# Patient Record
Sex: Male | Born: 1970 | Race: White | Hispanic: No | Smoking: Former smoker
Health system: Southern US, Community
[De-identification: ages and names within clinical notes are randomized; demographics above are authoritative.]

## PROBLEM LIST (undated history)

## (undated) DIAGNOSIS — I4729 Other ventricular tachycardia: Secondary | ICD-10-CM

## (undated) DIAGNOSIS — N289 Disorder of kidney and ureter, unspecified: Secondary | ICD-10-CM

## (undated) DIAGNOSIS — I25118 Atherosclerotic heart disease of native coronary artery with other forms of angina pectoris: Secondary | ICD-10-CM

## (undated) DIAGNOSIS — R55 Syncope and collapse: Secondary | ICD-10-CM

## (undated) DIAGNOSIS — I517 Cardiomegaly: Secondary | ICD-10-CM

## (undated) DIAGNOSIS — Z8614 Personal history of Methicillin resistant Staphylococcus aureus infection: Secondary | ICD-10-CM

## (undated) DIAGNOSIS — K7581 Nonalcoholic steatohepatitis (NASH): Secondary | ICD-10-CM

## (undated) DIAGNOSIS — K7460 Unspecified cirrhosis of liver: Secondary | ICD-10-CM

## (undated) DIAGNOSIS — I10 Essential (primary) hypertension: Secondary | ICD-10-CM

## (undated) DIAGNOSIS — R0600 Dyspnea, unspecified: Secondary | ICD-10-CM

## (undated) DIAGNOSIS — R0789 Other chest pain: Secondary | ICD-10-CM

## (undated) DIAGNOSIS — Z720 Tobacco use: Secondary | ICD-10-CM

## (undated) DIAGNOSIS — E66811 Obesity, class 1: Secondary | ICD-10-CM

## (undated) DIAGNOSIS — J449 Chronic obstructive pulmonary disease, unspecified: Secondary | ICD-10-CM

## (undated) DIAGNOSIS — I509 Heart failure, unspecified: Secondary | ICD-10-CM

## (undated) DIAGNOSIS — G4733 Obstructive sleep apnea (adult) (pediatric): Secondary | ICD-10-CM

## (undated) DIAGNOSIS — Z7901 Long term (current) use of anticoagulants: Secondary | ICD-10-CM

## (undated) DIAGNOSIS — R590 Localized enlarged lymph nodes: Secondary | ICD-10-CM

## (undated) DIAGNOSIS — I472 Ventricular tachycardia: Secondary | ICD-10-CM

## (undated) DIAGNOSIS — R519 Headache, unspecified: Secondary | ICD-10-CM

## (undated) DIAGNOSIS — I272 Pulmonary hypertension, unspecified: Secondary | ICD-10-CM

## (undated) DIAGNOSIS — I5020 Unspecified systolic (congestive) heart failure: Secondary | ICD-10-CM

## (undated) DIAGNOSIS — I209 Angina pectoris, unspecified: Secondary | ICD-10-CM

## (undated) DIAGNOSIS — I639 Cerebral infarction, unspecified: Secondary | ICD-10-CM

## (undated) DIAGNOSIS — J189 Pneumonia, unspecified organism: Secondary | ICD-10-CM

## (undated) DIAGNOSIS — K219 Gastro-esophageal reflux disease without esophagitis: Secondary | ICD-10-CM

## (undated) DIAGNOSIS — Z79899 Other long term (current) drug therapy: Secondary | ICD-10-CM

## (undated) DIAGNOSIS — Z7960 Long term (current) use of unspecified immunomodulators and immunosuppressants: Secondary | ICD-10-CM

## (undated) DIAGNOSIS — N183 Chronic kidney disease, stage 3 unspecified: Secondary | ICD-10-CM

## (undated) DIAGNOSIS — I2510 Atherosclerotic heart disease of native coronary artery without angina pectoris: Secondary | ICD-10-CM

## (undated) DIAGNOSIS — Z7982 Long term (current) use of aspirin: Secondary | ICD-10-CM

## (undated) DIAGNOSIS — I219 Acute myocardial infarction, unspecified: Secondary | ICD-10-CM

## (undated) DIAGNOSIS — R778 Other specified abnormalities of plasma proteins: Secondary | ICD-10-CM

## (undated) DIAGNOSIS — M75102 Unspecified rotator cuff tear or rupture of left shoulder, not specified as traumatic: Secondary | ICD-10-CM

## (undated) DIAGNOSIS — I4891 Unspecified atrial fibrillation: Secondary | ICD-10-CM

## (undated) DIAGNOSIS — Z87442 Personal history of urinary calculi: Secondary | ICD-10-CM

## (undated) DIAGNOSIS — Z95810 Presence of automatic (implantable) cardiac defibrillator: Secondary | ICD-10-CM

## (undated) DIAGNOSIS — R7989 Other specified abnormal findings of blood chemistry: Secondary | ICD-10-CM

## (undated) DIAGNOSIS — M1A00X0 Idiopathic chronic gout, unspecified site, without tophus (tophi): Secondary | ICD-10-CM

## (undated) DIAGNOSIS — E119 Type 2 diabetes mellitus without complications: Secondary | ICD-10-CM

## (undated) DIAGNOSIS — I428 Other cardiomyopathies: Secondary | ICD-10-CM

## (undated) DIAGNOSIS — L409 Psoriasis, unspecified: Secondary | ICD-10-CM

## (undated) DIAGNOSIS — K7469 Other cirrhosis of liver: Secondary | ICD-10-CM

## (undated) DIAGNOSIS — I5022 Chronic systolic (congestive) heart failure: Secondary | ICD-10-CM

## (undated) DIAGNOSIS — D696 Thrombocytopenia, unspecified: Secondary | ICD-10-CM

## (undated) DIAGNOSIS — E785 Hyperlipidemia, unspecified: Secondary | ICD-10-CM

## (undated) DIAGNOSIS — I700 Atherosclerosis of aorta: Secondary | ICD-10-CM

## (undated) DIAGNOSIS — E669 Obesity, unspecified: Secondary | ICD-10-CM

## (undated) DIAGNOSIS — I6521 Occlusion and stenosis of right carotid artery: Secondary | ICD-10-CM

## (undated) DIAGNOSIS — D751 Secondary polycythemia: Secondary | ICD-10-CM

## (undated) DIAGNOSIS — I340 Nonrheumatic mitral (valve) insufficiency: Secondary | ICD-10-CM

## (undated) DIAGNOSIS — L4050 Arthropathic psoriasis, unspecified: Secondary | ICD-10-CM

## (undated) DIAGNOSIS — G8929 Other chronic pain: Secondary | ICD-10-CM

## (undated) DIAGNOSIS — F32A Depression, unspecified: Secondary | ICD-10-CM

## (undated) DIAGNOSIS — K635 Polyp of colon: Secondary | ICD-10-CM

## (undated) HISTORY — DX: Unspecified systolic (congestive) heart failure: I50.20

## (undated) HISTORY — PX: CARDIAC CATHETERIZATION: SHX172

## (undated) HISTORY — DX: Acute myocardial infarction, unspecified: I21.9

## (undated) HISTORY — PX: FRACTURE SURGERY: SHX138

## (undated) HISTORY — PX: FINGER FRACTURE SURGERY: SHX638

## (undated) HISTORY — PX: AMPUTATION: SHX166

## (undated) HISTORY — PX: OTHER SURGICAL HISTORY: SHX169

## (undated) HISTORY — PX: FINGER AMPUTATION: SHX636

---

## 2003-09-20 ENCOUNTER — Other Ambulatory Visit: Payer: Self-pay

## 2004-03-14 ENCOUNTER — Other Ambulatory Visit: Payer: Self-pay

## 2005-11-18 ENCOUNTER — Other Ambulatory Visit: Payer: Self-pay

## 2005-11-18 ENCOUNTER — Emergency Department: Payer: Self-pay | Admitting: General Practice

## 2007-03-17 ENCOUNTER — Emergency Department: Payer: Self-pay | Admitting: Emergency Medicine

## 2007-05-12 IMAGING — CT CT HEAD WITHOUT CONTRAST
2 series · 16 of 30 positions shown, 20 images · non-contrast
Comparison: none

REASON FOR EXAM: Headache/[HOSPITAL].
COMMENTS:

[Series 2: without · axial · non-contrast · 0.42mm/px · z∈[+1155,+1290]mm · 13 of 33 slices shown, 17 images]
[im 3/33  brain]
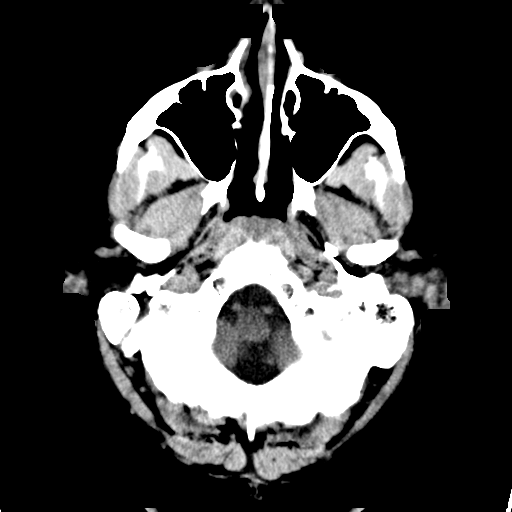
[im 3/33  bone]
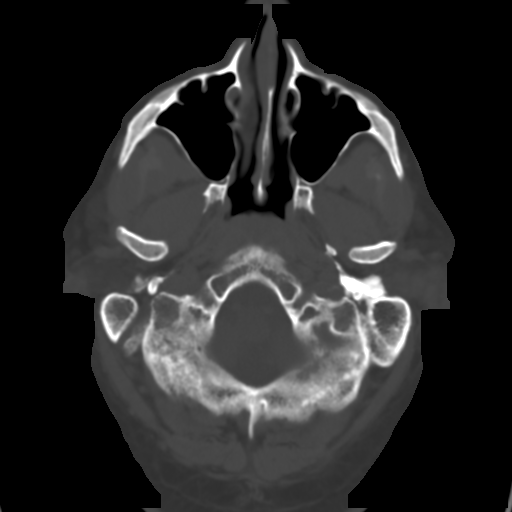
[im 5/33  brain]
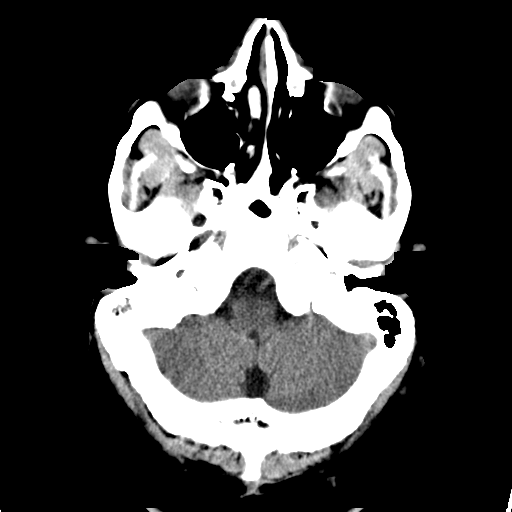
[im 7/33  brain]
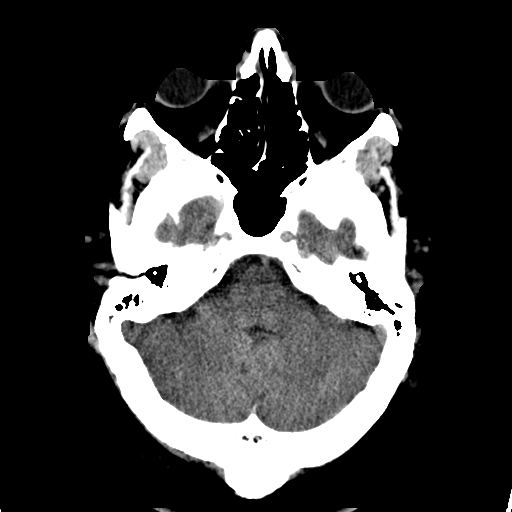
[im 10/33  brain]
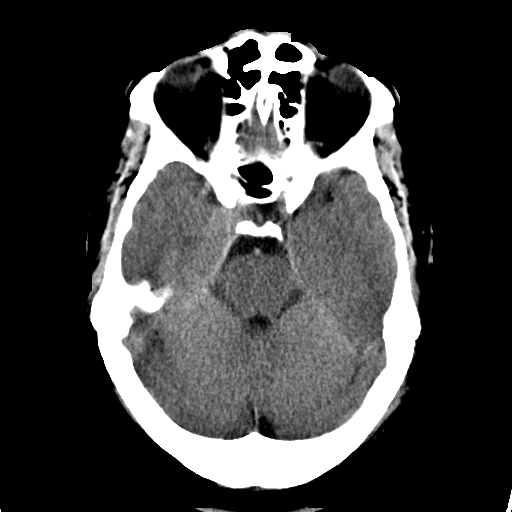
[im 12/33  brain]
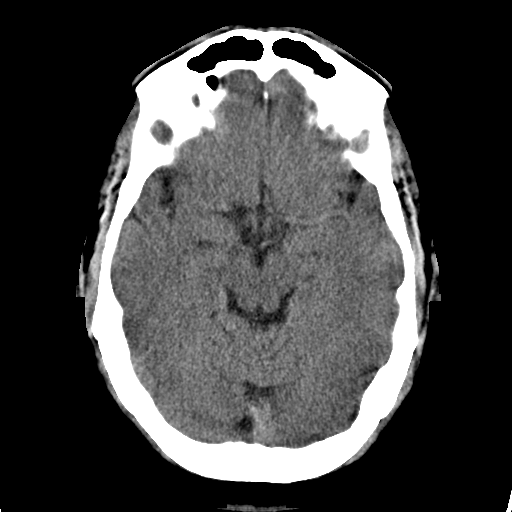
[im 12/33  bone]
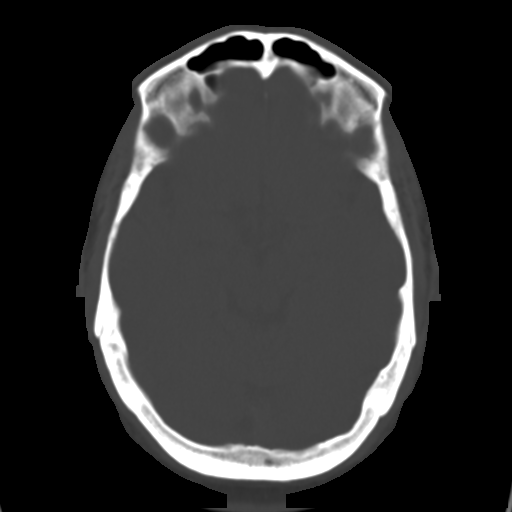
[im 14/33  brain]
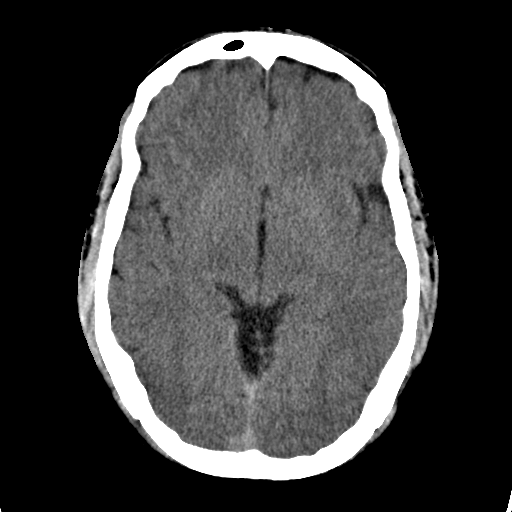
[im 17/33  brain]
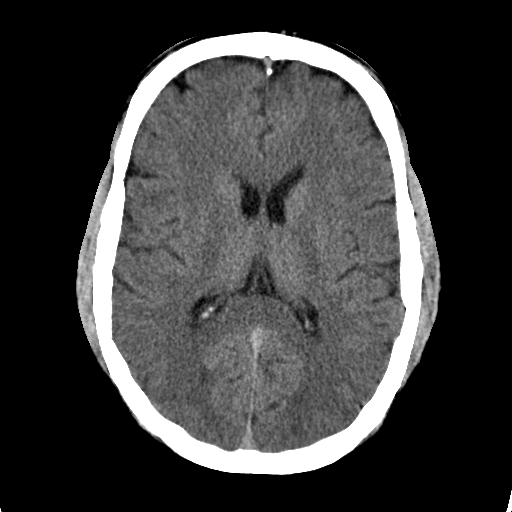
[im 19/33  brain]
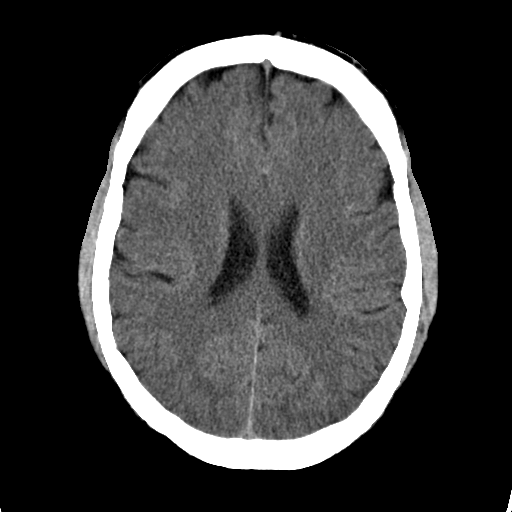
[im 21/33  brain]
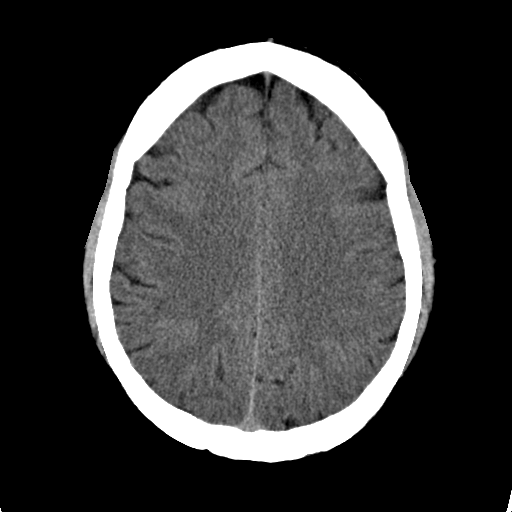
[im 21/33  bone]
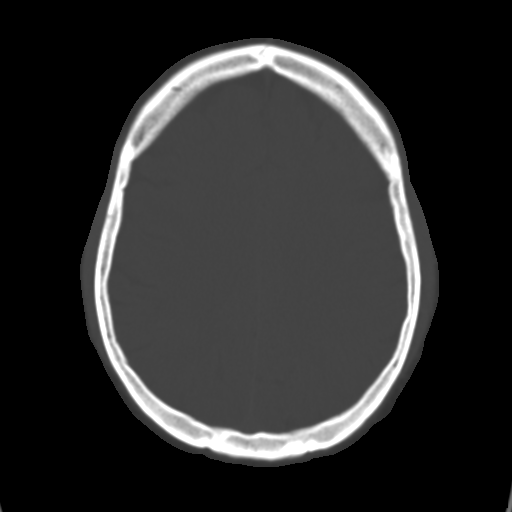
[im 23/33  brain]
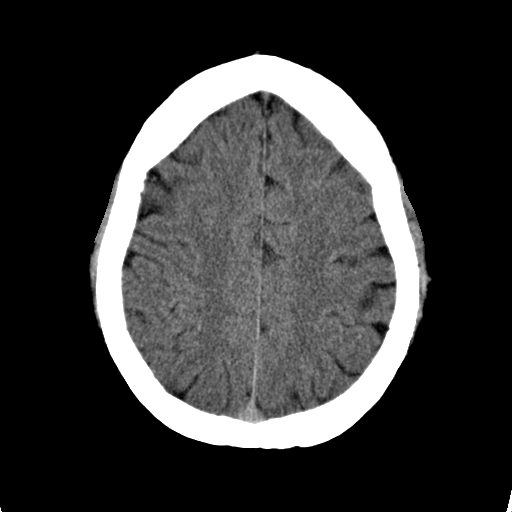
[im 26/33  brain]
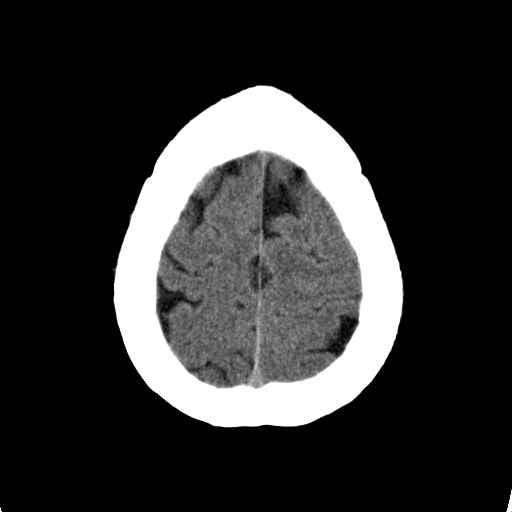
[im 28/33  brain]
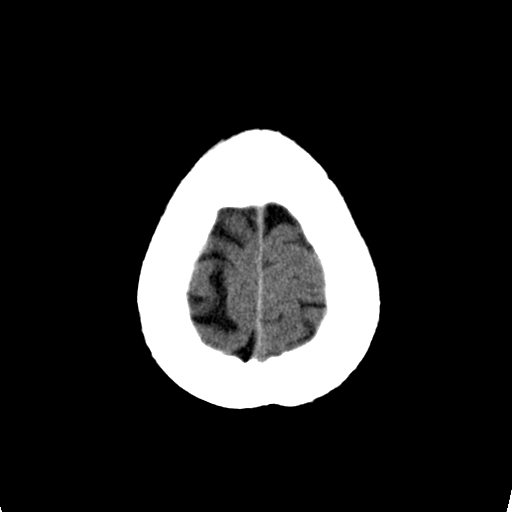
[im 30/33  brain]
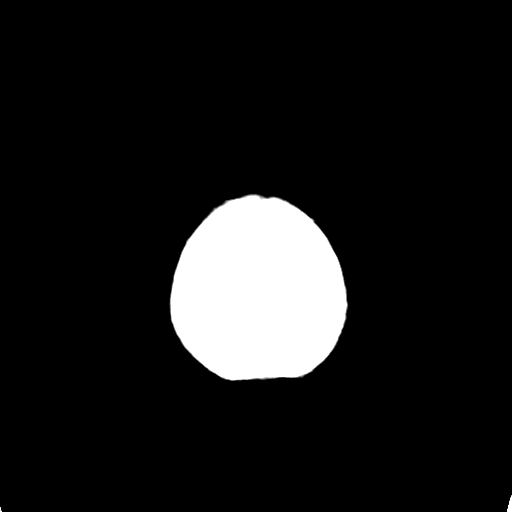
[im 30/33  bone]
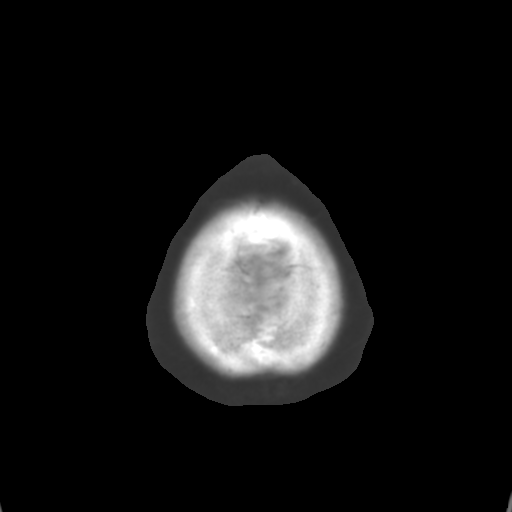

[Series 3: bone · axial · 0.42mm/px · z∈[+1155,+1200]mm · 3 of 33 slices shown]
[im 3/33  bone]
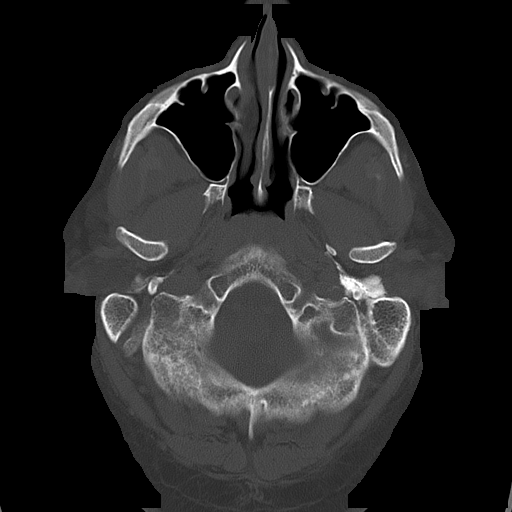
[im 7/33  bone]
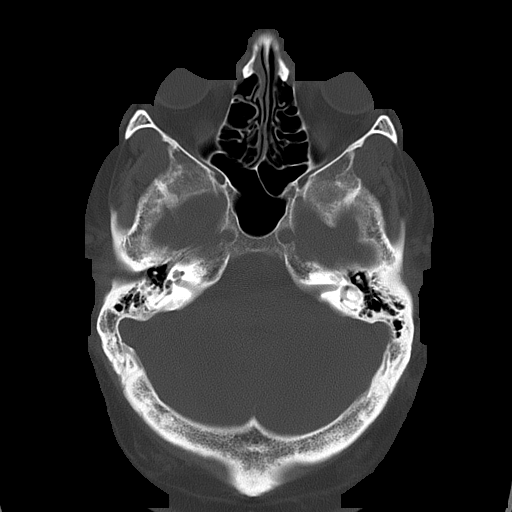
[im 12/33  bone]
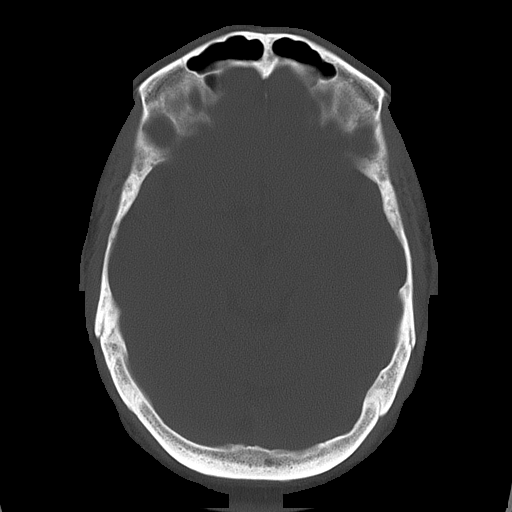

[16 of 30 positions shown; findings below may reference images not displayed]

PROCEDURE:     CT  - CT HEAD WITHOUT CONTRAST  - November 18, 2005 [DATE]

RESULT:        The patient is complaining of headache with nausea.

The ventricles are normal in size and position.   There is no evidence of a
mass nor mass effect.  There is no evidence of an intracranial hemorrhage.
There is soft tissue density in the RIGHT maxillary sinus most compatible
with a retention cyst.
IMPRESSION: I see no acute intracranial abnormality.

A preliminary report was sent to the Emergency Room at the conclusion of the
study.

## 2007-10-13 ENCOUNTER — Other Ambulatory Visit: Payer: Self-pay

## 2007-10-13 ENCOUNTER — Inpatient Hospital Stay: Payer: Self-pay | Admitting: Internal Medicine

## 2007-10-14 ENCOUNTER — Ambulatory Visit: Payer: Self-pay | Admitting: Cardiology

## 2007-10-15 ENCOUNTER — Other Ambulatory Visit: Payer: Self-pay

## 2008-04-09 ENCOUNTER — Emergency Department: Payer: Self-pay | Admitting: Internal Medicine

## 2008-08-03 ENCOUNTER — Emergency Department: Payer: Self-pay | Admitting: Emergency Medicine

## 2008-08-12 ENCOUNTER — Emergency Department: Payer: Self-pay

## 2008-09-08 IMAGING — CT CT STONE STUDY
1 of 2 series · 15 of 32 positions shown, 19 images · non-contrast
Comparison: none

REASON FOR EXAM: rm 16    abd pain/stone study
COMMENTS:

[Series 2: stone · axial · 0.93mm/px · z∈[-558,-78]mm · 15 of 174 slices shown, 19 images]
[im 7/174  soft-tissue]
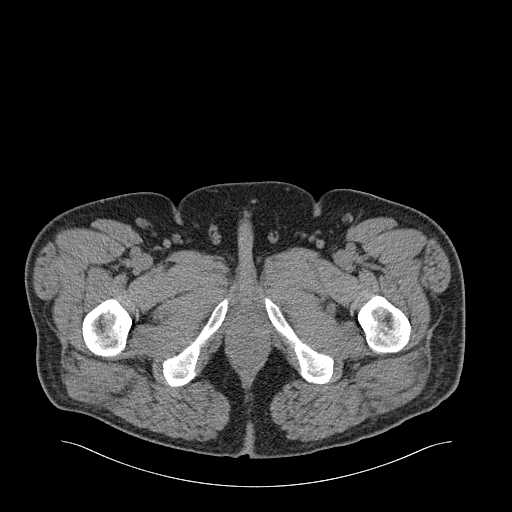
[im 7/174  bone]
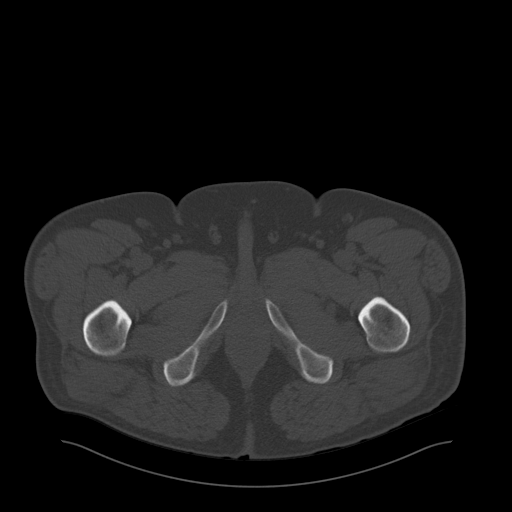
[im 21/174  soft-tissue]
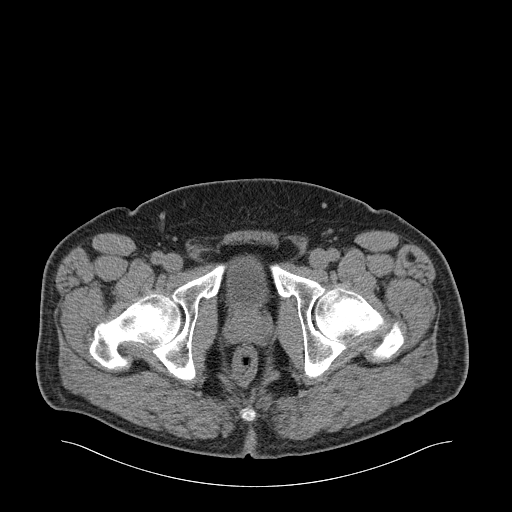
[im 35/174  soft-tissue]
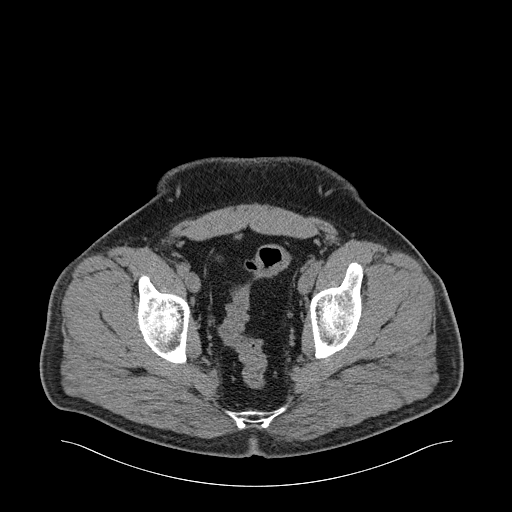
[im 49/174  soft-tissue]
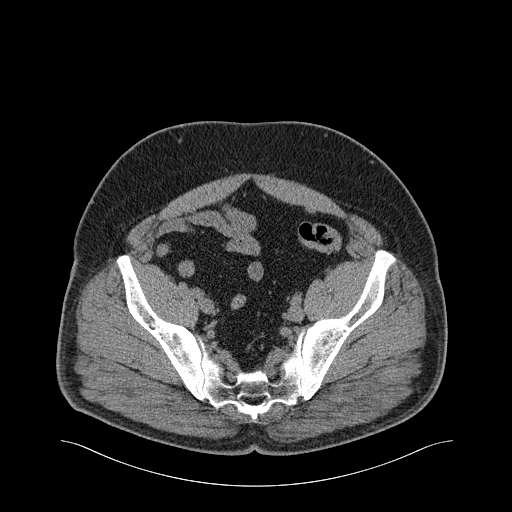
[im 63/174  soft-tissue]
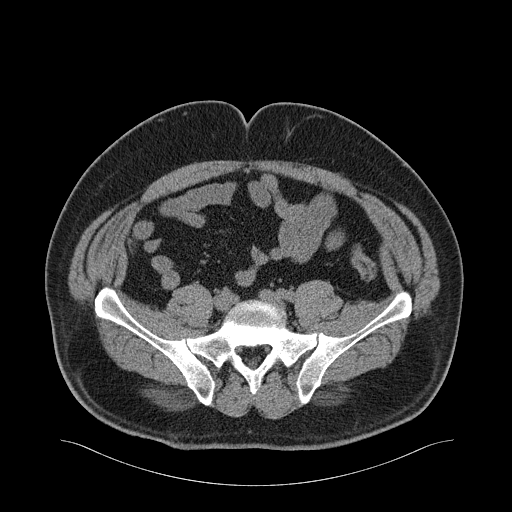
[im 77/174  soft-tissue]
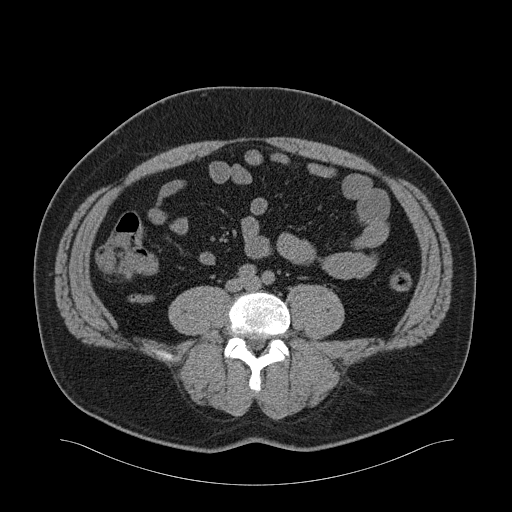
[im 90/174  soft-tissue]
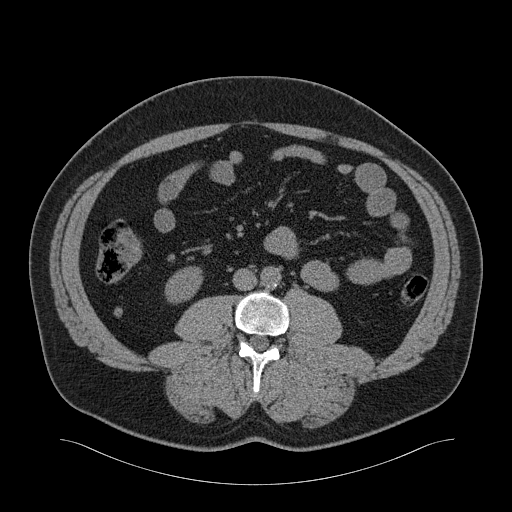
[im 97/174  soft-tissue]
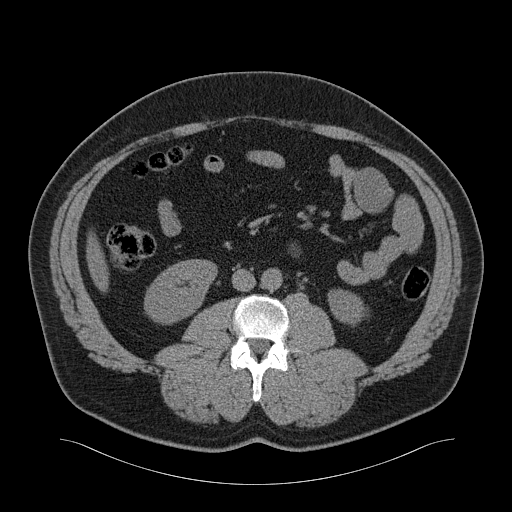
[im 111/174  soft-tissue]
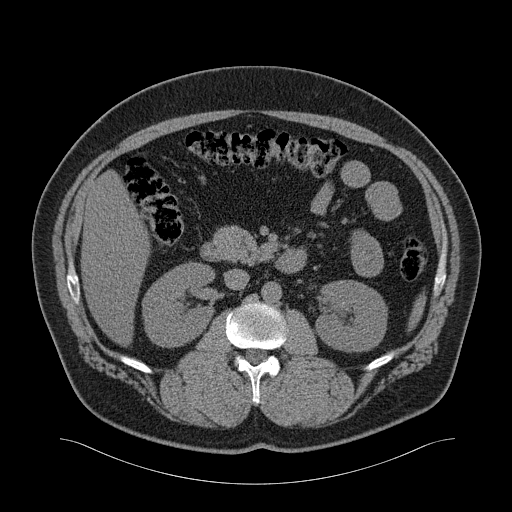
[im 111/174  bone]
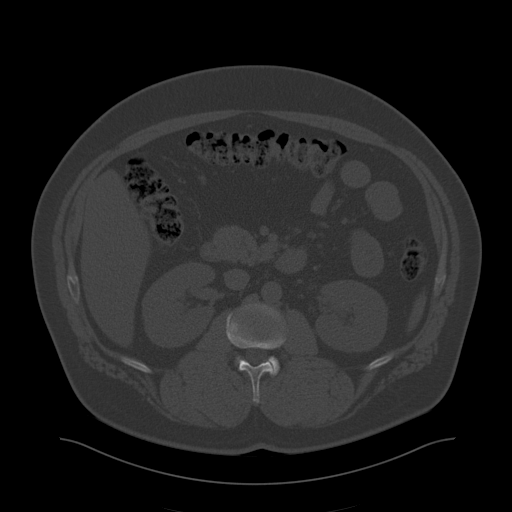
[im 125/174  soft-tissue]
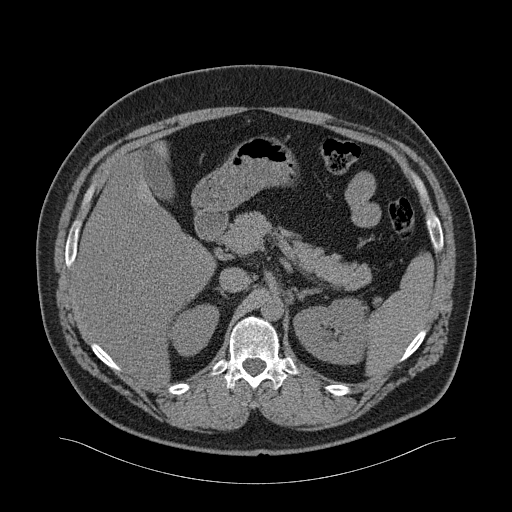
[im 139/174  soft-tissue]
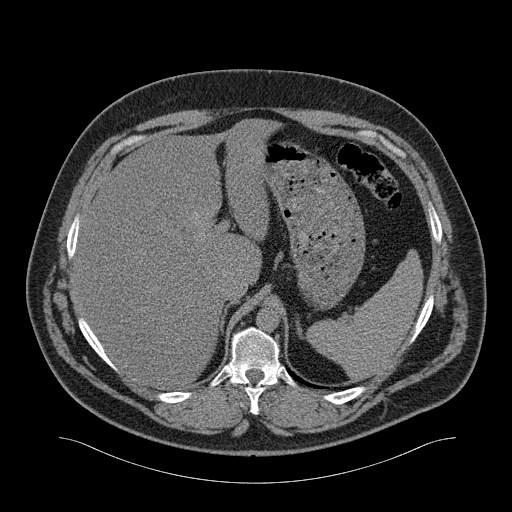
[im 146/174  lung]
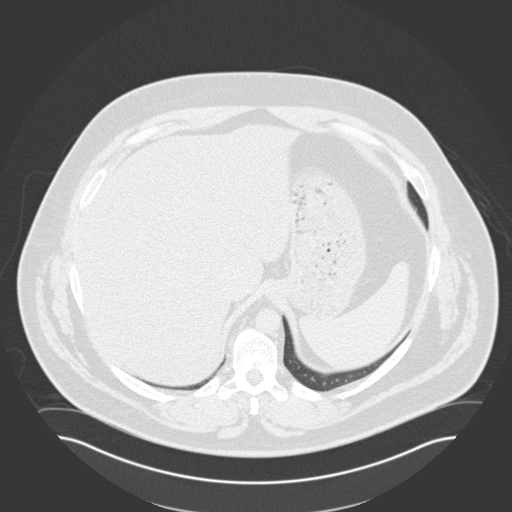
[im 153/174  soft-tissue]
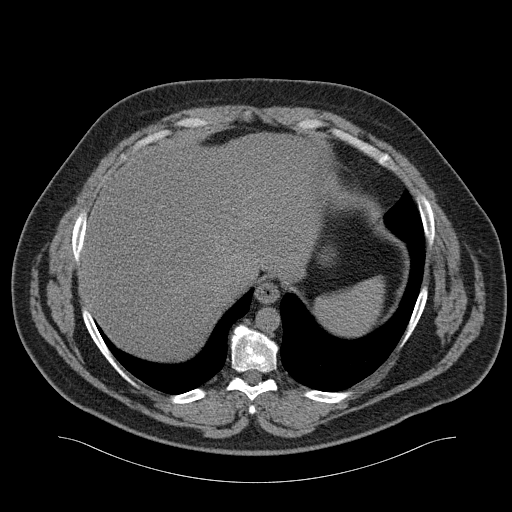
[im 153/174  lung]
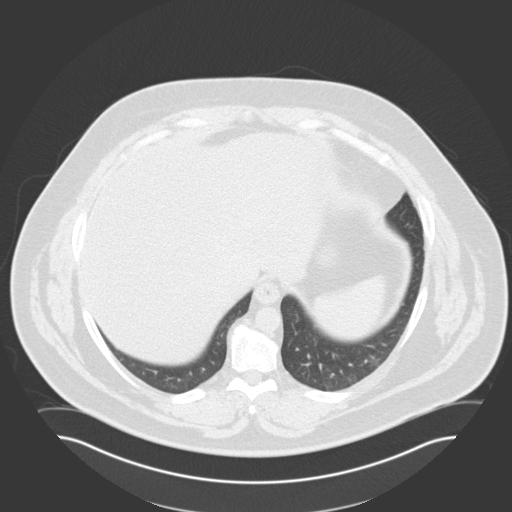
[im 160/174  lung]
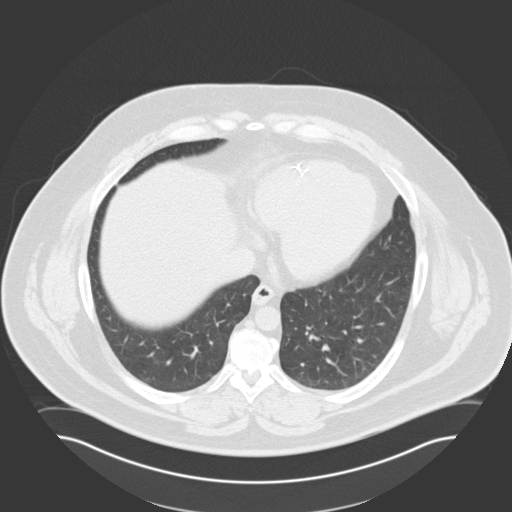
[im 167/174  soft-tissue]
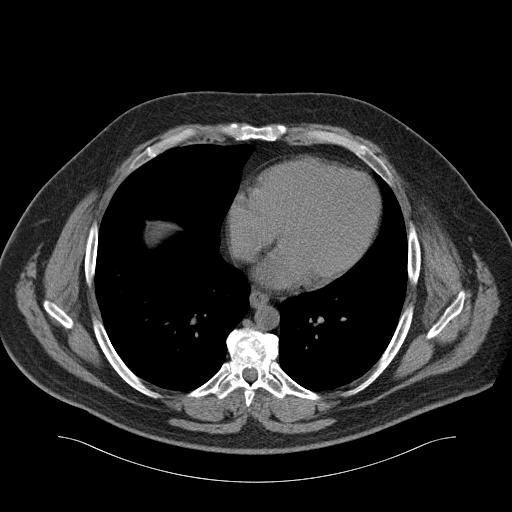
[im 167/174  lung]
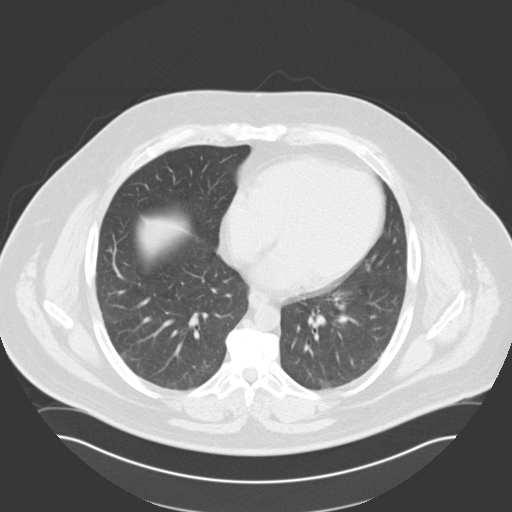

[15 of 32 positions shown; findings below may reference images not displayed]

PROCEDURE:     CT  - CT ABDOMEN /PELVIS WO (STONE)  - March 18, 2007  [DATE]

RESULT:     The study was tailored to evaluate the patient for possible
urinary tract obstruction. The patient received no IV contrast for this
study.

The liver, spleen, stomach, adrenal glands, gallbladder, pancreas, and
kidneys are normal in appearance. There is no evidence of obstruction of
either kidney or of renal masses. The paraaortic and pericaval regions are
also normal in appearance. The appendix is identified and is normal in
appearance. The small and large bowel loops exhibit no acute abnormality
otherwise either. There is no free fluid in the pelvis. The urinary bladder
is normal in appearance. The caliber of the abdominal aorta is normal. The
lung bases are clear.
IMPRESSION: 1. There is no evidence of urinary tract obstruction or calcified urinary
tract stones.
2. There is no evidence of acute hepatobiliary abnormality.
3. No acute bowel abnormality is identified.

If patient's symptoms persist and remain unexplained, a followup
contrast-enhanced CT scan would be a useful next step.

## 2008-11-22 ENCOUNTER — Emergency Department (HOSPITAL_COMMUNITY): Admission: EM | Admit: 2008-11-22 | Discharge: 2008-11-22 | Payer: Self-pay | Admitting: Emergency Medicine

## 2009-04-05 IMAGING — US ABDOMEN ULTRASOUND
1 series · 17 of 25 positions shown · non-contrast
Comparison: none

REASON FOR EXAM: Rule out gallstones
COMMENTS:

[Series 1: abdomen ultrasound · 17 of 51 slices shown]
[im 1/51]
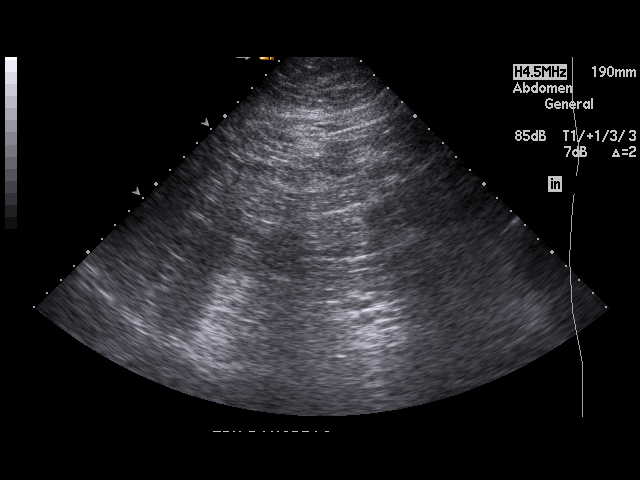
[im 5/51]
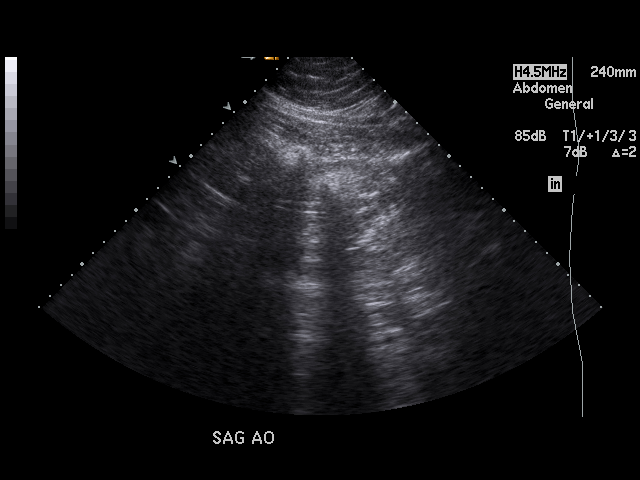
[im 7/51]
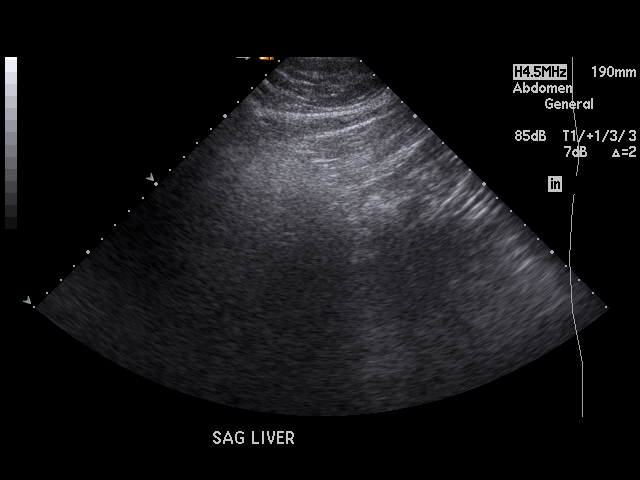
[im 11/51]
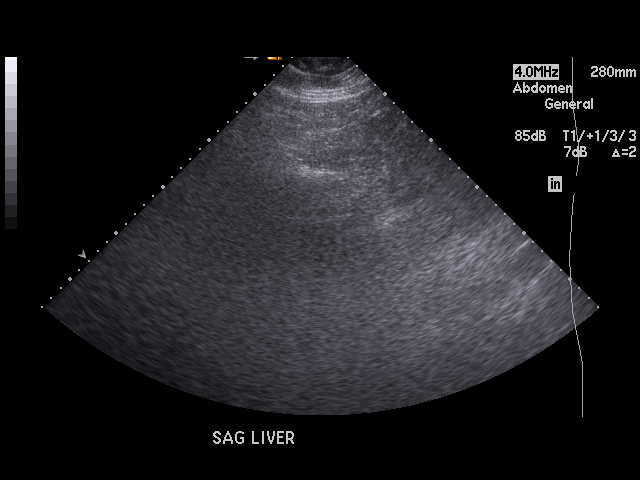
[im 13/51]
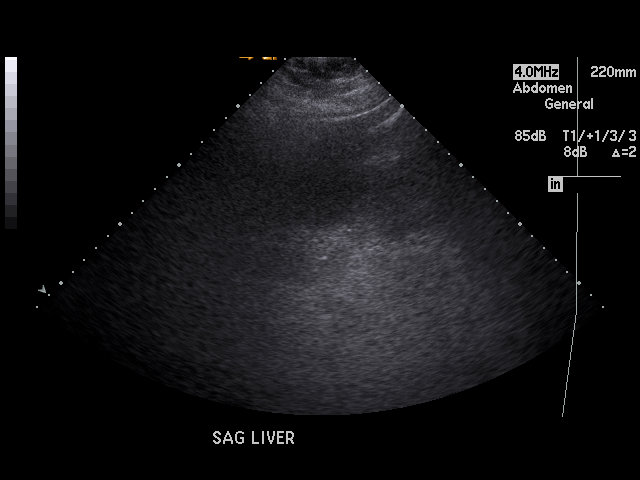
[im 17/51]
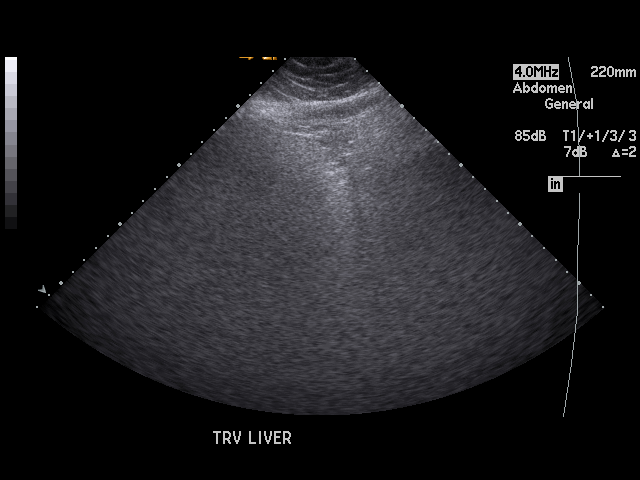
[im 19/51]
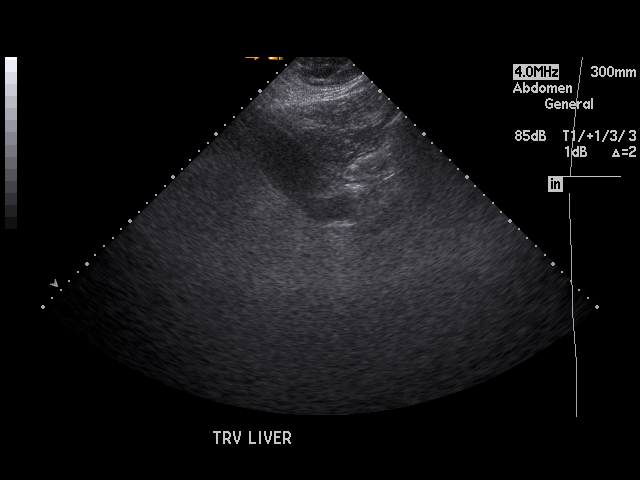
[im 23/51]
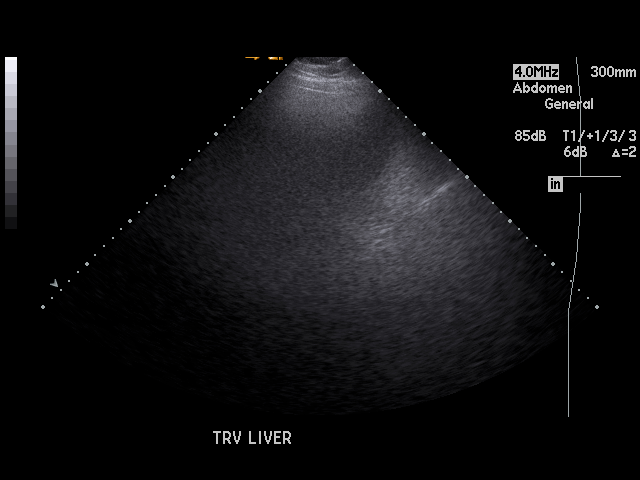
[im 26/51]
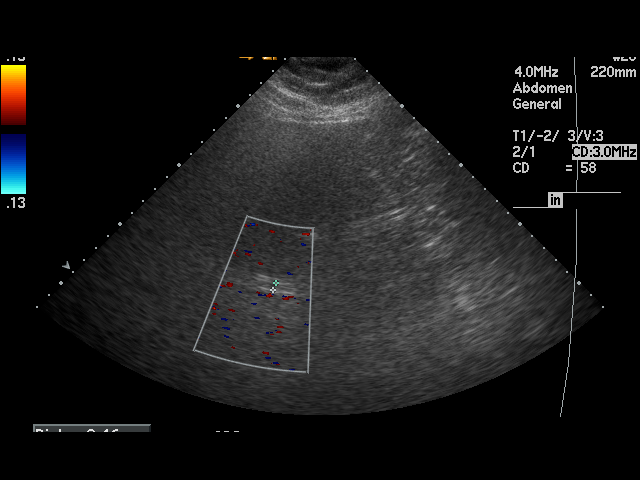
[im 28/51]
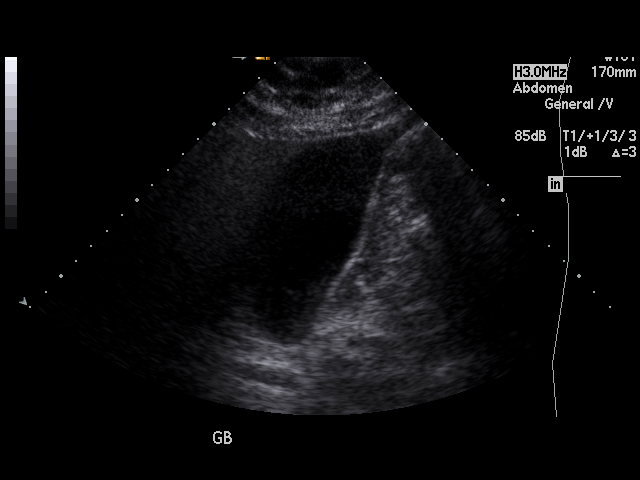
[im 32/51]
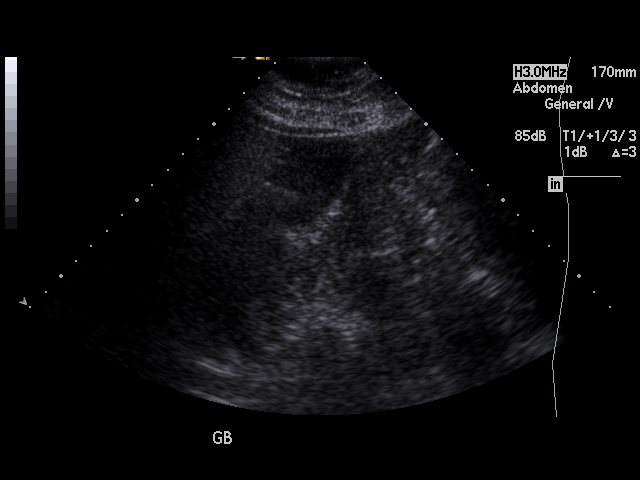
[im 34/51]
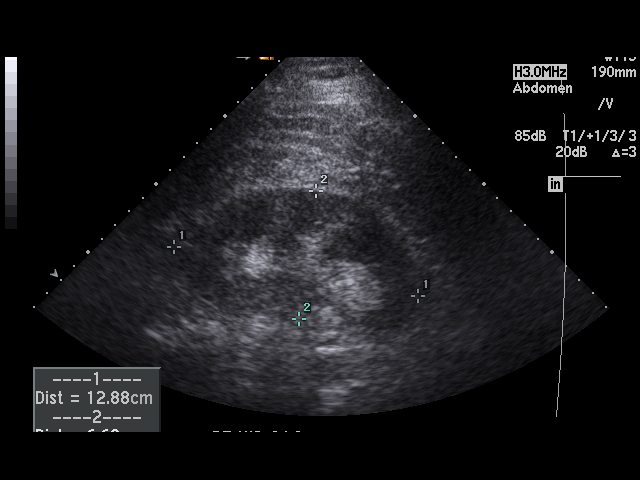
[im 38/51]
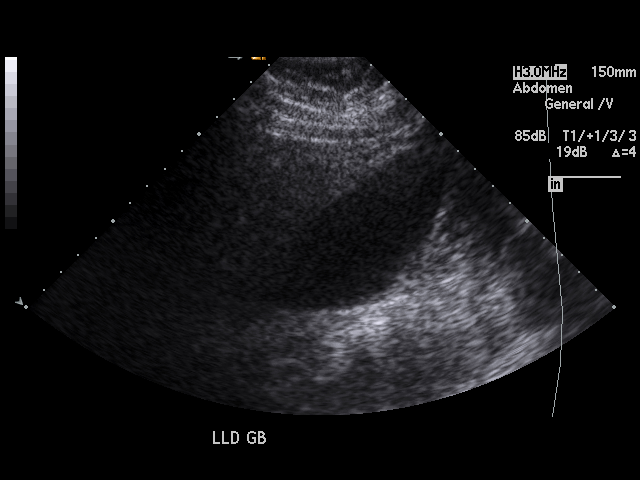
[im 40/51]
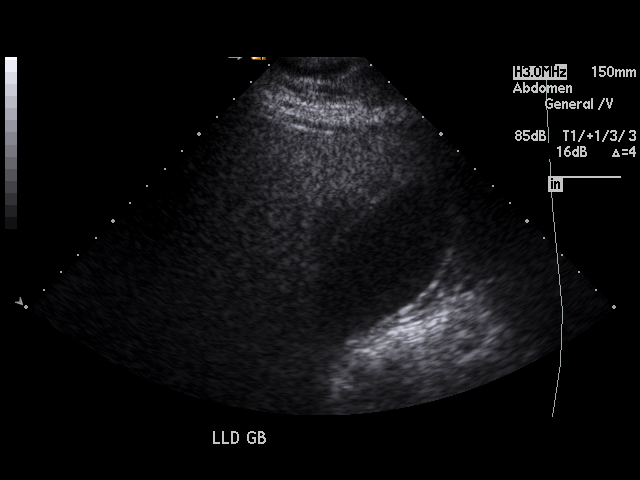
[im 44/51]
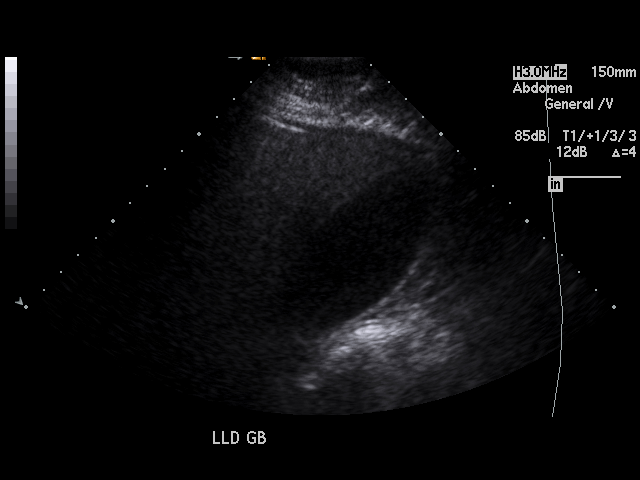
[im 46/51]
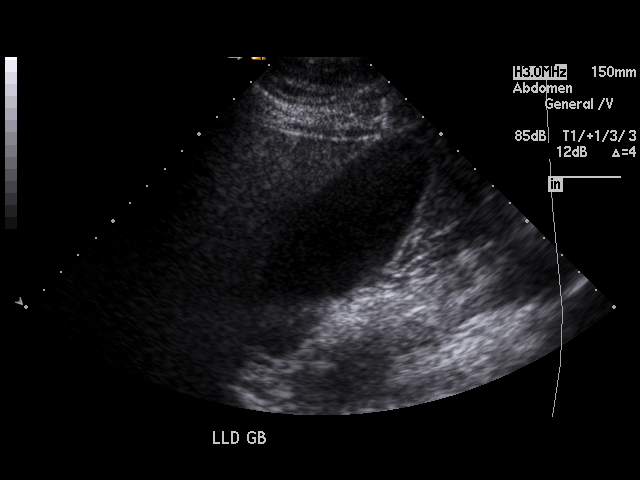
[im 51/51]
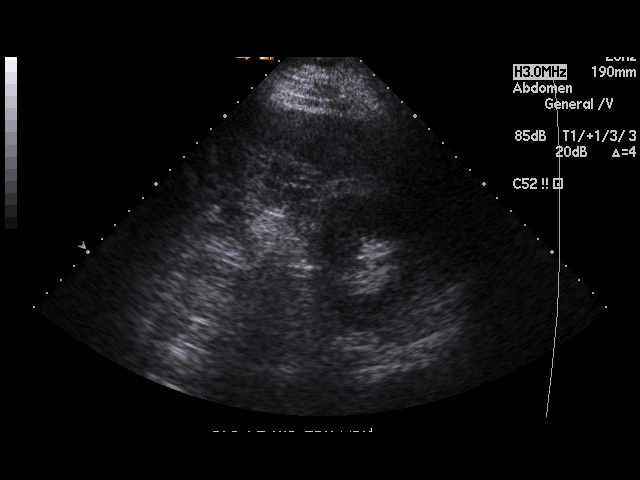

[17 of 25 positions shown; findings below may reference images not displayed]

PROCEDURE:     US  - US ABDOMEN GENERAL SURVEY  - October 13, 2007  [DATE]

RESULT:     The study is markedly limited secondary to the patient's body
habitus. The patient is morbidly obese.

The liver demonstrates no gross sonographic abnormalities though it is
poorly visualized. Hepatopetal flow is demonstrated within the portal vein.
The aorta and IVC are not visualized. The pancreas is not visualized.
Evaluation of the gallbladder demonstrates no evidence of pericholecystic
fluid, gallstones, sludging or sonographic Murphy's sign. Gallbladder wall
thickness is 1.9 mm. The common bile duct measures 4.6 mm in diameter. The
RIGHT and LEFT kidneys are unremarkable demonstrating appropriate
corticomedullary differentiation without evidence of hydronephrosis, masses
or calculi. The RIGHT kidney measures 12.88 x 6.68 x 6.47 cm. The LEFT
kidney measures 11.29 x 5.91 x 4.94 cm. Evaluation of the spleen
demonstrates longitudinal dimension of 13.37 cm which is within normal
limits considering the patient's body habitus.
IMPRESSION: Limited Abdominal Ultrasound which demonstrates no
sonographic evidence of acute cholecystitis.

## 2009-04-05 IMAGING — CR DG CHEST 1V PORT
1 series · 1 of 1 positions shown · non-contrast
Comparison: none

REASON FOR EXAM: Chest pain
COMMENTS:

PROCEDURE:     DXR - DXR PORTABLE CHEST SINGLE VIEW  - October 13, 2007  [DATE]
RESULT:     There is RIGHT basilar infiltrate or atelectasis. Cardiac
monitoring electrodes are present. The cardiac silhouette appears to be
borderline to mildly enlarged. There is no effusion or pneumothorax.

[view not recorded]
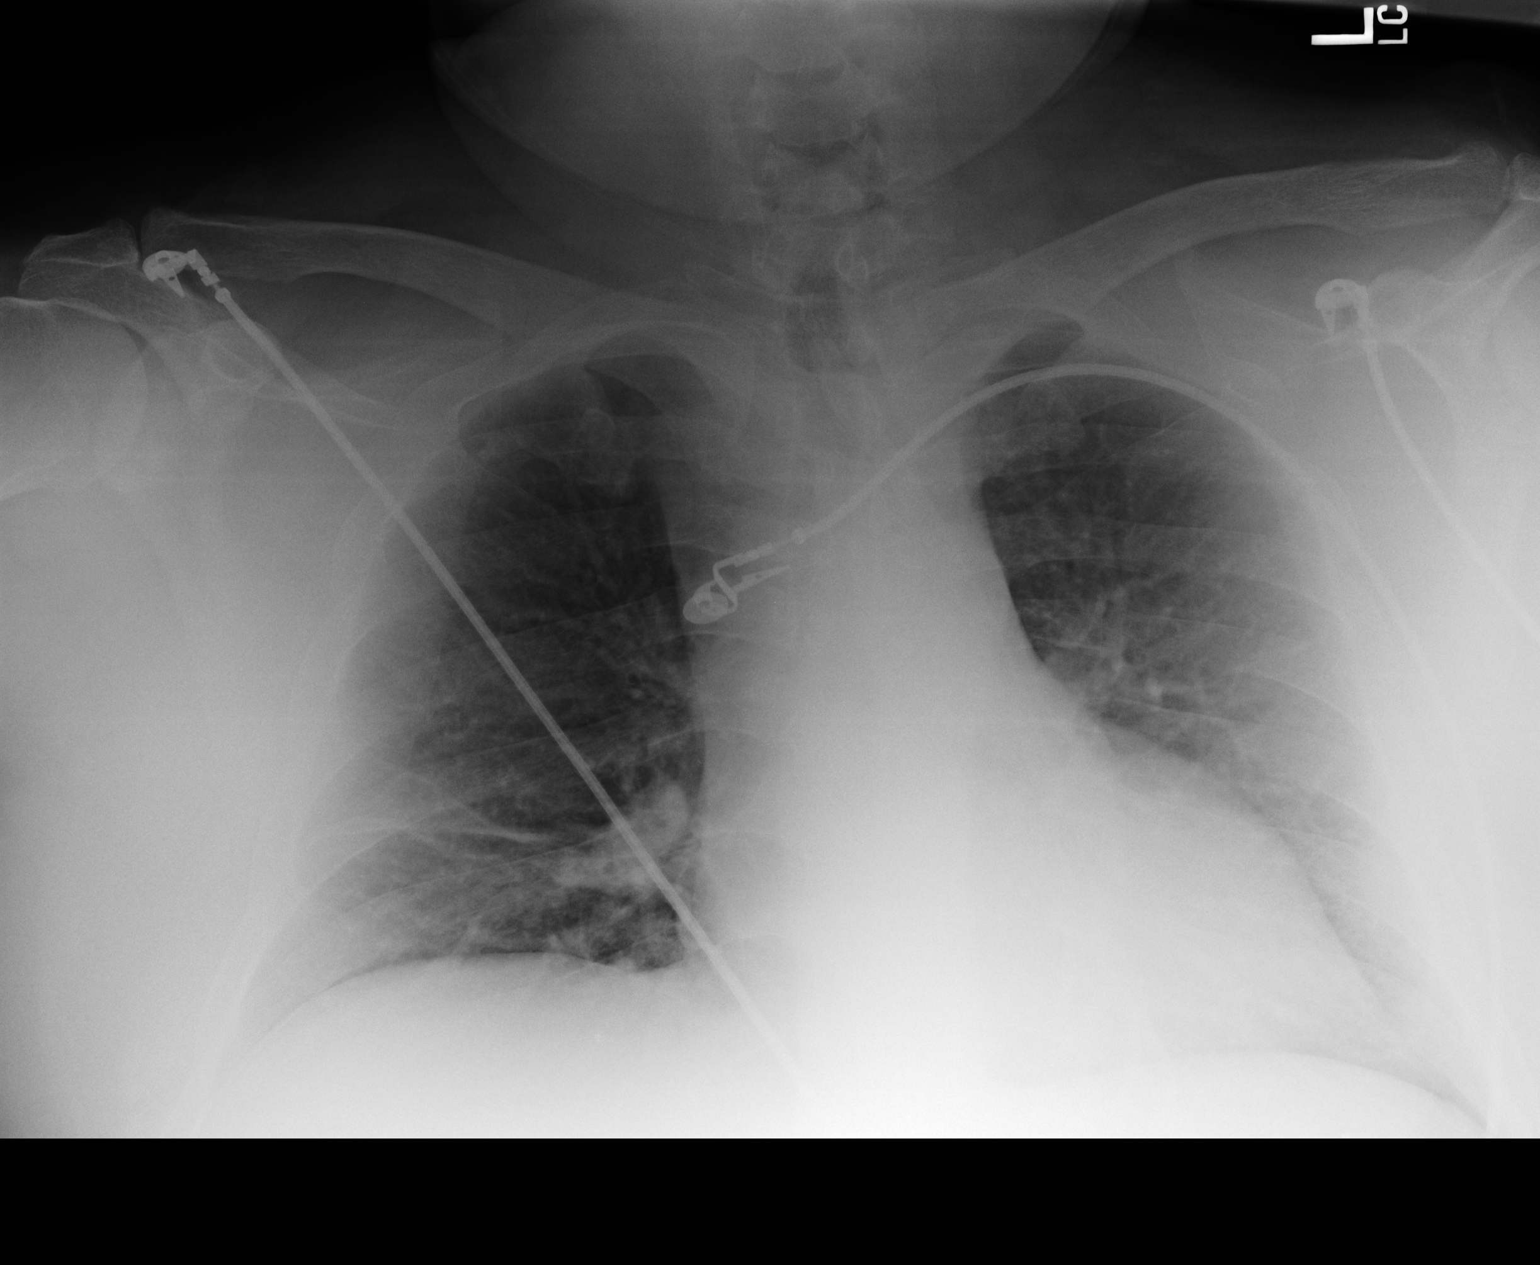

[1 of 1 positions shown; findings below may reference images not displayed]

IMPRESSION: 1.     RIGHT basilar atelectasis.
2.     Borderline cardiomegaly.

## 2009-04-06 IMAGING — CT CT ABD-PELV W/ CM
1 of 2 series · 15 of 32 positions shown, 19 images · non-contrast
Comparison: none

REASON FOR EXAM: (1) RUq pain, elevated lipase; (2) same
COMMENTS:

[Series 2: abdomen · axial · 0.95mm/px · z∈[-1110,-590]mm · 15 of 71 slices shown, 19 images]
[im 3/71  soft-tissue]
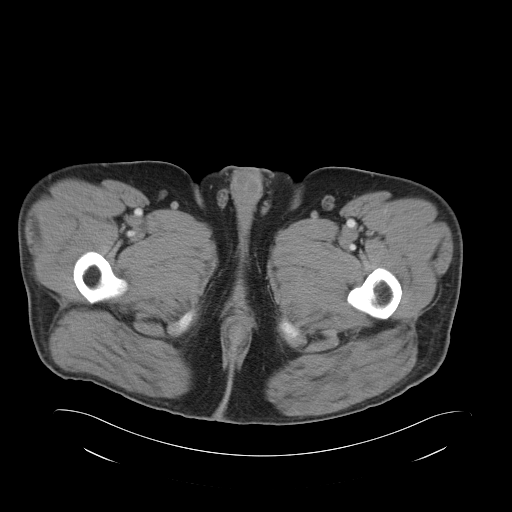
[im 3/71  bone]
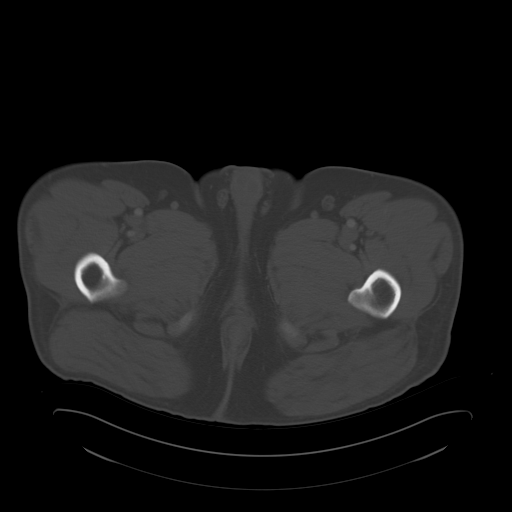
[im 9/71  soft-tissue]
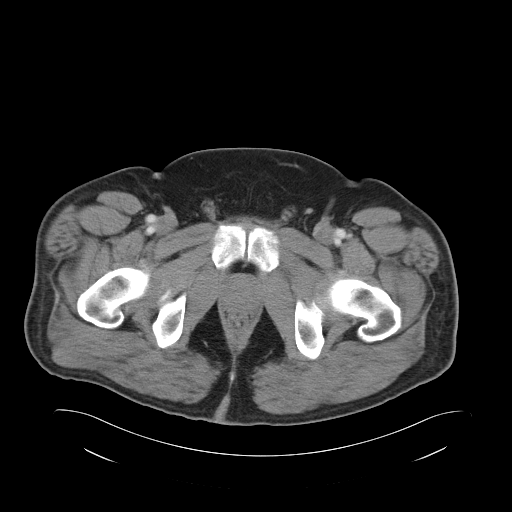
[im 15/71  soft-tissue]
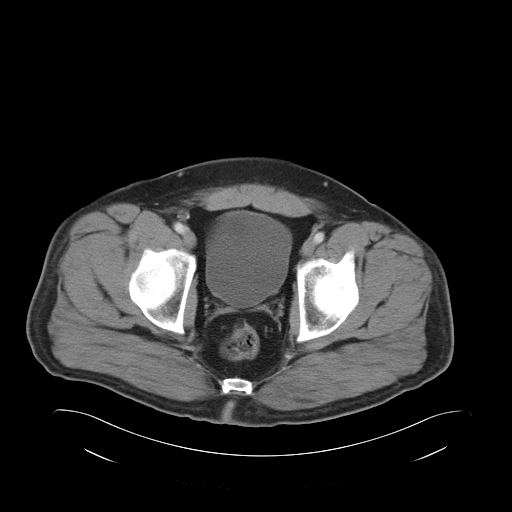
[im 21/71  soft-tissue]
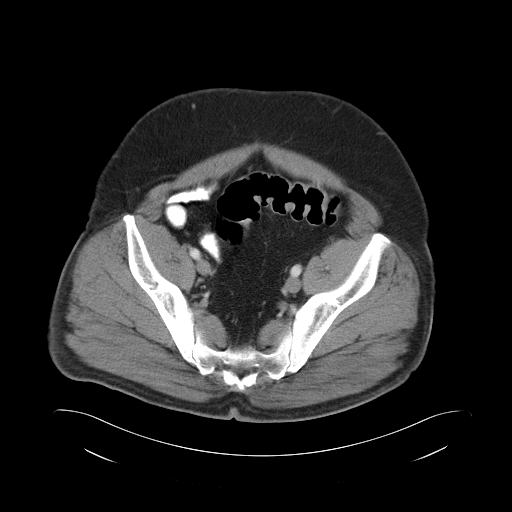
[im 24/71  soft-tissue]
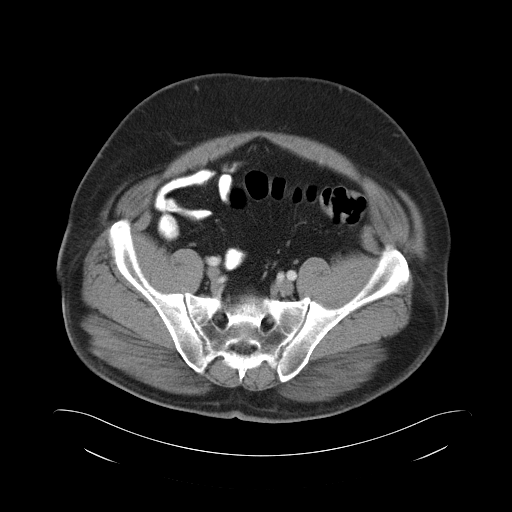
[im 30/71  soft-tissue]
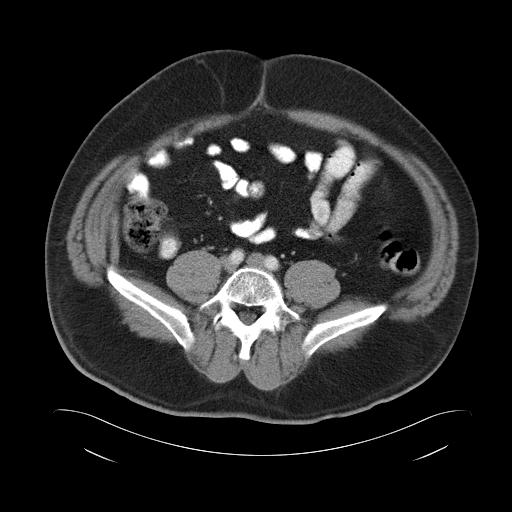
[im 36/71  soft-tissue]
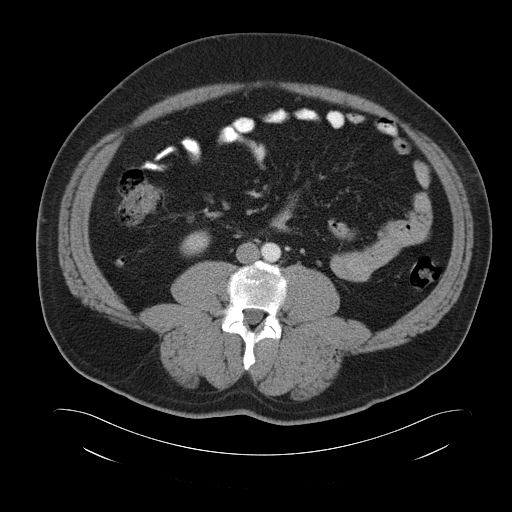
[im 41/71  soft-tissue]
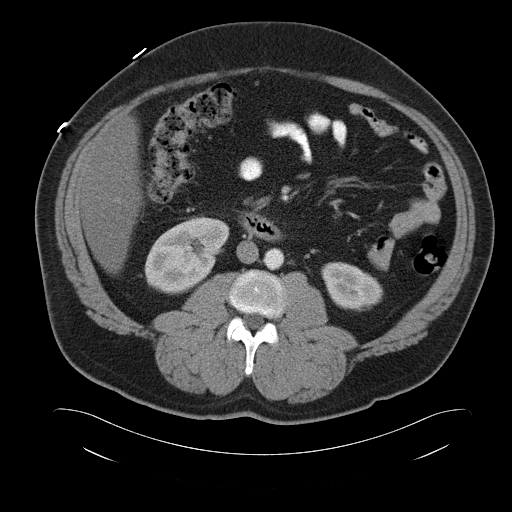
[im 47/71  soft-tissue]
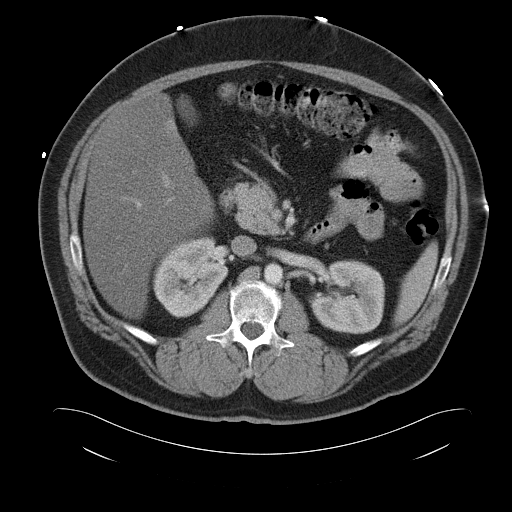
[im 47/71  bone]
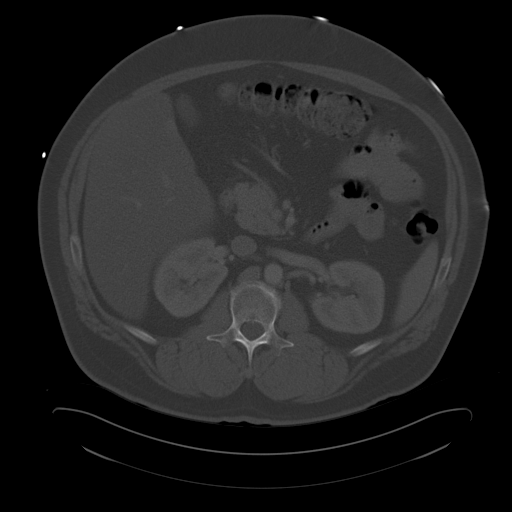
[im 50/71  soft-tissue]
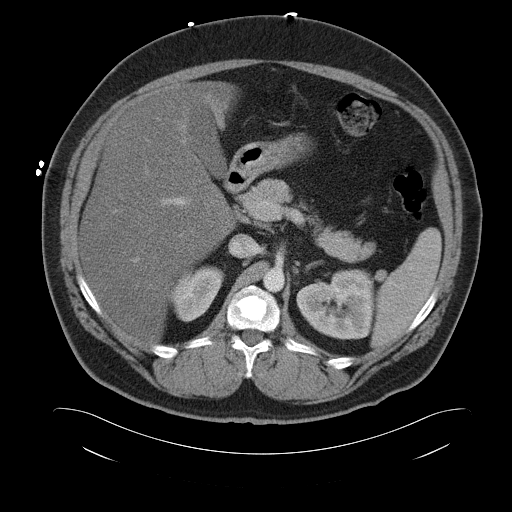
[im 56/71  soft-tissue]
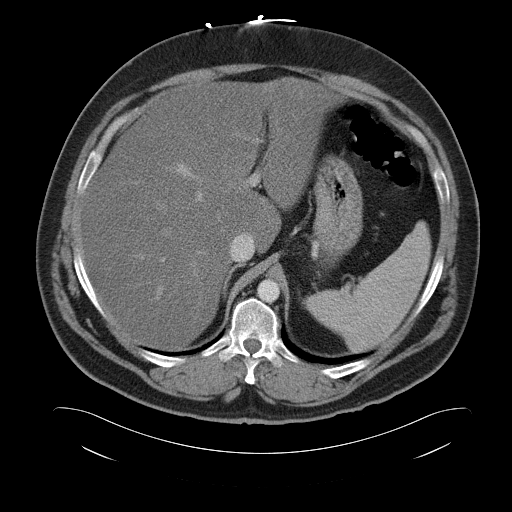
[im 59/71  lung]
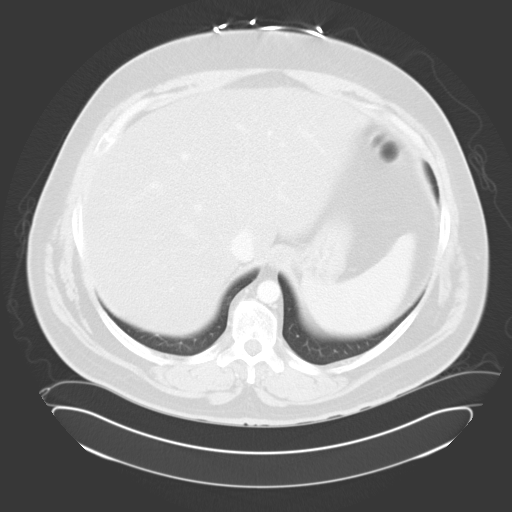
[im 62/71  soft-tissue]
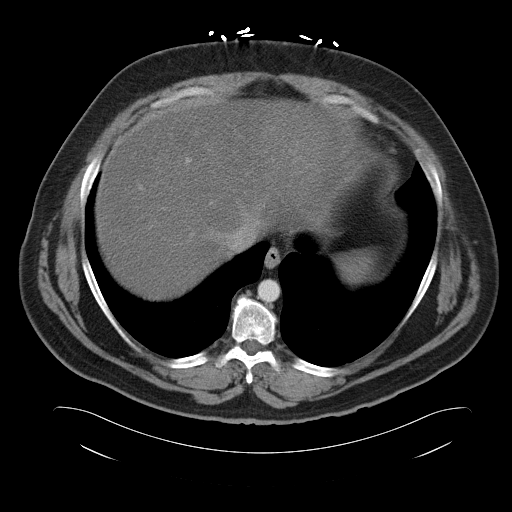
[im 62/71  lung]
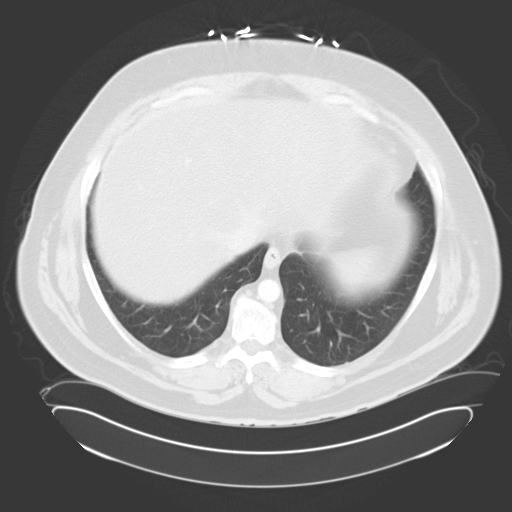
[im 65/71  lung]
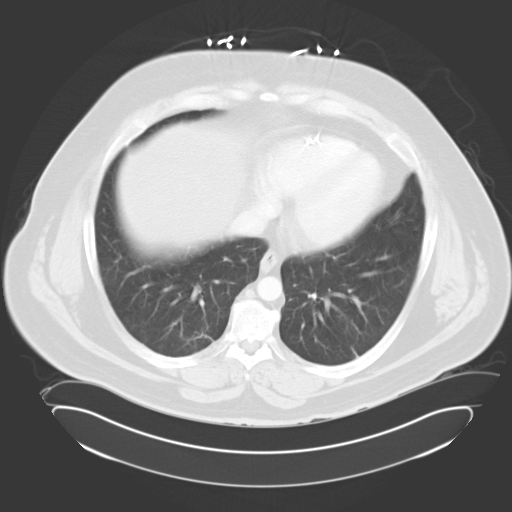
[im 68/71  soft-tissue]
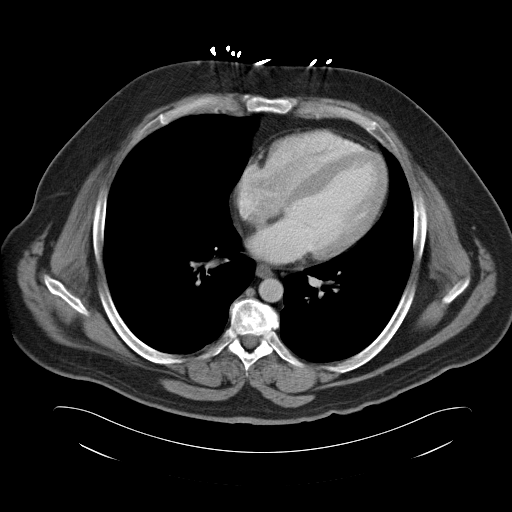
[im 68/71  lung]
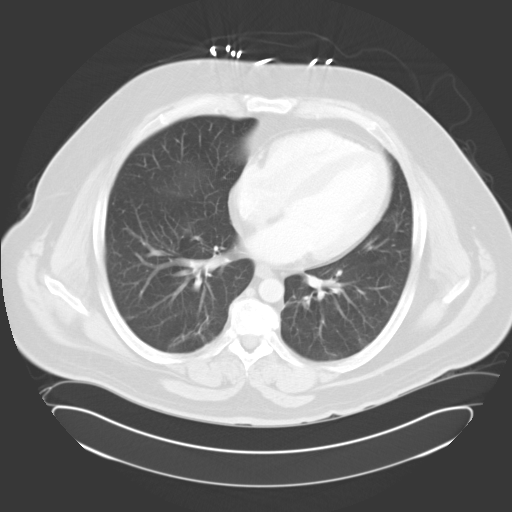

[15 of 32 positions shown; findings below may reference images not displayed]

PROCEDURE:     CT  - CT ABDOMEN / PELVIS  W  - October 14, 2007  [DATE]

RESULT:     The patient received 100 cc of [id number] intravenously for this
study as well as oral contrast material.

There is subtle increased density surrounding the pancreatic head in the
peripancreatic fat. This may reflect early inflammatory change. The
pancreatic body and tail are normal in appearance. There is no pancreatic
ductal dilation. The liver exhibits decreased density diffusely consistent
with fatty infiltration. The gallbladder is adequately distended. I do not
see evidence of calcified gallstones. The spleen, nondistended stomach,
adrenal glands, and kidneys are normal in appearance. The appendix is
demonstrated and is normal in appearance. The partially contrast-filled
loops of small bowel are normal in appearance. Contrast has not yet reached
the large bowel. The caliber of the abdominal aorta is normal. There is no
periaortic or pericaval lymphadenopathy. There is no evidence of ascites.
The partially distended urinary bladder is normal in appearance. There is no
evidence of an inguinal nor umbilical hernia. Minimal atelectasis
posteriorly at the lung bases is seen. The lumbar vertebral bodies are
preserved in height. There are degenerative changes at multiple lower
thoracic and lumbar disc levels.
IMPRESSION: 1. There is very minimally increased density in the region of the pancreatic
head which may reflect early pancreatic inflammation. I do not see evidence
of gallstones nor intrahepatic ductal dilation or common bile ductal
dilation.
2. There is no evidence of acute urinary tract abnormality nor acute bowel
abnormality.
3. There is fatty infiltration of the liver. There is no evidence of ascites.

## 2009-07-24 ENCOUNTER — Inpatient Hospital Stay: Payer: Self-pay | Admitting: Internal Medicine

## 2010-01-25 IMAGING — CT CT STONE STUDY
1 of 2 series · 15 of 32 positions shown, 19 images · non-contrast
Comparison: none

REASON FOR EXAM: flank pain, hematuria
COMMENTS:

[Series 2: soft tissue · axial · 0.86mm/px · z∈[-158,+334]mm · 15 of 185 slices shown, 19 images]
[im 14/185  soft-tissue]
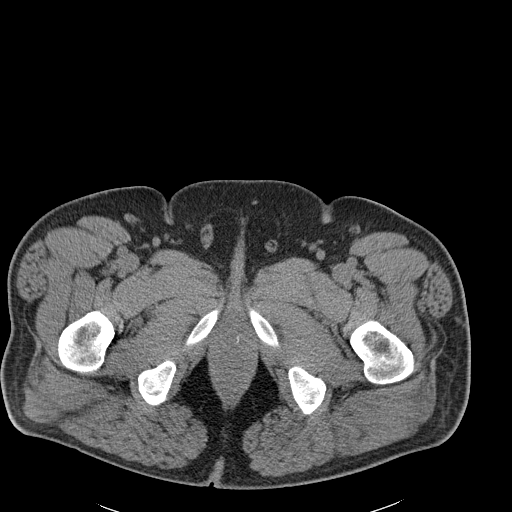
[im 14/185  bone]
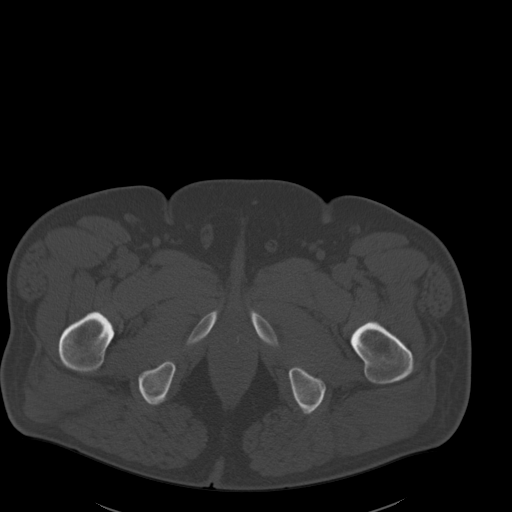
[im 28/185  soft-tissue]
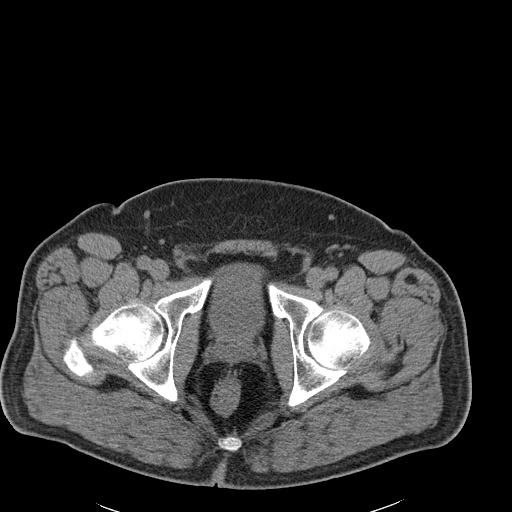
[im 41/185  soft-tissue]
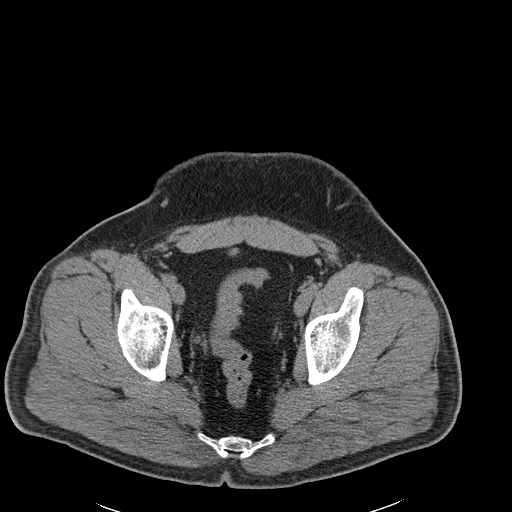
[im 55/185  soft-tissue]
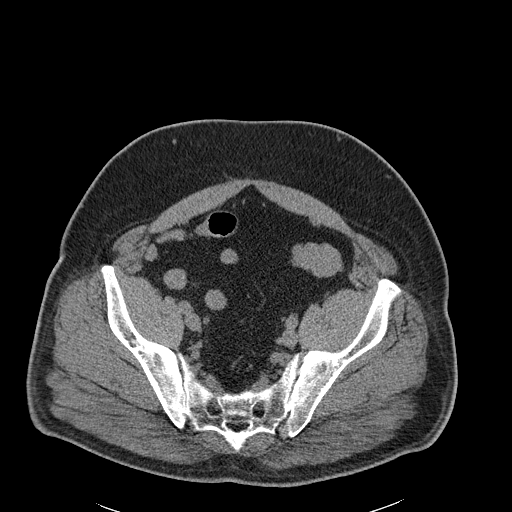
[im 69/185  soft-tissue]
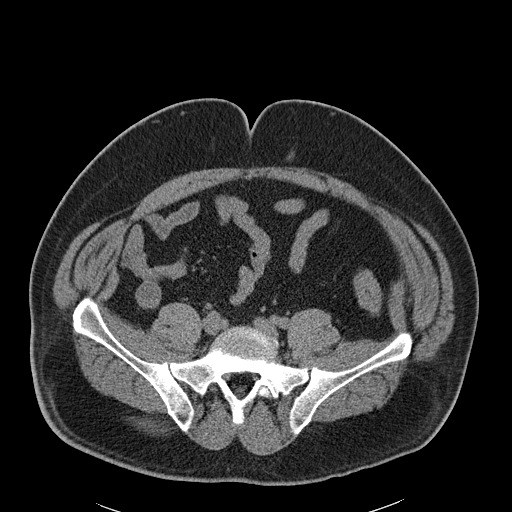
[im 82/185  soft-tissue]
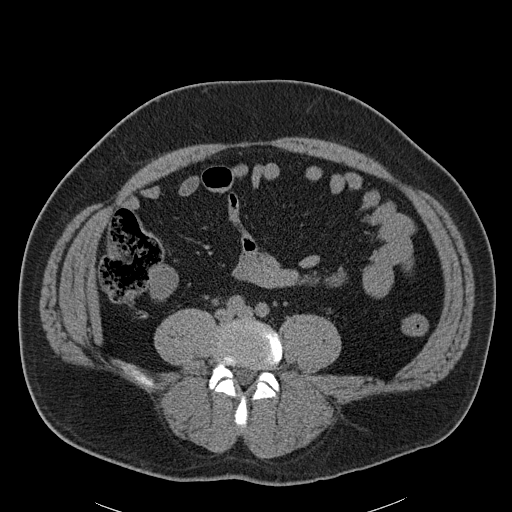
[im 96/185  soft-tissue]
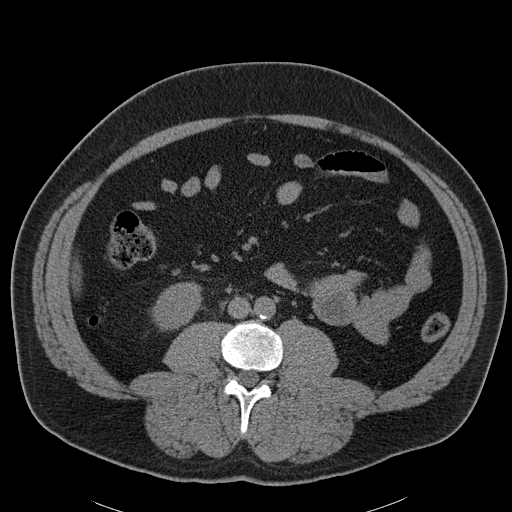
[im 110/185  soft-tissue]
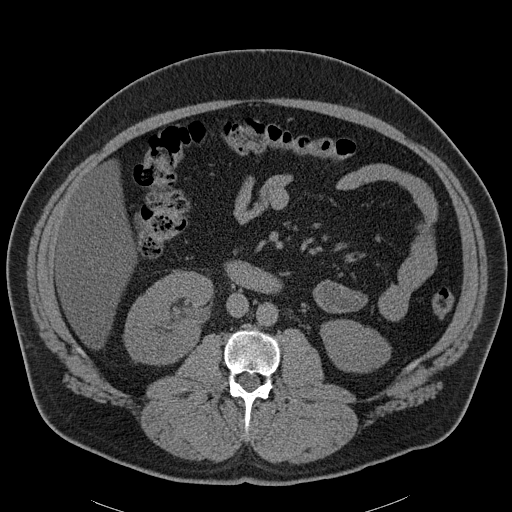
[im 123/185  soft-tissue]
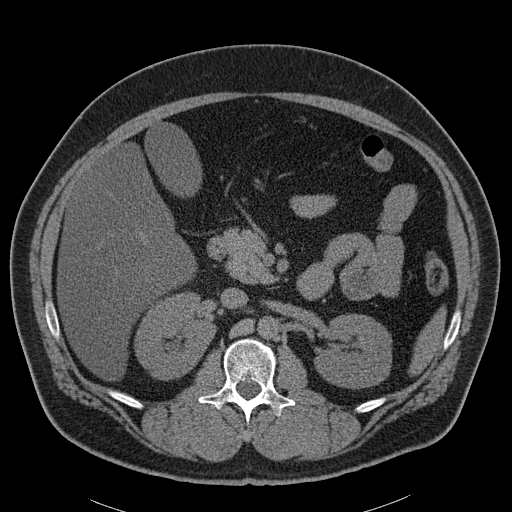
[im 123/185  bone]
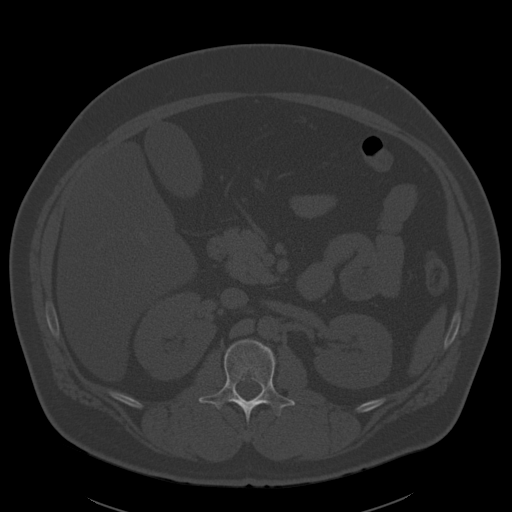
[im 137/185  soft-tissue]
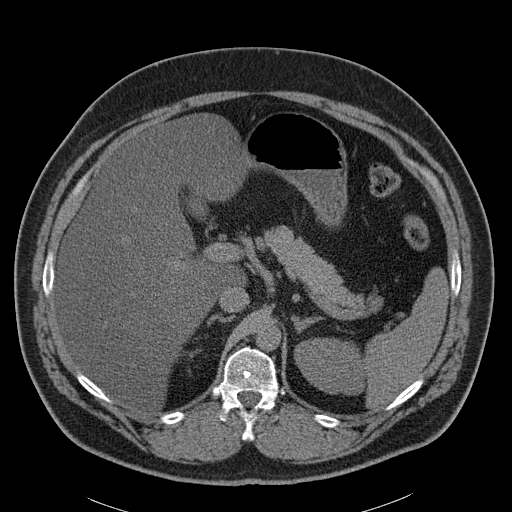
[im 150/185  soft-tissue]
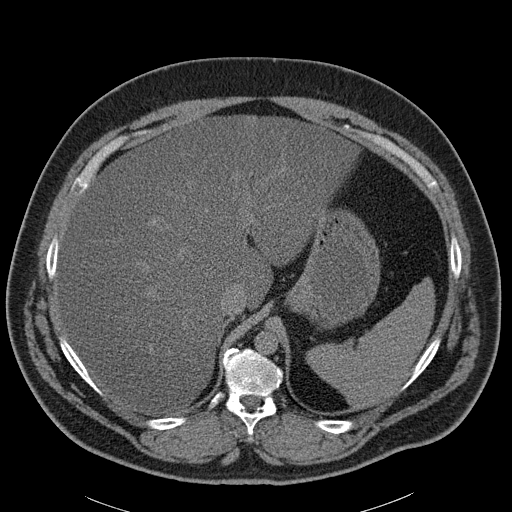
[im 157/185  lung]
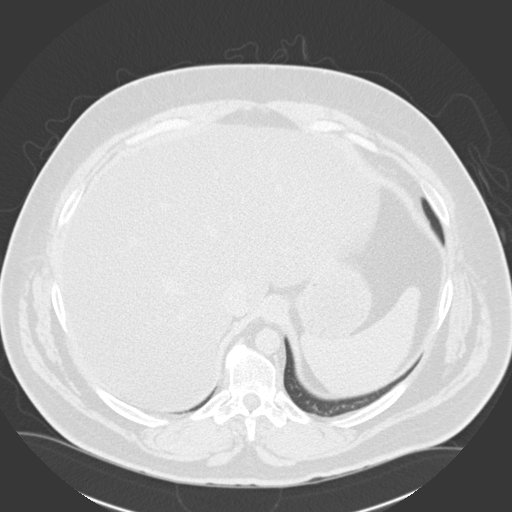
[im 164/185  soft-tissue]
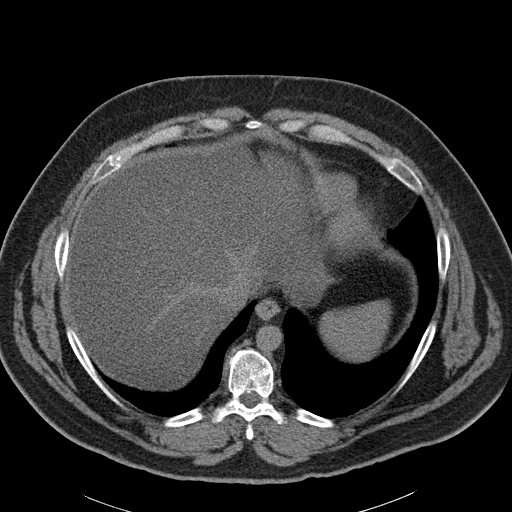
[im 164/185  lung]
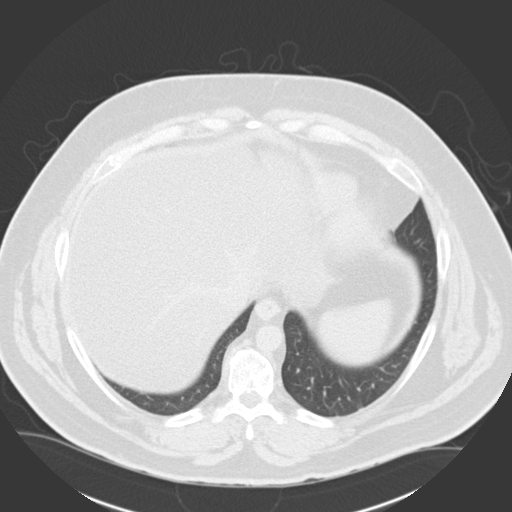
[im 171/185  lung]
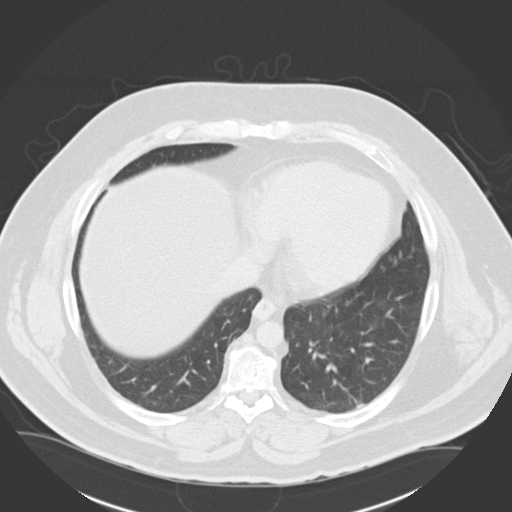
[im 178/185  soft-tissue]
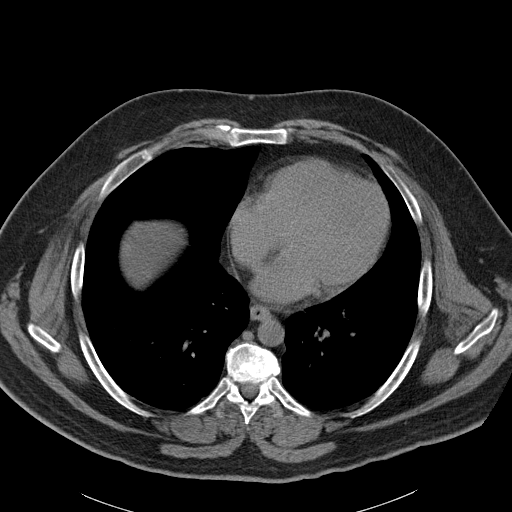
[im 178/185  lung]
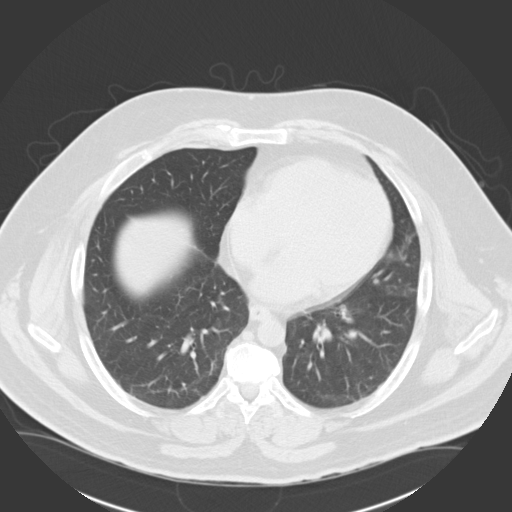

[15 of 32 positions shown; findings below may reference images not displayed]

PROCEDURE:     CT  - CT ABDOMEN /PELVIS WO (STONE)  - August 03, 2008  [DATE]

RESULT:     Comparison is made to prior study dated 04-09-08.

Helical non-contrast 3 mm sections were obtained from the lung bases through
the pubic symphysis.

Evaluation of the lung bases demonstrates no gross abnormalities.

The liver demonstrates a diffuse low attenuating architecture. The spleen,
adrenals and pancreas are unremarkable within the limitations of noncontrast
CT. Note; the liver is diffusely enlarged. Evaluation of the LEFT kidney
demonstrates no evidence of hydronephrosis, hydroureter, nephrolithiasis or
ureterolithiasis. The RIGHT kidney demonstrates mild pelvocaliectasis and
minimal hydroureter. Just distal to the ureterovesical junction on the RIGHT
a 2.5 mm calculus is identified. There is no CT of abdominal or pelvic free
fluid, drainable loculated fluid collections, masses or adenopathy nor
evidence of an abdominal aortic aneurysm. There is no CT evidence of bowel
obstruction, enteritis, colitis, diverticulitis nor appendicitis within the
limitations of a noncontrasted CT.
IMPRESSION: 1. Small distal RIGHT ureteral calculus which appears to be just distal to
the ureterovesical junction.
2. Hepatomegaly as well as findings consistent with hepatic steatosis.

Dr. Rodriges of the Emergency Department was informed of these findings at
the time of the initial interpretation.

## 2010-05-16 IMAGING — US US ABDOMEN COMPLETE
1 series · 7 of 7 positions shown · non-contrast
Comparison: None

CLINICAL DATA: Chest pain.  Elevated LFTs.  Evaluate gallbladder.

COMPLETE ABDOMINAL ULTRASOUND

[Series 1: us abdomen complete · 0.30mm/px · 7 of 7 slices shown]
[im 1/7]
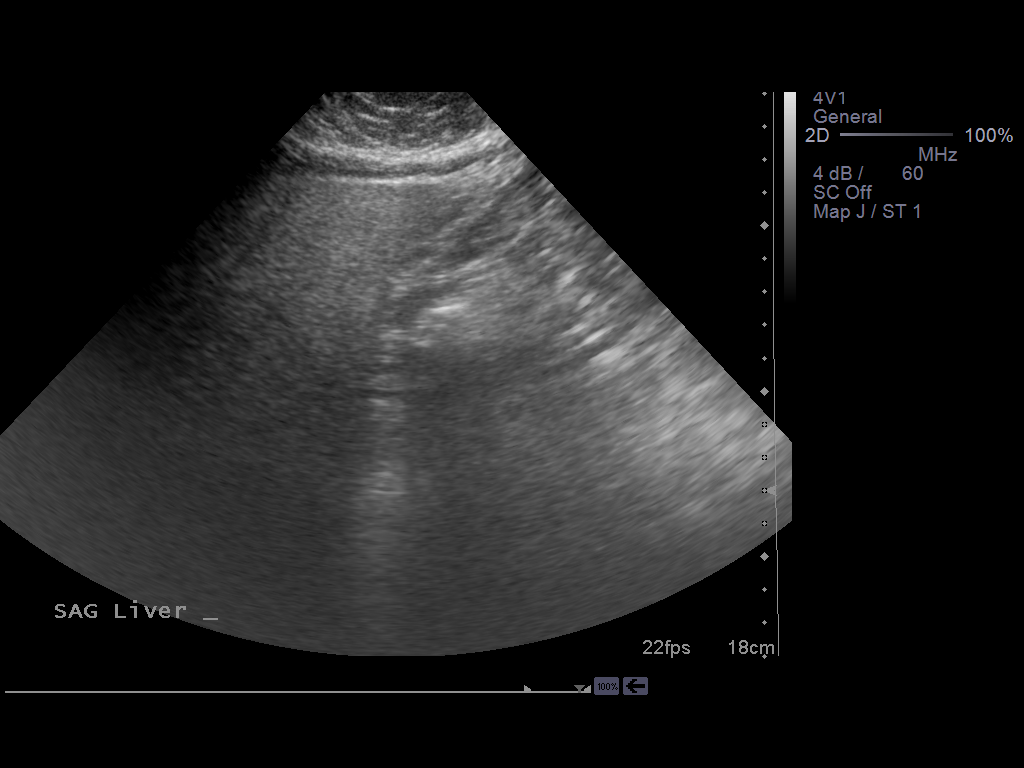
[im 2/7]
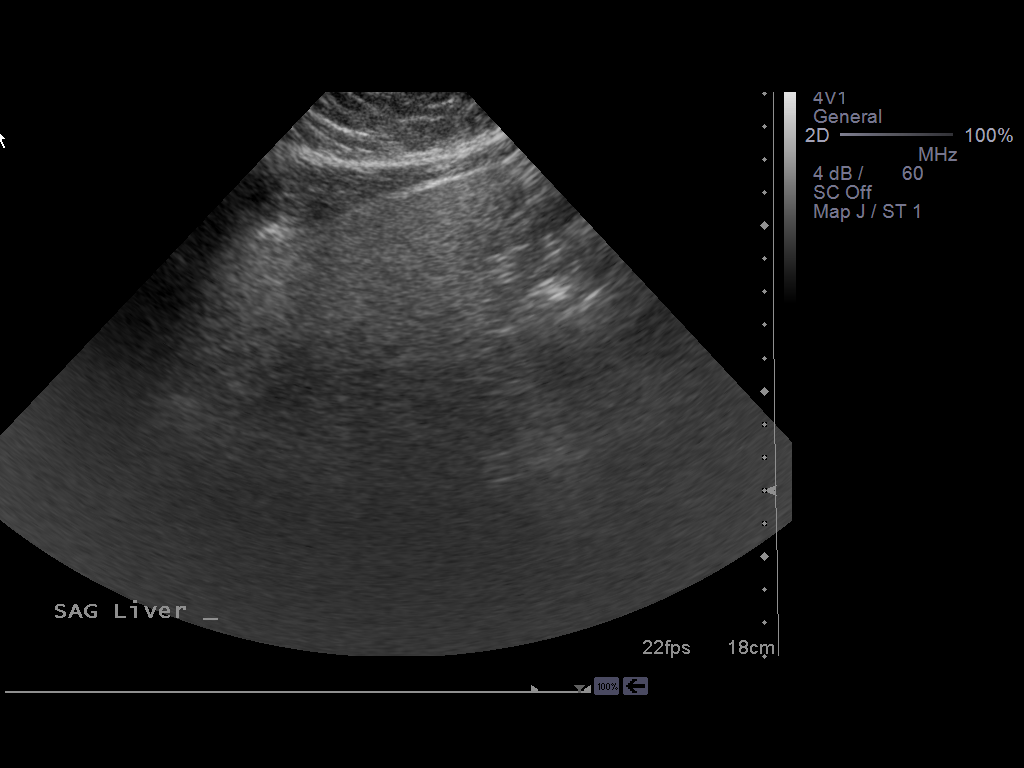
[im 3/7]
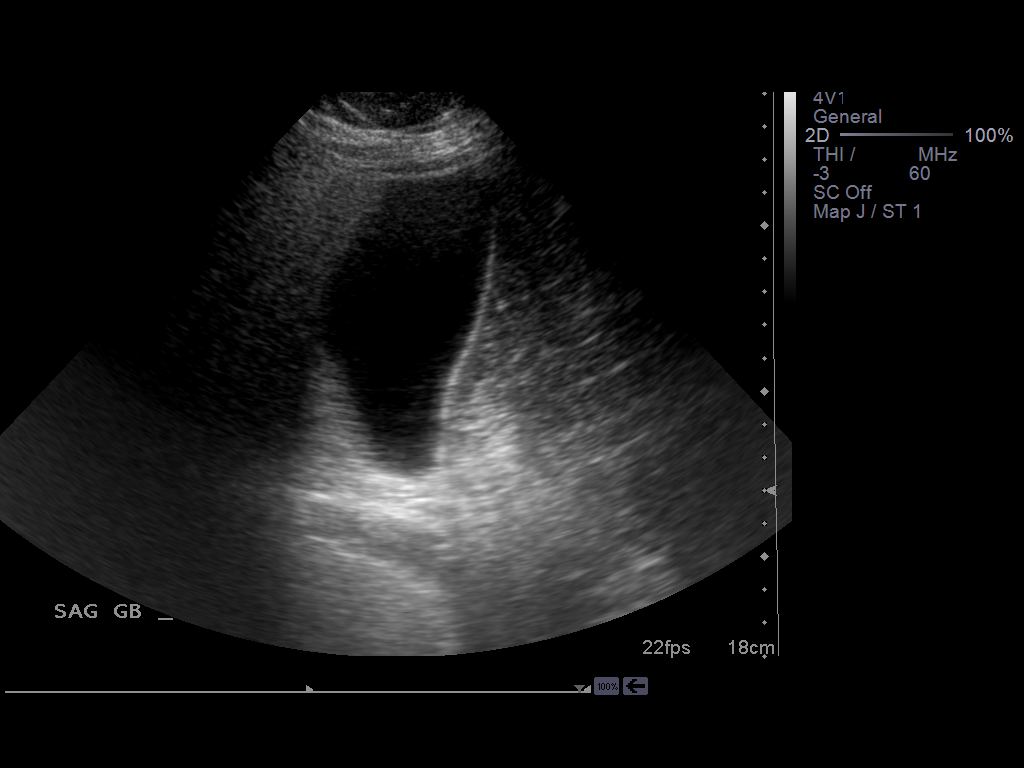
[im 4/7]
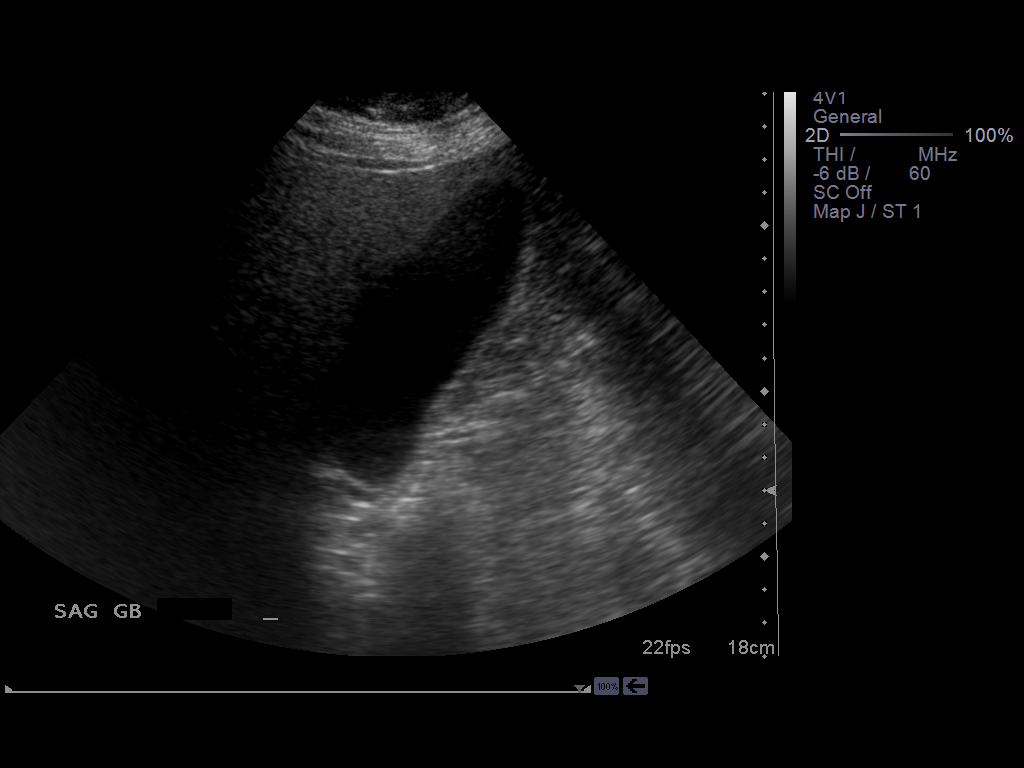
[im 5/7]
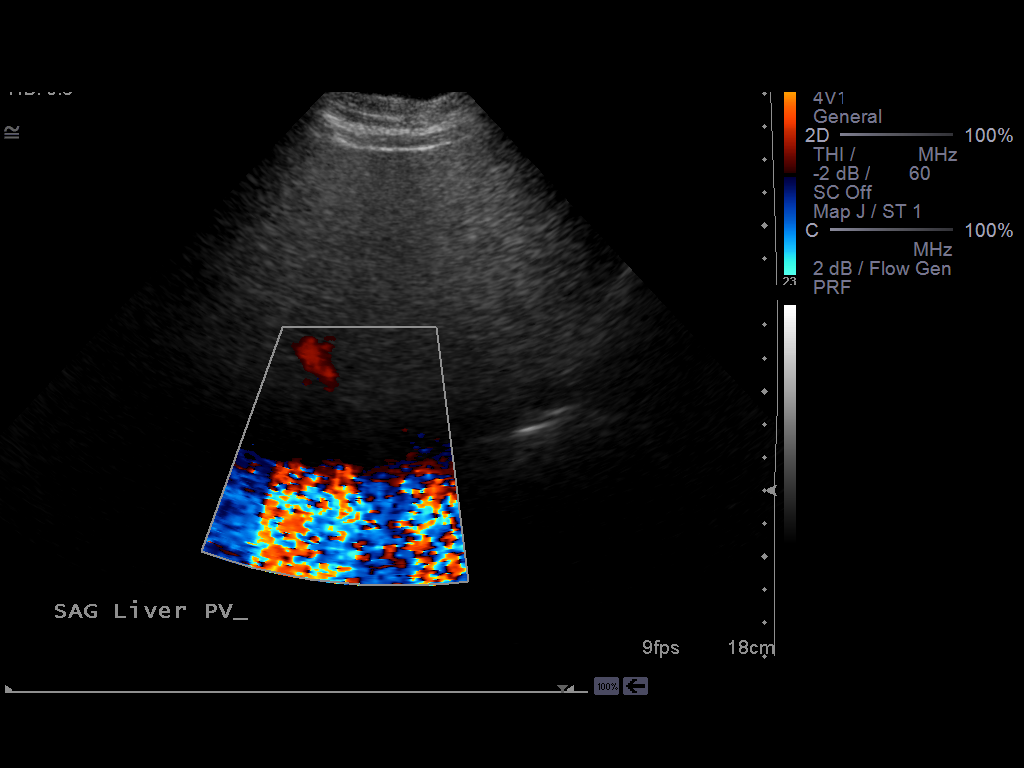
[im 6/7]
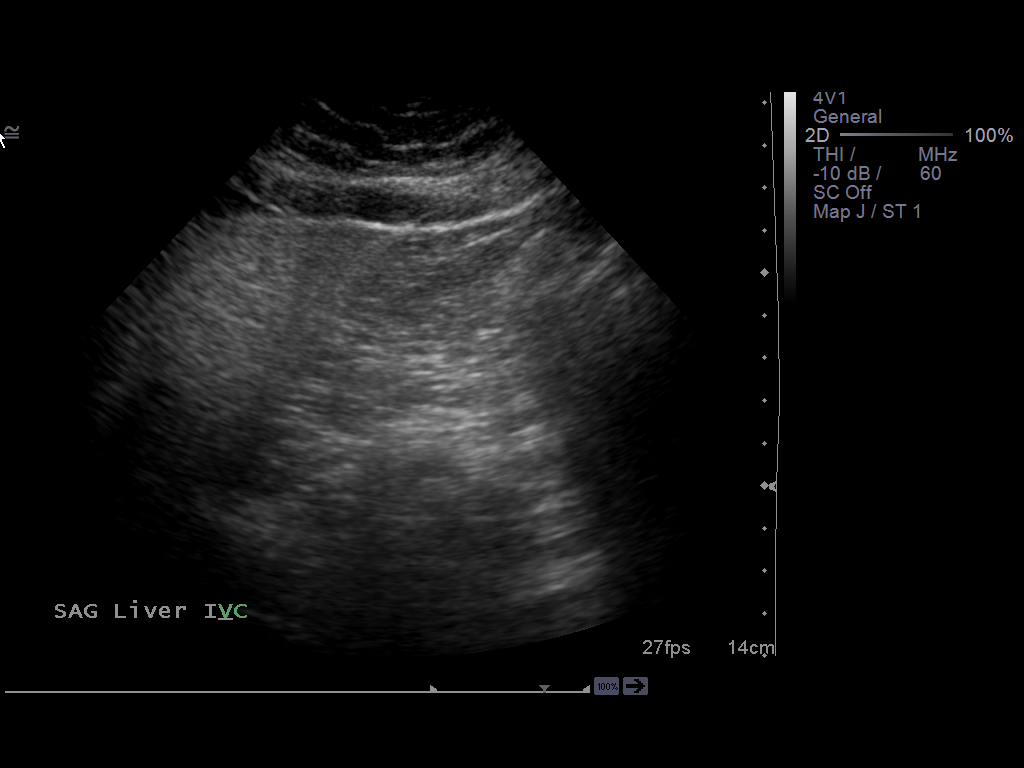
[im 7/7]
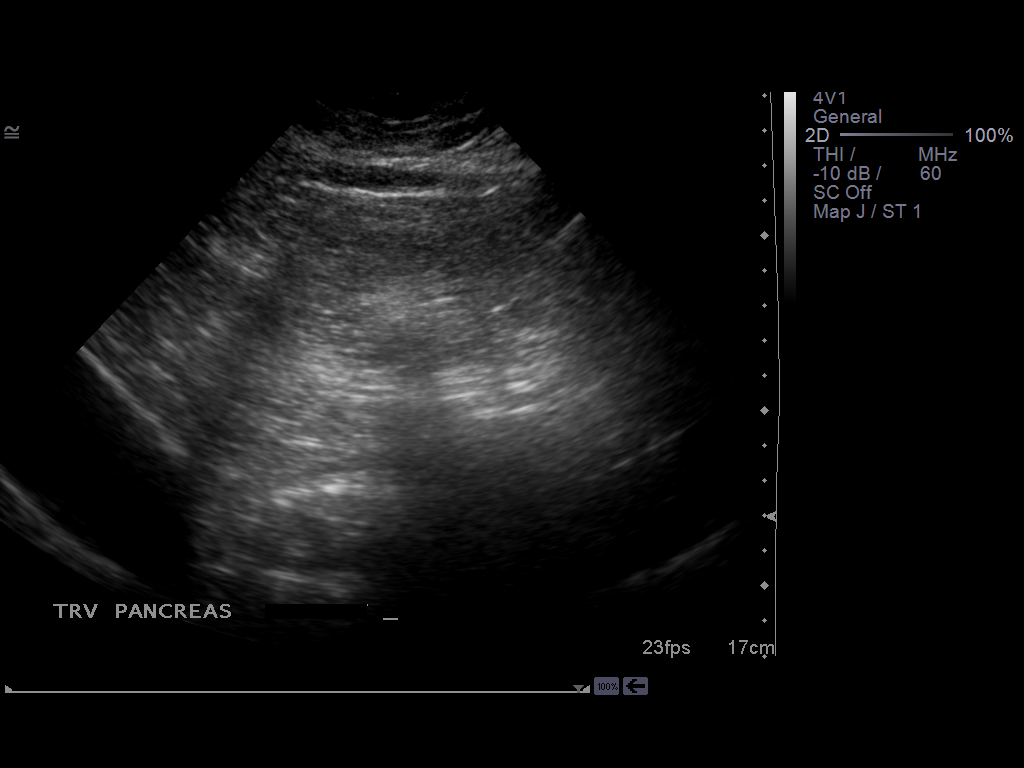

[7 of 7 positions shown; findings below may reference images not displayed]

FINDINGS: Gallbladder:  Normal, without wall thickening, stone, or
pericholecystic fluid.  Sonographic Murphy's sign was not elicited.

Common bile duct:  Normal, 4 mm.

Liver:  Markedly increased in echogenicity consistent with severe
fatty infiltration.

IVC:  Poorly visualized due to overlying bowel gas.

Pancreas:  Poorly visualized due to overlying bowel gas.

Spleen:  Normal in size and echogenicity.

Right Kidney:  13.8 cm.

Left Kidney:  13.4 cm.  Focus of calcification in the upper pole
left kidney suspicious for a nonobstructive stone.

Abdominal aorta:  Poorly visualized due to overlying bowel gas.  No
ascites.
IMPRESSION: 1.  Severe fatty infiltration of the liver.
2.  Otherwise no acute process.
3.  Possible punctate nonobstructive left renal calculus.

## 2010-05-16 IMAGING — CR DG CHEST 1V PORT
1 series · 1 of 1 positions shown · non-contrast
Comparison: None

CLINICAL DATA: History given of chest pain.

PORTABLE CHEST - 1 VIEW

[AP]
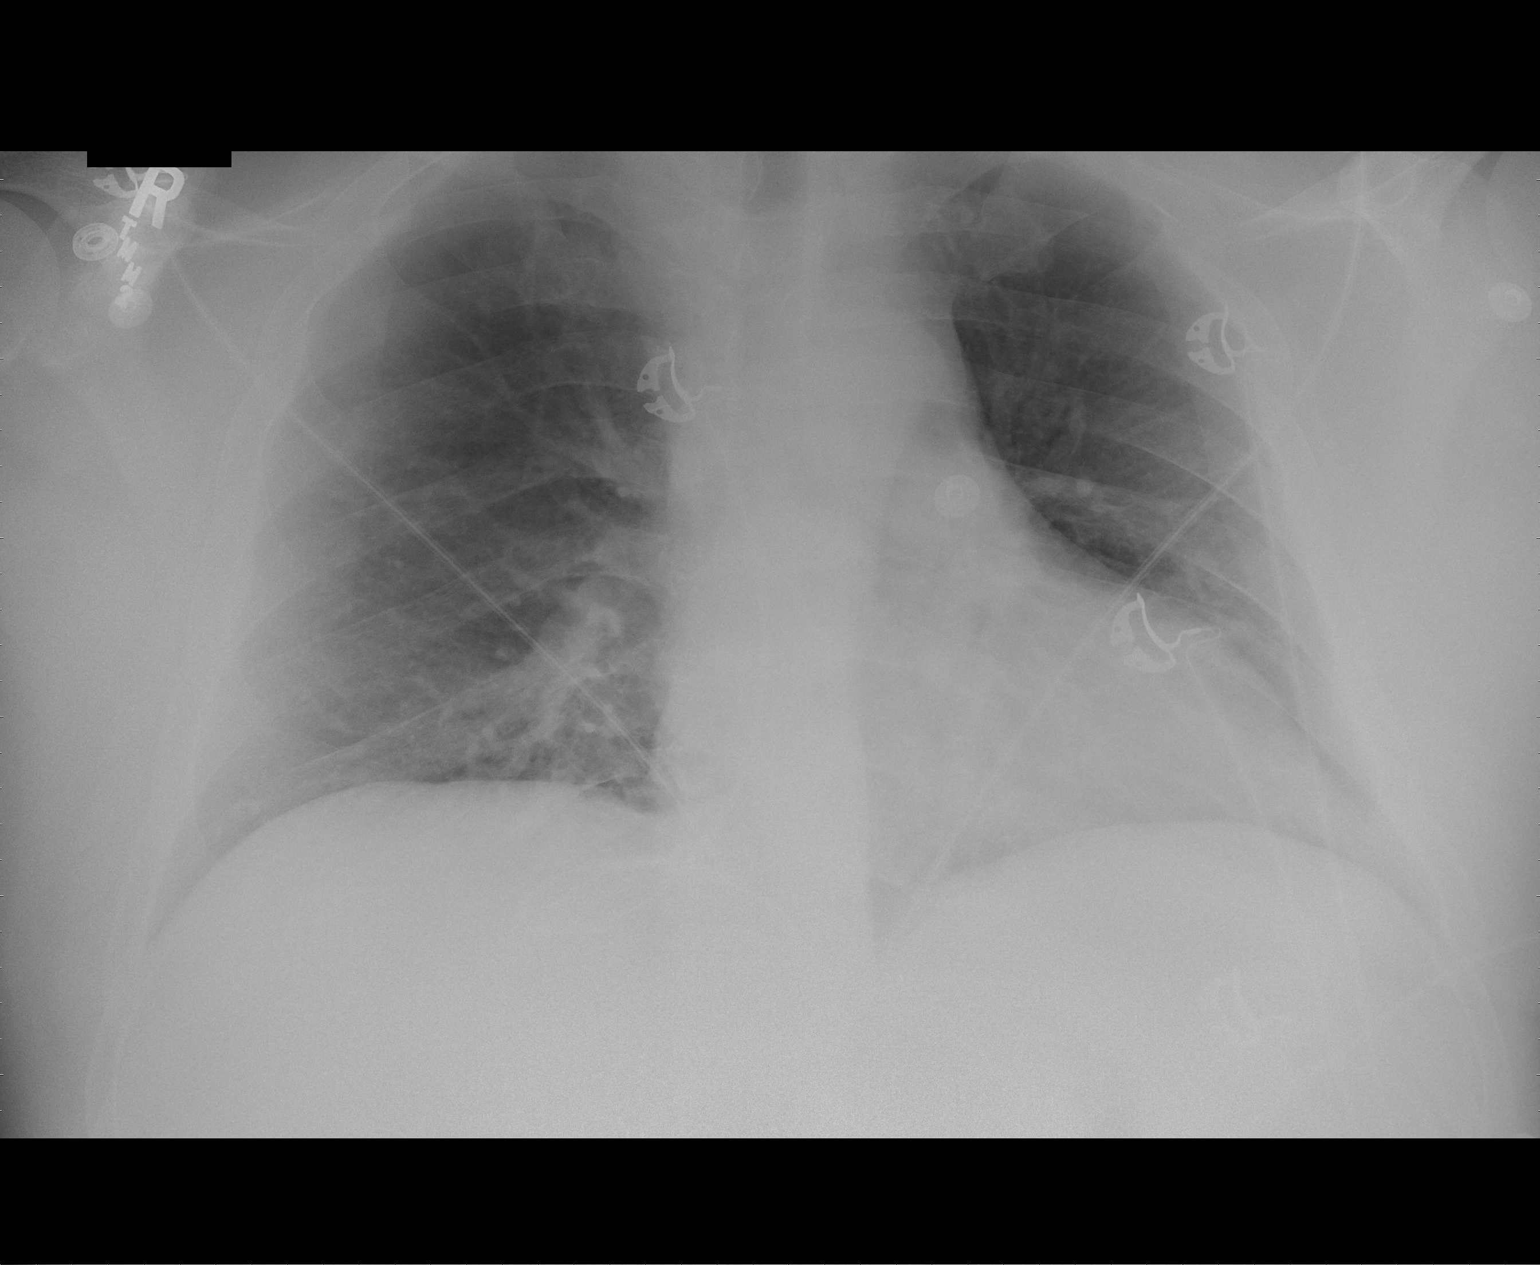

[1 of 1 positions shown; findings below may reference images not displayed]

FINDINGS: There is slight enlargement of the cardiac silhouette.
Lungs free of infiltrates.  No pulmonary edema, pneumonia, or
pleural effusion is seen.  Bones appear average for age.
IMPRESSION: There is slight enlargement of the cardiac silhouette with no
evidence of pulmonary edema or pleural effusion.

## 2010-05-16 IMAGING — US US ABDOMEN COMPLETE
1 series · 8 of 8 positions shown · non-contrast
Comparison: None

CLINICAL DATA: Chest pain.  Elevated LFTs.  Evaluate gallbladder.

COMPLETE ABDOMINAL ULTRASOUND

[Series 1: us abdomen complete · 0.45mm/px · 8 of 8 slices shown]
[im 1/8]
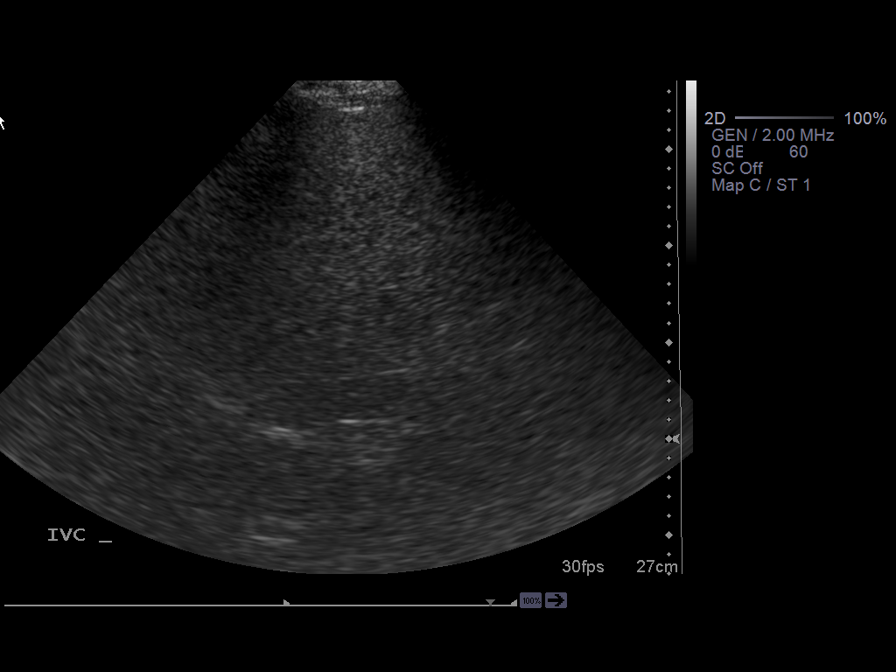
[im 2/8]
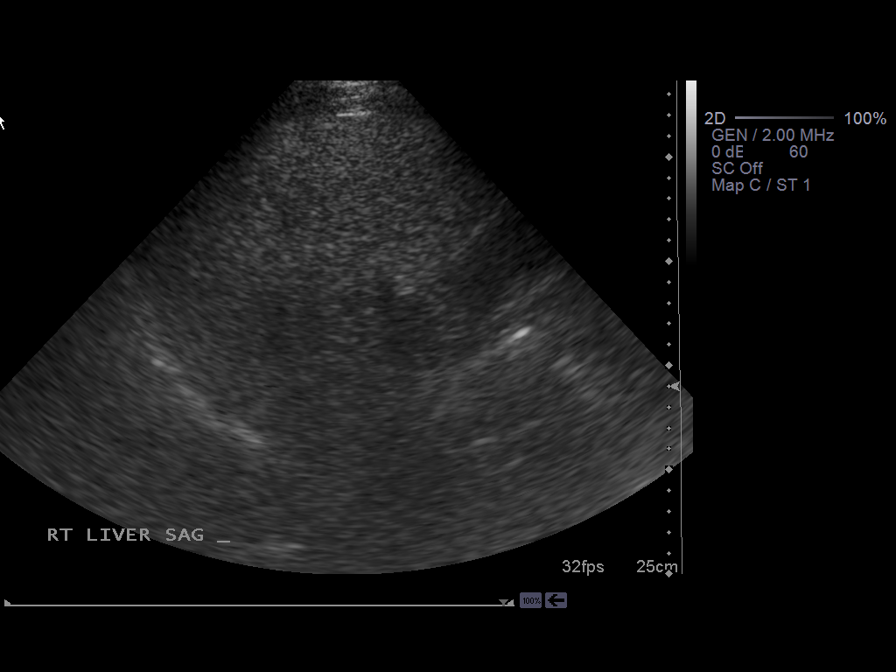
[im 3/8]
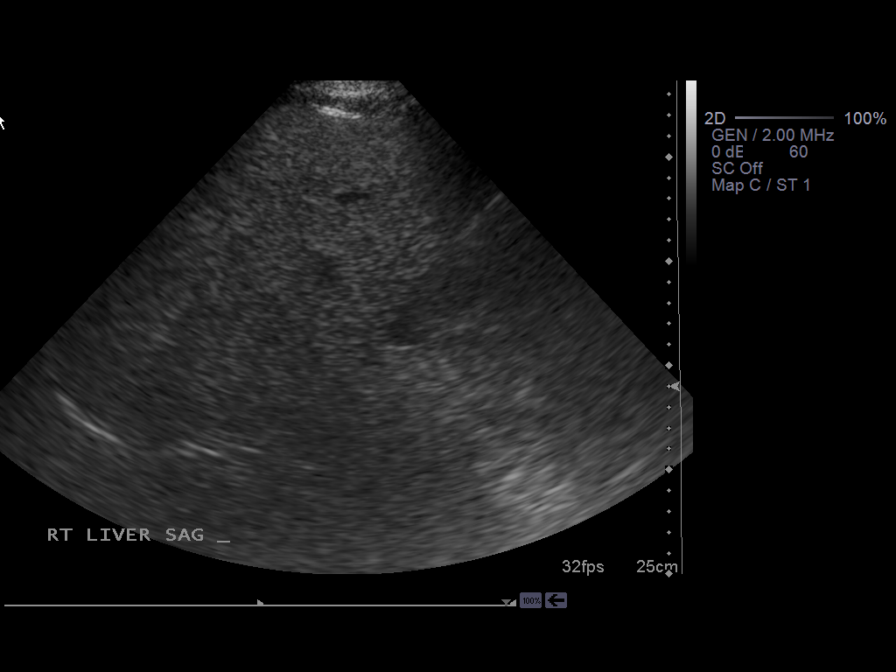
[im 4/8]
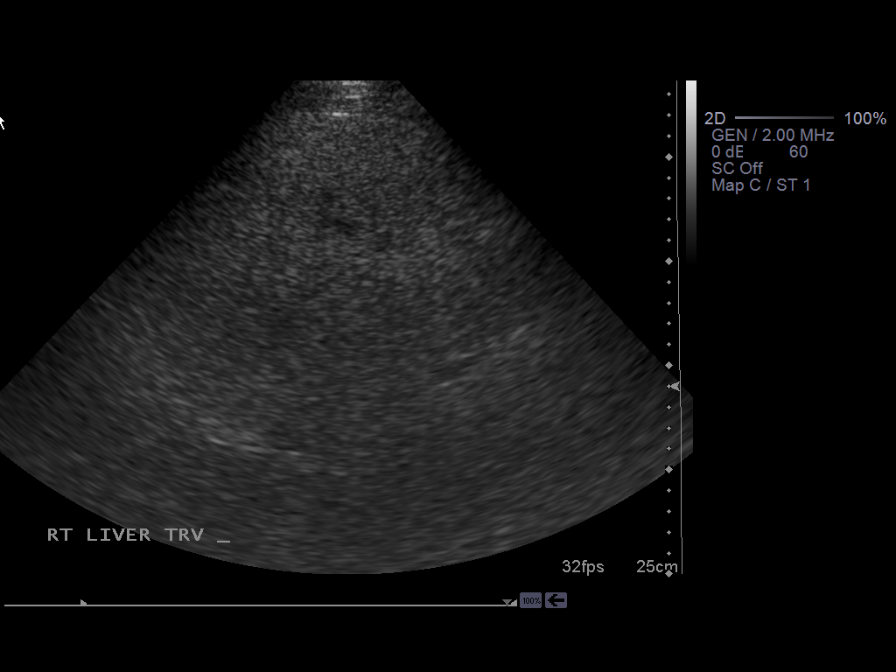
[im 5/8]
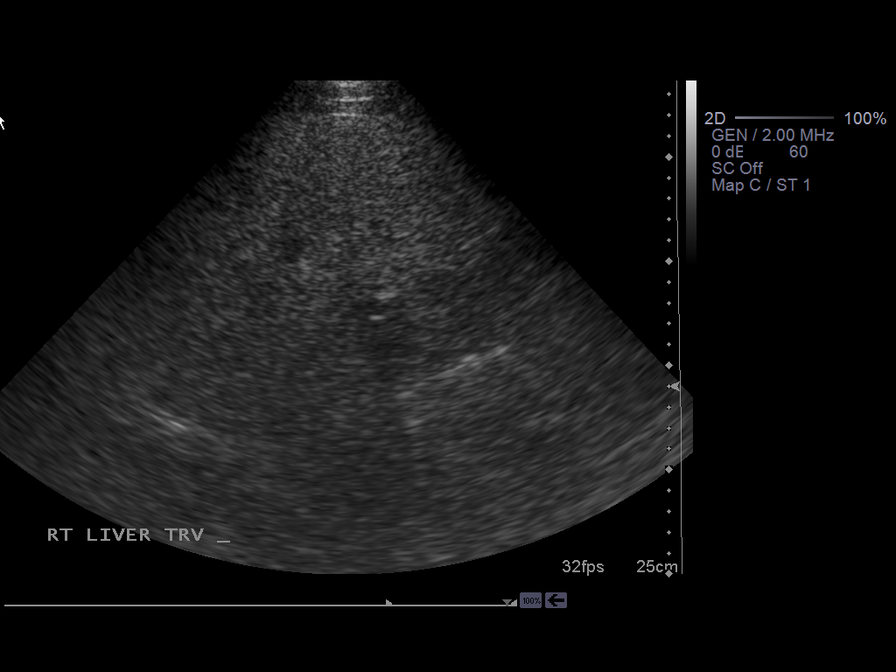
[im 6/8]
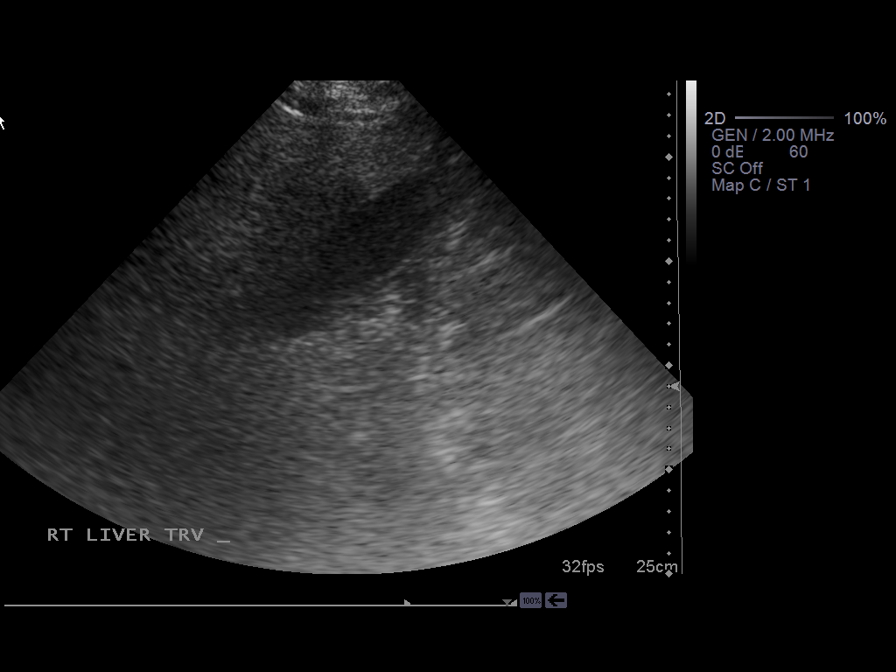
[im 7/8]
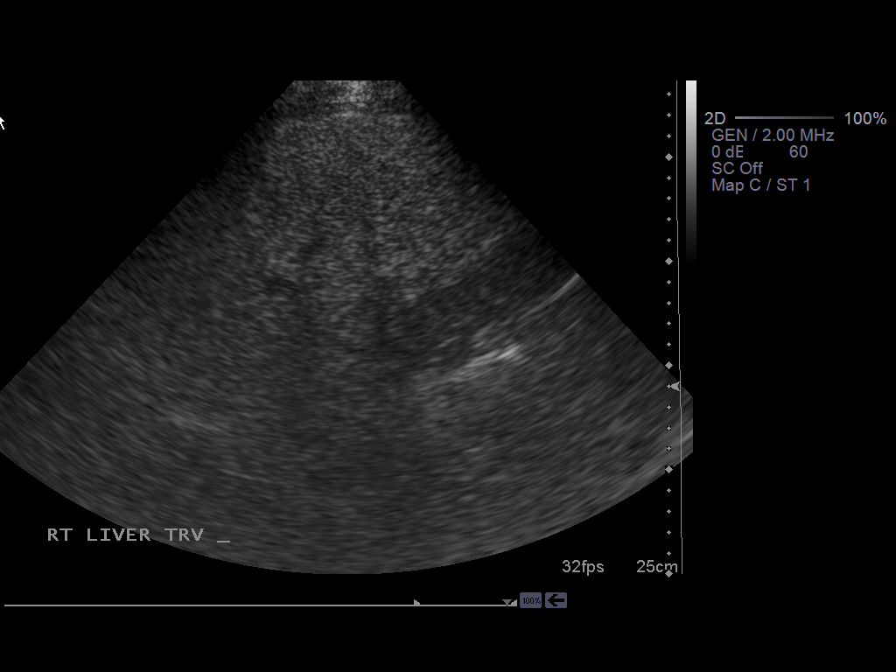
[im 8/8]
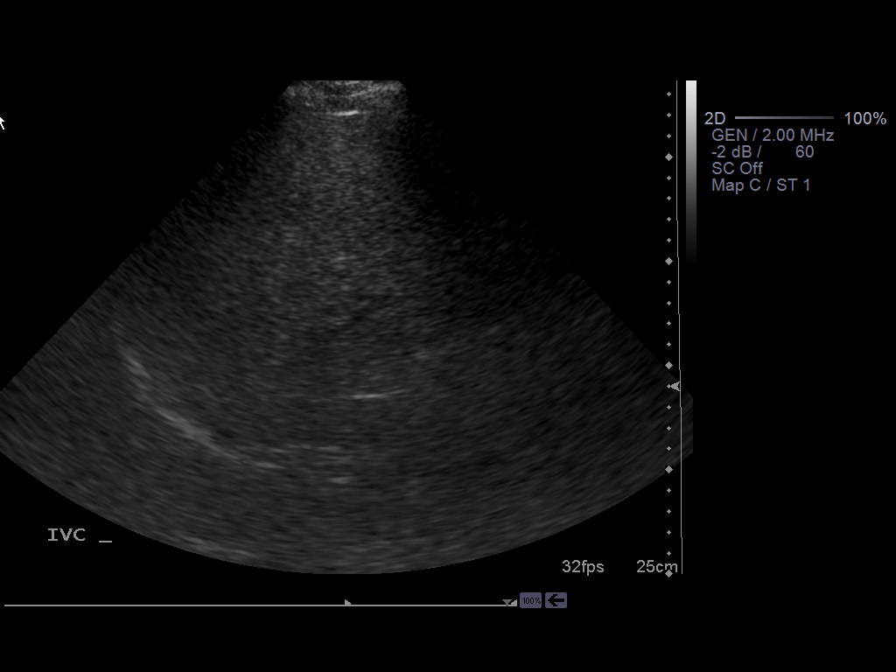

[8 of 8 positions shown; findings below may reference images not displayed]

FINDINGS: Gallbladder:  Normal, without wall thickening, stone, or
pericholecystic fluid.  Sonographic Murphy's sign was not elicited.

Common bile duct:  Normal, 4 mm.

Liver:  Markedly increased in echogenicity consistent with severe
fatty infiltration.

IVC:  Poorly visualized due to overlying bowel gas.

Pancreas:  Poorly visualized due to overlying bowel gas.

Spleen:  Normal in size and echogenicity.

Right Kidney:  13.8 cm.

Left Kidney:  13.4 cm.  Focus of calcification in the upper pole
left kidney suspicious for a nonobstructive stone.

Abdominal aorta:  Poorly visualized due to overlying bowel gas.  No
ascites.
IMPRESSION: 1.  Severe fatty infiltration of the liver.
2.  Otherwise no acute process.
3.  Possible punctate nonobstructive left renal calculus.

## 2010-10-30 ENCOUNTER — Inpatient Hospital Stay: Payer: Self-pay | Admitting: Internal Medicine

## 2010-11-28 LAB — CBC
HCT: 38.5 % — ABNORMAL LOW (ref 39.0–52.0)
Hemoglobin: 13.5 g/dL (ref 13.0–17.0)
MCHC: 35.2 g/dL (ref 30.0–36.0)
MCV: 84 fL (ref 78.0–100.0)
Platelets: 211 10*3/uL (ref 150–400)
RBC: 4.58 MIL/uL (ref 4.22–5.81)
RDW: 13.6 % (ref 11.5–15.5)
WBC: 6.6 10*3/uL (ref 4.0–10.5)

## 2010-11-28 LAB — POCT I-STAT, CHEM 8
BUN: 14 mg/dL (ref 6–23)
Calcium, Ion: 1.14 mmol/L (ref 1.12–1.32)
Chloride: 101 mEq/L (ref 96–112)
Creatinine, Ser: 0.8 mg/dL (ref 0.4–1.5)
Glucose, Bld: 124 mg/dL — ABNORMAL HIGH (ref 70–99)
HCT: 40 % (ref 39.0–52.0)
Hemoglobin: 13.6 g/dL (ref 13.0–17.0)
Potassium: 3.3 mEq/L — ABNORMAL LOW (ref 3.5–5.1)
Sodium: 140 mEq/L (ref 135–145)
TCO2: 27 mmol/L (ref 0–100)

## 2010-11-28 LAB — COMPREHENSIVE METABOLIC PANEL
ALT: 76 U/L — ABNORMAL HIGH (ref 0–53)
AST: 62 U/L — ABNORMAL HIGH (ref 0–37)
Albumin: 3.9 g/dL (ref 3.5–5.2)
Alkaline Phosphatase: 48 U/L (ref 39–117)
BUN: 12 mg/dL (ref 6–23)
CO2: 29 mEq/L (ref 19–32)
Calcium: 9.4 mg/dL (ref 8.4–10.5)
Chloride: 102 mEq/L (ref 96–112)
Creatinine, Ser: 0.69 mg/dL (ref 0.4–1.5)
GFR calc Af Amer: >60 mL/min (ref >60)
GFR calc non Af Amer: >60 mL/min (ref >60)
Glucose, Bld: 128 mg/dL — ABNORMAL HIGH (ref 70–99)
Potassium: 3.2 mEq/L — ABNORMAL LOW (ref 3.5–5.1)
Sodium: 141 mEq/L (ref 135–145)
Total Bilirubin: 1.7 mg/dL — ABNORMAL HIGH (ref 0.3–1.2)
Total Protein: 6.8 g/dL (ref 6.0–8.3)

## 2010-11-28 LAB — POCT CARDIAC MARKERS
CKMB, poc: 1 ng/mL (ref 1.0–8.0)
Myoglobin, poc: 79.9 ng/mL (ref 12–200)
Troponin i, poc: <0.05 ng/mL (ref 0.00–0.09)

## 2010-11-28 LAB — DIFFERENTIAL
Basophils Absolute: 0 10*3/uL (ref 0.0–0.1)
Basophils Relative: 1 % (ref 0–1)
Eosinophils Absolute: 0.1 10*3/uL (ref 0.0–0.7)
Eosinophils Relative: 1 % (ref 0–5)
Lymphocytes Relative: 33 % (ref 12–46)
Lymphs Abs: 2.2 10*3/uL (ref 0.7–4.0)
Monocytes Absolute: 0.4 10*3/uL (ref 0.1–1.0)
Monocytes Relative: 7 % (ref 3–12)
Neutro Abs: 3.8 10*3/uL (ref 1.7–7.7)
Neutrophils Relative %: 58 % (ref 43–77)

## 2010-11-28 LAB — LIPASE, BLOOD: Lipase: 28 U/L (ref 11–59)

## 2011-01-28 ENCOUNTER — Emergency Department: Payer: Self-pay | Admitting: Unknown Physician Specialty

## 2011-02-08 ENCOUNTER — Ambulatory Visit: Payer: Self-pay | Admitting: Rheumatology

## 2011-02-19 ENCOUNTER — Emergency Department: Payer: Self-pay | Admitting: Emergency Medicine

## 2011-05-16 ENCOUNTER — Ambulatory Visit: Payer: Self-pay | Admitting: Internal Medicine

## 2011-08-26 ENCOUNTER — Ambulatory Visit: Payer: Self-pay | Admitting: Internal Medicine

## 2011-08-28 ENCOUNTER — Ambulatory Visit: Payer: Self-pay | Admitting: Internal Medicine

## 2012-03-04 ENCOUNTER — Emergency Department: Payer: Self-pay | Admitting: *Deleted

## 2012-03-06 ENCOUNTER — Ambulatory Visit: Payer: Self-pay | Admitting: Internal Medicine

## 2012-03-11 ENCOUNTER — Ambulatory Visit: Payer: Self-pay

## 2012-04-12 ENCOUNTER — Emergency Department: Payer: Self-pay | Admitting: Emergency Medicine

## 2012-04-13 ENCOUNTER — Emergency Department: Payer: Self-pay | Admitting: Emergency Medicine

## 2012-04-14 LAB — CBC
HCT: 44.1 % (ref 40.0–52.0)
HGB: 14.6 g/dL (ref 13.0–18.0)
MCH: 27.8 pg (ref 26.0–34.0)
MCHC: 33 g/dL (ref 32.0–36.0)
MCV: 84 fL (ref 80–100)
Platelet: 215 10*3/uL (ref 150–440)
RBC: 5.24 10*6/uL (ref 4.40–5.90)
RDW: 13.7 % (ref 11.5–14.5)
WBC: 11.8 10*3/uL — ABNORMAL HIGH (ref 3.8–10.6)

## 2012-04-14 LAB — COMPREHENSIVE METABOLIC PANEL
Albumin: 3.6 g/dL (ref 3.4–5.0)
Alkaline Phosphatase: 48 U/L — ABNORMAL LOW (ref 50–136)
Anion Gap: 6 — ABNORMAL LOW (ref 7–16)
BUN: 17 mg/dL (ref 7–18)
Bilirubin,Total: 1.3 mg/dL — ABNORMAL HIGH (ref 0.2–1.0)
Calcium, Total: 8.2 mg/dL — ABNORMAL LOW (ref 8.5–10.1)
Chloride: 107 mmol/L (ref 98–107)
Co2: 28 mmol/L (ref 21–32)
Creatinine: 0.83 mg/dL (ref 0.60–1.30)
EGFR (African American): >60
EGFR (Non-African Amer.): >60
Glucose: 102 mg/dL — ABNORMAL HIGH (ref 65–99)
Osmolality: 283 (ref 275–301)
Potassium: 4.5 mmol/L (ref 3.5–5.1)
SGOT(AST): 35 U/L (ref 15–37)
SGPT (ALT): 23 U/L (ref 12–78)
Sodium: 141 mmol/L (ref 136–145)
Total Protein: 6.9 g/dL (ref 6.4–8.2)

## 2012-04-14 LAB — CSF CELL COUNT WITH DIFFERENTIAL
CSF Tube #: 1
CSF Tube #: 3
Eosinophil: 0 %
Eosinophil: 0 %
Lymphocytes: 40 %
Lymphocytes: 50 %
Monocytes/Macrophages: 20 %
Monocytes/Macrophages: 25 %
Neutrophils: 25 %
Neutrophils: 40 %
Other Cells: 0 %
Other Cells: 0 %
RBC (CSF): 2 /mm3
RBC (CSF): 2 /mm3
WBC (CSF): 5 /mm3
WBC (CSF): 7 /mm3

## 2012-04-14 LAB — URINALYSIS, COMPLETE
Bacteria: NONE SEEN
Bilirubin,UR: NEGATIVE
Glucose,UR: NEGATIVE mg/dL (ref 0–75)
Hyaline Cast: 3
Ketone: NEGATIVE
Leukocyte Esterase: NEGATIVE
Nitrite: NEGATIVE
Ph: 5 (ref 4.5–8.0)
Protein: 30
RBC,UR: 7 /HPF (ref 0–5)
Specific Gravity: 1.023 (ref 1.003–1.030)
Squamous Epithelial: 1
WBC UR: 2 /HPF (ref 0–5)

## 2012-04-14 LAB — GLUCOSE, CSF: Glucose, CSF: 64 mg/dL (ref 40–75)

## 2012-04-14 LAB — PROTEIN, CSF: Protein, CSF: 65 mg/dL — ABNORMAL HIGH (ref 15–45)

## 2012-04-17 LAB — CSF CULTURE

## 2012-04-22 IMAGING — CR DG CHEST 2V
1 series · 2 of 2 positions shown · non-contrast
Comparison: none

REASON FOR EXAM: pain
COMMENTS:

[Series 1: view not recorded · 0.17mm/px · 2 of 2 slices shown]
[im 1/2]
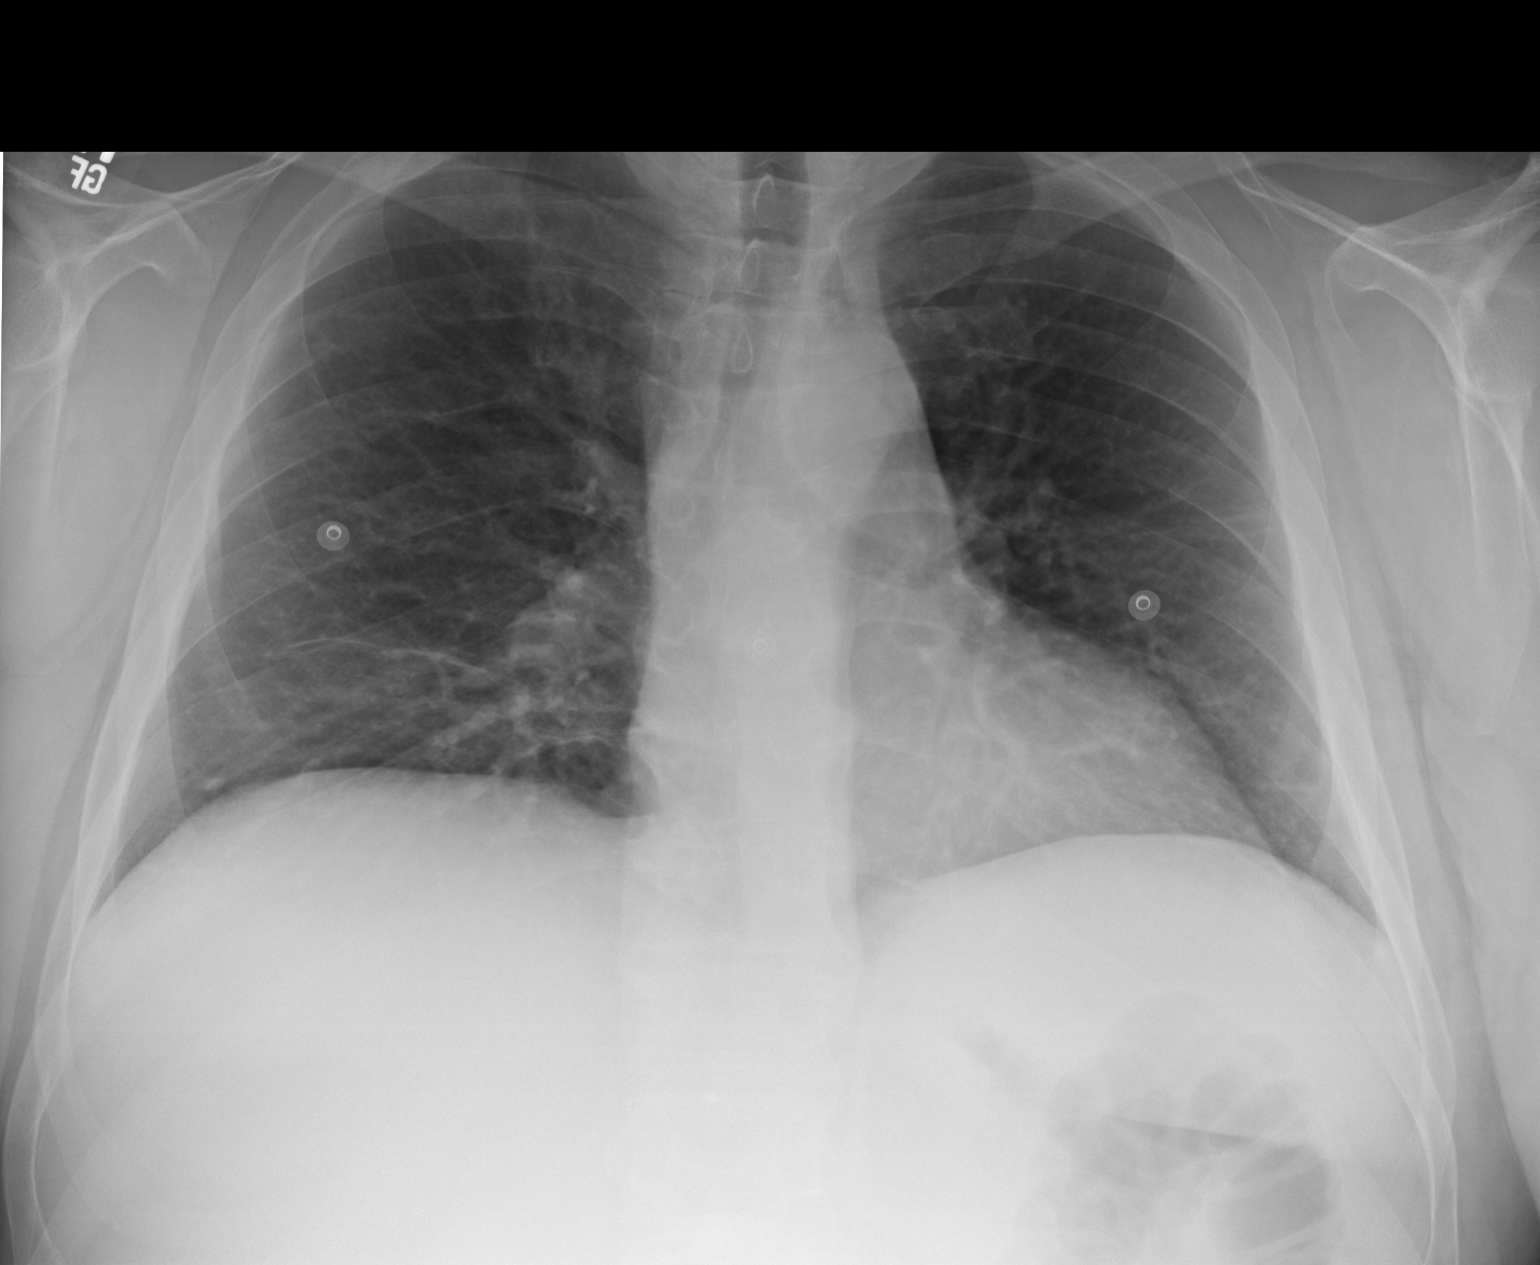
[im 2/2]
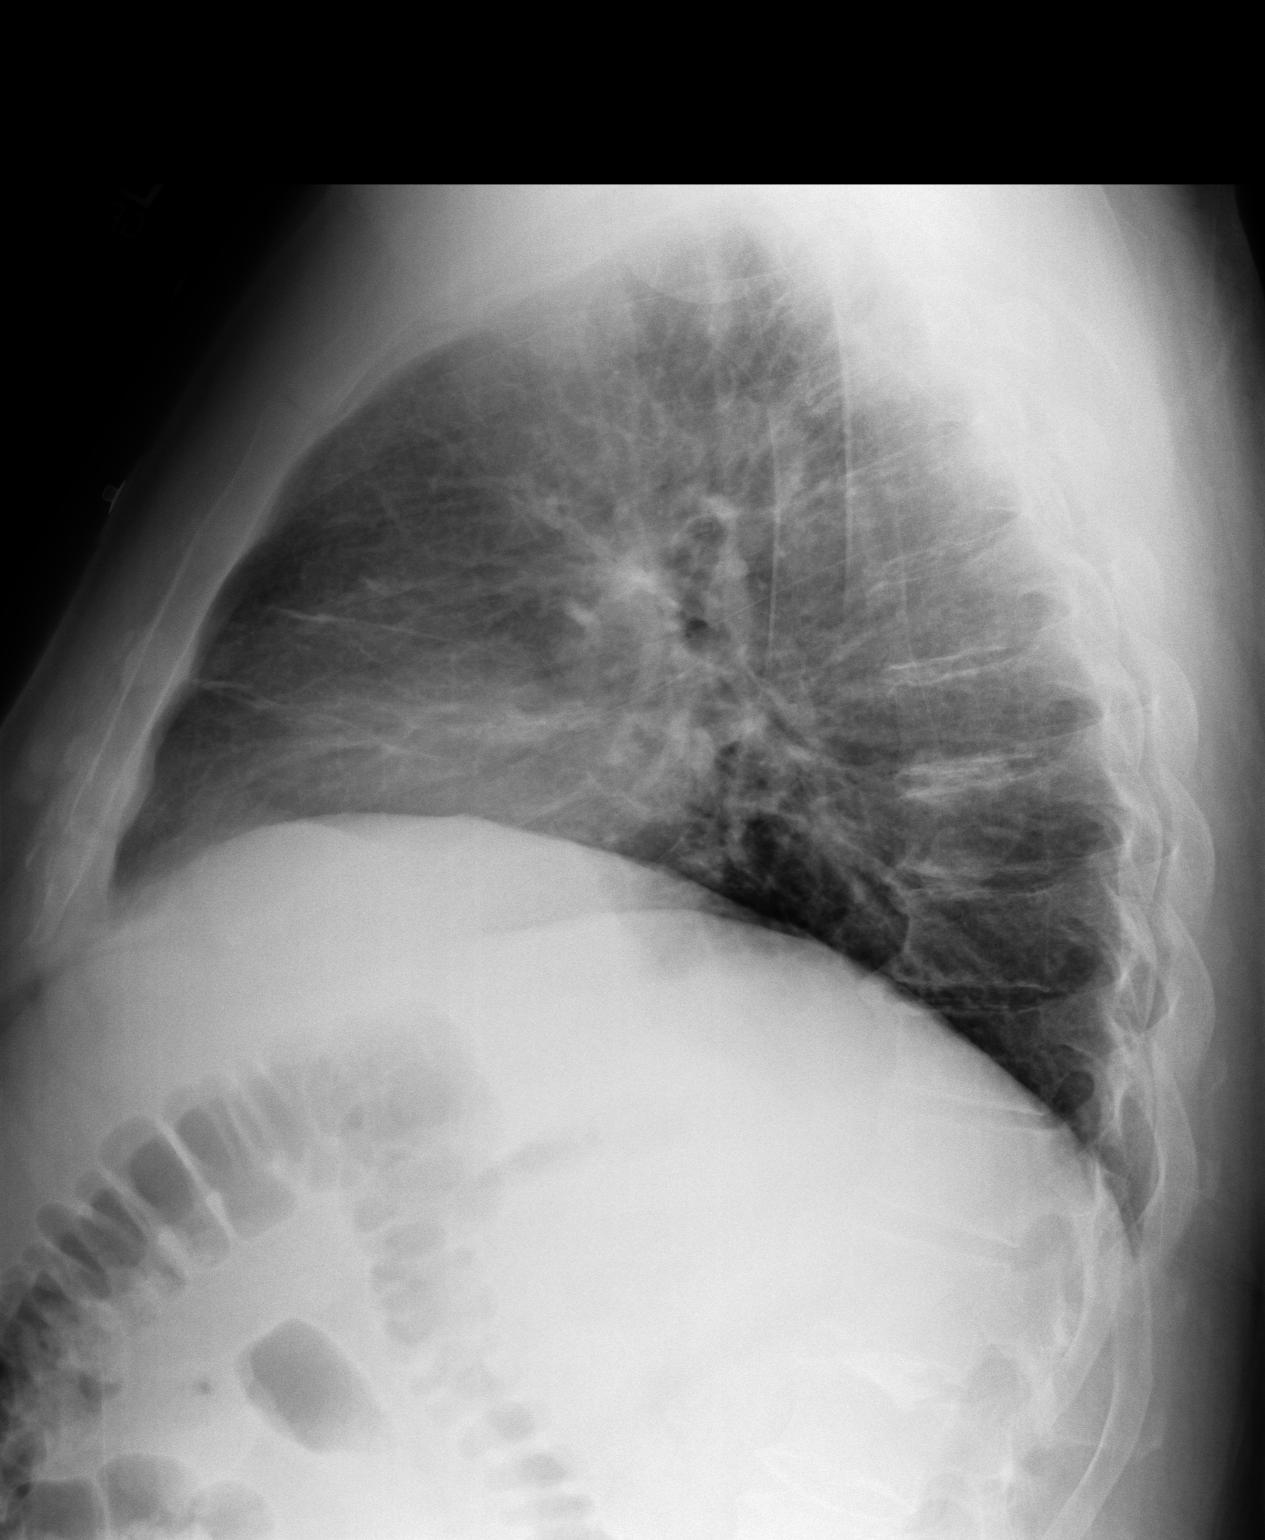

[2 of 2 positions shown; findings below may reference images not displayed]

PROCEDURE:     DXR - DXR CHEST PA (OR AP) AND LATERAL  - October 30, 2010  [DATE]

RESULT:     Comparison is made to the prior exam of 07/24/2009. There are
noted a few fine linear densities in the right middle lobe compatible with
slight discoid atelectasis or fibrotic strands. No pneumonia, pneumothorax
or pleural effusion is seen. No acute changes of the heart or pulmonary
vasculature are identified.
IMPRESSION: 1. There are a few linear densities in the right middle lobe consistent with
discoid atelectasis or fibrotic strands.
2. No pneumonia or other acute change is identified.

## 2012-04-22 IMAGING — CT CT ABD-PELV W/O CM
1 of 2 series · 15 of 32 positions shown, 19 images · non-contrast
Comparison: none

REASON FOR EXAM: (1) pain; (2) pain
COMMENTS:   May transport without cardiac monitor

PROCEDURE:     CT  - CT ABDOMEN AND PELVIS W[DATE]  [DATE]
RESULT:     Comparison: 08/03/2008
TECHNIQUE: Multiple axial images from the lung bases to the symphysis pubis
were obtained without oral and without intravenous contrast.

[Series 2: 3mm soft tissue · axial · 0.88mm/px · z∈[-1092,-606]mm · 15 of 178 slices shown, 19 images]
[im 8/178  soft-tissue]
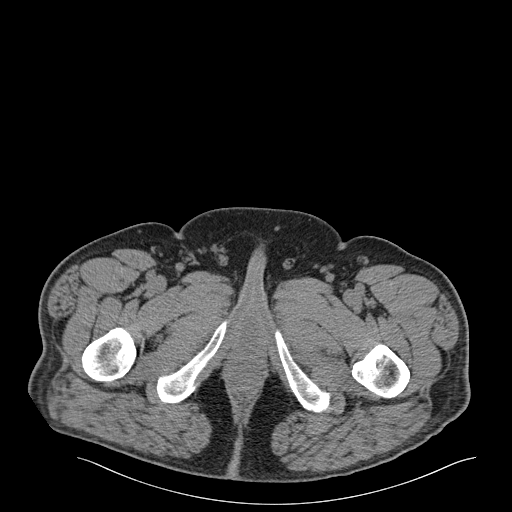
[im 8/178  bone]
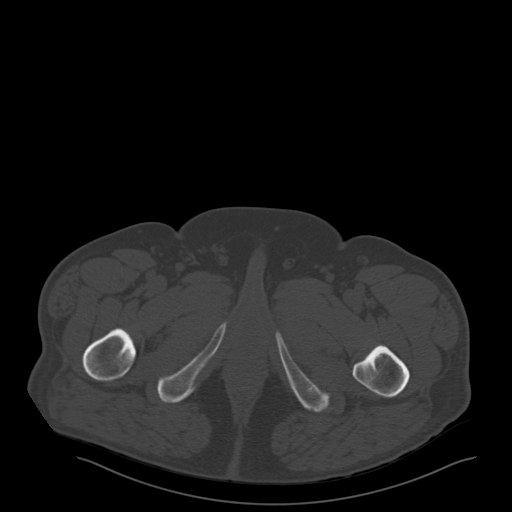
[im 22/178  soft-tissue]
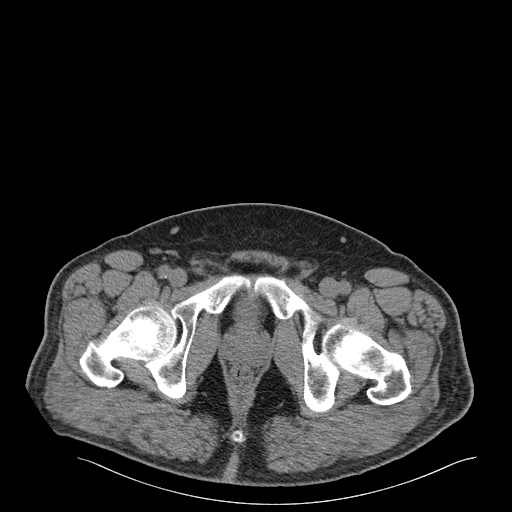
[im 36/178  soft-tissue]
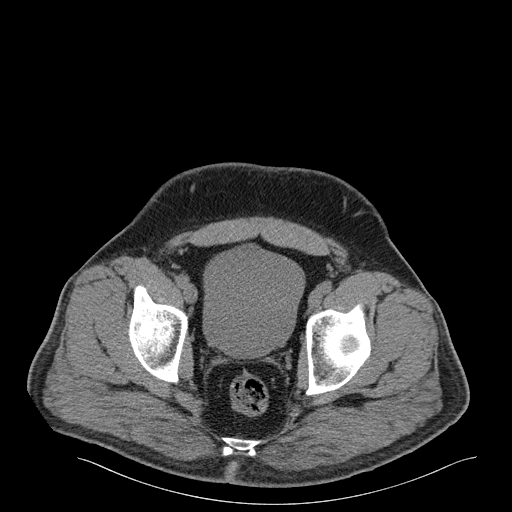
[im 50/178  soft-tissue]
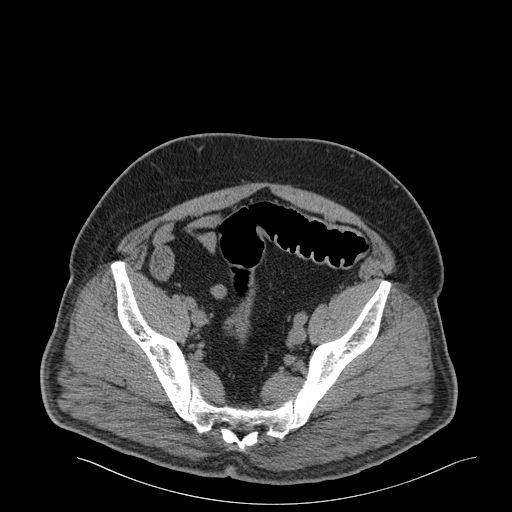
[im 64/178  soft-tissue]
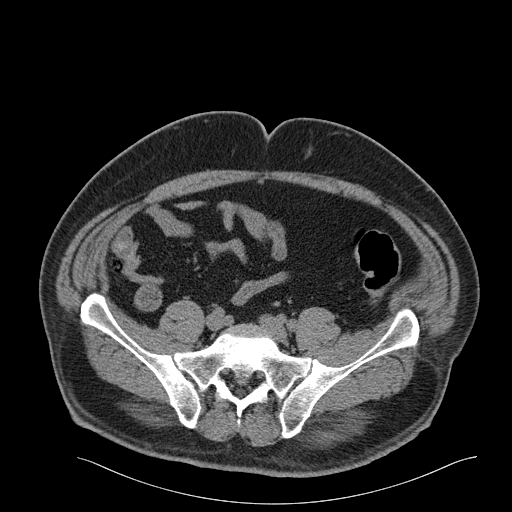
[im 78/178  soft-tissue]
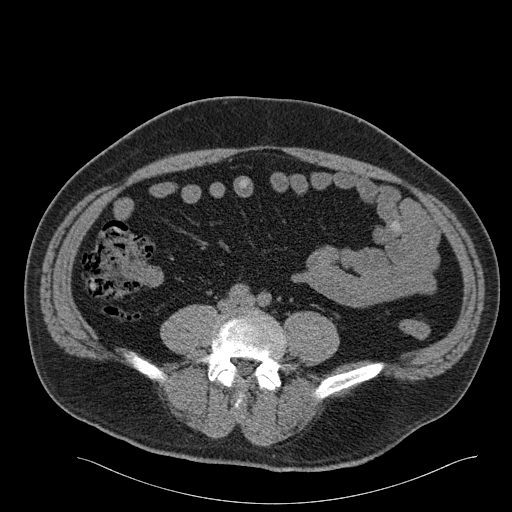
[im 93/178  soft-tissue]
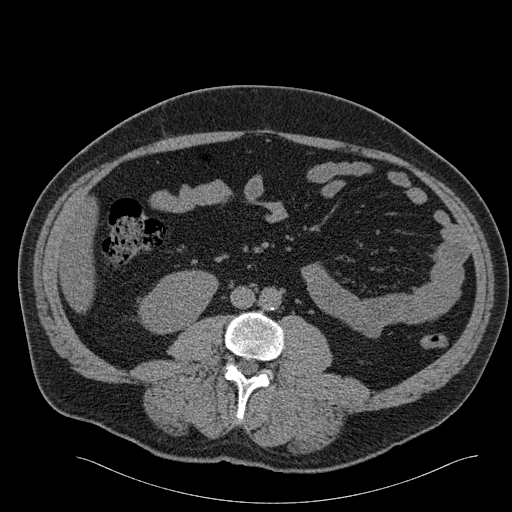
[im 100/178  soft-tissue]
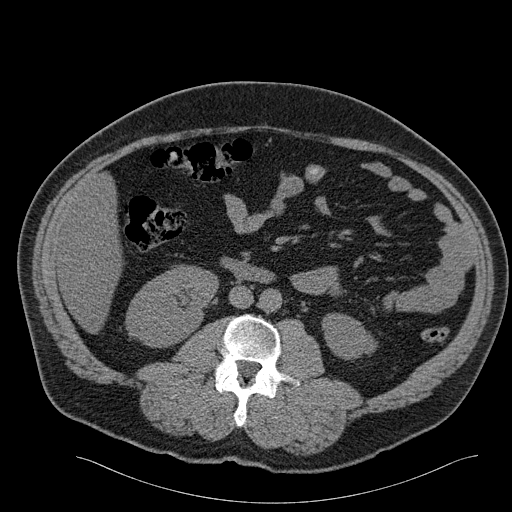
[im 114/178  soft-tissue]
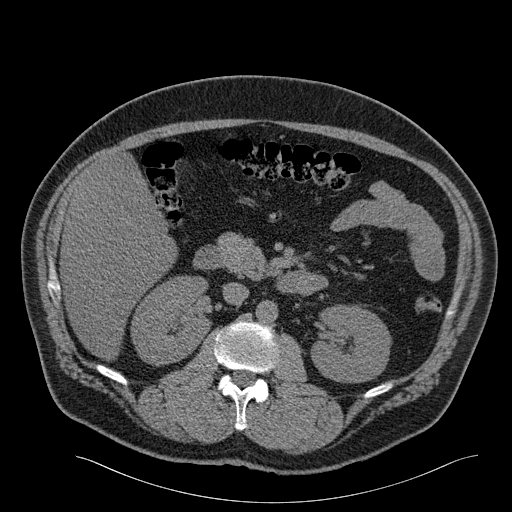
[im 114/178  bone]
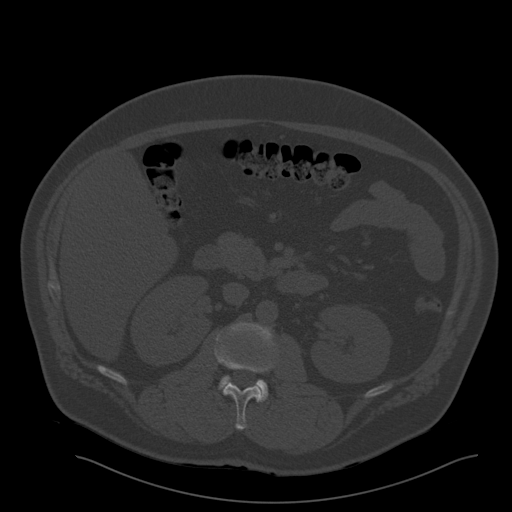
[im 128/178  soft-tissue]
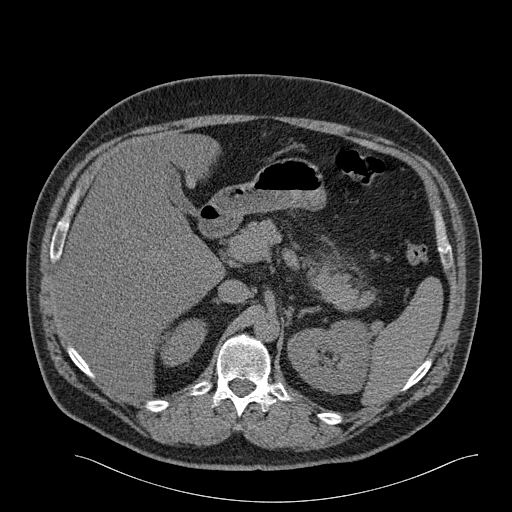
[im 142/178  soft-tissue]
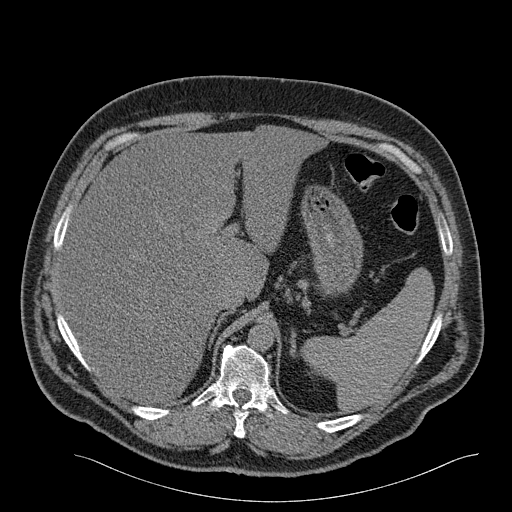
[im 149/178  lung]
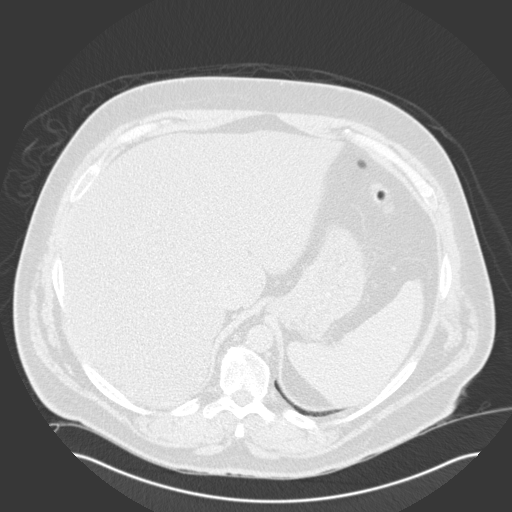
[im 156/178  soft-tissue]
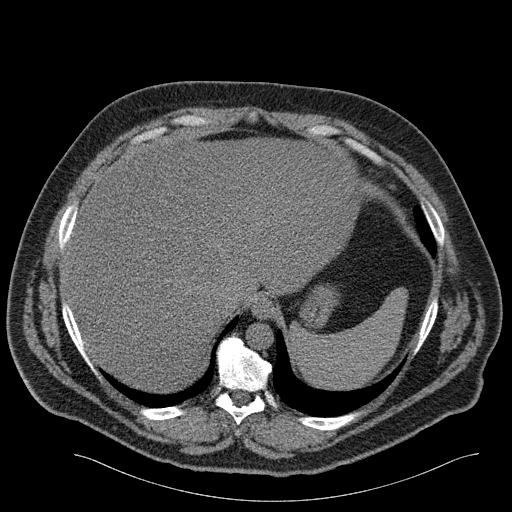
[im 156/178  lung]
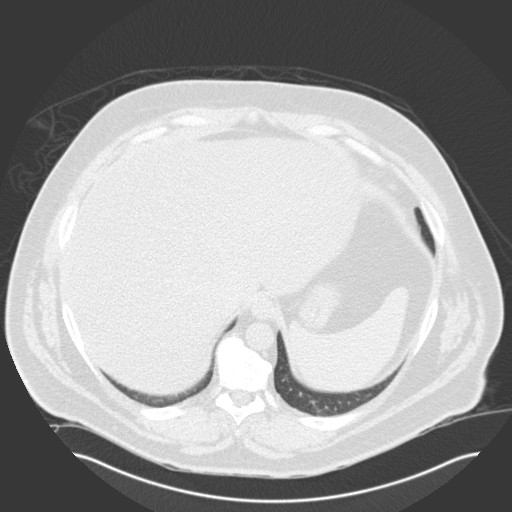
[im 163/178  lung]
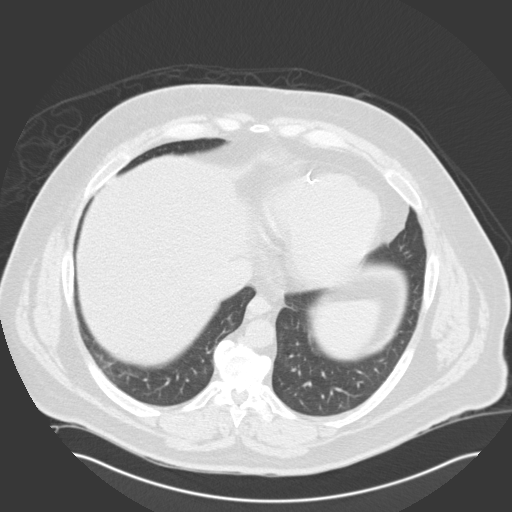
[im 170/178  soft-tissue]
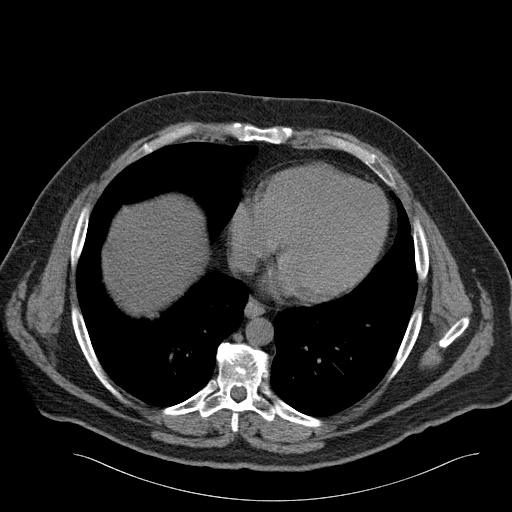
[im 170/178  lung]
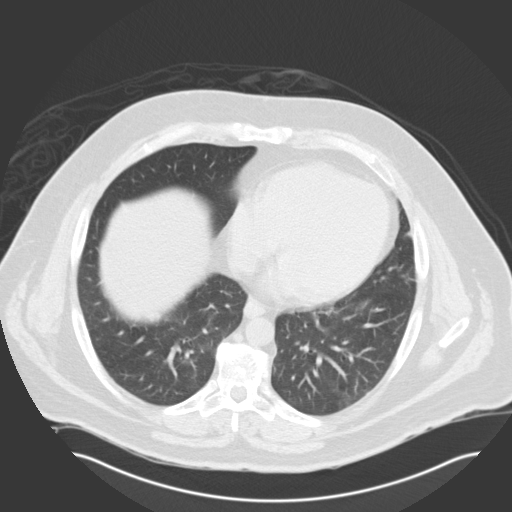

[15 of 32 positions shown; findings below may reference images not displayed]

FINDINGS: Lack of intravenous contrast limits evaluation of the solid abdominal
organs.  The liver is somewhat low in attenuation, suggesting hepatic
steatosis, as seen on the prior study. Mild increased density along the
gallbladder fossa likely represents focal fatty sparing. The gallbladder is
decompressed. The spleen, adrenals are unremarkable. There is mild fat
stranding surrounding the body and tail of the pancreas. No defined fluid
collection identified.

No renal calculi or hydronephrosis identified.

No mesenteric or retroperitoneal lymphadenopathy. The small and large bowel
are normal in caliber. The appendix is normal.

No aggressive lytic or sclerotic osseous lesions identified.
IMPRESSION: Findings consistent with acute pancreatitis. No peripancreatic fluid
collection at this time.

## 2012-08-01 IMAGING — CR DG KNEE COMPLETE 4+V*R*
1 series · 4 of 4 positions shown · non-contrast
Comparison: none

REASON FOR EXAM: pain arthritis
COMMENTS:

[Series 1: view not recorded · 0.17mm/px · 4 of 4 slices shown]
[im 1/4]
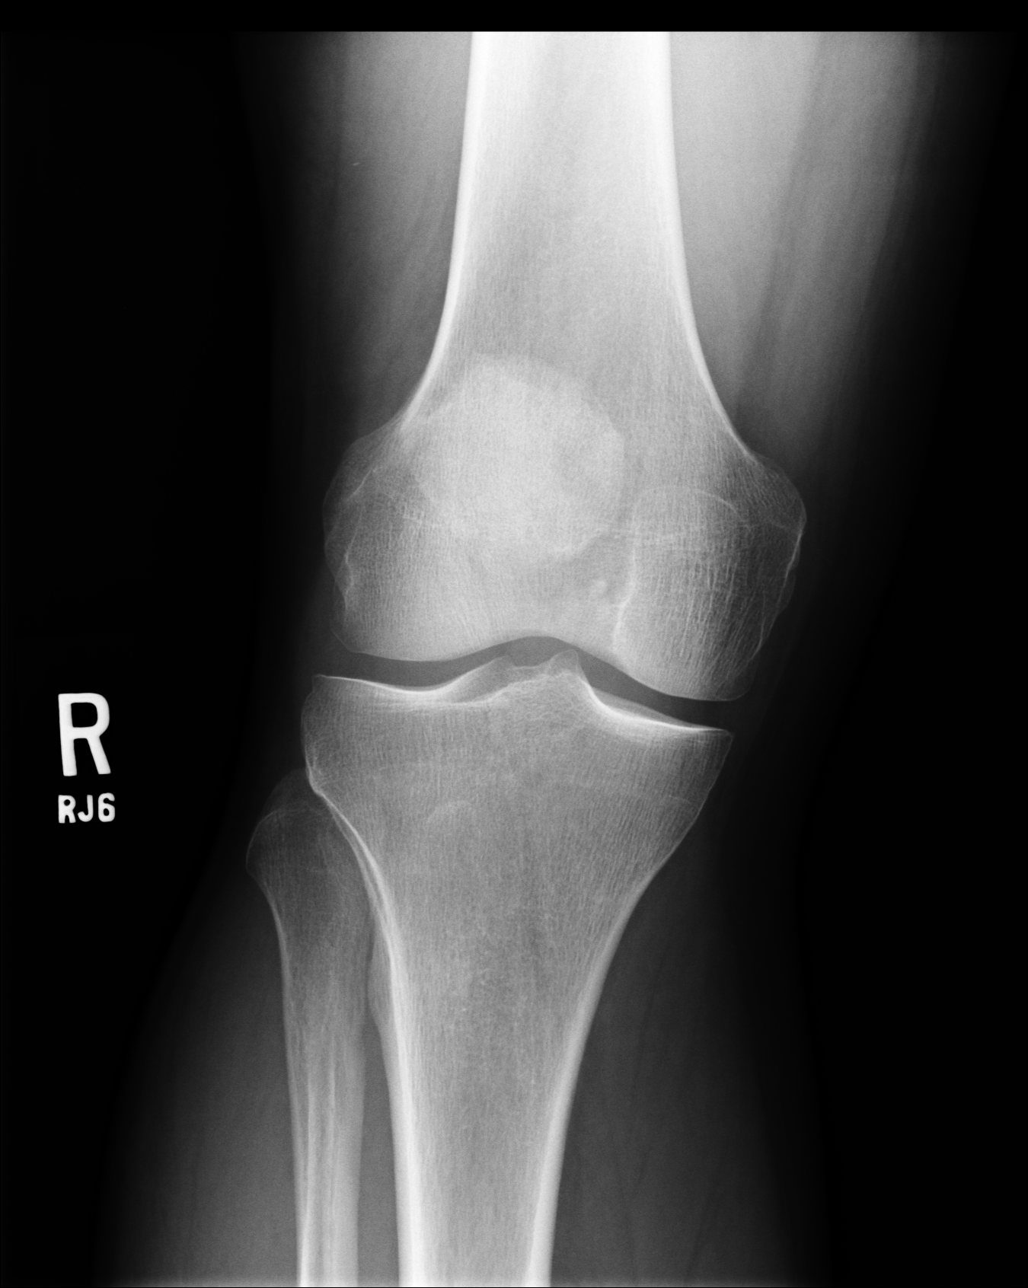
[im 2/4]
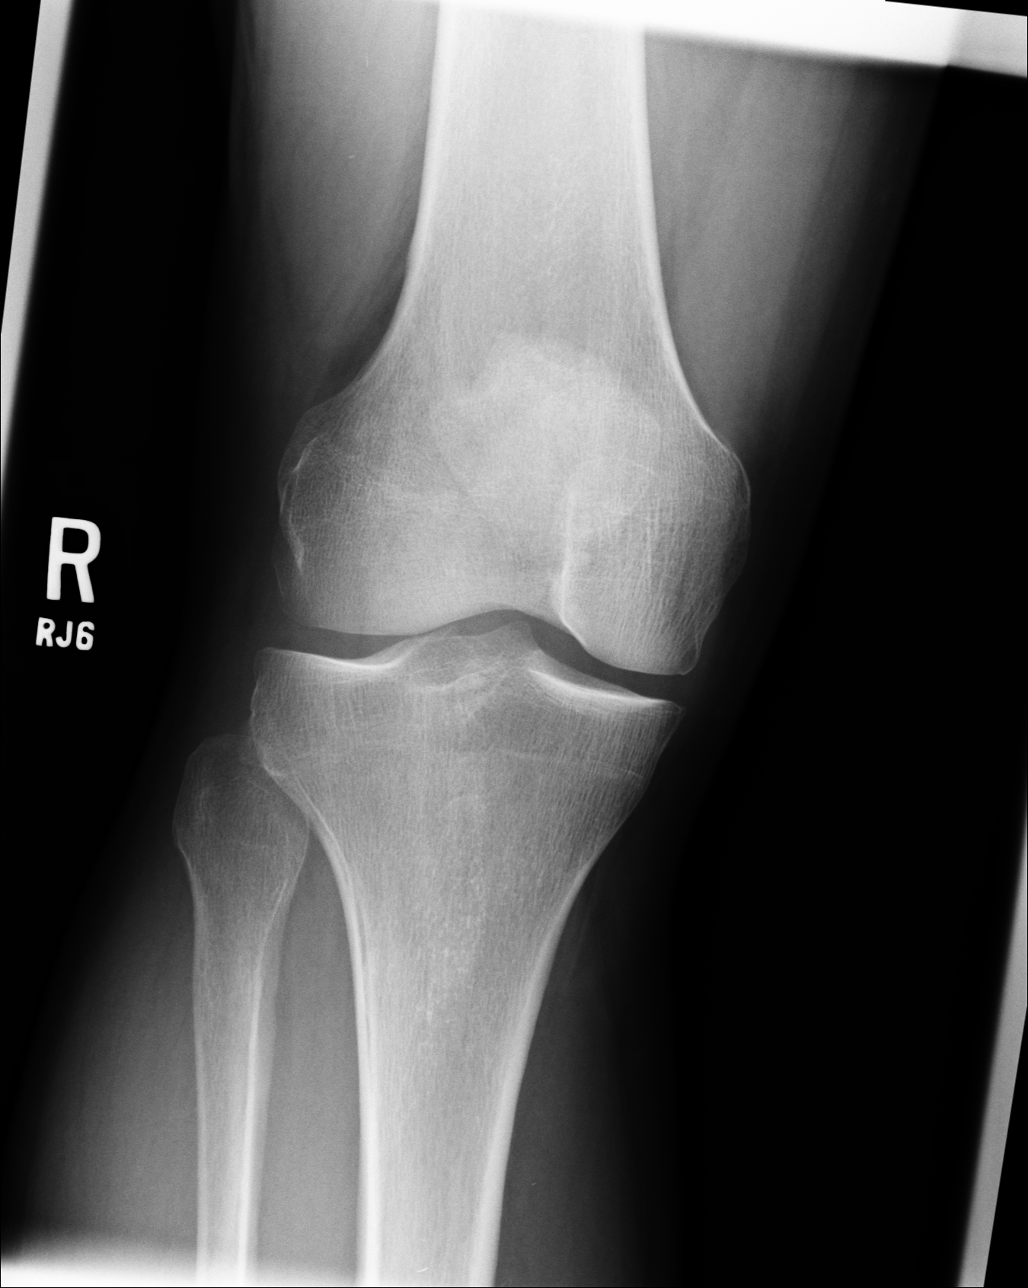
[im 3/4]
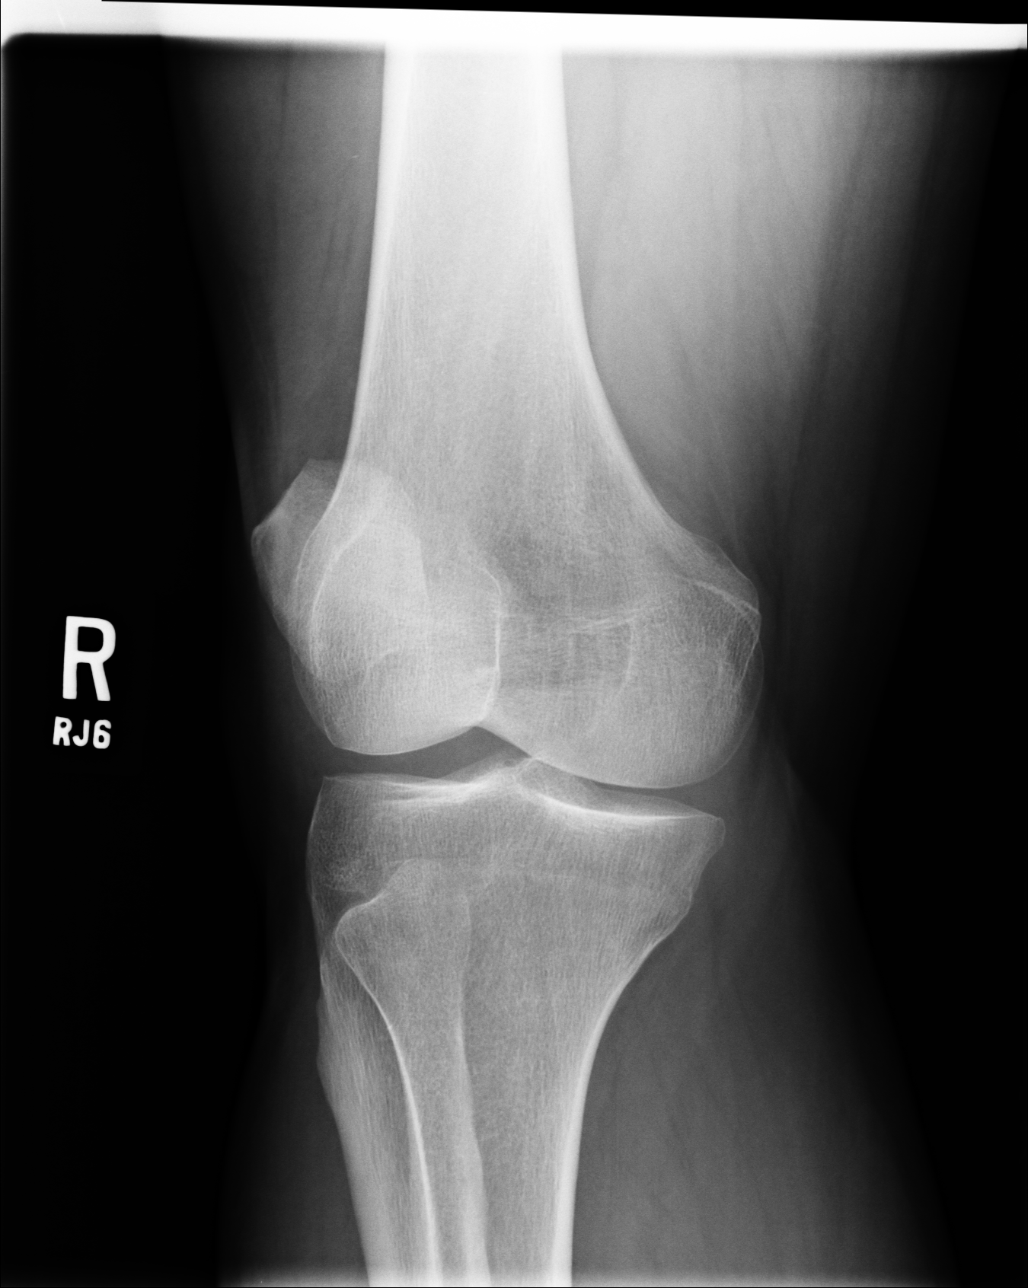
[im 4/4]
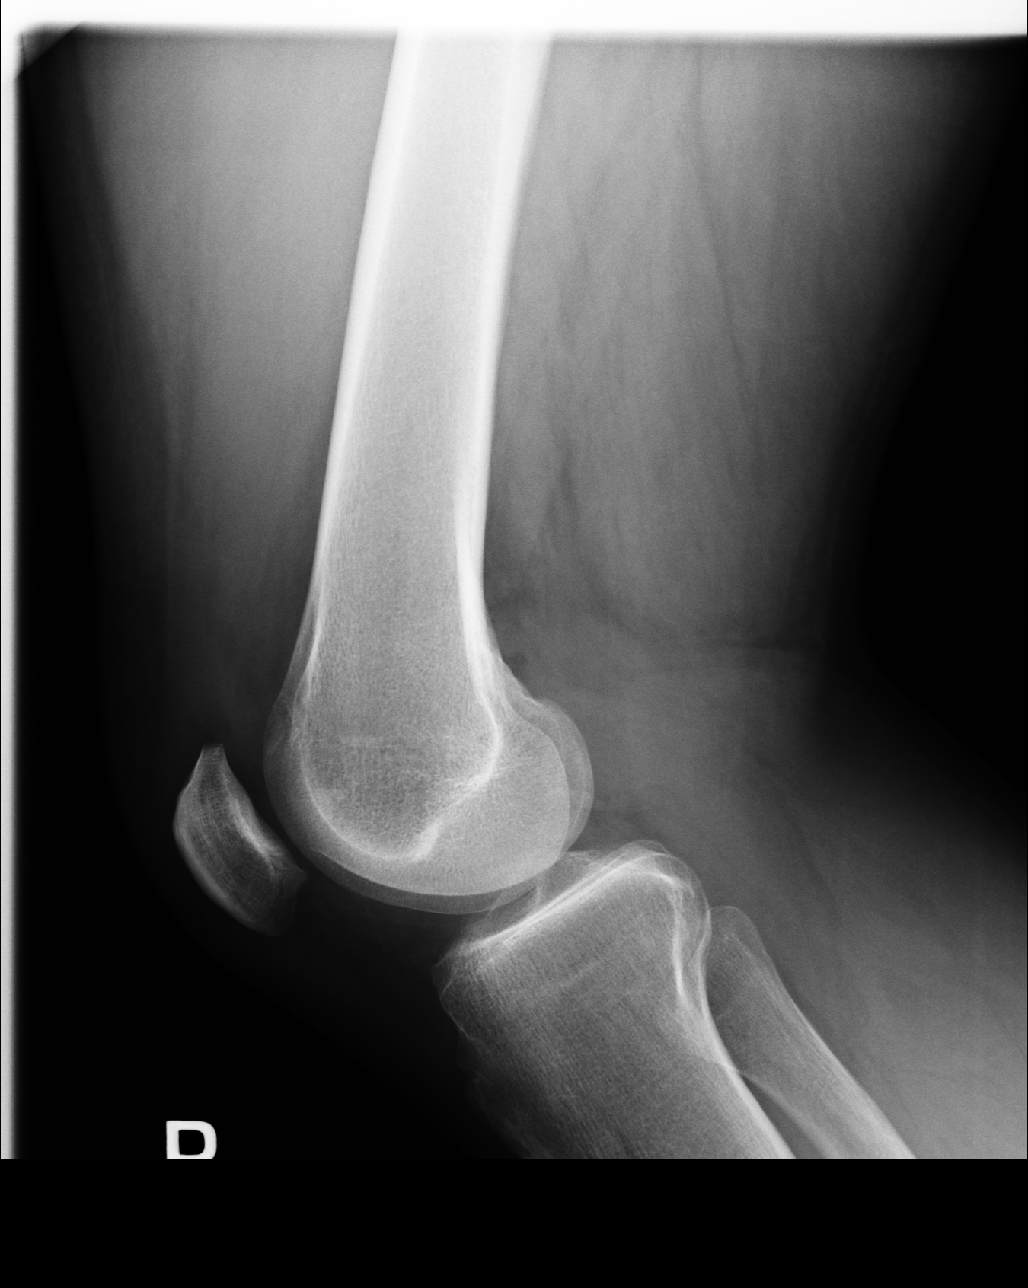

[4 of 4 positions shown; findings below may reference images not displayed]

PROCEDURE:     DXR - DXR KNEE RT COMP WITH OBLIQUES  - February 08, 2011  [DATE]

RESULT:     No fracture, dislocation or other acute bony abnormality is
identified. There is very slight spur formation of the proximal tibia
medially and laterally. The findings are barely perceptible and are similar
to the changes observed on the left which was also radiographed on this day.
There is no narrowing of the knee joint space. No sclerosis or cystic
changes about the knee joint are seen. The patella is intact.
IMPRESSION: There is very slight spur formation at the knee joint
medially and laterally. Otherwise, normal study.

## 2012-08-01 IMAGING — CR DG THORACIC SPINE 2-3V
1 series · 3 of 3 positions shown · non-contrast
Comparison: none

REASON FOR EXAM: pain arthritis
COMMENTS:

[Series 1: view not recorded · 0.17mm/px · 3 of 3 slices shown]
[im 1/3]
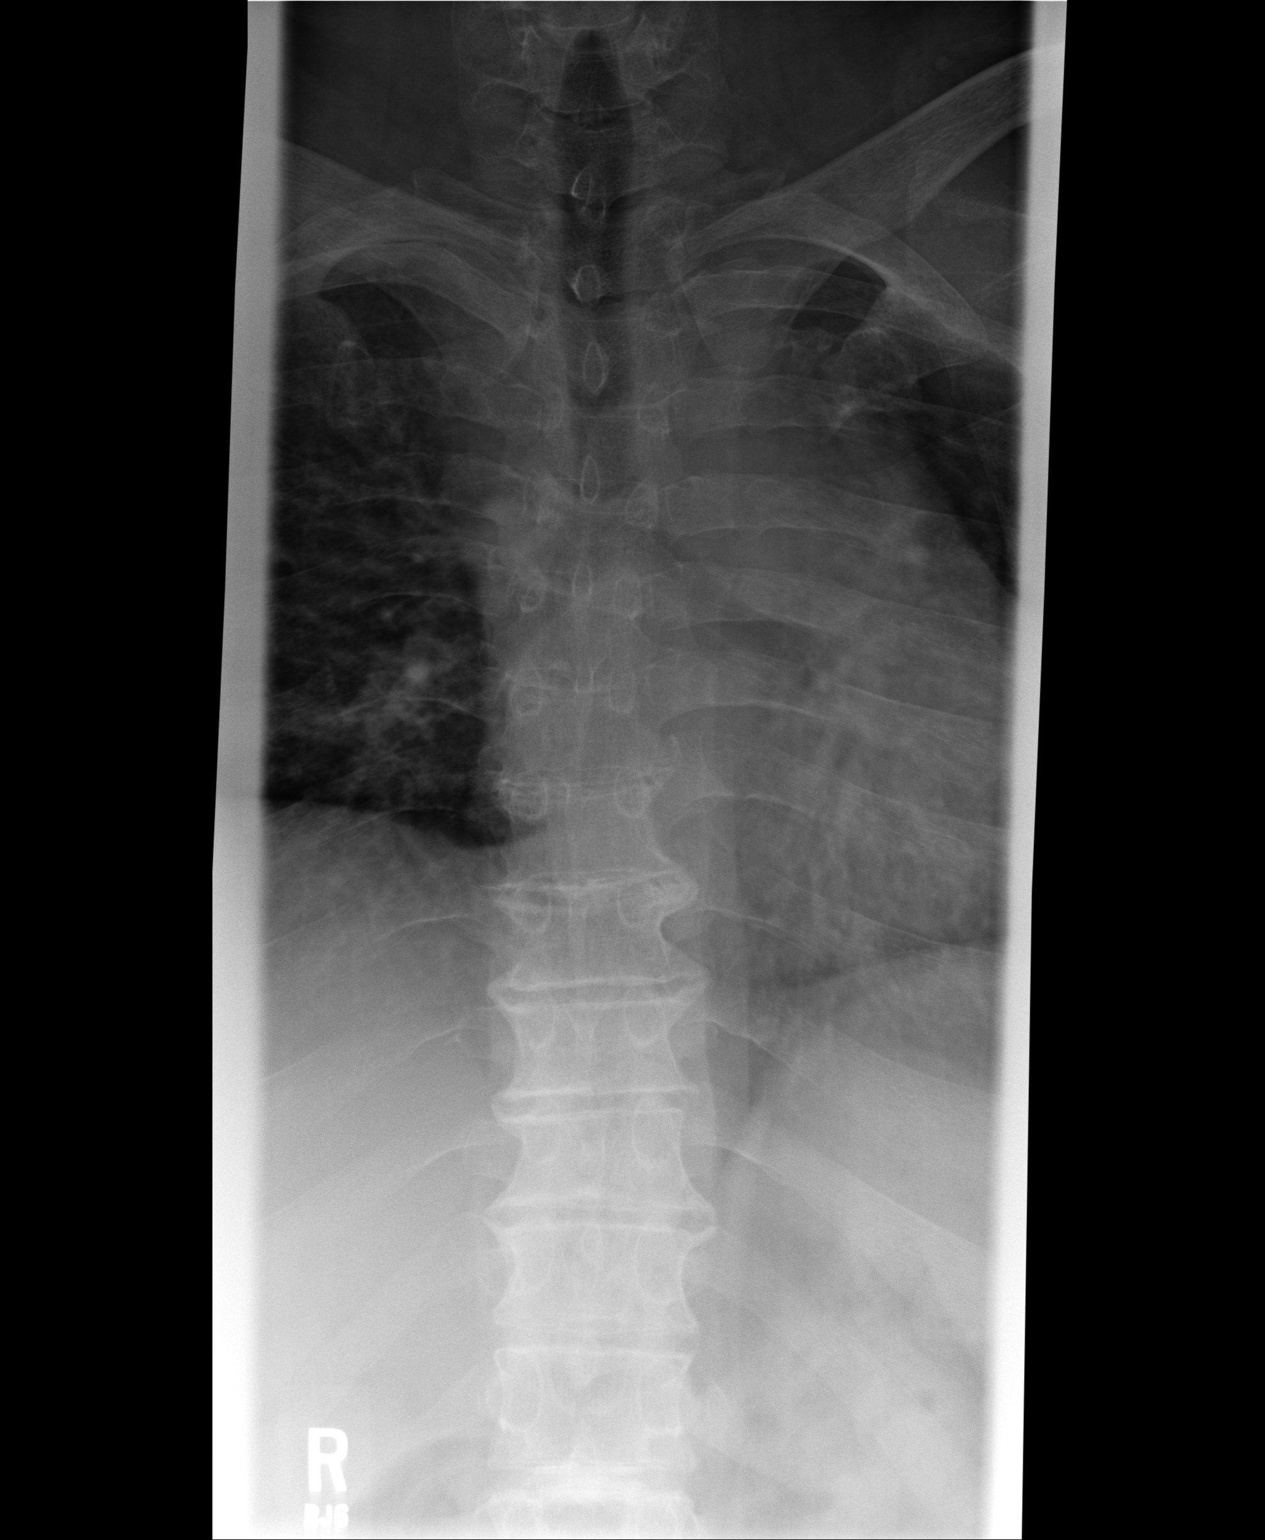
[im 2/3]
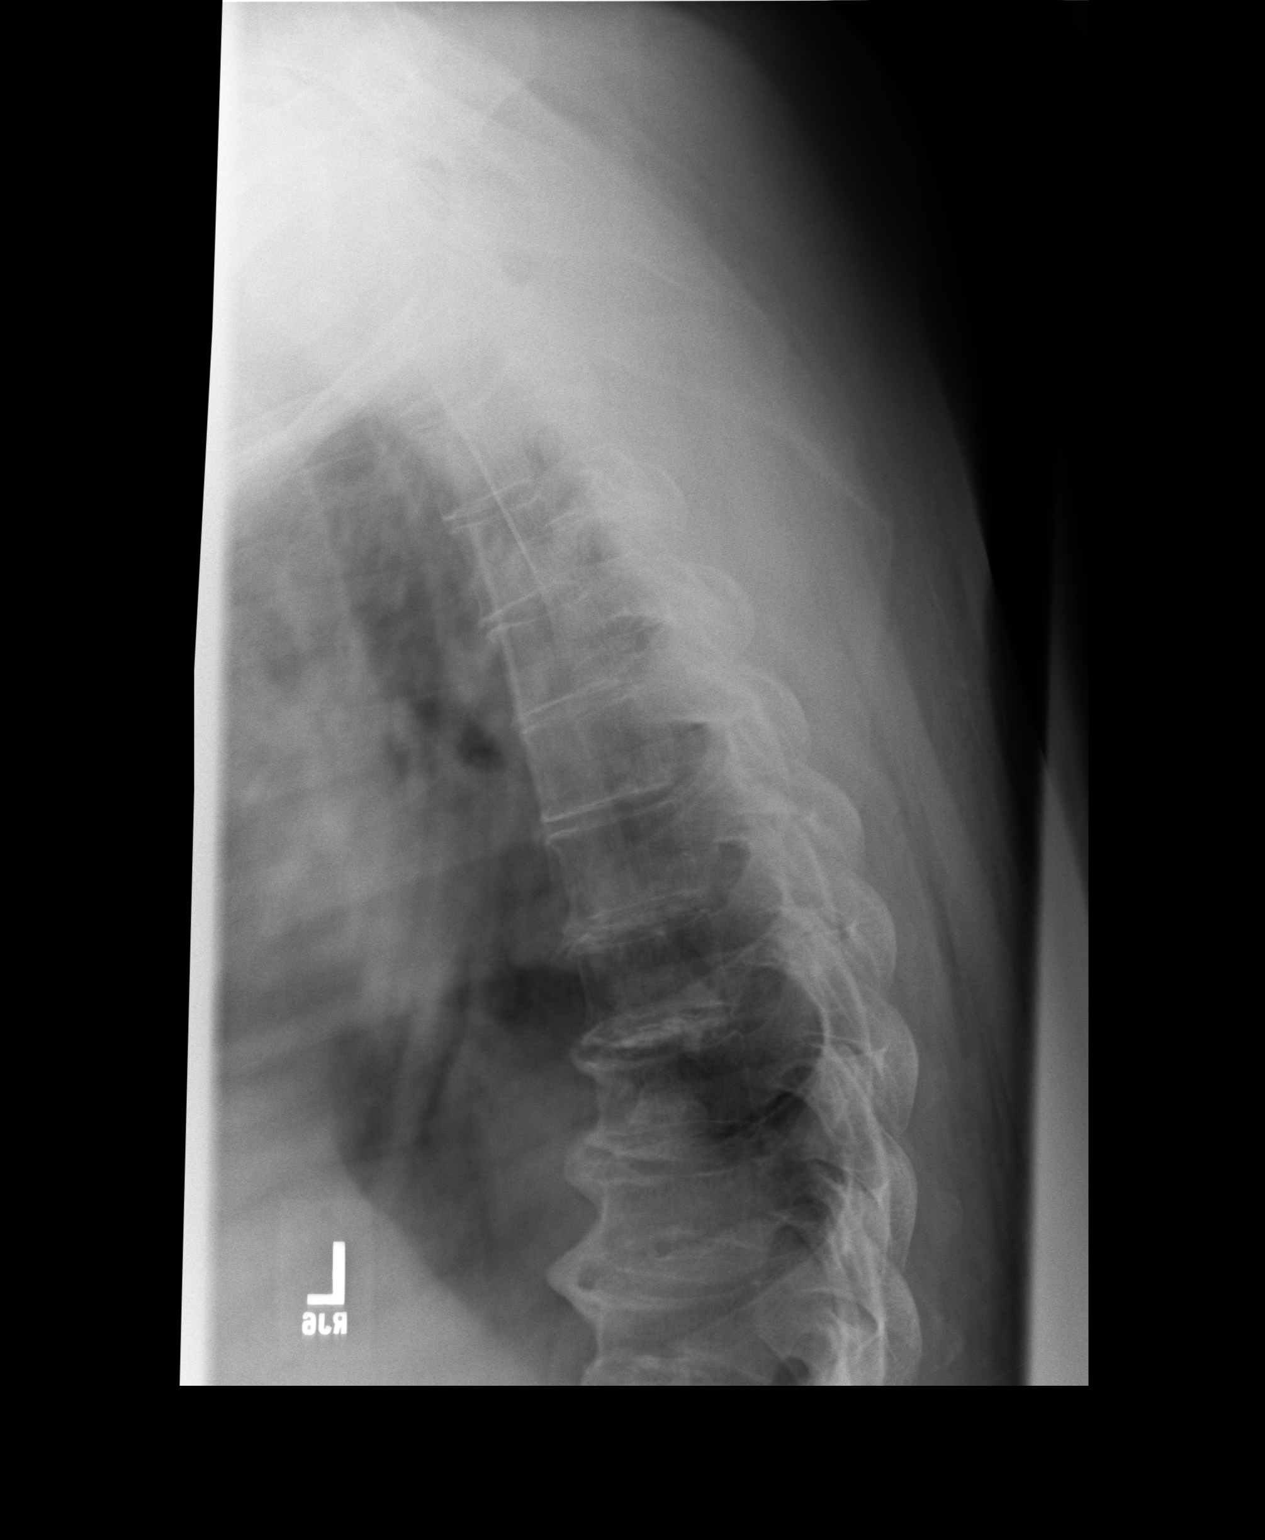
[im 3/3]
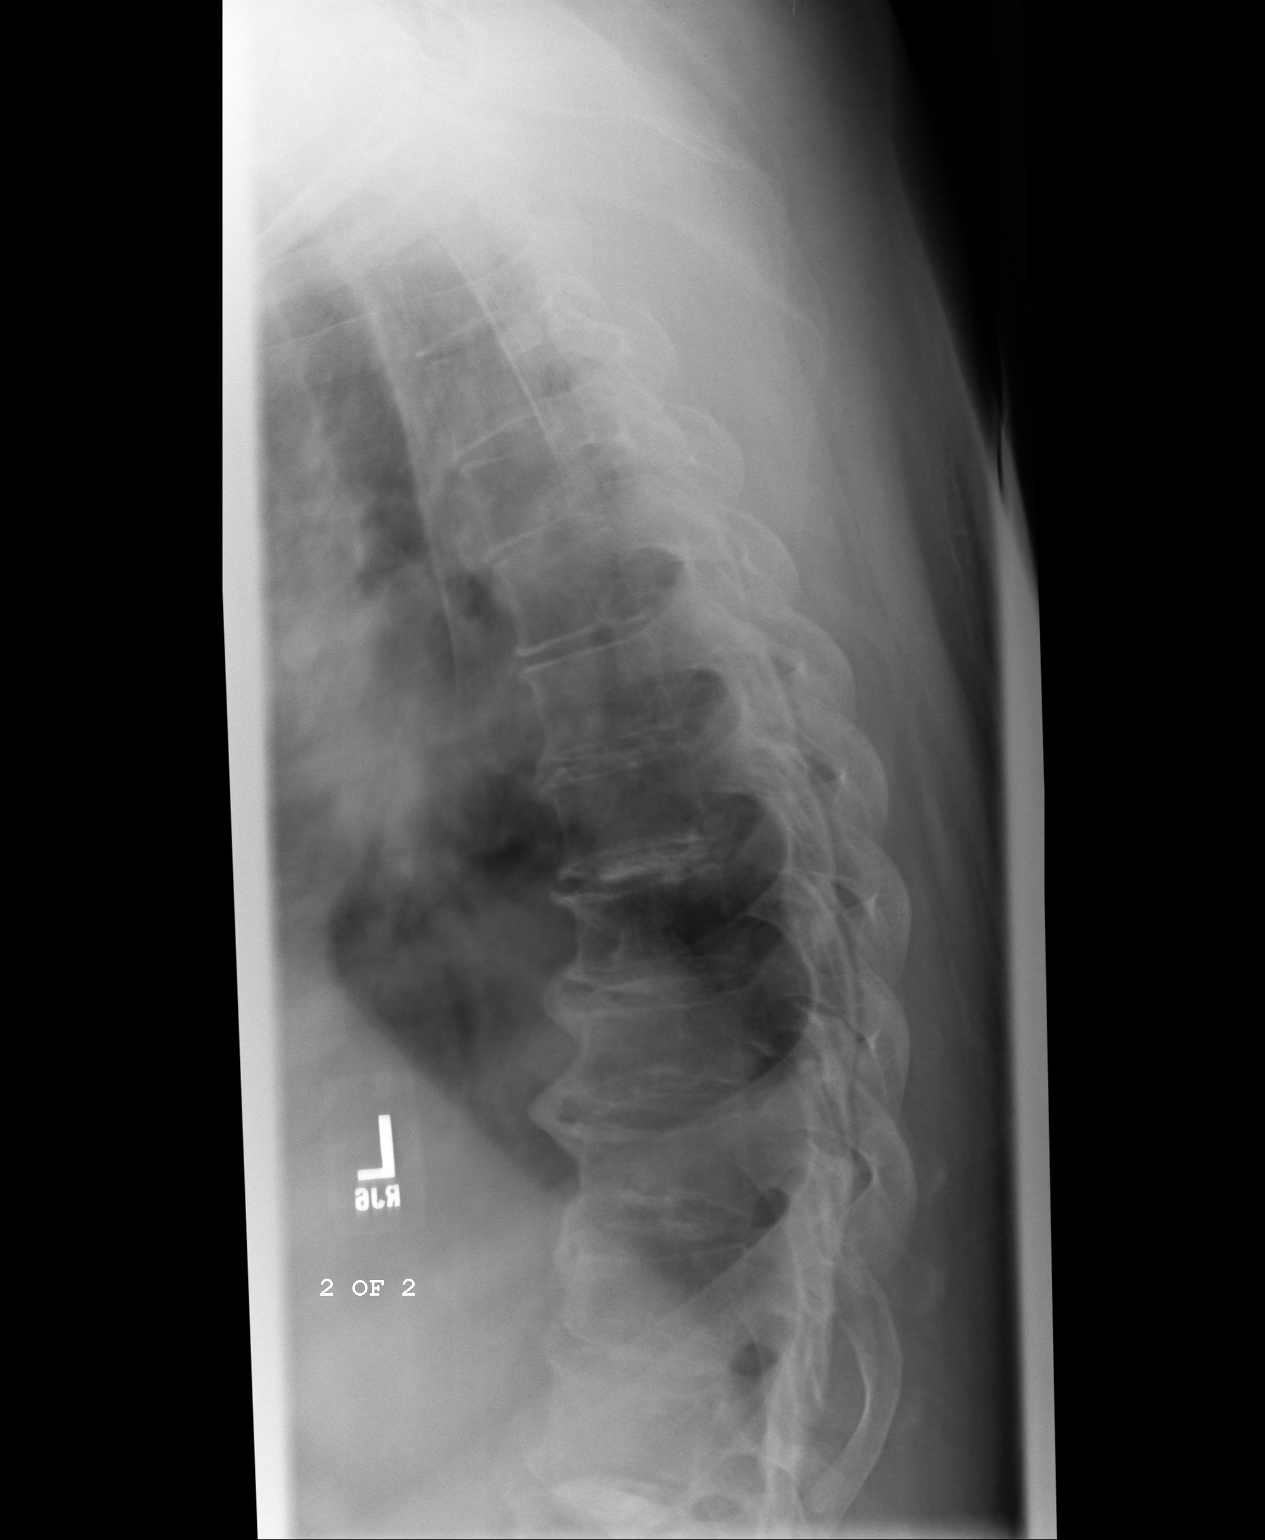

[3 of 3 positions shown; findings below may reference images not displayed]

PROCEDURE:     DXR - DXR THORACIC  AP AND LATERAL  - February 08, 2011  [DATE]

RESULT:     There are slight central compression deformities of T9, T10 and
T11. No acute vertebral body fracture is seen. Vertebral body alignment is
normal. The pedicles are bilaterally intact. Hypertrophic spurring is noted
anteriorly at multiple levels of the lower thoracic spine. No lytic or
blastic lesions are seen.
IMPRESSION: 1.  No acute changes are identified.
2.  There are slight old appearing central compression deformities at
approximately T9, T10 and T11.

## 2012-08-01 IMAGING — CR RIGHT HAND - COMPLETE 3+ VIEW
1 series · 3 of 3 positions shown · non-contrast
Comparison: none

REASON FOR EXAM: pain arthritis
COMMENTS:

PROCEDURE:     DXR - DXR HAND RT COMPLETE W/OBLIQUES  - February 08, 2011  [DATE]
RESULT:     No fracture, dislocation or other acute bony abnormality is
identified. The MP and IP joint spaces are well maintained. No arthritic
changes are identified. No radiodense soft tissue foreign body is seen.

[Series 1: view not recorded · 0.17mm/px · 3 of 3 slices shown]
[im 1/3]
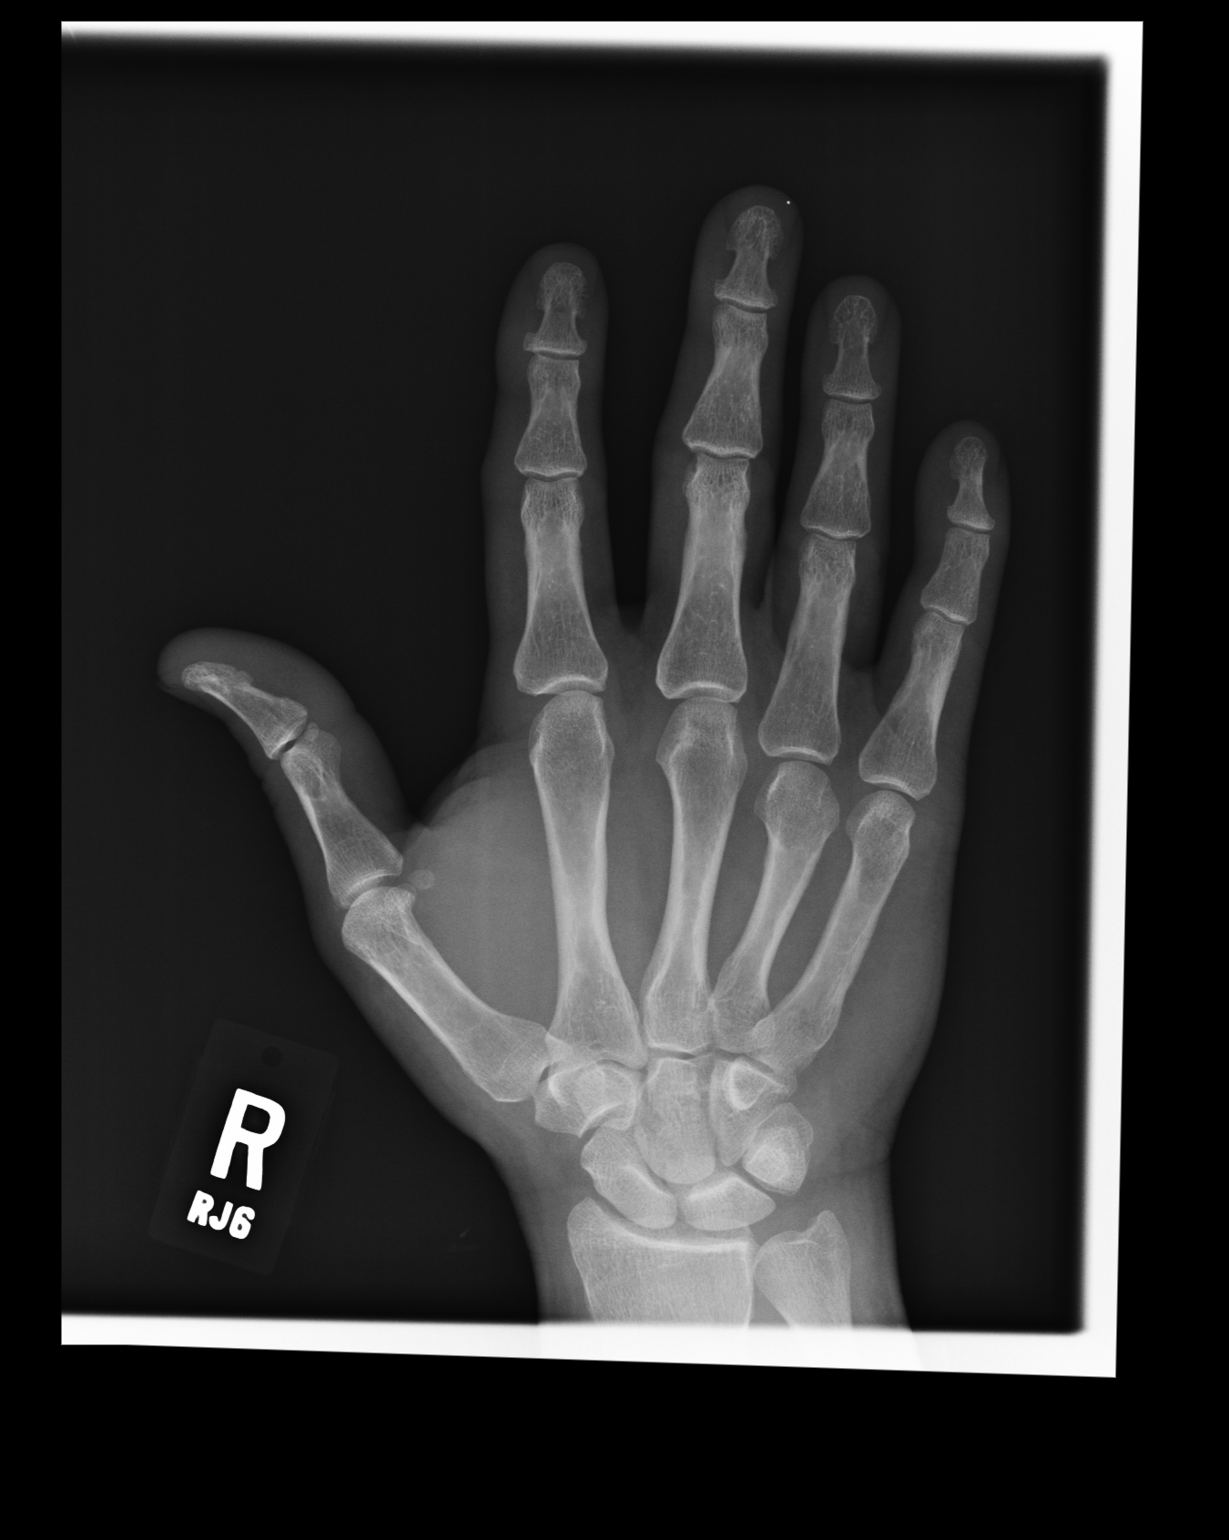
[im 2/3]
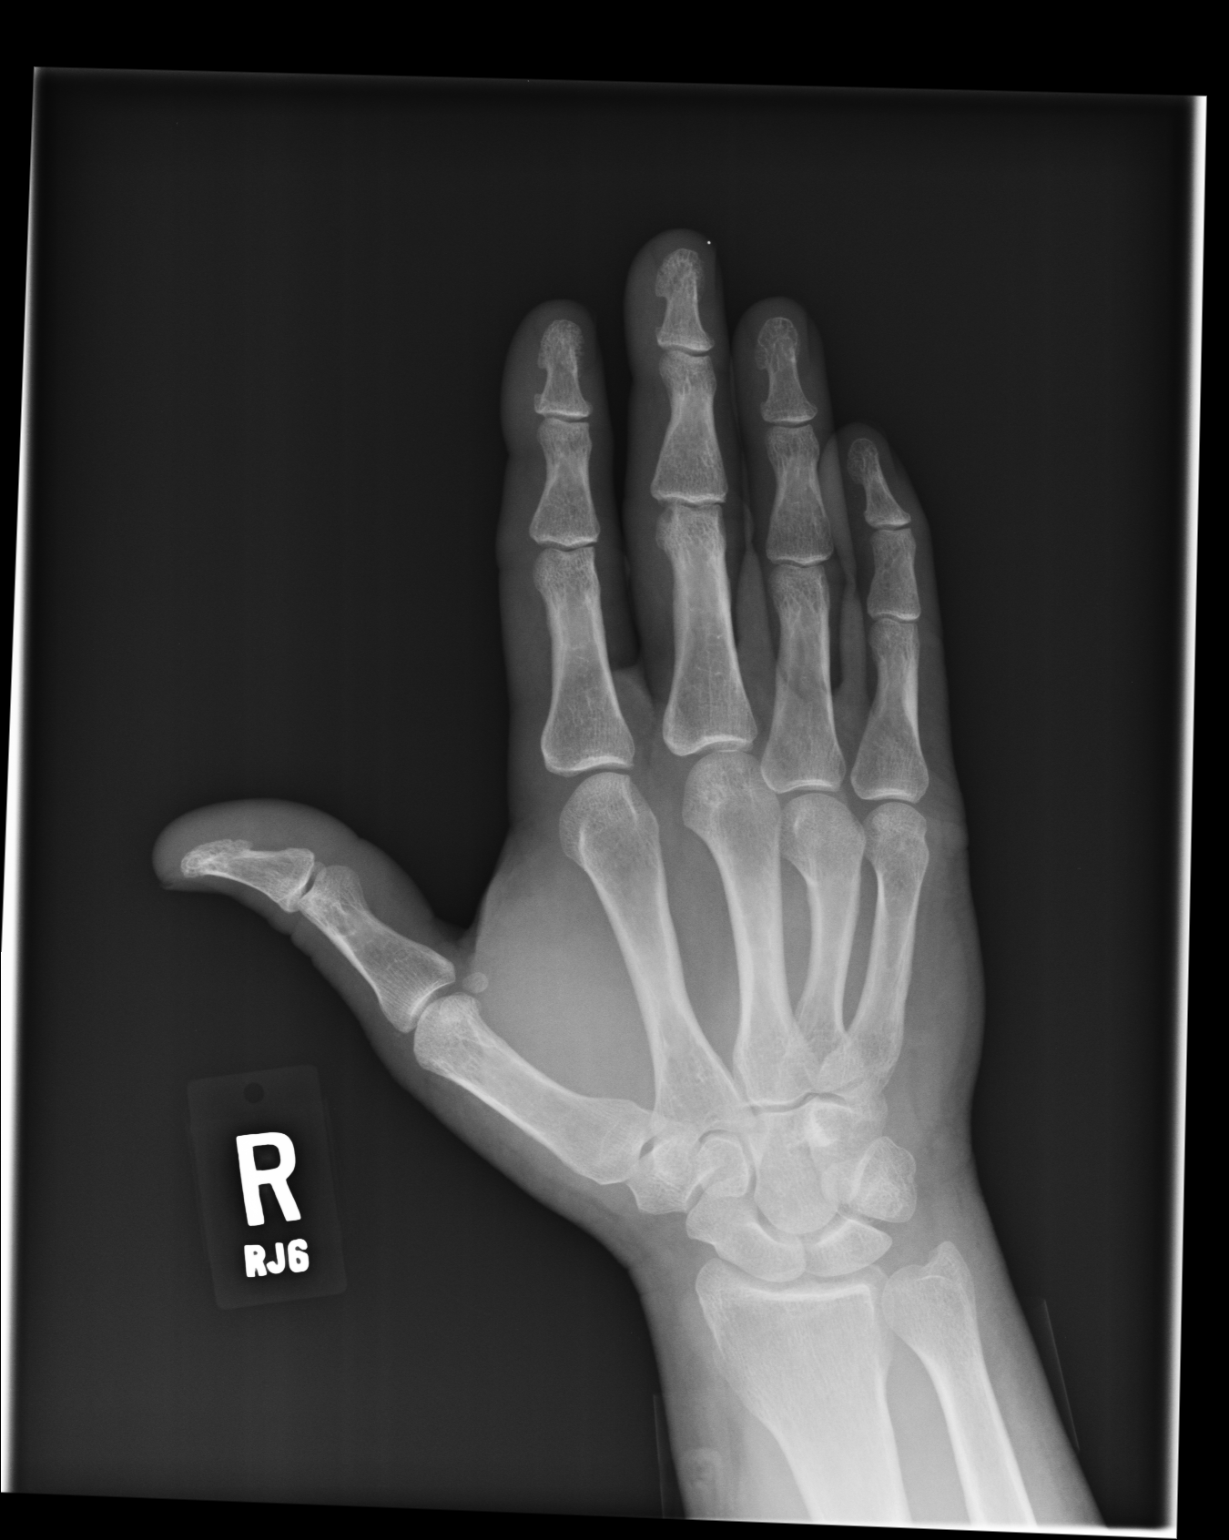
[im 3/3]
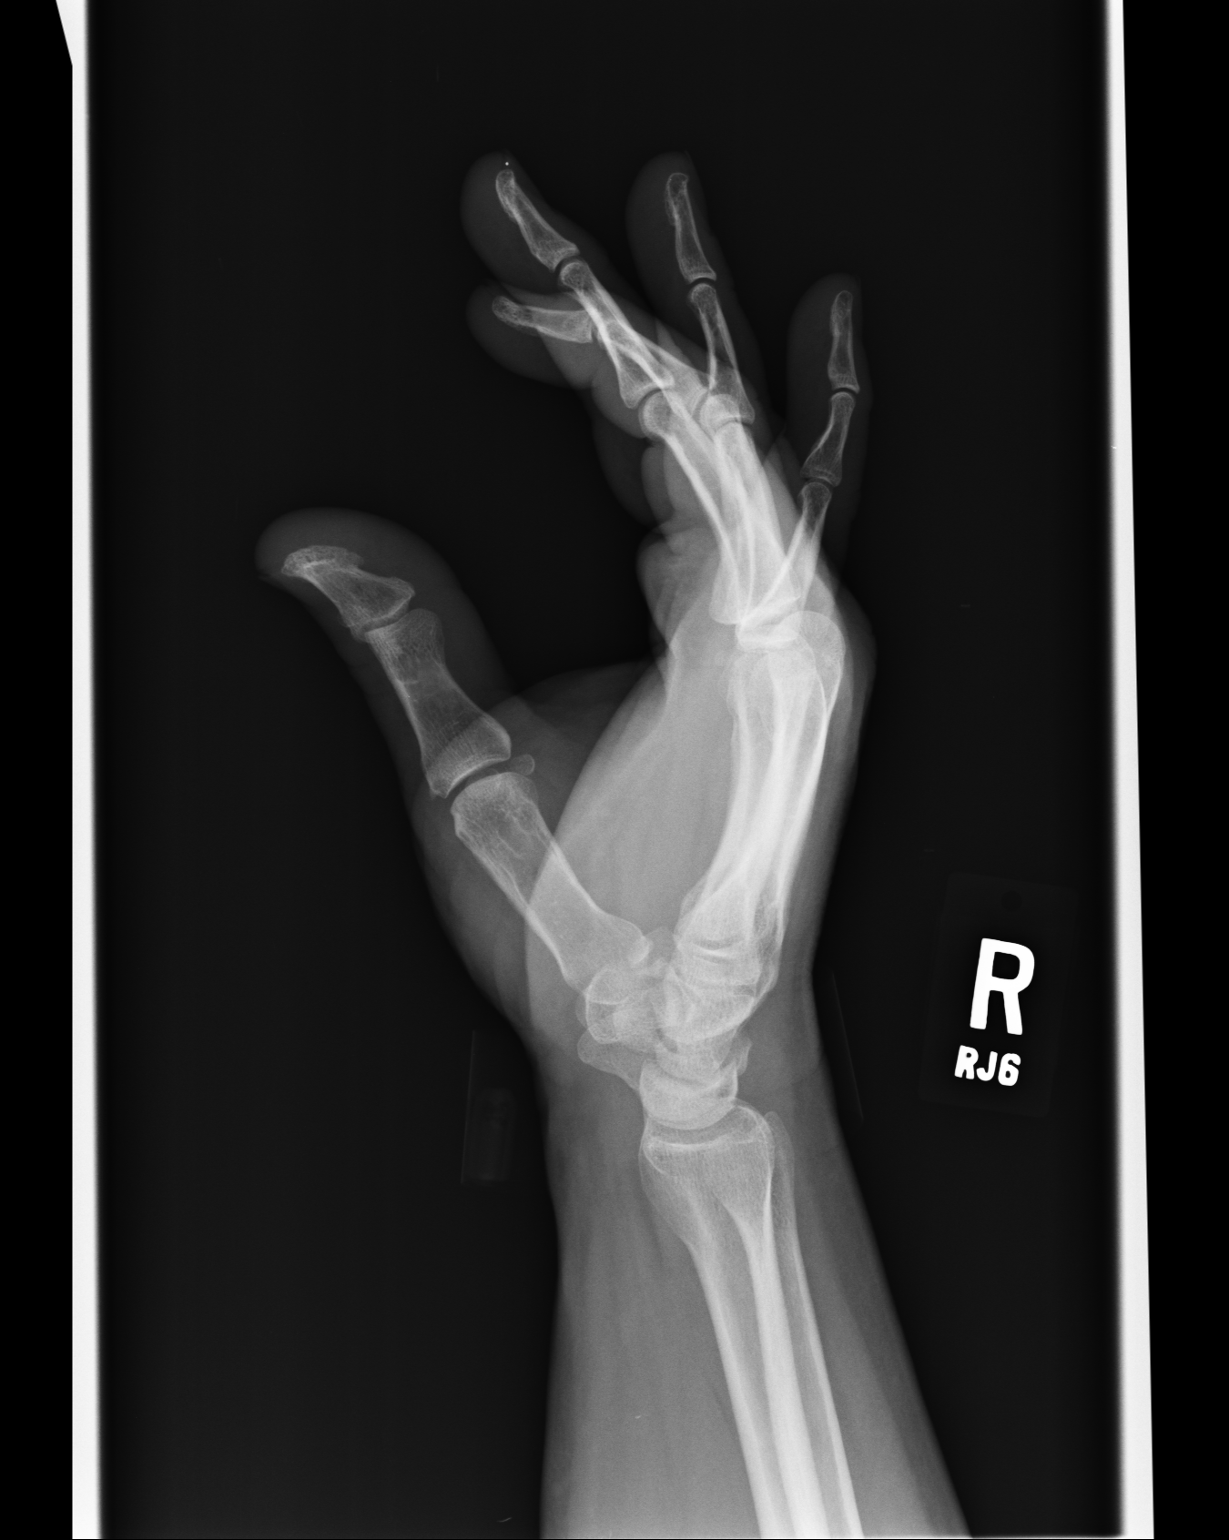

[3 of 3 positions shown; findings below may reference images not displayed]

IMPRESSION: No significant abnormalities are noted.

## 2012-08-01 IMAGING — CR DG HAND COMPLETE 3+V*L*
1 series · 3 of 3 positions shown · non-contrast
Comparison: none

REASON FOR EXAM: pain arthritis
COMMENTS:

[Series 1: view not recorded · 0.17mm/px · 3 of 3 slices shown]
[im 1/3]
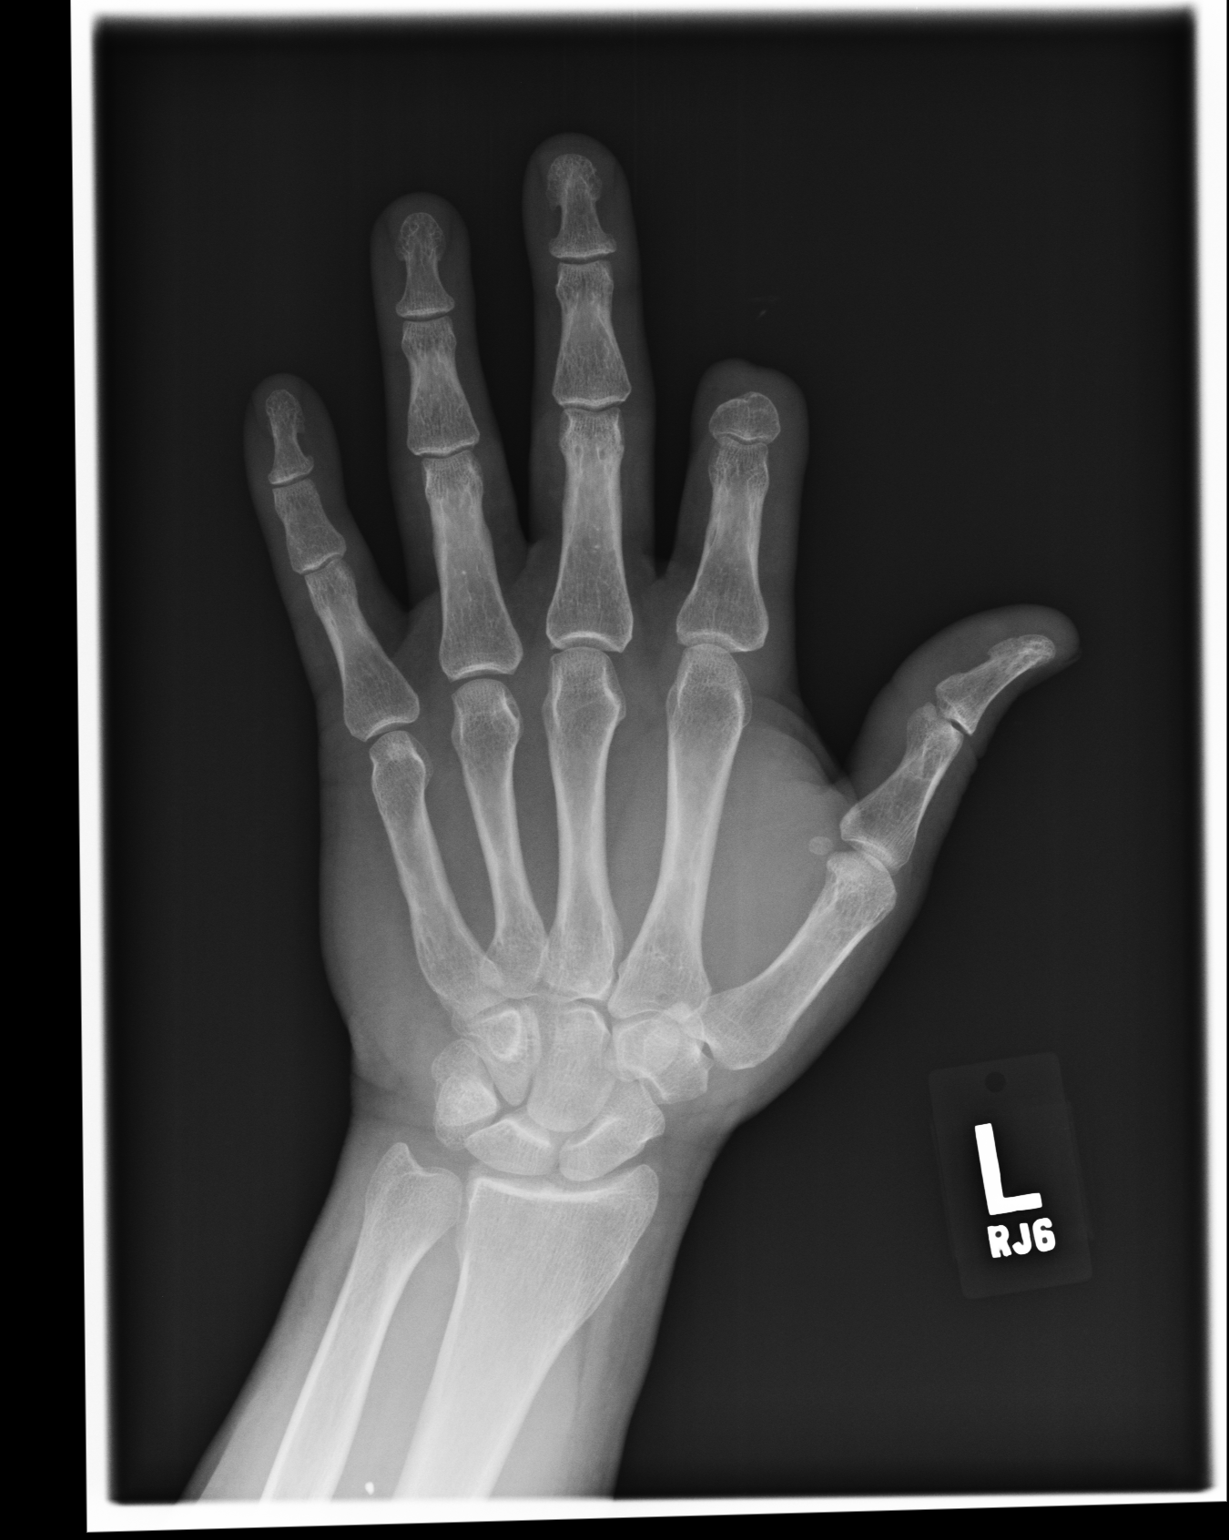
[im 2/3]
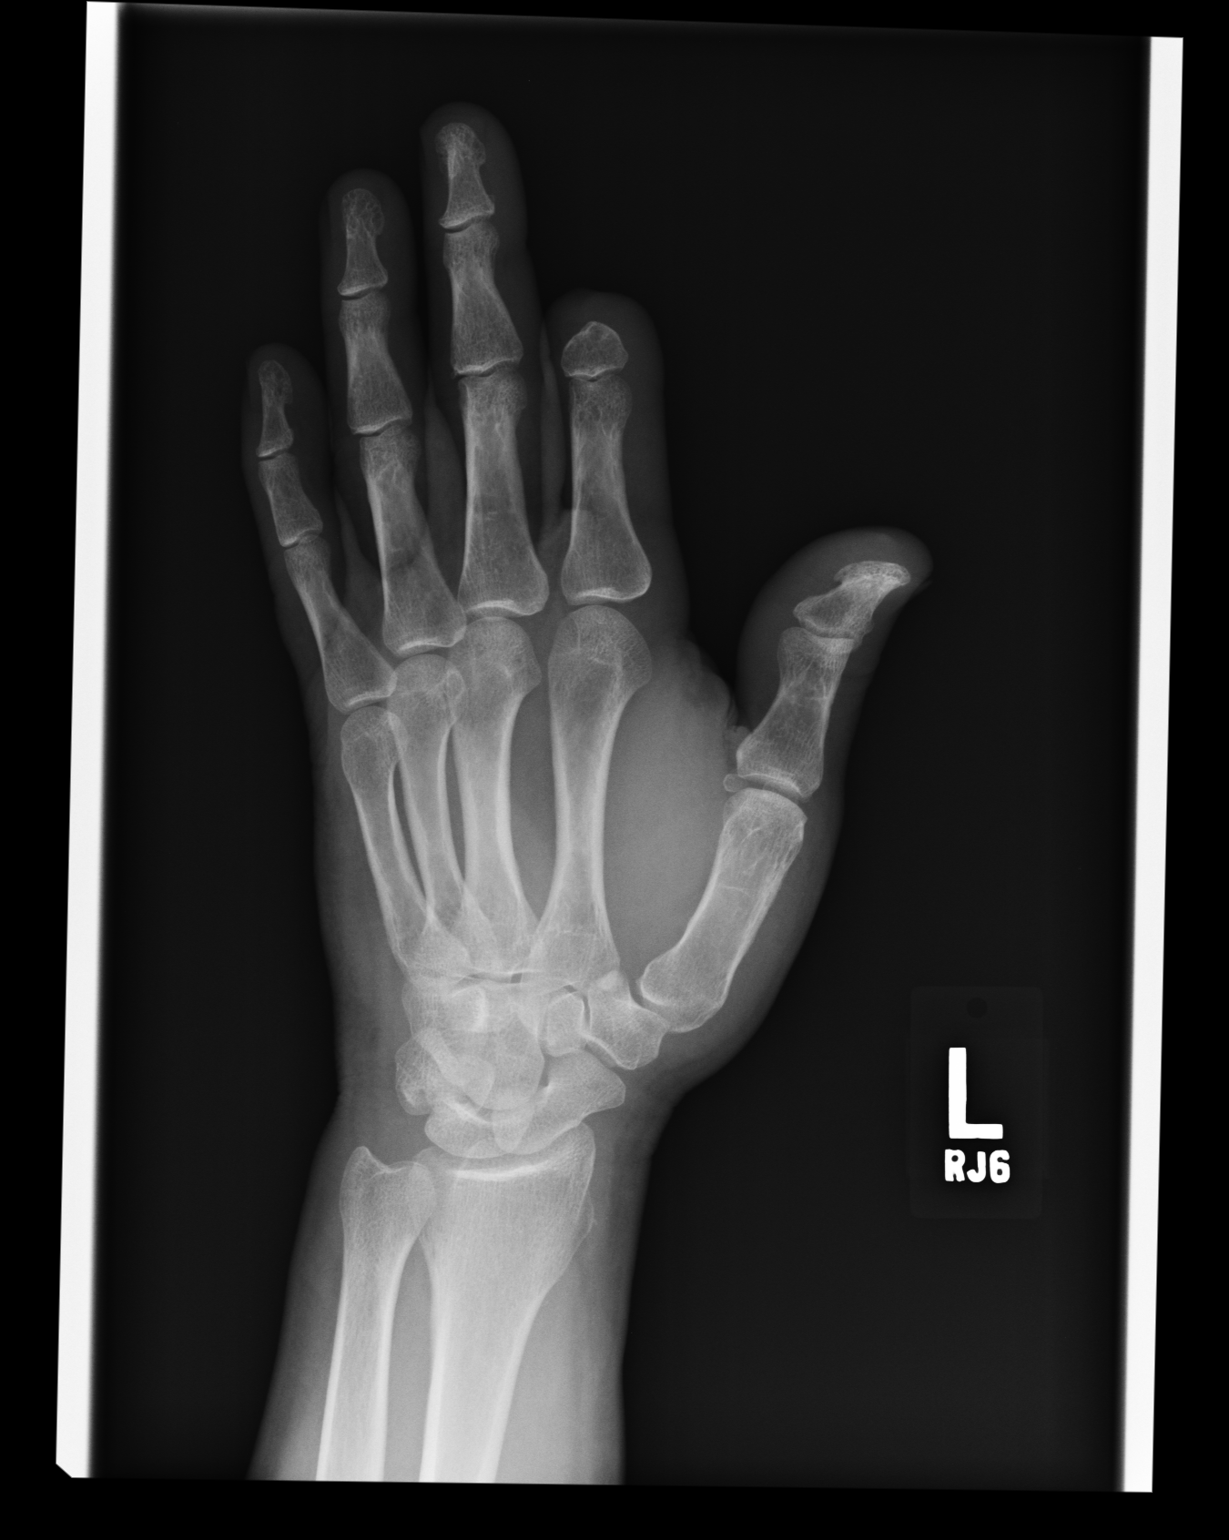
[im 3/3]
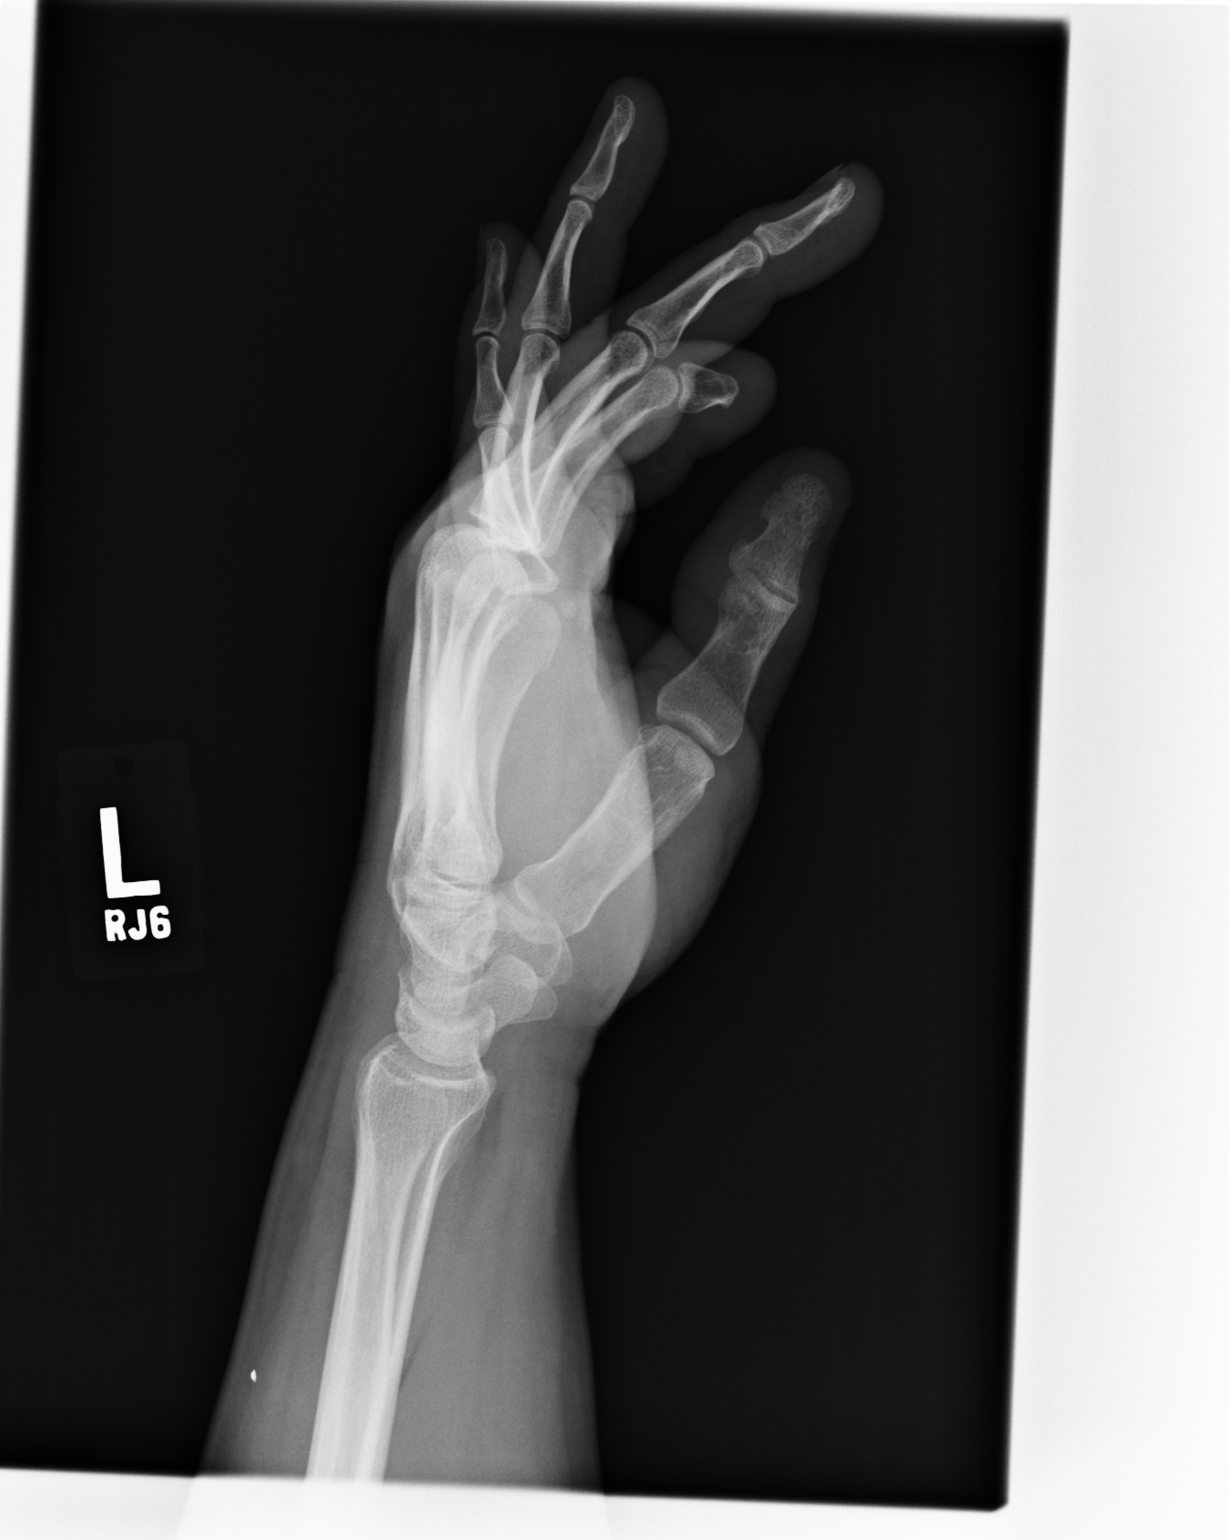

[3 of 3 positions shown; findings below may reference images not displayed]

PROCEDURE:     DXR - DXR HAND LT COMPLETE  W/OBLIQUES  - February 08, 2011  [DATE]

RESULT:     The patient has had prior amputation of the index finger distal
to the proximal portion of the middle phalanx. No acute bony abnormalities
of the left hand are identified. No arthritic changes are seen. No
radiodense soft tissue foreign body is observed.
IMPRESSION: The patient has had prior partial amputation of the left,
index finger. Otherwise, normal study.

## 2012-08-01 IMAGING — CR DG LUMBAR SPINE COMPLETE W/ BEND
1 series · 8 of 9 positions shown · non-contrast
Comparison: none

REASON FOR EXAM: pain arthritis
COMMENTS:

[Series 1: view not recorded · 0.17mm/px · 8 of 9 slices shown]
[im 1/9]
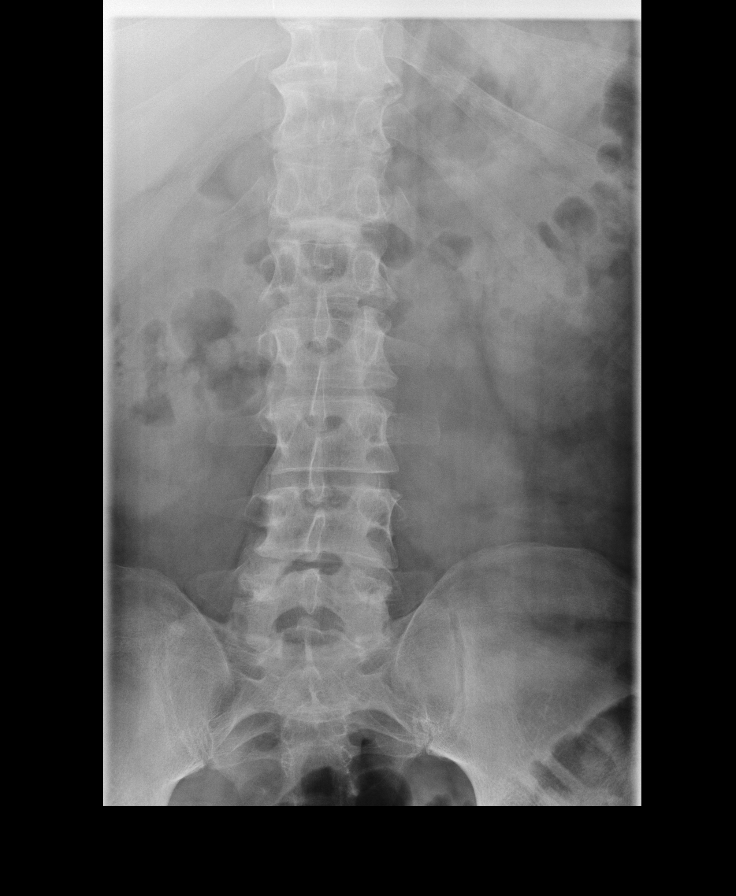
[im 2/9]
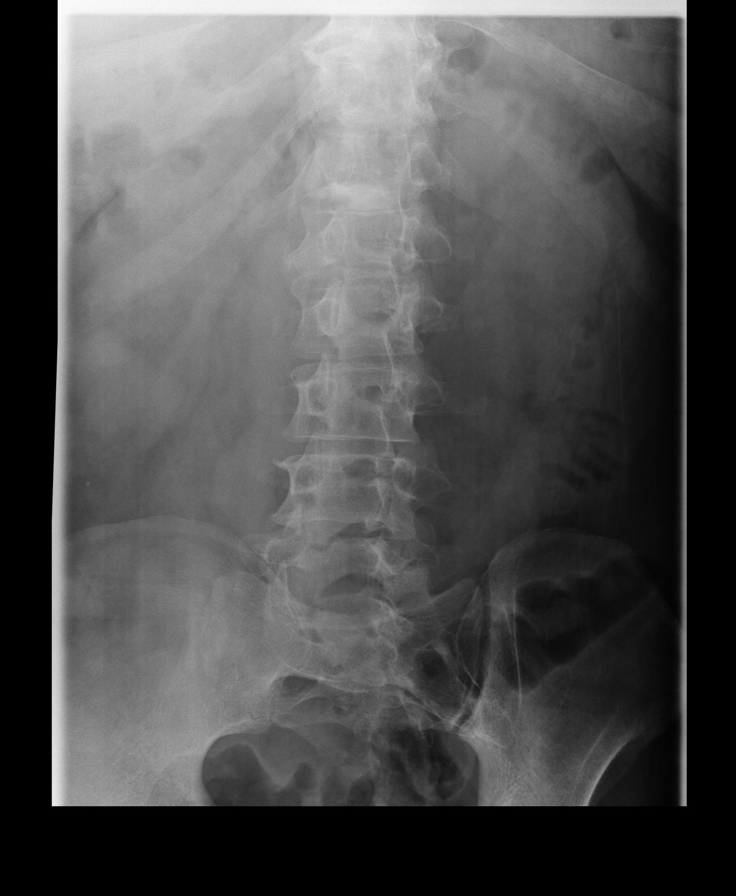
[im 3/9]
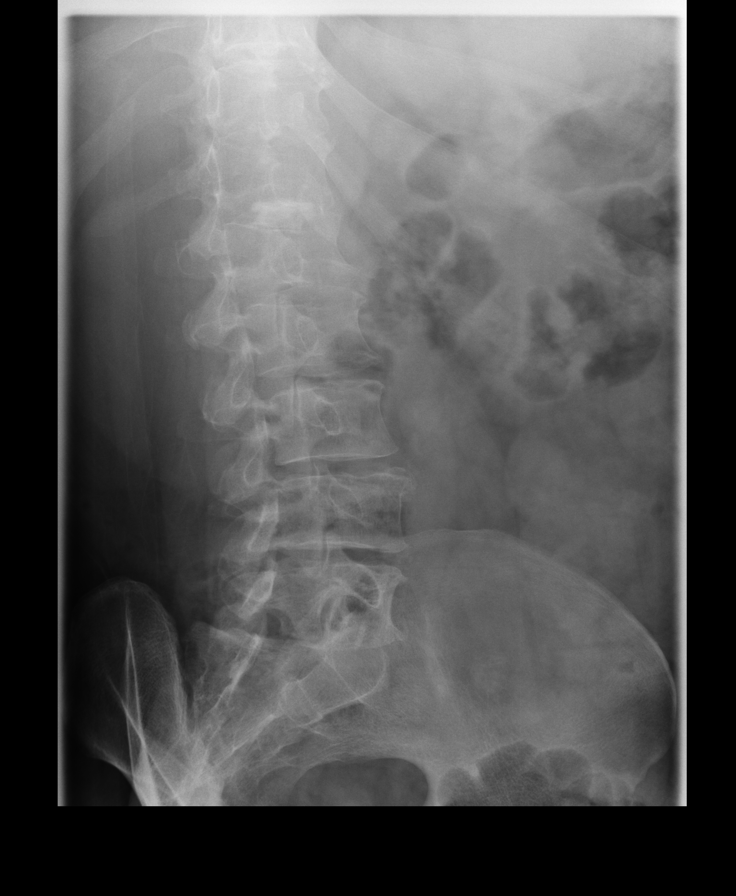
[im 4/9]
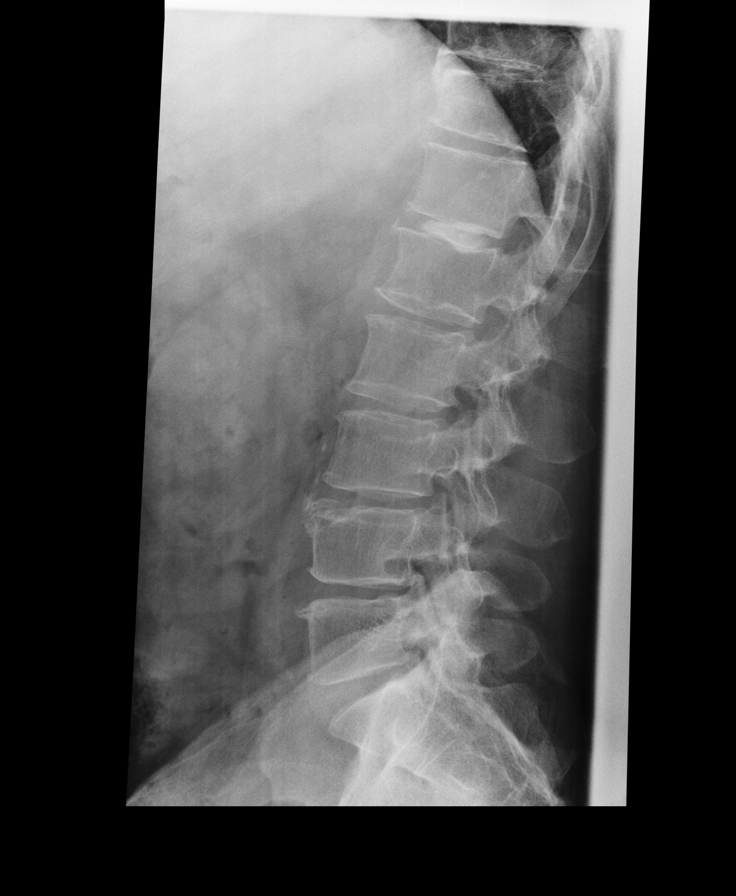
[im 5/9]
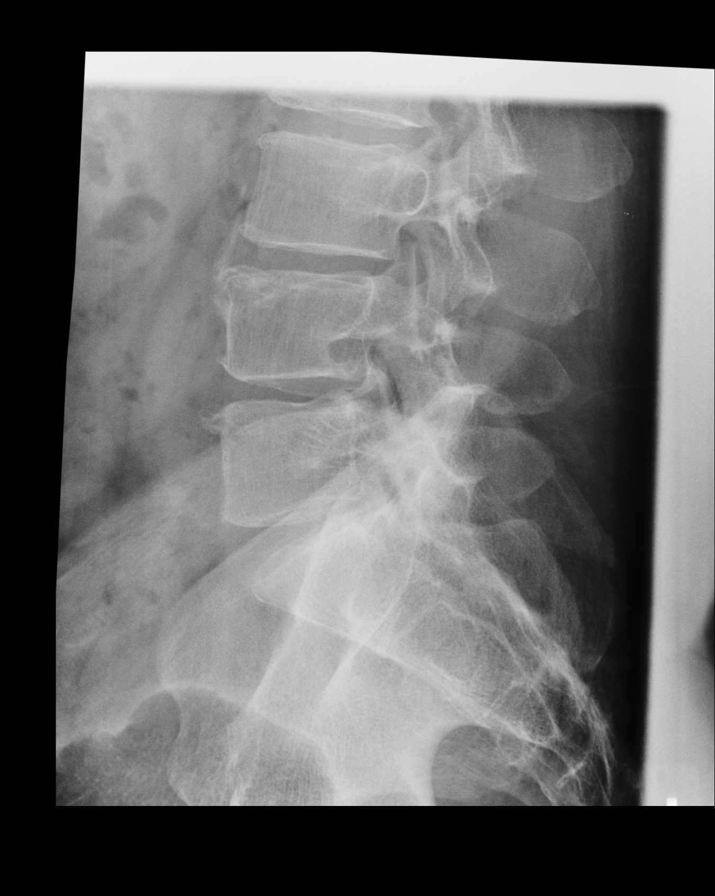
[im 6/9]
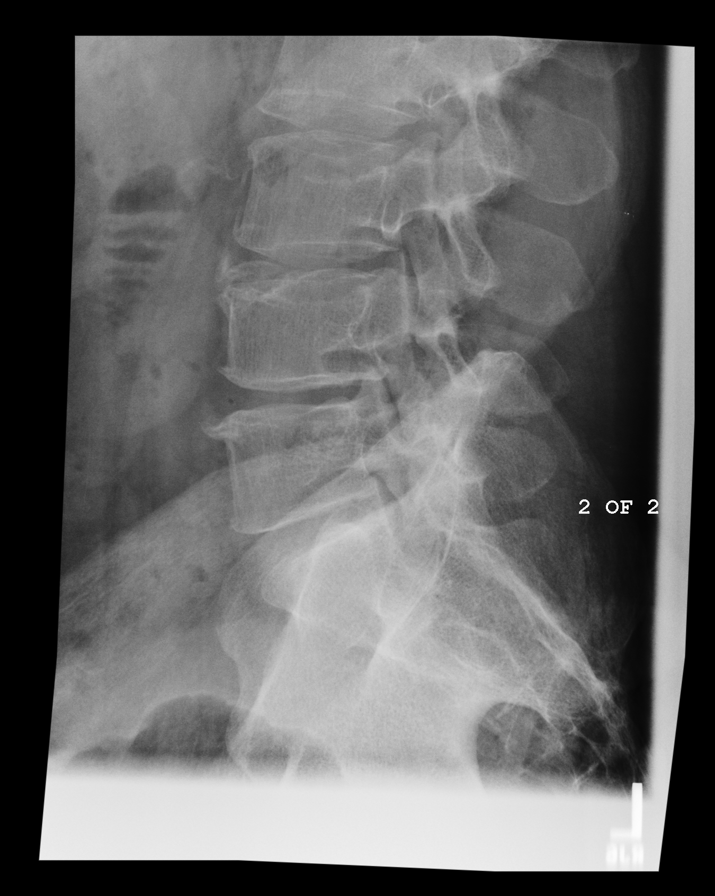
[im 7/9]
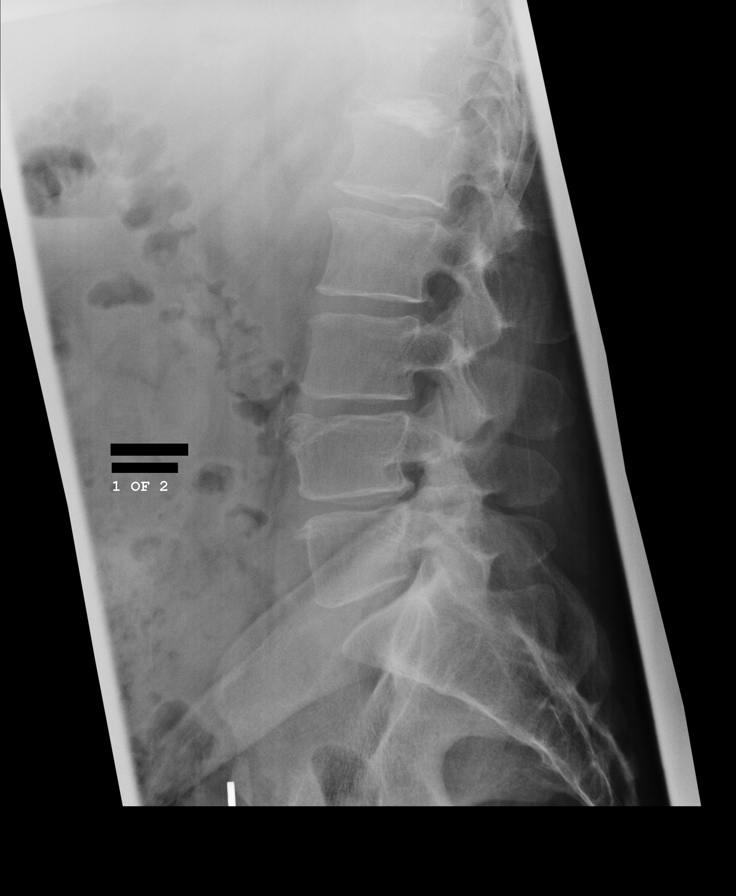
[im 8/9]
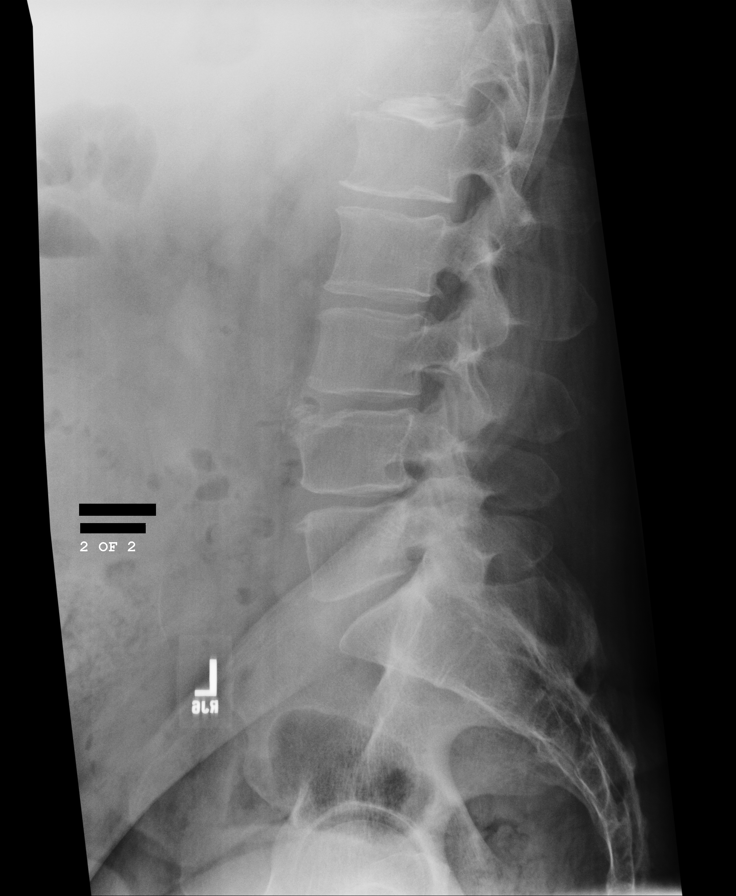

[8 of 9 positions shown; findings below may reference images not displayed]

PROCEDURE:     DXR - DXR LSP COMPLETE W/ FLEX/EXTEN  - February 08, 2011  [DATE]

RESULT:     The vertebral body heights are well maintained. There is
irregularity of the anterior-superior margin of the L4 lumbar vertebral body
compatible with an ununited epiphyseal ring. There is calcification at the
T12-L1 disc space consistent with degenerative change. No definite disc
space narrowing is seen. Oblique views show no significant abnormalities of
the articular facets. The pedicles are bilaterally intact. In the lateral
view, there is noted a nonspecific, 7 mm radiolucency at the posterior
inferior aspect of L4. The etiology for this is to me uncertain but the
finding can be seen on the topogram for prior CT's dating back to 6113 with
no appreciable interval change. The finding; therefore, is consistent with a
stable, benign process. Flexion and extension lateral views were performed
and show no abnormal vertebral body subluxation or abnormal vertebral body
opening or closing.
IMPRESSION: 1.  No fracture is seen.
2.  No definite disc space narrowing is identified.
3.  There is calcification in the T12-L1 intervertebral disc space
compatible with degenerative calcification.
4.  There is irregularity of the anterior margin of L4 compatible with an
ununited epiphyseal ring.
5.  There is a stable appearing lucent area in the posterior inferior aspect
of L4 that has not changed appreciably in appearance as compared to the
topogram for prior abdominal CT's dating back to 6113.
6.  Flexion and extension views show no abnormal vertebral body subluxation.

## 2012-08-01 IMAGING — CR DG KNEE COMPLETE 4+V*L*
1 series · 4 of 4 positions shown · non-contrast
Comparison: none

REASON FOR EXAM: pain arthritis
COMMENTS:

[Series 1: view not recorded · 0.17mm/px · 4 of 4 slices shown]
[im 1/4]
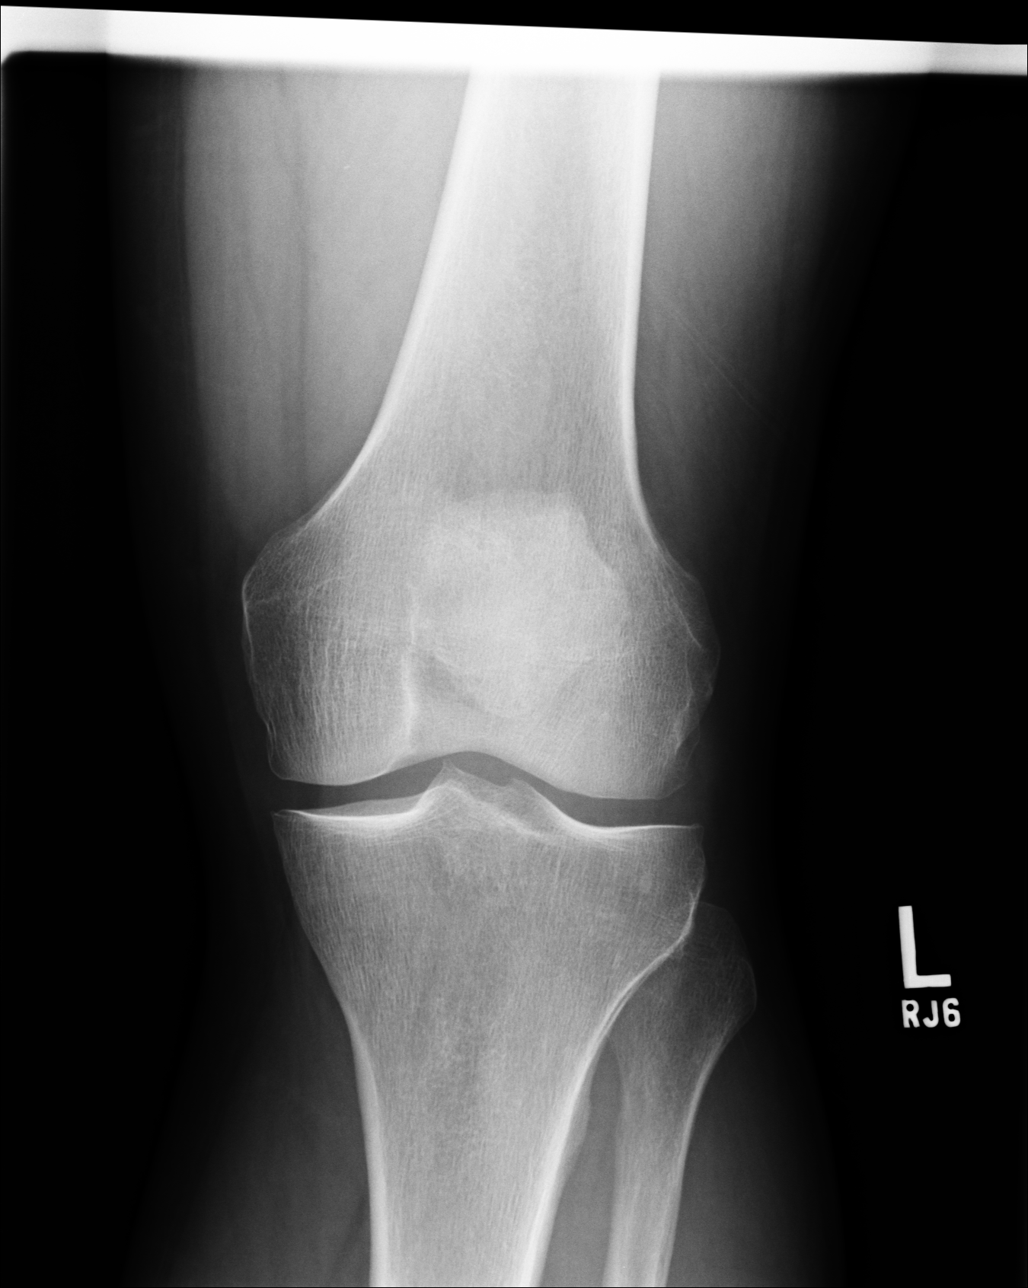
[im 2/4]
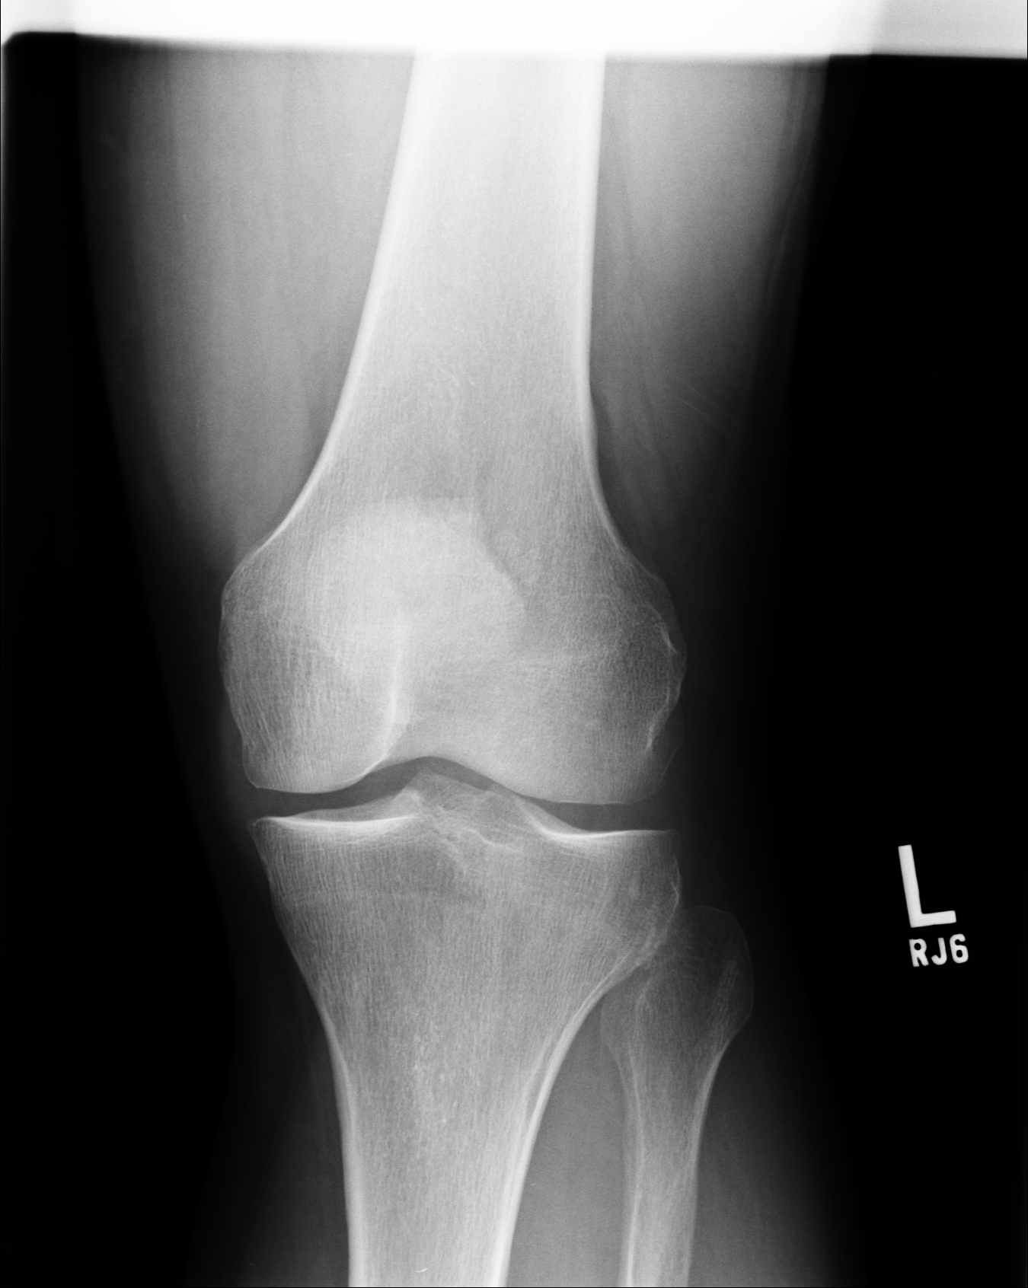
[im 3/4]
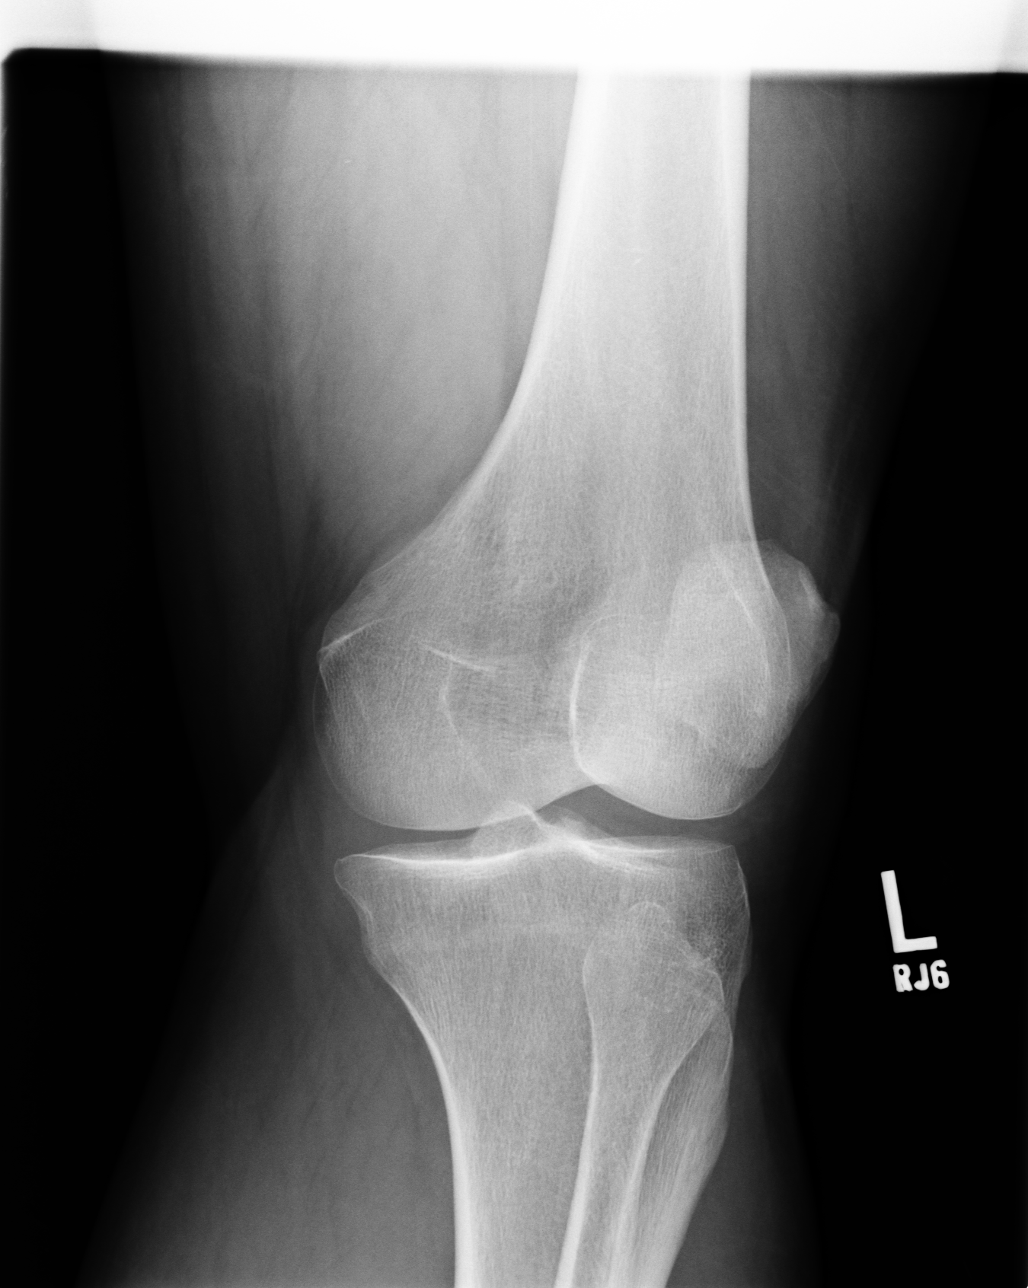
[im 4/4]
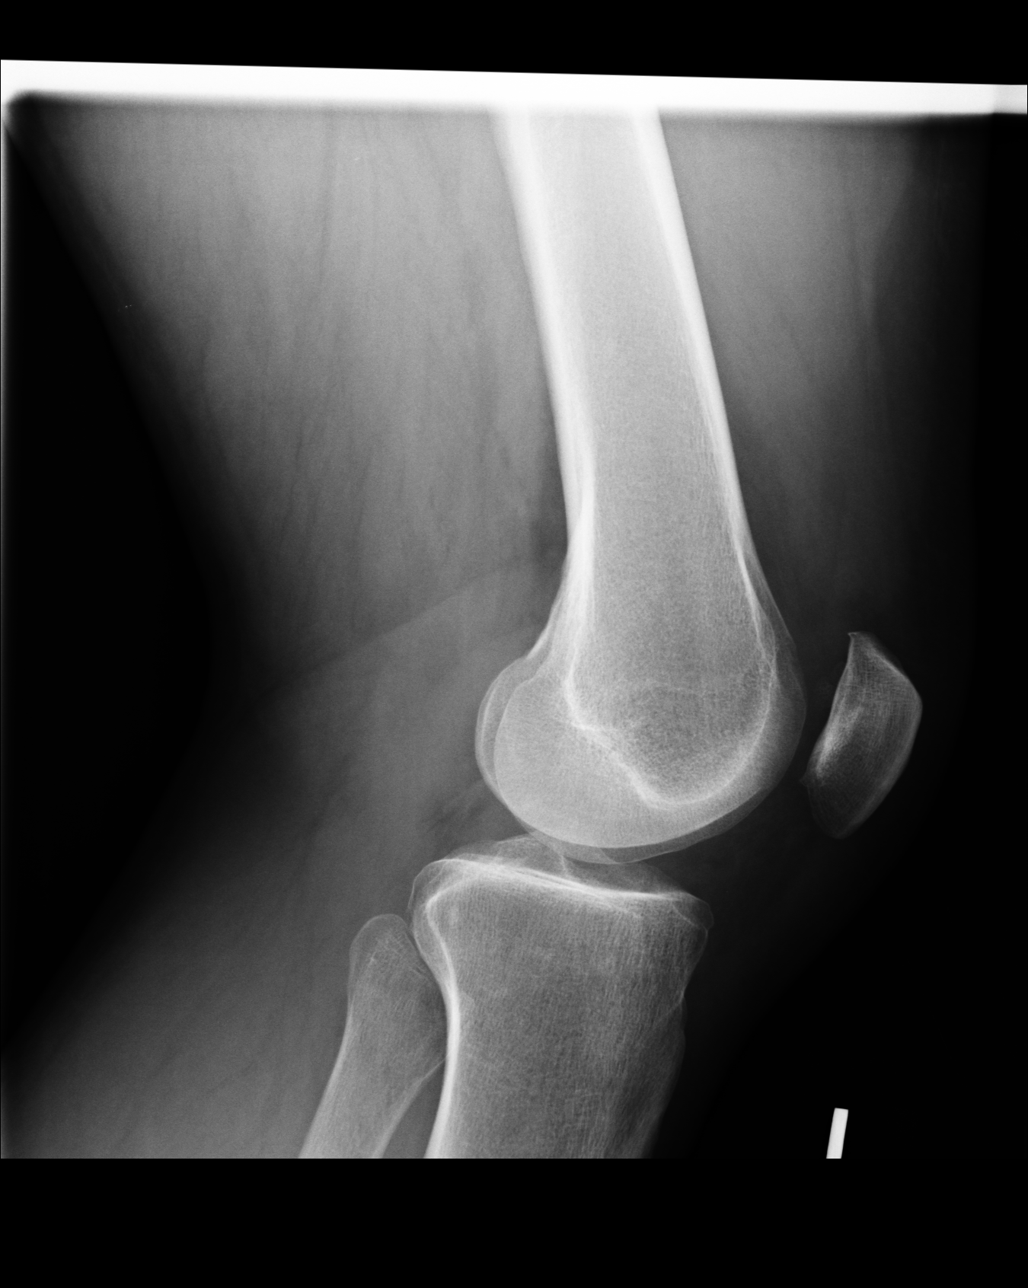

[4 of 4 positions shown; findings below may reference images not displayed]

PROCEDURE:     DXR - DXR KNEE LT COMP WITH OBLIQUES  - February 08, 2011  [DATE]

RESULT:     No fracture, dislocation or other acute bony abnormality is
identified. The knee joint space is well maintained. There is very slight,
barely perceptible spur formation at the knee medially and laterally. The
patella is intact. There is slight dorsal patellar spurring. No lytic or
blastic changes about the knee are seen.
IMPRESSION: 1.  No acute bony abnormalities are identified.
2.  There is very slight spur formation at the knee and at the
femoropatellar joint.
3.  No narrowing of the knee joint space is observed.

## 2012-08-12 IMAGING — CR RIGHT FOOT COMPLETE - 3+ VIEW
1 series · 3 of 3 positions shown · non-contrast
Comparison: none

REASON FOR EXAM: PAINFUL POST FALL AND ABRASION
COMMENTS:

PROCEDURE:     DXR - DXR FOOT RT COMPLETE W/OBLIQUES  - February 19, 2011  [DATE]
RESULT:     There is no evidence of fracture, dislocation, or malalignment.

[Series 1: view not recorded · 0.17mm/px · 3 of 3 slices shown]
[im 1/3]
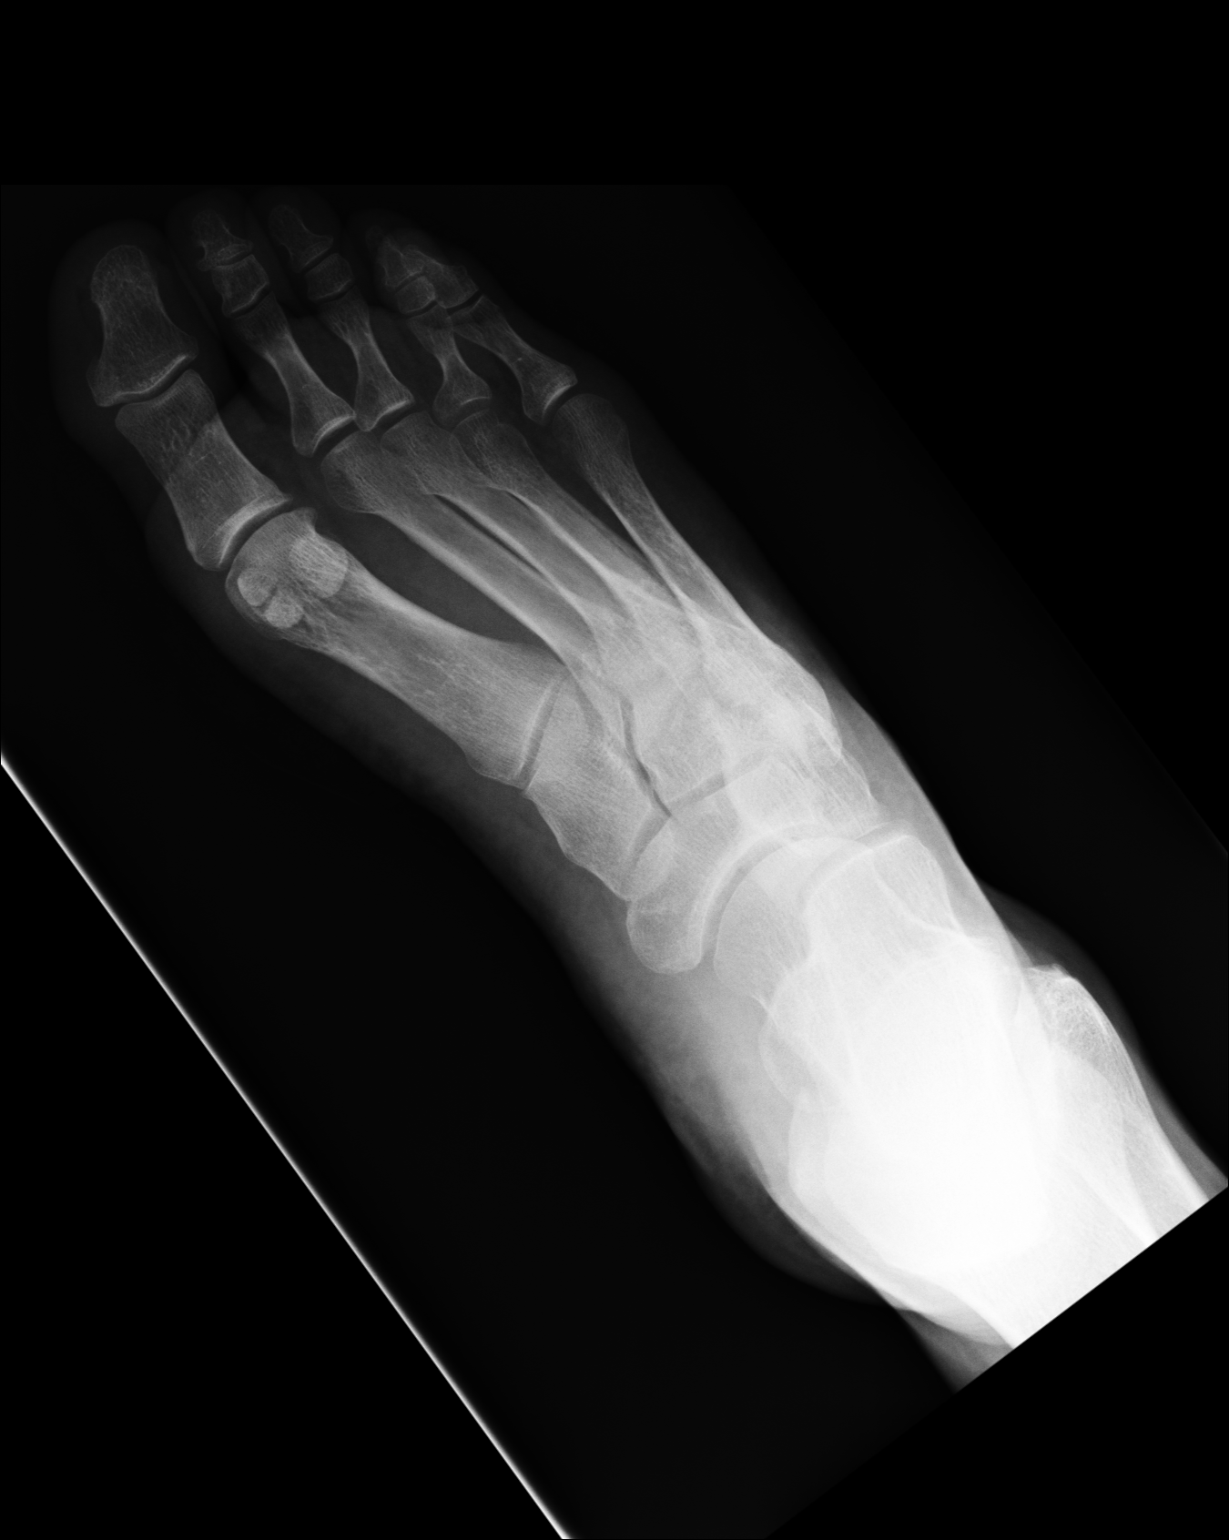
[im 2/3]
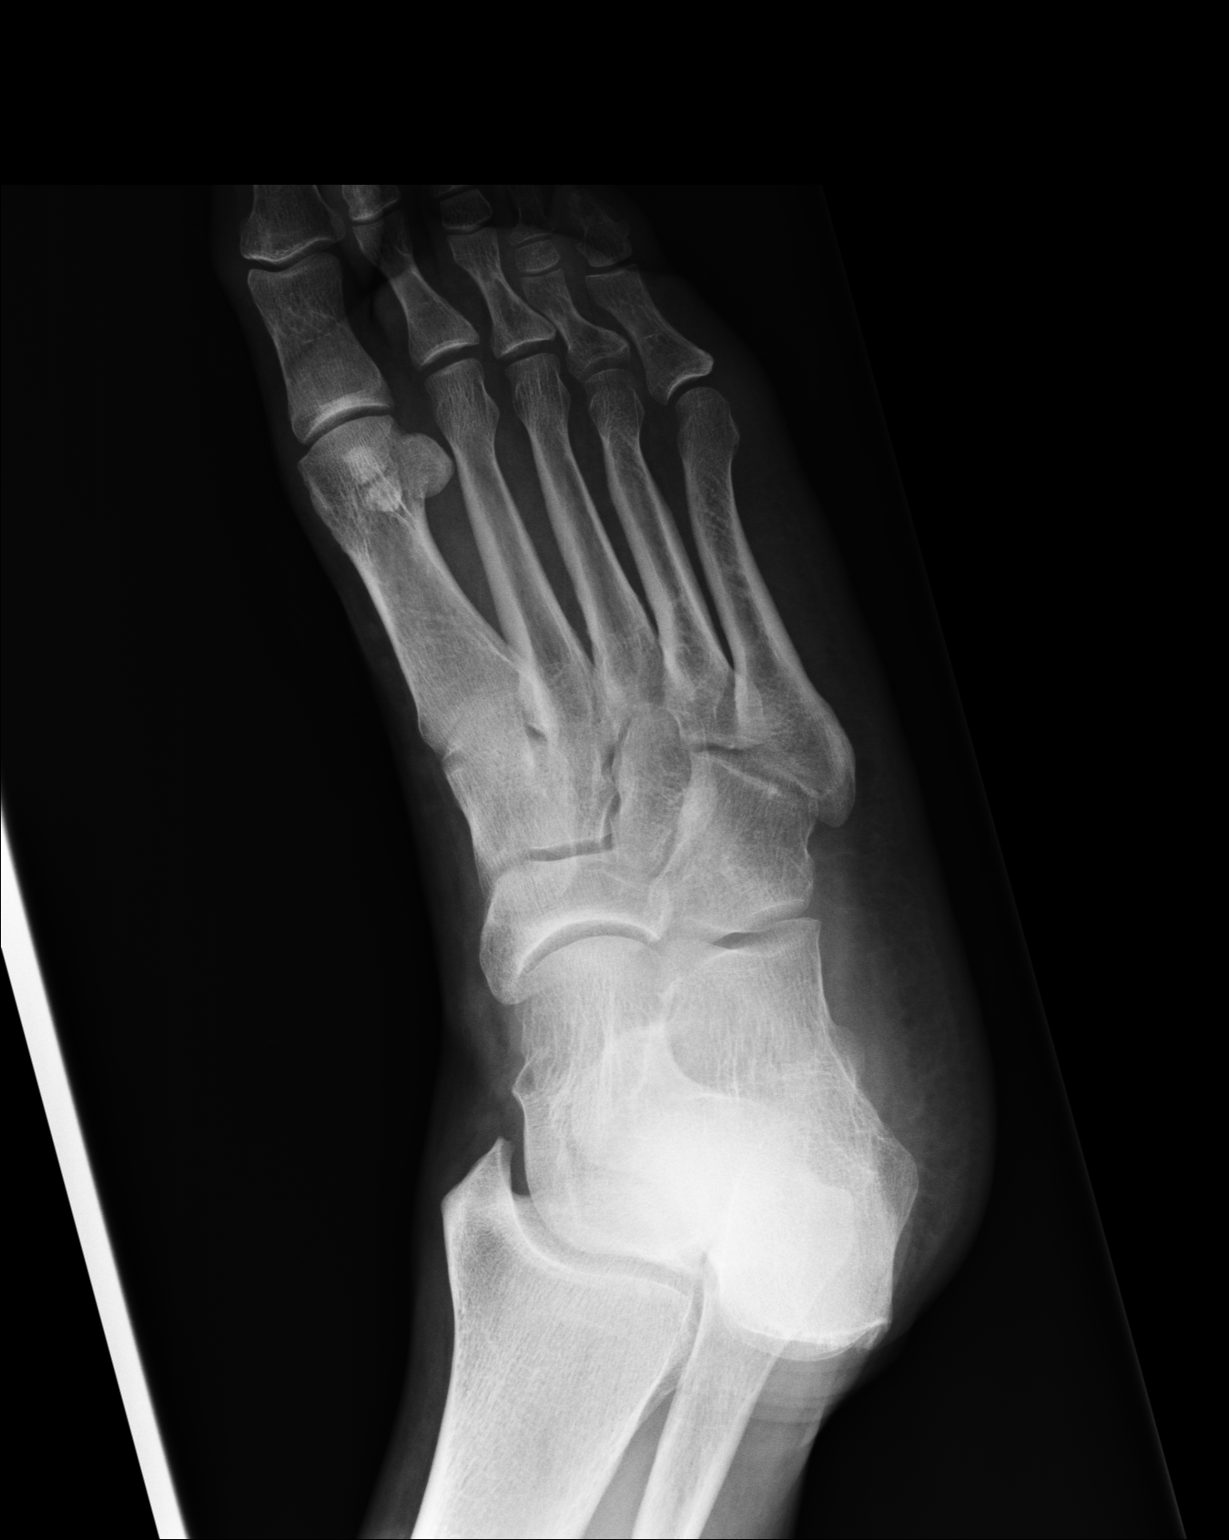
[im 3/3]
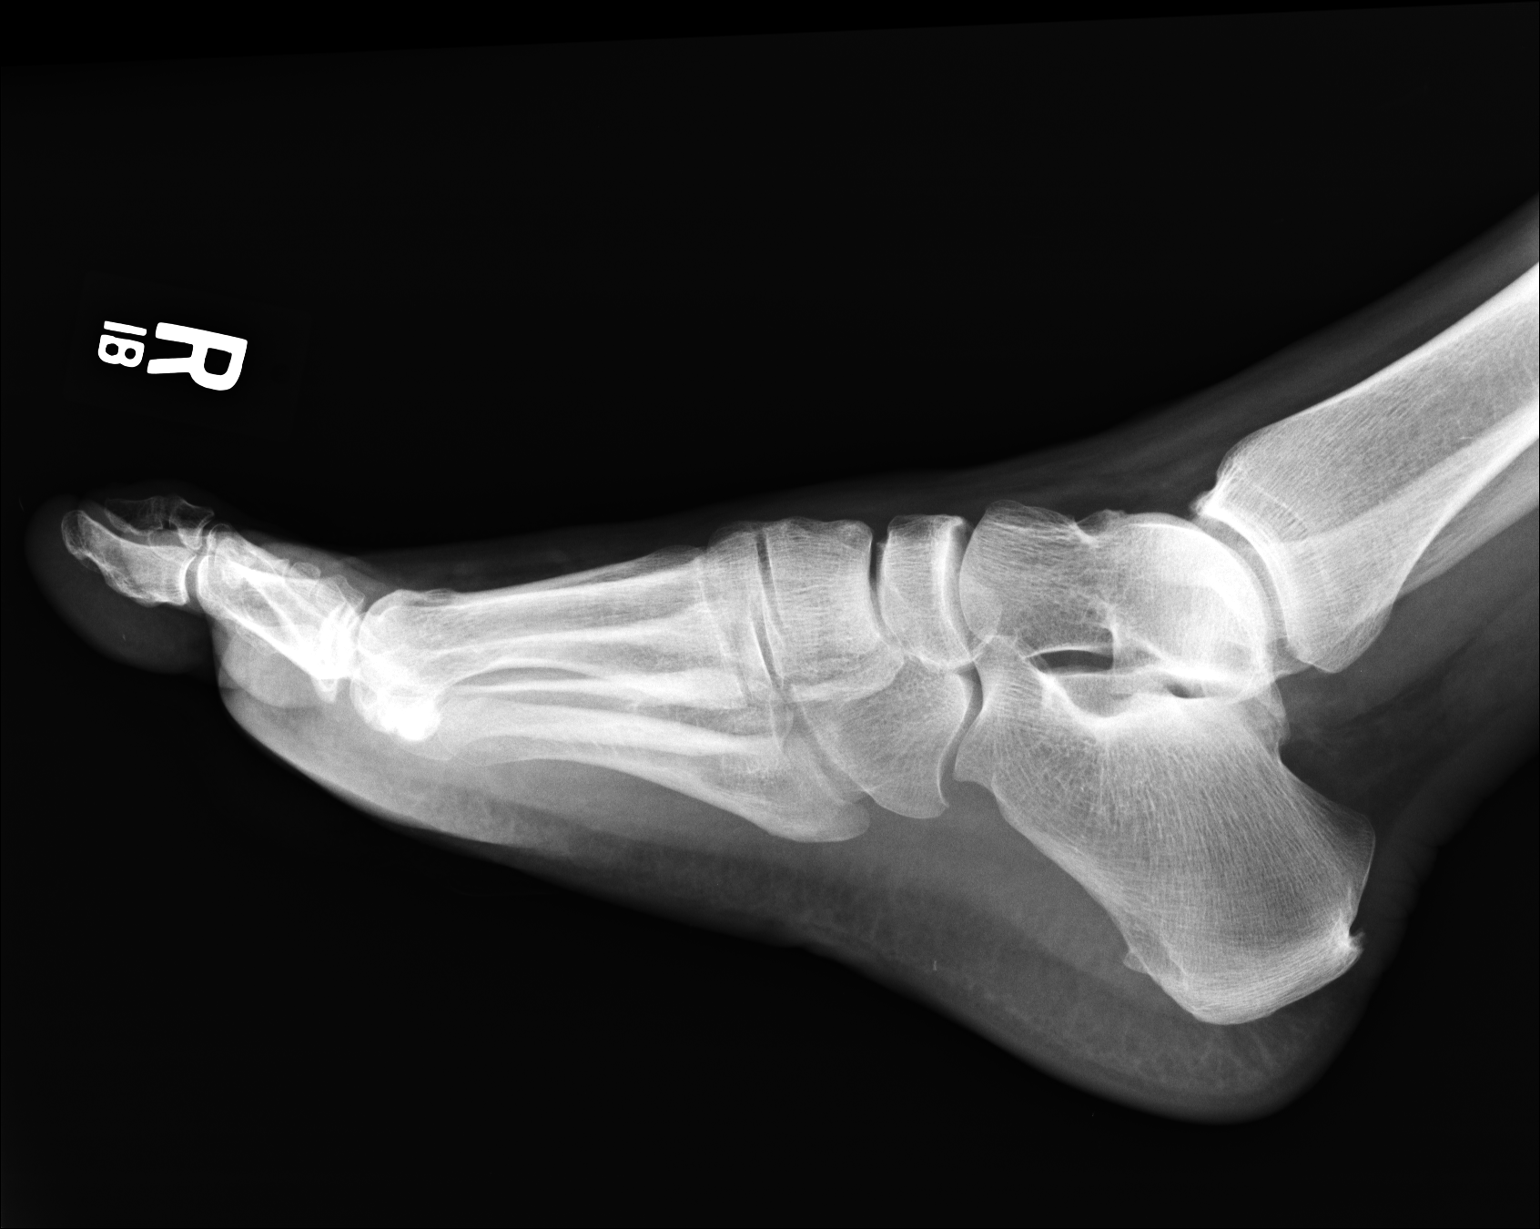

[3 of 3 positions shown; findings below may reference images not displayed]

IMPRESSION: 1. No evidence of acute abnormalities.
2. If there are persistent complaints of pain or persistent clinical
concern, a repeat evaluation in 7-10 days is recommended if clinically
warranted.

## 2012-08-12 IMAGING — CR RIGHT ANKLE - COMPLETE 3+ VIEW
1 series · 5 of 5 positions shown · non-contrast
Comparison: none

REASON FOR EXAM: slipped on stairs
COMMENTS:

PROCEDURE:     DXR - DXR ANKLE RIGHT COMPLETE  - February 19, 2011  [DATE]
RESULT:     There is no evidence of fracture, dislocation, or malalignment.

[Series 1: view not recorded · 0.17mm/px · 5 of 5 slices shown]
[im 1/5]
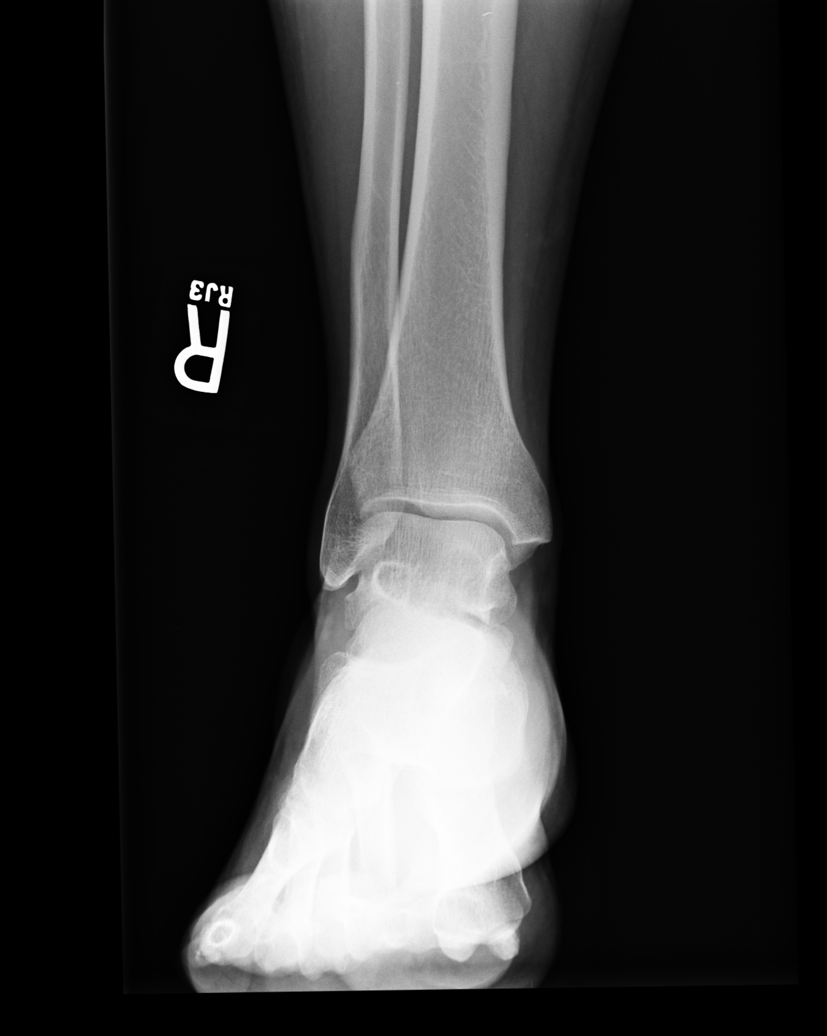
[im 2/5]
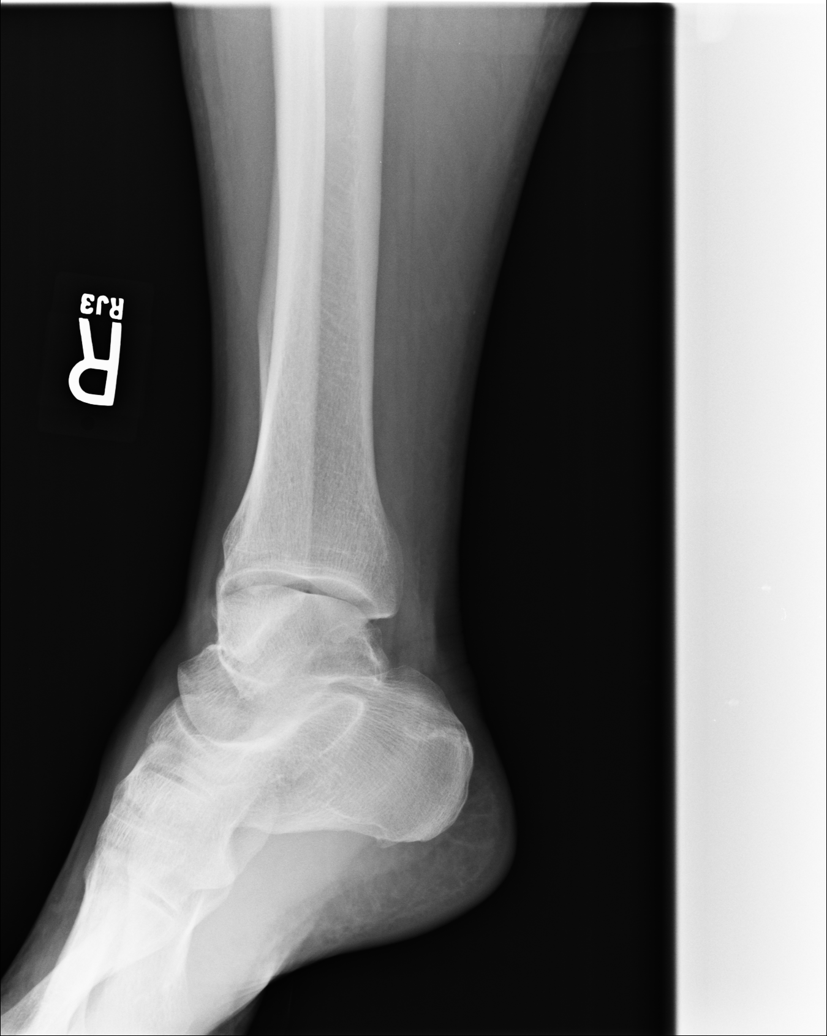
[im 3/5]
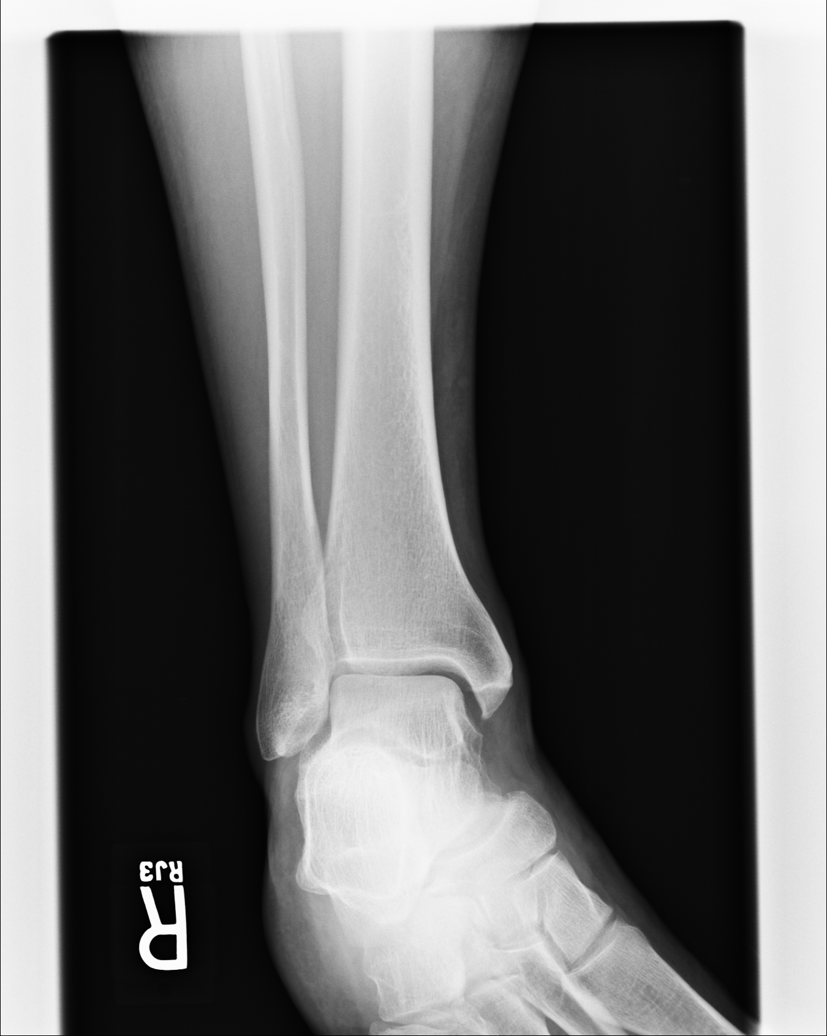
[im 4/5]
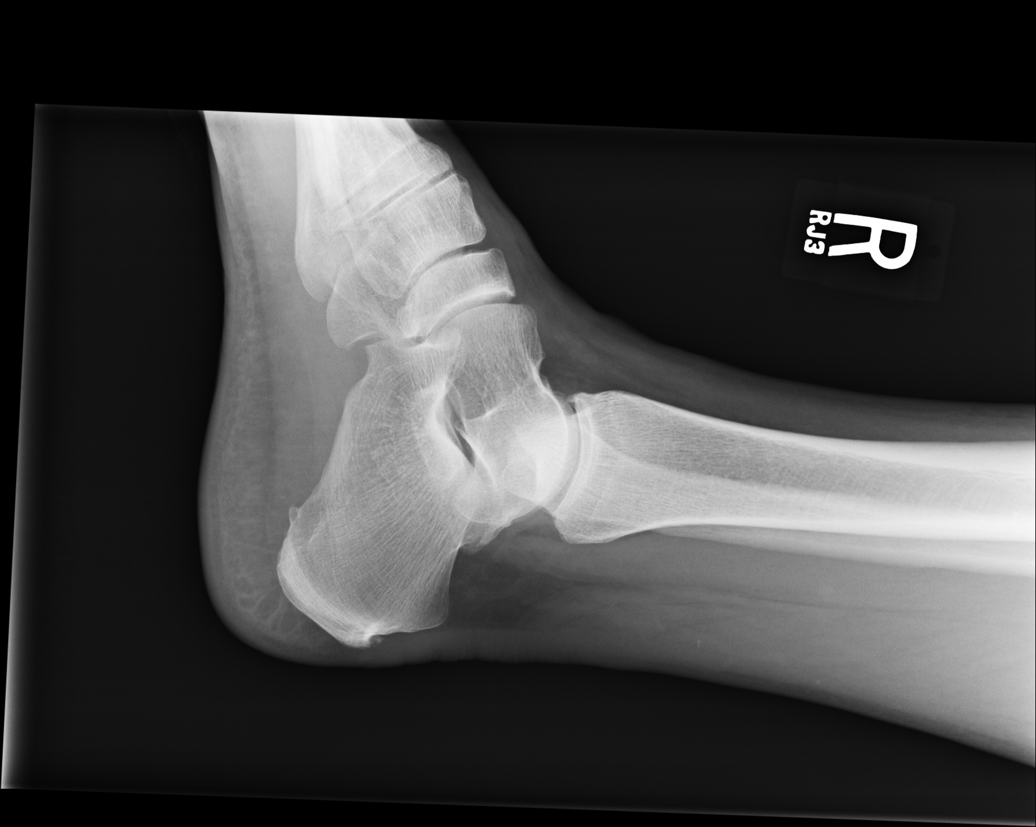
[im 5/5]
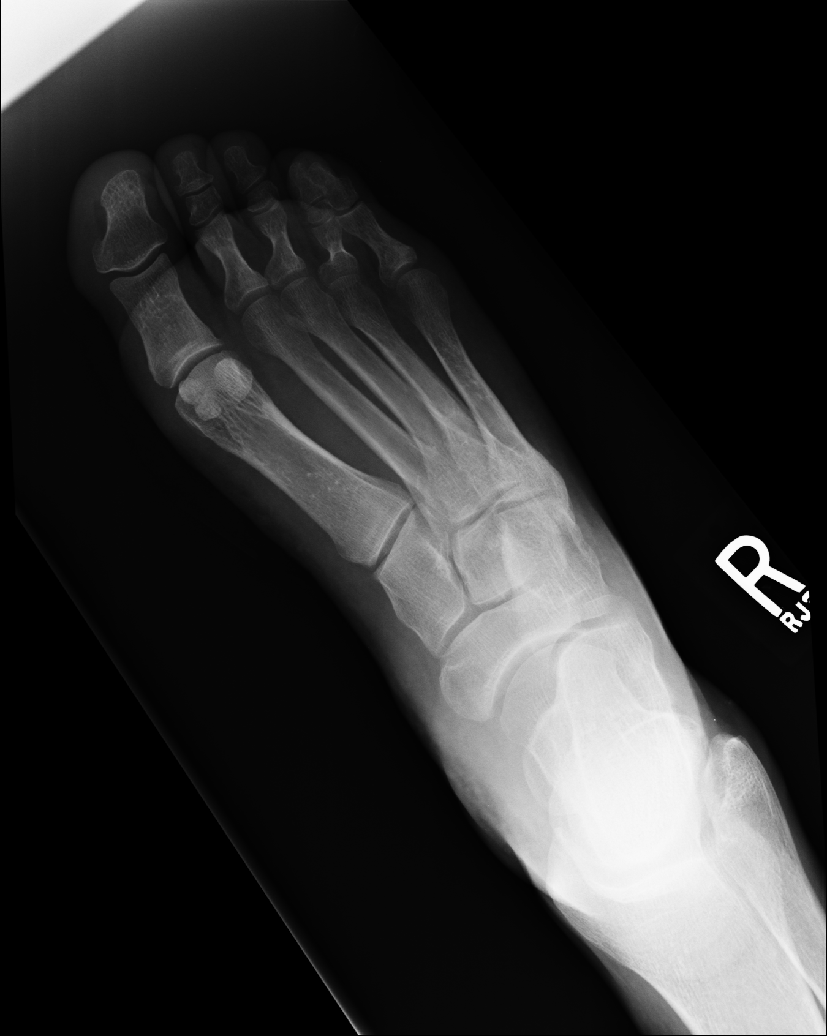

[5 of 5 positions shown; findings below may reference images not displayed]

IMPRESSION: 1. No evidence of acute abnormalities.
2. If there are persistent complaints of pain or persistent clinical
concern, a repeat evaluation in 7-10 days is recommended if clinically
warranted.

## 2012-08-26 ENCOUNTER — Emergency Department: Payer: Self-pay | Admitting: Emergency Medicine

## 2012-08-26 LAB — CBC WITH DIFFERENTIAL/PLATELET
Basophil #: 0.1 10*3/uL (ref 0.0–0.1)
Basophil %: 0.7 %
Eosinophil #: 0.1 10*3/uL (ref 0.0–0.7)
Eosinophil %: 1 %
HCT: 46.6 % (ref 40.0–52.0)
HGB: 15.9 g/dL (ref 13.0–18.0)
Lymphocyte #: 2.3 10*3/uL (ref 1.0–3.6)
Lymphocyte %: 29.3 %
MCH: 27.8 pg (ref 26.0–34.0)
MCHC: 34.1 g/dL (ref 32.0–36.0)
MCV: 82 fL (ref 80–100)
Monocyte #: 0.9 x10 3/mm (ref 0.2–1.0)
Monocyte %: 11.3 %
Neutrophil #: 4.5 10*3/uL (ref 1.4–6.5)
Neutrophil %: 57.7 %
Platelet: 190 10*3/uL (ref 150–440)
RBC: 5.71 10*6/uL (ref 4.40–5.90)
RDW: 13.6 % (ref 11.5–14.5)
WBC: 7.7 10*3/uL (ref 3.8–10.6)

## 2012-08-26 LAB — COMPREHENSIVE METABOLIC PANEL
Albumin: 3.5 g/dL (ref 3.4–5.0)
Alkaline Phosphatase: 81 U/L (ref 50–136)
Anion Gap: 8 (ref 7–16)
BUN: 10 mg/dL (ref 7–18)
Bilirubin,Total: 1.2 mg/dL — ABNORMAL HIGH (ref 0.2–1.0)
Calcium, Total: 8.1 mg/dL — ABNORMAL LOW (ref 8.5–10.1)
Chloride: 108 mmol/L — ABNORMAL HIGH (ref 98–107)
Co2: 24 mmol/L (ref 21–32)
Creatinine: 0.79 mg/dL (ref 0.60–1.30)
EGFR (African American): >60
EGFR (Non-African Amer.): >60
Glucose: 109 mg/dL — ABNORMAL HIGH (ref 65–99)
Osmolality: 279 (ref 275–301)
Potassium: 3.8 mmol/L (ref 3.5–5.1)
SGOT(AST): 30 U/L (ref 15–37)
SGPT (ALT): 49 U/L (ref 12–78)
Sodium: 140 mmol/L (ref 136–145)
Total Protein: 7 g/dL (ref 6.4–8.2)

## 2012-08-26 LAB — URINALYSIS, COMPLETE
Bacteria: NONE SEEN
Bilirubin,UR: NEGATIVE
Blood: NEGATIVE
Glucose,UR: NEGATIVE mg/dL (ref 0–75)
Ketone: NEGATIVE
Leukocyte Esterase: NEGATIVE
Nitrite: NEGATIVE
Ph: 5 (ref 4.5–8.0)
Protein: 100
RBC,UR: 1 /HPF (ref 0–5)
Specific Gravity: 1.023 (ref 1.003–1.030)
Squamous Epithelial: NONE SEEN
WBC UR: 1 /HPF (ref 0–5)

## 2012-08-26 LAB — RAPID INFLUENZA A&B ANTIGENS

## 2012-08-26 LAB — TROPONIN I: Troponin-I: <0.02 ng/mL

## 2012-08-26 LAB — CK TOTAL AND CKMB (NOT AT ARMC)
CK, Total: 53 U/L (ref 35–232)
CK-MB: <0.5 ng/mL — ABNORMAL LOW (ref 0.5–3.6)

## 2012-11-06 IMAGING — US ABDOMEN ULTRASOUND LIMITED
1 series · 17 of 25 positions shown · non-contrast
Comparison: none

REASON FOR EXAM: abd LFTs
COMMENTS:

[Series 1: abdomen ultrasound limited · 17 of 80 slices shown]
[im 1/80]
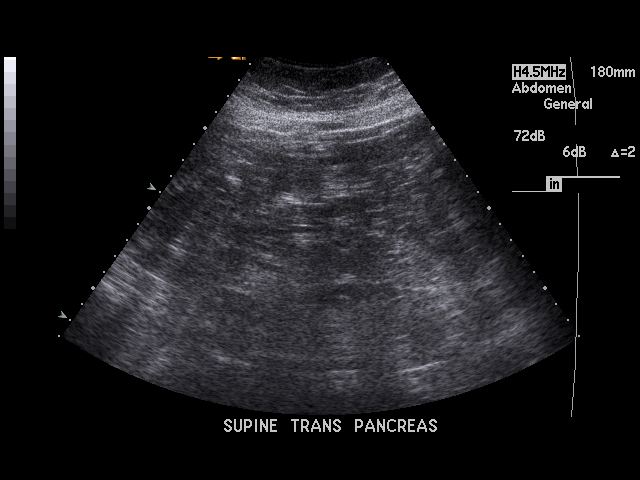
[im 7/80]
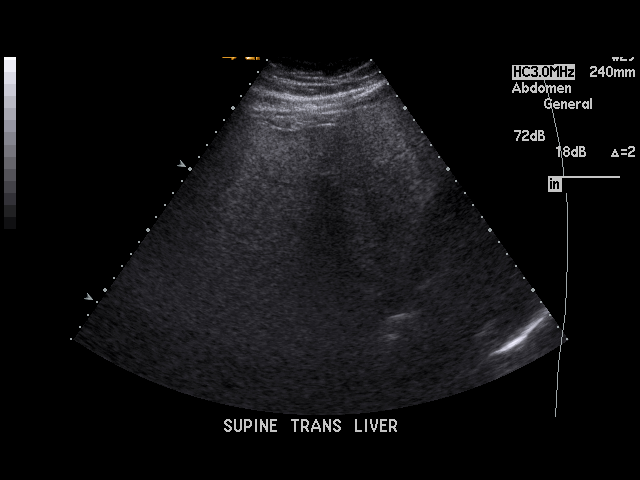
[im 10/80]
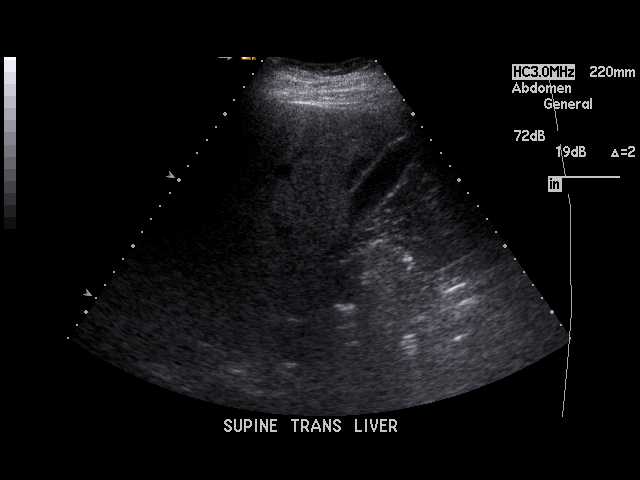
[im 17/80]
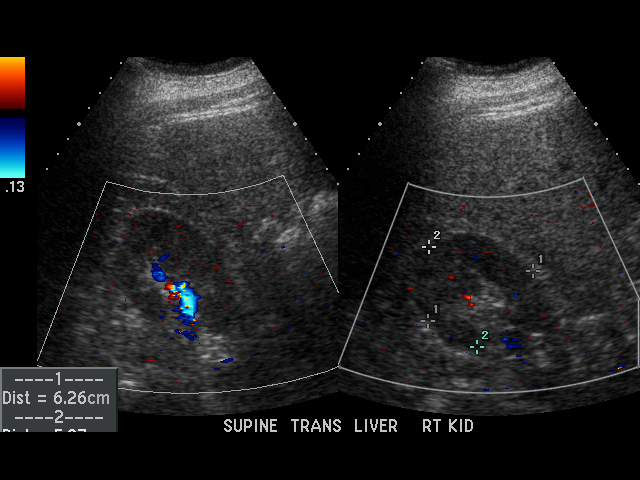
[im 20/80]
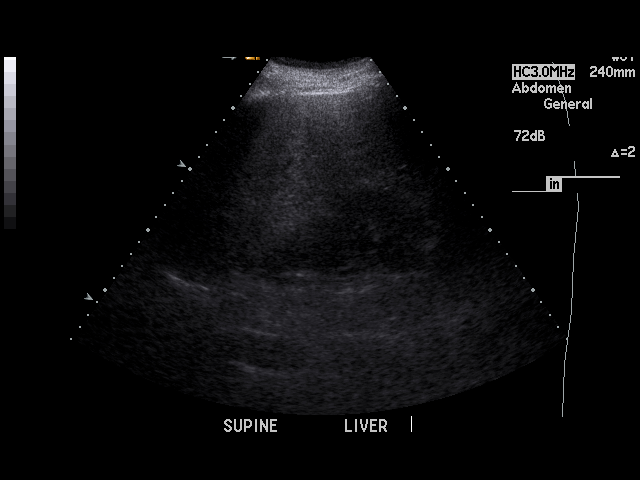
[im 27/80]
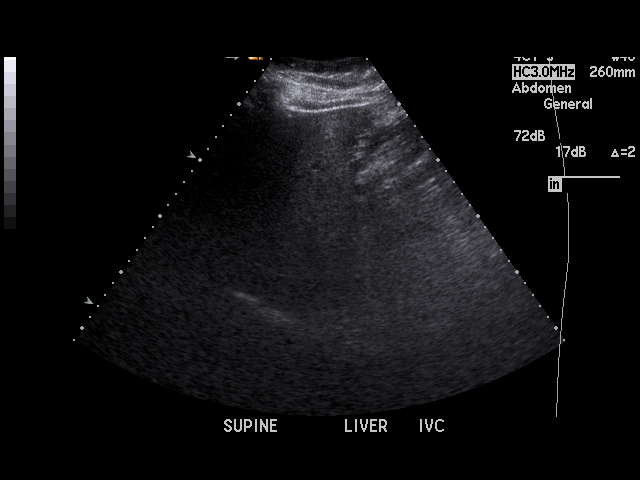
[im 30/80]
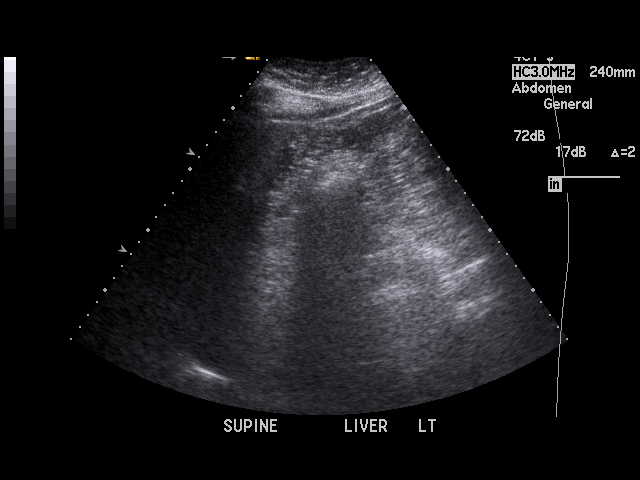
[im 37/80]
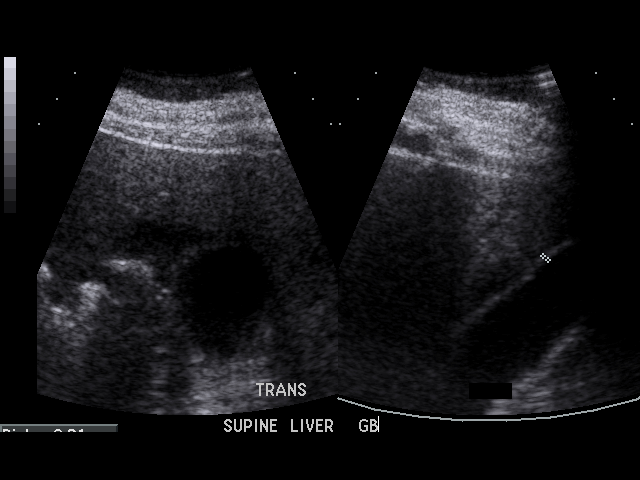
[im 40/80]
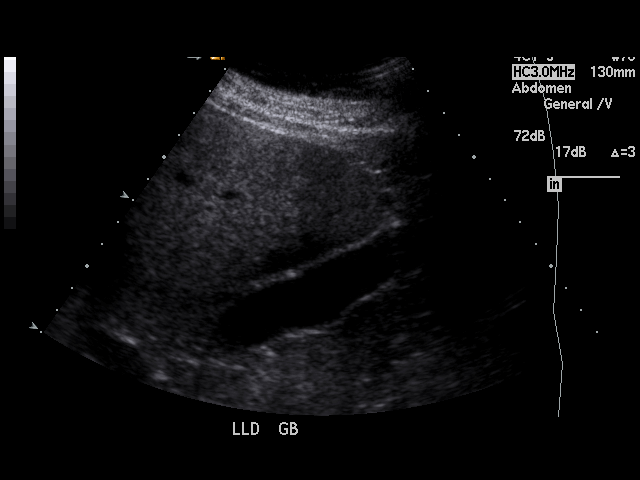
[im 43/80]
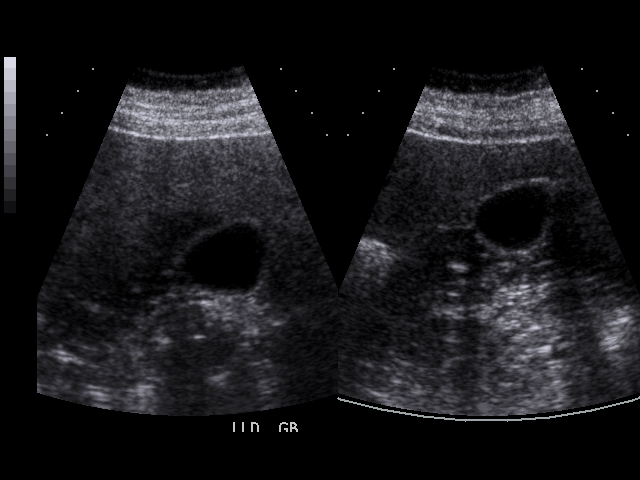
[im 50/80]
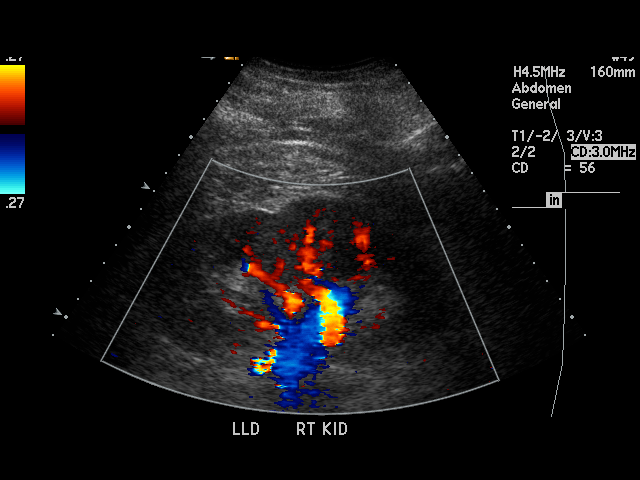
[im 53/80]
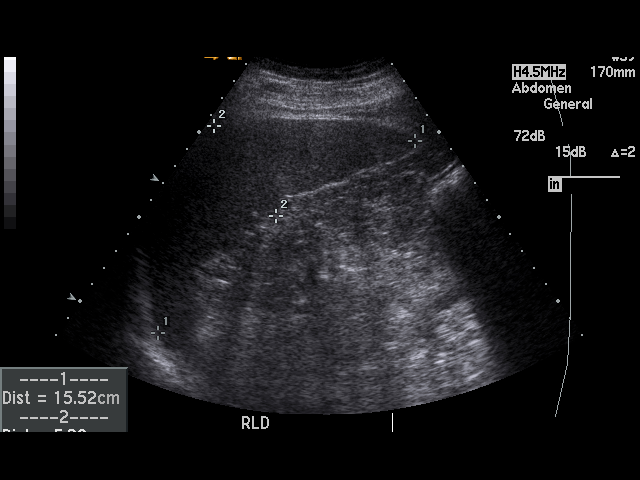
[im 60/80]
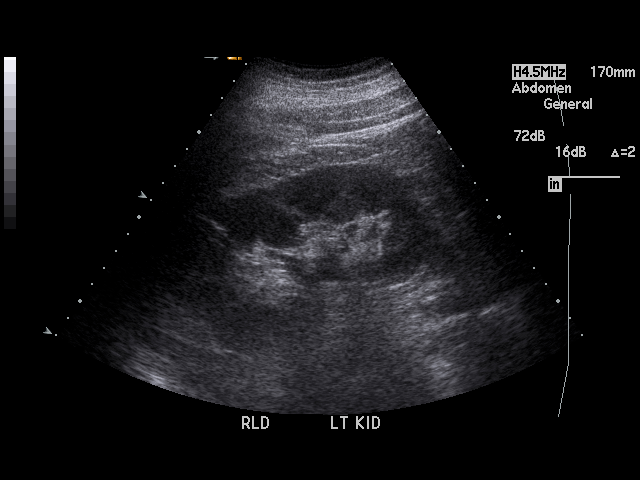
[im 63/80]
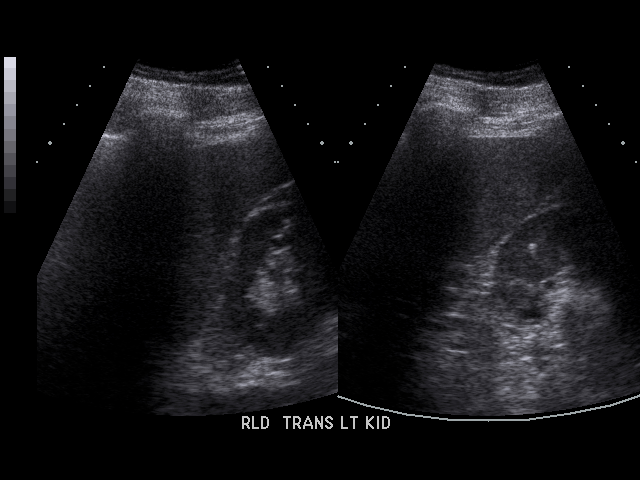
[im 70/80]
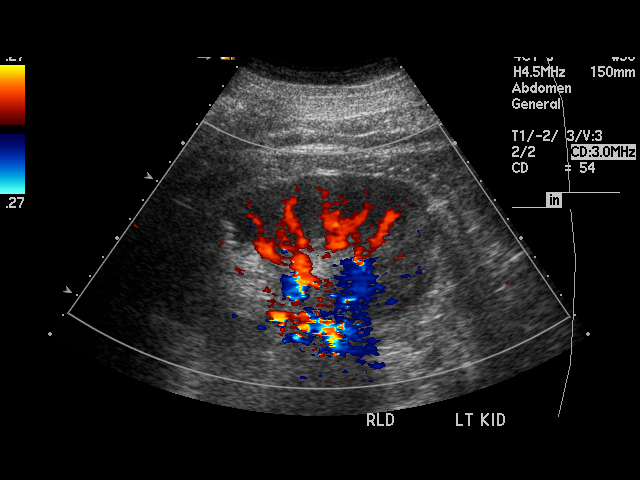
[im 73/80]
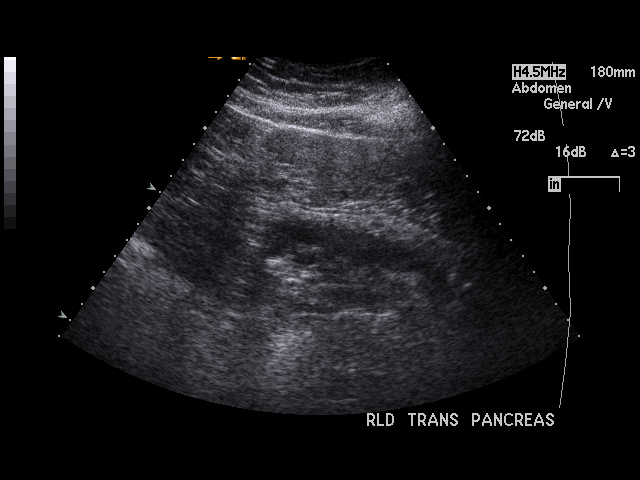
[im 80/80]
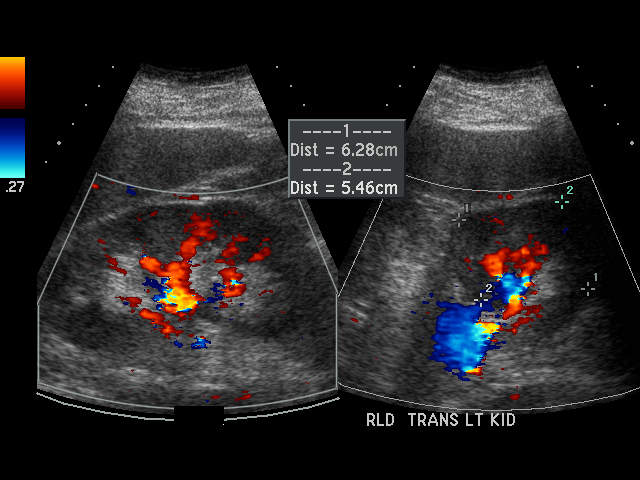

[17 of 25 positions shown; findings below may reference images not displayed]

PROCEDURE:     ENDRII - ENDRII ABDOMEN UPPER GENERAL  - May 16, 2011  [DATE]

RESULT:     The liver is hyperechogenic compatible with fatty infiltration.
Focal fatty sparing is noted medially in the right lobe. No focal hepatic
mass lesions are identified. The pancreas is obscured by bowel gas. The
spleen is mildly enlarged and is noted to measure 15.52 cm at maximum AP
diameter. The abdominal aorta and inferior vena cava show no significant
abnormalities. No gallstones are seen. There is no thickening of the
gallbladder wall. Common bile duct measures 5.1 mm in diameter which is
within normal limits. The kidneys show no hydronephrosis. Sagittally, the
right kidney measures 13.27 cm and the left measures 12.94 cm. No ascites is
seen.
IMPRESSION: 1. Probable fatty infiltration of the liver.
2. There is mild splenomegaly.
3. No gallstones are seen.
4. The pancreas is obscured by bowel gas.

## 2012-11-12 ENCOUNTER — Emergency Department: Payer: Self-pay | Admitting: Internal Medicine

## 2012-11-12 LAB — URINALYSIS, COMPLETE
Bacteria: NONE SEEN
Bilirubin,UR: NEGATIVE
Blood: NEGATIVE
Glucose,UR: NEGATIVE mg/dL (ref 0–75)
Ketone: NEGATIVE
Leukocyte Esterase: NEGATIVE
Nitrite: NEGATIVE
Ph: 5 (ref 4.5–8.0)
Protein: 100
RBC,UR: 1 /HPF (ref 0–5)
Specific Gravity: 1.019 (ref 1.003–1.030)
Squamous Epithelial: <1
WBC UR: 2 /HPF (ref 0–5)

## 2012-11-12 LAB — COMPREHENSIVE METABOLIC PANEL
Albumin: 4.2 g/dL (ref 3.4–5.0)
Alkaline Phosphatase: 83 U/L (ref 50–136)
Anion Gap: 10 (ref 7–16)
BUN: 12 mg/dL (ref 7–18)
Bilirubin,Total: 1.7 mg/dL — ABNORMAL HIGH (ref 0.2–1.0)
Calcium, Total: 9.4 mg/dL (ref 8.5–10.1)
Chloride: 104 mmol/L (ref 98–107)
Co2: 23 mmol/L (ref 21–32)
Creatinine: 0.69 mg/dL (ref 0.60–1.30)
EGFR (African American): >60
EGFR (Non-African Amer.): >60
Glucose: 111 mg/dL — ABNORMAL HIGH (ref 65–99)
Osmolality: 274 (ref 275–301)
Potassium: 3.6 mmol/L (ref 3.5–5.1)
SGOT(AST): 34 U/L (ref 15–37)
SGPT (ALT): 57 U/L (ref 12–78)
Sodium: 137 mmol/L (ref 136–145)
Total Protein: 8.3 g/dL — ABNORMAL HIGH (ref 6.4–8.2)

## 2012-11-12 LAB — CBC
HCT: 49.9 % (ref 40.0–52.0)
HGB: 17.5 g/dL (ref 13.0–18.0)
MCH: 29 pg (ref 26.0–34.0)
MCHC: 35.1 g/dL (ref 32.0–36.0)
MCV: 83 fL (ref 80–100)
Platelet: 247 10*3/uL (ref 150–440)
RBC: 6.04 10*6/uL — ABNORMAL HIGH (ref 4.40–5.90)
RDW: 13.9 % (ref 11.5–14.5)
WBC: 10.6 10*3/uL (ref 3.8–10.6)

## 2012-11-12 LAB — LIPASE, BLOOD: Lipase: 187 U/L (ref 73–393)

## 2012-11-16 ENCOUNTER — Ambulatory Visit: Payer: Self-pay | Admitting: Unknown Physician Specialty

## 2012-11-17 ENCOUNTER — Ambulatory Visit: Payer: Self-pay | Admitting: Surgery

## 2012-11-19 ENCOUNTER — Ambulatory Visit: Payer: Self-pay | Admitting: Surgery

## 2013-01-22 ENCOUNTER — Emergency Department: Payer: Self-pay | Admitting: Emergency Medicine

## 2013-02-16 IMAGING — CT CT PARANASAL SINUSES W/O CM
1 series · 16 of 30 positions shown, 20 images · non-contrast
Comparison: none

REASON FOR EXAM: severe headaches
COMMENTS:

[Series 2: axial facial 2.0 h70h · axial · 0.50mm/px · z∈[-236,-52]mm · 16 of 198 slices shown, 20 images]
[im 7/198  brain]
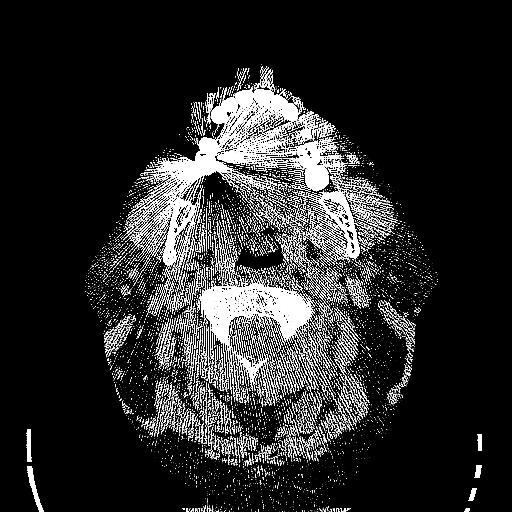
[im 7/198  bone]
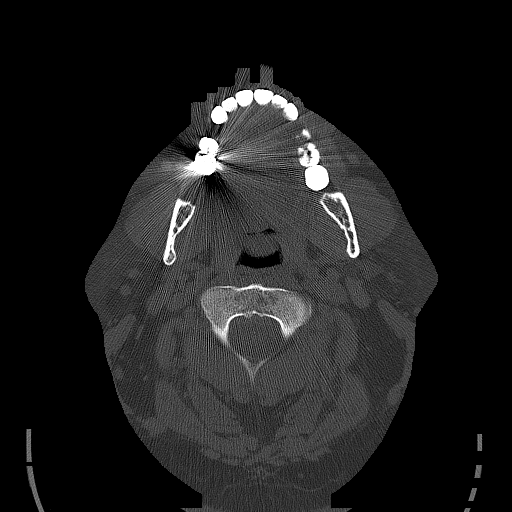
[im 21/198  bone]
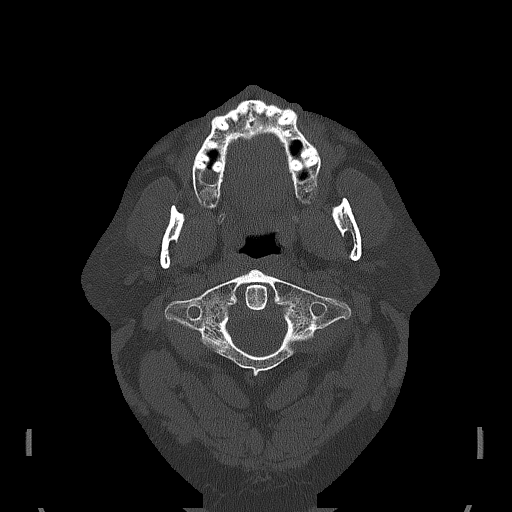
[im 34/198  bone]
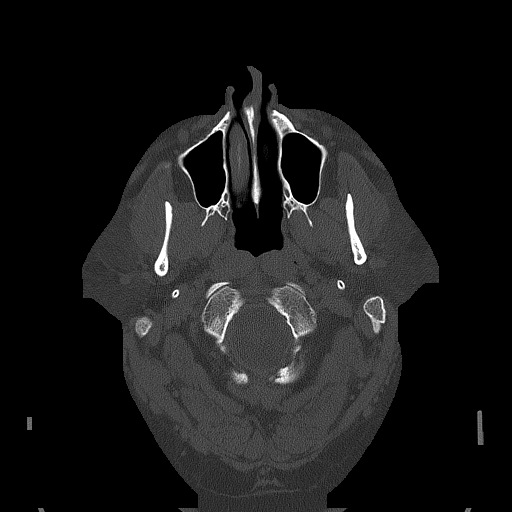
[im 48/198  bone]
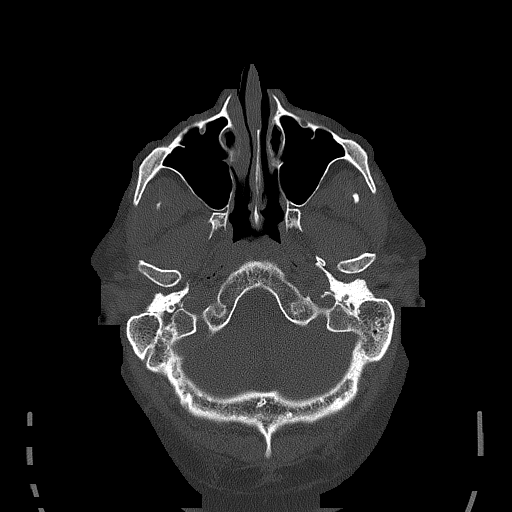
[im 55/198  brain]
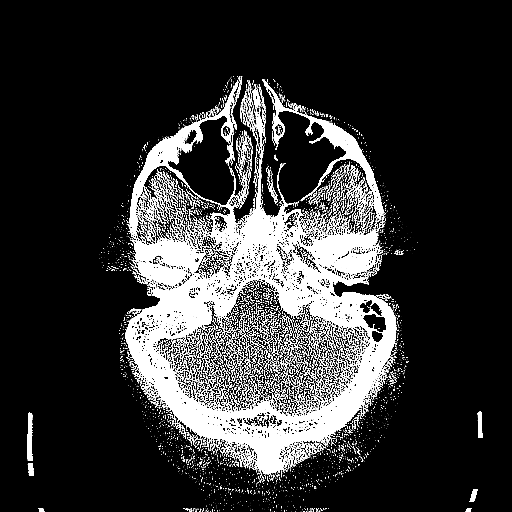
[im 55/198  bone]
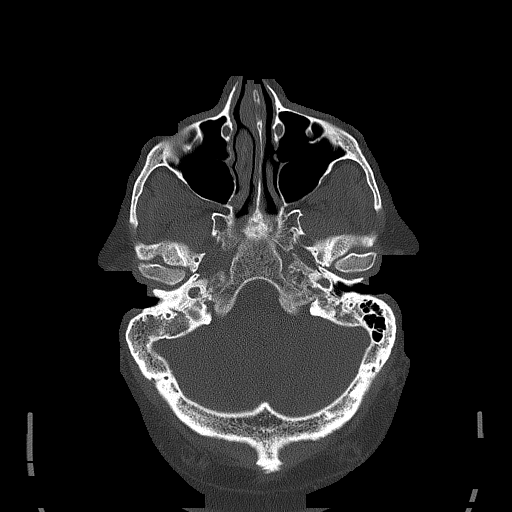
[im 68/198  bone]
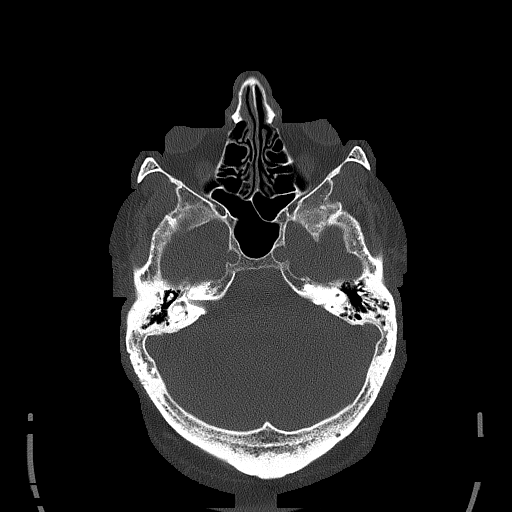
[im 82/198  bone]
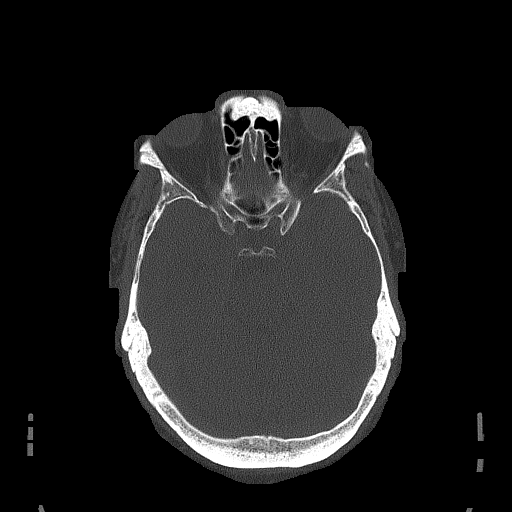
[im 96/198  bone]
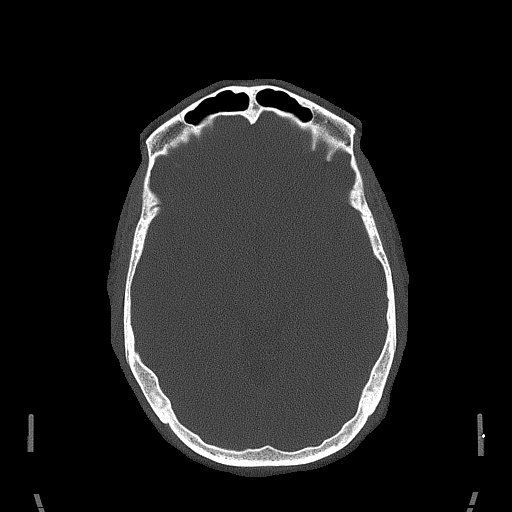
[im 102/198  brain]
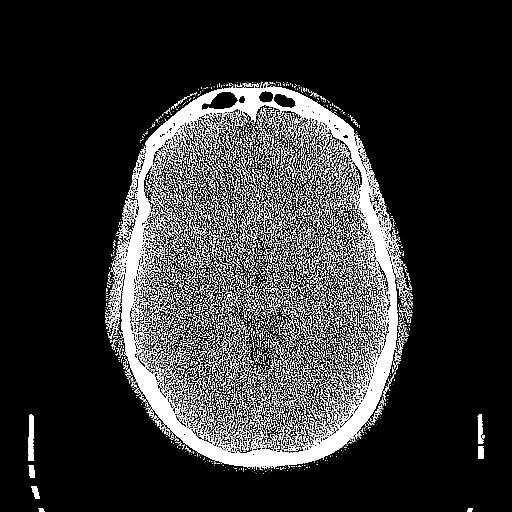
[im 102/198  bone]
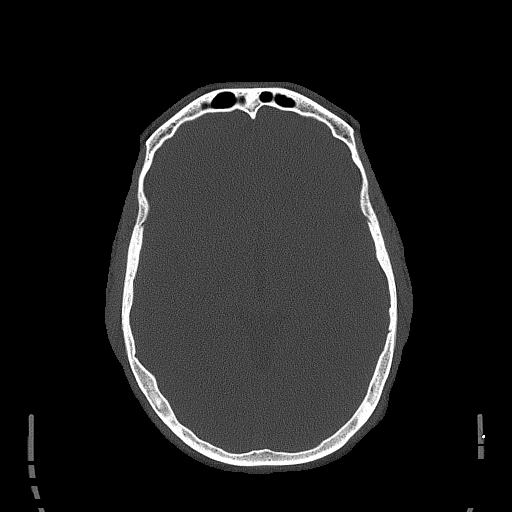
[im 116/198  bone]
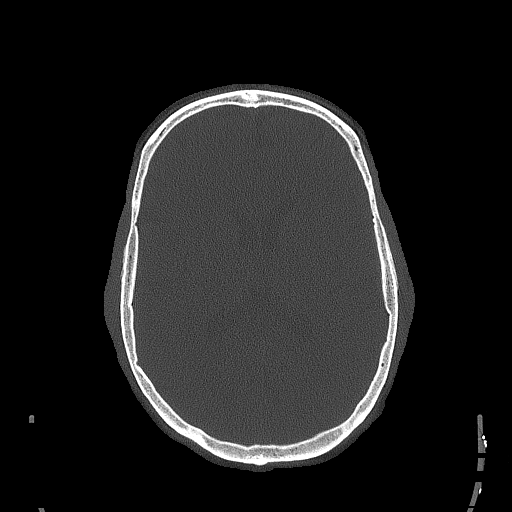
[im 130/198  bone]
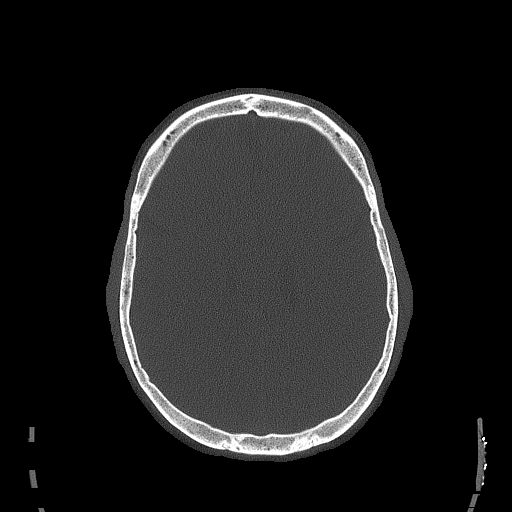
[im 143/198  bone]
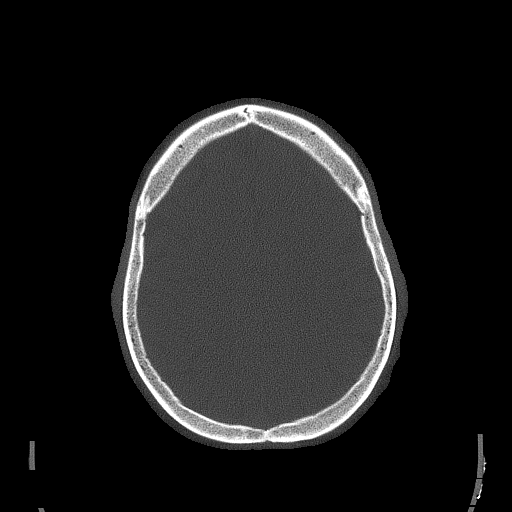
[im 150/198  brain]
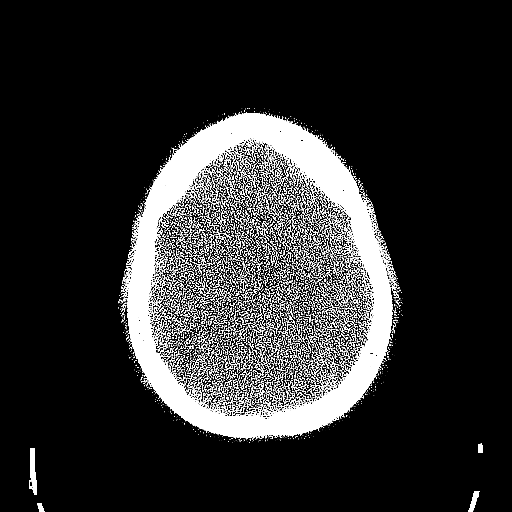
[im 150/198  bone]
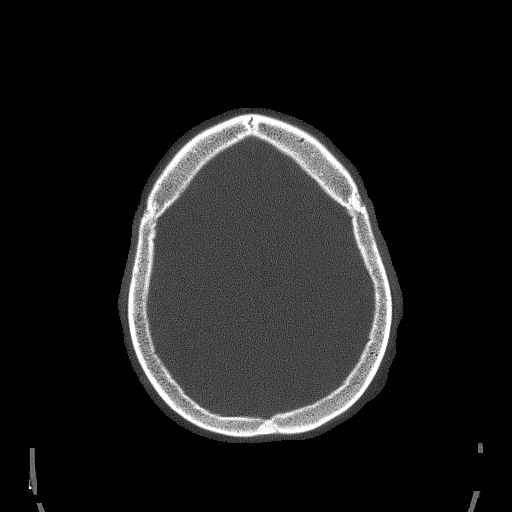
[im 164/198  bone]
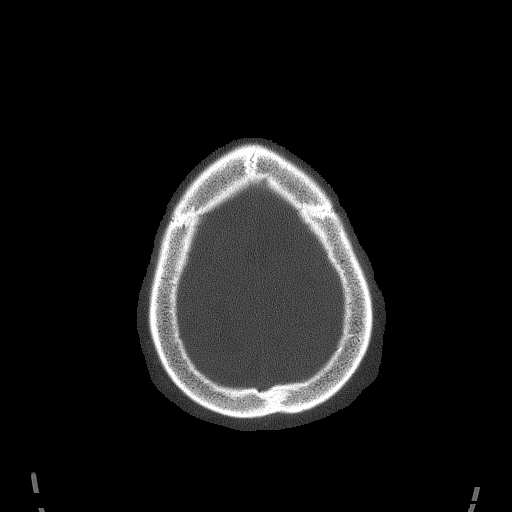
[im 177/198  bone]
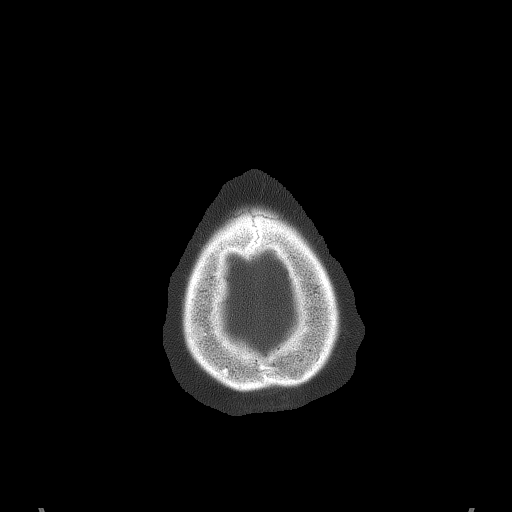
[im 191/198  bone]
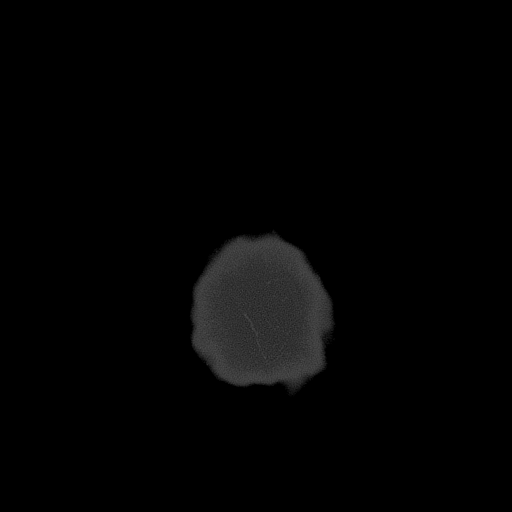

[16 of 30 positions shown; findings below may reference images not displayed]

PROCEDURE:     CT  - CT SINUSES WITHOUT CONTRAST  - August 26, 2011  [DATE]

RESULT:     Axial CT scanning was performed through the paranasal sinuses
using a bone algorithm with reconstructions at 1 mm intervals and slice
thicknesses. Subsequently coronal reconstructions were obtained.

The frontal, ethmoid, maxillary, and sphenoid sinuses are largely clear.
Inferiorly in the right maxillary sinus a small amount of mucoperiosteal
thickening is seen which may reflect small retention cyst or polyp. There
are no air-fluid levels. The ostiomeatal units of the maxillary sinuses are
patent. The nasal septum is deviated slightly toward the left. The mastoid
air cells are well pneumatized.
IMPRESSION: 1. I do not see evidence of acute paranasal sinus inflammation.
2. Minimal mucoperiosteal thickening inferiorly on the right in the
maxillary sinus may reflect a retention cyst or polyp.

## 2013-02-18 IMAGING — CT CT HEAD WITHOUT CONTRAST
1 series · 16 of 30 positions shown, 20 images · non-contrast
Comparison: none

REASON FOR EXAM: Severe Headaches
COMMENTS:

[Series 2: 1 soft tissue · axial · 0.42mm/px · z∈[+704,+849]mm · 16 of 33 slices shown, 20 images]
[im 2/33  brain]
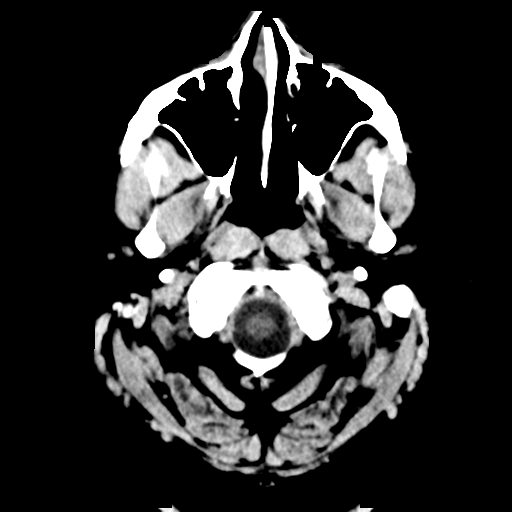
[im 2/33  bone]
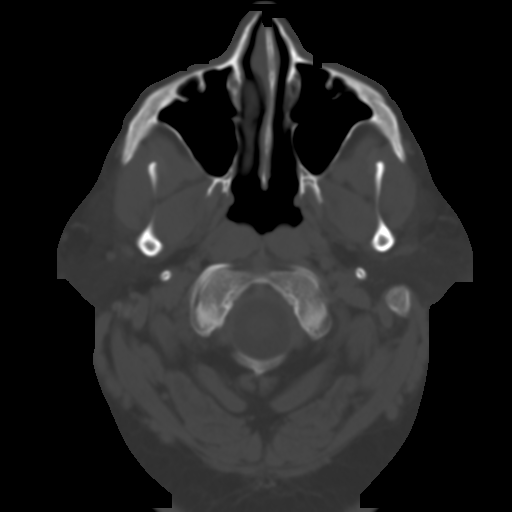
[im 4/33  brain]
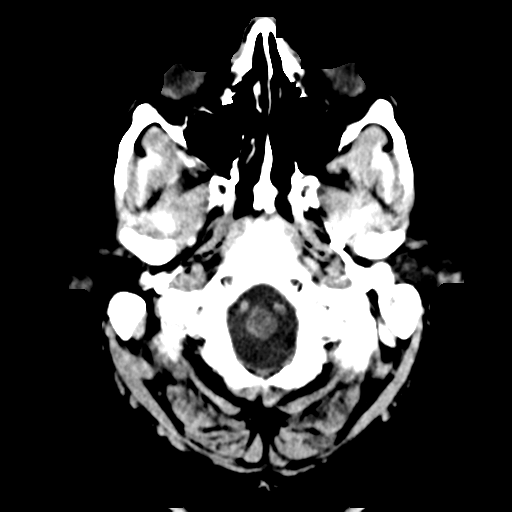
[im 6/33  brain]
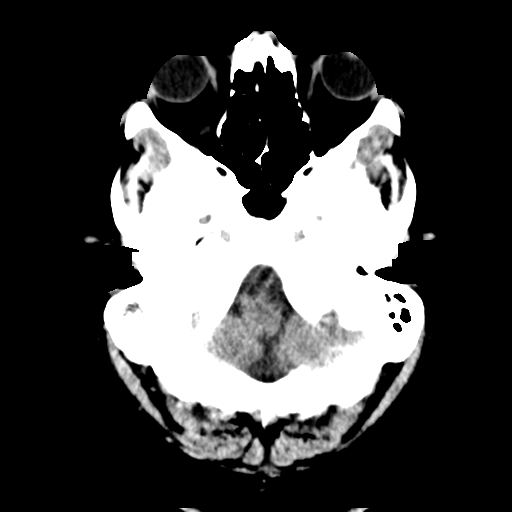
[im 8/33  brain]
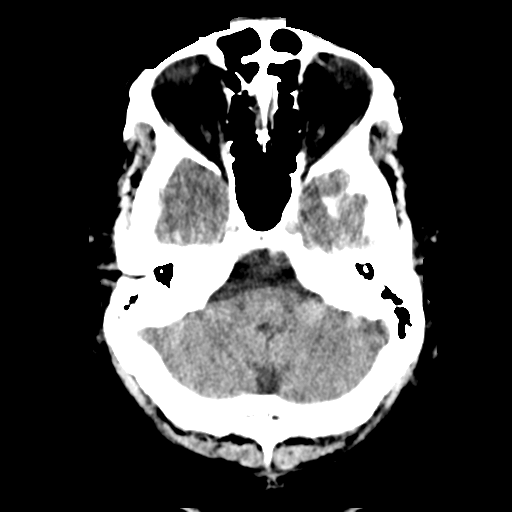
[im 9/33  brain]
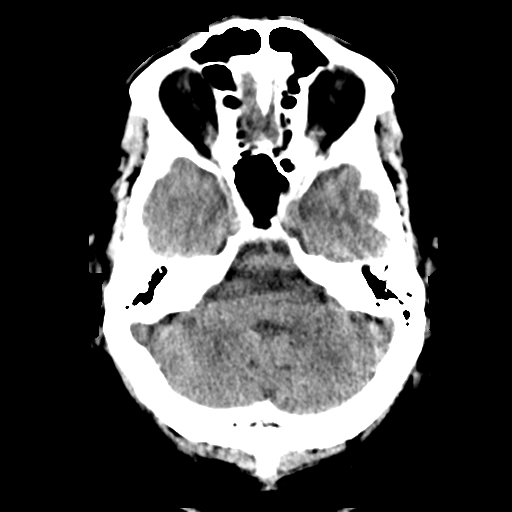
[im 9/33  bone]
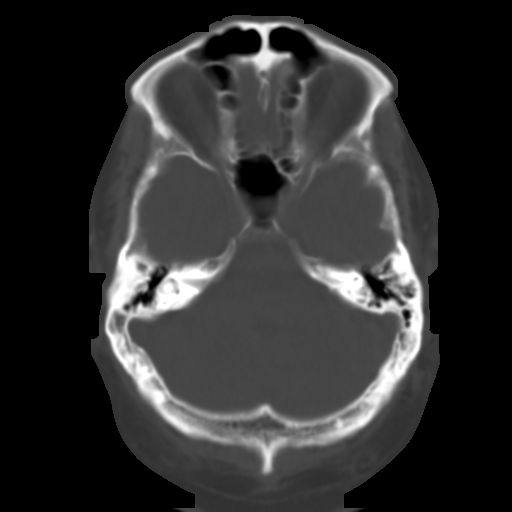
[im 12/33  brain]
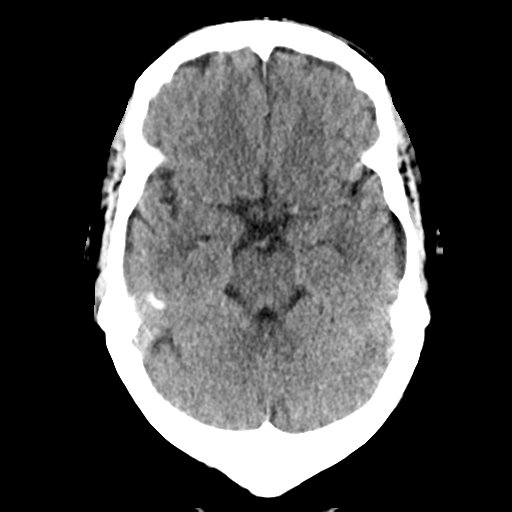
[im 14/33  brain]
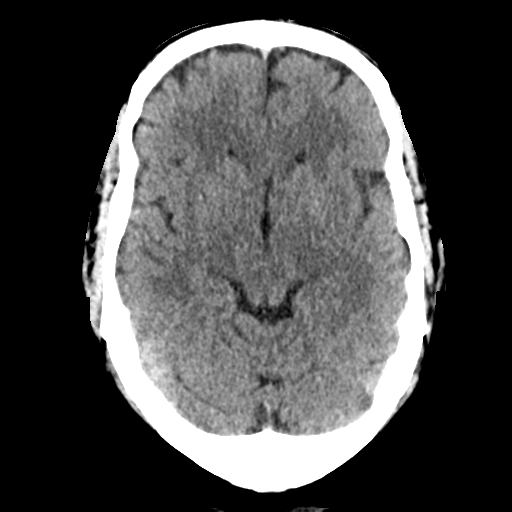
[im 16/33  brain]
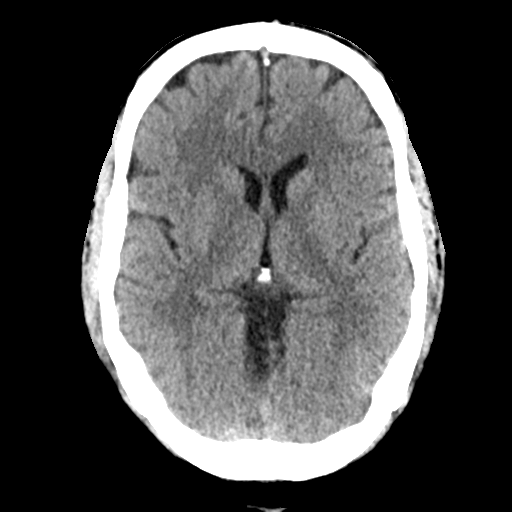
[im 17/33  brain]
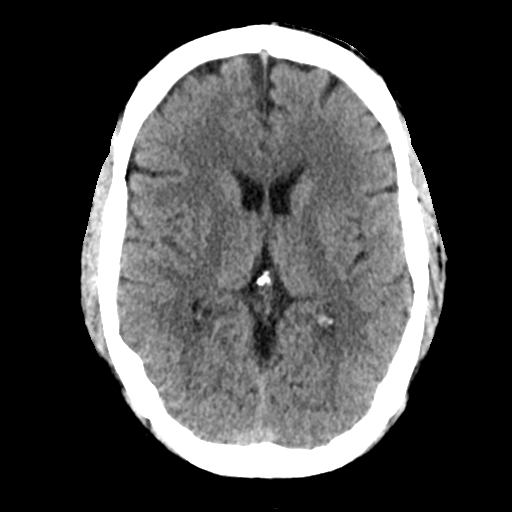
[im 17/33  bone]
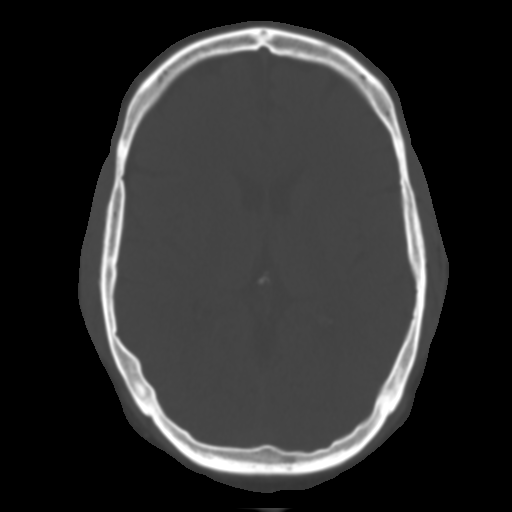
[im 19/33  brain]
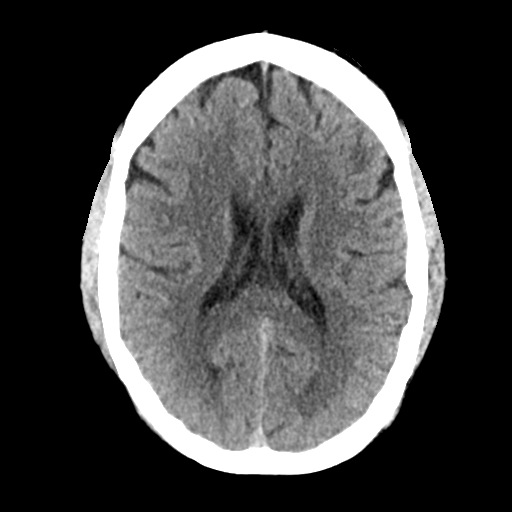
[im 21/33  brain]
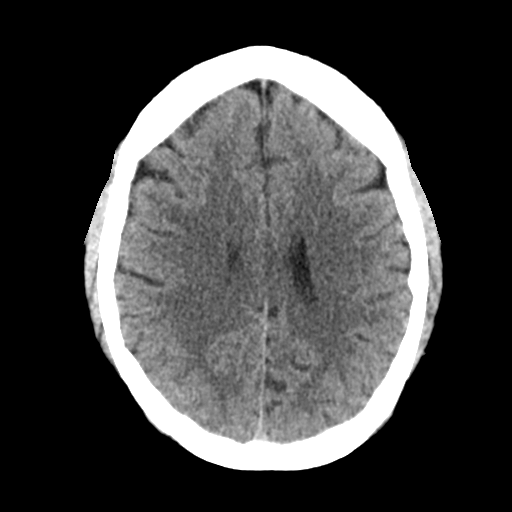
[im 24/33  brain]
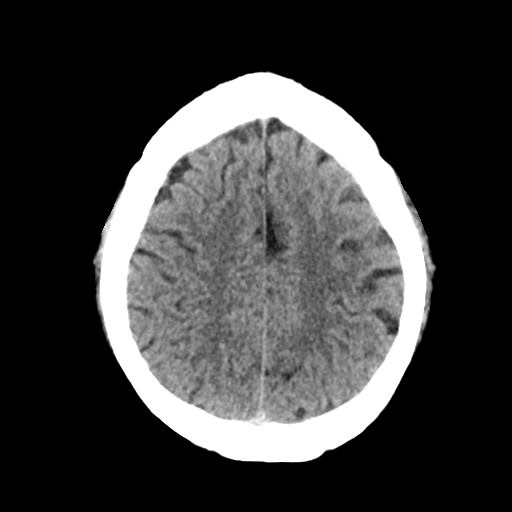
[im 25/33  brain]
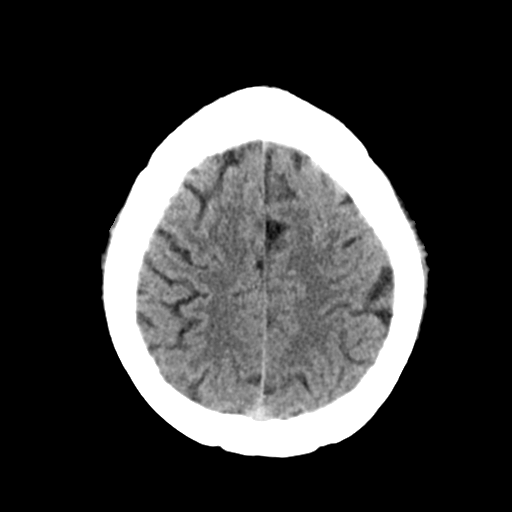
[im 25/33  bone]
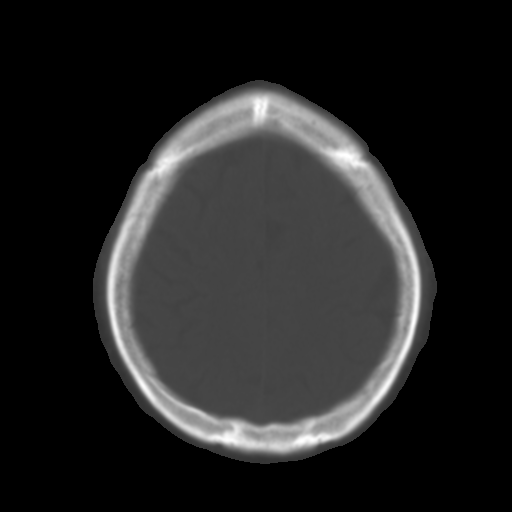
[im 27/33  brain]
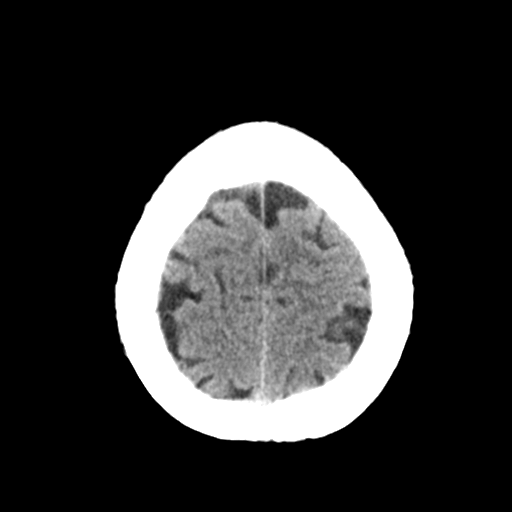
[im 29/33  brain]
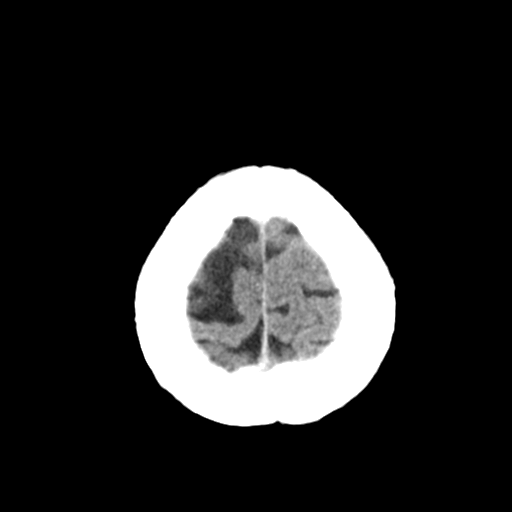
[im 31/33  brain]
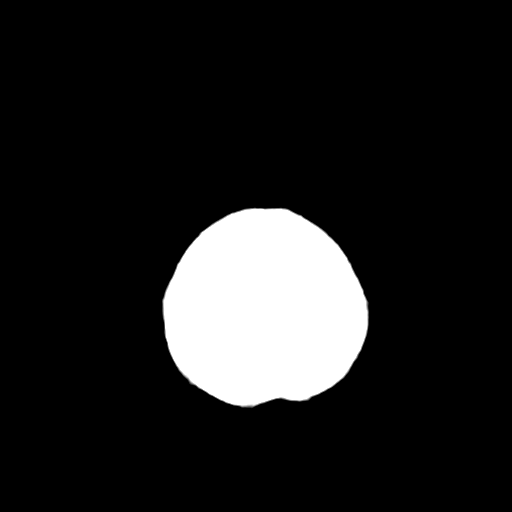

[16 of 30 positions shown; findings below may reference images not displayed]

PROCEDURE:     KCT - KCT HEAD WITHOUT CONTRAST  - August 28, 2011  [DATE]

RESULT:     Axial noncontrast CT scanning was performed through the brain
with reconstructions at 5 mm intervals and slice thicknesses.

The ventricles are normal in size and position. There is no intracranial
hemorrhage nor intracranial mass effect. I see no abnormal intracranial
calcifications. There is no evidence of an evolving ischemic infarction. At
bone window settings the observed portions of the paranasal sinuses and
mastoid air cells are clear.
IMPRESSION: I see no acute intracranial abnormality.

## 2013-08-26 IMAGING — CR DG SHOULDER 3+V*L*
1 series · 3 of 3 positions shown · non-contrast
Comparison: None

REASON FOR EXAM: pain s/p fall at work
COMMENTS:

PROCEDURE:     DXR - DXR SHOULDER LEFT COMPLETE  - March 04, 2012  [DATE]
RESULT:     History: Fall

[Series 1: w shoulder external left · 0.14mm/px · 3 of 3 slices shown]
[im 1/3]
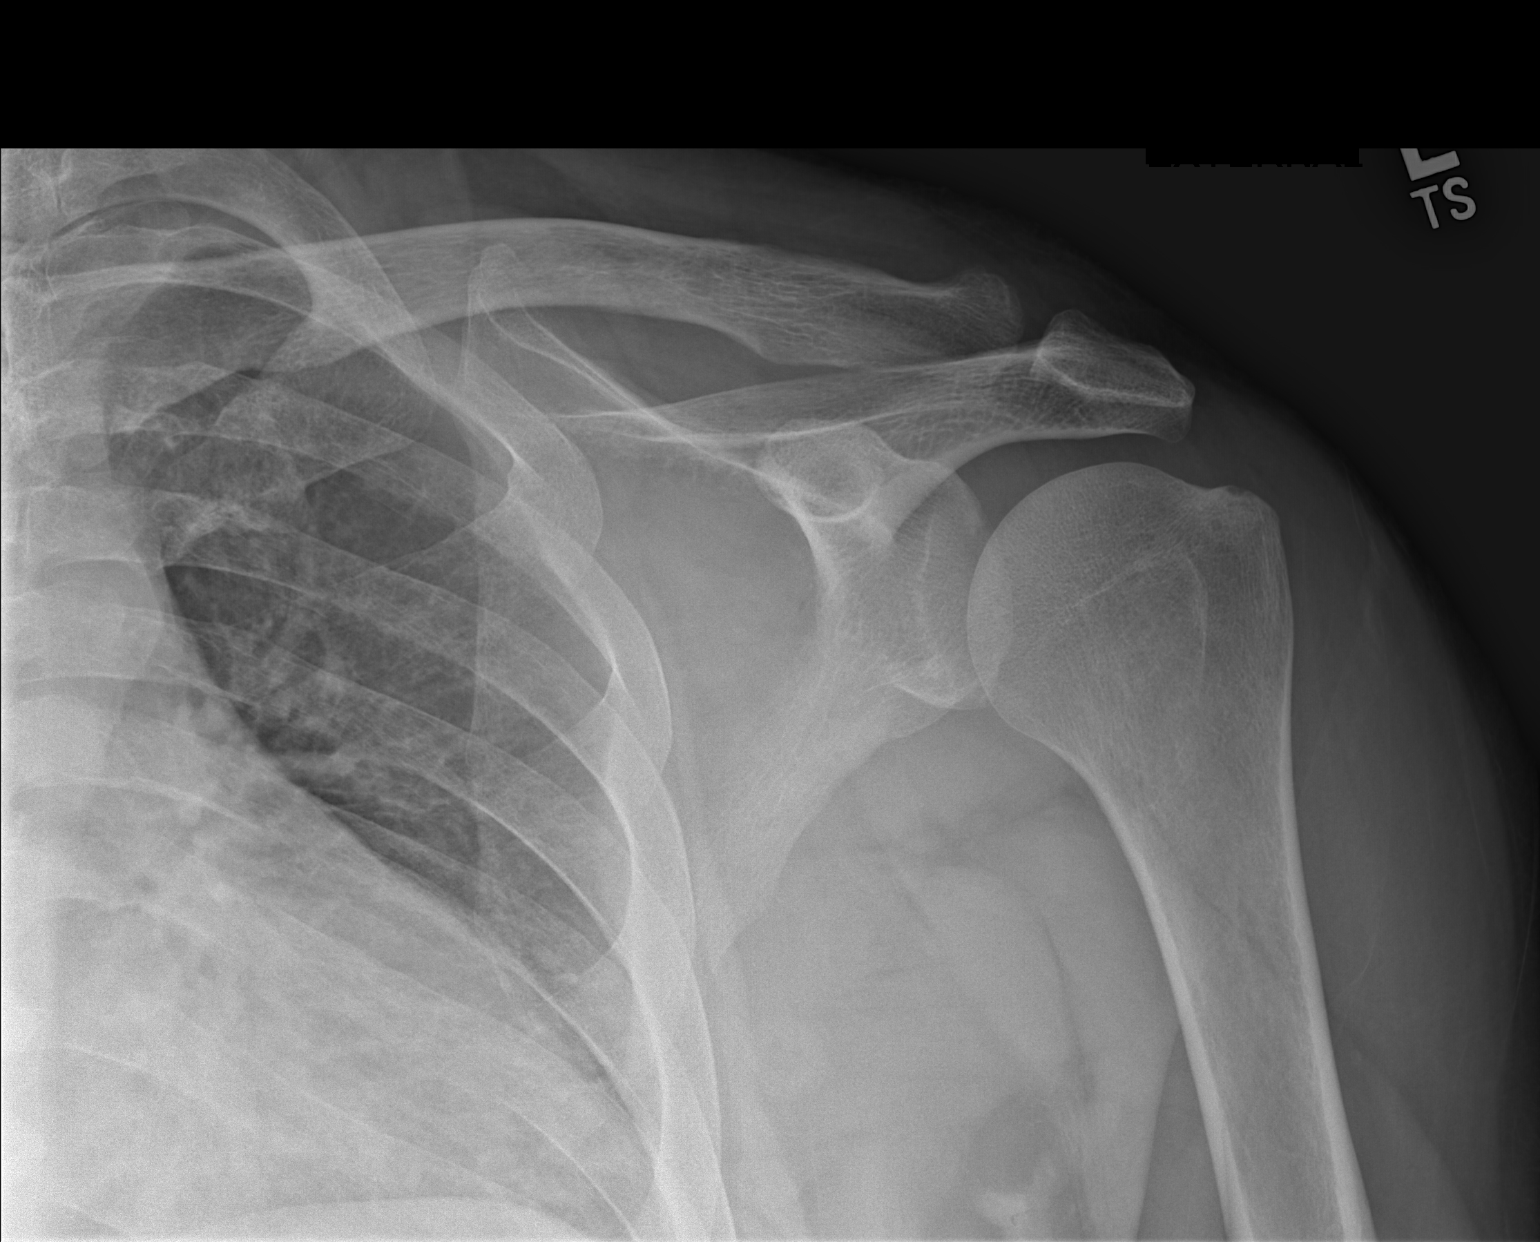
[im 2/3]
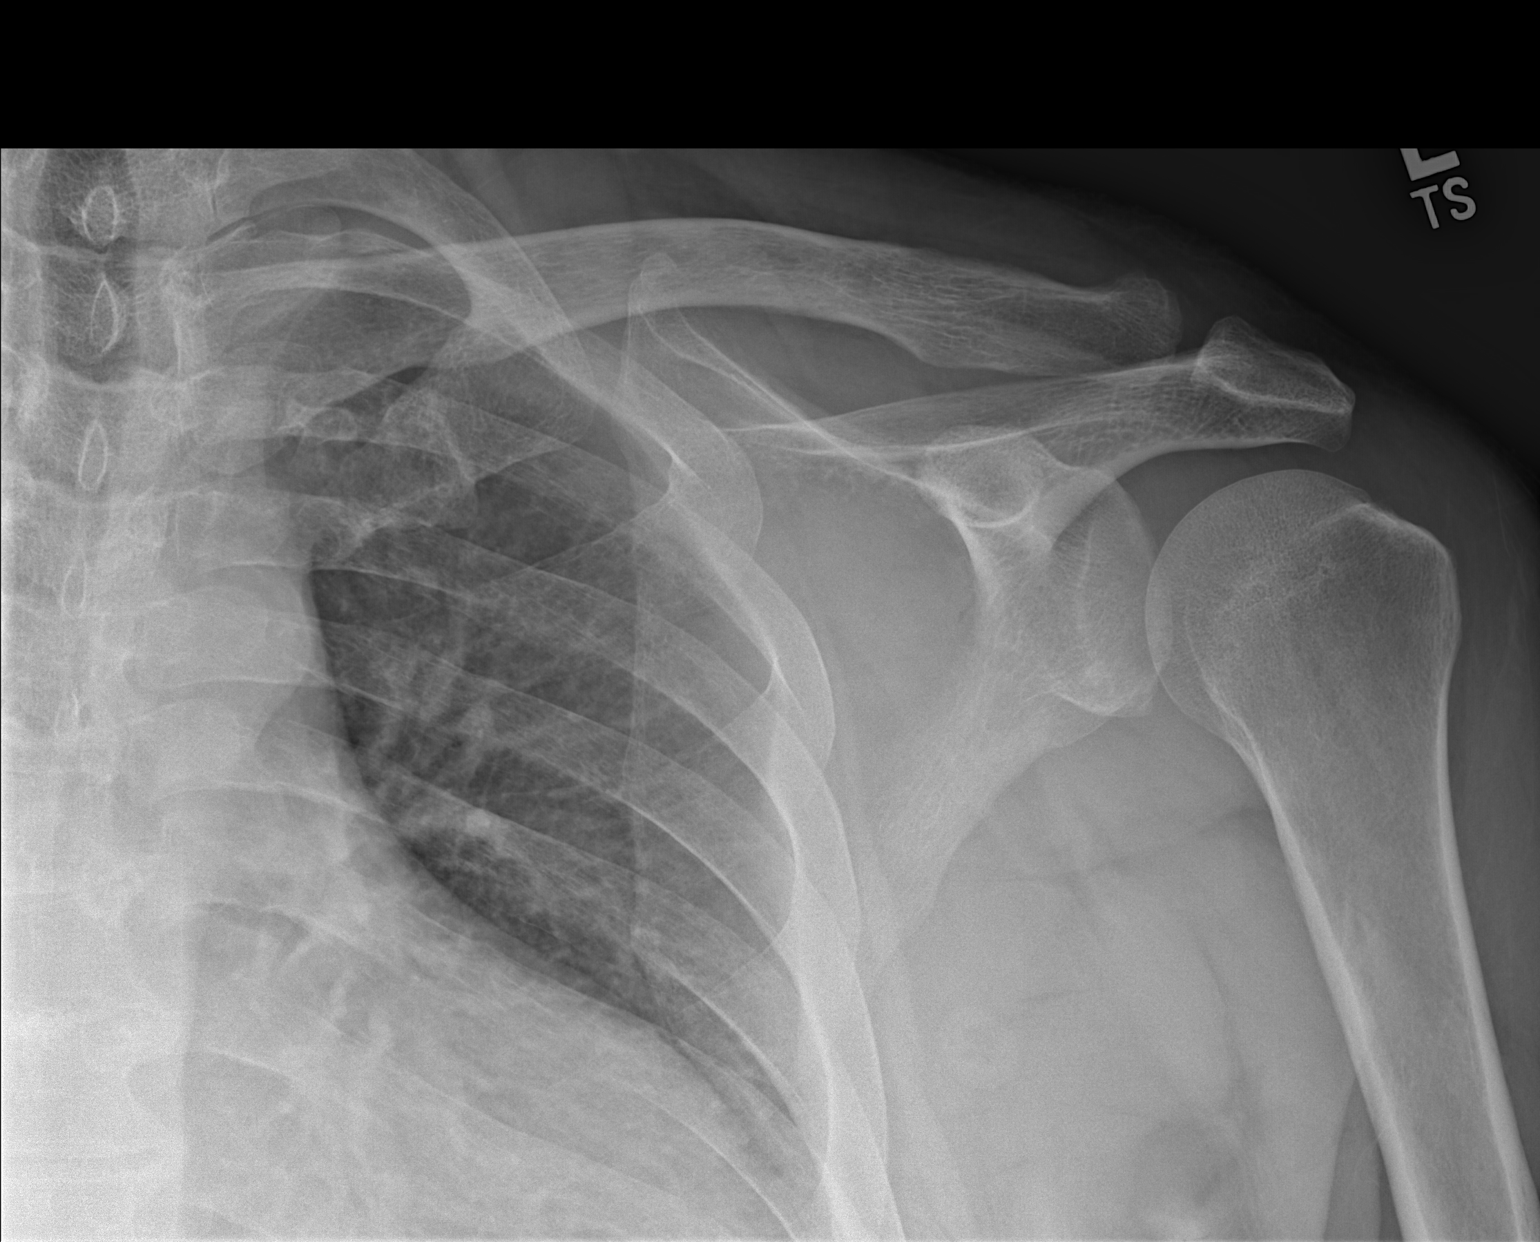
[im 3/3]
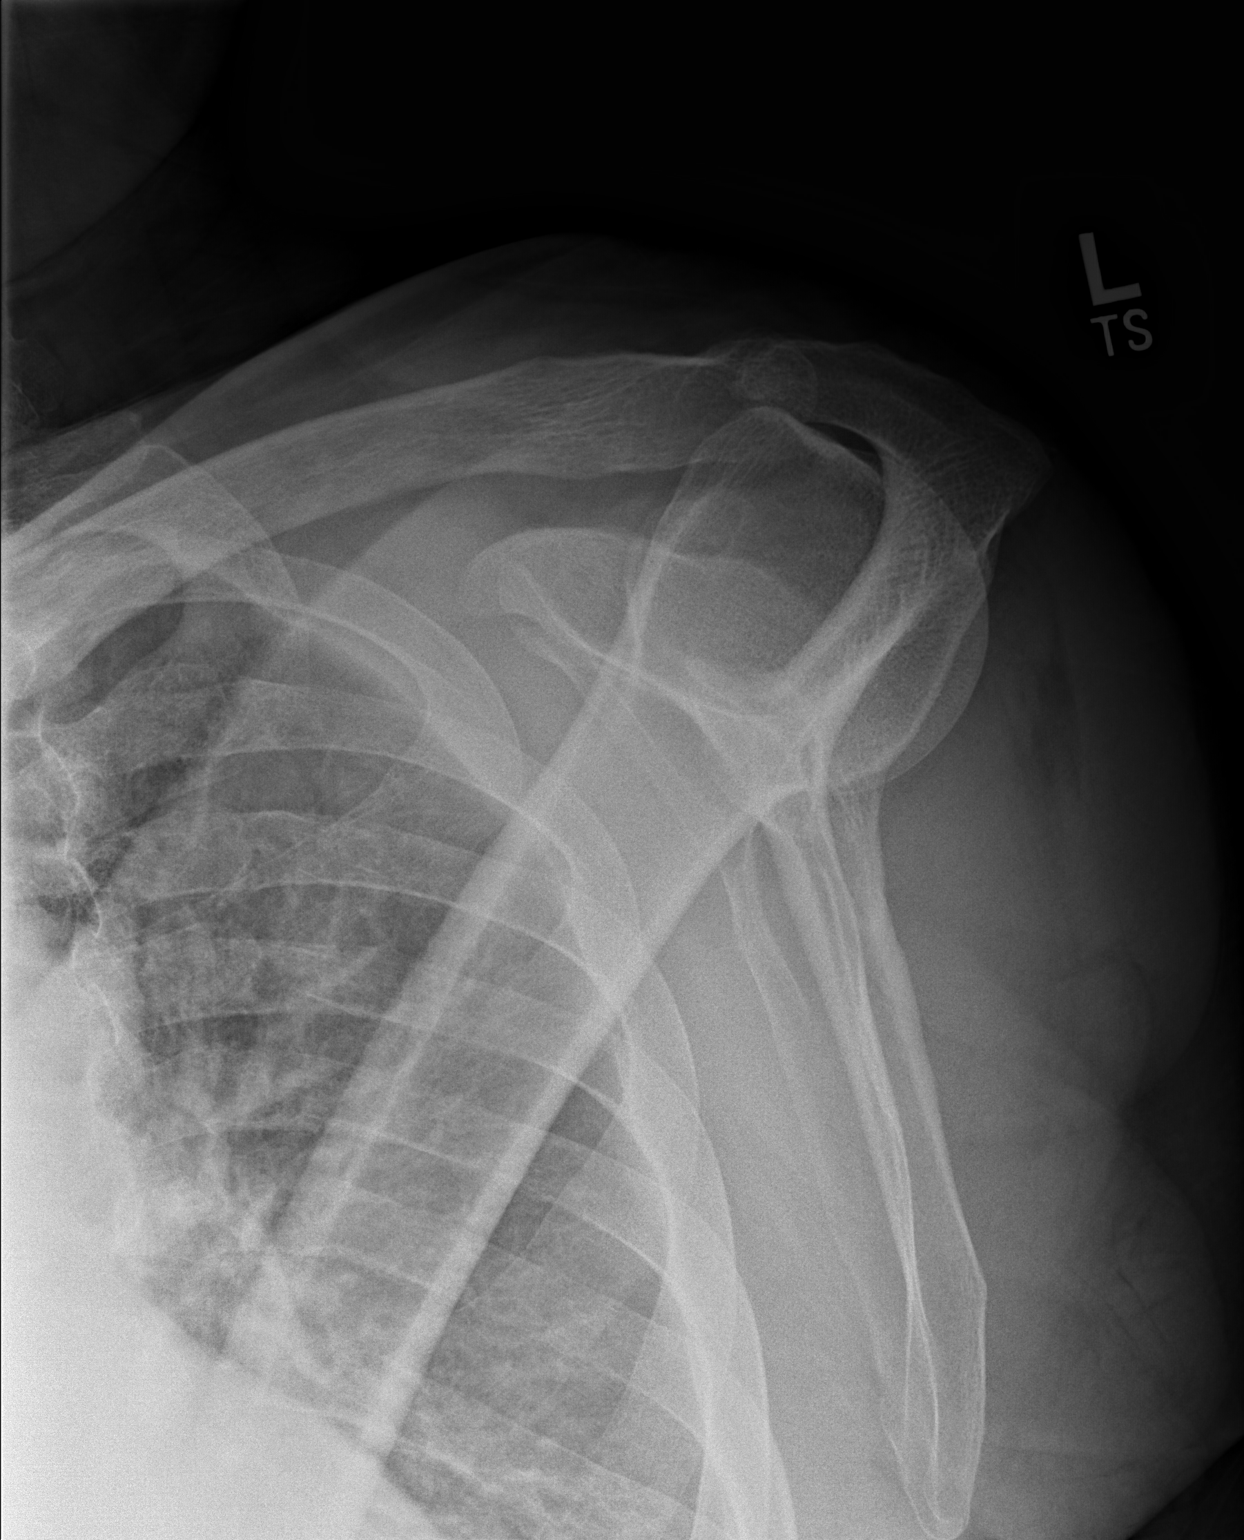

[3 of 3 positions shown; findings below may reference images not displayed]

FINDINGS: 3 views of the left shoulder demonstrates no fracture or dislocation. The
acromioclavicular joint is normal.
IMPRESSION: No acute osseous injury of the left shoulder.

[REDACTED]

## 2013-10-06 IMAGING — CT CT HEAD WITHOUT CONTRAST
2 series · 16 of 30 positions shown, 20 images · non-contrast
Comparison: none

REASON FOR EXAM: severe headache - second visit in two days for same -
refractory to treatment
COMMENTS:

[Series 2: without · axial · non-contrast · 0.46mm/px · z∈[+464,+604]mm · 13 of 34 slices shown, 17 images]
[im 3/34  brain]
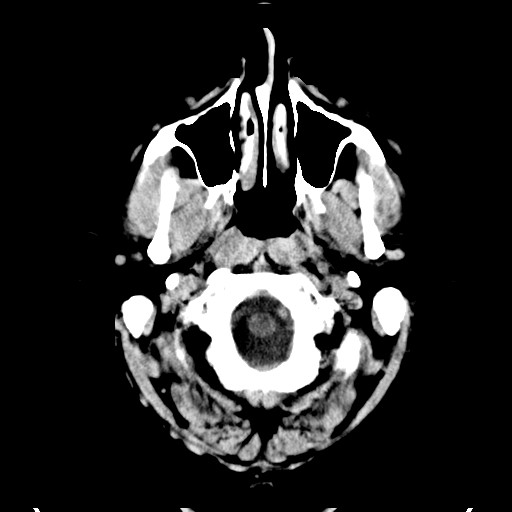
[im 3/34  bone]
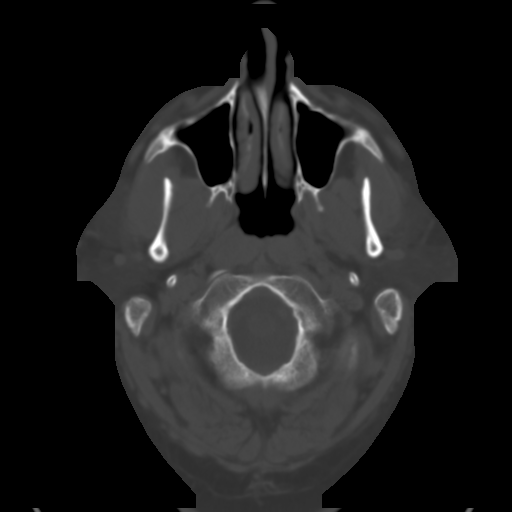
[im 5/34  brain]
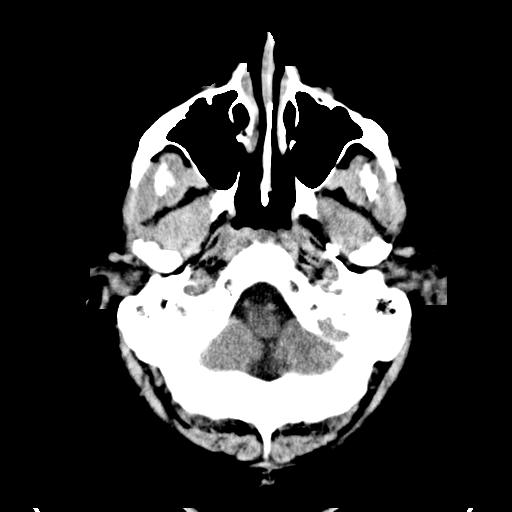
[im 8/34  brain]
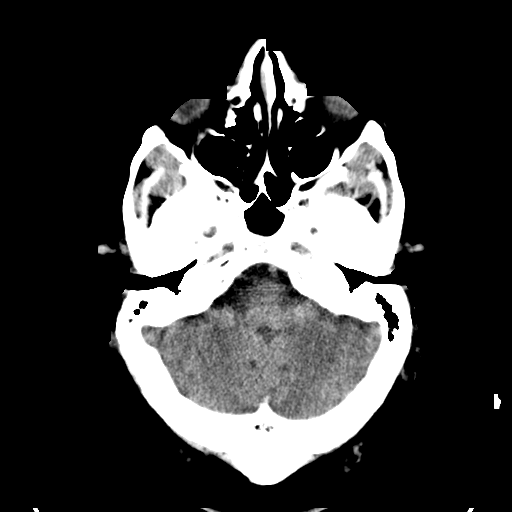
[im 10/34  brain]
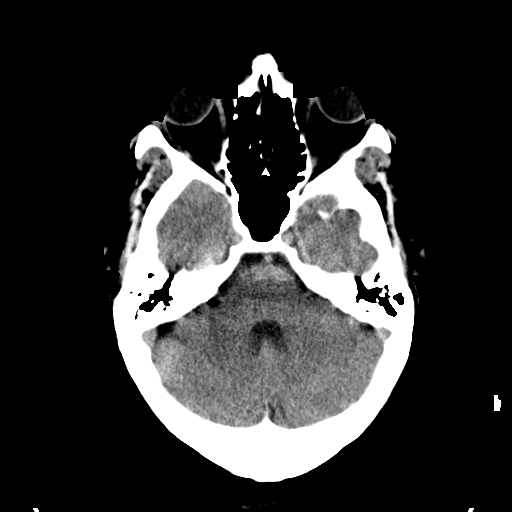
[im 12/34  brain]
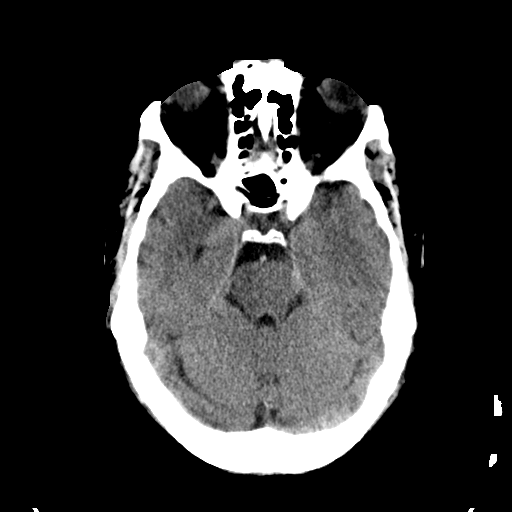
[im 12/34  bone]
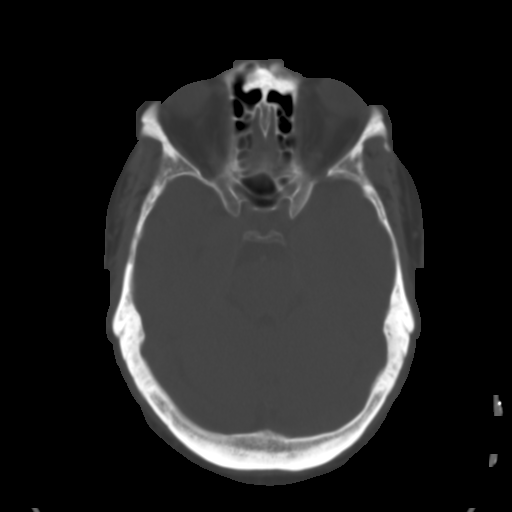
[im 15/34  brain]
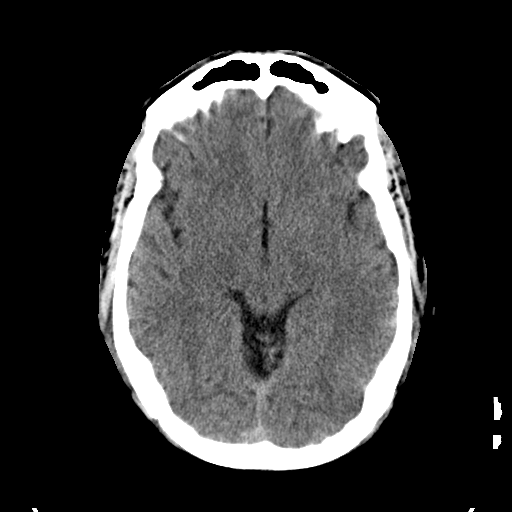
[im 17/34  brain]
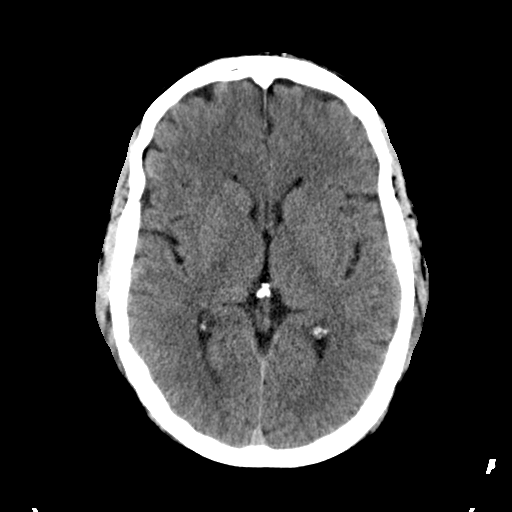
[im 19/34  brain]
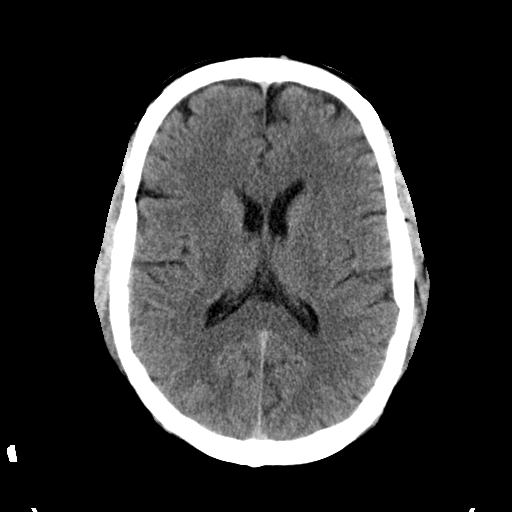
[im 22/34  brain]
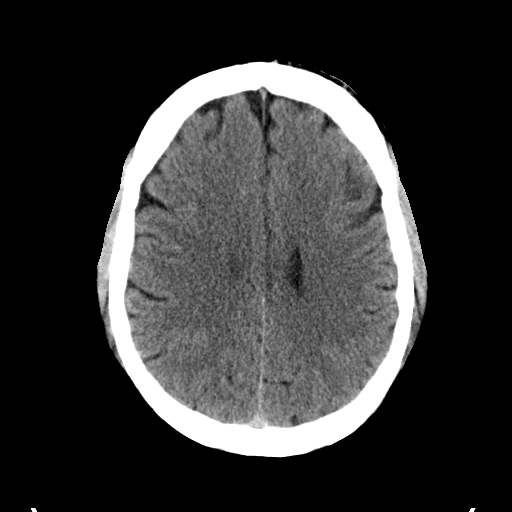
[im 22/34  bone]
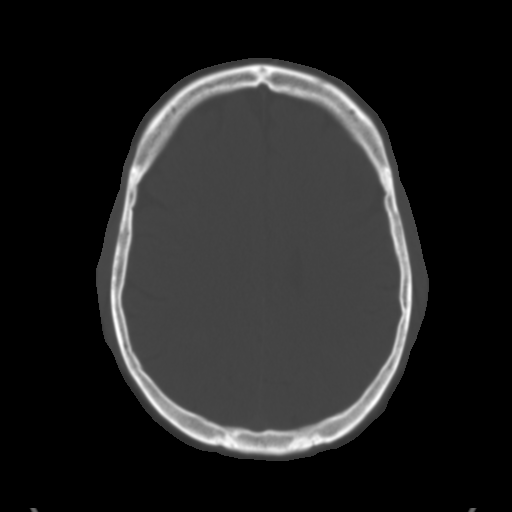
[im 24/34  brain]
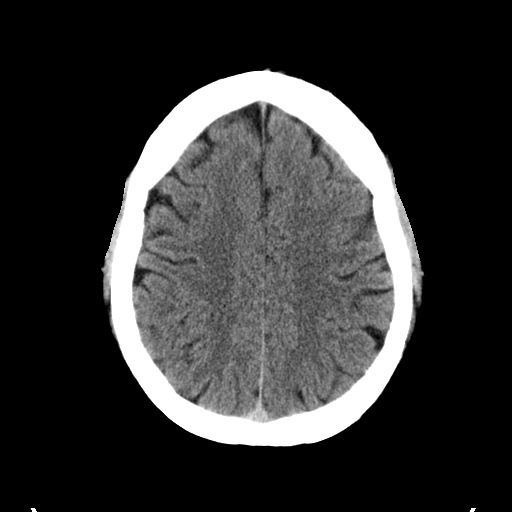
[im 26/34  brain]
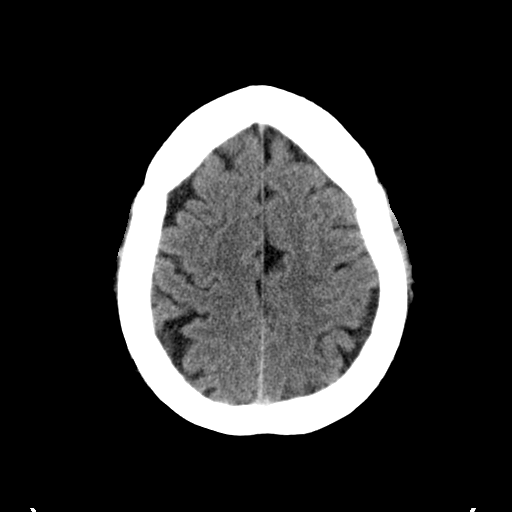
[im 29/34  brain]
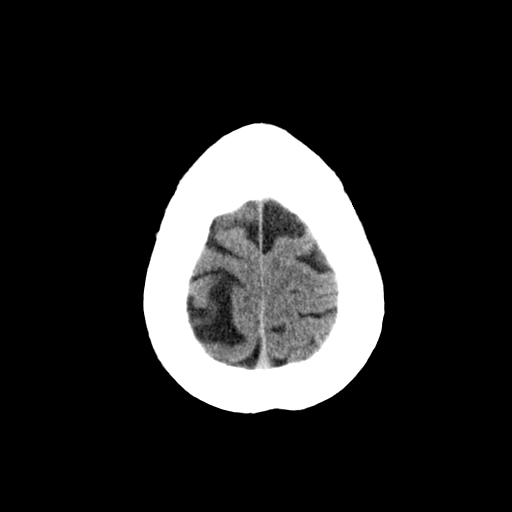
[im 31/34  brain]
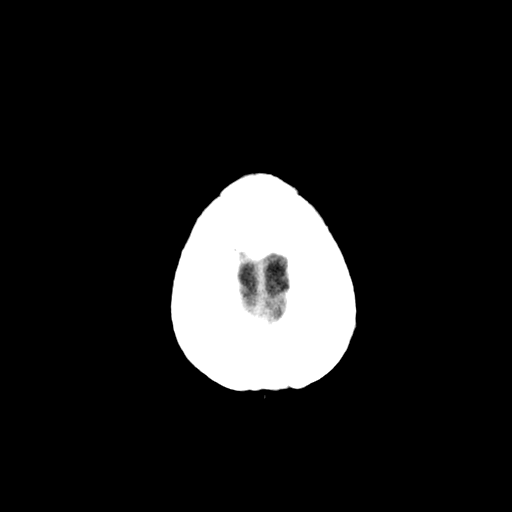
[im 31/34  bone]
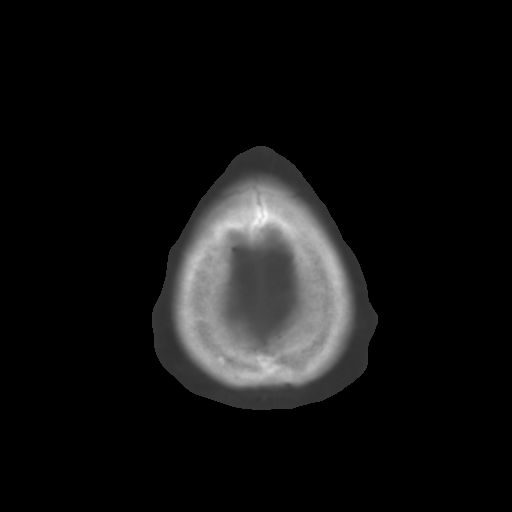

[Series 3: bone · axial · 0.46mm/px · z∈[+464,+509]mm · 3 of 34 slices shown]
[im 3/34  bone]
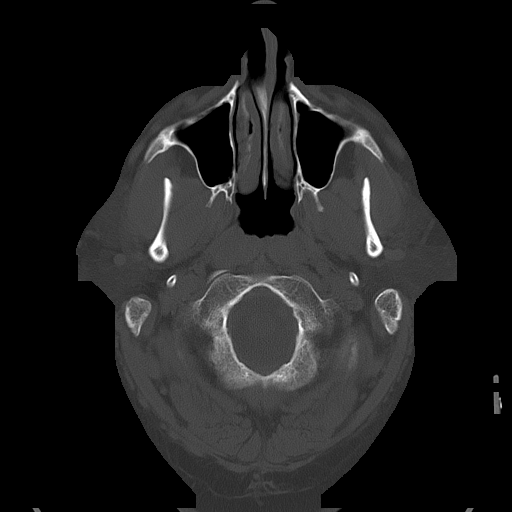
[im 8/34  bone]
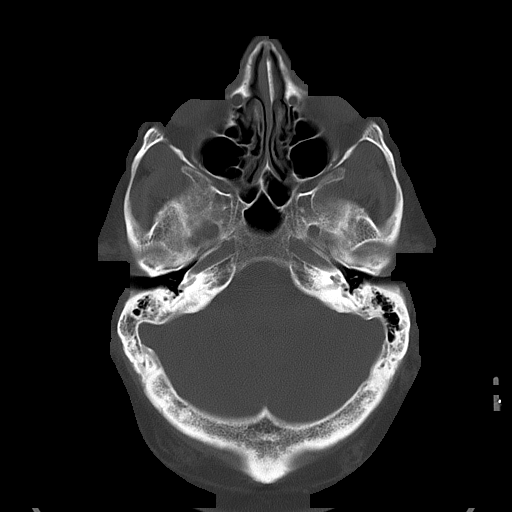
[im 12/34  bone]
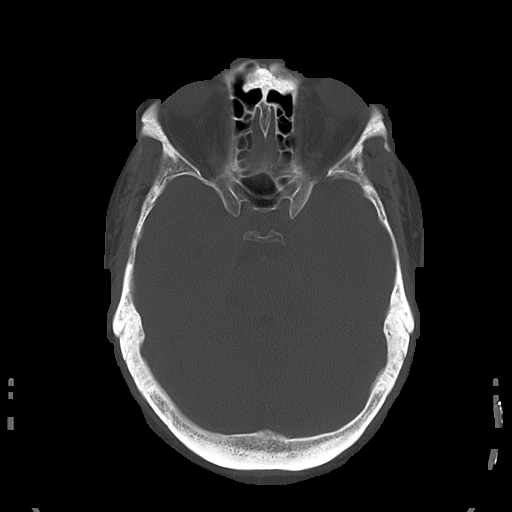

[16 of 30 positions shown; findings below may reference images not displayed]

PROCEDURE:     CT  - CT HEAD WITHOUT CONTRAST  - April 14, 2012  [DATE]

RESULT:     Emergent noncontrast CT of the brain is compared to the previous
examination dated 28 August, 2011.

The ventricles and sulci are normal. There is no hemorrhage. There is no
focal mass, mass-effect or midline shift. There is no evidence of edema or
territorial infarct. The bone windows demonstrate normal aeration of the
paranasal sinuses and mastoid air cells with the exception of chronic
sclerosis in the right mastoid tip. There is no skull fracture demonstrated.
IMPRESSION: 1. No acute intracranial abnormality.

[REDACTED]

## 2013-10-07 ENCOUNTER — Emergency Department: Payer: Self-pay | Admitting: Emergency Medicine

## 2013-10-07 LAB — BASIC METABOLIC PANEL
Anion Gap: 7 (ref 7–16)
BUN: 10 mg/dL (ref 7–18)
Calcium, Total: 8.9 mg/dL (ref 8.5–10.1)
Chloride: 107 mmol/L (ref 98–107)
Co2: 25 mmol/L (ref 21–32)
Creatinine: 0.77 mg/dL (ref 0.60–1.30)
EGFR (African American): >60
EGFR (Non-African Amer.): >60
Glucose: 85 mg/dL (ref 65–99)
Osmolality: 276 (ref 275–301)
Potassium: 3.8 mmol/L (ref 3.5–5.1)
Sodium: 139 mmol/L (ref 136–145)

## 2013-10-07 LAB — CBC
HCT: 46.1 % (ref 40.0–52.0)
HGB: 15.5 g/dL (ref 13.0–18.0)
MCH: 28.4 pg (ref 26.0–34.0)
MCHC: 33.6 g/dL (ref 32.0–36.0)
MCV: 85 fL (ref 80–100)
Platelet: 227 10*3/uL (ref 150–440)
RBC: 5.45 10*6/uL (ref 4.40–5.90)
RDW: 14.6 % — ABNORMAL HIGH (ref 11.5–14.5)
WBC: 15.7 10*3/uL — ABNORMAL HIGH (ref 3.8–10.6)

## 2013-10-07 LAB — TROPONIN I: Troponin-I: 0.03 ng/mL

## 2013-11-08 DIAGNOSIS — R918 Other nonspecific abnormal finding of lung field: Secondary | ICD-10-CM | POA: Insufficient documentation

## 2014-02-17 IMAGING — CR DG CHEST 2V
1 series · 2 of 2 positions shown · non-contrast
Comparison: none

REASON FOR EXAM: chest pain, cough
COMMENTS:

PROCEDURE:     DXR - DXR CHEST PA (OR AP) AND LATERAL  - August 26, 2012 [DATE]
RESULT:     Comparison: None

[Series 1: w chest pa · 0.14mm/px · 2 of 2 slices shown]
[im 1/2]
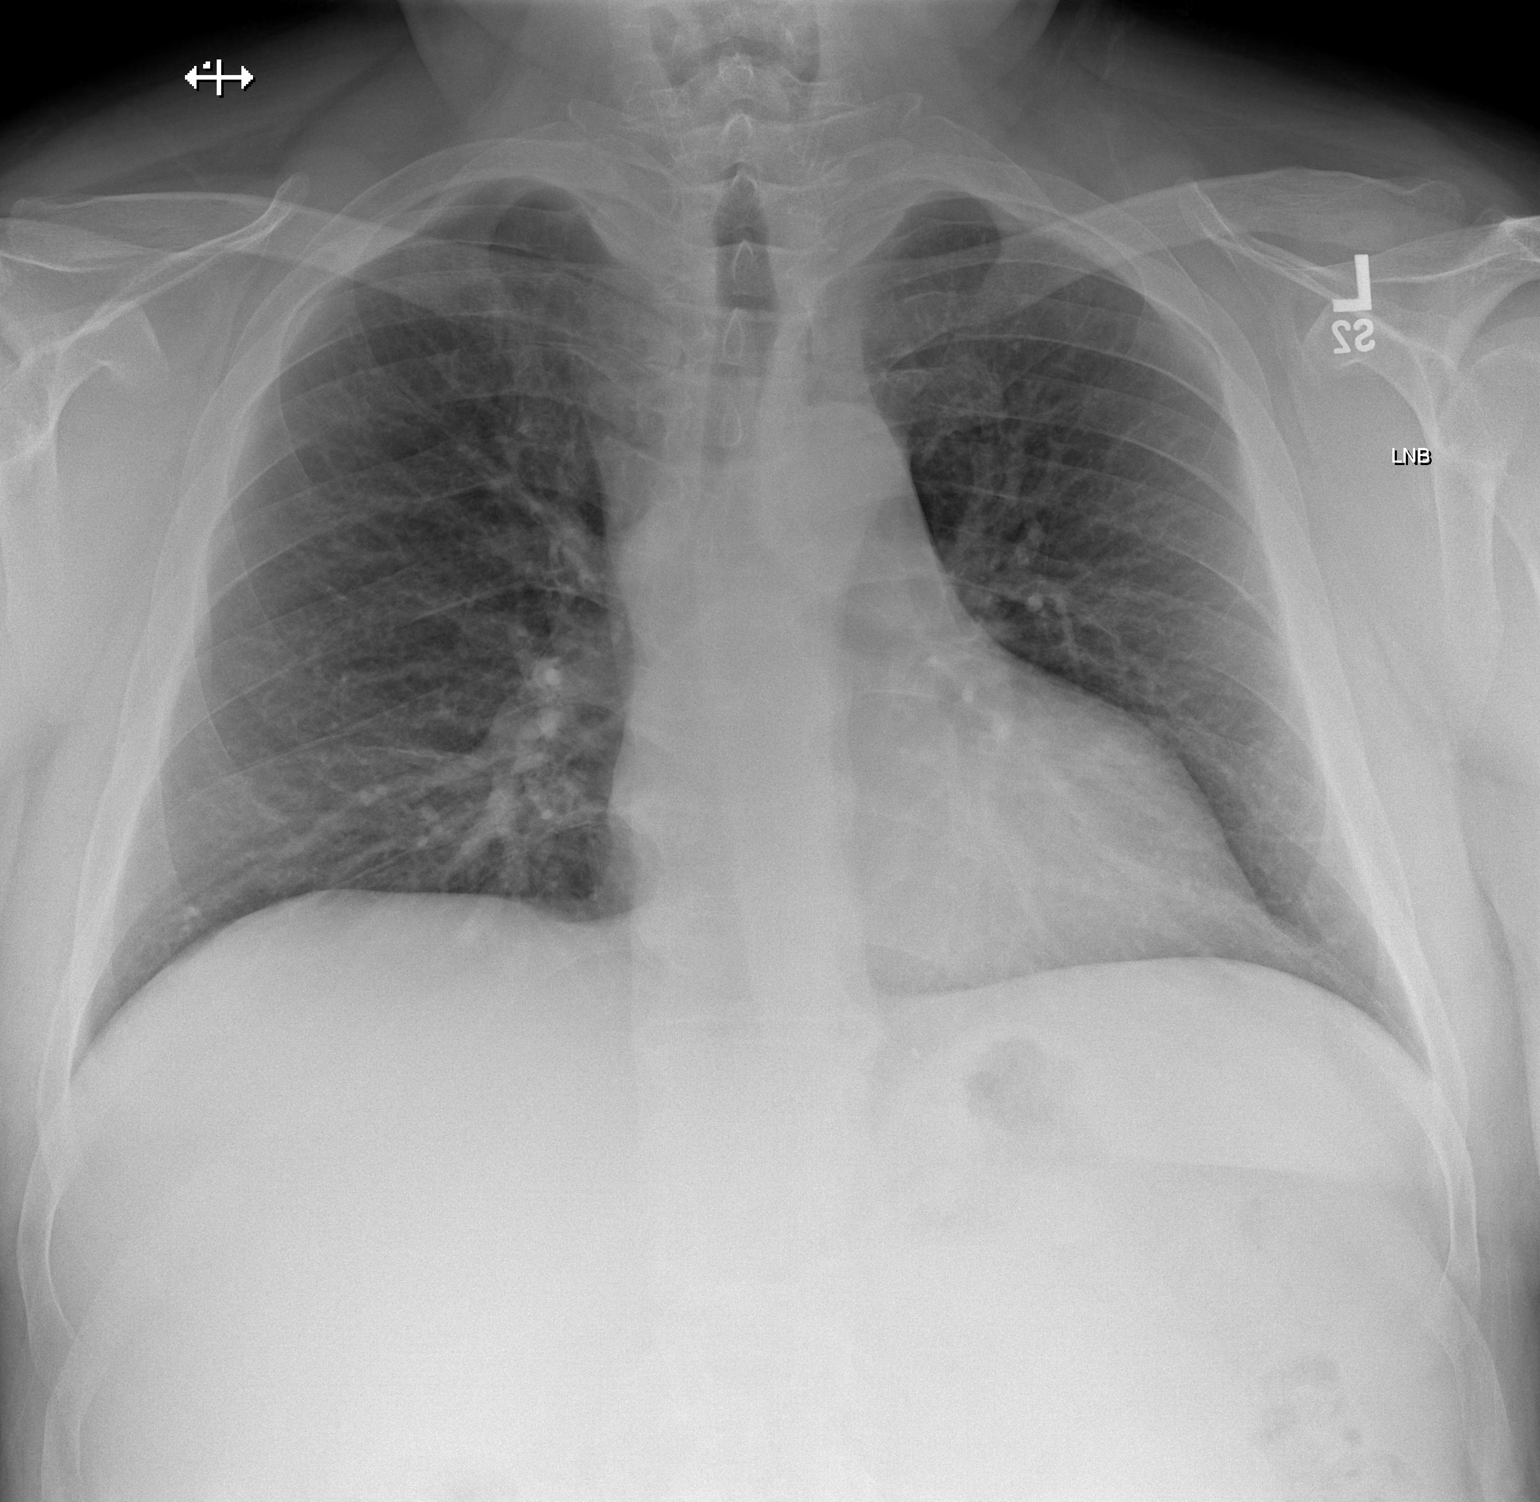
[im 2/2]
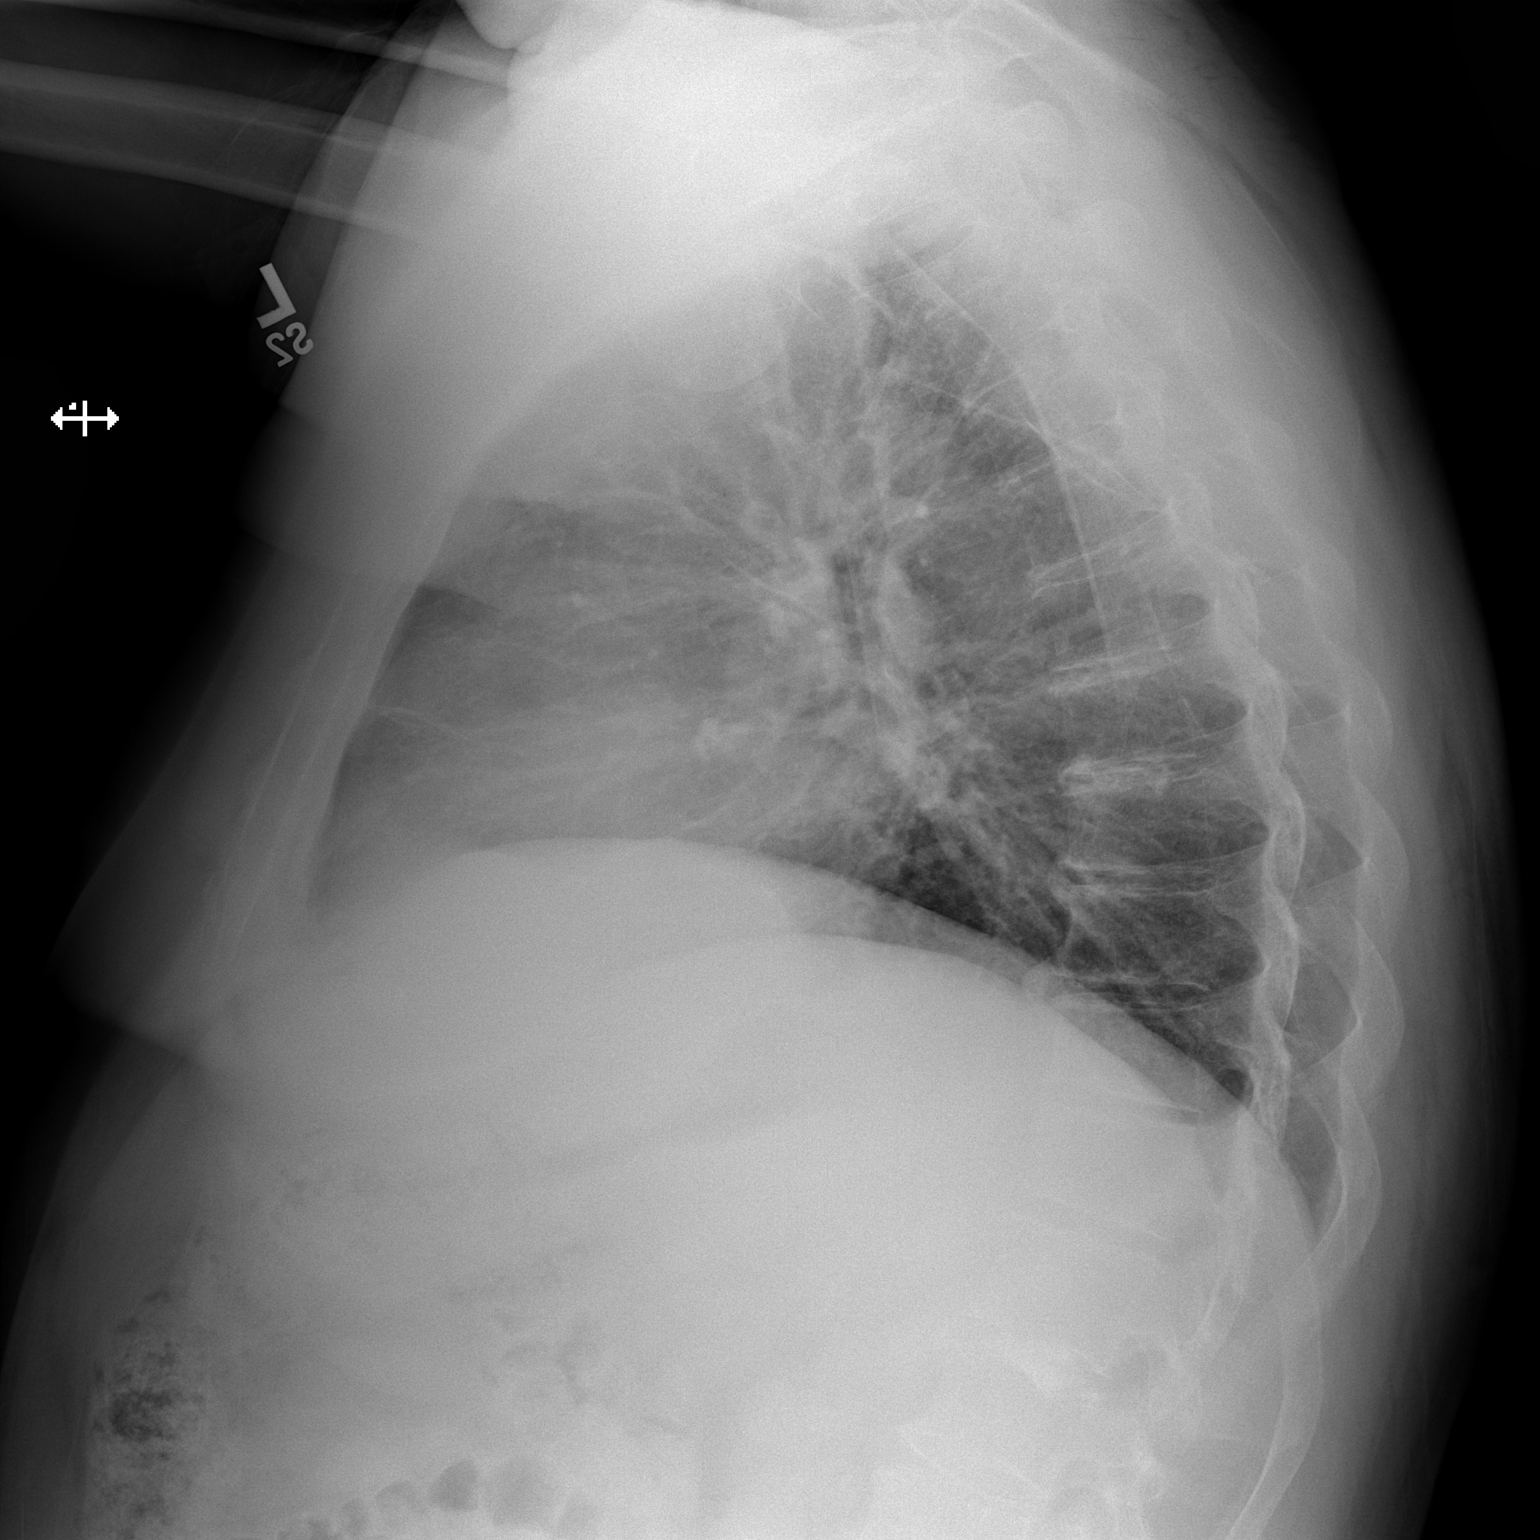

[2 of 2 positions shown; findings below may reference images not displayed]

FINDINGS: PA and lateral chest radiographs are provided.  There is no focal
parenchymal opacity, pleural effusion, or pneumothorax. The heart and
mediastinum are unremarkable.  The osseous structures are unremarkable.
IMPRESSION: No acute disease of the che[REDACTED]

## 2014-05-10 IMAGING — CR PARANASAL SINUSES - COMPLETE 3 + VIEW
1 series · 5 of 5 positions shown · non-contrast
Comparison: none

REASON FOR EXAM: acute maxillary sinusitis
COMMENTS:

PROCEDURE:     DXR - DXR SINUSES PARANASAL COMPLETE  - November 16, 2012  [DATE]
RESULT:     Paranasal sinus images demonstrate normal aeration without bony
destruction. No radiopaque foreign body is evident. The nasal septum
approximates midline.

[Series 1: w waters pa · 0.14mm/px · 5 of 5 slices shown]
[im 1/5]
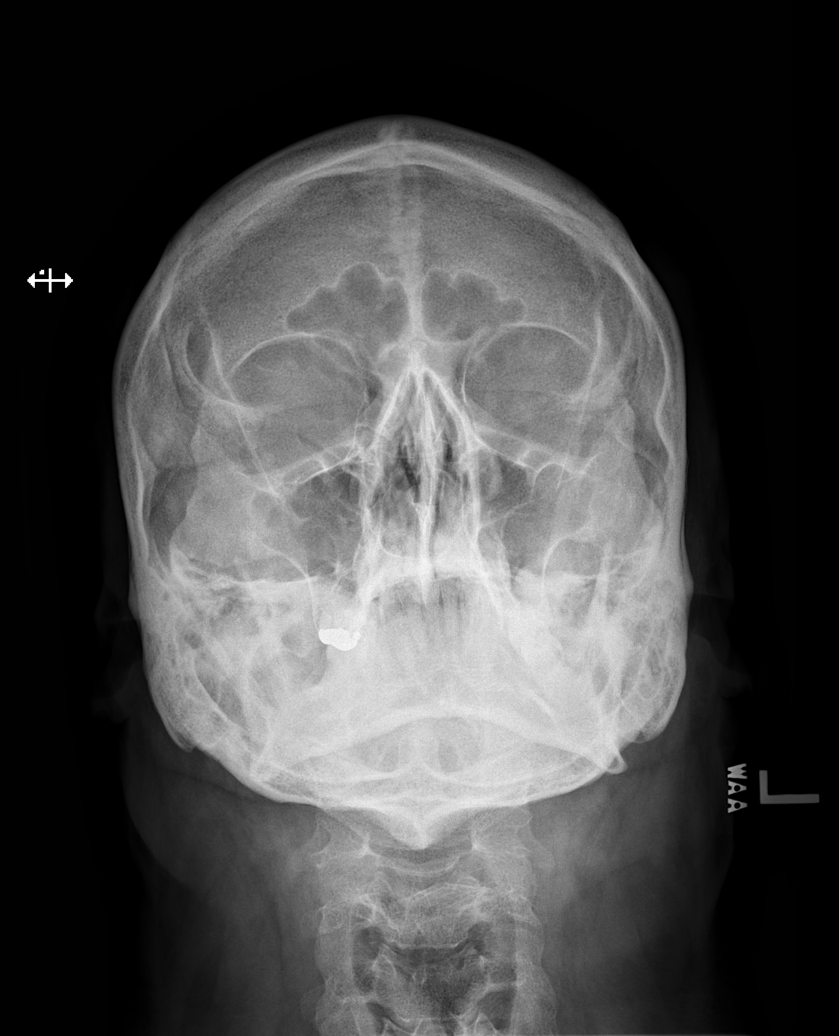
[im 2/5]
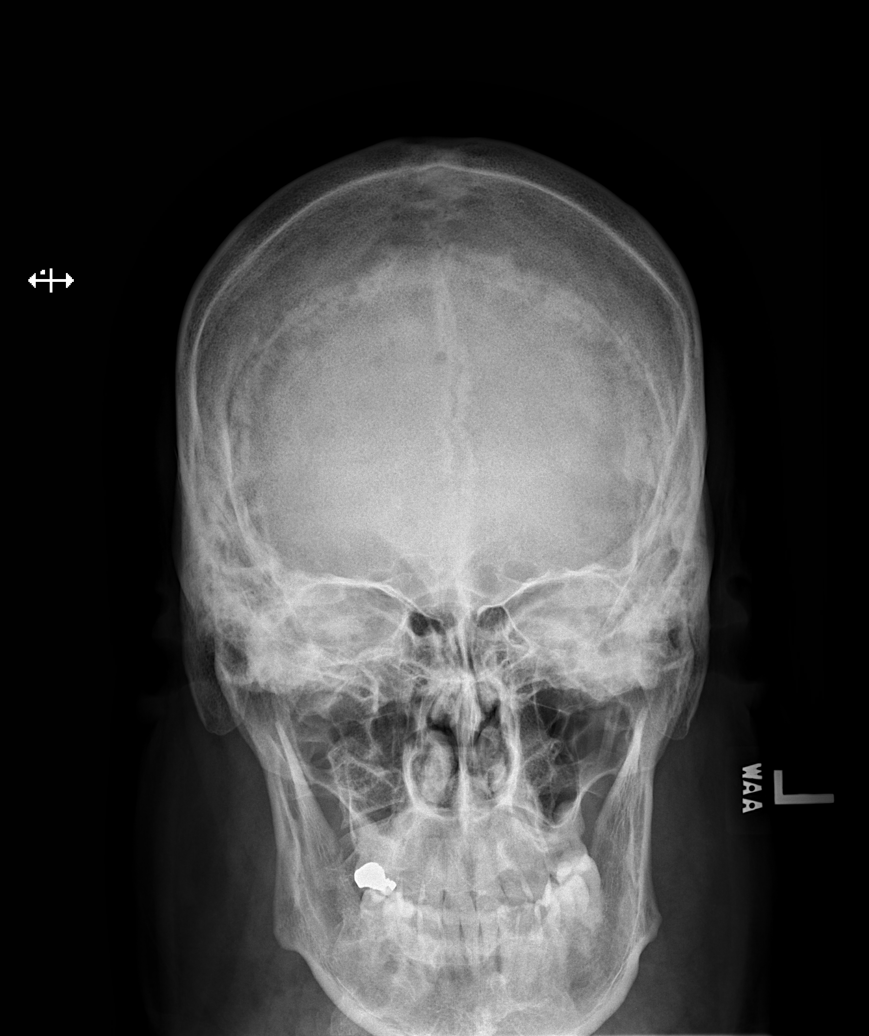
[im 3/5]
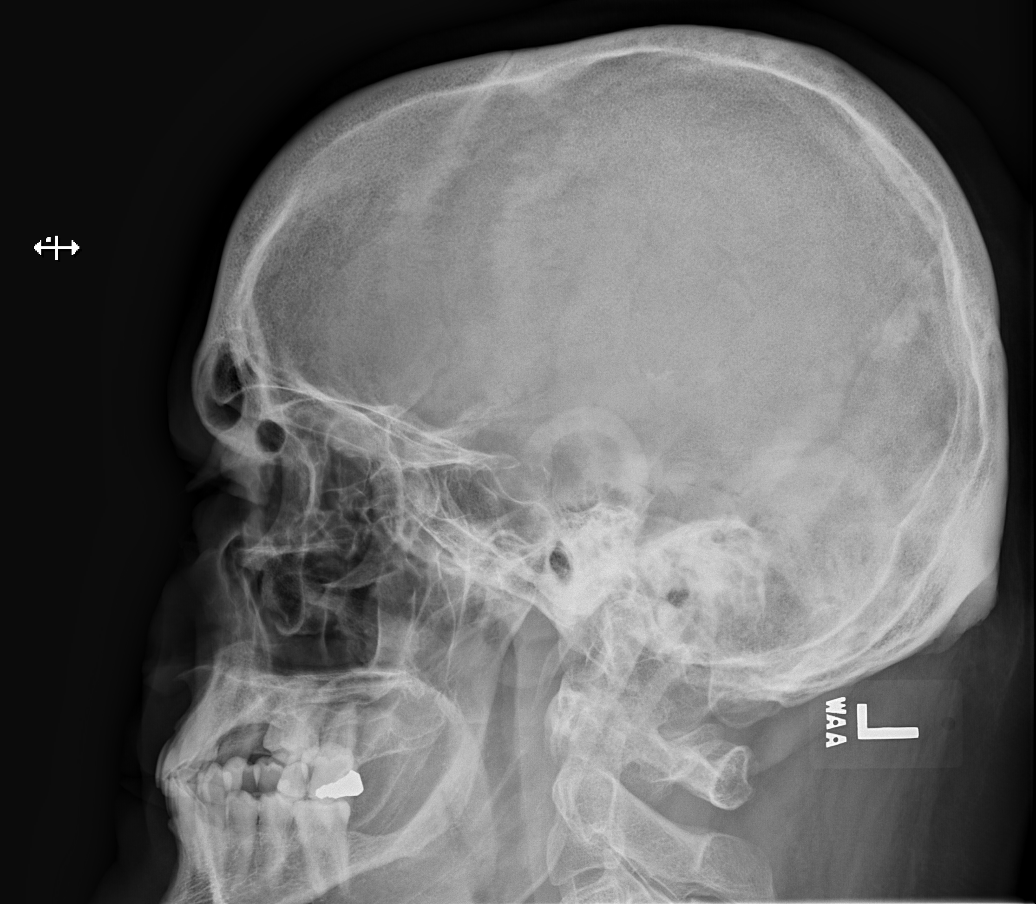
[im 4/5]
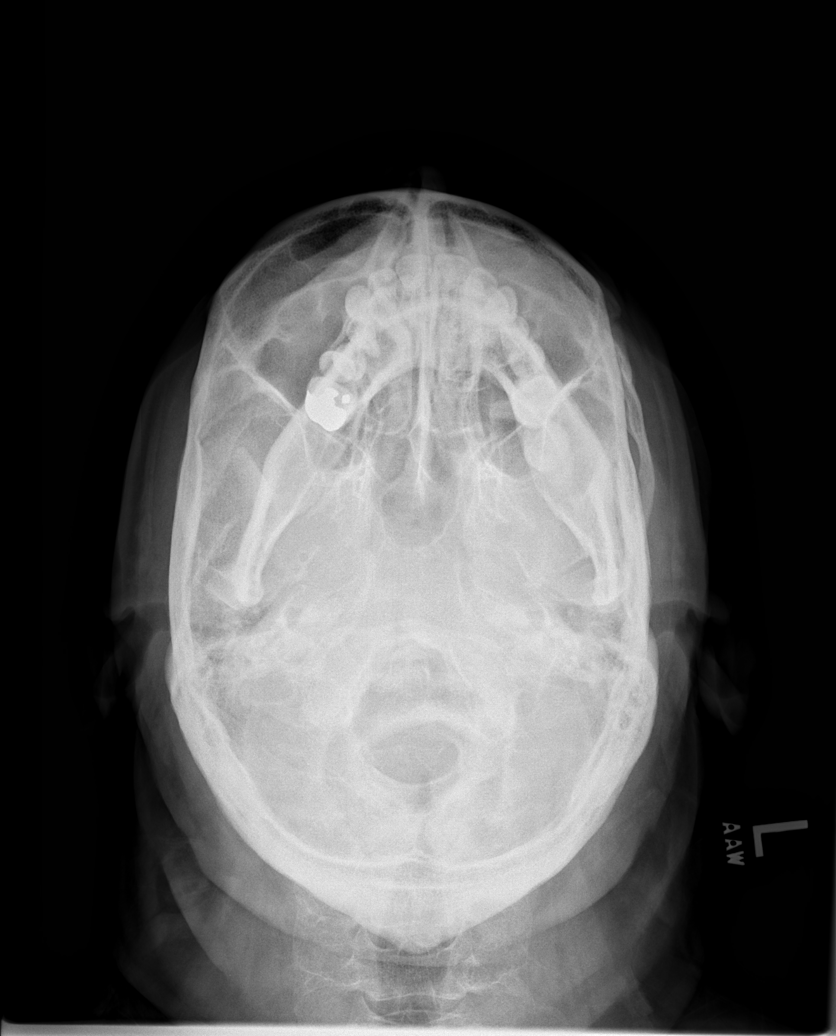
[im 5/5]
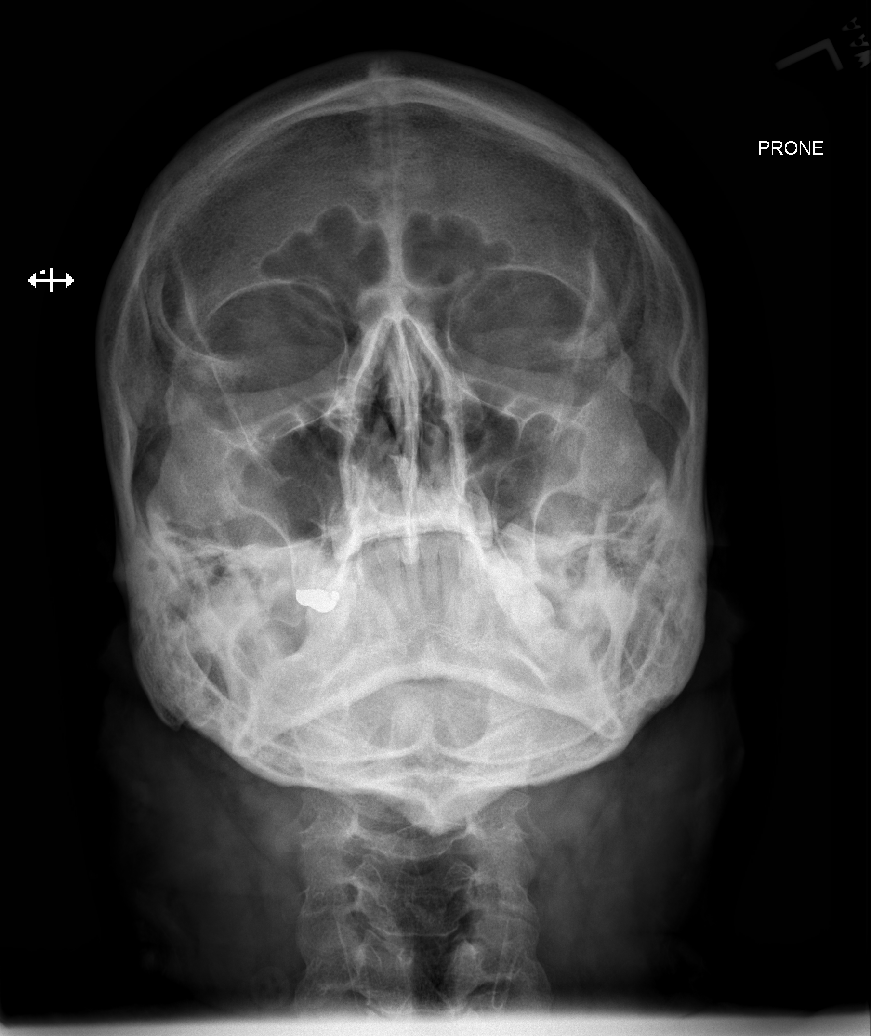

[5 of 5 positions shown; findings below may reference images not displayed]

IMPRESSION: 1. No evidence of sinusitis.

[REDACTED]

## 2014-05-11 IMAGING — CT CT ABD-PELV W/ CM
1 of 3 series · 14 of 32 positions shown, 18 images · non-contrast
Comparison: none

REASON FOR EXAM: abd pain generalized
COMMENTS:

[Series 2: abd 3mm w 3.0 i40f 3 · axial · 0.98mm/px · z∈[-1123,-652]mm · 14 of 173 slices shown, 18 images]
[im 8/173  soft-tissue]
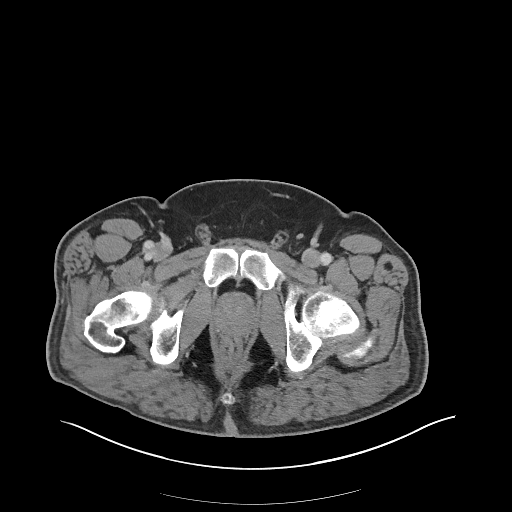
[im 8/173  bone]
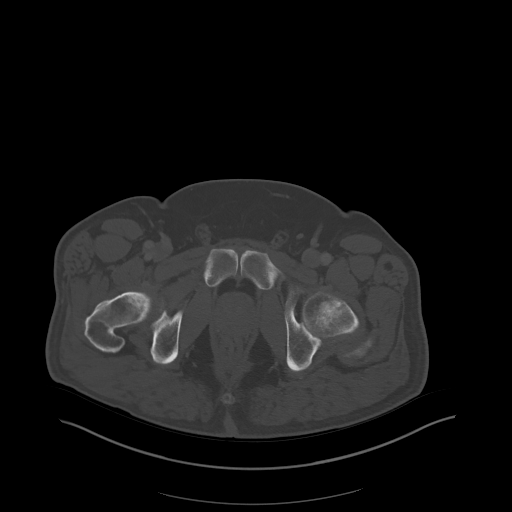
[im 23/173  soft-tissue]
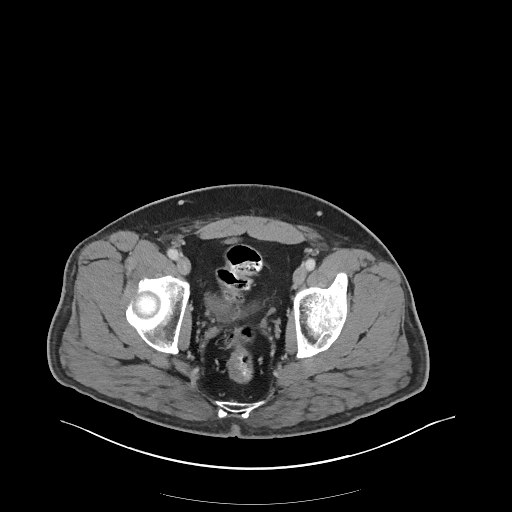
[im 38/173  soft-tissue]
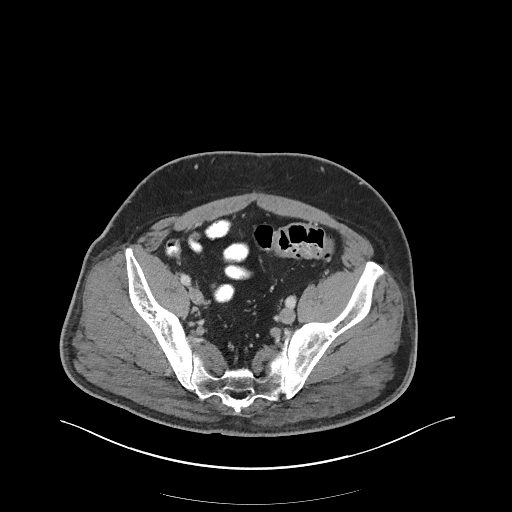
[im 53/173  soft-tissue]
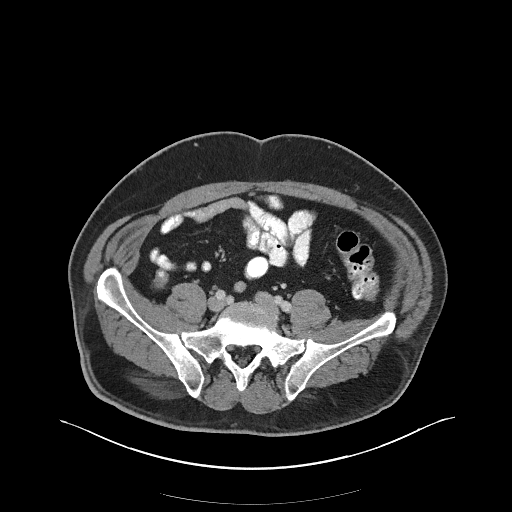
[im 68/173  soft-tissue]
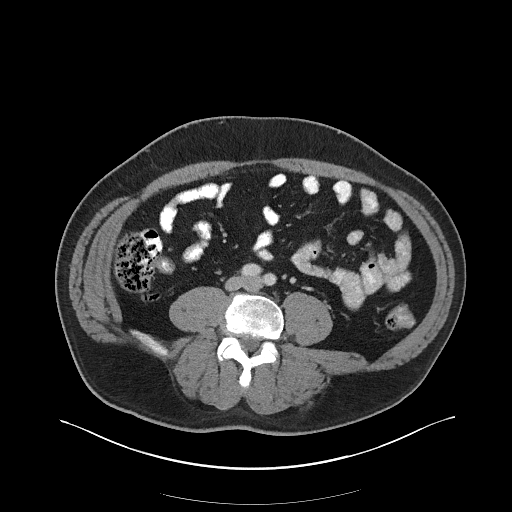
[im 83/173  soft-tissue]
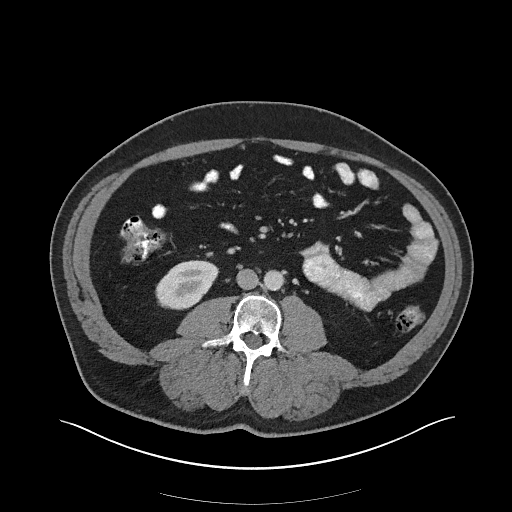
[im 90/173  soft-tissue]
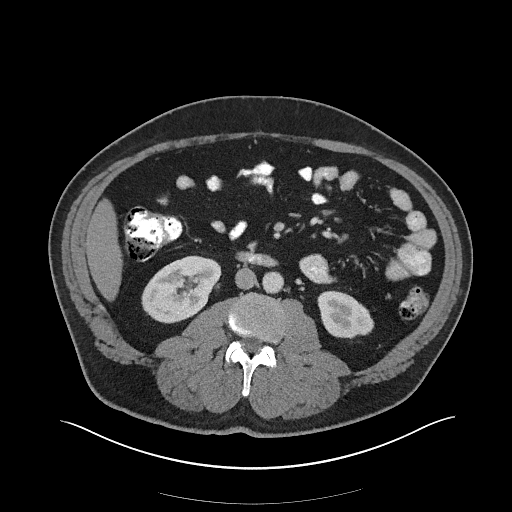
[im 105/173  soft-tissue]
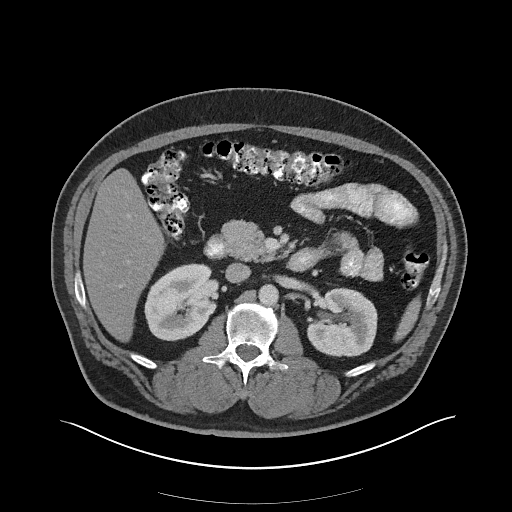
[im 120/173  soft-tissue]
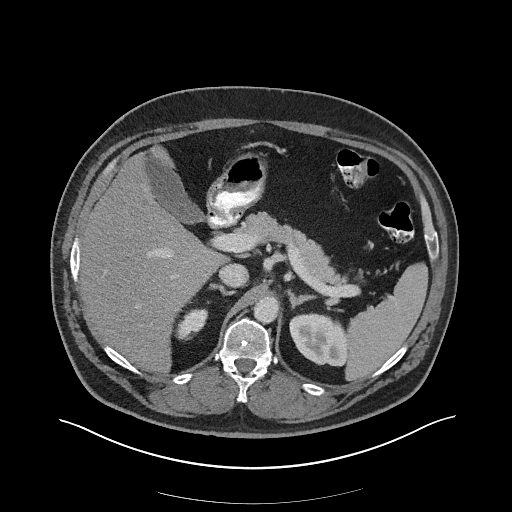
[im 120/173  bone]
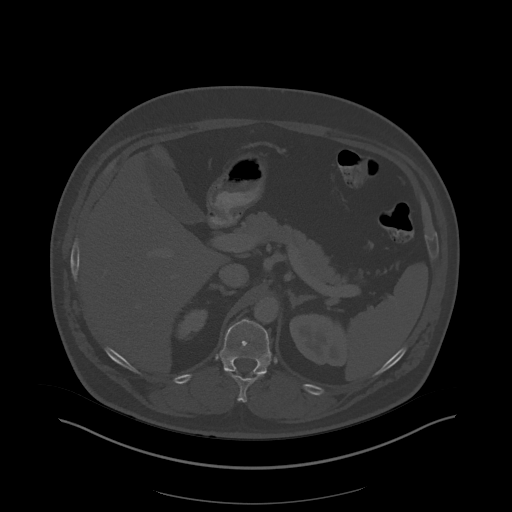
[im 135/173  soft-tissue]
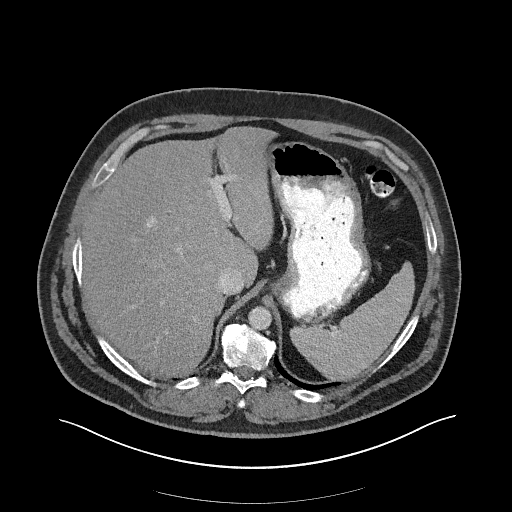
[im 143/173  lung]
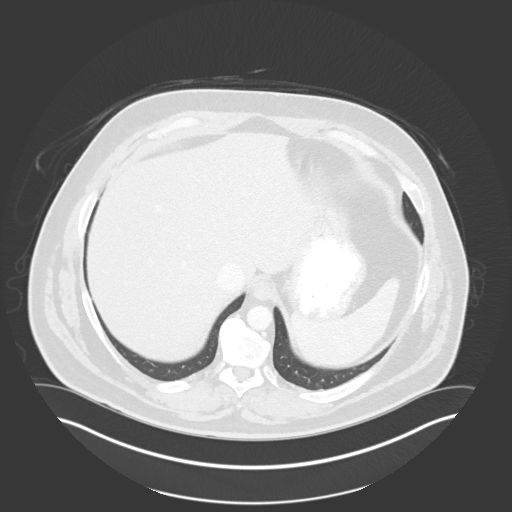
[im 150/173  soft-tissue]
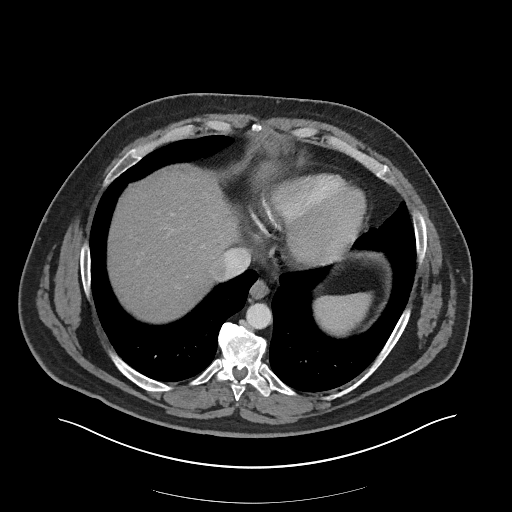
[im 150/173  lung]
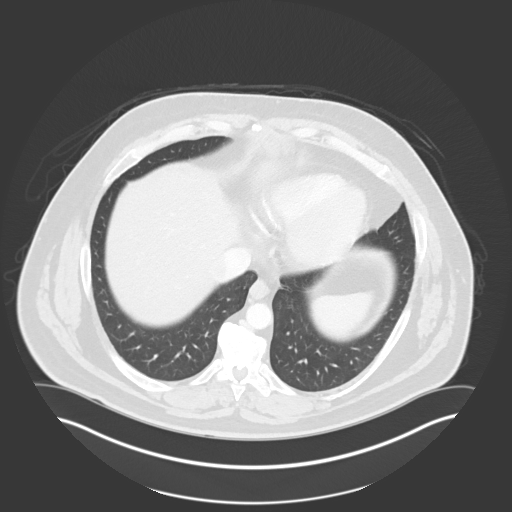
[im 158/173  lung]
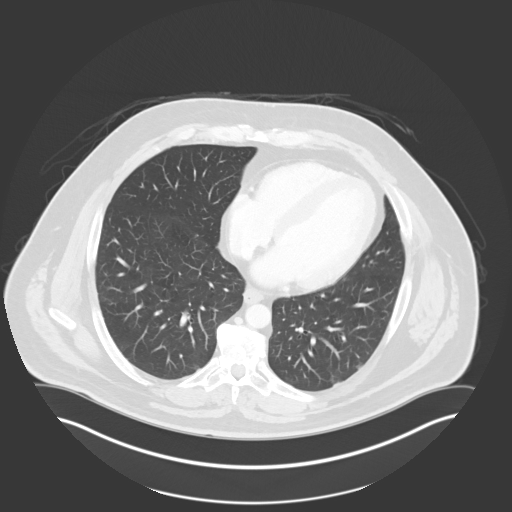
[im 165/173  soft-tissue]
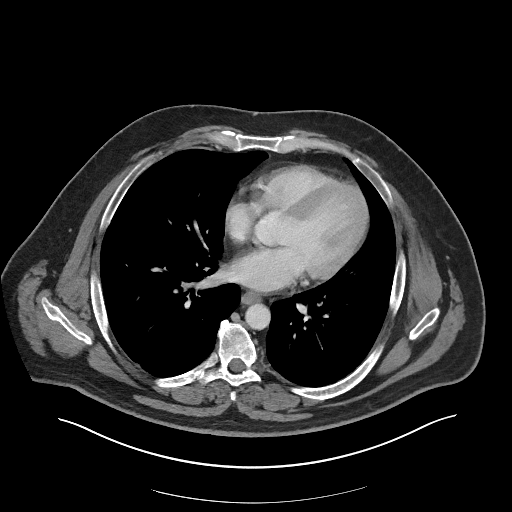
[im 165/173  lung]
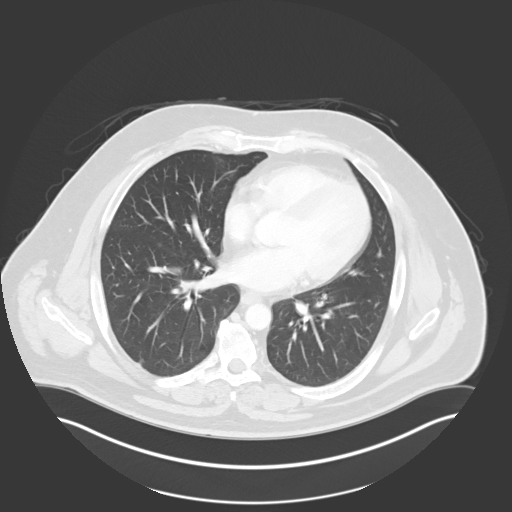

[14 of 32 positions shown; findings below may reference images not displayed]

PROCEDURE:     KCT - KCT ABDOMEN/PELVIS W  - November 17, 2012 [DATE]

RESULT:     Axial CT scanning was performed through the abdomen and pelvis
with reconstructions at 3 mm intervals and slice thicknesses. The patient
received 100 cc of Msovue-GID and also received oral contrast material.
Review of multiplanar reconstructed images was performed separately on the
VIA monitor. Comparison is made to a noncontrast study 30 October, 2010.

The liver exhibits mildly decreased density which may indicate fatty
infiltration. There is no focal mass or ductal dilation. The gallbladder,
pancreas, spleen, partially distended stomach, adrenal glands, and kidneys
exhibit no acute abnormalities. There is an approximately 1 cm diameter
hypodensity in the mid to lower pole of the right kidney posteriorly which
is stable. It exhibits Hounsfield measurement of 13 and is most compatible
with a cyst. The caliber of the abdominal aorta is normal. There is no
periaortic nor pericaval lymphadenopathy.

The partially contrast-filled loops of small and large bowel exhibit no
evidence of ileus nor of obstruction. A normal calibered gas-filled
uninflamed appearing appendix is demonstrated on images 93 through 112. The
partially distended urinary bladder is normal in appearance. The prostate
gland and seminal vesicles are normal in appearance. There is no inguinal
nor umbilical hernia. No inguinal or pelvic sidewall lymphadenopathy is
demonstrated. The psoas musculature is normal in appearance.

The lung bases are clear. The lumbar vertebral bodies are preserved in
height.
IMPRESSION: 1. There is no evidence of acute appendicitis nor other acute inflammatory
change of the small or large bowel. There is no significant diverticulosis.
2. There is no evidence of pyelonephritis nor other acute abnormality of the
urinary tracts.
3. Mildly decreased density within the liver may reflect fatty infiltration.
There is no evidence of acute hepatobiliary abnormality.
4. There is no evidence of ascites nor intra-abdominal mass or
lymphadenopathy or abscess.

[REDACTED]

## 2014-05-13 IMAGING — US US ABDOMEN LIMITED SLG ORGAN/ASCITES
1 series · 14 of 25 positions shown · non-contrast
Comparison: none

REASON FOR EXAM: gallbladder  RUQ abd pain
COMMENTS:

[Series 1: us abdomen limited slg organ/ascites · 0.35mm/px · 14 of 59 slices shown]
[im 1/59]
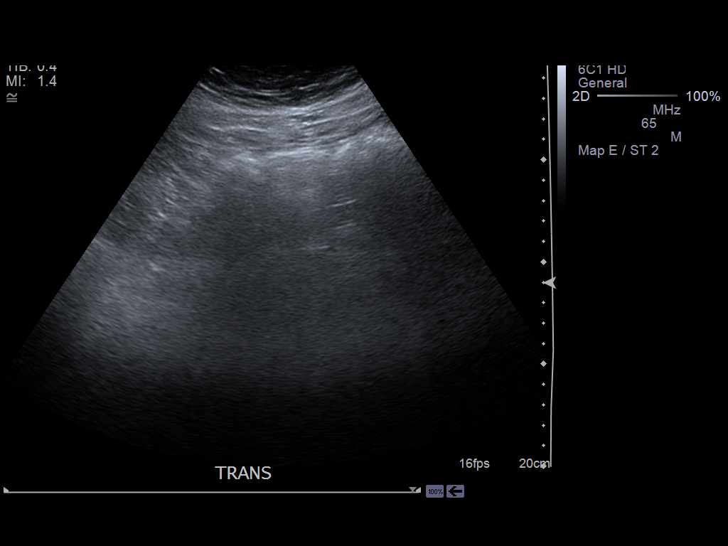
[im 5/59]
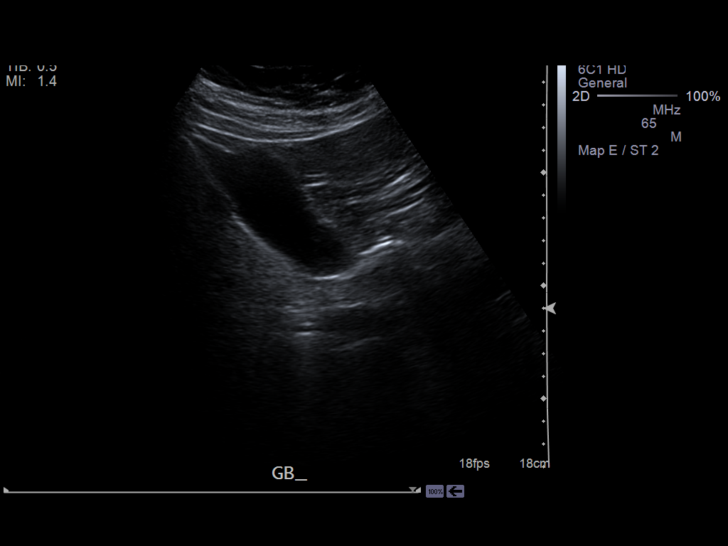
[im 10/59]
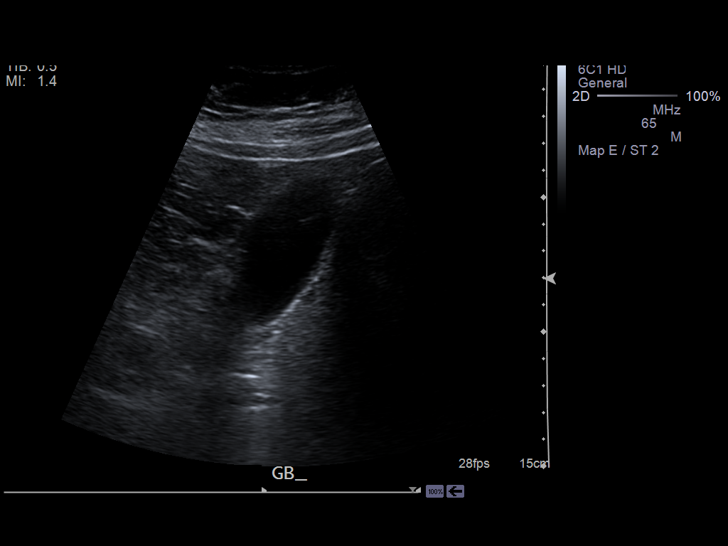
[im 15/59]
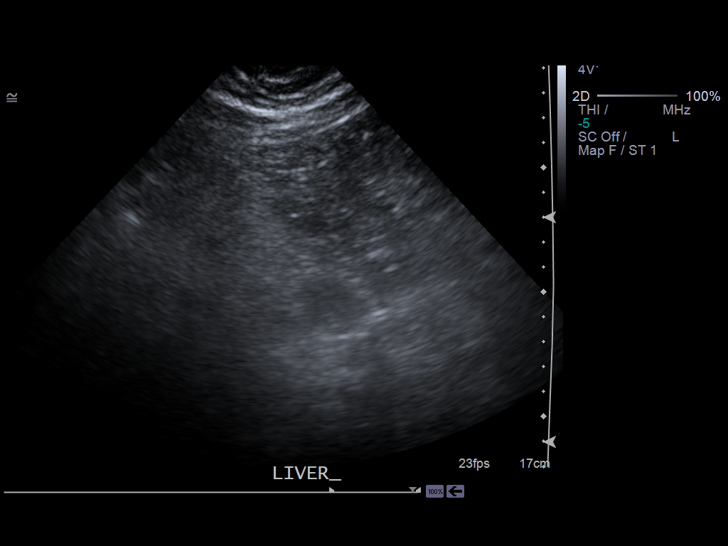
[im 20/59]
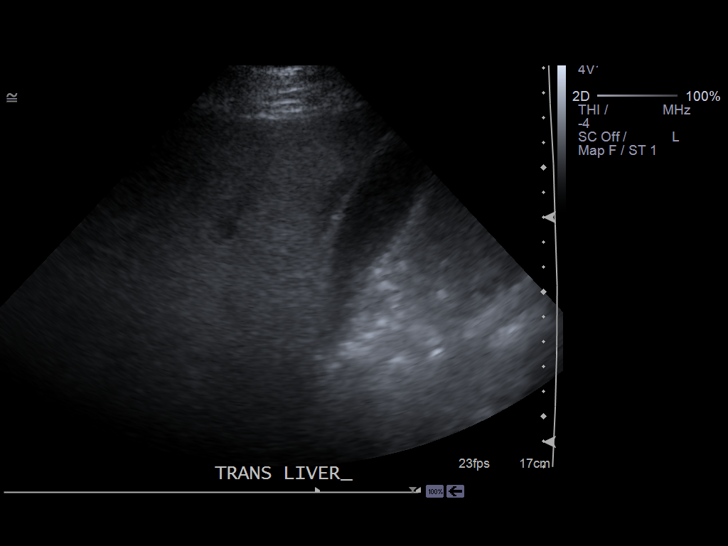
[im 22/59]
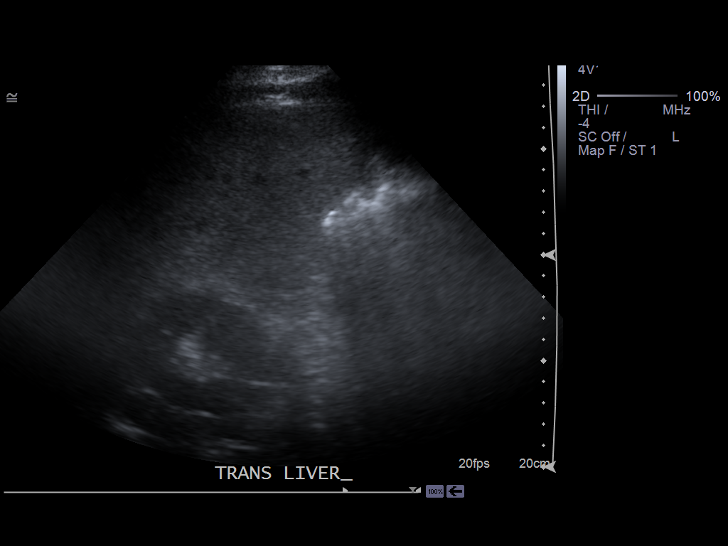
[im 27/59]
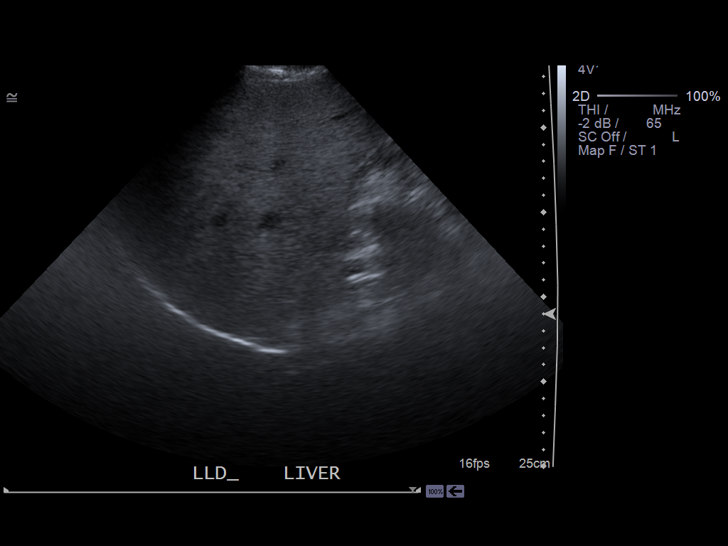
[im 32/59]
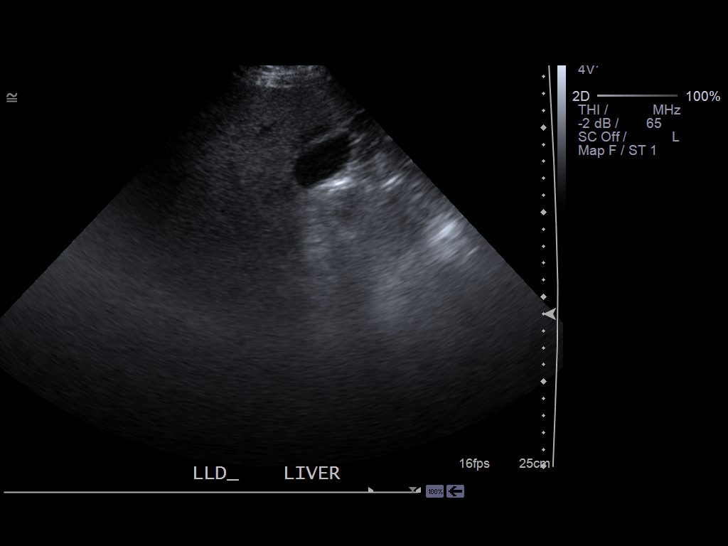
[im 37/59]
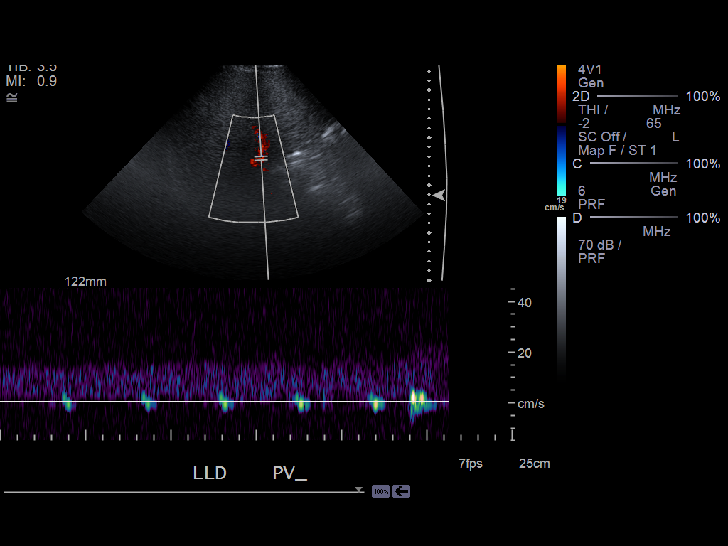
[im 39/59]
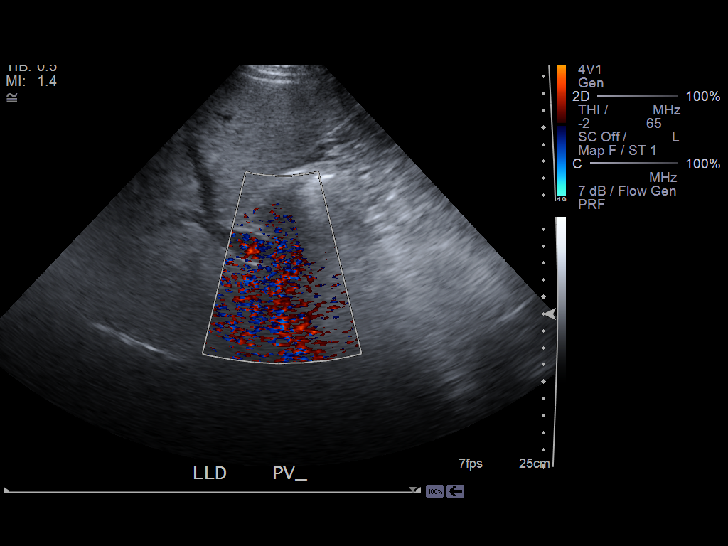
[im 44/59]
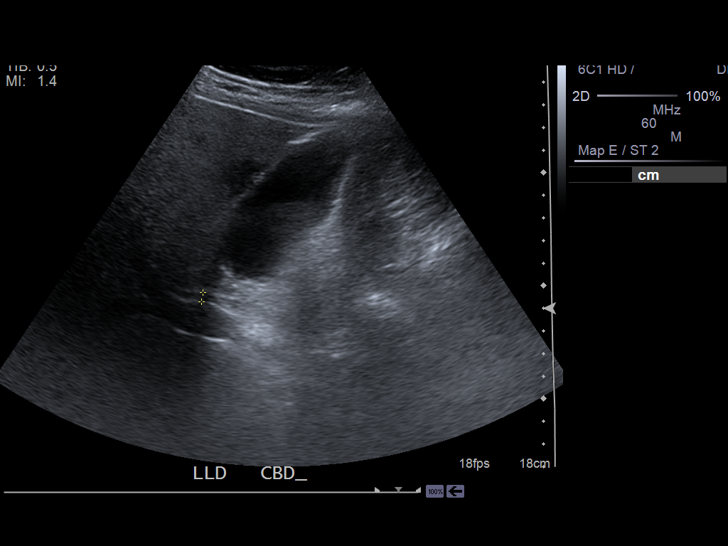
[im 49/59]
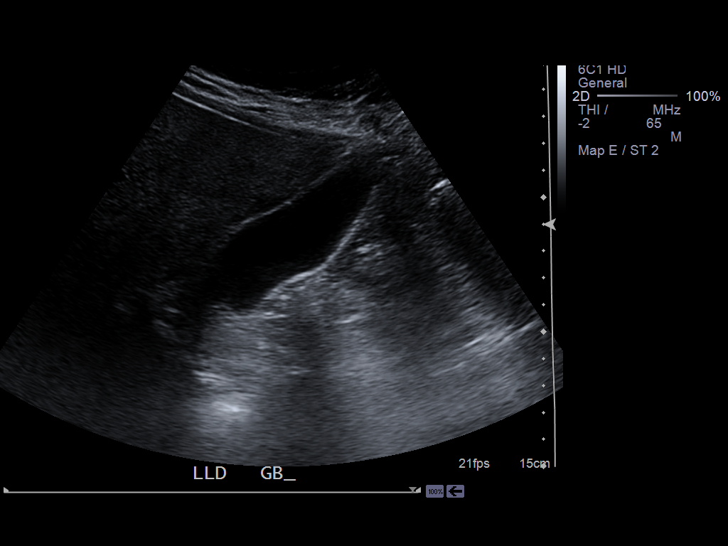
[im 54/59]
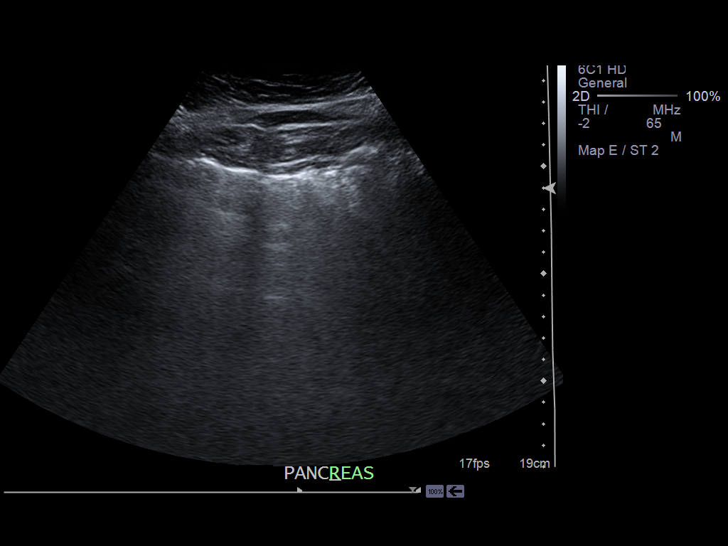
[im 59/59]
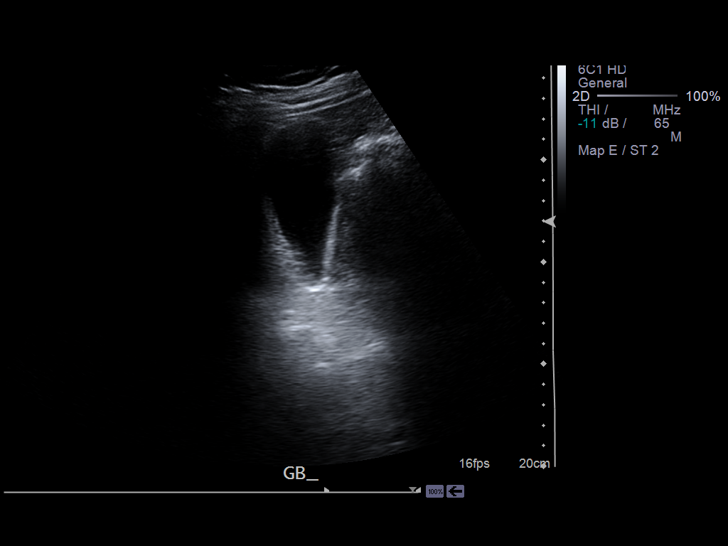

[14 of 25 positions shown; findings below may reference images not displayed]

PROCEDURE:     JIM - JIM ABDOMEN LTD 1 ORGAN OR QUAD  - November 19, 2012  [DATE]

RESULT:     A limited right upper quadrant ultrasound examination was
performed.

The liver exhibits increased echotexture diffusely consistent with fatty
infiltration. Portal venous flow is normal in direction toward the liver.
The pancreas is obscured by bowel gas. The gallbladder is adequately
distended with no evidence of stones, wall thickening, or pericholecystic
fluid. There is no positive sonographic Murphy's sign. The common bile duct
measures 5.1 mm in diameter.
IMPRESSION: 1. The echotexture of the liver is markedly increased suggesting fatty
infiltration. No definite hepatic mass is demonstrated.
2. The gallbladder exhibits no evidence of stones or acute inflammatory
change. The common bile duct is normal in caliber.
3. The pancreas was obscured by bowel gas.

[REDACTED]

## 2014-08-19 DIAGNOSIS — I219 Acute myocardial infarction, unspecified: Secondary | ICD-10-CM

## 2014-08-19 HISTORY — DX: Acute myocardial infarction, unspecified: I21.9

## 2014-12-09 NOTE — Consult Note (Signed)
PATIENT NAME:  Gabriel [name], Gabriel [name] MR#:  [id number] DATE OF BIRTH:  21-Dec-1970  DATE OF CONSULTATION:  11/12/2012  CONSULTING PHYSICIAN:  Rodena Goldmann III, MD  PRIMARY CARE PHYSICIAN: Valentino Saxon, MD  CHIEF COMPLAINT: Abdominal pain, nausea, vomiting and diarrhea.   BRIEF HISTORY: The patient is a 44 year old gentleman seen in the Emergency Room with a several day history of abdominal pain, nausea, intermittent vomiting and profound diarrhea. He was seen by his primary care doctor today for significant sinus symptoms and on clinical examination was noted to have some right lower quadrant rebound. The patient was then referred for surgical evaluation of possible acute appendicitis.   The patient has noted abdominal pain, some mild nausea, intermittent dry heaves, some vomiting, but significant diarrhea over the past week. He has noticed some blood in his stool on occasion. He has had a previous history of pancreatitis, admitted to the hospital 2012. Work-up at that time did not reveal any evidence for biliary tract disease and there was no obvious source for his pancreatitis. He has had no further work-up recently. He says this particular set of symptoms is different from his pancreatitis discomfort and he thought his symptoms currently were related to a possible gastrointestinal bug. He denies any fever or chills. 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 Looking back over 3 years, it has been as low as 1.3 as high as 1.7 today. His other liver function studies are not significantly abnormal. He did have an elevated lipase at the time of his last evaluation in 2012, but it is not currently elevated at the present time. He has no history of biliary tract disease that he is aware of, diverticulitis or peptic ulcer disease. He does have history of nephrolithiasis, hypertension, hyperlipidemia, with hypertriglyceridemia. He also has history  of type 2 diabetes, currently on therapy.   FAMILY HISTORY: Positive for that problem. He is a reformed cigarette smoker, does not drink alcohol regularly at the present time.   CURRENT MEDICATIONS: Include:  Amlodipine 10 mg p.o. daily, aspirin 81 mg p.o. daily, benazepril 40 mg p.o. once a day, citalopram 30 mg once a day, Enbrel 50 mg/mL 1 mL once a week, fluconazole 50 mcg inhaler 2 sprays once a day as needed, gemfibrozil  600 mg 2 times a day, hydrochlorothiazide 25 mg once a day, metformin 1000 mg tablets twice a day, metoprolol 100 mg twice a day and Victoza  18 mcg injection 1.2 total milligrams once a day.   ALLERGIES: HE IS ALLERGIC TO PREDNISONE AND MORPHINE.   PHYSICAL EXAMINATION: GENERAL: He is lying comfortably in bed.  VITAL SIGNS: Blood pressure 170/85, heart rate 68 and regular, oxygen saturation is normal and he is afebrile.  HEENT: No scleral icterus.  No facial deformity. No pupillary abnormalities.  NECK: Nontender. Supple with no adenopathy. Midline trachea.  CHEST: Clear bilaterally with no adventitious sounds. He has normal pulmonary excursion with no abdominal pain.  CARDIAC: No murmurs or gallops to my ear. He seems to be in normal sinus rhythm.  ABDOMEN: Soft, nondistended with no particular tenderness. He does have some mild generalized right lower quadrant tenderness. He has no guarding or rebound to my examination. He has no suprapubic tenderness. He does have some mild right flank tenderness. He has no masses or hernias noted.  EXTREMITIES: Lower extremity exam reveals full range of motion. No deformities.  PSYCHIATRIC:  He does not have any  obvious psychological problems. He has normal affect and normal orientation.   LABORATORY DATA: Laboratory values in Emergency Room reveal slightly elevated bilirubin at 01.7. The remainder of his liver function studies was unremarkable. Glucose is 111. Hemoglobin is 17.5, white blood cell count is 10,600. Lipase is 187.    IMPRESSION: This gentleman does not appear to present with an acute abdominal problem. The majority of symptoms related to his diarrhea. He certainly does not present as possible appendicitis. I told we would treat him symptomatically at the present time, set up an outpatient evaluation for his elevated bilirubin and possibly repeat his CT scan last performed 2 years ago and intervening a more urgently if his symptoms change or persist. He is in agreement with this plan. We discharged him home on oxycodone 50 mg every12 hours, Phenergan 25 mg orally every 6 hours as needed and Imodium 2 mg orally every 6 hours as needed.    FINAL DISCHARGE DIAGNOSIS: Abdominal pain, diarrhea.       ____________________________ Micheline Maze, MD rle:cc D: 11/12/2012 18:38:38 ET T: 11/12/2012 22:22:16 ET JOB#: [id number]  cc: Micheline Maze, MD, <Dictator> Maureen P. Heber [place], MD Rodena Goldmann MD ELECTRONICALLY SIGNED 11/13/2012 13:14

## 2015-02-13 ENCOUNTER — Emergency Department
Admission: EM | Admit: 2015-02-13 | Discharge: 2015-02-13 | Disposition: A | Discharge status: Home / Self Care | Payer: Self-pay | Attending: Student | Admitting: Student

## 2015-02-13 ENCOUNTER — Encounter: Payer: Self-pay | Admitting: *Deleted

## 2015-02-13 DIAGNOSIS — S76219A Strain of adductor muscle, fascia and tendon of unspecified thigh, initial encounter: Secondary | ICD-10-CM

## 2015-02-13 DIAGNOSIS — Y9289 Other specified places as the place of occurrence of the external cause: Secondary | ICD-10-CM | POA: Insufficient documentation

## 2015-02-13 DIAGNOSIS — Z720 Tobacco use: Secondary | ICD-10-CM | POA: Insufficient documentation

## 2015-02-13 DIAGNOSIS — Y9389 Activity, other specified: Secondary | ICD-10-CM | POA: Insufficient documentation

## 2015-02-13 DIAGNOSIS — X58XXXA Exposure to other specified factors, initial encounter: Secondary | ICD-10-CM | POA: Insufficient documentation

## 2015-02-13 DIAGNOSIS — S39011A Strain of muscle, fascia and tendon of abdomen, initial encounter: Secondary | ICD-10-CM

## 2015-02-13 DIAGNOSIS — Y998 Other external cause status: Secondary | ICD-10-CM | POA: Insufficient documentation

## 2015-02-13 DIAGNOSIS — S76911A Strain of unspecified muscles, fascia and tendons at thigh level, right thigh, initial encounter: Secondary | ICD-10-CM | POA: Insufficient documentation

## 2015-02-13 MED ORDER — NAPROXEN 500 MG PO TABS
500.0000 mg | ORAL_TABLET | Freq: Two times a day (BID) | ORAL | Status: DC
Start: 2015-02-13 — End: 2015-03-08

## 2015-02-13 MED ORDER — TRAMADOL HCL 50 MG PO TABS: 50.0000 mg | ORAL_TABLET | Freq: Four times a day (QID) | ORAL | Status: DC | PRN

## 2015-02-13 NOTE — ED Provider Notes (Signed)
[facility] Emergency Department Provider Note  ____________________________________________  Time seen: Approximately 10:10 AM  I have reviewed the triage vital signs and the nursing notes.   HISTORY  Chief Complaint Groin Pain    HPI Gabriel Yett. is a 44 y.o. male patient complaining of 1 week of bilateral groin pain.Today he stated that the complaint is only on the right side. She has a history of repetitive heavy lifting. Patient denies any bladder or bowel dysfunction. Patient is rating his pain as a 10 over 10. Patient described the pain as dull to sharp. Pain increases with any heavy lifting. Pain seems to decrease with rest. Patient taken over-the-counter anti-inflammatory disease which gives him some transient relief. Patient denies any palpable mass.  History reviewed. No pertinent past medical history.  There are no active problems to display for this patient.   Past Surgical History  Procedure Laterality Date  . Amputation      No current outpatient prescriptions on file.  Allergies Review of patient's allergies indicates no known allergies.  No family history on file.  Social History History  Substance Use Topics  . Smoking status: Current Every Day Smoker  . Smokeless tobacco: Not on file  . Alcohol Use: Yes    Review of Systems Constitutional: No fever/chills Eyes: No visual changes. ENT: No sore throat. Cardiovascular: Denies chest pain. Respiratory: Denies shortness of breath. Gastrointestinal: No abdominal pain.  No nausea, no vomiting.  No diarrhea.  No constipation. Genitourinary: Negative for dysuria. Musculoskeletal: Right groin pain  Skin: Negative for rash. Neurological: Negative for headaches, focal weakness or numbness. 10-point ROS otherwise negative.  ____________________________________________   PHYSICAL EXAM:  VITAL SIGNS: ED Triage Vitals  Enc Vitals Group     BP 02/13/15 0903 159/68 mmHg    Pulse Rate 02/13/15 0903 86     Resp 02/13/15 0903 18     Temp 02/13/15 0903 98 F (36.7 C)     Temp Source 02/13/15 0903 Oral     SpO2 02/13/15 0903 100 %     Weight 02/13/15 0903 225 lb (102.059 kg)     Height 02/13/15 0903 6' (1.829 m)     Head Cir --      Peak Flow --      Pain Score 02/13/15 0903 10     Pain Loc --      Pain Edu? --      Excl. in [place]? --    Constitutional: Alert and oriented. Well appearing and in no acute distress. Eyes: Conjunctivae are normal. PERRL. EOMI. Head: Atraumatic. Nose: No congestion/rhinnorhea. Mouth/Throat: Mucous membranes are moist.  Oropharynx non-erythematous. Neck: No stridor.  No cervical spine tenderness to palpation. Hematological/Lymphatic/Immunilogical: No cervical lymphadenopathy. Cardiovascular: Normal rate, regular rhythm. Grossly normal heart sounds.  Good peripheral circulation. Respiratory: Normal respiratory effort.  No retractions. Lungs CTAB. Gastrointestinal: Soft and nontender. No distention. No abdominal bruits. No CVA tenderness. Musculoskeletal: The palpation right angle area with no palpable masses.  Neurologic:  Normal speech and language. No gross focal neurologic deficits are appreciated. Speech is normal. No gait instability. Skin:  Skin is warm, dry and intact. No rash noted. Psychiatric: Mood and affect are normal. Speech and behavior are normal.  ____________________________________________   LABS (all labs ordered are listed, but only abnormal results are displayed)  Labs Reviewed - No data to display ____________________________________________  EKG   ____________________________________________  RADIOLOGY   ____________________________________________   PROCEDURES  Procedure(s) performed: None  Critical Care  performed: No  ____________________________________________   INITIAL IMPRESSION / ASSESSMENT AND PLAN / ED COURSE  Pertinent labs & imaging results that were available during my  care of the patient were reviewed by me and considered in my medical decision making (see chart for details).  Right inguinal strain. Patient advised on palliative supportive care. Patient will receive a prescription for naproxen. Patient was seen a work restriction note for 10 days. Patient advised follow-up with family doctor for reevaluation. Patient advised return to ER if he knows any masses or increased pain. ____________________________________________   FINAL CLINICAL IMPRESSION(S) / ED DIAGNOSES  Final diagnoses:  Inguinal strain, initial encounter      Sable Feil, PA-C 02/13/15 1022  Joanne Gavel, MD 02/13/15 [phone]

## 2015-02-13 NOTE — ED Notes (Signed)
Right groin pain past few days,worse with movement,

## 2015-02-13 NOTE — ED Notes (Signed)
Having pain to bilateral groin area since last week. 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 Pain eases with rest

## 2015-03-08 ENCOUNTER — Emergency Department
Admission: EM | Admit: 2015-03-08 | Discharge: 2015-03-08 | Disposition: A | Discharge status: Home / Self Care | Payer: Self-pay | Attending: Student | Admitting: Student

## 2015-03-08 ENCOUNTER — Other Ambulatory Visit: Payer: Self-pay

## 2015-03-08 ENCOUNTER — Emergency Department: Payer: Self-pay

## 2015-03-08 DIAGNOSIS — Y998 Other external cause status: Secondary | ICD-10-CM | POA: Insufficient documentation

## 2015-03-08 DIAGNOSIS — X58XXXA Exposure to other specified factors, initial encounter: Secondary | ICD-10-CM | POA: Insufficient documentation

## 2015-03-08 DIAGNOSIS — K649 Unspecified hemorrhoids: Secondary | ICD-10-CM | POA: Insufficient documentation

## 2015-03-08 DIAGNOSIS — Z720 Tobacco use: Secondary | ICD-10-CM | POA: Insufficient documentation

## 2015-03-08 DIAGNOSIS — S76211A Strain of adductor muscle, fascia and tendon of right thigh, initial encounter: Secondary | ICD-10-CM | POA: Insufficient documentation

## 2015-03-08 DIAGNOSIS — S39011A Strain of muscle, fascia and tendon of abdomen, initial encounter: Secondary | ICD-10-CM

## 2015-03-08 DIAGNOSIS — Y9389 Activity, other specified: Secondary | ICD-10-CM | POA: Insufficient documentation

## 2015-03-08 DIAGNOSIS — Y9289 Other specified places as the place of occurrence of the external cause: Secondary | ICD-10-CM | POA: Insufficient documentation

## 2015-03-08 DIAGNOSIS — S76219A Strain of adductor muscle, fascia and tendon of unspecified thigh, initial encounter: Secondary | ICD-10-CM

## 2015-03-08 LAB — URINALYSIS COMPLETE WITH MICROSCOPIC (ARMC ONLY)
Bacteria, UA: NONE SEEN
Bilirubin Urine: NEGATIVE
Glucose, UA: NEGATIVE mg/dL
Hgb urine dipstick: NEGATIVE
Ketones, ur: NEGATIVE mg/dL
Leukocytes, UA: NEGATIVE
Nitrite: NEGATIVE
Protein, ur: 100 mg/dL — AB
RBC / HPF: NONE SEEN RBC/hpf (ref 0–5)
Specific Gravity, Urine: 1.021 (ref 1.005–1.030)
Squamous Epithelial / LPF: NONE SEEN
pH: 5 (ref 5.0–8.0)

## 2015-03-08 LAB — COMPREHENSIVE METABOLIC PANEL
ALT: 39 U/L (ref 17–63)
AST: 29 U/L (ref 15–41)
Albumin: 3.6 g/dL (ref 3.5–5.0)
Alkaline Phosphatase: 68 U/L (ref 38–126)
Anion gap: 9 (ref 5–15)
BUN: 19 mg/dL (ref 6–20)
CO2: 27 mmol/L (ref 22–32)
Calcium: 8.9 mg/dL (ref 8.9–10.3)
Chloride: 105 mmol/L (ref 101–111)
Creatinine, Ser: 0.93 mg/dL (ref 0.61–1.24)
GFR calc Af Amer: >60 mL/min (ref >60)
GFR calc non Af Amer: >60 mL/min (ref >60)
Glucose, Bld: 138 mg/dL — ABNORMAL HIGH (ref 65–99)
Potassium: 3.8 mmol/L (ref 3.5–5.1)
Sodium: 141 mmol/L (ref 135–145)
Total Bilirubin: 1.7 mg/dL — ABNORMAL HIGH (ref 0.3–1.2)
Total Protein: 6.5 g/dL (ref 6.5–8.1)

## 2015-03-08 LAB — CBC
HCT: 48.9 % (ref 40.0–52.0)
Hemoglobin: 16 g/dL (ref 13.0–18.0)
MCH: 28.1 pg (ref 26.0–34.0)
MCHC: 32.8 g/dL (ref 32.0–36.0)
MCV: 85.7 fL (ref 80.0–100.0)
Platelets: 154 10*3/uL (ref 150–440)
RBC: 5.71 MIL/uL (ref 4.40–5.90)
RDW: 14.7 % — ABNORMAL HIGH (ref 11.5–14.5)
WBC: 11 10*3/uL — ABNORMAL HIGH (ref 3.8–10.6)

## 2015-03-08 LAB — LIPASE, BLOOD: Lipase: 45 U/L (ref 22–51)

## 2015-03-08 MED ORDER — TRAMADOL HCL 50 MG PO TABS: 50.0000 mg | ORAL_TABLET | Freq: Three times a day (TID) | ORAL | Status: DC | PRN

## 2015-03-08 MED ORDER — HYDROMORPHONE HCL 1 MG/ML IJ SOLN
1.0000 mg | Freq: Once | INTRAMUSCULAR | Status: AC
  Administered 2015-03-08: 1 mg via INTRAMUSCULAR
  Filled 2015-03-08: qty 1

## 2015-03-08 NOTE — ED Notes (Signed)
Pt unable to urinate at this time, given urine cup.

## 2015-03-08 NOTE — ED Notes (Signed)
Pt states that he has been experiencing right sided abdominal pain beginning yesterday. Reports bright red diarrhea last night that has "tapered off" since. Weakness, fatigue. Nauseated. Pt alert and oriented X4, active, cooperative, pt in NAD. RR even and unlabored, color WNL.

## 2015-03-08 NOTE — ED Provider Notes (Addendum)
[facility] Emergency Department Provider Note  ____________________________________________  Time seen: Approximately 7:52 AM  I have reviewed the triage vital signs and the nursing notes.   HISTORY  Chief Complaint Abdominal Pain and Rectal Bleeding    HPI Gabriel [name]. is a 44 y.o. male with history of hypertension resents for evaluation of one day of gradual onset right-sided abdominal pain constant since onset, moderate. Patient reports that he was using a lawnmower yesterday and he may have "pulled something". He was seen here on 02/13/2015 for the same symptoms and they were thought to be secondary to inguinal strain. He reports the pain originates in the right groin and goes all the way up into the right abdomen, as it did one month ago. Current severity is moderate. No modifying factors. Additionally he reports he had a bowel movement last night and noted some bright blood in it. This has since resolved. No hematemesis. No fever, no diarrhea.   History reviewed. No pertinent past medical history.  There are no active problems to display for this patient.   Past Surgical History  Procedure Laterality Date  . Amputation    . Finger amputation      No current outpatient prescriptions on file.  Allergies Review of patient's allergies indicates no known allergies.  No family history on file.  Social History History  Substance Use Topics  . Smoking status: Current Every Day Smoker  . Smokeless tobacco: Not on file  . Alcohol Use: Yes    Review of Systems Constitutional: No fever/chills Eyes: No visual changes. ENT: No sore throat. Cardiovascular: Denies chest pain. Respiratory: Denies shortness of breath. Gastrointestinal: + abdominal pain.  No nausea, no vomiting.  No diarrhea.  No constipation. Genitourinary: Negative for dysuria. Musculoskeletal: Negative for back pain. Skin: Negative for rash. Neurological: Negative for  headaches, focal weakness or numbness.  10-point ROS otherwise negative.  ____________________________________________   PHYSICAL EXAM:  VITAL SIGNS: ED Triage Vitals  Enc Vitals Group     BP 03/08/15 0713 161/113 mmHg     Pulse Rate 03/08/15 0713 91     Resp 03/08/15 0713 20     Temp 03/08/15 0713 98.3 F (36.8 C)     Temp Source 03/08/15 0713 Oral     SpO2 03/08/15 0713 95 %     Weight 03/08/15 0713 233 lb (105.688 kg)     Height 03/08/15 0713 6' (1.829 m)     Head Cir --      Peak Flow --      Pain Score 03/08/15 0714 10     Pain Loc --      Pain Edu? --      Excl. in [place]? --     Constitutional: Alert and oriented. Well appearing and in no acute distress. Eyes: Conjunctivae are normal. PERRL. EOMI. Head: Atraumatic. Nose: No congestion/rhinnorhea. Mouth/Throat: Mucous membranes are moist.  Oropharynx non-erythematous. Neck: No stridor.   Cardiovascular: Normal rate, regular rhythm. Grossly normal heart sounds.  Good peripheral circulation. Respiratory: Normal respiratory effort.  No retractions. Lungs CTAB. Gastrointestinal: Soft, tenderness resolves with distraction. No distention. No abdominal bruits. No CVA tenderness. Genitourinary: no inguinal hernia, nontender descended testicles bilaterally Rectal: Multiple nonthrombosed nontender external hemorrhoids, brown stool in the rectal vault is guaiac negative. Musculoskeletal: No lower extremity tenderness nor edema.  No joint effusions. Neurologic:  Normal speech and language. No gross focal neurologic deficits are appreciated. No gait instability. Skin:  Skin is warm, dry and  intact. No rash noted. Psychiatric: Mood and affect are normal. Speech and behavior are normal.  ____________________________________________   LABS (all labs ordered are listed, but only abnormal results are displayed)  Labs Reviewed  COMPREHENSIVE METABOLIC PANEL - Abnormal; Notable for the following:    Glucose, Bld 138 (*)    Total  Bilirubin 1.7 (*)    All other components within normal limits  CBC - Abnormal; Notable for the following:    WBC 11.0 (*)    RDW 14.7 (*)    All other components within normal limits  URINALYSIS COMPLETEWITH MICROSCOPIC ([facility] ONLY) - Abnormal; Notable for the following:    Color, Urine YELLOW (*)    APPearance CLEAR (*)    Protein, ur 100 (*)    All other components within normal limits  LIPASE, BLOOD   ____________________________________________  EKG  ED ECG REPORT I, Joanne Gavel, the attending physician, personally viewed and interpreted this ECG.   Date: 03/08/2015  EKG Time: 07:17  Rate: 85  Rhythm: normal sinus rhythm  Axis: normal  Intervals:normal  ST&T Change: No acute ST segment elevation, no T wave inversion  ____________________________________________  RADIOLOGY  KUB IMPRESSION: 1. Normal bowel gas pattern, no free air. 2. No urologic calculus identified.IMPRESSION:  ____________________________________________   PROCEDURES  Procedure(s) performed: None  Critical Care performed: No  ____________________________________________   INITIAL IMPRESSION / ASSESSMENT AND PLAN / ED COURSE  Pertinent labs & imaging results that were available during my care of the patient were reviewed by me and considered in my medical decision making (see chart for details).  Gabriel [name]. is a 45 y.o. male with history of hypertension resents for evaluation of one day of gradual onset right-sided abdominal pain constant since onset, moderate. On exam, he is jelly well-appearing and in no acute distress. Vital signs stable, he is afebrile. Multiple external hemorrhoids and I suspect this is the cause of his blood in stool. His hemoglobin is 16, stool is brown and guaiac-negative, no concern for continued/active bleeding or serious acute GI bleed. He has chronic mild elevation in the total bili on chart review, this is not increased from baseline. Her mild  leukocytosis at 11.0 which I suspect is catecholamine/stress induced. Suspect recurrent inguinal strain as the most likely cause of his right-sided abdominal pain given similar symptoms last month in the setting of strain. We'll obtain upright KUB, treat his pain, reassess for disposition.  ----------------------------------------- 9:15 AM on 03/08/2015 -----------------------------------------  Anticoagulation with significant improvement in his pain. Repeat abdominal exam shows no tenderness and no rebound, no guarding, normal bowel sounds. Doubt any acute gallbladder pathology, doubt appendicitis. Plain films negative for free air or obvious urologic calculus. Discussed meticulous return precautions, treatment with Ultram, need for close PCP follow-up given his asymptomatic hypertension. He is comfortable with the discharge plan.   ____________________________________________   FINAL CLINICAL IMPRESSION(S) / ED DIAGNOSES  Final diagnoses:  Inguinal strain, initial encounter  Hemorrhoids, unspecified hemorrhoid type      Joanne Gavel, MD 03/08/15 2376  Joanne Gavel, MD 03/08/15 [phone]

## 2015-03-20 ENCOUNTER — Encounter: Payer: Self-pay | Admitting: Emergency Medicine

## 2015-03-20 ENCOUNTER — Observation Stay
Admit: 2015-03-20 | Discharge: 2015-03-20 | Disposition: A | Discharge status: Home / Self Care | Payer: Self-pay | Attending: Physician Assistant | Admitting: Physician Assistant

## 2015-03-20 ENCOUNTER — Inpatient Hospital Stay
Admission: EM | Admit: 2015-03-20 | Discharge: 2015-03-21 | DRG: 292 | Disposition: A | Discharge status: Home / Self Care | Payer: Self-pay | Attending: Internal Medicine | Admitting: Internal Medicine

## 2015-03-20 ENCOUNTER — Emergency Department: Payer: Self-pay

## 2015-03-20 DIAGNOSIS — I248 Other forms of acute ischemic heart disease: Secondary | ICD-10-CM | POA: Diagnosis present

## 2015-03-20 DIAGNOSIS — E119 Type 2 diabetes mellitus without complications: Secondary | ICD-10-CM | POA: Diagnosis present

## 2015-03-20 DIAGNOSIS — R778 Other specified abnormalities of plasma proteins: Secondary | ICD-10-CM

## 2015-03-20 DIAGNOSIS — Z89029 Acquired absence of unspecified finger(s): Secondary | ICD-10-CM

## 2015-03-20 DIAGNOSIS — I509 Heart failure, unspecified: Secondary | ICD-10-CM

## 2015-03-20 DIAGNOSIS — F17210 Nicotine dependence, cigarettes, uncomplicated: Secondary | ICD-10-CM | POA: Diagnosis present

## 2015-03-20 DIAGNOSIS — I5033 Acute on chronic diastolic (congestive) heart failure: Secondary | ICD-10-CM

## 2015-03-20 DIAGNOSIS — Z79899 Other long term (current) drug therapy: Secondary | ICD-10-CM

## 2015-03-20 DIAGNOSIS — Z888 Allergy status to other drugs, medicaments and biological substances status: Secondary | ICD-10-CM

## 2015-03-20 DIAGNOSIS — I5031 Acute diastolic (congestive) heart failure: Principal | ICD-10-CM | POA: Diagnosis present

## 2015-03-20 DIAGNOSIS — Z6830 Body mass index (BMI) 30.0-30.9, adult: Secondary | ICD-10-CM

## 2015-03-20 DIAGNOSIS — R7989 Other specified abnormal findings of blood chemistry: Secondary | ICD-10-CM | POA: Diagnosis present

## 2015-03-20 DIAGNOSIS — R0602 Shortness of breath: Secondary | ICD-10-CM

## 2015-03-20 DIAGNOSIS — R079 Chest pain, unspecified: Secondary | ICD-10-CM | POA: Diagnosis present

## 2015-03-20 DIAGNOSIS — E669 Obesity, unspecified: Secondary | ICD-10-CM | POA: Diagnosis present

## 2015-03-20 HISTORY — DX: Essential (primary) hypertension: I10

## 2015-03-20 HISTORY — DX: Type 2 diabetes mellitus without complications: E11.9

## 2015-03-20 HISTORY — DX: Obesity, unspecified: E66.9

## 2015-03-20 HISTORY — DX: Psoriasis, unspecified: L40.9

## 2015-03-20 HISTORY — DX: Obesity, class 1: E66.811

## 2015-03-20 LAB — BASIC METABOLIC PANEL
Anion gap: 7 (ref 5–15)
BUN: 19 mg/dL (ref 6–20)
CO2: 25 mmol/L (ref 22–32)
Calcium: 8.6 mg/dL — ABNORMAL LOW (ref 8.9–10.3)
Chloride: 107 mmol/L (ref 101–111)
Creatinine, Ser: 0.93 mg/dL (ref 0.61–1.24)
GFR calc Af Amer: >60 mL/min (ref >60)
GFR calc non Af Amer: >60 mL/min (ref >60)
Glucose, Bld: 123 mg/dL — ABNORMAL HIGH (ref 65–99)
Potassium: 3.7 mmol/L (ref 3.5–5.1)
Sodium: 139 mmol/L (ref 135–145)

## 2015-03-20 LAB — TROPONIN I
Troponin I: 0.07 ng/mL — ABNORMAL HIGH (ref <0.031)
Troponin I: 0.07 ng/mL — ABNORMAL HIGH (ref <0.031)
Troponin I: 0.05 ng/mL — ABNORMAL HIGH (ref <0.031)

## 2015-03-20 LAB — CBC
HCT: 47.6 % (ref 40.0–52.0)
Hemoglobin: 15.8 g/dL (ref 13.0–18.0)
MCH: 27.8 pg (ref 26.0–34.0)
MCHC: 33.2 g/dL (ref 32.0–36.0)
MCV: 83.9 fL (ref 80.0–100.0)
Platelets: 154 10*3/uL (ref 150–440)
RBC: 5.67 MIL/uL (ref 4.40–5.90)
RDW: 14.2 % (ref 11.5–14.5)
WBC: 12 10*3/uL — ABNORMAL HIGH (ref 3.8–10.6)

## 2015-03-20 LAB — TSH: TSH: 2.367 u[IU]/mL (ref 0.350–4.500)

## 2015-03-20 LAB — BRAIN NATRIURETIC PEPTIDE: B Natriuretic Peptide: 800 pg/mL — ABNORMAL HIGH (ref 0.0–100.0)

## 2015-03-20 LAB — HEMOGLOBIN A1C: Hgb A1c MFr Bld: 5.4 % (ref 4.0–6.0)

## 2015-03-20 MED ORDER — INSULIN ASPART 100 UNIT/ML ~~LOC~~ SOLN: 0.0000 [IU] | Freq: Every day | SUBCUTANEOUS | Status: DC

## 2015-03-20 MED ORDER — ONDANSETRON HCL 4 MG PO TABS: 4.0000 mg | ORAL_TABLET | Freq: Four times a day (QID) | ORAL | Status: DC | PRN

## 2015-03-20 MED ORDER — FUROSEMIDE 10 MG/ML IJ SOLN
40.0000 mg | Freq: Two times a day (BID) | INTRAMUSCULAR | Status: DC
  Administered 2015-03-20 – 2015-03-21 (×3): 40 mg via INTRAVENOUS
  Filled 2015-03-20 (×3): qty 4

## 2015-03-20 MED ORDER — ASPIRIN 81 MG PO CHEW
324.0000 mg | CHEWABLE_TABLET | Freq: Once | ORAL | Status: AC
Start: 2015-03-20 — End: 2015-03-20
  Administered 2015-03-20: 324 mg via ORAL
  Filled 2015-03-20: qty 4

## 2015-03-20 MED ORDER — MORPHINE SULFATE 4 MG/ML IJ SOLN
4.0000 mg | Freq: Once | INTRAMUSCULAR | Status: AC
  Administered 2015-03-20: 4 mg via INTRAVENOUS
  Filled 2015-03-20: qty 1

## 2015-03-20 MED ORDER — NICOTINE 21 MG/24HR TD PT24
21.0000 mg | MEDICATED_PATCH | Freq: Every day | TRANSDERMAL | Status: DC
  Filled 2015-03-20 (×2): qty 1

## 2015-03-20 MED ORDER — ONDANSETRON HCL 4 MG/2ML IJ SOLN: 4.0000 mg | Freq: Four times a day (QID) | INTRAMUSCULAR | Status: DC | PRN

## 2015-03-20 MED ORDER — IOHEXOL 350 MG/ML SOLN
100.0000 mL | Freq: Once | INTRAVENOUS | Status: AC | PRN
  Administered 2015-03-20: 100 mL via INTRAVENOUS

## 2015-03-20 MED ORDER — HEPARIN SODIUM (PORCINE) 5000 UNIT/ML IJ SOLN
5000.0000 [IU] | Freq: Three times a day (TID) | INTRAMUSCULAR | Status: DC
  Administered 2015-03-20 – 2015-03-21 (×4): 5000 [IU] via SUBCUTANEOUS
  Filled 2015-03-20 (×4): qty 1

## 2015-03-20 MED ORDER — NITROGLYCERIN 2 % TD OINT
TOPICAL_OINTMENT | TRANSDERMAL | Status: AC
  Administered 2015-03-20: 1 [in_us]
  Filled 2015-03-20: qty 1

## 2015-03-20 MED ORDER — CARVEDILOL 25 MG PO TABS
ORAL_TABLET | ORAL | Status: AC
  Filled 2015-03-20: qty 1

## 2015-03-20 MED ORDER — CARVEDILOL 12.5 MG PO TABS
12.5000 mg | ORAL_TABLET | Freq: Two times a day (BID) | ORAL | Status: DC
  Administered 2015-03-20 – 2015-03-21 (×3): 12.5 mg via ORAL
  Filled 2015-03-20 (×2): qty 1

## 2015-03-20 MED ORDER — MORPHINE SULFATE 2 MG/ML IJ SOLN
2.0000 mg | INTRAMUSCULAR | Status: DC | PRN
  Administered 2015-03-20 – 2015-03-21 (×3): 2 mg via INTRAVENOUS
  Filled 2015-03-20 (×3): qty 1

## 2015-03-20 MED ORDER — SODIUM CHLORIDE 0.9 % IJ SOLN: 3.0000 mL | INTRAMUSCULAR | Status: DC | PRN

## 2015-03-20 MED ORDER — FUROSEMIDE 10 MG/ML IJ SOLN
40.0000 mg | Freq: Once | INTRAMUSCULAR | Status: AC
  Administered 2015-03-20: 40 mg via INTRAVENOUS
  Filled 2015-03-20: qty 4

## 2015-03-20 MED ORDER — LISINOPRIL 20 MG PO TABS
ORAL_TABLET | ORAL | Status: AC
  Filled 2015-03-20: qty 1

## 2015-03-20 MED ORDER — INSULIN ASPART 100 UNIT/ML ~~LOC~~ SOLN: 0.0000 [IU] | Freq: Three times a day (TID) | SUBCUTANEOUS | Status: DC

## 2015-03-20 MED ORDER — LISINOPRIL 10 MG PO TABS
10.0000 mg | ORAL_TABLET | Freq: Every day | ORAL | Status: DC
  Administered 2015-03-20 – 2015-03-21 (×2): 10 mg via ORAL
  Filled 2015-03-20 (×2): qty 1

## 2015-03-20 MED ORDER — SODIUM CHLORIDE 0.9 % IJ SOLN
3.0000 mL | Freq: Two times a day (BID) | INTRAMUSCULAR | Status: DC
  Administered 2015-03-20: 3 mL via INTRAVENOUS

## 2015-03-20 MED ORDER — LABETALOL HCL 5 MG/ML IV SOLN
5.0000 mg | Freq: Once | INTRAVENOUS | Status: AC
  Administered 2015-03-20: 5 mg via INTRAVENOUS
  Filled 2015-03-20: qty 4

## 2015-03-20 MED ORDER — ACETAMINOPHEN 325 MG PO TABS
650.0000 mg | ORAL_TABLET | Freq: Four times a day (QID) | ORAL | Status: DC | PRN
  Administered 2015-03-20: 650 mg via ORAL
  Filled 2015-03-20: qty 2

## 2015-03-20 MED ORDER — SIMVASTATIN 40 MG PO TABS
40.0000 mg | ORAL_TABLET | Freq: Every day | ORAL | Status: DC
  Administered 2015-03-20: 40 mg via ORAL
  Filled 2015-03-20: qty 1

## 2015-03-20 MED ORDER — ACETAMINOPHEN 650 MG RE SUPP: 650.0000 mg | Freq: Four times a day (QID) | RECTAL | Status: DC | PRN

## 2015-03-20 NOTE — Progress Notes (Addendum)
[place] at [place] NAME: Gabriel [name]    MR#:  [id number]  DATE OF BIRTH:  [birth year]  SUBJECTIVE:  CHIEF COMPLAINT:   Chief Complaint  Patient presents with  . Shortness of Breath   Still shortness of breath and leg edema REVIEW OF SYSTEMS:  CONSTITUTIONAL: No fever, fatigue or weakness.  EYES: No blurred or double vision.  EARS, NOSE, AND THROAT: No tinnitus or ear pain.  RESPIRATORY: Has cough, shortness of breath, no wheezing or hemoptysis.  CARDIOVASCULAR: No chest pain, orthopnea, has leg edema.  GASTROINTESTINAL: No nausea, vomiting, diarrhea or abdominal pain.  GENITOURINARY: No dysuria, hematuria.  ENDOCRINE: No polyuria, nocturia,  HEMATOLOGY: No anemia, easy bruising or bleeding SKIN: No rash or lesion. MUSCULOSKELETAL: No joint pain or arthritis.   NEUROLOGIC: No tingling, numbness, weakness.  PSYCHIATRY: No anxiety or depression.   DRUG ALLERGIES:   Allergies  Allergen Reactions  . Prednisone Other (See Comments)    Reaction: Hallucinations     VITALS:  Blood pressure 129/91, pulse 84, temperature 98 F (36.7 C), temperature source Oral, resp. rate 18, height 6' (1.829 m), weight 109.362 kg (241 lb 1.6 oz), SpO2 98 %.  PHYSICAL EXAMINATION:  GENERAL:  44 y.o.-year-old patient lying in the bed with no acute distress.  EYES: Pupils equal, round, reactive to light and accommodation. No scleral icterus. Extraocular muscles intact.  HEENT: Head atraumatic, normocephalic. Oropharynx and nasopharynx clear.  NECK:  Supple, no jugular venous distention. No thyroid enlargement, no tenderness.  LUNGS: Normal breath sounds bilaterally, no wheezing, mild  rales. No use of accessory muscles of respiration.  CARDIOVASCULAR: S1, S2 normal. No murmurs, rubs, or gallops.  ABDOMEN: Soft, nontender, nondistended. Bowel sounds present. No organomegaly or mass.  EXTREMITIES: Bilateral lower extremity edema 1+, no cyanosis,  or clubbing.  NEUROLOGIC: Cranial nerves II through XII are intact. Muscle strength 5/5 in all extremities. Sensation intact. Gait not checked.  PSYCHIATRIC: The patient is alert and oriented x 3.  SKIN: No obvious rash, lesion, or ulcer.    LABORATORY PANEL:   CBC  Recent Labs Lab 03/20/15 0142  WBC 12.0*  HGB 15.8  HCT 47.6  PLT 154   ------------------------------------------------------------------------------------------------------------------  Chemistries   Recent Labs Lab 03/20/15 0142  NA 139  K 3.7  CL 107  CO2 25  GLUCOSE 123*  BUN 19  CREATININE 0.93  CALCIUM 8.6*   ------------------------------------------------------------------------------------------------------------------  Cardiac Enzymes  Recent Labs Lab 03/20/15 1351  TROPONINI 0.05*   ------------------------------------------------------------------------------------------------------------------  RADIOLOGY:  Dg Chest 2 View  03/20/2015   CLINICAL DATA:  Shortness of breath and chest pain for the past week, worsened tonight.  EXAM: CHEST  2 VIEW  COMPARISON:  10/07/2013  FINDINGS: There is mild unchanged cardiomegaly. There is mild interstitial coarsening. There is no alveolar consolidation. There is no effusion.  IMPRESSION: Unchanged cardiomegaly. Interstitial coarsening, indeterminate chronicity. No airspace consolidation. No effusion.   Electronically Signed   By: Andreas Newport M.D.   On: 03/20/2015 02:08   Ct Angio Chest Pe W/cm &/or Wo Cm  03/20/2015   CLINICAL DATA:  Nonproductive cough. Left midchest pain. Lower extremity swelling.  EXAM: CT ANGIOGRAPHY CHEST WITH CONTRAST  TECHNIQUE: Multidetector CT imaging of the chest was performed using the standard protocol during bolus administration of intravenous contrast. Multiplanar CT image reconstructions and MIPs were obtained to evaluate the vascular anatomy.  CONTRAST:  120mL OMNIPAQUE IOHEXOL 350 MG/ML SOLN  COMPARISON:  None.  FINDINGS: Cardiovascular: There is good opacification of the pulmonary arteries. There is no pulmonary embolism. The thoracic aorta is normal in caliber and intact.  Lungs: There is interlobular septal thickening suggesting interstitial fluid. There are scattered ground-glass opacities which could represent alveolar edema.  Central airways: Patent  Effusions: Small pleural effusion on the left. Moderate pleural effusion on the right.  Lymphadenopathy: There is adenopathy with numerous nodes measuring up to 1.5 cm short axis in the prevascular space and right paratracheal space. There also is a subcarinal node measuring 2 cm in short axis.  Esophagus: Unremarkable  Upper abdomen: No significant abnormality  Musculoskeletal: No significant abnormality  Review of the MIP images confirms the above findings.  IMPRESSION: 1. Negative for acute pulmonary embolism 2. Interstitial fluid or thickening. Ground-glass opacities which may represent alveolar edema. This could represent congestive heart failure. 3. Mediastinal adenopathy.   Electronically Signed   By: Andreas Newport M.D.   On: 03/20/2015 04:19    EKG:   Orders placed or performed during the hospital encounter of 03/20/15  . ED EKG within 10 minutes  . ED EKG within 10 minutes  . EKG 12-Lead  . EKG 12-Lead    ASSESSMENT AND PLAN:   1. Acute diastolic CHF.   Continue Lasix 40 mg IV twice a day with CHF protocol. Follow-up Dr. Humphrey Rolls for stress test or cardiac catheterization. Echocardiogram shows ejection fraction 52% per Dr.Khan.  * elevated troponin, possible due to demand ischemia secondary to CHF. Continue aspirin and add Zocor.  2. Hypertension: controlled; continue carvedilol, lisinopril and lasix.  3. DM. Start a sliding scale.  Hemoglobin A1c 5.3.  4. Tobacco abuse: Smoking cessation was counseled for 3 minute, NicoDerm patch.     All the records are reviewed and case discussed with Care Management/Social Workerr. Management  plans discussed with the patient, family and they are in agreement.  CODE STATUS: Full code  TOTAL TIME TAKING CARE OF THIS PATIENT: 38 minutes.   POSSIBLE D/C IN 3 DAYS, DEPENDING ON CLINICAL CONDITION.   Demetrios Loll M.D on 03/20/2015 at 3:22 PM  Between 7am to 6pm - Pager - [phone]  After 6pm go to [email or web address] - password [email or web address]  [facility]  Office  [phone]  CC: Primary care physician; No PCP Per Patient

## 2015-03-20 NOTE — ED Notes (Signed)
Pt taken to xray 

## 2015-03-20 NOTE — Progress Notes (Signed)
*  PRELIMINARY RESULTS* Echocardiogram 2D Echocardiogram has been performed.  Gabriel [name] 03/20/2015, 3:05 PM

## 2015-03-20 NOTE — Progress Notes (Signed)
[name] updated on pt's status, orders received to discontinue continuous pulse oximetry. Gabriel [name]

## 2015-03-20 NOTE — ED Notes (Signed)
Pt uprite on stretcher in exam room with family at bedside; pt reports 9/10 generalized HA after NTP applied; Dr Archie Balboa notified and NTP removed as ordered

## 2015-03-20 NOTE — ED Notes (Signed)
Pt returned to room from CT; st pain unchanged; med admin as ordered

## 2015-03-20 NOTE — ED Notes (Signed)
Dr Archie Balboa notified of pt's labored breathing

## 2015-03-20 NOTE — ED Notes (Signed)
Pt to CT via stretcher

## 2015-03-20 NOTE — ED Notes (Signed)
Pt states that he has been feeling like this for the past week, states that he has gotten worse tonight, pt states that he has felt this way once before and states that it was pneumonia, pt states that he has been coughing at times and has been unable to get anything up, pt states that he feels like his abd is bloated and states that it is causing him pain in his left mid chest with the increased work of breathing, pt states that he also notice swelling in his bilat lower extremities this past week as well, pt states that he was here recently treated for a pulled muscle in his groin, pt denies taking meds for anything

## 2015-03-20 NOTE — ED Notes (Signed)
Pt uprite on stretcher in exam room with no distress noted; pt reports HA pain much decreased after medication; pt voices good understanding of admission plan; urinal placed at bedside

## 2015-03-20 NOTE — H&P (Addendum)
Gabriel [name]. is an 44 y.o. male.   Chief Complaint: Chest pain HPI: Patient presents emergency department via EMS after suffering chest pain and shortness of breath. Both began when he lay down for bed. His pain is worse with deep inspiration. He is also notes that for the last few days his stomach has been swelling in his feet have been gradually swelling as well. He has a nonproductive cough but has also noticed nosebleeds and orthopnea. In the emergency department he was found to have an elevated BNP as well as mildly elevated troponin which prompted the emergency department staff to call for admission.  Past Medical History  Diagnosis Date  . Hypertension     Resolved since weight loss  . Obesity (BMI 30.0-34.9)   . Psoriasis     Past Surgical History  Procedure Laterality Date  . Amputation    . Finger amputation      Traumatic    Family History  Problem Relation Age of Onset  . Diabetes Mellitus II Mother    Social History:  reports that he has been smoking.  He does not have any smokeless tobacco history on file. He reports that he drinks alcohol. His drug history is not on file.  Allergies:  Allergies  Allergen Reactions  . Prednisone Other (See Comments)    Reaction: Hallucinations      (Not in a hospital admission)  Results for orders placed or performed during the hospital encounter of 03/20/15 (from the past 48 hour(s))  Basic metabolic panel     Status: Abnormal   Collection Time: 03/20/15  1:42 AM  Result Value Ref Range   Sodium 139 135 - 145 mmol/L   Potassium 3.7 3.5 - 5.1 mmol/L   Chloride 107 101 - 111 mmol/L   CO2 25 22 - 32 mmol/L   Glucose, Bld 123 (H) 65 - 99 mg/dL   BUN 19 6 - 20 mg/dL   Creatinine, Ser 0.93 0.61 - 1.24 mg/dL   Calcium 8.6 (L) 8.9 - 10.3 mg/dL   GFR calc non Af Amer >60 >60 mL/min   GFR calc Af Amer >60 >60 mL/min    Comment: (NOTE) The eGFR has been calculated using the CKD EPI equation. This calculation has not been  validated in all clinical situations. eGFR's persistently <60 mL/min signify possible Chronic Kidney Disease.    Anion gap 7 5 - 15  CBC     Status: Abnormal   Collection Time: 03/20/15  1:42 AM  Result Value Ref Range   WBC 12.0 (H) 3.8 - 10.6 K/uL   RBC 5.67 4.40 - 5.90 MIL/uL   Hemoglobin 15.8 13.0 - 18.0 g/dL   HCT 47.6 40.0 - 52.0 %   MCV 83.9 80.0 - 100.0 fL   MCH 27.8 26.0 - 34.0 pg   MCHC 33.2 32.0 - 36.0 g/dL   RDW 14.2 11.5 - 14.5 %   Platelets 154 150 - 440 K/uL  Troponin I     Status: Abnormal   Collection Time: 03/20/15  1:42 AM  Result Value Ref Range   Troponin I 0.07 (H) <0.031 ng/mL    Comment: READ BACK AND VERIFIED WITH BRANDY DAVIS AT 2440 03/20/15.PMH        PERSISTENTLY INCREASED TROPONIN VALUES IN THE RANGE OF 0.04-0.49 ng/mL CAN BE SEEN IN:       -UNSTABLE ANGINA       -CONGESTIVE HEART FAILURE       -MYOCARDITIS       -  CHEST TRAUMA       -ARRYHTHMIAS       -LATE PRESENTING MYOCARDIAL INFARCTION       -COPD   CLINICAL FOLLOW-UP RECOMMENDED.   Brain natriuretic peptide     Status: Abnormal   Collection Time: 03/20/15  1:42 AM  Result Value Ref Range   B Natriuretic Peptide 800.0 (H) 0.0 - 100.0 pg/mL   Dg Chest 2 View  03/20/2015   CLINICAL DATA:  Shortness of breath and chest pain for the past week, worsened tonight.  EXAM: CHEST  2 VIEW  COMPARISON:  10/07/2013  FINDINGS: There is mild unchanged cardiomegaly. There is mild interstitial coarsening. There is no alveolar consolidation. There is no effusion.  IMPRESSION: Unchanged cardiomegaly. Interstitial coarsening, indeterminate chronicity. No airspace consolidation. No effusion.   Electronically Signed   By: Andreas Newport M.D.   On: 03/20/2015 02:08   Ct Angio Chest Pe W/cm &/or Wo Cm  03/20/2015   CLINICAL DATA:  Nonproductive cough. Left midchest pain. Lower extremity swelling.  EXAM: CT ANGIOGRAPHY CHEST WITH CONTRAST  TECHNIQUE: Multidetector CT imaging of the chest was performed using the  standard protocol during bolus administration of intravenous contrast. Multiplanar CT image reconstructions and MIPs were obtained to evaluate the vascular anatomy.  CONTRAST:  169m OMNIPAQUE IOHEXOL 350 MG/ML SOLN  COMPARISON:  None.  FINDINGS: Cardiovascular: There is good opacification of the pulmonary arteries. There is no pulmonary embolism. The thoracic aorta is normal in caliber and intact.  Lungs: There is interlobular septal thickening suggesting interstitial fluid. There are scattered ground-glass opacities which could represent alveolar edema.  Central airways: Patent  Effusions: Small pleural effusion on the left. Moderate pleural effusion on the right.  Lymphadenopathy: There is adenopathy with numerous nodes measuring up to 1.5 cm short axis in the prevascular space and right paratracheal space. There also is a subcarinal node measuring 2 cm in short axis.  Esophagus: Unremarkable  Upper abdomen: No significant abnormality  Musculoskeletal: No significant abnormality  Review of the MIP images confirms the above findings.  IMPRESSION: 1. Negative for acute pulmonary embolism 2. Interstitial fluid or thickening. Ground-glass opacities which may represent alveolar edema. This could represent congestive heart failure. 3. Mediastinal adenopathy.   Electronically Signed   By: DAndreas NewportM.D.   On: 03/20/2015 04:19    Review of Systems  Constitutional: Negative for fever and chills.  HENT: Negative for sore throat and tinnitus.   Eyes: Negative for blurred vision and redness.  Respiratory: Positive for shortness of breath. Negative for cough.   Cardiovascular: Positive for chest pain and orthopnea. Negative for palpitations and PND.  Gastrointestinal: Negative for nausea, vomiting, abdominal pain and diarrhea.  Genitourinary: Negative for dysuria, urgency and frequency.  Musculoskeletal: Negative for myalgias and joint pain.  Skin: Negative for rash.       No lesions  Neurological:  Negative for speech change, focal weakness and weakness.  Endo/Heme/Allergies: Does not bruise/bleed easily.       No temperature intolerance  Psychiatric/Behavioral: Negative for depression and suicidal ideas.    Blood pressure 158/115, pulse 88, temperature 98.1 F (36.7 C), temperature source Oral, resp. rate 23, height 6' (1.829 m), weight 105.688 kg (233 lb), SpO2 97 %. Physical Exam  Nursing note and vitals reviewed. Constitutional: He is oriented to person, place, and time. He appears well-developed and well-nourished. No distress.  HENT:  Head: Normocephalic and atraumatic.  Mouth/Throat: Oropharynx is clear and moist.  Eyes: Conjunctivae and EOM are normal.  Pupils are equal, round, and reactive to light. No scleral icterus.  Neck: Normal range of motion. Neck supple. No JVD present. No tracheal deviation present. No thyromegaly present.  Cardiovascular: Normal rate, regular rhythm and intact distal pulses.  Exam reveals no gallop and no friction rub.   No murmur heard. Respiratory: Effort normal and breath sounds normal. No respiratory distress.  GI: Soft. Bowel sounds are normal. He exhibits no distension. There is no tenderness.  Genitourinary:  Deferred  Musculoskeletal: Normal range of motion. He exhibits edema.  Lymphadenopathy:    He has no cervical adenopathy.  Neurological: He is alert and oriented to person, place, and time. No cranial nerve deficit.  Skin: Skin is warm and dry. No rash noted. No erythema.  Psychiatric: He has a normal mood and affect. His behavior is normal. Judgment and thought content normal.     Assessment/Plan This is a 44 year old Caucasian male admitted for chest pain and pulmonary congestion. 1. Chest pain: Likely due to pulmonary congestion as no pulmonary embolism seen on CTA of the chest. We will follow cardiac enzymes and consult cardiology. No EKG changes suggestive of ischemia. 2. Pulmonary congestion: Shortness of breath and elevated  BNP. The patient does not have a history of congestive heart failure. Needs echocardiogram 3. Hypertension: Uncontrolled; continue carvedilol 4. Obesity: BMI is 31.9; the patient has lost approximately 70 pounds intentionally. Velma congestive heart failure may be secondary to obesity-induced 5. Tobacco abuse: NicoDerm patch 6. DVT prophylaxis: Heparin 7. GI prophylaxis: None The patient is a full code. Time spent on admission orders and patient care approximately 35 minutes  Harrie Foreman 03/20/2015, 8:15 AM

## 2015-03-20 NOTE — ED Notes (Signed)
Pt back from x-ray, family at bedside, cont to be labored to breathe

## 2015-03-20 NOTE — Progress Notes (Signed)
44 yo wm admitted to room 234 via stretcher from ED with SOB.  A&O x3, gait steady.  Pt complaining of pain rt rib cage area with some dyspnea.  2LO2 per [place] applied.  Cardiac monitor applied to pt, pt denies chest pain at this time.  Lungs with scattered crackles bil.  Abdomen benign.  SL lt ac flushes well.  POC reviewed with pt and son.  Oriented to room and surroundings.  Denies need at this time.  CB in reach, SR up x 2.

## 2015-03-20 NOTE — Progress Notes (Signed)
Gabriel [name]. is a 44 y.o. 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 Patient was also having severe shortness of breath he's he states that he couldn't breathe. He was also having a nonproductive cough and noticed some orthopnea and nosebleed. His troponins were elevated thus I was asked to evaluate the patient. Patient's continues to have intermittent pressure type chest pain but the shortness of breath has gotten better.   Review of Systems: Review of Systems  Respiratory: Positive for cough and shortness of breath.   Cardiovascular: Positive for chest pain.  Neurological: Positive for headaches.      Past Medical History  Diagnosis Date  . Hypertension     Resolved since weight loss  . Obesity (BMI 30.0-34.9)   . Psoriasis   . Diabetes mellitus without complication     Pre diabetic    Medications Prior to Admission  Medication Sig Dispense Refill  . traMADol (ULTRAM) 50 MG tablet Take 1 tablet (50 mg total) by mouth every 8 (eight) hours as needed for moderate pain. DO NOT drive while taking this medication. 15 tablet 0     . carvedilol      . carvedilol  12.5 mg Oral BID WC  . furosemide  40 mg Intravenous Q12H  . heparin  5,000 Units Subcutaneous 3 times per day  . lisinopril  10 mg Oral Daily  . nicotine  21 mg Transdermal Daily  . sodium chloride  3 mL Intravenous Q12H    Infusions:    Allergies  Allergen Reactions  . Prednisone Other (See Comments)    Reaction: Hallucinations     History   Social History  . Marital Status: Divorced    Spouse Name: N/A  . Number of Children: N/A  . Years of Education: N/A   Occupational  History  . Not on file.   Social History Main Topics  . Smoking status: Current Every Day Smoker -- 1.50 packs/day for 33 years    Types: Cigarettes  . Smokeless tobacco: Not on file  . Alcohol Use: 0.0 oz/week    0 Standard drinks or equivalent per week  . Drug Use: No  . Sexual Activity: Not on file   Other Topics Concern  . Not on file   Social History Narrative    Family History  Problem Relation Age of Onset  . Diabetes Mellitus II Mother     PHYSICAL EXAM: Filed Vitals:   03/20/15 1158  BP: 129/91  Pulse: 84  Temp: 98 F (36.7 C)  Resp: 18     Intake/Output Summary (Last 24 hours) at 03/20/15 1238 Last data filed at 03/20/15 1200  Gross per 24 hour  Intake      0 ml  Output   3250 ml  Net  -3250 ml    General:  Well appearing. No respiratory difficulty HEENT: normal Neck: supple. no JVD. Carotids 2+ bilat; no bruits. No lymphadenopathy or thryomegaly appreciated. Cor: PMI nondisplaced. Regular rate & rhythm. No rubs, gallops or murmurs. Lungs: clear Abdomen: soft, nontender, nondistended. No hepatosplenomegaly. No bruits or masses. Good bowel sounds. Extremities: no cyanosis, clubbing, rash, edema Neuro: alert & oriented x 3, cranial  nerves grossly intact. moves all 4 extremities w/o difficulty. Affect pleasant.  ECG: Sinus tachycardia with nonspecific ST-T changes  Results for orders placed or performed during the hospital encounter of 03/20/15 (from the past 24 hour(s))  Basic metabolic panel     Status: Abnormal   Collection Time: 03/20/15  1:42 AM  Result Value Ref Range   Sodium 139 135 - 145 mmol/L   Potassium 3.7 3.5 - 5.1 mmol/L   Chloride 107 101 - 111 mmol/L   CO2 25 22 - 32 mmol/L   Glucose, Bld 123 (H) 65 - 99 mg/dL   BUN 19 6 - 20 mg/dL   Creatinine, Ser 0.93 0.61 - 1.24 mg/dL   Calcium 8.6 (L) 8.9 - 10.3 mg/dL   GFR calc non Af Amer >60 >60 mL/min   GFR calc Af Amer >60 >60 mL/min   Anion gap 7 5 - 15  CBC     Status: Abnormal    Collection Time: 03/20/15  1:42 AM  Result Value Ref Range   WBC 12.0 (H) 3.8 - 10.6 K/uL   RBC 5.67 4.40 - 5.90 MIL/uL   Hemoglobin 15.8 13.0 - 18.0 g/dL   HCT 47.6 40.0 - 52.0 %   MCV 83.9 80.0 - 100.0 fL   MCH 27.8 26.0 - 34.0 pg   MCHC 33.2 32.0 - 36.0 g/dL   RDW 14.2 11.5 - 14.5 %   Platelets 154 150 - 440 K/uL  Troponin I     Status: Abnormal   Collection Time: 03/20/15  1:42 AM  Result Value Ref Range   Troponin I 0.07 (H) <0.031 ng/mL  Brain natriuretic peptide     Status: Abnormal   Collection Time: 03/20/15  1:42 AM  Result Value Ref Range   B Natriuretic Peptide 800.0 (H) 0.0 - 100.0 pg/mL  TSH     Status: None   Collection Time: 03/20/15  1:42 AM  Result Value Ref Range   TSH 2.367 0.350 - 4.500 uIU/mL  Troponin I     Status: Abnormal   Collection Time: 03/20/15  7:55 AM  Result Value Ref Range   Troponin I 0.07 (H) <0.031 ng/mL   Dg Chest 2 View  03/20/2015   CLINICAL DATA:  Shortness of breath and chest pain for the past week, worsened tonight.  EXAM: CHEST  2 VIEW  COMPARISON:  10/07/2013  FINDINGS: There is mild unchanged cardiomegaly. There is mild interstitial coarsening. There is no alveolar consolidation. There is no effusion.  IMPRESSION: Unchanged cardiomegaly. Interstitial coarsening, indeterminate chronicity. No airspace consolidation. No effusion.   Electronically Signed   By: Andreas Newport M.D.   On: 03/20/2015 02:08   Ct Angio Chest Pe W/cm &/or Wo Cm  03/20/2015   CLINICAL DATA:  Nonproductive cough. Left midchest pain. Lower extremity swelling.  EXAM: CT ANGIOGRAPHY CHEST WITH CONTRAST  TECHNIQUE: Multidetector CT imaging of the chest was performed using the standard protocol during bolus administration of intravenous contrast. Multiplanar CT image reconstructions and MIPs were obtained to evaluate the vascular anatomy.  CONTRAST:  166mL OMNIPAQUE IOHEXOL 350 MG/ML SOLN  COMPARISON:  None.  FINDINGS: Cardiovascular: There is good opacification of the  pulmonary arteries. There is no pulmonary embolism. The thoracic aorta is normal in caliber and intact.  Lungs: There is interlobular septal thickening suggesting interstitial fluid. There are scattered ground-glass opacities which could represent alveolar edema.  Central airways: Patent  Effusions: Small pleural effusion on the left. Moderate pleural effusion on the right.  Lymphadenopathy: There is adenopathy with numerous nodes measuring up to 1.5 cm short axis in the prevascular space and right paratracheal space. There also is a subcarinal node measuring 2 cm in short axis.  Esophagus: Unremarkable  Upper abdomen: No significant abnormality  Musculoskeletal: No significant abnormality  Review of the MIP images confirms the above findings.  IMPRESSION: 1. Negative for acute pulmonary embolism 2. Interstitial fluid or thickening. Ground-glass opacities which may represent alveolar edema. This could represent congestive heart failure. 3. Mediastinal adenopathy.   Electronically Signed   By: Andreas Newport M.D.   On: 03/20/2015 04:19     ASSESSMENT AND PLAN: Patient has diastolic heart failure with borderline left ventricular systolic function with the left radical ejection fraction 73% and mild diastolic dysfunction on echocardiogram. Patient continues to have intermittent pressure type chest pain and probably has coronary artery disease. Agree with current treatment as shortness of breath is getting better with improvement of CHF. He will need outpatient stress test,, but has no insurance and if he continues to have chest pain may consider cardiac catheterization. He may not show up as an outpatient for stress test and may have to be scheduled in the hospital.  [facility] A

## 2015-03-20 NOTE — ED Provider Notes (Signed)
[facility] Emergency Department Provider Note    ____________________________________________  Time seen: 0230  I have reviewed the triage vital signs and the nursing notes.   HISTORY  Chief Complaint Shortness of Breath   History limited by: Not Limited   HPI Gabriel N Zakar Brosch. is a 44 y.o. male who presents to the emergency department today with concerns of shortness of breath. He states this is been going on for roughly 1 week. It has gone worse. He states it is accompanied with chest pain. He states the chest pain is worse when he takes deep breaths. It is a feeling of tightness across the lower chest. Patient additionally states that he has had some cough. Denies any blood in the cough. Denies any recent travel.Denies any fever during the past week.     History reviewed. No pertinent past medical history.  There are no active problems to display for this patient.   Past Surgical History  Procedure Laterality Date  . Amputation    . Finger amputation      Current Outpatient Rx  Name  Route  Sig  Dispense  Refill  . traMADol (ULTRAM) 50 MG tablet   Oral   Take 1 tablet (50 mg total) by mouth every 8 (eight) hours as needed for moderate pain. DO NOT drive while taking this medication.   15 tablet   0     Allergies Review of patient's allergies indicates no known allergies.  History reviewed. No pertinent family history.  Social History History  Substance Use Topics  . Smoking status: Current Every Day Smoker  . Smokeless tobacco: Not on file  . Alcohol Use: Yes    Review of Systems  Constitutional: Negative for fever. Cardiovascular: Positive for chest pain. Respiratory: Positive for shortness of breath. Gastrointestinal: Negative for abdominal pain, vomiting and diarrhea. Genitourinary: Negative for dysuria. Musculoskeletal: Negative for back pain. Skin: Negative for rash. Neurological: Negative for headaches, focal  weakness or numbness.   10-point ROS otherwise negative.  ____________________________________________   PHYSICAL EXAM:  VITAL SIGNS: ED Triage Vitals  Enc Vitals Group     BP 03/20/15 0139 187/126 mmHg     Pulse Rate 03/20/15 0139 101     Resp 03/20/15 0139 32     Temp 03/20/15 0139 98.1 F (36.7 C)     Temp Source 03/20/15 0139 Oral     SpO2 03/20/15 0139 95 %     Weight 03/20/15 0139 233 lb (105.688 kg)     Height 03/20/15 0139 6' (1.829 m)     Head Cir --      Peak Flow --      Pain Score 03/20/15 0140 8   Constitutional: Alert and oriented. Appears in moderate distress Eyes: Conjunctivae are normal. PERRL. Normal extraocular movements. ENT   Head: Normocephalic and atraumatic.   Nose: No congestion/rhinnorhea.   Mouth/Throat: Mucous membranes are moist.   Neck: No stridor. Hematological/Lymphatic/Immunilogical: No cervical lymphadenopathy. Cardiovascular: Tachycardic, regular rhythm.  No murmurs, rubs, or gallops. Respiratory: Normal respiratory effort without tachypnea nor retractions. Breath sounds are clear and equal bilaterally. No wheezes/rales/rhonchi. Gastrointestinal: Soft and nontender. No distention.  Genitourinary: Deferred Musculoskeletal: Normal range of motion in all extremities. No joint effusions.  No lower extremity tenderness nor edema. Neurologic:  Normal speech and language. No gross focal neurologic deficits are appreciated. Speech is normal.  Skin:  Skin is warm, dry and intact. No rash noted. Psychiatric: Mood and affect are normal. Speech and behavior  are normal. Patient exhibits appropriate insight and judgment.  ____________________________________________    LABS (pertinent positives/negatives)  Labs Reviewed  BASIC METABOLIC PANEL - Abnormal; Notable for the following:    Glucose, Bld 123 (*)    Calcium 8.6 (*)    All other components within normal limits  CBC - Abnormal; Notable for the following:    WBC 12.0 (*)     All other components within normal limits  TROPONIN I - Abnormal; Notable for the following:    Troponin I 0.07 (*)    All other components within normal limits  BRAIN NATRIURETIC PEPTIDE - Abnormal; Notable for the following:    B Natriuretic Peptide 800.0 (*)    All other components within normal limits     ____________________________________________   EKG  I, Nance Pear, attending physician, personally viewed and interpreted this EKG  EKG Time: 0138 Rate: 101 Rhythm: sinus tachycardia Axis: normal Intervals: qtc 500 QRS: narrow ST changes: no st elevation ____________________________________________    RADIOLOGY  CXR IMPRESSION: Unchanged cardiomegaly. Interstitial coarsening, indeterminate chronicity. No airspace consolidation. No effusion.  CTA PE IMPRESSION: 1. Negative for acute pulmonary embolism 2. Interstitial fluid or thickening. Ground-glass opacities which may represent alveolar edema. This could represent congestive heart failure. 3. Mediastinal adenopathy.  ____________________________________________   PROCEDURES  Procedure(s) performed: None  Critical Care performed: Yes, see critical care note(s)  CRITICAL CARE Performed by: Nance Pear   Total critical care time: 30  Critical care time was exclusive of separately billable procedures and treating other patients.  Critical care was necessary to treat or prevent imminent or life-threatening deterioration.  Critical care was time spent personally by me on the following activities: development of treatment plan with patient and/or surrogate as well as nursing, discussions with consultants, evaluation of patient's response to treatment, examination of patient, obtaining history from patient or surrogate, ordering and performing treatments and interventions, ordering and review of laboratory studies, ordering and review of radiographic studies, pulse oximetry and re-evaluation of  patient's condition.  ____________________________________________   INITIAL IMPRESSION / ASSESSMENT AND PLAN / ED COURSE  Pertinent labs & imaging results that were available during my care of the patient were reviewed by me and considered in my medical decision making (see chart for details).  Patient presented to the emergency department today with 1 week of increasing chest pain and shortness of breath. On exam patient did appear to be in moderate distress. Patient was hypertensive. Mildly tachycardic. Lab work concerning for an elevated troponin as well as elevated BNP. Given history of chest pain did get a CT PE to evaluate for pulmonary embolism. This was negative. This point it does appear patient be in heart failure. This is been new diagnosis for the patient. Additionally the elevated troponin of some concern that he had an acute coronary episode that might of started the heart failure. Will plan admission for the patient for further workup and evaluation.  ____________________________________________   FINAL CLINICAL IMPRESSION(S) / ED DIAGNOSES  Final diagnoses:  Elevated troponin  Shortness of breath  Chest pain, unspecified chest pain type     Nance Pear, MD 03/20/15 [phone]

## 2015-03-20 NOTE — ED Notes (Signed)
Pt states that he has been having shortness of breath for the past week, states that it has gotten worse tonight and causing him to hurt in his chest when he attempts to breathe, pt is breathing at a rapid pace at this time, room air sat noted to be 95%

## 2015-03-21 LAB — LIPID PANEL
Cholesterol: 149 mg/dL (ref 0–200)
HDL: 22 mg/dL — ABNORMAL LOW (ref >40)
LDL Cholesterol: 93 mg/dL (ref 0–99)
Total CHOL/HDL Ratio: 6.8 RATIO
Triglycerides: 170 mg/dL — ABNORMAL HIGH (ref <150)
VLDL: 34 mg/dL (ref 0–40)

## 2015-03-21 LAB — BASIC METABOLIC PANEL
Anion gap: 8 (ref 5–15)
BUN: 21 mg/dL — ABNORMAL HIGH (ref 6–20)
CO2: 30 mmol/L (ref 22–32)
Calcium: 8.6 mg/dL — ABNORMAL LOW (ref 8.9–10.3)
Chloride: 103 mmol/L (ref 101–111)
Creatinine, Ser: 0.98 mg/dL (ref 0.61–1.24)
GFR calc Af Amer: >60 mL/min (ref >60)
GFR calc non Af Amer: >60 mL/min (ref >60)
Glucose, Bld: 114 mg/dL — ABNORMAL HIGH (ref 65–99)
Potassium: 3.6 mmol/L (ref 3.5–5.1)
Sodium: 141 mmol/L (ref 135–145)

## 2015-03-21 LAB — MAGNESIUM: Magnesium: 1.8 mg/dL (ref 1.7–2.4)

## 2015-03-21 MED ORDER — FUROSEMIDE 40 MG PO TABS: 40.0000 mg | ORAL_TABLET | Freq: Two times a day (BID) | ORAL | Status: DC

## 2015-03-21 MED ORDER — CARVEDILOL 12.5 MG PO TABS: 12.5000 mg | ORAL_TABLET | Freq: Two times a day (BID) | ORAL | Status: DC

## 2015-03-21 MED ORDER — SIMVASTATIN 40 MG PO TABS: 40.0000 mg | ORAL_TABLET | Freq: Every day | ORAL | Status: DC

## 2015-03-21 MED ORDER — LISINOPRIL 10 MG PO TABS: 10.0000 mg | ORAL_TABLET | Freq: Every day | ORAL | Status: DC

## 2015-03-21 MED ORDER — ASPIRIN EC 81 MG PO TBEC: 81.0000 mg | DELAYED_RELEASE_TABLET | Freq: Every day | ORAL | Status: DC

## 2015-03-21 NOTE — Discharge Instructions (Signed)
Heart Failure Clinic appointment on March 31, 2015 at 11:00am with [name], [place]. Please call [phone] to reschedule.   Low sodium, low fat and ADA diet. Activity as tolerated. Smoking cessation.

## 2015-03-21 NOTE — Progress Notes (Signed)
Pt A&O, VSS, morphine given x1 for a complaint of chest pain after pt became upset with MD. Order to discharge pt to home. Pt cleared with case mgnt. Discharge instructions, prescriptions and education given to pt and father with verbal acknowledgment of understanding. Pt self ambulated off unit

## 2015-03-21 NOTE — Discharge Summary (Addendum)
[facility] [place] NAME: Gabriel [name]    MR#:  [id number]  DATE OF BIRTH:  [birth year]  DATE OF ADMISSION:  03/20/2015 ADMITTING PHYSICIAN: Harrie Foreman, MD  DATE OF DISCHARGE: 03/21/2015 12:25 PM  PRIMARY CARE PHYSICIAN: No PCP Per Patient    ADMISSION DIAGNOSIS:  Shortness of breath [R06.02] Elevated troponin [R79.89] Chest pain, unspecified chest pain type [R07.9]   DISCHARGE DIAGNOSIS:   Acute diastolic CHF Elevated troponin due to demanding ischemia SECONDARY DIAGNOSIS:   Past Medical History  Diagnosis Date  . Hypertension     Resolved since weight loss  . Obesity (BMI 30.0-34.9)   . Psoriasis   . Diabetes mellitus without complication     Pre diabetic    HOSPITAL COURSE:   1. Acute diastolic CHF.  The patient has been treated with Lasix 40 mg IV twice a day with CHF protocol. Echocardiogram shows ejection fraction 52%. His symptoms has much better. Per Dr.Khan, patient may be discharged today in the follow-up as outpatient.  * elevated troponin, possible due to demand ischemia secondary to CHF.  2. Hypertension: controlled; continue carvedilol, lisinopril and lasix.  3. DM.  Hemoglobin A1c 5.3. Diet control.  4. Tobacco abuse: Smoking cessation was counseled for 3 minute, NicoDerm patch.  DISCHARGE CONDITIONS:   Stable, he was discharged to home today. I called the patient just now, remind him complying to home medication including aspirin and advised him for smoking cessation again.  CONSULTS OBTAINED:  Treatment Team:  Dionisio David, MD  DRUG ALLERGIES:   Allergies  Allergen Reactions  . Prednisone Other (See Comments)    Reaction: Hallucinations     DISCHARGE MEDICATIONS:   Discharge Medication List as of 03/21/2015 11:44 AM    START taking these medications   Details  carvedilol (COREG) 12.5 MG tablet Take 1 tablet (12.5 mg total) by mouth 2 (two) times daily with a meal., Starting  03/21/2015, Until Discontinued, Print    furosemide (LASIX) 40 MG tablet Take 1 tablet (40 mg total) by mouth 2 (two) times daily., Starting 03/21/2015, Until Discontinued, Print    lisinopril (PRINIVIL,ZESTRIL) 10 MG tablet Take 1 tablet (10 mg total) by mouth daily., Starting 03/21/2015, Until Discontinued, Print    simvastatin (ZOCOR) 40 MG tablet Take 1 tablet (40 mg total) by mouth at bedtime., Starting 03/21/2015, Until Discontinued, Print      STOP taking these medications     traMADol (ULTRAM) 50 MG tablet          DISCHARGE INSTRUCTIONS:    If you experience worsening of your admission symptoms, develop shortness of breath, life threatening emergency, suicidal or homicidal thoughts you must seek medical attention immediately by calling 911 or calling your MD immediately  if symptoms less severe.  You Must read complete instructions/literature along with all the possible adverse reactions/side effects for all the Medicines you take and that have been prescribed to you. Take any new Medicines after you have completely understood and accept all the possible adverse reactions/side effects.   Please note  You were cared for by a hospitalist during your hospital stay. If you have any questions about your discharge medications or the care you received while you were in the hospital after you are discharged, you can call the unit and asked to speak with the hospitalist on call if the hospitalist that took care of you is not available. Once you are discharged, your primary care physician will  handle any further medical issues. Please note that NO REFILLS for any discharge medications will be authorized once you are discharged, as it is imperative that you return to your primary care physician (or establish a relationship with a primary care physician if you do not have one) for your aftercare needs so that they can reassess your need for medications and monitor your lab values.    Today    SUBJECTIVE      VITAL SIGNS:  Blood pressure 139/94, pulse 75, temperature 98.3 F (36.8 C), temperature source Oral, resp. rate 18, height 6' (1.829 m), weight 107.049 kg (236 lb), SpO2 98 %.  I/O:   Intake/Output Summary (Last 24 hours) at 03/21/15 1527 Last data filed at 03/21/15 0948  Gross per 24 hour  Intake    480 ml  Output   3100 ml  Net  -2620 ml    PHYSICAL EXAMINATION:  GENERAL:  44 y.o.-year-old patient lying in the bed with no acute distress.  EYES: Pupils equal, round, reactive to light and accommodation. No scleral icterus. Extraocular muscles intact.  HEENT: Head atraumatic, normocephalic. Oropharynx and nasopharynx clear.  NECK:  Supple, no jugular venous distention. No thyroid enlargement, no tenderness.  LUNGS: Normal breath sounds bilaterally, no wheezing, rales,rhonchi or crepitation. No use of accessory muscles of respiration.  CARDIOVASCULAR: S1, S2 normal. No murmurs, rubs, or gallops.  ABDOMEN: Soft, non-tender, non-distended. Bowel sounds present. No organomegaly or mass.  EXTREMITIES: No pedal edema, cyanosis, or clubbing.  NEUROLOGIC: Cranial nerves II through XII are intact. Muscle strength 5/5 in all extremities. Sensation intact. Gait not checked.  PSYCHIATRIC: The patient is alert and oriented x 3.  SKIN: No obvious rash, lesion, or ulcer.   DATA REVIEW:   CBC  Recent Labs Lab 03/20/15 0142  WBC 12.0*  HGB 15.8  HCT 47.6  PLT 154    Chemistries   Recent Labs Lab 03/21/15 0336  NA 141  K 3.6  CL 103  CO2 30  GLUCOSE 114*  BUN 21*  CREATININE 0.98  CALCIUM 8.6*  MG 1.8    Cardiac Enzymes  Recent Labs Lab 03/20/15 1351  TROPONINI 0.05*    Microbiology Results  Results for orders placed or performed in visit on 08/26/12  Influenza A&B Antigens [facility])     Status: None   Collection Time: 08/26/12 11:07 AM  Result Value Ref Range Status   Micro Text Report   Final       COMMENT                   NEGATIVE FOR  INFLUENZA A (ANTIGEN ABSENT)   COMMENT                   NEGATIVE FOR INFLUENZA B (ANTIGEN ABSENT)   ANTIBIOTIC                                                        RADIOLOGY:  Dg Chest 2 View  03/20/2015   CLINICAL DATA:  Shortness of breath and chest pain for the past week, worsened tonight.  EXAM: CHEST  2 VIEW  COMPARISON:  10/07/2013  FINDINGS: There is mild unchanged cardiomegaly. There is mild interstitial coarsening. There is no alveolar consolidation. There is no effusion.  IMPRESSION: Unchanged cardiomegaly. Interstitial coarsening, indeterminate chronicity.  No airspace consolidation. No effusion.   Electronically Signed   By: Andreas Newport M.D.   On: 03/20/2015 02:08   Ct Angio Chest Pe W/cm &/or Wo Cm  03/20/2015   CLINICAL DATA:  Nonproductive cough. Left midchest pain. Lower extremity swelling.  EXAM: CT ANGIOGRAPHY CHEST WITH CONTRAST  TECHNIQUE: Multidetector CT imaging of the chest was performed using the standard protocol during bolus administration of intravenous contrast. Multiplanar CT image reconstructions and MIPs were obtained to evaluate the vascular anatomy.  CONTRAST:  173mL OMNIPAQUE IOHEXOL 350 MG/ML SOLN  COMPARISON:  None.  FINDINGS: Cardiovascular: There is good opacification of the pulmonary arteries. There is no pulmonary embolism. The thoracic aorta is normal in caliber and intact.  Lungs: There is interlobular septal thickening suggesting interstitial fluid. There are scattered ground-glass opacities which could represent alveolar edema.  Central airways: Patent  Effusions: Small pleural effusion on the left. Moderate pleural effusion on the right.  Lymphadenopathy: There is adenopathy with numerous nodes measuring up to 1.5 cm short axis in the prevascular space and right paratracheal space. There also is a subcarinal node measuring 2 cm in short axis.  Esophagus: Unremarkable  Upper abdomen: No significant abnormality  Musculoskeletal: No significant abnormality   Review of the MIP images confirms the above findings.  IMPRESSION: 1. Negative for acute pulmonary embolism 2. Interstitial fluid or thickening. Ground-glass opacities which may represent alveolar edema. This could represent congestive heart failure. 3. Mediastinal adenopathy.   Electronically Signed   By: Andreas Newport M.D.   On: 03/20/2015 04:19        Management plans discussed with the patient, family and they are in agreement.  CODE STATUS: Full code  TOTAL TIME TAKING CARE OF THIS PATIENT: 42 minutes.    Demetrios Loll M.D on 03/21/2015 at 3:27 PM  Between 7am to 6pm - Pager - [phone]  After 6pm go to [email or web address] - password [email or web address] [facility]  [facility]  Office  [phone]  CC: Primary care physician; No PCP Per Patient

## 2015-03-21 NOTE — Progress Notes (Signed)
SUBJECTIVE: Patient is feeling much better and denies any further chest pain.   Filed Vitals:   03/20/15 1158 03/20/15 1613 03/20/15 2029 03/21/15 0455  BP: 129/91 113/75 129/80 117/82  Pulse: 84 69 76 73  Temp: 98 F (36.7 C) 97.5 F (36.4 C) 98.1 F (36.7 C) 98.1 F (36.7 C)  TempSrc: Oral Oral Oral Oral  Resp: 18 18 18 18   Height:      Weight:    107.049 kg (236 lb)  SpO2: 98% 97% 97% 98%    Intake/Output Summary (Last 24 hours) at 03/21/15 0841 Last data filed at 03/21/15 0755  Gross per 24 hour  Intake    480 ml  Output   4550 ml  Net  -4070 ml    LABS: Basic Metabolic Panel:  Recent Labs  03/20/15 0142 03/21/15 0336  NA 139 141  K 3.7 3.6  CL 107 103  CO2 25 30  GLUCOSE 123* 114*  BUN 19 21*  CREATININE 0.93 0.98  CALCIUM 8.6* 8.6*  MG  --  1.8   Liver Function Tests: No results for input(s): AST, ALT, ALKPHOS, BILITOT, PROT, ALBUMIN in the last 72 hours. No results for input(s): LIPASE, AMYLASE in the last 72 hours. CBC:  Recent Labs  03/20/15 0142  WBC 12.0*  HGB 15.8  HCT 47.6  MCV 83.9  PLT 154   Cardiac Enzymes:  Recent Labs  03/20/15 0142 03/20/15 0755 03/20/15 1351  TROPONINI 0.07* 0.07* 0.05*   BNP: Invalid input(s): POCBNP D-Dimer: No results for input(s): DDIMER in the last 72 hours. Hemoglobin A1C:  Recent Labs  03/20/15 1351  HGBA1C 5.4   Fasting Lipid Panel:  Recent Labs  03/21/15 0336  CHOL 149  HDL 22*  LDLCALC 93  TRIG 170*  CHOLHDL 6.8   Thyroid Function Tests:  Recent Labs  03/20/15 0142  TSH 2.367   Anemia Panel: No results for input(s): VITAMINB12, FOLATE, FERRITIN, TIBC, IRON, RETICCTPCT in the last 72 hours.   PHYSICAL EXAM General: Well developed, well nourished, in no acute distress HEENT:  Normocephalic and atramatic Neck:  No JVD.  Lungs: Clear bilaterally to auscultation and percussion. Heart: HRRR . Normal S1 and S2 without gallops or murmurs.  Abdomen: Bowel sounds are  positive, abdomen soft and non-tender  Msk:  Back normal, normal gait. Normal strength and tone for age. Extremities: No clubbing, cyanosis or edema.   Neuro: Alert and oriented X 3. Psych:  Good affect, responds appropriately  TELEMETRY: Baseline is monitor shows sinus rhythm.  ASSESSMENT AND PLAN: CHF due to diastolic heart failure , patient is feeling much better with current treatment. He is no longer having chest pain. He may go home with follow-up in the office.  Principal Problem:   Acute CHF Active Problems:   Chest pain   Elevated troponin   CHF (congestive heart failure)    Dionisio David, MD, [facility] 03/21/2015 8:41 AM

## 2015-03-21 NOTE — Care Management (Addendum)
Patient's discharge meds are on the [facility] four dollar list and provided a coupon for Simvastatin for 7.81 for one month supply.  Patient says he will be able to pay for his meds.  Patient has appointment at [facility] schedule and informed him that an appointment has been made at [facility] for first available appointment per his request.  Says he will not see Dr Humphrey Rolls.  Patient verbalizes understanding of need to keep these appointments and that he is able to assume financial responsibility

## 2015-03-21 NOTE — Care Management (Signed)
Patient presented to ED with chest pain. Diagnosed with new onset heart failure.  Patient is self employed and "just has not gotten around to signing up for an insurance plan."  He has been assessed by cardiology but has stated that he does not want to be seen again by Dr Humphrey Rolls.  Primary nurse is aware.  Informed charge nurse that patient not happy with the cardiology eval.  Patient required iv morphine this morning for chest pain.  He does not have a PCP.  Made referral to [facility].  Will watch discharge meds to assure they will be affordable for this patient without insurance.  Discussed need to sign up for insurance plan

## 2015-03-31 ENCOUNTER — Encounter: Payer: Self-pay | Admitting: Family

## 2015-03-31 ENCOUNTER — Ambulatory Visit: Payer: Self-pay | Attending: Family | Admitting: Family

## 2015-03-31 VITALS — BP 130/90 | HR 79 | Resp 20 | Ht 72.0 in | Wt 231.0 lb

## 2015-03-31 DIAGNOSIS — F17210 Nicotine dependence, cigarettes, uncomplicated: Secondary | ICD-10-CM | POA: Insufficient documentation

## 2015-03-31 DIAGNOSIS — E669 Obesity, unspecified: Secondary | ICD-10-CM | POA: Insufficient documentation

## 2015-03-31 DIAGNOSIS — I5032 Chronic diastolic (congestive) heart failure: Secondary | ICD-10-CM | POA: Insufficient documentation

## 2015-03-31 DIAGNOSIS — I10 Essential (primary) hypertension: Secondary | ICD-10-CM | POA: Insufficient documentation

## 2015-03-31 DIAGNOSIS — Z6830 Body mass index (BMI) 30.0-30.9, adult: Secondary | ICD-10-CM | POA: Insufficient documentation

## 2015-03-31 DIAGNOSIS — Z79899 Other long term (current) drug therapy: Secondary | ICD-10-CM | POA: Insufficient documentation

## 2015-03-31 DIAGNOSIS — R7309 Other abnormal glucose: Secondary | ICD-10-CM | POA: Insufficient documentation

## 2015-03-31 DIAGNOSIS — Z7982 Long term (current) use of aspirin: Secondary | ICD-10-CM | POA: Insufficient documentation

## 2015-03-31 DIAGNOSIS — F17200 Nicotine dependence, unspecified, uncomplicated: Secondary | ICD-10-CM

## 2015-03-31 DIAGNOSIS — L409 Psoriasis, unspecified: Secondary | ICD-10-CM | POA: Insufficient documentation

## 2015-03-31 DIAGNOSIS — R0602 Shortness of breath: Secondary | ICD-10-CM | POA: Insufficient documentation

## 2015-03-31 IMAGING — CR DG CHEST 2V
1 series · 1 of 1 positions shown · non-contrast
Comparison: Chest x-ray 08/26/2012.

CLINICAL DATA: Coughing, congestion. History of smoking. Chest
pain.

EXAM:
CHEST  2 VIEW

[lat]
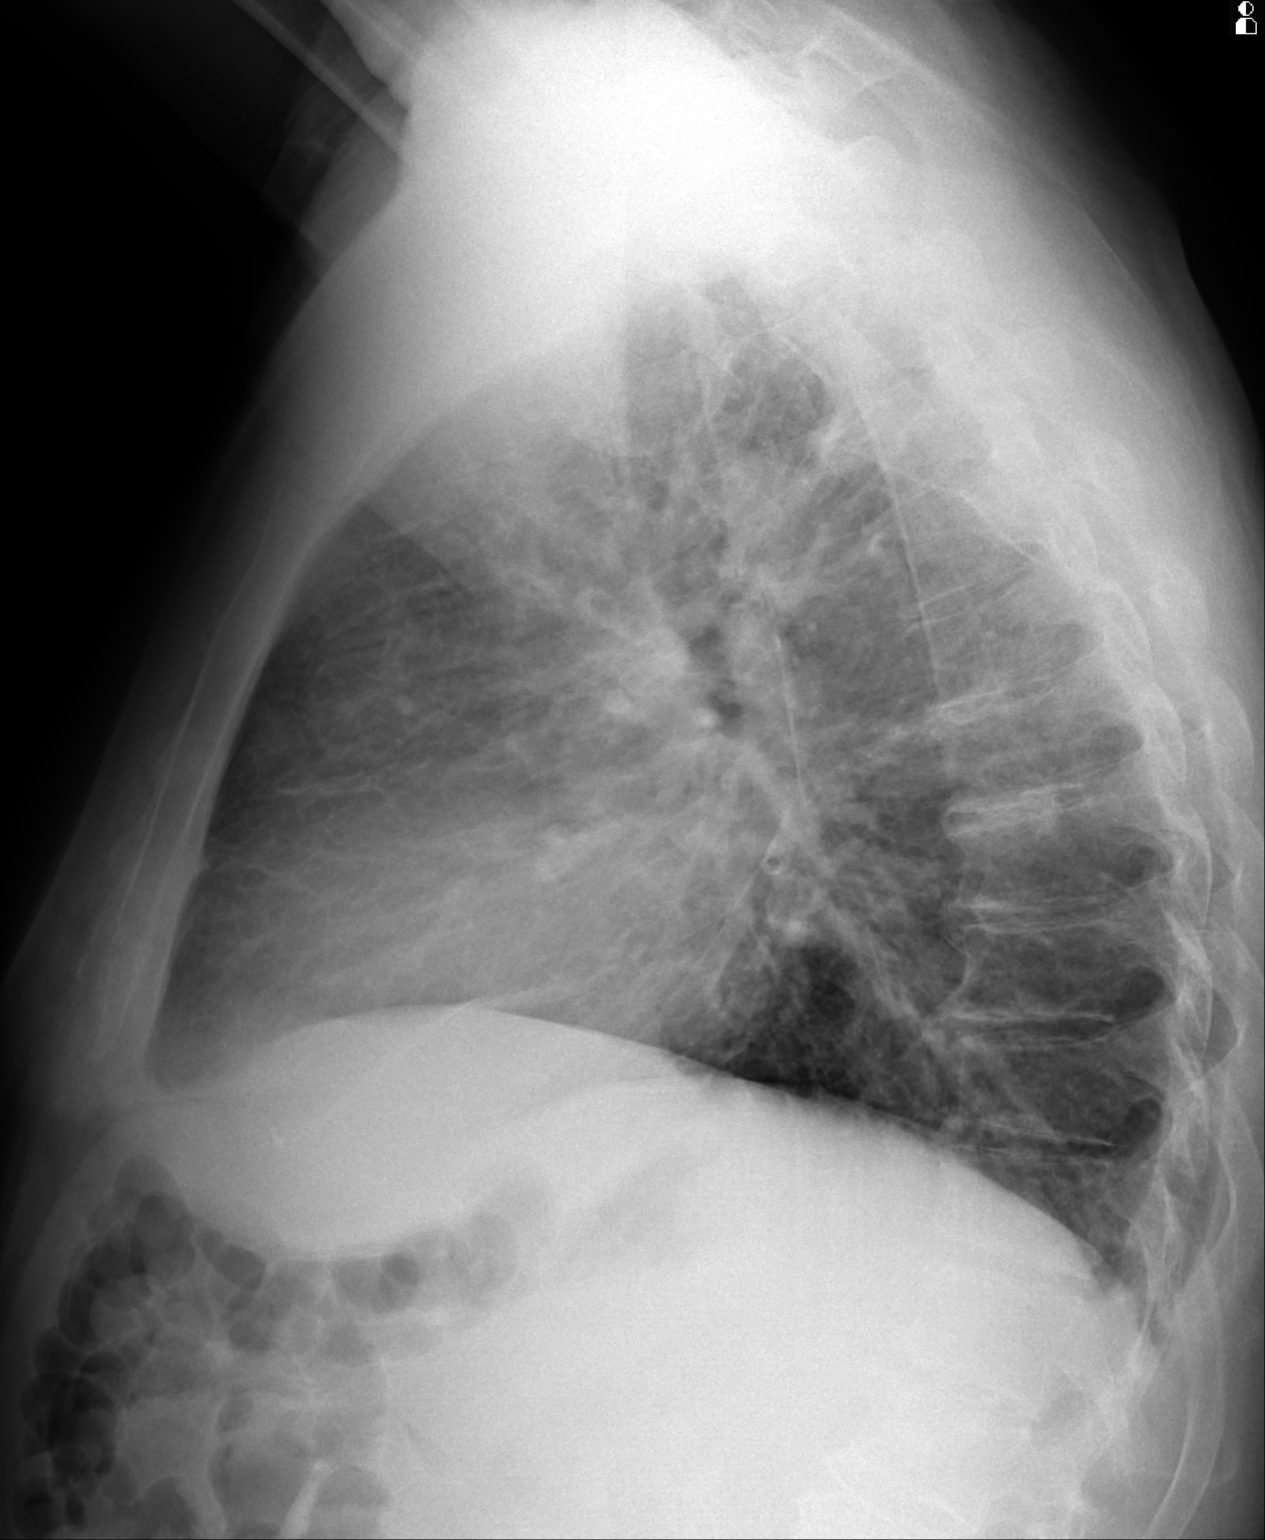

[1 of 1 positions shown; findings below may reference images not displayed]

FINDINGS: Diffuse peribronchial coughing. Ill-defined right infrahilar
opacity. 1.2 cm nodular opacity in the periphery of the right lung
base identified only on the frontal projection. No pleural
effusions. Pulmonary vasculature is normal. Heart size is within
normal limits. Upper mediastinal contours are within normal limits.
IMPRESSION: 1. Mild diffuse peribronchial cuffing concerning for an acute
bronchitis. Hazy ill-defined right infrahilar opacity is concerning
for developing bronchopneumonia. Repeat radiographs in 2-3 weeks
following appropriate trial of antimicrobial therapy is recommended
to ensure resolution of these findings.
2. 1.2 cm dense nodular opacity projecting over the right lung base.
While this could represent a nodule in the right middle lobe or
right lower lobe, this may alternatively represent a bone island in
the anterior aspect of the right sixth rib, or a prominent
costochondral cartilage calcification. Attention on the short-term
follow-up standing PA and lateral chest radiograph is recommended.

## 2015-03-31 NOTE — Patient Instructions (Addendum)
Continue weighing daily and call for an overnight weight gain of > 2 pounds or a weekly weight gain of >5 pounds.  1.800.QUIT.NOW for smoking assistance.  Call [facility] and [facility] for assistance.     Low-Sodium Eating Plan Sodium raises blood pressure and causes water to be held in the body. Getting less sodium from food will help lower your blood pressure, reduce any swelling, and protect your heart, liver, and kidneys. We get sodium by adding salt (sodium chloride) to food. Most of our sodium comes from canned, boxed, and frozen foods. Restaurant foods, fast foods, and pizza are also very high in sodium. Even if you take medicine to lower your blood pressure or to reduce fluid in your body, getting less sodium from your food is important. WHAT IS MY PLAN? Most people should limit their sodium intake to 2,300 mg a day. Your health care provider recommends that you limit your sodium intake to _2000mg  a day.  WHAT DO I NEED TO KNOW ABOUT THIS EATING PLAN? For the low-sodium eating plan, you will follow these general guidelines:  Choose foods with a % Daily Value for sodium of less than 5% (as listed on the food label).   Use salt-free seasonings or herbs instead of table salt or sea salt.   Check with your health care provider or pharmacist before using salt substitutes.   Eat fresh foods.  Eat more vegetables and fruits.  Limit canned vegetables. If you do use them, rinse them well to decrease the sodium.   Limit cheese to 1 oz (28 g) per day.   Eat lower-sodium products, often labeled as "lower sodium" or "no salt added."  Avoid foods that contain monosodium glutamate (MSG). MSG is sometimes added to [place] food and some canned foods.  Check food labels (Nutrition Facts labels) on foods to learn how much sodium is in one serving.  Eat more home-cooked food and less restaurant, buffet, and fast food.  When eating at a restaurant, ask that  your food be prepared with less salt or none, if possible.  HOW DO I READ FOOD LABELS FOR SODIUM INFORMATION? The Nutrition Facts label lists the amount of sodium in one serving of the food. If you eat more than one serving, you must multiply the listed amount of sodium by the number of servings. Food labels may also identify foods as:  Sodium free--Less than 5 mg in a serving.  Very low sodium--35 mg or less in a serving.  Low sodium--140 mg or less in a serving.  Light in sodium--50% less sodium in a serving. For example, if a food that usually has 300 mg of sodium is changed to become light in sodium, it will have 150 mg of sodium.  Reduced sodium--25% less sodium in a serving. For example, if a food that usually has 400 mg of sodium is changed to reduced sodium, it will have 300 mg of sodium. WHAT FOODS CAN I EAT? Grains Low-sodium cereals, including oats, puffed wheat and rice, and shredded wheat cereals. Low-sodium crackers. Unsalted rice and pasta. Lower-sodium bread.  Vegetables Frozen or fresh vegetables. Low-sodium or reduced-sodium canned vegetables. Low-sodium or reduced-sodium tomato sauce and paste. Low-sodium or reduced-sodium tomato and vegetable juices.  Fruits Fresh, frozen, and canned fruit. Fruit juice.  Meat and Other Protein Products Low-sodium canned tuna and salmon. Fresh or frozen meat, poultry, seafood, and fish. Lamb. Unsalted nuts. Dried beans, peas, and lentils without added salt. Unsalted canned beans. Homemade  soups without salt. Eggs.  Dairy Milk. Soy milk. Ricotta cheese. Low-sodium or reduced-sodium cheeses. Yogurt.  Condiments Fresh and dried herbs and spices. Salt-free seasonings. Onion and garlic powders. Low-sodium varieties of mustard and ketchup. Lemon juice.  Fats and Oils Reduced-sodium salad dressings. Unsalted butter.  Other Unsalted popcorn and pretzels.  The items listed above may not be a complete list of recommended foods or  beverages. Contact your dietitian for more options. WHAT FOODS ARE NOT RECOMMENDED? Grains Instant hot cereals. Bread stuffing, pancake, and biscuit mixes. Croutons. Seasoned rice or pasta mixes. Noodle soup cups. Boxed or frozen macaroni and cheese. Self-rising flour. Regular salted crackers. Vegetables Regular canned vegetables. Regular canned tomato sauce and paste. Regular tomato and vegetable juices. Frozen vegetables in sauces. Salted french fries. Olives. Angie Fava. Relishes. Sauerkraut. Salsa. Meat and Other Protein Products Salted, canned, smoked, spiced, or pickled meats, seafood, or fish. Bacon, ham, sausage, hot dogs, corned beef, chipped beef, and packaged luncheon meats. Salt pork. Jerky. Pickled herring. Anchovies, regular canned tuna, and sardines. Salted nuts. Dairy Processed cheese and cheese spreads. Cheese curds. Blue cheese and cottage cheese. Buttermilk.  Condiments Onion and garlic salt, seasoned salt, table salt, and sea salt. Canned and packaged gravies. Worcestershire sauce. Tartar sauce. Barbecue sauce. Teriyaki sauce. Soy sauce, including reduced sodium. Steak sauce. Fish sauce. Oyster sauce. Cocktail sauce. Horseradish. Regular ketchup and mustard. Meat flavorings and tenderizers. Bouillon cubes. Hot sauce. Tabasco sauce. Marinades. Taco seasonings. Relishes. Fats and Oils Regular salad dressings. Salted butter. Margarine. Ghee. Bacon fat.  Other Potato and tortilla chips. Corn chips and puffs. Salted popcorn and pretzels. Canned or dried soups. Pizza. Frozen entrees and pot pies.  The items listed above may not be a complete list of foods and beverages to avoid. Contact your dietitian for more information. Document Released: 01/25/2002 Document Revised: 08/10/2013 Document Reviewed: 06/09/2013 [facility] Patient Information 2015 [place], [place]. This information is not intended to replace advice given to you by your health care provider. Make sure you discuss any  questions you have with your health care provider.

## 2015-03-31 NOTE — Progress Notes (Signed)
Subjective:    Patient ID: Gabriel [name]., male    DOB: Oct 26, 1970, 44 y.o.   MRN: [id number]  Congestive Heart Failure Presents for initial visit. The disease course has been improving. Associated symptoms include abdominal pain (intermittent), fatigue (improving), palpitations and shortness of breath (mild and improving). Pertinent negatives include no chest pain, edema or orthopnea. The symptoms have been improving. Past treatments include beta blockers, ACE inhibitors and salt and fluid restriction. The treatment provided moderate relief. Compliance with prior treatments has been good. His past medical history is significant for HTN. There is no history of CAD. Compliance with total regimen is 76-100%.  Hypertension This is a recurrent problem. The current episode started more than 1 year ago. The problem has been waxing and waning since onset. The problem is uncontrolled. Associated symptoms include headaches (mild), malaise/fatigue, palpitations and shortness of breath (mild and improving). Pertinent negatives include no anxiety, blurred vision, chest pain, neck pain or peripheral edema. There are no associated agents to hypertension. Risk factors for coronary artery disease include male gender and smoking/tobacco exposure. Past treatments include beta blockers, ACE inhibitors and diuretics. The current treatment provides moderate improvement. Compliance problems include medication cost.  Hypertensive end-organ damage includes heart failure. There is no history of CVA.    Past Medical History  Diagnosis Date  . Hypertension     Resolved since weight loss  . Obesity (BMI 30.0-34.9)   . Psoriasis   . Diabetes mellitus without complication     Pre diabetic    Past Surgical History  Procedure Laterality Date  . Amputation    . Finger amputation      Traumatic    Family History  Problem Relation Age of Onset  . Diabetes Mellitus II Mother   . Hypertension Father   . Cancer  Paternal Aunt   . Cancer Maternal Grandfather     Social History  Substance Use Topics  . Smoking status: Current Every Day Smoker -- 0.25 packs/day for 33 years    Types: Cigarettes  . Smokeless tobacco: Not on file  . Alcohol Use: 1.8 oz/week    0 Standard drinks or equivalent, 3 Cans of beer per week    Allergies  Allergen Reactions  . Prednisone Other (See Comments)    Reaction: Hallucinations    Prior to Admission medications   Medication Sig Start Date End Date Taking? Authorizing Provider  aspirin EC 81 MG tablet Take 1 tablet (81 mg total) by mouth daily. 03/21/15  Yes Demetrios Loll, MD  carvedilol (COREG) 12.5 MG tablet Take 1 tablet (12.5 mg total) by mouth 2 (two) times daily with a meal. 03/21/15  Yes Demetrios Loll, MD  furosemide (LASIX) 40 MG tablet Take 1 tablet (40 mg total) by mouth 2 (two) times daily. 03/21/15  Yes Demetrios Loll, MD  lisinopril (PRINIVIL,ZESTRIL) 10 MG tablet Take 1 tablet (10 mg total) by mouth daily. 03/21/15  Yes Demetrios Loll, MD  simvastatin (ZOCOR) 40 MG tablet Take 1 tablet (40 mg total) by mouth at bedtime. 03/21/15  Yes Demetrios Loll, MD     Review of Systems  Constitutional: Positive for malaise/fatigue, appetite change (decreased) and fatigue (improving).  HENT: Negative for congestion, postnasal drip and sore throat.   Eyes: Negative.  Negative for blurred vision.  Respiratory: Positive for cough (improving) and shortness of breath (mild and improving). Negative for chest tightness and wheezing.   Cardiovascular: Positive for palpitations. Negative for chest pain and leg swelling.  Gastrointestinal:  Positive for abdominal pain (intermittent). Negative for abdominal distention.  Endocrine: Negative.   Genitourinary: Negative.   Musculoskeletal: Negative for back pain and neck pain.  Skin: Negative.   Allergic/Immunologic: Negative.   Neurological: Positive for headaches (mild). Negative for dizziness and light-headedness.  Hematological: Negative for  adenopathy. Bruises/bleeds easily.  Psychiatric/Behavioral: Negative for sleep disturbance (sleeping on 2 pillows) and dysphoric mood. The patient is not nervous/anxious.        Objective:   Physical Exam  Constitutional: He is oriented to person, place, and time. He appears well-developed and well-nourished.  HENT:  Head: Normocephalic and atraumatic.  Eyes: Conjunctivae are normal. Pupils are equal, round, and reactive to light.  Neck: Normal range of motion. Neck supple.  Cardiovascular: Normal rate and regular rhythm.   Pulmonary/Chest: Effort normal. He has no wheezes. He has no rales.  Abdominal: Soft. He exhibits distension. There is no tenderness.  Musculoskeletal: He exhibits no edema or tenderness.  Neurological: He is alert and oriented to person, place, and time.  Skin: Skin is warm and dry.  Psychiatric: He has a normal mood and affect. His behavior is normal. Thought content normal.  Nursing note and vitals reviewed.   BP 130/90 mmHg  Pulse 79  Resp 20  Ht 6' (1.829 m)  Wt 231 lb (104.781 kg)  BMI 31.32 kg/m2  SpO2 100%       Assessment & Plan:  1: Chronic heart failure with preserved ejection fraction- Patient presents with improving fatigue as well as improving shortness of breath. He says that his swelling has completely resolved and his weight has been good. He says that he's not weighing himself daily and he was instructed to begin weighing daily first thing in the morning after using the bathroom. He's to call for an overnight weight gain of >2 pounds or a weekly weight gain of >5 pounds. He is not adding any salt to his food. Reviewed how to read food labels and the importance of following a 2000mg  sodium diet. Written information was given to patient. He says that he's now only taking his furosemide daily instead of twice daily because his edema resolved completely. Continue taking it daily for now and should his weight go up per parameters above, he can take a  2nd dose of furosemide.  2: HTN- Blood pressure slightly improved upon recheck. Continue to monitor and adjust medications accordingly. 3: Tobacco use- Discussed smoking cessation for 4 minutes with patient. He says that he's quit in the past and is decreasing his usage. Smoking cessation number was provided to him.  4: Health maintenance- Patient is currently without insurance and therefore without a PCP. Phone numbers to [facility] as well as [facility] were given to him and he was encouraged to call them to get established with one of them. Also gave him information on the [facility] for medication assistance. Currently his medications are on the $4.00 list at [facility]. Does have an appointment with Dr. Ellyn Hack on 04/26/15 which he was unaware that he had.   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 Note given to him to return. Did discuss that he might have to rest often during the day and patient says that he is aware of that but financially he can't afford to stay out of work.   Return in 1 month or sooner for any questions/problems before then.

## 2015-04-12 ENCOUNTER — Encounter: Payer: Self-pay | Admitting: Family

## 2015-04-12 ENCOUNTER — Ambulatory Visit: Payer: MEDICAID | Attending: Family | Admitting: Family

## 2015-04-12 VITALS — BP 176/121 | HR 82 | Resp 20 | Ht 72.0 in | Wt 244.0 lb

## 2015-04-12 DIAGNOSIS — Z6830 Body mass index (BMI) 30.0-30.9, adult: Secondary | ICD-10-CM | POA: Insufficient documentation

## 2015-04-12 DIAGNOSIS — I5032 Chronic diastolic (congestive) heart failure: Secondary | ICD-10-CM | POA: Insufficient documentation

## 2015-04-12 DIAGNOSIS — Z79899 Other long term (current) drug therapy: Secondary | ICD-10-CM | POA: Insufficient documentation

## 2015-04-12 DIAGNOSIS — R7309 Other abnormal glucose: Secondary | ICD-10-CM | POA: Insufficient documentation

## 2015-04-12 DIAGNOSIS — E669 Obesity, unspecified: Secondary | ICD-10-CM | POA: Insufficient documentation

## 2015-04-12 DIAGNOSIS — Z7982 Long term (current) use of aspirin: Secondary | ICD-10-CM | POA: Insufficient documentation

## 2015-04-12 DIAGNOSIS — L409 Psoriasis, unspecified: Secondary | ICD-10-CM | POA: Insufficient documentation

## 2015-04-12 DIAGNOSIS — F17210 Nicotine dependence, cigarettes, uncomplicated: Secondary | ICD-10-CM | POA: Insufficient documentation

## 2015-04-12 DIAGNOSIS — I10 Essential (primary) hypertension: Secondary | ICD-10-CM

## 2015-04-12 DIAGNOSIS — R079 Chest pain, unspecified: Secondary | ICD-10-CM | POA: Insufficient documentation

## 2015-04-12 DIAGNOSIS — R0789 Other chest pain: Secondary | ICD-10-CM

## 2015-04-12 DIAGNOSIS — F17200 Nicotine dependence, unspecified, uncomplicated: Secondary | ICD-10-CM

## 2015-04-12 NOTE — Progress Notes (Signed)
Subjective:    Patient ID: Gabriel [name]., male    DOB: 11-14-1970, 44 y.o.   MRN: [id number]  Congestive Heart Failure Presents for follow-up visit. The disease course has been stable. Associated symptoms include chest pressure (yesterday), edema and fatigue. Pertinent negatives include no abdominal pain, chest pain, orthopnea, palpitations, paroxysmal nocturnal dyspnea or shortness of breath. The symptoms have been stable. Past treatments include ACE inhibitors, beta blockers and salt and fluid restriction. The treatment provided moderate relief. Compliance with prior treatments has been good. His past medical history is significant for HTN. Compliance with total regimen is 76-100%.  Chest Pain  This is a recurrent problem. The current episode started yesterday. The onset quality is sudden. The problem occurs rarely. The problem has been resolved. The pain is present in the epigastric region. The pain is at a severity of 5/10. The pain is moderate. The quality of the pain is described as pressure. The pain does not radiate. Associated symptoms include a cough (when laying on his back in the bed), headaches (relieved with tylenol), lower extremity edema and malaise/fatigue. Pertinent negatives include no abdominal pain, back pain, dizziness, irregular heartbeat, nausea, palpitations, shortness of breath, syncope, vomiting or weakness. Cough associated with: when lay down at night. The cough is non-productive. There is no color change associated with the cough. Cough relieved by: laying on side instead of back. The cough is worsened by a supine position. Risk factors include male gender and smoking/tobacco exposure.  His past medical history is significant for CHF, HTN and hypertension.  Pertinent negatives for past medical history include no diabetes and no MI.  His family medical history is significant for diabetes and hypertension.   Past Medical History  Diagnosis Date  . Hypertension    Resolved since weight loss  . Obesity (BMI 30.0-34.9)   . Psoriasis   . Diabetes mellitus without complication     Pre diabetic    Past Surgical History  Procedure Laterality Date  . Amputation    . Finger amputation      Traumatic    Family History  Problem Relation Age of Onset  . Diabetes Mellitus II Mother   . Hypertension Father   . Cancer Paternal Aunt   . Cancer Maternal Grandfather    Social History  Substance Use Topics  . Smoking status: Current Every Day Smoker -- 0.25 packs/day for 33 years    Types: Cigarettes  . Smokeless tobacco: Not on file  . Alcohol Use: 1.8 oz/week    0 Standard drinks or equivalent, 3 Cans of beer per week    Allergies  Allergen Reactions  . Prednisone Other (See Comments)    Reaction: Hallucinations     Prior to Admission medications   Medication Sig Start Date End Date Taking? Authorizing Provider  aspirin EC 81 MG tablet Take 1 tablet (81 mg total) by mouth daily. 03/21/15  Yes Demetrios Loll, MD  carvedilol (COREG) 12.5 MG tablet Take 1 tablet (12.5 mg total) by mouth 2 (two) times daily with a meal. 03/21/15  Yes Demetrios Loll, MD  furosemide (LASIX) 40 MG tablet Take 1 tablet (40 mg total) by mouth 2 (two) times daily. 03/21/15  Yes Demetrios Loll, MD  lisinopril (PRINIVIL,ZESTRIL) 10 MG tablet Take 1 tablet (10 mg total) by mouth daily. 03/21/15  Yes Demetrios Loll, MD  simvastatin (ZOCOR) 40 MG tablet Take 1 tablet (40 mg total) by mouth at bedtime. 03/21/15  Yes Demetrios Loll, MD  Review of Systems  Constitutional: Positive for malaise/fatigue and fatigue. Negative for appetite change.  HENT: Positive for postnasal drip. Negative for congestion and sore throat.   Eyes: Negative.   Respiratory: Positive for cough (when laying on his back in the bed). Negative for chest tightness, shortness of breath and wheezing.   Cardiovascular: Positive for leg swelling (little in left ankle). Negative for chest pain, palpitations and syncope.  Gastrointestinal:  Positive for abdominal distention. Negative for nausea, vomiting and abdominal pain.  Endocrine: Negative.   Genitourinary: Negative.   Musculoskeletal: Negative for back pain and neck pain.  Skin: Negative.   Allergic/Immunologic: Negative.   Neurological: Positive for headaches (relieved with tylenol). Negative for dizziness, weakness and light-headedness.  Hematological: Negative for adenopathy. Does not bruise/bleed easily.  Psychiatric/Behavioral: Negative for sleep disturbance (sleeping on 2 pillows) and dysphoric mood. The patient is not nervous/anxious.        Objective:   Physical Exam  Constitutional: He is oriented to person, place, and time. He appears well-developed and well-nourished.  HENT:  Head: Normocephalic and atraumatic.  Eyes: Conjunctivae are normal. Pupils are equal, round, and reactive to light.  Neck: Normal range of motion. Neck supple.  Cardiovascular: Normal rate and regular rhythm.   Pulmonary/Chest: Effort normal. No respiratory distress. He has no wheezes. He has no rales.  Abdominal: Soft. He exhibits distension. There is no tenderness.  Musculoskeletal: He exhibits edema (trace in bilateral ankles). He exhibits no tenderness.  Neurological: He is alert and oriented to person, place, and time.  Skin: Skin is warm and dry.  Psychiatric: He has a normal mood and affect. His behavior is normal. Thought content normal.  Nursing note and vitals reviewed.   BP 176/121 mmHg  Pulse 82  Resp 20  Ht 6' (1.829 m)  Wt 244 lb (110.678 kg)  BMI 33.09 kg/m2  SpO2 99%       Assessment & Plan:  1: Chest pain- Patient presents today as a walk-in due to an episode of chest pain that he had at work yesterday. He is working now at [employer] and has been since last week without any health issues. Yesterday, he was pulling on a cord to start a lawnmower when he develop some chest pressure directly over his mid-epigastric area. No radiation and no nausea, sweating,  flushing or numbness anywhere. Did have some shortness of breath with this. Lasted for a "few minutes" and resolved on its own. Blood pressure was high as he hadn't taken his medication yet before this happened. Was sent home and he's had no further episodes of chest pain or shortness of breath since. Had been doing this job since last week and this was the only time it had happened. Wanted to do an EKG but patient said his ride had an appointment to go to as well and he really didn't have time. Understands the importance of doing one if this happens again and that I really wanted to do one today but he declines. 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 Discussed how he may need to take his medication prior to going to work instead of waiting until his break around 8:30am.  2: HTN- Blood pressure elevated here and it was again on recheck. However, he says that he hasn't taken his medication yet but will do so as soon as he gets home. He says that he checks it at home and after he takes his medication, it will be mid 140's/mid80's. Instructed  him to write down his blood pressure readings and bring them next time he comes. BP at work yesterday was 146/110 (per patient). 3: Chronic heart failure with preserved ejection fraction- No more shortness of breath since yesterday. Little bit of edema in his ankles so he had resumed taking his furosemide twice daily. Weight is up 13 pounds since he was here last but he says that he's eating more as his appetite has returned. Not adding salt and trying to follow a low sodium diet. Reminded to call for an overnight weight gain of >2 pounds or a weekly weight gain of >5 pounds.  4: Tobacco use- Patient continues to smoke. Cessation discussed for 2 minutes and he says that he's going to try and quit on his own.   Return in a couple of weeks or sooner for any questions/problems before then. Will need to followup to see if he's contacted [facility] and medication  management clinic.

## 2015-04-12 NOTE — Patient Instructions (Signed)
Continue weighing daily and call for an overnight weight gain of > 2 pounds or a weekly weight gain of >5 pounds.    Smoking Cessation Quitting smoking is important to your health and has many advantages. However, it is not always easy to quit since nicotine is a very addictive drug. Oftentimes, people try 3 times or more before being able to quit. This document explains the best ways for you to prepare to quit smoking. Quitting takes hard work and a lot of effort, but you can do it. ADVANTAGES OF QUITTING SMOKING  You will live longer, feel better, and live better.  Your body will feel the impact of quitting smoking almost immediately.  Within 20 minutes, blood pressure decreases. Your pulse returns to its normal level.  After 8 hours, carbon monoxide levels in the blood return to normal. Your oxygen level increases.  After 24 hours, the chance of having a heart attack starts to decrease. Your breath, hair, and body stop smelling like smoke.  After 48 hours, damaged nerve endings begin to recover. Your sense of taste and smell improve.  After 72 hours, the body is virtually free of nicotine. Your bronchial tubes relax and breathing becomes easier.  After 2 to 12 weeks, lungs can hold more air. Exercise becomes easier and circulation improves.  The risk of having a heart attack, stroke, cancer, or lung disease is greatly reduced.  After 1 year, the risk of coronary heart disease is cut in half.  After 5 years, the risk of stroke falls to the same as a nonsmoker.  After 10 years, the risk of lung cancer is cut in half and the risk of other cancers decreases significantly.  After 15 years, the risk of coronary heart disease drops, usually to the level of a nonsmoker.  If you are pregnant, quitting smoking will improve your chances of having a healthy baby.  The people you live with, especially any children, will be healthier.  You will have extra money to spend on things other  than cigarettes. QUESTIONS TO THINK ABOUT BEFORE ATTEMPTING TO QUIT You may want to talk about your answers with your health care provider.  Why do you want to quit?  If you tried to quit in the past, what helped and what did not?  What will be the most difficult situations for you after you quit? How will you plan to handle them?  Who can help you through the tough times? Your family? Friends? A health care provider?  What pleasures do you get from smoking? What ways can you still get pleasure if you quit? Here are some questions to ask your health care provider:  How can you help me to be successful at quitting?  What medicine do you think would be best for me and how should I take it?  What should I do if I need more help?  What is smoking withdrawal like? How can I get information on withdrawal? GET READY  Set a quit date.  Change your environment by getting rid of all cigarettes, ashtrays, matches, and lighters in your home, car, or work. Do not let people smoke in your home.  Review your past attempts to quit. Think about what worked and what did not. GET SUPPORT AND ENCOURAGEMENT You have a better chance of being successful if you have help. You can get support in many ways.  Tell your family, friends, and coworkers that you are going to quit and need their support. Ask   them not to smoke around you.  Get individual, group, or telephone counseling and support. Programs are available at local hospitals and health centers. Call your local health department for information about programs in your area.  Spiritual beliefs and practices may help some smokers quit.  Download a "quit meter" on your computer to keep track of quit statistics, such as how long you have gone without smoking, cigarettes not smoked, and money saved.  Get a self-help book about quitting smoking and staying off tobacco. LEARN NEW SKILLS AND BEHAVIORS  Distract yourself from urges to smoke. Talk to  someone, go for a walk, or occupy your time with a task.  Change your normal routine. Take a different route to work. Drink tea instead of coffee. Eat breakfast in a different place.  Reduce your stress. Take a hot bath, exercise, or read a book.  Plan something enjoyable to do every day. Reward yourself for not smoking.  Explore interactive web-based programs that specialize in helping you quit. GET MEDICINE AND USE IT CORRECTLY Medicines can help you stop smoking and decrease the urge to smoke. Combining medicine with the above behavioral methods and support can greatly increase your chances of successfully quitting smoking.  Nicotine replacement therapy helps deliver nicotine to your body without the negative effects and risks of smoking. Nicotine replacement therapy includes nicotine gum, lozenges, inhalers, nasal sprays, and skin patches. Some may be available over-the-counter and others require a prescription.  Antidepressant medicine helps people abstain from smoking, but how this works is unknown. This medicine is available by prescription.  Nicotinic receptor partial agonist medicine simulates the effect of nicotine in your brain. This medicine is available by prescription. Ask your health care provider for advice about which medicines to use and how to use them based on your health history. Your health care provider will tell you what side effects to look out for if you choose to be on a medicine or therapy. Carefully read the information on the package. Do not use any other product containing nicotine while using a nicotine replacement product.  RELAPSE OR DIFFICULT SITUATIONS Most relapses occur within the first 3 months after quitting. Do not be discouraged if you start smoking again. Remember, most people try several times before finally quitting. You may have symptoms of withdrawal because your body is used to nicotine. You may crave cigarettes, be irritable, feel very hungry, cough  often, get headaches, or have difficulty concentrating. The withdrawal symptoms are only temporary. They are strongest when you first quit, but they will go away within 10-14 days. To reduce the chances of relapse, try to:  Avoid drinking alcohol. Drinking lowers your chances of successfully quitting.  Reduce the amount of caffeine you consume. Once you quit smoking, the amount of caffeine in your body increases and can give you symptoms, such as a rapid heartbeat, sweating, and anxiety.  Avoid smokers because they can make you want to smoke.  Do not let weight gain distract you. Many smokers will gain weight when they quit, usually less than 10 pounds. Eat a healthy diet and stay active. You can always lose the weight gained after you quit.  Find ways to improve your mood other than smoking. FOR MORE INFORMATION  www.smokefree.gov  Document Released: 07/30/2001 Document Revised: 12/20/2013 Document Reviewed: 11/14/2011 [employer] Patient Information 2015 [employer]. This information is not intended to replace advice given to you by your health care provider. Make sure you discuss any questions you have with your   health care provider.  

## 2015-04-26 ENCOUNTER — Ambulatory Visit: Payer: Self-pay | Admitting: Cardiology

## 2015-04-26 ENCOUNTER — Encounter: Payer: Self-pay | Admitting: *Deleted

## 2015-04-27 ENCOUNTER — Encounter: Payer: Self-pay | Admitting: Emergency Medicine

## 2015-04-27 ENCOUNTER — Emergency Department: Payer: Self-pay

## 2015-04-27 ENCOUNTER — Emergency Department
Admission: EM | Admit: 2015-04-27 | Discharge: 2015-04-27 | Disposition: A | Discharge status: Home / Self Care | Payer: Self-pay | Attending: Emergency Medicine | Admitting: Emergency Medicine

## 2015-04-27 DIAGNOSIS — I10 Essential (primary) hypertension: Secondary | ICD-10-CM | POA: Insufficient documentation

## 2015-04-27 DIAGNOSIS — E119 Type 2 diabetes mellitus without complications: Secondary | ICD-10-CM | POA: Insufficient documentation

## 2015-04-27 DIAGNOSIS — J209 Acute bronchitis, unspecified: Secondary | ICD-10-CM | POA: Insufficient documentation

## 2015-04-27 DIAGNOSIS — Z7982 Long term (current) use of aspirin: Secondary | ICD-10-CM | POA: Insufficient documentation

## 2015-04-27 DIAGNOSIS — J40 Bronchitis, not specified as acute or chronic: Secondary | ICD-10-CM

## 2015-04-27 DIAGNOSIS — Z720 Tobacco use: Secondary | ICD-10-CM | POA: Insufficient documentation

## 2015-04-27 DIAGNOSIS — Z79899 Other long term (current) drug therapy: Secondary | ICD-10-CM | POA: Insufficient documentation

## 2015-04-27 LAB — CBC
HCT: 48.2 % (ref 40.0–52.0)
Hemoglobin: 16 g/dL (ref 13.0–18.0)
MCH: 27.9 pg (ref 26.0–34.0)
MCHC: 33.1 g/dL (ref 32.0–36.0)
MCV: 84.2 fL (ref 80.0–100.0)
Platelets: 148 10*3/uL — ABNORMAL LOW (ref 150–440)
RBC: 5.73 MIL/uL (ref 4.40–5.90)
RDW: 15.5 % — ABNORMAL HIGH (ref 11.5–14.5)
WBC: 13.9 10*3/uL — ABNORMAL HIGH (ref 3.8–10.6)

## 2015-04-27 LAB — COMPREHENSIVE METABOLIC PANEL
ALT: 23 U/L (ref 17–63)
AST: 30 U/L (ref 15–41)
Albumin: 3.6 g/dL (ref 3.5–5.0)
Alkaline Phosphatase: 74 U/L (ref 38–126)
Anion gap: 9 (ref 5–15)
BUN: 10 mg/dL (ref 6–20)
CO2: 24 mmol/L (ref 22–32)
Calcium: 8.8 mg/dL — ABNORMAL LOW (ref 8.9–10.3)
Chloride: 104 mmol/L (ref 101–111)
Creatinine, Ser: 0.85 mg/dL (ref 0.61–1.24)
GFR calc Af Amer: >60 mL/min (ref >60)
GFR calc non Af Amer: >60 mL/min (ref >60)
Glucose, Bld: 152 mg/dL — ABNORMAL HIGH (ref 65–99)
Potassium: 3.9 mmol/L (ref 3.5–5.1)
Sodium: 137 mmol/L (ref 135–145)
Total Bilirubin: 4.7 mg/dL — ABNORMAL HIGH (ref 0.3–1.2)
Total Protein: 6.7 g/dL (ref 6.5–8.1)

## 2015-04-27 MED ORDER — IPRATROPIUM-ALBUTEROL 0.5-2.5 (3) MG/3ML IN SOLN
3.0000 mL | Freq: Once | RESPIRATORY_TRACT | Status: AC
  Administered 2015-04-27: 3 mL via RESPIRATORY_TRACT
  Filled 2015-04-27: qty 3

## 2015-04-27 MED ORDER — BENZONATATE 100 MG PO CAPS: 100.0000 mg | ORAL_CAPSULE | Freq: Three times a day (TID) | ORAL | Status: DC | PRN

## 2015-04-27 MED ORDER — CEFDINIR 300 MG PO CAPS: 300.0000 mg | ORAL_CAPSULE | Freq: Two times a day (BID) | ORAL | Status: AC

## 2015-04-27 MED ORDER — KETOROLAC TROMETHAMINE 30 MG/ML IJ SOLN
30.0000 mg | Freq: Once | INTRAMUSCULAR | Status: AC
  Administered 2015-04-27: 30 mg via INTRAVENOUS

## 2015-04-27 MED ORDER — IPRATROPIUM-ALBUTEROL 0.5-2.5 (3) MG/3ML IN SOLN
3.0000 mL | Freq: Once | RESPIRATORY_TRACT | Status: AC
  Administered 2015-04-27: 3 mL via RESPIRATORY_TRACT

## 2015-04-27 MED ORDER — ALBUTEROL SULFATE HFA 108 (90 BASE) MCG/ACT IN AERS: 2.0000 | INHALATION_SPRAY | Freq: Four times a day (QID) | RESPIRATORY_TRACT | Status: DC | PRN

## 2015-04-27 MED ORDER — KETOROLAC TROMETHAMINE 30 MG/ML IJ SOLN
INTRAMUSCULAR | Status: AC
  Administered 2015-04-27: 30 mg via INTRAVENOUS
  Filled 2015-04-27: qty 1

## 2015-04-27 MED ORDER — SODIUM CHLORIDE 0.9 % IV BOLUS (SEPSIS)
1000.0000 mL | Freq: Once | INTRAVENOUS | Status: AC
  Administered 2015-04-27: 1000 mL via INTRAVENOUS

## 2015-04-27 MED ORDER — IPRATROPIUM-ALBUTEROL 0.5-2.5 (3) MG/3ML IN SOLN
RESPIRATORY_TRACT | Status: AC
  Filled 2015-04-27: qty 3

## 2015-04-27 MED ORDER — DEXTROSE 5 % IV SOLN
2.0000 g | Freq: Once | INTRAVENOUS | Status: AC
  Administered 2015-04-27: 2 g via INTRAVENOUS
  Filled 2015-04-27 (×2): qty 2

## 2015-04-27 NOTE — Discharge Instructions (Signed)
How to Use an Inhaler Using your inhaler correctly is very important. Good technique will make sure that the medicine reaches your lungs.  HOW TO USE AN INHALER:  Take the cap off the inhaler.  If this is the first time using your inhaler, you need to prime it. Shake the inhaler for 5 seconds. Release four puffs into the air, away from your face. Ask your doctor for help if you have questions.  Shake the inhaler for 5 seconds.  Turn the inhaler so the bottle is above the mouthpiece.  Put your pointer finger on top of the bottle. Your thumb holds the bottom of the inhaler.  Open your mouth.  Either hold the inhaler away from your mouth (the width of 2 fingers) or place your lips tightly around the mouthpiece. Ask your doctor which way to use your inhaler.  Breathe out as much air as possible.  Breathe in and push down on the bottle 1 time to release the medicine. You will feel the medicine go in your mouth and throat.  Continue to take a deep breath in very slowly. Try to fill your lungs.  After you have breathed in completely, hold your breath for 10 seconds. This will help the medicine to settle in your lungs. If you cannot hold your breath for 10 seconds, hold it for as long as you can before you breathe out.  Breathe out slowly, through pursed lips. Whistling is an example of pursed lips.  If your doctor has told you to take more than 1 puff, wait at least 15-30 seconds between puffs. This will help you get the best results from your medicine. Do not use the inhaler more than your doctor tells you to.  Put the cap back on the inhaler.  Follow the directions from your doctor or from the inhaler package about cleaning the inhaler. If you use more than one inhaler, ask your doctor which inhalers to use and what order to use them in. Ask your doctor to help you figure out when you will need to refill your inhaler.  If you use a steroid inhaler, always rinse your mouth with water  after your last puff, gargle and spit out the water. Do not swallow the water. GET HELP IF:  The inhaler medicine only partially helps to stop wheezing or shortness of breath.  You are having trouble using your inhaler.  You have some increase in thick spit (phlegm). GET HELP RIGHT AWAY IF:  The inhaler medicine does not help your wheezing or shortness of breath or you have tightness in your chest.  You have dizziness, headaches, or fast heart rate.  You have chills, fever, or night sweats.  You have a large increase of thick spit, or your thick spit is bloody. MAKE SURE YOU:   Understand these instructions.  Will watch your condition.  Will get help right away if you are not doing well or get worse. Document Released: 05/14/2008 Document Revised: 05/26/2013 Document Reviewed: 03/04/2013 [facility] Patient Information 2015 [place], [place]. This information is not intended to replace advice given to you by your health care provider. Make sure you discuss any questions you have with your health care provider.  Upper Respiratory Infection, Adult An upper respiratory infection (URI) is also known as the common cold. It is often caused by a type of germ (virus). Colds are easily spread (contagious). You can pass it to others by kissing, coughing, sneezing, or drinking out of the same glass. Usually,  you get better in 1 or 2 weeks.  HOME CARE   Only take medicine as told by your doctor.  Use a warm mist humidifier or breathe in steam from a hot shower.  Drink enough water and fluids to keep your pee (urine) clear or pale yellow.  Get plenty of rest.  Return to work when your temperature is back to normal or as told by your doctor. You may use a face mask and wash your hands to stop your cold from spreading. GET HELP RIGHT AWAY IF:   After the first few days, you feel you are getting worse.  You have questions about your medicine.  You have chills, shortness of breath, or brown  or red spit (mucus).  You have yellow or brown snot (nasal discharge) or pain in the face, especially when you bend forward.  You have a fever, puffy (swollen) neck, pain when you swallow, or white spots in the back of your throat.  You have a bad headache, ear pain, sinus pain, or chest pain.  You have a high-pitched whistling sound when you breathe in and out (wheezing).  You have a lasting cough or cough up blood.  You have sore muscles or a stiff neck. MAKE SURE YOU:   Understand these instructions.  Will watch your condition.  Will get help right away if you are not doing well or get worse. Document Released: 01/22/2008 Document Revised: 10/28/2011 Document Reviewed: 11/10/2013 [facility] Patient Information 2015 [place], [place]. This information is not intended to replace advice given to you by your health care provider. Make sure you discuss any questions you have with your health care provider.   Take the prescription meds as directed.  Follow-up with [facility] as needed. Increase fluid intake to promote productive cough. Consider stopping smoking to prevent further lung irritation.

## 2015-04-27 NOTE — ED Provider Notes (Signed)
[facility] Emergency Department Provider Note ____________________________________________  Time seen: 1115  I have reviewed the triage vital signs and the nursing notes.  HISTORY  Chief Complaint  Cough and Hemoptysis  HPI Scottie Metayer Mert Dietrick. is a 44 y.o. male reports to the ED with sudden onset of productive cough, including bloody sputum, and shortness of breath over the last 2-3 days. He describes the symptoms were initially dose of that simple cold. But have quickly changed today he reports feeling winded, clammy, and reports pain in the abdomen and chest from excessive coughing. He denies any known fevers, or chest pain at this time.   Past Medical History  Diagnosis Date  . Hypertension     Resolved since weight loss  . Obesity (BMI 30.0-34.9)   . Psoriasis   . Diabetes mellitus without complication     Pre diabetic    Patient Active Problem List   Diagnosis Date Noted  . HTN (hypertension) 03/31/2015  . Smoking 03/31/2015  . Chest pain 03/20/2015  . Acute CHF 03/20/2015  . Elevated troponin 03/20/2015  . CHF (congestive heart failure) 03/20/2015    Past Surgical History  Procedure Laterality Date  . Amputation    . Finger amputation      Traumatic    Current Outpatient Rx  Name  Route  Sig  Dispense  Refill  . albuterol (PROVENTIL HFA;VENTOLIN HFA) 108 (90 BASE) MCG/ACT inhaler   Inhalation   Inhale 2 puffs into the lungs every 6 (six) hours as needed for wheezing or shortness of breath.   1 Inhaler   0   . aspirin EC 81 MG tablet   Oral   Take 1 tablet (81 mg total) by mouth daily.   30 tablet   0   . benzonatate (TESSALON PERLES) 100 MG capsule   Oral   Take 1 capsule (100 mg total) by mouth 3 (three) times daily as needed for cough (Take 1-2 per dose).   30 capsule   0   . carvedilol (COREG) 12.5 MG tablet   Oral   Take 1 tablet (12.5 mg total) by mouth 2 (two) times daily with a meal.   60 tablet   0   . cefdinir  (OMNICEF) 300 MG capsule   Oral   Take 1 capsule (300 mg total) by mouth 2 (two) times daily.   20 capsule   0   . furosemide (LASIX) 40 MG tablet   Oral   Take 1 tablet (40 mg total) by mouth 2 (two) times daily.   60 tablet   0   . lisinopril (PRINIVIL,ZESTRIL) 10 MG tablet   Oral   Take 1 tablet (10 mg total) by mouth daily.   30 tablet   0   . simvastatin (ZOCOR) 40 MG tablet   Oral   Take 1 tablet (40 mg total) by mouth at bedtime.   30 tablet   0    Allergies Prednisone  Family History  Problem Relation Age of Onset  . Diabetes Mellitus II Mother   . Hypertension Father   . Cancer Paternal Aunt   . Cancer Maternal Grandfather     Social History Social History  Substance Use Topics  . Smoking status: Current Every Day Smoker -- 0.25 packs/day for 33 years    Types: Cigarettes  . Smokeless tobacco: Never Used  . Alcohol Use: 1.8 oz/week    0 Standard drinks or equivalent, 3 Cans of beer per week  Review of Systems  Constitutional: Negative for fever. Eyes: Negative for visual changes. ENT: Negative for sore throat. Cardiovascular: Negative for chest pain. Respiratory: Positive for shortness of breath & cough. Gastrointestinal: Negative for abdominal pain, vomiting and diarrhea. Genitourinary: Negative for dysuria. Musculoskeletal: Negative for back pain. Skin: Negative for rash. Neurological: Negative for headaches, focal weakness or numbness. ____________________________________________  PHYSICAL EXAM:  VITAL SIGNS: ED Triage Vitals  Enc Vitals Group     BP 04/27/15 0855 152/100 mmHg     Pulse Rate 04/27/15 0855 98     Resp 04/27/15 0855 20     Temp 04/27/15 0855 98.8 F (37.1 C)     Temp Source 04/27/15 0855 Oral     SpO2 04/27/15 0855 94 %     Weight 04/27/15 0855 240 lb (108.863 kg)     Height 04/27/15 0855 6' (1.829 m)     Head Cir --      Peak Flow --      Pain Score 04/27/15 0853 6     Pain Loc --      Pain Edu? --      Excl.  in [place]? --    Constitutional: Alert and oriented. Well appearing and in no distress. Eyes: Conjunctivae are normal. PERRL. Normal extraocular movements. ENT   Head: Normocephalic and atraumatic.   Nose: No congestion/rhinorrhea.   Mouth/Throat: Mucous membranes are moist.   Neck: Supple. No thyromegaly. Hematological/Lymphatic/Immunological: No cervical lymphadenopathy. Cardiovascular: Normal rate, regular rhythm.  Respiratory: Normal respiratory effort. No wheezes/rales. Bilateral rhonchi. Gastrointestinal: Soft and nontender. No distention. Musculoskeletal: Nontender with normal range of motion in all extremities.  Neurologic:  Normal gait without ataxia. Normal speech and language. No gross focal neurologic deficits are appreciated. Skin:  Skin is warm, dry and intact. No rash noted. Psychiatric: Mood and affect are normal. Patient exhibits appropriate insight and judgment. ____________________________________________   LABS (pertinent positives/negatives) Labs Reviewed  CBC - Abnormal; Notable for the following:    WBC 13.9 (*)    RDW 15.5 (*)    Platelets 148 (*)    All other components within normal limits  COMPREHENSIVE METABOLIC PANEL - Abnormal; Notable for the following:    Glucose, Bld 152 (*)    Calcium 8.8 (*)    Total Bilirubin 4.7 (*)    All other components within normal limits  ____________________________________________   RADIOLOGY CXR IMPRESSION: Stable mild central pulmonary vascular congestion. Stable left midlung scarring or subsegmental atelectasis. I, Joe Gee, Dannielle Karvonen, personally viewed and evaluated these images (plain radiographs) as part of my medical decision making.  ____________________________________________  PROCEDURES  DuoNeb x 2 Toradol 30 mg IVP 1000 ml NS Rocephin 2 g IVP ____________________________________________  INITIAL IMPRESSION / ASSESSMENT AND PLAN / ED COURSE  Patient reports improved symptoms  following fluid hydration, and DuoNeb treatments. He'll be sent [facility] with [facility] for treatment of a bronchitis. He is to follow-up with his primary care provider as needed. Prescription for albuterol, Tessalon Perles also provided. Return as needed for worsening symptoms. ____________________________________________  FINAL CLINICAL IMPRESSION(S) / ED DIAGNOSES  Final diagnoses:  Bronchitis     Melvenia Needles, PA-C 04/27/15 [place], MD 04/28/15 [id number]

## 2015-04-27 NOTE — ED Notes (Signed)
Patient to ED with report of cough productive of bloody sputum, now with shortness of breath. Reports initially having cold symptoms.

## 2015-05-02 ENCOUNTER — Ambulatory Visit: Payer: Self-pay | Admitting: Family

## 2015-05-03 ENCOUNTER — Telehealth: Payer: Self-pay | Admitting: Family

## 2015-05-03 NOTE — Telephone Encounter (Signed)
Patient did not show for his appointment at the [facility] on 05/02/15. Will attempt to reschedule.

## 2015-05-17 ENCOUNTER — Other Ambulatory Visit: Payer: Self-pay

## 2015-05-17 ENCOUNTER — Inpatient Hospital Stay
Admission: EM | Admit: 2015-05-17 | Discharge: 2015-05-19 | DRG: 292 | Disposition: A | Discharge status: Home / Self Care | Payer: Self-pay | Attending: Specialist | Admitting: Specialist

## 2015-05-17 ENCOUNTER — Emergency Department: Payer: Self-pay

## 2015-05-17 ENCOUNTER — Encounter: Payer: Self-pay | Admitting: Emergency Medicine

## 2015-05-17 DIAGNOSIS — J441 Chronic obstructive pulmonary disease with (acute) exacerbation: Secondary | ICD-10-CM | POA: Diagnosis present

## 2015-05-17 DIAGNOSIS — J449 Chronic obstructive pulmonary disease, unspecified: Secondary | ICD-10-CM | POA: Diagnosis present

## 2015-05-17 DIAGNOSIS — E782 Mixed hyperlipidemia: Secondary | ICD-10-CM | POA: Diagnosis present

## 2015-05-17 DIAGNOSIS — I200 Unstable angina: Secondary | ICD-10-CM | POA: Diagnosis present

## 2015-05-17 DIAGNOSIS — Z7982 Long term (current) use of aspirin: Secondary | ICD-10-CM

## 2015-05-17 DIAGNOSIS — Z888 Allergy status to other drugs, medicaments and biological substances status: Secondary | ICD-10-CM

## 2015-05-17 DIAGNOSIS — I5043 Acute on chronic combined systolic (congestive) and diastolic (congestive) heart failure: Principal | ICD-10-CM | POA: Diagnosis present

## 2015-05-17 DIAGNOSIS — E119 Type 2 diabetes mellitus without complications: Secondary | ICD-10-CM | POA: Diagnosis present

## 2015-05-17 DIAGNOSIS — E785 Hyperlipidemia, unspecified: Secondary | ICD-10-CM | POA: Diagnosis present

## 2015-05-17 DIAGNOSIS — R748 Abnormal levels of other serum enzymes: Secondary | ICD-10-CM | POA: Diagnosis present

## 2015-05-17 DIAGNOSIS — Z6830 Body mass index (BMI) 30.0-30.9, adult: Secondary | ICD-10-CM

## 2015-05-17 DIAGNOSIS — I509 Heart failure, unspecified: Secondary | ICD-10-CM

## 2015-05-17 DIAGNOSIS — E669 Obesity, unspecified: Secondary | ICD-10-CM | POA: Diagnosis present

## 2015-05-17 DIAGNOSIS — I119 Hypertensive heart disease without heart failure: Secondary | ICD-10-CM | POA: Diagnosis present

## 2015-05-17 DIAGNOSIS — F17210 Nicotine dependence, cigarettes, uncomplicated: Secondary | ICD-10-CM | POA: Diagnosis present

## 2015-05-17 DIAGNOSIS — I10 Essential (primary) hypertension: Secondary | ICD-10-CM | POA: Diagnosis present

## 2015-05-17 DIAGNOSIS — R079 Chest pain, unspecified: Secondary | ICD-10-CM

## 2015-05-17 DIAGNOSIS — L409 Psoriasis, unspecified: Secondary | ICD-10-CM | POA: Diagnosis present

## 2015-05-17 DIAGNOSIS — Z9114 Patient's other noncompliance with medication regimen: Secondary | ICD-10-CM | POA: Diagnosis present

## 2015-05-17 DIAGNOSIS — I160 Hypertensive urgency: Secondary | ICD-10-CM

## 2015-05-17 HISTORY — DX: Heart failure, unspecified: I50.9

## 2015-05-17 HISTORY — DX: Chronic obstructive pulmonary disease, unspecified: J44.9

## 2015-05-17 LAB — CBC
HCT: 54.2 % — ABNORMAL HIGH (ref 40.0–52.0)
Hemoglobin: 17.4 g/dL (ref 13.0–18.0)
MCH: 27 pg (ref 26.0–34.0)
MCHC: 32.1 g/dL (ref 32.0–36.0)
MCV: 84.3 fL (ref 80.0–100.0)
Platelets: 171 10*3/uL (ref 150–440)
RBC: 6.42 MIL/uL — ABNORMAL HIGH (ref 4.40–5.90)
RDW: 16.4 % — ABNORMAL HIGH (ref 11.5–14.5)
WBC: 13 10*3/uL — ABNORMAL HIGH (ref 3.8–10.6)

## 2015-05-17 LAB — BASIC METABOLIC PANEL
Anion gap: 10 (ref 5–15)
BUN: 18 mg/dL (ref 6–20)
CO2: 22 mmol/L (ref 22–32)
Calcium: 9.5 mg/dL (ref 8.9–10.3)
Chloride: 110 mmol/L (ref 101–111)
Creatinine, Ser: 0.88 mg/dL (ref 0.61–1.24)
GFR calc Af Amer: >60 mL/min (ref >60)
GFR calc non Af Amer: >60 mL/min (ref >60)
Glucose, Bld: 114 mg/dL — ABNORMAL HIGH (ref 65–99)
Potassium: 3.7 mmol/L (ref 3.5–5.1)
Sodium: 142 mmol/L (ref 135–145)

## 2015-05-17 LAB — BRAIN NATRIURETIC PEPTIDE: B Natriuretic Peptide: 1103 pg/mL — ABNORMAL HIGH (ref 0.0–100.0)

## 2015-05-17 LAB — TROPONIN I
Troponin I: 0.06 ng/mL — ABNORMAL HIGH (ref <0.031)
Troponin I: 0.05 ng/mL — ABNORMAL HIGH (ref <0.031)

## 2015-05-17 MED ORDER — CARVEDILOL 12.5 MG PO TABS
12.5000 mg | ORAL_TABLET | Freq: Two times a day (BID) | ORAL | Status: DC
  Administered 2015-05-18 – 2015-05-19 (×3): 12.5 mg via ORAL
  Filled 2015-05-17 (×3): qty 1

## 2015-05-17 MED ORDER — MORPHINE SULFATE (PF) 4 MG/ML IV SOLN
INTRAVENOUS | Status: AC
  Filled 2015-05-17: qty 1

## 2015-05-17 MED ORDER — LISINOPRIL 10 MG PO TABS
10.0000 mg | ORAL_TABLET | Freq: Every day | ORAL | Status: DC
Start: 2015-05-18 — End: 2015-05-19
  Administered 2015-05-18 – 2015-05-19 (×2): 10 mg via ORAL
  Filled 2015-05-17 (×2): qty 1

## 2015-05-17 MED ORDER — ASPIRIN EC 81 MG PO TBEC
81.0000 mg | DELAYED_RELEASE_TABLET | Freq: Every day | ORAL | Status: DC
  Administered 2015-05-18 – 2015-05-19 (×2): 81 mg via ORAL
  Filled 2015-05-17 (×2): qty 1

## 2015-05-17 MED ORDER — LISINOPRIL 20 MG PO TABS
10.0000 mg | ORAL_TABLET | ORAL | Status: AC
  Administered 2015-05-17: 10 mg via ORAL

## 2015-05-17 MED ORDER — MORPHINE SULFATE (PF) 2 MG/ML IV SOLN
INTRAVENOUS | Status: AC
  Filled 2015-05-17: qty 1

## 2015-05-17 MED ORDER — ASPIRIN 81 MG PO CHEW
CHEWABLE_TABLET | ORAL | Status: AC
  Filled 2015-05-17: qty 4

## 2015-05-17 MED ORDER — ACETAMINOPHEN 325 MG PO TABS
650.0000 mg | ORAL_TABLET | Freq: Four times a day (QID) | ORAL | Status: DC | PRN
  Administered 2015-05-18: 650 mg via ORAL
  Filled 2015-05-17: qty 2

## 2015-05-17 MED ORDER — FENTANYL CITRATE (PF) 100 MCG/2ML IJ SOLN
50.0000 ug | Freq: Once | INTRAMUSCULAR | Status: AC
Start: 2015-05-17 — End: 2015-05-17
  Administered 2015-05-17: 50 ug via INTRAVENOUS

## 2015-05-17 MED ORDER — ALBUTEROL SULFATE (2.5 MG/3ML) 0.083% IN NEBU: 3.0000 mL | INHALATION_SOLUTION | Freq: Four times a day (QID) | RESPIRATORY_TRACT | Status: DC | PRN

## 2015-05-17 MED ORDER — NITROGLYCERIN 2 % TD OINT
0.5000 [in_us] | TOPICAL_OINTMENT | Freq: Once | TRANSDERMAL | Status: AC
  Administered 2015-05-17: 0.5 [in_us] via TOPICAL

## 2015-05-17 MED ORDER — SIMVASTATIN 40 MG PO TABS
40.0000 mg | ORAL_TABLET | Freq: Every day | ORAL | Status: DC
  Administered 2015-05-18 (×2): 40 mg via ORAL
  Filled 2015-05-17 (×2): qty 1

## 2015-05-17 MED ORDER — FENTANYL CITRATE (PF) 100 MCG/2ML IJ SOLN
INTRAMUSCULAR | Status: AC
  Filled 2015-05-17: qty 2

## 2015-05-17 MED ORDER — LABETALOL HCL 5 MG/ML IV SOLN
10.0000 mg | INTRAVENOUS | Status: DC | PRN
  Administered 2015-05-17: 10 mg via INTRAVENOUS
  Filled 2015-05-17 (×2): qty 4

## 2015-05-17 MED ORDER — ONDANSETRON HCL 4 MG PO TABS: 4.0000 mg | ORAL_TABLET | Freq: Four times a day (QID) | ORAL | Status: DC | PRN

## 2015-05-17 MED ORDER — ENOXAPARIN SODIUM 40 MG/0.4ML ~~LOC~~ SOLN
40.0000 mg | SUBCUTANEOUS | Status: DC
  Administered 2015-05-18 (×2): 40 mg via SUBCUTANEOUS
  Filled 2015-05-17 (×2): qty 0.4

## 2015-05-17 MED ORDER — ASPIRIN 81 MG PO CHEW
324.0000 mg | CHEWABLE_TABLET | Freq: Once | ORAL | Status: AC
  Administered 2015-05-17: 324 mg via ORAL

## 2015-05-17 MED ORDER — MORPHINE SULFATE (PF) 4 MG/ML IV SOLN
6.0000 mg | Freq: Once | INTRAVENOUS | Status: AC
Start: 2015-05-17 — End: 2015-05-17
  Administered 2015-05-17: 6 mg via INTRAVENOUS

## 2015-05-17 MED ORDER — FUROSEMIDE 40 MG PO TABS
40.0000 mg | ORAL_TABLET | Freq: Two times a day (BID) | ORAL | Status: DC
  Administered 2015-05-18 – 2015-05-19 (×3): 40 mg via ORAL
  Filled 2015-05-17 (×3): qty 1

## 2015-05-17 MED ORDER — SODIUM CHLORIDE 0.9 % IJ SOLN
3.0000 mL | Freq: Two times a day (BID) | INTRAMUSCULAR | Status: DC
  Administered 2015-05-18 – 2015-05-19 (×4): 3 mL via INTRAVENOUS

## 2015-05-17 MED ORDER — FUROSEMIDE 10 MG/ML IJ SOLN
20.0000 mg | Freq: Once | INTRAMUSCULAR | Status: AC
  Administered 2015-05-17: 20 mg via INTRAVENOUS
  Filled 2015-05-17: qty 4

## 2015-05-17 MED ORDER — MORPHINE SULFATE (PF) 2 MG/ML IV SOLN
2.0000 mg | INTRAVENOUS | Status: DC | PRN
  Administered 2015-05-18 (×3): 2 mg via INTRAVENOUS
  Filled 2015-05-17 (×3): qty 1

## 2015-05-17 MED ORDER — ONDANSETRON HCL 4 MG/2ML IJ SOLN: 4.0000 mg | Freq: Four times a day (QID) | INTRAMUSCULAR | Status: DC | PRN

## 2015-05-17 MED ORDER — NITROGLYCERIN 2 % TD OINT
TOPICAL_OINTMENT | TRANSDERMAL | Status: AC
  Filled 2015-05-17: qty 1

## 2015-05-17 MED ORDER — ACETAMINOPHEN 650 MG RE SUPP: 650.0000 mg | Freq: Four times a day (QID) | RECTAL | Status: DC | PRN

## 2015-05-17 MED ORDER — LISINOPRIL 20 MG PO TABS
ORAL_TABLET | ORAL | Status: AC
  Filled 2015-05-17: qty 1

## 2015-05-17 NOTE — ED Provider Notes (Signed)
-----------------------------------------   9:45 PM on 05/17/2015 -----------------------------------------  Patient noted to the hospitalist service prior to the previous doctors shift closure. Os pulses in the patient right now. However, I was called to bedside because the patient was having chest pain again. I've given him nitroglycerin and fentanyl he feels much better. We are repeating EKG. The hospitalist is taking over his care at this time.  Schuyler Amor, MD 05/17/15 2146

## 2015-05-17 NOTE — H&P (Signed)
[facility] [place] NAME: Gabriel [name]    MR#:  [id number]  DATE OF BIRTH:  12/23/1970  DATE OF ADMISSION:  05/17/2015  PRIMARY CARE PHYSICIAN: No PCP Per Patient   REQUESTING/REFERRING PHYSICIAN: Jacqualine Code, MD  CHIEF COMPLAINT:   Chief Complaint  Patient presents with  . Chest Pain    HISTORY OF PRESENT ILLNESS:  Gabriel [name]  is a 44 y.o. male who presents with chest pain, and some shortness of breath. Patient has known chronic systolic and diastolic heart failure. He states that today he was not overly exerting himself, but was going about his daily routine when he started having chest pain around 5:00 in the afternoon. The pain did not ease up even after he sat down to rest and took an aspirin, so he came to the ED for evaluation. He was found here to have an elevated BNP, chest x-ray that looks like vascular congestion, and a mildly elevated troponin of 0.06, level which seems to be stable from his prior recorded values. His blood pressure was also significantly elevated on presentation the ED. Patient states he has not been able to afford his medications recently, but also states that even prior to this when he was on his meds his blood pressure would always run high. He was given some IV Lasix in the ED, and nitrates improved this chest pain. Hospitalists were called for admission for acute CHF and potential ACS.  PAST MEDICAL HISTORY:   Past Medical History  Diagnosis Date  . Hypertension     Resolved since weight loss  . Obesity (BMI 30.0-34.9)   . Psoriasis   . Diabetes mellitus without complication     Pre diabetic  . CHF (congestive heart failure)   . COPD (chronic obstructive pulmonary disease)     PAST SURGICAL HISTORY:   Past Surgical History  Procedure Laterality Date  . Amputation    . Finger amputation      Traumatic    SOCIAL HISTORY:   Social History  Substance Use Topics  . Smoking status: Current  Every Day Smoker -- 0.25 packs/day for 33 years    Types: Cigarettes  . Smokeless tobacco: Never Used  . Alcohol Use: 1.8 oz/week    3 Cans of beer, 0 Standard drinks or equivalent per week     Comment: occassionally    FAMILY HISTORY:   Family History  Problem Relation Age of Onset  . Diabetes Mellitus II Mother   . Hypertension Father   . Cancer Paternal Aunt   . Cancer Maternal Grandfather     DRUG ALLERGIES:   Allergies  Allergen Reactions  . Prednisone Other (See Comments)    Reaction: Hallucinations     MEDICATIONS AT HOME:   Prior to Admission medications   Medication Sig Start Date End Date Taking? Authorizing Provider  albuterol (PROVENTIL HFA;VENTOLIN HFA) 108 (90 BASE) MCG/ACT inhaler Inhale 2 puffs into the lungs every 6 (six) hours as needed for wheezing or shortness of breath. 04/27/15   Jenise V Bacon Menshew, PA-C  aspirin EC 81 MG tablet Take 1 tablet (81 mg total) by mouth daily. 03/21/15   Demetrios Loll, MD  benzonatate (TESSALON PERLES) 100 MG capsule Take 1 capsule (100 mg total) by mouth 3 (three) times daily as needed for cough (Take 1-2 per dose). Patient taking differently: Take 100 mg by mouth 3 (three) times daily as needed for cough.  04/27/15   Alita Chyle  Bacon Menshew, PA-C  carvedilol (COREG) 12.5 MG tablet Take 1 tablet (12.5 mg total) by mouth 2 (two) times daily with a meal. 03/21/15   Demetrios Loll, MD  furosemide (LASIX) 40 MG tablet Take 1 tablet (40 mg total) by mouth 2 (two) times daily. 03/21/15   Demetrios Loll, MD  lisinopril (PRINIVIL,ZESTRIL) 10 MG tablet Take 1 tablet (10 mg total) by mouth daily. 03/21/15   Demetrios Loll, MD  simvastatin (ZOCOR) 40 MG tablet Take 1 tablet (40 mg total) by mouth at bedtime. 03/21/15   Demetrios Loll, MD    REVIEW OF SYSTEMS:  Review of Systems  Constitutional: Negative for fever, chills, weight loss and malaise/fatigue.  HENT: Negative for ear pain, hearing loss and tinnitus.   Eyes: Negative for blurred vision, double vision, pain  and redness.  Respiratory: Positive for shortness of breath. Negative for cough and hemoptysis.   Cardiovascular: Positive for chest pain. Negative for palpitations, orthopnea and leg swelling.  Gastrointestinal: Negative for nausea, vomiting, abdominal pain, diarrhea and constipation.  Genitourinary: Negative for dysuria, frequency and hematuria.  Musculoskeletal: Negative for back pain, joint pain and neck pain.  Skin:       No acne, rash, or lesions  Neurological: Negative for dizziness, tremors, focal weakness and weakness.  Endo/Heme/Allergies: Negative for polydipsia. Does not bruise/bleed easily.  Psychiatric/Behavioral: Negative for depression. The patient is not nervous/anxious and does not have insomnia.      VITAL SIGNS:   Filed Vitals:   05/17/15 1952 05/17/15 2000 05/17/15 2030 05/17/15 2130  BP: 183/135 195/171 168/129 153/126  Pulse: 96 102 105 89  Temp:      TempSrc:      Resp:  22 32 20  Height:      Weight:      SpO2: 99% 98% 98% 97%   Wt Readings from Last 3 Encounters:  05/17/15 111.131 kg (245 lb)  04/27/15 108.863 kg (240 lb)  04/12/15 110.678 kg (244 lb)    PHYSICAL EXAMINATION:  Physical Exam  Vitals reviewed. Constitutional: He is oriented to person, place, and time. He appears well-developed and well-nourished. No distress.  HENT:  Head: Normocephalic and atraumatic.  Mouth/Throat: Oropharynx is clear and moist.  Eyes: Conjunctivae and EOM are normal. Pupils are equal, round, and reactive to light. No scleral icterus.  Neck: Normal range of motion. Neck supple. No JVD present. No thyromegaly present.  Cardiovascular: Normal rate, regular rhythm and intact distal pulses.  Exam reveals no gallop and no friction rub.   No murmur heard. Respiratory: Effort normal. No respiratory distress. He has no wheezes. He has no rales.  GI: Soft. Bowel sounds are normal. He exhibits no distension. There is no tenderness.  Musculoskeletal: Normal range of motion.  He exhibits no edema.  No arthritis, no gout  Lymphadenopathy:    He has no cervical adenopathy.  Neurological: He is alert and oriented to person, place, and time. No cranial nerve deficit.  No dysarthria, no aphasia  Skin: Skin is warm and dry. No rash noted. No erythema.  Psychiatric: He has a normal mood and affect. His behavior is normal. Judgment and thought content normal.    LABORATORY PANEL:   CBC  Recent Labs Lab 05/17/15 1853  WBC 13.0*  HGB 17.4  HCT 54.2*  PLT 171   ------------------------------------------------------------------------------------------------------------------  Chemistries   Recent Labs Lab 05/17/15 1853  NA 142  K 3.7  CL 110  CO2 22  GLUCOSE 114*  BUN 18  CREATININE 0.88  CALCIUM 9.5   ------------------------------------------------------------------------------------------------------------------  Cardiac Enzymes  Recent Labs Lab 05/17/15 1853  TROPONINI 0.06*   ------------------------------------------------------------------------------------------------------------------  RADIOLOGY:  Dg Chest 2 View  05/17/2015   CLINICAL DATA:  Centralized chest pain since 5 p.m.  EXAM: CHEST  2 VIEW  COMPARISON:  04/27/2015  FINDINGS: The heart is enlarged but stable. The mediastinal and hilar contours are stable. Mild tortuosity of the thoracic aorta. Central vascular congestion and chronic interstitial changes with left midlung scarring. No definite infiltrates, edema or effusions. The bony thorax is intact.  IMPRESSION: Cardiac enlargement and central vascular congestion without overt pulmonary edema.  Chronic bronchitic changes and interstitial scarring but no acute infiltrate.   Electronically Signed   By: Marijo Sanes M.D.   On: 05/17/2015 19:18    EKG:   Orders placed or performed during the hospital encounter of 05/17/15  . ED EKG within 10 minutes  . ED EKG within 10 minutes  . EKG 12-Lead  . EKG 12-Lead  . EKG 12-Lead   . EKG 12-Lead    IMPRESSION AND PLAN:  Principal Problem:   Acute on chronic combined systolic and diastolic CHF (congestive heart failure) - IV Lasix given in the ED, we'll continue home dose Lasix from here. Troponin mildly elevated, though stable from prior values. We'll continue to monitor, and consult cardiology for their opinion. Active Problems:   Unstable angina - likely due to strain on his heart from his elevated blood pressure. EKG did not show significant ischemic pattern. We'll trend his cardiac enzymes, get a cardiology consult to assist in evaluation in the morning.   Accelerated hypertension - Blood pressure came down some administration of nitrates, however we will administer IV when necessary antihypertensives to maintain a goal blood pressure less than 160/100. Continue his home medications as well.    Type II diabetes mellitus - Last hemoglobin A1c recently was 5.4. We will hold off on sliding scale insulin parameters at this time, give him a carb modified diet when he is eating again as he is nothing by mouth for now in case cardiology wants to do any kind of procedure in the morning, and monitor his glucose values. If they begin to rise then sliding scale insulin parameters can be ordered.    Hyperlipidemia - Continue home dose statin    COPD (chronic obstructive pulmonary disease) - Continue home when necessary rescue inhaler   All the records are reviewed and case discussed with ED provider. Management plans discussed with the patient and/or family.  DVT PROPHYLAXIS: SubQ lovenox  ADMISSION STATUS: Inpatient  CODE STATUS: Full  TOTAL TIME TAKING CARE OF THIS PATIENT: 40 minutes.    Derren Suydam Glenaire 05/17/2015, 10:01 PM  [facility]  Office  [phone]  CC: Primary care physician; No PCP Per Patient

## 2015-05-17 NOTE — ED Notes (Signed)
Iv meds given.  Family with pt.  

## 2015-05-17 NOTE — ED Notes (Signed)
Pt states pain increased again in chest.  md aware.

## 2015-05-17 NOTE — ED Notes (Signed)
Pt has left side chest pain since 1700 today.  Pt reports intermittent sob.  cig smoker.  No cough.  Pt hurts in chest with movement.  Pt states pain radiates into left arm.  Iv in place.  md at bedside.  Blood pressure elevated.  md aware .  Lab called with troponin of 0.06  Dr Jacqualine Code aware.  Family at bedside.

## 2015-05-17 NOTE — ED Notes (Signed)
Dr Jacqualine Code in with pt and family now.

## 2015-05-17 NOTE — ED Notes (Signed)
Pt reports centralized chest pain that radiates to left arm and shoulder that started about 5pm; pt reports shortness of breath. Pt with hx of CHF.

## 2015-05-17 NOTE — ED Provider Notes (Signed)
[facility] Emergency Department Provider Note REMINDER - THIS NOTE IS NOT A FINAL MEDICAL RECORD UNTIL IT IS SIGNED. UNTIL THEN, THE CONTENT BELOW MAY REFLECT INFORMATION FROM A DOCUMENTATION TEMPLATE, NOT THE ACTUAL PATIENT VISIT. ____________________________________________  Time seen: Approximately 7:51 PM  I have reviewed the triage vital signs and the nursing notes.   HISTORY  Chief Complaint Chest Pain    HPI Gabriel [name]. is a 44 y.o. male history of diastolic heart failure, diabetes.  Patient presents today with sudden onset of left-sided chest pressure while at rest. He reports a severe and heavy left-sided pain that radiates in the left arm. He denies nausea or vomiting, no shortness of breath. He does report the pain is slightly better he's double little bit to an 8 out of 10 now. He states that he is not compliant with any of his medications. He is not taking any of medicines.  The patient denies abdominal pain. He's previously been seen by Dr. Chancy Milroy of cardiology.  States this feels similar to when he last given in August.     Past Medical History  Diagnosis Date  . Hypertension     Resolved since weight loss  . Obesity (BMI 30.0-34.9)   . Psoriasis   . Diabetes mellitus without complication     Pre diabetic  . CHF (congestive heart failure)     Patient Active Problem List   Diagnosis Date Noted  . HTN (hypertension) 03/31/2015  . Smoking 03/31/2015  . Chest pain 03/20/2015  . Acute CHF 03/20/2015  . Elevated troponin 03/20/2015  . CHF (congestive heart failure) 03/20/2015    Past Surgical History  Procedure Laterality Date  . Amputation    . Finger amputation      Traumatic    Current Outpatient Rx  Name  Route  Sig  Dispense  Refill  . albuterol (PROVENTIL HFA;VENTOLIN HFA) 108 (90 BASE) MCG/ACT inhaler   Inhalation   Inhale 2 puffs into the lungs every 6 (six) hours as needed for wheezing or shortness of  breath.   1 Inhaler   0   . aspirin EC 81 MG tablet   Oral   Take 1 tablet (81 mg total) by mouth daily.   30 tablet   0   . benzonatate (TESSALON PERLES) 100 MG capsule   Oral   Take 1 capsule (100 mg total) by mouth 3 (three) times daily as needed for cough (Take 1-2 per dose).   30 capsule   0   . carvedilol (COREG) 12.5 MG tablet   Oral   Take 1 tablet (12.5 mg total) by mouth 2 (two) times daily with a meal.   60 tablet   0   . furosemide (LASIX) 40 MG tablet   Oral   Take 1 tablet (40 mg total) by mouth 2 (two) times daily.   60 tablet   0   . lisinopril (PRINIVIL,ZESTRIL) 10 MG tablet   Oral   Take 1 tablet (10 mg total) by mouth daily.   30 tablet   0   . simvastatin (ZOCOR) 40 MG tablet   Oral   Take 1 tablet (40 mg total) by mouth at bedtime.   30 tablet   0     Allergies Prednisone  Family History  Problem Relation Age of Onset  . Diabetes Mellitus II Mother   . Hypertension Father   . Cancer Paternal Aunt   . Cancer Maternal Grandfather  Social History Social History  Substance Use Topics  . Smoking status: Current Every Day Smoker -- 0.25 packs/day for 33 years    Types: Cigarettes  . Smokeless tobacco: Never Used  . Alcohol Use: 1.8 oz/week    3 Cans of beer, 0 Standard drinks or equivalent per week     Comment: occassionally    Review of Systems Constitutional: No fever/chills Eyes: No visual changes. ENT: No sore throat. Cardiovascular: See history of present illness Respiratory: Denies shortness of breath. Gastrointestinal: No abdominal pain.  No nausea, no vomiting.  No diarrhea.  No constipation. Genitourinary: Negative for dysuria. Musculoskeletal: Negative for back pain. Skin: Negative for rash. Neurological: Negative for headaches, focal weakness or numbness.  10-point ROS otherwise negative.  ____________________________________________   PHYSICAL EXAM:  VITAL SIGNS: ED Triage Vitals  Enc Vitals Group      BP 05/17/15 1843 176/125 mmHg     Pulse Rate 05/17/15 1843 89     Resp 05/17/15 1843 22     Temp 05/17/15 1843 97.9 F (36.6 C)     Temp Source 05/17/15 1843 Oral     SpO2 05/17/15 1843 97 %     Weight 05/17/15 1843 245 lb (111.131 kg)     Height 05/17/15 1843 6' (1.829 m)     Head Cir --      Peak Flow --      Pain Score 05/17/15 1841 10     Pain Loc --      Pain Edu? --      Excl. in [place]? --    Constitutional: Alert and oriented. Well appearing and in no acute distress. He is slightlyabrupt and seems irritated in demeanor Eyes: Conjunctivae are normal. PERRL. EOMI. Head: Atraumatic. Nose: No congestion/rhinnorhea. Patient has mild JVD. Mouth/Throat: Mucous membranes are moist.  Oropharynx non-erythematous. Neck: No stridor.   Cardiovascular: Normal rate, regular rhythm. Grossly normal heart sounds.  Good peripheral circulation. Respiratory: Normal respiratory effort.  No retractions. Lungs CTAB. Gastrointestinal: Soft and nontender. No distention. No abdominal bruits. No CVA tenderness. Musculoskeletal: No lower extremity tenderness nor edema.  No joint effusions. Neurologic:  Normal speech and language. No gross focal neurologic deficits are appreciated. Skin:  Skin is warm, dry and intact. No rash noted. Psychiatric: Mood and affect are normal. Speech and behavior are normal.  ____________________________________________   LABS (all labs ordered are listed, but only abnormal results are displayed)  Labs Reviewed  BASIC METABOLIC PANEL - Abnormal; Notable for the following:    Glucose, Bld 114 (*)    All other components within normal limits  CBC - Abnormal; Notable for the following:    WBC 13.0 (*)    RBC 6.42 (*)    HCT 54.2 (*)    RDW 16.4 (*)    All other components within normal limits  TROPONIN I - Abnormal; Notable for the following:    Troponin I 0.06 (*)    All other components within normal limits  BRAIN NATRIURETIC PEPTIDE - Abnormal; Notable for the  following:    B Natriuretic Peptide 1103.0 (*)    All other components within normal limits   ____________________________________________  EKG  With previous EKG no significant change EKG time 1845 Normal sinus rhythm ventricular rate 95, no acute ST elevation though there is significant negative voltage noted in anterior precordial leads. The patient has non-specific T wave abnormality noted in V6 ____________________________________________  RADIOLOGY   DG Chest 2 View (Final result) Result time: 05/17/15 19:18:24  Final result by Rad Results In Interface (05/17/15 19:18:24)   Narrative:   CLINICAL DATA: Centralized chest pain since 5 p.m.  EXAM: CHEST 2 VIEW  COMPARISON: 04/27/2015  FINDINGS: The heart is enlarged but stable. The mediastinal and hilar contours are stable. Mild tortuosity of the thoracic aorta. Central vascular congestion and chronic interstitial changes with left midlung scarring. No definite infiltrates, edema or effusions. The bony thorax is intact.  IMPRESSION: Cardiac enlargement and central vascular congestion without overt pulmonary edema.  Chronic bronchitic changes and interstitial scarring but no acute infiltrate.    ____________________________________________   PROCEDURES  Procedure(s) performed: None  Critical Care performed: No  ____________________________________________   INITIAL IMPRESSION / ASSESSMENT AND PLAN / ED COURSE  Pertinent labs & imaging results that were available during my care of the patient were reviewed by me and considered in my medical decision making (see chart for details).  Patient presents with concerning left-sided chest pain and pressure. His EKG does not demonstrate acute ischemia or ST elevation. Troponin is slightly elevated bili does have a history of similar elevations with myocardial strain and congestive heart failure. He is also very noncompliant with his medications as severe  as*hypertension. Likely the patient is suffering from his congestive heart failure exacerbation causing myocardial strain. I discussed with Dr. Chancy Milroy who advises aspirin, Lasix, lisinopril, and admission. The patient refused nitroglycerin.  ----------------------------------------- 8:38 PM on 05/17/2015 -----------------------------------------  Patient reports his pain is much better now, having occasional intermittent discomfort. His blood pressure slowly improving. We'll admit the patient for ongoing care for moderate risk chest pain, hypertensive urgency, and congestive heart failure exacerbation.  ____________________________________________   FINAL CLINICAL IMPRESSION(S) / ED DIAGNOSES  Final diagnoses:  Chest pain, moderate coronary artery risk  Hypertensive urgency  Acute exacerbation of CHF (congestive heart failure)      Delman Kitten, MD 05/17/15 2038

## 2015-05-18 DIAGNOSIS — I200 Unstable angina: Secondary | ICD-10-CM

## 2015-05-18 DIAGNOSIS — I5043 Acute on chronic combined systolic (congestive) and diastolic (congestive) heart failure: Principal | ICD-10-CM

## 2015-05-18 DIAGNOSIS — I10 Essential (primary) hypertension: Secondary | ICD-10-CM

## 2015-05-18 LAB — CBC
HCT: 48.7 % (ref 40.0–52.0)
Hemoglobin: 15.8 g/dL (ref 13.0–18.0)
MCH: 27 pg (ref 26.0–34.0)
MCHC: 32.3 g/dL (ref 32.0–36.0)
MCV: 83.5 fL (ref 80.0–100.0)
Platelets: 148 10*3/uL — ABNORMAL LOW (ref 150–440)
RBC: 5.83 MIL/uL (ref 4.40–5.90)
RDW: 16.3 % — ABNORMAL HIGH (ref 11.5–14.5)
WBC: 11.1 10*3/uL — ABNORMAL HIGH (ref 3.8–10.6)

## 2015-05-18 LAB — BASIC METABOLIC PANEL
Anion gap: 7 (ref 5–15)
BUN: 18 mg/dL (ref 6–20)
CO2: 24 mmol/L (ref 22–32)
Calcium: 8.9 mg/dL (ref 8.9–10.3)
Chloride: 111 mmol/L (ref 101–111)
Creatinine, Ser: 0.75 mg/dL (ref 0.61–1.24)
GFR calc Af Amer: >60 mL/min (ref >60)
GFR calc non Af Amer: >60 mL/min (ref >60)
Glucose, Bld: 111 mg/dL — ABNORMAL HIGH (ref 65–99)
Potassium: 3.6 mmol/L (ref 3.5–5.1)
Sodium: 142 mmol/L (ref 135–145)

## 2015-05-18 LAB — TROPONIN I
Troponin I: 0.04 ng/mL — ABNORMAL HIGH (ref <0.031)
Troponin I: 0.04 ng/mL — ABNORMAL HIGH (ref <0.031)

## 2015-05-18 MED ORDER — HYDRALAZINE HCL 20 MG/ML IJ SOLN
10.0000 mg | Freq: Once | INTRAMUSCULAR | Status: AC
  Administered 2015-05-18: 10 mg via INTRAVENOUS
  Filled 2015-05-18: qty 1

## 2015-05-18 MED ORDER — INFLUENZA VAC SPLIT QUAD 0.5 ML IM SUSY
0.5000 mL | PREFILLED_SYRINGE | INTRAMUSCULAR | Status: AC
  Administered 2015-05-19: 0.5 mL via INTRAMUSCULAR
  Filled 2015-05-18: qty 0.5

## 2015-05-18 NOTE — Progress Notes (Signed)
[place] [facility] [place] NAME: Gabriel [name]    MR#:  [id number]  DATE OF BIRTH:  26-Dec-1970  SUBJECTIVE:  CHIEF COMPLAINT:   Chief Complaint  Patient presents with  . Chest Pain   Patient here with chest pain and also in acute on chronic diastolic CHF. Clinicallyfeels much better. Shortness of breath improved. No chest pain  REVIEW OF SYSTEMS:    Review of Systems  Constitutional: Negative for fever and chills.  HENT: Negative for congestion and tinnitus.   Eyes: Negative for blurred vision and double vision.  Respiratory: Positive for shortness of breath (improved). Negative for cough and wheezing.   Cardiovascular: Negative for chest pain, orthopnea and PND.  Gastrointestinal: Negative for nausea, vomiting, abdominal pain and diarrhea.  Genitourinary: Negative for dysuria and hematuria.  Neurological: Negative for dizziness, sensory change and focal weakness.  All other systems reviewed and are negative.   Nutrition: Heart healthy Tolerating Diet: Yes Tolerating PT: Ambulatory   DRUG ALLERGIES:   Allergies  Allergen Reactions  . Prednisone Other (See Comments)    Reaction: Hallucinations     VITALS:  Blood pressure 147/119, pulse 86, temperature 97.8 F (36.6 C), temperature source Oral, resp. rate 20, height 6' (1.829 m), weight 101.47 kg (223 lb 11.2 oz), SpO2 99 %.  PHYSICAL EXAMINATION:   Physical Exam  GENERAL:  44 y.o.-year-old patient lying in the bed with no acute distress.  EYES: Pupils equal, round, reactive to light and accommodation. No scleral icterus. Extraocular muscles intact.  HEENT: Head atraumatic, normocephalic. Oropharynx and nasopharynx clear.  NECK:  Supple, no jugular venous distention. No thyroid enlargement, no tenderness.  LUNGS: Normal breath sounds bilaterally, no wheezing, rales, rhonchi. No use of accessory muscles of respiration.  CARDIOVASCULAR: S1, S2 normal. No murmurs, rubs, or  gallops.  ABDOMEN: Soft, nontender, nondistended. Bowel sounds present. No organomegaly or mass.  EXTREMITIES: No cyanosis, clubbing, trace pedal Edema bilaterally.    NEUROLOGIC: Cranial nerves II through XII are intact. No focal Motor or sensory deficits b/l.   PSYCHIATRIC: The patient is alert and oriented x 3. Good affect SKIN: No obvious rash, lesion, or ulcer.    LABORATORY PANEL:   CBC  Recent Labs Lab 05/18/15 0346  WBC 11.1*  HGB 15.8  HCT 48.7  PLT 148*   ------------------------------------------------------------------------------------------------------------------  Chemistries   Recent Labs Lab 05/18/15 0346  NA 142  K 3.6  CL 111  CO2 24  GLUCOSE 111*  BUN 18  CREATININE 0.75  CALCIUM 8.9   ------------------------------------------------------------------------------------------------------------------  Cardiac Enzymes  Recent Labs Lab 05/18/15 0949  TROPONINI 0.04*   ------------------------------------------------------------------------------------------------------------------  RADIOLOGY:  Dg Chest 2 View  05/17/2015   CLINICAL DATA:  Centralized chest pain since 5 p.m.  EXAM: CHEST  2 VIEW  COMPARISON:  04/27/2015  FINDINGS: The heart is enlarged but stable. The mediastinal and hilar contours are stable. Mild tortuosity of the thoracic aorta. Central vascular congestion and chronic interstitial changes with left midlung scarring. No definite infiltrates, edema or effusions. The bony thorax is intact.  IMPRESSION: Cardiac enlargement and central vascular congestion without overt pulmonary edema.  Chronic bronchitic changes and interstitial scarring but no acute infiltrate.   Electronically Signed   By: Marijo Sanes M.D.   On: 05/17/2015 19:18     ASSESSMENT AND PLAN:   44 year old male with past medical history of hypertension, morbid obesity, diabetes type 2 without consultation, CHF, COPD, who presented to the hospital with shortness  of  breath and chest pain and noted to be in congestive heart failure.  #1 CHF-acute on chronic systolic dysfunction. -This is likely secondary to medical noncompliance. As patient was not able to his medications. -Continue Lasix, beta blocker, lisinopril. Clinically improved. We'll assess for home oxygen prior to discharge.  #2 chest pain-atypical and mild troponin elevation. -Patient does have risk factors given tobacco abuse, diabetes and medical noncompliance. Seen by cardiology and plan to do a stress test in the morning. -Continue aspirin, beta blocker, statin.  #3 hypertension-continue Coreg, lisinopril. Hemodynamically stable.  #4 hyperlipidemia-continue simvastatin.   All the records are reviewed and case discussed with Care Management/Social Workerr. Management plans discussed with the patient, family and they are in agreement.  CODE STATUS: Full  DVT Prophylaxis: Lovenox  TOTAL TIME TAKING CARE OF THIS PATIENT: 25 minutes.   POSSIBLE D/C IN 1-2 DAYS, DEPENDING ON CLINICAL CONDITION.   Henreitta Leber M.D on 05/18/2015 at 4:07 PM  Between 7am to 6pm - Pager - [phone]  After 6pm go to [email or web address] - password [email or web address] [facility]  [facility]  Office  [phone]  CC: Primary care physician; No PCP Per Patient

## 2015-05-18 NOTE — Progress Notes (Signed)
Lexi was the 2nd nurse to verify skin on admission

## 2015-05-18 NOTE — Progress Notes (Signed)
Blood pressure has come down to 113/81. Patient states he feels really bad and he "knew this would happen when we drop his blood pressure like this". Denies wanting to take any pain medication for his chest hurting at this time. Vitals are stable at this time.

## 2015-05-18 NOTE — Care Management (Signed)
Patient known to this CM from previous admssion in August 2016.  At that time was discharged on lisinopril, lasix and coreg- all of which were on the [facility] four dollar list.  Provided patient with a coupon for the simvastatin and patient was able to purchase his meds.  He did follow up once with the heart faliure clinic but did not follow up with an appointment that was made to get established with a PCP at [facility].  Says he did not have transportation then but that problem is not present now.  Has recently started a new job and has to work for 90 days before receives insurance benefits

## 2015-05-18 NOTE — Plan of Care (Signed)
Problem: Consults Goal: Chest Pain Patient Education (See Patient Education module for education specifics.) Outcome: Completed/Met Date Met:  05/18/15 Patient is no longer having chest pain... Will continue to monitor

## 2015-05-18 NOTE — Consult Note (Signed)
CARDIOLOGY CONSULT NOTE  Patient ID: Gabriel [name]. MRN: [id number] DOB/AGE: Dec 29, 1970 44 y.o.  Admit date: 05/17/2015  Primary Physician : None Primary Cardiologist : Saw Dr. Humphrey Rolls before but requested a different cardiologist Reason for Consultation chest pain  HPI:  This is a 44 year old male who presented with chest pain. The patient was hospitalized in August with similar symptoms. He had an echocardiogram done which showed mildly reduced LV systolic function with mildly elevated troponin and BNP. The patient has been hypertensive but has not been able to afford his medications. He was discharged home on antihypertensives medications but has not been able to take them. Other medical problems include tobacco use and obesity. He reports sharp left-sided pain at rest. He denies substernal tightness with activities. He has chronic exertional dyspnea. His troponin was found to be borderline elevated but flat. BNP was also elevated. He was hypertensive on presentation.  Review of systems complete and found to be negative unless listed above   Past Medical History  Diagnosis Date  . Hypertension     Resolved since weight loss  . Obesity (BMI 30.0-34.9)   . Psoriasis   . Diabetes mellitus without complication     Pre diabetic  . CHF (congestive heart failure)   . COPD (chronic obstructive pulmonary disease)     Family History  Problem Relation Age of Onset  . Diabetes Mellitus II Mother   . Hypertension Father   . Cancer Paternal Aunt   . Cancer Maternal Grandfather     Social History   Social History  . Marital Status: Divorced    Spouse Name: N/A  . Number of Children: N/A  . Years of Education: N/A   Occupational History  . Not on file.   Social History Main Topics  . Smoking status: Current Every Day Smoker -- 0.25 packs/day for 33 years    Types: Cigarettes  . Smokeless tobacco: Never Used  . Alcohol Use: 1.8 oz/week    3 Cans of beer, 0 Standard drinks or  equivalent per week     Comment: occassionally  . Drug Use: No  . Sexual Activity: Not on file   Other Topics Concern  . Not on file   Social History Narrative    Past Surgical History  Procedure Laterality Date  . Amputation    . Finger amputation      Traumatic     Prescriptions prior to admission  Medication Sig Dispense Refill Last Dose  . albuterol (PROVENTIL HFA;VENTOLIN HFA) 108 (90 BASE) MCG/ACT inhaler Inhale 2 puffs into the lungs every 6 (six) hours as needed for wheezing or shortness of breath. 1 Inhaler 0   . aspirin EC 81 MG tablet Take 1 tablet (81 mg total) by mouth daily. 30 tablet 0 Taking  . benzonatate (TESSALON PERLES) 100 MG capsule Take 1 capsule (100 mg total) by mouth 3 (three) times daily as needed for cough (Take 1-2 per dose). (Patient taking differently: Take 100 mg by mouth 3 (three) times daily as needed for cough. ) 30 capsule 0   . carvedilol (COREG) 12.5 MG tablet Take 1 tablet (12.5 mg total) by mouth 2 (two) times daily with a meal. 60 tablet 0 Taking  . furosemide (LASIX) 40 MG tablet Take 1 tablet (40 mg total) by mouth 2 (two) times daily. 60 tablet 0 Taking  . lisinopril (PRINIVIL,ZESTRIL) 10 MG tablet Take 1 tablet (10 mg total) by mouth daily. 30 tablet 0 Taking  .  simvastatin (ZOCOR) 40 MG tablet Take 1 tablet (40 mg total) by mouth at bedtime. 30 tablet 0 Taking    Physical Exam: Blood pressure 140/108, pulse 69, temperature 96.4 F (35.8 C), temperature source Axillary, resp. rate 18, height 6' (1.829 m), weight 223 lb 11.2 oz (101.47 kg), SpO2 96 %.   Constitutional: He is oriented to person, place, and time. He appears well-developed and well-nourished. No distress.  HENT: No nasal discharge.  Head: Normocephalic and atraumatic.  Eyes: Pupils are equal and round.  No discharge. Neck: Normal range of motion. Neck supple. No JVD present. No thyromegaly present.  Cardiovascular: Normal rate, regular rhythm, normal heart sounds. Exam  reveals no gallop and no friction rub. No murmur heard.  Pulmonary/Chest: Effort normal and breath sounds normal. No stridor. No respiratory distress. He has no wheezes. He has no rales. He exhibits no tenderness.  Abdominal: Soft. Bowel sounds are normal. He exhibits no distension. There is no tenderness. There is no rebound and no guarding.  Musculoskeletal: Normal range of motion. He exhibits no edema and no tenderness.  Neurological: He is alert and oriented to person, place, and time. Coordination normal.  Skin: Skin is warm and dry. No rash noted. He is not diaphoretic. No erythema. No pallor.  Psychiatric: He has a normal mood and affect. His behavior is normal. Judgment and thought content normal.     Labs:   Lab Results  Component Value Date   WBC 11.1* 05/18/2015   HGB 15.8 05/18/2015   HCT 48.7 05/18/2015   MCV 83.5 05/18/2015   PLT 148* 05/18/2015    Recent Labs Lab 05/18/15 0346  NA 142  K 3.6  CL 111  CO2 24  BUN 18  CREATININE 0.75  CALCIUM 8.9  GLUCOSE 111*   Lab Results  Component Value Date   TROPONINI 0.04* 05/18/2015       EKG:  Sinus tachycardia with PVCs and poor R-wave progression in the anterior leads.    ASSESSMENT AND PLAN:   1. Atypical chest pain: The chest pain is overall atypical and does not seem to be anginal in nature. The patient nonetheless has multiple risk factors for coronary artery disease and has known cardiomyopathy with no recent ischemic cardiac evaluation. Thus, I recommend evaluation with a nuclear stress test tomorrow. I had a prolonged discussion with the patient about the importance of healthy lifestyle changes.  2. Chronic systolic heart failure: I reviewed the echocardiogram from August which showed an ejection fraction of 40-45% with moderate left ventricular hypertrophy and global hypokinesis and biatrial enlargement. These findings are suggestive of hypertensive heart disease. Unfortunately, the patient has not been  able to afford his medications. I recommend a case management consult and choosing generic medications only. I agree with carvedilol and lisinopril. He is probably going to need a small dose furosemide as well.   Signed: Kathlyn Sacramento MD, [facility] 05/18/2015, 9:37 AM

## 2015-05-18 NOTE — Plan of Care (Signed)
Problem: Phase I Progression Outcomes Goal: Anginal pain relieved Outcome: Completed/Met Date Met:  05/18/15 No chest pain noted during shift

## 2015-05-18 NOTE — Plan of Care (Signed)
Problem: Consults Goal: Tobacco Cessation referral if indicated Outcome: Progressing Patient stated that he is willing to take steps to quit smoking

## 2015-05-18 NOTE — Progress Notes (Signed)
Patient is having chest pain 7/10, dropped to 6/10 after giving 2mg  of morphine. Heart rate is in the 60's-70's. Both Dr. Fletcher Anon and Dr. Verdell Carmine notified. Cardiology is to see the patient.

## 2015-05-18 NOTE — Progress Notes (Signed)
Patient's diastolic blood pressure has been running high. Patient is asymptomatic and states "I keep trying to tell them that you're going to have to about knock me out to get that bottom number to come down". Current pressure is 144/117, labetolol orders state to keep pressure below 160/100. MD notified since systolic number is in the 140's. MD will put in one time order of hydralazine. Stress test consent has been signed. Currently chest pain has eased off some and patient does not request pain medication. Given morphine twice today. No other complaints. Will continue to monitor.

## 2015-05-19 ENCOUNTER — Encounter: Payer: Self-pay | Admitting: Radiology

## 2015-05-19 ENCOUNTER — Inpatient Hospital Stay (HOSPITAL_COMMUNITY): Payer: Self-pay

## 2015-05-19 DIAGNOSIS — R079 Chest pain, unspecified: Secondary | ICD-10-CM

## 2015-05-19 LAB — BASIC METABOLIC PANEL
Anion gap: 8 (ref 5–15)
BUN: 21 mg/dL — ABNORMAL HIGH (ref 6–20)
CO2: 29 mmol/L (ref 22–32)
Calcium: 9.3 mg/dL (ref 8.9–10.3)
Chloride: 106 mmol/L (ref 101–111)
Creatinine, Ser: 1.12 mg/dL (ref 0.61–1.24)
GFR calc Af Amer: >60 mL/min (ref >60)
GFR calc non Af Amer: >60 mL/min (ref >60)
Glucose, Bld: 88 mg/dL (ref 65–99)
Potassium: 4.2 mmol/L (ref 3.5–5.1)
Sodium: 143 mmol/L (ref 135–145)

## 2015-05-19 LAB — NM MYOCAR MULTI W/SPECT W/WALL MOTION / EF
Peak HR: 96 {beats}/min
Percent HR: 55 %
Rest HR: 73 {beats}/min

## 2015-05-19 MED ORDER — CARVEDILOL 12.5 MG PO TABS: 12.5000 mg | ORAL_TABLET | Freq: Two times a day (BID) | ORAL | Status: DC

## 2015-05-19 MED ORDER — TECHNETIUM TC 99M SESTAMIBI - CARDIOLITE
33.0000 | Freq: Once | INTRAVENOUS | Status: AC | PRN
  Administered 2015-05-19: 33 via INTRAVENOUS

## 2015-05-19 MED ORDER — TECHNETIUM TC 99M SESTAMIBI - CARDIOLITE
13.0000 | Freq: Once | INTRAVENOUS | Status: AC | PRN
  Administered 2015-05-19: 08:00:00 12.33 via INTRAVENOUS

## 2015-05-19 MED ORDER — FUROSEMIDE 40 MG PO TABS: 40.0000 mg | ORAL_TABLET | Freq: Every day | ORAL | Status: DC

## 2015-05-19 MED ORDER — REGADENOSON 0.4 MG/5ML IV SOLN
0.4000 mg | Freq: Once | INTRAVENOUS | Status: AC
  Administered 2015-05-19: 0.4 mg via INTRAVENOUS

## 2015-05-19 MED ORDER — POTASSIUM CHLORIDE ER 10 MEQ PO TBCR: 20.0000 meq | EXTENDED_RELEASE_TABLET | Freq: Every day | ORAL | Status: DC

## 2015-05-19 MED ORDER — LISINOPRIL 10 MG PO TABS: 40.0000 mg | ORAL_TABLET | Freq: Every day | ORAL | Status: DC

## 2015-05-19 NOTE — Progress Notes (Signed)
    He is for [employer] this morning. Chest pain is not bothering him "like it was." Results to follow. Blood pressure still mildly elevated at times, would recommend further titration of antihypertensives to goal of <120/80 if BP elevation persists.   Christell Faith, PA-C 05/19/2015 9:10 AM

## 2015-05-19 NOTE — Discharge Summary (Signed)
[facility] [place] NAME: Gabriel [name]    MR#:  [id number]  DATE OF BIRTH:  1971-01-24  DATE OF ADMISSION:  05/17/2015 ADMITTING PHYSICIAN: Lance Coon, MD  DATE OF DISCHARGE: 05/19/2015  2:00 PM  PRIMARY CARE PHYSICIAN: No PCP Per Patient    ADMISSION DIAGNOSIS:  Acute exacerbation of CHF (congestive heart failure) [I50.9] Hypertensive urgency [I10] Chest pain, moderate coronary artery risk [R07.9]  DISCHARGE DIAGNOSIS:  Principal Problem:   Acute on chronic combined systolic and diastolic CHF (congestive heart failure) Active Problems:   Accelerated hypertension   Type II diabetes mellitus   Hyperlipidemia   COPD (chronic obstructive pulmonary disease)   Unstable angina   SECONDARY DIAGNOSIS:   Past Medical History  Diagnosis Date  . Hypertension     Resolved since weight loss  . Obesity (BMI 30.0-34.9)   . Psoriasis   . Diabetes mellitus without complication     Pre diabetic  . CHF (congestive heart failure)   . COPD (chronic obstructive pulmonary disease)     HOSPITAL COURSE:   44 year old male with past medical history of hypertension, morbid obesity, diabetes type 2 without consultation, CHF, COPD, who presented to the hospital with shortness of breath and chest pain and noted to be in congestive heart failure.  #1 CHF-this was likely due to acute on chronic combined diastolic/systolic dysfunction. -This is likely secondary to medical noncompliance. As patient was not able to afford his medications. -While in the hospital patient was diuresed with IV Lasix and has clinically improved. His echocardiogram showed worsening ejection fraction of 45% but no acute all motion abnormalities. This is likely due to hypertensive cardiomyopathy -Since patient has improved he is currently being discharged on Coreg, lisinopril, Lasix, potassium supplements. He does have a follow-up coming up at the heart failure clinic here  in the hospital. -Patient was discharged on all medications that aregeneric which would be low cost him and at and on the  $4 [facility] list.   #2 chest pain-atypical and mild troponin elevation. -Patient did undergo nuclear medicine stress test which showed T-wave inversions in the lateral leads no ST segment deviation during stress part. He has small mild defect in the apex which was not significant for any acute perfusion abnormalities. He did have a mildly low ejection fraction of 30-44%. This was likely thought to be secondary to nonischemic and hypertensive cardiomyopathy. -Patient will continue his aspirin, Coreg, statin.  #3 hypertension-patient will continue Coreg, lisinopril.   #4 hyperlipidemia-patient will continue simvastatin   DISCHARGE CONDITIONS:   Stable  CONSULTS OBTAINED:  Treatment Team:  Wellington Hampshire, MD  DRUG ALLERGIES:   Allergies  Allergen Reactions  . Prednisone Other (See Comments)    Reaction: Hallucinations     DISCHARGE MEDICATIONS:   Discharge Medication List as of 05/19/2015  1:48 PM    START taking these medications   Details  potassium chloride (K-DUR) 10 MEQ tablet Take 2 tablets (20 mEq total) by mouth daily., Starting 05/19/2015, Until Discontinued, Print      CONTINUE these medications which have CHANGED   Details  !! carvedilol (COREG) 12.5 MG tablet Take 1 tablet (12.5 mg total) by mouth 2 (two) times daily with a meal., Starting 05/19/2015, Until Discontinued, Print    furosemide (LASIX) 40 MG tablet Take 1 tablet (40 mg total) by mouth daily., Starting 05/19/2015, Until Discontinued, Print    lisinopril (PRINIVIL,ZESTRIL) 10 MG tablet Take 4 tablets (40 mg total)  by mouth daily., Starting 05/19/2015, Until Discontinued, Print     !! - Potential duplicate medications found. Please discuss with provider.    CONTINUE these medications which have NOT CHANGED   Details  albuterol (PROVENTIL HFA;VENTOLIN HFA) 108 (90 BASE) MCG/ACT  inhaler Inhale 2 puffs into the lungs every 6 (six) hours as needed for wheezing or shortness of breath., Starting 04/27/2015, Until Discontinued, Print    aspirin EC 81 MG tablet Take 1 tablet (81 mg total) by mouth daily., Starting 03/21/2015, Until Discontinued, Normal    benzonatate (TESSALON PERLES) 100 MG capsule Take 1 capsule (100 mg total) by mouth 3 (three) times daily as needed for cough (Take 1-2 per dose)., Starting 04/27/2015, Until Discontinued, Print    !! carvedilol (COREG) 12.5 MG tablet Take 1 tablet (12.5 mg total) by mouth 2 (two) times daily with a meal., Starting 03/21/2015, Until Discontinued, Print    simvastatin (ZOCOR) 40 MG tablet Take 1 tablet (40 mg total) by mouth at bedtime., Starting 03/21/2015, Until Discontinued, Print     !! - Potential duplicate medications found. Please discuss with provider.       DISCHARGE INSTRUCTIONS:   DIET:  Cardiac diet  DISCHARGE CONDITION:  Stable  ACTIVITY:  Activity as tolerated  OXYGEN:  Home Oxygen: No.   Oxygen Delivery: room air  DISCHARGE LOCATION:  home   If you experience worsening of your admission symptoms, develop shortness of breath, life threatening emergency, suicidal or homicidal thoughts you must seek medical attention immediately by calling 911 or calling your MD immediately  if symptoms less severe.  You Must read complete instructions/literature along with all the possible adverse reactions/side effects for all the Medicines you take and that have been prescribed to you. Take any new Medicines after you have completely understood and accpet all the possible adverse reactions/side effects.   Please note  You were cared for by a hospitalist during your hospital stay. If you have any questions about your discharge medications or the care you received while you were in the hospital after you are discharged, you can call the unit and asked to speak with the hospitalist on call if the hospitalist that took  care of you is not available. Once you are discharged, your primary care physician will handle any further medical issues. Please note that NO REFILLS for any discharge medications will be authorized once you are discharged, as it is imperative that you return to your primary care physician (or establish a relationship with a primary care physician if you do not have one) for your aftercare needs so that they can reassess your need for medications and monitor your lab values.     Today   Patient still complaining of some mild shortness of breath but improved since admission. No chest pain, nausea, vomiting.  VITAL SIGNS:  Blood pressure 150/98, pulse 77, temperature 98.7 F (37.1 C), temperature source Oral, resp. rate 19, height 6' (1.829 m), weight 101.334 kg (223 lb 6.4 oz), SpO2 97 %.  I/O:   Intake/Output Summary (Last 24 hours) at 05/19/15 1628 Last data filed at 05/19/15 1043  Gross per 24 hour  Intake    480 ml  Output   1950 ml  Net  -1470 ml    PHYSICAL EXAMINATION:   GENERAL: 44 y.o.-year-old patient lying in the bed with no acute distress.  EYES: Pupils equal, round, reactive to light and accommodation. No scleral icterus. Extraocular muscles intact.  HEENT: Head atraumatic, normocephalic. Oropharynx and  nasopharynx clear.  NECK: Supple, no jugular venous distention. No thyroid enlargement, no tenderness.  LUNGS: Normal breath sounds bilaterally, no wheezing, rales, rhonchi. No use of accessory muscles of respiration.  CARDIOVASCULAR: S1, S2 normal. No murmurs, rubs, or gallops.  ABDOMEN: Soft, nontender, nondistended. Bowel sounds present. No organomegaly or mass.  EXTREMITIES: No cyanosis, clubbing, trace pedal Edema bilaterally.  NEUROLOGIC: Cranial nerves II through XII are intact. No focal Motor or sensory deficits b/l.  PSYCHIATRIC: The patient is alert and oriented x 3. Good affect SKIN: No obvious rash, lesion, or ulcer.     DATA REVIEW:    CBC  Recent Labs Lab 05/18/15 0346  WBC 11.1*  HGB 15.8  HCT 48.7  PLT 148*    Chemistries   Recent Labs Lab 05/19/15 0502  NA 143  K 4.2  CL 106  CO2 29  GLUCOSE 88  BUN 21*  CREATININE 1.12  CALCIUM 9.3    Cardiac Enzymes  Recent Labs Lab 05/18/15 0949  TROPONINI 0.04*     RADIOLOGY:  Dg Chest 2 View  05/17/2015   CLINICAL DATA:  Centralized chest pain since 5 p.m.  EXAM: CHEST  2 VIEW  COMPARISON:  04/27/2015  FINDINGS: The heart is enlarged but stable. The mediastinal and hilar contours are stable. Mild tortuosity of the thoracic aorta. Central vascular congestion and chronic interstitial changes with left midlung scarring. No definite infiltrates, edema or effusions. The bony thorax is intact.  IMPRESSION: Cardiac enlargement and central vascular congestion without overt pulmonary edema.  Chronic bronchitic changes and interstitial scarring but no acute infiltrate.   Electronically Signed   By: Marijo Sanes M.D.   On: 05/17/2015 19:18   Nm Myocar Multi W/spect W/wall Motion / Ef  05/19/2015    T wave inversion was noted during stress in the V5 and V6 leads.  There was no ST segment deviation noted during stress.  Defect 1: There is a small defect of mild severity present in the apex  location. this is likely due to apical thinning.  This is an intermediate risk study mainly due to cardiomyopathy  The left ventricular ejection fraction is moderately decreased (30-44%).  No evidence of significant perfusion abnormalities. Likely nonischemic  cardiomyopathy.       Management plans discussed with the patient, family and they are in agreement.  CODE STATUS:     Code Status Orders        Start     Ordered   05/17/15 2342  Full code   Continuous     05/17/15 2341      TOTAL TIME TAKING CARE OF THIS PATIENT: 40 minutes.    Henreitta Leber M.D on 05/19/2015 at 4:28 PM  Between 7am to 6pm - Pager - [phone]  After 6pm go to [email or web address] -  password [email or web address] [facility]  [facility]  Office  [phone]  CC: Primary care physician; No PCP Per Patient

## 2015-05-19 NOTE — Progress Notes (Signed)
Nuclear stress test showed no significant perfusion defects with moderately reduced LV systolic function likely due to hypertensive heart disease. No further ischemic workup is recommended. Recommend blood pressure control with carvedilol. Increased the dose of lisinopril to 40 mg once daily and continue with furosemide 40 mg once daily with potassium. A follow-up appointment was made with the heart failure clinic on September 13.

## 2015-05-19 NOTE — Progress Notes (Signed)
Followup appointment made at the [facility] on May 31, 2015 at 11:00am. Of note, he did not show up for his last appointment on 05/02/15. Thank you for the referral.

## 2015-05-19 NOTE — Clinical Social Work Note (Signed)
CSW acknowledges consult and attempted to see pt this morning but he was no in the room.  CSW will attempt to see pt again later today

## 2015-05-19 NOTE — Progress Notes (Signed)
Patient d/c'd home. Education provided, no questions at this time. Patient to be picked up by family friend. Telemetry removed. Decorey Wahlert R [place]  

## 2015-05-19 NOTE — Discharge Instructions (Signed)
Heart Failure Clinic appointment on May 31, 2015 at 11:00am with [name], [place]. Please call [phone] to reschedule.

## 2015-05-19 NOTE — Care Management (Signed)
[facility]  Gave patient the phone number to call just prior to discharge and instructed him to knock on the office door to be let in the pharmacy.  Scripts are all generic and were available off the [facility] four dollar list.  Due to the limited finances patient reports sent the scripts to the clinic and provided patient with the application as done on previous admission

## 2015-05-19 NOTE — Clinical Social Work Note (Signed)
CSW spoke to pt.  He was sitting up in bed A&O.  He stated that he did not need assistance with food, or shelter.  Pt denied any S/A abuse and stated that he didn't need anything from [place].  CSW signing off

## 2015-05-29 ENCOUNTER — Ambulatory Visit: Payer: MEDICAID | Attending: Family | Admitting: Family

## 2015-05-29 ENCOUNTER — Encounter: Payer: Self-pay | Admitting: Family

## 2015-05-29 VITALS — BP 124/91 | HR 79 | Resp 20 | Ht 72.0 in | Wt 221.0 lb

## 2015-05-29 DIAGNOSIS — Z6829 Body mass index (BMI) 29.0-29.9, adult: Secondary | ICD-10-CM | POA: Insufficient documentation

## 2015-05-29 DIAGNOSIS — R7303 Prediabetes: Secondary | ICD-10-CM | POA: Insufficient documentation

## 2015-05-29 DIAGNOSIS — E669 Obesity, unspecified: Secondary | ICD-10-CM | POA: Insufficient documentation

## 2015-05-29 DIAGNOSIS — F17200 Nicotine dependence, unspecified, uncomplicated: Secondary | ICD-10-CM

## 2015-05-29 DIAGNOSIS — I10 Essential (primary) hypertension: Secondary | ICD-10-CM | POA: Insufficient documentation

## 2015-05-29 DIAGNOSIS — E785 Hyperlipidemia, unspecified: Secondary | ICD-10-CM

## 2015-05-29 DIAGNOSIS — I5032 Chronic diastolic (congestive) heart failure: Secondary | ICD-10-CM

## 2015-05-29 DIAGNOSIS — L409 Psoriasis, unspecified: Secondary | ICD-10-CM | POA: Insufficient documentation

## 2015-05-29 DIAGNOSIS — J449 Chronic obstructive pulmonary disease, unspecified: Secondary | ICD-10-CM | POA: Insufficient documentation

## 2015-05-29 MED ORDER — SIMVASTATIN 40 MG PO TABS: 40.0000 mg | ORAL_TABLET | Freq: Every day | ORAL | Status: DC

## 2015-05-29 NOTE — Progress Notes (Signed)
Subjective:    Patient ID: Gabriel [name]., male    DOB: 21-Dec-1970, 44 y.o.   MRN: [id number]  Congestive Heart Failure Presents for follow-up visit. The disease course has been stable. Associated symptoms include chest pain (sometimes), fatigue, palpitations and shortness of breath. Pertinent negatives include no abdominal pain, chest pressure, edema, muscle weakness or orthopnea. The symptoms have been stable. Past treatments include ACE inhibitors, beta blockers and salt and fluid restriction. The treatment provided moderate relief. Compliance with prior treatments has been variable. Past compliance problems: doesn't take medication if his blood pressure gets low. His past medical history is significant for chronic lung disease and HTN. Compliance with total regimen is 76-100%. Compliance problems include medication side effects.   Chest Pain  This is a recurrent problem. The current episode started more than 1 month ago. The onset quality is sudden. The problem occurs intermittently. The problem has been unchanged. The pain is present in the lateral region. The pain is at a severity of 4/10. The pain is mild. The quality of the pain is described as sharp. The pain does not radiate. Associated symptoms include headaches, malaise/fatigue, palpitations and shortness of breath. Pertinent negatives include no abdominal pain, back pain, cough, diaphoresis, dizziness, exertional chest pressure, irregular heartbeat, leg pain, lower extremity edema, numbness or weakness. The pain is aggravated by nothing. He has tried rest and acetaminophen for the symptoms. The treatment provided moderate relief. Risk factors include male gender, smoking/tobacco exposure and stress.  His past medical history is significant for COPD, CHF, hyperlipidemia, HTN and hypertension.  Pertinent negatives for past medical history include no MI and no muscle weakness.  His family medical history is significant for diabetes and  hypertension. Prior diagnostic workup includes stress echo.  Hyperlipidemia This is a chronic problem. The current episode started more than 1 year ago. The problem is controlled. Recent lipid tests were reviewed and are normal. He has no history of chronic renal disease or liver disease. Associated symptoms include chest pain (sometimes) and shortness of breath. Pertinent negatives include no leg pain. He is currently on no antihyperlipidemic treatment. There are no compliance problems.  Risk factors for coronary artery disease include male sex.    Past Medical History  Diagnosis Date  . Hypertension     Resolved since weight loss  . Obesity (BMI 30.0-34.9)   . Psoriasis   . Diabetes mellitus without complication ([place])     Pre diabetic  . CHF (congestive heart failure) ([place])   . COPD (chronic obstructive pulmonary disease) [facility])     Past Surgical History  Procedure Laterality Date  . Amputation    . Finger amputation      Traumatic    Family History  Problem Relation Age of Onset  . Diabetes Mellitus II Mother   . Hypertension Father   . Cancer Paternal Aunt   . Cancer Maternal Grandfather     Social History  Substance Use Topics  . Smoking status: Current Every Day Smoker -- 0.25 packs/day for 33 years    Types: Cigarettes  . Smokeless tobacco: Never Used  . Alcohol Use: 1.8 oz/week    3 Cans of beer, 0 Standard drinks or equivalent per week     Comment: occassionally    Allergies  Allergen Reactions  . Prednisone Other (See Comments)    Reaction: Hallucinations     Prior to Admission medications   Medication Sig Start Date End Date Taking? Authorizing Provider  carvedilol (COREG)  12.5 MG tablet Take 1 tablet (12.5 mg total) by mouth 2 (two) times daily with a meal. 05/19/15  Yes Henreitta Leber, MD  furosemide (LASIX) 40 MG tablet Take 1 tablet (40 mg total) by mouth daily. 05/19/15  Yes Henreitta Leber, MD  lisinopril (PRINIVIL,ZESTRIL) 10 MG tablet Take 4  tablets (40 mg total) by mouth daily. 05/19/15  Yes Henreitta Leber, MD  potassium chloride (K-DUR) 10 MEQ tablet Take 2 tablets (20 mEq total) by mouth daily. 05/19/15  Yes Henreitta Leber, MD  albuterol (PROVENTIL HFA;VENTOLIN HFA) 108 (90 BASE) MCG/ACT inhaler Inhale 2 puffs into the lungs every 6 (six) hours as needed for wheezing or shortness of breath. Patient not taking: Reported on 05/29/2015 04/27/15   Alita Chyle Bacon Menshew, PA-C  simvastatin (ZOCOR) 40 MG tablet Take 1 tablet (40 mg total) by mouth at bedtime. 05/29/15   Alisa Graff, FNP     Review of Systems  Constitutional: Positive for malaise/fatigue and fatigue. Negative for diaphoresis and appetite change.  HENT: Positive for congestion. Negative for postnasal drip and sore throat.   Eyes: Positive for visual disturbance (need reading glasses). Negative for itching.  Respiratory: Positive for shortness of breath. Negative for cough and wheezing.   Cardiovascular: Positive for chest pain (sometimes) and palpitations. Negative for leg swelling.  Gastrointestinal: Negative for abdominal pain and abdominal distention.  Endocrine: Negative.   Genitourinary: Negative.   Musculoskeletal: Negative for back pain, muscle weakness and neck pain.  Skin: Negative.   Allergic/Immunologic: Negative.   Neurological: Positive for headaches. Negative for dizziness, weakness, light-headedness and numbness.  Hematological: Negative.   Psychiatric/Behavioral: Positive for dysphoric mood. Negative for sleep disturbance (no different than before). The patient is not nervous/anxious.        Objective:   Physical Exam  Constitutional: He is oriented to person, place, and time. He appears well-developed and well-nourished.  HENT:  Head: Normocephalic and atraumatic.  Eyes: Conjunctivae are normal. Pupils are equal, round, and reactive to light.  Neck: Normal range of motion. Neck supple.  Cardiovascular: Normal rate and regular rhythm.    Pulmonary/Chest: Effort normal and breath sounds normal. He has no wheezes. He has no rales.  Abdominal: Soft. He exhibits no distension. There is no tenderness.  Musculoskeletal: He exhibits no edema or tenderness.  Neurological: He is alert and oriented to person, place, and time.  Skin: Skin is warm and dry.  Psychiatric: He has a normal mood and affect. His behavior is normal. Thought content normal.  Nursing note and vitals reviewed.   BP 124/91 mmHg  Pulse 79  Resp 20  Ht 6' (1.829 m)  Wt 221 lb (100.245 kg)  BMI 29.97 kg/m2  SpO2 98%       Assessment & Plan:  1: Chronic heart failure with preserved ejection fraction- Patient presents after a recent hospitalization and feels like he is doing better. Does get tired and short of breath with some exertion although he denied any shortness of breath upon walking into the office today. He hasn't been weighing himself daily and he was instructed to resume weighing daily so that he can call for an overnight weight gain of >2 pounds or a weekly weight gain of >5 pounds. By our scale, he has lost 23 pounds since he was last here. He is not adding any salt to his food and is trying to follow a low sodium diet. Did have a stress test while in the hospital and those  results were reviewed with him.  2: HTN- Blood pressure is not bad today. Would like to see the diastolic number <15 at least. He says that when it gets <80, he become irritated and angry and it doesn't make him feel good so he will not take more of the medicine if it does that. Will leave everything the way it is today but discussed at least getting it below 90 and he seemed agreeable to that. 3: Hyperlipidemia- He hasn't been on simvastatin in years but says that it didn't bother him, he just stopped it when he stopped seeing any medical provider. Will resume at 40mg  daily and a prescription was given to him to [facility] as he was going there today. 4:  Tobacco- He says that he's really trying to quit and says that a pack of cigarettes will now last him an entire week. Complete cessation was discussed for 3 minutes with him.  [facility] Pharm[facility] spoke with patient and reviewed all his medications with him. He is to return here in 1 month or sooner for any questions/problems before then.

## 2015-05-29 NOTE — Patient Instructions (Addendum)
Continue weighing daily and call for an overnight weight gain of > 2 pounds or a weekly weight gain of >5 pounds.  Begin Zocor 40mg  daily.  Make an appointment at [facility] to get established.    Smoking Cessation Quitting smoking is important to your health and has many advantages. However, it is not always easy to quit since nicotine is a very addictive drug. Oftentimes, people try 3 times or more before being able to quit. This document explains the best ways for you to prepare to quit smoking. Quitting takes hard work and a lot of effort, but you can do it. ADVANTAGES OF QUITTING SMOKING  You will live longer, feel better, and live better.  Your body will feel the impact of quitting smoking almost immediately.  Within 20 minutes, blood pressure decreases. Your pulse returns to its normal level.  After 8 hours, carbon monoxide levels in the blood return to normal. Your oxygen level increases.  After 24 hours, the chance of having a heart attack starts to decrease. Your breath, hair, and body stop smelling like smoke.  After 48 hours, damaged nerve endings begin to recover. Your sense of taste and smell improve.  After 72 hours, the body is virtually free of nicotine. Your bronchial tubes relax and breathing becomes easier.  After 2 to 12 weeks, lungs can hold more air. Exercise becomes easier and circulation improves.  The risk of having a heart attack, stroke, cancer, or lung disease is greatly reduced.  After 1 year, the risk of coronary heart disease is cut in half.  After 5 years, the risk of stroke falls to the same as a nonsmoker.  After 10 years, the risk of lung cancer is cut in half and the risk of other cancers decreases significantly.  After 15 years, the risk of coronary heart disease drops, usually to the level of a nonsmoker.  If you are pregnant, quitting smoking will improve your chances of having a healthy baby.  The people you live with, especially any  children, will be healthier.  You will have extra money to spend on things other than cigarettes. QUESTIONS TO THINK ABOUT BEFORE ATTEMPTING TO QUIT You may want to talk about your answers with your health care provider.  Why do you want to quit?  If you tried to quit in the past, what helped and what did not?  What will be the most difficult situations for you after you quit? How will you plan to handle them?  Who can help you through the tough times? Your family? Friends? A health care provider?  What pleasures do you get from smoking? What ways can you still get pleasure if you quit? Here are some questions to ask your health care provider:  How can you help me to be successful at quitting?  What medicine do you think would be best for me and how should I take it?  What should I do if I need more help?  What is smoking withdrawal like? How can I get information on withdrawal? GET READY  Set a quit date.  Change your environment by getting rid of all cigarettes, ashtrays, matches, and lighters in your home, car, or work. Do not let people smoke in your home.  Review your past attempts to quit. Think about what worked and what did not. GET SUPPORT AND ENCOURAGEMENT You have a better chance of being successful if you have help. You can get support in many ways.  Tell your  family, friends, and coworkers that you are going to quit and need their support. Ask them not to smoke around you.  Get individual, group, or telephone counseling and support. Programs are available at [employer] and health centers. Call your local health department for information about programs in your area.  Spiritual beliefs and practices may help some smokers quit.  Download a "quit meter" on your computer to keep track of quit statistics, such as how long you have gone without smoking, cigarettes not smoked, and money saved.  Get a self-help book about quitting smoking and staying off  tobacco. [place] yourself from urges to smoke. Talk to someone, go for a walk, or occupy your time with a task.  Change your normal routine. Take a different route to work. Drink tea instead of coffee. Eat breakfast in a different place.  Reduce your stress. Take a hot bath, exercise, or read a book.  Plan something enjoyable to do every day. Reward yourself for not smoking.  Explore interactive web-based programs that specialize in helping you quit. GET MEDICINE AND USE IT CORRECTLY Medicines can help you stop smoking and decrease the urge to smoke. Combining medicine with the above behavioral methods and support can greatly increase your chances of successfully quitting smoking.  Nicotine replacement therapy helps deliver nicotine to your body without the negative effects and risks of smoking. Nicotine replacement therapy includes nicotine gum, lozenges, inhalers, nasal sprays, and skin patches. Some may be available over-the-counter and others require a prescription.  Antidepressant medicine helps people abstain from smoking, but how this works is unknown. This medicine is available by prescription.  Nicotinic receptor partial agonist medicine simulates the effect of nicotine in your brain. This medicine is available by prescription. Ask your health care provider for advice about which medicines to use and how to use them based on your health history. Your health care provider will tell you what side effects to look out for if you choose to be on a medicine or therapy. Carefully read the information on the package. Do not use any other product containing nicotine while using a nicotine replacement product.  RELAPSE OR DIFFICULT SITUATIONS Most relapses occur within the first 3 months after quitting. Do not be discouraged if you start smoking again. Remember, most people try several times before finally quitting. You may have symptoms of withdrawal because  your body is used to nicotine. You may crave cigarettes, be irritable, feel very hungry, cough often, get headaches, or have difficulty concentrating. The withdrawal symptoms are only temporary. They are strongest when you first quit, but they will go away within 10-14 days. To reduce the chances of relapse, try to:  Avoid drinking alcohol. Drinking lowers your chances of successfully quitting.  Reduce the amount of caffeine you consume. Once you quit smoking, the amount of caffeine in your body increases and can give you symptoms, such as a rapid heartbeat, sweating, and anxiety.  Avoid smokers because they can make you want to smoke.  Do not let weight gain distract you. Many smokers will gain weight when they quit, usually less than 10 pounds. Eat a healthy diet and stay active. You can always lose the weight gained after you quit.  Find ways to improve your mood other than smoking. FOR MORE INFORMATION  www.smokefree.gov  Document Released: 07/30/2001 Document Revised: 12/20/2013 Document Reviewed: 11/14/2011 [facility] Patient Information 2015 [place], [place]. This information is not intended to replace advice given to you  by your health care provider. Make sure you discuss any questions you have with your health care provider.

## 2015-05-31 ENCOUNTER — Ambulatory Visit: Payer: Self-pay | Admitting: Family

## 2015-07-03 ENCOUNTER — Ambulatory Visit: Payer: Self-pay | Admitting: Family

## 2015-07-03 ENCOUNTER — Telehealth: Payer: Self-pay | Admitting: Family

## 2015-07-03 NOTE — Telephone Encounter (Signed)
Patient did not show for his appointment at the [facility] on 07/03/15. Will attempt to reschedule.

## 2015-11-24 ENCOUNTER — Emergency Department
Admission: EM | Admit: 2015-11-24 | Discharge: 2015-11-24 | Disposition: A | Discharge status: Home / Self Care | Payer: Self-pay | Attending: Emergency Medicine | Admitting: Emergency Medicine

## 2015-11-24 ENCOUNTER — Encounter: Payer: Self-pay | Admitting: Emergency Medicine

## 2015-11-24 ENCOUNTER — Emergency Department: Payer: Self-pay

## 2015-11-24 DIAGNOSIS — Z7984 Long term (current) use of oral hypoglycemic drugs: Secondary | ICD-10-CM | POA: Insufficient documentation

## 2015-11-24 DIAGNOSIS — Z79899 Other long term (current) drug therapy: Secondary | ICD-10-CM | POA: Insufficient documentation

## 2015-11-24 DIAGNOSIS — I509 Heart failure, unspecified: Secondary | ICD-10-CM | POA: Insufficient documentation

## 2015-11-24 DIAGNOSIS — J449 Chronic obstructive pulmonary disease, unspecified: Secondary | ICD-10-CM | POA: Insufficient documentation

## 2015-11-24 DIAGNOSIS — F17210 Nicotine dependence, cigarettes, uncomplicated: Secondary | ICD-10-CM | POA: Insufficient documentation

## 2015-11-24 DIAGNOSIS — Z794 Long term (current) use of insulin: Secondary | ICD-10-CM | POA: Insufficient documentation

## 2015-11-24 DIAGNOSIS — E669 Obesity, unspecified: Secondary | ICD-10-CM | POA: Insufficient documentation

## 2015-11-24 DIAGNOSIS — E119 Type 2 diabetes mellitus without complications: Secondary | ICD-10-CM | POA: Insufficient documentation

## 2015-11-24 DIAGNOSIS — R079 Chest pain, unspecified: Secondary | ICD-10-CM | POA: Insufficient documentation

## 2015-11-24 DIAGNOSIS — R0600 Dyspnea, unspecified: Secondary | ICD-10-CM

## 2015-11-24 DIAGNOSIS — I110 Hypertensive heart disease with heart failure: Secondary | ICD-10-CM | POA: Insufficient documentation

## 2015-11-24 LAB — CBC
HCT: 47.9 % (ref 40.0–52.0)
Hemoglobin: 15.8 g/dL (ref 13.0–18.0)
MCH: 27.7 pg (ref 26.0–34.0)
MCHC: 33.1 g/dL (ref 32.0–36.0)
MCV: 83.8 fL (ref 80.0–100.0)
Platelets: 154 10*3/uL (ref 150–440)
RBC: 5.72 MIL/uL (ref 4.40–5.90)
RDW: 14.7 % — ABNORMAL HIGH (ref 11.5–14.5)
WBC: 9.9 10*3/uL (ref 3.8–10.6)

## 2015-11-24 LAB — BASIC METABOLIC PANEL
Anion gap: 5 (ref 5–15)
BUN: 16 mg/dL (ref 6–20)
CO2: 25 mmol/L (ref 22–32)
Calcium: 9.1 mg/dL (ref 8.9–10.3)
Chloride: 105 mmol/L (ref 101–111)
Creatinine, Ser: 0.86 mg/dL (ref 0.61–1.24)
GFR calc Af Amer: >60 mL/min (ref >60)
GFR calc non Af Amer: >60 mL/min (ref >60)
Glucose, Bld: 111 mg/dL — ABNORMAL HIGH (ref 65–99)
Potassium: 4.2 mmol/L (ref 3.5–5.1)
Sodium: 135 mmol/L (ref 135–145)

## 2015-11-24 LAB — TROPONIN I
Troponin I: 0.04 ng/mL — ABNORMAL HIGH (ref <0.031)
Troponin I: 0.05 ng/mL — ABNORMAL HIGH (ref <0.031)

## 2015-11-24 MED ORDER — POTASSIUM CHLORIDE ER 10 MEQ PO TBCR: 20.0000 meq | EXTENDED_RELEASE_TABLET | Freq: Every day | ORAL | Status: DC

## 2015-11-24 MED ORDER — FUROSEMIDE 40 MG PO TABS: 40.0000 mg | ORAL_TABLET | Freq: Every day | ORAL | Status: DC

## 2015-11-24 MED ORDER — ASPIRIN 81 MG PO CHEW
324.0000 mg | CHEWABLE_TABLET | Freq: Once | ORAL | Status: AC
  Administered 2015-11-24: 324 mg via ORAL
  Filled 2015-11-24: qty 4

## 2015-11-24 MED ORDER — LISINOPRIL 20 MG PO TABS: 40.0000 mg | ORAL_TABLET | Freq: Every day | ORAL | Status: DC

## 2015-11-24 MED ORDER — LISINOPRIL 10 MG PO TABS: 40.0000 mg | ORAL_TABLET | Freq: Once | ORAL | Status: DC

## 2015-11-24 MED ORDER — ASPIRIN EC 81 MG PO TBEC: 81.0000 mg | DELAYED_RELEASE_TABLET | Freq: Every day | ORAL | Status: DC

## 2015-11-24 MED ORDER — SIMVASTATIN 40 MG PO TABS: 40.0000 mg | ORAL_TABLET | Freq: Every evening | ORAL | Status: DC

## 2015-11-24 NOTE — ED Notes (Signed)
Dr. schaevitz at bedside 

## 2015-11-24 NOTE — ED Provider Notes (Addendum)
[facility] Emergency Department Provider Note  ____________________________________________  Time seen: Approximately 3 PM  I have reviewed the triage vital signs and the nursing notes.   HISTORY  Chief Complaint Chest Pain    HPI Gabriel [name]. is a 45 y.o. male with a history of hypertension, diabetes and CHF who is presenting to the emergency department with chest pain. He says his chest pain started at 7 AM this morning has been waxing and waning all day. He says it has been associated with shortness of breath. It lasts several minutes at a time and then is relieved. He denies any radiation. He says it is across his chest and feels like a pressure type pain. He is not experiencing any nausea, vomiting or diaphoresis with it. He says he has gone up flights of stairs today and it has not worsened or brought on the chest pain. He says he is currently pain-free at this time. He says he been off all medications over the past several months because of problems with insurance.   Past Medical History  Diagnosis Date  . Hypertension     Resolved since weight loss  . Obesity (BMI 30.0-34.9)   . Psoriasis   . Diabetes mellitus without complication ([place])     Pre diabetic  . CHF (congestive heart failure) ([place])   . COPD (chronic obstructive pulmonary disease) [facility])     Patient Active Problem List   Diagnosis Date Noted  . Chronic diastolic heart failure ([place]) 05/29/2015  . Acute on chronic combined systolic and diastolic CHF (congestive heart failure) ([place]) 05/17/2015  . Accelerated hypertension 05/17/2015  . Type II diabetes mellitus ([place]) 05/17/2015  . Hyperlipidemia 05/17/2015  . COPD (chronic obstructive pulmonary disease) ([place]) 05/17/2015  . Unstable angina ([place]) 05/17/2015  . HTN (hypertension) 03/31/2015  . Smoking 03/31/2015  . Chest pain 03/20/2015  . Acute CHF ([place]) 03/20/2015  . Elevated troponin 03/20/2015  . CHF (congestive heart failure)  ([place]) 03/20/2015    Past Surgical History  Procedure Laterality Date  . Amputation    . Finger amputation      Traumatic    Current Outpatient Rx  Name  Route  Sig  Dispense  Refill  . albuterol (PROVENTIL HFA;VENTOLIN HFA) 108 (90 BASE) MCG/ACT inhaler   Inhalation   Inhale 2 puffs into the lungs every 6 (six) hours as needed for wheezing or shortness of breath.   1 Inhaler   0   . carvedilol (COREG) 12.5 MG tablet   Oral   Take 1 tablet (12.5 mg total) by mouth 2 (two) times daily with a meal.   60 tablet   1   . furosemide (LASIX) 40 MG tablet   Oral   Take 1 tablet (40 mg total) by mouth daily.   60 tablet   1   . lisinopril (PRINIVIL,ZESTRIL) 10 MG tablet   Oral   Take 4 tablets (40 mg total) by mouth daily.   60 tablet   1   . potassium chloride (K-DUR) 10 MEQ tablet   Oral   Take 2 tablets (20 mEq total) by mouth daily.   30 tablet   1   . simvastatin (ZOCOR) 40 MG tablet   Oral   Take 1 tablet (40 mg total) by mouth at bedtime.   90 tablet   3     Allergies Prednisone  Family History  Problem Relation Age of Onset  . Diabetes Mellitus II Mother   .  Hypertension Father   . Cancer Paternal Aunt   . Cancer Maternal Grandfather     Social History Social History  Substance Use Topics  . Smoking status: Current Every Day Smoker -- 0.25 packs/day for 33 years    Types: Cigarettes  . Smokeless tobacco: Never Used  . Alcohol Use: 1.8 oz/week    3 Cans of beer, 0 Standard drinks or equivalent per week     Comment: occassionally    Review of Systems Constitutional: No fever/chills Eyes: No visual changes. ENT: No sore throat. Cardiovascular:As above Respiratory: As above Gastrointestinal: No abdominal pain.  No nausea, no vomiting.  No diarrhea.  No constipation. Genitourinary: Negative for dysuria. Musculoskeletal: Negative for back pain. Skin: Negative for rash. Neurological: Negative for headaches, focal weakness or  numbness.  10-point ROS otherwise negative.  ____________________________________________   PHYSICAL EXAM:  VITAL SIGNS: ED Triage Vitals  Enc Vitals Group     BP 11/24/15 1153 169/110 mmHg     Pulse Rate 11/24/15 1153 83     Resp 11/24/15 1153 18     Temp 11/24/15 1153 98.1 F (36.7 C)     Temp Source 11/24/15 1153 Oral     SpO2 11/24/15 1153 100 %     Weight 11/24/15 1153 225 lb (102.059 kg)     Height 11/24/15 1153 6\' 1"  (1.854 m)     Head Cir --      Peak Flow --      Pain Score 11/24/15 1153 5     Pain Loc --      Pain Edu? --      Excl. in [place]? --     Constitutional: Alert and oriented. Well appearing and in no acute distress. Eyes: Conjunctivae are normal. PERRL. EOMI. Head: Atraumatic. Nose: No congestion/rhinnorhea. Mouth/Throat: Mucous membranes are moist.   Neck: No stridor.   Cardiovascular: Normal rate, regular rhythm. Grossly normal heart sounds.  Good peripheral circulation. Respiratory: Normal respiratory effort.  No retractions. Lungs CTAB. Gastrointestinal: Soft and nontender. No distention. No abdominal bruits. No CVA tenderness. Musculoskeletal: No lower extremity tenderness nor edema.  No joint effusions. Neurologic:  Normal speech and language. No gross focal neurologic deficits are appreciated.  Skin:  Skin is warm, dry and intact. No rash noted. Psychiatric: Mood and affect are normal. Speech and behavior are normal.  ____________________________________________   LABS (all labs ordered are listed, but only abnormal results are displayed)  Labs Reviewed  BASIC METABOLIC PANEL - Abnormal; Notable for the following:    Glucose, Bld 111 (*)    All other components within normal limits  CBC - Abnormal; Notable for the following:    RDW 14.7 (*)    All other components within normal limits  TROPONIN I - Abnormal; Notable for the following:    Troponin I 0.04 (*)    All other components within normal limits  TROPONIN I    ____________________________________________  EKG  ED ECG REPORT I, Doran Stabler, the attending physician, personally viewed and interpreted this ECG.   Date: 11/24/2015  EKG Time: 1147  Rate: 81  Rhythm: normal sinus rhythm  Axis: Normal  Intervals:none  ST&T Change: No ST segment elevation or depression. No abnormal T-wave inversion.  ____________________________________________  RADIOLOGY   Imaging Results       DG Chest 2 View (Final result) Result time: 11/24/15 12:47:29   Final result by Rad Results In Interface (11/24/15 12:47:29)   Narrative:   CLINICAL DATA: Chest pain  EXAM: CHEST 2 VIEW  COMPARISON: May 17, 2015  FINDINGS: No pneumothorax. The heart size is borderline but stable. The hila and mediastinum are unchanged. No pulmonary nodules or masses. No overt edema.  IMPRESSION: No acute abnormalities.   Electronically Signed By: Dorise Bullion III M.D On: 11/24/2015 12:47     ____________________________________________   PROCEDURES   ____________________________________________   INITIAL IMPRESSION / ASSESSMENT AND PLAN / ED COURSE  Pertinent labs & imaging results that were available during my care of the patient were reviewed by me and considered in my medical decision making (see chart for details).  ----------------------------------------- 4:18 PM on 11/24/2015 -----------------------------------------  Patient is resting comfortably at this time and awaiting the results of the second troponin. I reviewed his records it appears that he was diagnosed with hypertensive cardiomyopathy this past September. He did have an abnormal stress test but did not proceed to catheter because of a thought that it was related to hypertensive cardiomyopathy.  He continues to be chest pain-free. Does not wish said the hospital as long as a second troponin is stable. He is aware that he does carry a high risk of cardiac  disease. He says that he does have a history of coronary artery disease and heart attacks in his family. He understands the risk of death and permanent disability of going home without a complete workup. He says he does not want to be discharged with a prescription for his Coreg because he says it made him feel lethargic. However, I was able to convince him to continue with Lasix, lisinopril potassium and Zocor. He will also be taking a daily aspirin. I'll giveprescriptions for discharge. He will also be following up with Dr. Sophronia Simas in the office.  Signed out to Dr. Kerman Passey. ____________________________________________   FINAL CLINICAL IMPRESSION(S) / ED DIAGNOSES  Chest pain. Shortness of breath.    Orbie Pyo, MD 11/24/15 1620  Patient continues to be chest pain-free and the second troponin came back at 0.05. Patient still wanted to go home. Essentially same troponin within the patient's normal range over time. Will be discharged home.  Patient knows that he may return at any point for any worsening or concerning symptoms.  Orbie Pyo, MD 11/24/15 1630

## 2015-11-24 NOTE — ED Notes (Signed)
C/o chest pain to center of chest. Onset of symptoms this morning at 0700.

## 2015-11-24 NOTE — ED Notes (Signed)
Pt standing in the doorway and reports being ready to leave. Pt refused medication and reports he just wants to leave. Pt verbalized understanding of medications and follow up. Pt ambulatory and in no acute distress.

## 2015-11-27 ENCOUNTER — Telehealth: Payer: Self-pay | Admitting: Cardiovascular Disease

## 2015-11-27 NOTE — Telephone Encounter (Signed)
Lmov for patient to call back and make ED fu from 11/24/15  Seen for CP

## 2015-12-14 ENCOUNTER — Emergency Department
Admission: EM | Admit: 2015-12-14 | Discharge: 2015-12-14 | Disposition: A | Discharge status: Home / Self Care | Payer: Self-pay | Attending: Emergency Medicine | Admitting: Emergency Medicine

## 2015-12-14 ENCOUNTER — Emergency Department: Payer: Self-pay

## 2015-12-14 ENCOUNTER — Encounter: Payer: Self-pay | Admitting: Emergency Medicine

## 2015-12-14 DIAGNOSIS — I5043 Acute on chronic combined systolic (congestive) and diastolic (congestive) heart failure: Secondary | ICD-10-CM | POA: Insufficient documentation

## 2015-12-14 DIAGNOSIS — E785 Hyperlipidemia, unspecified: Secondary | ICD-10-CM | POA: Insufficient documentation

## 2015-12-14 DIAGNOSIS — I110 Hypertensive heart disease with heart failure: Secondary | ICD-10-CM | POA: Insufficient documentation

## 2015-12-14 DIAGNOSIS — E669 Obesity, unspecified: Secondary | ICD-10-CM | POA: Insufficient documentation

## 2015-12-14 DIAGNOSIS — J449 Chronic obstructive pulmonary disease, unspecified: Secondary | ICD-10-CM | POA: Insufficient documentation

## 2015-12-14 DIAGNOSIS — E119 Type 2 diabetes mellitus without complications: Secondary | ICD-10-CM | POA: Insufficient documentation

## 2015-12-14 DIAGNOSIS — R0600 Dyspnea, unspecified: Secondary | ICD-10-CM

## 2015-12-14 DIAGNOSIS — F17210 Nicotine dependence, cigarettes, uncomplicated: Secondary | ICD-10-CM | POA: Insufficient documentation

## 2015-12-14 LAB — BASIC METABOLIC PANEL
Anion gap: 8 (ref 5–15)
BUN: 17 mg/dL (ref 6–20)
CO2: 21 mmol/L — ABNORMAL LOW (ref 22–32)
Calcium: 8.5 mg/dL — ABNORMAL LOW (ref 8.9–10.3)
Chloride: 109 mmol/L (ref 101–111)
Creatinine, Ser: 0.77 mg/dL (ref 0.61–1.24)
GFR calc Af Amer: >60 mL/min (ref >60)
GFR calc non Af Amer: >60 mL/min (ref >60)
Glucose, Bld: 101 mg/dL — ABNORMAL HIGH (ref 65–99)
Potassium: 4.3 mmol/L (ref 3.5–5.1)
Sodium: 138 mmol/L (ref 135–145)

## 2015-12-14 LAB — CBC WITH DIFFERENTIAL/PLATELET
Basophils Absolute: 0.2 10*3/uL — ABNORMAL HIGH (ref 0–0.1)
Basophils Relative: 2 %
Eosinophils Absolute: 0.1 10*3/uL (ref 0–0.7)
Eosinophils Relative: 1 %
HCT: 47 % (ref 40.0–52.0)
Hemoglobin: 15.7 g/dL (ref 13.0–18.0)
Lymphocytes Relative: 11 %
Lymphs Abs: 1 10*3/uL (ref 1.0–3.6)
MCH: 27.8 pg (ref 26.0–34.0)
MCHC: 33.4 g/dL (ref 32.0–36.0)
MCV: 83.2 fL (ref 80.0–100.0)
Monocytes Absolute: 0.7 10*3/uL (ref 0.2–1.0)
Monocytes Relative: 8 %
Neutro Abs: 7 10*3/uL — ABNORMAL HIGH (ref 1.4–6.5)
Neutrophils Relative %: 78 %
Platelets: 114 10*3/uL — ABNORMAL LOW (ref 150–440)
RBC: 5.65 MIL/uL (ref 4.40–5.90)
RDW: 15 % — ABNORMAL HIGH (ref 11.5–14.5)
WBC: 9 10*3/uL (ref 3.8–10.6)

## 2015-12-14 LAB — TROPONIN I: Troponin I: 0.06 ng/mL — ABNORMAL HIGH (ref <0.031)

## 2015-12-14 MED ORDER — FUROSEMIDE 40 MG PO TABS: 40.0000 mg | ORAL_TABLET | Freq: Every day | ORAL | Status: DC

## 2015-12-14 MED ORDER — PREDNISONE 20 MG PO TABS
40.0000 mg | ORAL_TABLET | ORAL | Status: DC
  Filled 2015-12-14: qty 2

## 2015-12-14 MED ORDER — CARVEDILOL 12.5 MG PO TABS: 12.5000 mg | ORAL_TABLET | Freq: Two times a day (BID) | ORAL | Status: DC

## 2015-12-14 MED ORDER — LISINOPRIL 20 MG PO TABS: 40.0000 mg | ORAL_TABLET | Freq: Every day | ORAL | Status: DC

## 2015-12-14 MED ORDER — NITROGLYCERIN 0.4 MG SL SUBL
0.4000 mg | SUBLINGUAL_TABLET | SUBLINGUAL | Status: DC | PRN
  Administered 2015-12-14: 0.4 mg via SUBLINGUAL
  Filled 2015-12-14: qty 1

## 2015-12-14 MED ORDER — AZITHROMYCIN 250 MG PO TABS: ORAL_TABLET | ORAL | Status: DC

## 2015-12-14 MED ORDER — IPRATROPIUM-ALBUTEROL 0.5-2.5 (3) MG/3ML IN SOLN
RESPIRATORY_TRACT | Status: AC
  Administered 2015-12-14: 9 mL via RESPIRATORY_TRACT
  Filled 2015-12-14: qty 6

## 2015-12-14 MED ORDER — FUROSEMIDE 10 MG/ML IJ SOLN
40.0000 mg | Freq: Once | INTRAMUSCULAR | Status: AC
  Administered 2015-12-14: 40 mg via INTRAVENOUS
  Filled 2015-12-14: qty 4

## 2015-12-14 MED ORDER — ALBUTEROL SULFATE HFA 108 (90 BASE) MCG/ACT IN AERS: 2.0000 | INHALATION_SPRAY | Freq: Four times a day (QID) | RESPIRATORY_TRACT | Status: DC | PRN

## 2015-12-14 MED ORDER — ASPIRIN EC 81 MG PO TBEC: 81.0000 mg | DELAYED_RELEASE_TABLET | Freq: Every day | ORAL | Status: DC

## 2015-12-14 MED ORDER — IPRATROPIUM-ALBUTEROL 0.5-2.5 (3) MG/3ML IN SOLN
9.0000 mL | Freq: Once | RESPIRATORY_TRACT | Status: AC
  Administered 2015-12-14: 9 mL via RESPIRATORY_TRACT
  Filled 2015-12-14: qty 9

## 2015-12-14 NOTE — Care Management Note (Signed)
Case Management Note  Patient Details  Name: Gabriel [name]. MRN: [id number] Date of Birth: 1970-10-01  Subjective/Objective:  Spoke to patient at bedside . He has not seen a PCP in several years. Just started work, and waiting for insurance to start so he can get a full workup with PCP.Left Medication Manage ment paperwork om chart for MD is scripts are written.                  Action/Plan:   Expected Discharge Date:                  Expected Discharge Plan:     In-House Referral:     Discharge planning Services     Post Acute Care Choice:    Choice offered to:     DME Arranged:    DME Agency:     HH Arranged:    [place] Agency:     Status of Service:     Medicare Important Message Given:    Date Medicare IM Given:    Medicare IM give by:    Date Additional Medicare IM Given:    Additional Medicare Important Message give by:     If discussed at [place] of Stay Meetings, dates discussed:    Additional Comments:  Beau Fanny, RN 12/14/2015, 9:36 AM

## 2015-12-14 NOTE — ED Notes (Signed)
Patient refusing EKG.  MD notified.

## 2015-12-14 NOTE — Discharge Instructions (Signed)
Heart Failure °Heart failure is a condition in which the heart has trouble pumping blood. This means your heart does not pump blood efficiently for your body to work well. In some cases of heart failure, fluid may back up into your lungs or you may have swelling (edema) in your lower legs. Heart failure is usually a long-term (chronic) condition. It is important for you to take good care of yourself and follow your health care provider's treatment plan. °CAUSES  °Some health conditions can cause heart failure. Those health conditions include: °· High blood pressure (hypertension). Hypertension causes the heart muscle to work harder than normal. When pressure in the blood vessels is high, the heart needs to pump (contract) with more force in order to circulate blood throughout the body. High blood pressure eventually causes the heart to become stiff and weak. °· Coronary artery disease (CAD). CAD is the buildup of cholesterol and fat (plaque) in the arteries of the heart. The blockage in the arteries deprives the heart muscle of oxygen and blood. This can cause chest pain and may lead to a heart attack. High blood pressure can also contribute to CAD. °· Heart attack (myocardial infarction). A heart attack occurs when one or more arteries in the heart become blocked. The loss of oxygen damages the muscle tissue of the heart. When this happens, part of the heart muscle dies. The injured tissue does not contract as well and weakens the heart's ability to pump blood. °· Abnormal heart valves. When the heart valves do not open and close properly, it can cause heart failure. This makes the heart muscle pump harder to keep the blood flowing. °· Heart muscle disease (cardiomyopathy or myocarditis). Heart muscle disease is damage to the heart muscle from a variety of causes. These can include drug or alcohol abuse, infections, or unknown reasons. These can increase the risk of heart failure. °· Lung disease. Lung disease  makes the heart work harder because the lungs do not work properly. This can cause a strain on the heart, leading it to fail. °· Diabetes. Diabetes increases the risk of heart failure. High blood sugar contributes to high fat (lipid) levels in the blood. Diabetes can also cause slow damage to tiny blood vessels that carry important nutrients to the heart muscle. When the heart does not get enough oxygen and food, it can cause the heart to become weak and stiff. This leads to a heart that does not contract efficiently. °· Other conditions can contribute to heart failure. These include abnormal heart rhythms, thyroid problems, and low blood counts (anemia). °Certain unhealthy behaviors can increase the risk of heart failure, including: °· Being overweight. °· Smoking or chewing tobacco. °· Eating foods high in fat and cholesterol. °· Abusing illicit drugs or alcohol. °· Lacking physical activity. °SYMPTOMS  °Heart failure symptoms may vary and can be hard to detect. Symptoms may include: °· Shortness of breath with activity, such as climbing stairs. °· Persistent cough. °· Swelling of the feet, ankles, legs, or abdomen. °· Unexplained weight gain. °· Difficulty breathing when lying flat (orthopnea). °· Waking from sleep because of the need to sit up and get more air. °· Rapid heartbeat. °· Fatigue and loss of energy. °· Feeling light-headed, dizzy, or close to fainting. °· Loss of appetite. °· Nausea. °· Increased urination during the night (nocturia). °DIAGNOSIS  °A diagnosis of heart failure is based on your history, symptoms, physical examination, and diagnostic tests. Diagnostic tests for heart failure may include: °·   Echocardiography.  Electrocardiography.  Chest X-ray.  Blood tests.  Exercise stress test.  Cardiac angiography.  Radionuclide scans. TREATMENT  Treatment is aimed at managing the symptoms of heart failure. Medicines, behavioral changes, or surgical intervention may be necessary to  treat heart failure.  Medicines to help treat heart failure may include:  Angiotensin-converting enzyme (ACE) inhibitors. This type of medicine blocks the effects of a blood protein called angiotensin-converting enzyme. ACE inhibitors relax (dilate) the blood vessels and help lower blood pressure.  Angiotensin receptor blockers (ARBs). This type of medicine blocks the actions of a blood protein called angiotensin. Angiotensin receptor blockers dilate the blood vessels and help lower blood pressure.  Water pills (diuretics). Diuretics cause the kidneys to remove salt and water from the blood. The extra fluid is removed through urination. This loss of extra fluid lowers the volume of blood the heart pumps.  Beta blockers. These prevent the heart from beating too fast and improve heart muscle strength.  Digitalis. This increases the force of the heartbeat.  Healthy behavior changes include:  Obtaining and maintaining a healthy weight.  Stopping smoking or chewing tobacco.  Eating heart-healthy foods.  Limiting or avoiding alcohol.  Stopping illicit drug use.  Physical activity as directed by your health care provider.  Surgical treatment for heart failure may include:  A procedure to open blocked arteries, repair damaged heart valves, or remove damaged heart muscle tissue.  A pacemaker to improve heart muscle function and control certain abnormal heart rhythms.  An internal cardioverter defibrillator to treat certain serious abnormal heart rhythms.  A left ventricular assist device (LVAD) to assist the pumping ability of the heart. HOME CARE INSTRUCTIONS   Take medicines only as directed by your health care provider. Medicines are important in reducing the workload of your heart, slowing the progression of heart failure, and improving your symptoms.  Do not stop taking your medicine unless directed by your health care provider.  Do not skip any dose of medicine.  Refill your  prescriptions before you run out of medicine. Your medicines are needed every day.  Engage in moderate physical activity if directed by your health care provider. Moderate physical activity can benefit some people. The elderly and people with severe heart failure should consult with a health care provider for physical activity recommendations.  Eat heart-healthy foods. Food choices should be free of trans fat and low in saturated fat, cholesterol, and salt (sodium). Healthy choices include fresh or frozen fruits and vegetables, fish, lean meats, legumes, fat-free or low-fat dairy products, and whole grain or high fiber foods. Talk to a dietitian to learn more about heart-healthy foods.  Limit sodium if directed by your health care provider. Sodium restriction may reduce symptoms of heart failure in some people. Talk to a dietitian to learn more about heart-healthy seasonings.  Use healthy cooking methods. Healthy cooking methods include roasting, grilling, broiling, baking, poaching, steaming, or stir-frying. Talk to a dietitian to learn more about healthy cooking methods.  Limit fluids if directed by your health care provider. Fluid restriction may reduce symptoms of heart failure in some people.  Weigh yourself every day. Daily weights are important in the early recognition of excess fluid. You should weigh yourself every morning after you urinate and before you eat breakfast. Wear the same amount of clothing each time you weigh yourself. Record your daily weight. Provide your health care provider with your weight record.  Monitor and record your blood pressure if directed by your health care  provider. °· Check your pulse if directed by your health care provider. °· Lose weight if directed by your health care provider. Weight loss may reduce symptoms of heart failure in some people. °· Stop smoking or chewing tobacco. Nicotine makes your heart work harder by causing your blood vessels to constrict.  Do not use nicotine gum or patches before talking to your health care provider. °· Keep all follow-up visits as directed by your health care provider. This is important. °· Limit alcohol intake to no more than 1 drink per day for nonpregnant women and 2 drinks per day for men. One drink equals 12 ounces of beer, 5 ounces of wine, or 1½ ounces of hard liquor. Drinking more than that is harmful to your heart. Tell your health care provider if you drink alcohol several times a week. Talk with your health care provider about whether alcohol is safe for you. If your heart has already been damaged by alcohol or you have severe heart failure, drinking alcohol should be stopped completely. °· Stop illicit drug use. °· Stay up-to-date with immunizations. It is especially important to prevent respiratory infections through current pneumococcal and influenza immunizations. °· Manage other health conditions such as hypertension, diabetes, thyroid disease, or abnormal heart rhythms as directed by your health care provider. °· Learn to manage stress. °· Plan rest periods when fatigued. °· Learn strategies to manage high temperatures. If the weather is extremely hot: °¨ Avoid vigorous physical activity. °¨ Use air conditioning or fans or seek a cooler location. °¨ Avoid caffeine and alcohol. °¨ Wear loose-fitting, lightweight, and light-colored clothing. °· Learn strategies to manage cold temperatures. If the weather is extremely cold: °¨ Avoid vigorous physical activity. °¨ Layer clothes. °¨ Wear mittens or gloves, a hat, and a scarf when going outside. °¨ Avoid alcohol. °· Obtain ongoing education and support as needed. °· Participate in or seek rehabilitation as needed to maintain or improve independence and quality of life. °SEEK MEDICAL CARE IF:  °· You have a rapid weight gain. °· You have increasing shortness of breath that is unusual for you. °· You are unable to participate in your usual physical activities. °· You tire  easily. °· You cough more than normal, especially with physical activity. °· You have any or more swelling in areas such as your hands, feet, ankles, or abdomen. °· You are unable to sleep because it is hard to breathe. °· You feel like your heart is beating fast (palpitations). °· You become dizzy or light-headed upon standing up. °SEEK IMMEDIATE MEDICAL CARE IF:  °· You have difficulty breathing. °· There is a change in mental status such as decreased alertness or difficulty with concentration. °· You have a pain or discomfort in your chest. °· You have an episode of fainting (syncope). °MAKE SURE YOU:  °· Understand these instructions. °· Will watch your condition. °· Will get help right away if you are not doing well or get worse. °  °This information is not intended to replace advice given to you by your health care provider. Make sure you discuss any questions you have with your health care provider. °  °Document Released: 08/05/2005 Document Revised: 12/20/2014 Document Reviewed: 09/04/2012 °Elsevier Interactive Patient Education ©2016 [employer] ° ° °Shortness of Breath °Shortness of breath means you have trouble breathing. It could also mean that you have a medical problem. You should get immediate medical care for shortness of breath. °CAUSES  °· Not enough oxygen in the air such   as with high altitudes or a smoke-filled room. °· Certain lung diseases, infections, or problems. °· Heart disease or conditions, such as angina or heart failure. °· Low red blood cells (anemia). °· Poor physical fitness, which can cause shortness of breath when you exercise. °· Chest or back injuries or stiffness. °· Being overweight. °· Smoking. °· Anxiety, which can make you feel like you are not getting enough air. °DIAGNOSIS  °Serious medical problems can often be found during your physical exam. Tests may also be done to determine why you are having shortness of breath. Tests may include: °· Chest X-rays. °· Lung function  tests. °· Blood tests. °· An electrocardiogram (ECG). °· An ambulatory electrocardiogram. An ambulatory ECG records your heartbeat patterns over a 24-hour period. °· Exercise testing. °· A transthoracic echocardiogram (TTE). During echocardiography, sound waves are used to evaluate how blood flows through your heart. °· A transesophageal echocardiogram (TEE). °· Imaging scans. °Your health care provider may not be able to find a cause for your shortness of breath after your exam. In this case, it is important to have a follow-up exam with your health care provider as directed.  °TREATMENT  °Treatment for shortness of breath depends on the cause of your symptoms and can vary greatly. °HOME CARE INSTRUCTIONS  °· Do not smoke. Smoking is a common cause of shortness of breath. If you smoke, ask for help to quit. °· Avoid being around chemicals or things that may bother your breathing, such as paint fumes and dust. °· Rest as needed. Slowly resume your usual activities. °· If medicines were prescribed, take them as directed for the full length of time directed. This includes oxygen and any inhaled medicines. °· Keep all follow-up appointments as directed by your health care provider. °SEEK MEDICAL CARE IF:  °· Your condition does not improve in the time expected. °· You have a hard time doing your normal activities even with rest. °· You have any new symptoms. °SEEK IMMEDIATE MEDICAL CARE IF:  °· Your shortness of breath gets worse. °· You feel light-headed, faint, or develop a cough not controlled with medicines. °· You start coughing up blood. °· You have pain with breathing. °· You have chest pain or pain in your arms, shoulders, or abdomen. °· You have a fever. °· You are unable to walk up stairs or exercise the way you normally do. °MAKE SURE YOU: °· Understand these instructions. °· Will watch your condition. °· Will get help right away if you are not doing well or get worse. °  °This information is not intended to  replace advice given to you by your health care provider. Make sure you discuss any questions you have with your health care provider. °  °Document Released: 04/30/2001 Document Revised: 08/10/2013 Document Reviewed: 10/21/2011 °Elsevier Interactive Patient Education ©2016 Elsevier Inc. ° °

## 2015-12-14 NOTE — ED Notes (Addendum)
Pt with productive cough since yesterday with brown sputum. Tolerating fluids. Hypertensive, did not take meds today.

## 2015-12-14 NOTE — ED Provider Notes (Signed)
[facility] Emergency Department Provider Note  ____________________________________________  Time seen: 9:40 AM  I have reviewed the triage vital signs and the nursing notes.   HISTORY  Chief Complaint Cough    HPI Taden Stile. is a 45 y.o. male who complains of gradual onset of shortness of breath with productive cough since yesterday. Like his lungs are wheezy. He's not had a doctor since 2014 and has been off medications for which he is previously been treated for hypertension and CHF and COPD.States he stopped taking Lasix because he didn't like the way it made him feel. Denies chest pain. No exertional symptoms. No fevers chills vomiting or syncope.     Past Medical History  Diagnosis Date  . Hypertension     Resolved since weight loss  . Obesity (BMI 30.0-34.9)   . Psoriasis   . Diabetes mellitus without complication ([place])     Pre diabetic  . CHF (congestive heart failure) ([place])   . COPD (chronic obstructive pulmonary disease) [facility])      Patient Active Problem List   Diagnosis Date Noted  . Chronic diastolic heart failure ([place]) 05/29/2015  . Acute on chronic combined systolic and diastolic CHF (congestive heart failure) ([place]) 05/17/2015  . Accelerated hypertension 05/17/2015  . Type II diabetes mellitus ([place]) 05/17/2015  . Hyperlipidemia 05/17/2015  . COPD (chronic obstructive pulmonary disease) ([place]) 05/17/2015  . Unstable angina ([place]) 05/17/2015  . HTN (hypertension) 03/31/2015  . Smoking 03/31/2015  . Chest pain 03/20/2015  . Acute CHF ([place]) 03/20/2015  . Elevated troponin 03/20/2015  . CHF (congestive heart failure) ([place]) 03/20/2015     Past Surgical History  Procedure Laterality Date  . Amputation    . Finger amputation      Traumatic     Current Outpatient Rx  Name  Route  Sig  Dispense  Refill  . albuterol (PROVENTIL HFA;VENTOLIN HFA) 108 (90 Base) MCG/ACT inhaler   Inhalation   Inhale 2 puffs into the lungs  every 6 (six) hours as needed for wheezing or shortness of breath.   1 Inhaler   0   . aspirin EC 81 MG tablet   Oral   Take 1 tablet (81 mg total) by mouth daily.   30 tablet   0   . azithromycin (ZITHROMAX Z-PAK) 250 MG tablet      Take 2 tablets (500 mg) on  Day 1,  followed by 1 tablet (250 mg) once daily on Days 2 through 5.   6 each   0   . carvedilol (COREG) 12.5 MG tablet   Oral   Take 1 tablet (12.5 mg total) by mouth 2 (two) times daily with a meal.   60 tablet   0   . furosemide (LASIX) 40 MG tablet   Oral   Take 1 tablet (40 mg total) by mouth daily.   30 tablet   0   . lisinopril (PRINIVIL,ZESTRIL) 20 MG tablet   Oral   Take 2 tablets (40 mg total) by mouth daily.   60 tablet   0   . potassium chloride (K-DUR) 10 MEQ tablet   Oral   Take 2 tablets (20 mEq total) by mouth daily.   60 tablet   0   . simvastatin (ZOCOR) 40 MG tablet   Oral   Take 1 tablet (40 mg total) by mouth every evening.   30 tablet   0      Allergies Prednisone   Family  History  Problem Relation Age of Onset  . Diabetes Mellitus II Mother   . Hypertension Father   . Cancer Paternal Aunt   . Cancer Maternal Grandfather     Social History Social History  Substance Use Topics  . Smoking status: Current Every Day Smoker -- 0.25 packs/day for 33 years    Types: Cigarettes  . Smokeless tobacco: Never Used  . Alcohol Use: 1.8 oz/week    3 Cans of beer, 0 Standard drinks or equivalent per week     Comment: occassionally    Review of Systems  Constitutional:   No fever or chills.  Eyes:   No vision changes.  ENT:   No sore throat. No rhinorrhea. Cardiovascular:   No chest pain. Respiratory:   Positive shortness of breath and productive cough. Gastrointestinal:   Negative for abdominal pain, vomiting and diarrhea.  Genitourinary:   Negative for dysuria or difficulty urinating. Musculoskeletal:   Negative for focal pain or swelling Neurological:   Negative for  headaches 10-point ROS otherwise negative.  ____________________________________________   PHYSICAL EXAM:  VITAL SIGNS: ED Triage Vitals  Enc Vitals Group     BP 12/14/15 0924 190/127 mmHg     Pulse Rate 12/14/15 0924 90     Resp 12/14/15 1130 23     Temp 12/14/15 0924 98.1 F (36.7 C)     Temp Source 12/14/15 0924 Oral     SpO2 12/14/15 0924 95 %     Weight 12/14/15 0924 221 lb (100.245 kg)     Height 12/14/15 0924 6\' 1"  (1.854 m)     Head Cir --      Peak Flow --      Pain Score --      Pain Loc --      Pain Edu? --      Excl. in [place]? --     Vital signs reviewed, nursing assessments reviewed.   Constitutional:   Alert and oriented. Well appearing and in no distress. Eyes:   No scleral icterus. No conjunctival pallor. PERRL. EOMI.  No nystagmus. ENT   Head:   Normocephalic and atraumatic.   Nose:   No congestion/rhinnorhea. No septal hematoma   Mouth/Throat:   MMM, no pharyngeal erythema. No peritonsillar mass.    Neck:   No stridor. No SubQ emphysema. No meningismus. No JVD Hematological/Lymphatic/Immunilogical:   No cervical lymphadenopathy. Cardiovascular:   RRR. Symmetric bilateral radial and DP pulses.  No murmurs.  Respiratory:   Normal respiratory effort without tachypnea nor retractions. Diffuse expiratory wheezing. No rales or rhonchi. Normal expiratory phase Gastrointestinal:   Soft and nontender. Non distended. There is no CVA tenderness.  No rebound, rigidity, or guarding. Genitourinary:   deferred Musculoskeletal:   Nontender with normal range of motion in all extremities. No joint effusions.  No lower extremity tenderness.  No edema. Neurologic:   Normal speech and language.  CN 2-10 normal. Motor grossly intact. No gross focal neurologic deficits are appreciated.  Skin:    Skin is warm, dry and intact. No rash noted.  No petechiae, purpura, or bullae.  ____________________________________________    LABS (pertinent  positives/negatives) (all labs ordered are listed, but only abnormal results are displayed) Labs Reviewed  BASIC METABOLIC PANEL - Abnormal; Notable for the following:    CO2 21 (*)    Glucose, Bld 101 (*)    Calcium 8.5 (*)    All other components within normal limits  CBC WITH DIFFERENTIAL/PLATELET - Abnormal; Notable for the  following:    RDW 15.0 (*)    Platelets 114 (*)    Neutro Abs 7.0 (*)    Basophils Absolute 0.2 (*)    All other components within normal limits  TROPONIN I - Abnormal; Notable for the following:    Troponin I 0.06 (*)    All other components within normal limits  CBC WITH DIFFERENTIAL/PLATELET   ____________________________________________   EKG  Patient refuses to allow the EKG to be performed   ____________________________________________    RADIOLOGY  Mild bibasilar interstitial infiltrates concerning for minimal to mild pulmonary edema. No overt cardiomegaly. No pleural effusion.  ____________________________________________   PROCEDURES   ____________________________________________   INITIAL IMPRESSION / ASSESSMENT AND PLAN / ED COURSE  Pertinent labs & imaging results that were available during my care of the patient were reviewed by me and considered in my medical decision making (see chart for details).  Patient is well-appearing no acute distress, concern for community acquired pneumonia versus COPD exacerbation versus CHF. Check labs and EKG which is more consistent with CHF exacerbation. Patient reports that he is to be on medication for this but did not likely Lasix made him feel so he stopped taking it. He was given sublingual nitroglycerin in the emergency department as well as IV Lasix. After urinating a large amount, he states that he feels much better and that his hands feel "less tight ". His oxygenation is stable and he is not in distress. I'll restart him on his home medications. He's been provided an application for the  medication management clinic to assist with his prescriptions. Given referral information for primary care. The patient reports that he just started a new job and will have health insurance very soon and will follow-up with a doctor.     ____________________________________________   FINAL CLINICAL IMPRESSION(S) / ED DIAGNOSES  Final diagnoses:  Acute on chronic combined systolic and diastolic CHF (congestive heart failure) ([place])  Dyspnea       Portions of this note were generated with dragon dictation software. Dictation errors may occur despite best attempts at proofreading.   Carrie Mew, MD 12/14/15 1452

## 2016-01-03 ENCOUNTER — Emergency Department: Payer: Commercial Managed Care - HMO

## 2016-01-03 ENCOUNTER — Inpatient Hospital Stay
Admission: EM | Admit: 2016-01-03 | Discharge: 2016-01-09 | DRG: 871 | Disposition: A | Discharge status: Home / Self Care | Payer: Commercial Managed Care - HMO | Attending: Internal Medicine | Admitting: Internal Medicine

## 2016-01-03 ENCOUNTER — Encounter: Payer: Self-pay | Admitting: Emergency Medicine

## 2016-01-03 DIAGNOSIS — R140 Abdominal distension (gaseous): Secondary | ICD-10-CM

## 2016-01-03 DIAGNOSIS — Z833 Family history of diabetes mellitus: Secondary | ICD-10-CM

## 2016-01-03 DIAGNOSIS — I4729 Other ventricular tachycardia: Secondary | ICD-10-CM | POA: Insufficient documentation

## 2016-01-03 DIAGNOSIS — I472 Ventricular tachycardia: Secondary | ICD-10-CM | POA: Diagnosis present

## 2016-01-03 DIAGNOSIS — A419 Sepsis, unspecified organism: Secondary | ICD-10-CM | POA: Diagnosis present

## 2016-01-03 DIAGNOSIS — E785 Hyperlipidemia, unspecified: Secondary | ICD-10-CM | POA: Diagnosis present

## 2016-01-03 DIAGNOSIS — R17 Unspecified jaundice: Secondary | ICD-10-CM

## 2016-01-03 DIAGNOSIS — Z8249 Family history of ischemic heart disease and other diseases of the circulatory system: Secondary | ICD-10-CM | POA: Diagnosis not present

## 2016-01-03 DIAGNOSIS — J440 Chronic obstructive pulmonary disease with acute lower respiratory infection: Secondary | ICD-10-CM | POA: Diagnosis present

## 2016-01-03 DIAGNOSIS — R0600 Dyspnea, unspecified: Secondary | ICD-10-CM | POA: Diagnosis not present

## 2016-01-03 DIAGNOSIS — I471 Supraventricular tachycardia: Secondary | ICD-10-CM | POA: Diagnosis present

## 2016-01-03 DIAGNOSIS — L409 Psoriasis, unspecified: Secondary | ICD-10-CM | POA: Diagnosis present

## 2016-01-03 DIAGNOSIS — R55 Syncope and collapse: Secondary | ICD-10-CM | POA: Diagnosis present

## 2016-01-03 DIAGNOSIS — I959 Hypotension, unspecified: Secondary | ICD-10-CM | POA: Diagnosis not present

## 2016-01-03 DIAGNOSIS — M62830 Muscle spasm of back: Secondary | ICD-10-CM | POA: Diagnosis present

## 2016-01-03 DIAGNOSIS — I248 Other forms of acute ischemic heart disease: Secondary | ICD-10-CM | POA: Diagnosis present

## 2016-01-03 DIAGNOSIS — Z452 Encounter for adjustment and management of vascular access device: Secondary | ICD-10-CM

## 2016-01-03 DIAGNOSIS — Z7982 Long term (current) use of aspirin: Secondary | ICD-10-CM

## 2016-01-03 DIAGNOSIS — Z79899 Other long term (current) drug therapy: Secondary | ICD-10-CM | POA: Diagnosis not present

## 2016-01-03 DIAGNOSIS — A394 Meningococcemia, unspecified: Principal | ICD-10-CM | POA: Diagnosis present

## 2016-01-03 DIAGNOSIS — K7200 Acute and subacute hepatic failure without coma: Secondary | ICD-10-CM | POA: Diagnosis present

## 2016-01-03 DIAGNOSIS — I5022 Chronic systolic (congestive) heart failure: Secondary | ICD-10-CM | POA: Diagnosis present

## 2016-01-03 DIAGNOSIS — A399 Meningococcal infection, unspecified: Secondary | ICD-10-CM | POA: Insufficient documentation

## 2016-01-03 DIAGNOSIS — J189 Pneumonia, unspecified organism: Secondary | ICD-10-CM | POA: Diagnosis not present

## 2016-01-03 DIAGNOSIS — E669 Obesity, unspecified: Secondary | ICD-10-CM | POA: Diagnosis present

## 2016-01-03 DIAGNOSIS — Z23 Encounter for immunization: Secondary | ICD-10-CM | POA: Diagnosis not present

## 2016-01-03 DIAGNOSIS — R778 Other specified abnormalities of plasma proteins: Secondary | ICD-10-CM | POA: Insufficient documentation

## 2016-01-03 DIAGNOSIS — Z6833 Body mass index (BMI) 33.0-33.9, adult: Secondary | ICD-10-CM

## 2016-01-03 DIAGNOSIS — I110 Hypertensive heart disease with heart failure: Secondary | ICD-10-CM | POA: Diagnosis present

## 2016-01-03 DIAGNOSIS — I429 Cardiomyopathy, unspecified: Secondary | ICD-10-CM | POA: Diagnosis present

## 2016-01-03 DIAGNOSIS — R6521 Severe sepsis with septic shock: Secondary | ICD-10-CM | POA: Diagnosis present

## 2016-01-03 DIAGNOSIS — K760 Fatty (change of) liver, not elsewhere classified: Secondary | ICD-10-CM | POA: Diagnosis present

## 2016-01-03 DIAGNOSIS — R7989 Other specified abnormal findings of blood chemistry: Secondary | ICD-10-CM | POA: Diagnosis not present

## 2016-01-03 DIAGNOSIS — E860 Dehydration: Secondary | ICD-10-CM | POA: Diagnosis present

## 2016-01-03 DIAGNOSIS — E119 Type 2 diabetes mellitus without complications: Secondary | ICD-10-CM | POA: Diagnosis present

## 2016-01-03 DIAGNOSIS — I4510 Unspecified right bundle-branch block: Secondary | ICD-10-CM | POA: Diagnosis present

## 2016-01-03 DIAGNOSIS — N170 Acute kidney failure with tubular necrosis: Secondary | ICD-10-CM | POA: Diagnosis present

## 2016-01-03 DIAGNOSIS — I5032 Chronic diastolic (congestive) heart failure: Secondary | ICD-10-CM | POA: Diagnosis not present

## 2016-01-03 DIAGNOSIS — I5042 Chronic combined systolic (congestive) and diastolic (congestive) heart failure: Secondary | ICD-10-CM | POA: Insufficient documentation

## 2016-01-03 DIAGNOSIS — F17210 Nicotine dependence, cigarettes, uncomplicated: Secondary | ICD-10-CM | POA: Diagnosis present

## 2016-01-03 DIAGNOSIS — I9589 Other hypotension: Secondary | ICD-10-CM | POA: Diagnosis present

## 2016-01-03 DIAGNOSIS — E871 Hypo-osmolality and hyponatremia: Secondary | ICD-10-CM | POA: Diagnosis present

## 2016-01-03 LAB — BASIC METABOLIC PANEL
Anion gap: 11 (ref 5–15)
BUN: 17 mg/dL (ref 6–20)
CO2: 21 mmol/L — ABNORMAL LOW (ref 22–32)
Calcium: 8 mg/dL — ABNORMAL LOW (ref 8.9–10.3)
Chloride: 100 mmol/L — ABNORMAL LOW (ref 101–111)
Creatinine, Ser: 0.94 mg/dL (ref 0.61–1.24)
GFR calc Af Amer: >60 mL/min (ref >60)
GFR calc non Af Amer: >60 mL/min (ref >60)
Glucose, Bld: 115 mg/dL — ABNORMAL HIGH (ref 65–99)
Potassium: 3.4 mmol/L — ABNORMAL LOW (ref 3.5–5.1)
Sodium: 132 mmol/L — ABNORMAL LOW (ref 135–145)

## 2016-01-03 LAB — CBC
HCT: 44.9 % (ref 40.0–52.0)
Hemoglobin: 15.1 g/dL (ref 13.0–18.0)
MCH: 27.2 pg (ref 26.0–34.0)
MCHC: 33.5 g/dL (ref 32.0–36.0)
MCV: 81.1 fL (ref 80.0–100.0)
Platelets: 163 10*3/uL (ref 150–440)
RBC: 5.54 MIL/uL (ref 4.40–5.90)
RDW: 14.9 % — ABNORMAL HIGH (ref 11.5–14.5)
WBC: 28.2 10*3/uL — ABNORMAL HIGH (ref 3.8–10.6)

## 2016-01-03 LAB — URINALYSIS COMPLETE WITH MICROSCOPIC (ARMC ONLY)
Bilirubin Urine: NEGATIVE
Glucose, UA: NEGATIVE mg/dL
Ketones, ur: NEGATIVE mg/dL
Leukocytes, UA: NEGATIVE
Nitrite: NEGATIVE
Protein, ur: 100 mg/dL — AB
Specific Gravity, Urine: 1.013 (ref 1.005–1.030)
pH: 5 (ref 5.0–8.0)

## 2016-01-03 LAB — TROPONIN I
Troponin I: 0.22 ng/mL — ABNORMAL HIGH (ref <0.031)
Troponin I: 0.19 ng/mL — ABNORMAL HIGH (ref <0.031)
Troponin I: 0.18 ng/mL — ABNORMAL HIGH (ref <0.031)

## 2016-01-03 LAB — LACTIC ACID, PLASMA
Lactic Acid, Venous: 2 mmol/L (ref 0.5–2.0)
Lactic Acid, Venous: 3.6 mmol/L (ref 0.5–2.0)

## 2016-01-03 MED ORDER — SODIUM CHLORIDE 0.9 % IV BOLUS (SEPSIS)
500.0000 mL | Freq: Once | INTRAVENOUS | Status: AC
  Administered 2016-01-03: 500 mL via INTRAVENOUS

## 2016-01-03 MED ORDER — OXYCODONE HCL 5 MG PO TABS
5.0000 mg | ORAL_TABLET | ORAL | Status: DC | PRN
  Administered 2016-01-03 – 2016-01-04 (×2): 5 mg via ORAL
  Filled 2016-01-03 (×2): qty 1

## 2016-01-03 MED ORDER — LISINOPRIL 20 MG PO TABS
40.0000 mg | ORAL_TABLET | Freq: Every day | ORAL | Status: DC
  Administered 2016-01-04: 40 mg via ORAL
  Filled 2016-01-03: qty 2

## 2016-01-03 MED ORDER — HYDRALAZINE HCL 20 MG/ML IJ SOLN
10.0000 mg | INTRAMUSCULAR | Status: DC | PRN
  Administered 2016-01-03: 10 mg via INTRAVENOUS
  Filled 2016-01-03: qty 1

## 2016-01-03 MED ORDER — DEXTROSE 5 % IV SOLN
1.0000 g | INTRAVENOUS | Status: DC
  Administered 2016-01-04: 1 g via INTRAVENOUS
  Filled 2016-01-03: qty 10

## 2016-01-03 MED ORDER — SIMVASTATIN 40 MG PO TABS
40.0000 mg | ORAL_TABLET | Freq: Every evening | ORAL | Status: DC
  Administered 2016-01-03 – 2016-01-08 (×5): 40 mg via ORAL
  Filled 2016-01-03 (×5): qty 1

## 2016-01-03 MED ORDER — ONDANSETRON HCL 4 MG PO TABS: 4.0000 mg | ORAL_TABLET | Freq: Four times a day (QID) | ORAL | Status: DC | PRN

## 2016-01-03 MED ORDER — ASPIRIN EC 81 MG PO TBEC
81.0000 mg | DELAYED_RELEASE_TABLET | Freq: Every day | ORAL | Status: DC
  Administered 2016-01-04 – 2016-01-09 (×6): 81 mg via ORAL
  Filled 2016-01-03 (×6): qty 1

## 2016-01-03 MED ORDER — MORPHINE SULFATE (PF) 2 MG/ML IV SOLN
2.0000 mg | INTRAVENOUS | Status: DC | PRN
  Administered 2016-01-03 – 2016-01-04 (×3): 2 mg via INTRAVENOUS
  Filled 2016-01-03 (×3): qty 1

## 2016-01-03 MED ORDER — ONDANSETRON HCL 4 MG/2ML IJ SOLN
INTRAMUSCULAR | Status: AC
Start: 2016-01-03 — End: 2016-01-04
  Filled 2016-01-03: qty 2

## 2016-01-03 MED ORDER — ACETAMINOPHEN 650 MG RE SUPP: 650.0000 mg | Freq: Four times a day (QID) | RECTAL | Status: DC | PRN

## 2016-01-03 MED ORDER — SODIUM CHLORIDE 0.9 % IV BOLUS (SEPSIS)
1000.0000 mL | Freq: Once | INTRAVENOUS | Status: AC
  Administered 2016-01-03: 1000 mL via INTRAVENOUS

## 2016-01-03 MED ORDER — ENOXAPARIN SODIUM 40 MG/0.4ML ~~LOC~~ SOLN
40.0000 mg | SUBCUTANEOUS | Status: DC
Start: 2016-01-03 — End: 2016-01-04
  Administered 2016-01-03: 40 mg via SUBCUTANEOUS
  Filled 2016-01-03: qty 0.4

## 2016-01-03 MED ORDER — ACETAMINOPHEN 325 MG PO TABS
650.0000 mg | ORAL_TABLET | Freq: Four times a day (QID) | ORAL | Status: DC | PRN
  Administered 2016-01-06 – 2016-01-08 (×2): 650 mg via ORAL
  Filled 2016-01-03 (×2): qty 2

## 2016-01-03 MED ORDER — DEXTROSE 5 % IV SOLN
500.0000 mg | Freq: Once | INTRAVENOUS | Status: AC
  Administered 2016-01-03: 500 mg via INTRAVENOUS
  Filled 2016-01-03: qty 500

## 2016-01-03 MED ORDER — DEXTROSE 5 % IV SOLN
1.0000 g | Freq: Once | INTRAVENOUS | Status: AC
  Administered 2016-01-03: 1 g via INTRAVENOUS
  Filled 2016-01-03: qty 10

## 2016-01-03 MED ORDER — PNEUMOCOCCAL VAC POLYVALENT 25 MCG/0.5ML IJ INJ
0.5000 mL | INJECTION | INTRAMUSCULAR | Status: AC
  Administered 2016-01-06: 0.5 mL via INTRAMUSCULAR
  Filled 2016-01-03: qty 0.5

## 2016-01-03 MED ORDER — SODIUM CHLORIDE 0.9 % IV SOLN
INTRAVENOUS | Status: DC
  Administered 2016-01-03: 16:00:00 via INTRAVENOUS

## 2016-01-03 MED ORDER — ACETAMINOPHEN 325 MG PO TABS
650.0000 mg | ORAL_TABLET | Freq: Once | ORAL | Status: AC
  Administered 2016-01-03: 650 mg via ORAL
  Filled 2016-01-03: qty 2

## 2016-01-03 MED ORDER — GUAIFENESIN-CODEINE 100-10 MG/5ML PO SOLN
10.0000 mL | ORAL | Status: DC | PRN
  Administered 2016-01-03 – 2016-01-09 (×7): 10 mL via ORAL
  Filled 2016-01-03 (×7): qty 10

## 2016-01-03 MED ORDER — DEXTROSE 5 % IV SOLN
500.0000 mg | INTRAVENOUS | Status: DC
  Administered 2016-01-04: 500 mg via INTRAVENOUS
  Filled 2016-01-03: qty 500

## 2016-01-03 MED ORDER — SODIUM CHLORIDE 0.9% FLUSH
3.0000 mL | Freq: Two times a day (BID) | INTRAVENOUS | Status: DC
  Administered 2016-01-04 – 2016-01-09 (×12): 3 mL via INTRAVENOUS

## 2016-01-03 MED ORDER — CARVEDILOL 12.5 MG PO TABS
12.5000 mg | ORAL_TABLET | Freq: Two times a day (BID) | ORAL | Status: DC
  Administered 2016-01-03 – 2016-01-04 (×2): 12.5 mg via ORAL
  Filled 2016-01-03 (×2): qty 1

## 2016-01-03 MED ORDER — MORPHINE SULFATE (PF) 2 MG/ML IV SOLN
INTRAVENOUS | Status: AC
  Filled 2016-01-03: qty 1

## 2016-01-03 MED ORDER — IPRATROPIUM-ALBUTEROL 0.5-2.5 (3) MG/3ML IN SOLN
3.0000 mL | RESPIRATORY_TRACT | Status: DC
  Administered 2016-01-03 – 2016-01-04 (×7): 3 mL via RESPIRATORY_TRACT
  Filled 2016-01-03 (×7): qty 3

## 2016-01-03 MED ORDER — ALBUTEROL SULFATE (2.5 MG/3ML) 0.083% IN NEBU: 2.5000 mg | INHALATION_SOLUTION | Freq: Four times a day (QID) | RESPIRATORY_TRACT | Status: DC | PRN

## 2016-01-03 MED ORDER — ONDANSETRON HCL 4 MG/2ML IJ SOLN
4.0000 mg | Freq: Four times a day (QID) | INTRAMUSCULAR | Status: DC | PRN
  Administered 2016-01-03 – 2016-01-04 (×3): 4 mg via INTRAVENOUS
  Filled 2016-01-03 (×2): qty 2

## 2016-01-03 NOTE — Consult Note (Signed)
Pharmacy Antibiotic Note  Tania Frieling. is a 45 y.o. male admitted on 01/03/2016 with pneumonia.  Pharmacy has been consulted for ceftriaxone/azithromycin dosing.  Plan: ceftriaxone 1g q 24 hours, azithromycin 500mg  q 24 hours  Height: 6\' 1"  (185.4 cm) Weight: 225 lb (102.059 kg) IBW/kg (Calculated) : 79.9  Temp (24hrs), Avg:101.2 F (38.4 C), Min:101.2 F (38.4 C), Max:101.2 F (38.4 C)   Recent Labs Lab 01/03/16 1218  WBC 28.2*  CREATININE 0.94    Estimated Creatinine Clearance: 123.3 mL/min (by C-G formula based on Cr of 0.94).    Allergies  Allergen Reactions  . Prednisone Other (See Comments)    Reaction: Hallucinations     Antimicrobials this admission: ceftriaxone 5/17 >>  azithromycin 5/17 >>   Dose adjustments this admission:   Microbiology results: 5/17 BCx: pending 5/17 UCx: pending  Chest x-ray shows right upper lobe PNA  Thank you for allowing pharmacy to be a part of this patient's care.  Ramond Dial, Pharm.D Clinical Pharmacist   01/03/2016 2:30 PM

## 2016-01-03 NOTE — ED Notes (Signed)
Called [facility] to report Code Sepsis  Spoke to [employer]  [phone]

## 2016-01-03 NOTE — ED Notes (Signed)
Patient transported to X-ray 

## 2016-01-03 NOTE — H&P (Addendum)
[facility] NAME: Gabriel [name]    MR#:  [id number]  DATE OF BIRTH:  Aug 23, 1970   DATE OF ADMISSION:  01/03/2016  PRIMARY CARE PHYSICIAN: No PCP Per Patient   REQUESTING/REFERRING PHYSICIAN: [place]  CHIEF COMPLAINT:   Chief Complaint  Patient presents with  . Chest Pain    HISTORY OF PRESENT ILLNESS:  Gabriel [name]  is a 45 y.o. male with a known history of copd non oxygen requiring, presenting with cough and chest pain. Cough productive 2 weeks, with occasional shortness of breath now right sided chest pain. Sharp in quality, non radiating, 9/10 pleuritic, no relieving factors, also worse with cough. In ED - febrile - code sepsis called  PAST MEDICAL HISTORY:   Past Medical History  Diagnosis Date  . Hypertension     Resolved since weight loss  . Obesity (BMI 30.0-34.9)   . Psoriasis   . Diabetes mellitus without complication ([place])     Pre diabetic  . CHF (congestive heart failure) ([place])   . COPD (chronic obstructive pulmonary disease) ([place])     PAST SURGICAL HISTORY:   Past Surgical History  Procedure Laterality Date  . Amputation    . Finger amputation      Traumatic    SOCIAL HISTORY:   Social History  Substance Use Topics  . Smoking status: Current Every Day Smoker -- 0.25 packs/day for 33 years    Types: Cigarettes  . Smokeless tobacco: Never Used  . Alcohol Use: 1.8 oz/week    3 Cans of beer, 0 Standard drinks or equivalent per week     Comment: occassionally    FAMILY HISTORY:   Family History  Problem Relation Age of Onset  . Diabetes Mellitus II Mother   . Hypertension Father   . Cancer Paternal Aunt   . Cancer Maternal Grandfather     DRUG ALLERGIES:   Allergies  Allergen Reactions  . Prednisone Other (See Comments)    Reaction: Hallucinations     REVIEW OF SYSTEMS:  REVIEW OF SYSTEMS:  CONSTITUTIONAL: positive fevers, chills, fatigue, weakness.  EYES: Denies blurred  vision, double vision, or eye pain.  EARS, NOSE, THROAT: Denies tinnitus, ear pain, hearing loss.  RESPIRATORY: positive cough, shortness of breath, denies wheezing  CARDIOVASCULAR: positive chest pain,denies  palpitations, edema.  GASTROINTESTINAL: Denies nausea, vomiting, diarrhea, abdominal pain.  GENITOURINARY: Denies dysuria, hematuria.  ENDOCRINE: Denies nocturia or thyroid problems. HEMATOLOGIC AND LYMPHATIC: Denies easy bruising or bleeding.  SKIN: Denies rash or lesions.  MUSCULOSKELETAL: Denies pain in neck, back, shoulder, knees, hips, or further arthritic symptoms.  NEUROLOGIC: Denies paralysis, paresthesias.  PSYCHIATRIC: Denies anxiety or depressive symptoms. Otherwise full review of systems performed by me is negative.   MEDICATIONS AT HOME:   Prior to Admission medications   Medication Sig Start Date End Date Taking? Authorizing Provider  albuterol (PROVENTIL HFA;VENTOLIN HFA) 108 (90 Base) MCG/ACT inhaler Inhale 2 puffs into the lungs every 6 (six) hours as needed for wheezing or shortness of breath. 12/14/15   Carrie Mew, MD  aspirin EC 81 MG tablet Take 1 tablet (81 mg total) by mouth daily. 12/14/15 12/13/16  Carrie Mew, MD  carvedilol (COREG) 12.5 MG tablet Take 1 tablet (12.5 mg total) by mouth 2 (two) times daily with a meal. 12/14/15   Carrie Mew, MD  furosemide (LASIX) 40 MG tablet Take 1 tablet (40 mg total) by mouth daily. 12/14/15 12/13/16  Carrie Mew, MD  lisinopril (  PRINIVIL,ZESTRIL) 20 MG tablet Take 2 tablets (40 mg total) by mouth daily. 12/14/15 12/13/16  Carrie Mew, MD  potassium chloride (K-DUR) 10 MEQ tablet Take 2 tablets (20 mEq total) by mouth daily. 11/24/15   Orbie Pyo, MD  simvastatin (ZOCOR) 40 MG tablet Take 1 tablet (40 mg total) by mouth every evening. 11/24/15 11/23/16  Orbie Pyo, MD      VITAL SIGNS:  Blood pressure 135/87, pulse 94, temperature 101.2 F (38.4 C), temperature source Oral, resp.  rate 24, height 6\' 1"  (1.854 m), weight 225 lb (102.059 kg), SpO2 95 %.  PHYSICAL EXAMINATION:  VITAL SIGNS: Filed Vitals:   01/03/16 1202 01/03/16 1425  BP: 162/100 135/87  Pulse: 106 94  Temp: 101.2 F (38.4 C)   Resp: 24    GENERAL:44 y.o.male currently ill appearing HEAD: Normocephalic, atraumatic.  EYES: Pupils equal, round, reactive to light. Extraocular muscles intact. No scleral icterus.  MOUTH: Moist mucosal membrane. Dentition intact. No abscess noted.  EAR, NOSE, THROAT: Clear without exudates. No external lesions.  NECK: Supple. No thyromegaly. No nodules. No JVD.  PULMONARY: scattered rhonchi right lung field without wheeze r No use of accessory muscles, Good respiratory effort. good air entry bilaterally CHEST: Nontender to palpation.  CARDIOVASCULAR: S1 and S2. tachycardiac No murmurs, rubs, or gallops. No edema. Pedal pulses 2+ bilaterally.  GASTROINTESTINAL: Soft, nontender, nondistended. No masses. Positive bowel sounds. No hepatosplenomegaly.  MUSCULOSKELETAL: No swelling, clubbing, or edema. Range of motion full in all extremities.  NEUROLOGIC: Cranial nerves II through XII are intact. No gross focal neurological deficits. Sensation intact. Reflexes intact.  SKIN: No ulceration, lesions, rashes, or cyanosis. Skin warm and dry. Turgor intact.  PSYCHIATRIC: Mood, affect within normal limits. The patient is awake, alert and oriented x 3. Insight, judgment intact.    LABORATORY PANEL:   CBC  Recent Labs Lab 01/03/16 1218  WBC 28.2*  HGB 15.1  HCT 44.9  PLT 163   ------------------------------------------------------------------------------------------------------------------  Chemistries   Recent Labs Lab 01/03/16 1218  NA 132*  K 3.4*  CL 100*  CO2 21*  GLUCOSE 115*  BUN 17  CREATININE 0.94  CALCIUM 8.0*   ------------------------------------------------------------------------------------------------------------------  Cardiac  Enzymes  Recent Labs Lab 01/03/16 1218  TROPONINI 0.22*   ------------------------------------------------------------------------------------------------------------------  RADIOLOGY:  Dg Chest 2 View  01/03/2016  CLINICAL DATA:  Patient states that he has had worsening cough over past 2 weeks which has worsened today. Patient now has right side chest pain, SOB, diarrhea, and nausea. EXAM: CHEST  2 VIEW COMPARISON:  None. FINDINGS: There is right upper lobe consolidation. There is bilateral chronic interstitial lung disease. There is no pleural effusion or pneumothorax. The heart and mediastinal contours are unremarkable. The osseous structures are unremarkable. IMPRESSION: Right upper lobe pneumonia. Followup PA and lateral chest X-ray is recommended in 3-4 weeks following trial of antibiotic therapy to ensure resolution and exclude underlying malignancy. Electronically Signed   By: Kathreen Devoid   On: 01/03/2016 12:52    EKG:   Orders placed or performed during the hospital encounter of 01/03/16  . EKG 12-Lead  . EKG 12-Lead  . ED EKG within 10 minutes  . ED EKG within 10 minutes    IMPRESSION AND PLAN:   45 year old male history of copd presenting with cough, chest pain  1. Sepsis, meeting septic criteria by HR, RR, temp, wbc present on arrival. Source Community acquired pneumonia Code sepsis called. Panculture. Broad-spectrum antibiotics including ceftriaxone/azithromycin and taper antibiotics when  culture data returns.  Has received a 30 mL/kg IV fluid bolus. Continue IV fluid hydration to keep mean arterial pressure greater than 65. may require pressor therapy if blood pressure worsens. We will repeat lactic acid if the initial is greater than 2.2.  Oxygen, duonebs no indication for steroids at this time given no wheezing  2. Elevated troponin: tele, asa, statin, trend cardiac enzymes - suspect demand however if increasing will place on therapeutic lovenox 3. Chronic diastolic  chf: coreg, lisinopril, monitor fluid status cautious of volume overload 4. Essential hypertension: continue meds as above, add hydralazine 5. Hyperlipidemia unspecified: statin 6. Venous thromboembolism prophylactics: lovenox  7. Tobacco abuse: 4 mins counseling    All the records are reviewed and case discussed with ED provider. Management plans discussed with the patient, family and they are in agreement.  CODE STATUS: full  TOTAL TIME TAKING CARE OF THIS PATIENT: 45 minutes.    Hower,  Karenann Cai.D on 01/03/2016 at 2:28 PM  Between 7am to 6pm - Pager - [phone]  After 6pm: House Pager: - [facility]  Office  [phone]  CC: Primary care physician; No PCP Per Patient

## 2016-01-03 NOTE — ED Provider Notes (Signed)
[facility] Emergency Department Provider Note   ____________________________________________  Time seen: Approximately 1:30 PM  I have reviewed the triage vital signs and the nursing notes.   HISTORY  Chief Complaint Chest Pain   HPI Gabriel [name]. is a 45 y.o. male with a history of hypertension, CHF and COPD who is presenting today with 2 weeks of cough and has now developed right-sided chest pain which is constant, sharp and a 10 out of 10. He says the cough is nonproductive. Has had a history of pneumonia. Has not been admitted to the hospital in the past 3 months.   Past Medical History  Diagnosis Date  . Hypertension     Resolved since weight loss  . Obesity (BMI 30.0-34.9)   . Psoriasis   . Diabetes mellitus without complication ([place])     Pre diabetic  . CHF (congestive heart failure) ([place])   . COPD (chronic obstructive pulmonary disease) [facility])     Patient Active Problem List   Diagnosis Date Noted  . Chronic diastolic heart failure ([place]) 05/29/2015  . Acute on chronic combined systolic and diastolic CHF (congestive heart failure) ([place]) 05/17/2015  . Accelerated hypertension 05/17/2015  . Type II diabetes mellitus ([place]) 05/17/2015  . Hyperlipidemia 05/17/2015  . COPD (chronic obstructive pulmonary disease) ([place]) 05/17/2015  . Unstable angina ([place]) 05/17/2015  . HTN (hypertension) 03/31/2015  . Smoking 03/31/2015  . Chest pain 03/20/2015  . Acute CHF ([place]) 03/20/2015  . Elevated troponin 03/20/2015  . CHF (congestive heart failure) ([place]) 03/20/2015    Past Surgical History  Procedure Laterality Date  . Amputation    . Finger amputation      Traumatic    Current Outpatient Rx  Name  Route  Sig  Dispense  Refill  . albuterol (PROVENTIL HFA;VENTOLIN HFA) 108 (90 Base) MCG/ACT inhaler   Inhalation   Inhale 2 puffs into the lungs every 6 (six) hours as needed for wheezing or shortness of breath.   1 Inhaler   0   .  aspirin EC 81 MG tablet   Oral   Take 1 tablet (81 mg total) by mouth daily.   30 tablet   0   . azithromycin (ZITHROMAX Z-PAK) 250 MG tablet      Take 2 tablets (500 mg) on  Day 1,  followed by 1 tablet (250 mg) once daily on Days 2 through 5.   6 each   0   . carvedilol (COREG) 12.5 MG tablet   Oral   Take 1 tablet (12.5 mg total) by mouth 2 (two) times daily with a meal.   60 tablet   0   . furosemide (LASIX) 40 MG tablet   Oral   Take 1 tablet (40 mg total) by mouth daily.   30 tablet   0   . lisinopril (PRINIVIL,ZESTRIL) 20 MG tablet   Oral   Take 2 tablets (40 mg total) by mouth daily.   60 tablet   0   . potassium chloride (K-DUR) 10 MEQ tablet   Oral   Take 2 tablets (20 mEq total) by mouth daily.   60 tablet   0   . simvastatin (ZOCOR) 40 MG tablet   Oral   Take 1 tablet (40 mg total) by mouth every evening.   30 tablet   0     Allergies Prednisone  Family History  Problem Relation Age of Onset  . Diabetes Mellitus II Mother   .  Hypertension Father   . Cancer Paternal Aunt   . Cancer Maternal Grandfather     Social History Social History  Substance Use Topics  . Smoking status: Current Every Day Smoker -- 0.25 packs/day for 33 years    Types: Cigarettes  . Smokeless tobacco: Never Used  . Alcohol Use: 1.8 oz/week    3 Cans of beer, 0 Standard drinks or equivalent per week     Comment: occassionally    Review of Systems Constitutional: No fever/chills Eyes: No visual changes. ENT: No sore throat. Cardiovascular: As above Respiratory: As above Gastrointestinal: No abdominal pain.  No nausea, no vomiting.  No diarrhea.  No constipation. Genitourinary: Negative for dysuria. Musculoskeletal: Negative for back pain. Skin: Negative for rash. Neurological: Negative for headaches, focal weakness or numbness.  10-point ROS otherwise negative.  ____________________________________________   PHYSICAL EXAM:  VITAL SIGNS: ED Triage  Vitals  Enc Vitals Group     BP 01/03/16 1202 162/100 mmHg     Pulse Rate 01/03/16 1202 106     Resp 01/03/16 1202 24     Temp 01/03/16 1202 101.2 F (38.4 C)     Temp Source 01/03/16 1202 Oral     SpO2 01/03/16 1202 95 %     Weight 01/03/16 1202 225 lb (102.059 kg)     Height 01/03/16 1202 6\' 1"  (1.854 m)     Head Cir --      Peak Flow --      Pain Score 01/03/16 1208 10     Pain Loc --      Pain Edu? --      Excl. in [place]? --     Constitutional: Alert and oriented.  Diaphoretic and ill-appearing. Eyes: Conjunctivae are normal. PERRL. EOMI. Head: Atraumatic. Nose: No congestion/rhinnorhea. Mouth/Throat: Mucous membranes are moist.  Oropharynx non-erythematous. Neck: No stridor.   Cardiovascular: Normal rate, regular rhythm. Grossly normal heart sounds.  Good peripheral circulation. Respiratory: Tachypneic with fascicular breath sounds to the right field. Tenderness to palpation to the right chest wall Gastrointestinal: Soft and nontender. No distention.  Musculoskeletal: No lower extremity tenderness nor edema.  No joint effusions. Neurologic:  Normal speech and language. No gross focal neurologic deficits are appreciated.  Skin:  Skin is warm, dry and intact. No rash noted. Psychiatric: Mood and affect are normal. Speech and behavior are normal.  ____________________________________________   LABS (all labs ordered are listed, but only abnormal results are displayed)  Labs Reviewed  BASIC METABOLIC PANEL - Abnormal; Notable for the following:    Sodium 132 (*)    Potassium 3.4 (*)    Chloride 100 (*)    CO2 21 (*)    Glucose, Bld 115 (*)    Calcium 8.0 (*)    All other components within normal limits  CBC - Abnormal; Notable for the following:    WBC 28.2 (*)    RDW 14.9 (*)    All other components within normal limits  TROPONIN I - Abnormal; Notable for the following:    Troponin I 0.22 (*)    All other components within normal limits  CULTURE, BLOOD (ROUTINE X  2)  CULTURE, BLOOD (ROUTINE X 2)  URINE CULTURE  LACTIC ACID, PLASMA  LACTIC ACID, PLASMA  URINALYSIS COMPLETEWITH MICROSCOPIC ([facility] ONLY)   ____________________________________________  EKG  ED ECG REPORT I, Doran Stabler, the attending physician, personally viewed and interpreted this ECG.   Date: 01/03/2016  EKG Time: 1212  Rate: 108  Rhythm:  sinus tachycardia  Axis: Normal axis  Intervals:none  ST&T Change: No ST segment elevation or depression. No abnormal T-wave inversion.  ____________________________________________  RADIOLOGY  DG Chest 2 View (Final result) Result time: 01/03/16 12:52:45   Final result by Rad Results In Interface (01/03/16 12:52:45)   Narrative:   CLINICAL DATA: Patient states that he has had worsening cough over past 2 weeks which has worsened today. Patient now has right side chest pain, SOB, diarrhea, and nausea.  EXAM: CHEST 2 VIEW  COMPARISON: None.  FINDINGS: There is right upper lobe consolidation. There is bilateral chronic interstitial lung disease. There is no pleural effusion or pneumothorax. The heart and mediastinal contours are unremarkable.  The osseous structures are unremarkable.  IMPRESSION: Right upper lobe pneumonia. Followup PA and lateral chest X-ray is recommended in 3-4 weeks following trial of antibiotic therapy to ensure resolution and exclude underlying malignancy.   Electronically Signed By: Kathreen Devoid On: 01/03/2016 12:52    ____________________________________________   PROCEDURES  ____________________________________________   INITIAL IMPRESSION / ASSESSMENT AND PLAN / ED COURSE  Pertinent labs & imaging results that were available during my care of the patient were reviewed by me and considered in my medical decision making (see chart for details).  ----------------------------------------- 2:30 PM on 01/03/2016 -----------------------------------------  Patient has  been signed out to Dr. Posey Pronto of the medicine service. Sepsis alert called. We'll admit to the hospital. ____________________________________________   FINAL CLINICAL IMPRESSION(S) / ED DIAGNOSES  CAP    NEW MEDICATIONS STARTED DURING THIS VISIT:  New Prescriptions   No medications on file     Note:  This document was prepared using Dragon voice recognition software and may include unintentional dictation errors.    Orbie Pyo, MD 01/03/16 [phone]

## 2016-01-03 NOTE — Progress Notes (Signed)
Pt requesting cough medicine.  Dr. Lavetta Nielsen notified, new orders received

## 2016-01-03 NOTE — ED Notes (Signed)
Pt with right rib pain , sharp and shooting on coughing x2 days , cough x2 weeks.  Productive cough (yellow)

## 2016-01-03 NOTE — Progress Notes (Signed)
Lactic Acid 3.6 Dr. Lavetta Nielsen notified no orders received

## 2016-01-04 ENCOUNTER — Inpatient Hospital Stay: Payer: Commercial Managed Care - HMO

## 2016-01-04 ENCOUNTER — Inpatient Hospital Stay (HOSPITAL_COMMUNITY)
Admit: 2016-01-04 | Discharge: 2016-01-04 | Disposition: A | Discharge status: Home / Self Care | Payer: Commercial Managed Care - HMO | Attending: Physician Assistant | Admitting: Physician Assistant

## 2016-01-04 DIAGNOSIS — R140 Abdominal distension (gaseous): Secondary | ICD-10-CM

## 2016-01-04 DIAGNOSIS — I5042 Chronic combined systolic (congestive) and diastolic (congestive) heart failure: Secondary | ICD-10-CM | POA: Insufficient documentation

## 2016-01-04 DIAGNOSIS — I959 Hypotension, unspecified: Secondary | ICD-10-CM | POA: Insufficient documentation

## 2016-01-04 DIAGNOSIS — R0600 Dyspnea, unspecified: Secondary | ICD-10-CM

## 2016-01-04 DIAGNOSIS — R7989 Other specified abnormal findings of blood chemistry: Secondary | ICD-10-CM | POA: Insufficient documentation

## 2016-01-04 DIAGNOSIS — J189 Pneumonia, unspecified organism: Secondary | ICD-10-CM | POA: Insufficient documentation

## 2016-01-04 DIAGNOSIS — I5032 Chronic diastolic (congestive) heart failure: Secondary | ICD-10-CM

## 2016-01-04 DIAGNOSIS — R778 Other specified abnormalities of plasma proteins: Secondary | ICD-10-CM | POA: Insufficient documentation

## 2016-01-04 DIAGNOSIS — I5022 Chronic systolic (congestive) heart failure: Secondary | ICD-10-CM

## 2016-01-04 DIAGNOSIS — R17 Unspecified jaundice: Secondary | ICD-10-CM

## 2016-01-04 DIAGNOSIS — K760 Fatty (change of) liver, not elsewhere classified: Secondary | ICD-10-CM

## 2016-01-04 LAB — TROPONIN I
Troponin I: 1.05 ng/mL — ABNORMAL HIGH (ref <0.031)
Troponin I: 0.13 ng/mL — ABNORMAL HIGH (ref <0.031)
Troponin I: 0.22 ng/mL — ABNORMAL HIGH (ref <0.031)
Troponin I: 0.53 ng/mL — ABNORMAL HIGH (ref <0.031)

## 2016-01-04 LAB — BLOOD CULTURE ID PANEL (REFLEXED)
Acinetobacter baumannii: NOT DETECTED
Candida albicans: NOT DETECTED
Candida glabrata: NOT DETECTED
Candida krusei: NOT DETECTED
Candida parapsilosis: NOT DETECTED
Candida tropicalis: NOT DETECTED
Carbapenem resistance: NOT DETECTED
Enterobacter cloacae complex: NOT DETECTED
Enterobacteriaceae species: NOT DETECTED
Enterococcus species: NOT DETECTED
Escherichia coli: NOT DETECTED
Haemophilus influenzae: NOT DETECTED
Klebsiella oxytoca: NOT DETECTED
Klebsiella pneumoniae: NOT DETECTED
Listeria monocytogenes: NOT DETECTED
Methicillin resistance: NOT DETECTED
Neisseria meningitidis: DETECTED — AB
Proteus species: NOT DETECTED
Pseudomonas aeruginosa: NOT DETECTED
Serratia marcescens: NOT DETECTED
Staphylococcus aureus (BCID): NOT DETECTED
Staphylococcus species: NOT DETECTED
Streptococcus agalactiae: NOT DETECTED
Streptococcus pneumoniae: NOT DETECTED
Streptococcus pyogenes: NOT DETECTED
Streptococcus species: NOT DETECTED
Vancomycin resistance: NOT DETECTED

## 2016-01-04 LAB — COMPREHENSIVE METABOLIC PANEL
ALT: 96 U/L — ABNORMAL HIGH (ref 17–63)
AST: 131 U/L — ABNORMAL HIGH (ref 15–41)
Albumin: 2.7 g/dL — ABNORMAL LOW (ref 3.5–5.0)
Alkaline Phosphatase: 56 U/L (ref 38–126)
Anion gap: 8 (ref 5–15)
BUN: 46 mg/dL — ABNORMAL HIGH (ref 6–20)
CO2: 23 mmol/L (ref 22–32)
Calcium: 7.4 mg/dL — ABNORMAL LOW (ref 8.9–10.3)
Chloride: 101 mmol/L (ref 101–111)
Creatinine, Ser: 2.41 mg/dL — ABNORMAL HIGH (ref 0.61–1.24)
GFR calc Af Amer: 35 mL/min — ABNORMAL LOW (ref >60)
GFR calc non Af Amer: 31 mL/min — ABNORMAL LOW (ref >60)
Glucose, Bld: 114 mg/dL — ABNORMAL HIGH (ref 65–99)
Potassium: 4.1 mmol/L (ref 3.5–5.1)
Sodium: 132 mmol/L — ABNORMAL LOW (ref 135–145)
Total Bilirubin: 4.2 mg/dL — ABNORMAL HIGH (ref 0.3–1.2)
Total Protein: 6 g/dL — ABNORMAL LOW (ref 6.5–8.1)

## 2016-01-04 LAB — LIPID PANEL
Cholesterol: 74 mg/dL (ref 0–200)
HDL: 14 mg/dL — ABNORMAL LOW (ref >40)
LDL Cholesterol: 46 mg/dL (ref 0–99)
Total CHOL/HDL Ratio: 5.3 RATIO
Triglycerides: 71 mg/dL (ref <150)
VLDL: 14 mg/dL (ref 0–40)

## 2016-01-04 LAB — BASIC METABOLIC PANEL
Anion gap: 9 (ref 5–15)
BUN: 28 mg/dL — ABNORMAL HIGH (ref 6–20)
CO2: 24 mmol/L (ref 22–32)
Calcium: 7.2 mg/dL — ABNORMAL LOW (ref 8.9–10.3)
Chloride: 101 mmol/L (ref 101–111)
Creatinine, Ser: 1.19 mg/dL (ref 0.61–1.24)
GFR calc Af Amer: >60 mL/min (ref >60)
GFR calc non Af Amer: >60 mL/min (ref >60)
Glucose, Bld: 127 mg/dL — ABNORMAL HIGH (ref 65–99)
Potassium: 3.7 mmol/L (ref 3.5–5.1)
Sodium: 134 mmol/L — ABNORMAL LOW (ref 135–145)

## 2016-01-04 LAB — CBC
HCT: 42 % (ref 40.0–52.0)
Hemoglobin: 13.7 g/dL (ref 13.0–18.0)
MCH: 27.2 pg (ref 26.0–34.0)
MCHC: 32.7 g/dL (ref 32.0–36.0)
MCV: 83.2 fL (ref 80.0–100.0)
Platelets: 100 10*3/uL — ABNORMAL LOW (ref 150–440)
RBC: 5.05 MIL/uL (ref 4.40–5.90)
RDW: 15.2 % — ABNORMAL HIGH (ref 11.5–14.5)
WBC: 18 10*3/uL — ABNORMAL HIGH (ref 3.8–10.6)

## 2016-01-04 LAB — URINE CULTURE: Culture: NO GROWTH

## 2016-01-04 LAB — GLUCOSE, CAPILLARY
Glucose-Capillary: 213 mg/dL — ABNORMAL HIGH (ref 65–99)
Glucose-Capillary: 140 mg/dL — ABNORMAL HIGH (ref 65–99)

## 2016-01-04 LAB — ECHOCARDIOGRAM COMPLETE
Height: 73 in
Weight: 3856 oz

## 2016-01-04 LAB — PROCALCITONIN: Procalcitonin: 92.35 ng/mL

## 2016-01-04 LAB — MRSA PCR SCREENING: MRSA by PCR: NEGATIVE

## 2016-01-04 LAB — BILIRUBIN, DIRECT: Bilirubin, Direct: 1.9 mg/dL — ABNORMAL HIGH (ref 0.1–0.5)

## 2016-01-04 LAB — HEMOGLOBIN A1C: Hgb A1c MFr Bld: 5.6 % (ref 4.0–6.0)

## 2016-01-04 MED ORDER — VANCOMYCIN HCL IN DEXTROSE 1-5 GM/200ML-% IV SOLN
1000.0000 mg | Freq: Once | INTRAVENOUS | Status: AC
  Administered 2016-01-04: 1000 mg via INTRAVENOUS
  Filled 2016-01-04: qty 200

## 2016-01-04 MED ORDER — SODIUM CHLORIDE 0.9 % IV BOLUS (SEPSIS)
500.0000 mL | Freq: Once | INTRAVENOUS | Status: AC
  Administered 2016-01-04: 500 mL via INTRAVENOUS

## 2016-01-04 MED ORDER — IPRATROPIUM-ALBUTEROL 0.5-2.5 (3) MG/3ML IN SOLN
3.0000 mL | Freq: Four times a day (QID) | RESPIRATORY_TRACT | Status: DC
  Administered 2016-01-04 – 2016-01-07 (×9): 3 mL via RESPIRATORY_TRACT
  Filled 2016-01-04 (×11): qty 3

## 2016-01-04 MED ORDER — NICOTINE 21 MG/24HR TD PT24
21.0000 mg | MEDICATED_PATCH | Freq: Every day | TRANSDERMAL | Status: DC
  Filled 2016-01-04 (×4): qty 1

## 2016-01-04 MED ORDER — ENOXAPARIN SODIUM 120 MG/0.8ML ~~LOC~~ SOLN
110.0000 mg | Freq: Two times a day (BID) | SUBCUTANEOUS | Status: DC
  Filled 2016-01-04 (×3): qty 0.8

## 2016-01-04 MED ORDER — DEXTROSE 5 % IV SOLN
2.0000 g | Freq: Two times a day (BID) | INTRAVENOUS | Status: DC
  Administered 2016-01-04 – 2016-01-05 (×2): 2 g via INTRAVENOUS
  Filled 2016-01-04 (×3): qty 2

## 2016-01-04 MED ORDER — HYDROMORPHONE HCL 1 MG/ML IJ SOLN
1.0000 mg | INTRAMUSCULAR | Status: DC | PRN
  Administered 2016-01-04 – 2016-01-08 (×4): 1 mg via INTRAVENOUS
  Filled 2016-01-04 (×4): qty 1

## 2016-01-04 MED ORDER — VANCOMYCIN HCL 10 G IV SOLR
1250.0000 mg | Freq: Three times a day (TID) | INTRAVENOUS | Status: DC
  Administered 2016-01-05 (×2): 1250 mg via INTRAVENOUS
  Filled 2016-01-04 (×5): qty 1250

## 2016-01-04 MED ORDER — SODIUM CHLORIDE 0.9 % IV SOLN
INTRAVENOUS | Status: DC
  Administered 2016-01-04: 13:00:00 via INTRAVENOUS

## 2016-01-04 MED ORDER — PIPERACILLIN-TAZOBACTAM 3.375 G IVPB: 3.3750 g | Freq: Four times a day (QID) | INTRAVENOUS | Status: DC

## 2016-01-04 MED ORDER — IPRATROPIUM-ALBUTEROL 0.5-2.5 (3) MG/3ML IN SOLN
3.0000 mL | RESPIRATORY_TRACT | Status: DC | PRN
  Administered 2016-01-08: 3 mL via RESPIRATORY_TRACT
  Filled 2016-01-04: qty 3

## 2016-01-04 MED ORDER — BUDESONIDE 0.25 MG/2ML IN SUSP
0.2500 mg | Freq: Two times a day (BID) | RESPIRATORY_TRACT | Status: DC
  Administered 2016-01-04 – 2016-01-08 (×9): 0.25 mg via RESPIRATORY_TRACT
  Filled 2016-01-04 (×10): qty 2

## 2016-01-04 MED ORDER — BUDESONIDE 0.25 MG/2ML IN SUSP: 0.2500 mg | Freq: Two times a day (BID) | RESPIRATORY_TRACT | Status: DC

## 2016-01-04 MED ORDER — PIPERACILLIN-TAZOBACTAM 3.375 G IVPB
3.3750 g | Freq: Three times a day (TID) | INTRAVENOUS | Status: DC
  Administered 2016-01-04 – 2016-01-05 (×2): 3.375 g via INTRAVENOUS
  Filled 2016-01-04 (×6): qty 50

## 2016-01-04 MED ORDER — MORPHINE SULFATE (PF) 2 MG/ML IV SOLN
1.0000 mg | Freq: Once | INTRAVENOUS | Status: AC
  Administered 2016-01-04: 1 mg via INTRAVENOUS

## 2016-01-04 MED ORDER — SODIUM CHLORIDE 0.9 % IV BOLUS (SEPSIS)
500.0000 mL | Freq: Once | INTRAVENOUS | Status: AC
  Administered 2016-01-05: 500 mL via INTRAVENOUS

## 2016-01-04 MED ORDER — CYCLOBENZAPRINE HCL 10 MG PO TABS
5.0000 mg | ORAL_TABLET | Freq: Three times a day (TID) | ORAL | Status: DC | PRN
  Administered 2016-01-05: 5 mg via ORAL
  Filled 2016-01-04: qty 1

## 2016-01-04 MED ORDER — ENOXAPARIN SODIUM 40 MG/0.4ML ~~LOC~~ SOLN: 40.0000 mg | SUBCUTANEOUS | Status: DC

## 2016-01-04 NOTE — Procedures (Signed)
  Procedure Note: [facility] Venous Catheter Placement  [name] , [id number] , [id number]-[id number]  Indications: Hemodynamic monitoring / Intravenous access  Benefits, risks (including bleeding, infection,  Injury, etc.), and alternatives explained to Patient who voiced understanding.  Questions were sought and answered.   Patient agreed to proceed with the procedure.  Consent is signed and on chart. A time-out was completed verifying correct patient, procedure and site.  A 3 lumen catheter available at the time of procedure.  The patient was placed in a dependent position appropriate for central line placement based on the vein to be cannulated.   The patient's RIGHT Internal Jugular Vein was prepped and draped in a sterile fashion.  1% Lidocaine WAS used to anesthetize the surrounding skin area.   A 3 lumen catheter was introduced into the RIGHT Internal Jugular Vein using Seldinger technique, visualized under ultrasound.  The catheter was threaded smoothly over the guide wire and appropriate blood return was obtained.  Each lumen of the catheter was evacuated of air and flushed with sterile saline.  The catheter was then sutured in place to the skin and a sterile dressing applied.  Perfusion to the extremity distal to the point of catheter insertion was checked and found to be adequate.    Chest X-ray was ordered for confirmation of placement.  The patient tolerated the procedure well and there were no complications. CVL Placement performed by NP Bincy Varughese under direct supervision of Dr. Charissa Bash, MD [facility] Pager [phone] (please enter 7-digits) On Call Pager - [phone] (please enter 7-digits)

## 2016-01-04 NOTE — Consult Note (Signed)
Pharmacy Antibiotic Note  Gabriel [name]. is a 45 y.o. male admitted on 01/03/2016 with sepsis.  Pharmacy has been consulted for Vancomycin dosing.  Of note, MRSA PCR pending. May need to adjust therapy based on results.  Plan: Vancomycin 1250mg  IV every 8 hours.  Goal trough 15-20 mcg/mL. Will use stacked dosing of 6 hours. Vancomycin trough scheduled for 1500 on 5/19, prior to 4th dose.    Ke= 0.088 hr-1 T1/2= 8.89 hr Vd= 76.5 L Extrapolated Cpk= 28.82, Ctr= 15.57 (went on lower end due to concerns for worsening renal function secondary to sepsis and risk of accumulation).  Height: 6\' 1"  (185.4 cm) Weight: 241 lb (109.317 kg) IBW/kg (Calculated) : 79.9  Temp (24hrs), Avg:97.8 F (36.6 C), Min:97.8 F (36.6 C), Max:97.8 F (36.6 C)   Recent Labs Lab 01/03/16 1218 01/03/16 1345 01/03/16 1621 01/04/16 0418  WBC 28.2*  --   --  18.0*  CREATININE 0.94  --   --  1.19  LATICACIDVEN  --  2.0 3.6*  --     Estimated Creatinine Clearance: 100.6 mL/min (by C-G formula based on Cr of 1.19).    Allergies  Allergen Reactions  . Prednisone Other (See Comments)    Reaction: Hallucinations     Antimicrobials this admission: Vancomycin 5/18 >> Zosyn 5/18 >> Azithromycin 5/17 >> 5/18 CTX 5/17 >> 5/18  Microbiology results: 5/17 BCx: NGTD 5/17 UCx: NGTD  5/18 MRSA PCR: pending  Thank you for allowing pharmacy to be a part of this patient's care.  Roe Coombs, PharmD Pharmacy Resident 01/04/2016

## 2016-01-04 NOTE — Progress Notes (Signed)
[facility] Progress Note Patient Name: Avner Marchi. DOB: 04-11-1971 MRN: [id number]   Date of Service  01/04/2016  HPI/Events of Note  Ongoing hypotension in the setting of sepsis and CHF.  CXR shows right lobe PNA but no overt CHF.  Sats of 97% in no resp distress.  Current systolic BP of 77.  eICU Interventions  Plan: 500 cc NS bolus for BP support Check CVP     Intervention Category Intermediate Interventions: Hypotension - evaluation and management  Zamaya Rapaport 01/04/2016, 11:32 PM

## 2016-01-04 NOTE — Consult Note (Signed)
PULMONARY / CRITICAL CARE MEDICINE   Name: Gabriel [name]. MRN: [id number] DOB: [birth year]    ADMISSION DATE:  01/03/2016 CONSULTATION DATE:  01/04/16  REFERRING MD :  Dr. Benjie Karvonen  CHIEF COMPLAINT:     SOB, cough,   HISTORY OF PRESENT ILLNESS    Patient is a 45 year old male past medical history of cardiomyopathy, chronic systolic CHF, COPD, ongoing tobacco abuse, 2 pack per day, presented with cough and shortness of breath for the past 2 weeks. Chest x-ray shows that he has a right upper lobe pneumonia. He was started on Rocephin and azithromycin, however subsequently developed hypotension and lethargy, had a possible presyncopal episode going to the bathroom this morning. Patient was complaining of chest pain with troponins trending up, was started on.enoxapain. On 5/18 patient was found to be hypotensive due to vasovagal maneuver. His systolic blood pressure was in 80s. Patient received 500 mL of normal saline bolus. His blood pressure improved. EKG was performed which showed no acute changes. Patient received another 500 mL of bolus. His troponins-0.13. Patient continues to be hypotensive and therefore require monitoring in ICU. 5/18 [facility] team was consulted for further management and care given his hypotension in the setting of sepsis secondary to right upper lobe pneumonia. he states that he's been smoking a pack a day for the past 2-3 months, previously smoked 2 packs per day for the past 33 years. He also stated that over the past couple of weeks his urine has change in color, normal brownish, he's noticed his eyes started to become slightly yellow also, is also been associated with mild abdominal swelling and distention.  SIGNIFICANT EVENTS      PAST MEDICAL HISTORY    :  Past Medical History  Diagnosis Date  . Hypertension     Resolved since weight loss  . Obesity (BMI 30.0-34.9)   . Psoriasis   . Diabetes mellitus without complication ([place])     Pre diabetic  . CHF  (congestive heart failure) ([place])   . COPD (chronic obstructive pulmonary disease) [facility])    Past Surgical History  Procedure Laterality Date  . Amputation    . Finger amputation      Traumatic   Prior to Admission medications   Medication Sig Start Date End Date Taking? Authorizing Provider  albuterol (PROVENTIL HFA;VENTOLIN HFA) 108 (90 Base) MCG/ACT inhaler Inhale 2 puffs into the lungs every 6 (six) hours as needed for wheezing or shortness of breath. 12/14/15   Carrie Mew, MD  aspirin EC 81 MG tablet Take 1 tablet (81 mg total) by mouth daily. 12/14/15 12/13/16  Carrie Mew, MD  carvedilol (COREG) 12.5 MG tablet Take 1 tablet (12.5 mg total) by mouth 2 (two) times daily with a meal. 12/14/15   Carrie Mew, MD  furosemide (LASIX) 40 MG tablet Take 1 tablet (40 mg total) by mouth daily. 12/14/15 12/13/16  Carrie Mew, MD  lisinopril (PRINIVIL,ZESTRIL) 20 MG tablet Take 2 tablets (40 mg total) by mouth daily. 12/14/15 12/13/16  Carrie Mew, MD  potassium chloride (K-DUR) 10 MEQ tablet Take 2 tablets (20 mEq total) by mouth daily. 11/24/15   Orbie Pyo, MD  simvastatin (ZOCOR) 40 MG tablet Take 1 tablet (40 mg total) by mouth every evening. 11/24/15 11/23/16  Orbie Pyo, MD   Allergies  Allergen Reactions  . Prednisone Other (See Comments)    Reaction: Hallucinations      FAMILY HISTORY   Family History  Problem Relation Age of Onset  .  Diabetes Mellitus II Mother   . Hypertension Father   . Cancer Paternal Aunt   . Cancer Maternal Grandfather       SOCIAL HISTORY    reports that he has been smoking Cigarettes.  He has a 8.25 pack-year smoking history. He has never used smokeless tobacco. He reports that he drinks about 1.8 oz of alcohol per week. He reports that he does not use illicit drugs.  Review of Systems  Constitutional: Positive for fever and chills.  Eyes: Negative for blurred vision.  Respiratory: Positive for cough, sputum  production, shortness of breath and wheezing.   Gastrointestinal: Negative for nausea, vomiting and abdominal pain.  Genitourinary: Negative for dysuria, hematuria and flank pain.  Musculoskeletal: Positive for myalgias.  Skin: Negative for itching and rash.  Neurological: Negative for headaches.  Endo/Heme/Allergies: Does not bruise/bleed easily.  Psychiatric/Behavioral: Negative for depression.      VITAL SIGNS    Temp:  [97.8 F (36.6 C)] 97.8 F (36.6 C) (05/18 0520) Pulse Rate:  [78-105] 78 (05/18 1026) Resp:  [22] 22 (05/18 0520) BP: (74-143)/(26-121) 83/50 mmHg (05/18 1500) SpO2:  [92 %-100 %] 100 % (05/18 1400) HEMODYNAMICS:   VENTILATOR SETTINGS:   INTAKE / OUTPUT:  Intake/Output Summary (Last 24 hours) at 01/04/16 1625 Last data filed at 01/04/16 0900  Gross per 24 hour  Intake   1360 ml  Output    300 ml  Net   1060 ml       PHYSICAL EXAM   Physical Exam  Constitutional: He appears well-developed and well-nourished.  HENT:  Head: Normocephalic and atraumatic.  Eyes: Conjunctivae and EOM are normal. Pupils are equal, round, and reactive to light. Right eye exhibits no discharge. Left eye exhibits no discharge. Scleral icterus is present.  Mild scleral icterus  Neck: Normal range of motion. Neck supple. No thyromegaly present.  Cardiovascular: Normal rate and intact distal pulses.   No murmur heard. Pulmonary/Chest: Effort normal and breath sounds normal. He has no wheezes. He has no rales.  Tachypnea, respiratory rate in the upper 20s  Abdominal: Soft. He exhibits distension. He exhibits no mass. There is no tenderness. There is no rebound and no guarding.  Musculoskeletal: Normal range of motion. He exhibits no edema.  Neurological: He is alert.  Skin: Skin is warm and dry.  Nursing note and vitals reviewed.      LABS   LABS:  CBC  Recent Labs Lab 01/03/16 1218 01/04/16 0418  WBC 28.2* 18.0*  HGB 15.1 13.7  HCT 44.9 42.0  PLT 163  100*   Coag's No results for input(s): APTT, INR in the last 168 hours. BMET  Recent Labs Lab 01/03/16 1218 01/04/16 0418  NA 132* 134*  K 3.4* 3.7  CL 100* 101  CO2 21* 24  BUN 17 28*  CREATININE 0.94 1.19  GLUCOSE 115* 127*   Electrolytes  Recent Labs Lab 01/03/16 1218 01/04/16 0418  CALCIUM 8.0* 7.2*   Sepsis Markers  Recent Labs Lab 01/03/16 1345 01/03/16 1621  LATICACIDVEN 2.0 3.6*   ABG No results for input(s): PHART, PCO2ART, PO2ART in the last 168 hours. Liver Enzymes No results for input(s): AST, ALT, ALKPHOS, BILITOT, ALBUMIN in the last 168 hours. Cardiac Enzymes  Recent Labs Lab 01/04/16 0418 01/04/16 1009 01/04/16 1401  TROPONINI 1.05* 0.13* 0.22*   Glucose  Recent Labs Lab 01/04/16 1028 01/04/16 1614  GLUCAP 213* 140*     Recent Results (from the past 240 hour(s))  Urine culture  Status: None   Collection Time: 01/03/16  1:32 PM  Result Value Ref Range Status   Specimen Description URINE, RANDOM  Final   Special Requests NONE  Final   Culture NO GROWTH Performed at [facility]   Final   Report Status 01/04/2016 FINAL  Final  Blood Culture (routine x 2)     Status: None (Preliminary result)   Collection Time: 01/03/16  1:45 PM  Result Value Ref Range Status   Specimen Description BLOOD RIGHT ARM  Final   Special Requests BOTTLES DRAWN AEROBIC AND ANAEROBIC 5CCAERO,4CCANA  Final   Culture NO GROWTH 1 DAY  Final   Report Status PENDING  Incomplete  Blood Culture (routine x 2)     Status: None (Preliminary result)   Collection Time: 01/03/16  1:45 PM  Result Value Ref Range Status   Specimen Description BLOOD LEFT ASSIST CONTROL  Final   Special Requests BOTTLES DRAWN AEROBIC AND ANAEROBIC [place]  Final   Culture NO GROWTH < 24 HOURS  Final   Report Status PENDING  Incomplete     Current facility-administered medications:  .  0.9 %  sodium chloride infusion, , Intravenous, Continuous, Lytle Butte, MD,  Stopped at 01/04/16 1050 .  0.9 %  sodium chloride infusion, , Intravenous, Continuous, Sital Mody, MD, Last Rate: 75 mL/hr at 01/04/16 1407 .  acetaminophen (TYLENOL) tablet 650 mg, 650 mg, Oral, Q6H PRN **OR** acetaminophen (TYLENOL) suppository 650 mg, 650 mg, Rectal, Q6H PRN, Lytle Butte, MD .  albuterol (PROVENTIL) (2.5 MG/3ML) 0.083% nebulizer solution 2.5 mg, 2.5 mg, Inhalation, Q6H PRN, Lytle Butte, MD .  aspirin EC tablet 81 mg, 81 mg, Oral, Daily, Lytle Butte, MD, 81 mg at 01/04/16 0858 .  azithromycin (ZITHROMAX) 500 mg in dextrose 5 % 250 mL IVPB, 500 mg, Intravenous, Q24H, Lytle Butte, MD, 500 mg at 01/04/16 0901 .  cefTRIAXone (ROCEPHIN) 1 g in dextrose 5 % 50 mL IVPB, 1 g, Intravenous, Q24H, Lytle Butte, MD, 1 g at 01/04/16 0900 .  cyclobenzaprine (FLEXERIL) tablet 5 mg, 5 mg, Oral, TID PRN, Bettey Costa, MD .  enoxaparin (LOVENOX) injection 40 mg, 40 mg, Subcutaneous, Q24H, Sital Mody, MD .  guaiFENesin-codeine 100-10 MG/5ML solution 10 mL, 10 mL, Oral, Q4H PRN, Lytle Butte, MD, 10 mL at 01/03/16 1654 .  ipratropium-albuterol (DUONEB) 0.5-2.5 (3) MG/3ML nebulizer solution 3 mL, 3 mL, Nebulization, Q4H, Lytle Butte, MD, 3 mL at 01/04/16 1522 .  morphine 2 MG/ML injection 1 mg, 1 mg, Intravenous, Once, Fredrico Beedle, MD .  morphine 2 MG/ML injection 2 mg, 2 mg, Intravenous, Q4H PRN, Lytle Butte, MD, 2 mg at 01/04/16 0857 .  nicotine (NICODERM CQ - dosed in mg/24 hours) patch 21 mg, 21 mg, Transdermal, Daily, Bettey Costa, MD, 21 mg at 01/04/16 1230 .  ondansetron (ZOFRAN) tablet 4 mg, 4 mg, Oral, Q6H PRN **OR** ondansetron (ZOFRAN) injection 4 mg, 4 mg, Intravenous, Q6H PRN, Lytle Butte, MD, 4 mg at 01/04/16 1046 .  oxyCODONE (Oxy IR/ROXICODONE) immediate release tablet 5 mg, 5 mg, Oral, Q4H PRN, Lytle Butte, MD, 5 mg at 01/04/16 0740 .  pneumococcal 23 valent vaccine (PNU-IMMUNE) injection 0.5 mL, 0.5 mL, Intramuscular, Tomorrow-1000, Lytle Butte, MD .  simvastatin  (ZOCOR) tablet 40 mg, 40 mg, Oral, QPM, Lytle Butte, MD, 40 mg at 01/03/16 1800 .  sodium chloride flush (NS) 0.9 % injection 3 mL, 3 mL, Intravenous, Q12H, Shanon Brow  K Hower, MD, 3 mL at 01/04/16 0907  IMAGING    No results found.    Indwelling Urinary Catheter continued, requirement due to   Reason to continue Indwelling Urinary Catheter for strict Intake/Output monitoring for hemodynamic instability   Central Line continued, requirement due to   Reason to continue [employer] Monitoring of central venous pressure or other hemodynamic parameters   Ventilator continued, requirement due to, resp failure    Ventilator Sedation RASS 0 to -2   Cultures: BCx2 5/17> UC 5/17> Sputum  Antibiotics: Rochephin 5/17>5/18 Azithro 5/17>5/18 Vanc 5/18>> Zosyn 5/18>>  Lines: RIJ CVL 5/18>>  ASSESSMENT/PLAN  45 year old male past medical history of cardiomyopathy with preserved ejection fraction of 45%, tobacco abuse, elevated troponin, now with sepsis and right upper lobe pneumonia  PULMONARY Sepsis Right upper lobe pneumonia Hypertension Tachypnea COPD Tobacco abuse  P:   -Broad antibiotics, changed to vancomycin and Zosyn -Trend pro-calcitonin -Monitor blood pressure -Follow-up lactic acid -Chest x-ray in the morning -Labs in the morning -Mentating oxygen saturation greater than 80% -MAP>25mm, vasopressors if needed -Schedule nebulizers -pulmicort BID x 3 days  CARDIOVASCULAR CVL RIJ  Nonischemic cardiopathy Congestive heart failure, preserved ejection fraction 45% Elevated troponin Acute on Chronic hypotension  P:  -Blood pressure normally in 90s to low 100s -Now with sepsis -Vasopressors if needed -Hold current blood pressure medications -Hemodynamic monitoring -Troponin trending down now, was likely demand ischemia, continue to monitor -Gentle IV fluids  RENAL Acute kidney injury Dehydration Dark Urin  P:   - avoid nephro toxic drugs - gentle  IVFs - monitor CMP, check bilirubin levels   GASTROINTESTINAL Scleral Icterus Abdominal distention Hx of Fatty Liver P:   - check LFTs and bilirubin - check RUQ U\S  HEMATOLOGIC leukocytosis P:  -F\U CBC  INFECTIOUS RUL PNA (CAP) Sepsis P:   - cont with abx as stated above - trend PCT  ENDOCRINE - ICU hypo/hyperglycemic protocol   I have personally obtained a history, examined the patient, evaluated laboratory and imaging results, formulated the assessment and plan and placed orders.  Critical Care Time devoted to patient care services described in this note is 45 minutes.   Vilinda Boehringer, MD [facility] Pager [phone] (please enter 7-digits) On Call Pager [phone] (please enter 7-digits)     01/04/2016, 4:25 PM  Note: This note was prepared with Dragon dictation along with smaller phrase technology. Any transcriptional errors that result from this process are unintentional.

## 2016-01-04 NOTE — Progress Notes (Signed)
PHARMACY - PHYSICIAN COMMUNICATION CRITICAL VALUE ALERT - BLOOD CULTURE IDENTIFICATION (BCID)  Results for orders placed or performed during the hospital encounter of 01/03/16  Blood Culture ID Panel (Reflexed) (Collected: 01/03/2016  1:45 PM)  Result Value Ref Range   Enterococcus species NOT DETECTED NOT DETECTED   Vancomycin resistance NOT DETECTED NOT DETECTED   Listeria monocytogenes NOT DETECTED NOT DETECTED   Staphylococcus species NOT DETECTED NOT DETECTED   Staphylococcus aureus NOT DETECTED NOT DETECTED   Methicillin resistance NOT DETECTED NOT DETECTED   Streptococcus species NOT DETECTED NOT DETECTED   Streptococcus agalactiae NOT DETECTED NOT DETECTED   Streptococcus pneumoniae NOT DETECTED NOT DETECTED   Streptococcus pyogenes NOT DETECTED NOT DETECTED   Acinetobacter baumannii NOT DETECTED NOT DETECTED   Enterobacteriaceae species NOT DETECTED NOT DETECTED   Enterobacter cloacae complex NOT DETECTED NOT DETECTED   Escherichia coli NOT DETECTED NOT DETECTED   Klebsiella oxytoca NOT DETECTED NOT DETECTED   Klebsiella pneumoniae NOT DETECTED NOT DETECTED   Proteus species NOT DETECTED NOT DETECTED   Serratia marcescens NOT DETECTED NOT DETECTED   Carbapenem resistance NOT DETECTED NOT DETECTED   Haemophilus influenzae NOT DETECTED NOT DETECTED   Neisseria meningitidis DETECTED (A) NOT DETECTED   Pseudomonas aeruginosa NOT DETECTED NOT DETECTED   Candida albicans NOT DETECTED NOT DETECTED   Candida glabrata NOT DETECTED NOT DETECTED   Candida krusei NOT DETECTED NOT DETECTED   Candida parapsilosis NOT DETECTED NOT DETECTED   Candida tropicalis NOT DETECTED NOT DETECTED    Name of physician (or Provider) Contacted: Dr. Melvyn Novas  Changes to prescribed antibiotics required: added ceftriaxone 2g q 12 hours  Ramond Dial 01/04/2016  7:50 PM

## 2016-01-04 NOTE — Progress Notes (Signed)
*  PRELIMINARY RESULTS* Echocardiogram 2D Echocardiogram has been performed.  Gabriel [name] 01/04/2016, 1:51 PM

## 2016-01-04 NOTE — Progress Notes (Signed)
Patient is transferring to ICU 17. Updated girlfriend, Franchot Erichsen with new room # and patient condition. Report called to [facility]. Pt belongings packed and transported with him to [place].

## 2016-01-04 NOTE — Progress Notes (Signed)
BP was trending upwards to 90s/60s, however last three rechecks have been in the 70s/50s. Dr. Benjie Karvonen paged and aware, placed orders for a 2nd 500cc bolus and continuous fluids after that. Per Dr. Benjie Karvonen will reassess mid afternoon if patient needs to transfer to ICU. Patient is resting quietly in bed, continues to report dizziness and lightheadedness, but states he feels better.

## 2016-01-04 NOTE — Progress Notes (Signed)
Patients BP remains soft, 80s/60s, occasionally 90s/60s; pt still experiencing some dizziness. Patients work of breathing has increased and crackles auscultated bilaterally; SpO2 100 on 2L [place]. Dr. Benjie Karvonen paged and updated, she asked that patient be transferred to ICU/stepdown, order is already in; adjusted IVF order to 6ml/hr per MD.

## 2016-01-04 NOTE — Progress Notes (Signed)
Chaplain responded to rapid response.  Minerva Fester [phone]

## 2016-01-04 NOTE — Progress Notes (Signed)
Pt BP currently stable at 103/77. Pt pain was controlled with 1mg  Dilaudid. Pt had one episode of  Nausea controlled with zofran. Pt work of breathing has improved. Pt does not feel SOB. Pt remains on 3 L of O2 for comfort. Pt is resting comftorably. Pt is alert and oriented x 4.  Pt in stable condition at this time.  Report given to oncoming shift nurse will continue to assess.

## 2016-01-04 NOTE — Care Management Important Message (Signed)
Important Message  Patient Details  Name: Gabriel [name]. MRN: [id number] Date of Birth: 30-Nov-1970   Medicare Important Message Given:  Yes    Jakyrie Totherow A, RN 01/04/2016, 9:26 AM

## 2016-01-04 NOTE — Progress Notes (Addendum)
Voicemail left for Mariea Clonts, patients father with call-back number.   4:21 PM- Mariea Clonts called back to unit and given update and ICU room # per patient request.

## 2016-01-04 NOTE — Progress Notes (Addendum)
Rapid response called on Mr. Bennett Scrape. Patient went to the bathroom and apparently had a syncopal episode. Prior to that I had just seen him and he was complaining of back pain. Vasovagal syncope is likely diagnosis causing rapid response. She was placed in Trendelenburg position. Systolic blood pressure was in the 80s. After 500 normal saline bolus blood pressure has improved. Patient is conversive. Patient no longer is reporting dizziness or lightheadedness. EKG was performed which showed no acute changes. She is still complaining of right-sided chest pain which she came in with due to pneumonia.  CRITICAL CARE TIME #31minutes

## 2016-01-04 NOTE — Progress Notes (Signed)
CRITICAL VALUE ALERT  Critical value received:  Troponin 0.22   Date of notification:  01/04/2016  Time of notification:  3:38 PM  Critical value read back:Yes.    Nurse who received alert:  Legrand Pitts, RN  MD notified (1st page):  Christell Faith, PA  Time of first page:  N/a on the floor & updated verbally  MD notified (2nd page):  Time of second page:  Responding MD:    Time MD responded:  3:38 PM

## 2016-01-04 NOTE — Significant Event (Signed)
Rapid Response Event Note  Overview:  Rapid Response called for patient who was dizzy and hypotensive.    Initial Focused Assessment: Pt alert and oriented. Pt in trendelenburg when this RN arrived. Dr. Benjie Karvonen at bedside and had ordered fluid bolus.   Interventions: RN assessed and due to Dr. Benjie Karvonen being present nothing was needed of this RN at present.   Event Summary: Rapid Response called at 1030am        Gabriel [name] E 01/04/2016 11:00 AM

## 2016-01-04 NOTE — Progress Notes (Addendum)
Rapid Response Event Note  Overview:  Patient called RN into the room saying "I don't feel right," "I'm dizzy and feel like I'm about to throw up." RN immediately arrived in room. VS obtained and initial BP 74/26. Patient reported he had come back from bathroom to have a BM and did report straining "a little." Patient received PRN IV morphine at 9am for R sided stabbing chest/rib cage pain. 1020 troponin trending downwards to 0.13.   Rapid response team called immediately at 1023 and arrived at approximately 1028 along with Dr. Benjie Karvonen and nursing supervisor.    Initial Focused Assessment: Pt pale and diaphoretic upon RN arrival. Minimal verbal response, appears lethargic. BP 70s/40s, SpO2 WNL. With fluid resuscitation, patients color returning, BP increasing slowly.  Interventions: Patient immediately trendelenberged, 2L O2 via [place]  Dr. Benjie Karvonen ordered 500cc bolus 12-lead EKG, showed no acute changes PRN zofran given CBG checked: 213 BP's cycles <Q23min  Event Summary:  Rapid response called at 1023.      MD & RRT at bedside at  ~1028   BP trending upwards, now currently 98/69 (MAP 75). Per Dr. Benjie Karvonen, finish 500cc bolus and continue to monitor. Patient is a little more alert now, conversing with cardiologist who is currently at bedside. Patient continues to lay supine in bed, with eyes closed and still reports feeling dizzy. No complaints of chest pain/pressure/discomfort during this episode.    Alonna Buckler

## 2016-01-04 NOTE — Consult Note (Signed)
Cardiology Consultation Note  Patient ID: Gabriel Watkin., MRN: [id number], DOB/AGE: 11-03-1970 45 y.o. Admit date: 01/03/2016   Date of Consult: 01/04/2016 Primary Physician: No PCP Per Patient Primary Cardiologist: Dr. Fletcher Anon, MD (never seen in outpatient setting) Requesting Physician: Gardiner Coins, MD  Chief Complaint: Cough and SOB Reason for Consult: Elevated troponin in the setting of septic shock   HPI: 45 y.o. male with h/o presumed nonischemic cardiomyopathy by nuclear stress test in [id number], chronic systolic CHF, COPD, ongoing tobacco abuse at 2 ppd since age 59, DM2, and obesity who presented to [facility] on 01/03/16 with cough and SOB. He was found to have septic shock in the setting of right upper lobe pneumonia.   Patient was previously admitted in August 2016 and September 2016 with atypical chest pain. Echo during August admission showed mildly reduced LV systolic function with mildly elevated troponin and BNP. He had been hypertensive and unable to afford his medications at that time. He was discharged back in August 2016 on antihypertensives, but did nto take them. During his September 2016 admission for atypical chest pain and HTN he underwent nuclear stress testing that showed a small defect of mild severity in the apex location felt to be 2/2 apical thinning. EF was estimated at 30-44%. Overall, this was an intermediate risk scan. There was no evidence of significant perfusion abnormalities and he was felt to have NICM. He never followed up with cardiology besides the CHF clinic during which no changes were made to his CHF regimen. He does not know what his A1C or LDL are.   He presented to [facility] on 5/17 after developing cough and increased SOB on 5/16 with associated fevers and chills. He also noted some right-sided chest pain that was associated with deep inspiration and coughing. He otherwise has not had any chest pain. He denies any nausea, vomiting, diaphoresis, or palpitations. Because  of his cough he presented to [facility].   Upon the patient's arrival to [facility] they were found to have a WBC of 28,000, lactic acid of 2.0-->3.6, troponin as below. ECG showed NSR, 81 bpm, incomplete RBBB, poor R wave progression, no acute st/t changes, CXR showed right upper lobe consolidation c/w pneumonia. He was started on IV Rocephin and azithromycin. Rapid response was called earlier this morning for dizziness and presyncope in the setting of the patient straining to have a BM. He was given IV fluids for hydration. He currently only complains of right-sided chest pain associated with deep inspiration and cough. Otherwise, there has been no chest pain. He has been found to have septic shock in the setting of right upper lobe pneumonia. Troponin trend of 0.22-->0.19-->0.18-->1.05-->0.13 just now. WBC count of 28,000-->18,000. Febrile to a T max of 101.2 this admission. Hypotensive with BP down to the [id number] systolic this morning, currently in the mid [id number] systolic with MAP of mid 0000000 to low 70's. He is currently sleepy, laying in his bed.    Past Medical History  Diagnosis Date  . Hypertension     Resolved since weight loss  . Obesity (BMI 30.0-34.9)   . Psoriasis   . Diabetes mellitus without complication ([place])     Pre diabetic  . CHF (congestive heart failure) ([place])   . COPD (chronic obstructive pulmonary disease) ([place])       Most Recent Cardiac Studies: As above   Surgical History:  Past Surgical History  Procedure Laterality Date  . Amputation    . Finger amputation  Traumatic     Home Meds: Prior to Admission medications   Medication Sig Start Date End Date Taking? Authorizing Provider  albuterol (PROVENTIL HFA;VENTOLIN HFA) 108 (90 Base) MCG/ACT inhaler Inhale 2 puffs into the lungs every 6 (six) hours as needed for wheezing or shortness of breath. 12/14/15   Carrie Mew, MD  aspirin EC 81 MG tablet Take 1 tablet (81 mg total) by mouth daily. 12/14/15 12/13/16  Carrie Mew, MD  carvedilol (COREG) 12.5 MG tablet Take 1 tablet (12.5 mg total) by mouth 2 (two) times daily with a meal. 12/14/15   Carrie Mew, MD  furosemide (LASIX) 40 MG tablet Take 1 tablet (40 mg total) by mouth daily. 12/14/15 12/13/16  Carrie Mew, MD  lisinopril (PRINIVIL,ZESTRIL) 20 MG tablet Take 2 tablets (40 mg total) by mouth daily. 12/14/15 12/13/16  Carrie Mew, MD  potassium chloride (K-DUR) 10 MEQ tablet Take 2 tablets (20 mEq total) by mouth daily. 11/24/15   Orbie Pyo, MD  simvastatin (ZOCOR) 40 MG tablet Take 1 tablet (40 mg total) by mouth every evening. 11/24/15 11/23/16  Orbie Pyo, MD    Inpatient Medications:  . aspirin EC  81 mg Oral Daily  . azithromycin  500 mg Intravenous Q24H  . carvedilol  12.5 mg Oral BID WC  . cefTRIAXone (ROCEPHIN)  IV  1 g Intravenous Q24H  . enoxaparin (LOVENOX) injection  110 mg Subcutaneous Q12H  . ipratropium-albuterol  3 mL Nebulization Q4H  . lisinopril  40 mg Oral Daily  . pneumococcal 23 valent vaccine  0.5 mL Intramuscular Tomorrow-1000  . simvastatin  40 mg Oral QPM  . sodium chloride flush  3 mL Intravenous Q12H   . sodium chloride 100 mL/hr at 01/03/16 1617    Allergies:  Allergies  Allergen Reactions  . Prednisone Other (See Comments)    Reaction: Hallucinations     Social History   Social History  . Marital Status: Divorced    Spouse Name: N/A  . Number of Children: N/A  . Years of Education: N/A   Occupational History  . Not on file.   Social History Main Topics  . Smoking status: Current Every Day Smoker -- 0.25 packs/day for 33 years    Types: Cigarettes  . Smokeless tobacco: Never Used  . Alcohol Use: 1.8 oz/week    3 Cans of beer, 0 Standard drinks or equivalent per week     Comment: occassionally  . Drug Use: No  . Sexual Activity: Not on file   Other Topics Concern  . Not on file   Social History Narrative     Family History  Problem Relation Age of Onset    . Diabetes Mellitus II Mother   . Hypertension Father   . Cancer Paternal Aunt   . Cancer Maternal Grandfather      Review of Systems: Review of Systems  Constitutional: Positive for fever, chills, weight loss and malaise/fatigue. Negative for diaphoresis.  HENT: Positive for congestion.   Eyes: Negative for discharge and redness.  Respiratory: Positive for cough, hemoptysis, sputum production, shortness of breath and wheezing.   Cardiovascular: Positive for chest pain. Negative for palpitations, orthopnea, claudication, leg swelling and PND.       Right-sided chest pain associated with deep inspiration and cough  Gastrointestinal: Negative for heartburn, nausea, vomiting and abdominal pain.  Musculoskeletal: Negative for falls.  Skin: Negative for rash.  Neurological: Positive for weakness. Negative for dizziness, sensory change, speech change, focal weakness and loss  of consciousness.  Endo/Heme/Allergies: Does not bruise/bleed easily.  Psychiatric/Behavioral: Positive for substance abuse. The patient is not nervous/anxious.        Ongoing tobacco and etoh abuse. Prior history of marijuana abuse, denies any other illegal drug  All other systems reviewed and are negative.   Labs:  Recent Labs  01/03/16 1621 01/03/16 2132 01/04/16 0418 01/04/16 1009  TROPONINI 0.19* 0.18* 1.05* 0.13*   Lab Results  Component Value Date   WBC 18.0* 01/04/2016   HGB 13.7 01/04/2016   HCT 42.0 01/04/2016   MCV 83.2 01/04/2016   PLT 100* 01/04/2016    Recent Labs Lab 01/04/16 0418  NA 134*  K 3.7  CL 101  CO2 24  BUN 28*  CREATININE 1.19  CALCIUM 7.2*  GLUCOSE 127*   Lab Results  Component Value Date   CHOL 149 03/21/2015   HDL 22* 03/21/2015   LDLCALC 93 03/21/2015   TRIG 170* 03/21/2015   No results found for: DDIMER  Radiology/Studies:  Dg Chest 2 View  01/03/2016  CLINICAL DATA:  Patient states that he has had worsening cough over past 2 weeks which has worsened  today. Patient now has right side chest pain, SOB, diarrhea, and nausea. EXAM: CHEST  2 VIEW COMPARISON:  None. FINDINGS: There is right upper lobe consolidation. There is bilateral chronic interstitial lung disease. There is no pleural effusion or pneumothorax. The heart and mediastinal contours are unremarkable. The osseous structures are unremarkable. IMPRESSION: Right upper lobe pneumonia. Followup PA and lateral chest X-ray is recommended in 3-4 weeks following trial of antibiotic therapy to ensure resolution and exclude underlying malignancy. Electronically Signed   By: Kathreen Devoid   On: 01/03/2016 12:52   Dg Chest 2 View  12/14/2015  CLINICAL DATA:  Cough, shortness of breath. EXAM: CHEST  2 VIEW COMPARISON:  November 24, 2015. FINDINGS: Stable cardiomediastinal silhouette. No pneumothorax or pleural effusion is noted. Mildly increased interstitial densities are noted in both lung bases with possible Kerley B-lines seen on the right concerning for minimal to mild pulmonary edema. Bony thorax is unremarkable. IMPRESSION: Mildly increased bibasilar interstitial densities are noted concerning for possible pulmonary edema. Electronically Signed   By: Marijo Conception, M.D.   On: 12/14/2015 11:02    EKG: NSR, 81 bpm, incomplete RBBB, poor R wave progression, no acute st/t changes  Weights: Filed Weights   01/03/16 1202 01/03/16 1559  Weight: 225 lb (102.059 kg) 241 lb (109.317 kg)     Physical Exam: Blood pressure 93/53, pulse 78, temperature 97.8 F (36.6 C), temperature source Oral, resp. rate 22, height 6\' 1"  (1.854 m), weight 241 lb (109.317 kg), SpO2 97 %. Body mass index is 31.8 kg/(m^2). General: Well developed, well nourished, in no acute distress. Head: Normocephalic, atraumatic, sclera non-icteric, no xanthomas, nares are without discharge.  Neck: Negative for carotid bruits. JVD not elevated. Lungs: Decreased breath sounds bilaterally. Rhonchi right upper lobe. Breathing is  unlabored. Heart: RRR with S1 S2. No murmurs, rubs, or gallops appreciated. Abdomen: Soft, non-tender, non-distended with normoactive bowel sounds. No hepatomegaly. No rebound/guarding. No obvious abdominal masses. Msk:  Strength and tone appear normal for age. Extremities: No clubbing or cyanosis. No edema.  Distal pedal pulses are 2+ and equal bilaterally. Neuro: Sleepy, though oriented X 3. No facial asymmetry. No focal deficit. Moves all extremities spontaneously. Psych:  Responds to questions appropriately with a normal affect.    Assessment and Plan:   1. Elevated troponin: -Initial troponin trend was  mild and flat. At 4:18 AM patient's troponin trended up from 0.18-->1.05. He was asymptomatic besides pleuritic chest pain. His troponin has since down trended back to 0.13 at 10:09 AM.  -Would have increased concern for ACS in the setting of his septic shock if troponin trend was continuing upward; however, this is not the case -He is still at significant risk for ACS given his longstanding tobacco abuse, history of diabetes, HTN, HLD, and obesity -Will check a stat echo today to evaluate the LV systolic function and wall motion  -Given the downward trend of his troponin, and asymptomatic presentation from a cardiac standpoint, will not start heparin gtt at this time -He has also had some blood tinged sputum, though this appears to be stable currently -Hold patient's Coreg, lisinopril, and hydralazine at this time given his hypotension with current blood systolic blood pressure in the 90's, though earlier today in the [id number] systolic -Ultimately, he may require cardiac catheterization once he is stable given the elevated troponin   2. Septic shock in the setting of right upper lobe pneumonia: -Blood pressure has improved some s/p IV fluids -Rapid response was called earlier this AM s/p Coreg and lisinopril when the patient got dizzy while trying to have a BM. This was likely neurocardiogenic  in etiology -Low threshold to transfer to ICU for pressors if BP drops again -Currently with stable MAP in the mid to upper 60's and low 70's -On IV Rocephin and azithromycin per IM -On nebs per IM -Consider steroids -Recommend incentive spirometry   3. Chronic systolic CHF: -He does not appear to be volume overloaded at this time -Coreg and lisinopril are on hold as above given his septic shock -Echo pending as above -CHF education needed prior to discharge   4. COPD/ongoing tobacco abuse: -As above -Per IM  5. HTN: -Hypotensive currently per #2 -Restart meds as able  6. HLD: -Simvastatin  -Check fasting lipid panel  7. DM2: -Check A1c for risk stratification  -Per IM   Signed, Christell Faith, PA-C Pager: [phone] 01/04/2016, 11:14 AM

## 2016-01-04 NOTE — Progress Notes (Signed)
A&O.. Independent. IV fluids infusing. Medications given for pain. IV antibiotics given.

## 2016-01-04 NOTE — Progress Notes (Addendum)
[place] [facility] [place] NAME: Gabriel [name]    MR#:  [id number]  DATE OF BIRTH:  Jun 06, 1971  SUBJECTIVE:  C/o back spasm  REVIEW OF SYSTEMS:    Review of Systems  Constitutional: Negative for fever, chills and malaise/fatigue.  HENT: Negative for ear discharge, ear pain, hearing loss, nosebleeds and sore throat.   Eyes: Negative for blurred vision and pain.  Respiratory: Negative for cough, hemoptysis and wheezing. Shortness of breath: improved.   Cardiovascular: Negative for palpitations and leg swelling. Chest pain: right chest pain.  Gastrointestinal: Negative for nausea, vomiting, abdominal pain, diarrhea and blood in stool.  Genitourinary: Negative for dysuria.  Musculoskeletal: Positive for back pain.  Neurological: Negative for dizziness, tremors, speech change, focal weakness, seizures and headaches.  Endo/Heme/Allergies: Does not bruise/bleed easily.  Psychiatric/Behavioral: Negative for depression, suicidal ideas and hallucinations.    Tolerating Diet:yes      DRUG ALLERGIES:   Allergies  Allergen Reactions  . Prednisone Other (See Comments)    Reaction: Hallucinations     VITALS:  Blood pressure 92/67, pulse 78, temperature 97.8 F (36.6 C), temperature source Oral, resp. rate 22, height 6' 1"  (1.854 m), weight 109.317 kg (241 lb), SpO2 97 %.  PHYSICAL EXAMINATION:   Physical Exam  Constitutional: He is oriented to person, place, and time. He appears distressed.  HENT:  Head: Normocephalic.  Eyes: No scleral icterus.  Neck: Normal range of motion. Neck supple. No JVD present. No tracheal deviation present.  Cardiovascular: Normal rate, regular rhythm and normal heart sounds.  Exam reveals no gallop and no friction rub.   No murmur heard. Pulmonary/Chest: Effort normal. No respiratory distress. He has no wheezes. He has rales. He exhibits no tenderness.  Abdominal: Soft. Bowel sounds are normal. He exhibits no  distension and no mass. There is no tenderness. There is no rebound and no guarding.  Musculoskeletal: Normal range of motion. He exhibits no edema.  Back spasm  Neurological: He is alert and oriented to person, place, and time.  Skin: Skin is warm. No rash noted. No erythema.  Psychiatric: Affect and judgment normal.      LABORATORY PANEL:   CBC  Recent Labs Lab 01/04/16 0418  WBC 18.0*  HGB 13.7  HCT 42.0  PLT 100*   ------------------------------------------------------------------------------------------------------------------  Chemistries   Recent Labs Lab 01/04/16 0418  NA 134*  K 3.7  CL 101  CO2 24  GLUCOSE 127*  BUN 28*  CREATININE 1.19  CALCIUM 7.2*   ------------------------------------------------------------------------------------------------------------------  Cardiac Enzymes  Recent Labs Lab 01/03/16 2132 01/04/16 0418 01/04/16 1009  TROPONINI 0.18* 1.05* 0.13*   ------------------------------------------------------------------------------------------------------------------  RADIOLOGY:  Dg Chest 2 View  01/03/2016  CLINICAL DATA:  Patient states that he has had worsening cough over past 2 weeks which has worsened today. Patient now has right side chest pain, SOB, diarrhea, and nausea. EXAM: CHEST  2 VIEW COMPARISON:  None. FINDINGS: There is right upper lobe consolidation. There is bilateral chronic interstitial lung disease. There is no pleural effusion or pneumothorax. The heart and mediastinal contours are unremarkable. The osseous structures are unremarkable. IMPRESSION: Right upper lobe pneumonia. Followup PA and lateral chest X-ray is recommended in 3-4 weeks following trial of antibiotic therapy to ensure resolution and exclude underlying malignancy. Electronically Signed   By: Kathreen Devoid   On: 01/03/2016 12:52     ASSESSMENT AND PLAN:   45 year old male with nonischemic cardiomyopathy EF of 45-50% in 2016, COPD with ongoing  tobacco abuse and obesity who presented with sepsis in the setting of right upper lobe pneumonia.   1. Septic shock: Patient met sepsis criteria by tachycardia, fever, tachypnea and elevated white blood cell count on arrival. Sepsis is due to community-acquired pneumonia. Continue azithromycin and subtraction. Follow-up on final blood cultures.  2. Back pain: Patient is having active back spasm.  Start Flexeril when necessary.  3. Elevated troponin: Troponin peaked at 1.05 and now is trending down. This is likely due to demand ischemia from problem #1. Appreciate cardiology consult. Follow up on echocardiogram. At some point patient may need cardiac catheterization as per CARDIOLOGY consult. Continue ASA, statin  4. History of essential hypertension: All blood pressure medications on hold due to hypotension/shock.  5. Back spasm: Flexeril PRN 6.tobacco dependence: Counseled by admitting physician Start nicotine patch    Management plans discussed with the patient and she is in agreement.  CODE STATUS: FULL  CRITICAL CARE TOTAL TIME TAKING CARE OF THIS PATIENT: 30 minutes.     POSSIBLE D/C 2-3 days, DEPENDING ON CLINICAL CONDITION.   Mort Smelser M.D on 01/04/2016 at 12:03 PM  Between 7am to 6pm - Pager - [phone] After 6pm go to [email or web address] - password [email or web address] [facility]  Office  [phone]  CC: Primary care physician; No PCP Per Patient  Note: This dictation was prepared with Dragon dictation along with smaller phrase technology. Any transcriptional errors that result from this process are unintentional.

## 2016-01-04 NOTE — Progress Notes (Signed)
ANTICOAGULATION CONSULT NOTE - Initial Consult  Pharmacy Consult for Enoxaparin Indication: chest pain/ACS  Allergies  Allergen Reactions  . Prednisone Other (See Comments)    Reaction: Hallucinations     Patient Measurements: Height: 6\' 1"  (185.4 cm) Weight: 241 lb (109.317 kg) IBW/kg (Calculated) : 79.9  Vital Signs: Temp: 97.8 F (36.6 C) (05/18 0520) Temp Source: Oral (05/18 0520) BP: 88/66 mmHg (05/18 1041) Pulse Rate: 78 (05/18 1026)  Labs:  Recent Labs  01/03/16 1218 01/03/16 1621 01/03/16 2132 01/04/16 0418  HGB 15.1  --   --  13.7  HCT 44.9  --   --  42.0  PLT 163  --   --  100*  CREATININE 0.94  --   --  1.19  TROPONINI 0.22* 0.19* 0.18* 1.05*    Estimated Creatinine Clearance: 100.6 mL/min (by C-G formula based on Cr of 1.19).   Medical History: Past Medical History  Diagnosis Date  . Hypertension     Resolved since weight loss  . Obesity (BMI 30.0-34.9)   . Psoriasis   . Diabetes mellitus without complication ([place])     Pre diabetic  . CHF (congestive heart failure) ([place])   . COPD (chronic obstructive pulmonary disease) ([facility])      Assessment: 45 yo male here with chest pain with troponin that are trending up, consulted to start enoxaparin. Last dose of enoxaparin 40 mg SQ was given 5/17 at 1631.    Goal of Therapy:  Anti-Xa level 0.6-1 units/ml 4hrs after LMWH dose given Monitor platelets by anticoagulation protocol: Yes   Plan:  Will order enoxaparin 1 mg/kg (=110 mg) SQ q12h based on current renal function to start now at time of order (>12h since last ppx dose). CBC and SCr every three days  Rayna Sexton L 01/04/2016,10:43 AM

## 2016-01-04 NOTE — Progress Notes (Signed)
Patients girlfriend, Franchot Erichsen, called and updated per pt request. She is abreast of the situation and rapid response; also aware that patient could have a cardiac cath tomorrow and will have ECHO today.

## 2016-01-05 DIAGNOSIS — I959 Hypotension, unspecified: Secondary | ICD-10-CM

## 2016-01-05 DIAGNOSIS — A419 Sepsis, unspecified organism: Secondary | ICD-10-CM

## 2016-01-05 DIAGNOSIS — A399 Meningococcal infection, unspecified: Secondary | ICD-10-CM | POA: Insufficient documentation

## 2016-01-05 LAB — RAPID HIV SCREEN (HIV 1/2 AB+AG)
HIV 1/2 Antibodies: NONREACTIVE
HIV-1 P24 Antigen - HIV24: NONREACTIVE

## 2016-01-05 LAB — BLOOD GAS, ARTERIAL
Acid-base deficit: 6.1 mmol/L — ABNORMAL HIGH (ref 0.0–2.0)
Bicarbonate: 18.9 mEq/L — ABNORMAL LOW (ref 21.0–28.0)
FIO2: 0.21
O2 Saturation: 89.8 %
Patient temperature: 37
pCO2 arterial: 35 mmHg (ref 32.0–48.0)
pH, Arterial: 7.34 — ABNORMAL LOW (ref 7.350–7.450)
pO2, Arterial: 62 mmHg — ABNORMAL LOW (ref 83.0–108.0)

## 2016-01-05 LAB — EXPECTORATED SPUTUM ASSESSMENT W GRAM STAIN, RFLX TO RESP C

## 2016-01-05 LAB — CBC
HCT: 39.1 % — ABNORMAL LOW (ref 40.0–52.0)
Hemoglobin: 12.9 g/dL — ABNORMAL LOW (ref 13.0–18.0)
MCH: 27.3 pg (ref 26.0–34.0)
MCHC: 32.9 g/dL (ref 32.0–36.0)
MCV: 82.8 fL (ref 80.0–100.0)
Platelets: 140 10*3/uL — ABNORMAL LOW (ref 150–440)
RBC: 4.72 MIL/uL (ref 4.40–5.90)
RDW: 15 % — ABNORMAL HIGH (ref 11.5–14.5)
WBC: 18.8 10*3/uL — ABNORMAL HIGH (ref 3.8–10.6)

## 2016-01-05 LAB — LACTIC ACID, PLASMA: Lactic Acid, Venous: 1.3 mmol/L (ref 0.5–2.0)

## 2016-01-05 LAB — PROTIME-INR
INR: 1.38
Prothrombin Time: 17.1 seconds — ABNORMAL HIGH (ref 11.4–15.0)

## 2016-01-05 LAB — PROCALCITONIN: Procalcitonin: 83.97 ng/mL

## 2016-01-05 MED ORDER — HEPARIN SODIUM (PORCINE) 5000 UNIT/ML IJ SOLN
5000.0000 [IU] | Freq: Three times a day (TID) | INTRAMUSCULAR | Status: DC
  Administered 2016-01-05 – 2016-01-09 (×11): 5000 [IU] via SUBCUTANEOUS
  Filled 2016-01-05 (×12): qty 1

## 2016-01-05 MED ORDER — TAMSULOSIN HCL 0.4 MG PO CAPS
0.4000 mg | ORAL_CAPSULE | Freq: Every day | ORAL | Status: DC
  Administered 2016-01-05 – 2016-01-09 (×5): 0.4 mg via ORAL
  Filled 2016-01-05 (×5): qty 1

## 2016-01-05 MED ORDER — DEXTROSE 5 % IV SOLN
2.0000 g | INTRAVENOUS | Status: DC
  Administered 2016-01-06 – 2016-01-09 (×4): 2 g via INTRAVENOUS
  Filled 2016-01-05 (×4): qty 2

## 2016-01-05 MED ORDER — DEXTROSE 5 % IV SOLN: 500.0000 mg | INTRAVENOUS | Status: DC

## 2016-01-05 NOTE — Progress Notes (Signed)
Patient moving to room 240 on 2A. 2A nurse called and report was given.

## 2016-01-05 NOTE — Progress Notes (Signed)
No distress. Comfortable on RA. No new complaints  Filed Vitals:   01/05/16 1300 01/05/16 1400 01/05/16 1411 01/05/16 1500  BP: 103/70 107/81  109/83  Pulse: 73 75  75  Temp:      TempSrc:      Resp: 15 24  19   Height:      Weight:      SpO2: 90% 93% 94% 90%   NAD HEENT WNL No JVD noted Few scattered rhonchi, no wheezes Reg, no M NABS Ext warm, no edema No focal deficits  BMP Latest Ref Rng 01/04/2016 01/04/2016 01/03/2016  Glucose 65 - 99 mg/dL 114(H) 127(H) 115(H)  BUN 6 - 20 mg/dL 46(H) 28(H) 17  Creatinine 0.61 - 1.24 mg/dL 2.41(H) 1.19 0.94  Sodium 135 - 145 mmol/L 132(L) 134(L) 132(L)  Potassium 3.5 - 5.1 mmol/L 4.1 3.7 3.4(L)  Chloride 101 - 111 mmol/L 101 101 100(L)  CO2 22 - 32 mmol/L 23 24 21(L)  Calcium 8.9 - 10.3 mg/dL 7.4(L) 7.2(L) 8.0(L)    CBC Latest Ref Rng 01/05/2016 01/04/2016 01/03/2016  WBC 3.8 - 10.6 K/uL 18.8(H) 18.0(H) 28.2(H)  Hemoglobin 13.0 - 18.0 g/dL 12.9(L) 13.7 15.1  Hematocrit 40.0 - 52.0 % 39.1(L) 42.0 44.9  Platelets 150 - 440 K/uL 140(L) 100(L) 163    Results for orders placed or performed during the hospital encounter of 01/03/16  Urine culture     Status: None   Collection Time: 01/03/16  1:32 PM  Result Value Ref Range Status   Specimen Description URINE, RANDOM  Final   Special Requests NONE  Final   Culture NO GROWTH Performed at [facility]   Final   Report Status 01/04/2016 FINAL  Final  Blood Culture (routine x 2)     Status: None (Preliminary result)   Collection Time: 01/03/16  1:45 PM  Result Value Ref Range Status   Specimen Description BLOOD RIGHT ARM  Final   Special Requests BOTTLES DRAWN AEROBIC AND ANAEROBIC [id number],[id number]  Final   Culture NO GROWTH 2 DAYS  Final   Report Status PENDING  Incomplete  Blood Culture (routine x 2)     Status: Abnormal (Preliminary result)   Collection Time: 01/03/16  1:45 PM  Result Value Ref Range Status   Specimen Description BLOOD LEFT ASSIST CONTROL  Final   Special  Requests BOTTLES DRAWN AEROBIC AND ANAEROBIC [place]  Final   Culture  Setup Time (A)  Final    GRAM NEGATIVE DIPLOCOCCI AEROBIC BOTTLE ONLY CRITICAL RESULT CALLED TO, READ BACK BY AND VERIFIED WITH: MELISSA [place] AT 1940 01/04/16 MLZ  CONFIRMED BY MSS    Culture NEISSERIA MENINGITIDIS BETA LACTAMASE NEGATIVE  (A)  Final   Report Status PENDING  Incomplete  Blood Culture ID Panel (Reflexed)     Status: Abnormal   Collection Time: 01/03/16  1:45 PM  Result Value Ref Range Status   Enterococcus species NOT DETECTED NOT DETECTED Final   Vancomycin resistance NOT DETECTED NOT DETECTED Final   Listeria monocytogenes NOT DETECTED NOT DETECTED Final   Staphylococcus species NOT DETECTED NOT DETECTED Final   Staphylococcus aureus NOT DETECTED NOT DETECTED Final   Methicillin resistance NOT DETECTED NOT DETECTED Final   Streptococcus species NOT DETECTED NOT DETECTED Final   Streptococcus agalactiae NOT DETECTED NOT DETECTED Final   Streptococcus pneumoniae NOT DETECTED NOT DETECTED Final   Streptococcus pyogenes NOT DETECTED NOT DETECTED Final   Acinetobacter baumannii NOT DETECTED NOT DETECTED Final   Enterobacteriaceae species NOT DETECTED NOT DETECTED  Final   Enterobacter cloacae complex NOT DETECTED NOT DETECTED Final   Escherichia coli NOT DETECTED NOT DETECTED Final   Klebsiella oxytoca NOT DETECTED NOT DETECTED Final   Klebsiella pneumoniae NOT DETECTED NOT DETECTED Final   Proteus species NOT DETECTED NOT DETECTED Final   Serratia marcescens NOT DETECTED NOT DETECTED Final   Carbapenem resistance NOT DETECTED NOT DETECTED Final   Haemophilus influenzae NOT DETECTED NOT DETECTED Final   Neisseria meningitidis DETECTED (A) NOT DETECTED Final    Comment: CALLED TO MELISSA MACCIA AT 1940 01/04/16 MLZ    Pseudomonas aeruginosa NOT DETECTED NOT DETECTED Final   Candida albicans NOT DETECTED NOT DETECTED Final   Candida glabrata NOT DETECTED NOT DETECTED Final   Candida krusei  NOT DETECTED NOT DETECTED Final   Candida parapsilosis NOT DETECTED NOT DETECTED Final   Candida tropicalis NOT DETECTED NOT DETECTED Final  MRSA PCR Screening     Status: None   Collection Time: 01/04/16  4:00 PM  Result Value Ref Range Status   MRSA by PCR NEGATIVE NEGATIVE Final    Comment:        The GeneXpert MRSA Assay (FDA approved for NASAL specimens only), is one component of a comprehensive MRSA colonization surveillance program. It is not intended to diagnose MRSA infection nor to guide or monitor treatment for MRSA infections.   Culture, expectorated sputum-assessment     Status: None   Collection Time: 01/05/16  9:05 AM  Result Value Ref Range Status   Specimen Description EXPECTORATED SPUTUM  Final   Special Requests NONE  Final   Sputum evaluation THIS SPECIMEN IS ACCEPTABLE FOR SPUTUM CULTURE  Final   Report Status 01/05/2016 FINAL  Final   Anti-infectives    Start     Dose/Rate Route Frequency Ordered Stop   01/06/16 0947  cefTRIAXone (ROCEPHIN) 2 g in dextrose 5 % 50 mL IVPB     2 g 100 mL/hr over 30 Minutes Intravenous Every 24 hours 01/05/16 1308     01/05/16 0830  azithromycin (ZITHROMAX) 500 mg in dextrose 5 % 250 mL IVPB  Status:  Discontinued     500 mg 250 mL/hr over 60 Minutes Intravenous Every 24 hours 01/05/16 0818 01/05/16 0819   01/04/16 2330  vancomycin (VANCOCIN) 1,250 mg in sodium chloride 0.9 % 250 mL IVPB  Status:  Discontinued     1,250 mg 166.7 mL/hr over 90 Minutes Intravenous Every 8 hours 01/04/16 1711 01/05/16 0756   01/04/16 2000  cefTRIAXone (ROCEPHIN) 2 g in dextrose 5 % 50 mL IVPB  Status:  Discontinued     2 g 100 mL/hr over 30 Minutes Intravenous Every 12 hours 01/04/16 1948 01/05/16 1308   01/04/16 1800  piperacillin-tazobactam (ZOSYN) IVPB 3.375 g  Status:  Discontinued     3.375 g 12.5 mL/hr over 240 Minutes Intravenous Every 6 hours 01/04/16 1654 01/04/16 1658   01/04/16 1730  piperacillin-tazobactam (ZOSYN) IVPB 3.375 g   Status:  Discontinued     3.375 g 12.5 mL/hr over 240 Minutes Intravenous Every 8 hours 01/04/16 1700 01/05/16 0756   01/04/16 1730  vancomycin (VANCOCIN) IVPB 1000 mg/200 mL premix     1,000 mg 200 mL/hr over 60 Minutes Intravenous  Once 01/04/16 1711 01/04/16 1909   01/04/16 1000  cefTRIAXone (ROCEPHIN) 1 g in dextrose 5 % 50 mL IVPB  Status:  Discontinued     1 g 100 mL/hr over 30 Minutes Intravenous Every 24 hours 01/03/16 1556 01/04/16 1650   01/04/16  1000  azithromycin (ZITHROMAX) 500 mg in dextrose 5 % 250 mL IVPB  Status:  Discontinued     500 mg 250 mL/hr over 60 Minutes Intravenous Every 24 hours 01/03/16 1556 01/04/16 1650   01/03/16 1345  cefTRIAXone (ROCEPHIN) 1 g in dextrose 5 % 50 mL IVPB     1 g 100 mL/hr over 30 Minutes Intravenous  Once 01/03/16 1331 01/03/16 1429   01/03/16 1345  azithromycin (ZITHROMAX) 500 mg in dextrose 5 % 250 mL IVPB     500 mg 250 mL/hr over 60 Minutes Intravenous  Once 01/03/16 1331 01/03/16 1531      IMPRESSION: 1) RUL PNA 2) Neisseria bacteremia - there are no signs of meningitis. Likely pulmonary source 3) COPD, smoker 4) Nonischemic CM (LVEF 45%) 5) mildly elevated trop I - likely stress induced. Cards following 6) AKI, nonoliguric 7) Elevated LFTs/bilirubin - RUQ [place] ordered 8) severe sepsis  PLAN/REC: Agree with transfer to telemetry Cont nebulized steroids and BDs Cont supplemental O2 as needed Cards following ID following Monitor LFTs and F/U RUQ [place]  PCCM will sign off. Please call if we can be of further assistance  Merton Border, MD PCCM service Mobile [phone] Pager [phone] 01/05/2016

## 2016-01-05 NOTE — Consult Note (Signed)
[facility]ious [facility]     Reason for Consult: Meningococcal bacteremia, PNA   Referring Physician: Boykin Reaper Date of Admission:  01/03/2016   Principal Problem:   Sepsis ([place]) Active Problems:   CAP (community acquired pneumonia)   Arterial hypotension   Elevated troponin   Chronic systolic heart failure ([place])   HPI: Gabriel [name]. is a 45 y.o. male admitted 5/17 with 2 weeks cough and sob as well as R pleuritic chest pain. On admit wbc was 28 and temp 101.2. CXR with RUL PNA. Started on vanco and zosyn. Transferred to ICU due to hypotension, elevated troponins.  Also found to have icterus with elevated T bili.  BCX + N  Meningitidis.   He has defervesced and wbc down to 18. Worsening ARF, T bili 6.0 He has a past medical history of cardiomyopathy, chronic systolic CHF, fatty liver, COPD, ongoing tobacco abuse, 2 pack per day.  Past Medical History  Diagnosis Date  . Hypertension     Resolved since weight loss  . Obesity (BMI 30.0-34.9)   . Psoriasis   . Diabetes mellitus without complication ([place])     Pre diabetic  . CHF (congestive heart failure) ([place])   . COPD (chronic obstructive pulmonary disease) [facility])    Past Surgical History  Procedure Laterality Date  . Amputation    . Finger amputation      Traumatic   Social History  Substance Use Topics  . Smoking status: Current Every Day Smoker -- 0.25 packs/day for 33 years    Types: Cigarettes  . Smokeless tobacco: Never Used  . Alcohol Use: 1.8 oz/week    3 Cans of beer, 0 Standard drinks or equivalent per week     Comment: occassionally   Family History  Problem Relation Age of Onset  . Diabetes Mellitus II Mother   . Hypertension Father   . Cancer Paternal Aunt   . Cancer Maternal Grandfather     Allergies:  Allergies  Allergen Reactions  . Prednisone Other (See Comments)    Reaction: Hallucinations     Current antibiotics: Antibiotics Given (last 72 hours)    Date/Time Action  Medication Dose Rate   01/04/16 0900 Given   cefTRIAXone (ROCEPHIN) 1 g in dextrose 5 % 50 mL IVPB 1 g 100 mL/hr   01/04/16 0901 Given   azithromycin (ZITHROMAX) 500 mg in dextrose 5 % 250 mL IVPB 500 mg 250 mL/hr   01/04/16 1809 Given   piperacillin-tazobactam (ZOSYN) IVPB 3.375 g 3.375 g 12.5 mL/hr   01/04/16 1809 Given   vancomycin (VANCOCIN) IVPB 1000 mg/200 mL premix 1,000 mg 200 mL/hr   01/04/16 2027 Given   cefTRIAXone (ROCEPHIN) 2 g in dextrose 5 % 50 mL IVPB 2 g 100 mL/hr   01/05/16 0008 Given   vancomycin (VANCOCIN) 1,250 mg in sodium chloride 0.9 % 250 mL IVPB 1,250 mg 166.7 mL/hr   01/05/16 0339 Given   piperacillin-tazobactam (ZOSYN) IVPB 3.375 g 3.375 g 12.5 mL/hr   01/05/16 0647 Given   vancomycin (VANCOCIN) 1,250 mg in sodium chloride 0.9 % 250 mL IVPB 1,250 mg 166.7 mL/hr   01/05/16 0947 Given   cefTRIAXone (ROCEPHIN) 2 g in dextrose 5 % 50 mL IVPB 2 g 100 mL/hr      MEDICATIONS: . aspirin EC  81 mg Oral Daily  . budesonide (PULMICORT) nebulizer solution  0.25 mg Nebulization BID  . cefTRIAXone (ROCEPHIN)  IV  2 g Intravenous Q12H  . heparin subcutaneous  5,000 Units Subcutaneous Q8H  . ipratropium-albuterol  3 mL Nebulization Q6H  . nicotine  21 mg Transdermal Daily  . pneumococcal 23 valent vaccine  0.5 mL Intramuscular Tomorrow-1000  . simvastatin  40 mg Oral QPM  . sodium chloride flush  3 mL Intravenous Q12H    Review of Systems - 11 systems reviewed and negative per HPI   OBJECTIVE: Temp:  [97.1 F (36.2 C)-98.4 F (36.9 C)] 97.1 F (36.2 C) (05/19 1200) Pulse Rate:  [68-86] 73 (05/19 1200) Resp:  [15-35] 22 (05/19 1200) BP: (80-143)/(50-121) 93/56 mmHg (05/19 1200) SpO2:  [90 %-100 %] 90 % (05/19 1200) FiO2 (%):  [21 %] 21 % (05/19 0850) Weight:  [114.1 kg (251 lb 8.7 oz)] 114.1 kg (251 lb 8.7 oz) (05/18 1700) Physical Exam  Constitutional: He is oriented to person, place, and time. Obese, dishelved. HENT: perrla, mild icterus Mouth/Throat:  Oropharynx is clear and dry . No oropharyngeal exudate.  Cardiovascular: Normal rate, regular rhythm and normal heart sounds.  Pulmonary/Chest: rhonci bil Abdominal: Soft. Bowel sounds are normal. Mild  distension. There is no tenderness.  Lymphadenopathy: He has no cervical adenopathy.  Neurological: He is alert and oriented to person, place, and time.  Skin: Skin is warm and dry. No rash noted. No erythema. No purpura Psychiatric: He has a normal mood and affect. His behavior is normal.   LABS: Results for orders placed or performed during the hospital encounter of 01/03/16 (from the past 48 hour(s))  Urine culture     Status: None   Collection Time: 01/03/16  1:32 PM  Result Value Ref Range   Specimen Description URINE, RANDOM    Special Requests NONE    Culture NO GROWTH Performed [facility]     Report Status 01/04/2016 FINAL   Blood Culture (routine x 2)     Status: None (Preliminary result)   Collection Time: 01/03/16  1:45 PM  Result Value Ref Range   Specimen Description BLOOD RIGHT ARM    Special Requests BOTTLES DRAWN AEROBIC AND ANAEROBIC 5CCAERO,4CCANA    Culture NO GROWTH 1 DAY    Report Status PENDING   Blood Culture (routine x 2)     Status: Abnormal (Preliminary result)   Collection Time: 01/03/16  1:45 PM  Result Value Ref Range   Specimen Description BLOOD LEFT ASSIST CONTROL    Special Requests BOTTLES DRAWN AEROBIC AND ANAEROBIC 1CCAERO,2CCANA    Culture  Setup Time (A)     GRAM NEGATIVE DIPLOCOCCI AEROBIC BOTTLE ONLY CRITICAL RESULT CALLED TO, READ BACK BY AND VERIFIED WITH: MELISSA Stanley AT 1938 01/04/16 MLZ  CONFIRMED BY MSS    Culture NEISSERIA MENINGITIDIS BETA LACTAMASE NEGATIVE  (A)    Report Status PENDING   Lactic acid, plasma     Status: None   Collection Time: 01/03/16  1:45 PM  Result Value Ref Range   Lactic Acid, Venous 2.0 0.5 - 2.0 mmol/L  Blood Culture ID Panel (Reflexed)     Status: Abnormal   Collection Time: 01/03/16   1:45 PM  Result Value Ref Range   Enterococcus species NOT DETECTED NOT DETECTED   Vancomycin resistance NOT DETECTED NOT DETECTED   Listeria monocytogenes NOT DETECTED NOT DETECTED   Staphylococcus species NOT DETECTED NOT DETECTED   Staphylococcus aureus NOT DETECTED NOT DETECTED   Methicillin resistance NOT DETECTED NOT DETECTED   Streptococcus species NOT DETECTED NOT DETECTED   Streptococcus agalactiae NOT DETECTED NOT DETECTED   Streptococcus pneumoniae NOT DETECTED  NOT DETECTED   Streptococcus pyogenes NOT DETECTED NOT DETECTED   Acinetobacter baumannii NOT DETECTED NOT DETECTED   Enterobacteriaceae species NOT DETECTED NOT DETECTED   Enterobacter cloacae complex NOT DETECTED NOT DETECTED   Escherichia coli NOT DETECTED NOT DETECTED   Klebsiella oxytoca NOT DETECTED NOT DETECTED   Klebsiella pneumoniae NOT DETECTED NOT DETECTED   Proteus species NOT DETECTED NOT DETECTED   Serratia marcescens NOT DETECTED NOT DETECTED   Carbapenem resistance NOT DETECTED NOT DETECTED   Haemophilus influenzae NOT DETECTED NOT DETECTED   Neisseria meningitidis DETECTED (A) NOT DETECTED    Comment: CALLED TO MELISSA MACCIA AT 1938 01/04/16 MLZ    Pseudomonas aeruginosa NOT DETECTED NOT DETECTED   Candida albicans NOT DETECTED NOT DETECTED   Candida glabrata NOT DETECTED NOT DETECTED   Candida krusei NOT DETECTED NOT DETECTED   Candida parapsilosis NOT DETECTED NOT DETECTED   Candida tropicalis NOT DETECTED NOT DETECTED  Urinalysis complete, with microscopic ([place] only)     Status: Abnormal   Collection Time: 01/03/16  2:52 PM  Result Value Ref Range   Color, Urine AMBER (A) YELLOW   APPearance CLEAR (A) CLEAR   Glucose, UA NEGATIVE NEGATIVE mg/dL   Bilirubin Urine NEGATIVE NEGATIVE   Ketones, ur NEGATIVE NEGATIVE mg/dL   Specific Gravity, Urine 1.013 1.005 - 1.030   Hgb urine dipstick 1+ (A) NEGATIVE   pH 5.0 5.0 - 8.0   Protein, ur 100 (A) NEGATIVE mg/dL   Nitrite NEGATIVE NEGATIVE    Leukocytes, UA NEGATIVE NEGATIVE   RBC / HPF 0-5 0 - 5 RBC/hpf   WBC, UA 0-5 0 - 5 WBC/hpf   Bacteria, UA RARE (A) NONE SEEN   Squamous Epithelial / LPF 0-5 (A) NONE SEEN   Mucous PRESENT   Lactic acid, plasma     Status: Abnormal   Collection Time: 01/03/16  4:21 PM  Result Value Ref Range   Lactic Acid, Venous 3.6 (HH) 0.5 - 2.0 mmol/L    Comment: CRITICAL RESULT CALLED TO, READ BACK BY AND VERIFIED WITH ASHLEY WILLIAM AT 1725 01/03/16 MLZ   Troponin I (q 6hr x 3)     Status: Abnormal   Collection Time: 01/03/16  4:21 PM  Result Value Ref Range   Troponin I 0.19 (H) <0.031 ng/mL    Comment: PREVIOUS RESULT CALLED AT 1315 01/03/16 MSS.MLZ        PERSISTENTLY INCREASED TROPONIN VALUES IN THE RANGE OF 0.04-0.49 ng/mL CAN BE SEEN IN:       -UNSTABLE ANGINA       -CONGESTIVE HEART FAILURE       -MYOCARDITIS       -CHEST TRAUMA       -ARRYHTHMIAS       -LATE PRESENTING MYOCARDIAL INFARCTION       -COPD   CLINICAL FOLLOW-UP RECOMMENDED.   Troponin I (q 6hr x 3)     Status: Abnormal   Collection Time: 01/03/16  9:32 PM  Result Value Ref Range   Troponin I 0.18 (H) <0.031 ng/mL    Comment: PREVIOUS RESULT CALLED AT 1315 01/03/16 MSS...MLZ        PERSISTENTLY INCREASED TROPONIN VALUES IN THE RANGE OF 0.04-0.49 ng/mL CAN BE SEEN IN:       -UNSTABLE ANGINA       -CONGESTIVE HEART FAILURE       -MYOCARDITIS       -CHEST TRAUMA       -ARRYHTHMIAS       -LATE  PRESENTING MYOCARDIAL INFARCTION       -COPD   CLINICAL FOLLOW-UP RECOMMENDED.   Troponin I (q 6hr x 3)     Status: Abnormal   Collection Time: 01/04/16  4:18 AM  Result Value Ref Range   Troponin I 1.05 (H) <0.031 ng/mL    Comment: PREVIOUS RESULT CALLED TO GREG MOYER ON 01/03/16 AT 1315 BY MSS/TLB        POSSIBLE MYOCARDIAL ISCHEMIA. SERIAL TESTING RECOMMENDED.   Basic metabolic panel     Status: Abnormal   Collection Time: 01/04/16  4:18 AM  Result Value Ref Range   Sodium 134 (L) 135 - 145 mmol/L   Potassium  3.7 3.5 - 5.1 mmol/L   Chloride 101 101 - 111 mmol/L   CO2 24 22 - 32 mmol/L   Glucose, Bld 127 (H) 65 - 99 mg/dL   BUN 28 (H) 6 - 20 mg/dL   Creatinine, Ser 1.19 0.61 - 1.24 mg/dL   Calcium 7.2 (L) 8.9 - 10.3 mg/dL   GFR calc non Af Amer >60 >60 mL/min   GFR calc Af Amer >60 >60 mL/min    Comment: (NOTE) The eGFR has been calculated using the CKD EPI equation. This calculation has not been validated in all clinical situations. eGFR's persistently <60 mL/min signify possible Chronic Kidney Disease.    Anion gap 9 5 - 15  CBC     Status: Abnormal   Collection Time: 01/04/16  4:18 AM  Result Value Ref Range   WBC 18.0 (H) 3.8 - 10.6 K/uL   RBC 5.05 4.40 - 5.90 MIL/uL   Hemoglobin 13.7 13.0 - 18.0 g/dL   HCT 42.0 40.0 - 52.0 %   MCV 83.2 80.0 - 100.0 fL   MCH 27.2 26.0 - 34.0 pg   MCHC 32.7 32.0 - 36.0 g/dL   RDW 15.2 (H) 11.5 - 14.5 %   Platelets 100 (L) 150 - 440 K/uL  Troponin I     Status: Abnormal   Collection Time: 01/04/16 10:09 AM  Result Value Ref Range   Troponin I 0.13 (H) <0.031 ng/mL    Comment: PREVIOUS RESULT CALLED 01/03/16 1315 BY MSS/SGD        PERSISTENTLY INCREASED TROPONIN VALUES IN THE RANGE OF 0.04-0.49 ng/mL CAN BE SEEN IN:       -UNSTABLE ANGINA       -CONGESTIVE HEART FAILURE       -MYOCARDITIS       -CHEST TRAUMA       -ARRYHTHMIAS       -LATE PRESENTING MYOCARDIAL INFARCTION       -COPD   CLINICAL FOLLOW-UP RECOMMENDED.   Glucose, capillary     Status: Abnormal   Collection Time: 01/04/16 10:28 AM  Result Value Ref Range   Glucose-Capillary 213 (H) 65 - 99 mg/dL   Comment 1 Notify RN   Troponin I     Status: Abnormal   Collection Time: 01/04/16  2:01 PM  Result Value Ref Range   Troponin I 0.22 (H) <0.031 ng/mL    Comment: READ BACK AND VERIFIED WITH MATTIE  HIMES AT 1610 01/04/16 SDR        PERSISTENTLY INCREASED TROPONIN VALUES IN THE RANGE OF 0.04-0.49 ng/mL CAN BE SEEN IN:       -UNSTABLE ANGINA       -CONGESTIVE HEART FAILURE        -MYOCARDITIS       -CHEST TRAUMA       -ARRYHTHMIAS       -  LATE PRESENTING MYOCARDIAL INFARCTION       -COPD   CLINICAL FOLLOW-UP RECOMMENDED.   Hemoglobin A1c     Status: None   Collection Time: 01/04/16  2:01 PM  Result Value Ref Range   Hgb A1c MFr Bld 5.6 4.0 - 6.0 %  Lipid panel     Status: Abnormal   Collection Time: 01/04/16  2:01 PM  Result Value Ref Range   Cholesterol 74 0 - 200 mg/dL   Triglycerides 71 <150 mg/dL   HDL 14 (L) >40 mg/dL   Total CHOL/HDL Ratio 5.3 RATIO   VLDL 14 0 - 40 mg/dL   LDL Cholesterol 46 0 - 99 mg/dL    Comment:        Total Cholesterol/HDL:CHD Risk Coronary Heart Disease Risk Table                     Men   Women  1/2 Average Risk   3.4   3.3  Average Risk       5.0   4.4  2 X Average Risk   9.6   7.1  3 X Average Risk  23.4   11.0        Use the calculated Patient Ratio above and the CHD Risk Table to determine the patient's CHD Risk.        ATP III CLASSIFICATION (LDL):  <100     mg/dL   Optimal  100-129  mg/dL   Near or Above                    Optimal  130-159  mg/dL   Borderline  160-189  mg/dL   High  >190     mg/dL   Very High   MRSA PCR Screening     Status: None   Collection Time: 01/04/16  4:00 PM  Result Value Ref Range   MRSA by PCR NEGATIVE NEGATIVE    Comment:        The GeneXpert MRSA Assay (FDA approved for NASAL specimens only), is one component of a comprehensive MRSA colonization surveillance program. It is not intended to diagnose MRSA infection nor to guide or monitor treatment for MRSA infections.   Glucose, capillary     Status: Abnormal   Collection Time: 01/04/16  4:14 PM  Result Value Ref Range   Glucose-Capillary 140 (H) 65 - 99 mg/dL  Procalcitonin - Baseline     Status: None   Collection Time: 01/04/16  5:09 PM  Result Value Ref Range   Procalcitonin 92.35 ng/mL    Comment:        Interpretation: PCT >= 10 ng/mL: Important systemic inflammatory response, almost exclusively due to  severe bacterial sepsis or septic shock. (NOTE)         ICU PCT Algorithm               Non ICU PCT Algorithm    ----------------------------     ------------------------------         PCT < 0.25 ng/mL                 PCT < 0.1 ng/mL     Stopping of antibiotics            Stopping of antibiotics       strongly encouraged.               strongly encouraged.    ----------------------------     ------------------------------  PCT level decrease by               PCT < 0.25 ng/mL       >= 80% from peak PCT       OR PCT 0.25 - 0.5 ng/mL          Stopping of antibiotics                                             encouraged.     Stopping of antibiotics           encouraged.    ----------------------------     ------------------------------       PCT level decrease by              PCT >= 0.25 ng/mL       < 80% from peak PCT        AND PCT >= 0.5 ng/mL             Continuing antibiotics                                              encouraged.       Continuing antibiotics            encouraged.    ----------------------------     ------------------------------     PCT level increase compared          PCT > 0.5 ng/mL         with peak PCT AND          PCT >= 0.5 ng/mL             Escalation of antibiotics                                          strongly encouraged.      Escalation of antibiotics        strongly encouraged.   Troponin I     Status: Abnormal   Collection Time: 01/04/16  8:20 PM  Result Value Ref Range   Troponin I 0.53 (H) <0.031 ng/mL    Comment: PREVIOUS RESULT CALLED AT 1502 01/04/16 SDR.Marland KitchenMLZ        POSSIBLE MYOCARDIAL ISCHEMIA. SERIAL TESTING RECOMMENDED.   Comprehensive metabolic panel     Status: Abnormal   Collection Time: 01/04/16  8:20 PM  Result Value Ref Range   Sodium 132 (L) 135 - 145 mmol/L   Potassium 4.1 3.5 - 5.1 mmol/L   Chloride 101 101 - 111 mmol/L   CO2 23 22 - 32 mmol/L   Glucose, Bld 114 (H) 65 - 99 mg/dL   BUN 46 (H) 6 - 20 mg/dL    Creatinine, Ser 2.41 (H) 0.61 - 1.24 mg/dL   Calcium 7.4 (L) 8.9 - 10.3 mg/dL   Total Protein 6.0 (L) 6.5 - 8.1 g/dL   Albumin 2.7 (L) 3.5 - 5.0 g/dL   AST 131 (H) 15 - 41 U/L   ALT 96 (H) 17 - 63 U/L   Alkaline Phosphatase 56 38 - 126 U/L   Total Bilirubin 4.2 (H) 0.3 - 1.2 mg/dL   GFR calc  non Af Amer 31 (L) >60 mL/min   GFR calc Af Amer 35 (L) >60 mL/min    Comment: (NOTE) The eGFR has been calculated using the CKD EPI equation. This calculation has not been validated in all clinical situations. eGFR's persistently <60 mL/min signify possible Chronic Kidney Disease.    Anion gap 8 5 - 15  Bilirubin, direct     Status: Abnormal   Collection Time: 01/04/16  8:20 PM  Result Value Ref Range   Bilirubin, Direct 1.9 (H) 0.1 - 0.5 mg/dL  Blood gas, arterial     Status: Abnormal   Collection Time: 01/05/16  3:49 AM  Result Value Ref Range   FIO2 0.21    Delivery systems ROOM AIR    pH, Arterial 7.34 (L) 7.350 - 7.450   pCO2 arterial 35 32.0 - 48.0 mmHg   pO2, Arterial 62 (L) 83.0 - 108.0 mmHg   Bicarbonate 18.9 (L) 21.0 - 28.0 mEq/L   Acid-base deficit 6.1 (H) 0.0 - 2.0 mmol/L   O2 Saturation 89.8 %   Patient temperature 37.0    Collection site LEFT BRACHIAL    Sample type ARTERIAL DRAW    Allens test (pass/fail) PASS PASS  CBC     Status: Abnormal   Collection Time: 01/05/16  6:13 AM  Result Value Ref Range   WBC 18.8 (H) 3.8 - 10.6 K/uL   RBC 4.72 4.40 - 5.90 MIL/uL   Hemoglobin 12.9 (L) 13.0 - 18.0 g/dL   HCT 39.1 (L) 40.0 - 52.0 %   MCV 82.8 80.0 - 100.0 fL   MCH 27.3 26.0 - 34.0 pg   MCHC 32.9 32.0 - 36.0 g/dL   RDW 15.0 (H) 11.5 - 14.5 %   Platelets 140 (L) 150 - 440 K/uL  Procalcitonin     Status: None   Collection Time: 01/05/16  6:13 AM  Result Value Ref Range   Procalcitonin 83.97 ng/mL    Comment:        Interpretation: PCT >= 10 ng/mL: Important systemic inflammatory response, almost exclusively due to severe bacterial sepsis or septic shock. (NOTE)          ICU PCT Algorithm               Non ICU PCT Algorithm    ----------------------------     ------------------------------         PCT < 0.25 ng/mL                 PCT < 0.1 ng/mL     Stopping of antibiotics            Stopping of antibiotics       strongly encouraged.               strongly encouraged.    ----------------------------     ------------------------------       PCT level decrease by               PCT < 0.25 ng/mL       >= 80% from peak PCT       OR PCT 0.25 - 0.5 ng/mL          Stopping of antibiotics                                             encouraged.     Stopping  of antibiotics           encouraged.    ----------------------------     ------------------------------       PCT level decrease by              PCT >= 0.25 ng/mL       < 80% from peak PCT        AND PCT >= 0.5 ng/mL             Continuing antibiotics                                              encouraged.       Continuing antibiotics            encouraged.    ----------------------------     ------------------------------     PCT level increase compared          PCT > 0.5 ng/mL         with peak PCT AND          PCT >= 0.5 ng/mL             Escalation of antibiotics                                          strongly encouraged.      Escalation of antibiotics        strongly encouraged.   Lactic acid, plasma     Status: None   Collection Time: 01/05/16  6:13 AM  Result Value Ref Range   Lactic Acid, Venous 1.3 0.5 - 2.0 mmol/L  Protime-INR     Status: Abnormal   Collection Time: 01/05/16  9:57 AM  Result Value Ref Range   Prothrombin Time 17.1 (H) 11.4 - 15.0 seconds   INR 1.38    No components found for: ESR, C REACTIVE PROTEIN MICRO: Recent Results (from the past 720 hour(s))  Urine culture     Status: None   Collection Time: 01/03/16  1:32 PM  Result Value Ref Range Status   Specimen Description URINE, RANDOM  Final   Special Requests NONE  Final   Culture NO GROWTH Performed at [facility]   Final   Report Status 01/04/2016 FINAL  Final  Blood Culture (routine x 2)     Status: None (Preliminary result)   Collection Time: 01/03/16  1:45 PM  Result Value Ref Range Status   Specimen Description BLOOD RIGHT ARM  Final   Special Requests BOTTLES DRAWN AEROBIC AND ANAEROBIC 5CCAERO,4CCANA  Final   Culture NO GROWTH 1 DAY  Final   Report Status PENDING  Incomplete  Blood Culture (routine x 2)     Status: Abnormal (Preliminary result)   Collection Time: 01/03/16  1:45 PM  Result Value Ref Range Status   Specimen Description BLOOD LEFT ASSIST CONTROL  Final   Special Requests BOTTLES DRAWN AEROBIC AND ANAEROBIC 1CCAERO,2CCANA  Final   Culture  Setup Time (A)  Final    GRAM NEGATIVE DIPLOCOCCI AEROBIC BOTTLE ONLY CRITICAL RESULT CALLED TO, READ BACK BY AND VERIFIED WITH: MELISSA [place]more AT 1938 01/04/16 MLZ  CONFIRMED BY MSS    Culture NEISSERIA MENINGITIDIS BETA LACTAMASE NEGATIVE  (A)  Final   Report Status  PENDING  Incomplete  Blood Culture ID Panel (Reflexed)     Status: Abnormal   Collection Time: 01/03/16  1:45 PM  Result Value Ref Range Status   Enterococcus species NOT DETECTED NOT DETECTED Final   Vancomycin resistance NOT DETECTED NOT DETECTED Final   Listeria monocytogenes NOT DETECTED NOT DETECTED Final   Staphylococcus species NOT DETECTED NOT DETECTED Final   Staphylococcus aureus NOT DETECTED NOT DETECTED Final   Methicillin resistance NOT DETECTED NOT DETECTED Final   Streptococcus species NOT DETECTED NOT DETECTED Final   Streptococcus agalactiae NOT DETECTED NOT DETECTED Final   Streptococcus pneumoniae NOT DETECTED NOT DETECTED Final   Streptococcus pyogenes NOT DETECTED NOT DETECTED Final   Acinetobacter baumannii NOT DETECTED NOT DETECTED Final   Enterobacteriaceae species NOT DETECTED NOT DETECTED Final   Enterobacter cloacae complex NOT DETECTED NOT DETECTED Final   Escherichia coli NOT DETECTED NOT DETECTED Final   Klebsiella oxytoca  NOT DETECTED NOT DETECTED Final   Klebsiella pneumoniae NOT DETECTED NOT DETECTED Final   Proteus species NOT DETECTED NOT DETECTED Final   Serratia marcescens NOT DETECTED NOT DETECTED Final   Carbapenem resistance NOT DETECTED NOT DETECTED Final   Haemophilus influenzae NOT DETECTED NOT DETECTED Final   Neisseria meningitidis DETECTED (A) NOT DETECTED Final    Comment: CALLED TO MELISSA MACCIA AT 1938 01/04/16 MLZ    Pseudomonas aeruginosa NOT DETECTED NOT DETECTED Final   Candida albicans NOT DETECTED NOT DETECTED Final   Candida glabrata NOT DETECTED NOT DETECTED Final   Candida krusei NOT DETECTED NOT DETECTED Final   Candida parapsilosis NOT DETECTED NOT DETECTED Final   Candida tropicalis NOT DETECTED NOT DETECTED Final  MRSA PCR Screening     Status: None   Collection Time: 01/04/16  4:00 PM  Result Value Ref Range Status   MRSA by PCR NEGATIVE NEGATIVE Final    Comment:        The GeneXpert MRSA Assay (FDA approved for NASAL specimens only), is one component of a comprehensive MRSA colonization surveillance program. It is not intended to diagnose MRSA infection nor to guide or monitor treatment for MRSA infections.     IMAGING: Dg Chest 1 View  01/04/2016  CLINICAL DATA:  Central line placement. EXAM: CHEST 1 VIEW COMPARISON:  Chest x-rays dated 01/03/2016, 12/14/2015 and 03/20/2015. FINDINGS: The right upper lobe consolidation is increasingly dense on today's exam, again compatible with pneumonia. There is a stable mild prominence of the interstitial markings within each lung. Right IJ central line has been placed with tip well positioned at the level of the lower SVC. No pneumothorax. IMPRESSION: 1. Right IJ central line placement. Tip of the line appears well positioned at the level of the lower SVC. No pneumothorax or other complicating feature identified. 2. Right upper lobe pneumonia, increased in density compared to yesterday's chest x-ray. Electronically Signed   By:  Franki Cabot M.D.   On: 01/04/2016 17:09   Dg Chest 2 View  01/03/2016  CLINICAL DATA:  Patient states that he has had worsening cough over past 2 weeks which has worsened today. Patient now has right side chest pain, SOB, diarrhea, and nausea. EXAM: CHEST  2 VIEW COMPARISON:  None. FINDINGS: There is right upper lobe consolidation. There is bilateral chronic interstitial lung disease. There is no pleural effusion or pneumothorax. The heart and mediastinal contours are unremarkable. The osseous structures are unremarkable. IMPRESSION: Right upper lobe pneumonia. Followup PA and lateral chest X-ray is recommended in 3-4 weeks following  trial of antibiotic therapy to ensure resolution and exclude underlying malignancy. Electronically Signed   By: Kathreen Devoid   On: 01/03/2016 12:52   Dg Chest 2 View  12/14/2015  CLINICAL DATA:  Cough, shortness of breath. EXAM: CHEST  2 VIEW COMPARISON:  November 24, 2015. FINDINGS: Stable cardiomediastinal silhouette. No pneumothorax or pleural effusion is noted. Mildly increased interstitial densities are noted in both lung bases with possible Kerley B-lines seen on the right concerning for minimal to mild pulmonary edema. Bony thorax is unremarkable. IMPRESSION: Mildly increased bibasilar interstitial densities are noted concerning for possible pulmonary edema. Electronically Signed   By: Marijo Conception, M.D.   On: 12/14/2015 11:02    Assessment:   Gabriel Rammel. is a 45 y.o. male with COPD, CHF, Fatty liver admitted with RUL pna with 2 weeks illness and found to have Neisseria meningitidis bacteremia (5/17) with no signs of meningitis or purpura.  He has defervesce with IV abx and is relatively stable, not on pressors. Does also have elevated T bili and Cr.   Recommendations Ceftriaxone 2 gm IV q 24 - if clinically improving would rec a duration of 7 days.  If ready for dc prior to completion of 7 day course could be given IM at home. Check HIV and CH50 to screen  for complement deficiency I have spoken with IP and the Health dept- he lives with his 3 children and his girlfriend. I advised HD that I would suggest prophylaxis with either rifampin, IM ceftriaxone or ciprofloxacin for close contacts.  Dr Hinton Lovely in IP at [facility] is working with [employer] health regarding ppx for staff [employer] precautions since has had 24 hours of abx   Thank you very much for allowing me to participate in the care of this patient. Please call with questions.   Cheral Marker. Ola Spurr, MD

## 2016-01-05 NOTE — Progress Notes (Signed)
Patient transferred from CCU, oriented to room, fall, and safety contract reviewed. Focused assessment as charted. No complaints at this time. Skin verified with Malachy Mood. Telemetry box verified. Wilnette Kales

## 2016-01-05 NOTE — Progress Notes (Signed)
[place] [facility] [place] NAME: Gabriel [name]    MR#:  [id number]  DATE OF BIRTH:  Nov 22, 1970  SUBJECTIVE:  Feels weak.  Her sputum. Mild back pain. Afebrile  REVIEW OF SYSTEMS:    Review of Systems  Constitutional: Negative for fever, chills and malaise/fatigue.  HENT: Negative for ear discharge, ear pain, hearing loss, nosebleeds and sore throat.   Eyes: Negative for blurred vision and pain.  Respiratory: Negative for cough, hemoptysis and wheezing. Shortness of breath: improved.   Cardiovascular: Negative for palpitations and leg swelling. Chest pain: right chest pain.  Gastrointestinal: Negative for nausea, vomiting, abdominal pain, diarrhea and blood in stool.  Genitourinary: Negative for dysuria.  Musculoskeletal: Positive for back pain.  Neurological: Negative for dizziness, tremors, speech change, focal weakness, seizures and headaches.  Endo/Heme/Allergies: Does not bruise/bleed easily.  Psychiatric/Behavioral: Negative for depression, suicidal ideas and hallucinations.   Tolerating Diet:yes  DRUG ALLERGIES:   Allergies  Allergen Reactions  . Prednisone Other (See Comments)    Reaction: Hallucinations     VITALS:  Blood pressure 114/87, pulse 68, temperature 97.8 F (36.6 C), temperature source Oral, resp. rate 21, height 6\' 1"  (1.854 m), weight 114.1 kg (251 lb 8.7 oz), SpO2 93 %.  PHYSICAL EXAMINATION:   Physical Exam  Constitutional: He is oriented to person, place, and time. He appears distressed.  HENT:  Head: Normocephalic.  Eyes: No scleral icterus.  Neck: Normal range of motion. Neck supple. No JVD present. No tracheal deviation present.  Cardiovascular: Normal rate, regular rhythm and normal heart sounds.  Exam reveals no gallop and no friction rub.   No murmur heard. Pulmonary/Chest: Effort normal. No respiratory distress. He has no wheezes. He has rales. He exhibits no tenderness.  Abdominal: Soft. Bowel  sounds are normal. He exhibits no distension and no mass. There is no tenderness. There is no rebound and no guarding.  Musculoskeletal: Normal range of motion. He exhibits no edema.  Back spasm  Neurological: He is alert and oriented to person, place, and time.  Skin: Skin is warm. No rash noted. No erythema.  Psychiatric: Affect and judgment normal.   LABORATORY PANEL:   CBC  Recent Labs Lab 01/05/16 0613  WBC 18.8*  HGB 12.9*  HCT 39.1*  PLT 140*   ------------------------------------------------------------------------------------------------------------------  Chemistries   Recent Labs Lab 01/04/16 2020  NA 132*  K 4.1  CL 101  CO2 23  GLUCOSE 114*  BUN 46*  CREATININE 2.41*  CALCIUM 7.4*  AST 131*  ALT 96*  ALKPHOS 56  BILITOT 4.2*   ------------------------------------------------------------------------------------------------------------------  Cardiac Enzymes  Recent Labs Lab 01/04/16 1009 01/04/16 1401 01/04/16 2020  TROPONINI 0.13* 0.22* 0.53*   ------------------------------------------------------------------------------------------------------------------  RADIOLOGY:  Dg Chest 1 View  01/04/2016  CLINICAL DATA:  Central line placement. EXAM: CHEST 1 VIEW COMPARISON:  Chest x-rays dated 01/03/2016, 12/14/2015 and 03/20/2015. FINDINGS: The right upper lobe consolidation is increasingly dense on today's exam, again compatible with pneumonia. There is a stable mild prominence of the interstitial markings within each lung. Right IJ central line has been placed with tip well positioned at the level of the lower SVC. No pneumothorax. IMPRESSION: 1. Right IJ central line placement. Tip of the line appears well positioned at the level of the lower SVC. No pneumothorax or other complicating feature identified. 2. Right upper lobe pneumonia, increased in density compared to yesterday's chest x-ray. Electronically Signed   By: Franki Cabot M.D.   On:  01/04/2016 17:09   Dg Chest 2 View  01/03/2016  CLINICAL DATA:  Patient states that he has had worsening cough over past 2 weeks which has worsened today. Patient now has right side chest pain, SOB, diarrhea, and nausea. EXAM: CHEST  2 VIEW COMPARISON:  None. FINDINGS: There is right upper lobe consolidation. There is bilateral chronic interstitial lung disease. There is no pleural effusion or pneumothorax. The heart and mediastinal contours are unremarkable. The osseous structures are unremarkable. IMPRESSION: Right upper lobe pneumonia. Followup PA and lateral chest X-ray is recommended in 3-4 weeks following trial of antibiotic therapy to ensure resolution and exclude underlying malignancy. Electronically Signed   By: Kathreen Devoid   On: 01/03/2016 12:52     ASSESSMENT AND PLAN:   45 year old male with nonischemic cardiomyopathy EF of 45-50% in 2016, COPD with ongoing tobacco abuse and obesity who presented with sepsis in the setting of right upper lobe pneumonia.  * Neisseria meningitidis bacteremia due to right upper lobe pneumonia with severe sepsis On ceftriaxone 2 g every 12 hours. No signs of meningitis. No purpura. Start droplet isolation. Consult infectious disease. Discussed with Dr. Ola Spurr Discussed with Dr. Leonidas Romberg of pulmonary.  * Acute renal failure due to ATN from sepsis Monitor input and output. Patient has ejection fraction 20-25%. Need to be very cautious with fluids. Repeat labs in the morning  * Hyperbilirubinemia This seems chronic. We'll check acute hepatitis panel. Ultrasound right upper quadrant. Liver function tests with no significant elevation. Check INR  * Elevated troponin: Troponin peaked at 1.05 and now is trending down. This is likely due to demand ischemia from problem #1 [place] cardiology consult. Echo showed EF 20-25% At some point patient may need cardiac catheterization as per CARDIOLOGY consult. Continue ASA, statin  * Chronic systolic  CHF History of mild pulmonary edema on chest x-ray. He also has sepsis and acute renal failure. We will stop IV fluids. Lasix on hold Start Lasix if CHF is worsening.  * History of essential hypertension: All blood pressure medications on hold due to hypotension.  * Back spasm: Flexeril PRN  * tobacco dependence: Counseled by admitting physician Start nicotine patch  Management plans discussed with the patient and she is in agreement.  CODE STATUS: FULL  CRITICAL CARE TOTAL TIME TAKING CARE OF THIS PATIENT: 35 minutes.    Hillary Bow R M.D on 01/05/2016 at 9:10 AM  Between 7am to 6pm - Pager - [phone]  After 6pm go to [email or web address] - password [email or web address]  Office  [phone]  CC: Primary care physician; No PCP Per Patient  Note: This dictation was prepared with Dragon dictation along with smaller phrase technology. Any transcriptional errors that result from this process are unintentional.

## 2016-01-05 NOTE — Clinical Documentation Improvement (Addendum)
Internal Medicine at [facility]  Please document query responses in the progress notes and discharge summary. Please do not document query responses on the CDI BPA form in [facility]. Please do not deactivate queries without addressing them.             Thank you.  Please document if a condition below provides greater specificity regarding "worsening LFTs":  - Shock Liver, including any baseline or comparative data, and any associated causes and/or conditions  - Acute Hepatic Failure, including any baseline or comparative data, and any associated causes and/or conditions  - Other condition  - Unable to clinically determine  Clinical Information: "Worsening LFTs, renal dysfunction on lab work, suspect secondary to sepsis and ATN" is documented in the progress note by [name] 01/05/16 at 2:34 pm. Documented septic shock this admission with systolic BP's in the [id number] LFT's this admission: Component     Latest Ref Rng 01/04/2016         8:20 PM  AST     15 - 41 U/L 131 (H)  ALT     17 - 63 U/L 96 (H)  Alkaline Phosphatase     38 - 126 U/L 56  Total Bilirubin     0.3 - 1.2 mg/dL 4.2 (H)    Please exercise your independent, professional judgment when responding. A specific answer is not anticipated or expected.   Thank You, Erling Conte  RN BSN CCDS [phone] Health Information Management [place]

## 2016-01-05 NOTE — Progress Notes (Signed)
Patient: Gabriel [name]. / Admit Date: 01/03/2016 / Date of Encounter: 01/05/2016, 7:22 AM   Subjective: Transferred to the ICU in the afternoon on 5/18 given hypotension in the setting of septic shock. Echo showed worsened EF to 20-25%, unable to exclude RWMA, LV diastolic function was normal, mild MR, LA moderately dilated, mild to moderate TR, PASP normal. Troponin peaked at 1.05 then back down to 0.13-->0.22-->0.53. Felt to be supply demand ischemia in the setting of the patient's PNA and systolic CHF. Blood pressure is improved this morning with a MAP of 94 currently. Not on pressors. WBC remains elevated at 18.8. Procalcitonin elevated. ABG showed pO2 of 62. Feels much better this morning. Breathing much better. On room air.   Review of Systems: Review of Systems  Constitutional: Positive for fever and malaise/fatigue. Negative for chills, weight loss and diaphoresis.  HENT: Negative for congestion.   Eyes: Negative for discharge and redness.  Respiratory: Positive for cough, shortness of breath and wheezing. Negative for hemoptysis and sputum production.   Cardiovascular: Negative for chest pain, palpitations, orthopnea, claudication, leg swelling and PND.  Gastrointestinal: Negative for heartburn, nausea, vomiting and abdominal pain.  Musculoskeletal: Negative for falls.  Skin: Negative for rash.  Neurological: Positive for weakness. Negative for dizziness, sensory change, speech change and focal weakness.  Endo/Heme/Allergies: Does not bruise/bleed easily.  Psychiatric/Behavioral: The patient is not nervous/anxious.   All other systems reviewed and are negative.    Objective: Telemetry: NSR, 60's to 80's bpm Physical Exam: Blood pressure 94/66, pulse 69, temperature 98.1 F (36.7 C), temperature source Oral, resp. rate 20, height 6\' 1"  (1.854 m), weight 251 lb 8.7 oz (114.1 kg), SpO2 96 %. Body mass index is 33.19 kg/(m^2). General: Well developed, well nourished, in no  acute distress. Head: Normocephalic, atraumatic, sclera non-icteric, no xanthomas, nares are without discharge. Neck: Negative for carotid bruits. JVP not elevated. Lungs: Improved breath sounds bilaterally, rhonchi right upper lobe. Breathing is unlabored. Heart: RRR S1 S2 without murmurs, rubs, or gallops.  Abdomen: Soft, non-tender, non-distended with normoactive bowel sounds. No rebound/guarding. Extremities: No clubbing or cyanosis. No edema. Distal pedal pulses are 2+ and equal bilaterally. Neuro: Alert and oriented X 3. Moves all extremities spontaneously. Psych:  Responds to questions appropriately with a normal affect.   Intake/Output Summary (Last 24 hours) at 01/05/16 0722 Last data filed at 01/05/16 0647  Gross per 24 hour  Intake 2176.25 ml  Output   1100 ml  Net 1076.25 ml    Inpatient Medications:  . aspirin EC  81 mg Oral Daily  . budesonide (PULMICORT) nebulizer solution  0.25 mg Nebulization BID  . cefTRIAXone (ROCEPHIN)  IV  2 g Intravenous Q12H  . enoxaparin (LOVENOX) injection  40 mg Subcutaneous Q24H  . ipratropium-albuterol  3 mL Nebulization Q6H  . nicotine  21 mg Transdermal Daily  . piperacillin-tazobactam (ZOSYN)  IV  3.375 g Intravenous Q8H  . pneumococcal 23 valent vaccine  0.5 mL Intramuscular Tomorrow-1000  . simvastatin  40 mg Oral QPM  . sodium chloride flush  3 mL Intravenous Q12H  . vancomycin  1,250 mg Intravenous Q8H   Infusions:  . sodium chloride 75 mL/hr at 01/04/16 2000    Labs:  Recent Labs  01/04/16 0418 01/04/16 2020  NA 134* 132*  K 3.7 4.1  CL 101 101  CO2 24 23  GLUCOSE 127* 114*  BUN 28* 46*  CREATININE 1.19 2.41*  CALCIUM 7.2* 7.4*    Recent Labs  01/04/16 2020  AST 131*  ALT 96*  ALKPHOS 56  BILITOT 4.2*  PROT 6.0*  ALBUMIN 2.7*    Recent Labs  01/04/16 0418 01/05/16 0613  WBC 18.0* 18.8*  HGB 13.7 12.9*  HCT 42.0 39.1*  MCV 83.2 82.8  PLT 100* 140*    Recent Labs  01/04/16 0418  01/04/16 1009 01/04/16 1401 01/04/16 2020  TROPONINI 1.05* 0.13* 0.22* 0.53*   Invalid input(s): POCBNP  Recent Labs  01/04/16 1401  HGBA1C 5.6     Weights: Filed Weights   01/03/16 1202 01/03/16 1559 01/04/16 1700  Weight: 225 lb (102.059 kg) 241 lb (109.317 kg) 251 lb 8.7 oz (114.1 kg)     Radiology/Studies:  Dg Chest 1 View  01/04/2016  CLINICAL DATA:  Central line placement. EXAM: CHEST 1 VIEW COMPARISON:  Chest x-rays dated 01/03/2016, 12/14/2015 and 03/20/2015. FINDINGS: The right upper lobe consolidation is increasingly dense on today's exam, again compatible with pneumonia. There is a stable mild prominence of the interstitial markings within each lung. Right IJ central line has been placed with tip well positioned at the level of the lower SVC. No pneumothorax. IMPRESSION: 1. Right IJ central line placement. Tip of the line appears well positioned at the level of the lower SVC. No pneumothorax or other complicating feature identified. 2. Right upper lobe pneumonia, increased in density compared to yesterday's chest x-ray. Electronically Signed   By: Franki Cabot M.D.   On: 01/04/2016 17:09   Dg Chest 2 View  01/03/2016  CLINICAL DATA:  Patient states that he has had worsening cough over past 2 weeks which has worsened today. Patient now has right side chest pain, SOB, diarrhea, and nausea. EXAM: CHEST  2 VIEW COMPARISON:  None. FINDINGS: There is right upper lobe consolidation. There is bilateral chronic interstitial lung disease. There is no pleural effusion or pneumothorax. The heart and mediastinal contours are unremarkable. The osseous structures are unremarkable. IMPRESSION: Right upper lobe pneumonia. Followup PA and lateral chest X-ray is recommended in 3-4 weeks following trial of antibiotic therapy to ensure resolution and exclude underlying malignancy. Electronically Signed   By: Kathreen Devoid   On: 01/03/2016 12:52   Dg Chest 2 View  12/14/2015  CLINICAL DATA:  Cough,  shortness of breath. EXAM: CHEST  2 VIEW COMPARISON:  November 24, 2015. FINDINGS: Stable cardiomediastinal silhouette. No pneumothorax or pleural effusion is noted. Mildly increased interstitial densities are noted in both lung bases with possible Kerley B-lines seen on the right concerning for minimal to mild pulmonary edema. Bony thorax is unremarkable. IMPRESSION: Mildly increased bibasilar interstitial densities are noted concerning for possible pulmonary edema. Electronically Signed   By: Marijo Conception, M.D.   On: 12/14/2015 11:02     Assessment and Plan   1. Elevated troponin: -Initial troponin trend was mild and flat. At 4:18 AM patient's troponin trended up from 0.18-->1.05. He was asymptomatic besides pleuritic chest pain. His troponin has since down trended back to 0.13 at 10:09 AM. Followed by 0.22-->0.62.  -Never with chest pain -Felt to be supply demand ischemia in the setting of the patient's PNA and systolic CHF  -He will require right and left heart catheterization as infection resolves, perhaps first of the wee? Will discuss with MD regarding timing -He is still at significant risk for ACS given his longstanding tobacco abuse, history of diabetes, HTN, HLD, and obesity -Echo as above with worsened EF -Given the downward trend of his troponin, and asymptomatic presentation from a  cardiac standpoint, will not start heparin gtt at this time -He has also had some blood tinged sputum, though this appears to be stable currently -Hold patient's Coreg, lisinopril, and hydralazine at this time given his hypotension with current blood systolic blood pressure in the 90's, though earlier today in the [id number] systolic -Start HF medications as blood pressure allows   2. Septic shock in the setting of right upper lobe pneumonia: -Improved -Blood pressure has improved some s/p IV fluids -Rapid response was called earlier this AM s/p Coreg and lisinopril when the patient got dizzy while trying to  have a BM. This was likely neurocardiogenic in etiology -Currently with stable MAP in the mid 90's -If BP drops again requiring the use of pressors he may need inotropic support, short-term -On IV Rocephin and azithromycin per IM -On nebs per IM -Consider steroids -Recommend incentive spirometry   3. Chronic systolic CHF: -He does not appear to be volume overloaded at this time -Coreg and lisinopril are on hold as above given his septic shock -Echo as above -CHF education needed prior to discharge   4. COPD/ongoing tobacco abuse: -As above -Per IM  5. HTN: -Hypotensive currently per #2 -Restart meds as able  6. HLD: -Simvastatin  -LDL 46  7. DM2: -A1C 5.6% -Per IM   Signed, Christell Faith, PA-C Pager: [phone] 01/05/2016, 7:22 AM  Attending Note Patient seen and examined, agree with detailed note above,  Patient presentation and plan discussed on rounds.   Echocardiogram with ejection fraction 20-25% Prior CT scan several months ago with no significant coronary calcifications, Likely nonischemic cardiomyopathy  Worsening LFTs, renal dysfunction on lab work, suspect secondary to sepsis and ATN  No cardiac intervention needed at this time He has indicated his "skin is tight" as commonly happens when he has excess fluid/CHF. He does not have IV fluids going except for antibiotics. Would hold off on Lasix unless very symptomatic given worsening renal function.  Given the lab work as detailed above, will likely not be in any shape for catheterization anytime soon.   Greater than 50% was spent in counseling and coordination of care with patient Total encounter time 25 minutes or more   Signed: Esmond Plants  M.D., Ph.D. [facility]

## 2016-01-05 NOTE — Progress Notes (Signed)
Patient c/o burning and hesitancy with urination. Dr. Darvin Neighbours notified and gave order for flomax daily. Gabriel [name]

## 2016-01-05 NOTE — Progress Notes (Signed)
Pharmacy Antibiotic Note  Gabriel [name]. is a 45 y.o. male admitted on 01/03/2016 with bacteremia.  Pharmacy has been consulted for Rocephin dosing.  Plan: Will continue Ceftriaxone 2 gm IV q 24 - if clinically improving would rec a duration of 7 days per MD Ola Spurr. If ready for dc prior to completion of 7 day course could be given IM at home. MD placed orders to check HIV and CH50 to screen for complement deficiency  Height: 6\' 1"  (185.4 cm) Weight: 251 lb 8.7 oz (114.1 kg) IBW/kg (Calculated) : 79.9  Temp (24hrs), Avg:97.9 F (36.6 C), Min:97.1 F (36.2 C), Max:98.4 F (36.9 C)   Recent Labs Lab 01/03/16 1218 01/03/16 1345 01/03/16 1621 01/04/16 0418 01/04/16 2020 01/05/16 0613  WBC 28.2*  --   --  18.0*  --  18.8*  CREATININE 0.94  --   --  1.19 2.41*  --   LATICACIDVEN  --  2.0 3.6*  --   --  1.3    Estimated Creatinine Clearance: 50.7 mL/min (by C-G formula based on Cr of 2.41).    Allergies  Allergen Reactions  . Prednisone Other (See Comments)    Reaction: Hallucinations     Antimicrobials this admission: Vancomycin  5/18 >> 5/18 Zosyn  5/18 >> 5/18 Ceftriaxone 5/18 >>>  Dose adjustments this admission: Ceftriaxone 2 g IV q24 hours  Microbiology results:  BCx: Neisseria meningitidis   UCx:    Sputum:    MRSA PCR:   Thank you for allowing pharmacy to be a part of this patient's care.  Eva Griffo D 01/05/2016 1:51 PM

## 2016-01-05 NOTE — Progress Notes (Signed)
Please keep patient on droplet precautions until advised otherwise by Infection Prevention or Infectious Disease MD.  Hubert Azure, MSN, RN, CIC

## 2016-01-06 ENCOUNTER — Inpatient Hospital Stay: Payer: Commercial Managed Care - HMO

## 2016-01-06 LAB — COMPREHENSIVE METABOLIC PANEL WITH GFR
ALT: 178 U/L — ABNORMAL HIGH (ref 17–63)
AST: 162 U/L — ABNORMAL HIGH (ref 15–41)
Albumin: 2.7 g/dL — ABNORMAL LOW (ref 3.5–5.0)
Alkaline Phosphatase: 46 U/L (ref 38–126)
Anion gap: 10 (ref 5–15)
BUN: 67 mg/dL — ABNORMAL HIGH (ref 6–20)
CO2: 22 mmol/L (ref 22–32)
Calcium: 8.1 mg/dL — ABNORMAL LOW (ref 8.9–10.3)
Chloride: 102 mmol/L (ref 101–111)
Creatinine, Ser: 3.17 mg/dL — ABNORMAL HIGH (ref 0.61–1.24)
GFR calc Af Amer: 25 mL/min — ABNORMAL LOW
GFR calc non Af Amer: 22 mL/min — ABNORMAL LOW
Glucose, Bld: 89 mg/dL (ref 65–99)
Potassium: 4.2 mmol/L (ref 3.5–5.1)
Sodium: 134 mmol/L — ABNORMAL LOW (ref 135–145)
Total Bilirubin: 2.8 mg/dL — ABNORMAL HIGH (ref 0.3–1.2)
Total Protein: 6 g/dL — ABNORMAL LOW (ref 6.5–8.1)

## 2016-01-06 LAB — CBC WITH DIFFERENTIAL/PLATELET
Basophils Absolute: 0.1 K/uL (ref 0–0.1)
Basophils Relative: 1 %
Eosinophils Absolute: 0.1 K/uL (ref 0–0.7)
Eosinophils Relative: 1 %
HCT: 40.7 % (ref 40.0–52.0)
Hemoglobin: 13.3 g/dL (ref 13.0–18.0)
Lymphocytes Relative: 9 %
Lymphs Abs: 1.5 K/uL (ref 1.0–3.6)
MCH: 27.3 pg (ref 26.0–34.0)
MCHC: 32.7 g/dL (ref 32.0–36.0)
MCV: 83.4 fL (ref 80.0–100.0)
Monocytes Absolute: 1.2 K/uL — ABNORMAL HIGH (ref 0.2–1.0)
Monocytes Relative: 7 %
Neutro Abs: 13.3 K/uL — ABNORMAL HIGH (ref 1.4–6.5)
Neutrophils Relative %: 82 %
Platelets: 155 K/uL (ref 150–440)
RBC: 4.88 MIL/uL (ref 4.40–5.90)
RDW: 15.1 % — ABNORMAL HIGH (ref 11.5–14.5)
WBC: 16.2 K/uL — ABNORMAL HIGH (ref 3.8–10.6)

## 2016-01-06 LAB — HEPATITIS PANEL, ACUTE
HCV Ab: <0.1 s/co ratio (ref 0.0–0.9)
Hep A IgM: NEGATIVE
Hep B C IgM: NEGATIVE
Hepatitis B Surface Ag: NEGATIVE

## 2016-01-06 LAB — PROCALCITONIN: Procalcitonin: 48.35 ng/mL

## 2016-01-06 NOTE — Progress Notes (Addendum)
[facility] NAME: Gabriel [name]    MR#:  [id number]  DATE OF BIRTH:  1971-02-12  SUBJECTIVE:  Weakness.   REVIEW OF SYSTEMS:    Review of Systems  Constitutional: Negative for fever, chills, but has Generalized weakness. HENT: Negative for ear discharge, ear pain, hearing loss, nosebleeds and sore throat.   Eyes: Negative for blurred vision and pain.  Respiratory: Negative for cough, hemoptysis and wheezing. No Shortness of breath..   Cardiovascular: Negative for palpitations and leg swelling. No Chest pain.  Gastrointestinal: Negative for nausea, vomiting, abdominal pain, diarrhea and blood in stool.  Genitourinary: Negative for dysuria.  Musculoskeletal: No back pain.  Neurological: Negative for dizziness, tremors, speech change, focal weakness, seizures and headaches.  Endo/Heme/Allergies: Does not bruise/bleed easily.  Psychiatric/Behavioral: Negative for depression, suicidal ideas and hallucinations.   Tolerating Diet:yes  DRUG ALLERGIES:   Allergies  Allergen Reactions  . Prednisone Other (See Comments)    Reaction: Hallucinations     VITALS:  Blood pressure 127/84, pulse 72, temperature 98.5 F (36.9 C), temperature source Oral, resp. rate 20, height 6\' 1"  (1.854 m), weight 114.1 kg (251 lb 8.7 oz), SpO2 95 %.  PHYSICAL EXAMINATION:   Physical Exam  Constitutional: He is oriented to person, place, and time. He appears distressed.  HENT:  Head: Normocephalic.  Eyes: No scleral icterus.  Neck: Normal range of motion. Neck supple. No JVD present. No tracheal deviation present.  Cardiovascular: Normal rate, regular rhythm and normal heart sounds.  Exam reveals no gallop and no friction rub.   No murmur heard. Pulmonary/Chest: Effort normal. No respiratory distress. He has no wheezes or rales.  He exhibits no tenderness.  Abdominal: Soft. Bowel sounds are normal. He exhibits no distension and no mass. There is no  tenderness. There is no rebound and no guarding.  Musculoskeletal: Normal range of motion. He exhibits no edema.  Neurological: He is alert and oriented to person, place, and time.  Skin: Skin is warm. No rash noted. No erythema.  Psychiatric: Affect and judgment normal.   LABORATORY PANEL:   CBC  Recent Labs Lab 01/06/16 0450  WBC 16.2*  HGB 13.3  HCT 40.7  PLT 155   ------------------------------------------------------------------------------------------------------------------  Chemistries   Recent Labs Lab 01/06/16 0450  NA 134*  K 4.2  CL 102  CO2 22  GLUCOSE 89  BUN 67*  CREATININE 3.17*  CALCIUM 8.1*  AST 162*  ALT 178*  ALKPHOS 46  BILITOT 2.8*   ------------------------------------------------------------------------------------------------------------------  Cardiac Enzymes  Recent Labs Lab 01/04/16 1009 01/04/16 1401 01/04/16 2020  TROPONINI 0.13* 0.22* 0.53*   ------------------------------------------------------------------------------------------------------------------  RADIOLOGY:  Dg Chest 1 View  01/04/2016  CLINICAL DATA:  Central line placement. EXAM: CHEST 1 VIEW COMPARISON:  Chest x-rays dated 01/03/2016, 12/14/2015 and 03/20/2015. FINDINGS: The right upper lobe consolidation is increasingly dense on today's exam, again compatible with pneumonia. There is a stable mild prominence of the interstitial markings within each lung. Right IJ central line has been placed with tip well positioned at the level of the lower SVC. No pneumothorax. IMPRESSION: 1. Right IJ central line placement. Tip of the line appears well positioned at the level of the lower SVC. No pneumothorax or other complicating feature identified. 2. Right upper lobe pneumonia, increased in density compared to yesterday's chest x-ray. Electronically Signed   By: Franki Cabot M.D.   On: 01/04/2016 17:09   US Abdomen Limited Ruq  01/06/2016  CLINICAL DATA:  Jaundice. EXAM: US  ABDOMEN LIMITED - RIGHT UPPER QUADRANT COMPARISON:  Multiple exams, including 11/19/2012 FINDINGS: Gallbladder: Gallbladder wall thickening at 5 mm. Small amount of pericholecystic fluid and perihepatic fluid. The dependent non shadowing filling defect in the gallbladder compatible with sludge. No visualized gallstones. Common bile duct: Diameter: 5 mm.  No directly visualized choledocholithiasis. Liver: No focal lesion identified. Coarse echogenic liver with poor sonic penetration compatible with diffuse hepatic steatosis. Incidentally, the right kidney appears to be slightly more echogenic than the liver. IMPRESSION: 1. Gallbladder wall thickening with pericholecystic fluid. Sludge in the gallbladder without sonographic Murphy sign or gallstones. Correlate clinically in assessing for acute cholecystitis. 2. Small amount of perihepatic ascites. 3. Coarse echogenic liver with poor sonic penetration compatible with diffuse hepatic steatosis. 4. Echogenic right kidney, nonspecific. Correlate with renal function is in assessing for glomerulonephritis or other potential causes. Electronically Signed   By: Van Clines M.D.   On: 01/06/2016 08:51     ASSESSMENT AND PLAN:   45 year old male with nonischemic cardiomyopathy EF of 45-50% in 2016, COPD with ongoing tobacco abuse and obesity who presented with sepsis in the setting of right upper lobe pneumonia.  * Neisseria meningitidis bacteremia due to right upper lobe pneumonia with severe sepsis On ceftriaxone 2 g every 12 hours. No signs of meningitis. No purpura. discontinued droplet isolation per Dr. Ola Spurr Follow-up CBC.  * Acute renal failure due to ATN from sepsis/pneumonia and hypotension.. Patient has ejection fraction 20-25%. Need to be very cautious with fluids. holding outpatient lisinopril and potassium. Follow up BMP.  * Hyperbilirubinemia. Better. This seems chronic.   * Liver function tests due to shock liver from  sepsis. Follow-up hepatitis panel and CMP. Ultrasound right upper quadrant.  * Elevated troponin: Troponin peaked at 1.05 and now is trended down. This is likely due to demand ischemia from problem #1 [place] cardiology consult. At some point patient may need cardiac catheterization as per CARDIOLOGY consult. Continue ASA, statin  * Chronic systolic CHF, Echo showed EF 20-25% History of mild pulmonary edema on chest x-ray. He also has sepsis and acute renal failure. stopped IV fluids. Lasix on hold Start Lasix if CHF is worsening.  * History of essential hypertension: All blood pressure medications on hold due to hypotension.  * Back spasm: Flexeril PRN  * tobacco dependence: Counseled by admitting physician On nicotine patch  Weakness: PT.    I discussed with Dr. Juleen China. Management plans discussed with the patient, his wife and father, and they are in agreement.  CODE STATUS: FULL  CRITICAL CARE TOTAL TIME TAKING CARE OF THIS PATIENT: 43 minutes.    Demetrios Loll M.D on 01/06/2016 at 1:37 PM  Between 7am to 6pm - Pager - [phone]  After 6pm go to [email or web address] - password [email or web address] [facility]  Office  [phone]  CC: Primary care physician; No PCP Per Patient  Note: This dictation was prepared with Dragon dictation along with smaller phrase technology. Any transcriptional errors that result from this process are unintentional.

## 2016-01-06 NOTE — Progress Notes (Signed)
Patient request for central line to be taken out. Spoke with [name] and gave verbal order for it to be d/c'd since patient also has peripheral line. Gabriel [name]

## 2016-01-06 NOTE — Progress Notes (Signed)
[facility]  CONSULT NOTE    Date: 01/06/2016                  Patient Name:  Gabriel [name].  MRN: [id number]  DOB: [birth year]  Age / Sex: 45 y.o., male         PCP: No PCP Per Patient                 Service Requesting Consult: [name]                 Reason for Consult: Acute Renal Failure            History of Present Illness: Gabriel [name]. is a 45 y.o. white male with COPD/tobacco use, systolic congestive heart failure, hypertension, hyperlipidemia, type II diabetes mellitus type II, psoariasis, who was admitted to [facility] on 01/03/2016 for CAP (community acquired pneumonia) [J18.9] Sepsis, due to unspecified organism [facility]) [A41.9]  Patient was found to have Neisseria meningitidis growing in his blood culture. Hypotensive on admission. Found to have pneumonia. No IV contrast. Empiric antibiotics of azithromycin, ceftriaxone and vancomycin.   Given IV fluids. Patient denies history of kidney disease. He is quite upset about the diagnosis of acute renal failure. Girlfriend at bedside.    Medications: Outpatient medications: Prescriptions prior to admission  Medication Sig Dispense Refill Last Dose  . albuterol (PROVENTIL HFA;VENTOLIN HFA) 108 (90 Base) MCG/ACT inhaler Inhale 2 puffs into the lungs every 6 (six) hours as needed for wheezing or shortness of breath. 1 Inhaler 0   . aspirin EC 81 MG tablet Take 1 tablet (81 mg total) by mouth daily. 30 tablet 0   . carvedilol (COREG) 12.5 MG tablet Take 1 tablet (12.5 mg total) by mouth 2 (two) times daily with a meal. 60 tablet 0   . furosemide (LASIX) 40 MG tablet Take 1 tablet (40 mg total) by mouth daily. 30 tablet 0   . lisinopril (PRINIVIL,ZESTRIL) 20 MG tablet Take 2 tablets (40 mg total) by mouth daily. 60 tablet 0   . potassium chloride (K-DUR) 10 MEQ tablet Take 2 tablets (20 mEq total) by mouth daily. 60 tablet 0   . simvastatin (ZOCOR) 40 MG tablet Take 1 tablet (40 mg total) by mouth  every evening. 30 tablet 0     Current medications: Current Facility-Administered Medications  Medication Dose Route Frequency Provider Last Rate Last Dose  . acetaminophen (TYLENOL) tablet 650 mg  650 mg Oral Q6H PRN Lytle Butte, MD   650 mg at 01/06/16 1221   Or  . acetaminophen (TYLENOL) suppository 650 mg  650 mg Rectal Q6H PRN Lytle Butte, MD      . aspirin EC tablet 81 mg  81 mg Oral Daily Lytle Butte, MD   81 mg at 01/06/16 0945  . budesonide (PULMICORT) nebulizer solution 0.25 mg  0.25 mg Nebulization BID Bincy S Varughese, NP   0.25 mg at 01/06/16 0826  . cefTRIAXone (ROCEPHIN) 2 g in dextrose 5 % 50 mL IVPB  2 g Intravenous Q24H Leonel Ramsay, MD   2 g at 01/06/16 [phone]  . cyclobenzaprine (FLEXERIL) tablet 5 mg  5 mg Oral TID PRN Bettey Costa, MD   5 mg at 01/05/16 1419  . guaiFENesin-codeine 100-10 MG/5ML solution 10 mL  10 mL Oral Q4H PRN Lytle Butte, MD   10 mL at 01/05/16 2112  . heparin injection 5,000 Units  5,000 Units  Subcutaneous Q8H Wilhelmina Mcardle, MD   5,000 Units at 01/05/16 2100  . HYDROmorphone (DILAUDID) injection 1 mg  1 mg Intravenous Q4H PRN Vishal Mungal, MD   1 mg at 01/05/16 1146  . ipratropium-albuterol (DUONEB) 0.5-2.5 (3) MG/3ML nebulizer solution 3 mL  3 mL Nebulization Q6H Bincy S Varughese, NP   3 mL at 01/06/16 0826  . ipratropium-albuterol (DUONEB) 0.5-2.5 (3) MG/3ML nebulizer solution 3 mL  3 mL Nebulization Q4H PRN Bincy S Varughese, NP      . morphine 2 MG/ML injection 2 mg  2 mg Intravenous Q4H PRN Lytle Butte, MD   2 mg at 01/04/16 0857  . nicotine (NICODERM CQ - dosed in mg/24 hours) patch 21 mg  21 mg Transdermal Daily Bettey Costa, MD   21 mg at 01/04/16 1230  . ondansetron (ZOFRAN) tablet 4 mg  4 mg Oral Q6H PRN Lytle Butte, MD       Or  . ondansetron [facility]) injection 4 mg  4 mg Intravenous Q6H PRN Lytle Butte, MD   4 mg at 01/04/16 1806  . oxyCODONE (Oxy IR/ROXICODONE) immediate release tablet 5 mg  5 mg Oral Q4H PRN Lytle Butte, MD   5 mg at 01/04/16 0740  . simvastatin (ZOCOR) tablet 40 mg  40 mg Oral QPM Lytle Butte, MD   40 mg at 01/05/16 1737  . sodium chloride flush (NS) 0.9 % injection 3 mL  3 mL Intravenous Q12H Lytle Butte, MD   3 mL at 01/06/16 0945  . tamsulosin (FLOMAX) capsule 0.4 mg  0.4 mg Oral Daily Hillary Bow, MD   0.4 mg at 01/06/16 0945      Allergies: Allergies  Allergen Reactions  . Prednisone Other (See Comments)    Reaction: Hallucinations       Past Medical History: Past Medical History  Diagnosis Date  . Hypertension     Resolved since weight loss  . Obesity (BMI 30.0-34.9)   . Psoriasis   . Diabetes mellitus without complication ([place])     Pre diabetic  . CHF (congestive heart failure) ([place])   . COPD (chronic obstructive pulmonary disease) ([place])      Past Surgical History: Past Surgical History  Procedure Laterality Date  . Amputation    . Finger amputation      Traumatic     Family History: Family History  Problem Relation Age of Onset  . Diabetes Mellitus II Mother   . Hypertension Father   . Cancer Paternal Aunt   . Cancer Maternal Grandfather      Social History: Social History   Social History  . Marital Status: Divorced    Spouse Name: N/A  . Number of Children: N/A  . Years of Education: N/A   Occupational History  . Not on file.   Social History Main Topics  . Smoking status: Current Every Day Smoker -- 0.25 packs/day for 33 years    Types: Cigarettes  . Smokeless tobacco: Never Used  . Alcohol Use: 1.8 oz/week    3 Cans of beer, 0 Standard drinks or equivalent per week     Comment: occassionally  . Drug Use: No  . Sexual Activity: Not on file   Other Topics Concern  . Not on file   Social History Narrative     Review of Systems: Review of Systems  Constitutional: Negative.  Negative for fever, chills, weight loss, malaise/fatigue and diaphoresis.  HENT: Negative.  Negative for  congestion, ear discharge, ear pain,  hearing loss, nosebleeds, sore throat and tinnitus.   Eyes: Negative.  Negative for blurred vision, double vision, photophobia, pain, discharge and redness.  Respiratory: Positive for cough, sputum production, shortness of breath and wheezing. Negative for hemoptysis and stridor.   Cardiovascular: Negative.  Negative for chest pain, palpitations, orthopnea, claudication, leg swelling and PND.  Gastrointestinal: Positive for diarrhea. Negative for heartburn, nausea, vomiting, abdominal pain, constipation, blood in stool and melena.  Genitourinary: Negative.  Negative for dysuria, urgency, frequency, hematuria and flank pain.  Musculoskeletal: Negative.  Negative for myalgias, back pain, joint pain, falls and neck pain.  Skin: Negative.  Negative for itching and rash.  Neurological: Negative.  Negative for dizziness, tingling, tremors, sensory change, speech change, focal weakness, seizures, loss of consciousness, weakness and headaches.  Endo/Heme/Allergies: Negative.  Negative for environmental allergies and polydipsia. Does not bruise/bleed easily.  Psychiatric/Behavioral: Positive for depression. Negative for suicidal ideas, hallucinations, memory loss and substance abuse. The patient is nervous/anxious. The patient does not have insomnia.     Vital Signs: Blood pressure 127/84, pulse 72, temperature 98.5 F (36.9 C), temperature source Oral, resp. rate 20, height 6\' 1"  (1.854 m), weight 114.1 kg (251 lb 8.7 oz), SpO2 95 %.  Weight trends: Filed Weights   01/03/16 1202 01/03/16 1559 01/04/16 1700  Weight: 102.059 kg (225 lb) 109.317 kg (241 lb) 114.1 kg (251 lb 8.7 oz)    Physical Exam: General: NAD, laying in bed  Head: Normocephalic, atraumatic. Moist oral mucosal membranes  Eyes: Anicteric, PERRL  Neck: Supple, trachea midline  Lungs:  Coarse breath sounds bilaterally  Heart: Regular rate and rhythm  Abdomen:  Soft, nontender, obese  Extremities: trace peripheral edema.   Neurologic: Nonfocal, moving all four extremities  Skin: No lesions        Lab results: Basic Metabolic Panel:  Recent Labs Lab 01/04/16 0418 01/04/16 2020 01/06/16 0450  NA 134* 132* 134*  K 3.7 4.1 4.2  CL 101 101 102  CO2 24 23 22   GLUCOSE 127* 114* 89  BUN 28* 46* 67*  CREATININE 1.19 2.41* 3.17*  CALCIUM 7.2* 7.4* 8.1*    Liver Function Tests:  Recent Labs Lab 01/04/16 2020 01/06/16 0450  AST 131* 162*  ALT 96* 178*  ALKPHOS 56 46  BILITOT 4.2* 2.8*  PROT 6.0* 6.0*  ALBUMIN 2.7* 2.7*   No results for input(s): LIPASE, AMYLASE in the last 168 hours. No results for input(s): AMMONIA in the last 168 hours.  CBC:  Recent Labs Lab 01/03/16 1218 01/04/16 0418 01/05/16 0613 01/06/16 0450  WBC 28.2* 18.0* 18.8* 16.2*  NEUTROABS  --   --   --  13.3*  HGB 15.1 13.7 12.9* 13.3  HCT 44.9 42.0 39.1* 40.7  MCV 81.1 83.2 82.8 83.4  PLT 163 100* 140* 155    Cardiac Enzymes:  Recent Labs Lab 01/03/16 2132 01/04/16 0418 01/04/16 1009 01/04/16 1401 01/04/16 2020  TROPONINI 0.18* 1.05* 0.13* 0.22* 0.53*    BNP: Invalid input(s): POCBNP  CBG:  Recent Labs Lab 01/04/16 1028 01/04/16 1614  GLUCAP 213* 140*    Microbiology: Results for orders placed or performed during the hospital encounter of 01/03/16  Urine culture     Status: None   Collection Time: 01/03/16  1:32 PM  Result Value Ref Range Status   Specimen Description URINE, RANDOM  Final   Special Requests NONE  Final   Culture NO GROWTH Performed at [facility]   Final  Report Status 01/04/2016 FINAL  Final  Blood Culture (routine x 2)     Status: None (Preliminary result)   Collection Time: 01/03/16  1:45 PM  Result Value Ref Range Status   Specimen Description BLOOD RIGHT ARM  Final   Special Requests BOTTLES DRAWN AEROBIC AND ANAEROBIC [id number]  Final   Culture NO GROWTH 2 DAYS  Final   Report Status PENDING  Incomplete  Blood Culture (routine x 2)     Status:  Abnormal (Preliminary result)   Collection Time: 01/03/16  1:45 PM  Result Value Ref Range Status   Specimen Description BLOOD LEFT ASSIST CONTROL  Final   Special Requests BOTTLES DRAWN AEROBIC AND ANAEROBIC [place]  Final   Culture  Setup Time (A)  Final    GRAM NEGATIVE DIPLOCOCCI AEROBIC BOTTLE ONLY CRITICAL RESULT CALLED TO, READ BACK BY AND VERIFIED WITH: MELISSA Guernsey AT 1938 01/04/16 MLZ  CONFIRMED BY MSS    Culture NEISSERIA MENINGITIDIS BETA LACTAMASE NEGATIVE  (A)  Final   Report Status PENDING  Incomplete  Blood Culture ID Panel (Reflexed)     Status: Abnormal   Collection Time: 01/03/16  1:45 PM  Result Value Ref Range Status   Enterococcus species NOT DETECTED NOT DETECTED Final   Vancomycin resistance NOT DETECTED NOT DETECTED Final   Listeria monocytogenes NOT DETECTED NOT DETECTED Final   Staphylococcus species NOT DETECTED NOT DETECTED Final   Staphylococcus aureus NOT DETECTED NOT DETECTED Final   Methicillin resistance NOT DETECTED NOT DETECTED Final   Streptococcus species NOT DETECTED NOT DETECTED Final   Streptococcus agalactiae NOT DETECTED NOT DETECTED Final   Streptococcus pneumoniae NOT DETECTED NOT DETECTED Final   Streptococcus pyogenes NOT DETECTED NOT DETECTED Final   Acinetobacter baumannii NOT DETECTED NOT DETECTED Final   Enterobacteriaceae species NOT DETECTED NOT DETECTED Final   Enterobacter cloacae complex NOT DETECTED NOT DETECTED Final   Escherichia coli NOT DETECTED NOT DETECTED Final   Klebsiella oxytoca NOT DETECTED NOT DETECTED Final   Klebsiella pneumoniae NOT DETECTED NOT DETECTED Final   Proteus species NOT DETECTED NOT DETECTED Final   Serratia marcescens NOT DETECTED NOT DETECTED Final   Carbapenem resistance NOT DETECTED NOT DETECTED Final   Haemophilus influenzae NOT DETECTED NOT DETECTED Final   Neisseria meningitidis DETECTED (A) NOT DETECTED Final    Comment: CALLED TO MELISSA MACCIA AT 1938 01/04/16 MLZ     Pseudomonas aeruginosa NOT DETECTED NOT DETECTED Final   Candida albicans NOT DETECTED NOT DETECTED Final   Candida glabrata NOT DETECTED NOT DETECTED Final   Candida krusei NOT DETECTED NOT DETECTED Final   Candida parapsilosis NOT DETECTED NOT DETECTED Final   Candida tropicalis NOT DETECTED NOT DETECTED Final  MRSA PCR Screening     Status: None   Collection Time: 01/04/16  4:00 PM  Result Value Ref Range Status   MRSA by PCR NEGATIVE NEGATIVE Final    Comment:        The GeneXpert MRSA Assay (FDA approved for NASAL specimens only), is one component of a comprehensive MRSA colonization surveillance program. It is not intended to diagnose MRSA infection nor to guide or monitor treatment for MRSA infections.   Culture, expectorated sputum-assessment     Status: None   Collection Time: 01/05/16  9:05 AM  Result Value Ref Range Status   Specimen Description EXPECTORATED SPUTUM  Final   Special Requests NONE  Final   Sputum evaluation THIS SPECIMEN IS ACCEPTABLE FOR SPUTUM CULTURE  Final   Report  Status 01/05/2016 FINAL  Final  Culture, respiratory (NON-Expectorated)     Status: None (Preliminary result)   Collection Time: 01/05/16  9:05 AM  Result Value Ref Range Status   Specimen Description EXPECTORATED SPUTUM  Final   Special Requests NONE Reflexed from [id number]  Final   Gram Stain PENDING  Incomplete   Culture CULTURE REINCUBATED FOR BETTER GROWTH  Final   Report Status PENDING  Incomplete    Coagulation Studies:  Recent Labs  01/05/16 0957  LABPROT 17.1*  INR 1.38    Urinalysis:  Recent Labs  01/03/16 1452  COLORURINE AMBER*  LABSPEC 1.013  PHURINE 5.0  GLUCOSEU NEGATIVE  HGBUR 1+*  BILIRUBINUR NEGATIVE  KETONESUR NEGATIVE  PROTEINUR 100*  NITRITE NEGATIVE  LEUKOCYTESUR NEGATIVE      Imaging: Dg Chest 1 View  01/04/2016  CLINICAL DATA:  Central line placement. EXAM: CHEST 1 VIEW COMPARISON:  Chest x-rays dated 01/03/2016, 12/14/2015 and 03/20/2015.  FINDINGS: The right upper lobe consolidation is increasingly dense on today's exam, again compatible with pneumonia. There is a stable mild prominence of the interstitial markings within each lung. Right IJ central line has been placed with tip well positioned at the level of the lower SVC. No pneumothorax. IMPRESSION: 1. Right IJ central line placement. Tip of the line appears well positioned at the level of the lower SVC. No pneumothorax or other complicating feature identified. 2. Right upper lobe pneumonia, increased in density compared to yesterday's chest x-ray. Electronically Signed   By: Franki Cabot M.D.   On: 01/04/2016 17:09   US Abdomen Limited Ruq  01/06/2016  CLINICAL DATA:  Jaundice. EXAM: US ABDOMEN LIMITED - RIGHT UPPER QUADRANT COMPARISON:  Multiple exams, including 11/19/2012 FINDINGS: Gallbladder: Gallbladder wall thickening at 5 mm. Small amount of pericholecystic fluid and perihepatic fluid. The dependent non shadowing filling defect in the gallbladder compatible with sludge. No visualized gallstones. Common bile duct: Diameter: 5 mm.  No directly visualized choledocholithiasis. Liver: No focal lesion identified. Coarse echogenic liver with poor sonic penetration compatible with diffuse hepatic steatosis. Incidentally, the right kidney appears to be slightly more echogenic than the liver. IMPRESSION: 1. Gallbladder wall thickening with pericholecystic fluid. Sludge in the gallbladder without sonographic Murphy sign or gallstones. Correlate clinically in assessing for acute cholecystitis. 2. Small amount of perihepatic ascites. 3. Coarse echogenic liver with poor sonic penetration compatible with diffuse hepatic steatosis. 4. Echogenic right kidney, nonspecific. Correlate with renal function is in assessing for glomerulonephritis or other potential causes. Electronically Signed   By: Van Clines M.D.   On: 01/06/2016 08:51      Assessment & Plan: [name]. is a 45  y.o. white male with COPD/tobacco use, systolic congestive heart failure, hypertension, hyperlipidemia, type II diabetes mellitus type II, psoariasis, who was admitted to [facility] on 01/03/2016  1. Acute renal failure: secondary to ATN from sepsis/pneumonia and hypotension. Urine output is nonoliguric. No acute indication for dialysis. Continue to monitor volume status, urine output, electrolytes and renal function.  - holding outpatient lisinopril and potassium.   2. Hyponatremia: secondary to congestive heart failure. Not in acute exacerbation. Currently off diuretics. Home regimen of furosemide.   3. Sepsis/pneumonia: afebrile. N. Meningitides.  - ceftriaxone  4. Hepatic failure: shock liver from sepsis.  - Continue to monitor liver function panel.    LOS: 3 Elpidia Karn 5/20/20171:00 PM

## 2016-01-06 NOTE — Progress Notes (Signed)
Dr. Curt Bears notified of SVT. Wants patient to just be monitored at this time since he is asymptomatic. Patient is currently resting with son at bedside. Gabriel [name]

## 2016-01-07 LAB — BASIC METABOLIC PANEL
Anion gap: 8 (ref 5–15)
BUN: 72 mg/dL — ABNORMAL HIGH (ref 6–20)
CO2: 23 mmol/L (ref 22–32)
Calcium: 8.2 mg/dL — ABNORMAL LOW (ref 8.9–10.3)
Chloride: 102 mmol/L (ref 101–111)
Creatinine, Ser: 3 mg/dL — ABNORMAL HIGH (ref 0.61–1.24)
GFR calc Af Amer: 27 mL/min — ABNORMAL LOW (ref >60)
GFR calc non Af Amer: 23 mL/min — ABNORMAL LOW (ref >60)
Glucose, Bld: 116 mg/dL — ABNORMAL HIGH (ref 65–99)
Potassium: 4 mmol/L (ref 3.5–5.1)
Sodium: 133 mmol/L — ABNORMAL LOW (ref 135–145)

## 2016-01-07 LAB — CBC
HCT: 41.3 % (ref 40.0–52.0)
Hemoglobin: 13.5 g/dL (ref 13.0–18.0)
MCH: 27 pg (ref 26.0–34.0)
MCHC: 32.7 g/dL (ref 32.0–36.0)
MCV: 82.6 fL (ref 80.0–100.0)
Platelets: 160 10*3/uL (ref 150–440)
RBC: 5.01 MIL/uL (ref 4.40–5.90)
RDW: 14.8 % — ABNORMAL HIGH (ref 11.5–14.5)
WBC: 13.6 10*3/uL — ABNORMAL HIGH (ref 3.8–10.6)

## 2016-01-07 LAB — COMPLEMENT, TOTAL: Compl, Total (CH50): >60 U/mL — ABNORMAL HIGH (ref 42–60)

## 2016-01-07 MED ORDER — ISOSORBIDE MONONITRATE ER 30 MG PO TB24
30.0000 mg | ORAL_TABLET | Freq: Every day | ORAL | Status: DC
  Administered 2016-01-07 – 2016-01-09 (×3): 30 mg via ORAL
  Filled 2016-01-07 (×3): qty 1

## 2016-01-07 MED ORDER — CARVEDILOL 12.5 MG PO TABS
12.5000 mg | ORAL_TABLET | Freq: Two times a day (BID) | ORAL | Status: DC
  Administered 2016-01-07 – 2016-01-09 (×4): 12.5 mg via ORAL
  Filled 2016-01-07 (×4): qty 1

## 2016-01-07 MED ORDER — HYDRALAZINE HCL 20 MG/ML IJ SOLN: 10.0000 mg | Freq: Four times a day (QID) | INTRAMUSCULAR | Status: DC | PRN

## 2016-01-07 MED ORDER — HYDRALAZINE HCL 25 MG PO TABS
25.0000 mg | ORAL_TABLET | Freq: Three times a day (TID) | ORAL | Status: DC
  Administered 2016-01-07 – 2016-01-08 (×3): 25 mg via ORAL
  Filled 2016-01-07 (×3): qty 1

## 2016-01-07 NOTE — Progress Notes (Addendum)
[facility] NAME: Gabriel [name]    MR#:  [id number]  DATE OF BIRTH:  30-Oct-1970  SUBJECTIVE:  Weakness.  But feels better.  REVIEW OF SYSTEMS:    Review of Systems  Constitutional: Negative for fever, chills, but has Generalized weakness. HENT: Negative for ear discharge, ear pain, hearing loss, nosebleeds and sore throat.   Eyes: Negative for blurred vision and pain.  Respiratory: Negative for cough, hemoptysis and wheezing. No Shortness of breath..   Cardiovascular: Negative for palpitations and leg swelling. No Chest pain.  Gastrointestinal: Negative for nausea, vomiting, abdominal pain, diarrhea and blood in stool.  Genitourinary: Negative for dysuria.  Musculoskeletal: No back pain.  Neurological: Negative for dizziness, tremors, speech change, focal weakness, seizures and headaches.  Endo/Heme/Allergies: Does not bruise/bleed easily.  Psychiatric/Behavioral: Negative for depression, suicidal ideas and hallucinations.   Tolerating Diet:yes  DRUG ALLERGIES:   Allergies  Allergen Reactions  . Prednisone Other (See Comments)    Reaction: Hallucinations     VITALS:  Blood pressure 166/115, pulse 78, temperature 97.8 F (36.6 C), temperature source Oral, resp. rate 17, height 6\' 1"  (1.854 m), weight 114.1 kg (251 lb 8.7 oz), SpO2 97 %.  PHYSICAL EXAMINATION:   Physical Exam  Constitutional: He is oriented to person, place, and time. He appears in no distress. HENT:  Head: Normocephalic.  Eyes: No scleral icterus.  Neck: Normal range of motion. Neck supple. No JVD present. No tracheal deviation present.  Cardiovascular: Normal rate, regular rhythm and normal heart sounds.  Exam reveals no gallop and no friction rub.   No murmur heard. Pulmonary/Chest: Effort normal. No respiratory distress. He has no wheezes or rales.  He exhibits no tenderness.  Abdominal: Soft. Bowel sounds are normal. He exhibits no distension and  no mass. There is no tenderness. There is no rebound and no guarding.  Musculoskeletal: Normal range of motion. He exhibits no edema.  Neurological: He is alert and oriented to person, place, and time.  Skin: Skin is warm. No rash noted. No erythema.  Psychiatric: Affect and judgment normal.   LABORATORY PANEL:   CBC  Recent Labs Lab 01/07/16 0446  WBC 13.6*  HGB 13.5  HCT 41.3  PLT 160   ------------------------------------------------------------------------------------------------------------------  Chemistries   Recent Labs Lab 01/06/16 0450 01/07/16 0446  NA 134* 133*  K 4.2 4.0  CL 102 102  CO2 22 23  GLUCOSE 89 116*  BUN 67* 72*  CREATININE 3.17* 3.00*  CALCIUM 8.1* 8.2*  AST 162*  --   ALT 178*  --   ALKPHOS 46  --   BILITOT 2.8*  --    ------------------------------------------------------------------------------------------------------------------  Cardiac Enzymes  Recent Labs Lab 01/04/16 1009 01/04/16 1401 01/04/16 2020  TROPONINI 0.13* 0.22* 0.53*   ------------------------------------------------------------------------------------------------------------------  RADIOLOGY:  US Abdomen Limited Ruq  01/06/2016  CLINICAL DATA:  Jaundice. EXAM: US ABDOMEN LIMITED - RIGHT UPPER QUADRANT COMPARISON:  Multiple exams, including 11/19/2012 FINDINGS: Gallbladder: Gallbladder wall thickening at 5 mm. Small amount of pericholecystic fluid and perihepatic fluid. The dependent non shadowing filling defect in the gallbladder compatible with sludge. No visualized gallstones. Common bile duct: Diameter: 5 mm.  No directly visualized choledocholithiasis. Liver: No focal lesion identified. Coarse echogenic liver with poor sonic penetration compatible with diffuse hepatic steatosis. Incidentally, the right kidney appears to be slightly more echogenic than the liver. IMPRESSION: 1. Gallbladder wall thickening with pericholecystic fluid. Sludge in the gallbladder without  sonographic Murphy sign or  gallstones. Correlate clinically in assessing for acute cholecystitis. 2. Small amount of perihepatic ascites. 3. Coarse echogenic liver with poor sonic penetration compatible with diffuse hepatic steatosis. 4. Echogenic right kidney, nonspecific. Correlate with renal function is in assessing for glomerulonephritis or other potential causes. Electronically Signed   By: Van Clines M.D.   On: 01/06/2016 08:51     ASSESSMENT AND PLAN:   45 year old male with nonischemic cardiomyopathy EF of 45-50% in 2016, COPD with ongoing tobacco abuse and obesity who presented with sepsis in the setting of right upper lobe pneumonia.  * Neisseria meningitidis bacteremia due to right upper lobe pneumonia with severe sepsis No signs of meningitis. No purpura. discontinued droplet isolation per Dr. Ola Spurr On ceftriaxone 2 g every 12 hours, duration of 3/7 days per Dr. Ola Spurr . Leukocytosis is improving, Follow-up CBC.  * Acute renal failure due to ATN from sepsis/pneumonia and hypotension.. Patient has ejection fraction 20-25%. Need to be very cautious with fluids. holding outpatient lisinopril and potassium. Follow up BMP.  * Hyponatremia: secondary to congestive heart failure.   * Hyperbilirubinemia. Better. This seems chronic.   * Liver function tests due to shock liver from sepsis. Follow-up hepatitis panel and CMP.  * Elevated troponin: Troponin peaked at 1.05 and now is trended down. This is likely due to demand ischemia from problem #1 [place] cardiology consult. At some point patient may need cardiac catheterization as per CARDIOLOGY consult. Continue ASA, statin  * Chronic systolic CHF, Echo showed EF 20-25% History of mild pulmonary edema on chest x-ray. He also has sepsis and acute renal failure. stopped IV fluids. Lasix on hold Start Lasix if CHF is worsening.  * History of essential hypertension: All blood pressure medications were on hold  due to hypotension. Resume coreg due to elevated BP. But hold lisinorpil and lasix.Start IV hydralazine when necessary.  * Back spasm: Flexeril PRN  * tobacco dependence: Counseled by admitting physician On nicotine patch  Weakness: PT.    I discussed with Dr. Juleen China. Management plans discussed with the patient, his wife and father, and they are in agreement.  CODE STATUS: FULL code.  CRITICAL CARE TOTAL TIME TAKING CARE OF THIS PATIENT: 37 minutes.    Demetrios Loll M.D on 01/07/2016 at 12:28 PM  Between 7am to 6pm - Pager - [phone]  After 6pm go to [email or web address] - password [email or web address] [facility]  Office  [phone]  CC: Primary care physician; No PCP Per Patient  Note: This dictation was prepared with Dragon dictation along with smaller phrase technology. Any transcriptional errors that result from this process are unintentional.

## 2016-01-07 NOTE — Progress Notes (Signed)
Patient: Gabriel [name]. / Admit Date: 01/03/2016 / Date of Encounter: 01/07/2016, 12:38 PM   Subjective:  feeling much improved today with improved strength and much less fatigued. Did have episodes of SVT as well as wide complexes and PVCs yesterday, and was potentially aware of some palpitations. Otherwise he has been doing well without major complaint.  Review of Systems: Review of Systems  Constitutional: Positive for fever and malaise/fatigue. Negative for chills, weight loss and diaphoresis.  HENT: Negative for congestion.   Eyes: Negative for discharge and redness.  Respiratory: Positive for cough, shortness of breath and wheezing. Negative for hemoptysis and sputum production.   Cardiovascular: Negative for chest pain, palpitations, orthopnea, claudication, leg swelling and PND.  Gastrointestinal: Negative for heartburn, nausea, vomiting and abdominal pain.  Musculoskeletal: Negative for falls.  Skin: Negative for rash.  Neurological: Positive for weakness. Negative for dizziness, sensory change, speech change and focal weakness.  Endo/Heme/Allergies: Does not bruise/bleed easily.  Psychiatric/Behavioral: The patient is not nervous/anxious.   All other systems reviewed and are negative.    Objective: Telemetry: NSR, 60's to 80's bpm Physical Exam: Blood pressure 166/115, pulse 78, temperature 97.8 F (36.6 C), temperature source Oral, resp. rate 17, height 6\' 1"  (1.854 m), weight 251 lb 8.7 oz (114.1 kg), SpO2 97 %. Body mass index is 33.19 kg/(m^2). General: Well developed, well nourished, in no acute distress. Head: Normocephalic, atraumatic, sclera non-icteric, no xanthomas, nares are without discharge. Neck: Negative for carotid bruits. JVP not elevated. Lungs: Improved breath sounds bilaterally, rhonchi right upper lobe. Breathing is unlabored. Heart: RRR S1 S2 without murmurs, rubs, or gallops.  Abdomen: Soft, non-tender, non-distended with normoactive bowel  sounds. No rebound/guarding. Extremities: No clubbing or cyanosis. No edema. Distal pedal pulses are 2+ and equal bilaterally. Neuro: Alert and oriented X 3. Moves all extremities spontaneously. Psych:  Responds to questions appropriately with a normal affect.   Intake/Output Summary (Last 24 hours) at 01/07/16 1238 Last data filed at 01/07/16 0830  Gross per 24 hour  Intake    240 ml  Output   1050 ml  Net   -810 ml    Inpatient Medications:  . aspirin EC  81 mg Oral Daily  . budesonide (PULMICORT) nebulizer solution  0.25 mg Nebulization BID  . carvedilol  12.5 mg Oral BID WC  . cefTRIAXone (ROCEPHIN)  IV  2 g Intravenous Q24H  . heparin subcutaneous  5,000 Units Subcutaneous Q8H  . nicotine  21 mg Transdermal Daily  . simvastatin  40 mg Oral QPM  . sodium chloride flush  3 mL Intravenous Q12H  . tamsulosin  0.4 mg Oral Daily   Infusions:     Labs:  Recent Labs  01/06/16 0450 01/07/16 0446  NA 134* 133*  K 4.2 4.0  CL 102 102  CO2 22 23  GLUCOSE 89 116*  BUN 67* 72*  CREATININE 3.17* 3.00*  CALCIUM 8.1* 8.2*    Recent Labs  01/04/16 2020 01/06/16 0450  AST 131* 162*  ALT 96* 178*  ALKPHOS 56 46  BILITOT 4.2* 2.8*  PROT 6.0* 6.0*  ALBUMIN 2.7* 2.7*    Recent Labs  01/06/16 0450 01/07/16 0446  WBC 16.2* 13.6*  NEUTROABS 13.3*  --   HGB 13.3 13.5  HCT 40.7 41.3  MCV 83.4 82.6  PLT 155 160    Recent Labs  01/04/16 1401 01/04/16 2020  TROPONINI 0.22* 0.53*   Invalid input(s): POCBNP  Recent Labs  01/04/16 1401  HGBA1C 5.6     Weights: Filed Weights   01/03/16 1202 01/03/16 1559 01/04/16 1700  Weight: 225 lb (102.059 kg) 241 lb (109.317 kg) 251 lb 8.7 oz (114.1 kg)     Radiology/Studies:  Dg Chest 1 View  01/04/2016  CLINICAL DATA:  Central line placement. EXAM: CHEST 1 VIEW COMPARISON:  Chest x-rays dated 01/03/2016, 12/14/2015 and 03/20/2015. FINDINGS: The right upper lobe consolidation is increasingly dense on today's exam,  again compatible with pneumonia. There is a stable mild prominence of the interstitial markings within each lung. Right IJ central line has been placed with tip well positioned at the level of the lower SVC. No pneumothorax. IMPRESSION: 1. Right IJ central line placement. Tip of the line appears well positioned at the level of the lower SVC. No pneumothorax or other complicating feature identified. 2. Right upper lobe pneumonia, increased in density compared to yesterday's chest x-ray. Electronically Signed   By: Franki Cabot M.D.   On: 01/04/2016 17:09   Dg Chest 2 View  01/03/2016  CLINICAL DATA:  Patient states that he has had worsening cough over past 2 weeks which has worsened today. Patient now has right side chest pain, SOB, diarrhea, and nausea. EXAM: CHEST  2 VIEW COMPARISON:  None. FINDINGS: There is right upper lobe consolidation. There is bilateral chronic interstitial lung disease. There is no pleural effusion or pneumothorax. The heart and mediastinal contours are unremarkable. The osseous structures are unremarkable. IMPRESSION: Right upper lobe pneumonia. Followup PA and lateral chest X-ray is recommended in 3-4 weeks following trial of antibiotic therapy to ensure resolution and exclude underlying malignancy. Electronically Signed   By: Kathreen Devoid   On: 01/03/2016 12:52   Dg Chest 2 View  12/14/2015  CLINICAL DATA:  Cough, shortness of breath. EXAM: CHEST  2 VIEW COMPARISON:  November 24, 2015. FINDINGS: Stable cardiomediastinal silhouette. No pneumothorax or pleural effusion is noted. Mildly increased interstitial densities are noted in both lung bases with possible Kerley B-lines seen on the right concerning for minimal to mild pulmonary edema. Bony thorax is unremarkable. IMPRESSION: Mildly increased bibasilar interstitial densities are noted concerning for possible pulmonary edema. Electronically Signed   By: Marijo Conception, M.D.   On: 12/14/2015 11:02   US Abdomen Limited  Ruq  01/06/2016  CLINICAL DATA:  Jaundice. EXAM: US ABDOMEN LIMITED - RIGHT UPPER QUADRANT COMPARISON:  Multiple exams, including 11/19/2012 FINDINGS: Gallbladder: Gallbladder wall thickening at 5 mm. Small amount of pericholecystic fluid and perihepatic fluid. The dependent non shadowing filling defect in the gallbladder compatible with sludge. No visualized gallstones. Common bile duct: Diameter: 5 mm.  No directly visualized choledocholithiasis. Liver: No focal lesion identified. Coarse echogenic liver with poor sonic penetration compatible with diffuse hepatic steatosis. Incidentally, the right kidney appears to be slightly more echogenic than the liver. IMPRESSION: 1. Gallbladder wall thickening with pericholecystic fluid. Sludge in the gallbladder without sonographic Murphy sign or gallstones. Correlate clinically in assessing for acute cholecystitis. 2. Small amount of perihepatic ascites. 3. Coarse echogenic liver with poor sonic penetration compatible with diffuse hepatic steatosis. 4. Echogenic right kidney, nonspecific. Correlate with renal function is in assessing for glomerulonephritis or other potential causes. Electronically Signed   By: Van Clines M.D.   On: 01/06/2016 08:51     Assessment and Plan   1. SVT:  Had some apparent complexes as well as a narrow complex tachycardia. Currently on no rate controlling medications. We'll start him on carvedilol today which Noeli Lavery also help  to improve his blood pressure.   2. Chronic systolic CHF: -He does not appear to be volume overloaded at this time -Coreg and lisinopril are on hold as above given his septic shock. Due to improved blood pressure, Sanya Kobrin restart Coreg today. This Yolette Hastings also help with his SVT. -Echo as above -CHF education needed prior to discharge   4. COPD/ongoing tobacco abuse: -As above -Per IM  5. HTN: - blood pressure elevated today. We'll restart Coreg. We'll also start hydralazine and Imdur for. Should his  kidneys improve, would switch hydralazine and Imdur for lisinopril.  6. HLD: -Simvastatin  -LDL 46  7. DM2: -A1C 5.6% -Per IM   Signed, Tammye Kahler Curt Bears, MD  01/07/2016, 12:38 PM

## 2016-01-07 NOTE — Progress Notes (Signed)
Patient refused PT, per [name] okay to d/c. Patient independent in room. Gabriel [name]

## 2016-01-07 NOTE — Progress Notes (Signed)
PT Cancellation Note  Patient Details Name: Gabriel [name]. MRN: [id number] DOB: 19-Apr-1971   Cancelled Treatment:    Reason Eval/Treat Not Completed: Patient declined, no reason specified;  PT evaluation attempted, pt refusing stating he has been up walking in the room and took a shower today.  Educated pt on importance of walking for recovery and strength.  Pt verbalized understanding and politely deferred PT this date.  RN notified.  [name] A Janson Lamar, PT 01/07/2016, 2:57 PM

## 2016-01-07 NOTE — Progress Notes (Signed)
[facility]  ROUNDING NOTE   Subjective:   Son at bedside.  Creatinine 3 (3.17)  Objective:  Vital signs in last 24 hours:  Temp:  [97.8 F (36.6 C)-98.5 F (36.9 C)] 97.8 F (36.6 C) (05/21 0534) Pulse Rate:  [72-104] 104 (05/21 0534) Resp:  [20] 20 (05/21 0534) BP: (127-155)/(84-106) 147/100 mmHg (05/21 0534) SpO2:  [94 %-98 %] 94 % (05/21 0534)  Weight change:  Filed Weights   01/03/16 1202 01/03/16 1559 01/04/16 1700  Weight: 102.059 kg (225 lb) 109.317 kg (241 lb) 114.1 kg (251 lb 8.7 oz)    Intake/Output: I/O last 3 completed shifts: In: 290 [P.O.:240; IV Piggyback:50] Out: 2125 [Urine:2125]   Intake/Output this shift:     Physical Exam: General: NAD, laying in bed  Head: Normocephalic, atraumatic. Moist oral mucosal membranes  Eyes: Anicteric, PERRL  Neck: Supple, trachea midline  Lungs:  Clear to auscultation  Heart: Regular rate and rhythm  Abdomen:  Soft, nontender, obese  Extremities: no peripheral edema.  Neurologic: Nonfocal, moving all four extremities  Skin: No lesions       Basic Metabolic Panel:  Recent Labs Lab 01/03/16 1218 01/04/16 0418 01/04/16 2020 01/06/16 0450 01/07/16 0446  NA 132* 134* 132* 134* 133*  K 3.4* 3.7 4.1 4.2 4.0  CL 100* 101 101 102 102  CO2 21* 24 23 22 23   GLUCOSE 115* 127* 114* 89 116*  BUN 17 28* 46* 67* 72*  CREATININE 0.94 1.19 2.41* 3.17* 3.00*  CALCIUM 8.0* 7.2* 7.4* 8.1* 8.2*    Liver Function Tests:  Recent Labs Lab 01/04/16 2020 01/06/16 0450  AST 131* 162*  ALT 96* 178*  ALKPHOS 56 46  BILITOT 4.2* 2.8*  PROT 6.0* 6.0*  ALBUMIN 2.7* 2.7*   No results for input(s): LIPASE, AMYLASE in the last 168 hours. No results for input(s): AMMONIA in the last 168 hours.  CBC:  Recent Labs Lab 01/03/16 1218 01/04/16 0418 01/05/16 0613 01/06/16 0450 01/07/16 0446  WBC 28.2* 18.0* 18.8* 16.2* 13.6*  NEUTROABS  --   --   --  13.3*  --   HGB 15.1 13.7 12.9* 13.3 13.5  HCT 44.9  42.0 39.1* 40.7 41.3  MCV 81.1 83.2 82.8 83.4 82.6  PLT 163 100* 140* 155 160    Cardiac Enzymes:  Recent Labs Lab 01/03/16 2132 01/04/16 0418 01/04/16 1009 01/04/16 1401 01/04/16 2020  TROPONINI 0.18* 1.05* 0.13* 0.22* 0.53*    BNP: Invalid input(s): POCBNP  CBG:  Recent Labs Lab 01/04/16 1028 01/04/16 1614  GLUCAP 213* 140*    Microbiology: Results for orders placed or performed during the hospital encounter of 01/03/16  Urine culture     Status: None   Collection Time: 01/03/16  1:32 PM  Result Value Ref Range Status   Specimen Description URINE, RANDOM  Final   Special Requests NONE  Final   Culture NO GROWTH Performed at [facility]   Final   Report Status 01/04/2016 FINAL  Final  Blood Culture (routine x 2)     Status: None (Preliminary result)   Collection Time: 01/03/16  1:45 PM  Result Value Ref Range Status   Specimen Description BLOOD RIGHT ARM  Final   Special Requests BOTTLES DRAWN AEROBIC AND ANAEROBIC 5CCAERO,4CCANA  Final   Culture NO GROWTH 3 DAYS  Final   Report Status PENDING  Incomplete  Blood Culture (routine x 2)     Status: Abnormal (Preliminary result)   Collection Time: 01/03/16  1:45  PM  Result Value Ref Range Status   Specimen Description BLOOD LEFT ASSIST CONTROL  Final   Special Requests BOTTLES DRAWN AEROBIC AND ANAEROBIC [place]  Final   Culture  Setup Time (A)  Final    GRAM NEGATIVE DIPLOCOCCI AEROBIC BOTTLE ONLY CRITICAL RESULT CALLED TO, READ BACK BY AND VERIFIED WITH: MELISSA Maish Vaya AT 1938 01/04/16 MLZ  CONFIRMED BY MSS    Culture NEISSERIA MENINGITIDIS BETA LACTAMASE NEGATIVE  (A)  Final   Report Status PENDING  Incomplete  Blood Culture ID Panel (Reflexed)     Status: Abnormal   Collection Time: 01/03/16  1:45 PM  Result Value Ref Range Status   Enterococcus species NOT DETECTED NOT DETECTED Final   Vancomycin resistance NOT DETECTED NOT DETECTED Final   Listeria monocytogenes NOT DETECTED NOT  DETECTED Final   Staphylococcus species NOT DETECTED NOT DETECTED Final   Staphylococcus aureus NOT DETECTED NOT DETECTED Final   Methicillin resistance NOT DETECTED NOT DETECTED Final   Streptococcus species NOT DETECTED NOT DETECTED Final   Streptococcus agalactiae NOT DETECTED NOT DETECTED Final   Streptococcus pneumoniae NOT DETECTED NOT DETECTED Final   Streptococcus pyogenes NOT DETECTED NOT DETECTED Final   Acinetobacter baumannii NOT DETECTED NOT DETECTED Final   Enterobacteriaceae species NOT DETECTED NOT DETECTED Final   Enterobacter cloacae complex NOT DETECTED NOT DETECTED Final   Escherichia coli NOT DETECTED NOT DETECTED Final   Klebsiella oxytoca NOT DETECTED NOT DETECTED Final   Klebsiella pneumoniae NOT DETECTED NOT DETECTED Final   Proteus species NOT DETECTED NOT DETECTED Final   Serratia marcescens NOT DETECTED NOT DETECTED Final   Carbapenem resistance NOT DETECTED NOT DETECTED Final   Haemophilus influenzae NOT DETECTED NOT DETECTED Final   Neisseria meningitidis DETECTED (A) NOT DETECTED Final    Comment: CALLED TO MELISSA MACCIA AT 1938 01/04/16 MLZ    Pseudomonas aeruginosa NOT DETECTED NOT DETECTED Final   Candida albicans NOT DETECTED NOT DETECTED Final   Candida glabrata NOT DETECTED NOT DETECTED Final   Candida krusei NOT DETECTED NOT DETECTED Final   Candida parapsilosis NOT DETECTED NOT DETECTED Final   Candida tropicalis NOT DETECTED NOT DETECTED Final  MRSA PCR Screening     Status: None   Collection Time: 01/04/16  4:00 PM  Result Value Ref Range Status   MRSA by PCR NEGATIVE NEGATIVE Final    Comment:        The GeneXpert MRSA Assay (FDA approved for NASAL specimens only), is one component of a comprehensive MRSA colonization surveillance program. It is not intended to diagnose MRSA infection nor to guide or monitor treatment for MRSA infections.   Culture, expectorated sputum-assessment     Status: None   Collection Time: 01/05/16  9:05 AM   Result Value Ref Range Status   Specimen Description EXPECTORATED SPUTUM  Final   Special Requests NONE  Final   Sputum evaluation THIS SPECIMEN IS ACCEPTABLE FOR SPUTUM CULTURE  Final   Report Status 01/05/2016 FINAL  Final  Culture, respiratory (NON-Expectorated)     Status: None (Preliminary result)   Collection Time: 01/05/16  9:05 AM  Result Value Ref Range Status   Specimen Description EXPECTORATED SPUTUM  Final   Special Requests NONE Reflexed from [id number]  Final   Gram Stain   Final    FEW WBC SEEN FEW GRAM POSITIVE COCCI IN PAIRS FEW GRAM NEGATIVE RODS GOOD SPECIMEN - 80-90% WBCS    Culture CULTURE REINCUBATED FOR BETTER GROWTH  Final   Report Status  PENDING  Incomplete    Coagulation Studies:  Recent Labs  01/05/16 0957  LABPROT 17.1*  INR 1.38    Urinalysis: No results for input(s): COLORURINE, LABSPEC, PHURINE, GLUCOSEU, HGBUR, BILIRUBINUR, KETONESUR, PROTEINUR, UROBILINOGEN, NITRITE, LEUKOCYTESUR in the last 72 hours.  Invalid input(s): APPERANCEUR    Imaging: US Abdomen Limited Ruq  01/06/2016  CLINICAL DATA:  Jaundice. EXAM: US ABDOMEN LIMITED - RIGHT UPPER QUADRANT COMPARISON:  Multiple exams, including 11/19/2012 FINDINGS: Gallbladder: Gallbladder wall thickening at 5 mm. Small amount of pericholecystic fluid and perihepatic fluid. The dependent non shadowing filling defect in the gallbladder compatible with sludge. No visualized gallstones. Common bile duct: Diameter: 5 mm.  No directly visualized choledocholithiasis. Liver: No focal lesion identified. Coarse echogenic liver with poor sonic penetration compatible with diffuse hepatic steatosis. Incidentally, the right kidney appears to be slightly more echogenic than the liver. IMPRESSION: 1. Gallbladder wall thickening with pericholecystic fluid. Sludge in the gallbladder without sonographic Murphy sign or gallstones. Correlate clinically in assessing for acute cholecystitis. 2. Small amount of perihepatic  ascites. 3. Coarse echogenic liver with poor sonic penetration compatible with diffuse hepatic steatosis. 4. Echogenic right kidney, nonspecific. Correlate with renal function is in assessing for glomerulonephritis or other potential causes. Electronically Signed   By: Van Clines M.D.   On: 01/06/2016 08:51     Medications:     . aspirin EC  81 mg Oral Daily  . budesonide (PULMICORT) nebulizer solution  0.25 mg Nebulization BID  . cefTRIAXone (ROCEPHIN)  IV  2 g Intravenous Q24H  . heparin subcutaneous  5,000 Units Subcutaneous Q8H  . nicotine  21 mg Transdermal Daily  . simvastatin  40 mg Oral QPM  . sodium chloride flush  3 mL Intravenous Q12H  . tamsulosin  0.4 mg Oral Daily   acetaminophen **OR** acetaminophen, cyclobenzaprine, guaiFENesin-codeine, HYDROmorphone (DILAUDID) injection, ipratropium-albuterol, morphine injection, ondansetron **OR** ondansetron (ZOFRAN) IV, oxyCODONE  Assessment/ Plan:  [name]. is a 45 y.o. white male with COPD/tobacco use, systolic congestive heart failure, hypertension, hyperlipidemia, type II diabetes mellitus type II, psoariasis, who was admitted to [facility] on 01/03/2016  1. Acute renal failure: secondary to ATN from sepsis/pneumonia and hypotension. Urine output is nonoliguric. No acute indication for dialysis. Continue to monitor volume status, urine output, electrolytes and renal function.  - holding outpatient lisinopril and potassium.   2. Hyponatremia: secondary to congestive heart failure. Not in acute exacerbation. Currently off diuretics. Home regimen of furosemide.   3. Sepsis/pneumonia: afebrile. N. Meningitides.  - ceftriaxone  4. Hepatic failure: shock liver from sepsis.  - Continue to monitor liver function panel.    LOS: [place], Laurenashley Viar 5/21/201710:27 AM

## 2016-01-08 LAB — COMPREHENSIVE METABOLIC PANEL
ALT: 147 U/L — ABNORMAL HIGH (ref 17–63)
AST: 84 U/L — ABNORMAL HIGH (ref 15–41)
Albumin: 2.5 g/dL — ABNORMAL LOW (ref 3.5–5.0)
Alkaline Phosphatase: 67 U/L (ref 38–126)
Anion gap: 7 (ref 5–15)
BUN: 58 mg/dL — ABNORMAL HIGH (ref 6–20)
CO2: 24 mmol/L (ref 22–32)
Calcium: 8.3 mg/dL — ABNORMAL LOW (ref 8.9–10.3)
Chloride: 103 mmol/L (ref 101–111)
Creatinine, Ser: 2.41 mg/dL — ABNORMAL HIGH (ref 0.61–1.24)
GFR calc Af Amer: 35 mL/min — ABNORMAL LOW (ref >60)
GFR calc non Af Amer: 31 mL/min — ABNORMAL LOW (ref >60)
Glucose, Bld: 107 mg/dL — ABNORMAL HIGH (ref 65–99)
Potassium: 4.1 mmol/L (ref 3.5–5.1)
Sodium: 134 mmol/L — ABNORMAL LOW (ref 135–145)
Total Bilirubin: 1.8 mg/dL — ABNORMAL HIGH (ref 0.3–1.2)
Total Protein: 5.9 g/dL — ABNORMAL LOW (ref 6.5–8.1)

## 2016-01-08 LAB — CULTURE, BLOOD (ROUTINE X 2): Culture: NO GROWTH

## 2016-01-08 LAB — CBC
HCT: 39.6 % — ABNORMAL LOW (ref 40.0–52.0)
Hemoglobin: 13.2 g/dL (ref 13.0–18.0)
MCH: 28.2 pg (ref 26.0–34.0)
MCHC: 33.2 g/dL (ref 32.0–36.0)
MCV: 85 fL (ref 80.0–100.0)
Platelets: 163 10*3/uL (ref 150–440)
RBC: 4.66 MIL/uL (ref 4.40–5.90)
RDW: 14.8 % — ABNORMAL HIGH (ref 11.5–14.5)
WBC: 11.4 10*3/uL — ABNORMAL HIGH (ref 3.8–10.6)

## 2016-01-08 MED ORDER — HYDRALAZINE HCL 50 MG PO TABS
50.0000 mg | ORAL_TABLET | Freq: Three times a day (TID) | ORAL | Status: DC
  Administered 2016-01-08 – 2016-01-09 (×3): 50 mg via ORAL
  Filled 2016-01-08 (×3): qty 1

## 2016-01-08 MED ORDER — FUROSEMIDE 40 MG PO TABS
40.0000 mg | ORAL_TABLET | Freq: Every day | ORAL | Status: DC
  Administered 2016-01-08 – 2016-01-09 (×2): 40 mg via ORAL
  Filled 2016-01-08 (×2): qty 1

## 2016-01-08 MED ORDER — HYDRALAZINE HCL 25 MG PO TABS
25.0000 mg | ORAL_TABLET | Freq: Once | ORAL | Status: AC
  Administered 2016-01-08: 25 mg via ORAL
  Filled 2016-01-08: qty 1

## 2016-01-08 NOTE — Progress Notes (Signed)
Pharmacy Antibiotic Note  Gabriel [name]. is a 45 y.o. male admitted on 01/03/2016 with bacteremia-NEISSERIA MENINGITIDIS.  Pharmacy has been consulted for Rocephin dosing.  Plan: Will continue Ceftriaxone 2 gm IV q 24 - if clinically improving would rec a duration of 7 days per MD Ola Spurr. If ready for dc prior to completion of 7 day course could be given IM at home. MD placed orders to check HIV and CH50 to screen for complement deficiency  5/22: PER ID note- Ceftriaxone day 6 of 7. HIV negative, CH50 nml. Patient still with cough.   Height: 6\' 1"  (185.4 cm) Weight: 251 lb 8.7 oz (114.1 kg) IBW/kg (Calculated) : 79.9  Temp (24hrs), Avg:98.1 F (36.7 C), Min:97.5 F (36.4 C), Max:98.9 F (37.2 C)   Recent Labs Lab 01/03/16 1345 01/03/16 1621 01/04/16 0418 01/04/16 2020 01/05/16 [id number] 01/06/16 0450 01/07/16 0446 01/08/16 0527  WBC  --   --  18.0*  --  18.8* 16.2* 13.6* 11.4*  CREATININE  --   --  1.19 2.41*  --  3.17* 3.00* 2.41*  LATICACIDVEN 2.0 3.6*  --   --  1.3  --   --   --     Estimated Creatinine Clearance: 50.7 mL/min (by C-G formula based on Cr of 2.41).    Allergies  Allergen Reactions  . Prednisone Other (See Comments)    Reaction: Hallucinations     Antimicrobials this admission: Vancomycin  5/18 >> 5/18 Zosyn  5/18 >> 5/18 Ceftriaxone 5/18 >>>  Dose adjustments this admission: Ceftriaxone 2 g IV q24 hours  Microbiology results:  BCx: Neisseria meningitidis   UCx:    Sputum:    MRSA PCR:   Thank you for allowing pharmacy to be a part of this patient's care.  Josue Falconi A 01/08/2016 3:54 PM

## 2016-01-08 NOTE — Progress Notes (Signed)
[facility]  ROUNDING NOTE   Subjective:  Renal function continues to improve. BUN down to 58 with a creatinine of 2.4. Still having some shortness of breath and right-sided chest pain when he takes deep inspiration.   Objective:  Vital signs in last 24 hours:  Temp:  [97.5 F (36.4 C)-98.9 F (37.2 C)] 98.9 F (37.2 C) (05/22 1111) Pulse Rate:  [61-75] 61 (05/22 1111) Resp:  [16-20] 18 (05/22 1111) BP: (130-181)/(83-116) 130/87 mmHg (05/22 1111) SpO2:  [91 %-97 %] 93 % (05/22 1111)  Weight change:  Filed Weights   01/03/16 1202 01/03/16 1559 01/04/16 1700  Weight: 102.059 kg (225 lb) 109.317 kg (241 lb) 114.1 kg (251 lb 8.7 oz)    Intake/Output: I/O last 3 completed shifts: In: 480 [P.O.:480] Out: 2100 [Urine:2100]   Intake/Output this shift:  Total I/O In: 240 [P.O.:240] Out: -   Physical Exam: General: NAD, laying in bed  Head: Normocephalic, atraumatic. Moist oral mucosal membranes  Eyes: Anicteric, PERRL  Neck: Supple, trachea midline  Lungs:  Clear to auscultation  Heart: Regular rate and rhythm  Abdomen:  Soft, nontender, obese  Extremities: no peripheral edema.  Neurologic: Nonfocal, moving all four extremities  Skin: No lesions       Basic Metabolic Panel:  Recent Labs Lab 01/04/16 0418 01/04/16 2020 01/06/16 0450 01/07/16 0446 01/08/16 0527  NA 134* 132* 134* 133* 134*  K 3.7 4.1 4.2 4.0 4.1  CL 101 101 102 102 103  CO2 24 23 22 23 24   GLUCOSE 127* 114* 89 116* 107*  BUN 28* 46* 67* 72* 58*  CREATININE 1.19 2.41* 3.17* 3.00* 2.41*  CALCIUM 7.2* 7.4* 8.1* 8.2* 8.3*    Liver Function Tests:  Recent Labs Lab 01/04/16 2020 01/06/16 0450 01/08/16 0527  AST 131* 162* 84*  ALT 96* 178* 147*  ALKPHOS 56 46 67  BILITOT 4.2* 2.8* 1.8*  PROT 6.0* 6.0* 5.9*  ALBUMIN 2.7* 2.7* 2.5*   No results for input(s): LIPASE, AMYLASE in the last 168 hours. No results for input(s): AMMONIA in the last 168 hours.  CBC:  Recent  Labs Lab 01/04/16 0418 01/05/16 0613 01/06/16 0450 01/07/16 0446 01/08/16 0527  WBC 18.0* 18.8* 16.2* 13.6* 11.4*  NEUTROABS  --   --  13.3*  --   --   HGB 13.7 12.9* 13.3 13.5 13.2  HCT 42.0 39.1* 40.7 41.3 39.6*  MCV 83.2 82.8 83.4 82.6 85.0  PLT 100* 140* 155 160 163    Cardiac Enzymes:  Recent Labs Lab 01/03/16 2132 01/04/16 0418 01/04/16 1009 01/04/16 1401 01/04/16 2020  TROPONINI 0.18* 1.05* 0.13* 0.22* 0.53*    BNP: Invalid input(s): POCBNP  CBG:  Recent Labs Lab 01/04/16 1028 01/04/16 1614  GLUCAP 213* 140*    Microbiology: Results for orders placed or performed during the hospital encounter of 01/03/16  Urine culture     Status: None   Collection Time: 01/03/16  1:32 PM  Result Value Ref Range Status   Specimen Description URINE, RANDOM  Final   Special Requests NONE  Final   Culture NO GROWTH Performed at [facility]   Final   Report Status 01/04/2016 FINAL  Final  Blood Culture (routine x 2)     Status: None (Preliminary result)   Collection Time: 01/03/16  1:45 PM  Result Value Ref Range Status   Specimen Description BLOOD RIGHT ARM  Final   Special Requests BOTTLES DRAWN AEROBIC AND ANAEROBIC 5CCAERO,4CCANA  Final   Culture  NO GROWTH 4 DAYS  Final   Report Status PENDING  Incomplete  Blood Culture (routine x 2)     Status: Abnormal   Collection Time: 01/03/16  1:45 PM  Result Value Ref Range Status   Specimen Description BLOOD LEFT ASSIST CONTROL  Final   Special Requests BOTTLES DRAWN AEROBIC AND ANAEROBIC [place]  Final   Culture  Setup Time (A)  Final    GRAM NEGATIVE DIPLOCOCCI AEROBIC BOTTLE ONLY CRITICAL RESULT CALLED TO, READ BACK BY AND VERIFIED WITH: MELISSA [place] 01/04/16 MLZ  CONFIRMED BY MSS    Culture NEISSERIA MENINGITIDIS BETA LACTAMASE NEGATIVE  (A)  Final   Report Status 01/08/2016 FINAL  Final  Blood Culture ID Panel (Reflexed)     Status: Abnormal   Collection Time: 01/03/16  1:45 PM   Result Value Ref Range Status   Enterococcus species NOT DETECTED NOT DETECTED Final   Vancomycin resistance NOT DETECTED NOT DETECTED Final   Listeria monocytogenes NOT DETECTED NOT DETECTED Final   Staphylococcus species NOT DETECTED NOT DETECTED Final   Staphylococcus aureus NOT DETECTED NOT DETECTED Final   Methicillin resistance NOT DETECTED NOT DETECTED Final   Streptococcus species NOT DETECTED NOT DETECTED Final   Streptococcus agalactiae NOT DETECTED NOT DETECTED Final   Streptococcus pneumoniae NOT DETECTED NOT DETECTED Final   Streptococcus pyogenes NOT DETECTED NOT DETECTED Final   Acinetobacter baumannii NOT DETECTED NOT DETECTED Final   Enterobacteriaceae species NOT DETECTED NOT DETECTED Final   Enterobacter cloacae complex NOT DETECTED NOT DETECTED Final   Escherichia coli NOT DETECTED NOT DETECTED Final   Klebsiella oxytoca NOT DETECTED NOT DETECTED Final   Klebsiella pneumoniae NOT DETECTED NOT DETECTED Final   Proteus species NOT DETECTED NOT DETECTED Final   Serratia marcescens NOT DETECTED NOT DETECTED Final   Carbapenem resistance NOT DETECTED NOT DETECTED Final   Haemophilus influenzae NOT DETECTED NOT DETECTED Final   Neisseria meningitidis DETECTED (A) NOT DETECTED Final    Comment: CALLED TO MELISSA MACCIA AT 1938 01/04/16 MLZ    Pseudomonas aeruginosa NOT DETECTED NOT DETECTED Final   Candida albicans NOT DETECTED NOT DETECTED Final   Candida glabrata NOT DETECTED NOT DETECTED Final   Candida krusei NOT DETECTED NOT DETECTED Final   Candida parapsilosis NOT DETECTED NOT DETECTED Final   Candida tropicalis NOT DETECTED NOT DETECTED Final  MRSA PCR Screening     Status: None   Collection Time: 01/04/16  4:00 PM  Result Value Ref Range Status   MRSA by PCR NEGATIVE NEGATIVE Final    Comment:        The GeneXpert MRSA Assay (FDA approved for NASAL specimens only), is one component of a comprehensive MRSA colonization surveillance program. It is  not intended to diagnose MRSA infection nor to guide or monitor treatment for MRSA infections.   Culture, expectorated sputum-assessment     Status: None   Collection Time: 01/05/16  9:05 AM  Result Value Ref Range Status   Specimen Description EXPECTORATED SPUTUM  Final   Special Requests NONE  Final   Sputum evaluation THIS SPECIMEN IS ACCEPTABLE FOR SPUTUM CULTURE  Final   Report Status 01/05/2016 FINAL  Final  Culture, respiratory (NON-Expectorated)     Status: None (Preliminary result)   Collection Time: 01/05/16  9:05 AM  Result Value Ref Range Status   Specimen Description EXPECTORATED SPUTUM  Final   Special Requests NONE Reflexed from [id number]  Final   Gram Stain   Final  FEW WBC SEEN FEW GRAM POSITIVE COCCI IN PAIRS FEW GRAM NEGATIVE RODS GOOD SPECIMEN - 80-90% WBCS    Culture HOLDING FOR POSSIBLE PATHOGEN  Final   Report Status PENDING  Incomplete    Coagulation Studies: No results for input(s): LABPROT, INR in the last 72 hours.  Urinalysis: No results for input(s): COLORURINE, LABSPEC, PHURINE, GLUCOSEU, HGBUR, BILIRUBINUR, KETONESUR, PROTEINUR, UROBILINOGEN, NITRITE, LEUKOCYTESUR in the last 72 hours.  Invalid input(s): APPERANCEUR    Imaging: No results found.   Medications:     . aspirin EC  81 mg Oral Daily  . budesonide (PULMICORT) nebulizer solution  0.25 mg Nebulization BID  . carvedilol  12.5 mg Oral BID WC  . cefTRIAXone (ROCEPHIN)  IV  2 g Intravenous Q24H  . furosemide  40 mg Oral Daily  . heparin subcutaneous  5,000 Units Subcutaneous Q8H  . hydrALAZINE  50 mg Oral Q8H  . isosorbide mononitrate  30 mg Oral Daily  . nicotine  21 mg Transdermal Daily  . simvastatin  40 mg Oral QPM  . sodium chloride flush  3 mL Intravenous Q12H  . tamsulosin  0.4 mg Oral Daily   acetaminophen **OR** acetaminophen, cyclobenzaprine, guaiFENesin-codeine, hydrALAZINE, HYDROmorphone (DILAUDID) injection, ipratropium-albuterol, morphine injection, ondansetron  **OR** ondansetron (ZOFRAN) IV, oxyCODONE  Assessment/ Plan:  Mr. Karry Gaebler. is a 45 y.o. white male with COPD/tobacco use, systolic congestive heart failure, hypertension, hyperlipidemia, type II diabetes mellitus type II, psoariasis, who was admitted to [facility] on 01/03/2016  1. Acute renal failure: secondary to ATN from sepsis/pneumonia and hypotension. Urine output is nonoliguric.  - enal function continues to improve.  ncouraged patient to increase by mouth fluid intake.  Continue to monitor renal function trend and avoid nephrotoxins as possible.  2. 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  Odium currently 134.  Continue to monitor.  3. N. Meningococcal sepsis A39.4: N. Meningitides.  - continue high-dose ceftriaxone 2 g intravenous daily.  4. Hepatic failure: shock liver from sepsis.  - Continue to monitor liver function panel.    LOS: 5 Kaslyn [place] 5/22/20171:28 PM

## 2016-01-08 NOTE — Progress Notes (Signed)
[name] [place] NAME: Gabriel [name]    MR#:  [id number]  DATE OF BIRTH:  02/19/1971  SUBJECTIVE:  No complaint.  REVIEW OF SYSTEMS:    Review of Systems  Constitutional: Negative for fever, chills, better generalized weakness. HENT: Negative for ear discharge, ear pain, hearing loss, nosebleeds and sore throat.   Eyes: Negative for blurred vision and pain.  Respiratory: Negative for cough, hemoptysis and wheezing. No Shortness of breath..   Cardiovascular: Negative for palpitations and leg swelling. No Chest pain.  Gastrointestinal: Negative for nausea, vomiting, abdominal pain, diarrhea and blood in stool.  Genitourinary: Negative for dysuria.  Musculoskeletal: No back pain.  Neurological: Negative for dizziness, tremors, speech change, focal weakness, seizures and headaches.  Endo/Heme/Allergies: Does not bruise/bleed easily.  Psychiatric/Behavioral: Negative for depression, suicidal ideas and hallucinations.   Tolerating Diet:yes  DRUG ALLERGIES:   Allergies  Allergen Reactions  . Prednisone Other (See Comments)    Reaction: Hallucinations     VITALS:  Blood pressure 130/87, pulse 61, temperature 98.9 F (37.2 C), temperature source Oral, resp. rate 18, height 6\' 1"  (1.854 m), weight 114.1 kg (251 lb 8.7 oz), SpO2 93 %.  PHYSICAL EXAMINATION:   Physical Exam  Constitutional: He is oriented to person, place, and time. He appears in no distress. HENT:  Head: Normocephalic.  Eyes: No scleral icterus.  Neck: Normal range of motion. Neck supple. No JVD present. No tracheal deviation present.  Cardiovascular: Normal rate, regular rhythm and normal heart sounds.  Exam reveals no gallop and no friction rub.   No murmur heard. Pulmonary/Chest: Effort normal. No respiratory distress. He has no wheezes or rales.  He exhibits no tenderness.  Abdominal: Soft. Bowel sounds are normal. He exhibits no distension and no mass. There is  no tenderness. There is no rebound and no guarding.  Musculoskeletal: Normal range of motion. He exhibits no edema.  Neurological: He is alert and oriented to person, place, and time.  Skin: Skin is warm. No rash noted. No erythema.  Psychiatric: Affect and judgment normal.   LABORATORY PANEL:   CBC  Recent Labs Lab 01/08/16 0527  WBC 11.4*  HGB 13.2  HCT 39.6*  PLT 163   ------------------------------------------------------------------------------------------------------------------  Chemistries   Recent Labs Lab 01/08/16 0527  NA 134*  K 4.1  CL 103  CO2 24  GLUCOSE 107*  BUN 58*  CREATININE 2.41*  CALCIUM 8.3*  AST 84*  ALT 147*  ALKPHOS 67  BILITOT 1.8*   ------------------------------------------------------------------------------------------------------------------  Cardiac Enzymes  Recent Labs Lab 01/04/16 1009 01/04/16 1401 01/04/16 2020  TROPONINI 0.13* 0.22* 0.53*   ------------------------------------------------------------------------------------------------------------------  RADIOLOGY:  No results found.   ASSESSMENT AND PLAN:   45 year old male with nonischemic cardiomyopathy EF of 45-50% in 2016, COPD with ongoing tobacco abuse and obesity who presented with sepsis in the setting of right upper lobe pneumonia.  * Neisseria meningitidis bacteremia due to right upper lobe pneumonia with severe sepsis No signs of meningitis. No purpura. discontinued droplet isolation per Dr. Ola Spurr On ceftriaxone 2 g every 12 hours, tomorrow will be day 7/7 IV ceftriaxone, Can dc at that time but given persistent sputum can cough would give augmentin for another 7 days Will need a follow up CXR to document resolution of infiltrate per Dr. Ola Spurr . Leukocytosis is improved.  * Acute renal failure due to ATN from sepsis/pneumonia and hypotension.. Patient has ejection fraction 20-25%. Need to be very cautious with fluids. holding outpatient  lisinopril and potassium. Follow up BMP. Improving.  * Hyponatremia: secondary to congestive heart failure. Stable.  * Hyperbilirubinemia. Better. Cr. 1.8. This seems chronic.   * Liver function tests due to shock liver from sepsis. Improving.  * Elevated troponin: Troponin peaked at 1.05 and now is trended down. This is likely due to demand ischemia from problem #1 [place] cardiology consult. At some point patient may need cardiac catheterization as per CARDIOLOGY consult. Continue ASA, statin  * Chronic systolic CHF, Echo showed EF 20-25% History of mild pulmonary edema on chest x-ray. He also has sepsis and acute renal failure. stopped IV fluids. Lasix on hold Start Lasix per cardiologist.  * History of essential hypertension: All blood pressure medications were on hold due to hypotension. Resumed coreg due to elevated BP. But hold lisinorpil and lasix.Started IV hydralazine when necessary.  * Back spasm: Flexeril PRN  * tobacco dependence: Counseled by admitting physician On nicotine patch  Weakness: he refused PT.  CODE STATUS: FULL code.  TOTAL TIME TAKING CARE OF THIS PATIENT: 36 minutes.  Greater than 50% time was spent on coordination of care and face-to-face counseling.  POSSIBLE D/C IN 1-2 DAYS, DEPENDING ON CLINICAL CONDITION.   Demetrios Loll M.D on 01/08/2016 at 4:04 PM  Between 7am to 6pm - Pager - [phone]  After 6pm go to [email or web address] - password [email or web address] [facility]  Office  [phone]  CC: Primary care physician; No PCP Per Patient  Note: This dictation was prepared with Dragon dictation along with smaller phrase technology. Any transcriptional errors that result from this process are unintentional.

## 2016-01-08 NOTE — Progress Notes (Signed)
[facility] INFECTIOUS DISEASE PROGRESS NOTE Date of Admission:  01/03/2016     ID: Gabriel [name]. is a 45 y.o. 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 Wbc down to 11.  Remains on ctx Still some cough with sputum  ROS  Eleven systems are reviewed and negative except per hpi  Medications:  Antibiotics Given (last 72 hours)    Date/Time Action Medication Dose Rate   01/06/16 0943 Given   cefTRIAXone (ROCEPHIN) 2 g in dextrose 5 % 50 mL IVPB 2 g 100 mL/hr   01/07/16 1023 Given   cefTRIAXone (ROCEPHIN) 2 g in dextrose 5 % 50 mL IVPB 2 g 100 mL/hr   01/08/16 0917 Given   cefTRIAXone (ROCEPHIN) 2 g in dextrose 5 % 50 mL IVPB 2 g 100 mL/hr     . aspirin EC  81 mg Oral Daily  . budesonide (PULMICORT) nebulizer solution  0.25 mg Nebulization BID  . carvedilol  12.5 mg Oral BID WC  . cefTRIAXone (ROCEPHIN)  IV  2 g Intravenous Q24H  . furosemide  40 mg Oral Daily  . heparin subcutaneous  5,000 Units Subcutaneous Q8H  . hydrALAZINE  50 mg Oral Q8H  . isosorbide mononitrate  30 mg Oral Daily  . nicotine  21 mg Transdermal Daily  . simvastatin  40 mg Oral QPM  . sodium chloride flush  3 mL Intravenous Q12H  . tamsulosin  0.4 mg Oral Daily    Objective: Vital signs in last 24 hours: Temp:  [97.5 F (36.4 C)-98.9 F (37.2 C)] 98.9 F (37.2 C) (05/22 1111) Pulse Rate:  [61-70] 61 (05/22 1111) Resp:  [16-20] 18 (05/22 1111) BP: (130-167)/(83-112) 130/87 mmHg (05/22 1111) SpO2:  [91 %-97 %] 93 % (05/22 1111) Constitutional: He is oriented to person, place, and time. Obese, dishelved. HENT: perrla, mild icterus Mouth/Throat: Oropharynx is clear and dry . No oropharyngeal exudate.  Cardiovascular: Normal  rate, regular rhythm and normal heart sounds.  Pulmonary/Chest: rhonci bil Abdominal: Soft. Bowel sounds are normal. Mild distension. There is no tenderness.  Lymphadenopathy: He has no cervical adenopathy.  Neurological: He is alert and oriented to person, place, and time.  Skin: Skin is warm and dry. No rash noted. No erythema. No purpura Psychiatric: He has a normal mood and affect. His behavior is normal.   Lab Results  Recent Labs  01/07/16 0446 01/08/16 0527  WBC 13.6* 11.4*  HGB 13.5 13.2  HCT 41.3 39.6*  NA 133* 134*  K 4.0 4.1  CL 102 103  CO2 23 24  BUN 72* 58*  CREATININE 3.00* 2.41*    Microbiology: Results for orders placed or performed during the hospital encounter of 01/03/16  Urine culture     Status: None   Collection Time: 01/03/16  1:32 PM  Result Value Ref Range Status   Specimen Description URINE, RANDOM  Final   Special Requests NONE  Final   Culture NO GROWTH Performed at [facility]   Final   Report Status 01/04/2016 FINAL  Final  Blood Culture (routine x 2)     Status: None (Preliminary result)   Collection Time: 01/03/16  1:45 PM  Result Value Ref Range Status   Specimen Description  BLOOD RIGHT ARM  Final   Special Requests BOTTLES DRAWN AEROBIC AND ANAEROBIC [id number]  Final   Culture NO GROWTH 4 DAYS  Final   Report Status PENDING  Incomplete  Blood Culture (routine x 2)     Status: Abnormal   Collection Time: 01/03/16  1:45 PM  Result Value Ref Range Status   Specimen Description BLOOD LEFT ASSIST CONTROL  Final   Special Requests BOTTLES DRAWN AEROBIC AND ANAEROBIC [place]  Final   Culture  Setup Time (A)  Final    GRAM NEGATIVE DIPLOCOCCI AEROBIC BOTTLE ONLY CRITICAL RESULT CALLED TO, READ BACK BY AND VERIFIED WITH: MELISSA Coates AT 1938 01/04/16 MLZ  CONFIRMED BY MSS    Culture NEISSERIA MENINGITIDIS BETA LACTAMASE NEGATIVE  (A)  Final   Report Status 01/08/2016 FINAL  Final  Blood Culture ID Panel  (Reflexed)     Status: Abnormal   Collection Time: 01/03/16  1:45 PM  Result Value Ref Range Status   Enterococcus species NOT DETECTED NOT DETECTED Final   Vancomycin resistance NOT DETECTED NOT DETECTED Final   Listeria monocytogenes NOT DETECTED NOT DETECTED Final   Staphylococcus species NOT DETECTED NOT DETECTED Final   Staphylococcus aureus NOT DETECTED NOT DETECTED Final   Methicillin resistance NOT DETECTED NOT DETECTED Final   Streptococcus species NOT DETECTED NOT DETECTED Final   Streptococcus agalactiae NOT DETECTED NOT DETECTED Final   Streptococcus pneumoniae NOT DETECTED NOT DETECTED Final   Streptococcus pyogenes NOT DETECTED NOT DETECTED Final   Acinetobacter baumannii NOT DETECTED NOT DETECTED Final   Enterobacteriaceae species NOT DETECTED NOT DETECTED Final   Enterobacter cloacae complex NOT DETECTED NOT DETECTED Final   Escherichia coli NOT DETECTED NOT DETECTED Final   Klebsiella oxytoca NOT DETECTED NOT DETECTED Final   Klebsiella pneumoniae NOT DETECTED NOT DETECTED Final   Proteus species NOT DETECTED NOT DETECTED Final   Serratia marcescens NOT DETECTED NOT DETECTED Final   Carbapenem resistance NOT DETECTED NOT DETECTED Final   Haemophilus influenzae NOT DETECTED NOT DETECTED Final   Neisseria meningitidis DETECTED (A) NOT DETECTED Final    Comment: CALLED TO MELISSA MACCIA AT 1938 01/04/16 MLZ    Pseudomonas aeruginosa NOT DETECTED NOT DETECTED Final   Candida albicans NOT DETECTED NOT DETECTED Final   Candida glabrata NOT DETECTED NOT DETECTED Final   Candida krusei NOT DETECTED NOT DETECTED Final   Candida parapsilosis NOT DETECTED NOT DETECTED Final   Candida tropicalis NOT DETECTED NOT DETECTED Final  MRSA PCR Screening     Status: None   Collection Time: 01/04/16  4:00 PM  Result Value Ref Range Status   MRSA by PCR NEGATIVE NEGATIVE Final    Comment:        The GeneXpert MRSA Assay (FDA approved for NASAL specimens only), is one component of  a comprehensive MRSA colonization surveillance program. It is not intended to diagnose MRSA infection nor to guide or monitor treatment for MRSA infections.   Culture, expectorated sputum-assessment     Status: None   Collection Time: 01/05/16  9:05 AM  Result Value Ref Range Status   Specimen Description EXPECTORATED SPUTUM  Final   Special Requests NONE  Final   Sputum evaluation THIS SPECIMEN IS ACCEPTABLE FOR SPUTUM CULTURE  Final   Report Status 01/05/2016 FINAL  Final  Culture, respiratory (NON-Expectorated)     Status: None (Preliminary result)   Collection Time: 01/05/16  9:05 AM  Result Value Ref Range Status   Specimen Description EXPECTORATED SPUTUM  Final  Special Requests NONE Reflexed from [id number]  Final   Gram Stain   Final    FEW WBC SEEN FEW GRAM POSITIVE COCCI IN PAIRS FEW GRAM NEGATIVE RODS GOOD SPECIMEN - 80-90% WBCS    Culture HOLDING FOR POSSIBLE PATHOGEN  Final   Report Status PENDING  Incomplete    Studies/Results: Dg Chest 1 View  01/04/2016  CLINICAL DATA:  Central line placement. EXAM: CHEST 1 VIEW COMPARISON:  Chest x-rays dated 01/03/2016, 12/14/2015 and 03/20/2015. FINDINGS: The right upper lobe consolidation is increasingly dense on today's exam, again compatible with pneumonia. There is a stable mild prominence of the interstitial markings within each lung. Right IJ central line has been placed with tip well positioned at the level of the lower SVC. No pneumothorax. IMPRESSION: 1. Right IJ central line placement. Tip of the line appears well positioned at the level of the lower SVC. No pneumothorax or other complicating feature identified. 2. Right upper lobe pneumonia, increased in density compared to yesterday's chest x-ray. Electronically Signed   By: Franki Cabot M.D.   On: 01/04/2016 17:09   Dg Chest 2 View  01/03/2016  CLINICAL DATA:  Patient states that he has had worsening cough over past 2 weeks which has worsened today. Patient now has  right side chest pain, SOB, diarrhea, and nausea. EXAM: CHEST  2 VIEW COMPARISON:  None. FINDINGS: There is right upper lobe consolidation. There is bilateral chronic interstitial lung disease. There is no pleural effusion or pneumothorax. The heart and mediastinal contours are unremarkable. The osseous structures are unremarkable. IMPRESSION: Right upper lobe pneumonia. Followup PA and lateral chest X-ray is recommended in 3-4 weeks following trial of antibiotic therapy to ensure resolution and exclude underlying malignancy. Electronically Signed   By: Kathreen Devoid   On: 01/03/2016 12:52   Dg Chest 2 View  12/14/2015  CLINICAL DATA:  Cough, shortness of breath. EXAM: CHEST  2 VIEW COMPARISON:  November 24, 2015. FINDINGS: Stable cardiomediastinal silhouette. No pneumothorax or pleural effusion is noted. Mildly increased interstitial densities are noted in both lung bases with possible Kerley B-lines seen on the right concerning for minimal to mild pulmonary edema. Bony thorax is unremarkable. IMPRESSION: Mildly increased bibasilar interstitial densities are noted concerning for possible pulmonary edema. Electronically Signed   By: Marijo Conception, M.D.   On: 12/14/2015 11:02   US Abdomen Limited Ruq  01/06/2016  CLINICAL DATA:  Jaundice. EXAM: US ABDOMEN LIMITED - RIGHT UPPER QUADRANT COMPARISON:  Multiple exams, including 11/19/2012 FINDINGS: Gallbladder: Gallbladder wall thickening at 5 mm. Small amount of pericholecystic fluid and perihepatic fluid. The dependent non shadowing filling defect in the gallbladder compatible with sludge. No visualized gallstones. Common bile duct: Diameter: 5 mm.  No directly visualized choledocholithiasis. Liver: No focal lesion identified. Coarse echogenic liver with poor sonic penetration compatible with diffuse hepatic steatosis. Incidentally, the right kidney appears to be slightly more echogenic than the liver. IMPRESSION: 1. Gallbladder wall thickening with pericholecystic  fluid. Sludge in the gallbladder without sonographic Murphy sign or gallstones. Correlate clinically in assessing for acute cholecystitis. 2. Small amount of perihepatic ascites. 3. Coarse echogenic liver with poor sonic penetration compatible with diffuse hepatic steatosis. 4. Echogenic right kidney, nonspecific. Correlate with renal function is in assessing for glomerulonephritis or other potential causes. Electronically Signed   By: Van Clines M.D.   On: 01/06/2016 08:51     Assessment/Plan: Gabriel [name]. is a 45 y.o. male with COPD, CHF, Fatty liver admitted with  RUL pna with 2 weeks illness and found to have Neisseria meningitidis bacteremia (5/17) with no signs of meningitis or purpura. He has defervesce with IV abx and is relatively stable, not on pressors. Does also have elevated T bili and Cr.  HIV neg, CH50 nml 5/22 - clinically improving, still with cough  Day 6/7 of IV ceftraixone  Recommendations Ceftriaxone 2 gm IV q 24 - tomorrow will be day 7/7 IV ceftriaxone Can dc at that time but given persistent sputum can cough would give augmentin for another 7 days Will need a follow up CXR to document resolution of infiltrate.   Please ensure he has fu with PCP or pulmonary to follow up on this.  Thank you very much for the consult. Will follow with you.  [place], Gabriel [name]   01/08/2016, 3:01 PM

## 2016-01-08 NOTE — Progress Notes (Signed)
Patient: Gabriel [name]. / Admit Date: 01/03/2016 / Date of Encounter: 01/08/2016, 8:13 AM   Subjective: Continues to improve. On room air. Reports "I have my good days and my abd days." He does not elaborate further than that. No chest pain or palpitations.   Review of Systems: Review of Systems  Constitutional: Positive for weight loss and malaise/fatigue. Negative for fever, chills and diaphoresis.  HENT: Negative for congestion.   Eyes: Negative for discharge and redness.  Respiratory: Positive for cough, sputum production, shortness of breath and wheezing. Negative for hemoptysis.   Cardiovascular: Negative for chest pain, palpitations, orthopnea, claudication, leg swelling and PND.  Gastrointestinal: Negative for nausea, vomiting and abdominal pain.  Musculoskeletal: Negative for myalgias and falls.  Skin: Negative for rash.  Neurological: Positive for weakness. Negative for dizziness, sensory change, speech change, focal weakness and loss of consciousness.  Endo/Heme/Allergies: Does not bruise/bleed easily.  Psychiatric/Behavioral: The patient is not nervous/anxious.   All other systems reviewed and are negative.   Objective: Telemetry: NSR, 70's bpm, 8 beats of SVT at 8 PM on 5/21 Physical Exam: Blood pressure 153/94, pulse 62, temperature 98 F (36.7 C), temperature source Oral, resp. rate 20, height 6\' 1"  (1.854 m), weight 251 lb 8.7 oz (114.1 kg), SpO2 94 %. Body mass index is 33.19 kg/(m^2). General: Well developed, well nourished, in no acute distress. Head: Normocephalic, atraumatic, sclera non-icteric, no xanthomas, nares are without discharge. Neck: Negative for carotid bruits. JVP not elevated. Lungs: Improved breath sounds bilaterally. Breathing is unlabored. Heart: RRR S1 S2 without murmurs, rubs, or gallops.  Abdomen: Obese, soft, non-tender, non-distended with normoactive bowel sounds. No rebound/guarding. Extremities: No clubbing or cyanosis. No edema.  Distal pedal pulses are 2+ and equal bilaterally. Neuro: Alert and oriented X 3. Moves all extremities spontaneously. Psych:  Responds to questions appropriately with a normal affect.   Intake/Output Summary (Last 24 hours) at 01/08/16 0813 Last data filed at 01/08/16 [id number]  Gross per 24 hour  Intake    480 ml  Output   1400 ml  Net   -920 ml    Inpatient Medications:  . aspirin EC  81 mg Oral Daily  . budesonide (PULMICORT) nebulizer solution  0.25 mg Nebulization BID  . carvedilol  12.5 mg Oral BID WC  . cefTRIAXone (ROCEPHIN)  IV  2 g Intravenous Q24H  . heparin subcutaneous  5,000 Units Subcutaneous Q8H  . hydrALAZINE  25 mg Oral Q8H  . isosorbide mononitrate  30 mg Oral Daily  . nicotine  21 mg Transdermal Daily  . simvastatin  40 mg Oral QPM  . sodium chloride flush  3 mL Intravenous Q12H  . tamsulosin  0.4 mg Oral Daily   Infusions:    Labs:  Recent Labs  01/07/16 0446 01/08/16 0527  NA 133* 134*  K 4.0 4.1  CL 102 103  CO2 23 24  GLUCOSE 116* 107*  BUN 72* 58*  CREATININE 3.00* 2.41*  CALCIUM 8.2* 8.3*    Recent Labs  01/06/16 0450 01/08/16 0527  AST 162* 84*  ALT 178* 147*  ALKPHOS 46 67  BILITOT 2.8* 1.8*  PROT 6.0* 5.9*  ALBUMIN 2.7* 2.5*    Recent Labs  01/06/16 0450 01/07/16 0446 01/08/16 0527  WBC 16.2* 13.6* 11.4*  NEUTROABS 13.3*  --   --   HGB 13.3 13.5 13.2  HCT 40.7 41.3 39.6*  MCV 83.4 82.6 85.0  PLT 155 160 163   No results for input(s):  CKTOTAL, CKMB, TROPONINI in the last 72 hours. Invalid input(s): POCBNP No results for input(s): HGBA1C in the last 72 hours.   Weights: Filed Weights   01/03/16 1202 01/03/16 1559 01/04/16 1700  Weight: 225 lb (102.059 kg) 241 lb (109.317 kg) 251 lb 8.7 oz (114.1 kg)     Radiology/Studies:  Dg Chest 1 View  01/04/2016  CLINICAL DATA:  Central line placement. EXAM: CHEST 1 VIEW COMPARISON:  Chest x-rays dated 01/03/2016, 12/14/2015 and 03/20/2015. FINDINGS: The right upper lobe  consolidation is increasingly dense on today's exam, again compatible with pneumonia. There is a stable mild prominence of the interstitial markings within each lung. Right IJ central line has been placed with tip well positioned at the level of the lower SVC. No pneumothorax. IMPRESSION: 1. Right IJ central line placement. Tip of the line appears well positioned at the level of the lower SVC. No pneumothorax or other complicating feature identified. 2. Right upper lobe pneumonia, increased in density compared to yesterday's chest x-ray. Electronically Signed   By: Franki Cabot M.D.   On: 01/04/2016 17:09   Dg Chest 2 View  01/03/2016  CLINICAL DATA:  Patient states that he has had worsening cough over past 2 weeks which has worsened today. Patient now has right side chest pain, SOB, diarrhea, and nausea. EXAM: CHEST  2 VIEW COMPARISON:  None. FINDINGS: There is right upper lobe consolidation. There is bilateral chronic interstitial lung disease. There is no pleural effusion or pneumothorax. The heart and mediastinal contours are unremarkable. The osseous structures are unremarkable. IMPRESSION: Right upper lobe pneumonia. Followup PA and lateral chest X-ray is recommended in 3-4 weeks following trial of antibiotic therapy to ensure resolution and exclude underlying malignancy. Electronically Signed   By: Kathreen Devoid   On: 01/03/2016 12:52   Dg Chest 2 View  12/14/2015  CLINICAL DATA:  Cough, shortness of breath. EXAM: CHEST  2 VIEW COMPARISON:  November 24, 2015. FINDINGS: Stable cardiomediastinal silhouette. No pneumothorax or pleural effusion is noted. Mildly increased interstitial densities are noted in both lung bases with possible Kerley B-lines seen on the right concerning for minimal to mild pulmonary edema. Bony thorax is unremarkable. IMPRESSION: Mildly increased bibasilar interstitial densities are noted concerning for possible pulmonary edema. Electronically Signed   By: Marijo Conception, M.D.   On:  12/14/2015 11:02   US Abdomen Limited Ruq  01/06/2016  CLINICAL DATA:  Jaundice. EXAM: US ABDOMEN LIMITED - RIGHT UPPER QUADRANT COMPARISON:  Multiple exams, including 11/19/2012 FINDINGS: Gallbladder: Gallbladder wall thickening at 5 mm. Small amount of pericholecystic fluid and perihepatic fluid. The dependent non shadowing filling defect in the gallbladder compatible with sludge. No visualized gallstones. Common bile duct: Diameter: 5 mm.  No directly visualized choledocholithiasis. Liver: No focal lesion identified. Coarse echogenic liver with poor sonic penetration compatible with diffuse hepatic steatosis. Incidentally, the right kidney appears to be slightly more echogenic than the liver. IMPRESSION: 1. Gallbladder wall thickening with pericholecystic fluid. Sludge in the gallbladder without sonographic Murphy sign or gallstones. Correlate clinically in assessing for acute cholecystitis. 2. Small amount of perihepatic ascites. 3. Coarse echogenic liver with poor sonic penetration compatible with diffuse hepatic steatosis. 4. Echogenic right kidney, nonspecific. Correlate with renal function is in assessing for glomerulonephritis or other potential causes. Electronically Signed   By: Van Clines M.D.   On: 01/06/2016 08:51     Assessment and Plan   1. SVT: -Improved since restarting Coreg on 5/21 -Heart rate currently precludes further  titration  -Asymptomatic -Continue to monitor -If he has further recurrences could consider SVT ablation as an outpatient  2. Acute systolic CHF: -He does not appear to be volume overloaded at this time -Coreg has been restarted as above on 5/21 -Not currently on lisinopril given acute renal failure, would restart when renal function returns to baseline -Echo as above -CHF education needed  3. Neisseria meningitis PNA/septic shock/bacteremia: -Improved -Per IM and ID  4. COPD/tobacco abuse: -Cessation advised -Per IM  5. HTN: -Previously  hypotensive in the setting of #3 leading to the holding of his antihypertensives  -BP has now improved to the [id number] to [id number] systolic -Back on Coreg, heart rate precludes further titration at this time -On Imdur and hydralazine, will increase hydralazine in an effort to improve BP  6. HLD: -Simvastatin  -LDL 46  7. DM2: -Per IM   Signed, Christell Faith, PA-C Pager: [phone] 01/08/2016, 8:13 AM

## 2016-01-09 ENCOUNTER — Telehealth: Payer: Self-pay

## 2016-01-09 ENCOUNTER — Other Ambulatory Visit: Payer: Self-pay

## 2016-01-09 DIAGNOSIS — I428 Other cardiomyopathies: Secondary | ICD-10-CM

## 2016-01-09 DIAGNOSIS — I4729 Other ventricular tachycardia: Secondary | ICD-10-CM | POA: Insufficient documentation

## 2016-01-09 DIAGNOSIS — I472 Ventricular tachycardia: Secondary | ICD-10-CM

## 2016-01-09 DIAGNOSIS — I471 Supraventricular tachycardia: Secondary | ICD-10-CM

## 2016-01-09 DIAGNOSIS — J189 Pneumonia, unspecified organism: Secondary | ICD-10-CM

## 2016-01-09 LAB — BASIC METABOLIC PANEL
Anion gap: 7 (ref 5–15)
BUN: 53 mg/dL — ABNORMAL HIGH (ref 6–20)
CO2: 30 mmol/L (ref 22–32)
Calcium: 8.5 mg/dL — ABNORMAL LOW (ref 8.9–10.3)
Chloride: 102 mmol/L (ref 101–111)
Creatinine, Ser: 2.24 mg/dL — ABNORMAL HIGH (ref 0.61–1.24)
GFR calc Af Amer: 39 mL/min — ABNORMAL LOW (ref >60)
GFR calc non Af Amer: 33 mL/min — ABNORMAL LOW (ref >60)
Glucose, Bld: 124 mg/dL — ABNORMAL HIGH (ref 65–99)
Potassium: 4.4 mmol/L (ref 3.5–5.1)
Sodium: 139 mmol/L (ref 135–145)

## 2016-01-09 LAB — CULTURE, RESPIRATORY W GRAM STAIN: Culture: NORMAL

## 2016-01-09 LAB — CULTURE, RESPIRATORY

## 2016-01-09 LAB — MAGNESIUM: Magnesium: 1.8 mg/dL (ref 1.7–2.4)

## 2016-01-09 MED ORDER — AMOXICILLIN-POT CLAVULANATE 875-125 MG PO TABS: 1.0000 | ORAL_TABLET | Freq: Two times a day (BID) | ORAL | Status: DC

## 2016-01-09 MED ORDER — ISOSORBIDE MONONITRATE ER 30 MG PO TB24: 30.0000 mg | ORAL_TABLET | Freq: Every day | ORAL | Status: DC

## 2016-01-09 MED ORDER — FUROSEMIDE 40 MG PO TABS: 40.0000 mg | ORAL_TABLET | ORAL | Status: DC

## 2016-01-09 MED ORDER — TAMSULOSIN HCL 0.4 MG PO CAPS: 0.4000 mg | ORAL_CAPSULE | Freq: Every day | ORAL | Status: DC

## 2016-01-09 MED ORDER — GUAIFENESIN-CODEINE 100-10 MG/5ML PO SOLN: 10.0000 mL | ORAL | Status: DC | PRN

## 2016-01-09 MED ORDER — HYDRALAZINE HCL 50 MG PO TABS: 50.0000 mg | ORAL_TABLET | Freq: Three times a day (TID) | ORAL | Status: DC

## 2016-01-09 NOTE — Progress Notes (Signed)
Gabriel [name] was admitted to the Hospital on 01/03/2016 and Discharged  01/09/2016 and should be excused from work/school starting 01/03/2016 until cleared by his PCP or cardiologist. Demetrios Loll M.D on 01/09/2016,at 10:41 AM  [place] at [facility]  [phone]

## 2016-01-09 NOTE — Telephone Encounter (Signed)
-----   Message from Rise Mu, PA-C sent at 01/09/2016 10:33 AM EDT ----- Regarding: event monitor Hey, can you get this guy set up for a 30 day event monitor? He is getting a 48 hour Holter to bridge until he goes home as he is being discharged today.   Dx: NSVT, SVT, NICM  Thanks!  Ryan

## 2016-01-09 NOTE — Discharge Summary (Signed)
[place] at [place] NAME: Gabriel [name]    MR#:  [id number]  DATE OF BIRTH:  [birth year]  DATE OF ADMISSION:  01/03/2016 ADMITTING PHYSICIAN: Lytle Butte, MD  DATE OF DISCHARGE: 01/09/2016 11:54 AM  PRIMARY CARE PHYSICIAN: No PCP Per Patient    ADMISSION DIAGNOSIS:  CAP (community acquired pneumonia) [J18.9] Sepsis, due to unspecified organism ([place]) [A41.9]   DISCHARGE DIAGNOSIS:  Neisseria meningitidis bacteremia due to right upper lobe pneumonia with severe sepsis Acute renal failure due to ATN from sepsis/pneumonia and hypotension. abnormal iver function tests due to shock liver from sepsis. Hyperbilirubinemia SECONDARY DIAGNOSIS:   Past Medical History  Diagnosis Date  . Hypertension     Resolved since weight loss  . Obesity (BMI 30.0-34.9)   . Psoriasis   . Diabetes mellitus without complication ([place])     Pre diabetic  . CHF (congestive heart failure) ([place])   . COPD (chronic obstructive pulmonary disease) [facility])     HOSPITAL COURSE:   45 year old male with nonischemic cardiomyopathy EF of 45-50% in 2016, COPD with ongoing tobacco abuse and obesity who presented with sepsis in the setting of right upper lobe pneumonia.  * Neisseria meningitidis bacteremia due to right upper lobe pneumonia with severe sepsis No signs of meningitis. No purpura. discontinued droplet isolation per Dr. Ola Spurr On ceftriaxone 2 g every 12 hours, completed 7 days of IV ceftriaxone, Can dc at that time but given persistent sputum can cough would give augmentin for another 7 days Will need a follow up CXR to document resolution of infiltrate per Dr. Ola Spurr . Leukocytosis is improved.  * Acute renal failure due to ATN from sepsis/pneumonia and hypotension.. Patient has ejection fraction 20-25%. Need to be very cautious with fluids. holding outpatient lisinopril and potassium. Cr. Down to 2.24. Improving. Follow-up BMP as  outpatient.  * Hyponatremia:  Improved.  * Hyperbilirubinemia. Better. Cr. 1.8. This seems chronic.   * abnormal iver function tests due to shock liver from sepsis. Improving.  * Elevated troponin: Troponin peaked at 1.05 and now is trended down. This is likely due to demand ischemia from problem #1 Diagonal cardiology consult. At some point patient may need cardiac catheterization as per CARDIOLOGY consult. Continue ASA, statin  * Chronic systolic CHF, Echo showed EF 20-25% History of mild pulmonary edema on chest x-ray. He also has sepsis and acute renal failure. stopped IV fluids. Lasix was on hold. Started Lasix yesterday per cardiologist. Continue Lasix 40 mg every other day and as needed in between per [name].  * History of essential hypertension: All blood pressure medications were on hold due to hypotension. Resumed coreg due to elevated BP. But hold lisinorpil and lasix.Started IV hydralazine when necessary. Continue Coreg and Lasix.  * Back spasm: Flexeril PRN  * tobacco dependence: Counseled by admitting physician On nicotine patch I discussed with Dr. Holley Raring, Dr. Ola Spurr and [name]. DISCHARGE CONDITIONS:   Stable, discharged to home today.  CONSULTS OBTAINED:  Treatment Team:  Lytle Butte, MD Wellington Hampshire, MD  DRUG ALLERGIES:   Allergies  Allergen Reactions  . Prednisone Other (See Comments)    Reaction: Hallucinations     DISCHARGE MEDICATIONS:   Discharge Medication List as of 01/09/2016 11:34 AM    START taking these medications   Details  amoxicillin-clavulanate (AUGMENTIN) 875-125 MG tablet Take 1 tablet by mouth 2 (two) times daily., Starting 01/09/2016, Until Discontinued, Print    guaiFENesin-codeine 100-10 MG/5ML  syrup Take 10 mLs by mouth every 4 (four) hours as needed for cough., Starting 01/09/2016, Until Discontinued, Print    hydrALAZINE (APRESOLINE) 50 MG tablet Take 1 tablet (50 mg total) by mouth every 8 (eight)  hours., Starting 01/09/2016, Until Discontinued, Print    isosorbide mononitrate (IMDUR) 30 MG 24 hr tablet Take 1 tablet (30 mg total) by mouth daily., Starting 01/09/2016, Until Discontinued, Print    tamsulosin (FLOMAX) 0.4 MG CAPS capsule Take 1 capsule (0.4 mg total) by mouth daily., Starting 01/09/2016, Until Discontinued, Print      CONTINUE these medications which have CHANGED   Details  furosemide (LASIX) 40 MG tablet Take 1 tablet (40 mg total) by mouth every other day., Starting 01/09/2016, Until Wed 01/08/17, Print      CONTINUE these medications which have NOT CHANGED   Details  albuterol (PROVENTIL HFA;VENTOLIN HFA) 108 (90 Base) MCG/ACT inhaler Inhale 2 puffs into the lungs every 6 (six) hours as needed for wheezing or shortness of breath., Starting 12/14/2015, Until Discontinued, Print    aspirin EC 81 MG tablet Take 1 tablet (81 mg total) by mouth daily., Starting 12/14/2015, Until Fri 12/13/16, Print    carvedilol (COREG) 12.5 MG tablet Take 1 tablet (12.5 mg total) by mouth 2 (two) times daily with a meal., Starting 12/14/2015, Until Discontinued, Print    simvastatin (ZOCOR) 40 MG tablet Take 1 tablet (40 mg total) by mouth every evening., Starting 11/24/2015, Until Sat 11/23/16, Print      STOP taking these medications     lisinopril (PRINIVIL,ZESTRIL) 20 MG tablet      potassium chloride (K-DUR) 10 MEQ tablet          DISCHARGE INSTRUCTIONS:    If you experience worsening of your admission symptoms, develop shortness of breath, life threatening emergency, suicidal or homicidal thoughts you must seek medical attention immediately by calling 911 or calling your MD immediately  if symptoms less severe.  You Must read complete instructions/literature along with all the possible adverse reactions/side effects for all the Medicines you take and that have been prescribed to you. Take any new Medicines after you have completely understood and accept all the possible adverse  reactions/side effects.   Please note  You were cared for by a hospitalist during your hospital stay. If you have any questions about your discharge medications or the care you received while you were in the hospital after you are discharged, you can call the unit and asked to speak with the hospitalist on call if the hospitalist that took care of you is not available. Once you are discharged, your primary care physician will handle any further medical issues. Please note that NO REFILLS for any discharge medications will be authorized once you are discharged, as it is imperative that you return to your primary care physician (or establish a relationship with a primary care physician if you do not have one) for your aftercare needs so that they can reassess your need for medications and monitor your lab values.    Today   SUBJECTIVE    No complaint.  VITAL SIGNS:  Blood pressure 177/106, pulse 68, temperature 98.1 F (36.7 C), temperature source Oral, resp. rate 18, height 6\' 1"  (1.854 m), weight 114.1 kg (251 lb 8.7 oz), SpO2 92 %.  I/O:   Intake/Output Summary (Last 24 hours) at 01/09/16 1707 Last data filed at 01/09/16 1004  Gross per 24 hour  Intake    220 ml  Output  0 ml  Net    220 ml    PHYSICAL EXAMINATION:  GENERAL:  45 y.o.-year-old patient lying in the bed with no acute distress. Morbidly obese. EYES: Pupils equal, round, reactive to light and accommodation. No scleral icterus. Extraocular muscles intact.  HEENT: Head atraumatic, normocephalic. Oropharynx and nasopharynx clear.  NECK:  Supple, no jugular venous distention. No thyroid enlargement, no tenderness.  LUNGS: Normal breath sounds bilaterally, no wheezing, rales,rhonchi or crepitation. No use of accessory muscles of respiration.  CARDIOVASCULAR: S1, S2 normal. No murmurs, rubs, or gallops.  ABDOMEN: Soft, non-tender, non-distended. Bowel sounds present. No organomegaly or mass.  EXTREMITIES: No pedal  edema, cyanosis, or clubbing.  NEUROLOGIC: Cranial nerves II through XII are intact. Muscle strength 5/5 in all extremities. Sensation intact. Gait not checked.  PSYCHIATRIC: The patient is alert and oriented x 3.  SKIN: No obvious rash, lesion, or ulcer.   DATA REVIEW:   CBC  Recent Labs Lab 01/08/16 0527  WBC 11.4*  HGB 13.2  HCT 39.6*  PLT 163    Chemistries   Recent Labs Lab 01/08/16 0527 01/09/16 0430  NA 134* 139  K 4.1 4.4  CL 103 102  CO2 24 30  GLUCOSE 107* 124*  BUN 58* 53*  CREATININE 2.41* 2.24*  CALCIUM 8.3* 8.5*  MG  --  1.8  AST 84*  --   ALT 147*  --   ALKPHOS 67  --   BILITOT 1.8*  --     Cardiac Enzymes  Recent Labs Lab 01/04/16 2020  TROPONINI 0.53*    Microbiology Results  Results for orders placed or performed during the hospital encounter of 01/03/16  Urine culture     Status: None   Collection Time: 01/03/16  1:32 PM  Result Value Ref Range Status   Specimen Description URINE, RANDOM  Final   Special Requests NONE  Final   Culture NO GROWTH Performed at [facility]   Final   Report Status 01/04/2016 FINAL  Final  Blood Culture (routine x 2)     Status: None   Collection Time: 01/03/16  1:45 PM  Result Value Ref Range Status   Specimen Description BLOOD RIGHT ARM  Final   Special Requests BOTTLES DRAWN AEROBIC AND ANAEROBIC [place]  Final   Culture NO GROWTH 5 DAYS  Final   Report Status 01/08/2016 FINAL  Final  Blood Culture (routine x 2)     Status: Abnormal   Collection Time: 01/03/16  1:45 PM  Result Value Ref Range Status   Specimen Description BLOOD LEFT ASSIST CONTROL  Final   Special Requests BOTTLES DRAWN AEROBIC AND ANAEROBIC [place]  Final   Culture  Setup Time (A)  Final    GRAM NEGATIVE DIPLOCOCCI AEROBIC BOTTLE ONLY CRITICAL RESULT CALLED TO, READ BACK BY AND VERIFIED WITH: MELISSA Chilchinbito AT 1938 01/04/16 MLZ  CONFIRMED BY MSS    Culture NEISSERIA MENINGITIDIS BETA LACTAMASE  NEGATIVE  (A)  Final   Report Status 01/08/2016 FINAL  Final  Blood Culture ID Panel (Reflexed)     Status: Abnormal   Collection Time: 01/03/16  1:45 PM  Result Value Ref Range Status   Enterococcus species NOT DETECTED NOT DETECTED Final   Vancomycin resistance NOT DETECTED NOT DETECTED Final   Listeria monocytogenes NOT DETECTED NOT DETECTED Final   Staphylococcus species NOT DETECTED NOT DETECTED Final   Staphylococcus aureus NOT DETECTED NOT DETECTED Final   Methicillin resistance NOT DETECTED NOT DETECTED Final   Streptococcus  species NOT DETECTED NOT DETECTED Final   Streptococcus agalactiae NOT DETECTED NOT DETECTED Final   Streptococcus pneumoniae NOT DETECTED NOT DETECTED Final   Streptococcus pyogenes NOT DETECTED NOT DETECTED Final   Acinetobacter baumannii NOT DETECTED NOT DETECTED Final   Enterobacteriaceae species NOT DETECTED NOT DETECTED Final   Enterobacter cloacae complex NOT DETECTED NOT DETECTED Final   Escherichia coli NOT DETECTED NOT DETECTED Final   Klebsiella oxytoca NOT DETECTED NOT DETECTED Final   Klebsiella pneumoniae NOT DETECTED NOT DETECTED Final   Proteus species NOT DETECTED NOT DETECTED Final   Serratia marcescens NOT DETECTED NOT DETECTED Final   Carbapenem resistance NOT DETECTED NOT DETECTED Final   Haemophilus influenzae NOT DETECTED NOT DETECTED Final   Neisseria meningitidis DETECTED (A) NOT DETECTED Final    Comment: CALLED TO MELISSA MACCIA AT 1938 01/04/16 MLZ    Pseudomonas aeruginosa NOT DETECTED NOT DETECTED Final   Candida albicans NOT DETECTED NOT DETECTED Final   Candida glabrata NOT DETECTED NOT DETECTED Final   Candida krusei NOT DETECTED NOT DETECTED Final   Candida parapsilosis NOT DETECTED NOT DETECTED Final   Candida tropicalis NOT DETECTED NOT DETECTED Final  MRSA PCR Screening     Status: None   Collection Time: 01/04/16  4:00 PM  Result Value Ref Range Status   MRSA by PCR NEGATIVE NEGATIVE Final    Comment:        The  GeneXpert MRSA Assay (FDA approved for NASAL specimens only), is one component of a comprehensive MRSA colonization surveillance program. It is not intended to diagnose MRSA infection nor to guide or monitor treatment for MRSA infections.   Culture, expectorated sputum-assessment     Status: None   Collection Time: 01/05/16  9:05 AM  Result Value Ref Range Status   Specimen Description EXPECTORATED SPUTUM  Final   Special Requests NONE  Final   Sputum evaluation THIS SPECIMEN IS ACCEPTABLE FOR SPUTUM CULTURE  Final   Report Status 01/05/2016 FINAL  Final  Culture, respiratory (NON-Expectorated)     Status: None   Collection Time: 01/05/16  9:05 AM  Result Value Ref Range Status   Specimen Description EXPECTORATED SPUTUM  Final   Special Requests NONE Reflexed from [id number]  Final   Gram Stain   Final    FEW WBC SEEN FEW GRAM POSITIVE COCCI IN PAIRS FEW GRAM NEGATIVE RODS GOOD SPECIMEN - 80-90% WBCS    Culture Consistent with normal respiratory flora.  Final   Report Status 01/09/2016 FINAL  Final    RADIOLOGY:  No results found.      Management plans discussed with the patient, His father and they are in agreement.  CODE STATUS:  Code Status History    Date Active Date Inactive Code Status Order ID Comments User Context   01/03/2016  2:22 PM 01/09/2016  2:54 PM Full Code [id number]  Lytle Butte, MD ED   05/17/2015 11:41 PM 05/19/2015  5:31 PM Full Code [id number]  Lance Coon, MD Inpatient   03/20/2015  8:26 AM 03/21/2015  3:27 PM Full Code [id number]  Harrie Foreman, MD Inpatient      TOTAL TIME TAKING CARE OF THIS PATIENT: 36 minutes.    Demetrios Loll M.D on 01/09/2016 at 5:07 PM  Between 7am to 6pm - Pager - [phone]  After 6pm go to [email or web address] - password [email or web address] [facility]  [facility]  Office  [phone]  CC: Primary care physician; No PCP Per Patient

## 2016-01-09 NOTE — Discharge Instructions (Signed)
Heart healthy diet. °Activity as tolerated. °

## 2016-01-09 NOTE — Progress Notes (Signed)
Pt had an 11 run of V-tach this morning, MD Marcille Blanco aware.

## 2016-01-09 NOTE — Progress Notes (Signed)
Patient: Gabriel [name]. / Admit Date: 01/03/2016 / Date of Encounter: 01/09/2016, 8:03 AM   Subjective: Feels good this morning. Breathing is improving. Ambulated in his room and the hallway without dyspnea on 5/22. No chest pain. Renal function continues to improve. 2.24 today. Had 11 beat run ov NSVT overnight. Mg++ 1.8. Received PO Lasix 40 mg x 1 on 5/22. Still net positive fluids for the admission.   Review of Systems: Review of Systems  Constitutional: Positive for weight loss and malaise/fatigue. Negative for fever, chills and diaphoresis.  HENT: Negative for congestion.   Eyes: Negative for discharge and redness.  Respiratory: Positive for cough, sputum production, shortness of breath and wheezing. Negative for hemoptysis.   Cardiovascular: Negative for chest pain, palpitations, orthopnea, claudication, leg swelling and PND.  Gastrointestinal: Negative for heartburn, nausea, vomiting and abdominal pain.  Musculoskeletal: Negative for falls.  Skin: Negative for rash.  Neurological: Positive for weakness. Negative for dizziness, sensory change, speech change, focal weakness and loss of consciousness.  Endo/Heme/Allergies: Does not bruise/bleed easily.  Psychiatric/Behavioral: The patient is not nervous/anxious.   All other systems reviewed and are negative.    Objective: Telemetry: NSR, 60's bpm, 11 beats of NSVT at 5 AM Physical Exam: Blood pressure 159/96, pulse 70, temperature 98.1 F (36.7 C), temperature source Oral, resp. rate 18, height 6\' 1"  (1.854 m), weight 251 lb 8.7 oz (114.1 kg), SpO2 92 %. Body mass index is 33.19 kg/(m^2). General: Well developed, well nourished, in no acute distress. Head: Normocephalic, atraumatic, sclera non-icteric, no xanthomas, nares are without discharge. Neck: Negative for carotid bruits. JVP not elevated. Lungs: Clear bilaterally to auscultation without wheezes, rales, or rhonchi. Breathing is unlabored. Heart: RRR S1 S2 without  murmurs, rubs, or gallops.  Abdomen: Soft, non-tender, non-distended with normoactive bowel sounds. No rebound/guarding. Extremities: No clubbing or cyanosis. Trace  Bilateral pre-tibial edema. Distal pedal pulses are 2+ and equal bilaterally. Neuro: Alert and oriented X 3. Moves all extremities spontaneously. Psych:  Responds to questions appropriately with a normal affect.   Intake/Output Summary (Last 24 hours) at 01/09/16 0803 Last data filed at 01/09/16 0439  Gross per 24 hour  Intake    580 ml  Output      0 ml  Net    580 ml    Inpatient Medications:  . aspirin EC  81 mg Oral Daily  . budesonide (PULMICORT) nebulizer solution  0.25 mg Nebulization BID  . carvedilol  12.5 mg Oral BID WC  . cefTRIAXone (ROCEPHIN)  IV  2 g Intravenous Q24H  . furosemide  40 mg Oral Daily  . heparin subcutaneous  5,000 Units Subcutaneous Q8H  . hydrALAZINE  50 mg Oral Q8H  . isosorbide mononitrate  30 mg Oral Daily  . nicotine  21 mg Transdermal Daily  . simvastatin  40 mg Oral QPM  . sodium chloride flush  3 mL Intravenous Q12H  . tamsulosin  0.4 mg Oral Daily   Infusions:    Labs:  Recent Labs  01/08/16 0527 01/09/16 0430  NA 134* 139  K 4.1 4.4  CL 103 102  CO2 24 30  GLUCOSE 107* 124*  BUN 58* 53*  CREATININE 2.41* 2.24*  CALCIUM 8.3* 8.5*  MG  --  1.8    Recent Labs  01/08/16 0527  AST 84*  ALT 147*  ALKPHOS 67  BILITOT 1.8*  PROT 5.9*  ALBUMIN 2.5*    Recent Labs  01/07/16 0446 01/08/16 0527  WBC  13.6* 11.4*  HGB 13.5 13.2  HCT 41.3 39.6*  MCV 82.6 85.0  PLT 160 163   No results for input(s): CKTOTAL, CKMB, TROPONINI in the last 72 hours. Invalid input(s): POCBNP No results for input(s): HGBA1C in the last 72 hours.   Weights: Filed Weights   01/03/16 1202 01/03/16 1559 01/04/16 1700  Weight: 225 lb (102.059 kg) 241 lb (109.317 kg) 251 lb 8.7 oz (114.1 kg)     Radiology/Studies:  Dg Chest 1 View  01/04/2016  CLINICAL DATA:  Central line  placement. EXAM: CHEST 1 VIEW COMPARISON:  Chest x-rays dated 01/03/2016, 12/14/2015 and 03/20/2015. FINDINGS: The right upper lobe consolidation is increasingly dense on today's exam, again compatible with pneumonia. There is a stable mild prominence of the interstitial markings within each lung. Right IJ central line has been placed with tip well positioned at the level of the lower SVC. No pneumothorax. IMPRESSION: 1. Right IJ central line placement. Tip of the line appears well positioned at the level of the lower SVC. No pneumothorax or other complicating feature identified. 2. Right upper lobe pneumonia, increased in density compared to yesterday's chest x-ray. Electronically Signed   By: Franki Cabot M.D.   On: 01/04/2016 17:09   Dg Chest 2 View  01/03/2016  CLINICAL DATA:  Patient states that he has had worsening cough over past 2 weeks which has worsened today. Patient now has right side chest pain, SOB, diarrhea, and nausea. EXAM: CHEST  2 VIEW COMPARISON:  None. FINDINGS: There is right upper lobe consolidation. There is bilateral chronic interstitial lung disease. There is no pleural effusion or pneumothorax. The heart and mediastinal contours are unremarkable. The osseous structures are unremarkable. IMPRESSION: Right upper lobe pneumonia. Followup PA and lateral chest X-ray is recommended in 3-4 weeks following trial of antibiotic therapy to ensure resolution and exclude underlying malignancy. Electronically Signed   By: Kathreen Devoid   On: 01/03/2016 12:52   Dg Chest 2 View  12/14/2015  CLINICAL DATA:  Cough, shortness of breath. EXAM: CHEST  2 VIEW COMPARISON:  November 24, 2015. FINDINGS: Stable cardiomediastinal silhouette. No pneumothorax or pleural effusion is noted. Mildly increased interstitial densities are noted in both lung bases with possible Kerley B-lines seen on the right concerning for minimal to mild pulmonary edema. Bony thorax is unremarkable. IMPRESSION: Mildly increased bibasilar  interstitial densities are noted concerning for possible pulmonary edema. Electronically Signed   By: Marijo Conception, M.D.   On: 12/14/2015 11:02   US Abdomen Limited Ruq  01/06/2016  CLINICAL DATA:  Jaundice. EXAM: US ABDOMEN LIMITED - RIGHT UPPER QUADRANT COMPARISON:  Multiple exams, including 11/19/2012 FINDINGS: Gallbladder: Gallbladder wall thickening at 5 mm. Small amount of pericholecystic fluid and perihepatic fluid. The dependent non shadowing filling defect in the gallbladder compatible with sludge. No visualized gallstones. Common bile duct: Diameter: 5 mm.  No directly visualized choledocholithiasis. Liver: No focal lesion identified. Coarse echogenic liver with poor sonic penetration compatible with diffuse hepatic steatosis. Incidentally, the right kidney appears to be slightly more echogenic than the liver. IMPRESSION: 1. Gallbladder wall thickening with pericholecystic fluid. Sludge in the gallbladder without sonographic Murphy sign or gallstones. Correlate clinically in assessing for acute cholecystitis. 2. Small amount of perihepatic ascites. 3. Coarse echogenic liver with poor sonic penetration compatible with diffuse hepatic steatosis. 4. Echogenic right kidney, nonspecific. Correlate with renal function is in assessing for glomerulonephritis or other potential causes. Electronically Signed   By: Van Clines M.D.   On:  01/06/2016 08:51     Assessment and Plan   1. SVT: -Resolved since restarting Coreg on 5/21 -Heart rate currently precludes further titration  -Asymptomatic -Continue to monitor -If he has further recurrences could consider SVT ablation as an outpatient  2. Acute systolic CHF: -Still mildly volume overloaded -Coreg has been restarted as above on 5/21 -Not currently on lisinopril given acute renal failure, would restart ACEi/Arb/or Entresto when renal function returns to baseline -Renal function stable on PO Lasix 40 mg daily, continue. Must be cautious  with diuresis given his ARF -Echo as above -CHF education needed  3. Neisseria meningitis PNA/septic shock/bacteremia: -Improved -Per IM and ID  4. NSVT: -11 beats this morning at 5 AM -Asymptomatic -Coreg as above -Not an ideal amiodarone candidate given his age, though if ventricular ectopy persists consider short course of amiodarone to suppress ectopy   5. COPD/tobacco abuse: -Cessation advised -Per IM  6. HTN: -Previously hypotensive in the setting of #3 leading to the holding of his antihypertensives  -BP has now improved to the Q000111Q to 99991111 systolic -Back on Coreg, heart rate precludes further titration at this time -On Imdur and hydralazine. Hydralazine was increased on 5/22 -BP has ran in the Q000111Q systolic for the most part -Increase Imdur to 60 mg daily   7. HLD: -Simvastatin  -LDL 46  8. DM2: -Per IM  9. ARF: -Improving -Likely 2/2 ATN in the setting of hypotension, sepsis, and PNA -Continue to monitor closely    Signed, Christell Faith, PA-C Pager: [phone] 01/09/2016, 8:03 AM

## 2016-01-09 NOTE — Progress Notes (Signed)
[facility]  ROUNDING NOTE   Subjective:  Renal function improving. Creatinine down to 2.2. Patient still having some pain with deep inspiration on the right.   Objective:  Vital signs in last 24 hours:  Temp:  [98.1 F (36.7 C)-98.3 F (36.8 C)] 98.1 F (36.7 C) (05/23 0436) Pulse Rate:  [67-70] 68 (05/23 0828) Resp:  [16-18] 18 (05/23 0436) BP: (136-185)/(81-109) 177/106 mmHg (05/23 0828) SpO2:  [92 %-95 %] 92 % (05/23 0436)  Weight change:  Filed Weights   01/03/16 1202 01/03/16 1559 01/04/16 1700  Weight: 102.059 kg (225 lb) 109.317 kg (241 lb) 114.1 kg (251 lb 8.7 oz)    Intake/Output: I/O last 3 completed shifts: In: 580 [P.O.:480; IV Piggyback:100] Out: 800 [Urine:800]   Intake/Output this shift:  Total I/O In: 120 [P.O.:120] Out: -   Physical Exam: General: NAD, laying in bed  Head: Normocephalic, atraumatic. Moist oral mucosal membranes  Eyes: Anicteric, PERRL  Neck: Supple, trachea midline  Lungs:  Clear to auscultation, normal effort  Heart: Regular rate and rhythm  Abdomen:  Soft, nontender, obese  Extremities: no peripheral edema.  Neurologic: Nonfocal, moving all four extremities  Skin: No lesions       Basic Metabolic Panel:  Recent Labs Lab 01/04/16 2020 01/06/16 0450 01/07/16 0446 01/08/16 0527 01/09/16 0430  NA 132* 134* 133* 134* 139  K 4.1 4.2 4.0 4.1 4.4  CL 101 102 102 103 102  CO2 23 22 23 24 30   GLUCOSE 114* 89 116* 107* 124*  BUN 46* 67* 72* 58* 53*  CREATININE 2.41* 3.17* 3.00* 2.41* 2.24*  CALCIUM 7.4* 8.1* 8.2* 8.3* 8.5*  MG  --   --   --   --  1.8    Liver Function Tests:  Recent Labs Lab 01/04/16 2020 01/06/16 0450 01/08/16 0527  AST 131* 162* 84*  ALT 96* 178* 147*  ALKPHOS 56 46 67  BILITOT 4.2* 2.8* 1.8*  PROT 6.0* 6.0* 5.9*  ALBUMIN 2.7* 2.7* 2.5*   No results for input(s): LIPASE, AMYLASE in the last 168 hours. No results for input(s): AMMONIA in the last 168 hours.  CBC:  Recent  Labs Lab 01/04/16 0418 01/05/16 0613 01/06/16 0450 01/07/16 0446 01/08/16 0527  WBC 18.0* 18.8* 16.2* 13.6* 11.4*  NEUTROABS  --   --  13.3*  --   --   HGB 13.7 12.9* 13.3 13.5 13.2  HCT 42.0 39.1* 40.7 41.3 39.6*  MCV 83.2 82.8 83.4 82.6 85.0  PLT 100* 140* 155 160 163    Cardiac Enzymes:  Recent Labs Lab 01/03/16 2132 01/04/16 0418 01/04/16 1009 01/04/16 1401 01/04/16 2020  TROPONINI 0.18* 1.05* 0.13* 0.22* 0.53*    BNP: Invalid input(s): POCBNP  CBG:  Recent Labs Lab 01/04/16 1028 01/04/16 1614  GLUCAP 213* 140*    Microbiology: Results for orders placed or performed during the hospital encounter of 01/03/16  Urine culture     Status: None   Collection Time: 01/03/16  1:32 PM  Result Value Ref Range Status   Specimen Description URINE, RANDOM  Final   Special Requests NONE  Final   Culture NO GROWTH Performed at [facility]   Final   Report Status 01/04/2016 FINAL  Final  Blood Culture (routine x 2)     Status: None   Collection Time: 01/03/16  1:45 PM  Result Value Ref Range Status   Specimen Description BLOOD RIGHT ARM  Final   Special Requests BOTTLES DRAWN AEROBIC AND ANAEROBIC  [place]  Final   Culture NO GROWTH 5 DAYS  Final   Report Status 01/08/2016 FINAL  Final  Blood Culture (routine x 2)     Status: Abnormal   Collection Time: 01/03/16  1:45 PM  Result Value Ref Range Status   Specimen Description BLOOD LEFT ASSIST CONTROL  Final   Special Requests BOTTLES DRAWN AEROBIC AND ANAEROBIC [place]  Final   Culture  Setup Time (A)  Final    GRAM NEGATIVE DIPLOCOCCI AEROBIC BOTTLE ONLY CRITICAL RESULT CALLED TO, READ BACK BY AND VERIFIED WITH: MELISSA [place] 01/04/16 MLZ  CONFIRMED BY MSS    Culture NEISSERIA MENINGITIDIS BETA LACTAMASE NEGATIVE  (A)  Final   Report Status 01/08/2016 FINAL  Final  Blood Culture ID Panel (Reflexed)     Status: Abnormal   Collection Time: 01/03/16  1:45 PM  Result Value Ref  Range Status   Enterococcus species NOT DETECTED NOT DETECTED Final   Vancomycin resistance NOT DETECTED NOT DETECTED Final   Listeria monocytogenes NOT DETECTED NOT DETECTED Final   Staphylococcus species NOT DETECTED NOT DETECTED Final   Staphylococcus aureus NOT DETECTED NOT DETECTED Final   Methicillin resistance NOT DETECTED NOT DETECTED Final   Streptococcus species NOT DETECTED NOT DETECTED Final   Streptococcus agalactiae NOT DETECTED NOT DETECTED Final   Streptococcus pneumoniae NOT DETECTED NOT DETECTED Final   Streptococcus pyogenes NOT DETECTED NOT DETECTED Final   Acinetobacter baumannii NOT DETECTED NOT DETECTED Final   Enterobacteriaceae species NOT DETECTED NOT DETECTED Final   Enterobacter cloacae complex NOT DETECTED NOT DETECTED Final   Escherichia coli NOT DETECTED NOT DETECTED Final   Klebsiella oxytoca NOT DETECTED NOT DETECTED Final   Klebsiella pneumoniae NOT DETECTED NOT DETECTED Final   Proteus species NOT DETECTED NOT DETECTED Final   Serratia marcescens NOT DETECTED NOT DETECTED Final   Carbapenem resistance NOT DETECTED NOT DETECTED Final   Haemophilus influenzae NOT DETECTED NOT DETECTED Final   Neisseria meningitidis DETECTED (A) NOT DETECTED Final    Comment: CALLED TO MELISSA MACCIA AT 1938 01/04/16 MLZ    Pseudomonas aeruginosa NOT DETECTED NOT DETECTED Final   Candida albicans NOT DETECTED NOT DETECTED Final   Candida glabrata NOT DETECTED NOT DETECTED Final   Candida krusei NOT DETECTED NOT DETECTED Final   Candida parapsilosis NOT DETECTED NOT DETECTED Final   Candida tropicalis NOT DETECTED NOT DETECTED Final  MRSA PCR Screening     Status: None   Collection Time: 01/04/16  4:00 PM  Result Value Ref Range Status   MRSA by PCR NEGATIVE NEGATIVE Final    Comment:        The GeneXpert MRSA Assay (FDA approved for NASAL specimens only), is one component of a comprehensive MRSA colonization surveillance program. It is not intended to diagnose  MRSA infection nor to guide or monitor treatment for MRSA infections.   Culture, expectorated sputum-assessment     Status: None   Collection Time: 01/05/16  9:05 AM  Result Value Ref Range Status   Specimen Description EXPECTORATED SPUTUM  Final   Special Requests NONE  Final   Sputum evaluation THIS SPECIMEN IS ACCEPTABLE FOR SPUTUM CULTURE  Final   Report Status 01/05/2016 FINAL  Final  Culture, respiratory (NON-Expectorated)     Status: None   Collection Time: 01/05/16  9:05 AM  Result Value Ref Range Status   Specimen Description EXPECTORATED SPUTUM  Final   Special Requests NONE Reflexed from [id number]  Final   Gram Stain  Final    FEW WBC SEEN FEW GRAM POSITIVE COCCI IN PAIRS FEW GRAM NEGATIVE RODS GOOD SPECIMEN - 80-90% WBCS    Culture Consistent with normal respiratory flora.  Final   Report Status 01/09/2016 FINAL  Final    Coagulation Studies: No results for input(s): LABPROT, INR in the last 72 hours.  Urinalysis: No results for input(s): COLORURINE, LABSPEC, PHURINE, GLUCOSEU, HGBUR, BILIRUBINUR, KETONESUR, PROTEINUR, UROBILINOGEN, NITRITE, LEUKOCYTESUR in the last 72 hours.  Invalid input(s): APPERANCEUR    Imaging: No results found.   Medications:     . aspirin EC  81 mg Oral Daily  . budesonide (PULMICORT) nebulizer solution  0.25 mg Nebulization BID  . carvedilol  12.5 mg Oral BID WC  . cefTRIAXone (ROCEPHIN)  IV  2 g Intravenous Q24H  . furosemide  40 mg Oral Daily  . heparin subcutaneous  5,000 Units Subcutaneous Q8H  . hydrALAZINE  50 mg Oral Q8H  . isosorbide mononitrate  30 mg Oral Daily  . nicotine  21 mg Transdermal Daily  . simvastatin  40 mg Oral QPM  . sodium chloride flush  3 mL Intravenous Q12H  . tamsulosin  0.4 mg Oral Daily   acetaminophen **OR** acetaminophen, cyclobenzaprine, guaiFENesin-codeine, hydrALAZINE, HYDROmorphone (DILAUDID) injection, ipratropium-albuterol, morphine injection, ondansetron **OR** ondansetron (ZOFRAN) IV,  oxyCODONE  Assessment/ Plan:  [name]. is a 45 y.o. white male with COPD/tobacco use, systolic congestive heart failure, hypertension, hyperlipidemia, type II diabetes mellitus type II, psoariasis, who was admitted to [facility] on 01/03/2016  1. Acute renal failure: secondary to ATN from sepsis/pneumonia and hypotension. Urine output is nonoliguric.  - Creatinine down to 2.2. Patient will need continued monitoring of renal function as an outpatient. We will set up an appointment in our office for this purpose.  2. 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  Sodium currently up to 139. Continue to monitor as an outpatient.  3. N. Meningococcal sepsis A39.4: N. Meningitides.  - Continue ceftriaxone under the instruction of infectious disease.  4. Hepatic failure: shock liver from sepsis.  - Continue to monitor liver function panel.    LOS: 6 Gabriel [name] 5/23/20171:36 PM

## 2016-01-09 NOTE — Telephone Encounter (Signed)
Order placed w/ [employer]. They will contact pt today to verify address before mailing monitor.

## 2016-01-09 NOTE — Progress Notes (Signed)
Patient received discharge instructions, pt verbalized understanding. IV was removed with no signs of infection. Dressing clean, dry intact. No skin tears or wounds present. Prescription was printed and given to patient. [employer]iter monitor was applied by Fritz Pickerel, RT. Patient verbalized understanding. Patient was escorted out with staff member via wheelchair via private auto. No further needs from care management team.

## 2016-01-15 ENCOUNTER — Other Ambulatory Visit: Payer: Self-pay | Admitting: Cardiovascular Disease

## 2016-01-15 DIAGNOSIS — I4729 Other ventricular tachycardia: Secondary | ICD-10-CM

## 2016-01-15 DIAGNOSIS — I472 Ventricular tachycardia: Secondary | ICD-10-CM

## 2016-01-17 ENCOUNTER — Encounter: Payer: Self-pay | Admitting: Cardiovascular Disease

## 2016-01-17 ENCOUNTER — Ambulatory Visit (INDEPENDENT_AMBULATORY_CARE_PROVIDER_SITE_OTHER): Payer: 59 | Admitting: Cardiovascular Disease

## 2016-01-17 VITALS — BP 120/76 | HR 71 | Ht 73.0 in | Wt 230.0 lb

## 2016-01-17 DIAGNOSIS — I5022 Chronic systolic (congestive) heart failure: Secondary | ICD-10-CM | POA: Diagnosis not present

## 2016-01-17 DIAGNOSIS — E785 Hyperlipidemia, unspecified: Secondary | ICD-10-CM | POA: Diagnosis not present

## 2016-01-17 DIAGNOSIS — Z720 Tobacco use: Secondary | ICD-10-CM

## 2016-01-17 DIAGNOSIS — I10 Essential (primary) hypertension: Secondary | ICD-10-CM | POA: Diagnosis not present

## 2016-01-17 DIAGNOSIS — R079 Chest pain, unspecified: Secondary | ICD-10-CM

## 2016-01-17 DIAGNOSIS — I471 Supraventricular tachycardia: Secondary | ICD-10-CM

## 2016-01-17 DIAGNOSIS — J189 Pneumonia, unspecified organism: Secondary | ICD-10-CM

## 2016-01-17 DIAGNOSIS — I472 Ventricular tachycardia: Secondary | ICD-10-CM

## 2016-01-17 DIAGNOSIS — IMO0001 Reserved for inherently not codable concepts without codable children: Secondary | ICD-10-CM

## 2016-01-17 DIAGNOSIS — I4729 Other ventricular tachycardia: Secondary | ICD-10-CM

## 2016-01-17 DIAGNOSIS — I4719 Other supraventricular tachycardia: Secondary | ICD-10-CM

## 2016-01-17 DIAGNOSIS — A419 Sepsis, unspecified organism: Secondary | ICD-10-CM

## 2016-01-17 DIAGNOSIS — I420 Dilated cardiomyopathy: Secondary | ICD-10-CM

## 2016-01-17 DIAGNOSIS — F17200 Nicotine dependence, unspecified, uncomplicated: Secondary | ICD-10-CM

## 2016-01-17 DIAGNOSIS — I428 Other cardiomyopathies: Secondary | ICD-10-CM | POA: Insufficient documentation

## 2016-01-17 DIAGNOSIS — A399 Meningococcal infection, unspecified: Secondary | ICD-10-CM

## 2016-01-17 MED ORDER — CARVEDILOL 6.25 MG PO TABS: 6.2500 mg | ORAL_TABLET | Freq: Two times a day (BID) | ORAL | Status: DC

## 2016-01-17 MED ORDER — AMIODARONE HCL 200 MG PO TABS: 200.0000 mg | ORAL_TABLET | Freq: Two times a day (BID) | ORAL | Status: DC

## 2016-01-17 NOTE — Patient Instructions (Addendum)
You are doing well.  For the heart rhythm: Please start amiodarone 2 pill in the am and 2 pills in the PM for 5 days Then decrease down to 1 pill twice a day  To make the heart stronger, Start coreg 6.25 mg twice a day  We will check BMP today  Please call us if you have new issues that need to be addressed before your next appt.  Your physician wants you to follow-up in: 1 month.

## 2016-01-17 NOTE — Progress Notes (Signed)
Patient ID: Gabriel Piehl., male   DOB: 1970-11-26, 45 y.o.   MRN: [id number] Cardiology Office Note  Date:  01/17/2016   ID:  Gabriel Righter., DOB 06-Jun-1971, MRN [id number]  PCP:  No PCP Per Patient   Chief Complaint  Patient presents with  . other    Hospital Admission F/U. Patient C/O Chest pain. Medications verbally reviewed with patient.     HPI: 45 y.o. male with h/o nonischemic cardiomyopathy by nuclear stress test in [id number], chronic systolic CHF, COPD, ongoing tobacco abuse at 2 ppd since age 61, DM2, and obesity who presented to [facility] on 01/03/16 with cough and SOB, septic shock, right upper lobe pneumonia,  fevers and chills. He presents to the office after recent hospital discharge following septic shock in the setting of right upper lobe pneumonia.   Echocardiogram in the hospital showed severely depressed ejection fraction 25% CXR showed right upper lobe consolidation c/w pneumonia.  started on IV Rocephin and azithromycin.  Troponin trend of 0.22-->0.19-->0.18-->1.05-->0.13 WBC count of 28,000 on arrival Febrile to a T max of 101.2 this admission.  Hypotensive with BP down to the 70's shortly after admission,   Prior to discharge was started on hydralazine, isosorbide, carvedilol. Secondary to acute renal failure, was not started on ACE inhibitor or ARB He did have short runs of nonsustained VT seen on telemetry For some reason on presentation today he is not on his carvedilol  Reports he feels very tired, tightness in chest secondary to residual breathing issues Denies any significant cough Overall has no energy and feels weak, does not feel ready to go back to work Reports he is driving for the first time today since his hospital discharge 01/09/2016  EKG on today's visit shows normal sinus rhythm with rate 71 bpm, no significant ST or T-wave changes  He wore a 48-hour Holter monitor which was reviewed with him in detail today Monitor shows short rare runs of  atrial tachycardia, also short rare episodes of nonsustained VT. He does report rare fluttering in his chest like a vibration otherwise no significant symptoms  Other past medical history Patient was previously admitted in August 2016 and September 2016 with atypical chest pain.  Echo during August admission showed mildly reduced LV systolic function with mildly elevated troponin and BNP. He was hypertensive and unable to afford his medications at that time.   He was discharged back in August 2016 on antihypertensives, but did nto take them. During his September 2016 admission for atypical chest pain and HTN he underwent nuclear stress testing that showed a small defect of mild severity in the apex location felt to be 2/2 apical thinning. EF was estimated at 30-44%. Overall, this was an intermediate risk scan. There was no evidence of significant perfusion abnormalities and he was felt to have NICM.     PMH:   has a past medical history of Hypertension; Obesity (BMI 30.0-34.9); Psoriasis; Diabetes mellitus without complication (HCC); CHF (congestive heart failure) ([place]); and COPD (chronic obstructive pulmonary disease) ([place]).  PSH:    Past Surgical History  Procedure Laterality Date  . Amputation    . Finger amputation      Traumatic    Current Outpatient Prescriptions  Medication Sig Dispense Refill  . aspirin EC 81 MG tablet Take 1 tablet (81 mg total) by mouth daily. 30 tablet 0  . furosemide (LASIX) 40 MG tablet Take 1 tablet (40 mg total) by mouth every other day. 30 tablet 0  .  guaiFENesin-codeine 100-10 MG/5ML syrup Take 10 mLs by mouth every 4 (four) hours as needed for cough. 120 mL 0  . hydrALAZINE (APRESOLINE) 50 MG tablet Take 1 tablet (50 mg total) by mouth every 8 (eight) hours. 90 tablet 0  . isosorbide mononitrate (IMDUR) 30 MG 24 hr tablet Take 1 tablet (30 mg total) by mouth daily. 30 tablet 0  . tamsulosin (FLOMAX) 0.4 MG CAPS capsule Take 1 capsule (0.4 mg total) by  mouth daily. 30 capsule 0  . amiodarone (PACERONE) 200 MG tablet Take 1 tablet (200 mg total) by mouth 2 (two) times daily. 70 tablet 3  . carvedilol (COREG) 6.25 MG tablet Take 1 tablet (6.25 mg total) by mouth 2 (two) times daily. 60 tablet 6   No current facility-administered medications for this visit.     Allergies:   Prednisone   Social History:  The patient  reports that he quit smoking about 2 weeks ago. His smoking use included Cigarettes. He smoked 0.00 packs per day for 33 years. He has never used smokeless tobacco. He reports that he drinks about 1.8 oz of alcohol per week. He reports that he does not use illicit drugs.   Family History:   family history includes Cancer in his maternal grandfather and paternal aunt; Diabetes Mellitus II in his mother; Hypertension in his father.    Review of Systems: Review of Systems  Constitutional: Positive for malaise/fatigue.  Respiratory: Positive for shortness of breath.   Cardiovascular: Negative.   Gastrointestinal: Negative.   Musculoskeletal: Negative.   Neurological: Positive for weakness.  Psychiatric/Behavioral: Negative.   All other systems reviewed and are negative.    PHYSICAL EXAM: VS:  BP 120/76 mmHg  Pulse 71  Ht 6\' 1"  (1.854 m)  Wt 230 lb (104.327 kg)  BMI 30.35 kg/m2 , BMI Body mass index is 30.35 kg/(m^2). GEN: Well nourished, well developed, in no acute distress HEENT: normal Neck: no JVD, carotid bruits, or masses Cardiac: RRR; no murmurs, rubs, or gallops,no edema  Respiratory:  clear to auscultation bilaterally, normal work of breathing GI: soft, nontender, nondistended, + BS MS: no deformity or atrophy Skin: warm and dry, no rash Neuro:  Strength and sensation are intact Psych: euthymic mood, full affect    Recent Labs: 03/20/2015: TSH 2.367 05/17/2015: B Natriuretic Peptide 1103.0* 01/08/2016: ALT 147*; Hemoglobin 13.2; Platelets 163 01/09/2016: BUN 53*; Creatinine, Ser 2.24*; Magnesium 1.8;  Potassium 4.4; Sodium 139    Lipid Panel Lab Results  Component Value Date   CHOL 74 01/04/2016   HDL 14* 01/04/2016   LDLCALC 46 01/04/2016   TRIG 71 01/04/2016      Wt Readings from Last 3 Encounters:  01/17/16 230 lb (104.327 kg)  01/04/16 251 lb 8.7 oz (114.1 kg)  12/14/15 221 lb (100.245 kg)       ASSESSMENT AND PLAN:  Chest pain, unspecified chest pain type -  Plan: EKG [id number], Basic metabolic panel, Basic metabolic panel Tightness in the chest likely residual pneumonia, still recovering No signs of continued infection Blood cultures discussed with him growing Neisseria meningitidis   Essential hypertension Blood pressure stable, we will restart low-dose carvedilol   Hyperlipidemia Currently not on simvastatin We'll discuss with him in follow-up   Chronic systolic heart failure (HCC) Depressed ejection fraction in the setting of recent sepsis CT scan reviewed with him showing no coronary calcifications, previously felt to have nonischemic cardiomyopathy. Recheck BMP, if renal function back to his baseline, could start low-dose ACE inhibitor or  ARB   NSVT (nonsustained ventricular tachycardia) (HCC) Nonsustained VT seen in the hospital on telemetry and again on monitor Likely secondary to his cardiomyopathy. He is asymptomatic Recommended he start amiodarone 400 mg twice a day for 5 days down to 200 mg twice a day Would also restart his carvedilol Could potentially wean off amiodarone after echocardiogram confirms improved ejection fraction   Smoking We have encouraged him to continue to work on weaning his cigarettes and smoking cessation. He will continue to work on this and does not want any assistance with chantix.    Sepsis, due to unspecified organism [facility]) Recent Neisseria meningitidis, sepsis, pneumonia Treated with antibiotics  CAP (community acquired pneumonia) As above, still with residual shortness of breath, does not feel at his  baseline   Atrial tachycardia, paroxysmal (HCC) Atrial tachycardia seen on Holter monitor. We'll restart his carvedilol 6.25 mill grams twice a day with slow titration upwards    Disposition:   F/U  6 months   Orders Placed This Encounter  Procedures  . Basic metabolic panel  . EKG 12-Lead     Signed, Esmond Plants, M.D., Ph.D. 01/17/2016  [place], [place]

## 2016-01-18 LAB — BASIC METABOLIC PANEL
BUN/Creatinine Ratio: 18 (ref 9–20)
BUN: 25 mg/dL — ABNORMAL HIGH (ref 6–24)
CO2: 22 mmol/L (ref 18–29)
Calcium: 9 mg/dL (ref 8.7–10.2)
Chloride: 98 mmol/L (ref 96–106)
Creatinine, Ser: 1.37 mg/dL — ABNORMAL HIGH (ref 0.76–1.27)
GFR calc Af Amer: 71 mL/min/{1.73_m2} (ref >59)
GFR calc non Af Amer: 61 mL/min/{1.73_m2} (ref >59)
Glucose: 109 mg/dL — ABNORMAL HIGH (ref 65–99)
Potassium: 4.5 mmol/L (ref 3.5–5.2)
Sodium: 142 mmol/L (ref 134–144)

## 2016-01-19 ENCOUNTER — Telehealth: Payer: Self-pay | Admitting: *Deleted

## 2016-01-19 NOTE — Telephone Encounter (Signed)
Cancellation for monitor received from [employer] due to unable to contact or reach patient 01/19/16.

## 2016-01-22 ENCOUNTER — Observation Stay
Admission: EM | Admit: 2016-01-22 | Discharge: 2016-01-23 | Disposition: A | Discharge status: Home / Self Care | Payer: 59 | Attending: Specialist | Admitting: Specialist

## 2016-01-22 ENCOUNTER — Emergency Department: Payer: 59

## 2016-01-22 ENCOUNTER — Encounter: Payer: Self-pay | Admitting: Emergency Medicine

## 2016-01-22 DIAGNOSIS — I429 Cardiomyopathy, unspecified: Secondary | ICD-10-CM | POA: Insufficient documentation

## 2016-01-22 DIAGNOSIS — I509 Heart failure, unspecified: Secondary | ICD-10-CM | POA: Insufficient documentation

## 2016-01-22 DIAGNOSIS — I951 Orthostatic hypotension: Principal | ICD-10-CM

## 2016-01-22 DIAGNOSIS — E669 Obesity, unspecified: Secondary | ICD-10-CM | POA: Insufficient documentation

## 2016-01-22 DIAGNOSIS — Z833 Family history of diabetes mellitus: Secondary | ICD-10-CM | POA: Insufficient documentation

## 2016-01-22 DIAGNOSIS — R17 Unspecified jaundice: Secondary | ICD-10-CM | POA: Insufficient documentation

## 2016-01-22 DIAGNOSIS — R55 Syncope and collapse: Secondary | ICD-10-CM

## 2016-01-22 DIAGNOSIS — J189 Pneumonia, unspecified organism: Secondary | ICD-10-CM | POA: Insufficient documentation

## 2016-01-22 DIAGNOSIS — I110 Hypertensive heart disease with heart failure: Secondary | ICD-10-CM | POA: Insufficient documentation

## 2016-01-22 DIAGNOSIS — F17210 Nicotine dependence, cigarettes, uncomplicated: Secondary | ICD-10-CM | POA: Insufficient documentation

## 2016-01-22 DIAGNOSIS — N400 Enlarged prostate without lower urinary tract symptoms: Secondary | ICD-10-CM | POA: Insufficient documentation

## 2016-01-22 DIAGNOSIS — J440 Chronic obstructive pulmonary disease with acute lower respiratory infection: Secondary | ICD-10-CM | POA: Insufficient documentation

## 2016-01-22 DIAGNOSIS — Z8249 Family history of ischemic heart disease and other diseases of the circulatory system: Secondary | ICD-10-CM | POA: Insufficient documentation

## 2016-01-22 DIAGNOSIS — E876 Hypokalemia: Secondary | ICD-10-CM

## 2016-01-22 DIAGNOSIS — R531 Weakness: Secondary | ICD-10-CM | POA: Insufficient documentation

## 2016-01-22 DIAGNOSIS — I5022 Chronic systolic (congestive) heart failure: Secondary | ICD-10-CM

## 2016-01-22 DIAGNOSIS — R079 Chest pain, unspecified: Secondary | ICD-10-CM

## 2016-01-22 DIAGNOSIS — Z6830 Body mass index (BMI) 30.0-30.9, adult: Secondary | ICD-10-CM | POA: Insufficient documentation

## 2016-01-22 DIAGNOSIS — Z7982 Long term (current) use of aspirin: Secondary | ICD-10-CM | POA: Insufficient documentation

## 2016-01-22 DIAGNOSIS — Z888 Allergy status to other drugs, medicaments and biological substances status: Secondary | ICD-10-CM | POA: Insufficient documentation

## 2016-01-22 DIAGNOSIS — R197 Diarrhea, unspecified: Secondary | ICD-10-CM

## 2016-01-22 DIAGNOSIS — E119 Type 2 diabetes mellitus without complications: Secondary | ICD-10-CM | POA: Insufficient documentation

## 2016-01-22 DIAGNOSIS — J849 Interstitial pulmonary disease, unspecified: Secondary | ICD-10-CM | POA: Insufficient documentation

## 2016-01-22 DIAGNOSIS — R188 Other ascites: Secondary | ICD-10-CM | POA: Insufficient documentation

## 2016-01-22 DIAGNOSIS — Z79899 Other long term (current) drug therapy: Secondary | ICD-10-CM | POA: Insufficient documentation

## 2016-01-22 DIAGNOSIS — Z89029 Acquired absence of unspecified finger(s): Secondary | ICD-10-CM | POA: Insufficient documentation

## 2016-01-22 LAB — COMPREHENSIVE METABOLIC PANEL
ALT: 28 U/L (ref 17–63)
AST: 25 U/L (ref 15–41)
Albumin: 3.4 g/dL — ABNORMAL LOW (ref 3.5–5.0)
Alkaline Phosphatase: 78 U/L (ref 38–126)
Anion gap: 9 (ref 5–15)
BUN: 21 mg/dL — ABNORMAL HIGH (ref 6–20)
CO2: 26 mmol/L (ref 22–32)
Calcium: 8.5 mg/dL — ABNORMAL LOW (ref 8.9–10.3)
Chloride: 104 mmol/L (ref 101–111)
Creatinine, Ser: 1.27 mg/dL — ABNORMAL HIGH (ref 0.61–1.24)
GFR calc Af Amer: >60 mL/min (ref >60)
GFR calc non Af Amer: >60 mL/min (ref >60)
Glucose, Bld: 99 mg/dL (ref 65–99)
Potassium: 3.2 mmol/L — ABNORMAL LOW (ref 3.5–5.1)
Sodium: 139 mmol/L (ref 135–145)
Total Bilirubin: 0.7 mg/dL (ref 0.3–1.2)
Total Protein: 7.3 g/dL (ref 6.5–8.1)

## 2016-01-22 LAB — CBC
HCT: 46.4 % (ref 40.0–52.0)
Hemoglobin: 15.4 g/dL (ref 13.0–18.0)
MCH: 27.2 pg (ref 26.0–34.0)
MCHC: 33.1 g/dL (ref 32.0–36.0)
MCV: 82.2 fL (ref 80.0–100.0)
Platelets: 234 10*3/uL (ref 150–440)
RBC: 5.65 MIL/uL (ref 4.40–5.90)
RDW: 15.5 % — ABNORMAL HIGH (ref 11.5–14.5)
WBC: 10 10*3/uL (ref 3.8–10.6)

## 2016-01-22 LAB — TROPONIN I
Troponin I: 0.04 ng/mL — ABNORMAL HIGH (ref <0.031)
Troponin I: 0.03 ng/mL (ref <0.031)

## 2016-01-22 MED ORDER — HEPARIN SODIUM (PORCINE) 5000 UNIT/ML IJ SOLN
5000.0000 [IU] | Freq: Three times a day (TID) | INTRAMUSCULAR | Status: DC
  Administered 2016-01-23 (×2): 5000 [IU] via SUBCUTANEOUS
  Filled 2016-01-22 (×2): qty 1

## 2016-01-22 MED ORDER — SODIUM CHLORIDE 0.9% FLUSH
3.0000 mL | INTRAVENOUS | Status: DC | PRN
Start: 2016-01-22 — End: 2016-01-23

## 2016-01-22 MED ORDER — ISOSORBIDE MONONITRATE ER 30 MG PO TB24
30.0000 mg | ORAL_TABLET | Freq: Every day | ORAL | Status: DC
  Administered 2016-01-23: 30 mg via ORAL
  Filled 2016-01-22: qty 1

## 2016-01-22 MED ORDER — SODIUM CHLORIDE 0.9 % IV SOLN
Freq: Once | INTRAVENOUS | Status: AC
  Administered 2016-01-22: 18:00:00 via INTRAVENOUS

## 2016-01-22 MED ORDER — GUAIFENESIN-CODEINE 100-10 MG/5ML PO SOLN: 10.0000 mL | ORAL | Status: DC | PRN

## 2016-01-22 MED ORDER — ONDANSETRON HCL 4 MG/2ML IJ SOLN: 4.0000 mg | Freq: Four times a day (QID) | INTRAMUSCULAR | Status: DC | PRN

## 2016-01-22 MED ORDER — SODIUM CHLORIDE 0.9% FLUSH
3.0000 mL | Freq: Two times a day (BID) | INTRAVENOUS | Status: DC
  Administered 2016-01-23 (×2): 3 mL via INTRAVENOUS

## 2016-01-22 MED ORDER — HYDROMORPHONE HCL 1 MG/ML IJ SOLN
1.0000 mg | Freq: Once | INTRAMUSCULAR | Status: AC
  Administered 2016-01-22: 1 mg via INTRAVENOUS
  Filled 2016-01-22: qty 1

## 2016-01-22 MED ORDER — MORPHINE SULFATE (PF) 4 MG/ML IV SOLN
4.0000 mg | INTRAVENOUS | Status: DC | PRN
  Administered 2016-01-23: 4 mg via INTRAVENOUS
  Filled 2016-01-22: qty 1

## 2016-01-22 MED ORDER — HYDRALAZINE HCL 20 MG/ML IJ SOLN
10.0000 mg | Freq: Once | INTRAMUSCULAR | Status: AC
  Administered 2016-01-22: 10 mg via INTRAVENOUS

## 2016-01-22 MED ORDER — INSULIN ASPART 100 UNIT/ML ~~LOC~~ SOLN: 0.0000 [IU] | Freq: Three times a day (TID) | SUBCUTANEOUS | Status: DC

## 2016-01-22 MED ORDER — AMIODARONE HCL 200 MG PO TABS
200.0000 mg | ORAL_TABLET | Freq: Two times a day (BID) | ORAL | Status: DC
  Administered 2016-01-23 (×2): 200 mg via ORAL
  Filled 2016-01-22 (×2): qty 1

## 2016-01-22 MED ORDER — ALBUTEROL SULFATE (2.5 MG/3ML) 0.083% IN NEBU: 2.5000 mg | INHALATION_SOLUTION | RESPIRATORY_TRACT | Status: DC | PRN

## 2016-01-22 MED ORDER — ASPIRIN 81 MG PO CHEW
81.0000 mg | CHEWABLE_TABLET | Freq: Every day | ORAL | Status: DC
  Administered 2016-01-23: 81 mg via ORAL
  Filled 2016-01-22: qty 1

## 2016-01-22 MED ORDER — TAMSULOSIN HCL 0.4 MG PO CAPS: 0.4000 mg | ORAL_CAPSULE | Freq: Every day | ORAL | Status: DC

## 2016-01-22 MED ORDER — HYDRALAZINE HCL 20 MG/ML IJ SOLN
10.0000 mg | Freq: Four times a day (QID) | INTRAMUSCULAR | Status: DC | PRN
  Administered 2016-01-23: 10 mg via INTRAVENOUS
  Filled 2016-01-22: qty 1

## 2016-01-22 MED ORDER — ACETAMINOPHEN 650 MG RE SUPP
650.0000 mg | Freq: Four times a day (QID) | RECTAL | Status: DC | PRN
Start: 2016-01-22 — End: 2016-01-23

## 2016-01-22 MED ORDER — ACETAMINOPHEN 325 MG PO TABS: 650.0000 mg | ORAL_TABLET | Freq: Four times a day (QID) | ORAL | Status: DC | PRN

## 2016-01-22 MED ORDER — FUROSEMIDE 40 MG PO TABS
40.0000 mg | ORAL_TABLET | ORAL | Status: DC
  Administered 2016-01-23: 40 mg via ORAL
  Filled 2016-01-22: qty 1

## 2016-01-22 MED ORDER — ONDANSETRON HCL 4 MG PO TABS: 4.0000 mg | ORAL_TABLET | Freq: Four times a day (QID) | ORAL | Status: DC | PRN

## 2016-01-22 MED ORDER — INSULIN ASPART 100 UNIT/ML ~~LOC~~ SOLN: 0.0000 [IU] | Freq: Every day | SUBCUTANEOUS | Status: DC

## 2016-01-22 MED ORDER — POTASSIUM CHLORIDE CRYS ER 20 MEQ PO TBCR
40.0000 meq | EXTENDED_RELEASE_TABLET | Freq: Once | ORAL | Status: AC
Start: 2016-01-23 — End: 2016-01-23
  Administered 2016-01-23: 40 meq via ORAL
  Filled 2016-01-22: qty 2

## 2016-01-22 MED ORDER — HYDROCODONE-ACETAMINOPHEN 5-325 MG PO TABS: 1.0000 | ORAL_TABLET | ORAL | Status: DC | PRN

## 2016-01-22 MED ORDER — CARVEDILOL 6.25 MG PO TABS
6.2500 mg | ORAL_TABLET | Freq: Two times a day (BID) | ORAL | Status: DC
  Administered 2016-01-23: 6.25 mg via ORAL
  Filled 2016-01-22: qty 1

## 2016-01-22 MED ORDER — SODIUM CHLORIDE 0.9 % IV SOLN: 250.0000 mL | INTRAVENOUS | Status: DC | PRN

## 2016-01-22 MED ORDER — HYDRALAZINE HCL 50 MG PO TABS
50.0000 mg | ORAL_TABLET | Freq: Three times a day (TID) | ORAL | Status: DC
  Administered 2016-01-23 (×2): 50 mg via ORAL
  Filled 2016-01-22 (×2): qty 1

## 2016-01-22 NOTE — ED Notes (Signed)
Chest pain began earlier today, general  Weakness x 2 days.

## 2016-01-22 NOTE — ED Provider Notes (Signed)
[facility] Emergency Department Provider Note        Time seen: ----------------------------------------- 4:23 PM on 01/22/2016 -----------------------------------------    I have reviewed the triage vital signs and the nursing notes.   HISTORY  Chief Complaint Chest Pain    HPI Gabriel [name]. is a 45 y.o. male who presents to ER for chest pain.Patient states chest pain began earlier today, chest pain is centralized. Nothing makes it better or worse. Patient was recent started on 2 blood pressure medications which she thinks might be making her feel bad. Patient is describing progressive weakness and feeling like he doesn't have any energy. Patient was recently seen for chest pain symptoms.   Past Medical History  Diagnosis Date  . Hypertension     Resolved since weight loss  . Obesity (BMI 30.0-34.9)   . Psoriasis   . Diabetes mellitus without complication ([place])     Pre diabetic  . CHF (congestive heart failure) ([place])   . COPD (chronic obstructive pulmonary disease) [facility])     Patient Active Problem List   Diagnosis Date Noted  . Congestive dilated cardiomyopathy ([place]) 01/17/2016  . NSVT (nonsustained ventricular tachycardia) ([place])   . 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 CAP (community acquired pneumonia)   . Arterial hypotension   . Elevated troponin   . Chronic systolic heart failure ([place])   . Sepsis ([place]) 01/03/2016  . Chronic diastolic heart failure ([place]) 05/29/2015  . Type II diabetes mellitus ([place]) 05/17/2015  . Hyperlipidemia 05/17/2015  . COPD (chronic obstructive pulmonary disease) ([place]) 05/17/2015  . HTN (hypertension) 03/31/2015  . Smoking 03/31/2015  . CHF (congestive heart failure) ([place]) 03/20/2015    Past Surgical History  Procedure Laterality Date  . Amputation    . Finger amputation      Traumatic    Allergies Prednisone  Social History Social History  Substance Use Topics  . Smoking status:  Current Some Day Smoker -- 0.00 packs/day for 33 years    Types: Cigarettes    Last Attempt to Quit: 01/01/2016  . Smokeless tobacco: Never Used  . Alcohol Use: 1.8 oz/week    3 Cans of beer, 0 Standard drinks or equivalent per week     Comment: occassionally    Review of Systems Constitutional: Negative for fever. Eyes: Negative for visual changes. ENT: Negative for sore throat. Cardiovascular: Positive for chest pain Respiratory: Negative for shortness of breath. Gastrointestinal: Negative for abdominal pain, vomiting and diarrhea. Genitourinary: Negative for dysuria. Musculoskeletal: Negative for back pain. Skin: Negative for rash. Neurological: Negative for headaches, positive for weakness  10-point ROS otherwise negative.  ____________________________________________   PHYSICAL EXAM:  VITAL SIGNS: ED Triage Vitals  Enc Vitals Group     BP 01/22/16 1616 165/100 mmHg     Pulse Rate 01/22/16 1616 66     Resp 01/22/16 1616 18     Temp 01/22/16 1616 98.4 F (36.9 C)     Temp Source 01/22/16 1616 Oral     SpO2 01/22/16 1616 92 %     Weight 01/22/16 1616 230 lb (104.327 kg)     Height 01/22/16 1616 6\' 1"  (1.854 m)     Head Cir --      Peak Flow --      Pain Score 01/22/16 1613 8     Pain Loc --      Pain Edu? --      Excl. in [place]? --     Constitutional:  Alert and oriented. Mild distress Eyes: Conjunctivae are normal. PERRL. Normal extraocular movements. ENT   Head: Normocephalic and atraumatic.   Nose: No congestion/rhinnorhea.   Mouth/Throat: Mucous membranes are moist.   Neck: No stridor. Cardiovascular: Normal rate, regular rhythm. No murmurs, rubs, or gallops. Respiratory: Normal respiratory effort without tachypnea nor retractions. Breath sounds are clear and equal bilaterally. No wheezes/rales/rhonchi. Gastrointestinal: Soft and nontender. Normal bowel sounds Musculoskeletal: Nontender with normal range of motion in all extremities. No lower  extremity tenderness nor edema. Neurologic:  Normal speech and language. No gross focal neurologic deficits are appreciated.  Skin:  Skin is warm, dry and intact. No rash noted. Psychiatric: Mood and affect are normal. Speech and behavior are normal.  ____________________________________________  EKG: Interpreted by me. Sinus rhythm with a rate of 66 bpm, normal axis, normal intervals. No evidence of hypertrophy or acute infarction.  ____________________________________________  ED COURSE:  Pertinent labs & imaging results that were available during my care of the patient were reviewed by me and considered in my medical decision making (see chart for details). Patient presented for chest pain and weakness, we'll check cardiac labs, give gentle saline and discuss with cardiology. ____________________________________________    LABS (pertinent positives/negatives)  Labs Reviewed  CBC - Abnormal; Notable for the following:    RDW 15.5 (*)    All other components within normal limits  COMPREHENSIVE METABOLIC PANEL - Abnormal; Notable for the following:    Potassium 3.2 (*)    BUN 21 (*)    Creatinine, Ser 1.27 (*)    Calcium 8.5 (*)    Albumin 3.4 (*)    All other components within normal limits  TROPONIN I - Abnormal; Notable for the following:    Troponin I 0.04 (*)    All other components within normal limits    RADIOLOGY Images were viewed by me  Chest x-ray IMPRESSION: Persistent but significantly improved right upper lobe infiltrate. No new focal abnormality is seen. Continued follow-up is recommended. ____________________________________________  FINAL ASSESSMENT AND PLAN  Chest pain, weakness, Syncope  Plan: Patient with labs and imaging as dictated above. Patient presents with chest pain, syncope today and persistent weakness. Patient was recently seen by cardiology who started him on carvedilol and amiodarone. It is unclear whether this caused his syncope today  or not. I will discuss with the hospitalist for observation.   Earleen Newport, MD   Note: This dictation was prepared with Dragon dictation. Any transcriptional errors that result from this process are unintentional   Earleen Newport, MD 01/22/16 [phone]

## 2016-01-22 NOTE — H&P (Addendum)
[facility] [place] NAME: Gabriel [name]    MR#:  [id number]  DATE OF BIRTH:  Jul 20, 1971  DATE OF ADMISSION:  01/22/2016  PRIMARY CARE PHYSICIAN: No PCP Per Patient   REQUESTING/REFERRING PHYSICIAN: Earleen Newport, MD  CHIEF COMPLAINT:   Chief Complaint  Patient presents with  . Chest Pain   Syncope and chest pain today. HISTORY OF PRESENT ILLNESS:  Gabriel [name]  is a 45 y.o. male with a known history of Hypertension, diabetes, COPD and CHF. The patient presented to the ED with syncope episode today. He said he passed out at home at about 3:00 this afternoon. He has been feeling nausea and weak after taking new medication (Coreg and amiodarone) the cardiologist gave to him and has had diarrhea for several days since last admission. He also complains of chest pain on the lower part of the chest on both sides, which he feels cramps. He was treated for neisseria meningitidis bacteremia due to right upper lobe pneumonia with severe sepsis in the hospital 2 weeks ago.  PAST MEDICAL HISTORY:   Past Medical History  Diagnosis Date  . Hypertension     Resolved since weight loss  . Obesity (BMI 30.0-34.9)   . Psoriasis   . Diabetes mellitus without complication ([place])     Pre diabetic  . CHF (congestive heart failure) ([place])   . COPD (chronic obstructive pulmonary disease) ([place])     PAST SURGICAL HISTORY:   Past Surgical History  Procedure Laterality Date  . Amputation    . Finger amputation      Traumatic    SOCIAL HISTORY:   Social History  Substance Use Topics  . Smoking status: Current Some Day Smoker -- 0.00 packs/day for 33 years    Types: Cigarettes    Last Attempt to Quit: 01/01/2016  . Smokeless tobacco: Never Used  . Alcohol Use: 1.8 oz/week    3 Cans of beer, 0 Standard drinks or equivalent per week     Comment: occassionally    FAMILY HISTORY:   Family History  Problem Relation Age of Onset  .  Diabetes Mellitus II Mother   . Hypertension Father   . Cancer Paternal Aunt   . Cancer Maternal Grandfather     DRUG ALLERGIES:   Allergies  Allergen Reactions  . Prednisone Other (See Comments)    Reaction: Hallucinations     REVIEW OF SYSTEMS:  CONSTITUTIONAL: No fever, Has generalized weakness.  EYES: No blurred or double vision.  EARS, NOSE, AND THROAT: No tinnitus or ear pain.  RESPIRATORY: No cough, shortness of breath, wheezing or hemoptysis.  CARDIOVASCULAR: Has chest pain, no orthopnea, edema.  GASTROINTESTINAL: Has nausea, diarrhea, but no abdominal pain or vomiting.  GENITOURINARY: No dysuria, hematuria.  ENDOCRINE: No polyuria, nocturia,  HEMATOLOGY: No anemia, easy bruising or bleeding SKIN: No rash or lesion. MUSCULOSKELETAL: No joint pain or arthritis.   NEUROLOGIC: No tingling, numbness, weakness. Has Syncope. But no seizure. PSYCHIATRY: No anxiety or depression.   MEDICATIONS AT HOME:   Prior to Admission medications   Medication Sig Start Date End Date Taking? Authorizing Provider  amiodarone (PACERONE) 200 MG tablet Take 1 tablet (200 mg total) by mouth 2 (two) times daily. 01/17/16  Yes Minna Merritts, MD  aspirin 81 MG chewable tablet Chew 81 mg by mouth daily.   Yes Historical Provider, MD  carvedilol (COREG) 6.25 MG tablet Take 1 tablet (6.25 mg total) by mouth 2 (  two) times daily. 01/17/16  Yes Minna Merritts, MD  furosemide (LASIX) 40 MG tablet Take 1 tablet (40 mg total) by mouth every other day. 01/09/16 01/08/17 Yes Demetrios Loll, MD  guaiFENesin-codeine 100-10 MG/5ML syrup Take 10 mLs by mouth every 4 (four) hours as needed for cough. 01/09/16  Yes Demetrios Loll, MD  hydrALAZINE (APRESOLINE) 50 MG tablet Take 1 tablet (50 mg total) by mouth every 8 (eight) hours. 01/09/16  Yes Demetrios Loll, MD  isosorbide mononitrate (IMDUR) 30 MG 24 hr tablet Take 1 tablet (30 mg total) by mouth daily. 01/09/16  Yes Demetrios Loll, MD  tamsulosin (FLOMAX) 0.4 MG CAPS capsule Take  0.4 mg by mouth daily after supper.   Yes Historical Provider, MD      VITAL SIGNS:  Blood pressure 171/113, pulse 66, temperature 98.4 F (36.9 C), temperature source Oral, resp. rate 15, height 6\' 1"  (1.854 m), weight 230 lb (104.327 kg), SpO2 96 %.  PHYSICAL EXAMINATION:  GENERAL:  45 y.o.-year-old patient lying in the bed with no acute distress. Obese. EYES: Pupils equal, round, reactive to light and accommodation. No scleral icterus. Extraocular muscles intact.  HEENT: Head atraumatic, normocephalic. Oropharynx and nasopharynx clear. Moist oral mucosa. NECK:  Supple, no jugular venous distention. No thyroid enlargement, no tenderness.  LUNGS: Normal breath sounds bilaterally, no wheezing, rales,rhonchi or crepitation. No use of accessory muscles of respiration.  CARDIOVASCULAR: S1, S2 normal. No murmurs, rubs, or gallops.  ABDOMEN: Soft, nontender, nondistended. Bowel sounds present. No organomegaly or mass.  EXTREMITIES: No pedal edema, cyanosis, or clubbing.  NEUROLOGIC: Cranial nerves II through XII are intact. Muscle strength 5/5 in all extremities. Sensation intact. Gait not checked.  PSYCHIATRIC: The patient is alert and oriented x 3.  SKIN: No obvious rash, lesion, or ulcer.   LABORATORY PANEL:   CBC  Recent Labs Lab 01/22/16 1643  WBC 10.0  HGB 15.4  HCT 46.4  PLT 234   ------------------------------------------------------------------------------------------------------------------  Chemistries   Recent Labs Lab 01/22/16 1828  NA 139  K 3.2*  CL 104  CO2 26  GLUCOSE 99  BUN 21*  CREATININE 1.27*  CALCIUM 8.5*  AST 25  ALT 28  ALKPHOS 78  BILITOT 0.7   ------------------------------------------------------------------------------------------------------------------  Cardiac Enzymes  Recent Labs Lab 01/22/16 1828  TROPONINI 0.04*    ------------------------------------------------------------------------------------------------------------------  RADIOLOGY:  Dg Chest Port 1 View  01/22/2016  CLINICAL DATA:  Chest pain EXAM: PORTABLE CHEST 1 VIEW COMPARISON:  01/04/16 FINDINGS: Cardiac shadow is stable. The right jugular line is been removed in the interval. Some mild residual infiltrate is noted in the right upper lobe when compared with the prior exam but significantly improved. No new focal infiltrate is seen. Continued follow-up is recommended. No bony abnormality is noted. IMPRESSION: Persistent but significantly improved right upper lobe infiltrate. No new focal abnormality is seen. Continued follow-up is recommended. Electronically Signed   By: Inez Catalina M.D.   On: 01/22/2016 17:17    EKG:   Orders placed or performed during the hospital encounter of 01/22/16  . EKG 12-Lead  . EKG 12-Lead  . ED EKG  . ED EKG    IMPRESSION AND PLAN:   Syncope The patient will be placed for observation. Continued telemetry monitor, follow-up carotid duplex and Cardiology consult.  Mild elevated troponin. Follow-up troponin level and continue aspirin.  Accelerated hypertension. Continue home hypertension medication and give hydralazine IV when necessary.  Diarrhea. Check stool test.  Chronic systolic CHF with ejection fraction 25 percent.  Continue Lasix, Coreg and amiodarone.  Diabetes. Start sliding scale.   All the records are reviewed and case discussed with ED provider. Management plans discussed with the patient, family and they are in agreement.  CODE STATUS: Full code  TOTAL TIME TAKING CARE OF THIS PATIENT: 46 minutes.    Demetrios Loll M.D on 01/22/2016 at 8:39 PM  Between 7am to 6pm - Pager - [phone]  After 6pm go to [email or web address] - password [email or web address] [facility]  [facility]  Office  [phone]  CC: Primary care physician; No PCP Per Patient

## 2016-01-23 ENCOUNTER — Observation Stay: Payer: 59

## 2016-01-23 DIAGNOSIS — I951 Orthostatic hypotension: Secondary | ICD-10-CM

## 2016-01-23 DIAGNOSIS — R197 Diarrhea, unspecified: Secondary | ICD-10-CM

## 2016-01-23 DIAGNOSIS — E876 Hypokalemia: Secondary | ICD-10-CM

## 2016-01-23 DIAGNOSIS — I5022 Chronic systolic (congestive) heart failure: Secondary | ICD-10-CM

## 2016-01-23 LAB — BASIC METABOLIC PANEL
Anion gap: 7 (ref 5–15)
BUN: 25 mg/dL — ABNORMAL HIGH (ref 6–20)
CO2: 27 mmol/L (ref 22–32)
Calcium: 8.9 mg/dL (ref 8.9–10.3)
Chloride: 106 mmol/L (ref 101–111)
Creatinine, Ser: 1.32 mg/dL — ABNORMAL HIGH (ref 0.61–1.24)
GFR calc Af Amer: >60 mL/min (ref >60)
GFR calc non Af Amer: >60 mL/min (ref >60)
Glucose, Bld: 110 mg/dL — ABNORMAL HIGH (ref 65–99)
Potassium: 3.7 mmol/L (ref 3.5–5.1)
Sodium: 140 mmol/L (ref 135–145)

## 2016-01-23 LAB — MAGNESIUM: Magnesium: 1.5 mg/dL — ABNORMAL LOW (ref 1.7–2.4)

## 2016-01-23 MED ORDER — MAGNESIUM OXIDE 400 (241.3 MG) MG PO TABS: 400.0000 mg | ORAL_TABLET | Freq: Two times a day (BID) | ORAL | Status: DC

## 2016-01-23 NOTE — Discharge Summary (Signed)
[facility] NAME: Desmund Kuper    MR#:  [id number]  DATE OF BIRTH:  [birth year]  DATE OF ADMISSION:  01/22/2016 ADMITTING PHYSICIAN: Demetrios Loll, MD  DATE OF DISCHARGE: 01/23/2016  1:41 PM  PRIMARY CARE PHYSICIAN: No PCP Per Patient    ADMISSION DIAGNOSIS:  Syncope and collapse [R55] Syncope [R55] Weakness [R53.1] Chest pain, unspecified chest pain type [R07.9]  DISCHARGE DIAGNOSIS:  Principal Problem:   Orthostatic hypotension Active Problems:   Syncope   Diarrhea   Hypokalemia   Hypomagnesemia   Chronic systolic CHF (congestive heart failure) ([place])   SECONDARY DIAGNOSIS:   Past Medical History  Diagnosis Date  . Hypertension     Resolved since weight loss  . Obesity (BMI 30.0-34.9)   . Psoriasis   . Diabetes mellitus without complication ([place])     Pre diabetic  . CHF (congestive heart failure) ([place])   . COPD (chronic obstructive pulmonary disease) [facility])     HOSPITAL COURSE:   45 year old male with past medical history of essential hypertension, diabetes type 2 without complication, CHF, severe cardiomyopathy ejection fraction of 25-30%, COPD, recent admission due to Neisseria meningitidis bacteremia who presents to the hospital due to chest pain and weakness.  1. Chest pain-this was very atypical in nature. Patient was observed on telemetry cardiac markers checked which were negative. He is currently chest pain-free and hemodynamically stable before being discharged home.  2. syncope-etiology unclear but likely vasovagal in nature. He was not noted to be orthostatic, his carotid duplex were negative. He had a recent echocardiogram which showed severe LV dysfunction. He had noted subacute arrhythmias on telemetry. He's had no further episodes of syncope while in the hospital therefore he is being discharged home.  3. Generalized weakness-this was secondary to his severe cardiomyopathy. No evidence of acute infectious  source.  4. BPH-we'll continue his Flomax.  5. History of CHF-clinically in the hospital he was not in congestive heart failure. -We'll continue his Lasix Coreg hydralazine and Imdur. He'll follow-up at CHF clinic.  DISCHARGE CONDITIONS:   Stable  CONSULTS OBTAINED:  Treatment Team:  Wende Bushy, MD Wellington Hampshire, MD  DRUG ALLERGIES:   Allergies  Allergen Reactions  . Prednisone Other (See Comments)    Reaction: Hallucinations     DISCHARGE MEDICATIONS:   Discharge Medication List as of 01/23/2016 11:48 AM    CONTINUE these medications which have NOT CHANGED   Details  amiodarone (PACERONE) 200 MG tablet Take 1 tablet (200 mg total) by mouth 2 (two) times daily., Starting 01/17/2016, Until Discontinued, Normal    aspirin 81 MG chewable tablet Chew 81 mg by mouth daily., Until Discontinued, Historical Med    carvedilol (COREG) 6.25 MG tablet Take 1 tablet (6.25 mg total) by mouth 2 (two) times daily., Starting 01/17/2016, Until Discontinued, Normal    furosemide (LASIX) 40 MG tablet Take 1 tablet (40 mg total) by mouth every other day., Starting 01/09/2016, Until Wed 01/08/17, Print    guaiFENesin-codeine 100-10 MG/5ML syrup Take 10 mLs by mouth every 4 (four) hours as needed for cough., Starting 01/09/2016, Until Discontinued, Print    hydrALAZINE (APRESOLINE) 50 MG tablet Take 1 tablet (50 mg total) by mouth every 8 (eight) hours., Starting 01/09/2016, Until Discontinued, Print    isosorbide mononitrate (IMDUR) 30 MG 24 hr tablet Take 1 tablet (30 mg total) by mouth daily., Starting 01/09/2016, Until Discontinued, Print    tamsulosin (FLOMAX) 0.4 MG CAPS capsule Take  0.4 mg by mouth daily after supper., Until Discontinued, Historical Med         DISCHARGE INSTRUCTIONS:   DIET:  Cardiac diet  DISCHARGE CONDITION:  Stable  ACTIVITY:  Activity as tolerated  OXYGEN:  Home Oxygen: No.   Oxygen Delivery: room air  DISCHARGE LOCATION:  home   If you  experience worsening of your admission symptoms, develop shortness of breath, life threatening emergency, suicidal or homicidal thoughts you must seek medical attention immediately by calling 911 or calling your MD immediately  if symptoms less severe.  You Must read complete instructions/literature along with all the possible adverse reactions/side effects for all the Medicines you take and that have been prescribed to you. Take any new Medicines after you have completely understood and accpet all the possible adverse reactions/side effects.   Please note  You were cared for by a hospitalist during your hospital stay. If you have any questions about your discharge medications or the care you received while you were in the hospital after you are discharged, you can call the unit and asked to speak with the hospitalist on call if the hospitalist that took care of you is not available. Once you are discharged, your primary care physician will handle any further medical issues. Please note that NO REFILLS for any discharge medications will be authorized once you are discharged, as it is imperative that you return to your primary care physician (or establish a relationship with a primary care physician if you do not have one) for your aftercare needs so that they can reassess your need for medications and monitor your lab values.     Today   No further episodes of syncope. Still complaining of some weakness. No chest pain, shortness of breath. No arrhythmias on telemetry.  VITAL SIGNS:  Blood pressure 152/96, pulse 66, temperature 98 F (36.7 C), temperature source Oral, resp. rate 18, height 6\' 1"  (1.854 m), weight 103.329 kg (227 lb 12.8 oz), SpO2 99 %.  I/O:   Intake/Output Summary (Last 24 hours) at 01/23/16 1556 Last data filed at 01/23/16 1017  Gross per 24 hour  Intake    240 ml  Output    500 ml  Net   -260 ml    PHYSICAL EXAMINATION:  GENERAL:  45 y.o.-year-old patient lying in the  bed with no acute distress.  EYES: Pupils equal, round, reactive to light and accommodation. No scleral icterus. Extraocular muscles intact.  HEENT: Head atraumatic, normocephalic. Oropharynx and nasopharynx clear.  NECK:  Supple, no jugular venous distention. No thyroid enlargement, no tenderness.  LUNGS: Normal breath sounds bilaterally, no wheezing, rales,rhonchi. No use of accessory muscles of respiration.  CARDIOVASCULAR: S1, S2 normal. No murmurs, rubs, or gallops.  ABDOMEN: Soft, non-tender, non-distended. Bowel sounds present. No organomegaly or mass.  EXTREMITIES: No pedal edema, cyanosis, or clubbing.  NEUROLOGIC: Cranial nerves II through XII are intact. No focal motor or sensory defecits b/l.  PSYCHIATRIC: The patient is alert and oriented x 3. Good affect.  SKIN: No obvious rash, lesion, or ulcer.   DATA REVIEW:   CBC  Recent Labs Lab 01/22/16 1643  WBC 10.0  HGB 15.4  HCT 46.4  PLT 234    Chemistries   Recent Labs Lab 01/22/16 1828 01/22/16 2228 01/23/16 0411  NA 139  --  140  K 3.2*  --  3.7  CL 104  --  106  CO2 26  --  27  GLUCOSE 99  --  110*  BUN 21*  --  25*  CREATININE 1.27*  --  1.32*  CALCIUM 8.5*  --  8.9  MG  --  1.5*  --   AST 25  --   --   ALT 28  --   --   ALKPHOS 78  --   --   BILITOT 0.7  --   --     Cardiac Enzymes  Recent Labs Lab 01/22/16 2228  TROPONINI 0.03     RADIOLOGY:  US Carotid Bilateral  01/23/2016  CLINICAL DATA:  Syncope EXAM: BILATERAL CAROTID DUPLEX ULTRASOUND TECHNIQUE: Pearline Cables scale imaging, color Doppler and duplex ultrasound was performed of bilateral carotid and vertebral arteries in the neck. COMPARISON:  None. TECHNIQUE: Quantification of carotid stenosis is based on velocity parameters that correlate the residual internal carotid diameter with NASCET-based stenosis levels, using the diameter of the distal internal carotid lumen as the denominator for stenosis measurement. The following velocity measurements  were obtained: PEAK SYSTOLIC/END DIASTOLIC RIGHT ICA:                     59/20cm/sec CCA:                     [id number] SYSTOLIC ICA/CCA RATIO:  0.7 DIASTOLIC ICA/CCA RATIO: 1.2 ECA:                     129cm/sec LEFT ICA:                     52/23cm/sec CCA:                     [id number] SYSTOLIC ICA/CCA RATIO:  0.5 DIASTOLIC ICA/CCA RATIO: 1.1 ECA:                     117cm/sec FINDINGS: RIGHT CAROTID ARTERY: No significant plaque accumulation. No significant stenosis. Normal waveforms and color Doppler signal. RIGHT VERTEBRAL ARTERY:  Normal flow direction and waveform. LEFT CAROTID ARTERY: No significant plaque accumulation or stenosis. Normal waveforms and color Doppler signal. LEFT VERTEBRAL ARTERY: Normal flow direction and waveform. IMPRESSION: 1. No significant carotid plaque or stenosis. 2.  Antegrade bilateral vertebral arterial flow. Electronically Signed   By: Lucrezia Europe M.D.   On: 01/23/2016 11:04   Dg Chest Port 1 View  01/22/2016  CLINICAL DATA:  Chest pain EXAM: PORTABLE CHEST 1 VIEW COMPARISON:  01/04/16 FINDINGS: Cardiac shadow is stable. The right jugular line is been removed in the interval. Some mild residual infiltrate is noted in the right upper lobe when compared with the prior exam but significantly improved. No new focal infiltrate is seen. Continued follow-up is recommended. No bony abnormality is noted. IMPRESSION: Persistent but significantly improved right upper lobe infiltrate. No new focal abnormality is seen. Continued follow-up is recommended. Electronically Signed   By: Inez Catalina M.D.   On: 01/22/2016 17:17      Management plans discussed with the patient, family and they are in agreement.  CODE STATUS:     Code Status Orders        Start     Ordered   01/22/16 2351  Full code   Continuous     01/22/16 2350    Code Status History    Date Active Date Inactive Code Status Order ID Comments User Context   01/03/2016  2:22 PM 01/09/2016  2:54 PM Full Code  [id number]  Lytle Butte, MD  ED   05/17/2015 11:41 PM 05/19/2015  5:31 PM Full Code [id number]  Lance Coon, MD Inpatient   03/20/2015  8:26 AM 03/21/2015  3:27 PM Full Code [id number]  Harrie Foreman, MD Inpatient      TOTAL TIME TAKING CARE OF THIS PATIENT: 40 minutes.    Henreitta Leber M.D on 01/23/2016 at 3:56 PM  Between 7am to 6pm - Pager - [phone]  After 6pm go to [email or web address] - password [email or web address] [facility]  [place] [facility]  Office  [phone]  CC: Primary care physician; No PCP Per Patient

## 2016-01-23 NOTE — Discharge Instructions (Signed)
Heart Failure Clinic appointment on February 13, 2016 at 9:30am with [name], [place]. Please call [phone] to reschedule.

## 2016-01-23 NOTE — Progress Notes (Signed)
Removed telemetry and removed IV.  No new medications.  No questions at this time.

## 2016-01-23 NOTE — Consult Note (Signed)
Cardiology Consultation Note  Patient ID: Gabriel [name]., MRN: [id number], DOB/AGE: [birth year] 45 y.o. Admit date: 01/22/2016   Date of Consult: 01/23/2016 Primary Physician: No PCP Per Patient Primary Cardiologist: [name], MD  Requesting Physician: [name], MD  Chief Complaint: Syncope Reason for Consult: Orthostatic hypotension   HPI: 45 y.o. male with h/o presumed nonischemic cardiomyopathy by nuclear stress test in [id number], chronic systolic CHF, COPD, ongoing tobacco abuse at 2 ppd since age 38, DM2, and obesity who was recently admitted to [facility] in mid May with Neisseria meningitis PNA and bacteremia returned to [facility] on 6/5 with syncope.   Patient was previously admitted in August 2016 and September 2016 with atypical chest pain. Echo during August admission showed mildly reduced LV systolic function with mildly elevated troponin and BNP. He had been hypertensive and unable to afford his medications at that time. He was discharged back in August 2016 on antihypertensives, but did nto take them. During his September 2016 admission for atypical chest pain and HTN he underwent nuclear stress testing that showed a small defect of mild severity in the apex location felt to be 2/2 apical thinning. EF was estimated at 30-44%. Overall, this was an intermediate risk scan. There was no evidence of significant perfusion abnormalities and he was felt to have NICM. He never followed up with cardiology besides the CHF clinic during which no changes were made to his CHF regimen. Admitted in mid May with cough and SOB, found to have Neisseria meningitis PNA and bacteremia. Cardiology saw patient for elevated troponin in the setting of septic shock. Echo showed an EF of 20-25%, diffuse, HK, RWMA could not be excluded, mild MR, LA moderately dilated, RV systolic function was normal, PASP was normal. CT chest revealed no coronary calcifications. He had short runs of NSVT on tele. He was started on amiodarone as an  outpatient in hospital follow up on 5/31. He wore a Holter monitor to evaluate for arrhythmia, which showed only atrial tach. He has not been wearing an event monitor as he was advised to hold off on this on 5/31.   Patient reports passing out at 3 PM on 6/5. Episode occurred after standing up from a sitting position and attempting to ambulate to his restroom. He was out for a matter of seconds and came to on his own. He had been feeling weak and nauseated since starting Coreg with amiodarone. No associated chest pain, SOB, vomiting, or diaphoresis.   Patient presented to [facility] On 6/5 with . Upon the patient's arrival to [facility] they were found to have negative troponin 0.04-->negative. Unremarkable CBC. K+ 3.2, SCr 1.27. Mg++ 1.5.  ECG showed sinus bradycardia, no acute st/t changes, CXR showed persistent but improved right upper lobe infiltrate. Continued follow up was advised. Orthostatics were not checked, though have been ordered by cardiology and are still pending.   Past Medical History  Diagnosis Date  . Hypertension     Resolved since weight loss  . Obesity (BMI 30.0-34.9)   . Psoriasis   . Diabetes mellitus without complication ([place])     Pre diabetic  . CHF (congestive heart failure) ([place])   . COPD (chronic obstructive pulmonary disease) ([place])       Most Recent Cardiac Studies: As above   Surgical History:  Past Surgical History  Procedure Laterality Date  . Amputation    . Finger amputation      Traumatic     Home Meds: Prior to Admission medications  Medication Sig Start Date End Date Taking? Authorizing Provider  amiodarone (PACERONE) 200 MG tablet Take 1 tablet (200 mg total) by mouth 2 (two) times daily. 01/17/16  Yes Minna Merritts, MD  aspirin 81 MG chewable tablet Chew 81 mg by mouth daily.   Yes Historical Provider, MD  carvedilol (COREG) 6.25 MG tablet Take 1 tablet (6.25 mg total) by mouth 2 (two) times daily. 01/17/16  Yes Minna Merritts, MD  furosemide (LASIX)  40 MG tablet Take 1 tablet (40 mg total) by mouth every other day. 01/09/16 01/08/17 Yes Demetrios Loll, MD  guaiFENesin-codeine 100-10 MG/5ML syrup Take 10 mLs by mouth every 4 (four) hours as needed for cough. 01/09/16  Yes Demetrios Loll, MD  hydrALAZINE (APRESOLINE) 50 MG tablet Take 1 tablet (50 mg total) by mouth every 8 (eight) hours. 01/09/16  Yes Demetrios Loll, MD  isosorbide mononitrate (IMDUR) 30 MG 24 hr tablet Take 1 tablet (30 mg total) by mouth daily. 01/09/16  Yes Demetrios Loll, MD  tamsulosin (FLOMAX) 0.4 MG CAPS capsule Take 0.4 mg by mouth daily after supper.   Yes Historical Provider, MD    Inpatient Medications:  . amiodarone  200 mg Oral BID  . aspirin  81 mg Oral Daily  . carvedilol  6.25 mg Oral BID WC  . furosemide  40 mg Oral QODAY  . heparin  5,000 Units Subcutaneous Q8H  . hydrALAZINE  50 mg Oral Q8H  . isosorbide mononitrate  30 mg Oral Daily  . magnesium oxide  400 mg Oral BID  . sodium chloride flush  3 mL Intravenous Q12H  . tamsulosin  0.4 mg Oral QPC supper      Allergies:  Allergies  Allergen Reactions  . Prednisone Other (See Comments)    Reaction: Hallucinations     Social History   Social History  . Marital Status: Divorced    Spouse Name: N/A  . Number of Children: N/A  . Years of Education: N/A   Occupational History  . Not on file.   Social History Main Topics  . Smoking status: Current Some Day Smoker -- 0.00 packs/day for 33 years    Types: Cigarettes    Last Attempt to Quit: 01/01/2016  . Smokeless tobacco: Never Used  . Alcohol Use: 1.8 oz/week    3 Cans of beer, 0 Standard drinks or equivalent per week     Comment: occassionally  . Drug Use: No  . Sexual Activity: Not on file   Other Topics Concern  . Not on file   Social History Narrative     Family History  Problem Relation Age of Onset  . Diabetes Mellitus II Mother   . Hypertension Father   . Cancer Paternal Aunt   . Cancer Maternal Grandfather      Review of Systems: Review  of Systems  Constitutional: Positive for malaise/fatigue. Negative for fever, chills, weight loss and diaphoresis.  HENT: Negative for congestion.   Eyes: Negative for discharge and redness.  Respiratory: Negative for cough, hemoptysis, sputum production, shortness of breath and wheezing.   Cardiovascular: Negative for chest pain, palpitations, orthopnea, claudication, leg swelling and PND.  Gastrointestinal: Positive for nausea and diarrhea. Negative for heartburn, vomiting, abdominal pain, blood in stool and melena.  Genitourinary: Negative for hematuria.  Musculoskeletal: Negative for myalgias and falls.  Skin: Negative for rash.  Neurological: Positive for dizziness and weakness. Negative for sensory change, speech change, focal weakness and loss of consciousness.  Endo/Heme/Allergies: Does not bruise/bleed  easily.  Psychiatric/Behavioral: The patient is not nervous/anxious.   All other systems reviewed and are negative.   Labs:  Recent Labs  01/22/16 1828 01/22/16 2228  TROPONINI 0.04* 0.03   Lab Results  Component Value Date   WBC 10.0 01/22/2016   HGB 15.4 01/22/2016   HCT 46.4 01/22/2016   MCV 82.2 01/22/2016   PLT 234 01/22/2016     Recent Labs Lab 01/22/16 1828 01/23/16 0411  NA 139 140  K 3.2* 3.7  CL 104 106  CO2 26 27  BUN 21* 25*  CREATININE 1.27* 1.32*  CALCIUM 8.5* 8.9  PROT 7.3  --   BILITOT 0.7  --   ALKPHOS 78  --   ALT 28  --   AST 25  --   GLUCOSE 99 110*   Lab Results  Component Value Date   CHOL 74 01/04/2016   HDL 14* 01/04/2016   LDLCALC 46 01/04/2016   TRIG 71 01/04/2016   No results found for: DDIMER  Radiology/Studies:  Dg Chest 1 View  01/04/2016  CLINICAL DATA:  Central line placement. EXAM: CHEST 1 VIEW COMPARISON:  Chest x-rays dated 01/03/2016, 12/14/2015 and 03/20/2015. FINDINGS: The right upper lobe consolidation is increasingly dense on today's exam, again compatible with pneumonia. There is a stable mild prominence of  the interstitial markings within each lung. Right IJ central line has been placed with tip well positioned at the level of the lower SVC. No pneumothorax. IMPRESSION: 1. Right IJ central line placement. Tip of the line appears well positioned at the level of the lower SVC. No pneumothorax or other complicating feature identified. 2. Right upper lobe pneumonia, increased in density compared to yesterday's chest x-ray. Electronically Signed   By: Franki Cabot M.D.   On: 01/04/2016 17:09   Dg Chest 2 View  01/03/2016  CLINICAL DATA:  Patient states that he has had worsening cough over past 2 weeks which has worsened today. Patient now has right side chest pain, SOB, diarrhea, and nausea. EXAM: CHEST  2 VIEW COMPARISON:  None. FINDINGS: There is right upper lobe consolidation. There is bilateral chronic interstitial lung disease. There is no pleural effusion or pneumothorax. The heart and mediastinal contours are unremarkable. The osseous structures are unremarkable. IMPRESSION: Right upper lobe pneumonia. Followup PA and lateral chest X-ray is recommended in 3-4 weeks following trial of antibiotic therapy to ensure resolution and exclude underlying malignancy. Electronically Signed   By: Kathreen Devoid   On: 01/03/2016 12:52   Dg Chest Port 1 View  01/22/2016  CLINICAL DATA:  Chest pain EXAM: PORTABLE CHEST 1 VIEW COMPARISON:  01/04/16 FINDINGS: Cardiac shadow is stable. The right jugular line is been removed in the interval. Some mild residual infiltrate is noted in the right upper lobe when compared with the prior exam but significantly improved. No new focal infiltrate is seen. Continued follow-up is recommended. No bony abnormality is noted. IMPRESSION: Persistent but significantly improved right upper lobe infiltrate. No new focal abnormality is seen. Continued follow-up is recommended. Electronically Signed   By: Inez Catalina M.D.   On: 01/22/2016 17:17   US Abdomen Limited Ruq  01/06/2016  CLINICAL DATA:   Jaundice. EXAM: US ABDOMEN LIMITED - RIGHT UPPER QUADRANT COMPARISON:  Multiple exams, including 11/19/2012 FINDINGS: Gallbladder: Gallbladder wall thickening at 5 mm. Small amount of pericholecystic fluid and perihepatic fluid. The dependent non shadowing filling defect in the gallbladder compatible with sludge. No visualized gallstones. Common bile duct: Diameter: 5 mm.  No directly visualized choledocholithiasis. Liver: No focal lesion identified. Coarse echogenic liver with poor sonic penetration compatible with diffuse hepatic steatosis. Incidentally, the right kidney appears to be slightly more echogenic than the liver. IMPRESSION: 1. Gallbladder wall thickening with pericholecystic fluid. Sludge in the gallbladder without sonographic Murphy sign or gallstones. Correlate clinically in assessing for acute cholecystitis. 2. Small amount of perihepatic ascites. 3. Coarse echogenic liver with poor sonic penetration compatible with diffuse hepatic steatosis. 4. Echogenic right kidney, nonspecific. Correlate with renal function is in assessing for glomerulonephritis or other potential causes. Electronically Signed   By: Van Clines M.D.   On: 01/06/2016 08:51    EKG: Interpreted by me showed: sinus bradycardia, no acute st/t changes Telemetry: Interpreted by me showed: NSR 70's bpm  Weights: Filed Weights   01/22/16 1616 01/23/16 0019  Weight: 230 lb (104.327 kg) 227 lb 12.8 oz (103.329 kg)     Physical Exam: Blood pressure 137/91, pulse 66, temperature 98 F (36.7 C), temperature source Oral, resp. rate 18, height 6\' 1"  (1.854 m), weight 227 lb 12.8 oz (103.329 kg), SpO2 97 %. Body mass index is 30.06 kg/(m^2). General: Well developed, well nourished, in no acute distress. Head: Normocephalic, atraumatic, sclera non-icteric, no xanthomas, nares are without discharge.  Neck: Negative for carotid bruits. JVD not elevated. Lungs: Clear bilaterally to auscultation without wheezes, rales, or  rhonchi. Breathing is unlabored. Heart: RRR with S1 S2. No murmurs, rubs, or gallops appreciated. Abdomen: Soft, non-tender, non-distended with normoactive bowel sounds. No hepatomegaly. No rebound/guarding. No obvious abdominal masses. Msk:  Strength and tone appear normal for age. Extremities: No clubbing or cyanosis. No edema. Distal pedal pulses are 2+ and equal bilaterally. Neuro: Alert and oriented X 3. No facial asymmetry. No focal deficit. Moves all extremities spontaneously. Psych:  Responds to questions appropriately with a normal affect.    Assessment and Plan:  Principal Problem:   Orthostatic hypotension Active Problems:   Syncope   Diarrhea   Hypokalemia   Hypomagnesemia   Chronic systolic CHF (congestive heart failure) ([place])    1. Syncope: -Suspect orthostatic hypotension given his history of medications with volume loss 2/2 diarrhea  -Would replete fluids, per IM -Have ordered orthostatic vital signs, pending at this time -Decrease Coreg at this time to 6.25 mg bid -Recent echo 12/2015, no need to repeat -Monitor on telemetry -Advised not to wear event monitor on 5/31. If ventricular ectopy is seen may need this  2. Chronic systolic CHF: -He does not appear to be volume overloaded -Coreg as above, otherwise continue CHF medications  3. History of NSVT: -Continue amiodarone -Coreg as above -Event monitor as an outpatient -Some degree of ventricular ectopy is to be expected given his cardiomyopathy -Will need to titrate CHF medications with compliance prior to ICD -Will also need cardiac cath prior to possible ICD  4. HTN: -Improved   5. Diarrhea: -Likely playing a role in #1 -Per IM  6. Hypokalemia/hypomagnesemia: -Replete to goal   Signed, Christell Faith, PA-C [place] Pager: [phone] 01/23/2016, 9:29 AM

## 2016-01-23 NOTE — Progress Notes (Signed)
Follow-up appointment scheduled at the [facility] on February 13, 2016 at 9:30am. Thank you.

## 2016-02-08 ENCOUNTER — Encounter: Payer: Self-pay | Admitting: Cardiovascular Disease

## 2016-02-08 ENCOUNTER — Ambulatory Visit (INDEPENDENT_AMBULATORY_CARE_PROVIDER_SITE_OTHER): Payer: 59 | Admitting: Cardiovascular Disease

## 2016-02-08 VITALS — BP 186/129 | HR 83 | Ht 73.0 in | Wt 237.5 lb

## 2016-02-08 DIAGNOSIS — I472 Ventricular tachycardia: Secondary | ICD-10-CM | POA: Diagnosis not present

## 2016-02-08 DIAGNOSIS — I10 Essential (primary) hypertension: Secondary | ICD-10-CM

## 2016-02-08 DIAGNOSIS — I5022 Chronic systolic (congestive) heart failure: Secondary | ICD-10-CM

## 2016-02-08 DIAGNOSIS — I429 Cardiomyopathy, unspecified: Secondary | ICD-10-CM | POA: Diagnosis not present

## 2016-02-08 DIAGNOSIS — R55 Syncope and collapse: Secondary | ICD-10-CM

## 2016-02-08 DIAGNOSIS — A419 Sepsis, unspecified organism: Secondary | ICD-10-CM

## 2016-02-08 DIAGNOSIS — I4729 Other ventricular tachycardia: Secondary | ICD-10-CM

## 2016-02-08 MED ORDER — FUROSEMIDE 40 MG PO TABS: 40.0000 mg | ORAL_TABLET | ORAL | Status: DC

## 2016-02-08 MED ORDER — HYDRALAZINE HCL 50 MG PO TABS: 50.0000 mg | ORAL_TABLET | Freq: Three times a day (TID) | ORAL | Status: DC

## 2016-02-08 MED ORDER — ISOSORBIDE MONONITRATE ER 30 MG PO TB24: 30.0000 mg | ORAL_TABLET | Freq: Every day | ORAL | Status: DC

## 2016-02-08 MED ORDER — AMIODARONE HCL 200 MG PO TABS: 200.0000 mg | ORAL_TABLET | Freq: Two times a day (BID) | ORAL | Status: DC

## 2016-02-08 NOTE — Progress Notes (Signed)
Patient ID: Gabriel [name]., male   DOB: [birth year], 44 y.o.   MRN: [id number] Cardiology Office Note  Date:  02/08/2016   ID:  Gabriel [name]., DOB [birth year], MRN [id number]  PCP:  No PCP Per Patient   Chief Complaint  Patient presents with  . other    Syncope c/o d/c amiodarone and carvedilol. Pt has not taken all cardiac meds x1 wk. Meds reviewed verbally with pt.    HPI:  45 y.o. male with h/o nonischemic cardiomyopathy by nuclear stress test in [id number], chronic systolic CHF, COPD, ongoing tobacco abuse at 2 ppd since age 2, DM2, and obesity who presented to [facility] on 01/03/16 with cough and SOB, septic shock, right upper lobe pneumonia, fevers and chills. He presents to the office after recent hospital discharge following septic shock in the setting of right upper lobe pneumonia.   Echocardiogram in the hospital showed severely depressed ejection fraction 25% CXR showed right upper lobe consolidation c/w pneumonia.  started on IV Rocephin and azithromycin.  Troponin trend of 0.22-->0.19-->0.18-->1.05-->0.13 WBC count of 28,000 on arrival Febrile to a T max of 101.2 this admission.  Hypotensive with BP down to the 70's shortly after admission,   Prior to discharge was started on hydralazine, isosorbide, carvedilol. Secondary to acute renal failure, was not started on ACE inhibitor or ARB He did have short runs of nonsustained VT seen on telemetry On his last clinic visit, he was not on carvedilol  On his last clinic visit we started carvedilol and amiodarone for nonsustained VT on Holter He did not tolerate these medications, had a headache  Early June, approximately 2 weeks ago, had episode of syncope in his kitchen Denied any lightheadedness or dizziness, no warning, When out for a minute witnessed by his girlfriend, woke up on the floor Went to the hospital  Presents today on none of his medications, systolic pressures [id number], up to 180 Feels the carvedilol or  amiodarone gave him a headache, ran out of his other medications, did not call for refill  He is trying to get Medicaid, disability Was unable to afford a 30 day monitor as he had no insurance  On today's visit feels fine, no leg edema, weight is back up to close to his baseline after weight loss from the sepsis. No further episodes of near syncope or syncope  EKG on today's visit shows normal sinus rhythm with rate 91 bpm, no significant ST or T-wave changes  Other past medical history  He wore a 48-hour Holter monitor which was reviewed with him in detail today Monitor shows short rare runs of atrial tachycardia, also short rare episodes of nonsustained VT. He does report rare fluttering in his chest like a vibration otherwise no significant symptoms  Patient was previously admitted in August 2016 and September 2016 with atypical chest pain.  Echo during August admission showed mildly reduced LV systolic function with mildly elevated troponin and BNP. He was hypertensive and unable to afford his medications at that time.   He was discharged back in August 2016 on antihypertensives, but did nto take them. During his September 2016 admission for atypical chest pain and HTN he underwent nuclear stress testing that showed a small defect of mild severity in the apex location felt to be 2/2 apical thinning. EF was estimated at 30-44%. Overall, this was an intermediate risk scan. There was no evidence of significant perfusion abnormalities and he was felt to have NICM.   PMH:  has a past medical history of Hypertension; Obesity (BMI 30.0-34.9); Psoriasis; Diabetes mellitus without complication (HCC); CHF (congestive heart failure) ([place]); and COPD (chronic obstructive pulmonary disease) ([place]).  PSH:    Past Surgical History  Procedure Laterality Date  . Amputation    . Finger amputation      Traumatic    Current Outpatient Prescriptions  Medication Sig Dispense Refill  . aspirin 81 MG  chewable tablet Chew 81 mg by mouth daily. Reported on 02/08/2016    . amiodarone (PACERONE) 200 MG tablet Take 1 tablet (200 mg total) by mouth 2 (two) times daily. 70 tablet 3  . furosemide (LASIX) 40 MG tablet Take 1 tablet (40 mg total) by mouth every other day. 90 tablet 2  . hydrALAZINE (APRESOLINE) 50 MG tablet Take 1 tablet (50 mg total) by mouth every 8 (eight) hours. 270 tablet 3  . isosorbide mononitrate (IMDUR) 30 MG 24 hr tablet Take 1 tablet (30 mg total) by mouth daily. 90 tablet 3   No current facility-administered medications for this visit.     Allergies:   Prednisone   Social History:  The patient  reports that he has been smoking Cigarettes.  He has been smoking about 0.00 packs per day for the past 33 years. He has never used smokeless tobacco. He reports that he drinks about 1.8 oz of alcohol per week. He reports that he does not use illicit drugs.   Family History:   family history includes Cancer in his maternal grandfather and paternal aunt; Diabetes Mellitus II in his mother; Hypertension in his father.    Review of Systems: Review of Systems  Constitutional: Negative.   Respiratory: Negative.   Cardiovascular: Negative.   Gastrointestinal: Negative.   Musculoskeletal: Negative.   Neurological: Positive for loss of consciousness.  Psychiatric/Behavioral: Negative.   All other systems reviewed and are negative.    PHYSICAL EXAM: VS:  BP 186/129 mmHg  Pulse 83  Ht 6\' 1"  (1.854 m)  Wt 237 lb 8 oz (107.729 kg)  BMI 31.34 kg/m2 , BMI Body mass index is 31.34 kg/(m^2). GEN: Well nourished, well developed, in no acute distress HEENT: normal Neck: no JVD, carotid bruits, or masses Cardiac: RRR; no murmurs, rubs, or gallops,no edema  Respiratory:  clear to auscultation bilaterally, normal work of breathing GI: soft, nontender, nondistended, + BS MS: no deformity or atrophy Skin: warm and dry, no rash Neuro:  Strength and sensation are intact Psych:  euthymic mood, full affect    Recent Labs: 03/20/2015: TSH 2.367 05/17/2015: B Natriuretic Peptide 1103.0* 01/22/2016: ALT 28; Hemoglobin 15.4; Magnesium 1.5*; Platelets 234 01/23/2016: BUN 25*; Creatinine, Ser 1.32*; Potassium 3.7; Sodium 140    Lipid Panel Lab Results  Component Value Date   CHOL 74 01/04/2016   HDL 14* 01/04/2016   LDLCALC 46 01/04/2016   TRIG 71 01/04/2016      Wt Readings from Last 3 Encounters:  02/08/16 237 lb 8 oz (107.729 kg)  01/23/16 227 lb 12.8 oz (103.329 kg)  01/17/16 230 lb (104.327 kg)       ASSESSMENT AND PLAN:  Syncope, unspecified syncope type Etiology of his syncope is concerning for arrhythmia, sustained VT He did not tolerate amiodarone or at least he stopped this on his own secondary to headache Headache was possibly secondary to carvedilol though he is not sure He is unable to afford a 30 day monitor to confirm sustained VT Recommended he restart amiodarone 200 mg twice a day We'll hold off  on starting the carvedilol given this math caused his headache  Cardiomyopathy ([place]) - Plan: ECHOCARDIOGRAM COMPLETE We will place him back on isosorbide, hydralazine. He reports he tolerated these medications without significant side effects Echocardiogram scheduled to confirm ejection fraction in the setting of nonsustained VT, prior cardiomyopathy in the setting of sepsis -Long history of nonischemic cardiopathy, last stress test September 2000  Essential hypertension Blood pressure very elevated on today's visit, as well as controlled on his last clinic visit on isosorbide and hydralazine Will restart these medications  NSVT (nonsustained ventricular tachycardia) (HCC) Nonsustained VT seen in the hospital in May and again on Holter Possibly responsible for his recent episode of syncope He cannot afford 30 day monitor as above, Will restart amiodarone Would likely benefit from referral to Dr. Caryl Comes, EP  Chronic systolic heart failure  ([place]) Recommended he continue Lasix every other day as he was doing prior to recent hospitalizations, appears euvolemic on today's visit  Sepsis, due to unspecified organism ([place]) Seems to have recovered from his sepsis, weight has trended back upwards   Disposition:   F/U  1 months   Total encounter time more than 25 minutes  Greater than 50% was spent in counseling and coordination of care with the patient    Orders Placed This Encounter  Procedures  . ECHOCARDIOGRAM COMPLETE     Signed, Esmond Plants, M.D., Ph.D. 02/08/2016  [place], [place]

## 2016-02-08 NOTE — Patient Instructions (Addendum)
Call the office for any episodes of syncope  Medication Instructions:   Please restart medications on the list  Testing/Procedures: Echocardiogram for cardiomyopathy Echocardiography is a painless test that uses sound waves to create images of your heart. It provides your doctor with information about the size and shape of your heart and how well your heart's chambers and valves are working. This procedure takes approximately one hour. There are no restrictions for this procedure.  Follow-Up: It was a pleasure seeing you in the office today. Please call us if you have new issues that need to be addressed before your next appt.  [phone]  Your physician wants you to follow-up in: 1 month.  If you need a refill on your cardiac medications before your next appointment, please call your pharmacy.    Echocardiogram An echocardiogram, or echocardiography, uses sound waves (ultrasound) to produce an image of your heart. The echocardiogram is simple, painless, obtained within a short period of time, and offers valuable information to your health care provider. The images from an echocardiogram can provide information such as:  Evidence of coronary artery disease (CAD).  Heart size.  Heart muscle function.  Heart valve function.  Aneurysm detection.  Evidence of a past heart attack.  Fluid buildup around the heart.  Heart muscle thickening.  Assess heart valve function. LET [facility] CARE PROVIDER KNOW ABOUT:  Any allergies you have.  All medicines you are taking, including vitamins, herbs, eye drops, creams, and over-the-counter medicines.  Previous problems you or members of your family have had with the use of anesthetics.  Any blood disorders you have.  Previous surgeries you have had.  Medical conditions you have.  Possibility of pregnancy, if this applies. BEFORE THE PROCEDURE  No special preparation is needed. Eat and drink normally.  PROCEDURE   In  order to produce an image of your heart, gel will be applied to your chest and a wand-like tool (transducer) will be moved over your chest. The gel will help transmit the sound waves from the transducer. The sound waves will harmlessly bounce off your heart to allow the heart images to be captured in real-time motion. These images will then be recorded.  You may need an IV to receive a medicine that improves the quality of the pictures. AFTER THE PROCEDURE You may return to your normal schedule including diet, activities, and medicines, unless your health care provider tells you otherwise.   This information is not intended to replace advice given to you by your health care provider. Make sure you discuss any questions you have with your health care provider.   Document Released: 08/02/2000 Document Revised: 08/26/2014 Document Reviewed: 04/12/2013 Elsevier Interactive Patient Education [employer].

## 2016-02-13 ENCOUNTER — Ambulatory Visit: Payer: 59 | Admitting: Family

## 2016-02-14 ENCOUNTER — Telehealth: Payer: Self-pay | Admitting: Cardiovascular Disease

## 2016-02-14 NOTE — Telephone Encounter (Signed)
Received records request from [place] , forwarded to [facility] for processing. Scanned into chart and faxed over to [place].

## 2016-02-15 ENCOUNTER — Emergency Department: Payer: Self-pay

## 2016-02-15 ENCOUNTER — Encounter: Payer: Self-pay | Admitting: Emergency Medicine

## 2016-02-15 ENCOUNTER — Inpatient Hospital Stay
Admission: EM | Admit: 2016-02-15 | Discharge: 2016-02-17 | DRG: 190 | Disposition: A | Discharge status: Home / Self Care | Payer: Self-pay | Attending: Internal Medicine | Admitting: Internal Medicine

## 2016-02-15 DIAGNOSIS — I110 Hypertensive heart disease with heart failure: Secondary | ICD-10-CM | POA: Diagnosis present

## 2016-02-15 DIAGNOSIS — J189 Pneumonia, unspecified organism: Secondary | ICD-10-CM | POA: Diagnosis present

## 2016-02-15 DIAGNOSIS — E669 Obesity, unspecified: Secondary | ICD-10-CM | POA: Diagnosis present

## 2016-02-15 DIAGNOSIS — Z888 Allergy status to other drugs, medicaments and biological substances status: Secondary | ICD-10-CM

## 2016-02-15 DIAGNOSIS — E119 Type 2 diabetes mellitus without complications: Secondary | ICD-10-CM | POA: Diagnosis present

## 2016-02-15 DIAGNOSIS — R0789 Other chest pain: Secondary | ICD-10-CM | POA: Diagnosis present

## 2016-02-15 DIAGNOSIS — I5022 Chronic systolic (congestive) heart failure: Secondary | ICD-10-CM | POA: Diagnosis present

## 2016-02-15 DIAGNOSIS — E8881 Metabolic syndrome: Secondary | ICD-10-CM | POA: Diagnosis present

## 2016-02-15 DIAGNOSIS — J440 Chronic obstructive pulmonary disease with acute lower respiratory infection: Principal | ICD-10-CM | POA: Diagnosis present

## 2016-02-15 DIAGNOSIS — F17210 Nicotine dependence, cigarettes, uncomplicated: Secondary | ICD-10-CM | POA: Diagnosis present

## 2016-02-15 DIAGNOSIS — Z6830 Body mass index (BMI) 30.0-30.9, adult: Secondary | ICD-10-CM

## 2016-02-15 DIAGNOSIS — I472 Ventricular tachycardia: Secondary | ICD-10-CM | POA: Diagnosis present

## 2016-02-15 DIAGNOSIS — E876 Hypokalemia: Secondary | ICD-10-CM | POA: Diagnosis present

## 2016-02-15 DIAGNOSIS — L309 Dermatitis, unspecified: Secondary | ICD-10-CM | POA: Diagnosis present

## 2016-02-15 DIAGNOSIS — Z833 Family history of diabetes mellitus: Secondary | ICD-10-CM

## 2016-02-15 DIAGNOSIS — Z79899 Other long term (current) drug therapy: Secondary | ICD-10-CM

## 2016-02-15 DIAGNOSIS — Z89029 Acquired absence of unspecified finger(s): Secondary | ICD-10-CM

## 2016-02-15 DIAGNOSIS — Z7982 Long term (current) use of aspirin: Secondary | ICD-10-CM

## 2016-02-15 DIAGNOSIS — Z8249 Family history of ischemic heart disease and other diseases of the circulatory system: Secondary | ICD-10-CM

## 2016-02-15 DIAGNOSIS — L409 Psoriasis, unspecified: Secondary | ICD-10-CM | POA: Diagnosis present

## 2016-02-15 LAB — COMPREHENSIVE METABOLIC PANEL
ALT: 21 U/L (ref 17–63)
AST: 23 U/L (ref 15–41)
Albumin: 3.7 g/dL (ref 3.5–5.0)
Alkaline Phosphatase: 83 U/L (ref 38–126)
Anion gap: 10 (ref 5–15)
BUN: 24 mg/dL — ABNORMAL HIGH (ref 6–20)
CO2: 22 mmol/L (ref 22–32)
Calcium: 8.8 mg/dL — ABNORMAL LOW (ref 8.9–10.3)
Chloride: 106 mmol/L (ref 101–111)
Creatinine, Ser: 1.14 mg/dL (ref 0.61–1.24)
GFR calc Af Amer: >60 mL/min (ref >60)
GFR calc non Af Amer: >60 mL/min (ref >60)
Glucose, Bld: 140 mg/dL — ABNORMAL HIGH (ref 65–99)
Potassium: 3 mmol/L — ABNORMAL LOW (ref 3.5–5.1)
Sodium: 138 mmol/L (ref 135–145)
Total Bilirubin: 0.7 mg/dL (ref 0.3–1.2)
Total Protein: 7.5 g/dL (ref 6.5–8.1)

## 2016-02-15 LAB — CBC
HCT: 39.2 % — ABNORMAL LOW (ref 40.0–52.0)
Hemoglobin: 13.5 g/dL (ref 13.0–18.0)
MCH: 27.8 pg (ref 26.0–34.0)
MCHC: 34.4 g/dL (ref 32.0–36.0)
MCV: 80.9 fL (ref 80.0–100.0)
Platelets: 175 10*3/uL (ref 150–440)
RBC: 4.85 MIL/uL (ref 4.40–5.90)
RDW: 16.1 % — ABNORMAL HIGH (ref 11.5–14.5)
WBC: 10.2 10*3/uL (ref 3.8–10.6)

## 2016-02-15 LAB — TROPONIN I: Troponin I: 0.05 ng/mL (ref <0.03)

## 2016-02-15 MED ORDER — ONDANSETRON HCL 4 MG/2ML IJ SOLN
INTRAMUSCULAR | Status: AC
  Filled 2016-02-15: qty 2

## 2016-02-15 MED ORDER — MORPHINE SULFATE (PF) 4 MG/ML IV SOLN
INTRAVENOUS | Status: AC
  Filled 2016-02-15: qty 1

## 2016-02-15 MED ORDER — MORPHINE SULFATE (PF) 4 MG/ML IV SOLN
4.0000 mg | Freq: Once | INTRAVENOUS | Status: AC
  Administered 2016-02-15: 4 mg via INTRAVENOUS

## 2016-02-15 MED ORDER — HYDROMORPHONE HCL 1 MG/ML IJ SOLN
1.0000 mg | Freq: Once | INTRAMUSCULAR | Status: AC
  Administered 2016-02-15: 1 mg via INTRAVENOUS

## 2016-02-15 MED ORDER — NITROGLYCERIN 0.4 MG SL SUBL
SUBLINGUAL_TABLET | SUBLINGUAL | Status: AC
  Filled 2016-02-15: qty 1

## 2016-02-15 MED ORDER — HYDROMORPHONE HCL 1 MG/ML IJ SOLN
INTRAMUSCULAR | Status: AC
  Filled 2016-02-15: qty 1

## 2016-02-15 MED ORDER — ASPIRIN 81 MG PO CHEW
324.0000 mg | CHEWABLE_TABLET | Freq: Once | ORAL | Status: AC
  Administered 2016-02-15: 324 mg via ORAL

## 2016-02-15 MED ORDER — ASPIRIN 81 MG PO CHEW
CHEWABLE_TABLET | ORAL | Status: AC
  Filled 2016-02-15: qty 4

## 2016-02-15 MED ORDER — LEVOFLOXACIN IN D5W 500 MG/100ML IV SOLN
500.0000 mg | Freq: Once | INTRAVENOUS | Status: AC
  Administered 2016-02-15: 500 mg via INTRAVENOUS
  Filled 2016-02-15: qty 100

## 2016-02-15 MED ORDER — IOPAMIDOL (ISOVUE-370) INJECTION 76%
100.0000 mL | Freq: Once | INTRAVENOUS | Status: AC | PRN
  Administered 2016-02-15: 100 mL via INTRAVENOUS

## 2016-02-15 MED ORDER — ONDANSETRON HCL 4 MG/2ML IJ SOLN
4.0000 mg | Freq: Once | INTRAMUSCULAR | Status: AC
  Administered 2016-02-15: 4 mg via INTRAVENOUS

## 2016-02-15 MED ORDER — NITROGLYCERIN 0.4 MG SL SUBL
0.4000 mg | SUBLINGUAL_TABLET | Freq: Once | SUBLINGUAL | Status: AC
  Administered 2016-02-15: 0.4 mg via SUBLINGUAL

## 2016-02-15 NOTE — ED Notes (Signed)
Pt. States sudden onset of lt. Sided chest pain with shortness of breath.  Pt. States cardiac hx.

## 2016-02-15 NOTE — ED Notes (Signed)
Pt placed on 2L [place] per MD Owens Shark request

## 2016-02-15 NOTE — ED Notes (Signed)
MD Brown at bedside.

## 2016-02-15 NOTE — ED Notes (Signed)
Patient transported to CT 

## 2016-02-15 NOTE — ED Provider Notes (Signed)
[facility] Emergency Department Provider Note  ____________________________________________  Time seen: 10:50 PM  I have reviewed the triage vital signs and the nursing notes.   HISTORY  Chief Complaint Chest Pain      HPI Gabriel [name]. is a 45 y.o. male history of hypertension diabetes CHF and COPD presents with acute onset of persistent chest pain since 9 PM tonight. Patient states that pain is located in his left chest radiating to the right and described as sharp current pain score is 7 out of 10 but at its maximum was 10 out of 10. Patient received IV morphine and nitroglycerin before my evaluation which improved his pain. Patient admits to dyspnea and nonproductive cough.     Past Medical History  Diagnosis Date  . Hypertension     Resolved since weight loss  . Obesity (BMI 30.0-34.9)   . Psoriasis   . Diabetes mellitus without complication ([place])     Pre diabetic  . CHF (congestive heart failure) ([place])   . COPD (chronic obstructive pulmonary disease) [facility])     Patient Active Problem List   Diagnosis Date Noted  . Orthostatic hypotension 01/23/2016  . Diarrhea 01/23/2016  . Hypokalemia 01/23/2016  . Hypomagnesemia 01/23/2016  . Chronic systolic CHF (congestive heart failure) ([place]) 01/23/2016  . Syncope 01/22/2016  . 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 NSVT (nonsustained ventricular tachycardia) ([place])   . 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 CAP (community acquired pneumonia)   . Arterial hypotension   . Elevated troponin   . Chronic systolic heart failure ([place])   . Sepsis ([place]) 01/03/2016  . Type II diabetes mellitus ([place]) 05/17/2015  . Hyperlipidemia 05/17/2015  . COPD (chronic obstructive pulmonary disease) ([place]) 05/17/2015  . HTN (hypertension) 03/31/2015  . Smoking 03/31/2015  . CHF (congestive heart failure) ([place]) 03/20/2015    Past Surgical History  Procedure Laterality Date  . Amputation     . Finger amputation      Traumatic    Current Outpatient Rx  Name  Route  Sig  Dispense  Refill  . amiodarone (PACERONE) 200 MG tablet   Oral   Take 1 tablet (200 mg total) by mouth 2 (two) times daily.   70 tablet   3   . aspirin 81 MG chewable tablet   Oral   Chew 81 mg by mouth daily. Reported on 02/08/2016         . furosemide (LASIX) 40 MG tablet   Oral   Take 1 tablet (40 mg total) by mouth every other day.   90 tablet   2     40 mg po every other day, can take 40 mg in betwee ...   . hydrALAZINE (APRESOLINE) 50 MG tablet   Oral   Take 1 tablet (50 mg total) by mouth every 8 (eight) hours.   270 tablet   3   . isosorbide mononitrate (IMDUR) 30 MG 24 hr tablet   Oral   Take 1 tablet (30 mg total) by mouth daily.   90 tablet   3     Allergies Prednisone  Family History  Problem Relation Age of Onset  . Diabetes Mellitus II Mother   . Hypertension Father   . Cancer Paternal Aunt   . Cancer Maternal Grandfather     Social History Social History  Substance Use Topics  . Smoking status: Current Some Day Smoker -- 0.00 packs/day for 33 years    Types:  Cigarettes    Last Attempt to Quit: 01/01/2016  . Smokeless tobacco: Never Used  . Alcohol Use: 1.8 oz/week    3 Cans of beer, 0 Standard drinks or equivalent per week     Comment: occassionally    Review of Systems  Constitutional: Negative for fever. Eyes: Negative for visual changes. ENT: Negative for sore throat. Cardiovascular: Positive for chest pain. Respiratory: Positive for cough and dyspnea Gastrointestinal: Negative for abdominal pain, vomiting and diarrhea. Genitourinary: Negative for dysuria. Musculoskeletal: Negative for back pain. Skin: Negative for rash. Neurological: Negative for headaches, focal weakness or numbness.   10-point ROS otherwise negative.  ____________________________________________   PHYSICAL EXAM:  VITAL SIGNS: ED Triage Vitals  Enc Vitals Group      BP 02/15/16 2205 169/98 mmHg     Pulse Rate 02/15/16 2205 78     Resp 02/15/16 2205 20     Temp 02/15/16 2205 98.2 F (36.8 C)     Temp src --      SpO2 02/15/16 2205 97 %     Weight 02/15/16 2205 237 lb (107.502 kg)     Height 02/15/16 2205 6\' 1"  (1.854 m)     Head Cir --      Peak Flow --      Pain Score 02/15/16 2207 10     Pain Loc --      Pain Edu? --      Excl. in [place]? --      Constitutional: Alert and oriented. Well appearing and in no distress. Eyes: Conjunctivae are normal. PERRL. Normal extraocular movements. ENT   Head: Normocephalic and atraumatic.   Nose: No congestion/rhinnorhea.   Mouth/Throat: Mucous membranes are moist.   Neck: No stridor. Hematological/Lymphatic/Immunilogical: No cervical lymphadenopathy. Cardiovascular: Normal rate, regular rhythm. Normal and symmetric distal pulses are present in all extremities. No murmurs, rubs, or gallops. Respiratory: Normal respiratory effort without tachypnea nor retractions. Breath sounds are clear and equal bilaterally. No wheezes/rales/rhonchi. Gastrointestinal: Soft and nontender. No distention. There is no CVA tenderness. Genitourinary: deferred Musculoskeletal: Nontender with normal range of motion in all extremities. No joint effusions.  No lower extremity tenderness nor edema. Neurologic:  Normal speech and language. No gross focal neurologic deficits are appreciated. Speech is normal.  Skin:  Skin is warm, dry and intact. No rash noted. Psychiatric: Mood and affect are normal. Speech and behavior are normal. Patient exhibits appropriate insight and judgment.  ____________________________________________    LABS (pertinent positives/negatives)  Labs Reviewed  CBC - Abnormal; Notable for the following:    HCT 39.2 (*)    RDW 16.1 (*)    All other components within normal limits  COMPREHENSIVE METABOLIC PANEL - Abnormal; Notable for the following:    Potassium 3.0 (*)    Glucose, Bld 140 (*)     BUN 24 (*)    Calcium 8.8 (*)    All other components within normal limits  TROPONIN I - Abnormal; Notable for the following:    Troponin I 0.05 (*)    All other components within normal limits  TROPONIN I - Abnormal; Notable for the following:    Troponin I 0.06 (*)    All other components within normal limits  TSH - Abnormal; Notable for the following:    TSH 4.694 (*)    All other components within normal limits  TROPONIN I  TROPONIN I  HEMOGLOBIN A1C     ____________________________________________   EKG  ED ECG REPORT I, [place] N Dawid Dupriest, the  attending physician, personally viewed and interpreted this ECG.   Date: 02/16/2016  EKG Time: 10:59 PM  Rate: 91  Rhythm: Normal sinus rhythm  Axis: Normal  Intervals: Normal  ST&T Change: None   ____________________________________________    RADIOLOGY  CT Angio Chest PE W/Cm &/Or Wo Cm (Final result) Result time: 02/15/16 23:59:38   Final result by Rad Results In Interface (02/15/16 23:59:38)   Narrative:   CLINICAL DATA: 45 year old male With sudden onset left-sided chest pain and shortness of breath  EXAM: CT ANGIOGRAPHY CHEST WITH CONTRAST  TECHNIQUE: Multidetector CT imaging of the chest was performed using the standard protocol during bolus administration of intravenous contrast. Multiplanar CT image reconstructions and MIPs were obtained to evaluate the vascular anatomy.  CONTRAST: 100 cc Isovue 370  COMPARISON: Chest radiograph dated 02/15/2016  FINDINGS: There is a focal area of pleural based consolidative change with air bronchograms in the posterior segment of the right upper lobe most likely pneumonia. Underlying mass is less likely given sudden onset a rapid progression but not excluded. Follow-up to resolution is recommended. There is diffuse interstitial prominence and edema. There is diffuse mosaic appearance of the lungs with areas of air trapping likely related to underlying  small vessel versus small airway disease. Small right pleural effusion. There is no pneumothorax. The central airways are patent.  The thoracic aorta appears unremarkable. No CT evidence of pulmonary embolism. Top-normal cardiac size. No pericardial effusion. There is coronary vascular calcification. Right hilar adenopathy. Multiple top-normal lymph nodes in the prevascular space. Right paratracheal adenopathy measures 14 mm in short axis. The esophagus is grossly unremarkable. No thyroid nodules identified.  There is no axillary adenopathy. The chest wall soft tissues appear unremarkable. There is degenerative changes of the spine. No acute fracture. A 1 cm sclerotic lesion in the right third rib likely represents a bone island.  The visualized upper abdomen appear unremarkable.  Review of the MIP images confirms the above findings.  IMPRESSION: No CT evidence of pulmonary embolism.  Focal consolidative changes of the right upper lobe most likely pneumonia. Clinical correlation and follow-up to resolution recommended.  Mild interstitial edema and small right pleural effusion.  Right hilar and mediastinal adenopathy as seen on the prior study. Clinical correlation is recommended.   Electronically Signed By: Anner Crete M.D. On: 02/15/2016 23:59          DG Chest 2 View (Final result) Result time: 02/15/16 22:58:21   Final result by Rad Results In Interface (02/15/16 22:58:21)   Narrative:   CLINICAL DATA: Sudden onset of left-sided chest pain and dyspnea.  EXAM: CHEST 2 VIEW  COMPARISON: 01/22/2016  FINDINGS: Persistent right upper lobe airspace opacity, unchanged from 01/22/2016 although it has improved from 01/04/2016. Left lung is clear. No pleural effusions. Normal pulmonary vasculature. Unremarkable hilar and mediastinal contours.  IMPRESSION: Persistent right upper lobe airspace opacity consistent with pneumonia, improved but not  completely resolved. No new abnormalities. Continued follow-up radiography is recommended to confirm complete clearance of the right upper lobe abnormality.   Electronically Signed By: Andreas Newport M.D. On: 02/15/2016 22:58      Procedures    ____________________________________________   INITIAL IMPRESSION / ASSESSMENT AND PLAN / ED COURSE  Pertinent labs & imaging results that were available during my care of the patient were reviewed by me and considered in my medical decision making (see chart for details).  Patient received IV Levaquin 500 mg given history physical exam and chest x-ray findings. Patient discussed with  Dr. Jannifer Franklin for hospital admission for further evaluation and management  ____________________________________________   FINAL CLINICAL IMPRESSION(S) / ED DIAGNOSES  Final diagnoses:  None  Community-acquired pneumonia    Gregor Hams, MD 02/16/16 [phone]

## 2016-02-16 DIAGNOSIS — R0789 Other chest pain: Secondary | ICD-10-CM | POA: Diagnosis present

## 2016-02-16 LAB — GLUCOSE, CAPILLARY
Glucose-Capillary: 102 mg/dL — ABNORMAL HIGH (ref 65–99)
Glucose-Capillary: 111 mg/dL — ABNORMAL HIGH (ref 65–99)
Glucose-Capillary: 104 mg/dL — ABNORMAL HIGH (ref 65–99)
Glucose-Capillary: 100 mg/dL — ABNORMAL HIGH (ref 65–99)

## 2016-02-16 LAB — TSH: TSH: 4.694 u[IU]/mL — ABNORMAL HIGH (ref 0.350–4.500)

## 2016-02-16 LAB — TROPONIN I
Troponin I: 0.06 ng/mL (ref <0.03)
Troponin I: 0.06 ng/mL (ref <0.03)
Troponin I: 0.04 ng/mL (ref <0.03)

## 2016-02-16 LAB — MAGNESIUM: Magnesium: 1.8 mg/dL (ref 1.7–2.4)

## 2016-02-16 LAB — HEMOGLOBIN A1C: Hgb A1c MFr Bld: 6 % (ref 4.0–6.0)

## 2016-02-16 MED ORDER — INSULIN ASPART 100 UNIT/ML ~~LOC~~ SOLN: 0.0000 [IU] | Freq: Every day | SUBCUTANEOUS | Status: DC

## 2016-02-16 MED ORDER — HYDRALAZINE HCL 50 MG PO TABS
50.0000 mg | ORAL_TABLET | Freq: Three times a day (TID) | ORAL | Status: DC
  Administered 2016-02-16 – 2016-02-17 (×4): 50 mg via ORAL
  Filled 2016-02-16 (×4): qty 1

## 2016-02-16 MED ORDER — ONDANSETRON HCL 4 MG/2ML IJ SOLN: 4.0000 mg | Freq: Four times a day (QID) | INTRAMUSCULAR | Status: DC | PRN

## 2016-02-16 MED ORDER — ENOXAPARIN SODIUM 40 MG/0.4ML ~~LOC~~ SOLN
40.0000 mg | SUBCUTANEOUS | Status: DC
  Administered 2016-02-16: 40 mg via SUBCUTANEOUS
  Filled 2016-02-16: qty 0.4

## 2016-02-16 MED ORDER — LEVOFLOXACIN IN D5W 750 MG/150ML IV SOLN
750.0000 mg | INTRAVENOUS | Status: DC
  Filled 2016-02-16: qty 150

## 2016-02-16 MED ORDER — DEXTROSE 5 % IV SOLN
500.0000 mg | INTRAVENOUS | Status: DC
  Administered 2016-02-16: 500 mg via INTRAVENOUS
  Filled 2016-02-16 (×2): qty 500

## 2016-02-16 MED ORDER — DEXTROSE 5 % IV SOLN
1.0000 g | INTRAVENOUS | Status: DC
  Administered 2016-02-16: 1 g via INTRAVENOUS
  Filled 2016-02-16 (×2): qty 10

## 2016-02-16 MED ORDER — DOCUSATE SODIUM 100 MG PO CAPS
100.0000 mg | ORAL_CAPSULE | Freq: Two times a day (BID) | ORAL | Status: DC
  Administered 2016-02-16 – 2016-02-17 (×3): 100 mg via ORAL
  Filled 2016-02-16 (×3): qty 1

## 2016-02-16 MED ORDER — INSULIN ASPART 100 UNIT/ML ~~LOC~~ SOLN: 0.0000 [IU] | Freq: Three times a day (TID) | SUBCUTANEOUS | Status: DC

## 2016-02-16 MED ORDER — POTASSIUM CHLORIDE 20 MEQ PO PACK
40.0000 meq | PACK | ORAL | Status: AC
  Administered 2016-02-16 (×3): 40 meq via ORAL
  Filled 2016-02-16 (×3): qty 2

## 2016-02-16 MED ORDER — ASPIRIN 81 MG PO CHEW
81.0000 mg | CHEWABLE_TABLET | Freq: Every day | ORAL | Status: DC
Start: 2016-02-16 — End: 2016-02-17
  Administered 2016-02-16 – 2016-02-17 (×2): 81 mg via ORAL
  Filled 2016-02-16 (×2): qty 1

## 2016-02-16 MED ORDER — FUROSEMIDE 40 MG PO TABS
40.0000 mg | ORAL_TABLET | ORAL | Status: DC
  Administered 2016-02-16: 40 mg via ORAL
  Filled 2016-02-16: qty 1

## 2016-02-16 MED ORDER — LABETALOL HCL 5 MG/ML IV SOLN: 10.0000 mg | INTRAVENOUS | Status: DC | PRN

## 2016-02-16 MED ORDER — POTASSIUM CHLORIDE CRYS ER 20 MEQ PO TBCR
EXTENDED_RELEASE_TABLET | ORAL | Status: AC
  Filled 2016-02-16: qty 2

## 2016-02-16 MED ORDER — LISINOPRIL 5 MG PO TABS
2.5000 mg | ORAL_TABLET | Freq: Every day | ORAL | Status: DC
  Administered 2016-02-16 – 2016-02-17 (×2): 2.5 mg via ORAL
  Filled 2016-02-16 (×2): qty 1

## 2016-02-16 MED ORDER — ISOSORBIDE MONONITRATE ER 30 MG PO TB24
30.0000 mg | ORAL_TABLET | Freq: Every day | ORAL | Status: DC
  Administered 2016-02-16 – 2016-02-17 (×2): 30 mg via ORAL
  Filled 2016-02-16 (×2): qty 1

## 2016-02-16 MED ORDER — POTASSIUM CHLORIDE CRYS ER 20 MEQ PO TBCR
40.0000 meq | EXTENDED_RELEASE_TABLET | ORAL | Status: AC
  Administered 2016-02-16: 40 meq via ORAL

## 2016-02-16 MED ORDER — MORPHINE SULFATE (PF) 2 MG/ML IV SOLN
2.0000 mg | INTRAVENOUS | Status: DC | PRN
  Administered 2016-02-16 (×3): 2 mg via INTRAVENOUS
  Filled 2016-02-16 (×3): qty 1

## 2016-02-16 MED ORDER — ACETAMINOPHEN 325 MG PO TABS
650.0000 mg | ORAL_TABLET | Freq: Four times a day (QID) | ORAL | Status: DC | PRN
  Administered 2016-02-16: 650 mg via ORAL
  Filled 2016-02-16: qty 2

## 2016-02-16 MED ORDER — ONDANSETRON HCL 4 MG PO TABS: 4.0000 mg | ORAL_TABLET | Freq: Four times a day (QID) | ORAL | Status: DC | PRN

## 2016-02-16 MED ORDER — NITROGLYCERIN 2 % TD OINT
TOPICAL_OINTMENT | TRANSDERMAL | Status: AC
  Administered 2016-02-16: 0.5 [in_us] via TOPICAL
  Filled 2016-02-16: qty 1

## 2016-02-16 MED ORDER — ACETAMINOPHEN 650 MG RE SUPP: 650.0000 mg | Freq: Four times a day (QID) | RECTAL | Status: DC | PRN

## 2016-02-16 MED ORDER — SODIUM CHLORIDE 0.9% FLUSH
3.0000 mL | Freq: Two times a day (BID) | INTRAVENOUS | Status: DC
  Administered 2016-02-16 (×3): 3 mL via INTRAVENOUS

## 2016-02-16 MED ORDER — NITROGLYCERIN 2 % TD OINT
0.5000 [in_us] | TOPICAL_OINTMENT | Freq: Four times a day (QID) | TRANSDERMAL | Status: DC
  Administered 2016-02-16: 0.5 [in_us] via TOPICAL

## 2016-02-16 MED ORDER — AMIODARONE HCL 200 MG PO TABS
200.0000 mg | ORAL_TABLET | Freq: Two times a day (BID) | ORAL | Status: DC
  Administered 2016-02-16 – 2016-02-17 (×3): 200 mg via ORAL
  Filled 2016-02-16 (×3): qty 1

## 2016-02-16 NOTE — ED Notes (Signed)
Called floor to let them know pt on the way up to floor

## 2016-02-16 NOTE — Progress Notes (Signed)
Chest pain relieved with the MSO4 2mg .  Headache persists.  He thinks it's related Nitro paste he was receiving.  Informed him that the drug may have to wear off for his headache to improve.  Nitro has been stopped.

## 2016-02-16 NOTE — Care Management (Signed)
Met with patient at bedside. He is drowsy and would not open his eyes to speak with CM. He did state that he has been to the medication management clinic 1 time before. CM offered him information on [facility] for PCP care and he refused. " I still have the paper work from the last time, I just haven't done it yet". Patient ambulatory, independent, active. No dc needs anticipated.

## 2016-02-16 NOTE — Progress Notes (Signed)
Notified Dr. Marcille Blanco of troponin of 0.06.

## 2016-02-16 NOTE — ED Notes (Signed)
MD Diamond at bedside. 

## 2016-02-16 NOTE — Progress Notes (Signed)
Pharmacy Antibiotic Note  Gabriel [name]. is a 45 y.o. male admitted on 02/15/2016 with pneumonia.  Pharmacy has been consulted for Levaquin dosing.  Plan: Levofloxacin 750 mg IV q 24 hours ordered.    Height: 6\' 1"  (185.4 cm) Weight: 243 lb 4.8 oz (110.36 kg) IBW/kg (Calculated) : 79.9  Temp (24hrs), Avg:98.3 F (36.8 C), Min:98.2 F (36.8 C), Max:98.4 F (36.9 C)   Recent Labs Lab 02/15/16 2214  WBC 10.2  CREATININE 1.14    Estimated Creatinine Clearance: 104.4 mL/min (by C-G formula based on Cr of 1.14).    Allergies  Allergen Reactions  . Prednisone Other (See Comments)    Reaction: Hallucinations     Antimicrobials this admission: Levaquin  >>    >>   Dose adjustments this admission:   Microbiology results:   6/29 CXR: RUL opacity, resolving.  Thank you for allowing pharmacy to be a part of this patient's care.  Gabriel [name] S 02/16/2016 3:33 AM

## 2016-02-16 NOTE — Progress Notes (Signed)
[facility] NAME: Gabriel [name]    MR#:  [id number]  DATE OF BIRTH:  04-22-1971  SUBJECTIVE: Admitted for pain on left side. Also has shortness of breath.   CHIEF COMPLAINT:   Chief Complaint  Patient presents with  . Chest Pain    Pt. states sudden onset of lt. sided chest pain that started about an hour ago.    REVIEW OF SYSTEMS:   ROS CONSTITUTIONAL: No fever, fatigue or weakness.  EYES: No blurred or double vision.  EARS, NOSE, AND THROAT: No tinnitus or ear pain.  RESPIRATORY: Cough, shortness of breath, left-sided chest pain. CARDIOVASCULAR: No chest pain, orthopnea, edema.  GASTROINTESTINAL: No nausea, vomiting, diarrhea or abdominal pain.  GENITOURINARY: No dysuria, hematuria.  ENDOCRINE: No polyuria, nocturia,  HEMATOLOGY: No anemia, easy bruising or bleeding SKIN: No rash or lesion. MUSCULOSKELETAL: No joint pain or arthritis.   NEUROLOGIC: No tingling, numbness, weakness.  PSYCHIATRY: No anxiety or depression.   DRUG ALLERGIES:   Allergies  Allergen Reactions  . Prednisone Other (See Comments)    Reaction: Hallucinations     VITALS:  Blood pressure 141/95, pulse 72, temperature 97.6 F (36.4 C), temperature source Oral, resp. rate 19, height 6\' 1"  (1.854 m), weight 110.406 kg (243 lb 6.4 oz), SpO2 97 %.  PHYSICAL EXAMINATION:  GENERAL:  45 y.o.-year-old patient lying in the bed with no acute distress.  EYES: Pupils equal, round, reactive to light and accommodation. No scleral icterus. Extraocular muscles intact.  HEENT: Head atraumatic, normocephalic. Oropharynx and nasopharynx clear.  NECK:  Supple, no jugular venous distention. No thyroid enlargement, no tenderness.  LUNGS: Normal breath sounds bilaterally, no wheezing, rales,rhonchi or crepitation. No use of accessory muscles of respiration.  CARDIOVASCULAR: S1, S2 normal. No murmurs, rubs, or gallops.  ABDOMEN: Soft, nontender, nondistended.  Bowel sounds present. No organomegaly or mass.  EXTREMITIES: No pedal edema, cyanosis, or clubbing.  NEUROLOGIC: Cranial nerves II through XII are intact. Muscle strength 5/5 in all extremities. Sensation intact. Gait not checked.  PSYCHIATRIC: The patient is alert and oriented x 3. SKIN: No obvious rash, lesion, or ulcer.    LABORATORY PANEL:   CBC  Recent Labs Lab 02/15/16 2214  WBC 10.2  HGB 13.5  HCT 39.2*  PLT 175   ------------------------------------------------------------------------------------------------------------------  Chemistries   Recent Labs Lab 02/15/16 2214  NA 138  K 3.0*  CL 106  CO2 22  GLUCOSE 140*  BUN 24*  CREATININE 1.14  CALCIUM 8.8*  AST 23  ALT 21  ALKPHOS 83  BILITOT 0.7   ------------------------------------------------------------------------------------------------------------------  Cardiac Enzymes  Recent Labs Lab 02/16/16 0829  TROPONINI 0.06*   ------------------------------------------------------------------------------------------------------------------  RADIOLOGY:  Dg Chest 2 View  02/15/2016  CLINICAL DATA:  Sudden onset of left-sided chest pain and dyspnea. EXAM: CHEST  2 VIEW COMPARISON:  01/22/2016 FINDINGS: Persistent right upper lobe airspace opacity, unchanged from 01/22/2016 although it has improved from 01/04/2016. Left lung is clear. No pleural effusions. Normal pulmonary vasculature. Unremarkable hilar and mediastinal contours. IMPRESSION: Persistent right upper lobe airspace opacity consistent with pneumonia, improved but not completely resolved. No new abnormalities. Continued follow-up radiography is recommended to confirm complete clearance of the right upper lobe abnormality. Electronically Signed   By: Andreas Newport M.D.   On: 02/15/2016 22:58   Ct Angio Chest Pe W/cm &/or Wo Cm  02/15/2016  CLINICAL DATA:  45 year old male With sudden onset left-sided chest pain and shortness of breath EXAM: CT  ANGIOGRAPHY CHEST WITH CONTRAST TECHNIQUE: Multidetector CT imaging of the chest was performed using the standard protocol during bolus administration of intravenous contrast. Multiplanar CT image reconstructions and MIPs were obtained to evaluate the vascular anatomy. CONTRAST:  100 cc Isovue 370 COMPARISON:  Chest radiograph dated 02/15/2016 FINDINGS: There is a focal area of pleural based consolidative change with air bronchograms in the posterior segment of the right upper lobe most likely pneumonia. Underlying mass is less likely given sudden onset a rapid progression but not excluded. Follow-up to resolution is recommended. There is diffuse interstitial prominence and edema. There is diffuse mosaic appearance of the lungs with areas of air trapping likely related to underlying small vessel versus small airway disease. Small right pleural effusion. There is no pneumothorax. The central airways are patent. The thoracic aorta appears unremarkable. No CT evidence of pulmonary embolism. Top-normal cardiac size. No pericardial effusion. There is coronary vascular calcification. Right hilar adenopathy. Multiple top-normal lymph nodes in the prevascular space. Right paratracheal adenopathy measures 14 mm in short axis. The esophagus is grossly unremarkable. No thyroid nodules identified. There is no axillary adenopathy. The chest wall soft tissues appear unremarkable. There is degenerative changes of the spine. No acute fracture. A 1 cm sclerotic lesion in the right third rib likely represents a bone island. The visualized upper abdomen appear unremarkable. Review of the MIP images confirms the above findings. IMPRESSION: No CT evidence of pulmonary embolism. Focal consolidative changes of the right upper lobe most likely pneumonia. Clinical correlation and follow-up to resolution recommended. Mild interstitial edema and small right pleural effusion. Right hilar and mediastinal adenopathy as seen on the prior study.  Clinical correlation is recommended. Electronically Signed   By: Anner Crete M.D.   On: 02/15/2016 23:59    EKG:   Orders placed or performed during the hospital encounter of 02/15/16  . EKG 12-Lead  . EKG 12-Lead  . ED EKG within 10 minutes  . ED EKG within 10 minutes  . EKG 12-Lead  . EKG 12-Lead  . ED EKG  . ED EKG    ASSESSMENT AND PLAN:  #1 community-acquired pneumonia without sepsis: Continue Rocephin, Zithromax likely discharge home tomorrow with Augmentin and Zithromax.  #2 pain on the left side secondary to pneumonia. Negative troponins. Patient has a history of nonischemic cardiomyopathy, has seen by cardiology from [facility]. And had echocardiogram done on the last month. EF 25%. #3 .chronic systolic heart failure; patient not started on Ace inhibitors during the last admission because of renal failure. Continue Imdur, Coreg, hydralazine , Lasix.Marland Kitchenas  Renal function improved, started on lisinopril 2.5 mg daily at this time admission.  #3 history of COPD with ongoing tobacco abuse 2 packs per day; home medications. #4. diabetes mellitus type 2 #5. nonsustained V. tach. We'll continue amiodarone.. #6.hypokalemia ;replace the potassium. 7 possible narcotic seeking behavior: Advised the patient that he'll be going home with Augmentin, Zithromax tomorrow, watch  telemetry for further arrhythmias today.  All the records are reviewed and case discussed with Care Management/Social Workerr. Management plans discussed with the patient, family and they are in agreement.  CODE STATUS: full TOTAL TIME TAKING CARE OF THIS PATIENT: 52minutes.   POSSIBLE D/C IN 1-2DAYS, DEPENDING ON CLINICAL CONDITION.   Epifanio Lesches M.D on 02/16/2016 at 1:24 PM  Between 7am to 6pm - Pager - [phone]  After 6pm go to [email or web address] - password [email or web address] [facility]  [facility]  Office  [phone]  CC: Primary care physician; No PCP Per  Patient   Note: This dictation was  prepared with Dragon dictation along with smaller phrase technology. Any transcriptional errors that result from this process are unintentional.

## 2016-02-16 NOTE — Progress Notes (Signed)
A&O. Up with standby assist. Admitted from home with chest pain and pneumonia. IV antibiotics given. Complained of chest pain but states it is better. On O2 at 2L, acute.

## 2016-02-16 NOTE — H&P (Addendum)
Gabriel [name]. is an 45 y.o. male.   Chief Complaint: Chest pain HPI: The patient with past medical history of congestive heart failure and hypertension presents to the emergency department complaining of chest pain that began at rest. He states that it has waxed and waned over 5 hours. He admits that it is associated with shortness of breath and lightheadedness. He denies nausea, vomiting or diaphoresis. In the emergency department the patient underwent a CT angiogram of the chest rule out pulmonary embolism. He was found to have a right upper lobe pneumonia. Oxygen saturations are consistently above 94% on 1 L of oxygen via nasal cannula. In the emergency room the patient received multiple doses of narcotics that did not relieve his chest pain. Troponin was found to be minimally elevated. He was started on IV antibiotics prior to the emergency department staff calling the hospitalist service for admission.  Past Medical History  Diagnosis Date  . Hypertension     Resolved since weight loss  . Obesity (BMI 30.0-34.9)   . Psoriasis   . Diabetes mellitus without complication ([place])     Pre diabetic  . CHF (congestive heart failure) ([place])   . COPD (chronic obstructive pulmonary disease) [facility])     Past Surgical History  Procedure Laterality Date  . Amputation    . Finger amputation      Traumatic    Family History  Problem Relation Age of Onset  . Diabetes Mellitus II Mother   . Hypertension Father   . Cancer Paternal Aunt   . Cancer Maternal Grandfather    Social History:  reports that he has been smoking Cigarettes.  He has been smoking about 0.00 packs per day for the past 33 years. He has never used smokeless tobacco. He reports that he drinks about 1.8 oz of alcohol per week. He reports that he does not use illicit drugs.  Allergies:  Allergies  Allergen Reactions  . Prednisone Other (See Comments)    Reaction: Hallucinations     Prior to Admission medications    Medication Sig Start Date End Date Taking? Authorizing Provider  amiodarone (PACERONE) 200 MG tablet Take 1 tablet (200 mg total) by mouth 2 (two) times daily. 02/08/16  Yes Minna Merritts, MD  aspirin 81 MG chewable tablet Chew 81 mg by mouth daily. Reported on 02/08/2016   Yes Historical Provider, MD  furosemide (LASIX) 40 MG tablet Take 1 tablet (40 mg total) by mouth every other day. 02/08/16 02/07/17 Yes Minna Merritts, MD  hydrALAZINE (APRESOLINE) 50 MG tablet Take 1 tablet (50 mg total) by mouth every 8 (eight) hours. 02/08/16  Yes Minna Merritts, MD     Results for orders placed or performed during the hospital encounter of 02/15/16 (from the past 48 hour(s))  CBC     Status: Abnormal   Collection Time: 02/15/16 10:14 PM  Result Value Ref Range   WBC 10.2 3.8 - 10.6 K/uL   RBC 4.85 4.40 - 5.90 MIL/uL   Hemoglobin 13.5 13.0 - 18.0 g/dL   HCT 39.2 (L) 40.0 - 52.0 %   MCV 80.9 80.0 - 100.0 fL   MCH 27.8 26.0 - 34.0 pg   MCHC 34.4 32.0 - 36.0 g/dL   RDW 16.1 (H) 11.5 - 14.5 %   Platelets 175 150 - 440 K/uL  Comprehensive metabolic panel     Status: Abnormal   Collection Time: 02/15/16 10:14 PM  Result Value Ref Range   Sodium 138  135 - 145 mmol/L   Potassium 3.0 (L) 3.5 - 5.1 mmol/L   Chloride 106 101 - 111 mmol/L   CO2 22 22 - 32 mmol/L   Glucose, Bld 140 (H) 65 - 99 mg/dL   BUN 24 (H) 6 - 20 mg/dL   Creatinine, Ser 1.14 0.61 - 1.24 mg/dL   Calcium 8.8 (L) 8.9 - 10.3 mg/dL   Total Protein 7.5 6.5 - 8.1 g/dL   Albumin 3.7 3.5 - 5.0 g/dL   AST 23 15 - 41 U/L   ALT 21 17 - 63 U/L   Alkaline Phosphatase 83 38 - 126 U/L   Total Bilirubin 0.7 0.3 - 1.2 mg/dL   GFR calc non Af Amer >60 >60 mL/min   GFR calc Af Amer >60 >60 mL/min    Comment: (NOTE) The eGFR has been calculated using the CKD EPI equation. This calculation has not been validated in all clinical situations. eGFR's persistently <60 mL/min signify possible Chronic Kidney Disease.    Anion gap 10 5 - 15   Troponin I     Status: Abnormal   Collection Time: 02/15/16 10:14 PM  Result Value Ref Range   Troponin I 0.05 (HH) <0.03 ng/mL    Comment: CRITICAL RESULT CALLED TO, READ BACK BY AND VERIFIED WITH: JENNA ELLINGTON ON 02/15/16 AT 2256 BY SDR/TLB    Dg Chest 2 View  02/15/2016  CLINICAL DATA:  Sudden onset of left-sided chest pain and dyspnea. EXAM: CHEST  2 VIEW COMPARISON:  01/22/2016 FINDINGS: Persistent right upper lobe airspace opacity, unchanged from 01/22/2016 although it has improved from 01/04/2016. Left lung is clear. No pleural effusions. Normal pulmonary vasculature. Unremarkable hilar and mediastinal contours. IMPRESSION: Persistent right upper lobe airspace opacity consistent with pneumonia, improved but not completely resolved. No new abnormalities. Continued follow-up radiography is recommended to confirm complete clearance of the right upper lobe abnormality. Electronically Signed   By: Andreas Newport M.D.   On: 02/15/2016 22:58   Ct Angio Chest Pe W/cm &/or Wo Cm  02/15/2016  CLINICAL DATA:  45 year old male With sudden onset left-sided chest pain and shortness of breath EXAM: CT ANGIOGRAPHY CHEST WITH CONTRAST TECHNIQUE: Multidetector CT imaging of the chest was performed using the standard protocol during bolus administration of intravenous contrast. Multiplanar CT image reconstructions and MIPs were obtained to evaluate the vascular anatomy. CONTRAST:  100 cc Isovue 370 COMPARISON:  Chest radiograph dated 02/15/2016 FINDINGS: There is a focal area of pleural based consolidative change with air bronchograms in the posterior segment of the right upper lobe most likely pneumonia. Underlying mass is less likely given sudden onset a rapid progression but not excluded. Follow-up to resolution is recommended. There is diffuse interstitial prominence and edema. There is diffuse mosaic appearance of the lungs with areas of air trapping likely related to underlying small vessel versus small  airway disease. Small right pleural effusion. There is no pneumothorax. The central airways are patent. The thoracic aorta appears unremarkable. No CT evidence of pulmonary embolism. Top-normal cardiac size. No pericardial effusion. There is coronary vascular calcification. Right hilar adenopathy. Multiple top-normal lymph nodes in the prevascular space. Right paratracheal adenopathy measures 14 mm in short axis. The esophagus is grossly unremarkable. No thyroid nodules identified. There is no axillary adenopathy. The chest wall soft tissues appear unremarkable. There is degenerative changes of the spine. No acute fracture. A 1 cm sclerotic lesion in the right third rib likely represents a bone island. The visualized upper abdomen appear unremarkable. Review  of the MIP images confirms the above findings. IMPRESSION: No CT evidence of pulmonary embolism. Focal consolidative changes of the right upper lobe most likely pneumonia. Clinical correlation and follow-up to resolution recommended. Mild interstitial edema and small right pleural effusion. Right hilar and mediastinal adenopathy as seen on the prior study. Clinical correlation is recommended. Electronically Signed   By: Anner Crete M.D.   On: 02/15/2016 23:59    Review of Systems  Constitutional: Negative for fever and chills.  HENT: Negative for sore throat and tinnitus.   Eyes: Negative for blurred vision and redness.  Respiratory: Positive for shortness of breath. Negative for cough.   Cardiovascular: Positive for chest pain. Negative for palpitations, orthopnea and PND.  Gastrointestinal: Negative for nausea, vomiting, abdominal pain and diarrhea.  Genitourinary: Negative for dysuria, urgency and frequency.  Musculoskeletal: Negative for myalgias and joint pain.  Skin: Negative for rash.       No lesions  Neurological: Negative for speech change, focal weakness and weakness.  Endo/Heme/Allergies: Does not bruise/bleed easily.       No  temperature intolerance  Psychiatric/Behavioral: Negative for depression and suicidal ideas.    Blood pressure 176/109, pulse 90, temperature 98.2 F (36.8 C), resp. rate 19, height _0  (1.854 m), weight 107.502 kg (237 lb), SpO2 97 %. Physical Exam  Constitutional: He is oriented to person, place, and time. He appears well-developed and well-nourished. No distress.  HENT:  Head: Normocephalic and atraumatic.  Mouth/Throat: Oropharynx is clear and moist.  Eyes: Conjunctivae are normal. Pupils are equal, round, and reactive to light. No scleral icterus.  Neck: Normal range of motion. Neck supple. No JVD present. No tracheal deviation present. No thyromegaly present.  Cardiovascular: Normal rate, regular rhythm and normal heart sounds.  Exam reveals no gallop and no friction rub.   No murmur heard. Respiratory: Effort normal and breath sounds normal. No respiratory distress.  GI: Soft. Bowel sounds are normal. He exhibits no distension. There is no tenderness.  Genitourinary:  Deferred  Musculoskeletal: Normal range of motion.  Lymphadenopathy:    He has no cervical adenopathy.  Neurological: He is alert and oriented to person, place, and time. No cranial nerve deficit.  Skin: Skin is warm and dry. No rash noted. No erythema.  Psychiatric: He has a normal mood and affect. His behavior is normal. Judgment and thought content normal.     Assessment/Plan This is a 45 year old male admitted for pneumonia and noncardiac chest pain. 1. Pneumonia: Community-acquired; continue Levaquin. The patient does not have any signs or symptoms of sepsis. 2. Chest pain: Atypical; likely noncardiac in origin. Continue to follow cardiac biomarkers. I have placed nitroglycerin patient's chest to control blood pressure and relieve venous congestion. Restart Imdur in the morning if the patient can tolerate. 3. Essential hypertension: Uncontrolled. Continue Lasix and hydralazine. Labetalol as needed for SBP  greater then 947. 4. Systolic heart failure: Chronic; continue furosemide 5. History of ventricular tachycardia: Continue amiodarone. Patient is currently in normal sinus rhythm. 6. Metabolic syndrome: Check hemoglobin A1c. I have placed the4 patient on sliding scale insulin while hospitalized. 7. DVT prophylaxis: Heparin 8. GI prophylaxis: None The patient is a full code. Time spent on admission orders and patient care approximately 45 minutes  Harrie Foreman, MD 02/16/2016, 1:08 AM

## 2016-02-17 LAB — BASIC METABOLIC PANEL
Anion gap: 4 — ABNORMAL LOW (ref 5–15)
BUN: 22 mg/dL — ABNORMAL HIGH (ref 6–20)
CO2: 27 mmol/L (ref 22–32)
Calcium: 8.7 mg/dL — ABNORMAL LOW (ref 8.9–10.3)
Chloride: 106 mmol/L (ref 101–111)
Creatinine, Ser: 1.05 mg/dL (ref 0.61–1.24)
GFR calc Af Amer: >60 mL/min (ref >60)
GFR calc non Af Amer: >60 mL/min (ref >60)
Glucose, Bld: 99 mg/dL (ref 65–99)
Potassium: 4.3 mmol/L (ref 3.5–5.1)
Sodium: 137 mmol/L (ref 135–145)

## 2016-02-17 LAB — GLUCOSE, CAPILLARY: Glucose-Capillary: 96 mg/dL (ref 65–99)

## 2016-02-17 MED ORDER — LISINOPRIL 2.5 MG PO TABS: 2.5000 mg | ORAL_TABLET | Freq: Every day | ORAL | Status: DC

## 2016-02-17 MED ORDER — AMOXICILLIN-POT CLAVULANATE 875-125 MG PO TABS: 1.0000 | ORAL_TABLET | Freq: Two times a day (BID) | ORAL | Status: DC

## 2016-02-17 MED ORDER — ZITHROMAX 500 MG PO TABS: 500.0000 mg | ORAL_TABLET | Freq: Every day | ORAL | Status: DC

## 2016-02-17 NOTE — Progress Notes (Signed)
A&O. Up with minimal assist. Meds given for pain during the night. IV antibiotics given for pneumonia.

## 2016-02-17 NOTE — Care Management Note (Signed)
Case Management Note  Patient Details  Name: Brevin Durkee. MRN: [id number] Date of Birth: 08-Aug-1971  Subjective/Objective:       Provided Mr Klass with a MATCH coupon and explained to him how to use it. Mr Lhuillier verbalized instructions back to this Probation officer. Provided Mr Hunt with a list of local clinics what provide medical and medication services to uninsured individuals. Encouraged Mr Hilling to contact on of these clinics to obtain a PCP.              Action/Plan:   Expected Discharge Date:                  Expected Discharge Plan:     In-House Referral:     Discharge planning Services     Post Acute Care Choice:    Choice offered to:     DME Arranged:    DME Agency:     HH Arranged:    HH Agency:     Status of Service:     If discussed at [employer] of Stay Meetings, dates discussed:    Additional Comments:  Jes Costales A, RN 02/17/2016, 9:17 AM

## 2016-02-17 NOTE — Progress Notes (Signed)
[facility] NAME: Gabriel [name]    MR#:  [id number]  DATE OF BIRTH:  12/13/1970  SUBJECTIVE: denies any complains.stable for discharge  CHIEF COMPLAINT:   Chief Complaint  Patient presents with  . Chest Pain    Pt. states sudden onset of lt. sided chest pain that started about an hour ago.    REVIEW OF SYSTEMS:   Review of Systems  Constitutional: Negative for fever and chills.  HENT: Negative for hearing loss.   Eyes: Negative for blurred vision, double vision and photophobia.  Respiratory: Negative for cough, hemoptysis and shortness of breath.   Cardiovascular: Negative for palpitations, orthopnea and leg swelling.  Gastrointestinal: Negative for vomiting, abdominal pain and diarrhea.  Genitourinary: Negative for dysuria and urgency.  Musculoskeletal: Negative for myalgias and neck pain.  Skin: Negative for rash.  Neurological: Negative for dizziness, focal weakness, seizures, weakness and headaches.  Psychiatric/Behavioral: Negative for memory loss. The patient does not have insomnia.    CONSTITUTIONAL: No fever, fatigue or weakness.  EYES: No blurred or double vision.  EARS, NOSE, AND THROAT: No tinnitus or ear pain.  RESPIRATORY: Cough, shortness of breath, left-sided chest pain. CARDIOVASCULAR: No chest pain, orthopnea, edema.  GASTROINTESTINAL: No nausea, vomiting, diarrhea or abdominal pain.  GENITOURINARY: No dysuria, hematuria.  ENDOCRINE: No polyuria, nocturia,  HEMATOLOGY: No anemia, easy bruising or bleeding SKIN: No rash or lesion. MUSCULOSKELETAL: No joint pain or arthritis.   NEUROLOGIC: No tingling, numbness, weakness.  PSYCHIATRY: No anxiety or depression.   DRUG ALLERGIES:   Allergies  Allergen Reactions  . Prednisone Other (See Comments)    Reaction: Hallucinations     VITALS:  Blood pressure 141/99, pulse 81, temperature 98.2 F (36.8 C), temperature source Oral, resp. rate 18,  height 6\' 1"  (1.854 m), weight 109.76 kg (241 lb 15.6 oz), SpO2 96 %.  PHYSICAL EXAMINATION:  GENERAL:  45 y.o.-year-old patient lying in the bed with no acute distress.  EYES: Pupils equal, round, reactive to light and accommodation. No scleral icterus. Extraocular muscles intact.  HEENT: Head atraumatic, normocephalic. Oropharynx and nasopharynx clear.  NECK:  Supple, no jugular venous distention. No thyroid enlargement, no tenderness.  LUNGS: Normal breath sounds bilaterally, no wheezing, rales,rhonchi or crepitation. No use of accessory muscles of respiration.  CARDIOVASCULAR: S1, S2 normal. No murmurs, rubs, or gallops.  ABDOMEN: Soft, nontender, nondistended. Bowel sounds present. No organomegaly or mass.  EXTREMITIES: No pedal edema, cyanosis, or clubbing.  NEUROLOGIC: Cranial nerves II through XII are intact. Muscle strength 5/5 in all extremities. Sensation intact. Gait not checked.  PSYCHIATRIC: The patient is alert and oriented x 3. SKIN: No obvious rash, lesion, or ulcer.    LABORATORY PANEL:   CBC  Recent Labs Lab 02/15/16 2214  WBC 10.2  HGB 13.5  HCT 39.2*  PLT 175   ------------------------------------------------------------------------------------------------------------------  Chemistries   Recent Labs Lab 02/15/16 2214 02/16/16 1412 02/17/16 0518  NA 138  --  137  K 3.0*  --  4.3  CL 106  --  106  CO2 22  --  27  GLUCOSE 140*  --  99  BUN 24*  --  22*  CREATININE 1.14  --  1.05  CALCIUM 8.8*  --  8.7*  MG  --  1.8  --   AST 23  --   --   ALT 21  --   --   ALKPHOS 83  --   --  BILITOT 0.7  --   --    ------------------------------------------------------------------------------------------------------------------  Cardiac Enzymes  Recent Labs Lab 02/16/16 1412  TROPONINI 0.04*   ------------------------------------------------------------------------------------------------------------------  RADIOLOGY:  Dg Chest 2 View  02/15/2016   CLINICAL DATA:  Sudden onset of left-sided chest pain and dyspnea. EXAM: CHEST  2 VIEW COMPARISON:  01/22/2016 FINDINGS: Persistent right upper lobe airspace opacity, unchanged from 01/22/2016 although it has improved from 01/04/2016. Left lung is clear. No pleural effusions. Normal pulmonary vasculature. Unremarkable hilar and mediastinal contours. IMPRESSION: Persistent right upper lobe airspace opacity consistent with pneumonia, improved but not completely resolved. No new abnormalities. Continued follow-up radiography is recommended to confirm complete clearance of the right upper lobe abnormality. Electronically Signed   By: Andreas Newport M.D.   On: 02/15/2016 22:58   Ct Angio Chest Pe W/cm &/or Wo Cm  02/15/2016  CLINICAL DATA:  45 year old male With sudden onset left-sided chest pain and shortness of breath EXAM: CT ANGIOGRAPHY CHEST WITH CONTRAST TECHNIQUE: Multidetector CT imaging of the chest was performed using the standard protocol during bolus administration of intravenous contrast. Multiplanar CT image reconstructions and MIPs were obtained to evaluate the vascular anatomy. CONTRAST:  100 cc Isovue 370 COMPARISON:  Chest radiograph dated 02/15/2016 FINDINGS: There is a focal area of pleural based consolidative change with air bronchograms in the posterior segment of the right upper lobe most likely pneumonia. Underlying mass is less likely given sudden onset a rapid progression but not excluded. Follow-up to resolution is recommended. There is diffuse interstitial prominence and edema. There is diffuse mosaic appearance of the lungs with areas of air trapping likely related to underlying small vessel versus small airway disease. Small right pleural effusion. There is no pneumothorax. The central airways are patent. The thoracic aorta appears unremarkable. No CT evidence of pulmonary embolism. Top-normal cardiac size. No pericardial effusion. There is coronary vascular calcification. Right hilar  adenopathy. Multiple top-normal lymph nodes in the prevascular space. Right paratracheal adenopathy measures 14 mm in short axis. The esophagus is grossly unremarkable. No thyroid nodules identified. There is no axillary adenopathy. The chest wall soft tissues appear unremarkable. There is degenerative changes of the spine. No acute fracture. A 1 cm sclerotic lesion in the right third rib likely represents a bone island. The visualized upper abdomen appear unremarkable. Review of the MIP images confirms the above findings. IMPRESSION: No CT evidence of pulmonary embolism. Focal consolidative changes of the right upper lobe most likely pneumonia. Clinical correlation and follow-up to resolution recommended. Mild interstitial edema and small right pleural effusion. Right hilar and mediastinal adenopathy as seen on the prior study. Clinical correlation is recommended. Electronically Signed   By: Anner Crete M.D.   On: 02/15/2016 23:59    EKG:   Orders placed or performed during the hospital encounter of 02/15/16  . EKG 12-Lead  . EKG 12-Lead  . ED EKG within 10 minutes  . ED EKG within 10 minutes  . EKG 12-Lead  . EKG 12-Lead  . ED EKG  . ED EKG    ASSESSMENT AND PLAN:  #1 community-acquired pneumonia without sepsis:;d/c home today with Augmentin and azithromycin  #2 pain on the left side secondary to pneumonia. Negative troponins. Patient has a history of nonischemic cardiomyopathy, has seen by cardiology from [facility]. And had echocardiogram done on the last month. EF 25%. #3 .chronic systolic heart failure; patient not started on Ace inhibitors during the last admission because of renal failure. Continue Imdur, Coreg, hydralazine , Lasix.Marland Kitchenas  Renal function improved, started on lisinopril 2.5 mg daily at this time admission.   #3 history of COPD with ongoing tobacco abuse 2 packs per day; home medications. #4. diabetes mellitus type 2 #5. nonsustained V. tach. We'll continue  amiodarone.. #6.hypokalemia ;replaced the potassium.normal K today. 7 dermatitis/c home today. All the records are reviewed and case discussed with Care Management/Social Workerr. Management plans discussed with the patient, family and they are in agreement.  CODE STATUS: full TOTAL TIME TAKING CARE OF THIS PATIENT: 29minutes.   POSSIBLE D/C IN 1-2DAYS, DEPENDING ON CLINICAL CONDITION.   Epifanio Lesches M.D on 02/17/2016 at 8:44 AM  Between 7am to 6pm - Pager - [phone]  After 6pm go to [email or web address] - password [email or web address] [facility]  [facility]  Office  [phone]  CC: Primary care physician; No PCP Per Patient   Note: This dictation was prepared with Dragon dictation along with smaller phrase technology. Any transcriptional errors that result from this process are unintentional.

## 2016-02-18 NOTE — Discharge Summary (Signed)
Gabriel [name]., is a 45 y.o. male  DOB [birth year]  MRN [id number].  Admission date:  02/15/2016  Admitting Physician  Harrie Foreman, MD  Discharge Date:  02/17/2016   Primary MD  No PCP Per Patient  Recommendations for primary care physician for things to follow:   Patient can follow up with [name] in 1 week Admission Diagnosis  Chest Pains   Discharge Diagnosis  Chest Pains   Active Problems:   Chest pain      Past Medical History  Diagnosis Date  . Hypertension     Resolved since weight loss  . Obesity (BMI 30.0-34.9)   . Psoriasis   . Diabetes mellitus without complication ([place])     Pre diabetic  . CHF (congestive heart failure) ([place])   . COPD (chronic obstructive pulmonary disease) [facility])     Past Surgical History  Procedure Laterality Date  . Amputation    . Finger amputation      Traumatic       History of present illness and  Hospital Course:     Kindly see H&P for history of present illness and admission details, please review complete Labs, Consult reports and Test reports for all details in brief  HPI  from the history and physical done on the day of admission  45 year old male patient admitted because of chest pain patient also shortness of breath, lightheadedness. History of nonischemic cardiomyopathy, because of that admitted for chest pain evaluation, admitted to telemetry.  Hospital Course  #1 chest pain secondary to pneumonia: Troponins are negative. Started on antibiotics. Discharged home with  augmentin, azithromycin.  X-ray showed right-sided pneumonia. WBC normal. Patient did have the same problem in month of May. TMJ chest is done this time showed focal consolidation in the right upper lobe due to pneumonia, patient did not have any PE. And also negative for lung mass. 2.  Chronic systolic heart failure: Patient started on lisinopril2.5mg  during this hospitalization,advised t ocontinueCoreg,Imdur,hydralazine,Lasix,patient was  Followed byDr.Gollan :As an outpatient. #3.historyofCOPD; with ongoing tobacco abuse:Patient advised to quit,no wheezing.This hospitalization. #4.diabetesmellitustype2: #5,nonsustainedV. Tach:Patient  Is onamiodarone at home ,so we continued that.    Discharge Condition: stable   Follow UP  Follow-up Information    Follow up with [name], MD In 1 week.   Specialty:  Cardiology   Contact information:   [place] [address] [phone]         Discharge Instructions  and  Discharge Medications        Medication List    TAKE these medications        amiodarone 200 MG tablet  Commonly known as:  PACERONE  Take 1 tablet (200 mg total) by mouth 2 (two) times daily.     amoxicillin-clavulanate 875-125 MG tablet  Commonly known as:  AUGMENTIN  Take 1 tablet by mouth 2 (two) times daily.     aspirin 81 MG chewable tablet  Chew 81 mg by mouth daily. Reported on 02/08/2016     furosemide 40 MG tablet  Commonly known as:  LASIX  Take 1 tablet (40 mg total) by mouth every other day.     hydrALAZINE 50 MG tablet  Commonly known as:  APRESOLINE  Take 1 tablet (50 mg total) by mouth every 8 (eight) hours.     lisinopril 2.5 MG tablet  Commonly known as:  PRINIVIL,ZESTRIL  Take 1 tablet (2.5 mg total) by mouth daily.  ZITHROMAX 500 MG tablet  Generic drug:  azithromycin  Take 1 tablet (500 mg total) by mouth daily.          Diet and Activity recommendation: See Discharge Instructions above   Consults obtained - none   Major procedures and Radiology Reports - PLEASE review detailed and final reports for all details, in brief -      Dg Chest 2 View  02/15/2016  CLINICAL DATA:  Sudden onset of left-sided chest pain and dyspnea. EXAM: CHEST  2 VIEW COMPARISON:   01/22/2016 FINDINGS: Persistent right upper lobe airspace opacity, unchanged from 01/22/2016 although it has improved from 01/04/2016. Left lung is clear. No pleural effusions. Normal pulmonary vasculature. Unremarkable hilar and mediastinal contours. IMPRESSION: Persistent right upper lobe airspace opacity consistent with pneumonia, improved but not completely resolved. No new abnormalities. Continued follow-up radiography is recommended to confirm complete clearance of the right upper lobe abnormality. Electronically Signed   By: Andreas Newport M.D.   On: 02/15/2016 22:58   Ct Angio Chest Pe W/cm &/or Wo Cm  02/15/2016  CLINICAL DATA:  45 year old male With sudden onset left-sided chest pain and shortness of breath EXAM: CT ANGIOGRAPHY CHEST WITH CONTRAST TECHNIQUE: Multidetector CT imaging of the chest was performed using the standard protocol during bolus administration of intravenous contrast. Multiplanar CT image reconstructions and MIPs were obtained to evaluate the vascular anatomy. CONTRAST:  100 cc Isovue 370 COMPARISON:  Chest radiograph dated 02/15/2016 FINDINGS: There is a focal area of pleural based consolidative change with air bronchograms in the posterior segment of the right upper lobe most likely pneumonia. Underlying mass is less likely given sudden onset a rapid progression but not excluded. Follow-up to resolution is recommended. There is diffuse interstitial prominence and edema. There is diffuse mosaic appearance of the lungs with areas of air trapping likely related to underlying small vessel versus small airway disease. Small right pleural effusion. There is no pneumothorax. The central airways are patent. The thoracic aorta appears unremarkable. No CT evidence of pulmonary embolism. Top-normal cardiac size. No pericardial effusion. There is coronary vascular calcification. Right hilar adenopathy. Multiple top-normal lymph nodes in the prevascular space. Right paratracheal adenopathy  measures 14 mm in short axis. The esophagus is grossly unremarkable. No thyroid nodules identified. There is no axillary adenopathy. The chest wall soft tissues appear unremarkable. There is degenerative changes of the spine. No acute fracture. A 1 cm sclerotic lesion in the right third rib likely represents a bone island. The visualized upper abdomen appear unremarkable. Review of the MIP images confirms the above findings. IMPRESSION: No CT evidence of pulmonary embolism. Focal consolidative changes of the right upper lobe most likely pneumonia. Clinical correlation and follow-up to resolution recommended. Mild interstitial edema and small right pleural effusion. Right hilar and mediastinal adenopathy as seen on the prior study. Clinical correlation is recommended. Electronically Signed   By: Anner Crete M.D.   On: 02/15/2016 23:59   US Carotid Bilateral  01/23/2016  CLINICAL DATA:  Syncope EXAM: BILATERAL CAROTID DUPLEX ULTRASOUND TECHNIQUE: Pearline Cables scale imaging, color Doppler and duplex ultrasound was performed of bilateral carotid and vertebral arteries in the neck. COMPARISON:  None. TECHNIQUE: Quantification of carotid stenosis is based on velocity parameters that correlate the residual internal carotid diameter with NASCET-based stenosis levels, using the diameter of the distal internal carotid lumen as the denominator for stenosis measurement. The following velocity measurements were obtained: PEAK SYSTOLIC/END DIASTOLIC RIGHT ICA:  59/20cm/sec CCA:                     [id number] SYSTOLIC ICA/CCA RATIO:  0.7 DIASTOLIC ICA/CCA RATIO: 1.2 ECA:                     129cm/sec LEFT ICA:                     52/23cm/sec CCA:                     [id number] SYSTOLIC ICA/CCA RATIO:  0.5 DIASTOLIC ICA/CCA RATIO: 1.1 ECA:                     117cm/sec FINDINGS: RIGHT CAROTID ARTERY: No significant plaque accumulation. No significant stenosis. Normal waveforms and color Doppler signal. RIGHT  VERTEBRAL ARTERY:  Normal flow direction and waveform. LEFT CAROTID ARTERY: No significant plaque accumulation or stenosis. Normal waveforms and color Doppler signal. LEFT VERTEBRAL ARTERY: Normal flow direction and waveform. IMPRESSION: 1. No significant carotid plaque or stenosis. 2.  Antegrade bilateral vertebral arterial flow. Electronically Signed   By: Lucrezia Europe M.D.   On: 01/23/2016 11:04   Dg Chest Port 1 View  01/22/2016  CLINICAL DATA:  Chest pain EXAM: PORTABLE CHEST 1 VIEW COMPARISON:  01/04/16 FINDINGS: Cardiac shadow is stable. The right jugular line is been removed in the interval. Some mild residual infiltrate is noted in the right upper lobe when compared with the prior exam but significantly improved. No new focal infiltrate is seen. Continued follow-up is recommended. No bony abnormality is noted. IMPRESSION: Persistent but significantly improved right upper lobe infiltrate. No new focal abnormality is seen. Continued follow-up is recommended. Electronically Signed   By: Inez Catalina M.D.   On: 01/22/2016 17:17    Micro Results     No results found for this or any previous visit (from the past 240 hour(s)).     Today   Subjective:   Gabriel [name] today has no headache,no chest abdominal pain,no new weakness tingling or numbness, feels much better wants to go home today.   Objective:   Blood pressure 141/99, pulse 81, temperature 98.2 F (36.8 C), temperature source Oral, resp. rate 18, height 6\' 1"  (1.854 m), weight 109.76 kg (241 lb 15.6 oz), SpO2 96 %.  No intake or output data in the 24 hours ending 02/18/16 1149  Exam Awake Alert, Oriented x 3, No new F.N deficits, Normal affect [place].AT,PERRAL Supple Neck,No JVD, No cervical lymphadenopathy appriciated.  Symmetrical Chest wall movement, Good air movement bilaterally, CTAB RRR,No Gallops,Rubs or new Murmurs, No Parasternal Heave +ve B.Sounds, Abd Soft, Non tender, No organomegaly appriciated, No rebound -guarding  or rigidity. No Cyanosis, Clubbing or edema, No new Rash or bruise  Data Review   CBC w Diff:  Lab Results  Component Value Date   WBC 10.2 02/15/2016   WBC 15.7* 10/07/2013   HGB 13.5 02/15/2016   HGB 15.5 10/07/2013   HCT 39.2* 02/15/2016   HCT 46.1 10/07/2013   PLT 175 02/15/2016   PLT 227 10/07/2013   LYMPHOPCT 9% 01/06/2016   LYMPHOPCT 29.3 08/26/2012   MONOPCT 7% 01/06/2016   MONOPCT 11.3 08/26/2012   EOSPCT 1% 01/06/2016   EOSPCT 1.0 08/26/2012   BASOPCT 1% 01/06/2016   BASOPCT 0.7 08/26/2012    CMP:  Lab Results  Component Value Date   NA 137 02/17/2016   NA 142 01/17/2016  NA 139 10/07/2013   K 4.3 02/17/2016   K 3.8 10/07/2013   CL 106 02/17/2016   CL 107 10/07/2013   CO2 27 02/17/2016   CO2 25 10/07/2013   BUN 22* 02/17/2016   BUN 25* 01/17/2016   BUN 10 10/07/2013   CREATININE 1.05 02/17/2016   CREATININE 0.77 10/07/2013   PROT 7.5 02/15/2016   PROT 8.3* 11/12/2012   ALBUMIN 3.7 02/15/2016   ALBUMIN 4.2 11/12/2012   BILITOT 0.7 02/15/2016   BILITOT 1.7* 11/12/2012   ALKPHOS 83 02/15/2016   ALKPHOS 83 11/12/2012   AST 23 02/15/2016   AST 34 11/12/2012   ALT 21 02/15/2016   ALT 57 11/12/2012  .   Total Time in preparing paper work, data evaluation and todays exam - 5 minutes  Sabrena Gavitt M.D on 02/17/2016 at 11:49 AM    Note: This dictation was prepared with Dragon dictation along with smaller phrase technology. Any transcriptional errors that result from this process are unintentional.

## 2016-02-19 ENCOUNTER — Encounter: Payer: Self-pay | Admitting: *Deleted

## 2016-02-19 ENCOUNTER — Telehealth: Payer: Self-pay | Admitting: Cardiovascular Disease

## 2016-02-19 NOTE — Telephone Encounter (Signed)
Received records request from patient and had to send back to patient due to no signature and sent [employer] package to patient as well.

## 2016-02-29 ENCOUNTER — Ambulatory Visit (INDEPENDENT_AMBULATORY_CARE_PROVIDER_SITE_OTHER): Payer: Self-pay

## 2016-02-29 ENCOUNTER — Other Ambulatory Visit: Payer: Self-pay

## 2016-02-29 DIAGNOSIS — I272 Pulmonary hypertension, unspecified: Secondary | ICD-10-CM

## 2016-02-29 DIAGNOSIS — I429 Cardiomyopathy, unspecified: Secondary | ICD-10-CM

## 2016-02-29 HISTORY — DX: Pulmonary hypertension, unspecified: I27.20

## 2016-02-29 NOTE — Telephone Encounter (Signed)
Patient brought in medical exam report form to be completed by physician.  Scanned along with [facility] release form into patient chart and alerted Latonya at [facility] .   Physician form placed in nurse inbox for completion by md.

## 2016-02-29 NOTE — Telephone Encounter (Signed)
Patient dropped off form for medicaid that needs to be filled out. Informed patient that they are out of the office this week but that someone would review next week. He verbalized understanding and had no further questions at this time. Form placed on Mandi's desk.

## 2016-03-01 NOTE — Telephone Encounter (Signed)
Error, forms discarded and [employer] will process. Questions please see Anderson Malta.

## 2016-03-05 ENCOUNTER — Ambulatory Visit: Payer: Self-pay

## 2016-03-05 NOTE — Telephone Encounter (Signed)
2nd request with additional records added received from [facility] .  Scanned to documents and faxed to [employer].

## 2016-03-08 ENCOUNTER — Ambulatory Visit (INDEPENDENT_AMBULATORY_CARE_PROVIDER_SITE_OTHER): Payer: Self-pay | Admitting: Cardiovascular Disease

## 2016-03-08 ENCOUNTER — Encounter: Payer: Self-pay | Admitting: Cardiovascular Disease

## 2016-03-08 ENCOUNTER — Encounter (INDEPENDENT_AMBULATORY_CARE_PROVIDER_SITE_OTHER): Payer: Self-pay

## 2016-03-08 VITALS — BP 176/110 | HR 78 | Ht 72.0 in | Wt 246.0 lb

## 2016-03-08 DIAGNOSIS — R079 Chest pain, unspecified: Secondary | ICD-10-CM

## 2016-03-08 MED ORDER — AMLODIPINE BESYLATE 10 MG PO TABS: 10.0000 mg | ORAL_TABLET | Freq: Every day | ORAL | Status: DC

## 2016-03-08 MED ORDER — LISINOPRIL 20 MG PO TABS: 20.0000 mg | ORAL_TABLET | Freq: Every day | ORAL | Status: DC

## 2016-03-08 MED ORDER — METOPROLOL SUCCINATE ER 25 MG PO TB24: 25.0000 mg | ORAL_TABLET | Freq: Every day | ORAL | Status: DC

## 2016-03-08 NOTE — Patient Instructions (Addendum)
Medication Instructions:   Please start amlodipine one a day Please start metoprolol succinate one a day Please start lisinopril one a day   Follow-Up: It was a pleasure seeing you in the office today. Please call us if you have new issues that need to be addressed before your next appt.  [phone]  Your physician wants you to follow-up in: 6 months.  You will receive a reminder letter in the mail two months in advance. If you don't receive a letter, please call our office to schedule the follow-up appointment.  If you need a refill on your cardiac medications before your next appointment, please call your pharmacy.
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
## 2016-03-14 ENCOUNTER — Telehealth: Payer: Self-pay | Admitting: Cardiovascular Disease

## 2016-03-14 NOTE — Telephone Encounter (Signed)
Pt calling asking if we can write him a letter (which he would like to pick up) so that he can go back to work. Please advise

## 2016-03-14 NOTE — Telephone Encounter (Signed)
Is he cleared to return to work? Any restrictions?

## 2016-03-15 ENCOUNTER — Inpatient Hospital Stay
Admission: EM | Admit: 2016-03-15 | Discharge: 2016-03-16 | DRG: 871 | Disposition: A | Discharge status: Home / Self Care | Payer: Self-pay | Attending: Internal Medicine | Admitting: Internal Medicine

## 2016-03-15 ENCOUNTER — Emergency Department: Payer: Self-pay

## 2016-03-15 ENCOUNTER — Encounter: Payer: Self-pay | Admitting: Urgent Care

## 2016-03-15 DIAGNOSIS — I10 Essential (primary) hypertension: Secondary | ICD-10-CM

## 2016-03-15 DIAGNOSIS — L409 Psoriasis, unspecified: Secondary | ICD-10-CM | POA: Diagnosis present

## 2016-03-15 DIAGNOSIS — Z7982 Long term (current) use of aspirin: Secondary | ICD-10-CM

## 2016-03-15 DIAGNOSIS — Z6830 Body mass index (BMI) 30.0-30.9, adult: Secondary | ICD-10-CM

## 2016-03-15 DIAGNOSIS — J189 Pneumonia, unspecified organism: Secondary | ICD-10-CM

## 2016-03-15 DIAGNOSIS — J432 Centrilobular emphysema: Secondary | ICD-10-CM

## 2016-03-15 DIAGNOSIS — I5043 Acute on chronic combined systolic (congestive) and diastolic (congestive) heart failure: Secondary | ICD-10-CM

## 2016-03-15 DIAGNOSIS — Z833 Family history of diabetes mellitus: Secondary | ICD-10-CM

## 2016-03-15 DIAGNOSIS — J9600 Acute respiratory failure, unspecified whether with hypoxia or hypercapnia: Secondary | ICD-10-CM | POA: Diagnosis present

## 2016-03-15 DIAGNOSIS — J440 Chronic obstructive pulmonary disease with acute lower respiratory infection: Secondary | ICD-10-CM | POA: Diagnosis present

## 2016-03-15 DIAGNOSIS — R0603 Acute respiratory distress: Secondary | ICD-10-CM | POA: Diagnosis present

## 2016-03-15 DIAGNOSIS — E782 Mixed hyperlipidemia: Secondary | ICD-10-CM | POA: Diagnosis present

## 2016-03-15 DIAGNOSIS — I110 Hypertensive heart disease with heart failure: Secondary | ICD-10-CM | POA: Diagnosis present

## 2016-03-15 DIAGNOSIS — R079 Chest pain, unspecified: Secondary | ICD-10-CM

## 2016-03-15 DIAGNOSIS — Z8249 Family history of ischemic heart disease and other diseases of the circulatory system: Secondary | ICD-10-CM

## 2016-03-15 DIAGNOSIS — F17210 Nicotine dependence, cigarettes, uncomplicated: Secondary | ICD-10-CM | POA: Diagnosis present

## 2016-03-15 DIAGNOSIS — I25118 Atherosclerotic heart disease of native coronary artery with other forms of angina pectoris: Secondary | ICD-10-CM | POA: Diagnosis present

## 2016-03-15 DIAGNOSIS — IMO0001 Reserved for inherently not codable concepts without codable children: Secondary | ICD-10-CM | POA: Diagnosis present

## 2016-03-15 DIAGNOSIS — E785 Hyperlipidemia, unspecified: Secondary | ICD-10-CM

## 2016-03-15 DIAGNOSIS — E669 Obesity, unspecified: Secondary | ICD-10-CM | POA: Diagnosis present

## 2016-03-15 DIAGNOSIS — J449 Chronic obstructive pulmonary disease, unspecified: Secondary | ICD-10-CM | POA: Diagnosis present

## 2016-03-15 DIAGNOSIS — I429 Cardiomyopathy, unspecified: Secondary | ICD-10-CM | POA: Diagnosis present

## 2016-03-15 DIAGNOSIS — Z888 Allergy status to other drugs, medicaments and biological substances status: Secondary | ICD-10-CM

## 2016-03-15 DIAGNOSIS — Z809 Family history of malignant neoplasm, unspecified: Secondary | ICD-10-CM

## 2016-03-15 DIAGNOSIS — I2510 Atherosclerotic heart disease of native coronary artery without angina pectoris: Secondary | ICD-10-CM

## 2016-03-15 DIAGNOSIS — Z79899 Other long term (current) drug therapy: Secondary | ICD-10-CM

## 2016-03-15 DIAGNOSIS — R0600 Dyspnea, unspecified: Secondary | ICD-10-CM

## 2016-03-15 DIAGNOSIS — E119 Type 2 diabetes mellitus without complications: Secondary | ICD-10-CM | POA: Diagnosis present

## 2016-03-15 DIAGNOSIS — I248 Other forms of acute ischemic heart disease: Secondary | ICD-10-CM | POA: Diagnosis present

## 2016-03-15 DIAGNOSIS — Z9114 Patient's other noncompliance with medication regimen: Secondary | ICD-10-CM

## 2016-03-15 DIAGNOSIS — Z720 Tobacco use: Secondary | ICD-10-CM

## 2016-03-15 DIAGNOSIS — J441 Chronic obstructive pulmonary disease with (acute) exacerbation: Secondary | ICD-10-CM | POA: Diagnosis present

## 2016-03-15 DIAGNOSIS — A419 Sepsis, unspecified organism: Principal | ICD-10-CM | POA: Diagnosis present

## 2016-03-15 DIAGNOSIS — F17200 Nicotine dependence, unspecified, uncomplicated: Secondary | ICD-10-CM | POA: Diagnosis present

## 2016-03-15 LAB — BASIC METABOLIC PANEL
Anion gap: 9 (ref 5–15)
BUN: 18 mg/dL (ref 6–20)
CO2: 23 mmol/L (ref 22–32)
Calcium: 9.4 mg/dL (ref 8.9–10.3)
Chloride: 109 mmol/L (ref 101–111)
Creatinine, Ser: 1.03 mg/dL (ref 0.61–1.24)
GFR calc Af Amer: >60 mL/min (ref >60)
GFR calc non Af Amer: >60 mL/min (ref >60)
Glucose, Bld: 105 mg/dL — ABNORMAL HIGH (ref 65–99)
Potassium: 3.6 mmol/L (ref 3.5–5.1)
Sodium: 141 mmol/L (ref 135–145)

## 2016-03-15 LAB — CBC
HCT: 45.1 % (ref 40.0–52.0)
Hemoglobin: 15.2 g/dL (ref 13.0–18.0)
MCH: 27.5 pg (ref 26.0–34.0)
MCHC: 33.7 g/dL (ref 32.0–36.0)
MCV: 81.5 fL (ref 80.0–100.0)
Platelets: 195 10*3/uL (ref 150–440)
RBC: 5.54 MIL/uL (ref 4.40–5.90)
RDW: 16.7 % — ABNORMAL HIGH (ref 11.5–14.5)
WBC: 13.6 10*3/uL — ABNORMAL HIGH (ref 3.8–10.6)

## 2016-03-15 LAB — URINE DRUG SCREEN, QUALITATIVE (ARMC ONLY)
Amphetamines, Ur Screen: NOT DETECTED
Barbiturates, Ur Screen: NOT DETECTED
Benzodiazepine, Ur Scrn: NOT DETECTED
Cannabinoid 50 Ng, Ur ~~LOC~~: NOT DETECTED
Cocaine Metabolite,Ur ~~LOC~~: NOT DETECTED
MDMA (Ecstasy)Ur Screen: NOT DETECTED
Methadone Scn, Ur: NOT DETECTED
Opiate, Ur Screen: POSITIVE — AB
Phencyclidine (PCP) Ur S: NOT DETECTED
Tricyclic, Ur Screen: NOT DETECTED

## 2016-03-15 LAB — BRAIN NATRIURETIC PEPTIDE: B Natriuretic Peptide: 925 pg/mL — ABNORMAL HIGH (ref 0.0–100.0)

## 2016-03-15 LAB — TROPONIN I
Troponin I: 0.07 ng/mL (ref <0.03)
Troponin I: 0.06 ng/mL (ref <0.03)
Troponin I: 0.07 ng/mL (ref <0.03)
Troponin I: 0.05 ng/mL (ref <0.03)

## 2016-03-15 LAB — MRSA PCR SCREENING: MRSA by PCR: NEGATIVE

## 2016-03-15 MED ORDER — MORPHINE SULFATE (PF) 4 MG/ML IV SOLN
4.0000 mg | Freq: Once | INTRAVENOUS | Status: AC
  Administered 2016-03-15: 4 mg via INTRAVENOUS
  Filled 2016-03-15: qty 1

## 2016-03-15 MED ORDER — HYDROCODONE-ACETAMINOPHEN 5-325 MG PO TABS
1.0000 | ORAL_TABLET | ORAL | Status: DC | PRN
  Administered 2016-03-15 – 2016-03-16 (×5): 2 via ORAL
  Filled 2016-03-15 (×3): qty 2
  Filled 2016-03-15: qty 1
  Filled 2016-03-15 (×2): qty 2

## 2016-03-15 MED ORDER — FUROSEMIDE 10 MG/ML IJ SOLN
INTRAMUSCULAR | Status: AC
  Administered 2016-03-15: 40 mg via INTRAVENOUS
  Filled 2016-03-15: qty 4

## 2016-03-15 MED ORDER — LISINOPRIL 10 MG PO TABS
ORAL_TABLET | ORAL | Status: AC
Start: 2016-03-15 — End: 2016-03-15
  Administered 2016-03-15: 20 mg via ORAL
  Filled 2016-03-15: qty 2

## 2016-03-15 MED ORDER — AMLODIPINE BESYLATE 10 MG PO TABS
10.0000 mg | ORAL_TABLET | Freq: Every day | ORAL | Status: DC
  Administered 2016-03-16: 10 mg via ORAL
  Filled 2016-03-15: qty 1

## 2016-03-15 MED ORDER — LEVOFLOXACIN IN D5W 750 MG/150ML IV SOLN
INTRAVENOUS | Status: AC
Start: 2016-03-15 — End: 2016-03-15
  Administered 2016-03-15: 750 mg via INTRAVENOUS
  Filled 2016-03-15: qty 150

## 2016-03-15 MED ORDER — DOCUSATE SODIUM 100 MG PO CAPS
100.0000 mg | ORAL_CAPSULE | Freq: Two times a day (BID) | ORAL | Status: DC
  Administered 2016-03-16: 100 mg via ORAL
  Filled 2016-03-15 (×2): qty 1

## 2016-03-15 MED ORDER — FUROSEMIDE 10 MG/ML IJ SOLN
40.0000 mg | Freq: Four times a day (QID) | INTRAMUSCULAR | Status: AC
  Administered 2016-03-15 (×2): 40 mg via INTRAVENOUS
  Filled 2016-03-15 (×2): qty 4

## 2016-03-15 MED ORDER — LEVOFLOXACIN IN D5W 750 MG/150ML IV SOLN
750.0000 mg | Freq: Once | INTRAVENOUS | Status: AC
  Administered 2016-03-15: 750 mg via INTRAVENOUS

## 2016-03-15 MED ORDER — ENOXAPARIN SODIUM 40 MG/0.4ML ~~LOC~~ SOLN
40.0000 mg | SUBCUTANEOUS | Status: DC
  Administered 2016-03-15 – 2016-03-16 (×2): 40 mg via SUBCUTANEOUS
  Filled 2016-03-15 (×2): qty 0.4

## 2016-03-15 MED ORDER — ASPIRIN 81 MG PO CHEW
324.0000 mg | CHEWABLE_TABLET | Freq: Once | ORAL | Status: AC
  Administered 2016-03-15: 324 mg via ORAL
  Filled 2016-03-15: qty 4

## 2016-03-15 MED ORDER — FUROSEMIDE 10 MG/ML IJ SOLN
40.0000 mg | Freq: Once | INTRAMUSCULAR | Status: AC
  Administered 2016-03-15: 40 mg via INTRAVENOUS

## 2016-03-15 MED ORDER — ONDANSETRON HCL 4 MG/2ML IJ SOLN: 4.0000 mg | Freq: Four times a day (QID) | INTRAMUSCULAR | Status: DC | PRN

## 2016-03-15 MED ORDER — MORPHINE SULFATE (PF) 2 MG/ML IV SOLN
2.0000 mg | INTRAVENOUS | Status: DC | PRN
  Administered 2016-03-15 (×2): 2 mg via INTRAVENOUS
  Filled 2016-03-15 (×2): qty 1

## 2016-03-15 MED ORDER — VANCOMYCIN HCL IN DEXTROSE 1-5 GM/200ML-% IV SOLN
1000.0000 mg | Freq: Three times a day (TID) | INTRAVENOUS | Status: DC
Start: 2016-03-15 — End: 2016-03-15
  Filled 2016-03-15 (×2): qty 200

## 2016-03-15 MED ORDER — ACETAMINOPHEN 650 MG RE SUPP: 650.0000 mg | Freq: Four times a day (QID) | RECTAL | Status: DC | PRN

## 2016-03-15 MED ORDER — SODIUM CHLORIDE 0.9% FLUSH
3.0000 mL | Freq: Two times a day (BID) | INTRAVENOUS | Status: DC
  Administered 2016-03-15 (×2): 3 mL via INTRAVENOUS

## 2016-03-15 MED ORDER — LISINOPRIL 10 MG PO TABS
20.0000 mg | ORAL_TABLET | ORAL | Status: AC
  Administered 2016-03-15: 20 mg via ORAL

## 2016-03-15 MED ORDER — SODIUM CHLORIDE 0.9 % IV SOLN: INTRAVENOUS | Status: DC

## 2016-03-15 MED ORDER — FUROSEMIDE 40 MG PO TABS: 40.0000 mg | ORAL_TABLET | ORAL | Status: DC

## 2016-03-15 MED ORDER — VANCOMYCIN HCL 10 G IV SOLR
1250.0000 mg | Freq: Once | INTRAVENOUS | Status: AC
  Administered 2016-03-15: 1250 mg via INTRAVENOUS
  Filled 2016-03-15: qty 1250

## 2016-03-15 MED ORDER — ACETAMINOPHEN 325 MG PO TABS: 650.0000 mg | ORAL_TABLET | Freq: Four times a day (QID) | ORAL | Status: DC | PRN

## 2016-03-15 MED ORDER — ASPIRIN 81 MG PO CHEW
81.0000 mg | CHEWABLE_TABLET | Freq: Every day | ORAL | Status: DC
  Administered 2016-03-16: 81 mg via ORAL
  Filled 2016-03-15: qty 1

## 2016-03-15 MED ORDER — POTASSIUM CHLORIDE CRYS ER 20 MEQ PO TBCR: 40.0000 meq | EXTENDED_RELEASE_TABLET | Freq: Two times a day (BID) | ORAL | Status: DC

## 2016-03-15 MED ORDER — AMLODIPINE BESYLATE 5 MG PO TABS
ORAL_TABLET | ORAL | Status: AC
  Administered 2016-03-15: 10 mg via ORAL
  Filled 2016-03-15: qty 2

## 2016-03-15 MED ORDER — AMLODIPINE BESYLATE 5 MG PO TABS
10.0000 mg | ORAL_TABLET | ORAL | Status: AC
  Administered 2016-03-15: 10 mg via ORAL

## 2016-03-15 MED ORDER — METOPROLOL SUCCINATE ER 25 MG PO TB24
25.0000 mg | ORAL_TABLET | Freq: Every day | ORAL | Status: DC
  Administered 2016-03-15 – 2016-03-16 (×2): 25 mg via ORAL
  Filled 2016-03-15 (×2): qty 1

## 2016-03-15 MED ORDER — ONDANSETRON HCL 4 MG PO TABS: 4.0000 mg | ORAL_TABLET | Freq: Four times a day (QID) | ORAL | Status: DC | PRN

## 2016-03-15 MED ORDER — POTASSIUM CHLORIDE CRYS ER 20 MEQ PO TBCR
40.0000 meq | EXTENDED_RELEASE_TABLET | Freq: Two times a day (BID) | ORAL | Status: DC
  Administered 2016-03-15 – 2016-03-16 (×3): 40 meq via ORAL
  Filled 2016-03-15 (×3): qty 2

## 2016-03-15 MED ORDER — LISINOPRIL 20 MG PO TABS
20.0000 mg | ORAL_TABLET | Freq: Every day | ORAL | Status: DC
  Administered 2016-03-16: 20 mg via ORAL
  Filled 2016-03-15: qty 1

## 2016-03-15 MED ORDER — LEVOFLOXACIN IN D5W 750 MG/150ML IV SOLN
750.0000 mg | INTRAVENOUS | Status: DC
  Administered 2016-03-16: 750 mg via INTRAVENOUS
  Filled 2016-03-15: qty 150

## 2016-03-15 MED ORDER — AMIODARONE HCL 200 MG PO TABS
200.0000 mg | ORAL_TABLET | Freq: Two times a day (BID) | ORAL | Status: DC
  Administered 2016-03-15 – 2016-03-16 (×3): 200 mg via ORAL
  Filled 2016-03-15 (×3): qty 1

## 2016-03-15 NOTE — ED Triage Notes (Signed)
Patient presents to ED tonight with c/o LEFT sided CP with (+) radiation into LUE that began approximately 45 minutes PTA. (+) SOB and nausea. PMH significant for nonischemic cardiomyopathy and systolic CHF. Patient is under the care of [name].

## 2016-03-15 NOTE — Consult Note (Signed)
Name: Gabriel [name]. MRN: [id number] DOB: 1970/10/21    ADMISSION DATE:  03/15/2016 CONSULTATION DATE:  03/15/2016  REFERRING MD :  Dr. Manuella Ghazi  CHIEF COMPLAINT:  Chest pain and Shortness of Breath   BRIEF PATIENT DESCRIPTION: This is a 45 yo male who presented to [facility] ER on 7/28 with c/o chest pain with left arm numbness that began at rest approximately 1 hr prior to presentation to the hospital.  PCCM consulted 7/28 due to recurrent Pneumonia and COPD.   SIGNIFICANT EVENTS  7/28-Pt admitted to [facility] due to Pneumonia, Sepsis, and Chest Pain   STUDIES:  Urine Drug Screen 7/28>>positive for opioids   HISTORY OF PRESENT ILLNESS:   This is a 45 yo male who presented to [facility] ER on 7/28 with c/o chest pain with left arm numbness that began at rest approximately 1 hr prior to presentation to the hospital.  He took 4 baby aspirin but this did not relieve symptoms.  He also c/o nausea however denies emesis.  The chest pain occurred across his chest bilaterally.  Pt was admitted to the hospital 1 month ago and completed a course Augmentin due to diagnosis of Pneumonia.  Per ER notes upon arrival to ER pt was tachypneic and mildly hypoxic and septic. Pt does have a 34 year smoking hx smoked up to 2 packs per day however currently smokes occasionally.  Denies exposure to wood burning stove, exotic pets, or heavy machinery. PCCM consulted 7/28 due to recurrent Pneumonia and COPD.    PAST MEDICAL HISTORY :   has a past medical history of Cardiomyopathy ([place]); CHF (congestive heart failure) ([place]); COPD (chronic obstructive pulmonary disease) ([place]); Diabetes mellitus without complication ([place]); Hypertension; Obesity (BMI 30.0-34.9); Psoriasis; and Systolic CHF ([place]).  has a past surgical history that includes Amputation and Finger amputation. Prior to Admission medications   Medication Sig Start Date End Date Taking? Authorizing Provider  amiodarone (PACERONE) 200 MG tablet Take 1 tablet (200 mg  total) by mouth 2 (two) times daily. 02/08/16  Yes Minna Merritts, MD  amLODipine (NORVASC) 10 MG tablet Take 1 tablet (10 mg total) by mouth daily. 03/08/16  Yes Minna Merritts, MD  aspirin 81 MG chewable tablet Chew 81 mg by mouth daily. Reported on 02/08/2016   Yes Historical Provider, MD  furosemide (LASIX) 40 MG tablet Take 1 tablet (40 mg total) by mouth every other day. 02/08/16 02/07/17 Yes Minna Merritts, MD  lisinopril (PRINIVIL,ZESTRIL) 20 MG tablet Take 1 tablet (20 mg total) by mouth daily. 03/08/16  Yes Minna Merritts, MD  metoprolol succinate (TOPROL-XL) 25 MG 24 hr tablet Take 1 tablet (25 mg total) by mouth daily. Take with or immediately following a meal. 03/08/16  Yes Minna Merritts, MD   Allergies  Allergen Reactions  . Prednisone Other (See Comments)    Reaction: Hallucinations     FAMILY HISTORY:  family history includes Cancer in his maternal grandfather and paternal aunt; Diabetes Mellitus II in his mother; Hypertension in his father. SOCIAL HISTORY:  reports that he has been smoking Cigarettes.  He has been smoking about 0.00 packs per day for the past 33.00 years. He has never used smokeless tobacco. He reports that he drinks about 1.8 oz of alcohol per week . He reports that he does not use drugs.  REVIEW OF SYSTEMS:  Positives in BOLD Constitutional:  fever, chills, weight loss, malaise/fatigue and diaphoresis.  HENT: Negative for hearing loss, ear pain, nosebleeds, congestion,  sore throat, neck pain, tinnitus and ear discharge.   Eyes: Negative for blurred vision, double vision, photophobia, pain, discharge and redness.  Respiratory: cough, hemoptysis, sputum production, shortness of breath, wheezing and stridor.   Cardiovascular: chest pain, palpitations, orthopnea, claudication, leg swelling and PND.  Gastrointestinal:heartburn, nausea, vomiting, abdominal pain, diarrhea, constipation, blood in stool and melena.  Genitourinary: Negative for dysuria,  urgency, frequency, hematuria and flank pain.  Musculoskeletal: Negative for myalgias, back pain, joint pain and falls.  Skin: Negative for itching and rash.  Neurological: left arm numbness, dizziness, tingling, tremors, sensory change, speech change, focal weakness, seizures, loss of consciousness, weakness and headaches.  Endo/Heme/Allergies: Negative for environmental allergies and polydipsia. Does not bruise/bleed easily.  SUBJECTIVE:  Pt states he is no longer short of breath denies current chest pain.  VITAL SIGNS: Temp:  [98.3 F (36.8 C)-98.9 F (37.2 C)] 98.5 F (36.9 C) (07/28 1134) Pulse Rate:  [61-78] 68 (07/28 1406) Resp:  [11-35] 17 (07/28 1134) BP: (120-184)/(74-127) 120/74 (07/28 1406) SpO2:  [87 %-98 %] 96 % (07/28 1406) Weight:  [230 lb 12.8 oz (104.7 kg)-253 lb (114.8 kg)] 230 lb 12.8 oz (104.7 kg) (07/28 1134)  PHYSICAL EXAMINATION: General:  Well developed, well nourished Neuro:  Alert and oriented, follows commands HEENT:  Supple, no JVD Cardiovascular:  s1s2, rrr, no M/R/G Lungs:  Clear throughout, even, non labored Abdomen:  +BSx4, soft, non tender, non distended Musculoskeletal:  Normal bulk and tone Skin:  intact   Recent Labs Lab 03/15/16 0123  NA 141  K 3.6  CL 109  CO2 23  BUN 18  CREATININE 1.03  GLUCOSE 105*    Recent Labs Lab 03/15/16 0123  HGB 15.2  HCT 45.1  WBC 13.6*  PLT 195   Dg Chest 2 View  Result Date: 03/15/2016 CLINICAL DATA:  45 year old male with left-sided chest pain and shortness of breath EXAM: CHEST  2 VIEW COMPARISON:  Chest CT dated 02/15/2016 FINDINGS: There is a focal area of pleural-based opacity in the right upper lobe corresponding to the area of consolidation seen on the prior CT. There has been minimal interval improvement compared to the radiograph dated 02/15/2016. Is a small right pleural effusion. There is diffuse interstitial prominence and edema. Mild cardiomegaly. No acute osseous pathology.  IMPRESSION: Persistent focal opacity in the right upper lobe, minimally improved since radiograph dated 02/15/2016. Small right pleural effusion, new from radiographs dated 02/15/2016. Follow-up to resolution recommended. Electronically Signed   By: Anner Crete M.D.   On: 03/15/2016 01:44   ASSESSMENT / PLAN: Acute Respiratory Failure Secondary likely secondary to Pneumonia  ?COPD  Tobacco Abuse  Review of patient's CXR images, the right lung infiltrate appears to be signfiicantly improved, given its previous density, it appears to be taking a prolonged but expected course.   Plan Supplemental O2 as needed to maintain O2 sats 92% or above Continue Levaquin MRSA PCR negative, no need for vanco.  Will need outpatient pulmonology follow-up to establish care and perform pulmonary function test.  Pt would like to see Dr. Raul Del Repeat CXR in 4-6 weeks to ensure that this is resolving.  Trend WBC and monitor fever curve   Incentive Spirometry and Flutter valve  Marda Stalker, [place] Pager [phone] (please enter 7 digits) PCCM Consult Pager [phone] (please enter 7 digits)  Pt seen and examined with NP, agree with assessment and plan. Lungs CTA bilateraly, CXR infiltrate seems significantly improved. Recommend outpatient follow up with Dr. Raul Del (patient  prefers to see Dr. Raul Del) for PFT and repeat CXR.   Marda Stalker, M.D.  03/15/2016

## 2016-03-15 NOTE — ED Provider Notes (Signed)
[facility] Emergency Department Provider Note  ____________________________________________   First MD Initiated Contact with Patient 03/15/16 0221     (approximate)  I have reviewed the triage vital signs and the nursing notes.   HISTORY  Chief Complaint Chest Pain   HPI Gabriel [name]. is a 45 y.o. male with history of CHF cardiomyopathy diabetes hypertension presents to the emergency department with acute onset of left chest pain with radiation to the left upper extremity approximate 45 minutes before presentation. Patient also admits to dyspnea and nausea. Patient is followed by [name] cardiologist.   Past Medical History:  Diagnosis Date  . Cardiomyopathy ([place])   . CHF (congestive heart failure) ([place])   . COPD (chronic obstructive pulmonary disease) ([place])   . Diabetes mellitus without complication ([place])    Pre diabetic  . Hypertension    Resolved since weight loss  . Obesity (BMI 30.0-34.9)   . Psoriasis   . Systolic CHF [facility])     Patient Active Problem List   Diagnosis Date Noted  . Chest pain 02/16/2016  . Orthostatic hypotension 01/23/2016  . Diarrhea 01/23/2016  . Hypokalemia 01/23/2016  . Hypomagnesemia 01/23/2016  . Chronic systolic CHF (congestive heart failure) ([place]) 01/23/2016  . Syncope 01/22/2016  . Congestive dilated cardiomyopathy ([place]) 01/17/2016  . NSVT (nonsustained ventricular tachycardia) ([place])   . 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 CAP (community acquired pneumonia)   . Arterial hypotension   . Elevated troponin   . Chronic systolic heart failure ([place])   . Sepsis ([place]) 01/03/2016  . Type II diabetes mellitus ([place]) 05/17/2015  . Hyperlipidemia 05/17/2015  . COPD (chronic obstructive pulmonary disease) ([place]) 05/17/2015  . HTN (hypertension) 03/31/2015  . Smoking 03/31/2015  . CHF (congestive heart failure) ([place]) 03/20/2015    Past Surgical History:  Procedure Laterality Date  . AMPUTATION      . FINGER AMPUTATION     Traumatic    Prior to Admission medications   Medication Sig Start Date End Date Taking? Authorizing Provider  amiodarone (PACERONE) 200 MG tablet Take 1 tablet (200 mg total) by mouth 2 (two) times daily. 02/08/16  Yes Minna Merritts, MD  amLODipine (NORVASC) 10 MG tablet Take 1 tablet (10 mg total) by mouth daily. 03/08/16  Yes Minna Merritts, MD  aspirin 81 MG chewable tablet Chew 81 mg by mouth daily. Reported on 02/08/2016   Yes Historical Provider, MD  furosemide (LASIX) 40 MG tablet Take 1 tablet (40 mg total) by mouth every other day. 02/08/16 02/07/17 Yes Minna Merritts, MD  lisinopril (PRINIVIL,ZESTRIL) 20 MG tablet Take 1 tablet (20 mg total) by mouth daily. 03/08/16  Yes Minna Merritts, MD  metoprolol succinate (TOPROL-XL) 25 MG 24 hr tablet Take 1 tablet (25 mg total) by mouth daily. Take with or immediately following a meal. 03/08/16  Yes Minna Merritts, MD    Allergies Prednisone  Family History  Problem Relation Age of Onset  . Diabetes Mellitus II Mother   . Hypertension Father   . Cancer Paternal Aunt   . Cancer Maternal Grandfather     Social History Social History  Substance Use Topics  . Smoking status: Current Some Day Smoker    Packs/day: 0.00    Years: 33.00    Types: Cigarettes  . Smokeless tobacco: Never Used  . Alcohol use 1.8 oz/week    3 Cans of beer per week     Comment: occassionally  Review of Systems Constitutional: No fever/chills Eyes: No visual changes. ENT: No sore throat. Cardiovascular: Positive or brachial chest pain. Respiratory: Denies shortness of breath. Gastrointestinal: No abdominal pain.  No nausea, no vomiting.  No diarrhea.  No constipation. Genitourinary: Negative for dysuria. Musculoskeletal: Negative for back pain. Skin: Negative for rash. Neurological: Negative for headaches, focal weakness or numbness.  10-point ROS otherwise  negative.  ____________________________________________   PHYSICAL EXAM:  VITAL SIGNS: ED Triage Vitals  Enc Vitals Group     BP 03/15/16 0124 (!) 165/108     Pulse Rate 03/15/16 0124 76     Resp 03/15/16 0124 20     Temp 03/15/16 0124 98.3 F (36.8 C)     Temp Source 03/15/16 0124 Oral     SpO2 03/15/16 0124 93 %     Weight 03/15/16 0124 253 lb (114.8 kg)     Height 03/15/16 0124 6' (1.829 m)     Head Circumference --      Peak Flow --      Pain Score 03/15/16 0119 10     Pain Loc --      Pain Edu? --      Excl. in [place]? --     Constitutional: Alert and oriented. Apparent discomfort Eyes: Conjunctivae are normal. PERRL. EOMI. Head: Atraumatic. Ears:  Healthy appearing ear canals and TMs bilaterally Nose: No congestion/rhinnorhea. Mouth/Throat: Mucous membranes are moist.  Oropharynx non-erythematous. Neck: No stridor.  No meningeal signs. Cardiovascular: Normal rate, regular rhythm. Good peripheral circulation. Grossly normal heart sounds.   Respiratory: Normal respiratory effort.  No retractions. Lungs CTAB. Gastrointestinal: Soft and nontender. No distention.  Musculoskeletal: No lower extremity tenderness nor edema. No gross deformities of extremities. Neurologic:  Normal speech and language. No gross focal neurologic deficits are appreciated.  Skin:  Skin is warm, dry and intact. No rash noted. Psychiatric: Mood and affect are normal. Speech and behavior are normal.**}  ____________________________________________   LABS (all labs ordered are listed, but only abnormal results are displayed)  Labs Reviewed  BASIC METABOLIC PANEL - Abnormal; Notable for the following:       Result Value   Glucose, Bld 105 (*)    All other components within normal limits  CBC - Abnormal; Notable for the following:    WBC 13.6 (*)    RDW 16.7 (*)    All other components within normal limits  TROPONIN I - Abnormal; Notable for the following:    Troponin I 0.07 (*)    All other  components within normal limits  BRAIN NATRIURETIC PEPTIDE - Abnormal; Notable for the following:    B Natriuretic Peptide 925.0 (*)    All other components within normal limits  CULTURE, BLOOD (ROUTINE X 2)  CULTURE, BLOOD (ROUTINE X 2)   ____________________________________________  EKG  ED ECG REPORT I, [place] Akirah Storck, the attending physician, personally viewed and interpreted this ECG.   Date: 03/15/2016  EKG Time: 1:09 AM  Rate: 82  Rhythm: Normal sinus rhythm  Axis: Normal  Intervals: Normal  ST&T Change: None  ____________________________________________  RADIOLOGY I, [place] Siedah Sedor, personally viewed and evaluated these images (plain radiographs) as part of my medical decision making, as well as reviewing the written report by the radiologist.  Dg Chest 2 View  Result Date: 03/15/2016 CLINICAL DATA:  45 year old male with left-sided chest pain and shortness of breath EXAM: CHEST  2 VIEW COMPARISON:  Chest CT dated 02/15/2016 FINDINGS: There is a focal area of pleural-based  opacity in the right upper lobe corresponding to the area of consolidation seen on the prior CT. There has been minimal interval improvement compared to the radiograph dated 02/15/2016. Is a small right pleural effusion. There is diffuse interstitial prominence and edema. Mild cardiomegaly. No acute osseous pathology. IMPRESSION: Persistent focal opacity in the right upper lobe, minimally improved since radiograph dated 02/15/2016. Small right pleural effusion, new from radiographs dated 02/15/2016. Follow-up to resolution recommended. Electronically Signed   By: Anner Crete M.D.   On: 03/15/2016 01:44    Procedures   ____________________________________________   INITIAL IMPRESSION / ASSESSMENT AND PLAN / ED COURSE  Pertinent labs & imaging results that were available during my care of the patient were reviewed by me and considered in my medical decision making (see chart for  details).  Patient given aspirin 324 mg IV morphine with improvement of pain. Given history of physical exam concern for possible cardiac etiology for the patient's pain as such patient discussed with Dr. Marcille Blanco for hospital admission for further evaluation and management  Clinical Course    ____________________________________________  FINAL CLINICAL IMPRESSION(S) / ED DIAGNOSES  Final diagnoses:  None     MEDICATIONS GIVEN DURING THIS VISIT:  Medications  levofloxacin (LEVAQUIN) IVPB 750 mg (750 mg Intravenous New Bag/Given 03/15/16 0525)  vancomycin (VANCOCIN) 1,250 mg in sodium chloride 0.9 % 250 mL IVPB (not administered)  aspirin chewable tablet 324 mg (324 mg Oral Given 03/15/16 0226)  morphine 4 MG/ML injection 4 mg (4 mg Intravenous Given 03/15/16 0226)  morphine 4 MG/ML injection 4 mg (4 mg Intravenous Given 03/15/16 0420)  furosemide (LASIX) injection 40 mg (40 mg Intravenous Given 03/15/16 0444)  amLODipine (NORVASC) tablet 10 mg (10 mg Oral Given 03/15/16 0507)  lisinopril (PRINIVIL,ZESTRIL) tablet 20 mg (20 mg Oral Given 03/15/16 0507)     NEW OUTPATIENT MEDICATIONS STARTED DURING THIS VISIT:  New Prescriptions   No medications on file      Note:  This document was prepared using Dragon voice recognition software and may include unintentional dictation errors.    Gregor Hams, MD 03/15/16 [phone]

## 2016-03-15 NOTE — Consult Note (Signed)
Cardiology Consultation Note  Patient ID: Gabriel [name]., MRN: [id number], DOB/AGE: Aug 06, 1971 45 y.o. Admit date: 03/15/2016   Date of Consult: 03/15/2016 Primary Physician: No PCP Per Patient Primary Cardiologist: [name], MD Requesting Physician: Dr. Manuella Ghazi, MD  Chief Complaint: nonexertional chest pain with known nonobstructive CAD Reason for Consult: Acute on chronic systolic CHF  HPI: 45 y.o. male with h/o presumed nonischemic cardiomyopathy by nuclear stress test in [id number], chronic systolic CHF, COPD, ongoing tobacco abuse at 2 ppd since age 51, DM2, medication noncompliance, and obesity who was recently admitted to [facility] in mid May with Neisseria meningitis PNA and bacteremia and again to [facility] on 6/5 with syncope felt to be vasovagal. He returns to [facility] with nonexertional chest pain in the setting of persistent RUL PNA and noted to have pleural effusions.   Patient was previously admitted in August 2016 and September 2016 with atypical chest pain. Echo during August admission showed mildly reduced LV systolic function with mildly elevated troponin and BNP. He had been hypertensive and unable to afford his medications at that time. He was discharged back in August 2016 on antihypertensives, but did nto take them. During his September 2016 admission for atypical chest pain and HTN he underwent nuclear stress testing that showed a small defect of mild severity in the apex location felt to be 2/2 apical thinning. EF was estimated at 30-44%. Overall, this was an intermediate risk scan. There was no evidence of significant perfusion abnormalities and he was felt to have NICM. He never followed up with cardiology besides the CHF clinic during which no changes were made to his CHF regimen. Admitted in mid May with cough and SOB, found to have Neisseria meningitis PNA and bacteremia. Cardiology saw patient for elevated troponin in the setting of septic shock. Echo showed an EF of 20-25%, diffuse, HK,  RWMA could not be excluded, mild MR, LA moderately dilated, RV systolic function was normal, PASP was normal. CT chest revealed no coronary calcifications. He had short runs of NSVT on tele. He was started on amiodarone as an outpatient in hospital follow up on 5/31. He wore a Holter monitor to evaluate for arrhythmia, which showed only atrial tach. Admitted June 2016 for syncope felt to be vasovagal. No arrhythmia seen on tele. In hospital follow up he had been noncompliant with medications. He reported fatigue, though was able to go swimming with friends and family. His medications were restarted.   Recent echo from 02/29/16 showed an EF of 30-35%, diffuse HK, LV diastolic function was normal, mild AI/MR, PASP 47 mmHg.   He returns to [facility] this time with nonexertional chest pain in the setting of persistent RUL PNA. He has also noted fevers, chills, nausea, and SOB. Upon the patient's arrival to [facility] they were found to have BNP 925, troponin 0.07, WBC 13.6, HGB 15.2, SCr 1.03, K+ 3.6, blood cultures pending. ECG as below, CXR showed persistent RUL PNA with small right pleural effusion.   Past Medical History:  Diagnosis Date  . Cardiomyopathy ([place])   . CHF (congestive heart failure) ([place])   . COPD (chronic obstructive pulmonary disease) ([place])   . Diabetes mellitus without complication ([place])    Pre diabetic  . Hypertension    Resolved since weight loss  . Obesity (BMI 30.0-34.9)   . Psoriasis   . Systolic CHF ([place])       Most Recent Cardiac Studies: As above   Surgical History:  Past Surgical History:  Procedure Laterality  Date  . AMPUTATION    . FINGER AMPUTATION     Traumatic     Home Meds: Prior to Admission medications   Medication Sig Start Date End Date Taking? Authorizing Provider  amiodarone (PACERONE) 200 MG tablet Take 1 tablet (200 mg total) by mouth 2 (two) times daily. 02/08/16  Yes Minna Merritts, MD  amLODipine (NORVASC) 10 MG tablet Take 1 tablet (10 mg total) by  mouth daily. 03/08/16  Yes Minna Merritts, MD  aspirin 81 MG chewable tablet Chew 81 mg by mouth daily. Reported on 02/08/2016   Yes Historical Provider, MD  furosemide (LASIX) 40 MG tablet Take 1 tablet (40 mg total) by mouth every other day. 02/08/16 02/07/17 Yes Minna Merritts, MD  lisinopril (PRINIVIL,ZESTRIL) 20 MG tablet Take 1 tablet (20 mg total) by mouth daily. 03/08/16  Yes Minna Merritts, MD  metoprolol succinate (TOPROL-XL) 25 MG 24 hr tablet Take 1 tablet (25 mg total) by mouth daily. Take with or immediately following a meal. 03/08/16  Yes Minna Merritts, MD    Inpatient Medications:  . amiodarone  200 mg Oral BID  . [START ON 03/16/2016] amLODipine  10 mg Oral Daily  . aspirin  81 mg Oral Daily  . docusate sodium  100 mg Oral BID  . enoxaparin (LOVENOX) injection  40 mg Subcutaneous Q24H  . furosemide  40 mg Oral QODAY  . [START ON 03/16/2016] levofloxacin (LEVAQUIN) IV  750 mg Intravenous Q24H  . [START ON 03/16/2016] lisinopril  20 mg Oral Daily  . metoprolol succinate  25 mg Oral QPC breakfast  . sodium chloride flush  3 mL Intravenous Q12H  . vancomycin  1,250 mg Intravenous Once  . vancomycin  1,000 mg Intravenous Q8H   . sodium chloride      Allergies:  Allergies  Allergen Reactions  . Prednisone Other (See Comments)    Reaction: Hallucinations     Social History   Social History  . Marital status: Divorced    Spouse name: N/A  . Number of children: N/A  . Years of education: N/A   Occupational History  . Not on file.   Social History Main Topics  . Smoking status: Current Some Day Smoker    Packs/day: 0.00    Years: 33.00    Types: Cigarettes  . Smokeless tobacco: Never Used  . Alcohol use 1.8 oz/week    3 Cans of beer per week     Comment: occassionally  . Drug use: No  . Sexual activity: Not on file   Other Topics Concern  . Not on file   Social History Narrative  . No narrative on file     Family History  Problem Relation Age of  Onset  . Diabetes Mellitus II Mother   . Hypertension Father   . Cancer Paternal Aunt   . Cancer Maternal Grandfather      Review of Systems: Review of Systems  Constitutional: Positive for chills, fever and malaise/fatigue. Negative for diaphoresis and weight loss.  HENT: Negative for congestion.   Eyes: Negative for discharge and redness.  Respiratory: Positive for sputum production, shortness of breath and wheezing. Negative for cough and hemoptysis.   Cardiovascular: Positive for chest pain. Negative for palpitations, orthopnea, claudication, leg swelling and PND.  Gastrointestinal: Positive for nausea. Negative for abdominal pain, heartburn, melena and vomiting.  Genitourinary: Negative for hematuria.  Musculoskeletal: Negative for falls and myalgias.  Skin: Negative for rash.  Neurological: Positive for  weakness. Negative for dizziness, tingling, tremors, sensory change, speech change, focal weakness and loss of consciousness.  Endo/Heme/Allergies: Does not bruise/bleed easily.  Psychiatric/Behavioral: Negative for substance abuse. The patient is not nervous/anxious.   All other systems reviewed and are negative.   Labs:  Recent Labs  03/15/16 0123  TROPONINI 0.07*   Lab Results  Component Value Date   WBC 13.6 (H) 03/15/2016   HGB 15.2 03/15/2016   HCT 45.1 03/15/2016   MCV 81.5 03/15/2016   PLT 195 03/15/2016     Recent Labs Lab 03/15/16 0123  NA 141  K 3.6  CL 109  CO2 23  BUN 18  CREATININE 1.03  CALCIUM 9.4  GLUCOSE 105*   Lab Results  Component Value Date   CHOL 74 01/04/2016   HDL 14 (L) 01/04/2016   LDLCALC 46 01/04/2016   TRIG 71 01/04/2016   No results found for: DDIMER  Radiology/Studies:  Dg Chest 2 View  Result Date: 03/15/2016 CLINICAL DATA:  45 year old male with left-sided chest pain and shortness of breath EXAM: CHEST  2 VIEW COMPARISON:  Chest CT dated 02/15/2016 FINDINGS: There is a focal area of pleural-based opacity in the  right upper lobe corresponding to the area of consolidation seen on the prior CT. There has been minimal interval improvement compared to the radiograph dated 02/15/2016. Is a small right pleural effusion. There is diffuse interstitial prominence and edema. Mild cardiomegaly. No acute osseous pathology. IMPRESSION: Persistent focal opacity in the right upper lobe, minimally improved since radiograph dated 02/15/2016. Small right pleural effusion, new from radiographs dated 02/15/2016. Follow-up to resolution recommended. Electronically Signed   By: Anner Crete M.D.   On: 03/15/2016 01:44  Dg Chest 2 View  Result Date: 02/15/2016 CLINICAL DATA:  Sudden onset of left-sided chest pain and dyspnea. EXAM: CHEST  2 VIEW COMPARISON:  01/22/2016 FINDINGS: Persistent right upper lobe airspace opacity, unchanged from 01/22/2016 although it has improved from 01/04/2016. Left lung is clear. No pleural effusions. Normal pulmonary vasculature. Unremarkable hilar and mediastinal contours. IMPRESSION: Persistent right upper lobe airspace opacity consistent with pneumonia, improved but not completely resolved. No new abnormalities. Continued follow-up radiography is recommended to confirm complete clearance of the right upper lobe abnormality. Electronically Signed   By: Andreas Newport M.D.   On: 02/15/2016 22:58   Ct Angio Chest Pe W/cm &/or Wo Cm  Result Date: 02/15/2016 CLINICAL DATA:  45 year old male With sudden onset left-sided chest pain and shortness of breath EXAM: CT ANGIOGRAPHY CHEST WITH CONTRAST TECHNIQUE: Multidetector CT imaging of the chest was performed using the standard protocol during bolus administration of intravenous contrast. Multiplanar CT image reconstructions and MIPs were obtained to evaluate the vascular anatomy. CONTRAST:  100 cc Isovue 370 COMPARISON:  Chest radiograph dated 02/15/2016 FINDINGS: There is a focal area of pleural based consolidative change with air bronchograms in the  posterior segment of the right upper lobe most likely pneumonia. Underlying mass is less likely given sudden onset a rapid progression but not excluded. Follow-up to resolution is recommended. There is diffuse interstitial prominence and edema. There is diffuse mosaic appearance of the lungs with areas of air trapping likely related to underlying small vessel versus small airway disease. Small right pleural effusion. There is no pneumothorax. The central airways are patent. The thoracic aorta appears unremarkable. No CT evidence of pulmonary embolism. Top-normal cardiac size. No pericardial effusion. There is coronary vascular calcification. Right hilar adenopathy. Multiple top-normal lymph nodes in the prevascular space. Right paratracheal  adenopathy measures 14 mm in short axis. The esophagus is grossly unremarkable. No thyroid nodules identified. There is no axillary adenopathy. The chest wall soft tissues appear unremarkable. There is degenerative changes of the spine. No acute fracture. A 1 cm sclerotic lesion in the right third rib likely represents a bone island. The visualized upper abdomen appear unremarkable. Review of the MIP images confirms the above findings. IMPRESSION: No CT evidence of pulmonary embolism. Focal consolidative changes of the right upper lobe most likely pneumonia. Clinical correlation and follow-up to resolution recommended. Mild interstitial edema and small right pleural effusion. Right hilar and mediastinal adenopathy as seen on the prior study. Clinical correlation is recommended. Electronically Signed   By: Anner Crete M.D.   On: 02/15/2016 23:59    EKG: Interpreted by me showed: NSR, 82 bpm, nonspecific st/t changes  Telemetry: Interpreted by me showed: NSR, no ventricular ectopy   Weights: Filed Weights   03/15/16 0124 03/15/16 0746  Weight: 253 lb (114.8 kg) 230 lb 12.8 oz (104.7 kg)     Physical Exam: Blood pressure (!) 149/96, pulse 72, temperature 98.9 F  (37.2 C), temperature source Oral, resp. rate 18, height 6' (1.829 m), weight 230 lb 12.8 oz (104.7 kg), SpO2 92 %. Body mass index is 31.3 kg/m. General: Well developed, well nourished, in no acute distress. Head: Normocephalic, atraumatic, sclera non-icteric, no xanthomas, nares are without discharge.  Neck: Negative for carotid bruits. JVD not elevated. Lungs: Decreased breath sounds bilaterally. Breathing is unlabored. Heart: RRR with S1 S2. No murmurs, rubs, or gallops appreciated. Abdomen: Soft, non-tender, non-distended with normoactive bowel sounds. No hepatomegaly. No rebound/guarding. No obvious abdominal masses. Msk:  Strength and tone appear normal for age. Extremities: No clubbing or cyanosis. No edema. Distal pedal pulses are 2+ and equal bilaterally. Neuro: Alert and oriented X 3. No facial asymmetry. No focal deficit. Moves all extremities spontaneously. Psych:  Responds to questions appropriately with a normal affect.    Assessment and Plan:  Principal Problem:   Acute respiratory distress (HCC) Active Problems:   Smoking   Acute on chronic combined systolic and diastolic CHF (congestive heart failure) (HCC)   COPD (chronic obstructive pulmonary disease) (HCC)   Sepsis (HCC)   HCAP (healthcare-associated pneumonia)   HTN (hypertension)   Coronary artery disease, non-occlusive   Hyperlipidemia    1. Acute respiratory distress: -Likely multifactorial including persistent PNA, acute on chronic combined CHF, and medication noncompliance  -Has been weaned off oxygen   2. Sepsis in the setting of persistent RUL HCAP/COPD exacerbation: -Consider cancer with post obstructive PNA? Given his smoking history and persistent infection -Per IM  3. Acute on chronic combined CHF: -IV Lasix with KCl repletion -No need to repeat echo given recent study on 02/29/16 -Medication compliance is advised -Consider getting social work involved   4. Chest pain: -Mostly  atypical -Recent cardiac CT without calcifications  -Mildly elevated troponin likely supply demand ischemia in the setting of the above -Continue to trend troponin -No indication for heparin gtt at this time given the above  5. Nonobstructive CAD: -Continue current medications -No plans for inpatient ischemic evaluation at this time  6. Hypertensive heart disease: -Per IM  7. Medication noncompliance: -Patient wants disability as he reports fatigue, though is not compliant with medications, and is able to exert himself with friends in a pool -Possible self fulfilling motives  7. Remaining per IM    Signed, Christell Faith, PA-C [facility] Pager: [phone] 03/15/2016, 9:45 AM

## 2016-03-15 NOTE — ED Notes (Addendum)
Pt presents to ER with chest pain that started around midnight - Pt c/o nausea but denies vomiting - Pain is radiating across the chest and is having numbness and tingling in left arm into fingers/hand - Pt states "it is hard to breath" - Respirations even and unlabored at this time - On 2L of 02 via n/c - pt has CHF and a hx of MI

## 2016-03-15 NOTE — ED Notes (Signed)
Patient with questions regarding his POC; MD made aware. This RN and Dr. Owens Shark return to bedside to speak with patient.

## 2016-03-15 NOTE — ED Notes (Signed)
Called pharmacy to send up vancomycin.

## 2016-03-15 NOTE — Progress Notes (Signed)
Follow-up Heart Failure Clinic appointment scheduled for March 26, 2016 at 9:30am. Of note, he's either cancelled or not shown for the last few appointments that have been made for him. Thank you.

## 2016-03-15 NOTE — ED Notes (Signed)
Called to room by Dr. Owens Shark to establish PIV access. Primary nurse at bedside; unsuccessful attempt to RAC already. This RN placed 20g PIV to [place] x 1 attempt without difficulties. Primary nurse and attending MD made aware.

## 2016-03-15 NOTE — ED Notes (Signed)
Patient with continued SOB and remains significantly HYPERtensive; pressure 168/122. PMH significant for systolic CHF; BNP obtained in triage; result 925. MD made aware of value and patient's current status. MD with VORB for Lasix 40mg  IVP; order to be entered and carried by this RN.

## 2016-03-15 NOTE — Care Management (Signed)
Medication management application given. 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 Admitted with CHF. Referral to CHF clinic made through portal.

## 2016-03-15 NOTE — H&P (Addendum)
Gabriel [name]. is an 45 y.o. male.   Chief Complaint: Chest pain   HPI: The patient with past medical history of congestive heart failure and hypertension presents to the emergency department complaining of chest pain that began at rest approximately one hour prior to presentation to the hospital. He took 4 baby aspirin but this did not relieve his pain. He recalls feeling very nauseous and chilled but denies diaphoresis or emesis. He describes the pain in a band across his chest bilaterally. It does not radiate. Chest x-ray in the emergency department revealed persistent pneumonia. He had been admitted to the hospital one month ago and reports completing a course of Augmentin. Laboratory evaluation today reveals leukocytosis and elevated troponin. The patient was tachypneic and mildly hypoxic in the emergency department. Due to ongoing chest pain and sepsis the emergency department staff called for admission.  Past Medical History:  Diagnosis Date  . Cardiomyopathy ([place])   . CHF (congestive heart failure) ([place])   . COPD (chronic obstructive pulmonary disease) ([place])   . Diabetes mellitus without complication ([place])    Pre diabetic  . Hypertension    Resolved since weight loss  . Obesity (BMI 30.0-34.9)   . Psoriasis   . Systolic CHF [facility])     Past Surgical History:  Procedure Laterality Date  . AMPUTATION    . FINGER AMPUTATION     Traumatic    Family History  Problem Relation Age of Onset  . Diabetes Mellitus II Mother   . Hypertension Father   . Cancer Paternal Aunt   . Cancer Maternal Grandfather    Social History:  reports that he has been smoking Cigarettes.  He has been smoking about 0.00 packs per day for the past 33.00 years. He has never used smokeless tobacco. He reports that he drinks about 1.8 oz of alcohol per week . He reports that he does not use drugs.  Allergies:  Allergies  Allergen Reactions  . Prednisone Other (See Comments)    Reaction: Hallucinations      Prior to Admission medications   Medication Sig Start Date End Date Taking? Authorizing Provider  amiodarone (PACERONE) 200 MG tablet Take 1 tablet (200 mg total) by mouth 2 (two) times daily. 02/08/16  Yes Minna Merritts, MD  amLODipine (NORVASC) 10 MG tablet Take 1 tablet (10 mg total) by mouth daily. 03/08/16  Yes Minna Merritts, MD  aspirin 81 MG chewable tablet Chew 81 mg by mouth daily. Reported on 02/08/2016   Yes Historical Provider, MD  furosemide (LASIX) 40 MG tablet Take 1 tablet (40 mg total) by mouth every other day. 02/08/16 02/07/17 Yes Minna Merritts, MD  lisinopril (PRINIVIL,ZESTRIL) 20 MG tablet Take 1 tablet (20 mg total) by mouth daily. 03/08/16  Yes Minna Merritts, MD  metoprolol succinate (TOPROL-XL) 25 MG 24 hr tablet Take 1 tablet (25 mg total) by mouth daily. Take with or immediately following a meal. 03/08/16  Yes Minna Merritts, MD     Results for orders placed or performed during the hospital encounter of 03/15/16 (from the past 48 hour(s))  Basic metabolic panel     Status: Abnormal   Collection Time: 03/15/16  1:23 AM  Result Value Ref Range   Sodium 141 135 - 145 mmol/L   Potassium 3.6 3.5 - 5.1 mmol/L   Chloride 109 101 - 111 mmol/L   CO2 23 22 - 32 mmol/L   Glucose, Bld 105 (H) 65 - 99 mg/dL  BUN 18 6 - 20 mg/dL   Creatinine, Ser 1.03 0.61 - 1.24 mg/dL   Calcium 9.4 8.9 - 10.3 mg/dL   GFR calc non Af Amer >60 >60 mL/min   GFR calc Af Amer >60 >60 mL/min    Comment: (NOTE) The eGFR has been calculated using the CKD EPI equation. This calculation has not been validated in all clinical situations. eGFR's persistently <60 mL/min signify possible Chronic Kidney Disease.    Anion gap 9 5 - 15  CBC     Status: Abnormal   Collection Time: 03/15/16  1:23 AM  Result Value Ref Range   WBC 13.6 (H) 3.8 - 10.6 K/uL   RBC 5.54 4.40 - 5.90 MIL/uL   Hemoglobin 15.2 13.0 - 18.0 g/dL   HCT 45.1 40.0 - 52.0 %   MCV 81.5 80.0 - 100.0 fL   MCH 27.5 26.0 -  34.0 pg   MCHC 33.7 32.0 - 36.0 g/dL   RDW 16.7 (H) 11.5 - 14.5 %   Platelets 195 150 - 440 K/uL  Troponin I     Status: Abnormal   Collection Time: 03/15/16  1:23 AM  Result Value Ref Range   Troponin I 0.07 (HH) <0.03 ng/mL    Comment: CRITICAL RESULT CALLED TO, READ BACK BY AND VERIFIED WITH LISA THOMPSON ON 03/15/16 AT 0208 BY TLB   Brain natriuretic peptide     Status: Abnormal   Collection Time: 03/15/16  1:23 AM  Result Value Ref Range   B Natriuretic Peptide 925.0 (H) 0.0 - 100.0 pg/mL   Dg Chest 2 View  Result Date: 03/15/2016 CLINICAL DATA:  45 year old male with left-sided chest pain and shortness of breath EXAM: CHEST  2 VIEW COMPARISON:  Chest CT dated 02/15/2016 FINDINGS: There is a focal area of pleural-based opacity in the right upper lobe corresponding to the area of consolidation seen on the prior CT. There has been minimal interval improvement compared to the radiograph dated 02/15/2016. Is a small right pleural effusion. There is diffuse interstitial prominence and edema. Mild cardiomegaly. No acute osseous pathology. IMPRESSION: Persistent focal opacity in the right upper lobe, minimally improved since radiograph dated 02/15/2016. Small right pleural effusion, new from radiographs dated 02/15/2016. Follow-up to resolution recommended. Electronically Signed   By: Anner Crete M.D.   On: 03/15/2016 01:44   Review of Systems  Constitutional: Positive for chills. Negative for fever.  HENT: Negative for sore throat and tinnitus.   Eyes: Negative for blurred vision and redness.  Respiratory: Positive for shortness of breath. Negative for cough.   Cardiovascular: Positive for chest pain. Negative for palpitations, orthopnea and PND.  Gastrointestinal: Positive for nausea. Negative for abdominal pain, diarrhea and vomiting.  Genitourinary: Negative for dysuria, frequency and urgency.  Musculoskeletal: Negative for joint pain and myalgias.  Skin: Negative for rash.        No lesions  Neurological: Negative for speech change, focal weakness and weakness.  Endo/Heme/Allergies: Environmental allergies: .  Does not bruise/bleed easily.       No temperature intolerance  Psychiatric/Behavioral: Negative for depression and suicidal ideas.    Blood pressure (!) 168/114, pulse 71, temperature 98.3 F (36.8 C), temperature source Oral, resp. rate 18, height 6' (1.829 m), weight 114.8 kg (253 lb), SpO2 94 %. Physical Exam  Nursing note and vitals reviewed. Constitutional: He is oriented to person, place, and time. He appears well-developed and well-nourished. No distress.  HENT:  Head: Normocephalic and atraumatic.  Mouth/Throat: Oropharynx is clear  and moist.  Eyes: Conjunctivae and EOM are normal. Pupils are equal, round, and reactive to light. No scleral icterus.  Neck: Normal range of motion. Neck supple. No JVD present. No tracheal deviation present. No thyromegaly present.  Cardiovascular: Normal rate and regular rhythm.  Exam reveals no gallop and no friction rub.   No murmur heard. Respiratory: Breath sounds normal. Tachypnea noted. No respiratory distress. He has no wheezes.  Nasal cannula in place  GI: Soft. Bowel sounds are normal. He exhibits no distension. There is no tenderness.  Genitourinary:  Genitourinary Comments: Deferred  Musculoskeletal: Normal range of motion. He exhibits no edema.  Lymphadenopathy:    He has no cervical adenopathy.  Neurological: He is alert and oriented to person, place, and time. No cranial nerve deficit.  Skin: Skin is warm and dry. No rash noted. No erythema.  Psychiatric: He has a normal mood and affect. His behavior is normal. Judgment and thought content normal.     Assessment/Plan This is a 45 year old male admitted for sepsis and chest pain. 1. Sepsis: The patient meets criteria for sepsis via tachypnea and leukocytosis. He has received vancomycin and Levaquin in the emergency department. Well follow blood  cultures for growth and sensitivities. The patient is hemodynamically stable. Normal saline bolus of 30 mL/kg deferred due to severe cardiomyopathy. Source is likely pneumonia. 2. Pneumonia: Recurrent. Unclear why the patient cannot clear infiltrates. Consult pulmonology per patient request. 3. Chest pain: atypical for cardiac etiology but must rule out myocardial ischemia. Initial troponin is mildly elevated. This may be due to to demand ischemia secondary to sepsis. Differential diagnosis also includes pneumonia as radiology interpretation reports that infiltrate is peri-pleural. Consult cardiology.  4. Essential hypertension: Uncontrolled. Continue lisinopril and amlodipine  5. Systolic heart failure: Chronic; no evidence of pulmonary edema or vascular congestion. Continue furosemide 6. History of ventricular tachycardia: Continue amiodarone. Patient is currently in normal sinus rhythm. 7. DVT prophylaxis: Heparin 8. GI prophylaxis: None The patient is a full code. Time spent on admission orders and patient care approximately 45 minutes  Harrie Foreman, MD 03/15/2016, 6:32 AM

## 2016-03-15 NOTE — Consult Note (Signed)
Pharmacy Antibiotic Note  Gabriel [name]. is a 45 y.o. male admitted on 03/15/2016 with pneumonia.  Pharmacy has been consulted for levoflxoacin and vancomycin dosing.  Plan: Patient to received vancomycin 1250mg  once. Will give next dose in 6 hours for stacked dosing. Vancomycin 1000mg  IV every 8 hours.  Goal trough 15-20 mcg/mL. levofloxacin 750mg  q 24 hours  Trough at steady state tomorrow at 1530. Pt is at risk for accumulation due to size. Monitor closely.  Height: 6' (182.9 cm) Weight: 230 lb 12.8 oz (104.7 kg) IBW/kg (Calculated) : 77.6  Temp (24hrs), Avg:98.6 F (37 C), Min:98.3 F (36.8 C), Max:98.9 F (37.2 C)   Recent Labs Lab 03/15/16 0123  WBC 13.6*  CREATININE 1.03    Estimated Creatinine Clearance: 110.9 mL/min (by C-G formula based on SCr of 1.03 mg/dL).    Allergies  Allergen Reactions  . Prednisone Other (See Comments)    Reaction: Hallucinations     Antimicrobials this admission: levofloxacin 7/28 >>  vancomycin 7/28 >>   Dose adjustments this admission:   Microbiology results: 7/28 BCx:    Thank you for allowing pharmacy to be a part of this patient's care.  Ramond Dial, Pharm.D Clinical Pharmacist  03/15/2016 8:56 AM

## 2016-03-15 NOTE — Progress Notes (Signed)
[place] [facility] [place] NAME: Gabriel [name]    MR#:  [id number]  DATE OF BIRTH:  1971-03-29  SUBJECTIVE:  CHIEF COMPLAINT:   Chief Complaint  Patient presents with  . Chest Pain  still continues to c/o chest pain and sob. REVIEW OF SYSTEMS:  Review of Systems  Constitutional: Negative for chills, fever and weight loss.  HENT: Negative for nosebleeds and sore throat.   Eyes: Negative for blurred vision.  Respiratory: Positive for shortness of breath. Negative for cough and wheezing.   Cardiovascular: Positive for chest pain. Negative for orthopnea, leg swelling and PND.  Gastrointestinal: Negative for abdominal pain, constipation, diarrhea, heartburn, nausea and vomiting.  Genitourinary: Negative for dysuria and urgency.  Musculoskeletal: Negative for back pain.  Skin: Negative for rash.  Neurological: Negative for dizziness, speech change, focal weakness and headaches.  Endo/Heme/Allergies: Does not bruise/bleed easily.  Psychiatric/Behavioral: Negative for depression.    DRUG ALLERGIES:   Allergies  Allergen Reactions  . Prednisone Other (See Comments)    Reaction: Hallucinations    VITALS:  Blood pressure (!) 140/97, pulse 71, temperature 98.5 F (36.9 C), temperature source Oral, resp. rate 17, height 6' (1.829 m), weight 104.7 kg (230 lb 12.8 oz), SpO2 95 %. PHYSICAL EXAMINATION:  Physical Exam  Constitutional: He is oriented to person, place, and time and well-developed, well-nourished, and in no distress.  HENT:  Head: Normocephalic and atraumatic.  Eyes: Conjunctivae and EOM are normal. Pupils are equal, round, and reactive to light.  Neck: Normal range of motion. Neck supple. No tracheal deviation present. No thyromegaly present.  Cardiovascular: Normal rate, regular rhythm and normal heart sounds.   Pulmonary/Chest: Effort normal and breath sounds normal. No respiratory distress. He has no wheezes. He exhibits no  tenderness.  Abdominal: Soft. Bowel sounds are normal. He exhibits no distension. There is no tenderness.  Musculoskeletal: Normal range of motion.  Neurological: He is alert and oriented to person, place, and time. No cranial nerve deficit.  Skin: Skin is warm and dry. No rash noted.  Psychiatric: Mood and affect normal.   LABORATORY PANEL:   CBC  Recent Labs Lab 03/15/16 0123  WBC 13.6*  HGB 15.2  HCT 45.1  PLT 195   ------------------------------------------------------------------------------------------------------------------ Chemistries   Recent Labs Lab 03/15/16 0123  NA 141  K 3.6  CL 109  CO2 23  GLUCOSE 105*  BUN 18  CREATININE 1.03  CALCIUM 9.4   RADIOLOGY:  Dg Chest 2 View  Result Date: 03/15/2016 CLINICAL DATA:  45 year old male with left-sided chest pain and shortness of breath EXAM: CHEST  2 VIEW COMPARISON:  Chest CT dated 02/15/2016 FINDINGS: There is a focal area of pleural-based opacity in the right upper lobe corresponding to the area of consolidation seen on the prior CT. There has been minimal interval improvement compared to the radiograph dated 02/15/2016. Is a small right pleural effusion. There is diffuse interstitial prominence and edema. Mild cardiomegaly. No acute osseous pathology. IMPRESSION: Persistent focal opacity in the right upper lobe, minimally improved since radiograph dated 02/15/2016. Small right pleural effusion, new from radiographs dated 02/15/2016. Follow-up to resolution recommended. Electronically Signed   By: Anner Crete M.D.   On: 03/15/2016 01:44  ASSESSMENT AND PLAN:  This is a 45 year old male admitted for sepsis and chest pain.  1. Sepsis: present on admission - Source is likely pneumonia. - levaquin + vanco for now  2. Pneumonia: Recurrent. Unclear why the patient cannot clear  infiltrates (? Cancer). Consult pulmonology   3. Chest pain: atypical for cardiac etiology, can be due to to demand ischemia secondary  to sepsis. Appreciate cardiology input. He is noncompliant with meds  4. Essential hypertension: Uncontrolled. Continue lisinopril and amlodipine   5. Acute on chronic systolic heart failure: Chronic; no evidence of pulmonary edema or vascular congestion. Continue furosemide for now - cardio seen  6. History of ventricular tachycardia: Continue amiodarone. Patient is currently in normal sinus rhythm.  7. DVT prophylaxis: Heparin 8. GI prophylaxis: None     All the records are reviewed and case discussed with Care Management/Social Worker. Management plans discussed with the patient, family and they are in agreement.  CODE STATUS: FULL CODE  TOTAL TIME TAKING CARE OF THIS PATIENT: 35 minutes.   More than 50% of the time was spent in counseling/coordination of care: YES  POSSIBLE D/C IN 1-2 DAYS, DEPENDING ON CLINICAL CONDITION.   [facility], Kadien Lineman M.D on 03/15/2016 at 1:47 PM  Between 7am to 6pm - Pager - [phone]  After 6pm go to [email or web address] - Technical brewer [place] [facility]  Office  [phone]  CC: Primary care physician; No PCP Per Patient  Note: This dictation was prepared with Dragon dictation along with smaller phrase technology. Any transcriptional errors that result from this process are unintentional.

## 2016-03-15 NOTE — Progress Notes (Signed)
Patient admitted from home, states he lives with his dad. Complain of chest pain , troponin slightly elevated. Cardiology consulted. Patient receiving PRN pain medications with relief. Vital signs stable, NSR on tele. Receiving IV antibiotics for pneumonia. Will continue to monitor.

## 2016-03-15 NOTE — ED Notes (Signed)
Dr Diamond at bedside. 

## 2016-03-16 LAB — CBC
HCT: 44.1 % (ref 40.0–52.0)
Hemoglobin: 15.1 g/dL (ref 13.0–18.0)
MCH: 27.9 pg (ref 26.0–34.0)
MCHC: 34.3 g/dL (ref 32.0–36.0)
MCV: 81.4 fL (ref 80.0–100.0)
Platelets: 183 10*3/uL (ref 150–440)
RBC: 5.42 MIL/uL (ref 4.40–5.90)
RDW: 17.2 % — ABNORMAL HIGH (ref 11.5–14.5)
WBC: 10.8 10*3/uL — ABNORMAL HIGH (ref 3.8–10.6)

## 2016-03-16 LAB — BASIC METABOLIC PANEL
Anion gap: 9 (ref 5–15)
BUN: 30 mg/dL — ABNORMAL HIGH (ref 6–20)
CO2: 28 mmol/L (ref 22–32)
Calcium: 9.2 mg/dL (ref 8.9–10.3)
Chloride: 103 mmol/L (ref 101–111)
Creatinine, Ser: 1.44 mg/dL — ABNORMAL HIGH (ref 0.61–1.24)
GFR calc Af Amer: >60 mL/min (ref >60)
GFR calc non Af Amer: 57 mL/min — ABNORMAL LOW (ref >60)
Glucose, Bld: 94 mg/dL (ref 65–99)
Potassium: 3.9 mmol/L (ref 3.5–5.1)
Sodium: 140 mmol/L (ref 135–145)

## 2016-03-16 MED ORDER — HYDROCODONE-ACETAMINOPHEN 5-325 MG PO TABS: 1.0000 | ORAL_TABLET | Freq: Four times a day (QID) | ORAL | 0 refills | Status: DC | PRN

## 2016-03-16 MED ORDER — LEVOFLOXACIN 500 MG PO TABS: 500.0000 mg | ORAL_TABLET | Freq: Every day | ORAL | 0 refills | Status: DC

## 2016-03-16 NOTE — Discharge Instructions (Signed)
Heart Failure Clinic appointment on March 26, 2016 at 9:30am with [name], [place]. Please call [phone] to reschedule.   Resume prior activity and diet

## 2016-03-16 NOTE — Care Management Note (Signed)
Case Management Note  Patient Details  Name: Gabriel [name]. MRN: [id number] Date of Birth: [birth year]  Subjective/Objective:     Discharge to home with a GoodRx Coupon for Levofloxin. [name] was given information for [facility] on February 17 2016. Was provided with another copy of the list of local clinics which serve the uninsured.               Action/Plan:   Expected Discharge Date:                  Expected Discharge Plan:     In-House Referral:     Discharge planning Services     Post Acute Care Choice:    Choice offered to:     DME Arranged:    DME Agency:     HH Arranged:    HH Agency:     Status of Service:     If discussed at [employer] of Stay Meetings, dates discussed:    Additional Comments:  Gray Doering A, RN 03/16/2016, 9:34 AM

## 2016-03-16 NOTE — Progress Notes (Addendum)
Name: Gabriel [name]. MRN: [id number] DOB: April 03, 1971    ADMISSION DATE:  03/15/2016   BRIEF PATIENT DESCRIPTION: This is a 45 yo male who presented to [facility] ER on 7/28 with c/o chest pain with left arm numbness that began at rest approximately 1 hr prior to presentation to the hospital.  PCCM consulted 7/28 due to recurrent Pneumonia and COPD.   SIGNIFICANT EVENTS  7/28-Pt admitted to [facility] due to Pneumonia, Sepsis, and Chest Pain   STUDIES:  Urine Drug Screen 7/28>>positive for opioids    Prior to Admission medications   Medication Sig Start Date End Date Taking? Authorizing Provider  amiodarone (PACERONE) 200 MG tablet Take 1 tablet (200 mg total) by mouth 2 (two) times daily. 02/08/16  Yes Minna Merritts, MD  amLODipine (NORVASC) 10 MG tablet Take 1 tablet (10 mg total) by mouth daily. 03/08/16  Yes Minna Merritts, MD  aspirin 81 MG chewable tablet Chew 81 mg by mouth daily. Reported on 02/08/2016   Yes Historical Provider, MD  furosemide (LASIX) 40 MG tablet Take 1 tablet (40 mg total) by mouth every other day. 02/08/16 02/07/17 Yes Minna Merritts, MD  lisinopril (PRINIVIL,ZESTRIL) 20 MG tablet Take 1 tablet (20 mg total) by mouth daily. 03/08/16  Yes Minna Merritts, MD  metoprolol succinate (TOPROL-XL) 25 MG 24 hr tablet Take 1 tablet (25 mg total) by mouth daily. Take with or immediately following a meal. 03/08/16  Yes Minna Merritts, MD   Allergies  Allergen Reactions  . Prednisone Other (See Comments)    Reaction: Hallucinations     REVIEW OF SYSTEMS:  Positives in BOLD Constitutional:  fever, chills, weight loss, malaise/fatigue and diaphoresis.  HENT: Negative for hearing loss, ear pain, nosebleeds, congestion, sore throat, neck pain, tinnitus and ear discharge.   Eyes: Negative for blurred vision, double vision, photophobia, pain, discharge and redness.  Respiratory: cough, hemoptysis, sputum production, shortness of breath, wheezing and stridor.     Cardiovascular: chest pain, palpitations, orthopnea, claudication, leg swelling and PND.  Gastrointestinal:heartburn, nausea, vomiting, abdominal pain, diarrhea, constipation, blood in stool and melena.  Genitourinary: Negative for dysuria, urgency, frequency, hematuria and flank pain.  Musculoskeletal: Negative for myalgias, back pain, joint pain and falls.  Skin: Negative for itching and rash.  Neurological: left arm numbness, dizziness, tingling, tremors, sensory change, speech change, focal weakness, seizures, loss of consciousness, weakness and headaches.  Endo/Heme/Allergies: Negative for environmental allergies and polydipsia. Does not bruise/bleed easily.  SUBJECTIVE:  Pt states he feels better no acute distress.  Had 1 episode of chest the pain morning resolved spontaneously  VITAL SIGNS: Temp:  [98 F (36.7 C)-98.5 F (36.9 C)] 98 F (36.7 C) (07/29 0450) Pulse Rate:  [62-77] 65 (07/29 0450) Resp:  [17-18] 18 (07/29 0450) BP: (118-147)/(68-98) 118/80 (07/29 0450) SpO2:  [94 %-98 %] 94 % (07/29 0450) Weight:  [230 lb 12.8 oz (104.7 kg)-232 lb (105.2 kg)] 232 lb (105.2 kg) (07/29 0450)  PHYSICAL EXAMINATION: General:  Well developed, well nourished Neuro:  Alert and oriented, follows commands HEENT:  Supple, no JVD Cardiovascular:  s1s2, rrr, no M/R/G Lungs:  Clear throughout, even, non labored Abdomen:  +BSx4, soft, non tender, non distended Musculoskeletal:  Normal bulk and tone Skin:  intact   Recent Labs Lab 03/15/16 0123 03/16/16 0605  NA 141 140  K 3.6 3.9  CL 109 103  CO2 23 28  BUN 18 30*  CREATININE 1.03 1.44*  GLUCOSE 105* 94  Recent Labs Lab 03/15/16 0123 03/16/16 0605  HGB 15.2 15.1  HCT 45.1 44.1  WBC 13.6* 10.8*  PLT 195 183   Dg Chest 2 View  Result Date: 03/15/2016 CLINICAL DATA:  45 year old male with left-sided chest pain and shortness of breath EXAM: CHEST  2 VIEW COMPARISON:  Chest CT dated 02/15/2016 FINDINGS: There is a focal  area of pleural-based opacity in the right upper lobe corresponding to the area of consolidation seen on the prior CT. There has been minimal interval improvement compared to the radiograph dated 02/15/2016. Is a small right pleural effusion. There is diffuse interstitial prominence and edema. Mild cardiomegaly. No acute osseous pathology. IMPRESSION: Persistent focal opacity in the right upper lobe, minimally improved since radiograph dated 02/15/2016. Small right pleural effusion, new from radiographs dated 02/15/2016. Follow-up to resolution recommended. Electronically Signed   By: Anner Crete M.D.   On: 03/15/2016 01:44   ASSESSMENT / PLAN: Acute Respiratory Failure Secondary likely secondary to Pneumonia  ?COPD  Tobacco Abuse  Review of patient's CXR images, the right lung infiltrate appears to be signfiicantly improved, given its previous density, it appears to be taking a prolonged but expected course.   Plan Continue Levaquin MRSA PCR negative, no need for vanco.  Will need outpatient pulmonology follow-up to establish care and perform pulmonary function test.  Pt would like to see Dr. Raul Del Repeat CXR in 4-6 weeks to ensure that this is resolving.   Incentive Spirometry Agree with discharge home from respiratory standpoint  Marda Stalker, [place] Pager [phone] (please enter 7 digits) PCCM Consult Pager [phone] (please enter 7 digits)  Pt seen and examined, agree with NP assessment and plan.   Pneumonia with persistent but improving radiograph, will need outpatient follow up with Dr. Raul Del. Pulmonary service will sign off for now, please call if there are any further questions or concerns.   --Marda Stalker, M.D.

## 2016-03-18 ENCOUNTER — Encounter: Payer: Self-pay | Admitting: Cardiovascular Disease

## 2016-03-18 NOTE — Telephone Encounter (Signed)
Pt is calling to check the status of his work note. Please call when ready

## 2016-03-18 NOTE — Discharge Summary (Signed)
[facility] NAME: Gabriel [name]    MR#:  [id number]  DATE OF BIRTH:  1971/06/11  DATE OF ADMISSION:  03/15/2016 ADMITTING PHYSICIAN: Harrie Foreman, MD  DATE OF DISCHARGE: 03/16/2016 11:55 AM  PRIMARY CARE PHYSICIAN: No PCP Per Patient   ADMISSION DIAGNOSIS:  Chest Pains  DISCHARGE DIAGNOSIS:  Principal Problem:   Acute respiratory distress (HCC) Active Problems:   HTN (hypertension)   Smoking   Acute on chronic combined systolic and diastolic CHF (congestive heart failure) (HCC)   Hyperlipidemia   COPD (chronic obstructive pulmonary disease) (HCC)   Sepsis (HCC)   HCAP (healthcare-associated pneumonia)   Coronary artery disease, non-occlusive   SECONDARY DIAGNOSIS:   Past Medical History:  Diagnosis Date  . Cardiomyopathy ([place])   . CHF (congestive heart failure) ([place])   . COPD (chronic obstructive pulmonary disease) ([place])   . Diabetes mellitus without complication ([place])    Pre diabetic  . Hypertension    Resolved since weight loss  . Obesity (BMI 30.0-34.9)   . Psoriasis   . Systolic CHF [facility])      ADMITTING HISTORY  Chief Complaint: Chest pain   HPI: The patient with past medical history of congestive heart failure and hypertension presents to the emergency department complaining of chest pain that began at rest approximately one hour prior to presentation to the hospital. He took 4 baby aspirin but this did not relieve his pain. He recalls feeling very nauseous and chilled but denies diaphoresis or emesis. He describes the pain in a band across his chest bilaterally. It does not radiate. Chest x-ray in the emergency department revealed persistent pneumonia. He had been admitted to the hospital one month ago and reports completing a course of Augmentin. Laboratory evaluation today reveals leukocytosis and elevated troponin. The patient was tachypneic and mildly hypoxic in the emergency department. Due to  ongoing chest pain and sepsis the emergency department staff called for admission.  HOSPITAL COURSE:   This is a 45 year old male admitted for sepsis and chest pain.  1. Sepsis: present on admission Due to pneumonia - levaquin + vanco Started. Pulmonary has seen the patient. Due to MRSA PCR negative vancomycin was stopped. Patient is being discharged home on oral Levaquin to follow-up with pulmonary.  2. Pneumonia:   3. Chest pain: atypical for cardiac etiology, can be due to to demand ischemia secondary to sepsis. Appreciate cardiology input. He is noncompliant with meds  4. Essential hypertension: Uncontrolled. Continue lisinopril and amlodipine   5. Acute on chronic systolic heart failure  no evidence of pulmonary edema or vascularcongestion. Continue furosemide for now - cardiology has seen patient in hospital  6. History of ventricular tachycardia: Continue amiodarone. Patient is currently in normal sinus rhythm.   Patient is stable for discharge home. He is afebrile on day of discharge and lungs sound clear.  CONSULTS OBTAINED:  Treatment Team:  Minna Merritts, MD Laverle Hobby, MD  DRUG ALLERGIES:   Allergies  Allergen Reactions  . Prednisone Other (See Comments)    Reaction: Hallucinations     DISCHARGE MEDICATIONS:   Discharge Medication List as of 03/16/2016 10:36 AM    START taking these medications   Details  HYDROcodone-acetaminophen (NORCO/VICODIN) 5-325 MG tablet Take 1 tablet by mouth every 6 (six) hours as needed for severe pain., Starting Sat 03/16/2016, Print    levofloxacin (LEVAQUIN) 500 MG tablet Take 1 tablet (500 mg total) by mouth daily., Starting Sat  03/16/2016, Normal      CONTINUE these medications which have NOT CHANGED   Details  amiodarone (PACERONE) 200 MG tablet Take 1 tablet (200 mg total) by mouth 2 (two) times daily., Starting Thu 02/08/2016, Normal    amLODipine (NORVASC) 10 MG tablet Take 1 tablet (10 mg total) by  mouth daily., Starting Fri 03/08/2016, Normal    aspirin 81 MG chewable tablet Chew 81 mg by mouth daily. Reported on 02/08/2016, Historical Med    furosemide (LASIX) 40 MG tablet Take 1 tablet (40 mg total) by mouth every other day., Starting Thu 02/08/2016, Until Fri 02/07/2017, Normal    lisinopril (PRINIVIL,ZESTRIL) 20 MG tablet Take 1 tablet (20 mg total) by mouth daily., Starting Fri 03/08/2016, Normal    metoprolol succinate (TOPROL-XL) 25 MG 24 hr tablet Take 1 tablet (25 mg total) by mouth daily. Take with or immediately following a meal., Starting Fri 03/08/2016, Normal        Today   VITAL SIGNS:  Blood pressure 135/81, pulse 65, temperature 98.2 F (36.8 C), temperature source Oral, resp. rate 19, height 6' (1.829 m), weight 105.2 kg (232 lb), SpO2 94 %.  I/O:  No intake or output data in the 24 hours ending 03/18/16 1218  PHYSICAL EXAMINATION:  Physical Exam  GENERAL:  45 y.o.-year-old patient lying in the bed with no acute distress.  LUNGS: Normal breath sounds bilaterally, no wheezing, rales,rhonchi or crepitation. No use of accessory muscles of respiration.  CARDIOVASCULAR: S1, S2 normal. No murmurs, rubs, or gallops.  ABDOMEN: Soft, non-tender, non-distended. Bowel sounds present. No organomegaly or mass.  NEUROLOGIC: Moves all 4 extremities. PSYCHIATRIC: The patient is alert and oriented x 3.  SKIN: No obvious rash, lesion, or ulcer.   DATA REVIEW:   CBC  Recent Labs Lab 03/16/16 0605  WBC 10.8*  HGB 15.1  HCT 44.1  PLT 183    Chemistries   Recent Labs Lab 03/16/16 0605  NA 140  K 3.9  CL 103  CO2 28  GLUCOSE 94  BUN 30*  CREATININE 1.44*  CALCIUM 9.2    Cardiac Enzymes  Recent Labs Lab 03/15/16 2007  TROPONINI 0.05*    Microbiology Results  Results for orders placed or performed during the hospital encounter of 03/15/16  CULTURE, BLOOD (ROUTINE X 2) w Reflex to ID Panel     Status: None (Preliminary result)   Collection Time:  03/15/16  5:03 AM  Result Value Ref Range Status   Specimen Description BLOOD LEFT HAND  Final   Special Requests   Final    BOTTLES DRAWN AEROBIC AND ANAEROBIC AER 6ML ANA 10ML   Culture NO GROWTH 2 DAYS  Final   Report Status PENDING  Incomplete  CULTURE, BLOOD (ROUTINE X 2) w Reflex to ID Panel     Status: None (Preliminary result)   Collection Time: 03/15/16  5:03 AM  Result Value Ref Range Status   Specimen Description BLOOD LEFT AC  Final   Special Requests   Final    BOTTLES DRAWN AEROBIC AND ANAEROBIC AER 14ML ANA 6ML   Culture NO GROWTH 2 DAYS  Final   Report Status PENDING  Incomplete  MRSA PCR Screening     Status: None   Collection Time: 03/15/16 12:27 PM  Result Value Ref Range Status   MRSA by PCR NEGATIVE NEGATIVE Final    Comment:        The GeneXpert MRSA Assay (FDA approved for NASAL specimens only), is one component of  a comprehensive MRSA colonization surveillance program. It is not intended to diagnose MRSA infection nor to guide or monitor treatment for MRSA infections.     RADIOLOGY:  No results found.  Follow up with PCP in 1 week.  Management plans discussed with the patient, family and they are in agreement.  CODE STATUS:  Code Status History    Date Active Date Inactive Code Status Order ID Comments User Context   03/15/2016  7:58 AM 03/16/2016  3:08 PM Full Code [id number]  Harrie Foreman, MD Inpatient   02/16/2016  2:24 AM 02/17/2016  1:02 PM Full Code [id number]  Harrie Foreman, MD Inpatient   01/22/2016 11:51 PM 01/23/2016  4:41 PM Full Code [id number]  Demetrios Loll, MD ED   01/03/2016  2:22 PM 01/09/2016  2:54 PM Full Code [id number]  Lytle Butte, MD ED   05/17/2015 11:41 PM 05/19/2015  5:31 PM Full Code [id number]  Lance Coon, MD Inpatient   03/20/2015  8:26 AM 03/21/2015  3:27 PM Full Code [id number]  Harrie Foreman, MD Inpatient      TOTAL TIME TAKING CARE OF THIS PATIENT ON DAY OF DISCHARGE: more than 30 minutes.   Hillary Bow R M.D on  03/18/2016 at 12:18 PM  Between 7am to 6pm - Pager - [phone]  After 6pm go to [email or web address] - password [email or web address]  [facility]  Office  [phone]  CC: Primary care physician; No PCP Per Patient  Note: This dictation was prepared with Dragon dictation along with smaller phrase technology. Any transcriptional errors that result from this process are unintentional.

## 2016-03-18 NOTE — Telephone Encounter (Signed)
Letter typed. Pt notified and will pick up at his convenience.

## 2016-03-20 LAB — CULTURE, BLOOD (ROUTINE X 2)
Culture: NO GROWTH
Culture: NO GROWTH

## 2016-03-26 ENCOUNTER — Ambulatory Visit: Payer: Self-pay | Admitting: Family

## 2016-04-01 ENCOUNTER — Telehealth: Payer: Self-pay | Admitting: Cardiovascular Disease

## 2016-04-01 NOTE — Telephone Encounter (Signed)
Received records request Disability Determination Services, forwarded to [employer] for processing.  

## 2016-05-16 NOTE — Telephone Encounter (Signed)
This encounter was created in error - please disregard.

## 2016-05-23 ENCOUNTER — Emergency Department
Admission: EM | Admit: 2016-05-23 | Discharge: 2016-05-23 | Disposition: A | Discharge status: Home / Self Care | Payer: Self-pay | Attending: Emergency Medicine | Admitting: Emergency Medicine

## 2016-05-23 ENCOUNTER — Encounter: Payer: Self-pay | Admitting: Emergency Medicine

## 2016-05-23 ENCOUNTER — Emergency Department: Payer: Self-pay

## 2016-05-23 DIAGNOSIS — Z79899 Other long term (current) drug therapy: Secondary | ICD-10-CM | POA: Insufficient documentation

## 2016-05-23 DIAGNOSIS — F17210 Nicotine dependence, cigarettes, uncomplicated: Secondary | ICD-10-CM | POA: Insufficient documentation

## 2016-05-23 DIAGNOSIS — X501XXA Overexertion from prolonged static or awkward postures, initial encounter: Secondary | ICD-10-CM | POA: Insufficient documentation

## 2016-05-23 DIAGNOSIS — I10 Essential (primary) hypertension: Secondary | ICD-10-CM

## 2016-05-23 DIAGNOSIS — Y999 Unspecified external cause status: Secondary | ICD-10-CM | POA: Insufficient documentation

## 2016-05-23 DIAGNOSIS — I5043 Acute on chronic combined systolic (congestive) and diastolic (congestive) heart failure: Secondary | ICD-10-CM | POA: Insufficient documentation

## 2016-05-23 DIAGNOSIS — E119 Type 2 diabetes mellitus without complications: Secondary | ICD-10-CM | POA: Insufficient documentation

## 2016-05-23 DIAGNOSIS — I2510 Atherosclerotic heart disease of native coronary artery without angina pectoris: Secondary | ICD-10-CM | POA: Insufficient documentation

## 2016-05-23 DIAGNOSIS — Z7982 Long term (current) use of aspirin: Secondary | ICD-10-CM | POA: Insufficient documentation

## 2016-05-23 DIAGNOSIS — Y939 Activity, unspecified: Secondary | ICD-10-CM | POA: Insufficient documentation

## 2016-05-23 DIAGNOSIS — J449 Chronic obstructive pulmonary disease, unspecified: Secondary | ICD-10-CM | POA: Insufficient documentation

## 2016-05-23 DIAGNOSIS — Y929 Unspecified place or not applicable: Secondary | ICD-10-CM | POA: Insufficient documentation

## 2016-05-23 DIAGNOSIS — S93402A Sprain of unspecified ligament of left ankle, initial encounter: Secondary | ICD-10-CM | POA: Insufficient documentation

## 2016-05-23 DIAGNOSIS — I110 Hypertensive heart disease with heart failure: Secondary | ICD-10-CM | POA: Insufficient documentation

## 2016-05-23 DIAGNOSIS — Z791 Long term (current) use of non-steroidal anti-inflammatories (NSAID): Secondary | ICD-10-CM | POA: Insufficient documentation

## 2016-05-23 MED ORDER — IBUPROFEN 800 MG PO TABS: 800.0000 mg | ORAL_TABLET | Freq: Three times a day (TID) | ORAL | 0 refills | Status: DC

## 2016-05-23 MED ORDER — IBUPROFEN 800 MG PO TABS
800.0000 mg | ORAL_TABLET | Freq: Once | ORAL | Status: AC
  Administered 2016-05-23: 800 mg via ORAL
  Filled 2016-05-23: qty 1

## 2016-05-23 NOTE — Discharge Instructions (Signed)
Follow-up with Dr. Roland Rack if any continued problems with your ankle. Ibuprofen 800 mg 3 times a day with food. Ice and elevate when possible. Use crutches for walking until you're able to bear weight without pain. Wear splint for support and protection.

## 2016-05-23 NOTE — ED Provider Notes (Signed)
[facility] Emergency Department Provider Note   ____________________________________________   First MD Initiated Contact with Patient 05/23/16 [phone]     (approximate)  I have reviewed the triage vital signs and the nursing notes.   HISTORY  Chief Complaint Ankle Pain    HPI Gabriel [name]. is a 45 y.o. male is here with complaint of ankle pain. Patient states that yesterday he stepped in a hole twisting his left ankle. Patient states he has continued to have pain since that time. This morning it is more difficult for him to bear weight. Patient states he took ibuprofen 200 mg once and it did not help with his pain. Patient denies any previous injury to his ankle. Currently he rates his pain as a 10 over 10.   Past Medical History:  Diagnosis Date  . Cardiomyopathy ([place])   . CHF (congestive heart failure) ([place])   . COPD (chronic obstructive pulmonary disease) ([place])   . Diabetes mellitus without complication ([place])    Pre diabetic  . Hypertension    Resolved since weight loss  . Obesity (BMI 30.0-34.9)   . Psoriasis   . Systolic CHF [facility])     Patient Active Problem List   Diagnosis Date Noted  . HCAP (healthcare-associated pneumonia) 03/15/2016  . Coronary artery disease, non-occlusive 03/15/2016  . Acute respiratory distress 03/15/2016  . Chest pain 02/16/2016  . Orthostatic hypotension 01/23/2016  . Diarrhea 01/23/2016  . Hypokalemia 01/23/2016  . Hypomagnesemia 01/23/2016  . Chronic systolic CHF (congestive heart failure) ([place]) 01/23/2016  . Syncope 01/22/2016  . 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 NSVT (nonsustained ventricular tachycardia) ([place])   . 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 CAP (community acquired pneumonia)   . Arterial hypotension   . Elevated troponin   . Chronic systolic heart failure ([place])   . Sepsis ([place]) 01/03/2016  . Acute on chronic combined systolic and diastolic CHF (congestive  heart failure) ([place]) 05/17/2015  . Type II diabetes mellitus ([place]) 05/17/2015  . Hyperlipidemia 05/17/2015  . COPD (chronic obstructive pulmonary disease) ([place]) 05/17/2015  . HTN (hypertension) 03/31/2015  . Smoking 03/31/2015    Past Surgical History:  Procedure Laterality Date  . AMPUTATION    . FINGER AMPUTATION     Traumatic    Prior to Admission medications   Medication Sig Start Date End Date Taking? Authorizing Provider  amiodarone (PACERONE) 200 MG tablet Take 1 tablet (200 mg total) by mouth 2 (two) times daily. 02/08/16   Minna Merritts, MD  amLODipine (NORVASC) 10 MG tablet Take 1 tablet (10 mg total) by mouth daily. 03/08/16   Minna Merritts, MD  aspirin 81 MG chewable tablet Chew 81 mg by mouth daily. Reported on 02/08/2016    Historical Provider, MD  furosemide (LASIX) 40 MG tablet Take 1 tablet (40 mg total) by mouth every other day. 02/08/16 02/07/17  Minna Merritts, MD  HYDROcodone-acetaminophen (NORCO/VICODIN) 5-325 MG tablet Take 1 tablet by mouth every 6 (six) hours as needed for severe pain. 03/16/16   Hillary Bow, MD  ibuprofen (ADVIL,MOTRIN) 800 MG tablet Take 1 tablet (800 mg total) by mouth 3 (three) times daily. 05/23/16   Johnn Hai, PA-C  levofloxacin (LEVAQUIN) 500 MG tablet Take 1 tablet (500 mg total) by mouth daily. 03/16/16   Srikar Sudini, MD  lisinopril (PRINIVIL,ZESTRIL) 20 MG tablet Take 1 tablet (20 mg total) by mouth daily. 03/08/16   Minna Merritts, MD  metoprolol  succinate (TOPROL-XL) 25 MG 24 hr tablet Take 1 tablet (25 mg total) by mouth daily. Take with or immediately following a meal. 03/08/16   Minna Merritts, MD    Allergies Prednisone  Family History  Problem Relation Age of Onset  . Diabetes Mellitus II Mother   . Hypertension Father   . Cancer Paternal Aunt   . Cancer Maternal Grandfather     Social History Social History  Substance Use Topics  . Smoking status: Current Some Day Smoker    Packs/day: 0.00    Years:  33.00    Types: Cigarettes  . Smokeless tobacco: Never Used  . Alcohol use 1.8 oz/week    3 Cans of beer per week     Comment: occassionally    Review of Systems Constitutional: No fever/chills Cardiovascular: Denies chest pain. Respiratory: Denies shortness of breath. Gastrointestinal: No abdominal pain.  No nausea, no vomiting.  Musculoskeletal: Negative for back pain. Positive for left ankle pain. Skin: Negative for rash. Neurological: Negative for headaches, focal weakness or numbness.  10-point ROS otherwise negative.  ____________________________________________   PHYSICAL EXAM:  VITAL SIGNS: ED Triage Vitals  Enc Vitals Group     BP 05/23/16 0857 (!) 160/103     Pulse Rate 05/23/16 0857 82     Resp 05/23/16 0857 18     Temp 05/23/16 0857 97.5 F (36.4 C)     Temp Source 05/23/16 0857 Oral     SpO2 05/23/16 0857 96 %     Weight 05/23/16 0855 240 lb (108.9 kg)     Height 05/23/16 0855 6' (1.829 m)     Head Circumference --      Peak Flow --      Pain Score 05/23/16 0855 10     Pain Loc --      Pain Edu? --      Excl. in [place]? --     Constitutional: Alert and oriented. Well appearing and in no acute distress. Eyes: Conjunctivae are normal. PERRL. EOMI. Head: Atraumatic. Nose: No congestion/rhinnorhea. Neck: No stridor.   Cardiovascular: Normal rate, regular rhythm. Grossly normal heart sounds.  Good peripheral circulation. Respiratory: Normal respiratory effort.  No retractions. Lungs CTAB. Musculoskeletal: Left ankle no gross deformity is noted. There is minimal soft tissue swelling present. There is tenderness on palpation of the lateral aspect of the ankle. Range of motion is decreased somewhat secondary to discomfort. No ecchymosis or abrasions were noted. Neurologic:  Normal speech and language. No gross focal neurologic deficits are appreciated.  Skin:  Skin is warm, dry and intact. No rash noted. Psychiatric: Mood and affect are normal. Speech and  behavior are normal.  ____________________________________________   LABS (all labs ordered are listed, but only abnormal results are displayed)  Labs Reviewed - No data to display   RADIOLOGY  Left ankle x-ray per radiologist is negative for fracture. I, Johnn Hai, personally viewed and evaluated these images (plain radiographs) as part of my medical decision making, as well as reviewing the written report by the radiologist. ____________________________________________   PROCEDURES  Procedure(s) performed: None  Procedures  Critical Care performed: No  ____________________________________________   INITIAL IMPRESSION / ASSESSMENT AND PLAN / ED COURSE  Pertinent labs & imaging results that were available during my care of the patient were reviewed by me and considered in my medical decision making (see chart for details).    Clinical Course   She was placed in an Ace ankle stirrup splint and Ace  wrap. Patient was also offered crutches. Patient was given a prescription for ibuprofen 800 mg 3 times a day with food. He is encouraged to ice and elevate his ankle for pain and for any soft tissue edema. Patient is to follow-up with Dr. Roland Rack if not improving.  ____________________________________________   FINAL CLINICAL IMPRESSION(S) / ED DIAGNOSES  Final diagnoses:  Sprain of left ankle, unspecified ligament, initial encounter  Elevated blood pressure reading with diagnosis of hypertension      NEW MEDICATIONS STARTED DURING THIS VISIT:  Discharge Medication List as of 05/23/2016 10:37 AM    START taking these medications   Details  ibuprofen (ADVIL,MOTRIN) 800 MG tablet Take 1 tablet (800 mg total) by mouth 3 (three) times daily., Starting Thu 05/23/2016, Print         Note:  This document was prepared using Dragon voice recognition software and may include unintentional dictation errors.    Johnn Hai, PA-C 05/23/16 1605    Daymon Larsen, MD 05/24/16 [phone]

## 2016-05-23 NOTE — ED Triage Notes (Signed)
Pt to ed with c/o left ankle  Pain after stepping in a hole in the ground yesterday.  Pt reports increased pain with weight bearing.

## 2016-08-29 IMAGING — CR DG ABDOMEN 1V
2 series · 3 of 3 positions shown · non-contrast
Comparison: CT Abdomen and Pelvis 11/17/2012.

CLINICAL DATA: 44-year-old male with recurrent right groin pain now
radiating to the right lower abdomen. Nausea, abdominal pain,
diarrhea with bloody loose stool. Initial encounter.

EXAM:
ABDOMEN - 1 VIEW

[Series 1: abdomen kub · 0.14mm/px · 2 of 2 slices shown (1 of 2)]
[im 1/2]
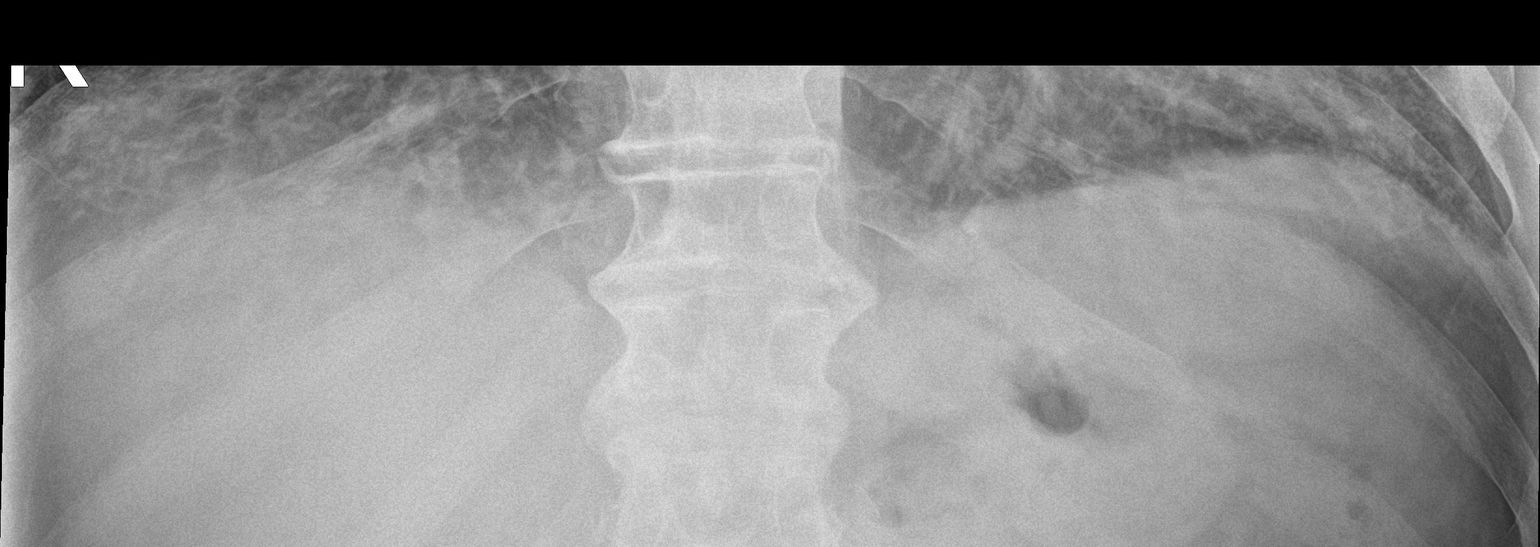
[im 2/2]
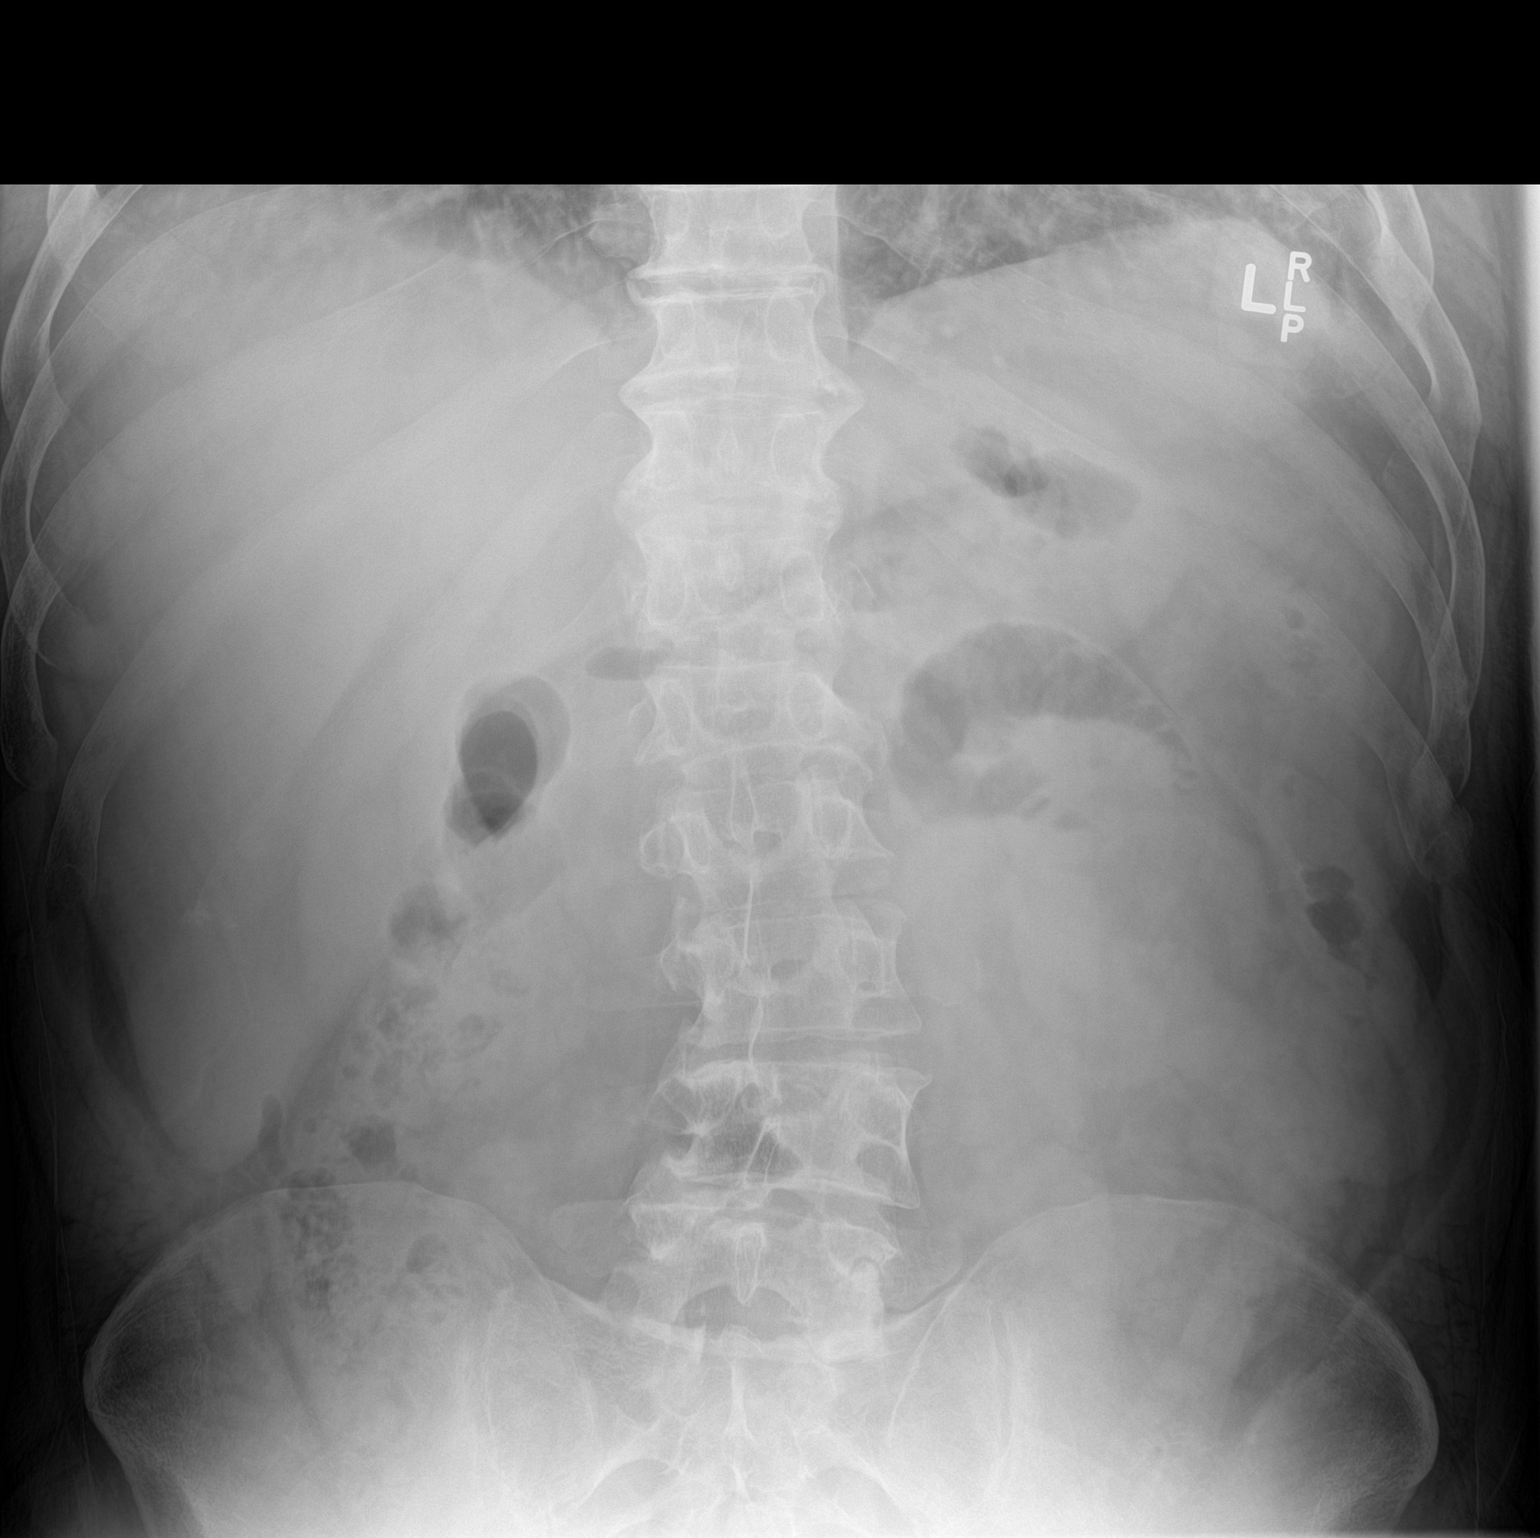

[abdomen kub (2 of 2)]
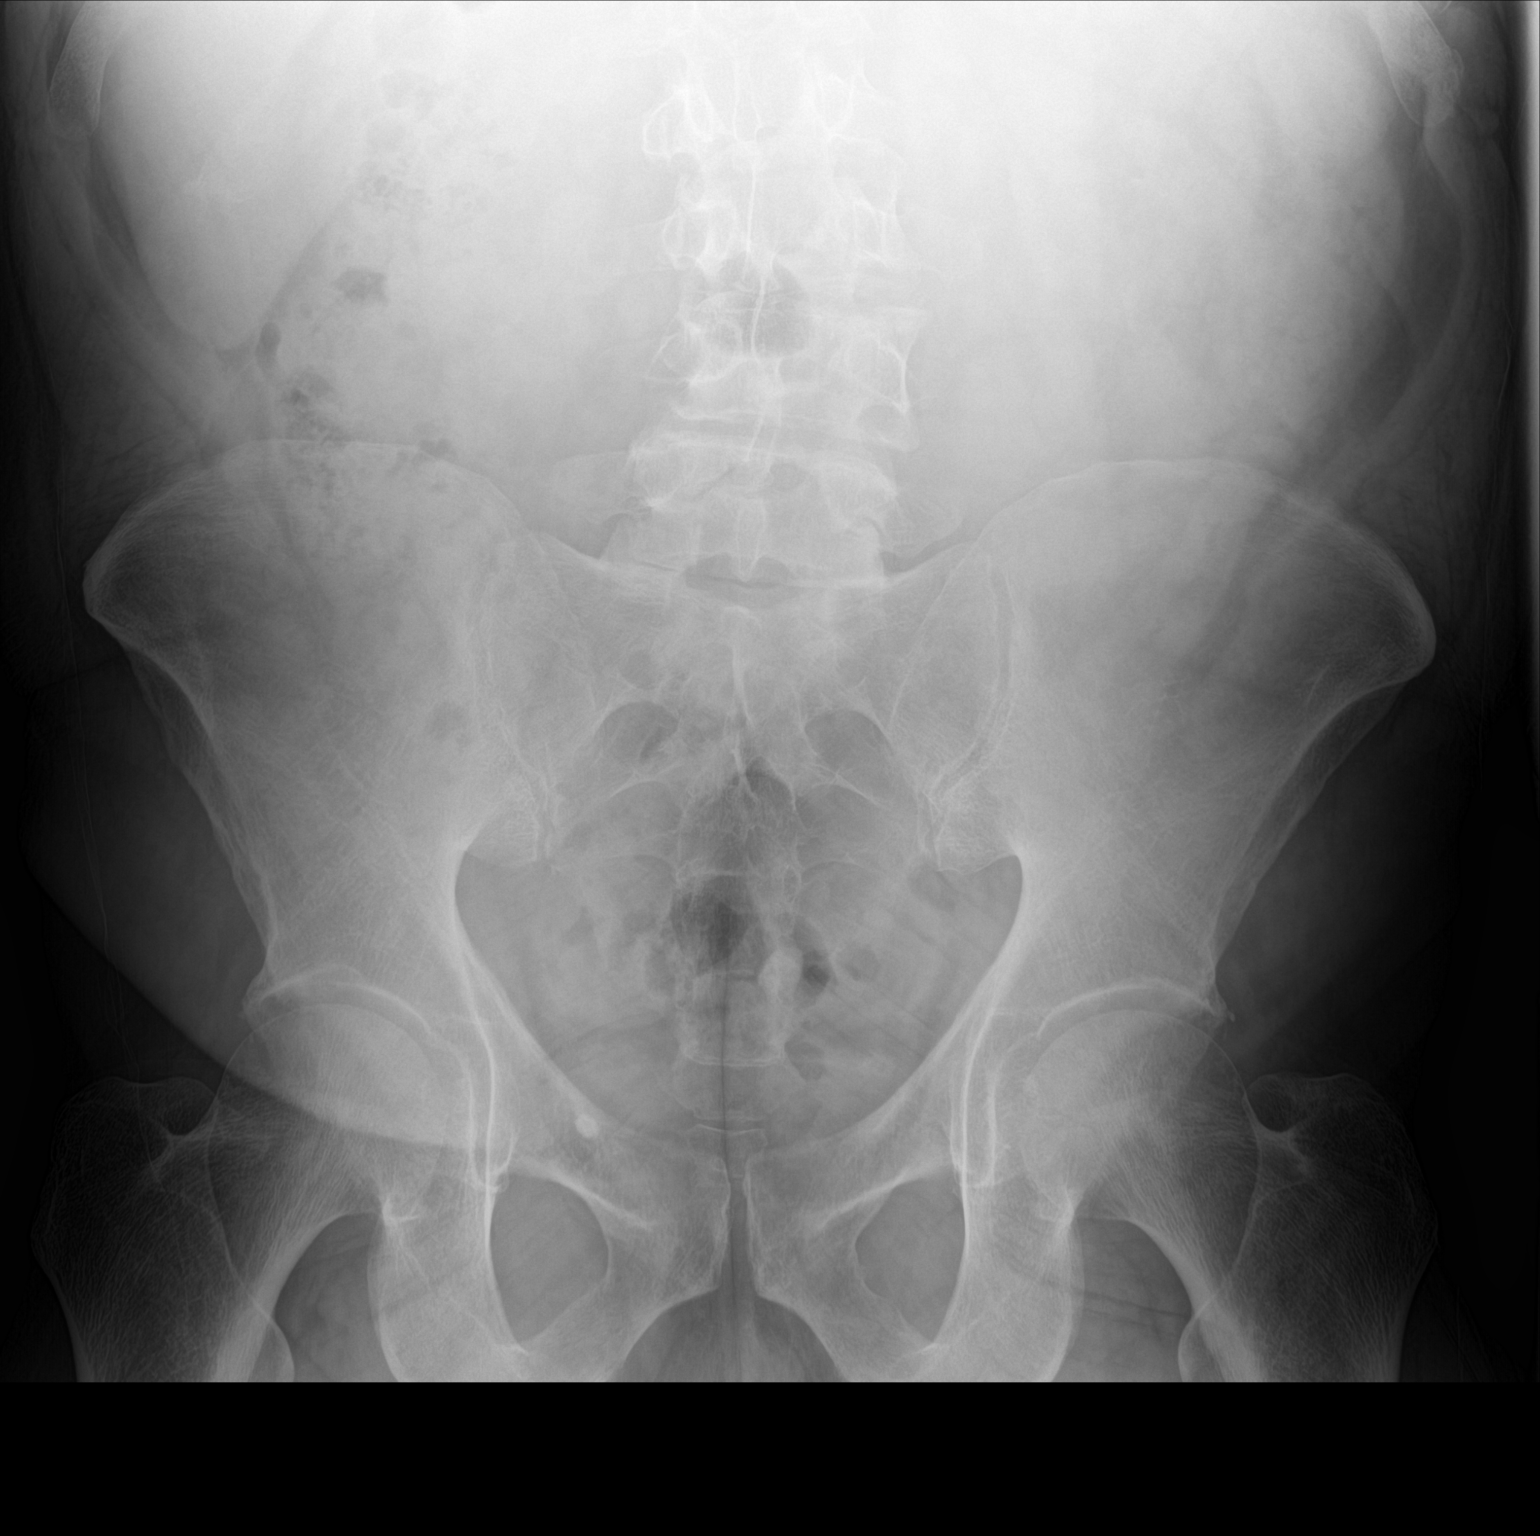

[3 of 3 positions shown; findings below may reference images not displayed]

FINDINGS: Three upright views of the abdomen and pelvis. No pneumoperitoneum.
No definite confluent opacity at the lung bases. Chronic levoconvex
lumbar scoliosis. Non obstructed bowel gas pattern. Chronic right
hemipelvis phlebolith. No urologic calculus identified. Abdominal
and pelvic visceral contours are within normal limits. No acute
osseous abnormality identified.
IMPRESSION: 1.  Normal bowel gas pattern, no free air.
2. No urologic calculus identified.

## 2016-09-10 IMAGING — CR DG CHEST 2V
1 series · 3 of 3 positions shown · non-contrast
Comparison: 10/07/2013

CLINICAL DATA: Shortness of breath and chest pain for the past
week, worsened tonight.

EXAM:
CHEST  2 VIEW

[Series 1: dg chest 2 view · 0.14mm/px · 3 of 3 slices shown]
[im 1/3]
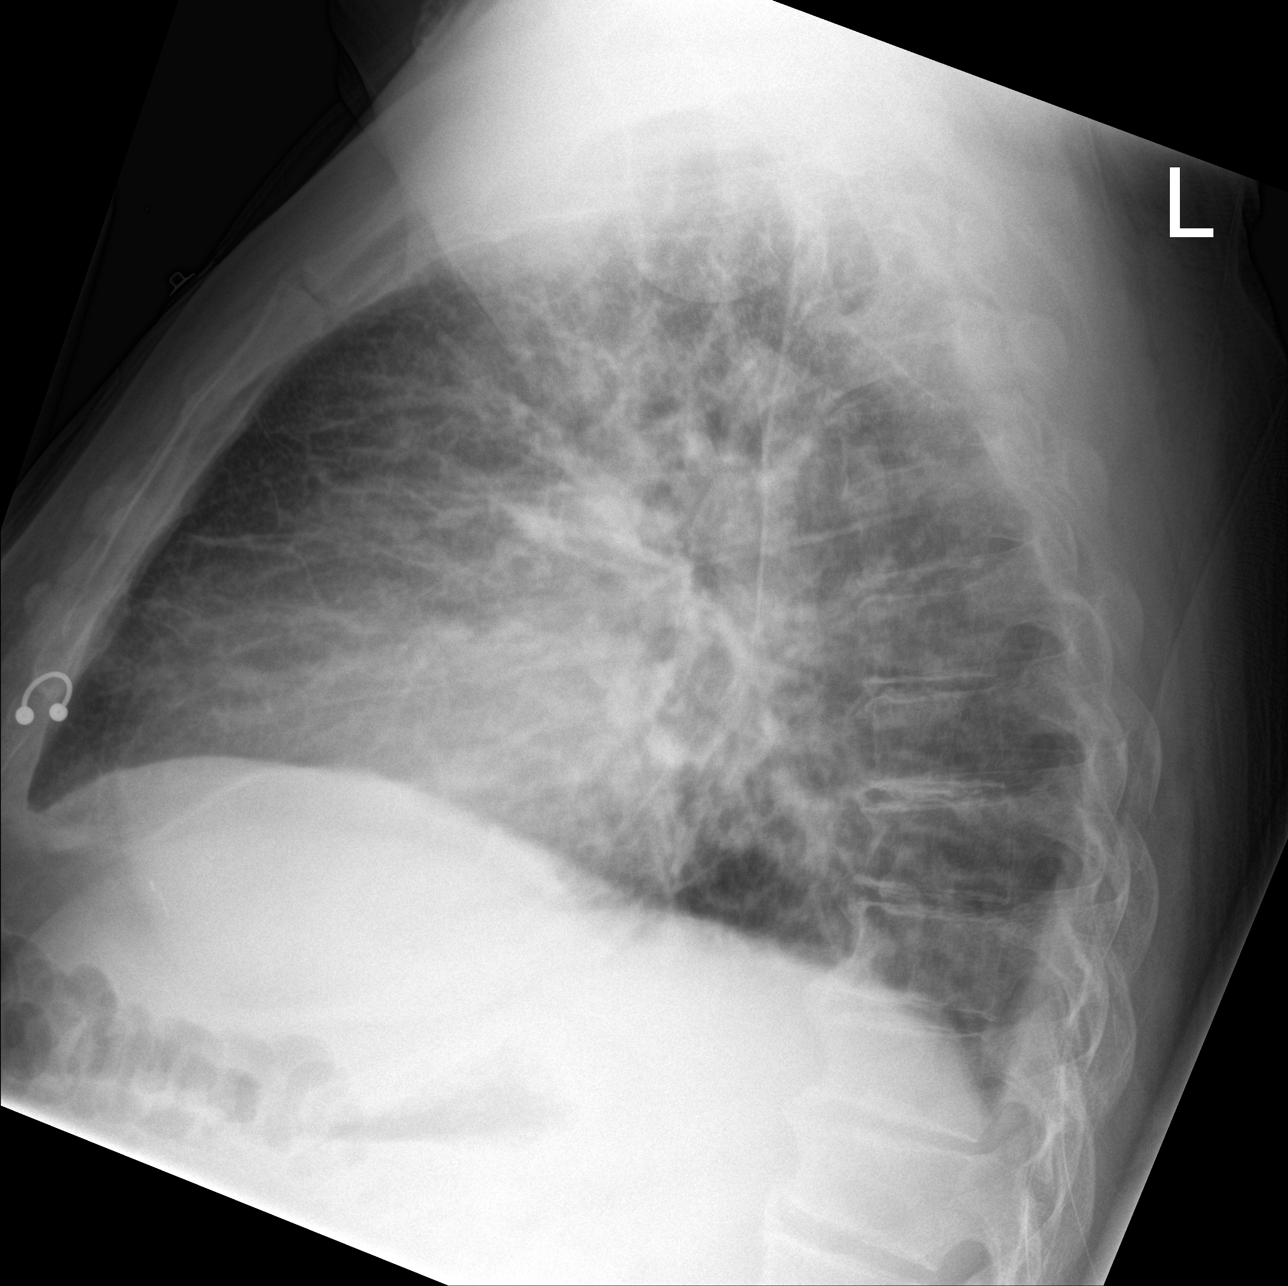
[im 2/3]
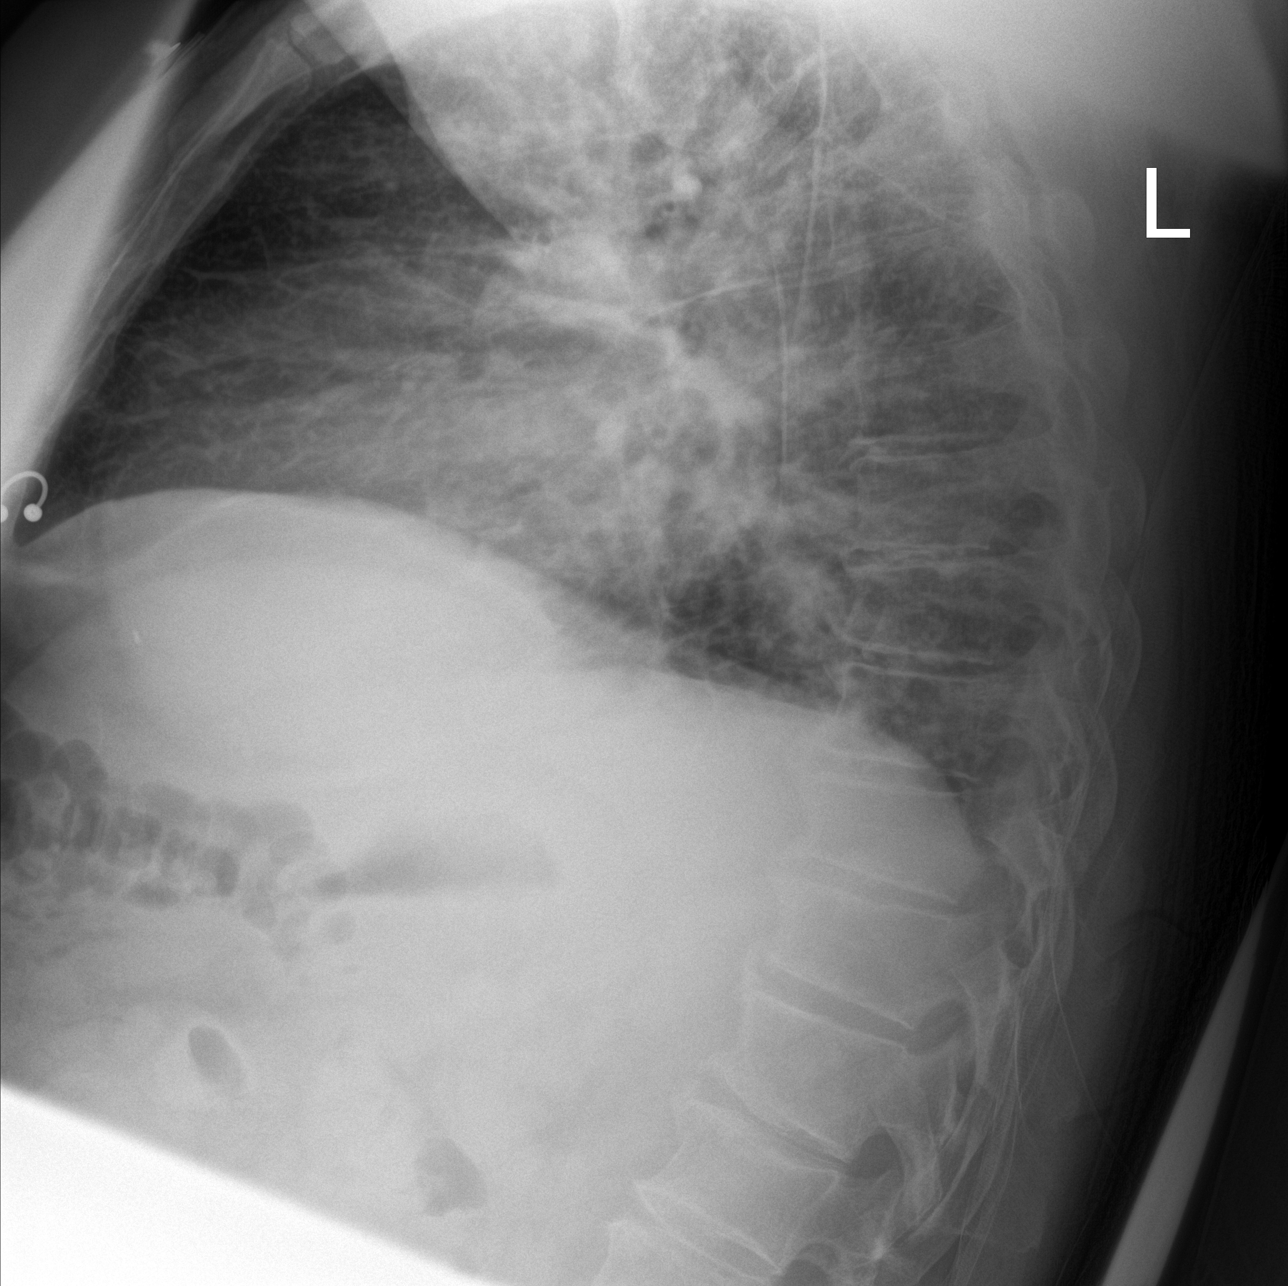
[im 3/3]
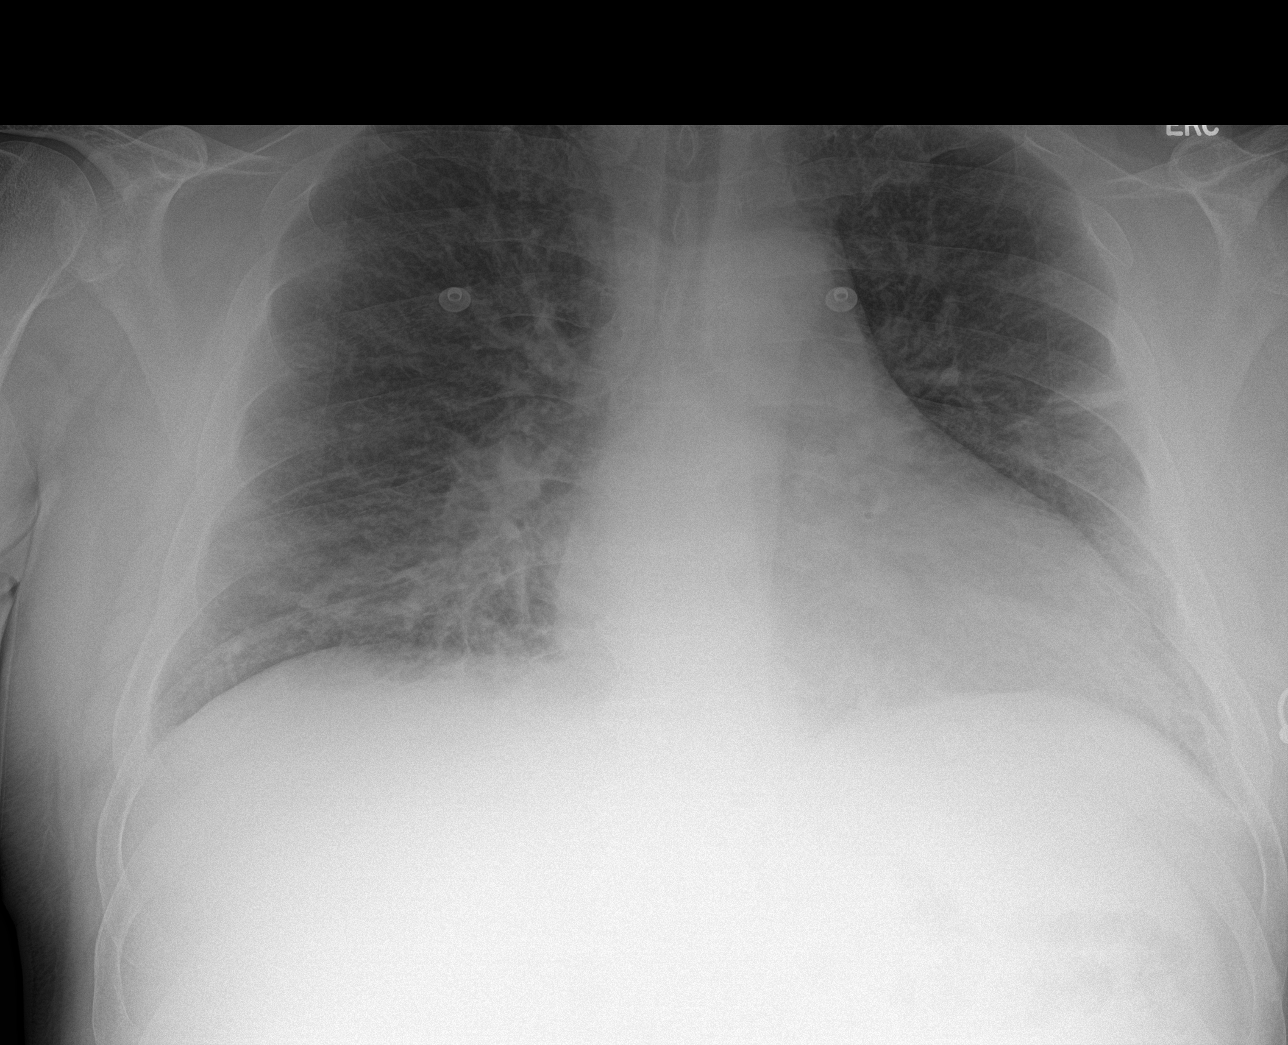

[3 of 3 positions shown; findings below may reference images not displayed]

FINDINGS: There is mild unchanged cardiomegaly. There is mild interstitial
coarsening. There is no alveolar consolidation. There is no
effusion.
IMPRESSION: Unchanged cardiomegaly. Interstitial coarsening, indeterminate
chronicity. No airspace consolidation. No effusion.

## 2016-09-10 IMAGING — CT CT ANGIO CHEST
1 of 2 series · 18 of 30 positions shown · IV contrast (APPLIED)
Comparison: None.

CLINICAL DATA: Nonproductive cough. Left midchest pain. Lower
extremity swelling.

EXAM:
CT ANGIOGRAPHY CHEST WITH CONTRAST
TECHNIQUE: Multidetector CT imaging of the chest was performed using the
standard protocol during bolus administration of intravenous
contrast. Multiplanar CT image reconstructions and MIPs were
obtained to evaluate the vascular anatomy.
CONTRAST:  100mL OMNIPAQUE IOHEXOL 350 MG/ML SOLN

[Series 5: pe 1.0 thins · axial · 0.77mm/px · z∈[-776,-504]mm · 18 of 307 slices shown]
[im 18/307  lung]
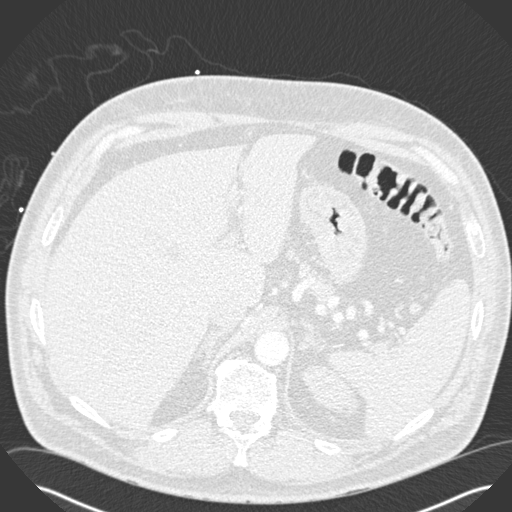
[im 35/307  mediastinal]
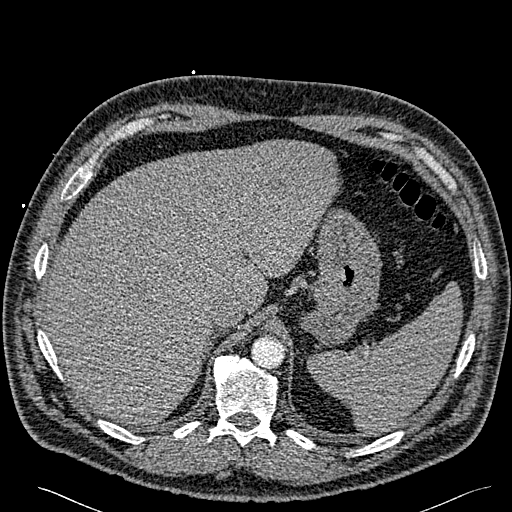
[im 52/307  lung]
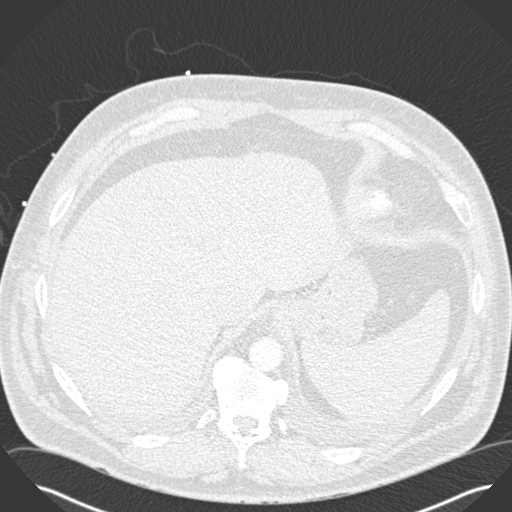
[im 69/307  mediastinal]
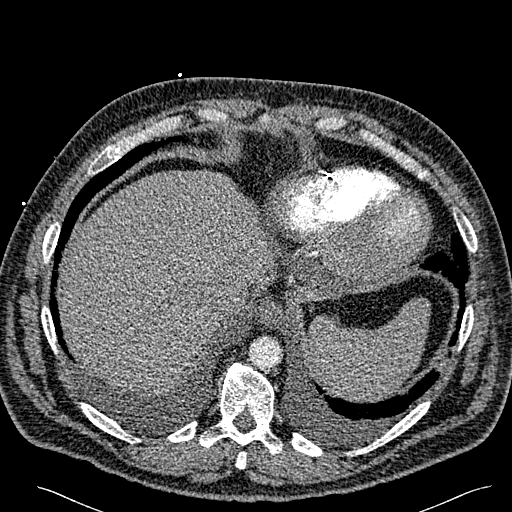
[im 86/307  lung]
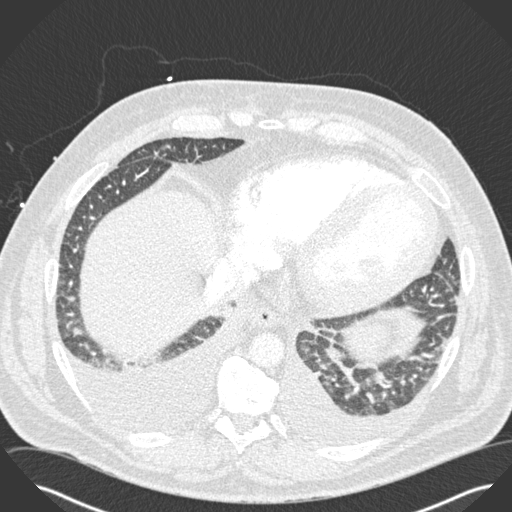
[im 103/307  mediastinal]
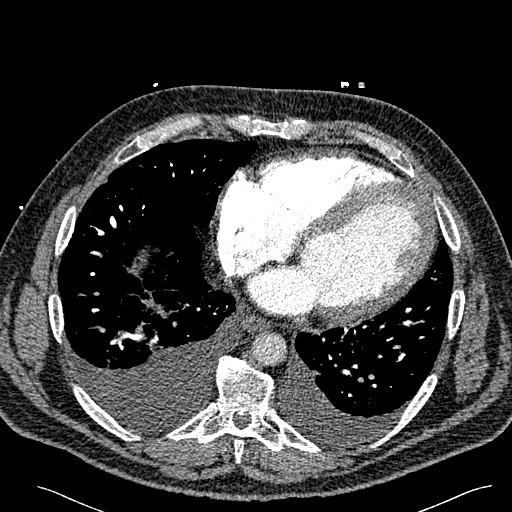
[im 120/307  lung]
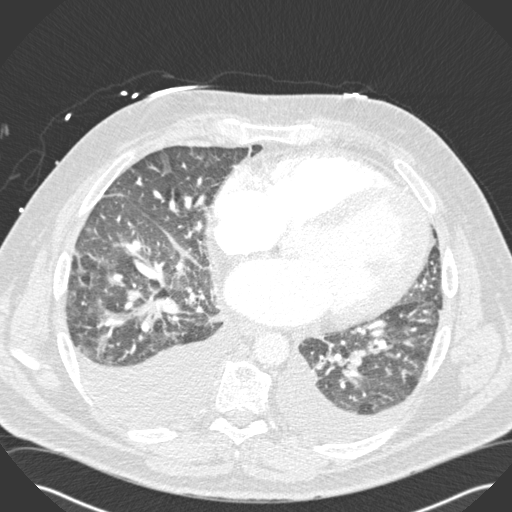
[im 137/307  mediastinal]
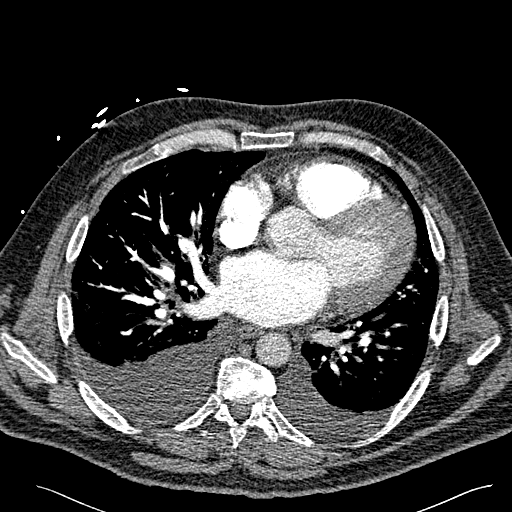
[im 144/307  lung]
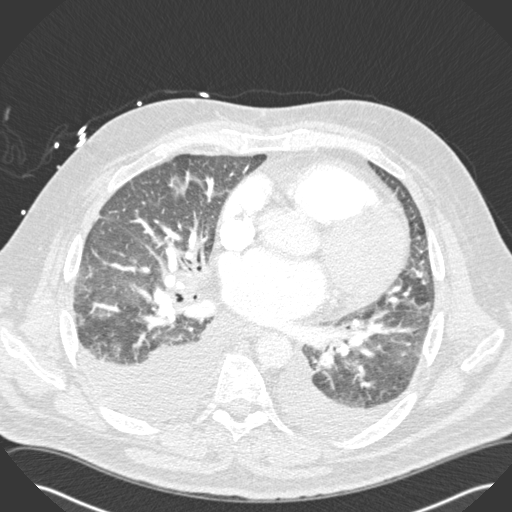
[im 154/307  mediastinal]
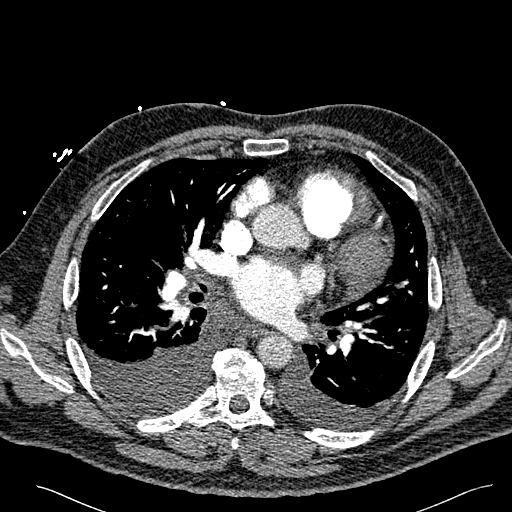
[im 171/307  lung]
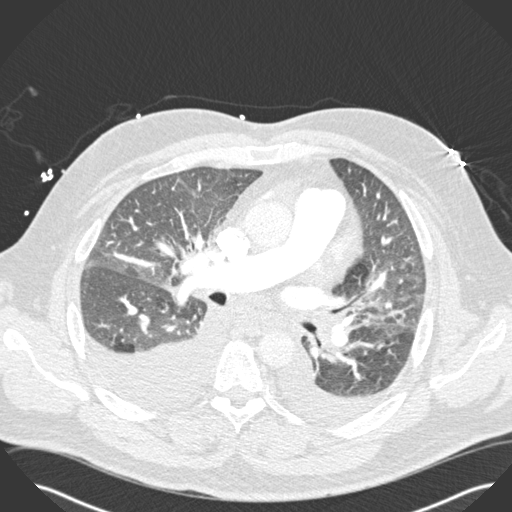
[im 188/307  mediastinal]
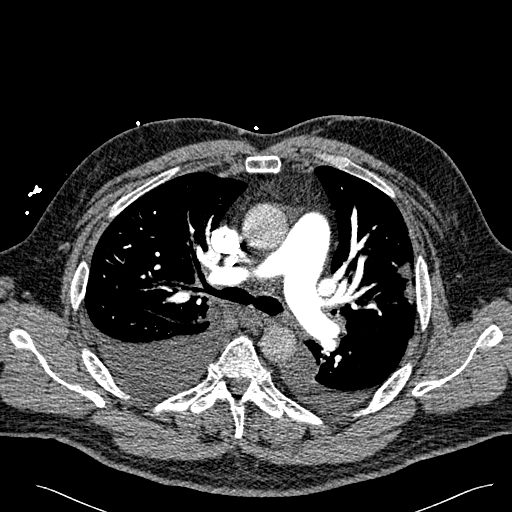
[im 205/307  lung]
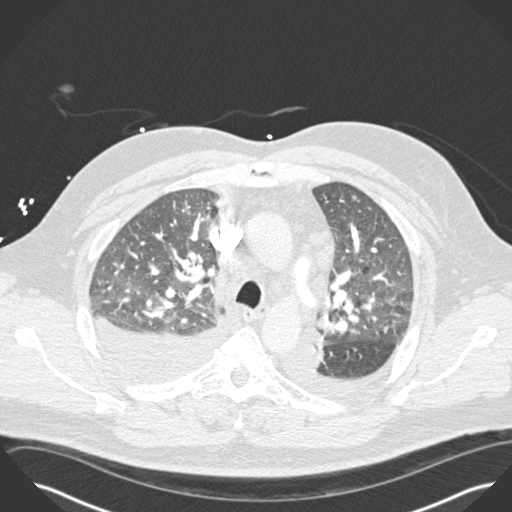
[im 222/307  mediastinal]
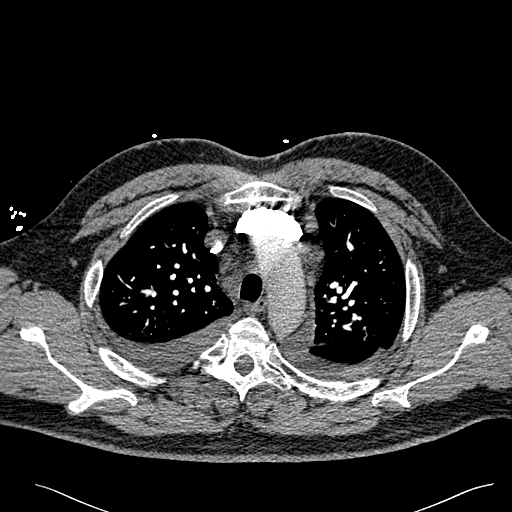
[im 239/307  lung]
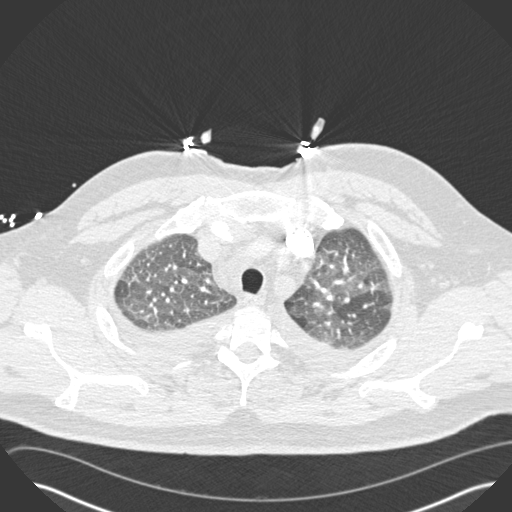
[im 256/307  mediastinal]
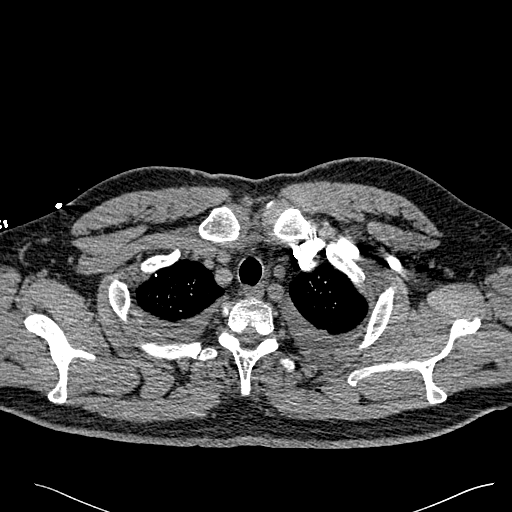
[im 273/307  lung]
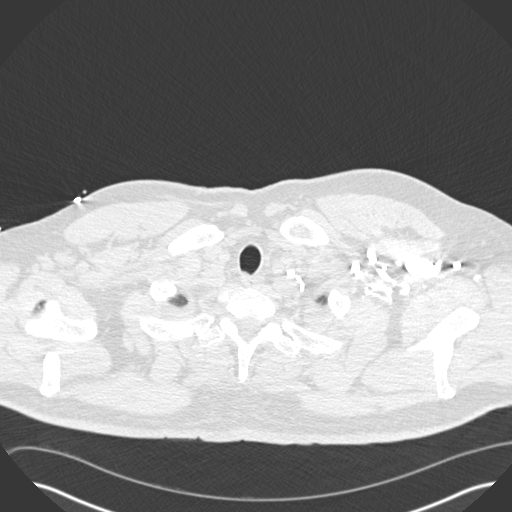
[im 290/307  mediastinal]
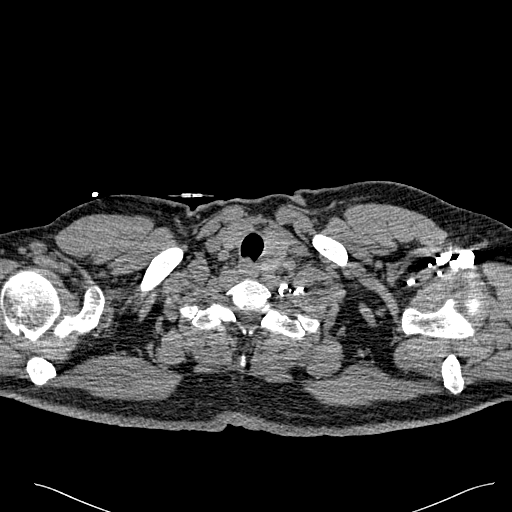

[18 of 30 positions shown; findings below may reference images not displayed]

FINDINGS: Cardiovascular: There is good opacification of the pulmonary
arteries. There is no pulmonary embolism. The thoracic aorta is
normal in caliber and intact.

Lungs: There is interlobular septal thickening suggesting
interstitial fluid. There are scattered ground-glass opacities which
could represent alveolar edema.

Central airways: Patent

Effusions: Small pleural effusion on the left. Moderate pleural
effusion on the right.

Lymphadenopathy: There is adenopathy with numerous nodes measuring
up to 1.5 cm short axis in the prevascular space and right
paratracheal space. There also is a subcarinal node measuring 2 cm
in short axis.

Esophagus: Unremarkable

Upper abdomen: No significant abnormality

Musculoskeletal: No significant abnormality

Review of the MIP images confirms the above findings.
IMPRESSION: 1. Negative for acute pulmonary embolism
2. Interstitial fluid or thickening. Ground-glass opacities which
may represent alveolar edema. This could represent congestive heart
failure.
3. Mediastinal adenopathy.

## 2016-09-19 ENCOUNTER — Emergency Department: Payer: Self-pay

## 2016-09-19 ENCOUNTER — Emergency Department
Admission: EM | Admit: 2016-09-19 | Discharge: 2016-09-19 | Disposition: A | Discharge status: Home / Self Care | Payer: Self-pay | Attending: Emergency Medicine | Admitting: Emergency Medicine

## 2016-09-19 ENCOUNTER — Encounter: Payer: Self-pay | Admitting: Emergency Medicine

## 2016-09-19 DIAGNOSIS — F17210 Nicotine dependence, cigarettes, uncomplicated: Secondary | ICD-10-CM | POA: Insufficient documentation

## 2016-09-19 DIAGNOSIS — J189 Pneumonia, unspecified organism: Secondary | ICD-10-CM | POA: Insufficient documentation

## 2016-09-19 DIAGNOSIS — Z760 Encounter for issue of repeat prescription: Secondary | ICD-10-CM | POA: Insufficient documentation

## 2016-09-19 DIAGNOSIS — J449 Chronic obstructive pulmonary disease, unspecified: Secondary | ICD-10-CM | POA: Insufficient documentation

## 2016-09-19 DIAGNOSIS — I2510 Atherosclerotic heart disease of native coronary artery without angina pectoris: Secondary | ICD-10-CM | POA: Insufficient documentation

## 2016-09-19 DIAGNOSIS — I110 Hypertensive heart disease with heart failure: Secondary | ICD-10-CM | POA: Insufficient documentation

## 2016-09-19 DIAGNOSIS — I429 Cardiomyopathy, unspecified: Secondary | ICD-10-CM | POA: Insufficient documentation

## 2016-09-19 DIAGNOSIS — E119 Type 2 diabetes mellitus without complications: Secondary | ICD-10-CM | POA: Insufficient documentation

## 2016-09-19 DIAGNOSIS — I5022 Chronic systolic (congestive) heart failure: Secondary | ICD-10-CM | POA: Insufficient documentation

## 2016-09-19 LAB — BASIC METABOLIC PANEL
Anion gap: 9 (ref 5–15)
BUN: 19 mg/dL (ref 6–20)
CO2: 26 mmol/L (ref 22–32)
Calcium: 8.6 mg/dL — ABNORMAL LOW (ref 8.9–10.3)
Chloride: 105 mmol/L (ref 101–111)
Creatinine, Ser: 1.11 mg/dL (ref 0.61–1.24)
GFR calc Af Amer: >60 mL/min (ref >60)
GFR calc non Af Amer: >60 mL/min (ref >60)
Glucose, Bld: 138 mg/dL — ABNORMAL HIGH (ref 65–99)
Potassium: 3.5 mmol/L (ref 3.5–5.1)
Sodium: 140 mmol/L (ref 135–145)

## 2016-09-19 LAB — CBC
HCT: 45.1 % (ref 40.0–52.0)
Hemoglobin: 15 g/dL (ref 13.0–18.0)
MCH: 26.9 pg (ref 26.0–34.0)
MCHC: 33.2 g/dL (ref 32.0–36.0)
MCV: 81.1 fL (ref 80.0–100.0)
Platelets: 183 10*3/uL (ref 150–440)
RBC: 5.56 MIL/uL (ref 4.40–5.90)
RDW: 15.6 % — ABNORMAL HIGH (ref 11.5–14.5)
WBC: 10.8 10*3/uL — ABNORMAL HIGH (ref 3.8–10.6)

## 2016-09-19 LAB — BRAIN NATRIURETIC PEPTIDE: B Natriuretic Peptide: 645 pg/mL — ABNORMAL HIGH (ref 0.0–100.0)

## 2016-09-19 LAB — TROPONIN I
Troponin I: 0.08 ng/mL (ref <0.03)
Troponin I: 0.09 ng/mL (ref <0.03)

## 2016-09-19 MED ORDER — LEVOFLOXACIN IN D5W 750 MG/150ML IV SOLN
750.0000 mg | Freq: Once | INTRAVENOUS | Status: AC
  Administered 2016-09-19: 750 mg via INTRAVENOUS
  Filled 2016-09-19: qty 150

## 2016-09-19 MED ORDER — ASPIRIN 81 MG PO CHEW: 81.0000 mg | CHEWABLE_TABLET | Freq: Every day | ORAL | 2 refills | Status: DC

## 2016-09-19 MED ORDER — LEVOFLOXACIN 500 MG PO TABS: 500.0000 mg | ORAL_TABLET | Freq: Every day | ORAL | 0 refills | Status: AC

## 2016-09-19 MED ORDER — AMLODIPINE BESYLATE 10 MG PO TABS: 10.0000 mg | ORAL_TABLET | Freq: Every day | ORAL | 6 refills | Status: DC

## 2016-09-19 MED ORDER — AMIODARONE HCL 200 MG PO TABS: 200.0000 mg | ORAL_TABLET | Freq: Two times a day (BID) | ORAL | 3 refills | Status: DC

## 2016-09-19 MED ORDER — HYDROCOD POLST-CPM POLST ER 10-8 MG/5ML PO SUER
5.0000 mL | Freq: Once | ORAL | Status: AC
  Administered 2016-09-19: 5 mL via ORAL
  Filled 2016-09-19: qty 5

## 2016-09-19 MED ORDER — LISINOPRIL 20 MG PO TABS: 20.0000 mg | ORAL_TABLET | Freq: Every day | ORAL | 6 refills | Status: DC

## 2016-09-19 MED ORDER — METOPROLOL SUCCINATE ER 25 MG PO TB24: 25.0000 mg | ORAL_TABLET | Freq: Every day | ORAL | 6 refills | Status: DC

## 2016-09-19 MED ORDER — FUROSEMIDE 40 MG PO TABS: 40.0000 mg | ORAL_TABLET | ORAL | 2 refills | Status: DC

## 2016-09-19 MED ORDER — HYDROCODONE-ACETAMINOPHEN 5-325 MG PO TABS: 1.0000 | ORAL_TABLET | Freq: Four times a day (QID) | ORAL | 0 refills | Status: DC | PRN

## 2016-09-19 NOTE — ED Triage Notes (Signed)
C/O SOB and tightness in chest x 3 days.  Also c/o intermittent hemoptysis.  Also intermittent chills x 3 days.

## 2016-09-19 NOTE — ED Notes (Signed)
Pt hypertensive, states he hasn't taken meds in "months", MD notified

## 2016-09-19 NOTE — ED Provider Notes (Signed)
[facility] Emergency Department Provider Note        Time seen: ----------------------------------------- 12:55 PM on 09/19/2016 -----------------------------------------    I have reviewed the triage vital signs and the nursing notes.   HISTORY  Chief Complaint Shortness of Breath and Chest Pain    HPI Gabriel N Bronc Buckbee. is a 46 y.o. male who presents to ER for shortness of breath and chest tightness for the last 3 days. Patient's had intermittent hemoptysis and chills during the same time period. Pain is 7 out of 10 in his chest, worse on the right side.Patient states his symptoms are worse with coughing. He denies any flu symptoms such chest fevers, chills or aches. He denies stomach upset or vomiting and diarrhea.   Past Medical History:  Diagnosis Date  . Cardiomyopathy ([place])   . CHF (congestive heart failure) ([place])   . COPD (chronic obstructive pulmonary disease) ([place])   . Diabetes mellitus without complication ([place])    Pre diabetic  . Hypertension    Resolved since weight loss  . Obesity (BMI 30.0-34.9)   . Psoriasis   . Systolic CHF [facility])     Patient Active Problem List   Diagnosis Date Noted  . HCAP (healthcare-associated pneumonia) 03/15/2016  . Coronary artery disease, non-occlusive 03/15/2016  . Acute respiratory distress 03/15/2016  . Chest pain 02/16/2016  . Orthostatic hypotension 01/23/2016  . Diarrhea 01/23/2016  . Hypokalemia 01/23/2016  . Hypomagnesemia 01/23/2016  . Chronic systolic CHF (congestive heart failure) ([place]) 01/23/2016  . Syncope 01/22/2016  . Congestive dilated cardiomyopathy ([place]) 01/17/2016  . NSVT (nonsustained ventricular tachycardia) ([place])   . 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 CAP (community acquired pneumonia)   . Arterial hypotension   . Elevated troponin   . Chronic systolic heart failure ([place])   . Sepsis ([place]) 01/03/2016  . Acute on chronic combined systolic and diastolic CHF  (congestive heart failure) ([place]) 05/17/2015  . Type II diabetes mellitus ([place]) 05/17/2015  . Hyperlipidemia 05/17/2015  . COPD (chronic obstructive pulmonary disease) ([place]) 05/17/2015  . HTN (hypertension) 03/31/2015  . Smoking 03/31/2015    Past Surgical History:  Procedure Laterality Date  . AMPUTATION    . FINGER AMPUTATION     Traumatic    Allergies Prednisone  Social History Social History  Substance Use Topics  . Smoking status: Current Some Day Smoker    Packs/day: 0.00    Years: 33.00    Types: Cigarettes  . Smokeless tobacco: Never Used  . Alcohol use 1.8 oz/week    3 Cans of beer per week     Comment: occassionally    Review of Systems Constitutional: Negative for fever. Cardiovascular: Positive for chest tightness Respiratory:Positive for shortness of breath and cough Gastrointestinal: Negative for abdominal pain, vomiting and diarrhea. Genitourinary: Negative for dysuria. Musculoskeletal: Negative for back pain. Skin: Negative for rash. Neurological: Negative for headaches, focal weakness or numbness.  10-point ROS otherwise negative.  ____________________________________________   PHYSICAL EXAM:  VITAL SIGNS: ED Triage Vitals  Enc Vitals Group     BP 09/19/16 1138 (!) 166/109     Pulse Rate 09/19/16 1138 91     Resp 09/19/16 1138 18     Temp 09/19/16 1138 97.8 F (36.6 C)     Temp Source 09/19/16 1138 Oral     SpO2 09/19/16 1138 98 %     Weight 09/19/16 1135 245 lb (111.1 kg)     Height 09/19/16 1135 6' (1.829 m)  Head Circumference --      Peak Flow --      Pain Score 09/19/16 1135 7     Pain Loc --      Pain Edu? --      Excl. in [place]? --     Constitutional: Alert and oriented. Well appearing and in no distress. Eyes: Conjunctivae are normal. PERRL. Normal extraocular movements. ENT   Head: Normocephalic and atraumatic.   Nose: No congestion/rhinnorhea.   Mouth/Throat: Mucous membranes are moist.   Neck: No  stridor. Cardiovascular: Normal rate, regular rhythm. No murmurs, rubs, or gallops. Respiratory: Normal respiratory effort without tachypnea nor retractions. Breath sounds are clear and equal bilaterally. No wheezes/rales/rhonchi. Gastrointestinal: Soft and nontender. Normal bowel sounds Musculoskeletal: Nontender with normal range of motion in all extremities. No lower extremity tenderness nor edema. Neurologic:  Normal speech and language. No gross focal neurologic deficits are appreciated.  Skin:  Skin is warm, dry and intact. No rash noted. Psychiatric: Mood and affect are normal. Speech and behavior are normal.  ____________________________________________  EKG: Interpreted by me. Sinus rhythm with a rate of 90 bpm, normal PR interval, normal QRS, normal QT. Nonspecific T-wave changes  ____________________________________________  ED COURSE:  Pertinent labs & imaging results that were available during my care of the patient were reviewed by me and considered in my medical decision making (see chart for details). Patient is in no distress, presenting with more respiratory symptoms. We will assess with labs and imaging.   Procedures ____________________________________________   LABS (pertinent positives/negatives)  Labs Reviewed  BASIC METABOLIC PANEL - Abnormal; Notable for the following:       Result Value   Glucose, Bld 138 (*)    Calcium 8.6 (*)    All other components within normal limits  CBC - Abnormal; Notable for the following:    WBC 10.8 (*)    RDW 15.6 (*)    All other components within normal limits  TROPONIN I - Abnormal; Notable for the following:    Troponin I 0.08 (*)    All other components within normal limits  BRAIN NATRIURETIC PEPTIDE - Abnormal; Notable for the following:    B Natriuretic Peptide 645.0 (*)    All other components within normal limits  TROPONIN I - Abnormal; Notable for the following:    Troponin I 0.09 (*)    All other components  within normal limits    RADIOLOGY Images were viewed by me  IMPRESSION: Cardiac enlargement, stable.  Findings suggest bronchitis and possible early right lower lobe bronchopneumonia.  ____________________________________________  FINAL ASSESSMENT AND PLAN  Community-acquired pneumonia, chronically elevated troponin, prescription refill  Plan: Patient with labs and imaging as dictated above. Patient reports she's been out of all of his medicines for some time. I will refer him to medication management and will write for refills of all of his medicines. He will also be on antibiotics for his cough. He appears stable for outpatient follow-up at this time. His troponin is slightly elevated on repeat but has not significantly changed.   Earleen Newport, MD   Note: This note was generated in part or whole with voice recognition software. Voice recognition is usually quite accurate but there are transcription errors that can and very often do occur. I apologize for any typographical errors that were not detected and corrected.     Earleen Newport, MD 09/19/16 [phone]

## 2016-09-20 ENCOUNTER — Telehealth: Payer: Self-pay | Admitting: Emergency Medicine

## 2016-09-20 NOTE — Telephone Encounter (Signed)
Medication management called due to levaquin compatability with amiodarone.  Per dr Burlene Arnt can change med to augmentin 875 mg po twice daily for 10 day.s

## 2016-10-14 ENCOUNTER — Ambulatory Visit: Payer: Self-pay | Admitting: Pharmacy Technician

## 2016-10-14 NOTE — Progress Notes (Signed)
Patient scheduled for [facility].  Patient did not show for the appointment on 2.26/18 at 9:00a.m.  Patient did not reschedule eligibility appointment.  [facility]

## 2016-10-15 ENCOUNTER — Telehealth: Payer: Self-pay | Admitting: Pharmacy Technician

## 2016-10-15 NOTE — Telephone Encounter (Signed)
Patient failed to provide 2018 financial information.  [facility] will be unable to provide ongoing medication assistance until 2018 poi is provided to ensure patient still meets eligibility criteria.  Patient notified.  [facility]

## 2016-10-18 IMAGING — CR DG CHEST 2V
2 series · 2 of 2 positions shown · non-contrast
Comparison: March 20, 2015.

CLINICAL DATA: Hemoptysis.

EXAM:
CHEST  2 VIEW

[chest pa]
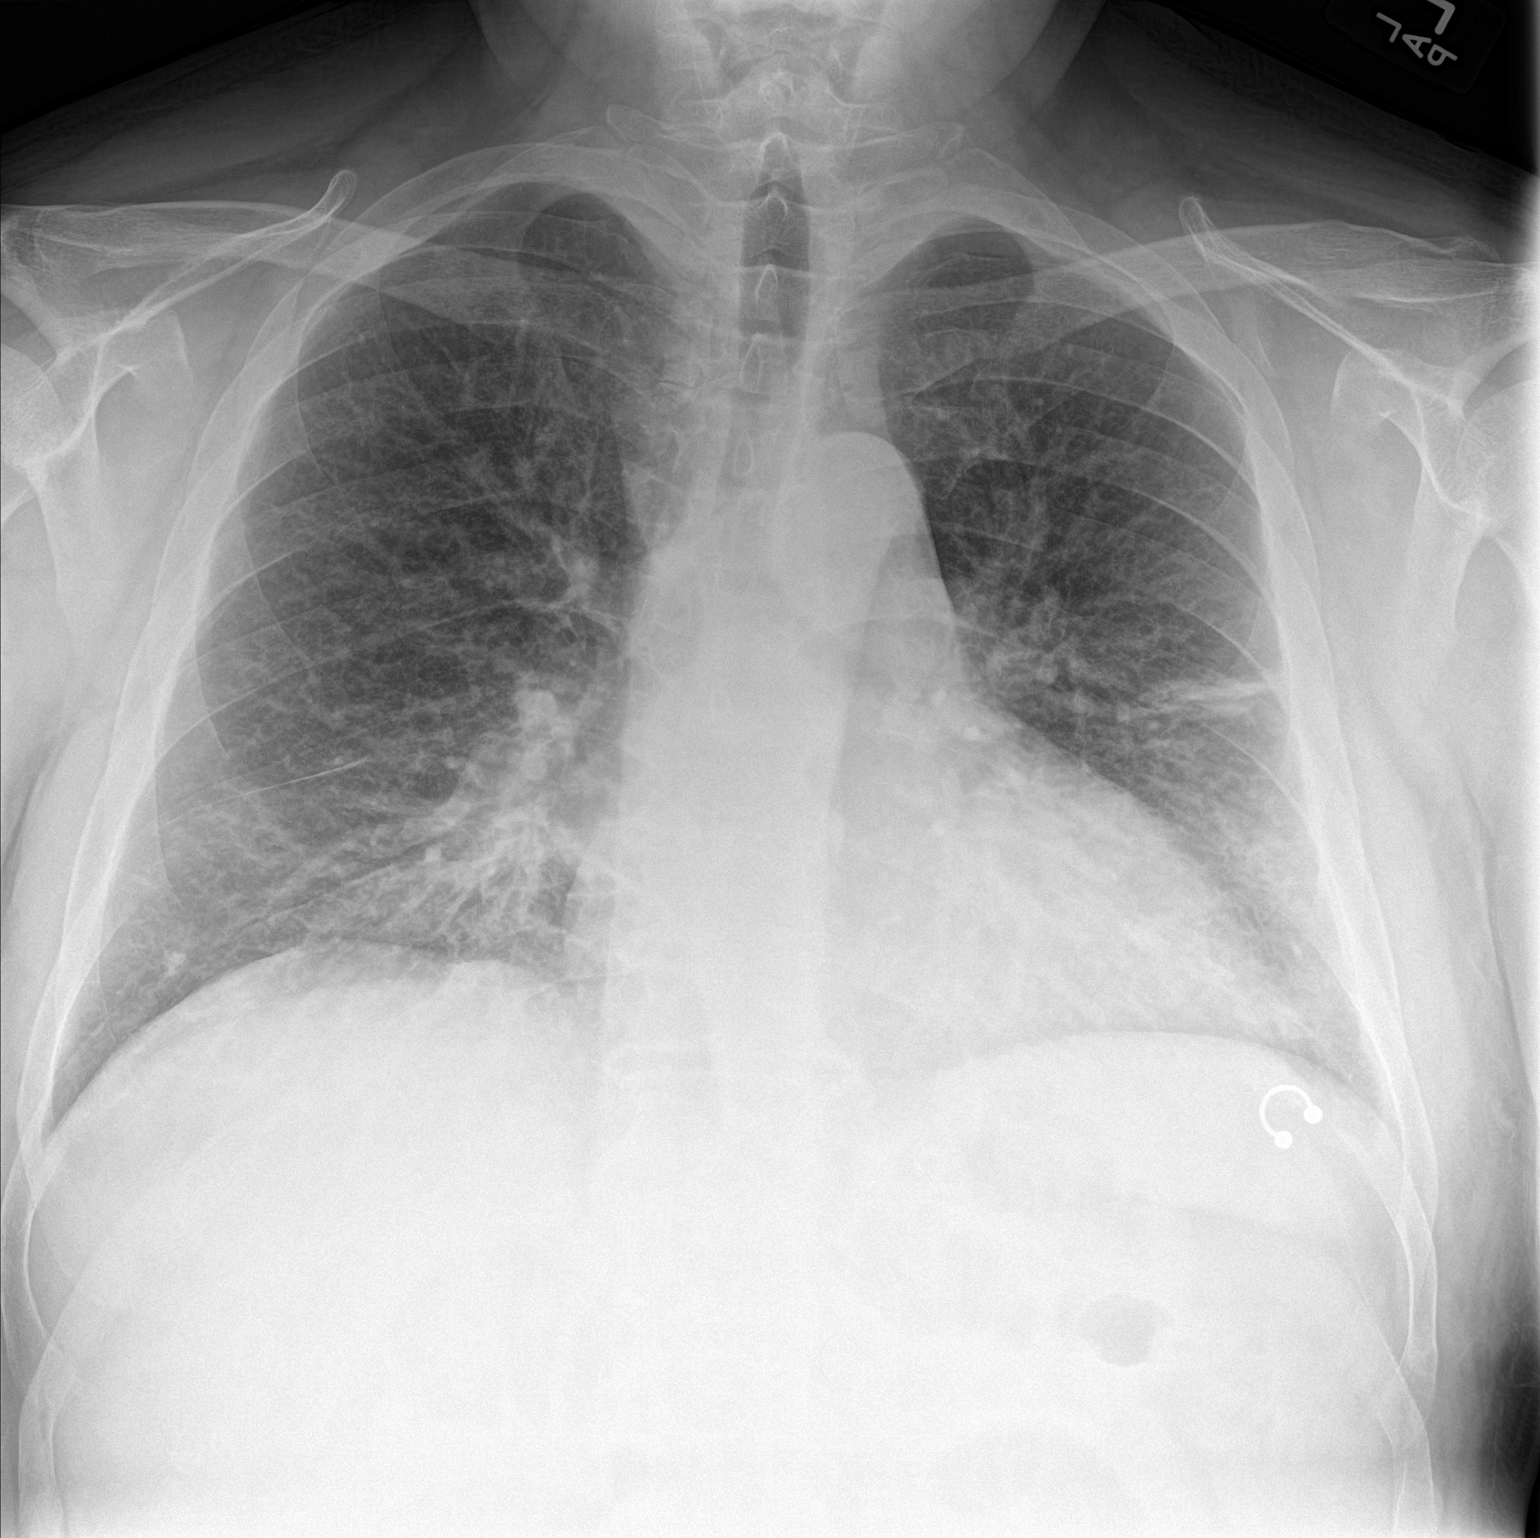

[chest lat]
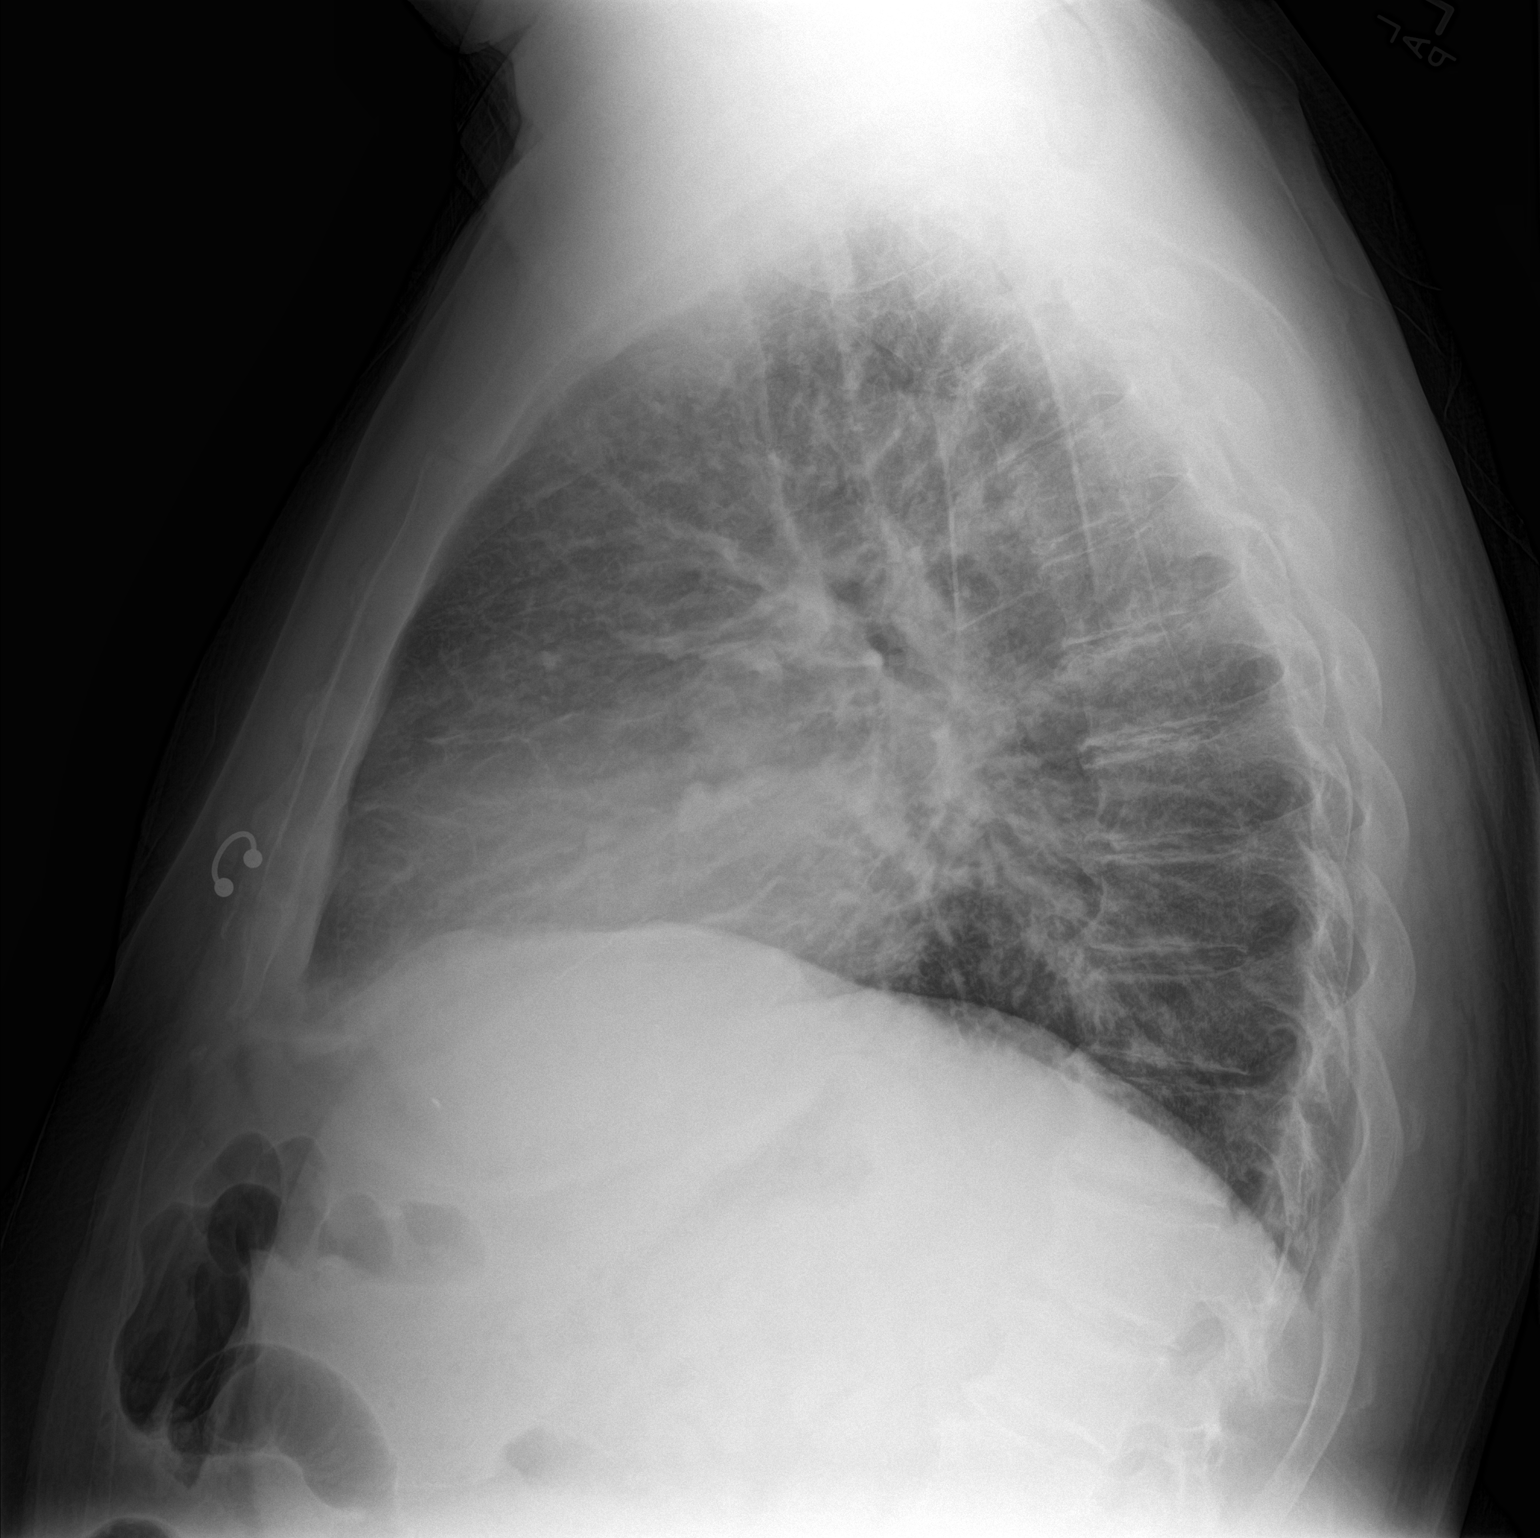

[2 of 2 positions shown; findings below may reference images not displayed]

FINDINGS: Stable cardiomediastinal silhouette. No pneumothorax or pleural
effusion is noted. Stable mild central pulmonary vascular congestion
is noted. Stable linear density is noted laterally in the left
midlung consistent with scarring or subsegmental atelectasis. Bony
thorax appears intact.
IMPRESSION: Stable mild central pulmonary vascular congestion. Stable left
midlung scarring or subsegmental atelectasis.

## 2016-10-28 ENCOUNTER — Emergency Department: Payer: Self-pay

## 2016-10-28 ENCOUNTER — Inpatient Hospital Stay
Admission: EM | Admit: 2016-10-28 | Discharge: 2016-10-30 | DRG: 291 | Disposition: A | Discharge status: Home / Self Care | Payer: Self-pay | Attending: Internal Medicine | Admitting: Internal Medicine

## 2016-10-28 ENCOUNTER — Encounter: Payer: Self-pay | Admitting: Emergency Medicine

## 2016-10-28 DIAGNOSIS — E119 Type 2 diabetes mellitus without complications: Secondary | ICD-10-CM | POA: Diagnosis present

## 2016-10-28 DIAGNOSIS — R0602 Shortness of breath: Secondary | ICD-10-CM

## 2016-10-28 DIAGNOSIS — I509 Heart failure, unspecified: Secondary | ICD-10-CM

## 2016-10-28 DIAGNOSIS — R778 Other specified abnormalities of plasma proteins: Secondary | ICD-10-CM | POA: Diagnosis present

## 2016-10-28 DIAGNOSIS — Z79899 Other long term (current) drug therapy: Secondary | ICD-10-CM

## 2016-10-28 DIAGNOSIS — E1122 Type 2 diabetes mellitus with diabetic chronic kidney disease: Secondary | ICD-10-CM | POA: Diagnosis present

## 2016-10-28 DIAGNOSIS — N183 Chronic kidney disease, stage 3 (moderate): Secondary | ICD-10-CM | POA: Diagnosis present

## 2016-10-28 DIAGNOSIS — Z6834 Body mass index (BMI) 34.0-34.9, adult: Secondary | ICD-10-CM

## 2016-10-28 DIAGNOSIS — J449 Chronic obstructive pulmonary disease, unspecified: Secondary | ICD-10-CM | POA: Diagnosis present

## 2016-10-28 DIAGNOSIS — E669 Obesity, unspecified: Secondary | ICD-10-CM | POA: Diagnosis present

## 2016-10-28 DIAGNOSIS — F17210 Nicotine dependence, cigarettes, uncomplicated: Secondary | ICD-10-CM | POA: Diagnosis present

## 2016-10-28 DIAGNOSIS — Z9119 Patient's noncompliance with other medical treatment and regimen: Secondary | ICD-10-CM

## 2016-10-28 DIAGNOSIS — Z7982 Long term (current) use of aspirin: Secondary | ICD-10-CM

## 2016-10-28 DIAGNOSIS — I5023 Acute on chronic systolic (congestive) heart failure: Secondary | ICD-10-CM | POA: Diagnosis present

## 2016-10-28 DIAGNOSIS — I130 Hypertensive heart and chronic kidney disease with heart failure and stage 1 through stage 4 chronic kidney disease, or unspecified chronic kidney disease: Principal | ICD-10-CM | POA: Diagnosis present

## 2016-10-28 LAB — BASIC METABOLIC PANEL
Anion gap: 6 (ref 5–15)
BUN: 18 mg/dL (ref 6–20)
CO2: 25 mmol/L (ref 22–32)
Calcium: 8.6 mg/dL — ABNORMAL LOW (ref 8.9–10.3)
Chloride: 108 mmol/L (ref 101–111)
Creatinine, Ser: 1.1 mg/dL (ref 0.61–1.24)
GFR calc Af Amer: >60 mL/min (ref >60)
GFR calc non Af Amer: >60 mL/min (ref >60)
Glucose, Bld: 114 mg/dL — ABNORMAL HIGH (ref 65–99)
Potassium: 3.8 mmol/L (ref 3.5–5.1)
Sodium: 139 mmol/L (ref 135–145)

## 2016-10-28 LAB — CBC
HCT: 45.5 % (ref 40.0–52.0)
Hemoglobin: 15.2 g/dL (ref 13.0–18.0)
MCH: 26.7 pg (ref 26.0–34.0)
MCHC: 33.3 g/dL (ref 32.0–36.0)
MCV: 80.2 fL (ref 80.0–100.0)
Platelets: 169 10*3/uL (ref 150–440)
RBC: 5.68 MIL/uL (ref 4.40–5.90)
RDW: 16.5 % — ABNORMAL HIGH (ref 11.5–14.5)
WBC: 10.3 10*3/uL (ref 3.8–10.6)

## 2016-10-28 LAB — TROPONIN I
Troponin I: 0.08 ng/mL (ref <0.03)
Troponin I: 0.07 ng/mL (ref <0.03)

## 2016-10-28 LAB — GLUCOSE, CAPILLARY
Glucose-Capillary: 90 mg/dL (ref 65–99)
Glucose-Capillary: 120 mg/dL — ABNORMAL HIGH (ref 65–99)

## 2016-10-28 LAB — BRAIN NATRIURETIC PEPTIDE: B Natriuretic Peptide: 627 pg/mL — ABNORMAL HIGH (ref 0.0–100.0)

## 2016-10-28 MED ORDER — BUTALBITAL-APAP-CAFFEINE 50-325-40 MG PO TABS
1.0000 | ORAL_TABLET | Freq: Four times a day (QID) | ORAL | Status: DC | PRN
  Administered 2016-10-28: 1 via ORAL
  Filled 2016-10-28: qty 1

## 2016-10-28 MED ORDER — FUROSEMIDE 10 MG/ML IJ SOLN
INTRAMUSCULAR | Status: AC
  Filled 2016-10-28: qty 4

## 2016-10-28 MED ORDER — POLYETHYLENE GLYCOL 3350 17 G PO PACK: 17.0000 g | PACK | Freq: Every day | ORAL | Status: DC | PRN

## 2016-10-28 MED ORDER — SODIUM CHLORIDE 0.9% FLUSH: 3.0000 mL | INTRAVENOUS | Status: DC | PRN

## 2016-10-28 MED ORDER — IOPAMIDOL (ISOVUE-300) INJECTION 61%
75.0000 mL | Freq: Once | INTRAVENOUS | Status: AC | PRN
  Administered 2016-10-28: 75 mL via INTRAVENOUS

## 2016-10-28 MED ORDER — AMLODIPINE BESYLATE 10 MG PO TABS
10.0000 mg | ORAL_TABLET | Freq: Every day | ORAL | Status: DC
  Administered 2016-10-29 – 2016-10-30 (×2): 10 mg via ORAL
  Filled 2016-10-28 (×2): qty 1

## 2016-10-28 MED ORDER — ENOXAPARIN SODIUM 40 MG/0.4ML ~~LOC~~ SOLN
40.0000 mg | SUBCUTANEOUS | Status: DC
  Administered 2016-10-28 – 2016-10-29 (×2): 40 mg via SUBCUTANEOUS
  Filled 2016-10-28 (×2): qty 0.4

## 2016-10-28 MED ORDER — BISACODYL 10 MG RE SUPP
10.0000 mg | Freq: Every day | RECTAL | Status: DC | PRN
  Filled 2016-10-28: qty 1

## 2016-10-28 MED ORDER — METOPROLOL SUCCINATE ER 25 MG PO TB24: 25.0000 mg | ORAL_TABLET | Freq: Every day | ORAL | Status: DC

## 2016-10-28 MED ORDER — SODIUM CHLORIDE 0.9% FLUSH
3.0000 mL | Freq: Two times a day (BID) | INTRAVENOUS | Status: DC
  Administered 2016-10-28 – 2016-10-29 (×2): 3 mL via INTRAVENOUS

## 2016-10-28 MED ORDER — ALBUTEROL SULFATE (2.5 MG/3ML) 0.083% IN NEBU: 2.5000 mg | INHALATION_SOLUTION | RESPIRATORY_TRACT | Status: DC | PRN

## 2016-10-28 MED ORDER — ASPIRIN 81 MG PO CHEW
81.0000 mg | CHEWABLE_TABLET | Freq: Every day | ORAL | Status: DC
  Administered 2016-10-29 – 2016-10-30 (×2): 81 mg via ORAL
  Filled 2016-10-28 (×2): qty 1

## 2016-10-28 MED ORDER — ONDANSETRON HCL 4 MG PO TABS: 4.0000 mg | ORAL_TABLET | Freq: Four times a day (QID) | ORAL | Status: DC | PRN

## 2016-10-28 MED ORDER — FUROSEMIDE 10 MG/ML IJ SOLN
60.0000 mg | Freq: Two times a day (BID) | INTRAMUSCULAR | Status: DC
  Administered 2016-10-28: 60 mg via INTRAVENOUS
  Filled 2016-10-28: qty 6

## 2016-10-28 MED ORDER — INSULIN ASPART 100 UNIT/ML ~~LOC~~ SOLN
0.0000 [IU] | Freq: Three times a day (TID) | SUBCUTANEOUS | Status: DC
  Administered 2016-10-30: 2 [IU] via SUBCUTANEOUS
  Filled 2016-10-28: qty 2

## 2016-10-28 MED ORDER — ASPIRIN 81 MG PO CHEW
324.0000 mg | CHEWABLE_TABLET | Freq: Once | ORAL | Status: AC
  Administered 2016-10-28: 324 mg via ORAL
  Filled 2016-10-28: qty 4

## 2016-10-28 MED ORDER — SODIUM CHLORIDE 0.9 % IV SOLN: 250.0000 mL | INTRAVENOUS | Status: DC | PRN

## 2016-10-28 MED ORDER — ONDANSETRON HCL 4 MG/2ML IJ SOLN: 4.0000 mg | Freq: Four times a day (QID) | INTRAMUSCULAR | Status: DC | PRN

## 2016-10-28 MED ORDER — INSULIN ASPART 100 UNIT/ML ~~LOC~~ SOLN: 0.0000 [IU] | Freq: Every day | SUBCUTANEOUS | Status: DC

## 2016-10-28 MED ORDER — FUROSEMIDE 10 MG/ML IJ SOLN
60.0000 mg | Freq: Once | INTRAMUSCULAR | Status: AC
  Administered 2016-10-28: 60 mg via INTRAVENOUS
  Filled 2016-10-28: qty 8

## 2016-10-28 MED ORDER — AMIODARONE HCL 200 MG PO TABS
200.0000 mg | ORAL_TABLET | Freq: Two times a day (BID) | ORAL | Status: DC
  Administered 2016-10-28 – 2016-10-30 (×4): 200 mg via ORAL
  Filled 2016-10-28 (×4): qty 1

## 2016-10-28 MED ORDER — HYDROCODONE-ACETAMINOPHEN 5-325 MG PO TABS
1.0000 | ORAL_TABLET | ORAL | Status: DC | PRN
  Administered 2016-10-28 (×2): 1 via ORAL
  Administered 2016-10-28: 2 via ORAL
  Administered 2016-10-29: 1 via ORAL
  Administered 2016-10-29: 2 via ORAL
  Filled 2016-10-28: qty 2
  Filled 2016-10-28: qty 1
  Filled 2016-10-28: qty 2
  Filled 2016-10-28 (×2): qty 1

## 2016-10-28 MED ORDER — SODIUM CHLORIDE 0.9% FLUSH
3.0000 mL | Freq: Two times a day (BID) | INTRAVENOUS | Status: DC
  Administered 2016-10-28 – 2016-10-30 (×4): 3 mL via INTRAVENOUS

## 2016-10-28 MED ORDER — NITROGLYCERIN 2 % TD OINT
1.0000 [in_us] | TOPICAL_OINTMENT | Freq: Four times a day (QID) | TRANSDERMAL | Status: DC
  Administered 2016-10-28 (×2): 1 [in_us] via TOPICAL
  Filled 2016-10-28 (×2): qty 1

## 2016-10-28 MED ORDER — LISINOPRIL 20 MG PO TABS: 20.0000 mg | ORAL_TABLET | Freq: Every day | ORAL | Status: DC

## 2016-10-28 MED ORDER — ACETAMINOPHEN 650 MG RE SUPP: 650.0000 mg | Freq: Four times a day (QID) | RECTAL | Status: DC | PRN

## 2016-10-28 MED ORDER — MORPHINE SULFATE (PF) 4 MG/ML IV SOLN
4.0000 mg | Freq: Once | INTRAVENOUS | Status: AC
  Administered 2016-10-28: 4 mg via INTRAVENOUS
  Filled 2016-10-28: qty 1

## 2016-10-28 MED ORDER — ACETAMINOPHEN 325 MG PO TABS
650.0000 mg | ORAL_TABLET | Freq: Four times a day (QID) | ORAL | Status: DC | PRN
  Administered 2016-10-28: 650 mg via ORAL
  Filled 2016-10-28: qty 2

## 2016-10-28 NOTE — Progress Notes (Signed)
Patient continuing to complain of 10/10 headache, requesting stronger meds. Dr. Darvin Neighbours notified - aware that patient has recently received max dose of norco ordered with no relief. MD placing order for fiorcet - ok to go ahead and give a dose.

## 2016-10-28 NOTE — Progress Notes (Signed)
Patient admitted to unit. Oriented to room, call bell, and staff. Bed in lowest position. Fall safety plan reviewed. Full assessment to Epic. Skin assessment verified with Gildardo Pounds RN. Telemetry box verification with tele clerk and Gerald Stabs NT- Box#: [id number]. Will continue to monitor.

## 2016-10-28 NOTE — H&P (Signed)
[facility] [place] NAME: Gabriel [name]    MR#:  [id number]  DATE OF BIRTH:  03/15/1971  DATE OF ADMISSION:  10/28/2016  PRIMARY CARE PHYSICIAN: No PCP Per Patient   REQUESTING/REFERRING PHYSICIAN: Dr. Karma Greaser  CHIEF COMPLAINT:   Chief Complaint  Patient presents with  . Shortness of Breath    HISTORY OF PRESENT ILLNESS:  Gabriel [name]  is a 46 y.o. male with a known history of COPD, systolic CHF with ejection fraction 35%, CK D stage III, hypertension, diabetes mellitus presents to the emergency room with complaints of progressively worsening shortness of breath and lower extremity edema 1 week. He has noticed worsening orthopnea. Here in the emergency room patient is severely tachypneic although not needing oxygen. Chest x-ray and CT scan of the chest were done which showed small pleural effusions with congestive heart failure. Patient will just him on a salt in his diet but drinks plenty of fluids as directed by his doctor. Checks his weight occasionally. His baseline weight seems to be on 2:30 pounds and was 250 pounds 2 days back. Is on Lasix 40 mg daily. He also complains of some chest tightness.  PAST MEDICAL HISTORY:   Past Medical History:  Diagnosis Date  . Asthma   . Cardiomyopathy ([place])   . CHF (congestive heart failure) ([place])   . COPD (chronic obstructive pulmonary disease) ([place])   . Diabetes mellitus without complication ([place])    Pre diabetic  . Hypertension    Resolved since weight loss  . Obesity (BMI 30.0-34.9)   . Psoriasis   . Systolic CHF ([name])     PAST SURGICAL HISTORY:   Past Surgical History:  Procedure Laterality Date  . AMPUTATION    . FINGER AMPUTATION     Traumatic    SOCIAL HISTORY:   Social History  Substance Use Topics  . Smoking status: Current Some Day Smoker    Packs/day: 0.00    Years: 33.00    Types: Cigarettes  . Smokeless tobacco: Never Used  . Alcohol use 1.8 oz/week    3 Cans  of beer per week     Comment: occassionally    FAMILY HISTORY:   Family History  Problem Relation Age of Onset  . Diabetes Mellitus II Mother   . Hypertension Father   . Cancer Paternal Aunt   . Cancer Maternal Grandfather     DRUG ALLERGIES:   Allergies  Allergen Reactions  . Prednisone Other (See Comments)    Reaction: Hallucinations     REVIEW OF SYSTEMS:   Review of Systems  Constitutional: Positive for malaise/fatigue. Negative for chills, fever and weight loss.  HENT: Negative for hearing loss and nosebleeds.   Eyes: Negative for blurred vision, double vision and pain.  Respiratory: Positive for cough and shortness of breath. Negative for hemoptysis, sputum production and wheezing.   Cardiovascular: Positive for chest pain, orthopnea and leg swelling. Negative for palpitations.  Gastrointestinal: Negative for abdominal pain, constipation, diarrhea, nausea and vomiting.  Genitourinary: Negative for dysuria and hematuria.  Musculoskeletal: Negative for back pain, falls and myalgias.  Skin: Negative for rash.  Neurological: Positive for weakness. Negative for dizziness, tremors, sensory change, speech change, focal weakness, seizures and headaches.  Endo/Heme/Allergies: Does not bruise/bleed easily.  Psychiatric/Behavioral: Negative for depression and memory loss. The patient is not nervous/anxious.     MEDICATIONS AT HOME:   Prior to Admission medications   Medication Sig Start Date End Date Taking?  Authorizing Provider  amiodarone (PACERONE) 200 MG tablet Take 1 tablet (200 mg total) by mouth 2 (two) times daily. 09/19/16  Yes Earleen Newport, MD  amLODipine (NORVASC) 10 MG tablet Take 1 tablet (10 mg total) by mouth daily. 09/19/16  Yes Earleen Newport, MD  aspirin 81 MG chewable tablet Chew 1 tablet (81 mg total) by mouth daily. Reported on 02/08/2016 09/19/16  Yes Earleen Newport, MD  furosemide (LASIX) 40 MG tablet Take 1 tablet (40 mg total) by mouth  every other day. 09/19/16 09/19/17 Yes Earleen Newport, MD  HYDROcodone-acetaminophen (NORCO/VICODIN) 5-325 MG tablet Take 1 tablet by mouth every 6 (six) hours as needed for severe pain. 09/19/16  Yes Earleen Newport, MD  lisinopril (PRINIVIL,ZESTRIL) 20 MG tablet Take 1 tablet (20 mg total) by mouth daily. 09/19/16  Yes Earleen Newport, MD  metoprolol succinate (TOPROL-XL) 25 MG 24 hr tablet Take 1 tablet (25 mg total) by mouth daily. Take with or immediately following a meal. 09/19/16  Yes Earleen Newport, MD  levofloxacin (LEVAQUIN) 500 MG tablet Take 1 tablet (500 mg total) by mouth daily. Patient not taking: Reported on 09/19/2016 03/16/16   Hillary Bow, MD     VITAL SIGNS:  Blood pressure (!) 163/118, pulse 74, temperature 97.6 F (36.4 C), temperature source Oral, resp. rate 15, height 6' (1.829 m), weight 113.4 kg (250 lb), SpO2 97 %.  PHYSICAL EXAMINATION:  Physical Exam  GENERAL:  46 y.o.-year-old patient lying in the bed with no acute distress.  EYES: Pupils equal, round, reactive to light and accommodation. No scleral icterus. Extraocular muscles intact.  HEENT: Head atraumatic, normocephalic. Oropharynx and nasopharynx clear. No oropharyngeal erythema, moist oral mucosa  NECK:  Supple, no jugular venous distention. No thyroid enlargement, no tenderness.  LUNGS: Increased work of breathing. Bibasilar crackles. Lower extremity edema positive.  CARDIOVASCULAR: S1, S2 normal. No murmurs, rubs, or gallops.  ABDOMEN: Soft, nontender, nondistended. Bowel sounds present. No organomegaly or mass.  EXTREMITIES: No  cyanosis, or clubbing. + 2 pedal & radial pulses b/l.   NEUROLOGIC: Cranial nerves II through XII are intact. No focal Motor or sensory deficits appreciated b/l PSYCHIATRIC: The patient is alert and oriented x 3. Good affect.  SKIN: No obvious rash, lesion, or ulcer.   LABORATORY PANEL:   CBC  Recent Labs Lab 10/28/16 1041  WBC 10.3  HGB 15.2  HCT 45.5  PLT  169   ------------------------------------------------------------------------------------------------------------------  Chemistries   Recent Labs Lab 10/28/16 1041  NA 139  K 3.8  CL 108  CO2 25  GLUCOSE 114*  BUN 18  CREATININE 1.10  CALCIUM 8.6*   ------------------------------------------------------------------------------------------------------------------  Cardiac Enzymes  Recent Labs Lab 10/28/16 1041  TROPONINI 0.08*   ------------------------------------------------------------------------------------------------------------------  RADIOLOGY:  Dg Chest 2 View  Result Date: 10/28/2016 CLINICAL DATA:  Shortness of breath since Friday, nonproductive cough EXAM: CHEST  2 VIEW COMPARISON:  09/19/2016 FINDINGS: Stable mild cardiomegaly. There is peribronchial thickening and increased interstitial markings throughout the lungs, slightly increasing since prior study. This could represent bronchitis or interstitial edema. No effusions. No acute bony abnormality. IMPRESSION: Continued peribronchial thickening. Increasing interstitial prominence could reflect worsening bronchitis and/or interstitial edema/CHF. Electronically Signed   By: Rolm Baptise M.D.   On: 10/28/2016 10:49   Ct Chest W Contrast  Result Date: 10/28/2016 CLINICAL DATA:  Cough and shortness of breath since Friday. Symptoms since cleaning house with bleach. EXAM: CT CHEST WITH CONTRAST TECHNIQUE: Multidetector CT imaging of the chest  was performed during intravenous contrast administration. CONTRAST:  73mL ISOVUE-300 IOPAMIDOL (ISOVUE-300) INJECTION 61% COMPARISON:  02/15/2016 FINDINGS: Cardiovascular: Mild cardiomegaly. No pericardial effusion. Extensive atherosclerosis for age, including the coronaries. No acute vascular finding Mediastinum/Nodes: Chronic mediastinal adenopathy. Right paratracheal lymph node measures 16 mm short axis. Sub- carina node eccentric to the right measures 2 cm short axis. No  progression calcification. Lungs/Pleura: Small bilateral layering pleural effusion and mild septal thickening. These findings have been seen previously. Minimal atelectasis. No air bronchogram. Mild mosaic attenuation the lungs concha often from air trapping. No definitive alveolitis to correlate with the history. Upper Abdomen: No acute abnormality. Musculoskeletal: No acute finding IMPRESSION: 1. Small layering pleural effusions and mild septal thickening, consider CHF. 2. Age advanced coronary atherosclerosis. 3. Chronic mediastinal adenopathy, stable since at least 2016. Electronically Signed   By: Monte Fantasia M.D.   On: 10/28/2016 12:03     IMPRESSION AND PLAN:   * Acute on chronic systolic CHF with significant shortness of breath and lower extremity edema.  - IV Lasix, Beta blockers - Input and Output - Counseled to limit fluids and Salt - Monitor Bun/Cr and Potassium - Echo -Cardiology follow up after discharge  * Chest pain with mild elevation in troponin. Patient does have chronic elevation in troponin which seems stable at this time. We will repeat troponin. He was seen by cardiology for the same last year and no further workup was recommended. Will need outpatient follow-up with [facility] cardiology. We will request cardiology see the patient in the hospital if there is further elevation in troponin.  * Hypertension. Continue home medications.  * Diabetes mellitus type 2. Home medications and sliding scale insulin.  All the records are reviewed and case discussed with ED provider. Management plans discussed with the patient, family and they are in agreement.  CODE STATUS: FULL CODE  TOTAL TIME TAKING CARE OF THIS PATIENT: 40 minutes.   Hillary Bow R M.D on 10/28/2016 at 1:59 PM  Between 7am to 6pm - Pager - [phone]  After 6pm go to [email or web address] - password [email or web address] [facility]  Office  [phone]  CC: Primary care  physician; No PCP Per Patient  Note: This dictation was prepared with Dragon dictation along with smaller phrase technology. Any transcriptional errors that result from this process are unintentional.

## 2016-10-28 NOTE — ED Triage Notes (Signed)
Patient presents to the ED with dry cough and shortness of breath that began Friday after cleaning his house with bleach.  Patient reports extensive history of pneumonia and reports he feels the same.  Patient is in no obvious distress at this time.

## 2016-10-28 NOTE — ED Provider Notes (Signed)
[facility] Emergency Department Provider Note  Time seen: 10:48 AM  I have reviewed the triage vital signs and the nursing notes.   HISTORY  Chief Complaint Shortness of Breath    HPI Gabriel N Keefer Soulliere. is a 46 y.o. male with a past medical history of CHF, COPD, hypertension, diabetes, presents to the emergency department for difficulty breathing and cough. According to the patient for the past 3 days he has been coughing with no sputum production. Denies any fever. States he is also been feeling short of breath for the past 3 days. Patient does smoke cigarettes. States no formal COPD diagnosis but he suspects he has COPD. Does not use breathing treatments. Patient states he has been diagnosed with pneumonia 7 times in the past 10 months. States every time he goes on antibiotics and then feels better.  Past Medical History:  Diagnosis Date  . Cardiomyopathy ([place])   . CHF (congestive heart failure) ([place])   . COPD (chronic obstructive pulmonary disease) ([place])   . Diabetes mellitus without complication ([place])    Pre diabetic  . Hypertension    Resolved since weight loss  . Obesity (BMI 30.0-34.9)   . Psoriasis   . Systolic CHF [facility])     Patient Active Problem List   Diagnosis Date Noted  . HCAP (healthcare-associated pneumonia) 03/15/2016  . Coronary artery disease, non-occlusive 03/15/2016  . Acute respiratory distress 03/15/2016  . Chest pain 02/16/2016  . Orthostatic hypotension 01/23/2016  . Diarrhea 01/23/2016  . Hypokalemia 01/23/2016  . Hypomagnesemia 01/23/2016  . Chronic systolic CHF (congestive heart failure) ([place]) 01/23/2016  . Syncope 01/22/2016  . Congestive dilated cardiomyopathy ([place]) 01/17/2016  . NSVT (nonsustained ventricular tachycardia) ([place])   . 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 CAP (community acquired pneumonia)   . Arterial hypotension   . Elevated troponin   . Chronic systolic heart failure ([place])   . Sepsis ([place])  01/03/2016  . Acute on chronic combined systolic and diastolic CHF (congestive heart failure) ([place]) 05/17/2015  . Type II diabetes mellitus ([place]) 05/17/2015  . Hyperlipidemia 05/17/2015  . COPD (chronic obstructive pulmonary disease) ([place]) 05/17/2015  . HTN (hypertension) 03/31/2015  . Smoking 03/31/2015    Past Surgical History:  Procedure Laterality Date  . AMPUTATION    . FINGER AMPUTATION     Traumatic    Prior to Admission medications   Medication Sig Start Date End Date Taking? Authorizing Provider  amiodarone (PACERONE) 200 MG tablet Take 1 tablet (200 mg total) by mouth 2 (two) times daily. 09/19/16   Earleen Newport, MD  amLODipine (NORVASC) 10 MG tablet Take 1 tablet (10 mg total) by mouth daily. 09/19/16   Earleen Newport, MD  aspirin 81 MG chewable tablet Chew 1 tablet (81 mg total) by mouth daily. Reported on 02/08/2016 09/19/16   Earleen Newport, MD  furosemide (LASIX) 40 MG tablet Take 1 tablet (40 mg total) by mouth every other day. 09/19/16 09/19/17  Earleen Newport, MD  HYDROcodone-acetaminophen (NORCO/VICODIN) 5-325 MG tablet Take 1 tablet by mouth every 6 (six) hours as needed for severe pain. 09/19/16   Earleen Newport, MD  levofloxacin (LEVAQUIN) 500 MG tablet Take 1 tablet (500 mg total) by mouth daily. Patient not taking: Reported on 09/19/2016 03/16/16   Hillary Bow, MD  lisinopril (PRINIVIL,ZESTRIL) 20 MG tablet Take 1 tablet (20 mg total) by mouth daily. 09/19/16   Earleen Newport, MD  metoprolol succinate (TOPROL-XL) 25 MG  24 hr tablet Take 1 tablet (25 mg total) by mouth daily. Take with or immediately following a meal. 09/19/16   Earleen Newport, MD    Allergies  Allergen Reactions  . Prednisone Other (See Comments)    Reaction: Hallucinations     Family History  Problem Relation Age of Onset  . Diabetes Mellitus II Mother   . Hypertension Father   . Cancer Paternal Aunt   . Cancer Maternal Grandfather     Social History Social  History  Substance Use Topics  . Smoking status: Current Some Day Smoker    Packs/day: 0.00    Years: 33.00    Types: Cigarettes  . Smokeless tobacco: Never Used  . Alcohol use 1.8 oz/week    3 Cans of beer per week     Comment: occassionally    Review of Systems Constitutional: Negative for fever. Cardiovascular: Negative for chest pain. Respiratory: Positive for shortness of breath. Positive for cough. Gastrointestinal: Negative for abdominal pain, vomiting and diarrhea. Neurological: Negative for headache 10-point ROS otherwise negative.  ____________________________________________   PHYSICAL EXAM:  VITAL SIGNS: ED Triage Vitals  Enc Vitals Group     BP 10/28/16 1009 (!) 185/136     Pulse Rate 10/28/16 1009 84     Resp 10/28/16 1009 (!) 26     Temp 10/28/16 1009 97.6 F (36.4 C)     Temp Source 10/28/16 1009 Oral     SpO2 10/28/16 1009 100 %     Weight 10/28/16 1010 250 lb (113.4 kg)     Height 10/28/16 1010 6' (1.829 m)     Head Circumference --      Peak Flow --      Pain Score 10/28/16 1010 8     Pain Loc --      Pain Edu? --      Excl. in [place]? --     Constitutional: Alert and oriented. Well appearing and in no distress. Eyes: Normal exam ENT   Head: Normocephalic and atraumatic.   Mouth/Throat: Mucous membranes are moist. Cardiovascular: Normal rate, regular rhythm. No murmur Respiratory: Mild tachypnea but no distress. Breath sounds are clear. No obvious wheezes rales or rhonchi. Gastrointestinal: Soft and nontender. No distention.   Musculoskeletal: Nontender with normal range of motion in all extremities. No lower extremity tenderness or edema. Neurologic:  Normal speech and language. No gross focal neurologic deficits  Skin:  Skin is warm, dry and intact.  Psychiatric: Mood and affect are normal.   ____________________________________________    EKG  EKG reviewed and interpreted by myself shows sinus rhythm at 88 bpm, slightly widened  QRS with normal axis, large and normal intervals besides a slightly prolonged QTC of 505 ms, no concerning ST changes.  ____________________________________________    RADIOLOGY  Chest x-ray shows continued peribronchial thickening interstitial prominence possible interstitial edema/CHF.  CT scan shows layering pleural effusions with septal thickening consistent with CHF.  ____________________________________________   INITIAL IMPRESSION / ASSESSMENT AND PLAN / ED COURSE  Pertinent labs & imaging results that were available during my care of the patient were reviewed by me and considered in my medical decision making (see chart for details).  The patient presents to the emergency department with cough and shortness of breath over the past 3 days. We will check labs, chest x-ray and closely monitor. Overall the patient appears well, minimal tachypnea, clear lung sounds without wheezes rales or rhonchi.  Patient continues with chest pain in the emergency department. States  shortness breath has worsened. Patient's blood pressure currently 163/118, room air saturation between 92-95 percent. Given the patient's continued chest pain with shortness of breath and imaging changes consistent with CHF we will dose IV Lasix, pain medication, nitroglycerin ointment and admit to the hospital.  ____________________________________________   FINAL CLINICAL IMPRESSION(S) / ED DIAGNOSES  Dyspnea chf exacerbation   Harvest Dark, MD 10/28/16 1304

## 2016-10-29 LAB — BASIC METABOLIC PANEL
Anion gap: 9 (ref 5–15)
BUN: 23 mg/dL — ABNORMAL HIGH (ref 6–20)
CO2: 27 mmol/L (ref 22–32)
Calcium: 8.7 mg/dL — ABNORMAL LOW (ref 8.9–10.3)
Chloride: 103 mmol/L (ref 101–111)
Creatinine, Ser: 1.13 mg/dL (ref 0.61–1.24)
GFR calc Af Amer: >60 mL/min (ref >60)
GFR calc non Af Amer: >60 mL/min (ref >60)
Glucose, Bld: 113 mg/dL — ABNORMAL HIGH (ref 65–99)
Potassium: 3.5 mmol/L (ref 3.5–5.1)
Sodium: 139 mmol/L (ref 135–145)

## 2016-10-29 LAB — GLUCOSE, CAPILLARY
Glucose-Capillary: 106 mg/dL — ABNORMAL HIGH (ref 65–99)
Glucose-Capillary: 123 mg/dL — ABNORMAL HIGH (ref 65–99)
Glucose-Capillary: 119 mg/dL — ABNORMAL HIGH (ref 65–99)
Glucose-Capillary: 146 mg/dL — ABNORMAL HIGH (ref 65–99)

## 2016-10-29 LAB — TROPONIN I: Troponin I: 0.08 ng/mL (ref <0.03)

## 2016-10-29 LAB — MAGNESIUM: Magnesium: 1.8 mg/dL (ref 1.7–2.4)

## 2016-10-29 MED ORDER — LISINOPRIL 20 MG PO TABS
20.0000 mg | ORAL_TABLET | Freq: Every day | ORAL | Status: DC
  Administered 2016-10-29 – 2016-10-30 (×2): 20 mg via ORAL
  Filled 2016-10-29 (×2): qty 1

## 2016-10-29 MED ORDER — FUROSEMIDE 10 MG/ML IJ SOLN
40.0000 mg | Freq: Two times a day (BID) | INTRAMUSCULAR | Status: DC
  Administered 2016-10-29 – 2016-10-30 (×3): 40 mg via INTRAVENOUS
  Filled 2016-10-29 (×3): qty 4

## 2016-10-29 MED ORDER — POTASSIUM CHLORIDE CRYS ER 20 MEQ PO TBCR
20.0000 meq | EXTENDED_RELEASE_TABLET | Freq: Every day | ORAL | Status: DC
  Administered 2016-10-29 – 2016-10-30 (×2): 20 meq via ORAL
  Filled 2016-10-29 (×2): qty 1

## 2016-10-29 MED ORDER — METOPROLOL SUCCINATE ER 50 MG PO TB24
50.0000 mg | ORAL_TABLET | Freq: Every day | ORAL | Status: DC
  Administered 2016-10-29 – 2016-10-30 (×2): 50 mg via ORAL
  Filled 2016-10-29 (×2): qty 1

## 2016-10-29 MED ORDER — LISINOPRIL 20 MG PO TABS: 40.0000 mg | ORAL_TABLET | Freq: Every day | ORAL | Status: DC

## 2016-10-29 NOTE — Progress Notes (Signed)
Follow-up appointment scheduled at the [facility] on November 06, 2016 at 12:20pm. Of note, he has cancelled/not shown for 4 previously scheduled appointments. Thank you for the referral.

## 2016-10-29 NOTE — Care Management Note (Addendum)
Case Management Note  Patient Details  Name: Talbert Trembath. MRN: [id number] Date of Birth: 04/06/1971  Subjective/Objective:             Patient without insurance or PCP admitted with CHF.  Referral made to HF Clinic and provided patient with [facility] applications along with [employer] four dollar list.  Says that CHF is not a new diagnosis and he has access to scales.  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 due to "so much going on."    When questioned further says that his 36 year old son has been "real sick."  Discussed at length the benefit of completing the application for [facility]    Action/Plan:   Expected Discharge Date:                  Expected Discharge Plan:  Home/Self Care  In-House Referral:  Financial Counselor  Discharge planning Services  CM Consult, HF Clinic, [facility], Medication Assistance  Post Acute Care Choice:    Choice offered to:     DME Arranged:    DME Agency:     HH Arranged:    [place] Agency:     Status of Service:  In process, will continue to follow  If discussed at Long Length of Stay Meetings, dates discussed:    Additional Comments:  Katrina Stack, RN 10/29/2016, 8:59 AM

## 2016-10-29 NOTE — Progress Notes (Signed)
Dr. Posey Pronto notified of runs of wide QRS. Patient asymptomatic, asleep.

## 2016-10-29 NOTE — Progress Notes (Signed)
[place] at [facility]                                                                                                                                                                                  Patient Demographics   Gabriel [name], is a 46 y.o. male, DOB - 06/22/1971, BJY:[id number]  Admit date - 10/28/2016   Admitting Physician Hillary Bow, MD  Outpatient Primary MD for the patient is No PCP Per Patient   LOS - 1  Subjective: Patient's breathing is improved. His urinated 3 L since admission    Review of Systems:   CONSTITUTIONAL: No documented fever. No fatigue, weakness. No weight gain, no weight loss.  EYES: No blurry or double vision.  ENT: No tinnitus. No postnasal drip. No redness of the oropharynx.  RESPIRATORY: No cough, no wheeze, no hemoptysis.  Positive dyspnea.  CARDIOVASCULAR: No chest pain. No orthopnea. No palpitations. No syncope.  GASTROINTESTINAL: No nausea, no vomiting or diarrhea. No abdominal pain. No melena or hematochezia.  GENITOURINARY: No dysuria or hematuria.  ENDOCRINE: No polyuria or nocturia. No heat or cold intolerance.  HEMATOLOGY: No anemia. No bruising. No bleeding.  INTEGUMENTARY: No rashes. No lesions.  MUSCULOSKELETAL: No arthritis. No swelling. No gout.  NEUROLOGIC: No numbness, tingling, or ataxia. No seizure-type activity.  PSYCHIATRIC: No anxiety. No insomnia. No ADD.    Vitals:   Vitals:   10/28/16 2051 10/29/16 0451 10/29/16 0943 10/29/16 1204  BP: (!) 140/91 (!) 144/87 (!) 158/90 (!) 156/97  Pulse: 81 71 72 83  Resp: 16 18  18   Temp: 97.7 F (36.5 C)   98.5 F (36.9 C)  TempSrc:    Oral  SpO2: 97% 93%  94%  Weight:  248 lb 11.2 oz (112.8 kg)    Height:        Wt Readings from Last 3 Encounters:  10/29/16 248 lb 11.2 oz (112.8 kg)  09/19/16 245 lb (111.1 kg)  05/23/16 240 lb (108.9 kg)     Intake/Output Summary (Last 24 hours) at 10/29/16 1316 Last data filed at 10/29/16 1042   Gross per 24 hour  Intake              480 ml  Output             4050 ml  Net            -3570 ml    Physical Exam:   GENERAL: Pleasant-appearing in no apparent distress.  HEAD, EYES, EARS, NOSE AND THROAT: Atraumatic, normocephalic. Extraocular muscles are intact. Pupils equal and reactive to light. Sclerae anicteric. No conjunctival injection. No oro-pharyngeal erythema.  NECK: Supple. There  is no jugular venous distention. No bruits, no lymphadenopathy, no thyromegaly.  HEART: Regular rate and rhythm,. No murmurs, no rubs, no clicks.  LUNGS: Occasional crackle No rales or rhonchi. No wheezes.  ABDOMEN: Soft, flat, nontender, nondistended. Has good bowel sounds. No hepatosplenomegaly appreciated.  EXTREMITIES: No evidence of any cyanosis, clubbing, or peripheral edema.  +2 pedal and radial pulses bilaterally.  NEUROLOGIC: The patient is alert, awake, and oriented x3 with no focal motor or sensory deficits appreciated bilaterally.  SKIN: Moist and warm with no rashes appreciated.  Psych: Not anxious, depressed LN: No inguinal LN enlargement    Antibiotics   Anti-infectives    None      Medications   Scheduled Meds: . amiodarone  200 mg Oral BID  . amLODipine  10 mg Oral Daily  . aspirin  81 mg Oral Daily  . enoxaparin (LOVENOX) injection  40 mg Subcutaneous Q24H  . furosemide  40 mg Intravenous BID  . insulin aspart  0-15 Units Subcutaneous TID WC  . insulin aspart  0-5 Units Subcutaneous QHS  . lisinopril  20 mg Oral Daily  . metoprolol succinate  50 mg Oral Daily  . nitroGLYCERIN  1 inch Topical Q6H  . potassium chloride  20 mEq Oral Daily  . sodium chloride flush  3 mL Intravenous Q12H  . sodium chloride flush  3 mL Intravenous Q12H   Continuous Infusions: PRN Meds:.sodium chloride, acetaminophen **OR** acetaminophen, albuterol, bisacodyl, butalbital-acetaminophen-caffeine, HYDROcodone-acetaminophen, ondansetron **OR** ondansetron (ZOFRAN) IV, polyethylene glycol,  sodium chloride flush   Data Review:   Micro Results No results found for this or any previous visit (from the past 240 hour(s)).  Radiology Reports Dg Chest 2 View  Result Date: 10/28/2016 CLINICAL DATA:  Shortness of breath since Friday, nonproductive cough EXAM: CHEST  2 VIEW COMPARISON:  09/19/2016 FINDINGS: Stable mild cardiomegaly. There is peribronchial thickening and increased interstitial markings throughout the lungs, slightly increasing since prior study. This could represent bronchitis or interstitial edema. No effusions. No acute bony abnormality. IMPRESSION: Continued peribronchial thickening. Increasing interstitial prominence could reflect worsening bronchitis and/or interstitial edema/CHF. Electronically Signed   By: Rolm Baptise M.D.   On: 10/28/2016 10:49   Ct Chest W Contrast  Result Date: 10/28/2016 CLINICAL DATA:  Cough and shortness of breath since Friday. Symptoms since cleaning house with bleach. EXAM: CT CHEST WITH CONTRAST TECHNIQUE: Multidetector CT imaging of the chest was performed during intravenous contrast administration. CONTRAST:  7mL ISOVUE-300 IOPAMIDOL (ISOVUE-300) INJECTION 61% COMPARISON:  02/15/2016 FINDINGS: Cardiovascular: Mild cardiomegaly. No pericardial effusion. Extensive atherosclerosis for age, including the coronaries. No acute vascular finding Mediastinum/Nodes: Chronic mediastinal adenopathy. Right paratracheal lymph node measures 16 mm short axis. Sub- carina node eccentric to the right measures 2 cm short axis. No progression calcification. Lungs/Pleura: Small bilateral layering pleural effusion and mild septal thickening. These findings have been seen previously. Minimal atelectasis. No air bronchogram. Mild mosaic attenuation the lungs concha often from air trapping. No definitive alveolitis to correlate with the history. Upper Abdomen: No acute abnormality. Musculoskeletal: No acute finding IMPRESSION: 1. Small layering pleural effusions and  mild septal thickening, consider CHF. 2. Age advanced coronary atherosclerosis. 3. Chronic mediastinal adenopathy, stable since at least 2016. Electronically Signed   By: Monte Fantasia M.D.   On: 10/28/2016 12:03     CBC  Recent Labs Lab 10/28/16 1041  WBC 10.3  HGB 15.2  HCT 45.5  PLT 169  MCV 80.2  MCH 26.7  MCHC 33.3  RDW 16.5*  Chemistries   Recent Labs Lab 10/28/16 1041 10/29/16 0419  NA 139 139  K 3.8 3.5  CL 108 103  CO2 25 27  GLUCOSE 114* 113*  BUN 18 23*  CREATININE 1.10 1.13  CALCIUM 8.6* 8.7*  MG  --  1.8   ------------------------------------------------------------------------------------------------------------------ estimated creatinine clearance is 104.8 mL/min (by C-G formula based on SCr of 1.13 mg/dL). ------------------------------------------------------------------------------------------------------------------ No results for input(s): HGBA1C in the last 72 hours. ------------------------------------------------------------------------------------------------------------------ No results for input(s): CHOL, HDL, LDLCALC, TRIG, CHOLHDL, LDLDIRECT in the last 72 hours. ------------------------------------------------------------------------------------------------------------------ No results for input(s): TSH, T4TOTAL, T3FREE, THYROIDAB in the last 72 hours.  Invalid input(s): FREET3 ------------------------------------------------------------------------------------------------------------------ No results for input(s): VITAMINB12, FOLATE, FERRITIN, TIBC, IRON, RETICCTPCT in the last 72 hours.  Coagulation profile No results for input(s): INR, PROTIME in the last 168 hours.  No results for input(s): DDIMER in the last 72 hours.  Cardiac Enzymes  Recent Labs Lab 10/28/16 1041 10/28/16 1807 10/28/16 2351  TROPONINI 0.08* 0.07* 0.08*    ------------------------------------------------------------------------------------------------------------------ Invalid input(s): POCBNP    Assessment & Plan   * Acute on chronic systolic CHF with significant shortness of breath and lower extremity edema.  Patient's breathing is improved with diuresis I will decrease the dose of the IV Lasix   * Chest pain with mild elevation in troponin.  Chest pain due to acute CHF Troponin borderline elevated we will need outpatient cardiology follow-up  * Hypertension. Continue Norvasc and lisinopril  * Diabetes mellitus type 2. Home medications and sliding scale insulin.     Code Status Orders        Start     Ordered   10/28/16 1358  Full code  Continuous     10/28/16 1358    Code Status History    Date Active Date Inactive Code Status Order ID Comments User Context   03/15/2016  7:58 AM 03/16/2016  3:08 PM Full Code [id number]  Harrie Foreman, MD Inpatient   02/16/2016  2:24 AM 02/17/2016  1:02 PM Full Code [id number]  Harrie Foreman, MD Inpatient   01/22/2016 11:51 PM 01/23/2016  4:41 PM Full Code [id number]  Demetrios Loll, MD ED   01/03/2016  2:22 PM 01/09/2016  2:54 PM Full Code [id number]  Lytle Butte, MD ED   05/17/2015 11:41 PM 05/19/2015  5:31 PM Full Code [id number]  Lance Coon, MD Inpatient   03/20/2015  8:26 AM 03/21/2015  3:27 PM Full Code [id number]  Harrie Foreman, MD Inpatient           Consults  none  DVT Prophylaxis  Lovenox   Lab Results  Component Value Date   PLT 169 10/28/2016     Time Spent in minutes 26min Greater than 50% of time spent in care coordination and counseling patient regarding the condition and plan of care.   Dustin Flock M.D on 10/29/2016 at 1:16 PM  Between 7am to 6pm - Pager - [phone]  After 6pm go to [email or web address] - password [email or web address] [place]   Office  [phone]

## 2016-10-29 NOTE — Plan of Care (Signed)
Problem: Safety: Goal: Ability to remain free from injury will improve Outcome: Progressing Independent in room, steady. Will continue to monitor.

## 2016-10-30 ENCOUNTER — Telehealth: Payer: Self-pay | Admitting: Cardiovascular Disease

## 2016-10-30 LAB — BASIC METABOLIC PANEL
Anion gap: 8 (ref 5–15)
BUN: 31 mg/dL — ABNORMAL HIGH (ref 6–20)
CO2: 28 mmol/L (ref 22–32)
Calcium: 8.8 mg/dL — ABNORMAL LOW (ref 8.9–10.3)
Chloride: 104 mmol/L (ref 101–111)
Creatinine, Ser: 1.15 mg/dL (ref 0.61–1.24)
GFR calc Af Amer: >60 mL/min (ref >60)
GFR calc non Af Amer: >60 mL/min (ref >60)
Glucose, Bld: 113 mg/dL — ABNORMAL HIGH (ref 65–99)
Potassium: 3.5 mmol/L (ref 3.5–5.1)
Sodium: 140 mmol/L (ref 135–145)

## 2016-10-30 LAB — GLUCOSE, CAPILLARY: Glucose-Capillary: 122 mg/dL — ABNORMAL HIGH (ref 65–99)

## 2016-10-30 LAB — HEMOGLOBIN A1C
Hgb A1c MFr Bld: 6.1 % — ABNORMAL HIGH (ref 4.8–5.6)
Mean Plasma Glucose: 128 mg/dL

## 2016-10-30 MED ORDER — FUROSEMIDE 40 MG PO TABS: 40.0000 mg | ORAL_TABLET | Freq: Every day | ORAL | 2 refills | Status: DC

## 2016-10-30 MED ORDER — POTASSIUM CHLORIDE CRYS ER 20 MEQ PO TBCR: 10.0000 meq | EXTENDED_RELEASE_TABLET | Freq: Every day | ORAL | 0 refills | Status: DC

## 2016-10-30 MED ORDER — METOPROLOL SUCCINATE ER 25 MG PO TB24: 50.0000 mg | ORAL_TABLET | Freq: Every day | ORAL | 6 refills | Status: DC

## 2016-10-30 NOTE — Telephone Encounter (Signed)
Pt does not have contact # to call.

## 2016-10-30 NOTE — Telephone Encounter (Signed)
Tcm.. Pt is to come back in 2w His apportionment is on  11/14/16 at 10:20am Pt is already established with Dr Rockey Situ prior to this visit

## 2016-10-30 NOTE — Progress Notes (Signed)
Patient discharged home as ordered,instructions explained and well understood,follow up appointments made,vital signs within normal limits,escorted by spouse and volunteer via wheel chair.

## 2016-10-30 NOTE — Discharge Summary (Signed)
Gabriel [name] at [facility] 46 y.o., DOB [birth year], MRN [id number]. Admission date: 10/28/2016 Discharge Date 10/30/2016 Primary MD No PCP Per Patient Admitting Physician Gabriel Bow, MD  Admission Diagnosis  SOB (shortness of breath) [R06.02] Congestive heart failure, unspecified congestive heart failure chronicity, unspecified congestive heart failure type ([place]) [I50.9]  Discharge Diagnosis   Active Problems:   Acute on chronic systolic CHF (congestive heart failure) (HCC)  Chest pain with minimally elevated troponin due to CHF Essential hypertension Diabetes type 2 Suspected men noncompliant Chronic kidney disease stage III       Hospital Course  Ratio is 46 year old with known history of COPD, systolic CHF acute kidney disease stage III: Central hypertension and diabetes who presented to the emergency room with complaint of shortness of breath and swelling of the lower extremity. As well as chest pressure. He was noted to have exasperation of his systolic CHF. He was admitted and treated with IV Lasix. His shortness of breath subsequently resolved. He was noted to have slightly abnormal troponin with chest pain due to CHF but his chest pain resolved. He'll need outpatient cardiology follow-up. He has had a stress test in 2016.           Consults  None  Significant Tests:  See full reports for all details     Dg Chest 2 View  Result Date: 10/28/2016 CLINICAL DATA:  Shortness of breath since Friday, nonproductive cough EXAM: CHEST  2 VIEW COMPARISON:  09/19/2016 FINDINGS: Stable mild cardiomegaly. There is peribronchial thickening and increased interstitial markings throughout the lungs, slightly increasing since prior study. This could represent bronchitis or interstitial edema. No effusions. No acute bony abnormality. IMPRESSION: Continued peribronchial thickening. Increasing interstitial prominence could reflect worsening  bronchitis and/or interstitial edema/CHF. Electronically Signed   By: Rolm Baptise M.D.   On: 10/28/2016 10:49   Ct Chest W Contrast  Result Date: 10/28/2016 CLINICAL DATA:  Cough and shortness of breath since Friday. Symptoms since cleaning house with bleach. EXAM: CT CHEST WITH CONTRAST TECHNIQUE: Multidetector CT imaging of the chest was performed during intravenous contrast administration. CONTRAST:  52mL ISOVUE-300 IOPAMIDOL (ISOVUE-300) INJECTION 61% COMPARISON:  02/15/2016 FINDINGS: Cardiovascular: Mild cardiomegaly. No pericardial effusion. Extensive atherosclerosis for age, including the coronaries. No acute vascular finding Mediastinum/Nodes: Chronic mediastinal adenopathy. Right paratracheal lymph node measures 16 mm short axis. Sub- carina node eccentric to the right measures 2 cm short axis. No progression calcification. Lungs/Pleura: Small bilateral layering pleural effusion and mild septal thickening. These findings have been seen previously. Minimal atelectasis. No air bronchogram. Mild mosaic attenuation the lungs concha often from air trapping. No definitive alveolitis to correlate with the history. Upper Abdomen: No acute abnormality. Musculoskeletal: No acute finding IMPRESSION: 1. Small layering pleural effusions and mild septal thickening, consider CHF. 2. Age advanced coronary atherosclerosis. 3. Chronic mediastinal adenopathy, stable since at least 2016. Electronically Signed   By: Monte Fantasia M.D.   On: 10/28/2016 12:03       Today   Subjective:   Gabriel [name]  peals better shortness of breath resolved  Objective:   Blood pressure 138/88, pulse 69, temperature 97.8 F (36.6 C), temperature source Oral, resp. rate 15, height 6' (1.829 m), weight 250 lb 5 oz (113.5 kg), SpO2 97 %.  .  Intake/Output Summary (Last 24 hours) at 10/30/16 1335 Last data filed at 10/30/16 1021  Gross per 24 hour  Intake  720 ml  Output             2950 ml  Net             -2230 ml    Exam VITAL SIGNS: Blood pressure 138/88, pulse 69, temperature 97.8 F (36.6 C), temperature source Oral, resp. rate 15, height 6' (1.829 m), weight 250 lb 5 oz (113.5 kg), SpO2 97 %.  GENERAL:  46 y.o.-year-old patient lying in the bed with no acute distress.  EYES: Pupils equal, round, reactive to light and accommodation. No scleral icterus. Extraocular muscles intact.  HEENT: Head atraumatic, normocephalic. Oropharynx and nasopharynx clear.  NECK:  Supple, no jugular venous distention. No thyroid enlargement, no tenderness.  LUNGS: Normal breath sounds bilaterally, no wheezing, rales,rhonchi or crepitation. No use of accessory muscles of respiration.  CARDIOVASCULAR: S1, S2 normal. No murmurs, rubs, or gallops.  ABDOMEN: Soft, nontender, nondistended. Bowel sounds present. No organomegaly or mass.  EXTREMITIES: No pedal edema, cyanosis, or clubbing.  NEUROLOGIC: Cranial nerves II through XII are intact. Muscle strength 5/5 in all extremities. Sensation intact. Gait not checked.  PSYCHIATRIC: The patient is alert and oriented x 3.  SKIN: No obvious rash, lesion, or ulcer.   Data Review     CBC w Diff: Lab Results  Component Value Date   WBC 10.3 10/28/2016   HGB 15.2 10/28/2016   HGB 15.5 10/07/2013   HCT 45.5 10/28/2016   HCT 46.1 10/07/2013   PLT 169 10/28/2016   PLT 227 10/07/2013   LYMPHOPCT 9% 01/06/2016   LYMPHOPCT 29.3 08/26/2012   MONOPCT 7% 01/06/2016   MONOPCT 11.3 08/26/2012   EOSPCT 1% 01/06/2016   EOSPCT 1.0 08/26/2012   BASOPCT 1% 01/06/2016   BASOPCT 0.7 08/26/2012   CMP: Lab Results  Component Value Date   NA 140 10/30/2016   NA 142 01/17/2016   NA 139 10/07/2013   K 3.5 10/30/2016   K 3.8 10/07/2013   CL 104 10/30/2016   CL 107 10/07/2013   CO2 28 10/30/2016   CO2 25 10/07/2013   BUN 31 (H) 10/30/2016   BUN 25 (H) 01/17/2016   BUN 10 10/07/2013   CREATININE 1.15 10/30/2016   CREATININE 0.77 10/07/2013   PROT 7.5 02/15/2016    PROT 8.3 (H) 11/12/2012   ALBUMIN 3.7 02/15/2016   ALBUMIN 4.2 11/12/2012   BILITOT 0.7 02/15/2016   BILITOT 1.7 (H) 11/12/2012   ALKPHOS 83 02/15/2016   ALKPHOS 83 11/12/2012   AST 23 02/15/2016   AST 34 11/12/2012   ALT 21 02/15/2016   ALT 57 11/12/2012  .  Micro Results No results found for this or any previous visit (from the past 240 hour(s)).      Code Status Orders        Start     Ordered   10/28/16 1358  Full code  Continuous     10/28/16 1358    Code Status History    Date Active Date Inactive Code Status Order ID Comments User Context   03/15/2016  7:58 AM 03/16/2016  3:08 PM Full Code [id number]  Harrie Foreman, MD Inpatient   02/16/2016  2:24 AM 02/17/2016  1:02 PM Full Code [id number]  Harrie Foreman, MD Inpatient   01/22/2016 11:51 PM 01/23/2016  4:41 PM Full Code [id number]  Demetrios Loll, MD ED   01/03/2016  2:22 PM 01/09/2016  2:54 PM Full Code [id number]  Lytle Butte, MD ED   05/17/2015 11:41 PM 05/19/2015  5:31 PM Full Code [id number]  Lance Coon, MD Inpatient   03/20/2015  8:26 AM 03/21/2015  3:27 PM Full Code [id number]  Harrie Foreman, MD Inpatient          Follow-up Information    Alisa Graff, FNP. Go on 11/06/2016.   Specialty:  Family Medicine Why:  at 12:20pm to the Heart Failure Clinic Contact information: [place] [address] [address] [phone]        [name], MD Follow up in 2 week(s).   Specialty:  Cardiology Why:  Thursday, March 29th at 1025am, ccs Contact information: [place] [address] [phone]           Discharge Medications   Allergies as of 10/30/2016      Reactions   Prednisone Other (See Comments)   Reaction: Hallucinations      Medication List    STOP taking these medications   levofloxacin 500 MG tablet Commonly known as:  LEVAQUIN     TAKE these medications   amiodarone 200 MG tablet Commonly known as:  PACERONE Take 1 tablet (200  mg total) by mouth 2 (two) times daily.   amLODipine 10 MG tablet Commonly known as:  NORVASC Take 1 tablet (10 mg total) by mouth daily.   aspirin 81 MG chewable tablet Chew 1 tablet (81 mg total) by mouth daily. Reported on 02/08/2016   furosemide 40 MG tablet Commonly known as:  LASIX Take 1 tablet (40 mg total) by mouth daily. What changed:  when to take this   HYDROcodone-acetaminophen 5-325 MG tablet Commonly known as:  NORCO/VICODIN Take 1 tablet by mouth every 6 (six) hours as needed for severe pain.   lisinopril 20 MG tablet Commonly known as:  PRINIVIL,ZESTRIL Take 1 tablet (20 mg total) by mouth daily.   metoprolol succinate 25 MG 24 hr tablet Commonly known as:  TOPROL-XL Take 2 tablets (50 mg total) by mouth daily. Take with or immediately following a meal. What changed:  how much to take   potassium chloride SA 20 MEQ tablet Commonly known as:  K-DUR,KLOR-CON Take 0.5 tablets (10 mEq total) by mouth daily.          Total Time in preparing paper work, data evaluation and todays exam - 35 minutes  Dustin Flock M.D on 10/30/2016 at 1:35 PM  [facility]   Office  [phone]

## 2016-10-30 NOTE — Telephone Encounter (Signed)
Pt has not been discharged yet.

## 2016-10-30 NOTE — Progress Notes (Signed)
[place] at [place] was admitted to the Hospital on 10/28/2016 and Discharged  10/30/2016 and should be excused from work/school   for 4 days starting 10/28/2016 , may return to work/school without any restrictions.  Call Dustin Flock MD with questions.  Dustin Flock M.D on 10/30/2016,at 8:23 AM  [place] at [facility]  [phone]

## 2016-10-30 NOTE — Care Management (Signed)
Patient was discharged and CM was not informed as requested of discharge meds. He has been started on 3 new medications that are on the [facility] four dollar list.  CM has attempted to call patient to discuss.

## 2016-11-06 ENCOUNTER — Telehealth: Payer: Self-pay | Admitting: Family

## 2016-11-06 ENCOUNTER — Ambulatory Visit: Payer: Self-pay | Admitting: Family

## 2016-11-06 NOTE — Telephone Encounter (Signed)
Patient did not show for his Heart Failure Clinic appointment on 11/06/16. Will attempt to reschedule.  

## 2016-11-07 IMAGING — CR DG CHEST 2V
1 series · 2 of 2 positions shown · non-contrast
Comparison: 04/27/2015

CLINICAL DATA: Centralized chest pain since 5 p.m..

EXAM:
CHEST  2 VIEW

[Series 1: w chest pa · 0.14mm/px · 2 of 2 slices shown]
[im 1/2]
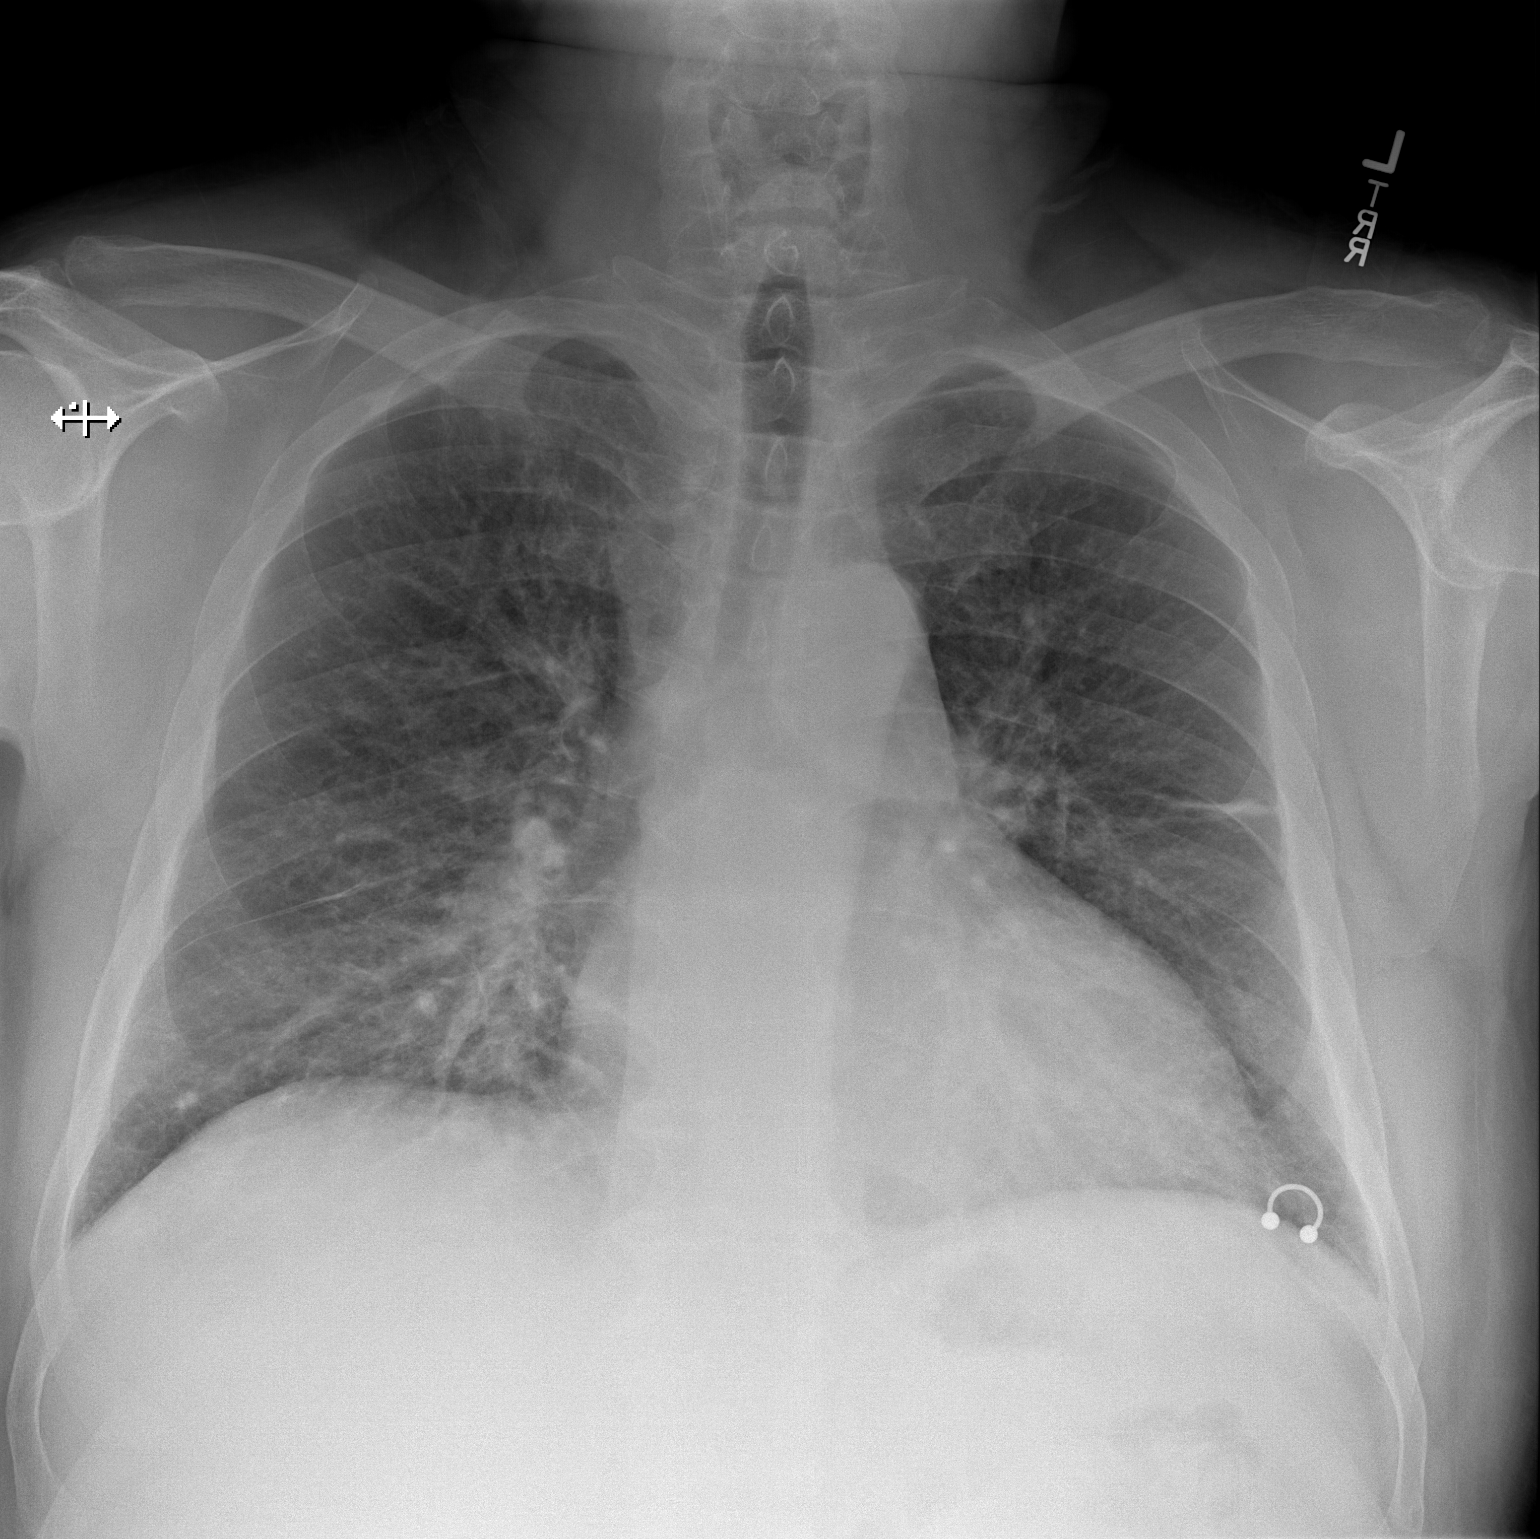
[im 2/2]
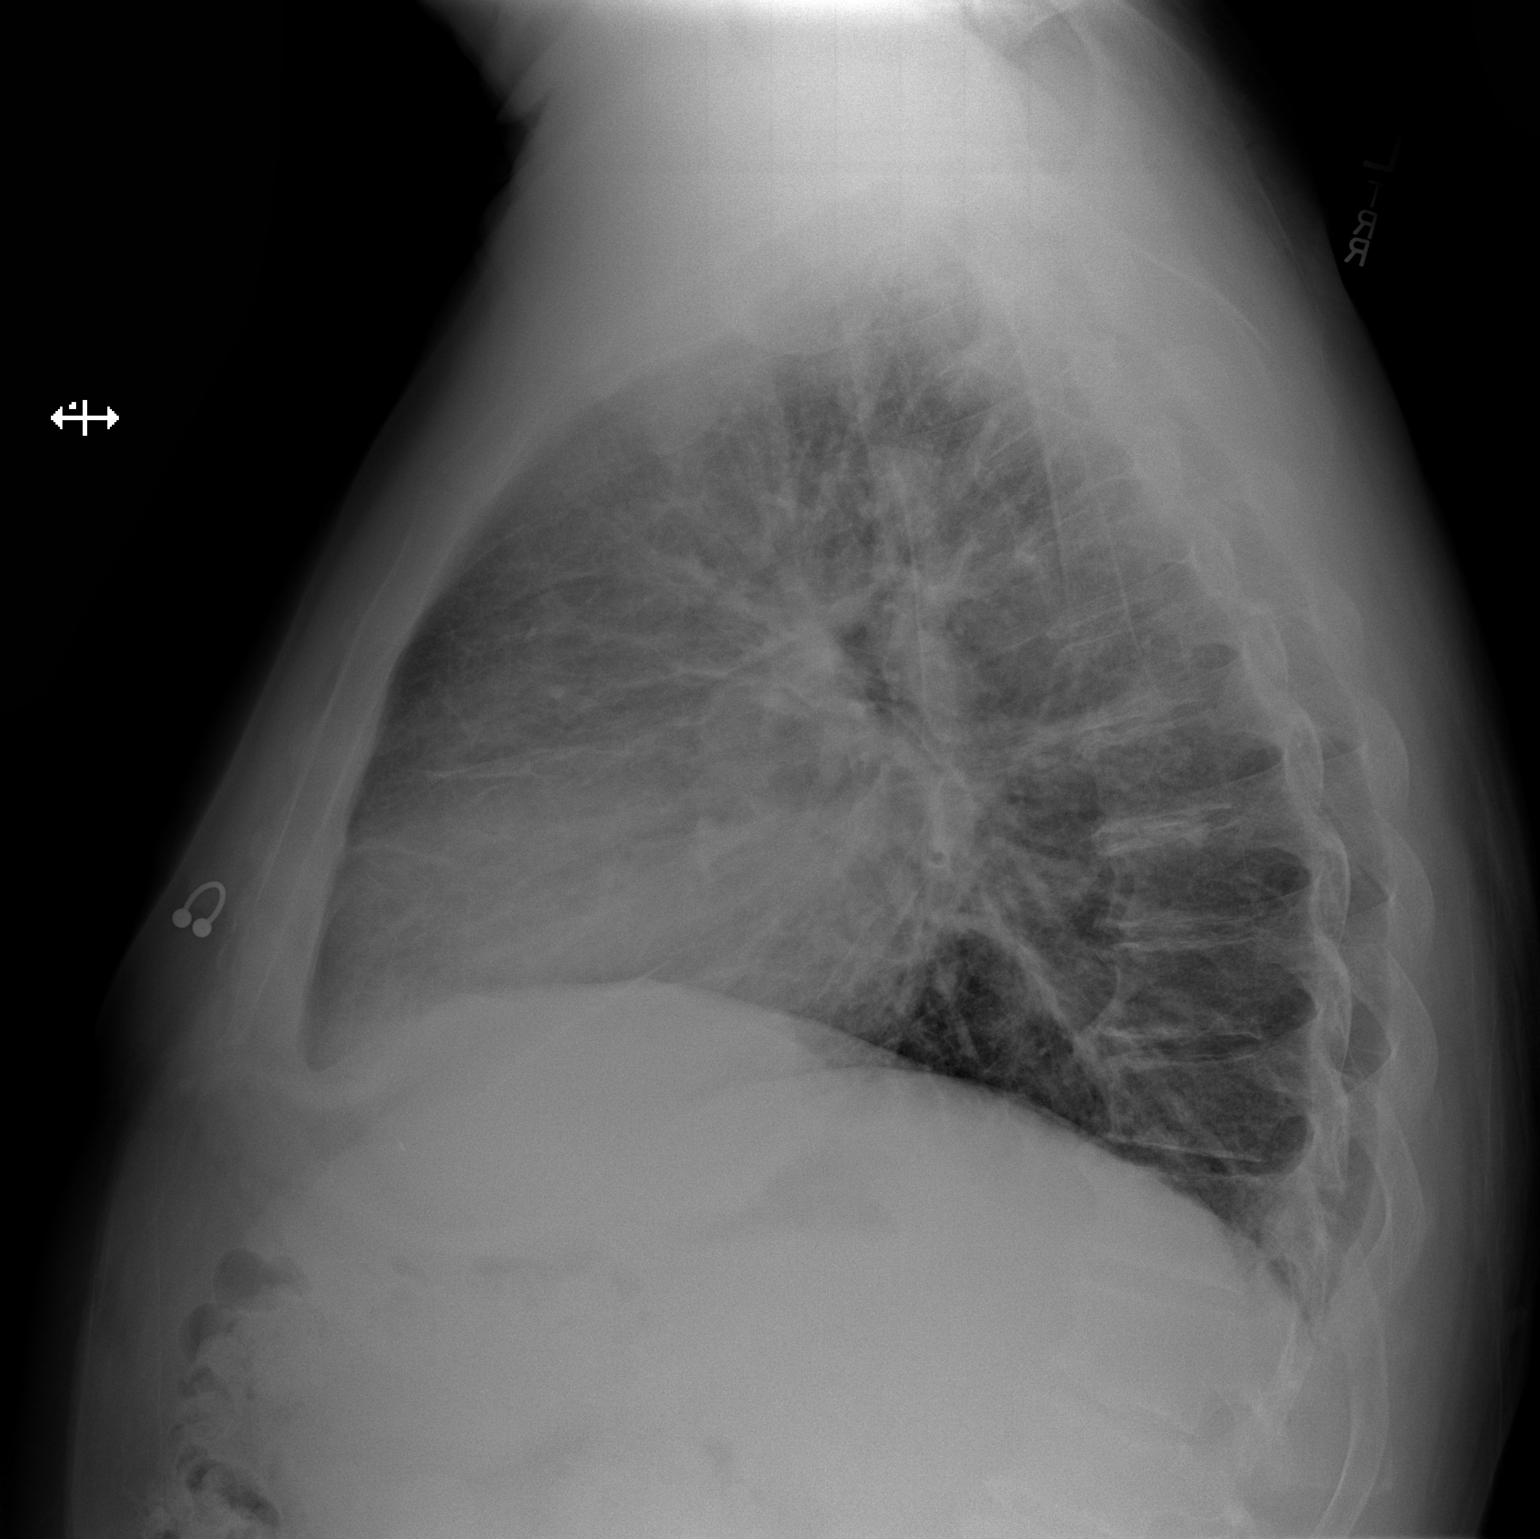

[2 of 2 positions shown; findings below may reference images not displayed]

FINDINGS: The heart is enlarged but stable. The mediastinal and hilar contours
are stable. Mild tortuosity of the thoracic aorta. Central vascular
congestion and chronic interstitial changes with left midlung
scarring. No definite infiltrates, edema or effusions. The bony
thorax is intact.
IMPRESSION: Cardiac enlargement and central vascular congestion without overt
pulmonary edema.

Chronic bronchitic changes and interstitial scarring but no acute
infiltrate.

## 2016-11-12 NOTE — Progress Notes (Deleted)
Cardiology Office Note  Date:  11/12/2016   ID:  Gabriel [name]., DOB 09-16-1970, MRN [id number]  PCP:  No PCP Per Patient   No chief complaint on file.   HPI:  46 y.o. male with h/o  nonischemic cardiomyopathy by nuclear stress test in 2016,  improving ejection fraction up to 30-35%, previously 25% in the setting of sepsis  chronic systolic CHF,  COPD, ongoing tobacco abuse at 2 ppd since age 55,  DM2,  obesity  Obstructive sleep apnea, did not tolerate CPAP Numerous visits to the emergency room for chest pain, shortness of breath  presented to [facility] on 01/[facility]16 with cough and SOB, septic shock, right upper lobe pneumonia, fevers and chills. He presents today for follow-up of his nonischemic cardiomyopathy Prior history of medication noncompliance  Recent hospital admission 10/28/2016 Presented for shortness of breath, lower extremity edema Blood pressure is 163/118 in the emergency room He was kept in the hospital and treated with IV Lasix Discharged on amiodarone, amlodipine 10, Lasix 40, lisinopril 20, metoprolol succinate 50 daily, potassium 20 cut in half,  Seen in the emergency room 09/19/2016 for shortness of breath and chest pain    presented to the hospital end of June with discharge 02/17/2016, admitted for chest pain/pneumonia He was put back on several medications for cardiomyopathy and blood pressure   reports general malaise, unable to do anything, very nonspecific Appears relatively tired in the exam room  Does not feel that he can work, has bills to pay, has not worked in 3 months Does not have money for medications  He did not call us about recent side effects of the medications, was told he had no refills by pharmacy, did not call our office for refills  No further episodes of syncope, reports he has been taking his low-dose amiodarone    EKG on today's visit shows no sinus rhythm with rate 78 bpm, no significant ST or T-wave  changes  Other past medical history reviewed Echocardiogram in the hospital  in the setting of sepsis showed severely depressed ejection fraction 25% CXR showed right upper lobe consolidation c/w pneumonia.  started on IV Rocephin and azithromycin.  Troponin trend of 0.22-->0.19-->0.18-->1.05-->0.13 WBC count of 28,000 on arrival Febrile to a T max of 101.2 this admission.  Hypotensive with BP down to the 70's shortly after admission,   Prior to discharge was started on hydralazine, isosorbide, carvedilol. Secondary to acute renal failure, was not started on ACE inhibitor or ARB He did have short runs of nonsustained VT seen on telemetry On his last clinic visit, he was not on carvedilol  On his last clinic visit we started carvedilol and amiodarone for nonsustained VT on Holter He did not tolerate these medications, had a headache  Early June 2017, had episode of syncope in his kitchen Denied any lightheadedness or dizziness, no warning, When out for a minute witnessed by his girlfriend, woke up on the floor Went to the hospital  He is trying to get Medicaid, disability Was unable to afford a 30 day monitor as he had no insurance  He wore a 48-hour Holter monitor which was reviewed with him in detail today Monitor shows short rare runs of atrial tachycardia, also short rare episodes of nonsustained VT. He does report rare fluttering in his chest like a vibration otherwise no significant symptoms  Patient was previously admitted in August 2016 and September 2016 with atypical chest pain.  Echo during August admission showed mildly reduced LV  systolic function with mildly elevated troponin and BNP. He was hypertensive and unable to afford his medications at that time.   He was discharged back in August 2016 on antihypertensives, but did nto take them. During his September 2016 admission for atypical chest pain and HTN he underwent nuclear stress testing that showed a small  defect of mild severity in the apex location felt to be 2/2 apical thinning. EF was estimated at 30-44%. Overall, this was an intermediate risk scan. There was no evidence of significant perfusion abnormalities and he was felt to have NICM.   PMH:   has a past medical history of Asthma; Cardiomyopathy ([place]); CHF (congestive heart failure) ([place]); COPD (chronic obstructive pulmonary disease) ([place]); Diabetes mellitus without complication ([place]); Hypertension; Obesity (BMI 30.0-34.9); Psoriasis; and Systolic CHF ([place]).  PSH:    Past Surgical History:  Procedure Laterality Date  . AMPUTATION    . FINGER AMPUTATION     Traumatic    Current Outpatient Prescriptions  Medication Sig Dispense Refill  . amiodarone (PACERONE) 200 MG tablet Take 1 tablet (200 mg total) by mouth 2 (two) times daily. 70 tablet 3  . amLODipine (NORVASC) 10 MG tablet Take 1 tablet (10 mg total) by mouth daily. 30 tablet 6  . aspirin 81 MG chewable tablet Chew 1 tablet (81 mg total) by mouth daily. Reported on 02/08/2016 30 tablet 2  . furosemide (LASIX) 40 MG tablet Take 1 tablet (40 mg total) by mouth daily. 90 tablet 2  . HYDROcodone-acetaminophen (NORCO/VICODIN) 5-325 MG tablet Take 1 tablet by mouth every 6 (six) hours as needed for severe pain. 20 tablet 0  . lisinopril (PRINIVIL,ZESTRIL) 20 MG tablet Take 1 tablet (20 mg total) by mouth daily. 30 tablet 6  . metoprolol succinate (TOPROL-XL) 25 MG 24 hr tablet Take 2 tablets (50 mg total) by mouth daily. Take with or immediately following a meal. 30 tablet 6  . potassium chloride SA (K-DUR,KLOR-CON) 20 MEQ tablet Take 0.5 tablets (10 mEq total) by mouth daily. 30 tablet 0   No current facility-administered medications for this visit.      Allergies:   Prednisone   Social History:  The patient  reports that he has been smoking Cigarettes.  He has been smoking about 0.00 packs per day for the past 33.00 years. He has never used smokeless tobacco. He reports that he  drinks about 1.8 oz of alcohol per week . He reports that he does not use drugs.   Family History:   family history includes Cancer in his maternal grandfather and paternal aunt; Diabetes Mellitus II in his mother; Hypertension in his father.    Review of Systems: ROS   PHYSICAL EXAM: VS:  There were no vitals taken for this visit. , BMI There is no height or weight on file to calculate BMI. GEN: Well nourished, well developed, in no acute distress HEENT: normal Neck: no JVD, carotid bruits, or masses Cardiac: RRR; no murmurs, rubs, or gallops,no edema  Respiratory:  clear to auscultation bilaterally, normal work of breathing GI: soft, nontender, nondistended, + BS MS: no deformity or atrophy Skin: warm and dry, no rash Neuro:  Strength and sensation are intact Psych: euthymic mood, full affect    Recent Labs: 02/15/2016: ALT 21; TSH 4.694 10/28/2016: B Natriuretic Peptide 627.0; Hemoglobin 15.2; Platelets 169 10/29/2016: Magnesium 1.8 10/30/2016: BUN 31; Creatinine, Ser 1.15; Potassium 3.5; Sodium 140    Lipid Panel Lab Results  Component Value Date   CHOL 74 01/04/2016  HDL 14 (L) 01/04/2016   LDLCALC 46 01/04/2016   TRIG 71 01/04/2016      Wt Readings from Last 3 Encounters:  10/30/16 250 lb 5 oz (113.5 kg)  09/19/16 245 lb (111.1 kg)  05/23/16 240 lb (108.9 kg)       ASSESSMENT AND PLAN:  No diagnosis found.   Disposition:   F/U  6 months  No orders of the defined types were placed in this encounter.    Signed, Esmond Plants, M.D., Ph.D. 11/12/2016  [place], [place]

## 2016-11-14 ENCOUNTER — Ambulatory Visit: Payer: Self-pay | Admitting: Cardiovascular Disease

## 2016-11-15 ENCOUNTER — Encounter: Payer: Self-pay | Admitting: Cardiovascular Disease

## 2016-11-28 ENCOUNTER — Emergency Department: Payer: Self-pay

## 2016-11-28 ENCOUNTER — Inpatient Hospital Stay
Admission: EM | Admit: 2016-11-28 | Discharge: 2016-11-29 | DRG: 291 | Discharge status: Against Medical Advice | Payer: Self-pay | Attending: Internal Medicine | Admitting: Internal Medicine

## 2016-11-28 ENCOUNTER — Inpatient Hospital Stay (HOSPITAL_COMMUNITY)
Admit: 2016-11-28 | Discharge: 2016-11-28 | Disposition: A | Discharge status: Home / Self Care | Payer: Self-pay | Attending: Physician Assistant | Admitting: Physician Assistant

## 2016-11-28 DIAGNOSIS — N179 Acute kidney failure, unspecified: Secondary | ICD-10-CM | POA: Diagnosis present

## 2016-11-28 DIAGNOSIS — Z7982 Long term (current) use of aspirin: Secondary | ICD-10-CM

## 2016-11-28 DIAGNOSIS — Z888 Allergy status to other drugs, medicaments and biological substances status: Secondary | ICD-10-CM

## 2016-11-28 DIAGNOSIS — Z5321 Procedure and treatment not carried out due to patient leaving prior to being seen by health care provider: Secondary | ICD-10-CM | POA: Diagnosis not present

## 2016-11-28 DIAGNOSIS — Z9111 Patient's noncompliance with dietary regimen: Secondary | ICD-10-CM

## 2016-11-28 DIAGNOSIS — I2510 Atherosclerotic heart disease of native coronary artery without angina pectoris: Secondary | ICD-10-CM | POA: Diagnosis present

## 2016-11-28 DIAGNOSIS — I77810 Thoracic aortic ectasia: Secondary | ICD-10-CM

## 2016-11-28 DIAGNOSIS — R1900 Intra-abdominal and pelvic swelling, mass and lump, unspecified site: Secondary | ICD-10-CM

## 2016-11-28 DIAGNOSIS — I428 Other cardiomyopathies: Secondary | ICD-10-CM

## 2016-11-28 DIAGNOSIS — Z9114 Patient's other noncompliance with medication regimen: Secondary | ICD-10-CM

## 2016-11-28 DIAGNOSIS — N183 Chronic kidney disease, stage 3 (moderate): Secondary | ICD-10-CM | POA: Diagnosis present

## 2016-11-28 DIAGNOSIS — I472 Ventricular tachycardia: Secondary | ICD-10-CM | POA: Diagnosis present

## 2016-11-28 DIAGNOSIS — R109 Unspecified abdominal pain: Secondary | ICD-10-CM

## 2016-11-28 DIAGNOSIS — Z7289 Other problems related to lifestyle: Secondary | ICD-10-CM

## 2016-11-28 DIAGNOSIS — I5023 Acute on chronic systolic (congestive) heart failure: Secondary | ICD-10-CM | POA: Diagnosis present

## 2016-11-28 DIAGNOSIS — E669 Obesity, unspecified: Secondary | ICD-10-CM | POA: Diagnosis present

## 2016-11-28 DIAGNOSIS — E1165 Type 2 diabetes mellitus with hyperglycemia: Secondary | ICD-10-CM | POA: Diagnosis present

## 2016-11-28 DIAGNOSIS — I130 Hypertensive heart and chronic kidney disease with heart failure and stage 1 through stage 4 chronic kidney disease, or unspecified chronic kidney disease: Principal | ICD-10-CM | POA: Diagnosis present

## 2016-11-28 DIAGNOSIS — J441 Chronic obstructive pulmonary disease with (acute) exacerbation: Secondary | ICD-10-CM | POA: Diagnosis present

## 2016-11-28 DIAGNOSIS — I429 Cardiomyopathy, unspecified: Secondary | ICD-10-CM | POA: Diagnosis present

## 2016-11-28 DIAGNOSIS — R079 Chest pain, unspecified: Secondary | ICD-10-CM

## 2016-11-28 DIAGNOSIS — Z6834 Body mass index (BMI) 34.0-34.9, adult: Secondary | ICD-10-CM

## 2016-11-28 DIAGNOSIS — R1011 Right upper quadrant pain: Secondary | ICD-10-CM | POA: Diagnosis present

## 2016-11-28 DIAGNOSIS — Z79899 Other long term (current) drug therapy: Secondary | ICD-10-CM

## 2016-11-28 DIAGNOSIS — F17210 Nicotine dependence, cigarettes, uncomplicated: Secondary | ICD-10-CM | POA: Diagnosis present

## 2016-11-28 DIAGNOSIS — R0600 Dyspnea, unspecified: Secondary | ICD-10-CM

## 2016-11-28 DIAGNOSIS — I5043 Acute on chronic combined systolic (congestive) and diastolic (congestive) heart failure: Secondary | ICD-10-CM | POA: Diagnosis present

## 2016-11-28 DIAGNOSIS — Z9119 Patient's noncompliance with other medical treatment and regimen: Secondary | ICD-10-CM

## 2016-11-28 DIAGNOSIS — I248 Other forms of acute ischemic heart disease: Secondary | ICD-10-CM | POA: Diagnosis present

## 2016-11-28 DIAGNOSIS — E1122 Type 2 diabetes mellitus with diabetic chronic kidney disease: Secondary | ICD-10-CM | POA: Diagnosis present

## 2016-11-28 HISTORY — DX: Chronic kidney disease, stage 3 unspecified: N18.30

## 2016-11-28 HISTORY — DX: Chronic kidney disease, stage 3 (moderate): N18.3

## 2016-11-28 HISTORY — DX: Thoracic aortic ectasia: I77.810

## 2016-11-28 LAB — ECHOCARDIOGRAM COMPLETE
Height: 72 in
Weight: 4052.8 oz

## 2016-11-28 LAB — CBC WITH DIFFERENTIAL/PLATELET
Basophils Absolute: 0.1 10*3/uL (ref 0–0.1)
Basophils Relative: 1 %
Eosinophils Absolute: 0.2 10*3/uL (ref 0–0.7)
Eosinophils Relative: 2 %
HCT: 44.1 % (ref 40.0–52.0)
Hemoglobin: 14.6 g/dL (ref 13.0–18.0)
Lymphocytes Relative: 20 %
Lymphs Abs: 1.8 10*3/uL (ref 1.0–3.6)
MCH: 26.1 pg (ref 26.0–34.0)
MCHC: 33 g/dL (ref 32.0–36.0)
MCV: 79 fL — ABNORMAL LOW (ref 80.0–100.0)
Monocytes Absolute: 0.5 10*3/uL (ref 0.2–1.0)
Monocytes Relative: 6 %
Neutro Abs: 6.5 10*3/uL (ref 1.4–6.5)
Neutrophils Relative %: 71 %
Platelets: 140 10*3/uL — ABNORMAL LOW (ref 150–440)
RBC: 5.58 MIL/uL (ref 4.40–5.90)
RDW: 17 % — ABNORMAL HIGH (ref 11.5–14.5)
WBC: 9.1 10*3/uL (ref 3.8–10.6)

## 2016-11-28 LAB — TROPONIN I
Troponin I: 0.07 ng/mL (ref <0.03)
Troponin I: 0.08 ng/mL (ref <0.03)
Troponin I: 0.07 ng/mL (ref <0.03)
Troponin I: 0.07 ng/mL (ref <0.03)

## 2016-11-28 LAB — BASIC METABOLIC PANEL
Anion gap: 8 (ref 5–15)
BUN: 20 mg/dL (ref 6–20)
CO2: 24 mmol/L (ref 22–32)
Calcium: 8.8 mg/dL — ABNORMAL LOW (ref 8.9–10.3)
Chloride: 105 mmol/L (ref 101–111)
Creatinine, Ser: 0.91 mg/dL (ref 0.61–1.24)
GFR calc Af Amer: >60 mL/min (ref >60)
GFR calc non Af Amer: >60 mL/min (ref >60)
Glucose, Bld: 129 mg/dL — ABNORMAL HIGH (ref 65–99)
Potassium: 3.6 mmol/L (ref 3.5–5.1)
Sodium: 137 mmol/L (ref 135–145)

## 2016-11-28 LAB — MAGNESIUM: Magnesium: 1.6 mg/dL — ABNORMAL LOW (ref 1.7–2.4)

## 2016-11-28 LAB — GLUCOSE, CAPILLARY
Glucose-Capillary: 154 mg/dL — ABNORMAL HIGH (ref 65–99)
Glucose-Capillary: 126 mg/dL — ABNORMAL HIGH (ref 65–99)

## 2016-11-28 LAB — BRAIN NATRIURETIC PEPTIDE: B Natriuretic Peptide: 564 pg/mL — ABNORMAL HIGH (ref 0.0–100.0)

## 2016-11-28 MED ORDER — SPIRONOLACTONE 25 MG PO TABS
12.5000 mg | ORAL_TABLET | Freq: Every day | ORAL | Status: DC
  Administered 2016-11-28 – 2016-11-29 (×2): 12.5 mg via ORAL
  Filled 2016-11-28 (×2): qty 1

## 2016-11-28 MED ORDER — ACETAMINOPHEN 650 MG RE SUPP: 650.0000 mg | Freq: Four times a day (QID) | RECTAL | Status: DC | PRN

## 2016-11-28 MED ORDER — AMLODIPINE BESYLATE 5 MG PO TABS
10.0000 mg | ORAL_TABLET | Freq: Once | ORAL | Status: AC
  Administered 2016-11-28: 10 mg via ORAL
  Filled 2016-11-28: qty 2

## 2016-11-28 MED ORDER — AMIODARONE HCL 200 MG PO TABS
200.0000 mg | ORAL_TABLET | Freq: Every day | ORAL | Status: DC
  Administered 2016-11-28: 200 mg via ORAL
  Filled 2016-11-28 (×2): qty 1

## 2016-11-28 MED ORDER — LISINOPRIL 10 MG PO TABS
20.0000 mg | ORAL_TABLET | Freq: Once | ORAL | Status: AC
  Administered 2016-11-28: 20 mg via ORAL
  Filled 2016-11-28: qty 2

## 2016-11-28 MED ORDER — LORAZEPAM 2 MG/ML IJ SOLN
1.0000 mg | Freq: Once | INTRAMUSCULAR | Status: AC
  Administered 2016-11-28: 1 mg via INTRAVENOUS

## 2016-11-28 MED ORDER — FUROSEMIDE 10 MG/ML IJ SOLN
40.0000 mg | Freq: Two times a day (BID) | INTRAMUSCULAR | Status: DC
  Administered 2016-11-28: 40 mg via INTRAVENOUS
  Filled 2016-11-28: qty 4

## 2016-11-28 MED ORDER — POTASSIUM CHLORIDE CRYS ER 10 MEQ PO TBCR
10.0000 meq | EXTENDED_RELEASE_TABLET | Freq: Every day | ORAL | Status: DC
  Administered 2016-11-29: 10 meq via ORAL
  Filled 2016-11-28 (×2): qty 1

## 2016-11-28 MED ORDER — INSULIN ASPART 100 UNIT/ML ~~LOC~~ SOLN
0.0000 [IU] | Freq: Three times a day (TID) | SUBCUTANEOUS | Status: DC
  Administered 2016-11-28: 2 [IU] via SUBCUTANEOUS
  Administered 2016-11-29: 1 [IU] via SUBCUTANEOUS
  Administered 2016-11-29: 2 [IU] via SUBCUTANEOUS
  Filled 2016-11-28: qty 2
  Filled 2016-11-28 (×2): qty 1

## 2016-11-28 MED ORDER — ONDANSETRON HCL 4 MG PO TABS
4.0000 mg | ORAL_TABLET | Freq: Four times a day (QID) | ORAL | Status: DC | PRN
Start: 2016-11-28 — End: 2016-11-29

## 2016-11-28 MED ORDER — INSULIN ASPART 100 UNIT/ML ~~LOC~~ SOLN: 0.0000 [IU] | Freq: Every day | SUBCUTANEOUS | Status: DC

## 2016-11-28 MED ORDER — ASPIRIN 81 MG PO CHEW
324.0000 mg | CHEWABLE_TABLET | Freq: Once | ORAL | Status: AC
  Administered 2016-11-28: 324 mg via ORAL
  Filled 2016-11-28: qty 4

## 2016-11-28 MED ORDER — NITROGLYCERIN 0.4 MG SL SUBL
0.4000 mg | SUBLINGUAL_TABLET | SUBLINGUAL | Status: DC | PRN
  Administered 2016-11-28: 0.4 mg via SUBLINGUAL

## 2016-11-28 MED ORDER — AMIODARONE HCL 200 MG PO TABS: 200.0000 mg | ORAL_TABLET | Freq: Two times a day (BID) | ORAL | Status: DC

## 2016-11-28 MED ORDER — LORAZEPAM 2 MG/ML IJ SOLN
INTRAMUSCULAR | Status: AC
  Filled 2016-11-28: qty 1

## 2016-11-28 MED ORDER — AMIODARONE HCL 200 MG PO TABS
200.0000 mg | ORAL_TABLET | Freq: Every day | ORAL | Status: DC
  Administered 2016-11-29: 200 mg via ORAL
  Filled 2016-11-28: qty 1

## 2016-11-28 MED ORDER — METOPROLOL TARTRATE 25 MG PO TABS
25.0000 mg | ORAL_TABLET | Freq: Once | ORAL | Status: AC
  Administered 2016-11-28: 25 mg via ORAL
  Filled 2016-11-28: qty 1

## 2016-11-28 MED ORDER — ONDANSETRON HCL 4 MG/2ML IJ SOLN: 4.0000 mg | Freq: Four times a day (QID) | INTRAMUSCULAR | Status: DC | PRN

## 2016-11-28 MED ORDER — LISINOPRIL 20 MG PO TABS
20.0000 mg | ORAL_TABLET | Freq: Every day | ORAL | Status: DC
  Administered 2016-11-29: 20 mg via ORAL
  Filled 2016-11-28 (×2): qty 1

## 2016-11-28 MED ORDER — KETOROLAC TROMETHAMINE 30 MG/ML IJ SOLN
30.0000 mg | Freq: Four times a day (QID) | INTRAMUSCULAR | Status: DC | PRN
  Administered 2016-11-28: 30 mg via INTRAVENOUS
  Filled 2016-11-28: qty 1

## 2016-11-28 MED ORDER — FUROSEMIDE 10 MG/ML IJ SOLN
80.0000 mg | Freq: Once | INTRAMUSCULAR | Status: AC
  Administered 2016-11-28: 80 mg via INTRAVENOUS
  Filled 2016-11-28: qty 8

## 2016-11-28 MED ORDER — HYDROCODONE-ACETAMINOPHEN 5-325 MG PO TABS
1.0000 | ORAL_TABLET | ORAL | Status: DC | PRN
  Administered 2016-11-28 – 2016-11-29 (×5): 2 via ORAL
  Filled 2016-11-28 (×5): qty 2

## 2016-11-28 MED ORDER — ENOXAPARIN SODIUM 40 MG/0.4ML ~~LOC~~ SOLN
40.0000 mg | SUBCUTANEOUS | Status: DC
  Administered 2016-11-28: 40 mg via SUBCUTANEOUS
  Filled 2016-11-28: qty 0.4

## 2016-11-28 MED ORDER — METOPROLOL SUCCINATE ER 50 MG PO TB24
75.0000 mg | ORAL_TABLET | Freq: Every day | ORAL | Status: DC
  Administered 2016-11-29: 75 mg via ORAL
  Filled 2016-11-28: qty 1

## 2016-11-28 MED ORDER — ASPIRIN 81 MG PO CHEW
81.0000 mg | CHEWABLE_TABLET | Freq: Every day | ORAL | Status: DC
  Administered 2016-11-29: 81 mg via ORAL
  Filled 2016-11-28 (×2): qty 1

## 2016-11-28 MED ORDER — ACETAMINOPHEN 325 MG PO TABS
650.0000 mg | ORAL_TABLET | Freq: Four times a day (QID) | ORAL | Status: DC | PRN
Start: 2016-11-28 — End: 2016-11-29

## 2016-11-28 MED ORDER — LORAZEPAM 1 MG PO TABS: 1.0000 mg | ORAL_TABLET | Freq: Once | ORAL | Status: DC

## 2016-11-28 MED ORDER — METOPROLOL SUCCINATE ER 50 MG PO TB24
50.0000 mg | ORAL_TABLET | Freq: Every day | ORAL | Status: DC
  Filled 2016-11-28: qty 1

## 2016-11-28 MED ORDER — NICOTINE 14 MG/24HR TD PT24
14.0000 mg | MEDICATED_PATCH | Freq: Every day | TRANSDERMAL | Status: DC
  Filled 2016-11-28: qty 1

## 2016-11-28 MED ORDER — POTASSIUM CHLORIDE CRYS ER 20 MEQ PO TBCR
20.0000 meq | EXTENDED_RELEASE_TABLET | Freq: Once | ORAL | Status: AC
  Administered 2016-11-28: 20 meq via ORAL
  Filled 2016-11-28: qty 1

## 2016-11-28 MED ORDER — NITROGLYCERIN 0.4 MG SL SUBL
SUBLINGUAL_TABLET | SUBLINGUAL | Status: AC
  Administered 2016-11-28: 0.4 mg via SUBLINGUAL
  Filled 2016-11-28: qty 3

## 2016-11-28 MED ORDER — SODIUM CHLORIDE 0.9% FLUSH: 3.0000 mL | INTRAVENOUS | Status: DC | PRN

## 2016-11-28 MED ORDER — AMLODIPINE BESYLATE 10 MG PO TABS
10.0000 mg | ORAL_TABLET | Freq: Every day | ORAL | Status: DC
  Filled 2016-11-28: qty 1

## 2016-11-28 MED ORDER — SODIUM CHLORIDE 0.9% FLUSH
3.0000 mL | Freq: Two times a day (BID) | INTRAVENOUS | Status: DC
  Administered 2016-11-28 – 2016-11-29 (×2): 3 mL via INTRAVENOUS

## 2016-11-28 MED ORDER — FUROSEMIDE 40 MG PO TABS
40.0000 mg | ORAL_TABLET | Freq: Once | ORAL | Status: AC
  Administered 2016-11-28: 40 mg via ORAL
  Filled 2016-11-28: qty 1

## 2016-11-28 MED ORDER — SODIUM CHLORIDE 0.9 % IV SOLN: 250.0000 mL | INTRAVENOUS | Status: DC | PRN

## 2016-11-28 MED ORDER — SENNOSIDES-DOCUSATE SODIUM 8.6-50 MG PO TABS: 1.0000 | ORAL_TABLET | Freq: Every evening | ORAL | Status: DC | PRN

## 2016-11-28 MED ORDER — ALBUTEROL SULFATE (2.5 MG/3ML) 0.083% IN NEBU: 2.5000 mg | INHALATION_SOLUTION | RESPIRATORY_TRACT | Status: DC | PRN

## 2016-11-28 NOTE — ED Notes (Signed)
Pt with 11 beat run Vtach.  Pt with chest pain and shortness of breath but no different from status upon arrival.  Pt alert, talking with me during this time.  Strip printed and placed in chart; MD notified.

## 2016-11-28 NOTE — Plan of Care (Signed)
Problem: Activity: Goal: Capacity to carry out activities will improve Outcome: Progressing Pt encouraged to take frequent rest break between activities to avoid over excertion

## 2016-11-28 NOTE — ED Notes (Signed)
Patient transported to X-ray 

## 2016-11-28 NOTE — ED Notes (Signed)
Red Vest: Chest Measurement - 8 Reading - 48%

## 2016-11-28 NOTE — ED Notes (Signed)
Pt called out, complaining of RUQ abdominal cramp after heart failure vest.  Pt tachypneic, seems anxious, asking me to "please hurry" when I told him I would go speak with the doctor.  MD notified.

## 2016-11-28 NOTE — ED Provider Notes (Signed)
[facility] Emergency Department Provider Note       Time seen: ----------------------------------------- 8:10 AM on 11/28/2016 -----------------------------------------     I have reviewed the triage vital signs and the nursing notes.   HISTORY   Chief Complaint Chest Pain    HPI Gabriel [name]. is a 46 y.o. male who presents to the ED for chest pain shortness breath started this morning. Patient states pain was at a 10, nothing makes it better or worse. He describes the pain as stabbing or sharp. Patient states someone arrived to take him to work early and he did not did take his morning medications. He has a long history of CHF, cardiomyopathy and COPD. He denies any recent illness but has had a recent cough.   Past Medical History:  Diagnosis Date  . Asthma   . Cardiomyopathy ([place])   . CHF (congestive heart failure) ([place])   . COPD (chronic obstructive pulmonary disease) ([place])   . Diabetes mellitus without complication ([place])    Pre diabetic  . Hypertension    Resolved since weight loss  . Obesity (BMI 30.0-34.9)   . Psoriasis   . Systolic CHF [facility])     Patient Active Problem List   Diagnosis Date Noted  . CHF (congestive heart failure) ([place]) 10/28/2016  . HCAP (healthcare-associated pneumonia) 03/15/2016  . Coronary artery disease, non-occlusive 03/15/2016  . Acute respiratory distress 03/15/2016  . Chest pain 02/16/2016  . Orthostatic hypotension 01/23/2016  . Diarrhea 01/23/2016  . Hypokalemia 01/23/2016  . Hypomagnesemia 01/23/2016  . Chronic systolic CHF (congestive heart failure) ([place]) 01/23/2016  . Syncope 01/22/2016  . 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 NSVT (nonsustained ventricular tachycardia) ([place])   . 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 CAP (community acquired pneumonia)   . Arterial hypotension   . Elevated troponin   . Chronic systolic heart failure ([place])   . Sepsis ([place]) 01/03/2016  .  Acute on chronic combined systolic and diastolic CHF (congestive heart failure) ([place]) 05/17/2015  . Type II diabetes mellitus ([place]) 05/17/2015  . Hyperlipidemia 05/17/2015  . COPD (chronic obstructive pulmonary disease) ([place]) 05/17/2015  . HTN (hypertension) 03/31/2015  . Smoking 03/31/2015    Past Surgical History:  Procedure Laterality Date  . AMPUTATION    . FINGER AMPUTATION     Traumatic    Allergies Prednisone  Social History Social History  Substance Use Topics  . Smoking status: Current Some Day Smoker    Packs/day: 0.00    Years: 33.00    Types: Cigarettes  . Smokeless tobacco: Never Used  . Alcohol use 1.8 oz/week    3 Cans of beer per week     Comment: occassionally    Review of Systems Constitutional: Negative for fever. Cardiovascular: Positive for chest pain Respiratory: Positive for shortness of breath Gastrointestinal: Negative for abdominal pain, vomiting and diarrhea. Genitourinary: Negative for dysuria. Musculoskeletal: Negative for back pain. Skin: Negative for rash. Neurological: Negative for headaches, focal weakness or numbness.  10-point ROS otherwise negative.  ____________________________________________   PHYSICAL EXAM:  VITAL SIGNS: ED Triage Vitals  Enc Vitals Group     BP 11/28/16 0728 (!) 154/105     Pulse Rate 11/28/16 0728 78     Resp 11/28/16 0728 19     Temp 11/28/16 0728 97.6 F (36.4 C)     Temp Source 11/28/16 0728 Oral     SpO2 11/28/16 0728 95 %     Weight 11/28/16 0724  250 lb (113.4 kg)     Height 11/28/16 0724 6' (1.829 m)     Head Circumference --      Peak Flow --      Pain Score 11/28/16 0724 9     Pain Loc --      Pain Edu? --      Excl. in [place]? --     Constitutional: Alert and oriented. Well appearing and in no distress. Eyes: Conjunctivae are normal. PERRL. Normal extraocular movements. ENT   Head: Normocephalic and atraumatic.   Nose: No congestion/rhinnorhea.   Mouth/Throat: Mucous  membranes are moist.   Neck: No stridor. Cardiovascular: Normal rate, regular rhythm. No murmurs, rubs, or gallops. Respiratory: Normal respiratory effort without tachypnea nor retractions. Breath sounds are clear and equal bilaterally. No wheezes/rales/rhonchi. Gastrointestinal: Soft and nontender. Normal bowel sounds Musculoskeletal: Nontender with normal range of motion in extremities. No lower extremity tenderness nor edema. Neurologic:  Normal speech and language. No gross focal neurologic deficits are appreciated.  Skin:  Skin is warm, dry and intact. No rash noted. Psychiatric: Mood and affect are normal. Speech and behavior are normal.  ____________________________________________  EKG: Interpreted by me. Sinus rhythm with a rate of 70 bpm, normal. Interval, normal QRS, normal QT, normal axis.  ____________________________________________  ED COURSE:  Pertinent labs & imaging results that were available during my care of the patient were reviewed by me and considered in my medical decision making (see chart for details). Patient presents for chest pain and shortness of breath, we will assess with labs and imaging as indicated. Clinical Course as of Nov 28 1036  Thu Nov 28, 2016  0831 Patient did have an 11 beat run of V. tach  [JW]  [phone] Patient's labs are at baseline for him. He has been restarted on all of his medications and given additional IV Lasix.  [JW]    Clinical Course User Index [JW] Earleen Newport, MD   Procedures ____________________________________________   LABS (pertinent positives/negatives)  Labs Reviewed  CBC WITH DIFFERENTIAL/PLATELET - Abnormal; Notable for the following:       Result Value   MCV 79.0 (*)    RDW 17.0 (*)    Platelets 140 (*)    All other components within normal limits  BASIC METABOLIC PANEL - Abnormal; Notable for the following:    Glucose, Bld 129 (*)    Calcium 8.8 (*)    All other components within normal limits   BRAIN NATRIURETIC PEPTIDE - Abnormal; Notable for the following:    B Natriuretic Peptide 564.0 (*)    All other components within normal limits  TROPONIN I - Abnormal; Notable for the following:    Troponin I 0.07 (*)    All other components within normal limits    RADIOLOGY Images were viewed by me  Chest x-ray  IMPRESSION: CHF. ____________________________________________  FINAL ASSESSMENT AND PLAN  CHF exacerbation  Plan: Patient's labs and imaging were dictated above. Patient had presented for chest pain and difficulty breathing. Patient is also noncompliant with his medications. We restart his medicines as well as gave him additional IV Lasix without significant improvement in his symptoms. I will discuss with the hospitalist for admission.   Earleen Newport, MD   Note: This note was generated in part or whole with voice recognition software. Voice recognition is usually quite accurate but there are transcription errors that can and very often do occur. I apologize for any typographical errors that were not detected and  corrected.     Earleen Newport, MD 11/28/16 1038

## 2016-11-28 NOTE — Consult Note (Signed)
Cardiology Consultation Note  Patient ID: [name], MRN: [id number], DOB/AGE: Oct 19, 1970 47 y.o. Admit date: 11/28/2016   Date of Consult: 11/28/2016 Primary Physician: No PCP Per Patient Primary Cardiologist: [name], MD  Requesting Physician: [name], MD  Chief Complaint: SOB Reason for Consult: Chest pain/acute on chronic systolic CHF  HPI: Gabriel [name]. is a 46 y.o. male who is being seen today for the evaluation of increased SOB and pleuritic chest pain at the request of [name], MD. Patient has a h/o presumed nonischemic cardiomyopathy by nuclear stress test in 6195, chronic systolic CHF, COPD 2/2 ongoing tobacco abuse at 2 ppd since age 24, DM2, medication noncompliance, prior Neisseria meningitis PNA and bacteremia in 12/2015, vasovagal syncope, and obesity who presented to [facility] on 4/12 with 1-2 week history of increased shortness of breath, abdominal distention, and lower extremity swelling.    Patient was previously admitted in August 2016 and September 2016 with atypical chest pain. Echo during August 2016 admission showed mildly reduced LV systolic function with EF of 45-50%, GR1DD, mild MR, mildly dilated left atrium read by outside group. He was noted to have a mildly elevated troponin and BNP. He had been hypertensive and unable to afford his medications at that time. He was discharged back in August 2016 on antihypertensives, but did not take them. During his September 2016 admission for atypical chest pain and HTN he underwent nuclear stress testing that showed a small defect of mild severity in the apex location felt to be 2/2 apical thinning. EF was estimated at 30-44%. Overall, this was an intermediate risk scan. There was no evidence of significant perfusion abnormalities and he was felt to have NICM. Admitted in mid May 2017 with cough and SOB, found to have Neisseria meningitis PNA and bacteremia. Cardiology saw patient for elevated troponin in the setting of  septic shock. Echo showed an EF of 20-25%, diffuse HK, RWMA could not be excluded, mild MR, LA moderately dilated, RV systolic function was normal, PASP was normal. CT chest revealed no coronary calcifications. He had short runs of NSVT on tele. He was started on amiodarone as an outpatient in hospital follow up on 01/17/16. He wore a Holter monitor to evaluate for arrhythmia, which showed only atrial tach. Admitted June 2017 for syncope felt to be vasovagal. No arrhythmia seen on tele. In hospital follow up he had been noncompliant with medications. He reported fatigue, though was able to go swimming with friends and family. His medications were restarted. Echo from 02/29/16 showed an EF of 30-35%, diffuse HK, LV diastolic function was normal, mild AI/MR, PASP 47 mmHg. Admitted to [facility] 02/2016 with nonexertional chest pain in the setting of persistent RUL PNA. BNP 925, troponin 0.07. Symptoms were felt to be 2/2 persistent RUL PNA. He was gently diuresed. He never followed up as an outpatient. He was seen in the ED in 09/2016 for CAP. Labs at that time showed BNP 645, troponin 0.08-->0.09. CXR c/w PNA. He was treated with ABX and discharged from the ED. He was most recetly admitted to [facility] in mid March 2018 for increased SOB and LE swelling. Cardiology was not consulted. He was diuresed with IV Lasix per IM. CXR that admission showed worsening bronchitis vs interstitial edema. CT chest showed small pleural effusions. BNP of 627. Troponin peak of 0.08. Outpatient cardiology follow up was recommended. Patient did not follow up for uncertain reasons.   He presented to [facility] on 4/12 with 1-2 week  history of increased shortness of breath, abdominal distention, and lower 70 swelling. He reports complete compliance with his medications including Lasix 40 mg daily. He does eat a diet high in salt, having Ramen noodles the night prior. There are also noted to be multiple items from [place] in patient's room at this time.  Patient also notes substernal pleuritic chest pain that is worse with deep inspiration. No exertional symptoms. He has stable two-pillow orthopnea. He has noted some early satiety over the past one to 2 days. He reports approximately 10 pounds of weight gain over the past 2 weeks. Patient reported weight 2 days ago at home of 250 pounds. Lastly, he reports severe right upper quadrant pain that has been present for the past 4-5 years and intermittent in nature. Occasionally, pain is worse when eating greasy foods.   Upon his arrival he was noted to have BP 154/105, heart rate 70 bpm, temperature 97.9F, oxygen saturation is 95% on room air, patient reported weight of 250 pounds (patient not weighed in ED). Labs showed continued improvement and BNP at 564, mild troponin anemia consistent with prior readings at 0.07--> 0.08, serum creatinine 0.91, potassium 3.6, white blood cell count 9.1, hemoglobin 14.6, platelet count 140. Chest x-ray showed cardiomegaly with CHF. EKG showed NSR, 78 bpm, possible prior anterior infarct, no acute ST-T changes. In the ED patient was given ASA 324 mg 1, IV Lasix 80 mg 1 followed by IV Lasix 40 mg 1, KCl 20 mEq times one, sublingual nitroglycerin, Lopressor 25 mg 1, lorazepam 1 mg, lisinopril 20 mg 1, Norco, amlodipine 10 mg 1, and amiodarone 200 mg 1. Documented urine output of 2.25 L in the ED. CHF vest was applied indicating an immedence reading of 48%. Telemetry in the ED showed 11 beats of NSVT. Patient was admitted by internal medicine with IV Lasix 40 mg twice a day, amiodarone 200 mg twice a day, Norvasc 10 mg daily, lisinopril 20 mg daily, metoprolol succinate 80 mg daily. In the ED patient had 2.25 L urine output. Cardiology was asked to further evaluate. Patient currently reports continued shortness of breath though improved. Centralized chest pain only noted with deep inspiration. Improved lower extremity swelling and abdominal distention. Continued intermittent  right upper quadrant pain.    Past Medical History:  Diagnosis Date  . Asthma   . Cardiomyopathy ([place])   . CHF (congestive heart failure) ([place])   . COPD (chronic obstructive pulmonary disease) ([place])   . Diabetes mellitus without complication ([place])    Pre diabetic  . Hypertension    Resolved since weight loss  . Obesity (BMI 30.0-34.9)   . Psoriasis   . Systolic CHF ([place])       Most Recent Cardiac Studies: As above    Surgical History:  Past Surgical History:  Procedure Laterality Date  . AMPUTATION    . FINGER AMPUTATION     Traumatic     Home Meds: Prior to Admission medications   Medication Sig Start Date End Date Taking? Authorizing Provider  amiodarone (PACERONE) 200 MG tablet Take 1 tablet (200 mg total) by mouth 2 (two) times daily. 09/19/16  Yes Earleen Newport, MD  amLODipine (NORVASC) 10 MG tablet Take 1 tablet (10 mg total) by mouth daily. 09/19/16  Yes Earleen Newport, MD  aspirin 81 MG chewable tablet Chew 1 tablet (81 mg total) by mouth daily. Reported on 02/08/2016 09/19/16  Yes Earleen Newport, MD  furosemide (LASIX) 40 MG tablet Take 1 tablet (  40 mg total) by mouth daily. 10/30/16 10/30/17 Yes Dustin Flock, MD  HYDROcodone-acetaminophen (NORCO/VICODIN) 5-325 MG tablet Take 1 tablet by mouth every 6 (six) hours as needed for severe pain. 09/19/16  Yes Earleen Newport, MD  lisinopril (PRINIVIL,ZESTRIL) 20 MG tablet Take 1 tablet (20 mg total) by mouth daily. 09/19/16  Yes Earleen Newport, MD  metoprolol succinate (TOPROL-XL) 25 MG 24 hr tablet Take 2 tablets (50 mg total) by mouth daily. Take with or immediately following a meal. 10/30/16  Yes Dustin Flock, MD  potassium chloride SA (K-DUR,KLOR-CON) 20 MEQ tablet Take 0.5 tablets (10 mEq total) by mouth daily. 10/30/16  Yes Dustin Flock, MD    Inpatient Medications:  . amiodarone  200 mg Oral Daily     Allergies:  Allergies  Allergen Reactions  . Prednisone Other (See Comments)    Reaction:  Hallucinations     Social History   Social History  . Marital status: Widowed    Spouse name: N/A  . Number of children: N/A  . Years of education: N/A   Occupational History  . Not on file.   Social History Main Topics  . Smoking status: Current Some Day Smoker    Packs/day: 0.00    Years: 33.00    Types: Cigarettes  . Smokeless tobacco: Never Used  . Alcohol use 1.8 oz/week    3 Cans of beer per week     Comment: occassionally  . Drug use: No  . Sexual activity: Not on file   Other Topics Concern  . Not on file   Social History Narrative  . No narrative on file     Family History  Problem Relation Age of Onset  . Diabetes Mellitus II Mother   . Hypertension Father   . Cancer Paternal Aunt   . Cancer Maternal Grandfather      Review of Systems: Review of Systems  Constitutional: Positive for malaise/fatigue. Negative for chills, diaphoresis, fever and weight loss.  HENT: Negative for congestion.   Eyes: Negative for discharge and redness.  Respiratory: Positive for cough and shortness of breath. Negative for hemoptysis, sputum production and wheezing.   Cardiovascular: Positive for chest pain, orthopnea and leg swelling. Negative for palpitations, claudication and PND.       Stable 2-pillow orthopnea. Chest pain worse with deep inspiration.   Gastrointestinal: Positive for abdominal pain. Negative for blood in stool, heartburn, melena, nausea and vomiting.       RUQ ABD pain  Genitourinary: Negative for hematuria.  Musculoskeletal: Negative for falls and myalgias.  Skin: Negative for rash.  Neurological: Positive for weakness. Negative for dizziness, tingling, tremors, sensory change, speech change, focal weakness and loss of consciousness.  Endo/Heme/Allergies: Does not bruise/bleed easily.  Psychiatric/Behavioral: Negative for substance abuse. The patient is not nervous/anxious.   All other systems reviewed and are negative.   Labs:  Recent Labs   11/28/16 0812 11/28/16 1055  TROPONINI 0.07* 0.08*   Lab Results  Component Value Date   WBC 9.1 11/28/2016   HGB 14.6 11/28/2016   HCT 44.1 11/28/2016   MCV 79.0 (L) 11/28/2016   PLT 140 (L) 11/28/2016    Recent Labs Lab 11/28/16 0812  NA 137  K 3.6  CL 105  CO2 24  BUN 20  CREATININE 0.91  CALCIUM 8.8*  GLUCOSE 129*   Lab Results  Component Value Date   CHOL 74 01/04/2016   HDL 14 (L) 01/04/2016   LDLCALC 46 01/04/2016   TRIG  71 01/04/2016   No results found for: DDIMER  Radiology/Studies:  Dg Chest 2 View  Result Date: 11/28/2016 CLINICAL DATA:  Dyspnea EXAM: CHEST  2 VIEW COMPARISON:  10/28/2016 FINDINGS: Cardiomegaly and vascular pedicle widening. Interstitial coarsening with Kerley lines and fissural thickening. No noted pleural effusions. IMPRESSION: CHF. Electronically Signed   By: Monte Fantasia M.D.   On: 11/28/2016 08:15    EKG: Interpreted by me showed: NSR, 78 bpm, possible prior anterior infarct, no acute ST-T changes Telemetry: Interpreted by me showed: NSR, 70s bpm, no evidence of NSVT seen on telemetry   Weights: Filed Weights   11/28/16 0724  Weight: 250 lb (113.4 kg)     Physical Exam: Blood pressure 138/82, pulse 72, temperature 97.7 F (36.5 C), temperature source Oral, resp. rate 18, height 6' (1.829 m), weight 250 lb (113.4 kg), SpO2 94 %. Body mass index is 33.91 kg/m. General: Well developed, well nourished, in no acute distress. Head: Normocephalic, atraumatic, sclera non-icteric, no xanthomas, nares are without discharge.  Neck: Negative for carotid bruits. JVD unable to be assessed 2/2 severe RUQ palpation. Lungs: Diminished breath sounds bilaterally with bibasilar crackles. Breathing is unlabored. Heart: RRR with S1 S2. No murmurs, rubs, or gallops appreciated. Abdomen: Soft, severe tenderness to palpation of the RUQ, non-distended with normoactive bowel sounds. No hepatomegaly. No rebound/guarding. No obvious abdominal  masses. Msk:  Strength and tone appear normal for age. Extremities: No clubbing or cyanosis. No edema. Distal pedal pulses are 2+ and equal bilaterally. Neuro: Alert and oriented X 3. No facial asymmetry. No focal deficit. Moves all extremities spontaneously. Psych:  Responds to questions appropriately with a normal affect.    Assessment and Plan:  Active Problems:   Acute on chronic systolic CHF (congestive heart failure) ([place])    1. Acute respiratory distress with hypoxia:  -Likely multifactorial including acute on chronic systolic CHF secondary to medication and dietary noncompliance, possible COPD exacerbation, and anxiety  -Wean oxygen as able per primary team   2. Acute on chronic systolic CHF:  -Likely in the setting of medication and dietary noncompliance  -Has diuresed greater than 2 L in the ED this morning with IV Lasix 80 mg 1 followed by IV Lasix 40 mg 1  -Agree with continued IV diuresis with IV Lasix 40 mg twice a day along with KCl repletion  -Check echocardiogram  -Continue Toprol-XL 50 mg daily, lisinopril 20 mg daily  -Recommend discontinuation of amlodipine given patient's cardiomyopathy with the addition of spironolactone as well as titration of beta blocker and ACE inhibitor to address his hypertension  -Strict I's and O's, daily weights  -CHF education  -Consider dietary consultation   3. Possible COPD exacerbation:  -Per internal medicine   4. Elevated troponin/atypical chest pain/nonobstructive CAD:  -Minimally elevated at 0.08 consistent with prior levels noted during multiple admissions and ED visits  -Likely in the setting of supply demand ischemia secondary to volume overload  -Prior stress test in 2016 nonischemic  -May benefit from outpatient nuclear stress testing once breathing is back to baseline, should patient side to follow-up  -ASA  -Beta blocker as above  -Check lipid panel for further risk stratification   3. NSVT:  -Previously noted  during prior hospitalization admission  -Has been on amiodarone since EHM/0947, though uncertain patient compliance  -No evidence of ventricular ectopy on telemetry at this time  -Likely in the setting of the above cardiomyopathy  -Continue Toprol as above  -Will decrease amiodarone to 200 mg  daily, would ideally like to taper off amiodarone given patient's age  -Check magnesium and thyroid function  -Replete potassium to goal 4.0   4. Hypertensive heart disease:  -Improving  -Continue Toprol and diuresis as above  -Titrate antihypertensives throughout admission as needed   5. Hyperglycemia:  -Check hemoglobin A1c  6. Severe right upper quadrant abdominal pain: -Severely tender to palpation -Defer imaging to internal medicine   Signed, Christell Faith, PA-C [place] Pager: [phone] 11/28/2016, 1:29 PM

## 2016-11-28 NOTE — H&P (Addendum)
[facility] [place] NAME: Gabriel [name]    MR#:  [id number]  DATE OF BIRTH:  05-Aug-1971  DATE OF ADMISSION:  11/28/2016  PRIMARY CARE PHYSICIAN: No PCP Per Patient   REQUESTING/REFERRING PHYSICIAN: Earleen Newport, MD  CHIEF COMPLAINT:   Chief Complaint  Patient presents with  . Chest Pain   Chest pain and shortness breath this morning. HISTORY OF PRESENT ILLNESS:  Gabriel [name]  is a 46 y.o. male with a known history of Chronic systolic CHF, cardiomyopathy, COPD, hypertension and diabetes. The patient presented to the ED with chest pain and shortness breath since this morning. The chest pain is in substernal area, tight and sharp, constant 10 out of 10 without radiation. He denies any fever or chills. But has orthopnea and nocturnal dyspnea and leg edema. Chest x-ray show CHF. He was treated with IV Lasix in the ED.  PAST MEDICAL HISTORY:   Past Medical History:  Diagnosis Date  . Asthma   . Cardiomyopathy ([place])   . CHF (congestive heart failure) ([place])   . COPD (chronic obstructive pulmonary disease) ([place])   . Diabetes mellitus without complication ([place])    Pre diabetic  . Hypertension    Resolved since weight loss  . Obesity (BMI 30.0-34.9)   . Psoriasis   . Systolic CHF ([place])     PAST SURGICAL HISTORY:   Past Surgical History:  Procedure Laterality Date  . AMPUTATION    . FINGER AMPUTATION     Traumatic    SOCIAL HISTORY:   Social History  Substance Use Topics  . Smoking status: Current Some Day Smoker    Packs/day: 0.00    Years: 33.00    Types: Cigarettes  . Smokeless tobacco: Never Used  . Alcohol use 1.8 oz/week    3 Cans of beer per week     Comment: occassionally    FAMILY HISTORY:   Family History  Problem Relation Age of Onset  . Diabetes Mellitus II Mother   . Hypertension Father   . Cancer Paternal Aunt   . Cancer Maternal Grandfather     DRUG ALLERGIES:   Allergies  Allergen  Reactions  . Prednisone Other (See Comments)    Reaction: Hallucinations     REVIEW OF SYSTEMS:   Review of Systems  Constitutional: Positive for malaise/fatigue. Negative for chills and fever.  HENT: Negative for congestion.   Eyes: Negative for blurred vision and double vision.  Respiratory: Positive for shortness of breath. Negative for cough, hemoptysis, sputum production, wheezing and stridor.   Cardiovascular: Positive for chest pain and orthopnea. Negative for palpitations and leg swelling.  Gastrointestinal: Negative for abdominal pain, blood in stool, diarrhea, melena, nausea and vomiting.  Genitourinary: Negative for dysuria and hematuria.  Musculoskeletal: Negative for back pain.  Skin: Negative for itching and rash.  Neurological: Negative for dizziness, focal weakness and loss of consciousness.  Psychiatric/Behavioral: Negative for depression. The patient is not nervous/anxious.     MEDICATIONS AT HOME:   Prior to Admission medications   Medication Sig Start Date End Date Taking? Authorizing Provider  amiodarone (PACERONE) 200 MG tablet Take 1 tablet (200 mg total) by mouth 2 (two) times daily. 09/19/16  Yes Earleen Newport, MD  amLODipine (NORVASC) 10 MG tablet Take 1 tablet (10 mg total) by mouth daily. 09/19/16  Yes Earleen Newport, MD  aspirin 81 MG chewable tablet Chew 1 tablet (81 mg total) by mouth daily. Reported on  02/08/2016 09/19/16  Yes Earleen Newport, MD  furosemide (LASIX) 40 MG tablet Take 1 tablet (40 mg total) by mouth daily. 10/30/16 10/30/17 Yes Dustin Flock, MD  HYDROcodone-acetaminophen (NORCO/VICODIN) 5-325 MG tablet Take 1 tablet by mouth every 6 (six) hours as needed for severe pain. 09/19/16  Yes Earleen Newport, MD  lisinopril (PRINIVIL,ZESTRIL) 20 MG tablet Take 1 tablet (20 mg total) by mouth daily. 09/19/16  Yes Earleen Newport, MD  metoprolol succinate (TOPROL-XL) 25 MG 24 hr tablet Take 2 tablets (50 mg total) by mouth daily. Take  with or immediately following a meal. 10/30/16  Yes Dustin Flock, MD  potassium chloride SA (K-DUR,KLOR-CON) 20 MEQ tablet Take 0.5 tablets (10 mEq total) by mouth daily. 10/30/16  Yes Dustin Flock, MD      VITAL SIGNS:  Blood pressure 138/82, pulse 72, temperature 97.7 F (36.5 C), temperature source Oral, resp. rate 18, height 6' (1.829 m), weight 253 lb 4.8 oz (114.9 kg), SpO2 94 %.  PHYSICAL EXAMINATION:  Physical Exam  GENERAL:  46 y.o.-year-old patient lying in the bed with no acute distress. Obese. EYES: Pupils equal, round, reactive to light and accommodation. No scleral icterus. Extraocular muscles intact.  HEENT: Head atraumatic, normocephalic. Oropharynx and nasopharynx clear.  NECK:  Supple, no jugular venous distention. No thyroid enlargement, no tenderness.  LUNGS: Normal breath sounds bilaterally, no wheezing, bilateral basilar rales, no rhonchi or crepitation. No use of accessory muscles of respiration.  CARDIOVASCULAR: S1, S2 normal. No murmurs, rubs, or gallops.  ABDOMEN: Soft, nontender, nondistended. Bowel sounds present. No organomegaly or mass.  EXTREMITIES: Bilateral leg edema 1+, no cyanosis, or clubbing.  NEUROLOGIC: Cranial nerves II through XII are intact. Muscle strength 5/5 in all extremities. Sensation intact. Gait not checked.  PSYCHIATRIC: The patient is alert and oriented x 3.  SKIN: No obvious rash, lesion, or ulcer.   LABORATORY PANEL:   CBC  Recent Labs Lab 11/28/16 0812  WBC 9.1  HGB 14.6  HCT 44.1  PLT 140*   ------------------------------------------------------------------------------------------------------------------  Chemistries   Recent Labs Lab 11/28/16 0812  NA 137  K 3.6  CL 105  CO2 24  GLUCOSE 129*  BUN 20  CREATININE 0.91  CALCIUM 8.8*   ------------------------------------------------------------------------------------------------------------------  Cardiac Enzymes  Recent Labs Lab 11/28/16 1055    TROPONINI 0.08*   ------------------------------------------------------------------------------------------------------------------  RADIOLOGY:  Dg Chest 2 View  Result Date: 11/28/2016 CLINICAL DATA:  Dyspnea EXAM: CHEST  2 VIEW COMPARISON:  10/28/2016 FINDINGS: Cardiomegaly and vascular pedicle widening. Interstitial coarsening with Kerley lines and fissural thickening. No noted pleural effusions. IMPRESSION: CHF. Electronically Signed   By: Monte Fantasia M.D.   On: 11/28/2016 08:15      IMPRESSION AND PLAN:   Acute on chronic Systolic CHF. The patient is being admitted for to medical floor with telemetry monitor. Start Lasix IV 40 mg twice a day, CHF protocol, echo and cardiology consult. Continue Toprol and lisinopril.  Acute respiratory failure with hypoxia. NEB when necessary, O2 [place].  Elevated troponin. Demand ischemia Due to above. Follow-up cardiologist.  COPD. Stable. NEB when necessary.  Hypertension. Continue home hypertension medications Diabetes, start sliding scale. Obesity. Tobacco abuse. Smoking cessation was counseled for 4 minutes, nicotine patch. All the records are reviewed and case discussed with ED provider. Management plans discussed with the patient, family and they are in agreement.  CODE STATUS: Full code  TOTAL TIME TAKING CARE OF THIS PATIENT: 57 minutes.    Demetrios Loll M.D on 11/28/2016 at  2:53 PM  Between 7am to 6pm - Pager - [phone]  After 6pm go to [email or web address] - [employer] [facility]  Office  [phone]  CC: Primary care physician; No PCP Per Patient   Note: This dictation was prepared with Dragon dictation along with smaller phrase technology. Any transcriptional errors that result from this process are unintentional.

## 2016-11-28 NOTE — ED Triage Notes (Signed)
Patient comes in with complaint of chest pain and shortness of breath that started this morning. Patient states pain 9 out of 10.

## 2016-11-29 ENCOUNTER — Inpatient Hospital Stay: Payer: Self-pay

## 2016-11-29 ENCOUNTER — Encounter: Payer: Self-pay | Admitting: Internal Medicine

## 2016-11-29 LAB — HEPATIC FUNCTION PANEL
ALT: 21 U/L (ref 17–63)
AST: 24 U/L (ref 15–41)
Albumin: 3.5 g/dL (ref 3.5–5.0)
Alkaline Phosphatase: 65 U/L (ref 38–126)
Bilirubin, Direct: 0.2 mg/dL (ref 0.1–0.5)
Indirect Bilirubin: 1.2 mg/dL — ABNORMAL HIGH (ref 0.3–0.9)
Total Bilirubin: 1.4 mg/dL — ABNORMAL HIGH (ref 0.3–1.2)
Total Protein: 6.8 g/dL (ref 6.5–8.1)

## 2016-11-29 LAB — CBC
HCT: 45.8 % (ref 40.0–52.0)
Hemoglobin: 14.8 g/dL (ref 13.0–18.0)
MCH: 26.1 pg (ref 26.0–34.0)
MCHC: 32.4 g/dL (ref 32.0–36.0)
MCV: 80.7 fL (ref 80.0–100.0)
Platelets: 146 10*3/uL — ABNORMAL LOW (ref 150–440)
RBC: 5.68 MIL/uL (ref 4.40–5.90)
RDW: 17.3 % — ABNORMAL HIGH (ref 11.5–14.5)
WBC: 8.5 10*3/uL (ref 3.8–10.6)

## 2016-11-29 LAB — BASIC METABOLIC PANEL
Anion gap: 7 (ref 5–15)
BUN: 26 mg/dL — ABNORMAL HIGH (ref 6–20)
CO2: 28 mmol/L (ref 22–32)
Calcium: 8.9 mg/dL (ref 8.9–10.3)
Chloride: 105 mmol/L (ref 101–111)
Creatinine, Ser: 1.41 mg/dL — ABNORMAL HIGH (ref 0.61–1.24)
GFR calc Af Amer: >60 mL/min (ref >60)
GFR calc non Af Amer: 58 mL/min — ABNORMAL LOW (ref >60)
Glucose, Bld: 130 mg/dL — ABNORMAL HIGH (ref 65–99)
Potassium: 3.7 mmol/L (ref 3.5–5.1)
Sodium: 140 mmol/L (ref 135–145)

## 2016-11-29 LAB — GLUCOSE, CAPILLARY
Glucose-Capillary: 145 mg/dL — ABNORMAL HIGH (ref 65–99)
Glucose-Capillary: 112 mg/dL — ABNORMAL HIGH (ref 65–99)
Glucose-Capillary: 160 mg/dL — ABNORMAL HIGH (ref 65–99)

## 2016-11-29 MED ORDER — SPIRONOLACTONE 25 MG PO TABS: 12.5000 mg | ORAL_TABLET | Freq: Once | ORAL | Status: DC

## 2016-11-29 MED ORDER — METHYLPREDNISOLONE SODIUM SUCC 125 MG IJ SOLR
60.0000 mg | Freq: Two times a day (BID) | INTRAMUSCULAR | Status: DC
  Administered 2016-11-29: 60 mg via INTRAVENOUS
  Filled 2016-11-29: qty 2

## 2016-11-29 MED ORDER — MAGNESIUM SULFATE IN D5W 1-5 GM/100ML-% IV SOLN
1.0000 g | Freq: Once | INTRAVENOUS | Status: AC
  Administered 2016-11-29: 1 g via INTRAVENOUS
  Filled 2016-11-29: qty 100

## 2016-11-29 MED ORDER — SPIRONOLACTONE 25 MG PO TABS: 25.0000 mg | ORAL_TABLET | Freq: Every day | ORAL | Status: DC

## 2016-11-29 MED ORDER — IPRATROPIUM-ALBUTEROL 0.5-2.5 (3) MG/3ML IN SOLN: 3.0000 mL | Freq: Four times a day (QID) | RESPIRATORY_TRACT | Status: DC

## 2016-11-29 MED ORDER — IPRATROPIUM-ALBUTEROL 0.5-2.5 (3) MG/3ML IN SOLN
3.0000 mL | Freq: Four times a day (QID) | RESPIRATORY_TRACT | Status: DC
Start: 2016-11-29 — End: 2016-11-29
  Administered 2016-11-29: 3 mL via RESPIRATORY_TRACT

## 2016-11-29 MED ORDER — MAGNESIUM OXIDE 400 (241.3 MG) MG PO TABS
400.0000 mg | ORAL_TABLET | Freq: Every day | ORAL | Status: DC
  Administered 2016-11-29: 400 mg via ORAL
  Filled 2016-11-29: qty 1

## 2016-11-29 MED ORDER — MORPHINE SULFATE (PF) 4 MG/ML IV SOLN: 2.0000 mg | INTRAVENOUS | Status: DC | PRN

## 2016-11-29 NOTE — Progress Notes (Signed)
Ryan PA notified. Pt had 6 beats of VT. Magnesium PO given. No new orders at this time. I will continue to assess.

## 2016-11-29 NOTE — Progress Notes (Signed)
Patient Name: Gabriel [name]. Date of Encounter: 11/29/2016  Primary Cardiologist: [facility] Problem List     Active Problems:   Acute on chronic systolic CHF (congestive heart failure) (HCC)     Subjective   SOB improving, still reports some. UOP 3.5 L for the past 24 hours. Weight up 1 pound, from 253 at admission to 254 today. RUQ pain persists. Echo showed slightly improved LV systolic function from 41% to 45%. Elevated CVP. Renal function worsened with diuresis, Lasix held this morning. Magnesium slightly low at 1.6.   Inpatient Medications    Scheduled Meds: . aspirin  81 mg Oral Daily  . enoxaparin (LOVENOX) injection  40 mg Subcutaneous Q24H  . insulin aspart  0-5 Units Subcutaneous QHS  . insulin aspart  0-9 Units Subcutaneous TID WC  . lisinopril  20 mg Oral Daily  . metoprolol succinate  75 mg Oral Daily  . nicotine  14 mg Transdermal Daily  . potassium chloride SA  10 mEq Oral Daily  . sodium chloride flush  3 mL Intravenous Q12H  . spironolactone  12.5 mg Oral Daily   Continuous Infusions:  PRN Meds: sodium chloride, acetaminophen **OR** acetaminophen, albuterol, HYDROcodone-acetaminophen, ketorolac, nitroGLYCERIN, ondansetron **OR** ondansetron (ZOFRAN) IV, senna-docusate, sodium chloride flush   Vital Signs    Vitals:   11/28/16 2024 11/29/16 0355 11/29/16 0500 11/29/16 0749  BP: (!) 143/88 127/77  (!) 143/89  Pulse: 74 68  70  Resp:  18  18  Temp:  97.5 F (36.4 C)  97.7 F (36.5 C)  TempSrc:  Oral  Oral  SpO2: 95% 98%  95%  Weight:   254 lb 12.8 oz (115.6 kg)   Height:        Intake/Output Summary (Last 24 hours) at 11/29/16 0828 Last data filed at 11/29/16 3244  Gross per 24 hour  Intake              360 ml  Output             3875 ml  Net            -3515 ml   Filed Weights   11/28/16 0724 11/28/16 1309 11/29/16 0500  Weight: 250 lb (113.4 kg) 253 lb 4.8 oz (114.9 kg) 254 lb 12.8 oz (115.6 kg)    Physical Exam    GEN:  Well nourished, well developed, in no acute distress.  HEENT: Grossly normal.  Neck: Supple, JVD unable to be assessed 2/2 RUQ pain, no carotid bruits, or masses. Cardiac: RRR, no murmurs, rubs, or gallops. No clubbing, cyanosis, edema.  Radials/DP/PT 2+ and equal bilaterally.  Respiratory:  Improved breath sounds bilaterally with faint right base crackles. GI: Soft, nontender, nondistended, BS + x 4. MS: no deformity or atrophy. Skin: warm and dry, no rash. Neuro:  Strength and sensation are intact. Psych: AAOx3.  Normal affect.  Labs    CBC  Recent Labs  11/28/16 0812 11/29/16 0627  WBC 9.1 8.5  NEUTROABS 6.5  --   HGB 14.6 14.8  HCT 44.1 45.8  MCV 79.0* 80.7  PLT 140* 010*   Basic Metabolic Panel  Recent Labs  11/28/16 0812 11/28/16 1359 11/29/16 0627  NA 137  --  140  K 3.6  --  3.7  CL 105  --  105  CO2 24  --  28  GLUCOSE 129*  --  130*  BUN 20  --  26*  CREATININE 0.91  --  1.41*  CALCIUM 8.8*  --  8.9  MG  --  1.6*  --    Liver Function Tests No results for input(s): AST, ALT, ALKPHOS, BILITOT, PROT, ALBUMIN in the last 72 hours. No results for input(s): LIPASE, AMYLASE in the last 72 hours. Cardiac Enzymes  Recent Labs  11/28/16 1055 11/28/16 1359 11/28/16 2003  TROPONINI 0.08* 0.07* 0.07*   BNP Invalid input(s): POCBNP D-Dimer No results for input(s): DDIMER in the last 72 hours. Hemoglobin A1C No results for input(s): HGBA1C in the last 72 hours. Fasting Lipid Panel No results for input(s): CHOL, HDL, LDLCALC, TRIG, CHOLHDL, LDLDIRECT in the last 72 hours. Thyroid Function Tests No results for input(s): TSH, T4TOTAL, T3FREE, THYROIDAB in the last 72 hours.  Invalid input(s): FREET3  Telemetry    Sinus rhythm, 70s currently, occasional NSVT 4 beats - Personally Reviewed  ECG    n/a - Personally Reviewed  Radiology    Dg Chest 2 View  Result Date: 11/28/2016 CLINICAL DATA:  Dyspnea EXAM: CHEST  2 VIEW COMPARISON:  10/28/2016  FINDINGS: Cardiomegaly and vascular pedicle widening. Interstitial coarsening with Kerley lines and fissural thickening. No noted pleural effusions. IMPRESSION: CHF. Electronically Signed   By: Monte Fantasia M.D.   On: 11/28/2016 08:15    Cardiac Studies   TTE 11/28/2016: Study Conclusions  - Left ventricle: The cavity size was mildly dilated. There was   mild concentric hypertrophy. Systolic function was mildly to   moderately reduced. The estimated ejection fraction was in the   range of 40% to 45%. Diffuse hypokinesis. Left ventricular   diastolic function parameters were normal. - Aortic root: The aortic root was mildly dilated. - Mitral valve: There was mild regurgitation. - Left atrium: The atrium was moderately dilated. - Pulmonary arteries: Systolic pressure could not be accurately   estimated. - Inferior vena cava: The vessel was dilated. The respirophasic   diameter changes were blunted (< 50%), consistent with elevated   central venous pressure.  Impressions:  - Challenging image quality.  Patient Profile     46 y.o. male with history of presumed nonischemic cardiomyopathy by nuclear stress test in 5366, chronic systolic CHF, COPD 2/2 ongoing tobacco abuse at 2 ppd since age 36, DM2, medication noncompliance, prior Neisseria meningitis PNA and bacteremia in 12/2015, vasovagal syncope, and obesity who presented to [facility] on 4/12 with 1-2 week history of increased shortness of breath, abdominal distention, and lower extremity swelling.   Assessment & Plan    1. Acute respiratory distress with hypoxia:  -Likely multifactorial including acute on chronic combined CHF secondary to medication and dietary noncompliance, possible COPD exacerbation, and anxiety  -On room air   2. Acute on chronic systolic CHF:  -Likely in the setting of medication and dietary noncompliance  -Has diuresed 3.5 L for the admission -Renal function bumped this morning, Lasix held -Restart PO  Lasix this evening or morning of 4/14 -Echo with improved LV systolic function from 44% to 45%, elevated CVP  -Continue Toprol-XL 75 mg daily, lisinopril 20 mg daily  -Titrate spironolactone to 25 mg daily -Strict I's and O's, daily weights  -CHF education  -Consider dietary consultation   3. Possible COPD exacerbation:  -Per internal medicine   4. Elevated troponin/atypical chest pain/nonobstructive CAD:  -Minimally elevated at 0.08 consistent with prior levels noted during multiple admissions and ED visits  -Likely in the setting of supply demand ischemia secondary to volume overload  -Prior stress test in 2016 nonischemic  -May benefit  from outpatient nuclear stress testing once breathing is back to baseline, should patient side to follow-up  -ASA  -Beta blocker as above  -Check lipid panel for further risk stratification  -Echo with improved EF as above  3. NSVT:  -Previously noted during prior hospitalization admission  -Has been on amiodarone since MYT/1173, though uncertain patient compliance  -Will hold amiodarone given asymptomatic and patient age  -Likely in the setting of the above cardiomyopathy and hypomagnesemia -Replete magnesium to goal of 2.0 -Continue Toprol as above  -Replete potassium to goal 4.0   4. Hypertensive heart disease:  -Improving  -Continue Toprol and diuresis as above  -Titrate antihypertensives throughout admission as needed  -Titrate spironolactone to 25 mg daily  5. Hyperglycemia:  -Check hemoglobin A1c  6. Severe right upper quadrant abdominal pain: -Severely tender to palpation -Defer imaging to internal medicine  7. AKI: -Hold Lasix as above  Signed, Christell Faith, PA-C [facility] Pager: [phone] 11/29/2016, 8:28 AM

## 2016-11-29 NOTE — Progress Notes (Signed)
UPon meeting patient at the beginning of this shift patient says "I want to go home".  Declines to give a reason. [name] notified. Patient educated on risks of leaving at this time. Signed AMA paper and placed in chart. IV and tele removed.

## 2016-11-29 NOTE — Progress Notes (Signed)
MD notified via text about increase in BUN/creat and IV lasix scheduled. I will reschedule IV lasix until MD rounds. I will continue to assess.

## 2016-11-29 NOTE — Care Management (Signed)
CM attempted to assess patient and he does not offer much to the conversatoin  Asked if he has completed his [facility] application and he says 'no." Reason given - I don't know.  He has been given applications on previous admissions. He has not kept appointments at the [facility].   He has been to medication management clinic to obtain his meds.  He is not interested in finding PCP- so tried to discuss the benefits of the [facility] [employer] application and patient says okay.  Provided patient with another application and list of local PCP. Discussed CM concerns for his well being and the completion of the applications are not difficult.   He does not seem interested in taking an active role in the management of his medical regime.  Currently not requiring oxygen.

## 2016-11-29 NOTE — Plan of Care (Signed)
Problem: Safety: Goal: Ability to remain free from injury will improve Outcome: Progressing Pt instructed to ask for assistance with activity as needed.

## 2016-11-29 NOTE — Progress Notes (Signed)
Ryan, [place] notified of low magnesium. I will await any new orders. I will continue to assess.

## 2016-11-29 NOTE — Progress Notes (Signed)
Appointment scheduled at the [facility] on December 04, 2016 at 9:20am.  Of note, he has not shown/cancelled his last 5 appointments and hasn't been seen by [place] since October 2016.

## 2016-11-29 NOTE — Discharge Instructions (Signed)
[facility] appointment on December 04, 2016 at 9:20am with [name], [place]. Please call [phone] to reschedule.

## 2016-11-29 NOTE — Progress Notes (Addendum)
[place] at [facility]                                                                                                                                                                                  Patient Demographics   Gabriel [name], is a 46 y.o. male, DOB - [birth year], UYQ:[id number]  Admit date - 11/28/2016   Admitting Physician Demetrios Loll, MD  Outpatient Primary MD for the patient is No PCP Per Patient   LOS - 1  Subjective: Patient continues to complain of shortness of breath also complains of pain in the right upper quadrant and the side of his abdomen    Review of Systems:   CONSTITUTIONAL: No documented fever. No fatigue, weakness. No weight gain, no weight loss.  EYES: No blurry or double vision.  ENT: No tinnitus. No postnasal drip. No redness of the oropharynx.  RESPIRATORY: No cough, no wheeze, no hemoptysis. Positive dyspnea.  CARDIOVASCULAR: No chest pain. No orthopnea. No palpitations. No syncope.  GASTROINTESTINAL: No nausea, no vomiting or diarrhea. Positive abdominal pain. No melena or hematochezia.  GENITOURINARY: No dysuria or hematuria.  ENDOCRINE: No polyuria or nocturia. No heat or cold intolerance.  HEMATOLOGY: No anemia. No bruising. No bleeding.  INTEGUMENTARY: No rashes. No lesions.  MUSCULOSKELETAL: No arthritis. No swelling. No gout.  NEUROLOGIC: No numbness, tingling, or ataxia. No seizure-type activity.  PSYCHIATRIC: No anxiety. No insomnia. No ADD.    Vitals:   Vitals:   11/29/16 0355 11/29/16 0500 11/29/16 0749 11/29/16 1103  BP: 127/77  (!) 143/89 (!) 136/93  Pulse: 68  70 69  Resp: 18  18 17   Temp: 97.5 F (36.4 C)  97.7 F (36.5 C) 98 F (36.7 C)  TempSrc: Oral  Oral Oral  SpO2: 98%  95% 94%  Weight:  254 lb 12.8 oz (115.6 kg)    Height:        Wt Readings from Last 3 Encounters:  11/29/16 254 lb 12.8 oz (115.6 kg)  10/30/16 250 lb 5 oz (113.5 kg)  09/19/16 245 lb (111.1 kg)     Intake/Output  Summary (Last 24 hours) at 11/29/16 1240 Last data filed at 11/29/16 1004  Gross per 24 hour  Intake              600 ml  Output             1850 ml  Net            -1250 ml    Physical Exam:   GENERAL: Pleasant-appearing in no apparent distress.  HEAD, EYES, EARS, NOSE AND THROAT: Atraumatic, normocephalic. Extraocular muscles are intact. Pupils equal and  reactive to light. Sclerae anicteric. No conjunctival injection. No oro-pharyngeal erythema.  NECK: Supple. There is no jugular venous distention. No bruits, no lymphadenopathy, no thyromegaly.  HEART: Regular rate and rhythm,. No murmurs, no rubs, no clicks.  LUNGS: Bilateral crackles at the bases No rales or rhonchi. No wheezes.  ABDOMEN: Soft, flat, right upper quadrant tenderness, nondistended. Has good bowel sounds. No hepatosplenomegaly appreciated.  EXTREMITIES: No evidence of any cyanosis, clubbing, or 1+ peripheral edema.  +2 pedal and radial pulses bilaterally.  NEUROLOGIC: The patient is alert, awake, and oriented x3 with no focal motor or sensory deficits appreciated bilaterally.  SKIN: Moist and warm with no rashes appreciated.  Psych: Not anxious, depressed LN: No inguinal LN enlargement    Antibiotics   Anti-infectives    None      Medications   Scheduled Meds: . aspirin  81 mg Oral Daily  . enoxaparin (LOVENOX) injection  40 mg Subcutaneous Q24H  . insulin aspart  0-5 Units Subcutaneous QHS  . insulin aspart  0-9 Units Subcutaneous TID WC  . lisinopril  20 mg Oral Daily  . magnesium oxide  400 mg Oral Daily  . metoprolol succinate  75 mg Oral Daily  . potassium chloride SA  10 mEq Oral Daily  . sodium chloride flush  3 mL Intravenous Q12H  . spironolactone  12.5 mg Oral Once  . [START ON 11/30/2016] spironolactone  25 mg Oral Daily   Continuous Infusions: PRN Meds:.sodium chloride, acetaminophen **OR** acetaminophen, albuterol, HYDROcodone-acetaminophen, morphine injection, nitroGLYCERIN, ondansetron  **OR** ondansetron (ZOFRAN) IV, senna-docusate, sodium chloride flush   Data Review:   Micro Results No results found for this or any previous visit (from the past 240 hour(s)).  Radiology Reports Dg Chest 2 View  Result Date: 11/28/2016 CLINICAL DATA:  Dyspnea EXAM: CHEST  2 VIEW COMPARISON:  10/28/2016 FINDINGS: Cardiomegaly and vascular pedicle widening. Interstitial coarsening with Kerley lines and fissural thickening. No noted pleural effusions. IMPRESSION: CHF. Electronically Signed   By: Monte Fantasia M.D.   On: 11/28/2016 08:15     CBC  Recent Labs Lab 11/28/16 0812 11/29/16 0627  WBC 9.1 8.5  HGB 14.6 14.8  HCT 44.1 45.8  PLT 140* 146*  MCV 79.0* 80.7  MCH 26.1 26.1  MCHC 33.0 32.4  RDW 17.0* 17.3*  LYMPHSABS 1.8  --   MONOABS 0.5  --   EOSABS 0.2  --   BASOSABS 0.1  --     Chemistries   Recent Labs Lab 11/28/16 0812 11/28/16 1359 11/29/16 0627  NA 137  --  140  K 3.6  --  3.7  CL 105  --  105  CO2 24  --  28  GLUCOSE 129*  --  130*  BUN 20  --  26*  CREATININE 0.91  --  1.41*  CALCIUM 8.8*  --  8.9  MG  --  1.6*  --   AST  --   --  24  ALT  --   --  21  ALKPHOS  --   --  65  BILITOT  --   --  1.4*   ------------------------------------------------------------------------------------------------------------------ estimated creatinine clearance is 85 mL/min (A) (by C-G formula based on SCr of 1.41 mg/dL (H)). ------------------------------------------------------------------------------------------------------------------ No results for input(s): HGBA1C in the last 72 hours. ------------------------------------------------------------------------------------------------------------------ No results for input(s): CHOL, HDL, LDLCALC, TRIG, CHOLHDL, LDLDIRECT in the last 72 hours. ------------------------------------------------------------------------------------------------------------------ No results for input(s): TSH, T4TOTAL, T3FREE,  THYROIDAB in the last 72 hours.  Invalid input(s):  FREET3 ------------------------------------------------------------------------------------------------------------------ No results for input(s): VITAMINB12, FOLATE, FERRITIN, TIBC, IRON, RETICCTPCT in the last 72 hours.  Coagulation profile No results for input(s): INR, PROTIME in the last 168 hours.  No results for input(s): DDIMER in the last 72 hours.  Cardiac Enzymes  Recent Labs Lab 11/28/16 1055 11/28/16 1359 11/28/16 2003  TROPONINI 0.08* 0.07* 0.07*   ------------------------------------------------------------------------------------------------------------------ Invalid input(s): POCBNP    Assessment & Plan  Patient is a 46 year old with shortness of breath  1. Acute on chronic Systolic CHF. Patient was diuresed and had good urine output now creatinine is elevated due to that Lasix has been held Per cardiology they will either restart Lasix orally tonight or tomorrow. His echo does show improvement in his ejection fraction compared to July 2017 Continue metoprolol and lisinopril   2. Acute on chronic COPD exasperationwill treat with nebulizers every 6 hours Place him on IV Solu-Medrol  3. Elevated troponin. Demand ischemia Due to above. Patient will need outpatient stress test per cardiology Continue aspirin  4. Right upper quadrant abdominal pain as well as pain on the side of his right abdomen appears to be a atypical I will check a lipase and pancreatic enzymes  5. Essential Hypertension. Continue home hypertension medications  6. Acute renal failure on chronic kidney disease stage III will monitor his renal function  7. Diabetes, contineu ssi he's not on any medications at home Hemoglobin A1c was 6.1 31 days ago   8. . 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 but being held per cardiology may not continue him based on his age and he is asymptomatic     Code Status Orders        Start      Ordered   11/28/16 1342  Full code  Continuous     11/28/16 1341    Code Status History    Date Active Date Inactive Code Status Order ID Comments User Context   10/28/2016  1:58 PM 10/30/2016  1:54 PM Full Code [id number]  Hillary Bow, MD ED   03/15/2016  7:58 AM 03/16/2016  3:08 PM Full Code [id number]  Harrie Foreman, MD Inpatient   02/16/2016  2:24 AM 02/17/2016  1:02 PM Full Code [id number]  Harrie Foreman, MD Inpatient   01/22/2016 11:51 PM 01/23/2016  4:41 PM Full Code [id number]  Demetrios Loll, MD ED   01/03/2016  2:22 PM 01/09/2016  2:54 PM Full Code [id number]  Lytle Butte, MD ED   05/17/2015 11:41 PM 05/19/2015  5:31 PM Full Code [id number]  Lance Coon, MD Inpatient   03/20/2015  8:26 AM 03/21/2015  3:27 PM Full Code [id number]  Harrie Foreman, MD Inpatient           Consults  Cardiology DVT Prophylaxis  Lovenox  Lab Results  Component Value Date   PLT 146 (L) 11/29/2016     Time Spent in minutes  15min  Greater than 50% of time spent in care coordination and counseling patient regarding the condition and plan of care.   Dustin Flock M.D on 11/29/2016 at 12:40 PM  Between 7am to 6pm - Pager - [phone]  After 6pm go to [email or web address] - password [email or web address] [facility]   Office  [phone]

## 2016-12-04 ENCOUNTER — Ambulatory Visit: Payer: Self-pay | Attending: Family | Admitting: Family

## 2016-12-04 ENCOUNTER — Encounter: Payer: Self-pay | Admitting: Family

## 2016-12-04 VITALS — BP 169/93 | HR 82 | Resp 20 | Ht 72.0 in | Wt 255.2 lb

## 2016-12-04 DIAGNOSIS — I429 Cardiomyopathy, unspecified: Secondary | ICD-10-CM | POA: Insufficient documentation

## 2016-12-04 DIAGNOSIS — J441 Chronic obstructive pulmonary disease with (acute) exacerbation: Secondary | ICD-10-CM | POA: Insufficient documentation

## 2016-12-04 DIAGNOSIS — Z7982 Long term (current) use of aspirin: Secondary | ICD-10-CM | POA: Insufficient documentation

## 2016-12-04 DIAGNOSIS — N183 Chronic kidney disease, stage 3 (moderate): Secondary | ICD-10-CM | POA: Insufficient documentation

## 2016-12-04 DIAGNOSIS — K828 Other specified diseases of gallbladder: Secondary | ICD-10-CM | POA: Insufficient documentation

## 2016-12-04 DIAGNOSIS — E1122 Type 2 diabetes mellitus with diabetic chronic kidney disease: Secondary | ICD-10-CM | POA: Insufficient documentation

## 2016-12-04 DIAGNOSIS — Z6834 Body mass index (BMI) 34.0-34.9, adult: Secondary | ICD-10-CM | POA: Insufficient documentation

## 2016-12-04 DIAGNOSIS — Z720 Tobacco use: Secondary | ICD-10-CM | POA: Insufficient documentation

## 2016-12-04 DIAGNOSIS — L409 Psoriasis, unspecified: Secondary | ICD-10-CM | POA: Insufficient documentation

## 2016-12-04 DIAGNOSIS — E669 Obesity, unspecified: Secondary | ICD-10-CM | POA: Insufficient documentation

## 2016-12-04 DIAGNOSIS — I130 Hypertensive heart and chronic kidney disease with heart failure and stage 1 through stage 4 chronic kidney disease, or unspecified chronic kidney disease: Secondary | ICD-10-CM | POA: Insufficient documentation

## 2016-12-04 DIAGNOSIS — I10 Essential (primary) hypertension: Secondary | ICD-10-CM

## 2016-12-04 DIAGNOSIS — F17210 Nicotine dependence, cigarettes, uncomplicated: Secondary | ICD-10-CM | POA: Insufficient documentation

## 2016-12-04 DIAGNOSIS — I5022 Chronic systolic (congestive) heart failure: Secondary | ICD-10-CM | POA: Insufficient documentation

## 2016-12-04 MED ORDER — TORSEMIDE 20 MG PO TABS: 40.0000 mg | ORAL_TABLET | Freq: Every day | ORAL | 3 refills | Status: DC

## 2016-12-04 NOTE — Patient Instructions (Addendum)
Stop furosemide (lasix)  Begin torsemide 40mg  daily (2 tablets)  Increase potassium to 1 tablet daily.  Begin magnesium daily.    [facility]   [phone]  [place] [address]  Hours: Tuesday: 4:15 pm - 7:30 pm  Wednesday: 9 am - 1 pm  Thursday: 1 pm - 7:30 pm  Friday - Monday: CLOSED

## 2016-12-04 NOTE — Progress Notes (Signed)
Patient ID: Gabriel [name] Age., male    DOB: September 09, 1970, 46 y.o.   MRN: [id number]  HPI  Mr Morefield is a 46 y/o male with a history of HTN, diabetes, COPD, CKD, asthma, current tobacco use and chronic heart failure.   Reviewed last echo report that was done 11/28/16 and it showed an EF of 40-45% along with mild MR. EF has improved from 30-35% July 2017.  Recently admitted 11/28/16 with HF exacerbation. Cardiology consult obtained. IV solu-medrol given for COPD exacerbation as well. Discharged home the next day. Admitted 10/28/16 with HF exacerbation. Initially treated with IV diuretics and transitioned to oral diuretics. Discharged home after 2 days. Has had numerous other admissions/ED visits in the last 6 months.  He presents today for a follow-up visit although hasn't been seen since October 2016. He presents with a chief complaint of moderate shortness of breath with minimal exertion. Is relieved upon rest. Says that he's been having difficulty with his breathing for "years" and it's affecting his ability to hold down a job. He has associated fatigue, cough, chest pain, palpitations and abdominal distention.    Past Medical History:  Diagnosis Date  . Asthma   . Cardiomyopathy ([place])   . CHF (congestive heart failure) ([place])   . CKD (chronic kidney disease), stage III   . COPD (chronic obstructive pulmonary disease) ([place])   . Diabetes mellitus without complication ([place])    Pre diabetic  . Hypertension    Resolved since weight loss  . Obesity (BMI 30.0-34.9)   . Psoriasis   . Systolic CHF [facility])    Past Surgical History:  Procedure Laterality Date  . AMPUTATION    . FINGER AMPUTATION     Traumatic   Family History  Problem Relation Age of Onset  . Diabetes Mellitus II Mother   . Hypertension Father   . Cancer Paternal Aunt   . Cancer Maternal Grandfather    Social History  Substance Use Topics  . Smoking status: Current Some Day Smoker    Packs/day: 0.00    Years: 33.00   Types: Cigarettes  . Smokeless tobacco: Never Used  . Alcohol use 1.8 oz/week    3 Cans of beer per week     Comment: occassionally   Allergies  Allergen Reactions  . Prednisone Other (See Comments)    Reaction: Hallucinations    Prior to Admission medications   Medication Sig Start Date End Date Taking? Authorizing Provider  amiodarone (PACERONE) 200 MG tablet Take 1 tablet (200 mg total) by mouth 2 (two) times daily. 09/19/16  Yes Earleen Newport, MD  amLODipine (NORVASC) 10 MG tablet Take 1 tablet (10 mg total) by mouth daily. 09/19/16  Yes Earleen Newport, MD  aspirin 81 MG chewable tablet Chew 1 tablet (81 mg total) by mouth daily. Reported on 02/08/2016 09/19/16  Yes Earleen Newport, MD  furosemide (LASIX) 40 MG tablet Take 1 tablet (40 mg total) by mouth daily. 10/30/16 10/30/17 Yes Shreyang Patel, MD  lisinopril (PRINIVIL,ZESTRIL) 20 MG tablet Take 1 tablet (20 mg total) by mouth daily. 09/19/16  Yes Earleen Newport, MD  metoprolol succinate (TOPROL-XL) 25 MG 24 hr tablet Take 2 tablets (50 mg total) by mouth daily. Take with or immediately following a meal. 10/30/16  Yes Dustin Flock, MD  potassium chloride SA (K-DUR,KLOR-CON) 20 MEQ tablet Take 0.5 tablets (10 mEq total) by mouth daily. 10/30/16  Yes Dustin Flock, MD    Review of Systems  Constitutional:  Positive for fatigue.  HENT: Negative for congestion, postnasal drip and sore throat.   Eyes: Negative.   Respiratory: Positive for cough and shortness of breath. Negative for chest tightness.   Cardiovascular: Positive for chest pain and palpitations. Negative for leg swelling.  Gastrointestinal: Positive for abdominal distention. Negative for abdominal pain.  Endocrine: Negative.   Genitourinary: Negative.   Musculoskeletal: Negative for back pain and neck pain.  Skin: Negative.   Allergic/Immunologic: Negative.   Neurological: Negative for dizziness and light-headedness.  Hematological: Negative for adenopathy.  Does not bruise/bleed easily.  Psychiatric/Behavioral: Negative for dysphoric mood, sleep disturbance and suicidal ideas. The patient is nervous/anxious.    Vitals:   12/04/16 0925  BP: (!) 169/93  Pulse: 82  Resp: 20  SpO2: 100%  Weight: 255 lb 4 oz (115.8 kg)  Height: 6' (1.829 m)   Wt Readings from Last 3 Encounters:  12/04/16 255 lb 4 oz (115.8 kg)  11/29/16 254 lb 12.8 oz (115.6 kg)  10/30/16 250 lb 5 oz (113.5 kg)   Lab Results  Component Value Date   CREATININE 1.41 (H) 11/29/2016   CREATININE 0.91 11/28/2016   CREATININE 1.15 10/30/2016     Physical Exam  Constitutional: He is oriented to person, place, and time. He appears well-developed and well-nourished.  HENT:  Head: Normocephalic and atraumatic.  Neck: Normal range of motion. Neck supple. No JVD present.  Cardiovascular: Normal rate and regular rhythm.   Pulmonary/Chest: Effort normal. He has no wheezes. He has no rales.  Abdominal: Soft. He exhibits distension. There is no tenderness.  Musculoskeletal: He exhibits no edema or tenderness.  Neurological: He is alert and oriented to person, place, and time.  Skin: Skin is warm and dry.  Psychiatric: He has a normal mood and affect. His behavior is normal. Thought content normal.  Nursing note and vitals reviewed.   Assessment & Plan:  1: Chronic heart failure with reduced ejection fraction- - NYHA class III - mildly fluid overloaded today - weigh daily and call for an overnight weight gain of >2 pounds or a weekly weight gain of >5 pounds - not adding salt and encouraged him to closely follow a 2000mg  sodium diet - will change his diuretic to torsemide 40mg  daily. Stop taking the furosemide - also will increase his potassium to a whole tablet (2meq) daily - add magnesium daily as he says that he occasionally gets cramps and then he'll skip his furosemide for a day or so. Reviewed last Mg level on 11/28/16 which was 1.6 - will check a BMP next week  2:  HTN- - BP elevated today. Diuretic being adjusted per above - BMP drawn on 11/29/16 reviewed; potassium 3.7 and GFR 58  3: Tobacco use- - continues to smoke 1/2 ppd - complete cessation discussed for 3 minutes with him  4: Gallbladder sludge- - this was noted on abdominal ultrasound done 11/29/16 - encouraged follow-up with [facility] and information to them was provided  Patient did not bring his medications nor a list. Each medication was verbally reviewed with the patient and he was encouraged to bring the bottles to every visit to confirm accuracy of list.  Return in 1 week or sooner for any questions/problems before then.

## 2016-12-06 ENCOUNTER — Emergency Department
Admission: EM | Admit: 2016-12-06 | Discharge: 2016-12-06 | Disposition: A | Discharge status: Home / Self Care | Payer: Self-pay | Attending: Emergency Medicine | Admitting: Emergency Medicine

## 2016-12-06 ENCOUNTER — Emergency Department: Payer: Self-pay

## 2016-12-06 ENCOUNTER — Encounter: Payer: Self-pay | Admitting: Emergency Medicine

## 2016-12-06 DIAGNOSIS — N183 Chronic kidney disease, stage 3 (moderate): Secondary | ICD-10-CM | POA: Insufficient documentation

## 2016-12-06 DIAGNOSIS — Z7982 Long term (current) use of aspirin: Secondary | ICD-10-CM | POA: Insufficient documentation

## 2016-12-06 DIAGNOSIS — R1011 Right upper quadrant pain: Secondary | ICD-10-CM

## 2016-12-06 DIAGNOSIS — J449 Chronic obstructive pulmonary disease, unspecified: Secondary | ICD-10-CM | POA: Insufficient documentation

## 2016-12-06 DIAGNOSIS — Z79899 Other long term (current) drug therapy: Secondary | ICD-10-CM | POA: Insufficient documentation

## 2016-12-06 DIAGNOSIS — K819 Cholecystitis, unspecified: Secondary | ICD-10-CM

## 2016-12-06 DIAGNOSIS — I5023 Acute on chronic systolic (congestive) heart failure: Secondary | ICD-10-CM | POA: Insufficient documentation

## 2016-12-06 DIAGNOSIS — J45909 Unspecified asthma, uncomplicated: Secondary | ICD-10-CM | POA: Insufficient documentation

## 2016-12-06 DIAGNOSIS — I130 Hypertensive heart and chronic kidney disease with heart failure and stage 1 through stage 4 chronic kidney disease, or unspecified chronic kidney disease: Secondary | ICD-10-CM | POA: Insufficient documentation

## 2016-12-06 DIAGNOSIS — F17210 Nicotine dependence, cigarettes, uncomplicated: Secondary | ICD-10-CM | POA: Insufficient documentation

## 2016-12-06 DIAGNOSIS — N200 Calculus of kidney: Secondary | ICD-10-CM | POA: Insufficient documentation

## 2016-12-06 LAB — URINALYSIS, COMPLETE (UACMP) WITH MICROSCOPIC
Bacteria, UA: NONE SEEN
Bilirubin Urine: NEGATIVE
Glucose, UA: NEGATIVE mg/dL
Hgb urine dipstick: NEGATIVE
Ketones, ur: 5 mg/dL — AB
Leukocytes, UA: NEGATIVE
Nitrite: NEGATIVE
Protein, ur: 100 mg/dL — AB
RBC / HPF: NONE SEEN RBC/hpf (ref 0–5)
Specific Gravity, Urine: 1.025 (ref 1.005–1.030)
Squamous Epithelial / LPF: NONE SEEN
pH: 5 (ref 5.0–8.0)

## 2016-12-06 LAB — LIPASE, BLOOD: Lipase: 49 U/L (ref 11–51)

## 2016-12-06 LAB — COMPREHENSIVE METABOLIC PANEL
ALT: 21 U/L (ref 17–63)
AST: 26 U/L (ref 15–41)
Albumin: 4.1 g/dL (ref 3.5–5.0)
Alkaline Phosphatase: 69 U/L (ref 38–126)
Anion gap: 8 (ref 5–15)
BUN: 26 mg/dL — ABNORMAL HIGH (ref 6–20)
CO2: 24 mmol/L (ref 22–32)
Calcium: 9.5 mg/dL (ref 8.9–10.3)
Chloride: 106 mmol/L (ref 101–111)
Creatinine, Ser: 1.29 mg/dL — ABNORMAL HIGH (ref 0.61–1.24)
GFR calc Af Amer: >60 mL/min (ref >60)
GFR calc non Af Amer: >60 mL/min (ref >60)
Glucose, Bld: 131 mg/dL — ABNORMAL HIGH (ref 65–99)
Potassium: 4.1 mmol/L (ref 3.5–5.1)
Sodium: 138 mmol/L (ref 135–145)
Total Bilirubin: 1.8 mg/dL — ABNORMAL HIGH (ref 0.3–1.2)
Total Protein: 7.7 g/dL (ref 6.5–8.1)

## 2016-12-06 LAB — BILIRUBIN, DIRECT: Bilirubin, Direct: 0.2 mg/dL (ref 0.1–0.5)

## 2016-12-06 LAB — CBC
HCT: 49.6 % (ref 40.0–52.0)
Hemoglobin: 16.3 g/dL (ref 13.0–18.0)
MCH: 26.1 pg (ref 26.0–34.0)
MCHC: 32.8 g/dL (ref 32.0–36.0)
MCV: 79.5 fL — ABNORMAL LOW (ref 80.0–100.0)
Platelets: 188 10*3/uL (ref 150–440)
RBC: 6.23 MIL/uL — ABNORMAL HIGH (ref 4.40–5.90)
RDW: 17.6 % — ABNORMAL HIGH (ref 11.5–14.5)
WBC: 11.1 10*3/uL — ABNORMAL HIGH (ref 3.8–10.6)

## 2016-12-06 MED ORDER — MORPHINE SULFATE (PF) 4 MG/ML IV SOLN
INTRAVENOUS | Status: AC
  Administered 2016-12-06: 8 mg via INTRAVENOUS
  Filled 2016-12-06: qty 1

## 2016-12-06 MED ORDER — ONDANSETRON 4 MG PO TBDP: 4.0000 mg | ORAL_TABLET | Freq: Three times a day (TID) | ORAL | 0 refills | Status: DC | PRN

## 2016-12-06 MED ORDER — MORPHINE SULFATE (PF) 4 MG/ML IV SOLN
8.0000 mg | Freq: Once | INTRAVENOUS | Status: AC
  Administered 2016-12-06: 8 mg via INTRAVENOUS

## 2016-12-06 MED ORDER — ONDANSETRON HCL 4 MG/2ML IJ SOLN
4.0000 mg | Freq: Once | INTRAMUSCULAR | Status: AC
  Administered 2016-12-06: 4 mg via INTRAVENOUS

## 2016-12-06 MED ORDER — KETOROLAC TROMETHAMINE 30 MG/ML IJ SOLN
15.0000 mg | Freq: Once | INTRAMUSCULAR | Status: AC
  Administered 2016-12-06: 15 mg via INTRAVENOUS
  Filled 2016-12-06: qty 1

## 2016-12-06 MED ORDER — ONDANSETRON 4 MG PO TBDP
4.0000 mg | ORAL_TABLET | Freq: Once | ORAL | Status: AC | PRN
  Administered 2016-12-06: 4 mg via ORAL
  Filled 2016-12-06: qty 1

## 2016-12-06 MED ORDER — HYDROCODONE-ACETAMINOPHEN 5-325 MG PO TABS: 1.0000 | ORAL_TABLET | Freq: Four times a day (QID) | ORAL | 0 refills | Status: DC | PRN

## 2016-12-06 MED ORDER — MORPHINE SULFATE (PF) 4 MG/ML IV SOLN
INTRAVENOUS | Status: DC
Start: 2016-12-06 — End: 2016-12-06
  Filled 2016-12-06: qty 1

## 2016-12-06 MED ORDER — IBUPROFEN 600 MG PO TABS: 600.0000 mg | ORAL_TABLET | Freq: Three times a day (TID) | ORAL | 0 refills | Status: DC | PRN

## 2016-12-06 MED ORDER — ONDANSETRON HCL 4 MG/2ML IJ SOLN
INTRAMUSCULAR | Status: AC
  Administered 2016-12-06: 4 mg via INTRAVENOUS
  Filled 2016-12-06: qty 2

## 2016-12-06 MED ORDER — SODIUM CHLORIDE 0.9 % IV BOLUS (SEPSIS)
1000.0000 mL | Freq: Once | INTRAVENOUS | Status: AC
  Administered 2016-12-06: 1000 mL via INTRAVENOUS

## 2016-12-06 MED ORDER — IOPAMIDOL (ISOVUE-300) INJECTION 61%
100.0000 mL | Freq: Once | INTRAVENOUS | Status: AC | PRN
  Administered 2016-12-06: 100 mL via INTRAVENOUS
  Filled 2016-12-06: qty 100

## 2016-12-06 NOTE — ED Triage Notes (Signed)
Patient presents to the ED with severe upper abdominal pain that runs across patient's abdomen.  Patient states he was in the ED recently and left prior to being discharged.  Patient states abdominal pain began this morning after he ate a breakfast of steak and then started to work.  Patient states, "It feels like someone is using my stomach for a punching bag."  Patient states his heart failure doctor told him he had some galbladder problems but patient has not seen a GI doctor.  Patient appears uncomfortable in triage.

## 2016-12-06 NOTE — ED Provider Notes (Signed)
[facility] Emergency Department Provider Note  ____________________________________________   First MD Initiated Contact with Patient 12/06/16 1512     (approximate)  I have reviewed the triage vital signs and the nursing notes.   HISTORY  Chief Complaint Abdominal Pain    HPI Gabriel [name]. is a 46 y.o. male who comes to the emergency department with sudden onset severe left flank pain that began today while at work. Pain has been severe aching diffuse abdomen worse on the left nothing makes it better or worse. She's been nauseated but has not vomited. No chest pain or shortness of breath. He has no history of abdominal surgeries. He has been passing flatus. No fevers or chills.   Past Medical History:  Diagnosis Date  . Asthma   . Cardiomyopathy ([place])   . CHF (congestive heart failure) ([place])   . CKD (chronic kidney disease), stage III   . COPD (chronic obstructive pulmonary disease) ([place])   . Diabetes mellitus without complication ([place])    Pre diabetic  . Hypertension    Resolved since weight loss  . Obesity (BMI 30.0-34.9)   . Psoriasis   . Systolic CHF [facility])     Patient Active Problem List   Diagnosis Date Noted  . Tobacco use 12/04/2016  . Gallbladder sludge 12/04/2016  . Acute on chronic systolic CHF (congestive heart failure) ([place]) 11/28/2016  . HCAP (healthcare-associated pneumonia) 03/15/2016  . Coronary artery disease, non-occlusive 03/15/2016  . Chest pain 02/16/2016  . Orthostatic hypotension 01/23/2016  . Diarrhea 01/23/2016  . Hypokalemia 01/23/2016  . Hypomagnesemia 01/23/2016  . Syncope 01/22/2016  . Congestive dilated cardiomyopathy ([place]) 01/17/2016  . NSVT (nonsustained ventricular tachycardia) ([place])   . 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 CAP (community acquired pneumonia)   . Arterial hypotension   . Elevated troponin   . Chronic systolic heart failure ([place])   . Sepsis ([place]) 01/03/2016  . Type II  diabetes mellitus ([place]) 05/17/2015  . Hyperlipidemia 05/17/2015  . COPD (chronic obstructive pulmonary disease) ([place]) 05/17/2015  . HTN (hypertension) 03/31/2015    Past Surgical History:  Procedure Laterality Date  . AMPUTATION    . FINGER AMPUTATION     Traumatic    Prior to Admission medications   Medication Sig Start Date End Date Taking? Authorizing Provider  amiodarone (PACERONE) 200 MG tablet Take 1 tablet (200 mg total) by mouth 2 (two) times daily. 09/19/16   Earleen Newport, MD  amLODipine (NORVASC) 10 MG tablet Take 1 tablet (10 mg total) by mouth daily. 09/19/16   Earleen Newport, MD  aspirin 81 MG chewable tablet Chew 1 tablet (81 mg total) by mouth daily. Reported on 02/08/2016 09/19/16   Earleen Newport, MD  HYDROcodone-acetaminophen [facility]) 5-325 MG tablet Take 1 tablet by mouth every 6 (six) hours as needed for severe pain. 12/06/16   Darel Hong, MD  ibuprofen (ADVIL,MOTRIN) 600 MG tablet Take 1 tablet (600 mg total) by mouth every 8 (eight) hours as needed. 12/06/16   Darel Hong, MD  lisinopril (PRINIVIL,ZESTRIL) 20 MG tablet Take 1 tablet (20 mg total) by mouth daily. 09/19/16   Earleen Newport, MD  metoprolol succinate (TOPROL-XL) 25 MG 24 hr tablet Take 2 tablets (50 mg total) by mouth daily. Take with or immediately following a meal. 10/30/16   Dustin Flock, MD  ondansetron (ZOFRAN ODT) 4 MG disintegrating tablet Take 1 tablet (4 mg total) by mouth every 8 (eight) hours as needed  for nausea or vomiting. 12/06/16   Darel Hong, MD  potassium chloride SA (K-DUR,KLOR-CON) 20 MEQ tablet Take 0.5 tablets (10 mEq total) by mouth daily. 10/30/16   Dustin Flock, MD  torsemide (DEMADEX) 20 MG tablet Take 2 tablets (40 mg total) by mouth daily. 12/04/16 01/03/17  Alisa Graff, FNP    Allergies Prednisone  Family History  Problem Relation Age of Onset  . Diabetes Mellitus II Mother   . Hypertension Father   . Cancer Paternal Aunt   . Cancer Maternal  Grandfather     Social History Social History  Substance Use Topics  . Smoking status: Current Some Day Smoker    Packs/day: 0.00    Years: 33.00    Types: Cigarettes  . Smokeless tobacco: Never Used  . Alcohol use 1.8 oz/week    3 Cans of beer per week     Comment: occassionally    Review of Systems Constitutional: No fever/chills Eyes: No visual changes. ENT: No sore throat. Cardiovascular: Denies chest pain. Respiratory: Denies shortness of breath. Gastrointestinal: Positive abdominal pain.  Positive nausea, no vomiting.  No diarrhea.  No constipation. Genitourinary: Negative for dysuria. Musculoskeletal: Negative for back pain. Skin: Negative for rash. Neurological: Negative for headaches, focal weakness or numbness.  10-point ROS otherwise negative.  ____________________________________________   PHYSICAL EXAM:  VITAL SIGNS: ED Triage Vitals  Enc Vitals Group     BP --      Pulse Rate 12/06/16 1151 83     Resp 12/06/16 1151 20     Temp 12/06/16 1151 98 F (36.7 C)     Temp Source 12/06/16 1151 Oral     SpO2 12/06/16 1151 97 %     Weight 12/06/16 1152 253 lb (114.8 kg)     Height 12/06/16 1152 6' (1.829 m)     Head Circumference --      Peak Flow --      Pain Score 12/06/16 1150 10     Pain Loc --      Pain Edu? --      Excl. in [place]? --     Constitutional: Alert and oriented x 4 Appears uncomfortable moaning and holding his left side Eyes: PERRL EOMI. Head: Atraumatic. Nose: No congestion/rhinnorhea. Mouth/Throat: No trismus Neck: No stridor.   Cardiovascular: Normal rate, regular rhythm. Grossly normal heart sounds.  Good peripheral circulation. Respiratory: Normal respiratory effort.  No retractions. Lungs CTAB and moving good air Gastrointestinal: Soft nondistended diffuse mild tenderness with no focality no rebound or guarding no peritonitis no costovertebral tenderness Musculoskeletal: No lower extremity edema   Neurologic:  Normal speech and  language. No gross focal neurologic deficits are appreciated. Skin:  Skin is warm, dry and intact. No rash noted. Psychiatric: Mood and affect are normal. Speech and behavior are normal.     ____________________________________________   LABS (all labs ordered are listed, but only abnormal results are displayed)  Labs Reviewed  COMPREHENSIVE METABOLIC PANEL - Abnormal; Notable for the following:       Result Value   Glucose, Bld 131 (*)    BUN 26 (*)    Creatinine, Ser 1.29 (*)    Total Bilirubin 1.8 (*)    All other components within normal limits  CBC - Abnormal; Notable for the following:    WBC 11.1 (*)    RBC 6.23 (*)    MCV 79.5 (*)    RDW 17.6 (*)    All other components within normal limits  URINALYSIS,  COMPLETE (UACMP) WITH MICROSCOPIC - Abnormal; Notable for the following:    Color, Urine YELLOW (*)    APPearance HAZY (*)    Ketones, ur 5 (*)    Protein, ur 100 (*)    All other components within normal limits  LIPASE, BLOOD  BILIRUBIN, DIRECT    Slightly elevated white count is quite nonspecific __________________________________________  EKG   ____________________________________________  RADIOLOGY CT scan showing nonobstructive left-sided kidney stone ____________________________________________   PROCEDURES  Procedure(s) performed: no  Procedures  Critical Care performed: no  ____________________________________________   INITIAL IMPRESSION / ASSESSMENT AND PLAN / ED COURSE  Pertinent labs & imaging results that were available during my care of the patient were reviewed by me and considered in my medical decision making (see chart for details).        _____----------------------------------------- 5:50 PM on 12/06/2016 -----------------------------------------  The patient's pain is improved and fortunately his CT scan is negative for acute pathology other than nonobstructed left sided kidney stone. Unclear if this is the complete  etiology of the patient's pain, however at this point he has advanced imaging that is negative his abdominal exam is benign and he is able to eat and drink. I will discharge him home with a short course of pain medication and strict return precautions as well as a close abdominal recheck. He is discharged home in improved condition. _______________________________________   FINAL CLINICAL IMPRESSION(S) / ED DIAGNOSES  Final diagnoses:  Kidney stone      NEW MEDICATIONS STARTED DURING THIS VISIT:  Discharge Medication List as of 12/06/2016  5:48 PM    START taking these medications   Details  HYDROcodone-acetaminophen (NORCO) 5-325 MG tablet Take 1 tablet by mouth every 6 (six) hours as needed for severe pain., Starting Fri 12/06/2016, Print    ibuprofen (ADVIL,MOTRIN) 600 MG tablet Take 1 tablet (600 mg total) by mouth every 8 (eight) hours as needed., Starting Fri 12/06/2016, Print    ondansetron (ZOFRAN ODT) 4 MG disintegrating tablet Take 1 tablet (4 mg total) by mouth every 8 (eight) hours as needed for nausea or vomiting., Starting Fri 12/06/2016, Print         Note:  This document was prepared using Dragon voice recognition software and may include unintentional dictation errors.     Darel Hong, MD 12/07/16 1141

## 2016-12-06 NOTE — ED Notes (Signed)
Patient transported to Ultrasound 

## 2016-12-06 NOTE — Discharge Instructions (Signed)
Please take all of your pain medications as prescribed and follow up with her primary care physician on Monday for recheck. Return to the emergency department sooner for any new or worsening symptoms such as worsening pain, if you cannot eat or drink, if you develop a fever, or for any other concerns.  It was a pleasure to take care of you today, and thank you for coming to our emergency department.  If you have any questions or concerns before leaving please ask the nurse to grab me and I'm more than happy to go through your aftercare instructions again.  If you were prescribed any opioid pain medication today such as Norco, Vicodin, Percocet, morphine, hydrocodone, or oxycodone please make sure you do not drive when you are taking this medication as it can alter your ability to drive safely.  If you have any concerns once you are home that you are not improving or are in fact getting worse before you can make it to your follow-up appointment, please do not hesitate to call [phone] and come back for further evaluation.  Darel Hong MD  Results for orders placed or performed during the hospital encounter of 12/06/16  Lipase, blood  Result Value Ref Range   Lipase 49 11 - 51 U/L  Comprehensive metabolic panel  Result Value Ref Range   Sodium 138 135 - 145 mmol/L   Potassium 4.1 3.5 - 5.1 mmol/L   Chloride 106 101 - 111 mmol/L   CO2 24 22 - 32 mmol/L   Glucose, Bld 131 (H) 65 - 99 mg/dL   BUN 26 (H) 6 - 20 mg/dL   Creatinine, Ser 1.29 (H) 0.61 - 1.24 mg/dL   Calcium 9.5 8.9 - 10.3 mg/dL   Total Protein 7.7 6.5 - 8.1 g/dL   Albumin 4.1 3.5 - 5.0 g/dL   AST 26 15 - 41 U/L   ALT 21 17 - 63 U/L   Alkaline Phosphatase 69 38 - 126 U/L   Total Bilirubin 1.8 (H) 0.3 - 1.2 mg/dL   GFR calc non Af Amer >60 >60 mL/min   GFR calc Af Amer >60 >60 mL/min   Anion gap 8 5 - 15  CBC  Result Value Ref Range   WBC 11.1 (H) 3.8 - 10.6 K/uL   RBC 6.23 (H) 4.40 - 5.90 MIL/uL   Hemoglobin 16.3 13.0 - 18.0  g/dL   HCT 49.6 40.0 - 52.0 %   MCV 79.5 (L) 80.0 - 100.0 fL   MCH 26.1 26.0 - 34.0 pg   MCHC 32.8 32.0 - 36.0 g/dL   RDW 17.6 (H) 11.5 - 14.5 %   Platelets 188 150 - 440 K/uL  Urinalysis, Complete w Microscopic  Result Value Ref Range   Color, Urine YELLOW (A) YELLOW   APPearance HAZY (A) CLEAR   Specific Gravity, Urine 1.025 1.005 - 1.030   pH 5.0 5.0 - 8.0   Glucose, UA NEGATIVE NEGATIVE mg/dL   Hgb urine dipstick NEGATIVE NEGATIVE   Bilirubin Urine NEGATIVE NEGATIVE   Ketones, ur 5 (A) NEGATIVE mg/dL   Protein, ur 100 (A) NEGATIVE mg/dL   Nitrite NEGATIVE NEGATIVE   Leukocytes, UA NEGATIVE NEGATIVE   RBC / HPF NONE SEEN 0 - 5 RBC/hpf   WBC, UA 0-5 0 - 5 WBC/hpf   Bacteria, UA NONE SEEN NONE SEEN   Squamous Epithelial / LPF NONE SEEN NONE SEEN   Mucous PRESENT    Hyaline Casts, UA PRESENT   Bilirubin, direct  Result Value Ref Range   Bilirubin, Direct 0.2 0.1 - 0.5 mg/dL   Dg Chest 2 View  Result Date: 11/28/2016 CLINICAL DATA:  Dyspnea EXAM: CHEST  2 VIEW COMPARISON:  10/28/2016 FINDINGS: Cardiomegaly and vascular pedicle widening. Interstitial coarsening with Kerley lines and fissural thickening. No noted pleural effusions. IMPRESSION: CHF. Electronically Signed   By: Monte Fantasia M.D.   On: 11/28/2016 08:15   US Abdomen Complete  Result Date: 11/29/2016 CLINICAL DATA:  Intermittent RIGHT upper quadrant pain for 5 years, worsening for 1 day. History of chronic kidney disease, diabetes. EXAM: ABDOMEN ULTRASOUND COMPLETE COMPARISON:  Abdominal ultrasound Jan 14, 2016 FINDINGS: Gallbladder: Gallbladder wall remains thickened at 4 mm though, decreased from prior examination. Minimal echogenic persistent layering sludge with mild pericholecystic fluid and, sonographic Murphy's sign was elicited. Common bile duct: Diameter: 3 mm Liver: No focal lesion identified. Mildly echogenic compatible with steatosis. IVC: No abnormality visualized. Pancreas: Visualized portion  unremarkable. Spleen: Size and appearance within normal limits. Right Kidney: Length: 11.3 cm. Echogenicity within normal limits. No mass or hydronephrosis visualized. Left Kidney: Length: 10.9 cm. Echogenicity within normal limits. No mass or hydronephrosis visualized. Abdominal aorta: No aneurysm visualized. Distal aorta obscured by bowel gas. Other findings: None. IMPRESSION: Sonographic findings of acute cholecystitis. Hepatic steatosis. Electronically Signed   By: Elon Alas M.D.   On: 11/29/2016 16:16   Ct Abdomen Pelvis W Contrast  Result Date: 12/06/2016 CLINICAL DATA:  Upper abdominal pain EXAM: CT ABDOMEN AND PELVIS WITH CONTRAST TECHNIQUE: Multidetector CT imaging of the abdomen and pelvis was performed using the standard protocol following bolus administration of intravenous contrast. CONTRAST:  180mL ISOVUE-300 IOPAMIDOL (ISOVUE-300) INJECTION 61% COMPARISON:  Ultrasound from earlier in the same day FINDINGS: Lower chest: No acute abnormality. Hepatobiliary: No focal liver abnormality is seen. No gallstones, gallbladder wall thickening, or biliary dilatation. Pancreas: Unremarkable. No pancreatic ductal dilatation or surrounding inflammatory changes. Spleen: Normal in size without focal abnormality. Adrenals/Urinary Tract: The adrenal glands are within normal limits. The right kidney demonstrates a normal enhancement pattern with the exception of a few small cysts. Normal enhancement is noted on the left although a tiny nonobstructing stone is noted in the lower pole of the left kidney. The ureters are within normal limits. The bladder is well distended. Stomach/Bowel: Stomach is within normal limits. Appendix appears normal. No evidence of bowel wall thickening, distention, or inflammatory changes. Vascular/Lymphatic: Aortic atherosclerosis. No enlarged abdominal or pelvic lymph nodes. Reproductive: Prostate is unremarkable. Other: No abdominal wall hernia or abnormality. No abdominopelvic  ascites. Musculoskeletal: No acute or significant osseous findings. IMPRESSION: Tiny nonobstructing left renal stone. No acute abnormality noted. Electronically Signed   By: Inez Catalina M.D.   On: 12/06/2016 15:57   US Abdomen Limited Ruq  Result Date: 12/06/2016 CLINICAL DATA:  RIGHT upper quadrant pain. EXAM: US ABDOMEN LIMITED - RIGHT UPPER QUADRANT COMPARISON:  Ultrasound 11/29/2016 FINDINGS: Gallbladder: Mildly contracted. No gallstones. Negative sonographic Murphy's sign. Common bile duct: Diameter: Normal at 3 mm Liver: No focal lesion identified. Within normal limits in parenchymal echogenicity. IMPRESSION: 1. Contracted gallbladder. 2. No evidence cholecystitis. Electronically Signed   By: Suzy Bouchard M.D.   On: 12/06/2016 15:24

## 2016-12-06 NOTE — ED Notes (Signed)
Transported to CT 

## 2016-12-08 NOTE — Discharge Summary (Signed)
[place] at [facility] 46 y.o., DOB July 26, 1971, MRN [id number]. Admission date: 11/28/2016 Discharge Date 12/08/2016 Primary MD No PCP Per Patient Admitting Physician Demetrios Loll, MD  Admission Diagnosis  Acute on chronic systolic congestive heart failure (HCC) [I50.23] Nonspecific chest pain [R07.9]  Discharge Diagnosis   Active Problems:   Acute on chronic systolic CHF (congestive heart failure) ([place])   Acute on chronic copd exasperation   Elevated troponin due demand ischemia   Right upper quadrant abdominal pain unclear source   Essential htn  ARF on CKD stage 3  DM2  h/o svt        Hospital Course   Gabriel [name]  is a 46 y.o. male with a known history of Chronic systolic CHF, cardiomyopathy, COPD, hypertension and diabetes. The patient presented to the ED with chest pain and shortness breath since this morning. The chest pain is in substernal area, tight and sharp, constant 10 out of 10 without radiation. He denies any fever or chills. But has orthopnea and nocturnal dyspnea and leg edema. Chest x-ray show CHF. He was treated with IV Lasix in the ED. Pt was admited to hospital further w/u was done for his sob and abdominal pain. Pt breathing improved after treatment for chf and copd exaceberation. Further w/u for his abdominal pain was unrevealing.              Consults  None  Significant Tests:  See full reports for all details     Dg Chest 2 View  Result Date: 11/28/2016 CLINICAL DATA:  Dyspnea EXAM: CHEST  2 VIEW COMPARISON:  10/28/2016 FINDINGS: Cardiomegaly and vascular pedicle widening. Interstitial coarsening with Kerley lines and fissural thickening. No noted pleural effusions. IMPRESSION: CHF. Electronically Signed   By: Monte Fantasia M.D.   On: 11/28/2016 08:15   US Abdomen Complete  Result Date: 11/29/2016 CLINICAL DATA:  Intermittent RIGHT upper quadrant pain for 5 years, worsening for 1 day. History  of chronic kidney disease, diabetes. EXAM: ABDOMEN ULTRASOUND COMPLETE COMPARISON:  Abdominal ultrasound Jan 14, 2016 FINDINGS: Gallbladder: Gallbladder wall remains thickened at 4 mm though, decreased from prior examination. Minimal echogenic persistent layering sludge with mild pericholecystic fluid and, sonographic Murphy's sign was elicited. Common bile duct: Diameter: 3 mm Liver: No focal lesion identified. Mildly echogenic compatible with steatosis. IVC: No abnormality visualized. Pancreas: Visualized portion unremarkable. Spleen: Size and appearance within normal limits. Right Kidney: Length: 11.3 cm. Echogenicity within normal limits. No mass or hydronephrosis visualized. Left Kidney: Length: 10.9 cm. Echogenicity within normal limits. No mass or hydronephrosis visualized. Abdominal aorta: No aneurysm visualized. Distal aorta obscured by bowel gas. Other findings: None. IMPRESSION: Sonographic findings of acute cholecystitis. Hepatic steatosis. Electronically Signed   By: Elon Alas M.D.   On: 11/29/2016 16:16   Ct Abdomen Pelvis W Contrast  Result Date: 12/06/2016 CLINICAL DATA:  Upper abdominal pain EXAM: CT ABDOMEN AND PELVIS WITH CONTRAST TECHNIQUE: Multidetector CT imaging of the abdomen and pelvis was performed using the standard protocol following bolus administration of intravenous contrast. CONTRAST:  185mL ISOVUE-300 IOPAMIDOL (ISOVUE-300) INJECTION 61% COMPARISON:  Ultrasound from earlier in the same day FINDINGS: Lower chest: No acute abnormality. Hepatobiliary: No focal liver abnormality is seen. No gallstones, gallbladder wall thickening, or biliary dilatation. Pancreas: Unremarkable. No pancreatic ductal dilatation or surrounding inflammatory changes. Spleen: Normal in size without focal abnormality. Adrenals/Urinary Tract: The adrenal glands are within normal limits. The right kidney demonstrates a normal enhancement  pattern with the exception of a few small cysts. Normal  enhancement is noted on the left although a tiny nonobstructing stone is noted in the lower pole of the left kidney. The ureters are within normal limits. The bladder is well distended. Stomach/Bowel: Stomach is within normal limits. Appendix appears normal. No evidence of bowel wall thickening, distention, or inflammatory changes. Vascular/Lymphatic: Aortic atherosclerosis. No enlarged abdominal or pelvic lymph nodes. Reproductive: Prostate is unremarkable. Other: No abdominal wall hernia or abnormality. No abdominopelvic ascites. Musculoskeletal: No acute or significant osseous findings. IMPRESSION: Tiny nonobstructing left renal stone. No acute abnormality noted. Electronically Signed   By: Inez Catalina M.D.   On: 12/06/2016 15:57   US Abdomen Limited Ruq  Result Date: 12/06/2016 CLINICAL DATA:  RIGHT upper quadrant pain. EXAM: US ABDOMEN LIMITED - RIGHT UPPER QUADRANT COMPARISON:  Ultrasound 11/29/2016 FINDINGS: Gallbladder: Mildly contracted. No gallstones. Negative sonographic Murphy's sign. Common bile duct: Diameter: Normal at 3 mm Liver: No focal lesion identified. Within normal limits in parenchymal echogenicity. IMPRESSION: 1. Contracted gallbladder. 2. No evidence cholecystitis. Electronically Signed   By: Suzy Bouchard M.D.   On: 12/06/2016 15:24       Today   Subjective:   [name]  Feels better sob improved  Objective:   Blood pressure (!) 174/94, pulse 82, temperature 98.6 F (37 C), temperature source Oral, resp. rate 18, height 6' (1.829 m), weight 254 lb 12.8 oz (115.6 kg), SpO2 96 %.  . No intake or output data in the 24 hours ending 12/08/16 1524  Exam VITAL SIGNS: Blood pressure (!) 174/94, pulse 82, temperature 98.6 F (37 C), temperature source Oral, resp. rate 18, height 6' (1.829 m), weight 254 lb 12.8 oz (115.6 kg), SpO2 96 %.  GENERAL:  46 y.o.-year-old patient lying in the bed with no acute distress.  EYES: Pupils equal, round, reactive to light and  accommodation. No scleral icterus. Extraocular muscles intact.  HEENT: Head atraumatic, normocephalic. Oropharynx and nasopharynx clear.  NECK:  Supple, no jugular venous distention. No thyroid enlargement, no tenderness.  LUNGS: Normal breath sounds bilaterally, no wheezing, rales,rhonchi or crepitation. No use of accessory muscles of respiration.  CARDIOVASCULAR: S1, S2 normal. No murmurs, rubs, or gallops.  ABDOMEN: Soft, nontender, nondistended. Bowel sounds present. No organomegaly or mass.  EXTREMITIES: No pedal edema, cyanosis, or clubbing.  NEUROLOGIC: Cranial nerves II through XII are intact. Muscle strength 5/5 in all extremities. Sensation intact. Gait not checked.  PSYCHIATRIC: The patient is alert and oriented x 3.  SKIN: No obvious rash, lesion, or ulcer.   Data Review     CBC w Diff:  Lab Results  Component Value Date   WBC 11.1 (H) 12/06/2016   HGB 16.3 12/06/2016   HGB 15.5 10/07/2013   HCT 49.6 12/06/2016   HCT 46.1 10/07/2013   PLT 188 12/06/2016   PLT 227 10/07/2013   LYMPHOPCT 20 11/28/2016   LYMPHOPCT 29.3 08/26/2012   MONOPCT 6 11/28/2016   MONOPCT 11.3 08/26/2012   EOSPCT 2 11/28/2016   EOSPCT 1.0 08/26/2012   BASOPCT 1 11/28/2016   BASOPCT 0.7 08/26/2012   CMP:  Lab Results  Component Value Date   NA 138 12/06/2016   NA 142 01/17/2016   NA 139 10/07/2013   K 4.1 12/06/2016   K 3.8 10/07/2013   CL 106 12/06/2016   CL 107 10/07/2013   CO2 24 12/06/2016   CO2 25 10/07/2013   BUN 26 (H) 12/06/2016   BUN 25 (H) 01/17/2016  BUN 10 10/07/2013   CREATININE 1.29 (H) 12/06/2016   CREATININE 0.77 10/07/2013   PROT 7.7 12/06/2016   PROT 8.3 (H) 11/12/2012   ALBUMIN 4.1 12/06/2016   ALBUMIN 4.2 11/12/2012   BILITOT 1.8 (H) 12/06/2016   BILITOT 1.7 (H) 11/12/2012   ALKPHOS 69 12/06/2016   ALKPHOS 83 11/12/2012   AST 26 12/06/2016   AST 34 11/12/2012   ALT 21 12/06/2016   ALT 57 11/12/2012  .  Micro Results No results found for this or any  previous visit (from the past 240 hour(s)).   Code Status History    Date Active Date Inactive Code Status Order ID Comments User Context   11/28/2016  1:42 PM 11/29/2016 10:59 PM Full Code [id number]  Demetrios Loll, MD Inpatient   10/28/2016  1:58 PM 10/30/2016  1:54 PM Full Code [id number]  Hillary Bow, MD ED   03/15/2016  7:58 AM 03/16/2016  3:08 PM Full Code [id number]  Harrie Foreman, MD Inpatient   02/16/2016  2:24 AM 02/17/2016  1:02 PM Full Code [id number]  Harrie Foreman, MD Inpatient   01/22/2016 11:51 PM 01/23/2016  4:41 PM Full Code [id number]  Demetrios Loll, MD ED   01/03/2016  2:22 PM 01/09/2016  2:54 PM Full Code [id number]  Lytle Butte, MD ED   05/17/2015 11:41 PM 05/19/2015  5:31 PM Full Code [id number]  Lance Coon, MD Inpatient   03/20/2015  8:26 AM 03/21/2015  3:27 PM Full Code [id number]  Harrie Foreman, MD Inpatient          Follow-up Information    Alisa Graff, FNP. Go on 12/04/2016.   Specialty:  Family Medicine Why:  at 9:20am to the [facility] Contact information: [place] [address] [address] [phone]           Discharge Medications   Allergies as of 11/29/2016      Reactions   Prednisone Other (See Comments)   Reaction: Hallucinations      Medication List    ASK your doctor about these medications   amiodarone 200 MG tablet Commonly known as:  PACERONE Take 1 tablet (200 mg total) by mouth 2 (two) times daily.   amLODipine 10 MG tablet Commonly known as:  NORVASC Take 1 tablet (10 mg total) by mouth daily.   aspirin 81 MG chewable tablet Chew 1 tablet (81 mg total) by mouth daily. Reported on 02/08/2016   lisinopril 20 MG tablet Commonly known as:  PRINIVIL,ZESTRIL Take 1 tablet (20 mg total) by mouth daily.   metoprolol succinate 25 MG 24 hr tablet Commonly known as:  TOPROL-XL Take 2 tablets (50 mg total) by mouth daily. Take with or immediately following a meal.   potassium chloride SA 20 MEQ  tablet Commonly known as:  K-DUR,KLOR-CON Take 0.5 tablets (10 mEq total) by mouth daily.          Total Time in preparing paper work, data evaluation and todays exam - 35 minutes  Dustin Flock M.D on 12/08/2016 at [facility] PM  [facility]   Office  [phone]

## 2016-12-11 ENCOUNTER — Encounter: Payer: Self-pay | Admitting: Family

## 2016-12-11 ENCOUNTER — Ambulatory Visit: Payer: Self-pay | Attending: Family | Admitting: Family

## 2016-12-11 VITALS — BP 170/96 | HR 86 | Resp 18 | Ht 72.0 in | Wt 253.0 lb

## 2016-12-11 DIAGNOSIS — R140 Abdominal distension (gaseous): Secondary | ICD-10-CM | POA: Insufficient documentation

## 2016-12-11 DIAGNOSIS — Z720 Tobacco use: Secondary | ICD-10-CM

## 2016-12-11 DIAGNOSIS — I130 Hypertensive heart and chronic kidney disease with heart failure and stage 1 through stage 4 chronic kidney disease, or unspecified chronic kidney disease: Secondary | ICD-10-CM | POA: Insufficient documentation

## 2016-12-11 DIAGNOSIS — E1122 Type 2 diabetes mellitus with diabetic chronic kidney disease: Secondary | ICD-10-CM | POA: Insufficient documentation

## 2016-12-11 DIAGNOSIS — I5022 Chronic systolic (congestive) heart failure: Secondary | ICD-10-CM | POA: Insufficient documentation

## 2016-12-11 DIAGNOSIS — J441 Chronic obstructive pulmonary disease with (acute) exacerbation: Secondary | ICD-10-CM | POA: Insufficient documentation

## 2016-12-11 DIAGNOSIS — I10 Essential (primary) hypertension: Secondary | ICD-10-CM

## 2016-12-11 DIAGNOSIS — N183 Chronic kidney disease, stage 3 (moderate): Secondary | ICD-10-CM | POA: Insufficient documentation

## 2016-12-11 DIAGNOSIS — Z7982 Long term (current) use of aspirin: Secondary | ICD-10-CM | POA: Insufficient documentation

## 2016-12-11 DIAGNOSIS — F17210 Nicotine dependence, cigarettes, uncomplicated: Secondary | ICD-10-CM | POA: Insufficient documentation

## 2016-12-11 DIAGNOSIS — I429 Cardiomyopathy, unspecified: Secondary | ICD-10-CM | POA: Insufficient documentation

## 2016-12-11 MED ORDER — SACUBITRIL-VALSARTAN 24-26 MG PO TABS: 1.0000 | ORAL_TABLET | Freq: Two times a day (BID) | ORAL | 3 refills | Status: DC

## 2016-12-11 NOTE — Progress Notes (Addendum)
Patient ID: [name]., male    DOB: 04-27-1971, 46 y.o.   MRN: [id number]  HPI  [name] is a 46 y/o male with a history of HTN, diabetes, COPD, CKD, asthma, current tobacco use and chronic heart failure.   Reviewed last echo report that was done 11/28/16 and it showed an EF of 40-45% along with mild MR. EF has improved from 30-35% July 2017.  Was in the ED 12/06/16 for a kidney stone. Evaluated and discharged home. Recently admitted 11/28/16 with HF exacerbation. Cardiology consult obtained. IV solu-medrol given for COPD exacerbation as well. Discharged home the next day. Admitted 10/28/16 with HF exacerbation. Initially treated with IV diuretics and transitioned to oral diuretics. Discharged home after 2 days. Has had numerous other admissions/ED visits in the last 6 months.  He presents today for a follow-up visit with a chief complaint of moderate shortness of breath with minimal exertion. Is relieved upon rest. Says that he's been having difficulty with his breathing for "years" and it's affecting his ability to hold down a job. He has associated fatigue, cough, chest pain, palpitations and abdominal distention although does feel like the abdominal distention is better.   Past Medical History:  Diagnosis Date  . Asthma   . Cardiomyopathy ([place])   . CHF (congestive heart failure) ([place])   . CKD (chronic kidney disease), stage III   . COPD (chronic obstructive pulmonary disease) ([place])   . Diabetes mellitus without complication ([place])    Pre diabetic  . Hypertension    Resolved since weight loss  . Obesity (BMI 30.0-34.9)   . Psoriasis   . Systolic CHF [facility])    Past Surgical History:  Procedure Laterality Date  . AMPUTATION    . FINGER AMPUTATION     Traumatic   Family History  Problem Relation Age of Onset  . Diabetes Mellitus II Mother   . Hypertension Father   . Cancer Paternal Aunt   . Cancer Maternal Grandfather    Social History  Substance Use Topics  . Smoking  status: Current Some Day Smoker    Packs/day: 0.00    Years: 33.00    Types: Cigarettes  . Smokeless tobacco: Never Used  . Alcohol use 1.8 oz/week    3 Cans of beer per week     Comment: occassionally   Allergies  Allergen Reactions  . Prednisone Other (See Comments)    Reaction: Hallucinations    Prior to Admission medications   Medication Sig Start Date End Date Taking? Authorizing Provider  amiodarone (PACERONE) 200 MG tablet Take 1 tablet (200 mg total) by mouth 2 (two) times daily. 09/19/16  Yes Earleen Newport, MD  amLODipine (NORVASC) 10 MG tablet Take 1 tablet (10 mg total) by mouth daily. 09/19/16  Yes Earleen Newport, MD  aspirin 81 MG chewable tablet Chew 1 tablet (81 mg total) by mouth daily. Reported on 02/08/2016 09/19/16  Yes Earleen Newport, MD  HYDROcodone-acetaminophen [facility]) 5-325 MG tablet Take 1 tablet by mouth every 6 (six) hours as needed for severe pain. 12/06/16  Yes Darel Hong, MD  ibuprofen (ADVIL,MOTRIN) 600 MG tablet Take 1 tablet (600 mg total) by mouth every 8 (eight) hours as needed. 12/06/16  Yes Darel Hong, MD  metoprolol succinate (TOPROL-XL) 25 MG 24 hr tablet Take 2 tablets (50 mg total) by mouth daily. Take with or immediately following a meal. 10/30/16  Yes Dustin Flock, MD  ondansetron (ZOFRAN ODT) 4 MG disintegrating tablet  Take 1 tablet (4 mg total) by mouth every 8 (eight) hours as needed for nausea or vomiting. 12/06/16  Yes Darel Hong, MD  potassium chloride SA (K-DUR,KLOR-CON) 20 MEQ tablet Take 1 tablets (20 mEq total) by mouth daily. 10/30/16  Yes Dustin Flock, MD  torsemide (DEMADEX) 20 MG tablet Take 2 tablets (40 mg total) by mouth daily. 12/04/16 01/03/17 Yes Alisa Graff, FNP  sacubitril-valsartan (ENTRESTO) 24-26 MG Take 1 tablet by mouth 2 (two) times daily. 12/11/16   Alisa Graff, FNP     Review of Systems  Constitutional: Positive for fatigue. Negative for appetite change.  HENT: Positive for congestion.  Negative for postnasal drip and sore throat.   Eyes: Negative.   Respiratory: Positive for cough and shortness of breath. Negative for chest tightness.   Cardiovascular: Negative for chest pain, palpitations and leg swelling.  Gastrointestinal: Positive for abdominal distention. Negative for abdominal pain.  Endocrine: Negative.   Genitourinary: Negative.   Musculoskeletal: Negative for back pain and neck pain.  Skin: Negative.   Allergic/Immunologic: Negative.   Neurological: Negative for dizziness and light-headedness.  Hematological: Negative for adenopathy. Does not bruise/bleed easily.  Psychiatric/Behavioral: Negative for dysphoric mood, sleep disturbance and suicidal ideas. The patient is nervous/anxious.    Vitals:   12/11/16 1607 12/11/16 1617  BP: (!) 175/105 (!) 170/96  Pulse: 86   Resp: 18   SpO2: 97%   Weight: 253 lb (114.8 kg)   Height: 6' (1.829 m)    Wt Readings from Last 3 Encounters:  12/11/16 253 lb (114.8 kg)  12/06/16 253 lb (114.8 kg)  12/04/16 255 lb 4 oz (115.8 kg)   Lab Results  Component Value Date   CREATININE 1.29 (H) 12/06/2016   CREATININE 1.41 (H) 11/29/2016   CREATININE 0.91 11/28/2016   Physical Exam  Constitutional: He is oriented to person, place, and time. He appears well-developed and well-nourished.  HENT:  Head: Normocephalic and atraumatic.  Neck: Normal range of motion. Neck supple. No JVD present.  Cardiovascular: Normal rate and regular rhythm.   Pulmonary/Chest: Effort normal. He has no wheezes. He has no rales.  Abdominal: Soft. He exhibits distension. There is no tenderness.  Musculoskeletal: He exhibits no edema or tenderness.  Neurological: He is alert and oriented to person, place, and time.  Skin: Skin is warm and dry.  Psychiatric: He has a normal mood and affect. His behavior is normal. Thought content normal.  Nursing note and vitals reviewed.   Assessment & Plan:  1: Chronic heart failure with reduced ejection  fraction- - NYHA class III - mildly fluid overloaded today - weigh daily and call for an overnight weight gain of >2 pounds or a weekly weight gain of >5 pounds - not adding salt and encouraged him to closely follow a 2000mg  sodium diet - tolerating his torsemide 40mg  daily except for occasional abdominal cramps - taking potassium 55meq daily - has not gotten any magnesium yet. Reviewed last Mg level on 11/28/16 which was 1.6 - finish out his lisinopril, skip one day and then begin entresto 24/26mg  twice daily. 30 day voucher given to patient - will also have him take 60mg  torsemide and 65meq potassium for the next 3 days as abdominal distention is slowly improving  2: HTN- - BP elevated today.  - BMP drawn on 12/06/16 reviewed; potassium 4.1 and GFR >60 - will not repeat BMP today - adjusting meds per above - says that he's waiting on [facility] to return his  call  3: Tobacco use- - continues to smoke 1/2 ppd - complete cessation discussed for 3 minutes with him   Patient did not bring his medications nor a list. Each medication was verbally reviewed with the patient and he was encouraged to bring the bottles to every visit to confirm accuracy of list.  Return in 1 week or sooner for any questions/problems before then. Check BMP next week as well as after being on entresto for a few weeks. Patient says that he'll have to call back tomorrow to schedule the appointment as time depends on whether he's working or not.

## 2016-12-11 NOTE — Patient Instructions (Addendum)
Continue weighing daily and call for an overnight weight gain of > 2 pounds or a weekly weight gain of >5 pounds.  Finish out the lisinopril, skip one day and then begin entresto 24/26mg  twice daily.  For the next 3 days, take 3 tablets of torsemide along with with 2 potassium tablets.   Call back tomorrow to get appointment scheduled with [place].

## 2016-12-12 ENCOUNTER — Telehealth: Payer: Self-pay

## 2016-12-17 DIAGNOSIS — I214 Non-ST elevation (NSTEMI) myocardial infarction: Secondary | ICD-10-CM

## 2016-12-17 HISTORY — DX: Non-ST elevation (NSTEMI) myocardial infarction: I21.4

## 2016-12-18 ENCOUNTER — Ambulatory Visit: Payer: Self-pay | Admitting: Family

## 2016-12-18 NOTE — Telephone Encounter (Signed)
Appt made

## 2016-12-24 ENCOUNTER — Telehealth: Payer: Self-pay | Admitting: Cardiovascular Disease

## 2016-12-24 NOTE — Telephone Encounter (Signed)
3 attempts to schedule fu from recall list  6 month fu per checkout 03/08/2016   Lm with father to call office Deleting recall.

## 2017-01-05 ENCOUNTER — Emergency Department: Payer: Self-pay

## 2017-01-05 ENCOUNTER — Observation Stay
Admission: EM | Admit: 2017-01-05 | Discharge: 2017-01-07 | Disposition: A | Discharge status: Home / Self Care | Payer: Self-pay | Attending: Specialist | Admitting: Specialist

## 2017-01-05 DIAGNOSIS — L409 Psoriasis, unspecified: Secondary | ICD-10-CM | POA: Insufficient documentation

## 2017-01-05 DIAGNOSIS — E785 Hyperlipidemia, unspecified: Secondary | ICD-10-CM | POA: Insufficient documentation

## 2017-01-05 DIAGNOSIS — I25110 Atherosclerotic heart disease of native coronary artery with unstable angina pectoris: Principal | ICD-10-CM | POA: Insufficient documentation

## 2017-01-05 DIAGNOSIS — I5022 Chronic systolic (congestive) heart failure: Secondary | ICD-10-CM

## 2017-01-05 DIAGNOSIS — Z720 Tobacco use: Secondary | ICD-10-CM

## 2017-01-05 DIAGNOSIS — F419 Anxiety disorder, unspecified: Secondary | ICD-10-CM | POA: Insufficient documentation

## 2017-01-05 DIAGNOSIS — I5023 Acute on chronic systolic (congestive) heart failure: Secondary | ICD-10-CM | POA: Insufficient documentation

## 2017-01-05 DIAGNOSIS — J449 Chronic obstructive pulmonary disease, unspecified: Secondary | ICD-10-CM | POA: Insufficient documentation

## 2017-01-05 DIAGNOSIS — Z9119 Patient's noncompliance with other medical treatment and regimen: Secondary | ICD-10-CM | POA: Insufficient documentation

## 2017-01-05 DIAGNOSIS — I428 Other cardiomyopathies: Secondary | ICD-10-CM | POA: Insufficient documentation

## 2017-01-05 DIAGNOSIS — E669 Obesity, unspecified: Secondary | ICD-10-CM | POA: Insufficient documentation

## 2017-01-05 DIAGNOSIS — E1122 Type 2 diabetes mellitus with diabetic chronic kidney disease: Secondary | ICD-10-CM | POA: Insufficient documentation

## 2017-01-05 DIAGNOSIS — Z6834 Body mass index (BMI) 34.0-34.9, adult: Secondary | ICD-10-CM | POA: Insufficient documentation

## 2017-01-05 DIAGNOSIS — I130 Hypertensive heart and chronic kidney disease with heart failure and stage 1 through stage 4 chronic kidney disease, or unspecified chronic kidney disease: Secondary | ICD-10-CM | POA: Insufficient documentation

## 2017-01-05 DIAGNOSIS — I110 Hypertensive heart disease with heart failure: Secondary | ICD-10-CM

## 2017-01-05 DIAGNOSIS — Z7982 Long term (current) use of aspirin: Secondary | ICD-10-CM | POA: Insufficient documentation

## 2017-01-05 DIAGNOSIS — N183 Chronic kidney disease, stage 3 (moderate): Secondary | ICD-10-CM | POA: Insufficient documentation

## 2017-01-05 DIAGNOSIS — F17210 Nicotine dependence, cigarettes, uncomplicated: Secondary | ICD-10-CM | POA: Insufficient documentation

## 2017-01-05 DIAGNOSIS — R0600 Dyspnea, unspecified: Secondary | ICD-10-CM

## 2017-01-05 DIAGNOSIS — I472 Ventricular tachycardia: Secondary | ICD-10-CM | POA: Insufficient documentation

## 2017-01-05 DIAGNOSIS — I214 Non-ST elevation (NSTEMI) myocardial infarction: Secondary | ICD-10-CM

## 2017-01-05 HISTORY — DX: Tobacco use: Z72.0

## 2017-01-05 LAB — GLUCOSE, CAPILLARY
Glucose-Capillary: 99 mg/dL (ref 65–99)
Glucose-Capillary: 92 mg/dL (ref 65–99)

## 2017-01-05 LAB — CBC
HCT: 44.2 % (ref 40.0–52.0)
Hemoglobin: 15 g/dL (ref 13.0–18.0)
MCH: 27.3 pg (ref 26.0–34.0)
MCHC: 34 g/dL (ref 32.0–36.0)
MCV: 80.3 fL (ref 80.0–100.0)
Platelets: 163 10*3/uL (ref 150–440)
RBC: 5.5 MIL/uL (ref 4.40–5.90)
RDW: 17.2 % — ABNORMAL HIGH (ref 11.5–14.5)
WBC: 9.4 10*3/uL (ref 3.8–10.6)

## 2017-01-05 LAB — COMPREHENSIVE METABOLIC PANEL
ALT: 29 U/L (ref 17–63)
AST: 32 U/L (ref 15–41)
Albumin: 3.6 g/dL (ref 3.5–5.0)
Alkaline Phosphatase: 74 U/L (ref 38–126)
Anion gap: 7 (ref 5–15)
BUN: 18 mg/dL (ref 6–20)
CO2: 26 mmol/L (ref 22–32)
Calcium: 8.7 mg/dL — ABNORMAL LOW (ref 8.9–10.3)
Chloride: 107 mmol/L (ref 101–111)
Creatinine, Ser: 1.2 mg/dL (ref 0.61–1.24)
GFR calc Af Amer: >60 mL/min (ref >60)
GFR calc non Af Amer: >60 mL/min (ref >60)
Glucose, Bld: 142 mg/dL — ABNORMAL HIGH (ref 65–99)
Potassium: 3.6 mmol/L (ref 3.5–5.1)
Sodium: 140 mmol/L (ref 135–145)
Total Bilirubin: 2 mg/dL — ABNORMAL HIGH (ref 0.3–1.2)
Total Protein: 6.7 g/dL (ref 6.5–8.1)

## 2017-01-05 LAB — TROPONIN I
Troponin I: 0.07 ng/mL (ref <0.03)
Troponin I: 0.06 ng/mL (ref <0.03)
Troponin I: 0.06 ng/mL (ref <0.03)
Troponin I: 0.06 ng/mL (ref <0.03)

## 2017-01-05 LAB — APTT: aPTT: 29 seconds (ref 24–36)

## 2017-01-05 LAB — PROTIME-INR
INR: 1.11
Prothrombin Time: 14.4 seconds (ref 11.4–15.2)

## 2017-01-05 LAB — HEPARIN LEVEL (UNFRACTIONATED): Heparin Unfractionated: 0.12 IU/mL — ABNORMAL LOW (ref 0.30–0.70)

## 2017-01-05 MED ORDER — ONDANSETRON 4 MG PO TBDP
4.0000 mg | ORAL_TABLET | Freq: Three times a day (TID) | ORAL | Status: DC | PRN
  Filled 2017-01-05: qty 1

## 2017-01-05 MED ORDER — MORPHINE SULFATE (PF) 2 MG/ML IV SOLN
INTRAVENOUS | Status: AC
  Administered 2017-01-05: 2 mg via INTRAVENOUS
  Filled 2017-01-05: qty 1

## 2017-01-05 MED ORDER — NITROGLYCERIN 0.4 MG SL SUBL
0.4000 mg | SUBLINGUAL_TABLET | SUBLINGUAL | Status: DC | PRN
  Administered 2017-01-05: 0.4 mg via SUBLINGUAL

## 2017-01-05 MED ORDER — INSULIN ASPART 100 UNIT/ML ~~LOC~~ SOLN
0.0000 [IU] | Freq: Three times a day (TID) | SUBCUTANEOUS | Status: DC
  Administered 2017-01-07: 1 [IU] via SUBCUTANEOUS
  Filled 2017-01-05: qty 1

## 2017-01-05 MED ORDER — ROSUVASTATIN CALCIUM 20 MG PO TABS
20.0000 mg | ORAL_TABLET | Freq: Every day | ORAL | Status: DC
  Administered 2017-01-05 – 2017-01-06 (×2): 20 mg via ORAL
  Filled 2017-01-05 (×2): qty 1

## 2017-01-05 MED ORDER — IOPAMIDOL (ISOVUE-370) INJECTION 76%
75.0000 mL | Freq: Once | INTRAVENOUS | Status: AC | PRN
  Administered 2017-01-05: 75 mL via INTRAVENOUS

## 2017-01-05 MED ORDER — AMIODARONE HCL 200 MG PO TABS: 200.0000 mg | ORAL_TABLET | Freq: Two times a day (BID) | ORAL | Status: DC

## 2017-01-05 MED ORDER — HEPARIN (PORCINE) IN NACL 100-0.45 UNIT/ML-% IJ SOLN
1850.0000 [IU]/h | INTRAMUSCULAR | Status: DC
  Administered 2017-01-05: 1650 [IU]/h via INTRAVENOUS
  Filled 2017-01-05: qty 250

## 2017-01-05 MED ORDER — METOPROLOL SUCCINATE ER 50 MG PO TB24
50.0000 mg | ORAL_TABLET | Freq: Every day | ORAL | Status: DC
  Administered 2017-01-06 – 2017-01-07 (×2): 50 mg via ORAL
  Filled 2017-01-05 (×2): qty 1

## 2017-01-05 MED ORDER — ASPIRIN 81 MG PO CHEW
81.0000 mg | CHEWABLE_TABLET | Freq: Every day | ORAL | Status: DC
  Administered 2017-01-06 – 2017-01-07 (×2): 81 mg via ORAL
  Filled 2017-01-05 (×2): qty 1

## 2017-01-05 MED ORDER — AMLODIPINE BESYLATE 10 MG PO TABS: 10.0000 mg | ORAL_TABLET | Freq: Every day | ORAL | Status: DC

## 2017-01-05 MED ORDER — ONDANSETRON HCL 4 MG/2ML IJ SOLN: 4.0000 mg | Freq: Four times a day (QID) | INTRAMUSCULAR | Status: DC | PRN

## 2017-01-05 MED ORDER — POTASSIUM CHLORIDE CRYS ER 10 MEQ PO TBCR
10.0000 meq | EXTENDED_RELEASE_TABLET | Freq: Every day | ORAL | Status: DC
  Administered 2017-01-06 – 2017-01-07 (×2): 10 meq via ORAL
  Filled 2017-01-05 (×2): qty 1

## 2017-01-05 MED ORDER — MORPHINE SULFATE (PF) 2 MG/ML IV SOLN
2.0000 mg | Freq: Once | INTRAVENOUS | Status: AC
  Administered 2017-01-05: 2 mg via INTRAVENOUS
  Filled 2017-01-05: qty 1

## 2017-01-05 MED ORDER — INSULIN ASPART 100 UNIT/ML ~~LOC~~ SOLN: 0.0000 [IU] | Freq: Every day | SUBCUTANEOUS | Status: DC

## 2017-01-05 MED ORDER — ACETAMINOPHEN 325 MG PO TABS
650.0000 mg | ORAL_TABLET | ORAL | Status: DC | PRN
  Administered 2017-01-06: 650 mg via ORAL
  Filled 2017-01-05: qty 2

## 2017-01-05 MED ORDER — NITROGLYCERIN 0.4 MG SL SUBL
SUBLINGUAL_TABLET | SUBLINGUAL | Status: AC
  Administered 2017-01-05: 0.4 mg via SUBLINGUAL
  Filled 2017-01-05: qty 1

## 2017-01-05 MED ORDER — PROMETHAZINE HCL 25 MG/ML IJ SOLN
12.5000 mg | Freq: Once | INTRAMUSCULAR | Status: AC
  Administered 2017-01-05: 12.5 mg via INTRAVENOUS
  Filled 2017-01-05: qty 1

## 2017-01-05 MED ORDER — TORSEMIDE 20 MG PO TABS: 40.0000 mg | ORAL_TABLET | Freq: Every day | ORAL | Status: DC

## 2017-01-05 MED ORDER — HEPARIN (PORCINE) IN NACL 100-0.45 UNIT/ML-% IJ SOLN: 10.0000 [IU]/kg/h | Freq: Once | INTRAMUSCULAR | Status: DC

## 2017-01-05 MED ORDER — HEPARIN BOLUS VIA INFUSION
3000.0000 [IU] | Freq: Once | INTRAVENOUS | Status: AC
  Administered 2017-01-05: 3000 [IU] via INTRAVENOUS
  Filled 2017-01-05: qty 3000

## 2017-01-05 MED ORDER — SACUBITRIL-VALSARTAN 24-26 MG PO TABS
1.0000 | ORAL_TABLET | Freq: Two times a day (BID) | ORAL | Status: DC
  Administered 2017-01-05: 1 via ORAL
  Filled 2017-01-05: qty 1

## 2017-01-05 MED ORDER — CLOPIDOGREL BISULFATE 75 MG PO TABS
300.0000 mg | ORAL_TABLET | Freq: Once | ORAL | Status: AC
  Administered 2017-01-05: 300 mg via ORAL
  Filled 2017-01-05: qty 4

## 2017-01-05 MED ORDER — MORPHINE SULFATE (PF) 2 MG/ML IV SOLN
2.0000 mg | Freq: Once | INTRAVENOUS | Status: AC
  Administered 2017-01-05: 2 mg via INTRAVENOUS

## 2017-01-05 MED ORDER — MORPHINE SULFATE (PF) 2 MG/ML IV SOLN
2.0000 mg | INTRAVENOUS | Status: DC | PRN
  Administered 2017-01-05 (×2): 2 mg via INTRAVENOUS
  Filled 2017-01-05 (×2): qty 1

## 2017-01-05 MED ORDER — ASPIRIN 81 MG PO CHEW
324.0000 mg | CHEWABLE_TABLET | Freq: Once | ORAL | Status: AC
  Administered 2017-01-05: 324 mg via ORAL
  Filled 2017-01-05: qty 4

## 2017-01-05 MED ORDER — NITROGLYCERIN 2 % TD OINT
1.0000 [in_us] | TOPICAL_OINTMENT | Freq: Once | TRANSDERMAL | Status: AC
  Administered 2017-01-05: 1 [in_us] via TOPICAL
  Filled 2017-01-05: qty 1

## 2017-01-05 MED ORDER — HEPARIN (PORCINE) IN NACL 100-0.45 UNIT/ML-% IJ SOLN
1350.0000 [IU]/h | INTRAMUSCULAR | Status: DC
  Administered 2017-01-05: 1350 [IU]/h via INTRAVENOUS
  Filled 2017-01-05 (×2): qty 250

## 2017-01-05 MED ORDER — HEPARIN SODIUM (PORCINE) 5000 UNIT/ML IJ SOLN
4000.0000 [IU] | Freq: Once | INTRAMUSCULAR | Status: AC
  Administered 2017-01-05: 4000 [IU] via INTRAVENOUS

## 2017-01-05 NOTE — ED Notes (Signed)
Pt with shortness of breath and upper abdomen pain post CT. MD at bedside for additional orders. No EKG changes at present time

## 2017-01-05 NOTE — ED Notes (Signed)
Pt in room talking on phone with family. States pain has "eased up".

## 2017-01-05 NOTE — ED Triage Notes (Signed)
Pt came to Ed via EMS c/o sob x2days. Reports today started having sharp chest pains in center of chest. Pt has history of copd, asthma, chf. VS stable at this time.

## 2017-01-05 NOTE — Consult Note (Addendum)
CARDIOLOGY CONSULT NOTE     Primary Care Physician: Patient, No Pcp Per Referring Physician:  Dr Margaretmary Eddy  Admit Date: 01/05/2017  Reason for consultation:  Chest pain  Gabriel [name]. is a 46 y.o. male with a h/o chronic systolic dysfunction (EF 57%), medical noncompliance, renal failure, COPD, and ongoing tobacco use.  He is admitted with chest pain. Cardiology has been consulted for further evaluation and management of his chest pain. The patient reports having severe chest heaviness yesterday and today.  He is unaware of triggers/ precipitants.  He also reports mild to moderate SOB.  In the ED, he was noted to have dynamic lateral ekg changes.  He is therefore admitted for further evaluation. He has had multiple prior admissions for CHF and also for chest pain.  He is followed by the CHF clinic.  I do not see that he has had a prior cath.  He is also unaware but has had prior stress testing.  Today, he denies symptoms of palpitations,  orthopnea, PND, lower extremity edema, dizziness, presyncope, syncope, or neurologic sequela. The patient is tolerating medications without difficulties and is otherwise without complaint today.   Past Medical History:  Diagnosis Date  . Cardiomyopathy ([place])   . Chronic systolic dysfunction of left ventricle    presumed nonischemic, though patient denies having prior cath  . CKD (chronic kidney disease), stage III   . COPD (chronic obstructive pulmonary disease) ([place])   . Hypertension    Resolved since weight loss  . Obesity (BMI 30.0-34.9)   . Psoriasis   . Tobacco abuse    Past Surgical History:  Procedure Laterality Date  . AMPUTATION    . FINGER AMPUTATION     Traumatic    . amiodarone  200 mg Oral BID  . [START ON 01/06/2017] amLODipine  10 mg Oral Daily  . [START ON 01/06/2017] aspirin  81 mg Oral Daily  . clopidogrel  300 mg Oral Once  . insulin aspart  0-5 Units Subcutaneous QHS  . insulin aspart  0-9 Units Subcutaneous TID WC  .  [START ON 01/06/2017] metoprolol succinate  50 mg Oral Daily  . [START ON 01/06/2017] potassium chloride SA  10 mEq Oral Daily  . sacubitril-valsartan  1 tablet Oral BID  . [START ON 01/06/2017] torsemide  40 mg Oral Daily   . heparin 1,350 Units/hr (01/05/17 1405)    Allergies  Allergen Reactions  . Prednisone Other (See Comments)    Reaction: Hallucinations     Social History   Social History  . Marital status: Widowed    Spouse name: N/A  . Number of children: N/A  . Years of education: N/A   Occupational History  . Not on file.   Social History Main Topics  . Smoking status: Current Some Day Smoker    Packs/day: 0.00    Years: 33.00    Types: Cigarettes  . Smokeless tobacco: Never Used  . Alcohol use 1.8 oz/week    3 Cans of beer per week     Comment: occassionally  . Drug use: No  . Sexual activity: Not on file   Other Topics Concern  . Not on file   Social History Narrative  . No narrative on file    Family History  Problem Relation Age of Onset  . Diabetes Mellitus II Mother   . Hypertension Father   . Cancer Paternal Aunt   . Cancer Maternal Grandfather     ROS- All systems are  reviewed and negative except as per the HPI above  Physical Exam: Telemetry:  Sinus rhythm Vitals:   01/05/17 1500 01/05/17 1515 01/05/17 1530 01/05/17 1637  BP: (!) 146/102  (!) 142/101 (!) 146/94  Pulse: 77 77 66 64  Resp: (!) 22 (!) 22 20 16   Temp:    98.2 F (36.8 C)  TempSrc:    Oral  SpO2: 94% 92% 92% 95%  Weight:      Height:       GEN- The patient is well appearing, alert and oriented x 3 today.  He has his mouth full of [place] sandwich and has difficulty providing history because he is eating.  He does not appear uncomfortable. Head- normocephalic, atraumatic Eyes-  Sclera clear, conjunctiva pink Ears- hearing intact Oropharynx- clear Neck- supple,   Lungs- Clear to ausculation bilaterally, normal work of breathing Heart- Regular rate and rhythm  GI-  soft, NT, ND, + BS Extremities- no clubbing, cyanosis, + dependant edema MS- no significant deformity or atrophy Skin- no rash or lesion Psych- euthymic mood, full affect Neuro- strength and sensation are intact  EKG tracings from today are reviewed and compared to prior ekgs,  Sinus rhythm with dynamic lateral ST changes  Labs:   Lab Results  Component Value Date   WBC 9.4 01/05/2017   HGB 15.0 01/05/2017   HCT 44.2 01/05/2017   MCV 80.3 01/05/2017   PLT 163 01/05/2017    Recent Labs Lab 01/05/17 1118  NA 140  K 3.6  CL 107  CO2 26  BUN 18  CREATININE 1.20  CALCIUM 8.7*  PROT 6.7  BILITOT 2.0*  ALKPHOS 74  ALT 29  AST 32  GLUCOSE 142*   Lab Results  Component Value Date   CKTOTAL 53 08/26/2012   CKMB < 0.5 (L) 08/26/2012   TROPONINI 0.06 (HH) 01/05/2017    Lab Results  Component Value Date   CHOL 74 01/04/2016   CHOL 149 03/21/2015   Lab Results  Component Value Date   HDL 14 (L) 01/04/2016   HDL 22 (L) 03/21/2015   Lab Results  Component Value Date   LDLCALC 46 01/04/2016   LDLCALC 93 03/21/2015   Lab Results  Component Value Date   TRIG 71 01/04/2016   TRIG 170 (H) 03/21/2015   Lab Results  Component Value Date   CHOLHDL 5.3 01/04/2016   CHOLHDL 6.8 03/21/2015   No results found for: LDLDIRECT     Radiology: Cardiac CT is reviewed CXR is reviewed  Echo from 11/28/16 is reviewed  CHF clinic notes are reviewed  ASSESSMENT AND PLAN:   1. Acute coronary syndrome The patient is admitted with chest pain and ekg changes worrisome for unstable angina.  He has been admitted and is now on IV heparin.  Will follow closely while here. I would advise cath at this time.  He has chronically depressed EF and coronary calcifications.  He does not recall prior cath. Discussed the risks of cath with the patient.  Will make NPO for cath in am. Add high dose statin  2. Chronic systolic dysfunction Not overtly overloaded currently Lifestyle  modification encouraged  3. NSVT He has previously been on amiodarone for NSVT. Given his young age, I would advise that amiodarone be discontinued.  Titrate beta blocker as able.  Further considerations pending results of cath.  Consider consulting Dr Caryl Comes (EP) if further NSVT off of amiodarone  4. Hypertensive cardiovascular disease Medicine optimization while here Compliance will be essential  for him to do well long term.  5. Ongoing tobacco use Cessation strongly advised  6. Obesity Body mass index is 34.31 kg/m.  Weight loss is essential for him to do well long term.  Very complicated patient with care limited by compliance.  He is at risk for decompensation.  A high level of decision making was required for this encounter.  Cardiology to follow closely while here.  Thompson Grayer MD, [facility] 01/05/2017 5:10 PM

## 2017-01-05 NOTE — Progress Notes (Signed)
ANTICOAGULATION CONSULT NOTE - Initial Consult  Pharmacy Consult for heparin Indication: chest pain/ACS  Allergies  Allergen Reactions  . Prednisone Other (See Comments)    Reaction: Hallucinations     Patient Measurements: Height: 6' (182.9 cm) Weight: 256 lb 12.8 oz (116.5 kg) IBW/kg (Calculated) : 77.6 Heparin Dosing Weight: 102.3 kg   Vital Signs: Temp: 98.2 F (36.8 C) (05/20 1637) Temp Source: Oral (05/20 1637) BP: 146/94 (05/20 1637) Pulse Rate: 64 (05/20 1637)  Labs:  Recent Labs  01/05/17 1118 01/05/17 1356 01/05/17 1605 01/05/17 1943  HGB 15.0  --   --   --   HCT 44.2  --   --   --   PLT 163  --   --   --   APTT  --  29  --   --   LABPROT  --  14.4  --   --   INR  --  1.11  --   --   HEPARINUNFRC  --   --   --  0.12*  CREATININE 1.20  --   --   --   TROPONINI 0.07*  --  0.06* 0.06*    Estimated Creatinine Clearance: 100.3 mL/min (by C-G formula based on SCr of 1.2 mg/dL).   Medical History: Past Medical History:  Diagnosis Date  . Cardiomyopathy ([place])   . Chronic systolic dysfunction of left ventricle    presumed nonischemic, though patient denies having prior cath  . CKD (chronic kidney disease), stage III   . COPD (chronic obstructive pulmonary disease) ([place])   . Hypertension    Resolved since weight loss  . Obesity (BMI 30.0-34.9)   . Psoriasis   . Tobacco abuse    Assessment: 46 yo male who presents to the ED with chest pain and shortness of breath. Pharmacy consulted for heparin drip. Patient not on any prior anticoagulation at home.   Baseline labs ordered.   Goal of Therapy:  Heparin level 0.3-0.7 units/ml Monitor platelets by anticoagulation protocol: Yes   Plan:  5/20 2000 HL suptherapeutic at 0.12. Will order Heparin bolus of 3000 untis and increase infusion to 1650u/hr. Will recheck heparin level in 6 hours. CBC with AM labs per protocol.   Pernell Dupre, PharmD, BCPS Clinical Pharmacist 01/05/2017 8:32 PM

## 2017-01-05 NOTE — Progress Notes (Signed)
ANTICOAGULATION CONSULT NOTE - Initial Consult  Pharmacy Consult for heparin Indication: chest pain/ACS  Allergies  Allergen Reactions  . Prednisone Other (See Comments)    Reaction: Hallucinations     Patient Measurements: Height: 6' (182.9 cm) Weight: 253 lb (114.8 kg) IBW/kg (Calculated) : 77.6 Heparin Dosing Weight: 102.3 kg   Vital Signs: Temp: 97.8 F (36.6 C) (05/20 1116) Temp Source: Oral (05/20 1116) BP: 157/108 (05/20 1116) Pulse Rate: 80 (05/20 1116)  Labs:  Recent Labs  01/05/17 1118  HGB 15.0  HCT 44.2  PLT 163  CREATININE 1.20  TROPONINI 0.07*    Estimated Creatinine Clearance: 99.6 mL/min (by C-G formula based on SCr of 1.2 mg/dL).   Medical History: Past Medical History:  Diagnosis Date  . Asthma   . Cardiomyopathy ([place])   . CHF (congestive heart failure) ([place])   . CKD (chronic kidney disease), stage III   . COPD (chronic obstructive pulmonary disease) ([place])   . Diabetes mellitus without complication ([place])    Pre diabetic  . Hypertension    Resolved since weight loss  . Obesity (BMI 30.0-34.9)   . Psoriasis   . Systolic CHF [facility])    Assessment: 46 yo male who presents to the ED with chest pain and shortness of breath. Pharmacy consulted for heparin drip. Patient not on any prior anticoagulation at home.   Baseline labs ordered.   Goal of Therapy:  Heparin level 0.3-0.7 units/ml Monitor platelets by anticoagulation protocol: Yes   Plan:  Give 4000 units bolus x 1 Start heparin infusion at 1350 units/hr Check anti-Xa level in 6 hours and daily while on heparin Continue to monitor H&H and platelets  Loree Fee, PharmD 01/05/2017,1:44 PM

## 2017-01-05 NOTE — ED Provider Notes (Addendum)
[facility] Emergency Department Provider Note   ____________________________________________   First MD Initiated Contact with Patient 01/05/17 1328     (approximate)  I have reviewed the triage vital signs and the nursing notes.   HISTORY  Chief Complaint Chest Pain and Shortness of Breath   HPI Gabriel [name]. is a 46 y.o. male with a past history of diabetes type 2 which improved with weight loss and smoking. Patient reports increasing shortness of breath with exertion for some weeks and then chest pain coming and going for the last 2 days. It began again at 9:00 this morning with shortness of breath and nausea and has continued. It feels like an elephant sitting on his chest.   Past Medical History:  Diagnosis Date  . Asthma   . Cardiomyopathy ([place])   . CHF (congestive heart failure) ([place])   . CKD (chronic kidney disease), stage III   . COPD (chronic obstructive pulmonary disease) ([place])   . Diabetes mellitus without complication ([place])    Pre diabetic  . Hypertension    Resolved since weight loss  . Obesity (BMI 30.0-34.9)   . Psoriasis   . Systolic CHF [facility])     Patient Active Problem List   Diagnosis Date Noted  . Tobacco use 12/04/2016  . Gallbladder sludge 12/04/2016  . Acute on chronic systolic CHF (congestive heart failure) ([place]) 11/28/2016  . HCAP (healthcare-associated pneumonia) 03/15/2016  . Coronary artery disease, non-occlusive 03/15/2016  . Chest pain 02/16/2016  . Orthostatic hypotension 01/23/2016  . Diarrhea 01/23/2016  . Hypokalemia 01/23/2016  . Hypomagnesemia 01/23/2016  . Syncope 01/22/2016  . 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 NSVT (nonsustained ventricular tachycardia) ([place])   . 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 CAP (community acquired pneumonia)   . Arterial hypotension   . Elevated troponin   . Chronic systolic heart failure ([place])   . Sepsis ([place]) 01/03/2016  . Type II  diabetes mellitus ([place]) 05/17/2015  . Hyperlipidemia 05/17/2015  . COPD (chronic obstructive pulmonary disease) ([place]) 05/17/2015  . HTN (hypertension) 03/31/2015    Past Surgical History:  Procedure Laterality Date  . AMPUTATION    . FINGER AMPUTATION     Traumatic    Prior to Admission medications   Medication Sig Start Date End Date Taking? Authorizing Provider  amiodarone (PACERONE) 200 MG tablet Take 1 tablet (200 mg total) by mouth 2 (two) times daily. 09/19/16   Earleen Newport, MD  amLODipine (NORVASC) 10 MG tablet Take 1 tablet (10 mg total) by mouth daily. 09/19/16   Earleen Newport, MD  aspirin 81 MG chewable tablet Chew 1 tablet (81 mg total) by mouth daily. Reported on 02/08/2016 09/19/16   Earleen Newport, MD  HYDROcodone-acetaminophen [facility]) 5-325 MG tablet Take 1 tablet by mouth every 6 (six) hours as needed for severe pain. 12/06/16   Darel Hong, MD  ibuprofen (ADVIL,MOTRIN) 600 MG tablet Take 1 tablet (600 mg total) by mouth every 8 (eight) hours as needed. 12/06/16   Darel Hong, MD  metoprolol succinate (TOPROL-XL) 25 MG 24 hr tablet Take 2 tablets (50 mg total) by mouth daily. Take with or immediately following a meal. 10/30/16   Dustin Flock, MD  ondansetron (ZOFRAN ODT) 4 MG disintegrating tablet Take 1 tablet (4 mg total) by mouth every 8 (eight) hours as needed for nausea or vomiting. 12/06/16   Darel Hong, MD  potassium chloride SA (K-DUR,KLOR-CON) 20 MEQ tablet Take 0.5 tablets (  10 mEq total) by mouth daily. Patient taking differently: Take 20 mEq by mouth daily.  10/30/16   Dustin Flock, MD  sacubitril-valsartan (ENTRESTO) 24-26 MG Take 1 tablet by mouth 2 (two) times daily. 12/11/16   Alisa Graff, FNP  torsemide (DEMADEX) 20 MG tablet Take 2 tablets (40 mg total) by mouth daily. 12/04/16 01/03/17  Alisa Graff, FNP    Allergies Prednisone  Family History  Problem Relation Age of Onset  . Diabetes Mellitus II Mother   .  Hypertension Father   . Cancer Paternal Aunt   . Cancer Maternal Grandfather     Social History Social History  Substance Use Topics  . Smoking status: Current Some Day Smoker    Packs/day: 0.00    Years: 33.00    Types: Cigarettes  . Smokeless tobacco: Never Used  . Alcohol use 1.8 oz/week    3 Cans of beer per week     Comment: occassionally    Review of Systems  Constitutional: No fever/chills Eyes: No visual changes. ENT: No sore throat. Cardiovascular: chest pain. Respiratory:  shortness of breath. Gastrointestinal: No abdominal pain.  No nausea, no vomiting.  No diarrhea.  No constipation. Genitourinary: Negative for dysuria. Musculoskeletal: Negative for back pain. Skin: Negative for rash. Neurological: Negative for headaches, focal weakness or numbness.   ____________________________________________   PHYSICAL EXAM:  VITAL SIGNS: ED Triage Vitals  Enc Vitals Group     BP 01/05/17 1116 (!) 157/108     Pulse Rate 01/05/17 1116 80     Resp 01/05/17 1116 16     Temp 01/05/17 1116 97.8 F (36.6 C)     Temp Source 01/05/17 1116 Oral     SpO2 01/05/17 1116 96 %     Weight 01/05/17 1117 253 lb (114.8 kg)     Height 01/05/17 1117 6' (1.829 m)     Head Circumference --      Peak Flow --      Pain Score --      Pain Loc --      Pain Edu? --      Excl. in [place]? --     Constitutional: Alert and oriented. Well appearing and in no acute distress. Eyes: Conjunctivae are normal. PERRL. EOMI. Head: Atraumatic. Nose: No congestion/rhinnorhea. Mouth/Throat: Mucous membranes are moist.  Oropharynx non-erythematous. Neck: No stridor.   Cardiovascular: Normal rate, regular rhythm. Grossly normal heart sounds.  Good peripheral circulation. Respiratory: Normal respiratory effort.  No retractions. Lungs CTAB. Gastrointestinal: Soft and nontender. No distention. No abdominal bruits. No CVA tenderness. Musculoskeletal: No lower extremity tenderness nor edema.  No joint  effusions. Neurologic:  Normal speech and language. No gross focal neurologic deficits are appreciated. No gait instability. Skin:  Skin is warm, dry and intact. No rash noted. Psychiatric: Mood and affect are normal. Speech and behavior are normal.  ____________________________________________   LABS (all labs ordered are listed, but only abnormal results are displayed)  Labs Reviewed  CBC - Abnormal; Notable for the following:       Result Value   RDW 17.2 (*)    All other components within normal limits  TROPONIN I - Abnormal; Notable for the following:    Troponin I 0.07 (*)    All other components within normal limits  COMPREHENSIVE METABOLIC PANEL - Abnormal; Notable for the following:    Glucose, Bld 142 (*)    Calcium 8.7 (*)    Total Bilirubin 2.0 (*)    All other  components within normal limits  PROTIME-INR  APTT   ____________________________________________  EKG  EKG read and interpreted by me shows normal sinus rhythm rate of 74 rightward axis no acute ST-T wave changes ____________________________________________  RADIOLOGY Study Result   CLINICAL DATA:  Pt came to Ed via EMS c/o sob x2days. Reports today started having sharp chest pains in center of chest. Pt has history of copd, asthma, chf. VS stable at this time. Smoker.  EXAM: CHEST  2 VIEW  COMPARISON:  11/28/2016  FINDINGS: Cardiac silhouette is is mildly enlarged. No mediastinal hilar masses. No evidence of adenopathy.  There is irregular thickening of the interstitial markings bilaterally, similar to the prior study. No lung consolidation. No pleural effusion or pneumothorax.  Skeletal structures are intact.  IMPRESSION: 1. No convincing acute cardiopulmonary disease. 2. There is irregular interstitial thickening which has been stable from multiple prior studies, presumed chronic. A component of interstitial edema is possible. No evidence of pneumonia.   Electronically  Signed   By: Lajean Manes M.D.     ____________________________________________   PROCEDURES  Procedure(s) performed:  Procedures  Critical Care performed:   ____________________________________________   INITIAL IMPRESSION / ASSESSMENT AND PLAN / ED COURSE  Pertinent labs & imaging results that were available during my care of the patient were reviewed by me and considered in my medical decision making (see chart for details).        ____________________________________________   FINAL CLINICAL IMPRESSION(S) / ED DIAGNOSES  Final diagnoses:  NSTEMI (non-ST elevated myocardial infarction) ([place])      NEW MEDICATIONS STARTED DURING THIS VISIT:  New Prescriptions   No medications on file     Note:  This document was prepared using Dragon voice recognition software and may include unintentional dictation errors.    Nena Polio, MD 01/05/17 1335 Discussed with Dr. all read at Dr. Rogelia Boga request. Dr. Rayann Heman feels patient should be fine on just tell he doesn't need the stepdown unit. Does not want to do anything else but the aspirin Plavix heparin and nitroglycerin that we've done so far.   Nena Polio, MD 01/05/17 [phone]

## 2017-01-05 NOTE — ED Notes (Signed)
The EKG was completed and signed by Dr. Malinda. The EKG was also exported into the system. 

## 2017-01-05 NOTE — H&P (Addendum)
[place] [facility] [place] NAME: Gabriel [name]    MR#:  [id number]  DATE OF BIRTH:  02-Aug-1971  DATE OF ADMISSION:  01/05/2017  PRIMARY CARE PHYSICIAN: Patient, No Pcp Per   REQUESTING/REFERRING PHYSICIAN: Nena Polio, MD  CHIEF COMPLAINT:  Chest pain or shortness of breath  HISTORY OF PRESENT ILLNESS:  Gabriel [name]  is a 46 y.o. male with a known history of Cardiomyopathy with recent echocardiogram in April 2018 40-45% ejection fraction, chronic systolic congestive heart failure, diabetes medicines, hyponatremia, hypertension, COPD, chronic kidney disease stage III sprays into the ED with a chief complaint of chest pain associated with shortness of breath. Patient reports midsternal chest discomfort with no radiation, gets worse with deep inspiration. Patient feels like elephant is sitting on his chest, symptoms started at 9 AM this morning Patient sees [name] as an outpatient. Initial troponin at 0.07, but seems to be at 0.07 in April also. Patient reports he had stress test done 2 years ago with no acute abnormalities. EKG with no acute changes  PAST MEDICAL HISTORY:   Past Medical History:  Diagnosis Date  . Cardiomyopathy ([place])   . CHF (congestive heart failure) ([place])   . CKD (chronic kidney disease), stage III   . COPD (chronic obstructive pulmonary disease) ([place])   . Hypertension    Resolved since weight loss  . Obesity (BMI 30.0-34.9)   . Psoriasis   . Systolic CHF ([place])     PAST SURGICAL HISTOIRY:   Past Surgical History:  Procedure Laterality Date  . AMPUTATION    . FINGER AMPUTATION     Traumatic    SOCIAL HISTORY:   Social History  Substance Use Topics  . Smoking status: Current Some Day Smoker    Packs/day: 0.00    Years: 33.00    Types: Cigarettes  . Smokeless tobacco: Never Used  . Alcohol use 1.8 oz/week    3 Cans of beer per week     Comment: occassionally    FAMILY HISTORY:   Family  History  Problem Relation Age of Onset  . Diabetes Mellitus II Mother   . Hypertension Father   . Cancer Paternal Aunt   . Cancer Maternal Grandfather     DRUG ALLERGIES:   Allergies  Allergen Reactions  . Prednisone Other (See Comments)    Reaction: Hallucinations     REVIEW OF SYSTEMS:  CONSTITUTIONAL: No fever, fatigue or weakness.  EYES: No blurred or double vision.  EARS, NOSE, AND THROAT: No tinnitus or ear pain.  RESPIRATORY: No cough, shortness of breath, wheezing or hemoptysis.  CARDIOVASCULAR: reporting chest pain or shortness of breath,  deniesorthopnea, edema.  GASTROINTESTINAL: No nausea, vomiting, diarrhea or abdominal pain.  GENITOURINARY: No dysuria, hematuria.  ENDOCRINE: No polyuria, nocturia,  HEMATOLOGY: No anemia, easy bruising or bleeding SKIN: No rash or lesion. MUSCULOSKELETAL: No joint pain or arthritis.   NEUROLOGIC: No tingling, numbness, weakness.  PSYCHIATRY: No anxiety or depression.   MEDICATIONS AT HOME:   Prior to Admission medications   Medication Sig Start Date End Date Taking? Authorizing Provider  amiodarone (PACERONE) 200 MG tablet Take 1 tablet (200 mg total) by mouth 2 (two) times daily. 09/19/16  Yes Earleen Newport, MD  amLODipine (NORVASC) 10 MG tablet Take 1 tablet (10 mg total) by mouth daily. 09/19/16  Yes Earleen Newport, MD  aspirin 81 MG chewable tablet Chew 1 tablet (81 mg total) by mouth daily. Reported on  02/08/2016 09/19/16  Yes Earleen Newport, MD  ibuprofen (ADVIL,MOTRIN) 600 MG tablet Take 1 tablet (600 mg total) by mouth every 8 (eight) hours as needed. 12/06/16  Yes Darel Hong, MD  metoprolol succinate (TOPROL-XL) 25 MG 24 hr tablet Take 2 tablets (50 mg total) by mouth daily. Take with or immediately following a meal. 10/30/16  Yes Dustin Flock, MD  ondansetron (ZOFRAN ODT) 4 MG disintegrating tablet Take 1 tablet (4 mg total) by mouth every 8 (eight) hours as needed for nausea or vomiting. 12/06/16  Yes  Darel Hong, MD  potassium chloride SA (K-DUR,KLOR-CON) 20 MEQ tablet Take 0.5 tablets (10 mEq total) by mouth daily. Patient taking differently: Take 20 mEq by mouth daily.  10/30/16  Yes Dustin Flock, MD  sacubitril-valsartan (ENTRESTO) 24-26 MG Take 1 tablet by mouth 2 (two) times daily. 12/11/16  Yes [name] A, FNP  torsemide (DEMADEX) 20 MG tablet Take 2 tablets (40 mg total) by mouth daily. 12/04/16 01/05/17 Yes Hackney, Aura Fey, FNP  HYDROcodone-acetaminophen (NORCO) 5-325 MG tablet Take 1 tablet by mouth every 6 (six) hours as needed for severe pain. Patient not taking: Reported on 01/05/2017 12/06/16   Darel Hong, MD      VITAL SIGNS:  Blood pressure (!) 146/90, pulse 68, temperature 97.8 F (36.6 C), temperature source Oral, resp. rate (!) 23, height 6' (1.829 m), weight 114.8 kg (253 lb), SpO2 95 %.  PHYSICAL EXAMINATION:  GENERAL:  46 y.o.-year-old patient lying in the bed with no acute distress.  EYES: Pupils equal, round, reactive to light and accommodation. No scleral icterus. Extraocular muscles intact.  HEENT: Head atraumatic, normocephalic. Oropharynx and nasopharynx clear.  NECK:  Supple, no jugular venous distention. No thyroid enlargement, no tenderness.  LUNGS: Normal breath sounds bilaterally, no wheezing, rales,rhonchi or crepitation. No use of accessory muscles of respiration.  CARDIOVASCULAR:  No anterior chest wall tenderness on palpationS1, S2 normal. No murmurs, rubs, or gallops.  ABDOMEN: Soft, nontender, nondistended. Bowel sounds present. No organomegaly or mass.  EXTREMITIES: No pedal edema, cyanosis, or clubbing.  NEUROLOGIC: Cranial nerves II through XII are intact. Muscle strength 5/5 in all extremities. Sensation intact. Gait not checked.  PSYCHIATRIC: The patient is alert and oriented x 3.  SKIN: No obvious rash, lesion, or ulcer.   LABORATORY PANEL:   CBC  Recent Labs Lab 01/05/17 1118  WBC 9.4  HGB 15.0  HCT 44.2  PLT 163    ------------------------------------------------------------------------------------------------------------------  Chemistries   Recent Labs Lab 01/05/17 1118  NA 140  K 3.6  CL 107  CO2 26  GLUCOSE 142*  BUN 18  CREATININE 1.20  CALCIUM 8.7*  AST 32  ALT 29  ALKPHOS 74  BILITOT 2.0*   ------------------------------------------------------------------------------------------------------------------  Cardiac Enzymes  Recent Labs Lab 01/05/17 1118  TROPONINI 0.07*   ------------------------------------------------------------------------------------------------------------------  RADIOLOGY:  Dg Chest 2 View  Result Date: 01/05/2017 CLINICAL DATA:  Pt came to Ed via EMS c/o sob x2days. Reports today started having sharp chest pains in center of chest. Pt has history of copd, asthma, chf. VS stable at this time. Smoker. EXAM: CHEST  2 VIEW COMPARISON:  11/28/2016 FINDINGS: Cardiac silhouette is is mildly enlarged. No mediastinal hilar masses. No evidence of adenopathy. There is irregular thickening of the interstitial markings bilaterally, similar to the prior study. No lung consolidation. No pleural effusion or pneumothorax. Skeletal structures are intact. IMPRESSION: 1. No convincing acute cardiopulmonary disease. 2. There is irregular interstitial thickening which has been stable from multiple prior  studies, presumed chronic. A component of interstitial edema is possible. No evidence of pneumonia. Electronically Signed   By: Lajean Manes M.D.   On: 01/05/2017 11:46    EKG:   Orders placed or performed during the hospital encounter of 01/05/17  . ED EKG within 10 minutes  . ED EKG within 10 minutes  . ED EKG  . ED EKG    IMPRESSION AND PLAN:   Gabriel [name]  is a 46 y.o. male with a known history of Cardiomyopathy with recent echocardiogram in April 2018 40-45% ejection fraction, chronic systolic congestive heart failure, diabetes medicines, hyponatremia,  hypertension, COPD, chronic kidney disease stage III sprays into the ED with a chief complaint of chest pain associated with shortness of breath. Patient reports midsternal chest discomfort with no radiation, gets worse with deep inspiration. Patient feels like elephant is sitting on his chest  #Unstable angina Admit patient to telemetry. Cycle cardiac biomarkers CT angiogram of the chest is neg pulmonary embolism  Patient is started on heparin drip Patient had recent echocardiogram in April 2018 with EF 40-45% Cardiology [name] is consulted Continue home medications aspirin, metoprolol Morphine as needed for chest pain Nitroglycerin paste   #Chronic history of systolic congestive heart failure Not fluid overloaded currently. Continue home medication torsemide with the potassium supplements Continue aspirin, beta blocker andentresto  #Diabetes mellitus Sliding scale insulin. Check hemoglobin A1c  #Hyperlipidemia Check fasting lipid panel  #Hypertension Continue home medications Toprol-XL and amlodipine and titrate as needed  DVT prophylaxis on heparin drip  All the records are reviewed and case discussed with ED provider. Management plans discussed with the patient, family and they are in agreement.  CODE STATUS: fc , father is the healthcare power of attorney  TOTAL TIME TAKING CARE OF THIS PATIENT: 45 minutes.   Note: This dictation was prepared with Dragon dictation along with smaller phrase technology. Any transcriptional errors that result from this process are unintentional.  Nicholes Mango M.D on 01/05/2017 at 2:53 PM  Between 7am to 6pm - Pager - [phone]  After 6pm go to [email or web address] - password [email or web address] [facility]  Office  [phone]  CC: Primary care physician; Patient, No Pcp Per

## 2017-01-05 NOTE — ED Notes (Signed)
Elenore Rota, charge RN informed pt has troponin of 0.07 and needs a bed.

## 2017-01-06 ENCOUNTER — Encounter
Admission: EM | Disposition: A | Discharge status: Home / Self Care | Payer: Self-pay | Source: Home / Self Care | Attending: Emergency Medicine

## 2017-01-06 ENCOUNTER — Encounter: Payer: Self-pay | Admitting: Cardiovascular Disease

## 2017-01-06 DIAGNOSIS — I25110 Atherosclerotic heart disease of native coronary artery with unstable angina pectoris: Secondary | ICD-10-CM

## 2017-01-06 DIAGNOSIS — I200 Unstable angina: Secondary | ICD-10-CM

## 2017-01-06 DIAGNOSIS — I471 Supraventricular tachycardia: Secondary | ICD-10-CM

## 2017-01-06 HISTORY — PX: LEFT HEART CATH AND CORONARY ANGIOGRAPHY: CATH118249

## 2017-01-06 LAB — CBC
HCT: 43.6 % (ref 40.0–52.0)
Hemoglobin: 14.6 g/dL (ref 13.0–18.0)
MCH: 27.1 pg (ref 26.0–34.0)
MCHC: 33.6 g/dL (ref 32.0–36.0)
MCV: 80.9 fL (ref 80.0–100.0)
Platelets: 150 10*3/uL (ref 150–440)
RBC: 5.4 MIL/uL (ref 4.40–5.90)
RDW: 17.4 % — ABNORMAL HIGH (ref 11.5–14.5)
WBC: 9.8 10*3/uL (ref 3.8–10.6)

## 2017-01-06 LAB — GLUCOSE, CAPILLARY
Glucose-Capillary: 91 mg/dL (ref 65–99)
Glucose-Capillary: 74 mg/dL (ref 65–99)
Glucose-Capillary: 91 mg/dL (ref 65–99)
Glucose-Capillary: 101 mg/dL — ABNORMAL HIGH (ref 65–99)

## 2017-01-06 LAB — TROPONIN I: Troponin I: 0.06 ng/mL (ref <0.03)

## 2017-01-06 LAB — MAGNESIUM: Magnesium: 1.7 mg/dL (ref 1.7–2.4)

## 2017-01-06 LAB — HEPARIN LEVEL (UNFRACTIONATED): Heparin Unfractionated: 0.2 IU/mL — ABNORMAL LOW (ref 0.30–0.70)

## 2017-01-06 LAB — TSH: TSH: 1.357 u[IU]/mL (ref 0.350–4.500)

## 2017-01-06 SURGERY — LEFT HEART CATH AND CORONARY ANGIOGRAPHY
Anesthesia: Moderate Sedation

## 2017-01-06 MED ORDER — MIDAZOLAM HCL 2 MG/2ML IJ SOLN
INTRAMUSCULAR | Status: AC
  Filled 2017-01-06: qty 2

## 2017-01-06 MED ORDER — VERAPAMIL HCL 2.5 MG/ML IV SOLN
INTRAVENOUS | Status: DC | PRN
  Administered 2017-01-06: 2.5 mg via INTRA_ARTERIAL

## 2017-01-06 MED ORDER — SODIUM CHLORIDE 0.9 % WEIGHT BASED INFUSION: 1.0000 mL/kg/h | INTRAVENOUS | Status: DC

## 2017-01-06 MED ORDER — SODIUM CHLORIDE 0.9% FLUSH: 3.0000 mL | INTRAVENOUS | Status: DC | PRN

## 2017-01-06 MED ORDER — HEPARIN (PORCINE) IN NACL 2-0.9 UNIT/ML-% IJ SOLN
INTRAMUSCULAR | Status: AC
  Filled 2017-01-06: qty 500

## 2017-01-06 MED ORDER — SODIUM CHLORIDE 0.9 % IV SOLN: 250.0000 mL | INTRAVENOUS | Status: DC | PRN

## 2017-01-06 MED ORDER — HEPARIN BOLUS VIA INFUSION
1500.0000 [IU] | Freq: Once | INTRAVENOUS | Status: AC
  Administered 2017-01-06: 1500 [IU] via INTRAVENOUS
  Filled 2017-01-06: qty 1500

## 2017-01-06 MED ORDER — MIDAZOLAM HCL 2 MG/2ML IJ SOLN
INTRAMUSCULAR | Status: DC | PRN
  Administered 2017-01-06: 1 mg via INTRAVENOUS

## 2017-01-06 MED ORDER — ENOXAPARIN SODIUM 40 MG/0.4ML ~~LOC~~ SOLN
40.0000 mg | SUBCUTANEOUS | Status: DC
  Administered 2017-01-07: 40 mg via SUBCUTANEOUS
  Filled 2017-01-06: qty 0.4

## 2017-01-06 MED ORDER — SODIUM CHLORIDE 0.9% FLUSH: 3.0000 mL | Freq: Two times a day (BID) | INTRAVENOUS | Status: DC

## 2017-01-06 MED ORDER — SODIUM CHLORIDE 0.9 % IV SOLN: INTRAVENOUS | Status: DC

## 2017-01-06 MED ORDER — SODIUM CHLORIDE 0.9% FLUSH
3.0000 mL | Freq: Two times a day (BID) | INTRAVENOUS | Status: DC
  Administered 2017-01-06 – 2017-01-07 (×2): 3 mL via INTRAVENOUS

## 2017-01-06 MED ORDER — ALPRAZOLAM 0.25 MG PO TABS
0.2500 mg | ORAL_TABLET | Freq: Three times a day (TID) | ORAL | Status: DC | PRN
  Administered 2017-01-06 (×2): 0.25 mg via ORAL
  Filled 2017-01-06 (×2): qty 1

## 2017-01-06 MED ORDER — FENTANYL CITRATE (PF) 100 MCG/2ML IJ SOLN
INTRAMUSCULAR | Status: AC
  Filled 2017-01-06: qty 2

## 2017-01-06 MED ORDER — IOPAMIDOL (ISOVUE-300) INJECTION 61%
INTRAVENOUS | Status: DC | PRN
Start: 2017-01-06 — End: 2017-01-06
  Administered 2017-01-06: 75 mL via INTRA_ARTERIAL

## 2017-01-06 MED ORDER — VERAPAMIL HCL 2.5 MG/ML IV SOLN
INTRAVENOUS | Status: AC
Start: 2017-01-06 — End: 2017-01-06
  Filled 2017-01-06: qty 2

## 2017-01-06 MED ORDER — SODIUM CHLORIDE 0.9% FLUSH
3.0000 mL | Freq: Two times a day (BID) | INTRAVENOUS | Status: DC
  Administered 2017-01-06: 3 mL via INTRAVENOUS

## 2017-01-06 MED ORDER — SACUBITRIL-VALSARTAN 49-51 MG PO TABS
1.0000 | ORAL_TABLET | Freq: Two times a day (BID) | ORAL | Status: DC
  Administered 2017-01-06 – 2017-01-07 (×3): 1 via ORAL
  Filled 2017-01-06 (×5): qty 1

## 2017-01-06 MED ORDER — HEPARIN SODIUM (PORCINE) 1000 UNIT/ML IJ SOLN
INTRAMUSCULAR | Status: AC
  Filled 2017-01-06: qty 1

## 2017-01-06 MED ORDER — FENTANYL CITRATE (PF) 100 MCG/2ML IJ SOLN
INTRAMUSCULAR | Status: DC | PRN
  Administered 2017-01-06: 25 ug via INTRAVENOUS

## 2017-01-06 MED ORDER — TORSEMIDE 20 MG PO TABS
40.0000 mg | ORAL_TABLET | Freq: Two times a day (BID) | ORAL | Status: DC
  Administered 2017-01-06: 40 mg via ORAL
  Filled 2017-01-06: qty 2

## 2017-01-06 MED ORDER — POTASSIUM CHLORIDE CRYS ER 20 MEQ PO TBCR
30.0000 meq | EXTENDED_RELEASE_TABLET | Freq: Once | ORAL | Status: AC
  Administered 2017-01-06: 30 meq via ORAL
  Filled 2017-01-06: qty 1

## 2017-01-06 MED ORDER — SODIUM CHLORIDE 0.9 % WEIGHT BASED INFUSION
3.0000 mL/kg/h | INTRAVENOUS | Status: DC
Start: 2017-01-07 — End: 2017-01-06
  Administered 2017-01-06: 3 mL/kg/h via INTRAVENOUS

## 2017-01-06 MED ORDER — HEPARIN SODIUM (PORCINE) 1000 UNIT/ML IJ SOLN
INTRAMUSCULAR | Status: DC | PRN
  Administered 2017-01-06: 5000 [IU] via INTRAVENOUS

## 2017-01-06 SURGICAL SUPPLY — 7 items
CATH OPTITORQUE JACKY 4.0 5F (CATHETERS) ×1 IMPLANT
DEVICE RAD TR BAND REGULAR (VASCULAR PRODUCTS) ×1 IMPLANT
GLIDESHEATH SLEND SS 6F .021 (SHEATH) ×1 IMPLANT
GUIDEWIRE .025 260CM (WIRE) ×1 IMPLANT
KIT MANI 3VAL PERCEP (MISCELLANEOUS) ×2 IMPLANT
PACK CARDIAC CATH (CUSTOM PROCEDURE TRAY) ×2 IMPLANT
WIRE ROSEN-J .035X260CM (WIRE) ×1 IMPLANT

## 2017-01-06 NOTE — Progress Notes (Signed)
[place] at [place] NAME: Gabriel [name]    MR#:  [id number]  [place]:  1971/05/18  SUBJECTIVE:   Patient here due to shortness of breath and chest pain and is status post cardiac catheterization today showing no evidence of significant coronary artery disease. Still complaining of some shortness of breath and chest pain. Noted to be in CHF and placed on high-dose oral diuretics as per cardiology.  REVIEW OF SYSTEMS:    Review of Systems  Constitutional: Negative for chills and fever.  HENT: Negative for congestion and tinnitus.   Eyes: Negative for blurred vision and double vision.  Respiratory: Positive for shortness of breath. Negative for cough and wheezing.   Cardiovascular: Positive for chest pain. Negative for orthopnea and PND.  Gastrointestinal: Negative for abdominal pain, diarrhea, nausea and vomiting.  Genitourinary: Negative for dysuria and hematuria.  Neurological: Positive for weakness. Negative for dizziness, sensory change and focal weakness.  All other systems reviewed and are negative.   Nutrition: Heart healthy Tolerating Diet: Yes Tolerating PT: Ambulatory  DRUG ALLERGIES:   Allergies  Allergen Reactions  . Prednisone Other (See Comments)    Reaction: Hallucinations     VITALS:  Blood pressure (!) 159/106, pulse 69, temperature 97.6 F (36.4 C), temperature source Oral, resp. rate 14, height 6' (1.829 m), weight 116.1 kg (256 lb), SpO2 95 %.  PHYSICAL EXAMINATION:   Physical Exam  GENERAL:  46 y.o.-year-old obese patient lying in bed in no acute distress.  EYES: Pupils equal, round, reactive to light and accommodation. No scleral icterus. Extraocular muscles intact.  HEENT: Head atraumatic, normocephalic. Oropharynx and nasopharynx clear.  NECK:  Supple, + jugular venous distention. No thyroid enlargement, no tenderness.  LUNGS: Normal breath sounds bilaterally, no wheezing, bibasilar rales, rhonchi.  No use of accessory muscles of respiration.  CARDIOVASCULAR: S1, S2 normal. No murmurs, rubs, or gallops.  ABDOMEN: Soft, nontender, nondistended. Bowel sounds present. No organomegaly or mass.  EXTREMITIES: No cyanosis, clubbing or edema b/l.    NEUROLOGIC: Cranial nerves II through XII are intact. No focal Motor or sensory deficits b/l. Globally weak.   PSYCHIATRIC: The patient is alert and oriented x 3.  SKIN: No obvious rash, lesion, or ulcer.    LABORATORY PANEL:   CBC  Recent Labs Lab 01/06/17 0544  WBC 9.8  HGB 14.6  HCT 43.6  PLT 150   ------------------------------------------------------------------------------------------------------------------  Chemistries   Recent Labs Lab 01/05/17 1118 01/06/17 0544  NA 140  --   K 3.6  --   CL 107  --   CO2 26  --   GLUCOSE 142*  --   BUN 18  --   CREATININE 1.20  --   CALCIUM 8.7*  --   MG  --  1.7  AST 32  --   ALT 29  --   ALKPHOS 74  --   BILITOT 2.0*  --    ------------------------------------------------------------------------------------------------------------------  Cardiac Enzymes  Recent Labs Lab 01/06/17 0141  TROPONINI 0.06*   ------------------------------------------------------------------------------------------------------------------  RADIOLOGY:  Dg Chest 2 View  Result Date: 01/05/2017 CLINICAL DATA:  Pt came to Ed via EMS c/o sob x2days. Reports today started having sharp chest pains in center of chest. Pt has history of copd, asthma, chf. VS stable at this time. Smoker. EXAM: CHEST  2 VIEW COMPARISON:  11/28/2016 FINDINGS: Cardiac silhouette is is mildly enlarged. No mediastinal hilar masses. No evidence of adenopathy. There is irregular thickening of the  interstitial markings bilaterally, similar to the prior study. No lung consolidation. No pleural effusion or pneumothorax. Skeletal structures are intact. IMPRESSION: 1. No convincing acute cardiopulmonary disease. 2. There is irregular  interstitial thickening which has been stable from multiple prior studies, presumed chronic. A component of interstitial edema is possible. No evidence of pneumonia. Electronically Signed   By: Lajean Manes M.D.   On: 01/05/2017 11:46   Ct Angio Chest Pe W Or Wo Contrast  Result Date: 01/05/2017 CLINICAL DATA:  past history of diabetes type 2 which improved with weight loss and smoking. Patient reports increasing shortness of breath with exertion for some weeks and then chest pain coming and going for the last 2 days. It began again at 9:00 this morning with shortness of breath and nausea and has continued. It feels like an elephant sitting on his chest. Hx of CHF, EXAM: CT ANGIOGRAPHY CHEST WITH CONTRAST TECHNIQUE: Multidetector CT imaging of the chest was performed using the standard protocol during bolus administration of intravenous contrast. Multiplanar CT image reconstructions and MIPs were obtained to evaluate the vascular anatomy. CONTRAST:  75 mL Isovue 370 IV COMPARISON:  10/28/2016 FINDINGS: Cardiovascular: Satisfactory opacification of pulmonary arteries noted, and there is no evidence of pulmonary emboli. Incomplete opacification of the thoracic aorta. No evidence of aneurysm. Scattered coronary calcifications. No pericardial effusion. Mediastinum/Nodes: 2.2 cm subcarinal adenopathy previously 2 cm. Multiple prominent paraesophageal, prevascular, right paratracheal, precarinal, and AP window lymph nodes which are largely stable. No axillary adenopathy. Lungs/Pleura: Pleural effusions right greater than left, slightly increased. No pneumothorax. Low lung volumes. Small calcified granulomas in both lower lobes. Upper Abdomen: No acute findings Musculoskeletal: No fracture or other worrisome lesions. Review of the MIP images confirms the above findings. IMPRESSION: 1. Negative for acute PE. 2. Pleural effusions right greater than left slightly increased since prior study. 3. Chronic mediastinal  adenopathy, largely stable. 4. Atherosclerosis, including coronary artery disease. Please note that although the presence of coronary artery calcium documents the presence of coronary artery disease, the severity of this disease and any potential stenosis cannot be assessed on this non-gated CT examination. Assessment for potential risk factor modification, dietary therapy or pharmacologic therapy may be warranted, if clinically indicated. Electronically Signed   By: Lucrezia Europe M.D.   On: 01/05/2017 14:58     ASSESSMENT AND PLAN:   46 year old male with past medical history of nonischemic cardiomyopathy, chronic systolic CHF, COPD, obesity, hypertension who presented to the hospital due to shortness of breath and chest pain.  1. Chest pain-given patient's extensive cardiac history and risk factors and is mildly elevated troponin patient underwent a cardiac catheterization which showed no significant coronary artery disease that needed intervention. -Still having chest pain but medical management for now. -Continue aspirin, beta blocker, statin, nitroglycerin, oxygen and supportive care.  2. CHF-acute on chronic systolic CHF. -Continue higher doses of oral torsemide, continue metoprolol, Entresto  3. Diabetes type 2 without complication-continue sliding scale insulin.  4. Hyperlipidemia-continue Crestor.  5. COPD-no acute exacerbation.  6. Anxiety-continue when necessary Xanax     All the records are reviewed and case discussed with Care Management/Social Worker. Management plans discussed with the patient, family and they are in agreement.  CODE STATUS: Full code  DVT Prophylaxis: Lovenox  TOTAL TIME TAKING CARE OF THIS PATIENT: 30 minutes.   POSSIBLE D/C IN 1-2 DAYS, DEPENDING ON CLINICAL CONDITION.   Henreitta Leber M.D on 01/06/2017 at 3:36 PM  Between 7am to 6pm - Pager - [phone]  After 6pm go to [email or web address] - Proofreader  [facility]  Office  [phone]  CC: Primary care physician; Patient, No Pcp Per

## 2017-01-06 NOTE — Care Management (Signed)
He has been placed in observation for chest pain.  He has not completed the eligibility requirements for [facility].  He has not kept follow up appointments at the [facility] or with cardiology.

## 2017-01-06 NOTE — Progress Notes (Signed)
Patient Name: Gabriel [name]. Date of Encounter: 01/06/2017  Primary Cardiologist: [facility] Problem List     Active Problems:   Unstable angina [facility])     Subjective   Has been noting 3-4 day history of chest tightness with associated SOB. Symptoms initially only at exertion, now both with exertion and at rest. Some abdominal destination and early satiety. No LE swelling. Symptoms have been fairly constant. EKG with dynamic changes. On heparin gtt. Labs stable. BP 097D systolic.   Inpatient Medications    Scheduled Meds: . amLODipine  10 mg Oral Daily  . aspirin  81 mg Oral Daily  . insulin aspart  0-5 Units Subcutaneous QHS  . insulin aspart  0-9 Units Subcutaneous TID WC  . metoprolol succinate  50 mg Oral Daily  . potassium chloride SA  10 mEq Oral Daily  . rosuvastatin  20 mg Oral q1800  . sacubitril-valsartan  1 tablet Oral BID  . sodium chloride flush  3 mL Intravenous Q12H  . torsemide  40 mg Oral Daily   Continuous Infusions: . sodium chloride    . [START ON 01/07/2017] sodium chloride     Followed by  . [START ON 01/07/2017] sodium chloride    . heparin 1,850 Units/hr (01/06/17 5329)   PRN Meds: sodium chloride, acetaminophen, morphine injection, nitroGLYCERIN, ondansetron (ZOFRAN) IV, ondansetron, sodium chloride flush   Vital Signs    Vitals:   01/05/17 1530 01/05/17 1637 01/05/17 2040 01/06/17 0503  BP: (!) 142/101 (!) 146/94 (!) 149/89 (!) 146/99  Pulse: 66 64 75 67  Resp: 20 16 18 18   Temp:  98.2 F (36.8 C) 97.8 F (36.6 C) 97.7 F (36.5 C)  TempSrc:  Oral Oral Oral  SpO2: 92% 95% 95% 92%  Weight:  256 lb 12.8 oz (116.5 kg)    Height:  6' (1.829 m)      Intake/Output Summary (Last 24 hours) at 01/06/17 0735 Last data filed at 01/06/17 9242  Gross per 24 hour  Intake           395.41 ml  Output             1150 ml  Net          -754.59 ml   Filed Weights   01/05/17 1117 01/05/17 1637  Weight: 253 lb (114.8 kg) 256 lb 12.8  oz (116.5 kg)    Physical Exam    GEN: Well nourished, well developed, in no acute distress.  HEENT: Grossly normal.  Neck: Supple, no JVD, carotid bruits, or masses. Cardiac: RRR, no murmurs, rubs, or gallops. No clubbing, cyanosis, edema.  Radials/DP/PT 2+ and equal bilaterally.  Respiratory:  Respirations regular and unlabored, clear to auscultation bilaterally. GI: Soft, nontender, nondistended, BS + x 4. MS: no deformity or atrophy. Skin: warm and dry, no rash. Neuro:  Strength and sensation are intact. Psych: AAOx3.  Normal affect.  Labs    CBC  Recent Labs  01/05/17 1118 01/06/17 0544  WBC 9.4 9.8  HGB 15.0 14.6  HCT 44.2 43.6  MCV 80.3 80.9  PLT 163 683   Basic Metabolic Panel  Recent Labs  01/05/17 1118  NA 140  K 3.6  CL 107  CO2 26  GLUCOSE 142*  BUN 18  CREATININE 1.20  CALCIUM 8.7*   Liver Function Tests  Recent Labs  01/05/17 1118  AST 32  ALT 29  ALKPHOS 74  BILITOT 2.0*  PROT 6.7  ALBUMIN 3.6  No results for input(s): LIPASE, AMYLASE in the last 72 hours. Cardiac Enzymes  Recent Labs  01/05/17 1943 01/05/17 2251 01/06/17 0141  TROPONINI 0.06* 0.06* 0.06*   BNP Invalid input(s): POCBNP D-Dimer No results for input(s): DDIMER in the last 72 hours. Hemoglobin A1C No results for input(s): HGBA1C in the last 72 hours. Fasting Lipid Panel No results for input(s): CHOL, HDL, LDLCALC, TRIG, CHOLHDL, LDLDIRECT in the last 72 hours. Thyroid Function Tests No results for input(s): TSH, T4TOTAL, T3FREE, THYROIDAB in the last 72 hours.  Invalid input(s): FREET3  Telemetry    Sinus rhythm rare runs of atrial tach, occasional PVCs - Personally Reviewed  ECG    n/a - Personally Reviewed  Radiology    Dg Chest 2 View  Result Date: 01/05/2017 IMPRESSION: 1. No convincing acute cardiopulmonary disease. 2. There is irregular interstitial thickening which has been stable from multiple prior studies, presumed chronic. A component  of interstitial edema is possible. No evidence of pneumonia. Electronically Signed   By: Lajean Manes M.D.   On: 01/05/2017 11:46   Ct Angio Chest Pe W Or Wo Contrast  Result Date: 01/05/2017 IMPRESSION: 1. Negative for acute PE. 2. Pleural effusions right greater than left slightly increased since prior study. 3. Chronic mediastinal adenopathy, largely stable. 4. Atherosclerosis, including coronary artery disease. Please note that although the presence of coronary artery calcium documents the presence of coronary artery disease, the severity of this disease and any potential stenosis cannot be assessed on this non-gated CT examination. Assessment for potential risk factor modification, dietary therapy or pharmacologic therapy may be warranted, if clinically indicated. Electronically Signed   By: Lucrezia Europe M.D.   On: 01/05/2017 14:58    Cardiac Studies   TTE 11/28/2016: Study Conclusions  - Left ventricle: The cavity size was mildly dilated. There was   mild concentric hypertrophy. Systolic function was mildly to   moderately reduced. The estimated ejection fraction was in the   range of 40% to 45%. Diffuse hypokinesis. Left ventricular   diastolic function parameters were normal. - Aortic root: The aortic root was mildly dilated. - Mitral valve: There was mild regurgitation. - Left atrium: The atrium was moderately dilated. - Pulmonary arteries: Systolic pressure could not be accurately   estimated. - Inferior vena cava: The vessel was dilated. The respirophasic   diameter changes were blunted (< 50%), consistent with elevated   central venous pressure.  Impressions:  - Challenging image quality.  Patient Profile     46 y.o. 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   Assessment & Plan    1. Unstable angina/ACS: -Currently with chest tightness about the same that he presented with -Heparin gtt -For cardiac cath today with Dr. Fletcher Anon, MD -ASA -Toprol XL -Crestor -Risks and benefits of cardiac catheterization have been discussed with the patient including risks of bleeding, bruising, infection, kidney damage, stroke, heart attack, and death. The patient understands these risks and is willing to proceed with the procedure. All questions have been answered and concerns listened to  2. Chronic systolic CHF: -He does not appear to be grossly volume overloaded at this time -Increase Entresto to 41/59 mg bid -Stop amlodipine given his cardiomyopathy -Toprol XL -  Torsemide  -Recent TTE as above  3. NSVT: -Previously on amiodarone, stopped -Toprol as above -Replete potassium to goal of at least 4.0 -Check magnesium level and replete to goal of at leasst 2.0 -Check TSH -Further recommendations pending LHC -Consider EP evaluation, possibly as an outpatient if minimal ectopy is noted inpatient   4. HTN: -As above  5. Ongoing tobacco abuse: -Cessation is advised  6. Obesity: -Weight is advised    Signed, Christell Faith, PA-C [facility] Pager: [phone] 01/06/2017, 7:35 AM

## 2017-01-06 NOTE — Progress Notes (Signed)
Medicated for pain during the night. Slept well. On heparin drip. A&O. Independent. For cardiac cath today.

## 2017-01-06 NOTE — Progress Notes (Addendum)
ANTICOAGULATION CONSULT NOTE - Initial Consult  Pharmacy Consult for heparin Indication: chest pain/ACS  Allergies  Allergen Reactions  . Prednisone Other (See Comments)    Reaction: Hallucinations     Patient Measurements: Height: 6' (182.9 cm) Weight: 256 lb 12.8 oz (116.5 kg) IBW/kg (Calculated) : 77.6 Heparin Dosing Weight: 102.3 kg   Vital Signs: Temp: 97.7 F (36.5 C) (05/21 0503) Temp Source: Oral (05/21 0503) BP: 146/99 (05/21 0503) Pulse Rate: 67 (05/21 0503)  Labs:  Recent Labs  01/05/17 1118 01/05/17 1356  01/05/17 1943 01/05/17 2251 01/06/17 0141 01/06/17 0544  HGB 15.0  --   --   --   --   --  14.6  HCT 44.2  --   --   --   --   --  43.6  PLT 163  --   --   --   --   --  150  APTT  --  29  --   --   --   --   --   LABPROT  --  14.4  --   --   --   --   --   INR  --  1.11  --   --   --   --   --   HEPARINUNFRC  --   --   --  0.12*  --   --  0.20*  CREATININE 1.20  --   --   --   --   --   --   TROPONINI 0.07*  --   < > 0.06* 0.06* 0.06*  --   < > = values in this interval not displayed.  Estimated Creatinine Clearance: 100.3 mL/min (by C-G formula based on SCr of 1.2 mg/dL).   Medical History: Past Medical History:  Diagnosis Date  . Cardiomyopathy ([place])   . Chronic systolic dysfunction of left ventricle    presumed nonischemic, though patient denies having prior cath  . CKD (chronic kidney disease), stage III   . COPD (chronic obstructive pulmonary disease) ([place])   . Hypertension    Resolved since weight loss  . Obesity (BMI 30.0-34.9)   . Psoriasis   . Tobacco abuse    Assessment: 46 yo male who presents to the ED with chest pain and shortness of breath. Pharmacy consulted for heparin drip. Patient not on any prior anticoagulation at home.   Baseline labs ordered.   Goal of Therapy:  Heparin level 0.3-0.7 units/ml Monitor platelets by anticoagulation protocol: Yes   Plan:  5/20 2000 HL subtherapeutic at 0.12. Will order Heparin  bolus of 3000 untis and increase infusion to 1650u/hr. Will recheck heparin level in 6 hours. CBC with AM labs per protocol.   5/21 AM heparin level 0.20. 1500 unit bolus and increase rate to 1850 units/hr. Recheck in 6 hours.  Eloise Harman, PharmD, BCPS Clinical Pharmacist 01/06/2017 6:15 AM

## 2017-01-07 ENCOUNTER — Other Ambulatory Visit: Payer: Self-pay

## 2017-01-07 DIAGNOSIS — I5023 Acute on chronic systolic (congestive) heart failure: Secondary | ICD-10-CM

## 2017-01-07 DIAGNOSIS — I5022 Chronic systolic (congestive) heart failure: Secondary | ICD-10-CM

## 2017-01-07 LAB — BASIC METABOLIC PANEL
Anion gap: 9 (ref 5–15)
BUN: 21 mg/dL — ABNORMAL HIGH (ref 6–20)
CO2: 30 mmol/L (ref 22–32)
Calcium: 9.3 mg/dL (ref 8.9–10.3)
Chloride: 100 mmol/L — ABNORMAL LOW (ref 101–111)
Creatinine, Ser: 1.27 mg/dL — ABNORMAL HIGH (ref 0.61–1.24)
GFR calc Af Amer: >60 mL/min (ref >60)
GFR calc non Af Amer: >60 mL/min (ref >60)
Glucose, Bld: 133 mg/dL — ABNORMAL HIGH (ref 65–99)
Potassium: 4.1 mmol/L (ref 3.5–5.1)
Sodium: 139 mmol/L (ref 135–145)

## 2017-01-07 LAB — CBC
HCT: 52.8 % — ABNORMAL HIGH (ref 40.0–52.0)
Hemoglobin: 17.6 g/dL (ref 13.0–18.0)
MCH: 27.1 pg (ref 26.0–34.0)
MCHC: 33.3 g/dL (ref 32.0–36.0)
MCV: 81.4 fL (ref 80.0–100.0)
Platelets: 185 10*3/uL (ref 150–440)
RBC: 6.48 MIL/uL — ABNORMAL HIGH (ref 4.40–5.90)
RDW: 17.4 % — ABNORMAL HIGH (ref 11.5–14.5)
WBC: 9.1 10*3/uL (ref 3.8–10.6)

## 2017-01-07 LAB — GLUCOSE, CAPILLARY
Glucose-Capillary: 91 mg/dL (ref 65–99)
Glucose-Capillary: 133 mg/dL — ABNORMAL HIGH (ref 65–99)

## 2017-01-07 LAB — HEMOGLOBIN A1C
Hgb A1c MFr Bld: 6.5 % — ABNORMAL HIGH (ref 4.8–5.6)
Mean Plasma Glucose: 140 mg/dL

## 2017-01-07 LAB — HIV ANTIBODY (ROUTINE TESTING W REFLEX): HIV Screen 4th Generation wRfx: NONREACTIVE

## 2017-01-07 MED ORDER — METOPROLOL SUCCINATE ER 25 MG PO TB24: 50.0000 mg | ORAL_TABLET | Freq: Every day | ORAL | 1 refills | Status: DC

## 2017-01-07 MED ORDER — POTASSIUM CHLORIDE CRYS ER 20 MEQ PO TBCR: 20.0000 meq | EXTENDED_RELEASE_TABLET | Freq: Every day | ORAL | 1 refills | Status: DC

## 2017-01-07 MED ORDER — TORSEMIDE 20 MG PO TABS: 60.0000 mg | ORAL_TABLET | Freq: Every day | ORAL | 1 refills | Status: DC

## 2017-01-07 MED ORDER — AMLODIPINE BESYLATE 10 MG PO TABS: 10.0000 mg | ORAL_TABLET | Freq: Every day | ORAL | 1 refills | Status: DC

## 2017-01-07 MED ORDER — AMIODARONE HCL 200 MG PO TABS: 200.0000 mg | ORAL_TABLET | Freq: Two times a day (BID) | ORAL | 1 refills | Status: DC

## 2017-01-07 MED ORDER — TORSEMIDE 20 MG PO TABS
60.0000 mg | ORAL_TABLET | Freq: Every day | ORAL | Status: DC
  Administered 2017-01-07: 60 mg via ORAL
  Filled 2017-01-07: qty 3

## 2017-01-07 MED ORDER — SPIRONOLACTONE 25 MG PO TABS
25.0000 mg | ORAL_TABLET | Freq: Every day | ORAL | Status: DC
  Administered 2017-01-07: 25 mg via ORAL
  Filled 2017-01-07: qty 1

## 2017-01-07 MED ORDER — SPIRONOLACTONE 25 MG PO TABS: 25.0000 mg | ORAL_TABLET | Freq: Every day | ORAL | 1 refills | Status: DC

## 2017-01-07 NOTE — Plan of Care (Signed)
Problem: Safety: Goal: Ability to remain free from injury will improve Outcome: Progressing Fall precautions in place, non skid socks  Problem: Pain Managment: Goal: General experience of comfort will improve Outcome: Progressing No complaints of pain this shift, prn medications  Problem: Tissue Perfusion: Goal: Risk factors for ineffective tissue perfusion will decrease Outcome: Progressing SQ Lovenox  Problem: Activity: Goal: Ability to return to baseline activity level will improve Outcome: Progressing Pt reports sleeping well last night  Problem: Cardiovascular: Goal: Ability to achieve and maintain adequate cardiovascular perfusion will improve Outcome: Progressing Cath site benign

## 2017-01-07 NOTE — Progress Notes (Signed)
Pt discharged to home via wc.  Instructions and rx given to pt.  Questions answered.  No distress.  

## 2017-01-07 NOTE — Care Management (Signed)
Discussed the need to complete his application for [facility].  Patient replies " I ain't giving them any information about my Dad. Just give me my prescriptions and I will take them to [facility] and if no one will give me the money then I will go without."  Discussed the need to speak with the agency about the "intent" of the questions they are asking.  have found often that what he thinks that may be an invasion of privacy usually is not.  Alo odiscussed that he has not completed and application for [facility].  Say he called them and they never called back.  Discussed that he needs to be accountable and assume responsibility for his care planning.  Provided patient with another application for [facility]. Faxed [facility] and instructed to proceed to the clinic at discharge.

## 2017-01-07 NOTE — Discharge Summary (Signed)
[place] [facility] [place] NAME: Alvar Malinoski    MR#:  [id number]  DATE OF BIRTH:  1971-03-27  DATE OF ADMISSION:  01/05/2017 ADMITTING PHYSICIAN: Nicholes Mango, MD  DATE OF DISCHARGE: 01/07/2017  1:34 PM  PRIMARY CARE PHYSICIAN: Patient, No Pcp Per    ADMISSION DIAGNOSIS:  Dyspnea [R06.00] NSTEMI (non-ST elevated myocardial infarction) ([place]) [I21.4]  DISCHARGE DIAGNOSIS:  Active Problems:   Unstable angina ([place])   SECONDARY DIAGNOSIS:   Past Medical History:  Diagnosis Date  . Cardiomyopathy ([place])   . Chronic systolic dysfunction of left ventricle    presumed nonischemic, though patient denies having prior cath  . CKD (chronic kidney disease), stage III   . COPD (chronic obstructive pulmonary disease) ([place])   . Hypertension    Resolved since weight loss  . Obesity (BMI 30.0-34.9)   . Psoriasis   . Tobacco abuse     HOSPITAL COURSE:   46 year old male with past medical history of nonischemic cardiomyopathy, chronic systolic CHF, COPD, obesity, hypertension who presented to the hospital due to shortness of breath and chest pain.  1. Chest pain-given patient's extensive cardiac history and risk factors and his mildly elevated troponin patient underwent a cardiac catheterization which showed no significant coronary artery disease that needed intervention. -She is currently chest pain-free and hemodynamically stable. He is being discharged home on aspirin, metoprolol, statin and nitroglycerin.  2. CHF-acute on chronic systolic CHF. -Patient's oral torsemide dose was increased by cardiology and he is being discharged on that along with his metoprolol, Entresto and Aldactone. Follow up at the CHF clinic.  - shortness of breath much improved.   3. Hyperlipidemia-continue Crestor.  4. COPD-no acute exacerbation. While in hospital. - pt. Advised to quit smoking.   DISCHARGE CONDITIONS:   Stable.   CONSULTS OBTAINED:  Treatment  Team:  Thompson Grayer, MD  DRUG ALLERGIES:   Allergies  Allergen Reactions  . Prednisone Other (See Comments)    Reaction: Hallucinations     DISCHARGE MEDICATIONS:   Allergies as of 01/07/2017      Reactions   Prednisone Other (See Comments)   Reaction: Hallucinations      Medication List    STOP taking these medications   HYDROcodone-acetaminophen 5-325 MG tablet Commonly known as:  NORCO     TAKE these medications   amiodarone 200 MG tablet Commonly known as:  PACERONE Take 1 tablet (200 mg total) by mouth 2 (two) times daily.   amLODipine 10 MG tablet Commonly known as:  NORVASC Take 1 tablet (10 mg total) by mouth daily.   aspirin 81 MG chewable tablet Chew 1 tablet (81 mg total) by mouth daily. Reported on 02/08/2016   ibuprofen 600 MG tablet Commonly known as:  ADVIL,MOTRIN Take 1 tablet (600 mg total) by mouth every 8 (eight) hours as needed.   metoprolol succinate 25 MG 24 hr tablet Commonly known as:  TOPROL-XL Take 2 tablets (50 mg total) by mouth daily. Take with or immediately following a meal.   ondansetron 4 MG disintegrating tablet Commonly known as:  ZOFRAN ODT Take 1 tablet (4 mg total) by mouth every 8 (eight) hours as needed for nausea or vomiting.   potassium chloride SA 20 MEQ tablet Commonly known as:  K-DUR,KLOR-CON Take 1 tablet (20 mEq total) by mouth daily.   sacubitril-valsartan 24-26 MG Commonly known as:  ENTRESTO Take 1 tablet by mouth 2 (two) times daily.   spironolactone 25 MG tablet Commonly known  as:  ALDACTONE Take 1 tablet (25 mg total) by mouth daily. Start taking on:  01/08/2017   torsemide 20 MG tablet Commonly known as:  DEMADEX Take 3 tablets (60 mg total) by mouth daily. Start taking on:  01/08/2017 What changed:  how much to take         DISCHARGE INSTRUCTIONS:   DIET:  Cardiac diet  DISCHARGE CONDITION:  Stable  ACTIVITY:  Activity as tolerated  OXYGEN:  Home Oxygen: No.   Oxygen Delivery:  room air  DISCHARGE LOCATION:  home   If you experience worsening of your admission symptoms, develop shortness of breath, life threatening emergency, suicidal or homicidal thoughts you must seek medical attention immediately by calling 911 or calling your MD immediately  if symptoms less severe.  You Must read complete instructions/literature along with all the possible adverse reactions/side effects for all the Medicines you take and that have been prescribed to you. Take any new Medicines after you have completely understood and accpet all the possible adverse reactions/side effects.   Please note  You were cared for by a hospitalist during your hospital stay. If you have any questions about your discharge medications or the care you received while you were in the hospital after you are discharged, you can call the unit and asked to speak with the hospitalist on call if the hospitalist that took care of you is not available. Once you are discharged, your primary care physician will handle any further medical issues. Please note that NO REFILLS for any discharge medications will be authorized once you are discharged, as it is imperative that you return to your primary care physician (or establish a relationship with a primary care physician if you do not have one) for your aftercare needs so that they can reassess your need for medications and monitor your lab values.     Today   Shortness of breath, CP improved.  NO acute complaints presently. Will d/c home today.   VITAL SIGNS:  Blood pressure (!) 151/91, pulse 77, temperature 98.6 F (37 C), temperature source Oral, resp. rate 14, height 6' (1.829 m), weight 116.1 kg (256 lb), SpO2 96 %.  I/O:   Intake/Output Summary (Last 24 hours) at 01/07/17 1613 Last data filed at 01/07/17 1003  Gross per 24 hour  Intake              483 ml  Output             5250 ml  Net            -4767 ml    PHYSICAL EXAMINATION:  GENERAL:  46  y.o.-year-old patient lying in the bed with no acute distress.  EYES: Pupils equal, round, reactive to light and accommodation. No scleral icterus. Extraocular muscles intact.  HEENT: Head atraumatic, normocephalic. Oropharynx and nasopharynx clear.  NECK:  Supple, no jugular venous distention. No thyroid enlargement, no tenderness.  LUNGS: Normal breath sounds bilaterally, no wheezing, rales,rhonchi. No use of accessory muscles of respiration.  CARDIOVASCULAR: S1, S2 normal. No murmurs, rubs, or gallops.  ABDOMEN: Soft, non-tender, non-distended. Bowel sounds present. No organomegaly or mass.  EXTREMITIES: No pedal edema, cyanosis, or clubbing.  NEUROLOGIC: Cranial nerves II through XII are intact. No focal motor or sensory defecits b/l.  PSYCHIATRIC: The patient is alert and oriented x 3. Good affect.  SKIN: No obvious rash, lesion, or ulcer.   DATA REVIEW:   CBC  Recent Labs Lab 01/07/17 0906  WBC 9.1  HGB 17.6  HCT 52.8*  PLT 185    Chemistries   Recent Labs Lab 01/05/17 1118 01/06/17 0544 01/07/17 0906  NA 140  --  139  K 3.6  --  4.1  CL 107  --  100*  CO2 26  --  30  GLUCOSE 142*  --  133*  BUN 18  --  21*  CREATININE 1.20  --  1.27*  CALCIUM 8.7*  --  9.3  MG  --  1.7  --   AST 32  --   --   ALT 29  --   --   ALKPHOS 74  --   --   BILITOT 2.0*  --   --     Cardiac Enzymes  Recent Labs Lab 01/06/17 0141  TROPONINI 0.06*    Microbiology Results  Results for orders placed or performed during the hospital encounter of 03/15/16  CULTURE, BLOOD (ROUTINE X 2) w Reflex to ID Panel     Status: None   Collection Time: 03/15/16  5:03 AM  Result Value Ref Range Status   Specimen Description BLOOD LEFT HAND  Final   Special Requests   Final    BOTTLES DRAWN AEROBIC AND ANAEROBIC AER 6ML ANA 10ML   Culture NO GROWTH 5 DAYS  Final   Report Status 03/20/2016 FINAL  Final  CULTURE, BLOOD (ROUTINE X 2) w Reflex to ID Panel     Status: None   Collection Time:  03/15/16  5:03 AM  Result Value Ref Range Status   Specimen Description BLOOD LEFT AC  Final   Special Requests   Final    BOTTLES DRAWN AEROBIC AND ANAEROBIC AER 14ML ANA 6ML   Culture NO GROWTH 5 DAYS  Final   Report Status 03/20/2016 FINAL  Final  MRSA PCR Screening     Status: None   Collection Time: 03/15/16 12:27 PM  Result Value Ref Range Status   MRSA by PCR NEGATIVE NEGATIVE Final    Comment:        The GeneXpert MRSA Assay (FDA approved for NASAL specimens only), is one component of a comprehensive MRSA colonization surveillance program. It is not intended to diagnose MRSA infection nor to guide or monitor treatment for MRSA infections.     RADIOLOGY:  No results found.    Management plans discussed with the patient, family and they are in agreement.  CODE STATUS:     Code Status Orders        Start     Ordered   01/05/17 1556  Full code  Continuous     01/05/17 1555        TOTAL TIME TAKING CARE OF THIS PATIENT: 40 minutes.    Henreitta Leber M.D on 01/07/2017 at 4:13 PM  Between 7am to 6pm - Pager - [phone]  After 6pm go to [email or web address] - Proofreader  [facility]  Office  [phone]  CC: Primary care physician; Patient, No Pcp Per

## 2017-01-07 NOTE — Progress Notes (Signed)
F/U appointment scheduled at the [facility] on May 31. 2018 at 9:20 am. Of note, he cancelled his last appointment on 12/18/16.

## 2017-01-07 NOTE — Progress Notes (Signed)
Patient Name: Gabriel [name]. Date of Encounter: 01/07/2017  Primary Cardiologist: [facility] Problem List     Active Problems:   Unstable angina [facility])     Subjective   Feels much better s/p increase in torsemide and Xanax. Ready to go home. No further chest pain s/p Xanax along with diuresis. UOP 6.1 L for the admission. No weight this AM. No bmet this AM.    Inpatient Medications    Scheduled Meds: . aspirin  81 mg Oral Daily  . enoxaparin (LOVENOX) injection  40 mg Subcutaneous Q24H  . insulin aspart  0-5 Units Subcutaneous QHS  . insulin aspart  0-9 Units Subcutaneous TID WC  . metoprolol succinate  50 mg Oral Daily  . potassium chloride SA  10 mEq Oral Daily  . rosuvastatin  20 mg Oral q1800  . sacubitril-valsartan  1 tablet Oral BID  . sodium chloride flush  3 mL Intravenous Q12H  . spironolactone  25 mg Oral Daily  . torsemide  60 mg Oral Daily   Continuous Infusions: . sodium chloride     PRN Meds: sodium chloride, acetaminophen, ALPRAZolam, morphine injection, nitroGLYCERIN, ondansetron (ZOFRAN) IV, ondansetron, sodium chloride flush   Vital Signs    Vitals:   01/06/17 1146 01/06/17 1720 01/07/17 0348 01/07/17 0736  BP: (!) 159/106 (!) 152/100 140/84 139/87  Pulse: 69 67 60 63  Resp: 14  19 16   Temp: 97.6 F (36.4 C)  97.9 F (36.6 C) 97.8 F (36.6 C)  TempSrc: Oral   Oral  SpO2: 95%  95% 96%  Weight:      Height:        Intake/Output Summary (Last 24 hours) at 01/07/17 0803 Last data filed at 01/07/17 0650  Gross per 24 hour  Intake              240 ml  Output             5650 ml  Net            -5410 ml   Filed Weights   01/05/17 1117 01/05/17 1637 01/06/17 0835  Weight: 253 lb (114.8 kg) 256 lb 12.8 oz (116.5 kg) 256 lb (116.1 kg)    Physical Exam    GEN: Well nourished, well developed, in no acute distress.  HEENT: Grossly normal.  Neck: Supple, no JVD, carotid bruits, or masses. Cardiac: RRR, no murmurs, rubs, or  gallops. No clubbing, cyanosis, edema.  Radials/DP/PT 2+ and equal bilaterally.  Respiratory:  Improved breath sounds bilaterally. GI: Soft, nontender, nondistended, BS + x 4. MS: no deformity or atrophy. Skin: warm and dry, no rash. Neuro:  Strength and sensation are intact. Psych: AAOx3.  Normal affect.  Labs    CBC  Recent Labs  01/05/17 1118 01/06/17 0544  WBC 9.4 9.8  HGB 15.0 14.6  HCT 44.2 43.6  MCV 80.3 80.9  PLT 163 195   Basic Metabolic Panel  Recent Labs  01/05/17 1118 01/06/17 0544  NA 140  --   K 3.6  --   CL 107  --   CO2 26  --   GLUCOSE 142*  --   BUN 18  --   CREATININE 1.20  --   CALCIUM 8.7*  --   MG  --  1.7   Liver Function Tests  Recent Labs  01/05/17 1118  AST 32  ALT 29  ALKPHOS 74  BILITOT 2.0*  PROT 6.7  ALBUMIN 3.6  No results for input(s): LIPASE, AMYLASE in the last 72 hours. Cardiac Enzymes  Recent Labs  01/05/17 1943 01/05/17 2251 01/06/17 0141  TROPONINI 0.06* 0.06* 0.06*   BNP Invalid input(s): POCBNP D-Dimer No results for input(s): DDIMER in the last 72 hours. Hemoglobin A1C  Recent Labs  01/06/17 0141  HGBA1C 6.5*   Fasting Lipid Panel No results for input(s): CHOL, HDL, LDLCALC, TRIG, CHOLHDL, LDLDIRECT in the last 72 hours. Thyroid Function Tests  Recent Labs  01/06/17 0544  TSH 1.357    Telemetry    Sinus rhythm - Personally Reviewed  ECG    n/a - Personally Reviewed  Radiology    Dg Chest 2 View  Result Date: 01/05/2017 CLINICAL DATA:  Pt came to Ed via EMS c/o sob x2days. Reports today started having sharp chest pains in center of chest. Pt has history of copd, asthma, chf. VS stable at this time. Smoker. EXAM: CHEST  2 VIEW COMPARISON:  11/28/2016 FINDINGS: Cardiac silhouette is is mildly enlarged. No mediastinal hilar masses. No evidence of adenopathy. There is irregular thickening of the interstitial markings bilaterally, similar to the prior study. No lung consolidation. No  pleural effusion or pneumothorax. Skeletal structures are intact. IMPRESSION: 1. No convincing acute cardiopulmonary disease. 2. There is irregular interstitial thickening which has been stable from multiple prior studies, presumed chronic. A component of interstitial edema is possible. No evidence of pneumonia. Electronically Signed   By: Lajean Manes M.D.   On: 01/05/2017 11:46   Ct Angio Chest Pe W Or Wo Contrast  Result Date: 01/05/2017 CLINICAL DATA:  past history of diabetes type 2 which improved with weight loss and smoking. Patient reports increasing shortness of breath with exertion for some weeks and then chest pain coming and going for the last 2 days. It began again at 9:00 this morning with shortness of breath and nausea and has continued. It feels like an elephant sitting on his chest. Hx of CHF, EXAM: CT ANGIOGRAPHY CHEST WITH CONTRAST TECHNIQUE: Multidetector CT imaging of the chest was performed using the standard protocol during bolus administration of intravenous contrast. Multiplanar CT image reconstructions and MIPs were obtained to evaluate the vascular anatomy. CONTRAST:  75 mL Isovue 370 IV COMPARISON:  10/28/2016 FINDINGS: Cardiovascular: Satisfactory opacification of pulmonary arteries noted, and there is no evidence of pulmonary emboli. Incomplete opacification of the thoracic aorta. No evidence of aneurysm. Scattered coronary calcifications. No pericardial effusion. Mediastinum/Nodes: 2.2 cm subcarinal adenopathy previously 2 cm. Multiple prominent paraesophageal, prevascular, right paratracheal, precarinal, and AP window lymph nodes which are largely stable. No axillary adenopathy. Lungs/Pleura: Pleural effusions right greater than left, slightly increased. No pneumothorax. Low lung volumes. Small calcified granulomas in both lower lobes. Upper Abdomen: No acute findings Musculoskeletal: No fracture or other worrisome lesions. Review of the MIP images confirms the above findings.  IMPRESSION: 1. Negative for acute PE. 2. Pleural effusions right greater than left slightly increased since prior study. 3. Chronic mediastinal adenopathy, largely stable. 4. Atherosclerosis, including coronary artery disease. Please note that although the presence of coronary artery calcium documents the presence of coronary artery disease, the severity of this disease and any potential stenosis cannot be assessed on this non-gated CT examination. Assessment for potential risk factor modification, dietary therapy or pharmacologic therapy may be warranted, if clinically indicated. Electronically Signed   By: Lucrezia Europe M.D.   On: 01/05/2017 14:58    Cardiac Studies   LHC 01/06/2017: Conclusion     There is moderate to  severe left ventricular systolic dysfunction.  LV end diastolic pressure is moderately elevated.  The left ventricular ejection fraction is 25-35% by visual estimate.  Dist RCA lesion, 40 %stenosed.  Prox RCA to Mid RCA lesion, 40 %stenosed.   1. Mild nonobstructive coronary artery disease. 2. Moderately to severely reduced LV systolic function with an EF of 30-35%. 3. Moderately elevated left ventricular end-diastolic pressure. LVEDP was in the high 20s.  Recommendations: Continue medical therapy for nonobstructive coronary artery disease. The patient has nonischemic cardiomyopathy. He is volume overloaded and I increased torsemide to 40 mg by mouth twice daily. Consider adding spironolactone or eplerenone in the near future.     Patient Profile     46 y.o. male with history of nonischemic cardiomyopathy by nuclear stress test in 1017, chronic systolic CHF, COPD 2/2 ongoing tobacco abuse at 2 ppd since age 77, DM2, medication noncompliance, prior Neisseria meningitis PNA and bacteremia in 12/2015, vasovagal syncope, and obesity who presented to [facility] with chest tightness and SOB.  Assessment & Plan    1. NICM/acute on chronic systolic CHF: -Status post LHC on 5/21  that showed nonobstructive CAD with elevated LVEDP in the high 20s -Diuresed well with torsemide 40 mg bid (previously on 40 mg daily) -Feels much better -Will go home with torsemide 60 mg daily and spironolactone 25 mg daily -Will need follow up bmet in 1 week in our office (message has been sent to staff to schedule this) -Continue increased dose of Entresto 49/51 mg bid and Toprol XL -CHF education -Compliance advised -Check bmet this morning  -Ambulate in the hallway this morning -ASA 81 mg daily  2. NSVT: -No evidence of frequent ventricular ectopy noted on telemetry  -Toprol XL as above -Potassium repleted -Magnesium ok -TSH normal -Consider outpatient EP evaluation  3. Ongoing tobacco abuse: -Cessation is advised  4. Obesity: -Weight loss advised   Signed, Christell Faith, PA-C [facility] Pager: [phone] 01/07/2017, 8:03 AM

## 2017-01-07 NOTE — Discharge Instructions (Signed)
Heart Failure Clinic appointment on Jan 16, 2017 at 9:20am with [name], [place]. Please call [phone] to reschedule.

## 2017-01-14 ENCOUNTER — Other Ambulatory Visit
Admission: RE | Admit: 2017-01-14 | Discharge: 2017-01-14 | Disposition: A | Discharge status: Home / Self Care | Payer: Self-pay | Source: Ambulatory Visit | Attending: Physician Assistant | Admitting: Physician Assistant

## 2017-01-14 ENCOUNTER — Other Ambulatory Visit: Payer: Self-pay

## 2017-01-14 DIAGNOSIS — I5022 Chronic systolic (congestive) heart failure: Secondary | ICD-10-CM | POA: Insufficient documentation

## 2017-01-14 LAB — BASIC METABOLIC PANEL
Anion gap: 11 (ref 5–15)
BUN: 24 mg/dL — ABNORMAL HIGH (ref 6–20)
CO2: 24 mmol/L (ref 22–32)
Calcium: 10 mg/dL (ref 8.9–10.3)
Chloride: 101 mmol/L (ref 101–111)
Creatinine, Ser: 1.34 mg/dL — ABNORMAL HIGH (ref 0.61–1.24)
GFR calc Af Amer: >60 mL/min (ref >60)
GFR calc non Af Amer: >60 mL/min (ref >60)
Glucose, Bld: 337 mg/dL — ABNORMAL HIGH (ref 65–99)
Potassium: 4 mmol/L (ref 3.5–5.1)
Sodium: 136 mmol/L (ref 135–145)

## 2017-01-16 ENCOUNTER — Telehealth: Payer: Self-pay | Admitting: Cardiovascular Disease

## 2017-01-16 ENCOUNTER — Ambulatory Visit: Payer: Self-pay | Attending: Family | Admitting: Family

## 2017-01-16 ENCOUNTER — Encounter: Payer: Self-pay | Admitting: Family

## 2017-01-16 ENCOUNTER — Encounter: Payer: Self-pay | Admitting: *Deleted

## 2017-01-16 VITALS — BP 137/87 | HR 80 | Resp 22 | Ht 72.0 in | Wt 252.4 lb

## 2017-01-16 DIAGNOSIS — I509 Heart failure, unspecified: Secondary | ICD-10-CM | POA: Insufficient documentation

## 2017-01-16 DIAGNOSIS — I5022 Chronic systolic (congestive) heart failure: Secondary | ICD-10-CM

## 2017-01-16 DIAGNOSIS — I429 Cardiomyopathy, unspecified: Secondary | ICD-10-CM | POA: Insufficient documentation

## 2017-01-16 DIAGNOSIS — J449 Chronic obstructive pulmonary disease, unspecified: Secondary | ICD-10-CM | POA: Insufficient documentation

## 2017-01-16 DIAGNOSIS — N183 Chronic kidney disease, stage 3 (moderate): Secondary | ICD-10-CM | POA: Insufficient documentation

## 2017-01-16 DIAGNOSIS — I10 Essential (primary) hypertension: Secondary | ICD-10-CM

## 2017-01-16 DIAGNOSIS — F17210 Nicotine dependence, cigarettes, uncomplicated: Secondary | ICD-10-CM | POA: Insufficient documentation

## 2017-01-16 DIAGNOSIS — I5023 Acute on chronic systolic (congestive) heart failure: Secondary | ICD-10-CM

## 2017-01-16 DIAGNOSIS — I130 Hypertensive heart and chronic kidney disease with heart failure and stage 1 through stage 4 chronic kidney disease, or unspecified chronic kidney disease: Secondary | ICD-10-CM | POA: Insufficient documentation

## 2017-01-16 DIAGNOSIS — E1122 Type 2 diabetes mellitus with diabetic chronic kidney disease: Secondary | ICD-10-CM | POA: Insufficient documentation

## 2017-01-16 DIAGNOSIS — E1159 Type 2 diabetes mellitus with other circulatory complications: Secondary | ICD-10-CM

## 2017-01-16 DIAGNOSIS — I200 Unstable angina: Secondary | ICD-10-CM | POA: Insufficient documentation

## 2017-01-16 DIAGNOSIS — Z720 Tobacco use: Secondary | ICD-10-CM

## 2017-01-16 LAB — GLUCOSE, POCT (MANUAL RESULT ENTRY): POC Glucose: 339 mg/dl — AB (ref 70–99)

## 2017-01-16 MED ORDER — ROSUVASTATIN CALCIUM 20 MG PO TABS: 20.0000 mg | ORAL_TABLET | Freq: Every day | ORAL | 3 refills | Status: DC

## 2017-01-16 MED ORDER — NITROGLYCERIN 0.4 MG SL SUBL: 0.4000 mg | SUBLINGUAL_TABLET | SUBLINGUAL | 3 refills | Status: DC | PRN

## 2017-01-16 NOTE — Addendum Note (Signed)
Addended by: Valora Corporal on: 01/16/2017 08:36 AM   Modules accepted: Orders

## 2017-01-16 NOTE — Telephone Encounter (Signed)
Spoke with patient and reviewed lab results and recommendations. He verbalized understanding of recommendations and agreement with plan. He does have appointment today with [name] NP and confirmed that time. Instructed him to have repeat labs done the week of his birthday and to go to [place] [facility] to have them done. He verbalized understanding of our conversation and had no further questions at this time.

## 2017-01-16 NOTE — Telephone Encounter (Signed)
Pt returning our call for lab results ° °Please call back ° °

## 2017-01-16 NOTE — Progress Notes (Signed)
Patient ID: Gabriel Cortese., male    DOB: Apr 02, 1971, 46 y.o.   MRN: [id number]  HPI  Gabriel [name] is a 46 y/o male with a history of HTN, diabetes, COPD, CKD, asthma, current tobacco use and chronic heart failure.   Reviewed last echo report that was done 11/28/16 and it showed an EF of 40-45% along with mild Gabriel. EF has improved from 30-35% July 2017.  Last admission on 01/05/17 for an NSTEMI. Had elevated troponin and underwent cardiac catheterization. Was discharged home after 2 days on nitroglycerin and Crestor. Was in the ED 12/06/16 for a kidney stone. Evaluated and discharged home. Admitted 11/28/16 with HF exacerbation. Cardiology consult obtained. IV solu-medrol given for COPD exacerbation as well. Discharged home the next day. Admitted 10/28/16 with HF exacerbation. Initially treated with IV diuretics and transitioned to oral diuretics. Discharged home after 2 days. Has had numerous other admissions/ED visits in the last 6 months.  He presents today for a follow-up visit with a chief complaint of chronic chest pain that is radiating to his arms. This is accompanied by anxiousness, chest tightness, and a headache for the past couple of days. He is still not sleeping well.   Past Medical History:  Diagnosis Date  . Cardiomyopathy ([place])   . Chronic systolic dysfunction of left ventricle    presumed nonischemic, though patient denies having prior cath  . CKD (chronic kidney disease), stage III   . COPD (chronic obstructive pulmonary disease) ([place])   . Hypertension    Resolved since weight loss  . Obesity (BMI 30.0-34.9)   . Psoriasis   . Tobacco abuse    Past Surgical History:  Procedure Laterality Date  . AMPUTATION    . FINGER AMPUTATION     Traumatic  . LEFT HEART CATH AND CORONARY ANGIOGRAPHY N/A 01/06/2017   Procedure: Left Heart Cath and Coronary Angiography;  Surgeon: Wellington Hampshire, MD;  Location: [place] [facility];  Service: Cardiovascular;  Laterality: N/A;   Family  History  Problem Relation Age of Onset  . Diabetes Mellitus II Mother   . Hypertension Father   . Cancer Paternal Aunt   . Cancer Maternal Grandfather    Social History  Substance Use Topics  . Smoking status: Current Some Day Smoker    Packs/day: 0.00    Years: 33.00    Types: Cigarettes  . Smokeless tobacco: Never Used  . Alcohol use 1.8 oz/week    3 Cans of beer per week     Comment: occassionally   Allergies  Allergen Reactions  . Prednisone Other (See Comments)    Reaction: Hallucinations    Prior to Admission medications   Medication Sig Start Date End Date Taking? Authorizing Provider  amiodarone (PACERONE) 200 MG tablet Take 1 tablet (200 mg total) by mouth 2 (two) times daily. 09/19/16  Yes Earleen Newport, MD  amLODipine (NORVASC) 10 MG tablet Take 1 tablet (10 mg total) by mouth daily. 09/19/16  Yes Earleen Newport, MD  aspirin 81 MG chewable tablet Chew 1 tablet (81 mg total) by mouth daily. Reported on 02/08/2016 09/19/16  Yes Earleen Newport, MD  HYDROcodone-acetaminophen [facility]) 5-325 MG tablet Take 1 tablet by mouth every 6 (six) hours as needed for severe pain. 12/06/16  Yes Darel Hong, MD  ibuprofen (ADVIL,MOTRIN) 600 MG tablet Take 1 tablet (600 mg total) by mouth every 8 (eight) hours as needed. 12/06/16  Yes Darel Hong, MD  metoprolol succinate (TOPROL-XL) 25 MG  24 hr tablet Take 2 tablets (50 mg total) by mouth daily. Take with or immediately following a meal. 10/30/16  Yes Dustin Flock, MD  ondansetron (ZOFRAN ODT) 4 MG disintegrating tablet Take 1 tablet (4 mg total) by mouth every 8 (eight) hours as needed for nausea or vomiting. 12/06/16  Yes Darel Hong, MD  potassium chloride SA (K-DUR,KLOR-CON) 20 MEQ tablet Take 1 tablets (20 mEq total) by mouth daily. 10/30/16  Yes Dustin Flock, MD  torsemide (DEMADEX) 20 MG tablet Take 2 tablets (40 mg total) by mouth daily. 12/04/16 01/03/17 Yes Alisa Graff, FNP  sacubitril-valsartan (ENTRESTO) 24-26  MG Take 1 tablet by mouth 2 (two) times daily. 12/11/16   Alisa Graff, FNP     Review of Systems  Constitutional: Positive for fatigue. Negative for appetite change.  HENT: Positive for congestion. Negative for postnasal drip and sore throat.   Eyes: Negative.   Respiratory: Positive for cough, chest tightness and shortness of breath. Negative for wheezing.   Cardiovascular: Positive for chest pain (Radiating pain ). Negative for palpitations and leg swelling.  Gastrointestinal: Positive for abdominal distention.  Endocrine: Negative.   Genitourinary: Negative.   Musculoskeletal: Negative for back pain.  Skin: Negative.   Allergic/Immunologic: Negative.   Neurological: Positive for dizziness, light-headedness and headaches (Constant for the past few days ).  Hematological: Negative for adenopathy. Does not bruise/bleed easily.  Psychiatric/Behavioral: Positive for sleep disturbance. Negative for dysphoric mood and suicidal ideas. The patient is nervous/anxious (stress).    Vitals:   01/16/17 0924  BP: 137/87  Pulse: 80  Resp: (!) 22  SpO2: 100%  Weight: 252 lb 6 oz (114.5 kg)  Height: 6' (1.829 m)   Wt Readings from Last 3 Encounters:  01/16/17 252 lb 6 oz (114.5 kg)  01/06/17 256 lb (116.1 kg)  12/11/16 253 lb (114.8 kg)   Lab Results  Component Value Date   CREATININE 1.34 (H) 01/14/2017   CREATININE 1.27 (H) 01/07/2017   CREATININE 1.20 01/05/2017   Physical Exam  Constitutional: He is oriented to person, place, and time. He appears well-developed and well-nourished.  HENT:  Head: Normocephalic and atraumatic.  Neck: Normal range of motion. Neck supple. No JVD present.  Cardiovascular: Normal rate and regular rhythm.   Pulmonary/Chest: Effort normal. He has no wheezes. He has no rales. He exhibits tenderness (Tender to palpate over left chest wall).  Abdominal: Soft. He exhibits distension. There is no tenderness.  Musculoskeletal: He exhibits no edema or  tenderness.  Neurological: He is alert and oriented to person, place, and time.  Skin: Skin is warm and dry.  Psychiatric: Thought content normal. His mood appears anxious. He is agitated.  Nursing note and vitals reviewed.   Assessment & Plan:  1: Chronic heart failure with reduced ejection fraction- - NYHA class II - mildly fluid overloaded today - weigh daily and call for an overnight weight gain of >2 pounds or a weekly weight gain of >5 pounds. Weight down 4 pounds from discharge - not adding salt, but doesn't read nutrition labels at this time. Encouraged him to closely follow a 2000mg  sodium diet - tolerating his torsemide 60mg  daily  - taking potassium 77meq daily -BMP looks good from 01/14/17; potassium 4.0 and GFR > 60 - on Entresto 24/26mg  twice daily  2: HTN- - BP looks good today.  - will not repeat BMP today - continue Entresto 24/26mg  twice daily at this time; may increase next visit if BP can tolerate -  says that he's waiting on [facility] to return his call  3: Unstable angina-  - on aspirin, metoprolol succinate, and Entresto  - will add Crestor at this time  - EKG performed today which was normal  - chest pain is most likely musculoskeletal - nitroglycerin sent to his pharmacy  4: Tobacco use- - continues to smoke 1/2 ppd - complete cessation discussed for 3 minutes with him  5: Diabetes-   - does not check sugar as he does not have a glucometer - last A1c 6.5% on 01/05/17 - Glucose on 01/14/17 was 337 and glucose today was 339 (not fasting)  - will need to get an appointment with [facility] to see about getting a glucometer  Patient did not bring his medications nor a list. Each medication was verbally reviewed with the patient and he was encouraged to bring the bottles to every visit to confirm accuracy of list.  Return in 1 week or sooner for any questions/problems before then.

## 2017-01-16 NOTE — Telephone Encounter (Signed)
-----   Message from Rogelia Mire, NP sent at 01/14/2017  3:00 PM EDT ----- Potassium is stable.  Kidney function is also relatively stable.  F/u bmet in 2 wks to ensure stability of both.

## 2017-01-16 NOTE — Patient Instructions (Signed)
Continue weighing daily and call for an overnight weight gain of > 2 pounds or a weekly weight gain of >5 pounds. 

## 2017-01-17 LAB — GLUCOSE, CAPILLARY: Glucose-Capillary: 339 mg/dL — ABNORMAL HIGH (ref 65–99)

## 2017-01-23 ENCOUNTER — Encounter: Payer: Self-pay | Admitting: Family

## 2017-01-23 ENCOUNTER — Ambulatory Visit: Payer: Self-pay | Attending: Family | Admitting: Family

## 2017-01-23 ENCOUNTER — Other Ambulatory Visit: Payer: Self-pay | Admitting: Family

## 2017-01-23 VITALS — BP 138/97 | HR 86 | Resp 20 | Ht 72.0 in | Wt 254.1 lb

## 2017-01-23 DIAGNOSIS — Z7982 Long term (current) use of aspirin: Secondary | ICD-10-CM | POA: Insufficient documentation

## 2017-01-23 DIAGNOSIS — I429 Cardiomyopathy, unspecified: Secondary | ICD-10-CM | POA: Insufficient documentation

## 2017-01-23 DIAGNOSIS — E669 Obesity, unspecified: Secondary | ICD-10-CM | POA: Insufficient documentation

## 2017-01-23 DIAGNOSIS — R079 Chest pain, unspecified: Secondary | ICD-10-CM | POA: Insufficient documentation

## 2017-01-23 DIAGNOSIS — I214 Non-ST elevation (NSTEMI) myocardial infarction: Secondary | ICD-10-CM | POA: Insufficient documentation

## 2017-01-23 DIAGNOSIS — J441 Chronic obstructive pulmonary disease with (acute) exacerbation: Secondary | ICD-10-CM | POA: Insufficient documentation

## 2017-01-23 DIAGNOSIS — Z809 Family history of malignant neoplasm, unspecified: Secondary | ICD-10-CM | POA: Insufficient documentation

## 2017-01-23 DIAGNOSIS — Z720 Tobacco use: Secondary | ICD-10-CM

## 2017-01-23 DIAGNOSIS — Z79899 Other long term (current) drug therapy: Secondary | ICD-10-CM | POA: Insufficient documentation

## 2017-01-23 DIAGNOSIS — Z833 Family history of diabetes mellitus: Secondary | ICD-10-CM | POA: Insufficient documentation

## 2017-01-23 DIAGNOSIS — F17210 Nicotine dependence, cigarettes, uncomplicated: Secondary | ICD-10-CM | POA: Insufficient documentation

## 2017-01-23 DIAGNOSIS — E1159 Type 2 diabetes mellitus with other circulatory complications: Secondary | ICD-10-CM

## 2017-01-23 DIAGNOSIS — Z888 Allergy status to other drugs, medicaments and biological substances status: Secondary | ICD-10-CM | POA: Insufficient documentation

## 2017-01-23 DIAGNOSIS — I10 Essential (primary) hypertension: Secondary | ICD-10-CM

## 2017-01-23 DIAGNOSIS — Z8249 Family history of ischemic heart disease and other diseases of the circulatory system: Secondary | ICD-10-CM | POA: Insufficient documentation

## 2017-01-23 DIAGNOSIS — Z87442 Personal history of urinary calculi: Secondary | ICD-10-CM | POA: Insufficient documentation

## 2017-01-23 DIAGNOSIS — Z89029 Acquired absence of unspecified finger(s): Secondary | ICD-10-CM | POA: Insufficient documentation

## 2017-01-23 DIAGNOSIS — N183 Chronic kidney disease, stage 3 (moderate): Secondary | ICD-10-CM | POA: Insufficient documentation

## 2017-01-23 DIAGNOSIS — E119 Type 2 diabetes mellitus without complications: Secondary | ICD-10-CM | POA: Insufficient documentation

## 2017-01-23 DIAGNOSIS — I130 Hypertensive heart and chronic kidney disease with heart failure and stage 1 through stage 4 chronic kidney disease, or unspecified chronic kidney disease: Secondary | ICD-10-CM | POA: Insufficient documentation

## 2017-01-23 DIAGNOSIS — I5022 Chronic systolic (congestive) heart failure: Secondary | ICD-10-CM | POA: Insufficient documentation

## 2017-01-23 DIAGNOSIS — R42 Dizziness and giddiness: Secondary | ICD-10-CM | POA: Insufficient documentation

## 2017-01-23 DIAGNOSIS — Z9889 Other specified postprocedural states: Secondary | ICD-10-CM | POA: Insufficient documentation

## 2017-01-23 DIAGNOSIS — R0789 Other chest pain: Secondary | ICD-10-CM

## 2017-01-23 LAB — GLUCOSE, CAPILLARY: Glucose-Capillary: 209 mg/dL — ABNORMAL HIGH (ref 65–99)

## 2017-01-23 MED ORDER — SACUBITRIL-VALSARTAN 49-51 MG PO TABS: 1.0000 | ORAL_TABLET | Freq: Two times a day (BID) | ORAL | 0 refills | Status: DC

## 2017-01-23 NOTE — Patient Instructions (Addendum)
Continue weighing daily and call for an overnight weight gain of > 2 pounds or a weekly weight gain of >5 pounds.  Finish out current dose of entresto. When you finish that dose, begin the samples of entresto 49/51mg  twice daily

## 2017-01-23 NOTE — Progress Notes (Signed)
Patient ID: Gabriel [name]., male    DOB: [birth year], 46 y.o.   MRN: [id number]  HPI  Gabriel [name] is a 46 y/o male with a history of HTN, diabetes, COPD, CKD, asthma, current tobacco use and chronic heart failure.   Reviewed last echo report that was done 11/28/16 and it showed an EF of 40-45% along with mild Gabriel. EF has improved from 30-35% July 2017.  Last admission on 01/05/17 for an NSTEMI. Had elevated troponin and underwent cardiac catheterization. Was discharged home after 2 days on nitroglycerin and Crestor. Was in the ED 12/06/16 for a kidney stone. Evaluated and discharged home. Admitted 11/28/16 with HF exacerbation. Cardiology consult obtained. IV solu-medrol given for COPD exacerbation as well. Discharged home the next day. Admitted 10/28/16 with HF exacerbation. Initially treated with IV diuretics and transitioned to oral diuretics. Discharged home after 2 days. Has had numerous other admissions/ED visits in the last 6 months.  He presents today for a follow-up visit with a chief complaint of moderate shortness of breath with little exertion. He describes this as chronic in nature and has been present for several years with varying severity. He has associated fatigue, chest tightness, cough and light-headedness along with this.    Past Medical History:  Diagnosis Date  . Cardiomyopathy ([place])   . Chronic systolic dysfunction of left ventricle    presumed nonischemic, though patient denies having prior cath  . CKD (chronic kidney disease), stage III   . COPD (chronic obstructive pulmonary disease) ([place])   . Hypertension    Resolved since weight loss  . Obesity (BMI 30.0-34.9)   . Psoriasis   . Tobacco abuse    Past Surgical History:  Procedure Laterality Date  . AMPUTATION    . FINGER AMPUTATION     Traumatic  . LEFT HEART CATH AND CORONARY ANGIOGRAPHY N/A 01/06/2017   Procedure: Left Heart Cath and Coronary Angiography;  Surgeon: Wellington Hampshire, MD;  Location: [facility];  Service: Cardiovascular;  Laterality: N/A;   Family History  Problem Relation Age of Onset  . Diabetes Mellitus II Mother   . Hypertension Father   . Cancer Paternal Aunt   . Cancer Maternal Grandfather    Social History  Substance Use Topics  . Smoking status: Current Some Day Smoker    Packs/day: 0.00    Years: 33.00    Types: Cigarettes  . Smokeless tobacco: Never Used  . Alcohol use 1.8 oz/week    3 Cans of beer per week     Comment: occassionally   Allergies  Allergen Reactions  . Prednisone Other (See Comments)    Reaction: Hallucinations    Prior to Admission medications   Medication Sig Start Date End Date Taking? Authorizing Provider  amiodarone (PACERONE) 200 MG tablet Take 1 tablet (200 mg total) by mouth 2 (two) times daily. 09/19/16  Yes Earleen Newport, MD  amLODipine (NORVASC) 10 MG tablet Take 1 tablet (10 mg total) by mouth daily. 09/19/16  Yes Earleen Newport, MD  aspirin 81 MG chewable tablet Chew 1 tablet (81 mg total) by mouth daily. Reported on 02/08/2016 09/19/16  Yes Earleen Newport, MD  HYDROcodone-acetaminophen [facility]) 5-325 MG tablet Take 1 tablet by mouth every 6 (six) hours as needed for severe pain. 12/06/16  Yes Darel Hong, MD  ibuprofen (ADVIL,MOTRIN) 600 MG tablet Take 1 tablet (600 mg total) by mouth every 8 (eight) hours as needed. 12/06/16  Yes Darel Hong, MD  metoprolol succinate (TOPROL-XL) 25 MG 24 hr tablet Take 2 tablets (50 mg total) by mouth daily. Take with or immediately following a meal. 10/30/16  Yes Dustin Flock, MD  ondansetron (ZOFRAN ODT) 4 MG disintegrating tablet Take 1 tablet (4 mg total) by mouth every 8 (eight) hours as needed for nausea or vomiting. 12/06/16  Yes Darel Hong, MD  potassium chloride SA (K-DUR,KLOR-CON) 20 MEQ tablet Take 1 tablets (20 mEq total) by mouth daily. 10/30/16  Yes Dustin Flock, MD  torsemide (DEMADEX) 20 MG tablet Take 2 tablets (40 mg total) by mouth daily. 12/04/16 01/03/17  Yes Alisa Graff, FNP  sacubitril-valsartan (ENTRESTO) 24-26 MG Take 1 tablet by mouth 2 (two) times daily. 12/11/16   Alisa Graff, FNP     Review of Systems  Constitutional: Positive for fatigue. Negative for appetite change.  HENT: Positive for congestion. Negative for postnasal drip and sore throat.   Eyes: Negative.   Respiratory: Positive for cough, chest tightness and shortness of breath.   Cardiovascular: Negative for palpitations and leg swelling.  Gastrointestinal: Negative for abdominal distention and abdominal pain.  Endocrine: Negative.   Genitourinary: Negative.   Musculoskeletal: Negative for back pain and neck pain.  Skin: Negative.   Allergic/Immunologic: Negative.   Neurological: Positive for light-headedness (last week). Negative for dizziness.  Hematological: Negative for adenopathy. Does not bruise/bleed easily.  Psychiatric/Behavioral: Positive for sleep disturbance. Negative for dysphoric mood and suicidal ideas. The patient is nervous/anxious (stress).    Vitals:   01/23/17 0926  BP: (!) 138/97  Pulse: 86  Resp: 20  SpO2: 100%  Weight: 254 lb 2 oz (115.3 kg)  Height: 6' (1.829 m)   Wt Readings from Last 3 Encounters:  01/23/17 254 lb 2 oz (115.3 kg)  01/16/17 252 lb 6 oz (114.5 kg)  01/06/17 256 lb (116.1 kg)   Lab Results  Component Value Date   CREATININE 1.34 (H) 01/14/2017   CREATININE 1.27 (H) 01/07/2017   CREATININE 1.20 01/05/2017   Physical Exam  Constitutional: He is oriented to person, place, and time. He appears well-developed and well-nourished.  HENT:  Head: Normocephalic and atraumatic.  Neck: Normal range of motion. Neck supple. No JVD present.  Cardiovascular: Normal rate and regular rhythm.   Pulmonary/Chest: Effort normal. He has no wheezes. He has no rales. He exhibits tenderness (Tender to palpate over left chest wall).  Abdominal: Soft. He exhibits no distension. There is no tenderness.  Musculoskeletal: He exhibits no  edema or tenderness.  Neurological: He is alert and oriented to person, place, and time.  Skin: Skin is warm and dry.  Psychiatric: Thought content normal. His mood appears anxious. He is not agitated.  Nursing note and vitals reviewed.   Assessment & Plan:  1: Chronic heart failure with reduced ejection fraction- - NYHA class II - euvolemic - weigh daily and call for an overnight weight gain of >2 pounds or a weekly weight gain of >5 pounds. Weight down 4 pounds from discharge - not adding salt, but doesn't read nutrition labels at this time. Encouraged him to closely follow a 2000mg  sodium diet - tolerating his torsemide 60mg  daily  - taking potassium 6meq daily -BMP looks good from 01/14/17; potassium 4.0 and GFR > 60 - finish out current entresto dose and then begin entresto 49/51mg  twice daily. 73 samples were given to him and he thinks has a couple weeks worth left of the lower dose  2: HTN- - increasing entresto per above once he  finishes the low dose - working on finding a ride to [facility] to get his paperwork filled out and get established  3: Chest pain-  - on aspirin, metoprolol succinate, and Entresto  - will add Crestor at this time  - upper left chest wall remains tender to palpation but less so than at previous visit  4: Tobacco use- - continues to smoke 1/2 ppd - complete cessation discussed for 3 minutes with him  5: Diabetes-   - does not check sugar as he does not have a glucometer - last A1c 6.5% on 01/05/17 - fasting glucose in the office today was 209 - emphasized the importance of coordinating a ride to [facility]  Patient did not bring his medications nor a list. Each medication was verbally reviewed with the patient and he was encouraged to bring the bottles to every visit to confirm accuracy of list.  Return in 6 weeks or sooner for any questions/problems before then.

## 2017-01-28 ENCOUNTER — Emergency Department
Admission: EM | Admit: 2017-01-28 | Discharge: 2017-01-28 | Disposition: A | Discharge status: Home / Self Care | Payer: Self-pay | Attending: Emergency Medicine | Admitting: Emergency Medicine

## 2017-01-28 ENCOUNTER — Emergency Department: Payer: Self-pay

## 2017-01-28 DIAGNOSIS — F17210 Nicotine dependence, cigarettes, uncomplicated: Secondary | ICD-10-CM | POA: Insufficient documentation

## 2017-01-28 DIAGNOSIS — R0602 Shortness of breath: Secondary | ICD-10-CM | POA: Insufficient documentation

## 2017-01-28 DIAGNOSIS — M25511 Pain in right shoulder: Secondary | ICD-10-CM | POA: Insufficient documentation

## 2017-01-28 DIAGNOSIS — R079 Chest pain, unspecified: Secondary | ICD-10-CM | POA: Insufficient documentation

## 2017-01-28 DIAGNOSIS — I429 Cardiomyopathy, unspecified: Secondary | ICD-10-CM | POA: Insufficient documentation

## 2017-01-28 DIAGNOSIS — I5021 Acute systolic (congestive) heart failure: Secondary | ICD-10-CM | POA: Insufficient documentation

## 2017-01-28 DIAGNOSIS — R7989 Other specified abnormal findings of blood chemistry: Secondary | ICD-10-CM | POA: Insufficient documentation

## 2017-01-28 DIAGNOSIS — R778 Other specified abnormalities of plasma proteins: Secondary | ICD-10-CM

## 2017-01-28 DIAGNOSIS — I5022 Chronic systolic (congestive) heart failure: Secondary | ICD-10-CM

## 2017-01-28 DIAGNOSIS — I110 Hypertensive heart disease with heart failure: Secondary | ICD-10-CM | POA: Insufficient documentation

## 2017-01-28 DIAGNOSIS — R109 Unspecified abdominal pain: Secondary | ICD-10-CM

## 2017-01-28 DIAGNOSIS — E119 Type 2 diabetes mellitus without complications: Secondary | ICD-10-CM | POA: Insufficient documentation

## 2017-01-28 DIAGNOSIS — I2510 Atherosclerotic heart disease of native coronary artery without angina pectoris: Secondary | ICD-10-CM | POA: Insufficient documentation

## 2017-01-28 DIAGNOSIS — N183 Chronic kidney disease, stage 3 (moderate): Secondary | ICD-10-CM | POA: Insufficient documentation

## 2017-01-28 DIAGNOSIS — R1012 Left upper quadrant pain: Secondary | ICD-10-CM | POA: Insufficient documentation

## 2017-01-28 DIAGNOSIS — I428 Other cardiomyopathies: Secondary | ICD-10-CM

## 2017-01-28 HISTORY — DX: Atherosclerotic heart disease of native coronary artery without angina pectoris: I25.10

## 2017-01-28 HISTORY — DX: Ventricular tachycardia: I47.2

## 2017-01-28 HISTORY — DX: Other cardiomyopathies: I42.8

## 2017-01-28 HISTORY — DX: Other ventricular tachycardia: I47.29

## 2017-01-28 HISTORY — DX: Other specified abnormal findings of blood chemistry: R79.89

## 2017-01-28 HISTORY — DX: Other chronic pain: G89.29

## 2017-01-28 HISTORY — DX: Syncope and collapse: R55

## 2017-01-28 HISTORY — DX: Other specified abnormalities of plasma proteins: R77.8

## 2017-01-28 HISTORY — DX: Other chest pain: R07.89

## 2017-01-28 HISTORY — DX: Chronic systolic (congestive) heart failure: I50.22

## 2017-01-28 LAB — CBC
HCT: 51 % (ref 40.0–52.0)
Hemoglobin: 17.6 g/dL (ref 13.0–18.0)
MCH: 27.6 pg (ref 26.0–34.0)
MCHC: 34.5 g/dL (ref 32.0–36.0)
MCV: 80.1 fL (ref 80.0–100.0)
Platelets: 164 10*3/uL (ref 150–440)
RBC: 6.36 MIL/uL — ABNORMAL HIGH (ref 4.40–5.90)
RDW: 16.3 % — ABNORMAL HIGH (ref 11.5–14.5)
WBC: 8.9 10*3/uL (ref 3.8–10.6)

## 2017-01-28 LAB — BASIC METABOLIC PANEL
Anion gap: 8 (ref 5–15)
BUN: 26 mg/dL — ABNORMAL HIGH (ref 6–20)
CO2: 26 mmol/L (ref 22–32)
Calcium: 9.6 mg/dL (ref 8.9–10.3)
Chloride: 101 mmol/L (ref 101–111)
Creatinine, Ser: 1.3 mg/dL — ABNORMAL HIGH (ref 0.61–1.24)
GFR calc Af Amer: >60 mL/min (ref >60)
GFR calc non Af Amer: >60 mL/min (ref >60)
Glucose, Bld: 239 mg/dL — ABNORMAL HIGH (ref 65–99)
Potassium: 4.5 mmol/L (ref 3.5–5.1)
Sodium: 135 mmol/L (ref 135–145)

## 2017-01-28 LAB — TYPE AND SCREEN
ABO/RH(D): AB POS
Antibody Screen: NEGATIVE

## 2017-01-28 LAB — HEPATIC FUNCTION PANEL
ALT: 33 U/L (ref 17–63)
AST: 35 U/L (ref 15–41)
Albumin: 3.9 g/dL (ref 3.5–5.0)
Alkaline Phosphatase: 78 U/L (ref 38–126)
Bilirubin, Direct: 0.2 mg/dL (ref 0.1–0.5)
Indirect Bilirubin: 1.5 mg/dL — ABNORMAL HIGH (ref 0.3–0.9)
Total Bilirubin: 1.7 mg/dL — ABNORMAL HIGH (ref 0.3–1.2)
Total Protein: 7.6 g/dL (ref 6.5–8.1)

## 2017-01-28 LAB — URINE DRUG SCREEN, QUALITATIVE (ARMC ONLY)
Amphetamines, Ur Screen: NOT DETECTED
Barbiturates, Ur Screen: NOT DETECTED
Benzodiazepine, Ur Scrn: NOT DETECTED
Cannabinoid 50 Ng, Ur ~~LOC~~: NOT DETECTED
Cocaine Metabolite,Ur ~~LOC~~: NOT DETECTED
MDMA (Ecstasy)Ur Screen: NOT DETECTED
Methadone Scn, Ur: NOT DETECTED
Opiate, Ur Screen: NOT DETECTED
Phencyclidine (PCP) Ur S: NOT DETECTED
Tricyclic, Ur Screen: NOT DETECTED

## 2017-01-28 LAB — BRAIN NATRIURETIC PEPTIDE: B Natriuretic Peptide: 51 pg/mL (ref 0.0–100.0)

## 2017-01-28 LAB — PROTIME-INR
INR: 0.99
Prothrombin Time: 13.1 seconds (ref 11.4–15.2)

## 2017-01-28 LAB — TROPONIN I
Troponin I: 0.04 ng/mL (ref <0.03)
Troponin I: 0.05 ng/mL (ref <0.03)

## 2017-01-28 LAB — LIPASE, BLOOD: Lipase: 54 U/L — ABNORMAL HIGH (ref 11–51)

## 2017-01-28 LAB — APTT: aPTT: 26 seconds (ref 24–36)

## 2017-01-28 MED ORDER — HYDROCODONE-ACETAMINOPHEN 5-325 MG PO TABS
1.0000 | ORAL_TABLET | Freq: Once | ORAL | Status: AC
  Administered 2017-01-28: 1 via ORAL
  Filled 2017-01-28: qty 1

## 2017-01-28 MED ORDER — ASPIRIN 81 MG PO CHEW
324.0000 mg | CHEWABLE_TABLET | Freq: Once | ORAL | Status: AC
  Administered 2017-01-28: 324 mg via ORAL
  Filled 2017-01-28: qty 4

## 2017-01-28 MED ORDER — IOPAMIDOL (ISOVUE-370) INJECTION 76%
125.0000 mL | Freq: Once | INTRAVENOUS | Status: AC | PRN
  Administered 2017-01-28: 125 mL via INTRAVENOUS

## 2017-01-28 MED ORDER — FENTANYL CITRATE (PF) 100 MCG/2ML IJ SOLN: 75.0000 ug | INTRAMUSCULAR | Status: DC | PRN

## 2017-01-28 MED ORDER — HYDROMORPHONE HCL 1 MG/ML IJ SOLN
1.0000 mg | Freq: Once | INTRAMUSCULAR | Status: AC
  Administered 2017-01-28: 1 mg via INTRAVENOUS

## 2017-01-28 MED ORDER — HYDROMORPHONE HCL 1 MG/ML IJ SOLN
INTRAMUSCULAR | Status: AC
  Administered 2017-01-28: 1 mg via INTRAVENOUS
  Filled 2017-01-28: qty 1

## 2017-01-28 MED ORDER — HYDROCODONE-ACETAMINOPHEN 5-325 MG PO TABS: 1.0000 | ORAL_TABLET | ORAL | 0 refills | Status: DC | PRN

## 2017-01-28 MED ORDER — SODIUM CHLORIDE 0.9 % IV BOLUS (SEPSIS)
1000.0000 mL | Freq: Once | INTRAVENOUS | Status: AC
  Administered 2017-01-28: 1000 mL via INTRAVENOUS

## 2017-01-28 MED ORDER — FENTANYL CITRATE (PF) 100 MCG/2ML IJ SOLN
75.0000 ug | Freq: Once | INTRAMUSCULAR | Status: AC
  Administered 2017-01-28: 75 ug via INTRAVENOUS
  Filled 2017-01-28: qty 2

## 2017-01-28 MED ORDER — KETOROLAC TROMETHAMINE 30 MG/ML IJ SOLN
15.0000 mg | Freq: Once | INTRAMUSCULAR | Status: AC
  Administered 2017-01-28: 15 mg via INTRAVENOUS
  Filled 2017-01-28: qty 1

## 2017-01-28 MED ORDER — ONDANSETRON HCL 4 MG/2ML IJ SOLN
4.0000 mg | Freq: Once | INTRAMUSCULAR | Status: AC
  Administered 2017-01-28: 4 mg via INTRAVENOUS
  Filled 2017-01-28: qty 2

## 2017-01-28 MED ORDER — LIDOCAINE 5 % EX PTCH: 1.0000 | MEDICATED_PATCH | Freq: Two times a day (BID) | CUTANEOUS | 0 refills | Status: DC

## 2017-01-28 NOTE — Consult Note (Signed)
Cardiology Consult    Patient ID: Gabriel [name]. MRN: [id number], DOB/AGE: 1971-08-07   Admit date: 01/28/2017 Date of Consult: 01/28/2017  Primary Physician: Patient, No Pcp Per Primary Cardiologist: Johnny Bridge, MD  Requesting Provider: Eula Listen, MD  Patient Profile    Gabriel [name]. is a 46 y.o. male with a history of chronic chest pain, chronically elevated troponin, NICM, HTN, tob abuse, remote drug use, COPD, CKD III, syncope, nsvt, and non-obstructive CAD, who is being seen today for the evaluation of chest pain and elevated troponin at the request of Dr. Mariea Clonts.  Past Medical History   Past Medical History:  Diagnosis Date  . Chest wall pain, chronic   . Chronic systolic CHF (congestive heart failure) ([place])    a. 03/2015 Echo: EF 45-50%; b. 12/2015 Echo: EF 20-25%; c. 02/2016 Echo: EF 30-35%; d. 11/2016 Echo: EF 40-45%.  . Chronic Troponin Elevation   . CKD (chronic kidney disease), stage III   . COPD (chronic obstructive pulmonary disease) ([place])   . Hypertension    Resolved since weight loss  . NICM (nonischemic cardiomyopathy) ([place])    a. 03/2015 Echo: EF 45-50%; b. 04/2015 MV: small defect of mild severity in apex 2/2 apical thinning, EF 30-44%;  c. 12/2015 Echo: EF 20-25%; d. 02/2016 Echo: EF 30-35%; e. 11/2016 Echo: EF 40-45%, diff HK, mildly dil Ao root, mild MR, mod dil LA;  f. 12/2016 Cath: LM nl, LAD/Diags/LCX/OMs min irregs, RCA 40p/m/d.  . Non-obstructive CAD (coronary artery disease)    a. 04/2015 low risk MV;  b. 12/2016 Cath: minor irregs in LAD/Diag/LCX/OM, RCA 40p/m/d.  [name] (nonsustained ventricular tachycardia) ([place])    a. 12/2015 noted on tele-->amio;  b. 12/2015 Event monitor: no VT. Atach noted.  . Obesity (BMI 30.0-34.9)   . Psoriasis   . Syncope    a. 01/2016 - felt to be vasovagal.  . Tobacco abuse     Past Surgical History:  Procedure Laterality Date  . AMPUTATION    . FINGER AMPUTATION     Traumatic  . LEFT HEART CATH AND CORONARY  ANGIOGRAPHY N/A 01/06/2017   Procedure: Left Heart Cath and Coronary Angiography;  Surgeon: Wellington Hampshire, MD;  Location: [place] [facility];  Service: Cardiovascular;  Laterality: N/A;    Allergies  Allergies  Allergen Reactions  . Prednisone Other (See Comments)    Reaction: Hallucinations    History of Present Illness    46 y/o ? with the above complex PMH including NICM (most recent EF 40-45% by echo 11/2016), non-obs CAD (cath 12/2016), HTN, tob abuse, COPD, obesity, syncope (felt to be vasovagal - 01/2016), NSVT (12/2015  amio started), CKD III, chronic chest wall pain, and chronically elevated troponin.  He has had multiple hospitalizations for both chest pain and dyspnea dating back to at least 03/2015.  In that setting, he has had mild, persistent troponin elevations.  He had a low risk MV in 04/2015 and more recently, underwent cath in 12/2016, revealing nonobs CAD.  This was performed in the setting of ongoing chest pain.  Since his last admission in May, he has continued to have persistent chest and chest wall pain.  The location of this pain moves around his upper chest but it is generally sharp in nature and reproducible with palpation, cough, deep breathing, and position changes.  He also has some degree of chronic DOE, chronic productive cough (yellow sputum), pnd, orthopnea, occasional nausea, and lightheadedness.  He denies  syncope or edema.  He [facility] ED today due to ongoing chest pain.  ECG is non-acute.  Troponin is again elevated @ 0.04.  We have been asked to eval.  He says that the only thing that seems to help the pain is morphine through his IV.  Home Medications    Prior to Admission medications   Medication Sig Start Date End Date Taking? Authorizing Provider  amiodarone (PACERONE) 200 MG tablet Take 1 tablet (200 mg total) by mouth 2 (two) times daily. 01/07/17  Yes Henreitta Leber, MD  amLODipine (NORVASC) 10 MG tablet Take 1 tablet (10 mg total) by  mouth daily. 01/07/17  Yes Henreitta Leber, MD  aspirin 81 MG chewable tablet Chew 1 tablet (81 mg total) by mouth daily. Reported on 02/08/2016 09/19/16  Yes Earleen Newport, MD  ibuprofen (ADVIL,MOTRIN) 600 MG tablet Take 1 tablet (600 mg total) by mouth every 8 (eight) hours as needed. 12/06/16  Yes Darel Hong, MD  metoprolol succinate (TOPROL-XL) 25 MG 24 hr tablet Take 2 tablets (50 mg total) by mouth daily. Take with or immediately following a meal. 01/07/17  Yes Sainani, Belia Heman, MD  nitroGLYCERIN (NITROSTAT) 0.4 MG SL tablet Place 1 tablet (0.4 mg total) under the tongue every 5 (five) minutes as needed for chest pain. 01/16/17 04/16/17 Yes [name] A, FNP  ondansetron (ZOFRAN ODT) 4 MG disintegrating tablet Take 1 tablet (4 mg total) by mouth every 8 (eight) hours as needed for nausea or vomiting. 12/06/16  Yes Darel Hong, MD  potassium chloride SA (K-DUR,KLOR-CON) 20 MEQ tablet Take 1 tablet (20 mEq total) by mouth daily. 01/07/17  Yes Henreitta Leber, MD  rosuvastatin (CRESTOR) 20 MG tablet Take 1 tablet (20 mg total) by mouth daily. 01/16/17 04/16/17 Yes Hackney, Tina A, FNP  sacubitril-valsartan (ENTRESTO) 49-51 MG Take 1 tablet by mouth 2 (two) times daily. 01/23/17  Yes [name] A, FNP  spironolactone (ALDACTONE) 25 MG tablet Take 1 tablet (25 mg total) by mouth daily. 01/08/17  Yes Henreitta Leber, MD  torsemide (DEMADEX) 20 MG tablet Take 3 tablets (60 mg total) by mouth daily. 01/08/17  Yes Henreitta Leber, MD   Family History    Family History  Problem Relation Age of Onset  . Diabetes Mellitus II Mother   . Hypertension Father   . Cancer Paternal Aunt   . Cancer Maternal Grandfather     Social History    Social History   Social History  . Marital status: Widowed    Spouse name: N/A  . Number of children: N/A  . Years of education: N/A   Occupational History  . Not on file.   Social History Main Topics  . Smoking status: Current Some Day Smoker     Packs/day: 0.00    Years: 33.00    Types: Cigarettes  . Smokeless tobacco: Never Used     Comment: currently smoking a few cigs/day.  . Alcohol use 1.8 oz/week    3 Cans of beer per week     Comment: occassionally  . Drug use: No  . Sexual activity: Not on file   Other Topics Concern  . Not on file   Social History Narrative  . No narrative on file     Review of Systems    General:  No chills, fever, night sweats or weight changes.  Cardiovascular:  +++ chest pain, +++ dyspnea on exertion, no edema, +++ orthopnea, +++ palpitations, +++  paroxysmal nocturnal dyspnea. Dermatological: No rash, lesions/masses Respiratory: +++ cough, +++ dyspnea Urologic: No hematuria, dysuria Abdominal:  +++ nausea, no vomiting, diarrhea, bright red blood per rectum, melena, or hematemesis Neurologic:  No visual changes, wkns, changes in mental status. All other systems reviewed and are otherwise negative except as noted above.  Physical Exam    Blood pressure 138/87, pulse 77, temperature 97.7 F (36.5 C), temperature source Oral, resp. rate (!) 34, height 6' (1.829 m), weight 254 lb (115.2 kg), SpO2 93 %.  General: Pleasant, NAD Psych: Normal affect. Neuro: Alert and oriented X 3. Moves all extremities spontaneously. HEENT: Normal  Neck: Supple without bruits or JVD. Lungs:  Resp regular and unlabored, CTA. Heart: RRR no s3, s4, or murmurs. Abdomen: Soft, non-tender, non-distended, BS + x 4.  Extremities: No clubbing, cyanosis or edema. DP/PT/Radials 2+ and equal bilaterally.  Labs     Recent Labs  01/28/17 1310  TROPONINI 0.04*   Lab Results  Component Value Date   WBC 8.9 01/28/2017   HGB 17.6 01/28/2017   HCT 51.0 01/28/2017   MCV 80.1 01/28/2017   PLT 164 01/28/2017     Recent Labs Lab 01/28/17 1310 01/28/17 1453  NA 135  --   K 4.5  --   CL 101  --   CO2 26  --   BUN 26*  --   CREATININE 1.30*  --   CALCIUM 9.6  --   PROT  --  7.6  BILITOT  --  1.7*  ALKPHOS   --  78  ALT  --  33  AST  --  35  GLUCOSE 239*  --    Lab Results  Component Value Date   CHOL 74 01/04/2016   HDL 14 (L) 01/04/2016   LDLCALC 46 01/04/2016   TRIG 71 01/04/2016    Radiology Studies    Dg Chest 2 View  Result Date: 01/28/2017 CLINICAL DATA:  Shortness of breath with chest pain EXAM: CHEST  2 VIEW COMPARISON:  01/05/2017 FINDINGS: Cardiac shadow is mildly enlarged but stable. Lungs are well aerated bilaterally. Previously seen pleural effusions have resolved in the interval. Calcified granuloma is again noted in the right lung base. No acute bony abnormality is noted. IMPRESSION: No active cardiopulmonary disease. Electronically Signed   By: Inez Catalina M.D.   On: 01/28/2017 13:51   Dg Chest 2 View  Result Date: 01/05/2017 CLINICAL DATA:  Pt came to Ed via EMS c/o sob x2days. Reports today started having sharp chest pains in center of chest. Pt has history of copd, asthma, chf. VS stable at this time. Smoker. EXAM: CHEST  2 VIEW COMPARISON:  11/28/2016 FINDINGS: Cardiac silhouette is is mildly enlarged. No mediastinal hilar masses. No evidence of adenopathy. There is irregular thickening of the interstitial markings bilaterally, similar to the prior study. No lung consolidation. No pleural effusion or pneumothorax. Skeletal structures are intact. IMPRESSION: 1. No convincing acute cardiopulmonary disease. 2. There is irregular interstitial thickening which has been stable from multiple prior studies, presumed chronic. A component of interstitial edema is possible. No evidence of pneumonia. Electronically Signed   By: Lajean Manes M.D.   On: 01/05/2017 11:46   Ct Angio Chest Pe W Or Wo Contrast  Result Date: 01/05/2017 CLINICAL DATA:  past history of diabetes type 2 which improved with weight loss and smoking. Patient reports increasing shortness of breath with exertion for some weeks and then chest pain coming and going for the last 2 days.  It began again at 9:00 this  morning with shortness of breath and nausea and has continued. It feels like an elephant sitting on his chest. Hx of CHF, EXAM: CT ANGIOGRAPHY CHEST WITH CONTRAST TECHNIQUE: Multidetector CT imaging of the chest was performed using the standard protocol during bolus administration of intravenous contrast. Multiplanar CT image reconstructions and MIPs were obtained to evaluate the vascular anatomy. CONTRAST:  75 mL Isovue 370 IV COMPARISON:  10/28/2016 FINDINGS: Cardiovascular: Satisfactory opacification of pulmonary arteries noted, and there is no evidence of pulmonary emboli. Incomplete opacification of the thoracic aorta. No evidence of aneurysm. Scattered coronary calcifications. No pericardial effusion. Mediastinum/Nodes: 2.2 cm subcarinal adenopathy previously 2 cm. Multiple prominent paraesophageal, prevascular, right paratracheal, precarinal, and AP window lymph nodes which are largely stable. No axillary adenopathy. Lungs/Pleura: Pleural effusions right greater than left, slightly increased. No pneumothorax. Low lung volumes. Small calcified granulomas in both lower lobes. Upper Abdomen: No acute findings Musculoskeletal: No fracture or other worrisome lesions. Review of the MIP images confirms the above findings. IMPRESSION: 1. Negative for acute PE. 2. Pleural effusions right greater than left slightly increased since prior study. 3. Chronic mediastinal adenopathy, largely stable. 4. Atherosclerosis, including coronary artery disease. Please note that although the presence of coronary artery calcium documents the presence of coronary artery disease, the severity of this disease and any potential stenosis cannot be assessed on this non-gated CT examination. Assessment for potential risk factor modification, dietary therapy or pharmacologic therapy may be warranted, if clinically indicated. Electronically Signed   By: Lucrezia Europe M.D.   On: 01/05/2017 14:58   Ct Angio Chest/abd/pel For Dissection W And/or  W/wo  Result Date: 01/28/2017 CLINICAL DATA:  Chest pain, shortness of breath. EXAM: CT ANGIOGRAPHY CHEST, ABDOMEN AND PELVIS TECHNIQUE: Multidetector CT imaging through the chest, abdomen and pelvis was performed using the standard protocol during bolus administration of intravenous contrast. Multiplanar reconstructed images and MIPs were obtained and reviewed to evaluate the vascular anatomy. CONTRAST:  125 mL of Isovue 370 intravenously. COMPARISON:  CT scan of chest of Jan 05, 2017. CT scan of abdomen and pelvis of December 06, 2016. FINDINGS: CTA CHEST FINDINGS Cardiovascular: There is no evidence of thoracic aortic dissection or aneurysm. Great vessels are widely patent. No pericardial effusion is noted. Mediastinum/Nodes: Mild mediastinal adenopathy is noted which is improved compared to prior exam, most consistent with improving inflammatory or reactive adenopathy. Small left thyroid cyst is noted. Lungs/Pleura: Lungs are clear. No pleural effusion or pneumothorax. Musculoskeletal: No chest wall abnormality. No acute or significant osseous findings. Review of the MIP images confirms the above findings. CTA ABDOMEN AND PELVIS FINDINGS VASCULAR Aorta: Normal caliber aorta without aneurysm, dissection, vasculitis or significant stenosis. Mild atherosclerotic calcifications are seen in the infrarenal abdominal aorta. Celiac: Patent without evidence of aneurysm, dissection, vasculitis or significant stenosis. SMA: Patent without evidence of aneurysm, dissection, vasculitis or significant stenosis. Renals: Both renal arteries are patent without evidence of aneurysm, dissection, vasculitis, fibromuscular dysplasia or significant stenosis. IMA: Patent without evidence of aneurysm, dissection, vasculitis or significant stenosis. Inflow: Patent without evidence of aneurysm, dissection, vasculitis or significant stenosis. Veins: No obvious venous abnormality within the limitations of this arterial phase study. Review of  the MIP images confirms the above findings. NON-VASCULAR Hepatobiliary: No focal liver abnormality is seen. No gallstones, gallbladder wall thickening, or biliary dilatation. Pancreas: Unremarkable. No pancreatic ductal dilatation or surrounding inflammatory changes. Spleen: Normal in size without focal abnormality. Adrenals/Urinary Tract: Adrenal glands are unremarkable. Small nonobstructive  left renal calculus is noted. No hydronephrosis or renal obstruction is noted. Urinary bladder is unremarkable. Stomach/Bowel: Stomach is within normal limits. Appendix appears normal. No evidence of bowel wall thickening, distention, or inflammatory changes. Lymphatic: No adenopathy is noted. Reproductive: Prostate is unremarkable. Other: No abdominal wall hernia or abnormality. No abdominopelvic ascites. Musculoskeletal: No acute or significant osseous findings. Review of the MIP images confirms the above findings. IMPRESSION: No evidence of thoracic or abdominal aortic dissection or aneurysm. Mild atherosclerotic calcifications are seen in infrarenal abdominal aorta suggesting early atheromatous disease. Mild mediastinal adenopathy is noted which is decreased compared to prior exam, most consistent with improving inflammatory or reactive adenopathy. Small nonobstructive left renal calculus. No hydronephrosis or renal obstruction is noted. Electronically Signed   By: Marijo Conception, M.D.   On: 01/28/2017 15:04    ECG & Cardiac Imaging    Rsr, 82, lae, ant infarct, no acute changes.  Assessment & Plan    1.  Chronic chest chest wall pain/Chronic troponin elevation/nonobstructive CAD:  Pt with a 2 yr h/o intermittent chest pain with multiple admissions, low risk MV in 04/2015 and more recently nonobs CAD by cath in 12/2016.  He presents again with chest wall pain that is easily reproducible with palpation, position changes, deep breathing, and coughing.  Pain is sharp in nature.  Troponin is once again elevated @ 0.04.   Of note, he has never had a normal troponin I in the [facility] system dating back to 03/2015.  ECG non-acute.  CTA neg for PE.  Current symptoms are atypical for angina and troponin elevation is nonspecific in this case.  We would not pursue any further ischemic evaluation.  Given chronic pain, he would benefit from outpatient pain clinic.  2.  NICM/Chronic systolic CHF:  EF 35-45% by most recent echo in April.  He is euvolemic on exam and reports compliance with home meds.  Cont  blocker, ARNI, spiro, and torsemide.  F/u in CHF clinic.  3.  Hypertensive Heart Dzs:  BP relatively stable.  Cont home meds.  4.  NSVT:  Noted on tele in 12/2015.  He has been on amio @ home.  Cont  blocker.  Given youth, would prefer to limit his exposure to [facility] over time.  F/u Johnny Bridge, MD as outpt.  5.  CKD III:  Creat stable.  6.  Tob Abuse:  Stills smoking a few cigarettes/day.  Cessation advised.  Signed, Murray Hodgkins, NP 01/28/2017, 4:48 PM

## 2017-01-28 NOTE — ED Notes (Signed)
Pt on monitor 

## 2017-01-28 NOTE — ED Notes (Signed)
Upon entering room. Patient is screaming out in pain with back arched. Dr. Mariea Clonts at bedside.

## 2017-01-28 NOTE — ED Notes (Signed)
Pt taken to CT via strethcer

## 2017-01-28 NOTE — ED Provider Notes (Signed)
[facility] Emergency Department Provider Note  ____________________________________________  Time seen: Approximately 2:22 PM  I have reviewed the triage vital signs and the nursing notes.   HISTORY  Chief Complaint Chest Pain    HPI Gabriel Sherk. is a 46 y.o. male with a history of CHF and cardiomyopathy, COPD, HTN, renal colic, presenting with chest pain. The patient reports that this morning he wasexperiencing a sharp chest pain in the chest that did not radiate associated with shortness of breath. No lightheadedness or syncope, no palpitations. No recent cough or cold symptoms. The patient was admitted to the hospital 01/05/2017 with an STEMI. Upon arrival here, the patient developed a severe left upper quadrant and flank pain, which is a new pain for him. Positional changes and holding his side did not improve his pain.   Past Medical History:  Diagnosis Date  . Cardiomyopathy ([place])   . CHF (congestive heart failure) ([place])   . Chronic systolic dysfunction of left ventricle    presumed nonischemic, though patient denies having prior cath  . CKD (chronic kidney disease), stage III   . COPD (chronic obstructive pulmonary disease) ([place])   . Hypertension    Resolved since weight loss  . Obesity (BMI 30.0-34.9)   . Psoriasis   . Tobacco abuse     Patient Active Problem List   Diagnosis Date Noted  . Unstable angina ([place]) 01/05/2017  . Tobacco use 12/04/2016  . Gallbladder sludge 12/04/2016  . HCAP (healthcare-associated pneumonia) 03/15/2016  . Coronary artery disease, non-occlusive 03/15/2016  . Chest pain 02/16/2016  . Orthostatic hypotension 01/23/2016  . Diarrhea 01/23/2016  . Hypokalemia 01/23/2016  . Hypomagnesemia 01/23/2016  . Syncope 01/22/2016  . 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 NSVT (nonsustained ventricular tachycardia) ([place])   . 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 CAP (community acquired  pneumonia)   . Arterial hypotension   . Elevated troponin   . Chronic systolic heart failure ([place])   . Sepsis ([place]) 01/03/2016  . Type II diabetes mellitus ([place]) 05/17/2015  . Hyperlipidemia 05/17/2015  . COPD (chronic obstructive pulmonary disease) ([place]) 05/17/2015  . HTN (hypertension) 03/31/2015    Past Surgical History:  Procedure Laterality Date  . AMPUTATION    . FINGER AMPUTATION     Traumatic  . LEFT HEART CATH AND CORONARY ANGIOGRAPHY N/A 01/06/2017   Procedure: Left Heart Cath and Coronary Angiography;  Surgeon: Wellington Hampshire, MD;  Location: [place] [facility];  Service: Cardiovascular;  Laterality: N/A;    Current Outpatient Rx  . Order #: [id number] Class: Print  . Order #: [id number] Class: Print  . Order #: [id number] Class: Print  . Order #: [id number] Class: Print  . Order #: [id number] Class: Print  . Order #: [id number] Class: Normal  . Order #: [id number] Class: Print  . Order #: [id number] Class: Print  . Order #: [id number] Class: Normal  . Order #: [id number] Class: Sample  . Order #: [id number] Class: Print  . Order #: [id number] Class: Print    Allergies Prednisone  Family History  Problem Relation Age of Onset  . Diabetes Mellitus II Mother   . Hypertension Father   . Cancer Paternal Aunt   . Cancer Maternal Grandfather     Social History Social History  Substance Use Topics  . Smoking status: Current Some Day Smoker    Packs/day: 0.00    Years: 33.00    Types: Cigarettes  . Smokeless tobacco: Never Used  . Alcohol use 1.8 oz/week    3  Cans of beer per week     Comment: occassionally    Review of Systems Constitutional: No fever/chills.Lightheadedness or syncope. Eyes: No visual changes. ENT: No sore throat. No congestion or rhinorrhea. Cardiovascular: Positive chest pain. Denies palpitations. Respiratory: Positive shortness of breath.  No cough. Gastrointestinal: Positive left upper quadrant and flank pain.  No nausea, no vomiting.  No diarrhea.   No constipation. Genitourinary: Negative for dysuria. Musculoskeletal: Negative for back pain. Skin: Negative for rash. Neurological: Negative for headaches. No focal numbness, tingling or weakness.     ____________________________________________   PHYSICAL EXAM:  VITAL SIGNS: ED Triage Vitals  Enc Vitals Group     BP 01/28/17 1314 (!) 158/91     Pulse Rate 01/28/17 1314 85     Resp 01/28/17 1314 (!) 22     Temp 01/28/17 1314 97.7 F (36.5 C)     Temp Source 01/28/17 1314 Oral     SpO2 01/28/17 1310 98 %     Weight 01/28/17 1311 254 lb (115.2 kg)     Height 01/28/17 1311 6' (1.829 m)     Head Circumference --      Peak Flow --      Pain Score 01/28/17 1310 10     Pain Loc --      Pain Edu? --      Excl. in [place]? --     Constitutional: The patient is alert and oriented and answering questions appropriately. When I walk in the room, he is clutching his left upper quadrant and having difficulty remaining still for my examination. He is mentating normally Eyes: Conjunctivae are normal.  EOMI. No scleral icterus. Head: Atraumatic. Nose: No congestion/rhinnorhea. Mouth/Throat: Mucous membranes are moist.  Neck: No stridor.  Supple.  Positive JVD. No meningismus. Cardiovascular: Normal rate, regular rhythm. No murmurs, rubs or gallops.  Respiratory: Normal respiratory effort.  No accessory muscle use or retractions. Lungs CTAB.  No wheezes, rales or ronchi. Gastrointestinal: Soft, and nondistended.  Tender to palpation diffusely in the left side, equal in the upper and lower quadrant. No guarding or rebound.  No peritoneal signs. Musculoskeletal: No LE edema. No ttp in the calves or palpable cords.  Negative Homan's sign. Neurologic:  A&Ox3.  Speech is clear.  Face and smile are symmetric.  EOMI.  Moves all extremities well. Skin:  Skin is warm, dry and intact. No rash noted. Psychiatric: Mood and affect are normal. Speech and behavior are normal.  Normal  judgement.  ____________________________________________   LABS (all labs ordered are listed, but only abnormal results are displayed)  Labs Reviewed  BASIC METABOLIC PANEL - Abnormal; Notable for the following:       Result Value   Glucose, Bld 239 (*)    BUN 26 (*)    Creatinine, Ser 1.30 (*)    All other components within normal limits  CBC - Abnormal; Notable for the following:    RBC 6.36 (*)    RDW 16.3 (*)    All other components within normal limits  TROPONIN I - Abnormal; Notable for the following:    Troponin I 0.04 (*)    All other components within normal limits  LIPASE, BLOOD  HEPATIC FUNCTION PANEL  PROTIME-INR  APTT  TYPE AND SCREEN   ____________________________________________  EKG  ED ECG REPORT I, Eula Listen, the attending physician, personally viewed and interpreted this ECG.   Date: 01/28/2017  EKG Time: 1311  Rate: 82  Rhythm: normal sinus rhythm  Axis: normal  Intervals:none  ST&T Change: Nonspecific T-wave inversions in V1 and V2. No STEMI.  ____________________________________________  RADIOLOGY  Dg Chest 2 View  Result Date: 01/28/2017 CLINICAL DATA:  Shortness of breath with chest pain EXAM: CHEST  2 VIEW COMPARISON:  01/05/2017 FINDINGS: Cardiac shadow is mildly enlarged but stable. Lungs are well aerated bilaterally. Previously seen pleural effusions have resolved in the interval. Calcified granuloma is again noted in the right lung base. No acute bony abnormality is noted. IMPRESSION: No active cardiopulmonary disease. Electronically Signed   By: Inez Catalina M.D.   On: 01/28/2017 13:51   Ct Angio Chest/abd/pel For Dissection W And/or W/wo  Result Date: 01/28/2017 CLINICAL DATA:  Chest pain, shortness of breath. EXAM: CT ANGIOGRAPHY CHEST, ABDOMEN AND PELVIS TECHNIQUE: Multidetector CT imaging through the chest, abdomen and pelvis was performed using the standard protocol during bolus administration of intravenous contrast.  Multiplanar reconstructed images and MIPs were obtained and reviewed to evaluate the vascular anatomy. CONTRAST:  125 mL of Isovue 370 intravenously. COMPARISON:  CT scan of chest of Jan 05, 2017. CT scan of abdomen and pelvis of December 06, 2016. FINDINGS: CTA CHEST FINDINGS Cardiovascular: There is no evidence of thoracic aortic dissection or aneurysm. Great vessels are widely patent. No pericardial effusion is noted. Mediastinum/Nodes: Mild mediastinal adenopathy is noted which is improved compared to prior exam, most consistent with improving inflammatory or reactive adenopathy. Small left thyroid cyst is noted. Lungs/Pleura: Lungs are clear. No pleural effusion or pneumothorax. Musculoskeletal: No chest wall abnormality. No acute or significant osseous findings. Review of the MIP images confirms the above findings. CTA ABDOMEN AND PELVIS FINDINGS VASCULAR Aorta: Normal caliber aorta without aneurysm, dissection, vasculitis or significant stenosis. Mild atherosclerotic calcifications are seen in the infrarenal abdominal aorta. Celiac: Patent without evidence of aneurysm, dissection, vasculitis or significant stenosis. SMA: Patent without evidence of aneurysm, dissection, vasculitis or significant stenosis. Renals: Both renal arteries are patent without evidence of aneurysm, dissection, vasculitis, fibromuscular dysplasia or significant stenosis. IMA: Patent without evidence of aneurysm, dissection, vasculitis or significant stenosis. Inflow: Patent without evidence of aneurysm, dissection, vasculitis or significant stenosis. Veins: No obvious venous abnormality within the limitations of this arterial phase study. Review of the MIP images confirms the above findings. NON-VASCULAR Hepatobiliary: No focal liver abnormality is seen. No gallstones, gallbladder wall thickening, or biliary dilatation. Pancreas: Unremarkable. No pancreatic ductal dilatation or surrounding inflammatory changes. Spleen: Normal in size  without focal abnormality. Adrenals/Urinary Tract: Adrenal glands are unremarkable. Small nonobstructive left renal calculus is noted. No hydronephrosis or renal obstruction is noted. Urinary bladder is unremarkable. Stomach/Bowel: Stomach is within normal limits. Appendix appears normal. No evidence of bowel wall thickening, distention, or inflammatory changes. Lymphatic: No adenopathy is noted. Reproductive: Prostate is unremarkable. Other: No abdominal wall hernia or abnormality. No abdominopelvic ascites. Musculoskeletal: No acute or significant osseous findings. Review of the MIP images confirms the above findings. IMPRESSION: No evidence of thoracic or abdominal aortic dissection or aneurysm. Mild atherosclerotic calcifications are seen in infrarenal abdominal aorta suggesting early atheromatous disease. Mild mediastinal adenopathy is noted which is decreased compared to prior exam, most consistent with improving inflammatory or reactive adenopathy. Small nonobstructive left renal calculus. No hydronephrosis or renal obstruction is noted. Electronically Signed   By: Marijo Conception, M.D.   On: 01/28/2017 15:04    ____________________________________________   PROCEDURES  Procedure(s) performed: None  Procedures  Critical Care performed: No ____________________________________________   INITIAL IMPRESSION / ASSESSMENT AND PLAN / ED COURSE  Pertinent  labs & imaging results that were available during my care of the patient were reviewed by me and considered in my medical decision making (see chart for details).  46 y.o. male with significant cardiac history of presenting with chest sharp stabbing chest pain, now with left abdominal pain. We'll do the clinic evaluation for his chest pain, but with his new pain, I'm concerned about aortic pathology. It is also possible he is having renal colic, which she has had in the past. Plan reevaluation for final  disposition.  ----------------------------------------- 3:13 PM on 01/28/2017 -----------------------------------------  The patient does not have any acute findings on his CT of the chest abdomen and pelvis; there is no aortic pathology. He has a left renal calculus but it is not moving some that this is not the cause of his pain. At this time, the patient's pain has improved. He does have a troponin of 0.04, and will require admission to the hospital for further evaluation and treatment.  ____________________________________________  FINAL CLINICAL IMPRESSION(S) / ED DIAGNOSES  Final diagnoses:  Chest pain  Elevated troponin  Shortness of breath  Left sided abdominal pain         NEW MEDICATIONS STARTED DURING THIS VISIT:  New Prescriptions   No medications on file      Eula Listen, MD 01/28/17 1514

## 2017-01-28 NOTE — Discharge Instructions (Signed)
Please follow up with cardiology clinic.  Return for worsening pain, sob, fevers.

## 2017-01-28 NOTE — ED Triage Notes (Signed)
Pt arrived via POV to er for c/o chest pain and shortness of breath - pt is sweating and appears to be having severe chest pain - he c/o 10/10 sharp pain - pt has hx of chest pain and has hx of chf and copd - reports chest pain has been present for 3 days

## 2017-01-28 NOTE — ED Provider Notes (Signed)
Patient received in sign-out from Dr. Mariea Clonts.  Initial plan was for admission to the hospital for further risk stratification given chest pain and elevated troponin. On further review and after hospitalist, Dr. Marthann Schiller,  Evaluation of patient it became evident the patient just had a left heart catheterization 3 weeks ago which was normal. Troponin is actually improving from baseline. CT angiogram shows no acute abnormality. I spoke with Dr. Saunders Revel, cardiology, who feels that there is likely to be no indication for admission to hospital for ACS but has recommended adding on BNP to further evaluate if there is any worsening of heart failure.  Dr. Saunders Revel does plan to come evaluate the patient at bedside. The patient denies any worsening orthopnea or shortness of breath at this time. He is not hypoxic. We'll add on BNP. Patient also complaining of right shoulder pain and states he has a history of biliary colic. We will order ultrasound evaluate for any gallbladder pathology.   ----------------------------------------- 5:06 PM on 01/28/2017 -----------------------------------------  Patient has been evaluated by Dr. Saunders Revel of cardiology at bedside. He states that there is no indication for admission to the hospital for chest pain workup.  It appears the patient has chronically elevated troponins given his chronic renal insufficiency. He has a negative CT angiogram at this time and had a negative cardiac catheter just 3 weeks ago. Patient currently in ultrasound for evaluation of any biliary pathology. We'll continue to monitor.  ----------------------------------------- 6:04 PM on 01/28/2017 -----------------------------------------  Pain has improved. Repeat troponin elevated to 0.05 but still roughly at baseline. No change in hemodynamics. Right upper quadrant is reassuring. This point I do feel the patient is appropriate for further workup as an outpatient especially given cardiology's evaluation at bedside and  extensive workup over the past 3 weeks. He is established with cardiology and has been established with heart failure clinic who are able to see him tomorrow.  Have discussed with the patient and available family all diagnostics and treatments performed thus far and all questions were answered to the best of my ability. The patient demonstrates understanding and agreement with plan.    Merlyn Lot, MD 01/28/17 [phone]

## 2017-01-29 ENCOUNTER — Telehealth: Payer: Self-pay | Admitting: Cardiovascular Disease

## 2017-01-29 NOTE — Telephone Encounter (Signed)
Lmov for patient  He was seen in ED on 01/28/17 for CP  Will try again at a later time

## 2017-01-30 ENCOUNTER — Other Ambulatory Visit
Admission: RE | Admit: 2017-01-30 | Discharge: 2017-01-30 | Disposition: A | Discharge status: Home / Self Care | Payer: Self-pay | Source: Ambulatory Visit | Attending: Nurse Practitioner | Admitting: Nurse Practitioner

## 2017-01-30 DIAGNOSIS — I5023 Acute on chronic systolic (congestive) heart failure: Secondary | ICD-10-CM | POA: Insufficient documentation

## 2017-01-30 LAB — BASIC METABOLIC PANEL
Anion gap: 12 (ref 5–15)
BUN: 33 mg/dL — ABNORMAL HIGH (ref 6–20)
CO2: 23 mmol/L (ref 22–32)
Calcium: 9.8 mg/dL (ref 8.9–10.3)
Chloride: 103 mmol/L (ref 101–111)
Creatinine, Ser: 1.5 mg/dL — ABNORMAL HIGH (ref 0.61–1.24)
GFR calc Af Amer: >60 mL/min (ref >60)
GFR calc non Af Amer: 53 mL/min — ABNORMAL LOW (ref >60)
Glucose, Bld: 148 mg/dL — ABNORMAL HIGH (ref 65–99)
Potassium: 3.9 mmol/L (ref 3.5–5.1)
Sodium: 138 mmol/L (ref 135–145)

## 2017-02-03 NOTE — Telephone Encounter (Signed)
Pt coming in on 02/28/17 to see Dr Rockey Situ Nothing further needed.

## 2017-02-05 ENCOUNTER — Emergency Department
Admission: EM | Admit: 2017-02-05 | Discharge: 2017-02-05 | Disposition: A | Discharge status: Home / Self Care | Payer: Self-pay | Attending: Emergency Medicine | Admitting: Emergency Medicine

## 2017-02-05 ENCOUNTER — Encounter: Payer: Self-pay | Admitting: *Deleted

## 2017-02-05 DIAGNOSIS — Z5321 Procedure and treatment not carried out due to patient leaving prior to being seen by health care provider: Secondary | ICD-10-CM | POA: Insufficient documentation

## 2017-02-05 DIAGNOSIS — R202 Paresthesia of skin: Secondary | ICD-10-CM | POA: Insufficient documentation

## 2017-02-05 LAB — BASIC METABOLIC PANEL
Anion gap: 13 (ref 5–15)
BUN: 24 mg/dL — ABNORMAL HIGH (ref 6–20)
CO2: 22 mmol/L (ref 22–32)
Calcium: 9.9 mg/dL (ref 8.9–10.3)
Chloride: 103 mmol/L (ref 101–111)
Creatinine, Ser: 1.54 mg/dL — ABNORMAL HIGH (ref 0.61–1.24)
GFR calc Af Amer: 60 mL/min — ABNORMAL LOW (ref >60)
GFR calc non Af Amer: 52 mL/min — ABNORMAL LOW (ref >60)
Glucose, Bld: 165 mg/dL — ABNORMAL HIGH (ref 65–99)
Potassium: 3.5 mmol/L (ref 3.5–5.1)
Sodium: 138 mmol/L (ref 135–145)

## 2017-02-05 LAB — CBC
HCT: 52.9 % — ABNORMAL HIGH (ref 40.0–52.0)
Hemoglobin: 18.3 g/dL — ABNORMAL HIGH (ref 13.0–18.0)
MCH: 27.7 pg (ref 26.0–34.0)
MCHC: 34.6 g/dL (ref 32.0–36.0)
MCV: 80.2 fL (ref 80.0–100.0)
Platelets: 204 10*3/uL (ref 150–440)
RBC: 6.59 MIL/uL — ABNORMAL HIGH (ref 4.40–5.90)
RDW: 16.4 % — ABNORMAL HIGH (ref 11.5–14.5)
WBC: 13.4 10*3/uL — ABNORMAL HIGH (ref 3.8–10.6)

## 2017-02-05 LAB — TROPONIN I: Troponin I: 0.06 ng/mL (ref <0.03)

## 2017-02-05 NOTE — ED Notes (Signed)
Pt called and states on the way to [facility].

## 2017-02-05 NOTE — ED Notes (Signed)
1st nurse, Raj Janus RN notified of pt's elevated troponin

## 2017-02-05 NOTE — ED Triage Notes (Signed)
Pt to triage via wheelchair.  Pt reports chest pain for 3 hours.  Pt reports tingling in both arms.  Pt hyperventilating in triage.  Pt alert.

## 2017-02-05 NOTE — ED Notes (Signed)
Pt left and states he is going to [facility].  Pt alert. Father with pt.

## 2017-02-27 NOTE — Progress Notes (Signed)
Patient ID: Gabriel [name]., male   DOB: 03-28-1971, 46 y.o.   MRN: [id number] Cardiology Office Note  Date:  02/28/2017   ID:  Gabriel [name]., DOB 08/31/1970, MRN [id number]  PCP:  [facility] [place]   Chief Complaint  Patient presents with  . other    F/u ED due to chest pain. Pt needs rx for Entresto. Meds reviewed verbally with pt.    HPI:  46 y.o. male with h/o  nonischemic cardiomyopathy by nuclear stress test in 2016, History of medication noncompliance chronic systolic CHF, ejection fraction 30-35% COPD,  ongoing tobacco abuse at 2 ppd since age 80,  DM2,  obesity  chronically elevated troponins given his chronic renal insufficiency. [place] on 01/03/16 with cough and SOB, septic shock, right upper lobe pneumonia,  He presents today for follow-up of his nonischemic cardiomyopathy  Last seen by myself 02/2016   was not on his medications at that time,   Recent hospitalization for shortness of breath Left heart cath 01/06/2017 Nonobstructive coronary disease, 40% in RCA  left ventricular ejection fraction  25-35% by visual estimate.  On today's visit he reports that he does not have insurance, lives with his father Has not filled out paperwork for medical management/assistance with his medications as he does not want to give his father's information Currently unemployed Reports he is trying to [facility], get disability Reports he has appointment to see [facility]  Out of his Aldactone He is receiving samples of entresto  EKG personally reviewed by myself on todays visit Shows normal sinus rhythm with rate 68 bpm, no significant ST or T-wave changes, normal EKG  hospital 02/17/2016, admitted for chest pain/pneumonia He was put back on several medications for cardiomyopathy and blood pressure  Reports having sleep study in the past, was positive, did not tolerate CPAP.  Does not want to wear that, feels he sleeps fine  On his last  clinic visit was going to the pool, doing various activities with family, Does not feel that he can work, has bills to pay, has not worked Does not have money for medications  No episodes of syncope  Previous echocardiogram reviewed with him showing improving ejection fraction up to 30-35%, previously 25% in the setting of sepsis   Other past medical history reviewed Echocardiogram in the hospital  in the setting of sepsis showed severely depressed ejection fraction 25% CXR showed right upper lobe consolidation c/w pneumonia.  started on IV Rocephin and azithromycin.  Troponin trend of 0.22-->0.19-->0.18-->1.05-->0.13 WBC count of 28,000 on arrival Febrile to a T max of 101.2 this admission.  Hypotensive with BP down to the 70's shortly after admission,   Prior to discharge was started on hydralazine, isosorbide, carvedilol. Secondary to acute renal failure, was not started on ACE inhibitor or ARB He did have short runs of nonsustained VT seen on telemetry   nonsustained VT on Holter, started on amiodarone, was asymptomatic  Early June 2017, had episode of syncope in his kitchen Denied any lightheadedness or dizziness, no warning, When out for a minute witnessed by his girlfriend, woke up on the floor Went to the hospital  He wore a 48-hour Holter monitor which was reviewed with him in detail today Monitor shows short rare runs of atrial tachycardia, also short rare episodes of nonsustained VT. He does report rare fluttering in his chest like a vibration otherwise no significant symptoms  Patient was previously admitted in August 2016 and September 2016 with  atypical chest pain.  Echo during August admission showed mildly reduced LV systolic function with mildly elevated troponin and BNP. He was hypertensive and unable to afford his medications at that time.   He was discharged back in August 2016 on antihypertensives, but did nto take them. During his September 2016 admission  for atypical chest pain and HTN he underwent nuclear stress testing that showed a small defect of mild severity in the apex location felt to be 2/2 apical thinning. EF was estimated at 30-44%. Overall, this was an intermediate risk scan. There was no evidence of significant perfusion abnormalities and he was felt to have NICM.   PMH:   has a past medical history of Chest wall pain, chronic; Chronic systolic CHF (congestive heart failure) ([place]); Chronic Troponin Elevation; CKD (chronic kidney disease), stage III; COPD (chronic obstructive pulmonary disease) ([place]); Hypertension; NICM (nonischemic cardiomyopathy) ([place]); Non-obstructive CAD (coronary artery disease); NSVT (nonsustained ventricular tachycardia) ([place]); Obesity (BMI 30.0-34.9); Psoriasis; Syncope; and Tobacco abuse.  PSH:    Past Surgical History:  Procedure Laterality Date  . AMPUTATION    . FINGER AMPUTATION     Traumatic  . LEFT HEART CATH AND CORONARY ANGIOGRAPHY N/A 01/06/2017   Procedure: Left Heart Cath and Coronary Angiography;  Surgeon: Wellington Hampshire, MD;  Location: [facility];  Service: Cardiovascular;  Laterality: N/A;    Current Outpatient Prescriptions  Medication Sig Dispense Refill  . amiodarone (PACERONE) 200 MG tablet Take 1 tablet (200 mg total) by mouth 2 (two) times daily. 90 tablet 3  . aspirin 81 MG chewable tablet Chew 1 tablet (81 mg total) by mouth daily. Reported on 02/08/2016 30 tablet 2  . metoprolol succinate (TOPROL-XL) 50 MG 24 hr tablet Take 1 tablet (50 mg total) by mouth daily. Take with or immediately following a meal. 90 tablet 3  . ondansetron (ZOFRAN ODT) 4 MG disintegrating tablet Take 1 tablet (4 mg total) by mouth every 8 (eight) hours as needed for nausea or vomiting. 20 tablet 0  . potassium chloride SA (K-DUR,KLOR-CON) 20 MEQ tablet Take 1 tablet (20 mEq total) by mouth daily. 90 tablet 3  . rosuvastatin (CRESTOR) 20 MG tablet Take 1 tablet (20 mg total) by mouth daily. 90 tablet  3  . spironolactone (ALDACTONE) 25 MG tablet Take 1 tablet (25 mg total) by mouth daily. 90 tablet 3  . torsemide (DEMADEX) 20 MG tablet Take 3 tablets (60 mg total) by mouth daily. 90 tablet 3  . sacubitril-valsartan (ENTRESTO) 97-103 MG Take 1 tablet by mouth 2 (two) times daily. 60 tablet 11   No current facility-administered medications for this visit.      Allergies:   Prednisone   Social History:  The patient  reports that he has been smoking Cigarettes.  He has been smoking about 2.00 packs per day for the past 33 years. He has never used smokeless tobacco. He reports that he drinks about 1.8 oz of alcohol per week. He reports that he does not use illicit drugs.   Family History:   family history includes Cancer in his maternal grandfather and paternal aunt; Diabetes Mellitus II in his mother; Hypertension in his father.    Review of Systems: Review of Systems  Constitutional: Positive for malaise/fatigue.  Respiratory: Negative.   Cardiovascular: Negative.   Gastrointestinal: Negative.   Musculoskeletal: Negative.   Neurological: Negative.   Psychiatric/Behavioral: Negative.   All other systems reviewed and are negative.    PHYSICAL EXAM: VS:  BP  120/82 (BP Location: Left Arm, Patient Position: Sitting, Cuff Size: Large)   Pulse 68   Ht 6' (1.829 m)   Wt 255 lb (115.7 kg)   BMI 34.58 kg/m  , BMI Body mass index is 34.58 kg/m. GEN: Well nourished, well developed, in no acute distress  HEENT: normal  Neck: no JVD, carotid bruits, or masses Cardiac: RRR; no murmurs, rubs, or gallops,no edema  Respiratory:  clear to auscultation bilaterally, normal work of breathing GI: soft, nontender, nondistended, + BS MS: no deformity or atrophy  Skin: warm and dry, no rash Neuro:  Strength and sensation are intact Psych: euthymic mood, full affect    Recent Labs: 01/06/2017: Magnesium 1.7; TSH 1.357 01/28/2017: ALT 33; B Natriuretic Peptide 51.0 02/05/2017: BUN 24;  Creatinine, Ser 1.54; Hemoglobin 18.3; Platelets 204; Potassium 3.5; Sodium 138    Lipid Panel Lab Results  Component Value Date   CHOL 74 01/04/2016   HDL 14 (L) 01/04/2016   LDLCALC 46 01/04/2016   TRIG 71 01/04/2016      Wt Readings from Last 3 Encounters:  02/28/17 255 lb (115.7 kg)  02/05/17 254 lb (115.2 kg)  01/28/17 254 lb (115.2 kg)       ASSESSMENT AND PLAN:  Syncope, unspecified syncope type No further episodes of syncope  previous episodes concerning  for arrhythmia, sustained VT  on low-dose amiodarone  reports having side effects on carvedilol   Cardiomyopathy ([place]) - Medication noncompliance, unable to afford his medications Leading to repeat hospital admissions -Long history of nonischemic cardiopathy,  Most recent ejection fraction 30-35%  Recommended he continue Aldactone, torsemide, potassium We will increase the dose of his entresto 97/103 BID and stop his amlodipine We will have him fill out paperwork for company assistance  Essential hypertension Medication changes as above  Chronic systolic heart failure ([place]) Appears euvolemic, stressed importance of staying on his medications  Cad Recent cardiac catheterization with nonobstructive disease, unrelated to his cardiomyopathy No further workup needed    Total encounter time more than 25 minutes  Greater than 50% was spent in counseling and coordination of care with the patient    Orders Placed This Encounter  Procedures  . EKG 12-Lead     Signed, Esmond Plants, M.D., Ph.D. 02/28/2017  [place], [place]

## 2017-02-28 ENCOUNTER — Encounter: Payer: Self-pay | Admitting: Cardiovascular Disease

## 2017-02-28 ENCOUNTER — Ambulatory Visit (INDEPENDENT_AMBULATORY_CARE_PROVIDER_SITE_OTHER): Payer: Self-pay | Admitting: Cardiovascular Disease

## 2017-02-28 VITALS — BP 120/82 | HR 68 | Ht 72.0 in | Wt 255.0 lb

## 2017-02-28 DIAGNOSIS — I5022 Chronic systolic (congestive) heart failure: Secondary | ICD-10-CM

## 2017-02-28 DIAGNOSIS — I420 Dilated cardiomyopathy: Secondary | ICD-10-CM

## 2017-02-28 DIAGNOSIS — J432 Centrilobular emphysema: Secondary | ICD-10-CM

## 2017-02-28 DIAGNOSIS — R0789 Other chest pain: Secondary | ICD-10-CM

## 2017-02-28 DIAGNOSIS — E1159 Type 2 diabetes mellitus with other circulatory complications: Secondary | ICD-10-CM

## 2017-02-28 MED ORDER — ROSUVASTATIN CALCIUM 20 MG PO TABS: 20.0000 mg | ORAL_TABLET | Freq: Every day | ORAL | 3 refills | Status: DC

## 2017-02-28 MED ORDER — SACUBITRIL-VALSARTAN 97-103 MG PO TABS: 1.0000 | ORAL_TABLET | Freq: Two times a day (BID) | ORAL | 11 refills | Status: DC

## 2017-02-28 MED ORDER — SPIRONOLACTONE 25 MG PO TABS: 25.0000 mg | ORAL_TABLET | Freq: Every day | ORAL | 1 refills | Status: DC

## 2017-02-28 MED ORDER — TORSEMIDE 20 MG PO TABS
60.0000 mg | ORAL_TABLET | Freq: Every day | ORAL | 3 refills | Status: DC
Start: 2017-02-28 — End: 2017-04-29

## 2017-02-28 MED ORDER — SPIRONOLACTONE 25 MG PO TABS: 25.0000 mg | ORAL_TABLET | Freq: Every day | ORAL | 3 refills | Status: DC

## 2017-02-28 MED ORDER — METOPROLOL SUCCINATE ER 50 MG PO TB24: 50.0000 mg | ORAL_TABLET | Freq: Every day | ORAL | 3 refills | Status: DC

## 2017-02-28 MED ORDER — AMIODARONE HCL 200 MG PO TABS: 200.0000 mg | ORAL_TABLET | Freq: Two times a day (BID) | ORAL | 3 refills | Status: DC

## 2017-02-28 MED ORDER — POTASSIUM CHLORIDE CRYS ER 20 MEQ PO TBCR: 20.0000 meq | EXTENDED_RELEASE_TABLET | Freq: Every day | ORAL | 3 refills | Status: DC

## 2017-02-28 NOTE — Patient Instructions (Addendum)
Medication Instructions:   Stop the amlodipine Increase the entresto up to 97/103 mg twice a day Paperwork for [employer]  Labwork:  No new labs needed  Testing/Procedures:  No further testing at this time   Follow-Up: It was a pleasure seeing you in the office today. Please call us if you have new issues that need to be addressed before your next appt.  [phone]  Your physician wants you to follow-up in: 6 months.  You will receive a reminder letter in the mail two months in advance. If you don't receive a letter, please call our office to schedule the follow-up appointment.  If you need a refill on your cardiac medications before your next appointment, please call your pharmacy.

## 2017-03-06 ENCOUNTER — Encounter: Payer: Self-pay | Admitting: Family

## 2017-03-06 ENCOUNTER — Ambulatory Visit: Payer: Self-pay | Attending: Family | Admitting: Family

## 2017-03-06 VITALS — BP 134/89 | HR 61 | Resp 20 | Ht 72.0 in | Wt 252.0 lb

## 2017-03-06 DIAGNOSIS — M5442 Lumbago with sciatica, left side: Secondary | ICD-10-CM

## 2017-03-06 DIAGNOSIS — R0602 Shortness of breath: Secondary | ICD-10-CM | POA: Insufficient documentation

## 2017-03-06 DIAGNOSIS — Z89029 Acquired absence of unspecified finger(s): Secondary | ICD-10-CM | POA: Insufficient documentation

## 2017-03-06 DIAGNOSIS — Z9889 Other specified postprocedural states: Secondary | ICD-10-CM | POA: Insufficient documentation

## 2017-03-06 DIAGNOSIS — M545 Low back pain: Secondary | ICD-10-CM | POA: Insufficient documentation

## 2017-03-06 DIAGNOSIS — R42 Dizziness and giddiness: Secondary | ICD-10-CM | POA: Insufficient documentation

## 2017-03-06 DIAGNOSIS — E119 Type 2 diabetes mellitus without complications: Secondary | ICD-10-CM

## 2017-03-06 DIAGNOSIS — Z720 Tobacco use: Secondary | ICD-10-CM

## 2017-03-06 DIAGNOSIS — Z809 Family history of malignant neoplasm, unspecified: Secondary | ICD-10-CM | POA: Insufficient documentation

## 2017-03-06 DIAGNOSIS — N183 Chronic kidney disease, stage 3 (moderate): Secondary | ICD-10-CM | POA: Insufficient documentation

## 2017-03-06 DIAGNOSIS — I10 Essential (primary) hypertension: Secondary | ICD-10-CM

## 2017-03-06 DIAGNOSIS — Z7982 Long term (current) use of aspirin: Secondary | ICD-10-CM | POA: Insufficient documentation

## 2017-03-06 DIAGNOSIS — Z79899 Other long term (current) drug therapy: Secondary | ICD-10-CM | POA: Insufficient documentation

## 2017-03-06 DIAGNOSIS — Z833 Family history of diabetes mellitus: Secondary | ICD-10-CM | POA: Insufficient documentation

## 2017-03-06 DIAGNOSIS — Z8249 Family history of ischemic heart disease and other diseases of the circulatory system: Secondary | ICD-10-CM | POA: Insufficient documentation

## 2017-03-06 DIAGNOSIS — M549 Dorsalgia, unspecified: Secondary | ICD-10-CM | POA: Insufficient documentation

## 2017-03-06 DIAGNOSIS — F17210 Nicotine dependence, cigarettes, uncomplicated: Secondary | ICD-10-CM | POA: Insufficient documentation

## 2017-03-06 DIAGNOSIS — E1122 Type 2 diabetes mellitus with diabetic chronic kidney disease: Secondary | ICD-10-CM | POA: Insufficient documentation

## 2017-03-06 DIAGNOSIS — I130 Hypertensive heart and chronic kidney disease with heart failure and stage 1 through stage 4 chronic kidney disease, or unspecified chronic kidney disease: Secondary | ICD-10-CM | POA: Insufficient documentation

## 2017-03-06 DIAGNOSIS — I5022 Chronic systolic (congestive) heart failure: Secondary | ICD-10-CM | POA: Insufficient documentation

## 2017-03-06 DIAGNOSIS — M5441 Lumbago with sciatica, right side: Secondary | ICD-10-CM

## 2017-03-06 DIAGNOSIS — I429 Cardiomyopathy, unspecified: Secondary | ICD-10-CM | POA: Insufficient documentation

## 2017-03-06 DIAGNOSIS — J441 Chronic obstructive pulmonary disease with (acute) exacerbation: Secondary | ICD-10-CM | POA: Insufficient documentation

## 2017-03-06 DIAGNOSIS — Z5189 Encounter for other specified aftercare: Secondary | ICD-10-CM | POA: Insufficient documentation

## 2017-03-06 LAB — GLUCOSE, CAPILLARY: Glucose-Capillary: 204 mg/dL — ABNORMAL HIGH (ref 65–99)

## 2017-03-06 NOTE — Patient Instructions (Signed)
Continue weighing daily and call for an overnight weight gain of > 2 pounds or a weekly weight gain of >5 pounds. 

## 2017-03-06 NOTE — Progress Notes (Signed)
Patient ID: Gabriel Erker., male    DOB: [birth year], 46 y.o.   MRN: [id number]  HPI  Gabriel [name] is a 46 y/o male with a history of HTN, diabetes, COPD, CKD, asthma, current tobacco use and chronic heart failure.   Reviewed last echo report that was done 11/28/16 and it showed an EF of 40-45% along with mild Gabriel. EF has improved from 30-35% July 2017.  Was in the [facility] ED 02/05/17 due to chest pain. Evaluated and discharged home. Was in the ED 01/28/17 for chest pain. Chronically elevated troponins. Discharged home. Last admission on 01/05/17 for an NSTEMI. Had elevated troponin and underwent cardiac catheterization. Was discharged home after 2 days on nitroglycerin and Crestor. Was in the ED 12/06/16 for a kidney stone. Evaluated and discharged home. Admitted 11/28/16 with HF exacerbation. Cardiology consult obtained. IV solu-medrol given for COPD exacerbation as well. Discharged home the next day. Admitted 10/28/16 with HF exacerbation. Initially treated with IV diuretics and transitioned to oral diuretics. Discharged home after 2 days. Has had numerous other admissions/ED visits in the last 6 months.  He presents today for a follow-up visit with a chief complaint of moderate shortness of breath with little exertion. He describes this as chronic in nature and has been present for several years with varying severity. He has associated fatigue, chest tightness, cough and light-headedness along with this. Has been experiencing low back pain for the last 2 weeks.   Past Medical History:  Diagnosis Date  . Chest wall pain, chronic   . Chronic systolic CHF (congestive heart failure) ([place])    a. 03/2015 Echo: EF 45-50%; b. 12/2015 Echo: EF 20-25%; c. 02/2016 Echo: EF 30-35%; d. 11/2016 Echo: EF 40-45%.  . Chronic Troponin Elevation   . CKD (chronic kidney disease), stage III   . COPD (chronic obstructive pulmonary disease) ([place])   . Hypertension    Resolved since weight loss  . NICM (nonischemic  cardiomyopathy) ([place])    a. 03/2015 Echo: EF 45-50%; b. 04/2015 MV: small defect of mild severity in apex 2/2 apical thinning, EF 30-44%;  c. 12/2015 Echo: EF 20-25%; d. 02/2016 Echo: EF 30-35%; e. 11/2016 Echo: EF 40-45%, diff HK, mildly dil Ao root, mild Gabriel, mod dil LA;  f. 12/2016 Cath: LM nl, LAD/Diags/LCX/OMs min irregs, RCA 40p/m/d.  . Non-obstructive CAD (coronary artery disease)    a. 04/2015 low risk MV;  b. 12/2016 Cath: minor irregs in LAD/Diag/LCX/OM, RCA 40p/m/d.  [name] (nonsustained ventricular tachycardia) ([place])    a. 12/2015 noted on tele-->amio;  b. 12/2015 Event monitor: no VT. Atach noted.  . Obesity (BMI 30.0-34.9)   . Psoriasis   . Syncope    a. 01/2016 - felt to be vasovagal.  . Tobacco abuse    Past Surgical History:  Procedure Laterality Date  . AMPUTATION    . FINGER AMPUTATION     Traumatic  . LEFT HEART CATH AND CORONARY ANGIOGRAPHY N/A 01/06/2017   Procedure: Left Heart Cath and Coronary Angiography;  Surgeon: Wellington Hampshire, MD;  Location: [facility];  Service: Cardiovascular;  Laterality: N/A;   Family History  Problem Relation Age of Onset  . Diabetes Mellitus II Mother   . Hypertension Father   . Cancer Paternal Aunt   . Cancer Maternal Grandfather    Social History  Substance Use Topics  . Smoking status: Current Some Day Smoker    Packs/day: 0.00    Years: 33.00    Types:  Cigarettes  . Smokeless tobacco: Never Used     Comment: currently smoking a few cigs/day.  . Alcohol use 1.8 oz/week    3 Cans of beer per week     Comment: occassionally   Allergies  Allergen Reactions  . Prednisone Other (See Comments)    Reaction: Hallucinations    Prior to Admission medications   Medication Sig Start Date End Date Taking? Authorizing Provider  aspirin 81 MG chewable tablet Chew 1 tablet (81 mg total) by mouth daily. Reported on 02/08/2016 09/19/16  Yes Earleen Newport, MD  sacubitril-valsartan (ENTRESTO) 49-51 MG Take 2 tablets by mouth 2  (two) times daily.   Yes [provider]  amiodarone (PACERONE) 200 MG tablet Take 1 tablet (200 mg total) by mouth 2 (two) times daily. Patient not taking: Reported on 03/06/2017 02/28/17   Minna Merritts, MD  metoprolol succinate (TOPROL-XL) 50 MG 24 hr tablet Take 1 tablet (50 mg total) by mouth daily. Take with or immediately following a meal. Patient not taking: Reported on 03/06/2017 02/28/17   Minna Merritts, MD  ondansetron (ZOFRAN ODT) 4 MG disintegrating tablet Take 1 tablet (4 mg total) by mouth every 8 (eight) hours as needed for nausea or vomiting. Patient not taking: Reported on 03/06/2017 12/06/16   Darel Hong, MD  potassium chloride SA (K-DUR,KLOR-CON) 20 MEQ tablet Take 1 tablet (20 mEq total) by mouth daily. Patient not taking: Reported on 03/06/2017 02/28/17   Minna Merritts, MD  rosuvastatin (CRESTOR) 20 MG tablet Take 1 tablet (20 mg total) by mouth daily. Patient not taking: Reported on 03/06/2017 02/28/17 05/29/17  Minna Merritts, MD  sacubitril-valsartan (ENTRESTO) 97-103 MG Take 1 tablet by mouth 2 (two) times daily. Patient not taking: Reported on 03/06/2017 02/28/17   Minna Merritts, MD  spironolactone (ALDACTONE) 25 MG tablet Take 1 tablet (25 mg total) by mouth daily. Patient not taking: Reported on 03/06/2017 02/28/17   Minna Merritts, MD  torsemide (DEMADEX) 20 MG tablet Take 3 tablets (60 mg total) by mouth daily. Patient not taking: Reported on 03/06/2017 02/28/17   Minna Merritts, MD   Review of Systems  Constitutional: Positive for fatigue. Negative for appetite change.  HENT: Positive for congestion. Negative for postnasal drip and sore throat.   Eyes: Negative.   Respiratory: Positive for cough, chest tightness and shortness of breath (with overexertion).   Cardiovascular: Negative for chest pain, palpitations and leg swelling.  Gastrointestinal: Negative for abdominal distention and abdominal pain.  Endocrine: Negative.    Genitourinary: Negative.   Musculoskeletal: Negative for neck pain.  Skin: Negative.   Allergic/Immunologic: Negative.   Neurological: Positive for light-headedness (last week). Negative for dizziness.  Hematological: Negative for adenopathy. Does not bruise/bleed easily.  Psychiatric/Behavioral: Positive for sleep disturbance. Negative for dysphoric mood and suicidal ideas. The patient is nervous/anxious (stress).    Vitals:   03/06/17 0854  BP: 134/89  Pulse: 61  Resp: 20  SpO2: 99%  Weight: 252 lb (114.3 kg)  Height: 6' (1.829 m)   Wt Readings from Last 3 Encounters:  03/06/17 252 lb (114.3 kg)  02/28/17 255 lb (115.7 kg)  02/05/17 254 lb (115.2 kg)    Lab Results  Component Value Date   CREATININE 1.54 (H) 02/05/2017   CREATININE 1.50 (H) 01/30/2017   CREATININE 1.30 (H) 01/28/2017   Physical Exam  Constitutional: He is oriented to person, place, and time. He appears well-developed and well-nourished.  HENT:  Head: Normocephalic and  atraumatic.  Neck: Normal range of motion. Neck supple. No JVD present.  Cardiovascular: Normal rate and regular rhythm.   Pulmonary/Chest: Effort normal. He has no wheezes. He has no rales. He exhibits tenderness.  Abdominal: Soft. He exhibits no distension. There is no tenderness.  Musculoskeletal: He exhibits no edema or tenderness.  Neurological: He is alert and oriented to person, place, and time.  Skin: Skin is warm and dry.  Psychiatric: Thought content normal. His mood appears not anxious. He is not agitated.  Nursing note and vitals reviewed.   Assessment & Plan:  1: Chronic heart failure with reduced ejection fraction- - NYHA class II - euvolemic - weigh daily and call for an overnight weight gain of >2 pounds or a weekly weight gain of >5 pounds.  - not adding salt and he was encouraged him to closely follow a 2000mg  sodium diet - currently out of his torsemide but he was tolerating his torsemide 60mg  daily; he would  like to try taking lower number of tablets so we discussed writing the prescription for 100mg  tablet and taking 1/2 of it. Will discuss further at future visit - taking entresto as 49/51mg  2 tablets BID until he gets paperwork turned in and approved for the 97/103mg  dose -BMP looks good from 01/14/17; potassium 4.0 and GFR > 60 - saw cardiologist Rockey Situ) 02/28/17  2: HTN- - entresto increased by cardiology on 02/28/17 - has primary care appointment at [facility] on 03/12/17  3: Back pain- - this started 2 weeks ago and has been chronic ever since. Pain is in lower back and radiates down the lateral thighs - no injury that he's aware of - advised him to alternate heat/ice and try stretching to relieve pain  4: Tobacco use- - has only smoked 1 cigarette since he was last here - complete cessation discussed for 3 minutes with him  5: Diabetes-   - does not check sugar as he does not have a glucometer - last A1c 6.5% on 01/05/17 - fasting glucose in the office today was 204  He has been out of all his medications except the entresto. He says that he's not going to give out his father's income information (who he lives with) to medication management clinic. All prescriptions were sent to [facility] by cardiology on 02/28/17. He was advised to go by there today and tell them of his above PCP appointment and see if he can get some of his prescriptions picked up.   Patient did not bring his medications nor a list. Each medication was verbally reviewed with the patient and he was encouraged to bring the bottles to every visit to confirm accuracy of list.  Return in 1 month or sooner for any questions/problems before then.

## 2017-03-12 DIAGNOSIS — F17200 Nicotine dependence, unspecified, uncomplicated: Secondary | ICD-10-CM | POA: Insufficient documentation

## 2017-03-13 ENCOUNTER — Telehealth: Payer: Self-pay | Admitting: Cardiovascular Disease

## 2017-03-13 ENCOUNTER — Encounter: Payer: Self-pay | Admitting: Pharmacist

## 2017-03-13 ENCOUNTER — Ambulatory Visit: Payer: Self-pay | Admitting: Pharmacist

## 2017-03-13 ENCOUNTER — Other Ambulatory Visit: Payer: Self-pay | Admitting: Family

## 2017-03-13 VITALS — BP 170/95 | Ht 72.0 in | Wt 252.0 lb

## 2017-03-13 DIAGNOSIS — E119 Type 2 diabetes mellitus without complications: Secondary | ICD-10-CM

## 2017-03-13 DIAGNOSIS — Z79899 Other long term (current) drug therapy: Secondary | ICD-10-CM

## 2017-03-13 DIAGNOSIS — I5022 Chronic systolic (congestive) heart failure: Secondary | ICD-10-CM

## 2017-03-13 MED ORDER — SACUBITRIL-VALSARTAN 97-103 MG PO TABS: 1.0000 | ORAL_TABLET | Freq: Two times a day (BID) | ORAL | 3 refills | Status: DC

## 2017-03-13 NOTE — Telephone Encounter (Signed)
[name] called from [facility] stating pt was seen there yesterday  His BP was high he stated to them he had not taken his BP Medication   They are asking Gabriel [name] to please start sending his rx to them  They are not sure if he was out on medication and are also asking if we can send them the prescription for his [name] 16-58 they are asking if we can send in a 90 day refill  Please advise

## 2017-03-13 NOTE — Progress Notes (Signed)
Medication Management Clinic Visit Note  Patient: Gabriel [name]. MRN: [id number] Date of Birth: 1971/08/17 PCP: [place], [place], seen at [facility] heart failure clinic by [name], NP   [name] 45 y.o. male presents for an initial MTM visit today. Primary objective is to reconcile medications.   BP (!) 170/95   Ht 6' (1.829 m)   Wt 252 lb (114.3 kg)   BMI 34.18 kg/m   Patient Information   Past Medical History:  Diagnosis Date  . Chest wall pain, chronic   . Chronic systolic CHF (congestive heart failure) ([place])    a. 03/2015 Echo: EF 45-50%; b. 12/2015 Echo: EF 20-25%; c. 02/2016 Echo: EF 30-35%; d. 11/2016 Echo: EF 40-45%.  . Chronic Troponin Elevation   . CKD (chronic kidney disease), stage III   . COPD (chronic obstructive pulmonary disease) ([place])   . Hypertension    Resolved since weight loss  . NICM (nonischemic cardiomyopathy) ([place])    a. 03/2015 Echo: EF 45-50%; b. 04/2015 MV: small defect of mild severity in apex 2/2 apical thinning, EF 30-44%;  c. 12/2015 Echo: EF 20-25%; d. 02/2016 Echo: EF 30-35%; e. 11/2016 Echo: EF 40-45%, diff HK, mildly dil Ao root, mild MR, mod dil LA;  f. 12/2016 Cath: LM nl, LAD/Diags/LCX/OMs min irregs, RCA 40p/m/d.  . Non-obstructive CAD (coronary artery disease)    a. 04/2015 low risk MV;  b. 12/2016 Cath: minor irregs in LAD/Diag/LCX/OM, RCA 40p/m/d.  [name] (nonsustained ventricular tachycardia) ([place])    a. 12/2015 noted on tele-->amio;  b. 12/2015 Event monitor: no VT. Atach noted.  . Obesity (BMI 30.0-34.9)   . Psoriasis   . Syncope    a. 01/2016 - felt to be vasovagal.  . Tobacco abuse       Past Surgical History:  Procedure Laterality Date  . AMPUTATION    . FINGER AMPUTATION     Traumatic  . LEFT HEART CATH AND CORONARY ANGIOGRAPHY N/A 01/06/2017   Procedure: Left Heart Cath and Coronary Angiography;  Surgeon: Wellington Hampshire, MD;  Location: [facility];  Service: Cardiovascular;  Laterality:  N/A;     Family History  Problem Relation Age of Onset  . Diabetes Mellitus II Mother   . Hypertension Father   . Cancer Paternal Aunt   . Cancer Maternal Grandfather     New Diagnoses (since last visit): n/a  Family Support: dad, helps with transportation    Current Exercise Habits: Home exercise routine, Type of exercise: walking, Intensity: Mild       History  Alcohol Use  . 1.8 oz/week  . 3 Cans of beer per week    Comment: occassionally      History  Smoking Status  . Current Some Day Smoker  . Packs/day: 0.00  . Years: 33.00  . Types: Cigarettes  Smokeless Tobacco  . Never Used    Comment: currently smoking a few cigs/day.      Health Maintenance  Topic Date Due  . FOOT EXAM  01/31/1979  . OPHTHALMOLOGY EXAM  01/31/1979  . URINE MICROALBUMIN  01/31/1979  . TETANUS/TDAP  01/31/1988  . INFLUENZA VACCINE  03/19/2017  . HEMOGLOBIN A1C  07/09/2017  . PNEUMOCOCCAL POLYSACCHARIDE VACCINE (2) 01/05/2021  . HIV Screening  Completed   Outpatient Encounter Prescriptions as of 03/13/2017  Medication Sig  . amiodarone (PACERONE) 200 MG tablet Take 1 tablet (200 mg total) by mouth 2 (two) times daily.  Marland Kitchen aspirin  81 MG chewable tablet Chew 1 tablet (81 mg total) by mouth daily. Reported on 02/08/2016  . metoprolol succinate (TOPROL-XL) 50 MG 24 hr tablet Take 1 tablet (50 mg total) by mouth daily. Take with or immediately following a meal.  . potassium chloride SA (K-DUR,KLOR-CON) 20 MEQ tablet Take 1 tablet (20 mEq total) by mouth daily.  . rosuvastatin (CRESTOR) 20 MG tablet Take 1 tablet (20 mg total) by mouth daily.  . sacubitril-valsartan (ENTRESTO) 49-51 MG Take 2 tablets by mouth 2 (two) times daily.  Marland Kitchen spironolactone (ALDACTONE) 25 MG tablet Take 1 tablet (25 mg total) by mouth daily.  Marland Kitchen torsemide (DEMADEX) 20 MG tablet Take 3 tablets (60 mg total) by mouth daily.  . ondansetron (ZOFRAN ODT) 4 MG disintegrating tablet Take 1 tablet (4 mg total) by mouth every  8 (eight) hours as needed for nausea or vomiting. (Patient not taking: Reported on 03/06/2017)  . sacubitril-valsartan (ENTRESTO) 97-103 MG Take 1 tablet by mouth 2 (two) times daily.   No facility-administered encounter medications on file as of 03/13/2017.     Assessment and Plan:   Compliance/Adherance: Counseled pt on importance of compliance/adherance and how this positively affects health outcomes. pt knows 3W's (what/when/why) for all his medications without prompting. States that he does not miss doses, but has been without his medications for a week. This was due to a misunderstanding of what financial information was required for continued service at [facility]. Pt will be bringing back requested information along with his Medicaid denial letter on Tues 7/31.   HTN: BP today 170/95. Pt states this is high since he has been out of medications for 1 week. Goal BP is  < 140/90. We are filling all medications today which pt will pick up at end of visit. We will obtain the Entresto directly from the manufacturer and pt is aware that we need the financial information/application returned so that we can submit for patient assistance. It normally takes 4 weeks to rcv medication via PAP and he states that he has Entresto samples from the MD.  CHF: NYHA class II  EF has improved from 30-35% 02/2016 to 40-45% in 11/2016. Pt knows to weigh daily and call HF clinic for gain of >2lbs./day or > 5lbs/week.  DM: last A1c of 6.5 on 01/05/17. Pt is not currently on any medications for diabetes at this time. Will request that MD consider adding basal insulin.   Smoking Cessation: pt states that he has quit and has only smoked 3 cigs randomly after his hospital admission.  Netta Neat, PharmD, [facility]) [phone]

## 2017-03-13 NOTE — Telephone Encounter (Signed)
Please advise. Looks like patients Gabriel [name] was increased the last time he ws seen by [name]

## 2017-03-13 NOTE — Telephone Encounter (Signed)
Pharmacy calling to request that we send prescription over to their pharmacy. Sent it over electronically and left voicemail message with them to call if any further questions.

## 2017-04-03 ENCOUNTER — Ambulatory Visit: Payer: Self-pay | Attending: Family | Admitting: Family

## 2017-04-03 ENCOUNTER — Encounter: Payer: Self-pay | Admitting: Family

## 2017-04-03 ENCOUNTER — Telehealth: Payer: Self-pay | Admitting: Family

## 2017-04-03 VITALS — BP 149/102 | HR 87 | Resp 20 | Ht 72.0 in | Wt 260.1 lb

## 2017-04-03 DIAGNOSIS — N183 Chronic kidney disease, stage 3 (moderate): Secondary | ICD-10-CM | POA: Insufficient documentation

## 2017-04-03 DIAGNOSIS — E669 Obesity, unspecified: Secondary | ICD-10-CM | POA: Insufficient documentation

## 2017-04-03 DIAGNOSIS — J449 Chronic obstructive pulmonary disease, unspecified: Secondary | ICD-10-CM | POA: Insufficient documentation

## 2017-04-03 DIAGNOSIS — Z720 Tobacco use: Secondary | ICD-10-CM

## 2017-04-03 DIAGNOSIS — Z7982 Long term (current) use of aspirin: Secondary | ICD-10-CM | POA: Insufficient documentation

## 2017-04-03 DIAGNOSIS — M5441 Lumbago with sciatica, right side: Secondary | ICD-10-CM

## 2017-04-03 DIAGNOSIS — Z87442 Personal history of urinary calculi: Secondary | ICD-10-CM | POA: Insufficient documentation

## 2017-04-03 DIAGNOSIS — R200 Anesthesia of skin: Secondary | ICD-10-CM | POA: Insufficient documentation

## 2017-04-03 DIAGNOSIS — M5442 Lumbago with sciatica, left side: Secondary | ICD-10-CM

## 2017-04-03 DIAGNOSIS — I25119 Atherosclerotic heart disease of native coronary artery with unspecified angina pectoris: Secondary | ICD-10-CM | POA: Insufficient documentation

## 2017-04-03 DIAGNOSIS — L409 Psoriasis, unspecified: Secondary | ICD-10-CM | POA: Insufficient documentation

## 2017-04-03 DIAGNOSIS — E1122 Type 2 diabetes mellitus with diabetic chronic kidney disease: Secondary | ICD-10-CM | POA: Insufficient documentation

## 2017-04-03 DIAGNOSIS — E119 Type 2 diabetes mellitus without complications: Secondary | ICD-10-CM

## 2017-04-03 DIAGNOSIS — I130 Hypertensive heart and chronic kidney disease with heart failure and stage 1 through stage 4 chronic kidney disease, or unspecified chronic kidney disease: Secondary | ICD-10-CM | POA: Insufficient documentation

## 2017-04-03 DIAGNOSIS — R202 Paresthesia of skin: Secondary | ICD-10-CM | POA: Insufficient documentation

## 2017-04-03 DIAGNOSIS — I5022 Chronic systolic (congestive) heart failure: Secondary | ICD-10-CM | POA: Insufficient documentation

## 2017-04-03 DIAGNOSIS — I429 Cardiomyopathy, unspecified: Secondary | ICD-10-CM | POA: Insufficient documentation

## 2017-04-03 DIAGNOSIS — J441 Chronic obstructive pulmonary disease with (acute) exacerbation: Secondary | ICD-10-CM | POA: Insufficient documentation

## 2017-04-03 DIAGNOSIS — F17210 Nicotine dependence, cigarettes, uncomplicated: Secondary | ICD-10-CM | POA: Insufficient documentation

## 2017-04-03 DIAGNOSIS — I10 Essential (primary) hypertension: Secondary | ICD-10-CM

## 2017-04-03 DIAGNOSIS — M545 Low back pain: Secondary | ICD-10-CM | POA: Insufficient documentation

## 2017-04-03 LAB — GLUCOSE, CAPILLARY: Glucose-Capillary: 228 mg/dL — ABNORMAL HIGH (ref 65–99)

## 2017-04-03 LAB — BASIC METABOLIC PANEL
Anion gap: 10 (ref 5–15)
BUN: 19 mg/dL (ref 6–20)
CO2: 24 mmol/L (ref 22–32)
Calcium: 9.6 mg/dL (ref 8.9–10.3)
Chloride: 107 mmol/L (ref 101–111)
Creatinine, Ser: 1.07 mg/dL (ref 0.61–1.24)
GFR calc Af Amer: >60 mL/min (ref >60)
GFR calc non Af Amer: >60 mL/min (ref >60)
Glucose, Bld: 207 mg/dL — ABNORMAL HIGH (ref 65–99)
Potassium: 3.7 mmol/L (ref 3.5–5.1)
Sodium: 141 mmol/L (ref 135–145)

## 2017-04-03 LAB — HEMOGLOBIN A1C
Hgb A1c MFr Bld: 7.3 % — ABNORMAL HIGH (ref 4.8–5.6)
Mean Plasma Glucose: 162.81 mg/dL

## 2017-04-03 NOTE — Progress Notes (Signed)
Patient ID: Gabriel [name]., male    DOB: 1971-03-26, 46 y.o.   MRN: [id number]  HPI  Gabriel [name] is a 46 y/o male with a history of HTN, diabetes, COPD, CKD, asthma, current tobacco use and chronic heart failure.   Reviewed last echo report that was done 11/28/16 and it showed an EF of 40-45% along with mild Gabriel. EF has improved from 30-35% July 2017.  Was in the [facility] ED 02/05/17 due to chest pain. Evaluated and discharged home. Was in the ED 01/28/17 for chest pain. Chronically elevated troponins. Discharged home. Last admission on 01/05/17 for an NSTEMI. Had elevated troponin and underwent cardiac catheterization. Was discharged home after 2 days on nitroglycerin and Crestor. Was in the ED 12/06/16 for a kidney stone. Evaluated and discharged home. Admitted 11/28/16 with HF exacerbation. Cardiology consult obtained. IV solu-medrol given for COPD exacerbation as well. Discharged home the next day. Admitted 10/28/16 with HF exacerbation. Initially treated with IV diuretics and transitioned to oral diuretics. Discharged home after 2 days. Has had numerous other admissions/ED visits in the last 6 months.  He presents today for a follow-up visit with a chief complaint of low back pain with radiation down both of his legs. He describes this as having been present for almost a month now with no relief. Says that it's worse with sitting/standing and does improve some when he lies down. Says that his pain goes from a 10 down to 4-5 when he lies down. Twisted his back using an auger in the 1990's causing injury to his lower back but no recent injury. Has associated tingling/pain down the outside of both lower legs. Denies any urinary/bowel difficulties.   Past Medical History:  Diagnosis Date  . Chest wall pain, chronic   . Chronic systolic CHF (congestive heart failure) ([place])    a. 03/2015 Echo: EF 45-50%; b. 12/2015 Echo: EF 20-25%; c. 02/2016 Echo: EF 30-35%; d. 11/2016 Echo: EF 40-45%.  . Chronic Troponin  Elevation   . CKD (chronic kidney disease), stage III   . COPD (chronic obstructive pulmonary disease) ([place])   . Hypertension    Resolved since weight loss  . NICM (nonischemic cardiomyopathy) ([place])    a. 03/2015 Echo: EF 45-50%; b. 04/2015 MV: small defect of mild severity in apex 2/2 apical thinning, EF 30-44%;  c. 12/2015 Echo: EF 20-25%; d. 02/2016 Echo: EF 30-35%; e. 11/2016 Echo: EF 40-45%, diff HK, mildly dil Ao root, mild Gabriel, mod dil LA;  f. 12/2016 Cath: LM nl, LAD/Diags/LCX/OMs min irregs, RCA 40p/m/d.  . Non-obstructive CAD (coronary artery disease)    a. 04/2015 low risk MV;  b. 12/2016 Cath: minor irregs in LAD/Diag/LCX/OM, RCA 40p/m/d.  [name] (nonsustained ventricular tachycardia) ([place])    a. 12/2015 noted on tele-->amio;  b. 12/2015 Event monitor: no VT. Atach noted.  . Obesity (BMI 30.0-34.9)   . Psoriasis   . Syncope    a. 01/2016 - felt to be vasovagal.  . Tobacco abuse    Past Surgical History:  Procedure Laterality Date  . AMPUTATION    . FINGER AMPUTATION     Traumatic  . LEFT HEART CATH AND CORONARY ANGIOGRAPHY N/A 01/06/2017   Procedure: Left Heart Cath and Coronary Angiography;  Surgeon: Wellington Hampshire, MD;  Location: [place] [facility];  Service: Cardiovascular;  Laterality: N/A;   Family History  Problem Relation Age of Onset  . Diabetes Mellitus II Mother   . Hypertension Father   .  Cancer Paternal Aunt   . Cancer Maternal Grandfather    Social History  Substance Use Topics  . Smoking status: Current Some Day Smoker    Packs/day: 0.00    Years: 33.00    Types: Cigarettes  . Smokeless tobacco: Never Used     Comment: currently smoking a few cigs/day.  . Alcohol use 1.8 oz/week    3 Cans of beer per week     Comment: occassionally   Allergies  Allergen Reactions  . Prednisone Other (See Comments)    Reaction: Hallucinations    Prior to Admission medications   Medication Sig Start Date End Date Taking? Authorizing Provider  amiodarone (PACERONE)  200 MG tablet Take 1 tablet (200 mg total) by mouth 2 (two) times daily. 02/28/17  Yes Minna Merritts, MD  aspirin 81 MG chewable tablet Chew 1 tablet (81 mg total) by mouth daily. Reported on 02/08/2016 09/19/16  Yes Earleen Newport, MD  metoprolol succinate (TOPROL-XL) 50 MG 24 hr tablet Take 1 tablet (50 mg total) by mouth daily. Take with or immediately following a meal. 02/28/17  Yes Gollan, Kathlene November, MD  potassium chloride SA (K-DUR,KLOR-CON) 20 MEQ tablet Take 1 tablet (20 mEq total) by mouth daily. 02/28/17  Yes Minna Merritts, MD  rosuvastatin (CRESTOR) 20 MG tablet Take 1 tablet (20 mg total) by mouth daily. 02/28/17 05/29/17 Yes Gollan, Kathlene November, MD  sacubitril-valsartan (ENTRESTO) 97-103 MG Take 1 tablet by mouth 2 (two) times daily. 03/13/17  Yes Minna Merritts, MD  spironolactone (ALDACTONE) 25 MG tablet Take 1 tablet (25 mg total) by mouth daily. 02/28/17  Yes Minna Merritts, MD  torsemide (DEMADEX) 20 MG tablet Take 3 tablets (60 mg total) by mouth daily. 02/28/17  Yes Minna Merritts, MD    Review of Systems  Constitutional: Positive for fatigue. Negative for appetite change.  HENT: Positive for congestion. Negative for postnasal drip and sore throat.   Eyes: Negative.   Respiratory: Positive for cough. Negative for chest tightness and shortness of breath.   Cardiovascular: Negative for chest pain and leg swelling.  Gastrointestinal: Negative for abdominal distention and abdominal pain.  Endocrine: Negative.   Genitourinary: Negative.   Musculoskeletal: Positive for back pain (lower back). Negative for neck pain.  Skin: Negative.   Allergic/Immunologic: Negative.   Neurological: Positive for numbness (in legs/tingling down legs). Negative for dizziness and light-headedness.  Hematological: Negative for adenopathy. Does not bruise/bleed easily.  Psychiatric/Behavioral: Positive for sleep disturbance (due to pain down his legs). Negative for dysphoric mood and  suicidal ideas. The patient is nervous/anxious (stress).    Vitals:   04/03/17 0857  BP: (!) 149/102  Pulse: 87  Resp: 20  SpO2: 100%  Weight: 260 lb 2 oz (118 kg)  Height: 6' (1.829 m)   Wt Readings from Last 3 Encounters:  04/03/17 260 lb 2 oz (118 kg)  03/13/17 252 lb (114.3 kg)  03/06/17 252 lb (114.3 kg)    Lab Results  Component Value Date   CREATININE 1.54 (H) 02/05/2017   CREATININE 1.50 (H) 01/30/2017   CREATININE 1.30 (H) 01/28/2017   Physical Exam  Constitutional: He is oriented to person, place, and time. He appears well-developed and well-nourished.  HENT:  Head: Normocephalic and atraumatic.  Neck: Normal range of motion. Neck supple. No JVD present.  Cardiovascular: Normal rate and regular rhythm.   Pulmonary/Chest: Effort normal. He has no wheezes. He has no rales. He exhibits no tenderness.  Abdominal: Soft.  He exhibits no distension. There is no tenderness.  Musculoskeletal: He exhibits tenderness. He exhibits no edema.       Lumbar back: He exhibits decreased range of motion, tenderness and pain. He exhibits no swelling.  Neurological: He is alert and oriented to person, place, and time.  Skin: Skin is warm and dry.  Psychiatric: He has a normal mood and affect. His behavior is normal. Thought content normal.  Nursing note and vitals reviewed.   Assessment & Plan:  1: Chronic heart failure with reduced ejection fraction- - NYHA class II - euvolemic - weigh daily and call for an overnight weight gain of >2 pounds or a weekly weight gain of >5 pounds.  - not adding salt and he was encouraged to closely follow a 2000mg  sodium diet - still taking entresto as 49/51mg  2 tablets BID until medication management clinic can get him the 97/103mg  dose - BMP drawn today - saw cardiologist Rockey Situ) 02/28/17  2: HTN- - BP elevated but patient is experiencing quite a bit of pain - entresto increased by cardiology on 02/28/17 - had primary care appointment at  [facility] on 03/12/17 and returns on 04/09/17  3: Back pain- - this started ~4 weeks ago and has been chronic ever since. Pain is in lower back and radiates down the lateral thighs; also experiencing numbness/tingling down lateral thighs as well - no recent injury that he's aware of - will get a LS xray done; he says that he can get it done next week  4: Tobacco use- - has only smoked a few cigarettes since he was last here - complete cessation discussed for 3 minutes with him  5: Diabetes-   - does not check glucose as he does not have a glucometer - last A1c 6.5% on 01/06/17 - fasting glucose in the office today was 228 - will get an A1c today  Patient did not bring his medications nor a list. Each medication was verbally reviewed with the patient and he was encouraged to bring the bottles to every visit to confirm accuracy of list.  Return in 1 month or sooner for any questions/problems before then.

## 2017-04-03 NOTE — Patient Instructions (Signed)
Continue weighing daily and call for an overnight weight gain of > 2 pounds or a weekly weight gain of >5 pounds. 

## 2017-04-03 NOTE — Telephone Encounter (Signed)
Spoke with patient regarding lab work that was drawn on 04/03/17. Advised him that his potassium and renal function looked good. A1c was elevated at 7.3%. Will fax these results to [facility] for their review.

## 2017-04-10 ENCOUNTER — Telehealth: Payer: Self-pay | Admitting: Family

## 2017-04-10 ENCOUNTER — Ambulatory Visit
Admission: RE | Admit: 2017-04-10 | Discharge: 2017-04-10 | Disposition: A | Discharge status: Home / Self Care | Payer: Self-pay | Source: Ambulatory Visit | Attending: Family | Admitting: Family

## 2017-04-10 DIAGNOSIS — M5441 Lumbago with sciatica, right side: Secondary | ICD-10-CM | POA: Insufficient documentation

## 2017-04-10 DIAGNOSIS — I700 Atherosclerosis of aorta: Secondary | ICD-10-CM | POA: Insufficient documentation

## 2017-04-10 DIAGNOSIS — M5442 Lumbago with sciatica, left side: Secondary | ICD-10-CM | POA: Insufficient documentation

## 2017-04-10 DIAGNOSIS — I708 Atherosclerosis of other arteries: Secondary | ICD-10-CM | POA: Insufficient documentation

## 2017-04-10 DIAGNOSIS — M47896 Other spondylosis, lumbar region: Secondary | ICD-10-CM | POA: Insufficient documentation

## 2017-04-10 DIAGNOSIS — M4186 Other forms of scoliosis, lumbar region: Secondary | ICD-10-CM | POA: Insufficient documentation

## 2017-04-10 NOTE — Telephone Encounter (Signed)
Reviewed lumbar spine xrays that were obtained 04/10/17. Shows mild lumbar scoliosis, diffuse degenerative change without bony abnormality identified. Aortoiliac atherosclerotic vascular disease.  Explained this to patient and said that we faxed results to PCP. Discussed the likelihood of needing vascular appointment. He doesn't think that he's filled out paperwork for the charity care program here at the hospital so we will mail him that application.   PCP to determine if any pain medications would be appropriate or not. Patient was appreciative of the call back.

## 2017-04-18 ENCOUNTER — Telehealth: Payer: Self-pay | Admitting: Pharmacist

## 2017-04-18 NOTE — Telephone Encounter (Signed)
04/18/17 Faxed [employer] application for [employer] 97/103 Take one tablet by mouth two times a day.[name]

## 2017-04-28 ENCOUNTER — Observation Stay
Admission: EM | Admit: 2017-04-28 | Discharge: 2017-04-29 | Disposition: A | Discharge status: Home / Self Care | Payer: Self-pay | Attending: Internal Medicine | Admitting: Internal Medicine

## 2017-04-28 ENCOUNTER — Emergency Department: Payer: Self-pay

## 2017-04-28 DIAGNOSIS — Z794 Long term (current) use of insulin: Secondary | ICD-10-CM | POA: Insufficient documentation

## 2017-04-28 DIAGNOSIS — R0789 Other chest pain: Secondary | ICD-10-CM | POA: Diagnosis present

## 2017-04-28 DIAGNOSIS — F17210 Nicotine dependence, cigarettes, uncomplicated: Secondary | ICD-10-CM | POA: Insufficient documentation

## 2017-04-28 DIAGNOSIS — E861 Hypovolemia: Secondary | ICD-10-CM | POA: Insufficient documentation

## 2017-04-28 DIAGNOSIS — J449 Chronic obstructive pulmonary disease, unspecified: Secondary | ICD-10-CM | POA: Insufficient documentation

## 2017-04-28 DIAGNOSIS — I2510 Atherosclerotic heart disease of native coronary artery without angina pectoris: Secondary | ICD-10-CM | POA: Diagnosis present

## 2017-04-28 DIAGNOSIS — N183 Chronic kidney disease, stage 3 (moderate): Secondary | ICD-10-CM | POA: Insufficient documentation

## 2017-04-28 DIAGNOSIS — E785 Hyperlipidemia, unspecified: Secondary | ICD-10-CM | POA: Insufficient documentation

## 2017-04-28 DIAGNOSIS — R7989 Other specified abnormal findings of blood chemistry: Secondary | ICD-10-CM | POA: Diagnosis present

## 2017-04-28 DIAGNOSIS — E876 Hypokalemia: Secondary | ICD-10-CM | POA: Insufficient documentation

## 2017-04-28 DIAGNOSIS — E1122 Type 2 diabetes mellitus with diabetic chronic kidney disease: Secondary | ICD-10-CM | POA: Insufficient documentation

## 2017-04-28 DIAGNOSIS — E860 Dehydration: Secondary | ICD-10-CM | POA: Insufficient documentation

## 2017-04-28 DIAGNOSIS — N179 Acute kidney failure, unspecified: Principal | ICD-10-CM

## 2017-04-28 DIAGNOSIS — I5022 Chronic systolic (congestive) heart failure: Secondary | ICD-10-CM | POA: Insufficient documentation

## 2017-04-28 DIAGNOSIS — I5042 Chronic combined systolic (congestive) and diastolic (congestive) heart failure: Secondary | ICD-10-CM | POA: Diagnosis present

## 2017-04-28 DIAGNOSIS — J441 Chronic obstructive pulmonary disease with (acute) exacerbation: Secondary | ICD-10-CM | POA: Diagnosis present

## 2017-04-28 DIAGNOSIS — I25118 Atherosclerotic heart disease of native coronary artery with other forms of angina pectoris: Secondary | ICD-10-CM | POA: Diagnosis present

## 2017-04-28 DIAGNOSIS — I10 Essential (primary) hypertension: Secondary | ICD-10-CM | POA: Diagnosis present

## 2017-04-28 DIAGNOSIS — R778 Other specified abnormalities of plasma proteins: Secondary | ICD-10-CM | POA: Diagnosis present

## 2017-04-28 DIAGNOSIS — F4323 Adjustment disorder with mixed anxiety and depressed mood: Secondary | ICD-10-CM | POA: Insufficient documentation

## 2017-04-28 DIAGNOSIS — Z91148 Patient's other noncompliance with medication regimen for other reason: Secondary | ICD-10-CM

## 2017-04-28 DIAGNOSIS — Z9114 Patient's other noncompliance with medication regimen: Secondary | ICD-10-CM | POA: Insufficient documentation

## 2017-04-28 DIAGNOSIS — R55 Syncope and collapse: Secondary | ICD-10-CM

## 2017-04-28 DIAGNOSIS — Z7982 Long term (current) use of aspirin: Secondary | ICD-10-CM | POA: Insufficient documentation

## 2017-04-28 DIAGNOSIS — Z6835 Body mass index (BMI) 35.0-35.9, adult: Secondary | ICD-10-CM | POA: Insufficient documentation

## 2017-04-28 DIAGNOSIS — E782 Mixed hyperlipidemia: Secondary | ICD-10-CM | POA: Diagnosis present

## 2017-04-28 DIAGNOSIS — L409 Psoriasis, unspecified: Secondary | ICD-10-CM | POA: Insufficient documentation

## 2017-04-28 DIAGNOSIS — I951 Orthostatic hypotension: Secondary | ICD-10-CM | POA: Diagnosis present

## 2017-04-28 DIAGNOSIS — I130 Hypertensive heart and chronic kidney disease with heart failure and stage 1 through stage 4 chronic kidney disease, or unspecified chronic kidney disease: Secondary | ICD-10-CM | POA: Insufficient documentation

## 2017-04-28 DIAGNOSIS — E669 Obesity, unspecified: Secondary | ICD-10-CM | POA: Insufficient documentation

## 2017-04-28 DIAGNOSIS — M419 Scoliosis, unspecified: Secondary | ICD-10-CM | POA: Insufficient documentation

## 2017-04-28 DIAGNOSIS — Z720 Tobacco use: Secondary | ICD-10-CM | POA: Diagnosis present

## 2017-04-28 LAB — URINALYSIS, COMPLETE (UACMP) WITH MICROSCOPIC
Bacteria, UA: NONE SEEN
Bilirubin Urine: NEGATIVE
Glucose, UA: 150 mg/dL — AB
Hgb urine dipstick: NEGATIVE
Ketones, ur: NEGATIVE mg/dL
Leukocytes, UA: NEGATIVE
Nitrite: NEGATIVE
Protein, ur: NEGATIVE mg/dL
Specific Gravity, Urine: 1.012 (ref 1.005–1.030)
Squamous Epithelial / LPF: NONE SEEN
pH: 5 (ref 5.0–8.0)

## 2017-04-28 LAB — CBC
HCT: 48.9 % (ref 40.0–52.0)
Hemoglobin: 17 g/dL (ref 13.0–18.0)
MCH: 29.9 pg (ref 26.0–34.0)
MCHC: 34.8 g/dL (ref 32.0–36.0)
MCV: 85.9 fL (ref 80.0–100.0)
Platelets: 194 10*3/uL (ref 150–440)
RBC: 5.69 MIL/uL (ref 4.40–5.90)
RDW: 15.3 % — ABNORMAL HIGH (ref 11.5–14.5)
WBC: 14.1 10*3/uL — ABNORMAL HIGH (ref 3.8–10.6)

## 2017-04-28 LAB — HEMOGLOBIN A1C
Hgb A1c MFr Bld: 7.1 % — ABNORMAL HIGH (ref 4.8–5.6)
Mean Plasma Glucose: 157.07 mg/dL

## 2017-04-28 LAB — TROPONIN I
Troponin I: 0.06 ng/mL (ref <0.03)
Troponin I: 0.04 ng/mL (ref <0.03)
Troponin I: 0.04 ng/mL (ref <0.03)

## 2017-04-28 LAB — BASIC METABOLIC PANEL
Anion gap: 14 (ref 5–15)
BUN: 40 mg/dL — ABNORMAL HIGH (ref 6–20)
CO2: 20 mmol/L — ABNORMAL LOW (ref 22–32)
Calcium: 9.6 mg/dL (ref 8.9–10.3)
Chloride: 100 mmol/L — ABNORMAL LOW (ref 101–111)
Creatinine, Ser: 2.75 mg/dL — ABNORMAL HIGH (ref 0.61–1.24)
GFR calc Af Amer: 30 mL/min — ABNORMAL LOW (ref >60)
GFR calc non Af Amer: 26 mL/min — ABNORMAL LOW (ref >60)
Glucose, Bld: 256 mg/dL — ABNORMAL HIGH (ref 65–99)
Potassium: 3.8 mmol/L (ref 3.5–5.1)
Sodium: 134 mmol/L — ABNORMAL LOW (ref 135–145)

## 2017-04-28 LAB — GLUCOSE, CAPILLARY
Glucose-Capillary: 268 mg/dL — ABNORMAL HIGH (ref 65–99)
Glucose-Capillary: 152 mg/dL — ABNORMAL HIGH (ref 65–99)
Glucose-Capillary: 250 mg/dL — ABNORMAL HIGH (ref 65–99)

## 2017-04-28 LAB — HEPATIC FUNCTION PANEL
ALT: 32 U/L (ref 17–63)
AST: 26 U/L (ref 15–41)
Albumin: 4.3 g/dL (ref 3.5–5.0)
Alkaline Phosphatase: 78 U/L (ref 38–126)
Bilirubin, Direct: 0.3 mg/dL (ref 0.1–0.5)
Indirect Bilirubin: 2.4 mg/dL — ABNORMAL HIGH (ref 0.3–0.9)
Total Bilirubin: 2.7 mg/dL — ABNORMAL HIGH (ref 0.3–1.2)
Total Protein: 8.2 g/dL — ABNORMAL HIGH (ref 6.5–8.1)

## 2017-04-28 LAB — TSH: TSH: 2.132 u[IU]/mL (ref 0.350–4.500)

## 2017-04-28 LAB — BRAIN NATRIURETIC PEPTIDE: B Natriuretic Peptide: 25 pg/mL (ref 0.0–100.0)

## 2017-04-28 MED ORDER — AMIODARONE HCL 200 MG PO TABS
200.0000 mg | ORAL_TABLET | Freq: Two times a day (BID) | ORAL | Status: DC
  Administered 2017-04-28 – 2017-04-29 (×2): 200 mg via ORAL
  Filled 2017-04-28 (×2): qty 1

## 2017-04-28 MED ORDER — SODIUM CHLORIDE 0.9 % IV SOLN
INTRAVENOUS | Status: DC
  Administered 2017-04-28: 18:00:00 via INTRAVENOUS

## 2017-04-28 MED ORDER — ASPIRIN 81 MG PO CHEW
81.0000 mg | CHEWABLE_TABLET | Freq: Every day | ORAL | Status: DC
  Administered 2017-04-29: 81 mg via ORAL
  Filled 2017-04-28: qty 1

## 2017-04-28 MED ORDER — TRAMADOL HCL 50 MG PO TABS
ORAL_TABLET | ORAL | Status: AC
  Administered 2017-04-28: 18:00:00
  Filled 2017-04-28: qty 1

## 2017-04-28 MED ORDER — METHYLPREDNISOLONE 4 MG PO TABS: 8.0000 mg | ORAL_TABLET | Freq: Every day | ORAL | Status: DC

## 2017-04-28 MED ORDER — TRAMADOL HCL 50 MG PO TABS
50.0000 mg | ORAL_TABLET | Freq: Once | ORAL | Status: AC
  Administered 2017-04-28: 50 mg via ORAL

## 2017-04-28 MED ORDER — OXYCODONE HCL 5 MG PO TABS
5.0000 mg | ORAL_TABLET | Freq: Four times a day (QID) | ORAL | Status: DC | PRN
  Administered 2017-04-28 – 2017-04-29 (×4): 5 mg via ORAL
  Filled 2017-04-28 (×4): qty 1

## 2017-04-28 MED ORDER — METHYLPREDNISOLONE 4 MG PO TABS: 4.0000 mg | ORAL_TABLET | Freq: Every day | ORAL | Status: DC

## 2017-04-28 MED ORDER — ACETAMINOPHEN 650 MG RE SUPP: 650.0000 mg | Freq: Four times a day (QID) | RECTAL | Status: DC | PRN

## 2017-04-28 MED ORDER — NICOTINE 14 MG/24HR TD PT24
14.0000 mg | MEDICATED_PATCH | Freq: Every day | TRANSDERMAL | Status: DC
  Filled 2017-04-28: qty 1

## 2017-04-28 MED ORDER — ONDANSETRON HCL 4 MG PO TABS: 4.0000 mg | ORAL_TABLET | Freq: Four times a day (QID) | ORAL | Status: DC | PRN

## 2017-04-28 MED ORDER — ONDANSETRON HCL 4 MG/2ML IJ SOLN: 4.0000 mg | Freq: Four times a day (QID) | INTRAMUSCULAR | Status: DC | PRN

## 2017-04-28 MED ORDER — ROSUVASTATIN CALCIUM 10 MG PO TABS
20.0000 mg | ORAL_TABLET | Freq: Every day | ORAL | Status: DC
  Administered 2017-04-29: 20 mg via ORAL
  Filled 2017-04-28: qty 2

## 2017-04-28 MED ORDER — METHYLPREDNISOLONE 4 MG PO TABS
16.0000 mg | ORAL_TABLET | Freq: Once | ORAL | Status: AC
  Administered 2017-04-28: 16 mg via ORAL
  Filled 2017-04-28 (×2): qty 4

## 2017-04-28 MED ORDER — INSULIN ASPART 100 UNIT/ML ~~LOC~~ SOLN
0.0000 [IU] | Freq: Every day | SUBCUTANEOUS | Status: DC
  Administered 2017-04-28: 2 [IU] via SUBCUTANEOUS
  Filled 2017-04-28: qty 1

## 2017-04-28 MED ORDER — HEPARIN SODIUM (PORCINE) 5000 UNIT/ML IJ SOLN
5000.0000 [IU] | Freq: Three times a day (TID) | INTRAMUSCULAR | Status: DC
  Administered 2017-04-28 – 2017-04-29 (×2): 5000 [IU] via SUBCUTANEOUS
  Filled 2017-04-28 (×2): qty 1

## 2017-04-28 MED ORDER — ACETAMINOPHEN 325 MG PO TABS: 650.0000 mg | ORAL_TABLET | Freq: Four times a day (QID) | ORAL | Status: DC | PRN

## 2017-04-28 MED ORDER — METHYLPREDNISOLONE 16 MG PO TABS
16.0000 mg | ORAL_TABLET | Freq: Every day | ORAL | Status: AC
  Administered 2017-04-29: 16 mg via ORAL
  Filled 2017-04-28: qty 1

## 2017-04-28 MED ORDER — SODIUM CHLORIDE 0.9 % IV SOLN
1000.0000 mL | Freq: Once | INTRAVENOUS | Status: AC
  Administered 2017-04-28: 1000 mL via INTRAVENOUS

## 2017-04-28 MED ORDER — INSULIN ASPART 100 UNIT/ML ~~LOC~~ SOLN
0.0000 [IU] | Freq: Three times a day (TID) | SUBCUTANEOUS | Status: DC
  Administered 2017-04-29: 3 [IU] via SUBCUTANEOUS
  Administered 2017-04-29: 5 [IU] via SUBCUTANEOUS
  Filled 2017-04-28 (×4): qty 1

## 2017-04-28 NOTE — H&P (Signed)
[facility] NAME: Gabriel [name]    MR#:  [id number]  DATE OF BIRTH:  [birth year]  DATE OF ADMISSION:  04/28/2017  PRIMARY CARE PHYSICIAN: [facility] [place]   REQUESTING/REFERRING PHYSICIAN: Dr. Lavonia Drafts  CHIEF COMPLAINT:   Chief Complaint  Patient presents with  . Near Syncope    HISTORY OF PRESENT ILLNESS:  Gabriel [name]  is a 46 y.o. male with a known history of non-ischemic cardiomyopathy with most recent ejection fraction of 40%, COPD not on home oxygen, ongoing smoking, sleep apnea not on CPAP, diet-controlled diabetes mellitus, obesity, CKD, chronically elevated troponin secondary to CKD, medication noncompliance, adjustment disorder with depression and anxiety presents to hospital secondary to a near syncopal episode today. Patient has been following up at CHF clinic. He has trouble with hypertension and is on very high doses of entresto. Also takes Diuretics Aldactone and torsemide for his CHF. He feels that his oral intake has been about the same. He has been feeling weak and tired for the last couple of days. Getting dizzy and lightheaded easily on standing up. Symptoms were much worse today, this morning he tried to start up and then immediately sank to the ground. He did not lose consciousness. Denies any fevers or chills. Has also been having trouble with low back pain radiating down his both legs associated with some tingling and numbness. This has been going on for almost 2 months now. Denies any changes in urination. Has lower abdominal discomfort. No nausea or vomiting. Had one episode of nosebleed couple of days ago. Noted to have acute renal failure on chronic renal insufficiency and dehydration. Hypertension on presentation which could've caused his syncope.  PAST MEDICAL HISTORY:   Past Medical History:  Diagnosis Date  . Chest wall pain, chronic   . Chronic systolic CHF (congestive heart  failure) ([place])    a. 03/2015 Echo: EF 45-50%; b. 12/2015 Echo: EF 20-25%; c. 02/2016 Echo: EF 30-35%; d. 11/2016 Echo: EF 40-45%.  . Chronic Troponin Elevation   . CKD (chronic kidney disease), stage III   . COPD (chronic obstructive pulmonary disease) ([place])   . Hypertension    Resolved since weight loss  . NICM (nonischemic cardiomyopathy) ([place])    a. 03/2015 Echo: EF 45-50%; b. 04/2015 MV: small defect of mild severity in apex 2/2 apical thinning, EF 30-44%;  c. 12/2015 Echo: EF 20-25%; d. 02/2016 Echo: EF 30-35%; e. 11/2016 Echo: EF 40-45%, diff HK, mildly dil Ao root, mild MR, mod dil LA;  f. 12/2016 Cath: LM nl, LAD/Diags/LCX/OMs min irregs, RCA 40p/m/d.  . Non-obstructive CAD (coronary artery disease)    a. 04/2015 low risk MV;  b. 12/2016 Cath: minor irregs in LAD/Diag/LCX/OM, RCA 40p/m/d.  [name] (nonsustained ventricular tachycardia) ([place])    a. 12/2015 noted on tele-->amio;  b. 12/2015 Event monitor: no VT. Atach noted.  . Obesity (BMI 30.0-34.9)   . Psoriasis   . Syncope    a. 01/2016 - felt to be vasovagal.  . Tobacco abuse     PAST SURGICAL HISTORY:   Past Surgical History:  Procedure Laterality Date  . AMPUTATION    . FINGER AMPUTATION     Traumatic  . LEFT HEART CATH AND CORONARY ANGIOGRAPHY N/A 01/06/2017   Procedure: Left Heart Cath and Coronary Angiography;  Surgeon: Wellington Hampshire, MD;  Location: [facility];  Service: Cardiovascular;  Laterality: N/A;    SOCIAL HISTORY:  Social History  Substance Use Topics  . Smoking status: Current Some Day Smoker    Packs/day: 0.50    Years: 33.00    Types: Cigarettes  . Smokeless tobacco: Never Used     Comment: currently smoking a few cigs/day.  . Alcohol use 1.8 oz/week    3 Cans of beer per week     Comment: occassionally    FAMILY HISTORY:   Family History  Problem Relation Age of Onset  . Diabetes Mellitus II Mother   . Hypertension Father   . Cancer Paternal Aunt   . Cancer Maternal Grandfather      DRUG ALLERGIES:   Allergies  Allergen Reactions  . Prednisone Other (See Comments)    Reaction: Hallucinations     REVIEW OF SYSTEMS:   Review of Systems  Constitutional: Positive for malaise/fatigue. Negative for chills, fever and weight loss.  HENT: Negative for ear discharge, ear pain, hearing loss and nosebleeds.   Eyes: Negative for blurred vision, double vision and photophobia.  Respiratory: Positive for shortness of breath. Negative for cough, hemoptysis and wheezing.   Cardiovascular: Negative for chest pain, palpitations, orthopnea and leg swelling.  Gastrointestinal: Positive for abdominal pain. Negative for constipation, diarrhea, heartburn, melena, nausea and vomiting.  Genitourinary: Negative for dysuria, frequency and urgency.  Musculoskeletal: Positive for back pain and myalgias. Negative for neck pain.  Skin: Negative for rash.  Neurological: Positive for dizziness. Negative for tingling, sensory change, speech change, focal weakness and headaches.  Endo/Heme/Allergies: Does not bruise/bleed easily.  Psychiatric/Behavioral: Negative for depression. The patient is nervous/anxious.     MEDICATIONS AT HOME:   Prior to Admission medications   Medication Sig Start Date End Date Taking? Authorizing Provider  amiodarone (PACERONE) 200 MG tablet Take 1 tablet (200 mg total) by mouth 2 (two) times daily. 02/28/17  Yes Minna Merritts, MD  aspirin 81 MG chewable tablet Chew 1 tablet (81 mg total) by mouth daily. Reported on 02/08/2016 09/19/16  Yes Earleen Newport, MD  metoprolol succinate (TOPROL-XL) 50 MG 24 hr tablet Take 1 tablet (50 mg total) by mouth daily. Take with or immediately following a meal. 02/28/17  Yes Gollan, Kathlene November, MD  potassium chloride SA (K-DUR,KLOR-CON) 20 MEQ tablet Take 1 tablet (20 mEq total) by mouth daily. 02/28/17  Yes Minna Merritts, MD  rosuvastatin (CRESTOR) 20 MG tablet Take 1 tablet (20 mg total) by mouth daily. 02/28/17 05/29/17  Yes Gollan, Kathlene November, MD  sacubitril-valsartan (ENTRESTO) 97-103 MG Take 1 tablet by mouth 2 (two) times daily. 03/13/17  Yes Minna Merritts, MD  spironolactone (ALDACTONE) 25 MG tablet Take 1 tablet (25 mg total) by mouth daily. 02/28/17  Yes Minna Merritts, MD  torsemide (DEMADEX) 20 MG tablet Take 3 tablets (60 mg total) by mouth daily. 02/28/17  Yes Minna Merritts, MD      VITAL SIGNS:  Blood pressure 116/85, pulse 77, temperature 98.2 F (36.8 C), temperature source Oral, resp. rate (!) 26, height 6' (1.829 m), weight 117.9 kg (260 lb), SpO2 98 %.  PHYSICAL EXAMINATION:   Physical Exam  GENERAL:  46 y.o.-year-old obese patient lying in the bed with no acute distress.  EYES: Pupils equal, round, reactive to light and accommodation. No scleral icterus. Extraocular muscles intact.  HEENT: Head atraumatic, normocephalic. Oropharynx and nasopharynx clear.  NECK:  Supple, no jugular venous distention. No thyroid enlargement, no tenderness.  LUNGS: Normal breath sounds bilaterally, no wheezing, rales,rhonchi or crepitation. No use of  accessory muscles of respiration. Decreased bibasilar breath sounds CARDIOVASCULAR: S1, S2 normal. No murmurs, rubs, or gallops.  ABDOMEN: Soft, nontender but slight discomfort in lower abdominal palpation, obese,nondistended. Bowel sounds present. No organomegaly or mass.  EXTREMITIES: No pedal edema, cyanosis, or clubbing.  NEUROLOGIC: Cranial nerves II through XII are intact. Muscle strength 5/5 in all extremities. Sensation intact. Gait not checked.  PSYCHIATRIC: The patient is alert and oriented x 3.  SKIN: No obvious rash, lesion, or ulcer.   LABORATORY PANEL:   CBC  Recent Labs Lab 04/28/17 1410  WBC 14.1*  HGB 17.0  HCT 48.9  PLT 194   ------------------------------------------------------------------------------------------------------------------  Chemistries   Recent Labs Lab 04/28/17 1410  NA 134*  K 3.8  CL 100*  CO2 20*   GLUCOSE 256*  BUN 40*  CREATININE 2.75*  CALCIUM 9.6  AST 26  ALT 32  ALKPHOS 78  BILITOT 2.7*   ------------------------------------------------------------------------------------------------------------------  Cardiac Enzymes  Recent Labs Lab 04/28/17 1410  TROPONINI 0.06*   ------------------------------------------------------------------------------------------------------------------  RADIOLOGY:  Dg Chest Portable 1 View  Result Date: 04/28/2017 CLINICAL DATA:  Weakness. EXAM: PORTABLE CHEST 1 VIEW COMPARISON:  Radiographs of January 28, 2017. FINDINGS: The heart size and mediastinal contours are within normal limits. Both lungs are clear. No pneumothorax or pleural effusion is noted. The visualized skeletal structures are unremarkable. IMPRESSION: No acute cardiopulmonary abnormality seen. Electronically Signed   By: Marijo Conception, M.D.   On: 04/28/2017 14:56    EKG:   Orders placed or performed during the hospital encounter of 04/28/17  . EKG 12-Lead  . EKG 12-Lead  . ED EKG  . ED EKG    IMPRESSION AND PLAN:   Thomas Mabry  is a 46 y.o. male with a known history of non-ischemic cardiomyopathy with most recent ejection fraction of 40%, COPD not on home oxygen, ongoing smoking, sleep apnea not on CPAP, diet-controlled diabetes mellitus, obesity, CKD, chronically elevated troponin secondary to CK D, medication noncompliance, adjustment disorder with depression and anxiety presents to hospital secondary to a near syncopal episode today.  #1 syncope-secondary to vasovagal, hypovolemia causing orthostatic hypotension. -Hold his blood pressure medications. Admit to telemetry. -recycle troponins. Gentle hydration today. -Check orthostatics. -Carotid Dopplers and echocardiogram have been ordered.  #2 acute renal failure- has baseline CK D stage 2, baseline creatinine around 1.5. -Creatinine today is 2.7. Hold torsemide, Aldactone, and chest 2. -Gentle hydration and  monitor  #3 diabetes mellitus-last A1c as outpatient was 6.5 in May 2018. Not taking any medications currently. -However sugars have been in the 200s recently. Check A1c and start sliding scale insulin  #4 lower back pain with lumbar [place] you were symptoms-started on Medrol dose pack. -Pain medications as needed. Outpatient follow-up with a physiatrist - lumbar spine x-rays done for the same about 2 weeks ago showing scoliosis and diffuse degenerative changes.  #5 chronic nonischemic cardiomyopathy with systolic heart failure-most recent ejection fraction was around 40%. -Dehydrated on appearance. So diuretics on hold. Follows with CHF clinic. -Adjust the doses at discharge. Low-salt diet recommended. -Also has underlying sleep apnea and will need repeat evaluation and CPAP.  #6 DVT prophylaxis- heparin SQ  PT consult for dizziness    All the records are reviewed and case discussed with ED provider. Management plans discussed with the patient, family and they are in agreement.  CODE STATUS: Full Code  TOTAL TIME TAKING CARE OF THIS PATIENT: 50 minutes.    Gladstone Lighter M.D on 04/28/2017 at 4:02 PM  Between  7am to 6pm - Pager - [phone]  After 6pm go to [email or web address] - password [email or web address] [facility]  Office  [phone]  CC: Primary care physician; [facility]

## 2017-04-28 NOTE — ED Provider Notes (Signed)
[facility] Emergency Department Provider Note   ____________________________________________    I have reviewed the triage vital signs and the nursing notes.   HISTORY  Chief Complaint Near Syncope     HPI Gabriel [name]. is a 46 y.o. male who presents with complaints of dizziness and lightheadedness. He also complains of generalized fatigue and weakness. He reports over the last 2 days he has felt fatigued and low energy. Today he started feeling very lightheaded whenever he would stand up and thought that he might faint. He denies chest pain, he has had some strange sensations in his left arm which she describes as tingling. He does have a history of CHF. No nausea vomiting or abdominal pain   Past Medical History:  Diagnosis Date  . Chest wall pain, chronic   . Chronic systolic CHF (congestive heart failure) ([place])    a. 03/2015 Echo: EF 45-50%; b. 12/2015 Echo: EF 20-25%; c. 02/2016 Echo: EF 30-35%; d. 11/2016 Echo: EF 40-45%.  . Chronic Troponin Elevation   . CKD (chronic kidney disease), stage III   . COPD (chronic obstructive pulmonary disease) ([place])   . Hypertension    Resolved since weight loss  . NICM (nonischemic cardiomyopathy) ([place])    a. 03/2015 Echo: EF 45-50%; b. 04/2015 MV: small defect of mild severity in apex 2/2 apical thinning, EF 30-44%;  c. 12/2015 Echo: EF 20-25%; d. 02/2016 Echo: EF 30-35%; e. 11/2016 Echo: EF 40-45%, diff HK, mildly dil Ao root, mild MR, mod dil LA;  f. 12/2016 Cath: LM nl, LAD/Diags/LCX/OMs min irregs, RCA 40p/m/d.  . Non-obstructive CAD (coronary artery disease)    a. 04/2015 low risk MV;  b. 12/2016 Cath: minor irregs in LAD/Diag/LCX/OM, RCA 40p/m/d.  [name] (nonsustained ventricular tachycardia) ([place])    a. 12/2015 noted on tele-->amio;  b. 12/2015 Event monitor: no VT. Atach noted.  . Obesity (BMI 30.0-34.9)   . Psoriasis   . Syncope    a. 01/2016 - felt to be vasovagal.  . Tobacco abuse     Patient Active  Problem List   Diagnosis Date Noted  . Back pain 03/06/2017  . Tobacco use 12/04/2016  . Gallbladder sludge 12/04/2016  . HCAP (healthcare-associated pneumonia) 03/15/2016  . Coronary artery disease, non-occlusive 03/15/2016  . Atypical chest pain 02/16/2016  . Orthostatic hypotension 01/23/2016  . Diarrhea 01/23/2016  . Hypokalemia 01/23/2016  . Hypomagnesemia 01/23/2016  . Syncope 01/22/2016  . Congestive dilated cardiomyopathy ([place]) 01/17/2016  . NSVT (nonsustained ventricular tachycardia) ([place])   . 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 CAP (community acquired pneumonia)   . Arterial hypotension   . Elevated troponin   . Chronic systolic heart failure ([place])   . Sepsis ([place]) 01/03/2016  . Diabetes ([place]) 05/17/2015  . Hyperlipidemia 05/17/2015  . COPD (chronic obstructive pulmonary disease) ([place]) 05/17/2015  . HTN (hypertension) 03/31/2015    Past Surgical History:  Procedure Laterality Date  . AMPUTATION    . FINGER AMPUTATION     Traumatic  . LEFT HEART CATH AND CORONARY ANGIOGRAPHY N/A 01/06/2017   Procedure: Left Heart Cath and Coronary Angiography;  Surgeon: Wellington Hampshire, MD;  Location: [facility];  Service: Cardiovascular;  Laterality: N/A;    Prior to Admission medications   Medication Sig Start Date End Date Taking? Authorizing Provider  amiodarone (PACERONE) 200 MG tablet Take 1 tablet (200 mg total) by mouth 2 (two) times daily. 02/28/17  Yes Minna Merritts,  MD  aspirin 81 MG chewable tablet Chew 1 tablet (81 mg total) by mouth daily. Reported on 02/08/2016 09/19/16  Yes Earleen Newport, MD  metoprolol succinate (TOPROL-XL) 50 MG 24 hr tablet Take 1 tablet (50 mg total) by mouth daily. Take with or immediately following a meal. 02/28/17  Yes Gollan, Kathlene November, MD  potassium chloride SA (K-DUR,KLOR-CON) 20 MEQ tablet Take 1 tablet (20 mEq total) by mouth daily. 02/28/17  Yes Minna Merritts, MD  rosuvastatin (CRESTOR) 20 MG tablet Take 1  tablet (20 mg total) by mouth daily. 02/28/17 05/29/17 Yes Gollan, Kathlene November, MD  sacubitril-valsartan (ENTRESTO) 97-103 MG Take 1 tablet by mouth 2 (two) times daily. 03/13/17  Yes Minna Merritts, MD  spironolactone (ALDACTONE) 25 MG tablet Take 1 tablet (25 mg total) by mouth daily. 02/28/17  Yes Minna Merritts, MD  torsemide (DEMADEX) 20 MG tablet Take 3 tablets (60 mg total) by mouth daily. 02/28/17  Yes Minna Merritts, MD     Allergies Prednisone  Family History  Problem Relation Age of Onset  . Diabetes Mellitus II Mother   . Hypertension Father   . Cancer Paternal Aunt   . Cancer Maternal Grandfather     Social History Social History  Substance Use Topics  . Smoking status: Current Some Day Smoker    Packs/day: 0.00    Years: 33.00    Types: Cigarettes  . Smokeless tobacco: Never Used     Comment: currently smoking a few cigs/day.  . Alcohol use 1.8 oz/week    3 Cans of beer per week     Comment: occassionally    Review of Systems  Constitutional: dizziness as above, no fevers Eyes: No visual changes.  ENT: No sore throat. Cardiovascular: Denies chest pain. Respiratory: Denies shortness of breath. Gastrointestinal: No abdominal pain.  No nausea, no vomiting.   Genitourinary: Negative for dysuria. Musculoskeletal: Negative for back pain. Skin: Negative for rash. Neurological: Negative for headaches    ____________________________________________   PHYSICAL EXAM:  VITAL SIGNS: ED Triage Vitals [04/28/17 1410]  Enc Vitals Group     BP 92/69     Pulse Rate 95     Resp 18     Temp 98.2 F (36.8 C)     Temp Source Oral     SpO2 96 %     Weight 117.9 kg (260 lb)     Height 1.829 m (6')     Head Circumference      Peak Flow      Pain Score      Pain Loc      Pain Edu?      Excl. in [place]?     Constitutional: Alert and oriented.  Pleasant and interactive Eyes: Conjunctivae are normal.   Nose: No congestion/rhinnorhea. Mouth/Throat: Mucous  membranes are moist.    Cardiovascular: Normal rate, regular rhythm. Grossly normal heart sounds.  Good peripheral circulation. Respiratory:mild tachypnea..  No retractions. Lungs CTAB. Gastrointestinal: Soft and nontender. No distention.  No CVA tenderness. Genitourinary: deferred Musculoskeletal: No lower extremity tenderness nor edema.  Warm and well perfused Neurologic:  Normal speech and language. No gross focal neurologic deficits are appreciated.  Skin:  Skin is warm, dry and intact. No rash noted. Psychiatric: Mood and affect are normal. Speech and behavior are normal.  ____________________________________________   LABS (all labs ordered are listed, but only abnormal results are displayed)  Labs Reviewed  BASIC METABOLIC PANEL - Abnormal; Notable for the following:  Result Value   Sodium 134 (*)    Chloride 100 (*)    CO2 20 (*)    Glucose, Bld 256 (*)    BUN 40 (*)    Creatinine, Ser 2.75 (*)    GFR calc non Af Amer 26 (*)    GFR calc Af Amer 30 (*)    All other components within normal limits  CBC - Abnormal; Notable for the following:    WBC 14.1 (*)    RDW 15.3 (*)    All other components within normal limits  TROPONIN I - Abnormal; Notable for the following:    Troponin I 0.06 (*)    All other components within normal limits  GLUCOSE, CAPILLARY - Abnormal; Notable for the following:    Glucose-Capillary 268 (*)    All other components within normal limits  BRAIN NATRIURETIC PEPTIDE  URINALYSIS, COMPLETE (UACMP) WITH MICROSCOPIC  HEPATIC FUNCTION PANEL  CBG MONITORING, ED   ____________________________________________  EKG  ED ECG REPORT I, Lavonia Drafts, the attending physician, personally viewed and interpreted this ECG.  Date: 04/28/2017  Rhythm: normal sinus rhythm QRS Axis: normal Intervals: normal ST/T Wave abnormalities: normal Narrative Interpretation: no evidence of acute  ischemia  ____________________________________________  RADIOLOGY  Chest x-ray unremarkable ____________________________________________   PROCEDURES  Procedure(s) performed: No    Critical Care performed:No ____________________________________________   INITIAL IMPRESSION / ASSESSMENT AND PLAN / ED COURSE  Pertinent labs & imaging results that were available during my care of the patient were reviewed by me and considered in my medical decision making (see chart for details).  Patient presents with complaints of dizziness and lightheadedness, his blood pressure is 92/69.lab work demonstrates elevated creatinine and BUN, suspect acute kidney injury causing hypotension as the cause of his lightheadedness. I will admit the patient to the hospitalist service for further evaluation    ____________________________________________   FINAL CLINICAL IMPRESSION(S) / ED DIAGNOSES  Final diagnoses:  Acute renal failure, unspecified acute renal failure type ([place])      NEW MEDICATIONS STARTED DURING THIS VISIT:  New Prescriptions   No medications on file     Note:  This document was prepared using Dragon voice recognition software and may include unintentional dictation errors.    Lavonia Drafts, MD 04/28/17 [phone]

## 2017-04-28 NOTE — ED Notes (Signed)
FN: dizzy, left arm tingling and nauseas for two days.

## 2017-04-28 NOTE — ED Notes (Signed)
Patient denies pain and is resting comfortably.  

## 2017-04-28 NOTE — ED Triage Notes (Signed)
Pt reporting feeling like he is going to pass out X 2 days, left arm weakness X 2 days. Hx of CHF.

## 2017-04-28 NOTE — Progress Notes (Signed)
Pt complaining of lower back pain radiating to bilateral legs, pt only has tylenol ordered. Pt rating his pain 10 out of 10. MD paged, Dr. Edwina Barth to place orders for oxycodone. Will await orders & give.

## 2017-04-28 NOTE — ED Notes (Addendum)
ED Provider at bedside. Pt placed on monitor, changed into gown. Fall risk armband placed. Call bell within reach, side rails up x 2.

## 2017-04-28 NOTE — ED Notes (Signed)
AAOx3.  Skin warm and dry.  NAD 

## 2017-04-29 ENCOUNTER — Observation Stay (HOSPITAL_BASED_OUTPATIENT_CLINIC_OR_DEPARTMENT_OTHER)
Admit: 2017-04-29 | Discharge: 2017-04-29 | Disposition: A | Discharge status: Home / Self Care | Payer: Self-pay | Attending: Internal Medicine | Admitting: Internal Medicine

## 2017-04-29 ENCOUNTER — Observation Stay: Payer: Self-pay

## 2017-04-29 ENCOUNTER — Telehealth: Payer: Self-pay | Admitting: *Deleted

## 2017-04-29 DIAGNOSIS — Z91148 Patient's other noncompliance with medication regimen for other reason: Secondary | ICD-10-CM

## 2017-04-29 DIAGNOSIS — J439 Emphysema, unspecified: Secondary | ICD-10-CM

## 2017-04-29 DIAGNOSIS — E782 Mixed hyperlipidemia: Secondary | ICD-10-CM

## 2017-04-29 DIAGNOSIS — R42 Dizziness and giddiness: Secondary | ICD-10-CM

## 2017-04-29 DIAGNOSIS — R748 Abnormal levels of other serum enzymes: Secondary | ICD-10-CM

## 2017-04-29 DIAGNOSIS — I351 Nonrheumatic aortic (valve) insufficiency: Secondary | ICD-10-CM

## 2017-04-29 DIAGNOSIS — I5022 Chronic systolic (congestive) heart failure: Secondary | ICD-10-CM

## 2017-04-29 DIAGNOSIS — Z720 Tobacco use: Secondary | ICD-10-CM

## 2017-04-29 DIAGNOSIS — N189 Chronic kidney disease, unspecified: Secondary | ICD-10-CM

## 2017-04-29 DIAGNOSIS — Z9114 Patient's other noncompliance with medication regimen: Secondary | ICD-10-CM

## 2017-04-29 DIAGNOSIS — N179 Acute kidney failure, unspecified: Principal | ICD-10-CM

## 2017-04-29 LAB — ECHOCARDIOGRAM COMPLETE
Height: 72 in
Weight: 4160 oz

## 2017-04-29 LAB — GLUCOSE, CAPILLARY
Glucose-Capillary: 212 mg/dL — ABNORMAL HIGH (ref 65–99)
Glucose-Capillary: 169 mg/dL — ABNORMAL HIGH (ref 65–99)

## 2017-04-29 LAB — BASIC METABOLIC PANEL
Anion gap: 9 (ref 5–15)
BUN: 37 mg/dL — ABNORMAL HIGH (ref 6–20)
CO2: 24 mmol/L (ref 22–32)
Calcium: 9.1 mg/dL (ref 8.9–10.3)
Chloride: 103 mmol/L (ref 101–111)
Creatinine, Ser: 1.6 mg/dL — ABNORMAL HIGH (ref 0.61–1.24)
GFR calc Af Amer: 57 mL/min — ABNORMAL LOW (ref >60)
GFR calc non Af Amer: 49 mL/min — ABNORMAL LOW (ref >60)
Glucose, Bld: 226 mg/dL — ABNORMAL HIGH (ref 65–99)
Potassium: 4.4 mmol/L (ref 3.5–5.1)
Sodium: 136 mmol/L (ref 135–145)

## 2017-04-29 LAB — TROPONIN I: Troponin I: 0.04 ng/mL (ref <0.03)

## 2017-04-29 LAB — CBC
HCT: 45.2 % (ref 40.0–52.0)
Hemoglobin: 15.7 g/dL (ref 13.0–18.0)
MCH: 30.4 pg (ref 26.0–34.0)
MCHC: 34.8 g/dL (ref 32.0–36.0)
MCV: 87.3 fL (ref 80.0–100.0)
Platelets: 173 10*3/uL (ref 150–440)
RBC: 5.18 MIL/uL (ref 4.40–5.90)
RDW: 15.2 % — ABNORMAL HIGH (ref 11.5–14.5)
WBC: 12.6 10*3/uL — ABNORMAL HIGH (ref 3.8–10.6)

## 2017-04-29 MED ORDER — SACUBITRIL-VALSARTAN 24-26 MG PO TABS: 1.0000 | ORAL_TABLET | Freq: Two times a day (BID) | ORAL | Status: DC

## 2017-04-29 MED ORDER — TORSEMIDE 20 MG PO TABS: 40.0000 mg | ORAL_TABLET | Freq: Every day | ORAL | Status: DC

## 2017-04-29 MED ORDER — SACUBITRIL-VALSARTAN 24-26 MG PO TABS: 1.0000 | ORAL_TABLET | Freq: Two times a day (BID) | ORAL | 0 refills | Status: DC

## 2017-04-29 MED ORDER — TORSEMIDE 20 MG PO TABS
40.0000 mg | ORAL_TABLET | Freq: Every day | ORAL | 0 refills | Status: DC
Start: 2017-04-29 — End: 2017-05-01

## 2017-04-29 MED ORDER — METOPROLOL SUCCINATE ER 25 MG PO TB24: 25.0000 mg | ORAL_TABLET | Freq: Every day | ORAL | 0 refills | Status: DC

## 2017-04-29 NOTE — Discharge Instructions (Signed)
Heart healthy and ADA diet. °Smoking cessation. °

## 2017-04-29 NOTE — Telephone Encounter (Signed)
Patient contacted regarding discharge from [facility] on 04/29/17.   Patient understands to follow up with provider ? On 05/12/17 at 2:20 pm at [facility] with Dr Rockey Situ.  Patient understands discharge instructions? Yes  Patient understands medications and regiment? Yes   Patient understands to bring all medications to this visit? Yes

## 2017-04-29 NOTE — Progress Notes (Signed)
Inpatient Diabetes Program Recommendations  AACE/ADA: New Consensus Statement on Inpatient Glycemic Control (2015)  Target Ranges:  Prepandial:   less than 140 mg/dL      Peak postprandial:   less than 180 mg/dL (1-2 hours)      Critically ill patients:  140 - 180 mg/dL   Lab Results  Component Value Date   GLUCAP 212 (H) 04/29/2017   HGBA1C 7.1 (H) 04/28/2017    Review of Glycemic ControlResults for WIRT, HEMMERICH (MRN [id number]) as of 04/29/2017 09:19  Ref. Range 04/28/2017 14:25 04/28/2017 17:12 04/28/2017 21:03 04/29/2017 07:55  Glucose-Capillary Latest Ref Range: 65 - 99 mg/dL 268 (H) 152 (H) 250 (H) 212 (H)   Diabetes history: Diabetes Mellitus Outpatient Diabetes medications: None Current orders for Inpatient glycemic control:  Novolog moderate tid with meals and HS, Medrol 8 mg daily taper Inpatient Diabetes Program Recommendations:    May consider adding Lantus 15 units daily while patient is in the hospital.  Note that patient's A1C increased.  May consider adding oral agent for diabetes at d/c or have patient follow-up with PCP regarding starting diabetes medication.  Thanks, Adah Perl, RN, BC-ADM Inpatient Diabetes Coordinator Pager [phone] (8a-5p)

## 2017-04-29 NOTE — Discharge Summary (Signed)
[facility] NAME: Quintell Bonnin    MR#:  [id number]  DATE OF BIRTH:  1970/10/23  DATE OF ADMISSION:  04/28/2017   ADMITTING PHYSICIAN: Gladstone Lighter, MD  DATE OF DISCHARGE: 04/29/2017 PRIMARY CARE PHYSICIAN: [facility] [place]   ADMISSION DIAGNOSIS:  Acute renal failure, unspecified acute renal failure type ([place]) [N17.9] DISCHARGE DIAGNOSIS:  Principal Problem:   Orthostatic hypotension Active Problems:   HTN (hypertension)   Hyperlipidemia   COPD (chronic obstructive pulmonary disease) (HCC)   Elevated troponin   Chronic systolic heart failure (HCC)   Atypical chest pain   Coronary artery disease, non-occlusive   Tobacco use   Noncompliance with medications  SECONDARY DIAGNOSIS:   Past Medical History:  Diagnosis Date  . Chest wall pain, chronic   . Chronic systolic CHF (congestive heart failure) ([place])    a. 03/2015 Echo: EF 45-50%; b. 12/2015 Echo: EF 20-25%; c. 02/2016 Echo: EF 30-35%; d. 11/2016 Echo: EF 40-45%.  . Chronic Troponin Elevation   . CKD (chronic kidney disease), stage III   . COPD (chronic obstructive pulmonary disease) ([place])   . Hypertension    Resolved since weight loss  . NICM (nonischemic cardiomyopathy) ([place])    a. 03/2015 Echo: EF 45-50%; b. 04/2015 MV: small defect of mild severity in apex 2/2 apical thinning, EF 30-44%;  c. 12/2015 Echo: EF 20-25%; d. 02/2016 Echo: EF 30-35%; e. 11/2016 Echo: EF 40-45%, diff HK, mildly dil Ao root, mild MR, mod dil LA;  f. 12/2016 Cath: LM nl, LAD/Diags/LCX/OMs min irregs, RCA 40p/m/d.  . Non-obstructive CAD (coronary artery disease)    a. 04/2015 low risk MV;  b. 12/2016 Cath: minor irregs in LAD/Diag/LCX/OM, RCA 40p/m/d.  [name] (nonsustained ventricular tachycardia) ([place])    a. 12/2015 noted on tele-->amio;  b. 12/2015 Event monitor: no VT. Atach noted.  . Obesity (BMI 30.0-34.9)   . Psoriasis   . Syncope    a. 01/2016 - felt to be vasovagal.  .  Tobacco abuse    HOSPITAL COURSE:   Dallis Czaja  is a 46 y.o. male with a known history of non-ischemic cardiomyopathy with most recent ejection fraction of 40%, COPD not on home oxygen, ongoing smoking, sleep apnea not on CPAP, diet-controlled diabetes mellitus, obesity, CKD, chronically elevated troponin secondary to CK D, medication noncompliance, adjustment disorder with depression and anxiety presents to hospital secondary to a near syncopal episode today.  #1 syncope-secondary to vasovagal, hypovolemia causing orthostatic hypotension. His blood pressure medications were on hold. Gentle hydration. -Carotid Dopplers: Mild right carotid bifurcation atherosclerotic vascular plaque. No flow limiting stenosis. Degree of stenosis less than 50%. and echocardiogram: LV EF: 45% -   50%.  #2 acute renal failure- has baseline CK D stage 2, baseline creatinine around 1.5. -Creatinine is improving at 1.6 with IV NS.  Hold torsemide, Aldactone.  #3 diabetes mellitus A1c 7.1. On sliding scale insulin. Follow-up PCP.  #4 lower back pain with lumbar [place] you were symptoms-started on Medrol dose pack. -Pain medications as needed. Outpatient follow-up with a physiatrist - lumbar spine x-rays done for the same about 2 weeks ago showing scoliosis and diffuse degenerative changes.  #5 chronic nonischemic cardiomyopathy with systolic heart failure-most recent ejection fraction was around 40%. Diuretics on hold.  Dr. Saunders Revel recommend lower dose Entresto to starting dose and reducing torsemide 40 mg daily with close monitoring of breathing, weights, and swelling -Decrease Toprol XL to 25 mg daily -Restart  spironolactone as an outpatient.  Tobacco abuse. Smoking cessation was counseled for 3-4 minutes. I discussed with Dr. Saunders Revel. DISCHARGE CONDITIONS:  Stable, discharge to home today. CONSULTS OBTAINED:  Treatment Team:  Nelva Bush, MD DRUG ALLERGIES:   Allergies  Allergen Reactions    . Prednisone Other (See Comments)    Reaction: Hallucinations    DISCHARGE MEDICATIONS:   Allergies as of 04/29/2017      Reactions   Prednisone Other (See Comments)   Reaction: Hallucinations      Medication List    STOP taking these medications   potassium chloride SA 20 MEQ tablet Commonly known as:  K-DUR,KLOR-CON   sacubitril-valsartan 97-103 MG Commonly known as:  ENTRESTO Replaced by:  sacubitril-valsartan 24-26 MG     TAKE these medications   amiodarone 200 MG tablet Commonly known as:  PACERONE Take 1 tablet (200 mg total) by mouth 2 (two) times daily.   aspirin 81 MG chewable tablet Chew 1 tablet (81 mg total) by mouth daily. Reported on 02/08/2016   metoprolol succinate 25 MG 24 hr tablet Commonly known as:  TOPROL-XL Take 1 tablet (25 mg total) by mouth daily. Take with or immediately following a meal. What changed:  medication strength  how much to take   rosuvastatin 20 MG tablet Commonly known as:  CRESTOR Take 1 tablet (20 mg total) by mouth daily.   sacubitril-valsartan 24-26 MG Commonly known as:  ENTRESTO Take 1 tablet by mouth 2 (two) times daily. Replaces:  sacubitril-valsartan 97-103 MG   spironolactone 25 MG tablet Commonly known as:  ALDACTONE Take 1 tablet (25 mg total) by mouth daily.   torsemide 20 MG tablet Commonly known as:  DEMADEX Take 2 tablets (40 mg total) by mouth daily. What changed:  how much to take            Discharge Care Instructions        Start     Ordered   04/29/17 0000  Increase activity slowly     04/29/17 1046   04/29/17 0000  Diet - low sodium heart healthy     04/29/17 1046   04/29/17 0000  metoprolol succinate (TOPROL-XL) 25 MG 24 hr tablet  Daily     04/29/17 1213   04/29/17 0000  torsemide (DEMADEX) 20 MG tablet  Daily     04/29/17 1213   04/29/17 0000  sacubitril-valsartan (ENTRESTO) 24-26 MG  2 times daily     04/29/17 1215       DISCHARGE INSTRUCTIONS:  See AVS. If you  experience worsening of your admission symptoms, develop shortness of breath, life threatening emergency, suicidal or homicidal thoughts you must seek medical attention immediately by calling 911 or calling your MD immediately  if symptoms less severe.  You Must read complete instructions/literature along with all the possible adverse reactions/side effects for all the Medicines you take and that have been prescribed to you. Take any new Medicines after you have completely understood and accpet all the possible adverse reactions/side effects.   Please note  You were cared for by a hospitalist during your hospital stay. If you have any questions about your discharge medications or the care you received while you were in the hospital after you are discharged, you can call the unit and asked to speak with the hospitalist on call if the hospitalist that took care of you is not available. Once you are discharged, your primary care physician will handle any further medical issues. Please note that NO  REFILLS for any discharge medications will be authorized once you are discharged, as it is imperative that you return to your primary care physician (or establish a relationship with a primary care physician if you do not have one) for your aftercare needs so that they can reassess your need for medications and monitor your lab values.    On the day of Discharge:  VITAL SIGNS:  Blood pressure (!) 143/78, pulse (!) 57, temperature 98.4 F (36.9 C), temperature source Oral, resp. rate 18, height 6' (1.829 m), weight 260 lb (117.9 kg), SpO2 92 %. PHYSICAL EXAMINATION:  GENERAL:  46 y.o.-year-old patient lying in the bed with no acute distress. Obesity. EYES: Pupils equal, round, reactive to light and accommodation. No scleral icterus. Extraocular muscles intact.  HEENT: Head atraumatic, normocephalic. Oropharynx and nasopharynx clear.  NECK:  Supple, no jugular venous distention. No thyroid enlargement, no  tenderness.  LUNGS: Normal breath sounds bilaterally, no wheezing, rales,rhonchi or crepitation. No use of accessory muscles of respiration.  CARDIOVASCULAR: S1, S2 normal. No murmurs, rubs, or gallops.  ABDOMEN: Soft, non-tender, non-distended. Bowel sounds present. No organomegaly or mass.  EXTREMITIES: No pedal edema, cyanosis, or clubbing.  NEUROLOGIC: Cranial nerves II through XII are intact. Muscle strength 5/5 in all extremities. Sensation intact. Gait not checked.  PSYCHIATRIC: The patient is alert and oriented x 3.  SKIN: No obvious rash, lesion, or ulcer.  DATA REVIEW:   CBC  Recent Labs Lab 04/29/17 0515  WBC 12.6*  HGB 15.7  HCT 45.2  PLT 173    Chemistries   Recent Labs Lab 04/28/17 1410 04/29/17 0515  NA 134* 136  K 3.8 4.4  CL 100* 103  CO2 20* 24  GLUCOSE 256* 226*  BUN 40* 37*  CREATININE 2.75* 1.60*  CALCIUM 9.6 9.1  AST 26  --   ALT 32  --   ALKPHOS 78  --   BILITOT 2.7*  --      Microbiology Results  Results for orders placed or performed during the hospital encounter of 03/15/16  CULTURE, BLOOD (ROUTINE X 2) w Reflex to ID Panel     Status: None   Collection Time: 03/15/16  5:03 AM  Result Value Ref Range Status   Specimen Description BLOOD LEFT HAND  Final   Special Requests   Final    BOTTLES DRAWN AEROBIC AND ANAEROBIC AER 6ML ANA 10ML   Culture NO GROWTH 5 DAYS  Final   Report Status 03/20/2016 FINAL  Final  CULTURE, BLOOD (ROUTINE X 2) w Reflex to ID Panel     Status: None   Collection Time: 03/15/16  5:03 AM  Result Value Ref Range Status   Specimen Description BLOOD LEFT AC  Final   Special Requests   Final    BOTTLES DRAWN AEROBIC AND ANAEROBIC AER 14ML ANA 6ML   Culture NO GROWTH 5 DAYS  Final   Report Status 03/20/2016 FINAL  Final  MRSA PCR Screening     Status: None   Collection Time: 03/15/16 12:27 PM  Result Value Ref Range Status   MRSA by PCR NEGATIVE NEGATIVE Final    Comment:        The GeneXpert MRSA Assay  (FDA approved for NASAL specimens only), is one component of a comprehensive MRSA colonization surveillance program. It is not intended to diagnose MRSA infection nor to guide or monitor treatment for MRSA infections.     RADIOLOGY:  US Carotid Bilateral  Result Date: 04/29/2017 CLINICAL DATA:  Syncope. EXAM: BILATERAL CAROTID DUPLEX ULTRASOUND TECHNIQUE: Pearline Cables scale imaging, color Doppler and duplex ultrasound were performed of bilateral carotid and vertebral arteries in the neck. COMPARISON:  01/23/2016. FINDINGS: Criteria: Quantification of carotid stenosis is based on velocity parameters that correlate the residual internal carotid diameter with NASCET-based stenosis levels, using the diameter of the distal internal carotid lumen as the denominator for stenosis measurement. The following velocity measurements were obtained: RIGHT ICA:  74/13 cm/sec CCA:  92/42 cm/sec SYSTOLIC ICA/CCA RATIO:  0.8 DIASTOLIC ICA/CCA RATIO:  0.8 ECA:  137 cm/sec LEFT ICA:  72/19 cm/sec CCA:  683/41 cm/sec SYSTOLIC ICA/CCA RATIO:  0.7 DIASTOLIC ICA/CCA RATIO:  1.3 ECA:  81 cm/sec RIGHT CAROTID ARTERY: Mild right carotid bifurcation atherosclerotic vascular plaque. No flow limiting stenosis. RIGHT VERTEBRAL ARTERY:  Patent with antegrade flow. LEFT CAROTID ARTERY:  No significant abnormality identified. LEFT VERTEBRAL ARTERY:  Patent with antegrade flow. IMPRESSION: 1. Mild right carotid bifurcation atherosclerotic vascular plaque. No flow limiting stenosis. Degree of stenosis less than 50%. 2. Left carotid widely patent. No significant atherosclerotic vascular disease. 3.  Vertebral artery is patent with antegrade flow . Electronically Signed   By: Marcello Moores  Register   On: 04/29/2017 10:47   Dg Chest Portable 1 View  Result Date: 04/28/2017 CLINICAL DATA:  Weakness. EXAM: PORTABLE CHEST 1 VIEW COMPARISON:  Radiographs of January 28, 2017. FINDINGS: The heart size and mediastinal contours are within normal limits. Both  lungs are clear. No pneumothorax or pleural effusion is noted. The visualized skeletal structures are unremarkable. IMPRESSION: No acute cardiopulmonary abnormality seen. Electronically Signed   By: Marijo Conception, M.D.   On: 04/28/2017 14:56     Management plans discussed with the patient, family and they are in agreement.  CODE STATUS: Full Code   TOTAL TIME TAKING CARE OF THIS PATIENT: 33 minutes.    Demetrios Loll M.D on 04/29/2017 at 2:36 PM  Between 7am to 6pm - Pager - [phone]  After 6pm go to [email or web address] - [employer] [facility]  Office  [phone]  CC: Primary care physician; [facility]   Note: This dictation was prepared with Dragon dictation along with smaller phrase technology. Any transcriptional errors that result from this process are unintentional.

## 2017-04-29 NOTE — Telephone Encounter (Signed)
-----   Message from Blain Pais sent at 04/29/2017  1:16 PM EDT ----- Regarding: TCM/PH 9/18 2 PM DR END

## 2017-04-29 NOTE — Plan of Care (Signed)
Problem: Pain Managment: Goal: General experience of comfort will improve Outcome: Progressing RN will administer prn pain medication as prescribed at patient request.

## 2017-04-29 NOTE — Consult Note (Signed)
Cardiology Consultation Note  Patient ID: Gabriel [name]., MRN: [id number], DOB/AGE: 27-Jul-1971 46 y.o. Admit date: 04/28/2017   Date of Consult: 04/29/2017 Primary Physician: [name] Primary Cardiologist: [name], MD Requesting Physician: [name], MD  Chief Complaint: LOC Reason for Consult: Patient request  HPI: Gabriel [name]. is a 46 y.o. male who is being seen today for the evaluation of syncope in the setting of orthostasis per patient request at the request of [name], MD. Patient has a h/o chronic systolic CHF due to [facility] with cardiac cath in 12/2016 showing nonobstructive CAD, medication noncompliance, COPD, CKD stage III, ongoing tobacco abuse,  prior Neisseria meningitis PNA and bacteremia in 12/2015, vasovagal syncope, and obesity, HTN, chronic chest wall pain, and NSVT who presented to [facility] on 9/10 with LOC felt to be 2/2 orthostasis. Cardiology asked to see the patient at his request.   Patient was previously admitted in August 2016 and September 2016 with atypical chest pain. Echo during August 2016 admission showed mildly reduced LV systolic function with EF of 45-50%, GR1DD, mild MR, mildly dilated left atrium read by outside group. He was noted to have a mildly elevated troponin and BNP. He had been hypertensive and unable to afford his medications at that time. He was discharged back in August 2016 on antihypertensives, but did not take them. During his September 2016 admission for atypical chest pain and HTN he underwent nuclear stress testing that showed a small defect of mild severity in the apex location felt to be 2/2 apical thinning. EF was estimated at 30-44%. Overall, this was an intermediate risk scan. There was no evidence of significant perfusion abnormalities and he was felt to have NICM. Admitted in mid May 2017 with cough and SOB, found to have Neisseria meningitis PNA and bacteremia. Cardiology saw patient for elevated troponin in  the setting of septic shock. Echo showed an EF of 20-25%, diffuse HK, RWMA could not be excluded, mild MR, LA moderately dilated, RV systolic function was normal, PASP was normal. CT chest revealed no coronary calcifications. He had short runs of NSVT on tele. He was started on amiodarone as an outpatient in hospital follow up on 01/17/16. He wore a Holter monitor to evaluate for arrhythmia, which showed only atrial tach. Admitted June 2017 for syncope felt to be vasovagal. No arrhythmia seen on tele. In hospital follow up he had been noncompliant with medications. He reported fatigue, though was able to go swimming with friends and family. His medications were restarted. Echo from 02/29/16 showed an EF of 30-35%, diffuse HK, LV diastolic function was normal, mild AI/MR, PASP 47 mmHg. Admitted to [facility] 02/2016 with nonexertional chest pain in the setting of persistent RUL PNA. BNP 925, troponin 0.07. Symptoms were felt to be 2/2 persistent RUL PNA. He was gently diuresed. He never followed up as an outpatient. He was seen in the ED in 09/2016 for CAP. Labs at that time showed BNP 645, troponin 0.08-->0.09. CXR c/w PNA. He was treated with ABX and discharged from the ED. Admitted in 10/2016 for acute on chronic CHF. Cardiology was not consulted and he was diuresed by IM. Admitted again in 11/2016 with acute on chronic CHF leading to diuresis. Seen in ED later in 11/2016 for renal stone. Admitted 12/2016 with chest pain/SOb. Troponin bumped to 0.07. He underwent LHC that showed nonobstructive CAD. Medical therapy and compliance were advised. Seen in the ED in 01/2017 for chest pain with outpatient follow  up advised given recent negative cath and improved troponin from baseline. Seen again in the ED, this time at [facility], in late 01/2017 for chronic atypical chest pain. Nonacute workup with outpatient follow up advised. In follow up on 02/28/2017, he was not taking medications and had not taken any proactive steps to help himself in  acquiring medications.   He was admitted on 9/10 with presyncope with positional changes that was worse on 9/10. He was noted to have ARF on labs in the ED c/w dehydration (SCr 2.75 with a baseline of 1.5-1.6). He was given IV fluids and admitted. Orthostatic vital signs were negative on 9/10. Troponin has been minimally elevated with a peak of 0.06 (has chronically elevated troponin). WIth IV fluids his renal function has improved to 1.60 (his baseline). CXR negative. Carotid ultrasound without hemodynamically significant stenosis. Echo is pending. Telemetry has not shown any significant arrhythmia or pause. Vitals have been stable. He was continued on only amiodarone upon admission. PTA medications include Entresto, Toprol, spironolactone, and torsemide. He reports he has been taking his home medications as directed without missing doses. Chest pain unchanged from his baseline chronic atypical chest pain.    Past Medical History:  Diagnosis Date  . Chest wall pain, chronic   . Chronic systolic CHF (congestive heart failure) ([place])    a. 03/2015 Echo: EF 45-50%; b. 12/2015 Echo: EF 20-25%; c. 02/2016 Echo: EF 30-35%; d. 11/2016 Echo: EF 40-45%.  . Chronic Troponin Elevation   . CKD (chronic kidney disease), stage III   . COPD (chronic obstructive pulmonary disease) ([place])   . Hypertension    Resolved since weight loss  . NICM (nonischemic cardiomyopathy) ([place])    a. 03/2015 Echo: EF 45-50%; b. 04/2015 MV: small defect of mild severity in apex 2/2 apical thinning, EF 30-44%;  c. 12/2015 Echo: EF 20-25%; d. 02/2016 Echo: EF 30-35%; e. 11/2016 Echo: EF 40-45%, diff HK, mildly dil Ao root, mild MR, mod dil LA;  f. 12/2016 Cath: LM nl, [name]/Diags/LCX/OMs min irregs, RCA 40p/m/d.  . Non-obstructive CAD (coronary artery disease)    a. 04/2015 low risk MV;  b. 12/2016 Cath: minor irregs in [name]/Diag/LCX/OM, RCA 40p/m/d.  [name] (nonsustained ventricular tachycardia) ([place])    a. 12/2015 noted on tele-->amio;  b. 12/2015  Event monitor: no VT. Atach noted.  . Obesity (BMI 30.0-34.9)   . Psoriasis   . Syncope    a. 01/2016 - felt to be vasovagal.  . Tobacco abuse       Most Recent Cardiac Studies: LHC 01/06/2017: Conclusion     There is moderate to severe left ventricular systolic dysfunction.  LV end diastolic pressure is moderately elevated.  The left ventricular ejection fraction is 25-35% by visual estimate.  Dist RCA lesion, 40 %stenosed.  Prox RCA to Mid RCA lesion, 40 %stenosed.   1. Mild nonobstructive coronary artery disease. 2. Moderately to severely reduced LV systolic function with an EF of 30-35%. 3. Moderately elevated left ventricular end-diastolic pressure. LVEDP was in the high 20s.  Recommendations: Continue medical therapy for nonobstructive coronary artery disease. The patient has nonischemic cardiomyopathy. He is volume overloaded and I increased torsemide to 40 mg by mouth twice daily. Consider adding spironolactone or eplerenone in the near future.   TTE 11/2016: Study Conclusions  - Left ventricle: The cavity size was mildly dilated. There was   mild concentric hypertrophy. Systolic function was mildly to   moderately reduced. The estimated ejection fraction was in the  range of 40% to 45%. Diffuse hypokinesis. Left ventricular   diastolic function parameters were normal. - Aortic root: The aortic root was mildly dilated. - Mitral valve: There was mild regurgitation. - Left atrium: The atrium was moderately dilated. - Pulmonary arteries: Systolic pressure could not be accurately   estimated. - Inferior vena cava: The vessel was dilated. The respirophasic   diameter changes were blunted (< 50%), consistent with elevated   central venous pressure.  Impressions:  - Challenging image quality.   Surgical History:  Past Surgical History:  Procedure Laterality Date  . AMPUTATION    . FINGER AMPUTATION     Traumatic  . LEFT HEART CATH AND CORONARY ANGIOGRAPHY  N/A 01/06/2017   Procedure: Left Heart Cath and Coronary Angiography;  Surgeon: Wellington Hampshire, MD;  Location: [facility];  Service: Cardiovascular;  Laterality: N/A;     Home Meds: Prior to Admission medications   Medication Sig Start Date End Date Taking? Authorizing Provider  amiodarone (PACERONE) 200 MG tablet Take 1 tablet (200 mg total) by mouth 2 (two) times daily. 02/28/17  Yes Minna Merritts, MD  aspirin 81 MG chewable tablet Chew 1 tablet (81 mg total) by mouth daily. Reported on 02/08/2016 09/19/16  Yes Earleen Newport, MD  metoprolol succinate (TOPROL-XL) 50 MG 24 hr tablet Take 1 tablet (50 mg total) by mouth daily. Take with or immediately following a meal. 02/28/17  Yes Gollan, Kathlene November, MD  potassium chloride SA (K-DUR,KLOR-CON) 20 MEQ tablet Take 1 tablet (20 mEq total) by mouth daily. 02/28/17  Yes Minna Merritts, MD  rosuvastatin (CRESTOR) 20 MG tablet Take 1 tablet (20 mg total) by mouth daily. 02/28/17 05/29/17 Yes Gollan, Kathlene November, MD  sacubitril-valsartan (ENTRESTO) 97-103 MG Take 1 tablet by mouth 2 (two) times daily. 03/13/17  Yes Minna Merritts, MD  spironolactone (ALDACTONE) 25 MG tablet Take 1 tablet (25 mg total) by mouth daily. 02/28/17  Yes Minna Merritts, MD  torsemide (DEMADEX) 20 MG tablet Take 3 tablets (60 mg total) by mouth daily. 02/28/17  Yes Minna Merritts, MD    Inpatient Medications:  . amiodarone  200 mg Oral BID  . aspirin  81 mg Oral Daily  . heparin  5,000 Units Subcutaneous Q8H  . insulin aspart  0-15 Units Subcutaneous TID WC  . insulin aspart  0-5 Units Subcutaneous QHS  . [START ON 04/30/2017] methylPREDNISolone  8 mg Oral Daily   Followed by  . [START ON 05/02/2017] methylPREDNISolone  4 mg Oral Daily  . nicotine  14 mg Transdermal Daily  . rosuvastatin  20 mg Oral Daily   . sodium chloride 60 mL/hr at 04/28/17 1753    Allergies:  Allergies  Allergen Reactions  . Prednisone Other (See Comments)    Reaction:  Hallucinations     Social History   Social History  . Marital status: Widowed    Spouse name: N/A  . Number of children: N/A  . Years of education: N/A   Occupational History  . Not on file.   Social History Main Topics  . Smoking status: Current Some Day Smoker    Packs/day: 0.50    Years: 33.00    Types: Cigarettes  . Smokeless tobacco: Never Used     Comment: currently smoking a few cigs/day.  . Alcohol use 1.8 oz/week    3 Cans of beer per week     Comment: occassionally  . Drug use: No  . Sexual  activity: Not on file   Other Topics Concern  . Not on file   Social History Narrative   Independent        Family History  Problem Relation Age of Onset  . Diabetes Mellitus II Mother   . Hypertension Father   . Cancer Paternal Aunt   . Cancer Maternal Grandfather      Review of Systems: Review of Systems  Constitutional: Positive for malaise/fatigue. Negative for chills, diaphoresis, fever and weight loss.  HENT: Negative for congestion.   Eyes: Negative for discharge and redness.  Respiratory: Negative for cough, hemoptysis, sputum production, shortness of breath and wheezing.   Cardiovascular: Negative for chest pain, palpitations, orthopnea, claudication, leg swelling and PND.  Gastrointestinal: Negative for abdominal pain, blood in stool, heartburn, melena, nausea and vomiting.  Genitourinary: Negative for hematuria.  Musculoskeletal: Positive for back pain, joint pain and myalgias. Negative for falls.  Skin: Negative for rash.  Neurological: Positive for dizziness and weakness. Negative for tingling, tremors, sensory change, speech change, focal weakness, seizures, loss of consciousness and headaches.  Endo/Heme/Allergies: Does not bruise/bleed easily.  Psychiatric/Behavioral: Negative for substance abuse. The patient is not nervous/anxious.   All other systems reviewed and are negative.   Labs:  Recent Labs  04/28/17 1410 04/28/17 1702  04/28/17 2250 04/29/17 0515  TROPONINI 0.06* 0.04* 0.04* 0.04*   Lab Results  Component Value Date   WBC 12.6 (H) 04/29/2017   HGB 15.7 04/29/2017   HCT 45.2 04/29/2017   MCV 87.3 04/29/2017   PLT 173 04/29/2017     Recent Labs Lab 04/28/17 1410 04/29/17 0515  NA 134* 136  K 3.8 4.4  CL 100* 103  CO2 20* 24  BUN 40* 37*  CREATININE 2.75* 1.60*  CALCIUM 9.6 9.1  PROT 8.2*  --   BILITOT 2.7*  --   ALKPHOS 78  --   ALT 32  --   AST 26  --   GLUCOSE 256* 226*   Lab Results  Component Value Date   CHOL 74 01/04/2016   HDL 14 (L) 01/04/2016   LDLCALC 46 01/04/2016   TRIG 71 01/04/2016   No results found for: DDIMER  Radiology/Studies:  Dg Lumbar Spine 2-3 Views  Result Date: 04/10/2017 CLINICAL DATA:  Low back pain radiating down both legs. No known injury. EXAM: LUMBAR SPINE - 2-3 VIEW COMPARISON:  CT 12/06/2016 . FINDINGS: Mild lumbar spine scoliosis. Diffuse degenerative change. No acute bony abnormality identified. Aortoiliac atherosclerotic vascular disease. IMPRESSION: 1. Mild lumbar spine scoliosis. Diffuse degenerative change. No acute bony abnormality identified . 2. Aortoiliac atherosclerotic vascular disease. Electronically Signed   By: Marcello Moores  Register   On: 04/10/2017 09:10   US Carotid Bilateral  Result Date: 04/29/2017 CLINICAL DATA:  Syncope. EXAM: BILATERAL CAROTID DUPLEX ULTRASOUND TECHNIQUE: Pearline Cables scale imaging, color Doppler and duplex ultrasound were performed of bilateral carotid and vertebral arteries in the neck. COMPARISON:  01/23/2016. FINDINGS: Criteria: Quantification of carotid stenosis is based on velocity parameters that correlate the residual internal carotid diameter with NASCET-based stenosis levels, using the diameter of the distal internal carotid lumen as the denominator for stenosis measurement. The following velocity measurements were obtained: RIGHT ICA:  74/13 cm/sec CCA:  73/22 cm/sec SYSTOLIC ICA/CCA RATIO:  0.8 DIASTOLIC ICA/CCA  RATIO:  0.8 ECA:  137 cm/sec LEFT ICA:  72/19 cm/sec CCA:  025/42 cm/sec SYSTOLIC ICA/CCA RATIO:  0.7 DIASTOLIC ICA/CCA RATIO:  1.3 ECA:  81 cm/sec RIGHT CAROTID ARTERY: Mild right carotid bifurcation atherosclerotic  vascular plaque. No flow limiting stenosis. RIGHT VERTEBRAL ARTERY:  Patent with antegrade flow. LEFT CAROTID ARTERY:  No significant abnormality identified. LEFT VERTEBRAL ARTERY:  Patent with antegrade flow. IMPRESSION: 1. Mild right carotid bifurcation atherosclerotic vascular plaque. No flow limiting stenosis. Degree of stenosis less than 50%. 2. Left carotid widely patent. No significant atherosclerotic vascular disease. 3.  Vertebral artery is patent with antegrade flow . Electronically Signed   By: Marcello Moores  Register   On: 04/29/2017 10:47   Dg Chest Portable 1 View  Result Date: 04/28/2017 CLINICAL DATA:  Weakness. EXAM: PORTABLE CHEST 1 VIEW COMPARISON:  Radiographs of January 28, 2017. FINDINGS: The heart size and mediastinal contours are within normal limits. Both lungs are clear. No pneumothorax or pleural effusion is noted. The visualized skeletal structures are unremarkable. IMPRESSION: No acute cardiopulmonary abnormality seen. Electronically Signed   By: Marijo Conception, M.D.   On: 04/28/2017 14:56    EKG: Interpreted by me showed: NSR, 93 bpm, nonspecific lateral st/t changes Telemetry: Interpreted by me showed: NSR, no evidence of any ventricular ectopy or pauses  Weights: Filed Weights   04/28/17 1410  Weight: 260 lb (117.9 kg)     Physical Exam: Blood pressure (!) 143/78, pulse (!) 57, temperature 98.4 F (36.9 C), temperature source Oral, resp. rate 18, height 6' (1.829 m), weight 260 lb (117.9 kg), SpO2 92 %. Body mass index is 35.26 kg/m. General: Well developed, well nourished, in no acute distress. Head: Normocephalic, atraumatic, sclera non-icteric, no xanthomas, nares are without discharge.  Neck: Negative for carotid bruits. JVD not elevated. Lungs: Clear  bilaterally to auscultation without wheezes, rales, or rhonchi. Breathing is unlabored. Heart: RRR with S1 S2. No murmurs, rubs, or gallops appreciated. Abdomen: Soft, non-tender, non-distended with normoactive bowel sounds. No hepatomegaly. No rebound/guarding. No obvious abdominal masses. Msk:  Strength and tone appear normal for age. Extremities: No clubbing or cyanosis. No edema. Distal pedal pulses are 2+ and equal bilaterally. Neuro: Alert and oriented X 3. No facial asymmetry. No focal deficit. Moves all extremities spontaneously. Psych:  Responds to questions appropriately with a normal affect.    Assessment and Plan:  Principal Problem:   Orthostatic hypotension Active Problems:   COPD (chronic obstructive pulmonary disease) (HCC)   Noncompliance with medications   HTN (hypertension)   Coronary artery disease, non-occlusive   Tobacco use   Hyperlipidemia   Elevated troponin   Chronic systolic heart failure (HCC)   Atypical chest pain    1. Orthostatic hypotension: -Orthostatic vital signs checked after receiving IV fluids -No evidence of significant cardiac events noted on telemetry -Prior outpatient cardiac monitoring has been unrevealing  -Decrease CHF medications as below -Perhaps with compliance, he has become on the dry side -He denies any changes to his diet or decreased PO fluid intake -He does not weight himself or check his BP at home -TSH normal -Check magnesium given prior history with NSVT -Potassium at goal  2. Medication noncompliance presenting significant health hazards: -This is one of the patient's high concerning problems and driving the majority of his admissions and ED visits -Compliance is advised -He is not a candidate for any invasive/aggressive procedure without compliance  3. Nonocclusive CAD: -By recent LHC in 12/2016 -Conttine ASA, Toprol and Crestor -No plans for further inpatient ischemic evaluation   4. Chronic systolic CHF: -He  does not appear to be volume overloaded at this time -BNP normal, CXR not acute -Entresto, Toprol and spironolactone have been held upon admission -He  reports compliance with medications at home -Recommend lower dose Entresto to starting dose and reducing torsemide 40 mg daily with close monitoring of breathing, weights, and swelling -Decrease Toprol XL to 25 mg daily -Restart spironolactone as an outpatient -TTE pending  5. Atypical chest pain: -As above -Per IM  6. COPD with ongoing tobacco abuse: -Reports he cannot afford his medications, though buys cigarettes -Appropriate lifestyle choices advised  7. HTN: -Continue medications as above  8. HLD: -Crestor   9. Dispo: -Ambulated with PT without issues -Continues to ask for disability, defer to [name]    Signed, Christell Faith, PA-C [place] Pager: [phone] 04/29/2017, 11:26 AM

## 2017-04-29 NOTE — Progress Notes (Signed)
*  PRELIMINARY RESULTS* Echocardiogram 2D Echocardiogram has been performed.  Gabriel [name] 04/29/2017, 9:29 AM

## 2017-04-29 NOTE — Evaluation (Signed)
Physical Therapy Evaluation Patient Details Name: Gabriel [name]. MRN: [id number] DOB: [birth year] Today's Date: 04/29/2017   History of Present Illness   46 y.o. male with a known history of non-ischemic cardiomyopathy with most recent ejection fraction of 40%, COPD not on home oxygen, ongoing smoking, sleep apnea not on CPAP, diet-controlled diabetes mellitus, obesity, CKD, chronically elevated troponin secondary to CKD, medication noncompliance, adjustment disorder with depression and anxiety presents to hospital secondary to a near syncopal episode.    Clinical Impression  Pt is able to ambulate with good speed/safety but overall is hesitant and limited with his confidence.  Pt was able to negotiate up/down steps (UE/rail use and step-to strategy).  Pt reports that his PCP just took some imaging and told him he has some blockages to LEs (vascular claudication?).  Pt with some onset of R LE weakness/numbness with just a few minutes of standing/walking activity.  Pt is safe to return home but would benefit from outpatient PT if possible.   Follow Up Recommendations Outpatient PT (discussed [facility] with pt )    Equipment Recommendations       Recommendations for Other Services       Precautions / Restrictions Precautions Precautions: Fall Restrictions Weight Bearing Restrictions: No      Mobility  Bed Mobility Overal bed mobility: Modified Independent             General bed mobility comments: Pt is able to get himself sitting at EOB w/o difficulty  Transfers Overall transfer level: Independent Equipment used: None             General transfer comment: Pt is able to rise to standing w/o assist, showed good balance in standing  Ambulation/Gait Ambulation/Gait assistance: Modified independent (Device/Increase time) Ambulation Distance (Feet): 300 Feet Assistive device: None       General Gait Details: Pt with some mild limp on R (decreased knee bend, toe  out, hesitancy) but no LOBs or safety issues.   Stairs Stairs: Yes Stairs assistance: Modified independent (Device/Increase time) Stair Management: One rail Right Number of Stairs: 4 General stair comments: Pt with able to negotiate up/down steps w/o safety issue, however he did favor the R LE and needed to do step-to strategy up and down  Wheelchair Mobility    Modified Rankin (Stroke Patients Only)       Balance Overall balance assessment: Independent                                           Pertinent Vitals/Pain Pain Assessment: 0-10 Pain Score: 8  Pain Location: mostly centralized in low back , started having R>L LE pain/numbness after brief walk    Home Living Family/patient expects to be discharged to:: Private residence Living Arrangements: Parent Available Help at Discharge: Family   Home Access: Stairs to enter Entrance Stairs-Rails: Right Entrance Stairs-Number of Steps: 2 Home Layout: Two level        Prior Function Level of Independence: Independent         Comments: Pt reports that he no longer can do any prolonged activity, is layed up for 2-3 days post activity     Hand Dominance        Extremity/Trunk Assessment   Upper Extremity Assessment Upper Extremity Assessment: Overall WFL for tasks assessed    Lower Extremity Assessment Lower Extremity Assessment: Overall WFL for tasks  assessed (except b/l knee flexion weak (grossly 3/5))       Communication   Communication: No difficulties  Cognition Arousal/Alertness: Awake/alert Behavior During Therapy: WFL for tasks assessed/performed Overall Cognitive Status: Within Functional Limits for tasks assessed                                        General Comments      Exercises     Assessment/Plan    PT Assessment Patient needs continued PT services  PT Problem List Decreased strength;Decreased activity tolerance;Decreased mobility;Pain        PT Treatment Interventions Gait training;Stair training;Functional mobility training;Therapeutic activities;Therapeutic exercise;Balance training;Patient/family education    PT Goals (Current goals can be found in the Care Plan section)  Acute Rehab PT Goals Patient Stated Goal: be able to tolerate some prolonged activity PT Goal Formulation: With patient Time For Goal Achievement: 05/13/17 Potential to Achieve Goals: Good    Frequency Min 2X/week   Barriers to discharge        Co-evaluation               AM-PAC PT "6 Clicks" Daily Activity  Outcome Measure Difficulty turning over in bed (including adjusting bedclothes, sheets and blankets)?: None Difficulty moving from lying on back to sitting on the side of the bed? : None Difficulty sitting down on and standing up from a chair with arms (e.g., wheelchair, bedside commode, etc,.)?: None Help needed moving to and from a bed to chair (including a wheelchair)?: None Help needed walking in hospital room?: None Help needed climbing 3-5 steps with a railing? : None 6 Click Score: 24    End of Session Equipment Utilized During Treatment: Gait belt Activity Tolerance: Patient tolerated treatment well;Patient limited by pain Patient left: with call bell/phone within reach;with bed alarm set   PT Visit Diagnosis: Muscle weakness (generalized) (M62.81);Pain;Difficulty in walking, not elsewhere classified (R26.2) Pain - part of body: Leg (low back and b/l LEs)    Time: 5638-9373 PT Time Calculation (min) (ACUTE ONLY): 22 min   Charges:   PT Evaluation $PT Eval Low Complexity: 1 Low     PT G Codes:   PT G-Codes **NOT FOR INPATIENT CLASS** Functional Assessment Tool Used: Clinical judgement Functional Limitation: Mobility: Walking and moving around Mobility: Walking and Moving Around Current Status (S2876): At least 1 percent but less than 20 percent impaired, limited or restricted Mobility: Walking and Moving Around Goal  Status [phone]): 0 percent impaired, limited or restricted    Kreg Shropshire, DPT 04/29/2017, 8:54 AM

## 2017-05-01 ENCOUNTER — Encounter: Payer: Self-pay | Admitting: Family

## 2017-05-01 ENCOUNTER — Ambulatory Visit: Payer: Self-pay | Attending: Family | Admitting: Family

## 2017-05-01 VITALS — BP 160/93 | HR 86 | Resp 18 | Ht 72.0 in | Wt 260.5 lb

## 2017-05-01 DIAGNOSIS — I10 Essential (primary) hypertension: Secondary | ICD-10-CM

## 2017-05-01 DIAGNOSIS — E1122 Type 2 diabetes mellitus with diabetic chronic kidney disease: Secondary | ICD-10-CM | POA: Insufficient documentation

## 2017-05-01 DIAGNOSIS — F17210 Nicotine dependence, cigarettes, uncomplicated: Secondary | ICD-10-CM | POA: Insufficient documentation

## 2017-05-01 DIAGNOSIS — M5441 Lumbago with sciatica, right side: Secondary | ICD-10-CM

## 2017-05-01 DIAGNOSIS — Z7982 Long term (current) use of aspirin: Secondary | ICD-10-CM | POA: Insufficient documentation

## 2017-05-01 DIAGNOSIS — I130 Hypertensive heart and chronic kidney disease with heart failure and stage 1 through stage 4 chronic kidney disease, or unspecified chronic kidney disease: Secondary | ICD-10-CM | POA: Insufficient documentation

## 2017-05-01 DIAGNOSIS — M545 Low back pain: Secondary | ICD-10-CM | POA: Insufficient documentation

## 2017-05-01 DIAGNOSIS — Z89029 Acquired absence of unspecified finger(s): Secondary | ICD-10-CM | POA: Insufficient documentation

## 2017-05-01 DIAGNOSIS — J449 Chronic obstructive pulmonary disease, unspecified: Secondary | ICD-10-CM | POA: Insufficient documentation

## 2017-05-01 DIAGNOSIS — G8929 Other chronic pain: Secondary | ICD-10-CM

## 2017-05-01 DIAGNOSIS — N183 Chronic kidney disease, stage 3 (moderate): Secondary | ICD-10-CM | POA: Insufficient documentation

## 2017-05-01 DIAGNOSIS — Z79899 Other long term (current) drug therapy: Secondary | ICD-10-CM | POA: Insufficient documentation

## 2017-05-01 DIAGNOSIS — E119 Type 2 diabetes mellitus without complications: Secondary | ICD-10-CM

## 2017-05-01 DIAGNOSIS — I5022 Chronic systolic (congestive) heart failure: Secondary | ICD-10-CM | POA: Insufficient documentation

## 2017-05-01 DIAGNOSIS — I428 Other cardiomyopathies: Secondary | ICD-10-CM | POA: Insufficient documentation

## 2017-05-01 DIAGNOSIS — M5442 Lumbago with sciatica, left side: Secondary | ICD-10-CM

## 2017-05-01 LAB — GLUCOSE, CAPILLARY: Glucose-Capillary: 222 mg/dL — ABNORMAL HIGH (ref 65–99)

## 2017-05-01 MED ORDER — SACUBITRIL-VALSARTAN 24-26 MG PO TABS: 1.0000 | ORAL_TABLET | Freq: Two times a day (BID) | ORAL | 3 refills | Status: DC

## 2017-05-01 NOTE — Patient Instructions (Addendum)
Continue weighing daily and call for an overnight weight gain of > 2 pounds or a weekly weight gain of >5 pounds.  Resume all medications except spironolactone. Resume torsemide but only take 20mg  daily. Samples given for entresto 24/26mg  twice daily

## 2017-05-01 NOTE — Progress Notes (Addendum)
Agree with pharmacist note below.  Physical exam and ROS were done by myself.  Resume medications except spironolactone at this time with also decreasing his torsemide to 20mg  daily. Will send a new RX to medication management clinic for the low dose of entresto. Plan to titrate up as able his entresto and/or metoprolol succinate. May need to also resume spironolactone in the future.  He is going to speak with PCP regarding further work-up about lumbar xray results.  Check a BMP at his next visit.  [name], FNP HF Clinic at [facility]    Patient ID: Gabriel Righter., male    DOB: 06-20-1971, 46 y.o.   MRN: [id number]  HPI  Gabriel [name] is a 46 y/o male with a history of HTN, diabetes, COPD, CKD, asthma, current tobacco use and chronic heart failure.   Most recent echo done 04/29/17 showed EF 45-50%. Echo report that was done 11/28/16 and it showed an EF of 40-45% along with mild Gabriel. EF has improved from 30-35% July 2017.  Was in [facility] ED 04/28/17 due to a near syncope episode. Noted to have acute renal failure on chronic renal insufficiency and dehydration. All BP meds were held and gentle hydration given. Was in the [facility] ED 02/05/17 due to chest pain. Evaluated and discharged home. Was in the ED 01/28/17 for chest pain. Chronically elevated troponins. Discharged home. Last admission on 01/05/17 for an NSTEMI. Had elevated troponin and underwent cardiac catheterization. Was discharged home after 2 days on nitroglycerin and Crestor. Was in the ED 12/06/16 for a kidney stone. Evaluated and discharged home. Admitted 11/28/16 with HF exacerbation. Cardiology consult obtained. IV solu-medrol given for COPD exacerbation as well. Discharged home the next day. Admitted 10/28/16 with HF exacerbation. Initially treated with IV diuretics and transitioned to oral diuretics. Discharged home after 2 days. Has had numerous other admissions/ED visits in the last 6 months.  He presents today for a follow-up visit with a  chief complaint of dizziness, fatigue, and low back pain with radiation down both of his legs. He describes his back pain as having been present for almost two months now with no relief and APAP does not work. Twisted his back using an auger in the 1990's causing injury to his lower back but no recent injury. Has associated tingling/pain down the outside of both lower legs. Patient has not taken any medications since hospital discharge 04/29/17. Wanted to "make sure" all of the medications were correct at his doctor's appointments before taking any of them. Is currently in the process of applying for disability and medicaid. Also filling out [facility].   Past Medical History:  Diagnosis Date  . Chest wall pain, chronic   . Chronic systolic CHF (congestive heart failure) ([place])    a. 03/2015 Echo: EF 45-50%; b. 12/2015 Echo: EF 20-25%; c. 02/2016 Echo: EF 30-35%; d. 11/2016 Echo: EF 40-45%.  . Chronic Troponin Elevation   . CKD (chronic kidney disease), stage III   . COPD (chronic obstructive pulmonary disease) ([place])   . Hypertension    Resolved since weight loss  . NICM (nonischemic cardiomyopathy) ([place])    a. 03/2015 Echo: EF 45-50%; b. 04/2015 MV: small defect of mild severity in apex 2/2 apical thinning, EF 30-44%;  c. 12/2015 Echo: EF 20-25%; d. 02/2016 Echo: EF 30-35%; e. 11/2016 Echo: EF 40-45%, diff HK, mildly dil Ao root, mild Gabriel, mod dil LA;  f. 12/2016 Cath: LM nl, LAD/Diags/LCX/OMs min irregs, RCA 40p/m/d.  . Non-obstructive  CAD (coronary artery disease)    a. 04/2015 low risk MV;  b. 12/2016 Cath: minor irregs in LAD/Diag/LCX/OM, RCA 40p/m/d.  [name] (nonsustained ventricular tachycardia) ([place])    a. 12/2015 noted on tele-->amio;  b. 12/2015 Event monitor: no VT. Atach noted.  . Obesity (BMI 30.0-34.9)   . Psoriasis   . Syncope    a. 01/2016 - felt to be vasovagal.  . Tobacco abuse    Past Surgical History:  Procedure Laterality Date  . AMPUTATION    . FINGER AMPUTATION     Traumatic  .  LEFT HEART CATH AND CORONARY ANGIOGRAPHY N/A 01/06/2017   Procedure: Left Heart Cath and Coronary Angiography;  Surgeon: Wellington Hampshire, MD;  Location: [facility];  Service: Cardiovascular;  Laterality: N/A;   Family History  Problem Relation Age of Onset  . Diabetes Mellitus II Mother   . Hypertension Father   . Cancer Paternal Aunt   . Cancer Maternal Grandfather    Social History  Substance Use Topics  . Smoking status: Current Some Day Smoker    Packs/day: 0.50    Years: 33.00    Types: Cigarettes  . Smokeless tobacco: Never Used     Comment: currently smoking a few cigs/day.  . Alcohol use 1.8 oz/week    3 Cans of beer per week     Comment: occassionally   Allergies  Allergen Reactions  . Prednisone Other (See Comments)    Reaction: Hallucinations    Prior to Admission medications   Medication Sig Start Date End Date Taking? Authorizing Provider  amiodarone (PACERONE) 200 MG tablet Take 1 tablet (200 mg total) by mouth 2 (two) times daily. 02/28/17  Yes Minna Merritts, MD  aspirin 81 MG chewable tablet Chew 1 tablet (81 mg total) by mouth daily. Reported on 02/08/2016 09/19/16  Yes Earleen Newport, MD  metoprolol succinate (TOPROL-XL) 50 MG 24 hr tablet Take 1 tablet (50 mg total) by mouth daily. Take with or immediately following a meal. 02/28/17  Yes Gollan, Kathlene November, MD  potassium chloride SA (K-DUR,KLOR-CON) 20 MEQ tablet Take 1 tablet (20 mEq total) by mouth daily. 02/28/17  Yes Minna Merritts, MD  rosuvastatin (CRESTOR) 20 MG tablet Take 1 tablet (20 mg total) by mouth daily. 02/28/17 05/29/17 Yes Gollan, Kathlene November, MD  sacubitril-valsartan (ENTRESTO) 97-103 MG Take 1 tablet by mouth 2 (two) times daily. 03/13/17  Yes Minna Merritts, MD  torsemide (DEMADEX) 20 MG tablet Take 3 tablets (60 mg total) by mouth daily. 02/28/17  Yes Minna Merritts, MD    Review of Systems  Constitutional: Positive for fatigue. Negative for appetite change.  HENT:  Negative for congestion, postnasal drip and sore throat.   Eyes: Negative.   Respiratory: Positive for cough and shortness of breath. Negative for chest tightness.   Cardiovascular: Negative for chest pain, palpitations and leg swelling.  Gastrointestinal: Negative for abdominal distention and abdominal pain.  Endocrine: Negative.   Genitourinary: Negative.   Musculoskeletal: Positive for back pain (lower back). Negative for neck pain.  Skin: Negative.   Allergic/Immunologic: Negative.   Neurological: Positive for dizziness and numbness (in legs/tingling down legs). Negative for light-headedness.  Hematological: Negative for adenopathy. Does not bruise/bleed easily.  Psychiatric/Behavioral: Positive for sleep disturbance (due to pain down his legs). Negative for dysphoric mood and suicidal ideas. The patient is nervous/anxious (stress).    Vitals:   05/01/17 0952  BP: (!) 160/93  Pulse: 86  Resp: 18  SpO2: 97%  Weight: 260 lb 8 oz (118.2 kg)  Height: 6' (1.829 m)   Wt Readings from Last 3 Encounters:  05/01/17 260 lb 8 oz (118.2 kg)  04/28/17 260 lb (117.9 kg)  04/03/17 260 lb 2 oz (118 kg)    Lab Results  Component Value Date   CREATININE 1.60 (H) 04/29/2017   CREATININE 2.75 (H) 04/28/2017   CREATININE 1.07 04/03/2017   Physical Exam  Constitutional: He is oriented to person, place, and time. He appears well-developed and well-nourished.  HENT:  Head: Normocephalic and atraumatic.  Neck: Normal range of motion. Neck supple. No JVD present.  Cardiovascular: Normal rate and regular rhythm.   Pulmonary/Chest: Effort normal. He has no wheezes. He has no rales. He exhibits no tenderness.  Abdominal: Soft. He exhibits no distension. There is no tenderness.  Musculoskeletal: He exhibits tenderness. He exhibits no edema.       Lumbar back: He exhibits decreased range of motion, tenderness and pain. He exhibits no swelling.  Neurological: He is alert and oriented to person,  place, and time.  Skin: Skin is warm and dry.  Psychiatric: He has a normal mood and affect. His behavior is normal. Thought content normal.  Nursing note and vitals reviewed.   Assessment & Plan:  1: Chronic heart failure with reduced ejection fraction- - NYHA class II - euvolemic - weigh daily and call for an overnight weight gain of >2 pounds or a weekly weight gain of >5 pounds.  - not adding salt and he was encouraged to closely follow a 2000mg  sodium diet - Has not taken any medications since hospital discharge (04/29/17) due to wanting to verify the medications during his follow up doctors appointments  - Will start all his medications from hospital discharge except spironolactone.  - Decrease torsemide to 20mg  every day  - Entresto was decreased to 24/26 BID after last hospital discharge; given #56 samples today (LOT [id number], Exp: Sep 2020)  - Has appointment scheduled with cardiologist Rockey Situ) 05/12/17  2: HTN- - BP elevated most likely due to not having taken any of his medications for 2 days  - has primary care appointment at [facility] tomorrow, 05/02/17   3: Back pain- - this started ~4 weeks ago and has been chronic ever since. 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 Each medication was verbally reviewed with the patient and he was encouraged to bring the bottles to every visit to confirm accuracy of list.  Return in 1 month or sooner for  any questions/problems before then.   Candelaria Stagers, PharmD  Pharmacy Resident  05/01/17

## 2017-05-06 ENCOUNTER — Ambulatory Visit: Payer: Self-pay | Admitting: Internal Medicine

## 2017-05-10 ENCOUNTER — Emergency Department
Admission: EM | Admit: 2017-05-10 | Discharge: 2017-05-10 | Disposition: A | Discharge status: Home / Self Care | Payer: Self-pay | Attending: Emergency Medicine | Admitting: Emergency Medicine

## 2017-05-10 ENCOUNTER — Encounter: Payer: Self-pay | Admitting: Emergency Medicine

## 2017-05-10 ENCOUNTER — Emergency Department: Payer: Self-pay

## 2017-05-10 DIAGNOSIS — R109 Unspecified abdominal pain: Secondary | ICD-10-CM

## 2017-05-10 DIAGNOSIS — F17210 Nicotine dependence, cigarettes, uncomplicated: Secondary | ICD-10-CM | POA: Insufficient documentation

## 2017-05-10 DIAGNOSIS — J449 Chronic obstructive pulmonary disease, unspecified: Secondary | ICD-10-CM | POA: Insufficient documentation

## 2017-05-10 DIAGNOSIS — I5022 Chronic systolic (congestive) heart failure: Secondary | ICD-10-CM | POA: Insufficient documentation

## 2017-05-10 DIAGNOSIS — K2900 Acute gastritis without bleeding: Secondary | ICD-10-CM | POA: Insufficient documentation

## 2017-05-10 DIAGNOSIS — E119 Type 2 diabetes mellitus without complications: Secondary | ICD-10-CM | POA: Insufficient documentation

## 2017-05-10 DIAGNOSIS — N183 Chronic kidney disease, stage 3 (moderate): Secondary | ICD-10-CM | POA: Insufficient documentation

## 2017-05-10 DIAGNOSIS — I130 Hypertensive heart and chronic kidney disease with heart failure and stage 1 through stage 4 chronic kidney disease, or unspecified chronic kidney disease: Secondary | ICD-10-CM | POA: Insufficient documentation

## 2017-05-10 DIAGNOSIS — Z79899 Other long term (current) drug therapy: Secondary | ICD-10-CM | POA: Insufficient documentation

## 2017-05-10 DIAGNOSIS — Z7982 Long term (current) use of aspirin: Secondary | ICD-10-CM | POA: Insufficient documentation

## 2017-05-10 LAB — COMPREHENSIVE METABOLIC PANEL
ALT: 24 U/L (ref 17–63)
AST: 40 U/L (ref 15–41)
Albumin: 3.7 g/dL (ref 3.5–5.0)
Alkaline Phosphatase: 79 U/L (ref 38–126)
Anion gap: 10 (ref 5–15)
BUN: 13 mg/dL (ref 6–20)
CO2: 22 mmol/L (ref 22–32)
Calcium: 8.9 mg/dL (ref 8.9–10.3)
Chloride: 101 mmol/L (ref 101–111)
Creatinine, Ser: 1.08 mg/dL (ref 0.61–1.24)
GFR calc Af Amer: >60 mL/min (ref >60)
GFR calc non Af Amer: >60 mL/min (ref >60)
Glucose, Bld: 331 mg/dL — ABNORMAL HIGH (ref 65–99)
Potassium: 4.1 mmol/L (ref 3.5–5.1)
Sodium: 133 mmol/L — ABNORMAL LOW (ref 135–145)
Total Bilirubin: 1.1 mg/dL (ref 0.3–1.2)
Total Protein: 6.9 g/dL (ref 6.5–8.1)

## 2017-05-10 LAB — CBC
HCT: 41.2 % (ref 40.0–52.0)
Hemoglobin: 15 g/dL (ref 13.0–18.0)
MCH: 32 pg (ref 26.0–34.0)
MCHC: 36.4 g/dL — ABNORMAL HIGH (ref 32.0–36.0)
MCV: 87.8 fL (ref 80.0–100.0)
Platelets: 167 10*3/uL (ref 150–440)
RBC: 4.7 MIL/uL (ref 4.40–5.90)
RDW: 14.1 % (ref 11.5–14.5)
WBC: 9.2 10*3/uL (ref 3.8–10.6)

## 2017-05-10 LAB — URINALYSIS, COMPLETE (UACMP) WITH MICROSCOPIC
Bacteria, UA: NONE SEEN
Bilirubin Urine: NEGATIVE
Glucose, UA: >=500 mg/dL — AB
Hgb urine dipstick: NEGATIVE
Ketones, ur: NEGATIVE mg/dL
Leukocytes, UA: NEGATIVE
Nitrite: NEGATIVE
Protein, ur: 100 mg/dL — AB
Specific Gravity, Urine: 1.03 (ref 1.005–1.030)
Squamous Epithelial / LPF: NONE SEEN
pH: 5 (ref 5.0–8.0)

## 2017-05-10 LAB — LIPASE, BLOOD: Lipase: 58 U/L — ABNORMAL HIGH (ref 11–51)

## 2017-05-10 MED ORDER — FAMOTIDINE 40 MG PO TABS: 40.0000 mg | ORAL_TABLET | Freq: Every evening | ORAL | 1 refills | Status: DC

## 2017-05-10 MED ORDER — SUCRALFATE 1 G PO TABS: 1.0000 g | ORAL_TABLET | Freq: Four times a day (QID) | ORAL | 0 refills | Status: DC

## 2017-05-10 MED ORDER — MORPHINE SULFATE (PF) 4 MG/ML IV SOLN
4.0000 mg | Freq: Once | INTRAVENOUS | Status: AC
  Administered 2017-05-10: 4 mg via INTRAVENOUS
  Filled 2017-05-10: qty 1

## 2017-05-10 MED ORDER — KETOROLAC TROMETHAMINE 30 MG/ML IJ SOLN
30.0000 mg | Freq: Once | INTRAMUSCULAR | Status: AC
  Administered 2017-05-10: 30 mg via INTRAVENOUS
  Filled 2017-05-10: qty 1

## 2017-05-10 MED ORDER — GI COCKTAIL ~~LOC~~
30.0000 mL | Freq: Once | ORAL | Status: AC
  Administered 2017-05-10: 30 mL via ORAL
  Filled 2017-05-10: qty 30

## 2017-05-10 NOTE — Progress Notes (Signed)
Patient ID: Gabriel Mantell., male   DOB: [birth year], 46 y.o.   MRN: [id number] Cardiology Office Note  Date:  05/12/2017   ID:  Gabriel Righter., DOB 1971/01/05, MRN [id number]  PCP:  [facility]   Chief Complaint  Patient presents with  . other    Follow up from [facility]; chest pain & CHF. Pt. c/o stomach pain, shortness of breath and discomfort. Meds reviewed by the pt. verbally.     HPI:  46 y.o. male with h/o  nonischemic cardiomyopathy by nuclear stress test in 2016, History of medication noncompliance chronic systolic CHF, ejection fraction 30-35%, up to 45% - 50% COPD,  ongoing tobacco abuse at 2 ppd since age 77,  DM2,  obesity  chronically elevated troponins given his chronic renal insufficiency. [place] on 01/03/16 with cough and SOB, septic shock, right upper lobe pneumonia,  He presents today for follow-up of his nonischemic cardiomyopathy  Lab work reviewed with him in detail HBA1C 7.1  In hospital 04/2017 Hospital records reviewed with the patient in detail orthostatic lightheadedness in the setting of acute kidney injury. Creatinine 2.6 intravascular volume depletion Meds: Entresto,  torsemide to 40 mg daily,  metoprolol succinate to 25 mg daily. Spironolactone held  LVEF, now 45-50%.  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  does not have insurance, lives with his father Previously did not fill out paperwork for medical management/assistance with his medications as he does not want to give his father's information Currently unemployed Reports he is trying to [place], get disability Followed at [place] 02/17/2016, admitted for chest pain/pneumonia He was put back on several medications for cardiomyopathy and blood pressure  Reports having sleep study in the past, was positive, did not tolerate CPAP.  Does not want to wear that, feels he sleeps fine  On prior office visit, going to the pool with his family, doing various activities Does not feel that he can work, has bills to pay, has not worked Does not have money for medications  No episodes of syncope  Previous echocardiogram reviewed with him showing improving ejection fraction up to 30-35%, previously 25% in the setting of sepsis   Echocardiogram in the hospital  in the setting of sepsis showed severely depressed ejection fraction 25% CXR showed right upper lobe consolidation c/w pneumonia.  started on IV Rocephin and azithromycin.  Troponin trend of 0.22-->0.19-->0.18-->1.05-->0.13 WBC count of 28,000 on arrival Febrile to a T max of 101.2 this admission.  Hypotensive with BP down to the 70's shortly after admission,   Prior to discharge was started on hydralazine, isosorbide, carvedilol. Secondary to acute renal failure, was not started on ACE inhibitor or ARB He did have short runs of nonsustained VT seen on  telemetry   nonsustained VT on Holter, started on amiodarone, was asymptomatic  Early June 2017, had episode of syncope in his kitchen Denied any lightheadedness or dizziness, no warning, When out for a minute witnessed by his girlfriend, woke up on the floor Went to the hospital  He wore a 48-hour Holter monitor which was  reviewed with him in detail today Monitor shows short rare runs of atrial tachycardia, also short rare episodes of nonsustained VT. He does report rare fluttering in his chest like a vibration otherwise no significant symptoms  Patient was previously admitted in August 2016 and September 2016 with atypical chest pain.  Echo during August admission showed mildly reduced LV systolic function with mildly elevated troponin and BNP. He was hypertensive and unable to afford his medications at that time.   He was discharged back in August 2016 on antihypertensives, but did nto take them. During his September 2016 admission for atypical chest pain and HTN he underwent nuclear stress testing that showed a small defect of mild severity in the apex location felt to be 2/2 apical thinning. EF was estimated at 30-44%. Overall, this was an intermediate risk scan. There was no evidence of significant perfusion abnormalities and he was felt to have NICM.   PMH:   has a past medical history of Chest wall pain, chronic; Chronic systolic CHF (congestive heart failure) ([place]); Chronic Troponin Elevation; CKD (chronic kidney disease), stage III; COPD (chronic obstructive pulmonary disease) ([place]); Hypertension; NICM (nonischemic cardiomyopathy) ([place]); Non-obstructive CAD (coronary artery disease); NSVT (nonsustained ventricular tachycardia) ([place]); Obesity (BMI 30.0-34.9); Psoriasis; Syncope; and Tobacco abuse.  PSH:    Past Surgical History:  Procedure Laterality Date  . AMPUTATION    . FINGER AMPUTATION     Traumatic  . LEFT HEART CATH AND CORONARY ANGIOGRAPHY N/A 01/06/2017   Procedure: Left Heart Cath and Coronary Angiography;  Surgeon: Wellington Hampshire, MD;  Location: [place] [facility];  Service: Cardiovascular;  Laterality: N/A;    Current Outpatient Prescriptions  Medication Sig Dispense Refill  . amiodarone (PACERONE) 200 MG tablet Take 1 tablet (200 mg total) by mouth 2 (two) times daily. 90 tablet 3  .  aspirin 81 MG chewable tablet Chew 1 tablet (81 mg total) by mouth daily. Reported on 02/08/2016 30 tablet 2  . famotidine (PEPCID) 40 MG tablet Take 1 tablet (40 mg total) by mouth every evening. 30 tablet 1  . metoprolol succinate (TOPROL-XL) 25 MG 24 hr tablet Take 1 tablet (25 mg total) by mouth daily. Take with or immediately following a meal. 30 tablet 0  . rosuvastatin (CRESTOR) 20 MG tablet Take 1 tablet (20 mg total) by mouth daily. 90 tablet 3  . sacubitril-valsartan (ENTRESTO) 24-26 MG Take 1 tablet by mouth 2 (two) times daily. 180 tablet 3  . spironolactone (ALDACTONE) 25 MG tablet Take 1 tablet (25 mg total) by mouth daily. 90 tablet 3  . sucralfate (CARAFATE) 1 g tablet Take 1 tablet (1 g total) by mouth 4 (four) times daily. 60 tablet 0  . torsemide (DEMADEX) 20 MG tablet Take 20 mg by mouth daily.     No current facility-administered medications for this visit.      Allergies:   Prednisone   Social History:  The patient  reports that he has been smoking Cigarettes.  He has been smoking about 2.00 packs per day for the past 33 years. He has never used smokeless tobacco. He reports that he drinks about 1.8 oz of alcohol  per week. He reports that he does not use illicit drugs.   Family History:   family history includes Cancer in his maternal grandfather and paternal aunt; Diabetes Mellitus II in his mother; Hypertension in his father.    Review of Systems: Review of Systems  Constitutional: Positive for malaise/fatigue.  Respiratory: Negative.   Cardiovascular: Negative.   Gastrointestinal: Negative.   Musculoskeletal: Negative.   Neurological: Positive for weakness.  Psychiatric/Behavioral: Negative.   All other systems reviewed and are negative.    PHYSICAL EXAM: VS:  BP 124/88 (BP Location: Left Arm, Patient Position: Sitting, Cuff Size: Large)   Pulse 90   Ht 6' (1.829 m)   Wt 259 lb 8 oz (117.7 kg)   BMI 35.19 kg/m  , BMI Body mass index is 35.19  kg/m.  GEN: Well nourished, well developed, in no acute distress , obese HEENT: normal  Neck: no JVD, carotid bruits, or masses Cardiac: RRR; no murmurs, rubs, or gallops,no edema  Respiratory:  clear to auscultation bilaterally, normal work of breathing GI: soft, nontender, nondistended, + BS MS: no deformity or atrophy  Skin: warm and dry, no rash Neuro:  Strength and sensation are intact Psych: euthymic mood, full affect    Recent Labs: 01/06/2017: Magnesium 1.7 04/28/2017: B Natriuretic Peptide 25.0; TSH 2.132 05/10/2017: ALT 24; BUN 13; Creatinine, Ser 1.08; Hemoglobin 15.0; Platelets 167; Potassium 4.1; Sodium 133    Lipid Panel Lab Results  Component Value Date   CHOL 74 01/04/2016   HDL 14 (L) 01/04/2016   LDLCALC 46 01/04/2016   TRIG 71 01/04/2016      Wt Readings from Last 3 Encounters:  05/12/17 259 lb 8 oz (117.7 kg)  05/01/17 260 lb 8 oz (118.2 kg)  04/28/17 260 lb (117.9 kg)       ASSESSMENT AND PLAN:  Syncope, unspecified syncope type No further episodes of syncope  previous episodes concerning  for arrhythmia, sustained VT  on low-dose amiodarone  He is on metoprolol succinate  Cardiomyopathy ([place]) - Prior history of medication noncompliance, inability to afford his medications Reports that he has his medications now, ejection fraction trending upwards, improved from 25% now 45% Still very deconditioned, obese. 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 No changes made to the medications.  Chronic systolic heart failure (HCC) Appears euvolemic, stressed importance of staying on his medications Takes torsemide 40 daily  Cad previouscardiac catheterization with nonobstructive disease, unrelated to his cardiomyopathy No further workup needed    Total encounter time more than 25 minutes  Greater than 50% was spent in counseling and coordination of care  with the patient    No orders of the defined types were placed in this encounter.    Signed, Esmond Plants, M.D., Ph.D. 05/12/2017  [place], [place]

## 2017-05-10 NOTE — Discharge Instructions (Signed)
Please seek medical attention for any high fevers, chest pain, shortness of breath, change in behavior, persistent vomiting, bloody stool or any other new or concerning symptoms.  

## 2017-05-10 NOTE — ED Triage Notes (Signed)
Pt to ED via [facility] from home for c/o right sided abd pain for the past few days but the pain was worse today. Pt states he has had some diarrhea but no N/V.

## 2017-05-10 NOTE — ED Notes (Signed)
Spoke with lab about status of CMP results. Lab states that they will release what has resulted but they had to dilute sample due to the amount of fat in the sample.

## 2017-05-10 NOTE — ED Notes (Signed)
Pt returned from ultrasound

## 2017-05-10 NOTE — ED Notes (Signed)
Pt being taken for ultrasound

## 2017-05-10 NOTE — ED Notes (Signed)
Dr. Archie Balboa informed pt is requesting more pain medication

## 2017-05-10 NOTE — ED Notes (Signed)
Pt family member to nurses station asking for more pain medication for pt. Family member informed that RN has asked MD about pain medication and I am waiting for orders. Pt family member stated "well he needs to come on." Family member was informed MD is with other patients at this time and that I cannot give medication until I receive orders. Pt family member requesting to speak with MD.

## 2017-05-10 NOTE — ED Provider Notes (Signed)
[facility] Emergency Department Provider Note   ____________________________________________   I have reviewed the triage vital signs and the nursing notes.   HISTORY  Chief Complaint Abdominal Pain   History limited by: Not Limited   HPI Gabriel [name]. is a 46 y.o. male who presents to the emergency department today because of concern for abdominal pain. The patient states that this morning he had some dull abdominal pain. It progressed and got worse. It is now severe. He states it feels like someone has a knife and is scoping out his inside. It got worse after he ate. States he has a history of gallbladder sludge and was told it would need to come out. He has not had any fevers.   Past Medical History:  Diagnosis Date  . Chest wall pain, chronic   . Chronic systolic CHF (congestive heart failure) ([place])    a. 03/2015 Echo: EF 45-50%; b. 12/2015 Echo: EF 20-25%; c. 02/2016 Echo: EF 30-35%; d. 11/2016 Echo: EF 40-45%.  . Chronic Troponin Elevation   . CKD (chronic kidney disease), stage III   . COPD (chronic obstructive pulmonary disease) ([place])   . Hypertension    Resolved since weight loss  . NICM (nonischemic cardiomyopathy) ([place])    a. 03/2015 Echo: EF 45-50%; b. 04/2015 MV: small defect of mild severity in apex 2/2 apical thinning, EF 30-44%;  c. 12/2015 Echo: EF 20-25%; d. 02/2016 Echo: EF 30-35%; e. 11/2016 Echo: EF 40-45%, diff HK, mildly dil Ao root, mild MR, mod dil LA;  f. 12/2016 Cath: LM nl, LAD/Diags/LCX/OMs min irregs, RCA 40p/m/d.  . Non-obstructive CAD (coronary artery disease)    a. 04/2015 low risk MV;  b. 12/2016 Cath: minor irregs in LAD/Diag/LCX/OM, RCA 40p/m/d.  [name] (nonsustained ventricular tachycardia) ([place])    a. 12/2015 noted on tele-->amio;  b. 12/2015 Event monitor: no VT. Atach noted.  . Obesity (BMI 30.0-34.9)   . Psoriasis   . Syncope    a. 01/2016 - felt to be vasovagal.  . Tobacco abuse     Patient Active Problem List   Diagnosis Date Noted  . Noncompliance with medications 04/29/2017  . Back pain 03/06/2017  . Tobacco use 12/04/2016  . Gallbladder sludge 12/04/2016  . HCAP (healthcare-associated pneumonia) 03/15/2016  . Coronary artery disease, non-occlusive 03/15/2016  . Atypical chest pain 02/16/2016  . Orthostatic hypotension 01/23/2016  . Diarrhea 01/23/2016  . Hypokalemia 01/23/2016  . Hypomagnesemia 01/23/2016  . 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 NSVT (nonsustained ventricular tachycardia) ([place])   . 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 CAP (community acquired pneumonia)   . Arterial hypotension   . Elevated troponin   . Chronic systolic heart failure ([place])   . Sepsis ([place]) 01/03/2016  . Diabetes ([place]) 05/17/2015  . Hyperlipidemia 05/17/2015  . COPD (chronic obstructive pulmonary disease) ([place]) 05/17/2015  . HTN (hypertension) 03/31/2015    Past Surgical History:  Procedure Laterality Date  . AMPUTATION    . FINGER AMPUTATION     Traumatic  . LEFT HEART CATH AND CORONARY ANGIOGRAPHY N/A 01/06/2017   Procedure: Left Heart Cath and Coronary Angiography;  Surgeon: Wellington Hampshire, MD;  Location: [place] [facility];  Service: Cardiovascular;  Laterality: N/A;    Prior to Admission medications   Medication Sig Start Date End Date Taking? Authorizing Provider  amiodarone (PACERONE) 200 MG tablet Take 1 tablet (200 mg total) by mouth 2 (two) times daily. 02/28/17   Gollan,  Kathlene November, MD  aspirin 81 MG chewable tablet Chew 1 tablet (81 mg total) by mouth daily. Reported on 02/08/2016 09/19/16   Earleen Newport, MD  metoprolol succinate (TOPROL-XL) 25 MG 24 hr tablet Take 1 tablet (25 mg total) by mouth daily. Take with or immediately following a meal. 04/29/17   Demetrios Loll, MD  rosuvastatin (CRESTOR) 20 MG tablet Take 1 tablet (20 mg total) by mouth daily. 02/28/17 05/29/17  Minna Merritts, MD  sacubitril-valsartan (ENTRESTO) 24-26 MG Take 1 tablet by mouth 2  (two) times daily. 05/01/17   Alisa Graff, FNP  spironolactone (ALDACTONE) 25 MG tablet Take 1 tablet (25 mg total) by mouth daily. Patient not taking: Reported on 05/01/2017 02/28/17   Minna Merritts, MD  torsemide (DEMADEX) 20 MG tablet Take 20 mg by mouth daily.    [provider]    Allergies Prednisone  Family History  Problem Relation Age of Onset  . Diabetes Mellitus II Mother   . Hypertension Father   . Cancer Paternal Aunt   . Cancer Maternal Grandfather     Social History Social History  Substance Use Topics  . Smoking status: Current Some Day Smoker    Packs/day: 0.50    Years: 33.00    Types: Cigarettes  . Smokeless tobacco: Never Used     Comment: currently smoking a few cigs/day.  . Alcohol use 1.8 oz/week    3 Cans of beer per week     Comment: occassionally    Review of Systems Constitutional: No fever/chills Eyes: No visual changes. ENT: No sore throat. Cardiovascular: Denies chest pain. Respiratory: Denies shortness of breath. Gastrointestinal: Positive for abdominal pain.  Genitourinary: Negative for dysuria. Musculoskeletal: Negative for back pain. Skin: Negative for rash. Neurological: Negative for headaches, focal weakness or numbness.  ____________________________________________   PHYSICAL EXAM:  VITAL SIGNS: ED Triage Vitals  Enc Vitals Group     BP 05/10/17 1637 (!) 166/99     Pulse Rate 05/10/17 1637 90     Resp 05/10/17 1637 16     Temp 05/10/17 1637 98.7 F (37.1 C)     Temp Source 05/10/17 1637 Oral     SpO2 --      Weight --      Height --      Head Circumference --      Peak Flow --      Pain Score 05/10/17 1638 10   Constitutional: Alert and oriented. Appears uncomfortable.  Eyes: Conjunctivae are normal.  ENT   Head: Normocephalic and atraumatic.   Nose: No congestion/rhinnorhea.   Mouth/Throat: Mucous membranes are moist.   Neck: No stridor. Hematological/Lymphatic/Immunilogical: No  cervical lymphadenopathy. Cardiovascular: Normal rate, regular rhythm.  No murmurs, rubs, or gallops.  Respiratory: Normal respiratory effort without tachypnea nor retractions. Breath sounds are clear and equal bilaterally. No wheezes/rales/rhonchi. Gastrointestinal: Soft and non tender. No rebound. No guarding.  Genitourinary: Deferred Musculoskeletal: Normal range of motion in all extremities. No lower extremity edema. Neurologic:  Normal speech and language. No gross focal neurologic deficits are appreciated.  Skin:  Skin is warm, dry and intact. No rash noted. Psychiatric: Mood and affect are normal. Speech and behavior are normal. Patient exhibits appropriate insight and judgment.  ____________________________________________    LABS (pertinent positives/negatives)  Lipase 58 CMP Na 133 Glu 331 Cr 1.08 CBC WBC: 9.2 Hgb: 15.0 Plt: 167 UA no evidence of UTI   ____________________________________________   EKG  I, Nance Pear, attending physician, personally  viewed and interpreted this EKG  EKG Time: 1642 Rate: 91 Rhythm: sinus rhythm Axis: normal Intervals: qtc 468 QRS: q waves V1, V2 ST changes: no st elevation Impression: abnormal ekg   ____________________________________________    RADIOLOGY  RUQ [place] No gallstones, no evidence of cholecystitis  ____________________________________________   PROCEDURES  Procedures  ____________________________________________   INITIAL IMPRESSION / ASSESSMENT AND PLAN / ED COURSE  Pertinent labs & imaging results that were available during my care of the patient were reviewed by me and considered in my medical decision making (see chart for details).  patient presented to the emergency department today with concerns for right upper quadrant pain. Patient's blood work without leukocytosis. Differential did include cholecystitis of her ultrasound did not show any evidence of this. Patient did not have any  gallstones to suggest cholelithiasis. Patient was given a GI cocktail and did feel somewhat better. I do wonder if patient is suffering more from ulcers and gastritis. Will plan on [place] patient on antacid and sucralfate. Will give patient information about diet modification.  ____________________________________________   FINAL CLINICAL IMPRESSION(S) / ED DIAGNOSES  Final diagnoses:  Abdominal pain  Acute gastritis, presence of bleeding unspecified, unspecified gastritis type     Note: This dictation was prepared with Dragon dictation. Any transcriptional errors that result from this process are unintentional     Nance Pear, MD 05/10/17 2014

## 2017-05-12 ENCOUNTER — Ambulatory Visit (INDEPENDENT_AMBULATORY_CARE_PROVIDER_SITE_OTHER): Payer: Self-pay | Admitting: Cardiovascular Disease

## 2017-05-12 ENCOUNTER — Encounter: Payer: Self-pay | Admitting: Cardiovascular Disease

## 2017-05-12 VITALS — BP 124/88 | HR 90 | Ht 72.0 in | Wt 259.5 lb

## 2017-05-12 DIAGNOSIS — Z9114 Patient's other noncompliance with medication regimen: Secondary | ICD-10-CM

## 2017-05-12 DIAGNOSIS — I472 Ventricular tachycardia: Secondary | ICD-10-CM

## 2017-05-12 DIAGNOSIS — Z720 Tobacco use: Secondary | ICD-10-CM

## 2017-05-12 DIAGNOSIS — I2510 Atherosclerotic heart disease of native coronary artery without angina pectoris: Secondary | ICD-10-CM

## 2017-05-12 DIAGNOSIS — E119 Type 2 diabetes mellitus without complications: Secondary | ICD-10-CM

## 2017-05-12 DIAGNOSIS — J439 Emphysema, unspecified: Secondary | ICD-10-CM

## 2017-05-12 DIAGNOSIS — I5022 Chronic systolic (congestive) heart failure: Secondary | ICD-10-CM

## 2017-05-12 DIAGNOSIS — I4729 Other ventricular tachycardia: Secondary | ICD-10-CM

## 2017-05-12 DIAGNOSIS — I420 Dilated cardiomyopathy: Secondary | ICD-10-CM

## 2017-05-12 NOTE — Patient Instructions (Addendum)
Medication Instructions:   Consider taking omeprazole one a day for stomach acid  Labwork:  No new labs needed  Testing/Procedures:  No further testing at this time   Follow-Up: It was a pleasure seeing you in the office today. Please call us if you have new issues that need to be addressed before your next appt.  [phone]  Your physician wants you to follow-up in: 6 months as needed.    If you need a refill on your cardiac medications before your next appointment, please call your pharmacy.

## 2017-05-17 IMAGING — CR DG CHEST 2V
1 series · 2 of 2 positions shown · non-contrast
Comparison: May 17, 2015

CLINICAL DATA: Chest pain

EXAM:
CHEST  2 VIEW

[Series 1: w chest pa · 0.14mm/px · 2 of 2 slices shown]
[im 1/2]
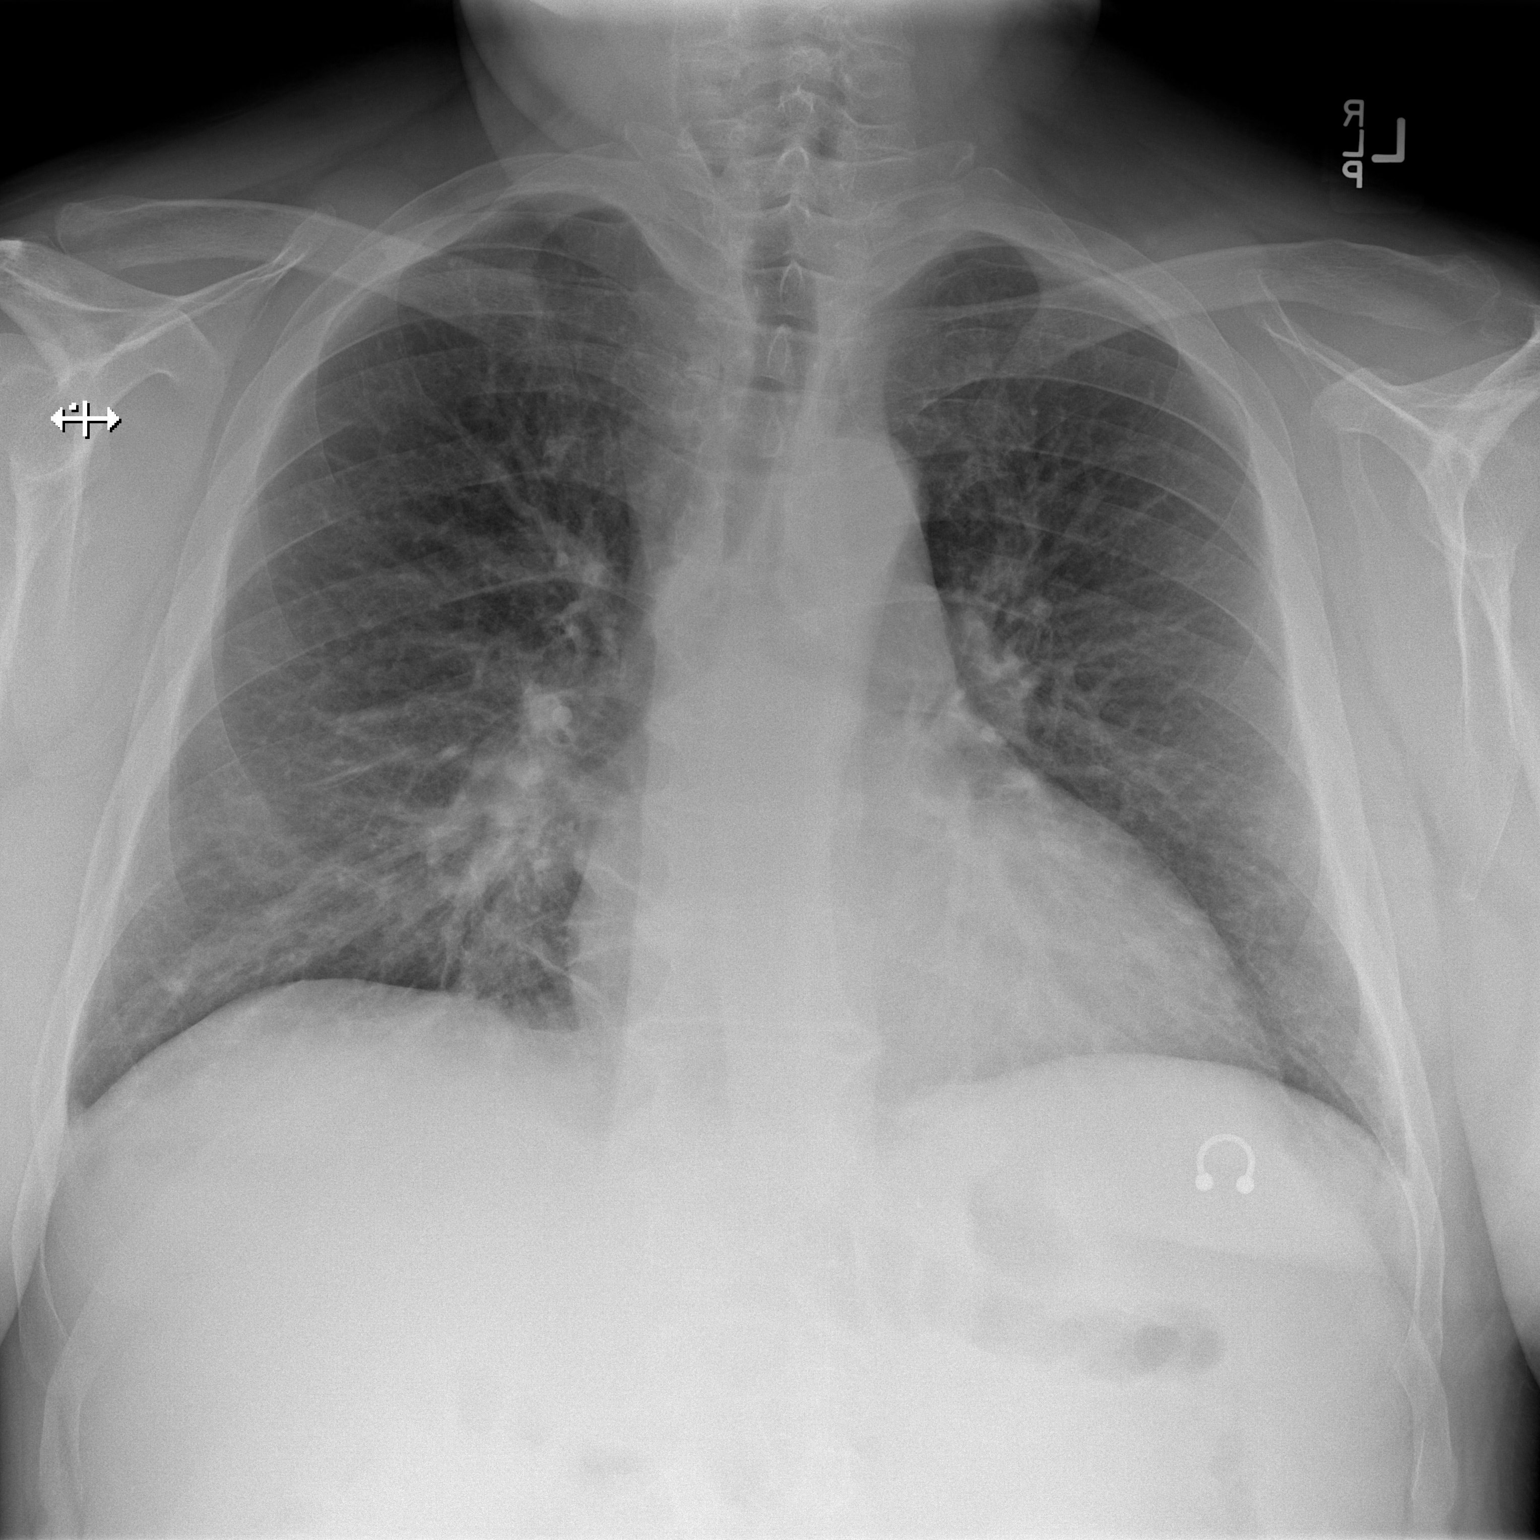
[im 2/2]
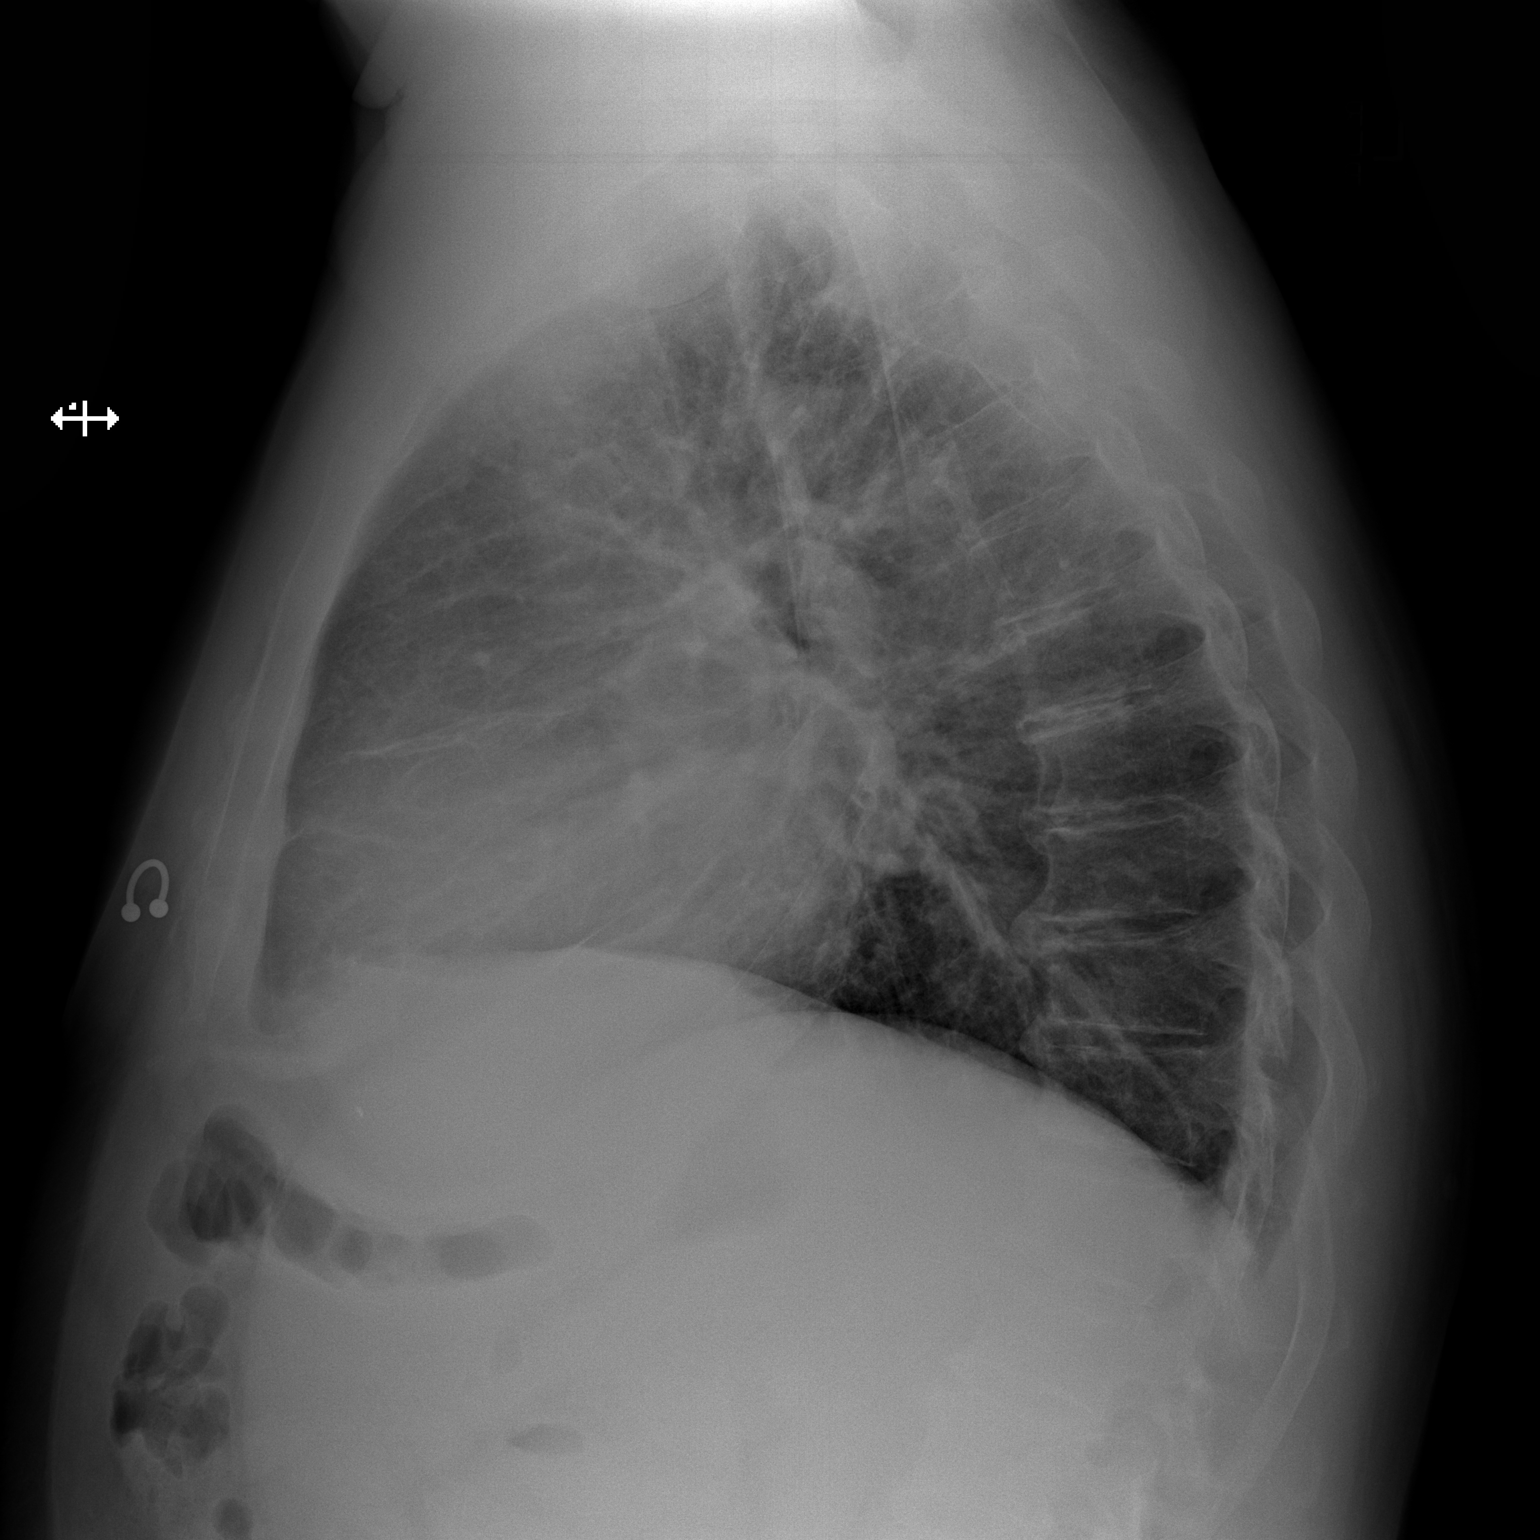

[2 of 2 positions shown; findings below may reference images not displayed]

FINDINGS: No pneumothorax. The heart size is borderline but stable. The hila
and mediastinum are unchanged. No pulmonary nodules or masses. No
overt edema.
IMPRESSION: No acute abnormalities.

## 2017-05-23 ENCOUNTER — Telehealth: Payer: Self-pay | Admitting: Pharmacist

## 2017-05-23 NOTE — Telephone Encounter (Signed)
05/23/17 Faxed page 3 of application to [employer] for Dose Change on Entresto 24/26 for processing.[name]

## 2017-06-02 ENCOUNTER — Ambulatory Visit: Payer: Self-pay | Attending: Family | Admitting: Family

## 2017-06-02 ENCOUNTER — Encounter: Payer: Self-pay | Admitting: Family

## 2017-06-02 ENCOUNTER — Telehealth: Payer: Self-pay | Admitting: Cardiovascular Disease

## 2017-06-02 VITALS — BP 172/116 | HR 83 | Resp 20 | Ht 72.0 in | Wt 261.5 lb

## 2017-06-02 DIAGNOSIS — Z7982 Long term (current) use of aspirin: Secondary | ICD-10-CM | POA: Insufficient documentation

## 2017-06-02 DIAGNOSIS — N183 Chronic kidney disease, stage 3 (moderate): Secondary | ICD-10-CM | POA: Insufficient documentation

## 2017-06-02 DIAGNOSIS — I214 Non-ST elevation (NSTEMI) myocardial infarction: Secondary | ICD-10-CM | POA: Insufficient documentation

## 2017-06-02 DIAGNOSIS — Z955 Presence of coronary angioplasty implant and graft: Secondary | ICD-10-CM | POA: Insufficient documentation

## 2017-06-02 DIAGNOSIS — Z833 Family history of diabetes mellitus: Secondary | ICD-10-CM | POA: Insufficient documentation

## 2017-06-02 DIAGNOSIS — G8929 Other chronic pain: Secondary | ICD-10-CM | POA: Insufficient documentation

## 2017-06-02 DIAGNOSIS — I10 Essential (primary) hypertension: Secondary | ICD-10-CM

## 2017-06-02 DIAGNOSIS — Z8249 Family history of ischemic heart disease and other diseases of the circulatory system: Secondary | ICD-10-CM | POA: Insufficient documentation

## 2017-06-02 DIAGNOSIS — N179 Acute kidney failure, unspecified: Secondary | ICD-10-CM | POA: Insufficient documentation

## 2017-06-02 DIAGNOSIS — Z888 Allergy status to other drugs, medicaments and biological substances status: Secondary | ICD-10-CM | POA: Insufficient documentation

## 2017-06-02 DIAGNOSIS — Z79899 Other long term (current) drug therapy: Secondary | ICD-10-CM | POA: Insufficient documentation

## 2017-06-02 DIAGNOSIS — F17210 Nicotine dependence, cigarettes, uncomplicated: Secondary | ICD-10-CM | POA: Insufficient documentation

## 2017-06-02 DIAGNOSIS — J441 Chronic obstructive pulmonary disease with (acute) exacerbation: Secondary | ICD-10-CM | POA: Insufficient documentation

## 2017-06-02 DIAGNOSIS — E119 Type 2 diabetes mellitus without complications: Secondary | ICD-10-CM

## 2017-06-02 DIAGNOSIS — I130 Hypertensive heart and chronic kidney disease with heart failure and stage 1 through stage 4 chronic kidney disease, or unspecified chronic kidney disease: Secondary | ICD-10-CM | POA: Insufficient documentation

## 2017-06-02 DIAGNOSIS — M5441 Lumbago with sciatica, right side: Secondary | ICD-10-CM | POA: Insufficient documentation

## 2017-06-02 DIAGNOSIS — E1122 Type 2 diabetes mellitus with diabetic chronic kidney disease: Secondary | ICD-10-CM | POA: Insufficient documentation

## 2017-06-02 DIAGNOSIS — I2510 Atherosclerotic heart disease of native coronary artery without angina pectoris: Secondary | ICD-10-CM | POA: Insufficient documentation

## 2017-06-02 DIAGNOSIS — Z809 Family history of malignant neoplasm, unspecified: Secondary | ICD-10-CM | POA: Insufficient documentation

## 2017-06-02 DIAGNOSIS — I5022 Chronic systolic (congestive) heart failure: Secondary | ICD-10-CM | POA: Insufficient documentation

## 2017-06-02 DIAGNOSIS — M5442 Lumbago with sciatica, left side: Secondary | ICD-10-CM | POA: Insufficient documentation

## 2017-06-02 DIAGNOSIS — Z87442 Personal history of urinary calculi: Secondary | ICD-10-CM | POA: Insufficient documentation

## 2017-06-02 DIAGNOSIS — L409 Psoriasis, unspecified: Secondary | ICD-10-CM | POA: Insufficient documentation

## 2017-06-02 DIAGNOSIS — Z720 Tobacco use: Secondary | ICD-10-CM

## 2017-06-02 DIAGNOSIS — E669 Obesity, unspecified: Secondary | ICD-10-CM | POA: Insufficient documentation

## 2017-06-02 DIAGNOSIS — Z6835 Body mass index (BMI) 35.0-35.9, adult: Secondary | ICD-10-CM | POA: Insufficient documentation

## 2017-06-02 DIAGNOSIS — Z89029 Acquired absence of unspecified finger(s): Secondary | ICD-10-CM | POA: Insufficient documentation

## 2017-06-02 LAB — GLUCOSE, CAPILLARY: Glucose-Capillary: 190 mg/dL — ABNORMAL HIGH (ref 65–99)

## 2017-06-02 MED ORDER — ASPIRIN 81 MG PO CHEW: 81.0000 mg | CHEWABLE_TABLET | Freq: Every day | ORAL | 3 refills | Status: DC

## 2017-06-02 MED ORDER — METOPROLOL SUCCINATE ER 25 MG PO TB24: 25.0000 mg | ORAL_TABLET | Freq: Every day | ORAL | 3 refills | Status: DC

## 2017-06-02 MED ORDER — FAMOTIDINE 40 MG PO TABS: 40.0000 mg | ORAL_TABLET | Freq: Every evening | ORAL | 3 refills | Status: DC

## 2017-06-02 MED ORDER — ROSUVASTATIN CALCIUM 20 MG PO TABS: 20.0000 mg | ORAL_TABLET | Freq: Every day | ORAL | 3 refills | Status: DC

## 2017-06-02 MED ORDER — SPIRONOLACTONE 25 MG PO TABS: 25.0000 mg | ORAL_TABLET | Freq: Every day | ORAL | 3 refills | Status: DC

## 2017-06-02 MED ORDER — AMIODARONE HCL 200 MG PO TABS: 200.0000 mg | ORAL_TABLET | Freq: Two times a day (BID) | ORAL | 3 refills | Status: DC

## 2017-06-02 MED ORDER — SUCRALFATE 1 G PO TABS: 1.0000 g | ORAL_TABLET | Freq: Four times a day (QID) | ORAL | 3 refills | Status: DC

## 2017-06-02 MED ORDER — TORSEMIDE 20 MG PO TABS: 20.0000 mg | ORAL_TABLET | Freq: Every day | ORAL | 3 refills | Status: DC

## 2017-06-02 MED ORDER — SACUBITRIL-VALSARTAN 24-26 MG PO TABS: 1.0000 | ORAL_TABLET | Freq: Two times a day (BID) | ORAL | 3 refills | Status: DC

## 2017-06-02 NOTE — Patient Instructions (Signed)
Continue weighing daily and call for an overnight weight gain of > 2 pounds or a weekly weight gain of >5 pounds. 

## 2017-06-02 NOTE — Telephone Encounter (Signed)
PT dropped off paperwork through [employer] for disability Paperwork placed in mail for [employer] to complete

## 2017-06-02 NOTE — Progress Notes (Signed)
Patient ID: Gabriel Dinovo., male    DOB: 1971/04/09, 46 y.o.   MRN: [id number]  HPI  Gabriel [name] is a 46 y/o male with a history of HTN, diabetes, COPD, CKD, asthma, current tobacco use and chronic heart failure.   Most recent echo done 04/29/17 showed EF 45-50%. Echo report that was done 11/28/16 and it showed an EF of 40-45% along with mild Gabriel. EF has improved from 30-35% July 2017.  Was in the ED 05/10/17 due to acute gastritis. Was treated and released. Was in [facility] ED 04/28/17 due to a near syncope episode. Noted to have acute renal failure on chronic renal insufficiency and dehydration. All BP meds were held and gentle hydration given. Was in the [facility] ED 02/05/17 due to chest pain. Evaluated and discharged home. Was in the ED 01/28/17 for chest pain. Chronically elevated troponins. Discharged home. Last admission on 01/05/17 for an NSTEMI. Had elevated troponin and underwent cardiac catheterization. Was discharged home after 2 days on nitroglycerin and Crestor. Was in the ED 12/06/16 for a kidney stone. Evaluated and discharged home. Admitted 11/28/16 with HF exacerbation. Cardiology consult obtained. IV solu-medrol given for COPD exacerbation as well. Discharged home the next day.   He presents today for a follow-up visit with a chief complaint of minimal shortness of breath upon moderate exertion. He describes this as having been present for several years with varying levels of severity. Has been out of his medications for the last 3 days as they all need refills. Has associated fatigue, cough, head congestion, chest pain, back pain, numbness down legs and difficulty sleeping. Denies any palpitations, edema, dizziness or weight gain.   Past Medical History:  Diagnosis Date  . Chest wall pain, chronic   . Chronic systolic CHF (congestive heart failure) ([place])    a. 03/2015 Echo: EF 45-50%; b. 12/2015 Echo: EF 20-25%; c. 02/2016 Echo: EF 30-35%; d. 11/2016 Echo: EF 40-45%.  . Chronic Troponin Elevation    . CKD (chronic kidney disease), stage III ([place])   . COPD (chronic obstructive pulmonary disease) ([place])   . Hypertension    Resolved since weight loss  . NICM (nonischemic cardiomyopathy) ([place])    a. 03/2015 Echo: EF 45-50%; b. 04/2015 MV: small defect of mild severity in apex 2/2 apical thinning, EF 30-44%;  c. 12/2015 Echo: EF 20-25%; d. 02/2016 Echo: EF 30-35%; e. 11/2016 Echo: EF 40-45%, diff HK, mildly dil Ao root, mild Gabriel, mod dil LA;  f. 12/2016 Cath: LM nl, LAD/Diags/LCX/OMs min irregs, RCA 40p/m/d.  . Non-obstructive CAD (coronary artery disease)    a. 04/2015 low risk MV;  b. 12/2016 Cath: minor irregs in LAD/Diag/LCX/OM, RCA 40p/m/d.  [name] (nonsustained ventricular tachycardia) ([place])    a. 12/2015 noted on tele-->amio;  b. 12/2015 Event monitor: no VT. Atach noted.  . Obesity (BMI 30.0-34.9)   . Psoriasis   . Syncope    a. 01/2016 - felt to be vasovagal.  . Tobacco abuse    Past Surgical History:  Procedure Laterality Date  . AMPUTATION    . FINGER AMPUTATION     Traumatic  . LEFT HEART CATH AND CORONARY ANGIOGRAPHY N/A 01/06/2017   Procedure: Left Heart Cath and Coronary Angiography;  Surgeon: Wellington Hampshire, MD;  Location: [facility];  Service: Cardiovascular;  Laterality: N/A;   Family History  Problem Relation Age of Onset  . Diabetes Mellitus II Mother   . Hypertension Father   . Cancer Paternal  Aunt   . Cancer Maternal Grandfather    Social History  Substance Use Topics  . Smoking status: Current Some Day Smoker    Packs/day: 0.50    Years: 33.00    Types: Cigarettes  . Smokeless tobacco: Never Used     Comment: currently smoking a few cigs/day.  . Alcohol use 1.8 oz/week    3 Cans of beer per week     Comment: occassionally   Allergies  Allergen Reactions  . Prednisone Other (See Comments)    Reaction: Hallucinations    Prior to Admission medications   Medication Sig Start Date End Date Taking? Authorizing Provider  amiodarone (PACERONE) 200 MG  tablet Take 1 tablet (200 mg total) by mouth 2 (two) times daily. 06/02/17   Alisa Graff, FNP  aspirin 81 MG chewable tablet Chew 1 tablet (81 mg total) by mouth daily. Reported on 02/08/2016 06/02/17   Alisa Graff, FNP  famotidine (PEPCID) 40 MG tablet Take 1 tablet (40 mg total) by mouth every evening. 06/02/17 06/02/18  Alisa Graff, FNP  metoprolol succinate (TOPROL-XL) 25 MG 24 hr tablet Take 1 tablet (25 mg total) by mouth daily. Take with or immediately following a meal. 06/02/17   Alisa Graff, FNP  rosuvastatin (CRESTOR) 20 MG tablet Take 1 tablet (20 mg total) by mouth daily. 06/02/17 08/31/17  Alisa Graff, FNP  sacubitril-valsartan (ENTRESTO) 24-26 MG Take 1 tablet by mouth 2 (two) times daily. 06/02/17   Alisa Graff, FNP  spironolactone (ALDACTONE) 25 MG tablet Take 1 tablet (25 mg total) by mouth daily. 06/02/17   Alisa Graff, FNP  sucralfate (CARAFATE) 1 g tablet Take 1 tablet (1 g total) by mouth 4 (four) times daily. 06/02/17   Alisa Graff, FNP  torsemide (DEMADEX) 20 MG tablet Take 1 tablet (20 mg total) by mouth daily. 06/02/17   Alisa Graff, FNP    Review of Systems  Constitutional: Positive for fatigue and fever (subjective fever). Negative for appetite change.  HENT: Positive for congestion. Negative for postnasal drip.   Eyes: Negative.   Respiratory: Positive for cough and shortness of breath. Negative for chest tightness.   Cardiovascular: Positive for chest pain. Negative for palpitations and leg swelling.  Gastrointestinal: Negative for abdominal distention and abdominal pain.  Endocrine: Negative.   Genitourinary: Negative.   Musculoskeletal: Positive for back pain. Negative for neck pain.  Skin: Negative.   Allergic/Immunologic: Negative.   Neurological: Positive for numbness (down both legs at times). Negative for dizziness and light-headedness.  Hematological: Negative for adenopathy. Does not bruise/bleed easily.   Psychiatric/Behavioral: Positive for sleep disturbance (due to pain down his legs). Negative for dysphoric mood and suicidal ideas. The patient is nervous/anxious (stress).    Vitals:   06/02/17 0911  BP: (!) 172/116  Pulse: 83  Resp: 20  SpO2: 99%  Weight: 261 lb 8 oz (118.6 kg)  Height: 6' (1.829 m)   Wt Readings from Last 3 Encounters:  06/02/17 261 lb 8 oz (118.6 kg)  05/12/17 259 lb 8 oz (117.7 kg)  05/01/17 260 lb 8 oz (118.2 kg)    Lab Results  Component Value Date   CREATININE 1.08 05/10/2017   CREATININE 1.60 (H) 04/29/2017   CREATININE 2.75 (H) 04/28/2017   Physical Exam  Constitutional: He is oriented to person, place, and time. He appears well-developed and well-nourished.  HENT:  Head: Normocephalic and atraumatic.  Neck: Normal range of motion. Neck supple. No JVD  present.  Cardiovascular: Normal rate and regular rhythm.   Pulmonary/Chest: Effort normal. He has no wheezes. He has no rales. He exhibits no tenderness.  Abdominal: Soft. He exhibits no distension. There is no tenderness.  Musculoskeletal: He exhibits tenderness. He exhibits no edema.       Lumbar back: He exhibits decreased range of motion, tenderness and pain. He exhibits no swelling.  Neurological: He is alert and oriented to person, place, and time.  Skin: Skin is warm and dry.  Psychiatric: He has a normal mood and affect. His behavior is normal. Thought content normal.  Nursing note and vitals reviewed.   Assessment & Plan:  1: Chronic heart failure with reduced ejection fraction- - NYHA class II - euvolemic - weigh daily and call for an overnight weight gain of >2 pounds or a weekly weight gain of >5 pounds.  - not adding salt and he was encouraged to closely follow a 2000mg  sodium diet - has been out of all his medications for the last 3 days. All medications [facility] refilled [facility] - 56 samples of entresto 24/26mg  were given to patient today (Lot  [id number]/ EXP 04/2019) - saw cardiologist Rockey Situ) 05/12/17 and returns in 6 months  2: HTN- - BP elevated as he's been out of all his medications for the last 3 days  - sees his PCP [facility]) either later this week or next week  3: Back pain- - this started a couple of months ago and has been chronic ever since. 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  - follow-up with PCP regarding treatment of this  4: Diabetes-   - does not check glucose as he does not have a glucometer; not on antidiabetic medications  -  A1c 7.1% on 04/28/2017 - fasting glucose in the office today was 190; reports trying to eat better - to follow with PCP [facility])  5: Tobacco use- - continues to smoke "a few" cigarettes daily - complete cessation discussed for 3 minutes with him  Patient did not bring his medications nor a list. Each medication was verbally reviewed with the patient and he was encouraged to bring the bottles to every visit to confirm accuracy of list.  Return in 3 months or sooner for any questions/problems before then.

## 2017-06-04 ENCOUNTER — Other Ambulatory Visit: Payer: Self-pay | Admitting: Family

## 2017-06-04 MED ORDER — RANITIDINE HCL 150 MG PO TABS: 150.0000 mg | ORAL_TABLET | Freq: Two times a day (BID) | ORAL | 3 refills | Status: DC

## 2017-06-06 IMAGING — CR DG CHEST 2V
2 series · 2 of 2 positions shown · non-contrast
Comparison: November 24, 2015.

CLINICAL DATA: Cough, shortness of breath.

EXAM:
CHEST  2 VIEW

[chest pa]
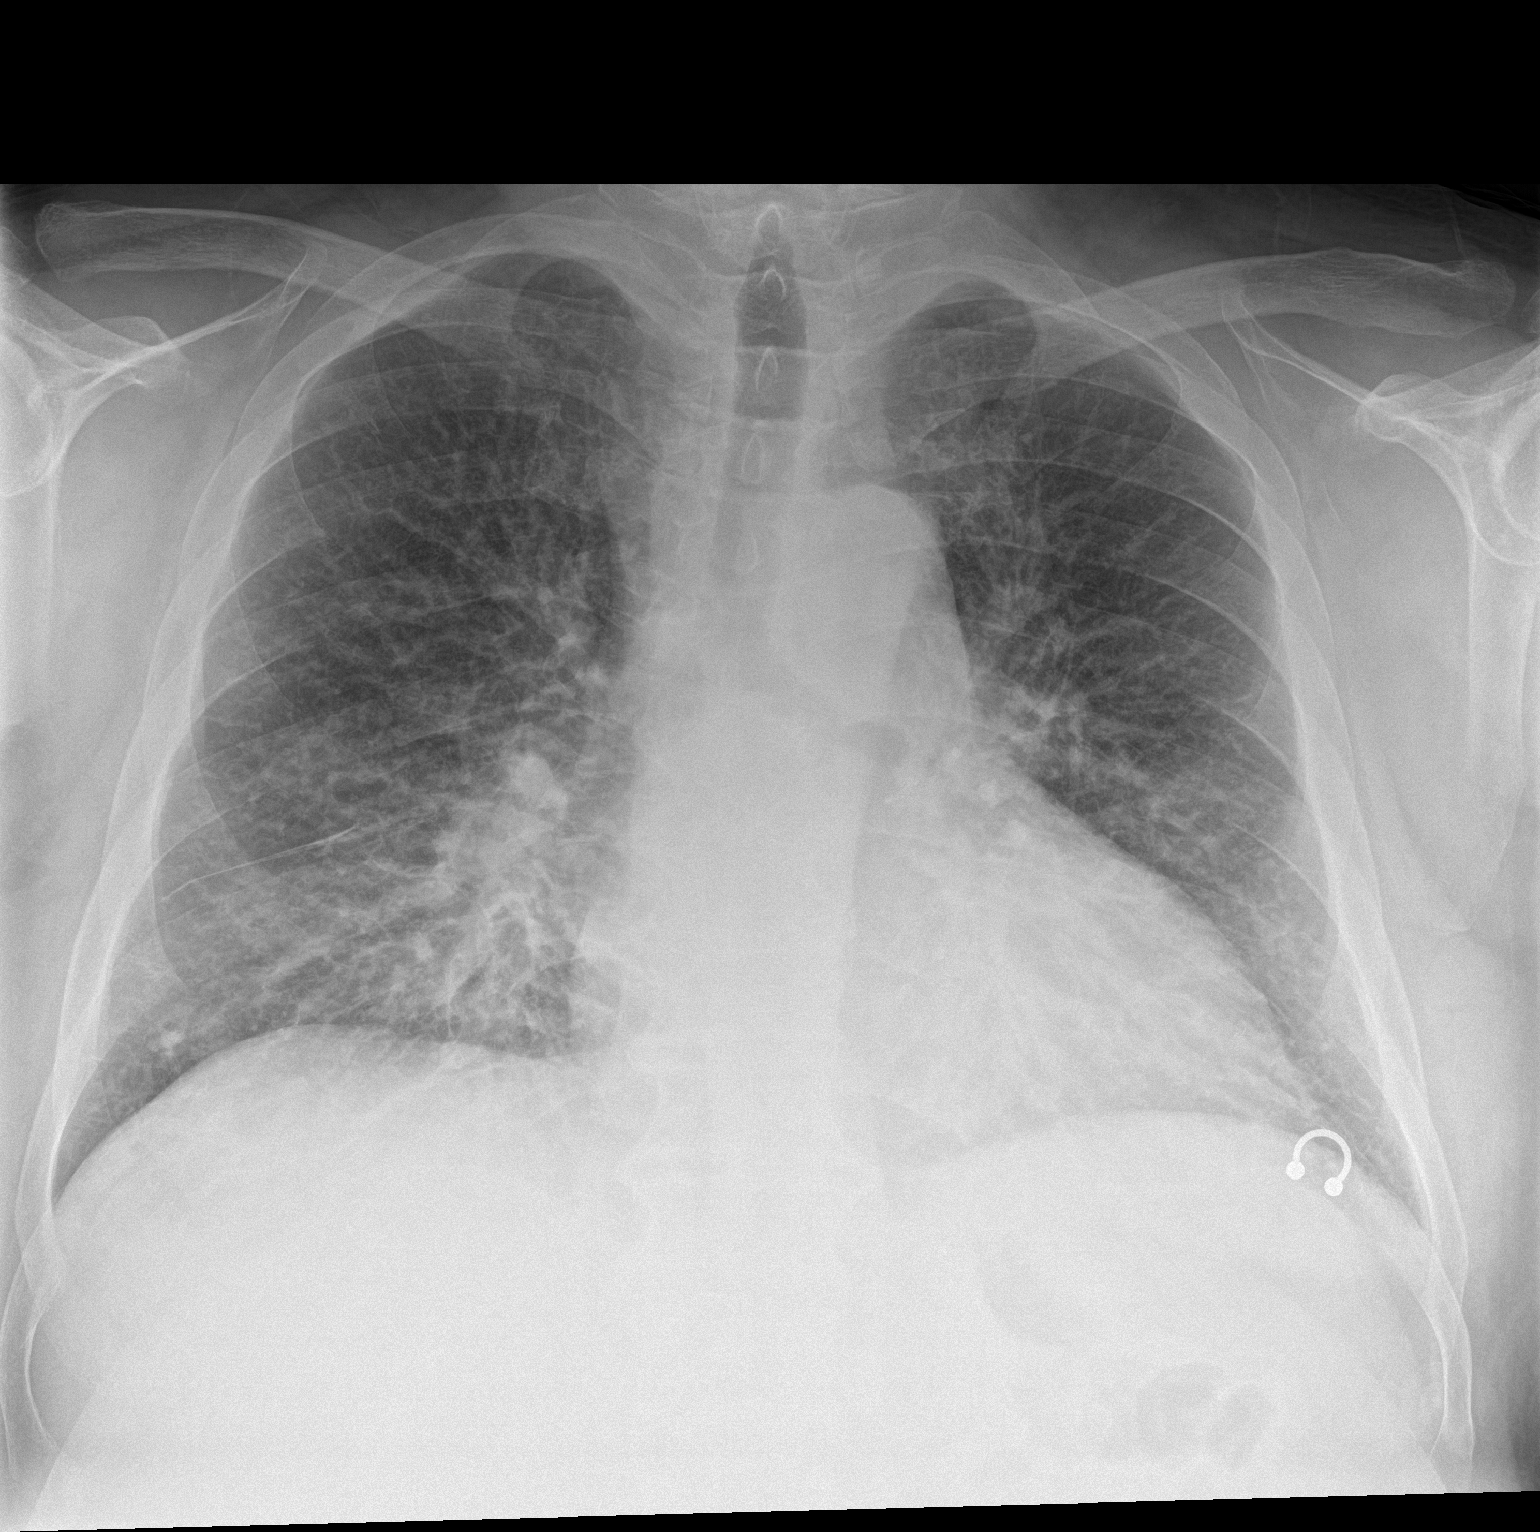

[chest lat]
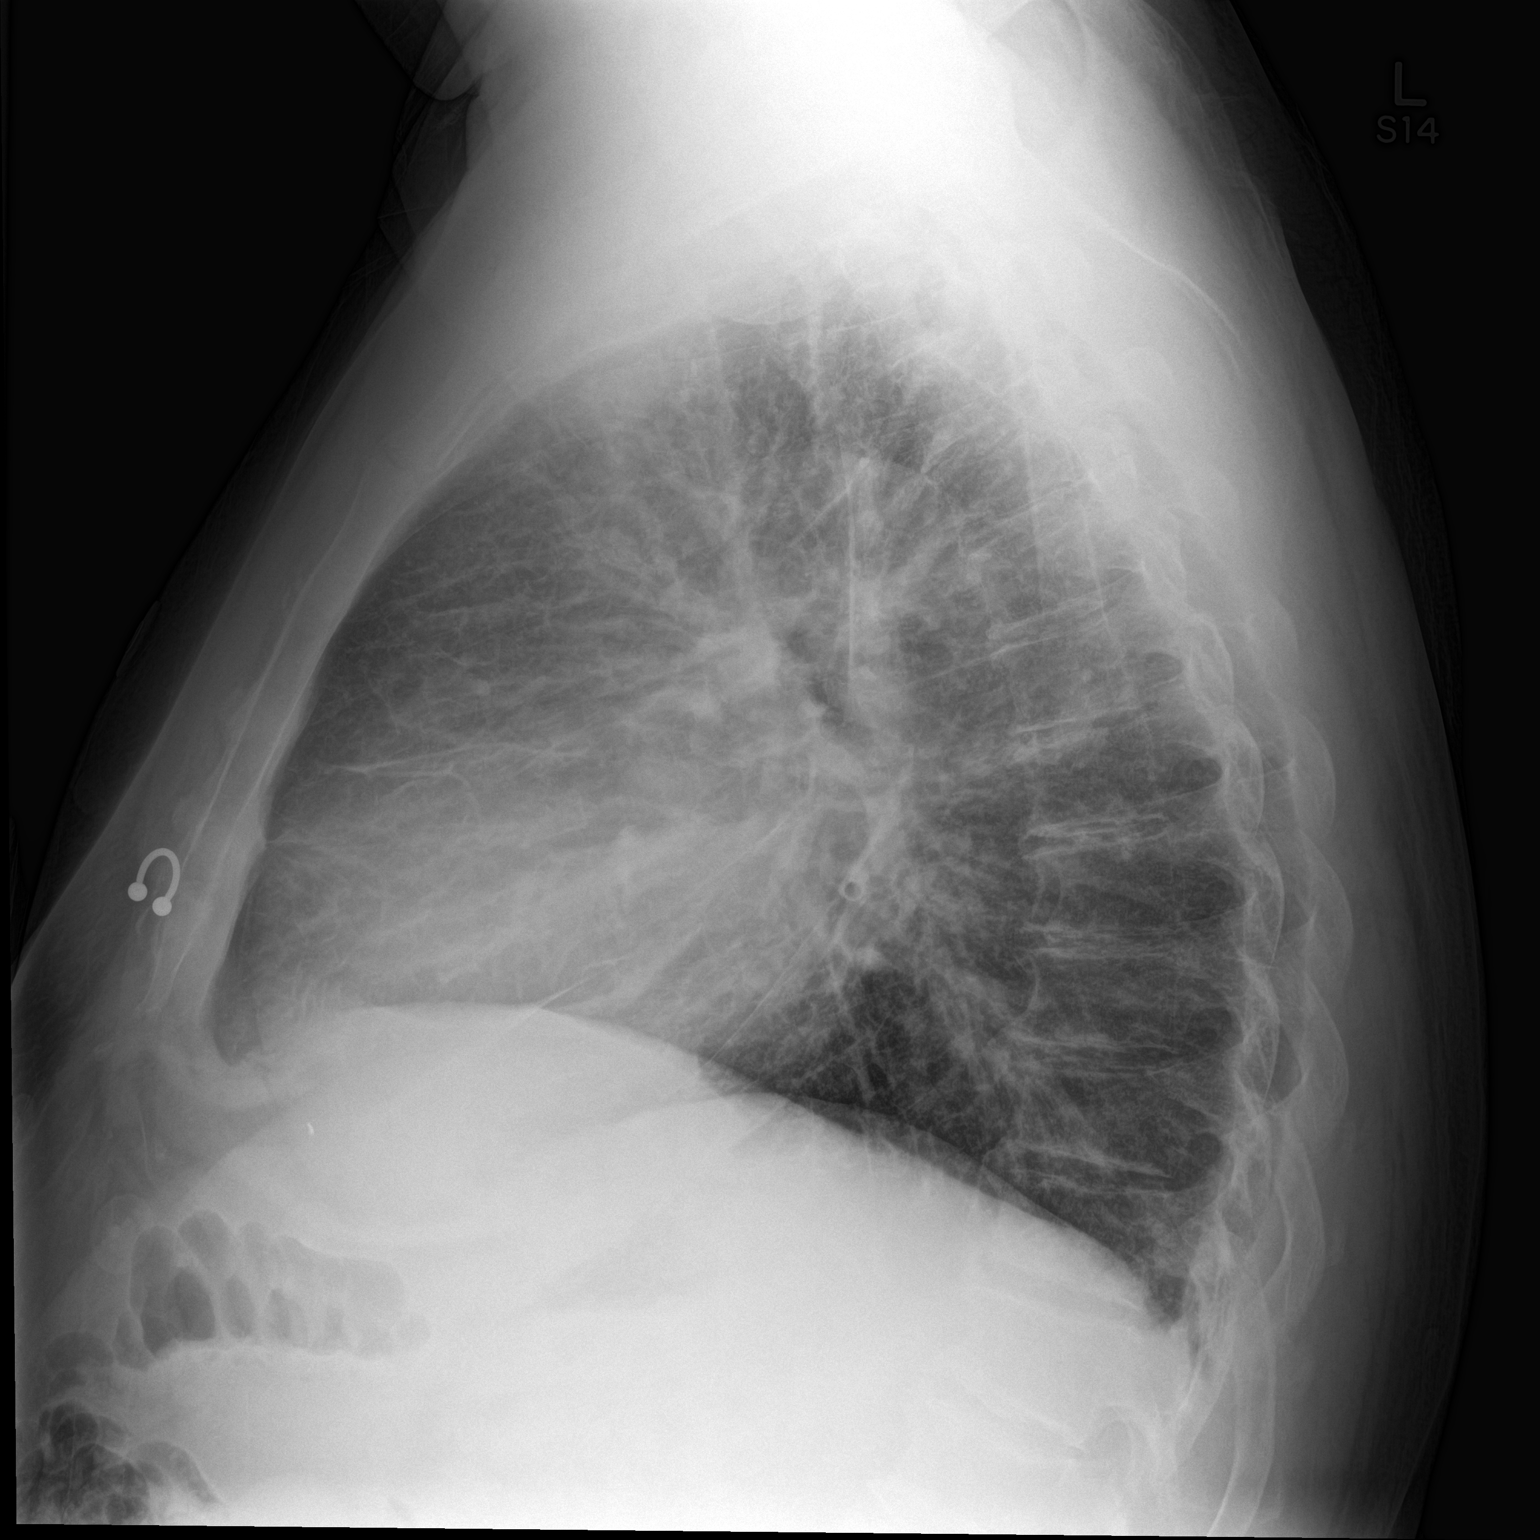

[2 of 2 positions shown; findings below may reference images not displayed]

FINDINGS: Stable cardiomediastinal silhouette. No pneumothorax or pleural
effusion is noted. Mildly increased interstitial densities are noted
in both lung bases with possible Kerley B-lines seen on the right
concerning for minimal to mild pulmonary edema. Bony thorax is
unremarkable.
IMPRESSION: Mildly increased bibasilar interstitial densities are noted
concerning for possible pulmonary edema.

## 2017-06-16 ENCOUNTER — Telehealth: Payer: Self-pay | Admitting: Cardiovascular Disease

## 2017-06-16 NOTE — Telephone Encounter (Signed)
Received records request Disability Determination SErvices, forwarded to [facility] for processing.

## 2017-06-19 ENCOUNTER — Encounter: Payer: Self-pay | Admitting: Pharmacist

## 2017-06-26 ENCOUNTER — Telehealth: Payer: Self-pay | Admitting: Cardiovascular Disease

## 2017-06-26 IMAGING — CR DG CHEST 2V
1 series · 2 of 2 positions shown · non-contrast
Comparison: None.

CLINICAL DATA: Patient states that he has had worsening cough over
past 2 weeks which has worsened today. Patient now has right side
chest pain, SOB, diarrhea, and nausea.

EXAM:
CHEST  2 VIEW

[Series 1: dg chest 2 view · 0.14mm/px · 2 of 2 slices shown]
[im 1/2]
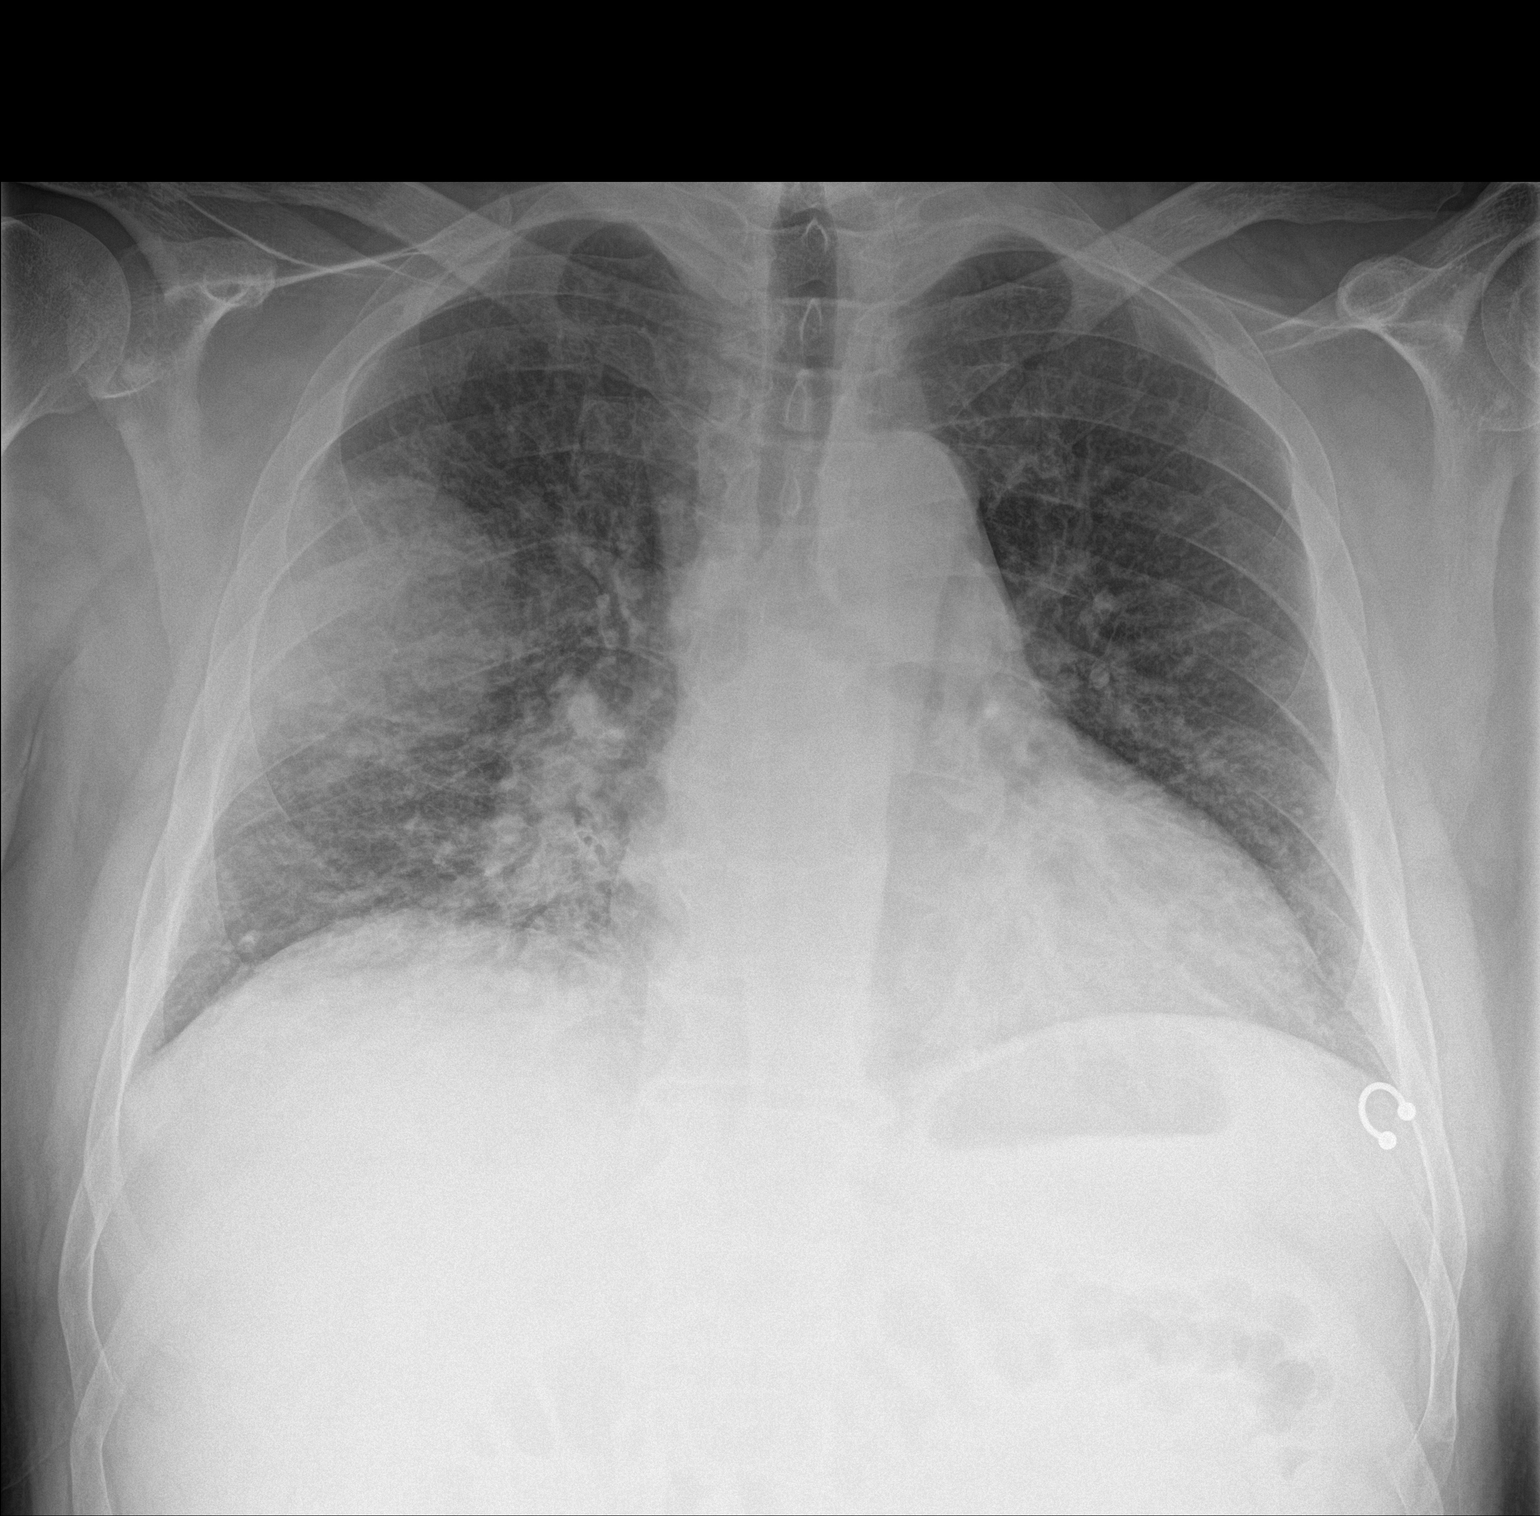
[im 2/2]
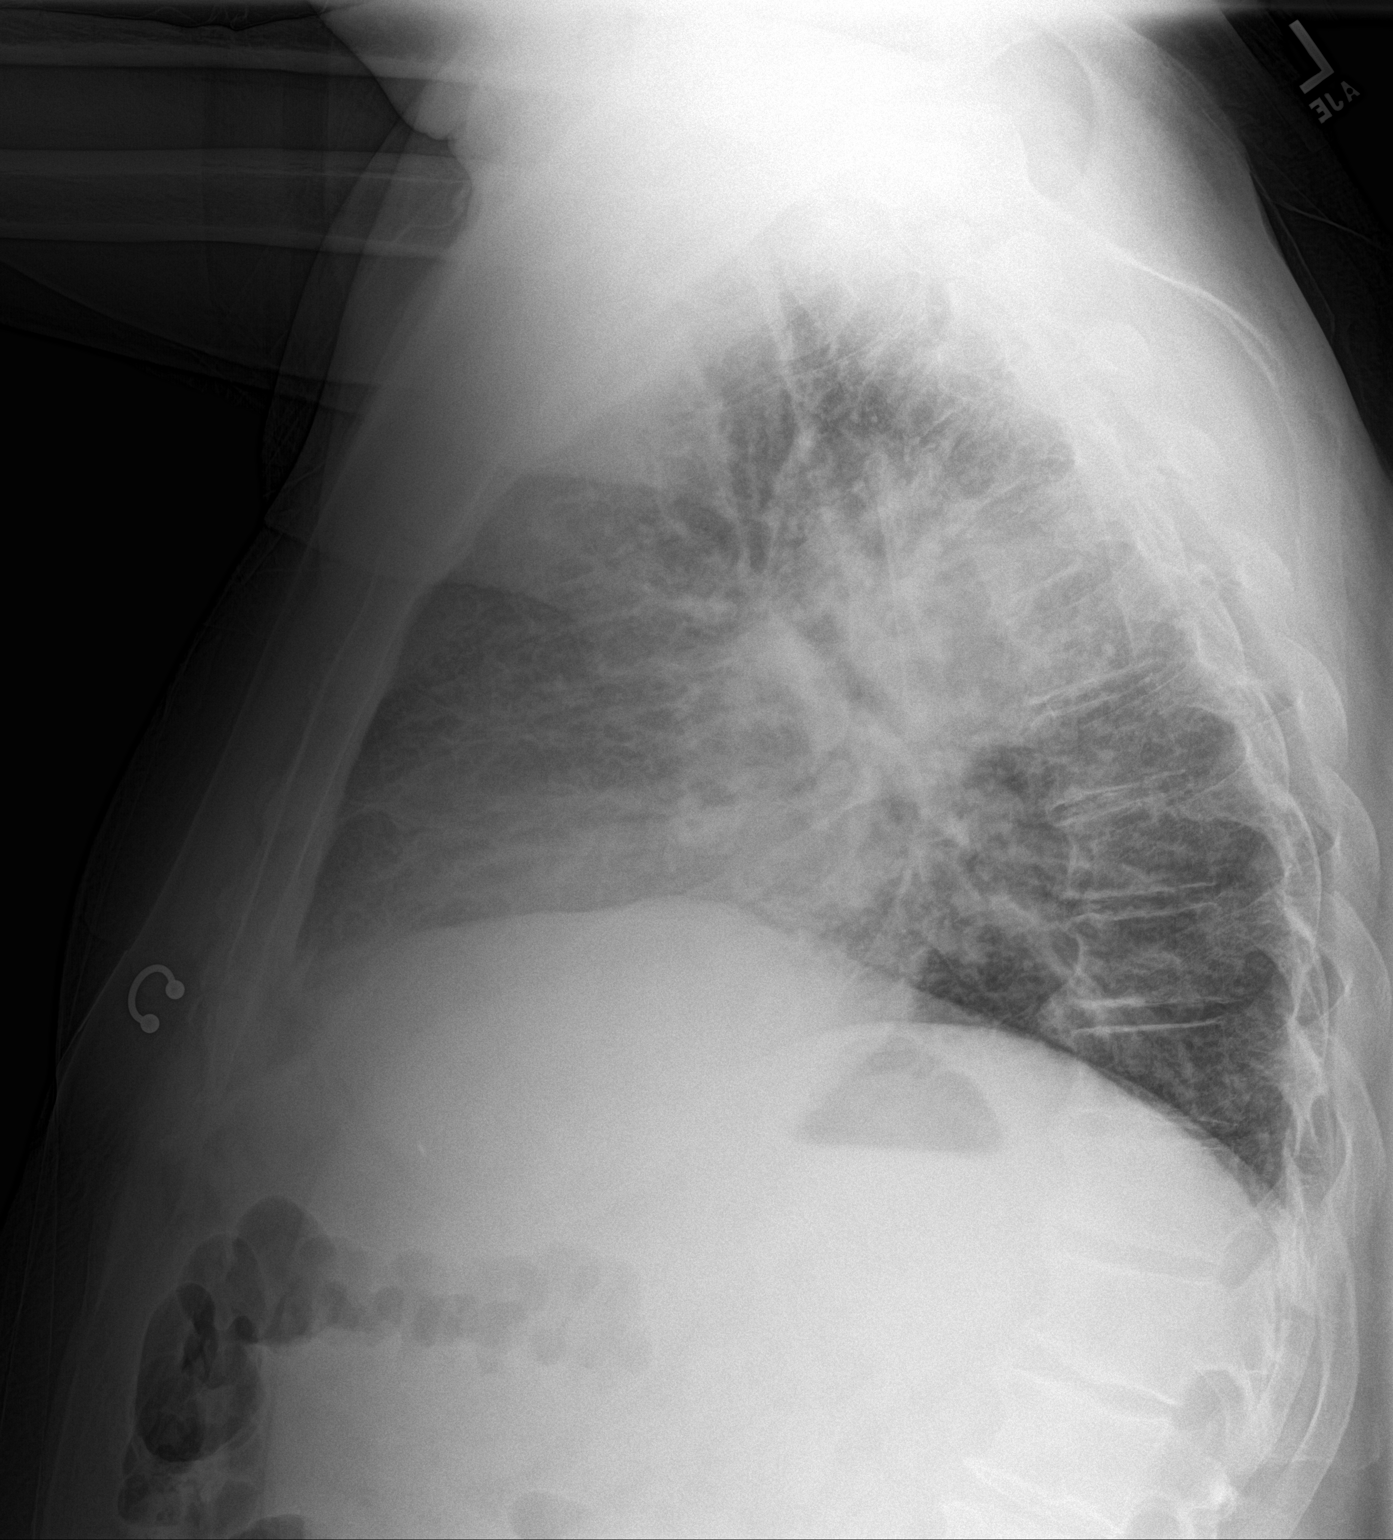

[2 of 2 positions shown; findings below may reference images not displayed]

FINDINGS: There is right upper lobe consolidation. There is bilateral chronic
interstitial lung disease. There is no pleural effusion or
pneumothorax. The heart and mediastinal contours are unremarkable.

The osseous structures are unremarkable.
IMPRESSION: Right upper lobe pneumonia. Followup PA and lateral chest X-ray is
recommended in 3-4 weeks following trial of antibiotic therapy to
ensure resolution and exclude underlying malignancy.

## 2017-06-26 NOTE — Telephone Encounter (Signed)
Would contact medical management and make sure he is picking up his medications If he is taking his medications would increase metoprolol succinate up to 50 mg daily Would also increase the Entresto up to 49/51 mg twice a day

## 2017-06-26 NOTE — Telephone Encounter (Signed)
Pt calling  Pt c/o BP issue: STAT if pt c/o blurred vision, one-sided weakness or slurred speech  1. What are your last 5 BP readings?  8 AM 165/114 11AM 184/110   2. Are you having any other symptoms (ex. Dizziness, headache, blurred vision, passed out)?  Pt states he's just not feeling well.   3. What is your BP issue? BP running a bit high today

## 2017-06-26 NOTE — Telephone Encounter (Signed)
Patient calling back in stating that his blood pressures are really high. He reports that SBP has been 160-180's over 110-130's with pulse of 58-60's. Yesterday he said his blood pressure was 175/120. Reviewed all medications and he is taking them as ordered. Instructed him to take additional dose of metoprolol succinate 25 mg and that I would route this message to [name] for further recommendations and then call him back. He was appreciative for the call, agreeable with plan, and had no further questions at this time.

## 2017-06-27 IMAGING — DX DG CHEST 1V
1 series · 1 of 1 positions shown · non-contrast
Comparison: Chest x-rays dated 01/03/2016, 12/14/2015 and
03/20/2015.

CLINICAL DATA: Central line placement.

EXAM:
CHEST 1 VIEW

[chest ap]
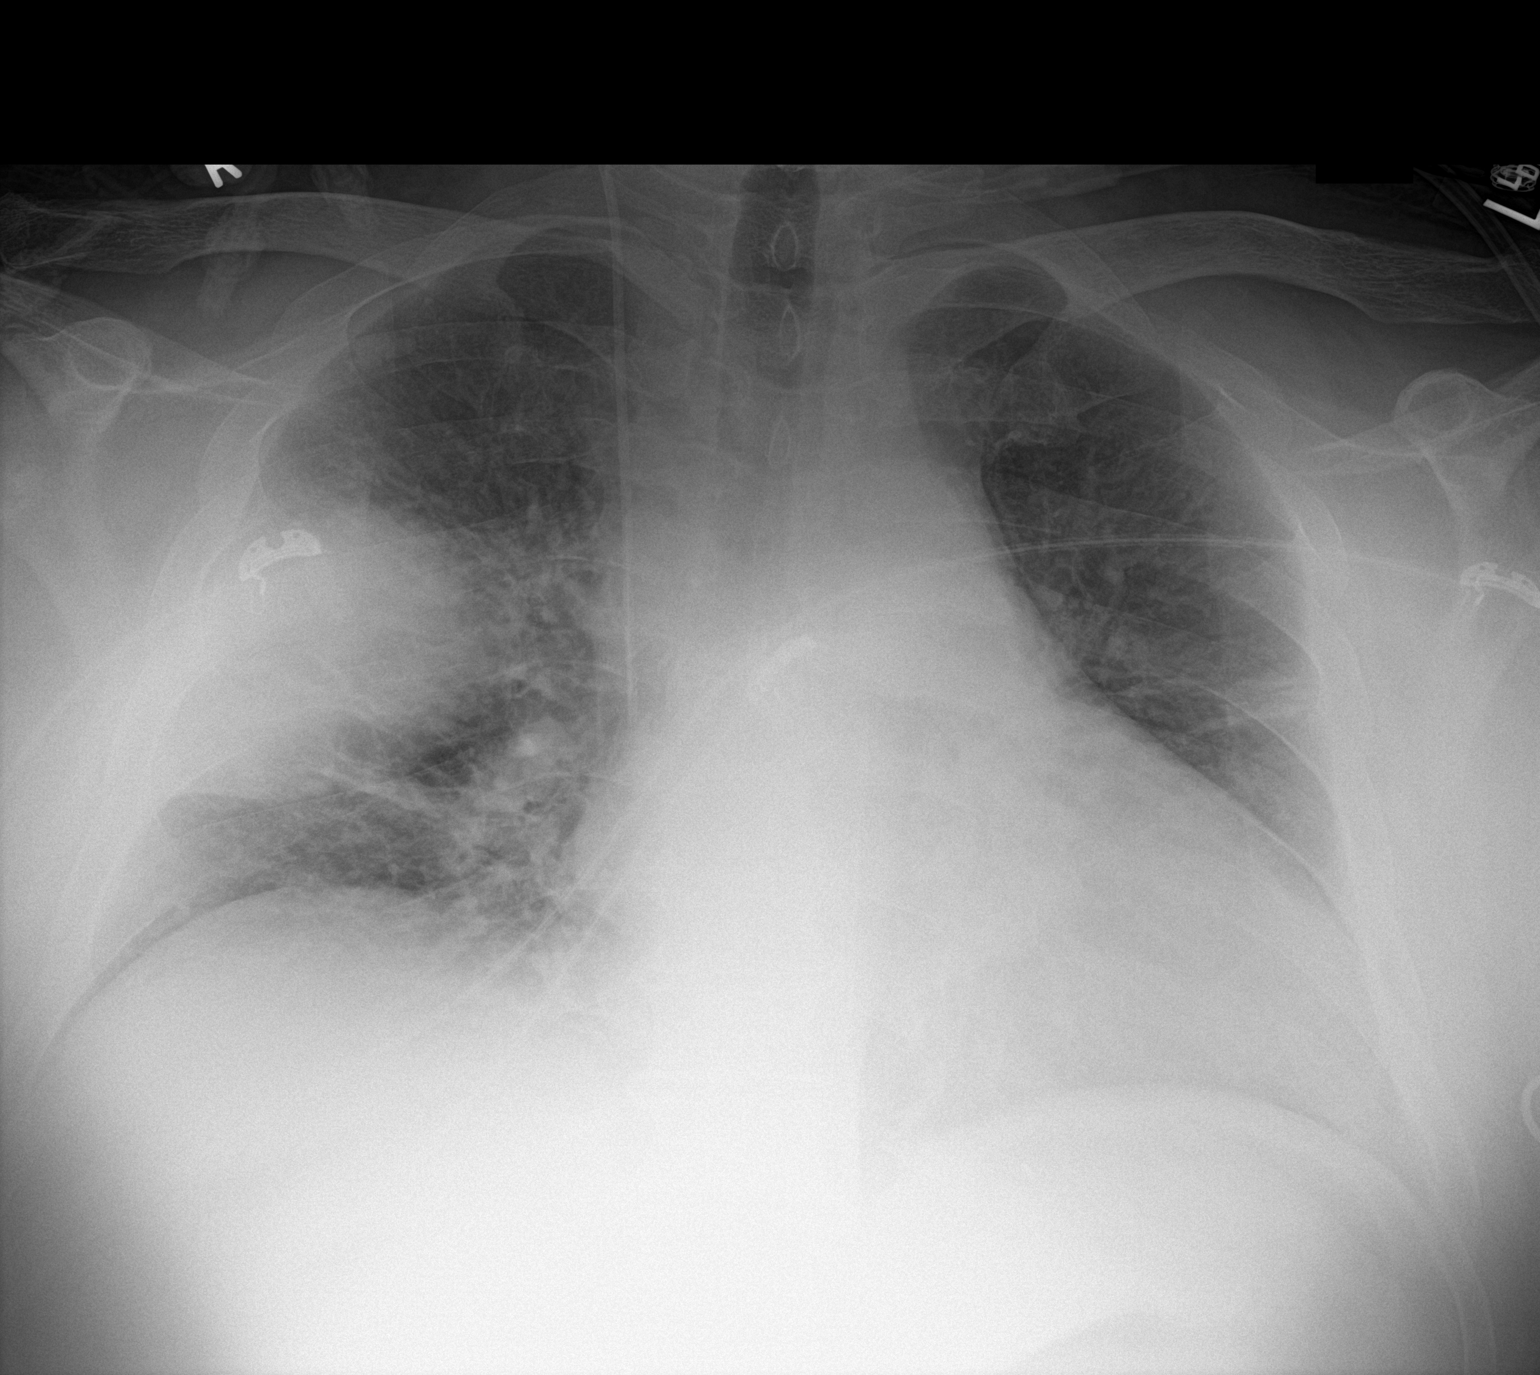

[1 of 1 positions shown; findings below may reference images not displayed]

FINDINGS: The right upper lobe consolidation is increasingly dense on today's
exam, again compatible with pneumonia. There is a stable mild
prominence of the interstitial markings within each lung.

Right IJ central line has been placed with tip well positioned at
the level of the lower SVC. No pneumothorax.
IMPRESSION: 1. Right IJ central line placement. Tip of the line appears well
positioned at the level of the lower SVC. No pneumothorax or other
complicating feature identified.
2. Right upper lobe pneumonia, increased in density compared to
yesterday's chest x-ray.

## 2017-06-27 MED ORDER — SACUBITRIL-VALSARTAN 49-51 MG PO TABS: 1.0000 | ORAL_TABLET | Freq: Two times a day (BID) | ORAL | 3 refills | Status: DC

## 2017-06-27 MED ORDER — METOPROLOL SUCCINATE ER 50 MG PO TB24: 50.0000 mg | ORAL_TABLET | Freq: Every day | ORAL | 3 refills | Status: DC

## 2017-06-27 NOTE — Telephone Encounter (Signed)
Spoke with Aldona Bar at [facility] and reviewed changes to medications. Advised that [name] increased metoprolol succinate to 50 mg once daily and also the Entresto to 49/51 mg twice daily. New prescriptions sent over to them and will notify patient that we have some samples to help until they can get the new dosage of Entresto.

## 2017-06-27 NOTE — Telephone Encounter (Signed)
Spoke with patient and made him aware that medication management will need to submit another request for the higher dose of Enstresto and that process takes 6-7 weeks. Placed samples up front for him to pick up and he verbalized understanding with no further questions at this time.   Medication Samples have been placed up front for the patient.  Drug name: [name]       Strength: 49/51 mg        Qty: 2 boxes  LOT: [id number]  Exp.Date: 5/20

## 2017-06-27 NOTE — Telephone Encounter (Signed)
Spoke with patient and he reports that he has been taking samples of the Entresto provided by [facility] clinic. Reviewed Dr. Donivan Scull recommendations and instructed him to increase metoprolol succinate to 50 mg once daily and we will also increase Entresto 49/51 mg twice a day. Let him know that I would call and make Medication management aware of these changes. He verbalized understanding of our conversation, agreement with plan, and had no further questions at this time.

## 2017-06-27 NOTE — Telephone Encounter (Signed)
Spoke with Aldona Bar at [facility] and patient has picked up metoprolol but he has not been taking entresto 24-26 mg. She states this medication was pending approval and shipment and that they should get it today. So the patient has not been taking this medication. Will make [name] aware and give patient a call to review recommendations.

## 2017-06-30 ENCOUNTER — Emergency Department: Payer: Self-pay

## 2017-06-30 ENCOUNTER — Telehealth: Payer: Self-pay

## 2017-06-30 ENCOUNTER — Telehealth: Payer: Self-pay | Admitting: Cardiovascular Disease

## 2017-06-30 ENCOUNTER — Emergency Department
Admission: EM | Admit: 2017-06-30 | Discharge: 2017-06-30 | Disposition: A | Discharge status: Home / Self Care | Payer: Self-pay | Attending: Emergency Medicine | Admitting: Emergency Medicine

## 2017-06-30 ENCOUNTER — Other Ambulatory Visit: Payer: Self-pay

## 2017-06-30 DIAGNOSIS — R739 Hyperglycemia, unspecified: Secondary | ICD-10-CM

## 2017-06-30 DIAGNOSIS — E1122 Type 2 diabetes mellitus with diabetic chronic kidney disease: Secondary | ICD-10-CM | POA: Insufficient documentation

## 2017-06-30 DIAGNOSIS — I2510 Atherosclerotic heart disease of native coronary artery without angina pectoris: Secondary | ICD-10-CM | POA: Insufficient documentation

## 2017-06-30 DIAGNOSIS — F17210 Nicotine dependence, cigarettes, uncomplicated: Secondary | ICD-10-CM | POA: Insufficient documentation

## 2017-06-30 DIAGNOSIS — Z79899 Other long term (current) drug therapy: Secondary | ICD-10-CM | POA: Insufficient documentation

## 2017-06-30 DIAGNOSIS — R519 Headache, unspecified: Secondary | ICD-10-CM

## 2017-06-30 DIAGNOSIS — I130 Hypertensive heart and chronic kidney disease with heart failure and stage 1 through stage 4 chronic kidney disease, or unspecified chronic kidney disease: Secondary | ICD-10-CM | POA: Insufficient documentation

## 2017-06-30 DIAGNOSIS — R51 Headache: Secondary | ICD-10-CM | POA: Insufficient documentation

## 2017-06-30 DIAGNOSIS — E1165 Type 2 diabetes mellitus with hyperglycemia: Secondary | ICD-10-CM | POA: Insufficient documentation

## 2017-06-30 DIAGNOSIS — H538 Other visual disturbances: Secondary | ICD-10-CM | POA: Insufficient documentation

## 2017-06-30 DIAGNOSIS — I5022 Chronic systolic (congestive) heart failure: Secondary | ICD-10-CM | POA: Insufficient documentation

## 2017-06-30 DIAGNOSIS — N183 Chronic kidney disease, stage 3 (moderate): Secondary | ICD-10-CM | POA: Insufficient documentation

## 2017-06-30 DIAGNOSIS — I10 Essential (primary) hypertension: Secondary | ICD-10-CM

## 2017-06-30 LAB — CBC
HCT: 50.5 % (ref 40.0–52.0)
Hemoglobin: 17.2 g/dL (ref 13.0–18.0)
MCH: 28.9 pg (ref 26.0–34.0)
MCHC: 34 g/dL (ref 32.0–36.0)
MCV: 84.8 fL (ref 80.0–100.0)
Platelets: 137 10*3/uL — ABNORMAL LOW (ref 150–440)
RBC: 5.95 MIL/uL — ABNORMAL HIGH (ref 4.40–5.90)
RDW: 13.6 % (ref 11.5–14.5)
WBC: 8.9 10*3/uL (ref 3.8–10.6)

## 2017-06-30 LAB — TROPONIN I
Troponin I: 0.03 ng/mL (ref <0.03)
Troponin I: 0.03 ng/mL (ref <0.03)

## 2017-06-30 LAB — BASIC METABOLIC PANEL
Anion gap: 13 (ref 5–15)
BUN: 22 mg/dL — ABNORMAL HIGH (ref 6–20)
CO2: 25 mmol/L (ref 22–32)
Calcium: 9.2 mg/dL (ref 8.9–10.3)
Chloride: 102 mmol/L (ref 101–111)
Creatinine, Ser: 1.2 mg/dL (ref 0.61–1.24)
GFR calc Af Amer: >60 mL/min (ref >60)
GFR calc non Af Amer: >60 mL/min (ref >60)
Glucose, Bld: 213 mg/dL — ABNORMAL HIGH (ref 65–99)
Potassium: 3.3 mmol/L — ABNORMAL LOW (ref 3.5–5.1)
Sodium: 140 mmol/L (ref 135–145)

## 2017-06-30 MED ORDER — ACETAMINOPHEN 500 MG PO TABS
1000.0000 mg | ORAL_TABLET | Freq: Once | ORAL | Status: AC
  Administered 2017-06-30: 1000 mg via ORAL
  Filled 2017-06-30 (×2): qty 2

## 2017-06-30 MED ORDER — AMLODIPINE BESYLATE 5 MG PO TABS
5.0000 mg | ORAL_TABLET | Freq: Once | ORAL | Status: AC
  Administered 2017-06-30: 5 mg via ORAL
  Filled 2017-06-30: qty 1

## 2017-06-30 MED ORDER — POTASSIUM CHLORIDE CRYS ER 20 MEQ PO TBCR
40.0000 meq | EXTENDED_RELEASE_TABLET | Freq: Once | ORAL | Status: AC
  Administered 2017-06-30: 40 meq via ORAL
  Filled 2017-06-30: qty 2

## 2017-06-30 MED ORDER — SODIUM CHLORIDE 0.9 % IV BOLUS (SEPSIS)
1000.0000 mL | Freq: Once | INTRAVENOUS | Status: AC
  Administered 2017-06-30: 1000 mL via INTRAVENOUS

## 2017-06-30 MED ORDER — AMLODIPINE BESYLATE 5 MG PO TABS: 5.0000 mg | ORAL_TABLET | Freq: Every day | ORAL | 0 refills | Status: DC

## 2017-06-30 MED ORDER — HYDRALAZINE HCL 20 MG/ML IJ SOLN
10.0000 mg | Freq: Once | INTRAMUSCULAR | Status: AC
  Administered 2017-06-30: 10 mg via INTRAVENOUS
  Filled 2017-06-30: qty 0.5
  Filled 2017-06-30: qty 1

## 2017-06-30 NOTE — ED Notes (Signed)
Trop. 0.03 reported via phone to Dr Burlene Arnt, no new orders.

## 2017-06-30 NOTE — ED Provider Notes (Signed)
[facility] Emergency Department Provider Note  ____________________________________________  Time seen: Approximately 2:27 PM  I have reviewed the triage vital signs and the nursing notes.   HISTORY  Chief Complaint Hypertension    HPI Gabriel [name]. is a 46 y.o. male with a history of nonischemic cardiomyopathy, CHF with an EF of 40-45%, CAD, HTN, HL, DM, presenting for hypertension, headache and blurred vision.  The patient was discharged 9/11 after hospitalization for acute renal insufficiency in the setting of overdiuresis.  Some of his diuretics and blood pressure medications were changed at this time, and he began to have progressively worsening hypertension at home.  He reports systolic blood pressures in the 967E-938B, with diastolic blood pressures in the 110s.  4 days ago, his cardiologist, [name], bumped his metoprolol dose back up to 50mg  daily.  Over the past 3 days, the patient states his blood pressures continue to be high at home.  He reports intermittent mild headaches, that are frontal.  He occasionally has bilateral blurred vision, which goes away "when I blink a lot."  He denies any changes in speech, lightheadedness or syncope, numbness tingling or weakness, difficulty walking.  He has not had any chest pain, shortness of breath, lower extremity edema, or palpitations.  The patient is a symptomatic at this time.   Past Medical History:  Diagnosis Date  . Chest wall pain, chronic   . Chronic systolic CHF (congestive heart failure) ([place])    a. 03/2015 Echo: EF 45-50%; b. 12/2015 Echo: EF 20-25%; c. 02/2016 Echo: EF 30-35%; d. 11/2016 Echo: EF 40-45%.  . Chronic Troponin Elevation   . CKD (chronic kidney disease), stage III ([place])   . COPD (chronic obstructive pulmonary disease) ([place])   . Hypertension    Resolved since weight loss  . NICM (nonischemic cardiomyopathy) ([place])    a. 03/2015 Echo: EF 45-50%; b. 04/2015 MV: small defect of mild  severity in apex 2/2 apical thinning, EF 30-44%;  c. 12/2015 Echo: EF 20-25%; d. 02/2016 Echo: EF 30-35%; e. 11/2016 Echo: EF 40-45%, diff HK, mildly dil Ao root, mild MR, mod dil LA;  f. 12/2016 Cath: LM nl, LAD/Diags/LCX/OMs min irregs, RCA 40p/m/d.  . Non-obstructive CAD (coronary artery disease)    a. 04/2015 low risk MV;  b. 12/2016 Cath: minor irregs in LAD/Diag/LCX/OM, RCA 40p/m/d.  [name] (nonsustained ventricular tachycardia) ([place])    a. 12/2015 noted on tele-->amio;  b. 12/2015 Event monitor: no VT. Atach noted.  . Obesity (BMI 30.0-34.9)   . Psoriasis   . Syncope    a. 01/2016 - felt to be vasovagal.  . Tobacco abuse     Patient Active Problem List   Diagnosis Date Noted  . Noncompliance with medications 04/29/2017  . Back pain 03/06/2017  . Tobacco use 12/04/2016  . Gallbladder sludge 12/04/2016  . HCAP (healthcare-associated pneumonia) 03/15/2016  . Coronary artery disease, non-occlusive 03/15/2016  . Atypical chest pain 02/16/2016  . Orthostatic hypotension 01/23/2016  . Diarrhea 01/23/2016  . Hypokalemia 01/23/2016  . Hypomagnesemia 01/23/2016  . Congestive dilated cardiomyopathy ([place]) 01/17/2016  . NSVT (nonsustained ventricular tachycardia) ([place])   . 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 CAP (community acquired pneumonia)   . Arterial hypotension   . Elevated troponin   . Chronic systolic heart failure ([place])   . Sepsis ([place]) 01/03/2016  . Diabetes ([place]) 05/17/2015  . Hyperlipidemia 05/17/2015  . COPD (chronic obstructive pulmonary disease) ([place]) 05/17/2015  . HTN (hypertension) 03/31/2015  Past Surgical History:  Procedure Laterality Date  . AMPUTATION    . FINGER AMPUTATION     Traumatic    Current Outpatient Rx  . Order #: [id number] Class: Normal  . Order #: [id number] Class: Print  . Order #: [id number] Class: Normal  . Order #: [id number] Class: Normal  . Order #: [id number] Class: Normal  . Order #: [id number] Class: Normal  . Order #: [id number] Class:  Normal  . Order #: [id number] Class: Normal  . Order #: [id number] Class: Normal  . Order #: [id number] Class: Normal    Allergies Prednisone  Family History  Problem Relation Age of Onset  . Diabetes Mellitus II Mother   . Hypertension Father   . Cancer Paternal Aunt   . Cancer Maternal Grandfather     Social History Social History   Tobacco Use  . Smoking status: Current Some Day Smoker    Packs/day: 0.50    Years: 33.00    Pack years: 16.50    Types: Cigarettes  . Smokeless tobacco: Never Used  . Tobacco comment: currently smoking a few cigs/day.  Substance Use Topics  . Alcohol use: Yes    Alcohol/week: 1.8 oz    Types: 3 Cans of beer per week    Comment: occassionally  . Drug use: No    Review of Systems Constitutional: No fever/chills.  No lightheadedness or syncope.  No diaphoresis. Eyes: Intermittent blurred vision. ENT: No sore throat. No congestion or rhinorrhea. Cardiovascular: Denies chest pain. Denies palpitations.  Positive hypertension. Respiratory: Denies shortness of breath.  No cough. Gastrointestinal: No abdominal pain.  No nausea, no vomiting.  No diarrhea.  No constipation. Genitourinary: Negative for dysuria. Musculoskeletal: Negative for back pain.  No lower extremity swelling or calf pain. Skin: Negative for rash. Neurological: Negative for headaches. No focal numbness, tingling or weakness.     ____________________________________________   PHYSICAL EXAM:  VITAL SIGNS: ED Triage Vitals  Enc Vitals Group     BP 06/30/17 1004 (!) 164/93     Pulse Rate 06/30/17 1004 71     Resp 06/30/17 1004 18     Temp 06/30/17 1004 99.1 F (37.3 C)     Temp Source 06/30/17 1004 Oral     SpO2 06/30/17 1004 97 %     Weight 06/30/17 1004 260 lb (117.9 kg)     Height 06/30/17 1004 6' (1.829 m)     Head Circumference --      Peak Flow --      Pain Score 06/30/17 1003 10     Pain Loc --      Pain Edu? --      Excl. in [place]? --     Constitutional:  Alert and oriented. Well appearing and in no acute distress. Answers questions appropriately. Eyes: Conjunctivae are normal.  EOMI. PERRLA.  No scleral icterus. Head: Atraumatic. Nose: No congestion/rhinnorhea. Mouth/Throat: Mucous membranes are moist.  Neck: No stridor.  Supple.  No JVD.  No meningismus. Cardiovascular: Normal rate, regular rhythm. No murmurs, rubs or gallops.  Respiratory: Normal respiratory effort.  No accessory muscle use or retractions. Lungs CTAB.  No wheezes, rales or ronchi. Gastrointestinal: Obese.  Soft, nontender and nondistended.  No guarding or rebound.  No peritoneal signs. Musculoskeletal: No LE edema. No ttp in the calves or palpable cords.  Negative Homan's sign. Neurologic: Alert and oriented 3. Speech is clear.  Face and smile symmetric. Tongue is midline.  No pronator drift. 5 out of 5 grip, biceps, triceps, hip flexors, plantar flexion and dorsiflexion. Normal  sensation to light touch in the bilateral upper and lower extremities, and face. Normal gait without ataxia. Skin:  Skin is warm, dry and intact. No rash noted. Psychiatric: Mood and affect are normal. Speech and behavior are normal.  Normal judgement.  ____________________________________________   LABS (all labs ordered are listed, but only abnormal results are displayed)  Labs Reviewed  BASIC METABOLIC PANEL - Abnormal; Notable for the following components:      Result Value   Potassium 3.3 (*)    Glucose, Bld 213 (*)    BUN 22 (*)    All other components within normal limits  CBC - Abnormal; Notable for the following components:   RBC 5.95 (*)    Platelets 137 (*)    All other components within normal limits  TROPONIN I - Abnormal; Notable for the following components:   Troponin I 0.03 (*)    All other components within normal limits  TROPONIN I   ____________________________________________  EKG  ED ECG REPORT I, Eula Listen, the attending physician, personally  viewed and interpreted this ECG.   Date: 06/30/2017  EKG Time: 1008  Rate: 68  Rhythm: normal sinus rhythm  Axis: normal  Intervals:none  ST&T Change: No STEMI  ____________________________________________  RADIOLOGY  Dg Chest 2 View  Result Date: 06/30/2017 CLINICAL DATA:  Hypertension.  Shortness of Breath EXAM: CHEST  2 VIEW COMPARISON:  April 28, 2017. FINDINGS: There is no edema or consolidation. Heart is upper normal in size with pulmonary vascularity within normal limits. No adenopathy. There is degenerative change in thoracic spine. IMPRESSION: No edema or consolidation. Electronically Signed   By: Lowella Grip III M.D.   On: 06/30/2017 10:33    ____________________________________________   PROCEDURES  Procedure(s) performed: None  Procedures  Critical Care performed: No ____________________________________________   INITIAL IMPRESSION / ASSESSMENT AND PLAN / ED COURSE  Pertinent labs & imaging results that were available during my care of the patient were reviewed by me and considered in my medical decision making (see chart for details).  46 y.o. nonischemic cardiomyopathy, CAD, HTN, HL, DM presenting with hypertension, intermittent headaches and blurred vision.  Overall, the patient is hypertensive here, but there is no evidence of any acute neurologic deficit on my examination.  His EKG is reassuring without any ischemic changes.  His laboratory studies show a normal creatinine, which is reassuring but his blood counts appear high, which may be from intravascular dehydration.  The patient has a troponin of 0.03, without any chest pain, and baseline troponin which is generally above 0.04.  We will get a second troponin but in the setting of no active chest pain, shortness of breath or other red flag symptoms, it is unlikely that this number represents ACS or MI.  I have spoken with Dr. Velva Harman, the physician on-call for Dr. Theda Sers office.  I reviewed the  patient's chief complaint, physical examination and vital signs, and current medications, and his recommendation is to initiate the patient on 5 mg daily of amlodipine and have him follow-up with outpatient.  I think this is a reasonable plan, and I have discussed follow-up instructions as well as return precautions.  I have also let the patient know that he is hyperglycemic today, needs to continue his diabetes medications; he states he is not "very good" about sticking to a diabetic diet, so I have printed him some information about recommended foods.  Follow-up with his primary care physician for this.  ----------------------------------------- 3:07 PM on 06/30/2017 -----------------------------------------  The patient CT scan does not show any acute intracranial process.  His repeat troponin is stable at 0.03, and he continues to be chest pain-free.  At this time, the patient is stable for discharge and will follow up with his primary care physician and cardiologist.  ____________________________________________  FINAL CLINICAL IMPRESSION(S) / ED DIAGNOSES  Final diagnoses:  Essential hypertension  Acute nonintractable headache, unspecified headache type  Blurred vision, bilateral  Hyperglycemia         NEW MEDICATIONS STARTED DURING THIS VISIT:  This SmartLink is deprecated. Use AVSMEDLIST instead to display the medication list for a patient.    Eula Listen, MD 06/30/17 [id number]

## 2017-06-30 NOTE — ED Notes (Signed)
Says pain in head, frontal area for about 2 weeks.  His bp has been going up despite his meds.  Says head worse x 2 days.  Blurred vision bilaterally.

## 2017-06-30 NOTE — Telephone Encounter (Signed)
Gabriel [name] contacted the office this morning to report that his BP is still very elevated 190/100. He has increased his metoprolol to 50 mg daily and Entresto to 49/51 mg per [name] on Friday and states that that is still not helping. HE is also now complaining of headache and blurred vision.   Per [name] FNP patient is advised to follow up at the ER today.

## 2017-06-30 NOTE — ED Notes (Signed)
Pt ambulated to desk to inquire about wait time. Pt ambulates with ease. Apologized for length of wait.

## 2017-06-30 NOTE — Discharge Instructions (Addendum)
Please continue all your other medications as prescribed, and ADD AMLODIPINE 5mg  by mouth once daily.  Continue to monitor your blood pressures, and bring the record with you to your cardiology follow-up appointment at the end of the week.  Today, your blood sugar was elevated; please continue your diabetes medications, and eat a diabetic diet.  Please follow-up with your primary care physician for reevaluation of this.  Return to the emergency department if you develop severe headache, blurry vision, changes in speech or mental status, numbness tingling or weakness, chest pain, shortness of breath, lightheadedness or fainting, or any other symptoms concerning to you.

## 2017-06-30 NOTE — Telephone Encounter (Signed)
Spoke with patient  He decline 11/13 appt at 920 am with [facility]  Because his dad has an appt same day   Patient c/o cont high bp 194/130 after medication change  C/o continued headache and blurred vision   Please talk to [name] per patient and call back to discuss

## 2017-06-30 NOTE — ED Triage Notes (Signed)
Pt reports he has been checking BP for last week daily. Reports that readings are high. Pt takes BP medications. Pt reports headache, blurry vision, SOB and chest tightness.

## 2017-06-30 NOTE — Telephone Encounter (Signed)
Spoke with patient and when he got home from the ED BP was 194/130. He also reports that it was 184/110 this AM when he got up from sleeping. He is very concerned with these high blood pressures and went to ED and was sent home. He wanted to know why they would do that if his blood pressures were still elevated. Instructed him to take additional dose of metoprolol 50 mg now to see if that will help for tonight. Let him know that I would route message to [name] for his review as well and would be in touch with any recommendations. He is also scheduled to come in on Monday. He was appreciative for the call back and had no further questions at this time.

## 2017-07-01 NOTE — Telephone Encounter (Signed)
Can we call medical management and confirm he is picking up his medications

## 2017-07-02 ENCOUNTER — Ambulatory Visit: Payer: Self-pay | Admitting: Family

## 2017-07-02 NOTE — Telephone Encounter (Signed)
Would recommend we see how his blood pressure goes after he starts entresto If it continues to run high would recommend he call our office

## 2017-07-02 NOTE — Telephone Encounter (Signed)
Yes he has been picking them up. We have been giving him samples until they get in his Entresto.   Spoke with Aldona Bar at [facility]ication management clinic and patient has picked up metoprolol but he has not been taking entresto 24-26 mg. She states this medication was pending approval and shipment and that they should get it today. So the patient has not been taking this medication. Will make [name] aware and give patient a call to review recommendations.           Electronically signed by Valora Corporal, RN at 06/27/2017 9:33 AM

## 2017-07-03 NOTE — Telephone Encounter (Signed)
Patient has been taking Entresto 24/26 mg samples from [facility] NP and then we also provided him with Entresto 49/51 mg as well. He is scheduled to come in and see [name] on 07/07/17.

## 2017-07-04 ENCOUNTER — Telehealth: Payer: Self-pay | Admitting: *Deleted

## 2017-07-04 NOTE — Telephone Encounter (Signed)
Dr Rockey Situ signed prescriptions needed for patient's medication management process. Called [facility] and she will pick them up next week.

## 2017-07-05 NOTE — Progress Notes (Signed)
Patient ID: Gabriel Godlewski., male   DOB: Nov 18, 1970, 46 y.o.   MRN: [id number] Cardiology Office Note  Date:  07/07/2017   ID:  Gabriel Righter., DOB September 24, 1970, MRN [id number]  PCP:  [facility]   Chief Complaint  Patient presents with  . other    ED follow up for high bloosd pressure. Patient states his blood pressure is still running high. Meds reviewed verbally with patient.     HPI:  46 y.o. male with h/o  nonischemic cardiomyopathy by nuclear stress test in 2016, History of medication noncompliance chronic systolic CHF, ejection fraction 30-35%, up to 45% - 50% COPD,  ongoing tobacco abuse at 2 ppd since age 32,  DM2,  obesity  chronically elevated troponins given his chronic renal insufficiency. [place] on 01/03/16 with cough and SOB, septic shock, right upper lobe pneumonia,  He presents today for follow-up of his nonischemic cardiomyopathy  Recent phone calls for hypertension Seen in the emergency room June 30, 2017 for hypertension Hospital records reviewed with the patient in detail  Presents to the office today, taking all of his medications Systolic pressure 833 bilaterally Reports that he feels  His denies shortness of breath, chest pain  Main problem is belching "smells like bowel" Still thinks it is his gallbladder Medical quadrant ultrasound from September 2018 reviewed with him in detail, no mention of gallstones He was told he might have "sludge" Sometimes feels like a knot sticking out his right side Continues to have chronic pain issues Still smoking some but less  Lab work reviewed with him in detail HBA1C 7.1  Weight at home, 260 to 265 at home Here is 257  EKG personally reviewed by myself on todays visit, prior EKG from the emergency room Shows normal sinus rhythm  Other past medical history reviewed In hospital 04/2017 Hospital records reviewed with the patient in detail orthostatic lightheadedness in the  setting of acute kidney injury. Creatinine 2.6 intravascular volume depletion Meds: Entresto,  torsemide to 40 mg daily,  metoprolol succinate to 50 mg daily. Spironolactone held  LVEF, now 45-50%.  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  does not have insurance, lives with his father Previously did not fill out paperwork for medical management/assistance with his medications as he does not want to give his father's information Currently unemployed Reports he is trying to [place], get disability Followed at [facility] 02/17/2016, admitted for chest pain/pneumonia He was put back on several medications for cardiomyopathy and blood pressure  Reports having sleep study in the past, was positive, did not tolerate CPAP.  Does not want to wear that, feels he sleeps fine  On prior office visit, going to the pool with his family, doing various activities Does not feel that he can work, has bills to pay, has not worked Does not have money for medications  No episodes of syncope  Previous echocardiogram reviewed with him showing improving ejection fraction up to 30-35%, previously 25% in the setting of sepsis  Echocardiogram in the hospital  in the setting of sepsis showed severely depressed ejection fraction 25% CXR showed right upper lobe consolidation c/w pneumonia.  started on IV Rocephin and azithromycin.  Troponin trend of 0.22-->0.19-->0.18-->1.05-->0.13 WBC count of 28,000 on arrival Febrile to a T max of 101.2 this  admission.  Hypotensive with BP down to the 70's shortly after admission,   Prior to discharge was started on hydralazine, isosorbide, carvedilol. Secondary to acute renal failure, was not started on ACE inhibitor or ARB He did have short runs of nonsustained VT seen on telemetry   nonsustained VT on Holter, started on amiodarone, was asymptomatic  Early June 2017, had episode of syncope in his kitchen Denied any lightheadedness or dizziness, no warning, When out for a minute witnessed by his girlfriend, woke up on the floor Went to the hospital  He wore a 48-hour Holter monitor which was reviewed with him in detail today Monitor shows short rare runs of atrial tachycardia, also short rare episodes of nonsustained VT. He does report rare fluttering in his chest like a vibration otherwise no significant symptoms  Patient was previously admitted in August 2016 and September 2016 with atypical chest pain.  Echo during August admission showed mildly reduced LV systolic function with mildly elevated troponin and BNP. He was hypertensive and unable to afford his medications at that time.   He was discharged back in August 2016 on antihypertensives, but did nto take them. During his September 2016 admission for atypical chest pain and HTN he underwent nuclear stress testing that showed a small defect of mild severity in the apex location felt to be 2/2 apical thinning. EF was estimated at 30-44%. Overall, this was an intermediate risk scan. There was no evidence of significant perfusion abnormalities and he was felt to have NICM.   PMH:   has a past medical history of Chest wall pain, chronic, Chronic systolic CHF (congestive heart failure) (HCC), Chronic Troponin Elevation, CKD (chronic kidney disease), stage III ([place]), COPD (chronic obstructive pulmonary disease) ([place]), Hypertension, NICM (nonischemic cardiomyopathy) ([place]), Non-obstructive CAD (coronary artery disease), NSVT (nonsustained  ventricular tachycardia) ([place]), Obesity (BMI 30.0-34.9), Psoriasis, Syncope, and Tobacco abuse.  PSH:    Past Surgical History:  Procedure Laterality Date  . AMPUTATION    . FINGER AMPUTATION     Traumatic  . Left Heart Cath and Coronary Angiography N/A 01/06/2017   Performed by Wellington Hampshire, MD at [facility]    Current Outpatient Medications  Medication Sig Dispense Refill  . amiodarone (PACERONE) 200 MG tablet Take 1 tablet (200 mg total) by mouth 2 (two) times daily. 180 tablet 3  . amLODipine (NORVASC) 5 MG tablet Take 1 tablet (5 mg total) daily by mouth. 30 tablet 0  . aspirin 81 MG chewable tablet Chew 1 tablet (81 mg total) by mouth daily. Reported on 02/08/2016 90 tablet 3  . metoprolol succinate (TOPROL-XL) 50 MG 24 hr tablet Take 1 tablet (50 mg total) daily by mouth. Take with or immediately following a meal. 90 tablet 3  . ranitidine (ZANTAC) 150 MG tablet Take 1 tablet (150 mg total) by mouth 2 (two) times daily. 180 tablet 3  . rosuvastatin (CRESTOR) 20 MG tablet Take 1 tablet (20 mg total) by mouth daily. 90 tablet 3  . sacubitril-valsartan (ENTRESTO) 49-51 MG Take 1 tablet 2 (two) times daily by mouth. 180 tablet 3  . spironolactone (ALDACTONE) 25 MG tablet Take 1 tablet (25 mg total) by mouth daily.  90 tablet 3  . sucralfate (CARAFATE) 1 g tablet Take 1 tablet (1 g total) by mouth 4 (four) times daily. 360 tablet 3  . torsemide (DEMADEX) 20 MG tablet Take 1 tablet (20 mg total) by mouth daily. 90 tablet 3   No current facility-administered medications for this visit.      Allergies:   Prednisone   Social History:  The patient  reports that he has been smoking Cigarettes.  He has been smoking about 2.00 packs per day for the past 33 years. He has never used smokeless tobacco. He reports that he drinks about 1.8 oz of alcohol per week. He reports that he does not use illicit drugs.   Family History:   family history includes Cancer in his maternal  grandfather and paternal aunt; Diabetes Mellitus II in his mother; Hypertension in his father.    Review of Systems: Review of Systems  Constitutional: Negative.   Respiratory: Negative.   Cardiovascular: Negative.   Gastrointestinal: Positive for abdominal pain.  Musculoskeletal: Negative.   Psychiatric/Behavioral: Negative.   All other systems reviewed and are negative.    PHYSICAL EXAM: VS:  BP 131/85 (BP Location: Right Arm, Patient Position: Sitting, Cuff Size: Large)   Pulse 66   Ht 6' (1.829 m)   Wt 257 lb (116.6 kg)   BMI 34.86 kg/m  , BMI Body mass index is 34.86 kg/m. No significant change on today's visit GEN: Well nourished, well developed, in no acute distress , obese HEENT: normal  Neck: no JVD, carotid bruits, or masses Cardiac: RRR; no murmurs, rubs, or gallops,no edema  Respiratory:  clear to auscultation bilaterally, normal work of breathing GI: soft, nontender, nondistended, + BS MS: no deformity or atrophy  Skin: warm and dry, no rash Neuro:  Strength and sensation are intact Psych: euthymic mood, full affect    Recent Labs: 01/06/2017: Magnesium 1.7 04/28/2017: B Natriuretic Peptide 25.0; TSH 2.132 05/10/2017: ALT 24 06/30/2017: BUN 22; Creatinine, Ser 1.20; Hemoglobin 17.2; Platelets 137; Potassium 3.3; Sodium 140    Lipid Panel Lab Results  Component Value Date   CHOL 74 01/04/2016   HDL 14 (L) 01/04/2016   LDLCALC 46 01/04/2016   TRIG 71 01/04/2016      Wt Readings from Last 3 Encounters:  07/07/17 257 lb (116.6 kg)  06/30/17 260 lb (117.9 kg)  06/02/17 261 lb 8 oz (118.6 kg)       ASSESSMENT AND PLAN:  Syncope, unspecified syncope type No further episodes of syncope  previous episodes concerning  for arrhythmia, sustained VT He has been maintained on metoprolol and amiodarone Now on high-dose metoprolol, we will hold the amiodarone  Cardiomyopathy ([place]) - Prior history of medication noncompliance, inability to afford his  medications  Tolerating Entresto and metoprolol succinate  improved from 25% now 45%  deconditioned, obese. Working on his exercise regimen  Essential hypertension Blood pressure is well controlled on today's visit. No changes made to the medications. Recent visit to the emergency room for hypertension Today well controlled, unable to exclude medication compliance, pain, stress No medication changes made today If blood pressure runs high recommend he take extra amlodipine  Chronic systolic heart failure (HCC) Taking torsemide 20 daily We will add potassium 10 every other day given potassium 3.3 Unsure if he was taking his Aldactone at the time of the lab work  Cad previouscardiac catheterization with nonobstructive disease, unrelated to his cardiomyopathy No further workup needed Stable    Total encounter time more than  25 minutes  Greater than 50% was spent in counseling and coordination of care with the patient    No orders of the defined types were placed in this encounter.    Signed, Esmond Plants, M.D., Ph.D. 07/07/2017  [place], [place]

## 2017-07-07 ENCOUNTER — Ambulatory Visit (INDEPENDENT_AMBULATORY_CARE_PROVIDER_SITE_OTHER): Payer: Self-pay | Admitting: Cardiovascular Disease

## 2017-07-07 ENCOUNTER — Encounter: Payer: Self-pay | Admitting: Cardiovascular Disease

## 2017-07-07 ENCOUNTER — Telehealth: Payer: Self-pay | Admitting: Family

## 2017-07-07 VITALS — BP 129/85 | HR 66 | Ht 72.0 in | Wt 257.0 lb

## 2017-07-07 DIAGNOSIS — I420 Dilated cardiomyopathy: Secondary | ICD-10-CM

## 2017-07-07 DIAGNOSIS — E119 Type 2 diabetes mellitus without complications: Secondary | ICD-10-CM

## 2017-07-07 DIAGNOSIS — J439 Emphysema, unspecified: Secondary | ICD-10-CM

## 2017-07-07 DIAGNOSIS — E782 Mixed hyperlipidemia: Secondary | ICD-10-CM

## 2017-07-07 DIAGNOSIS — I2510 Atherosclerotic heart disease of native coronary artery without angina pectoris: Secondary | ICD-10-CM

## 2017-07-07 DIAGNOSIS — I5022 Chronic systolic (congestive) heart failure: Secondary | ICD-10-CM

## 2017-07-07 MED ORDER — POTASSIUM CHLORIDE ER 10 MEQ PO TBCR: 10.0000 meq | EXTENDED_RELEASE_TABLET | Freq: Every day | ORAL | 6 refills | Status: DC

## 2017-07-07 NOTE — Telephone Encounter (Signed)
Returned patient's call. He wanted to call and update on his office visit with his cardiologist that he had today. Says that his BP is now looking good and his amiodarone has been stopped. He will keep his appointment currently scheduled with [place] in January 2019 and call us sooner for any questions/problems before then.

## 2017-07-07 NOTE — Patient Instructions (Addendum)
Medication Instructions:   Please stop the amiodarone  Labwork:  No new labs needed  Testing/Procedures:  No further testing at this time   Follow-Up: It was a pleasure seeing you in the office today. Please call us if you have new issues that need to be addressed before your next appt.  [phone]  Your physician wants you to follow-up in: 6 months.  You will receive a reminder letter in the mail two months in advance. If you don't receive a letter, please call our office to schedule the follow-up appointment.  If you need a refill on your cardiac medications before your next appointment, please call your pharmacy.

## 2017-07-07 NOTE — Telephone Encounter (Signed)
Patient confirmed all blood pressure medications that he is taking. He reports that his blood pressures remain 063/494'J systolic and 447'X for diastolic. Patient states that he is taking medications as prescribed and is still having problems. He is currently on his way in to appointment here in our office and that he will see [place] in a few. Reviewed that [name] thought we may be able to adjust his medications and then reschedule his appointment but he states that he is on his way in. He was appreciative for the call and had no further questions at this time.

## 2017-07-07 NOTE — Telephone Encounter (Signed)
-----   Message from Minna Merritts, MD sent at 07/05/2017 11:44 AM EST ----- He does not need a visit Monday AM to adjust meds if he is not taking them Would delay visit a few weeks Have him pick up entresto 49/51 take twice a day with other meds he is supposed to be on Call with BP numbers He is med noncompliant TG

## 2017-07-15 IMAGING — DX DG CHEST 1V PORT
1 series · 1 of 1 positions shown · non-contrast
Comparison: 01/04/16

CLINICAL DATA: Chest pain

EXAM:
PORTABLE CHEST 1 VIEW

[chest ap]
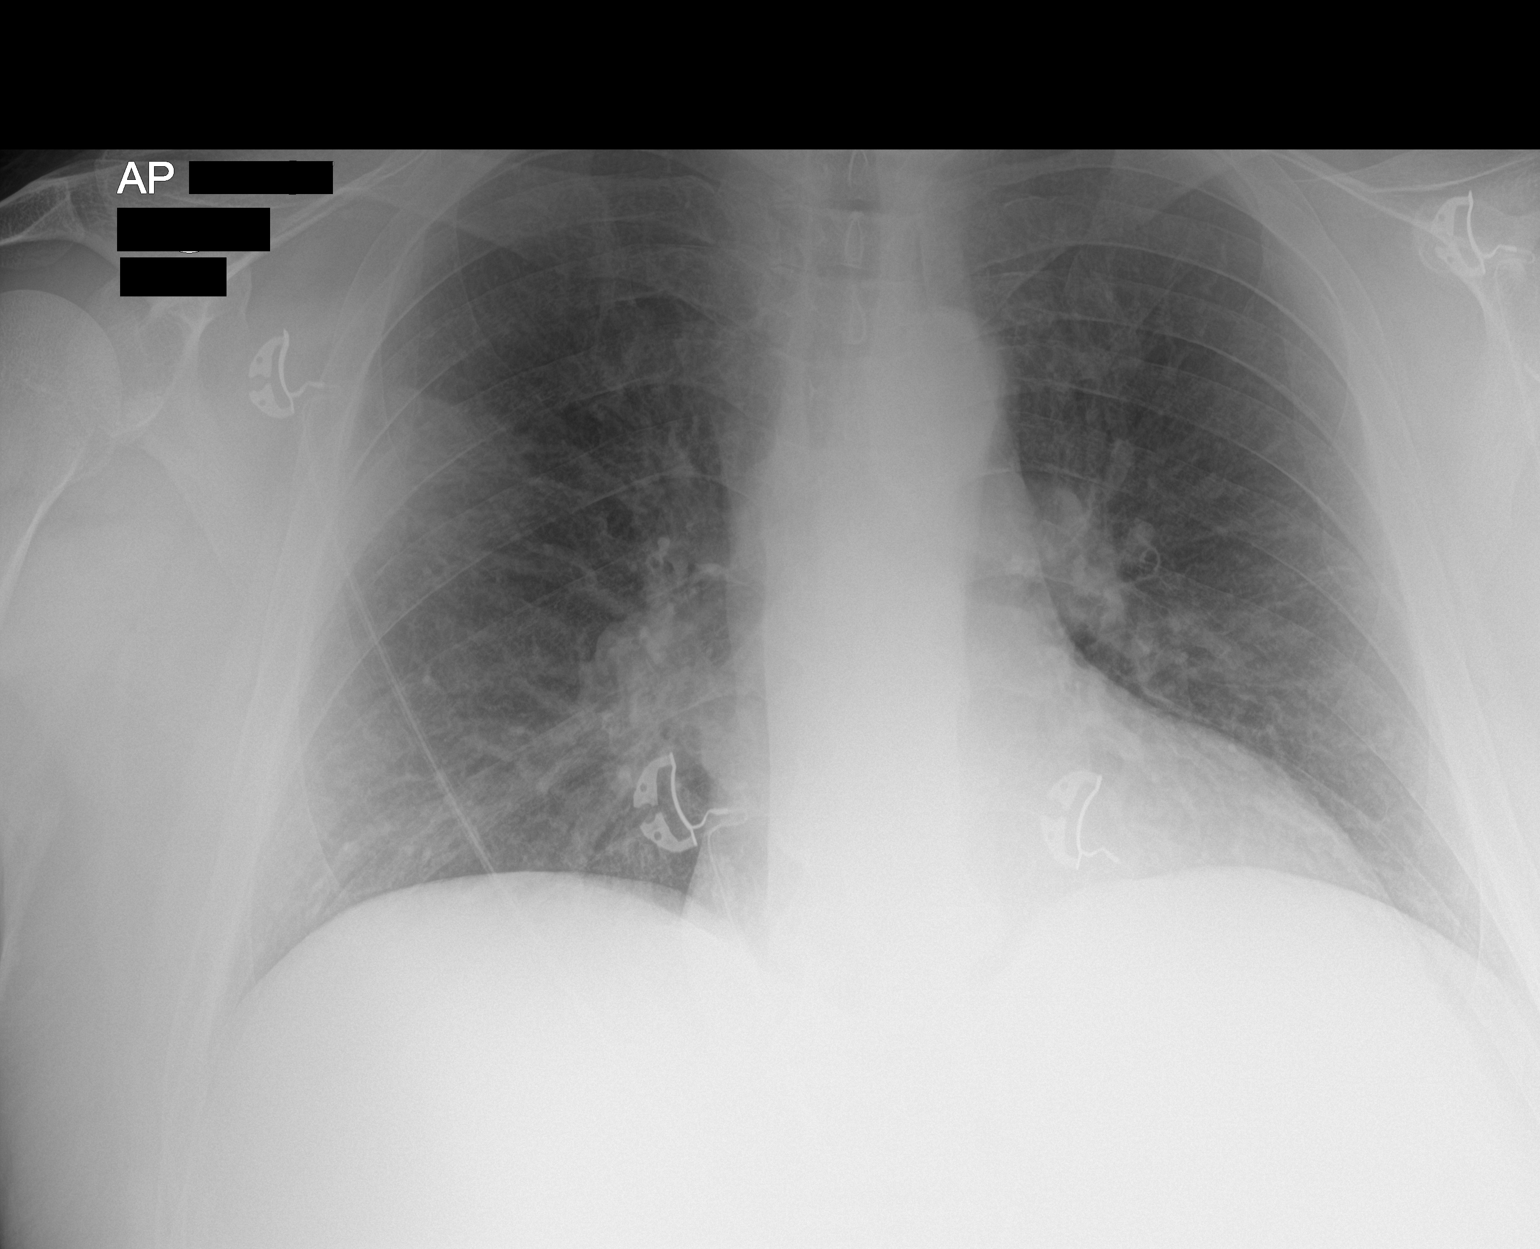

[1 of 1 positions shown; findings below may reference images not displayed]

FINDINGS: Cardiac shadow is stable. The right jugular line is been removed in
the interval. Some mild residual infiltrate is noted in the right
upper lobe when compared with the prior exam but significantly
improved. No new focal infiltrate is seen. Continued follow-up is
recommended. No bony abnormality is noted.
IMPRESSION: Persistent but significantly improved right upper lobe infiltrate.
No new focal abnormality is seen. Continued follow-up is
recommended.

## 2017-07-16 ENCOUNTER — Emergency Department
Admission: EM | Admit: 2017-07-16 | Discharge: 2017-07-16 | Disposition: A | Discharge status: Home / Self Care | Payer: Self-pay | Attending: Emergency Medicine | Admitting: Emergency Medicine

## 2017-07-16 ENCOUNTER — Encounter: Payer: Self-pay | Admitting: Emergency Medicine

## 2017-07-16 DIAGNOSIS — J449 Chronic obstructive pulmonary disease, unspecified: Secondary | ICD-10-CM | POA: Insufficient documentation

## 2017-07-16 DIAGNOSIS — E1122 Type 2 diabetes mellitus with diabetic chronic kidney disease: Secondary | ICD-10-CM | POA: Insufficient documentation

## 2017-07-16 DIAGNOSIS — M545 Low back pain, unspecified: Secondary | ICD-10-CM

## 2017-07-16 DIAGNOSIS — I5022 Chronic systolic (congestive) heart failure: Secondary | ICD-10-CM | POA: Insufficient documentation

## 2017-07-16 DIAGNOSIS — I2510 Atherosclerotic heart disease of native coronary artery without angina pectoris: Secondary | ICD-10-CM | POA: Insufficient documentation

## 2017-07-16 DIAGNOSIS — I130 Hypertensive heart and chronic kidney disease with heart failure and stage 1 through stage 4 chronic kidney disease, or unspecified chronic kidney disease: Secondary | ICD-10-CM | POA: Insufficient documentation

## 2017-07-16 DIAGNOSIS — N183 Chronic kidney disease, stage 3 (moderate): Secondary | ICD-10-CM | POA: Insufficient documentation

## 2017-07-16 DIAGNOSIS — Z79899 Other long term (current) drug therapy: Secondary | ICD-10-CM | POA: Insufficient documentation

## 2017-07-16 DIAGNOSIS — Z7982 Long term (current) use of aspirin: Secondary | ICD-10-CM | POA: Insufficient documentation

## 2017-07-16 LAB — CBC
HCT: 51 % (ref 40.0–52.0)
Hemoglobin: 17.5 g/dL (ref 13.0–18.0)
MCH: 28.7 pg (ref 26.0–34.0)
MCHC: 34.3 g/dL (ref 32.0–36.0)
MCV: 83.6 fL (ref 80.0–100.0)
Platelets: 157 10*3/uL (ref 150–440)
RBC: 6.1 MIL/uL — ABNORMAL HIGH (ref 4.40–5.90)
RDW: 14 % (ref 11.5–14.5)
WBC: 10.5 10*3/uL (ref 3.8–10.6)

## 2017-07-16 LAB — URINALYSIS, COMPLETE (UACMP) WITH MICROSCOPIC
Bacteria, UA: NONE SEEN
Bilirubin Urine: NEGATIVE
Glucose, UA: >=500 mg/dL — AB
Ketones, ur: NEGATIVE mg/dL
Leukocytes, UA: NEGATIVE
Nitrite: NEGATIVE
Protein, ur: 100 mg/dL — AB
Specific Gravity, Urine: 1.011 (ref 1.005–1.030)
Squamous Epithelial / HPF: NONE SEEN
pH: 5 (ref 5.0–8.0)

## 2017-07-16 LAB — COMPREHENSIVE METABOLIC PANEL
ALT: 40 U/L (ref 17–63)
AST: 25 U/L (ref 15–41)
Albumin: 4.3 g/dL (ref 3.5–5.0)
Alkaline Phosphatase: 91 U/L (ref 38–126)
Anion gap: 12 (ref 5–15)
BUN: 29 mg/dL — ABNORMAL HIGH (ref 6–20)
CO2: 21 mmol/L — ABNORMAL LOW (ref 22–32)
Calcium: 9.5 mg/dL (ref 8.9–10.3)
Chloride: 99 mmol/L — ABNORMAL LOW (ref 101–111)
Creatinine, Ser: 1.46 mg/dL — ABNORMAL HIGH (ref 0.61–1.24)
GFR calc Af Amer: >60 mL/min (ref >60)
GFR calc non Af Amer: 55 mL/min — ABNORMAL LOW (ref >60)
Glucose, Bld: 330 mg/dL — ABNORMAL HIGH (ref 65–99)
Potassium: 4 mmol/L (ref 3.5–5.1)
Sodium: 132 mmol/L — ABNORMAL LOW (ref 135–145)
Total Bilirubin: 1.4 mg/dL — ABNORMAL HIGH (ref 0.3–1.2)
Total Protein: 8.1 g/dL (ref 6.5–8.1)

## 2017-07-16 LAB — LIPASE, BLOOD: Lipase: 45 U/L (ref 11–51)

## 2017-07-16 MED ORDER — SODIUM CHLORIDE 0.9 % IV BOLUS (SEPSIS)
1000.0000 mL | Freq: Once | INTRAVENOUS | Status: AC
  Administered 2017-07-16: 1000 mL via INTRAVENOUS

## 2017-07-16 MED ORDER — LIDOCAINE 5 % EX PTCH
1.0000 | MEDICATED_PATCH | CUTANEOUS | Status: DC
  Administered 2017-07-16: 1 via TRANSDERMAL
  Filled 2017-07-16: qty 1

## 2017-07-16 MED ORDER — ETODOLAC ER 500 MG PO TB24: 500.0000 mg | ORAL_TABLET | Freq: Every day | ORAL | 0 refills | Status: AC

## 2017-07-16 MED ORDER — ONDANSETRON HCL 4 MG/2ML IJ SOLN
4.0000 mg | Freq: Once | INTRAMUSCULAR | Status: AC
  Administered 2017-07-16: 4 mg via INTRAVENOUS
  Filled 2017-07-16: qty 2

## 2017-07-16 MED ORDER — OXYCODONE-ACETAMINOPHEN 5-325 MG PO TABS
2.0000 | ORAL_TABLET | Freq: Once | ORAL | Status: AC
  Administered 2017-07-16: 2 via ORAL
  Filled 2017-07-16: qty 2

## 2017-07-16 MED ORDER — TRAMADOL HCL 50 MG PO TABS: 50.0000 mg | ORAL_TABLET | Freq: Four times a day (QID) | ORAL | 0 refills | Status: AC | PRN

## 2017-07-16 MED ORDER — MORPHINE SULFATE (PF) 4 MG/ML IV SOLN
4.0000 mg | Freq: Once | INTRAVENOUS | Status: AC
  Administered 2017-07-16: 4 mg via INTRAVENOUS
  Filled 2017-07-16: qty 1

## 2017-07-16 MED ORDER — CYCLOBENZAPRINE HCL 10 MG PO TABS: 10.0000 mg | ORAL_TABLET | Freq: Three times a day (TID) | ORAL | 0 refills | Status: AC | PRN

## 2017-07-16 MED ORDER — KETOROLAC TROMETHAMINE 30 MG/ML IJ SOLN
30.0000 mg | Freq: Once | INTRAMUSCULAR | Status: AC
  Administered 2017-07-16: 30 mg via INTRAVENOUS
  Filled 2017-07-16: qty 1

## 2017-07-16 NOTE — ED Notes (Signed)
Topaz not found - rebooted, still no sig.

## 2017-07-16 NOTE — ED Notes (Addendum)
Pt states abd pain as well with the back pain. Pt states tenderness with his "stomach since I've been on metformin." Pt states "I've been on the metformin a little over a month." Denies N/V or urinary changes.

## 2017-07-16 NOTE — ED Triage Notes (Signed)
Pt arrived via ems from home with complaints of lower back pain that started several weeks ago.  Pt reports pain as a stabbing sensation. No known injury. Pt rates pain at a 10 on a 0-10 scale. Pt alert & oriented x 4.

## 2017-07-16 NOTE — ED Notes (Signed)
Pt states he is unable to void at this time.

## 2017-07-16 NOTE — ED Provider Notes (Signed)
-----------------------------------------   9:35 AM on 07/16/2017 -----------------------------------------  I received signout on this patient from Dr. Dahlia Client.  Patient had likely muscular back pain, with negative workup.  Pending urinalysis.  The UA is back and is negative.  Patient reports improved pain.  He feels comfortable to go home.  Based on [facility]'s plan, we will discharge with NSAID, muscle relaxant and tramadol.  Return precautions given.  Patient expresses understanding.   Arta Silence, MD 07/16/17 [phone]

## 2017-07-16 NOTE — Discharge Instructions (Signed)
Return to the ER for new, worsening, or persistent severe back pain, weakness or numbness, difficulty walking, urinary symptoms, or any other new or worsening symptoms that concern you.  Follow-up with your primary care doctor.  You may use warm compresses on the area that hurts, and you should try to resume normal daily activities within the next 1-2 days.

## 2017-07-16 NOTE — ED Provider Notes (Signed)
[facility] Emergency Department Provider Note   ____________________________________________   First MD Initiated Contact with Patient 07/16/17 0515     (approximate)  I have reviewed the triage vital signs and the nursing notes.   HISTORY  Chief Complaint Back Pain    HPI Gabriel [name]. is a 46 y.o. male who comes into the hospital today with some back pain.  The patient reports that it does not matter what position he is and it does not get better.  He reports it is been hurting for a week but has gotten really bad in the past 24 hours.  He reports that he could get comfortable around 8 PM.  The pain is in his bilateral lower back.  He has not taken anything for pain because he reports that over-the-counter medicines do not usually work for him.  He has had back pains in the past but has never had it evaluated.  The patient denies any problems when he urinates or has any bowel movements.  His pain is a 10 out of 10 in intensity currently.  The patient decided to come in and get checked out for this back pain.  He reports that only morphine works for his pain.  Past Medical History:  Diagnosis Date  . Chest wall pain, chronic   . Chronic systolic CHF (congestive heart failure) ([place])    a. 03/2015 Echo: EF 45-50%; b. 12/2015 Echo: EF 20-25%; c. 02/2016 Echo: EF 30-35%; d. 11/2016 Echo: EF 40-45%.  . Chronic Troponin Elevation   . CKD (chronic kidney disease), stage III ([place])   . COPD (chronic obstructive pulmonary disease) ([place])   . Hypertension    Resolved since weight loss  . NICM (nonischemic cardiomyopathy) ([place])    a. 03/2015 Echo: EF 45-50%; b. 04/2015 MV: small defect of mild severity in apex 2/2 apical thinning, EF 30-44%;  c. 12/2015 Echo: EF 20-25%; d. 02/2016 Echo: EF 30-35%; e. 11/2016 Echo: EF 40-45%, diff HK, mildly dil Ao root, mild MR, mod dil LA;  f. 12/2016 Cath: LM nl, LAD/Diags/LCX/OMs min irregs, RCA 40p/m/d.  . Non-obstructive CAD (coronary  artery disease)    a. 04/2015 low risk MV;  b. 12/2016 Cath: minor irregs in LAD/Diag/LCX/OM, RCA 40p/m/d.  [name] (nonsustained ventricular tachycardia) ([place])    a. 12/2015 noted on tele-->amio;  b. 12/2015 Event monitor: no VT. Atach noted.  . Obesity (BMI 30.0-34.9)   . Psoriasis   . Syncope    a. 01/2016 - felt to be vasovagal.  . Tobacco abuse     Patient Active Problem List   Diagnosis Date Noted  . Noncompliance with medications 04/29/2017  . Back pain 03/06/2017  . Tobacco use 12/04/2016  . Gallbladder sludge 12/04/2016  . HCAP (healthcare-associated pneumonia) 03/15/2016  . Coronary artery disease, non-occlusive 03/15/2016  . Atypical chest pain 02/16/2016  . Orthostatic hypotension 01/23/2016  . Diarrhea 01/23/2016  . Hypokalemia 01/23/2016  . Hypomagnesemia 01/23/2016  . Congestive dilated cardiomyopathy ([place]) 01/17/2016  . NSVT (nonsustained ventricular tachycardia) ([place])   . 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 CAP (community acquired pneumonia)   . Arterial hypotension   . Elevated troponin   . Chronic systolic heart failure ([place])   . Sepsis ([place]) 01/03/2016  . Diabetes ([place]) 05/17/2015  . Hyperlipidemia 05/17/2015  . COPD (chronic obstructive pulmonary disease) ([place]) 05/17/2015  . HTN (hypertension) 03/31/2015    Past Surgical History:  Procedure Laterality Date  . AMPUTATION    .  FINGER AMPUTATION     Traumatic  . LEFT HEART CATH AND CORONARY ANGIOGRAPHY N/A 01/06/2017   Procedure: Left Heart Cath and Coronary Angiography;  Surgeon: [place] Hampshire, MD;  Location: [facility];  Service: Cardiovascular;  Laterality: N/A;    Prior to Admission medications   Medication Sig Start Date End Date Taking? Authorizing Provider  amLODipine (NORVASC) 5 MG tablet Take 1 tablet (5 mg total) daily by mouth. 06/30/17 06/30/18 Yes Eula Listen, MD  aspirin 81 MG chewable tablet Chew 1 tablet (81 mg total) by mouth daily. Reported on 02/08/2016  06/02/17  Yes [name] A, FNP  metoprolol succinate (TOPROL-XL) 50 MG 24 hr tablet Take 1 tablet (50 mg total) daily by mouth. Take with or immediately following a meal. Patient taking differently: Take 50 mg by mouth 2 (two) times daily.  06/27/17 09/25/17 Yes Gollan, Kathlene November, MD  potassium chloride (K-DUR) 10 MEQ tablet Take 1 tablet (10 mEq total) daily by mouth. 07/07/17  Yes Gollan, Kathlene November, MD  ranitidine (ZANTAC) 150 MG tablet Take 1 tablet (150 mg total) by mouth 2 (two) times daily. 06/04/17  Yes Hackney, Otila Kluver A, FNP  rosuvastatin (CRESTOR) 20 MG tablet Take 1 tablet (20 mg total) by mouth daily. 06/02/17 08/31/17 Yes Hackney, Tina A, FNP  sacubitril-valsartan (ENTRESTO) 49-51 MG Take 1 tablet 2 (two) times daily by mouth. 06/27/17  Yes Minna Merritts, MD  spironolactone (ALDACTONE) 25 MG tablet Take 1 tablet (25 mg total) by mouth daily. 06/02/17  Yes Hackney, Otila Kluver A, FNP  torsemide (DEMADEX) 20 MG tablet Take 1 tablet (20 mg total) by mouth daily. 06/02/17  Yes Hackney, Otila Kluver A, FNP  sucralfate (CARAFATE) 1 g tablet Take 1 tablet (1 g total) by mouth 4 (four) times daily. Patient not taking: Reported on 07/16/2017 06/02/17   Alisa Graff, FNP    Allergies Prednisone  Family History  Problem Relation Age of Onset  . Diabetes Mellitus II Mother   . Hypertension Father   . Cancer Paternal Aunt   . Cancer Maternal Grandfather     Social History Social History   Tobacco Use  . Smoking status: Current Some Day Smoker    Packs/day: 0.50    Years: 33.00    Pack years: 16.50    Types: Cigarettes  . Smokeless tobacco: Never Used  . Tobacco comment: currently smoking a few cigs/day.  Substance Use Topics  . Alcohol use: Yes    Alcohol/week: 1.8 oz    Types: 3 Cans of beer per week    Comment: occassionally  . Drug use: No    Review of Systems  Constitutional: No fever/chills Eyes: No visual changes. ENT: No sore throat. Cardiovascular: Denies chest  pain. Respiratory: Denies shortness of breath. Gastrointestinal: No abdominal pain.  No nausea, no vomiting.  No diarrhea.  No constipation. Genitourinary: Negative for dysuria. Musculoskeletal:  back pain. Skin: Negative for rash. Neurological: Negative for headaches, focal weakness or numbness.   ____________________________________________   PHYSICAL EXAM:  VITAL SIGNS: ED Triage Vitals  Enc Vitals Group     BP 07/16/17 0514 (!) 187/107     Pulse Rate 07/16/17 0514 92     Resp 07/16/17 0514 18     Temp 07/16/17 0514 98.3 F (36.8 C)     Temp Source 07/16/17 0514 Oral     SpO2 07/16/17 0514 95 %     Weight 07/16/17 0515 260 lb (117.9 kg)     Height 07/16/17  0515 6' (1.829 m)     Head Circumference --      Peak Flow --      Pain Score 07/16/17 0514 10     Pain Loc --      Pain Edu? --      Excl. in [place]? --     Constitutional: Alert and oriented. Well appearing and in moderate distress. Eyes: Conjunctivae are normal. PERRL. EOMI. Head: Atraumatic. Nose: No congestion/rhinnorhea. Mouth/Throat: Mucous membranes are moist.  Oropharynx non-erythematous. Cardiovascular: Normal rate, regular rhythm. Grossly normal heart sounds.  Good peripheral circulation. Respiratory: Normal respiratory effort.  No retractions. Lungs CTAB. Gastrointestinal: Soft and nontender. No distention.  Positive bowel sounds  Musculoskeletal: lower back tender to palpation in the paraspinous muscles and midline.   Neurologic:  Normal speech and language.  Skin:  Skin is warm, dry and intact.  Psychiatric: Mood and affect are normal.   ____________________________________________   LABS (all labs ordered are listed, but only abnormal results are displayed)  Labs Reviewed  CBC - Abnormal; Notable for the following components:      Result Value   RBC 6.10 (*)    All other components within normal limits  COMPREHENSIVE METABOLIC PANEL - Abnormal; Notable for the following components:   Sodium  132 (*)    Chloride 99 (*)    CO2 21 (*)    Glucose, Bld 330 (*)    BUN 29 (*)    Creatinine, Ser 1.46 (*)    Total Bilirubin 1.4 (*)    GFR calc non Af Amer 55 (*)    All other components within normal limits  LIPASE, BLOOD  URINALYSIS, COMPLETE (UACMP) WITH MICROSCOPIC   ____________________________________________  EKG  none ____________________________________________  RADIOLOGY  No results found.  ____________________________________________   PROCEDURES  Procedure(s) performed: None  Procedures  Critical Care performed: No  ____________________________________________   INITIAL IMPRESSION / ASSESSMENT AND PLAN / ED COURSE  As part of my medical decision making, I reviewed the following data within the electronic MEDICAL RECORD NUMBER Notes from prior ED visits and [place] Controlled Substance Database   This is a 46 year old male who comes into the hospital today with some back pain.  The patient does have a history of back pain.  He had a little bit of abdominal pain as well when I examined him so I did check some blood work.  Patient's blood sugar is elevated and his creatinine is 1.46 which is mildly more elevated than previous.  The patient did receive a dose of morphine, Zofran, Toradol, Percocet and Lidoderm patch.  The patient also received a liter of normal saline so we can get some urine on him.  The patient's care will be signed out to Dr. Cherylann Banas who will follow up the results of the urinalysis and reassess the patient.  Clinical Course as of Jul 16 910  Wed Jul 16, 2017  0727 HCT: 51.0 [SS]    Clinical Course User Index [SS] Arta Silence, MD     ____________________________________________   FINAL CLINICAL IMPRESSION(S) / ED DIAGNOSES  Final diagnoses:  Acute bilateral low back pain without sciatica     ED Discharge Orders    None       Note:  This document was prepared using Dragon voice recognition software and may include  unintentional dictation errors.    Loney Hering, MD 07/16/17 [phone]

## 2017-07-18 ENCOUNTER — Telehealth: Payer: Self-pay | Admitting: Pharmacist

## 2017-07-18 NOTE — Telephone Encounter (Signed)
07/18/17 Received script & page 3 of application for Dose Increase of Entresto 49/51 Take 1 tablet by mouth 2 times a day; diagnosis code I10.0 & I50.22. Faxed to [employer] to up and process for shipment.Gabriel [name]

## 2017-07-25 IMAGING — US US ABDOMEN LIMITED
1 series · 14 of 25 positions shown · non-contrast
Comparison: Multiple exams, including 11/19/2012

CLINICAL DATA: Jaundice.

EXAM:
US ABDOMEN LIMITED - RIGHT UPPER QUADRANT

[Series 1: us abdomen limited · 0.25mm/px · 14 of 56 slices shown]
[im 1/56]
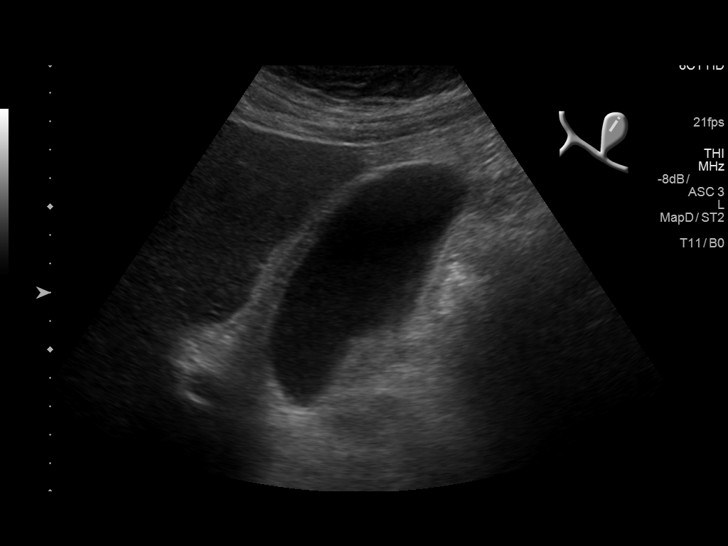
[im 5/56]
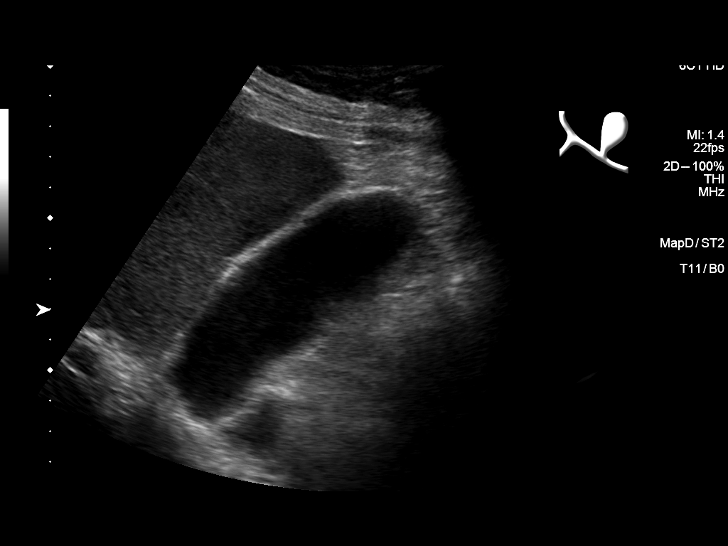
[im 10/56]
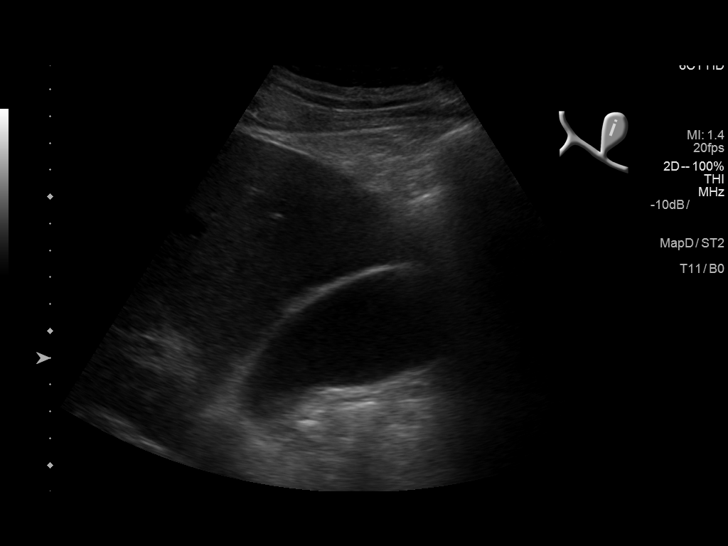
[im 14/56]
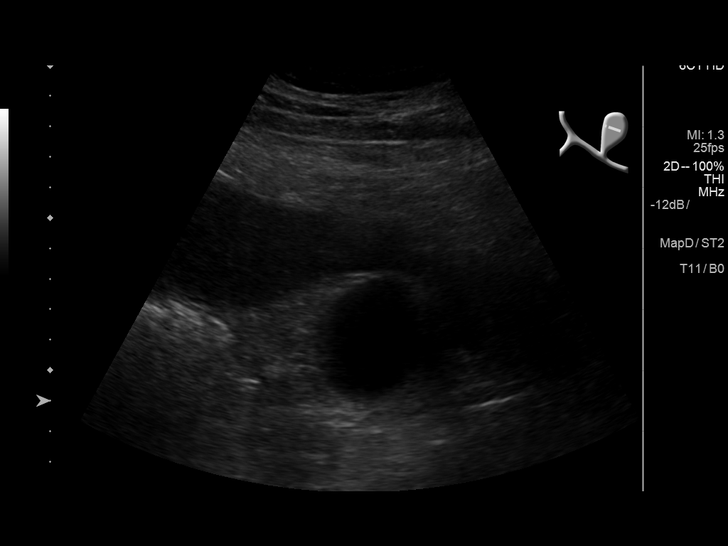
[im 19/56]
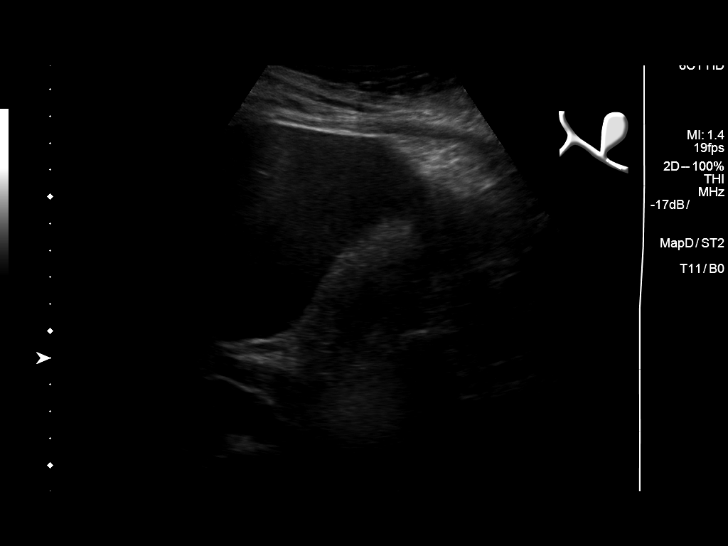
[im 21/56]
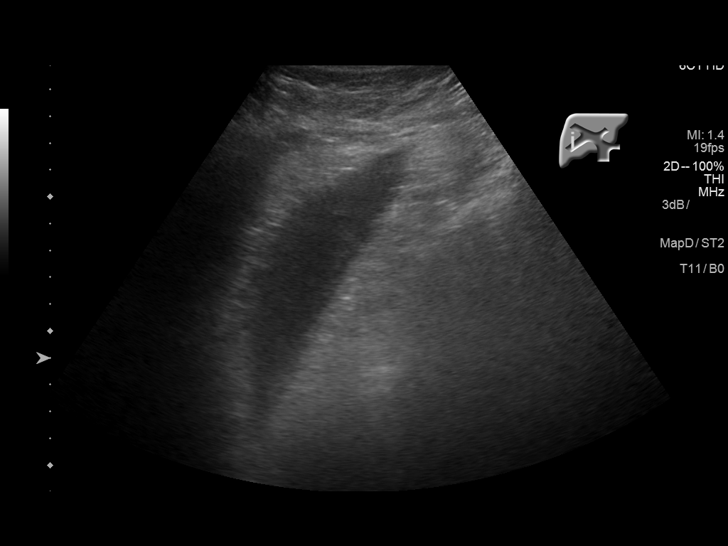
[im 26/56]
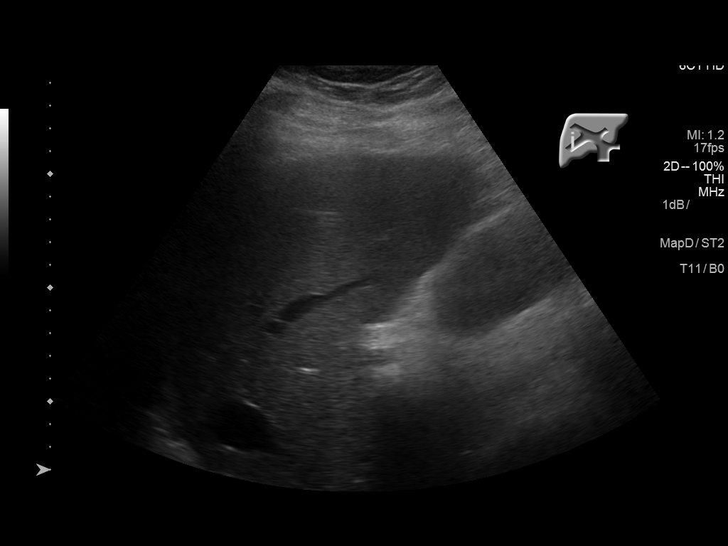
[im 30/56]
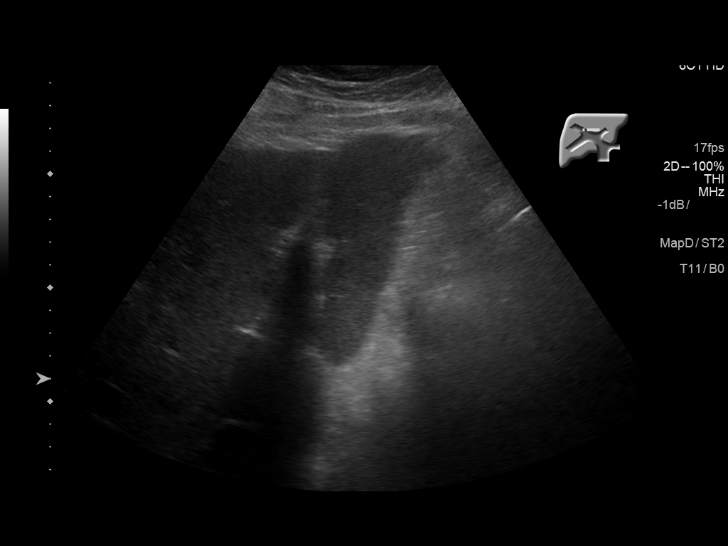
[im 35/56]
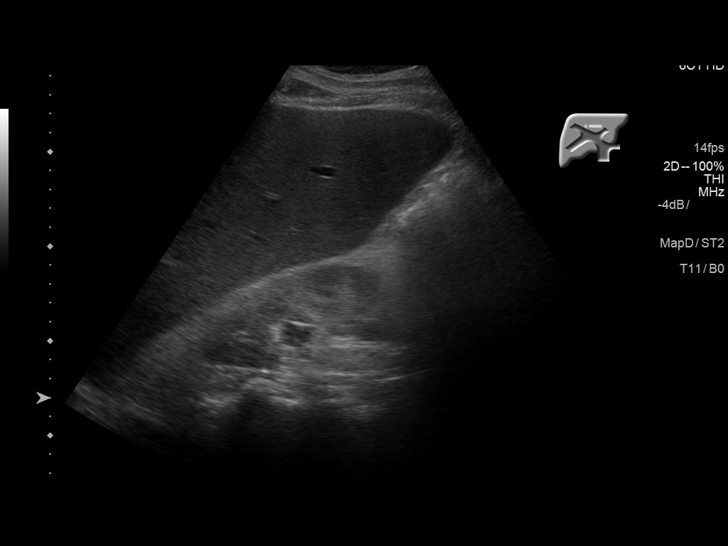
[im 37/56]
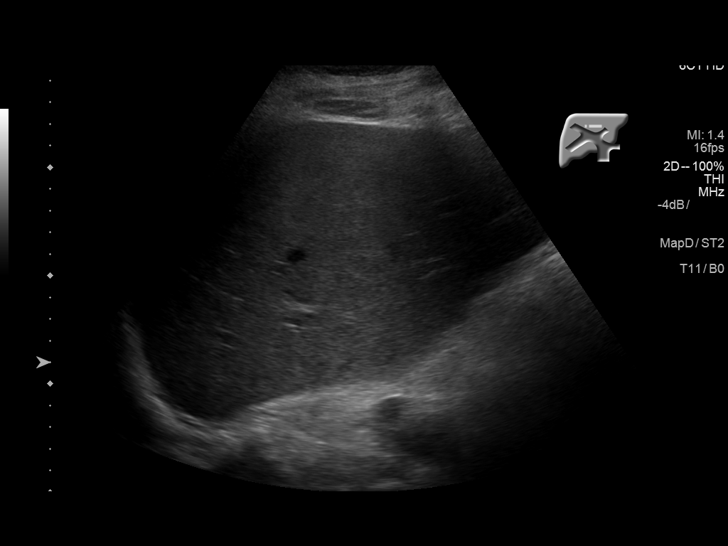
[im 42/56]
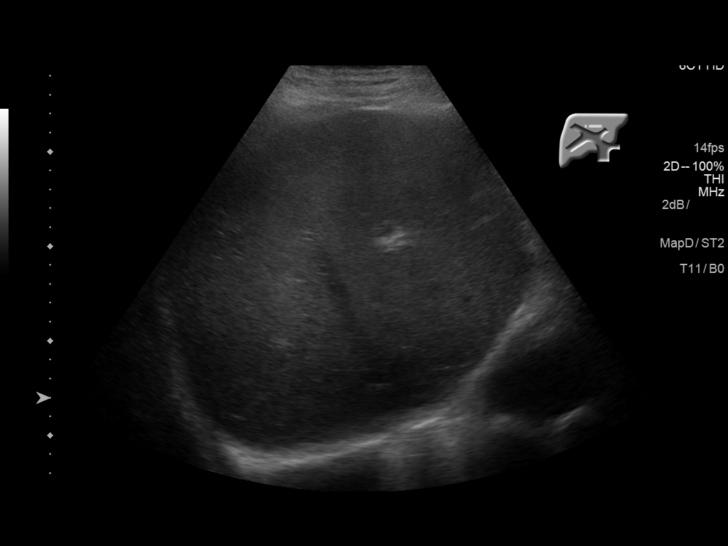
[im 46/56]
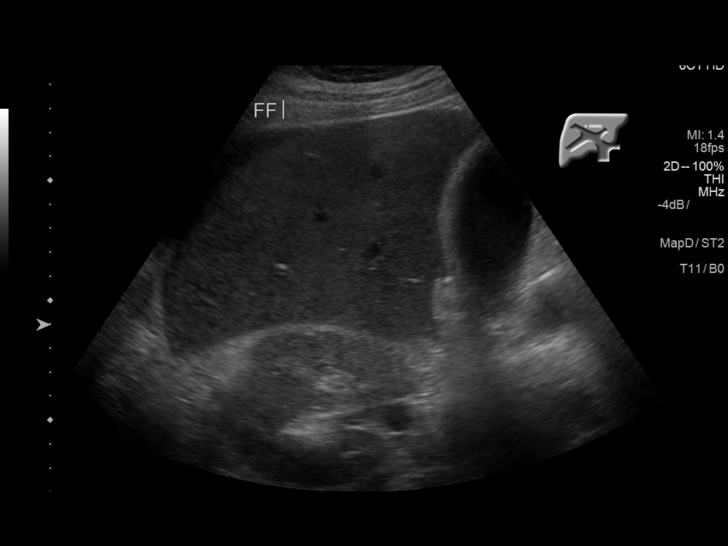
[im 51/56]
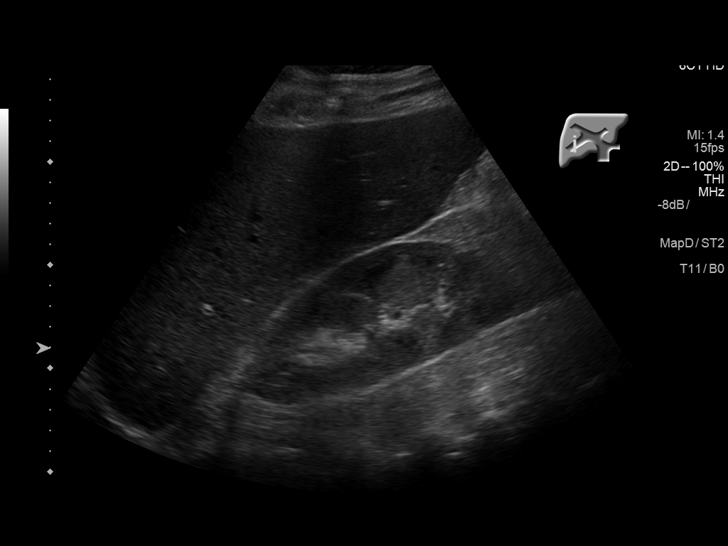
[im 56/56]
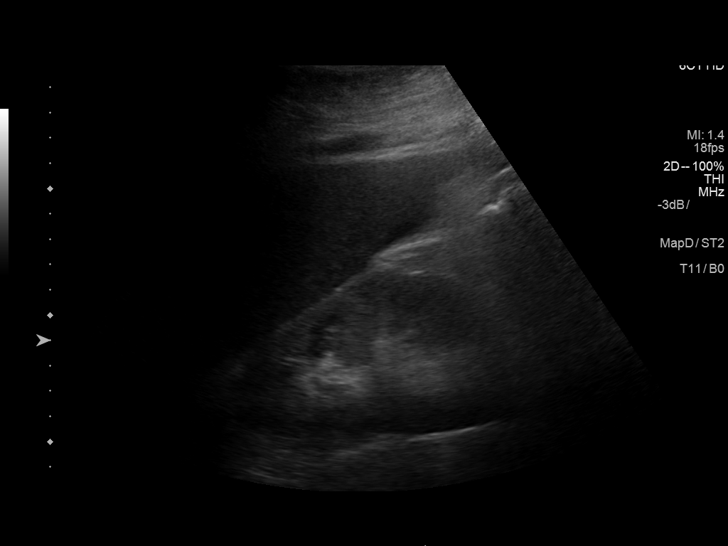

[14 of 25 positions shown; findings below may reference images not displayed]

FINDINGS: Gallbladder:

Gallbladder wall thickening at 5 mm. Small amount of pericholecystic
fluid and perihepatic fluid. The dependent non shadowing filling
defect in the gallbladder compatible with sludge. No visualized
gallstones.

Common bile duct:

Diameter: 5 mm.  No directly visualized choledocholithiasis.

Liver:

No focal lesion identified. Coarse echogenic liver with poor sonic
penetration compatible with diffuse hepatic steatosis.

Incidentally, the right kidney appears to be slightly more echogenic
than the liver.
IMPRESSION: 1. Gallbladder wall thickening with pericholecystic fluid. Sludge in
the gallbladder without sonographic Murphy sign or gallstones.
Correlate clinically in assessing for acute cholecystitis.
2. Small amount of perihepatic ascites.
3. Coarse echogenic liver with poor sonic penetration compatible
with diffuse hepatic steatosis.
4. Echogenic right kidney, nonspecific. Correlate with renal
function is in assessing for glomerulonephritis or other potential
causes.

## 2017-08-08 IMAGING — CT CT ANGIO CHEST
2 of 6 series · 18 of 46 positions shown · IV contrast (APPLIED)
Comparison: Chest radiograph dated 02/15/2016

CLINICAL DATA: 45-year-old male With sudden onset left-sided chest
pain and shortness of breath

EXAM:
CT ANGIOGRAPHY CHEST WITH CONTRAST
TECHNIQUE: Multidetector CT imaging of the chest was performed using the
standard protocol during bolus administration of intravenous
contrast. Multiplanar CT image reconstructions and MIPs were
obtained to evaluate the vascular anatomy.
CONTRAST:  100 cc Isovue 370

[Series 5: pe thins 1.5 · axial · 0.68mm/px · z∈[-394,-106]mm · 15 of 270 slices shown]
[im 15/270  lung]
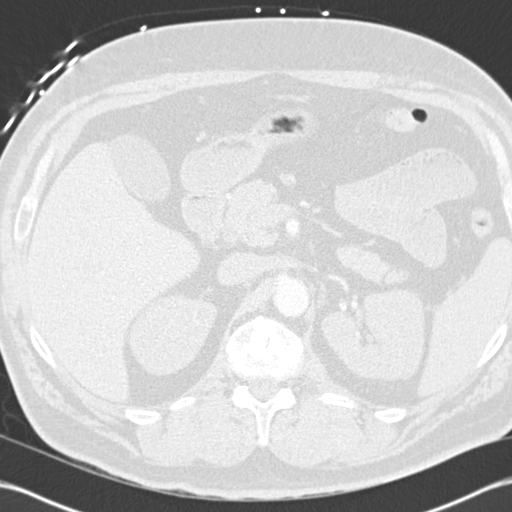
[im 29/270  soft-tissue]
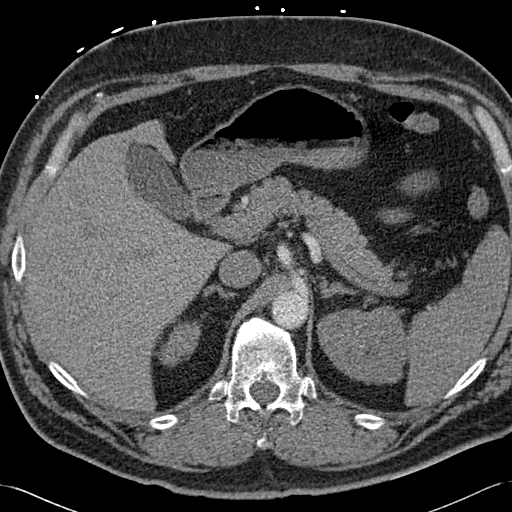
[im 57/270  lung]
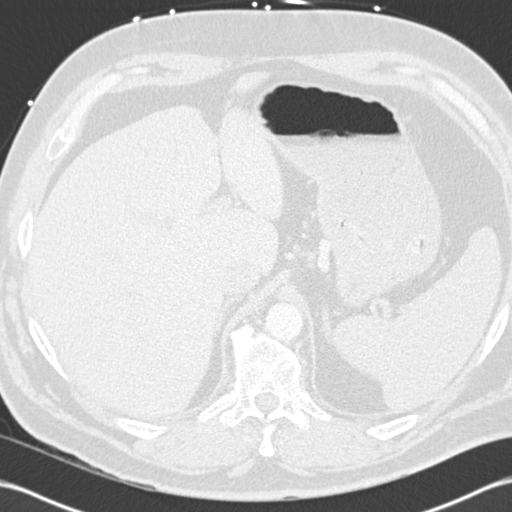
[im 71/270  soft-tissue]
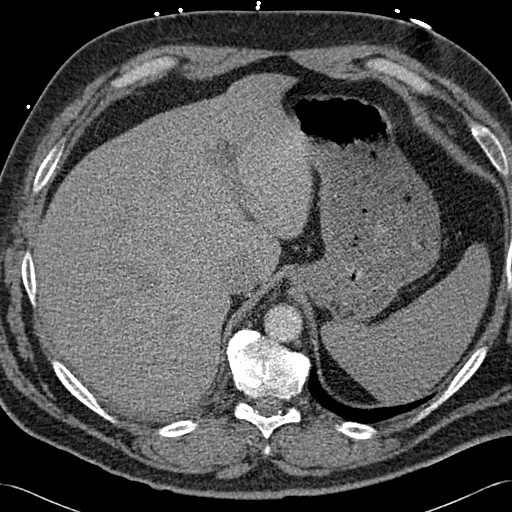
[im 85/270  lung]
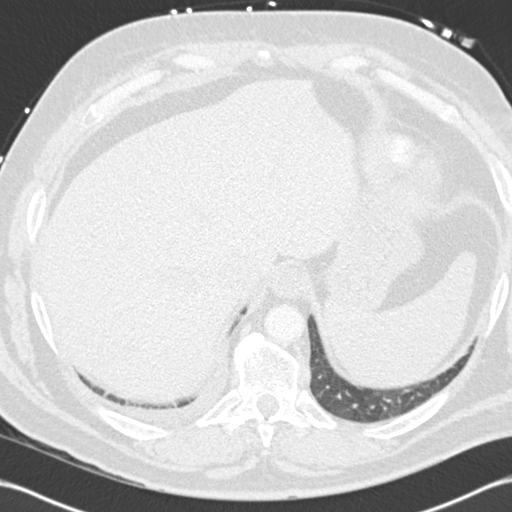
[im 100/270  soft-tissue]
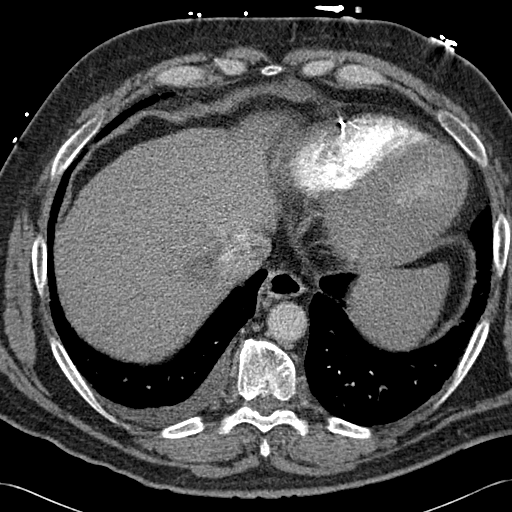
[im 114/270  lung]
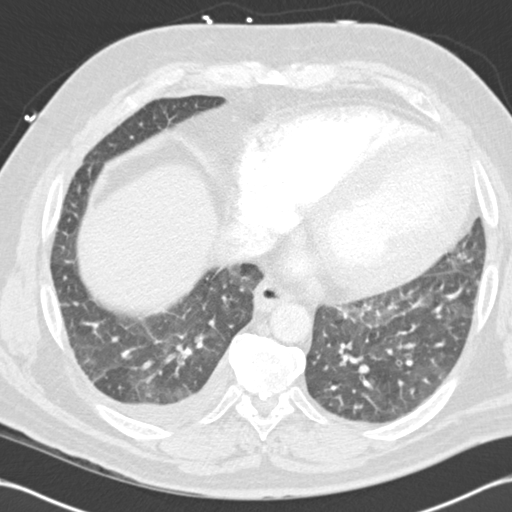
[im 142/270  soft-tissue]
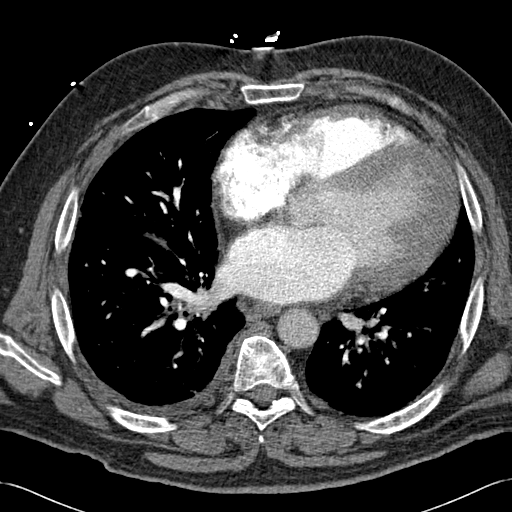
[im 156/270  lung]
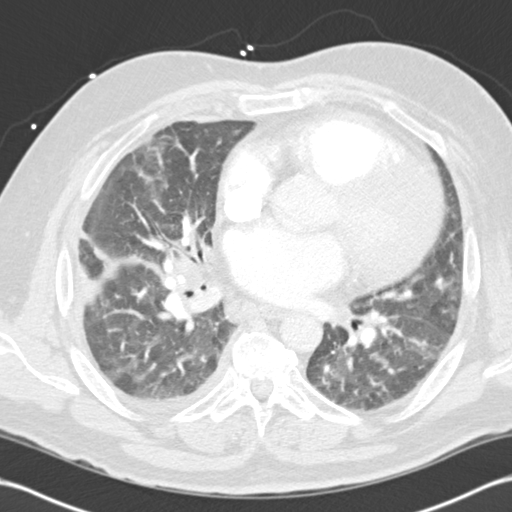
[im 170/270  soft-tissue]
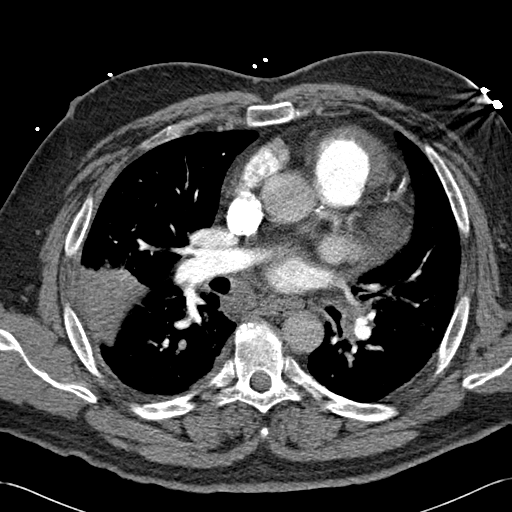
[im 185/270  lung]
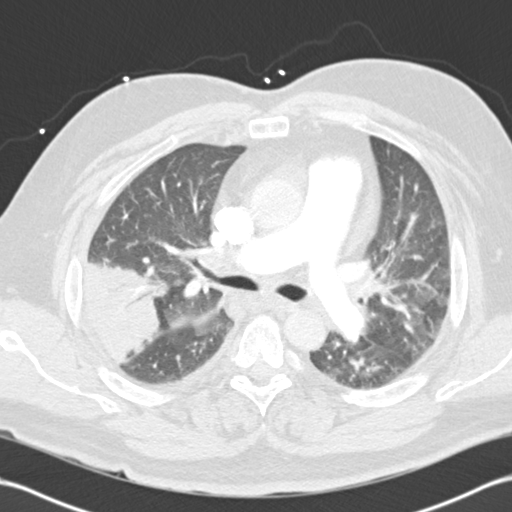
[im 199/270  soft-tissue]
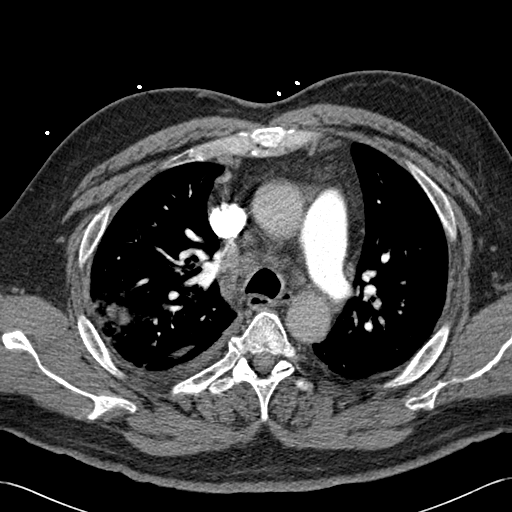
[im 227/270  lung]
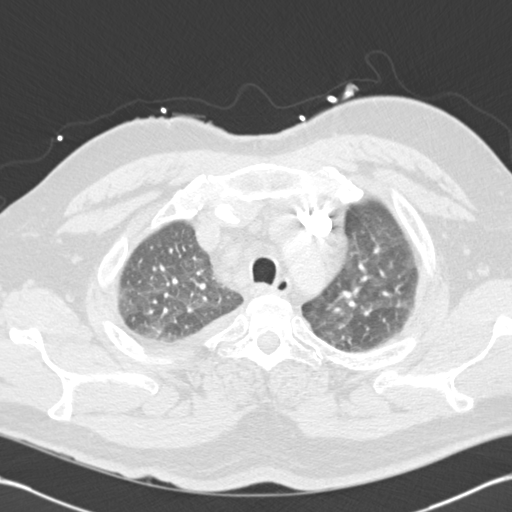
[im 241/270  soft-tissue]
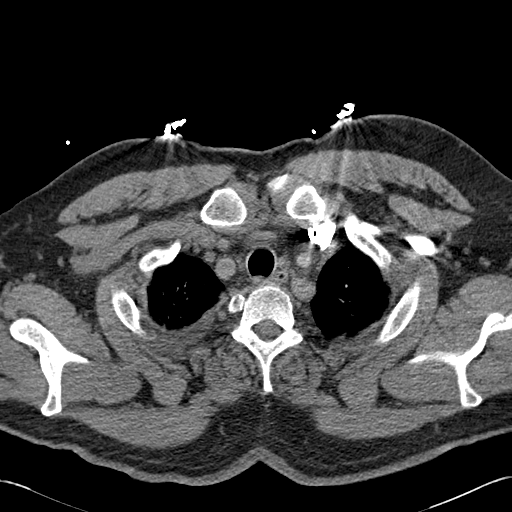
[im 255/270  lung]
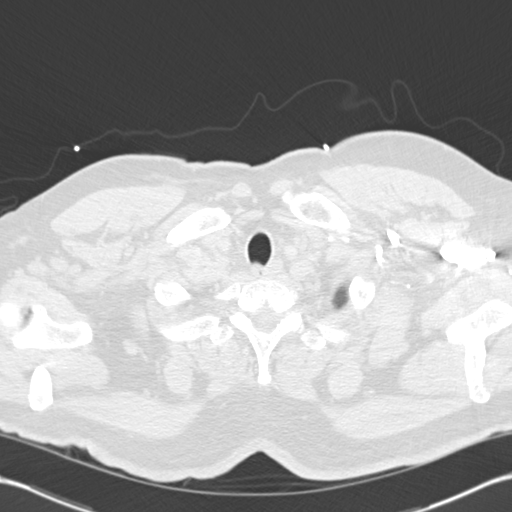

[Series 7: cor mpr 2.0 · coronal · 0.68mm/px · 3 of 153 slices shown]
[im 39/153  soft-tissue]
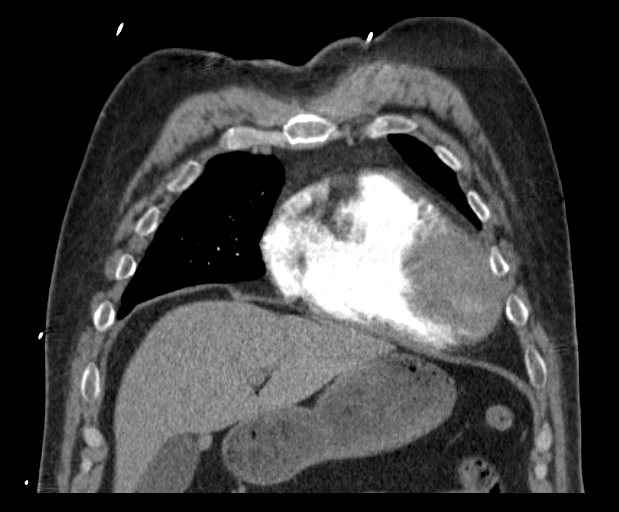
[im 77/153  soft-tissue]
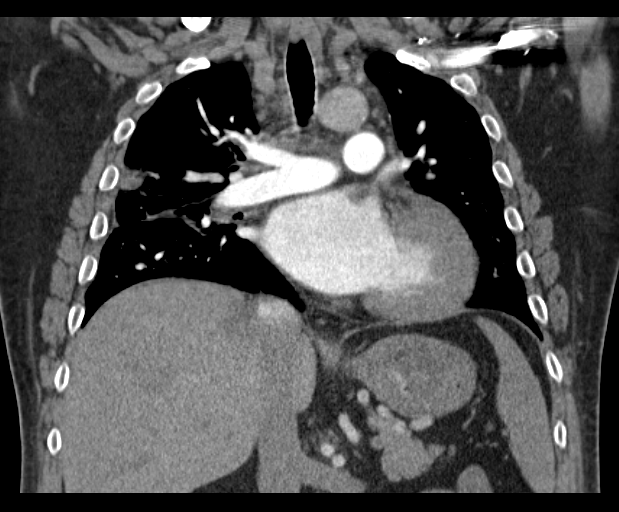
[im 115/153  soft-tissue]
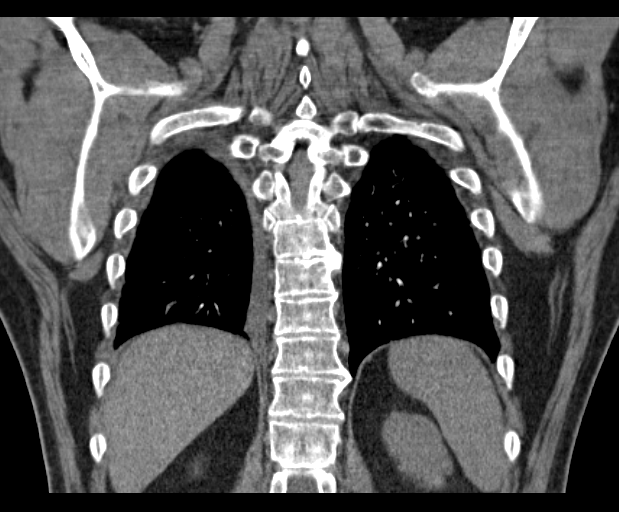

[18 of 46 positions shown; findings below may reference images not displayed]

FINDINGS: There is a focal area of pleural based consolidative change with air
bronchograms in the posterior segment of the right upper lobe most
likely pneumonia. Underlying mass is less likely given sudden onset
a rapid progression but not excluded. Follow-up to resolution is
recommended. There is diffuse interstitial prominence and edema.
There is diffuse mosaic appearance of the lungs with areas of air
trapping likely related to underlying small vessel versus small
airway disease. Small right pleural effusion. There is no
pneumothorax. The central airways are patent.

The thoracic aorta appears unremarkable. No CT evidence of pulmonary
embolism. Top-normal cardiac size. No pericardial effusion. There is
coronary vascular calcification. Right hilar adenopathy. Multiple
top-normal lymph nodes in the prevascular space. Right paratracheal
adenopathy measures 14 mm in short axis. The esophagus is grossly
unremarkable. No thyroid nodules identified.

There is no axillary adenopathy. The chest wall soft tissues appear
unremarkable. There is degenerative changes of the spine. No acute
fracture. A 1 cm sclerotic lesion in the right third rib likely
represents a bone island.

The visualized upper abdomen appear unremarkable.

Review of the MIP images confirms the above findings.
IMPRESSION: No CT evidence of pulmonary embolism.

Focal consolidative changes of the right upper lobe most likely
pneumonia. Clinical correlation and follow-up to resolution
recommended.

Mild interstitial edema and small right pleural effusion.

Right hilar and mediastinal adenopathy as seen on the prior study.
Clinical correlation is recommended.

## 2017-08-08 IMAGING — CR DG CHEST 2V
2 series · 2 of 2 positions shown · non-contrast
Comparison: 01/22/2016

CLINICAL DATA: Sudden onset of left-sided chest pain and dyspnea.

EXAM:
CHEST  2 VIEW

[chest pa]
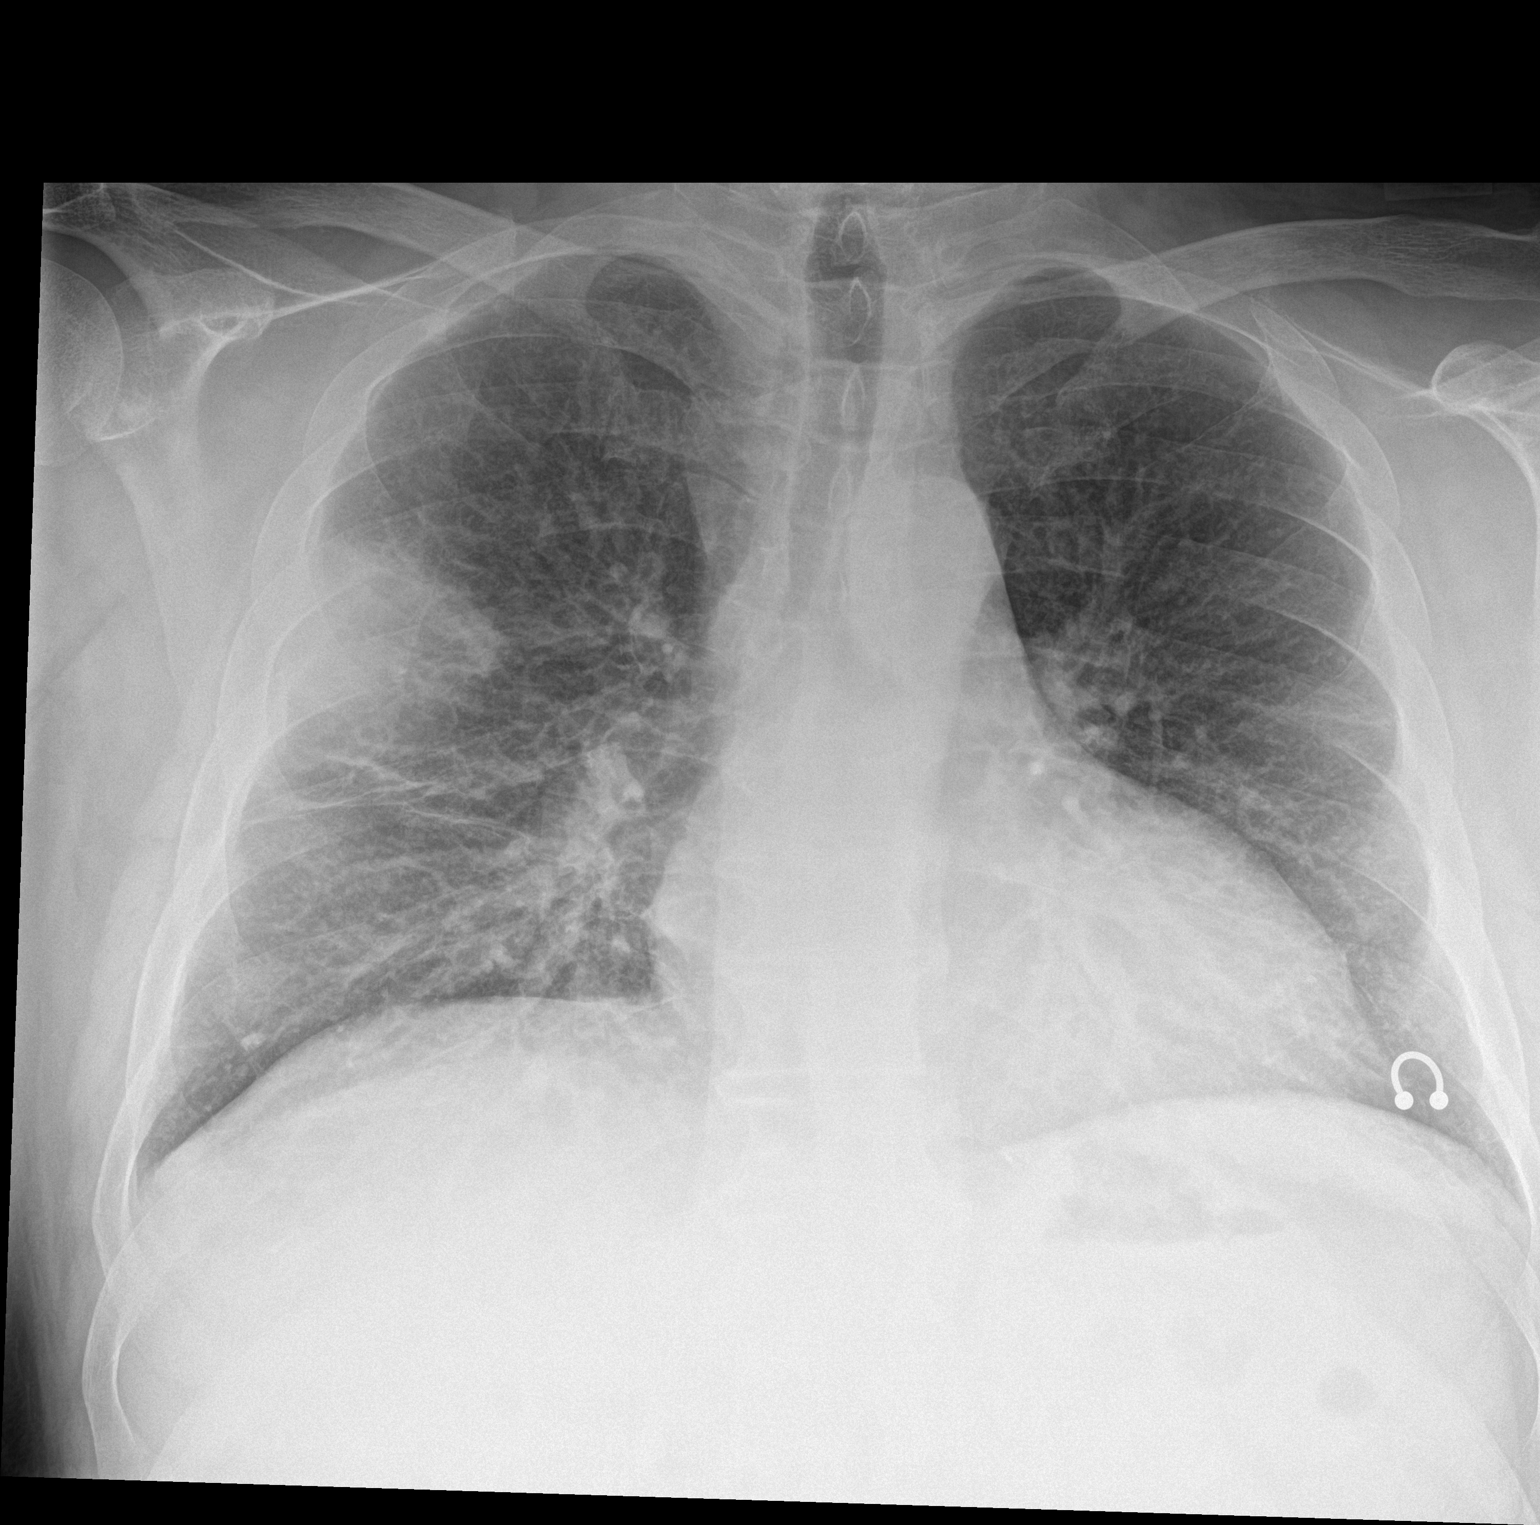

[chest lat]
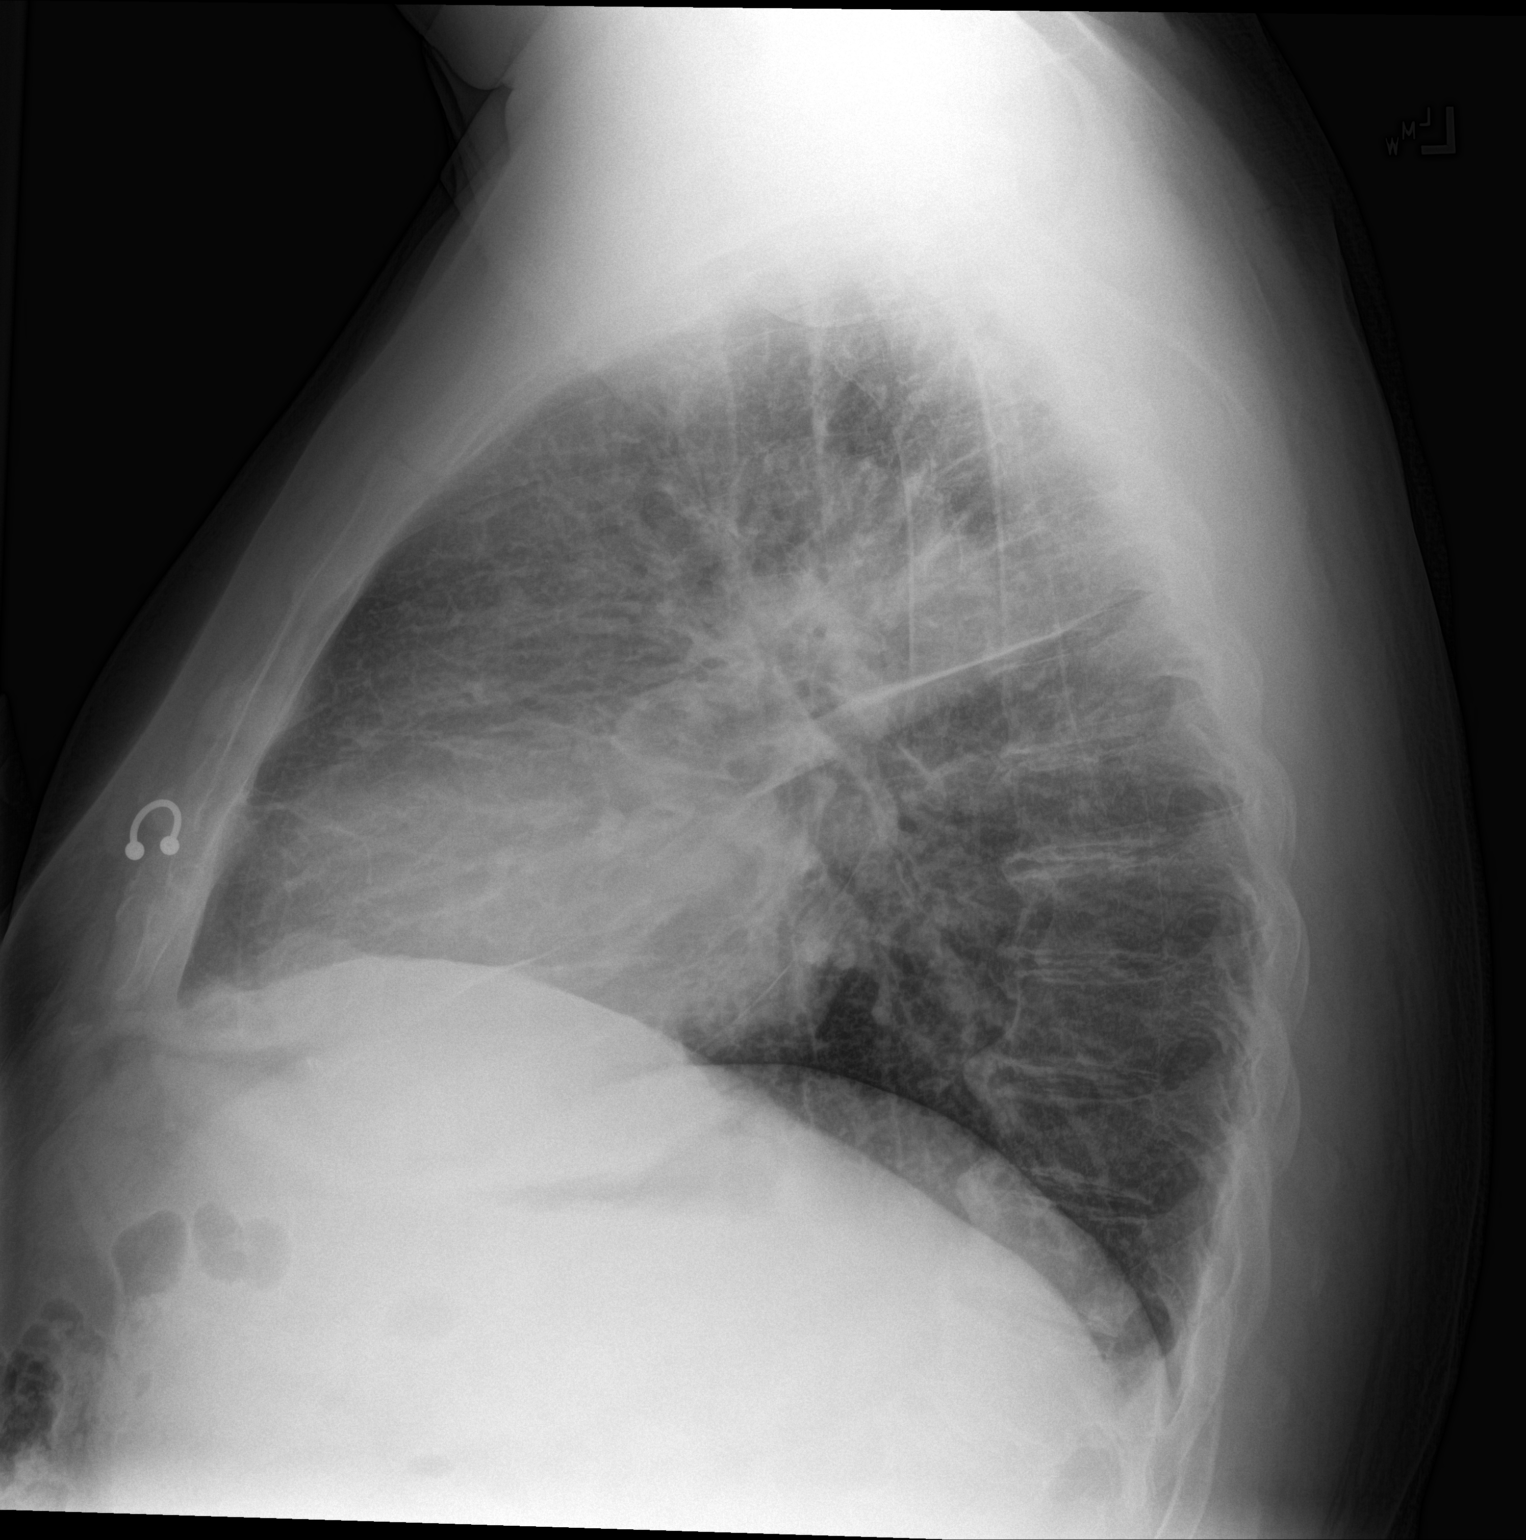

[2 of 2 positions shown; findings below may reference images not displayed]

FINDINGS: Persistent right upper lobe airspace opacity, unchanged from
01/22/2016 although it has improved from 01/04/2016. Left lung is
clear. No pleural effusions. Normal pulmonary vasculature.
Unremarkable hilar and mediastinal contours.
IMPRESSION: Persistent right upper lobe airspace opacity consistent with
pneumonia, improved but not completely resolved. No new
abnormalities. Continued follow-up radiography is recommended to
confirm complete clearance of the right upper lobe abnormality.

## 2017-08-11 IMAGING — US US CAROTID DUPLEX BILAT
1 series · 13 of 24 positions shown · non-contrast
Comparison: None.

CLINICAL DATA: Syncope

EXAM:
BILATERAL CAROTID DUPLEX ULTRASOUND
TECHNIQUE: Gray scale imaging, color Doppler and duplex ultrasound was
performed of bilateral carotid and vertebral arteries in the neck.
TECHNIQUE: Quantification of carotid stenosis is based on velocity parameters
that correlate the residual internal carotid diameter with
NASCET-based stenosis levels, using the diameter of the distal
internal carotid lumen as the denominator for stenosis measurement.

[Series 1: us carotid duplex bilat · 13 of 62 slices shown]
[im 1/62]
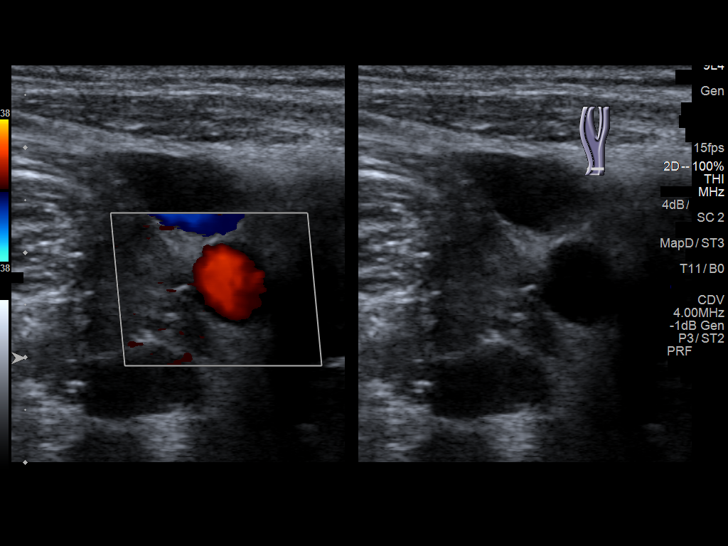
[im 6/62]
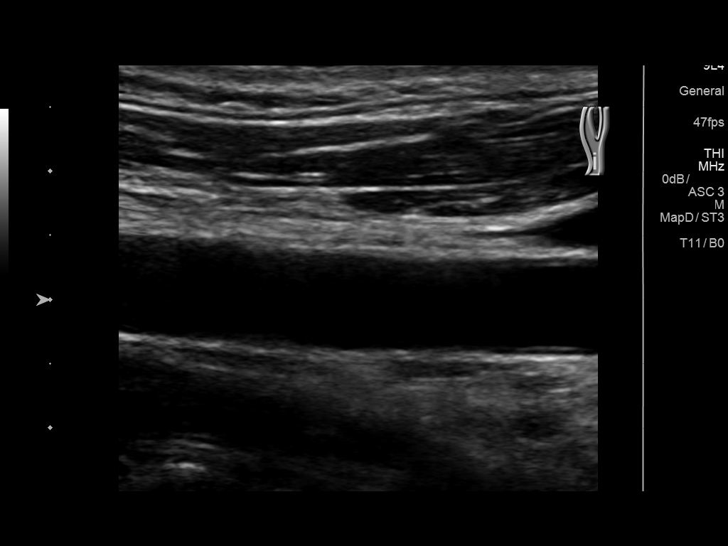
[im 11/62]
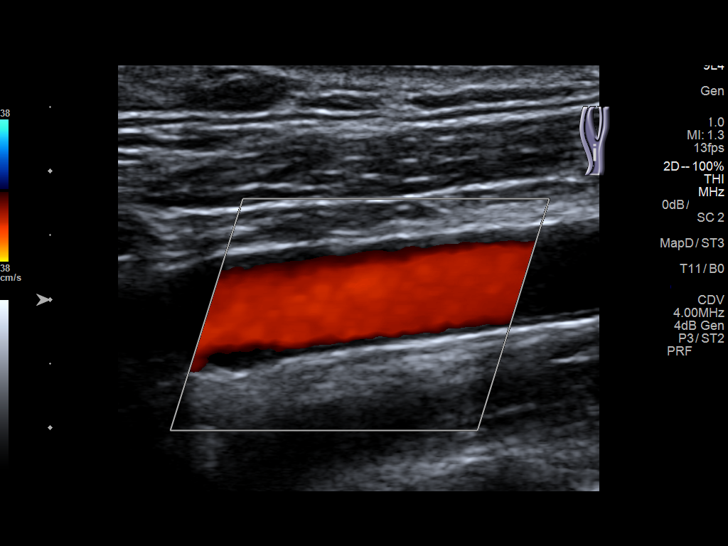
[im 16/62]
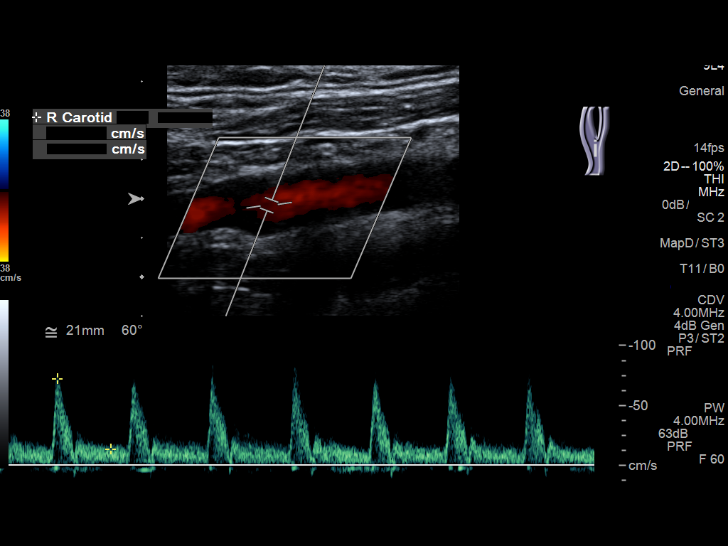
[im 22/62]
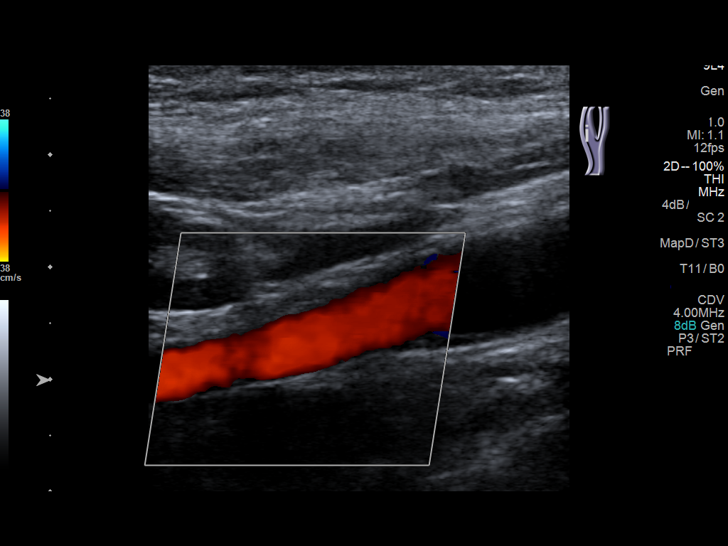
[im 27/62]
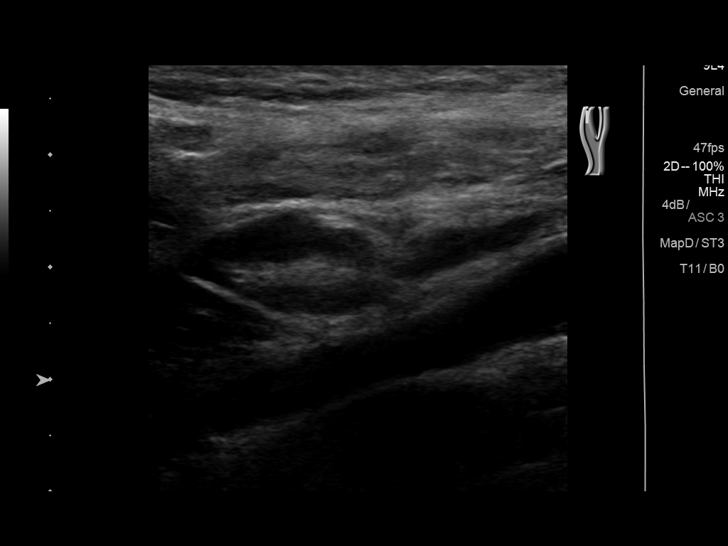
[im 32/62]
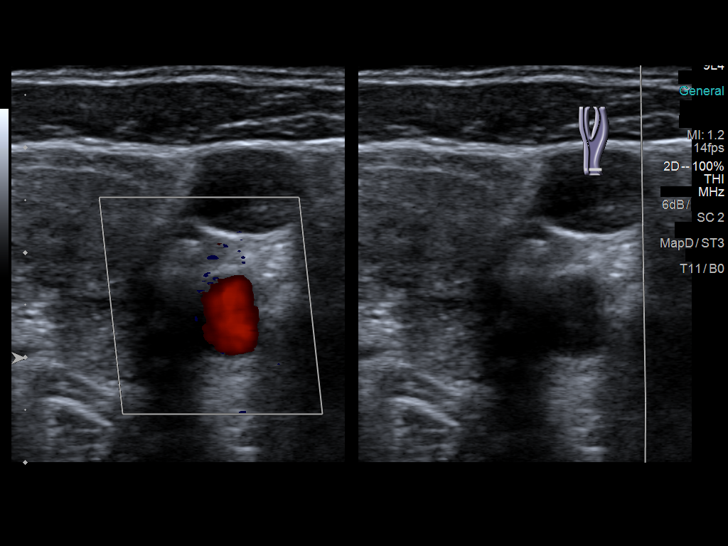
[im 35/62]
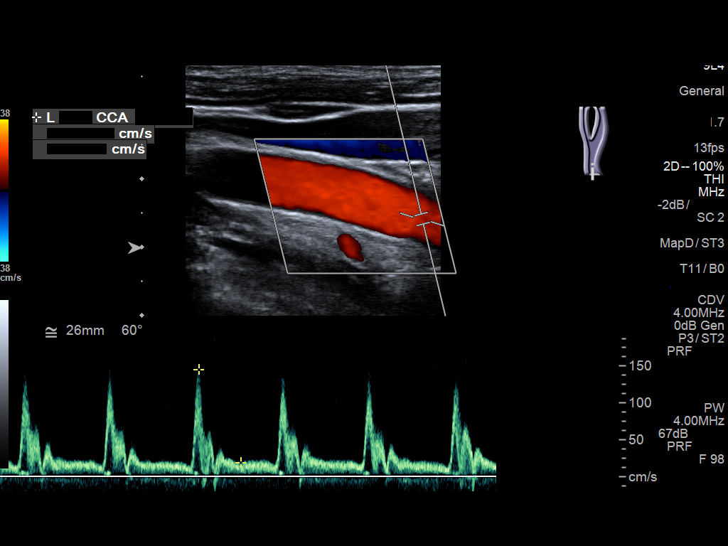
[im 40/62]
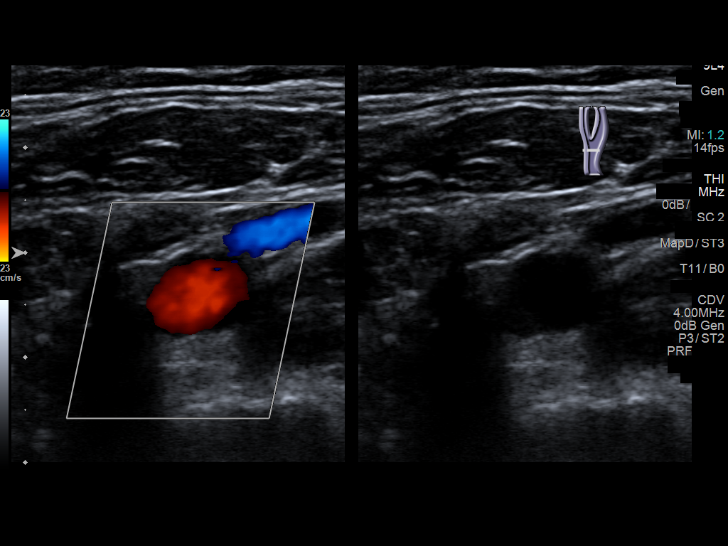
[im 46/62]
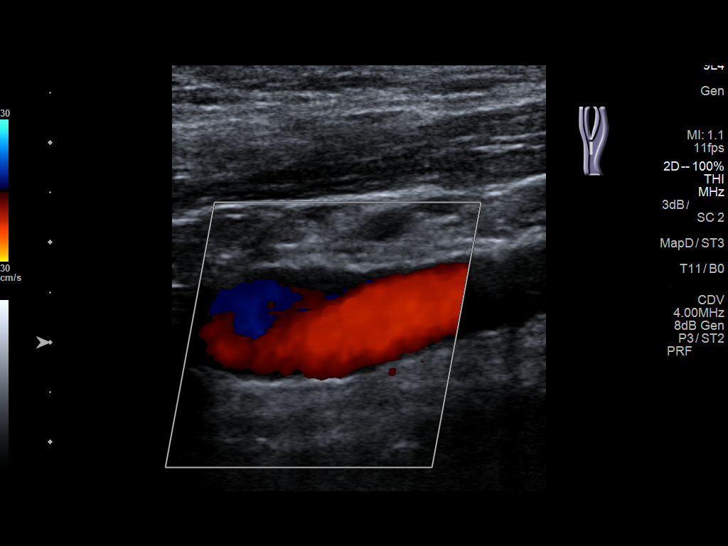
[im 51/62]
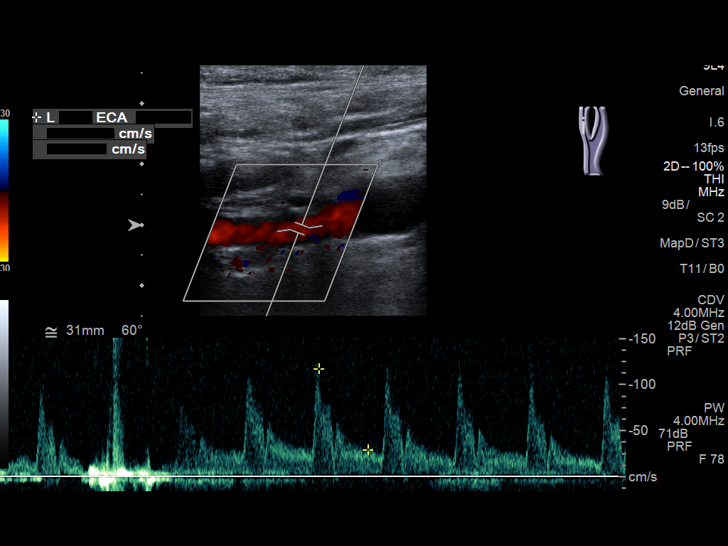
[im 56/62]
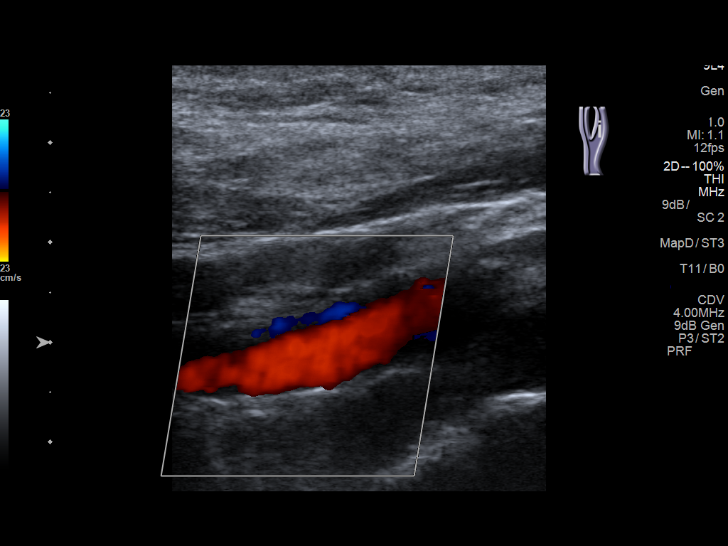
[im 62/62]
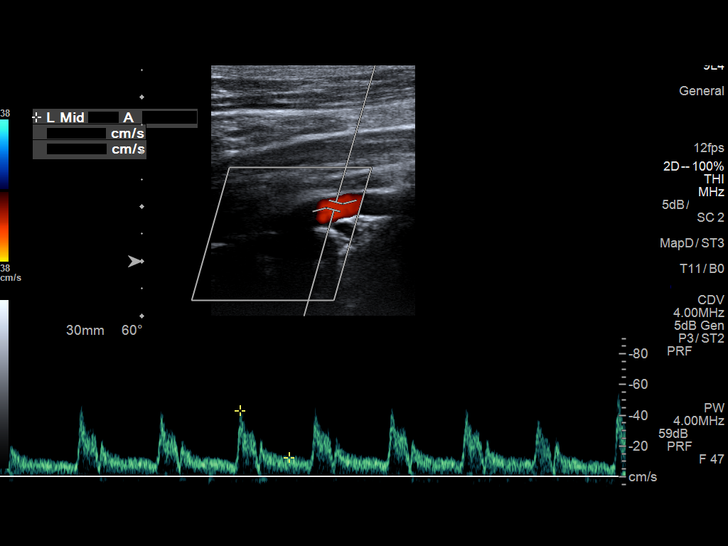

[13 of 24 positions shown; findings below may reference images not displayed]

The following velocity measurements were obtained:

PEAK SYSTOLIC/END DIASTOLIC

RIGHT

ICA:                     59/20cm/sec

CCA:                     85/16cm/sec

SYSTOLIC ICA/CCA RATIO:

DIASTOLIC ICA/CCA RATIO:

ECA:                     129cm/sec

LEFT

ICA:                     52/23cm/sec

CCA:                     110/20cm/sec

SYSTOLIC ICA/CCA RATIO:

DIASTOLIC ICA/CCA RATIO:

ECA:                     117cm/sec
FINDINGS: RIGHT CAROTID ARTERY: No significant plaque accumulation. No
significant stenosis. Normal waveforms and color Doppler signal.

RIGHT VERTEBRAL ARTERY:  Normal flow direction and waveform.

LEFT CAROTID ARTERY: No significant plaque accumulation or stenosis.
Normal waveforms and color Doppler signal.

LEFT VERTEBRAL ARTERY: Normal flow direction and waveform.
IMPRESSION: 1. No significant carotid plaque or stenosis.
2.  Antegrade bilateral vertebral arterial flow.

## 2017-08-21 ENCOUNTER — Emergency Department: Payer: Self-pay

## 2017-08-21 ENCOUNTER — Other Ambulatory Visit: Payer: Self-pay

## 2017-08-21 ENCOUNTER — Encounter: Payer: Self-pay | Admitting: Emergency Medicine

## 2017-08-21 ENCOUNTER — Observation Stay
Admission: EM | Admit: 2017-08-21 | Discharge: 2017-08-24 | Disposition: A | Discharge status: Home / Self Care | Payer: Self-pay | Attending: Internal Medicine | Admitting: Internal Medicine

## 2017-08-21 DIAGNOSIS — I429 Cardiomyopathy, unspecified: Secondary | ICD-10-CM | POA: Insufficient documentation

## 2017-08-21 DIAGNOSIS — E669 Obesity, unspecified: Secondary | ICD-10-CM | POA: Insufficient documentation

## 2017-08-21 DIAGNOSIS — Z6834 Body mass index (BMI) 34.0-34.9, adult: Secondary | ICD-10-CM | POA: Insufficient documentation

## 2017-08-21 DIAGNOSIS — Z23 Encounter for immunization: Secondary | ICD-10-CM | POA: Insufficient documentation

## 2017-08-21 DIAGNOSIS — R7989 Other specified abnormal findings of blood chemistry: Secondary | ICD-10-CM | POA: Insufficient documentation

## 2017-08-21 DIAGNOSIS — Z809 Family history of malignant neoplasm, unspecified: Secondary | ICD-10-CM | POA: Insufficient documentation

## 2017-08-21 DIAGNOSIS — J069 Acute upper respiratory infection, unspecified: Principal | ICD-10-CM | POA: Insufficient documentation

## 2017-08-21 DIAGNOSIS — E1122 Type 2 diabetes mellitus with diabetic chronic kidney disease: Secondary | ICD-10-CM | POA: Insufficient documentation

## 2017-08-21 DIAGNOSIS — Z7984 Long term (current) use of oral hypoglycemic drugs: Secondary | ICD-10-CM | POA: Insufficient documentation

## 2017-08-21 DIAGNOSIS — I2510 Atherosclerotic heart disease of native coronary artery without angina pectoris: Secondary | ICD-10-CM | POA: Insufficient documentation

## 2017-08-21 DIAGNOSIS — J441 Chronic obstructive pulmonary disease with (acute) exacerbation: Secondary | ICD-10-CM | POA: Insufficient documentation

## 2017-08-21 DIAGNOSIS — R42 Dizziness and giddiness: Secondary | ICD-10-CM

## 2017-08-21 DIAGNOSIS — I5042 Chronic combined systolic (congestive) and diastolic (congestive) heart failure: Secondary | ICD-10-CM

## 2017-08-21 DIAGNOSIS — Z79899 Other long term (current) drug therapy: Secondary | ICD-10-CM | POA: Insufficient documentation

## 2017-08-21 DIAGNOSIS — Z888 Allergy status to other drugs, medicaments and biological substances status: Secondary | ICD-10-CM | POA: Insufficient documentation

## 2017-08-21 DIAGNOSIS — G8929 Other chronic pain: Secondary | ICD-10-CM | POA: Diagnosis present

## 2017-08-21 DIAGNOSIS — I130 Hypertensive heart and chronic kidney disease with heart failure and stage 1 through stage 4 chronic kidney disease, or unspecified chronic kidney disease: Secondary | ICD-10-CM | POA: Insufficient documentation

## 2017-08-21 DIAGNOSIS — Z833 Family history of diabetes mellitus: Secondary | ICD-10-CM | POA: Insufficient documentation

## 2017-08-21 DIAGNOSIS — R0789 Other chest pain: Secondary | ICD-10-CM | POA: Diagnosis present

## 2017-08-21 DIAGNOSIS — Z7901 Long term (current) use of anticoagulants: Secondary | ICD-10-CM | POA: Insufficient documentation

## 2017-08-21 DIAGNOSIS — F17210 Nicotine dependence, cigarettes, uncomplicated: Secondary | ICD-10-CM | POA: Insufficient documentation

## 2017-08-21 DIAGNOSIS — R079 Chest pain, unspecified: Secondary | ICD-10-CM

## 2017-08-21 DIAGNOSIS — Z8249 Family history of ischemic heart disease and other diseases of the circulatory system: Secondary | ICD-10-CM | POA: Insufficient documentation

## 2017-08-21 DIAGNOSIS — N183 Chronic kidney disease, stage 3 (moderate): Secondary | ICD-10-CM | POA: Insufficient documentation

## 2017-08-21 LAB — TROPONIN I
Troponin I: 0.1 ng/mL (ref <0.03)
Troponin I: 0.11 ng/mL (ref <0.03)
Troponin I: 0.1 ng/mL (ref <0.03)

## 2017-08-21 LAB — GROUP A STREP BY PCR: Group A Strep by PCR: NOT DETECTED

## 2017-08-21 LAB — BASIC METABOLIC PANEL
Anion gap: 10 (ref 5–15)
BUN: 11 mg/dL (ref 6–20)
CO2: 23 mmol/L (ref 22–32)
Calcium: 9.1 mg/dL (ref 8.9–10.3)
Chloride: 103 mmol/L (ref 101–111)
Creatinine, Ser: 1 mg/dL (ref 0.61–1.24)
GFR calc Af Amer: >60 mL/min (ref >60)
GFR calc non Af Amer: >60 mL/min (ref >60)
Glucose, Bld: 281 mg/dL — ABNORMAL HIGH (ref 65–99)
Potassium: 3.5 mmol/L (ref 3.5–5.1)
Sodium: 136 mmol/L (ref 135–145)

## 2017-08-21 LAB — TSH: TSH: 1.502 u[IU]/mL (ref 0.350–4.500)

## 2017-08-21 LAB — CBC WITH DIFFERENTIAL/PLATELET
Basophils Absolute: 0.1 10*3/uL (ref 0–0.1)
Basophils Relative: 1 %
Eosinophils Absolute: 0.1 10*3/uL (ref 0–0.7)
Eosinophils Relative: 2 %
HCT: 48.9 % (ref 40.0–52.0)
Hemoglobin: 16.5 g/dL (ref 13.0–18.0)
Lymphocytes Relative: 22 %
Lymphs Abs: 1.9 10*3/uL (ref 1.0–3.6)
MCH: 28.5 pg (ref 26.0–34.0)
MCHC: 33.8 g/dL (ref 32.0–36.0)
MCV: 84.2 fL (ref 80.0–100.0)
Monocytes Absolute: 0.5 10*3/uL (ref 0.2–1.0)
Monocytes Relative: 7 %
Neutro Abs: 5.7 10*3/uL (ref 1.4–6.5)
Neutrophils Relative %: 68 %
Platelets: 152 10*3/uL (ref 150–440)
RBC: 5.81 MIL/uL (ref 4.40–5.90)
RDW: 14.7 % — ABNORMAL HIGH (ref 11.5–14.5)
WBC: 8.3 10*3/uL (ref 3.8–10.6)

## 2017-08-21 LAB — HEMOGLOBIN A1C
Hgb A1c MFr Bld: 9.2 % — ABNORMAL HIGH (ref 4.8–5.6)
Mean Plasma Glucose: 217.34 mg/dL

## 2017-08-21 LAB — GLUCOSE, CAPILLARY
Glucose-Capillary: 214 mg/dL — ABNORMAL HIGH (ref 65–99)
Glucose-Capillary: 346 mg/dL — ABNORMAL HIGH (ref 65–99)
Glucose-Capillary: 264 mg/dL — ABNORMAL HIGH (ref 65–99)

## 2017-08-21 LAB — INFLUENZA PANEL BY PCR (TYPE A & B)
Influenza A By PCR: NEGATIVE
Influenza B By PCR: NEGATIVE

## 2017-08-21 MED ORDER — IPRATROPIUM-ALBUTEROL 0.5-2.5 (3) MG/3ML IN SOLN: 3.0000 mL | Freq: Four times a day (QID) | RESPIRATORY_TRACT | Status: DC | PRN

## 2017-08-21 MED ORDER — INFLUENZA VAC SPLIT QUAD 0.5 ML IM SUSY
0.5000 mL | PREFILLED_SYRINGE | INTRAMUSCULAR | Status: AC
  Administered 2017-08-22: 0.5 mL via INTRAMUSCULAR
  Filled 2017-08-21: qty 0.5

## 2017-08-21 MED ORDER — AMIODARONE HCL 200 MG PO TABS
200.0000 mg | ORAL_TABLET | Freq: Every day | ORAL | Status: DC
  Administered 2017-08-21 – 2017-08-24 (×4): 200 mg via ORAL
  Filled 2017-08-21 (×3): qty 1

## 2017-08-21 MED ORDER — POLYETHYLENE GLYCOL 3350 17 G PO PACK: 17.0000 g | PACK | Freq: Every day | ORAL | Status: DC | PRN

## 2017-08-21 MED ORDER — SACUBITRIL-VALSARTAN 49-51 MG PO TABS
1.0000 | ORAL_TABLET | Freq: Two times a day (BID) | ORAL | Status: DC
  Administered 2017-08-21 – 2017-08-23 (×6): 1 via ORAL
  Filled 2017-08-21 (×9): qty 1

## 2017-08-21 MED ORDER — INSULIN ASPART 100 UNIT/ML ~~LOC~~ SOLN
0.0000 [IU] | Freq: Three times a day (TID) | SUBCUTANEOUS | Status: DC
  Administered 2017-08-21: 7 [IU] via SUBCUTANEOUS
  Administered 2017-08-22: 3 [IU] via SUBCUTANEOUS
  Administered 2017-08-22: 5 [IU] via SUBCUTANEOUS
  Administered 2017-08-22: 7 [IU] via SUBCUTANEOUS
  Administered 2017-08-23 – 2017-08-24 (×4): 3 [IU] via SUBCUTANEOUS
  Filled 2017-08-21 (×8): qty 1

## 2017-08-21 MED ORDER — MORPHINE SULFATE (PF) 2 MG/ML IV SOLN: 2.0000 mg | Freq: Once | INTRAVENOUS | Status: DC

## 2017-08-21 MED ORDER — METOPROLOL SUCCINATE ER 50 MG PO TB24
50.0000 mg | ORAL_TABLET | Freq: Every day | ORAL | Status: DC
  Administered 2017-08-21 – 2017-08-24 (×4): 50 mg via ORAL
  Filled 2017-08-21 (×4): qty 1

## 2017-08-21 MED ORDER — HYDROCODONE-ACETAMINOPHEN 5-325 MG PO TABS
1.0000 | ORAL_TABLET | ORAL | Status: DC | PRN
  Administered 2017-08-21 – 2017-08-23 (×5): 2 via ORAL
  Filled 2017-08-21 (×5): qty 2

## 2017-08-21 MED ORDER — ONDANSETRON HCL 4 MG PO TABS: 4.0000 mg | ORAL_TABLET | Freq: Four times a day (QID) | ORAL | Status: DC | PRN

## 2017-08-21 MED ORDER — SPIRONOLACTONE 25 MG PO TABS
25.0000 mg | ORAL_TABLET | Freq: Every day | ORAL | Status: DC
  Administered 2017-08-21 – 2017-08-24 (×4): 25 mg via ORAL
  Filled 2017-08-21 (×3): qty 1

## 2017-08-21 MED ORDER — KETOROLAC TROMETHAMINE 30 MG/ML IJ SOLN
15.0000 mg | Freq: Once | INTRAMUSCULAR | Status: AC
  Administered 2017-08-21: 15 mg via INTRAVENOUS
  Filled 2017-08-21: qty 1

## 2017-08-21 MED ORDER — NICOTINE 21 MG/24HR TD PT24
21.0000 mg | MEDICATED_PATCH | Freq: Every day | TRANSDERMAL | Status: DC
Start: 2017-08-21 — End: 2017-08-24
  Filled 2017-08-21 (×4): qty 1

## 2017-08-21 MED ORDER — ONDANSETRON HCL 4 MG/2ML IJ SOLN: 4.0000 mg | Freq: Four times a day (QID) | INTRAMUSCULAR | Status: DC | PRN

## 2017-08-21 MED ORDER — AMLODIPINE BESYLATE 5 MG PO TABS
5.0000 mg | ORAL_TABLET | Freq: Every day | ORAL | Status: DC
  Administered 2017-08-21: 5 mg via ORAL
  Filled 2017-08-21: qty 1

## 2017-08-21 MED ORDER — ACETAMINOPHEN 325 MG PO TABS
650.0000 mg | ORAL_TABLET | Freq: Four times a day (QID) | ORAL | Status: DC | PRN
  Administered 2017-08-22: 650 mg via ORAL
  Filled 2017-08-21 (×2): qty 2

## 2017-08-21 MED ORDER — AZITHROMYCIN 250 MG PO TABS
500.0000 mg | ORAL_TABLET | Freq: Every day | ORAL | Status: DC
  Administered 2017-08-21 – 2017-08-24 (×4): 500 mg via ORAL
  Filled 2017-08-21 (×3): qty 2

## 2017-08-21 MED ORDER — ACETAMINOPHEN 500 MG PO TABS
1000.0000 mg | ORAL_TABLET | ORAL | Status: AC
  Administered 2017-08-21: 1000 mg via ORAL
  Filled 2017-08-21: qty 2

## 2017-08-21 MED ORDER — ENOXAPARIN SODIUM 40 MG/0.4ML ~~LOC~~ SOLN
40.0000 mg | SUBCUTANEOUS | Status: DC
  Administered 2017-08-21 – 2017-08-23 (×3): 40 mg via SUBCUTANEOUS
  Filled 2017-08-21 (×3): qty 0.4

## 2017-08-21 MED ORDER — ASPIRIN 81 MG PO CHEW
81.0000 mg | CHEWABLE_TABLET | Freq: Every day | ORAL | Status: DC
  Administered 2017-08-21 – 2017-08-24 (×4): 81 mg via ORAL
  Filled 2017-08-21 (×4): qty 1

## 2017-08-21 MED ORDER — CYCLOBENZAPRINE HCL 5 MG PO TABS
7.5000 mg | ORAL_TABLET | Freq: Three times a day (TID) | ORAL | Status: AC
  Administered 2017-08-21 – 2017-08-23 (×6): 7.5 mg via ORAL
  Filled 2017-08-21 (×8): qty 1.5

## 2017-08-21 MED ORDER — IPRATROPIUM-ALBUTEROL 0.5-2.5 (3) MG/3ML IN SOLN
3.0000 mL | Freq: Once | RESPIRATORY_TRACT | Status: AC
  Administered 2017-08-21: 3 mL via RESPIRATORY_TRACT
  Filled 2017-08-21: qty 3

## 2017-08-21 MED ORDER — ROSUVASTATIN CALCIUM 10 MG PO TABS
20.0000 mg | ORAL_TABLET | Freq: Every day | ORAL | Status: DC
  Administered 2017-08-21 – 2017-08-24 (×4): 20 mg via ORAL
  Filled 2017-08-21: qty 4
  Filled 2017-08-21 (×2): qty 2

## 2017-08-21 MED ORDER — INSULIN ASPART 100 UNIT/ML ~~LOC~~ SOLN
0.0000 [IU] | Freq: Every day | SUBCUTANEOUS | Status: DC
  Administered 2017-08-21: 3 [IU] via SUBCUTANEOUS
  Administered 2017-08-22 – 2017-08-23 (×2): 2 [IU] via SUBCUTANEOUS
  Filled 2017-08-21 (×3): qty 1

## 2017-08-21 MED ORDER — DOCUSATE SODIUM 100 MG PO CAPS
100.0000 mg | ORAL_CAPSULE | Freq: Two times a day (BID) | ORAL | Status: DC
  Administered 2017-08-21 – 2017-08-24 (×6): 100 mg via ORAL
  Filled 2017-08-21 (×5): qty 1

## 2017-08-21 MED ORDER — FUROSEMIDE 10 MG/ML IJ SOLN
40.0000 mg | Freq: Every day | INTRAMUSCULAR | Status: DC
  Administered 2017-08-21 – 2017-08-22 (×2): 40 mg via INTRAVENOUS
  Filled 2017-08-21 (×2): qty 4

## 2017-08-21 MED ORDER — ACETAMINOPHEN 650 MG RE SUPP: 650.0000 mg | Freq: Four times a day (QID) | RECTAL | Status: DC | PRN

## 2017-08-21 MED ORDER — HYDROCOD POLST-CPM POLST ER 10-8 MG/5ML PO SUER
5.0000 mL | Freq: Two times a day (BID) | ORAL | Status: DC
  Administered 2017-08-21 – 2017-08-24 (×7): 5 mL via ORAL
  Filled 2017-08-21 (×6): qty 5

## 2017-08-21 MED ORDER — METFORMIN HCL 500 MG PO TABS
500.0000 mg | ORAL_TABLET | Freq: Two times a day (BID) | ORAL | Status: DC
  Administered 2017-08-21 – 2017-08-23 (×4): 500 mg via ORAL
  Filled 2017-08-21 (×4): qty 1

## 2017-08-21 MED ORDER — MENTHOL 3 MG MT LOZG
1.0000 | LOZENGE | OROMUCOSAL | Status: DC | PRN
  Administered 2017-08-22: 3 mg via ORAL
  Filled 2017-08-21: qty 9

## 2017-08-21 NOTE — ED Notes (Signed)
Elevated Trop. Reported verbally to Dr Jacqualine Code 0.10, acknowledged with no new orders.

## 2017-08-21 NOTE — H&P (Addendum)
[facility] NAME: Gabriel [name]    MR#:  [id number]  DATE OF BIRTH:  1970-11-23  DATE OF ADMISSION:  08/21/2017  PRIMARY CARE PHYSICIAN: [place]   REQUESTING/REFERRING PHYSICIAN: Dr. Delman Kitten  CHIEF COMPLAINT:   Chief Complaint  Patient presents with  . Chest Pain    HISTORY OF PRESENT ILLNESS:  Gabriel [name]  is a 47 y.o. male with a known history of chronic chest pain, COPD, active smoker, CKD, hypertension, nonischemic cardiomyopathy with the systolic CHF, psoriasis comes from home secondary to worsening cough, chest pain and shortness of breath going on for a week now. Patient admitted with chest pain 6 months ago and was noted to have nonobstructive coronary artery disease on cardiac catheterization. He had non-ischemic cardiomyopathy with EF of 30%, started on cardiac medications. He states he has been around people were sick with strep throat. Symptoms started about a week ago with on and off chest pain that has been consistent and worse since last night. Cough has been very dry but has significant muscle aches from coughing. Also complains of sore throat. Denies any fevers but complains of chills. No nausea or vomiting. He is not noted to be hypoxic or tachycardic today, however troponin is slightly elevated at 0.1 which is high from his baseline. Chest x-ray with no acute changes.  PAST MEDICAL HISTORY:   Past Medical History:  Diagnosis Date  . Chest wall pain, chronic   . Chronic systolic CHF (congestive heart failure) ([place])    a. 03/2015 Echo: EF 45-50%; b. 12/2015 Echo: EF 20-25%; c. 02/2016 Echo: EF 30-35%; d. 11/2016 Echo: EF 40-45%.  . Chronic Troponin Elevation   . CKD (chronic kidney disease), stage III ([place])   . COPD (chronic obstructive pulmonary disease) ([place])   . Hypertension    Resolved since weight loss  . NICM (nonischemic cardiomyopathy) ([place])    a. 03/2015 Echo: EF 45-50%; b.  04/2015 MV: small defect of mild severity in apex 2/2 apical thinning, EF 30-44%;  c. 12/2015 Echo: EF 20-25%; d. 02/2016 Echo: EF 30-35%; e. 11/2016 Echo: EF 40-45%, diff HK, mildly dil Ao root, mild MR, mod dil LA;  f. 12/2016 Cath: LM nl, LAD/Diags/LCX/OMs min irregs, RCA 40p/m/d.  . Non-obstructive CAD (coronary artery disease)    a. 04/2015 low risk MV;  b. 12/2016 Cath: minor irregs in LAD/Diag/LCX/OM, RCA 40p/m/d.  [name] (nonsustained ventricular tachycardia) ([place])    a. 12/2015 noted on tele-->amio;  b. 12/2015 Event monitor: no VT. Atach noted.  . Obesity (BMI 30.0-34.9)   . Psoriasis   . Syncope    a. 01/2016 - felt to be vasovagal.  . Tobacco abuse     PAST SURGICAL HISTORY:   Past Surgical History:  Procedure Laterality Date  . AMPUTATION    . FINGER AMPUTATION     Traumatic  . LEFT HEART CATH AND CORONARY ANGIOGRAPHY N/A 01/06/2017   Procedure: Left Heart Cath and Coronary Angiography;  Surgeon: Wellington Hampshire, MD;  Location: [facility];  Service: Cardiovascular;  Laterality: N/A;    SOCIAL HISTORY:   Social History   Tobacco Use  . Smoking status: Current Some Day Smoker    Packs/day: 0.50    Years: 33.00    Pack years: 16.50    Types: Cigarettes  . Smokeless tobacco: Never Used  . Tobacco comment: currently smoking a few cigs/day. But was smoking 2-3 packs per  day up until last year for almost 30 years  Substance Use Topics  . Alcohol use: No    Alcohol/week: 1.8 oz    Types: 3 Cans of beer per week    Frequency: Never    Comment: occassionally    FAMILY HISTORY:   Family History  Problem Relation Age of Onset  . Diabetes Mellitus II Mother   . Hypertension Father   . Cancer Paternal Aunt   . Cancer Maternal Grandfather     DRUG ALLERGIES:   Allergies  Allergen Reactions  . Prednisone Other (See Comments)    Reaction: Hallucinations     REVIEW OF SYSTEMS:   Review of Systems  Constitutional: Positive for malaise/fatigue. Negative for  chills, fever and weight loss.  HENT: Negative for ear discharge, ear pain, hearing loss, nosebleeds and tinnitus.   Eyes: Negative for blurred vision, double vision and photophobia.  Respiratory: Positive for cough and shortness of breath. Negative for hemoptysis and wheezing.   Cardiovascular: Positive for chest pain. Negative for palpitations, orthopnea and leg swelling.  Gastrointestinal: Positive for nausea. Negative for abdominal pain, constipation, diarrhea, heartburn, melena and vomiting.  Genitourinary: Negative for dysuria, frequency and urgency.  Musculoskeletal: Positive for myalgias. Negative for back pain and neck pain.  Skin: Negative for rash.  Neurological: Negative for dizziness, tremors, sensory change, speech change, focal weakness and headaches.  Endo/Heme/Allergies: Does not bruise/bleed easily.  Psychiatric/Behavioral: Negative for depression.    MEDICATIONS AT HOME:   Prior to Admission medications   Medication Sig Start Date End Date Taking? Authorizing Provider  amiodarone (PACERONE) 200 MG tablet Take 200 mg by mouth daily.   Yes [provider]  amLODipine (NORVASC) 5 MG tablet Take 1 tablet (5 mg total) daily by mouth. 06/30/17 06/30/18 Yes Eula Listen, MD  aspirin 81 MG chewable tablet Chew 1 tablet (81 mg total) by mouth daily. Reported on 02/08/2016 06/02/17  Yes [name] A, FNP  dextromethorphan (DELSYM) 30 MG/5ML liquid Take 15 mg by mouth as needed for cough.   Yes [provider]  metFORMIN (GLUCOPHAGE) 500 MG tablet Take 1 tablet by mouth 2 (two) times daily.   Yes [provider]  metoprolol succinate (TOPROL-XL) 50 MG 24 hr tablet Take 1 tablet (50 mg total) daily by mouth. Take with or immediately following a meal. Patient taking differently: Take 50 mg by mouth daily.  06/27/17 09/25/17 Yes Gollan, Kathlene November, MD  potassium chloride (K-DUR) 10 MEQ tablet Take 1 tablet (10 mEq total) daily by mouth. 07/07/17  Yes  Gollan, Kathlene November, MD  ranitidine (ZANTAC) 150 MG tablet Take 1 tablet (150 mg total) by mouth 2 (two) times daily. 06/04/17  Yes Hackney, Otila Kluver A, FNP  rosuvastatin (CRESTOR) 20 MG tablet Take 1 tablet (20 mg total) by mouth daily. 06/02/17 08/31/17 Yes Hackney, Tina A, FNP  sacubitril-valsartan (ENTRESTO) 49-51 MG Take 1 tablet 2 (two) times daily by mouth. 06/27/17  Yes Minna Merritts, MD  spironolactone (ALDACTONE) 25 MG tablet Take 1 tablet (25 mg total) by mouth daily. 06/02/17  Yes Hackney, Otila Kluver A, FNP  sucralfate (CARAFATE) 1 g tablet Take 1 tablet (1 g total) by mouth 4 (four) times daily. 06/02/17  Yes Hackney, Otila Kluver A, FNP  torsemide (DEMADEX) 20 MG tablet Take 1 tablet (20 mg total) by mouth daily. 06/02/17  Yes Hackney, Tina A, FNP      VITAL SIGNS:  Blood pressure (!) 167/105, pulse 76, temperature 98.4 F (36.9 C), temperature source  Oral, resp. rate 20, weight 117.9 kg (260 lb), SpO2 91 %.  PHYSICAL EXAMINATION:   Physical Exam  GENERAL:  47 y.o.-year-old patient lying in the bed with no acute distress.  EYES: Pupils equal, round, reactive to light and accommodation. No scleral icterus. Extraocular muscles intact.  HEENT: Head atraumatic, normocephalic. Oropharynx and nasopharynx clear. Mildly erythematous posterior pharyngeal wall NECK:  Supple, no jugular venous distention. No thyroid enlargement, no tenderness.  LUNGS: Normal breath sounds bilaterally, no wheezing, rales,rhonchi or crepitation. No use of accessory muscles of respiration.  CARDIOVASCULAR: S1, S2 normal. No murmurs, rubs, or gallops.  ABDOMEN: Soft, obese, nontender, nondistended. Bowel sounds present. No organomegaly or mass.  EXTREMITIES: No pedal edema, cyanosis, or clubbing.  NEUROLOGIC: Cranial nerves II through XII are intact. Muscle strength 5/5 in all extremities. Sensation intact. Gait not checked.  PSYCHIATRIC: The patient is alert and oriented x 3.  SKIN: No obvious rash, lesion, or ulcer.    LABORATORY PANEL:   CBC Recent Labs  Lab 08/21/17 0925  WBC 8.3  HGB 16.5  HCT 48.9  PLT 152   ------------------------------------------------------------------------------------------------------------------  Chemistries  Recent Labs  Lab 08/21/17 0925  NA 136  K 3.5  CL 103  CO2 23  GLUCOSE 281*  BUN 11  CREATININE 1.00  CALCIUM 9.1   ------------------------------------------------------------------------------------------------------------------  Cardiac Enzymes Recent Labs  Lab 08/21/17 0925  TROPONINI 0.10*   ------------------------------------------------------------------------------------------------------------------  RADIOLOGY:  Dg Chest 2 View  Result Date: 08/21/2017 CLINICAL DATA:  Chest pain. EXAM: CHEST  2 VIEW COMPARISON:  Radiographs of June 30, 2017. FINDINGS: The heart size and mediastinal contours are within normal limits. Both lungs are clear. No pneumothorax or pleural effusion is noted. The visualized skeletal structures are unremarkable. IMPRESSION: No active cardiopulmonary disease. Electronically Signed   By: Marijo Conception, M.D.   On: 08/21/2017 09:53    EKG:   Orders placed or performed during the hospital encounter of 08/21/17  . EKG 12-Lead  . EKG 12-Lead    IMPRESSION AND PLAN:   Dejay Kronk  is a 47 y.o. male with a known history of chronic chest pain, COPD, active smoker, CKD, hypertension, nonischemic cardiomyopathy with the systolic CHF, psoriasis comes from home secondary to worsening cough, chest pain and shortness of breath going on for a week now.  1. Chest pain-likely musculoskeletal pain or from bronchitis -Treat his cough, muscle relaxants for chest pain -Patient's troponin is elevated, recycle troponins -Just had a cardiac catheterization done in May 2018 with nonobstructive coronary artery disease. Monitor closely -Started on Z-Pak for his bronchitis symptoms  2. Elevated troponin-could be demand  ischemia as mentioned above. Monitor on telemetry and recycle troponins.  3. Nonischemic cardiomyopathy-known systolic and diastolic dysfunction. EF of 35% -Continue cardiac medications. Patient on entresto, Aldactone, metoprolol and statin -Changes torsemide to Lasix today and likely will be able to go back to oral torsemide tomorrow. No significant fluid overload noted.  4. Diabetes mellitus-on metformin. Start sliding scale insulin.  5. DVT prophylaxis-on Lovenox  6. Tobacco use disorder-started on nicotine patch    All the records are reviewed and case discussed with ED provider. Management plans discussed with the patient, family and they are in agreement.  CODE STATUS: Full code  TOTAL TIME TAKING CARE OF THIS PATIENT: 50 minutes.    [place] Lighter M.D on 08/21/2017 at 12:18 PM  Between 7am to 6pm - Pager - [phone]  After 6pm go to [email or web address] - password [email or web address]  [employer]  Office  [phone]  CC: Primary care physician; Center, [facility]

## 2017-08-21 NOTE — ED Provider Notes (Signed)
[facility] Emergency Department Provider Note   ____________________________________________   First MD Initiated Contact with Patient 08/21/17 [phone]     (approximate)  I have reviewed the triage vital signs and the nursing notes.   HISTORY  Chief Complaint Chest Pain    HPI Gabriel [name]. is a 47 y.o. male past medical history of congestive heart failure, chronic kidney disease, COPD, hypertension  Patient reports that for about 1 week now he is been experiencing cough, congestion, occasionally feeling hot and sweaty, generalized achiness and chills.  For about the last 2-3 days has been noticing discomfort over his left lower chest wall, reports that feels like it has in the past when he had "pneumonia".  Denies any sharp or pleuritic pain, but reports a feeling of discomfort hard to describe over the left chest wall.  It is nonradiating.  It is been present for about 2-3 days now.  Reports last night he felt like he was having the sweats and chills, feels like he is just generally getting worse.  No nausea vomiting.  No abdominal pain.  No headaches or stiff neck.  No rash.  He has not seen any swelling in his lower legs.  Symptoms are worsened when his nose is running and congested make it harder to sleep.  Also having a sore throat at times for the last few days, discomfort with swallowing but no difficulty in swallowing  His girlfriend has 2 small children 1 of whom had a viral illness, ear infection and the other who had strep recently he feels like he may have picked up the viral illness  Past Medical History:  Diagnosis Date  . Chest wall pain, chronic   . Chronic systolic CHF (congestive heart failure) ([place])    a. 03/2015 Echo: EF 45-50%; b. 12/2015 Echo: EF 20-25%; c. 02/2016 Echo: EF 30-35%; d. 11/2016 Echo: EF 40-45%.  . Chronic Troponin Elevation   . CKD (chronic kidney disease), stage III ([place])   . COPD (chronic obstructive pulmonary  disease) ([place])   . Hypertension    Resolved since weight loss  . NICM (nonischemic cardiomyopathy) ([place])    a. 03/2015 Echo: EF 45-50%; b. 04/2015 MV: small defect of mild severity in apex 2/2 apical thinning, EF 30-44%;  c. 12/2015 Echo: EF 20-25%; d. 02/2016 Echo: EF 30-35%; e. 11/2016 Echo: EF 40-45%, diff HK, mildly dil Ao root, mild MR, mod dil LA;  f. 12/2016 Cath: LM nl, LAD/Diags/LCX/OMs min irregs, RCA 40p/m/d.  . Non-obstructive CAD (coronary artery disease)    a. 04/2015 low risk MV;  b. 12/2016 Cath: minor irregs in LAD/Diag/LCX/OM, RCA 40p/m/d.  [name] (nonsustained ventricular tachycardia) ([place])    a. 12/2015 noted on tele-->amio;  b. 12/2015 Event monitor: no VT. Atach noted.  . Obesity (BMI 30.0-34.9)   . Psoriasis   . Syncope    a. 01/2016 - felt to be vasovagal.  . Tobacco abuse     Patient Active Problem List   Diagnosis Date Noted  . Chest pain 08/21/2017  . Noncompliance with medications 04/29/2017  . Back pain 03/06/2017  . Tobacco use 12/04/2016  . Gallbladder sludge 12/04/2016  . HCAP (healthcare-associated pneumonia) 03/15/2016  . Coronary artery disease, non-occlusive 03/15/2016  . Atypical chest pain 02/16/2016  . Orthostatic hypotension 01/23/2016  . Diarrhea 01/23/2016  . Hypokalemia 01/23/2016  . Hypomagnesemia 01/23/2016  . Congestive dilated cardiomyopathy ([place]) 01/17/2016  . NSVT (nonsustained ventricular tachycardia) ([place])   . 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 CAP (community acquired pneumonia)   . Arterial hypotension   . Elevated troponin   . Chronic systolic heart failure ([place])   . Sepsis ([place]) 01/03/2016  . Diabetes ([place]) 05/17/2015  . Hyperlipidemia 05/17/2015  . COPD (chronic obstructive pulmonary disease) ([place]) 05/17/2015  . HTN (hypertension) 03/31/2015    Past Surgical History:  Procedure Laterality Date  . AMPUTATION    . FINGER AMPUTATION     Traumatic  . LEFT HEART CATH AND CORONARY ANGIOGRAPHY N/A 01/06/2017   Procedure:  Left Heart Cath and Coronary Angiography;  Surgeon: Wellington Hampshire, MD;  Location: [facility];  Service: Cardiovascular;  Laterality: N/A;    Prior to Admission medications   Medication Sig Start Date End Date Taking? Authorizing Provider  amiodarone (PACERONE) 200 MG tablet Take 200 mg by mouth daily.   Yes [provider]  amLODipine (NORVASC) 5 MG tablet Take 1 tablet (5 mg total) daily by mouth. 06/30/17 06/30/18 Yes Eula Listen, MD  aspirin 81 MG chewable tablet Chew 1 tablet (81 mg total) by mouth daily. Reported on 02/08/2016 06/02/17  Yes [name] A, FNP  dextromethorphan (DELSYM) 30 MG/5ML liquid Take 15 mg by mouth as needed for cough.   Yes [provider]  metFORMIN (GLUCOPHAGE) 500 MG tablet Take 1 tablet by mouth 2 (two) times daily.   Yes [provider]  metoprolol succinate (TOPROL-XL) 50 MG 24 hr tablet Take 1 tablet (50 mg total) daily by mouth. Take with or immediately following a meal. Patient taking differently: Take 50 mg by mouth daily.  06/27/17 09/25/17 Yes Gollan, Kathlene November, MD  potassium chloride (K-DUR) 10 MEQ tablet Take 1 tablet (10 mEq total) daily by mouth. 07/07/17  Yes Gollan, Kathlene November, MD  ranitidine (ZANTAC) 150 MG tablet Take 1 tablet (150 mg total) by mouth 2 (two) times daily. 06/04/17  Yes Hackney, Otila Kluver A, FNP  rosuvastatin (CRESTOR) 20 MG tablet Take 1 tablet (20 mg total) by mouth daily. 06/02/17 08/31/17 Yes Hackney, Tina A, FNP  sacubitril-valsartan (ENTRESTO) 49-51 MG Take 1 tablet 2 (two) times daily by mouth. 06/27/17  Yes Minna Merritts, MD  spironolactone (ALDACTONE) 25 MG tablet Take 1 tablet (25 mg total) by mouth daily. 06/02/17  Yes Hackney, Otila Kluver A, FNP  sucralfate (CARAFATE) 1 g tablet Take 1 tablet (1 g total) by mouth 4 (four) times daily. 06/02/17  Yes Hackney, Otila Kluver A, FNP  torsemide (DEMADEX) 20 MG tablet Take 1 tablet (20 mg total) by mouth daily. 06/02/17  Yes Alisa Graff, FNP     Allergies Prednisone  Family History  Problem Relation Age of Onset  . Diabetes Mellitus II Mother   . Hypertension Father   . Cancer Paternal Aunt   . Cancer Maternal Grandfather     Social History Social History   Tobacco Use  . Smoking status: Current Some Day Smoker    Packs/day: 0.50    Years: 33.00    Pack years: 16.50    Types: Cigarettes  . Smokeless tobacco: Never Used  . Tobacco comment: currently smoking a few cigs/day. But was smoking 2-3 packs per day up until last year for almost 30 years  Substance Use Topics  . Alcohol use: No    Alcohol/week: 1.8 oz    Types: 3 Cans of beer per week    Frequency: Never    Comment: occassionally  . Drug use: No   Has not taken  his medications yet today  Review of Systems Constitutional: Subjective fevers and chills, fatigue eyes: No visual changes. ENT: See HPI cardiovascular: See HPI  respiratory: See HPI Gastrointestinal: No abdominal pain.  No nausea, no vomiting.  No diarrhea.  No constipation. Genitourinary: Negative for dysuria. Musculoskeletal: Negative for back pain.  Some general body aches Skin: Negative for rash. Neurological: Negative for headaches, focal weakness or numbness.    ____________________________________________   PHYSICAL EXAM:  VITAL SIGNS: ED Triage Vitals  Enc Vitals Group     BP 08/21/17 0844 (!) 189/123     Pulse Rate 08/21/17 0844 (!) 108     Resp 08/21/17 0844 20     Temp 08/21/17 0844 98.4 F (36.9 C)     Temp Source 08/21/17 0844 Oral     SpO2 08/21/17 0844 96 %     Weight 08/21/17 0844 260 lb (117.9 kg)     Height --      Head Circumference --      Peak Flow --      Pain Score 08/21/17 0843 8     Pain Loc --      Pain Edu? --      Excl. in [place]? --     Constitutional: Alert and oriented.  Mildly ill-appearing.  He is pleasant, sitting upright in no distress. Eyes: Conjunctivae are normal. Head: Atraumatic. Nose: No congestion/rhinnorhea. Mouth/Throat:  Mucous membranes are moist.  Posterior oropharynx is slightly injected.  No tonsillar exudates or edema are noted. Neck: No stridor.   Cardiovascular: Normal rate at approximately 95, regular rhythm. Grossly normal heart sounds.  Good peripheral circulation.  No wheezing. Respiratory: Normal respiratory effort.  No retractions. Lungs CTAB.  Speaks in full clear sentences. Gastrointestinal: Soft and nontender. No distention. Musculoskeletal: No lower extremity tenderness nor edema. Neurologic:  Normal speech and language. No gross focal neurologic deficits are appreciated.  Skin:  Skin is warm, dry and intact. No rash noted. Psychiatric: Mood and affect are normal. Speech and behavior are normal.  ____________________________________________   LABS (all labs ordered are listed, but only abnormal results are displayed)  Labs Reviewed  CBC WITH DIFFERENTIAL/PLATELET - Abnormal; Notable for the following components:      Result Value   RDW 14.7 (*)    All other components within normal limits  BASIC METABOLIC PANEL - Abnormal; Notable for the following components:   Glucose, Bld 281 (*)    All other components within normal limits  TROPONIN I - Abnormal; Notable for the following components:   Troponin I 0.10 (*)    All other components within normal limits  GROUP A STREP BY PCR  INFLUENZA PANEL BY PCR (TYPE A & B)   ____________________________________________  EKG  Reviewed into by me at 8:45 AM Heart rate 105 QRS 90 QTC 500 Sinus tachycardia, no evidence of acute ischemia is noted. ____________________________________________  RADIOLOGY  Dg Chest 2 View  Result Date: 08/21/2017 CLINICAL DATA:  Chest pain. EXAM: CHEST  2 VIEW COMPARISON:  Radiographs of June 30, 2017. FINDINGS: The heart size and mediastinal contours are within normal limits. Both lungs are clear. No pneumothorax or pleural effusion is noted. The visualized skeletal structures are unremarkable.  IMPRESSION: No active cardiopulmonary disease. Electronically Signed   By: Marijo Conception, M.D.   On: 08/21/2017 09:53   Chest x-ray, no acute reviewed by me ____________________________________________   PROCEDURES  Procedure(s) performed: None  Procedures  Critical Care performed: No ____________________________________________   INITIAL IMPRESSION /  ASSESSMENT AND PLAN / ED COURSE  Pertinent labs & imaging results that were available during my care of the patient were reviewed by me and considered in my medical decision making (see chart for details).  Patient presents for evaluation of increasing discomfort in his left chest with some mild shortness of breath at times, reporting nonproductive cough worsening over about 1 week's time.  Patient reports he feels as though he is developed pneumonia.  Recent exposure to multiple sick children.  Appears mildly ill, nontoxic overall.  In no acute distress with normal oxygen saturation.  Reassuring examination, but his symptoms sound primarily infectious.  Very atypical with regard to concern for acute cardiac, symptoms slowly presenting her a few days the left chest.  Denies pleuritic pain.  No history of pulmonary embolism.  No ripping tearing or moving pain.  Blood pressure elevated but also has not taken his medication at today.  Denies any headache, visual changes, etc.  Patient does however have extensive past medical history.  We will check labs, chest x-ray, EKG is reassuring, will check a troponin as well given his complex medical history.      ----------------------------------------- 12:25 PM on 08/21/2017 -----------------------------------------  Patient headache improving.  Report he got a headache after receiving nebulizer treatment.  Was throbbing and frontal in nature.  No associated neurologic symptoms.  Based on the patient's elevated troponin which is elevated from his baseline associated with left-sided chest  discomfort I discussed with him in feel based on his risk factors that he at least falls in a moderate risk for coronary disease.  Though I feel it is unlikely today's presentation is secondary to acute coronary syndrome, I discussed with him offered admission and he is amenable with plan for admission for observation of his chest discomfort and further workup on her hospitalist.  ____________________________________________   FINAL CLINICAL IMPRESSION(S) / ED DIAGNOSES  Final diagnoses:  Chest pain, moderate coronary artery risk  Upper respiratory tract infection, unspecified type      NEW MEDICATIONS STARTED DURING THIS VISIT:  This SmartLink is deprecated. Use AVSMEDLIST instead to display the medication list for a patient.   Note:  This document was prepared using Dragon voice recognition software and may include unintentional dictation errors.     Delman Kitten, MD 08/21/17 1226

## 2017-08-21 NOTE — Clinical Social Work Note (Signed)
CSW received consult for patient needing assistance with medications and trying to get a PCP.  Case manager can assist with this, CSW to sign off please reconsult if social work needs arise.  Jones Broom. [place], MSW, [place]  08/21/2017 2:07 PM

## 2017-08-21 NOTE — ED Triage Notes (Addendum)
Pt to ed with c/o tightness in chest and sob intermittently x 1 week worse with cough and sneezing.  Pt states has had cough, congestion, and ? Fever at home x 1 week.  Also c/o sore throat, earache, and headache.  Pt states he has not used BP meds this am.  BP at triage 189/123.

## 2017-08-21 NOTE — Care Management (Signed)
CM consult for medication needs and no pcp. Patient is followed at [facility].  Patient has been more compliant with his cardiac follow up.  Contacted [facility] and patient is obtaining his meds.  Informed that clinic has some meds that were filled in 07/16/17 that has not been picked up. Patient says that he has been sick and his father's car that he uses is not running.  CM will make attempt to obtain these meds for patient.  Patient is again trying to initiate another disability application.   Admitted with chest pain. Troponin mildly elevate. When speaking,patient appears short of breath but his oxygen saturations are within acceptable limits

## 2017-08-21 NOTE — ED Notes (Signed)
Patient transported to X-ray 

## 2017-08-22 LAB — BASIC METABOLIC PANEL
Anion gap: 9 (ref 5–15)
BUN: 18 mg/dL (ref 6–20)
CO2: 25 mmol/L (ref 22–32)
Calcium: 8.8 mg/dL — ABNORMAL LOW (ref 8.9–10.3)
Chloride: 102 mmol/L (ref 101–111)
Creatinine, Ser: 0.98 mg/dL (ref 0.61–1.24)
GFR calc Af Amer: >60 mL/min (ref >60)
GFR calc non Af Amer: >60 mL/min (ref >60)
Glucose, Bld: 270 mg/dL — ABNORMAL HIGH (ref 65–99)
Potassium: 3.9 mmol/L (ref 3.5–5.1)
Sodium: 136 mmol/L (ref 135–145)

## 2017-08-22 LAB — CBC
HCT: 50.4 % (ref 40.0–52.0)
Hemoglobin: 17.2 g/dL (ref 13.0–18.0)
MCH: 28.9 pg (ref 26.0–34.0)
MCHC: 34.1 g/dL (ref 32.0–36.0)
MCV: 85 fL (ref 80.0–100.0)
Platelets: 152 10*3/uL (ref 150–440)
RBC: 5.93 MIL/uL — ABNORMAL HIGH (ref 4.40–5.90)
RDW: 14.8 % — ABNORMAL HIGH (ref 11.5–14.5)
WBC: 8.1 10*3/uL (ref 3.8–10.6)

## 2017-08-22 LAB — TROPONIN I: Troponin I: 0.08 ng/mL (ref <0.03)

## 2017-08-22 LAB — GLUCOSE, CAPILLARY
Glucose-Capillary: 251 mg/dL — ABNORMAL HIGH (ref 65–99)
Glucose-Capillary: 248 mg/dL — ABNORMAL HIGH (ref 65–99)
Glucose-Capillary: 210 mg/dL — ABNORMAL HIGH (ref 65–99)
Glucose-Capillary: 238 mg/dL — ABNORMAL HIGH (ref 65–99)

## 2017-08-22 MED ORDER — LIVING WELL WITH DIABETES BOOK
Freq: Once | Status: DC
  Filled 2017-08-22 (×2): qty 1

## 2017-08-22 MED ORDER — TORSEMIDE 20 MG PO TABS
20.0000 mg | ORAL_TABLET | Freq: Every day | ORAL | Status: DC
  Administered 2017-08-22 – 2017-08-23 (×2): 20 mg via ORAL
  Filled 2017-08-22 (×2): qty 1

## 2017-08-22 MED ORDER — IPRATROPIUM-ALBUTEROL 0.5-2.5 (3) MG/3ML IN SOLN
3.0000 mL | Freq: Four times a day (QID) | RESPIRATORY_TRACT | Status: DC
  Administered 2017-08-22 – 2017-08-24 (×7): 3 mL via RESPIRATORY_TRACT
  Filled 2017-08-22 (×8): qty 3

## 2017-08-22 NOTE — Progress Notes (Signed)
Inpatient Diabetes Program Recommendations  AACE/ADA: New Consensus Statement on Inpatient Glycemic Control (2015)  Target Ranges:  Prepandial:   less than 140 mg/dL      Peak postprandial:   less than 180 mg/dL (1-2 hours)      Critically ill patients:  140 - 180 mg/dL   Lab Results  Component Value Date   GLUCAP 251 (H) 08/22/2017   HGBA1C 9.2 (H) 08/21/2017    Review of Glycemic Control  Results for Gabriel [name], Gabriel [name] (MRN [id number]) as of 08/22/2017 09:27  Ref. Range 08/21/2017 13:59 08/21/2017 16:53 08/21/2017 20:57 08/22/2017 08:04  Glucose-Capillary Latest Ref Range: 65 - 99 mg/dL 214 (H) 346 (H) 264 (H) 251 (H)   Diabetes history: Type 2 Outpatient Diabetes medications: Metformin 500mg  bid  Current orders for Inpatient glycemic control: Metformin 500 mg bid, Novolog 0-9 units tid, Novolog 0-5 units qhs  Inpatient Diabetes Program Recommendations: Consider adding Lantus 12 units qhs (fasting blood sugar 251mg /dl) and increase Novolog correction to moderate 0-15 units tid.   MD-  A1C 9.2%- if you anticipate this patient will go home on insulin, please notify case management so they can assist in obtaining insulin. A1C on 04/28/17 was 7.1%    Gentry Fitz, RN, [place], [place], CDE Diabetes Coordinator Inpatient Diabetes Program  [phone] (Team Pager) [phone] ([place]) 08/22/2017 9:33 AM

## 2017-08-22 NOTE — Progress Notes (Addendum)
Gabriel [name] NAME: Gabriel [name]    MR#:  [id number]  DATE OF BIRTH:  [birth year]  SUBJECTIVE:  CHIEF COMPLAINT:   Chief Complaint  Patient presents with  . Chest Pain   Came with complaint of chest tightness. Noted to have bronchitis, still feels short of breath with minimal exertion and chest tightness and requesting to stay in hospital. REVIEW OF SYSTEMS:  CONSTITUTIONAL: No fever, fatigue or weakness.  EYES: No blurred or double vision.  EARS, NOSE, AND THROAT: No tinnitus or ear pain.  RESPIRATORY: No cough, shortness of breath, wheezing or hemoptysis.  CARDIOVASCULAR: No chest pain, orthopnea, edema.  GASTROINTESTINAL: No nausea, vomiting, diarrhea or abdominal pain.  GENITOURINARY: No dysuria, hematuria.  ENDOCRINE: No polyuria, nocturia,  HEMATOLOGY: No anemia, easy bruising or bleeding SKIN: No rash or lesion. MUSCULOSKELETAL: No joint pain or arthritis.   NEUROLOGIC: No tingling, numbness, weakness.  PSYCHIATRY: No anxiety or depression.   ROS  DRUG ALLERGIES:   Allergies  Allergen Reactions  . Prednisone Other (See Comments)    Reaction: Hallucinations     VITALS:  Blood pressure (!) 146/77, pulse 76, temperature 98.2 F (36.8 C), temperature source Oral, resp. rate 18, height 6' (1.829 m), weight 118.1 kg (260 lb 6.4 oz), SpO2 96 %.  PHYSICAL EXAMINATION:  GENERAL:  47 y.o.-year-old patient lying in the bed with no acute distress.  EYES: Pupils equal, round, reactive to light and accommodation. No scleral icterus. Extraocular muscles intact.  HEENT: Head atraumatic, normocephalic. Oropharynx and nasopharynx clear.  NECK:  Supple, no jugular venous distention. No thyroid enlargement, no tenderness.  LUNGS: Normal breath sounds bilaterally, some wheezing, no crepitation. No use of accessory muscles of respiration.  CARDIOVASCULAR: S1, S2 normal. No murmurs, rubs, or gallops.  ABDOMEN: Soft, nontender,  nondistended. Bowel sounds present. No organomegaly or mass.  EXTREMITIES: No pedal edema, cyanosis, or clubbing.  NEUROLOGIC: Cranial nerves II through XII are intact. Muscle strength 5/5 in all extremities. Sensation intact. Gait not checked.  PSYCHIATRIC: The patient is alert and oriented x 3.  SKIN: No obvious rash, lesion, or ulcer.   Physical Exam LABORATORY PANEL:   CBC Recent Labs  Lab 08/22/17 0452  WBC 8.1  HGB 17.2  HCT 50.4  PLT 152   ------------------------------------------------------------------------------------------------------------------  Chemistries  Recent Labs  Lab 08/22/17 0452  NA 136  K 3.9  CL 102  CO2 25  GLUCOSE 270*  BUN 18  CREATININE 0.98  CALCIUM 8.8*   ------------------------------------------------------------------------------------------------------------------  Cardiac Enzymes Recent Labs  Lab 08/21/17 1957 08/22/17 0149  TROPONINI 0.10* 0.08*   ------------------------------------------------------------------------------------------------------------------  RADIOLOGY:  Dg Chest 2 View  Result Date: 08/21/2017 CLINICAL DATA:  Chest pain. EXAM: CHEST  2 VIEW COMPARISON:  Radiographs of June 30, 2017. FINDINGS: The heart size and mediastinal contours are within normal limits. Both lungs are clear. No pneumothorax or pleural effusion is noted. The visualized skeletal structures are unremarkable. IMPRESSION: No active cardiopulmonary disease. Electronically Signed   By: Marijo Conception, M.D.   On: 08/21/2017 09:53    ASSESSMENT AND PLAN:   Active Problems:   Chest pain  Gabriel [name]  is a 47 y.o. male with a known history of chronic chest pain, COPD, active smoker, CKD, hypertension, nonischemic cardiomyopathy with the systolic CHF, psoriasis comes from home secondary to worsening cough, chest pain and shortness of breath going on for a week now.  1. Chest pain-likely musculoskeletal pain or from bronchitis -Treat  his cough, muscle relaxants for chest pain -Patient's troponin is elevated, stable troponins on follow ups -Just had a cardiac catheterization done in May 2018 with nonobstructive coronary artery disease. Monitor closely -Started on Z-Pak for his bronchitis symptoms - Encourage incentive spirometer.  2. Elevated troponin-could be demand ischemia as mentioned above. Monitor on telemetry and recycle troponins.  3. Nonischemic cardiomyopathy-known systolic and diastolic dysfunction. EF of 35% -Continue cardiac medications. Patient on entresto, Aldactone, metoprolol and statin - Given IV lasix on admission, but no signs of exacerbation, change back to oral torsemide.  4. Diabetes mellitus-on metformin. Start sliding scale insulin.  5. DVT prophylaxis-on Lovenox  6. Tobacco use disorder-started on nicotine patch   Counseled for 4 min to quit.   All the records are reviewed and case discussed with Care Management/Social Workerr. Management plans discussed with the patient, family and they are in agreement.  CODE STATUS: Full.  TOTAL TIME TAKING CARE OF THIS PATIENT: 35 minutes.   POSSIBLE D/C IN 1-2 DAYS, DEPENDING ON CLINICAL CONDITION.   Vaughan Basta M.D on 08/22/2017   Between 7am to 6pm - Pager - [phone]  After 6pm go to [email or web address] - password [email or web address] [facility]  Office  [phone]  CC: Primary care physician; Center, [facility]  Note: This dictation was prepared with Dragon dictation along with smaller phrase technology. Any transcriptional errors that result from this process are unintentional.

## 2017-08-22 NOTE — Progress Notes (Signed)
Inpatient Diabetes Program Recommendations  AACE/ADA: New Consensus Statement on Inpatient Glycemic Control (2015)  Target Ranges:  Prepandial:   less than 140 mg/dL      Peak postprandial:   less than 180 mg/dL (1-2 hours)      Critically ill patients:  140 - 180 mg/dL   Lab Results  Component Value Date   GLUCAP 248 (H) 08/22/2017   HGBA1C 9.2 (H) 08/21/2017   Spoke to patient at the bedside regarding recent A1C 9.2% up from 7.1% 4 months ago. Patient complains of inability to get disability and has no insurance.  Has not been able to get into cardiac rehab. In the past.  Discussed the benefit of exercise- his dad would be able to take him to a gym. He does not have any exercise equipment at home.   Discussed home diabetes medications was taking Metformin 500 mg bid as ordered.  Discussed the use of insulin while inpatient as the gold standard of care.  Reviewed the need for low fat, heart healthy diet using the plate method and pictures to explain.  I have ordered the Living Well with Diabetes book- he did not want to see a dietitian. Patient verbalized understanding meal planning but admits to having very little input into meal planning and buying because he depends on his dad and girlfriend for their assistance.  He is able to make breakfast and lunch independently.  Patient has no blood sugar meter- will need to obtain one at [facility] or at the [facility].  Gentry Fitz, RN, BA, MHA, CDE Diabetes Coordinator Inpatient Diabetes Program  [phone] (Team Pager) [phone] ([place]) 08/22/2017 12:55 PM

## 2017-08-22 NOTE — Progress Notes (Addendum)
Patient admitted with chest pain, elevated troponin, nonischemic cardiomyopathy, DM, tobacco abuse.  Patient has hx of CKD, HTN, systolic CHF, psoriasis, non-obstructive CAD, Obesity.    CHF Education:?? Educational session with patient completed. ? Provided patient with "Living Better with Heart Failure" packet. Briefly reviewed definition of heart failure and signs and symptoms of an exacerbation.  Discussed the meaning of EF and his preliminary value as compared to normal value.??Explained to patient that HF is a chronic illness which requires self-assessment / self-management along with help from the cardiologist/PCP/HF Clinic.??? ?? *Reviewed importance of and reason behind checking weight daily in the AM, after using the bathroom, but before getting dressed.?Patient has scales.??Patient informed this RN that he does weigh himself every morning.  He informed this RN that his girlfriend and her children had been sick and they just kept passing germs around and that is why he thinks he ended up in the hospital.  Patient stated he never had swelling in his legs. He reports all of his symptoms as being respiratory related.?Encouraged patient to continue weighing himself  and explained the importance of doing so along with assessing his symptoms daily.   ? Reviewed the following information with patient:  *Discussed when to call the Dr= weight gain of >2-3lb overnight of 5lb in a week,  *Discussed yellow zone= call MD: weight gain of >2-3lb overnight of 5lb in a week, increased swelling, increased SOB when lying down, chest discomfort, dizziness, increased fatigue *Red Zone= call 911: struggle to breath, fainting or near fainting, significant chest pain  ? *Reviewed low sodium diet-provided handout of recommended and not recommended foods. ?Reviewed reading labels with patient. Discussed fluid intake with patient as well. Patient not currently on a fluid restriction, but advised no more than 8-8 ounces  glass of fluids per day.?  *Instructed patient to take medications as prescribed for heart failure. Explained briefly why pt is on the medications (either make you feel better, live longer or keep you out of the hospital) and discussed monitoring and side effects.   *Smoking Cessation - Patient informed this RN that he only smokes maybe one cigarette per month.  Tips on quitting discussed, given, and reviewed with patient.  ? *Discussed the benefits of exercise. Patient informed this RN that he does not currently exercise.  Patient stated he is unable to work.  Explained to patient that he is a candidate for Cardiac/Pulmoanry Rehab given his diagnosis of HF. ?However, patient has no payor source. ?Patient informed this RN that he is unable to pay out-of-pocket for Cardiac Rehab. ?Exercise encouraged.  Patient encouraged to remain as active as possible.?Discussed patient joining [employer] for $10 per month.  If he is unable to join [employer], then patinet was encourage walking and gradually increasing to 30 minutes per day up to 150 minutes per week per AHA guidelines. ?  Patient talked about how he has applied for disability but has been denied three or more times.  Patient stated, "I do not understand why I have not been approved, as [name], NP and [name] know I am unable to work."  Reiterated the importance of taking medications as prescribed and following the 5 steps to living better with heart failure.    *[facility]- Patient is followed in the HF Clinic and has an appointment on 09/02/2017 at 9 a.m.  Patient encouraged to keep this appointment. ?  ? Once again, this nurse reviewed the 5 Steps to Living Better with  Heart Failure. ?Patient thanked me for providing the above information. ?  Roanna Epley, RN, BSN, [facility] Cardiovascular [facility] Pulmonary Nurse Navigator

## 2017-08-23 ENCOUNTER — Encounter: Payer: Self-pay | Admitting: Radiology

## 2017-08-23 ENCOUNTER — Inpatient Hospital Stay: Payer: Self-pay

## 2017-08-23 DIAGNOSIS — R079 Chest pain, unspecified: Secondary | ICD-10-CM

## 2017-08-23 DIAGNOSIS — I5042 Chronic combined systolic (congestive) and diastolic (congestive) heart failure: Secondary | ICD-10-CM

## 2017-08-23 DIAGNOSIS — R42 Dizziness and giddiness: Secondary | ICD-10-CM

## 2017-08-23 LAB — TROPONIN I
Troponin I: 0.06 ng/mL (ref <0.03)
Troponin I: 0.06 ng/mL (ref <0.03)
Troponin I: 0.05 ng/mL (ref <0.03)

## 2017-08-23 LAB — GLUCOSE, CAPILLARY
Glucose-Capillary: 226 mg/dL — ABNORMAL HIGH (ref 65–99)
Glucose-Capillary: 280 mg/dL — ABNORMAL HIGH (ref 65–99)
Glucose-Capillary: 262 mg/dL — ABNORMAL HIGH (ref 65–99)
Glucose-Capillary: 215 mg/dL — ABNORMAL HIGH (ref 65–99)
Glucose-Capillary: 249 mg/dL — ABNORMAL HIGH (ref 65–99)
Glucose-Capillary: 234 mg/dL — ABNORMAL HIGH (ref 65–99)
Glucose-Capillary: 216 mg/dL — ABNORMAL HIGH (ref 65–99)

## 2017-08-23 MED ORDER — ALPRAZOLAM 0.25 MG PO TABS
0.2500 mg | ORAL_TABLET | Freq: Three times a day (TID) | ORAL | Status: DC | PRN
  Administered 2017-08-23: 0.25 mg via ORAL
  Filled 2017-08-23: qty 1

## 2017-08-23 MED ORDER — OXYMETAZOLINE HCL 0.05 % NA SOLN
1.0000 | Freq: Two times a day (BID) | NASAL | Status: DC | PRN
  Filled 2017-08-23: qty 15

## 2017-08-23 MED ORDER — FUROSEMIDE 10 MG/ML IJ SOLN
20.0000 mg | Freq: Three times a day (TID) | INTRAMUSCULAR | Status: DC
  Administered 2017-08-23: 20 mg via INTRAVENOUS
  Filled 2017-08-23: qty 2

## 2017-08-23 MED ORDER — SODIUM CHLORIDE 0.9% FLUSH
3.0000 mL | Freq: Two times a day (BID) | INTRAVENOUS | Status: DC
  Administered 2017-08-23: 3 mL via INTRAVENOUS

## 2017-08-23 MED ORDER — IOPAMIDOL (ISOVUE-370) INJECTION 76%
75.0000 mL | Freq: Once | INTRAVENOUS | Status: AC | PRN
  Administered 2017-08-23: 75 mL via INTRAVENOUS

## 2017-08-23 NOTE — Progress Notes (Addendum)
Nurse tech attempted to walk patient. Pt walked to the nurses station and started feeling light headed and short of breath. A wheelchair was brought to the patient and taken back to the room. After putting patient back in the bed, he stated he felt a little better. MD notified, new orders were given. Anti-anxiety medication was given.Will continue to monitor patient.

## 2017-08-23 NOTE — Consult Note (Addendum)
Cardiology Consultation:   Patient ID: Gabriel [name].; [id number]; 1971-04-11   Admit date: 08/21/2017 Date of Consult: 08/23/2017  Primary Care Provider: [facility], [facility] Primary Cardiologist: [name]   Patient Profile:   Gabriel [name]. is a 47 y.o. male with a hx chronic systolic and diastolic heart failure, non-obstructive CAD, hypertension, NSVT, and non-compliance of who is being seen today for the evaluation of chest pain at the request of Dr. Anselm Jungling.  History of Present Illness:   Gabriel [name] was admitted 1/3 with chest pain and shortness of breath for the preceding week. He has experienced chills, shortness of breath and non-productive cough.  The chest pain has been constant and is worse at night.  It is worse with palpation of his chest.  In the ED he was hypertensive to 167/105.  Troponin was elevated to 0.11 which is above his chronically elevated baseline of 0.03 to 0.06. EKG revealed sinus rhythm with no evidence of ischemia.  CXR was unremarkable.  He was thought to have bronchitis and was supposed to be discharged 1/5.  However, he developed dizziness and weakness while walking with the nurse.  Orthostatic vitals were unremarkable and CT PE protocol was negative for PE.  Cardiology was consulted for further evaluation.   Gabriel [name] was hospitalized 12/2014 with atypical chest pain and hypertension.  Nuclear stress testing at that time revealed LVEF 30-44% and there was a small defect in the apex that was thought to be apical thinning defect.  Troponin was mildly elevated.  His cardiomyopathy was thought to be nonischemic 2/2 hypertension. July 2017 he had an episode of syncope while in the kitchen.  He reported palpitations and presyncope and wore a 48 hour Holter 02/2016 that showed runs of atrial tachycardia and a short episode of NSVT.   He was also admitted with chest pain and pneumonia at that time.  Cardiac enzymes were mildly elevated to a peak of  1.05.  Echo 11/2016 showed an improvement in LVEF to 40-45%.  However, he was seen with chest pain and elevated troponin 12/2016.  LV gram revealed LVEF 30-35% and  LHC  showed 40% RCA disease but was otherwise unremarkable.  Gabriel [name] was hospitalized 04/2017 with orthostatic lightheadedness in the setting of intravascular volume depletion and AKI.  Spironolactone was discontinued that admission.  His LVEF had improved to 45-50% at that time.  He last saw [name] 06/2017 after a recent ED visit for hypertension.  Past Medical History:  Diagnosis Date  . Chest wall pain, chronic   . Chronic systolic CHF (congestive heart failure) ([place])    a. 03/2015 Echo: EF 45-50%; b. 12/2015 Echo: EF 20-25%; c. 02/2016 Echo: EF 30-35%; d. 11/2016 Echo: EF 40-45%.  . Chronic Troponin Elevation   . CKD (chronic kidney disease), stage III ([place])   . COPD (chronic obstructive pulmonary disease) ([place])   . Hypertension    Resolved since weight loss  . NICM (nonischemic cardiomyopathy) ([place])    a. 03/2015 Echo: EF 45-50%; b. 04/2015 MV: small defect of mild severity in apex 2/2 apical thinning, EF 30-44%;  c. 12/2015 Echo: EF 20-25%; d. 02/2016 Echo: EF 30-35%; e. 11/2016 Echo: EF 40-45%, diff HK, mildly dil Ao root, mild MR, mod dil LA;  f. 12/2016 Cath: LM nl, LAD/Diags/LCX/OMs min irregs, RCA 40p/m/d.  . Non-obstructive CAD (coronary artery disease)    a. 04/2015 low risk MV;  b. 12/2016 Cath: minor irregs in LAD/Diag/LCX/OM, RCA 40p/m/d.  Marland Kitchen  NSVT (nonsustained ventricular tachycardia) ([place])    a. 12/2015 noted on tele-->amio;  b. 12/2015 Event monitor: no VT. Atach noted.  . Obesity (BMI 30.0-34.9)   . Psoriasis   . Syncope    a. 01/2016 - felt to be vasovagal.  . Tobacco abuse     Past Surgical History:  Procedure Laterality Date  . AMPUTATION    . FINGER AMPUTATION     Traumatic  . LEFT HEART CATH AND CORONARY ANGIOGRAPHY N/A 01/06/2017   Procedure: Left Heart Cath and Coronary Angiography;  Surgeon: Wellington Hampshire,  MD;  Location: [facility];  Service: Cardiovascular;  Laterality: N/A;     Home Medications:  Prior to Admission medications   Medication Sig Start Date End Date Taking? Authorizing Provider  amiodarone (PACERONE) 200 MG tablet Take 200 mg by mouth daily.   Yes [provider]  amLODipine (NORVASC) 5 MG tablet Take 1 tablet (5 mg total) daily by mouth. 06/30/17 06/30/18 Yes Eula Listen, MD  aspirin 81 MG chewable tablet Chew 1 tablet (81 mg total) by mouth daily. Reported on 02/08/2016 06/02/17  Yes [name] A, FNP  dextromethorphan (DELSYM) 30 MG/5ML liquid Take 15 mg by mouth as needed for cough.   Yes [provider]  metFORMIN (GLUCOPHAGE) 500 MG tablet Take 1 tablet by mouth 2 (two) times daily.   Yes [provider]  metoprolol succinate (TOPROL-XL) 50 MG 24 hr tablet Take 1 tablet (50 mg total) daily by mouth. Take with or immediately following a meal. Patient taking differently: Take 50 mg by mouth daily.  06/27/17 09/25/17 Yes Gollan, Kathlene November, MD  potassium chloride (K-DUR) 10 MEQ tablet Take 1 tablet (10 mEq total) daily by mouth. 07/07/17  Yes Gollan, Kathlene November, MD  ranitidine (ZANTAC) 150 MG tablet Take 1 tablet (150 mg total) by mouth 2 (two) times daily. 06/04/17  Yes Hackney, Otila Kluver A, FNP  rosuvastatin (CRESTOR) 20 MG tablet Take 1 tablet (20 mg total) by mouth daily. 06/02/17 08/31/17 Yes Hackney, Tina A, FNP  sacubitril-valsartan (ENTRESTO) 49-51 MG Take 1 tablet 2 (two) times daily by mouth. 06/27/17  Yes Minna Merritts, MD  spironolactone (ALDACTONE) 25 MG tablet Take 1 tablet (25 mg total) by mouth daily. 06/02/17  Yes Hackney, Otila Kluver A, FNP  sucralfate (CARAFATE) 1 g tablet Take 1 tablet (1 g total) by mouth 4 (four) times daily. 06/02/17  Yes Hackney, Otila Kluver A, FNP  torsemide (DEMADEX) 20 MG tablet Take 1 tablet (20 mg total) by mouth daily. 06/02/17  Yes Alisa Graff, FNP    Inpatient Medications: Scheduled Meds: .  amiodarone  200 mg Oral Daily  . aspirin  81 mg Oral Daily  . azithromycin  500 mg Oral Daily  . chlorpheniramine-HYDROcodone  5 mL Oral Q12H  . docusate sodium  100 mg Oral BID  . enoxaparin (LOVENOX) injection  40 mg Subcutaneous Q24H  . insulin aspart  0-5 Units Subcutaneous QHS  . insulin aspart  0-9 Units Subcutaneous TID WC  . ipratropium-albuterol  3 mL Nebulization Q6H  . living well with diabetes book   Does not apply Once  . metFORMIN  500 mg Oral BID WC  . metoprolol succinate  50 mg Oral Daily  .  morphine injection  2 mg Intravenous Once  . nicotine  21 mg Transdermal Daily  . rosuvastatin  20 mg Oral Daily  . sacubitril-valsartan  1 tablet Oral BID  . spironolactone  25 mg Oral Daily  .  torsemide  20 mg Oral Daily   Continuous Infusions:  PRN Meds: acetaminophen **OR** acetaminophen, ALPRAZolam, HYDROcodone-acetaminophen, menthol-cetylpyridinium, ondansetron **OR** ondansetron (ZOFRAN) IV, polyethylene glycol  Allergies:    Allergies  Allergen Reactions  . Prednisone Other (See Comments)    Reaction: Hallucinations     Social History:   Social History   Socioeconomic History  . Marital status: Widowed    Spouse name: Not on file  . Number of children: Not on file  . Years of education: Not on file  . Highest education level: Not on file  Social Needs  . Financial resource strain: Not on file  . Food insecurity - worry: Not on file  . Food insecurity - inability: Not on file  . Transportation needs - medical: Not on file  . Transportation needs - non-medical: Not on file  Occupational History  . Not on file  Tobacco Use  . Smoking status: Current Some Day Smoker    Packs/day: 0.50    Years: 33.00    Pack years: 16.50    Types: Cigarettes  . Smokeless tobacco: Never Used  . Tobacco comment: currently smoking a few cigs/day. But was smoking 2-3 packs per day up until last year for almost 30 years  Substance and Sexual Activity  . Alcohol use: No     Alcohol/week: 1.8 oz    Types: 3 Cans of beer per week    Frequency: Never    Comment: occassionally  . Drug use: No  . Sexual activity: Not on file  Other Topics Concern  . Not on file  Social History Narrative   Independent at baseline. Lives either with dad or girlfriend    Family History:    Family History  Problem Relation Age of Onset  . Diabetes Mellitus II Mother   . Hypertension Father   . Cancer Paternal Aunt   . Cancer Maternal Grandfather      ROS:  Please see the history of present illness.  ROS  All other ROS reviewed and negative.     Physical Exam/Data:   Vitals:   08/23/17 1105 08/23/17 1111 08/23/17 1237 08/23/17 1238  BP: (!) 148/94 (!) 136/98 140/87 131/82  Pulse:   76 82  Resp:      Temp:      TempSrc:      SpO2:      Weight:      Height:        Intake/Output Summary (Last 24 hours) at 08/23/2017 1601 Last data filed at 08/23/2017 1015 Gross per 24 hour  Intake 240 ml  Output 2100 ml  Net -1860 ml   Filed Weights   08/21/17 0938 08/21/17 1341 08/23/17 0601  Weight: 260 lb (117.9 kg) 260 lb 6.4 oz (118.1 kg) 258 lb 12.8 oz (117.4 kg)   Body mass index is 35.1 kg/m.   VS:  BP 119/80 (BP Location: Right Arm)   Pulse 81   Temp 98.3 F (36.8 C) (Oral)   Resp 18   Ht 6' (1.829 m)   Wt 258 lb 12.8 oz (117.4 kg)   SpO2 94%   BMI 35.10 kg/m  , BMI Body mass index is 35.1 kg/m. GENERAL:  Well appearing HEENT: Pupils equal round and reactive, fundi not visualized, oral mucosa unremarkable NECK:  No jugular venous distention, waveform within normal limits, carotid upstroke brisk and symmetric, no bruits, no thyromegaly LYMPHATICS:  No cervical adenopathy LUNGS:  Clear to auscultation bilaterally HEART:  RRR.  PMI not  displaced or sustained,S1 and S2 within normal limits, no S3, no S4, no clicks, no rubs, no murmurs ABD:  Flat, positive bowel sounds normal in frequency in pitch, no bruits, no rebound, no guarding, no midline pulsatile mass,  no hepatomegaly, no splenomegaly EXT:  2 plus pulses throughout, no edema, no cyanosis no clubbing SKIN:  No rashes no nodules NEURO:  Cranial nerves II through XII grossly intact, motor grossly intact throughout PSYCH:  Cognitively intact, oriented to person place and time   EKG:  The EKG was personally reviewed and demonstrates:  Sinus rhythm.  Rate 77 bpm.  QTc 491 ms.  Telemetry:  Telemetry was personally reviewed and demonstrates:  Sinus rhythm  7 beat and 4 beat runs of NSVT.   Relevant CV Studies: Echo 04/29/17: Study Conclusions  - Left ventricle: The cavity size was normal. Wall thickness was   increased in a pattern of mild LVH. Systolic function was mildly   reduced. The estimated ejection fraction was in the range of 45%   to 50%. Doppler parameters are consistent with abnormal left   ventricular relaxation (grade 1 diastolic dysfunction). - Aortic valve: Probably trileaflet; mildly thickened, mildly   calcified leaflets. There was mild regurgitation directed   eccentrically in the LVOT. - Left atrium: The atrium was mildly dilated. - Right ventricle: The cavity size was normal. Systolic function   was normal. - Pericardium, extracardiac: A trivial pericardial effusion was   identified posterior to the heart.  LHC 01/06/17:  There is moderate to severe left ventricular systolic dysfunction.  LV end diastolic pressure is moderately elevated.  The left ventricular ejection fraction is 25-35% by visual estimate.  Dist RCA lesion, 40 %stenosed.  Prox RCA to Mid RCA lesion, 40 %stenosed.   1. Mild nonobstructive coronary artery disease. 2. Moderately to severely reduced LV systolic function with an EF of 30-35%. 3. Moderately elevated left ventricular end-diastolic pressure. LVEDP was in the high 20s.  Laboratory Data:  Chemistry Recent Labs  Lab 08/21/17 0925 08/22/17 0452  NA 136 136  K 3.5 3.9  CL 103 102  CO2 23 25  GLUCOSE 281* 270*  BUN 11 18    CREATININE 1.00 0.98  CALCIUM 9.1 8.8*  GFRNONAA >60 >60  GFRAA >60 >60  ANIONGAP 10 9    No results for input(s): PROT, ALBUMIN, AST, ALT, ALKPHOS, BILITOT in the last 168 hours. Hematology Recent Labs  Lab 08/21/17 0925 08/22/17 0452  WBC 8.3 8.1  RBC 5.81 5.93*  HGB 16.5 17.2  HCT 48.9 50.4  MCV 84.2 85.0  MCH 28.5 28.9  MCHC 33.8 34.1  RDW 14.7* 14.8*  PLT 152 152   Cardiac Enzymes Recent Labs  Lab 08/21/17 0925 08/21/17 1351 08/21/17 1957 08/22/17 0149 08/23/17 1239 08/23/17 1526  TROPONINI 0.10* 0.11* 0.10* 0.08* 0.06* 0.06*   No results for input(s): TROPIPOC in the last 168 hours.  BNPNo results for input(s): BNP, PROBNP in the last 168 hours.  DDimer No results for input(s): DDIMER in the last 168 hours.  Radiology/Studies:  Dg Chest 2 View  Result Date: 08/21/2017 CLINICAL DATA:  Chest pain. EXAM: CHEST  2 VIEW COMPARISON:  Radiographs of June 30, 2017. FINDINGS: The heart size and mediastinal contours are within normal limits. Both lungs are clear. No pneumothorax or pleural effusion is noted. The visualized skeletal structures are unremarkable. IMPRESSION: No active cardiopulmonary disease. Electronically Signed   By: Marijo Conception, M.D.   On: 08/21/2017 09:53   Ct  Angio Chest Pe W Or Wo Contrast  Result Date: 08/23/2017 CLINICAL DATA:  Increased shortness of breath and chest tightness over the past several days. Evaluate pulmonary embolism. EXAM: CT ANGIOGRAPHY CHEST WITH CONTRAST TECHNIQUE: Multidetector CT imaging of the chest was performed using the standard protocol during bolus administration of intravenous contrast. Multiplanar CT image reconstructions and MIPs were obtained to evaluate the vascular anatomy. CONTRAST:  69mL ISOVUE-370 IOPAMIDOL (ISOVUE-370) INJECTION 76% COMPARISON:  Chest radiograph - 08/21/2017; chest CT - 01/28/2017 ; 03/20/2015 FINDINGS: Vascular Findings: There is adequate opacification of the pulmonary arterial system with the  main pulmonary artery measuring 320 Hounsfield units. No discrete filling defects are seen within the pulmonary arterial tree to suggest pulmonary embolism. Normal caliber of the main pulmonary artery. Cardiomegaly. Coronary artery calcifications. No pericardial effusion. Normal caliber the thoracic aorta. Conventional configuration of the aortic [name]. The branch vessels of the aortic [name] appear patent throughout their imaged course. Review of the MIP images confirms the above findings. ---------------------------------------------------------------------------------- Nonvascular Findings: Mediastinum/Lymph Nodes: Scattered mediastinal lymph nodes are numerous though individually not enlarged by size criteria with index right paratracheal lymph node measuring 0.9 cm in greatest short axis diameter (image 24, series 4), index right paratracheal lymph node measuring 1 cm (image 33), index precarinal lymph node measuring 0.9 cm (image 36), index right suprahilar lymph node measuring 0.9 cm (image 41) and index prevascular lymph node measures 0.8 cm (image 33), improved compared to prior examinations. No bulky or worsening mediastinal, hilar or axillary lymphadenopathy. Lungs/Pleura: Evaluation of the pulmonary parenchyma is degraded secondary to patient respiratory artifact. Scattered ill-defined areas of ground-glass may represent air trapping. No discrete focal airspace opacities. No pleural effusion or pneumothorax. The central pulmonary airways appear widely patent No discrete pulmonary nodules given limitation of the examination. Upper abdomen: Limited early arterial phase evaluation of the upper abdomen demonstrates mild nodularity hepatic contour as could be seen in the setting of early cirrhotic change. Musculoskeletal: She no acute or aggressive osseous abnormalities. Stigmata of DISH within the thoracic spine. Regional soft tissues appear normal. Normal appearance of the thyroid gland. IMPRESSION: 1.  Suspected mild airways disease. Otherwise, no explanation for patient's chest pain and shortness of breath. Specifically, no evidence of pulmonary embolism or focal airspace opacities to suggest pneumonia. 2. Improved mediastinal and hilar adenopathy. 3. Mild nodularity hepatic contour could be indicative of early cirrhotic change. Further evaluation with LFTs could be performed as indicated. 4. Coronary artery calcifications. 5. Aortic Atherosclerosis (ICD10-I70.0). Electronically Signed   By: Sandi Mariscal M.D.   On: 08/23/2017 12:30    Assessment and Plan:   # Dizziness: Likely due to sinus congestion from his cold.  He has no orthostatic vital sign changes and no arrhythmias on telemetry.  We will give Afrin to see if this helps.  # Chronic systolic and diastolic heart failure: # Hypertension:  Gabriel [name] appears euvolemic on exam.  No pulmonary edema on chest CT.  Will add on a BNP to be sure.  Continue Entresto, metoprolol, spironolactone.  # NSVT: Two short runs of NSVT this am. This was not when he had dizziness and was asymptomatic.  Continue amiodarone and metorpolol.  # Non-obstructive CAD: # Mildly elevated troponin: # Hyperlipidemia: Non-obstructive disease on cath.  His chest pain is reproducible with palpation.  Continue aspirin, metoprolol and rosuvastatin.   No further testing indicated from a cardiac standpoint.   For questions or updates, please contact [place] Please consult [email or web address] for contact info under  Cardiology/STEMI.   Signed, Skeet Latch, MD  08/23/2017 4:01 PM

## 2017-08-23 NOTE — Progress Notes (Signed)
CCMD reports patient had 7 beats of VTach. Patient asymptomatic, resting comfortably in bed. MD Anselm Jungling made aware. No new orders at this time. Will continue to monitor.   Iran Sizer M

## 2017-08-23 NOTE — Progress Notes (Signed)
[facility] [place] NAME: Gabriel [name]    MR#:  [id number]  DATE OF BIRTH:  12/13/1970  SUBJECTIVE:  CHIEF COMPLAINT:   Chief Complaint  Patient presents with  . Chest Pain   Came with complaint of chest tightness. Noted to have bronchitis, felt little better this morning. But while trying to walk, he had severe chest tightness and dizziness.  REVIEW OF SYSTEMS:  CONSTITUTIONAL: No fever, fatigue or weakness.  EYES: No blurred or double vision.  EARS, NOSE, AND THROAT: No tinnitus or ear pain.  RESPIRATORY: No cough, shortness of breath, wheezing or hemoptysis.  CARDIOVASCULAR: No chest pain, orthopnea, edema.  GASTROINTESTINAL: No nausea, vomiting, diarrhea or abdominal pain.  GENITOURINARY: No dysuria, hematuria.  ENDOCRINE: No polyuria, nocturia,  HEMATOLOGY: No anemia, easy bruising or bleeding SKIN: No rash or lesion. MUSCULOSKELETAL: No joint pain or arthritis.   NEUROLOGIC: No tingling, numbness, weakness.  PSYCHIATRY: No anxiety or depression.   ROS  DRUG ALLERGIES:   Allergies  Allergen Reactions  . Prednisone Other (See Comments)    Reaction: Hallucinations     VITALS:  Blood pressure 131/82, pulse 82, temperature 98 F (36.7 C), temperature source Oral, resp. rate 18, height 6' (1.829 m), weight 117.4 kg (258 lb 12.8 oz), SpO2 95 %.  PHYSICAL EXAMINATION:  GENERAL:  47 y.o.-year-old patient lying in the bed with acute distress. Fast breathing. EYES: Pupils equal, round, reactive to light and accommodation. No scleral icterus. Extraocular muscles intact.  HEENT: Head atraumatic, normocephalic. Oropharynx and nasopharynx clear.  NECK:  Supple, no jugular venous distention. No thyroid enlargement, no tenderness.  LUNGS: Normal breath sounds bilaterally, some wheezing, no crepitation. No use of accessory muscles of respiration. Fast breathing. CARDIOVASCULAR: S1, S2 normal. No murmurs, rubs, or gallops.  ABDOMEN:  Soft, nontender, nondistended. Bowel sounds present. No organomegaly or mass.  EXTREMITIES: No pedal edema, cyanosis, or clubbing.  NEUROLOGIC: Cranial nerves II through XII are intact. Muscle strength 5/5 in all extremities. Sensation intact. Gait not checked.  PSYCHIATRIC: The patient is alert and oriented x 3. Appears anxious. SKIN: No obvious rash, lesion, or ulcer.   Physical Exam LABORATORY PANEL:   CBC Recent Labs  Lab 08/22/17 0452  WBC 8.1  HGB 17.2  HCT 50.4  PLT 152   ------------------------------------------------------------------------------------------------------------------  Chemistries  Recent Labs  Lab 08/22/17 0452  NA 136  K 3.9  CL 102  CO2 25  GLUCOSE 270*  BUN 18  CREATININE 0.98  CALCIUM 8.8*   ------------------------------------------------------------------------------------------------------------------  Cardiac Enzymes Recent Labs  Lab 08/22/17 0149 08/23/17 1239  TROPONINI 0.08* 0.06*   ------------------------------------------------------------------------------------------------------------------  RADIOLOGY:  Ct Angio Chest Pe W Or Wo Contrast  Result Date: 08/23/2017 CLINICAL DATA:  Increased shortness of breath and chest tightness over the past several days. Evaluate pulmonary embolism. EXAM: CT ANGIOGRAPHY CHEST WITH CONTRAST TECHNIQUE: Multidetector CT imaging of the chest was performed using the standard protocol during bolus administration of intravenous contrast. Multiplanar CT image reconstructions and MIPs were obtained to evaluate the vascular anatomy. CONTRAST:  52mL ISOVUE-370 IOPAMIDOL (ISOVUE-370) INJECTION 76% COMPARISON:  Chest radiograph - 08/21/2017; chest CT - 01/28/2017 ; 03/20/2015 FINDINGS: Vascular Findings: There is adequate opacification of the pulmonary arterial system with the main pulmonary artery measuring 320 Hounsfield units. No discrete filling defects are seen within the pulmonary arterial tree to  suggest pulmonary embolism. Normal caliber of the main pulmonary artery. Cardiomegaly. Coronary artery calcifications. No pericardial effusion. Normal caliber the thoracic  aorta. Conventional configuration of the aortic arch. The branch vessels of the aortic arch appear patent throughout their imaged course. Review of the MIP images confirms the above findings. ---------------------------------------------------------------------------------- Nonvascular Findings: Mediastinum/Lymph Nodes: Scattered mediastinal lymph nodes are numerous though individually not enlarged by size criteria with index right paratracheal lymph node measuring 0.9 cm in greatest short axis diameter (image 24, series 4), index right paratracheal lymph node measuring 1 cm (image 33), index precarinal lymph node measuring 0.9 cm (image 36), index right suprahilar lymph node measuring 0.9 cm (image 41) and index prevascular lymph node measures 0.8 cm (image 33), improved compared to prior examinations. No bulky or worsening mediastinal, hilar or axillary lymphadenopathy. Lungs/Pleura: Evaluation of the pulmonary parenchyma is degraded secondary to patient respiratory artifact. Scattered ill-defined areas of ground-glass may represent air trapping. No discrete focal airspace opacities. No pleural effusion or pneumothorax. The central pulmonary airways appear widely patent No discrete pulmonary nodules given limitation of the examination. Upper abdomen: Limited early arterial phase evaluation of the upper abdomen demonstrates mild nodularity hepatic contour as could be seen in the setting of early cirrhotic change. Musculoskeletal: She no acute or aggressive osseous abnormalities. Stigmata of DISH within the thoracic spine. Regional soft tissues appear normal. Normal appearance of the thyroid gland. IMPRESSION: 1. Suspected mild airways disease. Otherwise, no explanation for patient's chest pain and shortness of breath. Specifically, no evidence of  pulmonary embolism or focal airspace opacities to suggest pneumonia. 2. Improved mediastinal and hilar adenopathy. 3. Mild nodularity hepatic contour could be indicative of early cirrhotic change. Further evaluation with LFTs could be performed as indicated. 4. Coronary artery calcifications. 5. Aortic Atherosclerosis (ICD10-I70.0). Electronically Signed   By: Sandi Mariscal M.D.   On: 08/23/2017 12:30    ASSESSMENT AND PLAN:   Active Problems:   Chest pain  Filbert Craze  is a 47 y.o. male with a known history of chronic chest pain, COPD, active smoker, CKD, hypertension, nonischemic cardiomyopathy with the systolic CHF, psoriasis comes from home secondary to worsening cough, chest pain and shortness of breath going on for a week now.  1. Chest pain-likely musculoskeletal pain or from bronchitis -Treat his cough, muscle relaxants for chest pain -Patient's troponin is elevated, stable troponins on follow ups -Just had a cardiac catheterization done in May 2018 with nonobstructive coronary artery disease. Monitor closely -Started on Z-Pak for his bronchitis symptoms - Encourage incentive spirometer. - On 08/23/2017 morning he had episode of chest tightness and dizziness after trying to walk in the room. I had seen twice since then and mainly this appears to be anxiety mediated as we had done a CT chest to rule out pulmonary embolism, state EKG and troponin and checked his vitals and blood sugar multiple times, including orthostatic. All of them are normal. Patient still continued to stay tachypneic and anxious, give small dose Xanax.  2. Elevated troponin-could be demand ischemia as mentioned above. Monitor on telemetry and recycle troponins. Called cardio consult due to his symptoms today.  3. Nonischemic cardiomyopathy-known systolic and diastolic dysfunction. EF of 35% -Continue cardiac medications. Patient on entresto, Aldactone, metoprolol and statin - Given IV lasix on admission, but no  signs of exacerbation, change back to oral torsemide. - We will give her IV Lasix today.  4. Diabetes mellitus-on metformin. Start sliding scale insulin.  5. DVT prophylaxis-on Lovenox  6. Tobacco use disorder-started on nicotine patch   Counseled for 4 min to quit.  All the records are reviewed and case discussed  with Care Management/Social Workerr. Management plans discussed with the patient, family and they are in agreement.  CODE STATUS: Full.  TOTAL TIME TAKING CARE OF THIS PATIENT: 35 minutes.   POSSIBLE D/C IN 1-2 DAYS, DEPENDING ON CLINICAL CONDITION.   Vaughan Basta M.D on 08/23/2017   Between 7am to 6pm - Pager - [phone]  After 6pm go to [email or web address] - password [email or web address] [facility]  Office  [phone]  CC: Primary care physician; Center, [facility]  Note: This dictation was prepared with Dragon dictation along with smaller phrase technology. Any transcriptional errors that result from this process are unintentional.

## 2017-08-23 NOTE — Plan of Care (Signed)
  Progressing Education: Knowledge of General Education information will improve 08/23/2017 0255 - Progressing by Loran Senters, RN Education: Understanding of cardiac disease, CV risk reduction, and recovery process will improve 08/23/2017 0255 - Progressing by Loran Senters, RN Activity: Ability to tolerate increased activity will improve 08/23/2017 0255 - Progressing by Loran Senters, RN Cardiac: Ability to achieve and maintain adequate cardiovascular perfusion will improve 08/23/2017 0255 - Progressing by Loran Senters, RN Health Behavior/Discharge Planning: Ability to safely manage health-related needs after discharge will improve 08/23/2017 0255 - Progressing by Loran Senters, RN

## 2017-08-24 DIAGNOSIS — G8929 Other chronic pain: Secondary | ICD-10-CM | POA: Diagnosis present

## 2017-08-24 DIAGNOSIS — R0789 Other chest pain: Secondary | ICD-10-CM | POA: Diagnosis present

## 2017-08-24 DIAGNOSIS — R079 Chest pain, unspecified: Secondary | ICD-10-CM | POA: Diagnosis present

## 2017-08-24 LAB — GLUCOSE, CAPILLARY: Glucose-Capillary: 209 mg/dL — ABNORMAL HIGH (ref 65–99)

## 2017-08-24 MED ORDER — NICOTINE 21 MG/24HR TD PT24: 21.0000 mg | MEDICATED_PATCH | Freq: Every day | TRANSDERMAL | 0 refills | Status: DC

## 2017-08-24 MED ORDER — HYDROCOD POLST-CPM POLST ER 10-8 MG/5ML PO SUER: 5.0000 mL | Freq: Two times a day (BID) | ORAL | 0 refills | Status: DC

## 2017-08-24 NOTE — Progress Notes (Signed)
Pt to be discharged today. Feeling "much better."  Iv and tele removed. disch instructions and prescrip given to him. disch to home accompanied by father.

## 2017-08-28 NOTE — Discharge Summary (Signed)
[facility] [place] NAME: Alvaro Aungst    MR#:  [id number]  DATE OF BIRTH:  03/03/1971  DATE OF ADMISSION:  08/21/2017 ADMITTING PHYSICIAN: Gladstone Lighter, MD  DATE OF DISCHARGE: 08/24/2017 12:00 PM  PRIMARY CARE PHYSICIAN: [facility] [place] DIAGNOSIS:  Chest pain, moderate coronary artery risk [R07.9] Upper respiratory tract infection, unspecified type [J06.9]  DISCHARGE DIAGNOSIS:  Active Problems:   Chronic combined systolic and diastolic heart failure (HCC)   Chest pain, moderate coronary artery risk   Dizziness   Chest pain   SECONDARY DIAGNOSIS:   Past Medical History:  Diagnosis Date  . Chest wall pain, chronic   . Chronic systolic CHF (congestive heart failure) ([place])    a. 03/2015 Echo: EF 45-50%; b. 12/2015 Echo: EF 20-25%; c. 02/2016 Echo: EF 30-35%; d. 11/2016 Echo: EF 40-45%.  . Chronic Troponin Elevation   . CKD (chronic kidney disease), stage III ([place])   . COPD (chronic obstructive pulmonary disease) ([place])   . Hypertension    Resolved since weight loss  . NICM (nonischemic cardiomyopathy) ([place])    a. 03/2015 Echo: EF 45-50%; b. 04/2015 MV: small defect of mild severity in apex 2/2 apical thinning, EF 30-44%;  c. 12/2015 Echo: EF 20-25%; d. 02/2016 Echo: EF 30-35%; e. 11/2016 Echo: EF 40-45%, diff HK, mildly dil Ao root, mild MR, mod dil LA;  f. 12/2016 Cath: LM nl, LAD/Diags/LCX/OMs min irregs, RCA 40p/m/d.  . Non-obstructive CAD (coronary artery disease)    a. 04/2015 low risk MV;  b. 12/2016 Cath: minor irregs in LAD/Diag/LCX/OM, RCA 40p/m/d.  [name] (nonsustained ventricular tachycardia) ([place])    a. 12/2015 noted on tele-->amio;  b. 12/2015 Event monitor: no VT. Atach noted.  . Obesity (BMI 30.0-34.9)   . Psoriasis   . Syncope    a. 01/2016 - felt to be vasovagal.  . Tobacco abuse     HOSPITAL COURSE:   JohnnieFlynnis a46 y.o.malewith a known history of chronic chest pain,  COPD, active smoker, CKD, hypertension, nonischemic cardiomyopathy with the systolic CHF, psoriasis comes from home secondary to worsening cough, chest pain and shortness of breath going on for a week now.  1. Chest pain-likely musculoskeletal pain or from bronchitis -Treat his cough, muscle relaxants for chest pain -Patient's troponin is elevated, stable troponins on follow ups -Just had a cardiac catheterization done in May 2018 with nonobstructive coronary artery disease. Monitor closely -Started on Z-Pak for his bronchitissymptoms - Encourage incentive spirometer. - On 08/23/2017 morning he had episode of chest tightness and dizziness after trying to walk in the room. I had seen twice since then and mainly this appears to be anxiety mediated as we had done a CT chest to rule out pulmonary embolism, state EKG and troponin and checked his vitals and blood sugar multiple times, including orthostatic. All of them are normal. Patient still continued to stay tachypneic and anxious, give small dose Xanax.  next day he felt better and no further cardiac work ups suggested by cardiologist, discharge.  2. Elevated troponin-could be demand ischemia as mentioned above. Monitor on telemetry and recycle troponins. Called cardio consult - no further work ups.  3. Nonischemic cardiomyopathy-known systolic and diastolic dysfunction. EF of 35% -Continue cardiac medications. Patient onentresto,Aldactone,metoprolol and statin - Given IV lasix on admission, but no signs of exacerbation, change back to oral torsemide.  4. Diabetes mellitus-on metformin. Start sliding scale insulin.  5. DVT prophylaxis-on Lovenox  6. Tobacco use disorder-started on nicotine patch   Counseled for 4 min to quit.   DISCHARGE CONDITIONS:   Stable.  CONSULTS OBTAINED:  Treatment Team:  Skeet Latch, MD  DRUG ALLERGIES:   Allergies  Allergen Reactions  . Prednisone Other (See Comments)    Reaction:  Hallucinations     DISCHARGE MEDICATIONS:   Allergies as of 08/24/2017      Reactions   Prednisone Other (See Comments)   Reaction: Hallucinations      Medication List    TAKE these medications   amiodarone 200 MG tablet Commonly known as:  PACERONE Take 200 mg by mouth daily.   amLODipine 5 MG tablet Commonly known as:  NORVASC Take 1 tablet (5 mg total) daily by mouth.   aspirin 81 MG chewable tablet Chew 1 tablet (81 mg total) by mouth daily. Reported on 02/08/2016   chlorpheniramine-HYDROcodone 10-8 MG/5ML Suer Commonly known as:  TUSSIONEX Take 5 mLs by mouth every 12 (twelve) hours.   DELSYM 30 MG/5ML liquid Generic drug:  dextromethorphan Take 15 mg by mouth as needed for cough.   metFORMIN 500 MG tablet Commonly known as:  GLUCOPHAGE Take 1 tablet by mouth 2 (two) times daily.   metoprolol succinate 50 MG 24 hr tablet Commonly known as:  TOPROL-XL Take 1 tablet (50 mg total) daily by mouth. Take with or immediately following a meal. What changed:  additional instructions   nicotine 21 mg/24hr patch Commonly known as:  NICODERM CQ - dosed in mg/24 hours Place 1 patch (21 mg total) onto the skin daily.   potassium chloride 10 MEQ tablet Commonly known as:  K-DUR Take 1 tablet (10 mEq total) daily by mouth.   ranitidine 150 MG tablet Commonly known as:  ZANTAC Take 1 tablet (150 mg total) by mouth 2 (two) times daily.   rosuvastatin 20 MG tablet Commonly known as:  CRESTOR Take 1 tablet (20 mg total) by mouth daily.   sacubitril-valsartan 49-51 MG Commonly known as:  ENTRESTO Take 1 tablet 2 (two) times daily by mouth.   spironolactone 25 MG tablet Commonly known as:  ALDACTONE Take 1 tablet (25 mg total) by mouth daily.   sucralfate 1 g tablet Commonly known as:  CARAFATE Take 1 tablet (1 g total) by mouth 4 (four) times daily.   torsemide 20 MG tablet Commonly known as:  DEMADEX Take 1 tablet (20 mg total) by mouth daily.         DISCHARGE INSTRUCTIONS:    Follow with PMD in 1-2 weeks.  If you experience worsening of your admission symptoms, develop shortness of breath, life threatening emergency, suicidal or homicidal thoughts you must seek medical attention immediately by calling 911 or calling your MD immediately  if symptoms less severe.  You Must read complete instructions/literature along with all the possible adverse reactions/side effects for all the Medicines you take and that have been prescribed to you. Take any new Medicines after you have completely understood and accept all the possible adverse reactions/side effects.   Please note  You were cared for by a hospitalist during your hospital stay. If you have any questions about your discharge medications or the care you received while you were in the hospital after you are discharged, you can call the unit and asked to speak with the hospitalist on call if the hospitalist that took care of you is not available. Once you are discharged, your primary care physician will handle any further medical issues. Please note  that NO REFILLS for any discharge medications will be authorized once you are discharged, as it is imperative that you return to your primary care physician (or establish a relationship with a primary care physician if you do not have one) for your aftercare needs so that they can reassess your need for medications and monitor your lab values.    Today   CHIEF COMPLAINT:   Chief Complaint  Patient presents with  . Chest Pain    HISTORY OF PRESENT ILLNESS:  Carsin Randazzo  is a 47 y.o. male with a known history of chronic chest pain, COPD, active smoker, CKD, hypertension, nonischemic cardiomyopathy with the systolic CHF, psoriasis comes from home secondary to worsening cough, chest pain and shortness of breath going on for a week now. Patient admitted with chest pain 6 months ago and was noted to have nonobstructive coronary artery  disease on cardiac catheterization. He had non-ischemic cardiomyopathy with EF of 30%, started on cardiac medications. He states he has been around people were sick with strep throat. Symptoms started about a week ago with on and off chest pain that has been consistent and worse since last night. Cough has been very dry but has significant muscle aches from coughing. Also complains of sore throat. Denies any fevers but complains of chills. No nausea or vomiting. He is not noted to be hypoxic or tachycardic today, however troponin is slightly elevated at 0.1 which is high from his baseline. Chest x-ray with no acute changes.    VITAL SIGNS:  Blood pressure (!) 154/95, pulse 75, temperature 97.9 F (36.6 C), temperature source Oral, resp. rate 16, height 6' (1.829 m), weight 115.7 kg (255 lb 1.6 oz), SpO2 95 %.  I/O:  No intake or output data in the 24 hours ending 08/28/17 1106  PHYSICAL EXAMINATION:  GENERAL:  47 y.o.-year-old patient lying in the bed with no acute distress.  EYES: Pupils equal, round, reactive to light and accommodation. No scleral icterus. Extraocular muscles intact.  HEENT: Head atraumatic, normocephalic. Oropharynx and nasopharynx clear.  NECK:  Supple, no jugular venous distention. No thyroid enlargement, no tenderness.  LUNGS: Normal breath sounds bilaterally, no wheezing, rales,rhonchi or crepitation. No use of accessory muscles of respiration.  CARDIOVASCULAR: S1, S2 normal. No murmurs, rubs, or gallops.  ABDOMEN: Soft, non-tender, non-distended. Bowel sounds present. No organomegaly or mass.  EXTREMITIES: No pedal edema, cyanosis, or clubbing.  NEUROLOGIC: Cranial nerves II through XII are intact. Muscle strength 5/5 in all extremities. Sensation intact. Gait not checked.  PSYCHIATRIC: The patient is alert and oriented x 3.  SKIN: No obvious rash, lesion, or ulcer.   DATA REVIEW:   CBC Recent Labs  Lab 08/22/17 0452  WBC 8.1  HGB 17.2  HCT 50.4  PLT 152     Chemistries  Recent Labs  Lab 08/22/17 0452  NA 136  K 3.9  CL 102  CO2 25  GLUCOSE 270*  BUN 18  CREATININE 0.98  CALCIUM 8.8*    Cardiac Enzymes Recent Labs  Lab 08/23/17 2000  TROPONINI 0.05*    Microbiology Results  Results for orders placed or performed during the hospital encounter of 08/21/17  Group A Strep by PCR ([place] Only)     Status: None   Collection Time: 08/21/17  9:25 AM  Result Value Ref Range Status   Group A Strep by PCR NOT DETECTED NOT DETECTED Final    Comment: Performed at [facility], [address], [place], [place] [address]  RADIOLOGY:  No results found.  EKG:   Orders placed or performed during the hospital encounter of 08/21/17  . EKG 12-Lead  . EKG 12-Lead  . EKG 12-Lead  . EKG 12-Lead  . EKG      Management plans discussed with the patient, family and they are in agreement.  CODE STATUS:  Code Status History    Date Active Date Inactive Code Status Order ID Comments User Context   08/21/2017 13:50 08/24/2017 16:15 Full Code [id number]  Gladstone Lighter, MD Inpatient   04/28/2017 16:53 04/29/2017 17:55 Full Code [id number]  Gladstone Lighter, MD Inpatient   01/05/2017 15:55 01/07/2017 16:43 Full Code [id number]  Nicholes Mango, MD ED   11/28/2016 13:42 11/29/2016 22:59 Full Code [id number]  Demetrios Loll, MD Inpatient   10/28/2016 13:58 10/30/2016 13:54 Full Code [id number]  Hillary Bow, MD ED   03/15/2016 07:58 03/16/2016 15:08 Full Code [id number]  Harrie Foreman, MD Inpatient   02/16/2016 02:24 02/17/2016 13:02 Full Code [id number]  Harrie Foreman, MD Inpatient   01/22/2016 23:51 01/23/2016 16:41 Full Code [id number]  Demetrios Loll, MD ED   01/03/2016 14:22 01/09/2016 14:54 Full Code [id number]  Lytle Butte, MD ED   05/17/2015 23:41 05/19/2015 17:31 Full Code [id number]  Lance Coon, MD Inpatient   03/20/2015 08:26 03/21/2015 15:27 Full Code [id number]  Harrie Foreman, MD Inpatient      TOTAL TIME TAKING CARE OF THIS PATIENT:  35 minutes.    Vaughan Basta M.D on 08/28/2017 at 11:06 AM  Between 7am to 6pm - Pager - [phone]  After 6pm go to [email or web address] - password [email or web address] [facility]  Office  [phone]  CC: Primary care physician; Center, [facility]   Note: This dictation was prepared with Dragon dictation along with smaller phrase technology. Any transcriptional errors that result from this process are unintentional.

## 2017-09-01 NOTE — Progress Notes (Signed)
Patient ID: Gabriel Mano., male    DOB: [birth year], 47 y.o.   MRN: [id number]  HPI  Gabriel [name] is a 47 y/o male with a history of HTN, diabetes, COPD, CKD, asthma, current tobacco use and chronic heart failure.   Most recent echo done 04/29/17 showed EF 45-50%. Echo report that was done 11/28/16 and it showed an EF of 40-45% along with mild Gabriel. EF has improved from 30-35% July 2017.  Admitted 08/21/17 due to chest pain. Cardiology consult obtained. Started on zpack due to bronchitis symptoms. Elevated troponin thought to be due to demand ischemia. Discharged after 3 days. Was in the ED 07/16/17 due to back pain. Treated and released. Was in the ED 06/30/17 due to HTN. Medication adjusted and he was released. Was in the ED 05/10/17 due to acute gastritis. Was treated and released. Was in [facility] ED 04/28/17 due to a near syncope episode. Noted to have acute renal failure on chronic renal insufficiency and dehydration. All BP meds were held and gentle hydration given. Was in the [facility] ED 02/05/17 due to chest pain. Evaluated and discharged home. Was in the ED 01/28/17 for chest pain. Chronically elevated troponins. Discharged home. Last admission on 01/05/17 for an NSTEMI. Had elevated troponin and underwent cardiac catheterization. Was discharged home after 2 days on nitroglycerin and Crestor. Was in the ED 12/06/16 for a kidney stone. Evaluated and discharged home. Admitted 11/28/16 with HF exacerbation. Cardiology consult obtained. IV solu-medrol given for COPD exacerbation as well. Discharged home the next day.   He presents today for a follow-up visit with a chief complaint of mild shortness of breath upon moderate exertion. He describes this as chronic in nature having been present for several years with varying levels of severity. He has associated fatigue, head congestion, cough, chronic back pain, numbness down both legs, difficulty sleeping and anxiety along with this. He denies any chest pain, edema,  palpitations, abdominal distention, dizziness or weight gain.   Past Medical History:  Diagnosis Date  . Chest wall pain, chronic   . Chronic systolic CHF (congestive heart failure) ([place])    a. 03/2015 Echo: EF 45-50%; b. 12/2015 Echo: EF 20-25%; c. 02/2016 Echo: EF 30-35%; d. 11/2016 Echo: EF 40-45%.  . Chronic Troponin Elevation   . CKD (chronic kidney disease), stage III ([place])   . COPD (chronic obstructive pulmonary disease) ([place])   . Hypertension    Resolved since weight loss  . NICM (nonischemic cardiomyopathy) ([place])    a. 03/2015 Echo: EF 45-50%; b. 04/2015 MV: small defect of mild severity in apex 2/2 apical thinning, EF 30-44%;  c. 12/2015 Echo: EF 20-25%; d. 02/2016 Echo: EF 30-35%; e. 11/2016 Echo: EF 40-45%, diff HK, mildly dil Ao root, mild Gabriel, mod dil LA;  f. 12/2016 Cath: LM nl, LAD/Diags/LCX/OMs min irregs, RCA 40p/m/d.  . Non-obstructive CAD (coronary artery disease)    a. 04/2015 low risk MV;  b. 12/2016 Cath: minor irregs in LAD/Diag/LCX/OM, RCA 40p/m/d.  [name] (nonsustained ventricular tachycardia) ([place])    a. 12/2015 noted on tele-->amio;  b. 12/2015 Event monitor: no VT. Atach noted.  . Obesity (BMI 30.0-34.9)   . Psoriasis   . Syncope    a. 01/2016 - felt to be vasovagal.  . Tobacco abuse    Past Surgical History:  Procedure Laterality Date  . AMPUTATION    . FINGER AMPUTATION     Traumatic  . LEFT HEART CATH AND CORONARY ANGIOGRAPHY N/A 01/06/2017  Procedure: Left Heart Cath and Coronary Angiography;  Surgeon: Wellington Hampshire, MD;  Location: [facility];  Service: Cardiovascular;  Laterality: N/A;   Family History  Problem Relation Age of Onset  . Diabetes Mellitus II Mother   . Hypertension Father   . Cancer Paternal Aunt   . Cancer Maternal Grandfather    Social History   Tobacco Use  . Smoking status: Current Some Day Smoker    Packs/day: 0.50    Years: 33.00    Pack years: 16.50    Types: Cigarettes  . Smokeless tobacco: Never Used  . Tobacco  comment: currently smoking a few cigs/day. But was smoking 2-3 packs per day up until last year for almost 30 years  Substance Use Topics  . Alcohol use: No    Alcohol/week: 1.8 oz    Types: 3 Cans of beer per week    Frequency: Never    Comment: occassionally   Allergies  Allergen Reactions  . Prednisone Other (See Comments)    Reaction: Hallucinations    Prior to Admission medications   Medication Sig Start Date End Date Taking? Authorizing Provider  amiodarone (PACERONE) 200 MG tablet Take 200 mg by mouth daily.   Yes [provider]  amLODipine (NORVASC) 5 MG tablet Take 1 tablet (5 mg total) daily by mouth. 06/30/17 06/30/18 Yes Eula Listen, MD  aspirin 81 MG chewable tablet Chew 1 tablet (81 mg total) by mouth daily. Reported on 02/08/2016 06/02/17  Yes [name] A, FNP  dextromethorphan (DELSYM) 30 MG/5ML liquid Take 15 mg by mouth as needed for cough.   Yes [provider]  metFORMIN (GLUCOPHAGE) 500 MG tablet Take 1 tablet by mouth 2 (two) times daily.   Yes [provider]  metoprolol succinate (TOPROL-XL) 50 MG 24 hr tablet Take 1 tablet (50 mg total) daily by mouth. Take with or immediately following a meal. Patient taking differently: Take 50 mg by mouth daily.  06/27/17 09/25/17 Yes Gollan, Kathlene November, MD  nicotine (NICODERM CQ - DOSED IN MG/24 HOURS) 21 mg/24hr patch Place 1 patch (21 mg total) onto the skin daily. 08/25/17  Yes Vaughan Basta, MD  potassium chloride (K-DUR) 10 MEQ tablet Take 1 tablet (10 mEq total) daily by mouth. 07/07/17  Yes Gollan, Kathlene November, MD  ranitidine (ZANTAC) 150 MG tablet Take 1 tablet (150 mg total) by mouth 2 (two) times daily. 06/04/17  Yes Hackney, Otila Kluver A, FNP  rosuvastatin (CRESTOR) 20 MG tablet Take 1 tablet (20 mg total) by mouth daily. 06/02/17 09/02/17 Yes Hackney, Tina A, FNP  sacubitril-valsartan (ENTRESTO) 49-51 MG Take 1 tablet 2 (two) times daily by mouth. 06/27/17  Yes Minna Merritts, MD   spironolactone (ALDACTONE) 25 MG tablet Take 1 tablet (25 mg total) by mouth daily. 06/02/17  Yes Hackney, Otila Kluver A, FNP  sucralfate (CARAFATE) 1 g tablet Take 1 tablet (1 g total) by mouth 4 (four) times daily. 06/02/17  Yes Hackney, Otila Kluver A, FNP  torsemide (DEMADEX) 20 MG tablet Take 1 tablet (20 mg total) by mouth daily. 06/02/17  Yes Alisa Graff, FNP    Review of Systems  Constitutional: Positive for fatigue. Negative for appetite change.  HENT: Positive for congestion. Negative for postnasal drip.   Eyes: Negative.   Respiratory: Positive for cough (chest sore from coughing) and shortness of breath. Negative for chest tightness.   Cardiovascular: Negative for chest pain, palpitations and leg swelling.  Gastrointestinal: Negative for abdominal distention and abdominal pain.  Endocrine: Negative.   Genitourinary: Negative.   Musculoskeletal: Positive for back pain. Negative for neck pain.  Skin: Negative.   Allergic/Immunologic: Negative.   Neurological: Positive for numbness (down both legs at times). Negative for dizziness and light-headedness.  Hematological: Negative for adenopathy. Does not bruise/bleed easily.  Psychiatric/Behavioral: Positive for sleep disturbance (due to pain down his legs). Negative for dysphoric mood and suicidal ideas. The patient is nervous/anxious (stress).    Vitals:   09/02/17 0852 09/02/17 0919  BP: (!) 157/107 (!) 140/92  Pulse: 80   Resp: 18   SpO2: 99%   Weight: 255 lb 4 oz (115.8 kg)   Height: 6' (1.829 m)    Wt Readings from Last 3 Encounters:  09/02/17 255 lb 4 oz (115.8 kg)  08/24/17 255 lb 1.6 oz (115.7 kg)  07/16/17 260 lb (117.9 kg)   Lab Results  Component Value Date   CREATININE 0.98 08/22/2017   CREATININE 1.00 08/21/2017   CREATININE 1.46 (H) 07/16/2017    Physical Exam  Constitutional: He is oriented to person, place, and time. He appears well-developed and well-nourished.  HENT:  Head: Normocephalic and atraumatic.   Neck: Normal range of motion. Neck supple. No JVD present.  Cardiovascular: Normal rate and regular rhythm.  Pulmonary/Chest: Effort normal. He has no wheezes. He has no rales. He exhibits no tenderness.  Abdominal: Soft. He exhibits no distension. There is no tenderness.  Musculoskeletal: He exhibits tenderness. He exhibits no edema.       Lumbar back: He exhibits decreased range of motion, tenderness and pain. He exhibits no swelling.  Neurological: He is alert and oriented to person, place, and time.  Skin: Skin is warm and dry.  Psychiatric: He has a normal mood and affect. His behavior is normal. Thought content normal.  Nursing note and vitals reviewed.   Assessment & Plan:  1: Chronic heart failure with reduced ejection fraction- - NYHA class II - euvolemic - weigh daily and call for an overnight weight gain of >2 pounds or a weekly weight gain of >5 pounds.  - weight slowly declining - not adding salt and he was encouraged to closely follow a 2000mg  sodium diet - he is going to medication management clinic today to pick up entresto - saw cardiologist Rockey Situ) 07/07/17 - BNP from 04/28/17 was 25.0 - PharmD went in and reviewed medications with patient  2: HTN- - BP elevated initially but then improved upon recheck - checking his BP at home and he says that it's fluctuating - sees his PCP [facility]) either later this week or next week - BMP from 08/22/17 reviewed and showed sodium 136, potassium 3.9 and GFR >60  3: Back pain- - this started months ago and has been chronic ever since. 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  - follow-up with PCP regarding treatment of this  4: Diabetes-   - does not check glucose as he does not have a glucometer; currently on metformin 500mg  twice daily -  A1c 9.2 % on  08/21/17 - fasting glucose in the office today was 271; last night ate soft tacos with refried beans and sour cream - to follow with PCP [facility]) - patient is requesting dietary teaching so a referral was made to the [facility]  5: Tobacco use- - continues to smoke "a few" cigarettes daily - complete  cessation discussed for 3 minutes with him  Patient did not bring his medications nor a list. Each medication was verbally reviewed with the patient and he was encouraged to bring the bottles to every visit to confirm accuracy of list.  Return in 3 months or sooner for any questions/problems before then.

## 2017-09-02 ENCOUNTER — Other Ambulatory Visit: Payer: Self-pay

## 2017-09-02 ENCOUNTER — Encounter: Payer: Self-pay | Admitting: Family

## 2017-09-02 ENCOUNTER — Ambulatory Visit: Payer: Self-pay | Attending: Family | Admitting: Family

## 2017-09-02 VITALS — BP 140/92 | HR 80 | Resp 18 | Ht 72.0 in | Wt 255.2 lb

## 2017-09-02 DIAGNOSIS — I130 Hypertensive heart and chronic kidney disease with heart failure and stage 1 through stage 4 chronic kidney disease, or unspecified chronic kidney disease: Secondary | ICD-10-CM | POA: Insufficient documentation

## 2017-09-02 DIAGNOSIS — I5022 Chronic systolic (congestive) heart failure: Secondary | ICD-10-CM | POA: Insufficient documentation

## 2017-09-02 DIAGNOSIS — I5043 Acute on chronic combined systolic (congestive) and diastolic (congestive) heart failure: Secondary | ICD-10-CM | POA: Insufficient documentation

## 2017-09-02 DIAGNOSIS — Z7982 Long term (current) use of aspirin: Secondary | ICD-10-CM | POA: Insufficient documentation

## 2017-09-02 DIAGNOSIS — R200 Anesthesia of skin: Secondary | ICD-10-CM | POA: Insufficient documentation

## 2017-09-02 DIAGNOSIS — I10 Essential (primary) hypertension: Secondary | ICD-10-CM

## 2017-09-02 DIAGNOSIS — Z720 Tobacco use: Secondary | ICD-10-CM

## 2017-09-02 DIAGNOSIS — Z7984 Long term (current) use of oral hypoglycemic drugs: Secondary | ICD-10-CM | POA: Insufficient documentation

## 2017-09-02 DIAGNOSIS — M5442 Lumbago with sciatica, left side: Secondary | ICD-10-CM

## 2017-09-02 DIAGNOSIS — G8929 Other chronic pain: Secondary | ICD-10-CM | POA: Insufficient documentation

## 2017-09-02 DIAGNOSIS — M545 Low back pain: Secondary | ICD-10-CM | POA: Insufficient documentation

## 2017-09-02 DIAGNOSIS — E669 Obesity, unspecified: Secondary | ICD-10-CM | POA: Insufficient documentation

## 2017-09-02 DIAGNOSIS — F419 Anxiety disorder, unspecified: Secondary | ICD-10-CM | POA: Insufficient documentation

## 2017-09-02 DIAGNOSIS — N183 Chronic kidney disease, stage 3 (moderate): Secondary | ICD-10-CM | POA: Insufficient documentation

## 2017-09-02 DIAGNOSIS — I2510 Atherosclerotic heart disease of native coronary artery without angina pectoris: Secondary | ICD-10-CM | POA: Insufficient documentation

## 2017-09-02 DIAGNOSIS — Z79899 Other long term (current) drug therapy: Secondary | ICD-10-CM | POA: Insufficient documentation

## 2017-09-02 DIAGNOSIS — M5441 Lumbago with sciatica, right side: Secondary | ICD-10-CM

## 2017-09-02 DIAGNOSIS — E119 Type 2 diabetes mellitus without complications: Secondary | ICD-10-CM

## 2017-09-02 DIAGNOSIS — F17210 Nicotine dependence, cigarettes, uncomplicated: Secondary | ICD-10-CM | POA: Insufficient documentation

## 2017-09-02 DIAGNOSIS — E1122 Type 2 diabetes mellitus with diabetic chronic kidney disease: Secondary | ICD-10-CM | POA: Insufficient documentation

## 2017-09-02 DIAGNOSIS — Z6830 Body mass index (BMI) 30.0-30.9, adult: Secondary | ICD-10-CM | POA: Insufficient documentation

## 2017-09-02 DIAGNOSIS — J449 Chronic obstructive pulmonary disease, unspecified: Secondary | ICD-10-CM | POA: Insufficient documentation

## 2017-09-02 HISTORY — DX: Acute on chronic combined systolic (congestive) and diastolic (congestive) heart failure: I50.43

## 2017-09-02 LAB — GLUCOSE, CAPILLARY: Glucose-Capillary: 271 mg/dL — ABNORMAL HIGH (ref 65–99)

## 2017-09-02 NOTE — Patient Instructions (Addendum)
Continue weighing daily and call for an overnight weight gain of > 2 pounds or a weekly weight gain of >5 pounds.    Smoking Cessation Quitting smoking is important to your health and has many advantages. However, it is not always easy to quit since nicotine is a very addictive drug. Oftentimes, people try 3 times or more before being able to quit. This document explains the best ways for you to prepare to quit smoking. Quitting takes hard work and a lot of effort, but you can do it. ADVANTAGES OF QUITTING SMOKING  You will live longer, feel better, and live better.  Your body will feel the impact of quitting smoking almost immediately.  Within 20 minutes, blood pressure decreases. Your pulse returns to its normal level.  After 8 hours, carbon monoxide levels in the blood return to normal. Your oxygen level increases.  After 24 hours, the chance of having a heart attack starts to decrease. Your breath, hair, and body stop smelling like smoke.  After 48 hours, damaged nerve endings begin to recover. Your sense of taste and smell improve.  After 72 hours, the body is virtually free of nicotine. Your bronchial tubes relax and breathing becomes easier.  After 2 to 12 weeks, lungs can hold more air. Exercise becomes easier and circulation improves.  The risk of having a heart attack, stroke, cancer, or lung disease is greatly reduced.  After 1 year, the risk of coronary heart disease is cut in half.  After 5 years, the risk of stroke falls to the same as a nonsmoker.  After 10 years, the risk of lung cancer is cut in half and the risk of other cancers decreases significantly.  After 15 years, the risk of coronary heart disease drops, usually to the level of a nonsmoker.  If you are pregnant, quitting smoking will improve your chances of having a healthy baby.  The people you live with, especially any children, will be healthier.  You will have extra money to spend on things other  than cigarettes. QUESTIONS TO THINK ABOUT BEFORE ATTEMPTING TO QUIT You may want to talk about your answers with your health care provider.  Why do you want to quit?  If you tried to quit in the past, what helped and what did not?  What will be the most difficult situations for you after you quit? How will you plan to handle them?  Who can help you through the tough times? Your family? Friends? A health care provider?  What pleasures do you get from smoking? What ways can you still get pleasure if you quit? Here are some questions to ask your health care provider:  How can you help me to be successful at quitting?  What medicine do you think would be best for me and how should I take it?  What should I do if I need more help?  What is smoking withdrawal like? How can I get information on withdrawal? GET READY  Set a quit date.  Change your environment by getting rid of all cigarettes, ashtrays, matches, and lighters in your home, car, or work. Do not let people smoke in your home.  Review your past attempts to quit. Think about what worked and what did not. GET SUPPORT AND ENCOURAGEMENT You have a better chance of being successful if you have help. You can get support in many ways.  Tell your family, friends, and coworkers that you are going to quit and need their support. Ask   them not to smoke around you.  Get individual, group, or telephone counseling and support. Programs are available at local hospitals and health centers. Call your local health department for information about programs in your area.  Spiritual beliefs and practices may help some smokers quit.  Download a "quit meter" on your computer to keep track of quit statistics, such as how long you have gone without smoking, cigarettes not smoked, and money saved.  Get a self-help book about quitting smoking and staying off tobacco. LEARN NEW SKILLS AND BEHAVIORS  Distract yourself from urges to smoke. Talk to  someone, go for a walk, or occupy your time with a task.  Change your normal routine. Take a different route to work. Drink tea instead of coffee. Eat breakfast in a different place.  Reduce your stress. Take a hot bath, exercise, or read a book.  Plan something enjoyable to do every day. Reward yourself for not smoking.  Explore interactive web-based programs that specialize in helping you quit. GET MEDICINE AND USE IT CORRECTLY Medicines can help you stop smoking and decrease the urge to smoke. Combining medicine with the above behavioral methods and support can greatly increase your chances of successfully quitting smoking.  Nicotine replacement therapy helps deliver nicotine to your body without the negative effects and risks of smoking. Nicotine replacement therapy includes nicotine gum, lozenges, inhalers, nasal sprays, and skin patches. Some may be available over-the-counter and others require a prescription.  Antidepressant medicine helps people abstain from smoking, but how this works is unknown. This medicine is available by prescription.  Nicotinic receptor partial agonist medicine simulates the effect of nicotine in your brain. This medicine is available by prescription. Ask your health care provider for advice about which medicines to use and how to use them based on your health history. Your health care provider will tell you what side effects to look out for if you choose to be on a medicine or therapy. Carefully read the information on the package. Do not use any other product containing nicotine while using a nicotine replacement product.  RELAPSE OR DIFFICULT SITUATIONS Most relapses occur within the first 3 months after quitting. Do not be discouraged if you start smoking again. Remember, most people try several times before finally quitting. You may have symptoms of withdrawal because your body is used to nicotine. You may crave cigarettes, be irritable, feel very hungry, cough  often, get headaches, or have difficulty concentrating. The withdrawal symptoms are only temporary. They are strongest when you first quit, but they will go away within 10-14 days. To reduce the chances of relapse, try to:  Avoid drinking alcohol. Drinking lowers your chances of successfully quitting.  Reduce the amount of caffeine you consume. Once you quit smoking, the amount of caffeine in your body increases and can give you symptoms, such as a rapid heartbeat, sweating, and anxiety.  Avoid smokers because they can make you want to smoke.  Do not let weight gain distract you. Many smokers will gain weight when they quit, usually less than 10 pounds. Eat a healthy diet and stay active. You can always lose the weight gained after you quit.  Find ways to improve your mood other than smoking. FOR MORE INFORMATION  www.smokefree.gov  Document Released: 07/30/2001 Document Revised: 12/20/2013 Document Reviewed: 11/14/2011 [employer] Patient Information 2015 [employer]. This information is not intended to replace advice given to you by your health care provider. Make sure you discuss any questions you have with your   health care provider.  

## 2017-09-06 IMAGING — CR DG CHEST 2V
1 series · 2 of 2 positions shown · non-contrast
Comparison: Chest CT dated 02/15/2016

CLINICAL DATA: 45-year-old male with left-sided chest pain and
shortness of breath

EXAM:
CHEST  2 VIEW

[Series 1: dg chest 2 view · 0.14mm/px · 2 of 2 slices shown]
[im 1/2]
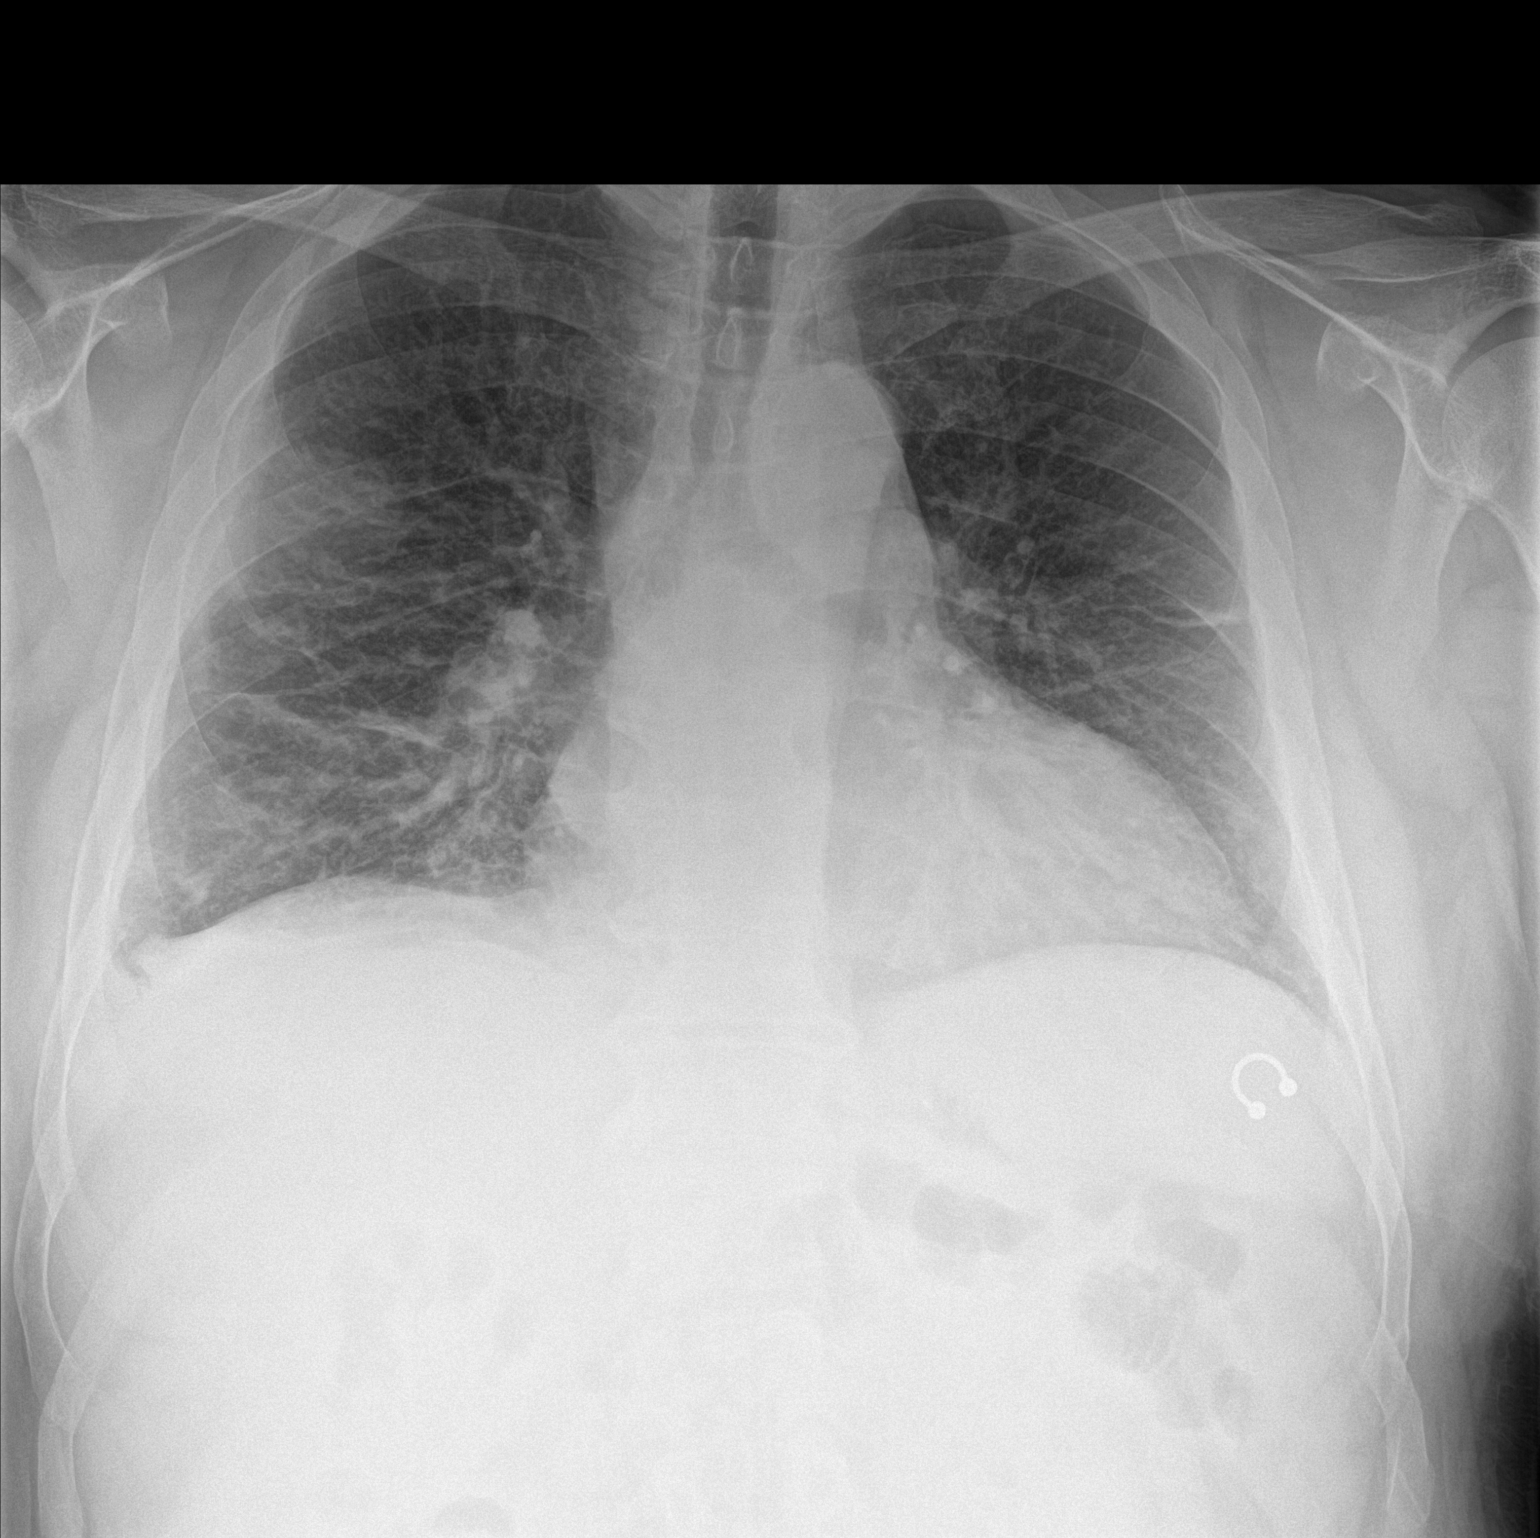
[im 2/2]
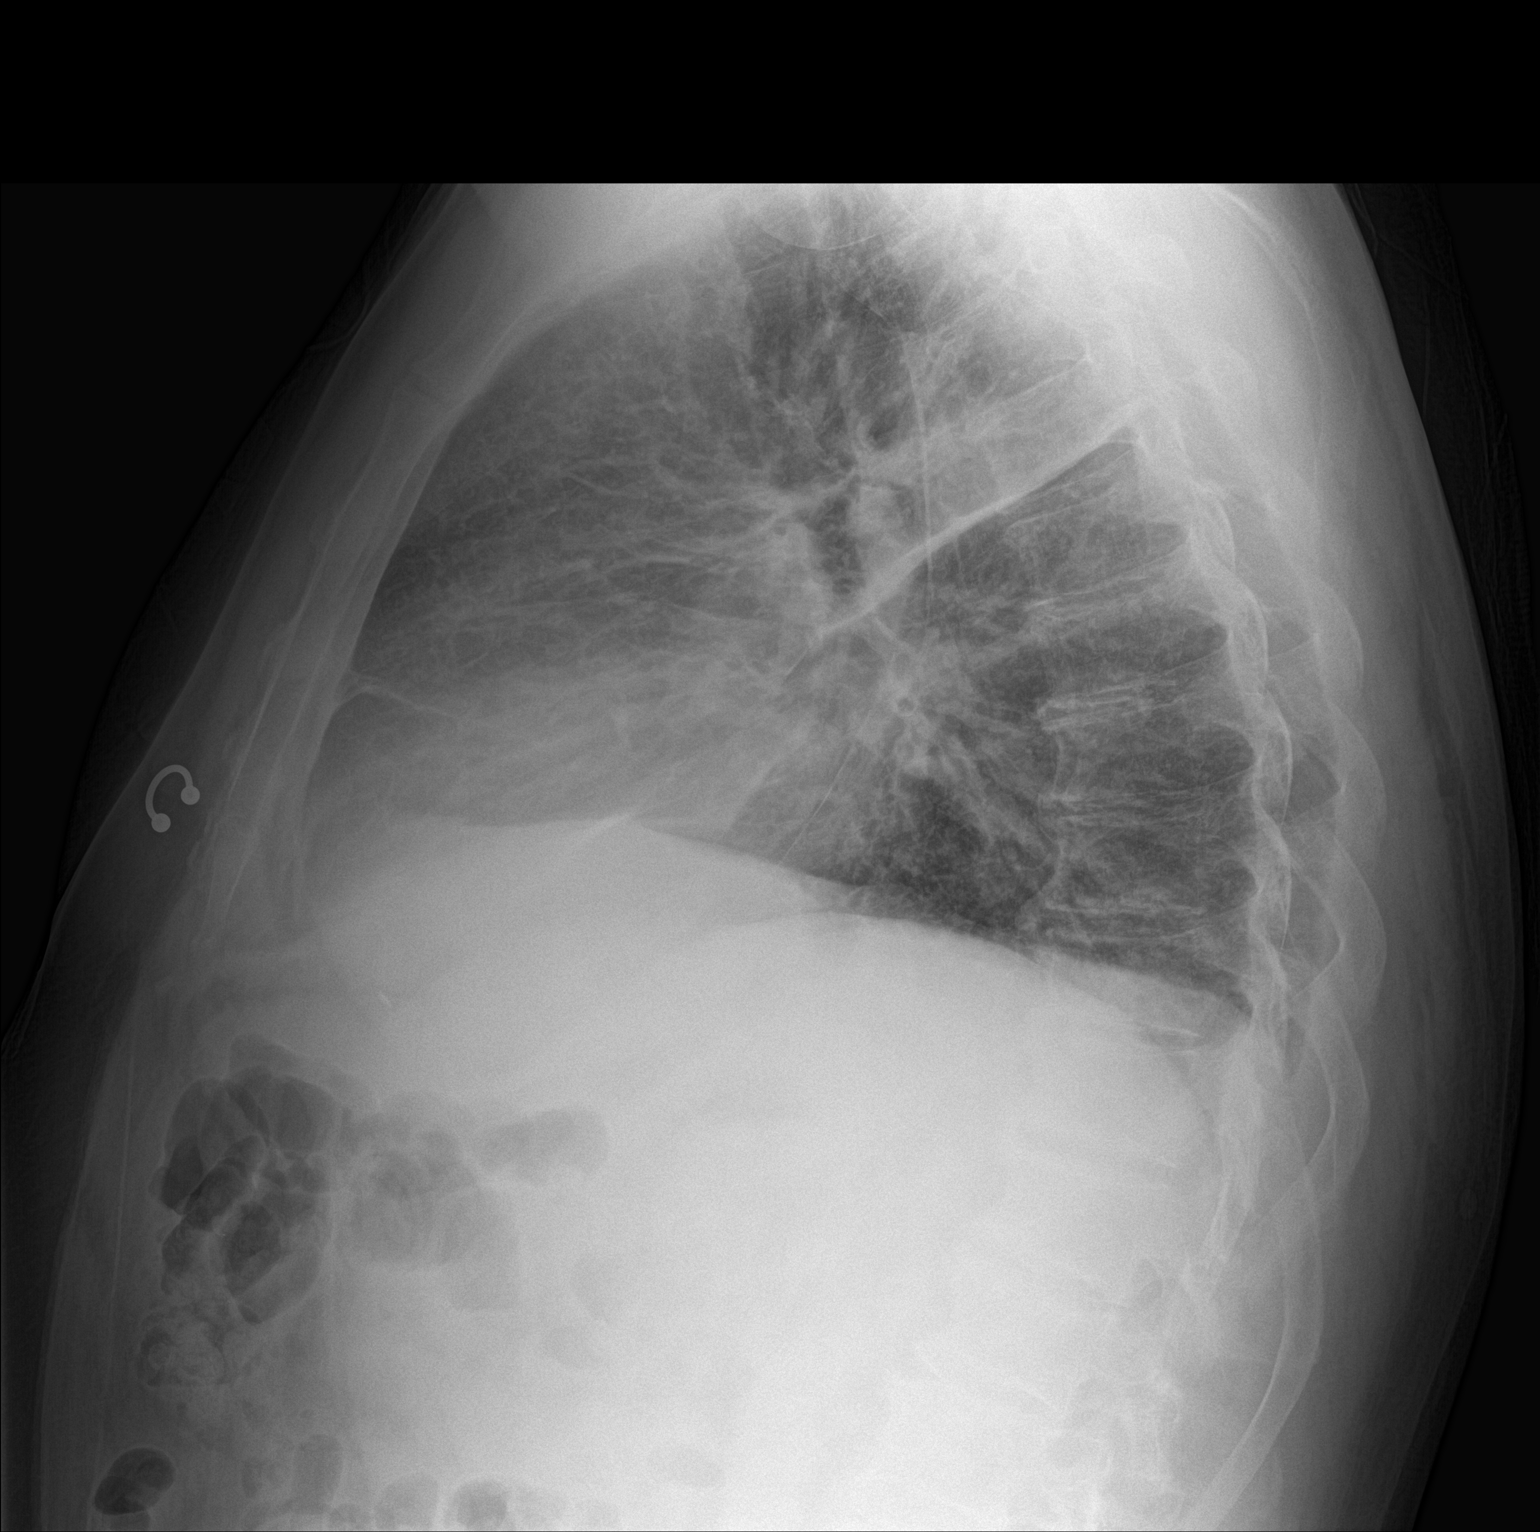

[2 of 2 positions shown; findings below may reference images not displayed]

FINDINGS: There is a focal area of pleural-based opacity in the right upper
lobe corresponding to the area of consolidation seen on the prior
CT. There has been minimal interval improvement compared to the
radiograph dated 02/15/2016. Is a small right pleural effusion.
There is diffuse interstitial prominence and edema. Mild
cardiomegaly. No acute osseous pathology.
IMPRESSION: Persistent focal opacity in the right upper lobe, minimally improved
since radiograph dated 02/15/2016.

Small right pleural effusion, new from radiographs dated 02/15/2016.
Follow-up to resolution recommended.

## 2017-09-12 ENCOUNTER — Encounter: Payer: Self-pay | Admitting: Family

## 2017-09-15 ENCOUNTER — Encounter: Payer: Self-pay | Attending: Family | Admitting: Dietician

## 2017-09-15 ENCOUNTER — Encounter: Payer: Self-pay | Admitting: Dietician

## 2017-09-15 VITALS — BP 186/116 | Ht 72.0 in | Wt 259.5 lb

## 2017-09-15 DIAGNOSIS — R809 Proteinuria, unspecified: Secondary | ICD-10-CM

## 2017-09-15 DIAGNOSIS — E1129 Type 2 diabetes mellitus with other diabetic kidney complication: Secondary | ICD-10-CM

## 2017-09-15 DIAGNOSIS — E119 Type 2 diabetes mellitus without complications: Secondary | ICD-10-CM | POA: Insufficient documentation

## 2017-09-15 NOTE — Progress Notes (Addendum)
Diabetes Self-Management Education  Visit Type: First/Initial  Appt. Start Time: 1030 Appt. End Time: 1130  09/15/2017  [name], identified by name and date of birth, is a 47 y.o. male with a diagnosis of Diabetes: Type 2.   ASSESSMENT  Blood pressure (!) 186/116 (BP rechecked 178/112), height 6' (1.829 m), weight 259 lb 8 oz (117.7 kg). Body mass index is 35.19 kg/m.  Smokes occasional cigarette PHONED [facility]'S OFFICE AND REPORTED BP-was advised that pt's BP usually runs high and she was not concerned about it  Diabetes Self-Management Education - 09/15/17 1308      Visit Information   Visit Type  First/Initial      Initial Visit   Diabetes Type  Type 2      Health Coping   How would you rate your overall health?  Poor      Psychosocial Assessment   Patient Belief/Attitude about Diabetes  Motivated to manage diabetes    Self-care barriers  None    Self-management support  Doctor's office    Other persons present  Patient    Patient Concerns  Weight Control;Healthy Lifestyle;Glycemic Control;Other (comment);Nutrition/Meal planning;Medication quit smoking, prevent complications    Special Needs  None    Preferred Learning Style  Auditory;Hands on    Learning Readiness  Ready    What is the last grade level you completed in school?  12      Pre-Education Assessment   Patient understands the diabetes disease and treatment process.  Needs Review    Patient understands incorporating nutritional management into lifestyle.  Needs Instruction    Patient undertands incorporating physical activity into lifestyle.  Needs Instruction    Patient understands using medications safely.  Needs Review    Patient understands monitoring blood glucose, interpreting and using results  Needs Instruction    Patient understands prevention, detection, and treatment of acute complications.  Needs Instruction    Patient understands prevention, detection, and treatment of chronic  complications.  Needs Instruction    Patient understands how to develop strategies to address psychosocial issues.  Needs Instruction    Patient understands how to develop strategies to promote health/change behavior.  Needs Instruction      Complications   Last HgB A1C per patient/outside source  9.2 % 08-21-17    How often do you check your blood sugar?  0 times/day (not testing)    Have you had a dilated eye exam in the past 12 months?  No 1996    Have you had a dental exam in the past 12 months?  No 1980's    Are you checking your feet?  No      Dietary Intake   Breakfast  east breakfast at 8a-usually eats cereal and lactose free milk or egg and toast    Snack (morning)  occasional handful of nuts, popcorn or pretzels    Lunch  eats lunch at 12p-eats salads, tuna and crackers, etc    Snack (afternoon)  occasional handful of nuts, popcorn or pretzels    Dinner  eats supper at 5p-meat and vegetables    Snack (evening)  none    Beverage(s)  drinks water 8+x/day and unsweet tea 8+x/day      Exercise   Exercise Type  ADL's limited due to constant back pain and CHF     Patient Education   Previous Diabetes Education  No    Disease state   Definition of diabetes, type 1 and 2, and the diagnosis  of diabetes;Explored patient's options for treatment of their diabetes    Nutrition management   Role of diet in the treatment of diabetes and the relationship between the three main macronutrients and blood glucose level;Food label reading, portion sizes and measuring food.;Carbohydrate counting    Physical activity and exercise   Role of exercise on diabetes management, blood pressure control and cardiac health.;Helped patient identify appropriate exercises in relation to his/her diabetes, diabetes complications and other health issue.    Medications  Reviewed patients medication for diabetes, action, purpose, timing of dose and side effects.    Monitoring  Taught/evaluated SMBG meter.;Purpose and  frequency of SMBG.;Taught/discussed recording of test results and interpretation of SMBG.;Identified appropriate SMBG and/or A1C goals.;Yearly dilated eye exam gave pt True Metrix meter with 60 test strips-instructed pt on use of meter; FBG 782    Chronic complications  Relationship between chronic complications and blood glucose control;Identified and discussed with patient  current chronic complications;Reviewed with patient heart disease, higher risk of, and prevention;Retinopathy and reason for yearly dilated eye exams;Dental care;Lipid levels, blood glucose control and heart disease;Nephropathy, what it is, prevention of, the use of ACE, ARB's and early detection of through urine microalbumia.    Psychosocial adjustment  Role of stress on diabetes discussed how pain can raise BG's    Personal strategies to promote health  Lifestyle issues that need to be addressed for better diabetes care;Review risk of smoking and offered smoking cessation;Helped patient develop diabetes management plan for (enter comment)      Outcomes   Expected Outcomes  Demonstrated interest in learning. Expect positive outcomes       Individualized Plan for Diabetes Self-Management Training:   Learning Objective:  Patient will have a greater understanding of diabetes self-management. Patient education plan is to attend individual and/or group sessions per assessed needs and concerns.   Plan:  Check blood sugars 2 x day before breakfast and 2 hrs after supper every day and record Bring blood sugar records to the next appointment/class Exercise: Try doing some exercises in Exercise and Physical Activity handbook if able and allowed by MD Eat 3 meals day,  1 snacks a day at bedtime Space meals 4-6 hours apart Eat 3-4 carbohydrate servings/meal + protein Eat 1 carbohydrate serving/snack + protein Avoid sugar sweetened drinks (soda, tea, coffee, sports drinks, juices) Limit intake of sweets and fried foods  Make  healthy food choices Quit  smoking Make  dentist / eye doctor appointments Get a Sharps container Return for appointment/classes on:  09-18-17  Expected Outcomes:  Demonstrated interest in learning. Expect positive outcomes  Education material provided: General meal planning guidelines, True Metrix meter with 60 test strips, Exercise and Physical Activity booklet, Smoking cessation class  handout  If problems or questions, patient to contact team via:  [phone]  Future DSME appointment:  09-18-17

## 2017-09-15 NOTE — Patient Instructions (Addendum)
  Check blood sugars 2 x day before breakfast and 2 hrs after supper every day and record Bring blood sugar records to the next appointment/class Exercise:  Try doing some exercises in Exercise and Physical Activity handbook if able Eat 3 meals day,  1 snacks a day at bedtime Space meals 4-6 hours apart Eat 3-4 carbohydrate servings/meal + protein Eat 1 carbohydrate serving/snack + protein Avoid sugar sweetened drinks (soda, tea, coffee, sports drinks, juices) Limit intake of sweets and fried foods  Make healthy food choices Quit / Decrease smoking Make  dentist / eye doctor appointments Get a Sharps container Return for appointment/classes on:  09-18-17

## 2017-09-18 ENCOUNTER — Encounter: Payer: Self-pay | Admitting: Dietician

## 2017-09-18 VITALS — Ht 72.0 in | Wt 259.5 lb

## 2017-09-18 DIAGNOSIS — E1129 Type 2 diabetes mellitus with other diabetic kidney complication: Secondary | ICD-10-CM

## 2017-09-18 DIAGNOSIS — R809 Proteinuria, unspecified: Principal | ICD-10-CM

## 2017-09-18 NOTE — Progress Notes (Signed)
Appt. Start Time: 0900 Appt. End Time: 1200  Class 1 Diabetes Overview - define DM; state own type of DM; identify functions of pancreas and insulin; define insulin deficiency vs insulin resistance  Psychosocial - identify DM as a source of stress; state the effects of stress on BG control  Nutritional Management - describe effects of food on blood glucose; identify sources of carbohydrate, protein and fat; verbalize the importance of balance meals in controlling blood glucose  Exercise - describe the effects of exercise on blood glucose and importance of regular exercise in controlling diabetes; state a plan for personal exercise; verbalize contraindications for exercise  Self-Monitoring - state importance of SMBG; use SMBG results to effectively manage diabetes; identify importance of regular HbA1C testing and goals for results  Acute Complications - recognize hyperglycemia and hypoglycemia with causes and effects; identify blood glucose results as high, low or in control; list steps in treating and preventing high and low blood glucose  Chronic Complications/Foot, Skin, Eye Dental Care - identify possible long-term complications of diabetes (retinopathy, neuropathy, nephropathy, cardiovascular disease, infections); state importance of daily self-foot exams; describe how to examine feet and what to look for; explain appropriate eye and dental care  Lifestyle Changes/Goals & Health/Community Resources - state benefits of making appropriate lifestyle changes; identify habits that need to change (meals, tobacco, alcohol); identify strategies to reduce risk factors for personal health  Pregnancy/Sexual Health - define gestational diabetes; state importance of good blood glucose control and birth control prior to pregnancy; state importance of good blood glucose control in preventing sexual problems (impotence, vaginal dryness, infections, loss of desire); state relationship of blood glucose control  and pregnancy outcome; describe risk of maternal and fetal complications  Teaching Materials Used: Class 1 Slides/Notebook Diabetes Booklet ID Card  Medic Alert/Medic ID Forms Sleep Evaluation Exercise Handout Planning a Balanced Meal Goals for Class 1  

## 2017-09-25 ENCOUNTER — Encounter: Payer: Self-pay | Attending: Family | Admitting: *Deleted

## 2017-09-25 ENCOUNTER — Encounter: Payer: Self-pay | Admitting: *Deleted

## 2017-09-25 VITALS — Wt 258.3 lb

## 2017-09-25 DIAGNOSIS — R809 Proteinuria, unspecified: Secondary | ICD-10-CM

## 2017-09-25 DIAGNOSIS — E1129 Type 2 diabetes mellitus with other diabetic kidney complication: Secondary | ICD-10-CM

## 2017-09-25 DIAGNOSIS — E119 Type 2 diabetes mellitus without complications: Secondary | ICD-10-CM | POA: Insufficient documentation

## 2017-09-25 NOTE — Progress Notes (Signed)
Appt. Start Time: 0900 Appt. End Time: 1200  Class 2 Nutritional Management - identify sources of carbohydrate, protein and fat; plan balanced meals; estimate servings of carbohydrates in meals  Psychosocial - identify DM as a source of stress; state the effects of stress on BG control  Exercise - describe the effects of exercise on blood glucose and importance of regular exercise in controlling diabetes; state a plan for personal exercise; verbalize contraindications for exercise  Self-Monitoring - state importance of SMBG; use SMBG results to effectively manage diabetes; identify importance of regular HbA1C testing and goals for results  Acute Complications - recognize hyperglycemia and hypoglycemia with causes and effects; identify blood glucose results as high, low or in control; list steps in treating and preventing high and low blood glucose  Sick Day Guidelines: state appropriate measure to manage blood glucose when ill (need for meds, HBGM plan, when to call physician, need for fluids)  Chronic Complications/Foot, Skin, Eye Dental Care - identify possible long-term complications of diabetes (retinopathy, neuropathy, nephropathy, cardiovascular disease, infections); explain steps in prevention and treatment of chronic complications; state importance of daily self-foot exams; describe how to examine feet and what to look for; explain appropriate eye and dental care  Lifestyle Changes/Goals - state benefits of making appropriate lifestyle changes; identify habits that need to change (meals, tobacco, alcohol); identify strategies to reduce risk factors for personal health  Pregnancy/Sexual Health - state importance of good blood glucose control in preventing sexual problems (impotence, vaginal dryness, infections, loss of desire)  Teaching Materials Used: Class 2 Slide Packet A1C Pamphlet Foot Care Literature Kidney Test Handout Stroke Card Quick and "Balanced" Meal Ideas Carb  Counting and Meal Planning Book Goals for Class 2 

## 2017-09-30 ENCOUNTER — Encounter: Payer: Self-pay | Admitting: Emergency Medicine

## 2017-09-30 ENCOUNTER — Other Ambulatory Visit: Payer: Self-pay

## 2017-09-30 ENCOUNTER — Emergency Department: Payer: Self-pay

## 2017-09-30 ENCOUNTER — Observation Stay
Admission: EM | Admit: 2017-09-30 | Discharge: 2017-10-01 | Disposition: A | Discharge status: Home / Self Care | Payer: Self-pay | Attending: Internal Medicine | Admitting: Internal Medicine

## 2017-09-30 DIAGNOSIS — I472 Ventricular tachycardia, unspecified: Secondary | ICD-10-CM

## 2017-09-30 DIAGNOSIS — E1122 Type 2 diabetes mellitus with diabetic chronic kidney disease: Secondary | ICD-10-CM | POA: Insufficient documentation

## 2017-09-30 DIAGNOSIS — I4729 Other ventricular tachycardia: Secondary | ICD-10-CM

## 2017-09-30 DIAGNOSIS — I25118 Atherosclerotic heart disease of native coronary artery with other forms of angina pectoris: Secondary | ICD-10-CM | POA: Diagnosis present

## 2017-09-30 DIAGNOSIS — R0789 Other chest pain: Principal | ICD-10-CM | POA: Diagnosis present

## 2017-09-30 DIAGNOSIS — I420 Dilated cardiomyopathy: Secondary | ICD-10-CM | POA: Insufficient documentation

## 2017-09-30 DIAGNOSIS — R748 Abnormal levels of other serum enzymes: Secondary | ICD-10-CM | POA: Insufficient documentation

## 2017-09-30 DIAGNOSIS — F17210 Nicotine dependence, cigarettes, uncomplicated: Secondary | ICD-10-CM | POA: Insufficient documentation

## 2017-09-30 DIAGNOSIS — E785 Hyperlipidemia, unspecified: Secondary | ICD-10-CM | POA: Insufficient documentation

## 2017-09-30 DIAGNOSIS — I2510 Atherosclerotic heart disease of native coronary artery without angina pectoris: Secondary | ICD-10-CM | POA: Diagnosis present

## 2017-09-30 DIAGNOSIS — Z79899 Other long term (current) drug therapy: Secondary | ICD-10-CM | POA: Insufficient documentation

## 2017-09-30 DIAGNOSIS — I130 Hypertensive heart and chronic kidney disease with heart failure and stage 1 through stage 4 chronic kidney disease, or unspecified chronic kidney disease: Secondary | ICD-10-CM | POA: Insufficient documentation

## 2017-09-30 DIAGNOSIS — R079 Chest pain, unspecified: Secondary | ICD-10-CM

## 2017-09-30 DIAGNOSIS — Z7982 Long term (current) use of aspirin: Secondary | ICD-10-CM | POA: Insufficient documentation

## 2017-09-30 DIAGNOSIS — E119 Type 2 diabetes mellitus without complications: Secondary | ICD-10-CM

## 2017-09-30 DIAGNOSIS — Z7984 Long term (current) use of oral hypoglycemic drugs: Secondary | ICD-10-CM | POA: Insufficient documentation

## 2017-09-30 DIAGNOSIS — D72829 Elevated white blood cell count, unspecified: Secondary | ICD-10-CM | POA: Insufficient documentation

## 2017-09-30 DIAGNOSIS — I10 Essential (primary) hypertension: Secondary | ICD-10-CM | POA: Diagnosis present

## 2017-09-30 DIAGNOSIS — N184 Chronic kidney disease, stage 4 (severe): Secondary | ICD-10-CM | POA: Insufficient documentation

## 2017-09-30 DIAGNOSIS — J449 Chronic obstructive pulmonary disease, unspecified: Secondary | ICD-10-CM | POA: Insufficient documentation

## 2017-09-30 DIAGNOSIS — Z89029 Acquired absence of unspecified finger(s): Secondary | ICD-10-CM | POA: Insufficient documentation

## 2017-09-30 DIAGNOSIS — J441 Chronic obstructive pulmonary disease with (acute) exacerbation: Secondary | ICD-10-CM | POA: Diagnosis present

## 2017-09-30 DIAGNOSIS — I5043 Acute on chronic combined systolic (congestive) and diastolic (congestive) heart failure: Secondary | ICD-10-CM | POA: Diagnosis present

## 2017-09-30 DIAGNOSIS — I5022 Chronic systolic (congestive) heart failure: Secondary | ICD-10-CM | POA: Insufficient documentation

## 2017-09-30 DIAGNOSIS — E782 Mixed hyperlipidemia: Secondary | ICD-10-CM | POA: Diagnosis present

## 2017-09-30 HISTORY — DX: Disorder of kidney and ureter, unspecified: N28.9

## 2017-09-30 LAB — CBC
HCT: 51.3 % (ref 40.0–52.0)
Hemoglobin: 17.8 g/dL (ref 13.0–18.0)
MCH: 29.5 pg (ref 26.0–34.0)
MCHC: 34.7 g/dL (ref 32.0–36.0)
MCV: 84.9 fL (ref 80.0–100.0)
Platelets: 212 10*3/uL (ref 150–440)
RBC: 6.04 MIL/uL — ABNORMAL HIGH (ref 4.40–5.90)
RDW: 14.5 % (ref 11.5–14.5)
WBC: 11.2 10*3/uL — ABNORMAL HIGH (ref 3.8–10.6)

## 2017-09-30 LAB — BASIC METABOLIC PANEL
Anion gap: 13 (ref 5–15)
BUN: 25 mg/dL — ABNORMAL HIGH (ref 6–20)
CO2: 22 mmol/L (ref 22–32)
Calcium: 9.7 mg/dL (ref 8.9–10.3)
Chloride: 98 mmol/L — ABNORMAL LOW (ref 101–111)
Creatinine, Ser: 1.66 mg/dL — ABNORMAL HIGH (ref 0.61–1.24)
GFR calc Af Amer: 55 mL/min — ABNORMAL LOW (ref >60)
GFR calc non Af Amer: 47 mL/min — ABNORMAL LOW (ref >60)
Glucose, Bld: 380 mg/dL — ABNORMAL HIGH (ref 65–99)
Potassium: 3.6 mmol/L (ref 3.5–5.1)
Sodium: 133 mmol/L — ABNORMAL LOW (ref 135–145)

## 2017-09-30 LAB — TROPONIN I
Troponin I: 0.04 ng/mL (ref <0.03)
Troponin I: 0.05 ng/mL (ref <0.03)

## 2017-09-30 MED ORDER — KETOROLAC TROMETHAMINE 30 MG/ML IJ SOLN: 30.0000 mg | Freq: Once | INTRAMUSCULAR | Status: DC

## 2017-09-30 MED ORDER — IOPAMIDOL (ISOVUE-300) INJECTION 61%
30.0000 mL | Freq: Once | INTRAVENOUS | Status: AC
  Administered 2017-09-30: 30 mL via ORAL

## 2017-09-30 MED ORDER — MORPHINE SULFATE (PF) 4 MG/ML IV SOLN
4.0000 mg | Freq: Once | INTRAVENOUS | Status: AC
  Administered 2017-09-30: 4 mg via INTRAVENOUS
  Filled 2017-09-30: qty 1

## 2017-09-30 MED ORDER — ONDANSETRON HCL 4 MG/2ML IJ SOLN
4.0000 mg | Freq: Once | INTRAMUSCULAR | Status: AC
  Administered 2017-09-30: 4 mg via INTRAVENOUS
  Filled 2017-09-30: qty 2

## 2017-09-30 MED ORDER — IOPAMIDOL (ISOVUE-300) INJECTION 61%
100.0000 mL | Freq: Once | INTRAVENOUS | Status: AC | PRN
  Administered 2017-09-30: 100 mL via INTRAVENOUS

## 2017-09-30 MED ORDER — MORPHINE SULFATE (PF) 2 MG/ML IV SOLN
2.0000 mg | INTRAVENOUS | Status: DC | PRN
  Administered 2017-10-01 (×3): 2 mg via INTRAVENOUS
  Filled 2017-09-30 (×2): qty 1

## 2017-09-30 MED ORDER — IPRATROPIUM-ALBUTEROL 0.5-2.5 (3) MG/3ML IN SOLN
3.0000 mL | Freq: Once | RESPIRATORY_TRACT | Status: AC
  Administered 2017-09-30: 3 mL via RESPIRATORY_TRACT
  Filled 2017-09-30: qty 3

## 2017-09-30 MED ORDER — ASPIRIN 81 MG PO CHEW
324.0000 mg | CHEWABLE_TABLET | Freq: Once | ORAL | Status: AC
  Administered 2017-09-30: 324 mg via ORAL
  Filled 2017-09-30: qty 4

## 2017-09-30 NOTE — ED Notes (Signed)
Pt c/o severe abd pain mainly to LUQ and LLQ that is sharp and stabbing.  Patient has severe cramping episodes when he is sat up.  MD aware.  Medications ordered.

## 2017-09-30 NOTE — ED Notes (Signed)
Again spoke with pt about wait times and what to expect next. Advised pt that I am available for further questions if needed.

## 2017-09-30 NOTE — ED Triage Notes (Signed)
Pt in via POV with complaints of generalized chest pain x 2 days, denies any associated symptoms.  Pt also reports cough, sore throat.  NAD noted at this time.

## 2017-09-30 NOTE — H&P (Signed)
[facility] NAME: Gabriel [name]    MR#:  [id number]  DATE OF BIRTH:  1970-08-31  DATE OF ADMISSION:  09/30/2017  PRIMARY CARE PHYSICIAN: [facility], [place]   REQUESTING/REFERRING PHYSICIAN: Cinda Quest, MD  CHIEF COMPLAINT:   Chief Complaint  Patient presents with  . Chest Pain    HISTORY OF PRESENT ILLNESS:  Gabriel [name]  is a 47 y.o. male who presents with chest pain, followed by development of abdominal pain while in the ED.  Workup showed mildly elevated troponin, patient has a history of mildly elevated troponin.  He does have significant cardiac history.  Hospitalist were called for admission and further evaluation.  Subsequent CT imaging of his abdomen and pelvis showed diverticulosis without diverticulitis.  PAST MEDICAL HISTORY:   Past Medical History:  Diagnosis Date  . Chest wall pain, chronic   . Chronic systolic CHF (congestive heart failure) ([place])    a. 03/2015 Echo: EF 45-50%; b. 12/2015 Echo: EF 20-25%; c. 02/2016 Echo: EF 30-35%; d. 11/2016 Echo: EF 40-45%.  . Chronic Troponin Elevation   . CKD (chronic kidney disease), stage III ([place])   . COPD (chronic obstructive pulmonary disease) ([place])   . Diabetes mellitus without complication ([place])   . Hypertension    Resolved since weight loss  . NICM (nonischemic cardiomyopathy) ([place])    a. 03/2015 Echo: EF 45-50%; b. 04/2015 MV: small defect of mild severity in apex 2/2 apical thinning, EF 30-44%;  c. 12/2015 Echo: EF 20-25%; d. 02/2016 Echo: EF 30-35%; e. 11/2016 Echo: EF 40-45%, diff HK, mildly dil Ao root, mild MR, mod dil LA;  f. 12/2016 Cath: LM nl, LAD/Diags/LCX/OMs min irregs, RCA 40p/m/d.  . Non-obstructive CAD (coronary artery disease)    a. 04/2015 low risk MV;  b. 12/2016 Cath: minor irregs in LAD/Diag/LCX/OM, RCA 40p/m/d.  [name] (nonsustained ventricular tachycardia) ([place])    a. 12/2015 noted on tele-->amio;  b. 12/2015 Event monitor: no VT. Atach  noted.  . Obesity (BMI 30.0-34.9)   . Psoriasis   . Renal insufficiency   . Syncope    a. 01/2016 - felt to be vasovagal.  . Tobacco abuse     PAST SURGICAL HISTORY:   Past Surgical History:  Procedure Laterality Date  . AMPUTATION    . CARDIAC CATHETERIZATION    . FINGER AMPUTATION     Traumatic  . LEFT HEART CATH AND CORONARY ANGIOGRAPHY N/A 01/06/2017   Procedure: Left Heart Cath and Coronary Angiography;  Surgeon: Wellington Hampshire, MD;  Location: [facility];  Service: Cardiovascular;  Laterality: N/A;    SOCIAL HISTORY:   Social History   Tobacco Use  . Smoking status: Current Some Day Smoker    Years: 33.00    Types: Cigarettes  . Smokeless tobacco: Never Used  . Tobacco comment: one cigarette in the past week  Substance Use Topics  . Alcohol use: No    Alcohol/week: 0.0 oz    Frequency: Never    Comment: occassionally    FAMILY HISTORY:   Family History  Problem Relation Age of Onset  . Diabetes Mellitus II Mother   . Hypertension Father   . Cancer Paternal Aunt   . Cancer Maternal Grandfather     DRUG ALLERGIES:   Allergies  Allergen Reactions  . Prednisone Other (See Comments)    Reaction: Hallucinations     MEDICATIONS AT HOME:   Prior to Admission medications  Medication Sig Start Date End Date Taking? Authorizing Provider  amiodarone (PACERONE) 200 MG tablet Take 200 mg by mouth daily.    [provider]  amLODipine (NORVASC) 5 MG tablet Take 1 tablet (5 mg total) daily by mouth. 06/30/17 06/30/18  Eula Listen, MD  aspirin 81 MG chewable tablet Chew 1 tablet (81 mg total) by mouth daily. Reported on 02/08/2016 06/02/17   Alisa Graff, FNP  dextromethorphan (DELSYM) 30 MG/5ML liquid Take 15 mg by mouth as needed for cough.    [provider]  metFORMIN (GLUCOPHAGE) 500 MG tablet Take 1 tablet by mouth 2 (two) times daily.    [provider]  metoprolol succinate (TOPROL-XL) 50 MG 24 hr tablet  Take 1 tablet (50 mg total) daily by mouth. Take with or immediately following a meal. Patient taking differently: Take 50 mg by mouth daily.  06/27/17 09/25/17  Minna Merritts, MD  nicotine (NICODERM CQ - DOSED IN MG/24 HOURS) 21 mg/24hr patch Place 1 patch (21 mg total) onto the skin daily. Patient not taking: Reported on 09/15/2017 08/25/17   Vaughan Basta, MD  potassium chloride (K-DUR) 10 MEQ tablet Take 1 tablet (10 mEq total) daily by mouth. 07/07/17   Gollan, Kathlene November, MD  ranitidine (ZANTAC) 150 MG tablet Take 1 tablet (150 mg total) by mouth 2 (two) times daily. 06/04/17   Alisa Graff, FNP  rosuvastatin (CRESTOR) 20 MG tablet Take 1 tablet (20 mg total) by mouth daily. 06/02/17 09/15/17  Alisa Graff, FNP  sacubitril-valsartan (ENTRESTO) 49-51 MG Take 1 tablet 2 (two) times daily by mouth. 06/27/17   Minna Merritts, MD  spironolactone (ALDACTONE) 25 MG tablet Take 1 tablet (25 mg total) by mouth daily. 06/02/17   Alisa Graff, FNP  sucralfate (CARAFATE) 1 g tablet Take 1 tablet (1 g total) by mouth 4 (four) times daily. Patient not taking: Reported on 09/15/2017 06/02/17   Alisa Graff, FNP  torsemide (DEMADEX) 20 MG tablet Take 1 tablet (20 mg total) by mouth daily. 06/02/17   Alisa Graff, FNP    REVIEW OF SYSTEMS:  Review of Systems  Constitutional: Negative for chills, fever, malaise/fatigue and weight loss.  HENT: Negative for ear pain, hearing loss and tinnitus.   Eyes: Negative for blurred vision, double vision, pain and redness.  Respiratory: Negative for cough, hemoptysis and shortness of breath.   Cardiovascular: Positive for chest pain. Negative for palpitations, orthopnea and leg swelling.  Gastrointestinal: Positive for abdominal pain. Negative for constipation, diarrhea, nausea and vomiting.  Genitourinary: Negative for dysuria, frequency and hematuria.  Musculoskeletal: Negative for back pain, joint pain and neck pain.  Skin:       No acne,  rash, or lesions  Neurological: Negative for dizziness, tremors, focal weakness and weakness.  Endo/Heme/Allergies: Negative for polydipsia. Does not bruise/bleed easily.  Psychiatric/Behavioral: Negative for depression. The patient is not nervous/anxious and does not have insomnia.      VITAL SIGNS:   Vitals:   09/30/17 2030 09/30/17 2130 09/30/17 2230 09/30/17 2300  BP: 120/82 (!) 134/92 127/89 (!) 142/104  Pulse: 90 94 89 88  Resp: (!) 21 20 20  (!) 21  Temp:      TempSrc:      SpO2: 93% 96% 96% 90%  Weight:      Height:       Wt Readings from Last 3 Encounters:  09/30/17 118.4 kg (261 lb)  09/25/17 117.2 kg (258 lb 4.8 oz)  09/18/17  117.7 kg (259 lb 8 oz)    PHYSICAL EXAMINATION:  Physical Exam  Vitals reviewed. Constitutional: He is oriented to person, place, and time. He appears well-developed and well-nourished. No distress.  HENT:  Head: Normocephalic and atraumatic.  Mouth/Throat: Oropharynx is clear and moist.  Eyes: Conjunctivae and EOM are normal. Pupils are equal, round, and reactive to light. No scleral icterus.  Neck: Normal range of motion. Neck supple. No JVD present. No thyromegaly present.  Cardiovascular: Normal rate, regular rhythm and intact distal pulses. Exam reveals no gallop and no friction rub.  No murmur heard. Respiratory: Effort normal and breath sounds normal. No respiratory distress. He has no wheezes. He has no rales.  GI: Soft. He exhibits no distension. There is tenderness (Left lower quadrant).  Mildly hyperactive bowel sounds  Musculoskeletal: Normal range of motion. He exhibits no edema.  No arthritis, no gout  Lymphadenopathy:    He has no cervical adenopathy.  Neurological: He is alert and oriented to person, place, and time. No cranial nerve deficit.  No dysarthria, no aphasia  Skin: Skin is warm and dry. No rash noted. No erythema.  Psychiatric: He has a normal mood and affect. His behavior is normal. Judgment and thought content  normal.    LABORATORY PANEL:   CBC Recent Labs  Lab 09/30/17 1702  WBC 11.2*  HGB 17.8  HCT 51.3  PLT 212   ------------------------------------------------------------------------------------------------------------------  Chemistries  Recent Labs  Lab 09/30/17 1702  NA 133*  K 3.6  CL 98*  CO2 22  GLUCOSE 380*  BUN 25*  CREATININE 1.66*  CALCIUM 9.7   ------------------------------------------------------------------------------------------------------------------  Cardiac Enzymes Recent Labs  Lab 09/30/17 2105  TROPONINI 0.05*   ------------------------------------------------------------------------------------------------------------------  RADIOLOGY:  Dg Chest 2 View  Result Date: 09/30/2017 CLINICAL DATA:  Chest pain, cough, sore throat, and shortness of breath for 2 days, history CHF, COPD, hypertension, diabetes mellitus, smoker EXAM: CHEST  2 VIEW COMPARISON:  08/21/2017 FINDINGS: Upper normal size of cardiac silhouette. Mediastinal contours and pulmonary vascularity normal. Chronic bronchitic changes. No acute infiltrate, pleural effusion or pneumothorax. Bones unremarkable. IMPRESSION: Chronic bronchitic changes. No acute abnormalities. Electronically Signed   By: Lavonia Dana M.D.   On: 09/30/2017 17:36    EKG:   Orders placed or performed during the hospital encounter of 09/30/17  . ED EKG within 10 minutes  . ED EKG within 10 minutes    IMPRESSION AND PLAN:  Principal Problem:   Atypical chest pain - chest pain is atypical, troponin is mildly elevated though seems to be chronically so, chest pain resolved in ED, though patient does have significant risk factors with known significant cardiac disease.  Trend enzymes tonight, get echo and cardiology consult in AM Active Problems:   Abdominal pain - CT showed diverticulosis without diverticulitis, prn symptom control   Chronic systolic heart failure ([place]) - home meds, other workup as above   HTN  (hypertension) -continue home medications   Diabetes ([place]) -sliding scale insulin with corresponding glucose checks   COPD (chronic obstructive pulmonary disease) ([facility]) -home dose inhalers   Hyperlipidemia -home dose statin  All the records are reviewed and case discussed with ED provider. Management plans discussed with the patient and/or family.  DVT PROPHYLAXIS: SubQ lovenox  GI PROPHYLAXIS: None  ADMISSION STATUS: Observation  CODE STATUS: Full Code Status History    Date Active Date Inactive Code Status Order ID Comments User Context   08/21/2017 13:50 08/24/2017 16:15 Full Code [id number]  Gladstone Lighter, MD  Inpatient   04/28/2017 16:53 04/29/2017 17:55 Full Code [id number]  Gladstone Lighter, MD Inpatient   01/05/2017 15:55 01/07/2017 16:43 Full Code [id number]  Nicholes Mango, MD ED   11/28/2016 13:42 11/29/2016 22:59 Full Code [id number]  Demetrios Loll, MD Inpatient   10/28/2016 13:58 10/30/2016 13:54 Full Code [id number]  Hillary Bow, MD ED   03/15/2016 07:58 03/16/2016 15:08 Full Code [id number]  Harrie Foreman, MD Inpatient   02/16/2016 02:24 02/17/2016 13:02 Full Code [id number]  Harrie Foreman, MD Inpatient   01/22/2016 23:51 01/23/2016 16:41 Full Code [id number]  Demetrios Loll, MD ED   01/03/2016 14:22 01/09/2016 14:54 Full Code [id number]  Lytle Butte, MD ED   05/17/2015 23:41 05/19/2015 17:31 Full Code [id number]  Lance Coon, MD Inpatient   03/20/2015 08:26 03/21/2015 15:27 Full Code [id number]  Harrie Foreman, MD Inpatient      TOTAL TIME TAKING CARE OF THIS PATIENT: 40 minutes.   Avielle Imbert Holland 09/30/2017, 11:27 PM  [facility]  Office  [phone]  CC: Primary care physician; [place]  Note:  This document was prepared using Systems analyst and may include unintentional dictation errors.

## 2017-09-30 NOTE — ED Notes (Addendum)
Spoke with pt about wait times and what to expect next. Advised pt that I am available for further questions if needed. Pt is being very polite and calm.

## 2017-09-30 NOTE — ED Notes (Signed)
Cardiac changes shown on monitor.  Strip printed out and given to Dr. Cinda Quest.

## 2017-09-30 NOTE — ED Notes (Signed)
Pt to room 18 via w/c with no distress noted; placed in hosp gown & on card monitor for further evaluation; report given to care nurse Annie Main, RN

## 2017-09-30 NOTE — ED Provider Notes (Addendum)
[facility] Emergency Department Provider Note   ____________________________________________   First MD Initiated Contact with Patient 09/30/17 2033     (approximate)  I have reviewed the triage vital signs and the nursing notes.   HISTORY  Chief Complaint Chest Pain   HPI Gabriel [name]. is a 47 y.o. male Patient reports pain across his chest for 2 days. He has a cough as well. He told the nurse she had a sore throat he did not tell me that. He has not been running a fever he's chronically achy from his hips up to his chest that has not changed at all his arms and legs are not hurting at all. He has a dry cough. His blood pressure is been up at home the last couple days. His chest pain is made worse by breathing and by moving and by exerting himself. He does feel somewhat short of breath. He is not nauseated.   Past Medical History:  Diagnosis Date  . Chest wall pain, chronic   . Chronic systolic CHF (congestive heart failure) ([place])    a. 03/2015 Echo: EF 45-50%; b. 12/2015 Echo: EF 20-25%; c. 02/2016 Echo: EF 30-35%; d. 11/2016 Echo: EF 40-45%.  . Chronic Troponin Elevation   . CKD (chronic kidney disease), stage III ([place])   . COPD (chronic obstructive pulmonary disease) ([place])   . Hypertension    Resolved since weight loss  . NICM (nonischemic cardiomyopathy) ([place])    a. 03/2015 Echo: EF 45-50%; b. 04/2015 MV: small defect of mild severity in apex 2/2 apical thinning, EF 30-44%;  c. 12/2015 Echo: EF 20-25%; d. 02/2016 Echo: EF 30-35%; e. 11/2016 Echo: EF 40-45%, diff HK, mildly dil Ao root, mild MR, mod dil LA;  f. 12/2016 Cath: LM nl, LAD/Diags/LCX/OMs min irregs, RCA 40p/m/d.  . Non-obstructive CAD (coronary artery disease)    a. 04/2015 low risk MV;  b. 12/2016 Cath: minor irregs in LAD/Diag/LCX/OM, RCA 40p/m/d.  [name] (nonsustained ventricular tachycardia) ([place])    a. 12/2015 noted on tele-->amio;  b. 12/2015 Event monitor: no VT. Atach noted.  . Obesity  (BMI 30.0-34.9)   . Psoriasis   . Syncope    a. 01/2016 - felt to be vasovagal.  . Tobacco abuse     Patient Active Problem List   Diagnosis Date Noted  . Chronic systolic heart failure ([place]) 09/02/2017  . Chest pain 08/24/2017  . Dizziness   . Noncompliance with medications 04/29/2017  . Back pain 03/06/2017  . Tobacco use 12/04/2016  . Gallbladder sludge 12/04/2016  . Coronary artery disease, non-occlusive 03/15/2016  . Atypical chest pain 02/16/2016  . Orthostatic hypotension 01/23/2016  . Hypokalemia 01/23/2016  . Hypomagnesemia 01/23/2016  . Congestive dilated cardiomyopathy ([place]) 01/17/2016  . NSVT (nonsustained ventricular tachycardia) ([place])   . Elevated troponin   . Sepsis ([place]) 01/03/2016  . Diabetes ([place]) 05/17/2015  . Hyperlipidemia 05/17/2015  . COPD (chronic obstructive pulmonary disease) ([place]) 05/17/2015  . HTN (hypertension) 03/31/2015    Past Surgical History:  Procedure Laterality Date  . AMPUTATION    . CARDIAC CATHETERIZATION    . FINGER AMPUTATION     Traumatic  . LEFT HEART CATH AND CORONARY ANGIOGRAPHY N/A 01/06/2017   Procedure: Left Heart Cath and Coronary Angiography;  Surgeon: Wellington Hampshire, MD;  Location: [facility];  Service: Cardiovascular;  Laterality: N/A;    Prior to Admission medications   Medication Sig Start Date End Date Taking? Authorizing Provider  amiodarone (PACERONE) 200 MG tablet Take 200 mg by mouth daily.    [provider]  amLODipine (NORVASC) 5 MG tablet Take 1 tablet (5 mg total) daily by mouth. 06/30/17 06/30/18  Eula Listen, MD  aspirin 81 MG chewable tablet Chew 1 tablet (81 mg total) by mouth daily. Reported on 02/08/2016 06/02/17   Alisa Graff, FNP  dextromethorphan (DELSYM) 30 MG/5ML liquid Take 15 mg by mouth as needed for cough.    [provider]  metFORMIN (GLUCOPHAGE) 500 MG tablet Take 1 tablet by mouth 2 (two) times daily.    [provider]  metoprolol  succinate (TOPROL-XL) 50 MG 24 hr tablet Take 1 tablet (50 mg total) daily by mouth. Take with or immediately following a meal. Patient taking differently: Take 50 mg by mouth daily.  06/27/17 09/25/17  Minna Merritts, MD  nicotine (NICODERM CQ - DOSED IN MG/24 HOURS) 21 mg/24hr patch Place 1 patch (21 mg total) onto the skin daily. Patient not taking: Reported on 09/15/2017 08/25/17   Vaughan Basta, MD  potassium chloride (K-DUR) 10 MEQ tablet Take 1 tablet (10 mEq total) daily by mouth. 07/07/17   Gollan, Kathlene November, MD  ranitidine (ZANTAC) 150 MG tablet Take 1 tablet (150 mg total) by mouth 2 (two) times daily. 06/04/17   Alisa Graff, FNP  rosuvastatin (CRESTOR) 20 MG tablet Take 1 tablet (20 mg total) by mouth daily. 06/02/17 09/15/17  Alisa Graff, FNP  sacubitril-valsartan (ENTRESTO) 49-51 MG Take 1 tablet 2 (two) times daily by mouth. 06/27/17   Minna Merritts, MD  spironolactone (ALDACTONE) 25 MG tablet Take 1 tablet (25 mg total) by mouth daily. 06/02/17   Alisa Graff, FNP  sucralfate (CARAFATE) 1 g tablet Take 1 tablet (1 g total) by mouth 4 (four) times daily. Patient not taking: Reported on 09/15/2017 06/02/17   Alisa Graff, FNP  torsemide (DEMADEX) 20 MG tablet Take 1 tablet (20 mg total) by mouth daily. 06/02/17   Alisa Graff, FNP    Allergies Prednisone  Family History  Problem Relation Age of Onset  . Diabetes Mellitus II Mother   . Hypertension Father   . Cancer Paternal Aunt   . Cancer Maternal Grandfather     Social History Social History   Tobacco Use  . Smoking status: Current Some Day Smoker    Years: 33.00    Types: Cigarettes  . Smokeless tobacco: Never Used  . Tobacco comment: one cigarette in the past week  Substance Use Topics  . Alcohol use: No    Alcohol/week: 0.0 oz    Frequency: Never    Comment: occassionally  . Drug use: No    Review of Systems  Constitutional: No fever/chills Eyes: No visual changes. ENT: No sore  throat. Cardiovascular:  chest pain. Respiratory:  shortness of breath. Gastrointestinal: No abdominal pain.  No nausea, no vomiting.  No diarrhea.  No constipation. Genitourinary: Negative for dysuria. Musculoskeletal: Negative for back pain. Skin: Negative for rash. Neurological: Negative for headaches, focal weakness   ____________________________________________   PHYSICAL EXAM:  VITAL SIGNS: ED Triage Vitals  Enc Vitals Group     BP 09/30/17 1704 (!) 134/101     Pulse Rate 09/30/17 1704 (!) 108     Resp 09/30/17 2029 (!) 22     Temp 09/30/17 1704 98.4 F (36.9 C)     Temp Source 09/30/17 1704 Oral     SpO2 09/30/17 1704 95 %  Weight 09/30/17 1701 261 lb (118.4 kg)     Height 09/30/17 1701 6' (1.829 m)     Head Circumference --      Peak Flow --      Pain Score 09/30/17 1701 10     Pain Loc --      Pain Edu? --      Excl. in [place]? --     Constitutional: Alert and oriented. Well appearing and in no acute distress. Eyes: Conjunctivae are normal.  Head: Atraumatic. Nose: No congestion/rhinnorhea. Mouth/Throat: Mucous membranes are moist.  Oropharynx non-erythematous. Neck: No stridor.   Cardiovascular: Normal rate, regular rhythm. Grossly normal heart sounds.  Good peripheral circulation. Respiratory: Normal respiratory effort.  No retractions. Lungs scattered crackles Gastrointestinal: Soft and nontender. No distention. No abdominal bruits. No CVA tenderness. Musculoskeletal: No lower extremity tenderness nor edema.  No joint effusions. Neurologic:  Normal speech and language. No gross focal neurologic deficits are appreciated.  Skin:  Skin is warm, dry and intact. No rash noted. Psychiatric: Mood and affect are normal. Speech and behavior are normal.  ____________________________________________   LABS (all labs ordered are listed, but only abnormal results are displayed)  Labs Reviewed  BASIC METABOLIC PANEL - Abnormal; Notable for the following components:        Result Value   Sodium 133 (*)    Chloride 98 (*)    Glucose, Bld 380 (*)    BUN 25 (*)    Creatinine, Ser 1.66 (*)    GFR calc non Af Amer 47 (*)    GFR calc Af Amer 55 (*)    All other components within normal limits  CBC - Abnormal; Notable for the following components:   WBC 11.2 (*)    RBC 6.04 (*)    All other components within normal limits  TROPONIN I - Abnormal; Notable for the following components:   Troponin I 0.04 (*)    All other components within normal limits  TROPONIN I - Abnormal; Notable for the following components:   Troponin I 0.05 (*)    All other components within normal limits  TROPONIN I  HEPATIC FUNCTION PANEL   ____________________________________________  EKG  EKG read and interpreted by me shows sinus tach rate of 105 normal axis no acute ST-T wave changes ____________________________________________  RADIOLOGY  ED MD interpretationchest x-ray shows no acute disease chronic bronchitic changes are noted by the radiologist  Official radiology report(s): Dg Chest 2 View  Result Date: 09/30/2017 CLINICAL DATA:  Chest pain, cough, sore throat, and shortness of breath for 2 days, history CHF, COPD, hypertension, diabetes mellitus, smoker EXAM: CHEST  2 VIEW COMPARISON:  08/21/2017 FINDINGS: Upper normal size of cardiac silhouette. Mediastinal contours and pulmonary vascularity normal. Chronic bronchitic changes. No acute infiltrate, pleural effusion or pneumothorax. Bones unremarkable. IMPRESSION: Chronic bronchitic changes. No acute abnormalities. Electronically Signed   By: Lavonia Dana M.D.   On: 09/30/2017 17:36    ____________________________________________   PROCEDURES  Procedure(s) performed:   Procedures  Critical Care performed:   ____________________________________________   INITIAL IMPRESSION / ASSESSMENT AND PLAN / ED COURSE  [place] troponin is chronically elevated but it is trending upward and he had a 13 beat run of V.  tach with his chest pain. We will observe him overnight.  lhe has also developed severe low left lower quadrant abdominal pain which is making him writhing in pain is tender to palpation bowel sounds of been quiet we will CT his bel  ____________________________________________   FINAL CLINICAL IMPRESSION(S) / ED DIAGNOSES  Final diagnoses:  Nonspecific chest pain  V-tach [facility])     ED Discharge Orders    None       Note:  This document was prepared using Dragon voice recognition software and may include unintentional dictation errors.    Nena Polio, MD 09/30/17 2223    Nena Polio, MD 09/30/17 2227

## 2017-10-01 ENCOUNTER — Other Ambulatory Visit: Payer: Self-pay

## 2017-10-01 DIAGNOSIS — R748 Abnormal levels of other serum enzymes: Secondary | ICD-10-CM

## 2017-10-01 DIAGNOSIS — I472 Ventricular tachycardia: Secondary | ICD-10-CM

## 2017-10-01 DIAGNOSIS — R1012 Left upper quadrant pain: Secondary | ICD-10-CM

## 2017-10-01 DIAGNOSIS — E119 Type 2 diabetes mellitus without complications: Secondary | ICD-10-CM

## 2017-10-01 LAB — CBC
HCT: 48.6 % (ref 40.0–52.0)
Hemoglobin: 16.8 g/dL (ref 13.0–18.0)
MCH: 29.4 pg (ref 26.0–34.0)
MCHC: 34.5 g/dL (ref 32.0–36.0)
MCV: 85.2 fL (ref 80.0–100.0)
Platelets: 179 10*3/uL (ref 150–440)
RBC: 5.7 MIL/uL (ref 4.40–5.90)
RDW: 14.8 % — ABNORMAL HIGH (ref 11.5–14.5)
WBC: 11 10*3/uL — ABNORMAL HIGH (ref 3.8–10.6)

## 2017-10-01 LAB — HEPATIC FUNCTION PANEL
ALT: 32 U/L (ref 17–63)
AST: 25 U/L (ref 15–41)
Albumin: 4.1 g/dL (ref 3.5–5.0)
Alkaline Phosphatase: 70 U/L (ref 38–126)
Bilirubin, Direct: 0.1 mg/dL (ref 0.1–0.5)
Indirect Bilirubin: 1.4 mg/dL — ABNORMAL HIGH (ref 0.3–0.9)
Total Bilirubin: 1.5 mg/dL — ABNORMAL HIGH (ref 0.3–1.2)
Total Protein: 7.4 g/dL (ref 6.5–8.1)

## 2017-10-01 LAB — GLUCOSE, CAPILLARY
Glucose-Capillary: 234 mg/dL — ABNORMAL HIGH (ref 65–99)
Glucose-Capillary: 284 mg/dL — ABNORMAL HIGH (ref 65–99)

## 2017-10-01 LAB — BASIC METABOLIC PANEL
Anion gap: 16 — ABNORMAL HIGH (ref 5–15)
BUN: 35 mg/dL — ABNORMAL HIGH (ref 6–20)
CO2: 22 mmol/L (ref 22–32)
Calcium: 9.2 mg/dL (ref 8.9–10.3)
Chloride: 97 mmol/L — ABNORMAL LOW (ref 101–111)
Creatinine, Ser: 1.56 mg/dL — ABNORMAL HIGH (ref 0.61–1.24)
GFR calc Af Amer: 59 mL/min — ABNORMAL LOW (ref >60)
GFR calc non Af Amer: 51 mL/min — ABNORMAL LOW (ref >60)
Glucose, Bld: 273 mg/dL — ABNORMAL HIGH (ref 65–99)
Potassium: 3.2 mmol/L — ABNORMAL LOW (ref 3.5–5.1)
Sodium: 135 mmol/L (ref 135–145)

## 2017-10-01 LAB — MAGNESIUM: Magnesium: 1.5 mg/dL — ABNORMAL LOW (ref 1.7–2.4)

## 2017-10-01 LAB — TROPONIN I
Troponin I: 0.05 ng/mL (ref <0.03)
Troponin I: 0.04 ng/mL (ref <0.03)

## 2017-10-01 LAB — TSH: TSH: 2.456 u[IU]/mL (ref 0.350–4.500)

## 2017-10-01 MED ORDER — AMLODIPINE BESYLATE 5 MG PO TABS
5.0000 mg | ORAL_TABLET | Freq: Every day | ORAL | Status: DC
  Administered 2017-10-01: 5 mg via ORAL
  Filled 2017-10-01: qty 1

## 2017-10-01 MED ORDER — ONDANSETRON HCL 4 MG PO TABS: 4.0000 mg | ORAL_TABLET | Freq: Four times a day (QID) | ORAL | Status: DC | PRN

## 2017-10-01 MED ORDER — ROSUVASTATIN CALCIUM 10 MG PO TABS: 20.0000 mg | ORAL_TABLET | Freq: Every day | ORAL | Status: DC

## 2017-10-01 MED ORDER — INSULIN ASPART 100 UNIT/ML ~~LOC~~ SOLN
0.0000 [IU] | Freq: Four times a day (QID) | SUBCUTANEOUS | Status: DC
Start: 2017-10-01 — End: 2017-10-01
  Administered 2017-10-01: 3 [IU] via SUBCUTANEOUS
  Administered 2017-10-01: 5 [IU] via SUBCUTANEOUS
  Filled 2017-10-01 (×2): qty 1

## 2017-10-01 MED ORDER — HYDROMORPHONE HCL 1 MG/ML IJ SOLN
0.5000 mg | Freq: Once | INTRAMUSCULAR | Status: AC
  Administered 2017-10-01: 0.5 mg via INTRAVENOUS
  Filled 2017-10-01: qty 1

## 2017-10-01 MED ORDER — POLYETHYLENE GLYCOL 3350 17 G PO PACK: 17.0000 g | PACK | Freq: Every day | ORAL | 0 refills | Status: DC

## 2017-10-01 MED ORDER — TORSEMIDE 20 MG PO TABS
20.0000 mg | ORAL_TABLET | Freq: Every day | ORAL | Status: DC
  Filled 2017-10-01: qty 1

## 2017-10-01 MED ORDER — ENOXAPARIN SODIUM 40 MG/0.4ML ~~LOC~~ SOLN
40.0000 mg | SUBCUTANEOUS | Status: DC
  Administered 2017-10-01: 40 mg via SUBCUTANEOUS
  Filled 2017-10-01: qty 0.4

## 2017-10-01 MED ORDER — ONDANSETRON HCL 4 MG/2ML IJ SOLN
4.0000 mg | Freq: Four times a day (QID) | INTRAMUSCULAR | Status: DC | PRN
  Administered 2017-10-01: 4 mg via INTRAVENOUS
  Filled 2017-10-01: qty 2

## 2017-10-01 MED ORDER — SPIRONOLACTONE 25 MG PO TABS
25.0000 mg | ORAL_TABLET | Freq: Every day | ORAL | Status: DC
  Filled 2017-10-01: qty 1

## 2017-10-01 MED ORDER — ACETAMINOPHEN 325 MG PO TABS: 650.0000 mg | ORAL_TABLET | Freq: Four times a day (QID) | ORAL | Status: DC | PRN

## 2017-10-01 MED ORDER — METOPROLOL SUCCINATE ER 50 MG PO TB24
50.0000 mg | ORAL_TABLET | Freq: Every day | ORAL | Status: DC
  Administered 2017-10-01: 50 mg via ORAL
  Filled 2017-10-01: qty 1

## 2017-10-01 MED ORDER — TORSEMIDE 10 MG PO TABS: 20.0000 mg | ORAL_TABLET | Freq: Every day | ORAL | 0 refills | Status: DC

## 2017-10-01 MED ORDER — SACUBITRIL-VALSARTAN 49-51 MG PO TABS
1.0000 | ORAL_TABLET | Freq: Two times a day (BID) | ORAL | Status: DC
  Administered 2017-10-01 (×2): 1 via ORAL
  Filled 2017-10-01 (×2): qty 1

## 2017-10-01 MED ORDER — ACETAMINOPHEN 650 MG RE SUPP: 650.0000 mg | Freq: Four times a day (QID) | RECTAL | Status: DC | PRN

## 2017-10-01 MED ORDER — SIMETHICONE 40 MG/0.6ML PO SUSP
40.0000 mg | Freq: Once | ORAL | Status: AC
  Administered 2017-10-01: 40 mg via ORAL
  Filled 2017-10-01: qty 0.6

## 2017-10-01 MED ORDER — POLYETHYLENE GLYCOL 3350 17 G PO PACK
17.0000 g | PACK | Freq: Once | ORAL | Status: AC
  Administered 2017-10-01: 17 g via ORAL
  Filled 2017-10-01: qty 1

## 2017-10-01 MED ORDER — MORPHINE SULFATE (PF) 2 MG/ML IV SOLN
INTRAVENOUS | Status: AC
  Administered 2017-10-01: 2 mg via INTRAVENOUS
  Filled 2017-10-01: qty 1

## 2017-10-01 MED ORDER — ASPIRIN 81 MG PO CHEW
81.0000 mg | CHEWABLE_TABLET | Freq: Every day | ORAL | Status: DC
  Administered 2017-10-01: 81 mg via ORAL
  Filled 2017-10-01: qty 1

## 2017-10-01 NOTE — Progress Notes (Signed)
Pt to be discharged today. Iv and tele removed.  I told him his discharge instructions and prescrips would be ready in a few minutes. However, pt unwill to wait and left without them. I left a message on his cell phone that he could pick up his instructions and prescriptions at the nurses' station at his convenience.

## 2017-10-01 NOTE — Discharge Summary (Signed)
[facility] [place] NAME: Gabriel [name]    MR#:  [id number]  DATE OF BIRTH:  05/24/1971  DATE OF ADMISSION:  09/30/2017 ADMITTING PHYSICIAN: Lance Coon, MD  DATE OF DISCHARGE: 10/01/2017  PRIMARY CARE PHYSICIAN: [facility] [place]    ADMISSION DIAGNOSIS:  V-tach ([place]) [I47.2] Nonspecific chest pain [R07.9]  DISCHARGE DIAGNOSIS:  Principal Problem:   Atypical chest pain Active Problems:   HTN (hypertension)   Diabetes (HCC)   Hyperlipidemia   COPD (chronic obstructive pulmonary disease) (HCC)   NSVT (nonsustained ventricular tachycardia) (HCC)   Coronary artery disease, non-occlusive   Chronic systolic heart failure ([place])   SECONDARY DIAGNOSIS:   Past Medical History:  Diagnosis Date  . Chest wall pain, chronic   . Chronic systolic CHF (congestive heart failure) ([place])    a. 03/2015 Echo: EF 45-50%; b. 12/2015 Echo: EF 20-25%; c. 02/2016 Echo: EF 30-35%; d. 11/2016 Echo: EF 40-45%.  . Chronic Troponin Elevation   . CKD (chronic kidney disease), stage III ([place])   . COPD (chronic obstructive pulmonary disease) ([place])   . Diabetes mellitus without complication ([place])   . Hypertension    Resolved since weight loss  . NICM (nonischemic cardiomyopathy) ([place])    a. 03/2015 Echo: EF 45-50%; b. 04/2015 MV: small defect of mild severity in apex 2/2 apical thinning, EF 30-44%;  c. 12/2015 Echo: EF 20-25%; d. 02/2016 Echo: EF 30-35%; e. 11/2016 Echo: EF 40-45%, diff HK, mildly dil Ao root, mild MR, mod dil LA;  f. 12/2016 Cath: LM nl, LAD/Diags/LCX/OMs min irregs, RCA 40p/m/d.  . Non-obstructive CAD (coronary artery disease)    a. 04/2015 low risk MV;  b. 12/2016 Cath: minor irregs in LAD/Diag/LCX/OM, RCA 40p/m/d.  [name] (nonsustained ventricular tachycardia) ([place])    a. 12/2015 noted on tele-->amio;  b. 12/2015 Event monitor: no VT. Atach noted.  . Obesity (BMI 30.0-34.9)   . Psoriasis   . Renal insufficiency   . Syncope     a. 01/2016 - felt to be vasovagal.  . Tobacco abuse     HOSPITAL COURSE:   Patient came to the hospital complaining of chest pain, he had recurrent admissions for this complaint and seen by cardiologist and they suggested no further workup. They suggested to decrease the dose of torsemide because of his presence with some renal insufficiency. 's troponin remained negative and he was monitored on telemetry and remained asymptomatic. He also had some abdominal pain complaint and CT scan of abdomen showed some diverticula without evidence of diverticulitis. I'm suggesting him to take the MiraLAX daily to help with constipation and he should be okay to go today.  DISCHARGE CONDITIONS:   Stable.  CONSULTS OBTAINED:  Treatment Team:  Wellington Hampshire, MD  DRUG ALLERGIES:   Allergies  Allergen Reactions  . Prednisone Other (See Comments)    Reaction: Hallucinations     DISCHARGE MEDICATIONS:   Allergies as of 10/01/2017      Reactions   Prednisone Other (See Comments)   Reaction: Hallucinations      Medication List    TAKE these medications   amLODipine 5 MG tablet Commonly known as:  NORVASC Take 1 tablet (5 mg total) daily by mouth.   aspirin 81 MG chewable tablet Chew 1 tablet (81 mg total) by mouth daily. Reported on 02/08/2016   metFORMIN 500 MG tablet Commonly known as:  GLUCOPHAGE Take 1 tablet by mouth 2 (two) times daily.  metoprolol succinate 50 MG 24 hr tablet Commonly known as:  TOPROL-XL Take 50 mg by mouth daily. Take with or immediately following a meal.   polyethylene glycol packet Commonly known as:  MIRALAX / GLYCOLAX Take 17 g by mouth daily.   potassium chloride 10 MEQ tablet Commonly known as:  K-DUR Take 1 tablet (10 mEq total) daily by mouth.   ranitidine 150 MG tablet Commonly known as:  ZANTAC Take 1 tablet (150 mg total) by mouth 2 (two) times daily.   rosuvastatin 20 MG tablet Commonly known as:  CRESTOR Take 20 mg by mouth  daily.   sacubitril-valsartan 49-51 MG Commonly known as:  ENTRESTO Take 1 tablet 2 (two) times daily by mouth.   spironolactone 25 MG tablet Commonly known as:  ALDACTONE Take 1 tablet (25 mg total) by mouth daily.   torsemide 10 MG tablet Commonly known as:  DEMADEX Take 2 tablets (20 mg total) by mouth daily. What changed:  medication strength        DISCHARGE INSTRUCTIONS:    Follow with PMD in 2 weeks.  If you experience worsening of your admission symptoms, develop shortness of breath, life threatening emergency, suicidal or homicidal thoughts you must seek medical attention immediately by calling 911 or calling your MD immediately  if symptoms less severe.  You Must read complete instructions/literature along with all the possible adverse reactions/side effects for all the Medicines you take and that have been prescribed to you. Take any new Medicines after you have completely understood and accept all the possible adverse reactions/side effects.   Please note  You were cared for by a hospitalist during your hospital stay. If you have any questions about your discharge medications or the care you received while you were in the hospital after you are discharged, you can call the unit and asked to speak with the hospitalist on call if the hospitalist that took care of you is not available. Once you are discharged, your primary care physician will handle any further medical issues. Please note that NO REFILLS for any discharge medications will be authorized once you are discharged, as it is imperative that you return to your primary care physician (or establish a relationship with a primary care physician if you do not have one) for your aftercare needs so that they can reassess your need for medications and monitor your lab values.    Today   CHIEF COMPLAINT:   Chief Complaint  Patient presents with  . Chest Pain    HISTORY OF PRESENT ILLNESS:  Gabriel [name]  is a 47  y.o. male presents with chest pain, followed by development of abdominal pain while in the ED.  Workup showed mildly elevated troponin, patient has a history of mildly elevated troponin.  He does have significant cardiac history.  Hospitalist were called for admission and further evaluation.  Subsequent CT imaging of his abdomen and pelvis showed diverticulosis without diverticulitis.    VITAL SIGNS:  Blood pressure 103/68, pulse 81, temperature (!) 97.5 F (36.4 C), temperature source Oral, resp. rate 18, height 6' (1.829 m), weight 118.4 kg (261 lb), SpO2 95 %.  I/O:    Intake/Output Summary (Last 24 hours) at 10/01/2017 1138 Last data filed at 10/01/2017 0850 Gross per 24 hour  Intake -  Output 350 ml  Net -350 ml    PHYSICAL EXAMINATION:  GENERAL:  47 y.o.-year-old patient lying in the bed with no acute distress.  EYES: Pupils equal, round, reactive to light  and accommodation. No scleral icterus. Extraocular muscles intact.  HEENT: Head atraumatic, normocephalic. Oropharynx and nasopharynx clear.  NECK:  Supple, no jugular venous distention. No thyroid enlargement, no tenderness.  LUNGS: Normal breath sounds bilaterally, no wheezing, rales,rhonchi or crepitation. No use of accessory muscles of respiration.  CARDIOVASCULAR: S1, S2 normal. No murmurs, rubs, or gallops.  ABDOMEN: Soft, non-tender, non-distended. Bowel sounds present. No organomegaly or mass.  EXTREMITIES: No pedal edema, cyanosis, or clubbing.  NEUROLOGIC: Cranial nerves II through XII are intact. Muscle strength 5/5 in all extremities. Sensation intact. Gait not checked.  PSYCHIATRIC: The patient is alert and oriented x 3.  SKIN: No obvious rash, lesion, or ulcer.   DATA REVIEW:   CBC Recent Labs  Lab 10/01/17 0112  WBC 11.0*  HGB 16.8  HCT 48.6  PLT 179    Chemistries  Recent Labs  Lab 09/30/17 2320 10/01/17 0112  NA  --  135  K  --  3.2*  CL  --  97*  CO2  --  22  GLUCOSE  --  273*  BUN  --   35*  CREATININE  --  1.56*  CALCIUM  --  9.2  AST 25  --   ALT 32  --   ALKPHOS 70  --   BILITOT 1.5*  --     Cardiac Enzymes Recent Labs  Lab 10/01/17 0112  TROPONINI 0.04*    Microbiology Results  Results for orders placed or performed during the hospital encounter of 08/21/17  Group A Strep by PCR ([place] Only)     Status: None   Collection Time: 08/21/17  9:25 AM  Result Value Ref Range Status   Group A Strep by PCR NOT DETECTED NOT DETECTED Final    Comment: Performed at [facility], [address] [place], [place] [address]    RADIOLOGY:  Dg Chest 2 View  Result Date: 09/30/2017 CLINICAL DATA:  Chest pain, cough, sore throat, and shortness of breath for 2 days, history CHF, COPD, hypertension, diabetes mellitus, smoker EXAM: CHEST  2 VIEW COMPARISON:  08/21/2017 FINDINGS: Upper normal size of cardiac silhouette. Mediastinal contours and pulmonary vascularity normal. Chronic bronchitic changes. No acute infiltrate, pleural effusion or pneumothorax. Bones unremarkable. IMPRESSION: Chronic bronchitic changes. No acute abnormalities. Electronically Signed   By: Lavonia Dana M.D.   On: 09/30/2017 17:36   Ct Chest W Contrast  Result Date: 09/30/2017 CLINICAL DATA:  Chest pain x2 days with cough. Left upper quadrant abdominal pain as well. EXAM: CT CHEST, ABDOMEN, AND PELVIS WITH CONTRAST TECHNIQUE: Multidetector CT imaging of the chest, abdomen and pelvis was performed following the standard protocol during bolus administration of intravenous contrast. CONTRAST:  156mL ISOVUE-300 IOPAMIDOL (ISOVUE-300) INJECTION 61% COMPARISON:  None. FINDINGS: CT CHEST FINDINGS Cardiovascular: Heart is enlarged with left main, LAD and RCA coronary arteriosclerosis. Mediastinum/Nodes: No thyromegaly. 5 mm cystic nodule in the right thyroid gland without worrisome features noted. Normal branch pattern of the great vessels. Patent midline trachea. Esophagus is unremarkable. Lungs/Pleura: Lungs  are clear. No pleural effusion or pneumothorax. Musculoskeletal: No chest wall mass or suspicious bone lesions identified. Degenerate changes are noted along the dorsal spine most evident at T12-L1 marked disc space narrowing and discogenic endplate sclerosis. CT ABDOMEN PELVIS FINDINGS Hepatobiliary: No space-occupying mass of the liver. No biliary dilatation. Physiologic distention of the gallbladder. Pancreas: Normal Spleen: Normal Adrenals/Urinary Tract: Normal bilateral adrenal glands. Mild cortical thinning in the lower pole the right kidney overlying a small water attenuating  cyst measures 1.5 cm in diameter. No nephrolithiasis nor hydroureteronephrosis. Normal bladder. Stomach/Bowel: The stomach is distended with ingested oral contrast. There is normal small bowel rotation without acute bowel inflammation or obstruction. Normal-appearing appendix is identified. Scattered colonic diverticulosis without acute diverticulitis more so along the sigmoid colon. Vascular/Lymphatic: Aortoiliac atherosclerosis without aneurysm. No lymphadenopathy. Reproductive: Normal seminal vesicles and prostate. Other: Prominent intrapelvic fat compatible pelvic lipomatosis. Musculoskeletal: Lumbar degenerative disc disease compatible with spondylitic change. This is most evident at T12-L1 with lesser degrees of disc space narrowing at L3-4 and L4-5. IMPRESSION: Chest CT: 1. Coronary arteriosclerosis. 2. No large central pulmonary embolus. No aortic aneurysm or dissection. 3. No active pulmonary disease. 4. Tiny 5 mm right thyroid nodule without worrisome features. No additional workup required per consensus guidelines given size and lack of worrisome features. CT abdomen and pelvis: 1. Scattered colonic diverticulosis without acute diverticulitis. 2. 1.5 cm lower pole renal cyst with mild cortical thinning. 3. Pelvic lipomatosis. 4. Lumbar spondylosis. Electronically Signed   By: Ashley Royalty M.D.   On: 09/30/2017 23:46   Ct  Abdomen Pelvis W Contrast  Result Date: 09/30/2017 CLINICAL DATA:  Chest pain x2 days with cough. Left upper quadrant abdominal pain as well. EXAM: CT CHEST, ABDOMEN, AND PELVIS WITH CONTRAST TECHNIQUE: Multidetector CT imaging of the chest, abdomen and pelvis was performed following the standard protocol during bolus administration of intravenous contrast. CONTRAST:  144mL ISOVUE-300 IOPAMIDOL (ISOVUE-300) INJECTION 61% COMPARISON:  None. FINDINGS: CT CHEST FINDINGS Cardiovascular: Heart is enlarged with left main, LAD and RCA coronary arteriosclerosis. Mediastinum/Nodes: No thyromegaly. 5 mm cystic nodule in the right thyroid gland without worrisome features noted. Normal branch pattern of the great vessels. Patent midline trachea. Esophagus is unremarkable. Lungs/Pleura: Lungs are clear. No pleural effusion or pneumothorax. Musculoskeletal: No chest wall mass or suspicious bone lesions identified. Degenerate changes are noted along the dorsal spine most evident at T12-L1 marked disc space narrowing and discogenic endplate sclerosis. CT ABDOMEN PELVIS FINDINGS Hepatobiliary: No space-occupying mass of the liver. No biliary dilatation. Physiologic distention of the gallbladder. Pancreas: Normal Spleen: Normal Adrenals/Urinary Tract: Normal bilateral adrenal glands. Mild cortical thinning in the lower pole the right kidney overlying a small water attenuating cyst measures 1.5 cm in diameter. No nephrolithiasis nor hydroureteronephrosis. Normal bladder. Stomach/Bowel: The stomach is distended with ingested oral contrast. There is normal small bowel rotation without acute bowel inflammation or obstruction. Normal-appearing appendix is identified. Scattered colonic diverticulosis without acute diverticulitis more so along the sigmoid colon. Vascular/Lymphatic: Aortoiliac atherosclerosis without aneurysm. No lymphadenopathy. Reproductive: Normal seminal vesicles and prostate. Other: Prominent intrapelvic fat  compatible pelvic lipomatosis. Musculoskeletal: Lumbar degenerative disc disease compatible with spondylitic change. This is most evident at T12-L1 with lesser degrees of disc space narrowing at L3-4 and L4-5. IMPRESSION: Chest CT: 1. Coronary arteriosclerosis. 2. No large central pulmonary embolus. No aortic aneurysm or dissection. 3. No active pulmonary disease. 4. Tiny 5 mm right thyroid nodule without worrisome features. No additional workup required per consensus guidelines given size and lack of worrisome features. CT abdomen and pelvis: 1. Scattered colonic diverticulosis without acute diverticulitis. 2. 1.5 cm lower pole renal cyst with mild cortical thinning. 3. Pelvic lipomatosis. 4. Lumbar spondylosis. Electronically Signed   By: Ashley Royalty M.D.   On: 09/30/2017 23:46    EKG:   Orders placed or performed during the hospital encounter of 09/30/17  . ED EKG within 10 minutes  . ED EKG within 10 minutes      Management  plans discussed with the patient, family and they are in agreement.  CODE STATUS:     Code Status Orders  (From admission, onward)        Start     Ordered   10/01/17 0028  Full code  Continuous     10/01/17 0027    Code Status History    Date Active Date Inactive Code Status Order ID Comments User Context   08/21/2017 13:50 08/24/2017 16:15 Full Code [id number]  Gladstone Lighter, MD Inpatient   04/28/2017 16:53 04/29/2017 17:55 Full Code [id number]  Gladstone Lighter, MD Inpatient   01/05/2017 15:55 01/07/2017 16:43 Full Code [id number]  Nicholes Mango, MD ED   11/28/2016 13:42 11/29/2016 22:59 Full Code [id number]  Demetrios Loll, MD Inpatient   10/28/2016 13:58 10/30/2016 13:54 Full Code [id number]  Hillary Bow, MD ED   03/15/2016 07:58 03/16/2016 15:08 Full Code [id number]  Harrie Foreman, MD Inpatient   02/16/2016 02:24 02/17/2016 13:02 Full Code [id number]  Harrie Foreman, MD Inpatient   01/22/2016 23:51 01/23/2016 16:41 Full Code [id number]  Demetrios Loll, MD ED   01/03/2016  14:22 01/09/2016 14:54 Full Code [id number]  Lytle Butte, MD ED   05/17/2015 23:41 05/19/2015 17:31 Full Code [id number]  Lance Coon, MD Inpatient   03/20/2015 08:26 03/21/2015 15:27 Full Code [id number]  Harrie Foreman, MD Inpatient      TOTAL TIME TAKING CARE OF THIS PATIENT: 35 minutes.    Vaughan Basta M.D on 10/01/2017 at 11:38 AM  Between 7am to 6pm - Pager - [phone]  After 6pm go to [email or web address] - password [email or web address]  Office  [phone]  CC: Primary care physician; [facility]   Note: This dictation was prepared with Dragon dictation along with smaller phrase technology. Any transcriptional errors that result from this process are unintentional.

## 2017-10-01 NOTE — Plan of Care (Signed)
Patient admitted at [place] [facility] with complaints of left lower quadrant abdominal pain that in not relieved by morphine. Profile completed. VSS. Patient is lying in bed. Notified Dr. Jannifer Franklin about current condition. Received orders to administer for pain.

## 2017-10-01 NOTE — Consult Note (Signed)
Cardiology Consultation:   Patient ID: Gabriel [name].; [id number]; [birth year]   Admit date: 09/30/2017 Date of Consult: 10/01/2017  Primary Care Provider: [place], [place] Primary Cardiologist: Rockey Situ   Patient Profile:   Gabriel [name]. is a 47 y.o. male with a hx of chronic systolic CHF due to [facility] with cardiac cath in 12/2016 showing nonobstructive CAD, medication noncompliance, COPD, CKD stage III, ongoing tobacco abuse, prior Neisseria meningitis PNA and bacteremia in 12/2015, vasovagal syncope, obesity, HTN, chronic chest wall pain, and NSVT who is being seen today for the evaluation of atypical chest pain with minimal, chronically elevated troponin at the request of Dr. Jannifer Franklin.  History of Present Illness:   Gabriel [name] was previously admitted in August 2016 and September 2016 with atypical chest pain. Echo during August 2016 admission showed mildly reduced LV systolic function with EF of 45-50%, GR1DD, mild MR, mildly dilated left atrium read by outside group. He was noted to have a mildly elevated troponin and BNP. He had been hypertensive and unable to afford his medications at that time. He was discharged back in August 2016 on antihypertensives, but did not take them. During his September 2016 admission for atypical chest pain and HTN he underwent nuclear stress testing that showed a small defect of mild severity in the apex location felt to be 2/2 apical thinning. EF was estimated at 30-44%. Overall, this was an intermediate risk scan. There was no evidence of significant perfusion abnormalities and he was felt to have NICM. Admitted in mid May 2017 with cough and SOB, found to have Neisseria meningitis PNA and bacteremia. Cardiology saw patient for elevated troponin in the setting of septic shock. Echo showed an EF of 20-25%, diffuse HK, RWMA could not be excluded, mild MR, LA moderately dilated, RV systolic function was normal, PASP was normal. CT chest  revealed no coronary calcifications. He had short runs of NSVT on tele. He was started on amiodarone as an outpatient in hospital follow up on 01/17/16. He wore a Holter monitor to evaluate for arrhythmia, which showed only atrial tach. Admitted June 2017 for syncope felt to be vasovagal. No arrhythmia seen on tele. In hospital follow up he had been noncompliant with medications. He reported fatigue, though was able to go swimming with friends and family. His medications were restarted. Echo from 02/29/16 showed an EF of 30-35%, diffuse HK, LV diastolic function was normal, mild AI/MR, PASP 47 mmHg. Admitted to [facility] 02/2016 with nonexertional chest pain in the setting of persistent RUL PNA. Symptoms were felt to be 2/2 persistent RUL PNA. He was gently diuresed. He was seen in the ED in 09/2016 for CAP, treated with ABX and discharged from the ED. Admitted in 10/2016 for acute on chronic CHF. Cardiology was not consulted and he was diuresed by IM. Admitted again in 11/2016 with acute on chronic CHF leading to diuresis. Seen in ED later in 11/2016 for renal stone. Admitted 12/2016 with chest pain/SOB. He underwent LHC that showed nonobstructive CAD. Medical therapy and compliance were advised. Seen in the ED in 01/2017 for chest pain with outpatient follow up advised given recent negative cath and improved troponin from baseline. Seen again in the ED, this time at [facility], in late 01/2017 for chronic atypical chest pain. Nonacute workup with outpatient follow up advised. Admitted 04/2017 for dizziness/orthostatic hypotension in the setting of dehydration. Echo 04/2017 showed EF 45-50%, Gr1DD, mild MR, mildly dilated left atrium, normal RVSF. He was  hydrated with improvement in symptoms. Since that admission, he has been seen in the ED several times for varying complaints including gastritis, back pain, and HTN. He was admitted to the hospital in 08/2017 for atypical chest pain. CTA chest was negative for PE. Cardiology did not  recommend any further workup.   Patient presented to the ED on 2/12 with LLQ abdominal pain with associated mild constipation. He underwent CT abdomen/pelvis that showed diverticulosis without acute explanation for his pain. Troponin was checked and found to be minimally elevated at 0.05, which is consistent with his chronic troponin elevation. He was also noted to be dehydrated with a SCR of 1.66. WBC 11.2. T bili 1.5. Because of his abdominal pain and minimal troponin elevation he was admitted. EKG is not acute as below. Cardiology has been consulted for his troponin bump. In talking with the patient today, he denies having chest pain. He reports his complaint is LLQ abdominal pain that still persists.     Past Medical History:  Diagnosis Date  . Chest wall pain, chronic   . Chronic systolic CHF (congestive heart failure) ([place])    a. 03/2015 Echo: EF 45-50%; b. 12/2015 Echo: EF 20-25%; c. 02/2016 Echo: EF 30-35%; d. 11/2016 Echo: EF 40-45%.  . Chronic Troponin Elevation   . CKD (chronic kidney disease), stage III ([place])   . COPD (chronic obstructive pulmonary disease) ([place])   . Diabetes mellitus without complication ([place])   . Hypertension    Resolved since weight loss  . NICM (nonischemic cardiomyopathy) ([place])    a. 03/2015 Echo: EF 45-50%; b. 04/2015 MV: small defect of mild severity in apex 2/2 apical thinning, EF 30-44%;  c. 12/2015 Echo: EF 20-25%; d. 02/2016 Echo: EF 30-35%; e. 11/2016 Echo: EF 40-45%, diff HK, mildly dil Ao root, mild MR, mod dil LA;  f. 12/2016 Cath: LM nl, LAD/Diags/LCX/OMs min irregs, RCA 40p/m/d.  . Non-obstructive CAD (coronary artery disease)    a. 04/2015 low risk MV;  b. 12/2016 Cath: minor irregs in LAD/Diag/LCX/OM, RCA 40p/m/d.  [name] (nonsustained ventricular tachycardia) ([place])    a. 12/2015 noted on tele-->amio;  b. 12/2015 Event monitor: no VT. Atach noted.  . Obesity (BMI 30.0-34.9)   . Psoriasis   . Renal insufficiency   . Syncope    a. 01/2016 - felt to be  vasovagal.  . Tobacco abuse     Past Surgical History:  Procedure Laterality Date  . AMPUTATION    . CARDIAC CATHETERIZATION    . FINGER AMPUTATION     Traumatic  . LEFT HEART CATH AND CORONARY ANGIOGRAPHY N/A 01/06/2017   Procedure: Left Heart Cath and Coronary Angiography;  Surgeon: Wellington Hampshire, MD;  Location: [place] [facility];  Service: Cardiovascular;  Laterality: N/A;     Home Meds: Prior to Admission medications   Medication Sig Start Date End Date Taking? Authorizing Provider  amLODipine (NORVASC) 5 MG tablet Take 1 tablet (5 mg total) daily by mouth. 06/30/17 06/30/18 Yes Eula Listen, MD  aspirin 81 MG chewable tablet Chew 1 tablet (81 mg total) by mouth daily. Reported on 02/08/2016 06/02/17  Yes [name] A, FNP  metFORMIN (GLUCOPHAGE) 500 MG tablet Take 1 tablet by mouth 2 (two) times daily.   Yes [provider]  metoprolol succinate (TOPROL-XL) 50 MG 24 hr tablet Take 50 mg by mouth daily. Take with or immediately following a meal.   Yes [provider]  potassium chloride (K-DUR) 10 MEQ tablet Take 1  tablet (10 mEq total) daily by mouth. 07/07/17  Yes Gollan, Kathlene November, MD  ranitidine (ZANTAC) 150 MG tablet Take 1 tablet (150 mg total) by mouth 2 (two) times daily. 06/04/17  Yes Hackney, Otila Kluver A, FNP  rosuvastatin (CRESTOR) 20 MG tablet Take 20 mg by mouth daily.   Yes [provider]  sacubitril-valsartan (ENTRESTO) 49-51 MG Take 1 tablet 2 (two) times daily by mouth. 06/27/17  Yes Minna Merritts, MD  spironolactone (ALDACTONE) 25 MG tablet Take 1 tablet (25 mg total) by mouth daily. 06/02/17  Yes Hackney, Otila Kluver A, FNP  torsemide (DEMADEX) 20 MG tablet Take 1 tablet (20 mg total) by mouth daily. 06/02/17  Yes Alisa Graff, FNP    Inpatient Medications: Scheduled Meds: . amLODipine  5 mg Oral Daily  . aspirin  81 mg Oral Daily  . enoxaparin (LOVENOX) injection  40 mg Subcutaneous Q24H  . insulin aspart  0-9 Units  Subcutaneous Q6H  . metoprolol succinate  50 mg Oral Daily  . rosuvastatin  20 mg Oral Daily  . sacubitril-valsartan  1 tablet Oral BID  . spironolactone  25 mg Oral Daily  . torsemide  20 mg Oral Daily   Continuous Infusions:  PRN Meds: acetaminophen **OR** acetaminophen, morphine injection, ondansetron **OR** ondansetron (ZOFRAN) IV  Allergies:   Allergies  Allergen Reactions  . Prednisone Other (See Comments)    Reaction: Hallucinations     Social History:   Social History   Socioeconomic History  . Marital status: Widowed    Spouse name: Not on file  . Number of children: 1  . Years of education: 17  . Highest education level: 12th grade  Social Needs  . Financial resource strain: Very hard  . Food insecurity - worry: Often true  . Food insecurity - inability: Often true  . Transportation needs - medical: No  . Transportation needs - non-medical: No  Occupational History  . Not on file  Tobacco Use  . Smoking status: Current Some Day Smoker    Years: 33.00    Types: Cigarettes  . Smokeless tobacco: Never Used  . Tobacco comment: one cigarette in the past week  Substance and Sexual Activity  . Alcohol use: No    Alcohol/week: 0.0 oz    Frequency: Never    Comment: occassionally  . Drug use: No  . Sexual activity: Not on file  Other Topics Concern  . Not on file  Social History Narrative   Independent at baseline. Lives either with dad or girlfriend     Family History:   Family History  Problem Relation Age of Onset  . Diabetes Mellitus II Mother   . Hypertension Father   . Cancer Paternal Aunt   . Cancer Maternal Grandfather     ROS:  Review of Systems  Constitutional: Positive for malaise/fatigue. Negative for chills, diaphoresis, fever and weight loss.  HENT: Negative for congestion.   Eyes: Negative for discharge and redness.  Respiratory: Negative for cough, hemoptysis, sputum production, shortness of breath and wheezing.   Cardiovascular:  Positive for chest pain. Negative for palpitations, orthopnea, claudication, leg swelling and PND.  Gastrointestinal: Positive for abdominal pain and constipation. Negative for blood in stool, diarrhea, heartburn, melena, nausea and vomiting.  Genitourinary: Negative for hematuria.  Musculoskeletal: Negative for falls and myalgias.  Skin: Negative for rash.  Neurological: Positive for weakness. Negative for dizziness, tingling, tremors, sensory change, speech change, focal weakness and loss of consciousness.  Endo/Heme/Allergies: Does not bruise/bleed easily.  Psychiatric/Behavioral: Positive for substance abuse. The patient is not nervous/anxious.   All other systems reviewed and are negative.     Physical Exam/Data:   Vitals:   10/01/17 0000 10/01/17 0024 10/01/17 0334 10/01/17 0848  BP: 126/72 132/87 103/72 103/68  Pulse: 83 88 79 81  Resp: 18 18    Temp:  97.6 F (36.4 C) 97.6 F (36.4 C) (!) 97.5 F (36.4 C)  TempSrc:  Oral Oral Oral  SpO2: 93% 94% 95% 95%  Weight:      Height:        Intake/Output Summary (Last 24 hours) at 10/01/2017 0946 Last data filed at 10/01/2017 0850 Gross per 24 hour  Intake -  Output 350 ml  Net -350 ml   Filed Weights   09/30/17 1701  Weight: 261 lb (118.4 kg)   Body mass index is 35.4 kg/m.   Physical Exam: General: Well developed, well nourished, in no acute distress. Head: Normocephalic, atraumatic, sclera non-icteric, no xanthomas, nares without discharge.  Neck: Negative for carotid bruits. JVD not elevated. Lungs: Clear bilaterally to auscultation without wheezes, rales, or rhonchi. Breathing is unlabored. Heart: RRR with S1 S2. No murmurs, rubs, or gallops appreciated. Abdomen: Soft, LLQ TTP, non-distended with hypoactive bowel sounds. No hepatomegaly. No rebound/guarding. No obvious abdominal masses. Msk:  Strength and tone appear normal for age. Extremities: No clubbing or cyanosis. No edema. Distal pedal pulses are 2+ and  equal bilaterally. Neuro: Alert and oriented X 3. No facial asymmetry. No focal deficit. Moves all extremities spontaneously. Psych:  Responds to questions appropriately with a normal affect.   EKG:  The EKG was personally reviewed and demonstrates: sinus tachycardia, 105 bpm, poor R wave progression, nonspecific inferolateral st/t changes Telemetry:  Telemetry was personally reviewed and demonstrates: NSR, 13 beats NSVT  Weights: Filed Weights   09/30/17 1701  Weight: 261 lb (118.4 kg)    Relevant CV Studies: TTE 04/2017: Study Conclusions  - Left ventricle: The cavity size was normal. Wall thickness was   increased in a pattern of mild LVH. Systolic function was mildly   reduced. The estimated ejection fraction was in the range of 45%   to 50%. Doppler parameters are consistent with abnormal left   ventricular relaxation (grade 1 diastolic dysfunction). - Aortic valve: Probably trileaflet; mildly thickened, mildly   calcified leaflets. There was mild regurgitation directed   eccentrically in the LVOT. - Left atrium: The atrium was mildly dilated. - Right ventricle: The cavity size was normal. Systolic function   was normal. - Pericardium, extracardiac: A trivial pericardial effusion was   identified posterior to the heart.   LHC 12/2016: Conclusion     There is moderate to severe left ventricular systolic dysfunction.  LV end diastolic pressure is moderately elevated.  The left ventricular ejection fraction is 25-35% by visual estimate.  Dist RCA lesion, 40 %stenosed.  Prox RCA to Mid RCA lesion, 40 %stenosed.   1. Mild nonobstructive coronary artery disease. 2. Moderately to severely reduced LV systolic function with an EF of 30-35%. 3. Moderately elevated left ventricular end-diastolic pressure. LVEDP was in the high 20s.  Recommendations: Continue medical therapy for nonobstructive coronary artery disease. The patient has nonischemic cardiomyopathy. He is  volume overloaded and I increased torsemide to 40 mg by mouth twice daily. Consider adding spironolactone or eplerenone in the near future.     Laboratory Data:  Chemistry Recent Labs  Lab 09/30/17 1702 10/01/17 0112  NA 133* 135  K 3.6 3.2*  CL 98* 97*  CO2 22 22  GLUCOSE 380* 273*  BUN 25* 35*  CREATININE 1.66* 1.56*  CALCIUM 9.7 9.2  GFRNONAA 47* 51*  GFRAA 55* 59*  ANIONGAP 13 16*    Recent Labs  Lab 09/30/17 2320  PROT 7.4  ALBUMIN 4.1  AST 25  ALT 32  ALKPHOS 70  BILITOT 1.5*   Hematology Recent Labs  Lab 09/30/17 1702 10/01/17 0112  WBC 11.2* 11.0*  RBC 6.04* 5.70  HGB 17.8 16.8  HCT 51.3 48.6  MCV 84.9 85.2  MCH 29.5 29.4  MCHC 34.7 34.5  RDW 14.5 14.8*  PLT 212 179   Cardiac Enzymes Recent Labs  Lab 09/30/17 1702 09/30/17 2105 09/30/17 2320 10/01/17 0112  TROPONINI 0.04* 0.05* 0.05* 0.04*   No results for input(s): TROPIPOC in the last 168 hours.  BNPNo results for input(s): BNP, PROBNP in the last 168 hours.  DDimer No results for input(s): DDIMER in the last 168 hours.  Radiology/Studies:  Dg Chest 2 View  Result Date: 09/30/2017 IMPRESSION: Chronic bronchitic changes. No acute abnormalities. Electronically Signed   By: Lavonia Dana M.D.   On: 09/30/2017 17:36   Ct Chest W Contrast  Result Date: 09/30/2017 IMPRESSION: Chest CT: 1. Coronary arteriosclerosis. 2. No large central pulmonary embolus. No aortic aneurysm or dissection. 3. No active pulmonary disease. 4. Tiny 5 mm right thyroid nodule without worrisome features. No additional workup required per consensus guidelines given size and lack of worrisome features. CT abdomen and pelvis: 1. Scattered colonic diverticulosis without acute diverticulitis. 2. 1.5 cm lower pole renal cyst with mild cortical thinning. 3. Pelvic lipomatosis. 4. Lumbar spondylosis. Electronically Signed   By: Ashley Royalty M.D.   On: 09/30/2017 23:46   Ct Abdomen Pelvis W Contrast  Result Date:  09/30/2017 IMPRESSION: Chest CT: 1. Coronary arteriosclerosis. 2. No large central pulmonary embolus. No aortic aneurysm or dissection. 3. No active pulmonary disease. 4. Tiny 5 mm right thyroid nodule without worrisome features. No additional workup required per consensus guidelines given size and lack of worrisome features. CT abdomen and pelvis: 1. Scattered colonic diverticulosis without acute diverticulitis. 2. 1.5 cm lower pole renal cyst with mild cortical thinning. 3. Pelvic lipomatosis. 4. Lumbar spondylosis. Electronically Signed   By: Ashley Royalty M.D.   On: 09/30/2017 23:46    Assessment and Plan:   1. Elevated troponin: -Minimally elevated without significant trend in the setting of his underlying CKD with nonobstructive CAD -Recent LHC in 12/2016 as above -Patient never with chest pain -No plans for inpatient cardiac work up -Will cancel echo as patient just had one in 04/2017 -Continue PTA medications  2. Abdominal pain: -Per IM  3. NICM: -He does not appear grossly volume up at this time -In fact, he is somewhat volume depleted -Will hold torsemide and spironolactone today -Continue Lopressor and Entresto -Resume evidence-based heart failure medications as BP and renal function allow  4. NSVT: -This is not new for him -Continue Lopressor -If he has increasing amounts of ectopy may need to place back on amiodarone as he was on this in the past -Check magnesium with recommendation to replete to goal > 2.0 as indicated  -Replete potassium to goal > 4.0 -Check TSH  5. Acute on CKD stage III-IV: -Likely ATN in the setting of dehydration  -Gentle fluids -Per IM  6. Leukocytosis: -Per IM   For questions or updates, please contact [place] Please consult [email or web address] for contact info under Cardiology/STEMI.   Signed,  Christell Faith, PA-C [place] Pager: [phone] 10/01/2017, 9:46 AM

## 2017-10-03 ENCOUNTER — Telehealth: Payer: Self-pay | Admitting: Dietician

## 2017-10-03 NOTE — Telephone Encounter (Signed)
Called patient to reschedule class 3, which he missed on 10/02/17. Unable to leave a message as phone line not accepting calls at this time.

## 2017-10-07 ENCOUNTER — Telehealth: Payer: Self-pay | Admitting: Cardiovascular Disease

## 2017-10-07 NOTE — Telephone Encounter (Signed)
Received Disability forms .  [place] for patient .  Mailed packet to home address and forms sent inter office mail to [employer]

## 2017-10-13 ENCOUNTER — Telehealth: Payer: Self-pay | Admitting: Pharmacy Technician

## 2017-10-13 NOTE — Telephone Encounter (Signed)
Received updated proof of income.  Patient eligible to receive medication assistance at [facility] through 2019, as long as eligibility requirements continue to be met.  [facility]

## 2017-10-14 ENCOUNTER — Telehealth: Payer: Self-pay | Admitting: Dietician

## 2017-10-14 NOTE — Telephone Encounter (Signed)
Have not heard from pt-called pt and no answer-left message on voice mail for pt to call us and reschedule class 3

## 2017-10-28 ENCOUNTER — Telehealth: Payer: Self-pay | Admitting: Pharmacist

## 2017-10-28 ENCOUNTER — Encounter: Payer: Self-pay | Admitting: Dietician

## 2017-10-28 NOTE — Telephone Encounter (Signed)
10/28/2017 2:43:01 PM - [name] refill  10/28/17 Called [employer] spoke with Seth Bake to refill patient's [name] 49/51, scheduled for delivery on Friday, March 15. Patient has 2 refills left.Gabriel [name]

## 2017-10-28 NOTE — Progress Notes (Signed)
Have not heard from pt -will send discharge letter to provider

## 2017-11-14 IMAGING — CR DG ANKLE COMPLETE 3+V*L*
1 series · 3 of 3 positions shown · non-contrast
Comparison: No recent prior.

CLINICAL DATA: Injury.

EXAM:
LEFT ANKLE COMPLETE - 3+ VIEW

[Series 1: dg ankle complete left · 0.14mm/px · 3 of 3 slices shown]
[im 1/3]
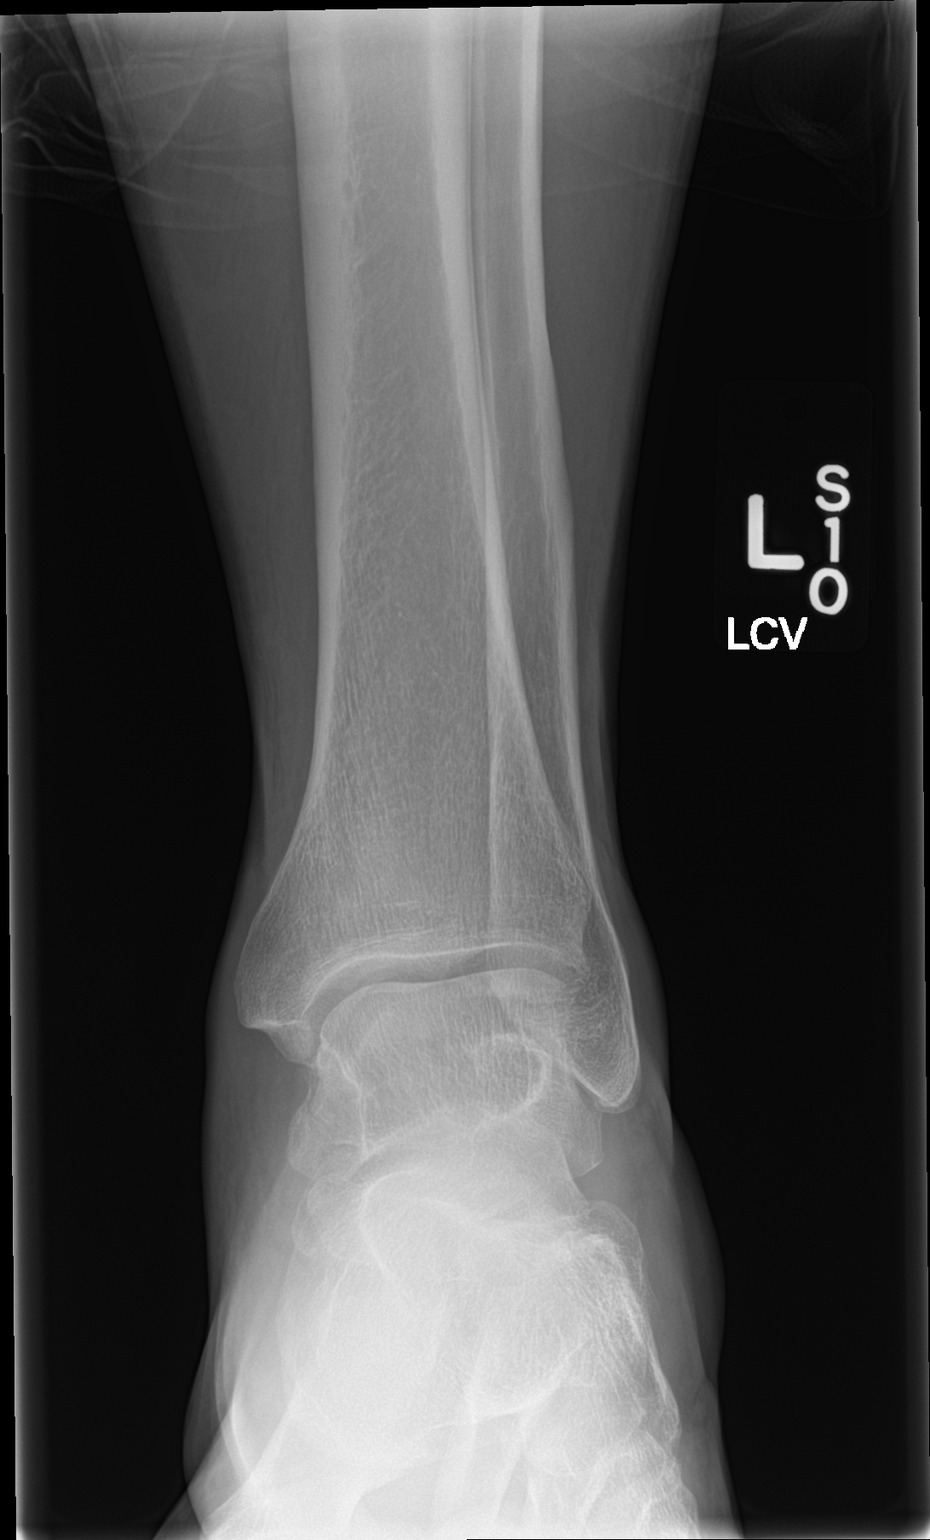
[im 2/3]
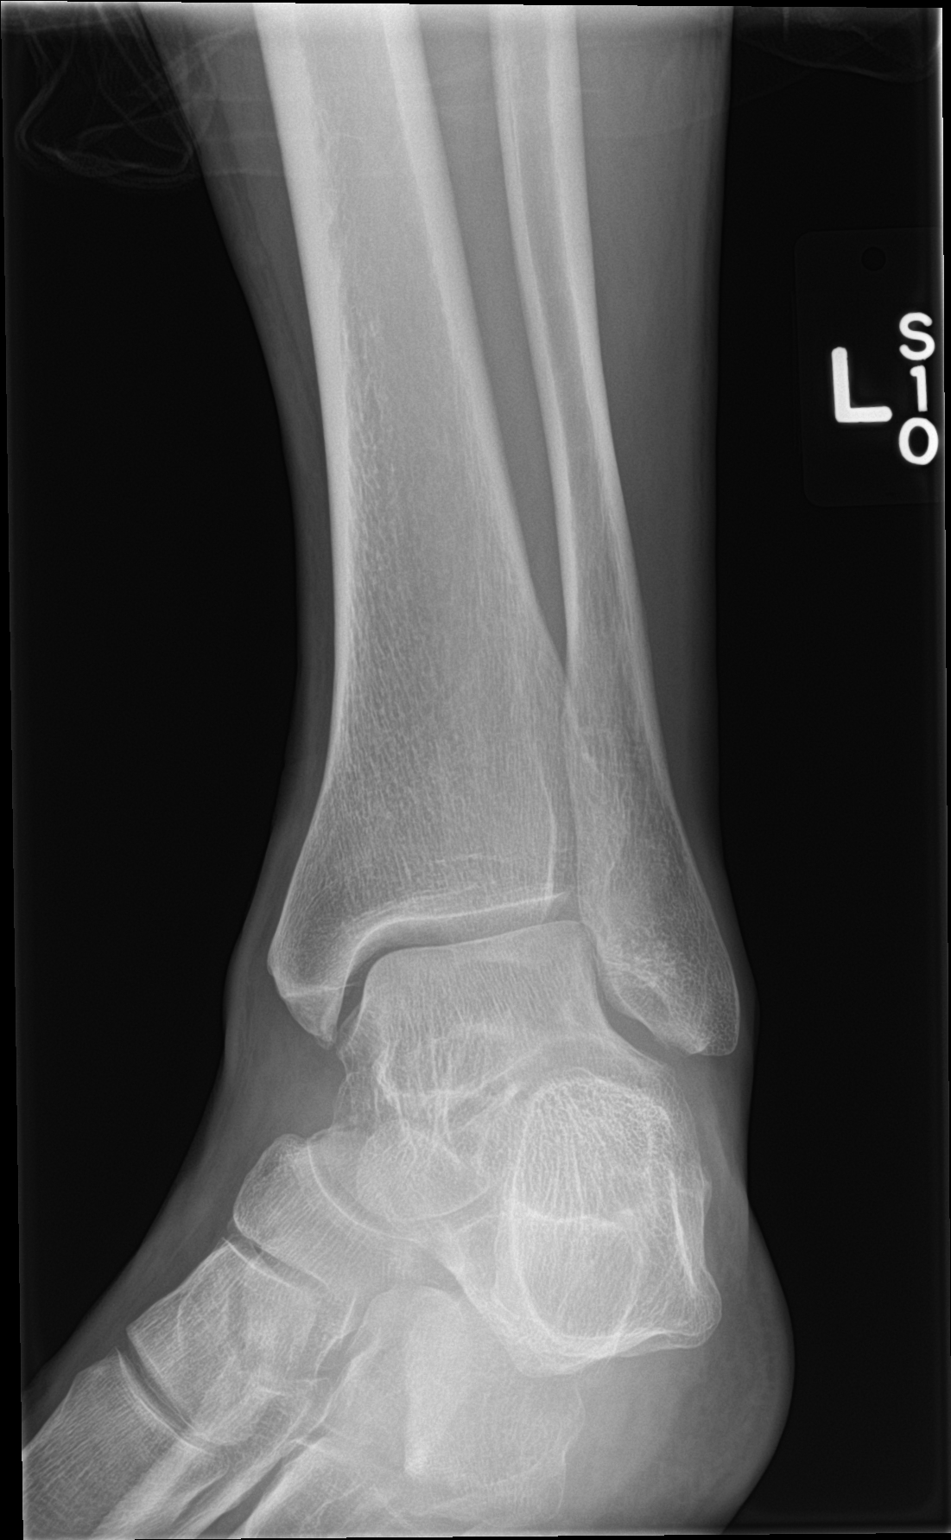
[im 3/3]
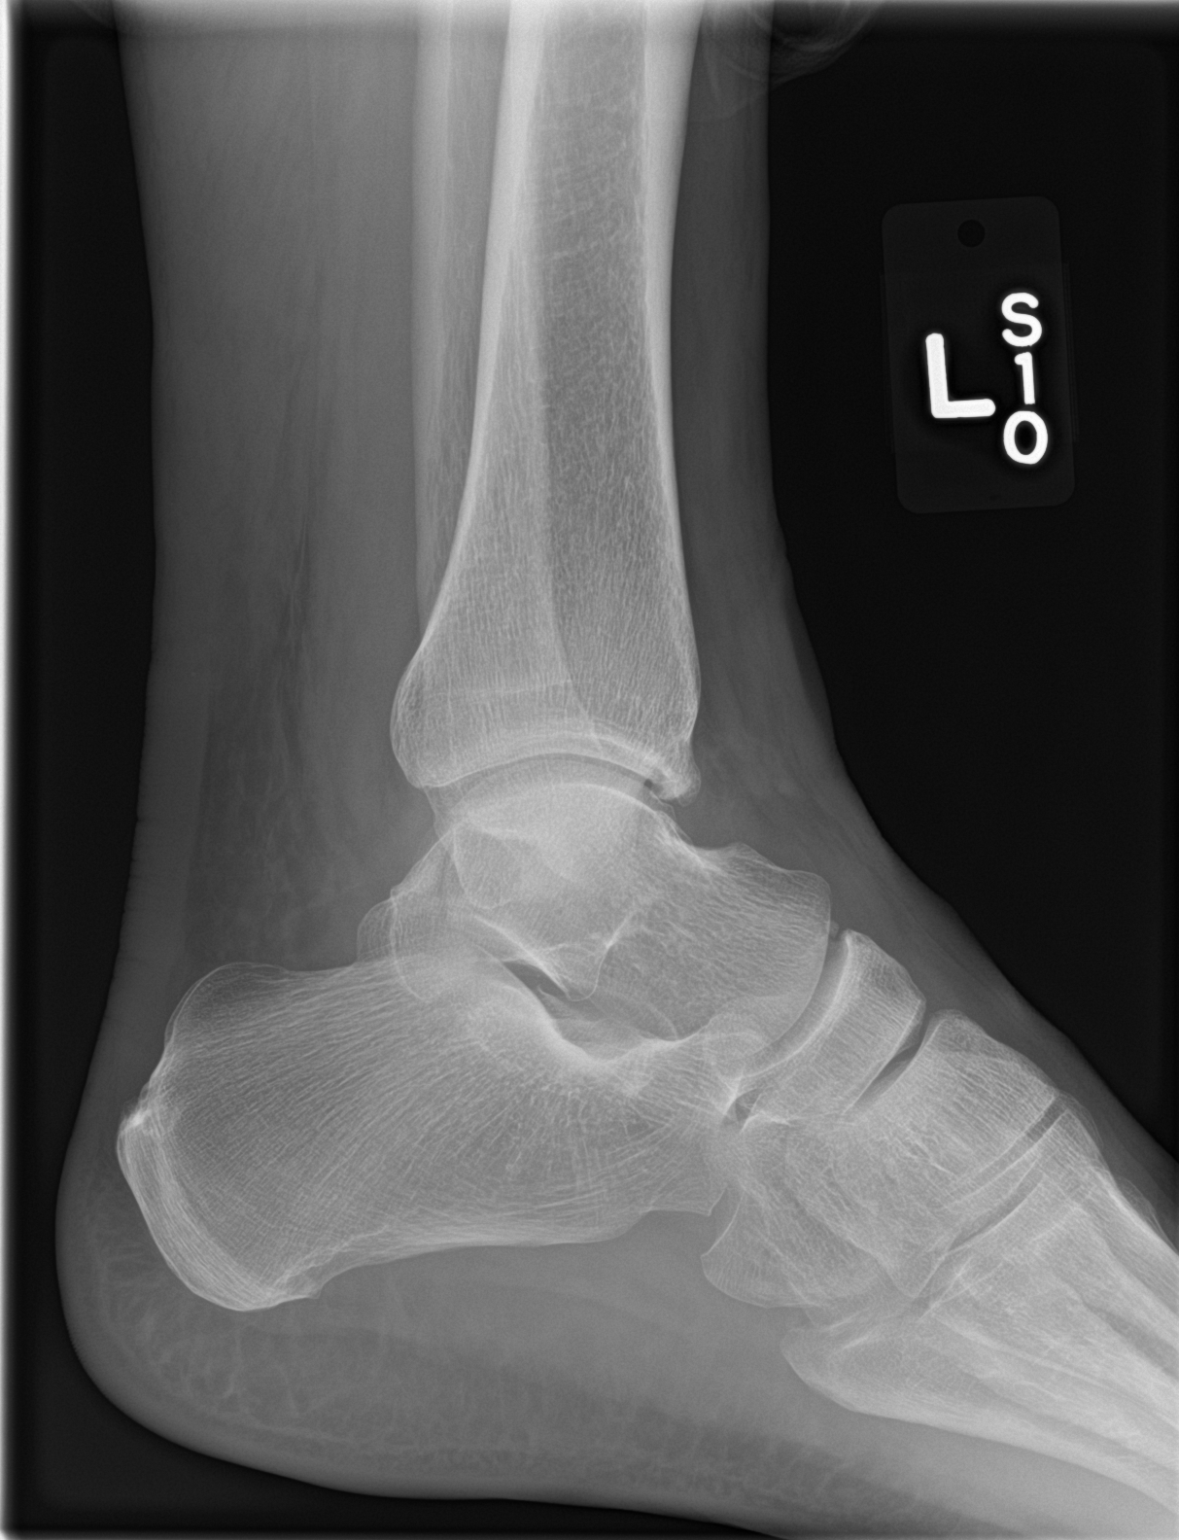

[3 of 3 positions shown; findings below may reference images not displayed]

FINDINGS: No acute bony or joint abnormality identified. No evidence of
fracture dislocation. Diffuse mild degenerative change.
IMPRESSION: Diffuse mild degenerative change.  No acute or focal abnormality.

## 2017-11-16 ENCOUNTER — Other Ambulatory Visit: Payer: Self-pay

## 2017-11-16 ENCOUNTER — Emergency Department: Payer: Self-pay

## 2017-11-16 ENCOUNTER — Encounter: Payer: Self-pay | Admitting: Emergency Medicine

## 2017-11-16 ENCOUNTER — Emergency Department
Admission: EM | Admit: 2017-11-16 | Discharge: 2017-11-16 | Disposition: A | Discharge status: Home / Self Care | Payer: Self-pay | Attending: Emergency Medicine | Admitting: Emergency Medicine

## 2017-11-16 DIAGNOSIS — Z79899 Other long term (current) drug therapy: Secondary | ICD-10-CM | POA: Insufficient documentation

## 2017-11-16 DIAGNOSIS — R079 Chest pain, unspecified: Secondary | ICD-10-CM | POA: Insufficient documentation

## 2017-11-16 DIAGNOSIS — N183 Chronic kidney disease, stage 3 (moderate): Secondary | ICD-10-CM | POA: Insufficient documentation

## 2017-11-16 DIAGNOSIS — M5442 Lumbago with sciatica, left side: Secondary | ICD-10-CM | POA: Insufficient documentation

## 2017-11-16 DIAGNOSIS — F17210 Nicotine dependence, cigarettes, uncomplicated: Secondary | ICD-10-CM | POA: Insufficient documentation

## 2017-11-16 DIAGNOSIS — J449 Chronic obstructive pulmonary disease, unspecified: Secondary | ICD-10-CM | POA: Insufficient documentation

## 2017-11-16 DIAGNOSIS — Z7984 Long term (current) use of oral hypoglycemic drugs: Secondary | ICD-10-CM | POA: Insufficient documentation

## 2017-11-16 DIAGNOSIS — I5022 Chronic systolic (congestive) heart failure: Secondary | ICD-10-CM | POA: Insufficient documentation

## 2017-11-16 DIAGNOSIS — Z7982 Long term (current) use of aspirin: Secondary | ICD-10-CM | POA: Insufficient documentation

## 2017-11-16 DIAGNOSIS — G8929 Other chronic pain: Secondary | ICD-10-CM

## 2017-11-16 DIAGNOSIS — E1122 Type 2 diabetes mellitus with diabetic chronic kidney disease: Secondary | ICD-10-CM | POA: Insufficient documentation

## 2017-11-16 DIAGNOSIS — I130 Hypertensive heart and chronic kidney disease with heart failure and stage 1 through stage 4 chronic kidney disease, or unspecified chronic kidney disease: Secondary | ICD-10-CM | POA: Insufficient documentation

## 2017-11-16 LAB — CBC
HCT: 47.9 % (ref 40.0–52.0)
Hemoglobin: 16.5 g/dL (ref 13.0–18.0)
MCH: 29.1 pg (ref 26.0–34.0)
MCHC: 34.5 g/dL (ref 32.0–36.0)
MCV: 84.4 fL (ref 80.0–100.0)
Platelets: 185 10*3/uL (ref 150–440)
RBC: 5.67 MIL/uL (ref 4.40–5.90)
RDW: 13.9 % (ref 11.5–14.5)
WBC: 7.4 10*3/uL (ref 3.8–10.6)

## 2017-11-16 LAB — BASIC METABOLIC PANEL
Anion gap: 10 (ref 5–15)
BUN: 13 mg/dL (ref 6–20)
CO2: 22 mmol/L (ref 22–32)
Calcium: 9.2 mg/dL (ref 8.9–10.3)
Chloride: 102 mmol/L (ref 101–111)
Creatinine, Ser: 0.98 mg/dL (ref 0.61–1.24)
GFR calc Af Amer: >60 mL/min (ref >60)
GFR calc non Af Amer: >60 mL/min (ref >60)
Glucose, Bld: 444 mg/dL — ABNORMAL HIGH (ref 65–99)
Potassium: 3.8 mmol/L (ref 3.5–5.1)
Sodium: 134 mmol/L — ABNORMAL LOW (ref 135–145)

## 2017-11-16 LAB — TROPONIN I: Troponin I: 0.06 ng/mL (ref <0.03)

## 2017-11-16 MED ORDER — GABAPENTIN 300 MG PO CAPS
300.0000 mg | ORAL_CAPSULE | Freq: Once | ORAL | Status: AC
  Administered 2017-11-16: 300 mg via ORAL
  Filled 2017-11-16: qty 1

## 2017-11-16 MED ORDER — ALBUTEROL SULFATE HFA 108 (90 BASE) MCG/ACT IN AERS: 2.0000 | INHALATION_SPRAY | Freq: Four times a day (QID) | RESPIRATORY_TRACT | 2 refills | Status: DC | PRN

## 2017-11-16 MED ORDER — IPRATROPIUM-ALBUTEROL 0.5-2.5 (3) MG/3ML IN SOLN
3.0000 mL | Freq: Once | RESPIRATORY_TRACT | Status: AC
Start: 2017-11-16 — End: 2017-11-16
  Administered 2017-11-16: 3 mL via RESPIRATORY_TRACT
  Filled 2017-11-16: qty 3

## 2017-11-16 MED ORDER — GABAPENTIN 300 MG PO CAPS: 300.0000 mg | ORAL_CAPSULE | Freq: Three times a day (TID) | ORAL | 2 refills | Status: DC

## 2017-11-16 NOTE — ED Notes (Signed)
Dr Archie Balboa in room with pt

## 2017-11-16 NOTE — ED Notes (Signed)
Dr Archie Balboa notified of elevated troponin of 0.06 - no new orders at this time

## 2017-11-16 NOTE — ED Notes (Signed)
Went to clarify the pt last statements and ask him directly "are you having thoughts of wanting to hurt yourself or others" pt responded "you are dang right I meant it - if you cannot give me something to stop the pain, when I leave and go home I will kill myself" - Dr Archie Balboa notified as well as charge nurse

## 2017-11-16 NOTE — ED Notes (Signed)
Pt signed paper copy of discharge 

## 2017-11-16 NOTE — ED Notes (Signed)
Pt demanding that all equipment be removed from him immediately - he stated "and he wants to call himself a damn doctor, this is on him" - pt did agree to wait for pharmacy to send medication

## 2017-11-16 NOTE — Discharge Instructions (Signed)
Please seek medical attention for any high fevers, chest pain, shortness of breath, change in behavior, persistent vomiting, bloody stool or any other new or concerning symptoms.  

## 2017-11-16 NOTE — ED Triage Notes (Signed)
Pt to ED via POV c/o chronic chest pain, pain got worse last night. Pt is also having shortness of breath and numbness in his left leg. Pt denies N/V. Pt in NAD at this time.

## 2017-11-16 NOTE — ED Notes (Signed)
This RN clarified with MD in regards to patient's DI statements made to his RN.  MD clarified that he will continue with discharge and declines an [place] to be completed on this patient.

## 2017-11-16 NOTE — ED Notes (Signed)
Pt c/o bilat chest pain that radiates into back with tingling into left leg - the pain started yesterday and has gotten worse - pt states that he is in pain every day and some days he can tolerate the pain and other days he cannot - he states this has been going on x3 years - c/o shortness of breath and dizziness

## 2017-11-16 NOTE — ED Notes (Signed)
Pharmacy called to send gabapentin

## 2017-11-16 NOTE — ED Provider Notes (Signed)
[facility] Emergency Department Provider Note   ____________________________________________   I have reviewed the triage vital signs and the nursing notes.   HISTORY  Chief Complaint Chest Pain and Shortness of Breath   History limited by: Not Limited   HPI Gabriel Salminen Destin Vinsant. is a 47 y.o. male who presents to the emergency department today because of concern for chronic chest pain and chronic lower back and left leg pain. Patient states that he has had both of these symptoms for a long time. He claims that he comes to the emergency department for the symptoms because he has not been able to establish care with a primary care physician. The patient denies any new aspect to any of his symptoms. The chest pain is located centrally and is accompanied by shortness of breath. He denies that he has any breathing treatments that he can use at his house. The lower back pain is located on the left side and radiates down his left leg. The patient denies any fevers.    Per medical record review patient has a history of chronic chest pain, frequent ER visits.   Past Medical History:  Diagnosis Date  . Chest wall pain, chronic   . Chronic systolic CHF (congestive heart failure) ([place])    a. 03/2015 Echo: EF 45-50%; b. 12/2015 Echo: EF 20-25%; c. 02/2016 Echo: EF 30-35%; d. 11/2016 Echo: EF 40-45%.  . Chronic Troponin Elevation   . CKD (chronic kidney disease), stage III ([place])   . COPD (chronic obstructive pulmonary disease) ([place])   . Diabetes mellitus without complication ([place])   . Hypertension    Resolved since weight loss  . NICM (nonischemic cardiomyopathy) ([place])    a. 03/2015 Echo: EF 45-50%; b. 04/2015 MV: small defect of mild severity in apex 2/2 apical thinning, EF 30-44%;  c. 12/2015 Echo: EF 20-25%; d. 02/2016 Echo: EF 30-35%; e. 11/2016 Echo: EF 40-45%, diff HK, mildly dil Ao root, mild MR, mod dil LA;  f. 12/2016 Cath: LM nl, LAD/Diags/LCX/OMs min irregs, RCA 40p/m/d.   . Non-obstructive CAD (coronary artery disease)    a. 04/2015 low risk MV;  b. 12/2016 Cath: minor irregs in LAD/Diag/LCX/OM, RCA 40p/m/d.  [name] (nonsustained ventricular tachycardia) ([place])    a. 12/2015 noted on tele-->amio;  b. 12/2015 Event monitor: no VT. Atach noted.  . Obesity (BMI 30.0-34.9)   . Psoriasis   . Renal insufficiency   . Syncope    a. 01/2016 - felt to be vasovagal.  . Tobacco abuse     Patient Active Problem List   Diagnosis Date Noted  . Chronic systolic heart failure ([place]) 09/02/2017  . Chest pain 08/24/2017  . Dizziness   . Noncompliance with medications 04/29/2017  . Back pain 03/06/2017  . Tobacco use 12/04/2016  . Gallbladder sludge 12/04/2016  . Coronary artery disease, non-occlusive 03/15/2016  . Atypical chest pain 02/16/2016  . Orthostatic hypotension 01/23/2016  . Hypokalemia 01/23/2016  . Hypomagnesemia 01/23/2016  . Congestive dilated cardiomyopathy ([place]) 01/17/2016  . NSVT (nonsustained ventricular tachycardia) ([place])   . Elevated troponin   . Sepsis ([place]) 01/03/2016  . Diabetes ([place]) 05/17/2015  . Hyperlipidemia 05/17/2015  . COPD (chronic obstructive pulmonary disease) ([place]) 05/17/2015  . HTN (hypertension) 03/31/2015    Past Surgical History:  Procedure Laterality Date  . AMPUTATION    . CARDIAC CATHETERIZATION    . FINGER AMPUTATION     Traumatic  . LEFT HEART CATH AND CORONARY ANGIOGRAPHY N/A 01/06/2017  Procedure: Left Heart Cath and Coronary Angiography;  Surgeon: Wellington Hampshire, MD;  Location: [facility];  Service: Cardiovascular;  Laterality: N/A;    Prior to Admission medications   Medication Sig Start Date End Date Taking? Authorizing Provider  amLODipine (NORVASC) 5 MG tablet Take 1 tablet (5 mg total) daily by mouth. 06/30/17 06/30/18  Eula Listen, MD  aspirin 81 MG chewable tablet Chew 1 tablet (81 mg total) by mouth daily. Reported on 02/08/2016 06/02/17   Alisa Graff, FNP  metFORMIN  (GLUCOPHAGE) 500 MG tablet Take 1 tablet by mouth 2 (two) times daily.    [provider]  metoprolol succinate (TOPROL-XL) 50 MG 24 hr tablet Take 50 mg by mouth daily. Take with or immediately following a meal.    [provider]  polyethylene glycol (MIRALAX / GLYCOLAX) packet Take 17 g by mouth daily. 10/01/17   Vaughan Basta, MD  potassium chloride (K-DUR) 10 MEQ tablet Take 1 tablet (10 mEq total) daily by mouth. 07/07/17   Gollan, Kathlene November, MD  ranitidine (ZANTAC) 150 MG tablet Take 1 tablet (150 mg total) by mouth 2 (two) times daily. 06/04/17   Alisa Graff, FNP  rosuvastatin (CRESTOR) 20 MG tablet Take 20 mg by mouth daily.    [provider]  sacubitril-valsartan (ENTRESTO) 49-51 MG Take 1 tablet 2 (two) times daily by mouth. 06/27/17   Minna Merritts, MD  spironolactone (ALDACTONE) 25 MG tablet Take 1 tablet (25 mg total) by mouth daily. 06/02/17   Alisa Graff, FNP  torsemide (DEMADEX) 10 MG tablet Take 2 tablets (20 mg total) by mouth daily. 10/01/17   Vaughan Basta, MD    Allergies Prednisone  Family History  Problem Relation Age of Onset  . Diabetes Mellitus II Mother   . Hypertension Father   . Cancer Paternal Aunt   . Cancer Maternal Grandfather     Social History Social History   Tobacco Use  . Smoking status: Current Some Day Smoker    Years: 33.00    Types: Cigarettes  . Smokeless tobacco: Never Used  . Tobacco comment: one cigarette in the past week  Substance Use Topics  . Alcohol use: No    Alcohol/week: 0.0 oz    Frequency: Never    Comment: occassionally  . Drug use: No    Review of Systems Constitutional: No fever/chills Eyes: No visual changes. ENT: No sore throat. Cardiovascular: Positive for chest pain. Respiratory: Positive for shortness of breath. Gastrointestinal: No abdominal pain.  No nausea, no vomiting.  No diarrhea.   Genitourinary: Negative for dysuria. Musculoskeletal: Positive  for back pain and left leg pain. Skin: Negative for rash. Neurological: Negative for headaches, focal weakness or numbness.  ____________________________________________   PHYSICAL EXAM:  VITAL SIGNS: ED Triage Vitals  Enc Vitals Group     BP 11/16/17 1515 (!) 169/102     Pulse Rate 11/16/17 1515 86     Resp 11/16/17 1515 16     Temp 11/16/17 1515 97.9 F (36.6 C)     Temp Source 11/16/17 1515 Oral     SpO2 11/16/17 1515 96 %     Weight 11/16/17 1513 251 lb (113.9 kg)     Height 11/16/17 1513 6' (1.829 m)     Head Circumference --      Peak Flow --      Pain Score 11/16/17 1513 10   Constitutional: Alert and oriented. Well appearing and in no distress. Eyes: Conjunctivae  are normal.  ENT   Head: Normocephalic and atraumatic.   Nose: No congestion/rhinnorhea.   Mouth/Throat: Mucous membranes are moist.   Neck: No stridor. Hematological/Lymphatic/Immunilogical: No cervical lymphadenopathy. Cardiovascular: Normal rate, regular rhythm.  No murmurs, rubs, or gallops.  Respiratory: Normal respiratory effort without tachypnea nor retractions. Breath sounds are clear and equal bilaterally. No wheezes/rales/rhonchi. Gastrointestinal: Soft and non tender. No rebound. No guarding.  Genitourinary: Deferred Musculoskeletal: Normal range of motion in all extremities. No lower extremity edema. Neurologic:  Normal speech and language. No gross focal neurologic deficits are appreciated.  Skin:  Skin is warm, dry and intact. No rash noted. Psychiatric: Mood and affect are normal. Speech and behavior are normal. Patient exhibits appropriate insight and judgment.  ____________________________________________    LABS (pertinent positives/negatives)  Trop 0.06 CBC wbc 7.4, hgb 16.5, plt 185 BMP na 134, glu 444 ____________________________________________   EKG  I, Nance Pear, attending physician, personally viewed and interpreted this EKG  EKG Time: 1513 Rate:  90 Rhythm: normal sinus rhythm Axis: normal Intervals: qtc 474 QRS: q waves v1, v2 ST changes: no st elevation Impression: abnormal ekg  ____________________________________________    RADIOLOGY  CXR No acute findings  ____________________________________________   PROCEDURES  Procedures  ____________________________________________   INITIAL IMPRESSION / ASSESSMENT AND PLAN / ED COURSE  Pertinent labs & imaging results that were available during my care of the patient were reviewed by me and considered in my medical decision making (see chart for details).  Patient presented to the emergency department today because of concern for chronic chest pain and lower back and left leg pain. The patient has been seen in the emergency department and been admitted for this complaint recently. Had negative catheterization and was seen by cardiology last month for similar symptoms without any further work up being recommended. The patient did have a slightly elevated troponin however this is consistent with the patient's baseline and he is not complaining of any new symptoms. He does have a history of COPD and CXR is concerning for bronchitis. Will try breathing treatment.   ----------------------------------------- 5:21 PM on 11/16/2017 -----------------------------------------  Had a discussion with the patient after nurse alerted me that he made a comment concerning for thoughts of self harm. What the patient verbalized to me was frustration that no one has been taking him seriously about his left leg pain and that if he did not get any help he would rather not live. I had a discussion with the patient that it would be unlikely that we would be able to provide him complete or long term care for his chronic pain issues. He did feel improvement after the breathing treatment in terms of his chronic chest pain, so I discussed with the patient that we would give him an albuterol inhaler.  Additionally discussed that we would try gabapentin for his leg pain which does sound sciatic in nature. After this discussion patient appeared calmer. Did state that he would be willing to try the gabapentin and expressed desire to go home. I did offer to have psychiatrist speak to patient if he had concerns about depression/SI however he stated he did not feel he needed to speak to a psychiatrist. He was more concerned about his pain and that he did not feel people were adequately addressing it. Again discussed with patient that our goal would not be to completely eradicate the pain but to try to help ease it until he could establish care with a primary care physician. Patient  stated he would be willing to try the gabapentin and inhaler. Encouraged patient to follow up with PCP and establish care for long term management of the patient's chronic symptoms.   ____________________________________________   FINAL CLINICAL IMPRESSION(S) / ED DIAGNOSES  Final diagnoses:  Chronic chest pain  Chronic left-sided low back pain with left-sided sciatica     Note: This dictation was prepared with Dragon dictation. Any transcriptional errors that result from this process are unintentional     Nance Pear, MD 11/16/17 1858

## 2017-11-16 NOTE — ED Notes (Signed)
This nurse came in pt room to give nebulizer and he stated that if the doctor did not fix his pain today that he would fix it as soon as he left the hospital, that he was tired of living like this - Dr Archie Balboa notified

## 2017-11-28 NOTE — Progress Notes (Signed)
Patient ID: Gabriel [name]., male    DOB: 03/07/1971, 47 y.o.   MRN: [id number]  HPI  Gabriel [name] is [name] 47 y/o male with [name] history of HTN, diabetes, COPD, CKD, asthma, current tobacco use and chronic heart failure.   Most recent echo done 04/29/17 showed EF 45-50%. Echo report that was done 11/28/16 and it showed an EF of 40-45% along with mild Gabriel. EF has improved from 30-35% July 2017.  Was in the ED 11/16/17 due to chronic chest/back/leg pain. Medications adjusted and he was released. Admitted 09/30/17 due to chest pain. Cardiology consult was obtained. Diuretic was decreased and he was discharged the following day. Admitted 08/21/17 due to chest pain. Cardiology consult obtained. Started on zpack due to bronchitis symptoms. Elevated troponin thought to be due to demand ischemia. Discharged after 3 days. Was in the ED 07/16/17 due to back pain. Treated and released. Was in the ED 06/30/17 due to HTN. Medication adjusted and he was released.   He presents today for [name] follow-up visit with [name] chief complaint of minimal shortness of breath upon moderate exertion. He describes this as chronic in nature having been present for several years. He has associated fatigue, cough, abdominal distention, chronic back pain, numbness down both thighs, depression and difficulty sleeping. He denies any palpitations, edema, chest pain, dizziness or weight gain. Says that his chronic pain is really getting him down and he hasn't heard anything regarding his charity care application. Has been taking his torsemide every other day due to cramping which has resolved.   Past Medical History:  Diagnosis Date  . Chest wall pain, chronic   . Chronic systolic CHF (congestive heart failure) ([place])    [name]. 03/2015 Echo: EF 45-50%; b. 12/2015 Echo: EF 20-25%; c. 02/2016 Echo: EF 30-35%; d. 11/2016 Echo: EF 40-45%.  . Chronic Troponin Elevation   . CKD (chronic kidney disease), stage III ([place])   . COPD (chronic obstructive pulmonary  disease) ([place])   . Diabetes mellitus without complication ([place])   . Hypertension    Resolved since weight loss  . NICM (nonischemic cardiomyopathy) ([place])    [name]. 03/2015 Echo: EF 45-50%; b. 04/2015 MV: small defect of mild severity in apex 2/2 apical thinning, EF 30-44%;  c. 12/2015 Echo: EF 20-25%; d. 02/2016 Echo: EF 30-35%; e. 11/2016 Echo: EF 40-45%, diff HK, mildly dil Ao root, mild Gabriel, mod dil LA;  f. 12/2016 Cath: LM nl, LAD/Diags/LCX/OMs min irregs, RCA 40p/m/d.  . Non-obstructive CAD (coronary artery disease)    [name]. 04/2015 low risk MV;  b. 12/2016 Cath: minor irregs in LAD/Diag/LCX/OM, RCA 40p/m/d.  [name] (nonsustained ventricular tachycardia) ([place])    [name]. 12/2015 noted on tele-->amio;  b. 12/2015 Event monitor: no VT. Atach noted.  . Obesity (BMI 30.0-34.9)   . Psoriasis   . Renal insufficiency   . Syncope    [name]. 01/2016 - felt to be vasovagal.  . Tobacco abuse    Past Surgical History:  Procedure Laterality Date  . AMPUTATION    . CARDIAC CATHETERIZATION    . FINGER AMPUTATION     Traumatic  . LEFT HEART CATH AND CORONARY ANGIOGRAPHY N/[name] 01/06/2017   Procedure: Left Heart Cath and Coronary Angiography;  Surgeon: Wellington Hampshire, Gabriel [name];  Location: [facility];  Service: Cardiovascular;  Laterality: N/[name];   Family History  Problem Relation Age of Onset  . Diabetes Mellitus II Mother   . Hypertension Father   . Cancer  Paternal Aunt   . Cancer Maternal Grandfather    Social History   Tobacco Use  . Smoking status: Current Some Day Smoker    Years: 33.00    Types: Cigarettes  . Smokeless tobacco: Never Used  . Tobacco comment: one cigarette in the past week  Substance Use Topics  . Alcohol use: No    Alcohol/week: 0.0 oz    Frequency: Never    Comment: occassionally   Allergies  Allergen Reactions  . Prednisone Other (See Comments)    Reaction: Hallucinations    Prior to Admission medications   Medication Sig Start Date End Date Taking? Authorizing Provider   amLODipine (NORVASC) 5 MG tablet Take 1 tablet (5 mg total) daily by mouth. 06/30/17 06/30/18 Yes Gabriel Listen, Gabriel [name]  aspirin 81 MG chewable tablet Chew 1 tablet (81 mg total) by mouth daily. Reported on 02/08/2016 06/02/17  Yes Gabriel Price [name], Gabriel [name]  gabapentin (NEURONTIN) 300 MG capsule Take 1 capsule (300 mg total) by mouth 3 (three) times daily. 11/16/17 11/16/18 Yes Gabriel Pear, Gabriel [name]  metFORMIN (GLUCOPHAGE) 500 MG tablet Take 1 tablet by mouth 2 (two) times daily.   Yes Provider, Historical, Gabriel [name]  metoprolol succinate (TOPROL-XL) 50 MG 24 hr tablet Take 50 mg by mouth daily. Take with or immediately following [name] meal.   Yes Provider, Historical, Gabriel [name]  potassium chloride (K-DUR) 10 MEQ tablet Take 1 tablet (10 mEq total) daily by mouth. 07/07/17  Yes Gabriel [name], Gabriel November, Gabriel [name]  ranitidine (ZANTAC) 150 MG tablet Take 1 tablet (150 mg total) by mouth 2 (two) times daily. 06/04/17  Yes Gabriel [name], Gabriel Kluver [name], Gabriel [name]  rosuvastatin (CRESTOR) 20 MG tablet Take 20 mg by mouth daily.   Yes Provider, Historical, Gabriel [name]  sacubitril-valsartan (ENTRESTO) 49-51 MG Take 1 tablet 2 (two) times daily by mouth. 06/27/17  Yes Gabriel Merritts, Gabriel [name]  spironolactone (ALDACTONE) 25 MG tablet Take 1 tablet (25 mg total) by mouth daily. 06/02/17  Yes Gabriel [name], Gabriel Kluver [name], Gabriel [name]  torsemide (DEMADEX) 10 MG tablet Take 2 tablets (20 mg total) by mouth daily. 10/01/17  Yes Gabriel Basta, Gabriel [name]  albuterol (PROVENTIL HFA;VENTOLIN HFA) 108 (90 Base) MCG/ACT inhaler Inhale 2 puffs into the lungs every 6 (six) hours as needed for wheezing or shortness of breath. Patient not taking: Reported on 12/01/2017 11/16/17   Gabriel Pear, Gabriel [name]  polyethylene glycol [name] / Floria Raveling) packet Take 17 g by mouth daily. Patient not taking: Reported on 12/01/2017 10/01/17   Gabriel Basta, Gabriel [name]    Review of Systems  Constitutional: Positive for fatigue. Negative for appetite change.  HENT: Positive for congestion. Negative for postnasal drip.   Eyes:  Negative.   Respiratory: Positive for cough (chest sore from coughing), chest tightness and shortness of breath.   Cardiovascular: Negative for chest pain, palpitations and leg swelling.  Gastrointestinal: Positive for abdominal distention. Negative for abdominal pain.  Endocrine: Negative.   Genitourinary: Negative.   Musculoskeletal: Positive for back pain. Negative for neck pain.  Skin: Negative.   Allergic/Immunologic: Negative.   Neurological: Positive for numbness (down both legs at times). Negative for dizziness and light-headedness.  Hematological: Negative for adenopathy. Does not bruise/bleed easily.  Psychiatric/Behavioral: Positive for sleep disturbance (due to pain down his legs). Negative for dysphoric mood and suicidal ideas. The patient is nervous/anxious (stress).    Vitals:   12/01/17 0900  BP: (!) 157/92  Pulse: 85  Resp: 18  SpO2: 96%  Weight: 256 lb (116.1 kg)   Wt Readings from Last 3  Encounters:  12/01/17 256 lb (116.1 kg)  11/16/17 251 lb (113.9 kg)  09/30/17 261 lb (118.4 kg)   Lab Results  Component Value Date   CREATININE 0.98 11/16/2017   CREATININE 1.56 (H) 10/01/2017   CREATININE 1.66 (H) 09/30/2017   Physical Exam  Constitutional: He is oriented to person, place, and time. He appears well-developed and well-nourished.  HENT:  Head: Normocephalic and atraumatic.  Face appears swollen  Neck: Normal range of motion. Neck supple. No JVD present.  Cardiovascular: Normal rate and regular rhythm.  Pulmonary/Chest: Effort normal. He has no wheezes. He has no rales. He exhibits no tenderness.  Abdominal: Soft. He exhibits distension. There is no tenderness.  Musculoskeletal: He exhibits tenderness. He exhibits no edema.       Lumbar back: He exhibits decreased range of motion, tenderness and pain. He exhibits no swelling.  Neurological: He is alert and oriented to person, place, and time.  Skin: Skin is warm and dry.  Psychiatric: He has [name] normal  mood and affect. His behavior is normal. Thought content normal.  Nursing note and vitals reviewed.   Assessment & Plan:  1: Chronic heart failure with reduced ejection fraction- - NYHA class II - mildly fluid overloaded with abdominal distention/weight gain - weigh daily and call for an overnight weight gain of >2 pounds or [name] weekly weight gain of >5 pounds.  - weight up 5 pounds from recent ED visit 2 weeks ago - not adding salt and he was encouraged to closely follow [name] 2000mg  sodium diet - saw cardiologist Gabriel [name]) 07/07/17 & returns 01/05/18 - advised patient to take his torsemide daily for [name] few days and to double his potassium while taking daily torsemide due to abdominal distention/weight gain - BNP from 04/28/17 was 25.0 - PharmD went in and reviewed medications with patient  2: HTN- - BP elevated slightly but he's also in chronic pain - checking his BP at home and he says that it's fluctuating - follows with PCP Gabriel [name])  - BMP from 11/16/17 reviewed and showed sodium 134, potassium 3.8 and GFR >60  3: Back pain- - this started months ago and has been chronic ever since. 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  - reports that his chronic back pain is affecting him and causing [name] depression. Feels like no one wants to help him. Does say the gabapentin provides some relief - denies any suicidal thoughts/plans - encouraged him to go back to patient financial services and follow-up regarding his charity care status. If he gets approved, we can then make him [name] referral to pain management  4: Diabetes-   - checking his glucose at home -  A1c 9.2 % on 08/21/17 - fasting glucose at home yesterday was 205 - to follow with PCP Gabriel [name]) - completed some classes at the Gabriel [name]  5:  Tobacco use- - continues to smoke 1ppd of cigarettes - expresses desire to quit; has tried patches, gum etc but without success - complete cessation discussed for 3 minutes with him - will add chantix .5mg  daily for 3 days, then 0.5mg  twice daily for 4 days and then 1mg  twice daily - patient decided on [name] quit date of Dec 29, 2017 - did advise him that if he felt like his depression was worsening after starting the medication, that he would need to  stop taking it. Patient verbalized understanding  Patient did not bring his medications nor [name] list. Each medication was verbally reviewed with the patient and he was encouraged to bring the bottles to every visit to confirm accuracy of list.  Return in 2 months or sooner for any questions/problems before then.

## 2017-12-01 ENCOUNTER — Ambulatory Visit: Payer: Self-pay | Attending: Family | Admitting: Family

## 2017-12-01 ENCOUNTER — Encounter: Payer: Self-pay | Admitting: Family

## 2017-12-01 VITALS — BP 157/92 | HR 85 | Resp 18 | Wt 256.0 lb

## 2017-12-01 DIAGNOSIS — M5442 Lumbago with sciatica, left side: Secondary | ICD-10-CM

## 2017-12-01 DIAGNOSIS — M5441 Lumbago with sciatica, right side: Secondary | ICD-10-CM

## 2017-12-01 DIAGNOSIS — Z89029 Acquired absence of unspecified finger(s): Secondary | ICD-10-CM | POA: Insufficient documentation

## 2017-12-01 DIAGNOSIS — J449 Chronic obstructive pulmonary disease, unspecified: Secondary | ICD-10-CM | POA: Insufficient documentation

## 2017-12-01 DIAGNOSIS — Z888 Allergy status to other drugs, medicaments and biological substances status: Secondary | ICD-10-CM | POA: Insufficient documentation

## 2017-12-01 DIAGNOSIS — G8929 Other chronic pain: Secondary | ICD-10-CM

## 2017-12-01 DIAGNOSIS — Z79899 Other long term (current) drug therapy: Secondary | ICD-10-CM | POA: Insufficient documentation

## 2017-12-01 DIAGNOSIS — Z7982 Long term (current) use of aspirin: Secondary | ICD-10-CM | POA: Insufficient documentation

## 2017-12-01 DIAGNOSIS — I472 Ventricular tachycardia: Secondary | ICD-10-CM | POA: Insufficient documentation

## 2017-12-01 DIAGNOSIS — E119 Type 2 diabetes mellitus without complications: Secondary | ICD-10-CM

## 2017-12-01 DIAGNOSIS — Z7984 Long term (current) use of oral hypoglycemic drugs: Secondary | ICD-10-CM | POA: Insufficient documentation

## 2017-12-01 DIAGNOSIS — E669 Obesity, unspecified: Secondary | ICD-10-CM | POA: Insufficient documentation

## 2017-12-01 DIAGNOSIS — Z8249 Family history of ischemic heart disease and other diseases of the circulatory system: Secondary | ICD-10-CM | POA: Insufficient documentation

## 2017-12-01 DIAGNOSIS — I10 Essential (primary) hypertension: Secondary | ICD-10-CM

## 2017-12-01 DIAGNOSIS — I420 Dilated cardiomyopathy: Secondary | ICD-10-CM | POA: Insufficient documentation

## 2017-12-01 DIAGNOSIS — Z955 Presence of coronary angioplasty implant and graft: Secondary | ICD-10-CM | POA: Insufficient documentation

## 2017-12-01 DIAGNOSIS — N183 Chronic kidney disease, stage 3 (moderate): Secondary | ICD-10-CM | POA: Insufficient documentation

## 2017-12-01 DIAGNOSIS — Z9889 Other specified postprocedural states: Secondary | ICD-10-CM | POA: Insufficient documentation

## 2017-12-01 DIAGNOSIS — I130 Hypertensive heart and chronic kidney disease with heart failure and stage 1 through stage 4 chronic kidney disease, or unspecified chronic kidney disease: Secondary | ICD-10-CM | POA: Insufficient documentation

## 2017-12-01 DIAGNOSIS — E1122 Type 2 diabetes mellitus with diabetic chronic kidney disease: Secondary | ICD-10-CM | POA: Insufficient documentation

## 2017-12-01 DIAGNOSIS — Z6834 Body mass index (BMI) 34.0-34.9, adult: Secondary | ICD-10-CM | POA: Insufficient documentation

## 2017-12-01 DIAGNOSIS — I5022 Chronic systolic (congestive) heart failure: Secondary | ICD-10-CM | POA: Insufficient documentation

## 2017-12-01 DIAGNOSIS — F17210 Nicotine dependence, cigarettes, uncomplicated: Secondary | ICD-10-CM | POA: Insufficient documentation

## 2017-12-01 DIAGNOSIS — Z833 Family history of diabetes mellitus: Secondary | ICD-10-CM | POA: Insufficient documentation

## 2017-12-01 DIAGNOSIS — I2510 Atherosclerotic heart disease of native coronary artery without angina pectoris: Secondary | ICD-10-CM | POA: Insufficient documentation

## 2017-12-01 DIAGNOSIS — Z720 Tobacco use: Secondary | ICD-10-CM

## 2017-12-01 MED ORDER — VARENICLINE TARTRATE 1 MG PO TABS: 1.0000 mg | ORAL_TABLET | Freq: Two times a day (BID) | ORAL | 0 refills | Status: DC

## 2017-12-01 NOTE — Patient Instructions (Addendum)
Continue weighing daily and call for an overnight weight gain of > 2 pounds or a weekly weight gain of >5 pounds.  Begin Chantix 1/2 tablet daily for 3 days then 1/2 tablet twice daily for 4 days and then take 1 tablet twice daily

## 2017-12-03 ENCOUNTER — Other Ambulatory Visit: Payer: Self-pay

## 2017-12-03 ENCOUNTER — Ambulatory Visit: Payer: Self-pay | Admitting: Pharmacist

## 2017-12-03 ENCOUNTER — Encounter: Payer: Self-pay | Admitting: Pharmacist

## 2017-12-03 VITALS — Wt 253.0 lb

## 2017-12-03 DIAGNOSIS — Z79899 Other long term (current) drug therapy: Secondary | ICD-10-CM

## 2017-12-03 NOTE — Progress Notes (Addendum)
Medication Management Clinic Visit Note  Patient: Gabriel [name]. MRN: [id number] Date of Birth: 04/09/1971 PCP: Alisa Graff, FNP   [name] 47 y.o. male presents for a medication therapy managment visit today.  Wt 253 lb (114.8 kg)   BMI 34.31 kg/m  BP was not assessed due to cuff malfunction.   Patient Information   Past Medical History:  Diagnosis Date  . Chest wall pain, chronic   . Chronic systolic CHF (congestive heart failure) ([place])    a. 03/2015 Echo: EF 45-50%; b. 12/2015 Echo: EF 20-25%; c. 02/2016 Echo: EF 30-35%; d. 11/2016 Echo: EF 40-45%.  . Chronic Troponin Elevation   . CKD (chronic kidney disease), stage III ([place])   . COPD (chronic obstructive pulmonary disease) ([place])   . Diabetes mellitus without complication ([place])   . Hypertension    Resolved since weight loss  . NICM (nonischemic cardiomyopathy) ([place])    a. 03/2015 Echo: EF 45-50%; b. 04/2015 MV: small defect of mild severity in apex 2/2 apical thinning, EF 30-44%;  c. 12/2015 Echo: EF 20-25%; d. 02/2016 Echo: EF 30-35%; e. 11/2016 Echo: EF 40-45%, diff HK, mildly dil Ao root, mild MR, mod dil LA;  f. 12/2016 Cath: LM nl, LAD/Diags/LCX/OMs min irregs, RCA 40p/m/d.  . Non-obstructive CAD (coronary artery disease)    a. 04/2015 low risk MV;  b. 12/2016 Cath: minor irregs in LAD/Diag/LCX/OM, RCA 40p/m/d.  [name] (nonsustained ventricular tachycardia) ([place])    a. 12/2015 noted on tele-->amio;  b. 12/2015 Event monitor: no VT. Atach noted.  . Obesity (BMI 30.0-34.9)   . Psoriasis   . Renal insufficiency   . Syncope    a. 01/2016 - felt to be vasovagal.  . Tobacco abuse       Past Surgical History:  Procedure Laterality Date  . AMPUTATION    . CARDIAC CATHETERIZATION    . FINGER AMPUTATION     Traumatic  . LEFT HEART CATH AND CORONARY ANGIOGRAPHY N/A 01/06/2017   Procedure: Left Heart Cath and Coronary Angiography;  Surgeon: Wellington Hampshire, MD;  Location: [facility];  Service:  Cardiovascular;  Laterality: N/A;     Family History  Problem Relation Age of Onset  . Diabetes Mellitus II Mother   . Hypertension Father   . Cancer Paternal Aunt   . Cancer Maternal Grandfather     New Diagnoses (since last visit): NA  Family Support: Good, stays with dad or girlfriend   Lifestyle  Exercise: does not currently exercise due to back pain  Diet: Breakfast: meat with egg on soft taco shell, with fruit Lunch: can of tuna or chicken breast on soft taco shell Dinner: meat with sides, whatever girl friend makes  Drinks: unsweet tea, water and coffee   Sodium intake: doesn't add extra salt to foods but does have canned foods. Goal <2,000 mg daily.           Social History   Substance and Sexual Activity  Alcohol Use No  . Alcohol/week: 0.0 oz  . Frequency: Never   Comment: occassionally     Social History   Tobacco Use  Smoking Status Current Every Day Smoker  . Packs/day: 1.00  . Years: 33.00  . Pack years: 33.00  . Types: Cigarettes  Smokeless Tobacco Never Used  Tobacco Comment   one cigarette in the past week     Health Maintenance  Topic Date Due  . FOOT EXAM  01/31/1979  . OPHTHALMOLOGY  EXAM  01/31/1979  . URINE MICROALBUMIN  01/31/1979  . TETANUS/TDAP  01/31/1988  . HEMOGLOBIN A1C  02/18/2018  . INFLUENZA VACCINE  03/19/2018  . PNEUMOCOCCAL POLYSACCHARIDE VACCINE (2) 01/05/2021  . HIV Screening  Completed    Assessment and Plan:  Medication Compliance: Patient is able to correctly state the 3 W's (what, when, why) of his medications with minimal prompting. States that he uses a pill box and rarely misses doses. He is requesting refills for 2 of his medications which we have filled for him today.  Heart Failure: Patient currently taking metoprolol, Entresto, spironolactone and torsemide. States that he loves being on [place] because he feels it has kept him from having further complications. States that he feels that his symptoms  are well controlled and medications are well tolerated.  DM: Patient currently taking metformin, well tolerated. States that his FBGs are usually ~205 mg/dL, and his 2 hour PPGs are ~250 mg/dL. Has eliminated sugar sweetened beverages in effort to lower BG and A1c.    HTN: Patient currently taking amlodipine, metoprolol, valsartan (Entresto), and spironolactone. BP not assessed today due to cuff malfunction. Patient does not currently monitor BP at home but states that he feels it has been running high lately due to pain.   COPD: Patient was recently prescribed an albuterol inhaler but states he was only able to use it once because it gave him a severe headache. States that he has never been on a controller inhaler. Suggested that he ask about a maintenance/controller inhaler to reduce his need for a rescue inhaler at his next PCP visit.   Smoking Cessation: Congratulated patient for setting a quit date in May. He will start Chantix today. Advised him to call the [facility] if he has any questions after starting the Chantix.   Back/Leg Pain: Patient currently taking gabapentin and states that he has seen some improvement since starting it ~2 weeks ago. States that nothing else has seemed to help relieve the pain.   Other: Patient expressed frustration regarding his charity care application. States that he can not establish care with pain management until his application is approved but has not been able to get an update on his application. Provided him with a number to call to receive the most up to date status of the application and to determine if any other application materials are needed to process his application.    Lewie Loron, PharmD Candidate  Cosigned:Christan Helene Kelp, PharmD, [facility]) [phone]

## 2017-12-04 ENCOUNTER — Other Ambulatory Visit: Payer: Self-pay | Admitting: Family

## 2017-12-04 MED ORDER — AMLODIPINE BESYLATE 5 MG PO TABS: 5.0000 mg | ORAL_TABLET | Freq: Every day | ORAL | 3 refills | Status: DC

## 2018-01-03 NOTE — Progress Notes (Signed)
Patient ID: Gabriel Mancil., male   DOB: March 14, 1971, 47 y.o.   MRN: [id number] Cardiology Office Note  Date:  01/05/2018   ID:  Gabriel Righter., DOB 10-07-1970, MRN [id number]  PCP:  Alisa Graff, FNP   Chief Complaint  Patient presents with  . Other    6 month follow up. Patient c/o SOB. Meds reviewed verbally with patient.     HPI:  47 y.o. male with h/o  nonischemic cardiomyopathy by nuclear stress test in 2016, History of medication noncompliance chronic systolic CHF, ejection fraction 30-35%, up to 45% - 50% COPD,  ongoing tobacco abuse at 2 ppd since age 21,  DM2,  obesity  chronically elevated troponins given his chronic renal insufficiency. [place] on 01/03/16 with cough and SOB, septic shock, right upper lobe pneumonia,  He presents today for follow-up of his nonischemic cardiomyopathy  Seen in the emergency room 11/16/2017 Chronic chest pain shortness of breath Low back pain pain radiating down left leg Normal renal function Emergency room told him that they do not do outpatient workup and do not provide pain medication  has a lawyer for disability Reports is unable to do anything gets winded and tired with exertion  Reports he has all of his medications Blood pressure somewhat better at home than it is in the office Typically running 150 in the office on several office visits Reports recent compliance with his medications  He did not complete cardiac rehabilitation her pulmonary rehabilitation Does not have a job, no money Father providing for everything per the patient Reports that he filled out some paperwork for the hospital and has not heard anything   was seen in the emergency room June 30, 2017 for hypertension Med noncompliant at the time  Chronic pain issues Reports that he quit smoking one month ago  HBA1C 7.1 Weight continues to run high  EKG personally reviewed by myself on todays visit Shows normal sinus rhythm with rate 89 bpm no  significant ST or T-wave changes  EKG personally reviewed by myself on todays visit, prior EKG from the emergency room Shows normal sinus rhythm  Other past medical history reviewed In hospital 04/2017 Hospital records reviewed with the patient in detail orthostatic lightheadedness in the setting of acute kidney injury. Creatinine 2.6 intravascular volume depletion Meds: Entresto,  torsemide to 40 mg daily,  metoprolol succinate to 50 mg daily. Spironolactone held  LVEF, now 45-50%.  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  does not have insurance, lives with his father Previously did not fill out paperwork for medical management/assistance with his medications as he does not want to give his father's information Currently unemployed Reports he is trying to [place], get disability Followed at [facility]  hospital 02/17/2016, admitted for chest pain/pneumonia He was put back on several medications for cardiomyopathy and blood pressure  Reports having sleep study in the past, was positive, did not tolerate CPAP.  Does not want to wear that, feels he sleeps fine  On prior office visit,  going to the pool with his family, doing various activities Does not feel that he can work, has bills to pay, has not worked Does not have money for medications  No episodes of syncope  Previous echocardiogram reviewed with him showing improving ejection fraction up to 30-35%, previously 25% in the setting of sepsis   Echocardiogram in the hospital  in  the setting of sepsis showed severely depressed ejection fraction 25% CXR showed right upper lobe consolidation c/w pneumonia.  started on IV Rocephin and azithromycin.  Troponin trend of 0.22-->0.19-->0.18-->1.05-->0.13 WBC count of 28,000 on arrival Febrile to a T max of 101.2 this admission.  Hypotensive with BP down to the 70's shortly after admission,   Prior to discharge was started on hydralazine, isosorbide, carvedilol. Secondary to acute renal failure, was not started on ACE inhibitor or ARB He did have short runs of nonsustained VT seen on telemetry   nonsustained VT on Holter, started on amiodarone, was asymptomatic  Early June 2017, had episode of syncope in his kitchen Denied any lightheadedness or dizziness, no warning, When out for a minute witnessed by his girlfriend, woke up on the floor Went to the hospital  He wore a 48-hour Holter monitor which was reviewed with him in detail today Monitor shows short rare runs of atrial tachycardia, also short rare episodes of nonsustained VT. He does report rare fluttering in his chest like a vibration otherwise no significant symptoms  Patient was previously admitted in August 2016 and September 2016 with atypical chest pain.  Echo during August admission showed mildly reduced LV systolic function with mildly elevated troponin and BNP. He was hypertensive and unable to afford his medications at that time.   He was discharged back in August 2016 on antihypertensives, but did nto take them. During his September 2016 admission for atypical chest pain and HTN he underwent nuclear stress testing that showed a small defect of mild severity in the apex location felt to be 2/2 apical thinning. EF was estimated at 30-44%. Overall, this was an intermediate risk scan. There was no evidence of significant perfusion abnormalities and he was felt to have NICM.   PMH:   has a past medical history of Chest wall pain, chronic, Chronic systolic  CHF (congestive heart failure) (HCC), Chronic Troponin Elevation, CKD (chronic kidney disease), stage III ([place]), COPD (chronic obstructive pulmonary disease) ([place]), Diabetes mellitus without complication ([place]), Hypertension, NICM (nonischemic cardiomyopathy) ([place]), Non-obstructive CAD (coronary artery disease), NSVT (nonsustained ventricular tachycardia) ([place]), Obesity (BMI 30.0-34.9), Psoriasis, Renal insufficiency, Syncope, and Tobacco abuse.  PSH:    Past Surgical History:  Procedure Laterality Date  . AMPUTATION    . CARDIAC CATHETERIZATION    . FINGER AMPUTATION     Traumatic  . LEFT HEART CATH AND CORONARY ANGIOGRAPHY N/A 01/06/2017   Procedure: Left Heart Cath and Coronary Angiography;  Surgeon: Wellington Hampshire, MD;  Location: [facility];  Service: Cardiovascular;  Laterality: N/A;    Current Outpatient Medications  Medication Sig Dispense Refill  . aspirin 81 MG chewable tablet Chew 1 tablet (81 mg total) by mouth daily. Reported on 02/08/2016 90 tablet 3  . gabapentin (NEURONTIN) 300 MG capsule Take 1 capsule (300 mg total) by mouth 3 (three) times daily. 90 capsule 2  . metFORMIN (GLUCOPHAGE) 500 MG tablet Take 1 tablet by mouth 2 (two) times daily.    . metoprolol succinate (TOPROL-XL) 50 MG 24 hr tablet Take 1 tablet (50 mg total) by mouth 2 (two) times  daily. Take with or immediately following a meal. 180 tablet 3  . potassium chloride (K-DUR) 10 MEQ tablet Take 1 tablet (10 mEq total) daily by mouth. 30 tablet 6  . ranitidine (ZANTAC) 150 MG tablet Take 1 tablet (150 mg total) by mouth 2 (two) times daily. 180 tablet 3  . rosuvastatin (CRESTOR) 20 MG tablet Take 20 mg by mouth daily.    Marland Kitchen spironolactone (ALDACTONE) 25 MG tablet Take 1 tablet (25 mg total) by mouth daily. 90 tablet 3  . torsemide (DEMADEX) 20 MG tablet Take 1 tablet (20 mg total) by mouth daily. 90 tablet 3  . varenicline (CHANTIX CONTINUING MONTH PAK) 1 MG tablet Take 1 tablet (1 mg total) by mouth 2  (two) times daily. Take 1/2 tablet daily for 3 days, then 1/2 tablet twice daily for 4 days 180 tablet 0  . sacubitril-valsartan (ENTRESTO) 97-103 MG Take 1 tablet by mouth 2 (two) times daily. 60 tablet 11   No current facility-administered medications for this visit.      Allergies:   Prednisone   Social History:  The patient  reports that he has been smoking Cigarettes.  He has been smoking about 2.00 packs per day for the past 33 years. He has never used smokeless tobacco. He reports that he drinks about 1.8 oz of alcohol per week. He reports that he does not use illicit drugs.   Family History:   family history includes Cancer in his maternal grandfather and paternal aunt; Diabetes Mellitus II in his mother; Hypertension in his father.    Review of Systems: Review of Systems  Constitutional: Negative.   Respiratory: Positive for shortness of breath.   Cardiovascular: Negative.   Gastrointestinal: Negative.   Musculoskeletal: Positive for back pain.  Psychiatric/Behavioral: Negative.   All other systems reviewed and are negative.    PHYSICAL EXAM: VS:  BP (!) 150/100 (BP Location: Left Arm, Patient Position: Sitting, Cuff Size: Large)   Pulse 89   Ht 6' (1.829 m)   Wt 257 lb (116.6 kg)   BMI 34.86 kg/m  , BMI Body mass index is 34.86 kg/m. Constitutional:  oriented to person, place, and time. No distress. obese HENT:  Head: Normocephalic and atraumatic.  Eyes:  no discharge. No scleral icterus.  Neck: Normal range of motion. Neck supple. No JVD present.  Cardiovascular: Normal rate, regular rhythm, normal heart sounds and intact distal pulses. Exam reveals no gallop and no friction rub. No edema No murmur heard. Pulmonary/Chest: Effort normal and breath sounds normal. No stridor. No respiratory distress.  no wheezes.  no rales.  no tenderness.  Abdominal: Soft.  no distension.  no tenderness.  Musculoskeletal: Normal range of motion.  no  tenderness or deformity.   Neurological:  normal muscle tone. Coordination normal. No atrophy Skin: Skin is warm and dry. No rash noted. not diaphoretic.  Psychiatric:  normal mood and affect. behavior is normal. Thought content normal.    Recent Labs: 04/28/2017: B Natriuretic Peptide 25.0 09/30/2017: ALT 32 10/01/2017: Magnesium 1.5; TSH 2.456 11/16/2017: BUN 13; Creatinine, Ser 0.98; Hemoglobin 16.5; Platelets 185; Potassium 3.8; Sodium 134    Lipid Panel Lab Results  Component Value Date   CHOL 74 01/04/2016   HDL 14 (L) 01/04/2016   LDLCALC 46 01/04/2016   TRIG 71 01/04/2016      Wt Readings from Last 3 Encounters:  01/05/18 257 lb (116.6 kg)  12/03/17 253 lb (114.8 kg)  12/01/17 256 lb (116.1 kg)  ASSESSMENT AND PLAN:  Syncope, unspecified syncope type No further episodes of syncope  We'll continue metoprolol increased up to twice a day dosing for blood pressure  Cardiomyopathy ([place]) - Prior history of medication noncompliance, inability to afford his medications  Tolerating Entresto and metoprolol succinate Doses have been increased Most recent ejection fraction 45% to 50%, normal being 55%  deconditioned, obese. Recommended he start a exercise program, start a walking program  Essential hypertension We will increase the metoprolol and entresto  stop the amlodipine in the setting of systolic dysfunction Continue torsemide daily Otherwise relatively stable  Chronic systolic heart failure (HCC) Taking torsemide 20 daily Potassium daily with Aldactone  Overall stable Ejection fraction dramatically improved on his medications up to 45-50% Shortness of breath symptoms likely from deconditioning and obesity Recommended alcohol cessation  oronary artery disease without angina Previous cardiac catheterization with nonobstructive disease,  unrelated to his cardiomyopathy Cardiopathy has since dramatically improved No further workup needed Stable, he has quit smoking    Total  encounter time more than 25 minutes  Greater than 50% was spent in counseling and coordination of care with the patient    Orders Placed This Encounter  Procedures  . EKG 12-Lead     Signed, Esmond Plants, M.D., Ph.D. 01/05/2018  [place], [place]

## 2018-01-05 ENCOUNTER — Ambulatory Visit (INDEPENDENT_AMBULATORY_CARE_PROVIDER_SITE_OTHER): Payer: Self-pay | Admitting: Cardiovascular Disease

## 2018-01-05 ENCOUNTER — Encounter: Payer: Self-pay | Admitting: Cardiovascular Disease

## 2018-01-05 VITALS — BP 150/100 | HR 89 | Ht 72.0 in | Wt 257.0 lb

## 2018-01-05 DIAGNOSIS — I5022 Chronic systolic (congestive) heart failure: Secondary | ICD-10-CM

## 2018-01-05 DIAGNOSIS — E782 Mixed hyperlipidemia: Secondary | ICD-10-CM

## 2018-01-05 DIAGNOSIS — E119 Type 2 diabetes mellitus without complications: Secondary | ICD-10-CM

## 2018-01-05 DIAGNOSIS — I4729 Other ventricular tachycardia: Secondary | ICD-10-CM

## 2018-01-05 DIAGNOSIS — J439 Emphysema, unspecified: Secondary | ICD-10-CM

## 2018-01-05 DIAGNOSIS — Z720 Tobacco use: Secondary | ICD-10-CM

## 2018-01-05 DIAGNOSIS — R0789 Other chest pain: Secondary | ICD-10-CM

## 2018-01-05 DIAGNOSIS — I10 Essential (primary) hypertension: Secondary | ICD-10-CM

## 2018-01-05 DIAGNOSIS — I472 Ventricular tachycardia: Secondary | ICD-10-CM

## 2018-01-05 MED ORDER — METOPROLOL SUCCINATE ER 50 MG PO TB24: 50.0000 mg | ORAL_TABLET | Freq: Two times a day (BID) | ORAL | 3 refills | Status: DC

## 2018-01-05 MED ORDER — SACUBITRIL-VALSARTAN 97-103 MG PO TABS: 1.0000 | ORAL_TABLET | Freq: Two times a day (BID) | ORAL | 11 refills | Status: DC

## 2018-01-05 MED ORDER — TORSEMIDE 20 MG PO TABS
20.0000 mg | ORAL_TABLET | Freq: Every day | ORAL | 3 refills | Status: DC
Start: 2018-01-05 — End: 2018-01-09

## 2018-01-05 NOTE — Patient Instructions (Addendum)
Medication Instructions:  Your physician has recommended you make the following change in your medication:  1. INCREASE Entresto 97/103 mg twice a day 2. INCREASE Metoprolol up to twice a day 3. CONTINUE Torsemide 20 mg once daily 4. STOP Amlodipine    Labwork:  No new labs needed  Testing/Procedures:  No further testing at this time   Follow-Up: It was a pleasure seeing you in the office today. Please call us if you have new issues that need to be addressed before your next appt.  [phone]  Your physician wants you to follow-up in: 12 months.  You will receive a reminder letter in the mail two months in advance. If you don't receive a letter, please call our office to schedule the follow-up appointment.  If you need a refill on your cardiac medications before your next appointment, please call your pharmacy.  For educational health videos Log in to : [email or web address] Or : [email or web address], password : [email or web address]

## 2018-01-09 ENCOUNTER — Emergency Department
Admission: EM | Admit: 2018-01-09 | Discharge: 2018-01-09 | Disposition: A | Discharge status: Home / Self Care | Payer: Self-pay | Attending: Emergency Medicine | Admitting: Emergency Medicine

## 2018-01-09 ENCOUNTER — Encounter: Payer: Self-pay | Admitting: Emergency Medicine

## 2018-01-09 ENCOUNTER — Other Ambulatory Visit: Payer: Self-pay

## 2018-01-09 DIAGNOSIS — Z91199 Patient's noncompliance with other medical treatment and regimen due to unspecified reason: Secondary | ICD-10-CM

## 2018-01-09 DIAGNOSIS — I2510 Atherosclerotic heart disease of native coronary artery without angina pectoris: Secondary | ICD-10-CM | POA: Insufficient documentation

## 2018-01-09 DIAGNOSIS — E860 Dehydration: Secondary | ICD-10-CM | POA: Insufficient documentation

## 2018-01-09 DIAGNOSIS — Z87891 Personal history of nicotine dependence: Secondary | ICD-10-CM | POA: Insufficient documentation

## 2018-01-09 DIAGNOSIS — E1122 Type 2 diabetes mellitus with diabetic chronic kidney disease: Secondary | ICD-10-CM | POA: Insufficient documentation

## 2018-01-09 DIAGNOSIS — N183 Chronic kidney disease, stage 3 (moderate): Secondary | ICD-10-CM | POA: Insufficient documentation

## 2018-01-09 DIAGNOSIS — Z7982 Long term (current) use of aspirin: Secondary | ICD-10-CM | POA: Insufficient documentation

## 2018-01-09 DIAGNOSIS — I5022 Chronic systolic (congestive) heart failure: Secondary | ICD-10-CM | POA: Insufficient documentation

## 2018-01-09 DIAGNOSIS — R739 Hyperglycemia, unspecified: Secondary | ICD-10-CM | POA: Insufficient documentation

## 2018-01-09 DIAGNOSIS — Z79899 Other long term (current) drug therapy: Secondary | ICD-10-CM | POA: Insufficient documentation

## 2018-01-09 DIAGNOSIS — Z7984 Long term (current) use of oral hypoglycemic drugs: Secondary | ICD-10-CM | POA: Insufficient documentation

## 2018-01-09 DIAGNOSIS — R197 Diarrhea, unspecified: Secondary | ICD-10-CM | POA: Insufficient documentation

## 2018-01-09 DIAGNOSIS — Z9119 Patient's noncompliance with other medical treatment and regimen: Secondary | ICD-10-CM | POA: Insufficient documentation

## 2018-01-09 DIAGNOSIS — J449 Chronic obstructive pulmonary disease, unspecified: Secondary | ICD-10-CM | POA: Insufficient documentation

## 2018-01-09 DIAGNOSIS — I130 Hypertensive heart and chronic kidney disease with heart failure and stage 1 through stage 4 chronic kidney disease, or unspecified chronic kidney disease: Secondary | ICD-10-CM | POA: Insufficient documentation

## 2018-01-09 LAB — CBC
HCT: 51.3 % (ref 40.0–52.0)
Hemoglobin: 18.4 g/dL — ABNORMAL HIGH (ref 13.0–18.0)
MCH: 29.8 pg (ref 26.0–34.0)
MCHC: 35.8 g/dL (ref 32.0–36.0)
MCV: 83.2 fL (ref 80.0–100.0)
Platelets: 215 10*3/uL (ref 150–440)
RBC: 6.17 MIL/uL — ABNORMAL HIGH (ref 4.40–5.90)
RDW: 13.6 % (ref 11.5–14.5)
WBC: 10.8 10*3/uL — ABNORMAL HIGH (ref 3.8–10.6)

## 2018-01-09 LAB — URINALYSIS, COMPLETE (UACMP) WITH MICROSCOPIC
Bacteria, UA: NONE SEEN
Bilirubin Urine: NEGATIVE
Glucose, UA: >=500 mg/dL — AB
Ketones, ur: 5 mg/dL — AB
Leukocytes, UA: NEGATIVE
Nitrite: NEGATIVE
Protein, ur: 100 mg/dL — AB
Specific Gravity, Urine: 1.026 (ref 1.005–1.030)
Squamous Epithelial / HPF: NONE SEEN (ref 0–5)
pH: 6 (ref 5.0–8.0)

## 2018-01-09 LAB — COMPREHENSIVE METABOLIC PANEL
ALT: 33 U/L (ref 17–63)
AST: 34 U/L (ref 15–41)
Albumin: 4.1 g/dL (ref 3.5–5.0)
Alkaline Phosphatase: 92 U/L (ref 38–126)
Anion gap: 16 — ABNORMAL HIGH (ref 5–15)
BUN: 21 mg/dL — ABNORMAL HIGH (ref 6–20)
CO2: 20 mmol/L — ABNORMAL LOW (ref 22–32)
Calcium: 9.3 mg/dL (ref 8.9–10.3)
Chloride: 95 mmol/L — ABNORMAL LOW (ref 101–111)
Creatinine, Ser: 1.17 mg/dL (ref 0.61–1.24)
GFR calc Af Amer: >60 mL/min (ref >60)
GFR calc non Af Amer: >60 mL/min (ref >60)
Glucose, Bld: 506 mg/dL (ref 65–99)
Potassium: 4.3 mmol/L (ref 3.5–5.1)
Sodium: 131 mmol/L — ABNORMAL LOW (ref 135–145)
Total Bilirubin: 2.1 mg/dL — ABNORMAL HIGH (ref 0.3–1.2)
Total Protein: 7.5 g/dL (ref 6.5–8.1)

## 2018-01-09 LAB — URINE DRUG SCREEN, QUALITATIVE (ARMC ONLY)
Amphetamines, Ur Screen: NOT DETECTED
Barbiturates, Ur Screen: NOT DETECTED
Benzodiazepine, Ur Scrn: NOT DETECTED
Cannabinoid 50 Ng, Ur ~~LOC~~: NOT DETECTED
Cocaine Metabolite,Ur ~~LOC~~: NOT DETECTED
MDMA (Ecstasy)Ur Screen: NOT DETECTED
Methadone Scn, Ur: NOT DETECTED
Opiate, Ur Screen: NOT DETECTED
Phencyclidine (PCP) Ur S: NOT DETECTED
Tricyclic, Ur Screen: NOT DETECTED

## 2018-01-09 LAB — GLUCOSE, CAPILLARY: Glucose-Capillary: 324 mg/dL — ABNORMAL HIGH (ref 65–99)

## 2018-01-09 LAB — MAGNESIUM: Magnesium: 1.5 mg/dL — ABNORMAL LOW (ref 1.7–2.4)

## 2018-01-09 LAB — LIPASE, BLOOD: Lipase: 37 U/L (ref 11–51)

## 2018-01-09 MED ORDER — POTASSIUM CHLORIDE ER 10 MEQ PO TBCR: 10.0000 meq | EXTENDED_RELEASE_TABLET | Freq: Every day | ORAL | 0 refills | Status: DC

## 2018-01-09 MED ORDER — METFORMIN HCL 500 MG PO TABS: 500.0000 mg | ORAL_TABLET | Freq: Two times a day (BID) | ORAL | 1 refills | Status: DC

## 2018-01-09 MED ORDER — MAGNESIUM SULFATE 2 GM/50ML IV SOLN
2.0000 g | Freq: Once | INTRAVENOUS | Status: AC
  Administered 2018-01-09: 2 g via INTRAVENOUS
  Filled 2018-01-09: qty 50

## 2018-01-09 MED ORDER — SODIUM CHLORIDE 0.9 % IV BOLUS
1000.0000 mL | Freq: Once | INTRAVENOUS | Status: AC
Start: 2018-01-09 — End: 2018-01-09
  Administered 2018-01-09: 1000 mL via INTRAVENOUS

## 2018-01-09 MED ORDER — INSULIN ASPART 100 UNIT/ML ~~LOC~~ SOLN
8.0000 [IU] | Freq: Once | SUBCUTANEOUS | Status: AC
  Administered 2018-01-09: 8 [IU] via INTRAVENOUS
  Filled 2018-01-09: qty 1

## 2018-01-09 MED ORDER — SPIRONOLACTONE 25 MG PO TABS: 25.0000 mg | ORAL_TABLET | Freq: Every day | ORAL | 0 refills | Status: DC

## 2018-01-09 MED ORDER — METOPROLOL SUCCINATE ER 50 MG PO TB24: 50.0000 mg | ORAL_TABLET | Freq: Two times a day (BID) | ORAL | 0 refills | Status: DC

## 2018-01-09 MED ORDER — ONDANSETRON HCL 4 MG PO TABS: 4.0000 mg | ORAL_TABLET | Freq: Three times a day (TID) | ORAL | 0 refills | Status: DC | PRN

## 2018-01-09 MED ORDER — SODIUM CHLORIDE 0.9 % IV BOLUS
1000.0000 mL | Freq: Once | INTRAVENOUS | Status: AC
  Administered 2018-01-09: 1000 mL via INTRAVENOUS

## 2018-01-09 MED ORDER — TORSEMIDE 20 MG PO TABS: 20.0000 mg | ORAL_TABLET | Freq: Every day | ORAL | 0 refills | Status: DC

## 2018-01-09 NOTE — Discharge Instructions (Signed)
It is essential that you take your medications as prescribed, if you have fever, chills, bleeding, abdominal pain, chest pain, lightheadedness, or you feel worse in any way please return to the emergency department.  Follow closely with primary care and if you cannot get into see her primary care, please follow with the physicians listed above at the clinic.  I am rewriting for your diabetes medication, please take it as prescribed.  If you do not follow-up with primary care and get your sugar under better control as an outpatient I am very worried about the long-term state of your health.

## 2018-01-09 NOTE — ED Provider Notes (Addendum)
[facility] Emergency Department Provider Note  ____________________________________________   I have reviewed the triage vital signs and the nursing notes. Where available I have reviewed prior notes and, if possible and indicated, outside hospital notes.    HISTORY  Chief Complaint Diarrhea    HPI Gabriel [name]. is a 47 y.o. male  a history of chronic CHF, most recent EF of 5045 to 50% performed here on September 2018, history of chronic noncardiac chest pain, history of uncontrolled diabetes he has not taken any of his diabetes medication in 3 months because he states he cannot afford them, does not have a primary care doctor because he does not have insurance, multiple different visits to the emergency room in the last few years with extensive imaging including 7 different CT scans of his chest since 2017, 5 different CT scans of his abdomen pelvis in similar timeframe, 4 different ultrasounds of his abdomen most recently in September 2018 which showed no acute abnormality, with presents today with diarrhea.  Patient has had diarrhea for the last 2 days.  He states every time he eats something is nonbloody diarrhea.  Denies melena bright red blood per rectum.  He has not vomited.  Positive sick contacts.  He has no abdominal pain sometimes he is a cramping sensation before he has diarrhea.  Food makes it worse nothing makes it better, no abdominal pain at this time, no fever no chills, no recent travel no recent antibiotics, has tried nothing this at home.  Is not wanting to eat very much because every time he eats, he feels that he might diarrhea but today he actually had diarrhea one time, which seems to be a great improvement over yesterday.  He has not checked his blood sugar   Past Medical History:  Diagnosis Date  . Chest wall pain, chronic   . Chronic systolic CHF (congestive heart failure) ([place])    a. 03/2015 Echo: EF 45-50%; b. 12/2015 Echo: EF 20-25%;  c. 02/2016 Echo: EF 30-35%; d. 11/2016 Echo: EF 40-45%.  . Chronic Troponin Elevation   . CKD (chronic kidney disease), stage III ([place])   . COPD (chronic obstructive pulmonary disease) ([place])   . Diabetes mellitus without complication ([place])   . Hypertension    Resolved since weight loss  . NICM (nonischemic cardiomyopathy) ([place])    a. 03/2015 Echo: EF 45-50%; b. 04/2015 MV: small defect of mild severity in apex 2/2 apical thinning, EF 30-44%;  c. 12/2015 Echo: EF 20-25%; d. 02/2016 Echo: EF 30-35%; e. 11/2016 Echo: EF 40-45%, diff HK, mildly dil Ao root, mild MR, mod dil LA;  f. 12/2016 Cath: LM nl, LAD/Diags/LCX/OMs min irregs, RCA 40p/m/d.  . Non-obstructive CAD (coronary artery disease)    a. 04/2015 low risk MV;  b. 12/2016 Cath: minor irregs in LAD/Diag/LCX/OM, RCA 40p/m/d.  [name] (nonsustained ventricular tachycardia) ([place])    a. 12/2015 noted on tele-->amio;  b. 12/2015 Event monitor: no VT. Atach noted.  . Obesity (BMI 30.0-34.9)   . Psoriasis   . Renal insufficiency   . Syncope    a. 01/2016 - felt to be vasovagal.  . Tobacco abuse     Patient Active Problem List   Diagnosis Date Noted  . Chronic systolic heart failure ([place]) 09/02/2017  . Chest pain 08/24/2017  . Dizziness   . Noncompliance with medications 04/29/2017  . Back pain 03/06/2017  . Tobacco use 12/04/2016  . Gallbladder sludge 12/04/2016  . Coronary artery disease,  non-occlusive 03/15/2016  . Atypical chest pain 02/16/2016  . Orthostatic hypotension 01/23/2016  . Hypokalemia 01/23/2016  . Hypomagnesemia 01/23/2016  . Congestive dilated cardiomyopathy ([place]) 01/17/2016  . NSVT (nonsustained ventricular tachycardia) ([place])   . Elevated troponin   . Sepsis ([place]) 01/03/2016  . Diabetes ([place]) 05/17/2015  . Hyperlipidemia 05/17/2015  . COPD (chronic obstructive pulmonary disease) ([place]) 05/17/2015  . HTN (hypertension) 03/31/2015    Past Surgical History:  Procedure Laterality Date  . AMPUTATION    . CARDIAC  CATHETERIZATION    . FINGER AMPUTATION     Traumatic  . LEFT HEART CATH AND CORONARY ANGIOGRAPHY N/A 01/06/2017   Procedure: Left Heart Cath and Coronary Angiography;  Surgeon: Wellington Hampshire, MD;  Location: [facility];  Service: Cardiovascular;  Laterality: N/A;    Prior to Admission medications   Medication Sig Start Date End Date Taking? Authorizing Provider  aspirin 81 MG chewable tablet Chew 1 tablet (81 mg total) by mouth daily. Reported on 02/08/2016 06/02/17  Yes [name] A, FNP  gabapentin (NEURONTIN) 300 MG capsule Take 1 capsule (300 mg total) by mouth 3 (three) times daily. 11/16/17 11/16/18 Yes Nance Pear, MD  metFORMIN (GLUCOPHAGE) 500 MG tablet Take 1 tablet by mouth 2 (two) times daily.   Yes [provider]  metoprolol succinate (TOPROL-XL) 50 MG 24 hr tablet Take 1 tablet (50 mg total) by mouth 2 (two) times daily. Take with or immediately following a meal. 01/05/18  Yes Gollan, Kathlene November, MD  potassium chloride (K-DUR) 10 MEQ tablet Take 1 tablet (10 mEq total) daily by mouth. 07/07/17  Yes Gollan, Kathlene November, MD  ranitidine (ZANTAC) 150 MG tablet Take 1 tablet (150 mg total) by mouth 2 (two) times daily. 06/04/17  Yes Hackney, Otila Kluver A, FNP  rosuvastatin (CRESTOR) 20 MG tablet Take 20 mg by mouth daily.   Yes [provider]  sacubitril-valsartan (ENTRESTO) 97-103 MG Take 1 tablet by mouth 2 (two) times daily. 01/05/18  Yes Minna Merritts, MD  spironolactone (ALDACTONE) 25 MG tablet Take 1 tablet (25 mg total) by mouth daily. 06/02/17  Yes Hackney, Otila Kluver A, FNP  torsemide (DEMADEX) 20 MG tablet Take 1 tablet (20 mg total) by mouth daily. 01/05/18  Yes Gollan, Kathlene November, MD  varenicline (CHANTIX CONTINUING MONTH PAK) 1 MG tablet Take 1 tablet (1 mg total) by mouth 2 (two) times daily. Take 1/2 tablet daily for 3 days, then 1/2 tablet twice daily for 4 days Patient not taking: Reported on 01/09/2018 12/01/17   Alisa Graff, FNP     Allergies Prednisone  Family History  Problem Relation Age of Onset  . Diabetes Mellitus II Mother   . Hypertension Father   . Cancer Paternal Aunt   . Cancer Maternal Grandfather     Social History Social History   Tobacco Use  . Smoking status: Former Smoker    Packs/day: 1.00    Years: 33.00    Pack years: 33.00    Types: Cigarettes  . Smokeless tobacco: Never Used  . Tobacco comment: Quit a little over a month ago.  Substance Use Topics  . Alcohol use: No    Alcohol/week: 0.0 oz    Frequency: Never    Comment: occassionally  . Drug use: No    Review of Systems Constitutional: No fever/chills Eyes: No visual changes. ENT: No sore throat. No stiff neck no neck pain Cardiovascular: Denies chest pain. Respiratory: Denies shortness of breath. Gastrointestinal:   no  vomiting.  No diarrhea.  No constipation. Genitourinary: Negative for dysuria. Musculoskeletal: Negative lower extremity swelling Skin: Negative for rash. Neurological: Negative for severe headaches, focal weakness or numbness.   ____________________________________________   PHYSICAL EXAM:  VITAL SIGNS: ED Triage Vitals  Enc Vitals Group     BP 01/09/18 1341 128/76     Pulse Rate 01/09/18 1341 100     Resp 01/09/18 1341 18     Temp 01/09/18 1341 98.2 F (36.8 C)     Temp Source 01/09/18 1341 Oral     SpO2 01/09/18 1341 96 %     Weight 01/09/18 1342 257 lb (116.6 kg)     Height 01/09/18 1342 6' (1.829 m)     Head Circumference --      Peak Flow --      Pain Score 01/09/18 1341 7     Pain Loc --      Pain Edu? --      Excl. in [place]? --     Constitutional: Alert and oriented. Well appearing and in no acute distress. Eyes: Conjunctivae are normal Head: Atraumatic HEENT: No congestion/rhinnorhea. Mucous membranes are dry.  Oropharynx non-erythematous Neck:   Nontender with no meningismus, no masses, no stridor Cardiovascular: Normal rate, regular rhythm. Grossly normal heart sounds.   Good peripheral circulation. Respiratory: Normal respiratory effort.  No retractions. Lungs CTAB. Abdominal: Soft and nontender. No distention. No guarding no rebound Back:  There is no focal tenderness or step off.  there is no midline tenderness there are no lesions noted. there is no CVA tenderness Musculoskeletal: No lower extremity tenderness, no upper extremity tenderness. No joint effusions, no DVT signs strong distal pulses no edema Neurologic:  Normal speech and language. No gross focal neurologic deficits are appreciated.  Skin:  Skin is warm, dry and intact. No rash noted. Psychiatric: Mood and affect are normal. Speech and behavior are normal.  ____________________________________________   LABS (all labs ordered are listed, but only abnormal results are displayed)  Labs Reviewed  COMPREHENSIVE METABOLIC PANEL - Abnormal; Notable for the following components:      Result Value   Sodium 131 (*)    Chloride 95 (*)    CO2 20 (*)    Glucose, Bld 506 (*)    BUN 21 (*)    Total Bilirubin 2.1 (*)    Anion gap 16 (*)    All other components within normal limits  CBC - Abnormal; Notable for the following components:   WBC 10.8 (*)    RBC 6.17 (*)    Hemoglobin 18.4 (*)    All other components within normal limits  LIPASE, BLOOD  URINALYSIS, COMPLETE (UACMP) WITH MICROSCOPIC    Pertinent labs  results that were available during my care of the patient were reviewed by me and considered in my medical decision making (see chart for details). ____________________________________________  EKG  I personally interpreted any EKGs ordered by me or triage  ____________________________________________  RADIOLOGY  Pertinent labs & imaging results that were available during my care of the patient were reviewed by me and considered in my medical decision making (see chart for details). If possible, patient and/or family made aware of any abnormal findings.  No results  found. ____________________________________________    PROCEDURES  Procedure(s) performed: None  Procedures  Critical Care performed: None  ____________________________________________   INITIAL IMPRESSION / ASSESSMENT AND PLAN / ED COURSE  Pertinent labs & imaging results that were available during my care of the patient  were reviewed by me and considered in my medical decision making (see chart for details).  Patient here with diarrhea which is improving on its own, only one episode of diarrhea today however he does appear dehydrated, clinically his sugar is over 500 he has a very slight anion gap but I doubt this represents DKA, he normally has a sugar between 270 and 500 when he is here.  This is not atypical for him.  His hemoglobin is 18 however which is likely consistent with contraction, white count is not elevated by these indices, platelet count is reassuring, his sodium is slightly low his chloride is slightly low, all consistent with diarrheal pathology.  BUN is elevated but creatinine is reassuring consistent with dehydration.  We will give him IV fluid.  Patient does have a history of mild CHF with an EF of 45 to 50% last time checked, we will therefore give him IV fluid and constantly watching.  I will give him insulin to bring down his sugar which again is not markedly elevated for him last time we saw him I think it was in the 400s.  We will continue to monitor him.  I have strongly recommended close outpatient follow-up with primary care, or [facility] for further treatment.  Patient does seem to get most of his care for the emergency room and has had multiple different significant graphic studies which do not seem to produce many results.  He will be better served I think in general if he had good primary care.     ----------------------------------------- 6:20 PM on 01/09/2018 -----------------------------------------  Remains asymptomatic here no vomiting  tolerating p.o., blood sugar is down in the 300s which for him is actually pretty good compared to prior visits, trace ketones in urine likely cleared with fluid and insulin, no evidence of significant DKA very slight anion gap mostly because of dehydration I suspect.  I did offer to recheck his blood work to ensure that his gap and completely closed and that we are doing better in all respects but he refuses, he states he really feels much better wants to go home.  Abdomen remains benign on serial exams.  We have strongly advise close outpatient follow-up for multiple different providers the [facility], etc.  I am rewriting for his home medications also send him home with nausea medication.  Patient very comfort with this plan and eager to go.  He is unable to give [place] a stool sample here, low suspicion for C. difficile given low risk factors however, without a stool sample we cannot check it.  We have strongly advised to follow-up with PCP for recheck.  After fluids, his sats are 9596% when I am in the room.  He has no shortness of breath or evidence of fluid overload.  Bili was slightly elevated, has been before has had multiple ultrasounds of his gallbladder which were reassuring, we will have him follow closely with his PCP for recheck. ____________________________________________   FINAL CLINICAL IMPRESSION(S) / ED DIAGNOSES  Final diagnoses:  None      This chart was dictated using voice recognition software.  Despite best efforts to proofread,  errors can occur which can change meaning.      Schuyler Amor, MD 01/09/18 1554    Schuyler Amor, MD 01/09/18 [phone]

## 2018-01-09 NOTE — ED Notes (Signed)
Date and time results received: 01/09/18 1518 (use smartphrase ".now" to insert current time)  Test: Blood Glucose Critical Value: Blood Glucose: 506  Name of Provider Notified: Dr. Burlene Arnt

## 2018-01-09 NOTE — ED Triage Notes (Signed)
Pt presents to ED via POV c/o diarrhea starting yesterday and cramping abd pain. Pt denies vomiting but states "anytime I eat anything it sends me running to the bathroom with diarrhea."

## 2018-01-09 NOTE — ED Notes (Signed)
Dr Burlene Arnt offered repeat bloodwork. Pt refused and ask for discharge home. MD aware.

## 2018-01-09 NOTE — ED Notes (Signed)
Informed RN that patient has been roomed and is ready for evaluation.  Patient in NAD at this time and call bell placed within reach.   

## 2018-01-20 ENCOUNTER — Ambulatory Visit: Payer: Self-pay | Admitting: Adult Health Nurse Practitioner

## 2018-01-20 ENCOUNTER — Ambulatory Visit: Payer: Self-pay

## 2018-01-20 ENCOUNTER — Encounter: Payer: Self-pay | Admitting: Adult Health Nurse Practitioner

## 2018-01-20 VITALS — BP 151/104 | HR 92 | Temp 98.5°F | Wt 253.6 lb

## 2018-01-20 DIAGNOSIS — E119 Type 2 diabetes mellitus without complications: Secondary | ICD-10-CM

## 2018-01-20 MED ORDER — INSULIN DETEMIR 100 UNIT/ML ~~LOC~~ SOLN: 10.0000 [IU] | Freq: Every day | SUBCUTANEOUS | 11 refills | Status: DC

## 2018-01-20 NOTE — Progress Notes (Signed)
Patient: Gabriel [name]. Male    DOB: [birth year]   47 y.o.   MRN: [id number] Visit Date: 01/20/2018  Today's Provider: Staci Acosta, NP   Chief Complaint  Patient presents with  . Diabetes    Newely diagnosed diabetic   Subjective:    HPI  Pt states he is taking Metformin 500mg  BID for his DM since last Oct.  Recently discharged from the hospital for weakness and DM- he ws given insulin in the hospital.  CBG was 342 this am. After eating his CBG was 400.  Last A1c was 5 months ago-9.2.     Allergies  Allergen Reactions  . Prednisone Other (See Comments)    Reaction: Hallucinations     Previous Medications   ASPIRIN 81 MG CHEWABLE TABLET    Chew 1 tablet (81 mg total) by mouth daily. Reported on 02/08/2016   GABAPENTIN (NEURONTIN) 300 MG CAPSULE    Take 1 capsule (300 mg total) by mouth 3 (three) times daily.   METFORMIN (GLUCOPHAGE) 500 MG TABLET    Take 1 tablet (500 mg total) by mouth 2 (two) times daily.   METOPROLOL SUCCINATE (TOPROL-XL) 50 MG 24 HR TABLET    Take 1 tablet (50 mg total) by mouth 2 (two) times daily. Take with or immediately following a meal.   ONDANSETRON (ZOFRAN) 4 MG TABLET    Take 1 tablet (4 mg total) by mouth every 8 (eight) hours as needed for nausea or vomiting.   POTASSIUM CHLORIDE (K-DUR) 10 MEQ TABLET    Take 1 tablet (10 mEq total) by mouth daily.   RANITIDINE (ZANTAC) 150 MG TABLET    Take 1 tablet (150 mg total) by mouth 2 (two) times daily.   ROSUVASTATIN (CRESTOR) 20 MG TABLET    Take 20 mg by mouth daily.   SACUBITRIL-VALSARTAN (ENTRESTO) 97-103 MG    Take 1 tablet by mouth 2 (two) times daily.   SPIRONOLACTONE (ALDACTONE) 25 MG TABLET    Take 1 tablet (25 mg total) by mouth daily.   TORSEMIDE (DEMADEX) 20 MG TABLET    Take 1 tablet (20 mg total) by mouth daily.   VARENICLINE (CHANTIX CONTINUING MONTH PAK) 1 MG TABLET    Take 1 tablet (1 mg total) by mouth 2 (two) times daily. Take 1/2 tablet daily for 3 days, then 1/2 tablet twice  daily for 4 days    Review of Systems  All other systems reviewed and are negative.   Social History   Tobacco Use  . Smoking status: Former Smoker    Packs/day: 1.00    Years: 33.00    Pack years: 33.00    Types: Cigarettes  . Smokeless tobacco: Never Used  . Tobacco comment: Quit a little over a month ago.  Substance Use Topics  . Alcohol use: No    Alcohol/week: 0.0 oz    Frequency: Never    Comment: occassionally   Objective:   BP (!) 151/104 (BP Location: Left Arm, Patient Position: Sitting, Cuff Size: Large)   Pulse 92   Temp 98.5 F (36.9 C) (Oral)   Wt 253 lb 9.6 oz (115 kg)   BMI 34.39 kg/m   Physical Exam  Constitutional: He is oriented to person, place, and time. He appears well-developed and well-nourished.  HENT:  Head: Normocephalic and atraumatic.  Cardiovascular: Normal rate, regular rhythm and normal heart sounds.  Pulmonary/Chest: Effort normal and breath sounds normal.  Neurological: He is alert and oriented to person, place, and time.  Skin: Skin is warm and dry.        Assessment & Plan:     Add Levemir 10 units QHS.  Check CBGs daily fasting- bring log to next OV.  Continue Metformin.  Healthy lifestyle changes.  If CBG are continuously staying >300 notify provider for adjustment in insulin.   FU in 1 month.        Staci Acosta, NP   [facility]

## 2018-01-21 LAB — BASIC METABOLIC PANEL
BUN/Creatinine Ratio: 13 (ref 9–20)
BUN: 19 mg/dL (ref 6–24)
CO2: 17 mmol/L — ABNORMAL LOW (ref 20–29)
Calcium: 10.5 mg/dL — ABNORMAL HIGH (ref 8.7–10.2)
Chloride: 94 mmol/L — ABNORMAL LOW (ref 96–106)
Creatinine, Ser: 1.45 mg/dL — ABNORMAL HIGH (ref 0.76–1.27)
GFR calc Af Amer: 65 mL/min/{1.73_m2} (ref >59)
GFR calc non Af Amer: 57 mL/min/{1.73_m2} — ABNORMAL LOW (ref >59)
Glucose: 345 mg/dL — ABNORMAL HIGH (ref 65–99)
Potassium: 4.3 mmol/L (ref 3.5–5.2)
Sodium: 136 mmol/L (ref 134–144)

## 2018-01-21 LAB — HEMOGLOBIN A1C
Est. average glucose Bld gHb Est-mCnc: 258 mg/dL
Hgb A1c MFr Bld: 10.6 % — ABNORMAL HIGH (ref 4.8–5.6)

## 2018-01-23 ENCOUNTER — Telehealth: Payer: Self-pay | Admitting: Pharmacist

## 2018-01-23 NOTE — Telephone Encounter (Signed)
01/23/2018 10:40:47 AM - Levemir Vials  01/23/18 Received pharmacy printout for Levemir Vials Inject 10 units under the skin daily at bedtime, printed Novo Nordisk application-sending provider portion to [facility], mailing patient his portion to sign & return, also asking for a current Medicaid denial, the one in chart is dated 03/03/2017-Novo Nordisk will require this before processing.Gabriel [name]

## 2018-01-27 ENCOUNTER — Ambulatory Visit: Payer: Self-pay | Attending: Family | Admitting: Family

## 2018-01-27 ENCOUNTER — Encounter: Payer: Self-pay | Admitting: Family

## 2018-01-27 VITALS — BP 136/102 | HR 95 | Resp 18 | Ht 72.0 in | Wt 249.1 lb

## 2018-01-27 DIAGNOSIS — E1122 Type 2 diabetes mellitus with diabetic chronic kidney disease: Secondary | ICD-10-CM | POA: Insufficient documentation

## 2018-01-27 DIAGNOSIS — Z79899 Other long term (current) drug therapy: Secondary | ICD-10-CM | POA: Insufficient documentation

## 2018-01-27 DIAGNOSIS — Z7984 Long term (current) use of oral hypoglycemic drugs: Secondary | ICD-10-CM | POA: Insufficient documentation

## 2018-01-27 DIAGNOSIS — Z720 Tobacco use: Secondary | ICD-10-CM

## 2018-01-27 DIAGNOSIS — I10 Essential (primary) hypertension: Secondary | ICD-10-CM

## 2018-01-27 DIAGNOSIS — N183 Chronic kidney disease, stage 3 (moderate): Secondary | ICD-10-CM | POA: Insufficient documentation

## 2018-01-27 DIAGNOSIS — I509 Heart failure, unspecified: Secondary | ICD-10-CM | POA: Insufficient documentation

## 2018-01-27 DIAGNOSIS — Z9889 Other specified postprocedural states: Secondary | ICD-10-CM | POA: Insufficient documentation

## 2018-01-27 DIAGNOSIS — I5022 Chronic systolic (congestive) heart failure: Secondary | ICD-10-CM

## 2018-01-27 DIAGNOSIS — Z87891 Personal history of nicotine dependence: Secondary | ICD-10-CM | POA: Insufficient documentation

## 2018-01-27 DIAGNOSIS — M5441 Lumbago with sciatica, right side: Secondary | ICD-10-CM

## 2018-01-27 DIAGNOSIS — M549 Dorsalgia, unspecified: Secondary | ICD-10-CM | POA: Insufficient documentation

## 2018-01-27 DIAGNOSIS — I130 Hypertensive heart and chronic kidney disease with heart failure and stage 1 through stage 4 chronic kidney disease, or unspecified chronic kidney disease: Secondary | ICD-10-CM | POA: Insufficient documentation

## 2018-01-27 DIAGNOSIS — M5442 Lumbago with sciatica, left side: Secondary | ICD-10-CM

## 2018-01-27 DIAGNOSIS — E119 Type 2 diabetes mellitus without complications: Secondary | ICD-10-CM

## 2018-01-27 DIAGNOSIS — G8929 Other chronic pain: Secondary | ICD-10-CM

## 2018-01-27 DIAGNOSIS — J449 Chronic obstructive pulmonary disease, unspecified: Secondary | ICD-10-CM | POA: Insufficient documentation

## 2018-01-27 NOTE — Patient Instructions (Addendum)
Continue weighing daily and call for an overnight weight gain of > 2 pounds or a weekly weight gain of >5 pounds.  Increase metoprolol succinate to 2 tablets (100mg ) in the morning and 1 tablet (50mg ) in the evening.

## 2018-01-27 NOTE — Progress Notes (Signed)
Patient ID: Gabriel Eugene., male    DOB: [birth year], 47 y.o.   MRN: [id number]  HPI  Gabriel [name] is a 47 y/o male with a history of HTN, diabetes, COPD, CKD, asthma, current tobacco use and chronic heart failure.   Most recent echo done 04/29/17 showed EF 45-50%. Echo report that was done 11/28/16 and it showed an EF of 40-45% along with mild Gabriel. EF has improved from 30-35% July 2017.  Was in ED 01/09/18 due to diarrhea. Patient was given IV fluids for dehydration. Was in the ED 11/16/17 due to chronic chest/back/leg pain. Medications adjusted and he was released. Admitted 09/30/17 due to chest pain. Cardiology consult was obtained. Diuretic was decreased and he was discharged the following day. Admitted 08/21/17 due to chest pain. Cardiology consult obtained. Started on zpack due to bronchitis symptoms. Elevated troponin thought to be due to demand ischemia. Discharged after 3 days. Was in the ED 07/16/17 due to back pain. Treated and released. Was in the ED 06/30/17 due to HTN. Medication adjusted and he was released.   He presents today for a follow-up visit with a chief complaint of moderate shortness of breath upon moderate exertion. He describes this as chronic in nature having been present for several years. He does not appear to be fluid overloaded at this time. He has associated fatigue, cough, abdominal distention, chronic back pain, numbness down both thighs, depression and difficulty sleeping. He denies any edema, chest pain, dizziness or weight gain.   Patient was recently in ED with diarrhea. He reports that this comes and goes but that it has not been as bad lately. He also endorses a chronic headache that he has had for the past 3 weeks. He refuses to take any tylenol and due to renal status should not be on ibuprofen. I recommended that he try tylenol to see if he would get any relief.   Past Medical History:  Diagnosis Date  . Chest wall pain, chronic   . Chronic systolic CHF  (congestive heart failure) ([place])    a. 03/2015 Echo: EF 45-50%; b. 12/2015 Echo: EF 20-25%; c. 02/2016 Echo: EF 30-35%; d. 11/2016 Echo: EF 40-45%.  . Chronic Troponin Elevation   . CKD (chronic kidney disease), stage III ([place])   . COPD (chronic obstructive pulmonary disease) ([place])   . Diabetes mellitus without complication ([place])   . Hypertension    Resolved since weight loss  . Myocardial infarction ([place])   . NICM (nonischemic cardiomyopathy) ([place])    a. 03/2015 Echo: EF 45-50%; b. 04/2015 MV: small defect of mild severity in apex 2/2 apical thinning, EF 30-44%;  c. 12/2015 Echo: EF 20-25%; d. 02/2016 Echo: EF 30-35%; e. 11/2016 Echo: EF 40-45%, diff HK, mildly dil Ao root, mild Gabriel, mod dil LA;  f. 12/2016 Cath: LM nl, LAD/Diags/LCX/OMs min irregs, RCA 40p/m/d.  . Non-obstructive CAD (coronary artery disease)    a. 04/2015 low risk MV;  b. 12/2016 Cath: minor irregs in LAD/Diag/LCX/OM, RCA 40p/m/d.  [name] (nonsustained ventricular tachycardia) ([place])    a. 12/2015 noted on tele-->amio;  b. 12/2015 Event monitor: no VT. Atach noted.  . Obesity (BMI 30.0-34.9)   . Psoriasis   . Renal insufficiency   . Syncope    a. 01/2016 - felt to be vasovagal.  . Tobacco abuse    Past Surgical History:  Procedure Laterality Date  . AMPUTATION    . CARDIAC CATHETERIZATION    . FINGER AMPUTATION  Traumatic  . FINGER FRACTURE SURGERY Left   . LEFT HEART CATH AND CORONARY ANGIOGRAPHY N/A 01/06/2017   Procedure: Left Heart Cath and Coronary Angiography;  Surgeon: Wellington Hampshire, MD;  Location: [facility];  Service: Cardiovascular;  Laterality: N/A;   Family History  Problem Relation Age of Onset  . Diabetes Mellitus II Mother   . Hypertension Father   . Cancer Paternal Aunt   . Cancer Maternal Grandfather   . Diabetes Maternal Grandfather    Social History   Tobacco Use  . Smoking status: Former Smoker    Packs/day: 1.00    Years: 33.00    Pack years: 33.00    Types: Cigarettes  .  Smokeless tobacco: Never Used  . Tobacco comment: Quit a little over a month ago.  Substance Use Topics  . Alcohol use: No    Alcohol/week: 0.0 oz    Frequency: Never    Comment: occassionally   Allergies  Allergen Reactions  . Prednisone Other (See Comments)    Reaction: Hallucinations     Prior to Admission medications   Medication Sig Start Date End Date Taking? Authorizing Provider  amLODipine (NORVASC) 5 MG tablet Take 1 tablet (5 mg total) daily by mouth. 06/30/17 06/30/18 Yes Eula Listen, MD  aspirin 81 MG chewable tablet Chew 1 tablet (81 mg total) by mouth daily. Reported on 02/08/2016 06/02/17  Yes [name] A, FNP  gabapentin (NEURONTIN) 300 MG capsule Take 1 capsule (300 mg total) by mouth 3 (three) times daily. 11/16/17 11/16/18 Yes Nance Pear, MD  metFORMIN (GLUCOPHAGE) 500 MG tablet Take 1 tablet by mouth 2 (two) times daily.   Yes [provider]  metoprolol succinate (TOPROL-XL) 50 MG 24 hr tablet Take 50 mg by mouth daily. Take with or immediately following a meal.   Yes [provider]  potassium chloride (K-DUR) 10 MEQ tablet Take 1 tablet (10 mEq total) daily by mouth. 07/07/17  Yes Gollan, Kathlene November, MD  ranitidine (ZANTAC) 150 MG tablet Take 1 tablet (150 mg total) by mouth 2 (two) times daily. 06/04/17  Yes Hackney, Otila Kluver A, FNP  rosuvastatin (CRESTOR) 20 MG tablet Take 20 mg by mouth daily.   Yes [provider]  sacubitril-valsartan (ENTRESTO) 49-51 MG Take 1 tablet 2 (two) times daily by mouth. 06/27/17  Yes Minna Merritts, MD  spironolactone (ALDACTONE) 25 MG tablet Take 1 tablet (25 mg total) by mouth daily. 06/02/17  Yes Hackney, Otila Kluver A, FNP  torsemide (DEMADEX) 10 MG tablet Take 2 tablets (20 mg total) by mouth daily. 10/01/17  Yes Vaughan Basta, MD  albuterol (PROVENTIL HFA;VENTOLIN HFA) 108 (90 Base) MCG/ACT inhaler Inhale 2 puffs into the lungs every 6 (six) hours as needed for wheezing or shortness of  breath. Patient not taking: Reported on 12/01/2017 11/16/17   Nance Pear, MD  polyethylene glycol [facility] / Floria Raveling) packet Take 17 g by mouth daily. Patient not taking: Reported on 12/01/2017 10/01/17   Vaughan Basta, MD    Review of Systems  Constitutional: Positive for fatigue. Negative for appetite change.  HENT: Negative for congestion, postnasal drip and sore throat.   Eyes: Negative.   Respiratory: Positive for cough (chest sore from coughing), chest tightness and shortness of breath (little better).   Cardiovascular: Positive for chest pain ("just the usual") and palpitations.  Gastrointestinal: Positive for abdominal distention and diarrhea. Negative for abdominal pain.  Endocrine: Negative.   Genitourinary: Negative.   Musculoskeletal: Positive for back pain.  Skin: Negative.   Allergic/Immunologic: Negative.   Neurological: Positive for light-headedness, numbness (down both legs at times) and headaches (constant pressure on top of the head). Negative for dizziness.  Hematological: Negative for adenopathy. Does not bruise/bleed easily.  Psychiatric/Behavioral: Positive for sleep disturbance (due to pain down his legs). Negative for dysphoric mood and suicidal ideas. The patient is nervous/anxious (stress).    Vitals:   01/27/18 0843  BP: (!) 136/102  Pulse: 95  Resp: 18  SpO2: 98%  Weight: 249 lb 2 oz (113 kg)  Height: 6' (1.829 m)   Wt Readings from Last 3 Encounters:  01/27/18 249 lb 2 oz (113 kg)  01/20/18 253 lb 9.6 oz (115 kg)  01/20/18 253 lb 6.4 oz (114.9 kg)   Lab Results  Component Value Date   CREATININE 1.45 (H) 01/20/2018   CREATININE 1.17 01/09/2018   CREATININE 0.98 11/16/2017   Physical Exam  Constitutional: He is oriented to person, place, and time. He appears well-developed and well-nourished.  HENT:  Head: Normocephalic and atraumatic.  Neck: Normal range of motion. Neck supple. No JVD present.  Cardiovascular: Normal rate and  regular rhythm.  Pulmonary/Chest: Effort normal. He has no wheezes. He has no rales. He exhibits no tenderness.  Abdominal: Soft. He exhibits distension. There is no tenderness.  Musculoskeletal: He exhibits tenderness. He exhibits no edema.       Lumbar back: He exhibits decreased range of motion, tenderness and pain. He exhibits no swelling.  Neurological: He is alert and oriented to person, place, and time.  Skin: Skin is warm and dry.  Psychiatric: He has a normal mood and affect. His behavior is normal. Thought content normal.  Nursing note and vitals reviewed.   Assessment & Plan:  1: Chronic heart failure with reduced ejection fraction- - NYHA class II - minimally fluid overloaded with abdominal distention - weigh daily and call for an overnight weight gain of >2 pounds or a weekly weight gain of >5 pounds.  - weight down 4 pounds from last reading but this may be in part due to lack of appetite and diarrhea - not adding salt and he was encouraged to closely follow a 2000mg  sodium diet - has been drinking between 10-20 glasses of water daily. He states that he was told that he was dehydrated in ED and that he had been told to drink a lot of water. I cautioned him that he needs to restrict his fluid intake while trying to manage any dehydration. He stated that he listens to his body and drinks water based off of weather he is thirsty. I explained that since his diarrhea has somewhat resolved that he is likely not dehydrated but that his thirstiness is more due to his blood sugar being so high. Patient stated that he would try to cut back on fluid but that he would still drink if he felt dehydrated - saw cardiologist Rockey Situ) 01/05/18 - BNP from 04/28/17 was 25.0 - BMP from 01/20/18 showed Na 136, K 4.3, GFR 57, Scr 1.45 - HR elevated at 95 at appointment. BP also elevated at 136/102. Will increase metoprolol succinate to 100mg  in AM and 50mg  in PM - PharmD went in and reviewed medications  with patient  2: HTN- - BP elevated slightly but he's also in chronic pain - checking his BP at home and he says that it's fluctuating - follows with PCP at [facility] from 11/16/17 reviewed and showed sodium 134, potassium 3.8 and  GFR >60  3: Back pain- - this started months ago and has been chronic ever since. 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  - reports that his chronic back pain is affecting him and causing a depression. Feels like no one wants to help him. Does say the gabapentin provides some relief - denies any suicidal thoughts/plans - encouraged him to go back to patient financial services and follow-up regarding his charity care status. If he gets approved, we can then make him a referral to pain management  4: Diabetes-   - checking his glucose at home and brought log with him to appointment. Blood sugar has been running in 300's -  A1c 10.6 % on 01/20/18 - fasting glucose at home this morning was 311 - followed by [facility] - completed some classes at the [facility] - was recently started on levemir by [facility]; instructed him to call them to see how they want to adjust his levemir dose as his blood sugar is still running in the 300's - next [facility] appointment is in July  5: Tobacco use- - previously smoked 1ppd of cigarettes - started on varenicline at last appointment on 12/01/17; had to stop taking it after ~ 1 week due to worsening anxiety and has since stopped smoking - has been tobacco free since 12/08/17  Patient did not bring his medications nor a list. Each medication was verbally reviewed with the patient and he was encouraged to bring the bottles to every visit to confirm accuracy of list.  Return on 04/29/18 or sooner for any questions/problems before then.    Lendon Ka, PharmD Pharmacy  Resident

## 2018-02-03 ENCOUNTER — Telehealth: Payer: Self-pay

## 2018-02-03 NOTE — Telephone Encounter (Signed)
Instructed pt to increase Levemir to 15 units. He will call back if still seeing high blood sugar readings.

## 2018-02-03 NOTE — Telephone Encounter (Signed)
-----   Message from Staci Acosta, NP sent at 01/29/2018  5:43 PM EDT ----- Have patient increase his Levemir to 15 units daily.   Thanks  ----- Message ----- From: Kreg Shropshire, CMA Sent: 01/29/2018   1:44 PM To: Teah Doles-Johnson, NP  Pt called to report blood sugar is staying in the 300's and is not coming down.

## 2018-02-25 ENCOUNTER — Ambulatory Visit: Payer: Self-pay | Admitting: Internal Medicine

## 2018-02-25 ENCOUNTER — Encounter: Payer: Self-pay | Admitting: Internal Medicine

## 2018-02-25 VITALS — BP 156/98 | HR 70 | Temp 97.9°F | Ht 71.25 in | Wt 248.5 lb

## 2018-02-25 DIAGNOSIS — E119 Type 2 diabetes mellitus without complications: Secondary | ICD-10-CM

## 2018-02-25 DIAGNOSIS — I951 Orthostatic hypotension: Secondary | ICD-10-CM

## 2018-02-25 MED ORDER — INSULIN DETEMIR 100 UNIT/ML ~~LOC~~ SOLN: 18.0000 [IU] | Freq: Every day | SUBCUTANEOUS | 1 refills | Status: DC

## 2018-02-25 NOTE — Progress Notes (Signed)
Subjective:    Patient ID: Gabriel [name]., male    DOB: 05-06-1971, 47 y.o.   MRN: [id number]  HPI   Patient has been seen at [facility] once before (01/20/18 by Gabriel Acosta, NP) to manage his diabetes; he was started on 10 units of insulin (Levemir) but increased to 15 units after instructed by [facility] by phone.   Patient reports checking his FSBS once daily when he wakes up. His daily log was reviewed and he is maintaining a blood glucose in the 250-range most days with Levemir (15 units) use.   He also reports feeling dizzy with dizziness spells lasting 15-20 seconds long; has happened when he stands. He also reports problems with his vision.   Patient reports that he has been tested for sleep apnea previously but he does not remember the exact name of the provider or practice. He reports that he was diagnosed with sleep apnea.    Review of Systems  Patient Active Problem List   Diagnosis Date Noted  . Chronic systolic heart failure ([place]) 09/02/2017  . Chest pain 08/24/2017  . Dizziness   . Noncompliance with medications 04/29/2017  . Back pain 03/06/2017  . Tobacco use 12/04/2016  . Gallbladder sludge 12/04/2016  . Coronary artery disease, non-occlusive 03/15/2016  . Atypical chest pain 02/16/2016  . Orthostatic hypotension 01/23/2016  . Hypokalemia 01/23/2016  . Hypomagnesemia 01/23/2016  . Congestive dilated cardiomyopathy ([place]) 01/17/2016  . NSVT (nonsustained ventricular tachycardia) ([place])   . Elevated troponin   . Diabetes ([place]) 05/17/2015  . Hyperlipidemia 05/17/2015  . COPD (chronic obstructive pulmonary disease) ([place]) 05/17/2015  . HTN (hypertension) 03/31/2015   Allergies as of 02/25/2018      Reactions   Prednisone Other (See Comments)   Reaction: Hallucinations       Medication List        Accurate as of 02/25/18  9:56 AM. Always use your most recent med list.          aspirin 81 MG chewable tablet Chew 1 tablet (81 mg total) by mouth daily.  Reported on 02/08/2016   gabapentin 300 MG capsule Commonly known as:  NEURONTIN Take 1 capsule (300 mg total) by mouth 3 (three) times daily.   insulin detemir 100 UNIT/ML injection Commonly known as:  LEVEMIR Inject 0.1 mLs (10 Units total) into the skin at bedtime.   metFORMIN 500 MG tablet Commonly known as:  GLUCOPHAGE Take 1 tablet (500 mg total) by mouth 2 (two) times daily.   metoprolol succinate 50 MG 24 hr tablet Commonly known as:  TOPROL-XL Take 1 tablet (50 mg total) by mouth 2 (two) times daily. Take with or immediately following a meal.   ondansetron 4 MG tablet Commonly known as:  ZOFRAN Take 1 tablet (4 mg total) by mouth every 8 (eight) hours as needed for nausea or vomiting.   potassium chloride 10 MEQ tablet Commonly known as:  K-DUR Take 1 tablet (10 mEq total) by mouth daily.   ranitidine 150 MG tablet Commonly known as:  ZANTAC Take 1 tablet (150 mg total) by mouth 2 (two) times daily.   rosuvastatin 20 MG tablet Commonly known as:  CRESTOR Take 20 mg by mouth daily.   sacubitril-valsartan 97-103 MG Commonly known as:  ENTRESTO Take 1 tablet by mouth 2 (two) times daily.   spironolactone 25 MG tablet Commonly known as:  ALDACTONE Take 1 tablet (25 mg total) by mouth daily.   torsemide 20 MG tablet Commonly known  as:  DEMADEX Take 1 tablet (20 mg total) by mouth daily.           Objective:   Physical Exam  Constitutional: He is oriented to person, place, and time.  Cardiovascular: Normal rate, regular rhythm and normal heart sounds.  Pulmonary/Chest: Effort normal and breath sounds normal.  Neurological: He is alert and oriented to person, place, and time.    BP (!) 156/98 (BP Location: Left Arm, Patient Position: Sitting)   Pulse 70   Temp 97.9 F (36.6 C) (Oral)   Ht 5' 11.25" (1.81 m)   Wt 248 lb 8 oz (112.7 kg)   BMI 34.42 kg/m     Assessment & Plan:   Clinically pt has evidence of sleep apnea.  Several studies in past  have reportedly been done with last one done locally a year ago by Dr. Annamary Carolin (?). Previous study was done under supervision of Dr. Humphrey Rolls a number of years ago, the reports are not available at this time for review.The pt will attempt to find more information to help locate records for [place] to review during next visit.   Diabetes Plan is to increase Levemir insulin to18 units nightly. Review of home records show that FSBS have dropped from 350's to 250's with adjustment of Levemir insulin from 10 units to 15 units. Hopefully, 3 unit adjustment will improve FSBS to under 200.    His vertigo/dizziness is suggestive of orthostatic hypotension; Pt reports BP readings at home are in the therapeutic ranges of 140/80. Pt will bring in home BP cuff in for calibration during his next visit.  Follow Up in 6 weeks with labs (MetC, CBC, A1C) a week prior to visit.

## 2018-02-27 ENCOUNTER — Telehealth: Payer: Self-pay | Admitting: Pharmacist

## 2018-02-27 NOTE — Telephone Encounter (Signed)
02/27/2018 10:17:05 AM - [name] & tips Dose Increase  02/27/18 I have received a pharmacy printout for Dose Increase--[name] Inject 18 units under the skin daily at bedtime & Novofine 32G tips. I have printed application-will send to [facility] for Dr. Mable Fill to sign, also mailing patient his portion to sign & return-we had previously mailed patient his portion of application 05/02/77 for the 10 units daily-previous dose-patient has not returned.[name]

## 2018-03-03 ENCOUNTER — Other Ambulatory Visit: Payer: Self-pay | Admitting: Cardiovascular Disease

## 2018-03-03 ENCOUNTER — Telehealth: Payer: Self-pay | Admitting: Pharmacist

## 2018-03-03 NOTE — Telephone Encounter (Signed)
03/03/2018 9:30:38 AM - [name] & tips pending  03/03/18 Patient in office today, I got patient to sign the [name] forms that have been mailed to him 01/23/18 & 02/27/18, patient states he as not received these in mail from [place], I did verify patient address. I also explained to patient that due to no income the [name] requires a Medicaid denial-I have one in chart dated/16/2018 explained 47 year old, he would need to go to DSS and reapply-when he receives this denial we need a copy to send to company. I explained that I would send the one I have, but the company will likely hold for a more current. He stated he needed to take care of today, he also stated he has applied for disability-this could hold up the Medicaid denial. Currently I am will be taking provider portion to [facility] this week for dose change of 18 units at bedtime for [name] & tips.[name]

## 2018-03-09 ENCOUNTER — Other Ambulatory Visit: Payer: Self-pay | Admitting: Family

## 2018-03-09 MED ORDER — METOPROLOL SUCCINATE ER 50 MG PO TB24: ORAL_TABLET | ORAL | 3 refills | Status: DC

## 2018-03-12 ENCOUNTER — Other Ambulatory Visit: Payer: Self-pay

## 2018-03-12 MED ORDER — METOPROLOL SUCCINATE ER 50 MG PO TB24: ORAL_TABLET | ORAL | 3 refills | Status: DC

## 2018-03-13 ENCOUNTER — Telehealth: Payer: Self-pay | Admitting: Pharmacist

## 2018-03-13 IMAGING — CR DG CHEST 2V
1 series · 2 of 2 positions shown · non-contrast
Comparison: 03/15/2016

CLINICAL DATA: Shortness of breath and chest pain for 3 days.

EXAM:
CHEST  2 VIEW

[Series 1: dg chest 2 view · 0.14mm/px · 2 of 2 slices shown]
[im 1/2]
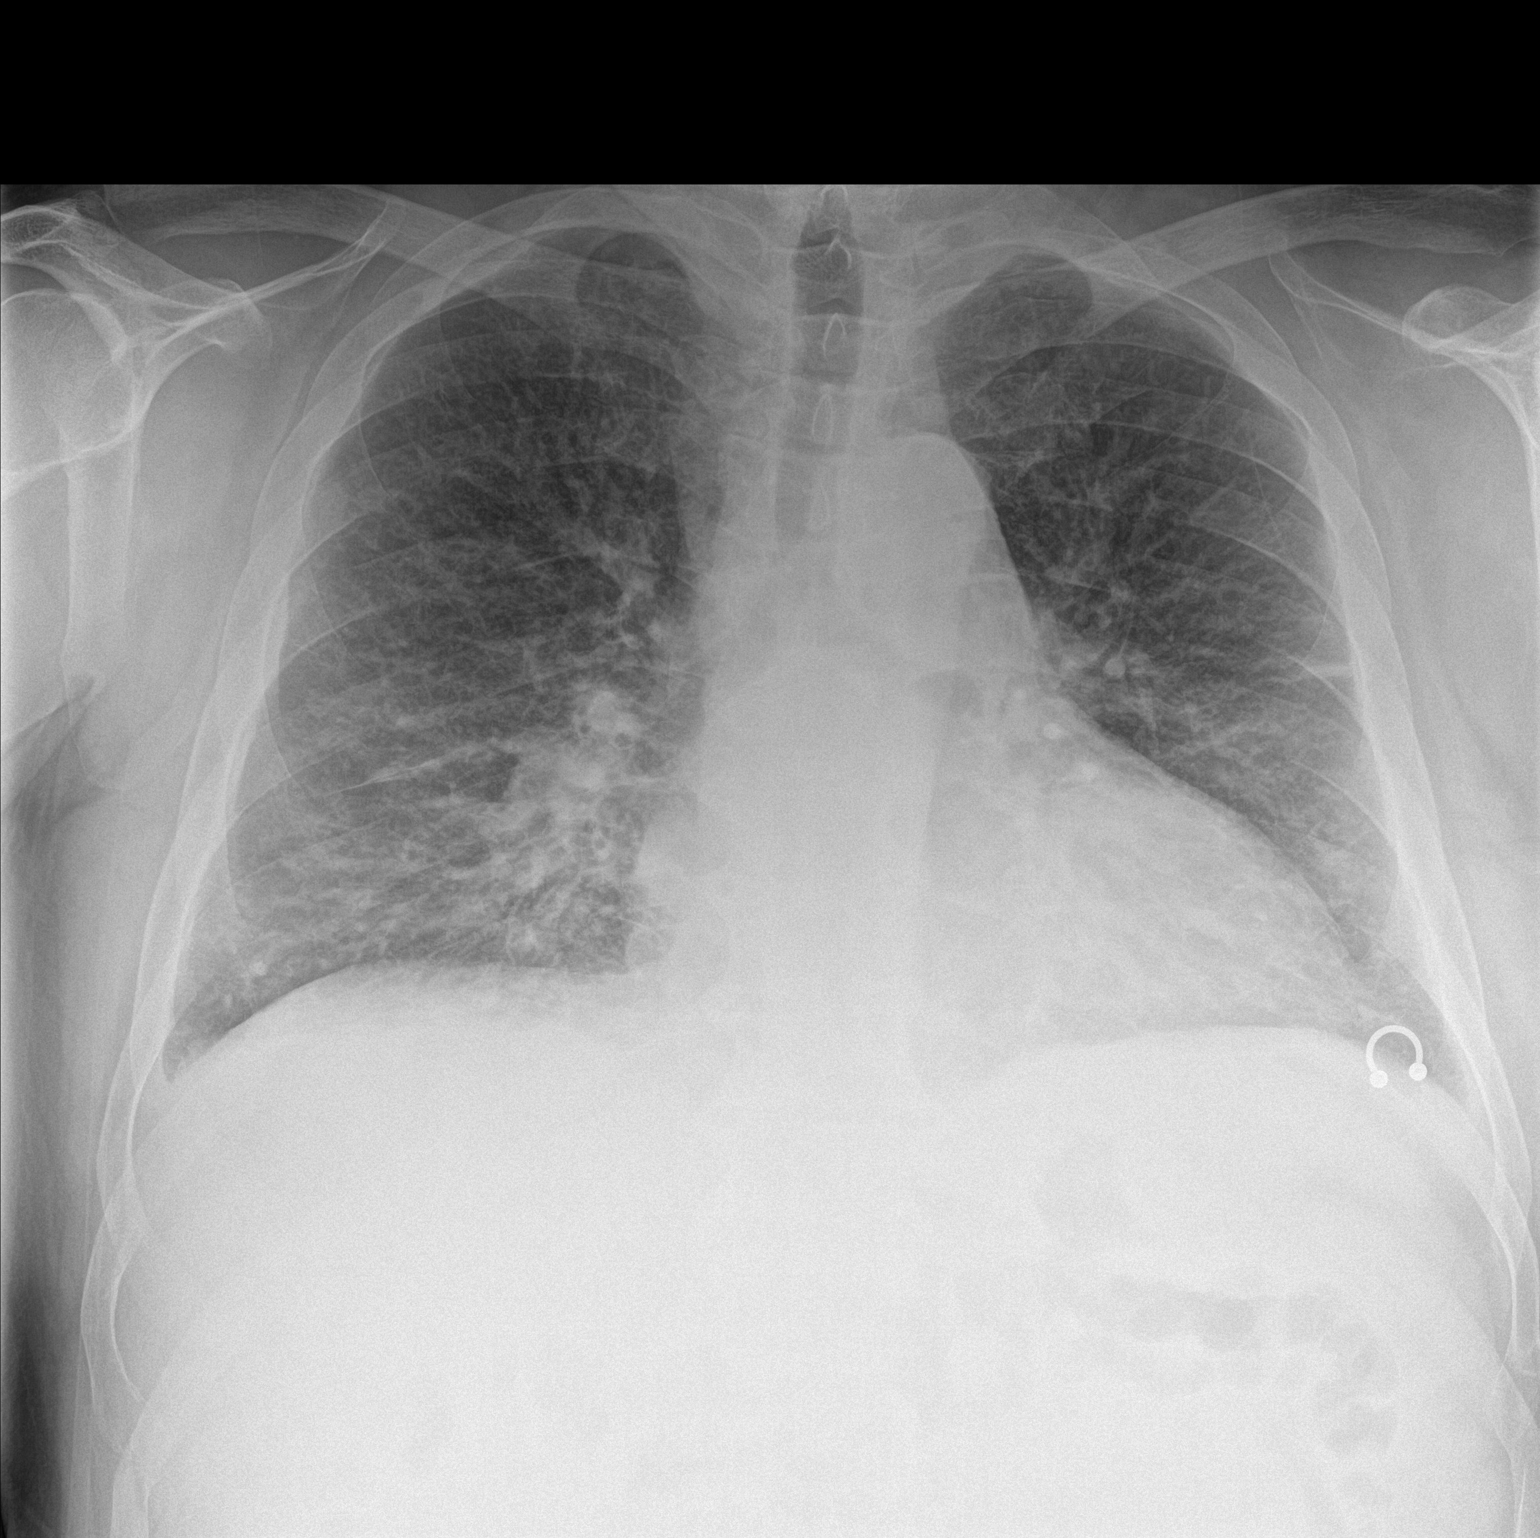
[im 2/2]
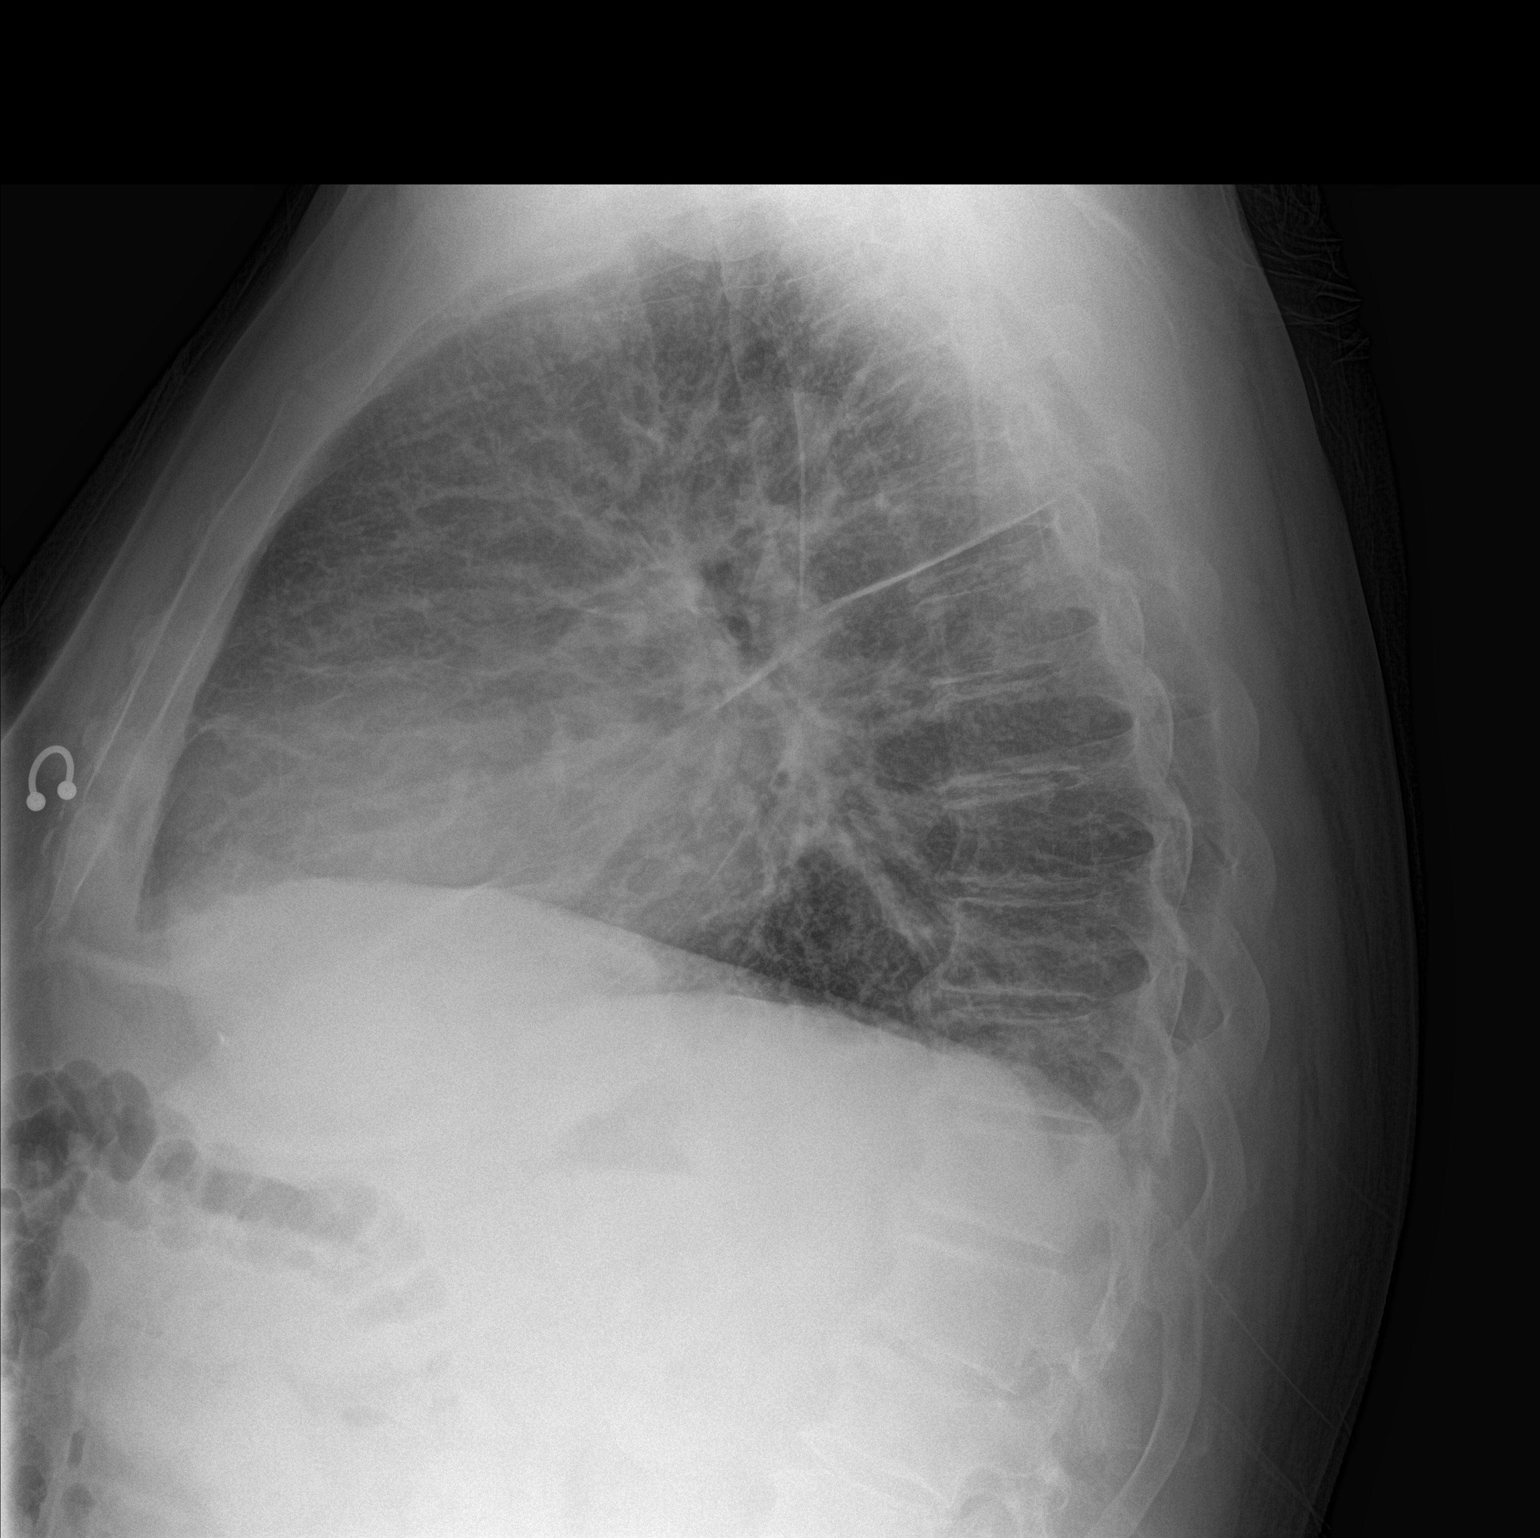

[2 of 2 positions shown; findings below may reference images not displayed]

FINDINGS: The heart is mildly enlarged but stable. There is tortuosity of the
thoracic aorta. There is peribronchial thickening and increased
interstitial markings suggesting bronchitis. Could not exclude early
right lower lobe bronchopneumonia. No pleural effusions. The bony
thorax is intact.
IMPRESSION: Cardiac enlargement, stable.

Findings suggest bronchitis and possible early right lower lobe
bronchopneumonia.

## 2018-03-13 NOTE — Telephone Encounter (Signed)
03/13/2018 10:31:33 AM - [name] & Novofine 32G tips to [facility]  03/13/18 [employer] application for [employer] Flextouch Inject 18 units under the skin daily at bedtime #2 & Novofine 32G tips use daily with [name]-patient has brought in a letter dated 03/03/18 from [place] stating Medicaid is Pending-DSS has not determined if medical condition meets the definition of Disability for Medicaid. The application will be held until DSS makes a decision. This will likely hold up shipment of medication until a decision has been made.[name]

## 2018-04-01 ENCOUNTER — Other Ambulatory Visit: Payer: Medicaid Other

## 2018-04-01 DIAGNOSIS — E119 Type 2 diabetes mellitus without complications: Secondary | ICD-10-CM

## 2018-04-02 LAB — HEMOGLOBIN A1C
Est. average glucose Bld gHb Est-mCnc: 235 mg/dL
Hgb A1c MFr Bld: 9.8 % — ABNORMAL HIGH (ref 4.8–5.6)

## 2018-04-02 LAB — CBC
Hematocrit: 43.1 % (ref 37.5–51.0)
Hemoglobin: 14.6 g/dL (ref 13.0–17.7)
MCH: 29.1 pg (ref 26.6–33.0)
MCHC: 33.9 g/dL (ref 31.5–35.7)
MCV: 86 fL (ref 79–97)
Platelets: 178 10*3/uL (ref 150–450)
RBC: 5.02 x10E6/uL (ref 4.14–5.80)
RDW: 15.7 % — ABNORMAL HIGH (ref 12.3–15.4)
WBC: 6.1 10*3/uL (ref 3.4–10.8)

## 2018-04-02 LAB — COMPREHENSIVE METABOLIC PANEL
ALT: 30 IU/L (ref 0–44)
AST: 19 IU/L (ref 0–40)
Albumin/Globulin Ratio: 1.6 (ref 1.2–2.2)
Albumin: 4.4 g/dL (ref 3.5–5.5)
Alkaline Phosphatase: 79 IU/L (ref 39–117)
BUN/Creatinine Ratio: 24 — ABNORMAL HIGH (ref 9–20)
BUN: 22 mg/dL (ref 6–24)
Bilirubin Total: 1.2 mg/dL (ref 0.0–1.2)
CO2: 20 mmol/L (ref 20–29)
Calcium: 9.6 mg/dL (ref 8.7–10.2)
Chloride: 103 mmol/L (ref 96–106)
Creatinine, Ser: 0.92 mg/dL (ref 0.76–1.27)
GFR calc Af Amer: 113 mL/min/{1.73_m2} (ref >59)
GFR calc non Af Amer: 97 mL/min/{1.73_m2} (ref >59)
Globulin, Total: 2.8 g/dL (ref 1.5–4.5)
Glucose: 231 mg/dL — ABNORMAL HIGH (ref 65–99)
Potassium: 4.4 mmol/L (ref 3.5–5.2)
Sodium: 140 mmol/L (ref 134–144)
Total Protein: 7.2 g/dL (ref 6.0–8.5)

## 2018-04-08 ENCOUNTER — Ambulatory Visit: Payer: Medicaid Other | Admitting: Internal Medicine

## 2018-04-08 VITALS — BP 170/104 | HR 84 | Temp 98.0°F | Ht 71.5 in | Wt 252.3 lb

## 2018-04-08 DIAGNOSIS — M549 Dorsalgia, unspecified: Secondary | ICD-10-CM

## 2018-04-08 DIAGNOSIS — E119 Type 2 diabetes mellitus without complications: Secondary | ICD-10-CM

## 2018-04-08 DIAGNOSIS — R200 Anesthesia of skin: Principal | ICD-10-CM

## 2018-04-08 MED ORDER — INSULIN DETEMIR 100 UNIT/ML ~~LOC~~ SOLN: 20.0000 [IU] | Freq: Every day | SUBCUTANEOUS | 3 refills | Status: DC

## 2018-04-08 MED ORDER — METFORMIN HCL 500 MG PO TABS: ORAL_TABLET | ORAL | 3 refills | Status: DC

## 2018-04-08 NOTE — Progress Notes (Signed)
Subjective:    Patient ID: Gabriel [name]., male    DOB: [birth year], 47 y.o.   MRN: [id number]  HPI  Pt reports that he is experiencing intense pain in his L leg with the pain originating in the lower back and radiates to leg. Pain intensifies if he sits or walks for to long, pain eases when laying down. Pt reports that this has happened in his R leg as well.  Pt reports checking FSBS once a day in the morning and th readings usually run in between 160's-170's    Review of Systems   Patient Active Problem List   Diagnosis Date Noted  . Chronic systolic heart failure ([place]) 09/02/2017  . Chest pain 08/24/2017  . Dizziness   . Noncompliance with medications 04/29/2017  . Back pain 03/06/2017  . Tobacco use 12/04/2016  . Gallbladder sludge 12/04/2016  . Coronary artery disease, non-occlusive 03/15/2016  . Atypical chest pain 02/16/2016  . Orthostatic hypotension 01/23/2016  . Hypokalemia 01/23/2016  . Hypomagnesemia 01/23/2016  . Congestive dilated cardiomyopathy ([place]) 01/17/2016  . NSVT (nonsustained ventricular tachycardia) ([place])   . Elevated troponin   . Diabetes ([place]) 05/17/2015  . Hyperlipidemia 05/17/2015  . COPD (chronic obstructive pulmonary disease) ([place]) 05/17/2015  . HTN (hypertension) 03/31/2015   Allergies as of 04/08/2018      Reactions   Prednisone Other (See Comments)   Reaction: Hallucinations       Medication List        Accurate as of 04/08/18  9:42 AM. Always use your most recent med list.          aspirin 81 MG chewable tablet Chew 1 tablet (81 mg total) by mouth daily. Reported on 02/08/2016   gabapentin 300 MG capsule Commonly known as:  NEURONTIN Take 1 capsule (300 mg total) by mouth 3 (three) times daily.   insulin detemir 100 UNIT/ML injection Commonly known as:  LEVEMIR Inject 0.18 mLs (18 Units total) into the skin at bedtime.   metFORMIN 500 MG tablet Commonly known as:  GLUCOPHAGE Take 1 tablet (500 mg total) by mouth 2 (two)  times daily.   metoprolol succinate 50 MG 24 hr tablet Commonly known as:  TOPROL-XL Take 2 tablets AM and 1 tablet PM   ondansetron 4 MG tablet Commonly known as:  ZOFRAN Take 1 tablet (4 mg total) by mouth every 8 (eight) hours as needed for nausea or vomiting.   potassium chloride 10 MEQ tablet Commonly known as:  K-DUR TAKE ONE TABLET BY MOUTH EVERY DAY   ranitidine 150 MG tablet Commonly known as:  ZANTAC Take 1 tablet (150 mg total) by mouth 2 (two) times daily.   rosuvastatin 20 MG tablet Commonly known as:  CRESTOR Take 20 mg by mouth daily.   sacubitril-valsartan 97-103 MG Commonly known as:  ENTRESTO Take 1 tablet by mouth 2 (two) times daily.   spironolactone 25 MG tablet Commonly known as:  ALDACTONE Take 1 tablet (25 mg total) by mouth daily.   torsemide 20 MG tablet Commonly known as:  DEMADEX Take 1 tablet (20 mg total) by mouth daily.          Objective:   Physical Exam  Constitutional: He is oriented to person, place, and time.  Cardiovascular: Normal rate, regular rhythm and normal heart sounds.  Pulmonary/Chest: Effort normal and breath sounds normal.  Neurological: He is alert and oriented to person, place, and time.   Pulses intact in Left leg, no pitting edema.  Appears to have slightly decreased reflexes in left lower extremities.  BP (!) 170/104   Pulse 84   Temp 98 F (36.7 C) (Oral)   Ht 5' 11.5" (1.816 m)   Wt 252 lb 4.8 oz (114.4 kg)   BMI 34.70 kg/m    BP recorded in exam room _0 :50AM 160/100     Assessment & Plan:   Lower Back Pain and Peripheral Neuropathy Order MRI of lower back to assess possible bulging disc syndrome for hischeck for cause of unspecified pain in lower back and legs. Pt needs to be counseled on [facility].   Type 2 Diabetes  Increase Levemir to 20 units at night and increase Metformin to 1000 mg in morning and 500 mg at night (3 tablets a day). Refferal to [facility] clinic for diabetes and  peripheral neuropathy.   Return in 3 months with labs one week prior (a1C, Met C, CBC, UA).

## 2018-04-21 IMAGING — CT CT CHEST W/ CM
2 of 3 series · 15 of 36 positions shown, 18 images · IV contrast (iopamidol)
Comparison: 02/15/2016

CLINICAL DATA: Cough and shortness of breath since [REDACTED]. Symptoms
since cleaning house with bleach.

EXAM:
CT CHEST WITH CONTRAST
TECHNIQUE: Multidetector CT imaging of the chest was performed during
intravenous contrast administration.
CONTRAST:  75mL OQHTBM-Q88 IOPAMIDOL (OQHTBM-Q88) INJECTION 61%

[Series 2: axial st · axial · 0.78mm/px · z∈[-446,-146]mm · 12 of 178 slices shown, 15 images]
[im 14/178  mediastinal]
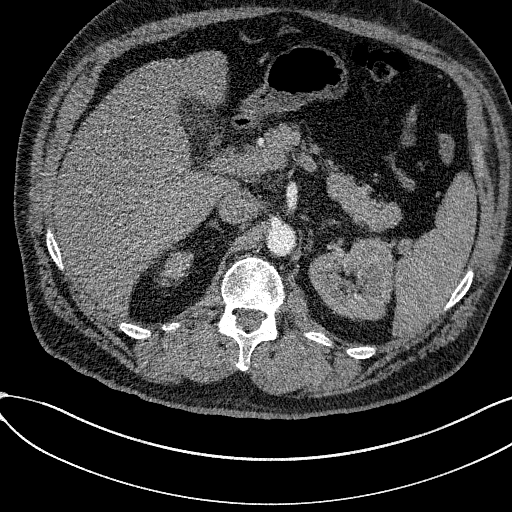
[im 14/178  lung]
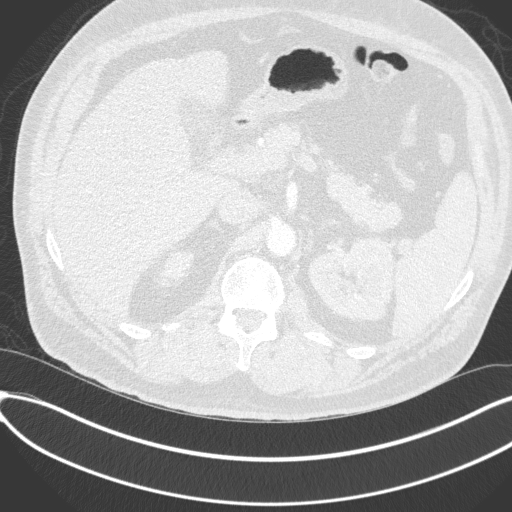
[im 27/178  lung]
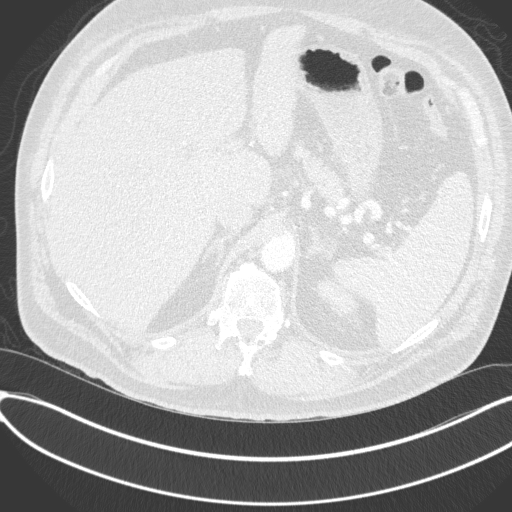
[im 40/178  lung]
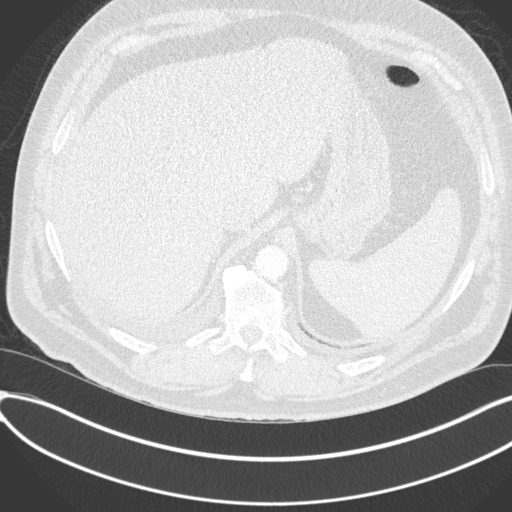
[im 53/178  lung]
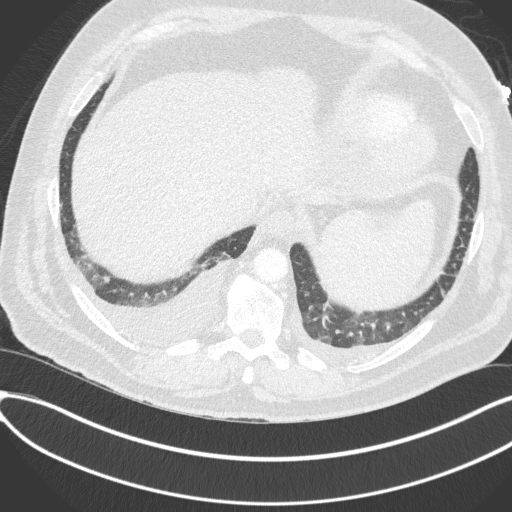
[im 66/178  mediastinal]
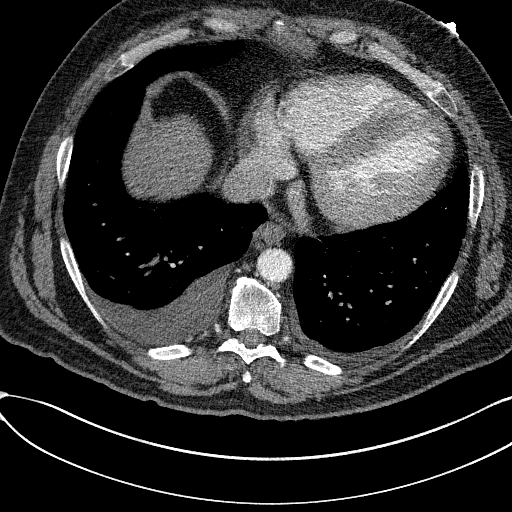
[im 66/178  lung]
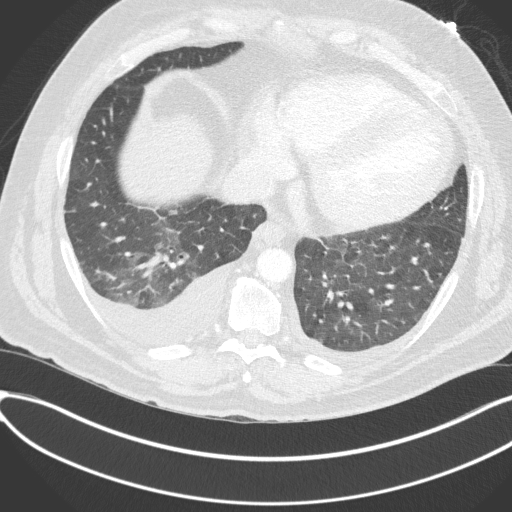
[im 79/178  lung]
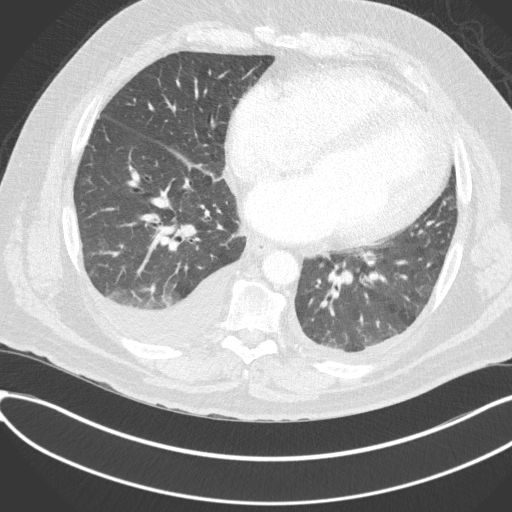
[im 99/178  lung]
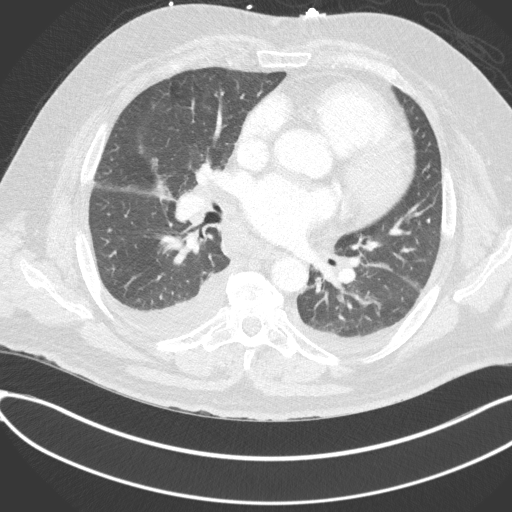
[im 112/178  lung]
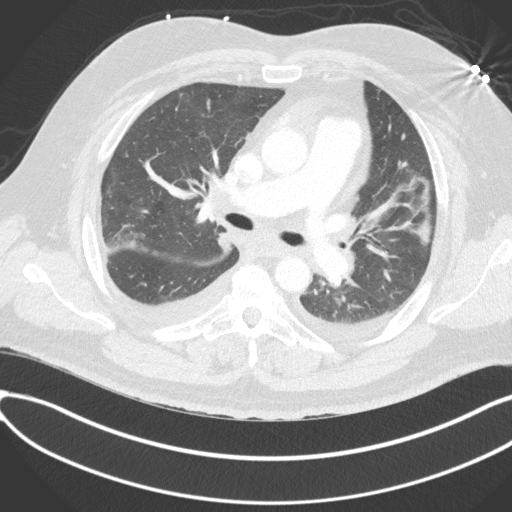
[im 125/178  mediastinal]
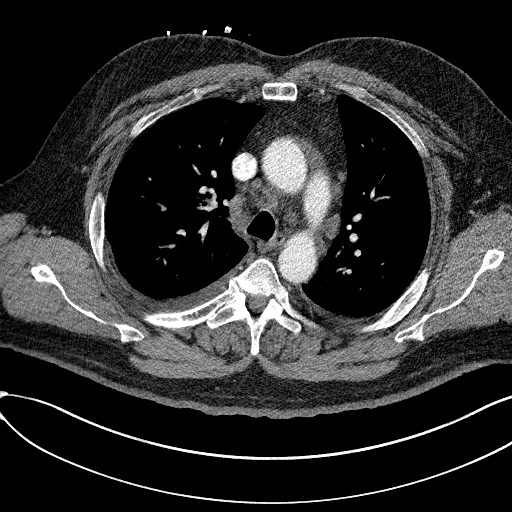
[im 125/178  lung]
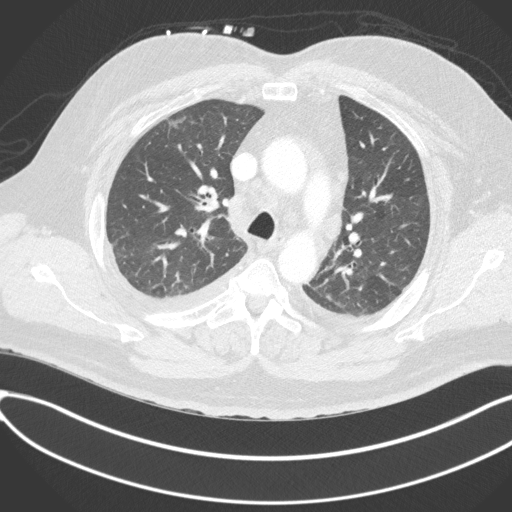
[im 138/178  lung]
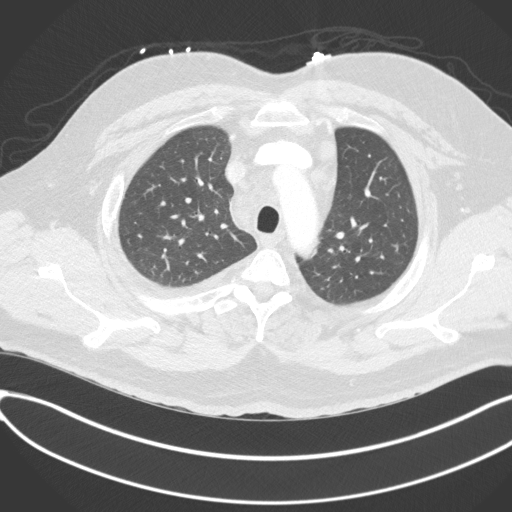
[im 151/178  lung]
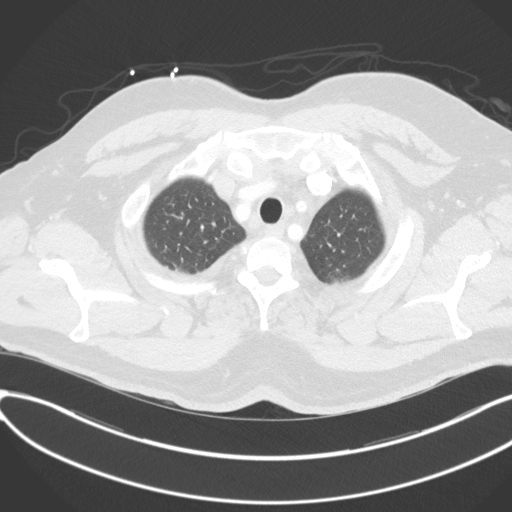
[im 164/178  lung]
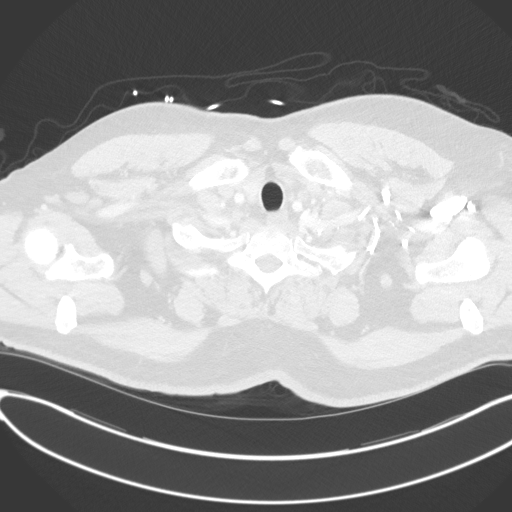

[Series 5: coronal · coronal · 0.74mm/px · 3 of 164 slices shown]
[im 33/164  lung]
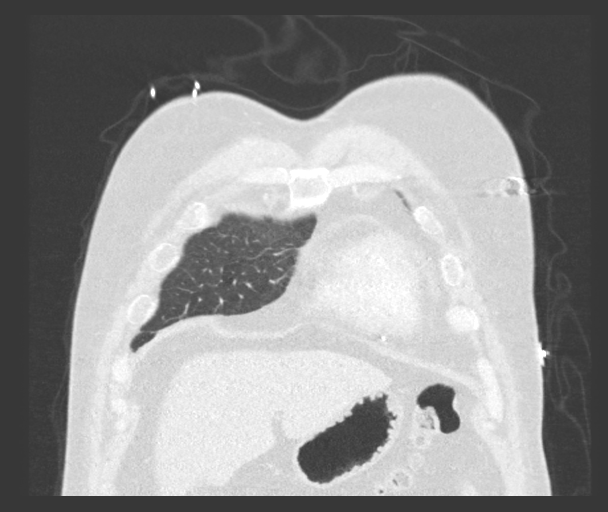
[im 66/164  lung]
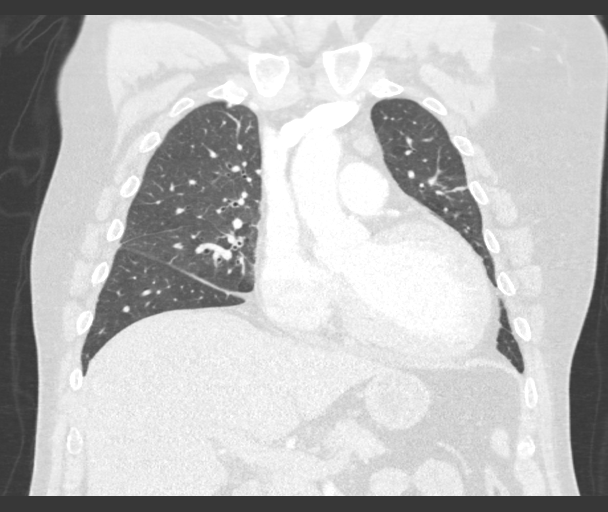
[im 98/164  lung]
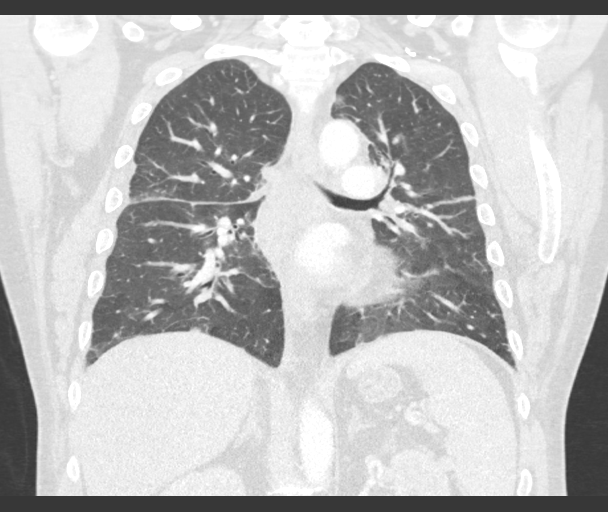

[15 of 36 positions shown; findings below may reference images not displayed]

FINDINGS: Cardiovascular: Mild cardiomegaly. No pericardial effusion.
Extensive atherosclerosis for age, including the coronaries. No
acute vascular finding

Mediastinum/Nodes: Chronic mediastinal adenopathy. Right
paratracheal lymph node measures 16 mm short axis. Sub- carina node
eccentric to the right measures 2 cm short axis. No progression
calcification.

Lungs/Pleura: Small bilateral layering pleural effusion and mild
septal thickening. These findings have been seen previously. Minimal
atelectasis. No air bronchogram. Mild mosaic attenuation the lungs
concha often from air trapping. No definitive alveolitis to
correlate with the history.

Upper Abdomen: No acute abnormality.

Musculoskeletal: No acute finding
IMPRESSION: 1. Small layering pleural effusions and mild septal thickening,
consider CHF.
2. Age advanced coronary atherosclerosis.
3. Chronic mediastinal adenopathy, stable since at least 7612.

## 2018-04-21 IMAGING — CR DG CHEST 2V
2 series · 2 of 2 positions shown · non-contrast
Comparison: 09/19/2016

CLINICAL DATA: Shortness of breath since [REDACTED], nonproductive
cough

EXAM:
CHEST  2 VIEW

[chest pa]
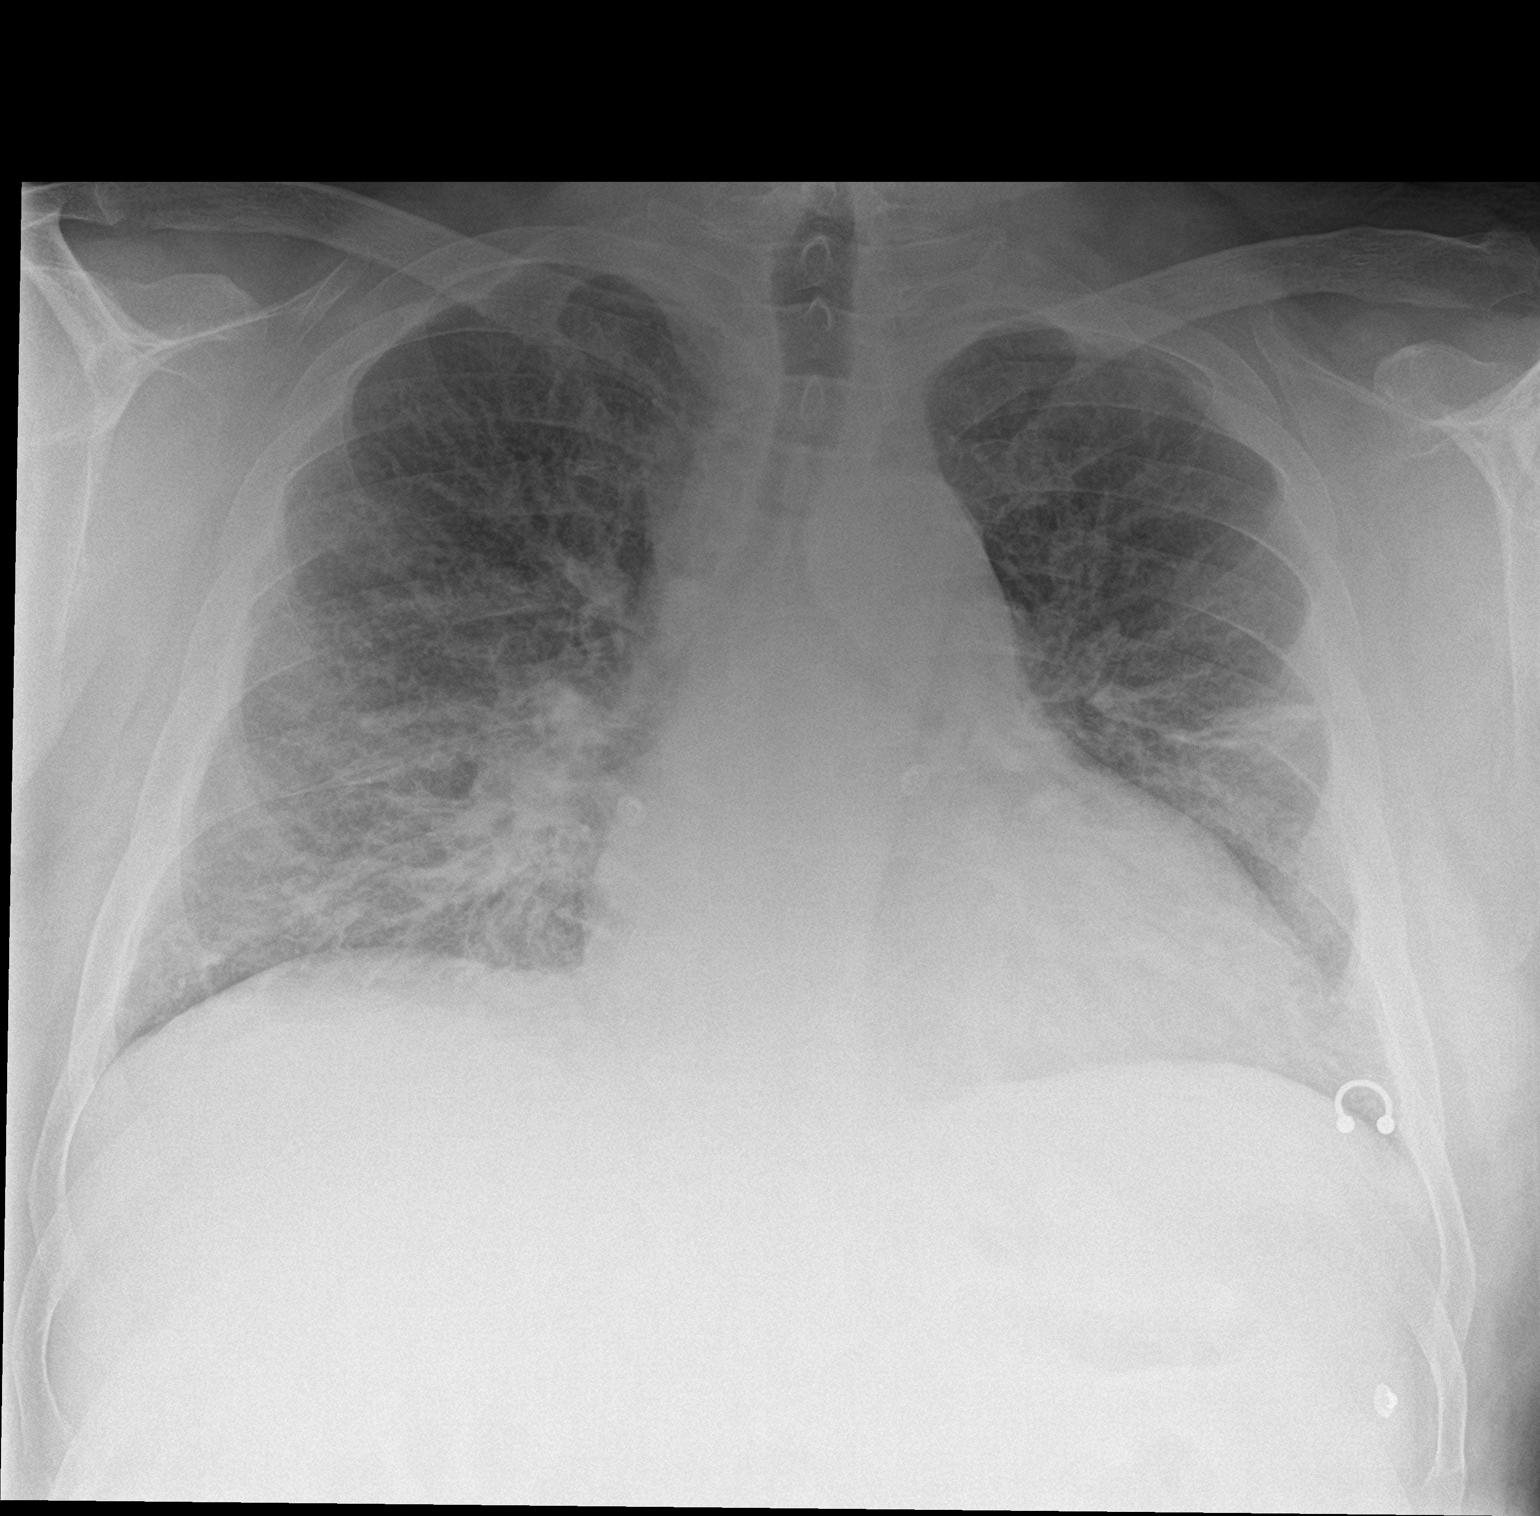

[chest lat]
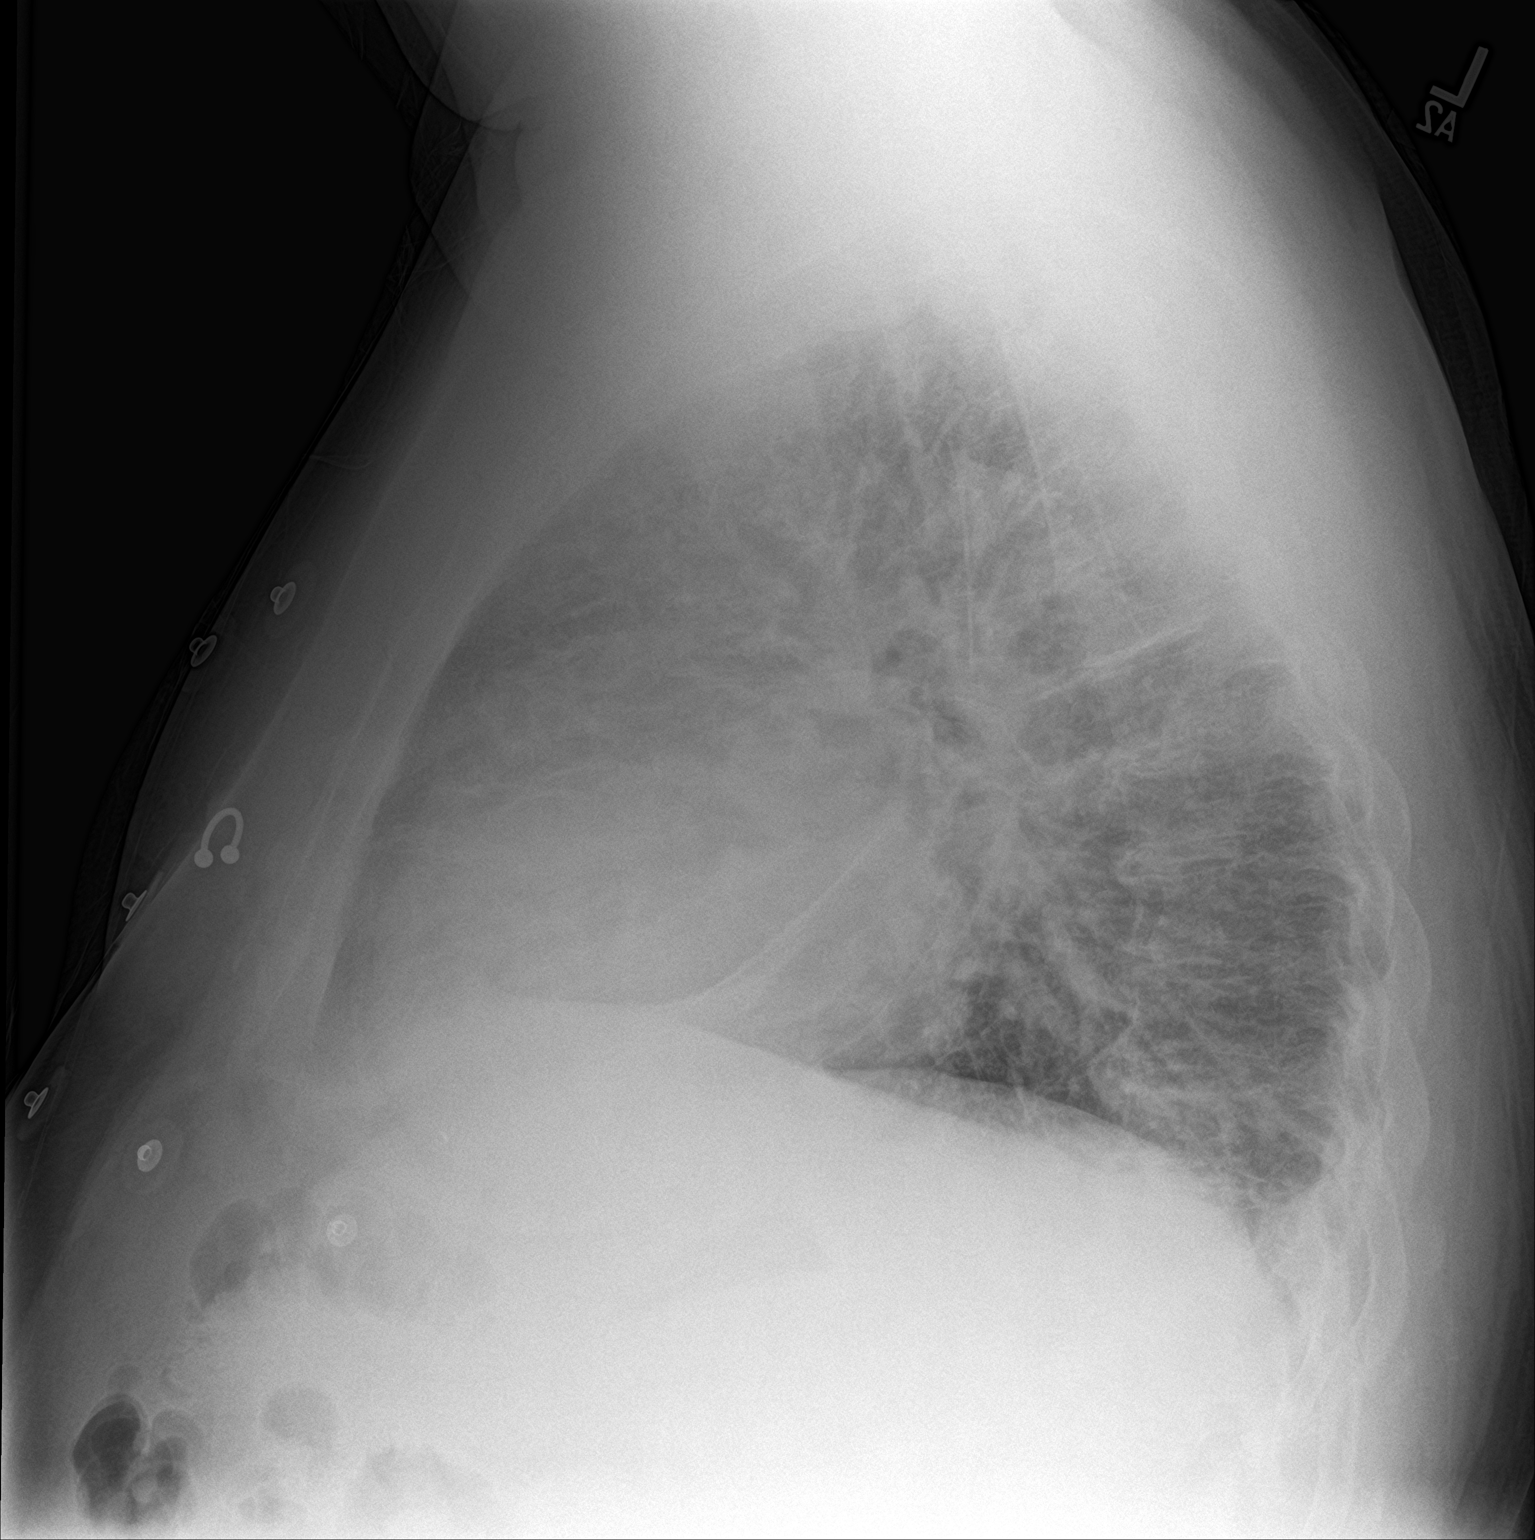

[2 of 2 positions shown; findings below may reference images not displayed]

FINDINGS: Stable mild cardiomegaly. There is peribronchial thickening and
increased interstitial markings throughout the lungs, slightly
increasing since prior study. This could represent bronchitis or
interstitial edema. No effusions. No acute bony abnormality.
IMPRESSION: Continued peribronchial thickening. Increasing interstitial
prominence could reflect worsening bronchitis and/or interstitial
edema/CHF.

## 2018-04-29 ENCOUNTER — Ambulatory Visit: Payer: Self-pay | Admitting: Family

## 2018-05-05 ENCOUNTER — Ambulatory Visit: Payer: Self-pay | Admitting: Family

## 2018-05-05 ENCOUNTER — Telehealth: Payer: Self-pay | Admitting: Family

## 2018-05-05 NOTE — Telephone Encounter (Signed)
Patient did not show for his [facility] appointment on 05/05/18. Will attempt to reschedule.

## 2018-05-22 IMAGING — CR DG CHEST 2V
1 series · 2 of 2 positions shown · non-contrast
Comparison: 10/28/2016

CLINICAL DATA: Dyspnea

EXAM:
CHEST  2 VIEW

[Series 1: dg chest 2 view · 0.14mm/px · 2 of 2 slices shown]
[im 1/2]
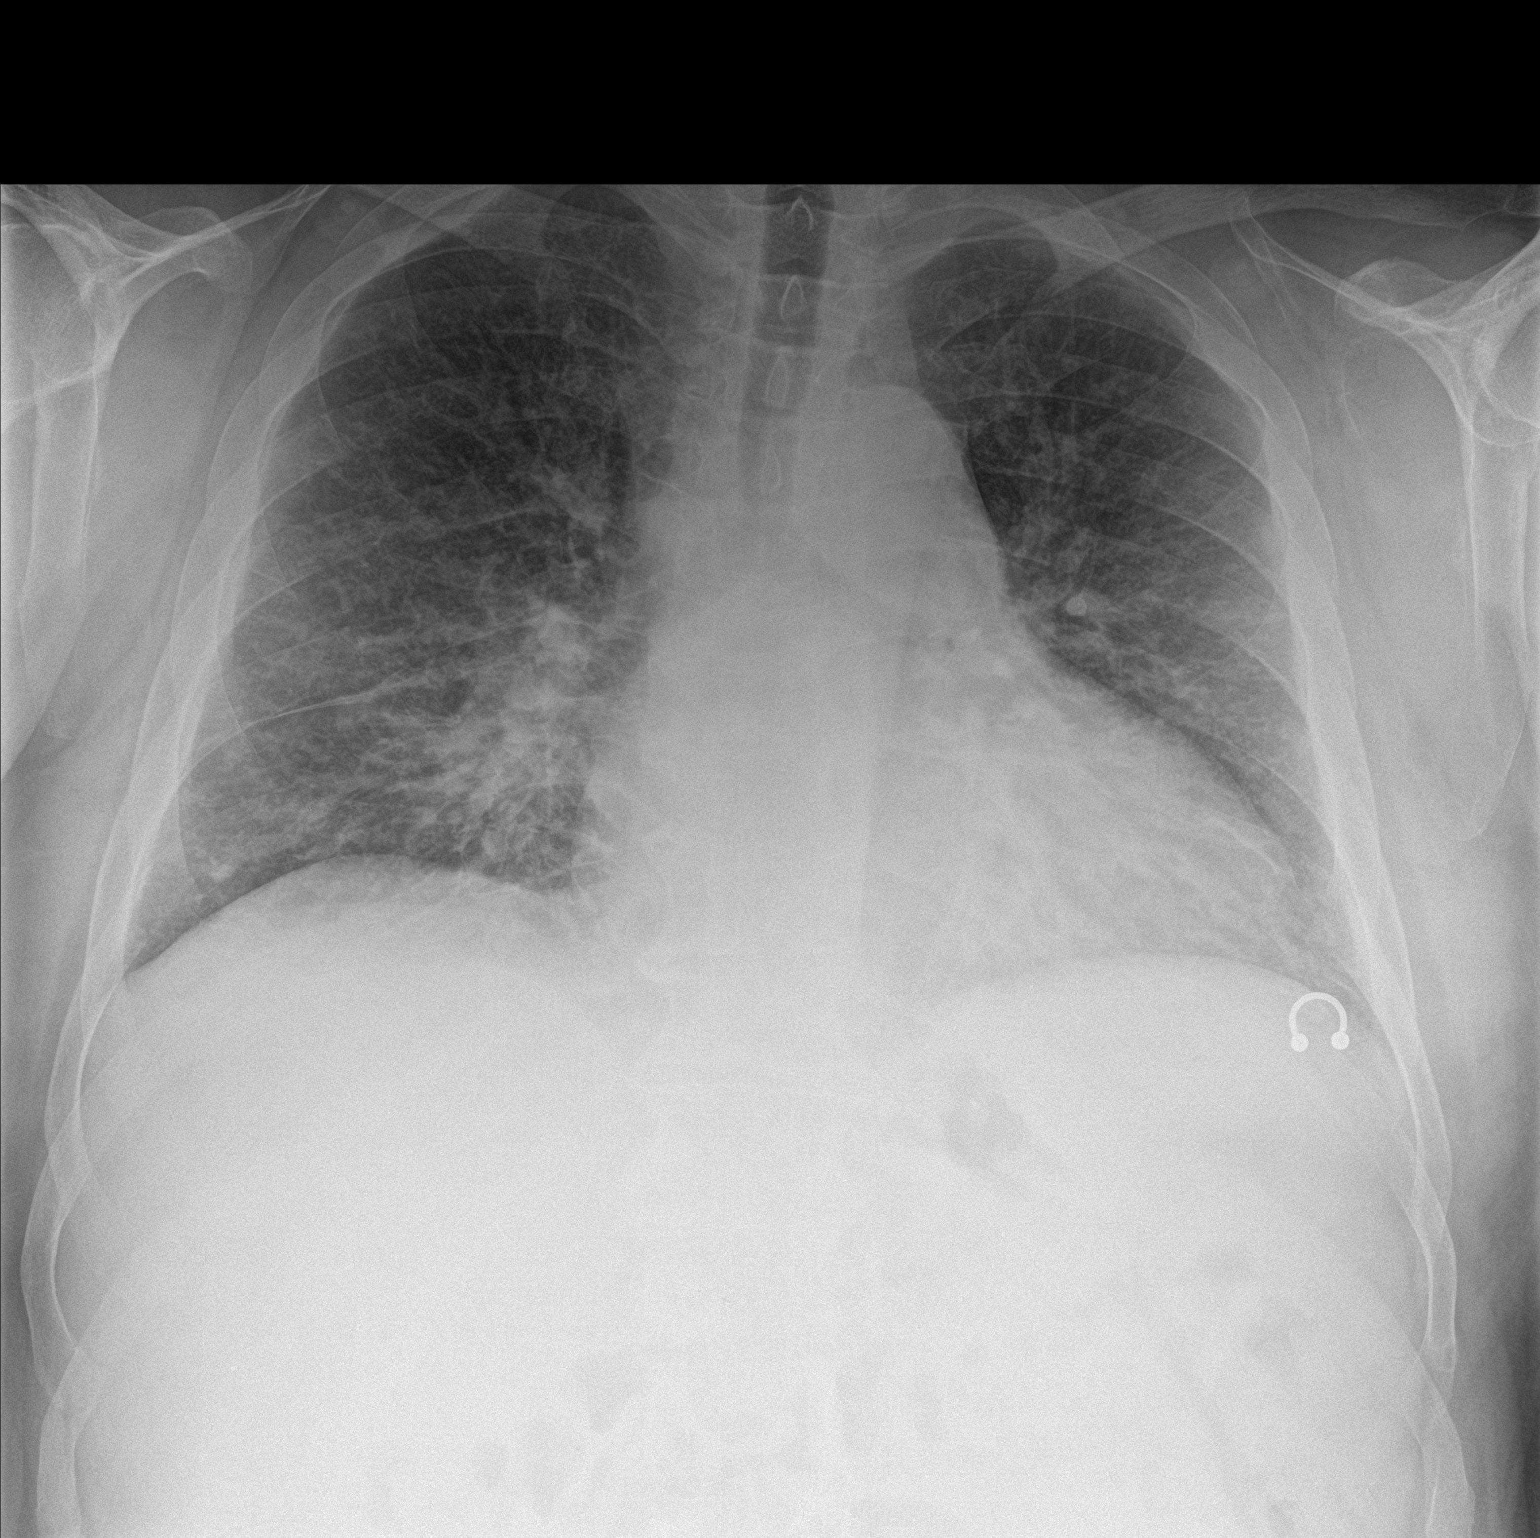
[im 2/2]
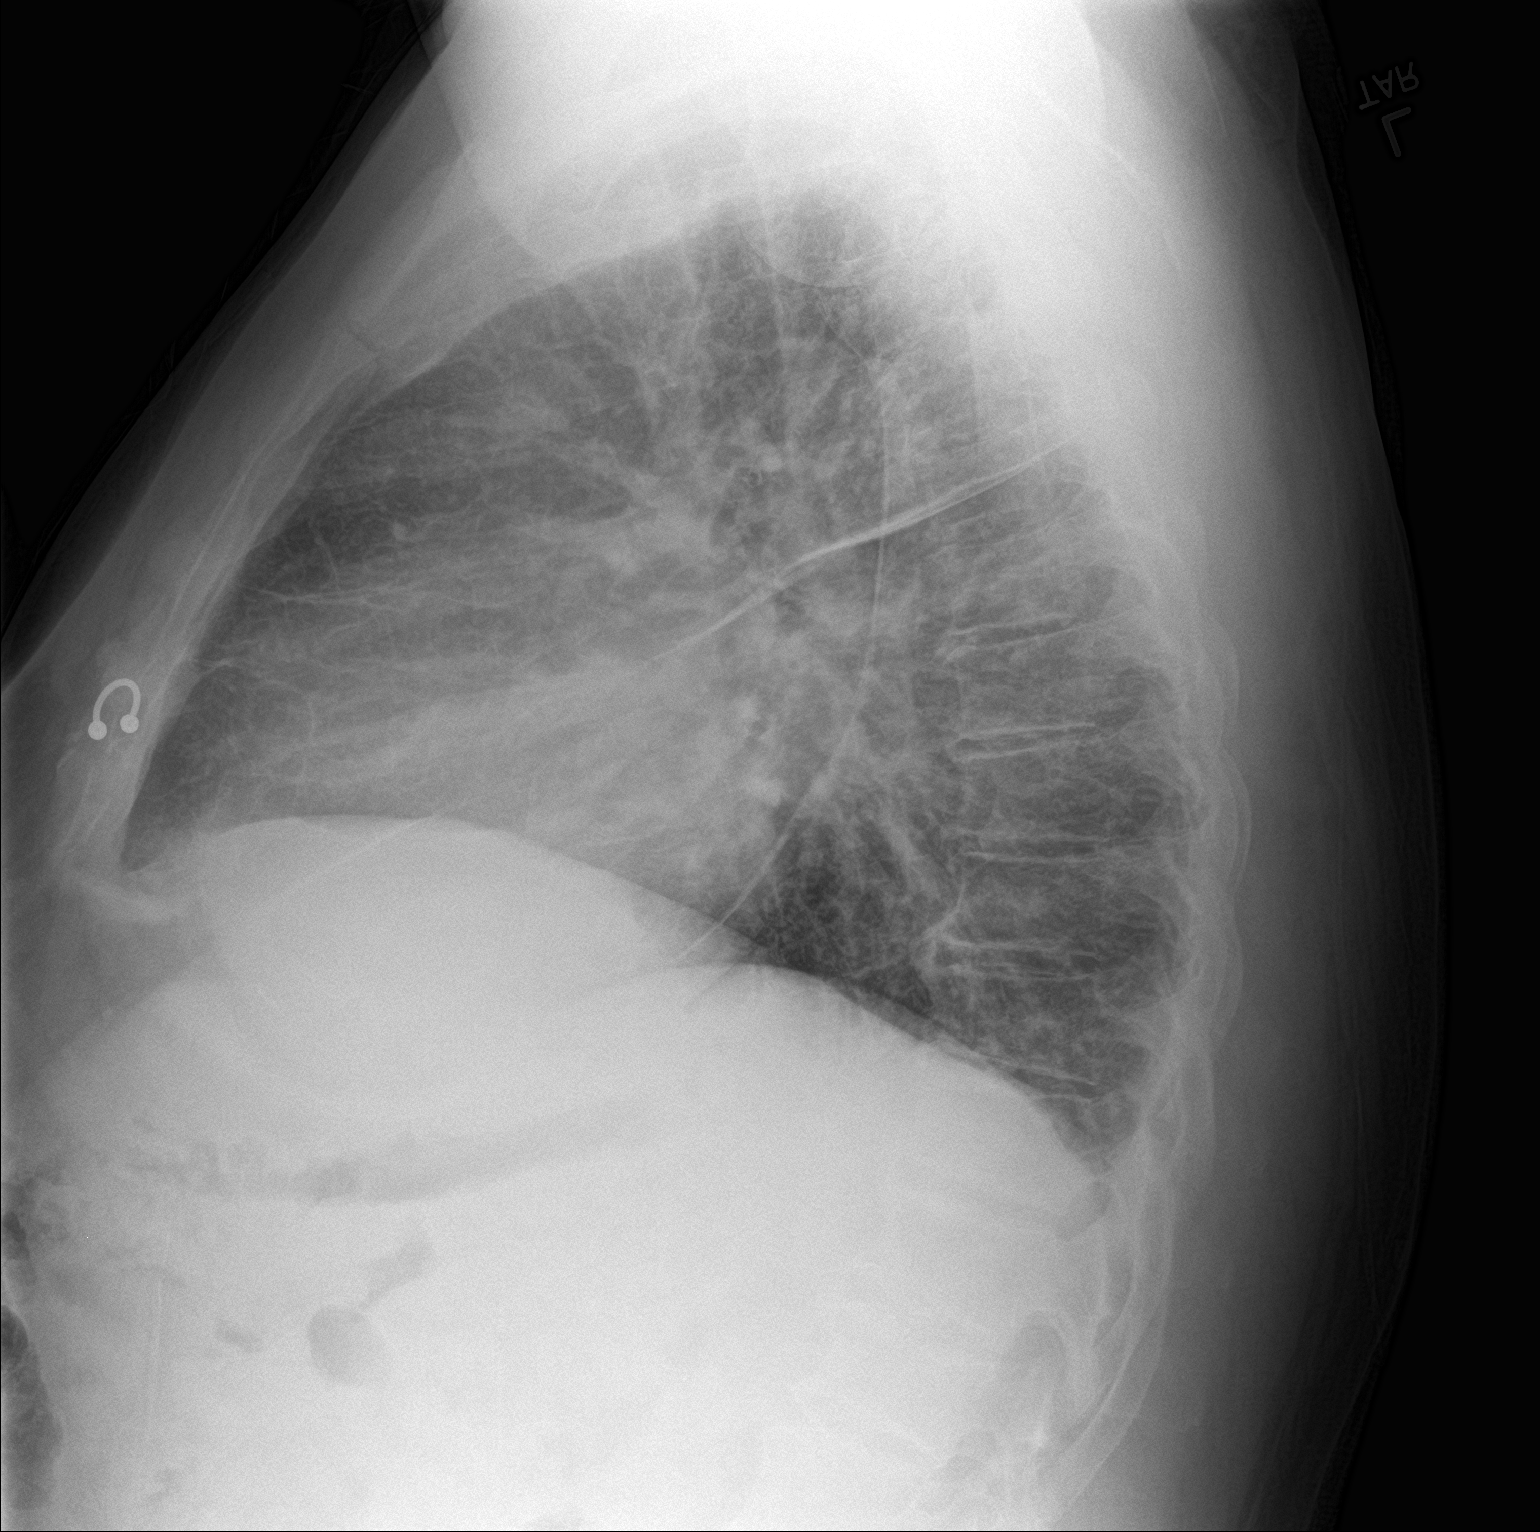

[2 of 2 positions shown; findings below may reference images not displayed]

FINDINGS: Cardiomegaly and vascular pedicle widening. Interstitial coarsening
with Kerley lines and fissural thickening. No noted pleural
effusions.
IMPRESSION: CHF.

## 2018-05-30 IMAGING — CT CT ABD-PELV W/ CM
2 of 5 series · 16 of 46 positions shown, 18 images · IV contrast (iopamidol)
Comparison: Ultrasound from earlier in the same day

CLINICAL DATA: Upper abdominal pain

EXAM:
CT ABDOMEN AND PELVIS WITH CONTRAST
TECHNIQUE: Multidetector CT imaging of the abdomen and pelvis was performed
using the standard protocol following bolus administration of
intravenous contrast.
CONTRAST:  100mL ED75PU-LBB IOPAMIDOL (ED75PU-LBB) INJECTION 61%

[Series 2: axial st · axial · 0.94mm/px · z∈[+383,+853]mm · 13 of 106 slices shown, 15 images]
[im 6/106  soft-tissue]
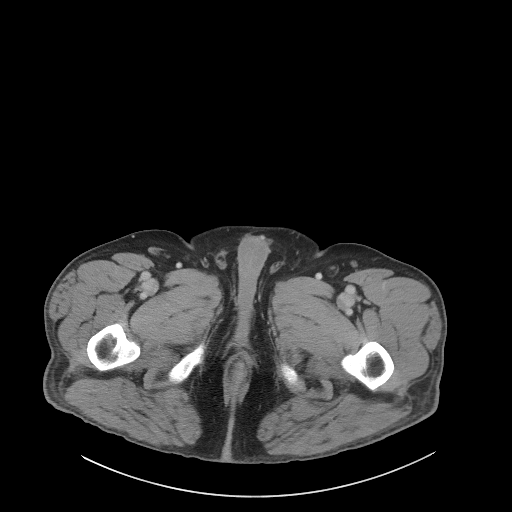
[im 6/106  bone]
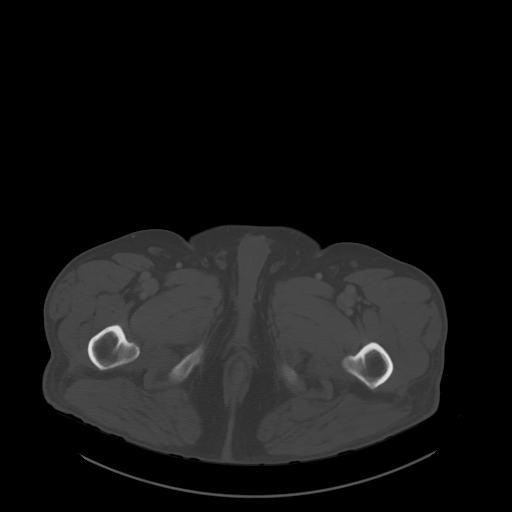
[im 16/106  soft-tissue]
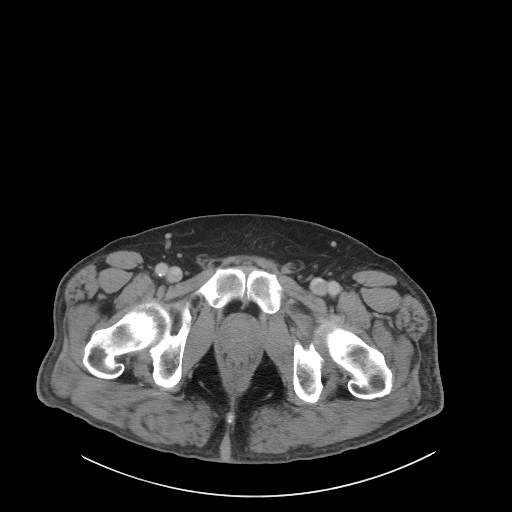
[im 22/106  soft-tissue]
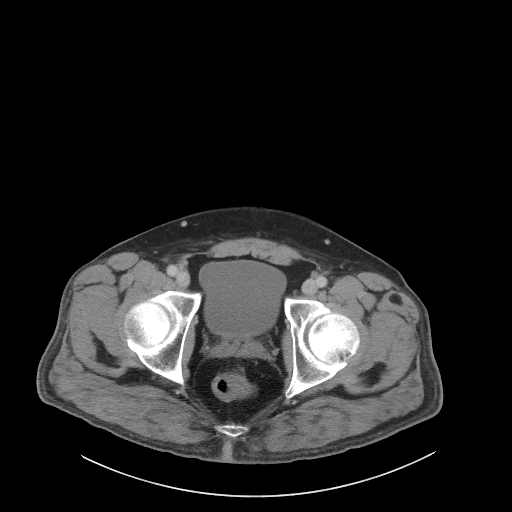
[im 32/106  soft-tissue]
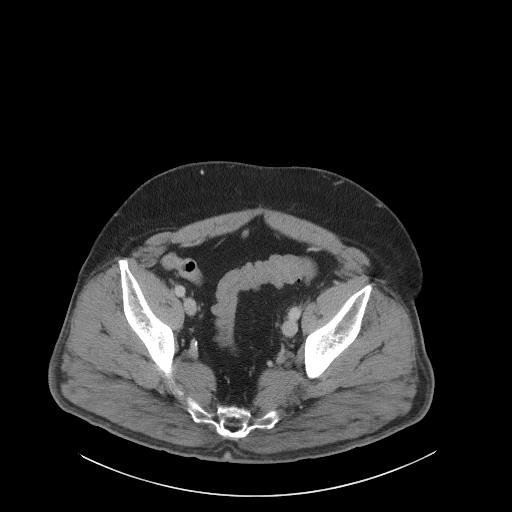
[im 37/106  soft-tissue]
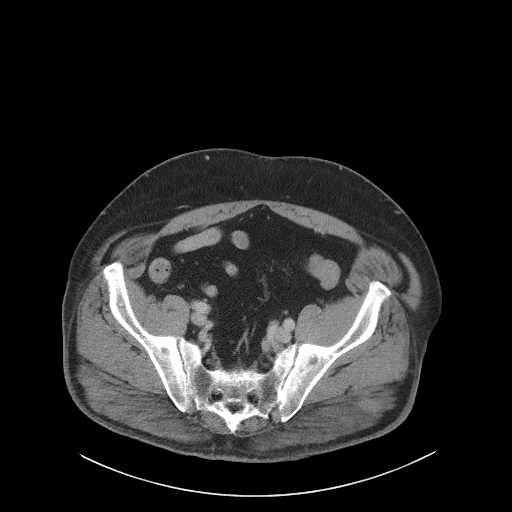
[im 48/106  soft-tissue]
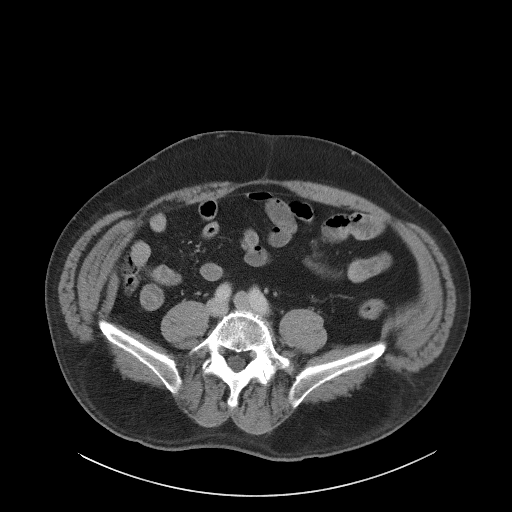
[im 53/106  soft-tissue]
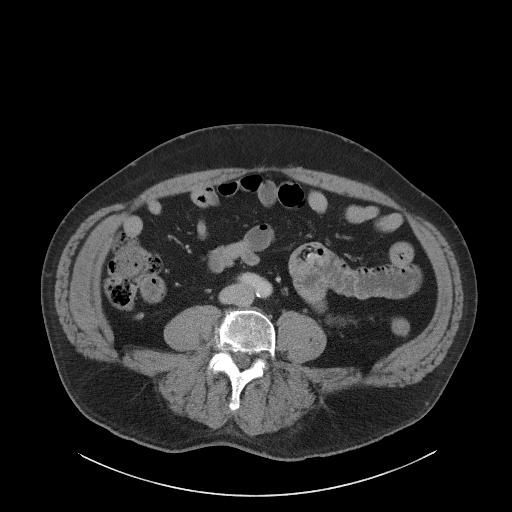
[im 58/106  soft-tissue]
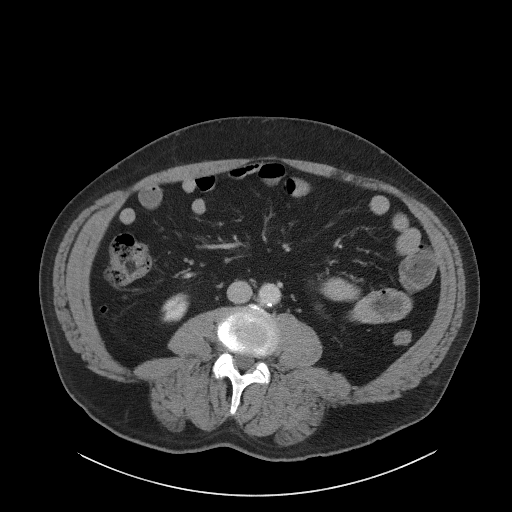
[im 69/106  soft-tissue]
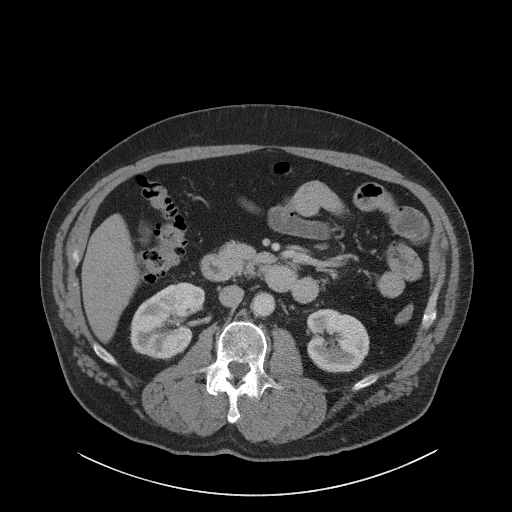
[im 69/106  bone]
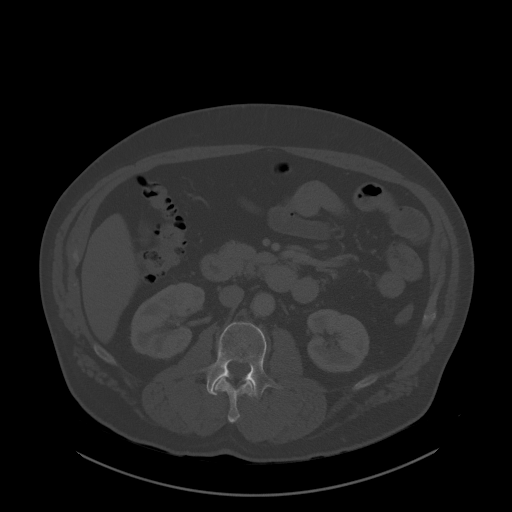
[im 74/106  soft-tissue]
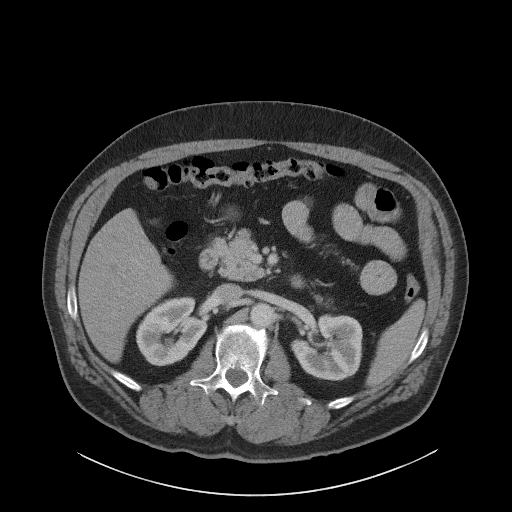
[im 85/106  soft-tissue]
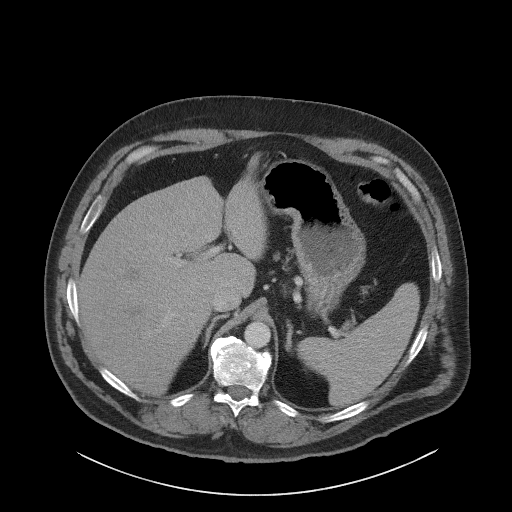
[im 90/106  soft-tissue]
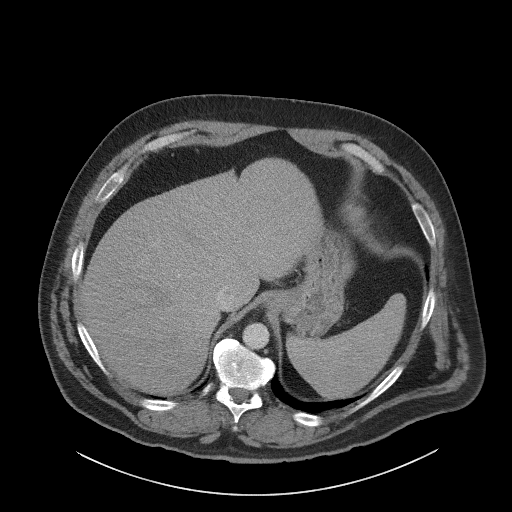
[im 100/106  soft-tissue]
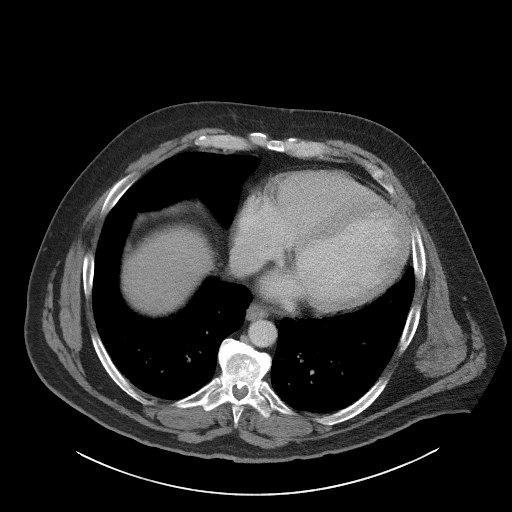

[Series 5: coronal st · coronal · 0.79mm/px · 3 of 105 slices shown]
[im 35/105  soft-tissue]
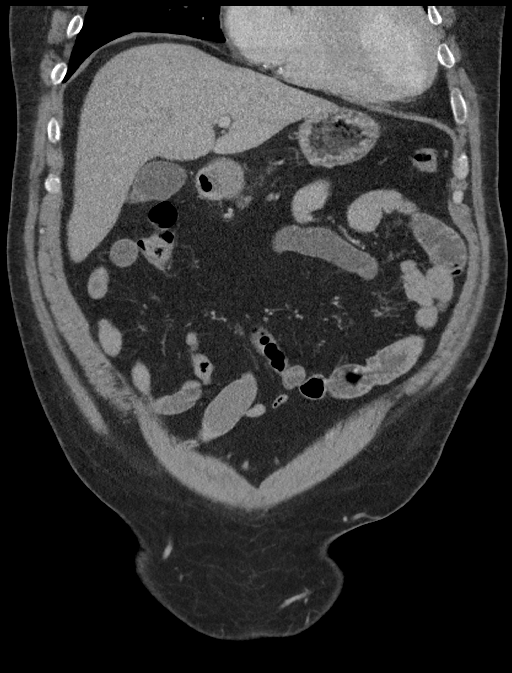
[im 47/105  soft-tissue]
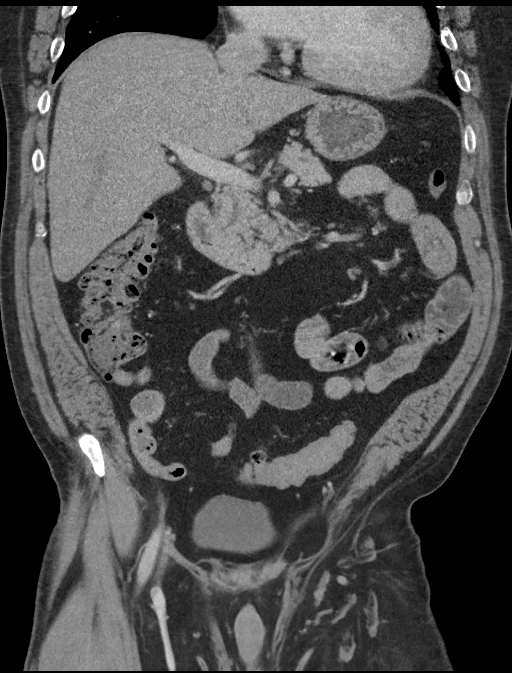
[im 58/105  soft-tissue]
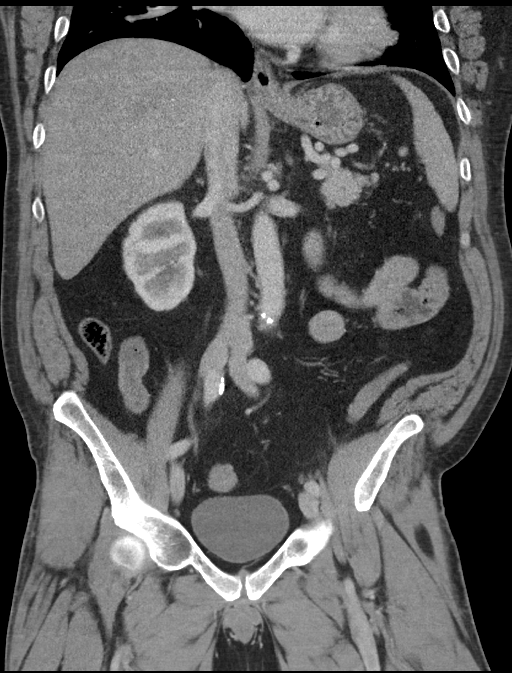

[16 of 46 positions shown; findings below may reference images not displayed]

FINDINGS: Lower chest: No acute abnormality.

Hepatobiliary: No focal liver abnormality is seen. No gallstones,
gallbladder wall thickening, or biliary dilatation.

Pancreas: Unremarkable. No pancreatic ductal dilatation or
surrounding inflammatory changes.

Spleen: Normal in size without focal abnormality.

Adrenals/Urinary Tract: The adrenal glands are within normal limits.
The right kidney demonstrates a normal enhancement pattern with the
exception of a few small cysts. Normal enhancement is noted on the
left although a tiny nonobstructing stone is noted in the lower pole
of the left kidney. The ureters are within normal limits. The
bladder is well distended.

Stomach/Bowel: Stomach is within normal limits. Appendix appears
normal. No evidence of bowel wall thickening, distention, or
inflammatory changes.

Vascular/Lymphatic: Aortic atherosclerosis. No enlarged abdominal or
pelvic lymph nodes.

Reproductive: Prostate is unremarkable.

Other: No abdominal wall hernia or abnormality. No abdominopelvic
ascites.

Musculoskeletal: No acute or significant osseous findings.
IMPRESSION: Tiny nonobstructing left renal stone.

No acute abnormality noted.

## 2018-06-02 ENCOUNTER — Ambulatory Visit: Payer: Medicaid Other

## 2018-06-17 ENCOUNTER — Ambulatory Visit: Payer: Medicaid Other | Admitting: Internal Medicine

## 2018-06-17 ENCOUNTER — Encounter: Payer: Self-pay | Admitting: Internal Medicine

## 2018-06-17 VITALS — BP 188/115 | HR 78 | Temp 98.3°F | Ht 73.0 in | Wt 248.6 lb

## 2018-06-17 DIAGNOSIS — I5022 Chronic systolic (congestive) heart failure: Secondary | ICD-10-CM

## 2018-06-17 DIAGNOSIS — I10 Essential (primary) hypertension: Secondary | ICD-10-CM

## 2018-06-17 DIAGNOSIS — E119 Type 2 diabetes mellitus without complications: Secondary | ICD-10-CM

## 2018-06-17 NOTE — Progress Notes (Signed)
Subjective:    Patient ID: Gabriel [name]., male    DOB: 03-25-1971, 47 y.o.   MRN: [id number]  HPI  Chief Complaint  Patient presents with  . Follow-up    Diabetic, needs labs checked, reports chronic lower back pain   Pt reports checking FSBS in mornings with a typical range of 130-140.  Pt reports that he is stressed about trying to receive disability.   Review of Systems  Patient Active Problem List   Diagnosis Date Noted  . Chronic systolic heart failure ([place]) 09/02/2017  . Chest pain 08/24/2017  . Dizziness   . Noncompliance with medications 04/29/2017  . Back pain 03/06/2017  . Tobacco use 12/04/2016  . Gallbladder sludge 12/04/2016  . Coronary artery disease, non-occlusive 03/15/2016  . Atypical chest pain 02/16/2016  . Orthostatic hypotension 01/23/2016  . Hypokalemia 01/23/2016  . Hypomagnesemia 01/23/2016  . Congestive dilated cardiomyopathy ([place]) 01/17/2016  . NSVT (nonsustained ventricular tachycardia) ([place])   . Elevated troponin   . Diabetes ([place]) 05/17/2015  . Hyperlipidemia 05/17/2015  . COPD (chronic obstructive pulmonary disease) ([place]) 05/17/2015  . HTN (hypertension) 03/31/2015   Allergies as of 06/17/2018      Reactions   Prednisone Other (See Comments)   Reaction: Hallucinations       Medication List        Accurate as of 06/17/18  9:18 AM. Always use your most recent med list.          aspirin 81 MG chewable tablet Chew 1 tablet (81 mg total) by mouth daily. Reported on 02/08/2016   gabapentin 300 MG capsule Commonly known as:  NEURONTIN Take 1 capsule (300 mg total) by mouth 3 (three) times daily.   insulin detemir 100 UNIT/ML injection Commonly known as:  LEVEMIR Inject 0.2 mLs (20 Units total) into the skin at bedtime.   metFORMIN 500 MG tablet Commonly known as:  GLUCOPHAGE Take 2 tablets (1094m) with breakfast and 1 tablet (5064m at dinner.   metoprolol succinate 50 MG 24 hr tablet Commonly known as:   TOPROL-XL Take 2 tablets AM and 1 tablet PM   ondansetron 4 MG tablet Commonly known as:  ZOFRAN Take 1 tablet (4 mg total) by mouth every 8 (eight) hours as needed for nausea or vomiting.   potassium chloride 10 MEQ tablet Commonly known as:  K-DUR TAKE ONE TABLET BY MOUTH EVERY DAY   ranitidine 150 MG tablet Commonly known as:  ZANTAC Take 1 tablet (150 mg total) by mouth 2 (two) times daily.   rosuvastatin 20 MG tablet Commonly known as:  CRESTOR Take 20 mg by mouth daily.   sacubitril-valsartan 97-103 MG Commonly known as:  ENTRESTO Take 1 tablet by mouth 2 (two) times daily.   spironolactone 25 MG tablet Commonly known as:  ALDACTONE Take 1 tablet (25 mg total) by mouth daily.   torsemide 20 MG tablet Commonly known as:  DEMADEX Take 1 tablet (20 mg total) by mouth daily.           Objective:   Physical Exam  Constitutional: He is oriented to person, place, and time.  Cardiovascular: Normal rate, regular rhythm and normal heart sounds.  Pulmonary/Chest: Effort normal and breath sounds normal.  Neurological: He is alert and oriented to person, place, and time.  No fluid retention evident in lower extremities.   BP (!) 188/115   Pulse 78   Temp 98.3 F (36.8 C) (Oral)   Ht 6' 1"  (1.854 m)  Wt 248 lb 9.6 oz (112.8 kg)   BMI 32.80 kg/m   BP rechecked in exam room @ 9:26 AM;        Assessment & Plan:   1. Essential hypertension Pt's BP is elevated, pt reported that he has an appointment with [facility] sometime next week. Pt to follow up with [facility] to discuss changes in medication.     Check Today: - CBC - Comprehensive metabolic panel  2. Type 2 diabetes mellitus without complication, without long-term current use of insulin (HCC) Pt reports controlled sugars.   Check Today:  - Hemoglobin A1c - Urinalysis  3. Chronic systolic heart failure ([place]) Pt reported that he has a follow up appointment with the [facility] next  week.   Return in 6 weeks for follow up appointment; Labs today A1C, Met C, CBC, UA

## 2018-06-18 LAB — COMPREHENSIVE METABOLIC PANEL
ALT: 47 IU/L — ABNORMAL HIGH (ref 0–44)
AST: 27 IU/L (ref 0–40)
Albumin/Globulin Ratio: 1.6 (ref 1.2–2.2)
Albumin: 4.7 g/dL (ref 3.5–5.5)
Alkaline Phosphatase: 82 IU/L (ref 39–117)
BUN/Creatinine Ratio: 14 (ref 9–20)
BUN: 12 mg/dL (ref 6–24)
Bilirubin Total: 1.1 mg/dL (ref 0.0–1.2)
CO2: 21 mmol/L (ref 20–29)
Calcium: 9.5 mg/dL (ref 8.7–10.2)
Chloride: 101 mmol/L (ref 96–106)
Creatinine, Ser: 0.87 mg/dL (ref 0.76–1.27)
GFR calc Af Amer: 117 mL/min/{1.73_m2} (ref >59)
GFR calc non Af Amer: 101 mL/min/{1.73_m2} (ref >59)
Globulin, Total: 2.9 g/dL (ref 1.5–4.5)
Glucose: 196 mg/dL — ABNORMAL HIGH (ref 65–99)
Potassium: 4.2 mmol/L (ref 3.5–5.2)
Sodium: 144 mmol/L (ref 134–144)
Total Protein: 7.6 g/dL (ref 6.0–8.5)

## 2018-06-18 LAB — URINALYSIS
Bilirubin, UA: NEGATIVE
Glucose, UA: NEGATIVE
Ketones, UA: NEGATIVE
Leukocytes, UA: NEGATIVE
Nitrite, UA: NEGATIVE
Specific Gravity, UA: 1.009 (ref 1.005–1.030)
Urobilinogen, Ur: 0.2 mg/dL (ref 0.2–1.0)
pH, UA: 5 (ref 5.0–7.5)

## 2018-06-18 LAB — CBC
Hematocrit: 50.1 % (ref 37.5–51.0)
Hemoglobin: 17 g/dL (ref 13.0–17.7)
MCH: 28.8 pg (ref 26.6–33.0)
MCHC: 33.9 g/dL (ref 31.5–35.7)
MCV: 85 fL (ref 79–97)
Platelets: 169 10*3/uL (ref 150–450)
RBC: 5.91 x10E6/uL — ABNORMAL HIGH (ref 4.14–5.80)
RDW: 13.2 % (ref 12.3–15.4)
WBC: 7.2 10*3/uL (ref 3.4–10.8)

## 2018-06-18 LAB — HEMOGLOBIN A1C
Est. average glucose Bld gHb Est-mCnc: 174 mg/dL
Hgb A1c MFr Bld: 7.7 % — ABNORMAL HIGH (ref 4.8–5.6)

## 2018-06-18 IMAGING — US US ABDOMEN COMPLETE
1 series · 14 of 25 positions shown · non-contrast
Comparison: Abdominal ultrasound January 14, 2016

CLINICAL DATA: Intermittent RIGHT upper quadrant pain for 5 years,
worsening for 1 day. History of chronic kidney disease, diabetes.

EXAM:
ABDOMEN ULTRASOUND COMPLETE

[Series 1: us abdomen complete · 0.26mm/px · 14 of 83 slices shown]
[im 1/83]
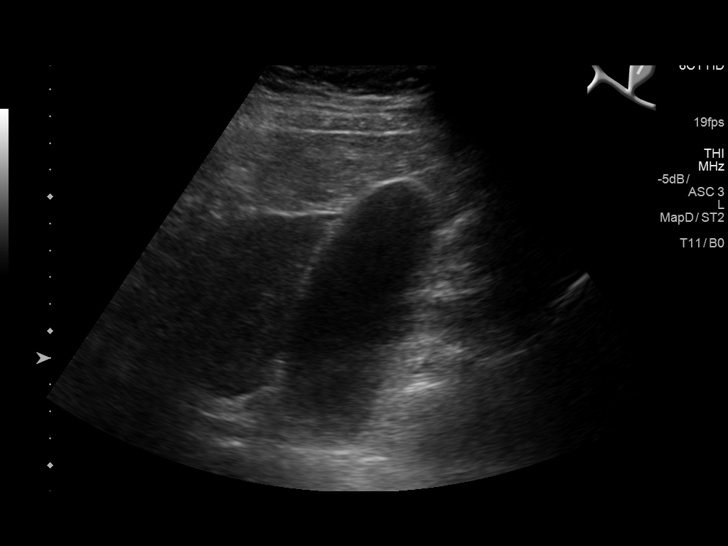
[im 7/83]
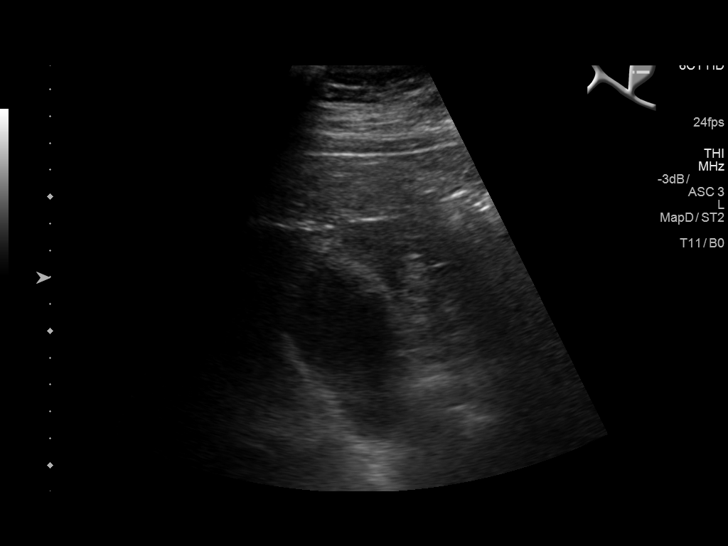
[im 14/83]
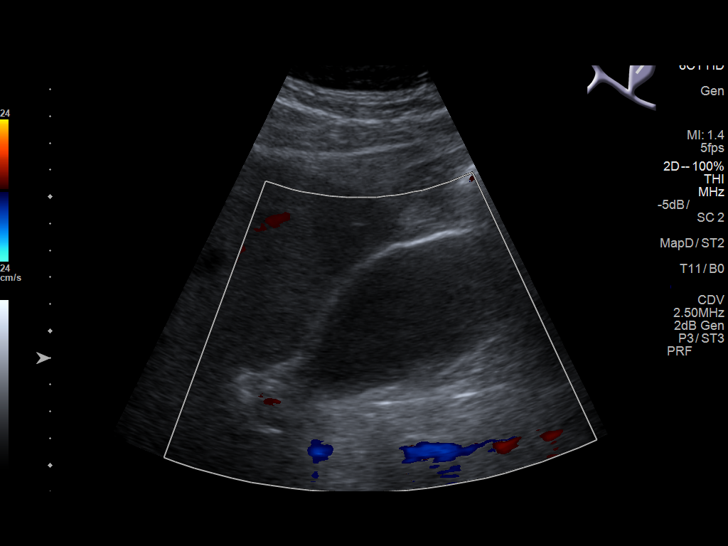
[im 21/83]
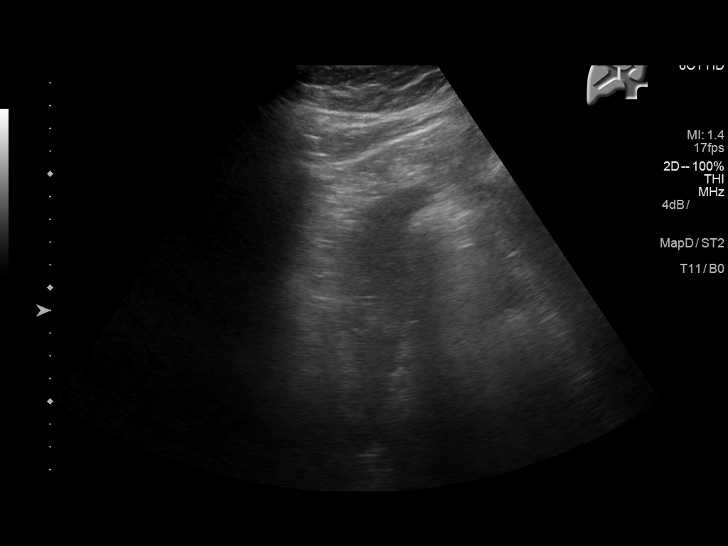
[im 28/83]
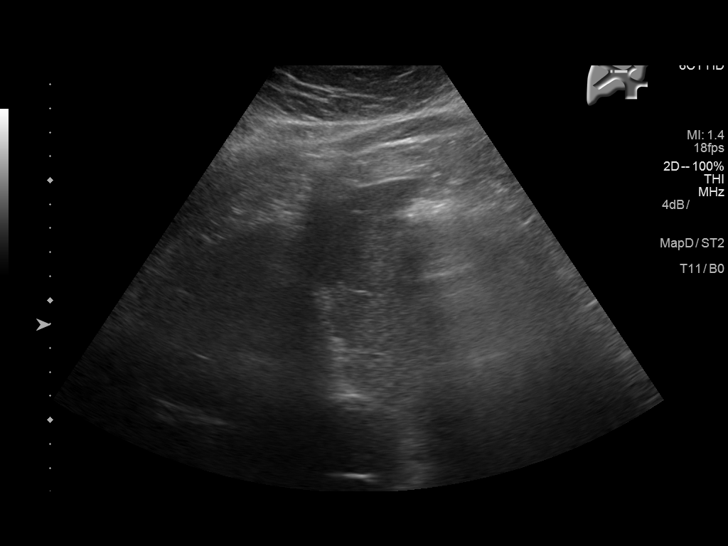
[im 31/83]
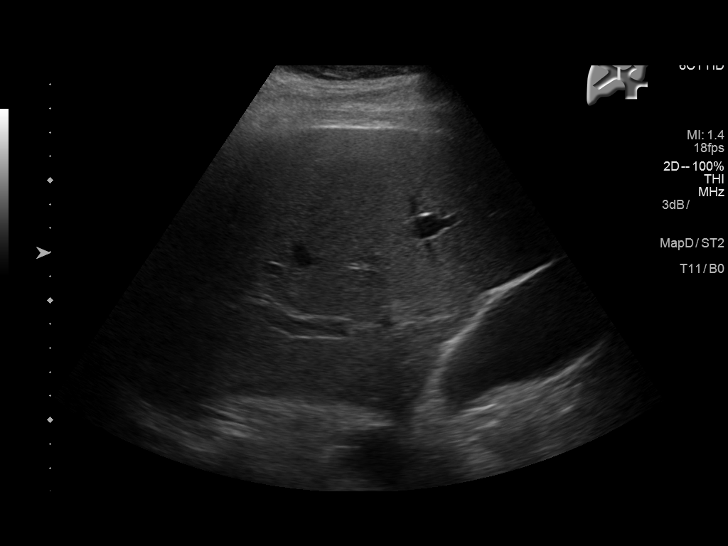
[im 38/83]
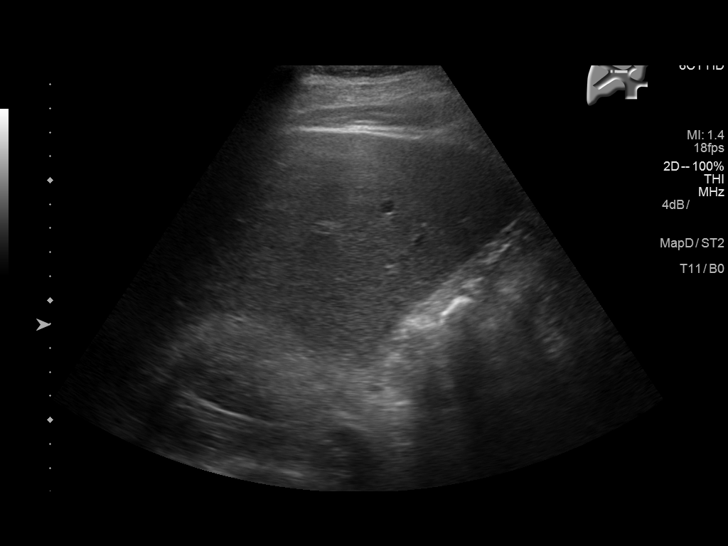
[im 45/83]
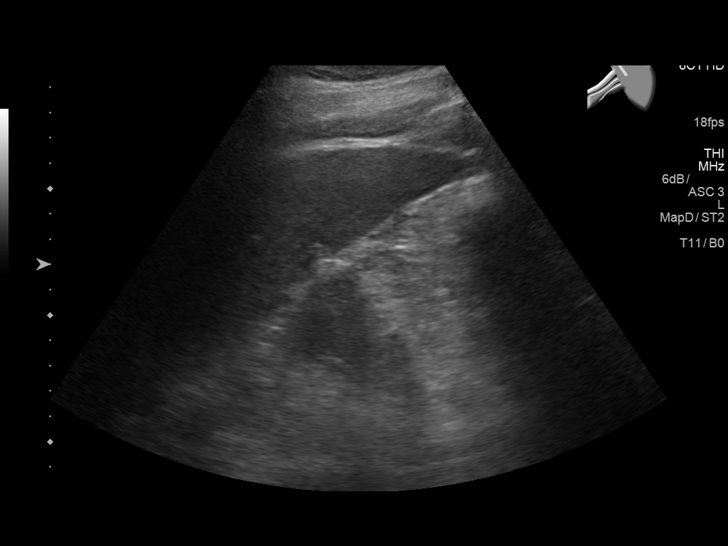
[im 52/83]
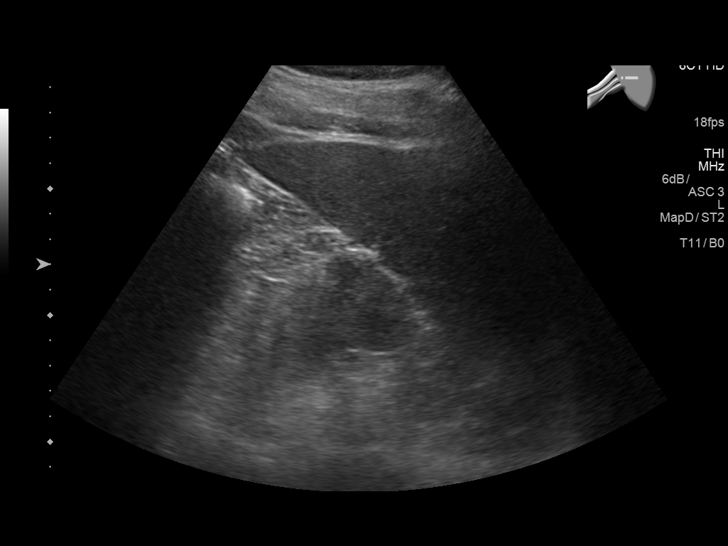
[im 55/83]
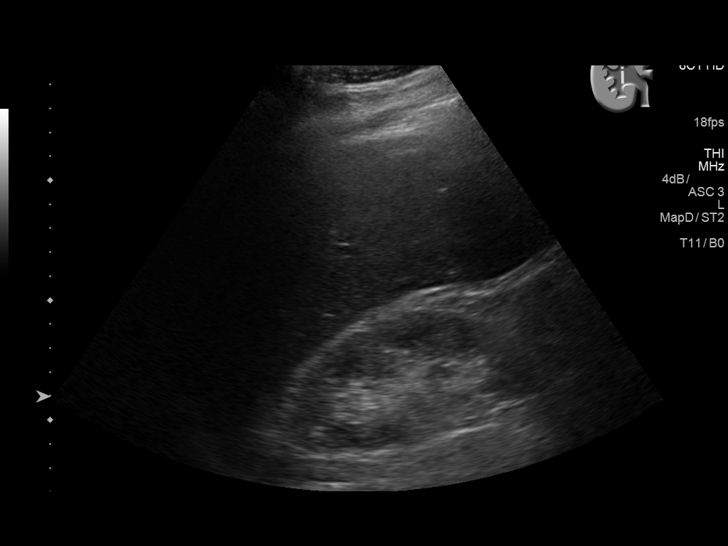
[im 62/83]
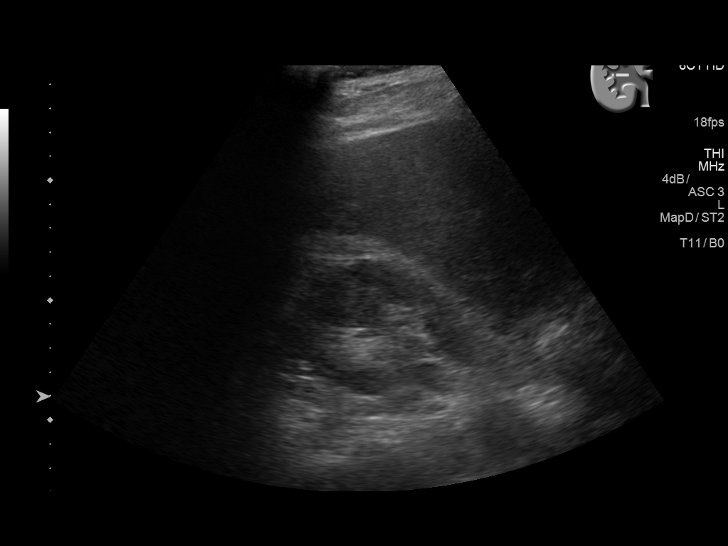
[im 69/83]
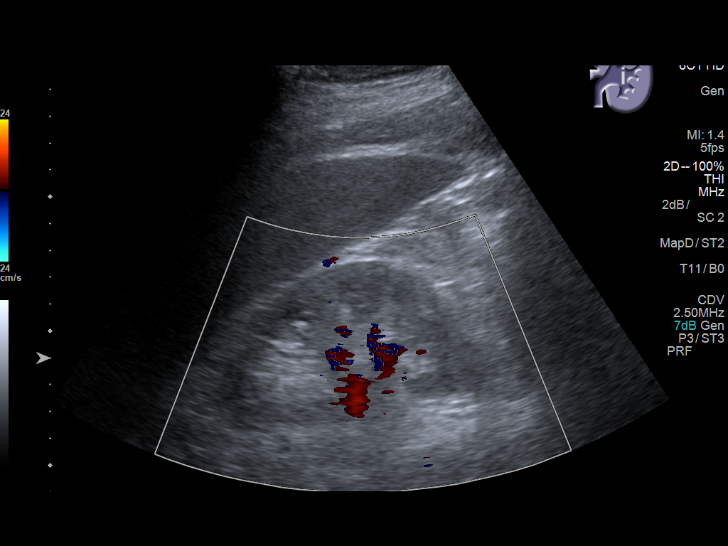
[im 76/83]
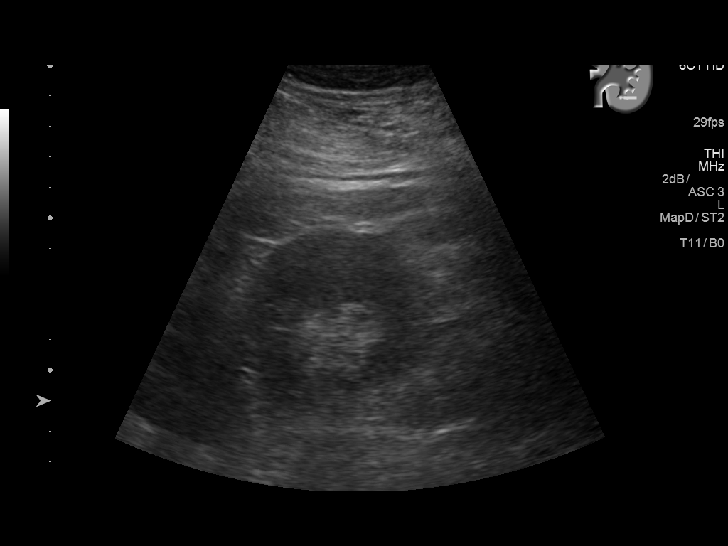
[im 83/83]
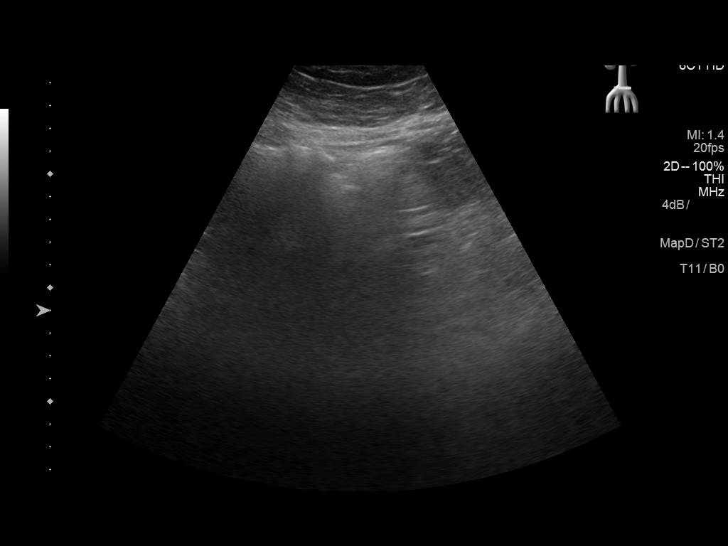

[14 of 25 positions shown; findings below may reference images not displayed]

FINDINGS: Gallbladder: Gallbladder wall remains thickened at 4 mm though,
decreased from prior examination. Minimal echogenic persistent
layering sludge with mild pericholecystic fluid and, sonographic
Murphy's sign was elicited.

Common bile duct: Diameter: 3 mm

Liver: No focal lesion identified. Mildly echogenic compatible with
steatosis.

IVC: No abnormality visualized.

Pancreas: Visualized portion unremarkable.

Spleen: Size and appearance within normal limits.

Right Kidney: Length: 11.3 cm. Echogenicity within normal limits. No
mass or hydronephrosis visualized.

Left Kidney: Length: 10.9 cm. Echogenicity within normal limits. No
mass or hydronephrosis visualized.

Abdominal aorta: No aneurysm visualized. Distal aorta obscured by
bowel gas.

Other findings: None.
IMPRESSION: Sonographic findings of acute cholecystitis.

Hepatic steatosis.

## 2018-06-19 NOTE — Progress Notes (Signed)
Patient ID: Gabriel Dhaliwal., male    DOB: [birth year], 47 y.o.   MRN: [id number]  HPI  Gabriel [name] is a 47 y/o male with a history of HTN, diabetes, COPD, CKD, asthma, current tobacco use and chronic heart failure.   Most recent echo done 04/29/17 showed EF 45-50%. Echo report that was done 11/28/16 and it showed an EF of 40-45% along with mild Gabriel. EF has improved from 30-35% July 2017.  Was in ED 01/09/18 due to diarrhea. Patient was given IV fluids for dehydration.   He presents today for a follow-up visit with a chief complaint of minimal shortness of breath upon moderate exertion. He describes this as chronic in nature having been present for several years. He has associated fatigue, chest pain, palpitations, light-headedness, difficulty sleeping and chronic back pain along with this. He denies any abdominal distention, pedal edema, cough or weight gain. Has been unable to work due to angina that occurs upon exertion.   Past Medical History:  Diagnosis Date  . Chest wall pain, chronic   . Chronic systolic CHF (congestive heart failure) ([place])    a. 03/2015 Echo: EF 45-50%; b. 12/2015 Echo: EF 20-25%; c. 02/2016 Echo: EF 30-35%; d. 11/2016 Echo: EF 40-45%.  . Chronic Troponin Elevation   . CKD (chronic kidney disease), stage III ([place])   . COPD (chronic obstructive pulmonary disease) ([place])   . Diabetes mellitus without complication ([place])   . Hypertension    Resolved since weight loss  . Myocardial infarction ([place])   . NICM (nonischemic cardiomyopathy) ([place])    a. 03/2015 Echo: EF 45-50%; b. 04/2015 MV: small defect of mild severity in apex 2/2 apical thinning, EF 30-44%;  c. 12/2015 Echo: EF 20-25%; d. 02/2016 Echo: EF 30-35%; e. 11/2016 Echo: EF 40-45%, diff HK, mildly dil Ao root, mild Gabriel, mod dil LA;  f. 12/2016 Cath: LM nl, LAD/Diags/LCX/OMs min irregs, RCA 40p/m/d.  . Non-obstructive CAD (coronary artery disease)    a. 04/2015 low risk MV;  b. 12/2016 Cath: minor irregs in LAD/Diag/LCX/OM, RCA  40p/m/d.  [name] (nonsustained ventricular tachycardia) ([place])    a. 12/2015 noted on tele-->amio;  b. 12/2015 Event monitor: no VT. Atach noted.  . Obesity (BMI 30.0-34.9)   . Psoriasis   . Renal insufficiency   . Syncope    a. 01/2016 - felt to be vasovagal.  . Tobacco abuse    Past Surgical History:  Procedure Laterality Date  . AMPUTATION    . CARDIAC CATHETERIZATION    . FINGER AMPUTATION     Traumatic  . FINGER FRACTURE SURGERY Left   . LEFT HEART CATH AND CORONARY ANGIOGRAPHY N/A 01/06/2017   Procedure: Left Heart Cath and Coronary Angiography;  Surgeon: Wellington Hampshire, MD;  Location: [facility];  Service: Cardiovascular;  Laterality: N/A;   Family History  Problem Relation Age of Onset  . Diabetes Mellitus II Mother   . Hypertension Father   . Cancer Paternal Aunt   . Cancer Maternal Grandfather   . Diabetes Maternal Grandfather    Social History   Tobacco Use  . Smoking status: Former Smoker    Packs/day: 1.00    Years: 33.00    Pack years: 33.00    Types: Cigarettes  . Smokeless tobacco: Never Used  . Tobacco comment: Quit a little over a month ago.  Substance Use Topics  . Alcohol use: No    Alcohol/week: 0.0 standard drinks    Frequency:  Never    Comment: occassionally   Allergies  Allergen Reactions  . Prednisone Other (See Comments)    Reaction: Hallucinations     Prior to Admission medications   Medication Sig Start Date End Date Taking? Authorizing Provider  aspirin 81 MG chewable tablet Chew 1 tablet (81 mg total) by mouth daily. Reported on 02/08/2016 06/02/17  Yes [name] A, FNP  insulin detemir (LEVEMIR) 100 UNIT/ML injection Inject 0.2 mLs (20 Units total) into the skin at bedtime. 04/08/18  Yes Tawni Millers, MD  metFORMIN (GLUCOPHAGE) 500 MG tablet Take 2 tablets (1000mg ) with breakfast and 1 tablet (500mg ) at dinner. 04/08/18  Yes Tawni Millers, MD  metoprolol succinate (TOPROL-XL) 50 MG 24 hr tablet Take 2 tablets AM and 1  tablet PM 03/12/18  Yes Hackney, Tina A, FNP  potassium chloride SA (K-DUR,KLOR-CON) 20 MEQ tablet Take 1 tablet (10 mEq total) by mouth daily. 06/22/18  Yes Hackney, Otila Kluver A, FNP  ranitidine (ZANTAC) 150 MG tablet Take 1 tablet (150 mg total) by mouth 2 (two) times daily. 06/04/17  Yes Hackney, Otila Kluver A, FNP  rosuvastatin (CRESTOR) 20 MG tablet Take 20 mg by mouth daily.   Yes [provider]  sacubitril-valsartan (ENTRESTO) 97-103 MG Take 1 tablet by mouth 2 (two) times daily. 01/05/18  Yes Minna Merritts, MD  spironolactone (ALDACTONE) 25 MG tablet Take 1 tablet (25 mg total) by mouth daily. 01/09/18  Yes Schuyler Amor, MD  torsemide (DEMADEX) 20 MG tablet Take 1 tablet (20 mg total) by mouth daily. 06/22/18  Yes Hackney, Otila Kluver A, FNP  gabapentin (NEURONTIN) 300 MG capsule Take 1 capsule (300 mg total) by mouth 3 (three) times daily. Patient not taking: Reported on 06/17/2018 11/16/17 11/16/18  Nance Pear, MD    Review of Systems  Constitutional: Positive for fatigue. Negative for appetite change.  HENT: Positive for congestion. Negative for postnasal drip and sore throat.   Eyes: Negative.   Respiratory: Positive for shortness of breath (at times). Negative for cough and chest tightness.   Cardiovascular: Positive for chest pain ("just the usual") and palpitations. Negative for leg swelling.  Gastrointestinal: Negative for abdominal distention and abdominal pain.  Endocrine: Negative.   Genitourinary: Negative.   Musculoskeletal: Positive for back pain.  Skin: Negative.   Allergic/Immunologic: Negative.   Neurological: Positive for light-headedness (some days), numbness (down both legs at times) and headaches (constant pressure on top of the head). Negative for dizziness.  Hematological: Negative for adenopathy. Does not bruise/bleed easily.  Psychiatric/Behavioral: Positive for sleep disturbance (due to pain down his legs). Negative for dysphoric mood and suicidal ideas. The  patient is not nervous/anxious.    Vitals:   06/22/18 0943  BP: (!) 160/101  Pulse: 67  Resp: 16  Temp: 98.3 F (36.8 C)  TempSrc: Oral  SpO2: 98%  Weight: 245 lb (111.1 kg)  Height: 6\' 1"  (1.854 m)   Wt Readings from Last 3 Encounters:  06/22/18 245 lb (111.1 kg)  06/17/18 248 lb 9.6 oz (112.8 kg)  04/08/18 252 lb 4.8 oz (114.4 kg)   Lab Results  Component Value Date   CREATININE 0.87 06/17/2018   CREATININE 0.92 04/01/2018   CREATININE 1.45 (H) 01/20/2018   Physical Exam  Constitutional: He is oriented to person, place, and time. He appears well-developed and well-nourished.  HENT:  Head: Normocephalic and atraumatic.  Neck: Normal range of motion. Neck supple. No JVD present.  Cardiovascular: Normal rate and regular rhythm.  Pulmonary/Chest: Effort  normal. He has no wheezes. He has no rales. He exhibits no tenderness.  Abdominal: Soft. He exhibits no distension. There is no tenderness.  Musculoskeletal: He exhibits tenderness. He exhibits no edema.       Lumbar back: He exhibits decreased range of motion, tenderness and pain. He exhibits no swelling.  Neurological: He is alert and oriented to person, place, and time.  Skin: Skin is warm and dry.  Psychiatric: He has a normal mood and affect. His behavior is normal. Thought content normal.  Nursing note and vitals reviewed.   Assessment & Plan:  1: Chronic heart failure with reduced ejection fraction- - NYHA class II - euvolemic today - weigh daily and call for an overnight weight gain of >2 pounds or a weekly weight gain of >5 pounds.  - weight down 4 pounds from last visit here 5 months ago - not adding salt and he was encouraged to closely follow a 2000mg  sodium diet - has not received his flu vaccine yet for this season - saw cardiologist Rockey Situ) 01/05/18 - BNP from 04/28/17 was 25.0 - patient develops angina upon exertion and, therefore, is unable to work  2: HTN- - BP elevated today - will increase his  torsemide to 40mg  daily along with increasing his potassium to 35meq daily - saw PCP @ [facility] 06/17/18 & returns December; will ask them to check lab work since adjusting diuretic/potassium  - BMP from 06/17/18 reviewed and showed sodium 144, potassium 4.2, creatinine 0.87 and GFR 101  3: Back pain- - this started many months ago and has been chronic ever since. 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  - patient says that his PCP is supposed to be ordering MRI  4: Diabetes-   - glucose in clinic today was 221; ate crackers last night -  A1c 7.7 % on 06/17/18 - says glucose at home has been running 130's-140's - followed by [facility] - completed some classes at the [facility]  Patient did not bring his medications nor a list. Each medication was verbally reviewed with the patient and he was encouraged to bring the bottles to every visit to confirm accuracy of list.  Return in 2 months or sooner for any questions/problems before then.

## 2018-06-22 ENCOUNTER — Encounter: Payer: Self-pay | Admitting: Family

## 2018-06-22 ENCOUNTER — Ambulatory Visit: Payer: Medicaid Other | Attending: Family | Admitting: Family

## 2018-06-22 ENCOUNTER — Other Ambulatory Visit: Payer: Self-pay

## 2018-06-22 VITALS — BP 160/101 | HR 67 | Temp 98.3°F | Resp 16 | Ht 73.0 in | Wt 245.0 lb

## 2018-06-22 DIAGNOSIS — J449 Chronic obstructive pulmonary disease, unspecified: Secondary | ICD-10-CM | POA: Insufficient documentation

## 2018-06-22 DIAGNOSIS — I252 Old myocardial infarction: Secondary | ICD-10-CM | POA: Insufficient documentation

## 2018-06-22 DIAGNOSIS — Z7982 Long term (current) use of aspirin: Secondary | ICD-10-CM | POA: Insufficient documentation

## 2018-06-22 DIAGNOSIS — Z79899 Other long term (current) drug therapy: Secondary | ICD-10-CM | POA: Insufficient documentation

## 2018-06-22 DIAGNOSIS — E119 Type 2 diabetes mellitus without complications: Secondary | ICD-10-CM

## 2018-06-22 DIAGNOSIS — Z8249 Family history of ischemic heart disease and other diseases of the circulatory system: Secondary | ICD-10-CM | POA: Insufficient documentation

## 2018-06-22 DIAGNOSIS — I130 Hypertensive heart and chronic kidney disease with heart failure and stage 1 through stage 4 chronic kidney disease, or unspecified chronic kidney disease: Secondary | ICD-10-CM | POA: Insufficient documentation

## 2018-06-22 DIAGNOSIS — Z87891 Personal history of nicotine dependence: Secondary | ICD-10-CM | POA: Insufficient documentation

## 2018-06-22 DIAGNOSIS — E669 Obesity, unspecified: Secondary | ICD-10-CM | POA: Insufficient documentation

## 2018-06-22 DIAGNOSIS — Z794 Long term (current) use of insulin: Secondary | ICD-10-CM | POA: Insufficient documentation

## 2018-06-22 DIAGNOSIS — Z888 Allergy status to other drugs, medicaments and biological substances status: Secondary | ICD-10-CM | POA: Insufficient documentation

## 2018-06-22 DIAGNOSIS — I5022 Chronic systolic (congestive) heart failure: Secondary | ICD-10-CM | POA: Insufficient documentation

## 2018-06-22 DIAGNOSIS — I10 Essential (primary) hypertension: Secondary | ICD-10-CM

## 2018-06-22 DIAGNOSIS — Z833 Family history of diabetes mellitus: Secondary | ICD-10-CM | POA: Insufficient documentation

## 2018-06-22 DIAGNOSIS — R202 Paresthesia of skin: Secondary | ICD-10-CM | POA: Insufficient documentation

## 2018-06-22 DIAGNOSIS — L409 Psoriasis, unspecified: Secondary | ICD-10-CM | POA: Insufficient documentation

## 2018-06-22 DIAGNOSIS — G8929 Other chronic pain: Secondary | ICD-10-CM

## 2018-06-22 DIAGNOSIS — M5441 Lumbago with sciatica, right side: Secondary | ICD-10-CM

## 2018-06-22 DIAGNOSIS — N183 Chronic kidney disease, stage 3 (moderate): Secondary | ICD-10-CM | POA: Insufficient documentation

## 2018-06-22 DIAGNOSIS — R200 Anesthesia of skin: Secondary | ICD-10-CM | POA: Insufficient documentation

## 2018-06-22 DIAGNOSIS — M545 Low back pain: Secondary | ICD-10-CM | POA: Insufficient documentation

## 2018-06-22 DIAGNOSIS — E1122 Type 2 diabetes mellitus with diabetic chronic kidney disease: Secondary | ICD-10-CM | POA: Insufficient documentation

## 2018-06-22 DIAGNOSIS — I2510 Atherosclerotic heart disease of native coronary artery without angina pectoris: Secondary | ICD-10-CM | POA: Insufficient documentation

## 2018-06-22 DIAGNOSIS — Z6832 Body mass index (BMI) 32.0-32.9, adult: Secondary | ICD-10-CM | POA: Insufficient documentation

## 2018-06-22 DIAGNOSIS — M5442 Lumbago with sciatica, left side: Secondary | ICD-10-CM

## 2018-06-22 LAB — GLUCOSE, CAPILLARY: Glucose-Capillary: 221 mg/dL — ABNORMAL HIGH (ref 70–99)

## 2018-06-22 MED ORDER — TORSEMIDE 20 MG PO TABS: 40.0000 mg | ORAL_TABLET | Freq: Every day | ORAL | 3 refills | Status: DC

## 2018-06-22 MED ORDER — POTASSIUM CHLORIDE CRYS ER 20 MEQ PO TBCR: 20.0000 meq | EXTENDED_RELEASE_TABLET | Freq: Every day | ORAL | 3 refills | Status: DC

## 2018-06-22 NOTE — Patient Instructions (Signed)
Continue weighing daily and call for an overnight weight gain of > 2 pounds or a weekly weight gain of >5 pounds. 

## 2018-06-25 IMAGING — US US ABDOMEN LIMITED
1 series · 14 of 25 positions shown · non-contrast
Comparison: Ultrasound 11/29/2016

CLINICAL DATA: RIGHT upper quadrant pain.

EXAM:
US ABDOMEN LIMITED - RIGHT UPPER QUADRANT

[Series 1: us abdomen limited · 0.25mm/px · 14 of 42 slices shown]
[im 1/42]
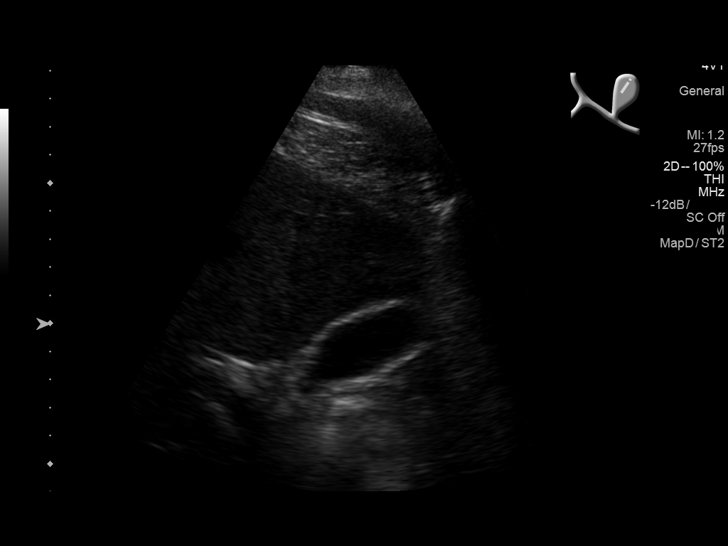
[im 4/42]
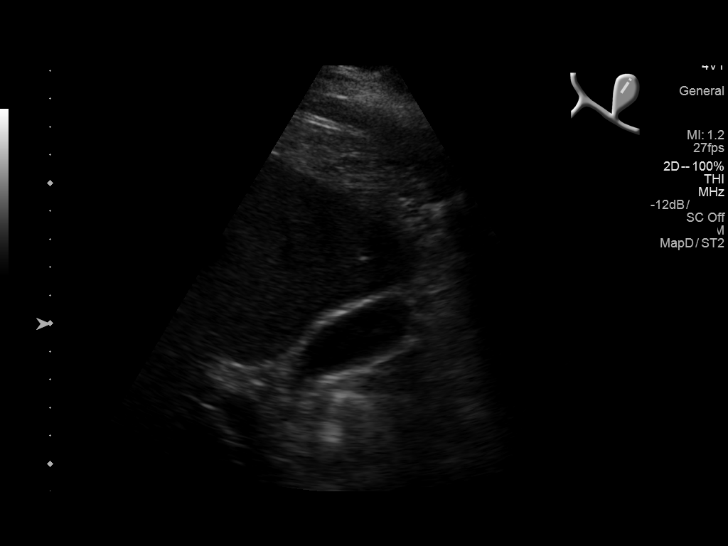
[im 7/42]
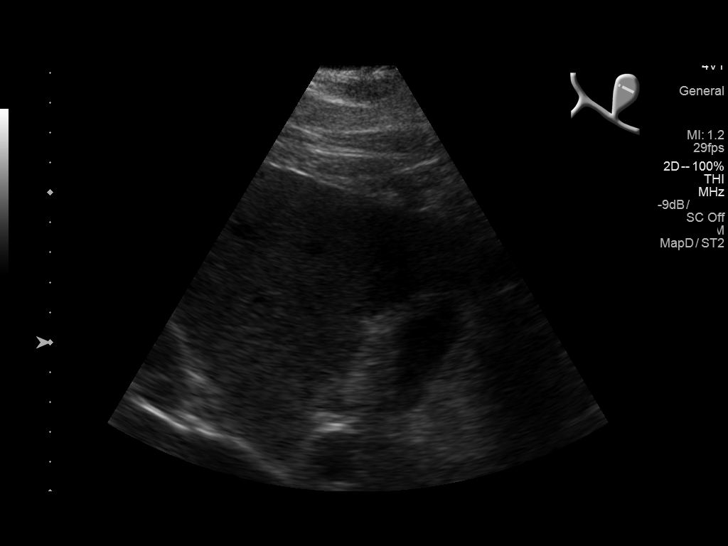
[im 11/42]
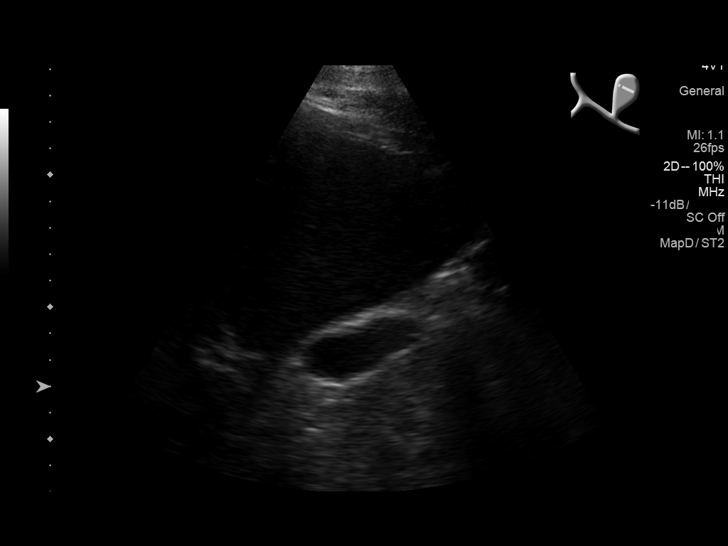
[im 14/42]
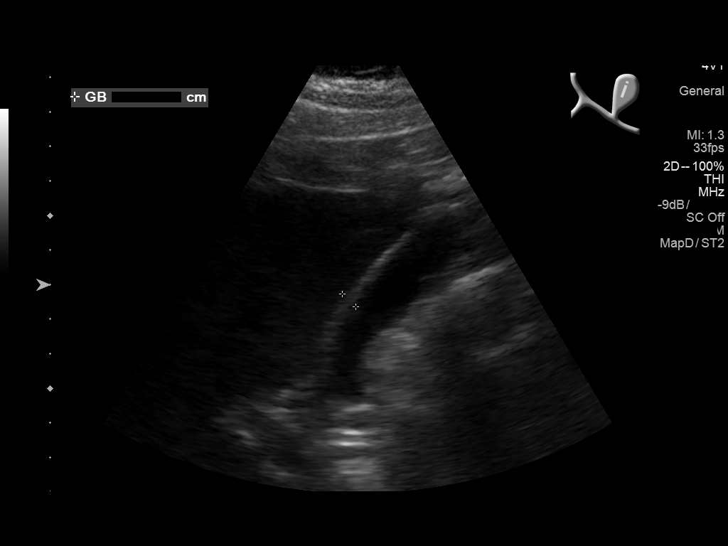
[im 16/42]
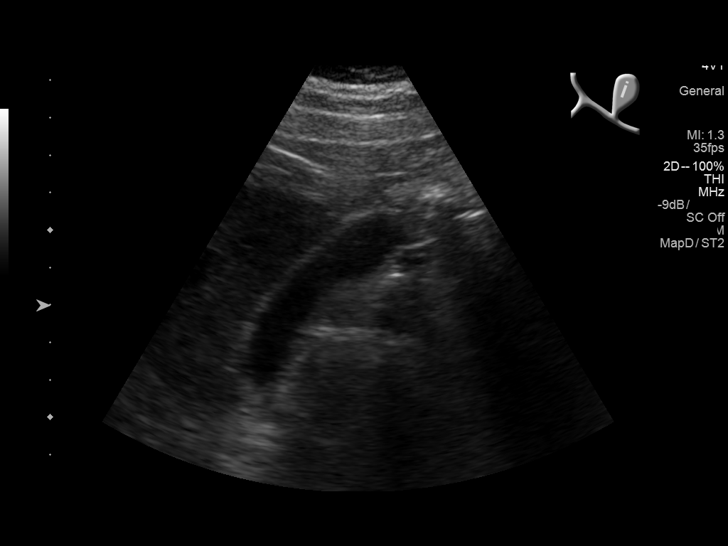
[im 19/42]
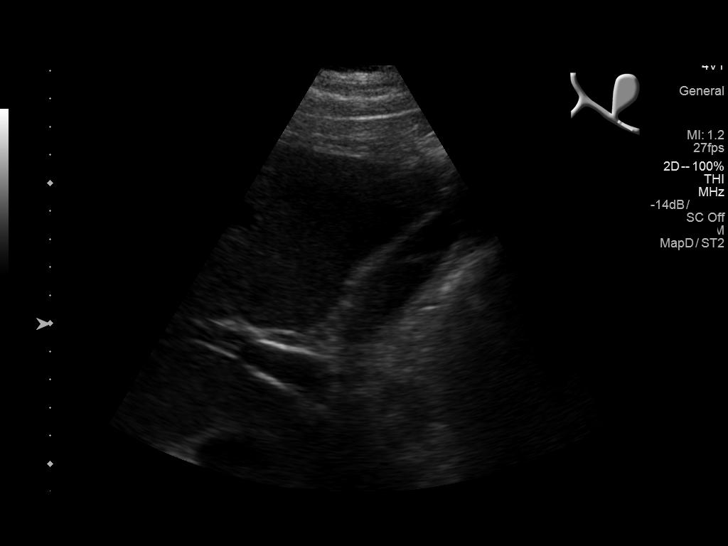
[im 23/42]
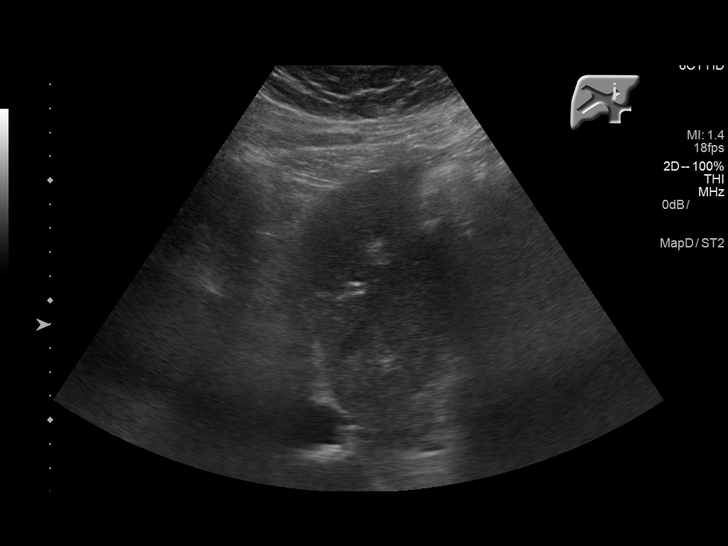
[im 26/42]
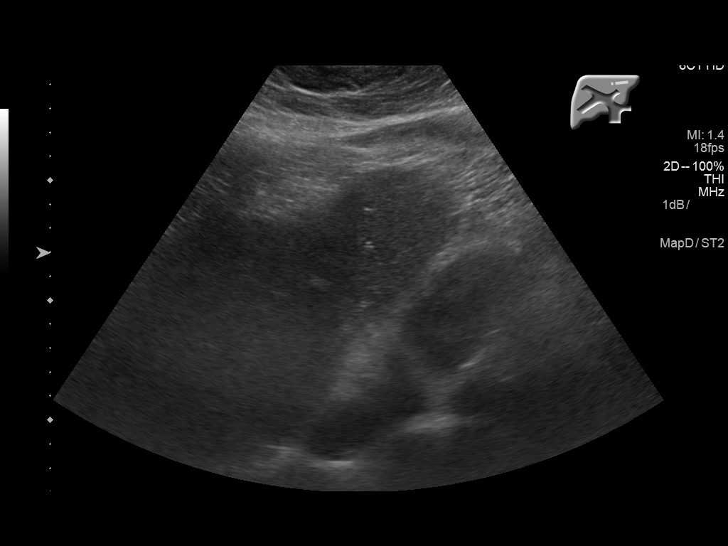
[im 28/42]
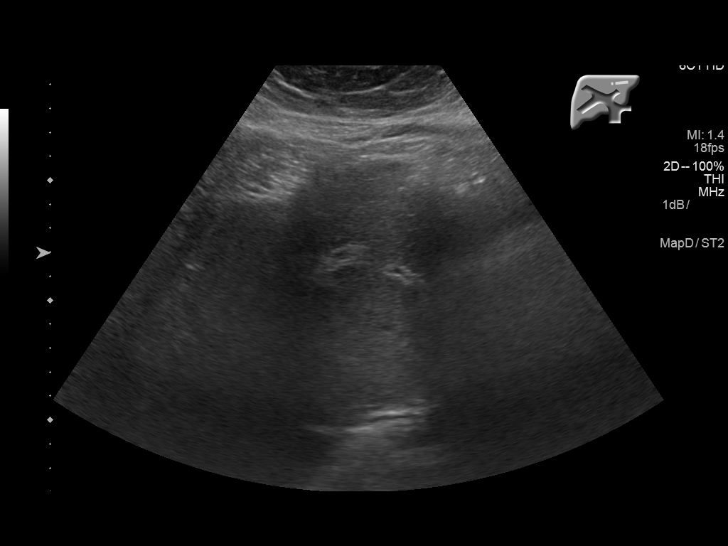
[im 31/42]
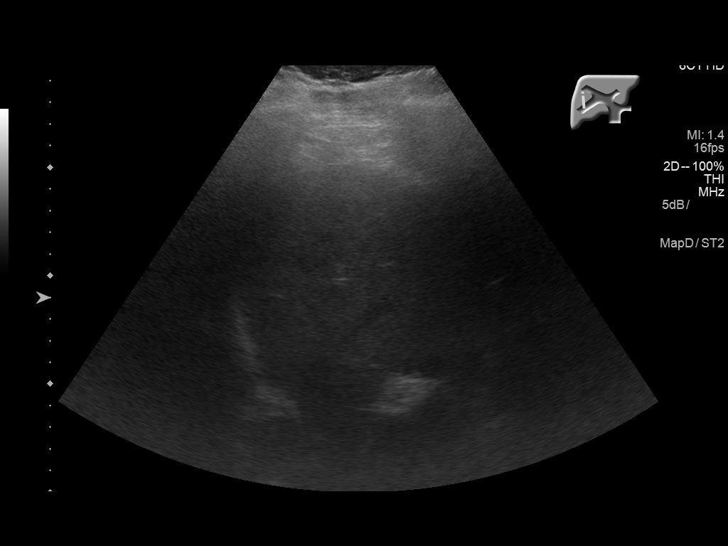
[im 35/42]
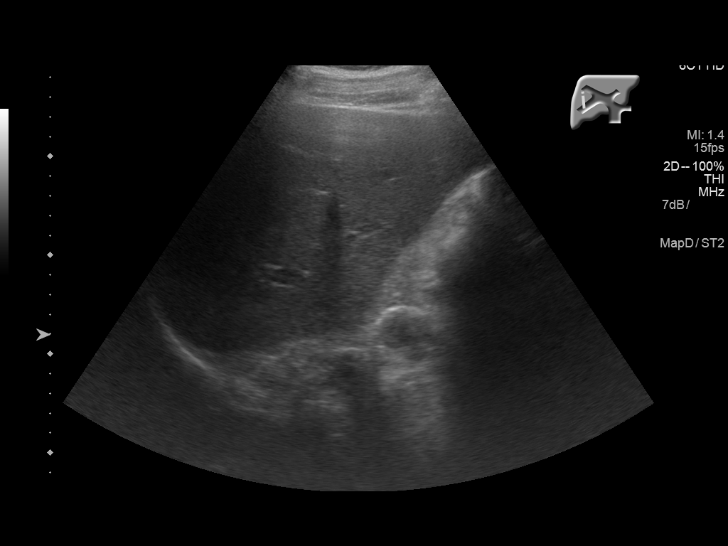
[im 38/42]
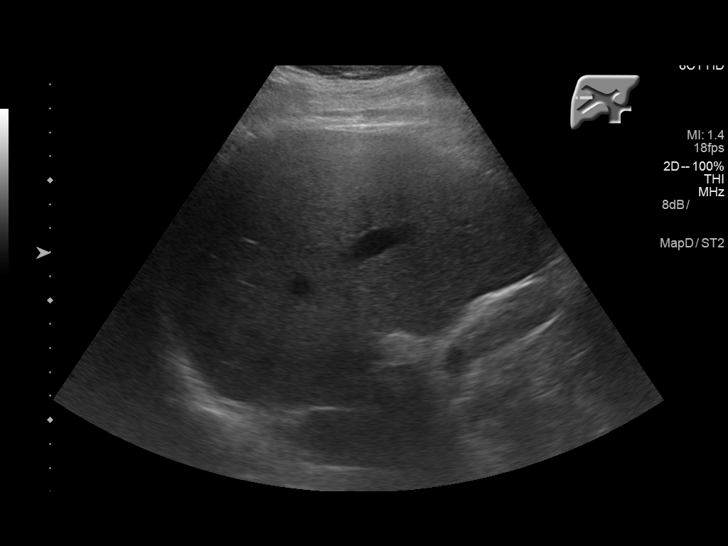
[im 42/42]
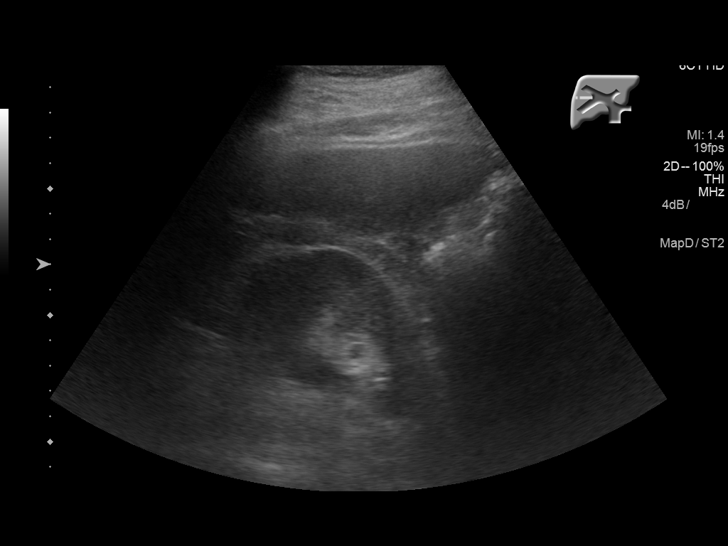

[14 of 25 positions shown; findings below may reference images not displayed]

FINDINGS: Gallbladder:

Mildly contracted. No gallstones. Negative sonographic Murphy's
sign.

Common bile duct:

Diameter: Normal at 3 mm

Liver:

No focal lesion identified. Within normal limits in parenchymal
echogenicity.
IMPRESSION: 1. Contracted gallbladder.
2. No evidence cholecystitis.

## 2018-06-29 IMAGING — CT CT ANGIO CHEST
2 of 6 series · 18 of 46 positions shown · IV contrast (APPLIED)
Comparison: 10/28/2016

CLINICAL DATA: past history of diabetes type 2 which improved with
weight loss and smoking. Patient reports increasing shortness of
breath with exertion for some weeks and then chest pain coming and
going for the last 2 days. It began again at [DATE] this morning with
shortness of breath and nausea and has continued. It feels like an
elephant sitting on his chest. Hx of CHF,

EXAM:
CT ANGIOGRAPHY CHEST WITH CONTRAST
TECHNIQUE: Multidetector CT imaging of the chest was performed using the
standard protocol during bolus administration of intravenous
contrast. Multiplanar CT image reconstructions and MIPs were
obtained to evaluate the vascular anatomy.
CONTRAST:  75 mL Isovue 370 IV

[Series 5: thins · axial · 0.87mm/px · z∈[-8,+258]mm · 16 of 292 slices shown]
[im 13/292  lung]
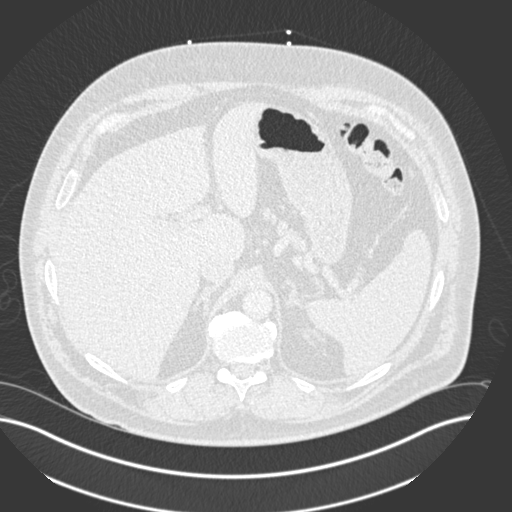
[im 38/292  soft-tissue]
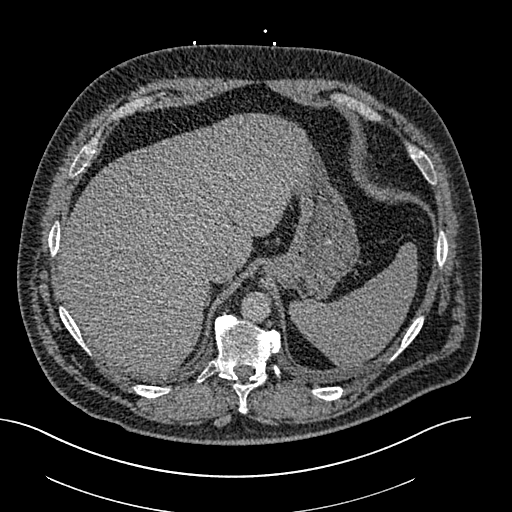
[im 51/292  lung]
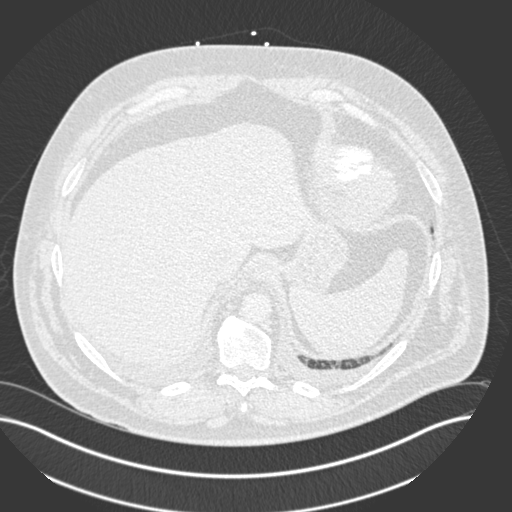
[im 64/292  soft-tissue]
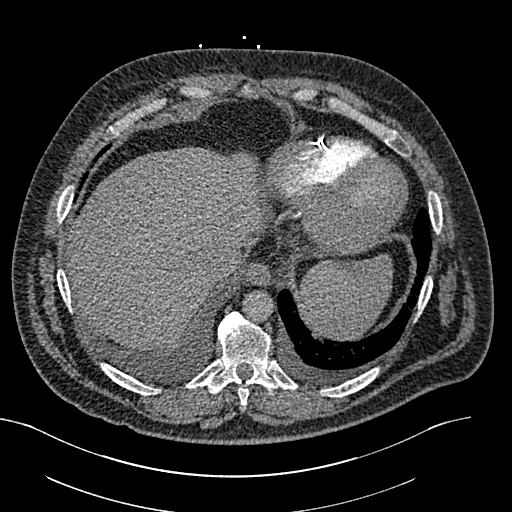
[im 89/292  lung]
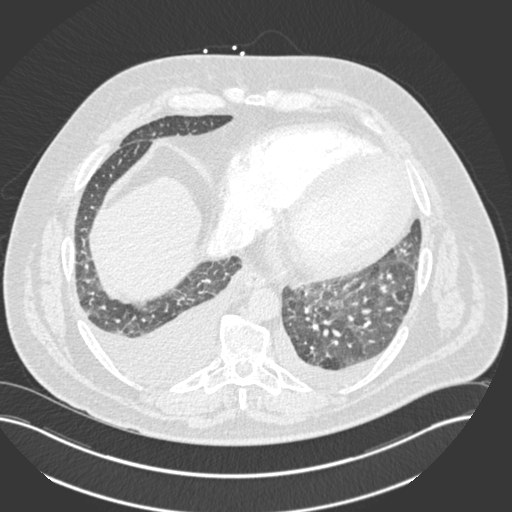
[im 102/292  soft-tissue]
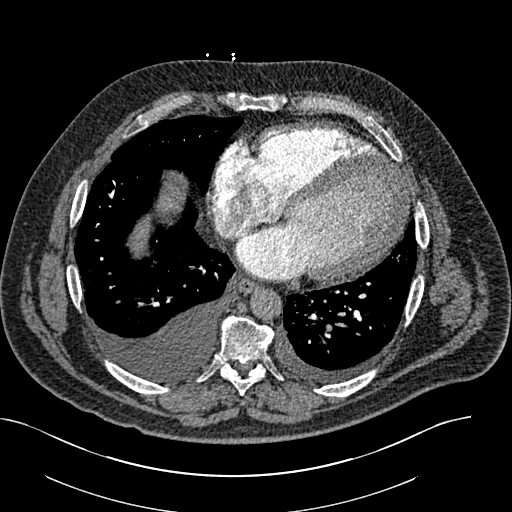
[im 114/292  lung]
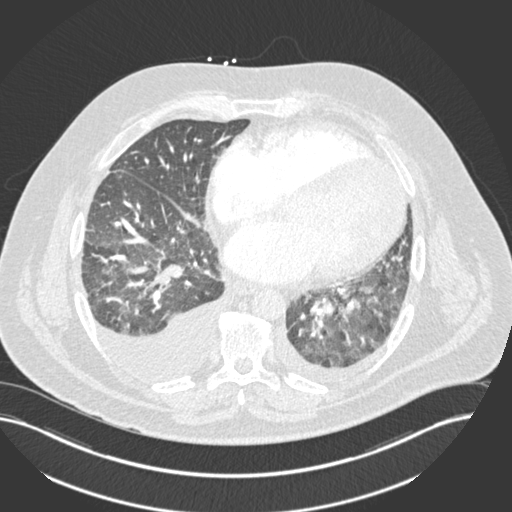
[im 140/292  soft-tissue]
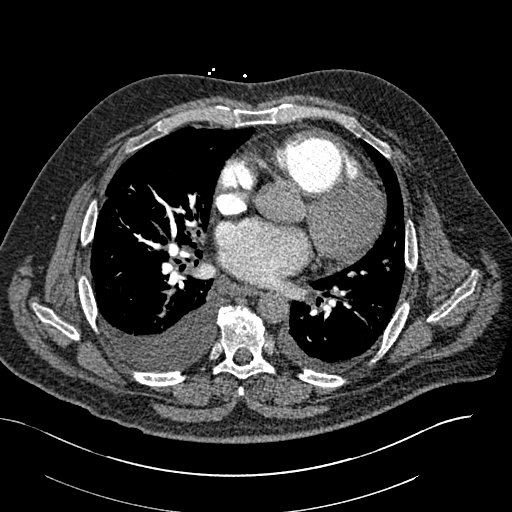
[im 152/292  lung]
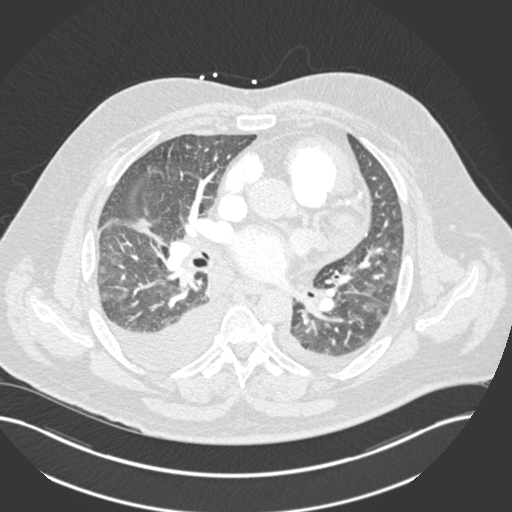
[im 178/292  soft-tissue]
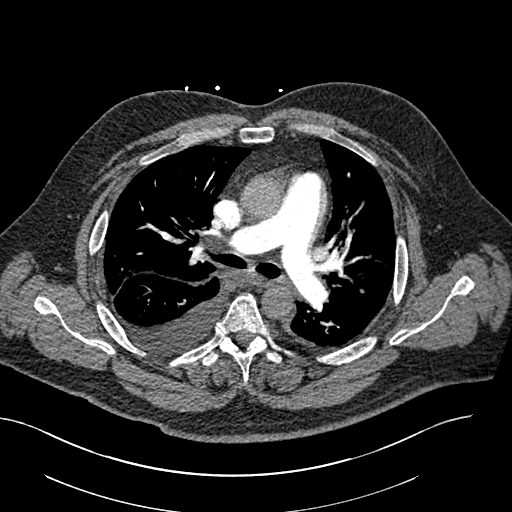
[im 190/292  lung]
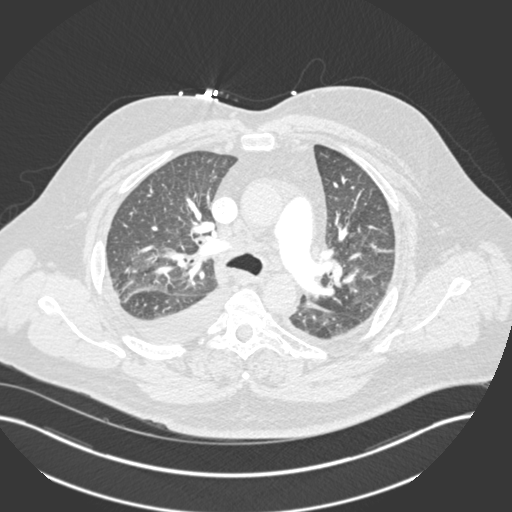
[im 203/292  soft-tissue]
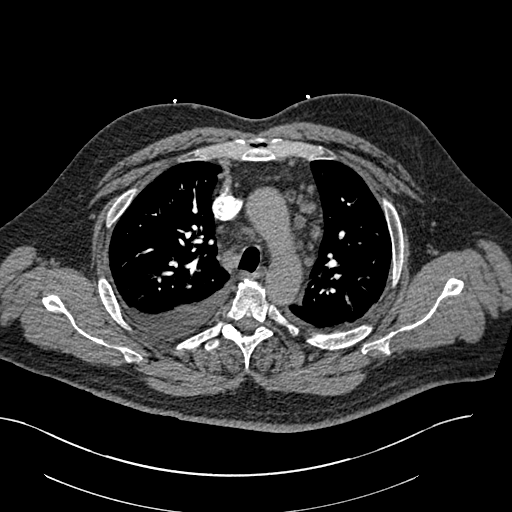
[im 228/292  lung]
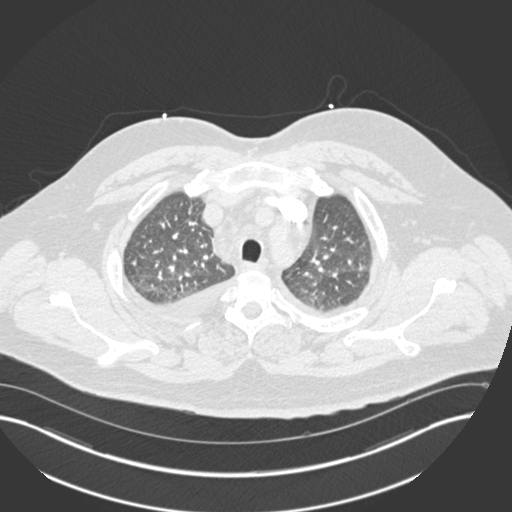
[im 241/292  soft-tissue]
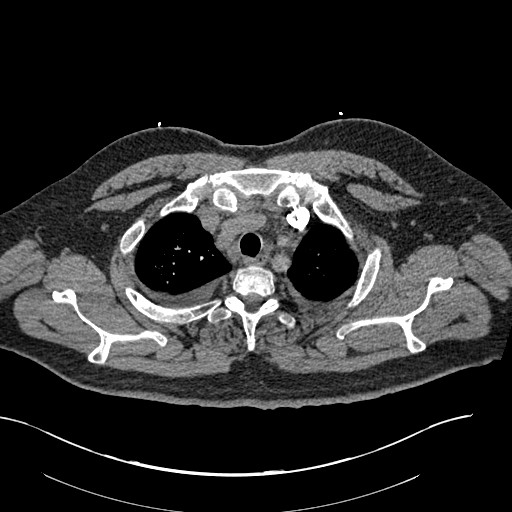
[im 254/292  lung]
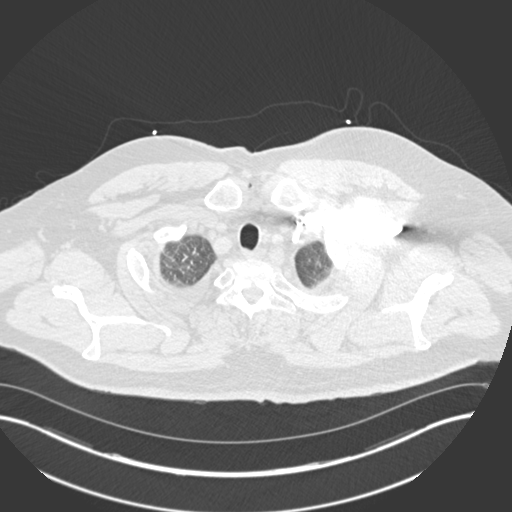
[im 279/292  soft-tissue]
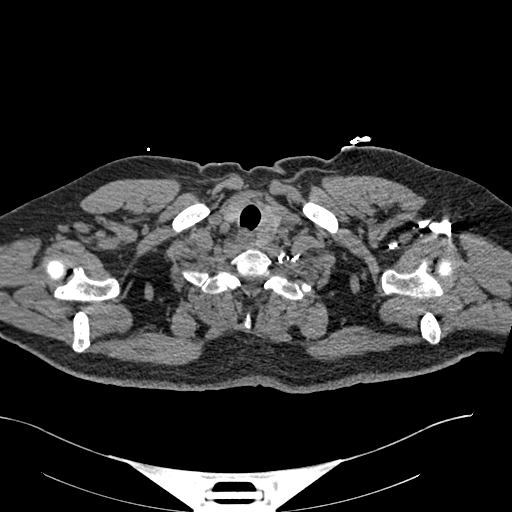

[Series 7: coronal mpr · coronal · 0.57mm/px · 2 of 107 slices shown]
[im 36/107  soft-tissue]
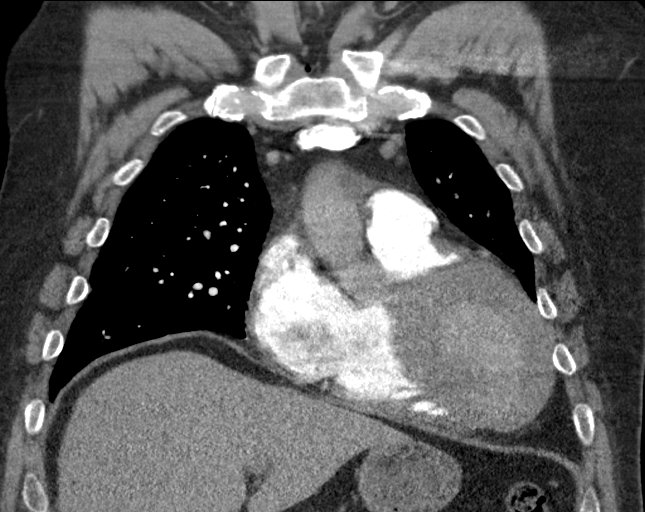
[im 71/107  soft-tissue]
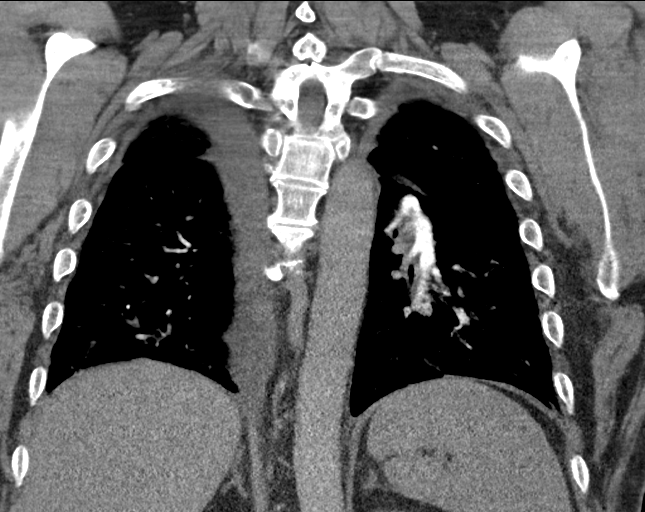

[18 of 46 positions shown; findings below may reference images not displayed]

FINDINGS: Cardiovascular: Satisfactory opacification of pulmonary arteries
noted, and there is no evidence of pulmonary emboli. Incomplete
opacification of the thoracic aorta. No evidence of aneurysm.
Scattered coronary calcifications. No pericardial effusion.

Mediastinum/Nodes: 2.2 cm subcarinal adenopathy previously 2 cm.
Multiple prominent paraesophageal, prevascular, right paratracheal,
precarinal, and AP window lymph nodes which are largely stable. No
axillary adenopathy.

Lungs/Pleura: Pleural effusions right greater than left, slightly
increased. No pneumothorax. Low lung volumes. Small calcified
granulomas in both lower lobes.

Upper Abdomen: No acute findings

Musculoskeletal: No fracture or other worrisome lesions.

Review of the MIP images confirms the above findings.
IMPRESSION: 1. Negative for acute PE.
2. Pleural effusions right greater than left slightly increased
since prior study.
3. Chronic mediastinal adenopathy, largely stable.
4. Atherosclerosis, including coronary artery disease. Please note
that although the presence of coronary artery calcium documents the
presence of coronary artery disease, the severity of this disease
and any potential stenosis cannot be assessed on this non-gated CT
examination. Assessment for potential risk factor modification,
dietary therapy or pharmacologic therapy may be warranted, if
clinically indicated.

## 2018-07-15 ENCOUNTER — Other Ambulatory Visit: Payer: Medicaid Other

## 2018-07-22 ENCOUNTER — Ambulatory Visit: Payer: Medicaid Other | Admitting: Internal Medicine

## 2018-07-22 IMAGING — CR DG CHEST 2V
2 series · 2 of 2 positions shown · non-contrast
Comparison: 01/05/2017

CLINICAL DATA: Shortness of breath with chest pain

EXAM:
CHEST  2 VIEW

[chest pa]
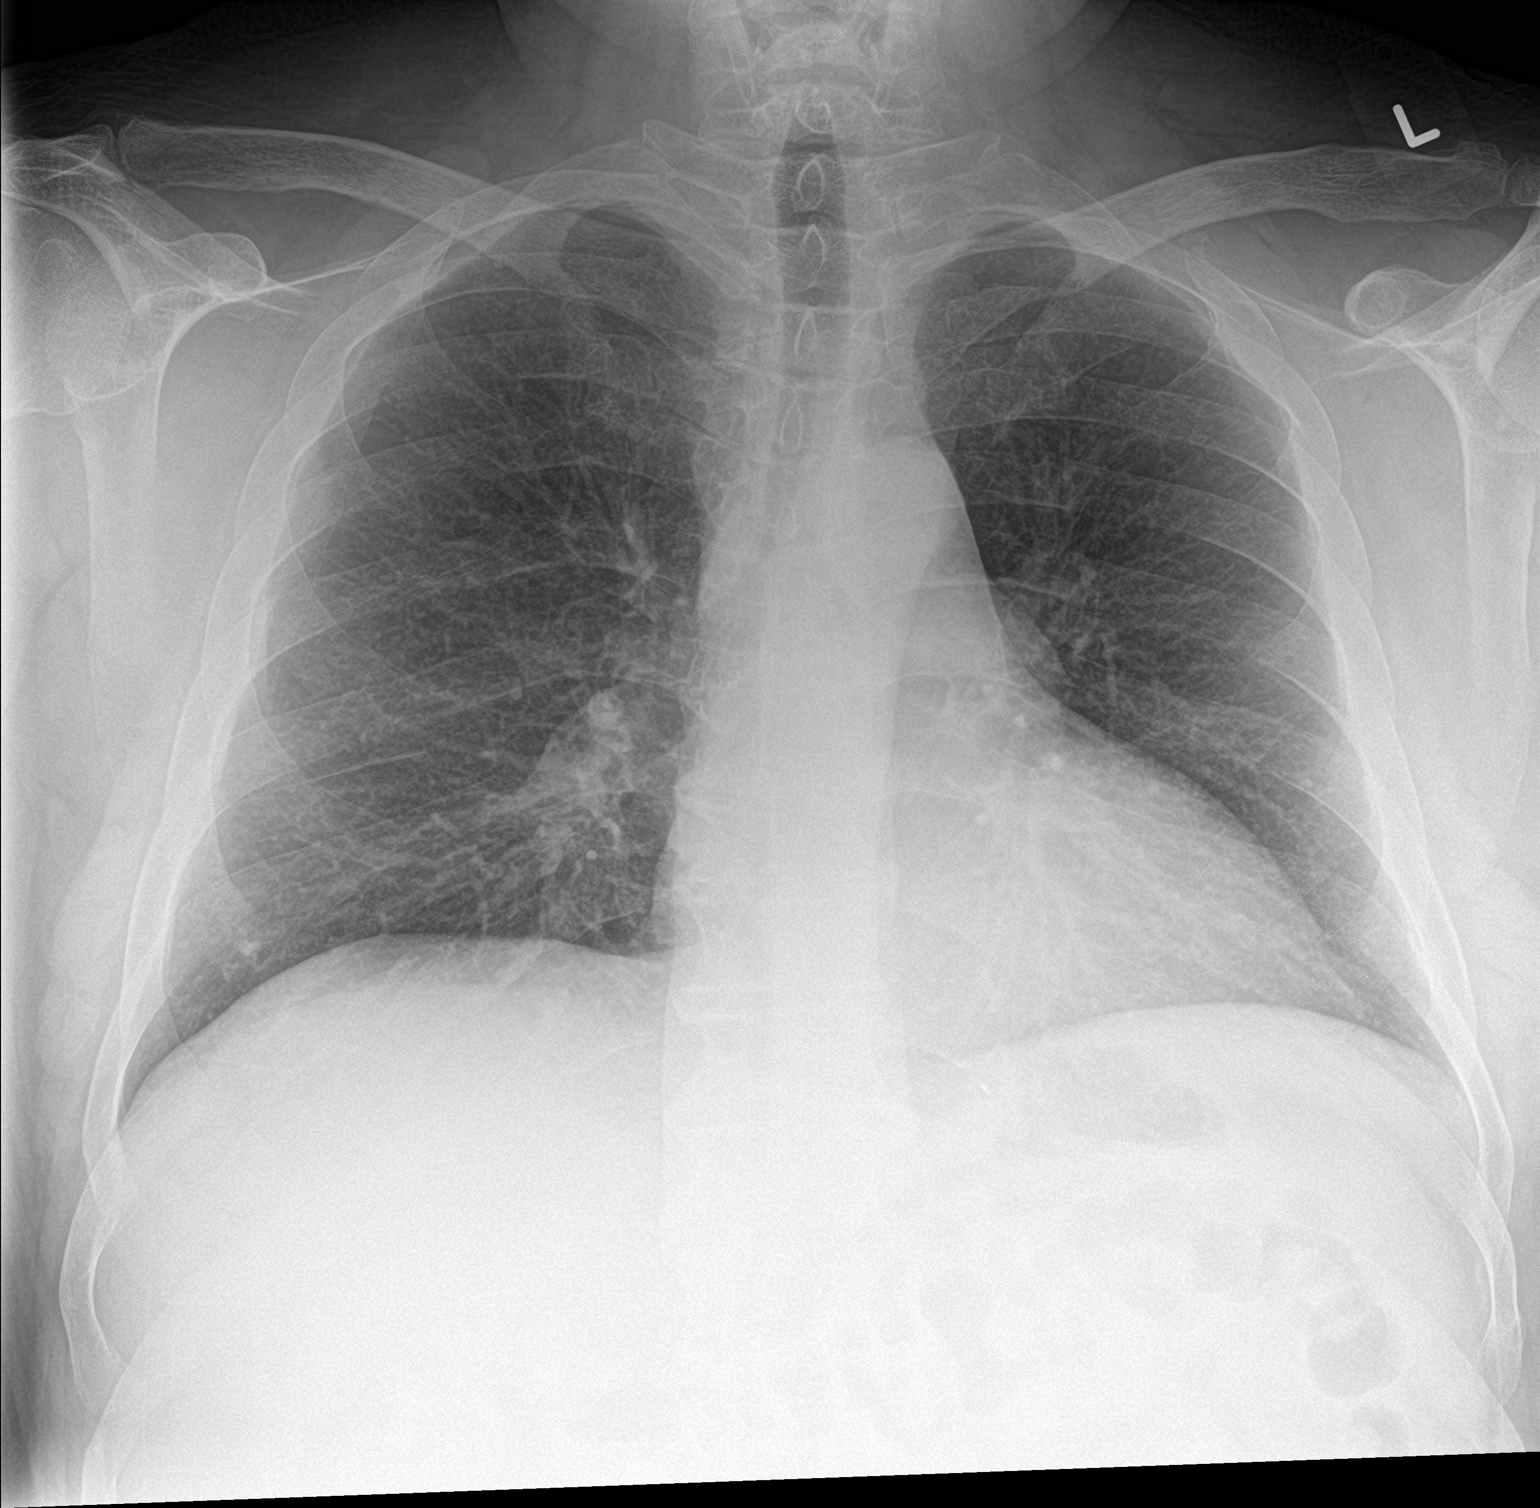

[chest lat]
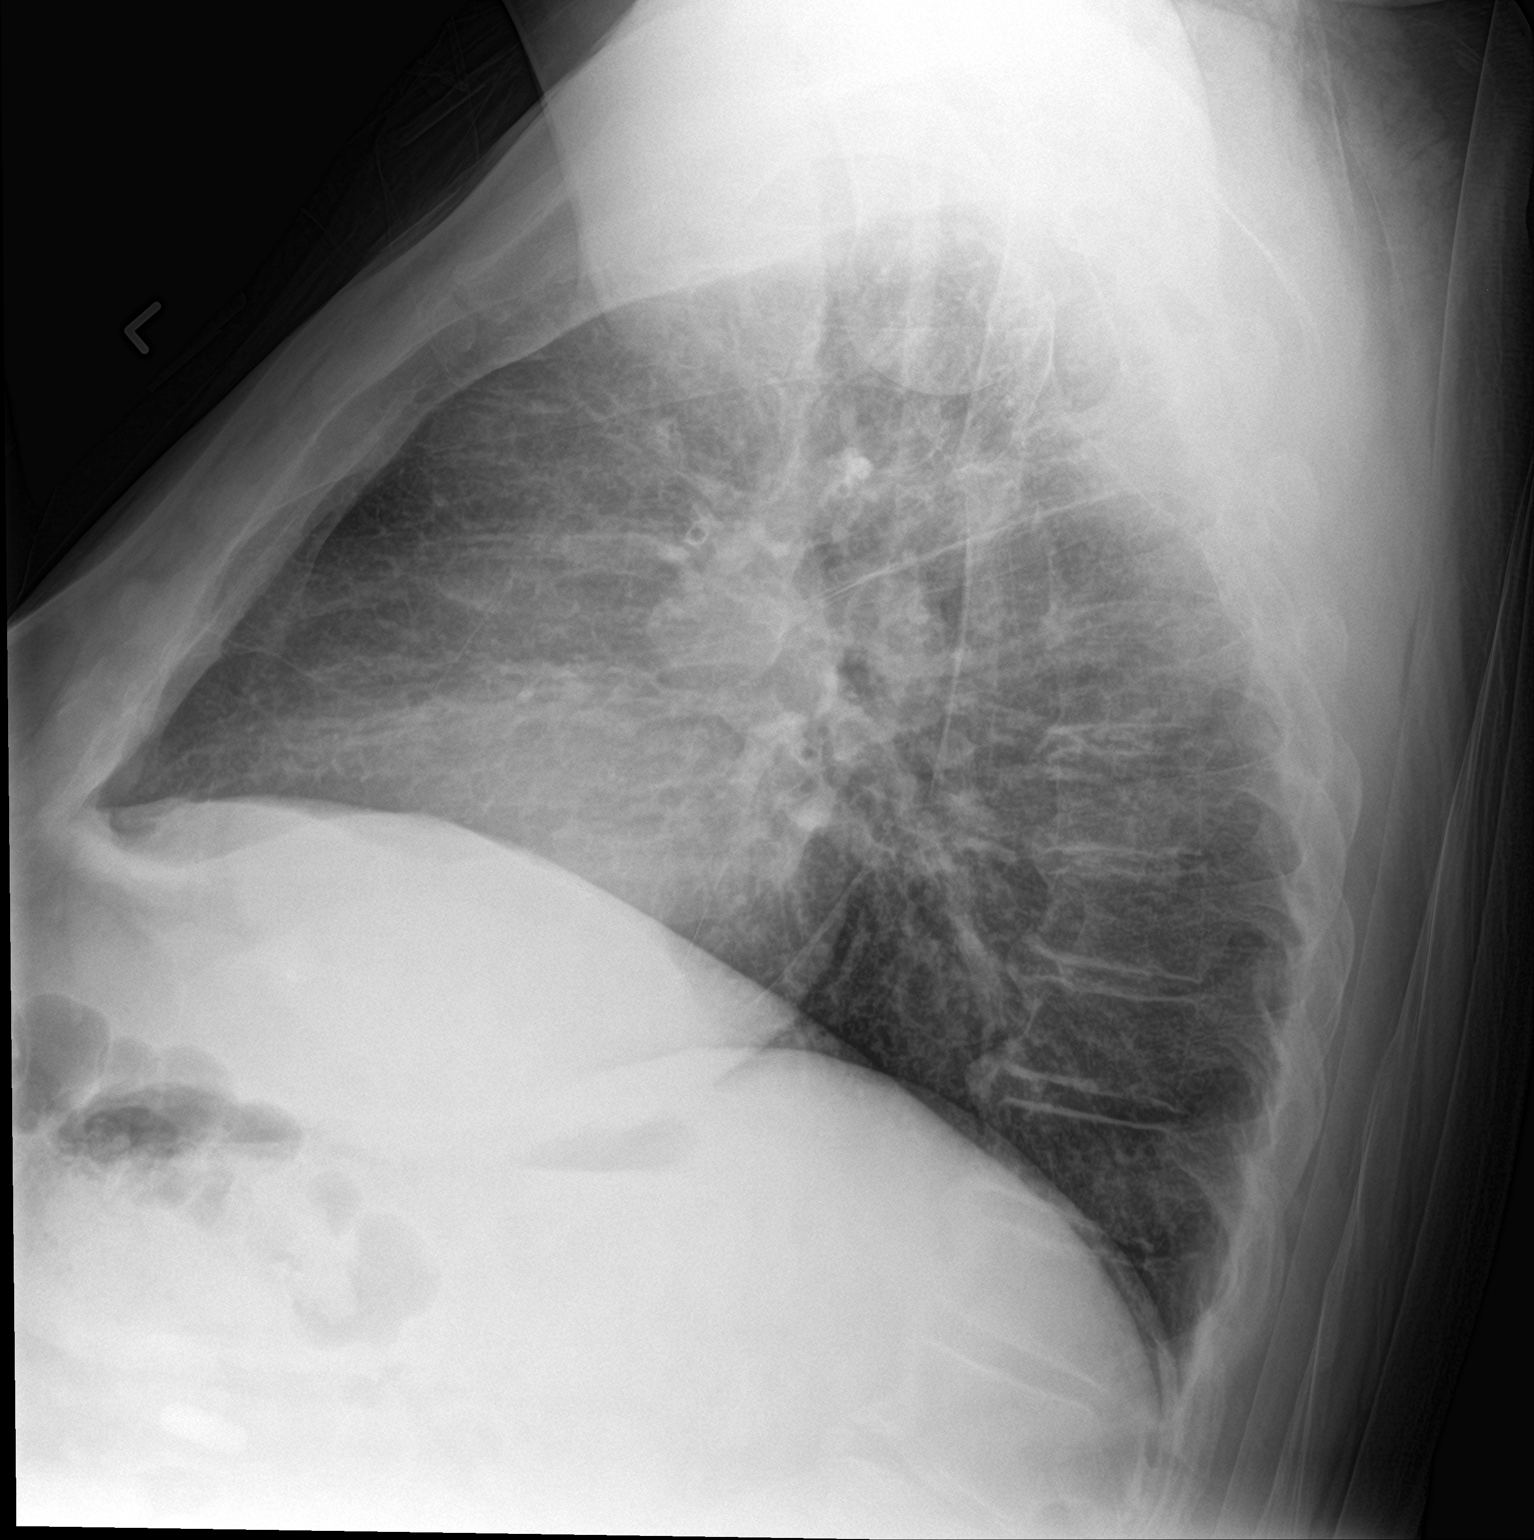

[2 of 2 positions shown; findings below may reference images not displayed]

FINDINGS: Cardiac shadow is mildly enlarged but stable. Lungs are well aerated
bilaterally. Previously seen pleural effusions have resolved in the
interval. Calcified granuloma is again noted in the right lung base.
No acute bony abnormality is noted.
IMPRESSION: No active cardiopulmonary disease.

## 2018-07-22 IMAGING — US US ABDOMEN LIMITED
2 series · 14 of 25 positions shown · non-contrast
Comparison: CT abdomen and pelvis December 06, 2016

CLINICAL DATA: Upper abdominal pain

EXAM:
ULTRASOUND ABDOMEN LIMITED RIGHT UPPER QUADRANT

[Series 1: us abdomen limited · 0.33mm/px · 13 of 47 slices shown (1 of 2)]
[im 1/47]
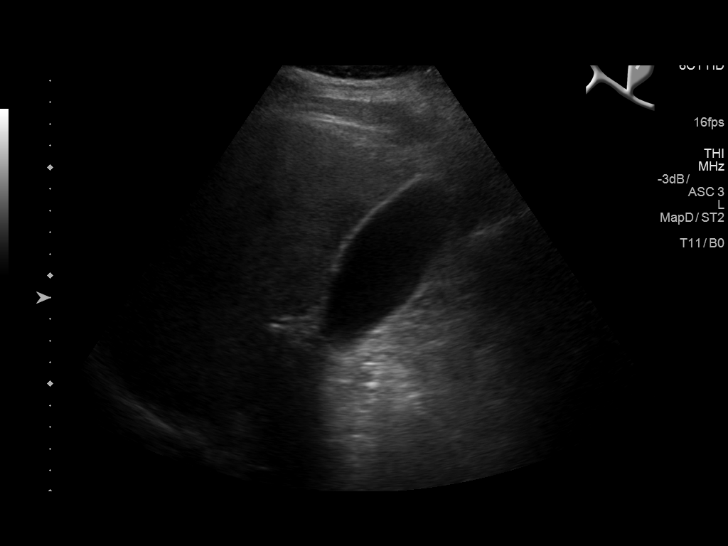
[im 5/47]
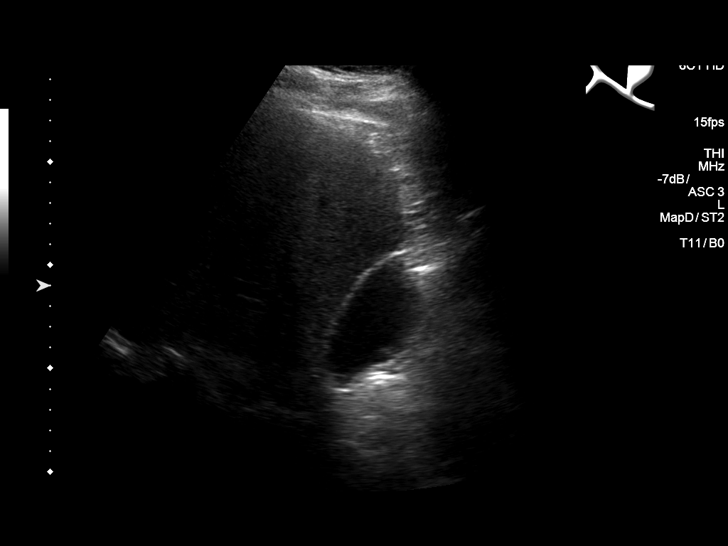
[im 9/47]
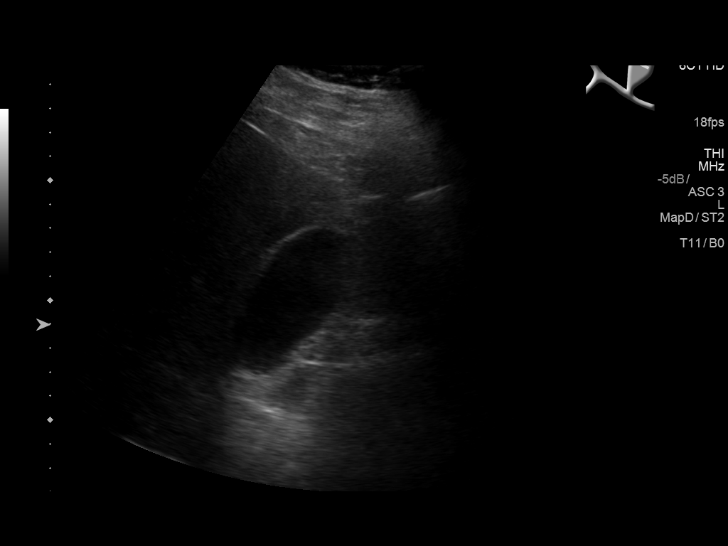
[im 13/47]
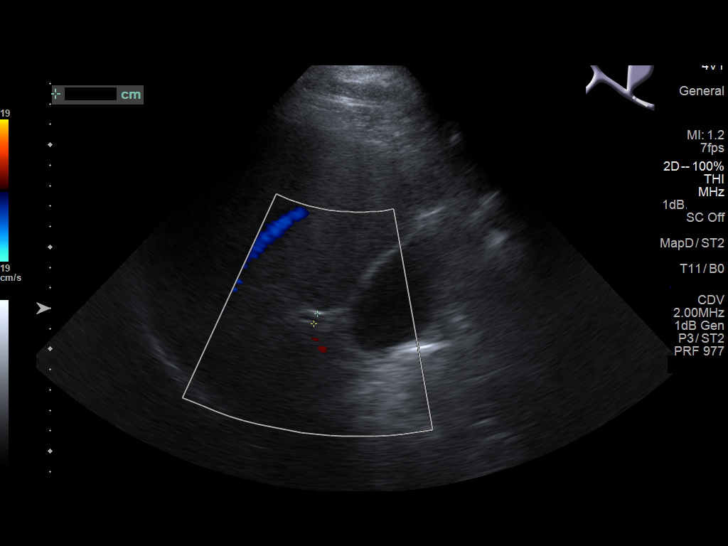
[im 17/47]
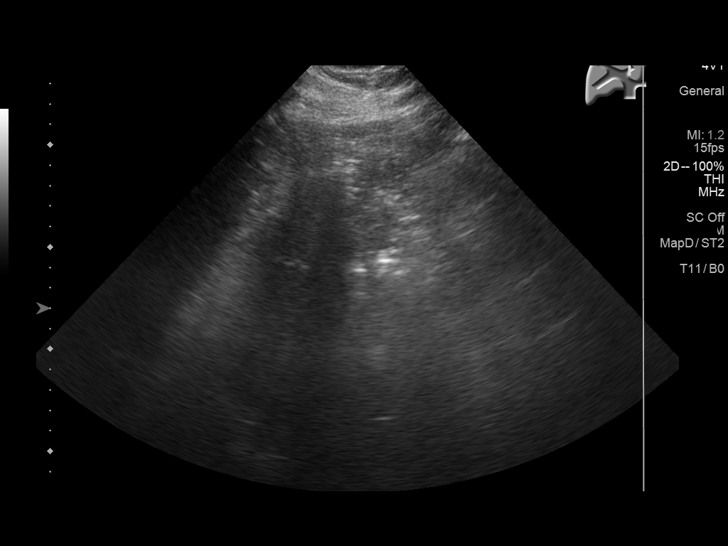
[im 19/47]
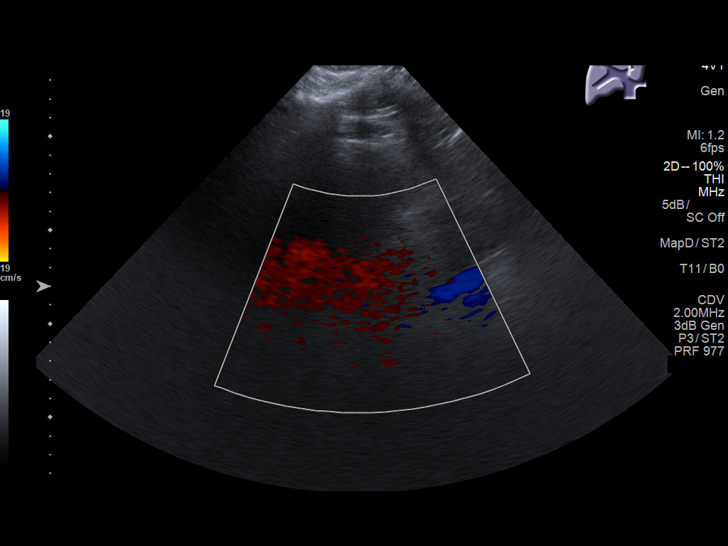
[im 23/47]
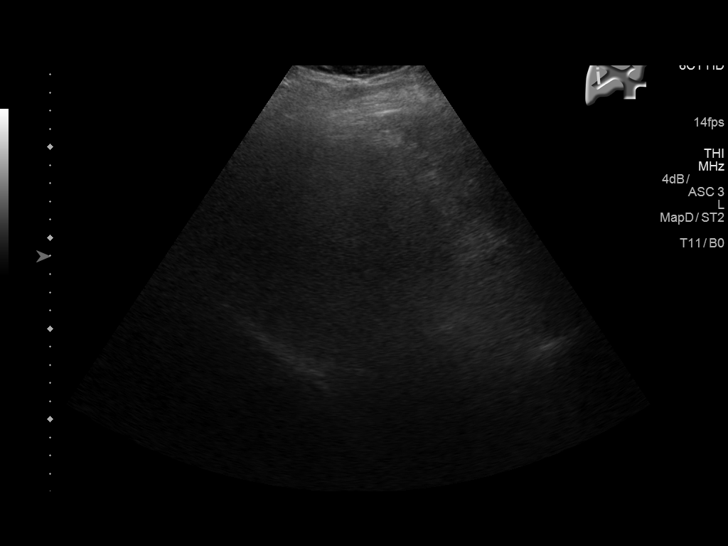
[im 27/47]
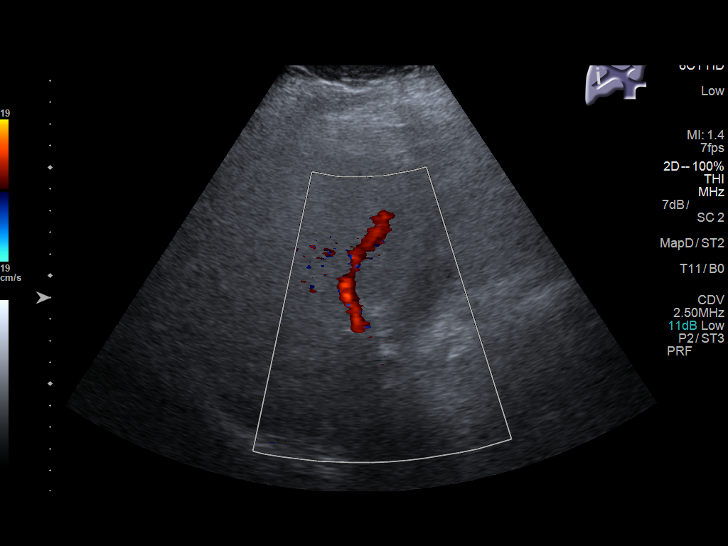
[im 31/47]
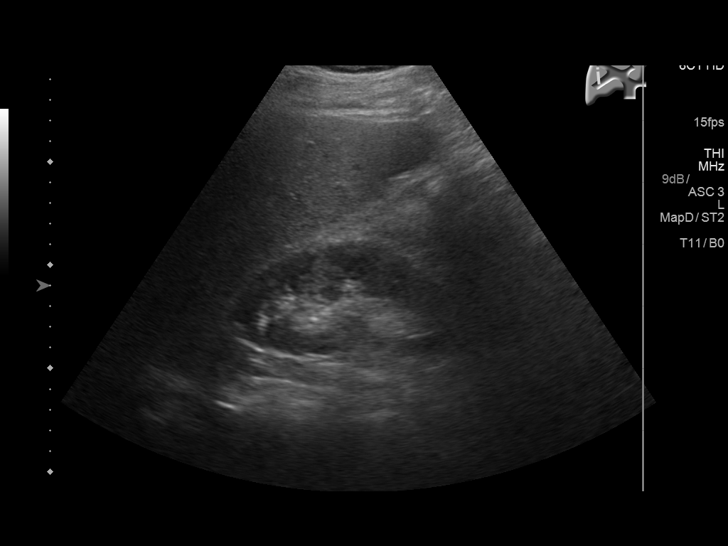
[im 33/47]
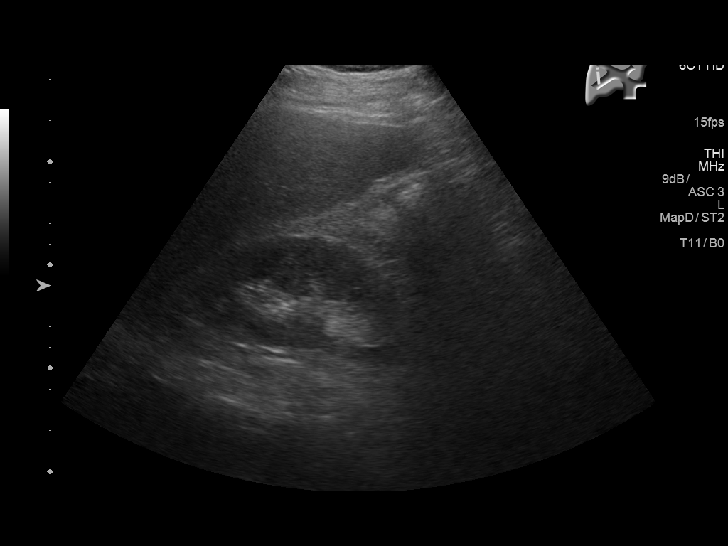
[im 37/47]
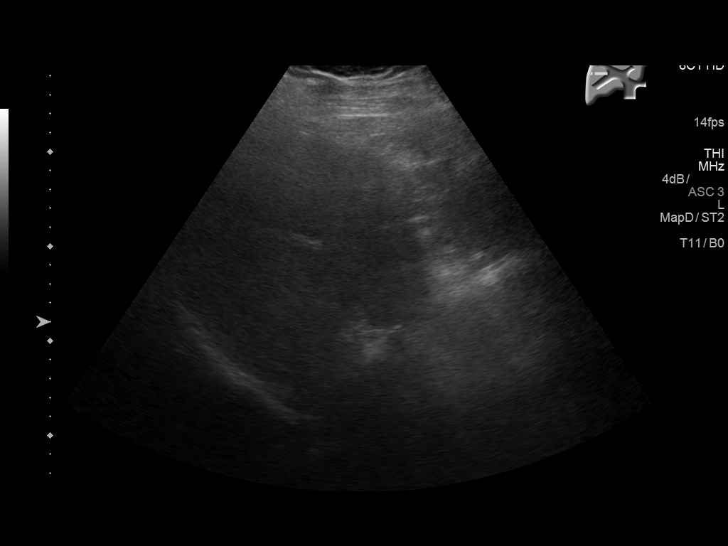
[im 41/47]
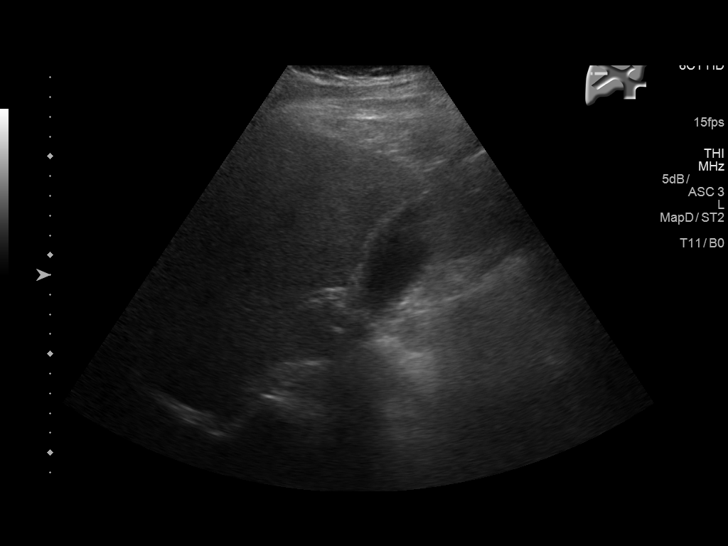
[im 45/47]
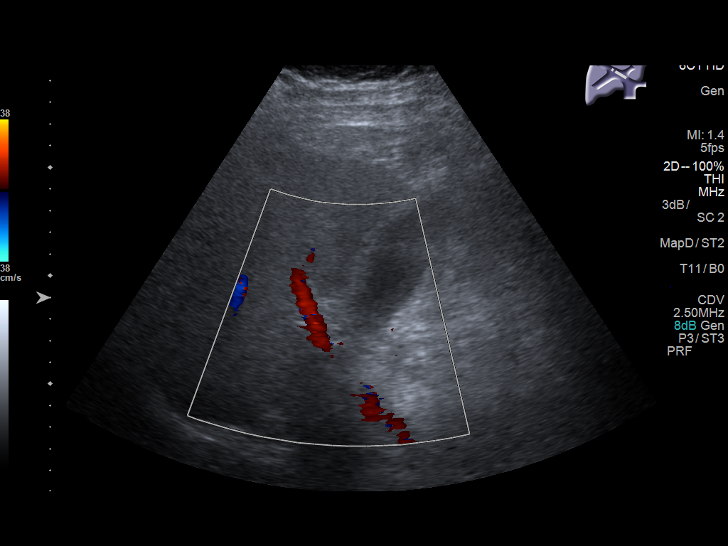

[Series 2: us abdomen limited · 0.30mm/px · 1 of 2 slices shown (2 of 2)]
[im 1/2]
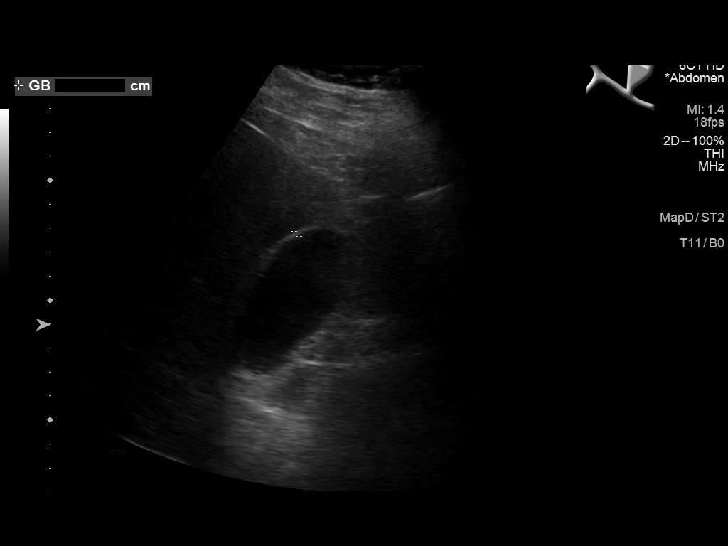

[14 of 25 positions shown; findings below may reference images not displayed]

FINDINGS: Gallbladder:

No gallstones or wall thickening visualized. There is no
pericholecystic fluid. No sonographic Murphy sign noted by
sonographer.

Common bile duct:

Diameter: 5 mm. No intrahepatic or extrahepatic biliary duct
dilatation.

Liver:

No focal lesion identified. Liver echogenicity is diffusely
increased.
IMPRESSION: Diffuse increase in liver echogenicity, a finding most likely
indicative of hepatic steatosis. While no focal liver lesions are
evident on this study, it must be cautioned that the sensitivity of
ultrasound for detection of focal liver lesions is diminished in
this circumstance. Study otherwise unremarkable.

## 2018-07-22 IMAGING — CT CT ANGIO CHEST-ABD-PELV FOR DISSECTION W/ AND WO/W CM
2 of 7 series · 14 of 46 positions shown, 16 images · IV contrast (APPLIED)
Comparison: CT scan of chest January 05, 2017. CT scan of abdomen
and pelvis December 06, 2016.

CLINICAL DATA: Chest pain, shortness of breath.

EXAM:
CT ANGIOGRAPHY CHEST, ABDOMEN AND PELVIS
TECHNIQUE: Multidetector CT imaging through the chest, abdomen and pelvis was
performed using the standard protocol during bolus administration of
intravenous contrast. Multiplanar reconstructed images and MIPs were
obtained and reviewed to evaluate the vascular anatomy.
CONTRAST:  125 mL of Isovue 370 intravenously.

[Series 4: axial arterial · axial · arterial · 0.95mm/px · z∈[-774,-153]mm · 11 of 229 slices shown, 13 images]
[im 11/229  soft-tissue]
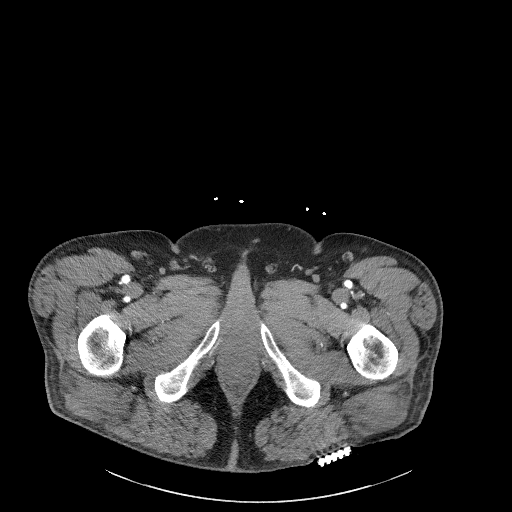
[im 11/229  bone]
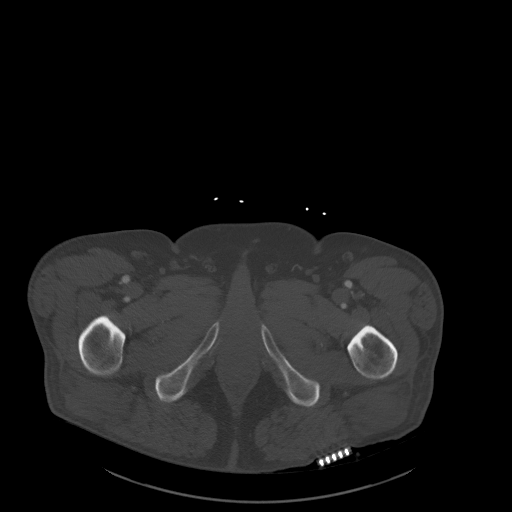
[im 33/229  soft-tissue]
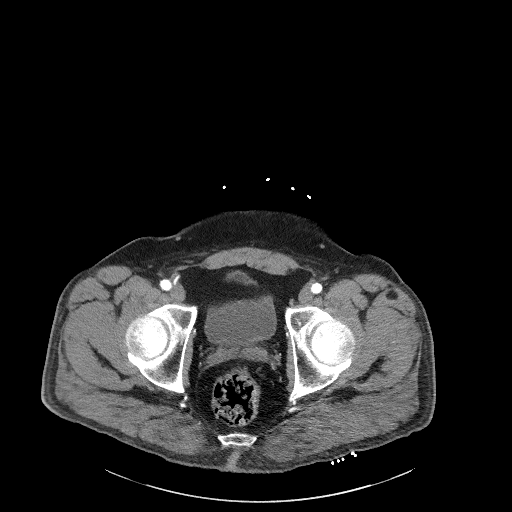
[im 55/229  soft-tissue]
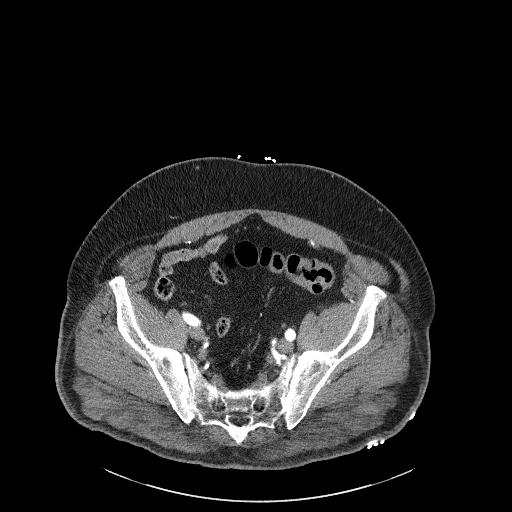
[im 77/229  soft-tissue]
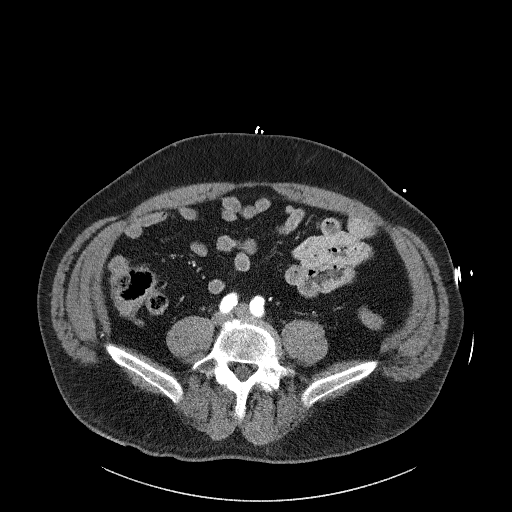
[im 98/229  soft-tissue]
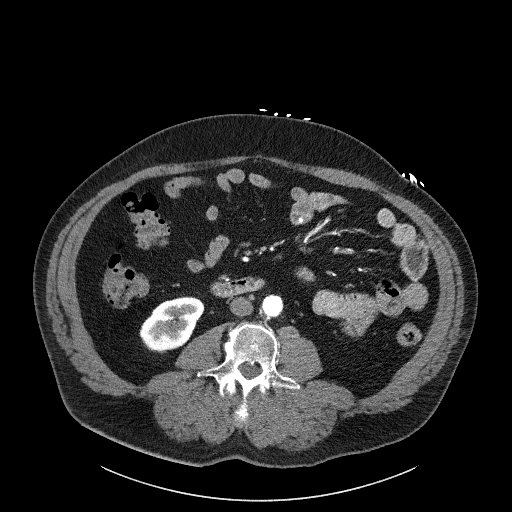
[im 120/229  soft-tissue]
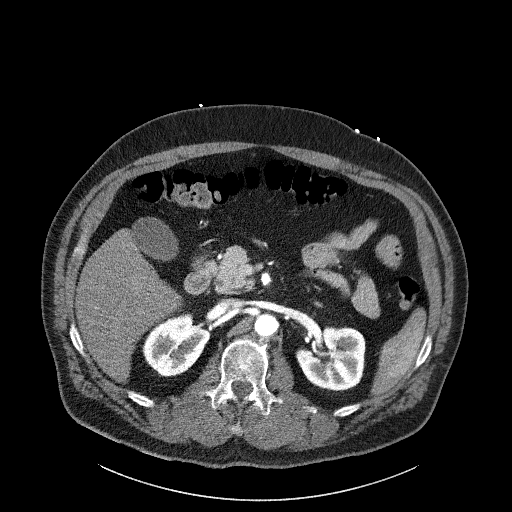
[im 131/229  soft-tissue]
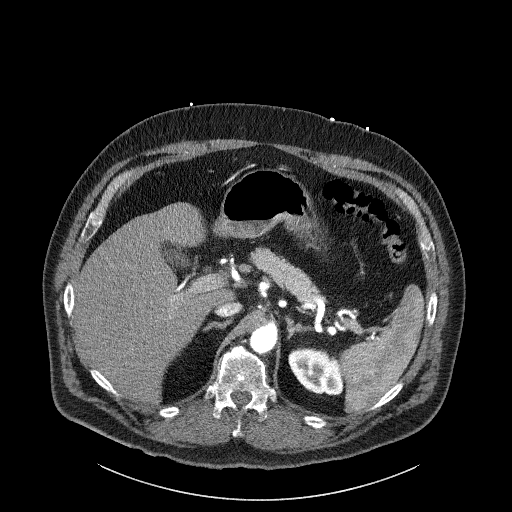
[im 153/229  soft-tissue]
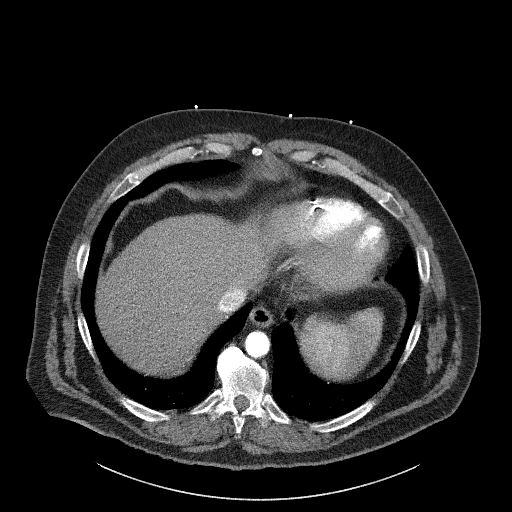
[im 174/229  soft-tissue]
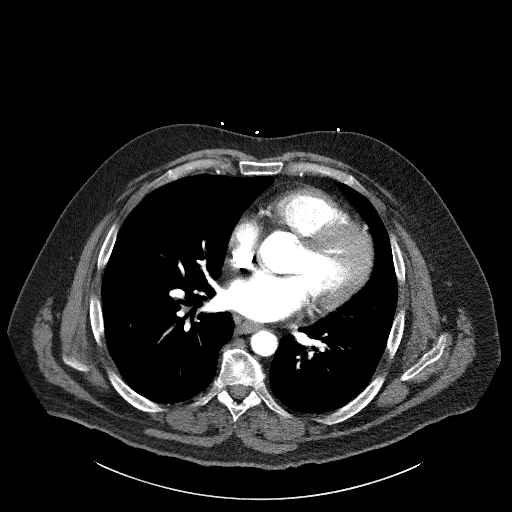
[im 174/229  bone]
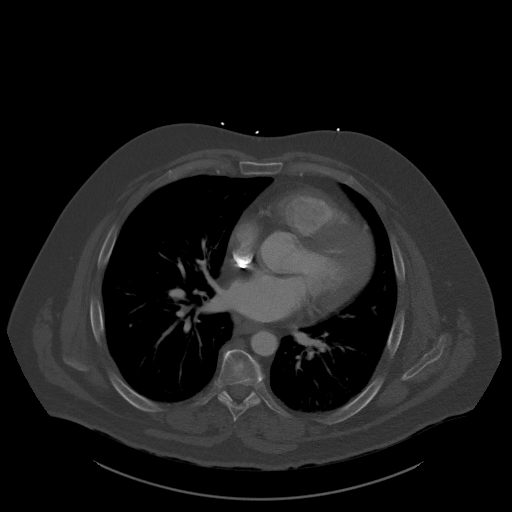
[im 196/229  soft-tissue]
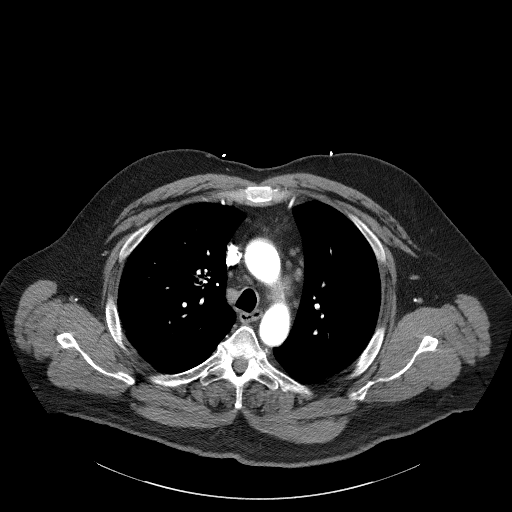
[im 218/229  soft-tissue]
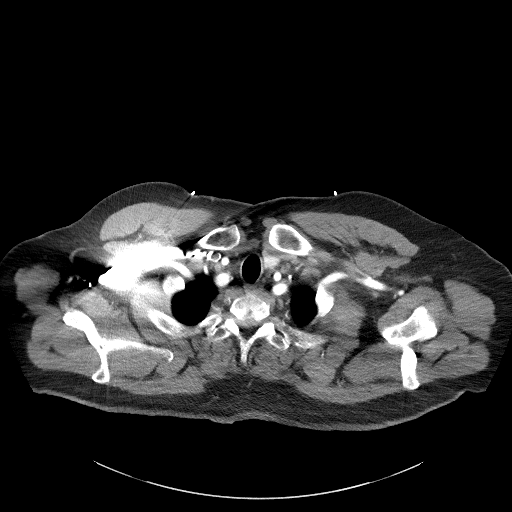

[Series 7: coronals · coronal · 0.86mm/px · 3 of 170 slices shown]
[im 43/170  soft-tissue]
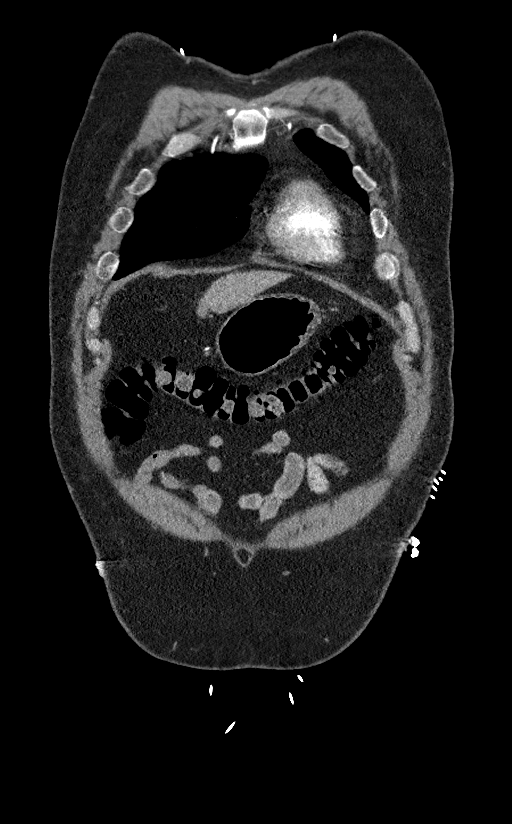
[im 85/170  soft-tissue]
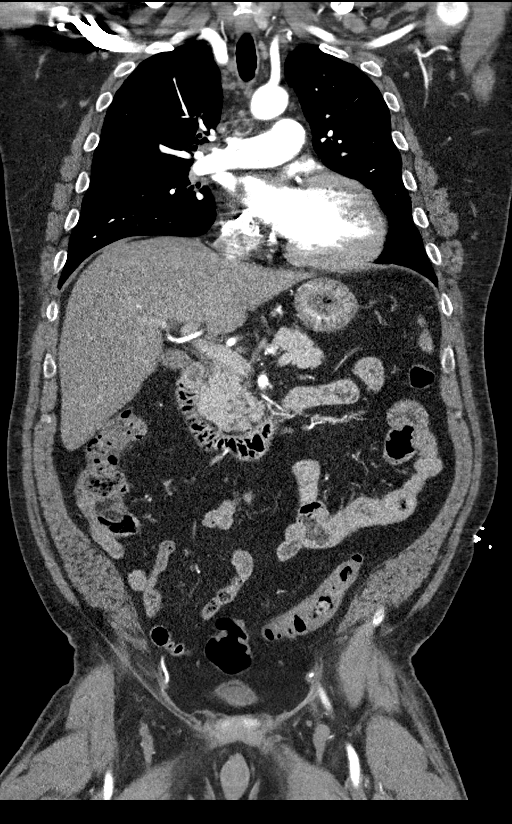
[im 127/170  soft-tissue]
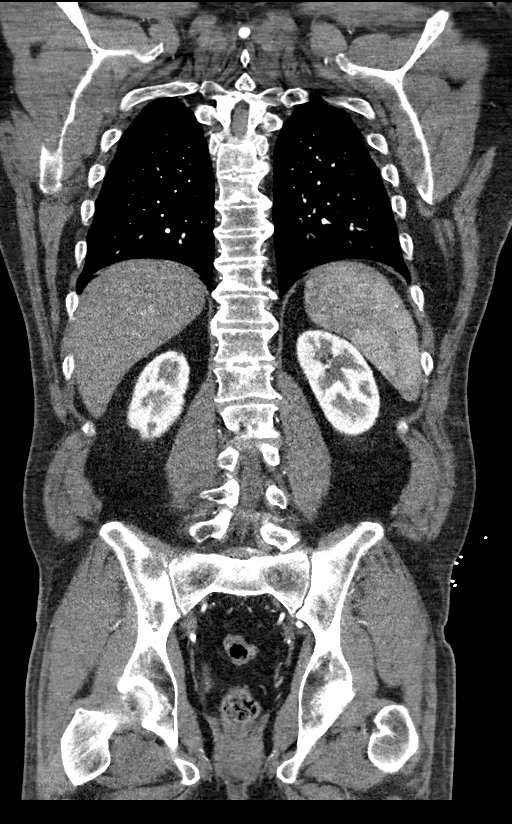

[14 of 46 positions shown; findings below may reference images not displayed]

FINDINGS: CTA CHEST FINDINGS

Cardiovascular: There is no evidence of thoracic aortic dissection
or aneurysm. Great vessels are widely patent. No pericardial
effusion is noted.

Mediastinum/Nodes: Mild mediastinal adenopathy is noted which is
improved compared to prior exam, most consistent with improving
inflammatory or reactive adenopathy. Small left thyroid cyst is
noted.

Lungs/Pleura: Lungs are clear. No pleural effusion or pneumothorax.

Musculoskeletal: No chest wall abnormality. No acute or significant
osseous findings.

Review of the MIP images confirms the above findings.

CTA ABDOMEN AND PELVIS FINDINGS

VASCULAR

Aorta: Normal caliber aorta without aneurysm, dissection, vasculitis
or significant stenosis. Mild atherosclerotic calcifications are
seen in the infrarenal abdominal aorta.

Celiac: Patent without evidence of aneurysm, dissection, vasculitis
or significant stenosis.

SMA: Patent without evidence of aneurysm, dissection, vasculitis or
significant stenosis.

Renals: Both renal arteries are patent without evidence of aneurysm,
dissection, vasculitis, fibromuscular dysplasia or significant
stenosis.

IMA: Patent without evidence of aneurysm, dissection, vasculitis or
significant stenosis.

Inflow: Patent without evidence of aneurysm, dissection, vasculitis
or significant stenosis.

Veins: No obvious venous abnormality within the limitations of this
arterial phase study.

Review of the MIP images confirms the above findings.

NON-VASCULAR

Hepatobiliary: No focal liver abnormality is seen. No gallstones,
gallbladder wall thickening, or biliary dilatation.

Pancreas: Unremarkable. No pancreatic ductal dilatation or
surrounding inflammatory changes.

Spleen: Normal in size without focal abnormality.

Adrenals/Urinary Tract: Adrenal glands are unremarkable. Small
nonobstructive left renal calculus is noted. No hydronephrosis or
renal obstruction is noted. Urinary bladder is unremarkable.

Stomach/Bowel: Stomach is within normal limits. Appendix appears
normal. No evidence of bowel wall thickening, distention, or
inflammatory changes.

Lymphatic: No adenopathy is noted.

Reproductive: Prostate is unremarkable.

Other: No abdominal wall hernia or abnormality. No abdominopelvic
ascites.

Musculoskeletal: No acute or significant osseous findings.

Review of the MIP images confirms the above findings.
IMPRESSION: No evidence of thoracic or abdominal aortic dissection or aneurysm.

Mild atherosclerotic calcifications are seen in infrarenal abdominal
aorta suggesting early atheromatous disease.

Mild mediastinal adenopathy is noted which is decreased compared to
prior exam, most consistent with improving inflammatory or reactive
adenopathy.

Small nonobstructive left renal calculus. No hydronephrosis or renal
obstruction is noted.

## 2018-07-29 ENCOUNTER — Encounter: Payer: Self-pay | Admitting: Internal Medicine

## 2018-07-29 ENCOUNTER — Ambulatory Visit: Payer: Medicaid Other | Admitting: Internal Medicine

## 2018-07-29 VITALS — BP 152/97 | HR 87 | Temp 98.0°F | Ht 73.0 in | Wt 247.0 lb

## 2018-07-29 DIAGNOSIS — E119 Type 2 diabetes mellitus without complications: Secondary | ICD-10-CM

## 2018-07-29 DIAGNOSIS — I10 Essential (primary) hypertension: Secondary | ICD-10-CM

## 2018-07-29 DIAGNOSIS — Z794 Long term (current) use of insulin: Secondary | ICD-10-CM

## 2018-07-29 NOTE — Progress Notes (Signed)
Subjective:    Patient ID: Gabriel [name]., male    DOB: 27-Apr-1971, 47 y.o.   MRN: [id number]  HPI   Patient is a 47 year old male who presents for a follow up appointment at [facility]. Pt reports concerns related to his stomach. He states he's been having bloating and diarrhea after meals for the past 3 weeks. Pt reports that he has woke up in the night with excessive sweating a few times. Pt has been checking FSBS in mornings and evenings. In the mornings his blood sugar runs from 120-140 and in the evening it runs in the low 200's.     Chief Complaint  Patient presents with  . Follow-up    Pt reports that for the last 3 weeks he has had issues with his stomach-- bloating, excessive gas and belching, and diarrhea      Review of Systems Patient Active Problem List   Diagnosis Date Noted  . Chronic systolic heart failure ([place]) 09/02/2017  . Chest pain 08/24/2017  . Dizziness   . Noncompliance with medications 04/29/2017  . Back pain 03/06/2017  . Tobacco use 12/04/2016  . Gallbladder sludge 12/04/2016  . Coronary artery disease, non-occlusive 03/15/2016  . Atypical chest pain 02/16/2016  . Orthostatic hypotension 01/23/2016  . Hypokalemia 01/23/2016  . Hypomagnesemia 01/23/2016  . Congestive dilated cardiomyopathy ([place]) 01/17/2016  . NSVT (nonsustained ventricular tachycardia) ([place])   . Elevated troponin   . Diabetes ([place]) 05/17/2015  . Hyperlipidemia 05/17/2015  . COPD (chronic obstructive pulmonary disease) ([place]) 05/17/2015  . HTN (hypertension) 03/31/2015   Allergies as of 07/29/2018      Reactions   Prednisone Other (See Comments)   Reaction: Hallucinations       Medication List        Accurate as of 07/29/18 11:32 AM. Always use your most recent med list.          aspirin 81 MG chewable tablet Chew 1 tablet (81 mg total) by mouth daily. Reported on 02/08/2016   gabapentin 300 MG capsule Commonly known as:  NEURONTIN Take 1 capsule (300 mg  total) by mouth 3 (three) times daily.   insulin detemir 100 UNIT/ML injection Commonly known as:  LEVEMIR Inject 0.2 mLs (20 Units total) into the skin at bedtime.   metFORMIN 500 MG tablet Commonly known as:  GLUCOPHAGE Take 2 tablets (1000mg ) with breakfast and 1 tablet (500mg ) at dinner.   metoprolol succinate 50 MG 24 hr tablet Commonly known as:  TOPROL-XL Take 2 tablets AM and 1 tablet PM   potassium chloride SA 20 MEQ tablet Commonly known as:  K-DUR,KLOR-CON Take 1 tablet (20 mEq total) by mouth daily.   ranitidine 150 MG tablet Commonly known as:  ZANTAC Take 1 tablet (150 mg total) by mouth 2 (two) times daily.   rosuvastatin 20 MG tablet Commonly known as:  CRESTOR Take 20 mg by mouth daily.   sacubitril-valsartan 97-103 MG Commonly known as:  ENTRESTO Take 1 tablet by mouth 2 (two) times daily.   spironolactone 25 MG tablet Commonly known as:  ALDACTONE Take 1 tablet (25 mg total) by mouth daily.   torsemide 20 MG tablet Commonly known as:  DEMADEX Take 2 tablets (40 mg total) by mouth daily.          Objective:   Physical Exam  BP (!) 152/97   Pulse 87   Temp 98 F (36.7 C) (Oral)   Ht 6\' 1"  (1.854 m)  Wt 247 lb (112 kg)   BMI 32.59 kg/m        Assessment & Plan:   1. Type 2 diabetes mellitus without complication, with long-term current use of insulin (HCC) A1C was 7.7 on 06/17/18 and is down from 9.8. Pt is experiencing night sweats occasionally. For 1 week, STOP Metformin in the evening, continue morning dose.    Check today:  - Hemoglobin A1c - Lipid panel - Urinalysis  2. Essential hypertension Diastolic readings remain above 90. Will revisit hypertension medication regimen at the next visit.   Check Today:  - Comprehensive metabolic panel - CBC  Follow up in 1 week.

## 2018-08-05 ENCOUNTER — Encounter: Payer: Self-pay | Admitting: Internal Medicine

## 2018-08-05 ENCOUNTER — Ambulatory Visit: Payer: Medicaid Other | Admitting: Internal Medicine

## 2018-08-05 VITALS — BP 150/98 | HR 72 | Temp 98.1°F | Ht 71.0 in | Wt 205.4 lb

## 2018-08-05 DIAGNOSIS — I10 Essential (primary) hypertension: Secondary | ICD-10-CM

## 2018-08-05 DIAGNOSIS — E119 Type 2 diabetes mellitus without complications: Secondary | ICD-10-CM

## 2018-08-05 DIAGNOSIS — R109 Unspecified abdominal pain: Secondary | ICD-10-CM

## 2018-08-05 DIAGNOSIS — Z794 Long term (current) use of insulin: Secondary | ICD-10-CM

## 2018-08-05 LAB — COMPREHENSIVE METABOLIC PANEL
ALT: 53 IU/L — ABNORMAL HIGH (ref 0–44)
AST: 34 IU/L (ref 0–40)
Albumin/Globulin Ratio: 1.8 (ref 1.2–2.2)
Albumin: 5.1 g/dL (ref 3.5–5.5)
Alkaline Phosphatase: 88 IU/L (ref 39–117)
BUN/Creatinine Ratio: 15 (ref 9–20)
BUN: 14 mg/dL (ref 6–24)
Bilirubin Total: 1.8 mg/dL — ABNORMAL HIGH (ref 0.0–1.2)
CO2: 29 mmol/L (ref 20–29)
Calcium: 10.2 mg/dL (ref 8.7–10.2)
Chloride: 98 mmol/L (ref 96–106)
Creatinine, Ser: 0.96 mg/dL (ref 0.76–1.27)
GFR calc Af Amer: 107 mL/min/{1.73_m2} (ref >59)
GFR calc non Af Amer: 92 mL/min/{1.73_m2} (ref >59)
Globulin, Total: 2.8 g/dL (ref 1.5–4.5)
Glucose: 214 mg/dL — ABNORMAL HIGH (ref 65–99)
Potassium: 4.7 mmol/L (ref 3.5–5.2)
Sodium: 144 mmol/L (ref 134–144)
Total Protein: 7.9 g/dL (ref 6.0–8.5)

## 2018-08-05 LAB — URINALYSIS
Bilirubin, UA: NEGATIVE
Glucose, UA: NEGATIVE
Ketones, UA: NEGATIVE
Leukocytes, UA: NEGATIVE
Nitrite, UA: NEGATIVE
RBC, UA: NEGATIVE
Specific Gravity, UA: 1.007 (ref 1.005–1.030)
Urobilinogen, Ur: 0.2 mg/dL (ref 0.2–1.0)
pH, UA: 5 (ref 5.0–7.5)

## 2018-08-05 LAB — LIPID PANEL
Chol/HDL Ratio: 6.6 ratio — ABNORMAL HIGH (ref 0.0–5.0)
Cholesterol, Total: 171 mg/dL (ref 100–199)
HDL: 26 mg/dL — ABNORMAL LOW (ref >39)
Triglycerides: 635 mg/dL (ref 0–149)

## 2018-08-05 LAB — SPECIMEN STATUS REPORT

## 2018-08-05 LAB — HEMOGLOBIN A1C
Est. average glucose Bld gHb Est-mCnc: 186 mg/dL
Hgb A1c MFr Bld: 8.1 % — ABNORMAL HIGH (ref 4.8–5.6)

## 2018-08-05 LAB — CBC
Hematocrit: 51 % (ref 37.5–51.0)
Hemoglobin: 17.8 g/dL — ABNORMAL HIGH (ref 13.0–17.7)
MCH: 29 pg (ref 26.6–33.0)
MCHC: 34.9 g/dL (ref 31.5–35.7)
MCV: 83 fL (ref 79–97)
Platelets: 180 10*3/uL (ref 150–450)
RBC: 6.13 x10E6/uL — ABNORMAL HIGH (ref 4.14–5.80)
RDW: 14 % (ref 12.3–15.4)
WBC: 7.1 10*3/uL (ref 3.4–10.8)

## 2018-08-05 MED ORDER — SPIRONOLACTONE 25 MG PO TABS: 25.0000 mg | ORAL_TABLET | Freq: Two times a day (BID) | ORAL | 3 refills | Status: DC

## 2018-08-05 NOTE — Progress Notes (Signed)
Subjective:    Patient ID: Gabriel [name]., male    DOB: 1970/11/20, 47 y.o.   MRN: [id number]  HPI   Patient is 47 year old male who presents for 1 week follow up for Type 2 diabetes. At last visit (07/29/18), pt was asked to stop Metformin in the evening. Pt's abdominal pain has not improved and is still having loose bowel movements with some improvement. Pt has not experienced anymore night sweats since stopping Metformin in the evening.    Chief Complaint  Patient presents with  . Follow-up    Review of Systems Patient Active Problem List   Diagnosis Date Noted  . Chronic systolic heart failure ([place]) 09/02/2017  . Chest pain 08/24/2017  . Dizziness   . Noncompliance with medications 04/29/2017  . Back pain 03/06/2017  . Tobacco use 12/04/2016  . Gallbladder sludge 12/04/2016  . Coronary artery disease, non-occlusive 03/15/2016  . Atypical chest pain 02/16/2016  . Orthostatic hypotension 01/23/2016  . Hypokalemia 01/23/2016  . Hypomagnesemia 01/23/2016  . Congestive dilated cardiomyopathy ([place]) 01/17/2016  . NSVT (nonsustained ventricular tachycardia) ([place])   . Elevated troponin   . Diabetes ([place]) 05/17/2015  . Hyperlipidemia 05/17/2015  . COPD (chronic obstructive pulmonary disease) ([place]) 05/17/2015  . HTN (hypertension) 03/31/2015   Allergies as of 08/05/2018      Reactions   Prednisone Other (See Comments)   Reaction: Hallucinations       Medication List       Accurate as of August 05, 2018 10:13 AM. Always use your most recent med list.        aspirin 81 MG chewable tablet Chew 1 tablet (81 mg total) by mouth daily. Reported on 02/08/2016   gabapentin 300 MG capsule Commonly known as:  NEURONTIN Take 1 capsule (300 mg total) by mouth 3 (three) times daily.   insulin detemir 100 UNIT/ML injection Commonly known as:  LEVEMIR Inject 0.2 mLs (20 Units total) into the skin at bedtime.   metFORMIN 500 MG tablet Commonly known as:  GLUCOPHAGE Take  2 tablets (1000mg ) with breakfast and 1 tablet (500mg ) at dinner.   metoprolol succinate 50 MG 24 hr tablet Commonly known as:  TOPROL-XL Take 2 tablets AM and 1 tablet PM   potassium chloride SA 20 MEQ tablet Commonly known as:  K-DUR,KLOR-CON Take 1 tablet (20 mEq total) by mouth daily.   ranitidine 150 MG tablet Commonly known as:  ZANTAC Take 1 tablet (150 mg total) by mouth 2 (two) times daily.   rosuvastatin 20 MG tablet Commonly known as:  CRESTOR Take 20 mg by mouth daily.   sacubitril-valsartan 97-103 MG Commonly known as:  ENTRESTO Take 1 tablet by mouth 2 (two) times daily.   spironolactone 25 MG tablet Commonly known as:  ALDACTONE Take 1 tablet (25 mg total) by mouth daily.   torsemide 20 MG tablet Commonly known as:  DEMADEX Take 2 tablets (40 mg total) by mouth daily.         Objective:   Physical Exam  BP (!) 150/98   Pulse 72   Temp 98.1 F (36.7 C) (Oral)   Ht 5\' 11"  (1.803 m)   Wt 205 lb 6.4 oz (93.2 kg)   BMI 28.65 kg/m   BP in exam room @ 10:45AM 138/100    Assessment & Plan:   1. Essential hypertension Diastolic BP did not improve throughout visit. Increase Aldactone to 1 25 mg tablet TWICE a day.   Order Today: -  spironolactone (ALDACTONE) 25 MG tablet; Take 1 tablet (25 mg total) by mouth 2 (two) times daily.  Dispense: 90 tablet; Refill: 3  Check in 1 Month:  - Comprehensive metabolic panel; Future - CBC; Future  2. Type 2 diabetes mellitus without complication, with long-term current use of insulin (HCC) FSBS checked in the morning during the last week have remained under 200. Continue to STOP Metformin in evenings.  Check in 1 Month:  - Hemoglobin A1c; Future - Lipid panel; Future  3. Abdominal discomfort Pt advised to go to Emergency Department if abdominal discomfort persists. Previous gallbladder evaluation a year ago was normal without stones and fatty liver noted. Mild liver lab abnormalities noted.   Return in 1  month with labs a week prior.

## 2018-08-24 ENCOUNTER — Ambulatory Visit: Payer: Medicaid Other | Admitting: Family

## 2018-08-27 ENCOUNTER — Encounter: Payer: Self-pay | Admitting: Pharmacist

## 2018-08-27 ENCOUNTER — Encounter: Payer: Self-pay | Admitting: Family

## 2018-08-27 ENCOUNTER — Ambulatory Visit: Payer: Medicaid Other | Attending: Family | Admitting: Family

## 2018-08-27 VITALS — BP 163/101 | HR 79 | Resp 18 | Ht 72.0 in | Wt 245.1 lb

## 2018-08-27 DIAGNOSIS — Z79899 Other long term (current) drug therapy: Secondary | ICD-10-CM | POA: Insufficient documentation

## 2018-08-27 DIAGNOSIS — Z8249 Family history of ischemic heart disease and other diseases of the circulatory system: Secondary | ICD-10-CM | POA: Insufficient documentation

## 2018-08-27 DIAGNOSIS — J449 Chronic obstructive pulmonary disease, unspecified: Secondary | ICD-10-CM | POA: Insufficient documentation

## 2018-08-27 DIAGNOSIS — Z794 Long term (current) use of insulin: Secondary | ICD-10-CM | POA: Insufficient documentation

## 2018-08-27 DIAGNOSIS — I252 Old myocardial infarction: Secondary | ICD-10-CM | POA: Insufficient documentation

## 2018-08-27 DIAGNOSIS — E1122 Type 2 diabetes mellitus with diabetic chronic kidney disease: Secondary | ICD-10-CM | POA: Insufficient documentation

## 2018-08-27 DIAGNOSIS — Z7982 Long term (current) use of aspirin: Secondary | ICD-10-CM | POA: Insufficient documentation

## 2018-08-27 DIAGNOSIS — I10 Essential (primary) hypertension: Secondary | ICD-10-CM

## 2018-08-27 DIAGNOSIS — N183 Chronic kidney disease, stage 3 (moderate): Secondary | ICD-10-CM | POA: Insufficient documentation

## 2018-08-27 DIAGNOSIS — L409 Psoriasis, unspecified: Secondary | ICD-10-CM | POA: Insufficient documentation

## 2018-08-27 DIAGNOSIS — I2510 Atherosclerotic heart disease of native coronary artery without angina pectoris: Secondary | ICD-10-CM | POA: Insufficient documentation

## 2018-08-27 DIAGNOSIS — Z87891 Personal history of nicotine dependence: Secondary | ICD-10-CM | POA: Insufficient documentation

## 2018-08-27 DIAGNOSIS — Z833 Family history of diabetes mellitus: Secondary | ICD-10-CM | POA: Insufficient documentation

## 2018-08-27 DIAGNOSIS — I130 Hypertensive heart and chronic kidney disease with heart failure and stage 1 through stage 4 chronic kidney disease, or unspecified chronic kidney disease: Secondary | ICD-10-CM | POA: Insufficient documentation

## 2018-08-27 DIAGNOSIS — I5022 Chronic systolic (congestive) heart failure: Secondary | ICD-10-CM | POA: Insufficient documentation

## 2018-08-27 DIAGNOSIS — E119 Type 2 diabetes mellitus without complications: Secondary | ICD-10-CM

## 2018-08-27 DIAGNOSIS — E669 Obesity, unspecified: Secondary | ICD-10-CM | POA: Insufficient documentation

## 2018-08-27 DIAGNOSIS — Z888 Allergy status to other drugs, medicaments and biological substances status: Secondary | ICD-10-CM | POA: Insufficient documentation

## 2018-08-27 DIAGNOSIS — Z6833 Body mass index (BMI) 33.0-33.9, adult: Secondary | ICD-10-CM | POA: Insufficient documentation

## 2018-08-27 MED ORDER — METOPROLOL SUCCINATE ER 200 MG PO TB24: 200.0000 mg | ORAL_TABLET | Freq: Every day | ORAL | 3 refills | Status: DC

## 2018-08-27 MED ORDER — ASPIRIN 81 MG PO CHEW: 81.0000 mg | CHEWABLE_TABLET | Freq: Every day | ORAL | 3 refills | Status: DC

## 2018-08-27 MED ORDER — SACUBITRIL-VALSARTAN 97-103 MG PO TABS: 1.0000 | ORAL_TABLET | Freq: Two times a day (BID) | ORAL | 3 refills | Status: DC

## 2018-08-27 MED ORDER — ROSUVASTATIN CALCIUM 20 MG PO TABS: 20.0000 mg | ORAL_TABLET | Freq: Every day | ORAL | 3 refills | Status: DC

## 2018-08-27 NOTE — Progress Notes (Signed)
Patient ID: Gabriel [name]., male    DOB: 23-Aug-1970, 48 y.o.   MRN: [id number]  HPI  [name] is a 48 y/o male with a history of HTN, diabetes, COPD, CKD, asthma, previous tobacco use and chronic heart failure.   Most recent echo done 04/29/17 showed EF 45-50%. Echo report that was done 11/28/16 and it showed an EF of 40-45% along with mild MR. EF has improved from 30-35% July 2017.  Has not been admitted or been in the ED in the last 6 months.  He presents today for a follow-up visit with a chief complaint of minimal shortness of breath upon moderate exertion. He describes this as chronic in nature having been present for several years. He has associated fatigue, chest pain, palpitations, light-headedness and difficulty sleeping along with this. He denies any abdominal distention, pedal edema, cough or weight gain. GI symptoms have improved since stopping his zantac.   Past Medical History:  Diagnosis Date  . Chest wall pain, chronic   . Chronic systolic CHF (congestive heart failure) ([place])    a. 03/2015 Echo: EF 45-50%; b. 12/2015 Echo: EF 20-25%; c. 02/2016 Echo: EF 30-35%; d. 11/2016 Echo: EF 40-45%.  . Chronic Troponin Elevation   . CKD (chronic kidney disease), stage III ([place])   . COPD (chronic obstructive pulmonary disease) ([place])   . Diabetes mellitus without complication ([place])   . Hypertension    Resolved since weight loss  . Myocardial infarction ([place])   . NICM (nonischemic cardiomyopathy) ([place])    a. 03/2015 Echo: EF 45-50%; b. 04/2015 MV: small defect of mild severity in apex 2/2 apical thinning, EF 30-44%;  c. 12/2015 Echo: EF 20-25%; d. 02/2016 Echo: EF 30-35%; e. 11/2016 Echo: EF 40-45%, diff HK, mildly dil Ao root, mild MR, mod dil LA;  f. 12/2016 Cath: LM nl, LAD/Diags/LCX/OMs min irregs, RCA 40p/m/d.  . Non-obstructive CAD (coronary artery disease)    a. 04/2015 low risk MV;  b. 12/2016 Cath: minor irregs in LAD/Diag/LCX/OM, RCA 40p/m/d.  [name] (nonsustained ventricular  tachycardia) ([place])    a. 12/2015 noted on tele-->amio;  b. 12/2015 Event monitor: no VT. Atach noted.  . Obesity (BMI 30.0-34.9)   . Psoriasis   . Renal insufficiency   . Syncope    a. 01/2016 - felt to be vasovagal.  . Tobacco abuse    Past Surgical History:  Procedure Laterality Date  . AMPUTATION    . CARDIAC CATHETERIZATION    . FINGER AMPUTATION     Traumatic  . FINGER FRACTURE SURGERY Left   . LEFT HEART CATH AND CORONARY ANGIOGRAPHY N/A 01/06/2017   Procedure: Left Heart Cath and Coronary Angiography;  Surgeon: Wellington Hampshire, MD;  Location: [facility];  Service: Cardiovascular;  Laterality: N/A;   Family History  Problem Relation Age of Onset  . Diabetes Mellitus II Mother   . Hypertension Father   . Cancer Paternal Aunt   . Cancer Maternal Grandfather   . Diabetes Maternal Grandfather    Social History   Tobacco Use  . Smoking status: Former Smoker    Packs/day: 1.00    Years: 33.00    Pack years: 33.00    Types: Cigarettes  . Smokeless tobacco: Never Used  . Tobacco comment: Quit a little over a month ago.  Substance Use Topics  . Alcohol use: No    Alcohol/week: 0.0 standard drinks    Frequency: Never    Comment: occassionally  Allergies  Allergen Reactions  . Prednisone Other (See Comments)    Reaction: Hallucinations     Prior to Admission medications   Medication Sig Start Date End Date Taking? Authorizing Provider  aspirin 81 MG chewable tablet Chew 1 tablet (81 mg total) by mouth daily. Reported on 02/08/2016 06/02/17  Yes [name] A, FNP  insulin detemir (LEVEMIR) 100 UNIT/ML injection Inject 0.2 mLs (20 Units total) into the skin at bedtime. 04/08/18  Yes Tawni Millers, MD  metFORMIN (GLUCOPHAGE) 500 MG tablet Take 2 tablets (1000mg ) with breakfast and 1 tablet (500mg ) at dinner. 04/08/18  Yes Tawni Millers, MD  metoprolol succinate (TOPROL-XL) 50 MG 24 hr tablet Take 2 tablets AM and 1 tablet PM 03/12/18  Yes Danaja Lasota A, FNP   potassium chloride SA (K-DUR,KLOR-CON) 20 MEQ tablet Take 1 tablet (20 mEq total) by mouth daily. 06/22/18  Yes Averie Meiner, Otila Kluver A, FNP  rosuvastatin (CRESTOR) 20 MG tablet Take 20 mg by mouth daily.   Yes [provider]  sacubitril-valsartan (ENTRESTO) 97-103 MG Take 1 tablet by mouth 2 (two) times daily. 01/05/18  Yes Minna Merritts, MD  spironolactone (ALDACTONE) 25 MG tablet Take 1 tablet (25 mg total) by mouth 2 (two) times daily. 08/05/18  Yes Tawni Millers, MD  torsemide (DEMADEX) 20 MG tablet Take 2 tablets (40 mg total) by mouth daily. 06/22/18  Yes Alisa Graff, FNP     Review of Systems  Constitutional: Positive for fatigue. Negative for appetite change.  HENT: Positive for congestion. Negative for postnasal drip and sore throat.   Eyes: Negative.   Respiratory: Positive for shortness of breath (at times). Negative for cough and chest tightness.   Cardiovascular: Positive for chest pain ("just the usual") and palpitations. Negative for leg swelling.  Gastrointestinal: Negative for abdominal distention and abdominal pain.  Endocrine: Negative.   Genitourinary: Negative.   Musculoskeletal: Positive for back pain.  Skin: Negative.   Allergic/Immunologic: Negative.   Neurological: Positive for light-headedness (some days), numbness (down both legs at times) and headaches (constant pressure on top of the head). Negative for dizziness.  Hematological: Negative for adenopathy. Does not bruise/bleed easily.  Psychiatric/Behavioral: Positive for sleep disturbance (due to pain down his legs). Negative for dysphoric mood and suicidal ideas. The patient is not nervous/anxious.    Vitals:   08/27/18 0933  BP: (!) 163/101  Pulse: 79  Resp: 18  SpO2: 99%  Weight: 245 lb 2 oz (111.2 kg)  Height: 6' (1.829 m)   Wt Readings from Last 3 Encounters:  08/27/18 245 lb 2 oz (111.2 kg)  08/05/18 205 lb 6.4 oz (93.2 kg)  07/29/18 247 lb (112 kg)   Lab Results  Component Value  Date   CREATININE 0.96 07/29/2018   CREATININE 0.87 06/17/2018   CREATININE 0.92 04/01/2018    Physical Exam  Constitutional: He is oriented to person, place, and time. He appears well-developed and well-nourished.  HENT:  Head: Normocephalic and atraumatic.  Neck: Normal range of motion. Neck supple. No JVD present.  Cardiovascular: Normal rate and regular rhythm.  Pulmonary/Chest: Effort normal. He has no wheezes. He has no rales. He exhibits no tenderness.  Abdominal: Soft. He exhibits no distension. There is no abdominal tenderness.  Musculoskeletal:        General: No tenderness or edema.  Neurological: He is alert and oriented to person, place, and time.  Skin: Skin is warm and dry.  Psychiatric: He has a normal mood and affect.  His behavior is normal. Thought content normal.  Nursing note and vitals reviewed.   Assessment & Plan:  1: Chronic heart failure with reduced ejection fraction- - NYHA class II - euvolemic today - weighing daily and reminded to call for an overnight weight gain of >2 pounds or a weekly weight gain of >5 pounds.  - weight unchanged from last visit 2 months ago - not adding salt and he was encouraged to closely follow a 2000mg  sodium diet - has not received his flu vaccine yet for this season - saw cardiologist Rockey Situ) 01/05/18  - BNP from 04/28/17 was 25.0 - patient develops angina upon exertion and, therefore, is unable to work  2: HTN- - BP elevated today (163/101) and was no better upon recheck - increase his metoprolol succinate to 200mg  daily; he can finish out his current bottle and then begin the 200mg  dose - discussed possibly adding amlodipine or hydralazine to regimen - spironolactone increased to 25mg  BID on 08/05/18 by PCP; BMP to be checked mid January 2020 - saw PCP @ [facility] 08/05/18  - BMP from 07/29/18 reviewed and showed sodium 144, potassium 4.7, creatinine 0.96 and GFR 92  3: Diabetes-   -  A1c 8.1% on 07/29/18 -  says glucose at home has been running 130's-145 - followed by [facility] - completed some classes at the [facility]  Patient did not bring his medications nor a list. Each medication was verbally reviewed with the patient and he was encouraged to bring the bottles to every visit to confirm accuracy of list.  PCP seeing him in ~ 2 weeks, cardiology has been scheduled for mid February so will have patient return here in 2 months or sooner for any questions/problems before then.

## 2018-08-27 NOTE — Patient Instructions (Addendum)
Continue weighing daily and call for an overnight weight gain of > 2 pounds or a weekly weight gain of >5 pounds.  Finish current metoprolol bottle at home and when you pick up the new bottle from medication management clinic, you will start taking 1 tablet daily that is 200mg  daily.

## 2018-08-27 NOTE — Progress Notes (Signed)
Patient declined a visit from the pharmacist.

## 2018-09-02 ENCOUNTER — Other Ambulatory Visit: Payer: Medicaid Other

## 2018-09-02 DIAGNOSIS — I10 Essential (primary) hypertension: Secondary | ICD-10-CM

## 2018-09-02 DIAGNOSIS — Z794 Long term (current) use of insulin: Secondary | ICD-10-CM

## 2018-09-02 DIAGNOSIS — E119 Type 2 diabetes mellitus without complications: Secondary | ICD-10-CM

## 2018-09-03 LAB — COMPREHENSIVE METABOLIC PANEL

## 2018-09-03 LAB — HEMOGLOBIN A1C
Est. average glucose Bld gHb Est-mCnc: 183 mg/dL
Hgb A1c MFr Bld: 8 % — ABNORMAL HIGH (ref 4.8–5.6)

## 2018-09-03 LAB — CBC
Hematocrit: 54.4 % — ABNORMAL HIGH (ref 37.5–51.0)
Hemoglobin: 18.2 g/dL — ABNORMAL HIGH (ref 13.0–17.7)
MCH: 28.7 pg (ref 26.6–33.0)
MCHC: 33.5 g/dL (ref 31.5–35.7)
MCV: 86 fL (ref 79–97)
Platelets: 201 10*3/uL (ref 150–450)
RBC: 6.35 x10E6/uL — ABNORMAL HIGH (ref 4.14–5.80)
RDW: 13.6 % (ref 11.6–15.4)
WBC: 8.5 10*3/uL (ref 3.4–10.8)

## 2018-09-03 LAB — LIPID PANEL

## 2018-09-08 ENCOUNTER — Telehealth: Payer: Self-pay | Admitting: Pharmacist

## 2018-09-08 NOTE — Telephone Encounter (Signed)
09/08/2018 9:13:38 AM - [name] renewal with [employer]  09/08/2018 Faxed [employer] application for renewal on [employer] 97/103mg  Take 1 tablet by mouth two times a day.[name]

## 2018-09-09 ENCOUNTER — Ambulatory Visit: Payer: Medicaid Other | Admitting: Internal Medicine

## 2018-09-09 ENCOUNTER — Encounter: Payer: Self-pay | Admitting: Internal Medicine

## 2018-09-09 VITALS — BP 137/89 | HR 92 | Temp 98.3°F | Ht 72.0 in | Wt 245.8 lb

## 2018-09-09 DIAGNOSIS — R5383 Other fatigue: Secondary | ICD-10-CM

## 2018-09-09 DIAGNOSIS — E119 Type 2 diabetes mellitus without complications: Secondary | ICD-10-CM

## 2018-09-09 DIAGNOSIS — I10 Essential (primary) hypertension: Secondary | ICD-10-CM

## 2018-09-09 DIAGNOSIS — Z794 Long term (current) use of insulin: Secondary | ICD-10-CM

## 2018-09-09 DIAGNOSIS — K219 Gastro-esophageal reflux disease without esophagitis: Secondary | ICD-10-CM

## 2018-09-09 MED ORDER — PANTOPRAZOLE SODIUM 20 MG PO TBEC: 20.0000 mg | DELAYED_RELEASE_TABLET | Freq: Every day | ORAL | 3 refills | Status: DC

## 2018-09-09 NOTE — Progress Notes (Signed)
Subjective:    Patient ID: Gabriel [name]., male    DOB: 12/28/1970, 48 y.o.   MRN: [id number]  HPI   Patient is 48 year old male who presents for follow up appointment. Pt concerned about acid reflux symptoms, describes a burning feeling in his throat. Pt concerned about low testosterone levels due to low libido and lack of energy.  Chief Complaint  Patient presents with  . Follow-up    Recent blood work; concerned about Testosterone levels     Review of Systems Patient Active Problem List   Diagnosis Date Noted  . Chronic systolic heart failure ([place]) 09/02/2017  . Chest pain 08/24/2017  . Dizziness   . Noncompliance with medications 04/29/2017  . Back pain 03/06/2017  . Tobacco use 12/04/2016  . Gallbladder sludge 12/04/2016  . Coronary artery disease, non-occlusive 03/15/2016  . Atypical chest pain 02/16/2016  . Orthostatic hypotension 01/23/2016  . Hypokalemia 01/23/2016  . Hypomagnesemia 01/23/2016  . Congestive dilated cardiomyopathy ([place]) 01/17/2016  . NSVT (nonsustained ventricular tachycardia) ([place])   . Elevated troponin   . Diabetes ([place]) 05/17/2015  . Hyperlipidemia 05/17/2015  . COPD (chronic obstructive pulmonary disease) ([place]) 05/17/2015  . HTN (hypertension) 03/31/2015   Allergies as of 09/09/2018      Reactions   Prednisone Other (See Comments)   Reaction: Hallucinations    Zantac [ranitidine Hcl] Diarrhea, Nausea Only   Night sweats      Medication List       Accurate as of September 09, 2018 10:00 AM. Always use your most recent med list.        aspirin 81 MG chewable tablet Chew 1 tablet (81 mg total) by mouth daily. Reported on 02/08/2016   insulin detemir 100 UNIT/ML injection Commonly known as:  LEVEMIR Inject 0.2 mLs (20 Units total) into the skin at bedtime.   metFORMIN 500 MG tablet Commonly known as:  GLUCOPHAGE Take 2 tablets (1063m) with breakfast and 1 tablet (5051m at dinner.   metoprolol 200 MG 24 hr tablet Commonly  known as:  TOPROL XL Take 1 tablet (200 mg total) by mouth daily.   potassium chloride SA 20 MEQ tablet Commonly known as:  K-DUR,KLOR-CON Take 1 tablet (20 mEq total) by mouth daily.   rosuvastatin 20 MG tablet Commonly known as:  CRESTOR Take 1 tablet (20 mg total) by mouth daily.   sacubitril-valsartan 97-103 MG Commonly known as:  ENTRESTO Take 1 tablet by mouth 2 (two) times daily.   spironolactone 25 MG tablet Commonly known as:  ALDACTONE Take 1 tablet (25 mg total) by mouth 2 (two) times daily.   torsemide 20 MG tablet Commonly known as:  DEMADEX Take 2 tablets (40 mg total) by mouth daily.          Objective:   Physical Exam Cardiovascular:     Rate and Rhythm: Normal rate and regular rhythm.  Pulmonary:     Effort: Pulmonary effort is normal.     Breath sounds: Normal breath sounds.  Neurological:     Mental Status: He is alert and oriented to person, place, and time.   Edema of the lower extremities does not appear to be present.      Assessment & Plan:  1. Essential hypertension Controlled. Weight maintained.   Check Today:  - Comprehensive metabolic panel *Reordering due to [facility] reporting that they did not receive pt's serum.   Check in 3 Months:  - CBC; Future - Comprehensive metabolic panel; Future  2. Fatigue, unspecified type Check Today:  - TSH - Testosterone  Advised to discuss concern about low testosterone with [name], to better understand how potential drugs to increase testosterone could interact with heart medications.   3. Gastroesophageal reflux disease, esophagitis presence not specified Order Today:  - pantoprazole (PROTONIX) 20 MG tablet; Take 1 tablet (20 mg total) by mouth at bedtime.  Dispense: 90 tablet; Refill: 3  4. Type 2 diabetes mellitus without complication, with long-term current use of insulin (HCC) A1C improved from 8.1 to 8.0   Check in 3 Months:  - Urinalysis; Future - Hemoglobin A1c;  Future   Return in 3 months with labs (CBC, Met C, UA, A1C) a week prior.

## 2018-09-10 LAB — COMPREHENSIVE METABOLIC PANEL
ALT: 42 IU/L (ref 0–44)
AST: 25 IU/L (ref 0–40)
Albumin/Globulin Ratio: 1.7 (ref 1.2–2.2)
Albumin: 5.2 g/dL — ABNORMAL HIGH (ref 4.0–5.0)
Alkaline Phosphatase: 91 IU/L (ref 39–117)
BUN/Creatinine Ratio: 16 (ref 9–20)
BUN: 20 mg/dL (ref 6–24)
Bilirubin Total: 1.7 mg/dL — ABNORMAL HIGH (ref 0.0–1.2)
CO2: 26 mmol/L (ref 20–29)
Calcium: 10.7 mg/dL — ABNORMAL HIGH (ref 8.7–10.2)
Chloride: 93 mmol/L — ABNORMAL LOW (ref 96–106)
Creatinine, Ser: 1.24 mg/dL (ref 0.76–1.27)
GFR calc Af Amer: 78 mL/min/{1.73_m2} (ref >59)
GFR calc non Af Amer: 68 mL/min/{1.73_m2} (ref >59)
Globulin, Total: 3.1 g/dL (ref 1.5–4.5)
Glucose: 320 mg/dL — ABNORMAL HIGH (ref 65–99)
Potassium: 5.4 mmol/L — ABNORMAL HIGH (ref 3.5–5.2)
Sodium: 141 mmol/L (ref 134–144)
Total Protein: 8.3 g/dL (ref 6.0–8.5)

## 2018-09-10 LAB — TSH: TSH: 1.74 u[IU]/mL (ref 0.450–4.500)

## 2018-09-10 LAB — TESTOSTERONE: Testosterone: 145 ng/dL — ABNORMAL LOW (ref 264–916)

## 2018-09-17 ENCOUNTER — Telehealth: Payer: Self-pay | Admitting: Pharmacist

## 2018-09-17 NOTE — Telephone Encounter (Signed)
09/17/2018 11:56:11 AM - [name] & tips refill  09/17/2018 Taking Novo Nordisk refill request to [facility] for [name] Inject 20 units daily at bedtime, & Novofine 32G tips.Gabriel [name]

## 2018-09-30 ENCOUNTER — Encounter: Payer: Self-pay | Admitting: Internal Medicine

## 2018-09-30 ENCOUNTER — Ambulatory Visit: Payer: Medicaid Other | Admitting: Internal Medicine

## 2018-09-30 VITALS — BP 150/95 | HR 63 | Wt 247.6 lb

## 2018-09-30 DIAGNOSIS — E119 Type 2 diabetes mellitus without complications: Secondary | ICD-10-CM

## 2018-09-30 DIAGNOSIS — I10 Essential (primary) hypertension: Secondary | ICD-10-CM

## 2018-09-30 DIAGNOSIS — Z794 Long term (current) use of insulin: Secondary | ICD-10-CM

## 2018-09-30 MED ORDER — INSULIN DETEMIR 100 UNIT/ML ~~LOC~~ SOLN: 24.0000 [IU] | Freq: Every day | SUBCUTANEOUS | 3 refills | Status: DC

## 2018-09-30 MED ORDER — METFORMIN HCL 500 MG PO TABS: ORAL_TABLET | ORAL | 3 refills | Status: DC

## 2018-09-30 NOTE — Progress Notes (Signed)
Subjective:    Patient ID: Gabriel [name]., male    DOB: [birth year], 48 y.o.   MRN: [id number]  HPI   Patient is a 48 year old male who presents for follow up.   Chief Complaint  Patient presents with  . Follow-up    Review of Systems Patient Active Problem List   Diagnosis Date Noted  . Chronic systolic heart failure ([place]) 09/02/2017  . Chest pain 08/24/2017  . Dizziness   . Noncompliance with medications 04/29/2017  . Back pain 03/06/2017  . Tobacco use 12/04/2016  . Gallbladder sludge 12/04/2016  . Coronary artery disease, non-occlusive 03/15/2016  . Atypical chest pain 02/16/2016  . Orthostatic hypotension 01/23/2016  . Hypokalemia 01/23/2016  . Hypomagnesemia 01/23/2016  . Congestive dilated cardiomyopathy ([place]) 01/17/2016  . NSVT (nonsustained ventricular tachycardia) ([place])   . Elevated troponin   . Diabetes ([place]) 05/17/2015  . Hyperlipidemia 05/17/2015  . COPD (chronic obstructive pulmonary disease) ([place]) 05/17/2015  . HTN (hypertension) 03/31/2015   Allergies as of 09/30/2018      Reactions   Prednisone Other (See Comments)   Reaction: Hallucinations    Zantac [ranitidine Hcl] Diarrhea, Nausea Only   Night sweats      Medication List       Accurate as of September 30, 2018 10:34 AM. Always use your most recent med list.        aspirin 81 MG chewable tablet Chew 1 tablet (81 mg total) by mouth daily. Reported on 02/08/2016   insulin detemir 100 UNIT/ML injection Commonly known as:  LEVEMIR Inject 0.2 mLs (20 Units total) into the skin at bedtime.   metFORMIN 500 MG tablet Commonly known as:  GLUCOPHAGE Take 2 tablets (1000mg ) with breakfast and 1 tablet (500mg ) at dinner.   metoprolol 200 MG 24 hr tablet Commonly known as:  TOPROL XL Take 1 tablet (200 mg total) by mouth daily.   pantoprazole 20 MG tablet Commonly known as:  PROTONIX Take 1 tablet (20 mg total) by mouth at bedtime.   potassium chloride SA 20 MEQ tablet Commonly known as:   K-DUR,KLOR-CON Take 1 tablet (20 mEq total) by mouth daily.   rosuvastatin 20 MG tablet Commonly known as:  CRESTOR Take 1 tablet (20 mg total) by mouth daily.   sacubitril-valsartan 97-103 MG Commonly known as:  ENTRESTO Take 1 tablet by mouth 2 (two) times daily.   spironolactone 25 MG tablet Commonly known as:  ALDACTONE Take 1 tablet (25 mg total) by mouth 2 (two) times daily.   torsemide 20 MG tablet Commonly known as:  DEMADEX Take 2 tablets (40 mg total) by mouth daily.         Objective:   Physical Exam Constitutional:      Appearance: Normal appearance.  Cardiovascular:     Rate and Rhythm: Normal rate and regular rhythm.  Pulmonary:     Effort: Pulmonary effort is normal.     Breath sounds: Normal breath sounds.  Neurological:     Mental Status: He is alert and oriented to person, place, and time.    BP (!) 150/95   Pulse 63   Wt 247 lb 9.6 oz (112.3 kg)   BMI 33.58 kg/m     Assessment & Plan:  1. Essential hypertension  Recheck in April at next appointment.    Potassium became elevated related to increased Aldactone dose  2. Type 2 diabetes mellitus without complication, with long-term current use of insulin (HCC)  Change in medication therapy  due to suspected GI complaints of cramping and loose stools.  -Reduce Metformin from 1500mg  daily to 1000mg  daily. One 500 mg tablet at breakfast and one 500 mg tablet at dinner.  -Increase Levemir insulin from 20 units to 24 units daily at bedtime.   Order Today:  - metFORMIN (GLUCOPHAGE) 500 MG tablet; Take 1 tablet (500mg ) with breakfast and 1 tablet (500mg ) at dinner.  Dispense: 90 tablet; Refill: 3 - insulin detemir (LEVEMIR) 100 UNIT/ML injection; Inject 0.24 mLs (24 Units total) into the skin at bedtime.  Dispense: 10 mL; Refill: 3   RTE in April for scheduled routine follow up and labs.    **Previous weight appears to be recorded incorrectly in [facility] note on 08/05/2018.**

## 2018-10-02 IMAGING — CR DG LUMBAR SPINE 2-3V
1 series · 3 of 3 positions shown · non-contrast
Comparison: CT 12/06/2016 .

CLINICAL DATA: Low back pain radiating down both legs. No known
injury.

EXAM:
LUMBAR SPINE - 2-3 VIEW

[Series 1: dg lumbar spine 2-3 views · 0.14mm/px · 3 of 3 slices shown]
[im 1/3]
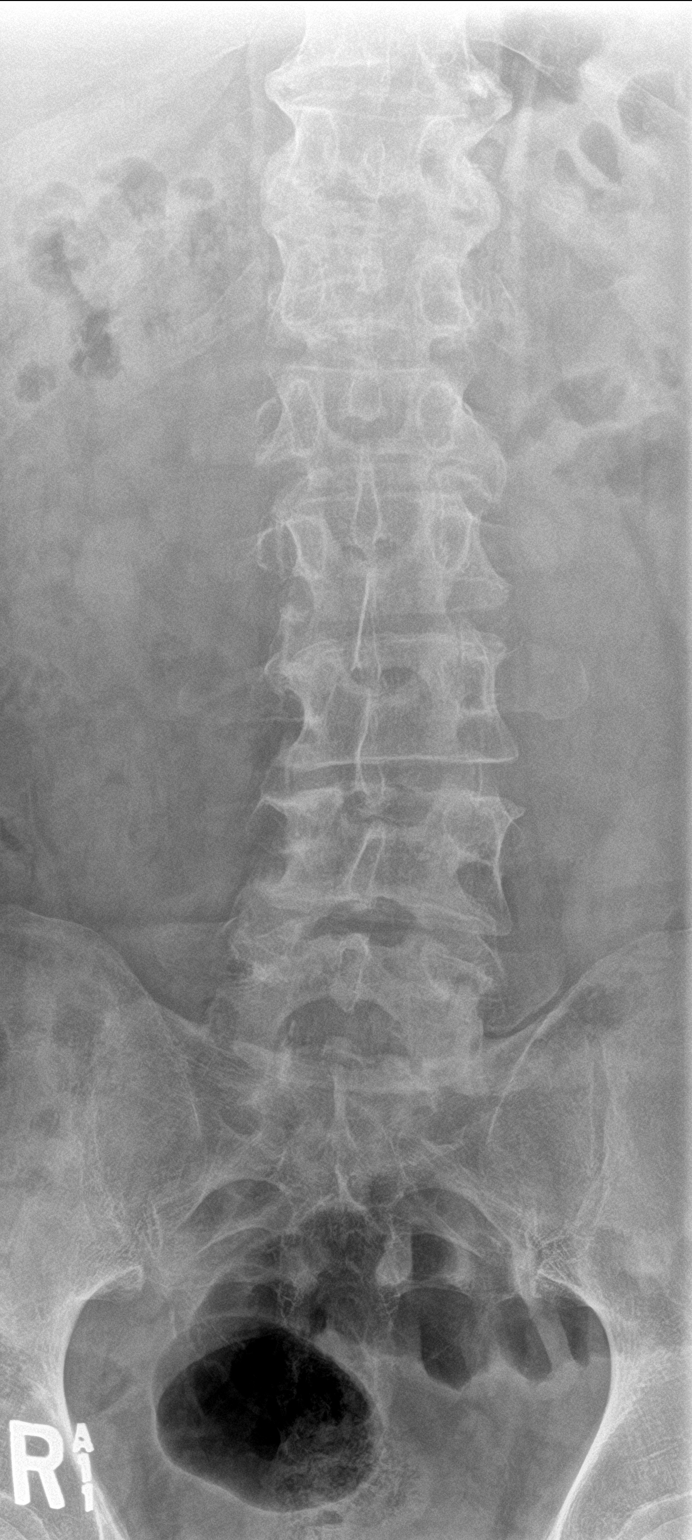
[im 2/3]
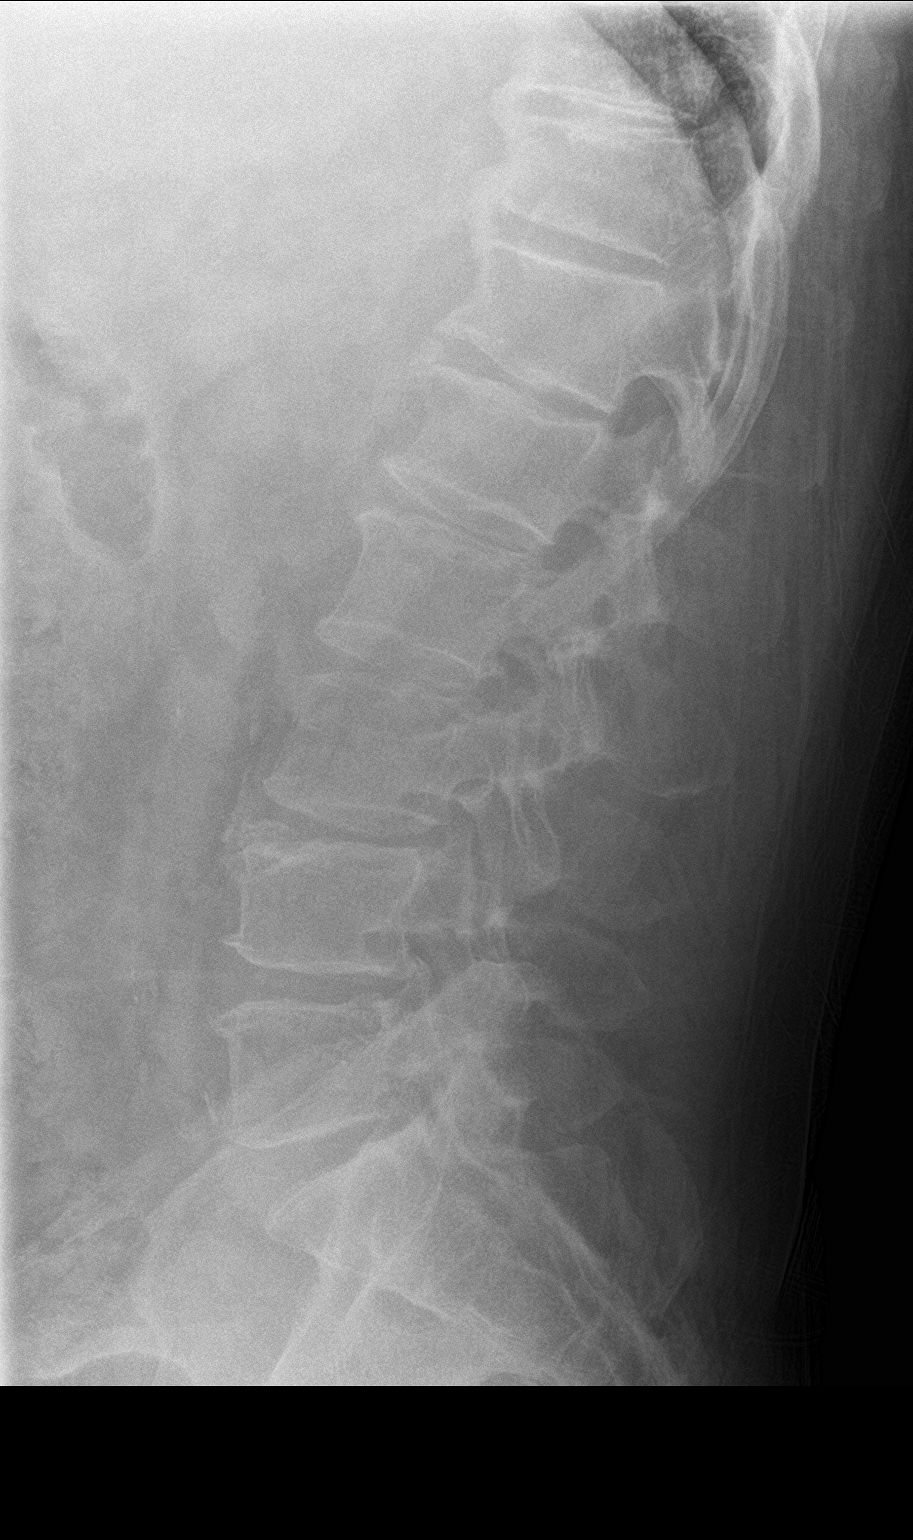
[im 3/3]
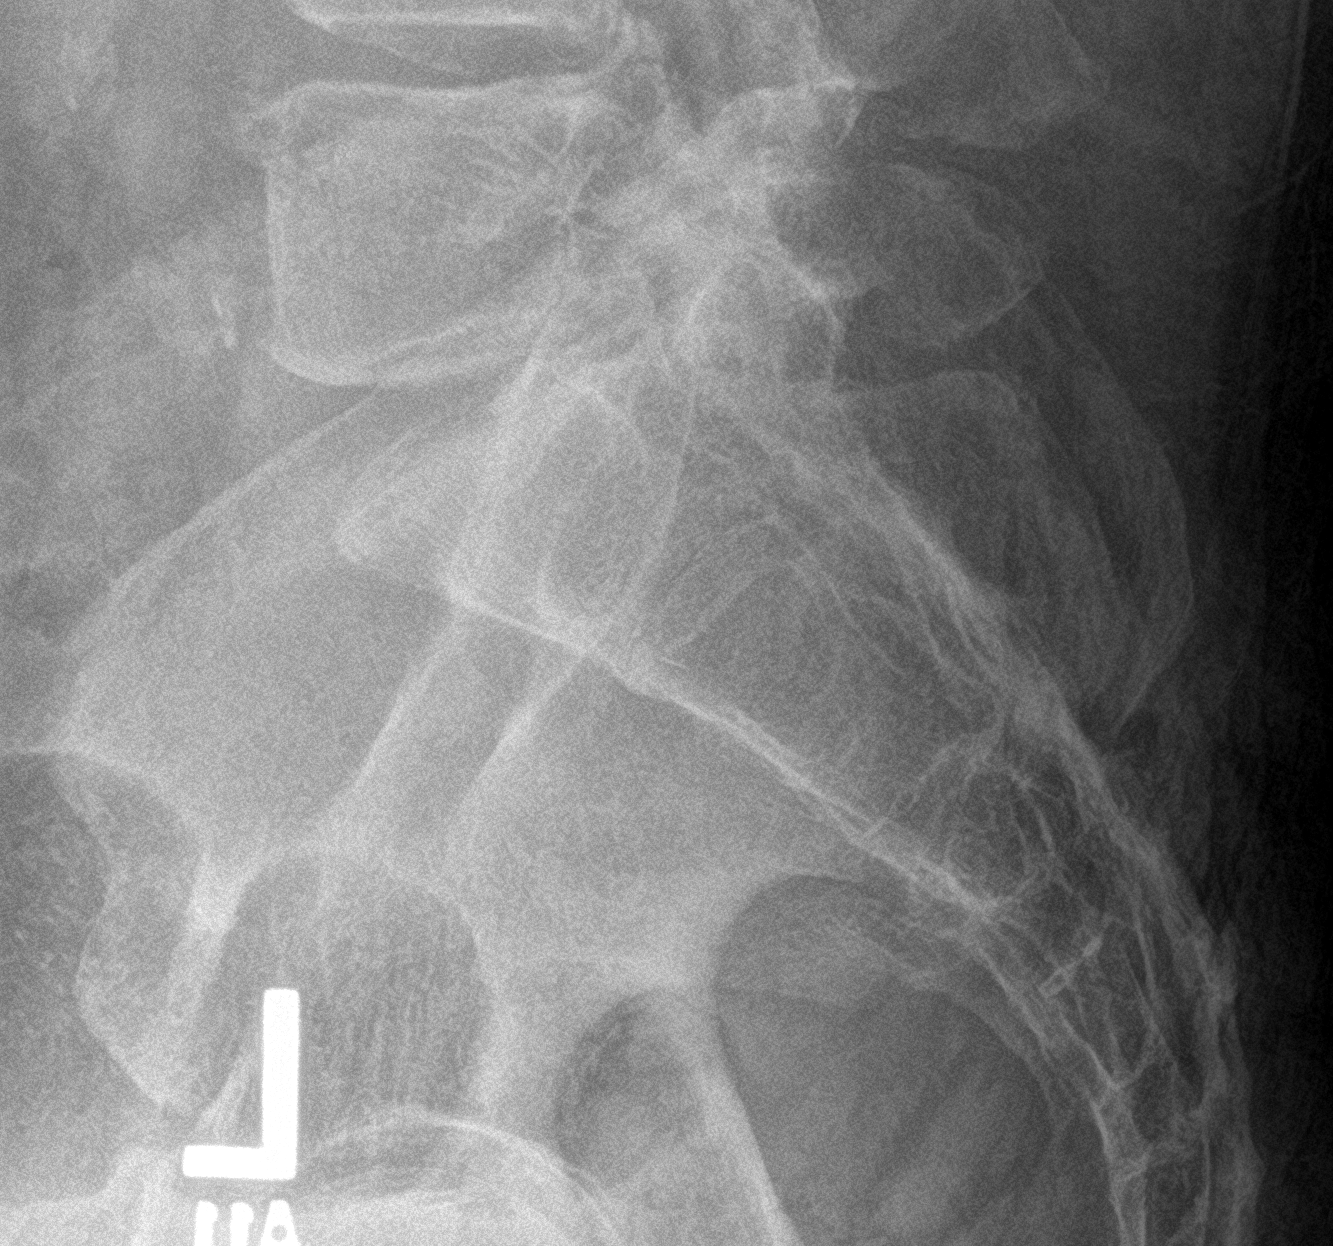

[3 of 3 positions shown; findings below may reference images not displayed]

FINDINGS: Mild lumbar spine scoliosis. Diffuse degenerative change. No acute
bony abnormality identified. Aortoiliac atherosclerotic vascular
disease.
IMPRESSION: 1. Mild lumbar spine scoliosis. Diffuse degenerative change. No
acute bony abnormality identified .

2. Aortoiliac atherosclerotic vascular disease.

## 2018-10-06 ENCOUNTER — Ambulatory Visit: Payer: Self-pay | Admitting: Cardiovascular Disease

## 2018-10-19 ENCOUNTER — Telehealth: Payer: Self-pay | Admitting: Pharmacist

## 2018-10-19 NOTE — Telephone Encounter (Signed)
10/19/2018 10:05:09 AM - [name] call to [employer] & patient  10/19/2018 I have received a note from [place] to call and check on patient's Entresto 97/103. Called [employer] spoke with Webb Silversmith, she transferred me to the pharmacy[facility]-explained that they called patient 09/21/18 to set up delivery and line was busy. I explained that it was marked to ship to provider, she reviewed and did see that information, she will expediate and we should receive tomorrow 10/20/2018 and to my attn. I then called patient and he stated family member in [place], his father needs to come with him to sign a form that needs to be notarized. He will be by on Thurs or Friday, I will let Velva Harman know this information.[name]

## 2018-10-20 ENCOUNTER — Telehealth: Payer: Self-pay | Admitting: Family

## 2018-10-20 IMAGING — DX DG CHEST 1V PORT
1 series · 2 of 2 positions shown · non-contrast
Comparison: Radiographs January 28, 2017.

CLINICAL DATA: Weakness.

EXAM:
PORTABLE CHEST 1 VIEW

[Series 1: chest ap · 0.14mm/px · 2 of 2 slices shown]
[im 1/2]
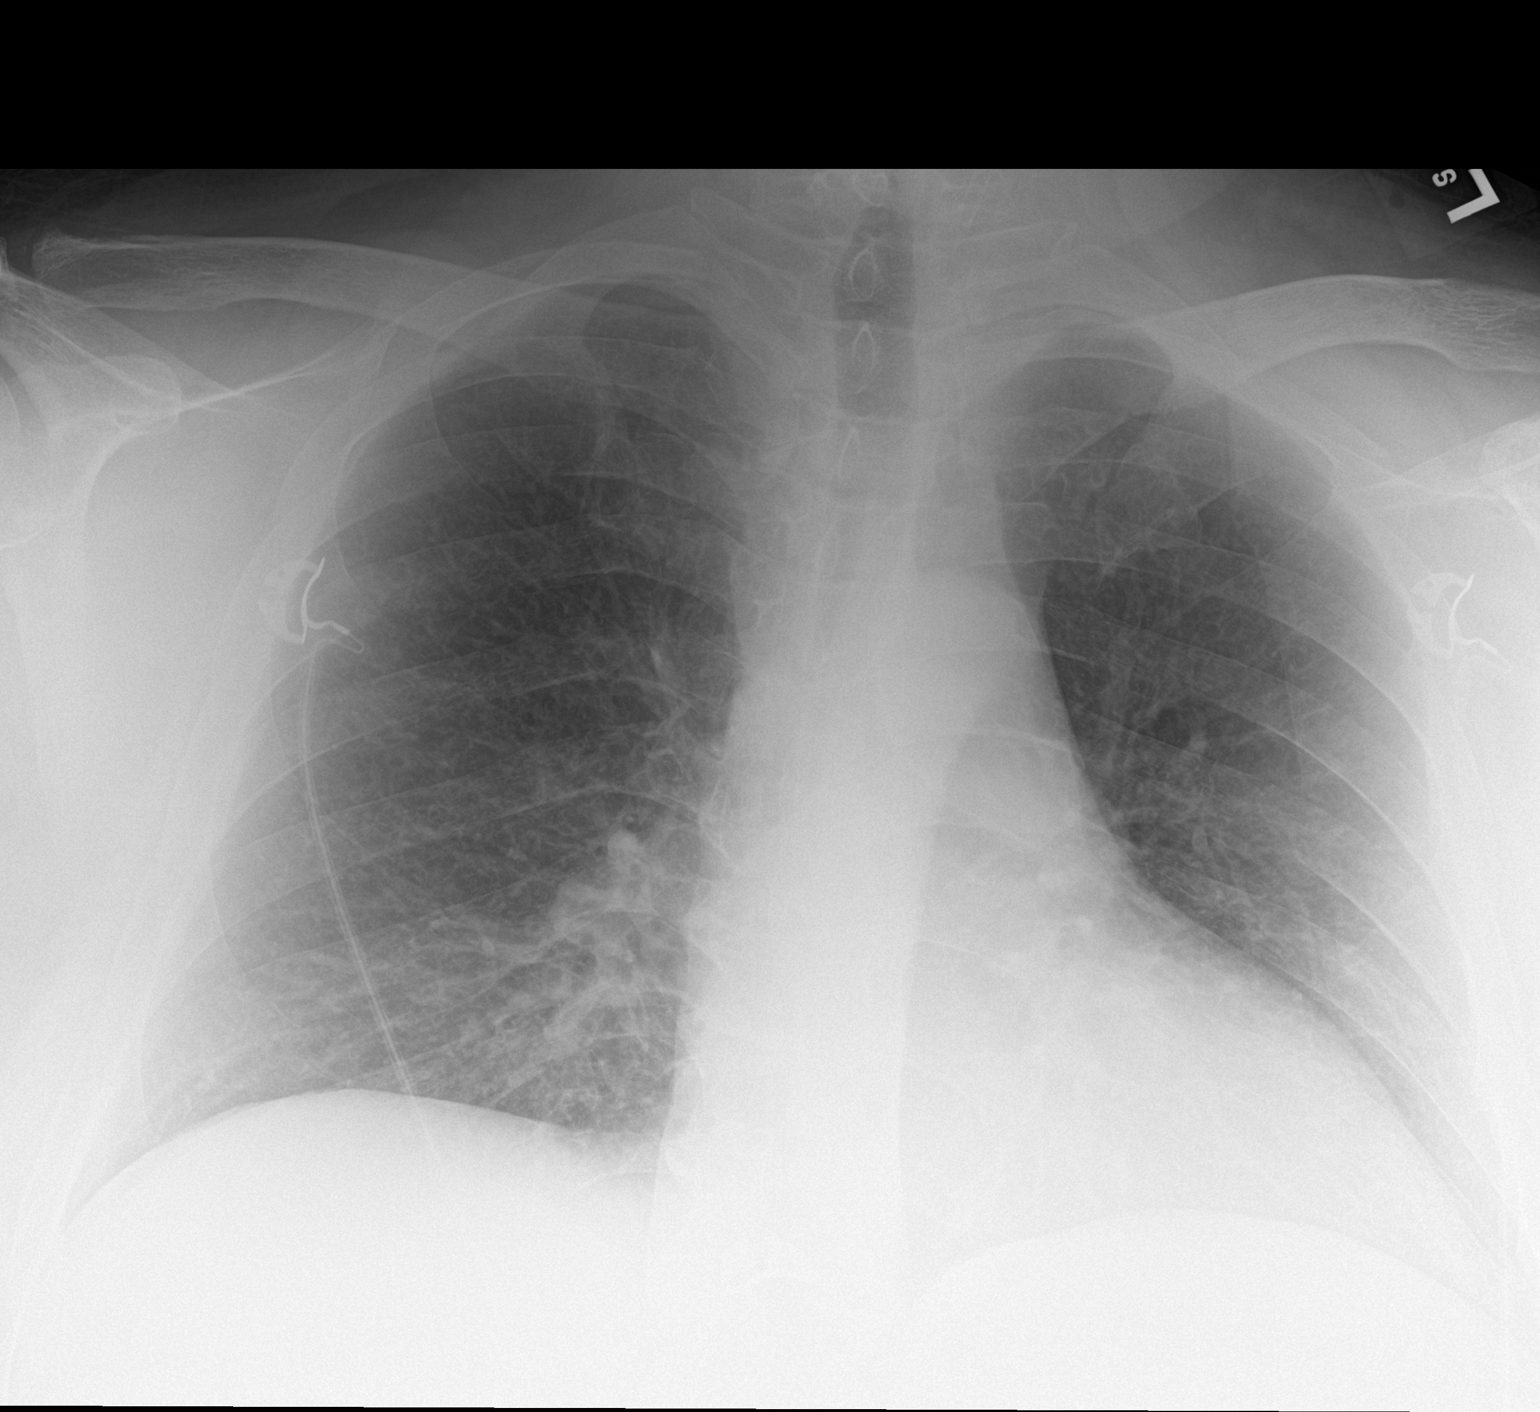
[im 2/2]
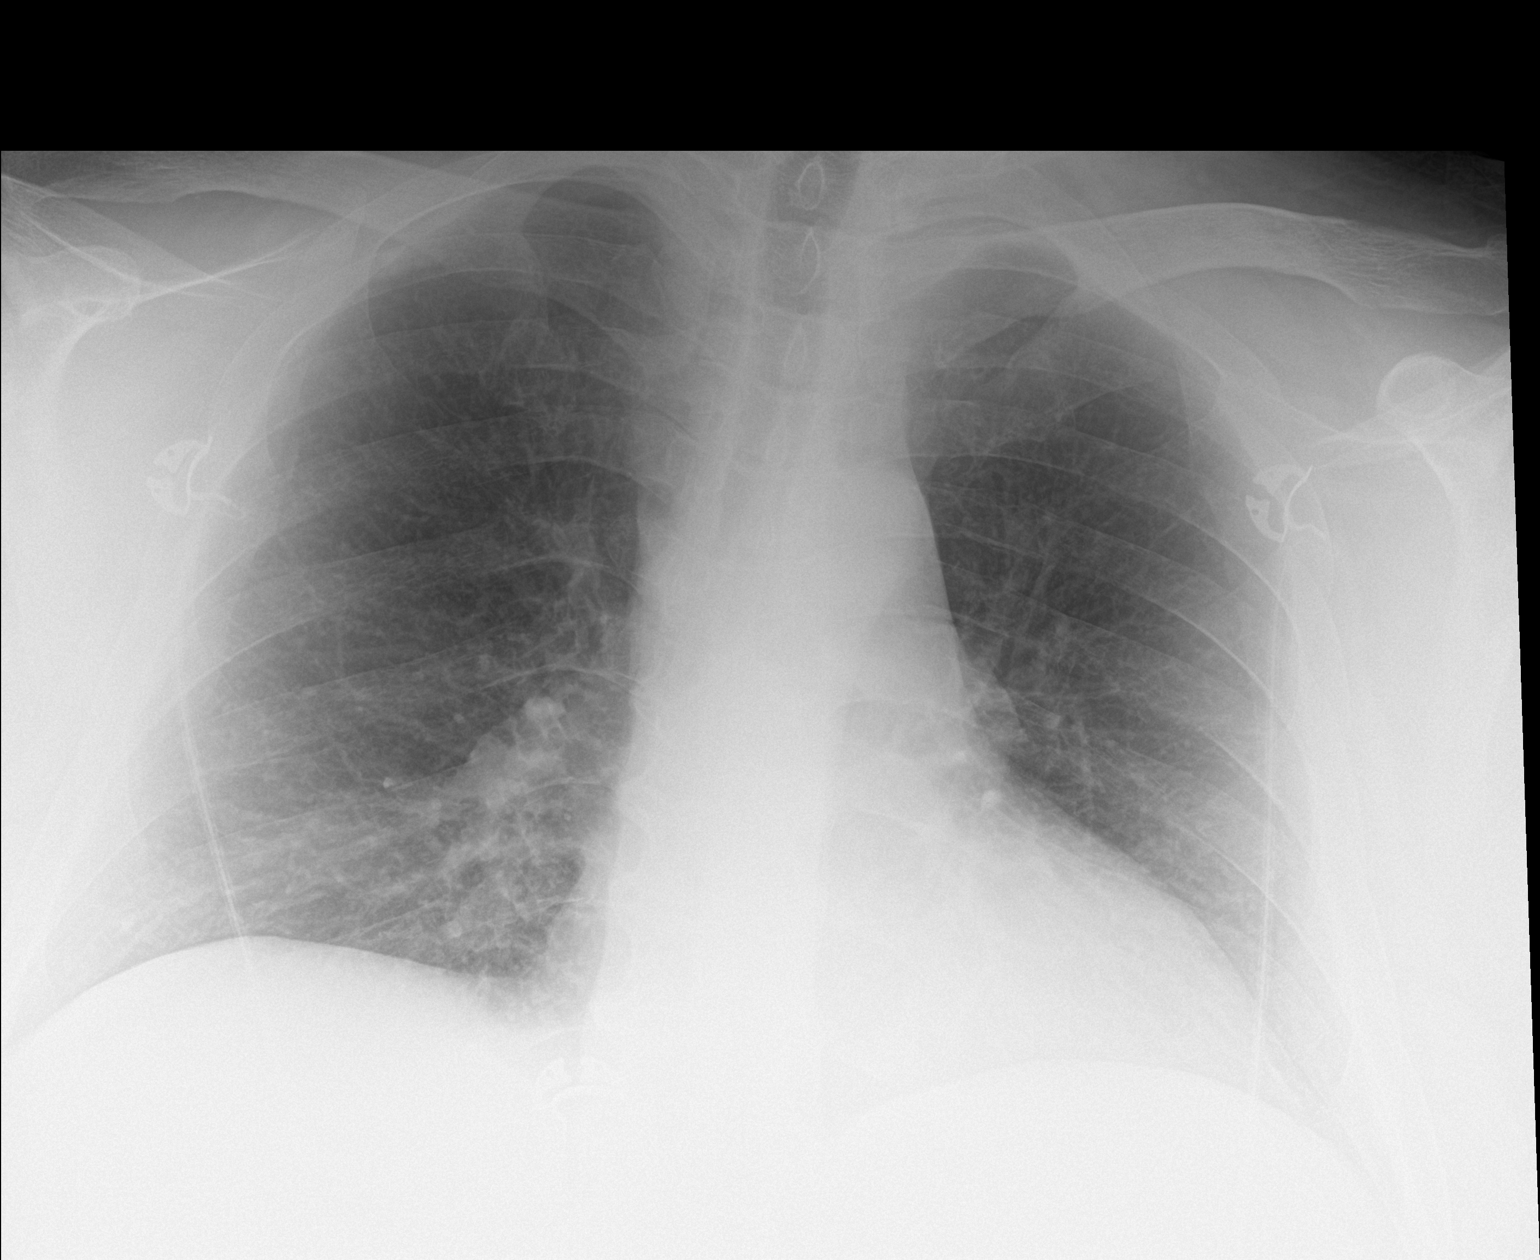

[2 of 2 positions shown; findings below may reference images not displayed]

FINDINGS: The heart size and mediastinal contours are within normal limits.
Both lungs are clear. No pneumothorax or pleural effusion is noted.
The visualized skeletal structures are unremarkable.
IMPRESSION: No acute cardiopulmonary abnormality seen.

## 2018-10-20 NOTE — Telephone Encounter (Signed)
Patient called saying that his back was "killing him" and he's been taking tylenol but without any relief. He says that his PCP at [facility] won't prescribe him any narcotics and he knows that he shouldn't take ibuprofen because of his heart and he doesn't want to go to the ER for pain medication.   Explained that I couldn't prescribe anything for pain that it would need to come from his PCP. Advised him to ask his PCP if he would be willing to try tramadol for pain as well as see if they could refer him to a pain management clinic if that was an option.   Patient appreciated the information.

## 2018-10-23 ENCOUNTER — Telehealth: Payer: Self-pay | Admitting: Cardiovascular Disease

## 2018-10-23 NOTE — Telephone Encounter (Signed)
-----   Message from Clarisse Gouge sent at 10/23/2018  1:30 PM EST ----- Unable to contact.  No ans no vm   ----- Message ----- From: Britt Bottom, CMA Sent: 10/22/2018   9:39 AM EST To: [facility] Scheduling  Pt has f/u appointment with [facility] 11/04/2018. Pt is not due for 12 month f/u until 12/2018 just want to make sure if it's ok for pt to keep appointment 2 months early.  Thank you, Lenda Kelp

## 2018-10-23 NOTE — Progress Notes (Deleted)
Patient ID: Gabriel [name]., male    DOB: 07/29/1971, 48 y.o.   MRN: [id number]  HPI  [name] is a 48 y/o male with a history of HTN, diabetes, COPD, CKD, asthma, previous tobacco use and chronic heart failure.   Most recent echo done 04/29/17 showed EF 45-50%. Echo report that was done 11/28/16 and it showed an EF of 40-45% along with mild MR. EF has improved from 30-35% July 2017.  Has not been admitted or been in the ED in the last 6 months.  He presents today for a follow-up visit with a chief complaint of   Past Medical History:  Diagnosis Date  . Chest wall pain, chronic   . Chronic systolic CHF (congestive heart failure) ([place])    a. 03/2015 Echo: EF 45-50%; b. 12/2015 Echo: EF 20-25%; c. 02/2016 Echo: EF 30-35%; d. 11/2016 Echo: EF 40-45%.  . Chronic Troponin Elevation   . CKD (chronic kidney disease), stage III ([place])   . COPD (chronic obstructive pulmonary disease) ([place])   . Diabetes mellitus without complication ([place])   . Hypertension    Resolved since weight loss  . Myocardial infarction ([place])   . NICM (nonischemic cardiomyopathy) ([place])    a. 03/2015 Echo: EF 45-50%; b. 04/2015 MV: small defect of mild severity in apex 2/2 apical thinning, EF 30-44%;  c. 12/2015 Echo: EF 20-25%; d. 02/2016 Echo: EF 30-35%; e. 11/2016 Echo: EF 40-45%, diff HK, mildly dil Ao root, mild MR, mod dil LA;  f. 12/2016 Cath: LM nl, LAD/Diags/LCX/OMs min irregs, RCA 40p/m/d.  . Non-obstructive CAD (coronary artery disease)    a. 04/2015 low risk MV;  b. 12/2016 Cath: minor irregs in LAD/Diag/LCX/OM, RCA 40p/m/d.  [name] (nonsustained ventricular tachycardia) ([place])    a. 12/2015 noted on tele-->amio;  b. 12/2015 Event monitor: no VT. Atach noted.  . Obesity (BMI 30.0-34.9)   . Psoriasis   . Renal insufficiency   . Syncope    a. 01/2016 - felt to be vasovagal.  . Tobacco abuse    Past Surgical History:  Procedure Laterality Date  . AMPUTATION    . CARDIAC CATHETERIZATION    . FINGER AMPUTATION     Traumatic  . FINGER FRACTURE SURGERY Left   . LEFT HEART CATH AND CORONARY ANGIOGRAPHY N/A 01/06/2017   Procedure: Left Heart Cath and Coronary Angiography;  Surgeon: Wellington Hampshire, MD;  Location: [facility];  Service: Cardiovascular;  Laterality: N/A;   Family History  Problem Relation Age of Onset  . Diabetes Mellitus II Mother   . Hypertension Father   . Cancer Paternal Aunt   . Cancer Maternal Grandfather   . Diabetes Maternal Grandfather    Social History   Tobacco Use  . Smoking status: Former Smoker    Packs/day: 1.00    Years: 33.00    Pack years: 33.00    Types: Cigarettes  . Smokeless tobacco: Never Used  . Tobacco comment: Quit a little over a month ago.  Substance Use Topics  . Alcohol use: No    Alcohol/week: 0.0 standard drinks    Frequency: Never    Comment: occassionally   Allergies  Allergen Reactions  . Prednisone Other (See Comments)    Reaction: Hallucinations    . Zantac [Ranitidine Hcl] Diarrhea and Nausea Only    Night sweats      Review of Systems  Constitutional: Positive for fatigue. Negative for appetite change.  HENT: Positive for congestion. Negative  for postnasal drip and sore throat.   Eyes: Negative.   Respiratory: Positive for shortness of breath (at times). Negative for cough and chest tightness.   Cardiovascular: Positive for chest pain ("just the usual") and palpitations. Negative for leg swelling.  Gastrointestinal: Negative for abdominal distention and abdominal pain.  Endocrine: Negative.   Genitourinary: Negative.   Musculoskeletal: Positive for back pain.  Skin: Negative.   Allergic/Immunologic: Negative.   Neurological: Positive for light-headedness (some days), numbness (down both legs at times) and headaches (constant pressure on top of the head). Negative for dizziness.  Hematological: Negative for adenopathy. Does not bruise/bleed easily.  Psychiatric/Behavioral: Positive for sleep disturbance (due to  pain down his legs). Negative for dysphoric mood and suicidal ideas. The patient is not nervous/anxious.      Physical Exam  Constitutional: He is oriented to person, place, and time. He appears well-developed and well-nourished.  HENT:  Head: Normocephalic and atraumatic.  Neck: Normal range of motion. Neck supple. No JVD present.  Cardiovascular: Normal rate and regular rhythm.  Pulmonary/Chest: Effort normal. He has no wheezes. He has no rales. He exhibits no tenderness.  Abdominal: Soft. He exhibits no distension. There is no abdominal tenderness.  Musculoskeletal:        General: No tenderness or edema.  Neurological: He is alert and oriented to person, place, and time.  Skin: Skin is warm and dry.  Psychiatric: He has a normal mood and affect. His behavior is normal. Thought content normal.  Nursing note and vitals reviewed.   Assessment & Plan:  1: Chronic heart failure with reduced ejection fraction- - NYHA class II - euvolemic today - weighing daily and reminded to call for an overnight weight gain of >2 pounds or a weekly weight gain of >5 pounds.  - weight  - not adding salt and he was encouraged to closely follow a 2000mg  sodium diet - has not received his flu vaccine yet for this season - saw cardiologist Rockey Situ) 01/05/18  - BNP from 04/28/17 was 25.0 - patient develops angina upon exertion and, therefore, is unable to work  2: HTN- - BP  - metoprolol succinate increased to 200mg  daily at last visit - discussed possibly adding amlodipine or hydralazine to regimen  - saw PCP @ [facility] 08/05/18  - BMP from 09/09/2018 reviewed and showed sodium 141, potassium 5.4, creatinine 1.24 and GFR 68  3: Diabetes-   -  A1c 8.0% on 09/02/2018 - says glucose at home has been running  - followed by [facility] - completed some classes at the [facility]  Patient did not bring his medications nor a list. Each medication was verbally reviewed with the patient  and he was encouraged to bring the bottles to every visit to confirm accuracy of list.

## 2018-10-26 ENCOUNTER — Telehealth: Payer: Self-pay | Admitting: Pharmacy Technician

## 2018-10-26 NOTE — Telephone Encounter (Signed)
Received 2020 proof of income.  Patient eligible to receive medication assistance at [facility] as long as eligibility requirements continue to be met.  [facility]

## 2018-10-27 ENCOUNTER — Ambulatory Visit: Payer: Medicaid Other | Admitting: Family

## 2018-10-27 NOTE — Telephone Encounter (Signed)
No ans no vm   °

## 2018-11-03 ENCOUNTER — Telehealth: Payer: Self-pay | Admitting: *Deleted

## 2018-11-03 NOTE — Telephone Encounter (Signed)
COVID-19 Pre-Screening:  1. Have you been in contact with someone who was sick?  NO 2. Do you have any of the following symptoms (cough, fever, muscle pain, vomiting, diarrhea, weakness abdominal pain, rash, red eye, bruising or bleeding, joint pain, severe headache)?  NO 3. Have you travelled internationally or out of state in the last month?  NO   4. Do you need any refills at this time?  NO  Patient aware of the following: You will be contacted at a later time to reschedule. However, this will depend on ongoing evaluation of the Covid-19 situation.  Please call us if any new questions or concerns arise. We are here for advice.  Appointment scheduled.   Routing to COVID cancel pool.

## 2018-11-04 ENCOUNTER — Ambulatory Visit: Payer: Self-pay | Admitting: Cardiovascular Disease

## 2018-11-10 NOTE — Telephone Encounter (Signed)
Patient declined e visit will wait of in office face to face

## 2018-11-16 IMAGING — US US CAROTID DUPLEX BILAT
1 series · 13 of 24 positions shown · non-contrast
Comparison: 01/23/2016.

CLINICAL DATA: Syncope.

EXAM:
BILATERAL CAROTID DUPLEX ULTRASOUND
TECHNIQUE: Gray scale imaging, color Doppler and duplex ultrasound were
performed of bilateral carotid and vertebral arteries in the neck.

[Series 1: us carotid duplex bilat · 0.08mm/px · 13 of 69 slices shown]
[im 1/69]
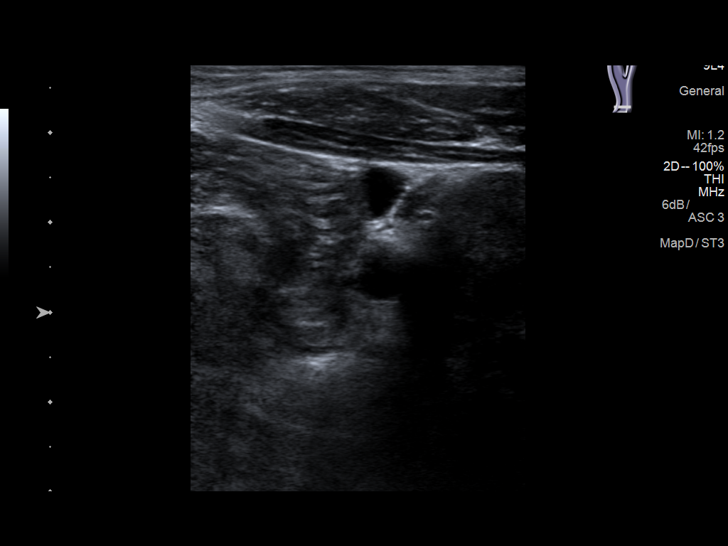
[im 6/69]
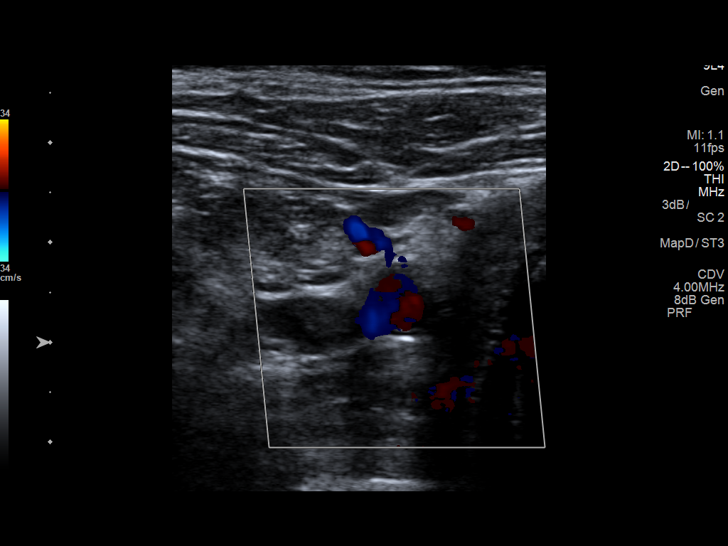
[im 12/69]
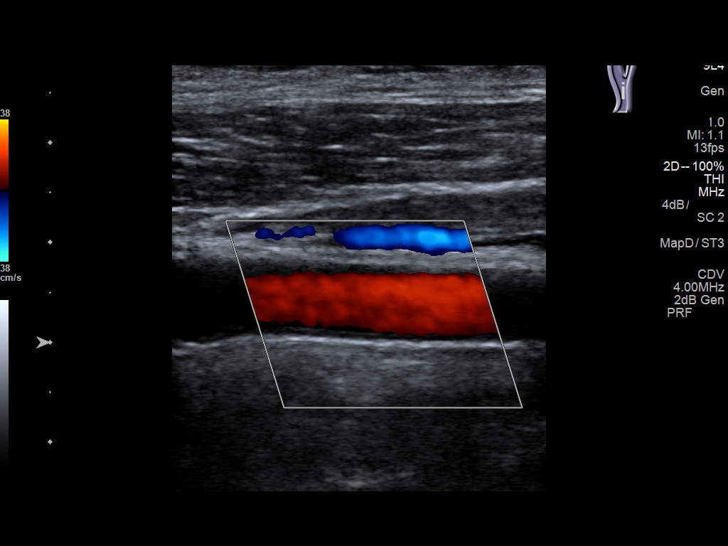
[im 18/69]
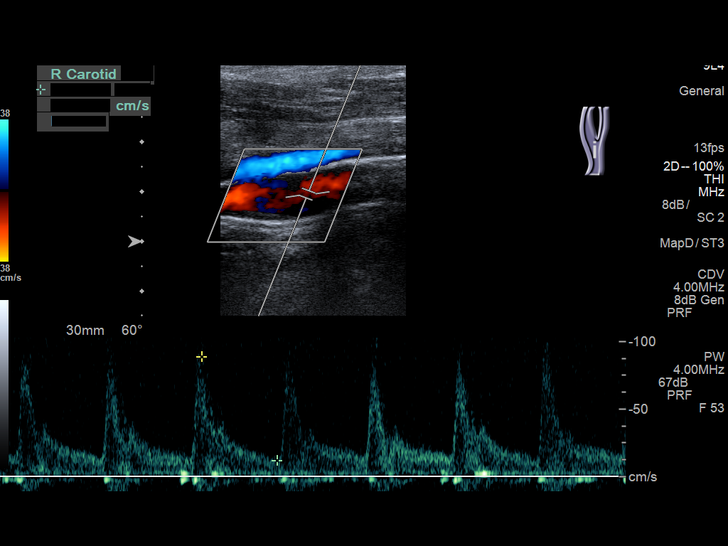
[im 24/69]
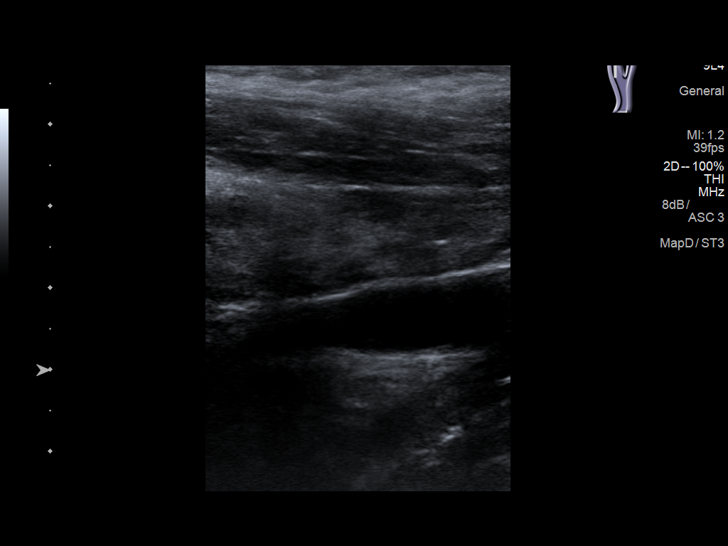
[im 30/69]
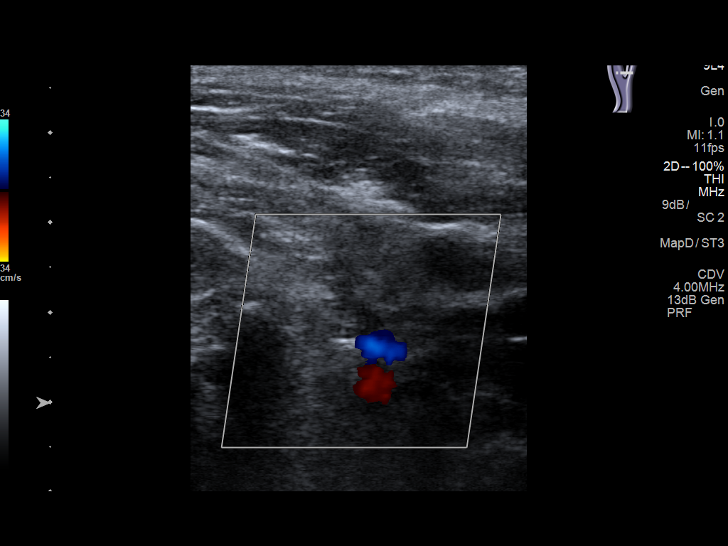
[im 36/69]
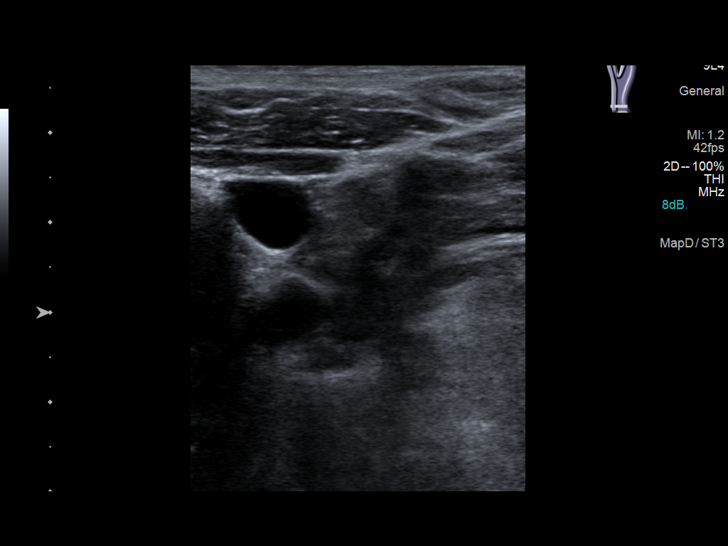
[im 39/69]
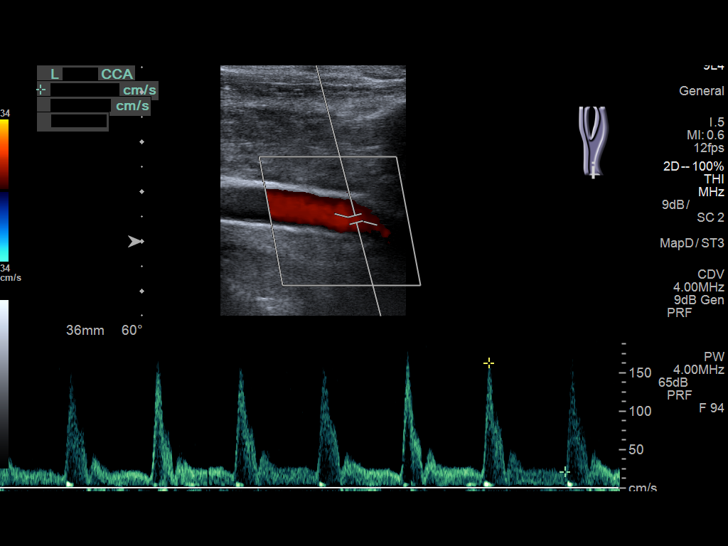
[im 45/69]
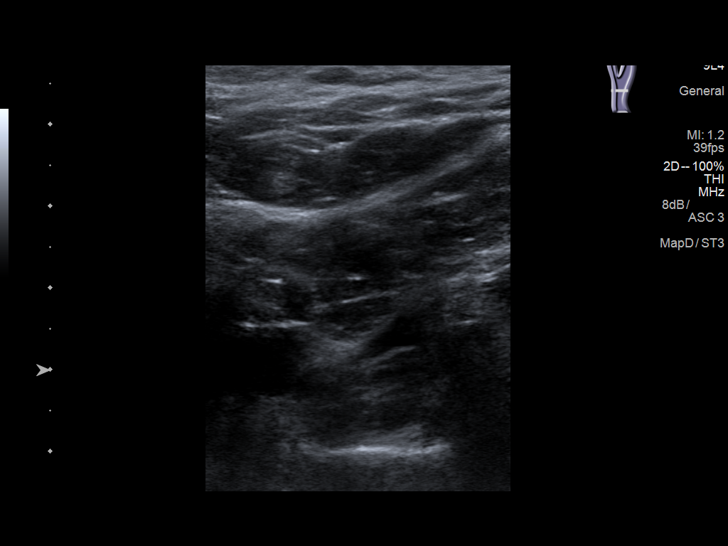
[im 51/69]
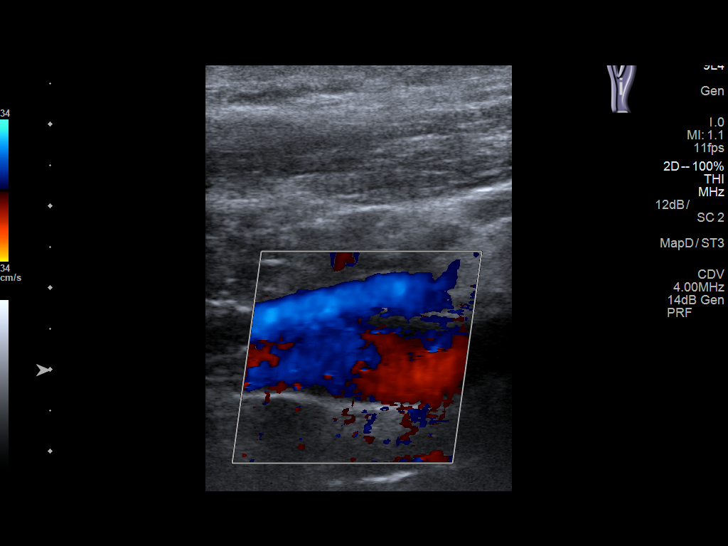
[im 57/69]
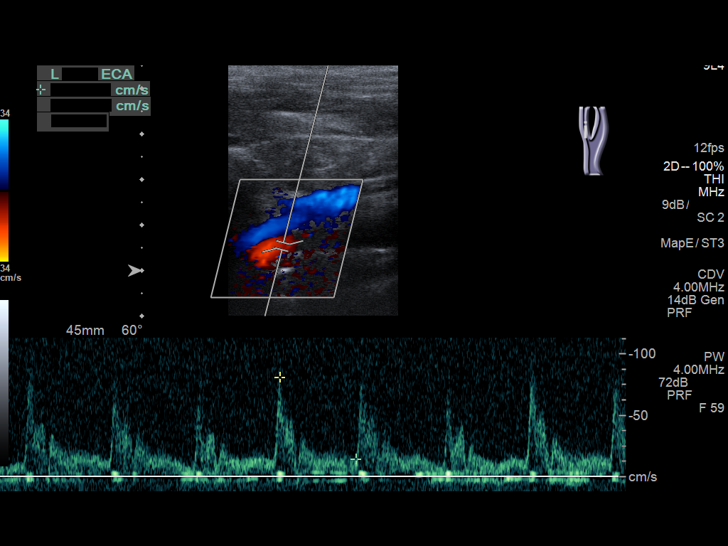
[im 63/69]
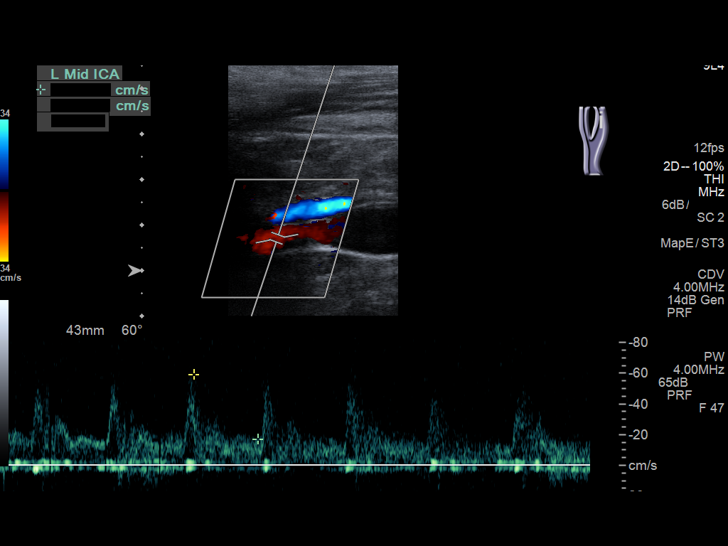
[im 69/69]
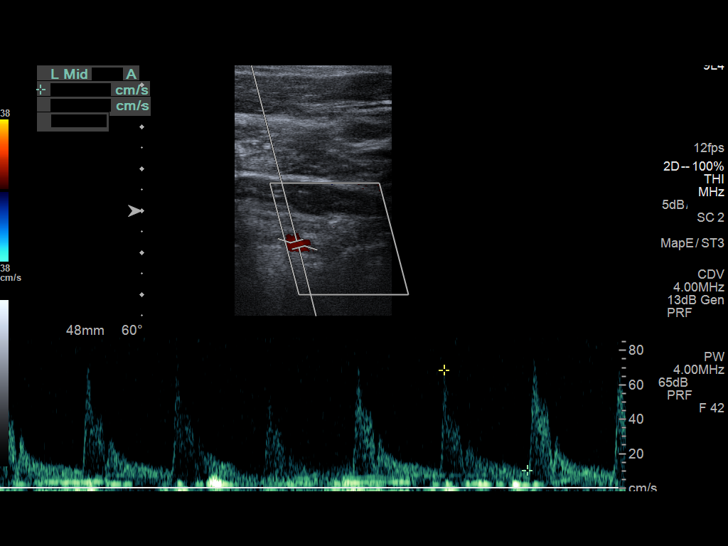

[13 of 24 positions shown; findings below may reference images not displayed]

FINDINGS: Criteria: Quantification of carotid stenosis is based on velocity
parameters that correlate the residual internal carotid diameter
with NASCET-based stenosis levels, using the diameter of the distal
internal carotid lumen as the denominator for stenosis measurement.

The following velocity measurements were obtained:

RIGHT

ICA:  74/13 cm/sec

CCA:  88/15 cm/sec

SYSTOLIC ICA/CCA RATIO:

DIASTOLIC ICA/CCA RATIO:

ECA:  137 cm/sec

LEFT

ICA:  72/19 cm/sec

CCA:  111/15 cm/sec

SYSTOLIC ICA/CCA RATIO:

DIASTOLIC ICA/CCA RATIO:

ECA:  81 cm/sec

RIGHT CAROTID ARTERY: Mild right carotid bifurcation atherosclerotic
vascular plaque. No flow limiting stenosis.

RIGHT VERTEBRAL ARTERY:  Patent with antegrade flow.

LEFT CAROTID ARTERY:  No significant abnormality identified.

LEFT VERTEBRAL ARTERY:  Patent with antegrade flow.
IMPRESSION: 1. Mild right carotid bifurcation atherosclerotic vascular plaque.
No flow limiting stenosis. Degree of stenosis less than 50%.

2. Left carotid widely patent. No significant atherosclerotic
vascular disease.

3.  Vertebral artery is patent with antegrade flow .

## 2018-11-19 ENCOUNTER — Emergency Department
Admission: EM | Admit: 2018-11-19 | Discharge: 2018-11-19 | Disposition: A | Discharge status: Home / Self Care | Payer: Medicaid Other | Attending: Emergency Medicine | Admitting: Emergency Medicine

## 2018-11-19 ENCOUNTER — Encounter: Payer: Self-pay | Admitting: Emergency Medicine

## 2018-11-19 ENCOUNTER — Emergency Department: Payer: Medicaid Other

## 2018-11-19 ENCOUNTER — Other Ambulatory Visit: Payer: Self-pay

## 2018-11-19 ENCOUNTER — Ambulatory Visit: Payer: Medicaid Other | Admitting: Pharmacist

## 2018-11-19 DIAGNOSIS — I110 Hypertensive heart disease with heart failure: Secondary | ICD-10-CM | POA: Insufficient documentation

## 2018-11-19 DIAGNOSIS — I5022 Chronic systolic (congestive) heart failure: Secondary | ICD-10-CM | POA: Insufficient documentation

## 2018-11-19 DIAGNOSIS — E1122 Type 2 diabetes mellitus with diabetic chronic kidney disease: Secondary | ICD-10-CM | POA: Insufficient documentation

## 2018-11-19 DIAGNOSIS — R0789 Other chest pain: Secondary | ICD-10-CM

## 2018-11-19 DIAGNOSIS — I130 Hypertensive heart and chronic kidney disease with heart failure and stage 1 through stage 4 chronic kidney disease, or unspecified chronic kidney disease: Secondary | ICD-10-CM | POA: Insufficient documentation

## 2018-11-19 DIAGNOSIS — N183 Chronic kidney disease, stage 3 (moderate): Secondary | ICD-10-CM | POA: Insufficient documentation

## 2018-11-19 DIAGNOSIS — I2510 Atherosclerotic heart disease of native coronary artery without angina pectoris: Secondary | ICD-10-CM | POA: Insufficient documentation

## 2018-11-19 DIAGNOSIS — J40 Bronchitis, not specified as acute or chronic: Secondary | ICD-10-CM

## 2018-11-19 DIAGNOSIS — Z7982 Long term (current) use of aspirin: Secondary | ICD-10-CM | POA: Insufficient documentation

## 2018-11-19 DIAGNOSIS — Z79899 Other long term (current) drug therapy: Secondary | ICD-10-CM

## 2018-11-19 DIAGNOSIS — Z87891 Personal history of nicotine dependence: Secondary | ICD-10-CM | POA: Insufficient documentation

## 2018-11-19 DIAGNOSIS — J449 Chronic obstructive pulmonary disease, unspecified: Secondary | ICD-10-CM | POA: Insufficient documentation

## 2018-11-19 DIAGNOSIS — Z794 Long term (current) use of insulin: Secondary | ICD-10-CM | POA: Insufficient documentation

## 2018-11-19 LAB — COMPREHENSIVE METABOLIC PANEL
ALT: 46 U/L — ABNORMAL HIGH (ref 0–44)
AST: 27 U/L (ref 15–41)
Albumin: 4.6 g/dL (ref 3.5–5.0)
Alkaline Phosphatase: 85 U/L (ref 38–126)
Anion gap: 13 (ref 5–15)
BUN: 22 mg/dL — ABNORMAL HIGH (ref 6–20)
CO2: 24 mmol/L (ref 22–32)
Calcium: 10.1 mg/dL (ref 8.9–10.3)
Chloride: 101 mmol/L (ref 98–111)
Creatinine, Ser: 1.22 mg/dL (ref 0.61–1.24)
GFR calc Af Amer: >60 mL/min (ref >60)
GFR calc non Af Amer: >60 mL/min (ref >60)
Glucose, Bld: 287 mg/dL — ABNORMAL HIGH (ref 70–99)
Potassium: 3.7 mmol/L (ref 3.5–5.1)
Sodium: 138 mmol/L (ref 135–145)
Total Bilirubin: 1.5 mg/dL — ABNORMAL HIGH (ref 0.3–1.2)
Total Protein: 8.7 g/dL — ABNORMAL HIGH (ref 6.5–8.1)

## 2018-11-19 LAB — CBC WITH DIFFERENTIAL/PLATELET
Abs Immature Granulocytes: 0.08 10*3/uL — ABNORMAL HIGH (ref 0.00–0.07)
Basophils Absolute: 0.1 10*3/uL (ref 0.0–0.1)
Basophils Relative: 1 %
Eosinophils Absolute: 0.2 10*3/uL (ref 0.0–0.5)
Eosinophils Relative: 2 %
HCT: 55.8 % — ABNORMAL HIGH (ref 39.0–52.0)
Hemoglobin: 19 g/dL — ABNORMAL HIGH (ref 13.0–17.0)
Immature Granulocytes: 1 %
Lymphocytes Relative: 31 %
Lymphs Abs: 3.1 10*3/uL (ref 0.7–4.0)
MCH: 28.7 pg (ref 26.0–34.0)
MCHC: 34.1 g/dL (ref 30.0–36.0)
MCV: 84.2 fL (ref 80.0–100.0)
Monocytes Absolute: 0.7 10*3/uL (ref 0.1–1.0)
Monocytes Relative: 7 %
Neutro Abs: 6 10*3/uL (ref 1.7–7.7)
Neutrophils Relative %: 58 %
Platelets: 237 10*3/uL (ref 150–400)
RBC: 6.63 MIL/uL — ABNORMAL HIGH (ref 4.22–5.81)
RDW: 14 % (ref 11.5–15.5)
WBC: 10.1 10*3/uL (ref 4.0–10.5)
nRBC: 0 % (ref 0.0–0.2)

## 2018-11-19 LAB — TROPONIN I: Troponin I: 0.05 ng/mL (ref <0.03)

## 2018-11-19 MED ORDER — AZITHROMYCIN 250 MG PO TABS: ORAL_TABLET | ORAL | 0 refills | Status: AC

## 2018-11-19 NOTE — Progress Notes (Signed)
Medication Management Clinic Visit Note  Patient: Gabriel [name]. MRN: [id number] Date of Birth: Jun 13, 1971 PCP: Alisa Graff, FNP   [name] 48 y.o. male presents for a Medication Management visit today.  There were no vitals taken for this visit.  Patient Information   Past Medical History:  Diagnosis Date  . Chest wall pain, chronic   . Chronic systolic CHF (congestive heart failure) ([place])    a. 03/2015 Echo: EF 45-50%; b. 12/2015 Echo: EF 20-25%; c. 02/2016 Echo: EF 30-35%; d. 11/2016 Echo: EF 40-45%.  . Chronic Troponin Elevation   . CKD (chronic kidney disease), stage III ([place])   . COPD (chronic obstructive pulmonary disease) ([place])   . Diabetes mellitus without complication ([place])   . Hypertension    Resolved since weight loss  . Myocardial infarction ([place])   . NICM (nonischemic cardiomyopathy) ([place])    a. 03/2015 Echo: EF 45-50%; b. 04/2015 MV: small defect of mild severity in apex 2/2 apical thinning, EF 30-44%;  c. 12/2015 Echo: EF 20-25%; d. 02/2016 Echo: EF 30-35%; e. 11/2016 Echo: EF 40-45%, diff HK, mildly dil Ao root, mild MR, mod dil LA;  f. 12/2016 Cath: LM nl, LAD/Diags/LCX/OMs min irregs, RCA 40p/m/d.  . Non-obstructive CAD (coronary artery disease)    a. 04/2015 low risk MV;  b. 12/2016 Cath: minor irregs in LAD/Diag/LCX/OM, RCA 40p/m/d.  [name] (nonsustained ventricular tachycardia) ([place])    a. 12/2015 noted on tele-->amio;  b. 12/2015 Event monitor: no VT. Atach noted.  . Obesity (BMI 30.0-34.9)   . Psoriasis   . Renal insufficiency   . Syncope    a. 01/2016 - felt to be vasovagal.  . Tobacco abuse       Past Surgical History:  Procedure Laterality Date  . AMPUTATION    . CARDIAC CATHETERIZATION    . FINGER AMPUTATION     Traumatic  . FINGER FRACTURE SURGERY Left   . LEFT HEART CATH AND CORONARY ANGIOGRAPHY N/A 01/06/2017   Procedure: Left Heart Cath and Coronary Angiography;  Surgeon: Wellington Hampshire, MD;  Location: [facility];   Service: Cardiovascular;  Laterality: N/A;     Family History  Problem Relation Age of Onset  . Diabetes Mellitus II Mother   . Hypertension Father   . Cancer Paternal Aunt   . Cancer Maternal Grandfather   . Diabetes Maternal Grandfather     New Diagnoses (since last visit):              Social History   Substance and Sexual Activity  Alcohol Use No  . Alcohol/week: 0.0 standard drinks  . Frequency: Never   Comment: occassionally      Social History   Tobacco Use  Smoking Status Former Smoker  . Packs/day: 1.00  . Years: 33.00  . Pack years: 33.00  . Types: Cigarettes  Smokeless Tobacco Never Used  Tobacco Comment   Quit a little over a month ago.      Health Maintenance  Topic Date Due  . FOOT EXAM  01/31/1979  . OPHTHALMOLOGY EXAM  01/31/1979  . URINE MICROALBUMIN  01/31/1979  . TETANUS/TDAP  01/31/1988  . HEMOGLOBIN A1C  03/03/2019  . INFLUENZA VACCINE  03/20/2019  . PNEUMOCOCCAL POLYSACCHARIDE VACCINE AGE 59-64 HIGH RISK  Completed  . HIV Screening  Completed     Assessment and Plan: 1. Diabetes (levemir, metformin): is only  Checking his blood sugars 1-2x/week. Encouraged him to increase this at least  for fasting BG readings for medication titration. Was unaware of treatment for hypoglycemia; however, has had episodes of this. Counseled patient on this and again, encouraged him to check BG more frequently. BG on occasions he does check are high 100s/low 200s. Is aware of BG goals, and recognizes he is above the recommended <130 mg/dL per ADA.   2. CHF (torsemide, metoprolol, entresto, spironolactone): states his weight usually only fluctuates ~1lb, staying steady at 245lbs. Encouraged him to contact PCP if weight increases 3lbs or more in a day or 5lbs or more in a week. Counseled on sodium intake.   Patient feels he is developing pneumonia and is having severe back pain with shooting down his legs. Sees PCP at [facility], [place] there to make them  aware. Patient states he is compliant with medications. Visit was limited by telephone encounter.  Paticia Stack, PharmD Pharmacy Resident  11/19/2018 10:30 AM

## 2018-11-19 NOTE — ED Notes (Signed)
Date and time results received: 11/19/18 1414 (use smartphrase ".now" to insert current time)  Test: troponin Critical Value: 0.05  Name of Provider Notified: Dr. Kerman Passey  Orders Received? Or Actions Taken?: Orders Received - See Orders for details

## 2018-11-19 NOTE — Telephone Encounter (Signed)
Pt complains of sharp knife like pain in L side worsening since Monday. Causing difficulty breathing and shortness of breath.  Spoke with Benjamine Mola, NP, recommends he go to ER for evaluation.  Patient has not spoken with cardiologist.  Patient states he will go to ER once he is able to find a ride.

## 2018-11-19 NOTE — ED Triage Notes (Signed)
Pt reports intermittent sharp chest pain to left side since Tuesday. Pt reports some SOB as well. Pt states was seen at [facility] and advised to come to the ED for pneumonia.

## 2018-11-19 NOTE — ED Provider Notes (Signed)
[facility] Emergency Department Provider Note  Time seen: 1:27 PM  I have reviewed the triage vital signs and the nursing notes.   HISTORY  Chief Complaint Chest Pain; Shortness of Breath; and Cough   HPI Gabriel [name]. is a 48 y.o. male with a past medical history of chronic chest wall pain, CHF, CKD, chronic troponin elevation, diabetes, hypertension, prior MI, nonischemic cardiomyopathy, CAD, presents to the emergency department for left chest pain.  According to the patient for the past 3 to 4 days he has been experiencing left-sided chest pain and occasional cough.  States the pain is worse with movement or if he pushes on the area.  Patient denies any fever.  No travel.  Patient called his doctor who referred him to the ER today.   Past Medical History:  Diagnosis Date  . Chest wall pain, chronic   . Chronic systolic CHF (congestive heart failure) ([place])    a. 03/2015 Echo: EF 45-50%; b. 12/2015 Echo: EF 20-25%; c. 02/2016 Echo: EF 30-35%; d. 11/2016 Echo: EF 40-45%.  . Chronic Troponin Elevation   . CKD (chronic kidney disease), stage III ([place])   . COPD (chronic obstructive pulmonary disease) ([place])   . Diabetes mellitus without complication ([place])   . Hypertension    Resolved since weight loss  . Myocardial infarction ([place])   . NICM (nonischemic cardiomyopathy) ([place])    a. 03/2015 Echo: EF 45-50%; b. 04/2015 MV: small defect of mild severity in apex 2/2 apical thinning, EF 30-44%;  c. 12/2015 Echo: EF 20-25%; d. 02/2016 Echo: EF 30-35%; e. 11/2016 Echo: EF 40-45%, diff HK, mildly dil Ao root, mild MR, mod dil LA;  f. 12/2016 Cath: LM nl, LAD/Diags/LCX/OMs min irregs, RCA 40p/m/d.  . Non-obstructive CAD (coronary artery disease)    a. 04/2015 low risk MV;  b. 12/2016 Cath: minor irregs in LAD/Diag/LCX/OM, RCA 40p/m/d.  [name] (nonsustained ventricular tachycardia) ([place])    a. 12/2015 noted on tele-->amio;  b. 12/2015 Event monitor: no VT. Atach noted.  . Obesity  (BMI 30.0-34.9)   . Psoriasis   . Renal insufficiency   . Syncope    a. 01/2016 - felt to be vasovagal.  . Tobacco abuse     Patient Active Problem List   Diagnosis Date Noted  . Chronic systolic heart failure ([place]) 09/02/2017  . Chest pain 08/24/2017  . Dizziness   . Noncompliance with medications 04/29/2017  . Back pain 03/06/2017  . Tobacco use 12/04/2016  . Gallbladder sludge 12/04/2016  . Coronary artery disease, non-occlusive 03/15/2016  . Atypical chest pain 02/16/2016  . Orthostatic hypotension 01/23/2016  . Hypomagnesemia 01/23/2016  . Congestive dilated cardiomyopathy ([place]) 01/17/2016  . NSVT (nonsustained ventricular tachycardia) ([place])   . Elevated troponin   . Diabetes ([place]) 05/17/2015  . Hyperlipidemia 05/17/2015  . COPD (chronic obstructive pulmonary disease) ([place]) 05/17/2015  . HTN (hypertension) 03/31/2015    Past Surgical History:  Procedure Laterality Date  . AMPUTATION    . CARDIAC CATHETERIZATION    . FINGER AMPUTATION     Traumatic  . FINGER FRACTURE SURGERY Left   . LEFT HEART CATH AND CORONARY ANGIOGRAPHY N/A 01/06/2017   Procedure: Left Heart Cath and Coronary Angiography;  Surgeon: Wellington Hampshire, MD;  Location: [facility] [facility];  Service: Cardiovascular;  Laterality: N/A;    Prior to Admission medications   Medication Sig Start Date End Date Taking? Authorizing Provider  aspirin 81 MG chewable tablet Chew 1 tablet (  81 mg total) by mouth daily. Reported on 02/08/2016 08/27/18   Alisa Graff, FNP  insulin detemir (LEVEMIR) 100 UNIT/ML injection Inject 0.24 mLs (24 Units total) into the skin at bedtime. 09/30/18   Tawni Millers, MD  metFORMIN (GLUCOPHAGE) 500 MG tablet Take 1 tablet (500mg ) with breakfast and 1 tablet (500mg ) at dinner. 09/30/18   Tawni Millers, MD  metoprolol (TOPROL XL) 200 MG 24 hr tablet Take 1 tablet (200 mg total) by mouth daily. Patient taking differently: Take 200 mg by mouth 2 (two) times daily.  08/27/18   Alisa Graff, FNP  pantoprazole (PROTONIX) 20 MG tablet Take 1 tablet (20 mg total) by mouth at bedtime. 09/09/18   Tawni Millers, MD  rosuvastatin (CRESTOR) 20 MG tablet Take 1 tablet (20 mg total) by mouth daily. 08/27/18   Alisa Graff, FNP  sacubitril-valsartan (ENTRESTO) 97-103 MG Take 1 tablet by mouth 2 (two) times daily. 08/27/18   Alisa Graff, FNP  spironolactone (ALDACTONE) 25 MG tablet Take 1 tablet (25 mg total) by mouth 2 (two) times daily. 08/05/18   Tawni Millers, MD  torsemide (DEMADEX) 20 MG tablet Take 2 tablets (40 mg total) by mouth daily. 06/22/18   Alisa Graff, FNP    Allergies  Allergen Reactions  . Prednisone Other (See Comments)    Reaction: Hallucinations    . Zantac [Ranitidine Hcl] Diarrhea and Nausea Only    Night sweats    Family History  Problem Relation Age of Onset  . Diabetes Mellitus II Mother   . Hypertension Father   . Cancer Paternal Aunt   . Cancer Maternal Grandfather   . Diabetes Maternal Grandfather     Social History Social History   Tobacco Use  . Smoking status: Former Smoker    Packs/day: 1.00    Years: 33.00    Pack years: 33.00    Types: Cigarettes  . Smokeless tobacco: Never Used  . Tobacco comment: Quit a little over a month ago.  Substance Use Topics  . Alcohol use: No    Alcohol/week: 0.0 standard drinks    Frequency: Never    Comment: occassionally  . Drug use: No    Review of Systems Constitutional: Negative for fever. Cardiovascular: Left-sided chest pain Respiratory: Occasional cough.  Mild shortness of breath. Gastrointestinal: Negative for abdominal pain Musculoskeletal: No leg pain  Skin: Negative for skin complaints  Neurological: Negative for headache All other ROS negative  ____________________________________________   PHYSICAL EXAM:  VITAL SIGNS: ED Triage Vitals  Enc Vitals Group     BP 11/19/18 1255 (!) 150/99     Pulse Rate 11/19/18 1255 79     Resp 11/19/18 1255 20     Temp 11/19/18  1255 97.7 F (36.5 C)     Temp Source 11/19/18 1255 Oral     SpO2 11/19/18 1255 95 %     Weight 11/19/18 1253 245 lb (111.1 kg)     Height 11/19/18 1253 6' (1.829 m)     Head Circumference --      Peak Flow --      Pain Score 11/19/18 1253 10     Pain Loc --      Pain Edu? --      Excl. in [place]? --     Constitutional: Alert and oriented. Well appearing and in no distress. Eyes: Normal exam ENT   Head: Normocephalic and atraumatic.   Mouth/Throat: Mucous membranes are moist. Cardiovascular: Normal  rate, regular rhythm.  Respiratory: Normal respiratory effort without tachypnea nor retractions. Breath sounds are clear.  Occasional dry cough. Gastrointestinal: Soft and nontender. No distention.   Musculoskeletal: Nontender with normal range of motion in all extremities. No lower extremity tenderness.  Moderate left chest tenderness to palpation. Neurologic:  Normal speech and language. No gross focal neurologic deficits Skin:  Skin is warm, dry and intact.  Psychiatric: Mood and affect are normal.   ____________________________________________    EKG  EKG viewed and interpreted by myself shows a normal sinus rhythm 89 bpm with a narrow QRS, normal axis, normal intervals besides slight QTC prolongation, nonspecific ST changes without ST elevation.  ____________________________________________    RADIOLOGY  Chronic bronchitic changes.  ____________________________________________   INITIAL IMPRESSION / ASSESSMENT AND PLAN / ED COURSE  Pertinent labs & imaging results that were available during my care of the patient were reviewed by me and considered in my medical decision making (see chart for details).  Patient presents to the emergency department for left-sided chest pain ongoing x4 days occasional cough intermittent mild shortness of breath.  Differential would include CHF, COPD, pneumonia, pneumothorax, ACS, URI.  Overall the patient appears well, clear breath  sounds bilaterally however does have an occasional cough.  No fever.  No travel.  We will check labs, chest x-ray and continue to closely monitor.  Patient's lab work is largely [place].  Elevated H&H however largely unchanged from baseline.  Troponin 0.05, again largely unchanged from baseline.  Chest x-ray shows chronic changes but no acute abnormality.  Given the patient's 4 days of cough we will cover with a short course of antibiotics and the patient will follow-up with his PCP as he is able.  Discussed return precautions.  Gabriel [name]. was evaluated in Emergency Department on 11/19/2018 for the symptoms described in the history of present illness. He was evaluated in the context of the global COVID-19 pandemic, which necessitated consideration that the patient might be at risk for infection with the SARS-CoV-2 virus that causes COVID-19. Institutional protocols and algorithms that pertain to the evaluation of patients at risk for COVID-19 are in a state of rapid change based on information released by regulatory bodies including the CDC and federal and state organizations. These policies and algorithms were followed during the patient's care in the ED.   ____________________________________________   FINAL CLINICAL IMPRESSION(S) / ED DIAGNOSES  Chest pain Cough   Harvest Dark, MD 11/19/18 1425

## 2018-11-24 ENCOUNTER — Telehealth: Payer: Self-pay

## 2018-11-24 NOTE — Telephone Encounter (Signed)
Called patient to schedule an ED follow up appointment.  Offered patient a telephone or video visit due to [place].  Patient was not happy with that and began to get mad because he would like to see "his doctor" in person.  Scheduled him for 11/30/2018 at 10:40AM in office.

## 2018-11-26 NOTE — Telephone Encounter (Signed)
Appointment schedule for 11/30/2018

## 2018-11-27 IMAGING — US US ABDOMEN LIMITED
1 series · 14 of 25 positions shown · non-contrast
Comparison: 01/28/2017 right upper quadrant ultrasound.

CLINICAL DATA: 46 y/o  M; abdominal pain.

EXAM:
ULTRASOUND ABDOMEN LIMITED RIGHT UPPER QUADRANT

[Series 1: us abdomen limited · 0.28mm/px · 14 of 50 slices shown]
[im 1/50]
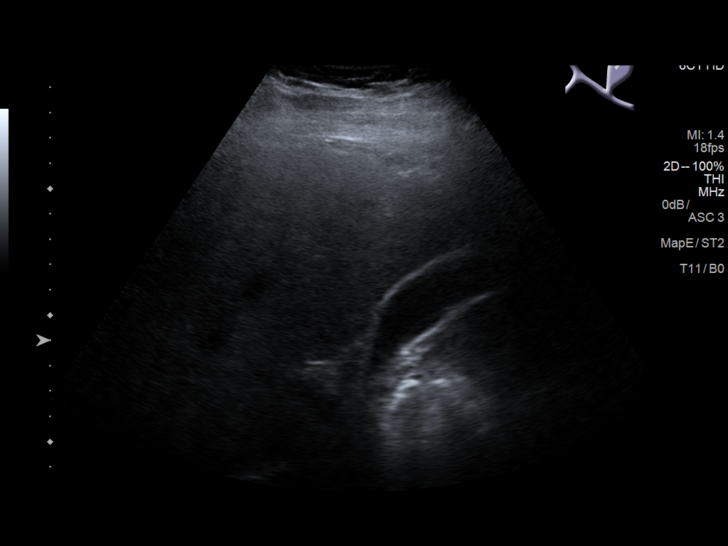
[im 5/50]
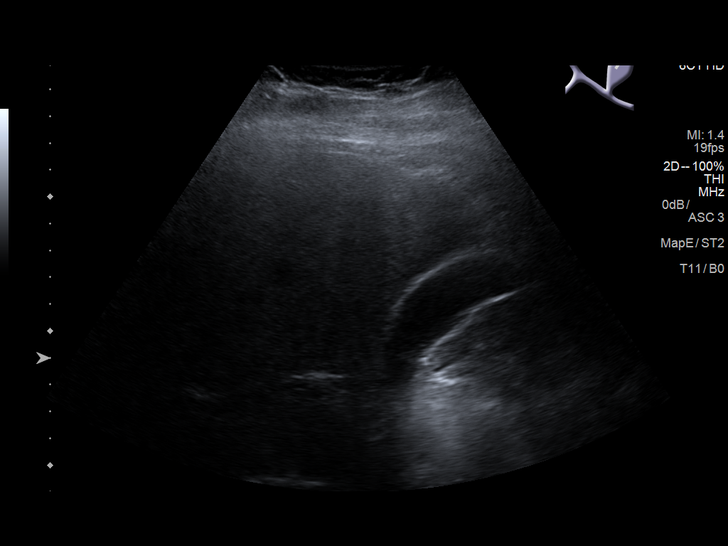
[im 9/50]
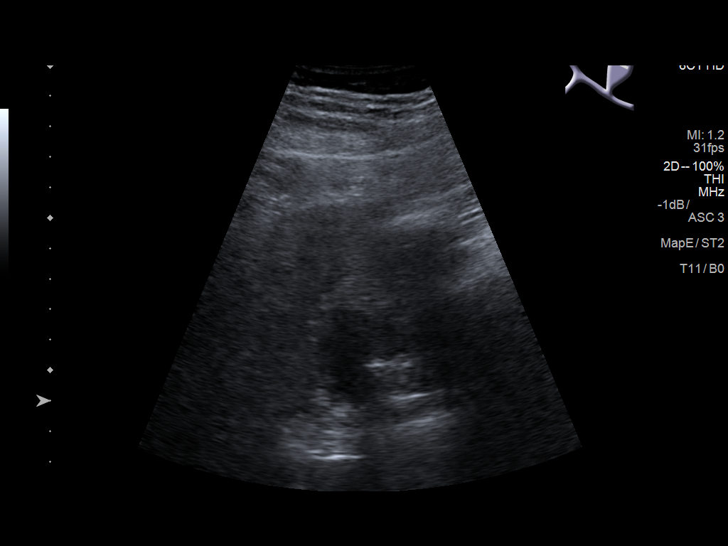
[im 13/50]
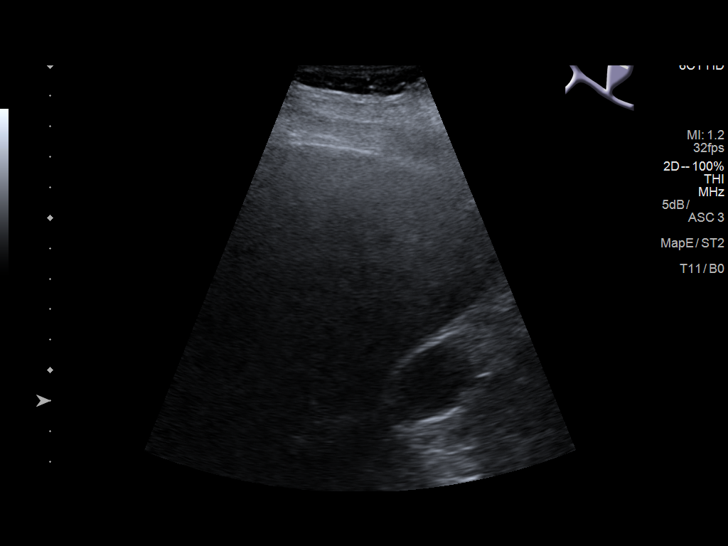
[im 17/50]
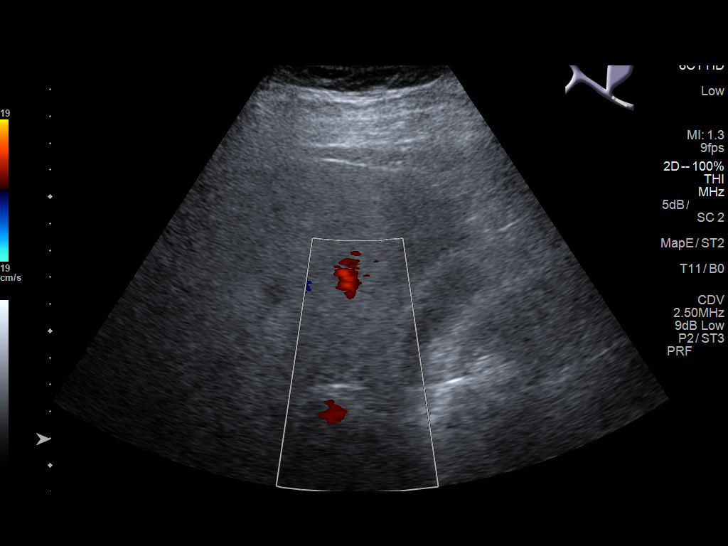
[im 19/50]
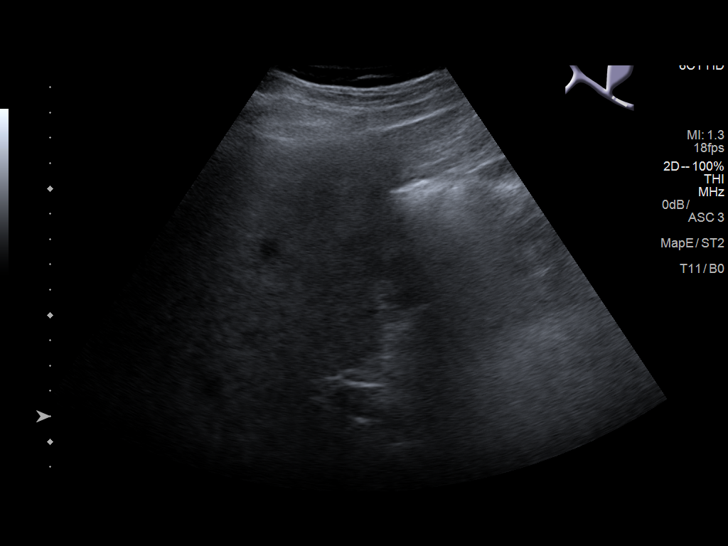
[im 23/50]
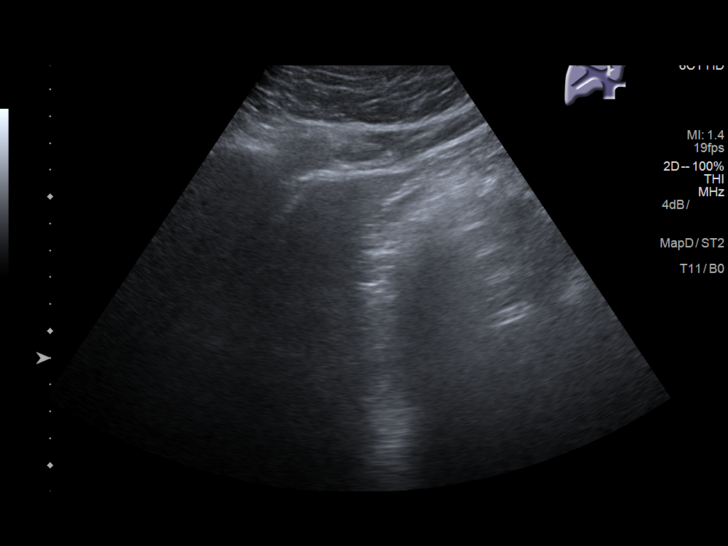
[im 27/50]
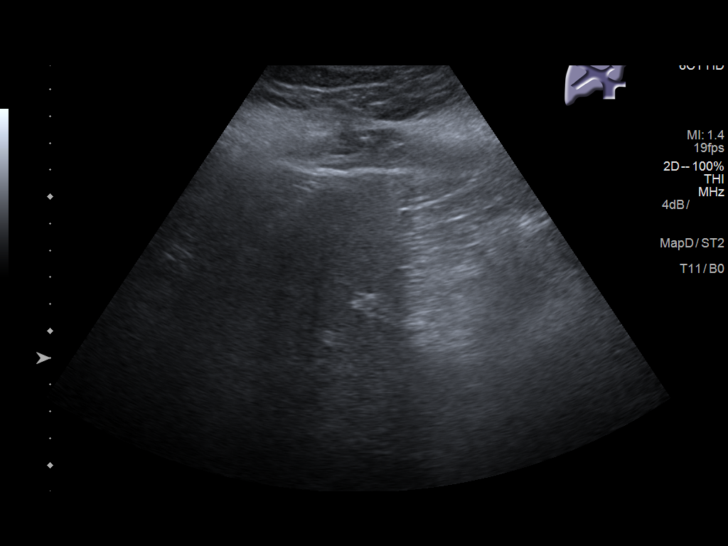
[im 31/50]
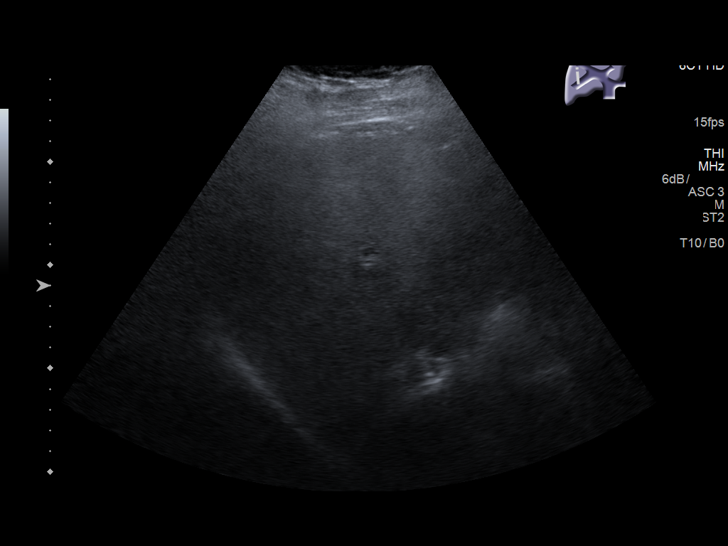
[im 33/50]
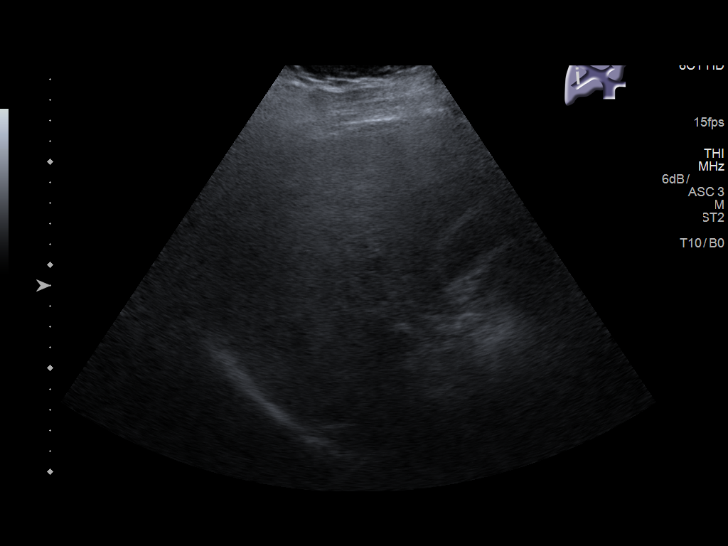
[im 37/50]
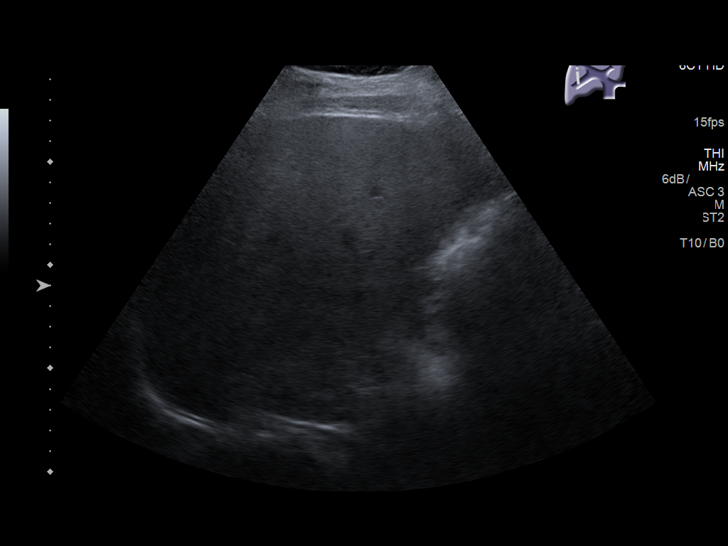
[im 41/50]
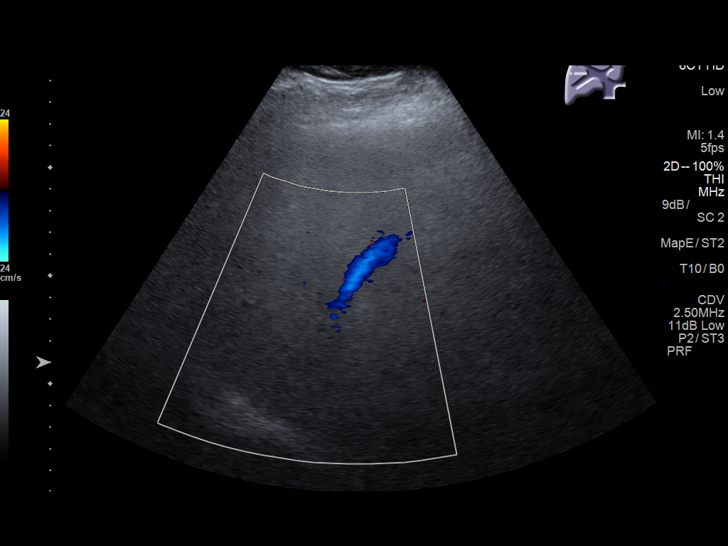
[im 45/50]
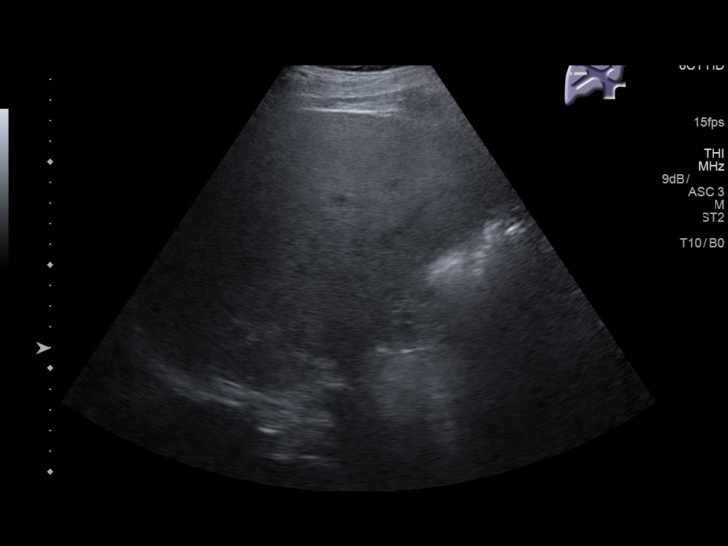
[im 50/50]
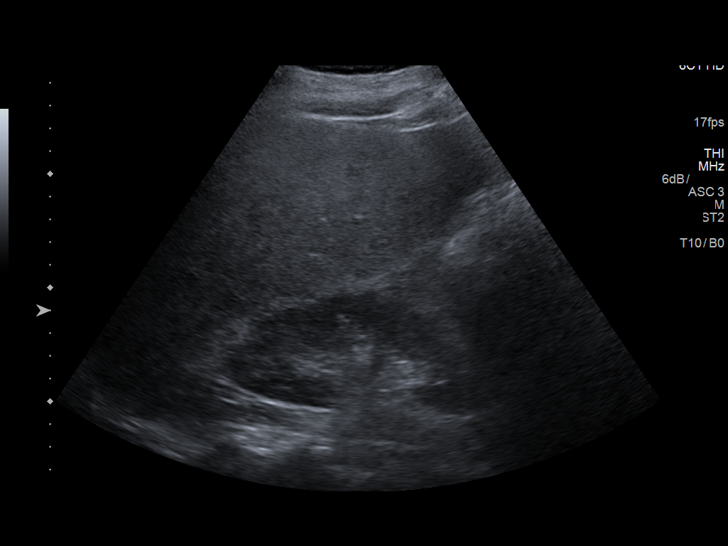

[14 of 25 positions shown; findings below may reference images not displayed]

FINDINGS: Gallbladder:

No gallstones or wall thickening visualized. No sonographic Murphy
sign noted by sonographer.

Common bile duct:

Diameter: 3.7 mm

Liver:

No focal liver lesion. Increased liver echogenicity. Portal vein is
patent on color Doppler imaging with normal direction of blood flow
towards the liver.
IMPRESSION: 1. No acute process identified.
2. Stable hepatic steatosis.

By: Syiqin Bgs M.D.

## 2018-11-29 NOTE — Progress Notes (Deleted)
Telephone Visit     Evaluation Performed:  Follow-up visit  This visit type was conducted due to national recommendations for restrictions regarding the COVID-19 Pandemic (e.g. social distancing).  This format is felt to be most appropriate for this patient at this time.  All issues noted in this document were discussed and addressed.  No physical exam was performed (except for noted visual exam findings with Telehealth visits).  See MyChart message from today for the patient's consent to telehealth for Gabriel [name].  Date:  11/29/2018   ID:  Gabriel [name]., DOB 11-06-1970, MRN [id number]  Patient Location:  [place] [address]   Provider location:   [facility], [place] office  PCP:  Alisa Graff, FNP  Cardiologist:  Arvid Right [facility]  Chief Complaint:      History of Present Illness:    Gabriel [name]. is a 48 y.o. male who presents via audio/video conferencing for a telehealth visit today.   The patient does not symptoms concerning for COVID-19 infection (fever, chills, cough, or new SHORTNESS OF BREATH).   Patient has a past medical history of 48 y.o. male with h/o  nonischemic cardiomyopathy by nuclear stress test in 2016, ejection fraction 25% at that time now up to 35 to 50% September 2018 History of medication noncompliance Left heart cath 01/06/2017 Nonobstructive coronary disease, 40% in RCA  up to 45% - 50% In September 2018 COPD,  ongoing tobacco abuse at 2 ppd since age 26,  DM2,  obesity  chronically elevated troponins given his chronic renal insufficiency. [place] on 01/03/16 with cough and SOB, septic shock, right upper lobe pneumonia,  History of chronic chest pain sleep study in the past, was positive, did not tolerate CPAP.  He presents today for follow-up of his nonischemic cardiomyopathy   On prior office visit 2019 Trying to get disability Prior history of medication noncompliance Last office visit  reported compliance with his medications  did not complete cardiac rehabilitation her pulmonary rehabilitation Does not have a job, no money Father providing for everything per the patient    Chronic pain issues Reports that he quit smoking one month ago  HBA1C 7.1, weight running high   Other past medical history reviewed In hospital 04/2017 orthostatic lightheadedness in the setting of acute kidney injury. Creatinine 2.6 intravascular volume depletion Meds: Entresto,  torsemide to 40 mg daily,  metoprolol succinate to 50 mg daily. Spironolactone held  LVEF, now 45-50%.   does not have insurance, lives with his father Previously did not fill out paperwork for medical management/assistance with his medications as he does not want to give his father's information Currently unemployed Reports he is trying to [place], get disability Followed at [facility]    sleep study in the past, was positive, did not tolerate CPAP.  Does not want to wear that, feels he sleeps fine  On prior office visit, going to the pool with his family, doing various activities Does not feel that he can work, has bills to pay, has not worked Does not have money for medications  No episodes of syncope   previously 25% in the setting of sepsis  Ejection fraction has since improved up to 45 to 50% as of 2 years ago 2018    Prior CV studies:   The following studies were reviewed today:    Past Medical History:  Diagnosis Date  . Chest wall pain, chronic   . Chronic systolic  CHF (congestive heart failure) ([place])    a. 03/2015 Echo: EF 45-50%; b. 12/2015 Echo: EF 20-25%; c. 02/2016 Echo: EF 30-35%; d. 11/2016 Echo: EF 40-45%.  . Chronic Troponin Elevation   . CKD (chronic kidney disease), stage III ([place])   . COPD (chronic obstructive pulmonary disease) ([place])   . Diabetes mellitus without complication ([place])   . Hypertension    Resolved since weight loss  . Myocardial infarction  ([place])   . NICM (nonischemic cardiomyopathy) ([place])    a. 03/2015 Echo: EF 45-50%; b. 04/2015 MV: small defect of mild severity in apex 2/2 apical thinning, EF 30-44%;  c. 12/2015 Echo: EF 20-25%; d. 02/2016 Echo: EF 30-35%; e. 11/2016 Echo: EF 40-45%, diff HK, mildly dil Ao root, mild MR, mod dil LA;  f. 12/2016 Cath: LM nl, LAD/Diags/LCX/OMs min irregs, RCA 40p/m/d.  . Non-obstructive CAD (coronary artery disease)    a. 04/2015 low risk MV;  b. 12/2016 Cath: minor irregs in LAD/Diag/LCX/OM, RCA 40p/m/d.  [name] (nonsustained ventricular tachycardia) ([place])    a. 12/2015 noted on tele-->amio;  b. 12/2015 Event monitor: no VT. Atach noted.  . Obesity (BMI 30.0-34.9)   . Psoriasis   . Renal insufficiency   . Syncope    a. 01/2016 - felt to be vasovagal.  . Tobacco abuse    Past Surgical History:  Procedure Laterality Date  . AMPUTATION    . CARDIAC CATHETERIZATION    . FINGER AMPUTATION     Traumatic  . FINGER FRACTURE SURGERY Left   . LEFT HEART CATH AND CORONARY ANGIOGRAPHY N/A 01/06/2017   Procedure: Left Heart Cath and Coronary Angiography;  Surgeon: Wellington Hampshire, MD;  Location: [facility];  Service: Cardiovascular;  Laterality: N/A;     No outpatient medications have been marked as taking for the 11/30/18 encounter (Appointment) with Minna Merritts, MD.     Allergies:   Prednisone and Zantac [ranitidine hcl]   Social History   Tobacco Use  . Smoking status: Former Smoker    Packs/day: 1.00    Years: 33.00    Pack years: 33.00    Types: Cigarettes  . Smokeless tobacco: Never Used  . Tobacco comment: Quit a little over a month ago.  Substance Use Topics  . Alcohol use: No    Alcohol/week: 0.0 standard drinks    Frequency: Never    Comment: occassionally  . Drug use: No     Current Outpatient Medications on File Prior to Visit  Medication Sig Dispense Refill  . aspirin 81 MG chewable tablet Chew 1 tablet (81 mg total) by mouth daily. Reported on 02/08/2016 90  tablet 3  . insulin detemir (LEVEMIR) 100 UNIT/ML injection Inject 0.24 mLs (24 Units total) into the skin at bedtime. 10 mL 3  . metFORMIN (GLUCOPHAGE) 500 MG tablet Take 1 tablet (500mg ) with breakfast and 1 tablet (500mg ) at dinner. 90 tablet 3  . metoprolol (TOPROL XL) 200 MG 24 hr tablet Take 1 tablet (200 mg total) by mouth daily. (Patient taking differently: Take 200 mg by mouth 2 (two) times daily. ) 90 tablet 3  . pantoprazole (PROTONIX) 20 MG tablet Take 1 tablet (20 mg total) by mouth at bedtime. 90 tablet 3  . rosuvastatin (CRESTOR) 20 MG tablet Take 1 tablet (20 mg total) by mouth daily. 90 tablet 3  . sacubitril-valsartan (ENTRESTO) 97-103 MG Take 1 tablet by mouth 2 (two) times daily. 180 tablet 3  . spironolactone (ALDACTONE) 25 MG tablet  Take 1 tablet (25 mg total) by mouth 2 (two) times daily. 90 tablet 3  . torsemide (DEMADEX) 20 MG tablet Take 2 tablets (40 mg total) by mouth daily. 180 tablet 3   No current facility-administered medications on file prior to visit.      Family Hx: The patient's family history includes Cancer in his maternal grandfather and paternal aunt; Diabetes in his maternal grandfather; Diabetes Mellitus II in his mother; Hypertension in his father.  ROS:   Please see the history of present illness.    ROS    Labs/Other Tests and Data Reviewed:    Recent Labs: 01/09/2018: Magnesium 1.5 09/09/2018: TSH 1.740 11/19/2018: ALT 46; BUN 22; Creatinine, Ser 1.22; Hemoglobin 19.0; Platelets 237; Potassium 3.7; Sodium 138   Recent Lipid Panel Lab Results  Component Value Date/Time   CHOL CANCELED 09/02/2018 09:44 AM   TRIG CANCELED 09/02/2018 09:44 AM   HDL CANCELED 09/02/2018 09:44 AM   CHOLHDL 6.6 (H) 07/29/2018 12:00 PM   CHOLHDL 5.3 01/04/2016 02:01 PM   LDLCALC Comment 07/29/2018 12:00 PM    Wt Readings from Last 3 Encounters:  11/19/18 245 lb (111.1 kg)  09/30/18 247 lb 9.6 oz (112.3 kg)  09/09/18 245 lb 12.8 oz (111.5 kg)     Exam:     Vital Signs: Vital signs may also be detailed in the HPI There were no vitals taken for this visit.  Wt Readings from Last 3 Encounters:  11/19/18 245 lb (111.1 kg)  09/30/18 247 lb 9.6 oz (112.3 kg)  09/09/18 245 lb 12.8 oz (111.5 kg)   Temp Readings from Last 3 Encounters:  11/19/18 97.7 F (36.5 C) (Oral)  09/09/18 98.3 F (36.8 C) (Oral)  08/05/18 98.1 F (36.7 C) (Oral)   BP Readings from Last 3 Encounters:  11/19/18 122/88  09/30/18 (!) 150/95  09/09/18 137/89   Pulse Readings from Last 3 Encounters:  11/19/18 81  09/30/18 63  09/09/18 92     Well nourished, well developed male in no acute distress. Constitutional:  oriented to person, place, and time. No distress.  Head: Normocephalic and atraumatic.  Eyes:  no discharge. No scleral icterus.  Neck: Normal range of motion. Neck supple.  Pulmonary/Chest: No audible wheezing, no distress, appears comfortable Musculoskeletal: Normal range of motion.  no  tenderness or deformity.  Neurological:   Coordination normal. Full exam not performed Skin:  No rash Psychiatric:  normal mood and affect. behavior is normal. Thought content normal.    ASSESSMENT & PLAN:    Problem List Items Addressed This Visit    None        COVID-19 Education: The signs and symptoms of COVID-19 were discussed with the patient and how to seek care for testing (follow up with PCP or arrange E-visit).  The importance of social distancing was discussed today.  Patient Risk:   After full review of this patients clinical status, I feel that they are at least moderate risk at this time.  Time:   Today, I have spent 25 minutes with the patient with telehealth technology discussing the cardiac and medical problems/diagnoses detailed above     Medication Adjustments/Labs and Tests Ordered: Current medicines are reviewed at length with the patient today.  Concerns regarding medicines are outlined above.   Tests Ordered: No tests ordered    Medication Changes: No changes made   Disposition: Follow-up in 6 months   Signed, [name], MD  11/29/2018 12:31 PM    [facility] [address], [place], [place] [address]

## 2018-11-30 ENCOUNTER — Ambulatory Visit: Payer: Medicaid Other | Admitting: Cardiovascular Disease

## 2018-11-30 ENCOUNTER — Telehealth: Payer: Self-pay | Admitting: Cardiovascular Disease

## 2018-11-30 NOTE — Telephone Encounter (Signed)
Late Entry: Spoke with patient and he states that he called earlier and cancelled appointment due to not having a ride to the appointment. Advised that we are only seeing virtual visits through telephone and/or video and he said that he would like to call back at a later date to set that up. Advised that I would be happy to reschedule but he declined at this time. He was appreciative for the call with no further questions at this time.

## 2018-12-01 ENCOUNTER — Ambulatory Visit: Payer: Medicaid Other | Admitting: Cardiovascular Disease

## 2018-12-02 ENCOUNTER — Other Ambulatory Visit: Payer: Medicaid Other

## 2018-12-09 ENCOUNTER — Ambulatory Visit: Payer: Medicaid Other | Admitting: Internal Medicine

## 2018-12-22 IMAGING — CR DG CHEST 2V
1 series · 2 of 2 positions shown · non-contrast
Comparison: April 28, 2017.

CLINICAL DATA: Hypertension.  Shortness of Breath

EXAM:
CHEST  2 VIEW

[Series 1: dg chest 2 view · 0.14mm/px · 2 of 2 slices shown]
[im 1/2]
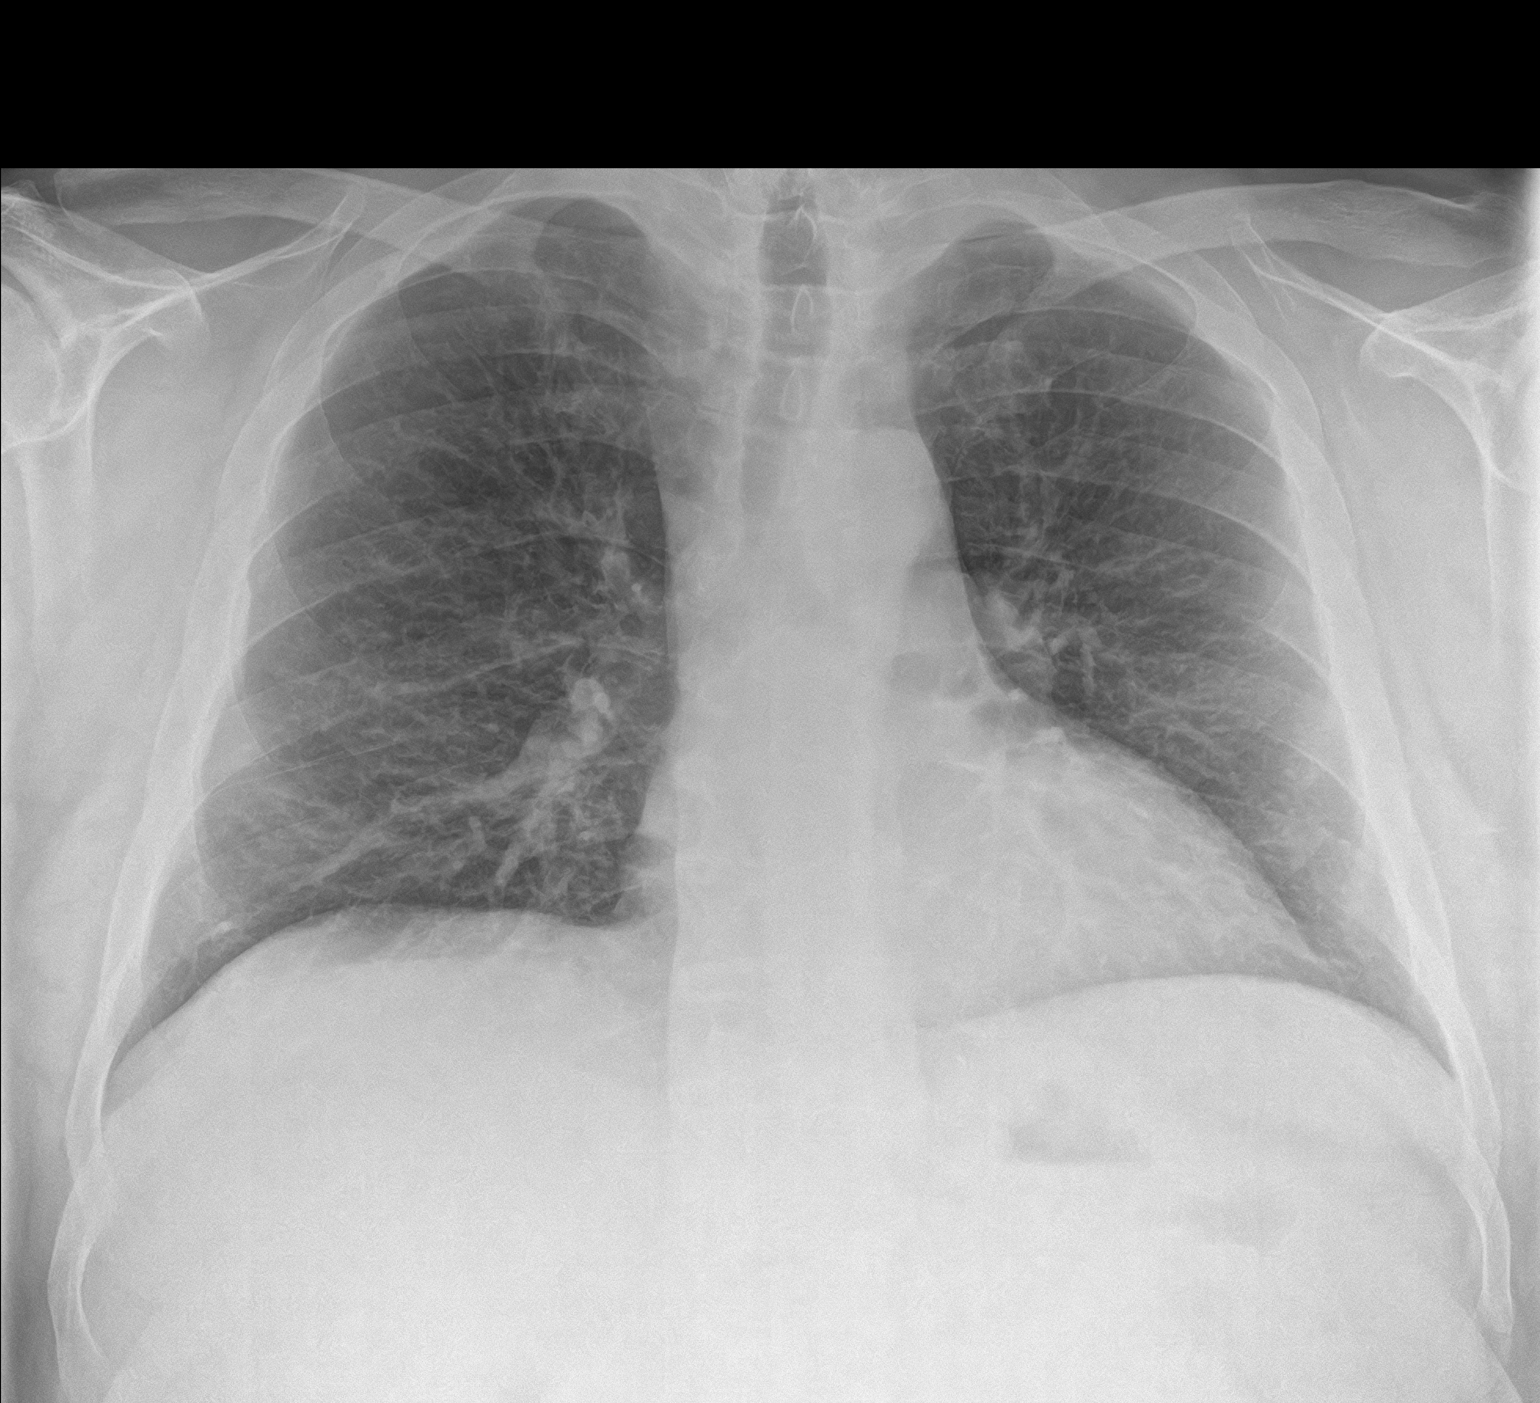
[im 2/2]
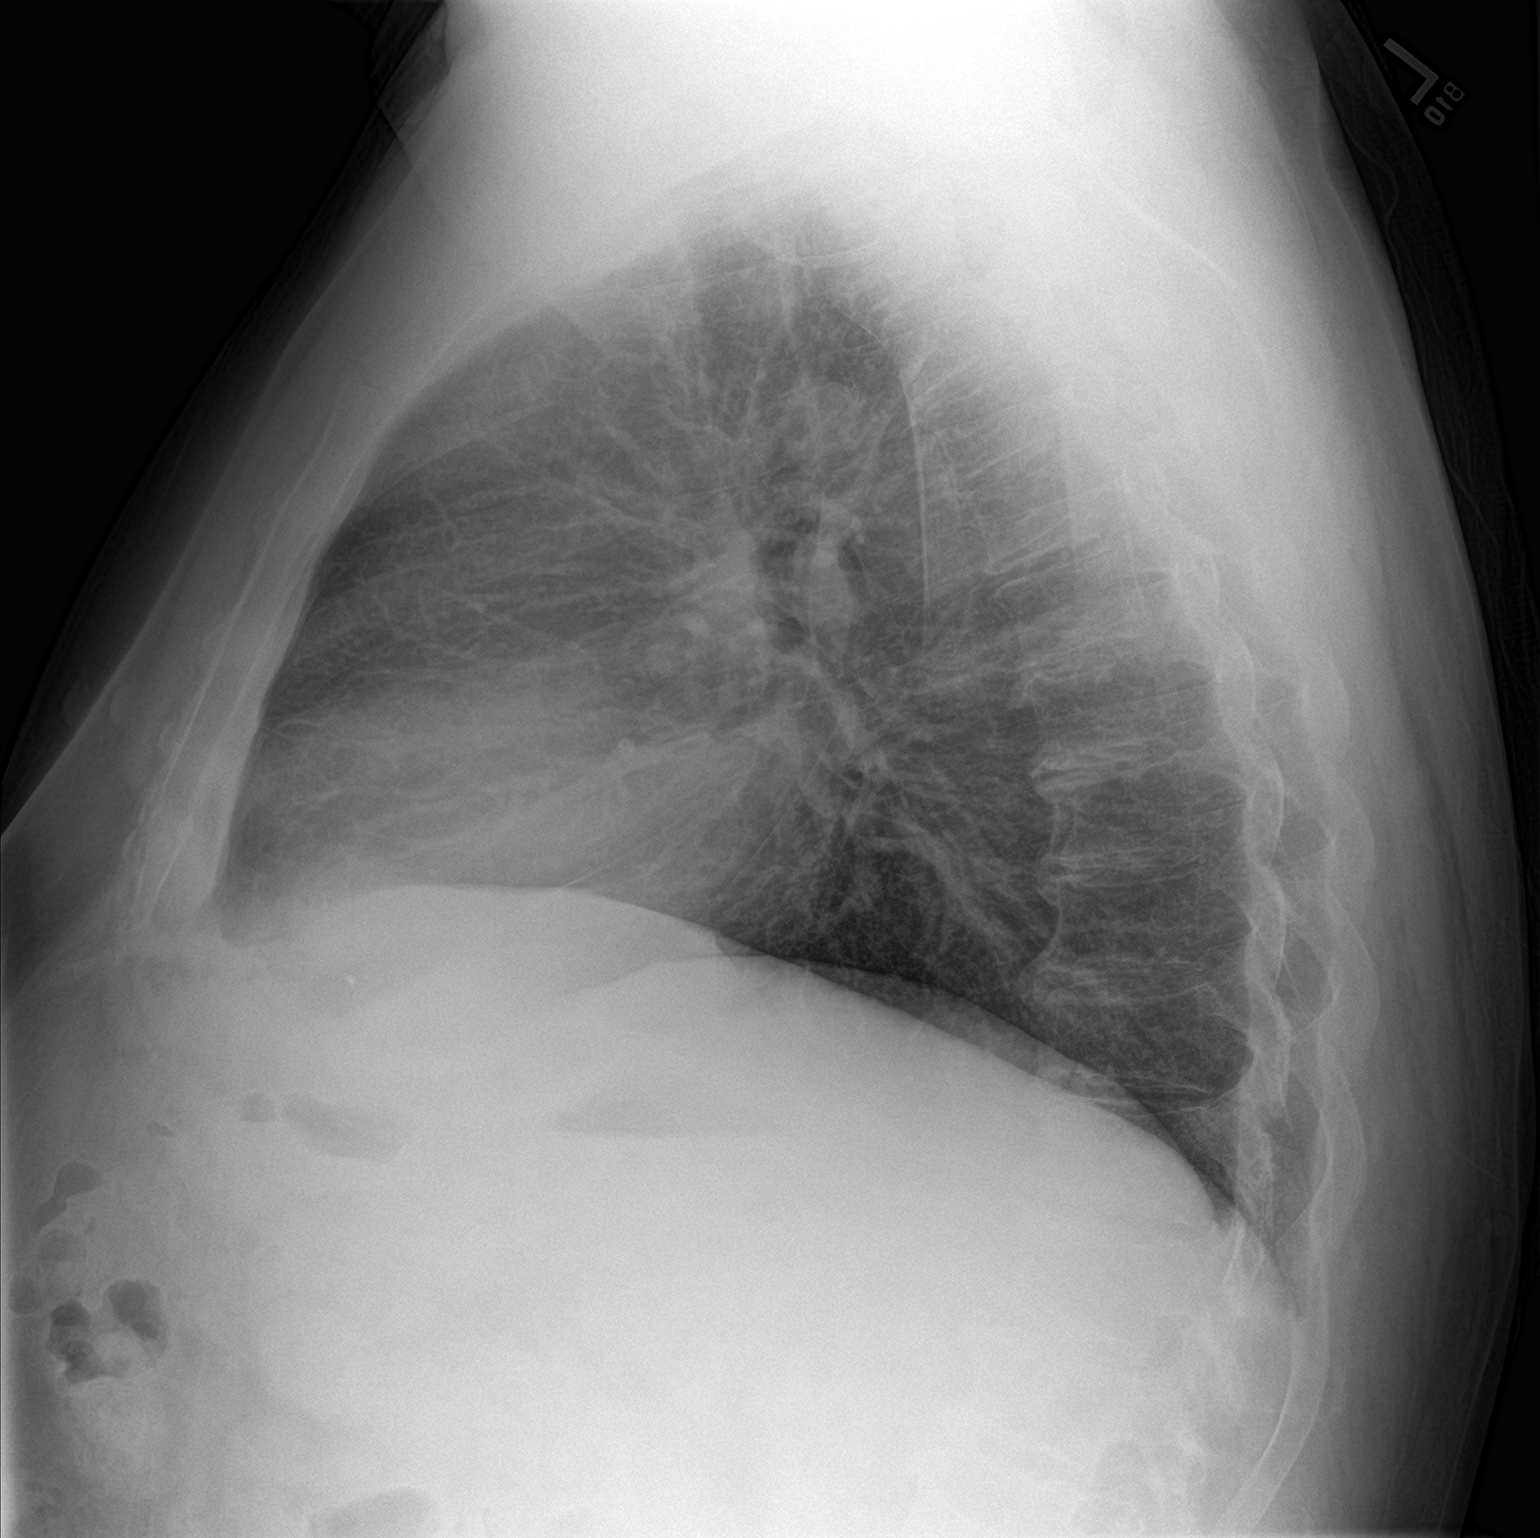

[2 of 2 positions shown; findings below may reference images not displayed]

FINDINGS: There is no edema or consolidation. Heart is upper normal in size
with pulmonary vascularity within normal limits. No adenopathy.
There is degenerative change in thoracic spine.
IMPRESSION: No edema or consolidation.

## 2018-12-22 IMAGING — CT CT HEAD W/O CM
3 series · 15 of 47 positions shown, 18 images · non-contrast
Comparison: April 14, 2012

CLINICAL DATA: Frontal region headache for 2 weeks, progressing.
Bilateral blurred vision

EXAM:
CT HEAD WITHOUT CONTRAST
TECHNIQUE: Contiguous axial images were obtained from the base of the skull
through the vertex without intravenous contrast.

[Series 2: head wo · axial · 0.45mm/px · z∈[-153,-13]mm · 9 of 34 slices shown, 12 images]
[im 3/34  brain]
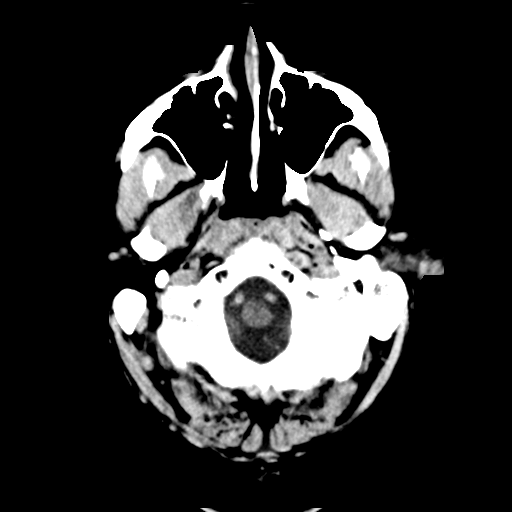
[im 3/34  bone]
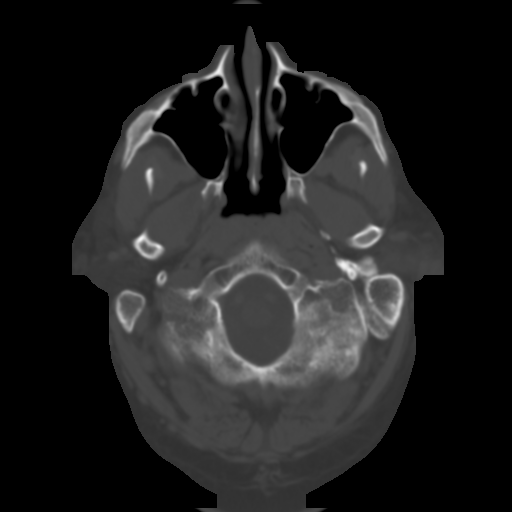
[im 6/34  brain]
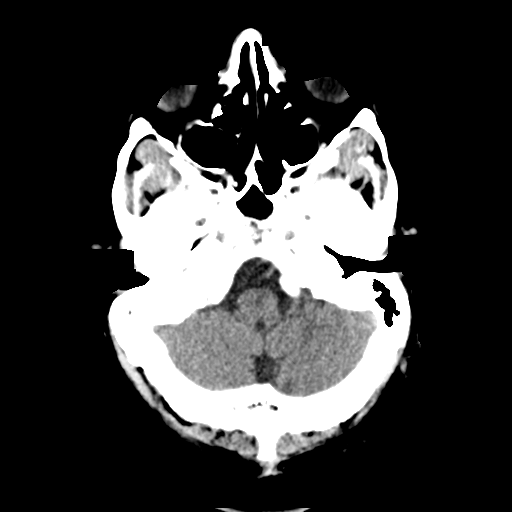
[im 10/34  brain]
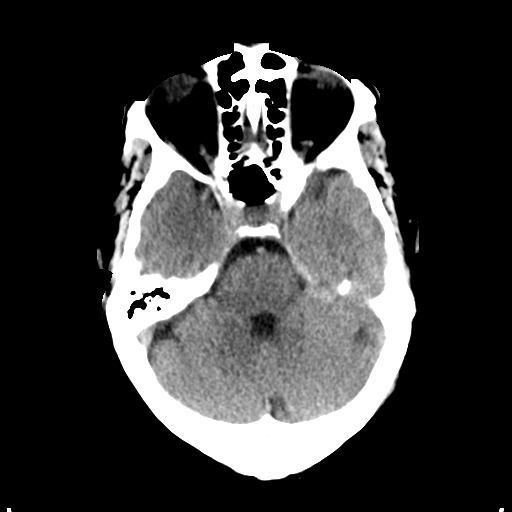
[im 13/34  brain]
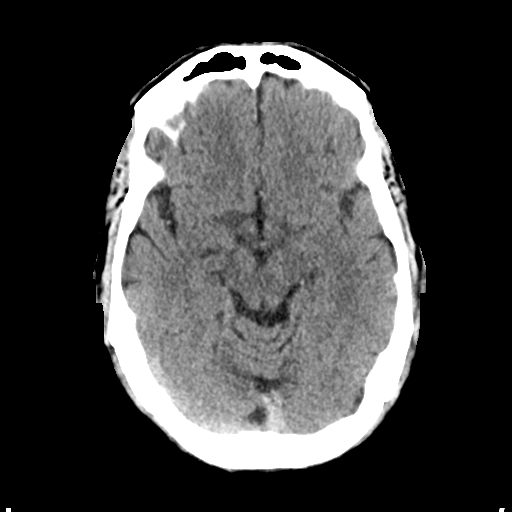
[im 18/34  brain]
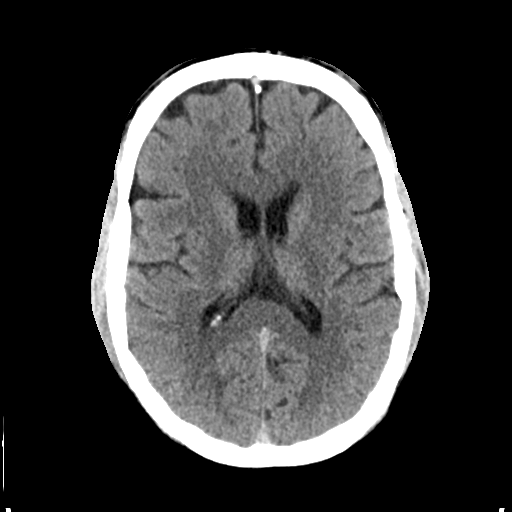
[im 18/34  bone]
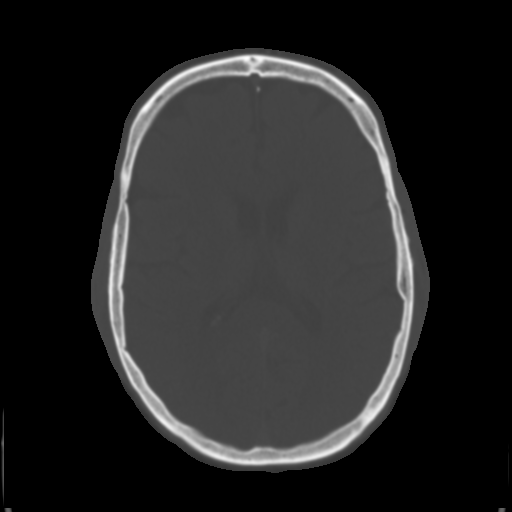
[im 21/34  brain]
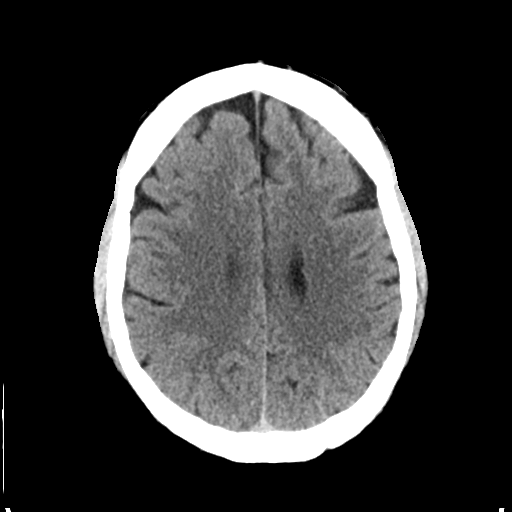
[im 24/34  brain]
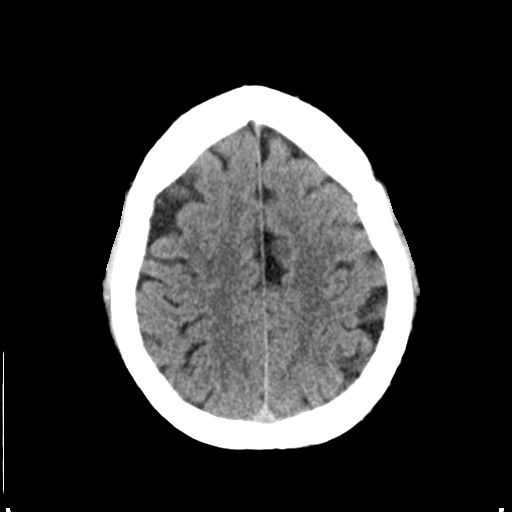
[im 28/34  brain]
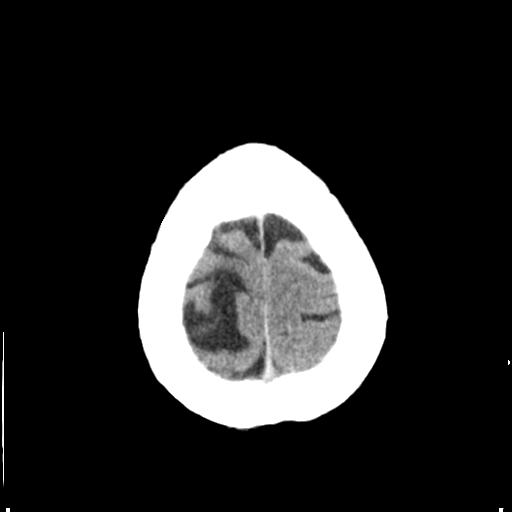
[im 31/34  brain]
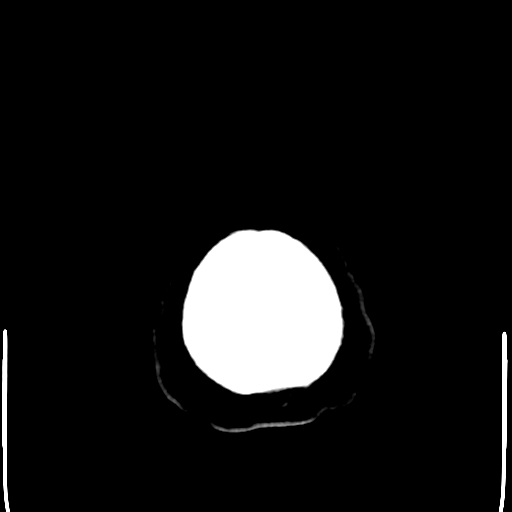
[im 31/34  bone]
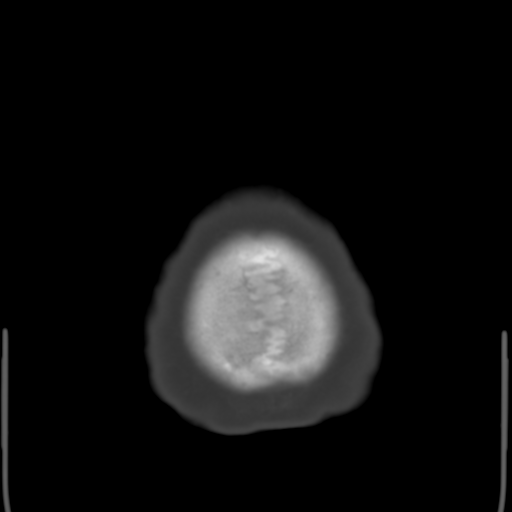

[Series 4: coronal soft tissue · coronal · 0.35mm/px · 3 of 76 slices shown]
[im 26/76  brain]
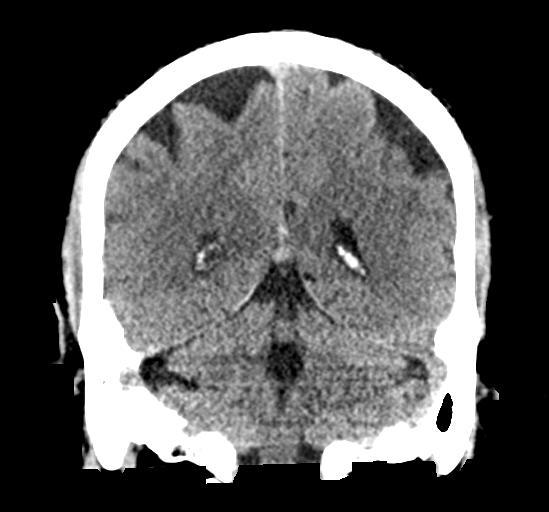
[im 34/76  brain]
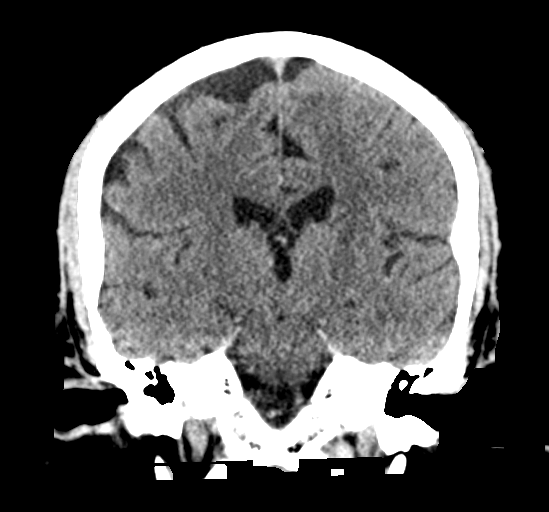
[im 42/76  brain]
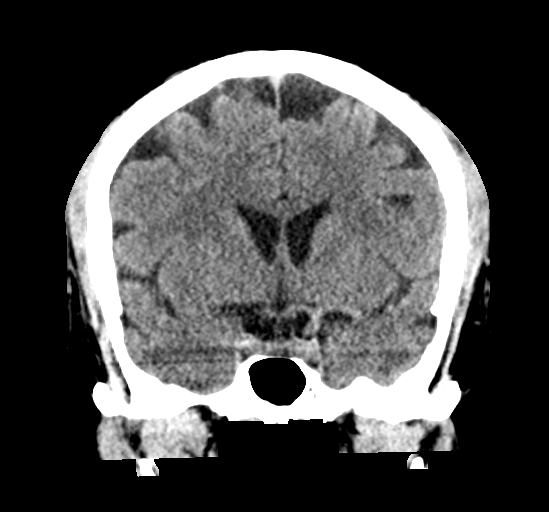

[Series 5: sagittal soft tissue · sagittal · 0.35mm/px · 3 of 55 slices shown]
[im 19/55  brain]
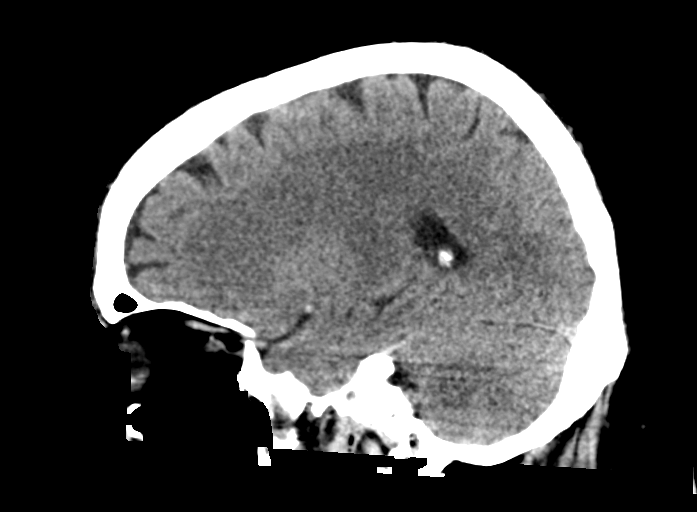
[im 28/55  brain]
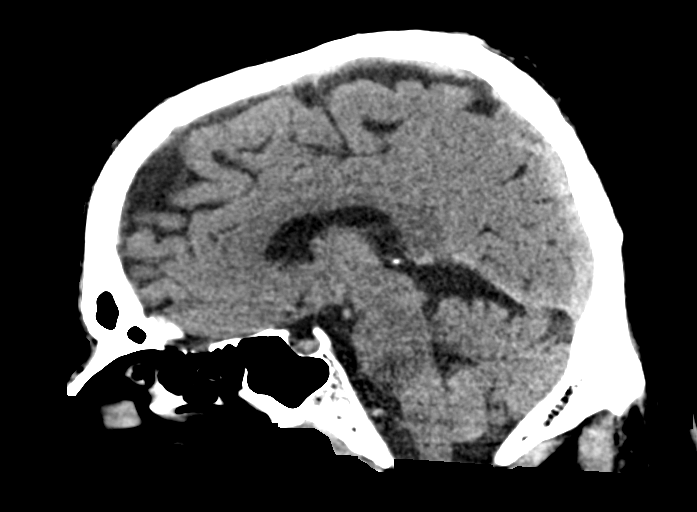
[im 37/55  brain]
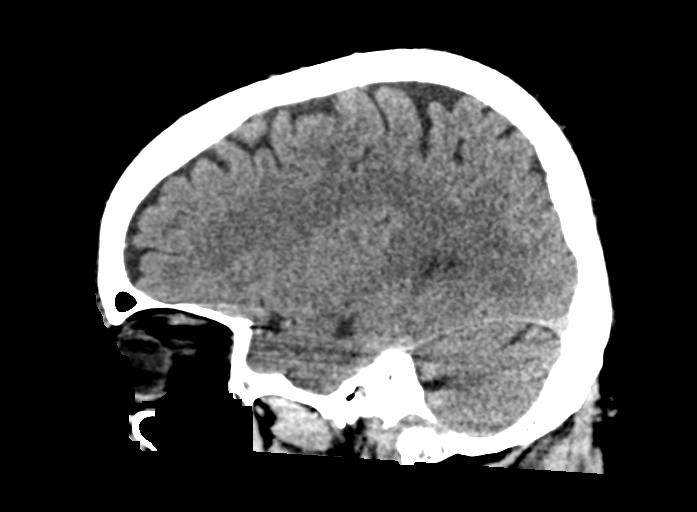

[15 of 47 positions shown; findings below may reference images not displayed]

FINDINGS: Brain: The ventricles are normal in size and configuration. There is
mild frontal and parietal lobe atrophy for age. There is no
intracranial mass, hemorrhage, extra-axial fluid collection, or
midline shift. The gray-white compartments are normal. No evident
acute infarct.

Vascular: There is no evident hyperdense vessel. There is no
appreciable vascular calcification.

Skull: The bony calvarium appears intact.

Sinuses/Orbits: Visualized paranasal sinuses are clear. Orbits
appear symmetric bilaterally.

Other: Mastoid air cells are clear.
IMPRESSION: Mild frontal and parietal lobe atrophy. Ventricles normal in size
and configuration. No intracranial mass, hemorrhage, or extra-axial
fluid collection. Gray-white compartments appear within normal
limits.

## 2018-12-23 ENCOUNTER — Telehealth: Payer: Self-pay | Admitting: Pharmacist

## 2018-12-23 NOTE — Telephone Encounter (Signed)
12/23/2018 10:26:29 AM - [name] refill  12/23/2018 Called [employer] spoke with Kalman Shan to place refill on Entresto 97/103 to be shipped to our office, we should receive Friday, May 8. This leaves 2 refills.[name]

## 2018-12-30 ENCOUNTER — Other Ambulatory Visit: Payer: Medicaid Other

## 2019-01-06 ENCOUNTER — Ambulatory Visit: Payer: Medicaid Other | Admitting: Internal Medicine

## 2019-01-06 DIAGNOSIS — R079 Chest pain, unspecified: Secondary | ICD-10-CM

## 2019-01-06 DIAGNOSIS — E119 Type 2 diabetes mellitus without complications: Secondary | ICD-10-CM

## 2019-01-06 DIAGNOSIS — Z794 Long term (current) use of insulin: Secondary | ICD-10-CM

## 2019-01-06 NOTE — Progress Notes (Signed)
   Subjective:    Patient ID: Gabriel [name]., male    DOB: 1971-01-11, 48 y.o.   MRN: [id number]  HPI  Patient is a 48 year old male who presents for a telephonic visit. Patient went to the ED in early April with chest pain. Patient received a diagnosis of Bronchitis and chest wall pain. Pt reports that he continues to have some intermittent pain in the left side of his chest from time to time.   Pt has been checking FSBS and reports readings that remain in the 100's.   Review of Systems   Patient Active Problem List   Diagnosis Date Noted  . Chronic systolic heart failure ([place]) 09/02/2017  . Chest pain 08/24/2017  . Dizziness   . Noncompliance with medications 04/29/2017  . Back pain 03/06/2017  . Tobacco use 12/04/2016  . Gallbladder sludge 12/04/2016  . Coronary artery disease, non-occlusive 03/15/2016  . Atypical chest pain 02/16/2016  . Orthostatic hypotension 01/23/2016  . Hypomagnesemia 01/23/2016  . Congestive dilated cardiomyopathy ([place]) 01/17/2016  . NSVT (nonsustained ventricular tachycardia) ([place])   . Elevated troponin   . Diabetes ([place]) 05/17/2015  . Hyperlipidemia 05/17/2015  . COPD (chronic obstructive pulmonary disease) ([place]) 05/17/2015  . HTN (hypertension) 03/31/2015   Allergies as of 01/06/2019      Reactions   Prednisone Other (See Comments)   Reaction: Hallucinations    Zantac [ranitidine Hcl] Diarrhea, Nausea Only   Night sweats      Medication List       Accurate as of Jan 06, 2019 11:14 AM. If you have any questions, ask your nurse or doctor.        aspirin 81 MG chewable tablet Chew 1 tablet (81 mg total) by mouth daily. Reported on 02/08/2016   insulin detemir 100 UNIT/ML injection Commonly known as:  Levemir Inject 0.24 mLs (24 Units total) into the skin at bedtime.   metFORMIN 500 MG tablet Commonly known as:  GLUCOPHAGE Take 1 tablet (500mg ) with breakfast and 1 tablet (500mg ) at dinner.   metoprolol 200 MG 24 hr tablet  Commonly known as:  Toprol XL Take 1 tablet (200 mg total) by mouth daily. What changed:  when to take this   pantoprazole 20 MG tablet Commonly known as:  Protonix Take 1 tablet (20 mg total) by mouth at bedtime.   rosuvastatin 20 MG tablet Commonly known as:  CRESTOR Take 1 tablet (20 mg total) by mouth daily.   sacubitril-valsartan 97-103 MG Commonly known as:  ENTRESTO Take 1 tablet by mouth 2 (two) times daily.   spironolactone 25 MG tablet Commonly known as:  ALDACTONE Take 1 tablet (25 mg total) by mouth 2 (two) times daily.   torsemide 20 MG tablet Commonly known as:  DEMADEX Take 2 tablets (40 mg total) by mouth daily.           Objective:   Physical Exam  Telephonic Visit      Assessment & Plan:   1. Type 2 diabetes mellitus without complication, with long-term current use of insulin ([place]) Patient is checking FSBS at home and reports that sugars are stable.  Check in 3 Months: - Comprehensive metabolic panel; Future - CBC; Future - Hemoglobin A1c; Future  2. Chest pain, unspecified type Pt went to ED for chest pain in early April 2020, no significant findings reported.

## 2019-01-25 ENCOUNTER — Telehealth: Payer: Self-pay | Admitting: Pharmacist

## 2019-01-25 NOTE — Telephone Encounter (Signed)
01/25/2019 9:21:59 AM - [name] & tips refill/dose change  01/25/2019 Faxed [name] a refill request for Dose Change on [name] & tips Inject 24 units daily at bedtime.[name]

## 2019-02-10 ENCOUNTER — Telehealth: Payer: Self-pay | Admitting: Pharmacist

## 2019-02-10 NOTE — Telephone Encounter (Signed)
02/10/2019 9:53:47 AM - [name] & tips call to [place]  02/10/2019 I received a voicemail from [employer] requesting a call back on this patient, ref ID# [id number]. I called and spoke with [place]-she reviewed and stated just need to verify that the signature on application matched the provider on the application--Elizabeth. I verified and she sent through for processing, allow 10-14 business days to receive. Patient enrollment ends 05/16/2019, can re enroll as early as 04/15/2019.[name]

## 2019-02-12 IMAGING — CR DG CHEST 2V
1 series · 2 of 2 positions shown · non-contrast
Comparison: Radiographs June 30, 2017.

CLINICAL DATA: Chest pain.

EXAM:
CHEST  2 VIEW

[Series 1: dg chest 2 view · 0.14mm/px · 2 of 2 slices shown]
[im 1/2]
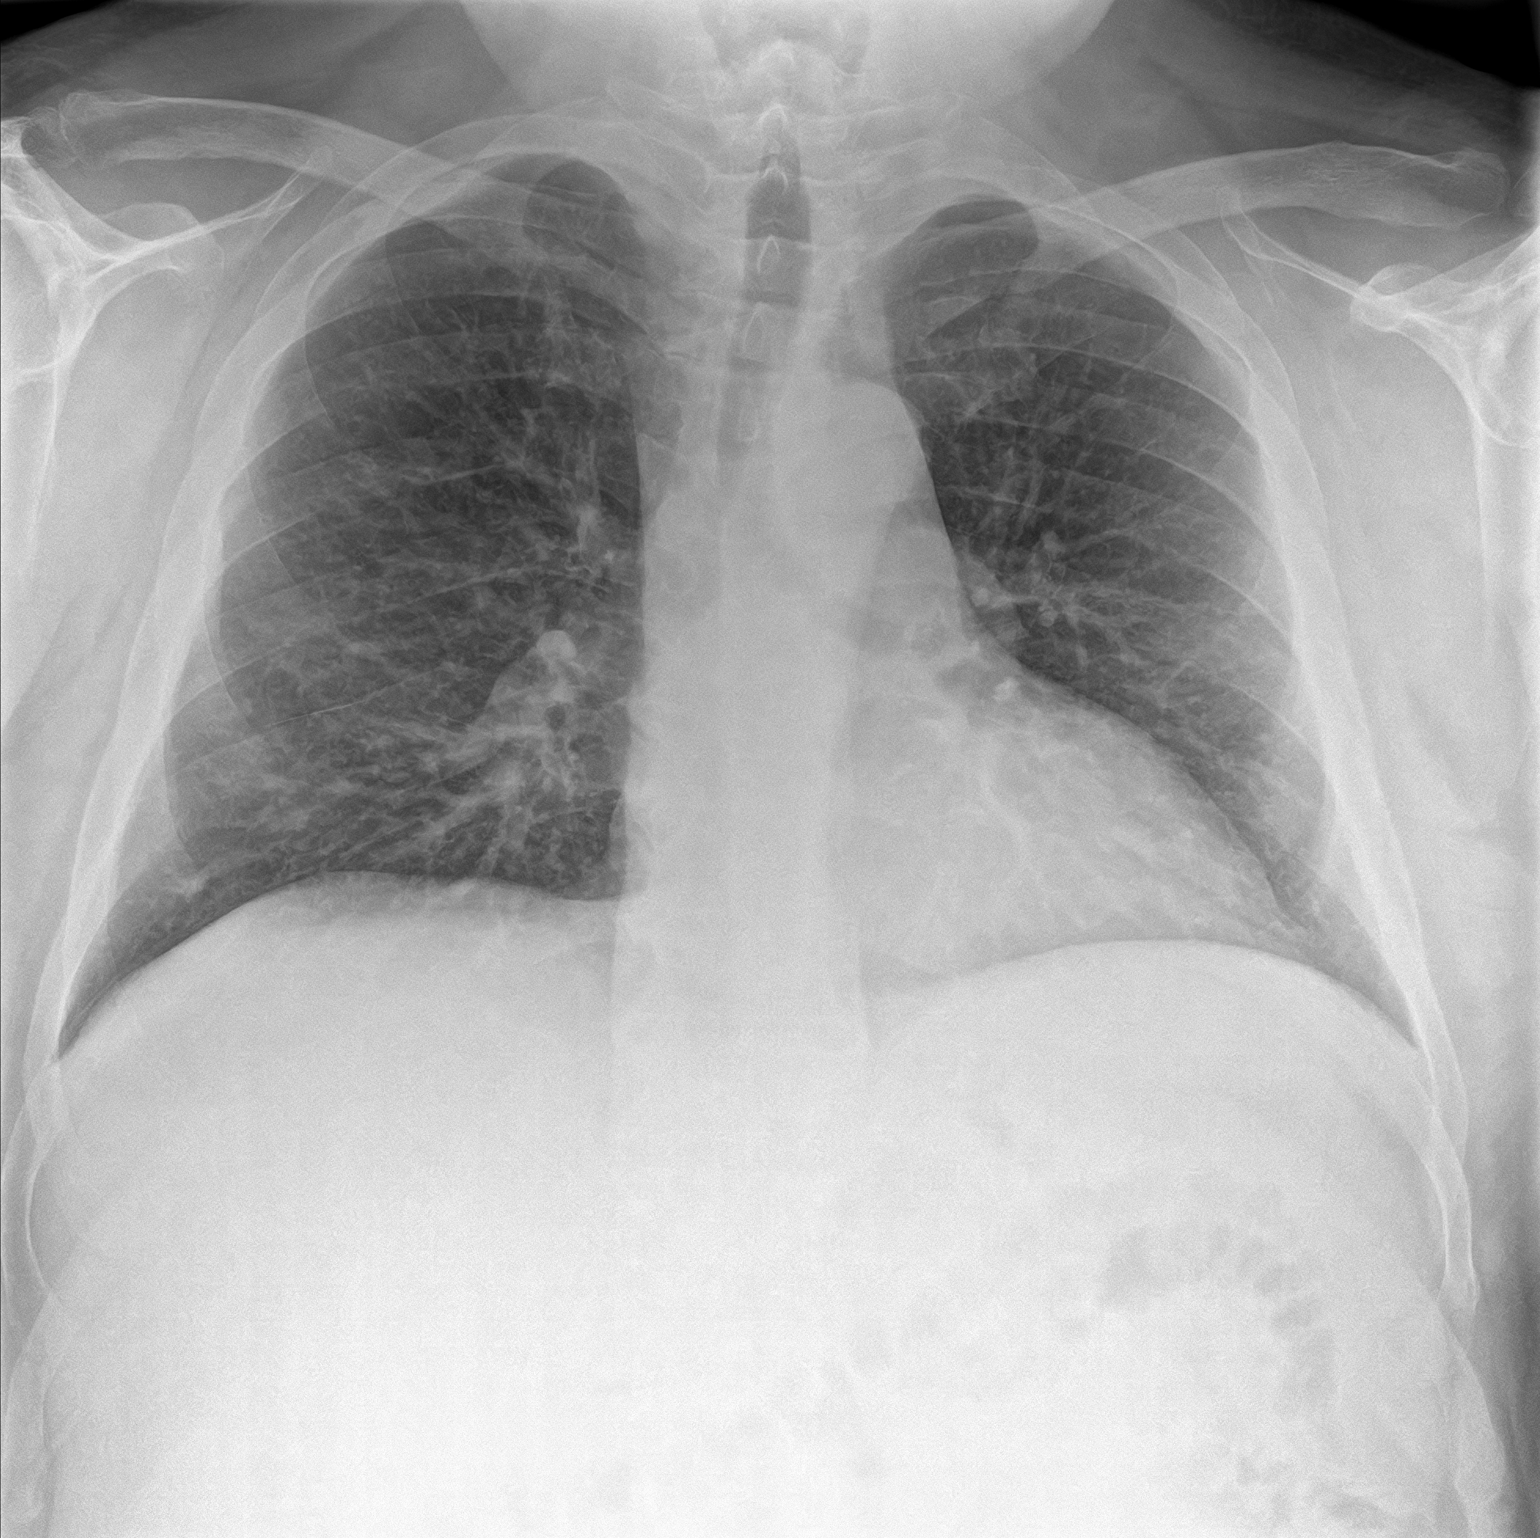
[im 2/2]
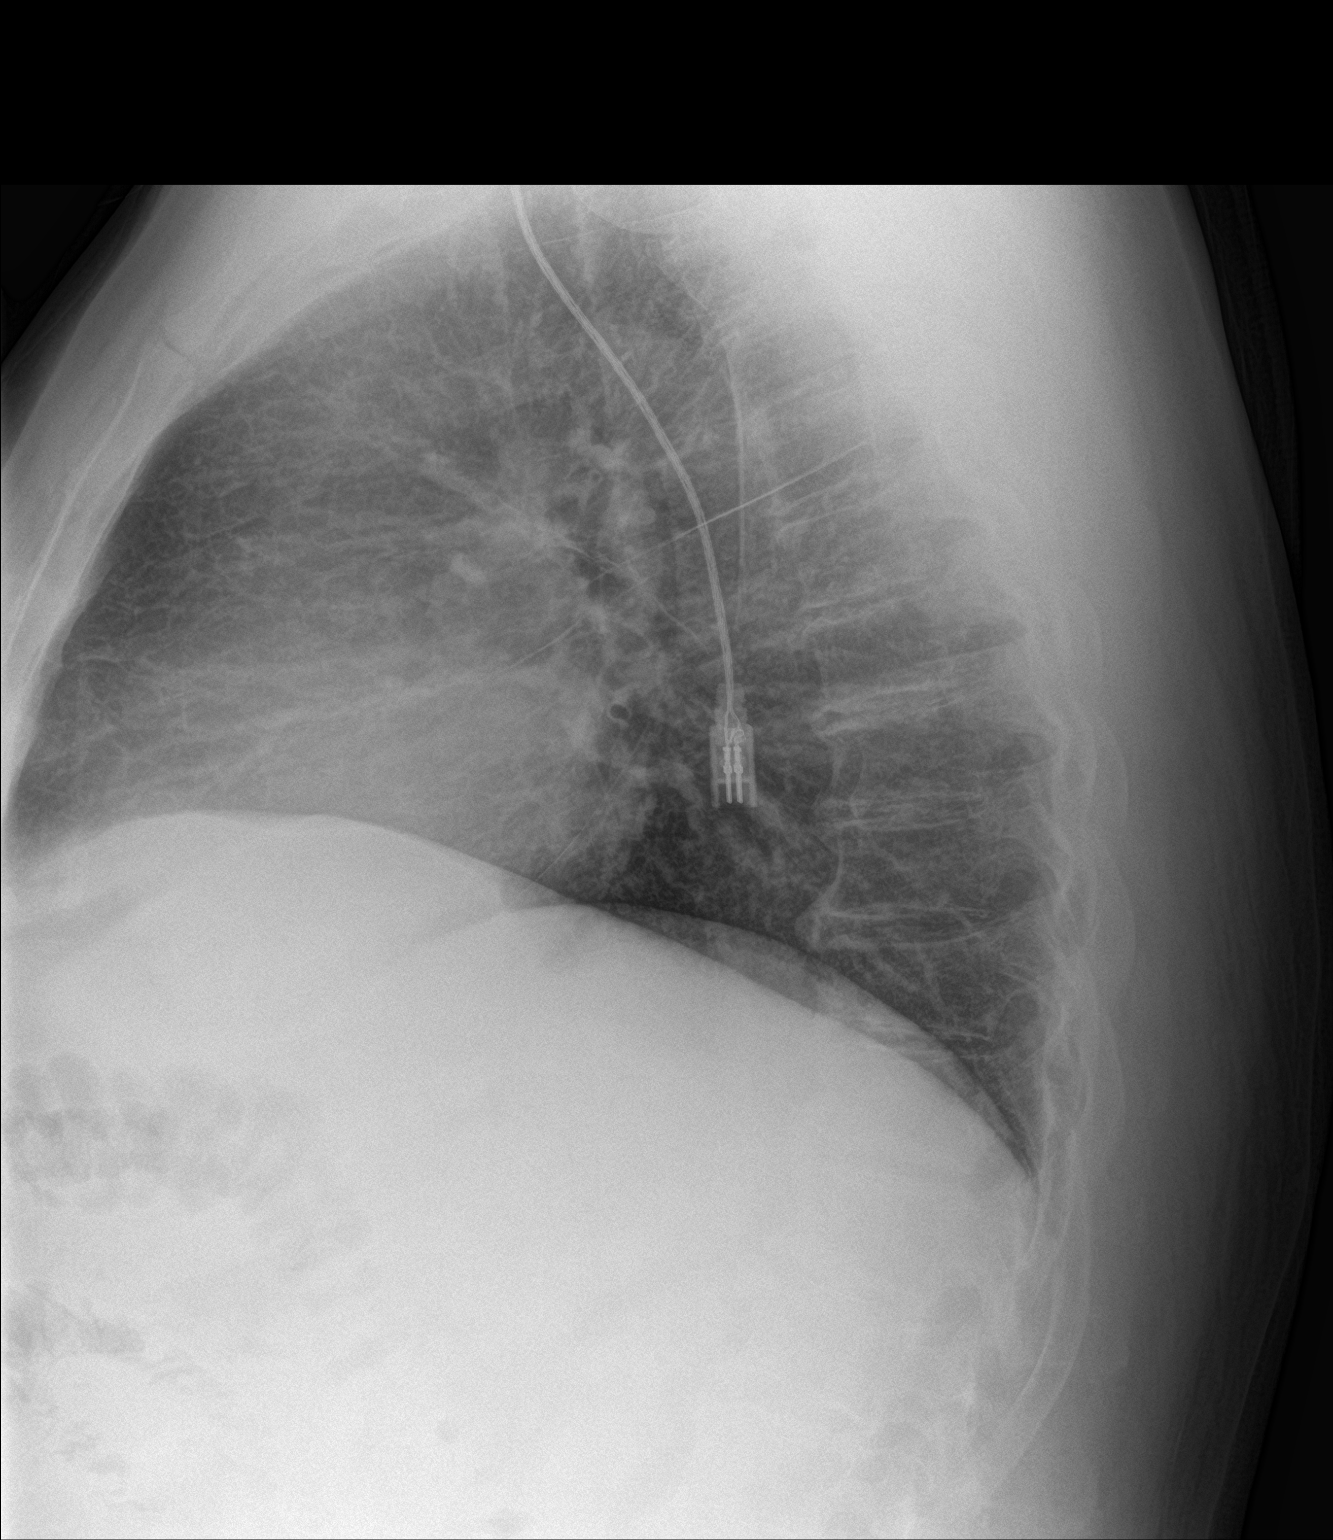

[2 of 2 positions shown; findings below may reference images not displayed]

FINDINGS: The heart size and mediastinal contours are within normal limits.
Both lungs are clear. No pneumothorax or pleural effusion is noted.
The visualized skeletal structures are unremarkable.
IMPRESSION: No active cardiopulmonary disease.

## 2019-02-14 IMAGING — CT CT ANGIO CHEST
2 of 6 series · 16 of 46 positions shown · IV contrast (APPLIED)
Comparison: Chest radiograph - 08/21/2017; chest CT - 01/28/2017 ;
03/20/2015

CLINICAL DATA: Increased shortness of breath and chest tightness
over the past several days. Evaluate pulmonary embolism.

EXAM:
CT ANGIOGRAPHY CHEST WITH CONTRAST
TECHNIQUE: Multidetector CT imaging of the chest was performed using the
standard protocol during bolus administration of intravenous
contrast. Multiplanar CT image reconstructions and MIPs were
obtained to evaluate the vascular anatomy.
CONTRAST:  75mL 1KQULP-29U IOPAMIDOL (1KQULP-29U) INJECTION 76%

[Series 5: thins · axial · 0.92mm/px · z∈[+278,+536]mm · 13 of 284 slices shown]
[im 13/284  lung]
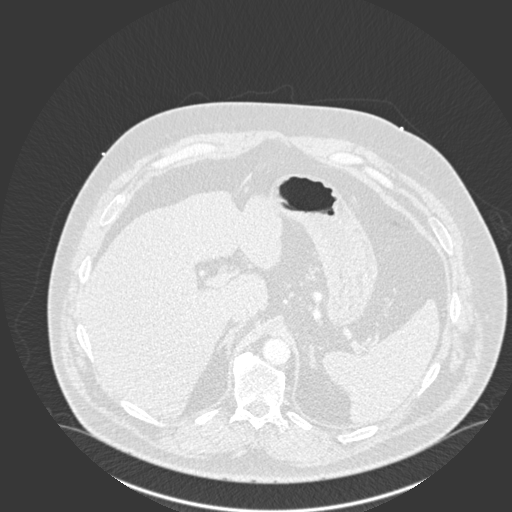
[im 37/284  soft-tissue]
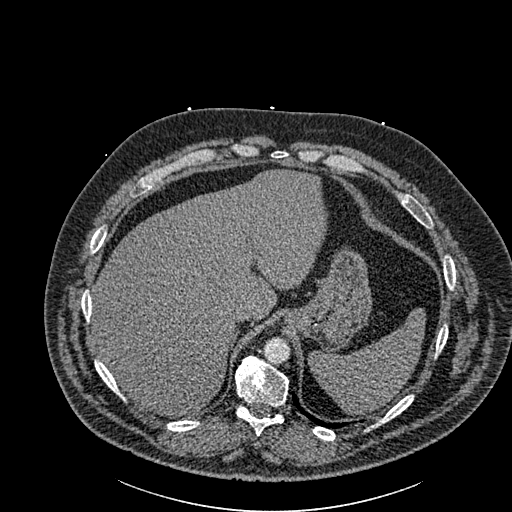
[im 62/284  lung]
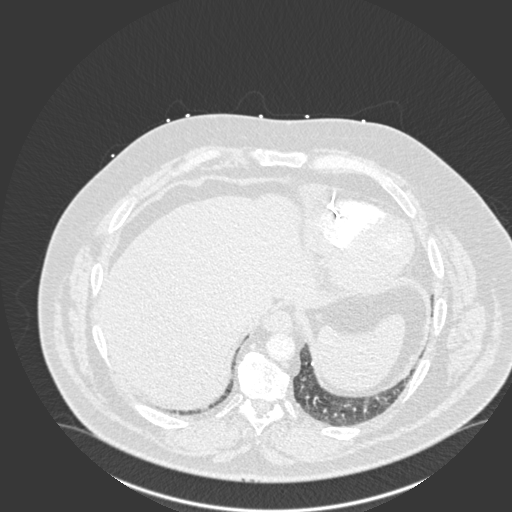
[im 74/284  soft-tissue]
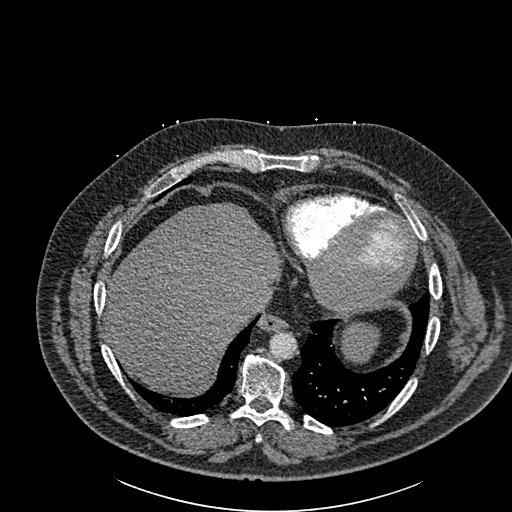
[im 99/284  lung]
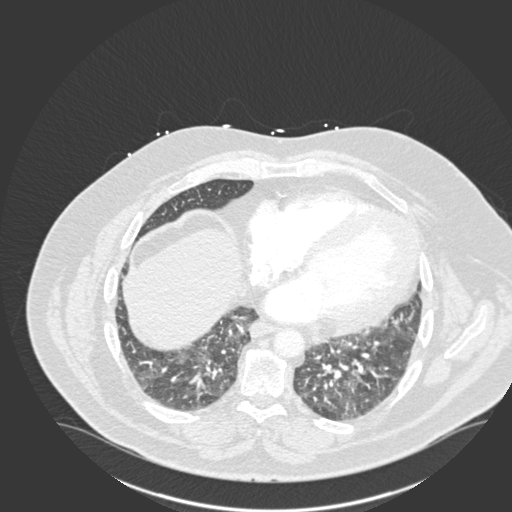
[im 124/284  soft-tissue]
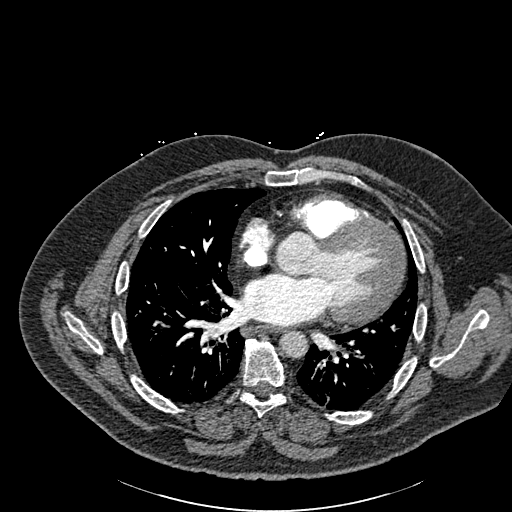
[im 148/284  lung]
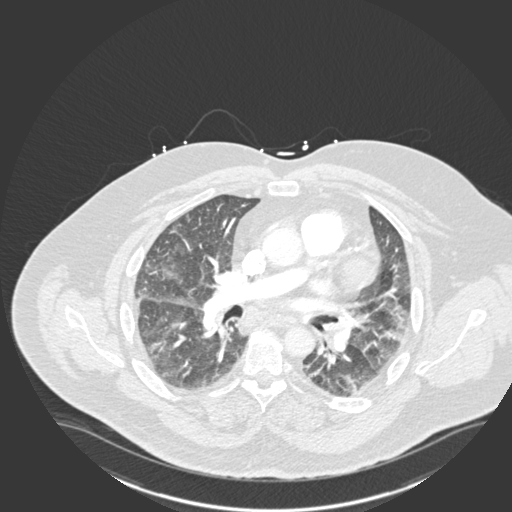
[im 160/284  soft-tissue]
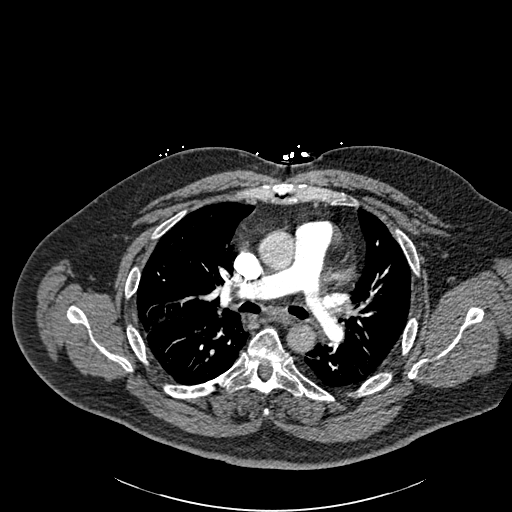
[im 185/284  lung]
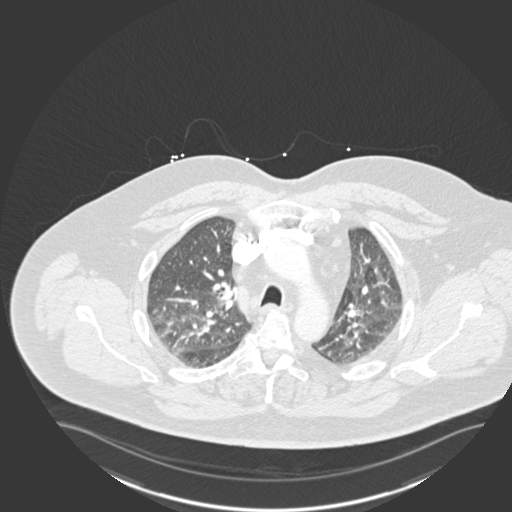
[im 210/284  soft-tissue]
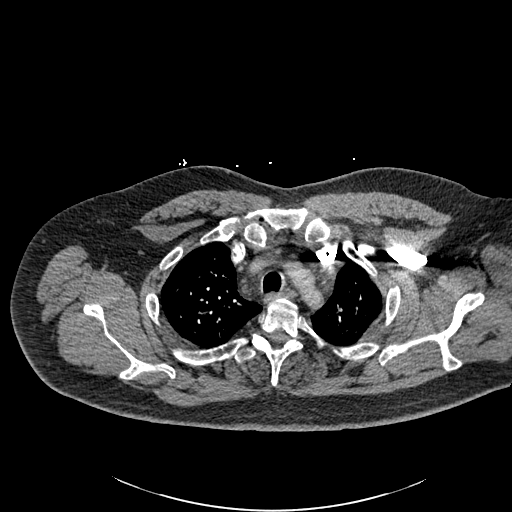
[im 222/284  lung]
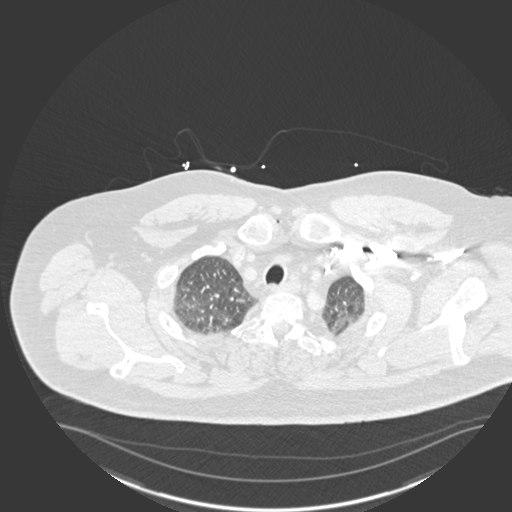
[im 247/284  soft-tissue]
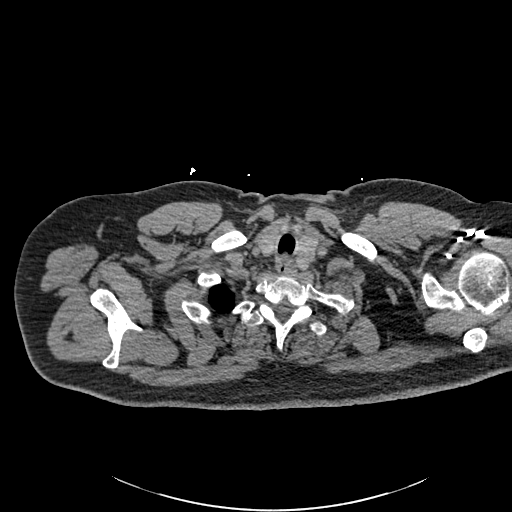
[im 271/284  lung]
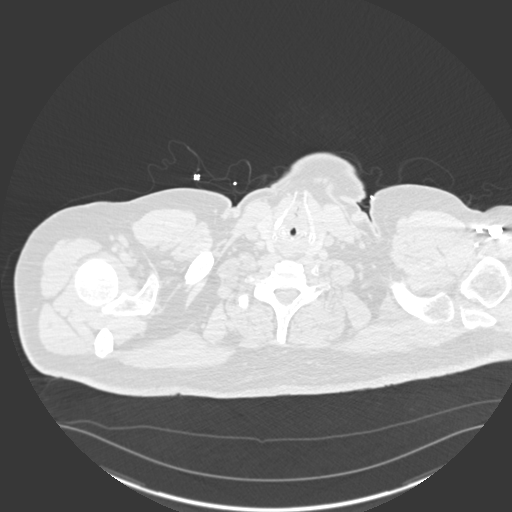

[Series 7: coronal mpr · coronal · 0.55mm/px · 3 of 104 slices shown]
[im 26/104  soft-tissue]
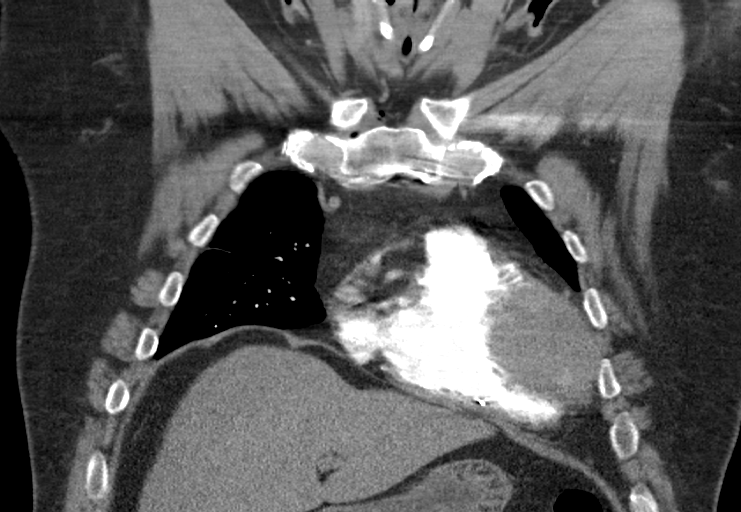
[im 52/104  soft-tissue]
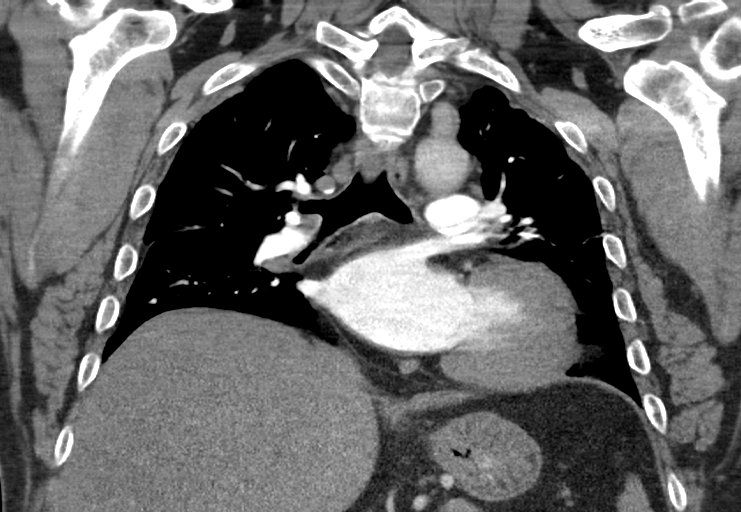
[im 78/104  soft-tissue]
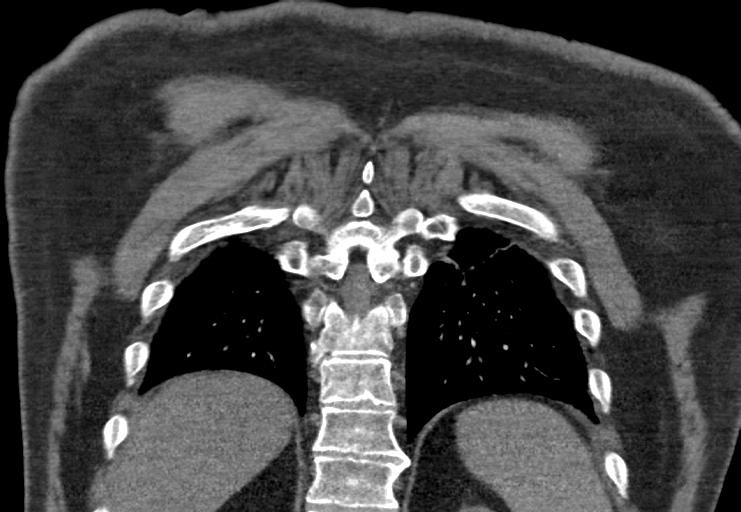

[16 of 46 positions shown; findings below may reference images not displayed]

FINDINGS: Vascular Findings:

There is adequate opacification of the pulmonary arterial system
with the main pulmonary artery measuring 320 Hounsfield units. No
discrete filling defects are seen within the pulmonary arterial tree
to suggest pulmonary embolism. Normal caliber of the main pulmonary
artery.

Cardiomegaly. Coronary artery calcifications. No pericardial
effusion.

Normal caliber the thoracic aorta. Conventional configuration of the
aortic arch. The branch vessels of the aortic arch appear patent
throughout their imaged course.

Review of the MIP images confirms the above findings.

----------------------------------------------------------------------------------

Nonvascular Findings:

Mediastinum/Lymph Nodes: Scattered mediastinal lymph nodes are
numerous though individually not enlarged by size criteria with
index right paratracheal lymph node measuring 0.9 cm in greatest
short axis diameter (image 24, series 4), index right paratracheal
lymph node measuring 1 cm (image 33), index precarinal lymph node
measuring 0.9 cm (image 36), index right suprahilar lymph node
measuring 0.9 cm (image 41) and index prevascular lymph node
measures 0.8 cm (image 33), improved compared to prior examinations.
No bulky or worsening mediastinal, hilar or axillary
lymphadenopathy.

Lungs/Pleura: Evaluation of the pulmonary parenchyma is degraded
secondary to patient respiratory artifact.

Scattered ill-defined areas of ground-glass may represent air
trapping. No discrete focal airspace opacities. No pleural effusion
or pneumothorax. The central pulmonary airways appear widely patent

No discrete pulmonary nodules given limitation of the examination.

Upper abdomen: Limited early arterial phase evaluation of the upper
abdomen demonstrates mild nodularity hepatic contour as could be
seen in the setting of early cirrhotic change.

Musculoskeletal: She no acute or aggressive osseous abnormalities.
Stigmata of DISH within the thoracic spine.

Regional soft tissues appear normal. Normal appearance of the
thyroid gland.
IMPRESSION: 1. Suspected mild airways disease. Otherwise, no explanation for
patient's chest pain and shortness of breath. Specifically, no
evidence of pulmonary embolism or focal airspace opacities to
suggest pneumonia.
2. Improved mediastinal and hilar adenopathy.
3. Mild nodularity hepatic contour could be indicative of early
cirrhotic change. Further evaluation with LFTs could be performed as
indicated.
4. Coronary artery calcifications.
5. Aortic Atherosclerosis ([id number]-95Z.Z).

## 2019-02-24 ENCOUNTER — Other Ambulatory Visit: Payer: Self-pay | Admitting: Internal Medicine

## 2019-02-24 DIAGNOSIS — I10 Essential (primary) hypertension: Secondary | ICD-10-CM

## 2019-03-03 ENCOUNTER — Other Ambulatory Visit: Payer: Self-pay | Admitting: Family

## 2019-03-03 DIAGNOSIS — I10 Essential (primary) hypertension: Secondary | ICD-10-CM

## 2019-03-03 MED ORDER — TORSEMIDE 20 MG PO TABS: 40.0000 mg | ORAL_TABLET | Freq: Every day | ORAL | 3 refills | Status: DC

## 2019-03-14 NOTE — Progress Notes (Deleted)
Patient ID: Gabriel Pertuit., male    DOB: [birth year], 48 y.o.   MRN: [id number]  HPI  Gabriel [name] is a 48 y/o male with a history of HTN, diabetes, COPD, CKD, asthma, previous tobacco use and chronic heart failure.   Most recent echo done 04/29/17 showed EF 45-50%. Echo report that was done 11/28/16 and it showed an EF of 40-45% along with mild Gabriel. EF has improved from 30-35% July 2017.  Was in the ED 11/19/2018 due to bronchitis where he was treated and released.   He presents today for a follow-up visit with a chief complaint of   Past Medical History:  Diagnosis Date  . Chest wall pain, chronic   . Chronic systolic CHF (congestive heart failure) ([place])    a. 03/2015 Echo: EF 45-50%; b. 12/2015 Echo: EF 20-25%; c. 02/2016 Echo: EF 30-35%; d. 11/2016 Echo: EF 40-45%.  . Chronic Troponin Elevation   . CKD (chronic kidney disease), stage III ([place])   . COPD (chronic obstructive pulmonary disease) ([place])   . Diabetes mellitus without complication ([place])   . Hypertension    Resolved since weight loss  . Myocardial infarction ([place])   . NICM (nonischemic cardiomyopathy) ([place])    a. 03/2015 Echo: EF 45-50%; b. 04/2015 MV: small defect of mild severity in apex 2/2 apical thinning, EF 30-44%;  c. 12/2015 Echo: EF 20-25%; d. 02/2016 Echo: EF 30-35%; e. 11/2016 Echo: EF 40-45%, diff HK, mildly dil Ao root, mild Gabriel, mod dil LA;  f. 12/2016 Cath: LM nl, LAD/Diags/LCX/OMs min irregs, RCA 40p/m/d.  . Non-obstructive CAD (coronary artery disease)    a. 04/2015 low risk MV;  b. 12/2016 Cath: minor irregs in LAD/Diag/LCX/OM, RCA 40p/m/d.  [name] (nonsustained ventricular tachycardia) ([place])    a. 12/2015 noted on tele-->amio;  b. 12/2015 Event monitor: no VT. Atach noted.  . Obesity (BMI 30.0-34.9)   . Psoriasis   . Renal insufficiency   . Syncope    a. 01/2016 - felt to be vasovagal.  . Tobacco abuse    Past Surgical History:  Procedure Laterality Date  . AMPUTATION    . CARDIAC CATHETERIZATION    . FINGER  AMPUTATION     Traumatic  . FINGER FRACTURE SURGERY Left   . LEFT HEART CATH AND CORONARY ANGIOGRAPHY N/A 01/06/2017   Procedure: Left Heart Cath and Coronary Angiography;  Surgeon: Wellington Hampshire, MD;  Location: [facility];  Service: Cardiovascular;  Laterality: N/A;   Family History  Problem Relation Age of Onset  . Diabetes Mellitus II Mother   . Hypertension Father   . Cancer Paternal Aunt   . Cancer Maternal Grandfather   . Diabetes Maternal Grandfather    Social History   Tobacco Use  . Smoking status: Former Smoker    Packs/day: 1.00    Years: 33.00    Pack years: 33.00    Types: Cigarettes  . Smokeless tobacco: Never Used  . Tobacco comment: Quit a little over a month ago.  Substance Use Topics  . Alcohol use: No    Alcohol/week: 0.0 standard drinks    Frequency: Never    Comment: occassionally   Allergies  Allergen Reactions  . Prednisone Other (See Comments)    Reaction: Hallucinations    . Zantac [Ranitidine Hcl] Diarrhea and Nausea Only    Night sweats      Review of Systems  Constitutional: Positive for fatigue. Negative for appetite change.  HENT: Positive for  congestion. Negative for postnasal drip and sore throat.   Eyes: Negative.   Respiratory: Positive for shortness of breath (at times). Negative for cough and chest tightness.   Cardiovascular: Positive for chest pain ("just the usual") and palpitations. Negative for leg swelling.  Gastrointestinal: Negative for abdominal distention and abdominal pain.  Endocrine: Negative.   Genitourinary: Negative.   Musculoskeletal: Positive for back pain.  Skin: Negative.   Allergic/Immunologic: Negative.   Neurological: Positive for light-headedness (some days), numbness (down both legs at times) and headaches (constant pressure on top of the head). Negative for dizziness.  Hematological: Negative for adenopathy. Does not bruise/bleed easily.  Psychiatric/Behavioral: Positive for sleep  disturbance (due to pain down his legs). Negative for dysphoric mood and suicidal ideas. The patient is not nervous/anxious.      Physical Exam  Constitutional: He is oriented to person, place, and time. He appears well-developed and well-nourished.  HENT:  Head: Normocephalic and atraumatic.  Neck: Normal range of motion. Neck supple. No JVD present.  Cardiovascular: Normal rate and regular rhythm.  Pulmonary/Chest: Effort normal. He has no wheezes. He has no rales. He exhibits no tenderness.  Abdominal: Soft. He exhibits no distension. There is no abdominal tenderness.  Musculoskeletal:        General: No tenderness or edema.  Neurological: He is alert and oriented to person, place, and time.  Skin: Skin is warm and dry.  Psychiatric: He has a normal mood and affect. His behavior is normal. Thought content normal.  Nursing note and vitals reviewed.   Assessment & Plan:  1: Chronic heart failure with reduced ejection fraction- - NYHA class II - euvolemic today - weighing daily and reminded to call for an overnight weight gain of >2 pounds or a weekly weight gain of >5 pounds.  - weight 245.2 from last visit 6 months ago - not adding salt and he was encouraged to closely follow a 2000mg  sodium diet  - saw cardiologist Rockey Situ) 01/05/18  - BNP from 04/28/17 was 25.0 - patient develops angina upon exertion and, therefore, is unable to work  2: HTN- - BP  - saw PCP @ [facility] 01/06/2019  - BMP from 11/19/2018 reviewed and showed sodium 138, potassium 3.7, creatinine 1.22 and GFR >60  3: Diabetes-   -  A1c 8.0% on 09/02/2018 - says glucose at home has been running  - followed by [facility]   Patient did not bring his medications nor a list. Each medication was verbally reviewed with the patient and he was encouraged to bring the bottles to every visit to confirm accuracy of list.

## 2019-03-15 ENCOUNTER — Ambulatory Visit: Payer: Medicaid Other | Admitting: Family

## 2019-03-19 ENCOUNTER — Telehealth: Payer: Self-pay | Admitting: Pharmacist

## 2019-03-19 NOTE — Telephone Encounter (Signed)
03/19/2019 9:17:18 AM - [name] refill  03/19/2019 I called [employer] to refill [name] 97/103, per Merrily Pew they will deliver Tues, August 4. patient has 1 refill left.[name]

## 2019-03-24 IMAGING — CT CT ABD-PELV W/ CM
2 of 5 series · 14 of 36 positions shown, 17 images · IV contrast (iopamidol)
Comparison: None.

CLINICAL DATA: Chest pain x2 days with cough. Left upper quadrant
abdominal pain as well.

EXAM:
CT CHEST, ABDOMEN, AND PELVIS WITH CONTRAST
TECHNIQUE: Multidetector CT imaging of the chest, abdomen and pelvis was
performed following the standard protocol during bolus
administration of intravenous contrast.
CONTRAST:  100mL [id number] IOPAMIDOL ([id number]) INJECTION 61%

[Series 2: cap with · axial · 0.97mm/px · z∈[-600,-15]mm · 11 of 141 slices shown, 14 images]
[im 12/141  mediastinal]
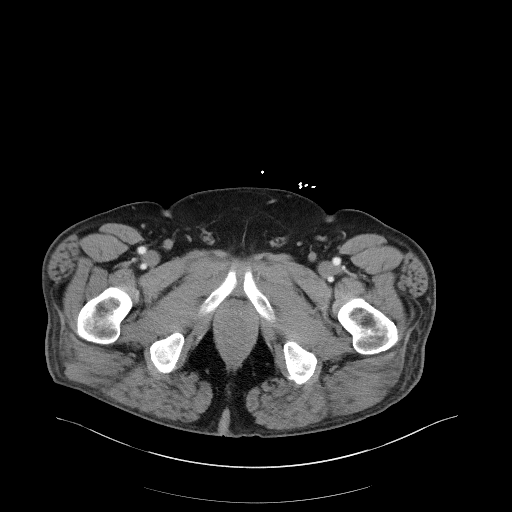
[im 12/141  lung]
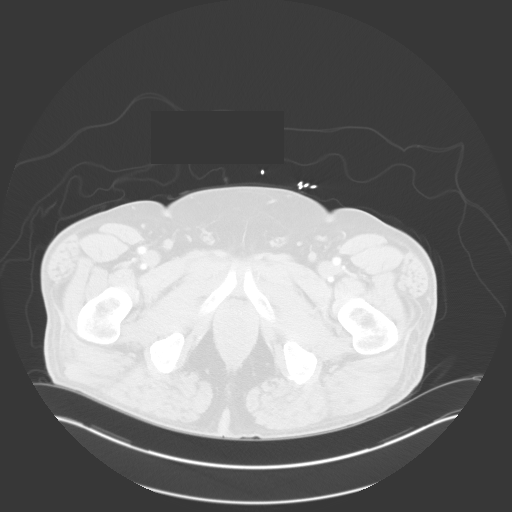
[im 24/141  lung]
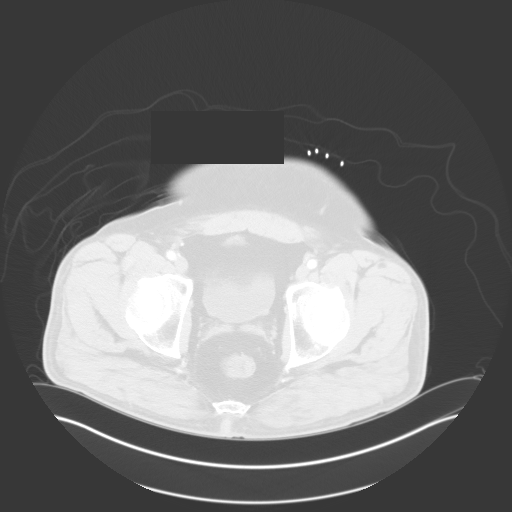
[im 36/141  lung]
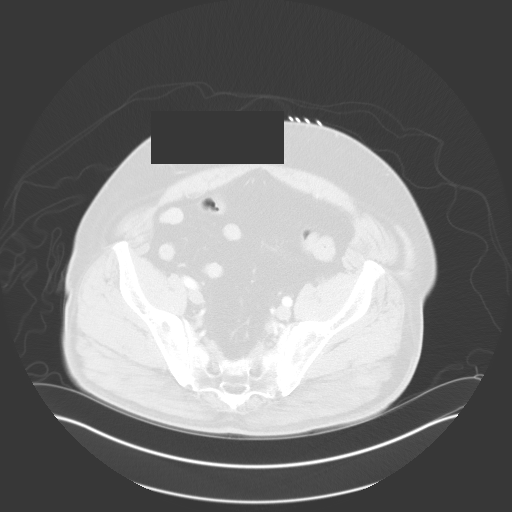
[im 47/141  lung]
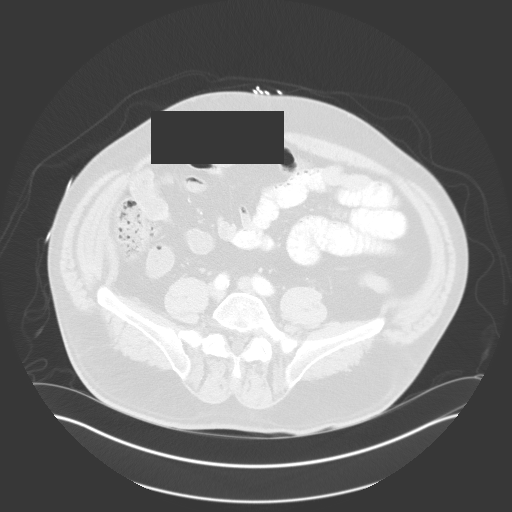
[im 59/141  mediastinal]
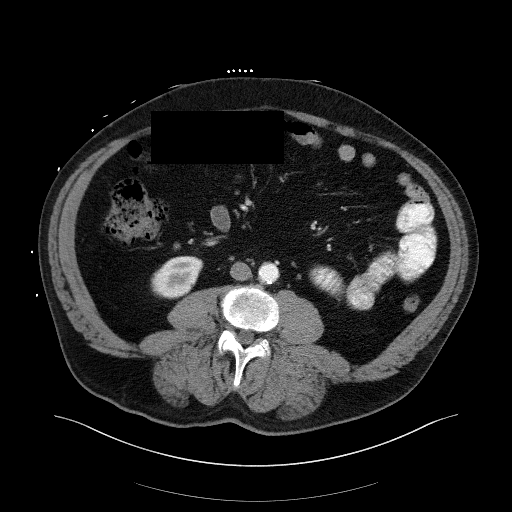
[im 59/141  lung]
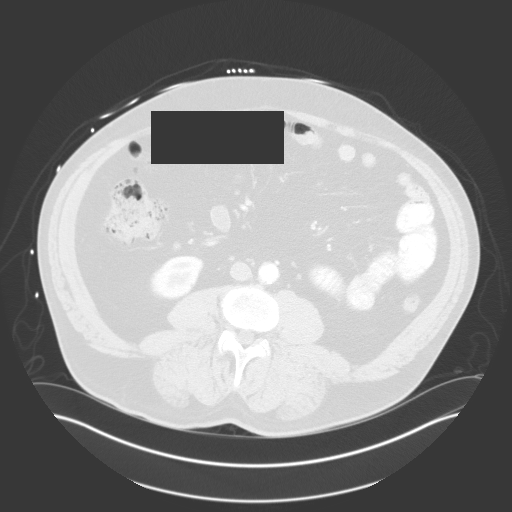
[im 71/141  lung]
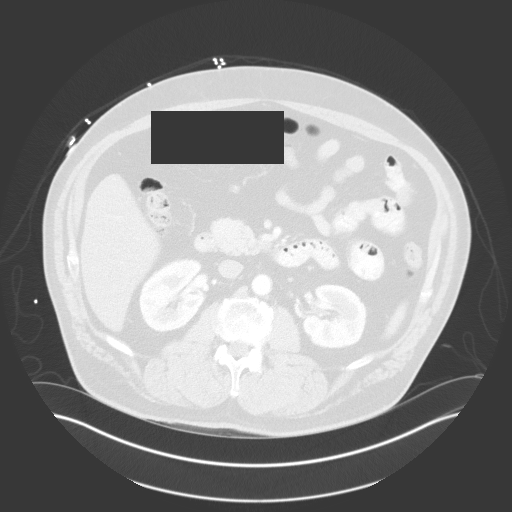
[im 82/141  lung]
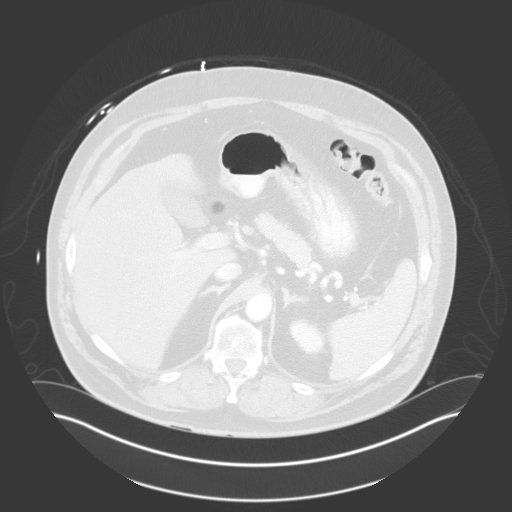
[im 94/141  lung]
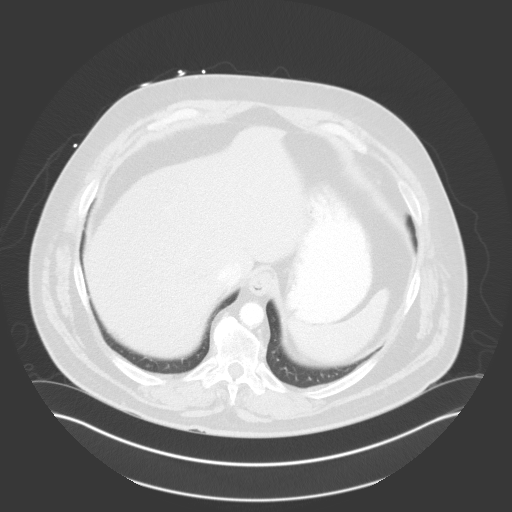
[im 106/141  mediastinal]
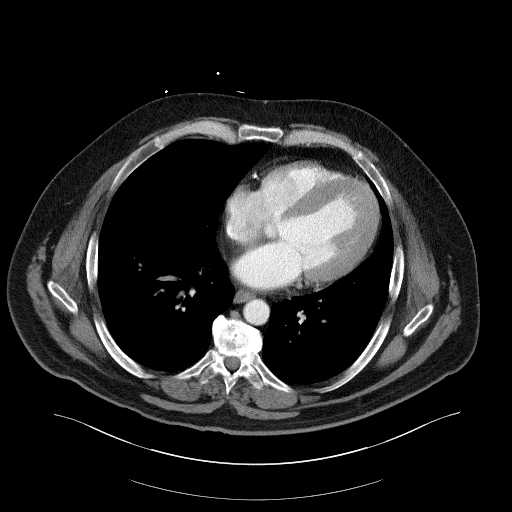
[im 106/141  lung]
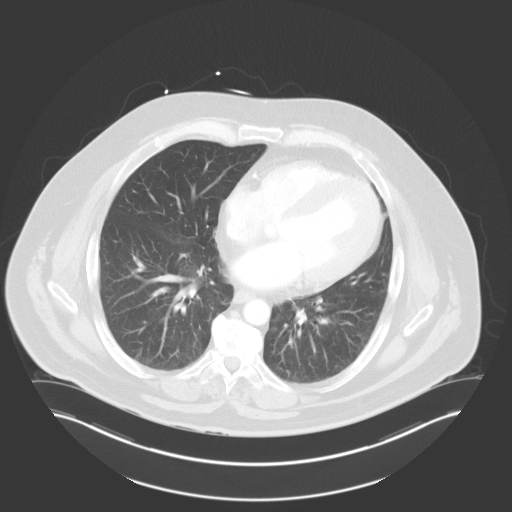
[im 117/141  lung]
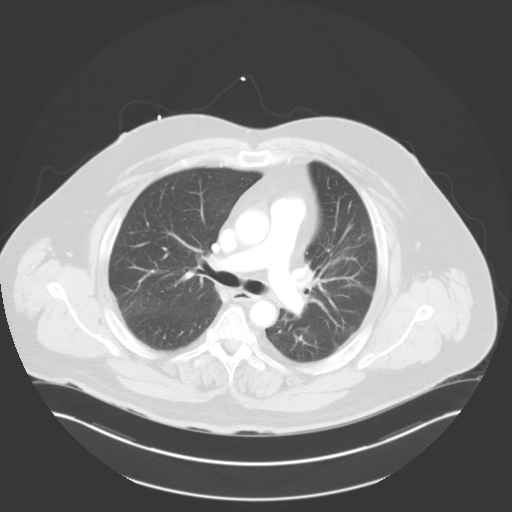
[im 129/141  lung]
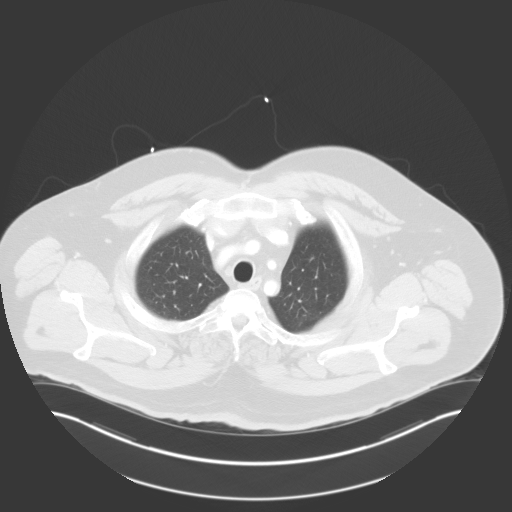

[Series 5: coronals · coronal · 0.96mm/px · 3 of 176 slices shown]
[im 36/176  lung]
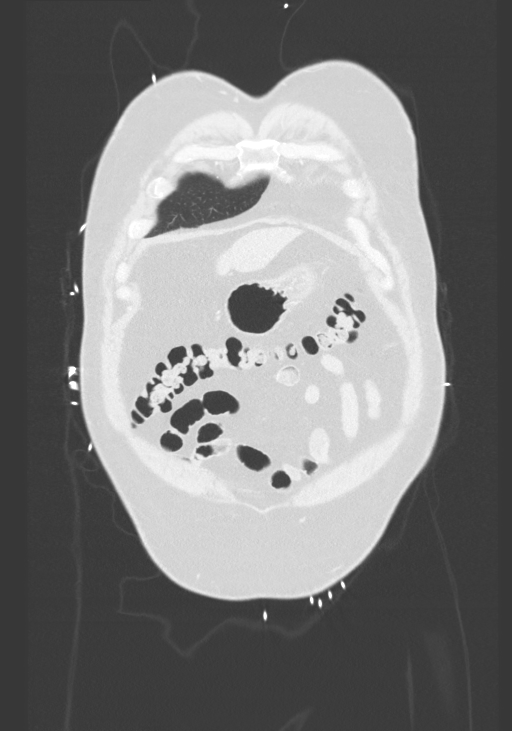
[im 71/176  lung]
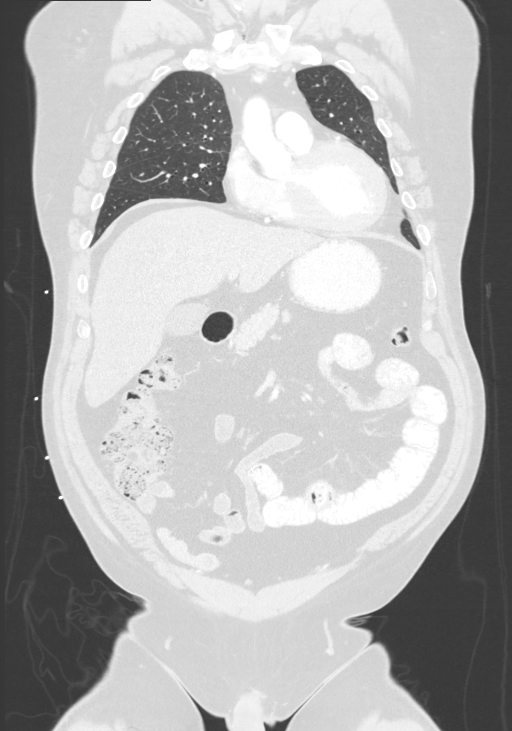
[im 106/176  lung]
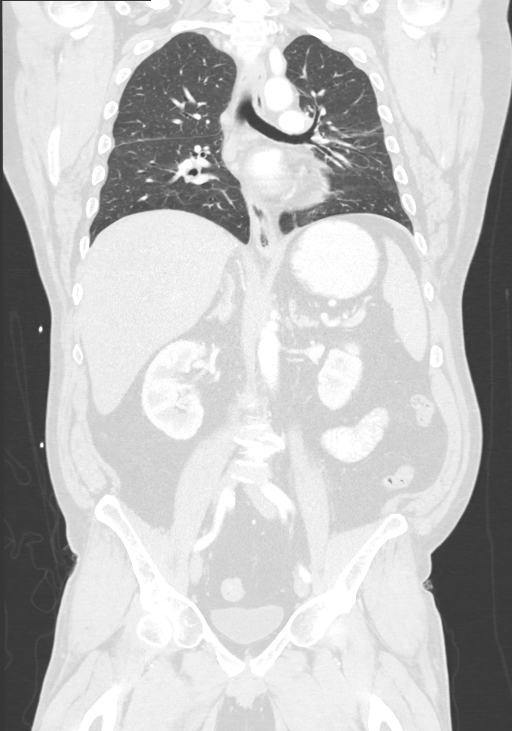

[14 of 36 positions shown; findings below may reference images not displayed]

FINDINGS: CT CHEST FINDINGS

Cardiovascular: Heart is enlarged with left main, LAD and RCA
coronary arteriosclerosis.

Mediastinum/Nodes: No thyromegaly. 5 mm cystic nodule in the right
thyroid gland without worrisome features noted. Normal branch
pattern of the great vessels. Patent midline trachea. Esophagus is
unremarkable.

Lungs/Pleura: Lungs are clear. No pleural effusion or pneumothorax.

Musculoskeletal: No chest wall mass or suspicious bone lesions
identified. Degenerate changes are noted along the dorsal spine most
evident at T12-L1 marked disc space narrowing and discogenic
endplate sclerosis.

CT ABDOMEN PELVIS FINDINGS

Hepatobiliary: No space-occupying mass of the liver. No biliary
dilatation. Physiologic distention of the gallbladder.

Pancreas: Normal

Spleen: Normal

Adrenals/Urinary Tract: Normal bilateral adrenal glands. Mild
cortical thinning in the lower pole the right kidney overlying a
small water attenuating cyst measures 1.5 cm in diameter. No
nephrolithiasis nor hydroureteronephrosis. Normal bladder.

Stomach/Bowel: The stomach is distended with ingested oral contrast.
There is normal small bowel rotation without acute bowel
inflammation or obstruction. Normal-appearing appendix is
identified. Scattered colonic diverticulosis without acute
diverticulitis more so along the sigmoid colon.

Vascular/Lymphatic: Aortoiliac atherosclerosis without aneurysm. No
lymphadenopathy.

Reproductive: Normal seminal vesicles and prostate.

Other: Prominent intrapelvic fat compatible pelvic lipomatosis.

Musculoskeletal: Lumbar degenerative disc disease compatible with
spondylitic change. This is most evident at T12-L1 with lesser
degrees of disc space narrowing at L3-4 and L4-5.
IMPRESSION: Chest CT:

1. Coronary arteriosclerosis.
2. No large central pulmonary embolus. No aortic aneurysm or
dissection.
3. No active pulmonary disease.
4. Tiny 5 mm right thyroid nodule without worrisome features. No
additional workup required per consensus guidelines given size and
lack of worrisome features.

CT abdomen and pelvis:

1. Scattered colonic diverticulosis without acute diverticulitis.
2. 1.5 cm lower pole renal cyst with mild cortical thinning.
3. Pelvic lipomatosis.
4. Lumbar spondylosis.

## 2019-03-24 IMAGING — CR DG CHEST 2V
3 series · 3 of 3 positions shown · non-contrast
Comparison: 08/21/2017

CLINICAL DATA: Chest pain, cough, sore throat, and shortness of
breath for 2 days, history CHF, COPD, hypertension, diabetes
mellitus, smoker

EXAM:
CHEST  2 VIEW

[chest pa (1 of 2)]
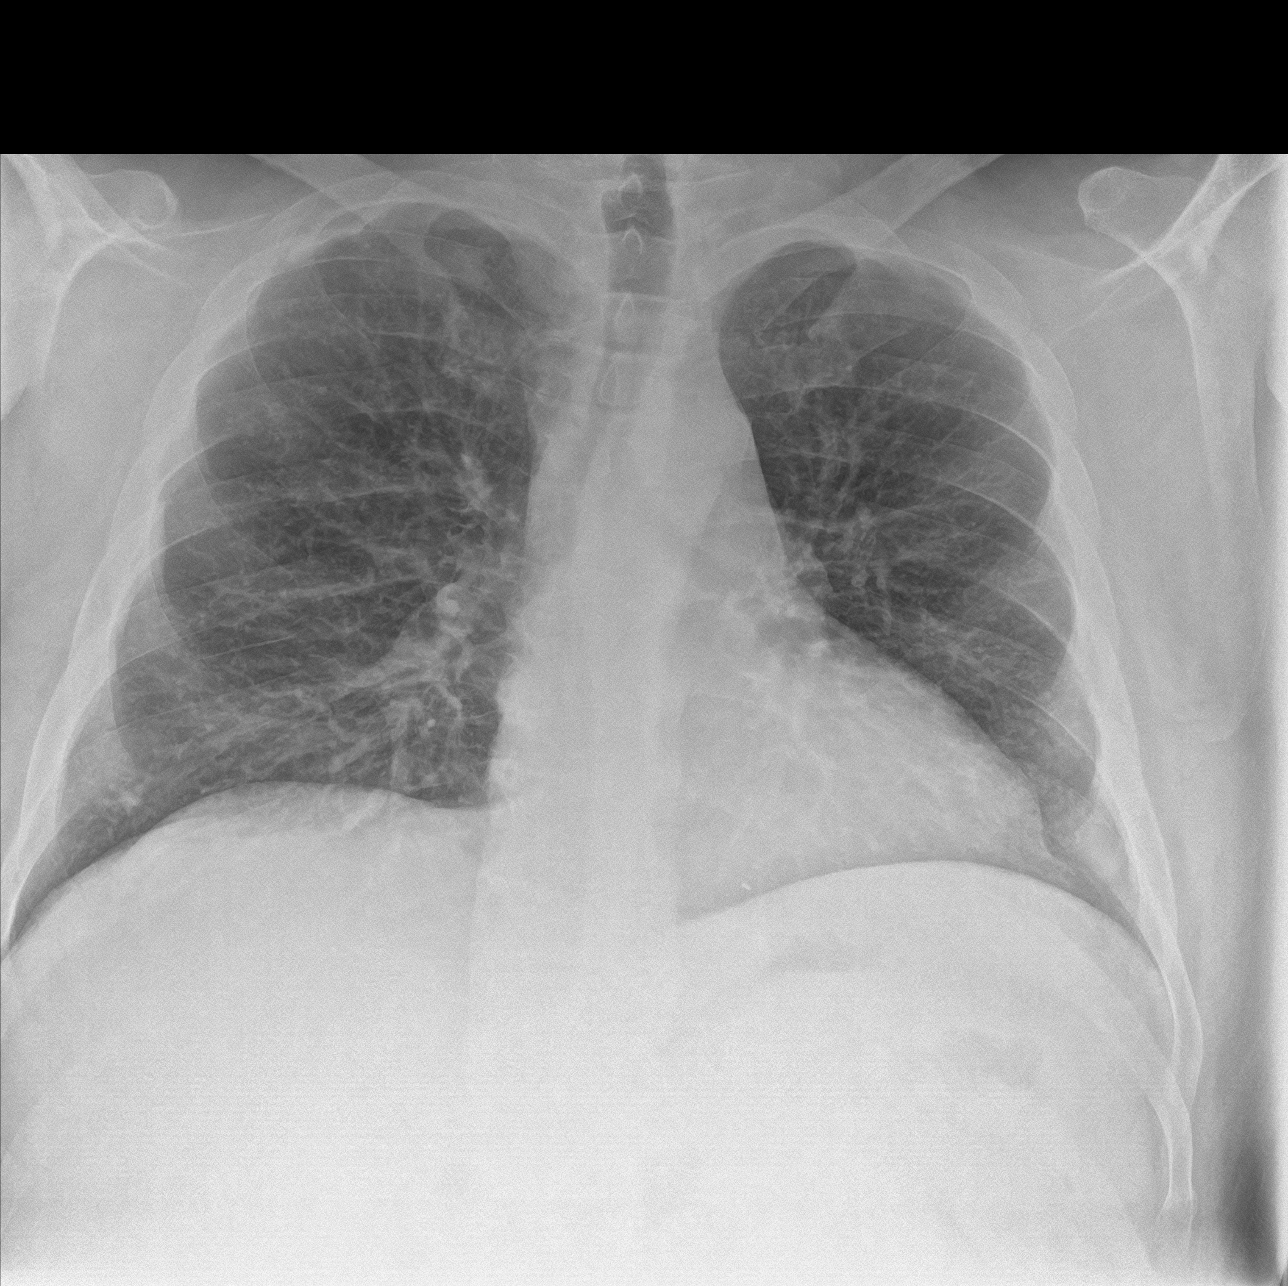

[chest lat]
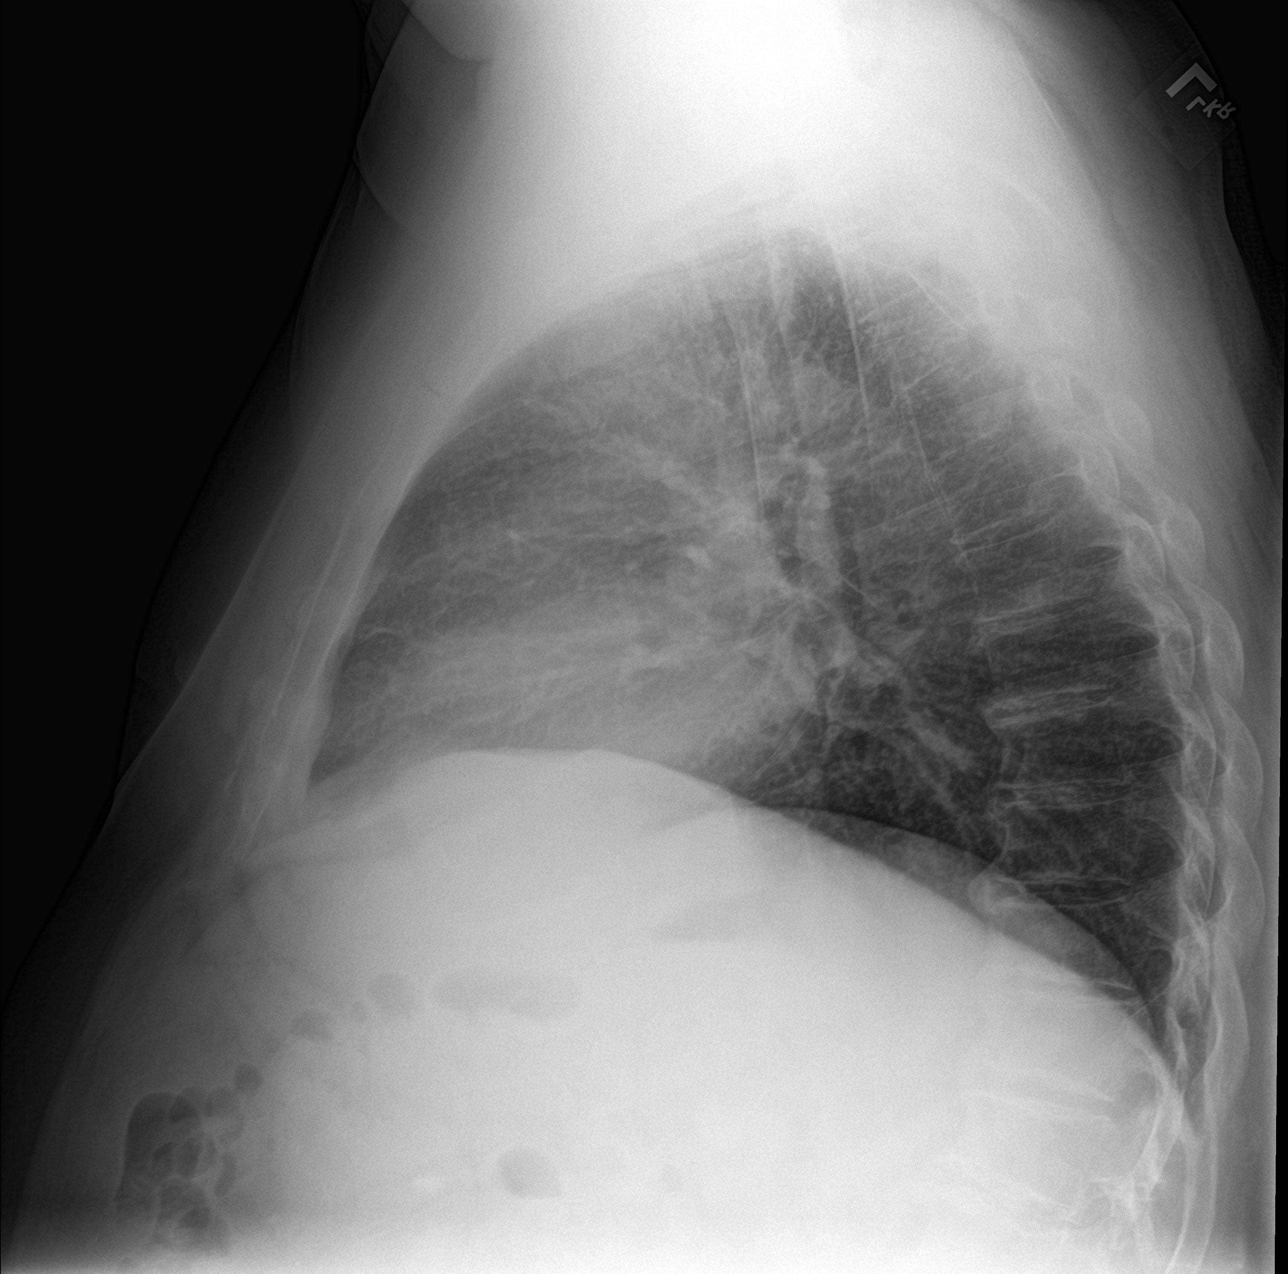

[chest pa (2 of 2)]
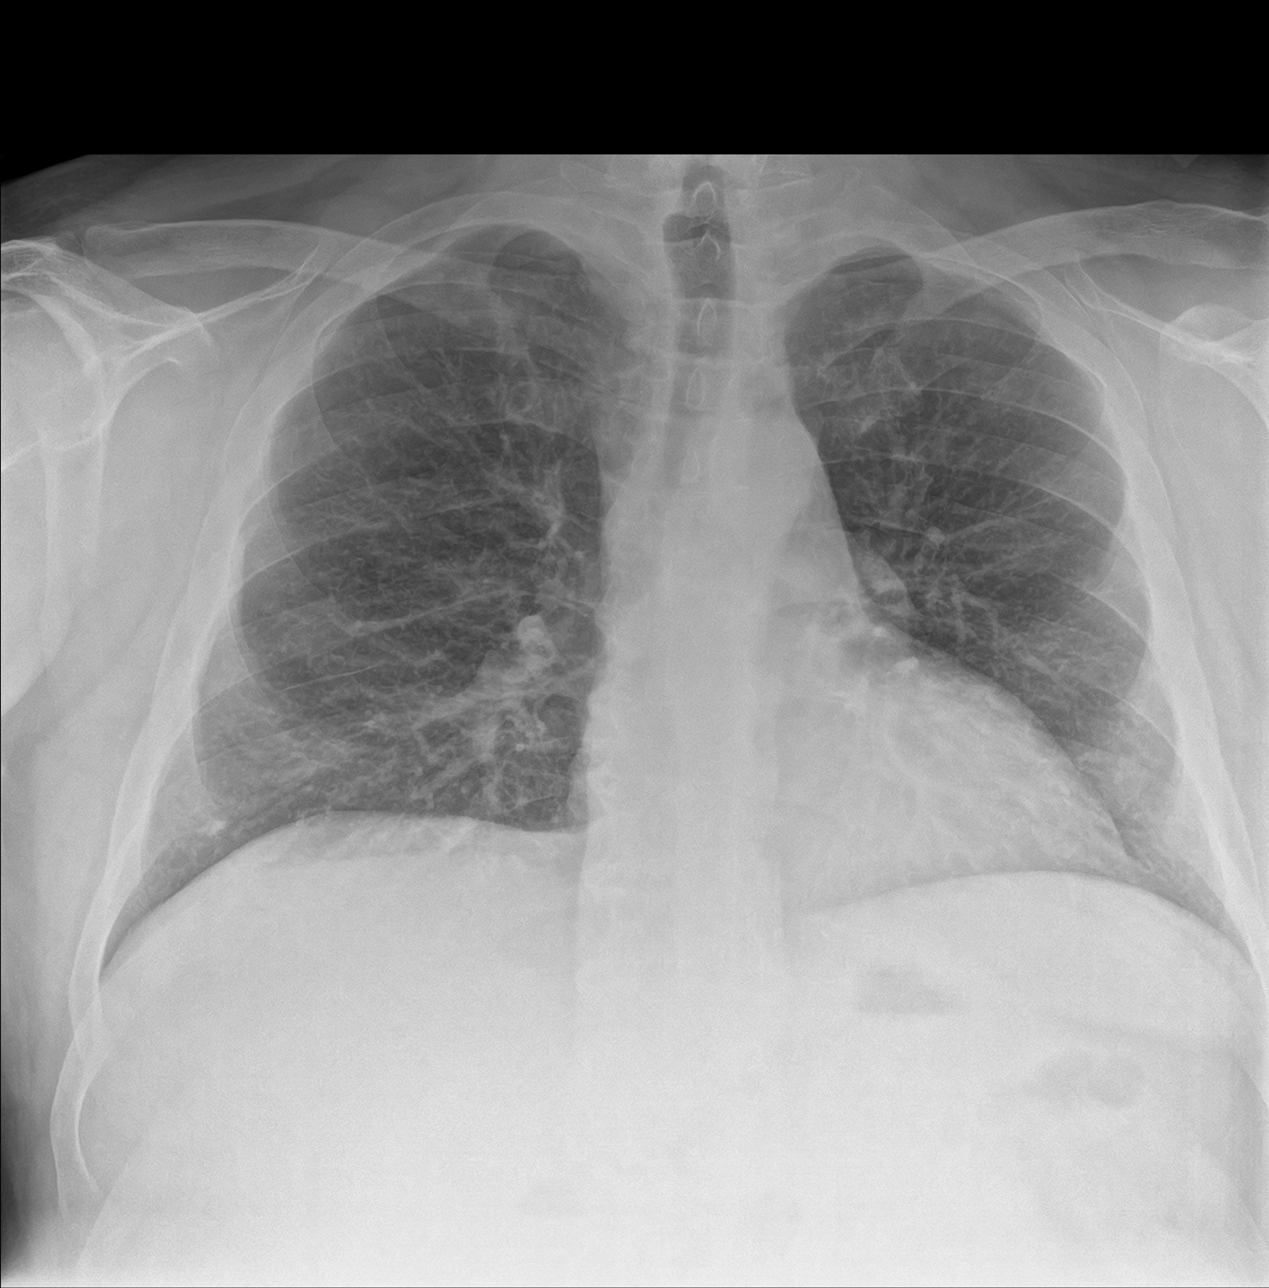

[3 of 3 positions shown; findings below may reference images not displayed]

FINDINGS: Upper normal size of cardiac silhouette.

Mediastinal contours and pulmonary vascularity normal.

Chronic bronchitic changes.

No acute infiltrate, pleural effusion or pneumothorax.

Bones unremarkable.
IMPRESSION: Chronic bronchitic changes.

No acute abnormalities.

## 2019-03-29 ENCOUNTER — Telehealth: Payer: Self-pay

## 2019-03-29 NOTE — Telephone Encounter (Signed)
Patient had called and stated he had a COVID test done and wanted [place] to call him.  Tried to call patient and his phone was unable to take calls at this time.

## 2019-03-31 ENCOUNTER — Other Ambulatory Visit: Payer: Medicaid Other

## 2019-03-31 ENCOUNTER — Other Ambulatory Visit: Payer: Self-pay

## 2019-03-31 DIAGNOSIS — E119 Type 2 diabetes mellitus without complications: Secondary | ICD-10-CM

## 2019-03-31 DIAGNOSIS — Z794 Long term (current) use of insulin: Secondary | ICD-10-CM

## 2019-04-01 LAB — COMPREHENSIVE METABOLIC PANEL
ALT: 31 IU/L (ref 0–44)
AST: 30 IU/L (ref 0–40)
Albumin/Globulin Ratio: 1.4 (ref 1.2–2.2)
Albumin: 4.2 g/dL (ref 4.0–5.0)
Alkaline Phosphatase: 93 IU/L (ref 39–117)
BUN/Creatinine Ratio: 12 (ref 9–20)
BUN: 15 mg/dL (ref 6–24)
Bilirubin Total: 1.7 mg/dL — ABNORMAL HIGH (ref 0.0–1.2)
CO2: 22 mmol/L (ref 20–29)
Calcium: 9.8 mg/dL (ref 8.7–10.2)
Chloride: 98 mmol/L (ref 96–106)
Creatinine, Ser: 1.22 mg/dL (ref 0.76–1.27)
GFR calc Af Amer: 79 mL/min/{1.73_m2} (ref >59)
GFR calc non Af Amer: 69 mL/min/{1.73_m2} (ref >59)
Globulin, Total: 3 g/dL (ref 1.5–4.5)
Glucose: 301 mg/dL — ABNORMAL HIGH (ref 65–99)
Potassium: 4.2 mmol/L (ref 3.5–5.2)
Sodium: 138 mmol/L (ref 134–144)
Total Protein: 7.2 g/dL (ref 6.0–8.5)

## 2019-04-01 LAB — CBC
Hematocrit: 56.8 % — ABNORMAL HIGH (ref 37.5–51.0)
Hemoglobin: 18.4 g/dL — ABNORMAL HIGH (ref 13.0–17.7)
MCH: 27.5 pg (ref 26.6–33.0)
MCHC: 32.4 g/dL (ref 31.5–35.7)
MCV: 85 fL (ref 79–97)
Platelets: 193 10*3/uL (ref 150–450)
RBC: 6.7 x10E6/uL — ABNORMAL HIGH (ref 4.14–5.80)
RDW: 15.3 % (ref 11.6–15.4)
WBC: 9.7 10*3/uL (ref 3.4–10.8)

## 2019-04-01 LAB — HEMOGLOBIN A1C
Est. average glucose Bld gHb Est-mCnc: 220 mg/dL
Hgb A1c MFr Bld: 9.3 % — ABNORMAL HIGH (ref 4.8–5.6)

## 2019-04-07 ENCOUNTER — Ambulatory Visit: Payer: Medicaid Other | Admitting: Internal Medicine

## 2019-04-07 ENCOUNTER — Other Ambulatory Visit: Payer: Self-pay

## 2019-04-07 DIAGNOSIS — Z794 Long term (current) use of insulin: Secondary | ICD-10-CM

## 2019-04-07 DIAGNOSIS — E119 Type 2 diabetes mellitus without complications: Secondary | ICD-10-CM

## 2019-04-07 MED ORDER — METFORMIN HCL 500 MG PO TABS: ORAL_TABLET | ORAL | 3 refills | Status: DC

## 2019-04-07 NOTE — Progress Notes (Signed)
   Subjective:    Patient ID: Gabriel [name]., male    DOB: Sep 10, 1970, 48 y.o.   MRN: [id number]  HPI  Patient is a 48 year old male presenting for a telephonic visit.  Review of Systems     Objective:   Physical Exam  No PE - telephonic visit      Assessment & Plan:   1. Diabetes A1C is elevated since last check 17mo ago, now in the 9 range. Reports morning readings in the 200-300 range and afternoon readings in the 200 range. Pt checks sugars a few times a week. Renal function looks like it could tolerate more Metformin in his program.  Plan is to increase the dose of Metformin to 1000mg  with supper and continue 500mg  with breakfast. Encouraged better complliance with diet. Encouraged checking sugars before breakfast and to alert [place] if they get under 120. Plan is to recheck status in 90 days.   Patient reportedly had a COVID test at [facility] and received a negative report.  Will FU in 56mo.

## 2019-05-10 ENCOUNTER — Telehealth: Payer: Self-pay | Admitting: Family

## 2019-05-10 IMAGING — CR DG CHEST 2V
2 series · 2 of 2 positions shown · non-contrast
Comparison: Chest radiographs and CT 09/30/2017

CLINICAL DATA: Bilateral chest pain worsening since yesterday.
Shortness of breath.

EXAM:
CHEST - 2 VIEW

[chest pa]
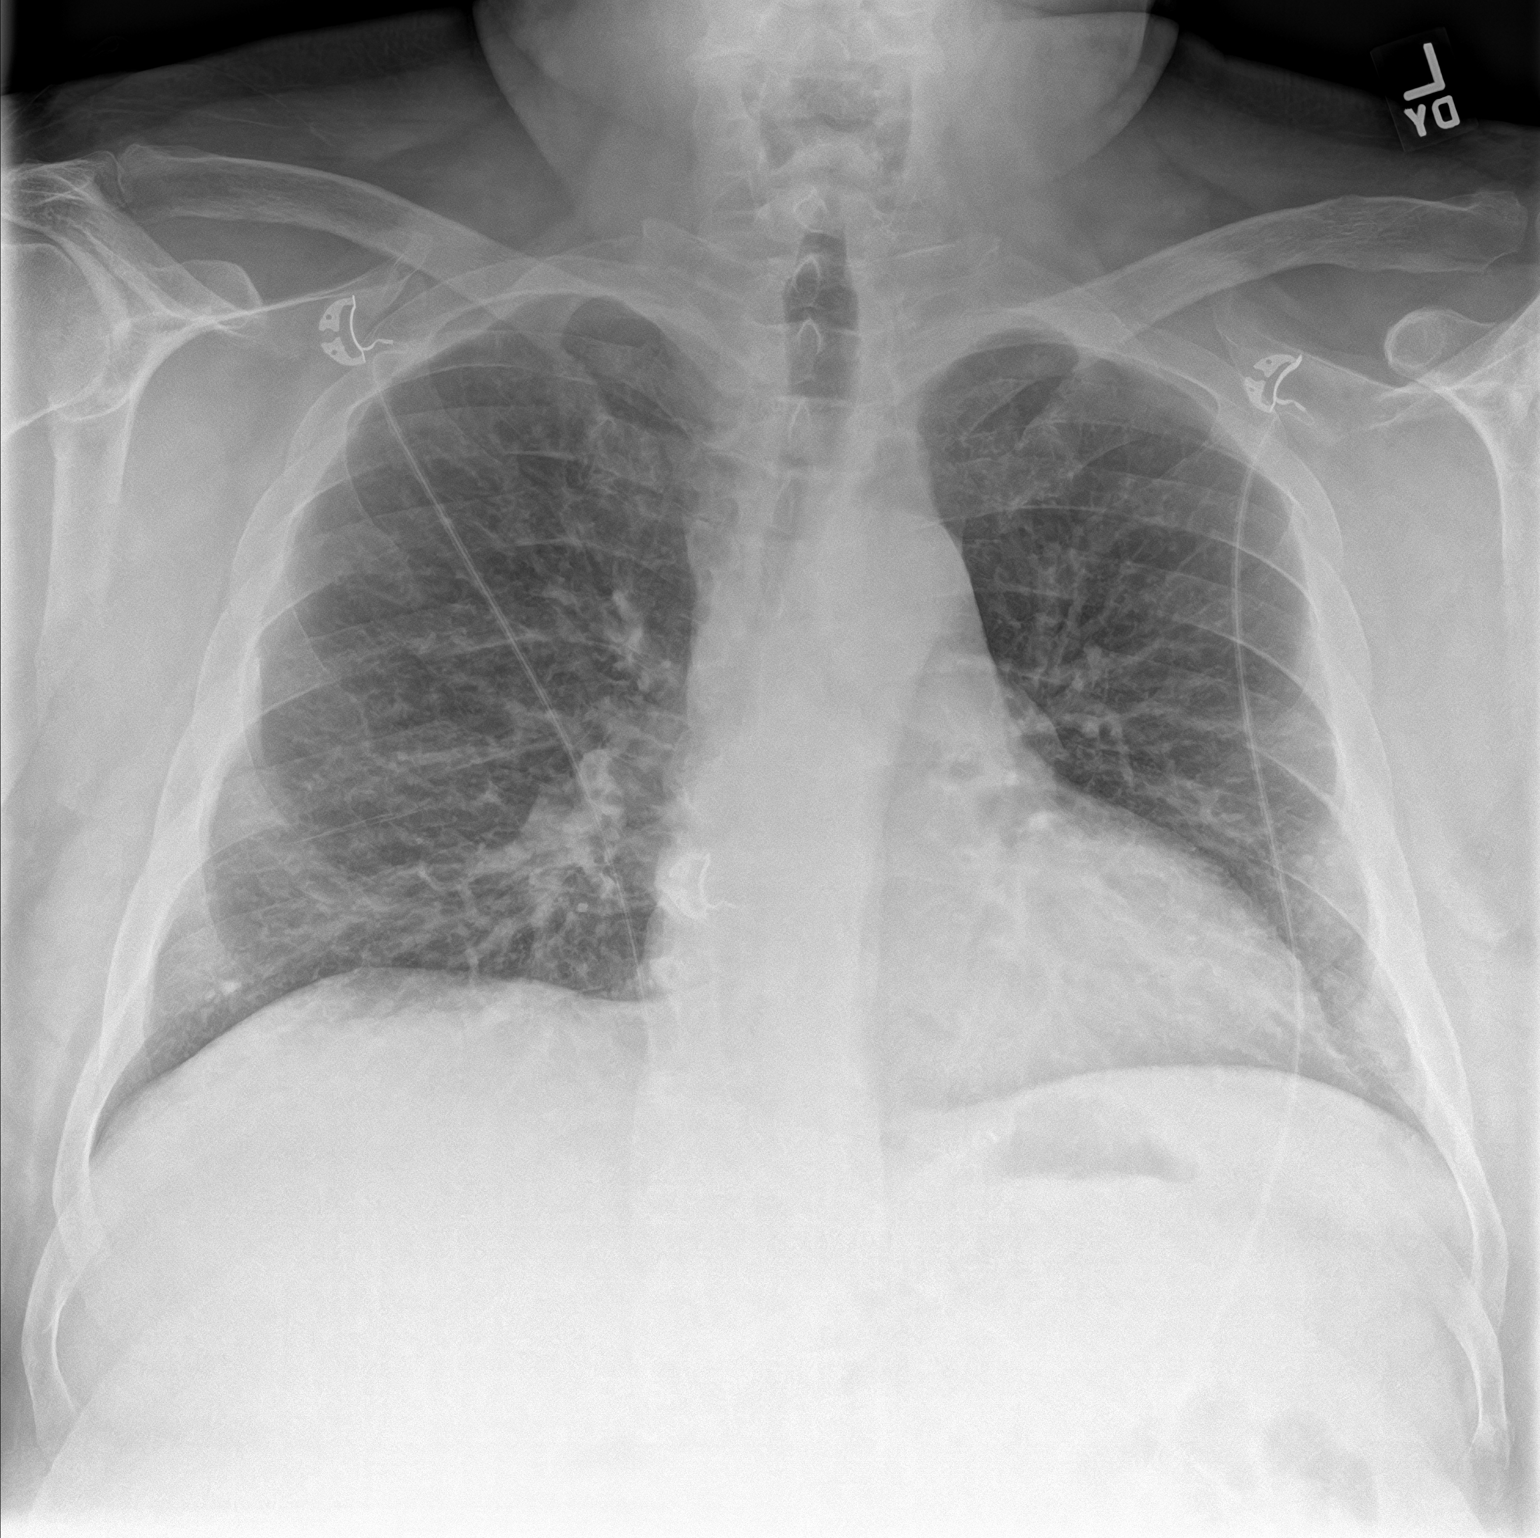

[chest lat]
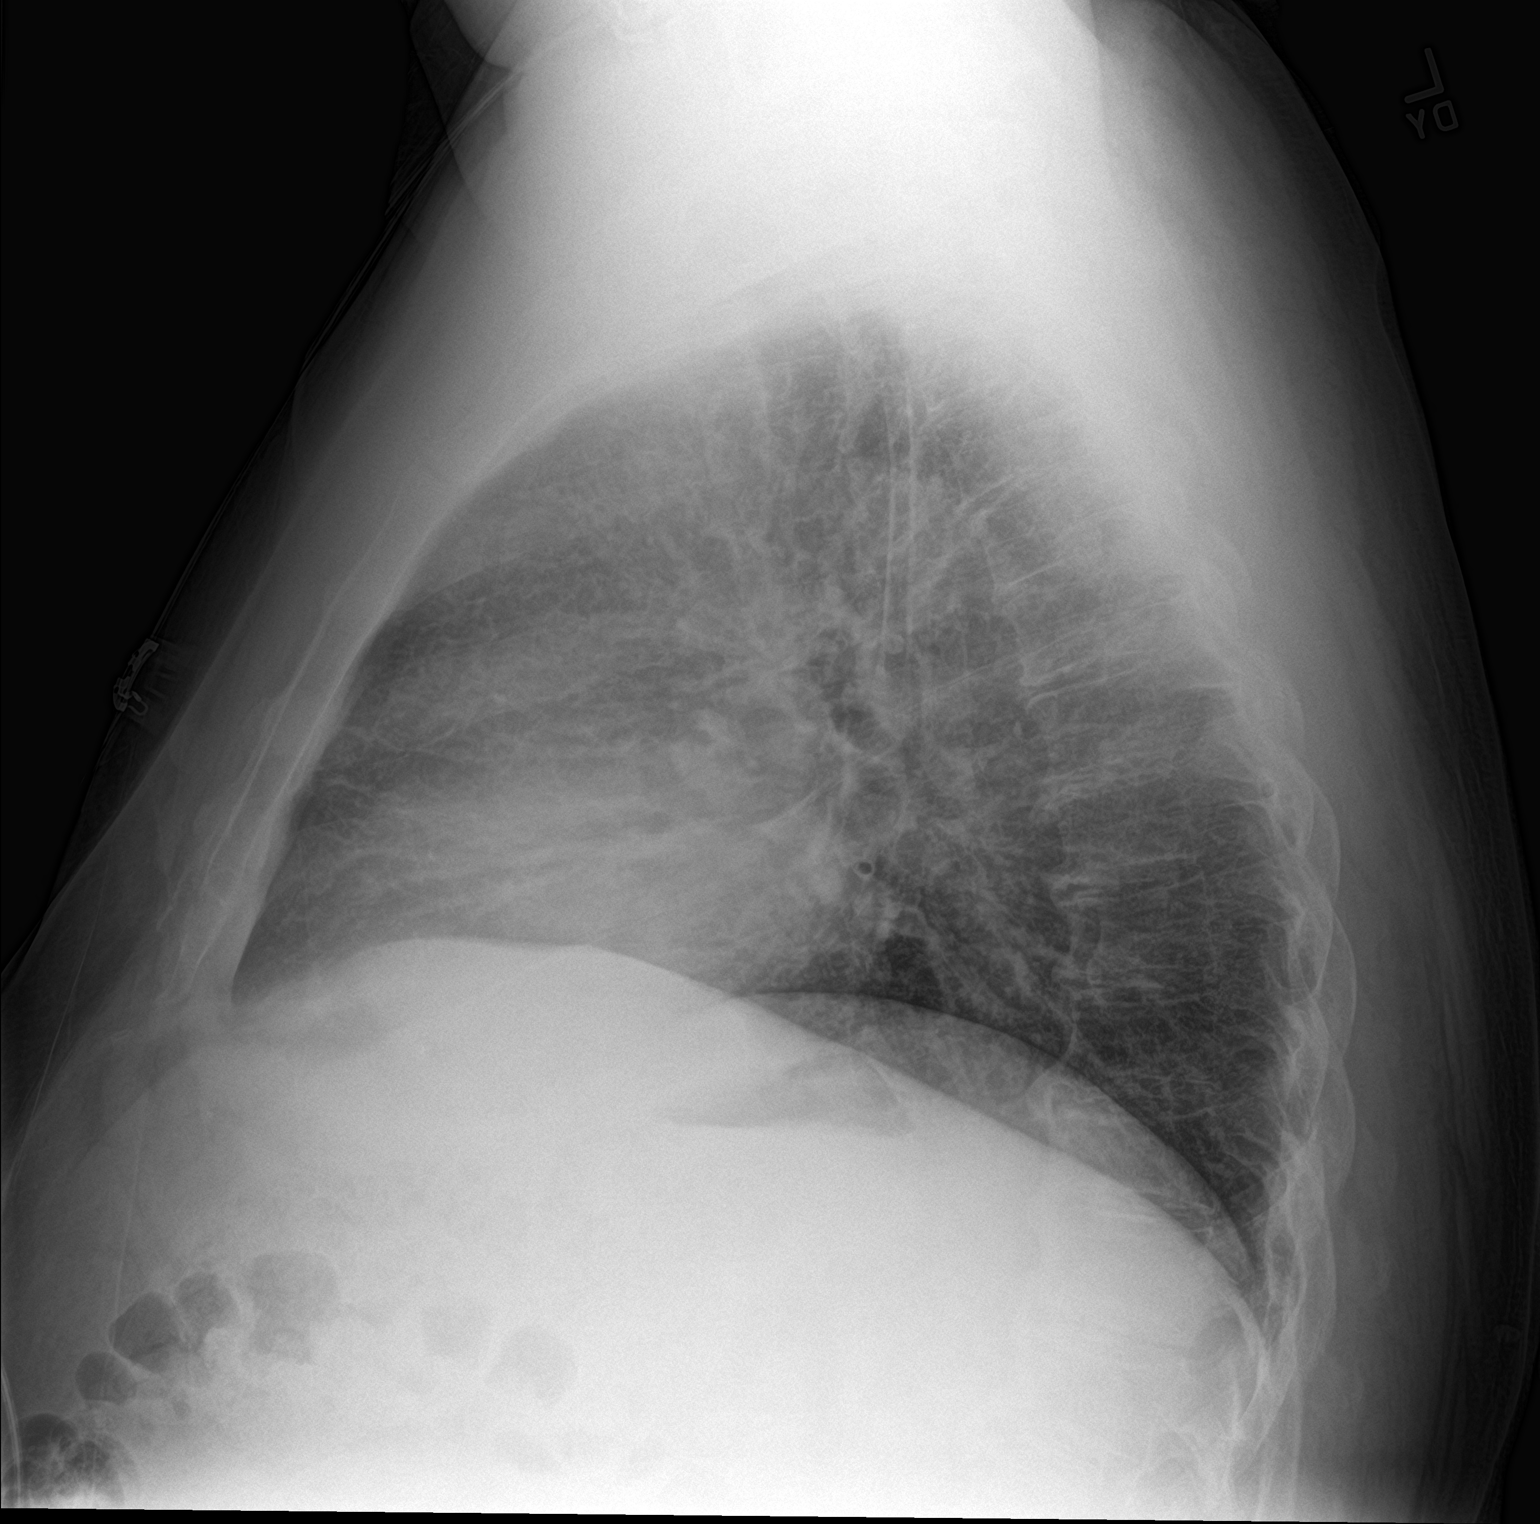

[2 of 2 positions shown; findings below may reference images not displayed]

FINDINGS: The cardiomediastinal silhouette is unchanged and within normal
limits. Chronic bronchitic changes are similar to the prior
radiographs. No acute airspace consolidation, edema, pleural
effusion, or pneumothorax is identified. No acute osseous
abnormality is seen.
IMPRESSION: Chronic bronchitic changes without evidence of acute cardiopulmonary
disease.

## 2019-05-10 MED ORDER — CLONIDINE HCL 0.1 MG PO TABS: 0.1000 mg | ORAL_TABLET | Freq: Two times a day (BID) | ORAL | 3 refills | Status: DC

## 2019-05-10 NOTE — Telephone Encounter (Signed)
Patient called to say that he can get his job back at [employer] if his blood pressure gets <140/90. Most recent BP at [facility] was 151/100. He says that he's taking all his medications as directed and hasn't missed anything.   He is currently taking metoprolol succinate 200mg  daily, entresto 97/103mg  BID, spironolactone 25mg  BID and torsemide 40mg  daily.  Will add clonidine 0.1mg  twice daily and RX has [facility]. Will see patient in clinic in the next 1-2 weeks.

## 2019-05-11 NOTE — Progress Notes (Signed)
Patient ID: Gabriel [name]., male    DOB: September 11, 1970, 48 y.o.   MRN: [id number]  HPI  Gabriel [name] is a 48 y/o male with a history of HTN, diabetes, COPD, CKD, asthma, previous tobacco use and chronic heart failure.   Most recent echo done 04/29/17 showed EF 45-50%. Echo report that was done 11/28/16 and it showed an EF of 40-45% along with mild Gabriel. EF has improved from 30-35% July 2017.  Was in the ED 11/19/2018 due to bronchitis where he was treated and released.  He presents today for a follow-up visit with a chief complaint of minimal fatigue upon moderate exertion. He describes this as chronic in nature having been present for several years. He has associated palpitations, difficulty sleeping and chest "burning" related to indigestion. He denies any dizziness, abdominal distention, pedal edema, shortness of breath, cough or weight gain.  Was recently started on clonidine for his BP and says that his BP is coming down nicely. He has noticed that when he wears the medical disposable masks, that his face itches and behind his ears hurt. He doesn't have those issues when he wears his personal cloth mask.   Past Medical History:  Diagnosis Date  . Chest wall pain, chronic   . Chronic systolic CHF (congestive heart failure) ([place])    a. 03/2015 Echo: EF 45-50%; b. 12/2015 Echo: EF 20-25%; c. 02/2016 Echo: EF 30-35%; d. 11/2016 Echo: EF 40-45%.  . Chronic Troponin Elevation   . CKD (chronic kidney disease), stage III ([place])   . COPD (chronic obstructive pulmonary disease) ([place])   . Diabetes mellitus without complication ([place])   . Hypertension    Resolved since weight loss  . Myocardial infarction ([place])   . NICM (nonischemic cardiomyopathy) ([place])    a. 03/2015 Echo: EF 45-50%; b. 04/2015 MV: small defect of mild severity in apex 2/2 apical thinning, EF 30-44%;  c. 12/2015 Echo: EF 20-25%; d. 02/2016 Echo: EF 30-35%; e. 11/2016 Echo: EF 40-45%, diff HK, mildly dil Ao root, mild Gabriel, mod dil LA;  f. 12/2016  Cath: LM nl, LAD/Diags/LCX/OMs min irregs, RCA 40p/m/d.  . Non-obstructive CAD (coronary artery disease)    a. 04/2015 low risk MV;  b. 12/2016 Cath: minor irregs in LAD/Diag/LCX/OM, RCA 40p/m/d.  [name] (nonsustained ventricular tachycardia) ([place])    a. 12/2015 noted on tele-->amio;  b. 12/2015 Event monitor: no VT. Atach noted.  . Obesity (BMI 30.0-34.9)   . Psoriasis   . Renal insufficiency   . Syncope    a. 01/2016 - felt to be vasovagal.  . Tobacco abuse    Past Surgical History:  Procedure Laterality Date  . AMPUTATION    . CARDIAC CATHETERIZATION    . FINGER AMPUTATION     Traumatic  . FINGER FRACTURE SURGERY Left   . LEFT HEART CATH AND CORONARY ANGIOGRAPHY N/A 01/06/2017   Procedure: Left Heart Cath and Coronary Angiography;  Surgeon: Wellington Hampshire, MD;  Location: [facility];  Service: Cardiovascular;  Laterality: N/A;   Family History  Problem Relation Age of Onset  . Diabetes Mellitus II Mother   . Hypertension Father   . Cancer Paternal Aunt   . Cancer Maternal Grandfather   . Diabetes Maternal Grandfather    Social History   Tobacco Use  . Smoking status: Former Smoker    Packs/day: 1.00    Years: 33.00    Pack years: 33.00    Types: Cigarettes  . Smokeless  tobacco: Never Used  . Tobacco comment: Quit a little over a month ago.  Substance Use Topics  . Alcohol use: No    Alcohol/week: 0.0 standard drinks    Frequency: Never    Comment: occassionally   Allergies  Allergen Reactions  . Prednisone Other (See Comments)    Reaction: Hallucinations    . Zantac [Ranitidine Hcl] Diarrhea and Nausea Only    Night sweats   Prior to Admission medications   Medication Sig Start Date End Date Taking? Authorizing Provider  aspirin 81 MG chewable tablet Chew 1 tablet (81 mg total) by mouth daily. Reported on 02/08/2016 08/27/18  Yes [name] A, FNP  cloNIDine (CATAPRES) 0.1 MG tablet Take 1 tablet (0.1 mg total) by mouth 2 (two) times daily. 05/10/19   Yes Cherae Marton, Otila Kluver A, FNP  insulin detemir (LEVEMIR) 100 UNIT/ML injection Inject 0.24 mLs (24 Units total) into the skin at bedtime. 09/30/18  Yes Tawni Millers, MD  metFORMIN (GLUCOPHAGE) 500 MG tablet Take 1 tablet (500mg ) with breakfast and 2 tablets (500mg ) at dinner. 04/07/19  Yes Tawni Millers, MD  metoprolol (TOPROL XL) 200 MG 24 hr tablet Take 1 tablet (200 mg total) by mouth daily. Patient taking differently: Take 100 mg by mouth 2 (two) times daily.  08/27/18  Yes Mayme Profeta, Otila Kluver A, FNP  pantoprazole (PROTONIX) 20 MG tablet Take 1 tablet (20 mg total) by mouth at bedtime. 09/09/18  Yes Tawni Millers, MD  rosuvastatin (CRESTOR) 20 MG tablet Take 1 tablet (20 mg total) by mouth daily. 08/27/18  Yes Kentavious Michele A, FNP  sacubitril-valsartan (ENTRESTO) 97-103 MG Take 1 tablet by mouth 2 (two) times daily. 08/27/18  Yes [name] A, FNP  spironolactone (ALDACTONE) 25 MG tablet TAKE ONE TABLET BY MOUTH 2 TIMES A DAY 02/24/19  Yes Tawni Millers, MD  torsemide (DEMADEX) 20 MG tablet Take 2 tablets (40 mg total) by mouth daily. 03/03/19  Yes Alisa Graff, FNP    Review of Systems  Constitutional: Positive for fatigue. Negative for appetite change.  HENT: Positive for congestion. Negative for postnasal drip and sore throat.   Eyes: Negative.   Respiratory: Negative for cough, chest tightness and shortness of breath.   Cardiovascular: Positive for chest pain ("just the usual") and palpitations. Negative for leg swelling.  Gastrointestinal: Negative for abdominal distention and abdominal pain.  Endocrine: Negative.   Genitourinary: Negative.   Musculoskeletal: Positive for back pain.  Skin: Negative.   Allergic/Immunologic: Negative.   Neurological: Positive for numbness (down both legs at times). Negative for dizziness, light-headedness and headaches.  Hematological: Negative for adenopathy. Does not bruise/bleed easily.  Psychiatric/Behavioral: Positive for sleep disturbance. Negative for  dysphoric mood and suicidal ideas. The patient is not nervous/anxious.    Vitals:   05/12/19 0848  BP: 129/79  Pulse: 69  Resp: 18  SpO2: 98%  Weight: 244 lb (110.7 kg)  Height: 6' (1.829 m)   Wt Readings from Last 3 Encounters:  05/12/19 244 lb (110.7 kg)  04/07/19 245 lb 12.8 oz (111.5 kg)  11/19/18 245 lb (111.1 kg)   Lab Results  Component Value Date   CREATININE 1.22 03/31/2019   CREATININE 1.22 11/19/2018   CREATININE 1.24 09/09/2018    Physical Exam  Constitutional: He is oriented to person, place, and time. He appears well-developed and well-nourished.  HENT:  Head: Normocephalic and atraumatic.  Neck: Normal range of motion. Neck supple. No JVD present.  Cardiovascular: Normal rate and regular rhythm.  Pulmonary/Chest: Effort normal. He has no wheezes. He has no rales. He exhibits no tenderness.  Abdominal: Soft. He exhibits no distension. There is no abdominal tenderness.  Musculoskeletal:        General: No tenderness or edema.  Neurological: He is alert and oriented to person, place, and time.  Skin: Skin is warm and dry.  Psychiatric: He has a normal mood and affect. His behavior is normal. Thought content normal.  Nursing note and vitals reviewed.   Assessment & Plan:  1: Chronic heart failure with reduced ejection fraction- - NYHA class II - euvolemic today - weighing daily and reminded to call for an overnight weight gain of >2 pounds or a weekly weight gain of >5 pounds.  - weight down 1 pound from last visit 8 months ago - not adding salt and he was encouraged to closely follow a 2000mg  sodium diet - saw cardiologist Rockey Situ) 01/05/18  - BNP from 04/28/17 was 25.0  2: HTN- - BP looks much better today since taking clonidine; note given for his potential employer about what his BP was today - saw PCP @ [facility] 04/07/2019 - BMP from 03/31/2019 reviewed and showed sodium 138, potassium 4.2, creatinine 1.22 and GFR 69  3: Diabetes-   -  A1c  8.1% on 07/29/18 - says glucose at home yesterday was 239; has been eating some trail mix at night that has dehydrated bananas and mangos in it - followed by [facility] - completed some classes at the [facility]  Patient did not bring his medications nor a list. Each medication was verbally reviewed with the patient and he was encouraged to bring the bottles to every visit to confirm accuracy of list.  Note written for his potential employer to allow him to wear his personal face mask since he has such irritation with the disposable masks.   Return in 3 months or sooner for any questions/problems before then.

## 2019-05-12 ENCOUNTER — Ambulatory Visit: Payer: Medicaid Other | Attending: Family | Admitting: Family

## 2019-05-12 ENCOUNTER — Other Ambulatory Visit: Payer: Self-pay

## 2019-05-12 ENCOUNTER — Encounter: Payer: Self-pay | Admitting: Family

## 2019-05-12 ENCOUNTER — Telehealth: Payer: Self-pay | Admitting: Pharmacist

## 2019-05-12 VITALS — BP 129/79 | HR 69 | Resp 18 | Ht 72.0 in | Wt 244.0 lb

## 2019-05-12 DIAGNOSIS — G479 Sleep disorder, unspecified: Secondary | ICD-10-CM | POA: Insufficient documentation

## 2019-05-12 DIAGNOSIS — R1013 Epigastric pain: Secondary | ICD-10-CM | POA: Insufficient documentation

## 2019-05-12 DIAGNOSIS — Z79899 Other long term (current) drug therapy: Secondary | ICD-10-CM | POA: Insufficient documentation

## 2019-05-12 DIAGNOSIS — I10 Essential (primary) hypertension: Secondary | ICD-10-CM

## 2019-05-12 DIAGNOSIS — Z87891 Personal history of nicotine dependence: Secondary | ICD-10-CM | POA: Insufficient documentation

## 2019-05-12 DIAGNOSIS — E669 Obesity, unspecified: Secondary | ICD-10-CM | POA: Insufficient documentation

## 2019-05-12 DIAGNOSIS — Z8249 Family history of ischemic heart disease and other diseases of the circulatory system: Secondary | ICD-10-CM | POA: Insufficient documentation

## 2019-05-12 DIAGNOSIS — R5383 Other fatigue: Secondary | ICD-10-CM | POA: Insufficient documentation

## 2019-05-12 DIAGNOSIS — I2510 Atherosclerotic heart disease of native coronary artery without angina pectoris: Secondary | ICD-10-CM | POA: Insufficient documentation

## 2019-05-12 DIAGNOSIS — N183 Chronic kidney disease, stage 3 (moderate): Secondary | ICD-10-CM | POA: Insufficient documentation

## 2019-05-12 DIAGNOSIS — I252 Old myocardial infarction: Secondary | ICD-10-CM | POA: Insufficient documentation

## 2019-05-12 DIAGNOSIS — R002 Palpitations: Secondary | ICD-10-CM | POA: Insufficient documentation

## 2019-05-12 DIAGNOSIS — I5022 Chronic systolic (congestive) heart failure: Secondary | ICD-10-CM | POA: Insufficient documentation

## 2019-05-12 DIAGNOSIS — I130 Hypertensive heart and chronic kidney disease with heart failure and stage 1 through stage 4 chronic kidney disease, or unspecified chronic kidney disease: Secondary | ICD-10-CM | POA: Insufficient documentation

## 2019-05-12 DIAGNOSIS — J449 Chronic obstructive pulmonary disease, unspecified: Secondary | ICD-10-CM | POA: Insufficient documentation

## 2019-05-12 DIAGNOSIS — Z7982 Long term (current) use of aspirin: Secondary | ICD-10-CM | POA: Insufficient documentation

## 2019-05-12 DIAGNOSIS — E1122 Type 2 diabetes mellitus with diabetic chronic kidney disease: Secondary | ICD-10-CM | POA: Insufficient documentation

## 2019-05-12 DIAGNOSIS — E119 Type 2 diabetes mellitus without complications: Secondary | ICD-10-CM

## 2019-05-12 DIAGNOSIS — I472 Ventricular tachycardia: Secondary | ICD-10-CM | POA: Insufficient documentation

## 2019-05-12 DIAGNOSIS — Z6830 Body mass index (BMI) 30.0-30.9, adult: Secondary | ICD-10-CM | POA: Insufficient documentation

## 2019-05-12 DIAGNOSIS — Z794 Long term (current) use of insulin: Secondary | ICD-10-CM | POA: Insufficient documentation

## 2019-05-12 NOTE — Telephone Encounter (Signed)
05/12/2019 12:20:23 PM - [name] & tips renewal  05/12/2019 Mailing patient his portion of [name] application for renewal of [place], also printed scripts for these meds for Dr. Mable Fill to sign, will give to [facility] tomorrow.Gabriel [name]

## 2019-05-12 NOTE — Patient Instructions (Signed)
Continue weighing daily and call for an overnight weight gain of > 2 pounds or a weekly weight gain of >5 pounds. 

## 2019-05-21 ENCOUNTER — Telehealth: Payer: Self-pay | Admitting: Pharmacist

## 2019-05-21 NOTE — Telephone Encounter (Signed)
05/21/2019 10:28:57 AM - [name] pending  05/21/2019 I have received the signed scripts back from provider for [name] along with signed application--holding for patient to return his portion, mailed to patient 05/11/2019.Gabriel [name]

## 2019-05-24 ENCOUNTER — Emergency Department
Admission: EM | Admit: 2019-05-24 | Discharge: 2019-05-24 | Disposition: A | Discharge status: Home / Self Care | Payer: Medicaid Other | Attending: Emergency Medicine | Admitting: Emergency Medicine

## 2019-05-24 ENCOUNTER — Other Ambulatory Visit: Payer: Self-pay

## 2019-05-24 ENCOUNTER — Emergency Department: Payer: Medicaid Other

## 2019-05-24 DIAGNOSIS — I130 Hypertensive heart and chronic kidney disease with heart failure and stage 1 through stage 4 chronic kidney disease, or unspecified chronic kidney disease: Secondary | ICD-10-CM | POA: Insufficient documentation

## 2019-05-24 DIAGNOSIS — Z794 Long term (current) use of insulin: Secondary | ICD-10-CM | POA: Insufficient documentation

## 2019-05-24 DIAGNOSIS — J449 Chronic obstructive pulmonary disease, unspecified: Secondary | ICD-10-CM | POA: Insufficient documentation

## 2019-05-24 DIAGNOSIS — F17210 Nicotine dependence, cigarettes, uncomplicated: Secondary | ICD-10-CM | POA: Insufficient documentation

## 2019-05-24 DIAGNOSIS — E1122 Type 2 diabetes mellitus with diabetic chronic kidney disease: Secondary | ICD-10-CM | POA: Insufficient documentation

## 2019-05-24 DIAGNOSIS — Z79899 Other long term (current) drug therapy: Secondary | ICD-10-CM | POA: Insufficient documentation

## 2019-05-24 DIAGNOSIS — N183 Chronic kidney disease, stage 3 unspecified: Secondary | ICD-10-CM | POA: Insufficient documentation

## 2019-05-24 DIAGNOSIS — Z7982 Long term (current) use of aspirin: Secondary | ICD-10-CM | POA: Insufficient documentation

## 2019-05-24 DIAGNOSIS — I509 Heart failure, unspecified: Secondary | ICD-10-CM | POA: Insufficient documentation

## 2019-05-24 DIAGNOSIS — M5442 Lumbago with sciatica, left side: Secondary | ICD-10-CM | POA: Insufficient documentation

## 2019-05-24 MED ORDER — NAPROXEN 500 MG PO TABS: 500.0000 mg | ORAL_TABLET | Freq: Two times a day (BID) | ORAL | 0 refills | Status: DC

## 2019-05-24 MED ORDER — HYDROCODONE-ACETAMINOPHEN 5-325 MG PO TABS: 1.0000 | ORAL_TABLET | Freq: Four times a day (QID) | ORAL | 0 refills | Status: DC | PRN

## 2019-05-24 MED ORDER — HYDROCODONE-ACETAMINOPHEN 5-325 MG PO TABS
1.0000 | ORAL_TABLET | Freq: Once | ORAL | Status: AC
  Administered 2019-05-24: 1 via ORAL
  Filled 2019-05-24: qty 1

## 2019-05-24 NOTE — Discharge Instructions (Addendum)
Follow-up with [facility] if any continued problems or concerns.  Begin taking naproxen 500 mg twice daily with food.  You may also take hydrocodone as needed for pain.  Do not drive or operate machinery while taking this medication.  Also apply ice or heat to your lower back as needed for discomfort.  You may need to be referred to a pain clinic if this continues and this referral can come from the [facility].

## 2019-05-24 NOTE — ED Triage Notes (Signed)
Pt c/o left lower back pain that radiates into the left leg since Friday while at work. States he does a lot of heavy lifting.

## 2019-05-24 NOTE — ED Provider Notes (Signed)
[facility] Emergency Department Provider Note  ____________________________________________   First MD Initiated Contact with Patient 05/24/19 [phone]     (approximate)  I have reviewed the triage vital signs and the nursing notes.   HISTORY  Chief Complaint Back Pain   HPI Gabriel [name]. is a 48 y.o. male presents to the ED with complaint of low back pain with radiation to his left lower extremity.  Patient states that he has had difficulties off and on for years but the most recent episode has been in the last 2 to 3 days.  States that there is no problems when he is lying however when he stands or walks there is increased pain especially in his left lower extremity.Marland Kitchen  He denies any urinary symptoms, incontinence of bowel or bladder or saddle anesthesias.  Patient has not been taking any over-the-counter medications.  Denies any recent injury.  Currently rates his pain as a 10/10.       Past Medical History:  Diagnosis Date  . Chest wall pain, chronic   . Chronic systolic CHF (congestive heart failure) ([place])    a. 03/2015 Echo: EF 45-50%; b. 12/2015 Echo: EF 20-25%; c. 02/2016 Echo: EF 30-35%; d. 11/2016 Echo: EF 40-45%.  . Chronic Troponin Elevation   . CKD (chronic kidney disease), stage III   . COPD (chronic obstructive pulmonary disease) ([place])   . Diabetes mellitus without complication ([place])   . Hypertension    Resolved since weight loss  . Myocardial infarction ([place])   . NICM (nonischemic cardiomyopathy) ([place])    a. 03/2015 Echo: EF 45-50%; b. 04/2015 MV: small defect of mild severity in apex 2/2 apical thinning, EF 30-44%;  c. 12/2015 Echo: EF 20-25%; d. 02/2016 Echo: EF 30-35%; e. 11/2016 Echo: EF 40-45%, diff HK, mildly dil Ao root, mild MR, mod dil LA;  f. 12/2016 Cath: LM nl, LAD/Diags/LCX/OMs min irregs, RCA 40p/m/d.  . Non-obstructive CAD (coronary artery disease)    a. 04/2015 low risk MV;  b. 12/2016 Cath: minor irregs in LAD/Diag/LCX/OM, RCA  40p/m/d.  [name] (nonsustained ventricular tachycardia) ([place])    a. 12/2015 noted on tele-->amio;  b. 12/2015 Event monitor: no VT. Atach noted.  . Obesity (BMI 30.0-34.9)   . Psoriasis   . Renal insufficiency   . Syncope    a. 01/2016 - felt to be vasovagal.  . Tobacco abuse     Patient Active Problem List   Diagnosis Date Noted  . Chronic systolic heart failure ([place]) 09/02/2017  . Chest pain 08/24/2017  . Dizziness   . Noncompliance with medications 04/29/2017  . Back pain 03/06/2017  . Tobacco use 12/04/2016  . Gallbladder sludge 12/04/2016  . Coronary artery disease, non-occlusive 03/15/2016  . Atypical chest pain 02/16/2016  . Orthostatic hypotension 01/23/2016  . Hypomagnesemia 01/23/2016  . Congestive dilated cardiomyopathy ([place]) 01/17/2016  . NSVT (nonsustained ventricular tachycardia) ([place])   . Elevated troponin   . Diabetes ([place]) 05/17/2015  . Hyperlipidemia 05/17/2015  . COPD (chronic obstructive pulmonary disease) ([place]) 05/17/2015  . HTN (hypertension) 03/31/2015    Past Surgical History:  Procedure Laterality Date  . AMPUTATION    . CARDIAC CATHETERIZATION    . FINGER AMPUTATION     Traumatic  . FINGER FRACTURE SURGERY Left   . LEFT HEART CATH AND CORONARY ANGIOGRAPHY N/A 01/06/2017   Procedure: Left Heart Cath and Coronary Angiography;  Surgeon: Wellington Hampshire, MD;  Location: [facility];  Service: Cardiovascular;  Laterality: N/A;    Prior to Admission medications   Medication Sig Start Date End Date Taking? Authorizing Provider  aspirin 81 MG chewable tablet Chew 1 tablet (81 mg total) by mouth daily. Reported on 02/08/2016 08/27/18   Alisa Graff, FNP  cloNIDine (CATAPRES) 0.1 MG tablet Take 1 tablet (0.1 mg total) by mouth 2 (two) times daily. 05/10/19   Alisa Graff, FNP  HYDROcodone-acetaminophen (NORCO/VICODIN) 5-325 MG tablet Take 1 tablet by mouth every 6 (six) hours as needed for moderate pain. 05/24/19   Johnn Hai, PA-C   insulin detemir (LEVEMIR) 100 UNIT/ML injection Inject 0.24 mLs (24 Units total) into the skin at bedtime. 09/30/18   Tawni Millers, MD  metFORMIN (GLUCOPHAGE) 500 MG tablet Take 1 tablet (500mg ) with breakfast and 2 tablets (500mg ) at dinner. 04/07/19   Tawni Millers, MD  metoprolol (TOPROL XL) 200 MG 24 hr tablet Take 1 tablet (200 mg total) by mouth daily. Patient taking differently: Take 100 mg by mouth 2 (two) times daily.  08/27/18   Alisa Graff, FNP  naproxen (NAPROSYN) 500 MG tablet Take 1 tablet (500 mg total) by mouth 2 (two) times daily with a meal. 05/24/19   Letitia Neri L, PA-C  pantoprazole (PROTONIX) 20 MG tablet Take 1 tablet (20 mg total) by mouth at bedtime. 09/09/18   Tawni Millers, MD  rosuvastatin (CRESTOR) 20 MG tablet Take 1 tablet (20 mg total) by mouth daily. 08/27/18   Alisa Graff, FNP  sacubitril-valsartan (ENTRESTO) 97-103 MG Take 1 tablet by mouth 2 (two) times daily. 08/27/18   Alisa Graff, FNP  spironolactone (ALDACTONE) 25 MG tablet TAKE ONE TABLET BY MOUTH 2 TIMES A DAY 02/24/19   Tawni Millers, MD  torsemide (DEMADEX) 20 MG tablet Take 2 tablets (40 mg total) by mouth daily. 03/03/19   Alisa Graff, FNP    Allergies Prednisone and Zantac [ranitidine hcl]  Family History  Problem Relation Age of Onset  . Diabetes Mellitus II Mother   . Hypertension Father   . Cancer Paternal Aunt   . Cancer Maternal Grandfather   . Diabetes Maternal Grandfather     Social History Social History   Tobacco Use  . Smoking status: Current Some Day Smoker    Packs/day: 1.00    Years: 33.00    Pack years: 33.00    Types: Cigarettes  . Smokeless tobacco: Never Used  . Tobacco comment: Quit a little over a month ago.  Substance Use Topics  . Alcohol use: No    Alcohol/week: 0.0 standard drinks    Frequency: Never    Comment: occassionally  . Drug use: No    Review of Systems Constitutional: No fever/chills Cardiovascular: Denies chest pain.  Respiratory: Denies shortness of breath. Gastrointestinal: No abdominal pain.  No nausea, no vomiting.  Genitourinary: Negative for dysuria. Musculoskeletal: Positive low back pain with left leg radiculopathy. Skin: Negative for rash. Neurological: Negative for headaches, focal weakness or numbness. ____________________________________________   PHYSICAL EXAM:  VITAL SIGNS: ED Triage Vitals  Enc Vitals Group     BP 05/24/19 0814 (!) 146/91     Pulse Rate 05/24/19 0814 74     Resp 05/24/19 0814 18     Temp 05/24/19 0814 97.7 F (36.5 C)     Temp Source 05/24/19 0814 Oral     SpO2 05/24/19 0814 98 %     Weight 05/24/19 0815 244 lb (110.7 kg)     Height  05/24/19 0815 6' (1.829 m)     Head Circumference --      Peak Flow --      Pain Score 05/24/19 0815 10     Pain Loc --      Pain Edu? --      Excl. in [place]? --    Constitutional: Alert and oriented. Well appearing and in no acute distress. Eyes: Conjunctivae are normal.  Head: Atraumatic. Neck: No stridor.  Cardiovascular: Normal rate, regular rhythm. Grossly normal heart sounds.  Good peripheral circulation. Respiratory: Normal respiratory effort.  No retractions. Lungs CTAB. Gastrointestinal: Soft and nontender. No distention. Musculoskeletal: Moves upper and lower extremities however patient walks with a limp.  There is tenderness on palpation of the lumbar spine and paravertebral muscles.  There is marked tenderness on palpation of the left SI joint area and surrounding tissue with the left greater than the right.  Muscle strength bilaterally.  Straight leg raises were approximately 30 degrees. Neurologic:  Normal speech and language. No gross focal neurologic deficits are appreciated.  Reflexes 1+ bilaterally.  Skin:  Skin is warm, dry and intact. No rash noted. Psychiatric: Mood and affect are normal. Speech and behavior are normal.  ____________________________________________   LABS (all labs ordered are listed, but  only abnormal results are displayed)  Labs Reviewed - No data to display  RADIOLOGY  Official radiology report(s): Dg Lumbar Spine 2-3 Views  Result Date: 05/24/2019 CLINICAL DATA:  Low back pain radiating into the left upper leg. No known injury. EXAM: LUMBAR SPINE - 2-3 VIEW COMPARISON:  Plain films lumbar spine 04/10/2017. FINDINGS: Convex left scoliosis with the apex at L3-4 is again seen. 0.5 cm retrolisthesis L3 on L4 is unchanged. Multilevel degenerative disc disease appears worse than on the prior examination, particularly at the L2-3, L3-4 and L4-5 levels. Aortic atherosclerosis noted. IMPRESSION: No acute finding. 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 Atherosclerosis. Electronically Signed   By: Inge Rise M.D.   On: 05/24/2019 09:22    ____________________________________________   PROCEDURES  Procedure(s) performed (including Critical Care):  Procedures   ____________________________________________   INITIAL IMPRESSION / ASSESSMENT AND PLAN / ED COURSE  As part of my medical decision making, I reviewed the following data within the electronic MEDICAL RECORD NUMBER Notes from prior ED visits and [place] Controlled Substance Database  48 year old male presents to the ED with complaint of left lower back pain with radiculopathy into his left upper leg.  Patient that he has had this before but more recently began on Friday.  He denies any recent injury but states that he does a lot of heavy lifting at work.  Patient denies any urinary symptoms,  incontinence of bowel or bladder.  X-rays showed moderate degenerative changes patient was made aware.  He has in the past had hallucinations on prednisone so he was placed on naproxen 500 mg twice daily with food and Norco every 6 hours as needed for pain.  He was given a note to remain out of work for the next 2 days.  [facility].   ____________________________________________   FINAL CLINICAL IMPRESSION(S) / ED DIAGNOSES  Final diagnoses:  Left-sided low back pain with left-sided sciatica, unspecified chronicity     ED Discharge Orders         Ordered    HYDROcodone-acetaminophen (NORCO/VICODIN) 5-325 MG tablet  Every 6 hours PRN     05/24/19 0956    naproxen (NAPROSYN)  500 MG tablet  2 times daily with meals     05/24/19 0956           Note:  This document was prepared using Dragon voice recognition software and may include unintentional dictation errors.    Johnn Hai, PA-C 05/24/19 1039    Blake Divine, MD 05/24/19 [phone]

## 2019-05-24 NOTE — ED Notes (Signed)
See triage note  Presents with pain to left lower back which is moving into left upper leg.  States pain started on Friday while at work.  States he used a knee brace which eased off pain then pain became worse last pm  Ambulates with slight limp d/t pain

## 2019-06-25 ENCOUNTER — Inpatient Hospital Stay
Admission: EM | Admit: 2019-06-25 | Discharge: 2019-06-27 | DRG: 291 | Disposition: A | Discharge status: Home / Self Care | Payer: Medicaid Other | Attending: Internal Medicine | Admitting: Internal Medicine

## 2019-06-25 ENCOUNTER — Encounter: Payer: Self-pay | Admitting: Emergency Medicine

## 2019-06-25 ENCOUNTER — Other Ambulatory Visit: Payer: Self-pay

## 2019-06-25 ENCOUNTER — Emergency Department: Payer: Medicaid Other

## 2019-06-25 DIAGNOSIS — R079 Chest pain, unspecified: Secondary | ICD-10-CM

## 2019-06-25 DIAGNOSIS — J449 Chronic obstructive pulmonary disease, unspecified: Secondary | ICD-10-CM | POA: Diagnosis present

## 2019-06-25 DIAGNOSIS — Z720 Tobacco use: Secondary | ICD-10-CM | POA: Diagnosis present

## 2019-06-25 DIAGNOSIS — R778 Other specified abnormalities of plasma proteins: Secondary | ICD-10-CM | POA: Diagnosis present

## 2019-06-25 DIAGNOSIS — N1831 Chronic kidney disease, stage 3a: Secondary | ICD-10-CM | POA: Diagnosis present

## 2019-06-25 DIAGNOSIS — R0789 Other chest pain: Secondary | ICD-10-CM | POA: Diagnosis present

## 2019-06-25 DIAGNOSIS — Z7982 Long term (current) use of aspirin: Secondary | ICD-10-CM

## 2019-06-25 DIAGNOSIS — I5043 Acute on chronic combined systolic (congestive) and diastolic (congestive) heart failure: Secondary | ICD-10-CM | POA: Diagnosis present

## 2019-06-25 DIAGNOSIS — I509 Heart failure, unspecified: Secondary | ICD-10-CM

## 2019-06-25 DIAGNOSIS — R0602 Shortness of breath: Secondary | ICD-10-CM

## 2019-06-25 DIAGNOSIS — Z833 Family history of diabetes mellitus: Secondary | ICD-10-CM

## 2019-06-25 DIAGNOSIS — N183 Chronic kidney disease, stage 3 unspecified: Secondary | ICD-10-CM | POA: Diagnosis present

## 2019-06-25 DIAGNOSIS — G8929 Other chronic pain: Secondary | ICD-10-CM | POA: Diagnosis present

## 2019-06-25 DIAGNOSIS — E669 Obesity, unspecified: Secondary | ICD-10-CM | POA: Diagnosis present

## 2019-06-25 DIAGNOSIS — I160 Hypertensive urgency: Secondary | ICD-10-CM | POA: Diagnosis present

## 2019-06-25 DIAGNOSIS — E785 Hyperlipidemia, unspecified: Secondary | ICD-10-CM | POA: Diagnosis present

## 2019-06-25 DIAGNOSIS — I10 Essential (primary) hypertension: Secondary | ICD-10-CM | POA: Diagnosis present

## 2019-06-25 DIAGNOSIS — Z20828 Contact with and (suspected) exposure to other viral communicable diseases: Secondary | ICD-10-CM | POA: Diagnosis present

## 2019-06-25 DIAGNOSIS — Z23 Encounter for immunization: Secondary | ICD-10-CM

## 2019-06-25 DIAGNOSIS — I2510 Atherosclerotic heart disease of native coronary artery without angina pectoris: Secondary | ICD-10-CM | POA: Diagnosis present

## 2019-06-25 DIAGNOSIS — I130 Hypertensive heart and chronic kidney disease with heart failure and stage 1 through stage 4 chronic kidney disease, or unspecified chronic kidney disease: Principal | ICD-10-CM | POA: Diagnosis present

## 2019-06-25 DIAGNOSIS — E1122 Type 2 diabetes mellitus with diabetic chronic kidney disease: Secondary | ICD-10-CM | POA: Diagnosis present

## 2019-06-25 DIAGNOSIS — Z8249 Family history of ischemic heart disease and other diseases of the circulatory system: Secondary | ICD-10-CM

## 2019-06-25 DIAGNOSIS — Z794 Long term (current) use of insulin: Secondary | ICD-10-CM

## 2019-06-25 DIAGNOSIS — E1129 Type 2 diabetes mellitus with other diabetic kidney complication: Secondary | ICD-10-CM | POA: Diagnosis present

## 2019-06-25 DIAGNOSIS — F17210 Nicotine dependence, cigarettes, uncomplicated: Secondary | ICD-10-CM | POA: Diagnosis present

## 2019-06-25 DIAGNOSIS — R7989 Other specified abnormal findings of blood chemistry: Secondary | ICD-10-CM | POA: Diagnosis present

## 2019-06-25 DIAGNOSIS — Z6832 Body mass index (BMI) 32.0-32.9, adult: Secondary | ICD-10-CM

## 2019-06-25 DIAGNOSIS — I252 Old myocardial infarction: Secondary | ICD-10-CM

## 2019-06-25 DIAGNOSIS — I472 Ventricular tachycardia: Secondary | ICD-10-CM | POA: Diagnosis present

## 2019-06-25 DIAGNOSIS — E1169 Type 2 diabetes mellitus with other specified complication: Secondary | ICD-10-CM | POA: Diagnosis present

## 2019-06-25 LAB — CBC
HCT: 48.1 % (ref 39.0–52.0)
Hemoglobin: 15.4 g/dL (ref 13.0–17.0)
MCH: 27.8 pg (ref 26.0–34.0)
MCHC: 32 g/dL (ref 30.0–36.0)
MCV: 86.8 fL (ref 80.0–100.0)
Platelets: 212 10*3/uL (ref 150–400)
RBC: 5.54 MIL/uL (ref 4.22–5.81)
RDW: 13.8 % (ref 11.5–15.5)
WBC: 8.3 10*3/uL (ref 4.0–10.5)
nRBC: 0 % (ref 0.0–0.2)

## 2019-06-25 LAB — TROPONIN I (HIGH SENSITIVITY): Troponin I (High Sensitivity): 62 ng/L — ABNORMAL HIGH (ref <18)

## 2019-06-25 LAB — BASIC METABOLIC PANEL
Anion gap: 11 (ref 5–15)
BUN: 19 mg/dL (ref 6–20)
CO2: 22 mmol/L (ref 22–32)
Calcium: 8.9 mg/dL (ref 8.9–10.3)
Chloride: 105 mmol/L (ref 98–111)
Creatinine, Ser: 1.06 mg/dL (ref 0.61–1.24)
GFR calc Af Amer: >60 mL/min (ref >60)
GFR calc non Af Amer: >60 mL/min (ref >60)
Glucose, Bld: 237 mg/dL — ABNORMAL HIGH (ref 70–99)
Potassium: 3.6 mmol/L (ref 3.5–5.1)
Sodium: 138 mmol/L (ref 135–145)

## 2019-06-25 LAB — HEPATIC FUNCTION PANEL
ALT: 41 U/L (ref 0–44)
AST: 22 U/L (ref 15–41)
Albumin: 3.8 g/dL (ref 3.5–5.0)
Alkaline Phosphatase: 73 U/L (ref 38–126)
Bilirubin, Direct: 0.2 mg/dL (ref 0.0–0.2)
Indirect Bilirubin: 1.6 mg/dL — ABNORMAL HIGH (ref 0.3–0.9)
Total Bilirubin: 1.8 mg/dL — ABNORMAL HIGH (ref 0.3–1.2)
Total Protein: 6.8 g/dL (ref 6.5–8.1)

## 2019-06-25 LAB — BRAIN NATRIURETIC PEPTIDE: B Natriuretic Peptide: 948 pg/mL — ABNORMAL HIGH (ref 0.0–100.0)

## 2019-06-25 NOTE — ED Triage Notes (Signed)
Patient with complaint of central chest pain and shortness of breath times two days. Patient states that the symptoms became worse tonight. Patient with clear lung sounds.

## 2019-06-26 ENCOUNTER — Inpatient Hospital Stay (HOSPITAL_COMMUNITY)
Admit: 2019-06-26 | Discharge: 2019-06-26 | Disposition: A | Discharge status: Home / Self Care | Payer: Medicaid Other | Attending: Family Medicine | Admitting: Family Medicine

## 2019-06-26 ENCOUNTER — Encounter: Payer: Self-pay | Admitting: Cardiology

## 2019-06-26 DIAGNOSIS — I509 Heart failure, unspecified: Secondary | ICD-10-CM

## 2019-06-26 DIAGNOSIS — Z8249 Family history of ischemic heart disease and other diseases of the circulatory system: Secondary | ICD-10-CM | POA: Diagnosis not present

## 2019-06-26 DIAGNOSIS — N183 Chronic kidney disease, stage 3 unspecified: Secondary | ICD-10-CM | POA: Diagnosis present

## 2019-06-26 DIAGNOSIS — I2510 Atherosclerotic heart disease of native coronary artery without angina pectoris: Secondary | ICD-10-CM | POA: Diagnosis present

## 2019-06-26 DIAGNOSIS — I252 Old myocardial infarction: Secondary | ICD-10-CM | POA: Diagnosis not present

## 2019-06-26 DIAGNOSIS — Z23 Encounter for immunization: Secondary | ICD-10-CM | POA: Diagnosis not present

## 2019-06-26 DIAGNOSIS — I5043 Acute on chronic combined systolic (congestive) and diastolic (congestive) heart failure: Secondary | ICD-10-CM

## 2019-06-26 DIAGNOSIS — J449 Chronic obstructive pulmonary disease, unspecified: Secondary | ICD-10-CM | POA: Diagnosis present

## 2019-06-26 DIAGNOSIS — R778 Other specified abnormalities of plasma proteins: Secondary | ICD-10-CM

## 2019-06-26 DIAGNOSIS — Z6832 Body mass index (BMI) 32.0-32.9, adult: Secondary | ICD-10-CM | POA: Diagnosis not present

## 2019-06-26 DIAGNOSIS — Z20828 Contact with and (suspected) exposure to other viral communicable diseases: Secondary | ICD-10-CM | POA: Diagnosis present

## 2019-06-26 DIAGNOSIS — I130 Hypertensive heart and chronic kidney disease with heart failure and stage 1 through stage 4 chronic kidney disease, or unspecified chronic kidney disease: Secondary | ICD-10-CM | POA: Diagnosis not present

## 2019-06-26 DIAGNOSIS — E1169 Type 2 diabetes mellitus with other specified complication: Secondary | ICD-10-CM | POA: Diagnosis present

## 2019-06-26 DIAGNOSIS — Z833 Family history of diabetes mellitus: Secondary | ICD-10-CM | POA: Diagnosis not present

## 2019-06-26 DIAGNOSIS — I340 Nonrheumatic mitral (valve) insufficiency: Secondary | ICD-10-CM

## 2019-06-26 DIAGNOSIS — R079 Chest pain, unspecified: Secondary | ICD-10-CM

## 2019-06-26 DIAGNOSIS — I160 Hypertensive urgency: Secondary | ICD-10-CM | POA: Diagnosis present

## 2019-06-26 DIAGNOSIS — E1129 Type 2 diabetes mellitus with other diabetic kidney complication: Secondary | ICD-10-CM | POA: Diagnosis present

## 2019-06-26 DIAGNOSIS — I10 Essential (primary) hypertension: Secondary | ICD-10-CM

## 2019-06-26 DIAGNOSIS — F17210 Nicotine dependence, cigarettes, uncomplicated: Secondary | ICD-10-CM | POA: Diagnosis present

## 2019-06-26 DIAGNOSIS — R0602 Shortness of breath: Secondary | ICD-10-CM | POA: Diagnosis not present

## 2019-06-26 DIAGNOSIS — E1122 Type 2 diabetes mellitus with diabetic chronic kidney disease: Secondary | ICD-10-CM | POA: Diagnosis present

## 2019-06-26 DIAGNOSIS — N1831 Chronic kidney disease, stage 3a: Secondary | ICD-10-CM | POA: Diagnosis present

## 2019-06-26 DIAGNOSIS — I361 Nonrheumatic tricuspid (valve) insufficiency: Secondary | ICD-10-CM

## 2019-06-26 DIAGNOSIS — E669 Obesity, unspecified: Secondary | ICD-10-CM | POA: Diagnosis present

## 2019-06-26 DIAGNOSIS — Z7982 Long term (current) use of aspirin: Secondary | ICD-10-CM | POA: Diagnosis not present

## 2019-06-26 DIAGNOSIS — N189 Chronic kidney disease, unspecified: Secondary | ICD-10-CM

## 2019-06-26 DIAGNOSIS — Z794 Long term (current) use of insulin: Secondary | ICD-10-CM | POA: Diagnosis not present

## 2019-06-26 DIAGNOSIS — E785 Hyperlipidemia, unspecified: Secondary | ICD-10-CM | POA: Diagnosis present

## 2019-06-26 DIAGNOSIS — Z720 Tobacco use: Secondary | ICD-10-CM

## 2019-06-26 DIAGNOSIS — I472 Ventricular tachycardia: Secondary | ICD-10-CM | POA: Diagnosis present

## 2019-06-26 LAB — ECHOCARDIOGRAM COMPLETE
Height: 72 in
Weight: 3920 oz

## 2019-06-26 LAB — CBC WITH DIFFERENTIAL/PLATELET
Abs Immature Granulocytes: 0.06 10*3/uL (ref 0.00–0.07)
Basophils Absolute: 0.1 10*3/uL (ref 0.0–0.1)
Basophils Relative: 1 %
Eosinophils Absolute: 0.1 10*3/uL (ref 0.0–0.5)
Eosinophils Relative: 2 %
HCT: 45.9 % (ref 39.0–52.0)
Hemoglobin: 15 g/dL (ref 13.0–17.0)
Immature Granulocytes: 1 %
Lymphocytes Relative: 32 %
Lymphs Abs: 2.6 10*3/uL (ref 0.7–4.0)
MCH: 28.1 pg (ref 26.0–34.0)
MCHC: 32.7 g/dL (ref 30.0–36.0)
MCV: 86.1 fL (ref 80.0–100.0)
Monocytes Absolute: 0.5 10*3/uL (ref 0.1–1.0)
Monocytes Relative: 6 %
Neutro Abs: 4.9 10*3/uL (ref 1.7–7.7)
Neutrophils Relative %: 58 %
Platelets: 218 10*3/uL (ref 150–400)
RBC: 5.33 MIL/uL (ref 4.22–5.81)
RDW: 13.9 % (ref 11.5–15.5)
WBC: 8.4 10*3/uL (ref 4.0–10.5)
nRBC: 0 % (ref 0.0–0.2)

## 2019-06-26 LAB — SARS CORONAVIRUS 2 (TAT 6-24 HRS): SARS Coronavirus 2: NEGATIVE

## 2019-06-26 LAB — MAGNESIUM: Magnesium: 1.9 mg/dL (ref 1.7–2.4)

## 2019-06-26 LAB — GLUCOSE, CAPILLARY
Glucose-Capillary: 144 mg/dL — ABNORMAL HIGH (ref 70–99)
Glucose-Capillary: 193 mg/dL — ABNORMAL HIGH (ref 70–99)
Glucose-Capillary: 167 mg/dL — ABNORMAL HIGH (ref 70–99)

## 2019-06-26 LAB — TROPONIN I (HIGH SENSITIVITY)
Troponin I (High Sensitivity): 58 ng/L — ABNORMAL HIGH (ref <18)
Troponin I (High Sensitivity): 65 ng/L — ABNORMAL HIGH (ref <18)

## 2019-06-26 MED ORDER — INSULIN ASPART 100 UNIT/ML ~~LOC~~ SOLN
0.0000 [IU] | SUBCUTANEOUS | Status: DC
  Administered 2019-06-26 (×2): 2 [IU] via SUBCUTANEOUS
  Administered 2019-06-27: 3 [IU] via SUBCUTANEOUS
  Administered 2019-06-27 (×2): 1 [IU] via SUBCUTANEOUS
  Filled 2019-06-26: qty 0.09
  Filled 2019-06-26 (×5): qty 1

## 2019-06-26 MED ORDER — CLONIDINE HCL 0.1 MG PO TABS
0.1000 mg | ORAL_TABLET | Freq: Two times a day (BID) | ORAL | Status: DC
  Administered 2019-06-26 – 2019-06-27 (×4): 0.1 mg via ORAL
  Filled 2019-06-26 (×4): qty 1

## 2019-06-26 MED ORDER — ONDANSETRON HCL 4 MG/2ML IJ SOLN
4.0000 mg | Freq: Once | INTRAMUSCULAR | Status: AC
  Administered 2019-06-26: 4 mg via INTRAVENOUS
  Filled 2019-06-26: qty 2

## 2019-06-26 MED ORDER — BUMETANIDE 0.25 MG/ML IJ SOLN
1.0000 mg | Freq: Once | INTRAMUSCULAR | Status: AC
  Administered 2019-06-26: 1 mg via INTRAVENOUS
  Filled 2019-06-26: qty 4

## 2019-06-26 MED ORDER — INSULIN DETEMIR 100 UNIT/ML ~~LOC~~ SOLN
24.0000 [IU] | Freq: Every day | SUBCUTANEOUS | Status: DC
  Administered 2019-06-26: 02:00:00 24 [IU] via SUBCUTANEOUS
  Filled 2019-06-26 (×2): qty 0.24

## 2019-06-26 MED ORDER — SPIRONOLACTONE 25 MG PO TABS
25.0000 mg | ORAL_TABLET | Freq: Two times a day (BID) | ORAL | Status: DC
  Administered 2019-06-26 – 2019-06-27 (×3): 25 mg via ORAL
  Filled 2019-06-26 (×5): qty 1

## 2019-06-26 MED ORDER — SODIUM CHLORIDE 0.9% FLUSH: 3.0000 mL | INTRAVENOUS | Status: DC | PRN

## 2019-06-26 MED ORDER — METOPROLOL SUCCINATE ER 100 MG PO TB24
100.0000 mg | ORAL_TABLET | Freq: Two times a day (BID) | ORAL | Status: DC
  Administered 2019-06-26 – 2019-06-27 (×4): 100 mg via ORAL
  Filled 2019-06-26: qty 2
  Filled 2019-06-26: qty 4
  Filled 2019-06-26: qty 1
  Filled 2019-06-26: qty 4

## 2019-06-26 MED ORDER — INSULIN DETEMIR 100 UNIT/ML ~~LOC~~ SOLN
16.0000 [IU] | Freq: Every day | SUBCUTANEOUS | Status: DC
  Administered 2019-06-26: 22:00:00 16 [IU] via SUBCUTANEOUS
  Filled 2019-06-26 (×2): qty 0.16

## 2019-06-26 MED ORDER — ACETAMINOPHEN 325 MG PO TABS: 650.0000 mg | ORAL_TABLET | ORAL | Status: DC | PRN

## 2019-06-26 MED ORDER — MORPHINE SULFATE (PF) 4 MG/ML IV SOLN
4.0000 mg | Freq: Once | INTRAVENOUS | Status: AC
  Administered 2019-06-26: 4 mg via INTRAVENOUS
  Filled 2019-06-26: qty 1

## 2019-06-26 MED ORDER — ROSUVASTATIN CALCIUM 10 MG PO TABS
20.0000 mg | ORAL_TABLET | Freq: Every day | ORAL | Status: DC
  Administered 2019-06-26: 18:00:00 20 mg via ORAL
  Filled 2019-06-26 (×2): qty 1

## 2019-06-26 MED ORDER — ASPIRIN 81 MG PO CHEW
81.0000 mg | CHEWABLE_TABLET | Freq: Every day | ORAL | Status: DC
  Administered 2019-06-26 – 2019-06-27 (×2): 81 mg via ORAL
  Filled 2019-06-26 (×2): qty 1

## 2019-06-26 MED ORDER — SACUBITRIL-VALSARTAN 97-103 MG PO TABS
1.0000 | ORAL_TABLET | Freq: Two times a day (BID) | ORAL | Status: DC
  Administered 2019-06-26 – 2019-06-27 (×3): 1 via ORAL
  Filled 2019-06-26 (×5): qty 1

## 2019-06-26 MED ORDER — FUROSEMIDE 10 MG/ML IJ SOLN
40.0000 mg | Freq: Two times a day (BID) | INTRAMUSCULAR | Status: DC
  Administered 2019-06-26 – 2019-06-27 (×2): 40 mg via INTRAVENOUS
  Filled 2019-06-26 (×2): qty 4

## 2019-06-26 MED ORDER — MORPHINE SULFATE (PF) 2 MG/ML IV SOLN
2.0000 mg | INTRAVENOUS | Status: DC | PRN
  Administered 2019-06-26: 2 mg via INTRAVENOUS
  Filled 2019-06-26: qty 1

## 2019-06-26 MED ORDER — PERFLUTREN LIPID MICROSPHERE
2.0000 mL | INTRAVENOUS | Status: AC | PRN
  Administered 2019-06-26: 2 mL via INTRAVENOUS
  Filled 2019-06-26 (×2): qty 2

## 2019-06-26 MED ORDER — NITROGLYCERIN 0.4 MG SL SUBL: 0.4000 mg | SUBLINGUAL_TABLET | SUBLINGUAL | Status: DC | PRN

## 2019-06-26 MED ORDER — INFLUENZA VAC SPLIT QUAD 0.5 ML IM SUSY
0.5000 mL | PREFILLED_SYRINGE | INTRAMUSCULAR | Status: AC
  Administered 2019-06-27: 0.5 mL via INTRAMUSCULAR
  Filled 2019-06-26: qty 0.5

## 2019-06-26 MED ORDER — NICOTINE 21 MG/24HR TD PT24
21.0000 mg | MEDICATED_PATCH | Freq: Every day | TRANSDERMAL | Status: DC
  Filled 2019-06-26 (×2): qty 1

## 2019-06-26 MED ORDER — SODIUM CHLORIDE 0.9% FLUSH
3.0000 mL | Freq: Two times a day (BID) | INTRAVENOUS | Status: DC
  Administered 2019-06-26 – 2019-06-27 (×3): 3 mL via INTRAVENOUS

## 2019-06-26 MED ORDER — BUMETANIDE 0.25 MG/ML IJ SOLN
1.0000 mg | Freq: Two times a day (BID) | INTRAMUSCULAR | Status: DC
  Administered 2019-06-26: 1 mg via INTRAVENOUS
  Filled 2019-06-26: qty 4

## 2019-06-26 MED ORDER — ONDANSETRON HCL 4 MG/2ML IJ SOLN: 4.0000 mg | Freq: Four times a day (QID) | INTRAMUSCULAR | Status: DC | PRN

## 2019-06-26 MED ORDER — HYDROCODONE-ACETAMINOPHEN 5-325 MG PO TABS
1.0000 | ORAL_TABLET | Freq: Four times a day (QID) | ORAL | Status: DC | PRN
  Administered 2019-06-26 (×2): 1 via ORAL
  Filled 2019-06-26 (×2): qty 1

## 2019-06-26 MED ORDER — SODIUM CHLORIDE 0.9 % IV SOLN: 250.0000 mL | INTRAVENOUS | Status: DC | PRN

## 2019-06-26 MED ORDER — PANTOPRAZOLE SODIUM 20 MG PO TBEC
20.0000 mg | DELAYED_RELEASE_TABLET | Freq: Every day | ORAL | Status: DC
  Administered 2019-06-26: 20 mg via ORAL
  Filled 2019-06-26 (×3): qty 1

## 2019-06-26 MED ORDER — ENOXAPARIN SODIUM 40 MG/0.4ML ~~LOC~~ SOLN
40.0000 mg | SUBCUTANEOUS | Status: DC
  Administered 2019-06-26 – 2019-06-27 (×2): 40 mg via SUBCUTANEOUS
  Filled 2019-06-26 (×2): qty 0.4

## 2019-06-26 MED ORDER — POTASSIUM CHLORIDE 20 MEQ PO PACK
40.0000 meq | PACK | Freq: Once | ORAL | Status: AC
  Administered 2019-06-26: 40 meq via ORAL
  Filled 2019-06-26: qty 2

## 2019-06-26 MED ORDER — ASPIRIN 81 MG PO CHEW
324.0000 mg | CHEWABLE_TABLET | Freq: Once | ORAL | Status: AC
  Administered 2019-06-26: 324 mg via ORAL
  Filled 2019-06-26: qty 4

## 2019-06-26 MED ORDER — ZOLPIDEM TARTRATE 5 MG PO TABS
5.0000 mg | ORAL_TABLET | Freq: Every evening | ORAL | Status: DC | PRN
  Filled 2019-06-26: qty 1

## 2019-06-26 NOTE — ED Notes (Signed)
Patient given meal. 

## 2019-06-26 NOTE — Progress Notes (Signed)
PROGRESS NOTE    Gabriel [name].  ST:[id number] DOB: [birth year] DOA: 06/25/2019 PCP: Alisa Graff, FNP    Brief Narrative:   Gabriel [name]  is a 48 y.o. obese Caucasian male with a known history of chronic systolic and diastolic CHF, type 2 diabetes mellitus, hypertension, COPD and stage III chronic kidney disease as well as coronary artery disease who presented to the emergency room with acute onset of dyspnea with associated orthopnea and paroxysmal nocturnal dyspnea over the last couple of days with no worsening lower extremity edema.  He has been having associated chest pain felt chest tightness and occasionally sharp with cough in the midsternal area with no radiation.  He graded 8/10 in severity with no nausea vomiting or diaphoresis.  He denied any leg pain or recent travels or surgeries.  No headache or dizziness or blurred vision.  No paresthesias or focal muscle weakness.  He has been having urinary frequency without dysuria oliguria or hematuria or flank pain.  Upon presentation to the emergency room, Blood pressure was 187/104 with respiratory to 22 with a pulse 97% on room air and temperature of 90.1.  Labs revealed potassium of 3.6 and blood glucose of 237 and BNP of 948 with high-sensitivity troponin I of 62. Portable chest x-ray showed cardiomegaly with mild diffuse pulmonary interstitial edema.  EKG showed normal sinus rhythm with rate of 96 with suspected left atrial volume, incomplete left bundle branch block and T wave inversion inferiorly.  COVID-19 test is currently pending.  The patient was given 4 baby aspirin, 4 mg of IV morphine sulfate and 4 mg IV Zofran as well as 1 mg of IV Bumex.  He will be admitted to telemetry bed for further evaluation and management.   Interim History:  2d echo 11/7:   1. Left ventricular ejection fraction, by visual estimation, is 30 to 35%. The left ventricle has moderately decreased function. There is no left ventricular  hypertrophy.  2. Definity contrast agent was given IV to delineate the left ventricular endocardial borders.  3. The left ventricle demonstrates global hypokinesis.  4. Global right ventricle has normal systolic function.The right ventricular size is mildly enlarged. No increase in right ventricular wall thickness.  5. Left atrial size was moderately dilated.  6. Moderately elevated pulmonary artery systolic pressure.  7. The tricuspid regurgitant velocity is 3.10 m/s, and with an assumed right atrial pressure of 20 mmHg, the estimated right ventricular systolic pressure is moderately elevated at 58.4 mmHg.  8. Moderately dilated left ventricular internal cavity size.   Assessment & Plan:   Principal Problem:   Acute on chronic combined systolic and diastolic CHF (congestive heart failure) (HCC) Active Problems:   HTN (hypertension)   Elevated troponin   Tobacco use   Chest pain   Acute CHF (congestive heart failure) (H[facility])   CKD (chronic kidney disease), stage IIIa   Type II diabetes mellitus with renal manifestations (HCC)   Hypertensive urgency   Acute on chronic combined systolic and diastolic CHF (congestive heart failure) ([place]): 2D echo on 11/7 showed 30 to 35%, which is worse than previous 45-45 %. Dr. Percival Spanish of card consulted --> "He can be treated with IV Lasix and was given 40 mg twice daily"  -will switch Bumex to IV lasix 40 mg bid - continue Entresto, Toprol-XL and Aldactone  Hypertensive urgency.  Bp is 136/94 this AM. -On clonidine, metoprolol, IV Lasix  Chest pain with elevated troponin I: trop 62 -->58-->65.  Likely demand ischemia. No further work-up needed per Dr. Percival Spanish  Dyslipidemia.   -We will continue statin therapy.  Type II diabetes mellitus with renal manifestations.  - levemir 16 units daily (was on 24 units at home) -SSI  Tobacco use: -nicotine patch   CKD (chronic kidney disease), stage IIIa: stable. Cre 1.06 abd BUN 19 -f/u by BMP     DVT prophylaxis: lovenox Code Status: full Family Communication: none, not at bedside Disposition Plan: possible tomorrow. Pt still has SOB on minimal exertion.  Barriers for discharge:    Consultants:   card  Procedures:    Antimicrobials:    Subjective: Patient is still has shortness of breath, mild dry cough, some front chest pain.  No nausea vomiting, diarrhea or abdominal pain.  No fever or chills   Objective: Vitals:   06/26/19 0630 06/26/19 1046 06/26/19 1047 06/26/19 1048  BP: (!) 136/94 (!) 156/93 (!) 156/93 (!) 156/93  Pulse: 72  84 84  Resp: 16   16  Temp:      TempSrc:      SpO2: 95%   93%  Weight:      Height:       No intake or output data in the 24 hours ending 06/26/19 1529 Filed Weights   06/25/19 2305  Weight: 111.1 kg    Examination: Physical Exam:  General: Not in acute distress HEENT: PERRL, EOMI, no scleral icterus, No JVD or bruit Cardiac: S1/S2, RRR, No murmurs, gallops or rubs Pulm: has fine crackles in base Abd: Soft, nondistended, nontender, no rebound pain, no organomegaly, BS present Ext: has trace leg  edema. 2+DP/PT pulse bilaterally Musculoskeletal: No joint deformities, erythema, or stiffness, ROM full Skin: No rashes.  Neuro: Alert and oriented X3, cranial nerves II-XII grossly intac. Psych: Patient is not psychotic, no suicidal or hemocidal ideation.    Data Reviewed: I have personally reviewed following labs and imaging studies  CBC: Recent Labs  Lab 06/25/19 2310 06/26/19 0624  WBC 8.3 8.4  NEUTROABS  --  4.9  HGB 15.4 15.0  HCT 48.1 45.9  MCV 86.8 86.1  PLT 212 [id number]   Basic Metabolic Panel: Recent Labs  Lab 06/25/19 2310 06/26/19 0202  NA 138  --   K 3.6  --   CL 105  --   CO2 22  --   GLUCOSE 237*  --   BUN 19  --   CREATININE 1.06  --   CALCIUM 8.9  --   MG  --  1.9   GFR: Estimated Creatinine Clearance: 107.3 mL/min (by C-G formula based on SCr of 1.06 mg/dL). Liver Function Tests:  Recent Labs  Lab 06/25/19 2310  AST 22  ALT 41  ALKPHOS 73  BILITOT 1.8*  PROT 6.8  ALBUMIN 3.8   No results for input(s): LIPASE, AMYLASE in the last 168 hours. No results for input(s): AMMONIA in the last 168 hours. Coagulation Profile: No results for input(s): INR, PROTIME in the last 168 hours. Cardiac Enzymes: No results for input(s): CKTOTAL, CKMB, CKMBINDEX, TROPONINI in the last 168 hours. BNP (last 3 results) No results for input(s): PROBNP in the last 8760 hours. HbA1C: No results for input(s): HGBA1C in the last 72 hours. CBG: Recent Labs  Lab 06/26/19 0218  GLUCAP 144*   Lipid Profile: No results for input(s): CHOL, HDL, LDLCALC, TRIG, CHOLHDL, LDLDIRECT in the last 72 hours. Thyroid Function Tests: No results for input(s): TSH, T4TOTAL, FREET4, T3FREE, THYROIDAB in the last 72 hours.  Anemia Panel: No results for input(s): VITAMINB12, FOLATE, FERRITIN, TIBC, IRON, RETICCTPCT in the last 72 hours. Sepsis Labs: No results for input(s): PROCALCITON, LATICACIDVEN in the last 168 hours.  Recent Results (from the past 240 hour(s))  SARS CORONAVIRUS 2 (TAT 6-24 HRS) Nasopharyngeal Nasopharyngeal Swab     Status: None   Collection Time: 06/26/19  2:00 AM   Specimen: Nasopharyngeal Swab  Result Value Ref Range Status   SARS Coronavirus 2 NEGATIVE NEGATIVE Final    Comment: (NOTE) SARS-CoV-2 target nucleic acids are NOT DETECTED. The SARS-CoV-2 RNA is generally detectable in upper and lower respiratory specimens during the acute phase of infection. Negative results do not preclude SARS-CoV-2 infection, do not rule out co-infections with other pathogens, and should not be used as the sole basis for treatment or other patient management decisions. Negative results must be combined with clinical observations, patient history, and epidemiological information. The expected result is Negative. Fact Sheet for Patients: SugarRoll[employer]be Fact  Sheet for Healthcare Providers: https://www.woods-mathews.com/ This test is not yet approved or cleared by the [place] FDA and  has been authorized for detection and/or diagnosis of SARS-CoV-2 by FDA under an Emergency Use Authorization (EUA). This EUA will remain  in effect (meaning this test can be used) for the duration of the COVID-19 declaration under Section 56 4(b)(1) of the Act, 21 U.S.C. section 360bbb-3(b)(1), unless the authorization is terminated or revoked sooner. Performed at [facility], [place] [address] [place], [place] [address]       Radiology Studies: Dg Chest 2 View  Result Date: 06/25/2019 CLINICAL DATA:  Initial evaluation for acute chest pain. EXAM: CHEST - 2 VIEW COMPARISON:  Prior radiograph from 11/19/2018. FINDINGS: Cardiomegaly, stable.  Mediastinal silhouette within normal limits. Lungs normally inflated. Diffuse pulmonary vascular congestion with interstitial prominence, compatible with diffuse pulmonary interstitial edema. Superimposed streaky bibasilar atelectasis. No consolidative opacity. No pleural effusion. No pneumothorax. No acute osseous finding. IMPRESSION: 1. Cardiomegaly with mild diffuse pulmonary interstitial edema. 2. No other active cardiopulmonary disease. Electronically Signed   By: Jeannine Boga M.D.   On: 06/25/2019 23:42        Scheduled Meds: . aspirin  81 mg Oral Daily  . cloNIDine  0.1 mg Oral BID  . enoxaparin (LOVENOX) injection  40 mg Subcutaneous Q24H  . furosemide  40 mg Intravenous Q12H  . insulin aspart  0-9 Units Subcutaneous Q4H  . insulin detemir  16 Units Subcutaneous QHS  . metoprolol  100 mg Oral BID  . nicotine  21 mg Transdermal Daily  . pantoprazole  20 mg Oral QHS  . rosuvastatin  20 mg Oral Daily  . sacubitril-valsartan  1 tablet Oral BID  . sodium chloride flush  3 mL Intravenous Q12H  . spironolactone  25 mg Oral BID   Continuous Infusions: . sodium chloride       LOS:  0 days    Time spent: [place], DO [facility] PAGER is on AMION  If 7PM-7AM, please contact night-coverage [email or web address] Password [facility] 06/26/2019, 3:29 PM

## 2019-06-26 NOTE — ED Notes (Signed)
Pt given meal tray.

## 2019-06-26 NOTE — ED Notes (Signed)
Pt resting on stretcher with lights off to enhance rest. No acute distress noted at this time. Pt states he is feeling a little better. Provided for comfort and safety and will continue to assess.

## 2019-06-26 NOTE — H&P (Addendum)
[facility] NAME: Ameen Chico    MR#:  [id number]  DATE OF BIRTH:  1970-11-26  DATE OF ADMISSION:  06/26/2019  PRIMARY CARE PHYSICIAN: Alisa Graff, FNP   REQUESTING/REFERRING PHYSICIAN: Lurline Hare, MD  CHIEF COMPLAINT:   Chief Complaint  Patient presents with  . Chest Pain  . Shortness of Breath    HISTORY OF PRESENT ILLNESS:  Bracen Sittner  is a 48 y.o. obese Caucasian male with a known history of chronic systolic and diastolic CHF, type 2 diabetes mellitus, hypertension, COPD and stage III chronic kidney disease as well as coronary artery disease who presented to the emergency room with acute onset of dyspnea with associated orthopnea and paroxysmal nocturnal dyspnea over the last couple of days with no worsening lower extremity edema.  He has been having associated chest pain felt chest tightness and occasionally sharp with cough in the midsternal area with no radiation.  He graded 8/10 in severity with no nausea vomiting or diaphoresis.  He denied any leg pain or recent travels or surgeries.  No headache or dizziness or blurred vision.  No paresthesias or focal muscle weakness.  He has been having urinary frequency without dysuria oliguria or hematuria or flank pain.  Upon presentation to the emergency room, Blood pressure was 187/104 with respiratory to 22 with a pulse 97% on room air and temperature of 90.1.  Labs revealed potassium of 3.6 and blood glucose of 237 and BNP of 948 with high-sensitivity troponin I of 62. Portable chest x-ray showed cardiomegaly with mild diffuse pulmonary interstitial edema.  EKG showed normal sinus rhythm with rate of 96 with suspected left atrial volume, incomplete left bundle branch block and T wave inversion inferiorly.  COVID-19 test is currently pending.  The patient was given 4 baby aspirin, 4 mg of IV morphine sulfate and 4 mg IV Zofran as well as 1 mg of IV Bumex.  He will be admitted to telemetry bed for  further evaluation and management.  PAST MEDICAL HISTORY:   Past Medical History:  Diagnosis Date  . Chest wall pain, chronic   . Chronic systolic CHF (congestive heart failure) ([place])    a. 03/2015 Echo: EF 45-50%; b. 12/2015 Echo: EF 20-25%; c. 02/2016 Echo: EF 30-35%; d. 11/2016 Echo: EF 40-45%.  . Chronic Troponin Elevation   . CKD (chronic kidney disease), stage III   . COPD (chronic obstructive pulmonary disease) ([place])   . Diabetes mellitus without complication ([place])   . Hypertension    Resolved since weight loss  . Myocardial infarction ([place])   . NICM (nonischemic cardiomyopathy) ([place])    a. 03/2015 Echo: EF 45-50%; b. 04/2015 MV: small defect of mild severity in apex 2/2 apical thinning, EF 30-44%;  c. 12/2015 Echo: EF 20-25%; d. 02/2016 Echo: EF 30-35%; e. 11/2016 Echo: EF 40-45%, diff HK, mildly dil Ao root, mild MR, mod dil LA;  f. 12/2016 Cath: LM nl, LAD/Diags/LCX/OMs min irregs, RCA 40p/m/d.  . Non-obstructive CAD (coronary artery disease)    a. 04/2015 low risk MV;  b. 12/2016 Cath: minor irregs in LAD/Diag/LCX/OM, RCA 40p/m/d.  [name] (nonsustained ventricular tachycardia) ([place])    a. 12/2015 noted on tele-->amio;  b. 12/2015 Event monitor: no VT. Atach noted.  . Obesity (BMI 30.0-34.9)   . Psoriasis   . Renal insufficiency   . Syncope    a. 01/2016 - felt to be vasovagal.  . Tobacco abuse     PAST  SURGICAL HISTORY:   Past Surgical History:  Procedure Laterality Date  . AMPUTATION    . CARDIAC CATHETERIZATION    . FINGER AMPUTATION     Traumatic  . FINGER FRACTURE SURGERY Left   . LEFT HEART CATH AND CORONARY ANGIOGRAPHY N/A 01/06/2017   Procedure: Left Heart Cath and Coronary Angiography;  Surgeon: Wellington Hampshire, MD;  Location: [facility];  Service: Cardiovascular;  Laterality: N/A;    SOCIAL HISTORY:   Social History   Tobacco Use  . Smoking status: Current Every Day Smoker    Packs/day: 1.00    Years: 33.00    Pack years: 33.00    Types: Cigarettes   . Smokeless tobacco: Never Used  . Tobacco comment: Quit a little over a month ago.  Substance Use Topics  . Alcohol use: Yes    Alcohol/week: 0.0 standard drinks    Frequency: Never    Comment: occassionally    FAMILY HISTORY:   Family History  Problem Relation Age of Onset  . Diabetes Mellitus II Mother   . Hypertension Father   . Cancer Paternal Aunt   . Cancer Maternal Grandfather   . Diabetes Maternal Grandfather     DRUG ALLERGIES:   Allergies  Allergen Reactions  . Prednisone Other (See Comments)    Reaction: Hallucinations    . Zantac [Ranitidine Hcl] Diarrhea and Nausea Only    Night sweats    REVIEW OF SYSTEMS:   ROS As per history of present illness. All pertinent systems were reviewed above. Constitutional,  HEENT, cardiovascular, respiratory, GI, GU, musculoskeletal, neuro, psychiatric, endocrine,  integumentary and hematologic systems were reviewed and are otherwise  negative/unremarkable except for positive findings mentioned above in the HPI.   MEDICATIONS AT HOME:   Prior to Admission medications   Medication Sig Start Date End Date Taking? Authorizing Provider  aspirin 81 MG chewable tablet Chew 1 tablet (81 mg total) by mouth daily. Reported on 02/08/2016 08/27/18   Alisa Graff, FNP  cloNIDine (CATAPRES) 0.1 MG tablet Take 1 tablet (0.1 mg total) by mouth 2 (two) times daily. 05/10/19   Alisa Graff, FNP  HYDROcodone-acetaminophen (NORCO/VICODIN) 5-325 MG tablet Take 1 tablet by mouth every 6 (six) hours as needed for moderate pain. 05/24/19   Johnn Hai, PA-C  insulin detemir (LEVEMIR) 100 UNIT/ML injection Inject 0.24 mLs (24 Units total) into the skin at bedtime. 09/30/18   Tawni Millers, MD  metFORMIN (GLUCOPHAGE) 500 MG tablet Take 1 tablet (500mg ) with breakfast and 2 tablets (500mg ) at dinner. 04/07/19   Tawni Millers, MD  metoprolol (TOPROL XL) 200 MG 24 hr tablet Take 1 tablet (200 mg total) by mouth daily. Patient taking  differently: Take 100 mg by mouth 2 (two) times daily.  08/27/18   Alisa Graff, FNP  naproxen (NAPROSYN) 500 MG tablet Take 1 tablet (500 mg total) by mouth 2 (two) times daily with a meal. 05/24/19   Letitia Neri L, PA-C  pantoprazole (PROTONIX) 20 MG tablet Take 1 tablet (20 mg total) by mouth at bedtime. 09/09/18   Tawni Millers, MD  rosuvastatin (CRESTOR) 20 MG tablet Take 1 tablet (20 mg total) by mouth daily. 08/27/18   Alisa Graff, FNP  sacubitril-valsartan (ENTRESTO) 97-103 MG Take 1 tablet by mouth 2 (two) times daily. 08/27/18   Alisa Graff, FNP  spironolactone (ALDACTONE) 25 MG tablet TAKE ONE TABLET BY MOUTH 2 TIMES A DAY 02/24/19   Tawni Millers,  MD  torsemide (DEMADEX) 20 MG tablet Take 2 tablets (40 mg total) by mouth daily. 03/03/19   Alisa Graff, FNP      VITAL SIGNS:  Blood pressure (!) 187/104, pulse 95, temperature 98.1 F (36.7 C), temperature source Oral, resp. rate (!) 22, height 6' (1.829 m), weight 111.1 kg, SpO2 97 %.  PHYSICAL EXAMINATION:  Physical Exam  GENERAL:  48 y.o.-year-old obese Caucasian male patient lying in the bed in mild respiratory distress with conversational dyspnea. EYES: Pupils equal, round, reactive to light and accommodation. No scleral icterus. Extraocular muscles intact.  HEENT: Head atraumatic, normocephalic. Oropharynx and nasopharynx clear.  NECK:  Supple, no jugular venous distention. No thyroid enlargement, no tenderness.  LUNGS: Slightly diminished bibasilar breath sounds with bibasal rales. CARDIOVASCULAR: Regular rate and rhythm, S1, S2 normal. No murmurs, rubs, or gallops.  ABDOMEN: Soft, nondistended, nontender. Bowel sounds present. No organomegaly or mass.  EXTREMITIES: Trace minimal bilateral lower extremity pitting edema with no clubbing or cyanosis.  NEUROLOGIC: Cranial nerves II through XII are intact. Muscle strength 5/5 in all extremities. Sensation intact. Gait not checked.  PSYCHIATRIC: The patient is alert and  oriented x 3.  Normal affect and good eye contact. SKIN: No obvious rash, lesion, or ulcer.   LABORATORY PANEL:   CBC Recent Labs  Lab 06/25/19 2310  WBC 8.3  HGB 15.4  HCT 48.1  PLT 212   ------------------------------------------------------------------------------------------------------------------  Chemistries  Recent Labs  Lab 06/25/19 2310  NA 138  K 3.6  CL 105  CO2 22  GLUCOSE 237*  BUN 19  CREATININE 1.06  CALCIUM 8.9  AST 22  ALT 41  ALKPHOS 73  BILITOT 1.8*   ------------------------------------------------------------------------------------------------------------------  Cardiac Enzymes No results for input(s): TROPONINI in the last 168 hours. ------------------------------------------------------------------------------------------------------------------  RADIOLOGY:  Dg Chest 2 View  Result Date: 06/25/2019 CLINICAL DATA:  Initial evaluation for acute chest pain. EXAM: CHEST - 2 VIEW COMPARISON:  Prior radiograph from 11/19/2018. FINDINGS: Cardiomegaly, stable.  Mediastinal silhouette within normal limits. Lungs normally inflated. Diffuse pulmonary vascular congestion with interstitial prominence, compatible with diffuse pulmonary interstitial edema. Superimposed streaky bibasilar atelectasis. No consolidative opacity. No pleural effusion. No pneumothorax. No acute osseous finding. IMPRESSION: 1. Cardiomegaly with mild diffuse pulmonary interstitial edema. 2. No other active cardiopulmonary disease. Electronically Signed   By: Jeannine Boga M.D.   On: 06/25/2019 23:42      IMPRESSION AND PLAN:   1.  Acute on chronic combined systolic and diastolic CHF.  The patient has EF was 45 to 50% in 2018 with grade 1 systolic dysfunction. He will be admitted to a telemetry bed and will be diuresed with IV Bumex.  Will follow serial cardiac enzymes.  Will obtain a 2D echo  and a cardiology consultation in a.m. I notified Dr. Curt Bears about the consult.. We  will continue Entresto, Toprol-XL and Aldactone and stop Naprosyn.  2.  Hypertensive urgency.  This is likely the culprit for #1.  We will continue his antihypertensive and place him on as needed IV labetalol.  3.  Chest pain with elevated troponin I, rule out acute coronary syndrome.  I believe that this is likely demand ischemia.  He had a similar elevated troponin I back in April of this year.  We will follow serial troponin I and obtain a 2D echo as mentioned above.  Cardiology consultation will be obtained as mentioned above.  The patient will be placed on as needed sublingual nitroglycerin and morphine sulfate for pain.  4.  Dyslipidemia.  We will continue statin therapy.  5.  Type 2 diabetes mellitus.  The patient will be placed on supplemental coverage with NovoLog and we will stop metformin.  6.  DVT prophylaxis.  Subcutaneous Lovenox.   All the records are reviewed and case discussed with ED provider. The plan of care was discussed in details with the patient (and family). I answered all questions. The patient agreed to proceed with the above mentioned plan. Further management will depend upon hospital course.   CODE STATUS: Full code  TOTAL TIME TAKING CARE OF THIS PATIENT: 55 minutes.    Christel Mormon M.D on 06/26/2019 at 12:38 AM  [facility]   From 7 PM-7 AM, contact night-coverage [email or web address]  CC: Primary care physician; Alisa Graff, FNP   Note: This dictation was prepared with Dragon dictation along with smaller phrase technology. Any transcriptional errors that result from this process are unintentional.

## 2019-06-26 NOTE — ED Notes (Signed)
Pt given dinner tray and drink 

## 2019-06-26 NOTE — ED Provider Notes (Signed)
[facility] Emergency Department Provider Note   ____________________________________________   First MD Initiated Contact with Patient 06/26/19 0002     (approximate)  I have reviewed the triage vital signs and the nursing notes.   HISTORY  Chief Complaint Chest Pain and Shortness of Breath    HPI Gabriel [name]. is a 48 y.o. male who presents to the ED from home with a chief complaint of chest pain and shortness of breath.  Patient has a history of CHF, COPD, CKD, diabetes, hypertension, status post MI, nonischemic cardiomyopathy who has been having the above symptoms for the past 2 days.  Symptoms are worse on exertion and laying supine.  Denies fever, cough, abdominal pain, nausea, vomiting, diaphoresis.  Denies recent travel or trauma.       Past Medical History:  Diagnosis Date  . Chest wall pain, chronic   . Chronic systolic CHF (congestive heart failure) ([place])    a. 03/2015 Echo: EF 45-50%; b. 12/2015 Echo: EF 20-25%; c. 02/2016 Echo: EF 30-35%; d. 11/2016 Echo: EF 40-45%.  . Chronic Troponin Elevation   . CKD (chronic kidney disease), stage III   . COPD (chronic obstructive pulmonary disease) ([place])   . Diabetes mellitus without complication ([place])   . Hypertension    Resolved since weight loss  . Myocardial infarction ([place])   . NICM (nonischemic cardiomyopathy) ([place])    a. 03/2015 Echo: EF 45-50%; b. 04/2015 MV: small defect of mild severity in apex 2/2 apical thinning, EF 30-44%;  c. 12/2015 Echo: EF 20-25%; d. 02/2016 Echo: EF 30-35%; e. 11/2016 Echo: EF 40-45%, diff HK, mildly dil Ao root, mild MR, mod dil LA;  f. 12/2016 Cath: LM nl, LAD/Diags/LCX/OMs min irregs, RCA 40p/m/d.  . Non-obstructive CAD (coronary artery disease)    a. 04/2015 low risk MV;  b. 12/2016 Cath: minor irregs in LAD/Diag/LCX/OM, RCA 40p/m/d.  [name] (nonsustained ventricular tachycardia) ([place])    a. 12/2015 noted on tele-->amio;  b. 12/2015 Event monitor: no VT. Atach noted.   . Obesity (BMI 30.0-34.9)   . Psoriasis   . Renal insufficiency   . Syncope    a. 01/2016 - felt to be vasovagal.  . Tobacco abuse     Patient Active Problem List   Diagnosis Date Noted  . Chronic systolic heart failure ([place]) 09/02/2017  . Chest pain 08/24/2017  . Dizziness   . Noncompliance with medications 04/29/2017  . Back pain 03/06/2017  . Tobacco use 12/04/2016  . Gallbladder sludge 12/04/2016  . Coronary artery disease, non-occlusive 03/15/2016  . Atypical chest pain 02/16/2016  . Orthostatic hypotension 01/23/2016  . Hypomagnesemia 01/23/2016  . Congestive dilated cardiomyopathy ([place]) 01/17/2016  . NSVT (nonsustained ventricular tachycardia) ([place])   . Elevated troponin   . Diabetes ([place]) 05/17/2015  . Hyperlipidemia 05/17/2015  . COPD (chronic obstructive pulmonary disease) ([place]) 05/17/2015  . HTN (hypertension) 03/31/2015    Past Surgical History:  Procedure Laterality Date  . AMPUTATION    . CARDIAC CATHETERIZATION    . FINGER AMPUTATION     Traumatic  . FINGER FRACTURE SURGERY Left   . LEFT HEART CATH AND CORONARY ANGIOGRAPHY N/A 01/06/2017   Procedure: Left Heart Cath and Coronary Angiography;  Surgeon: Wellington Hampshire, MD;  Location: [facility];  Service: Cardiovascular;  Laterality: N/A;    Prior to Admission medications   Medication Sig Start Date End Date Taking? Authorizing Provider  aspirin 81 MG chewable tablet Chew 1 tablet (81  mg total) by mouth daily. Reported on 02/08/2016 08/27/18   Alisa Graff, FNP  cloNIDine (CATAPRES) 0.1 MG tablet Take 1 tablet (0.1 mg total) by mouth 2 (two) times daily. 05/10/19   Alisa Graff, FNP  HYDROcodone-acetaminophen (NORCO/VICODIN) 5-325 MG tablet Take 1 tablet by mouth every 6 (six) hours as needed for moderate pain. 05/24/19   Johnn Hai, PA-C  insulin detemir (LEVEMIR) 100 UNIT/ML injection Inject 0.24 mLs (24 Units total) into the skin at bedtime. 09/30/18   Tawni Millers, MD  metFORMIN  (GLUCOPHAGE) 500 MG tablet Take 1 tablet (500mg ) with breakfast and 2 tablets (500mg ) at dinner. 04/07/19   Tawni Millers, MD  metoprolol (TOPROL XL) 200 MG 24 hr tablet Take 1 tablet (200 mg total) by mouth daily. Patient taking differently: Take 100 mg by mouth 2 (two) times daily.  08/27/18   Alisa Graff, FNP  naproxen (NAPROSYN) 500 MG tablet Take 1 tablet (500 mg total) by mouth 2 (two) times daily with a meal. 05/24/19   Letitia Neri L, PA-C  pantoprazole (PROTONIX) 20 MG tablet Take 1 tablet (20 mg total) by mouth at bedtime. 09/09/18   Tawni Millers, MD  rosuvastatin (CRESTOR) 20 MG tablet Take 1 tablet (20 mg total) by mouth daily. 08/27/18   Alisa Graff, FNP  sacubitril-valsartan (ENTRESTO) 97-103 MG Take 1 tablet by mouth 2 (two) times daily. 08/27/18   Alisa Graff, FNP  spironolactone (ALDACTONE) 25 MG tablet TAKE ONE TABLET BY MOUTH 2 TIMES A DAY 02/24/19   Tawni Millers, MD  torsemide (DEMADEX) 20 MG tablet Take 2 tablets (40 mg total) by mouth daily. 03/03/19   Alisa Graff, FNP    Allergies Prednisone and Zantac [ranitidine hcl]  Family History  Problem Relation Age of Onset  . Diabetes Mellitus II Mother   . Hypertension Father   . Cancer Paternal Aunt   . Cancer Maternal Grandfather   . Diabetes Maternal Grandfather     Social History Social History   Tobacco Use  . Smoking status: Current Every Day Smoker    Packs/day: 1.00    Years: 33.00    Pack years: 33.00    Types: Cigarettes  . Smokeless tobacco: Never Used  . Tobacco comment: Quit a little over a month ago.  Substance Use Topics  . Alcohol use: Yes    Alcohol/week: 0.0 standard drinks    Frequency: Never    Comment: occassionally  . Drug use: No    Review of Systems  Constitutional: No fever/chills Eyes: No visual changes. ENT: No sore throat. Cardiovascular: Positive for chest pain. Respiratory: Positive for shortness of breath. Gastrointestinal: No abdominal pain.  No nausea, no  vomiting.  No diarrhea.  No constipation. Genitourinary: Negative for dysuria. Musculoskeletal: Negative for back pain. Skin: Negative for rash. Neurological: Negative for headaches, focal weakness or numbness.   ____________________________________________   PHYSICAL EXAM:  VITAL SIGNS: ED Triage Vitals [06/25/19 2305]  Enc Vitals Group     BP (!) 187/104     Pulse Rate 95     Resp (!) 22     Temp 98.1 F (36.7 C)     Temp Source Oral     SpO2 97 %     Weight 245 lb (111.1 kg)     Height 6' (1.829 m)     Head Circumference      Peak Flow      Pain Score 10  Pain Loc      Pain Edu?      Excl. in [place]?     Constitutional: Alert and oriented. Well appearing and in no acute distress. Eyes: Conjunctivae are normal. PERRL. EOMI. Head: Atraumatic. Nose: No congestion/rhinnorhea. Mouth/Throat: Mucous membranes are moist.  Oropharynx non-erythematous. Neck: No stridor.   Cardiovascular: Normal rate, regular rhythm. Grossly normal heart sounds.  Good peripheral circulation. Respiratory: Normal respiratory effort.  No retractions. Lungs with faint bibasilar rales. Gastrointestinal: Soft and nontender. No distention. No abdominal bruits. No CVA tenderness. Musculoskeletal: No lower extremity tenderness.  1+ BLE nonpitting edema.  No joint effusions. Neurologic:  Normal speech and language. No gross focal neurologic deficits are appreciated.  Skin:  Skin is warm, dry and intact. No rash noted. Psychiatric: Mood and affect are normal. Speech and behavior are normal.  ____________________________________________   LABS (all labs ordered are listed, but only abnormal results are displayed)  Labs Reviewed  BASIC METABOLIC PANEL - Abnormal; Notable for the following components:      Result Value   Glucose, Bld 237 (*)    All other components within normal limits  BRAIN NATRIURETIC PEPTIDE - Abnormal; Notable for the following components:   B Natriuretic Peptide 948.0 (*)     All other components within normal limits  HEPATIC FUNCTION PANEL - Abnormal; Notable for the following components:   Total Bilirubin 1.8 (*)    Indirect Bilirubin 1.6 (*)    All other components within normal limits  TROPONIN I (HIGH SENSITIVITY) - Abnormal; Notable for the following components:   Troponin I (High Sensitivity) 62 (*)    All other components within normal limits  SARS CORONAVIRUS 2 (TAT 6-24 HRS)  CBC   ____________________________________________  EKG  ED ECG REPORT I, SUNG,JADE J, the attending physician, personally viewed and interpreted this ECG.   Date: 06/26/2019  EKG Time: 2306  Rate: 96  Rhythm: normal EKG, normal sinus rhythm  Axis: Normal  Intervals:none  ST&T Change: Nonspecific  ____________________________________________  RADIOLOGY  ED MD interpretation: Cardiomegaly with pulmonary edema  Official radiology report(s): Dg Chest 2 View  Result Date: 06/25/2019 CLINICAL DATA:  Initial evaluation for acute chest pain. EXAM: CHEST - 2 VIEW COMPARISON:  Prior radiograph from 11/19/2018. FINDINGS: Cardiomegaly, stable.  Mediastinal silhouette within normal limits. Lungs normally inflated. Diffuse pulmonary vascular congestion with interstitial prominence, compatible with diffuse pulmonary interstitial edema. Superimposed streaky bibasilar atelectasis. No consolidative opacity. No pleural effusion. No pneumothorax. No acute osseous finding. IMPRESSION: 1. Cardiomegaly with mild diffuse pulmonary interstitial edema. 2. No other active cardiopulmonary disease. Electronically Signed   By: Jeannine Boga M.D.   On: 06/25/2019 23:42    ____________________________________________   PROCEDURES  Procedure(s) performed (including Critical Care):  Procedures   ____________________________________________   INITIAL IMPRESSION / ASSESSMENT AND PLAN / ED COURSE  As part of my medical decision making, I reviewed the following data within the  [place] notes reviewed and incorporated, Labs reviewed, EKG interpreted, Old chart reviewed, Radiograph reviewed, Discussed with admitting physician and Notes from prior ED visits     Gabriel [name]. was evaluated in Emergency Department on 06/26/2019 for the symptoms described in the history of present illness. He was evaluated in the context of the global COVID-19 pandemic, which necessitated consideration that the patient might be at risk for infection with the SARS-CoV-2 virus that causes COVID-19. Institutional protocols and algorithms that pertain to the evaluation of patients at risk for COVID-19 are  in a state of rapid change based on information released by regulatory bodies including the CDC and federal and state organizations. These policies and algorithms were followed during the patient's care in the ED.    48 year old male with history of MI, nonischemic cardiomyopathy, CHF who presents with chest pain and shortness of breath. Differential diagnosis includes, but is not limited to, ACS, aortic dissection, pulmonary embolism, cardiac tamponade, pneumothorax, pneumonia, pericarditis, myocarditis, GI-related causes including esophagitis/gastritis, and musculoskeletal chest wall pain.    Initial troponin 62.  In April he was 0.05.  Chest x-ray consistent with pulmonary edema.  Will administer aspirin, IV morphine, IV Bumex.  Discussed with hospitalist to evaluate patient in emergency department for admission.      ____________________________________________   FINAL CLINICAL IMPRESSION(S) / ED DIAGNOSES  Final diagnoses:  Shortness of breath  Acute on chronic congestive heart failure, unspecified heart failure type ([place])  Chest pain, unspecified type     ED Discharge Orders    None       Note:  This document was prepared using Dragon voice recognition software and may include unintentional dictation errors.   Paulette Blanch, MD 06/26/19 [phone]

## 2019-06-26 NOTE — Consult Note (Signed)
Cardiology Consultation:   Patient ID: [name].; [id number]; January 23, 1971   Admit date: 06/25/2019 Date of Consult: 06/26/2019  Primary Care Provider: Alisa Graff, FNP Primary Cardiologist: [name], MD  Primary Electrophysiologist:  None   Patient Profile:   Gabriel [name]. is a 48 y.o. male with a hx of non ischemic cardiomyopathy who is being seen today for the evaluation of dyspnea at the request of Dr. Blaine Hamper.  History of Present Illness:   [name] has a history of nonischemic cardiomyopathy with EF of 45 - 50%  at the last evaluation in 2018  .  He had had an EF as low as 25% in the past.    Of note his history includes acute on chronic renal insufficiency.  There is also a mention of a chronic trop elevation and NSVT.    He had a cath in 2018 without obstructive CAD.  He has mild diffuse disease with focal RCA 40 % stenosis.    He presented with SOB.  He reports chronic chest pain.  He has had this for years.  It is across his chest.  Seems to be worse with deep breathing and movement.  He coughs he will get some sharp discomfort.  He has been having this though more intensely recently which is one of the reasons he presented to the emergency room.  This is the same pain he had in the past when he had nonobstructive coronary disease.  He has also had increased shortness of breath over the past 2 to 3 days.  He does not weigh himself routinely.  He says he watches his salt.  He is not describing new PND or orthopnea.  However, he is having more resting shortness of breath.  He has a cough nonproductive.  He is not had any fevers or chills.  He said no edema.  He says he takes all his medications..  He was found to be hypertensive in the ED.  He had normal O2 sat on RA.  No acute EKG changes.  He was treated with IV Bumex, morphine and Zofran.   High sensitivity trop had minimal elevation and was not clinically significant with flat trend.   CXR did have mild edema.   Echo was ordered and pending.    Past Medical History:  Diagnosis Date  . Chest wall pain, chronic   . Chronic systolic CHF (congestive heart failure) ([place])    a. 03/2015 Echo: EF 45-50%; b. 12/2015 Echo: EF 20-25%; c. 02/2016 Echo: EF 30-35%; d. 11/2016 Echo: EF 40-45%.  . Chronic Troponin Elevation   . CKD (chronic kidney disease), stage III   . COPD (chronic obstructive pulmonary disease) ([place])   . Diabetes mellitus without complication ([place])   . Hypertension    Resolved since weight loss  . NICM (nonischemic cardiomyopathy) ([place])    a. 03/2015 Echo: EF 45-50%; b. 04/2015 MV: small defect of mild severity in apex 2/2 apical thinning, EF 30-44%;  c. 12/2015 Echo: EF 20-25%; d. 02/2016 Echo: EF 30-35%; e. 11/2016 Echo: EF 40-45%, diff HK, mildly dil Ao root, mild MR, mod dil LA;  f. 12/2016 Cath: LM nl, LAD/Diags/LCX/OMs min irregs, RCA 40p/m/d.  . Non-obstructive CAD (coronary artery disease)    a. 04/2015 low risk MV;  b. 12/2016 Cath: minor irregs in LAD/Diag/LCX/OM, RCA 40p/m/d.  [name] (nonsustained ventricular tachycardia) ([place])    a. 12/2015 noted on tele-->amio;  b. 12/2015 Event monitor: no VT. Atach  noted.  . Obesity (BMI 30.0-34.9)   . Psoriasis   . Renal insufficiency   . Syncope    a. 01/2016 - felt to be vasovagal.  . Tobacco abuse     Past Surgical History:  Procedure Laterality Date  . AMPUTATION    . CARDIAC CATHETERIZATION    . FINGER AMPUTATION     Traumatic  . FINGER FRACTURE SURGERY Left   . LEFT HEART CATH AND CORONARY ANGIOGRAPHY N/A 01/06/2017   Procedure: Left Heart Cath and Coronary Angiography;  Surgeon: Wellington Hampshire, MD;  Location: [facility];  Service: Cardiovascular;  Laterality: N/A;     Home Medications:  Prior to Admission medications   Medication Sig Start Date End Date Taking? Authorizing Provider  aspirin 81 MG chewable tablet Chew 1 tablet (81 mg total) by mouth daily. Reported on 02/08/2016 08/27/18   Alisa Graff, FNP  cloNIDine  (CATAPRES) 0.1 MG tablet Take 1 tablet (0.1 mg total) by mouth 2 (two) times daily. 05/10/19   Alisa Graff, FNP  HYDROcodone-acetaminophen (NORCO/VICODIN) 5-325 MG tablet Take 1 tablet by mouth every 6 (six) hours as needed for moderate pain. 05/24/19   Johnn Hai, PA-C  insulin detemir (LEVEMIR) 100 UNIT/ML injection Inject 0.24 mLs (24 Units total) into the skin at bedtime. 09/30/18   Tawni Millers, MD  metFORMIN (GLUCOPHAGE) 500 MG tablet Take 1 tablet (500mg ) with breakfast and 2 tablets (500mg ) at dinner. 04/07/19   Tawni Millers, MD  metoprolol (TOPROL XL) 200 MG 24 hr tablet Take 1 tablet (200 mg total) by mouth daily. Patient taking differently: Take 100 mg by mouth 2 (two) times daily.  08/27/18   Alisa Graff, FNP  naproxen (NAPROSYN) 500 MG tablet Take 1 tablet (500 mg total) by mouth 2 (two) times daily with a meal. 05/24/19   Letitia Neri L, PA-C  pantoprazole (PROTONIX) 20 MG tablet Take 1 tablet (20 mg total) by mouth at bedtime. 09/09/18   Tawni Millers, MD  rosuvastatin (CRESTOR) 20 MG tablet Take 1 tablet (20 mg total) by mouth daily. 08/27/18   Alisa Graff, FNP  sacubitril-valsartan (ENTRESTO) 97-103 MG Take 1 tablet by mouth 2 (two) times daily. 08/27/18   Alisa Graff, FNP  spironolactone (ALDACTONE) 25 MG tablet TAKE ONE TABLET BY MOUTH 2 TIMES A DAY 02/24/19   Tawni Millers, MD  torsemide (DEMADEX) 20 MG tablet Take 2 tablets (40 mg total) by mouth daily. 03/03/19   Alisa Graff, FNP    Inpatient Medications: Scheduled Meds: . aspirin  81 mg Oral Daily  . bumetanide  1 mg Intravenous Q12H  . cloNIDine  0.1 mg Oral BID  . enoxaparin (LOVENOX) injection  40 mg Subcutaneous Q24H  . insulin detemir  24 Units Subcutaneous QHS  . metoprolol  100 mg Oral BID  . nicotine  21 mg Transdermal Daily  . pantoprazole  20 mg Oral QHS  . rosuvastatin  20 mg Oral Daily  . sacubitril-valsartan  1 tablet Oral BID  . sodium chloride flush  3 mL Intravenous Q12H  .  spironolactone  25 mg Oral BID   Continuous Infusions: . sodium chloride     PRN Meds: sodium chloride, acetaminophen, HYDROcodone-acetaminophen, morphine injection, nitroGLYCERIN, ondansetron (ZOFRAN) IV, sodium chloride flush, zolpidem  Allergies:    Allergies  Allergen Reactions  . Prednisone Other (See Comments)    Reaction: Hallucinations    . Zantac [Ranitidine Hcl] Diarrhea and Nausea Only  Night sweats    Social History:   Social History   Socioeconomic History  . Marital status: Widowed    Spouse name: Not on file  . Number of children: 1  . Years of education: 63  . Highest education level: 12th grade  Occupational History  . Occupation: disabled  Social Needs  . Financial resource strain: Somewhat hard  . Food insecurity    Worry: Often true    Inability: Sometimes true  . Transportation needs    Medical: No    Non-medical: No  Tobacco Use  . Smoking status: Current Every Day Smoker    Packs/day: 1.00    Years: 33.00    Pack years: 33.00    Types: Cigarettes  . Smokeless tobacco: Never Used  . Tobacco comment: Quit a little over a month ago.  Substance and Sexual Activity  . Alcohol use: Yes    Alcohol/week: 0.0 standard drinks    Frequency: Never    Comment: occassionally  . Drug use: No  . Sexual activity: Not Currently  Lifestyle  . Physical activity    Days per week: 7 days    Minutes per session: 10 min  . Stress: Very much  Relationships  . Social Herbalist on phone: Twice a week    Gets together: Once a week    Attends religious service: Never    Active member of club or organization: No    Attends meetings of clubs or organizations: Never    Relationship status: Widowed  . Intimate partner violence    Fear of current or ex partner: No    Emotionally abused: No    Physically abused: No    Forced sexual activity: No  Other Topics Concern  . Not on file  Social History Narrative       Family History:    Family  History  Problem Relation Age of Onset  . Diabetes Mellitus II Mother   . Hypertension Father   . Cancer Paternal Aunt   . Cancer Maternal Grandfather   . Diabetes Maternal Grandfather      ROS:  Please see the history of present illness.  ROS  All other ROS reviewed and negative.     Physical Exam/Data:   Vitals:   06/26/19 0630 06/26/19 1046 06/26/19 1047 06/26/19 1048  BP: (!) 136/94 (!) 156/93 (!) 156/93 (!) 156/93  Pulse: 72  84 84  Resp: 16   16  Temp:      TempSrc:      SpO2: 95%   93%  Weight:      Height:       No intake or output data in the 24 hours ending 06/26/19 1056 Filed Weights   06/25/19 2305  Weight: 111.1 kg   Body mass index is 33.23 kg/m.  GENERAL:  Well appearing HEENT:   Pupils equal round and reactive, fundi not visualized, oral mucosa unremarkable NECK:  No  jugular venous distention, waveform within normal limits, carotid upstroke brisk and symmetric, no bruits, no thyromegaly LYMPHATICS:  No cervical, inguinal adenopathy LUNGS:  Few basilar crackles.  BACK:  No CVA tenderness CHEST:   Unremarkable HEART:  PMI not displaced or sustained,S1 and S2 within normal limits, no S3, no S4, no clicks, no rubs, no murmurs ABD:  Flat, positive bowel sounds normal in frequency in pitch, no bruits, no rebound, no guarding, no midline pulsatile mass, no hepatomegaly, no splenomegaly EXT:  2 plus pulses throughout, no  edema, no cyanosis no clubbing SKIN:  No rashes no nodules NEURO:   Cranial nerves II through XII grossly intact, motor grossly intact throughout PSYCH:    Cognitively intact, oriented to person place and time   EKG:  The EKG was personally reviewed and demonstrates:  NSR, rate 96, QTc prolonged.  No acute ST T wave changes  Telemetry:  Telemetry was personally reviewed and demonstrates:  NA  Relevant CV Studies: Echo pending  Laboratory Data:  Chemistry Recent Labs  Lab 06/25/19 2310  NA 138  K 3.6  CL 105  CO2 22  GLUCOSE  237*  BUN 19  CREATININE 1.06  CALCIUM 8.9  GFRNONAA >60  GFRAA >60  ANIONGAP 11    Recent Labs  Lab 06/25/19 2310  PROT 6.8  ALBUMIN 3.8  AST 22  ALT 41  ALKPHOS 73  BILITOT 1.8*   Hematology Recent Labs  Lab 06/25/19 2310 06/26/19 0624  WBC 8.3 8.4  RBC 5.54 5.33  HGB 15.4 15.0  HCT 48.1 45.9  MCV 86.8 86.1  MCH 27.8 28.1  MCHC 32.0 32.7  RDW 13.8 13.9  PLT 212 218   Cardiac EnzymesNo results for input(s): TROPONINI in the last 168 hours. No results for input(s): TROPIPOC in the last 168 hours.  BNP Recent Labs  Lab 06/25/19 2310  BNP 948.0*    DDimer No results for input(s): DDIMER in the last 168 hours.  Radiology/Studies:  Dg Chest 2 View  Result Date: 06/25/2019 CLINICAL DATA:  Initial evaluation for acute chest pain. EXAM: CHEST - 2 VIEW COMPARISON:  Prior radiograph from 11/19/2018. FINDINGS: Cardiomegaly, stable.  Mediastinal silhouette within normal limits. Lungs normally inflated. Diffuse pulmonary vascular congestion with interstitial prominence, compatible with diffuse pulmonary interstitial edema. Superimposed streaky bibasilar atelectasis. No consolidative opacity. No pleural effusion. No pneumothorax. No acute osseous finding. IMPRESSION: 1. Cardiomegaly with mild diffuse pulmonary interstitial edema. 2. No other active cardiopulmonary disease. Electronically Signed   By: Jeannine Boga M.D.   On: 06/25/2019 23:42    Assessment and Plan:   ACUTE ON CHRONIC SYSTOLIC AND DIASTOLIC HF:   Patient seems to have some mild acute on chronic systolic and diastolic heart failure.  He can be treated with IV Lasix and was given 40 mg twice daily.  I think he needs probably another day of diuresis but doubt that he will need further significant change in his meds..  He seems to otherwise be on OMT and I would continue current meds.  He needs better salt restriction at home and we talked briefly about this.  He is to weigh himself daily.  CKD:  This was an  issue previously but his creat is normal and he seems to tolerate Entresto.  Should tolerate IV diuresis.      HTN: Blood pressure is elevated but he is on reasonable meds.  I think this can be controlled with therapies as listed.  CHEST PAIN: Chest pain is nonanginal.  No further ischemia work-up is suggested.  TOBACCO ABUSE:    Educated and being given the nicotine patch.      For questions or updates, please contact [place] Please consult [email or web address] for contact info under Cardiology/STEMI.   Signed, Minus Breeding, MD  06/26/2019 10:56 AM

## 2019-06-27 LAB — GLUCOSE, CAPILLARY
Glucose-Capillary: 122 mg/dL — ABNORMAL HIGH (ref 70–99)
Glucose-Capillary: 132 mg/dL — ABNORMAL HIGH (ref 70–99)
Glucose-Capillary: 135 mg/dL — ABNORMAL HIGH (ref 70–99)
Glucose-Capillary: 210 mg/dL — ABNORMAL HIGH (ref 70–99)

## 2019-06-27 LAB — CBC WITH DIFFERENTIAL/PLATELET
Abs Immature Granulocytes: 0.08 10*3/uL — ABNORMAL HIGH (ref 0.00–0.07)
Basophils Absolute: 0.1 10*3/uL (ref 0.0–0.1)
Basophils Relative: 1 %
Eosinophils Absolute: 0.2 10*3/uL (ref 0.0–0.5)
Eosinophils Relative: 2 %
HCT: 50.2 % (ref 39.0–52.0)
Hemoglobin: 16.4 g/dL (ref 13.0–17.0)
Immature Granulocytes: 1 %
Lymphocytes Relative: 28 %
Lymphs Abs: 2.6 10*3/uL (ref 0.7–4.0)
MCH: 27.8 pg (ref 26.0–34.0)
MCHC: 32.7 g/dL (ref 30.0–36.0)
MCV: 85.1 fL (ref 80.0–100.0)
Monocytes Absolute: 0.6 10*3/uL (ref 0.1–1.0)
Monocytes Relative: 7 %
Neutro Abs: 5.6 10*3/uL (ref 1.7–7.7)
Neutrophils Relative %: 61 %
Platelets: 252 10*3/uL (ref 150–400)
RBC: 5.9 MIL/uL — ABNORMAL HIGH (ref 4.22–5.81)
RDW: 13.8 % (ref 11.5–15.5)
WBC: 9.2 10*3/uL (ref 4.0–10.5)
nRBC: 0 % (ref 0.0–0.2)

## 2019-06-27 LAB — BASIC METABOLIC PANEL
Anion gap: 8 (ref 5–15)
BUN: 24 mg/dL — ABNORMAL HIGH (ref 6–20)
CO2: 31 mmol/L (ref 22–32)
Calcium: 9.1 mg/dL (ref 8.9–10.3)
Chloride: 103 mmol/L (ref 98–111)
Creatinine, Ser: 1.09 mg/dL (ref 0.61–1.24)
GFR calc Af Amer: >60 mL/min (ref >60)
GFR calc non Af Amer: >60 mL/min (ref >60)
Glucose, Bld: 137 mg/dL — ABNORMAL HIGH (ref 70–99)
Potassium: 4.1 mmol/L (ref 3.5–5.1)
Sodium: 142 mmol/L (ref 135–145)

## 2019-06-27 LAB — MAGNESIUM: Magnesium: 2.1 mg/dL (ref 1.7–2.4)

## 2019-06-27 LAB — TROPONIN I (HIGH SENSITIVITY): Troponin I (High Sensitivity): 38 ng/L — ABNORMAL HIGH (ref <18)

## 2019-06-27 MED ORDER — NITROGLYCERIN 0.4 MG SL SUBL: 0.4000 mg | SUBLINGUAL_TABLET | SUBLINGUAL | 0 refills | Status: DC | PRN

## 2019-06-27 MED ORDER — SACUBITRIL-VALSARTAN 97-103 MG PO TABS: 1.0000 | ORAL_TABLET | Freq: Two times a day (BID) | ORAL | 1 refills | Status: DC

## 2019-06-27 MED ORDER — NICOTINE 21 MG/24HR TD PT24: 21.0000 mg | MEDICATED_PATCH | Freq: Every day | TRANSDERMAL | 0 refills | Status: DC

## 2019-06-27 NOTE — Progress Notes (Signed)
Pt had an 11-beat run of VT.  Expereienced SOB with fluttering in chest.  S&S have resolved.  Will complete at 12-lead EKG. MD informed.

## 2019-06-27 NOTE — Discharge Summary (Addendum)
Physician Discharge Summary  Gabriel [name]. ST:[id number] DOB: 07-05-1971 DOA: 06/25/2019  PCP: Alisa Graff, FNP  Admit date: 06/25/2019 Discharge date: 06/27/2019  Recommendations for Outpatient Follow-up:  1. Follow up with PCP in 5 days 2. Please obtain BMP and magnesium level at follow vist 3. Please follow up on the following pending results:  Home Health: none Equipment/Devices: none    Discharge Condition: stable CODE STATUS: Full Diet recommendation: low salt diet  Brief/Interim Summary (HPI)  Gabriel [name] a48 y.o.obese Caucasianmalewith a known history of chronic systolic and diastolic CHF, type 2 diabetes mellitus, hypertension, COPD and stage III chronic kidney disease as well as coronary artery disease who presented to the emergency room with acute onset ofdyspnea with associated orthopnea and paroxysmal nocturnal dyspnea over the last couple of days with no worsening lower extremity edema. He has been having associated chest pain felt chest tightness and occasionally sharp with cough in the midsternal area with no radiation. He graded 8/10 in severity with no nausea vomiting or diaphoresis. He denied any leg pain or recent travels or surgeries. No headache or dizziness or blurred vision. No paresthesias or focal muscle weakness. He has been having urinary frequency without dysuria oliguria or hematuria or flank pain.  Upon presentation to the emergency room,Blood pressure was 187/104 with respiratory to 22with a pulse 97% on room air and temperature of 90.1. Labs revealed potassium of 3.6 and blood glucose of 237 and BNP of 948 with high-sensitivity troponin I of 62. Portable chest x-ray showed cardiomegaly with mild diffuse pulmonary interstitial edema. EKG showed normal sinus rhythm with rate of 96 with suspected left atrial volume, incomplete left bundle branch block and T wave inversion inferiorly.COVID-19 test is currently pending.  The patient  was given 4 baby aspirin, 4 mg of IV morphine sulfate and 4 mg IV Zofran as well as 1 mg of IV Bumex. He will be admitted to telemetry bed for further evaluation and management.   Discharge Diagnoses and Hospital Course:   Principal Problem:   Acute on chronic combined systolic and diastolic CHF (congestive heart failure) (HCC) Active Problems:   HTN (hypertension)   Elevated troponin   Tobacco use   Chest pain   Acute CHF (congestive heart failure) (HCC)   CKD (chronic kidney disease), stage IIIa   Type II diabetes mellitus with renal manifestations (HCC)   Hypertensive urgency  Acute on chronic combined systolic and diastolic CHF (congestive heart failure) ([facility]): 2D echo on 11/7 showed 30 to 35%, which is worse than previous 45-45 %. Dr. Percival Spanish of card consulted --> "He can be treated with IV Lasix and was given 40 mg twice daily". Pt was treated with IV Bumex, and then IV lasix 40 mg bid in hospital.  Symptoms improved significantly.  Patient feels like he is back to his baseline.  -Will continue homed dose of torsemid -pt need to check BW daily, and needs to take extra dose of torsemide if BW increases by >2 Lbs -Continue Entresto, Toprol-XL and Aldactone  Hypertensive urgency.Bp is 136/94 this AM. -will be on clonidine, metoprolol, Torsemid and Entresto  Chest pain with elevated troponin I: trop 62 -->58-->65. Likely demand ischemia. No further work-up needed per Dr. Percival Spanish  Dyslipidemia. -We will continue statin therapy.  Type II diabetes mellitus with renal manifestations.  -continue home meds  Tobacco use: -nicotine patch   CKD (chronic kidney disease), stage IIIa: stable. -f/u by [facility]      Discharge Instructions  Discharge Instructions  Call MD for:  difficulty breathing, headache or visual disturbances   Complete by: As directed    Call MD for:  severe uncontrolled pain   Complete by: As directed    Call MD for:  temperature >100.4    Complete by: As directed    Diet - low sodium heart healthy   Complete by: As directed    Increase activity slowly   Complete by: As directed      Allergies as of 06/27/2019      Reactions   Prednisone Other (See Comments)   Reaction: Hallucinations    Zantac [ranitidine Hcl] Diarrhea, Nausea Only   Night sweats      Medication List    TAKE these medications   aspirin 81 MG chewable tablet Chew 1 tablet (81 mg total) by mouth daily. Reported on 02/08/2016   cloNIDine 0.1 MG tablet Commonly known as: Catapres Take 1 tablet (0.1 mg total) by mouth 2 (two) times daily.   insulin detemir 100 UNIT/ML injection Commonly known as: Levemir Inject 0.24 mLs (24 Units total) into the skin at bedtime.   metFORMIN 500 MG tablet Commonly known as: GLUCOPHAGE Take 1 tablet (500mg ) with breakfast and 2 tablets (500mg ) at dinner. What changed:   how much to take  how to take this  when to take this  additional instructions   metoprolol 200 MG 24 hr tablet Commonly known as: Toprol XL Take 1 tablet (200 mg total) by mouth daily.   nicotine 21 mg/24hr patch Commonly known as: NICODERM CQ - dosed in mg/24 hours Place 1 patch (21 mg total) onto the skin daily. Start taking on: June 28, 2019   nitroGLYCERIN 0.4 MG SL tablet Commonly known as: NITROSTAT Place 1 tablet (0.4 mg total) under the tongue every 5 (five) minutes x 3 doses as needed for chest pain.   pantoprazole 20 MG tablet Commonly known as: Protonix Take 1 tablet (20 mg total) by mouth at bedtime.   rosuvastatin 20 MG tablet Commonly known as: CRESTOR Take 1 tablet (20 mg total) by mouth daily.   sacubitril-valsartan 97-103 MG Commonly known as: ENTRESTO Take 1 tablet by mouth 2 (two) times daily.   spironolactone 25 MG tablet Commonly known as: ALDACTONE TAKE ONE TABLET BY MOUTH 2 TIMES A DAY   torsemide 20 MG tablet Commonly known as: DEMADEX Take 2 tablets (40 mg total) by mouth daily. What  changed:   how much to take  when to take this      [facility] Follow up on 07/09/2019.   Specialty: Cardiology Why: at 9:00am. Please enter through the [place] entrance Contact information: [name] [address], [place], [place] Follow up in 5 day(s).   Specialty: Family Medicine Contact information: [place] [address] [phone]        Minna Merritts, MD .   Specialty: Cardiology Contact information: [address] [place] [address] [phone]          Allergies  Allergen Reactions  . Prednisone Other (See Comments)    Reaction: Hallucinations    . Zantac [Ranitidine Hcl] Diarrhea and Nausea Only    Night sweats    Consultations:  car   Procedures/Studies: Dg Chest 2 View  Result Date: 06/25/2019 CLINICAL DATA:  Initial evaluation for acute chest  pain. EXAM: CHEST - 2 VIEW COMPARISON:  Prior radiograph from 11/19/2018. FINDINGS: Cardiomegaly, stable.  Mediastinal silhouette within normal limits. Lungs normally inflated. Diffuse pulmonary vascular congestion with interstitial prominence, compatible with diffuse pulmonary interstitial edema. Superimposed streaky bibasilar atelectasis. No consolidative opacity. No pleural effusion. No pneumothorax. No acute osseous finding. IMPRESSION: 1. Cardiomegaly with mild diffuse pulmonary interstitial edema. 2. No other active cardiopulmonary disease. Electronically Signed   By: Jeannine Boga M.D.   On: 06/25/2019 23:42     Subjective: Feel much better, still has mild SOB and chest discomfort, but stating that he feels he is back to his baseline. No GI symptoms.  Discharge Exam: Vitals:   06/27/19 0408 06/27/19 0807  BP: 110/84 109/83  Pulse: (!) 58 61  Resp: 14 16  Temp: 98.3 F (36.8 C) 98.4 F (36.9  C)  SpO2: 96% 93%   Vitals:   06/26/19 1930 06/26/19 2200 06/27/19 0408 06/27/19 0807  BP: 111/72 131/86 110/84 109/83  Pulse: 62 64 (!) 58 61  Resp: 16 20 14 16   Temp: 97.6 F (36.4 C)  98.3 F (36.8 C) 98.4 F (36.9 C)  TempSrc: Oral  Oral Oral  SpO2: 95% 96% 96% 93%  Weight:   108 kg   Height:        General: Pt is alert, awake, not in acute distress Cardiovascular: RRR, S1/S2 +, no rubs, no gallops Respiratory: CTA bilaterally, no wheezing, no rhonchi Abdominal: Soft, NT, ND, bowel sounds + Extremities: has trace leg edema, no cyanosis    The results of significant diagnostics from this hospitalization (including imaging, microbiology, ancillary and laboratory) are listed below for reference.     Microbiology: Recent Results (from the past 240 hour(s))  SARS CORONAVIRUS 2 (TAT 6-24 HRS) Nasopharyngeal Nasopharyngeal Swab     Status: None   Collection Time: 06/26/19  2:00 AM   Specimen: Nasopharyngeal Swab  Result Value Ref Range Status   SARS Coronavirus 2 NEGATIVE NEGATIVE Final    Comment: (NOTE) SARS-CoV-2 target nucleic acids are NOT DETECTED. The SARS-CoV-2 RNA is generally detectable in upper and lower respiratory specimens during the acute phase of infection. Negative results do not preclude SARS-CoV-2 infection, do not rule out co-infections with other pathogens, and should not be used as the sole basis for treatment or other patient management decisions. Negative results must be combined with clinical observations, patient history, and epidemiological information. The expected result is Negative. Fact Sheet for Patients: SugarRoll.be Fact Sheet for Healthcare Providers: https://www.woods-mathews.com/ This test is not yet approved or cleared by the [place] FDA and  has been authorized for detection and/or diagnosis of SARS-CoV-2 by FDA under an Emergency Use Authorization (EUA). This EUA will remain  in  effect (meaning this test can be used) for the duration of the COVID-19 declaration under Section 56 4(b)(1) of the Act, 21 U.S.C. section 360bbb-3(b)(1), unless the authorization is terminated or revoked sooner. Performed at [facility], [place] [address], [place], [place] [address]      Labs: BNP (last 3 results) Recent Labs    06/25/19 2310  BNP [id number]   Basic Metabolic Panel: Recent Labs  Lab 06/25/19 2310 06/26/19 0202 06/27/19 0444  NA 138  --  142  K 3.6  --  4.1  CL 105  --  103  CO2 22  --  31  GLUCOSE 237*  --  137*  BUN 19  --  24*  CREATININE 1.06  --  1.09  CALCIUM  8.9  --  9.1  MG  --  1.9 2.1   Liver Function Tests: Recent Labs  Lab 06/25/19 2310  AST 22  ALT 41  ALKPHOS 73  BILITOT 1.8*  PROT 6.8  ALBUMIN 3.8   No results for input(s): LIPASE, AMYLASE in the last 168 hours. No results for input(s): AMMONIA in the last 168 hours. CBC: Recent Labs  Lab 06/25/19 2310 06/26/19 0624 06/27/19 0444  WBC 8.3 8.4 9.2  NEUTROABS  --  4.9 5.6  HGB 15.4 15.0 16.4  HCT 48.1 45.9 50.2  MCV 86.8 86.1 85.1  PLT 212 218 252   Cardiac Enzymes: No results for input(s): CKTOTAL, CKMB, CKMBINDEX, TROPONINI in the last 168 hours. BNP: Invalid input(s): POCBNP CBG: Recent Labs  Lab 06/26/19 2053 06/27/19 0009 06/27/19 0407 06/27/19 0817 06/27/19 1141  GLUCAP 167* 210* 122* 132* 135*   D-Dimer No results for input(s): DDIMER in the last 72 hours. Hgb A1c No results for input(s): HGBA1C in the last 72 hours. Lipid Profile No results for input(s): CHOL, HDL, LDLCALC, TRIG, CHOLHDL, LDLDIRECT in the last 72 hours. Thyroid function studies No results for input(s): TSH, T4TOTAL, T3FREE, THYROIDAB in the last 72 hours.  Invalid input(s): FREET3 Anemia work up No results for input(s): VITAMINB12, FOLATE, FERRITIN, TIBC, IRON, RETICCTPCT in the last 72 hours. Urinalysis    Component Value Date/Time   COLORURINE STRAW (A) 01/09/2018 1659    APPEARANCEUR Clear 07/29/2018 1200   LABSPEC 1.026 01/09/2018 1659   LABSPEC 1.019 11/12/2012 1713   PHURINE 6.0 01/09/2018 1659   GLUCOSEU Negative 07/29/2018 1200   GLUCOSEU Negative 11/12/2012 1713   HGBUR SMALL (A) 01/09/2018 1659   BILIRUBINUR Negative 07/29/2018 1200   BILIRUBINUR Negative 11/12/2012 1713   KETONESUR 5 (A) 01/09/2018 1659   PROTEINUR 2+ (A) 07/29/2018 1200   PROTEINUR 100 (A) 01/09/2018 1659   NITRITE Negative 07/29/2018 1200   NITRITE NEGATIVE 01/09/2018 1659   LEUKOCYTESUR Negative 07/29/2018 1200   LEUKOCYTESUR Negative 11/12/2012 1713   Sepsis Labs Invalid input(s): PROCALCITONIN,  WBC,  LACTICIDVEN Microbiology Recent Results (from the past 240 hour(s))  SARS CORONAVIRUS 2 (TAT 6-24 HRS) Nasopharyngeal Nasopharyngeal Swab     Status: None   Collection Time: 06/26/19  2:00 AM   Specimen: Nasopharyngeal Swab  Result Value Ref Range Status   SARS Coronavirus 2 NEGATIVE NEGATIVE Final    Comment: (NOTE) SARS-CoV-2 target nucleic acids are NOT DETECTED. The SARS-CoV-2 RNA is generally detectable in upper and lower respiratory specimens during the acute phase of infection. Negative results do not preclude SARS-CoV-2 infection, do not rule out co-infections with other pathogens, and should not be used as the sole basis for treatment or other patient management decisions. Negative results must be combined with clinical observations, patient history, and epidemiological information. The expected result is Negative. Fact Sheet for Patients: SugarRoll.be Fact Sheet for Healthcare Providers: https://www.woods-mathews.com/ This test is not yet approved or cleared by the [place] FDA and  has been authorized for detection and/or diagnosis of SARS-CoV-2 by FDA under an Emergency Use Authorization (EUA). This EUA will remain  in effect (meaning this test can be used) for the duration of the COVID-19 declaration under  Section 56 4(b)(1) of the Act, 21 U.S.C. section 360bbb-3(b)(1), unless the authorization is terminated or revoked sooner. Performed at [facility], [place] [address], [place], [place] [address]     Time coordinating discharge:  35 minutes  SIGNED:  [name], DO [facility]  06/27/2019, 12:30 PM Pager is on AMION  If 7PM-7AM, please contact night-coverage [email or web address] Password [email or web address]

## 2019-06-27 NOTE — Discharge Instructions (Signed)
You were cared for by a hospitalist during your hospital stay. If you have any questions about your discharge medications or the care you received while you were in the hospital after you are discharged, you can call the unit and ask to speak with the hospitalist on call if the hospitalist that took care of you is not available. Once you are discharged, your primary care physician will handle any further medical issues. Please note that NO REFILLS for any discharge medications will be authorized once you are discharged, as it is imperative that you return to your primary care physician (or establish a relationship with a primary care physician if you do not have one) for your aftercare needs so that they can reassess your need for medications and monitor your lab values.  Follow up with PCP and cardiologist Take all medications as prescribed. If symptoms change or worsen please return to the ED for evaluation Please measure your body weight every day, if your body weight increases by more than 2 pounds, please take extra dose of torsemide.    Heart Failure, Diagnosis  Heart failure means that your heart is not able to pump blood in the right way. This makes it hard for your body to work well. Heart failure is usually a long-term (chronic) condition. You must take good care of yourself and follow your treatment plan from your doctor. What are the causes? This condition may be caused by:  High blood pressure.  Build up of cholesterol and fat in the arteries.  Heart attack. This injures the heart muscle.  Heart valves that do not open and close properly.  Damage of the heart muscle. This is also called cardiomyopathy.  Lung disease.  Abnormal heart rhythms. What increases the risk? The risk of heart failure goes up as a person ages. This condition is also more likely to develop in people who:  Are overweight.  Are male.  Smoke or chew tobacco.  Abuse alcohol or illegal drugs.  Have  taken medicines that can damage the heart.  Have diabetes.  Have abnormal heart rhythms.  Have thyroid problems.  Have low blood counts (anemia). What are the signs or symptoms? Symptoms of this condition include:  Shortness of breath.  Coughing.  Swelling of the feet, ankles, legs, or belly.  Losing weight for no reason.  Trouble breathing.  Waking from sleep because of the need to sit up and get more air.  Rapid heartbeat.  Being very tired.  Feeling dizzy, or feeling like you may pass out (faint).  Having no desire to eat.  Feeling like you may vomit (nauseous).  Peeing (urinating) more at night.  Feeling confused. How is this treated?     This condition may be treated with:  Medicines. These can be given to treat blood pressure and to make the heart muscles stronger.  Changes in your daily life. These may include eating a healthy diet, staying at a healthy body weight, quitting tobacco and illegal drug use, or doing exercises.  Surgery. Surgery can be done to open blocked valves, or to put devices in the heart, such as pacemakers.  A donor heart (heart transplant). You will receive a healthy heart from a donor. Follow these instructions at home:  Treat other conditions as told by your doctor. These may include high blood pressure, diabetes, thyroid disease, or abnormal heart rhythms.  Learn as much as you can about heart failure.  Get support as you need it.  Keep all  follow-up visits as told by your doctor. This is important. Summary  Heart failure means that your heart is not able to pump blood in the right way.  This condition is caused by high blood pressure, heart attack, or damage of the heart muscle.  Symptoms of this condition include shortness of breath and swelling of the feet, ankles, legs, or belly. You may also feel very tired or feel like you may vomit.  You may be treated with medicines, surgery, or changes in your daily  life.  Treat other health conditions as told by your doctor. This information is not intended to replace advice given to you by your health care provider. Make sure you discuss any questions you have with your health care provider. Document Released: 05/14/2008 Document Revised: 10/23/2018 Document Reviewed: 10/23/2018 Elsevier Patient Education  [place].

## 2019-06-27 NOTE — Plan of Care (Signed)
Discharge instructions provided to pt.  All questions addressed.  Understanding verified through teach back.  Awaiting transportation home via POV.   Problem: Education: Goal: Knowledge of General Education information will improve Description: Including pain rating scale, medication(s)/side effects and non-pharmacologic comfort measures Outcome: Completed/Met   Problem: Health Behavior/Discharge Planning: Goal: Ability to manage health-related needs will improve Outcome: Completed/Met   Problem: Clinical Measurements: Goal: Ability to maintain clinical measurements within normal limits will improve Outcome: Completed/Met Goal: Will remain free from infection Outcome: Completed/Met Goal: Diagnostic test results will improve Outcome: Completed/Met Goal: Respiratory complications will improve Outcome: Completed/Met Goal: Cardiovascular complication will be avoided Outcome: Completed/Met   Problem: Activity: Goal: Risk for activity intolerance will decrease Outcome: Completed/Met   Problem: Nutrition: Goal: Adequate nutrition will be maintained Outcome: Completed/Met   Problem: Coping: Goal: Level of anxiety will decrease Outcome: Completed/Met   Problem: Elimination: Goal: Will not experience complications related to bowel motility Outcome: Completed/Met Goal: Will not experience complications related to urinary retention Outcome: Completed/Met   Problem: Pain Managment: Goal: General experience of comfort will improve Outcome: Completed/Met   Problem: Safety: Goal: Ability to remain free from injury will improve Outcome: Completed/Met   Problem: Skin Integrity: Goal: Risk for impaired skin integrity will decrease Outcome: Completed/Met   

## 2019-06-27 NOTE — Progress Notes (Addendum)
Progress Note  Patient Name: Gabriel [name]. Date of Encounter: 06/27/2019  Primary Cardiologist:   [name], MD   Subjective   No new chest pain.  Breathing is improved and he thinks back to baseline.  NSVT last night.   Some palpitations.    Inpatient Medications    Scheduled Meds: . aspirin  81 mg Oral Daily  . cloNIDine  0.1 mg Oral BID  . enoxaparin (LOVENOX) injection  40 mg Subcutaneous Q24H  . furosemide  40 mg Intravenous Q12H  . influenza vac split quadrivalent PF  0.5 mL Intramuscular Tomorrow-1000  . insulin aspart  0-9 Units Subcutaneous Q4H  . insulin detemir  16 Units Subcutaneous QHS  . metoprolol  100 mg Oral BID  . nicotine  21 mg Transdermal Daily  . pantoprazole  20 mg Oral QHS  . rosuvastatin  20 mg Oral Daily  . sacubitril-valsartan  1 tablet Oral BID  . sodium chloride flush  3 mL Intravenous Q12H  . spironolactone  25 mg Oral BID   Continuous Infusions: . sodium chloride     PRN Meds: sodium chloride, acetaminophen, HYDROcodone-acetaminophen, morphine injection, nitroGLYCERIN, ondansetron (ZOFRAN) IV, sodium chloride flush, zolpidem   Vital Signs    Vitals:   06/26/19 1930 06/26/19 2200 06/27/19 0408 06/27/19 0807  BP: 111/72 131/86 110/84 109/83  Pulse: 62 64 (!) 58 61  Resp: 16 20 14 16   Temp: 97.6 F (36.4 C)  98.3 F (36.8 C) 98.4 F (36.9 C)  TempSrc: Oral  Oral Oral  SpO2: 95% 96% 96% 93%  Weight:   108 kg   Height:        Intake/Output Summary (Last 24 hours) at 06/27/2019 0810 Last data filed at 06/27/2019 0800 Gross per 24 hour  Intake -  Output 3100 ml  Net -3100 ml   Filed Weights   06/25/19 2305 06/27/19 0408  Weight: 111.1 kg 108 kg    Telemetry    NSR, NSVT longest run 9 beats.   - Personally Reviewed  ECG    Unusual P wave axis, limb lead reversal.  Inferior T wave inversion not changed from EKG earlier today.  - Personally Reviewed  Physical Exam   GEN: No acute distress.   Neck: No  JVD  Cardiac: RRR, no murmurs, rubs, or gallops.  Respiratory: Clear to auscultation bilaterally. GI: Soft, nontender, non-distended  MS: No edema; No deformity. Neuro:  Nonfocal  Psych: Normal affect   Labs    Chemistry Recent Labs  Lab 06/25/19 2310 06/27/19 0444  NA 138 142  K 3.6 4.1  CL 105 103  CO2 22 31  GLUCOSE 237* 137*  BUN 19 24*  CREATININE 1.06 1.09  CALCIUM 8.9 9.1  PROT 6.8  --   ALBUMIN 3.8  --   AST 22  --   ALT 41  --   ALKPHOS 73  --   BILITOT 1.8*  --   GFRNONAA >60 >60  GFRAA >60 >60  ANIONGAP 11 8     Hematology Recent Labs  Lab 06/25/19 2310 06/26/19 0624 06/27/19 0444  WBC 8.3 8.4 9.2  RBC 5.54 5.33 5.90*  HGB 15.4 15.0 16.4  HCT 48.1 45.9 50.2  MCV 86.8 86.1 85.1  MCH 27.8 28.1 27.8  MCHC 32.0 32.7 32.7  RDW 13.8 13.9 13.8  PLT 212 218 252    Cardiac EnzymesNo results for input(s): TROPONINI in the last 168 hours. No results for input(s): TROPIPOC in the  last 168 hours.   BNP Recent Labs  Lab 06/25/19 2310  BNP 948.0*     DDimer No results for input(s): DDIMER in the last 168 hours.   Radiology    Dg Chest 2 View  Result Date: 06/25/2019 CLINICAL DATA:  Initial evaluation for acute chest pain. EXAM: CHEST - 2 VIEW COMPARISON:  Prior radiograph from 11/19/2018. FINDINGS: Cardiomegaly, stable.  Mediastinal silhouette within normal limits. Lungs normally inflated. Diffuse pulmonary vascular congestion with interstitial prominence, compatible with diffuse pulmonary interstitial edema. Superimposed streaky bibasilar atelectasis. No consolidative opacity. No pleural effusion. No pneumothorax. No acute osseous finding. IMPRESSION: 1. Cardiomegaly with mild diffuse pulmonary interstitial edema. 2. No other active cardiopulmonary disease. Electronically Signed   By: Jeannine Boga M.D.   On: 06/25/2019 23:42    Cardiac Studies   ECHO:   1. Left ventricular ejection fraction, by visual estimation, is 30 to 35%. The left  ventricle has moderately decreased function. There is no left ventricular hypertrophy.  2. Definity contrast agent was given IV to delineate the left ventricular endocardial borders.  3. The left ventricle demonstrates global hypokinesis.  4. Global right ventricle has normal systolic function.The right ventricular size is mildly enlarged. No increase in right ventricular wall thickness.  5. Left atrial size was moderately dilated.  6. Moderately elevated pulmonary artery systolic pressure.  7. The tricuspid regurgitant velocity is 3.10 m/s, and with an assumed right atrial pressure of 20 mmHg, the estimated right ventricular systolic pressure is moderately elevated at 58.4 mmHg.  8. Moderately dilated left ventricular internal cavity size.   Patient Profile     48 y.o. male with a hx of non ischemic cardiomyopathy who is being seen for the evaluation of dyspnea at the request of Dr. Blaine Hamper.  Assessment & Plan    NSVT:     Noted.    Magnesium NL.  Will need out patient EP referral to discuss a possible ICD as he is on OMT and continues to have an EF of 30%.    ACUTE ON CHRONIC SYSTOLIC HF:    Net negative 3.1 liters since admission. He says he is back to baseline.  I think that he could be discharged if the primary team agrees.   I would send him home on the same dose of PO diuretic but with very strict salt, fluid restriction and daily weights.  Needs to take an extra 20 Torsemide if he gains 2 - 3 lbs in a day.  We will arrange TOC follow up within 7 days.   HTN:   BP is controlled and will not tolerate med titration.     CKD:    Creat has been normal and tolerating diuresis.      CHEST PAIN:    Atypical.  No objective evidence of ischemia.     PROLONGED QTC:  This has been present on previous EKGs.  No QT prolonging meds.  Needs to avoid these going forward.   Again, needs EP evaluation.    For questions or updates, please contact [place] Please consult [email or web address] for contact  info under Cardiology/STEMI.   Signed, Minus Breeding, MD  06/27/2019, 8:10 AM

## 2019-06-28 ENCOUNTER — Telehealth: Payer: Self-pay | Admitting: Pharmacist

## 2019-06-28 ENCOUNTER — Telehealth: Payer: Self-pay | Admitting: *Deleted

## 2019-06-28 NOTE — Telephone Encounter (Signed)
-----   Message from Emily Filbert, RN sent at 06/28/2019  9:49 AM EST ----- Regarding: FW: Needs  ----- Message ----- From: Sindy Messing Sent: 06/28/2019   7:47 AM EST To: [facility] Triage Subject: FW: Needs                                       ----- Message ----- From: Minus Breeding, MD Sent: 06/27/2019  12:02 PM EST To: Rise Mu, PA-C, Minna Merritts, MD Subject: Needs                                          Hi,  I wonder if we can get this guy a TOC follow up for HF within 7 days or in HF clinic.  Also, he had a non ischemic cardiomyopathy with an EF of 30% on OMT, QT I prolonged and he has NSVT.  I think he needs EP consult soon to consider ICD.

## 2019-06-28 NOTE — Telephone Encounter (Signed)
Scheduled 11/13 with Sharolyn Douglas for fu .   Patient declined to schedule right now for EP consult .  Will talk to [place] on Friday and wants to consider this for when insurance can cover ICD

## 2019-06-28 NOTE — Telephone Encounter (Signed)
Patient contacted regarding discharge from [facility] on 06/27/19.  Patient understands to follow up with provider Sharolyn Douglas on 07/02/19 at 1000 at [facility]. Patient understands discharge instructions? yes Patient understands medications and regiment? yes Patient understands to bring all medications to this visit? yes

## 2019-06-28 NOTE — Telephone Encounter (Signed)
06/28/2019 11:16:52 AM - [name] refill called to [facility]  06/28/2019 I called [employer] for refill on Entresto 97/103, allow 2-3 business days to receive at our address.Gabriel [name]

## 2019-06-28 NOTE — Telephone Encounter (Signed)
Staff message forwarded to scheduling to arrange follow up appointments.

## 2019-06-30 ENCOUNTER — Telehealth: Payer: Self-pay | Admitting: Pharmacist

## 2019-06-30 ENCOUNTER — Other Ambulatory Visit: Payer: Self-pay

## 2019-06-30 ENCOUNTER — Telehealth: Payer: Self-pay | Admitting: *Deleted

## 2019-06-30 NOTE — Telephone Encounter (Signed)
-----   Message from Minna Merritts, MD sent at 06/29/2019  8:13 PM EST ----- Regarding: TCM ASAP Needs TCM call ASAP Needs close follow-up in clinic preferably within a week Also needs appointment with EP Thx TG   ----- Message ----- From: Minus Breeding, MD Sent: 06/27/2019  12:02 PM EST To: Rise Mu, PA-C, Minna Merritts, MD Subject: Needs                                          Hi,  I wonder if we can get this guy a TOC follow up for HF within 7 days or in HF clinic.  Also, he had a non ischemic cardiomyopathy with an EF of 30% on OMT, QT I prolonged and he has NSVT.  I think he needs EP consult soon to consider ICD.

## 2019-06-30 NOTE — Telephone Encounter (Signed)
Patient has appointment with Ignacia Bayley, NP on 07/02/19. TCM call already done. Routing to Ignacia Bayley to make him aware and make sure ok to wait for appointment to set up EP as patient previously refused because wanted to come to f/u appointment first.

## 2019-06-30 NOTE — Telephone Encounter (Signed)
06/30/2019 10:08:32 AM - [name] 2nd request to pt  06/30/2019 I am working reports--I previously 05/11/2019 mailed [name] renewal forms to patient to sign & return--HE HAS NOT RETURNED--patient has meds here on wall to pick up--I have put forms and 2nd request letter in back--patient just needs to sign, date, and initial a block on Medicaid elig. form, the provider signed their portion 05/19/2019.Gabriel [name]

## 2019-06-30 NOTE — Telephone Encounter (Signed)
No problem. We can discuss reasoning for EP referral and make referral if he's interested, when I see him.

## 2019-07-02 ENCOUNTER — Encounter: Payer: Self-pay | Admitting: Nurse Practitioner

## 2019-07-02 ENCOUNTER — Telehealth: Payer: Self-pay | Admitting: Pharmacist

## 2019-07-02 ENCOUNTER — Other Ambulatory Visit: Payer: Self-pay

## 2019-07-02 ENCOUNTER — Ambulatory Visit (INDEPENDENT_AMBULATORY_CARE_PROVIDER_SITE_OTHER): Payer: Self-pay | Admitting: Nurse Practitioner

## 2019-07-02 VITALS — BP 130/90 | HR 63 | Temp 97.1°F | Ht 72.0 in | Wt 241.5 lb

## 2019-07-02 DIAGNOSIS — I5023 Acute on chronic systolic (congestive) heart failure: Secondary | ICD-10-CM

## 2019-07-02 DIAGNOSIS — I428 Other cardiomyopathies: Secondary | ICD-10-CM

## 2019-07-02 DIAGNOSIS — I10 Essential (primary) hypertension: Secondary | ICD-10-CM

## 2019-07-02 DIAGNOSIS — R9431 Abnormal electrocardiogram [ECG] [EKG]: Secondary | ICD-10-CM

## 2019-07-02 DIAGNOSIS — N183 Chronic kidney disease, stage 3 unspecified: Secondary | ICD-10-CM

## 2019-07-02 DIAGNOSIS — E782 Mixed hyperlipidemia: Secondary | ICD-10-CM

## 2019-07-02 MED ORDER — TORSEMIDE 20 MG PO TABS: 60.0000 mg | ORAL_TABLET | Freq: Every day | ORAL | 3 refills | Status: DC

## 2019-07-02 MED ORDER — TORSEMIDE 20 MG PO TABS: 60.0000 mg | ORAL_TABLET | Freq: Three times a day (TID) | ORAL | 3 refills | Status: DC

## 2019-07-02 NOTE — Telephone Encounter (Signed)
07/02/2019 2:46:07 PM - [name] & tips renewal  07/02/2019 Faxed [name] renewal application for [employer] Flextouch Inject 24 units under the skin daily at bedtime & Novofine 32G tips.[name]

## 2019-07-02 NOTE — Patient Instructions (Addendum)
Medication Instructions:  1- INCREASE Torsemide to Take 3 tablets (60 mg total) by mouth once daily. *If you need a refill on your cardiac medications before your next appointment, please call your pharmacy*  Lab Work: Your physician recommends that you return for lab work in: 1 week at the medical mall. ([facility]) No appt is needed. Hours are M-F 7AM- 6 PM.  If you have labs (blood work) drawn today and your tests are completely normal, you will receive your results only by: Marland Kitchen MyChart Message (if you have MyChart) OR . A paper copy in the mail If you have any lab test that is abnormal or we need to change your treatment, we will call you to review the results.  Testing/Procedures: None ordered   Follow-Up: At [facility], you and your health needs are our priority.  As part of our continuing mission to provide you with exceptional heart care, we have created designated Provider Care Teams.  These Care Teams include your primary Cardiologist (physician) and Advanced Practice Providers (APPs -  Physician Assistants and Nurse Practitioners) who all work together to provide you with the care you need, when you need it.  Your next appointment:   2 weeks  The format for your next appointment:   In Person  Provider:    You may see [name], MD or Murray Hodgkins, NP.   Other Instructions Referral to Electrophysiologist Dr. Caryl Comes Effective Jan 2021

## 2019-07-02 NOTE — Progress Notes (Signed)
Office Visit    Patient Name: Gabriel [name]. Date of Encounter: 07/02/2019  Primary Care Provider:  Alisa Graff, FNP Primary Cardiologist:  [name], MD  Chief Complaint    48 year old male with a history of chronic chest pain, chronically elevated troponin, nonischemic cardiomyopathy, hypertension, tobacco abuse, remote drug use, COPD, stage III chronic kidney disease, syncope, nonsustained VT, nonobstructive CAD, who presents for follow-up after recent hospitalization for heart failure.  Past Medical History    Past Medical History:  Diagnosis Date   Chest wall pain, chronic    Chronic systolic CHF (congestive heart failure) ([place])    a. 03/2015 Echo: EF 45-50%; b. 12/2015 Echo: EF 20-25%; c. 02/2016 Echo: EF 30-35%; d. 11/2016 Echo: EF 40-45%; e. 06/2019 Echo: EF 30-35%.   Chronic Troponin Elevation    CKD (chronic kidney disease), stage III    COPD (chronic obstructive pulmonary disease) (H[facility])    Diabetes mellitus without complication ([place])    Hypertension    Resolved since weight loss   NICM (nonischemic cardiomyopathy) ([place])    a. 03/2015 Echo: EF 45-50%; b. 04/2015 MV: small defect of mild severity in apex 2/2 apical thinning, EF 30-44%;  c. 12/2015 Echo: EF 20-25%; d. 02/2016 Echo: EF 30-35%; e. 11/2016 Echo: EF 40-45%, diff HK, mildly dil Ao root, mild MR, mod dil LA;  f. 12/2016 Cath: LM nl, LAD/Diags/LCX/OMs min irregs, RCA 40p/m/d; g. 06/2019 Echo: EF 30-35%, glob HK. Mod dil LA. RVSP 58.4 mmHg.   Non-obstructive CAD (coronary artery disease)    a. 04/2015 low risk MV;  b. 12/2016 Cath: minor irregs in LAD/Diag/LCX/OM, RCA 40p/m/d.   NSVT (nonsustained ventricular tachycardia) ([place])    a. 12/2015 noted on tele-->amio;  b. 12/2015 Event monitor: no VT. Atach noted.   Obesity (BMI 30.0-34.9)    Psoriasis    Renal insufficiency    Syncope    a. 01/2016 - felt to be vasovagal.   Tobacco abuse    Past Surgical History:  Procedure Laterality Date    AMPUTATION     CARDIAC CATHETERIZATION     FINGER AMPUTATION     Traumatic   FINGER FRACTURE SURGERY Left    LEFT HEART CATH AND CORONARY ANGIOGRAPHY N/A 01/06/2017   Procedure: Left Heart Cath and Coronary Angiography;  Surgeon: Wellington Hampshire, MD;  Location: [facility];  Service: Cardiovascular;  Laterality: N/A;    Allergies  Allergies  Allergen Reactions   Prednisone Other (See Comments)    Reaction: Hallucinations     Zantac [Ranitidine Hcl] Diarrhea and Nausea Only    Night sweats    History of Present Illness    48 year old male with above complex past medical history including nonischemic cardiomyopathy (EF 30 to 35%-November 2020), nonobstructive CAD (cath May 2018), hypertension, tobacco abuse, COPD, obesity, syncope (felt to be vasovagal-01/2016), nonsustained VT (noted on telemetry in May 2017-previously on amiodarone-follow-up monitoring did not show VT), stage III chronic kidney disease, chronic chest wall pain, and chronic mild troponin elevations.  He had multiple hospitalizations for chest pain and dyspnea dating back to August 2016, with mild persistent troponin elevations.  Is a low risk stress test in September 2016 subsequently underwent diagnostic catheterization May 2018 revealing 40% diffuse RCA disease with otherwise normal coronary arteries.  He was last seen in cardiology clinic in May 2019 but was admitted to [facility] last week in the setting of worsening dyspnea.  Echo showed an EF of 30 to 35%  with diffuse hypokinesis.  He received intravenous diuresis with reduction in weight from 245 pounds on admission to 238 pounds on discharge November 8.  He did have mild high-sensitivity troponin elevation to a peak of 65, though lab work was otherwise unremarkable.  QTC prolongation was noted in the absence of QT prolonging medications with recommendation for outpatient electrophysiology evaluation.  Since discharge, he has cont to have some  degree of chronic chest and chest wall pain which dates back several years.  He has returned to work and has noted intermittent dyspnea on exertion.  He has been weighing himself daily and says he is up 5 pounds on his home scale.  He is up almost 4 pounds pounds from discharge on our scale.  He admits to going out for breakfast every morning and then eating cold cuts for what he would consider his dinner at work (he works night shift).  He denies palpitations, PND, orthopnea, dizziness, syncope, edema, or early satiety.  He thinks his abdomen might be slightly tighter than when he was discharged from the hospital.  Home Medications    Prior to Admission medications   Medication Sig Start Date End Date Taking? Authorizing Provider  aspirin 81 MG chewable tablet Chew 1 tablet (81 mg total) by mouth daily. Reported on 02/08/2016 08/27/18   Alisa Graff, FNP  cloNIDine (CATAPRES) 0.1 MG tablet Take 1 tablet (0.1 mg total) by mouth 2 (two) times daily. 05/10/19   Alisa Graff, FNP  insulin detemir (LEVEMIR) 100 UNIT/ML injection Inject 0.24 mLs (24 Units total) into the skin at bedtime. 09/30/18   Tawni Millers, MD  metFORMIN (GLUCOPHAGE) 500 MG tablet Take 1 tablet (500mg ) with breakfast and 2 tablets (500mg ) at dinner. Patient taking differently: Take 500 mg by mouth 2 (two) times daily.  04/07/19   Tawni Millers, MD  metoprolol (TOPROL XL) 200 MG 24 hr tablet Take 1 tablet (200 mg total) by mouth daily. 08/27/18   Alisa Graff, FNP  nicotine (NICODERM CQ - DOSED IN MG/24 HOURS) 21 mg/24hr patch Place 1 patch (21 mg total) onto the skin daily. 06/28/19   [name], MD  nitroGLYCERIN (NITROSTAT) 0.4 MG SL tablet Place 1 tablet (0.4 mg total) under the tongue every 5 (five) minutes x 3 doses as needed for chest pain. 06/27/19   [name], MD  pantoprazole (PROTONIX) 20 MG tablet Take 1 tablet (20 mg total) by mouth at bedtime. 09/09/18   Tawni Millers, MD  rosuvastatin (CRESTOR) 20 MG tablet Take 1  tablet (20 mg total) by mouth daily. 08/27/18   Alisa Graff, FNP  sacubitril-valsartan (ENTRESTO) 97-103 MG Take 1 tablet by mouth 2 (two) times daily. 06/27/19   [name], MD  spironolactone (ALDACTONE) 25 MG tablet TAKE ONE TABLET BY MOUTH 2 TIMES A DAY 02/24/19   Tawni Millers, MD  torsemide (DEMADEX) 20 MG tablet Take 2 tablets (40 mg total) by mouth daily. Patient taking differently: Take 20 mg by mouth 2 (two) times daily.  03/03/19   Alisa Graff, FNP    Review of Systems    Some return of dyspnea on exertion since hospital discharge with 5 pound weight gain.  He denies chest pain, PND, orthopnea, palpitations, dizziness, syncope, edema, or early satiety.  All other systems reviewed and are otherwise negative except as noted above.  Physical Exam    VS:  BP 130/90 (BP Location: Left Arm, Patient Position: Sitting, Cuff Size: Normal)  Pulse 63    Temp (!) 97.1 F (36.2 C)    Ht 6' (1.829 m)    Wt 241 lb 8 oz (109.5 kg)    BMI 32.75 kg/m  , BMI Body mass index is 32.75 kg/m. GEN: Well nourished, well developed, in no acute distress. HEENT: normal. Neck: Supple, obese, difficult to gauge JVP.  No carotid bruits, or masses. Cardiac: RRR, no murmurs, rubs, or gallops. No clubbing, cyanosis, edema.  Radials/DP/PT 2+ and equal bilaterally.  Respiratory:  Respirations regular and unlabored, clear to auscultation bilaterally. GI: Semifirm, nontender, nondistended, BS + x 4. MS: no deformity or atrophy. Skin: warm and dry, no rash. Neuro:  Strength and sensation are intact. Psych: Normal affect.  Accessory Clinical Findings    ECG personally reviewed by me today -regular sinus rhythm, 63, left atrial enlargement, nonspecific T changes - no acute changes.  Lab Results  Component Value Date   WBC 9.2 06/27/2019   HGB 16.4 06/27/2019   HCT 50.2 06/27/2019   MCV 85.1 06/27/2019   PLT 252 06/27/2019   Lab Results  Component Value Date   CREATININE 1.09 06/27/2019   BUN 24 (H)  06/27/2019   NA 142 06/27/2019   K 4.1 06/27/2019   CL 103 06/27/2019   CO2 31 06/27/2019   Lab Results  Component Value Date   ALT 41 06/25/2019   AST 22 06/25/2019   ALKPHOS 73 06/25/2019   BILITOT 1.8 (H) 06/25/2019   Lab Results  Component Value Date   CHOL CANCELED 09/02/2018   HDL CANCELED 09/02/2018   LDLCALC Comment 07/29/2018   TRIG CANCELED 09/02/2018   CHOLHDL 6.6 (H) 07/29/2018     Assessment & Plan    1.  Acute on chronic systolic congestive heart failure/nonischemic cardiomyopathy: Patient recently hospitalized in the setting of progressive dyspnea and weight gain.  Admission weight was 245 pounds and he was discharged home at 238 pounds.  He has noted a 5 pound weight gain on his scale at home since discharge with return of dyspnea on exertion.  He is up almost 4 pounds on our scale since discharge.  He admits to dietary indiscretion with going out for breakfast every morning and then eating cold cuts for what he would consider his evening meal (he works nights).  We discussed the importance of daily weights, sodium restriction, medication compliance, and symptom reporting and he verbalizes understanding.  In light of higher sodium intake which he does not seem to think he will be able to change, I suspect he will need a slightly higher dose of torsemide.  I am increasing him from 40 mg daily to 60 mg daily.  Follow-up basic metabolic panel in 1 week.  Follow-up in clinic in 2 weeks.  He reports compliance with beta-blocker, Entresto, and spironolactone.  We did discuss referral to electrophysiology in the setting of persistent cardiomyopathy despite optimal medical therapy and prolonged QT.  He is willing to accept referral however would like to wait until January when his insurance changes.  2.  Prolonged QT: QTC 470 ms today.  486 during hospitalization despite normal potassium, magnesium, and calcium.  He is not on any QTC prolonging drugs.  As above, we discussed EP  referral in the setting of persistent nonischemic cardiomyopathy and he is willing to accept.  He would prefer to wait until January as outlined above.  3.  Essential hypertension: Blood pressure mildly elevated today at 130/90.  Increasing torsemide to 60 mg daily as  above.  Continue current doses of clonidine, metoprolol, and Entresto.  Could consider switching from metoprolol to carvedilol for improved blood pressure management if necessary.  4.  Chronic chest pain/nonobstructive CAD: No change in symptoms in over 4 years.  Previous negative stress test and catheterization revealing nonobstructive RCA disease in May 2018.  He remains on aspirin, statin, and beta-blocker therapy.  5.  Hyperlipidemia: He is on rosuvastatin therapy.  The last LDL we have on file was from 2017, which was 46.  Will f/u w/ labs next week.  6.  Tobacco abuse: Continues to smoke.  Is not particularly interested in quitting and does not wish to use a nicotine patch.  Complete cessation advised.  7.  Stage III chronic kidney disease: Creatinine was stable during recent admission.  We will follow-up a basic metabolic panel in 1 week given adjustment to torsemide.  He is on an ARB in the form of Entresto.  8.  DMII:  On metformin.  A1c 9.3 in Aug.  Will need f/u w/ PCP.  9. Disposition: Follow-up basic metabolic panel in 1 week.  Follow-up in clinic in 2 weeks or sooner if necessary.  Follow-up with EP in January.    Murray Hodgkins, NP 07/02/2019, 10:18 AM

## 2019-07-07 ENCOUNTER — Ambulatory Visit: Payer: Self-pay | Admitting: Internal Medicine

## 2019-07-09 ENCOUNTER — Telehealth: Payer: Self-pay | Admitting: Family

## 2019-07-09 ENCOUNTER — Ambulatory Visit: Payer: Self-pay | Admitting: Family

## 2019-07-09 ENCOUNTER — Telehealth: Payer: Self-pay | Admitting: Pharmacist

## 2019-07-09 NOTE — Progress Notes (Deleted)
Patient ID: Gabriel Wanger., male    DOB: 27-Jan-1971, 48 y.o.   MRN: [id number]  HPI  Gabriel [name] is a 48 y/o male with a history of HTN, diabetes, COPD, CKD, asthma, previous tobacco use and chronic heart failure.   Echo report from 06/26/2019 reviewed and showed an EF of 30-35% along with moderate elevated PA pressure. Echo done 04/29/17 showed EF 45-50%. Echo report that was done 11/28/16 and it showed an EF of 40-45% along with mild Gabriel. EF has improved from 30-35% July 2017.  Admitted 06/25/2019 due to   He presents today for a follow-up visit with a chief complaint of   Past Medical History:  Diagnosis Date  . Chest wall pain, chronic   . Chronic systolic CHF (congestive heart failure) ([place])    a. 03/2015 Echo: EF 45-50%; b. 12/2015 Echo: EF 20-25%; c. 02/2016 Echo: EF 30-35%; d. 11/2016 Echo: EF 40-45%; e. 06/2019 Echo: EF 30-35%.  . Chronic Troponin Elevation   . CKD (chronic kidney disease), stage III   . COPD (chronic obstructive pulmonary disease) ([place])   . Diabetes mellitus without complication ([place])   . Hypertension    Resolved since weight loss  . NICM (nonischemic cardiomyopathy) ([place])    a. 03/2015 Echo: EF 45-50%; b. 04/2015 MV: small defect of mild severity in apex 2/2 apical thinning, EF 30-44%;  c. 12/2015 Echo: EF 20-25%; d. 02/2016 Echo: EF 30-35%; e. 11/2016 Echo: EF 40-45%, diff HK, mildly dil Ao root, mild Gabriel, mod dil LA;  f. 12/2016 Cath: LM nl, LAD/Diags/LCX/OMs min irregs, RCA 40p/m/d; g. 06/2019 Echo: EF 30-35%, glob HK. Mod dil LA. RVSP 58.4 mmHg.  . Non-obstructive CAD (coronary artery disease)    a. 04/2015 low risk MV;  b. 12/2016 Cath: minor irregs in LAD/Diag/LCX/OM, RCA 40p/m/d.  [name] (nonsustained ventricular tachycardia) ([place])    a. 12/2015 noted on tele-->amio;  b. 12/2015 Event monitor: no VT. Atach noted.  . Obesity (BMI 30.0-34.9)   . Psoriasis   . Renal insufficiency   . Syncope    a. 01/2016 - felt to be vasovagal.  . Tobacco abuse    Past Surgical  History:  Procedure Laterality Date  . AMPUTATION    . CARDIAC CATHETERIZATION    . FINGER AMPUTATION     Traumatic  . FINGER FRACTURE SURGERY Left   . LEFT HEART CATH AND CORONARY ANGIOGRAPHY N/A 01/06/2017   Procedure: Left Heart Cath and Coronary Angiography;  Surgeon: Wellington Hampshire, MD;  Location: [facility];  Service: Cardiovascular;  Laterality: N/A;   Family History  Problem Relation Age of Onset  . Diabetes Mellitus II Mother   . Hypertension Father   . Cancer Paternal Aunt   . Cancer Maternal Grandfather   . Diabetes Maternal Grandfather    Social History   Tobacco Use  . Smoking status: Current Every Day Smoker    Packs/day: 0.50    Years: 33.00    Pack years: 16.50    Types: Cigarettes  . Smokeless tobacco: Never Used  . Tobacco comment: Quit a little over a month ago.  Substance Use Topics  . Alcohol use: Yes    Alcohol/week: 0.0 standard drinks    Frequency: Never    Comment: occassionally   Allergies  Allergen Reactions  . Prednisone Other (See Comments)    Reaction: Hallucinations    . Zantac [Ranitidine Hcl] Diarrhea and Nausea Only    Night sweats  Review of Systems  Constitutional: Positive for fatigue. Negative for appetite change.  HENT: Positive for congestion. Negative for postnasal drip and sore throat.   Eyes: Negative.   Respiratory: Negative for cough, chest tightness and shortness of breath.   Cardiovascular: Positive for chest pain ("just the usual") and palpitations. Negative for leg swelling.  Gastrointestinal: Negative for abdominal distention and abdominal pain.  Endocrine: Negative.   Genitourinary: Negative.   Musculoskeletal: Positive for back pain.  Skin: Negative.   Allergic/Immunologic: Negative.   Neurological: Positive for numbness (down both legs at times). Negative for dizziness, light-headedness and headaches.  Hematological: Negative for adenopathy. Does not bruise/bleed easily.   Psychiatric/Behavioral: Positive for sleep disturbance. Negative for dysphoric mood and suicidal ideas. The patient is not nervous/anxious.      Physical Exam  Constitutional: He is oriented to person, place, and time. He appears well-developed and well-nourished.  HENT:  Head: Normocephalic and atraumatic.  Neck: Normal range of motion. Neck supple. No JVD present.  Cardiovascular: Normal rate and regular rhythm.  Pulmonary/Chest: Effort normal. He has no wheezes. He has no rales. He exhibits no tenderness.  Abdominal: Soft. He exhibits no distension. There is no abdominal tenderness.  Musculoskeletal:        General: No tenderness or edema.  Neurological: He is alert and oriented to person, place, and time.  Skin: Skin is warm and dry.  Psychiatric: He has a normal mood and affect. His behavior is normal. Thought content normal.  Nursing note and vitals reviewed.   Assessment & Plan:  1: Chronic heart failure with reduced ejection fraction- - NYHA class II - euvolemic today - weighing daily and reminded to call for an overnight weight gain of >2 pounds or a weekly weight gain of >5 pounds.  - weight down 1 pound from last visit 8 months ago - not adding salt and he was encouraged to closely follow a 2000mg  sodium diet - saw cardiologist Gabriel [name]) 01/05/18  - BNP from 04/28/17 was 25.0  2: HTN- - BP looks much better today since taking clonidine; note given for his potential employer about what his BP was today - saw PCP @ [facility] 04/07/2019 - BMP from 03/31/2019 reviewed and showed sodium 138, potassium 4.2, creatinine 1.22 and GFR 69  3: Diabetes-   -  A1c 8.1% on 07/29/18 - says glucose at home yesterday was 239; has been eating some trail mix at night that has dehydrated bananas and mangos in it - followed by [facility] - completed some classes at the [facility]  Patient did not bring his medications nor a list. Each medication was verbally reviewed  with the patient and he was encouraged to bring the bottles to every visit to confirm accuracy of list.  Note written for his potential employer to allow him to wear his personal face mask since he has such irritation with the disposable masks.   Return in 3 months or sooner for any questions/problems before then.

## 2019-07-09 NOTE — Telephone Encounter (Signed)
Patient did not show for his [facility] Failure Clinic appointment on 07/09/2019. Will attempt to reschedule.

## 2019-07-09 NOTE — Telephone Encounter (Signed)
07/09/2019 2:40:22 PM - [name] script to [name] for refill  07/09/2019 I received a notice from [place] for [employer] refill on Entresto 97/103 I called in earlier this week, they need this script signed-sending interoffice to [name] to sign and return to [place].[name]

## 2019-07-18 NOTE — Progress Notes (Deleted)
Patient ID: Gabriel Koeppe., male   DOB: [birth year], 48 y.o.   MRN: [id number] Cardiology Office Note  Date:  07/18/2019   ID:  Gabriel Righter., DOB Jun 19, 1971, MRN [id number]  PCP:  Alisa Graff, FNP   No chief complaint on file.   HPI:  48 y.o. male with h/o  nonischemic cardiomyopathy by nuclear stress test in 2016, History of medication noncompliance chronic systolic CHF, ejection fraction 30-35%, up to 45% - 50% COPD,  ongoing tobacco abuse at 2 ppd since age 28,  DM2,  obesity  chronically elevated troponins given his chronic renal insufficiency. [place] on 01/03/16 with cough and SOB, septic shock, right upper lobe pneumonia,  He presents today for follow-up of his nonischemic cardiomyopathy  In hospital 06/26/2019 Records reviewed SOB, atypical chest pain Treated with lasix  Seen in the emergency room 11/16/2017 Chronic chest pain shortness of breath Low back pain pain radiating down left leg Normal renal function Emergency room told him that they do not do outpatient workup and do not provide pain medication  has a lawyer for disability Reports is unable to do anything gets winded and tired with exertion  Reports he has all of his medications Blood pressure somewhat better at home than it is in the office Typically running 150 in the office on several office visits Reports recent compliance with his medications  He did not complete cardiac rehabilitation her pulmonary rehabilitation Does not have a job, no money Father providing for everything per the patient Reports that he filled out some paperwork for the hospital and has not heard anything   was seen in the emergency room June 30, 2017 for hypertension Med noncompliant at the time  Chronic pain issues Reports that he quit smoking one month ago  HBA1C 7.1 Weight continues to run high  EKG personally reviewed by myself on todays visit Shows normal sinus rhythm with rate 89 bpm no significant  ST or T-wave changes  EKG personally reviewed by myself on todays visit, prior EKG from the emergency room Shows normal sinus rhythm  Other past medical history reviewed In hospital 04/2017 Hospital records reviewed with the patient in detail orthostatic lightheadedness in the setting of acute kidney injury. Creatinine 2.6 intravascular volume depletion Meds: Entresto,  torsemide to 40 mg daily,  metoprolol succinate to 50 mg daily. Spironolactone held  LVEF, now 45-50%.  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  does not have insurance, lives with his father Previously did not fill out paperwork for medical management/assistance with his medications as he does not want to give his father's information Currently unemployed Reports he is trying to [place], get disability Followed at [facility] 02/17/2016, admitted for chest pain/pneumonia He was put back on several medications for cardiomyopathy and blood pressure  Reports having sleep study in the past, was positive, did not tolerate CPAP.  Does not want to wear that, feels he sleeps fine  On prior office visit, going to the pool with his family, doing  various activities Does not feel that he can work, has bills to pay, has not worked Does not have money for medications  No episodes of syncope  Previous echocardiogram reviewed with him showing improving ejection fraction up to 30-35%, previously 25% in the setting of sepsis   Echocardiogram in the hospital  in the setting  of sepsis showed severely depressed ejection fraction 25% CXR showed right upper lobe consolidation c/w pneumonia.  started on IV Rocephin and azithromycin.  Troponin trend of 0.22-->0.19-->0.18-->1.05-->0.13 WBC count of 28,000 on arrival Febrile to a T max of 101.2 this admission.  Hypotensive with BP down to the 70's shortly after admission,   Prior to discharge was started on hydralazine, isosorbide, carvedilol. Secondary to acute renal failure, was not started on ACE inhibitor or ARB He did have short runs of nonsustained VT seen on telemetry   nonsustained VT on Holter, started on amiodarone, was asymptomatic  Early June 2017, had episode of syncope in his kitchen Denied any lightheadedness or dizziness, no warning, When out for a minute witnessed by his girlfriend, woke up on the floor Went to the hospital  He wore a 48-hour Holter monitor which was reviewed with him in detail today Monitor shows short rare runs of atrial tachycardia, also short rare episodes of nonsustained VT. He does report rare fluttering in his chest like a vibration otherwise no significant symptoms  Patient was previously admitted in August 2016 and September 2016 with atypical chest pain.  Echo during August admission showed mildly reduced LV systolic function with mildly elevated troponin and BNP. He was hypertensive and unable to afford his medications at that time.   He was discharged back in August 2016 on antihypertensives, but did nto take them. During his September 2016 admission for atypical chest pain and HTN he underwent nuclear stress testing that showed a small defect of mild severity in the apex location felt to be 2/2 apical thinning. EF was estimated at 30-44%. Overall, this was an intermediate risk scan. There was no evidence of significant perfusion abnormalities and he was felt to have NICM.   PMH:   has a past medical history of Chest wall pain, chronic, Chronic systolic CHF  (congestive heart failure) (HCC), Chronic Troponin Elevation, CKD (chronic kidney disease), stage III, COPD (chronic obstructive pulmonary disease) ([place]), Diabetes mellitus without complication ([place]), Hypertension, NICM (nonischemic cardiomyopathy) ([place]), Non-obstructive CAD (coronary artery disease), NSVT (nonsustained ventricular tachycardia) ([place]), Obesity (BMI 30.0-34.9), Psoriasis, Renal insufficiency, Syncope, and Tobacco abuse.  PSH:    Past Surgical History:  Procedure Laterality Date  . AMPUTATION    . CARDIAC CATHETERIZATION    . FINGER AMPUTATION     Traumatic  . FINGER FRACTURE SURGERY Left   . LEFT HEART CATH AND CORONARY ANGIOGRAPHY N/A 01/06/2017   Procedure: Left Heart Cath and Coronary Angiography;  Surgeon: Wellington Hampshire, MD;  Location: [facility];  Service: Cardiovascular;  Laterality: N/A;    Current Outpatient Medications  Medication Sig Dispense Refill  . aspirin 81 MG chewable tablet Chew 1 tablet (81 mg total) by mouth daily. Reported on 02/08/2016 90 tablet 3  . cloNIDine (CATAPRES) 0.1 MG tablet Take 1 tablet (0.1 mg total) by mouth 2 (two) times daily. 180 tablet 3  . insulin detemir (LEVEMIR) 100 UNIT/ML injection Inject 0.24 mLs (24 Units total) into the skin at bedtime. 10 mL 3  . metFORMIN (GLUCOPHAGE) 500 MG tablet Take 1 tablet (500mg ) with breakfast and 2 tablets (500mg ) at dinner. (  Patient taking differently: Take 500 mg by mouth 2 (two) times daily. ) 90 tablet 3  . metoprolol (TOPROL XL) 200 MG 24 hr tablet Take 1 tablet (200 mg total) by mouth daily. 90 tablet 3  . nitroGLYCERIN (NITROSTAT) 0.4 MG SL tablet Place 1 tablet (0.4 mg total) under the tongue every 5 (five) minutes x 3 doses as needed for chest pain. 30 tablet 0  . pantoprazole (PROTONIX) 20 MG tablet Take 1 tablet (20 mg total) by mouth at bedtime. 90 tablet 3  . rosuvastatin (CRESTOR) 20 MG tablet Take 1 tablet (20 mg total) by mouth daily. 90 tablet 3  . sacubitril-valsartan  (ENTRESTO) 97-103 MG Take 1 tablet by mouth 2 (two) times daily. 60 tablet 1  . spironolactone (ALDACTONE) 25 MG tablet TAKE ONE TABLET BY MOUTH 2 TIMES A DAY 180 tablet 0  . torsemide (DEMADEX) 20 MG tablet Take 3 tablets (60 mg total) by mouth daily. 180 tablet 3   No current facility-administered medications for this visit.      Allergies:   Prednisone and Zantac [ranitidine hcl]   Social History:  The patient  reports that he has been smoking Cigarettes.  He has been smoking about 2.00 packs per day for the past 33 years. He has never used smokeless tobacco. He reports that he drinks about 1.8 oz of alcohol per week. He reports that he does not use illicit drugs.   Family History:   family history includes Cancer in his maternal grandfather and paternal aunt; Diabetes in his maternal grandfather; Diabetes Mellitus II in his mother; Hypertension in his father.    Review of Systems: Review of Systems  Constitutional: Negative.   Respiratory: Positive for shortness of breath.   Cardiovascular: Negative.   Gastrointestinal: Negative.   Musculoskeletal: Positive for back pain.  Psychiatric/Behavioral: Negative.   All other systems reviewed and are negative.    PHYSICAL EXAM: VS:  There were no vitals taken for this visit. , BMI There is no height or weight on file to calculate BMI. Constitutional:  oriented to person, place, and time. No distress. obese HENT:  Head: Normocephalic and atraumatic.  Eyes:  no discharge. No scleral icterus.  Neck: Normal range of motion. Neck supple. No JVD present.  Cardiovascular: Normal rate, regular rhythm, normal heart sounds and intact distal pulses. Exam reveals no gallop and no friction rub. No edema No murmur heard. Pulmonary/Chest: Effort normal and breath sounds normal. No stridor. No respiratory distress.  no wheezes.  no rales.  no tenderness.  Abdominal: Soft.  no distension.  no tenderness.  Musculoskeletal: Normal range of motion.  no   tenderness or deformity.  Neurological:  normal muscle tone. Coordination normal. No atrophy Skin: Skin is warm and dry. No rash noted. not diaphoretic.  Psychiatric:  normal mood and affect. behavior is normal. Thought content normal.    Recent Labs: 09/09/2018: TSH 1.740 06/25/2019: ALT 41; B Natriuretic Peptide 948.0 06/27/2019: BUN 24; Creatinine, Ser 1.09; Hemoglobin 16.4; Magnesium 2.1; Platelets 252; Potassium 4.1; Sodium 142    Lipid Panel Lab Results  Component Value Date   CHOL CANCELED 09/02/2018   HDL CANCELED 09/02/2018   [place] Comment 07/29/2018   TRIG CANCELED 09/02/2018      Wt Readings from Last 3 Encounters:  07/02/19 241 lb 8 oz (109.5 kg)  06/27/19 238 lb (108 kg)  05/24/19 244 lb (110.7 kg)      ASSESSMENT AND PLAN:  Syncope, unspecified syncope type No further episodes  of syncope  We'll continue metoprolol increased up to twice a day dosing for blood pressure  Cardiomyopathy ([place]) - Prior history of medication noncompliance, inability to afford his medications  Tolerating Entresto and metoprolol succinate Doses have been increased Most recent ejection fraction 45% to 50%, normal being 55%  deconditioned, obese. Recommended he start a exercise program, start a walking program  Essential hypertension We will increase the metoprolol and entresto  stop the amlodipine in the setting of systolic dysfunction Continue torsemide daily Otherwise relatively stable  Chronic systolic heart failure (HCC) Taking torsemide 20 daily Potassium daily with Aldactone  Overall stable Ejection fraction dramatically improved on his medications up to 45-50% Shortness of breath symptoms likely from deconditioning and obesity Recommended alcohol cessation  oronary artery disease without angina Previous cardiac catheterization with nonobstructive disease,  unrelated to his cardiomyopathy Cardiopathy has since dramatically improved No further workup  needed Stable, he has quit smoking    Total encounter time more than 25 minutes  Greater than 50% was spent in counseling and coordination of care with the patient    No orders of the defined types were placed in this encounter.    Signed, Esmond Plants, M.D., Ph.D. 07/18/2019  [place], [place]

## 2019-07-19 ENCOUNTER — Ambulatory Visit: Payer: Medicaid Other | Admitting: Cardiovascular Disease

## 2019-07-24 ENCOUNTER — Encounter: Payer: Self-pay | Admitting: Emergency Medicine

## 2019-07-24 ENCOUNTER — Other Ambulatory Visit: Payer: Self-pay

## 2019-07-24 ENCOUNTER — Emergency Department
Admission: EM | Admit: 2019-07-24 | Discharge: 2019-07-25 | Disposition: A | Discharge status: Home / Self Care | Payer: Medicaid Other | Attending: Emergency Medicine | Admitting: Emergency Medicine

## 2019-07-24 DIAGNOSIS — R05 Cough: Secondary | ICD-10-CM | POA: Diagnosis present

## 2019-07-24 DIAGNOSIS — I10 Essential (primary) hypertension: Secondary | ICD-10-CM

## 2019-07-24 DIAGNOSIS — J449 Chronic obstructive pulmonary disease, unspecified: Secondary | ICD-10-CM | POA: Diagnosis not present

## 2019-07-24 DIAGNOSIS — I509 Heart failure, unspecified: Secondary | ICD-10-CM

## 2019-07-24 DIAGNOSIS — Z794 Long term (current) use of insulin: Secondary | ICD-10-CM | POA: Insufficient documentation

## 2019-07-24 DIAGNOSIS — N183 Chronic kidney disease, stage 3 unspecified: Secondary | ICD-10-CM | POA: Insufficient documentation

## 2019-07-24 DIAGNOSIS — Z20828 Contact with and (suspected) exposure to other viral communicable diseases: Secondary | ICD-10-CM | POA: Insufficient documentation

## 2019-07-24 DIAGNOSIS — Z7982 Long term (current) use of aspirin: Secondary | ICD-10-CM | POA: Diagnosis not present

## 2019-07-24 DIAGNOSIS — I130 Hypertensive heart and chronic kidney disease with heart failure and stage 1 through stage 4 chronic kidney disease, or unspecified chronic kidney disease: Secondary | ICD-10-CM | POA: Diagnosis not present

## 2019-07-24 DIAGNOSIS — Z79899 Other long term (current) drug therapy: Secondary | ICD-10-CM | POA: Insufficient documentation

## 2019-07-24 DIAGNOSIS — J069 Acute upper respiratory infection, unspecified: Secondary | ICD-10-CM

## 2019-07-24 DIAGNOSIS — F17210 Nicotine dependence, cigarettes, uncomplicated: Secondary | ICD-10-CM | POA: Insufficient documentation

## 2019-07-24 DIAGNOSIS — E1122 Type 2 diabetes mellitus with diabetic chronic kidney disease: Secondary | ICD-10-CM | POA: Insufficient documentation

## 2019-07-24 NOTE — ED Triage Notes (Signed)
Patient ambulatory to triage with complaints of sinus/head pressure with cough and sore throatfor 4 days.  Intermittent dull pressure in head. Pt reports taking Musinex this am.  Speaking in complete coherent sentences. No acute breathing distress noted.  Unable to visualize throat for plaques.  Pt with extensive cardiac hx

## 2019-07-25 ENCOUNTER — Emergency Department: Payer: Medicaid Other

## 2019-07-25 LAB — CBC WITH DIFFERENTIAL/PLATELET
Abs Immature Granulocytes: 0.04 K/uL (ref 0.00–0.07)
Basophils Absolute: 0.1 K/uL (ref 0.0–0.1)
Basophils Relative: 1 %
Eosinophils Absolute: 0.1 K/uL (ref 0.0–0.5)
Eosinophils Relative: 2 %
HCT: 46.4 % (ref 39.0–52.0)
Hemoglobin: 15.2 g/dL (ref 13.0–17.0)
Immature Granulocytes: 1 %
Lymphocytes Relative: 26 %
Lymphs Abs: 2.3 K/uL (ref 0.7–4.0)
MCH: 27.4 pg (ref 26.0–34.0)
MCHC: 32.8 g/dL (ref 30.0–36.0)
MCV: 83.8 fL (ref 80.0–100.0)
Monocytes Absolute: 0.4 K/uL (ref 0.1–1.0)
Monocytes Relative: 5 %
Neutro Abs: 5.6 K/uL (ref 1.7–7.7)
Neutrophils Relative %: 65 %
Platelets: 176 K/uL (ref 150–400)
RBC: 5.54 MIL/uL (ref 4.22–5.81)
RDW: 13.3 % (ref 11.5–15.5)
WBC: 8.5 K/uL (ref 4.0–10.5)
nRBC: 0 % (ref 0.0–0.2)

## 2019-07-25 LAB — BASIC METABOLIC PANEL
Anion gap: 8 (ref 5–15)
BUN: 17 mg/dL (ref 6–20)
CO2: 27 mmol/L (ref 22–32)
Calcium: 9.1 mg/dL (ref 8.9–10.3)
Chloride: 105 mmol/L (ref 98–111)
Creatinine, Ser: 1 mg/dL (ref 0.61–1.24)
GFR calc Af Amer: >60 mL/min (ref >60)
GFR calc non Af Amer: >60 mL/min (ref >60)
Glucose, Bld: 198 mg/dL — ABNORMAL HIGH (ref 70–99)
Potassium: 4.1 mmol/L (ref 3.5–5.1)
Sodium: 140 mmol/L (ref 135–145)

## 2019-07-25 LAB — SARS CORONAVIRUS 2 (TAT 6-24 HRS): SARS Coronavirus 2: NEGATIVE

## 2019-07-25 MED ORDER — HYDROCODONE-HOMATROPINE 5-1.5 MG/5ML PO SYRP: 5.0000 mL | ORAL_SOLUTION | Freq: Four times a day (QID) | ORAL | 0 refills | Status: DC | PRN

## 2019-07-25 MED ORDER — ALBUTEROL SULFATE HFA 108 (90 BASE) MCG/ACT IN AERS: INHALATION_SPRAY | RESPIRATORY_TRACT | 0 refills | Status: DC

## 2019-07-25 NOTE — ED Notes (Addendum)
Pt BP reading 192/111, cuff repositioned and BP retaken reading 172/109.

## 2019-07-25 NOTE — ED Notes (Addendum)
This RN introduced self to pt. Pt states he has had nasal congestion/cough/sore throat and head pressure for the past few days. Pt states he has also had light sensitivity, nausea and blurry vision. Pt A&O x4. Pt in NAD. Pt placed on monitor and lights dimmed to promote comfort. Will continue to monitor.

## 2019-07-25 NOTE — Discharge Instructions (Signed)
Fortunately your lab work is reassuring tonight and your chest x-ray looks about the same as it has in the past.  Most likely you are suffering from a viral illness, and COVID-19 is very possible.  We are sending a COVID-19 test and you can check in the computer on MyChart for the results within the next day or two (the paperwork will have instructions for how to establish a MyChart account if you do not already have one).  In the meantime please continue taking your regular medications as well as the medications I prescribed which may help with some of your symptoms.  Continue to isolate yourself at home as much as possible and do not go back to work at least until you are feeling better and until you know that your COVID-19 test was negative.  I strongly encourage you to call the heart failure clinic on Monday for a follow-up appointment with Ms. Hackney.  It looks like you did not go to your appointment last month but given your chronic medical conditions it is very important that you do so.  They will likely reach out to you on Monday for a follow-up appointment and as long as your COVID-19 test is negative you need to go see Ms. Hackney or one of her colleagues in the heart failure clinic on either Monday or Tuesday.    Return to the emergency department if you develop new or worsening symptoms that concern you.

## 2019-07-25 NOTE — ED Provider Notes (Signed)
[facility] Emergency Department Provider Note  ____________________________________________   First MD Initiated Contact with Patient 07/25/19 0013     (approximate)  I have reviewed the triage vital signs and the nursing notes.   HISTORY  Chief Complaint Nasal Congestion, Facial Pain, and Cough    HPI Gabriel N Tymaine Coello. is a 48 y.o. male with extensive chronic medical issues as listed below but who still works and is generally stable at baseline.  He presents tonight for evaluation of 4 days of nasal congestion, sinus pressure, runny nose, sore throat, cough, and "chest congestion".  He said he does not have any chest pain or pressure, he just feels some congestion and he is occasionally coughing up sputum and feels a frequent need to cough.  He feels generally worn down with low energy although he has been able to continue working.  He says he was tested for COVID-19 about 2 weeks ago and it was negative but his symptoms have developed over the last 4 days.  He has not had significant sinus issues in the past but says he does think he had a sinus infection years ago but it is not an ongoing problem for him.  He is having some pain when he swallows but no difficulty swallowing, eating, drinking are tolerating his own secretions.  No nausea or vomiting.  Ambulatory even though it makes him tired to do so.  No changes in his medications.  He describes his symptoms as severe.  He has not had his antihypertensive medications tonight.  Nothing particular is making it better or worse including the over-the-counter Mucinex he has been taking.   No known contacts with COVID-19 patients.        Past Medical History:  Diagnosis Date  . Chest wall pain, chronic   . Chronic systolic CHF (congestive heart failure) ([place])    a. 03/2015 Echo: EF 45-50%; b. 12/2015 Echo: EF 20-25%; c. 02/2016 Echo: EF 30-35%; d. 11/2016 Echo: EF 40-45%; e. 06/2019 Echo: EF 30-35%.  . Chronic  Troponin Elevation   . CKD (chronic kidney disease), stage III   . COPD (chronic obstructive pulmonary disease) ([place])   . Diabetes mellitus without complication ([place])   . Hypertension    Resolved since weight loss  . NICM (nonischemic cardiomyopathy) ([place])    a. 03/2015 Echo: EF 45-50%; b. 04/2015 MV: small defect of mild severity in apex 2/2 apical thinning, EF 30-44%;  c. 12/2015 Echo: EF 20-25%; d. 02/2016 Echo: EF 30-35%; e. 11/2016 Echo: EF 40-45%, diff HK, mildly dil Ao root, mild MR, mod dil LA;  f. 12/2016 Cath: LM nl, LAD/Diags/LCX/OMs min irregs, RCA 40p/m/d; g. 06/2019 Echo: EF 30-35%, glob HK. Mod dil LA. RVSP 58.4 mmHg.  . Non-obstructive CAD (coronary artery disease)    a. 04/2015 low risk MV;  b. 12/2016 Cath: minor irregs in LAD/Diag/LCX/OM, RCA 40p/m/d.  [name] (nonsustained ventricular tachycardia) ([place])    a. 12/2015 noted on tele-->amio;  b. 12/2015 Event monitor: no VT. Atach noted.  . Obesity (BMI 30.0-34.9)   . Psoriasis   . Renal insufficiency   . Syncope    a. 01/2016 - felt to be vasovagal.  . Tobacco abuse     Patient Active Problem List   Diagnosis Date Noted  . Acute CHF (congestive heart failure) ([place]) 06/26/2019  . CKD (chronic kidney disease), stage IIIa 06/26/2019  . Type II diabetes mellitus with renal manifestations ([place]) 06/26/2019  . Hypertensive urgency 06/26/2019  .  Acute on chronic combined systolic and diastolic CHF (congestive heart failure) ([place]) 09/02/2017  . Chest pain 08/24/2017  . Dizziness   . Noncompliance with medications 04/29/2017  . Back pain 03/06/2017  . Tobacco use 12/04/2016  . Gallbladder sludge 12/04/2016  . Coronary artery disease, non-occlusive 03/15/2016  . Atypical chest pain 02/16/2016  . Orthostatic hypotension 01/23/2016  . Hypomagnesemia 01/23/2016  . Congestive dilated cardiomyopathy ([place]) 01/17/2016  . NSVT (nonsustained ventricular tachycardia) ([place])   . Elevated troponin   . Hyperlipidemia 05/17/2015  . COPD (chronic  obstructive pulmonary disease) ([place]) 05/17/2015  . HTN (hypertension) 03/31/2015    Past Surgical History:  Procedure Laterality Date  . AMPUTATION    . CARDIAC CATHETERIZATION    . FINGER AMPUTATION     Traumatic  . FINGER FRACTURE SURGERY Left   . LEFT HEART CATH AND CORONARY ANGIOGRAPHY N/A 01/06/2017   Procedure: Left Heart Cath and Coronary Angiography;  Surgeon: Wellington Hampshire, MD;  Location: [facility];  Service: Cardiovascular;  Laterality: N/A;    Prior to Admission medications   Medication Sig Start Date End Date Taking? Authorizing Provider  albuterol (VENTOLIN HFA) 108 (90 Base) MCG/ACT inhaler Inhale 2-4 puffs by mouth every 4 hours as needed for wheezing, cough, and/or shortness of breath 07/25/19   [name], MD  aspirin 81 MG chewable tablet Chew 1 tablet (81 mg total) by mouth daily. Reported on 02/08/2016 08/27/18   Alisa Graff, FNP  cloNIDine (CATAPRES) 0.1 MG tablet Take 1 tablet (0.1 mg total) by mouth 2 (two) times daily. 05/10/19   Alisa Graff, FNP  HYDROcodone-homatropine (HYCODAN) 5-1.5 MG/5ML syrup Take 5 mLs by mouth every 6 (six) hours as needed for cough. 07/25/19   [name], MD  insulin detemir (LEVEMIR) 100 UNIT/ML injection Inject 0.24 mLs (24 Units total) into the skin at bedtime. 09/30/18   Tawni Millers, MD  metFORMIN (GLUCOPHAGE) 500 MG tablet Take 1 tablet (500mg ) with breakfast and 2 tablets (500mg ) at dinner. Patient taking differently: Take 500 mg by mouth 2 (two) times daily.  04/07/19   Tawni Millers, MD  metoprolol (TOPROL XL) 200 MG 24 hr tablet Take 1 tablet (200 mg total) by mouth daily. 08/27/18   Alisa Graff, FNP  nitroGLYCERIN (NITROSTAT) 0.4 MG SL tablet Place 1 tablet (0.4 mg total) under the tongue every 5 (five) minutes x 3 doses as needed for chest pain. 06/27/19   [name], MD  pantoprazole (PROTONIX) 20 MG tablet Take 1 tablet (20 mg total) by mouth at bedtime. 09/09/18   Tawni Millers, MD  rosuvastatin  (CRESTOR) 20 MG tablet Take 1 tablet (20 mg total) by mouth daily. 08/27/18   Alisa Graff, FNP  sacubitril-valsartan (ENTRESTO) 97-103 MG Take 1 tablet by mouth 2 (two) times daily. 06/27/19   [name], MD  spironolactone (ALDACTONE) 25 MG tablet TAKE ONE TABLET BY MOUTH 2 TIMES A DAY 02/24/19   Tawni Millers, MD  torsemide (DEMADEX) 20 MG tablet Take 3 tablets (60 mg total) by mouth daily. 07/02/19   Theora Gianotti, NP    Allergies Prednisone and Zantac [ranitidine hcl]  Family History  Problem Relation Age of Onset  . Diabetes Mellitus II Mother   . Hypertension Father   . Cancer Paternal Aunt   . Cancer Maternal Grandfather   . Diabetes Maternal Grandfather     Social History Social History   Tobacco Use  . Smoking status: Current Every  Day Smoker    Packs/day: 0.50    Years: 33.00    Pack years: 16.50    Types: Cigarettes  . Smokeless tobacco: Never Used  . Tobacco comment: Quit a little over a month ago.  Substance Use Topics  . Alcohol use: Yes    Alcohol/week: 0.0 standard drinks    Frequency: Never    Comment: occassionally  . Drug use: No    Review of Systems Constitutional: No fever/chills.  General malaise and fatigue. Eyes: No visual changes. ENT: Nasal congestion and some facial pressure, runny nose, sore throat, all x4 days. Cardiovascular: Denies chest pain.  Some discomfort when he coughs. Respiratory: Frequent cough, occasionally productive.  Some shortness of breath with exertion. Gastrointestinal: No abdominal pain.  No nausea, no vomiting.  No diarrhea.  No constipation. Genitourinary: Negative for dysuria. Musculoskeletal: Some body aches.  Negative for neck pain.  Negative for back pain. Integumentary: Negative for rash. Neurological: Negative for headaches, focal weakness or numbness.   ____________________________________________   PHYSICAL EXAM:  VITAL SIGNS: ED Triage Vitals  Enc Vitals Group     BP 07/24/19 2305 (!)  168/110     Pulse Rate 07/24/19 2305 94     Resp 07/24/19 2305 (!) 21     Temp 07/24/19 2305 98.6 F (37 C)     Temp Source 07/24/19 2305 Oral     SpO2 07/24/19 2305 96 %     Weight 07/24/19 2306 108.9 kg (240 lb)     Height 07/24/19 2306 1.829 m (6')     Head Circumference --      Peak Flow --      Pain Score 07/24/19 2340 3     Pain Loc --      Pain Edu? --      Excl. in [place]? --     Constitutional: Alert and oriented.  Appears uncomfortable is a from a respiratory illness but no acute distress and nontoxic appearance. Eyes: Conjunctivae are normal.  Head: Atraumatic. Nose: Obviously congested. Mouth/Throat: Patient is wearing a mask.  Upon visual examination, the patient has no exudate or erythema of the oropharynx.  No evidence of Ludwig's angina nor oral pharyngeal infection nor abscess. Neck: No stridor.  No meningeal signs.  Some tenderness to palpation bilaterally submandibularly, but no lymphadenopathy and no palpable Cardiovascular: Normal rate, regular rhythm. Good peripheral circulation. Grossly normal heart sounds. Respiratory: Normal respiratory effort.  No retractions.  Occasional cough.  No accessory muscle usage. Gastrointestinal: Obese.  Soft and nontender. No distention.  Musculoskeletal: No lower extremity tenderness nor edema. No gross deformities of extremities. Neurologic:  Normal speech and language. No gross focal neurologic deficits are appreciated.  Skin:  Skin is warm, dry and intact. Psychiatric: Mood and affect are normal. Speech and behavior are normal.  ____________________________________________   LABS (all labs ordered are listed, but only abnormal results are displayed)  Labs Reviewed  BASIC METABOLIC PANEL - Abnormal; Notable for the following components:      Result Value   Glucose, Bld 198 (*)    All other components within normal limits  SARS CORONAVIRUS 2 (TAT 6-24 HRS)  CBC WITH DIFFERENTIAL/PLATELET    ____________________________________________  EKG  No indication for EKG ____________________________________________  RADIOLOGY Ursula Alert, personally viewed and evaluated these images (plain radiographs) as part of my medical decision making, as well as reviewing the written report by the radiologist.  ED MD interpretation:  Stable CXR from prior about a month ago  Official radiology report(s): Dg Chest Portable 1 View  Result Date: 07/25/2019 CLINICAL DATA:  Productive cough.  Sore throat. EXAM: PORTABLE CHEST 1 VIEW COMPARISON:  June 25, 2019 FINDINGS: Stable cardiomegaly. Increased interstitial markings bilaterally are similar in the interval. There is some atelectasis in the left mid lung. No pneumothorax. No other acute abnormalities. IMPRESSION: Increased interstitial markings may represent mild edema/pulmonary venous congestion versus atypical infection. Recommend clinical correlation. The findings are similar since June 25, 2019. Electronically Signed   By: Dorise Bullion III M.D   On: 07/25/2019 02:10    ____________________________________________   PROCEDURES   Procedure(s) performed (including Critical Care):  Procedures   ____________________________________________   INITIAL IMPRESSION / MDM / [place] / ED COURSE  As part of my medical decision making, I reviewed the following data within the [place] notes reviewed and incorporated, Labs reviewed ,  Old chart reviewed, Radiograph reviewed  and Notes from prior ED visits   Differential diagnosis includes, but is not limited to, COVID-19, other nonspecific respiratory infection, viral versus bacterial sinus infection, community-acquired pneumonia.  The patient is generally well-appearing in spite of his symptoms and his chronic medical conditions but does look uncomfortable as of suffering from a viral infection.  His vital signs are stable other than hypertension  and he did not take his medication tonight.  No gross abnormalities on physical exam.  I strongly suspect COVID-19.  I explained that there is little to be done about the sinus part of his symptoms given that most of the time sinusitis is viral and antibiotics are unlikely to be helpful.  I will check a chest x-ray to look for signs of pneumonia and we will check some basic lab work given his chronic medical conditions, but at this point I suspect supportive care will be appropriate.  He is a tobacco user and may benefit from an albuterol inhaler which she does not currently have at home.  Given the number of medications he takes and his chronic medical conditions I do not want to prescribe medications that may cause side effects but I think he may benefit from an albuterol inhaler and some cough medicine to use at night.  He agrees with this plan.  We will also swab him for COVID-19 and he knows that he needs to check the results in my chart and I explained that instructions about accessing MyChart will be available in his discharge summary.           ____________________________________________  FINAL CLINICAL IMPRESSION(S) / ED DIAGNOSES  Final diagnoses:  Viral URI with cough  Chronic congestive heart failure, unspecified heart failure type ([place])  Essential hypertension     MEDICATIONS GIVEN DURING THIS VISIT:  Medications - No data to display   ED Discharge Orders         Ordered    HYDROcodone-homatropine (HYCODAN) 5-1.5 MG/5ML syrup  Every 6 hours PRN     07/25/19 0239    albuterol (VENTOLIN HFA) 108 (90 Base) MCG/ACT inhaler     07/25/19 0239          *Please note:  [name] was evaluated in Emergency Department on 07/25/2019 for the symptoms described in the history of present illness. He was evaluated in the context of the global COVID-19 pandemic, which necessitated consideration that the patient might be at risk for infection with the SARS-CoV-2 virus that  causes COVID-19. Institutional protocols and algorithms that pertain to the  evaluation of patients at risk for COVID-19 are in a state of rapid change based on information released by regulatory bodies including the CDC and federal and state organizations. These policies and algorithms were followed during the patient's care in the ED.  Some ED evaluations and interventions may be delayed as a result of limited staffing during the pandemic.*  Note:  This document was prepared using Dragon voice recognition software and may include unintentional dictation errors.   [name], MD 07/25/19 0600

## 2019-08-03 NOTE — Progress Notes (Signed)
Virtual Visit via Telephone Note   Evaluation Performed:  Follow-up visit  This visit type was conducted due to national recommendations for restrictions regarding the COVID-19 Pandemic (e.g. social distancing).  This format is felt to be most appropriate for this patient at this time.  All issues noted in this document were discussed and addressed.  No physical exam was performed (except for noted visual exam findings with Video Visits).  Please refer to the patient's chart (MyChart message for video visits and phone note for telephone visits) for the patient's consent to telehealth for [facility]  Date:  08/04/2019   ID:  Gabriel Righter., DOB 15-Oct-1970, MRN [id number]  Patient Location:  [place] [id number]   Provider location:   [facility] [place] [address], [place] [address]  PCP:  Alisa Graff, FNP  Cardiologist:  [name], MD  Electrophysiologist:  None   Chief Complaint:  Shortness of breath  History of Present Illness:    Gabriel Porras. is a 48 y.o. male who presents via audio/video conferencing for a telehealth visit today.  Patient verified DOB and address.  The patient does not have symptoms concerning for COVID-19 infection (fever, chills, cough, or new SHORTNESS OF BREATH).   Patient reports minimal shortness of breath upon moderate exertion. He describes this as chronic in nature having been present for several years. He has associated fatigue, cough, stable chest pain and increasing palpitations along with this. He denies any dizziness, pedal edema, abdominal distention, wheezing, difficulty sleeping (does work night shift) or weight gain.   Is scheduled to see EP Caryl Comes January 2021 after his insurance changes.   Prior CV studies:   The following studies were reviewed today:  Echo report from 06/26/2019 reviewed and showed an EF of 30-35%.  Past Medical History:  Diagnosis Date  .  Chest wall pain, chronic   . Chronic systolic CHF (congestive heart failure) ([place])    a. 03/2015 Echo: EF 45-50%; b. 12/2015 Echo: EF 20-25%; c. 02/2016 Echo: EF 30-35%; d. 11/2016 Echo: EF 40-45%; e. 06/2019 Echo: EF 30-35%.  . Chronic Troponin Elevation   . CKD (chronic kidney disease), stage III   . COPD (chronic obstructive pulmonary disease) ([place])   . Diabetes mellitus without complication ([place])   . Hypertension    Resolved since weight loss  . NICM (nonischemic cardiomyopathy) ([place])    a. 03/2015 Echo: EF 45-50%; b. 04/2015 MV: small defect of mild severity in apex 2/2 apical thinning, EF 30-44%;  c. 12/2015 Echo: EF 20-25%; d. 02/2016 Echo: EF 30-35%; e. 11/2016 Echo: EF 40-45%, diff HK, mildly dil Ao root, mild MR, mod dil LA;  f. 12/2016 Cath: LM nl, LAD/Diags/LCX/OMs min irregs, RCA 40p/m/d; g. 06/2019 Echo: EF 30-35%, glob HK. Mod dil LA. RVSP 58.4 mmHg.  . Non-obstructive CAD (coronary artery disease)    a. 04/2015 low risk MV;  b. 12/2016 Cath: minor irregs in LAD/Diag/LCX/OM, RCA 40p/m/d.  [name] (nonsustained ventricular tachycardia) ([place])    a. 12/2015 noted on tele-->amio;  b. 12/2015 Event monitor: no VT. Atach noted.  . Obesity (BMI 30.0-34.9)   . Psoriasis   . Renal insufficiency   . Syncope    a. 01/2016 - felt to be vasovagal.  . Tobacco abuse    Past Surgical History:  Procedure Laterality Date  . AMPUTATION    . CARDIAC CATHETERIZATION    . FINGER AMPUTATION  Traumatic  . FINGER FRACTURE SURGERY Left   . LEFT HEART CATH AND CORONARY ANGIOGRAPHY N/A 01/06/2017   Procedure: Left Heart Cath and Coronary Angiography;  Surgeon: Wellington Hampshire, MD;  Location: [facility];  Service: Cardiovascular;  Laterality: N/A;     Current Meds  Medication Sig  . albuterol (VENTOLIN HFA) 108 (90 Base) MCG/ACT inhaler Inhale 2-4 puffs by mouth every 4 hours as needed for wheezing, cough, and/or shortness of breath  . aspirin 81 MG chewable tablet Chew 1 tablet (81 mg total) by  mouth daily. Reported on 02/08/2016  . cloNIDine (CATAPRES) 0.1 MG tablet Take 1 tablet (0.1 mg total) by mouth 2 (two) times daily.  . insulin detemir (LEVEMIR) 100 UNIT/ML injection Inject 0.24 mLs (24 Units total) into the skin at bedtime.  . metFORMIN (GLUCOPHAGE) 500 MG tablet Take 1 tablet (500mg ) with breakfast and 2 tablets (500mg ) at dinner. (Patient taking differently: Take 500 mg by mouth 2 (two) times daily. )  . metoprolol (TOPROL XL) 200 MG 24 hr tablet Take 1 tablet (200 mg total) by mouth daily.  . nitroGLYCERIN (NITROSTAT) 0.4 MG SL tablet Place 1 tablet (0.4 mg total) under the tongue every 5 (five) minutes x 3 doses as needed for chest pain.  . pantoprazole (PROTONIX) 20 MG tablet Take 1 tablet (20 mg total) by mouth at bedtime.  . rosuvastatin (CRESTOR) 20 MG tablet Take 1 tablet (20 mg total) by mouth daily.  . sacubitril-valsartan (ENTRESTO) 97-103 MG Take 1 tablet by mouth 2 (two) times daily.  Marland Kitchen spironolactone (ALDACTONE) 25 MG tablet TAKE ONE TABLET BY MOUTH 2 TIMES A DAY  . torsemide (DEMADEX) 20 MG tablet Take 3 tablets (60 mg total) by mouth daily. (Patient taking differently: Take 40 mg by mouth daily. )     Allergies:   Prednisone and Zantac [ranitidine hcl]   Social History   Tobacco Use  . Smoking status: Current Every Day Smoker    Packs/day: 0.50    Years: 33.00    Pack years: 16.50    Types: Cigarettes  . Smokeless tobacco: Never Used  . Tobacco comment: Quit a little over a month ago.  Substance Use Topics  . Alcohol use: Yes    Alcohol/week: 0.0 standard drinks    Comment: occassionally  . Drug use: No     Family Hx: The patient's family history includes Cancer in his maternal grandfather and paternal aunt; Diabetes in his maternal grandfather; Diabetes Mellitus II in his mother; Hypertension in his father.  ROS:   Please see the history of present illness.     All other systems reviewed and are negative.   Labs/Other Tests and Data  Reviewed:    Recent Labs: 09/09/2018: TSH 1.740 06/25/2019: ALT 41; B Natriuretic Peptide 948.0 06/27/2019: Magnesium 2.1 07/25/2019: BUN 17; Creatinine, Ser 1.00; Hemoglobin 15.2; Platelets 176; Potassium 4.1; Sodium 140   Recent Lipid Panel Lab Results  Component Value Date/Time   CHOL CANCELED 09/02/2018 09:44 AM   TRIG CANCELED 09/02/2018 09:44 AM   HDL CANCELED 09/02/2018 09:44 AM   CHOLHDL 6.6 (H) 07/29/2018 12:00 PM   CHOLHDL 5.3 01/04/2016 02:01 PM   LDLCALC Comment 07/29/2018 12:00 PM    Wt Readings from Last 3 Encounters:  08/04/19 240 lb (108.9 kg)  07/24/19 240 lb (108.9 kg)  07/02/19 241 lb 8 oz (109.5 kg)     Exam:    Vital Signs:  Wt 240 lb (108.9 kg) Comment: self-reported  BMI  32.55 kg/m    Well nourished, well developed male in no  acute distress.   ASSESSMENT & PLAN:    1. Chronic heart failure with reduced ejection fraction- - NYHA class II - euvolemic today per patient's symptoms - weighing daily and reminded to call for an overnight weight gain of >2 pounds or a weekly weight gain of >5 pounds.  - he says that his weight ranges from 238-241 pounds - not adding salt and he was encouraged to closely follow a 2000mg  sodium diet - saw cardiology Sharolyn Douglas) 07/02/2019 and had torsemide increased to 60mg  daily; patient reduced it down to 40mg  as he started having cramping which has since resolved - advised patient that he could take the additional 20mg  tablet for above weight gain, swelling or shortness of breath as needed - BNP from 04/28/17 was 25.0 - patient reports receiving his flu vaccine for this season - patient continues to smoke but is trying to reduce his amount  2: HTN- - not checking his BP at home - saw PCP @ [facility] 04/07/2019 - BMP from 03/31/2019 reviewed and showed sodium 138, potassium 4.2, creatinine 1.22 and GFR 69  3: Diabetes-   -  A1c 8.1% on 07/29/18 - says glucose at home has been running around 130's - followed by [facility] - completed some classes at the [facility]  COVID-19 Education: The signs and symptoms of COVID-19 were discussed with the patient and how to seek care for testing (follow up with PCP or arrange E-visit).  The importance of social distancing was discussed today.  Patient Risk:   After full review of this patients clinical status, I feel that they are at least moderate risk at this time.  Time:   Today, I have spent 8 minutes with the patient with telehealth technology discussing medications and symptoms to report.     Medication Adjustments/Labs and Tests Ordered: Current medicines are reviewed at length with the patient today.  Concerns regarding medicines are outlined above.   Tests Ordered: No orders of the defined types were placed in this encounter.  Medication Changes: No orders of the defined types were placed in this encounter.   Disposition: Follow-up in 3 months or sooner for any questions/problems before then.   Signed, Alisa Graff, FNP  08/04/2019 8:53 AM    [facility]

## 2019-08-04 ENCOUNTER — Encounter: Payer: Self-pay | Admitting: Family

## 2019-08-04 ENCOUNTER — Other Ambulatory Visit: Payer: Self-pay

## 2019-08-04 ENCOUNTER — Telehealth: Payer: Self-pay | Admitting: Family

## 2019-08-04 ENCOUNTER — Ambulatory Visit: Payer: MEDICAID | Attending: Family | Admitting: Family

## 2019-08-04 VITALS — Wt 240.0 lb

## 2019-08-04 DIAGNOSIS — E119 Type 2 diabetes mellitus without complications: Secondary | ICD-10-CM

## 2019-08-04 DIAGNOSIS — I10 Essential (primary) hypertension: Secondary | ICD-10-CM

## 2019-08-04 DIAGNOSIS — Z794 Long term (current) use of insulin: Secondary | ICD-10-CM

## 2019-08-04 DIAGNOSIS — I5022 Chronic systolic (congestive) heart failure: Secondary | ICD-10-CM

## 2019-08-04 NOTE — Telephone Encounter (Signed)
Virtual Visit Pre-Appointment Phone Call  "Mr Sandmeyer, I am calling you today to discuss your upcoming appointment. We are currently trying to limit exposure to the virus that causes COVID-19 by seeing patients at home rather than in the office."  1. "What is the BEST phone number to call the day of the visit?" - include this in appointment notes  2. "Do you have or have access to (through a family member/friend) a smartphone with video capability that we can use for your visit?" a. If yes - list this number in appt notes as "cell" (if different from BEST phone #) and list the appointment type as a VIDEO visit in appointment notes b. If no - list the appointment type as a PHONE visit in appointment notes  3. Confirm consent - "In the setting of the current Covid19 crisis, you are scheduled for a (phone or video) visit with your provider on (date) at (time).  Just as we do with many in-office visits, in order for you to participate in this visit, we must obtain consent.  If you'd like, I can send this to your mychart (if signed up) or email for you to review.  Otherwise, I can obtain your verbal consent now.  All virtual visits are billed to your insurance company just like a normal visit would be.  By agreeing to a virtual visit, we'd like you to understand that the technology does not allow for your provider to perform an examination, and thus may limit your provider's ability to fully assess your condition. If your provider identifies any concerns that need to be evaluated in person, we will make arrangements to do so.  Finally, though the technology is pretty good, we cannot assure that it will always work on either your or our end, and in the setting of a video visit, we may have to convert it to a phone-only visit.  In either situation, we cannot ensure that we have a secure connection.  Are you willing to proceed?" STAFF: Did the patient verbally acknowledge consent to telehealth visit? Document  YES/NO here: YES  4. Advise patient to be prepared - "Two hours prior to your appointment, go ahead and check your blood pressure, pulse, oxygen saturation, and your weight (if you have the equipment to check those) and write them all down. When your visit starts, your provider will ask you for this information. If you have an Apple Watch or Kardia device, please plan to have heart rate information ready on the day of your appointment. Please have a pen and paper handy nearby the day of the visit as well."  5. Give patient instructions for MyChart download to smartphone OR Doximity/Doxy.me as below if video visit (depending on what platform provider is using)  6. Inform patient they will receive a phone call 15 minutes prior to their appointment time (may be from unknown caller ID) so they should be prepared to answer    [place]. has been deemed a candidate for a follow-up tele-health visit to limit community exposure during the Covid-19 pandemic. I spoke with the patient via phone to ensure availability of phone/video source, confirm preferred email & phone number, and discuss instructions and expectations.  I reminded [name] to be prepared with any vital sign and/or heart rhythm information that could potentially be obtained via home monitoring, at the time of his visit. I reminded [name] to expect a phone  call prior to his visit.  Alisa Graff, [place] 08/04/2019 8:09 AM   INSTRUCTIONS FOR DOWNLOADING THE MYCHART APP TO SMARTPHONE  - The patient must first make sure to have activated MyChart and know their login information - If Apple, go to [employer] and type in MyChart in the search bar and download the app. If Android, ask patient to go to [employer] and type in [place] in the search bar and download the app. The app is free but as with any other app downloads, their phone may require them to verify saved payment information  or Apple/Android password.  - The patient will need to then log into the app with their MyChart username and password, and select [facility] as their healthcare provider to link the account. When it is time for your visit, go to the MyChart app, find appointments, and click Begin Video Visit. Be sure to Select Allow for your device to access the Microphone and Camera for your visit. You will then be connected, and your provider will be with you shortly.  **If they have any issues connecting, or need assistance please contact MyChart service desk [phone])**  **If using a computer, in order to ensure the best quality for their visit they will need to use either of the following Internet Browsers: [employer], or Google Chrome**  IF USING DOXIMITY or DOXY.ME - The patient will receive a link just prior to their visit by text.     FULL LENGTH CONSENT FOR TELE-HEALTH VISIT   I hereby voluntarily request, consent and authorize [facility] and its employed or contracted physicians, physician assistants, nurse practitioners or other licensed health care professionals (the Practitioner), to provide me with telemedicine health care services (the "Services") as deemed necessary by the treating Practitioner. I acknowledge and consent to receive the Services by the Practitioner via telemedicine. I understand that the telemedicine visit will involve communicating with the Practitioner through live audiovisual communication technology and the disclosure of certain medical information by electronic transmission. I acknowledge that I have been given the opportunity to request an in-person assessment or other available alternative prior to the telemedicine visit and am voluntarily participating in the telemedicine visit.  I understand that I have the right to withhold or withdraw my consent to the use of telemedicine in the course of my care at any time, without affecting my right to future care  or treatment, and that the Practitioner or I may terminate the telemedicine visit at any time. I understand that I have the right to inspect all information obtained and/or recorded in the course of the telemedicine visit and may receive copies of available information for a reasonable fee.  I understand that some of the potential risks of receiving the Services via telemedicine include:  Marland Kitchen Delay or interruption in medical evaluation due to technological equipment failure or disruption; . Information transmitted may not be sufficient (e.g. poor resolution of images) to allow for appropriate medical decision making by the Practitioner; and/or  . In rare instances, security protocols could fail, causing a breach of personal health information.  Furthermore, I acknowledge that it is my responsibility to provide information about my medical history, conditions and care that is complete and accurate to the best of my ability. I acknowledge that Practitioner's advice, recommendations, and/or decision may be based on factors not within their control, such as incomplete or inaccurate data provided by me or distortions of diagnostic images or specimens that may result  from electronic transmissions. I understand that the practice of medicine is not an exact science and that Practitioner makes no warranties or guarantees regarding treatment outcomes. I acknowledge that I will receive a copy of this consent concurrently upon execution via email to the email address I last provided but may also request a printed copy by calling the office of [facility].    I understand that my insurance will be billed for this visit.   I have read or had this consent read to me. . I understand the contents of this consent, which adequately explains the benefits and risks of the Services being provided via telemedicine.  . I have been provided ample opportunity to ask questions regarding this consent and the Services and  have had my questions answered to my satisfaction. . I give my informed consent for the services to be provided through the use of telemedicine in my medical care  By participating in this telemedicine visit I agree to the above.

## 2019-08-04 NOTE — Patient Instructions (Signed)
Continue weighing daily and call for an overnight weight gain of > 2 pounds or a weekly weight gain of >5 pounds. 

## 2019-09-02 ENCOUNTER — Other Ambulatory Visit: Payer: Self-pay

## 2019-09-02 ENCOUNTER — Ambulatory Visit (INDEPENDENT_AMBULATORY_CARE_PROVIDER_SITE_OTHER): Payer: Self-pay | Admitting: Internal Medicine

## 2019-09-02 ENCOUNTER — Encounter: Payer: Self-pay | Admitting: Internal Medicine

## 2019-09-02 VITALS — BP 152/112 | HR 85 | Ht 72.0 in | Wt 242.8 lb

## 2019-09-02 DIAGNOSIS — I5022 Chronic systolic (congestive) heart failure: Secondary | ICD-10-CM

## 2019-09-02 DIAGNOSIS — I428 Other cardiomyopathies: Secondary | ICD-10-CM

## 2019-09-02 NOTE — Progress Notes (Signed)
ELECTROPHYSIOLOGY CONSULT NOTE  Patient ID: Gabriel [name]., MRN: [id number], DOB/AGE: June 04, 1971 50 y.o. Admit date: (Not on file) Date of Consult: 09/02/2019  Primary Physician: Alisa Graff, FNP Primary Cardiologist: TG/CB     [name] is a 49 y.o. male who is being seen today for the evaluation of NICM and possible ICD at the request of TG/CB.    HPI Gabriel [name]. is a 49 y.o. male referred for consideration of an ICD for primary prevention  History of recurrent heart failure requiring hospitalizations most recently November 2020. Dyspnea on exertion at less than 1 flight of stairs and less than 100 yards.  PND and orthopnea.  Occasional peripheral edema.  No chest pain.  Episode of syncope 2017.  Abrupt in onset and offset.  Part of that time to be vasovagal.  Documented nonsustained ventricular tachycardia; recurrent palpitations unassociated with lightheadedness or shortness of breath  Sleep disordered breathing.  "Snores like a freight train "  ECGs have shown modest QT prolongation  DATE TEST EF   8/16 Echo   45-50 %   4/18 Echo  40-45%   5/18 LHC  Cors- min disease  11/20 Echo  30-35% PA pressure 58    Also noted to have QT prolongation with QTC is in the 470-490 range.      Past Medical History:  Diagnosis Date  . Chest wall pain, chronic   . Chronic systolic CHF (congestive heart failure) ([place])    a. 03/2015 Echo: EF 45-50%; b. 12/2015 Echo: EF 20-25%; c. 02/2016 Echo: EF 30-35%; d. 11/2016 Echo: EF 40-45%; e. 06/2019 Echo: EF 30-35%.  . Chronic Troponin Elevation   . CKD (chronic kidney disease), stage III   . COPD (chronic obstructive pulmonary disease) ([place])   . Diabetes mellitus without complication ([place])   . Hypertension    Resolved since weight loss  . NICM (nonischemic cardiomyopathy) ([place])    a. 03/2015 Echo: EF 45-50%; b. 04/2015 MV: small defect of mild severity in apex 2/2 apical thinning, EF 30-44%;  c. 12/2015 Echo:  EF 20-25%; d. 02/2016 Echo: EF 30-35%; e. 11/2016 Echo: EF 40-45%, diff HK, mildly dil Ao root, mild MR, mod dil LA;  f. 12/2016 Cath: LM nl, LAD/Diags/LCX/OMs min irregs, RCA 40p/m/d; g. 06/2019 Echo: EF 30-35%, glob HK. Mod dil LA. RVSP 58.4 mmHg.  . Non-obstructive CAD (coronary artery disease)    a. 04/2015 low risk MV;  b. 12/2016 Cath: minor irregs in LAD/Diag/LCX/OM, RCA 40p/m/d.  [name] (nonsustained ventricular tachycardia) ([place])    a. 12/2015 noted on tele-->amio;  b. 12/2015 Event monitor: no VT. Atach noted.  . Obesity (BMI 30.0-34.9)   . Psoriasis   . Renal insufficiency   . Syncope    a. 01/2016 - felt to be vasovagal.  . Tobacco abuse       Surgical History:  Past Surgical History:  Procedure Laterality Date  . AMPUTATION    . CARDIAC CATHETERIZATION    . FINGER AMPUTATION     Traumatic  . FINGER FRACTURE SURGERY Left   . LEFT HEART CATH AND CORONARY ANGIOGRAPHY N/A 01/06/2017   Procedure: Left Heart Cath and Coronary Angiography;  Surgeon: Wellington Hampshire, MD;  Location: [facility];  Service: Cardiovascular;  Laterality: N/A;     Home Meds: Current Meds  Medication Sig  . albuterol (VENTOLIN HFA) 108 (90 Base) MCG/ACT inhaler Inhale 2-4 puffs by mouth every 4 hours  as needed for wheezing, cough, and/or shortness of breath  . aspirin 81 MG chewable tablet Chew 1 tablet (81 mg total) by mouth daily. Reported on 02/08/2016  . cloNIDine (CATAPRES) 0.1 MG tablet Take 1 tablet (0.1 mg total) by mouth 2 (two) times daily.  . insulin detemir (LEVEMIR) 100 UNIT/ML injection Inject 0.24 mLs (24 Units total) into the skin at bedtime.  . metFORMIN (GLUCOPHAGE) 500 MG tablet Take 1 tablet (500mg ) with breakfast and 2 tablets (500mg ) at dinner. (Patient taking differently: Take 500 mg by mouth 2 (two) times daily. )  . metoprolol (TOPROL XL) 200 MG 24 hr tablet Take 1 tablet (200 mg total) by mouth daily.  . nitroGLYCERIN (NITROSTAT) 0.4 MG SL tablet Place 1 tablet (0.4 mg  total) under the tongue every 5 (five) minutes x 3 doses as needed for chest pain.  . pantoprazole (PROTONIX) 20 MG tablet Take 1 tablet (20 mg total) by mouth at bedtime.  . rosuvastatin (CRESTOR) 20 MG tablet Take 1 tablet (20 mg total) by mouth daily.  . sacubitril-valsartan (ENTRESTO) 97-103 MG Take 1 tablet by mouth 2 (two) times daily.  Marland Kitchen spironolactone (ALDACTONE) 25 MG tablet TAKE ONE TABLET BY MOUTH 2 TIMES A DAY  . torsemide (DEMADEX) 20 MG tablet Take 3 tablets (60 mg total) by mouth daily. (Patient taking differently: Take 40 mg by mouth daily. )    Allergies:  Allergies  Allergen Reactions  . Prednisone Other (See Comments)    Reaction: Hallucinations    . Zantac [Ranitidine Hcl] Diarrhea and Nausea Only    Night sweats    Social History   Socioeconomic History  . Marital status: Widowed    Spouse name: Not on file  . Number of children: 1  . Years of education: 53  . Highest education level: 12th grade  Occupational History  . Occupation: disabled  Tobacco Use  . Smoking status: Former Smoker    Packs/day: 0.50    Years: 33.00    Pack years: 16.50    Types: Cigarettes    Quit date: 08/18/2019    Years since quitting: 0.0  . Smokeless tobacco: Never Used  . Tobacco comment: Quit a little over a month ago.  Substance and Sexual Activity  . Alcohol use: Yes    Alcohol/week: 0.0 standard drinks    Comment: occassionally  . Drug use: No  . Sexual activity: Not Currently  Other Topics Concern  . Not on file  Social History Narrative      Social Determinants of Health   Financial Resource Strain:   . Difficulty of Paying Living Expenses: Not on file  Food Insecurity:   . Worried About Charity fundraiser in the Last Year: Not on file  . Ran Out of Food in the Last Year: Not on file  Transportation Needs:   . Lack of Transportation (Medical): Not on file  . Lack of Transportation (Non-Medical): Not on file  Physical Activity:   . Days of Exercise per  Week: Not on file  . Minutes of Exercise per Session: Not on file  Stress:   . Feeling of Stress : Not on file  Social Connections:   . Frequency of Communication with Friends and Family: Not on file  . Frequency of Social Gatherings with Friends and Family: Not on file  . Attends Religious Services: Not on file  . Active Member of Clubs or Organizations: Not on file  . Attends Archivist Meetings: Not on  file  . Marital Status: Not on file  Intimate Partner Violence:   . Fear of Current or Ex-Partner: Not on file  . Emotionally Abused: Not on file  . Physically Abused: Not on file  . Sexually Abused: Not on file     Family History  Problem Relation Age of Onset  . Diabetes Mellitus II Mother   . Hypertension Father   . Cancer Paternal Aunt   . Cancer Maternal Grandfather   . Diabetes Maternal Grandfather      ROS:  Please see the history of present illness.     All other systems reviewed and negative.    Physical Exam: Blood pressure (!) 152/112, pulse 85, height 6' (1.829 m), weight 242 lb 12 oz (110.1 kg), SpO2 97 %. General: Well developed, well nourished male in no acute distress. Head: Normocephalic, atraumatic, sclera non-icteric, no xanthomas, nares are without discharge. EENT: normal  Lymph Nodes:  none Neck: Negative for carotid bruits. JVD 8-10 cm Back:without scoliosis kyphosi Lungs: Clear bilaterally to auscultation without wheezes, rales, or rhonchi. Breathing is unlabored. Heart: RRR with S1 S2. No  murmur . No rubs, or gallops appreciated. Abdomen: Soft, non-tender, non-distended with normoactive bowel sounds. No hepatomegaly. No rebound/guarding. No obvious abdominal masses. Msk:  Strength and tone appear normal for age. Extremities: No clubbing or cyanosis. No edema.  Distal pedal pulses are 2+ and equal bilaterally. Skin: Warm and Dry Neuro: Alert and oriented X 3. CN III-XII intact Grossly normal sensory and motor function . Psych:  Responds  to questions appropriately with a normal affect.      Labs: Cardiac Enzymes No results for input(s): CKTOTAL, CKMB, TROPONINI in the last 72 hours. CBC Lab Results  Component Value Date   WBC 8.5 07/25/2019   HGB 15.2 07/25/2019   HCT 46.4 07/25/2019   MCV 83.8 07/25/2019   PLT 176 07/25/2019   PROTIME: No results for input(s): LABPROT, INR in the last 72 hours. Chemistry No results for input(s): NA, K, CL, CO2, BUN, CREATININE, CALCIUM, PROT, BILITOT, ALKPHOS, ALT, AST, GLUCOSE in the last 168 hours.  Invalid input(s): LABALBU Lipids Lab Results  Component Value Date   CHOL CANCELED 09/02/2018   HDL CANCELED 09/02/2018   [place] Comment 07/29/2018   TRIG CANCELED 09/02/2018   BNP No results found for: PROBNP Thyroid Function Tests: No results for input(s): TSH, T4TOTAL, T3FREE, THYROIDAB in the last 72 hours.  Invalid input(s): FREET3 Miscellaneous No results found for: DDIMER  Radiology/Studies:  No results found.  EKG: Sinus rhythm at 85 intervals 15/10/40 06/25/2019 QT  480 QTC about the same  07/02/2019 reviewed QT 440 ms QTC about 450 ms  Assessment and Plan:  Nonischemic cardiomyopathy  Syncope  Congestive heart failure-chronic-systolic class III  Sleep disordered breathing likely sleep apnea  Hypertension poorly controlled  QT prolongation   The patient has nonischemic cardiomyopathy with this history of syncope worrisome for arrhythmic event.  Given his depressed left ventricular function I recommended that we anticipate an ICD; he would like to push it off right now pending his application for insurance  His blood pressure is poorly controlled.  He may well benefit from hydralazine in conjunction with nitrates as an alternative to the clonidine.  I will have him follow-up with the heart failure clinic who can keep up with him closely.  Strongly encouraged him to get a sleep study.  I am not sure about the implications of his QT prolongation.   When he is well  compensated his QTC seems to be within range.  There is no family history to suggest long QT syndrome; his initial event while likely arrhythmic occurred in the context of a cardiomyopathy and I have reached out to one of the QT experts as to whether cardiomyopathy-decompensated kidney predictably associated with QT prolongation which has been my anecdotal impression  Dr Lurene Shadow [facility]) has not reveiwed the tracings but affirms the above observation>> I will forward him some of them   We will see him again when he is ready to proceed     Virl Axe

## 2019-09-02 NOTE — Patient Instructions (Signed)
Medication Instructions:  - Your physician recommends that you continue on your current medications as directed. Please refer to the Current Medication list given to you today.  *If you need a refill on your cardiac medications before your next appointment, please call your pharmacy*  Lab Work: - none ordered  If you have labs (blood work) drawn today and your tests are completely normal, you will receive your results only by: Marland Kitchen MyChart Message (if you have MyChart) OR . A paper copy in the mail If you have any lab test that is abnormal or we need to change your treatment, we will call you to review the results.  Testing/Procedures: - none ordered  Follow-Up: At [facility], you and your health needs are our priority.  As part of our continuing mission to provide you with exceptional heart care, we have created designated Provider Care Teams.  These Care Teams include your primary Cardiologist (physician) and Advanced Practice Providers (APPs -  Physician Assistants and Nurse Practitioners) who all work together to provide you with the care you need, when you need it.  Your next appointment:   2 month(s)  The format for your next appointment:   In Person  Provider:    [name], MD/ Ignacia Bayley, NP   Other Instructions - Call to follow up with [name], NP in the [facility] in 2 weeks [phone]  - Call our office back when you get insurance coverage and we will resume follow up with Dr. Caryl Comes at that time.

## 2019-09-04 ENCOUNTER — Other Ambulatory Visit: Payer: Self-pay

## 2019-09-04 DIAGNOSIS — E860 Dehydration: Secondary | ICD-10-CM | POA: Insufficient documentation

## 2019-09-04 DIAGNOSIS — E1165 Type 2 diabetes mellitus with hyperglycemia: Secondary | ICD-10-CM | POA: Insufficient documentation

## 2019-09-04 DIAGNOSIS — N183 Chronic kidney disease, stage 3 unspecified: Secondary | ICD-10-CM | POA: Insufficient documentation

## 2019-09-04 DIAGNOSIS — I130 Hypertensive heart and chronic kidney disease with heart failure and stage 1 through stage 4 chronic kidney disease, or unspecified chronic kidney disease: Secondary | ICD-10-CM | POA: Insufficient documentation

## 2019-09-04 DIAGNOSIS — R42 Dizziness and giddiness: Secondary | ICD-10-CM | POA: Diagnosis present

## 2019-09-04 DIAGNOSIS — Z79899 Other long term (current) drug therapy: Secondary | ICD-10-CM | POA: Diagnosis not present

## 2019-09-04 DIAGNOSIS — Z794 Long term (current) use of insulin: Secondary | ICD-10-CM | POA: Insufficient documentation

## 2019-09-04 DIAGNOSIS — Z87891 Personal history of nicotine dependence: Secondary | ICD-10-CM | POA: Insufficient documentation

## 2019-09-04 DIAGNOSIS — I5022 Chronic systolic (congestive) heart failure: Secondary | ICD-10-CM | POA: Diagnosis not present

## 2019-09-04 DIAGNOSIS — Z20822 Contact with and (suspected) exposure to covid-19: Secondary | ICD-10-CM | POA: Insufficient documentation

## 2019-09-04 DIAGNOSIS — Z7982 Long term (current) use of aspirin: Secondary | ICD-10-CM | POA: Insufficient documentation

## 2019-09-04 NOTE — ED Triage Notes (Signed)
Pt also complains of chest pain.  °

## 2019-09-04 NOTE — ED Triage Notes (Signed)
Pt states onset of dizziness approx one hour pta. Pt complains of nausea. Pt states has been shob for 2-3 weeks. perrl 58mm brisk. Pt moving all extremities.

## 2019-09-05 ENCOUNTER — Emergency Department: Payer: Medicaid Other

## 2019-09-05 ENCOUNTER — Emergency Department
Admission: EM | Admit: 2019-09-05 | Discharge: 2019-09-05 | Disposition: A | Discharge status: Home / Self Care | Payer: Medicaid Other | Attending: Emergency Medicine | Admitting: Emergency Medicine

## 2019-09-05 DIAGNOSIS — R739 Hyperglycemia, unspecified: Secondary | ICD-10-CM

## 2019-09-05 DIAGNOSIS — E860 Dehydration: Secondary | ICD-10-CM

## 2019-09-05 LAB — CBC
HCT: 54.9 % — ABNORMAL HIGH (ref 39.0–52.0)
Hemoglobin: 18 g/dL — ABNORMAL HIGH (ref 13.0–17.0)
MCH: 26.8 pg (ref 26.0–34.0)
MCHC: 32.8 g/dL (ref 30.0–36.0)
MCV: 81.7 fL (ref 80.0–100.0)
Platelets: 205 10*3/uL (ref 150–400)
RBC: 6.72 MIL/uL — ABNORMAL HIGH (ref 4.22–5.81)
RDW: 14.6 % (ref 11.5–15.5)
WBC: 9.1 10*3/uL (ref 4.0–10.5)
nRBC: 0 % (ref 0.0–0.2)

## 2019-09-05 LAB — COMPREHENSIVE METABOLIC PANEL
ALT: 26 U/L (ref 0–44)
AST: 24 U/L (ref 15–41)
Albumin: 3.8 g/dL (ref 3.5–5.0)
Alkaline Phosphatase: 71 U/L (ref 38–126)
Anion gap: 12 (ref 5–15)
BUN: 23 mg/dL — ABNORMAL HIGH (ref 6–20)
CO2: 25 mmol/L (ref 22–32)
Calcium: 8.9 mg/dL (ref 8.9–10.3)
Chloride: 98 mmol/L (ref 98–111)
Creatinine, Ser: 1.2 mg/dL (ref 0.61–1.24)
GFR calc Af Amer: >60 mL/min (ref >60)
GFR calc non Af Amer: >60 mL/min (ref >60)
Glucose, Bld: 447 mg/dL — ABNORMAL HIGH (ref 70–99)
Potassium: 4.1 mmol/L (ref 3.5–5.1)
Sodium: 135 mmol/L (ref 135–145)
Total Bilirubin: 1.7 mg/dL — ABNORMAL HIGH (ref 0.3–1.2)
Total Protein: 7.1 g/dL (ref 6.5–8.1)

## 2019-09-05 LAB — GLUCOSE, CAPILLARY: Glucose-Capillary: 240 mg/dL — ABNORMAL HIGH (ref 70–99)

## 2019-09-05 LAB — TROPONIN I (HIGH SENSITIVITY)
Troponin I (High Sensitivity): 37 ng/L — ABNORMAL HIGH (ref <18)
Troponin I (High Sensitivity): 39 ng/L — ABNORMAL HIGH (ref <18)

## 2019-09-05 LAB — POC SARS CORONAVIRUS 2 AG: SARS Coronavirus 2 Ag: NEGATIVE

## 2019-09-05 MED ORDER — SODIUM CHLORIDE 0.9 % IV BOLUS
1000.0000 mL | Freq: Once | INTRAVENOUS | Status: AC
  Administered 2019-09-05: 1000 mL via INTRAVENOUS

## 2019-09-05 MED ORDER — INSULIN ASPART 100 UNIT/ML ~~LOC~~ SOLN
10.0000 [IU] | Freq: Once | SUBCUTANEOUS | Status: AC
  Administered 2019-09-05: 10 [IU] via INTRAVENOUS
  Filled 2019-09-05: qty 1

## 2019-09-05 NOTE — ED Notes (Signed)
Pt sitting in lobby in no acute distress.  

## 2019-09-05 NOTE — ED Notes (Signed)
Peripheral IV discontinued. Catheter intact. No signs of infiltration or redness. Gauze applied to IV site.   Discharge instructions reviewed with patient. Questions fielded by this RN. Patient verbalizes understanding of instructions. Patient discharged home in stable condition per paduchowski. No acute distress noted at time of discharge.   Pt ambulatory to DC

## 2019-09-05 NOTE — ED Notes (Signed)
Pt updated on wait, pt verbalizes understanding.  °

## 2019-09-05 NOTE — ED Provider Notes (Signed)
[facility] Emergency Department Provider Note  Time seen: 4:01 AM  I have reviewed the triage vital signs and the nursing notes.   HISTORY  Chief Complaint Dizziness   HPI Gabriel [name]. is a 49 y.o. male with a past medical history of CHF, CKD, COPD, diabetes, hypertension, presents to the emergency department  for dizziness.  According to the patient for the past 5 days or so he has been feeling dizzy at times which she describes more as a lightheaded sensation.  Also has been experiencing muscle cramps and his blood sugar has been running high.  Patient states he is taking all of his medications.  States he has been experiencing body aches and muscle cramps, has had some mild shortness of breath and cough as well.  Denies any vomiting or diarrhea.  Past Medical History:  Diagnosis Date  . Chest wall pain, chronic   . Chronic systolic CHF (congestive heart failure) ([place])    a. 03/2015 Echo: EF 45-50%; b. 12/2015 Echo: EF 20-25%; c. 02/2016 Echo: EF 30-35%; d. 11/2016 Echo: EF 40-45%; e. 06/2019 Echo: EF 30-35%.  . Chronic Troponin Elevation   . CKD (chronic kidney disease), stage III   . COPD (chronic obstructive pulmonary disease) ([place])   . Diabetes mellitus without complication ([place])   . Hypertension    Resolved since weight loss  . NICM (nonischemic cardiomyopathy) ([place])    a. 03/2015 Echo: EF 45-50%; b. 04/2015 MV: small defect of mild severity in apex 2/2 apical thinning, EF 30-44%;  c. 12/2015 Echo: EF 20-25%; d. 02/2016 Echo: EF 30-35%; e. 11/2016 Echo: EF 40-45%, diff HK, mildly dil Ao root, mild MR, mod dil LA;  f. 12/2016 Cath: LM nl, LAD/Diags/LCX/OMs min irregs, RCA 40p/m/d; g. 06/2019 Echo: EF 30-35%, glob HK. Mod dil LA. RVSP 58.4 mmHg.  . Non-obstructive CAD (coronary artery disease)    a. 04/2015 low risk MV;  b. 12/2016 Cath: minor irregs in LAD/Diag/LCX/OM, RCA 40p/m/d.  [name] (nonsustained ventricular tachycardia) ([place])    a. 12/2015 noted on  tele-->amio;  b. 12/2015 Event monitor: no VT. Atach noted.  . Obesity (BMI 30.0-34.9)   . Psoriasis   . Renal insufficiency   . Syncope    a. 01/2016 - felt to be vasovagal.  . Tobacco abuse     Patient Active Problem List   Diagnosis Date Noted  . Acute CHF (congestive heart failure) ([place]) 06/26/2019  . CKD (chronic kidney disease), stage IIIa 06/26/2019  . Type II diabetes mellitus with renal manifestations ([place]) 06/26/2019  . Hypertensive urgency 06/26/2019  . Acute on chronic combined systolic and diastolic CHF (congestive heart failure) ([place]) 09/02/2017  . Chest pain 08/24/2017  . Dizziness   . Noncompliance with medications 04/29/2017  . Back pain 03/06/2017  . Tobacco use 12/04/2016  . Gallbladder sludge 12/04/2016  . Coronary artery disease, non-occlusive 03/15/2016  . Atypical chest pain 02/16/2016  . Orthostatic hypotension 01/23/2016  . Hypomagnesemia 01/23/2016  . Congestive dilated cardiomyopathy ([place]) 01/17/2016  . NSVT (nonsustained ventricular tachycardia) ([place])   . Elevated troponin   . Hyperlipidemia 05/17/2015  . COPD (chronic obstructive pulmonary disease) ([place]) 05/17/2015  . HTN (hypertension) 03/31/2015    Past Surgical History:  Procedure Laterality Date  . AMPUTATION    . CARDIAC CATHETERIZATION    . FINGER AMPUTATION     Traumatic  . FINGER FRACTURE SURGERY Left   . LEFT HEART CATH AND CORONARY ANGIOGRAPHY N/A 01/06/2017   Procedure:  Left Heart Cath and Coronary Angiography;  Surgeon: Wellington Hampshire, MD;  Location: [facility];  Service: Cardiovascular;  Laterality: N/A;    Prior to Admission medications   Medication Sig Start Date End Date Taking? Authorizing Provider  albuterol (VENTOLIN HFA) 108 (90 Base) MCG/ACT inhaler Inhale 2-4 puffs by mouth every 4 hours as needed for wheezing, cough, and/or shortness of breath 07/25/19   [name], MD  aspirin 81 MG chewable tablet Chew 1 tablet (81 mg total) by mouth daily. Reported on  02/08/2016 08/27/18   Alisa Graff, FNP  cloNIDine (CATAPRES) 0.1 MG tablet Take 1 tablet (0.1 mg total) by mouth 2 (two) times daily. 05/10/19   Alisa Graff, FNP  insulin detemir (LEVEMIR) 100 UNIT/ML injection Inject 0.24 mLs (24 Units total) into the skin at bedtime. 09/30/18   Tawni Millers, MD  metFORMIN (GLUCOPHAGE) 500 MG tablet Take 1 tablet (500mg ) with breakfast and 2 tablets (500mg ) at dinner. Patient taking differently: Take 500 mg by mouth 2 (two) times daily.  04/07/19   Tawni Millers, MD  metoprolol (TOPROL XL) 200 MG 24 hr tablet Take 1 tablet (200 mg total) by mouth daily. 08/27/18   Alisa Graff, FNP  nitroGLYCERIN (NITROSTAT) 0.4 MG SL tablet Place 1 tablet (0.4 mg total) under the tongue every 5 (five) minutes x 3 doses as needed for chest pain. 06/27/19   [name], MD  pantoprazole (PROTONIX) 20 MG tablet Take 1 tablet (20 mg total) by mouth at bedtime. 09/09/18   Tawni Millers, MD  rosuvastatin (CRESTOR) 20 MG tablet Take 1 tablet (20 mg total) by mouth daily. 08/27/18   Alisa Graff, FNP  sacubitril-valsartan (ENTRESTO) 97-103 MG Take 1 tablet by mouth 2 (two) times daily. 06/27/19   [name], MD  spironolactone (ALDACTONE) 25 MG tablet TAKE ONE TABLET BY MOUTH 2 TIMES A DAY 02/24/19   Tawni Millers, MD  torsemide (DEMADEX) 20 MG tablet Take 3 tablets (60 mg total) by mouth daily. Patient taking differently: Take 40 mg by mouth daily.  07/02/19   Theora Gianotti, NP    Allergies  Allergen Reactions  . Prednisone Other (See Comments)    Reaction: Hallucinations    . Zantac [Ranitidine Hcl] Diarrhea and Nausea Only    Night sweats    Family History  Problem Relation Age of Onset  . Diabetes Mellitus II Mother   . Hypertension Father   . Cancer Paternal Aunt   . Cancer Maternal Grandfather   . Diabetes Maternal Grandfather     Social History Social History   Tobacco Use  . Smoking status: Former Smoker    Packs/day: 0.50    Years: 33.00     Pack years: 16.50    Types: Cigarettes    Quit date: 08/18/2019    Years since quitting: 0.0  . Smokeless tobacco: Never Used  . Tobacco comment: Quit a little over a month ago.  Substance Use Topics  . Alcohol use: Yes    Alcohol/week: 0.0 standard drinks    Comment: occassionally  . Drug use: No    Review of Systems Constitutional: Negative for fever. Cardiovascular: Negative for chest pain. Respiratory: Slight shortness of breath.  Occasional cough. Gastrointestinal: Negative for abdominal pain Musculoskeletal: Body aches/muscle cramps. Neurological: Negative for headache All other ROS negative  ____________________________________________   PHYSICAL EXAM:  VITAL SIGNS: ED Triage Vitals  Enc Vitals Group     BP 09/04/19 2359 113/75  Pulse Rate 09/04/19 2359 72     Resp 09/04/19 2359 16     Temp 09/04/19 2359 98.2 F (36.8 C)     Temp Source 09/04/19 2359 Oral     SpO2 09/04/19 2359 96 %     Weight 09/04/19 2351 242 lb (109.8 kg)     Height 09/04/19 2351 6' (1.829 m)     Head Circumference --      Peak Flow --      Pain Score 09/04/19 2350 8     Pain Loc --      Pain Edu? --      Excl. in [place]? --    Constitutional: Alert and oriented. Well appearing and in no distress. Eyes: Normal exam ENT      Head: Normocephalic and atraumatic.      Mouth/Throat: Mucous membranes are moist. Cardiovascular: Normal rate, regular rhythm. No murmur Respiratory: Normal respiratory effort without tachypnea nor retractions. Breath sounds are clear  Gastrointestinal: Soft and nontender. No distention.  Musculoskeletal: Nontender with normal range of motion in all extremities.  Neurologic:  Normal speech and language. No gross focal neurologic deficits Skin:  Skin is warm, dry and intact.  Psychiatric: Mood and affect are normal.   ____________________________________________    EKG  EKG viewed and interpreted by myself shows a normal sinus rhythm at 74 bpm with a narrow  QRS, normal axis, normal intervals, nonspecific ST changes with slight ST depression in the lateral leads.  Largely unchanged from prior EKG.  ____________________________________________    RADIOLOGY  Chest x-ray is negative. CT scan of the head is negative.  ____________________________________________   INITIAL IMPRESSION / ASSESSMENT AND PLAN / ED COURSE  Pertinent labs & imaging results that were available during my care of the patient were reviewed by me and considered in my medical decision making (see chart for details).   Patient presents to the emergency department for dizziness occurring over the last week or so.  Patient's labs have resulted showing a slightly elevated troponin however repeat troponin is unchanged and these values are largely unchanged from prior values.  Patient's blood glucose is noted to be over 400 and his CBC appears hemoconcentrated.  We will dose IV fluids, insulin and continue to closely monitor.  Patient's dizziness could very likely be due to to dehydration from hyperglycemia.  We will also test for Covid given the patient's body aches with shortness of breath and occasional cough.  Patient's blood sugar is now down to 240.  Patient is feeling better after fluids.  We will discharge home.  Discussed with the patient low carbohydrate diet plenty of nonsugary fluids as well as taking his medications each day.  Dontae Diantonio. was evaluated in Emergency Department on 09/05/2019 for the symptoms described in the history of present illness. He was evaluated in the context of the global COVID-19 pandemic, which necessitated consideration that the patient might be at risk for infection with the SARS-CoV-2 virus that causes COVID-19. Institutional protocols and algorithms that pertain to the evaluation of patients at risk for COVID-19 are in a state of rapid change based on information released by regulatory bodies including the CDC and federal and state  organizations. These policies and algorithms were followed during the patient's care in the ED.  ____________________________________________   FINAL CLINICAL IMPRESSION(S) / ED DIAGNOSES  Hyperglycemia Dehydration Dizziness   Harvest Dark, MD 09/05/19 [phone]

## 2019-09-05 NOTE — ED Notes (Addendum)
ED Provider at bedside.  Pt c/o of 2-3 days dizziness and light headed, SOB and cough (productive brown phlegm) for the last 3 weeks since stopped smoking, pt reports recent "spikes" in blood sugar.    Pt c/o of being "sore all over", muscle spasm and cramps   Pt reports 2 weeks ago negative COVID test at [facility]

## 2019-09-05 NOTE — ED Notes (Signed)
Pt ambulatory to outside after being encouraged previously to sit in waiting room.

## 2019-09-05 NOTE — ED Notes (Signed)
Pt very concerned about the wait. Pt states "I need to lie down". Pt asking "where are the doctors". Pt informed of wait time, informed pt at this time there are no beds or recliners available for him to lie on. Pt verbalizes understanding.

## 2019-09-14 ENCOUNTER — Telehealth: Payer: Self-pay | Admitting: Pharmacist

## 2019-09-14 NOTE — Telephone Encounter (Signed)
09/14/2019 10:46:46 AM - [name] renewal to pt & provider  09/14/2019 Mailing patient his portion of [employer] application for renewal -[name], also requesting patient income/support & taxes/[id number], Interoffice provider @ [place] their portion to sign and return Interoffice.[name]

## 2019-09-16 NOTE — Progress Notes (Deleted)
Patient ID: Gabriel [name]., male    DOB: 16-Apr-1971, 49 y.o.   MRN: [id number]  HPI  [name] is a 49 y/o male with a history of HTN, diabetes, COPD, CKD, asthma, previous tobacco use and chronic heart failure.   Echo report from 06/26/2019 reviewed and showed an EF of 30-35% along with moderately elevated PA pressure. Echo done 04/29/17 showed EF 45-50%. Echo report that was done 11/28/16 and it showed an EF of 40-45% along with mild MR. EF has improved from 30-35% July 2017.  Was in the ED 09/05/19 due to dizziness. Glucose >400. IVF's and insulin given. COVID negative and he was released.   He presents today for a follow-up visit with a chief complaint of    Past Medical History:  Diagnosis Date  . Chest wall pain, chronic   . Chronic systolic CHF (congestive heart failure) ([place])    a. 03/2015 Echo: EF 45-50%; b. 12/2015 Echo: EF 20-25%; c. 02/2016 Echo: EF 30-35%; d. 11/2016 Echo: EF 40-45%; e. 06/2019 Echo: EF 30-35%.  . Chronic Troponin Elevation   . CKD (chronic kidney disease), stage III   . COPD (chronic obstructive pulmonary disease) ([place])   . Diabetes mellitus without complication ([place])   . Hypertension    Resolved since weight loss  . NICM (nonischemic cardiomyopathy) ([place])    a. 03/2015 Echo: EF 45-50%; b. 04/2015 MV: small defect of mild severity in apex 2/2 apical thinning, EF 30-44%;  c. 12/2015 Echo: EF 20-25%; d. 02/2016 Echo: EF 30-35%; e. 11/2016 Echo: EF 40-45%, diff HK, mildly dil Ao root, mild MR, mod dil LA;  f. 12/2016 Cath: LM nl, LAD/Diags/LCX/OMs min irregs, RCA 40p/m/d; g. 06/2019 Echo: EF 30-35%, glob HK. Mod dil LA. RVSP 58.4 mmHg.  . Non-obstructive CAD (coronary artery disease)    a. 04/2015 low risk MV;  b. 12/2016 Cath: minor irregs in LAD/Diag/LCX/OM, RCA 40p/m/d.  [name] (nonsustained ventricular tachycardia) ([place])    a. 12/2015 noted on tele-->amio;  b. 12/2015 Event monitor: no VT. Atach noted.  . Obesity (BMI 30.0-34.9)   . Psoriasis   . Renal  insufficiency   . Syncope    a. 01/2016 - felt to be vasovagal.  . Tobacco abuse    Past Surgical History:  Procedure Laterality Date  . AMPUTATION    . CARDIAC CATHETERIZATION    . FINGER AMPUTATION     Traumatic  . FINGER FRACTURE SURGERY Left   . LEFT HEART CATH AND CORONARY ANGIOGRAPHY N/A 01/06/2017   Procedure: Left Heart Cath and Coronary Angiography;  Surgeon: Wellington Hampshire, MD;  Location: [facility];  Service: Cardiovascular;  Laterality: N/A;   Family History  Problem Relation Age of Onset  . Diabetes Mellitus II Mother   . Hypertension Father   . Cancer Paternal Aunt   . Cancer Maternal Grandfather   . Diabetes Maternal Grandfather    Social History   Tobacco Use  . Smoking status: Former Smoker    Packs/day: 0.50    Years: 33.00    Pack years: 16.50    Types: Cigarettes    Quit date: 08/18/2019    Years since quitting: 0.0  . Smokeless tobacco: Never Used  . Tobacco comment: Quit a little over a month ago.  Substance Use Topics  . Alcohol use: Yes    Alcohol/week: 0.0 standard drinks    Comment: occassionally   Allergies  Allergen Reactions  . Prednisone Other (See Comments)  Reaction: Hallucinations    . Zantac [Ranitidine Hcl] Diarrhea and Nausea Only    Night sweats     Review of Systems  Constitutional: Positive for fatigue. Negative for appetite change.  HENT: Positive for congestion. Negative for postnasal drip and sore throat.   Eyes: Negative.   Respiratory: Negative for cough, chest tightness and shortness of breath.   Cardiovascular: Positive for chest pain ("just the usual") and palpitations. Negative for leg swelling.  Gastrointestinal: Negative for abdominal distention and abdominal pain.  Endocrine: Negative.   Genitourinary: Negative.   Musculoskeletal: Positive for back pain.  Skin: Negative.   Allergic/Immunologic: Negative.   Neurological: Positive for numbness (down both legs at times). Negative for dizziness,  light-headedness and headaches.  Hematological: Negative for adenopathy. Does not bruise/bleed easily.  Psychiatric/Behavioral: Positive for sleep disturbance. Negative for dysphoric mood and suicidal ideas. The patient is not nervous/anxious.      Physical Exam  Constitutional: He is oriented to person, place, and time. He appears well-developed and well-nourished.  HENT:  Head: Normocephalic and atraumatic.  Neck: No JVD present.  Cardiovascular: Normal rate and regular rhythm.  Pulmonary/Chest: Effort normal. He has no wheezes. He has no rales. He exhibits no tenderness.  Abdominal: Soft. He exhibits no distension. There is no abdominal tenderness.  Musculoskeletal:        General: No tenderness or edema.     Cervical back: Normal range of motion and neck supple.  Neurological: He is alert and oriented to person, place, and time.  Skin: Skin is warm and dry.  Psychiatric: He has a normal mood and affect. His behavior is normal. Thought content normal.  Nursing note and vitals reviewed.   Assessment & Plan:  1: Chronic heart failure with reduced ejection fraction- - NYHA class II - euvolemic today - weighing daily and reminded to call for an overnight weight gain of >2 pounds or a weekly weight gain of >5 pounds.  - weight 244 pound from last visit 4 months ago - not adding salt and he was encouraged to closely follow a 2000mg  sodium diet - saw cardiology Sharolyn Douglas) 07/02/2019  - saw EP Caryl Comes) 09/02/19 - BNP from 04/28/17 was 25.0  2: HTN- - BP - saw PCP @ [facility] 04/07/2019 - BMP from 09/05/19 reviewed and showed sodium 135, potassium 4.1, creatinine 1.2 and GFR >60  3: Diabetes-   -  A1c 9.3% on 03/31/2019 - says glucose  - followed by [facility] - completed some classes at the [facility]  Patient did not bring his medications nor a list. Each medication was verbally reviewed with the patient and he was encouraged to bring the bottles to every visit to  confirm accuracy of list.

## 2019-09-17 ENCOUNTER — Ambulatory Visit: Payer: Medicaid Other | Admitting: Family

## 2019-09-22 ENCOUNTER — Encounter: Payer: Self-pay | Admitting: Internal Medicine

## 2019-09-22 ENCOUNTER — Emergency Department: Payer: Medicaid Other

## 2019-09-22 ENCOUNTER — Other Ambulatory Visit: Payer: Self-pay

## 2019-09-22 ENCOUNTER — Observation Stay
Admission: EM | Admit: 2019-09-22 | Discharge: 2019-09-23 | Disposition: A | Discharge status: Home / Self Care | Payer: Medicaid Other | Attending: Hospitalist | Admitting: Hospitalist

## 2019-09-22 DIAGNOSIS — Z9114 Patient's other noncompliance with medication regimen: Secondary | ICD-10-CM | POA: Insufficient documentation

## 2019-09-22 DIAGNOSIS — J441 Chronic obstructive pulmonary disease with (acute) exacerbation: Secondary | ICD-10-CM | POA: Diagnosis present

## 2019-09-22 DIAGNOSIS — I2510 Atherosclerotic heart disease of native coronary artery without angina pectoris: Secondary | ICD-10-CM | POA: Diagnosis not present

## 2019-09-22 DIAGNOSIS — I1310 Hypertensive heart and chronic kidney disease without heart failure, with stage 1 through stage 4 chronic kidney disease, or unspecified chronic kidney disease: Secondary | ICD-10-CM | POA: Insufficient documentation

## 2019-09-22 DIAGNOSIS — Z20822 Contact with and (suspected) exposure to covid-19: Secondary | ICD-10-CM | POA: Diagnosis not present

## 2019-09-22 DIAGNOSIS — E785 Hyperlipidemia, unspecified: Secondary | ICD-10-CM | POA: Insufficient documentation

## 2019-09-22 DIAGNOSIS — R0789 Other chest pain: Principal | ICD-10-CM | POA: Diagnosis present

## 2019-09-22 DIAGNOSIS — E1169 Type 2 diabetes mellitus with other specified complication: Secondary | ICD-10-CM | POA: Diagnosis present

## 2019-09-22 DIAGNOSIS — I25118 Atherosclerotic heart disease of native coronary artery with other forms of angina pectoris: Secondary | ICD-10-CM | POA: Diagnosis present

## 2019-09-22 DIAGNOSIS — Z79899 Other long term (current) drug therapy: Secondary | ICD-10-CM | POA: Insufficient documentation

## 2019-09-22 DIAGNOSIS — Z794 Long term (current) use of insulin: Secondary | ICD-10-CM | POA: Diagnosis not present

## 2019-09-22 DIAGNOSIS — E1122 Type 2 diabetes mellitus with diabetic chronic kidney disease: Secondary | ICD-10-CM | POA: Insufficient documentation

## 2019-09-22 DIAGNOSIS — I160 Hypertensive urgency: Secondary | ICD-10-CM | POA: Diagnosis present

## 2019-09-22 DIAGNOSIS — E1121 Type 2 diabetes mellitus with diabetic nephropathy: Secondary | ICD-10-CM

## 2019-09-22 DIAGNOSIS — Z91148 Patient's other noncompliance with medication regimen for other reason: Secondary | ICD-10-CM

## 2019-09-22 DIAGNOSIS — G8929 Other chronic pain: Secondary | ICD-10-CM

## 2019-09-22 DIAGNOSIS — I472 Ventricular tachycardia: Secondary | ICD-10-CM

## 2019-09-22 DIAGNOSIS — R778 Other specified abnormalities of plasma proteins: Secondary | ICD-10-CM | POA: Diagnosis present

## 2019-09-22 DIAGNOSIS — I10 Essential (primary) hypertension: Secondary | ICD-10-CM

## 2019-09-22 DIAGNOSIS — Z7982 Long term (current) use of aspirin: Secondary | ICD-10-CM | POA: Insufficient documentation

## 2019-09-22 DIAGNOSIS — Z89029 Acquired absence of unspecified finger(s): Secondary | ICD-10-CM | POA: Insufficient documentation

## 2019-09-22 DIAGNOSIS — N1831 Chronic kidney disease, stage 3a: Secondary | ICD-10-CM

## 2019-09-22 DIAGNOSIS — I4729 Other ventricular tachycardia: Secondary | ICD-10-CM

## 2019-09-22 DIAGNOSIS — N183 Chronic kidney disease, stage 3 unspecified: Secondary | ICD-10-CM | POA: Diagnosis present

## 2019-09-22 DIAGNOSIS — E1129 Type 2 diabetes mellitus with other diabetic kidney complication: Secondary | ICD-10-CM | POA: Diagnosis present

## 2019-09-22 DIAGNOSIS — E119 Type 2 diabetes mellitus without complications: Secondary | ICD-10-CM

## 2019-09-22 DIAGNOSIS — J449 Chronic obstructive pulmonary disease, unspecified: Secondary | ICD-10-CM | POA: Diagnosis not present

## 2019-09-22 DIAGNOSIS — Z87891 Personal history of nicotine dependence: Secondary | ICD-10-CM | POA: Diagnosis not present

## 2019-09-22 DIAGNOSIS — R079 Chest pain, unspecified: Secondary | ICD-10-CM | POA: Diagnosis present

## 2019-09-22 DIAGNOSIS — I428 Other cardiomyopathies: Secondary | ICD-10-CM

## 2019-09-22 DIAGNOSIS — R748 Abnormal levels of other serum enzymes: Secondary | ICD-10-CM | POA: Diagnosis not present

## 2019-09-22 DIAGNOSIS — I429 Cardiomyopathy, unspecified: Secondary | ICD-10-CM | POA: Diagnosis not present

## 2019-09-22 DIAGNOSIS — R7989 Other specified abnormal findings of blood chemistry: Secondary | ICD-10-CM | POA: Diagnosis present

## 2019-09-22 DIAGNOSIS — I214 Non-ST elevation (NSTEMI) myocardial infarction: Secondary | ICD-10-CM

## 2019-09-22 LAB — COMPREHENSIVE METABOLIC PANEL
ALT: 43 U/L (ref 0–44)
AST: 35 U/L (ref 15–41)
Albumin: 3.5 g/dL (ref 3.5–5.0)
Alkaline Phosphatase: 59 U/L (ref 38–126)
Anion gap: 8 (ref 5–15)
BUN: 29 mg/dL — ABNORMAL HIGH (ref 6–20)
CO2: 24 mmol/L (ref 22–32)
Calcium: 8.7 mg/dL — ABNORMAL LOW (ref 8.9–10.3)
Chloride: 105 mmol/L (ref 98–111)
Creatinine, Ser: 1.41 mg/dL — ABNORMAL HIGH (ref 0.61–1.24)
GFR calc Af Amer: >60 mL/min (ref >60)
GFR calc non Af Amer: 58 mL/min — ABNORMAL LOW (ref >60)
Glucose, Bld: 159 mg/dL — ABNORMAL HIGH (ref 70–99)
Potassium: 3.7 mmol/L (ref 3.5–5.1)
Sodium: 137 mmol/L (ref 135–145)
Total Bilirubin: 1.6 mg/dL — ABNORMAL HIGH (ref 0.3–1.2)
Total Protein: 6.6 g/dL (ref 6.5–8.1)

## 2019-09-22 LAB — CBC
HCT: 48.2 % (ref 39.0–52.0)
Hemoglobin: 16.3 g/dL (ref 13.0–17.0)
MCH: 27 pg (ref 26.0–34.0)
MCHC: 33.8 g/dL (ref 30.0–36.0)
MCV: 79.9 fL — ABNORMAL LOW (ref 80.0–100.0)
Platelets: 186 10*3/uL (ref 150–400)
RBC: 6.03 MIL/uL — ABNORMAL HIGH (ref 4.22–5.81)
RDW: 14.5 % (ref 11.5–15.5)
WBC: 10.9 10*3/uL — ABNORMAL HIGH (ref 4.0–10.5)
nRBC: 0 % (ref 0.0–0.2)

## 2019-09-22 LAB — PROTIME-INR
INR: 1.1 (ref 0.8–1.2)
Prothrombin Time: 13.7 seconds (ref 11.4–15.2)

## 2019-09-22 LAB — TROPONIN I (HIGH SENSITIVITY)
Troponin I (High Sensitivity): 70 ng/L — ABNORMAL HIGH (ref <18)
Troponin I (High Sensitivity): 78 ng/L — ABNORMAL HIGH (ref <18)
Troponin I (High Sensitivity): 83 ng/L — ABNORMAL HIGH (ref <18)
Troponin I (High Sensitivity): 64 ng/L — ABNORMAL HIGH (ref <18)
Troponin I (High Sensitivity): 55 ng/L — ABNORMAL HIGH (ref <18)

## 2019-09-22 LAB — BRAIN NATRIURETIC PEPTIDE: B Natriuretic Peptide: 1178 pg/mL — ABNORMAL HIGH (ref 0.0–100.0)

## 2019-09-22 LAB — HEMOGLOBIN A1C
Hgb A1c MFr Bld: 9 % — ABNORMAL HIGH (ref 4.8–5.6)
Mean Plasma Glucose: 211.6 mg/dL

## 2019-09-22 LAB — GLUCOSE, CAPILLARY
Glucose-Capillary: 150 mg/dL — ABNORMAL HIGH (ref 70–99)
Glucose-Capillary: 184 mg/dL — ABNORMAL HIGH (ref 70–99)
Glucose-Capillary: 167 mg/dL — ABNORMAL HIGH (ref 70–99)

## 2019-09-22 LAB — MAGNESIUM: Magnesium: 1.6 mg/dL — ABNORMAL LOW (ref 1.7–2.4)

## 2019-09-22 LAB — SARS CORONAVIRUS 2 (TAT 6-24 HRS): SARS Coronavirus 2: NEGATIVE

## 2019-09-22 LAB — APTT: aPTT: 29 seconds (ref 24–36)

## 2019-09-22 MED ORDER — METOPROLOL SUCCINATE ER 100 MG PO TB24
200.0000 mg | ORAL_TABLET | Freq: Every day | ORAL | Status: DC
  Administered 2019-09-22 – 2019-09-23 (×2): 200 mg via ORAL
  Filled 2019-09-22: qty 4
  Filled 2019-09-22: qty 2

## 2019-09-22 MED ORDER — FUROSEMIDE 10 MG/ML IJ SOLN
40.0000 mg | Freq: Two times a day (BID) | INTRAMUSCULAR | Status: AC
  Administered 2019-09-22 (×2): 40 mg via INTRAVENOUS
  Filled 2019-09-22 (×2): qty 4

## 2019-09-22 MED ORDER — SACUBITRIL-VALSARTAN 97-103 MG PO TABS
1.0000 | ORAL_TABLET | Freq: Two times a day (BID) | ORAL | Status: DC
  Administered 2019-09-22 – 2019-09-23 (×3): 1 via ORAL
  Filled 2019-09-22 (×5): qty 1

## 2019-09-22 MED ORDER — HEPARIN (PORCINE) 25000 UT/250ML-% IV SOLN
1350.0000 [IU]/h | INTRAVENOUS | Status: DC
  Administered 2019-09-22: 1350 [IU]/h via INTRAVENOUS
  Filled 2019-09-22: qty 250

## 2019-09-22 MED ORDER — ASPIRIN 81 MG PO CHEW
324.0000 mg | CHEWABLE_TABLET | Freq: Once | ORAL | Status: AC
  Administered 2019-09-22: 324 mg via ORAL
  Filled 2019-09-22: qty 4

## 2019-09-22 MED ORDER — NITROGLYCERIN 2 % TD OINT
0.5000 [in_us] | TOPICAL_OINTMENT | Freq: Once | TRANSDERMAL | Status: AC
  Administered 2019-09-22: 0.5 [in_us] via TOPICAL

## 2019-09-22 MED ORDER — CLONIDINE HCL 0.1 MG PO TABS
0.1000 mg | ORAL_TABLET | Freq: Two times a day (BID) | ORAL | Status: DC
  Administered 2019-09-22 – 2019-09-23 (×3): 0.1 mg via ORAL
  Filled 2019-09-22 (×3): qty 1

## 2019-09-22 MED ORDER — MAGNESIUM SULFATE 2 GM/50ML IV SOLN
2.0000 g | Freq: Once | INTRAVENOUS | Status: AC
  Administered 2019-09-22: 2 g via INTRAVENOUS
  Filled 2019-09-22: qty 50

## 2019-09-22 MED ORDER — PANTOPRAZOLE SODIUM 20 MG PO TBEC
20.0000 mg | DELAYED_RELEASE_TABLET | Freq: Every day | ORAL | Status: DC
  Administered 2019-09-22: 20 mg via ORAL
  Filled 2019-09-22 (×2): qty 1

## 2019-09-22 MED ORDER — ENOXAPARIN SODIUM 40 MG/0.4ML ~~LOC~~ SOLN
40.0000 mg | Freq: Every day | SUBCUTANEOUS | Status: DC
  Administered 2019-09-22 – 2019-09-23 (×2): 40 mg via SUBCUTANEOUS
  Filled 2019-09-22 (×2): qty 0.4

## 2019-09-22 MED ORDER — INSULIN DETEMIR 100 UNIT/ML ~~LOC~~ SOLN
18.0000 [IU] | Freq: Every day | SUBCUTANEOUS | Status: DC
  Administered 2019-09-22: 18 [IU] via SUBCUTANEOUS
  Filled 2019-09-22 (×2): qty 0.18

## 2019-09-22 MED ORDER — ROSUVASTATIN CALCIUM 10 MG PO TABS
20.0000 mg | ORAL_TABLET | Freq: Every day | ORAL | Status: DC
  Administered 2019-09-22 – 2019-09-23 (×2): 20 mg via ORAL
  Filled 2019-09-22 (×2): qty 2

## 2019-09-22 MED ORDER — ALBUTEROL SULFATE (2.5 MG/3ML) 0.083% IN NEBU: 2.5000 mg | INHALATION_SOLUTION | Freq: Four times a day (QID) | RESPIRATORY_TRACT | Status: DC | PRN

## 2019-09-22 MED ORDER — ACETAMINOPHEN 325 MG PO TABS
650.0000 mg | ORAL_TABLET | ORAL | Status: DC | PRN
  Administered 2019-09-22: 650 mg via ORAL
  Filled 2019-09-22: qty 2

## 2019-09-22 MED ORDER — ASPIRIN 81 MG PO CHEW
81.0000 mg | CHEWABLE_TABLET | Freq: Every day | ORAL | Status: DC
  Administered 2019-09-22 – 2019-09-23 (×2): 81 mg via ORAL
  Filled 2019-09-22 (×2): qty 1

## 2019-09-22 MED ORDER — DICLOFENAC SODIUM 1 % EX GEL
4.0000 g | Freq: Four times a day (QID) | CUTANEOUS | Status: DC
  Administered 2019-09-22 – 2019-09-23 (×4): 4 g via TOPICAL
  Filled 2019-09-22: qty 100

## 2019-09-22 MED ORDER — DICYCLOMINE HCL 10 MG/5ML PO SOLN
10.0000 mg | Freq: Once | ORAL | Status: AC
  Administered 2019-09-22: 10 mg via ORAL
  Filled 2019-09-22 (×3): qty 5

## 2019-09-22 MED ORDER — MORPHINE SULFATE (PF) 4 MG/ML IV SOLN
4.0000 mg | Freq: Once | INTRAVENOUS | Status: AC
  Administered 2019-09-22: 4 mg via INTRAVENOUS
  Filled 2019-09-22: qty 1

## 2019-09-22 MED ORDER — ACETAMINOPHEN 325 MG PO TABS
ORAL_TABLET | ORAL | Status: AC
  Filled 2019-09-22: qty 2

## 2019-09-22 MED ORDER — HEPARIN BOLUS VIA INFUSION
4000.0000 [IU] | Freq: Once | INTRAVENOUS | Status: AC
  Administered 2019-09-22: 4000 [IU] via INTRAVENOUS
  Filled 2019-09-22: qty 4000

## 2019-09-22 MED ORDER — ACETAMINOPHEN 325 MG PO TABS
650.0000 mg | ORAL_TABLET | Freq: Once | ORAL | Status: AC
  Administered 2019-09-22: 650 mg via ORAL

## 2019-09-22 MED ORDER — SPIRONOLACTONE 25 MG PO TABS
25.0000 mg | ORAL_TABLET | Freq: Two times a day (BID) | ORAL | Status: DC
  Administered 2019-09-22 – 2019-09-23 (×3): 25 mg via ORAL
  Filled 2019-09-22 (×4): qty 1

## 2019-09-22 MED ORDER — NITROGLYCERIN 0.4 MG SL SUBL
0.4000 mg | SUBLINGUAL_TABLET | SUBLINGUAL | Status: DC | PRN
  Administered 2019-09-22 (×4): 0.4 mg via SUBLINGUAL
  Filled 2019-09-22 (×2): qty 1

## 2019-09-22 MED ORDER — INSULIN ASPART 100 UNIT/ML ~~LOC~~ SOLN
0.0000 [IU] | Freq: Three times a day (TID) | SUBCUTANEOUS | Status: DC
  Administered 2019-09-22: 4 [IU] via SUBCUTANEOUS
  Administered 2019-09-22: 3 [IU] via SUBCUTANEOUS
  Administered 2019-09-23: 4 [IU] via SUBCUTANEOUS
  Filled 2019-09-22 (×3): qty 1

## 2019-09-22 NOTE — ED Notes (Signed)
Pt pain level has remained a 10/10 with no relief with meds. MD notified.

## 2019-09-22 NOTE — ED Triage Notes (Signed)
Pt in with co chest pain x 3 days along with shob. States became worse tonight, pt has hx of chf, copd, and MI.

## 2019-09-22 NOTE — Progress Notes (Signed)
ANTICOAGULATION CONSULT NOTE - Initial Consult  Pharmacy Consult for Heparin Indication: chest pain/ACS  Allergies  Allergen Reactions  . Prednisone Other (See Comments)    Reaction: Hallucinations    . Zantac [Ranitidine Hcl] Diarrhea and Nausea Only    Night sweats    Patient Measurements: Height: 6' (182.9 cm) Weight: 245 lb (111.1 kg) IBW/kg (Calculated) : 77.6 HEPARIN DW (KG): 101.2  Vital Signs: Temp: 98.1 F (36.7 C) (02/03 0500) Temp Source: Oral (02/03 0500) BP: 181/130 (02/03 0630) Pulse Rate: 90 (02/03 0630)  Labs: Recent Labs    09/22/19 0516  HGB 16.3  HCT 48.2  PLT 186  CREATININE 1.41*  TROPONINIHS 70*    Estimated Creatinine Clearance: 80.7 mL/min (A) (by C-G formula based on SCr of 1.41 mg/dL (H)).   Medical History: Past Medical History:  Diagnosis Date  . Chest wall pain, chronic   . Chronic systolic CHF (congestive heart failure) ([place])    a. 03/2015 Echo: EF 45-50%; b. 12/2015 Echo: EF 20-25%; c. 02/2016 Echo: EF 30-35%; d. 11/2016 Echo: EF 40-45%; e. 06/2019 Echo: EF 30-35%.  . Chronic Troponin Elevation   . CKD (chronic kidney disease), stage III   . COPD (chronic obstructive pulmonary disease) ([place])   . Diabetes mellitus without complication ([place])   . Hypertension    Resolved since weight loss  . NICM (nonischemic cardiomyopathy) ([place])    a. 03/2015 Echo: EF 45-50%; b. 04/2015 MV: small defect of mild severity in apex 2/2 apical thinning, EF 30-44%;  c. 12/2015 Echo: EF 20-25%; d. 02/2016 Echo: EF 30-35%; e. 11/2016 Echo: EF 40-45%, diff HK, mildly dil Ao root, mild MR, mod dil LA;  f. 12/2016 Cath: LM nl, LAD/Diags/LCX/OMs min irregs, RCA 40p/m/d; g. 06/2019 Echo: EF 30-35%, glob HK. Mod dil LA. RVSP 58.4 mmHg.  . Non-obstructive CAD (coronary artery disease)    a. 04/2015 low risk MV;  b. 12/2016 Cath: minor irregs in LAD/Diag/LCX/OM, RCA 40p/m/d.  [name] (nonsustained ventricular tachycardia) ([place])    a. 12/2015 noted on tele-->amio;  b. 12/2015  Event monitor: no VT. Atach noted.  . Obesity (BMI 30.0-34.9)   . Psoriasis   . Renal insufficiency   . Syncope    a. 01/2016 - felt to be vasovagal.  . Tobacco abuse     Medications:  (Not in a hospital admission)   Assessment: Asked to initiate and monitor Heparin for ACS.  No anticoagulants per PTA med list.  Baseline labs ordered.  Goal of Therapy:  Heparin level 0.3-0.7 units/ml Monitor platelets by anticoagulation protocol: Yes   Plan:  Heparin 4000 units bolus x 1 then infusion at 1350 units/hr.  Check HL 6 hours after infusion started.   Hart Robinsons A 09/22/2019,7:09 AM

## 2019-09-22 NOTE — ED Notes (Signed)
Nitro did not help pain. Pt still rates pain 10/10. MD notified.

## 2019-09-22 NOTE — ED Provider Notes (Signed)
[facility] Emergency Department Provider Note  ____________________________________________   First MD Initiated Contact with Patient 09/22/19 0507     (approximate)  I have reviewed the triage vital signs and the nursing notes.   HISTORY  Chief Complaint Chest Pain   HPI Gabriel [name]. is a 49 y.o. male with below list of previous medical conditions including chronic kidney disease COPD chronic systolic congestive heart failure with last EF of 30 to 35% noted on November 2020 3-day history of generalized chest pain with accompanying dyspnea.  Patient states that the pain acutely worsened tonight with current pain score of 10 out of 10.  Patient states that the pain radiates to his back and arm.  Patient denies any fever does admit to occasional cough.    Past Medical History:  Diagnosis Date  . Chest wall pain, chronic   . Chronic systolic CHF (congestive heart failure) ([place])    a. 03/2015 Echo: EF 45-50%; b. 12/2015 Echo: EF 20-25%; c. 02/2016 Echo: EF 30-35%; d. 11/2016 Echo: EF 40-45%; e. 06/2019 Echo: EF 30-35%.  . Chronic Troponin Elevation   . CKD (chronic kidney disease), stage III   . COPD (chronic obstructive pulmonary disease) ([place])   . Diabetes mellitus without complication ([place])   . Hypertension    Resolved since weight loss  . NICM (nonischemic cardiomyopathy) ([place])    a. 03/2015 Echo: EF 45-50%; b. 04/2015 MV: small defect of mild severity in apex 2/2 apical thinning, EF 30-44%;  c. 12/2015 Echo: EF 20-25%; d. 02/2016 Echo: EF 30-35%; e. 11/2016 Echo: EF 40-45%, diff HK, mildly dil Ao root, mild MR, mod dil LA;  f. 12/2016 Cath: LM nl, LAD/Diags/LCX/OMs min irregs, RCA 40p/m/d; g. 06/2019 Echo: EF 30-35%, glob HK. Mod dil LA. RVSP 58.4 mmHg.  . Non-obstructive CAD (coronary artery disease)    a. 04/2015 low risk MV;  b. 12/2016 Cath: minor irregs in LAD/Diag/LCX/OM, RCA 40p/m/d.  [name] (nonsustained ventricular tachycardia) ([place])    a. 12/2015  noted on tele-->amio;  b. 12/2015 Event monitor: no VT. Atach noted.  . Obesity (BMI 30.0-34.9)   . Psoriasis   . Renal insufficiency   . Syncope    a. 01/2016 - felt to be vasovagal.  . Tobacco abuse     Patient Active Problem List   Diagnosis Date Noted  . Acute CHF (congestive heart failure) ([place]) 06/26/2019  . CKD (chronic kidney disease), stage IIIa 06/26/2019  . Type II diabetes mellitus with renal manifestations ([place]) 06/26/2019  . Hypertensive urgency 06/26/2019  . Acute on chronic combined systolic and diastolic CHF (congestive heart failure) ([place]) 09/02/2017  . Chest pain 08/24/2017  . Dizziness   . Noncompliance with medications 04/29/2017  . Back pain 03/06/2017  . Tobacco use 12/04/2016  . Gallbladder sludge 12/04/2016  . Coronary artery disease, non-occlusive 03/15/2016  . Atypical chest pain 02/16/2016  . Orthostatic hypotension 01/23/2016  . Hypomagnesemia 01/23/2016  . Congestive dilated cardiomyopathy ([place]) 01/17/2016  . NSVT (nonsustained ventricular tachycardia) ([place])   . Elevated troponin   . Hyperlipidemia 05/17/2015  . COPD (chronic obstructive pulmonary disease) ([place]) 05/17/2015  . HTN (hypertension) 03/31/2015    Past Surgical History:  Procedure Laterality Date  . AMPUTATION    . CARDIAC CATHETERIZATION    . FINGER AMPUTATION     Traumatic  . FINGER FRACTURE SURGERY Left   . LEFT HEART CATH AND CORONARY ANGIOGRAPHY N/A 01/06/2017   Procedure: Left Heart Cath and Coronary Angiography;  Surgeon: Wellington Hampshire, MD;  Location: [facility];  Service: Cardiovascular;  Laterality: N/A;    Prior to Admission medications   Medication Sig Start Date End Date Taking? Authorizing Provider  albuterol (VENTOLIN HFA) 108 (90 Base) MCG/ACT inhaler Inhale 2-4 puffs by mouth every 4 hours as needed for wheezing, cough, and/or shortness of breath 07/25/19   [name], MD  aspirin 81 MG chewable tablet Chew 1 tablet (81 mg total) by mouth daily.  Reported on 02/08/2016 08/27/18   Alisa Graff, FNP  cloNIDine (CATAPRES) 0.1 MG tablet Take 1 tablet (0.1 mg total) by mouth 2 (two) times daily. 05/10/19   Alisa Graff, FNP  insulin detemir (LEVEMIR) 100 UNIT/ML injection Inject 0.24 mLs (24 Units total) into the skin at bedtime. 09/30/18   Tawni Millers, MD  metFORMIN (GLUCOPHAGE) 500 MG tablet Take 1 tablet (500mg ) with breakfast and 2 tablets (500mg ) at dinner. Patient taking differently: Take 500 mg by mouth 2 (two) times daily.  04/07/19   Tawni Millers, MD  metoprolol (TOPROL XL) 200 MG 24 hr tablet Take 1 tablet (200 mg total) by mouth daily. 08/27/18   Alisa Graff, FNP  nitroGLYCERIN (NITROSTAT) 0.4 MG SL tablet Place 1 tablet (0.4 mg total) under the tongue every 5 (five) minutes x 3 doses as needed for chest pain. 06/27/19   [name], MD  pantoprazole (PROTONIX) 20 MG tablet Take 1 tablet (20 mg total) by mouth at bedtime. 09/09/18   Tawni Millers, MD  rosuvastatin (CRESTOR) 20 MG tablet Take 1 tablet (20 mg total) by mouth daily. 08/27/18   Alisa Graff, FNP  sacubitril-valsartan (ENTRESTO) 97-103 MG Take 1 tablet by mouth 2 (two) times daily. 06/27/19   [name], MD  spironolactone (ALDACTONE) 25 MG tablet TAKE ONE TABLET BY MOUTH 2 TIMES A DAY 02/24/19   Tawni Millers, MD  torsemide (DEMADEX) 20 MG tablet Take 3 tablets (60 mg total) by mouth daily. Patient taking differently: Take 40 mg by mouth daily.  07/02/19   Theora Gianotti, NP    Allergies Prednisone and Zantac [ranitidine hcl]  Family History  Problem Relation Age of Onset  . Diabetes Mellitus II Mother   . Hypertension Father   . Cancer Paternal Aunt   . Cancer Maternal Grandfather   . Diabetes Maternal Grandfather     Social History Social History   Tobacco Use  . Smoking status: Former Smoker    Packs/day: 0.50    Years: 33.00    Pack years: 16.50    Types: Cigarettes    Quit date: 08/18/2019    Years since quitting: 0.0  . Smokeless  tobacco: Never Used  . Tobacco comment: Quit a little over a month ago.  Substance Use Topics  . Alcohol use: Yes    Alcohol/week: 0.0 standard drinks    Comment: occassionally  . Drug use: No    Review of Systems Constitutional: No fever/chills Eyes: No visual changes. ENT: No sore throat. Cardiovascular: Positive for chest pain. Respiratory: Positive for shortness of breath. Gastrointestinal: No abdominal pain.  No nausea, no vomiting.  No diarrhea.  No constipation. Genitourinary: Negative for dysuria. Musculoskeletal: Negative for neck pain.  Negative for back pain. Integumentary: Negative for rash. Neurological: Negative for headaches, focal weakness or numbness.  ____________________________________________   PHYSICAL EXAM:  VITAL SIGNS: ED Triage Vitals  Enc Vitals Group     BP 09/22/19 0500 (!) 151/120  Pulse Rate 09/22/19 0500 98     Resp 09/22/19 0500 20     Temp 09/22/19 0500 98.1 F (36.7 C)     Temp Source 09/22/19 0500 Oral     SpO2 09/22/19 0500 98 %     Weight 09/22/19 0458 111.1 kg (245 lb)     Height 09/22/19 0458 1.829 m (6')     Head Circumference --      Peak Flow --      Pain Score 09/22/19 0458 10     Pain Loc --      Pain Edu? --      Excl. in [place]? --     Constitutional: Alert and oriented.  Apparent discomfort Eyes: Conjunctivae are normal.  Head: Atraumatic. Mouth/Throat: Patient is wearing a mask. Neck: No stridor.  No meningeal signs.   Cardiovascular: Normal rate, regular rhythm. Good peripheral circulation. Grossly normal heart sounds. Respiratory: Normal respiratory effort.  No retractions. Gastrointestinal: Soft and nontender. No distention.  Musculoskeletal: No lower extremity tenderness nor edema. No gross deformities of extremities. Neurologic:  Normal speech and language. No gross focal neurologic deficits are appreciated.  Skin:  Skin is warm, dry and intact. Psychiatric: Mood and affect are normal. Speech and behavior  are normal.  ____________________________________________   LABS (all labs ordered are listed, but only abnormal results are displayed)  Labs Reviewed  CBC - Abnormal; Notable for the following components:      Result Value   WBC 10.9 (*)    RBC 6.03 (*)    MCV 79.9 (*)    All other components within normal limits  COMPREHENSIVE METABOLIC PANEL - Abnormal; Notable for the following components:   Glucose, Bld 159 (*)    BUN 29 (*)    Creatinine, Ser 1.41 (*)    Calcium 8.7 (*)    Total Bilirubin 1.6 (*)    GFR calc non Af Amer 58 (*)    All other components within normal limits  MAGNESIUM - Abnormal; Notable for the following components:   Magnesium 1.6 (*)    All other components within normal limits  BRAIN NATRIURETIC PEPTIDE - Abnormal; Notable for the following components:   B Natriuretic Peptide 1,178.0 (*)    All other components within normal limits  TROPONIN I (HIGH SENSITIVITY) - Abnormal; Notable for the following components:   Troponin I (High Sensitivity) 70 (*)    All other components within normal limits  TROPONIN I (HIGH SENSITIVITY)   ____________________________________________  EKG  ED ECG REPORT I, Phillips N Lylla Eifler, the attending physician, personally viewed and interpreted this ECG.   Date: 09/22/2019  EKG Time: 4:59 AM  Rate: 95  Rhythm: Normal sinus rhythm with premature ventricular contraction  Axis: Normal  Intervals: Normal  ST&T Change: None  ____________________________________________  RADIOLOGY I, Galloway N Damante Spragg, personally viewed and evaluated these images (plain radiographs) as part of my medical decision making, as well as reviewing the written report by the radiologist.  ED MD interpretation:  Congestive/bronchitic changes on CXR  Official radiology report(s): DG Chest Port 1 View  Result Date: 09/22/2019 CLINICAL DATA:  Chest pain EXAM: PORTABLE CHEST 1 VIEW COMPARISON:  09/05/2019 FINDINGS: Chronic cardiomegaly accentuated  by low volumes and portable technique. Unremarkable mediastinal contours. Diffuse interstitial prominence with cephalized blood flow. No effusion or pneumothorax. IMPRESSION: Congestive or bronchitic interstitial opacity, favored congestive. Electronically Signed   By: Monte Fantasia M.D.   On: 09/22/2019 05:37     .Critical Care Performed by:  Gregor Hams, MD Authorized by: Gregor Hams, MD   Critical care provider statement:    Critical care time (minutes):  30   Critical care time was exclusive of:  Separately billable procedures and treating other patients   Critical care was necessary to treat or prevent imminent or life-threatening deterioration of the following conditions:  Circulatory failure   Critical care was time spent personally by me on the following activities:  Development of treatment plan with patient or surrogate, discussions with consultants, evaluation of patient's response to treatment, examination of patient, obtaining history from patient or surrogate, ordering and performing treatments and interventions, ordering and review of laboratory studies, ordering and review of radiographic studies, pulse oximetry, re-evaluation of patient's condition and review of old charts     ____________________________________________   INITIAL IMPRESSION / MDM / [place] / ED COURSE  As part of my medical decision making, I reviewed the following data within the electronic MEDICAL RECORD NUMBER   49 year old with above stated H&P with differential including CAD/ACS/CHF exacerbation/Hypertensive Urgency. EKG revealed no evidence os ischemia or infarction. Troponin positive with value of 70 with patient having ongoing pain despite IV Morphine 4mg . As such considered NSTEMI and heparin initiated after patient received Aspirin 324mg  po. Howeverthis may be Hypertensive emergency induced, as such plan to perform Troponin rule out. Patient discussed with hospitalist with plan  for admission      ____________________________________________  FINAL CLINICAL IMPRESSION(S) / ED DIAGNOSES  Final diagnoses:  NSTEMI (non-ST elevated myocardial infarction) ([place])  Hypertensive urgency     MEDICATIONS GIVEN DURING THIS VISIT:  Medications  aspirin chewable tablet 324 mg (324 mg Oral Given 09/22/19 0521)  morphine 4 MG/ML injection 4 mg (4 mg Intravenous Given 09/22/19 0521)  nitroGLYCERIN (NITROGLYN) 2 % ointment 0.5 inch (0.5 inches Topical Given 09/22/19 [id number])     ED Discharge Orders    None      *Please note:  Della Szakacs. was evaluated in Emergency Department on 09/22/2019 for the symptoms described in the history of present illness. He was evaluated in the context of the global COVID-19 pandemic, which necessitated consideration that the patient might be at risk for infection with the SARS-CoV-2 virus that causes COVID-19. Institutional protocols and algorithms that pertain to the evaluation of patients at risk for COVID-19 are in a state of rapid change based on information released by regulatory bodies including the CDC and federal and state organizations. These policies and algorithms were followed during the patient's care in the ED.  Some ED evaluations and interventions may be delayed as a result of limited staffing during the pandemic.*  Note:  This document was prepared using Dragon voice recognition software and may include unintentional dictation errors.   Gregor Hams, MD 09/22/19 2236

## 2019-09-22 NOTE — ED Notes (Signed)
Patient placed on 2 liters O2 due to o2 stat dropping below 90% while sleeping. EDP made aware.

## 2019-09-22 NOTE — ED Notes (Signed)
Patient states having chest pain that is shooting thur to his back and rates pain 9/10. Patient states having chest pain and difficult breathing x 3 days but became worse this morning. Patient states has  COPD and cold weather is not helping.

## 2019-09-22 NOTE — Progress Notes (Signed)
Patients BP remains high after scheduled metoprolol dose. MD notified. No new orders at this time.

## 2019-09-22 NOTE — Progress Notes (Signed)
Patient complaining of 10/10 chest pain since admission. Voltaren gel applied to chest 3 times today without relief. Gave sublingual nitro x1, patient refused 2nd dose. Patient refused tylenol. MD notified. No new orders at this time.

## 2019-09-22 NOTE — H&P (Signed)
History and Physical:    Gabriel [name].   ST:[id number] DOB: 1971-03-15 DOA: 09/22/2019  Referring MD/provider: Dr. Owens Shark PCP: Alisa Graff, FNP   Patient coming from: Home  Chief Complaint: Chest pain  History of Present Illness:   Gabriel [name]. is an 49 y.o. male with past medical history significant for nonischemic cardiomyopathy, HTN, DM2, chronic chest wall pain, nonobstructive coronary artery disease, CKD 2, noncompliance with medication who presents with chest pain.  Patient states that he has chronic intermittent chest pain but 3 days ago he developed chest pain that would not go away. Patient describes chest pain as "somebody sticking hot poker is in my lungs and it goes up to my back". Notes that it is present both at rest and with exertion without change and is not positional.  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  Also of note patient stopped taking all of his medications 1 week ago because he ran out of his medications.  Patient states he came into the hospital because his chest pain increased last night. Patient notes he is frustrated because he is unable to take NSAIDs due to his kidney disease.  He notes Tylenol is helpful but only temporarily.  He is also frustrated because no one will give him "stronger pain medications" for his chest pain.  Patient also notes he has not been taking his Metformin because it has been giving him diarrhea and vomiting.  He also stopped taking his insulin.  Patient also has chronic shortness of breath at rest and DOE.  Notes this is without change.  Denies palpitations, change in baseline dizziness.  Denies syncope or presyncope.  Denies fevers or chills or malaise.  Does admit to chronic GI distress with intermittent alternating constipation and diarrhea, this is also without change.  No new swelling of his legs.  No orthopnea.  No  PND.  ED Course:  The patient was treated with morphine and Nitropaste with minimal effect.  He was then given 3 sublingual nitroglycerin also with minimal effect.  He was noted to have a troponin of 70 and no acute changes on EKG.  Patient was started on a heparin drip and called in for admission for acute coronary syndrome.  ROS:   ROS   Review of Systems: General: Denies fever, chills, malaise,  Respiratory: Denies cough or hemoptysis GU: Denies dysuria, frequency or hematuria CNS: Denies HA, dizziness, confusion, new weakness or clumsiness. Musculoskeletal: Denies new back or joint pain Blood/lymphatics: Denies easy bruising or bleeding   Past Medical History:   Past Medical History:  Diagnosis Date  . Chest wall pain, chronic   . Chronic systolic CHF (congestive heart failure) ([place])    a. 03/2015 Echo: EF 45-50%; b. 12/2015 Echo: EF 20-25%; c. 02/2016 Echo: EF 30-35%; d. 11/2016 Echo: EF 40-45%; e. 06/2019 Echo: EF 30-35%.  . Chronic Troponin Elevation   . CKD (chronic kidney disease), stage III   . COPD (chronic obstructive pulmonary disease) ([name])   . Diabetes mellitus without complication ([place])   . Hypertension    Resolved since weight loss  . NICM (nonischemic cardiomyopathy) ([place])    a. 03/2015 Echo: EF 45-50%; b. 04/2015 MV: small defect of mild severity in apex 2/2 apical thinning, EF 30-44%;  c. 12/2015 Echo: EF 20-25%; d. 02/2016 Echo: EF 30-35%; e. 11/2016 Echo: EF 40-45%,  diff HK, mildly dil Ao root, mild MR, mod dil LA;  f. 12/2016 Cath: LM nl, LAD/Diags/LCX/OMs min irregs, RCA 40p/m/d; g. 06/2019 Echo: EF 30-35%, glob HK. Mod dil LA. RVSP 58.4 mmHg.  . Non-obstructive CAD (coronary artery disease)    a. 04/2015 low risk MV;  b. 12/2016 Cath: minor irregs in LAD/Diag/LCX/OM, RCA 40p/m/d.  [name] (nonsustained ventricular tachycardia) ([place])    a. 12/2015 noted on tele-->amio;  b. 12/2015 Event monitor: no VT. Atach noted.  . Obesity (BMI 30.0-34.9)   . Psoriasis   . Renal  insufficiency   . Syncope    a. 01/2016 - felt to be vasovagal.  . Tobacco abuse     Past Surgical History:   Past Surgical History:  Procedure Laterality Date  . AMPUTATION    . CARDIAC CATHETERIZATION    . FINGER AMPUTATION     Traumatic  . FINGER FRACTURE SURGERY Left   . LEFT HEART CATH AND CORONARY ANGIOGRAPHY N/A 01/06/2017   Procedure: Left Heart Cath and Coronary Angiography;  Surgeon: Wellington Hampshire, MD;  Location: [facility];  Service: Cardiovascular;  Laterality: N/A;    Social History:   Social History   Socioeconomic History  . Marital status: Widowed    Spouse name: Not on file  . Number of children: 1  . Years of education: 61  . Highest education level: 12th grade  Occupational History  . Occupation: disabled  Tobacco Use  . Smoking status: Former Smoker    Packs/day: 0.50    Years: 33.00    Pack years: 16.50    Types: Cigarettes    Quit date: 08/18/2019    Years since quitting: 0.0  . Smokeless tobacco: Never Used  . Tobacco comment: Quit a little over a month ago.  Substance and Sexual Activity  . Alcohol use: Yes    Alcohol/week: 0.0 standard drinks    Comment: occassionally  . Drug use: No  . Sexual activity: Not Currently  Other Topics Concern  . Not on file  Social History Narrative      Social Determinants of Health   Financial Resource Strain:   . Difficulty of Paying Living Expenses: Not on file  Food Insecurity:   . Worried About Charity fundraiser in the Last Year: Not on file  . Ran Out of Food in the Last Year: Not on file  Transportation Needs:   . Lack of Transportation (Medical): Not on file  . Lack of Transportation (Non-Medical): Not on file  Physical Activity:   . Days of Exercise per Week: Not on file  . Minutes of Exercise per Session: Not on file  Stress:   . Feeling of Stress : Not on file  Social Connections:   . Frequency of Communication with Friends and Family: Not on file  . Frequency of  Social Gatherings with Friends and Family: Not on file  . Attends Religious Services: Not on file  . Active Member of Clubs or Organizations: Not on file  . Attends Archivist Meetings: Not on file  . Marital Status: Not on file  Intimate Partner Violence:   . Fear of Current or Ex-Partner: Not on file  . Emotionally Abused: Not on file  . Physically Abused: Not on file  . Sexually Abused: Not on file    Allergies   Prednisone and Zantac [ranitidine hcl]  Family history:   Family History  Problem Relation Age of Onset  . Diabetes Mellitus  II Mother   . Hypertension Father   . Cancer Paternal Aunt   . Cancer Maternal Grandfather   . Diabetes Maternal Grandfather     Current Medications:   Prior to Admission medications   Medication Sig Start Date End Date Taking? Authorizing Provider  albuterol (VENTOLIN HFA) 108 (90 Base) MCG/ACT inhaler Inhale 2-4 puffs by mouth every 4 hours as needed for wheezing, cough, and/or shortness of breath 07/25/19  Yes [name], MD  aspirin 81 MG chewable tablet Chew 1 tablet (81 mg total) by mouth daily. Reported on 02/08/2016 08/27/18  Yes [name] A, FNP  cloNIDine (CATAPRES) 0.1 MG tablet Take 1 tablet (0.1 mg total) by mouth 2 (two) times daily. 05/10/19  Yes Hackney, Otila Kluver A, FNP  insulin detemir (LEVEMIR) 100 UNIT/ML injection Inject 0.24 mLs (24 Units total) into the skin at bedtime. 09/30/18  Yes Tawni Millers, MD  metFORMIN (GLUCOPHAGE) 500 MG tablet Take 1 tablet (500mg ) with breakfast and 2 tablets (500mg ) at dinner. Patient taking differently: Take 500 mg by mouth 2 (two) times daily.  04/07/19  Yes Tawni Millers, MD  metoprolol (TOPROL XL) 200 MG 24 hr tablet Take 1 tablet (200 mg total) by mouth daily. 08/27/18  Yes Hackney, Otila Kluver A, FNP  nitroGLYCERIN (NITROSTAT) 0.4 MG SL tablet Place 1 tablet (0.4 mg total) under the tongue every 5 (five) minutes x 3 doses as needed for chest pain. 06/27/19  Yes [name], MD    pantoprazole (PROTONIX) 20 MG tablet Take 1 tablet (20 mg total) by mouth at bedtime. 09/09/18  Yes Tawni Millers, MD  rosuvastatin (CRESTOR) 20 MG tablet Take 1 tablet (20 mg total) by mouth daily. 08/27/18  Yes Hackney, Tina A, FNP  sacubitril-valsartan (ENTRESTO) 97-103 MG Take 1 tablet by mouth 2 (two) times daily. 06/27/19  Yes [name], MD  spironolactone (ALDACTONE) 25 MG tablet TAKE ONE TABLET BY MOUTH 2 TIMES A DAY Patient taking differently: Take 25 mg by mouth 2 (two) times daily.  02/24/19  Yes Tawni Millers, MD  torsemide (DEMADEX) 20 MG tablet Take 3 tablets (60 mg total) by mouth daily. Patient taking differently: Take 40 mg by mouth daily.  07/02/19  Yes Theora Gianotti, NP    Physical Exam:   Vitals:   09/22/19 0734 09/22/19 0738 09/22/19 0743 09/22/19 0745  BP: (!) 184/121 (!) 175/121 (!) 176/115 (!) 173/107  Pulse: 88 87 87 88  Resp: (!) 30 (!) 25 (!) 28 (!) 30  Temp:      TempSrc:      SpO2: 94% 97% 98% 93%  Weight:      Height:         Physical Exam: Blood pressure (!) 173/107, pulse 88, temperature 98.1 F (36.7 C), temperature source Oral, resp. rate (!) 30, height 6' (1.829 m), weight 111.1 kg, SpO2 93 %. Gen: Obese man lying in bed with tachypnea that resolves with speaking, no accessory muscle use.  Initially moving from side to side complaining of back and chest pain which resolved while I was taking his history and examining him. Constitutional: Alert and awake, oriented x3, not in any acute distress. Eyes: sclera anicteric, conjuctiva clear, no lid lag, no exophthalmos, EOMI CVS: S1-S2 clear, no murmur rubs or gallops, patient with significant reproducible tenderness with palpation across his entire chest. Respiratory:  normal effort, symmetrical excursion, CTA without adventitious sounds.  GI: NABS, soft, NT, ND, no palpable masses.  LE: No edema. No cyanosis  Neuro: A/O x 3, Moving all extremities equally with normal strength, CN 3-12 intact,  grossly nonfocal.  Psych: patient is logical and coherent, judgement and insight appear normal, mood and affect appropriate to situation. Skin: no rashes or lesions or ulcers,    Data Review:    Labs: Basic Metabolic Panel: Recent Labs  Lab 09/22/19 0516  NA 137  K 3.7  CL 105  CO2 24  GLUCOSE 159*  BUN 29*  CREATININE 1.41*  CALCIUM 8.7*  MG 1.6*   Liver Function Tests: Recent Labs  Lab 09/22/19 0516  AST 35  ALT 43  ALKPHOS 59  BILITOT 1.6*  PROT 6.6  ALBUMIN 3.5   No results for input(s): LIPASE, AMYLASE in the last 168 hours. No results for input(s): AMMONIA in the last 168 hours. CBC: Recent Labs  Lab 09/22/19 0516  WBC 10.9*  HGB 16.3  HCT 48.2  MCV 79.9*  PLT 186   Cardiac Enzymes: No results for input(s): CKTOTAL, CKMB, CKMBINDEX, TROPONINI in the last 168 hours.  BNP (last 3 results) No results for input(s): PROBNP in the last 8760 hours. CBG: No results for input(s): GLUCAP in the last 168 hours.  Urinalysis    Component Value Date/Time   COLORURINE STRAW (A) 01/09/2018 1659   APPEARANCEUR Clear 07/29/2018 1200   LABSPEC 1.026 01/09/2018 1659   LABSPEC 1.019 11/12/2012 1713   PHURINE 6.0 01/09/2018 1659   GLUCOSEU Negative 07/29/2018 1200   GLUCOSEU Negative 11/12/2012 1713   HGBUR SMALL (A) 01/09/2018 1659   BILIRUBINUR Negative 07/29/2018 1200   BILIRUBINUR Negative 11/12/2012 1713   KETONESUR 5 (A) 01/09/2018 1659   PROTEINUR 2+ (A) 07/29/2018 1200   PROTEINUR 100 (A) 01/09/2018 1659   NITRITE Negative 07/29/2018 1200   NITRITE NEGATIVE 01/09/2018 1659   LEUKOCYTESUR Negative 07/29/2018 1200   LEUKOCYTESUR Negative 11/12/2012 1713      Radiographic Studies: DG Chest Port 1 View  Result Date: 09/22/2019 CLINICAL DATA:  Chest pain EXAM: PORTABLE CHEST 1 VIEW COMPARISON:  09/05/2019 FINDINGS: Chronic cardiomegaly accentuated by low volumes and portable technique. Unremarkable mediastinal contours. Diffuse interstitial  prominence with cephalized blood flow. No effusion or pneumothorax. IMPRESSION: Congestive or bronchitic interstitial opacity, favored congestive. Electronically Signed   By: Monte Fantasia M.D.   On: 09/22/2019 05:37    EKG: Independently reviewed.  Sinus rhythm at 90.  Multiple PVCs ? Fusion beats.  P mitrale.  Nonspecific ICVD.  QTc 490.  He may have minimally biphasic T waves in 3 and F.   Assessment/Plan:   Principal Problem:   Atypical chest pain Active Problems:   COPD (chronic obstructive pulmonary disease) ([facility])   Elevated troponin   NSVT (nonsustained ventricular tachycardia) ([facility])   NICM (nonischemic cardiomyopathy) ([facility])   Coronary artery disease, non-occlusive   Noncompliance with medications   Chest wall pain, chronic   CKD (chronic kidney disease), stage IIIa   Type II diabetes mellitus with renal manifestations ([facility])   Hypertensive urgency   49 year old man with HTN, DM, nonischemic CM, nonobstructive CAD and chronic chest pain presents with atypical sounding chest pain that is reproducible with palpation.  Pain has been nonresponsive to NTG and morphine.  ATYPICAL CHEST PAIN I do not think this is cardiac chest pain, however given risk factors will complete rule out. Patient received aspirin 325 in the ED and started on heparin. Will discontinue heparin as I do not think he is having acute coronary syndrome. Can treat chest pain with Voltaren gel and  standing Tylenol. Warm compresses may also be helpful as well as he clearly has some significant muscle tenderness. He is exhibiting some narcotic seeking behavior so would avoid narcotics.  HYPERTENSIVE URGENCY Patient has been off his medications for the past week. Will restart clonidine, Toprol-XL, loop diuretic and Entresto.  DM2 Restart glargine, decrease usual 24 units dose to 18 units while in house SSI AC at bedtime moderate dose requested Discontinue Metformin, would consider not restarting at discharge  as patient is not taking it due to side effects.  NONISCHEMIC CARDIOMYOPATHY Patient does have some vascular congestion on chest x-ray and he has been off of his medications for a week.  Will restart Entresto and carvedilol.  He is on torsemide 60 mg at home but apparently only takes 40 mg, but has not taken any for the past week. Will start Lasix 40 mg IV twice daily x 2 and reassess in the morning.   HYPOMAGNESEMIA Replete with 2 grams and recheck in am  CKD Avoid NSAIDs, Voltaren gel minimal systemic absorption.   Other information:   DVT prophylaxis: Enoxaparin ordered. Code Status: Full Family Communication: No need to call family per patient Disposition Plan: Home Consults called: None Admission status: Observation  Gabriel [name] Gabriel [name] [facility]  If 7PM-7AM, please contact night-coverage [email or web address] Password [facility] 09/22/2019, 8:52 AM

## 2019-09-23 DIAGNOSIS — R0789 Other chest pain: Secondary | ICD-10-CM

## 2019-09-23 LAB — CBC
HCT: 49.9 % (ref 39.0–52.0)
Hemoglobin: 16.3 g/dL (ref 13.0–17.0)
MCH: 26.4 pg (ref 26.0–34.0)
MCHC: 32.7 g/dL (ref 30.0–36.0)
MCV: 80.9 fL (ref 80.0–100.0)
Platelets: 211 10*3/uL (ref 150–400)
RBC: 6.17 MIL/uL — ABNORMAL HIGH (ref 4.22–5.81)
RDW: 15.5 % (ref 11.5–15.5)
WBC: 9.6 10*3/uL (ref 4.0–10.5)
nRBC: 0 % (ref 0.0–0.2)

## 2019-09-23 LAB — MAGNESIUM: Magnesium: 1.6 mg/dL — ABNORMAL LOW (ref 1.7–2.4)

## 2019-09-23 LAB — BASIC METABOLIC PANEL
Anion gap: 9 (ref 5–15)
BUN: 26 mg/dL — ABNORMAL HIGH (ref 6–20)
CO2: 25 mmol/L (ref 22–32)
Calcium: 8.4 mg/dL — ABNORMAL LOW (ref 8.9–10.3)
Chloride: 103 mmol/L (ref 98–111)
Creatinine, Ser: 1.21 mg/dL (ref 0.61–1.24)
GFR calc Af Amer: >60 mL/min (ref >60)
GFR calc non Af Amer: >60 mL/min (ref >60)
Glucose, Bld: 178 mg/dL — ABNORMAL HIGH (ref 70–99)
Potassium: 3.8 mmol/L (ref 3.5–5.1)
Sodium: 137 mmol/L (ref 135–145)

## 2019-09-23 LAB — HIV ANTIBODY (ROUTINE TESTING W REFLEX): HIV Screen 4th Generation wRfx: NONREACTIVE

## 2019-09-23 LAB — GLUCOSE, CAPILLARY: Glucose-Capillary: 159 mg/dL — ABNORMAL HIGH (ref 70–99)

## 2019-09-23 MED ORDER — METFORMIN HCL 500 MG PO TABS: ORAL_TABLET | ORAL | 0 refills | Status: DC

## 2019-09-23 MED ORDER — TORSEMIDE 20 MG PO TABS: 60.0000 mg | ORAL_TABLET | Freq: Every day | ORAL | 0 refills | Status: DC

## 2019-09-23 MED ORDER — CLONIDINE HCL 0.1 MG PO TABS: 0.1000 mg | ORAL_TABLET | Freq: Two times a day (BID) | ORAL | 3 refills | Status: DC

## 2019-09-23 MED ORDER — KETOROLAC TROMETHAMINE 15 MG/ML IJ SOLN
15.0000 mg | Freq: Three times a day (TID) | INTRAMUSCULAR | Status: DC | PRN
  Administered 2019-09-23: 15 mg via INTRAVENOUS
  Filled 2019-09-23: qty 1

## 2019-09-23 MED ORDER — ACETAMINOPHEN 500 MG PO TABS: 1000.0000 mg | ORAL_TABLET | Freq: Three times a day (TID) | ORAL | Status: DC | PRN

## 2019-09-23 MED ORDER — MAGNESIUM SULFATE 2 GM/50ML IV SOLN
2.0000 g | Freq: Once | INTRAVENOUS | Status: AC
  Administered 2019-09-23: 2 g via INTRAVENOUS
  Filled 2019-09-23: qty 50

## 2019-09-23 MED ORDER — METOPROLOL SUCCINATE ER 200 MG PO TB24: 200.0000 mg | ORAL_TABLET | Freq: Every day | ORAL | 3 refills | Status: DC

## 2019-09-23 MED ORDER — SPIRONOLACTONE 25 MG PO TABS: 25.0000 mg | ORAL_TABLET | Freq: Two times a day (BID) | ORAL | 0 refills | Status: DC

## 2019-09-23 MED ORDER — CYCLOBENZAPRINE HCL 10 MG PO TABS
10.0000 mg | ORAL_TABLET | Freq: Three times a day (TID) | ORAL | Status: DC
  Administered 2019-09-23: 10 mg via ORAL
  Filled 2019-09-23: qty 1

## 2019-09-23 MED ORDER — INSULIN DETEMIR 100 UNIT/ML ~~LOC~~ SOLN: 24.0000 [IU] | Freq: Every day | SUBCUTANEOUS | 0 refills | Status: DC

## 2019-09-23 MED ORDER — CYCLOBENZAPRINE HCL 10 MG PO TABS: 10.0000 mg | ORAL_TABLET | Freq: Three times a day (TID) | ORAL | 0 refills | Status: AC

## 2019-09-23 MED ORDER — SACUBITRIL-VALSARTAN 97-103 MG PO TABS: 1.0000 | ORAL_TABLET | Freq: Two times a day (BID) | ORAL | 1 refills | Status: AC

## 2019-09-23 NOTE — Discharge Summary (Signed)
Physician Discharge Summary   Gabriel [name].  male DOB: [birth year]  [id number]  PCP: Gabriel Graff, FNP  Admit date: 09/22/2019 Discharge date: 09/23/2019  Admitted From: home Disposition:  home CODE STATUS: Full code  Discharge Instructions    Diet - low sodium heart healthy   Complete by: As directed    Discharge instructions   Complete by: As directed    I have spoken to your cardiologist Dr. Rockey [name], and he wants to follow up with you in clinic.  I have prescribed 1 month of all of your blood pressures to [facility] (you also have refills in Medication management clinic).  Your chest pain does not appear to be heart-related, and most likely muscular-tendon-skeletal.  Your kidney function is stable, so you can try Advil 400 mg 3 times a day for 2-3 days to see if it helps with the pain (from strain or inflammation).  If you have chronic pain, you may need your PCP to refer you to pain clinic.   Dr. Enzo [name] - -   Increase activity slowly   Complete by: As directed        Hospital Course:  For full details, please see H&P, progress notes, consult notes and ancillary notes.  Briefly,  Gabriel Christiansen. is an 49 y.o. male with past medical history significant for nonischemic cardiomyopathy, HTN, DM2, chronic chest wall pain, nonobstructive coronary artery disease, CKD 2, noncompliance with medication who presents with chest pain.  The patient was treated with morphine and Nitropaste with minimal effect.  He was then given 3 sublingual nitroglycerin also with minimal effect.  He was noted to have a troponin of 70 and no acute changes on EKG.  Patient was started on a heparin drip and called in for admission for acute coronary syndrome.  ATYPICAL CHEST PAIN, ACS ruled out Trop chronically elevated in double-digits, and trended down to 50's after presentation.   Explained to pt other possible etiologies of chest pain related to MSK.  Pt admitted to recent heavy lifting  and may have caused some muscle strain.  Pt's Cr was stable at 1.21, and GFR >60, so 2-3 days of NSAIDs for chest wall pain and inflammation should be ok.  If pt continues to have chronic pain, then pt was advised to see pain specialist.  Prior to discharge, I had discussed with pt's outpatient cardiologist Dr. Rockey [name] who recommended no further inpatient workup, and will see pt at outpatient followup.  HYPERTENSIVE URGENCY Patient has been off his medications for the past week.  Pt was continued on his home BP regimen and all BP medication were refilled at discharge.  DM2 Pt discharged back to his home diabetic regimen.  NONISCHEMIC CARDIOMYOPATHY Patient does have some vascular congestion on chest x-ray and he has been off of his medications for a week.  Pt had no symptoms of dyspnea and appeared euvolemic.    HYPOMAGNESEMIA Repleted with IV mag.   Discharge Diagnoses:  Principal Problem:   Atypical chest pain Active Problems:   COPD (chronic obstructive pulmonary disease) (HCC)   Elevated troponin   NSVT (nonsustained ventricular tachycardia) (HCC)   NICM (nonischemic cardiomyopathy) (HCC)   Coronary artery disease, non-occlusive   Noncompliance with medications   Chest wall pain, chronic   CKD (chronic kidney disease), stage IIIa   Type II diabetes mellitus with renal manifestations ([name])   Hypertensive urgency    Discharge Instructions:  Allergies as of 09/23/2019  Reactions   Prednisone Other (See Comments)   Reaction: Hallucinations    Zantac [ranitidine Hcl] Diarrhea, Nausea Only   Night sweats      Medication List    TAKE these medications   albuterol 108 (90 Base) MCG/ACT inhaler Commonly known as: VENTOLIN HFA Inhale 2-4 puffs by mouth every 4 hours as needed for wheezing, cough, and/or shortness of breath   aspirin 81 MG chewable tablet Chew 1 tablet (81 mg total) by mouth daily. Reported on 02/08/2016   cloNIDine 0.1 MG tablet Commonly known  as: Catapres Take 1 tablet (0.1 mg total) by mouth 2 (two) times daily.   cyclobenzaprine 10 MG tablet Commonly known as: FLEXERIL Take 1 tablet (10 mg total) by mouth 3 (three) times daily for 10 days.   insulin detemir 100 UNIT/ML injection Commonly known as: Levemir Inject 0.24 mLs (24 Units total) into the skin at bedtime.   metFORMIN 500 MG tablet Commonly known as: GLUCOPHAGE Take 1 tablet (500mg ) with breakfast and 2 tablets (500mg ) at dinner. What changed:   how much to take  how to take this  when to take this  additional instructions   metoprolol 200 MG 24 hr tablet Commonly known as: Toprol XL Take 1 tablet (200 mg total) by mouth daily.   nitroGLYCERIN 0.4 MG SL tablet Commonly known as: NITROSTAT Place 1 tablet (0.4 mg total) under the tongue every 5 (five) minutes x 3 doses as needed for chest pain.   pantoprazole 20 MG tablet Commonly known as: Protonix Take 1 tablet (20 mg total) by mouth at bedtime.   rosuvastatin 20 MG tablet Commonly known as: CRESTOR Take 1 tablet (20 mg total) by mouth daily.   sacubitril-valsartan 97-103 MG Commonly known as: ENTRESTO Take 1 tablet by mouth 2 (two) times daily.   spironolactone 25 MG tablet Commonly known as: ALDACTONE Take 1 tablet (25 mg total) by mouth 2 (two) times daily. What changed: See the new instructions.   torsemide 20 MG tablet Commonly known as: DEMADEX Take 3 tablets (60 mg total) by mouth daily. What changed: how much to take       Follow-up Information    Gabriel Graff, FNP. Go on 09/30/2019.   Specialty: Family Medicine Why: appointment at 10:30am Contact information: [address] [place] [id number] [phone]        Gabriel Merritts, MD. Go on 10/01/2019.   Specialty: Cardiology Why: appointment at 11:30am Contact information: [address] [place] [address] [phone]           Allergies  Allergen Reactions  .  Prednisone Other (See Comments)    Reaction: Hallucinations    . Zantac [Ranitidine Hcl] Diarrhea and Nausea Only    Night sweats     The results of significant diagnostics from this hospitalization (including imaging, microbiology, ancillary and laboratory) are listed below for reference.   Consultations:   Procedures/Studies: DG Chest 2 View  Result Date: 09/05/2019 CLINICAL DATA:  Chest pain. Dizziness. Nausea. Shortness of breath. EXAM: CHEST - 2 VIEW COMPARISON:  Radiographs 07/25/2019 06/25/2019, additional priors. Chest CT 10/01/2017 FINDINGS: Unchanged heart size and mediastinal contours. Interstitial coarsening which appears improved from prior exam and may be chronic. No acute airspace disease, pleural effusion, or pneumothorax. No acute osseous abnormalities. IMPRESSION: Chronic interstitial coarsening.  No acute findings. Electronically Signed   By: Keith Rake M.D.   On: 09/05/2019 00:34   CT Head Wo Contrast  Result Date: 09/05/2019 CLINICAL DATA:  Dizziness.  Nausea. EXAM: CT HEAD WITHOUT CONTRAST TECHNIQUE: Contiguous axial images were obtained from the base of the skull through the vertex without intravenous contrast. COMPARISON:  CT head dated June 30, 2017. FINDINGS: Brain: No evidence of acute infarction, hemorrhage, hydrocephalus, extra-axial collection or mass lesion/mass effect. Mild atrophy is noted. Vascular: No hyperdense vessel or unexpected calcification. Skull: Normal. Negative for fracture or focal lesion. Sinuses/Orbits: No acute finding. Other: None. IMPRESSION: No acute intracranial abnormality. Electronically Signed   By: Constance Holster M.D.   On: 09/05/2019 00:41   DG Chest Port 1 View  Result Date: 09/22/2019 CLINICAL DATA:  Chest pain EXAM: PORTABLE CHEST 1 VIEW COMPARISON:  09/05/2019 FINDINGS: Chronic cardiomegaly accentuated by low volumes and portable technique. Unremarkable mediastinal contours. Diffuse interstitial prominence with  cephalized blood flow. No effusion or pneumothorax. IMPRESSION: Congestive or bronchitic interstitial opacity, favored congestive. Electronically Signed   By: Monte Fantasia M.D.   On: 09/22/2019 05:37      Labs: BNP (last 3 results) Recent Labs    06/25/19 2310 09/22/19 0516  BNP 948.0* [id number]*   Basic Metabolic Panel: No results for input(s): NA, K, CL, CO2, GLUCOSE, BUN, CREATININE, CALCIUM, MG, PHOS in the last 168 hours. Liver Function Tests: No results for input(s): AST, ALT, ALKPHOS, BILITOT, PROT, ALBUMIN in the last 168 hours. No results for input(s): LIPASE, AMYLASE in the last 168 hours. No results for input(s): AMMONIA in the last 168 hours. CBC: No results for input(s): WBC, NEUTROABS, HGB, HCT, MCV, PLT in the last 168 hours. Cardiac Enzymes: No results for input(s): CKTOTAL, CKMB, CKMBINDEX, TROPONINI in the last 168 hours. BNP: Invalid input(s): POCBNP CBG: Recent Labs  Lab 09/27/19 1116  GLUCAP 209*   D-Dimer No results for input(s): DDIMER in the last 72 hours. Hgb A1c No results for input(s): HGBA1C in the last 72 hours. Lipid Profile No results for input(s): CHOL, HDL, LDLCALC, TRIG, CHOLHDL, LDLDIRECT in the last 72 hours. Thyroid function studies No results for input(s): TSH, T4TOTAL, T3FREE, THYROIDAB in the last 72 hours.  Invalid input(s): FREET3 Anemia work up No results for input(s): VITAMINB12, FOLATE, FERRITIN, TIBC, IRON, RETICCTPCT in the last 72 hours. Urinalysis    Component Value Date/Time   COLORURINE STRAW (A) 01/09/2018 1659   APPEARANCEUR Clear 07/29/2018 1200   LABSPEC 1.026 01/09/2018 1659   LABSPEC 1.019 11/12/2012 1713   PHURINE 6.0 01/09/2018 1659   GLUCOSEU Negative 07/29/2018 1200   GLUCOSEU Negative 11/12/2012 1713   HGBUR SMALL (A) 01/09/2018 1659   BILIRUBINUR Negative 07/29/2018 1200   BILIRUBINUR Negative 11/12/2012 1713   KETONESUR 5 (A) 01/09/2018 1659   PROTEINUR 2+ (A) 07/29/2018 1200   PROTEINUR 100 (A)  01/09/2018 1659   NITRITE Negative 07/29/2018 1200   NITRITE NEGATIVE 01/09/2018 1659   LEUKOCYTESUR Negative 07/29/2018 1200   LEUKOCYTESUR Negative 11/12/2012 1713   Sepsis Labs Invalid input(s): PROCALCITONIN,  WBC,  LACTICIDVEN Microbiology Recent Results (from the past 240 hour(s))  SARS CORONAVIRUS 2 (TAT 6-24 HRS) Nasopharyngeal Nasopharyngeal Swab     Status: None   Collection Time: 09/22/19  6:57 AM   Specimen: Nasopharyngeal Swab  Result Value Ref Range Status   SARS Coronavirus 2 NEGATIVE NEGATIVE Final    Comment: (NOTE) SARS-CoV-2 target nucleic acids are NOT DETECTED. The SARS-CoV-2 RNA is generally detectable in upper and lower respiratory specimens during the acute phase of infection. Negative results do not preclude SARS-CoV-2 infection, do not rule out co-infections  with other pathogens, and should not be used as the sole basis for treatment or other patient management decisions. Negative results must be combined with clinical observations, patient history, and epidemiological information. The expected result is Negative. Fact Sheet for Patients: SugarRoll.be Fact Sheet for Healthcare Providers: https://www.woods-mathews.com/ This test is not yet approved or cleared by the [place] FDA and  has been authorized for detection and/or diagnosis of SARS-CoV-2 by FDA under an Emergency Use Authorization (EUA). This EUA will remain  in effect (meaning this test can be used) for the duration of the COVID-19 declaration under Section 56 4(b)(1) of the Act, 21 U.S.C. section 360bbb-3(b)(1), unless the authorization is terminated or revoked sooner. Performed at [facility], [place] [address] [place], [place] [address]      Total time spend on discharging this patient, including the last patient exam, discussing the hospital stay, instructions for ongoing care as it relates to all pertinent caregivers, as well as  preparing the medical discharge records, prescriptions, and/or referrals as applicable, is 40 minutes.    Gabriel Bi, MD  [facility] 09/30/2019, 10:18 PM  If 7PM-7AM, please contact night-coverage

## 2019-09-27 ENCOUNTER — Ambulatory Visit: Payer: Medicaid Other | Attending: Family | Admitting: Family

## 2019-09-27 ENCOUNTER — Other Ambulatory Visit: Payer: Self-pay

## 2019-09-27 ENCOUNTER — Encounter: Payer: Self-pay | Admitting: Family

## 2019-09-27 VITALS — BP 130/80 | HR 63 | Resp 20 | Ht 72.0 in | Wt 250.0 lb

## 2019-09-27 DIAGNOSIS — Z794 Long term (current) use of insulin: Secondary | ICD-10-CM | POA: Diagnosis not present

## 2019-09-27 DIAGNOSIS — I2510 Atherosclerotic heart disease of native coronary artery without angina pectoris: Secondary | ICD-10-CM | POA: Insufficient documentation

## 2019-09-27 DIAGNOSIS — Z87891 Personal history of nicotine dependence: Secondary | ICD-10-CM | POA: Insufficient documentation

## 2019-09-27 DIAGNOSIS — Z8249 Family history of ischemic heart disease and other diseases of the circulatory system: Secondary | ICD-10-CM | POA: Insufficient documentation

## 2019-09-27 DIAGNOSIS — I5022 Chronic systolic (congestive) heart failure: Secondary | ICD-10-CM | POA: Insufficient documentation

## 2019-09-27 DIAGNOSIS — I130 Hypertensive heart and chronic kidney disease with heart failure and stage 1 through stage 4 chronic kidney disease, or unspecified chronic kidney disease: Secondary | ICD-10-CM | POA: Insufficient documentation

## 2019-09-27 DIAGNOSIS — Z79899 Other long term (current) drug therapy: Secondary | ICD-10-CM | POA: Diagnosis not present

## 2019-09-27 DIAGNOSIS — I10 Essential (primary) hypertension: Secondary | ICD-10-CM

## 2019-09-27 DIAGNOSIS — N183 Chronic kidney disease, stage 3 unspecified: Secondary | ICD-10-CM | POA: Insufficient documentation

## 2019-09-27 DIAGNOSIS — J449 Chronic obstructive pulmonary disease, unspecified: Secondary | ICD-10-CM | POA: Diagnosis not present

## 2019-09-27 DIAGNOSIS — Z7982 Long term (current) use of aspirin: Secondary | ICD-10-CM | POA: Insufficient documentation

## 2019-09-27 DIAGNOSIS — E1122 Type 2 diabetes mellitus with diabetic chronic kidney disease: Secondary | ICD-10-CM | POA: Insufficient documentation

## 2019-09-27 DIAGNOSIS — Z888 Allergy status to other drugs, medicaments and biological substances status: Secondary | ICD-10-CM | POA: Insufficient documentation

## 2019-09-27 DIAGNOSIS — Z833 Family history of diabetes mellitus: Secondary | ICD-10-CM | POA: Diagnosis not present

## 2019-09-27 DIAGNOSIS — E119 Type 2 diabetes mellitus without complications: Secondary | ICD-10-CM

## 2019-09-27 LAB — GLUCOSE, CAPILLARY: Glucose-Capillary: 209 mg/dL — ABNORMAL HIGH (ref 70–99)

## 2019-09-27 NOTE — Progress Notes (Signed)
Patient ID: Gabriel Haefs., male    DOB: [birth year], 49 y.o.   MRN: [id number]  HPI  [name] is a 49 y/o male with a history of HTN, diabetes, COPD, CKD, asthma, previous tobacco use and chronic heart failure.   Echo report from 06/26/2019 reviewed and showed an EF of 30-35% along with moderately elevated PA pressure. Echo done 04/29/17 showed EF 45-50%. Echo report that was done 11/28/16 and it showed an EF of 40-45% along with mild MR. EF has improved from 30-35% July 2017.  Admitted 09/22/19 due to chest pain after not getting his medications refilled for 1 week prior to episode. No acute EKG changes and medications resumed. Discharged the following day. Was in the ED 09/05/19 due to dizziness. Glucose >400. IVF's  insulin given. COVID negative and he was released.   He presents today for a follow-up visit with a chief complaint of moderate shortness of breath upon minimal exertion. He describes this as chronic in nature having been present for several years but he says that his breathing has worsened since recent admission. He has associated fatigue (worsening), light-headedness, chest pain, palpitations, difficulty sleeping, chronic pain and nausea/vomiting related to metformin.   He says that he admits that he didn't get his medications refilled ~1 week prior to admission and then resumed everything at previous max doses on 09/23/19. He feels quite fatigued. He also doesn't want to continue metformin as he has vomiting/ nausea every time he takes the medication.    Past Medical History:  Diagnosis Date  . Chest wall pain, chronic   . Chronic systolic CHF (congestive heart failure) ([name])    a. 03/2015 Echo: EF 45-50%; b. 12/2015 Echo: EF 20-25%; c. 02/2016 Echo: EF 30-35%; d. 11/2016 Echo: EF 40-45%; e. 06/2019 Echo: EF 30-35%.  . Chronic Troponin Elevation   . CKD (chronic kidney disease), stage III   . COPD (chronic obstructive pulmonary disease) ([name])   . Diabetes mellitus without  complication ([name])   . Hypertension    Resolved since weight loss  . NICM (nonischemic cardiomyopathy) ([name])    a. 03/2015 Echo: EF 45-50%; b. 04/2015 MV: small defect of mild severity in apex 2/2 apical thinning, EF 30-44%;  c. 12/2015 Echo: EF 20-25%; d. 02/2016 Echo: EF 30-35%; e. 11/2016 Echo: EF 40-45%, diff HK, mildly dil Ao root, mild MR, mod dil LA;  f. 12/2016 Cath: LM nl, LAD/Diags/LCX/OMs min irregs, RCA 40p/m/d; g. 06/2019 Echo: EF 30-35%, glob HK. Mod dil LA. RVSP 58.4 mmHg.  . Non-obstructive CAD (coronary artery disease)    a. 04/2015 low risk MV;  b. 12/2016 Cath: minor irregs in LAD/Diag/LCX/OM, RCA 40p/m/d.  [name] (nonsustained ventricular tachycardia) ([name])    a. 12/2015 noted on tele-->amio;  b. 12/2015 Event monitor: no VT. Atach noted.  . Obesity (BMI 30.0-34.9)   . Psoriasis   . Renal insufficiency   . Syncope    a. 01/2016 - felt to be vasovagal.  . Tobacco abuse    Past Surgical History:  Procedure Laterality Date  . AMPUTATION    . CARDIAC CATHETERIZATION    . FINGER AMPUTATION     Traumatic  . FINGER FRACTURE SURGERY Left   . LEFT HEART CATH AND CORONARY ANGIOGRAPHY N/A 01/06/2017   Procedure: Left Heart Cath and Coronary Angiography;  Surgeon: Wellington Hampshire, MD;  Location: [facility];  Service: Cardiovascular;  Laterality: N/A;   Family History  Problem Relation Age of Onset  .  Diabetes Mellitus II Mother   . Hypertension Father   . Cancer Paternal Aunt   . Cancer Maternal Grandfather   . Diabetes Maternal Grandfather    Social History   Tobacco Use  . Smoking status: Former Smoker    Packs/day: 0.50    Years: 33.00    Pack years: 16.50    Types: Cigarettes    Quit date: 08/18/2019    Years since quitting: 0.1  . Smokeless tobacco: Never Used  . Tobacco comment: Quit a little over a month ago.  Substance Use Topics  . Alcohol use: Yes    Alcohol/week: 0.0 standard drinks    Comment: occassionally   Allergies  Allergen Reactions  .  Prednisone Other (See Comments)    Reaction: Hallucinations    . Zantac [Ranitidine Hcl] Diarrhea and Nausea Only    Night sweats    Prior to Admission medications   Medication Sig Start Date End Date Taking? Authorizing Provider  albuterol (VENTOLIN HFA) 108 (90 Base) MCG/ACT inhaler Inhale 2-4 puffs by mouth every 4 hours as needed for wheezing, cough, and/or shortness of breath 07/25/19  Yes [name], MD  aspirin 81 MG chewable tablet Chew 1 tablet (81 mg total) by mouth daily. Reported on 02/08/2016 08/27/18  Yes [name] A, FNP  cloNIDine (CATAPRES) 0.1 MG tablet Take 1 tablet (0.1 mg total) by mouth 2 (two) times daily. 09/23/19 10/23/19 Yes [name], MD  cyclobenzaprine (FLEXERIL) 10 MG tablet Take 1 tablet (10 mg total) by mouth 3 (three) times daily for 10 days. 09/23/19 10/03/19 Yes [name], MD  insulin detemir (LEVEMIR) 100 UNIT/ML injection Inject 0.24 mLs (24 Units total) into the skin at bedtime. 09/23/19 10/23/19 Yes [name], MD  metFORMIN (GLUCOPHAGE) 500 MG tablet Take 1 tablet (500mg ) with breakfast and 2 tablets (500mg ) at dinner. 09/23/19  Yes [name], MD  metoprolol (TOPROL XL) 200 MG 24 hr tablet Take 1 tablet (200 mg total) by mouth daily. 09/23/19 10/23/19 Yes [name], MD  nitroGLYCERIN (NITROSTAT) 0.4 MG SL tablet Place 1 tablet (0.4 mg total) under the tongue every 5 (five) minutes x 3 doses as needed for chest pain. 06/27/19  Yes [name], MD  pantoprazole (PROTONIX) 20 MG tablet Take 1 tablet (20 mg total) by mouth at bedtime. 09/09/18  Yes Tawni Millers, MD  rosuvastatin (CRESTOR) 20 MG tablet Take 1 tablet (20 mg total) by mouth daily. 08/27/18  Yes Dwaine Pringle A, FNP  sacubitril-valsartan (ENTRESTO) 97-103 MG Take 1 tablet by mouth 2 (two) times daily. 09/23/19 10/23/19 Yes [name], MD  spironolactone (ALDACTONE) 25 MG tablet Take 1 tablet (25 mg total) by mouth 2 (two) times daily. 09/23/19 10/23/19 Yes [name], MD  torsemide (DEMADEX) 20 MG tablet Take 3 tablets (60  mg total) by mouth daily. 09/23/19 10/23/19 Yes [name], MD    Review of Systems  Constitutional: Positive for fatigue. Negative for appetite change.  HENT: Positive for congestion. Negative for postnasal drip and sore throat.   Eyes: Negative.   Respiratory: Positive for shortness of breath (easily). Negative for cough and chest tightness.   Cardiovascular: Positive for chest pain ("just the usual") and palpitations. Negative for leg swelling.  Gastrointestinal: Positive for nausea and vomiting (after metformin). Negative for abdominal distention and abdominal pain.  Endocrine: Negative.   Genitourinary: Negative.   Musculoskeletal: Positive for back pain.  Skin: Negative.   Allergic/Immunologic: Negative.   Neurological: Positive for light-headedness and numbness (down both  legs at times). Negative for dizziness and headaches.  Hematological: Negative for adenopathy. Does not bruise/bleed easily.  Psychiatric/Behavioral: Positive for sleep disturbance. Negative for dysphoric mood and suicidal ideas. The patient is not nervous/anxious.    Vitals:   09/27/19 1119 09/27/19 1136  BP: (!) 143/104 130/80  Pulse: 63   Resp: 20   SpO2: 100%   Weight: 250 lb (113.4 kg)   Height: 6' (1.829 m)    Wt Readings from Last 3 Encounters:  09/27/19 250 lb (113.4 kg)  09/23/19 246 lb 9.6 oz (111.9 kg)  09/04/19 242 lb (109.8 kg)   Lab Results  Component Value Date   CREATININE 1.21 09/23/2019   CREATININE 1.41 (H) 09/22/2019   CREATININE 1.20 09/05/2019    Physical Exam  Constitutional: He is oriented to person, place, and time. He appears well-developed and well-nourished.  HENT:  Head: Normocephalic and atraumatic.  Neck: No JVD present.  Cardiovascular: Normal rate and regular rhythm.  Pulmonary/Chest: Effort normal. He has no wheezes. He has no rales. He exhibits no tenderness.  Abdominal: Soft. He exhibits no distension. There is no abdominal tenderness.  Musculoskeletal:         General: No tenderness or edema.     Cervical back: Normal range of motion and neck supple.  Neurological: He is alert and oriented to person, place, and time.  Skin: Skin is warm and dry.  Psychiatric: He has a normal mood and affect. His behavior is normal. Thought content normal.  Nursing note and vitals reviewed.   Assessment & Plan:  1: Chronic heart failure with reduced ejection fraction- - NYHA class II - euvolemic today - weighing daily and reminded to call for an overnight weight gain of >2 pounds or a weekly weight gain of >5 pounds.  - weight up 6 pounds from last visit 4 months ago - not adding salt and he was encouraged to closely follow a 2000mg  sodium diet - saw cardiology Sharolyn Douglas) 07/02/2019 & returns on 10/01/19 - will decrease metoprolol to 100mg  daily; he will break the current tablets in 1/2; fatigue/ shortness of breath may be worse since meds were resumed at max doses after he was off of them for a week - saw EP Caryl Comes) 09/02/19 - BNP from 09/22/19 was 1178.0  2: HTN- - BP initially elevated (143/ 104) and had improved upon recheck with manual cuff (130/80) - saw PCP @ [facility] 04/07/2019 but does not want to return; explained that he could call [facility], [facility] or [facility] for fee based visits - BMP from 09/23/19 reviewed and showed sodium 137, potassium 3.8, creatinine 1.21 and GFR >60  3: Diabetes-   -  A1c 9.0% on 09/22/19 - fasting glucose in clinic today was 209 - says that he doesn't want to take metformin due to nausea/ vomiting after every door; explained that he needed to follow-up with PCP regarding this - followed by [facility] - completed some classes at the [facility]  Medication bottles were reviewed.   Return in 3 months or sooner for any questions/problems before then.

## 2019-09-27 NOTE — Patient Instructions (Addendum)
Continue weighing daily and call for an overnight weight gain of > 2 pounds or a weekly weight gain of >5 pounds.  Decrease metoprolol to 1/2 tablet daily for now

## 2019-09-30 ENCOUNTER — Ambulatory Visit: Payer: Medicaid Other | Admitting: Family

## 2019-10-01 ENCOUNTER — Ambulatory Visit: Payer: Self-pay | Admitting: Family

## 2019-10-08 ENCOUNTER — Ambulatory Visit: Payer: Self-pay | Admitting: Family

## 2019-10-08 NOTE — Progress Notes (Signed)
Office Visit    Patient Name: Gabriel [name]. Date of Encounter: 10/11/2019  Primary Care Provider:  Alisa Graff, FNP Primary Cardiologist:  [name], MD Electrophysiologist:  None   Chief Complaint    Gabriel [name]. is a 49 y.o. male with a hx of HTN, DM2, NICM/HFrEF, COPD, asthma, presents today for hospital follow up.   Past Medical History    Past Medical History:  Diagnosis Date  . Chest wall pain, chronic   . Chronic systolic CHF (congestive heart failure) ([place])    a. 03/2015 Echo: EF 45-50%; b. 12/2015 Echo: EF 20-25%; c. 02/2016 Echo: EF 30-35%; d. 11/2016 Echo: EF 40-45%; e. 06/2019 Echo: EF 30-35%.  . Chronic Troponin Elevation   . CKD (chronic kidney disease), stage III   . COPD (chronic obstructive pulmonary disease) ([place])   . Diabetes mellitus without complication ([place])   . Hypertension    Resolved since weight loss  . NICM (nonischemic cardiomyopathy) ([place])    a. 03/2015 Echo: EF 45-50%; b. 04/2015 MV: small defect of mild severity in apex 2/2 apical thinning, EF 30-44%;  c. 12/2015 Echo: EF 20-25%; d. 02/2016 Echo: EF 30-35%; e. 11/2016 Echo: EF 40-45%, diff HK, mildly dil Ao root, mild MR, mod dil LA;  f. 12/2016 Cath: LM nl, LAD/Diags/LCX/OMs min irregs, RCA 40p/m/d; g. 06/2019 Echo: EF 30-35%, glob HK. Mod dil LA. RVSP 58.4 mmHg.  . Non-obstructive CAD (coronary artery disease)    a. 04/2015 low risk MV;  b. 12/2016 Cath: minor irregs in LAD/Diag/LCX/OM, RCA 40p/m/d.  [name] (nonsustained ventricular tachycardia) ([place])    a. 12/2015 noted on tele-->amio;  b. 12/2015 Event monitor: no VT. Atach noted.  . Obesity (BMI 30.0-34.9)   . Psoriasis   . Renal insufficiency   . Syncope    a. 01/2016 - felt to be vasovagal.  . Tobacco abuse    Past Surgical History:  Procedure Laterality Date  . AMPUTATION    . CARDIAC CATHETERIZATION    . FINGER AMPUTATION     Traumatic  . FINGER FRACTURE SURGERY Left   . LEFT HEART CATH AND CORONARY ANGIOGRAPHY N/A  01/06/2017   Procedure: Left Heart Cath and Coronary Angiography;  Surgeon: Wellington Hampshire, MD;  Location: [facility];  Service: Cardiovascular;  Laterality: N/A;   Allergies  Allergies  Allergen Reactions  . Prednisone Other (See Comments)    Reaction: Hallucinations    . Zantac [Ranitidine Hcl] Diarrhea and Nausea Only    Night sweats   History of Present Illness    Gabriel [name]. is a 49 y.o. male with a hx of HTN, DM2, NICM/HFrEF (EF 30-35% - November 2020), nonobstructive CAD (cath May 2018), syncope (01/2016 felt to be vasovagal), nonsustained VT (telemtry 01/2016), CKDIII, chronic mild troponin elevations, COPD, asthma. He was last seen by [name] on 09/27/19.  Admitted 06/2019 to [facility] with worsening dyspnea. Echo EF 30-35% with diffuse hypokinesis. IV diuresis with weight from 245 lb on admission to 238lb on discharge. QTc prolongation noted in absence of QT prolonging medications.   Seen by Dr. Caryl Comes 09/02/19 with recommendation for  ICD due to depressed LVEF. He preferred to defer until he confirmed obtaining insurance.   Admitted 09/22/2019-09/23/2019 with NSTEMI. Elevated troponin in the setting of volume overload - had been off cardiac medications for about 1 week. Treated with IV diuresis and discharged on previous medications. Seen in HF clinic by [facility] 09/27/19  for follow up. His Toprol was reduced to 100mg  BID as he felt like a "zombie". Tells me this medication change has helped.   Works night shift. Tells me he will eat at home on the weekends when he is off, but eats out during the week. Endorses eating meals at [employer]. Discussed high sodium content of these foods.  Weighing himself daily. Reports stable home weight of 245lbs. He was educated to report weight of 247lb or above. No BP cuff at home, but plans to purchase.   Reports no chest pain, pressure, tightness. No shortness of breath at rest. Dyspnea on exertion is stable. No LE edema. Reports no  orthopnea and occasional episodes of PND. Endorses PND is much improved since recent hospital discharge.   EKGs/Labs/Other Studies Reviewed:   The following studies were reviewed today:  06/26/19  1. Left ventricular ejection fraction, by visual estimation, is 30 to  35%. The left ventricle has moderately decreased function. There is no  left ventricular hypertrophy.   2. Definity contrast agent was given IV to delineate the left ventricular  endocardial borders.   3. The left ventricle demonstrates global hypokinesis.   4. Global right ventricle has normal systolic function.The right  ventricular size is mildly enlarged. No increase in right ventricular wall  thickness.   5. Left atrial size was moderately dilated.   6. Moderately elevated pulmonary artery systolic pressure.   7. The tricuspid regurgitant velocity is 3.10 m/s, and with an assumed  right atrial pressure of 20 mmHg, the estimated right ventricular systolic  pressure is moderately elevated at 58.4 mmHg.   8. Moderately dilated left ventricular internal cavity size.    Recent Labs: 09/22/2019: ALT 43; B Natriuretic Peptide 1,178.0 09/23/2019: BUN 26; Creatinine, Ser 1.21; Hemoglobin 16.3; Magnesium 1.6; Platelets 211; Potassium 3.8; Sodium 137  Recent Lipid Panel    Component Value Date/Time   CHOL CANCELED 09/02/2018 0944   TRIG CANCELED 09/02/2018 0944   HDL CANCELED 09/02/2018 0944   CHOLHDL 6.6 (H) 07/29/2018 1200   CHOLHDL 5.3 01/04/2016 1401   VLDL 14 01/04/2016 1401   LDLCALC Comment 07/29/2018 1200    Home Medications   Current Meds  Medication Sig  . albuterol (VENTOLIN HFA) 108 (90 Base) MCG/ACT inhaler Inhale 2-4 puffs by mouth every 4 hours as needed for wheezing, cough, and/or shortness of breath  . aspirin 81 MG chewable tablet Chew 1 tablet (81 mg total) by mouth daily. Reported on 02/08/2016  . cloNIDine (CATAPRES) 0.1 MG tablet Take 1 tablet (0.1 mg total) by mouth 2 (two) times daily.  . insulin  detemir (LEVEMIR) 100 UNIT/ML injection Inject 0.24 mLs (24 Units total) into the skin at bedtime.  . metoprolol (TOPROL XL) 200 MG 24 hr tablet Take 1 tablet (200 mg total) by mouth daily. (Patient taking differently: Take 100 mg by mouth daily. )  . nitroGLYCERIN (NITROSTAT) 0.4 MG SL tablet Place 1 tablet (0.4 mg total) under the tongue every 5 (five) minutes x 3 doses as needed for chest pain.  . pantoprazole (PROTONIX) 20 MG tablet Take 1 tablet (20 mg total) by mouth at bedtime.  . rosuvastatin (CRESTOR) 20 MG tablet Take 1 tablet (20 mg total) by mouth daily.  . sacubitril-valsartan (ENTRESTO) 97-103 MG Take 1 tablet by mouth 2 (two) times daily.  Marland Kitchen spironolactone (ALDACTONE) 25 MG tablet Take 1 tablet (25 mg total) by mouth 2 (two) times daily.  Marland Kitchen torsemide (DEMADEX) 20 MG tablet Take 3 tablets (60 mg  total) by mouth daily.    Review of Systems    Review of Systems  Constitution: Negative for chills, fever and malaise/fatigue.  Cardiovascular: Positive for dyspnea on exertion and orthopnea. Negative for chest pain, leg swelling, near-syncope, palpitations and syncope.  Respiratory: Negative for cough, shortness of breath and wheezing.   Gastrointestinal: Negative for nausea and vomiting.  Neurological: Negative for dizziness, light-headedness and weakness.   All other systems reviewed and are otherwise negative except as noted above.  Physical Exam    VS:  BP 120/80 (BP Location: Left Arm, Patient Position: Sitting, Cuff Size: Normal)   Pulse 82   Temp (!) 97.3 F (36.3 C)   Ht 6' (1.829 m)   Wt 245 lb (111.1 kg)   SpO2 98%   BMI 33.23 kg/m  , BMI Body mass index is 33.23 kg/m. GEN: Well nourished, overweight, well developed, in no acute distress. HEENT: normal. Neck: Supple, no JVD, carotid bruits, or masses. Cardiac: RRR, no murmurs, rubs, or gallops. No clubbing, cyanosis, edema.  Radials/DP/PT 2+ and equal bilaterally.  Respiratory:  Respirations regular and unlabored,  clear to auscultation bilaterally. GI: Soft, nontender, nondistended, BS + x 4. MS: No deformity or atrophy. Skin: Warm and dry, no rash. Neuro:  Strength and sensation are intact. Psych: Normal affect.  Accessory Clinical Findings    ECG personally reviewed by me today - SR 82 bpm with occasional PVC, rightward axis, possible LA enlargement - no acute changes.  Assessment & Plan    1. HFrEF/NICM - Euvolemic and well compensated on exam. NYHA II. Echo 06/2019 EF 30-35%.   Recommended for ICD in setting of NICM with EF<30% and hx of syncope, but preferred to wait for insurance. Follows with Dr. Caryl Comes. Tells me insurance still 'in process' today.  Call for weight gain 2lb overnight or 5lb in 1 week.   Low sodium diet encouraged. Does not add salt, but eating out at [facility]. He declined cardiac rehab for dietary education.  GDMT includes Toprol 100mg  daily, Entresto 97-103mg  BID, Spironolactone 25mg  BID, Torsemide 60mg  daily.  Medication management - Encouraged him to call if he has difficulty affording medications. May require SW consult in the future. Presently follow with medication management clinic.   2. HTN - BP well controlled. Continue present antihypertensive regimen. Encouraged to purchase BP cuff for home monitoring.   3. PVC - Noted on EKG today. Reports intermittent palpitations which are not bothersome. Likely due to his cardiomyopathy. Continue Toprol 100mg  daily. Previous dose of Toprol 200mg  was associated with fatigue.   4. DM2 -  Follows with PCP. No recent visit, encouraged to follow up or establish with new PCP. He is considering establishing with [facility].   5. Snores - Would benefit from sleep study. Will defer until insurance is obtained.  6. Tobacco abuse - Smoking cessation encouraged. Recommend utilization of [id number].  7. CKD III - Careful titration of antihypertensives and diuretics.   8. QTc prolongation - Evaluated by EP. Per Dr. Olin Pia note,  no intervention at this time. Not on any QTc prolonging drugs.   9. Chronic chest pain/nonobstructive CAD - Reports no recurrent chest pain. Previously negative stress test and catheterization May 2018. Continue aspirin, beta blocker, statin.     Disposition: Follow up in 2 months with [name], NP. Follow up in 5 month(s) with [name] or APP   [name], NP 10/11/2019, 8:20 AM

## 2019-10-11 ENCOUNTER — Ambulatory Visit (INDEPENDENT_AMBULATORY_CARE_PROVIDER_SITE_OTHER): Payer: Self-pay | Admitting: Family

## 2019-10-11 ENCOUNTER — Other Ambulatory Visit: Payer: Self-pay

## 2019-10-11 ENCOUNTER — Encounter: Payer: Self-pay | Admitting: Family

## 2019-10-11 VITALS — BP 120/80 | HR 82 | Temp 97.3°F | Ht 72.0 in | Wt 245.0 lb

## 2019-10-11 DIAGNOSIS — N183 Chronic kidney disease, stage 3 unspecified: Secondary | ICD-10-CM

## 2019-10-11 DIAGNOSIS — I428 Other cardiomyopathies: Secondary | ICD-10-CM

## 2019-10-11 DIAGNOSIS — E119 Type 2 diabetes mellitus without complications: Secondary | ICD-10-CM

## 2019-10-11 DIAGNOSIS — R9431 Abnormal electrocardiogram [ECG] [EKG]: Secondary | ICD-10-CM

## 2019-10-11 DIAGNOSIS — I5022 Chronic systolic (congestive) heart failure: Secondary | ICD-10-CM

## 2019-10-11 DIAGNOSIS — Z794 Long term (current) use of insulin: Secondary | ICD-10-CM

## 2019-10-11 DIAGNOSIS — Z720 Tobacco use: Secondary | ICD-10-CM

## 2019-10-11 DIAGNOSIS — I493 Ventricular premature depolarization: Secondary | ICD-10-CM

## 2019-10-11 MED ORDER — METOPROLOL SUCCINATE ER 200 MG PO TB24: 100.0000 mg | ORAL_TABLET | Freq: Every day | ORAL | Status: DC

## 2019-10-11 NOTE — Patient Instructions (Signed)
Medication Instructions:  No medication changes today.  *If you need a refill on your cardiac medications before your next appointment, please call your pharmacy*  Lab Work: No lab work today.  Testing/Procedures: You had an EKG today. It showed sinus rhythm with some early beats in the bottom chamber of your heart called PVCs. These are not dangerous.  Follow-Up: At [facility], you and your health needs are our priority.  As part of our continuing mission to provide you with exceptional heart care, we have created designated Provider Care Teams.  These Care Teams include your primary Cardiologist (physician) and Advanced Practice Providers (APPs -  Physician Assistants and Nurse Practitioners) who all work together to provide you with the care you need, when you need it.  Your next appointment:   5 month(s)  The format for your next appointment:   In Person  Provider:   [name], MD  Other Instructions  Call our office for a weight gain of 2 pounds overnight. If you gain 5 pounds in five days, call our office. If you weight 247 lbs or more, call our office.   If you have trouble affording your medications, please call us.  The [employer] brand of blood pressure cuff are pretty good. You can bring it to your next office visit to check the accuracy.  Continue low sodium diet.

## 2019-10-12 ENCOUNTER — Telehealth: Payer: Self-pay | Admitting: Pharmacist

## 2019-10-12 NOTE — Telephone Encounter (Signed)
10/12/2019 9:12:26 AM - [name] pending -- Elmer Picker - Tuesday, October 12, 2019 9:11 AM -- I have received the signed [employer] application for Entresto back from the provider, holding for patient to return his portion, mailed to patient 09/14/2019.

## 2019-11-01 ENCOUNTER — Ambulatory Visit: Payer: Medicaid Other | Admitting: Cardiovascular Disease

## 2019-11-02 ENCOUNTER — Ambulatory Visit: Payer: Medicaid Other | Admitting: Family

## 2019-11-22 ENCOUNTER — Encounter: Payer: Self-pay | Admitting: *Deleted

## 2019-11-22 ENCOUNTER — Emergency Department: Payer: Medicaid Other

## 2019-11-22 ENCOUNTER — Inpatient Hospital Stay
Admission: EM | Admit: 2019-11-22 | Discharge: 2019-11-24 | DRG: 291 | Disposition: A | Discharge status: Home / Self Care | Payer: Medicaid Other | Attending: Internal Medicine | Admitting: Internal Medicine

## 2019-11-22 ENCOUNTER — Other Ambulatory Visit: Payer: Self-pay

## 2019-11-22 DIAGNOSIS — E785 Hyperlipidemia, unspecified: Secondary | ICD-10-CM | POA: Diagnosis present

## 2019-11-22 DIAGNOSIS — I509 Heart failure, unspecified: Secondary | ICD-10-CM

## 2019-11-22 DIAGNOSIS — L409 Psoriasis, unspecified: Secondary | ICD-10-CM | POA: Diagnosis present

## 2019-11-22 DIAGNOSIS — Z6832 Body mass index (BMI) 32.0-32.9, adult: Secondary | ICD-10-CM

## 2019-11-22 DIAGNOSIS — E1169 Type 2 diabetes mellitus with other specified complication: Secondary | ICD-10-CM | POA: Diagnosis present

## 2019-11-22 DIAGNOSIS — I5023 Acute on chronic systolic (congestive) heart failure: Secondary | ICD-10-CM | POA: Diagnosis present

## 2019-11-22 DIAGNOSIS — I428 Other cardiomyopathies: Secondary | ICD-10-CM

## 2019-11-22 DIAGNOSIS — Z7982 Long term (current) use of aspirin: Secondary | ICD-10-CM

## 2019-11-22 DIAGNOSIS — E1122 Type 2 diabetes mellitus with diabetic chronic kidney disease: Secondary | ICD-10-CM | POA: Diagnosis present

## 2019-11-22 DIAGNOSIS — R0789 Other chest pain: Secondary | ICD-10-CM | POA: Diagnosis present

## 2019-11-22 DIAGNOSIS — I160 Hypertensive urgency: Secondary | ICD-10-CM | POA: Diagnosis present

## 2019-11-22 DIAGNOSIS — Z9114 Patient's other noncompliance with medication regimen: Secondary | ICD-10-CM

## 2019-11-22 DIAGNOSIS — E669 Obesity, unspecified: Secondary | ICD-10-CM | POA: Diagnosis present

## 2019-11-22 DIAGNOSIS — Z888 Allergy status to other drugs, medicaments and biological substances status: Secondary | ICD-10-CM

## 2019-11-22 DIAGNOSIS — Z8249 Family history of ischemic heart disease and other diseases of the circulatory system: Secondary | ICD-10-CM

## 2019-11-22 DIAGNOSIS — Z89022 Acquired absence of left finger(s): Secondary | ICD-10-CM

## 2019-11-22 DIAGNOSIS — I248 Other forms of acute ischemic heart disease: Secondary | ICD-10-CM | POA: Diagnosis present

## 2019-11-22 DIAGNOSIS — I25118 Atherosclerotic heart disease of native coronary artery with other forms of angina pectoris: Secondary | ICD-10-CM | POA: Diagnosis present

## 2019-11-22 DIAGNOSIS — R7989 Other specified abnormal findings of blood chemistry: Secondary | ICD-10-CM | POA: Diagnosis present

## 2019-11-22 DIAGNOSIS — E1165 Type 2 diabetes mellitus with hyperglycemia: Secondary | ICD-10-CM | POA: Diagnosis present

## 2019-11-22 DIAGNOSIS — Z794 Long term (current) use of insulin: Secondary | ICD-10-CM

## 2019-11-22 DIAGNOSIS — F17210 Nicotine dependence, cigarettes, uncomplicated: Secondary | ICD-10-CM | POA: Diagnosis present

## 2019-11-22 DIAGNOSIS — Z20822 Contact with and (suspected) exposure to covid-19: Secondary | ICD-10-CM | POA: Diagnosis present

## 2019-11-22 DIAGNOSIS — N183 Chronic kidney disease, stage 3 unspecified: Secondary | ICD-10-CM | POA: Diagnosis present

## 2019-11-22 DIAGNOSIS — I130 Hypertensive heart and chronic kidney disease with heart failure and stage 1 through stage 4 chronic kidney disease, or unspecified chronic kidney disease: Secondary | ICD-10-CM | POA: Diagnosis not present

## 2019-11-22 DIAGNOSIS — I5043 Acute on chronic combined systolic (congestive) and diastolic (congestive) heart failure: Secondary | ICD-10-CM | POA: Diagnosis present

## 2019-11-22 DIAGNOSIS — I10 Essential (primary) hypertension: Secondary | ICD-10-CM

## 2019-11-22 DIAGNOSIS — E1129 Type 2 diabetes mellitus with other diabetic kidney complication: Secondary | ICD-10-CM | POA: Diagnosis present

## 2019-11-22 DIAGNOSIS — J449 Chronic obstructive pulmonary disease, unspecified: Secondary | ICD-10-CM | POA: Diagnosis present

## 2019-11-22 DIAGNOSIS — K219 Gastro-esophageal reflux disease without esophagitis: Secondary | ICD-10-CM

## 2019-11-22 DIAGNOSIS — I2510 Atherosclerotic heart disease of native coronary artery without angina pectoris: Secondary | ICD-10-CM | POA: Diagnosis present

## 2019-11-22 DIAGNOSIS — Z79899 Other long term (current) drug therapy: Secondary | ICD-10-CM | POA: Diagnosis not present

## 2019-11-22 DIAGNOSIS — G8929 Other chronic pain: Secondary | ICD-10-CM | POA: Diagnosis present

## 2019-11-22 DIAGNOSIS — Z833 Family history of diabetes mellitus: Secondary | ICD-10-CM

## 2019-11-22 DIAGNOSIS — R778 Other specified abnormalities of plasma proteins: Secondary | ICD-10-CM | POA: Diagnosis present

## 2019-11-22 LAB — CBC
HCT: 53.2 % — ABNORMAL HIGH (ref 39.0–52.0)
Hemoglobin: 17.1 g/dL — ABNORMAL HIGH (ref 13.0–17.0)
MCH: 27.5 pg (ref 26.0–34.0)
MCHC: 32.1 g/dL (ref 30.0–36.0)
MCV: 85.7 fL (ref 80.0–100.0)
Platelets: 180 10*3/uL (ref 150–400)
RBC: 6.21 MIL/uL — ABNORMAL HIGH (ref 4.22–5.81)
RDW: 17.1 % — ABNORMAL HIGH (ref 11.5–15.5)
WBC: 9.7 10*3/uL (ref 4.0–10.5)
nRBC: 0 % (ref 0.0–0.2)

## 2019-11-22 LAB — COMPREHENSIVE METABOLIC PANEL
ALT: 31 U/L (ref 0–44)
AST: 24 U/L (ref 15–41)
Albumin: 3.5 g/dL (ref 3.5–5.0)
Alkaline Phosphatase: 60 U/L (ref 38–126)
Anion gap: 9 (ref 5–15)
BUN: 30 mg/dL — ABNORMAL HIGH (ref 6–20)
CO2: 24 mmol/L (ref 22–32)
Calcium: 9.1 mg/dL (ref 8.9–10.3)
Chloride: 106 mmol/L (ref 98–111)
Creatinine, Ser: 1.24 mg/dL (ref 0.61–1.24)
GFR calc Af Amer: >60 mL/min (ref >60)
GFR calc non Af Amer: >60 mL/min (ref >60)
Glucose, Bld: 256 mg/dL — ABNORMAL HIGH (ref 70–99)
Potassium: 3.7 mmol/L (ref 3.5–5.1)
Sodium: 139 mmol/L (ref 135–145)
Total Bilirubin: 1.5 mg/dL — ABNORMAL HIGH (ref 0.3–1.2)
Total Protein: 6.8 g/dL (ref 6.5–8.1)

## 2019-11-22 LAB — GLUCOSE, CAPILLARY
Glucose-Capillary: 187 mg/dL — ABNORMAL HIGH (ref 70–99)
Glucose-Capillary: 133 mg/dL — ABNORMAL HIGH (ref 70–99)
Glucose-Capillary: 194 mg/dL — ABNORMAL HIGH (ref 70–99)
Glucose-Capillary: 150 mg/dL — ABNORMAL HIGH (ref 70–99)

## 2019-11-22 LAB — BRAIN NATRIURETIC PEPTIDE: B Natriuretic Peptide: 1698 pg/mL — ABNORMAL HIGH (ref 0.0–100.0)

## 2019-11-22 LAB — TROPONIN I (HIGH SENSITIVITY)
Troponin I (High Sensitivity): 97 ng/L — ABNORMAL HIGH (ref <18)
Troponin I (High Sensitivity): 94 ng/L — ABNORMAL HIGH (ref <18)

## 2019-11-22 MED ORDER — METOPROLOL SUCCINATE ER 100 MG PO TB24
100.0000 mg | ORAL_TABLET | Freq: Every day | ORAL | Status: DC
  Administered 2019-11-22 – 2019-11-24 (×3): 100 mg via ORAL
  Filled 2019-11-22: qty 2
  Filled 2019-11-22 (×2): qty 1

## 2019-11-22 MED ORDER — ACETAMINOPHEN 325 MG PO TABS
650.0000 mg | ORAL_TABLET | ORAL | Status: DC | PRN
  Administered 2019-11-22 – 2019-11-23 (×2): 650 mg via ORAL
  Filled 2019-11-22 (×2): qty 2

## 2019-11-22 MED ORDER — SODIUM CHLORIDE 0.9 % IV SOLN: 250.0000 mL | INTRAVENOUS | Status: DC | PRN

## 2019-11-22 MED ORDER — SODIUM CHLORIDE 0.9% FLUSH
3.0000 mL | Freq: Once | INTRAVENOUS | Status: AC
  Administered 2019-11-22: 3 mL via INTRAVENOUS

## 2019-11-22 MED ORDER — FUROSEMIDE 10 MG/ML IJ SOLN
40.0000 mg | Freq: Once | INTRAMUSCULAR | Status: AC
  Administered 2019-11-22: 40 mg via INTRAVENOUS
  Filled 2019-11-22: qty 4

## 2019-11-22 MED ORDER — ROSUVASTATIN CALCIUM 10 MG PO TABS
20.0000 mg | ORAL_TABLET | Freq: Every day | ORAL | Status: DC
  Administered 2019-11-22 – 2019-11-24 (×3): 20 mg via ORAL
  Filled 2019-11-22: qty 1
  Filled 2019-11-22: qty 2
  Filled 2019-11-22: qty 1
  Filled 2019-11-22: qty 2

## 2019-11-22 MED ORDER — ALBUTEROL SULFATE (2.5 MG/3ML) 0.083% IN NEBU: 2.5000 mg | INHALATION_SOLUTION | Freq: Four times a day (QID) | RESPIRATORY_TRACT | Status: DC | PRN

## 2019-11-22 MED ORDER — ALBUTEROL SULFATE (2.5 MG/3ML) 0.083% IN NEBU: 5.0000 mg | INHALATION_SOLUTION | Freq: Once | RESPIRATORY_TRACT | Status: DC

## 2019-11-22 MED ORDER — FUROSEMIDE 10 MG/ML IJ SOLN
60.0000 mg | Freq: Two times a day (BID) | INTRAMUSCULAR | Status: DC
  Administered 2019-11-22 – 2019-11-23 (×2): 60 mg via INTRAVENOUS
  Filled 2019-11-22: qty 8
  Filled 2019-11-22: qty 6
  Filled 2019-11-22: qty 8

## 2019-11-22 MED ORDER — INSULIN ASPART 100 UNIT/ML ~~LOC~~ SOLN
0.0000 [IU] | Freq: Three times a day (TID) | SUBCUTANEOUS | Status: DC
  Administered 2019-11-22: 2 [IU] via SUBCUTANEOUS
  Administered 2019-11-22 (×2): 3 [IU] via SUBCUTANEOUS
  Administered 2019-11-23: 2 [IU] via SUBCUTANEOUS
  Administered 2019-11-23 (×2): 3 [IU] via SUBCUTANEOUS
  Administered 2019-11-24: 2 [IU] via SUBCUTANEOUS
  Filled 2019-11-22 (×7): qty 1

## 2019-11-22 MED ORDER — SPIRONOLACTONE 25 MG PO TABS
25.0000 mg | ORAL_TABLET | Freq: Two times a day (BID) | ORAL | Status: DC
  Administered 2019-11-22 – 2019-11-24 (×5): 25 mg via ORAL
  Filled 2019-11-22 (×6): qty 1

## 2019-11-22 MED ORDER — ONDANSETRON HCL 4 MG/2ML IJ SOLN: 4.0000 mg | Freq: Four times a day (QID) | INTRAMUSCULAR | Status: DC | PRN

## 2019-11-22 MED ORDER — INSULIN ASPART 100 UNIT/ML ~~LOC~~ SOLN: 0.0000 [IU] | Freq: Every day | SUBCUTANEOUS | Status: DC

## 2019-11-22 MED ORDER — NITROGLYCERIN 0.4 MG SL SUBL: 0.4000 mg | SUBLINGUAL_TABLET | SUBLINGUAL | Status: DC | PRN

## 2019-11-22 MED ORDER — SODIUM CHLORIDE 0.9% FLUSH
3.0000 mL | Freq: Two times a day (BID) | INTRAVENOUS | Status: DC
  Administered 2019-11-23 – 2019-11-24 (×3): 3 mL via INTRAVENOUS

## 2019-11-22 MED ORDER — NITROGLYCERIN 2 % TD OINT
0.5000 [in_us] | TOPICAL_OINTMENT | Freq: Once | TRANSDERMAL | Status: AC
  Administered 2019-11-22: 0.5 [in_us] via TOPICAL
  Filled 2019-11-22: qty 1

## 2019-11-22 MED ORDER — SODIUM CHLORIDE 0.9% FLUSH
3.0000 mL | INTRAVENOUS | Status: DC | PRN
  Administered 2019-11-22: 3 mL via INTRAVENOUS

## 2019-11-22 MED ORDER — PANTOPRAZOLE SODIUM 20 MG PO TBEC
20.0000 mg | DELAYED_RELEASE_TABLET | Freq: Every day | ORAL | Status: DC
  Filled 2019-11-22 (×3): qty 1

## 2019-11-22 MED ORDER — ASPIRIN 81 MG PO CHEW
81.0000 mg | CHEWABLE_TABLET | Freq: Every day | ORAL | Status: DC
  Administered 2019-11-22 – 2019-11-24 (×3): 81 mg via ORAL
  Filled 2019-11-22 (×3): qty 1

## 2019-11-22 MED ORDER — ASPIRIN 81 MG PO CHEW
324.0000 mg | CHEWABLE_TABLET | Freq: Once | ORAL | Status: AC
  Administered 2019-11-22: 324 mg via ORAL
  Filled 2019-11-22: qty 4

## 2019-11-22 MED ORDER — ENOXAPARIN SODIUM 40 MG/0.4ML ~~LOC~~ SOLN
40.0000 mg | SUBCUTANEOUS | Status: DC
  Administered 2019-11-22 – 2019-11-24 (×3): 40 mg via SUBCUTANEOUS
  Filled 2019-11-22 (×3): qty 0.4

## 2019-11-22 NOTE — ED Notes (Signed)
assumed car of patient patient awake alert and tolerating cpap well. Patient diarrheas well after medications given by prior nurse. Vss. Denies any pain or discomforts at present time. Awaiting bed status.

## 2019-11-22 NOTE — ED Notes (Signed)
Pt placed on [place] at 2LPM and off BIPAP to test resp status. Will continue to monitor.

## 2019-11-22 NOTE — ED Provider Notes (Signed)
[facility] Emergency Department Provider Note  ____________________________________________   First MD Initiated Contact with Patient 11/22/19 [phone]     (approximate)  I have reviewed the triage vital signs and the nursing notes.   HISTORY  Chief Complaint Shortness of Breath and Chest Pain    HPI Gabriel Fillmore Willie Gribbins. is a 49 y.o. male with below list of previous medical conditions including congestive heart failure last echo 07/09/2019 revealed an EF of 30 to 35% presents to the emergency department secondary to progressive dyspnea x3 days.  Patient also admits to his central chest pressure stating feels like an elephant on my chest".  Patient also admits to orthopnea.  On my arrival to the room the patient tachypneic with a respiratory rate of 33.        Past Medical History:  Diagnosis Date  . Chest wall pain, chronic   . Chronic systolic CHF (congestive heart failure) ([place])    a. 03/2015 Echo: EF 45-50%; b. 12/2015 Echo: EF 20-25%; c. 02/2016 Echo: EF 30-35%; d. 11/2016 Echo: EF 40-45%; e. 06/2019 Echo: EF 30-35%.  . Chronic Troponin Elevation   . CKD (chronic kidney disease), stage III   . COPD (chronic obstructive pulmonary disease) ([place])   . Diabetes mellitus without complication ([place])   . Hypertension    Resolved since weight loss  . NICM (nonischemic cardiomyopathy) ([place])    a. 03/2015 Echo: EF 45-50%; b. 04/2015 MV: small defect of mild severity in apex 2/2 apical thinning, EF 30-44%;  c. 12/2015 Echo: EF 20-25%; d. 02/2016 Echo: EF 30-35%; e. 11/2016 Echo: EF 40-45%, diff HK, mildly dil Ao root, mild MR, mod dil LA;  f. 12/2016 Cath: LM nl, LAD/Diags/LCX/OMs min irregs, RCA 40p/m/d; g. 06/2019 Echo: EF 30-35%, glob HK. Mod dil LA. RVSP 58.4 mmHg.  . Non-obstructive CAD (coronary artery disease)    a. 04/2015 low risk MV;  b. 12/2016 Cath: minor irregs in LAD/Diag/LCX/OM, RCA 40p/m/d.  [name] (nonsustained ventricular tachycardia) ([place])    a. 12/2015  noted on tele-->amio;  b. 12/2015 Event monitor: no VT. Atach noted.  . Obesity (BMI 30.0-34.9)   . Psoriasis   . Renal insufficiency   . Syncope    a. 01/2016 - felt to be vasovagal.  . Tobacco abuse     Patient Active Problem List   Diagnosis Date Noted  . Acute CHF (congestive heart failure) ([place]) 06/26/2019  . CKD (chronic kidney disease), stage IIIa 06/26/2019  . Type II diabetes mellitus with renal manifestations ([place]) 06/26/2019  . Hypertensive urgency 06/26/2019  . Acute on chronic combined systolic and diastolic CHF (congestive heart failure) ([place]) 09/02/2017  . Chest wall pain, chronic 08/24/2017  . Dizziness   . Noncompliance with medications 04/29/2017  . Back pain 03/06/2017  . Tobacco use 12/04/2016  . Gallbladder sludge 12/04/2016  . Coronary artery disease, non-occlusive 03/15/2016  . Atypical chest pain 02/16/2016  . Orthostatic hypotension 01/23/2016  . Hypomagnesemia 01/23/2016  . NICM (nonischemic cardiomyopathy) ([place]) 01/17/2016  . NSVT (nonsustained ventricular tachycardia) ([place])   . Elevated troponin   . Hyperlipidemia 05/17/2015  . COPD (chronic obstructive pulmonary disease) ([place]) 05/17/2015  . HTN (hypertension) 03/31/2015    Past Surgical History:  Procedure Laterality Date  . AMPUTATION    . CARDIAC CATHETERIZATION    . FINGER AMPUTATION     Traumatic  . FINGER FRACTURE SURGERY Left   . LEFT HEART CATH AND CORONARY ANGIOGRAPHY N/A 01/06/2017   Procedure: Left  Heart Cath and Coronary Angiography;  Surgeon: Wellington Hampshire, MD;  Location: [facility];  Service: Cardiovascular;  Laterality: N/A;    Prior to Admission medications   Medication Sig Start Date End Date Taking? Authorizing Provider  albuterol (VENTOLIN HFA) 108 (90 Base) MCG/ACT inhaler Inhale 2-4 puffs by mouth every 4 hours as needed for wheezing, cough, and/or shortness of breath 07/25/19   [name], MD  aspirin 81 MG chewable tablet Chew 1 tablet (81 mg total) by  mouth daily. Reported on 02/08/2016 08/27/18   Alisa Graff, FNP  cloNIDine (CATAPRES) 0.1 MG tablet Take 1 tablet (0.1 mg total) by mouth 2 (two) times daily. 09/23/19 10/23/19  [name], MD  insulin detemir (LEVEMIR) 100 UNIT/ML injection Inject 0.24 mLs (24 Units total) into the skin at bedtime. 09/23/19 10/23/19  [name], MD  metoprolol (TOPROL XL) 200 MG 24 hr tablet Take 0.5 tablets (100 mg total) by mouth daily. 10/11/19 11/10/19  [name], NP  nitroGLYCERIN (NITROSTAT) 0.4 MG SL tablet Place 1 tablet (0.4 mg total) under the tongue every 5 (five) minutes x 3 doses as needed for chest pain. 06/27/19   [name], MD  pantoprazole (PROTONIX) 20 MG tablet Take 1 tablet (20 mg total) by mouth at bedtime. 09/09/18   Tawni Millers, MD  rosuvastatin (CRESTOR) 20 MG tablet Take 1 tablet (20 mg total) by mouth daily. 08/27/18   Alisa Graff, FNP  spironolactone (ALDACTONE) 25 MG tablet Take 1 tablet (25 mg total) by mouth 2 (two) times daily. 09/23/19 10/23/19  [name], MD  torsemide (DEMADEX) 20 MG tablet Take 3 tablets (60 mg total) by mouth daily. 09/23/19 10/23/19  [name], MD    Allergies Prednisone and Zantac [ranitidine hcl]  Family History  Problem Relation Age of Onset  . Diabetes Mellitus II Mother   . Hypertension Father   . Cancer Paternal Aunt   . Cancer Maternal Grandfather   . Diabetes Maternal Grandfather     Social History Social History   Tobacco Use  . Smoking status: Former Smoker    Packs/day: 0.50    Years: 33.00    Pack years: 16.50    Types: Cigarettes    Quit date: 08/18/2019    Years since quitting: 0.2  . Smokeless tobacco: Never Used  . Tobacco comment: Quit a little over a month ago.  Substance Use Topics  . Alcohol use: Yes    Alcohol/week: 0.0 standard drinks    Comment: occassionally  . Drug use: No    Review of Systems Constitutional: No fever/chills Eyes: No visual changes. ENT: No sore throat. Cardiovascular: Denies chest  pain. Respiratory: Denies shortness of breath. Gastrointestinal: No abdominal pain.  No nausea, no vomiting.  No diarrhea.  No constipation. Genitourinary: Negative for dysuria. Musculoskeletal: Negative for neck pain.  Negative for back pain. Integumentary: Negative for rash. Neurological: Negative for headaches, focal weakness or numbness.   ____________________________________________   PHYSICAL EXAM:  VITAL SIGNS: ED Triage Vitals  Enc Vitals Group     BP 11/22/19 0323 (!) 190/109     Pulse Rate 11/22/19 0323 95     Resp 11/22/19 0323 (!) 32     Temp 11/22/19 0323 97.6 F (36.4 C)     Temp Source 11/22/19 0323 Oral     SpO2 11/22/19 0323 95 %     Weight 11/22/19 0334 111.1 kg (245 lb)     Height 11/22/19 0334 1.829 m (6')  Head Circumference --      Peak Flow --      Pain Score 11/22/19 0333 8     Pain Loc --      Pain Edu? --      Excl. in [place]? --     Constitutional: Alert and oriented.  Apparent respiratory distress Eyes: Conjunctivae are normal.  Mouth/Throat: Patient is wearing a mask. Neck: No stridor.  No meningeal signs.   Cardiovascular: Normal rate, regular rhythm. Good peripheral circulation. Grossly normal heart sounds. Respiratory: Tachypnea, positive accessory respiratory muscle use, bibasilar rales/rhonchi Gastrointestinal: Soft and nontender. No distention.  Musculoskeletal: No lower extremity tenderness nor edema. No gross deformities of extremities. Neurologic:  Normal speech and language. No gross focal neurologic deficits are appreciated.  Skin:  Skin is warm, dry and intact. Psychiatric: Mood and affect are normal. Speech and behavior are normal.  ____________________________________________   LABS (all labs ordered are listed, but only abnormal results are displayed)  Labs Reviewed  CBC - Abnormal; Notable for the following components:      Result Value   RBC 6.21 (*)    Hemoglobin 17.1 (*)    HCT 53.2 (*)    RDW 17.1 (*)    All  other components within normal limits  COMPREHENSIVE METABOLIC PANEL - Abnormal; Notable for the following components:   Glucose, Bld 256 (*)    BUN 30 (*)    Total Bilirubin 1.5 (*)    All other components within normal limits  TROPONIN I (HIGH SENSITIVITY) - Abnormal; Notable for the following components:   Troponin I (High Sensitivity) 97 (*)    All other components within normal limits  BRAIN NATRIURETIC PEPTIDE  TROPONIN I (HIGH SENSITIVITY)   ____________________________________________  EKG  ED ECG REPORT I, [place] N Krishang [place], the attending physician, personally viewed and interpreted this ECG.   Date: 11/22/2019  EKG Time: 3:25 AM  Rate: 92  Rhythm: Sinus rhythm with premature ventricular contractions  Axis: Normal  Intervals: Normal  ST&T Change: None  ____________________________________________  RADIOLOGY I, Coral N Cesia Orf, personally viewed and evaluated these images (plain radiographs) as part of my medical decision making, as well as reviewing the written report by the radiologist.  ED MD interpretation: Cardiomegaly with vascular congestion noted on chest x-ray per radiologist  Official radiology report(s): DG Chest 2 View  Result Date: 11/22/2019 CLINICAL DATA:  Chest pain.  Shortness of breath. EXAM: CHEST - 2 VIEW COMPARISON:  09/22/2019 FINDINGS: Heart size is enlarged. Aortic calcifications are noted. There are prominent interstitial lung markings. There is no pneumothorax. There may be trace bilateral pleural effusions. There is no focal infiltrate. There is no acute osseous abnormality. IMPRESSION: Cardiomegaly with vascular congestion. Electronically Signed   By: Constance Holster M.D.   On: 11/22/2019 04:00    ____________________________________________   PROCEDURES    .Critical Care Performed by: Gregor Hams, MD Authorized by: Gregor Hams, MD   Critical care provider statement:    Critical care time (minutes):  30   Critical  care time was exclusive of:  Separately billable procedures and treating other patients   Critical care was necessary to treat or prevent imminent or life-threatening deterioration of the following conditions:  Respiratory failure   Critical care was time spent personally by me on the following activities:  Development of treatment plan with patient or surrogate, discussions with consultants, evaluation of patient's response to treatment, examination of patient, obtaining history from patient or surrogate, ordering and performing  treatments and interventions, ordering and review of laboratory studies, ordering and review of radiographic studies, pulse oximetry, re-evaluation of patient's condition and review of old charts     ____________________________________________   INITIAL IMPRESSION / MDM / [place] / ED COURSE  As part of my medical decision making, I reviewed the following data within the electronic MEDICAL RECORD NUMBER   49 year old male presenting with above-stated history and physical exam differential diagnosis including but not limited to CHF exacerbation, ACS.  Patient's laboratory data revealed an elevated B NP of 1698, troponin also elevated at 97.  Chest x-ray findings consistent with CHF exacerbation.  Patient given Lasix 40 mg IV with subsequent 700 mL of urine produced.  Given patient's work of breathing and concern for possible impending respiratory failure BiPAP applied.  1/2 inch Nitropaste applied to the patient's chest with improvement in blood pressure.  Patient discussed with Dr. Damita Dunnings for hospital admission for further evaluation and management.  ____________________________________________  FINAL CLINICAL IMPRESSION(S) / ED DIAGNOSES  Final diagnoses:  Acute on chronic combined systolic and diastolic congestive heart failure ([place])     MEDICATIONS GIVEN DURING THIS VISIT:  Medications  sodium chloride flush (NS) 0.9 % injection 3 mL (has no  administration in time range)  furosemide (LASIX) injection 40 mg (40 mg Intravenous Given 11/22/19 0549)  nitroGLYCERIN (NITROGLYN) 2 % ointment 0.5 inch (0.5 inches Topical Given 11/22/19 0549)  aspirin chewable tablet 324 mg (324 mg Oral Given 11/22/19 0550)     ED Discharge Orders    None      *Please note:  Gabriel Noble. was evaluated in Emergency Department on 11/22/2019 for the symptoms described in the history of present illness. He was evaluated in the context of the global COVID-19 pandemic, which necessitated consideration that the patient might be at risk for infection with the SARS-CoV-2 virus that causes COVID-19. Institutional protocols and algorithms that pertain to the evaluation of patients at risk for COVID-19 are in a state of rapid change based on information released by regulatory bodies including the CDC and federal and state organizations. These policies and algorithms were followed during the patient's care in the ED.  Some ED evaluations and interventions may be delayed as a result of limited staffing during the pandemic.*  Note:  This document was prepared using Dragon voice recognition software and may include unintentional dictation errors.   Gregor Hams, MD 11/22/19 [phone]

## 2019-11-22 NOTE — ED Triage Notes (Signed)
Pt does not have insurance or a PCP, pt is out of most of his usual medications.

## 2019-11-22 NOTE — ED Notes (Signed)
Pt tolerating bipap well.  °

## 2019-11-22 NOTE — ED Notes (Signed)
RT here with bipap

## 2019-11-22 NOTE — ED Notes (Signed)
Lunch tray provided. 

## 2019-11-22 NOTE — ED Notes (Addendum)
hospitalist in to see pt. Pt with increased work of breathing, bipap ordered.

## 2019-11-22 NOTE — ED Notes (Signed)
Pt placed in hospital bed for comfort.

## 2019-11-22 NOTE — ED Notes (Signed)
Report to jeanette, rn.  

## 2019-11-22 NOTE — ED Notes (Signed)
Pt not on BIPAP at this time. Pt has not experienced difficulty breathing since transitioned to [place] at [facility]

## 2019-11-22 NOTE — ED Notes (Signed)
Contacted physician for need for COVID swab. Was informed they would decide if one was needed and let this nurse know.

## 2019-11-22 NOTE — ED Notes (Signed)
Pt resting in bed at this time. Laying on Left side

## 2019-11-22 NOTE — ED Notes (Signed)
Patient requesting somethng to eat. Diet obtained, will give upon arival

## 2019-11-22 NOTE — ED Triage Notes (Signed)
Pt c/o shortness of breath x 3 days ago and chest pain x 1 day ago. Pt woke with worsening ShoB and CP. Pt c/o nausea. Pt states productive cough x 3 days.

## 2019-11-22 NOTE — ED Notes (Signed)
Patient c/o of headache, prn meds given for 8/10 headache will re-assess.

## 2019-11-22 NOTE — H&P (Addendum)
History and Physical    Gabriel [name]. [id number] DOB: [birth year] DOA: 11/22/2019  PCP: Alisa Graff, FNP   Patient coming from: Home  I have personally briefly reviewed patient's old medical records in [place]  Chief Complaint: Shortness of breath  HPI: Gabriel [name]. is a 49 y.o. male with medical history significant  for nonischemic cardiomyopathy, nicotine dependence, chronic systolic heart failure (LVEF 30 - 35%), HTN, DM2, chronic chest wall pain, nonobstructive coronary artery disease, CKD 2, noncompliance with medication who presents to the emergency room for evaluation of shortness of breath that has progressively worsened over the last 3 days.  Shortness of breath is associated with orthopnea as well as chest pressure which patient describes as feeling like an elephant is sitting on his chest. He denies having any diaphoresis, nausea, vomiting or palpitations.  He denies having abdominal pain or any changes in his bowel habits.  He denies having any fever or chills, cough  or urinary symptoms   ED Course: 49 year old male presenting with worsening shortness of breath on his baseline associated with orthopnea and chest pressure .  Patient's laboratory data revealed an elevated B NP of 1698, troponin also elevated at 97.  Chest x-ray findings consistent with CHF exacerbation.  Patient given Lasix 40 mg IV with subsequent 700 mL of urine produced.  Given patient's work of breathing and concern for possible impending respiratory failure BiPAP applied.  1/2 inch Nitropaste applied to the patient's chest with improvement in blood pressure.  Patient will be admitted to the hospital for further evaluation and treatment   Review of Systems: As per HPI otherwise 10 point review of systems negative.    Past Medical History:  Diagnosis Date  . Chest wall pain, chronic   . Chronic systolic CHF (congestive heart failure) ([place])    a. 03/2015 Echo: EF 45-50%; b. 12/2015  Echo: EF 20-25%; c. 02/2016 Echo: EF 30-35%; d. 11/2016 Echo: EF 40-45%; e. 06/2019 Echo: EF 30-35%.  . Chronic Troponin Elevation   . CKD (chronic kidney disease), stage III   . COPD (chronic obstructive pulmonary disease) ([place])   . Diabetes mellitus without complication ([place])   . Hypertension    Resolved since weight loss  . NICM (nonischemic cardiomyopathy) ([place])    a. 03/2015 Echo: EF 45-50%; b. 04/2015 MV: small defect of mild severity in apex 2/2 apical thinning, EF 30-44%;  c. 12/2015 Echo: EF 20-25%; d. 02/2016 Echo: EF 30-35%; e. 11/2016 Echo: EF 40-45%, diff HK, mildly dil Ao root, mild MR, mod dil LA;  f. 12/2016 Cath: LM nl, LAD/Diags/LCX/OMs min irregs, RCA 40p/m/d; g. 06/2019 Echo: EF 30-35%, glob HK. Mod dil LA. RVSP 58.4 mmHg.  . Non-obstructive CAD (coronary artery disease)    a. 04/2015 low risk MV;  b. 12/2016 Cath: minor irregs in LAD/Diag/LCX/OM, RCA 40p/m/d.  [name] (nonsustained ventricular tachycardia) ([place])    a. 12/2015 noted on tele-->amio;  b. 12/2015 Event monitor: no VT. Atach noted.  . Obesity (BMI 30.0-34.9)   . Psoriasis   . Renal insufficiency   . Syncope    a. 01/2016 - felt to be vasovagal.  . Tobacco abuse     Past Surgical History:  Procedure Laterality Date  . AMPUTATION    . CARDIAC CATHETERIZATION    . FINGER AMPUTATION     Traumatic  . FINGER FRACTURE SURGERY Left   . LEFT HEART CATH AND CORONARY ANGIOGRAPHY N/A 01/06/2017   Procedure: Left Heart  Cath and Coronary Angiography;  Surgeon: Wellington Hampshire, MD;  Location: [facility];  Service: Cardiovascular;  Laterality: N/A;     reports that he quit smoking about 3 months ago. His smoking use included cigarettes. He has a 16.50 pack-year smoking history. He has never used smokeless tobacco. He reports current alcohol use. He reports that he does not use drugs.  Allergies  Allergen Reactions  . Metformin And Related Nausea And Vomiting  . Prednisone Other (See Comments)    Reaction:  Hallucinations    . Zantac [Ranitidine Hcl] Diarrhea and Nausea Only    Night sweats    Family History  Problem Relation Age of Onset  . Diabetes Mellitus II Mother   . Hypertension Father   . Cancer Paternal Aunt   . Cancer Maternal Grandfather   . Diabetes Maternal Grandfather      Prior to Admission medications   Medication Sig Start Date End Date Taking? Authorizing Provider  albuterol (VENTOLIN HFA) 108 (90 Base) MCG/ACT inhaler Inhale 2-4 puffs by mouth every 4 hours as needed for wheezing, cough, and/or shortness of breath 07/25/19  Yes [name], MD  aspirin 81 MG chewable tablet Chew 1 tablet (81 mg total) by mouth daily. Reported on 02/08/2016 08/27/18  Yes [name] A, FNP  cloNIDine (CATAPRES) 0.1 MG tablet Take 1 tablet (0.1 mg total) by mouth 2 (two) times daily. 09/23/19 11/22/19 Yes [name], MD  insulin detemir (LEVEMIR) 100 UNIT/ML injection Inject 0.24 mLs (24 Units total) into the skin at bedtime. 09/23/19 11/22/19 Yes [name], MD  metoprolol (TOPROL XL) 200 MG 24 hr tablet Take 0.5 tablets (100 mg total) by mouth daily. 10/11/19 11/22/19 Yes [name], NP  nitroGLYCERIN (NITROSTAT) 0.4 MG SL tablet Place 1 tablet (0.4 mg total) under the tongue every 5 (five) minutes x 3 doses as needed for chest pain. 06/27/19  Yes [name], MD  rosuvastatin (CRESTOR) 20 MG tablet Take 1 tablet (20 mg total) by mouth daily. 08/27/18  Yes [name] A, FNP  spironolactone (ALDACTONE) 25 MG tablet Take 1 tablet (25 mg total) by mouth 2 (two) times daily. 09/23/19 11/22/19 Yes [name], MD  torsemide (DEMADEX) 20 MG tablet Take 3 tablets (60 mg total) by mouth daily. Patient taking differently: Take 20 mg by mouth daily.  09/23/19 11/22/19 Yes [name], MD    Physical Exam: Vitals:   11/22/19 0545 11/22/19 0600 11/22/19 0615 11/22/19 0630  BP:  (!) 168/123  (!) 166/119  Pulse: 82 84 84 81  Resp: (!) 36 (!) 29 (!) 21 (!) 22  Temp:      TempSrc:      SpO2: 99% 99% 97% 97%    Weight:      Height:         Vitals:   11/22/19 0545 11/22/19 0600 11/22/19 0615 11/22/19 0630  BP:  (!) 168/123  (!) 166/119  Pulse: 82 84 84 81  Resp: (!) 36 (!) 29 (!) 21 (!) 22  Temp:      TempSrc:      SpO2: 99% 99% 97% 97%  Weight:      Height:        Constitutional: NAD, alert and oriented x 3. On Bipap Eyes: PERRL, lids and conjunctivae normal ENMT: Mucous membranes are moist.  Neck: normal, supple, no masses, no thyromegaly Respiratory: Rales at the bases, no wheezing, Normal respiratory effort. No accessory muscle use.  Cardiovascular: Regular rate and rhythm,  no murmurs / rubs / gallops. No extremity edema. 2+ pedal pulses. No carotid bruits.  Abdomen: no tenderness, no masses palpated. No hepatosplenomegaly. Bowel sounds positive.  Musculoskeletal: no clubbing / cyanosis. No joint deformity upper and lower extremities.  Skin: no rashes, lesions, ulcers.  Neurologic: No gross focal neurologic deficit. Psychiatric: Normal mood and affect.   Labs on Admission: I have personally reviewed following labs and imaging studies  CBC: Recent Labs  Lab 11/22/19 0343  WBC 9.7  HGB 17.1*  HCT 53.2*  MCV 85.7  PLT 99991111   Basic Metabolic Panel: Recent Labs  Lab 11/22/19 0343  NA 139  K 3.7  CL 106  CO2 24  GLUCOSE 256*  BUN 30*  CREATININE 1.24  CALCIUM 9.1   GFR: Estimated Creatinine Clearance: 91.7 mL/min (by C-G formula based on SCr of 1.24 mg/dL). Liver Function Tests: Recent Labs  Lab 11/22/19 0343  AST 24  ALT 31  ALKPHOS 60  BILITOT 1.5*  PROT 6.8  ALBUMIN 3.5   No results for input(s): LIPASE, AMYLASE in the last 168 hours. No results for input(s): AMMONIA in the last 168 hours. Coagulation Profile: No results for input(s): INR, PROTIME in the last 168 hours. Cardiac Enzymes: No results for input(s): CKTOTAL, CKMB, CKMBINDEX, TROPONINI in the last 168 hours. BNP (last 3 results) No results for input(s): PROBNP in the last 8760  hours. HbA1C: No results for input(s): HGBA1C in the last 72 hours. CBG: No results for input(s): GLUCAP in the last 168 hours. Lipid Profile: No results for input(s): CHOL, HDL, LDLCALC, TRIG, CHOLHDL, LDLDIRECT in the last 72 hours. Thyroid Function Tests: No results for input(s): TSH, T4TOTAL, FREET4, T3FREE, THYROIDAB in the last 72 hours. Anemia Panel: No results for input(s): VITAMINB12, FOLATE, FERRITIN, TIBC, IRON, RETICCTPCT in the last 72 hours. Urine analysis:    Component Value Date/Time   COLORURINE STRAW (A) 01/09/2018 1659   APPEARANCEUR Clear 07/29/2018 1200   LABSPEC 1.026 01/09/2018 1659   LABSPEC 1.019 11/12/2012 1713   PHURINE 6.0 01/09/2018 1659   GLUCOSEU Negative 07/29/2018 1200   GLUCOSEU Negative 11/12/2012 1713   HGBUR SMALL (A) 01/09/2018 1659   BILIRUBINUR Negative 07/29/2018 1200   BILIRUBINUR Negative 11/12/2012 1713   KETONESUR 5 (A) 01/09/2018 1659   PROTEINUR 2+ (A) 07/29/2018 1200   PROTEINUR 100 (A) 01/09/2018 1659   NITRITE Negative 07/29/2018 1200   NITRITE NEGATIVE 01/09/2018 1659   LEUKOCYTESUR Negative 07/29/2018 1200   LEUKOCYTESUR Negative 11/12/2012 1713    Radiological Exams on Admission: DG Chest 2 View  Result Date: 11/22/2019 CLINICAL DATA:  Chest pain.  Shortness of breath. EXAM: CHEST - 2 VIEW COMPARISON:  09/22/2019 FINDINGS: Heart size is enlarged. Aortic calcifications are noted. There are prominent interstitial lung markings. There is no pneumothorax. There may be trace bilateral pleural effusions. There is no focal infiltrate. There is no acute osseous abnormality. IMPRESSION: Cardiomegaly with vascular congestion. Electronically Signed   By: Constance Holster M.D.   On: 11/22/2019 04:00    EKG: Independently reviewed.  Sinus rhythm with PVCs  Assessment/Plan Principal Problem:   Acute on chronic combined systolic and diastolic CHF (congestive heart failure) (HCC) Active Problems:   HTN (hypertension)   Elevated  troponin   NICM (nonischemic cardiomyopathy) (HCC)   Coronary artery disease, non-occlusive   CKD (chronic kidney disease), stage IIIa   Type II diabetes mellitus with renal manifestations (HCC)   CHF exacerbation (H[facility])    Acute on chronic combined systolic and  diastolic dysfunction CHF Patient last known LVEF is 30 - 35% He presents for evaluation of worsening shortness of breath from his baseline associated with orthopnea Chest x-ray showed cardiomegaly with vascular congestion. Blood pressure was also significantly elevated Will place patient on Lasix 60 mg IV every 12 Optimize blood pressure control Continue metoprolol and spironolactone Will add ACE inhibitor Consult cardiology     Hypertensive urgency Optimize blood pressure control Continue metoprolol and spironolactone    Diabetes mellitus with hyperglycemia Maintain consistent carbohydrate diet  Glycemic control with sliding scale coverage   Nicotine dependence Smoking cessation was discussed with patient in detail He states that he has cut back and only smokes 3 cigarettes daily\ He declines a nicotine transdermal patch  DVT prophylaxis: Lovenox Code Status: Full Family Communication: Plan of care was discussed with patient at the bedside.  All questions and concerns have been addressed Disposition Plan: Back to previous home environment Consults called: Cardiology    Aariv Medlock MD [facility]     11/22/2019, 8:17 AM

## 2019-11-22 NOTE — TOC Initial Note (Signed)
Transition of Care (TOC) - Initial/Assessment Note    Patient Details  Name: Gabriel [name]. MRN: [id number] Date of Birth: 07-Feb-1971  Transition of Care [facility] CM/SW Contact:    Anselm Pancoast, RN Phone Number: 11/22/2019, 2:58 PM  Clinical Narrative:                 Spoke with patient at bedside. Patient on CPAP and not able to answer more than yes/no questions. Patient lives with his father although he is not a caregiver. Patient drives and continues to work as able. Patient states he will be going home and will not be going to any type of rehab or facility. States he is possibly open to home health if needed although he does not feel as though it will be needed.   Expected Discharge Plan: Home/Self Care     Patient Goals and CMS Choice Patient states their goals for this hospitalization and ongoing recovery are:: get back home      Expected Discharge Plan and Services Expected Discharge Plan: Home/Self Care       Living arrangements for the past 2 months: Single Family Home                                      Prior Living Arrangements/Services Living arrangements for the past 2 months: Single Family Home Lives with:: Self(Father lives with him-not as a caregiver) Patient language and need for interpreter reviewed:: Yes Do you feel safe going back to the place where you live?: Yes      Need for Family Participation in Patient Care: No (Comment) Care giver support system in place?: Yes (comment)   Criminal Activity/Legal Involvement Pertinent to Current Situation/Hospitalization: No - Comment as needed  Activities of Daily Living      Permission Sought/Granted                  Emotional Assessment Appearance:: Appears stated age Attitude/Demeanor/Rapport: Engaged Affect (typically observed): Accepting Orientation: : Oriented to Self, Oriented to Place, Oriented to  Time, Oriented to Situation   Psych Involvement: No (comment)  Admission  diagnosis:  CHF exacerbation ([place]) [I50.9] Patient Active Problem List   Diagnosis Date Noted  . CHF exacerbation ([place]) 11/22/2019  . Acute CHF (congestive heart failure) ([place]) 06/26/2019  . CKD (chronic kidney disease), stage IIIa 06/26/2019  . Type II diabetes mellitus with renal manifestations ([place]) 06/26/2019  . Hypertensive urgency 06/26/2019  . Acute on chronic combined systolic and diastolic CHF (congestive heart failure) ([place]) 09/02/2017  . Chest wall pain, chronic 08/24/2017  . Dizziness   . Noncompliance with medications 04/29/2017  . Back pain 03/06/2017  . Tobacco use 12/04/2016  . Gallbladder sludge 12/04/2016  . Coronary artery disease, non-occlusive 03/15/2016  . Atypical chest pain 02/16/2016  . Orthostatic hypotension 01/23/2016  . Hypomagnesemia 01/23/2016  . NICM (nonischemic cardiomyopathy) ([place]) 01/17/2016  . NSVT (nonsustained ventricular tachycardia) ([place])   . Elevated troponin   . Hyperlipidemia 05/17/2015  . COPD (chronic obstructive pulmonary disease) ([place]) 05/17/2015   PCP:  Alisa Graff, FNP Pharmacy:   [id number] - [place], [place] - [address] [place] [address] Phone: [phone] Fax: [phone]     Social Determinants of Health (SDOH) Interventions    Readmission Risk Interventions No flowsheet data found.

## 2019-11-23 ENCOUNTER — Inpatient Hospital Stay (HOSPITAL_COMMUNITY)
Admit: 2019-11-23 | Discharge: 2019-11-23 | Disposition: A | Discharge status: Home / Self Care | Payer: Medicaid Other | Attending: Internal Medicine | Admitting: Internal Medicine

## 2019-11-23 DIAGNOSIS — R778 Other specified abnormalities of plasma proteins: Secondary | ICD-10-CM

## 2019-11-23 DIAGNOSIS — I2510 Atherosclerotic heart disease of native coronary artery without angina pectoris: Secondary | ICD-10-CM

## 2019-11-23 DIAGNOSIS — I5021 Acute systolic (congestive) heart failure: Secondary | ICD-10-CM

## 2019-11-23 DIAGNOSIS — N1831 Chronic kidney disease, stage 3a: Secondary | ICD-10-CM

## 2019-11-23 DIAGNOSIS — I472 Ventricular tachycardia: Secondary | ICD-10-CM

## 2019-11-23 DIAGNOSIS — E785 Hyperlipidemia, unspecified: Secondary | ICD-10-CM

## 2019-11-23 DIAGNOSIS — I5023 Acute on chronic systolic (congestive) heart failure: Secondary | ICD-10-CM

## 2019-11-23 LAB — ECHOCARDIOGRAM COMPLETE
Height: 72 in
Weight: 3980.8 oz

## 2019-11-23 LAB — HEMOGLOBIN A1C
Hgb A1c MFr Bld: 8.7 % — ABNORMAL HIGH (ref 4.8–5.6)
Mean Plasma Glucose: 202.99 mg/dL

## 2019-11-23 LAB — BASIC METABOLIC PANEL
Anion gap: 11 (ref 5–15)
BUN: 31 mg/dL — ABNORMAL HIGH (ref 6–20)
CO2: 30 mmol/L (ref 22–32)
Calcium: 8.9 mg/dL (ref 8.9–10.3)
Chloride: 101 mmol/L (ref 98–111)
Creatinine, Ser: 1.27 mg/dL — ABNORMAL HIGH (ref 0.61–1.24)
GFR calc Af Amer: >60 mL/min (ref >60)
GFR calc non Af Amer: >60 mL/min (ref >60)
Glucose, Bld: 178 mg/dL — ABNORMAL HIGH (ref 70–99)
Potassium: 4 mmol/L (ref 3.5–5.1)
Sodium: 142 mmol/L (ref 135–145)

## 2019-11-23 LAB — MAGNESIUM: Magnesium: 1.8 mg/dL (ref 1.7–2.4)

## 2019-11-23 LAB — LIPID PANEL
Cholesterol: 156 mg/dL (ref 0–200)
HDL: 21 mg/dL — ABNORMAL LOW (ref >40)
LDL Cholesterol: 96 mg/dL (ref 0–99)
Total CHOL/HDL Ratio: 7.4 RATIO
Triglycerides: 196 mg/dL — ABNORMAL HIGH (ref <150)
VLDL: 39 mg/dL (ref 0–40)

## 2019-11-23 LAB — GLUCOSE, CAPILLARY
Glucose-Capillary: 144 mg/dL — ABNORMAL HIGH (ref 70–99)
Glucose-Capillary: 153 mg/dL — ABNORMAL HIGH (ref 70–99)
Glucose-Capillary: 166 mg/dL — ABNORMAL HIGH (ref 70–99)
Glucose-Capillary: 179 mg/dL — ABNORMAL HIGH (ref 70–99)

## 2019-11-23 LAB — SARS CORONAVIRUS 2 (TAT 6-24 HRS): SARS Coronavirus 2: NEGATIVE

## 2019-11-23 MED ORDER — INSULIN DETEMIR 100 UNIT/ML ~~LOC~~ SOLN
24.0000 [IU] | Freq: Every day | SUBCUTANEOUS | Status: DC
  Administered 2019-11-23: 24 [IU] via SUBCUTANEOUS
  Filled 2019-11-23 (×2): qty 0.24

## 2019-11-23 MED ORDER — ENSURE MAX PROTEIN PO LIQD
11.0000 [oz_av] | Freq: Two times a day (BID) | ORAL | Status: DC
  Administered 2019-11-23: 11 [oz_av] via ORAL
  Filled 2019-11-23: qty 330

## 2019-11-23 MED ORDER — FUROSEMIDE 10 MG/ML IJ SOLN
40.0000 mg | Freq: Two times a day (BID) | INTRAMUSCULAR | Status: DC
  Administered 2019-11-23 – 2019-11-24 (×2): 40 mg via INTRAVENOUS
  Filled 2019-11-23 (×2): qty 4

## 2019-11-23 MED ORDER — SACUBITRIL-VALSARTAN 97-103 MG PO TABS
1.0000 | ORAL_TABLET | Freq: Two times a day (BID) | ORAL | Status: DC
  Administered 2019-11-23 – 2019-11-24 (×3): 1 via ORAL
  Filled 2019-11-23 (×4): qty 1

## 2019-11-23 MED ORDER — PERFLUTREN LIPID MICROSPHERE
1.0000 mL | INTRAVENOUS | Status: AC | PRN
  Administered 2019-11-23: 3 mL via INTRAVENOUS
  Filled 2019-11-23: qty 10

## 2019-11-23 NOTE — Plan of Care (Signed)
  Problem: Activity: Goal: Capacity to carry out activities will improve Outcome: Progressing   Problem: Clinical Measurements: Goal: Respiratory complications will improve Outcome: Progressing   

## 2019-11-23 NOTE — Consult Note (Signed)
Cardiology Consultation:   Patient ID: Gabriel [name]. MRN: [id number]; DOB: October 01, 1970  Admit date: 11/22/2019 Date of Consult: 11/23/2019  Primary Care Provider: Alisa Graff, FNP Primary Cardiologist: [name], MD  Primary Electrophysiologist:  Jolyn Nap, MD   Patient Profile:   Gabriel [name]. is a 49 y.o. male with a hx of chronic systolic heart failure due to nonischemic cardiomyopathy, hypertension, hyperlipidemia, diabetes mellitus lung disease, who is being seen today for the evaluation of shortness of breath at the request of Dr. Posey Pronto.  History of Present Illness:   [name] reports 3-day history of worsening shortness of breath culminating in severe dyspnea leading to ED visit yesterday.  He reports being compliant with his medications as well as a low-sodium diet.  However, on further questioning, he reports that he sometimes will self titrate his diuretic regimen if he feels like he is dehydrated with cramping and "knots" developing in his legs.  He reports being on torsemide 40 mg twice daily, though 60 mg daily as currently prescribed dose in epic.  He notes that his weight at home has been within a 5 pound range.  However, his lowest weight in the last few weeks at home has been 239 pounds (he was 248 pounds on admission).  Mr. Schueneman has chronic stabbing and tightness in his chest that has been present for years and is unchanged from baseline.  He denies palpitations, lightheadedness, and edema in his legs, though his abdomen has felt somewhat distended at times.  He has chronic orthopnea, lying at about 30 degrees.  In the ED, he required BiPAP due to significant respiratory distress.  He was also given IV furosemide with brisk diuresis.   Past Medical History:  Diagnosis Date  . Chest wall pain, chronic   . Chronic systolic CHF (congestive heart failure) ([place])    a. 03/2015 Echo: EF 45-50%; b. 12/2015 Echo: EF 20-25%; c. 02/2016 Echo: EF 30-35%; d. 11/2016  Echo: EF 40-45%; e. 06/2019 Echo: EF 30-35%.  . Chronic Troponin Elevation   . CKD (chronic kidney disease), stage III   . COPD (chronic obstructive pulmonary disease) ([place])   . Diabetes mellitus without complication ([place])   . Hypertension    Resolved since weight loss  . NICM (nonischemic cardiomyopathy) ([name])    a. 03/2015 Echo: EF 45-50%; b. 04/2015 MV: small defect of mild severity in apex 2/2 apical thinning, EF 30-44%;  c. 12/2015 Echo: EF 20-25%; d. 02/2016 Echo: EF 30-35%; e. 11/2016 Echo: EF 40-45%, diff HK, mildly dil Ao root, mild MR, mod dil LA;  f. 12/2016 Cath: LM nl, LAD/Diags/LCX/OMs min irregs, RCA 40p/m/d; g. 06/2019 Echo: EF 30-35%, glob HK. Mod dil LA. RVSP 58.4 mmHg.  . Non-obstructive CAD (coronary artery disease)    a. 04/2015 low risk MV;  b. 12/2016 Cath: minor irregs in LAD/Diag/LCX/OM, RCA 40p/m/d.  [name] (nonsustained ventricular tachycardia) ([place])    a. 12/2015 noted on tele-->amio;  b. 12/2015 Event monitor: no VT. Atach noted.  . Obesity (BMI 30.0-34.9)   . Psoriasis   . Renal insufficiency   . Syncope    a. 01/2016 - felt to be vasovagal.  . Tobacco abuse     Past Surgical History:  Procedure Laterality Date  . AMPUTATION    . CARDIAC CATHETERIZATION    . FINGER AMPUTATION     Traumatic  . FINGER FRACTURE SURGERY Left   . LEFT HEART CATH AND CORONARY ANGIOGRAPHY N/A 01/06/2017  Procedure: Left Heart Cath and Coronary Angiography;  Surgeon: Wellington Hampshire, MD;  Location: [facility];  Service: Cardiovascular;  Laterality: N/A;     Home Medications:  Prior to Admission medications   Medication Sig Start Date Thalia Turkington Date Taking? Authorizing Provider  albuterol (VENTOLIN HFA) 108 (90 Base) MCG/ACT inhaler Inhale 2-4 puffs by mouth every 4 hours as needed for wheezing, cough, and/or shortness of breath 07/25/19  Yes [name], MD  aspirin 81 MG chewable tablet Chew 1 tablet (81 mg total) by mouth daily. Reported on 02/08/2016 08/27/18  Yes [name]  A, FNP  cloNIDine (CATAPRES) 0.1 MG tablet Take 1 tablet (0.1 mg total) by mouth 2 (two) times daily. 09/23/19 11/22/19 Yes [name], MD  insulin detemir (LEVEMIR) 100 UNIT/ML injection Inject 0.24 mLs (24 Units total) into the skin at bedtime. 09/23/19 11/22/19 Yes [name], MD  metoprolol (TOPROL XL) 200 MG 24 hr tablet Take 0.5 tablets (100 mg total) by mouth daily. 10/11/19 11/22/19 Yes [name], NP  nitroGLYCERIN (NITROSTAT) 0.4 MG SL tablet Place 1 tablet (0.4 mg total) under the tongue every 5 (five) minutes x 3 doses as needed for chest pain. 06/27/19  Yes [name], MD  rosuvastatin (CRESTOR) 20 MG tablet Take 1 tablet (20 mg total) by mouth daily. 08/27/18  Yes [name] A, FNP  spironolactone (ALDACTONE) 25 MG tablet Take 1 tablet (25 mg total) by mouth 2 (two) times daily. 09/23/19 11/22/19 Yes [name], MD  torsemide (DEMADEX) 20 MG tablet Take 3 tablets (60 mg total) by mouth daily. Patient taking differently: Take 20 mg by mouth daily.  09/23/19 11/22/19 Yes [name], MD  sacubitril-valsartan (ENTRESTO) 97-103 MG Take 1 tablet by mouth 2 (two) times daily.    [provider]    Inpatient Medications: Scheduled Meds: . aspirin  81 mg Oral Daily  . enoxaparin (LOVENOX) injection  40 mg Subcutaneous Q24H  . furosemide  60 mg Intravenous Q12H  . insulin aspart  0-15 Units Subcutaneous TID WC  . insulin aspart  0-5 Units Subcutaneous QHS  . metoprolol  100 mg Oral Daily  . pantoprazole  20 mg Oral QHS  . rosuvastatin  20 mg Oral Daily  . sodium chloride flush  3 mL Intravenous Q12H  . spironolactone  25 mg Oral BID   Continuous Infusions: . sodium chloride     PRN Meds: sodium chloride, acetaminophen, albuterol, nitroGLYCERIN, ondansetron (ZOFRAN) IV, sodium chloride flush  Allergies:    Allergies  Allergen Reactions  . Metformin And Related Nausea And Vomiting  . Prednisone Other (See Comments)    Reaction: Hallucinations    . Zantac [Ranitidine Hcl] Diarrhea and  Nausea Only    Night sweats    Social History:   Social History   Tobacco Use  . Smoking status: Former Smoker    Packs/day: 0.50    Years: 33.00    Pack years: 16.50    Types: Cigarettes    Quit date: 08/18/2019    Years since quitting: 0.2  . Smokeless tobacco: Never Used  . Tobacco comment: Quit a little over a month ago.  Substance Use Topics  . Alcohol use: Yes    Alcohol/week: 0.0 standard drinks    Comment: occassionally  . Drug use: No     Family History:   Family History  Problem Relation Age of Onset  . Diabetes Mellitus II Mother   . Hypertension Father   . Cancer Paternal Aunt   .  Cancer Maternal Grandfather   . Diabetes Maternal Grandfather      ROS:  Review of Systems  Constitutional: Positive for malaise/fatigue. Negative for chills and fever.  HENT: Negative.   Eyes: Negative.   Respiratory: Positive for cough.   Cardiovascular: Positive for chest pain, orthopnea and PND. Negative for leg swelling.  Gastrointestinal: Negative.   Genitourinary: Negative.   Musculoskeletal: Positive for myalgias (intermittent leg cramps).  Neurological: Negative.   Endo/Heme/Allergies: Negative.   Psychiatric/Behavioral: Negative.     Physical Exam/Data:   Vitals:   11/22/19 2300 11/22/19 2330 11/23/19 0007 11/23/19 0546  BP: 122/82 (!) 132/91 (!) 142/106 124/87  Pulse: 65 68 72 80  Resp: (!) 21 17    Temp:   97.9 F (36.6 C) 98.2 F (36.8 C)  TempSrc:   Oral Oral  SpO2: 99% 95% 100% 99%  Weight:   112.9 kg 112.9 kg  Height:   6' (1.829 m)     Intake/Output Summary (Last 24 hours) at 11/23/2019 1005 Last data filed at 11/23/2019 0915 Gross per 24 hour  Intake 240 ml  Output 3600 ml  Net -3360 ml   Last 3 Weights 11/23/2019 11/23/2019 11/22/2019  Weight (lbs) 248 lb 12.8 oz 248 lb 12.8 oz 245 lb  Weight (kg) 112.855 kg 112.855 kg 111.131 kg     Body mass index is 33.74 kg/m.  General:  Well nourished, well developed, in no acute distress HEENT:  normal Lymph: no adenopathy Neck: JVP approximately 6 to 8 cm with positive HJR. Endocrine:  No thryomegaly Vascular: No carotid bruits; FA pulses 2+ bilaterally without bruits  Cardiac: Regular rate and rhythm without murmurs, rubs, or gallops. Lungs:  clear to auscultation bilaterally, no wheezing, rhonchi or rales  Abd: soft, nontender, no hepatomegaly  Ext: Trace ankle edema. Musculoskeletal:  No deformities, BUE and BLE strength normal and equal Skin: warm and dry  Neuro:  CNs 2-12 intact, no focal abnormalities noted Psych:  Normal affect   EKG:  The EKG was personally reviewed and demonstrates: Normal sinus rhythm with PVCs, right axis deviation, and poor R wave progression. Telemetry:  Telemetry was personally reviewed and demonstrates: Sinus rhythm with frequent PVCs as well as a 7 beat run of nonsustained ventricular tachycardia.  Relevant CV Studies: TTE (06/26/2019): 1. Left ventricular ejection fraction, by visual estimation, is 30 to  35%. The left ventricle has moderately decreased function. There is no  left ventricular hypertrophy.  2. Definity contrast agent was given IV to delineate the left ventricular  endocardial borders.  3. The left ventricle demonstrates global hypokinesis.  4. Global right ventricle has normal systolic function.The right  ventricular size is mildly enlarged. No increase in right ventricular wall  thickness.  5. Left atrial size was moderately dilated.  6. Moderately elevated pulmonary artery systolic pressure.  7. The tricuspid regurgitant velocity is 3.10 m/s, and with an assumed  right atrial pressure of 20 mmHg, the estimated right ventricular systolic  pressure is moderately elevated at 58.4 mmHg.  8. Moderately dilated left ventricular internal cavity size.   Laboratory Data:  High Sensitivity Troponin:   Recent Labs  Lab 11/22/19 0343 11/22/19 0530  TROPONINIHS 97* 94*     Chemistry Recent Labs  Lab 11/22/19 0343  11/23/19 0435  NA 139 142  K 3.7 4.0  CL 106 101  CO2 24 30  GLUCOSE 256* 178*  BUN 30* 31*  CREATININE 1.24 1.27*  CALCIUM 9.1 8.9  GFRNONAA >60 >60  GFRAA >  60 >60  ANIONGAP 9 11    Recent Labs  Lab 11/22/19 0343  PROT 6.8  ALBUMIN 3.5  AST 24  ALT 31  ALKPHOS 60  BILITOT 1.5*   Hematology Recent Labs  Lab 11/22/19 0343  WBC 9.7  RBC 6.21*  HGB 17.1*  HCT 53.2*  MCV 85.7  MCH 27.5  MCHC 32.1  RDW 17.1*  PLT 180   BNP Recent Labs  Lab 11/22/19 0343  BNP 1,698.0*    DDimer No results for input(s): DDIMER in the last 168 hours.   Radiology/Studies:  DG Chest 2 View  Result Date: 11/22/2019 CLINICAL DATA:  Chest pain.  Shortness of breath. EXAM: CHEST - 2 VIEW COMPARISON:  09/22/2019 FINDINGS: Heart size is enlarged. Aortic calcifications are noted. There are prominent interstitial lung markings. There is no pneumothorax. There may be trace bilateral pleural effusions. There is no focal infiltrate. There is no acute osseous abnormality. IMPRESSION: Cardiomegaly with vascular congestion. Electronically Signed   By: Constance Holster M.D.   On: 11/22/2019 04:00   Assessment and Plan:   Acute on chronic systolic heart failure due to nonischemic cardiomyopathy: Mr. Jacobs reports a 3 to 4-day history of worsening shortness of breath associated with a 9 to 10 pound weight gain from his nadir in the last few weeks.  He reports being compliant with his medications but has been self titrating at times.  He also has a history of noncompliance in the past.  I suspect these are the inciting events for his fluid retention.  We spoke at length regarding the importance of taking his medications as prescribed as well as to monitoring his weight.  Small increases such as 2 pounds in a day or 5 pounds in a week likely reflect fluid retention and may require escalation of his diuretic regimen to prevent acute exacerbation.  He has diuresed very briskly since coming to the ER  yesterday, making over 5 L of urine.  He reports feeling better today but not back to baseline.  Decrease furosemide to 40 mg IV twice daily.  Hopefully, we can transition him to torsemide 40 mg twice daily tomorrow.  Continue current doses of metoprolol, Entresto, and spironolactone.  Importance of dietary and medication compliance was stressed.  Patient would likely benefit from referral to the advanced heart failure clinic in [place] to discuss remote monitoring with [facility], given multiple heart failure admissions over the last year.  Chest pain and elevated troponin: Chronic and likely related to supply-demand mismatch in the setting of chronic systolic heart failure.  Prior catheterization in 12/2016 showed mild nonobstructive CAD.  Ongoing management of acute on chronic systolic heart failure, as above.  Nonsustained ventricular tachycardia: Likely related to underlying cardiomyopathy.  Patient previously seen by Dr. Caryl Comes (EP) but deferred ICD implantation at that time.  Follow-up echocardiogram.  If LVEF remains less than 35%, follow-up with Dr. Caryl Comes to readdress defibrillator placement will be important.  Continue current dose of metoprolol succinate, as Mr. Peaslee did not tolerate further escalation in the past.  Check magnesium level.  Hyperlipidemia:  Check fasting lipid panel.  Continue atorvastatin 20 mg daily given history of diabetes mellitus and mild CAD.  Hypertension: Patient hypertensive on arrival but much improved at this time.  Continue current regimen of metoprolol, Entresto, and spironolactone.    For questions or updates, please contact [place] Please consult [email or web address] for contact info under [facility].  Signed, Nelva Bush, MD  11/23/2019 10:05 AM

## 2019-11-23 NOTE — Progress Notes (Signed)
[facility] [place] NAME: Gabriel [name]    MR#:  [id number]  DATE OF BIRTH:  1971-03-17  SUBJECTIVE:  patient came in with increasing shortness of breath. He was on BiPAP for several hours. Good urine output. Feels a lot better. No chest pain. REVIEW OF SYSTEMS:   Review of Systems  Constitutional: Negative for chills, fever and weight loss.  HENT: Negative for ear discharge, ear pain and nosebleeds.   Eyes: Negative for blurred vision, pain and discharge.  Respiratory: Positive for shortness of breath. Negative for sputum production, wheezing and stridor.   Cardiovascular: Negative for chest pain, palpitations, orthopnea and PND.  Gastrointestinal: Negative for abdominal pain, diarrhea, nausea and vomiting.  Genitourinary: Negative for frequency and urgency.  Musculoskeletal: Negative for back pain and joint pain.  Neurological: Negative for sensory change, speech change, focal weakness and weakness.  Psychiatric/Behavioral: Negative for depression and hallucinations. The patient is not nervous/anxious.    Tolerating Diet:yes Tolerating PT: not needed  DRUG ALLERGIES:   Allergies  Allergen Reactions  . Metformin And Related Nausea And Vomiting  . Prednisone Other (See Comments)    Reaction: Hallucinations    . Zantac [Ranitidine Hcl] Diarrhea and Nausea Only    Night sweats    VITALS:  Blood pressure (!) 141/90, pulse 77, temperature 98.7 F (37.1 C), temperature source Oral, resp. rate 17, height 6' (1.829 m), weight 112.9 kg, SpO2 98 %.  PHYSICAL EXAMINATION:   Physical Exam  GENERAL:  49 y.o.-year-old patient lying in the bed with no acute distress. obese EYES: Pupils equal, round, reactive to light and accommodation. No scleral icterus.   HEENT: Head atraumatic, normocephalic. Oropharynx and nasopharynx clear.  NECK:  Supple, no jugular venous distention. No thyroid enlargement, no tenderness.  LUNGS: Normal breath  sounds bilaterally, no wheezing, few bibasilar rales, no rhonchi. No use of accessory muscles of respiration.  CARDIOVASCULAR: S1, S2 normal. No murmurs, rubs, or gallops.  ABDOMEN: Soft, nontender, nondistended. Bowel sounds present. No organomegaly or mass. Abdominal obesity EXTREMITIES: No cyanosis, clubbing or edema b/l.    NEUROLOGIC: Cranial nerves II through XII are intact. No focal Motor or sensory deficits b/l.   PSYCHIATRIC:  patient is alert and oriented x 3.  SKIN: No obvious rash, lesion, or ulcer.   LABORATORY PANEL:  CBC Recent Labs  Lab 11/22/19 0343  WBC 9.7  HGB 17.1*  HCT 53.2*  PLT 180    Chemistries  Recent Labs  Lab 11/22/19 0343 11/22/19 0343 11/23/19 0435  NA 139   < > 142  K 3.7   < > 4.0  CL 106   < > 101  CO2 24   < > 30  GLUCOSE 256*   < > 178*  BUN 30*   < > 31*  CREATININE 1.24   < > 1.27*  CALCIUM 9.1   < > 8.9  MG  --   --  1.8  AST 24  --   --   ALT 31  --   --   ALKPHOS 60  --   --   BILITOT 1.5*  --   --    < > = values in this interval not displayed.   Cardiac Enzymes No results for input(s): TROPONINI in the last 168 hours. RADIOLOGY:  DG Chest 2 View  Result Date: 11/22/2019 CLINICAL DATA:  Chest pain.  Shortness of breath. EXAM: CHEST - 2 VIEW COMPARISON:  09/22/2019 FINDINGS: Heart  size is enlarged. Aortic calcifications are noted. There are prominent interstitial lung markings. There is no pneumothorax. There may be trace bilateral pleural effusions. There is no focal infiltrate. There is no acute osseous abnormality. IMPRESSION: Cardiomegaly with vascular congestion. Electronically Signed   By: Constance Holster M.D.   On: 11/22/2019 04:00   ASSESSMENT AND PLAN:  Gabriel Galicia. is a 49 y.o. male with medical history significant  for nonischemic cardiomyopathy, nicotine dependence, chronic systolic heart failure (LVEF 30 - 35%),HTN, DM2,chronic chest wall pain, nonobstructive coronary artery disease, CKD2, noncompliance  with medication who presents to the emergency room for evaluation of shortness of breath that has progressively worsened over the last 3 days.  Acute on chronic  systolic and CHF (congestive heart failure) (HCC) due to non-ischemic cardiomyopathy -previous 2D echo in 2020 showed30 to 35% -IV lasix 40 mg bid--UOP >8 liters so far--change to po torsemide in am 40 mg bid -wean oxygen slowly to RA -cont current doses of metoprolol, enteresto and spironolactone--per Dr End -pt follows up at [facility] with Otila Kluver Hackney--helps with meds -he is trying to be compliant with his diet -advised smoking cessation  Hypertensive urgency.Bp is 136/94 this AM. -will be on metoprolol, lasix and Entresto  Episodes of NSVT--has been evaluated by Dr Belva Chimes -needs ICD--pt has put it off due to no insurance and financial strain -waiting for echo--if EF <35% then will need ICD placement  Chest pain with elevated troponin I: trop 97--94. -Likely demand ischemia. -CP resolved  Dyslipidemia. - continue statin therapy.  Type II diabetes mellitus with renal manifestations. -continue home meds with lantus 24 units at bedtime  Tobacco use: -nicotine patch -advised cessation  CKD (chronic kidney disease), stage IIIa:stable. -creat 1.27  Suspected OSA with Obesity -Overnite pulse oximetry -pt has not gotten his sleep study yet.  Procedures:none Family communication :none today Consults :[facility] Discharge Disposition :anticipate d/c in 1-2 days back to home. Currently getting IV lasix for CHF, Echo pending, ?ICD CODE STATUS: full DVT Prophylaxis :lovenox   TOTAL TIME TAKING CARE OF THIS PATIENT: 30 minutes.  >50% time spent on counselling and coordination of care  Note: This dictation was prepared with Dragon dictation along with smaller phrase technology. Any transcriptional errors that result from this process are unintentional.  Fritzi Mandes M.D    [facility]    CC: Primary care physician; Alisa Graff, FNPPatient ID: Gabriel Righter., male   DOB: 05-Aug-1971, 49 y.o.   MRN: [id number]

## 2019-11-23 NOTE — Progress Notes (Signed)
*  PRELIMINARY RESULTS* Echocardiogram 2D Echocardiogram has been performed.  Gabriel [name] 11/23/2019, 9:53 AM

## 2019-11-23 NOTE — Plan of Care (Signed)
Nutrition Education Note  RD consulted for nutrition education regarding CHF.  49 y.o. male with medical history significant  for nonischemic cardiomyopathy, nicotine dependence, chronic systolic heart failure (LVEF 30 - 35%), HTN, DM2, chronic chest wall pain, nonobstructive coronary artery disease, CKD 2, noncompliance with medication who presents to the emergency room for evaluation of shortness of breath   RD provided "Low Sodium Nutrition Therapy" handout from the Academy of [employer] and Dietetics. Reviewed patient's dietary recall. Provided examples on ways to decrease sodium intake in diet. Discouraged intake of processed foods and use of salt shaker. Encouraged fresh fruits and vegetables as well as whole grain sources of carbohydrates to maximize fiber intake.   RD discussed why it is important for patient to adhere to diet recommendations, and emphasized the role of fluids, foods to avoid, and importance of weighing self daily. Teach back method used.  Expect poor compliance.  Body mass index is 33.74 kg/m. Pt meets criteria for obesity based on current BMI.  Current diet order is HH/CHO modified, patient is consuming approximately 100% of meals at this time. Labs and medications reviewed.   Pt reports continued hunger after meals. RD will add Ensure Max protein supplement BID, each supplement provides 150kcal and 30g of protein.  No further nutrition interventions warranted at this time. RD contact information provided. If additional nutrition issues arise, please re-consult RD.   Koleen Distance MS, RD, LDN Please refer to [facility] for RD and/or RD on-call/weekend/after hours pager

## 2019-11-24 LAB — BASIC METABOLIC PANEL
Anion gap: 8 (ref 5–15)
BUN: 28 mg/dL — ABNORMAL HIGH (ref 6–20)
CO2: 31 mmol/L (ref 22–32)
Calcium: 8.9 mg/dL (ref 8.9–10.3)
Chloride: 99 mmol/L (ref 98–111)
Creatinine, Ser: 1.21 mg/dL (ref 0.61–1.24)
GFR calc Af Amer: >60 mL/min (ref >60)
GFR calc non Af Amer: >60 mL/min (ref >60)
Glucose, Bld: 130 mg/dL — ABNORMAL HIGH (ref 70–99)
Potassium: 3.5 mmol/L (ref 3.5–5.1)
Sodium: 138 mmol/L (ref 135–145)

## 2019-11-24 LAB — GLUCOSE, CAPILLARY
Glucose-Capillary: 149 mg/dL — ABNORMAL HIGH (ref 70–99)
Glucose-Capillary: 157 mg/dL — ABNORMAL HIGH (ref 70–99)

## 2019-11-24 MED ORDER — PANTOPRAZOLE SODIUM 20 MG PO TBEC: 20.0000 mg | DELAYED_RELEASE_TABLET | Freq: Every day | ORAL | 3 refills | Status: DC

## 2019-11-24 MED ORDER — TORSEMIDE 20 MG PO TABS
40.0000 mg | ORAL_TABLET | Freq: Two times a day (BID) | ORAL | Status: DC
  Administered 2019-11-24: 40 mg via ORAL
  Filled 2019-11-24: qty 2

## 2019-11-24 MED ORDER — TORSEMIDE 20 MG PO TABS: 40.0000 mg | ORAL_TABLET | Freq: Two times a day (BID) | ORAL | 1 refills | Status: DC

## 2019-11-24 NOTE — Progress Notes (Signed)
All medications administered by [facility] Nurse were supervised by this RN Gwenivere Hiraldo A Rush Salce

## 2019-11-24 NOTE — Discharge Instructions (Signed)
Stop smoking Use oxygen at night time daily

## 2019-11-24 NOTE — Clinical Social Work Note (Signed)
Pt does not want to do [facility] because he does not want to put a credit card on file. MD notified.   [place], [place]

## 2019-11-24 NOTE — Progress Notes (Signed)
Pt discharged via private vehicle. Discharge instructions explained and given to pt. No further questions at this time. No s/s of distress noted. IV and tele box removed 

## 2019-11-24 NOTE — Progress Notes (Signed)
Progress Note  Patient Name: Gabriel [name]. Date of Encounter: 11/24/2019  Primary Cardiologist: [name], MD   Subjective   Patient seen this morning, denies chest pain or shortness of breath.  Symptoms of shortness of breath has greatly improved since admission.  Will like to go home.  Inpatient Medications    Scheduled Meds: . aspirin  81 mg Oral Daily  . enoxaparin (LOVENOX) injection  40 mg Subcutaneous Q24H  . insulin aspart  0-15 Units Subcutaneous TID WC  . insulin aspart  0-5 Units Subcutaneous QHS  . insulin detemir  24 Units Subcutaneous QHS  . metoprolol  100 mg Oral Daily  . pantoprazole  20 mg Oral QHS  . Ensure Max Protein  11 oz Oral BID  . rosuvastatin  20 mg Oral Daily  . sacubitril-valsartan  1 tablet Oral BID  . sodium chloride flush  3 mL Intravenous Q12H  . spironolactone  25 mg Oral BID  . torsemide  40 mg Oral BID   Continuous Infusions: . sodium chloride     PRN Meds: sodium chloride, acetaminophen, albuterol, nitroGLYCERIN, ondansetron (ZOFRAN) IV, sodium chloride flush   Vital Signs    Vitals:   11/23/19 1526 11/23/19 2011 11/24/19 0434 11/24/19 0731  BP: (!) 148/94 (!) 140/99 133/87 (!) 134/99  Pulse: 65 69 72 70  Resp: 17   18  Temp: 98.4 F (36.9 C) 98.7 F (37.1 C) 97.9 F (36.6 C) 98 F (36.7 C)  TempSrc: Oral Oral Oral   SpO2: 98% 94% 100% 96%  Weight:      Height:        Intake/Output Summary (Last 24 hours) at 11/24/2019 1158 Last data filed at 11/24/2019 0810 Gross per 24 hour  Intake 360 ml  Output 5475 ml  Net -5115 ml   Last 3 Weights 11/23/2019 11/23/2019 11/22/2019  Weight (lbs) 248 lb 12.8 oz 248 lb 12.8 oz 245 lb  Weight (kg) 112.855 kg 112.855 kg 111.131 kg      Telemetry    Sinus rhythm, heart rate 77- Personally Reviewed  ECG    No new tracing observed- Personally Reviewed  Physical Exam   GEN: No acute distress.   Neck: No JVD Cardiac: RRR, no murmurs, rubs, or gallops.  Respiratory: Clear  to auscultation bilaterally. GI: Soft, nontender, distended  MS: No edema; No deformity. Neuro:  Nonfocal  Psych: Normal affect   Labs    High Sensitivity Troponin:   Recent Labs  Lab 11/22/19 0343 11/22/19 0530  TROPONINIHS 97* 94*      Chemistry Recent Labs  Lab 11/22/19 0343 11/23/19 0435 11/24/19 0522  NA 139 142 138  K 3.7 4.0 3.5  CL 106 101 99  CO2 24 30 31   GLUCOSE 256* 178* 130*  BUN 30* 31* 28*  CREATININE 1.24 1.27* 1.21  CALCIUM 9.1 8.9 8.9  PROT 6.8  --   --   ALBUMIN 3.5  --   --   AST 24  --   --   ALT 31  --   --   ALKPHOS 60  --   --   BILITOT 1.5*  --   --   GFRNONAA >60 >60 >60  GFRAA >60 >60 >60  ANIONGAP 9 11 8      Hematology Recent Labs  Lab 11/22/19 0343  WBC 9.7  RBC 6.21*  HGB 17.1*  HCT 53.2*  MCV 85.7  MCH 27.5  MCHC 32.1  RDW 17.1*  PLT 180  BNP Recent Labs  Lab 11/22/19 0343  BNP 1,698.0*     DDimer No results for input(s): DDIMER in the last 168 hours.   Radiology    ECHOCARDIOGRAM COMPLETE  Result Date: 11/23/2019    ECHOCARDIOGRAM REPORT   Patient Name:   Delwyn Lucibello. Date of Exam: 11/23/2019 Medical Rec #:  [id number]           Height:       72.0 in Accession #:    [id number]          Weight:       248.8 lb Date of Birth:  1971/05/28           BSA:          2.338 m Patient Age:    28 years            BP:           124/87 mmHg Patient Gender: M                   HR:           55 bpm. Exam Location:  [facility] Procedure: 2D Echo, Color Doppler, Cardiac Doppler and Intracardiac            Opacification Agent Indications:     [id number] CHF-Acute Systolic  History:         Patient has prior history of Echocardiogram examinations. NICM,                  CAD and Previous Myocardial Infarction, COPD and CKD, stage                  III; Risk Factors:Current Smoker, Hypertension and Diabetes.  Sonographer:     Charmayne Sheer RDCS (AE) Referring Phys:  [id number] Athena Masse Diagnosing Phys: Nelva Bush MD  Sonographer Comments:  Suboptimal apical window. Image acquisition challenging due to patient body habitus and Image acquisition challenging due to COPD. IMPRESSIONS  1. Left ventricular ejection fraction, by estimation, is 25 to 30%. The left ventricle has severely decreased function. The left ventricle demonstrates global hypokinesis. The left ventricular internal cavity size was mildly dilated. There is mild left ventricular hypertrophy. Left ventricular diastolic parameters are consistent with Grade III diastolic dysfunction (restrictive). Elevated left atrial pressure.  2. Right ventricular systolic function is moderately reduced. The right ventricular size is mildly enlarged. There is moderately elevated pulmonary artery systolic pressure.  3. Left atrial size was mild to moderately dilated.  4. Right atrial size was mildly dilated.  5. The mitral valve is normal in structure. Trivial mitral valve regurgitation. No evidence of mitral stenosis.  6. The aortic valve is tricuspid. Aortic valve regurgitation is mild to moderate. Mild aortic valve sclerosis is present, with no evidence of aortic valve stenosis.  7. Aortic dilatation noted. There is borderline dilatation of the aortic root measuring 38 mm.  8. The inferior vena cava is dilated in size with <50% respiratory variability, suggesting right atrial pressure of 15 mmHg. FINDINGS  Left Ventricle: Left ventricular ejection fraction, by estimation, is 25 to 30%. The left ventricle has severely decreased function. The left ventricle demonstrates global hypokinesis. Definity contrast agent was given IV to delineate the left ventricular endocardial borders. The left ventricular internal cavity size was mildly dilated. There is mild left ventricular hypertrophy. Left ventricular diastolic parameters are consistent with Grade III diastolic dysfunction (restrictive). Elevated left atrial pressure. Right Ventricle: The right ventricular size  is mildly enlarged. No increase in right  ventricular wall thickness. Right ventricular systolic function is moderately reduced. There is moderately elevated pulmonary artery systolic pressure. The tricuspid regurgitant velocity is 2.54 m/s, and with an assumed right atrial pressure of 15 mmHg, the estimated right ventricular systolic pressure is 0000000 mmHg. Left Atrium: Left atrial size was mild to moderately dilated. Right Atrium: Right atrial size was mildly dilated. Pericardium: Trivial pericardial effusion is present. The pericardial effusion is posterior to the left ventricle. Mitral Valve: The mitral valve is normal in structure. Trivial mitral valve regurgitation. No evidence of mitral valve stenosis. MV peak gradient, 2.9 mmHg. The mean mitral valve gradient is 1.0 mmHg. Tricuspid Valve: The tricuspid valve is normal in structure. Tricuspid valve regurgitation is mild. Aortic Valve: The aortic valve is tricuspid. . There is mild thickening and mild calcification of the aortic valve. Aortic valve regurgitation is mild to moderate. Mild aortic valve sclerosis is present, with no evidence of aortic valve stenosis. There is mild thickening of the aortic valve. There is mild calcification of the aortic valve. Aortic valve mean gradient measures 2.0 mmHg. Aortic valve peak gradient measures 4.4 mmHg. Aortic valve area, by VTI measures 3.49 cm. Pulmonic Valve: The pulmonic valve was not well visualized. Pulmonic valve regurgitation is trivial. No evidence of pulmonic stenosis. Aorta: Aortic dilatation noted. There is borderline dilatation of the aortic root measuring 38 mm. Venous: The inferior vena cava is dilated in size with less than 50% respiratory variability, suggesting right atrial pressure of 15 mmHg. IAS/Shunts: The interatrial septum was not well visualized.  LEFT VENTRICLE PLAX 2D LVIDd:         5.94 cm  Diastology LVIDs:         5.66 cm  LV e' lateral:   7.94 cm/s LV PW:         1.18 cm  LV E/e' lateral: 7.6 LV IVS:        1.11 cm  LV e'  medial:    4.13 cm/s LVOT diam:     2.60 cm  LV E/e' medial:  14.6 LV SV:         51 LV SV Index:   22 LVOT Area:     5.31 cm  RIGHT VENTRICLE RV Basal diam:  4.96 cm LEFT ATRIUM              Index       RIGHT ATRIUM           Index LA diam:        5.10 cm  2.18 cm/m  RA Area:     19.40 cm LA Vol (A2C):   60.4 ml  25.84 ml/m RA Volume:   54.60 ml  23.36 ml/m LA Vol (A4C):   105.0 ml 44.92 ml/m LA Biplane Vol: 82.8 ml  35.42 ml/m  AORTIC VALVE                   PULMONIC VALVE AV Area (Vmax):    3.70 cm    PV Vmax:       0.65 m/s AV Area (Vmean):   3.44 cm    PV Vmean:      35.300 cm/s AV Area (VTI):     3.49 cm    PV VTI:        0.072 m AV Vmax:           105.00 cm/s PV Peak grad:  1.7 mmHg AV Vmean:  66.600 cm/s PV Mean grad:  1.0 mmHg AV VTI:            0.147 m AV Peak Grad:      4.4 mmHg AV Mean Grad:      2.0 mmHg LVOT Vmax:         73.20 cm/s LVOT Vmean:        43.100 cm/s LVOT VTI:          0.096 m LVOT/AV VTI ratio: 0.66  AORTA Ao Root diam: 3.80 cm MITRAL VALVE               TRICUSPID VALVE MV Area (PHT): 2.20 cm    TR Peak grad:   25.8 mmHg MV Peak grad:  2.9 mmHg    TR Vmax:        254.00 cm/s MV Mean grad:  1.0 mmHg MV Vmax:       0.84 m/s    SHUNTS MV Vmean:      50.5 cm/s   Systemic VTI:  0.10 m MV Decel Time: 345 msec    Systemic Diam: 2.60 cm MV E velocity: 60.13 cm/s Nelva Bush MD Electronically signed by Nelva Bush MD Signature Date/Time: 11/23/2019/2:49:08 PM    Final     Cardiac Studies   TTE (06/26/2019): 1. Left ventricular ejection fraction, by visual estimation, is 30 to  35%. The left ventricle has moderately decreased function. There is no  left ventricular hypertrophy.  2. Definity contrast agent was given IV to delineate the left ventricular  endocardial borders.  3. The left ventricle demonstrates global hypokinesis.  4. Global right ventricle has normal systolic function.The right  ventricular size is mildly enlarged. No increase in right  ventricular wall  thickness.  5. Left atrial size was moderately dilated.  6. Moderately elevated pulmonary artery systolic pressure.  7. The tricuspid regurgitant velocity is 3.10 m/s, and with an assumed  right atrial pressure of 20 mmHg, the estimated right ventricular systolic  pressure is moderately elevated at 58.4 mmHg.  8. Moderately dilated left ventricular internal cavity size.    Patient Profile     49 y.o. male with history of nonischemic cardiomyopathy, EF 30 to 35%, hypertension, hyperlipidemia, diabetes who is being seen due to shortness of breath and volume overload.  Assessment & Plan    1.  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 -We will switch IV Lasix to torsemide 40 mg twice daily. -Continue current doses of Toprol XL, Entresto, Aldactone. -Patient cautioned on daily weight and weekly weight gain.   -Follow-up in the office with [name] on discharge  2.  History of NSVT. -Recommend follow-up with EP as outpatient. -Consider ICD if EF remains less than 35%.      Signed, Kate Sable, MD  11/24/2019, 11:58 AM

## 2019-11-24 NOTE — Progress Notes (Signed)
Overnight Pulse Oximetry Study complete.

## 2019-11-24 NOTE — Discharge Summary (Signed)
[facility] NAME: Gabriel [name]    MR#:  [id number]  DATE OF BIRTH:  1971/02/27  DATE OF ADMISSION:  11/22/2019 ADMITTING PHYSICIAN: Athena Masse, MD  DATE OF DISCHARGE: 11/24/2019  PRIMARY CARE PHYSICIAN: Alisa Graff, FNP    ADMISSION DIAGNOSIS:  CHF exacerbation ([place]) [I50.9] Acute on chronic combined systolic and diastolic congestive heart failure (HCC) [I50.43]  DISCHARGE DIAGNOSIS:  Acute on Chronic systolic CHF exacerbation  SECONDARY DIAGNOSIS:   Past Medical History:  Diagnosis Date  . Chest wall pain, chronic   . Chronic systolic CHF (congestive heart failure) ([place])    a. 03/2015 Echo: EF 45-50%; b. 12/2015 Echo: EF 20-25%; c. 02/2016 Echo: EF 30-35%; d. 11/2016 Echo: EF 40-45%; e. 06/2019 Echo: EF 30-35%.  . Chronic Troponin Elevation   . CKD (chronic kidney disease), stage III   . COPD (chronic obstructive pulmonary disease) ([place])   . Diabetes mellitus without complication ([place])   . Hypertension    Resolved since weight loss  . NICM (nonischemic cardiomyopathy) ([place])    a. 03/2015 Echo: EF 45-50%; b. 04/2015 MV: small defect of mild severity in apex 2/2 apical thinning, EF 30-44%;  c. 12/2015 Echo: EF 20-25%; d. 02/2016 Echo: EF 30-35%; e. 11/2016 Echo: EF 40-45%, diff HK, mildly dil Ao root, mild MR, mod dil LA;  f. 12/2016 Cath: LM nl, LAD/Diags/LCX/OMs min irregs, RCA 40p/m/d; g. 06/2019 Echo: EF 30-35%, glob HK. Mod dil LA. RVSP 58.4 mmHg.  . Non-obstructive CAD (coronary artery disease)    a. 04/2015 low risk MV;  b. 12/2016 Cath: minor irregs in LAD/Diag/LCX/OM, RCA 40p/m/d.  [name] (nonsustained ventricular tachycardia) ([place])    a. 12/2015 noted on tele-->amio;  b. 12/2015 Event monitor: no VT. Atach noted.  . Obesity (BMI 30.0-34.9)   . Psoriasis   . Renal insufficiency   . Syncope    a. 01/2016 - felt to be vasovagal.  . Tobacco abuse     HOSPITAL COURSE:   Gabriel Fordis a 49 y.o.malewith medical  history significantfor nonischemic cardiomyopathy, nicotine dependence,chronic systolic heart failure (LVEF 30 - 35%),HTN, DM2,chronic chest wall pain, nonobstructive coronary artery disease, CKD2, noncompliance with medication who presentsto the emergency room for evaluation of shortness of breath that has progressively worsened over the last 3 days.  Acute on chronic  systolic and CHF (congestive heart failure) (HCC) due to non-ischemic cardiomyopathy -previous 2D echo in 2020 showed30 to 35% -IV lasix 40 mg bid--UOP >14 liters so far--change to po torsemide  40 mg bid -weaned oxygen to RA -cont current doses of metoprolol, enteresto and spironolactone--per Dr End -pt follows up at [facility] with Otila Kluver Hackney--helps with meds -he is trying to be compliant with his diet -advised smoking cessation  Hypertensive urgency.Bp is 136/94 this AM. -will be on metoprolol, lasix and Entresto -on clonidine  Episodes of NSVT--has been evaluated by Dr Belva Chimes -needs ICD--pt has put it off due to no insurance and financial strain -4/7 --> 2D echo EF 20-30%--pt will need ICD placement--> will need to f/u with Dr Caryl Comes as out pt  Chest pain with elevated troponin I: trop 97--94. -Likely demand ischemia. -CP resolved  Dyslipidemia. - continue statin therapy.  Type II diabetes mellitus with renal manifestations. -continue home meds with lantus 24 units at bedtime  Tobacco use: -nicotine patch -advised cessation  CKD (chronic kidney disease), stage IIIa:stable. -creat 1.27  Suspected OSA with Obesity -Overnite pulse oximetry--showed desaturations--pt will  be set up with home oxygen 2liters/min at night time--pt agreeable -pt has not gotten his sleep study yet.  Procedures:none Family communication :none today Consults :[facility] Discharge Disposition :discharge home today.[place] with [facility] cardiology CODE STATUS: full DVT Prophylaxis :lovenox  CONSULTS  OBTAINED:  Treatment Team:  Nelva Bush, MD  DRUG ALLERGIES:   Allergies  Allergen Reactions  . Metformin And Related Nausea And Vomiting  . Prednisone Other (See Comments)    Reaction: Hallucinations    . Zantac [Ranitidine Hcl] Diarrhea and Nausea Only    Night sweats    DISCHARGE MEDICATIONS:   Allergies as of 11/24/2019      Reactions   Metformin And Related Nausea And Vomiting   Prednisone Other (See Comments)   Reaction: Hallucinations    Zantac [ranitidine Hcl] Diarrhea, Nausea Only   Night sweats      Medication List    TAKE these medications   albuterol 108 (90 Base) MCG/ACT inhaler Commonly known as: VENTOLIN HFA Inhale 2-4 puffs by mouth every 4 hours as needed for wheezing, cough, and/or shortness of breath   aspirin 81 MG chewable tablet Chew 1 tablet (81 mg total) by mouth daily. Reported on 02/08/2016   cloNIDine 0.1 MG tablet Commonly known as: Catapres Take 1 tablet (0.1 mg total) by mouth 2 (two) times daily.   Entresto 97-103 MG Generic drug: sacubitril-valsartan Take 1 tablet by mouth 2 (two) times daily.   insulin detemir 100 UNIT/ML injection Commonly known as: Levemir Inject 0.24 mLs (24 Units total) into the skin at bedtime.   metoprolol 200 MG 24 hr tablet Commonly known as: Toprol XL Take 0.5 tablets (100 mg total) by mouth daily.   nitroGLYCERIN 0.4 MG SL tablet Commonly known as: NITROSTAT Place 1 tablet (0.4 mg total) under the tongue every 5 (five) minutes x 3 doses as needed for chest pain.   pantoprazole 20 MG tablet Commonly known as: Protonix Take 1 tablet (20 mg total) by mouth at bedtime.   rosuvastatin 20 MG tablet Commonly known as: CRESTOR Take 1 tablet (20 mg total) by mouth daily.   spironolactone 25 MG tablet Commonly known as: ALDACTONE Take 1 tablet (25 mg total) by mouth 2 (two) times daily.   torsemide 20 MG tablet Commonly known as: DEMADEX Take 2 tablets (40 mg total) by mouth 2 (two) times  daily. What changed:   how much to take  when to take this            Durable Medical Equipment  (From admission, onward)         Start     Ordered   11/24/19 1016  For home use only DME oxygen  Once    Question Answer Comment  Length of Need Lifetime   Mode or (Route) Nasal cannula   Liters per Minute 2   Frequency Only at night (stationary unit needed)   Oxygen conserving device Yes   Oxygen delivery system Gas      11/24/19 1016          If you experience worsening of your admission symptoms, develop shortness of breath, life threatening emergency, suicidal or homicidal thoughts you must seek medical attention immediately by calling 911 or calling your MD immediately  if symptoms less severe.  You Must read complete instructions/literature along with all the possible adverse reactions/side effects for all the Medicines you take and that have been prescribed to you. Take any new Medicines after you have completely understood and  accept all the possible adverse reactions/side effects.   Please note  You were cared for by a hospitalist during your hospital stay. If you have any questions about your discharge medications or the care you received while you were in the hospital after you are discharged, you can call the unit and asked to speak with the hospitalist on call if the hospitalist that took care of you is not available. Once you are discharged, your primary care physician will handle any further medical issues. Please note that NO REFILLS for any discharge medications will be authorized once you are discharged, as it is imperative that you return to your primary care physician (or establish a relationship with a primary care physician if you do not have one) for your aftercare needs so that they can reassess your need for medications and monitor your lab values. Today   SUBJECTIVE   Feels back to baseline  VITAL SIGNS:  Blood pressure (!) 134/99, pulse 70,  temperature 98 F (36.7 C), resp. rate 18, height 6' (1.829 m), weight 112.9 kg, SpO2 96 %.  I/O:    Intake/Output Summary (Last 24 hours) at 11/24/2019 1041 Last data filed at 11/24/2019 0810 Gross per 24 hour  Intake 360 ml  Output 6225 ml  Net -5865 ml    PHYSICAL EXAMINATION:  GENERAL:  49 y.o.-year-old patient lying in the bed with no acute distress. Obese EYES: Pupils equal, round, reactive to light and accommodation. No scleral icterus.  HEENT: Head atraumatic, normocephalic. Oropharynx and nasopharynx clear.  NECK:  Supple, no jugular venous distention. No thyroid enlargement, no tenderness.  LUNGS: Normal breath sounds bilaterally, no wheezing, rales,rhonchi or crepitation. No use of accessory muscles of respiration.  CARDIOVASCULAR: S1, S2 normal. No murmurs, rubs, or gallops.  ABDOMEN: Soft, non-tender, non-distended. Bowel sounds present. No organomegaly or mass.  EXTREMITIES: No pedal edema, cyanosis, or clubbing.  NEUROLOGIC: Cranial nerves II through XII are intact. Muscle strength 5/5 in all extremities. Sensation intact. Gait not checked.  PSYCHIATRIC: The patient is alert and oriented x 3.  SKIN: No obvious rash, lesion, or ulcer.   DATA REVIEW:   CBC  Recent Labs  Lab 11/22/19 0343  WBC 9.7  HGB 17.1*  HCT 53.2*  PLT 180    Chemistries  Recent Labs  Lab 11/22/19 0343 11/22/19 0343 11/23/19 0435 11/23/19 0435 11/24/19 0522  NA 139   < > 142   < > 138  K 3.7   < > 4.0   < > 3.5  CL 106   < > 101   < > 99  CO2 24   < > 30   < > 31  GLUCOSE 256*   < > 178*   < > 130*  BUN 30*   < > 31*   < > 28*  CREATININE 1.24   < > 1.27*   < > 1.21  CALCIUM 9.1   < > 8.9   < > 8.9  MG  --   --  1.8  --   --   AST 24  --   --   --   --   ALT 31  --   --   --   --   ALKPHOS 60  --   --   --   --   BILITOT 1.5*  --   --   --   --    < > = values in this interval not displayed.    Microbiology  Results   Recent Results (from the past 240 hour(s))  SARS  CORONAVIRUS 2 (TAT 6-24 HRS) Nasopharyngeal Nasopharyngeal Swab     Status: None   Collection Time: 11/23/19  1:19 AM   Specimen: Nasopharyngeal Swab  Result Value Ref Range Status   SARS Coronavirus 2 NEGATIVE NEGATIVE Final    Comment: (NOTE) SARS-CoV-2 target nucleic acids are NOT DETECTED. The SARS-CoV-2 RNA is generally detectable in upper and lower respiratory specimens during the acute phase of infection. Negative results do not preclude SARS-CoV-2 infection, do not rule out co-infections with other pathogens, and should not be used as the sole basis for treatment or other patient management decisions. Negative results must be combined with clinical observations, patient history, and epidemiological information. The expected result is Negative. Fact Sheet for Patients: SugarRoll.be Fact Sheet for Healthcare Providers: https://www.woods-mathews.com/ This test is not yet approved or cleared by the [place] FDA and  has been authorized for detection and/or diagnosis of SARS-CoV-2 by FDA under an Emergency Use Authorization (EUA). This EUA will remain  in effect (meaning this test can be used) for the duration of the COVID-19 declaration under Section 56 4(b)(1) of the Act, 21 U.S.C. section 360bbb-3(b)(1), unless the authorization is terminated or revoked sooner. Performed at [facility], [place] [address] [place], [place] [address]     RADIOLOGY:  ECHOCARDIOGRAM COMPLETE  Result Date: 11/23/2019    ECHOCARDIOGRAM REPORT   Patient Name:   Gabriel [name]. Date of Exam: 11/23/2019 Medical Rec #:  [id number]           Height:       72.0 in Accession #:    [id number]          Weight:       248.8 lb Date of Birth:  25-Apr-1971           BSA:          2.338 m Patient Age:    22 years            BP:           124/87 mmHg Patient Gender: M                   HR:           55 bpm. Exam Location:  [facility] Procedure: 2D Echo, Color Doppler,  Cardiac Doppler and Intracardiac            Opacification Agent Indications:     [id number] CHF-Acute Systolic  History:         Patient has prior history of Echocardiogram examinations. NICM,                  CAD and Previous Myocardial Infarction, COPD and CKD, stage                  III; Risk Factors:Current Smoker, Hypertension and Diabetes.  Sonographer:     Charmayne Sheer RDCS (AE) Referring Phys:  [id number] Athena Masse Diagnosing Phys: Nelva Bush MD  Sonographer Comments: Suboptimal apical window. Image acquisition challenging due to patient body habitus and Image acquisition challenging due to COPD. IMPRESSIONS  1. Left ventricular ejection fraction, by estimation, is 25 to 30%. The left ventricle has severely decreased function. The left ventricle demonstrates global hypokinesis. The left ventricular internal cavity size was mildly dilated. There is mild left ventricular hypertrophy. Left ventricular diastolic parameters are consistent with Grade III diastolic dysfunction (restrictive). Elevated left  atrial pressure.  2. Right ventricular systolic function is moderately reduced. The right ventricular size is mildly enlarged. There is moderately elevated pulmonary artery systolic pressure.  3. Left atrial size was mild to moderately dilated.  4. Right atrial size was mildly dilated.  5. The mitral valve is normal in structure. Trivial mitral valve regurgitation. No evidence of mitral stenosis.  6. The aortic valve is tricuspid. Aortic valve regurgitation is mild to moderate. Mild aortic valve sclerosis is present, with no evidence of aortic valve stenosis.  7. Aortic dilatation noted. There is borderline dilatation of the aortic root measuring 38 mm.  8. The inferior vena cava is dilated in size with <50% respiratory variability, suggesting right atrial pressure of 15 mmHg. FINDINGS  Left Ventricle: Left ventricular ejection fraction, by estimation, is 25 to 30%. The left ventricle has severely decreased  function. The left ventricle demonstrates global hypokinesis. Definity contrast agent was given IV to delineate the left ventricular endocardial borders. The left ventricular internal cavity size was mildly dilated. There is mild left ventricular hypertrophy. Left ventricular diastolic parameters are consistent with Grade III diastolic dysfunction (restrictive). Elevated left atrial pressure. Right Ventricle: The right ventricular size is mildly enlarged. No increase in right ventricular wall thickness. Right ventricular systolic function is moderately reduced. There is moderately elevated pulmonary artery systolic pressure. The tricuspid regurgitant velocity is 2.54 m/s, and with an assumed right atrial pressure of 15 mmHg, the estimated right ventricular systolic pressure is 0000000 mmHg. Left Atrium: Left atrial size was mild to moderately dilated. Right Atrium: Right atrial size was mildly dilated. Pericardium: Trivial pericardial effusion is present. The pericardial effusion is posterior to the left ventricle. Mitral Valve: The mitral valve is normal in structure. Trivial mitral valve regurgitation. No evidence of mitral valve stenosis. MV peak gradient, 2.9 mmHg. The mean mitral valve gradient is 1.0 mmHg. Tricuspid Valve: The tricuspid valve is normal in structure. Tricuspid valve regurgitation is mild. Aortic Valve: The aortic valve is tricuspid. . There is mild thickening and mild calcification of the aortic valve. Aortic valve regurgitation is mild to moderate. Mild aortic valve sclerosis is present, with no evidence of aortic valve stenosis. There is mild thickening of the aortic valve. There is mild calcification of the aortic valve. Aortic valve mean gradient measures 2.0 mmHg. Aortic valve peak gradient measures 4.4 mmHg. Aortic valve area, by VTI measures 3.49 cm. Pulmonic Valve: The pulmonic valve was not well visualized. Pulmonic valve regurgitation is trivial. No evidence of pulmonic stenosis. Aorta:  Aortic dilatation noted. There is borderline dilatation of the aortic root measuring 38 mm. Venous: The inferior vena cava is dilated in size with less than 50% respiratory variability, suggesting right atrial pressure of 15 mmHg. IAS/Shunts: The interatrial septum was not well visualized.  LEFT VENTRICLE PLAX 2D LVIDd:         5.94 cm  Diastology LVIDs:         5.66 cm  LV e' lateral:   7.94 cm/s LV PW:         1.18 cm  LV E/e' lateral: 7.6 LV IVS:        1.11 cm  LV e' medial:    4.13 cm/s LVOT diam:     2.60 cm  LV E/e' medial:  14.6 LV SV:         51 LV SV Index:   22 LVOT Area:     5.31 cm  RIGHT VENTRICLE RV Basal diam:  4.96 cm LEFT ATRIUM  Index       RIGHT ATRIUM           Index LA diam:        5.10 cm  2.18 cm/m  RA Area:     19.40 cm LA Vol (A2C):   60.4 ml  25.84 ml/m RA Volume:   54.60 ml  23.36 ml/m LA Vol (A4C):   105.0 ml 44.92 ml/m LA Biplane Vol: 82.8 ml  35.42 ml/m  AORTIC VALVE                   PULMONIC VALVE AV Area (Vmax):    3.70 cm    PV Vmax:       0.65 m/s AV Area (Vmean):   3.44 cm    PV Vmean:      35.300 cm/s AV Area (VTI):     3.49 cm    PV VTI:        0.072 m AV Vmax:           105.00 cm/s PV Peak grad:  1.7 mmHg AV Vmean:          66.600 cm/s PV Mean grad:  1.0 mmHg AV VTI:            0.147 m AV Peak Grad:      4.4 mmHg AV Mean Grad:      2.0 mmHg LVOT Vmax:         73.20 cm/s LVOT Vmean:        43.100 cm/s LVOT VTI:          0.096 m LVOT/AV VTI ratio: 0.66  AORTA Ao Root diam: 3.80 cm MITRAL VALVE               TRICUSPID VALVE MV Area (PHT): 2.20 cm    TR Peak grad:   25.8 mmHg MV Peak grad:  2.9 mmHg    TR Vmax:        254.00 cm/s MV Mean grad:  1.0 mmHg MV Vmax:       0.84 m/s    SHUNTS MV Vmean:      50.5 cm/s   Systemic VTI:  0.10 m MV Decel Time: 345 msec    Systemic Diam: 2.60 cm MV E velocity: 60.13 cm/s Nelva Bush MD Electronically signed by Nelva Bush MD Signature Date/Time: 11/23/2019/2:49:08 PM    Final      CODE STATUS:     Code  Status Orders  (From admission, onward)         Start     Ordered   11/22/19 0617  Full code  Continuous     11/22/19 0619        Code Status History    Date Active Date Inactive Code Status Order ID Comments User Context   09/22/2019 1026 09/23/2019 1703 Full Code [id number]  Vashti Hey, MD Inpatient   06/26/2019 0038 06/27/2019 1729 Full Code [id number]  Mansy, Arvella Merles, MD ED   10/01/2017 0027 10/01/2017 1506 Full Code [id number]  Lance Coon, MD Inpatient   08/21/2017 1350 08/24/2017 1615 Full Code [id number]  Gladstone Lighter, MD Inpatient   04/28/2017 1653 04/29/2017 1755 Full Code [id number]  Gladstone Lighter, MD Inpatient   01/05/2017 1555 01/07/2017 1643 Full Code [id number]  Nicholes Mango, MD ED   11/28/2016 1342 11/29/2016 2259 Full Code [id number]  Demetrios Loll, MD Inpatient   10/28/2016 1358 10/30/2016 1354 Full Code [id number]  Hillary Bow, MD ED   03/15/2016 [phone] 03/16/2016  [place] Full Code [id number]  Harrie Foreman, MD Inpatient   02/16/2016 0224 02/17/2016 1302 Full Code [id number]  Harrie Foreman, MD Inpatient   01/22/2016 2351 01/23/2016 1641 Full Code [id number]  Demetrios Loll, MD ED   01/03/2016 1422 01/09/2016 1454 Full Code [id number]  Lavetta Nielsen Aaron Mose, MD ED   05/17/2015 2341 05/19/2015 1731 Full Code [id number]  Lance Coon, MD Inpatient   03/20/2015 0826 03/21/2015 1527 Full Code [id number]  Harrie Foreman, MD Inpatient   Advance Care Planning Activity       TOTAL TIME TAKING CARE OF THIS PATIENT: *40* minutes.    Fritzi Mandes M.D  [facility]    CC: Primary care physician; Alisa Graff, FNP

## 2019-11-24 NOTE — Progress Notes (Signed)
Patient ID: Gabriel [name]., male   DOB: 08-23-1970, 49 y.o.   MRN: [id number]  [facility] agency unable to get [facility] oxygen since pt not wanting to provide credit card info.

## 2019-11-25 ENCOUNTER — Other Ambulatory Visit: Payer: Self-pay | Admitting: *Deleted

## 2019-11-25 DIAGNOSIS — I5040 Unspecified combined systolic (congestive) and diastolic (congestive) heart failure: Secondary | ICD-10-CM

## 2019-11-26 ENCOUNTER — Telehealth: Payer: Self-pay | Admitting: Family

## 2019-11-26 NOTE — Telephone Encounter (Signed)
Spoke with patient who didn't answer many questions but confirmed hes doing ok since he got out of the hospital and is not having any other issues other than his normal. He also confirmed his follow up [facility] appointment for 4/13.   Alyse Low, [place]

## 2019-11-27 NOTE — Progress Notes (Deleted)
Patient ID: Gabriel [name]., male    DOB: 12-Mar-1971, 49 y.o.   MRN: [id number]  HPI  Gabriel [name] is a 49 y/o male with a history of HTN, diabetes, COPD, CKD, asthma, previous tobacco use and chronic heart failure.   Echo report from 11/23/19 reviewed and showed an EF of 25-30% with mild LVH and moderately elevated PA Pressure. Echo report from 06/26/2019 reviewed and showed an EF of 30-35% along with moderately elevated PA pressure. Echo done 04/29/17 showed EF 45-50%. Echo report that was done 11/28/16 and it showed an EF of 40-45% along with mild Gabriel. EF has improved from 30-35% July 2017.  Admitted 11/22/19 due to acute on chronic HF. Initially needed IV lasix with resultant loss of >14L with transition to oral diuretics. Cardiology consult obtained. Elevated troponin thought to be due to demand ischemia. Needed oxygen at bedtime. Needs ICD but no insurance. Discharged after 2 days. Admitted 09/22/19 due to chest pain after not getting his medications refilled for 1 week prior to episode. No acute EKG changes and medications resumed. Discharged the following day. Was in the ED 09/05/19 due to dizziness. Glucose >400. IVF's  insulin given. COVID negative and he was released.   He presents today for a follow-up visit with a chief complaint of    Past Medical History:  Diagnosis Date  . Chest wall pain, chronic   . Chronic systolic CHF (congestive heart failure) ([place])    a. 03/2015 Echo: EF 45-50%; b. 12/2015 Echo: EF 20-25%; c. 02/2016 Echo: EF 30-35%; d. 11/2016 Echo: EF 40-45%; e. 06/2019 Echo: EF 30-35%.  . Chronic Troponin Elevation   . CKD (chronic kidney disease), stage III   . COPD (chronic obstructive pulmonary disease) ([place])   . Diabetes mellitus without complication ([place])   . Hypertension    Resolved since weight loss  . NICM (nonischemic cardiomyopathy) ([place])    a. 03/2015 Echo: EF 45-50%; b. 04/2015 MV: small defect of mild severity in apex 2/2 apical thinning, EF 30-44%;  c. 12/2015 Echo:  EF 20-25%; d. 02/2016 Echo: EF 30-35%; e. 11/2016 Echo: EF 40-45%, diff HK, mildly dil Ao root, mild Gabriel, mod dil LA;  f. 12/2016 Cath: LM nl, LAD/Diags/LCX/OMs min irregs, RCA 40p/m/d; g. 06/2019 Echo: EF 30-35%, glob HK. Mod dil LA. RVSP 58.4 mmHg.  . Non-obstructive CAD (coronary artery disease)    a. 04/2015 low risk MV;  b. 12/2016 Cath: minor irregs in LAD/Diag/LCX/OM, RCA 40p/m/d.  [name] (nonsustained ventricular tachycardia) ([place])    a. 12/2015 noted on tele-->amio;  b. 12/2015 Event monitor: no VT. Atach noted.  . Obesity (BMI 30.0-34.9)   . Psoriasis   . Renal insufficiency   . Syncope    a. 01/2016 - felt to be vasovagal.  . Tobacco abuse    Past Surgical History:  Procedure Laterality Date  . AMPUTATION    . CARDIAC CATHETERIZATION    . FINGER AMPUTATION     Traumatic  . FINGER FRACTURE SURGERY Left   . LEFT HEART CATH AND CORONARY ANGIOGRAPHY N/A 01/06/2017   Procedure: Left Heart Cath and Coronary Angiography;  Surgeon: Wellington Hampshire, MD;  Location: [place] [facility];  Service: Cardiovascular;  Laterality: N/A;   Family History  Problem Relation Age of Onset  . Diabetes Mellitus II Mother   . Hypertension Father   . Cancer Paternal Aunt   . Cancer Maternal Grandfather   . Diabetes Maternal Grandfather    Social History  Tobacco Use  . Smoking status: Former Smoker    Packs/day: 0.50    Years: 33.00    Pack years: 16.50    Types: Cigarettes    Quit date: 08/18/2019    Years since quitting: 0.2  . Smokeless tobacco: Never Used  . Tobacco comment: Quit a little over a month ago.  Substance Use Topics  . Alcohol use: Yes    Alcohol/week: 0.0 standard drinks    Comment: occassionally   Allergies  Allergen Reactions  . Metformin And Related Nausea And Vomiting  . Prednisone Other (See Comments)    Reaction: Hallucinations    . Zantac [Ranitidine Hcl] Diarrhea and Nausea Only    Night sweats      Review of Systems  Constitutional: Positive for  fatigue. Negative for appetite change.  HENT: Positive for congestion. Negative for postnasal drip and sore throat.   Eyes: Negative.   Respiratory: Positive for shortness of breath (easily). Negative for cough and chest tightness.   Cardiovascular: Positive for chest pain ("just the usual") and palpitations. Negative for leg swelling.  Gastrointestinal: Positive for nausea and vomiting (after metformin). Negative for abdominal distention and abdominal pain.  Endocrine: Negative.   Genitourinary: Negative.   Musculoskeletal: Positive for back pain.  Skin: Negative.   Allergic/Immunologic: Negative.   Neurological: Positive for light-headedness and numbness (down both legs at times). Negative for dizziness and headaches.  Hematological: Negative for adenopathy. Does not bruise/bleed easily.  Psychiatric/Behavioral: Positive for sleep disturbance. Negative for dysphoric mood and suicidal ideas. The patient is not nervous/anxious.      Physical Exam  Constitutional: He is oriented to person, place, and time. He appears well-developed and well-nourished.  HENT:  Head: Normocephalic and atraumatic.  Neck: No JVD present.  Cardiovascular: Normal rate and regular rhythm.  Pulmonary/Chest: Effort normal. He has no wheezes. He has no rales. He exhibits no tenderness.  Abdominal: Soft. He exhibits no distension. There is no abdominal tenderness.  Musculoskeletal:        General: No tenderness or edema.     Cervical back: Normal range of motion and neck supple.  Neurological: He is alert and oriented to person, place, and time.  Skin: Skin is warm and dry.  Psychiatric: He has a normal mood and affect. His behavior is normal. Thought content normal.  Nursing note and vitals reviewed.   Assessment & Plan:  1: Chronic heart failure with reduced ejection fraction- - NYHA class II - euvolemic today - weighing daily and reminded to call for an overnight weight gain of >2 pounds or a weekly  weight gain of >5 pounds.  - weight 250 pounds from last visit 2 months ago - not adding salt and he was encouraged to closely follow a 2000mg  sodium diet - saw cardiology Gilford Rile) 10/11/19  - saw EP Caryl Comes) 09/02/19 - BNP from 11/22/19 was 1698.0  2: HTN- - BP  - saw PCP @ [facility] 04/07/2019 but does not want to return; explained that he could call [facility], [facility] or [facility] for fee based visits - BMP from 11/24/19 reviewed and showed sodium 138, potassium 3.5, creatinine 1.21 and GFR >60  3: Diabetes-   -  A1c 8.7% on 11/23/19 - fasting glucose in clinic today was  - says that he doesn't want to take metformin due to nausea/ vomiting after every door; explained that he needed to follow-up with PCP regarding this - followed by [facility] - completed some  classes at the [facility]   Medication bottles were reviewed.

## 2019-11-30 ENCOUNTER — Ambulatory Visit: Payer: Medicaid Other | Admitting: Family

## 2019-12-01 ENCOUNTER — Other Ambulatory Visit: Payer: Self-pay

## 2019-12-01 ENCOUNTER — Ambulatory Visit: Payer: Medicaid Other | Attending: Family | Admitting: Family

## 2019-12-01 ENCOUNTER — Encounter: Payer: Self-pay | Admitting: Family

## 2019-12-01 VITALS — BP 131/79 | HR 77 | Resp 20 | Ht 72.0 in | Wt 232.4 lb

## 2019-12-01 DIAGNOSIS — Z8249 Family history of ischemic heart disease and other diseases of the circulatory system: Secondary | ICD-10-CM | POA: Insufficient documentation

## 2019-12-01 DIAGNOSIS — Z7982 Long term (current) use of aspirin: Secondary | ICD-10-CM | POA: Diagnosis not present

## 2019-12-01 DIAGNOSIS — I5022 Chronic systolic (congestive) heart failure: Secondary | ICD-10-CM | POA: Insufficient documentation

## 2019-12-01 DIAGNOSIS — E1122 Type 2 diabetes mellitus with diabetic chronic kidney disease: Secondary | ICD-10-CM | POA: Insufficient documentation

## 2019-12-01 DIAGNOSIS — E669 Obesity, unspecified: Secondary | ICD-10-CM | POA: Insufficient documentation

## 2019-12-01 DIAGNOSIS — R42 Dizziness and giddiness: Secondary | ICD-10-CM | POA: Diagnosis not present

## 2019-12-01 DIAGNOSIS — R0602 Shortness of breath: Secondary | ICD-10-CM | POA: Insufficient documentation

## 2019-12-01 DIAGNOSIS — E119 Type 2 diabetes mellitus without complications: Secondary | ICD-10-CM

## 2019-12-01 DIAGNOSIS — M549 Dorsalgia, unspecified: Secondary | ICD-10-CM | POA: Insufficient documentation

## 2019-12-01 DIAGNOSIS — Z794 Long term (current) use of insulin: Secondary | ICD-10-CM | POA: Insufficient documentation

## 2019-12-01 DIAGNOSIS — G479 Sleep disorder, unspecified: Secondary | ICD-10-CM | POA: Insufficient documentation

## 2019-12-01 DIAGNOSIS — Z6830 Body mass index (BMI) 30.0-30.9, adult: Secondary | ICD-10-CM | POA: Diagnosis not present

## 2019-12-01 DIAGNOSIS — I25118 Atherosclerotic heart disease of native coronary artery with other forms of angina pectoris: Secondary | ICD-10-CM | POA: Diagnosis not present

## 2019-12-01 DIAGNOSIS — I130 Hypertensive heart and chronic kidney disease with heart failure and stage 1 through stage 4 chronic kidney disease, or unspecified chronic kidney disease: Secondary | ICD-10-CM | POA: Insufficient documentation

## 2019-12-01 DIAGNOSIS — G8929 Other chronic pain: Secondary | ICD-10-CM | POA: Diagnosis not present

## 2019-12-01 DIAGNOSIS — N183 Chronic kidney disease, stage 3 unspecified: Secondary | ICD-10-CM | POA: Diagnosis not present

## 2019-12-01 DIAGNOSIS — R200 Anesthesia of skin: Secondary | ICD-10-CM | POA: Diagnosis not present

## 2019-12-01 DIAGNOSIS — I10 Essential (primary) hypertension: Secondary | ICD-10-CM

## 2019-12-01 DIAGNOSIS — R7989 Other specified abnormal findings of blood chemistry: Secondary | ICD-10-CM | POA: Insufficient documentation

## 2019-12-01 DIAGNOSIS — Z87891 Personal history of nicotine dependence: Secondary | ICD-10-CM | POA: Diagnosis not present

## 2019-12-01 DIAGNOSIS — Z79899 Other long term (current) drug therapy: Secondary | ICD-10-CM | POA: Insufficient documentation

## 2019-12-01 DIAGNOSIS — R079 Chest pain, unspecified: Secondary | ICD-10-CM | POA: Diagnosis not present

## 2019-12-01 DIAGNOSIS — J449 Chronic obstructive pulmonary disease, unspecified: Secondary | ICD-10-CM | POA: Diagnosis not present

## 2019-12-01 DIAGNOSIS — Z7901 Long term (current) use of anticoagulants: Secondary | ICD-10-CM | POA: Diagnosis not present

## 2019-12-01 LAB — GLUCOSE, CAPILLARY: Glucose-Capillary: 204 mg/dL — ABNORMAL HIGH (ref 70–99)

## 2019-12-01 MED ORDER — CLONIDINE HCL 0.1 MG PO TABS: 0.1000 mg | ORAL_TABLET | Freq: Two times a day (BID) | ORAL | 3 refills | Status: DC

## 2019-12-01 MED ORDER — TORSEMIDE 20 MG PO TABS: 40.0000 mg | ORAL_TABLET | Freq: Two times a day (BID) | ORAL | 3 refills | Status: DC

## 2019-12-01 MED ORDER — SPIRONOLACTONE 25 MG PO TABS: 25.0000 mg | ORAL_TABLET | Freq: Two times a day (BID) | ORAL | 3 refills | Status: DC

## 2019-12-01 MED ORDER — METOPROLOL SUCCINATE ER 200 MG PO TB24: 100.0000 mg | ORAL_TABLET | Freq: Every day | ORAL | 3 refills | Status: DC

## 2019-12-01 MED ORDER — ROSUVASTATIN CALCIUM 20 MG PO TABS: 20.0000 mg | ORAL_TABLET | Freq: Every day | ORAL | 3 refills | Status: DC

## 2019-12-01 NOTE — Progress Notes (Signed)
Patient ID: Gabriel Boline., male    DOB: 05-07-1971, 49 y.o.   MRN: [id number]  HPI  Gabriel [name] is a 49 y/o male with a history of HTN, diabetes, COPD, CKD, asthma, previous tobacco use and chronic heart failure.   Echo report from 11/23/19 reviewed and showed an EF of 25-30% with mild LVH and moderately elevated PA Pressure. Echo report from 06/26/2019 reviewed and showed an EF of 30-35% along with moderately elevated PA pressure. Echo done 04/29/17 showed EF 45-50%. Echo report that was done 11/28/16 and it showed an EF of 40-45% along with mild Gabriel. EF has improved from 30-35% July 2017.  Admitted 11/22/19 due to acute on chronic HF. Initially needed IV lasix with resultant loss of >14L with transition to oral diuretics. Cardiology consult obtained. Elevated troponin thought to be due to demand ischemia. Needed oxygen at bedtime. Needs ICD but no insurance. Discharged after 2 days. Admitted 09/22/19 due to chest pain after not getting his medications refilled for 1 week prior to episode. No acute EKG changes and medications resumed. Discharged the following day. Was in the ED 09/05/19 due to dizziness. Glucose >400. IVF's  insulin given. COVID negative and he was released.   He presents today for a follow-up visit with a chief complaint of minimal shortness of breath upon moderate exertion. He describes this as chronic in nature having been present for several years. He has associated fatigue, head congestion, light-headedness, leg numbness at times, stable angina, chronic back pain and difficulty sleeping along with this. He denies any abdominal distention, palpitations, pedal edema, cough or weight gain.   Working night shift and says that he's glad to be back at work. Did drink a small glass of coca-cola earlier because he says that his stomach was upset earlier today.    Past Medical History:  Diagnosis Date  . Chest wall pain, chronic   . Chronic systolic CHF (congestive heart failure) ([place])    a. 03/2015 Echo: EF 45-50%; b. 12/2015 Echo: EF 20-25%; c. 02/2016 Echo: EF 30-35%; d. 11/2016 Echo: EF 40-45%; e. 06/2019 Echo: EF 30-35%.  . Chronic Troponin Elevation   . CKD (chronic kidney disease), stage III   . COPD (chronic obstructive pulmonary disease) ([place])   . Diabetes mellitus without complication ([place])   . Hypertension    Resolved since weight loss  . NICM (nonischemic cardiomyopathy) ([place])    a. 03/2015 Echo: EF 45-50%; b. 04/2015 MV: small defect of mild severity in apex 2/2 apical thinning, EF 30-44%;  c. 12/2015 Echo: EF 20-25%; d. 02/2016 Echo: EF 30-35%; e. 11/2016 Echo: EF 40-45%, diff HK, mildly dil Ao root, mild Gabriel, mod dil LA;  f. 12/2016 Cath: LM nl, LAD/Diags/LCX/OMs min irregs, RCA 40p/m/d; g. 06/2019 Echo: EF 30-35%, glob HK. Mod dil LA. RVSP 58.4 mmHg.  . Non-obstructive CAD (coronary artery disease)    a. 04/2015 low risk MV;  b. 12/2016 Cath: minor irregs in LAD/Diag/LCX/OM, RCA 40p/m/d.  [name] (nonsustained ventricular tachycardia) ([place])    a. 12/2015 noted on tele-->amio;  b. 12/2015 Event monitor: no VT. Atach noted.  . Obesity (BMI 30.0-34.9)   . Psoriasis   . Renal insufficiency   . Syncope    a. 01/2016 - felt to be vasovagal.  . Tobacco abuse    Past Surgical History:  Procedure Laterality Date  . AMPUTATION    . CARDIAC CATHETERIZATION    . FINGER AMPUTATION     Traumatic  .  FINGER FRACTURE SURGERY Left   . LEFT HEART CATH AND CORONARY ANGIOGRAPHY N/A 01/06/2017   Procedure: Left Heart Cath and Coronary Angiography;  Surgeon: Gabriel Hampshire, MD;  Location: [place] [facility];  Service: Cardiovascular;  Laterality: N/A;   Family History  Problem Relation Age of Onset  . Diabetes Mellitus II Mother   . Hypertension Father   . Cancer Paternal Aunt   . Cancer Maternal Grandfather   . Diabetes Maternal Grandfather    Social History   Tobacco Use  . Smoking status: Former Smoker    Packs/day: 0.50    Years: 33.00    Pack years: 16.50    Types:  Cigarettes    Quit date: 08/18/2019    Years since quitting: 0.2  . Smokeless tobacco: Never Used  . Tobacco comment: Quit a little over a month ago.  Substance Use Topics  . Alcohol use: Yes    Alcohol/week: 0.0 standard drinks    Comment: occassionally   Allergies  Allergen Reactions  . Metformin And Related Nausea And Vomiting  . Prednisone Other (See Comments)    Reaction: Hallucinations    . Zantac [Ranitidine Hcl] Diarrhea and Nausea Only    Night sweats   Prior to Admission medications   Medication Sig Start Date End Date Taking? Authorizing Provider  aspirin 81 MG chewable tablet Chew 1 tablet (81 mg total) by mouth daily. Reported on 02/08/2016 08/27/18  Yes [name] A, FNP  cloNIDine (CATAPRES) 0.1 MG tablet Take 1 tablet (0.1 mg total) by mouth 2 (two) times daily. 09/23/19 12/01/19 Yes [name], MD  insulin detemir (LEVEMIR) 100 UNIT/ML injection Inject 0.24 mLs (24 Units total) into the skin at bedtime. 09/23/19 12/01/19 Yes [name], MD  metoprolol (TOPROL XL) 200 MG 24 hr tablet Take 0.5 tablets (100 mg total) by mouth daily. 10/11/19 12/01/19 Yes [name], NP  nitroGLYCERIN (NITROSTAT) 0.4 MG SL tablet Place 1 tablet (0.4 mg total) under the tongue every 5 (five) minutes x 3 doses as needed for chest pain. 06/27/19  Yes [name], MD  rosuvastatin (CRESTOR) 20 MG tablet Take 1 tablet (20 mg total) by mouth daily. 08/27/18  Yes Travarius Lange A, FNP  sacubitril-valsartan (ENTRESTO) 97-103 MG Take 1 tablet by mouth 2 (two) times daily.   Yes [provider]  spironolactone (ALDACTONE) 25 MG tablet Take 1 tablet (25 mg total) by mouth 2 (two) times daily. 09/23/19 12/01/19 Yes [name], MD  torsemide (DEMADEX) 20 MG tablet Take 2 tablets (40 mg total) by mouth 2 (two) times daily. 11/24/19  Yes Fritzi Mandes, MD    Review of Systems  Constitutional: Positive for fatigue. Negative for appetite change.  HENT: Positive for congestion. Negative for postnasal drip and  sore throat.   Eyes: Negative.   Respiratory: Positive for shortness of breath. Negative for cough and chest tightness.   Cardiovascular: Positive for chest pain ("just the usual"). Negative for palpitations and leg swelling.  Gastrointestinal: Negative for abdominal distention, abdominal pain, nausea and vomiting.  Endocrine: Negative.   Genitourinary: Negative.   Musculoskeletal: Positive for back pain.  Skin: Negative.   Allergic/Immunologic: Negative.   Neurological: Positive for light-headedness (on occasion) and numbness (down both legs at times). Negative for dizziness and headaches.  Hematological: Negative for adenopathy. Does not bruise/bleed easily.  Psychiatric/Behavioral: Positive for sleep disturbance. Negative for dysphoric mood and suicidal ideas. The patient is not nervous/anxious.    Vitals:   12/01/19 1339  BP:  131/79  Pulse: 77  Resp: 20  SpO2: 98%  Weight: 232 lb 6 oz (105.4 kg)  Height: 6' (1.829 m)   Wt Readings from Last 3 Encounters:  12/01/19 232 lb 6 oz (105.4 kg)  11/24/19 238 lb 9.6 oz (108.2 kg)  10/11/19 245 lb (111.1 kg)   Lab Results  Component Value Date   CREATININE 1.21 11/24/2019   CREATININE 1.27 (H) 11/23/2019   CREATININE 1.24 11/22/2019     Physical Exam  Constitutional: He is oriented to person, place, and time. He appears well-developed and well-nourished.  HENT:  Head: Normocephalic and atraumatic.  Neck: No JVD present.  Cardiovascular: Normal rate and regular rhythm.  Pulmonary/Chest: Effort normal. He has no wheezes. He has no rales. He exhibits no tenderness.  Abdominal: Soft. He exhibits no distension. There is no abdominal tenderness.  Musculoskeletal:        General: No tenderness or edema.     Cervical back: Normal range of motion and neck supple.  Neurological: He is alert and oriented to person, place, and time.  Skin: Skin is warm and dry.  Psychiatric: He has a normal mood and affect. His behavior is normal.  Thought content normal.  Nursing note and vitals reviewed.   Assessment & Plan:  1: Chronic heart failure with reduced ejection fraction- - NYHA class II - euvolemic today - weighing daily and reminded to call for an overnight weight gain of >2 pounds or a weekly weight gain of >5 pounds.  - weight down 18 pounds from last visit 2 months ago - not adding salt and he was encouraged to closely follow a 2000mg  sodium diet - saw cardiology Gabriel [name]) 10/11/19 - saw EP Gabriel [name]) 09/02/19; discussion about ICD but patient is currently uninsured - has been on higher metoprolol dose before but was quite fatigued - consider adding jardiance - discussed possible referral to [facility] but patient wants to hold off for now - BNP from 11/22/19 was 1698.0  2: HTN- - BP looks good today - saw PCP @ [facility] 04/07/2019 but does not want to return; he is trying to get set up at [place] from 11/24/19 reviewed and showed sodium 138, potassium 3.5, creatinine 1.21 and GFR >60  3: Diabetes-   -  A1c 8.7% on 11/23/19 - nonfasting glucose in clinic today was 204 after drinking coca-cola earlier - says that he doesn't want to take metformin due to nausea/ vomiting after every door; explained that he needed to follow-up with PCP regarding this - followed by [facility] - completed some classes at the [facility]   Patient did not bring his medications nor a list. Each medication was verbally reviewed with the patient and he was encouraged to bring the bottles to every visit to confirm accuracy of list.    Return in 3 months or sooner for any questions/problems before then.

## 2019-12-01 NOTE — Patient Instructions (Signed)
Continue weighing daily and call for an overnight weight gain of > 2 pounds or a weekly weight gain of >5 pounds. 

## 2019-12-11 ENCOUNTER — Emergency Department: Payer: Medicaid Other

## 2019-12-11 ENCOUNTER — Other Ambulatory Visit: Payer: Self-pay

## 2019-12-11 ENCOUNTER — Emergency Department
Admission: EM | Admit: 2019-12-11 | Discharge: 2019-12-12 | Disposition: A | Discharge status: Home / Self Care | Payer: Medicaid Other | Attending: Student | Admitting: Student

## 2019-12-11 DIAGNOSIS — N183 Chronic kidney disease, stage 3 unspecified: Secondary | ICD-10-CM | POA: Insufficient documentation

## 2019-12-11 DIAGNOSIS — Z79899 Other long term (current) drug therapy: Secondary | ICD-10-CM | POA: Insufficient documentation

## 2019-12-11 DIAGNOSIS — Z87891 Personal history of nicotine dependence: Secondary | ICD-10-CM | POA: Diagnosis not present

## 2019-12-11 DIAGNOSIS — M109 Gout, unspecified: Secondary | ICD-10-CM | POA: Insufficient documentation

## 2019-12-11 DIAGNOSIS — E1122 Type 2 diabetes mellitus with diabetic chronic kidney disease: Secondary | ICD-10-CM | POA: Diagnosis not present

## 2019-12-11 DIAGNOSIS — M25561 Pain in right knee: Secondary | ICD-10-CM | POA: Diagnosis present

## 2019-12-11 DIAGNOSIS — I5022 Chronic systolic (congestive) heart failure: Secondary | ICD-10-CM | POA: Diagnosis not present

## 2019-12-11 DIAGNOSIS — Z794 Long term (current) use of insulin: Secondary | ICD-10-CM | POA: Insufficient documentation

## 2019-12-11 DIAGNOSIS — Z7982 Long term (current) use of aspirin: Secondary | ICD-10-CM | POA: Insufficient documentation

## 2019-12-11 DIAGNOSIS — M7989 Other specified soft tissue disorders: Secondary | ICD-10-CM | POA: Insufficient documentation

## 2019-12-11 DIAGNOSIS — I130 Hypertensive heart and chronic kidney disease with heart failure and stage 1 through stage 4 chronic kidney disease, or unspecified chronic kidney disease: Secondary | ICD-10-CM | POA: Diagnosis not present

## 2019-12-11 LAB — CBC WITH DIFFERENTIAL/PLATELET
Abs Immature Granulocytes: 0.04 10*3/uL (ref 0.00–0.07)
Basophils Absolute: 0.1 10*3/uL (ref 0.0–0.1)
Basophils Relative: 1 %
Eosinophils Absolute: 0.1 10*3/uL (ref 0.0–0.5)
Eosinophils Relative: 1 %
HCT: 48.5 % (ref 39.0–52.0)
Hemoglobin: 15.9 g/dL (ref 13.0–17.0)
Immature Granulocytes: 0 %
Lymphocytes Relative: 19 %
Lymphs Abs: 1.8 10*3/uL (ref 0.7–4.0)
MCH: 27.7 pg (ref 26.0–34.0)
MCHC: 32.8 g/dL (ref 30.0–36.0)
MCV: 84.6 fL (ref 80.0–100.0)
Monocytes Absolute: 0.5 10*3/uL (ref 0.1–1.0)
Monocytes Relative: 5 %
Neutro Abs: 7 10*3/uL (ref 1.7–7.7)
Neutrophils Relative %: 74 %
Platelets: 142 10*3/uL — ABNORMAL LOW (ref 150–400)
RBC: 5.73 MIL/uL (ref 4.22–5.81)
RDW: 14.9 % (ref 11.5–15.5)
WBC: 9.4 10*3/uL (ref 4.0–10.5)
nRBC: 0 % (ref 0.0–0.2)

## 2019-12-11 LAB — COMPREHENSIVE METABOLIC PANEL
ALT: 20 U/L (ref 0–44)
AST: 26 U/L (ref 15–41)
Albumin: 3.4 g/dL — ABNORMAL LOW (ref 3.5–5.0)
Alkaline Phosphatase: 55 U/L (ref 38–126)
Anion gap: 12 (ref 5–15)
BUN: 24 mg/dL — ABNORMAL HIGH (ref 6–20)
CO2: 22 mmol/L (ref 22–32)
Calcium: 8.5 mg/dL — ABNORMAL LOW (ref 8.9–10.3)
Chloride: 102 mmol/L (ref 98–111)
Creatinine, Ser: 1.36 mg/dL — ABNORMAL HIGH (ref 0.61–1.24)
GFR calc Af Amer: >60 mL/min (ref >60)
GFR calc non Af Amer: >60 mL/min (ref >60)
Glucose, Bld: 336 mg/dL — ABNORMAL HIGH (ref 70–99)
Potassium: 3.2 mmol/L — ABNORMAL LOW (ref 3.5–5.1)
Sodium: 136 mmol/L (ref 135–145)
Total Bilirubin: 1.5 mg/dL — ABNORMAL HIGH (ref 0.3–1.2)
Total Protein: 6.5 g/dL (ref 6.5–8.1)

## 2019-12-11 LAB — URIC ACID: Uric Acid, Serum: 9.8 mg/dL — ABNORMAL HIGH (ref 3.7–8.6)

## 2019-12-11 MED ORDER — POTASSIUM CHLORIDE CRYS ER 20 MEQ PO TBCR
40.0000 meq | EXTENDED_RELEASE_TABLET | Freq: Once | ORAL | Status: AC
  Administered 2019-12-12: 40 meq via ORAL
  Filled 2019-12-11: qty 2

## 2019-12-11 MED ORDER — COLCHICINE 0.6 MG PO TABS: 0.6000 mg | ORAL_TABLET | Freq: Every day | ORAL | 0 refills | Status: DC

## 2019-12-11 MED ORDER — COLCHICINE 0.6 MG PO TABS
1.8000 mg | ORAL_TABLET | Freq: Once | ORAL | Status: AC
  Administered 2019-12-12: 1.8 mg via ORAL
  Filled 2019-12-11: qty 3

## 2019-12-11 NOTE — ED Provider Notes (Signed)
[facility] Emergency Department Provider Note  ____________________________________________  Time seen: Approximately 9:48 PM  I have reviewed the triage vital signs and the nursing notes.   HISTORY  Chief Complaint Knee Pain    HPI Gabriel [name]. is a 49 y.o. male who presents the emergency department complaining of nontraumatic right knee pain and swelling.  Patient states that over the past several days he has had increasing pain, swelling to the knee.  Patient denies any trauma to the area.  He denies any overlying erythema.   Patient states that the pain radiates along the medial thigh and he believes that there may be a "knot" in this region.  Patient does have a history of congestive heart failure, chronic kidney disease, COPD, diabetes, cardiomyopathy, coronary artery disease, hypertension.  Patient denies any complaints of chronic medical issues.  Patient denies any bilateral lower extremity edema.  Symptoms revolve around patient's knee and right thigh.  No history of DVT.  No reported trauma to the area.  Patient states that he does have a history of chronic pain to the knee from an old football injury.  However this is much more significant than his chronic knee pain.        Past Medical History:  Diagnosis Date  . Chest wall pain, chronic   . Chronic systolic CHF (congestive heart failure) ([place])    a. 03/2015 Echo: EF 45-50%; b. 12/2015 Echo: EF 20-25%; c. 02/2016 Echo: EF 30-35%; d. 11/2016 Echo: EF 40-45%; e. 06/2019 Echo: EF 30-35%.  . Chronic Troponin Elevation   . CKD (chronic kidney disease), stage III   . COPD (chronic obstructive pulmonary disease) ([place])   . Diabetes mellitus without complication ([place])   . Hypertension    Resolved since weight loss  . NICM (nonischemic cardiomyopathy) ([place])    a. 03/2015 Echo: EF 45-50%; b. 04/2015 MV: small defect of mild severity in apex 2/2 apical thinning, EF 30-44%;  c. 12/2015 Echo: EF 20-25%; d.  02/2016 Echo: EF 30-35%; e. 11/2016 Echo: EF 40-45%, diff HK, mildly dil Ao root, mild MR, mod dil LA;  f. 12/2016 Cath: LM nl, LAD/Diags/LCX/OMs min irregs, RCA 40p/m/d; g. 06/2019 Echo: EF 30-35%, glob HK. Mod dil LA. RVSP 58.4 mmHg.  . Non-obstructive CAD (coronary artery disease)    a. 04/2015 low risk MV;  b. 12/2016 Cath: minor irregs in LAD/Diag/LCX/OM, RCA 40p/m/d.  [name] (nonsustained ventricular tachycardia) ([place])    a. 12/2015 noted on tele-->amio;  b. 12/2015 Event monitor: no VT. Atach noted.  . Obesity (BMI 30.0-34.9)   . Psoriasis   . Renal insufficiency   . Syncope    a. 01/2016 - felt to be vasovagal.  . Tobacco abuse     Patient Active Problem List   Diagnosis Date Noted  . CHF exacerbation ([place]) 11/22/2019  . Acute CHF (congestive heart failure) ([place]) 06/26/2019  . CKD (chronic kidney disease), stage IIIa 06/26/2019  . Type II diabetes mellitus with renal manifestations ([place]) 06/26/2019  . Hypertensive urgency 06/26/2019  . Acute on chronic combined systolic and diastolic CHF (congestive heart failure) ([place]) 09/02/2017  . Chest wall pain, chronic 08/24/2017  . Dizziness   . Noncompliance with medications 04/29/2017  . Back pain 03/06/2017  . Tobacco use 12/04/2016  . Gallbladder sludge 12/04/2016  . Coronary artery disease, non-occlusive 03/15/2016  . Atypical chest pain 02/16/2016  . Orthostatic hypotension 01/23/2016  . Hypomagnesemia 01/23/2016  . NICM (nonischemic cardiomyopathy) ([place]) 01/17/2016  .  NSVT (nonsustained ventricular tachycardia) ([place])   . Elevated troponin   . Hyperlipidemia 05/17/2015  . COPD (chronic obstructive pulmonary disease) ([place]) 05/17/2015  . Essential hypertension 03/31/2015    Past Surgical History:  Procedure Laterality Date  . AMPUTATION    . CARDIAC CATHETERIZATION    . FINGER AMPUTATION     Traumatic  . FINGER FRACTURE SURGERY Left   . LEFT HEART CATH AND CORONARY ANGIOGRAPHY N/A 01/06/2017   Procedure: Left Heart Cath and  Coronary Angiography;  Surgeon: Wellington Hampshire, MD;  Location: [place] [facility];  Service: Cardiovascular;  Laterality: N/A;    Prior to Admission medications   Medication Sig Start Date End Date Taking? Authorizing Provider  aspirin 81 MG chewable tablet Chew 1 tablet (81 mg total) by mouth daily. Reported on 02/08/2016 08/27/18   Alisa Graff, FNP  cloNIDine (CATAPRES) 0.1 MG tablet Take 1 tablet (0.1 mg total) by mouth 2 (two) times daily. 12/01/19 12/31/19  Alisa Graff, FNP  colchicine 0.6 MG tablet Take 1 tablet (0.6 mg total) by mouth daily. Take 1 tab daily for at least 6 more days. If  Symptoms persist past 6 days continue to use until prescription is finished 12/11/19   Cuthriell, Roderic Palau D, PA-C  insulin detemir (LEVEMIR) 100 UNIT/ML injection Inject 0.24 mLs (24 Units total) into the skin at bedtime. 09/23/19 12/01/19  [name], MD  metoprolol (TOPROL XL) 200 MG 24 hr tablet Take 0.5 tablets (100 mg total) by mouth daily. 12/01/19 12/31/19  Alisa Graff, FNP  nitroGLYCERIN (NITROSTAT) 0.4 MG SL tablet Place 1 tablet (0.4 mg total) under the tongue every 5 (five) minutes x 3 doses as needed for chest pain. 06/27/19   [name], MD  rosuvastatin (CRESTOR) 20 MG tablet Take 1 tablet (20 mg total) by mouth daily. 12/01/19   Alisa Graff, FNP  sacubitril-valsartan (ENTRESTO) 97-103 MG Take 1 tablet by mouth 2 (two) times daily.    [provider]  spironolactone (ALDACTONE) 25 MG tablet Take 1 tablet (25 mg total) by mouth 2 (two) times daily. 12/01/19 12/31/19  Alisa Graff, FNP  torsemide (DEMADEX) 20 MG tablet Take 2 tablets (40 mg total) by mouth 2 (two) times daily. 12/01/19   Alisa Graff, FNP    Allergies Metformin and related, Prednisone, and Zantac [ranitidine hcl]  Family History  Problem Relation Age of Onset  . Diabetes Mellitus II Mother   . Hypertension Father   . Cancer Paternal Aunt   . Cancer Maternal Grandfather   . Diabetes Maternal Grandfather      Social History Social History   Tobacco Use  . Smoking status: Former Smoker    Packs/day: 0.50    Years: 33.00    Pack years: 16.50    Types: Cigarettes    Quit date: 08/18/2019    Years since quitting: 0.3  . Smokeless tobacco: Never Used  . Tobacco comment: Quit a little over a month ago.  Substance Use Topics  . Alcohol use: Yes    Alcohol/week: 0.0 standard drinks    Comment: occassionally  . Drug use: No     Review of Systems  Constitutional: No fever/chills Eyes: No visual changes. No discharge ENT: No upper respiratory complaints. Cardiovascular: no chest pain. Respiratory: no cough. No SOB. Gastrointestinal: No abdominal pain.  No nausea, no vomiting.  No diarrhea.  No constipation. Genitourinary: Negative for dysuria. No hematuria Musculoskeletal: Positive for right knee pain extending into the right  thigh. Skin: Negative for rash, abrasions, lacerations, ecchymosis. Neurological: Negative for headaches, focal weakness or numbness. 10-point ROS otherwise negative.  ____________________________________________   PHYSICAL EXAM:  VITAL SIGNS: ED Triage Vitals  Enc Vitals Group     BP 12/11/19 2122 (!) 148/94     Pulse Rate 12/11/19 2122 87     Resp 12/11/19 2122 20     Temp 12/11/19 2122 97.9 F (36.6 C)     Temp Source 12/11/19 2122 Oral     SpO2 12/11/19 2122 96 %     Weight 12/11/19 2122 245 lb (111.1 kg)     Height 12/11/19 2122 6' (1.829 m)     Head Circumference --      Peak Flow --      Pain Score 12/11/19 2125 10     Pain Loc --      Pain Edu? --      Excl. in [place]? --      Constitutional: Alert and oriented. Well appearing and in no acute distress. Eyes: Conjunctivae are normal. PERRL. EOMI. Head: Atraumatic. ENT:      Ears:       Nose: No congestion/rhinnorhea.      Mouth/Throat: Mucous membranes are moist.  Neck: No stridor.    Cardiovascular: Normal rate, regular rhythm. Normal S1 and S2.  Good peripheral  circulation. Respiratory: Normal respiratory effort without tachypnea or retractions. Lungs CTAB. Good air entry to the bases with no decreased or absent breath sounds. Musculoskeletal: Full range of motion to all extremities. No gross deformities appreciated.  Visualization of the right knee reveals no gross deformity, erythema, ecchymosis.  Good range of motion.  Patient with tenderness to palpation primarily in the suprapatellar region.  No palpable abnormality in this region.  Patient with mild edema of the right knee compared with left.  Extending along the medial thigh patient does have some tenderness.  No palpable lesions or cords.  Examination of the right hip is unremarkable.  Dorsalis pedis pulses sensation intact distally.  No gross identified pedal or lower extremity edema bilaterally. Neurologic:  Normal speech and language. No gross focal neurologic deficits are appreciated.  Skin:  Skin is warm, dry and intact. No rash noted. Psychiatric: Mood and affect are normal. Speech and behavior are normal. Patient exhibits appropriate insight and judgement.   ____________________________________________   LABS (all labs ordered are listed, but only abnormal results are displayed)  Labs Reviewed  URIC ACID - Abnormal; Notable for the following components:      Result Value   Uric Acid, Serum 9.8 (*)    All other components within normal limits  COMPREHENSIVE METABOLIC PANEL - Abnormal; Notable for the following components:   Potassium 3.2 (*)    Glucose, Bld 336 (*)    BUN 24 (*)    Creatinine, Ser 1.36 (*)    Calcium 8.5 (*)    Albumin 3.4 (*)    Total Bilirubin 1.5 (*)    All other components within normal limits  CBC WITH DIFFERENTIAL/PLATELET - Abnormal; Notable for the following components:   Platelets 142 (*)    All other components within normal limits   ____________________________________________  EKG   ____________________________________________  RADIOLOGY I  personally viewed and evaluated these images as part of my medical decision making, as well as reviewing the written report by the radiologist.  US Venous Img Lower Unilateral Right  Result Date: 12/11/2019 CLINICAL DATA:  Right knee and thigh pain, swelling EXAM: RIGHT LOWER EXTREMITY VENOUS  DOPPLER ULTRASOUND TECHNIQUE: Gray-scale sonography with compression, as well as color and duplex ultrasound, were performed to evaluate the deep venous system(s) from the level of the common femoral vein through the popliteal and proximal calf veins. COMPARISON:  None. FINDINGS: VENOUS Normal compressibility of the common femoral, superficial femoral, and popliteal veins, as well as the visualized calf veins. Visualized portions of profunda femoral vein and great saphenous vein unremarkable. No filling defects to suggest DVT on grayscale or color Doppler imaging. Doppler waveforms show normal direction of venous flow, normal respiratory phasicity and response to augmentation. Limited views of the contralateral common femoral vein are unremarkable. OTHER None. Limitations: Note is made of occlusive thrombus seen within the right greater saphenous vein in the mid to distal thigh. This does not extend into the deep venous system. IMPRESSION: 1. No evidence of deep venous thrombosis within the right lower extremity. 2. Superficial thrombus within the mid to distal right greater saphenous vein. Electronically Signed   By: Randa Ngo M.D.   On: 12/11/2019 23:14   DG Knee Complete 4 Views Right  Result Date: 12/11/2019 CLINICAL DATA:  49 year old male with right knee pain. No known injury. EXAM: RIGHT KNEE - COMPLETE 4+ VIEW COMPARISON:  None. FINDINGS: There is no acute fracture or dislocation. Irregular appearance of the superolateral patella, chronic. The bones are well mineralized. No arthritic changes. No joint effusion. The soft tissues are unremarkable. IMPRESSION: No acute fracture or dislocation. Electronically  Signed   By: Anner Crete M.D.   On: 12/11/2019 21:56    ____________________________________________    PROCEDURES  Procedure(s) performed:    Procedures    Medications  potassium chloride SA (KLOR-CON) CR tablet 40 mEq (has no administration in time range)  colchicine tablet 1.8 mg (has no administration in time range)     ____________________________________________   INITIAL IMPRESSION / ASSESSMENT AND PLAN / ED COURSE  Pertinent labs & imaging results that were available during my care of the patient were reviewed by me and considered in my medical decision making (see chart for details).  Review of the [place] CSRS was performed in accordance of the [name] prior to dispensing any controlled drugs.           Patient's diagnosis is consistent with gouty arthritis.  Patient presented to the emergency department complaining of right knee pain, swelling.  On physical exam, patient did have tenderness in the suprapatellar region.  No significant erythema, or warmth.  Knee was slightly edematous when compared with unaffected extremity.  Patient also had pain extending along the medial aspect of the thigh.  Given these findings patient was evaluated with x-ray, ultrasound, basic labs and uric acid.  Patient had a reassuring x-ray and ultrasound.  No evidence of fractures or DVT.  Patient's labs revealed no significant signs of infection.  Patient does have chronic kidney disease and creatinine was slightly elevated.  Patient's glucose was up slightly but he has not taken his nighttime dose of insulin.  In addition patient likely has mild stress hyperglycemia.  Gap is not elevated and no evidence of DKA.  Patient will take his nighttime dose of insulin when he goes home.  Patient's potassium was just slightly low as he takes torsemide, I will give the patient oral repletion here in the emergency department with 40 mEq of potassium and I did discuss dietary intake as well.  Patient's  uric acid was elevated consistent with gouty arthritic changes.  Patient is given colchicine for same.  Discussed dietary changes to help prevent future attacks.  Follow-up primary care as needed..Patient is given ED precautions to return to the ED for any worsening or new symptoms.     ____________________________________________  FINAL CLINICAL IMPRESSION(S) / ED DIAGNOSES  Final diagnoses:  Acute gout of right knee, unspecified cause      NEW MEDICATIONS STARTED DURING THIS VISIT:  ED Discharge Orders         Ordered    colchicine 0.6 MG tablet  Daily     12/11/19 2326              This chart was dictated using voice recognition software/Dragon. Despite best efforts to proofread, errors can occur which can change the meaning. Any change was purely unintentional.    Darletta Moll, PA-C 12/11/19 2329    Lilia Pro., MD 12/12/19 [phone]

## 2019-12-11 NOTE — ED Triage Notes (Signed)
Reports hx of right knee injury and chronic pain. Pt reports that since early this AM pt has had sever right knee pain and swelling. Knee pain radiates up into thigh. Pt unsure if related to chronic pain. Denies pain to posterior knee or calf.

## 2019-12-20 ENCOUNTER — Inpatient Hospital Stay
Admission: EM | Admit: 2019-12-20 | Discharge: 2019-12-23 | DRG: 291 | Disposition: A | Discharge status: Home / Self Care | Payer: Medicaid Other | Source: Ambulatory Visit | Attending: Internal Medicine | Admitting: Internal Medicine

## 2019-12-20 ENCOUNTER — Ambulatory Visit: Payer: Medicaid Other | Admitting: Family

## 2019-12-20 ENCOUNTER — Other Ambulatory Visit: Payer: Self-pay

## 2019-12-20 ENCOUNTER — Encounter: Payer: Self-pay | Admitting: Emergency Medicine

## 2019-12-20 ENCOUNTER — Encounter: Payer: Self-pay | Admitting: Family

## 2019-12-20 ENCOUNTER — Emergency Department: Payer: Medicaid Other

## 2019-12-20 VITALS — BP 165/117 | HR 89 | Resp 18 | Ht 72.0 in | Wt 254.5 lb

## 2019-12-20 DIAGNOSIS — E669 Obesity, unspecified: Secondary | ICD-10-CM | POA: Diagnosis present

## 2019-12-20 DIAGNOSIS — N183 Chronic kidney disease, stage 3 unspecified: Secondary | ICD-10-CM

## 2019-12-20 DIAGNOSIS — Z794 Long term (current) use of insulin: Secondary | ICD-10-CM | POA: Insufficient documentation

## 2019-12-20 DIAGNOSIS — F17210 Nicotine dependence, cigarettes, uncomplicated: Secondary | ICD-10-CM | POA: Diagnosis present

## 2019-12-20 DIAGNOSIS — Z833 Family history of diabetes mellitus: Secondary | ICD-10-CM | POA: Diagnosis not present

## 2019-12-20 DIAGNOSIS — Z6834 Body mass index (BMI) 34.0-34.9, adult: Secondary | ICD-10-CM | POA: Insufficient documentation

## 2019-12-20 DIAGNOSIS — Z79899 Other long term (current) drug therapy: Secondary | ICD-10-CM | POA: Insufficient documentation

## 2019-12-20 DIAGNOSIS — E785 Hyperlipidemia, unspecified: Secondary | ICD-10-CM | POA: Diagnosis present

## 2019-12-20 DIAGNOSIS — I10 Essential (primary) hypertension: Secondary | ICD-10-CM | POA: Diagnosis present

## 2019-12-20 DIAGNOSIS — Z720 Tobacco use: Secondary | ICD-10-CM | POA: Diagnosis present

## 2019-12-20 DIAGNOSIS — Z7982 Long term (current) use of aspirin: Secondary | ICD-10-CM | POA: Insufficient documentation

## 2019-12-20 DIAGNOSIS — E1122 Type 2 diabetes mellitus with diabetic chronic kidney disease: Secondary | ICD-10-CM | POA: Diagnosis present

## 2019-12-20 DIAGNOSIS — L409 Psoriasis, unspecified: Secondary | ICD-10-CM | POA: Insufficient documentation

## 2019-12-20 DIAGNOSIS — R109 Unspecified abdominal pain: Secondary | ICD-10-CM

## 2019-12-20 DIAGNOSIS — I4581 Long QT syndrome: Secondary | ICD-10-CM | POA: Diagnosis present

## 2019-12-20 DIAGNOSIS — I2510 Atherosclerotic heart disease of native coronary artery without angina pectoris: Secondary | ICD-10-CM | POA: Insufficient documentation

## 2019-12-20 DIAGNOSIS — I509 Heart failure, unspecified: Secondary | ICD-10-CM

## 2019-12-20 DIAGNOSIS — J449 Chronic obstructive pulmonary disease, unspecified: Secondary | ICD-10-CM | POA: Diagnosis present

## 2019-12-20 DIAGNOSIS — N1831 Chronic kidney disease, stage 3a: Secondary | ICD-10-CM

## 2019-12-20 DIAGNOSIS — J9601 Acute respiratory failure with hypoxia: Secondary | ICD-10-CM | POA: Diagnosis present

## 2019-12-20 DIAGNOSIS — Z20822 Contact with and (suspected) exposure to covid-19: Secondary | ICD-10-CM | POA: Diagnosis present

## 2019-12-20 DIAGNOSIS — I130 Hypertensive heart and chronic kidney disease with heart failure and stage 1 through stage 4 chronic kidney disease, or unspecified chronic kidney disease: Secondary | ICD-10-CM | POA: Diagnosis not present

## 2019-12-20 DIAGNOSIS — Z8249 Family history of ischemic heart disease and other diseases of the circulatory system: Secondary | ICD-10-CM | POA: Insufficient documentation

## 2019-12-20 DIAGNOSIS — J441 Chronic obstructive pulmonary disease with (acute) exacerbation: Secondary | ICD-10-CM | POA: Diagnosis present

## 2019-12-20 DIAGNOSIS — I472 Ventricular tachycardia: Secondary | ICD-10-CM | POA: Diagnosis not present

## 2019-12-20 DIAGNOSIS — I428 Other cardiomyopathies: Secondary | ICD-10-CM | POA: Diagnosis present

## 2019-12-20 DIAGNOSIS — Z91148 Patient's other noncompliance with medication regimen for other reason: Secondary | ICD-10-CM

## 2019-12-20 DIAGNOSIS — R0602 Shortness of breath: Secondary | ICD-10-CM | POA: Diagnosis not present

## 2019-12-20 DIAGNOSIS — Z6832 Body mass index (BMI) 32.0-32.9, adult: Secondary | ICD-10-CM

## 2019-12-20 DIAGNOSIS — I160 Hypertensive urgency: Secondary | ICD-10-CM | POA: Diagnosis present

## 2019-12-20 DIAGNOSIS — I5043 Acute on chronic combined systolic (congestive) and diastolic (congestive) heart failure: Secondary | ICD-10-CM | POA: Diagnosis present

## 2019-12-20 DIAGNOSIS — K7460 Unspecified cirrhosis of liver: Secondary | ICD-10-CM | POA: Diagnosis present

## 2019-12-20 DIAGNOSIS — E1165 Type 2 diabetes mellitus with hyperglycemia: Secondary | ICD-10-CM | POA: Diagnosis present

## 2019-12-20 DIAGNOSIS — Z888 Allergy status to other drugs, medicaments and biological substances status: Secondary | ICD-10-CM | POA: Insufficient documentation

## 2019-12-20 DIAGNOSIS — M940 Chondrocostal junction syndrome [Tietze]: Secondary | ICD-10-CM | POA: Diagnosis present

## 2019-12-20 DIAGNOSIS — G8929 Other chronic pain: Secondary | ICD-10-CM | POA: Insufficient documentation

## 2019-12-20 DIAGNOSIS — I5023 Acute on chronic systolic (congestive) heart failure: Secondary | ICD-10-CM

## 2019-12-20 DIAGNOSIS — Z89029 Acquired absence of unspecified finger(s): Secondary | ICD-10-CM | POA: Diagnosis not present

## 2019-12-20 DIAGNOSIS — Z87891 Personal history of nicotine dependence: Secondary | ICD-10-CM | POA: Insufficient documentation

## 2019-12-20 DIAGNOSIS — Z9114 Patient's other noncompliance with medication regimen: Secondary | ICD-10-CM

## 2019-12-20 DIAGNOSIS — E119 Type 2 diabetes mellitus without complications: Secondary | ICD-10-CM

## 2019-12-20 LAB — CBC WITH DIFFERENTIAL/PLATELET
Abs Immature Granulocytes: 0.05 10*3/uL (ref 0.00–0.07)
Basophils Absolute: 0.1 10*3/uL (ref 0.0–0.1)
Basophils Relative: 1 %
Eosinophils Absolute: 0.1 10*3/uL (ref 0.0–0.5)
Eosinophils Relative: 1 %
HCT: 50.4 % (ref 39.0–52.0)
Hemoglobin: 16.7 g/dL (ref 13.0–17.0)
Immature Granulocytes: 0 %
Lymphocytes Relative: 21 %
Lymphs Abs: 2.5 10*3/uL (ref 0.7–4.0)
MCH: 27.6 pg (ref 26.0–34.0)
MCHC: 33.1 g/dL (ref 30.0–36.0)
MCV: 83.3 fL (ref 80.0–100.0)
Monocytes Absolute: 0.7 10*3/uL (ref 0.1–1.0)
Monocytes Relative: 6 %
Neutro Abs: 8.5 10*3/uL — ABNORMAL HIGH (ref 1.7–7.7)
Neutrophils Relative %: 71 %
Platelets: 165 10*3/uL (ref 150–400)
RBC: 6.05 MIL/uL — ABNORMAL HIGH (ref 4.22–5.81)
RDW: 14.8 % (ref 11.5–15.5)
WBC: 11.9 10*3/uL — ABNORMAL HIGH (ref 4.0–10.5)
nRBC: 0 % (ref 0.0–0.2)

## 2019-12-20 LAB — COMPREHENSIVE METABOLIC PANEL
ALT: 20 U/L (ref 0–44)
AST: 20 U/L (ref 15–41)
Albumin: 3.4 g/dL — ABNORMAL LOW (ref 3.5–5.0)
Alkaline Phosphatase: 56 U/L (ref 38–126)
Anion gap: 10 (ref 5–15)
BUN: 23 mg/dL — ABNORMAL HIGH (ref 6–20)
CO2: 22 mmol/L (ref 22–32)
Calcium: 8.7 mg/dL — ABNORMAL LOW (ref 8.9–10.3)
Chloride: 105 mmol/L (ref 98–111)
Creatinine, Ser: 1.24 mg/dL (ref 0.61–1.24)
GFR calc Af Amer: >60 mL/min (ref >60)
GFR calc non Af Amer: >60 mL/min (ref >60)
Glucose, Bld: 209 mg/dL — ABNORMAL HIGH (ref 70–99)
Potassium: 4.1 mmol/L (ref 3.5–5.1)
Sodium: 137 mmol/L (ref 135–145)
Total Bilirubin: 3.4 mg/dL — ABNORMAL HIGH (ref 0.3–1.2)
Total Protein: 6.6 g/dL (ref 6.5–8.1)

## 2019-12-20 LAB — RESPIRATORY PANEL BY RT PCR (FLU A&B, COVID)
Influenza A by PCR: NEGATIVE
Influenza B by PCR: NEGATIVE
SARS Coronavirus 2 by RT PCR: NEGATIVE

## 2019-12-20 LAB — GLUCOSE, CAPILLARY
Glucose-Capillary: 211 mg/dL — ABNORMAL HIGH (ref 70–99)
Glucose-Capillary: 160 mg/dL — ABNORMAL HIGH (ref 70–99)
Glucose-Capillary: 159 mg/dL — ABNORMAL HIGH (ref 70–99)

## 2019-12-20 LAB — TROPONIN I (HIGH SENSITIVITY)
Troponin I (High Sensitivity): 61 ng/L — ABNORMAL HIGH (ref <18)
Troponin I (High Sensitivity): 62 ng/L — ABNORMAL HIGH (ref <18)

## 2019-12-20 LAB — BRAIN NATRIURETIC PEPTIDE: B Natriuretic Peptide: 2920 pg/mL — ABNORMAL HIGH (ref 0.0–100.0)

## 2019-12-20 MED ORDER — BENZONATATE 100 MG PO CAPS
200.0000 mg | ORAL_CAPSULE | Freq: Three times a day (TID) | ORAL | Status: DC
  Administered 2019-12-20 – 2019-12-22 (×5): 200 mg via ORAL
  Filled 2019-12-20 (×6): qty 2

## 2019-12-20 MED ORDER — SODIUM CHLORIDE 0.9% FLUSH
3.0000 mL | Freq: Two times a day (BID) | INTRAVENOUS | Status: DC
  Administered 2019-12-20 – 2019-12-23 (×5): 3 mL via INTRAVENOUS

## 2019-12-20 MED ORDER — SACUBITRIL-VALSARTAN 97-103 MG PO TABS
1.0000 | ORAL_TABLET | Freq: Two times a day (BID) | ORAL | Status: DC
  Administered 2019-12-20 – 2019-12-23 (×6): 1 via ORAL
  Filled 2019-12-20 (×11): qty 1

## 2019-12-20 MED ORDER — INSULIN ASPART 100 UNIT/ML ~~LOC~~ SOLN
0.0000 [IU] | Freq: Three times a day (TID) | SUBCUTANEOUS | Status: DC
  Administered 2019-12-20: 18:00:00 3 [IU] via SUBCUTANEOUS
  Administered 2019-12-21: 09:00:00 2 [IU] via SUBCUTANEOUS
  Administered 2019-12-21: 11 [IU] via SUBCUTANEOUS
  Administered 2019-12-21 – 2019-12-22 (×2): 3 [IU] via SUBCUTANEOUS
  Administered 2019-12-22: 4 [IU] via SUBCUTANEOUS
  Administered 2019-12-22: 11 [IU] via SUBCUTANEOUS
  Administered 2019-12-23: 5 [IU] via SUBCUTANEOUS
  Administered 2019-12-23: 14:00:00 11 [IU] via SUBCUTANEOUS
  Filled 2019-12-20 (×9): qty 1

## 2019-12-20 MED ORDER — FUROSEMIDE 10 MG/ML IJ SOLN
60.0000 mg | Freq: Once | INTRAMUSCULAR | Status: AC
  Administered 2019-12-20: 60 mg via INTRAVENOUS
  Filled 2019-12-20: qty 8

## 2019-12-20 MED ORDER — OXYCODONE-ACETAMINOPHEN 5-325 MG PO TABS
1.0000 | ORAL_TABLET | Freq: Once | ORAL | Status: AC
  Administered 2019-12-20: 1 via ORAL
  Filled 2019-12-20: qty 1

## 2019-12-20 MED ORDER — SODIUM CHLORIDE 0.9 % IV SOLN: 250.0000 mL | INTRAVENOUS | Status: DC | PRN

## 2019-12-20 MED ORDER — NITROGLYCERIN 0.4 MG SL SUBL
0.4000 mg | SUBLINGUAL_TABLET | SUBLINGUAL | Status: DC | PRN
  Filled 2019-12-20: qty 1

## 2019-12-20 MED ORDER — SPIRONOLACTONE 25 MG PO TABS
25.0000 mg | ORAL_TABLET | Freq: Two times a day (BID) | ORAL | Status: DC
  Administered 2019-12-20 – 2019-12-23 (×6): 25 mg via ORAL
  Filled 2019-12-20 (×7): qty 1

## 2019-12-20 MED ORDER — INSULIN ASPART 100 UNIT/ML ~~LOC~~ SOLN
4.0000 [IU] | Freq: Three times a day (TID) | SUBCUTANEOUS | Status: DC
  Administered 2019-12-20 – 2019-12-22 (×5): 4 [IU] via SUBCUTANEOUS
  Filled 2019-12-20 (×5): qty 1

## 2019-12-20 MED ORDER — ASPIRIN 81 MG PO CHEW
81.0000 mg | CHEWABLE_TABLET | Freq: Every day | ORAL | Status: DC
  Administered 2019-12-21 – 2019-12-23 (×3): 81 mg via ORAL
  Filled 2019-12-20 (×4): qty 1

## 2019-12-20 MED ORDER — CLONIDINE HCL 0.1 MG PO TABS
0.1000 mg | ORAL_TABLET | Freq: Two times a day (BID) | ORAL | Status: DC
  Administered 2019-12-20: 0.1 mg via ORAL
  Filled 2019-12-20 (×2): qty 1

## 2019-12-20 MED ORDER — KETOROLAC TROMETHAMINE 30 MG/ML IJ SOLN
INTRAMUSCULAR | Status: AC
  Administered 2019-12-20: 15 mg via INTRAVENOUS
  Filled 2019-12-20: qty 1

## 2019-12-20 MED ORDER — FUROSEMIDE 10 MG/ML IJ SOLN
40.0000 mg | Freq: Two times a day (BID) | INTRAMUSCULAR | Status: DC
  Administered 2019-12-20 – 2019-12-21 (×2): 40 mg via INTRAVENOUS
  Filled 2019-12-20 (×2): qty 4

## 2019-12-20 MED ORDER — ACETAMINOPHEN 325 MG PO TABS
650.0000 mg | ORAL_TABLET | ORAL | Status: DC | PRN
  Administered 2019-12-22 (×2): 650 mg via ORAL
  Filled 2019-12-20 (×2): qty 2

## 2019-12-20 MED ORDER — METOPROLOL SUCCINATE ER 100 MG PO TB24
100.0000 mg | ORAL_TABLET | Freq: Every day | ORAL | Status: DC
  Administered 2019-12-20: 100 mg via ORAL
  Filled 2019-12-20: qty 2
  Filled 2019-12-20: qty 1

## 2019-12-20 MED ORDER — ENOXAPARIN SODIUM 40 MG/0.4ML ~~LOC~~ SOLN
40.0000 mg | SUBCUTANEOUS | Status: DC
  Administered 2019-12-20 – 2019-12-22 (×3): 40 mg via SUBCUTANEOUS
  Filled 2019-12-20 (×3): qty 0.4

## 2019-12-20 MED ORDER — KETOROLAC TROMETHAMINE 30 MG/ML IJ SOLN: 15.0000 mg | Freq: Once | INTRAMUSCULAR | Status: AC

## 2019-12-20 MED ORDER — IPRATROPIUM-ALBUTEROL 0.5-2.5 (3) MG/3ML IN SOLN
3.0000 mL | Freq: Once | RESPIRATORY_TRACT | Status: AC
  Administered 2019-12-20: 3 mL via RESPIRATORY_TRACT
  Filled 2019-12-20: qty 3

## 2019-12-20 MED ORDER — ROSUVASTATIN CALCIUM 10 MG PO TABS
20.0000 mg | ORAL_TABLET | Freq: Every day | ORAL | Status: DC
  Administered 2019-12-20 – 2019-12-22 (×3): 20 mg via ORAL
  Filled 2019-12-20: qty 2
  Filled 2019-12-20: qty 1
  Filled 2019-12-20: qty 2

## 2019-12-20 MED ORDER — COLCHICINE 0.6 MG PO TABS
0.6000 mg | ORAL_TABLET | Freq: Every day | ORAL | Status: DC
  Administered 2019-12-20 – 2019-12-23 (×4): 0.6 mg via ORAL
  Filled 2019-12-20 (×5): qty 1

## 2019-12-20 MED ORDER — ONDANSETRON HCL 4 MG/2ML IJ SOLN: 4.0000 mg | Freq: Four times a day (QID) | INTRAMUSCULAR | Status: DC | PRN

## 2019-12-20 MED ORDER — SODIUM CHLORIDE 0.9% FLUSH: 3.0000 mL | INTRAVENOUS | Status: DC | PRN

## 2019-12-20 NOTE — ED Notes (Signed)
Ate 100% of meal tray.

## 2019-12-20 NOTE — ED Notes (Signed)
Pt given lunch tray. Requested that RN contact and update his father.  Pt reports he takes medications differently at home than as ordered in [facility]. Sent message to pharmacy to reflect pt requested changes.

## 2019-12-20 NOTE — ED Notes (Signed)
Pt requesting lights dimmed and quiet time as he would like to rest. Environment adjusted per patient preferences.

## 2019-12-20 NOTE — ED Notes (Signed)
Report to Steve, RN

## 2019-12-20 NOTE — ED Notes (Signed)
X-ray at bedside

## 2019-12-20 NOTE — Consult Note (Signed)
Cardiology Consultation:   Patient ID: Gabriel Virola.; [id number]; 01-11-1971   Admit date: 12/20/2019 Date of Consult: 12/20/2019  Primary Care Provider: Alisa Graff, [place] Primary Cardiologist: Rockey Situ Primary Electrophysiologist:  Caryl Comes   Patient Profile:   Gabriel [name]. is a 49 y.o. 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 Francine Graven.  History of Present Illness:   Gabriel [name] has a cardiomyopathy that dates back to 03/2015 at which time he was found to have an EF of 45 to 50%.  Subsequent nuclear stress test in 2016 demonstrated no evidence of significant perfusion abnormalities and likely nonischemic cardiomyopathy.  His EF worsened in 12/2015 with an EF of 20 to 25% at which time he was diagnosed with Neisseria pneumonia/bacteremia.  Repeat echo in 11/2016 showed an improved LV systolic function with an EF of 40 to 45%.  He ultimately underwent diagnostic cath in 12/2016 which showed mild nonobstructive CAD as outlined below with an EF of 30 to 35%.  He was admitted in 06/2019 with worsening dyspnea with echo demonstrating an EF of 30 to 35% with diffuse hypokinesis at that time.  He was diuresed from 245 pounds to 238 pounds.  He was evaluated by EP in 08/2019 for consideration of ICD which the patient preferred to defer until obtaining medical insurance.  He was admitted again in 09/2019 with volume overload in the setting of being off all cardiac medications for approximately 1 week with symptoms improving with IV diuresis.  He has previously not tolerated high-dose Toprol-XL secondary to fatigue though has been maintained on GDMT including Toprol-XL, Entresto, spironolactone, and torsemide.  He was admitted in early 11/2019 with volume overload,  possibly exacerbated by medication self-titration. He diuresed nicely with IV Lasix and was discharged on torsemide 40 mg bid along with his remaining GDMT with a documented discharge weight of 248 pounds. He was seen in the [facility] CHF clinic in follow up on 12/01/2019 with a documented weight of 232 pounds. He presented as a walk-in to the CHF clinic this morning with a 4-day history of worsening shortness of breath and orthopnea.  He reports on 4/29 he developed significant shortness of breath.  With this, he took an extra dose of torsemide without much improvement in his symptoms.  He denies missing any medications or any changes in his diet.  He has been unable to sleep for the past 3 to 4 days secondary to apneic episodes.  At baseline, he has 3 pillow orthopnea.  He notes some abdominal distention and mild lower extremity swelling.  There is some bilateral chest tightness.  Upon his arrival to the [facility] CHF clinic this morning his weight was up 22 pounds by their scale when compared to his visit from 12/02/2019. In this setting, he was transferred to the ED.   Upon the patient's arrival to [facility] they were found to have BP in the [id number] to [id number] systolic, HR 123XX123 bpm, temp afebrile, oxygen saturation 88% on room air requiring supplemental oxygen via nasal cannula at 2L, weight 254 pounds. EKG showed NSR, 88 bpm, right axis deviation, rare PVC, nonspecific st/t changes, CXR showed cardiomegaly with pulmonary vascular congestion and lower lung interstitial edema. Labs showed a BNP of 2920 (  1698 four weeks prior), HS-Tn 61 with a delta that is pending, BUN/SCr 23/1.24, potassium 4.1, albumin 3.4, WBC 11.9, HGB 16.7. In the ED he received IV Lasix 60 mg along with Duoneb. Currently, remains dyspneic on supplemental oxygen at 2 L.  He has diuresed 1.2 L in the ED so far with the above IV Lasix.  Past Medical History:  Diagnosis Date  . Chest wall pain, chronic   . Chronic systolic CHF (congestive heart  failure) ([place])    a. 03/2015 Echo: EF 45-50%; b. 12/2015 Echo: EF 20-25%; c. 02/2016 Echo: EF 30-35%; d. 11/2016 Echo: EF 40-45%; e. 06/2019 Echo: EF 30-35%.  . Chronic Troponin Elevation   . CKD (chronic kidney disease), stage III   . COPD (chronic obstructive pulmonary disease) ([place])   . Diabetes mellitus without complication ([place])   . Hypertension    Resolved since weight loss  . NICM (nonischemic cardiomyopathy) ([place])    a. 03/2015 Echo: EF 45-50%; b. 04/2015 MV: small defect of mild severity in apex 2/2 apical thinning, EF 30-44%;  c. 12/2015 Echo: EF 20-25%; d. 02/2016 Echo: EF 30-35%; e. 11/2016 Echo: EF 40-45%, diff HK, mildly dil Ao root, mild MR, mod dil LA;  f. 12/2016 Cath: LM nl, LAD/Diags/LCX/OMs min irregs, RCA 40p/m/d; g. 06/2019 Echo: EF 30-35%, glob HK. Mod dil LA. RVSP 58.4 mmHg.  . Non-obstructive CAD (coronary artery disease)    a. 04/2015 low risk MV;  b. 12/2016 Cath: minor irregs in LAD/Diag/LCX/OM, RCA 40p/m/d.  [name] (nonsustained ventricular tachycardia) ([place])    a. 12/2015 noted on tele-->amio;  b. 12/2015 Event monitor: no VT. Atach noted.  . Obesity (BMI 30.0-34.9)   . Psoriasis   . Renal insufficiency   . Syncope    a. 01/2016 - felt to be vasovagal.  . Tobacco abuse     Past Surgical History:  Procedure Laterality Date  . AMPUTATION    . CARDIAC CATHETERIZATION    . FINGER AMPUTATION     Traumatic  . FINGER FRACTURE SURGERY Left   . LEFT HEART CATH AND CORONARY ANGIOGRAPHY N/A 01/06/2017   Procedure: Left Heart Cath and Coronary Angiography;  Surgeon: Wellington Hampshire, MD;  Location: [facility];  Service: Cardiovascular;  Laterality: N/A;     Home Meds: Prior to Admission medications   Medication Sig Start Date End Date Taking? Authorizing Provider  aspirin 81 MG chewable tablet Chew 1 tablet (81 mg total) by mouth daily. Reported on 02/08/2016 08/27/18   Alisa Graff, FNP  cloNIDine (CATAPRES) 0.1 MG tablet Take 1 tablet (0.1 mg total) by mouth 2  (two) times daily. 12/01/19 12/31/19  Alisa Graff, FNP  colchicine 0.6 MG tablet Take 1 tablet (0.6 mg total) by mouth daily. Take 1 tab daily for at least 6 more days. If  Symptoms persist past 6 days continue to use until prescription is finished 12/11/19   Cuthriell, Roderic Palau D, PA-C  insulin detemir (LEVEMIR) 100 UNIT/ML injection Inject 0.24 mLs (24 Units total) into the skin at bedtime. 09/23/19 12/20/19  [name], MD  metoprolol (TOPROL XL) 200 MG 24 hr tablet Take 0.5 tablets (100 mg total) by mouth daily. 12/01/19 12/31/19  Alisa Graff, FNP  nitroGLYCERIN (NITROSTAT) 0.4 MG SL tablet Place 1 tablet (0.4 mg total) under the tongue every 5 (five) minutes x 3 doses as needed for chest pain. 06/27/19   [name], MD  rosuvastatin (CRESTOR) 20 MG tablet Take 1 tablet (20 mg  total) by mouth daily. 12/01/19   Alisa Graff, FNP  sacubitril-valsartan (ENTRESTO) 97-103 MG Take 1 tablet by mouth 2 (two) times daily.    [provider]  spironolactone (ALDACTONE) 25 MG tablet Take 1 tablet (25 mg total) by mouth 2 (two) times daily. 12/01/19 12/31/19  Alisa Graff, FNP  torsemide (DEMADEX) 20 MG tablet Take 2 tablets (40 mg total) by mouth 2 (two) times daily. 12/01/19   Alisa Graff, FNP    Inpatient Medications: Scheduled Meds:  Continuous Infusions:  PRN Meds:   Allergies:   Allergies  Allergen Reactions  . Metformin And Related Nausea And Vomiting  . Prednisone Other (See Comments)    Reaction: Hallucinations    . Zantac [Ranitidine Hcl] Diarrhea and Nausea Only    Night sweats    Social History:   Social History   Socioeconomic History  . Marital status: Widowed    Spouse name: Not on file  . Number of children: 1  . Years of education: 42  . Highest education level: 12th grade  Occupational History  . Occupation: disabled  Tobacco Use  . Smoking status: Former Smoker    Packs/day: 0.50    Years: 33.00    Pack years: 16.50    Types: Cigarettes    Quit  date: 08/18/2019    Years since quitting: 0.3  . Smokeless tobacco: Never Used  . Tobacco comment: Quit a little over a month ago.  Substance and Sexual Activity  . Alcohol use: Yes    Alcohol/week: 0.0 standard drinks    Comment: occassionally  . Drug use: No  . Sexual activity: Not Currently  Other Topics Concern  . Not on file  Social History Narrative      Social Determinants of Health   Financial Resource Strain:   . Difficulty of Paying Living Expenses:   Food Insecurity:   . Worried About Charity fundraiser in the Last Year:   . Arboriculturist in the Last Year:   Transportation Needs:   . Film/video editor (Medical):   Marland Kitchen Lack of Transportation (Non-Medical):   Physical Activity:   . Days of Exercise per Week:   . Minutes of Exercise per Session:   Stress:   . Feeling of Stress :   Social Connections:   . Frequency of Communication with Friends and Family:   . Frequency of Social Gatherings with Friends and Family:   . Attends Religious Services:   . Active Member of Clubs or Organizations:   . Attends Archivist Meetings:   Marland Kitchen Marital Status:   Intimate Partner Violence:   . Fear of Current or Ex-Partner:   . Emotionally Abused:   Marland Kitchen Physically Abused:   . Sexually Abused:      Family History:   Family History  Problem Relation Age of Onset  . Diabetes Mellitus II Mother   . Hypertension Father   . Cancer Paternal Aunt   . Cancer Maternal Grandfather   . Diabetes Maternal Grandfather     ROS:  Review of Systems  Constitutional: Positive for malaise/fatigue. Negative for chills, diaphoresis, fever and weight loss.  HENT: Negative for congestion.   Eyes: Negative for discharge and redness.  Respiratory: Positive for shortness of breath. Negative for cough, sputum production and wheezing.   Cardiovascular: Positive for chest pain, orthopnea and leg swelling. Negative for palpitations, claudication and PND.       Chest tightness    Gastrointestinal:  Negative for abdominal pain, heartburn, nausea and vomiting.       Abdominal distention  Musculoskeletal: Negative for falls and myalgias.  Skin: Negative for rash.  Neurological: Positive for weakness. Negative for dizziness, tingling, tremors, sensory change, speech change, focal weakness and loss of consciousness.  Endo/Heme/Allergies: Does not bruise/bleed easily.  Psychiatric/Behavioral: Negative for substance abuse. The patient is not nervous/anxious.   All other systems reviewed and are negative.     Physical Exam/Data:   Vitals:   12/20/19 0851 12/20/19 0910 12/20/19 0930 12/20/19 1000  BP:   (!) 151/113 (!) 144/105  Pulse:   80   Resp:   (!) 24 (!) 26  Temp:      TempSrc:      SpO2: 98%  94%   Weight:  115.4 kg    Height:  6' (1.829 m)      Intake/Output Summary (Last 24 hours) at 12/20/2019 1015 Last data filed at 12/20/2019 1007 Gross per 24 hour  Intake --  Output 800 ml  Net -800 ml   Filed Weights   12/20/19 0910  Weight: 115.4 kg   Body mass index is 34.52 kg/m.   Physical Exam: General: Well developed, well nourished, in no acute distress. Head: Normocephalic, atraumatic, sclera non-icteric, no xanthomas, nares without discharge.  Neck: Negative for carotid bruits. JVD elevated ~ 11 cm. Lungs: Diminished breath sounds along the bilateral bases with bilateral crackles.  Breathing is unlabored on supplemental oxygen via nasal cannula 2 L. Heart: RRR with S1 S2. No murmurs, rubs, or gallops appreciated. Abdomen: Soft, non-tender, mildly distended with normoactive bowel sounds. No hepatomegaly. No rebound/guarding. No obvious abdominal masses. Msk:  Strength and tone appear normal for age. Extremities: No clubbing or cyanosis.  Trace bilateral pretibial edema. Distal pedal pulses are 2+ and equal bilaterally. Neuro: Alert and oriented X 3. No facial asymmetry. No focal deficit. Moves all extremities spontaneously. Psych:  Responds to  questions appropriately with a normal affect.   EKG:  The EKG was personally reviewed and demonstrates: NSR, 88 bpm, right axis deviation, rare PVC, nonspecific st/t changes Telemetry:  Telemetry was personally reviewed and demonstrates: SR with PACs  Weights: Filed Weights   12/20/19 0910  Weight: 115.4 kg    Relevant CV Studies:  2D echo 11/23/2019: 1. Left ventricular ejection fraction, by estimation, is 25 to 30%. The  left ventricle has severely decreased function. The left ventricle  demonstrates global hypokinesis. The left ventricular internal cavity size  was mildly dilated. There is mild left  ventricular hypertrophy. Left ventricular diastolic parameters are  consistent with Grade III diastolic dysfunction (restrictive). Elevated  left atrial pressure.  2. Right ventricular systolic function is moderately reduced. The right  ventricular size is mildly enlarged. There is moderately elevated  pulmonary artery systolic pressure.  3. Left atrial size was mild to moderately dilated.  4. Right atrial size was mildly dilated.  5. The mitral valve is normal in structure. Trivial mitral valve  regurgitation. No evidence of mitral stenosis.  6. The aortic valve is tricuspid. Aortic valve regurgitation is mild to  moderate. Mild aortic valve sclerosis is present, with no evidence of  aortic valve stenosis.  7. Aortic dilatation noted. There is borderline dilatation of the aortic  root measuring 38 mm.  8. The inferior vena cava is dilated in size with <50% respiratory  variability, suggesting right atrial pressure of 15 mmHg.  __________  2D echo 06/2019: 1. Left ventricular ejection fraction, by visual estimation,  is 30 to  35%. The left ventricle has moderately decreased function. There is no  left ventricular hypertrophy.  2. Definity contrast agent was given IV to delineate the left ventricular  endocardial borders.  3. The left ventricle demonstrates global  hypokinesis.  4. Global right ventricle has normal systolic function.The right  ventricular size is mildly enlarged. No increase in right ventricular wall  thickness.  5. Left atrial size was moderately dilated.  6. Moderately elevated pulmonary artery systolic pressure.  7. The tricuspid regurgitant velocity is 3.10 m/s, and with an assumed  right atrial pressure of 20 mmHg, the estimated right ventricular systolic  pressure is moderately elevated at 58.4 mmHg.  8. Moderately dilated left ventricular internal cavity size. __________  2D echo 04/2017: - Left ventricle: The cavity size was normal. Wall thickness was  increased in a pattern of mild LVH. Systolic function was mildly  reduced. The estimated ejection fraction was in the range of 45%  to 50%. Doppler parameters are consistent with abnormal left  ventricular relaxation (grade 1 diastolic dysfunction).  - Aortic valve: Probably trileaflet; mildly thickened, mildly  calcified leaflets. There was mild regurgitation directed  eccentrically in the LVOT.  - Left atrium: The atrium was mildly dilated.  - Right ventricle: The cavity size was normal. Systolic function  was normal.  - Pericardium, extracardiac: A trivial pericardial effusion was  identified posterior to the heart.  __________  [facility] 12/2016:  There is moderate to severe left ventricular systolic dysfunction.  LV end diastolic pressure is moderately elevated.  The left ventricular ejection fraction is 25-35% by visual estimate.  Dist RCA lesion, 40 %stenosed.  Prox RCA to Mid RCA lesion, 40 %stenosed.   1. Mild nonobstructive coronary artery disease. 2. Moderately to severely reduced LV systolic function with an EF of 30-35%. 3. Moderately elevated left ventricular end-diastolic pressure. LVEDP was in the high 20s.  Recommendations: Continue medical therapy for nonobstructive coronary artery disease. The patient has nonischemic  cardiomyopathy. He is volume overloaded and I increased torsemide to 40 mg by mouth twice daily. Consider adding spironolactone or eplerenone in the near future.  Laboratory Data:  Chemistry Recent Labs  Lab 12/20/19 0903  NA 137  K 4.1  CL 105  CO2 22  GLUCOSE 209*  BUN 23*  CREATININE 1.24  CALCIUM 8.7*  GFRNONAA >60  GFRAA >60  ANIONGAP 10    Recent Labs  Lab 12/20/19 0903  PROT 6.6  ALBUMIN 3.4*  AST 20  ALT 20  ALKPHOS 56  BILITOT 3.4*   Hematology Recent Labs  Lab 12/20/19 0903  WBC 11.9*  RBC 6.05*  HGB 16.7  HCT 50.4  MCV 83.3  MCH 27.6  MCHC 33.1  RDW 14.8  PLT 165   Cardiac EnzymesNo results for input(s): TROPONINI in the last 168 hours. No results for input(s): TROPIPOC in the last 168 hours.  BNP Recent Labs  Lab 12/20/19 0903  BNP 2,920.0*    DDimer No results for input(s): DDIMER in the last 168 hours.  Radiology/Studies:  DG Chest Portable 1 View  Result Date: 12/20/2019 IMPRESSION: Cardiomegaly with pulmonary vascular congestion. Lower lung interstitial edema. Suspect a degree of congestive heart failure. Areas of atelectatic change without consolidation noted. No adenopathy appreciable. Electronically Signed   By: Lowella Grip III M.D.   On: 12/20/2019 09:32    Assessment and Plan:   1.  Acute hypoxic respiratory distress secondary to acute on chronic HFrEF secondary to NICM: -He is  not significantly volume overloaded with a weight that is up approximately 22 pounds from his dry weight of 232 pounds -Continue with IV Lasix with KCl repletion as indicated -Would work to escalate GDMT further by discontinuing clonidine and transitioning him from Toprol to carvedilol upon admission -Continue maximum dose Entresto and spironolactone -We will go ahead and get him set up to be seen in the advanced heart failure clinic in [place] for further evaluation and management including possible CardioMEMS given frequent ER  visits/hospitalizations -Daily weights -Strict I's and O's -CHF education  2.  Nonobstructive CAD with elevated high-sensitivity troponin: -He does note bilateral bandlike chest tightness which is likely in setting of volume overload -Prior cath showed nonobstructive disease as outlined above -Initial high-sensitivity troponin of 61 which is consistent with his prior values with a delta pending -No indication for heparin drip at this time without dynamic elevation in troponin -ASA -No plans for inpatient ischemic evaluation at this time pending delta troponin  3.  CKD stage III: -Renal function is at his baseline -Monitor with diuresis  4.  HTN: -Likely playing a role in his above exacerbation -BP should improve some with diuresis  -Continue medications as outlined above with recommendation to escalate as indicated  5.  COPD: -Does not appear to be active COPD exacerbation -Received DuoNeb in the ED   For questions or updates, please contact [place] Please consult [email or web address] for contact info under Cardiology/STEMI.   Signed, Christell Faith, PA-C [facility] Pager: [phone] 12/20/2019, 10:15 AM

## 2019-12-20 NOTE — ED Notes (Signed)
Pt does not understand why he needs to be  COVID swabbed. Explained to patient that he still could have COVID even if he received 1 dose of vaccine. Pt appears frustrated but gives permission to swab.

## 2019-12-20 NOTE — ED Notes (Signed)
Pt given meal tray. Demanding pain medication, secure chat hospitalist.

## 2019-12-20 NOTE — ED Triage Notes (Signed)
Pt here with c/o 22lb weight gain in the past 4 days, was sent here from the CHF clinic. Pt states no change in daily activities, has had a hard time eating due to [facility], 92% on RA in triage, placed on 2L per Page, began c/o left sided cp in triage. Pt states he woke up gasping for air a few times last night, is not on home O2 at this time.

## 2019-12-20 NOTE — Progress Notes (Signed)
Patient ID: Gabriel [name]., male    DOB: 27-Oct-1970, 49 y.o.   MRN: [id number]  HPI  Gabriel [name] is a 48 y/o male with a history of HTN, diabetes, COPD, CKD, asthma, previous tobacco use and chronic heart failure.   Echo report from 11/23/19 reviewed and showed an EF of 25-30% with mild LVH and moderately elevated PA Pressure. Echo report from 06/26/2019 reviewed and showed an EF of 30-35% along with moderately elevated PA pressure. Echo done 04/29/17 showed EF 45-50%. Echo report that was done 11/28/16 and it showed an EF of 40-45% along with mild Gabriel. EF has improved from 30-35% July 2017.  Was in the ED 12/11/19 due to right knee gout where he was treated and released. Admitted 11/22/19 due to acute on chronic HF. Initially needed IV lasix with resultant loss of >14L with transition to oral diuretics. Cardiology consult obtained. Elevated troponin thought to be due to demand ischemia. Needed oxygen at bedtime. Needs ICD but no insurance. Discharged after 2 days. Admitted 09/22/19 due to chest pain after not getting his medications refilled for 1 week prior to episode. No acute EKG changes and medications resumed. Discharged the following day. Was in the ED 09/05/19 due to dizziness. Glucose >400. IVF's  insulin given. COVID negative and he was released.   He presents today for an acute visit after showing up in the office saying he didn't fell well and hasn't slept in 3 nights because he wakes himself up gasping for air. He says that symptoms have been going on for the last 4-5 days. He says that last night he counted 30+ times that he woke himself up gasping for air during the night. He says that his fluid intake has declined because he gets so full with little intake. He says that his throat feels swollen making it difficult for him for him to swallow. He has associated fatigue, chest pain, abdominal distention, light-headedness, chronic pain, difficulty sleeping and weight gain along with this. He denies  any palpitations, pedal edema or cough.   He says that he's been taking his diuretic and other medications as ordered.    Past Medical History:  Diagnosis Date  . Chest wall pain, chronic   . Chronic systolic CHF (congestive heart failure) ([place])    a. 03/2015 Echo: EF 45-50%; b. 12/2015 Echo: EF 20-25%; c. 02/2016 Echo: EF 30-35%; d. 11/2016 Echo: EF 40-45%; e. 06/2019 Echo: EF 30-35%.  . Chronic Troponin Elevation   . CKD (chronic kidney disease), stage III   . COPD (chronic obstructive pulmonary disease) ([place])   . Diabetes mellitus without complication ([place])   . Hypertension    Resolved since weight loss  . NICM (nonischemic cardiomyopathy) ([place])    a. 03/2015 Echo: EF 45-50%; b. 04/2015 MV: small defect of mild severity in apex 2/2 apical thinning, EF 30-44%;  c. 12/2015 Echo: EF 20-25%; d. 02/2016 Echo: EF 30-35%; e. 11/2016 Echo: EF 40-45%, diff HK, mildly dil Ao root, mild Gabriel, mod dil LA;  f. 12/2016 Cath: LM nl, LAD/Diags/LCX/OMs min irregs, RCA 40p/m/d; g. 06/2019 Echo: EF 30-35%, glob HK. Mod dil LA. RVSP 58.4 mmHg.  . Non-obstructive CAD (coronary artery disease)    a. 04/2015 low risk MV;  b. 12/2016 Cath: minor irregs in LAD/Diag/LCX/OM, RCA 40p/m/d.  [name] (nonsustained ventricular tachycardia) ([place])    a. 12/2015 noted on tele-->amio;  b. 12/2015 Event monitor: no VT. Atach noted.  . Obesity (BMI 30.0-34.9)   .  Psoriasis   . Renal insufficiency   . Syncope    a. 01/2016 - felt to be vasovagal.  . Tobacco abuse    Past Surgical History:  Procedure Laterality Date  . AMPUTATION    . CARDIAC CATHETERIZATION    . FINGER AMPUTATION     Traumatic  . FINGER FRACTURE SURGERY Left   . LEFT HEART CATH AND CORONARY ANGIOGRAPHY N/A 01/06/2017   Procedure: Left Heart Cath and Coronary Angiography;  Surgeon: Wellington Hampshire, MD;  Location: [facility];  Service: Cardiovascular;  Laterality: N/A;   Family History  Problem Relation Age of Onset  . Diabetes Mellitus II Mother   .  Hypertension Father   . Cancer Paternal Aunt   . Cancer Maternal Grandfather   . Diabetes Maternal Grandfather    Social History   Tobacco Use  . Smoking status: Former Smoker    Packs/day: 0.50    Years: 33.00    Pack years: 16.50    Types: Cigarettes    Quit date: 08/18/2019    Years since quitting: 0.3  . Smokeless tobacco: Never Used  . Tobacco comment: Quit a little over a month ago.  Substance Use Topics  . Alcohol use: Yes    Alcohol/week: 0.0 standard drinks    Comment: occassionally   Allergies  Allergen Reactions  . Metformin And Related Nausea And Vomiting  . Prednisone Other (See Comments)    Reaction: Hallucinations    . Zantac [Ranitidine Hcl] Diarrhea and Nausea Only    Night sweats   Prior to Admission medications   Medication Sig Start Date End Date Taking? Authorizing Provider  aspirin 81 MG chewable tablet Chew 1 tablet (81 mg total) by mouth daily. Reported on 02/08/2016 08/27/18  Yes [name] A, FNP  cloNIDine (CATAPRES) 0.1 MG tablet Take 1 tablet (0.1 mg total) by mouth 2 (two) times daily. 12/01/19 12/31/19 Yes [name] A, FNP  colchicine 0.6 MG tablet Take 1 tablet (0.6 mg total) by mouth daily. Take 1 tab daily for at least 6 more days. If  Symptoms persist past 6 days continue to use until prescription is finished 12/11/19  Yes Cuthriell, Charline Bills, PA-C  insulin detemir (LEVEMIR) 100 UNIT/ML injection Inject 0.24 mLs (24 Units total) into the skin at bedtime. 09/23/19 12/20/19 Yes [name], MD  metoprolol (TOPROL XL) 200 MG 24 hr tablet Take 0.5 tablets (100 mg total) by mouth daily. 12/01/19 12/31/19 Yes Elowyn Raupp, Aura Fey, FNP  nitroGLYCERIN (NITROSTAT) 0.4 MG SL tablet Place 1 tablet (0.4 mg total) under the tongue every 5 (five) minutes x 3 doses as needed for chest pain. 06/27/19  Yes [name], MD  rosuvastatin (CRESTOR) 20 MG tablet Take 1 tablet (20 mg total) by mouth daily. 12/01/19  Yes Marek Nghiem A, FNP  sacubitril-valsartan (ENTRESTO)  97-103 MG Take 1 tablet by mouth 2 (two) times daily.   Yes [provider]  spironolactone (ALDACTONE) 25 MG tablet Take 1 tablet (25 mg total) by mouth 2 (two) times daily. 12/01/19 12/31/19 Yes Znya Albino, Aura Fey, FNP  torsemide (DEMADEX) 20 MG tablet Take 2 tablets (40 mg total) by mouth 2 (two) times daily. 12/01/19  Yes Alisa Graff, FNP     Review of Systems  Constitutional: Positive for fatigue (easily). Negative for appetite change.  HENT: Positive for congestion and trouble swallowing (throat feels swollen"). Negative for postnasal drip and sore throat.   Eyes: Negative.   Respiratory: Positive for shortness of  breath (easily). Negative for cough and chest tightness.   Cardiovascular: Positive for chest pain ("just the usual"). Negative for palpitations and leg swelling.  Gastrointestinal: Positive for abdominal distention and nausea. Negative for abdominal pain and vomiting.  Endocrine: Negative.   Genitourinary: Negative.   Musculoskeletal: Positive for back pain.  Skin: Negative.   Allergic/Immunologic: Negative.   Neurological: Positive for light-headedness (on occasion) and numbness (down both legs at times). Negative for dizziness and headaches.  Hematological: Negative for adenopathy. Does not bruise/bleed easily.  Psychiatric/Behavioral: Positive for sleep disturbance. Negative for dysphoric mood and suicidal ideas. The patient is not nervous/anxious.    Vitals:   12/20/19 0817  BP: (!) 165/117  Pulse: 89  Resp: 18  SpO2: 98%  Weight: 254 lb 8 oz (115.4 kg)  Height: 6' (1.829 m)   Wt Readings from Last 3 Encounters:  12/20/19 254 lb 8 oz (115.4 kg)  12/11/19 245 lb (111.1 kg)  12/01/19 232 lb 6 oz (105.4 kg)   Lab Results  Component Value Date   CREATININE 1.36 (H) 12/11/2019   CREATININE 1.21 11/24/2019   CREATININE 1.27 (H) 11/23/2019    Physical Exam  Constitutional: He is oriented to person, place, and time. He appears well-developed and  well-nourished.  HENT:  Head: Normocephalic and atraumatic.  Neck: JVD present.  Cardiovascular: Normal rate and regular rhythm.  Pulmonary/Chest: Effort normal. He has no wheezes. He has rales (few in LLL). He exhibits no tenderness.  Abdominal: Soft. He exhibits distension. There is no abdominal tenderness.  Musculoskeletal:        General: No tenderness or edema.     Cervical back: Normal range of motion and neck supple.  Neurological: He is alert and oriented to person, place, and time.  Skin: Skin is warm and dry.  Psychiatric: He has a normal mood and affect. His behavior is normal. Thought content normal.  Nursing note and vitals reviewed.   Assessment & Plan:  1: Acute on Chronic heart failure with reduced ejection fraction- - NYHA class IV - moderately fluid overloaded today - weighing daily and reminded to call for an overnight weight gain of >2 pounds or a weekly weight gain of >5 pounds; weight at home has been rising.  - weight up 22.2 pounds from last visit 2 weeks ago - not adding salt and he was encouraged to closely follow a 2000mg  sodium diet - saw cardiology Gabriel [name]) 10/11/19 & returns July 2021 - saw EP Gabriel [name]) 09/02/19; discussion about ICD but patient is currently uninsured - has been on higher metoprolol dose before but was quite fatigued - consider adding jardiance - will send patient to ED; patient was taken in a wheelchair by a volunteer and ED triage nurse was informed  - discussed sending to [facility] in [place] once he recovers from this - BNP from 11/22/19 was 1698.0 - will send in referral for sleep study; he's to inform them that he has medicaid pending  2: HTN- - BP elevated today but he's 22 pounds up from 2 weeks ago - saw PCP @ [facility] 04/07/2019 but does not want to return; he is trying to get set up at [facility] from 12/11/19 reviewed and showed sodium 136, potassium 3.2, creatinine 1.36 and GFR >60  3: Diabetes-   -  A1c 8.7% on  11/23/19 - fasting glucose in clinic today was 211 - says that he doesn't want to take metformin due to nausea/ vomiting after every door; explained  that he needed to follow-up with PCP regarding this - followed by [facility] - completed some classes at the [facility]   Patient did not bring his medications nor a list. Each medication was verbally reviewed with the patient and he was encouraged to bring the bottles to every visit to confirm accuracy of list.   Follow-up pending ED disposition.

## 2019-12-20 NOTE — Patient Instructions (Signed)
Continue weighing daily and call for an overnight weight gain of > 2 pounds or a weekly weight gain of >5 pounds. 

## 2019-12-20 NOTE — H&P (Signed)
History and Physical    Gabriel [name]. [id number] DOB: 10-Jan-1971 DOA: 12/20/2019  PCP: Alisa Graff, FNP   Patient coming from: Home  I have personally briefly reviewed patient's old medical records in [place]  Chief Complaint: Shortness of breath                                Fluid retention  HPI: Gabriel Befort. is a 49 y.o. male with medical history significant  for nonischemic cardiomyopathy, nicotine dependence, chronic systolic heart failure (LVEF 30 - 35%),HTN, DM2,chronic chest wall pain, nonobstructive coronary artery disease, CKD2, noncompliance with medication who presents to the emergency room for evaluation of shortness of breath that has progressively worsened over the last 3 days associated with orthopnea and paroxysmal nocturnal dyspnea.  Patient states that he has gained 20 pounds in the last 4 days.  He was noted to have a pulse oximetry of 92% in triage requiring oxygen supplementation.  While in triage patient started complaining of left-sided chest pain which is worse when he coughs. He denies having any diaphoresis, nausea, vomiting or palpitations.  He denies having abdominal pain or any changes in his bowel  Patient had gone to the CHF clinic and was referred to the emergency room due to the 20 pound weight gain in a couple of days. Chest x-ray shows cardiomegaly with pulmonary vascular congestion. Lower lung interstitial edema. Suspect a degree of congestive heart failure. Areas of atelectatic change without consolidation noted. No adenopathy appreciable. Labs revealed BNP of greater than 2000 and slightly greater troponin of 61  ED Course: Patient is a 50 year old Caucasian male seen in the emergency room for 20 pound weight gain over the last couple of days associated with shortness of breath, orthopnea and paroxysmal nocturnal dyspnea.  Chest x-ray is consistent with CHF and patient has slightly elevated troponin levels.  BNP is also  elevated.  Patient received a dose of Lasix 60 mg IV in the emergency room and has diuresed appropriately.  He will be admitted to the hospital for further evaluation.  Cardiology consult was placed in the emergency room.  Review of Systems: As per HPI otherwise 10 point review of systems negative.   Past Medical History:  Diagnosis Date  . Chest wall pain, chronic   . Chronic systolic CHF (congestive heart failure) ([place])    a. 03/2015 Echo: EF 45-50%; b. 12/2015 Echo: EF 20-25%; c. 02/2016 Echo: EF 30-35%; d. 11/2016 Echo: EF 40-45%; e. 06/2019 Echo: EF 30-35%.  . Chronic Troponin Elevation   . CKD (chronic kidney disease), stage III   . COPD (chronic obstructive pulmonary disease) ([place])   . Diabetes mellitus without complication ([place])   . Hypertension    Resolved since weight loss  . NICM (nonischemic cardiomyopathy) ([place])    a. 03/2015 Echo: EF 45-50%; b. 04/2015 MV: small defect of mild severity in apex 2/2 apical thinning, EF 30-44%;  c. 12/2015 Echo: EF 20-25%; d. 02/2016 Echo: EF 30-35%; e. 11/2016 Echo: EF 40-45%, diff HK, mildly dil Ao root, mild MR, mod dil LA;  f. 12/2016 Cath: LM nl, LAD/Diags/LCX/OMs min irregs, RCA 40p/m/d; g. 06/2019 Echo: EF 30-35%, glob HK. Mod dil LA. RVSP 58.4 mmHg.  . Non-obstructive CAD (coronary artery disease)    a. 04/2015 low risk MV;  b. 12/2016 Cath: minor irregs in LAD/Diag/LCX/OM, RCA 40p/m/d.  [name] (nonsustained ventricular tachycardia) ([place])  a. 12/2015 noted on tele-->amio;  b. 12/2015 Event monitor: no VT. Atach noted.  . Obesity (BMI 30.0-34.9)   . Psoriasis   . Renal insufficiency   . Syncope    a. 01/2016 - felt to be vasovagal.  . Tobacco abuse     Past Surgical History:  Procedure Laterality Date  . AMPUTATION    . CARDIAC CATHETERIZATION    . FINGER AMPUTATION     Traumatic  . FINGER FRACTURE SURGERY Left   . LEFT HEART CATH AND CORONARY ANGIOGRAPHY N/A 01/06/2017   Procedure: Left Heart Cath and Coronary Angiography;  Surgeon: Wellington Hampshire, MD;  Location: [facility];  Service: Cardiovascular;  Laterality: N/A;     reports that he quit smoking about 4 months ago. His smoking use included cigarettes. He has a 16.50 pack-year smoking history. He has never used smokeless tobacco. He reports current alcohol use. He reports that he does not use drugs.  Allergies  Allergen Reactions  . Metformin And Related Nausea And Vomiting  . Prednisone Other (See Comments)    Reaction: Hallucinations    . Zantac [Ranitidine Hcl] Diarrhea and Nausea Only    Night sweats    Family History  Problem Relation Age of Onset  . Diabetes Mellitus II Mother   . Hypertension Father   . Cancer Paternal Aunt   . Cancer Maternal Grandfather   . Diabetes Maternal Grandfather      Prior to Admission medications   Medication Sig Start Date End Date Taking? Authorizing Provider  aspirin 81 MG chewable tablet Chew 1 tablet (81 mg total) by mouth daily. Reported on 02/08/2016 08/27/18   Alisa Graff, FNP  cloNIDine (CATAPRES) 0.1 MG tablet Take 1 tablet (0.1 mg total) by mouth 2 (two) times daily. 12/01/19 12/31/19  Alisa Graff, FNP  colchicine 0.6 MG tablet Take 1 tablet (0.6 mg total) by mouth daily. Take 1 tab daily for at least 6 more days. If  Symptoms persist past 6 days continue to use until prescription is finished 12/11/19   Cuthriell, Roderic Palau D, PA-C  insulin detemir (LEVEMIR) 100 UNIT/ML injection Inject 0.24 mLs (24 Units total) into the skin at bedtime. 09/23/19 12/20/19  [name], MD  metoprolol (TOPROL XL) 200 MG 24 hr tablet Take 0.5 tablets (100 mg total) by mouth daily. 12/01/19 12/31/19  Alisa Graff, FNP  nitroGLYCERIN (NITROSTAT) 0.4 MG SL tablet Place 1 tablet (0.4 mg total) under the tongue every 5 (five) minutes x 3 doses as needed for chest pain. 06/27/19   [name], MD  rosuvastatin (CRESTOR) 20 MG tablet Take 1 tablet (20 mg total) by mouth daily. 12/01/19   Alisa Graff, FNP  sacubitril-valsartan  (ENTRESTO) 97-103 MG Take 1 tablet by mouth 2 (two) times daily.    [provider]  spironolactone (ALDACTONE) 25 MG tablet Take 1 tablet (25 mg total) by mouth 2 (two) times daily. 12/01/19 12/31/19  Alisa Graff, FNP  torsemide (DEMADEX) 20 MG tablet Take 2 tablets (40 mg total) by mouth 2 (two) times daily. 12/01/19   Alisa Graff, FNP    Physical Exam: Vitals:   12/20/19 1000 12/20/19 1030 12/20/19 1100 12/20/19 1130  BP: (!) 144/105 (!) 133/94 (!) 142/103 (!) 146/107  Pulse:  83 79 77  Resp: (!) 26 (!) 23 (!) 31 (!) 29  Temp:      TempSrc:      SpO2:  97% 97% 92%  Weight:  Height:         Vitals:   12/20/19 1000 12/20/19 1030 12/20/19 1100 12/20/19 1130  BP: (!) 144/105 (!) 133/94 (!) 142/103 (!) 146/107  Pulse:  83 79 77  Resp: (!) 26 (!) 23 (!) 31 (!) 29  Temp:      TempSrc:      SpO2:  97% 97% 92%  Weight:      Height:        Constitutional: NAD, alert and oriented x 3. Patient has conversational dyspnea Eyes: PERRL, lids and conjunctivae normal ENMT: Mucous membranes are moist.  Neck: normal, supple, no masses, no thyromegaly Respiratory: Decreased air entry at the bases, no wheezing, crackles at the bases. Normal respiratory effort. No accessory muscle use.  Cardiovascular: Regular rate and rhythm, no murmurs / rubs / gallops. 2+ extremity edema. 2+ pedal pulses. No carotid bruits.  Abdomen: no tenderness, no masses palpated. No hepatosplenomegaly. Bowel sounds positive.  Musculoskeletal: no clubbing / cyanosis. No joint deformity upper and lower extremities.  Skin: no rashes, lesions, ulcers.  Neurologic: No gross focal neurologic deficit. Psychiatric: Normal mood and affect.   Labs on Admission: I have personally reviewed following labs and imaging studies  CBC: Recent Labs  Lab 12/20/19 0903  WBC 11.9*  NEUTROABS 8.5*  HGB 16.7  HCT 50.4  MCV 83.3  PLT 123XX123   Basic Metabolic Panel: Recent Labs  Lab 12/20/19 0903  NA 137  K 4.1   CL 105  CO2 22  GLUCOSE 209*  BUN 23*  CREATININE 1.24  CALCIUM 8.7*   GFR: Estimated Creatinine Clearance: 93.4 mL/min (by C-G formula based on SCr of 1.24 mg/dL). Liver Function Tests: Recent Labs  Lab 12/20/19 0903  AST 20  ALT 20  ALKPHOS 56  BILITOT 3.4*  PROT 6.6  ALBUMIN 3.4*   No results for input(s): LIPASE, AMYLASE in the last 168 hours. No results for input(s): AMMONIA in the last 168 hours. Coagulation Profile: No results for input(s): INR, PROTIME in the last 168 hours. Cardiac Enzymes: No results for input(s): CKTOTAL, CKMB, CKMBINDEX, TROPONINI in the last 168 hours. BNP (last 3 results) No results for input(s): PROBNP in the last 8760 hours. HbA1C: No results for input(s): HGBA1C in the last 72 hours. CBG: Recent Labs  Lab 12/20/19 0816  GLUCAP 211*   Lipid Profile: No results for input(s): CHOL, HDL, LDLCALC, TRIG, CHOLHDL, LDLDIRECT in the last 72 hours. Thyroid Function Tests: No results for input(s): TSH, T4TOTAL, FREET4, T3FREE, THYROIDAB in the last 72 hours. Anemia Panel: No results for input(s): VITAMINB12, FOLATE, FERRITIN, TIBC, IRON, RETICCTPCT in the last 72 hours. Urine analysis:    Component Value Date/Time   COLORURINE STRAW (A) 01/09/2018 1659   APPEARANCEUR Clear 07/29/2018 1200   LABSPEC 1.026 01/09/2018 1659   LABSPEC 1.019 11/12/2012 1713   PHURINE 6.0 01/09/2018 1659   GLUCOSEU Negative 07/29/2018 1200   GLUCOSEU Negative 11/12/2012 1713   HGBUR SMALL (A) 01/09/2018 1659   BILIRUBINUR Negative 07/29/2018 1200   BILIRUBINUR Negative 11/12/2012 1713   KETONESUR 5 (A) 01/09/2018 1659   PROTEINUR 2+ (A) 07/29/2018 1200   PROTEINUR 100 (A) 01/09/2018 1659   NITRITE Negative 07/29/2018 1200   NITRITE NEGATIVE 01/09/2018 1659   LEUKOCYTESUR Negative 07/29/2018 1200   LEUKOCYTESUR Negative 11/12/2012 1713    Radiological Exams on Admission: DG Chest Portable 1 View  Result Date: 12/20/2019 CLINICAL DATA:  Shortness of  breath EXAM: PORTABLE CHEST 1 VIEW COMPARISON:  November 22, 2019.  FINDINGS: There is cardiomegaly with mild pulmonary venous hypertension. There is atelectatic change in the bases and left mid lung. There is bilateral lower lung interstitial edema. No airspace consolidation. No adenopathy. No bone lesions. IMPRESSION: Cardiomegaly with pulmonary vascular congestion. Lower lung interstitial edema. Suspect a degree of congestive heart failure. Areas of atelectatic change without consolidation noted. No adenopathy appreciable. Electronically Signed   By: Lowella Grip III M.D.   On: 12/20/2019 09:32    EKG: Independently reviewed. Sinus rhythm with PVC's  Assessment/Plan Principal Problem:   Acute on chronic combined systolic and diastolic CHF (congestive heart failure) ([facility]) Active Problems:   Essential hypertension   COPD (chronic obstructive pulmonary disease) ([facility])   NICM (nonischemic cardiomyopathy) ([facility])   Tobacco use   Noncompliance with medications   CKD (chronic kidney disease), stage IIIa   Acute on chronic combined systolic and diastolic dysfunction CHF Patient last known LVEF is 30 - 35% He presents for evaluation of worsening shortness of breath from his baseline associated with orthopnea and PND Chest x-ray showed cardiomegaly with vascular congestion. Blood pressure was also significantly elevated Will place patient on Lasix 40 mg IV every 12 Optimize blood pressure control Continue metoprolol, entresto and spironolactone Maintain 1.5 L fluid restriction Consult cardiology     Hypertensive urgency Optimize blood pressure control Continue metoprolol, Clonidine and spironolactone    Diabetes mellitus with hyperglycemia Maintain consistent carbohydrate diet  Glycemic control with sliding scale coverage   Nicotine dependence Smoking cessation was discussed with patient in detail He states that he has cut back and only smokes 3 cigarettes daily He declines a  nicotine transdermal patch    DVT prophylaxis: Lovenox Code Status: Full code Family Communication: Greater than 50% of time was spent discussing plan of care with patient at the bedside. All questions and concerns have been addressed. He verbalizes understanding and agrees with the plan of care Disposition Plan: Back to previous home environment Consults called: Cardiology    Fount Bahe MD [facility]     12/20/2019, 12:11 PM

## 2019-12-20 NOTE — ED Notes (Addendum)
States he takes his meds at night before bed, not in evening. Pt ate 100% of dinner tray.

## 2019-12-20 NOTE — ED Provider Notes (Signed)
[facility] Emergency Department Provider Note    First MD Initiated Contact with Patient 12/20/19 [phone]     (approximate)  I have reviewed the triage vital signs and the nursing notes.   HISTORY  Chief Complaint Shortness of Breath, Fluid retention, and Chest Pain    HPI Gabriel [name]. is a 49 y.o. male extensive past medical history as listed below presents to the ER for worsening shortness of breath orthopnea and frequently waking up throughout the night with severe shortness of breath gasping for air.  He does not wear oxygen and arrives with acute respiratory failure with hypoxia requiring supplemental oxygen.  States has been compliant with his medications.  He denies any fevers or chills.  His cardiologist is [name] and he does follow-up in heart failure clinic with no recent medication changes.    Past Medical History:  Diagnosis Date  . Chest wall pain, chronic   . Chronic systolic CHF (congestive heart failure) ([place])    a. 03/2015 Echo: EF 45-50%; b. 12/2015 Echo: EF 20-25%; c. 02/2016 Echo: EF 30-35%; d. 11/2016 Echo: EF 40-45%; e. 06/2019 Echo: EF 30-35%.  . Chronic Troponin Elevation   . CKD (chronic kidney disease), stage III   . COPD (chronic obstructive pulmonary disease) ([place])   . Diabetes mellitus without complication ([place])   . Hypertension    Resolved since weight loss  . NICM (nonischemic cardiomyopathy) ([place])    a. 03/2015 Echo: EF 45-50%; b. 04/2015 MV: small defect of mild severity in apex 2/2 apical thinning, EF 30-44%;  c. 12/2015 Echo: EF 20-25%; d. 02/2016 Echo: EF 30-35%; e. 11/2016 Echo: EF 40-45%, diff HK, mildly dil Ao root, mild MR, mod dil LA;  f. 12/2016 Cath: LM nl, LAD/Diags/LCX/OMs min irregs, RCA 40p/m/d; g. 06/2019 Echo: EF 30-35%, glob HK. Mod dil LA. RVSP 58.4 mmHg.  . Non-obstructive CAD (coronary artery disease)    a. 04/2015 low risk MV;  b. 12/2016 Cath: minor irregs in LAD/Diag/LCX/OM, RCA 40p/m/d.  [name]  (nonsustained ventricular tachycardia) ([place])    a. 12/2015 noted on tele-->amio;  b. 12/2015 Event monitor: no VT. Atach noted.  . Obesity (BMI 30.0-34.9)   . Psoriasis   . Renal insufficiency   . Syncope    a. 01/2016 - felt to be vasovagal.  . Tobacco abuse    Family History  Problem Relation Age of Onset  . Diabetes Mellitus II Mother   . Hypertension Father   . Cancer Paternal Aunt   . Cancer Maternal Grandfather   . Diabetes Maternal Grandfather    Past Surgical History:  Procedure Laterality Date  . AMPUTATION    . CARDIAC CATHETERIZATION    . FINGER AMPUTATION     Traumatic  . FINGER FRACTURE SURGERY Left   . LEFT HEART CATH AND CORONARY ANGIOGRAPHY N/A 01/06/2017   Procedure: Left Heart Cath and Coronary Angiography;  Surgeon: Wellington Hampshire, MD;  Location: [facility];  Service: Cardiovascular;  Laterality: N/A;   Patient Active Problem List   Diagnosis Date Noted  . Acute on chronic combined systolic (congestive) and diastolic (congestive) heart failure ([place]) 12/20/2019  . CHF exacerbation ([place]) 11/22/2019  . Acute CHF (congestive heart failure) ([place]) 06/26/2019  . CKD (chronic kidney disease), stage IIIa 06/26/2019  . Type II diabetes mellitus with renal manifestations ([place]) 06/26/2019  . Hypertensive urgency 06/26/2019  . Acute on chronic combined systolic and diastolic CHF (congestive heart failure) ([place]) 09/02/2017  .  Chest wall pain, chronic 08/24/2017  . Dizziness   . Noncompliance with medications 04/29/2017  . Back pain 03/06/2017  . Tobacco use 12/04/2016  . Gallbladder sludge 12/04/2016  . Coronary artery disease, non-occlusive 03/15/2016  . Atypical chest pain 02/16/2016  . Orthostatic hypotension 01/23/2016  . Hypomagnesemia 01/23/2016  . NICM (nonischemic cardiomyopathy) ([place]) 01/17/2016  . NSVT (nonsustained ventricular tachycardia) ([place])   . Elevated troponin   . Hyperlipidemia 05/17/2015  . COPD (chronic obstructive pulmonary  disease) ([place]) 05/17/2015  . Essential hypertension 03/31/2015      Prior to Admission medications   Medication Sig Start Date End Date Taking? Authorizing Provider  aspirin 81 MG chewable tablet Chew 1 tablet (81 mg total) by mouth daily. Reported on 02/08/2016 08/27/18   Alisa Graff, FNP  cloNIDine (CATAPRES) 0.1 MG tablet Take 1 tablet (0.1 mg total) by mouth 2 (two) times daily. 12/01/19 12/31/19  Alisa Graff, FNP  colchicine 0.6 MG tablet Take 1 tablet (0.6 mg total) by mouth daily. Take 1 tab daily for at least 6 more days. If  Symptoms persist past 6 days continue to use until prescription is finished 12/11/19   Cuthriell, Roderic Palau D, PA-C  insulin detemir (LEVEMIR) 100 UNIT/ML injection Inject 0.24 mLs (24 Units total) into the skin at bedtime. 09/23/19 12/20/19  [name], MD  metoprolol (TOPROL XL) 200 MG 24 hr tablet Take 0.5 tablets (100 mg total) by mouth daily. 12/01/19 12/31/19  Alisa Graff, FNP  nitroGLYCERIN (NITROSTAT) 0.4 MG SL tablet Place 1 tablet (0.4 mg total) under the tongue every 5 (five) minutes x 3 doses as needed for chest pain. 06/27/19   [name], MD  rosuvastatin (CRESTOR) 20 MG tablet Take 1 tablet (20 mg total) by mouth daily. 12/01/19   Alisa Graff, FNP  sacubitril-valsartan (ENTRESTO) 97-103 MG Take 1 tablet by mouth 2 (two) times daily.    [provider]  spironolactone (ALDACTONE) 25 MG tablet Take 1 tablet (25 mg total) by mouth 2 (two) times daily. 12/01/19 12/31/19  Alisa Graff, FNP  torsemide (DEMADEX) 20 MG tablet Take 2 tablets (40 mg total) by mouth 2 (two) times daily. 12/01/19   Alisa Graff, FNP    Allergies Metformin and related, Prednisone, and Zantac [ranitidine hcl]    Social History Social History   Tobacco Use  . Smoking status: Former Smoker    Packs/day: 0.50    Years: 33.00    Pack years: 16.50    Types: Cigarettes    Quit date: 08/18/2019    Years since quitting: 0.3  . Smokeless tobacco: Never Used  .  Tobacco comment: Quit a little over a month ago.  Substance Use Topics  . Alcohol use: Yes    Alcohol/week: 0.0 standard drinks    Comment: occassionally  . Drug use: No    Review of Systems Patient denies headaches, rhinorrhea, blurry vision, numbness, shortness of breath, chest pain, edema, cough, abdominal pain, nausea, vomiting, diarrhea, dysuria, fevers, rashes or hallucinations unless otherwise stated above in HPI. ____________________________________________   PHYSICAL EXAM:  VITAL SIGNS: Vitals:   12/20/19 1100 12/20/19 1130  BP: (!) 142/103 (!) 146/107  Pulse: 79 77  Resp: (!) 31 (!) 29  Temp:    SpO2: 97% 92%    Constitutional: Alert and oriented.  Eyes: Conjunctivae are normal.  Head: Atraumatic. Nose: No congestion/rhinnorhea. Mouth/Throat: Mucous membranes are moist.   Neck: No stridor. Painless ROM.  Cardiovascular: Normal rate, regular rhythm. Grossly  normal heart sounds.  Good peripheral circulation. Respiratory: mild tachypnea with diminished posterior breathsounds Gastrointestinal: Soft and nontender. No distention. No abdominal bruits. No CVA tenderness. Genitourinary: deferred Musculoskeletal: No lower extremity tenderness nor edema.  No joint effusions. Neurologic:  Normal speech and language. No gross focal neurologic deficits are appreciated. No facial droop Skin:  Skin is warm, dry and intact. No rash noted. Psychiatric: Mood and affect are normal. Speech and behavior are normal.  ____________________________________________   LABS (all labs ordered are listed, but only abnormal results are displayed)  Results for orders placed or performed during the hospital encounter of 12/20/19 (from the past 24 hour(s))  CBC with Differential/Platelet     Status: Abnormal   Collection Time: 12/20/19  9:03 AM  Result Value Ref Range   WBC 11.9 (H) 4.0 - 10.5 K/uL   RBC 6.05 (H) 4.22 - 5.81 MIL/uL   Hemoglobin 16.7 13.0 - 17.0 g/dL   HCT 50.4 39.0 - 52.0  %   MCV 83.3 80.0 - 100.0 fL   MCH 27.6 26.0 - 34.0 pg   MCHC 33.1 30.0 - 36.0 g/dL   RDW 14.8 11.5 - 15.5 %   Platelets 165 150 - 400 K/uL   nRBC 0.0 0.0 - 0.2 %   Neutrophils Relative % 71 %   Neutro Abs 8.5 (H) 1.7 - 7.7 K/uL   Lymphocytes Relative 21 %   Lymphs Abs 2.5 0.7 - 4.0 K/uL   Monocytes Relative 6 %   Monocytes Absolute 0.7 0.1 - 1.0 K/uL   Eosinophils Relative 1 %   Eosinophils Absolute 0.1 0.0 - 0.5 K/uL   Basophils Relative 1 %   Basophils Absolute 0.1 0.0 - 0.1 K/uL   Immature Granulocytes 0 %   Abs Immature Granulocytes 0.05 0.00 - 0.07 K/uL  Comprehensive metabolic panel     Status: Abnormal   Collection Time: 12/20/19  9:03 AM  Result Value Ref Range   Sodium 137 135 - 145 mmol/L   Potassium 4.1 3.5 - 5.1 mmol/L   Chloride 105 98 - 111 mmol/L   CO2 22 22 - 32 mmol/L   Glucose, Bld 209 (H) 70 - 99 mg/dL   BUN 23 (H) 6 - 20 mg/dL   Creatinine, Ser 1.24 0.61 - 1.24 mg/dL   Calcium 8.7 (L) 8.9 - 10.3 mg/dL   Total Protein 6.6 6.5 - 8.1 g/dL   Albumin 3.4 (L) 3.5 - 5.0 g/dL   AST 20 15 - 41 U/L   ALT 20 0 - 44 U/L   Alkaline Phosphatase 56 38 - 126 U/L   Total Bilirubin 3.4 (H) 0.3 - 1.2 mg/dL   GFR calc non Af Amer >60 >60 mL/min   GFR calc Af Amer >60 >60 mL/min   Anion gap 10 5 - 15  Troponin I (High Sensitivity)     Status: Abnormal   Collection Time: 12/20/19  9:03 AM  Result Value Ref Range   Troponin I (High Sensitivity) 61 (H) <18 ng/L  Brain natriuretic peptide     Status: Abnormal   Collection Time: 12/20/19  9:03 AM  Result Value Ref Range   B Natriuretic Peptide 2,920.0 (H) 0.0 - 100.0 pg/mL  Respiratory Panel by RT PCR (Flu A&B, Covid) - Nasopharyngeal Swab     Status: None   Collection Time: 12/20/19 10:06 AM   Specimen: Nasopharyngeal Swab  Result Value Ref Range   SARS Coronavirus 2 by RT PCR NEGATIVE NEGATIVE   Influenza A by  PCR NEGATIVE NEGATIVE   Influenza B by PCR NEGATIVE NEGATIVE  Troponin I (High Sensitivity)     Status:  Abnormal   Collection Time: 12/20/19 11:36 AM  Result Value Ref Range   Troponin I (High Sensitivity) 62 (H) <18 ng/L   ____________________________________________  EKG My review and personal interpretation at Time: 8:47   Indication: sob  Rate: 85  Rhythm: sinus Axis: normal Other: normal intervals, no stemi, nonspecific st abn, occasional pvc ____________________________________________  RADIOLOGY  I personally reviewed all radiographic images ordered to evaluate for the above acute complaints and reviewed radiology reports and findings.  These findings were personally discussed with the patient.  Please see medical record for radiology report.  ____________________________________________   PROCEDURES  Procedure(s) performed:  .Critical Care Performed by: Merlyn Lot, MD Authorized by: Merlyn Lot, MD   Critical care provider statement:    Critical care time (minutes):  35   Critical care time was exclusive of:  Separately billable procedures and treating other patients   Critical care was necessary to treat or prevent imminent or life-threatening deterioration of the following conditions:  Respiratory failure   Critical care was time spent personally by me on the following activities:  Development of treatment plan with patient or surrogate, discussions with consultants, evaluation of patient's response to treatment, examination of patient, obtaining history from patient or surrogate, ordering and performing treatments and interventions, ordering and review of laboratory studies, ordering and review of radiographic studies, pulse oximetry, re-evaluation of patient's condition and review of old charts      Critical Care performed: yes ____________________________________________   INITIAL IMPRESSION / [place] / ED COURSE  Pertinent labs & imaging results that were available during my care of the patient were reviewed by me and considered in my  medical decision making (see chart for details).   DDX: Asthma, copd, CHF, pna, ptx, malignancy, Pe, anemia   Amy N Catlin Bato. is a 49 y.o. who presents to the ED with evidence of acute respiratory failure with hypoxia that I suspect is secondary to primarily worsening congestive heart failure as the patient's had nearly 20 pound weight gain.  Have a lower suspicion for infectious process.  May have a component of bronchospasm will give bronchodilators but will order IV Lasix and reassess.  EKG is abnormal but chronically so.  No sign of dysrhythmia on telemetry.  Patient is requiring 2 L nasal cannula.  Require admission to hospital for further evaluation and management.     The patient was evaluated in Emergency Department today for the symptoms described in the history of present illness. He/she was evaluated in the context of the global COVID-19 pandemic, which necessitated consideration that the patient might be at risk for infection with the SARS-CoV-2 virus that causes COVID-19. Institutional protocols and algorithms that pertain to the evaluation of patients at risk for COVID-19 are in a state of rapid change based on information released by regulatory bodies including the CDC and federal and state organizations. These policies and algorithms were followed during the patient's care in the ED.  As part of my medical decision making, I reviewed the following data within the [facility] notes reviewed and incorporated, Labs reviewed, notes from prior ED visits and [place] Controlled Substance Database   ____________________________________________   FINAL CLINICAL IMPRESSION(S) / ED DIAGNOSES  Final diagnoses:  Acute respiratory failure with hypoxia ([place])  Acute on chronic congestive heart failure, unspecified heart failure type ([place])  NEW MEDICATIONS STARTED DURING THIS VISIT:  New Prescriptions   No medications on file     Note:  This document was  prepared using Dragon voice recognition software and may include unintentional dictation errors.    Merlyn Lot, MD 12/20/19 1252

## 2019-12-21 ENCOUNTER — Telehealth: Payer: Self-pay

## 2019-12-21 DIAGNOSIS — I5023 Acute on chronic systolic (congestive) heart failure: Secondary | ICD-10-CM

## 2019-12-21 DIAGNOSIS — J9601 Acute respiratory failure with hypoxia: Secondary | ICD-10-CM

## 2019-12-21 DIAGNOSIS — M940 Chondrocostal junction syndrome [Tietze]: Secondary | ICD-10-CM

## 2019-12-21 LAB — BASIC METABOLIC PANEL
Anion gap: 10 (ref 5–15)
BUN: 29 mg/dL — ABNORMAL HIGH (ref 6–20)
CO2: 28 mmol/L (ref 22–32)
Calcium: 8.5 mg/dL — ABNORMAL LOW (ref 8.9–10.3)
Chloride: 98 mmol/L (ref 98–111)
Creatinine, Ser: 1.4 mg/dL — ABNORMAL HIGH (ref 0.61–1.24)
GFR calc Af Amer: >60 mL/min (ref >60)
GFR calc non Af Amer: 58 mL/min — ABNORMAL LOW (ref >60)
Glucose, Bld: 150 mg/dL — ABNORMAL HIGH (ref 70–99)
Potassium: 3.7 mmol/L (ref 3.5–5.1)
Sodium: 136 mmol/L (ref 135–145)

## 2019-12-21 LAB — GLUCOSE, CAPILLARY
Glucose-Capillary: 150 mg/dL — ABNORMAL HIGH (ref 70–99)
Glucose-Capillary: 200 mg/dL — ABNORMAL HIGH (ref 70–99)
Glucose-Capillary: 309 mg/dL — ABNORMAL HIGH (ref 70–99)
Glucose-Capillary: 419 mg/dL — ABNORMAL HIGH (ref 70–99)

## 2019-12-21 LAB — GLUCOSE, RANDOM: Glucose, Bld: 373 mg/dL — ABNORMAL HIGH (ref 70–99)

## 2019-12-21 MED ORDER — FUROSEMIDE 10 MG/ML IJ SOLN
40.0000 mg | Freq: Two times a day (BID) | INTRAMUSCULAR | Status: DC
  Administered 2019-12-21 – 2019-12-22 (×3): 40 mg via INTRAVENOUS
  Filled 2019-12-21 (×3): qty 4

## 2019-12-21 MED ORDER — KETOROLAC TROMETHAMINE 15 MG/ML IJ SOLN
15.0000 mg | Freq: Once | INTRAMUSCULAR | Status: AC
  Administered 2019-12-21: 15 mg via INTRAVENOUS
  Filled 2019-12-21: qty 1

## 2019-12-21 MED ORDER — CARVEDILOL 25 MG PO TABS
25.0000 mg | ORAL_TABLET | Freq: Two times a day (BID) | ORAL | Status: DC
  Administered 2019-12-21 – 2019-12-23 (×5): 25 mg via ORAL
  Filled 2019-12-21 (×5): qty 1

## 2019-12-21 MED ORDER — METHYLPREDNISOLONE SODIUM SUCC 40 MG IJ SOLR
40.0000 mg | Freq: Every day | INTRAMUSCULAR | Status: DC
  Administered 2019-12-21 – 2019-12-23 (×3): 40 mg via INTRAVENOUS
  Filled 2019-12-21 (×3): qty 1

## 2019-12-21 MED ORDER — HYDROCOD POLST-CPM POLST ER 10-8 MG/5ML PO SUER
5.0000 mL | Freq: Every evening | ORAL | Status: DC | PRN
  Administered 2019-12-21 (×3): 5 mL via ORAL
  Filled 2019-12-21 (×3): qty 5

## 2019-12-21 MED ORDER — INSULIN ASPART 100 UNIT/ML ~~LOC~~ SOLN
0.0000 [IU] | Freq: Every day | SUBCUTANEOUS | Status: DC
  Administered 2019-12-21: 5 [IU] via SUBCUTANEOUS
  Administered 2019-12-22: 22:00:00 3 [IU] via SUBCUTANEOUS
  Filled 2019-12-21 (×2): qty 1

## 2019-12-21 MED ORDER — IPRATROPIUM-ALBUTEROL 0.5-2.5 (3) MG/3ML IN SOLN
3.0000 mL | Freq: Four times a day (QID) | RESPIRATORY_TRACT | Status: DC
  Administered 2019-12-21 – 2019-12-22 (×4): 3 mL via RESPIRATORY_TRACT
  Filled 2019-12-21 (×4): qty 3

## 2019-12-21 NOTE — Telephone Encounter (Signed)
-----   Message from Rise Mu, PA-C sent at 12/20/2019 12:27 PM EDT ----- Marykay Lex, can you get him set up to see Dr. Haroldine Laws or Aundra Dubin in [place]?

## 2019-12-21 NOTE — Progress Notes (Signed)
Notified E. Ouma NP for FSBS of 419. Random glucose ordered per protocol.

## 2019-12-21 NOTE — Progress Notes (Signed)
   12/20/19 2221  ReDS Vest / Clip  Station Marker D  Ruler Value 41  ReDS Value Range (!) > 40  ReDS Actual Value 43

## 2019-12-21 NOTE — Progress Notes (Signed)
Patient ID: Gabriel [name]., male   DOB: [birth year], 49 y.o.   MRN: [id number] [facility] PROGRESS NOTE  [name] [id number] DOB: 10/28/1970 DOA: 12/20/2019 PCP: Alisa Graff, FNP  HPI/Subjective: Called this morning about patient having chest pain.  Patient also having shortness of breath.  Patient states he feels rough.  He coughed up a little blood-tinged sputum.  He feels like he is wheezing.  Feels a throbbing pressure in his chest. He is also had abdominal swelling.  Objective: Vitals:   12/21/19 0734 12/21/19 1137  BP: (!) 133/98 114/81  Pulse: 80 87  Resp: 18 18  Temp: 98.8 F (37.1 C)   SpO2: 98% 96%    Intake/Output Summary (Last 24 hours) at 12/21/2019 1559 Last data filed at 12/21/2019 1345 Gross per 24 hour  Intake 240 ml  Output 1500 ml  Net -1260 ml   Filed Weights   12/20/19 0910 12/20/19 2158 12/21/19 0342  Weight: 115.4 kg 112.9 kg 112.4 kg    ROS: Review of Systems  Constitutional: Negative for chills and fever.  Eyes: Negative for blurred vision.  Respiratory: Positive for cough, hemoptysis, sputum production and shortness of breath.   Cardiovascular: Positive for chest pain.  Gastrointestinal: Positive for abdominal pain and nausea. Negative for constipation, diarrhea and vomiting.  Genitourinary: Negative for dysuria.  Musculoskeletal: Negative for joint pain.  Neurological: Negative for dizziness and headaches.   Exam: Physical Exam  Constitutional: He is oriented to person, place, and time.  HENT:  Nose: No mucosal edema.  Mouth/Throat: No oropharyngeal exudate or posterior oropharyngeal edema.  Eyes: Conjunctivae and lids are normal.  Neck: Carotid bruit is not present.  Cardiovascular: S1 normal and S2 normal. Exam reveals no gallop.  No murmur heard. Respiratory: No respiratory distress. He has decreased breath sounds in the right lower field and the left lower field. He has no wheezes. He has no rhonchi. He has no  rales.  GI: Soft. Bowel sounds are normal. There is no abdominal tenderness.  Musculoskeletal:     Right ankle: Swelling present.     Left ankle: Swelling present.  Lymphadenopathy:    He has no cervical adenopathy.  Neurological: He is alert and oriented to person, place, and time. No cranial nerve deficit.  Skin: Skin is warm. No rash noted. Nails show no clubbing.  Psychiatric: He has a normal mood and affect.      Data Reviewed: Basic Metabolic Panel: Recent Labs  Lab 12/20/19 0903 12/21/19 0412  NA 137 136  K 4.1 3.7  CL 105 98  CO2 22 28  GLUCOSE 209* 150*  BUN 23* 29*  CREATININE 1.24 1.40*  CALCIUM 8.7* 8.5*   Liver Function Tests: Recent Labs  Lab 12/20/19 0903  AST 20  ALT 20  ALKPHOS 56  BILITOT 3.4*  PROT 6.6  ALBUMIN 3.4*   CBC: Recent Labs  Lab 12/20/19 0903  WBC 11.9*  NEUTROABS 8.5*  HGB 16.7  HCT 50.4  MCV 83.3  PLT 165   BNP (last 3 results) Recent Labs    09/22/19 0516 11/22/19 0343 12/20/19 0903  BNP 1,178.0* 1,698.0* 2,920.0*    CBG: Recent Labs  Lab 12/20/19 0816 12/20/19 1659 12/20/19 2237 12/21/19 0730 12/21/19 1138  GLUCAP 211* 160* 159* 150* 200*    Recent Results (from the past 240 hour(s))  Respiratory Panel by RT PCR (Flu A&B, Covid) - Nasopharyngeal Swab     Status: None   Collection Time: 12/20/19  10:06 AM   Specimen: Nasopharyngeal Swab  Result Value Ref Range Status   SARS Coronavirus 2 by RT PCR NEGATIVE NEGATIVE Final    Comment: (NOTE) SARS-CoV-2 target nucleic acids are NOT DETECTED. The SARS-CoV-2 RNA is generally detectable in upper respiratoy specimens during the acute phase of infection. The lowest concentration of SARS-CoV-2 viral copies this assay can detect is 131 copies/mL. A negative result does not preclude SARS-Cov-2 infection and should not be used as the sole basis for treatment or other patient management decisions. A negative result may occur with  improper specimen  collection/handling, submission of specimen other than nasopharyngeal swab, presence of viral mutation(s) within the areas targeted by this assay, and inadequate number of viral copies (<131 copies/mL). A negative result must be combined with clinical observations, patient history, and epidemiological information. The expected result is Negative. Fact Sheet for Patients:  PinkCheek.be Fact Sheet for Healthcare Providers:  GravelBags.it This test is not yet ap proved or cleared by the [place] FDA and  has been authorized for detection and/or diagnosis of SARS-CoV-2 by FDA under an Emergency Use Authorization (EUA). This EUA will remain  in effect (meaning this test can be used) for the duration of the COVID-19 declaration under Section 564(b)(1) of the Act, 21 U.S.C. section 360bbb-3(b)(1), unless the authorization is terminated or revoked sooner.    Influenza A by PCR NEGATIVE NEGATIVE Final   Influenza B by PCR NEGATIVE NEGATIVE Final    Comment: (NOTE) The Xpert Xpress SARS-CoV-2/FLU/RSV assay is intended as an aid in  the diagnosis of influenza from Nasopharyngeal swab specimens and  should not be used as a sole basis for treatment. Nasal washings and  aspirates are unacceptable for Xpert Xpress SARS-CoV-2/FLU/RSV  testing. Fact Sheet for Patients: PinkCheek.be Fact Sheet for Healthcare Providers: GravelBags.it This test is not yet approved or cleared by the [place] FDA and  has been authorized for detection and/or diagnosis of SARS-CoV-2 by  FDA under an Emergency Use Authorization (EUA). This EUA will remain  in effect (meaning this test can be used) for the duration of the  Covid-19 declaration under Section 564(b)(1) of the Act, 21  U.S.C. section 360bbb-3(b)(1), unless the authorization is  terminated or revoked. Performed at [facility],  [address] [place], [place] [address]      Studies: DG Chest Portable 1 View  Result Date: 12/20/2019 CLINICAL DATA:  Shortness of breath EXAM: PORTABLE CHEST 1 VIEW COMPARISON:  November 22, 2019. FINDINGS: There is cardiomegaly with mild pulmonary venous hypertension. There is atelectatic change in the bases and left mid lung. There is bilateral lower lung interstitial edema. No airspace consolidation. No adenopathy. No bone lesions. IMPRESSION: Cardiomegaly with pulmonary vascular congestion. Lower lung interstitial edema. Suspect a degree of congestive heart failure. Areas of atelectatic change without consolidation noted. No adenopathy appreciable. Electronically Signed   By: Lowella Grip III M.D.   On: 12/20/2019 09:32    Scheduled Meds: . aspirin  81 mg Oral Daily  . benzonatate  200 mg Oral TID  . carvedilol  25 mg Oral BID WC  . colchicine  0.6 mg Oral Daily  . enoxaparin (LOVENOX) injection  40 mg Subcutaneous Q24H  . furosemide  40 mg Intravenous BID  . insulin aspart  0-15 Units Subcutaneous TID WC  . insulin aspart  4 Units Subcutaneous TID WC  . ipratropium-albuterol  3 mL Nebulization Q6H  . methylPREDNISolone (SOLU-MEDROL) injection  40 mg Intravenous Daily  . rosuvastatin  20 mg Oral Daily  . sacubitril-valsartan  1 tablet Oral BID  . sodium chloride flush  3 mL Intravenous Q12H  . spironolactone  25 mg Oral BID   Continuous Infusions: . sodium chloride      Assessment/Plan:  1. Acute systolic congestive heart failure.  Continue Lasix 40 mg IV twice daily.  On Entresto, spironolactone and Coreg.  Continue to monitor closely.  As per cardiology patient is 22 pounds over baseline weight.  Daily weights.  Check pulse ox on room air with ambulation. 2. Chest pain likely costochondritis.  1 dose of Toradol.  Then I will give Solu-Medrol daily. 3. 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  Watch creatinine closely with diuresis.  Hemoglobin  A1c elevated 8.7.  Sugars will likely be higher with steroids.  On sliding scale insulin. 4. COPD.  Currently not hearing a wheeze but I am giving Solu-Medrol for costochondritis.  Ordered nebulizer treatments also.  Code Status:     Code Status Orders  (From admission, onward)         Start     Ordered   12/20/19 1216  Full code  Continuous     12/20/19 1216        Code Status History    Date Active Date Inactive Code Status Order ID Comments User Context   11/22/2019 0619 11/24/2019 1834 Full Code [id number]  Athena Masse, MD ED   09/22/2019 1026 09/23/2019 1703 Full Code [id number]  Vashti Hey, MD Inpatient   06/26/2019 0038 06/27/2019 1729 Full Code [id number]  Mansy, Arvella Merles, MD ED   10/01/2017 0027 10/01/2017 1506 Full Code [id number]  Lance Coon, MD Inpatient   08/21/2017 1350 08/24/2017 1615 Full Code [id number]  Gladstone Lighter, MD Inpatient   04/28/2017 1653 04/29/2017 1755 Full Code [id number]  Gladstone Lighter, MD Inpatient   01/05/2017 1555 01/07/2017 1643 Full Code [id number]  Nicholes Mango, MD ED   11/28/2016 1342 11/29/2016 2259 Full Code [id number]  Demetrios Loll, MD Inpatient   10/28/2016 1358 10/30/2016 1354 Full Code [id number]  Hillary Bow, MD ED   03/15/2016 0758 03/16/2016 1508 Full Code [id number]  Harrie Foreman, MD Inpatient   02/16/2016 0224 02/17/2016 1302 Full Code [id number]  Harrie Foreman, MD Inpatient   01/22/2016 2351 01/23/2016 1641 Full Code [id number]  Demetrios Loll, MD ED   01/03/2016 1422 01/09/2016 1454 Full Code [id number]  Lavetta Nielsen, Aaron Mose, MD ED   05/17/2015 2341 05/19/2015 1731 Full Code [id number]  Lance Coon, MD Inpatient   03/20/2015 0826 03/21/2015 1527 Full Code [id number]  Harrie Foreman, MD Inpatient   Advance Care Planning Activity     Disposition Plan: Likely will need a few more days of IV diuresis, since quite a bit heavier than baseline weight as per cardiology.  Will check pulse ox on room air with ambulation tomorrow.  Goal will be to go home  likely in a few days.  Consultants:  Cardiology  Time spent: 28 minutes  [place]

## 2019-12-21 NOTE — Telephone Encounter (Signed)
Rise Mu, PA-C  Alroy Dust.Deanna Wiater, RN  Discharge wont be at least for another 24, possibly 48 hours       Previous Messages   ----- Message -----  From: Alroy Dust.Daleen Bo, RN  Sent: 12/21/2019 10:10 AM EDT  To: Rise Mu, PA-C   [facility] is working on getting him in. Do you know when his discharge is yet?

## 2019-12-21 NOTE — TOC Transition Note (Signed)
Transition of Care [facility]) - CM/SW Discharge Note   Patient Details  Name: Gabriel [name]. MRN: [id number] Date of Birth: 14-Aug-1971  Transition of Care [facility]) CM/SW Contact:  Eileen Stanford, LCSW Phone Number: 12/21/2019, 2:23 PM   Clinical Narrative:   CSW spoke with pt at bedside. Readmission screening completed. Pt drives himself to appointments. Pt is able to afford all medications and uses [facility] in [place]. Pt has PCP.  Pt may need 02 at d/c. CSW will continue to follow.      Barriers to Discharge: Continued Medical Work up   Patient Goals and CMS Choice Patient states their goals for this hospitalization and ongoing recovery are:: to go home      Discharge Placement                       Discharge Plan and Services     Post Acute Care Choice: NA                               Social Determinants of Health (SDOH) Interventions     Readmission Risk Interventions Readmission Risk Prevention Plan 12/21/2019  Transportation Screening Complete  PCP or Specialist Appt within 3-5 Days Complete  HRI or [place] Complete  Social Work Consult for [place] Planning/Counseling Complete  Palliative Care Screening Not Applicable  Medication Review [employer]) Complete  Some recent data might be hidden

## 2019-12-21 NOTE — Telephone Encounter (Signed)
Pt is currently admitted for HF exacerbation.   Made call to [address] office to see about turn around for new pt [facility] appt. Spoke to [employer] in scheduling, she will reach out to RN for more information.

## 2019-12-21 NOTE — Progress Notes (Signed)
Progress Note  Patient Name: Gabriel [name]. Date of Encounter: 12/21/2019  Primary Cardiologist: Rockey Situ  Subjective   Dyspnea improving, though not at baseline. Slept well with supplemental oxygen overnight. Documented UOP of 5.3 L for the past 24 hours/admission. BUN/SCr 23/1/24-->29.1.40, potassium 3.7. Weight 115.4-->112.4 kg. Remains on supplemental oxygen at 2 L via nasal cannula.   Inpatient Medications    Scheduled Meds: . aspirin  81 mg Oral Daily  . benzonatate  200 mg Oral TID  . cloNIDine  0.1 mg Oral BID  . colchicine  0.6 mg Oral Daily  . enoxaparin (LOVENOX) injection  40 mg Subcutaneous Q24H  . furosemide  40 mg Intravenous Q12H  . insulin aspart  0-15 Units Subcutaneous TID WC  . insulin aspart  4 Units Subcutaneous TID WC  . metoprolol  100 mg Oral Daily  . rosuvastatin  20 mg Oral Daily  . sacubitril-valsartan  1 tablet Oral BID  . sodium chloride flush  3 mL Intravenous Q12H  . spironolactone  25 mg Oral BID   Continuous Infusions: . sodium chloride     PRN Meds: sodium chloride, acetaminophen, chlorpheniramine-HYDROcodone, nitroGLYCERIN, ondansetron (ZOFRAN) IV, sodium chloride flush   Vital Signs    Vitals:   12/20/19 1800 12/20/19 1830 12/20/19 2158 12/21/19 0342  BP: (!) 154/105 (!) 144/92 (!) 168/94 (!) 134/95  Pulse: 89 82 82 81  Resp: (!) 28 (!) 30 (!) 23   Temp:   99.4 F (37.4 C) 99.1 F (37.3 C)  TempSrc:   Oral Oral  SpO2: 96% 95% 98% 97%  Weight:   112.9 kg 112.4 kg  Height:   6' (1.829 m)     Intake/Output Summary (Last 24 hours) at 12/21/2019 0731 Last data filed at 12/21/2019 0620 Gross per 24 hour  Intake --  Output 5300 ml  Net -5300 ml   Filed Weights   12/20/19 0910 12/20/19 2158 12/21/19 0342  Weight: 115.4 kg 112.9 kg 112.4 kg    Telemetry    SR, PVCs with ventricular couplets, 3 episodes of NSVT lasting 4,5, and 6 beats in duration respectively  - Personally Reviewed  ECG    No new tracings - Personally  Reviewed  Physical Exam   GEN: No acute distress.   Neck: JVD elevated to the angle of the mandible. Cardiac: RRR, no murmurs, rubs, or gallops.  Respiratory: Diminished breath sounds bilaterally with faint crackles along the bilateral bases.  GI: Soft, nontender, mildly distended.   MS: No edema; No deformity. Neuro:  Alert and oriented x 3; Nonfocal.  Psych: Normal affect.  Labs    Chemistry Recent Labs  Lab 12/20/19 0903 12/21/19 0412  NA 137 136  K 4.1 3.7  CL 105 98  CO2 22 28  GLUCOSE 209* 150*  BUN 23* 29*  CREATININE 1.24 1.40*  CALCIUM 8.7* 8.5*  PROT 6.6  --   ALBUMIN 3.4*  --   AST 20  --   ALT 20  --   ALKPHOS 56  --   BILITOT 3.4*  --   GFRNONAA >60 58*  GFRAA >60 >60  ANIONGAP 10 10     Hematology Recent Labs  Lab 12/20/19 0903  WBC 11.9*  RBC 6.05*  HGB 16.7  HCT 50.4  MCV 83.3  MCH 27.6  MCHC 33.1  RDW 14.8  PLT 165    Cardiac EnzymesNo results for input(s): TROPONINI in the last 168 hours. No results for input(s): TROPIPOC in the last  168 hours.   BNP Recent Labs  Lab 12/20/19 0903  BNP 2,920.0*     DDimer No results for input(s): DDIMER in the last 168 hours.   Radiology    DG Chest Portable 1 View  Result Date: 12/20/2019 IMPRESSION: Cardiomegaly with pulmonary vascular congestion. Lower lung interstitial edema. Suspect a degree of congestive heart failure. Areas of atelectatic change without consolidation noted. No adenopathy appreciable. Electronically Signed   By: Lowella Grip III M.D.   On: 12/20/2019 09:32    Cardiac Studies   2D echo 11/23/2019: 1. Left ventricular ejection fraction, by estimation, is 25 to 30%. The  left ventricle has severely decreased function. The left ventricle  demonstrates global hypokinesis. The left ventricular internal cavity size  was mildly dilated. There is mild left  ventricular hypertrophy. Left ventricular diastolic parameters are  consistent with Grade III diastolic  dysfunction (restrictive). Elevated  left atrial pressure.  2. Right ventricular systolic function is moderately reduced. The right  ventricular size is mildly enlarged. There is moderately elevated  pulmonary artery systolic pressure.  3. Left atrial size was mild to moderately dilated.  4. Right atrial size was mildly dilated.  5. The mitral valve is normal in structure. Trivial mitral valve  regurgitation. No evidence of mitral stenosis.  6. The aortic valve is tricuspid. Aortic valve regurgitation is mild to  moderate. Mild aortic valve sclerosis is present, with no evidence of  aortic valve stenosis.  7. Aortic dilatation noted. There is borderline dilatation of the aortic  root measuring 38 mm.  8. The inferior vena cava is dilated in size with <50% respiratory  variability, suggesting right atrial pressure of 15 mmHg.  __________  2D echo 06/2019: 1. Left ventricular ejection fraction, by visual estimation, is 30 to  35%. The left ventricle has moderately decreased function. There is no  left ventricular hypertrophy.  2. Definity contrast agent was given IV to delineate the left ventricular  endocardial borders.  3. The left ventricle demonstrates global hypokinesis.  4. Global right ventricle has normal systolic function.The right  ventricular size is mildly enlarged. No increase in right ventricular wall  thickness.  5. Left atrial size was moderately dilated.  6. Moderately elevated pulmonary artery systolic pressure.  7. The tricuspid regurgitant velocity is 3.10 m/s, and with an assumed  right atrial pressure of 20 mmHg, the estimated right ventricular systolic  pressure is moderately elevated at 58.4 mmHg.  8. Moderately dilated left ventricular internal cavity size. __________  2D echo 04/2017: - Left ventricle: The cavity size was normal. Wall thickness was  increased in a pattern of mild LVH. Systolic function was mildly  reduced. The  estimated ejection fraction was in the range of 45%  to 50%. Doppler parameters are consistent with abnormal left  ventricular relaxation (grade 1 diastolic dysfunction).  - Aortic valve: Probably trileaflet; mildly thickened, mildly  calcified leaflets. There was mild regurgitation directed  eccentrically in the LVOT.  - Left atrium: The atrium was mildly dilated.  - Right ventricle: The cavity size was normal. Systolic function  was normal.  - Pericardium, extracardiac: A trivial pericardial effusion was  identified posterior to the heart.  __________  [facility] 12/2016:  There is moderate to severe left ventricular systolic dysfunction.  LV end diastolic pressure is moderately elevated.  The left ventricular ejection fraction is 25-35% by visual estimate.  Dist RCA lesion, 40 %stenosed.  Prox RCA to Mid RCA lesion, 40 %stenosed.  1. Mild nonobstructive coronary  artery disease. 2. Moderately to severely reduced LV systolic function with an EF of 30-35%. 3. Moderately elevated left ventricular end-diastolic pressure. LVEDP was in the high 20s.  Recommendations: Continue medical therapy for nonobstructive coronary artery disease. The patient has nonischemic cardiomyopathy. He is volume overloaded and I increased torsemide to 40 mg by mouth twice daily. Consider adding spironolactone or eplerenone in the near future.  Patient Profile     49 y.o. 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 Acute hypoxic respiratory distress secondary to acute on chronic HFrEF secondary to NICM: -He remains volume up from his dry weight of 232 pounds, though is improving -Continue with IV Lasix with KCl repletion as indicated -Maximize escalation of  GDMT with discontinuation of clonidine and Toprol-XL -Start carvedilol 25 mg twice daily -Continue maximum dose Entresto and spironolactone -We will go ahead and get him set up to be seen in the advanced heart failure clinic in [place] for further evaluation and management including possible CardioMEMS given frequent ER visits/hospitalizations -Ambulate to assess need for continued supplemental oxygen, wean as tolerated/able -Daily weights -Strict I's and O's -CHF education  2.  Nonobstructive CAD with elevated high-sensitivity troponin: -He does note bandlike chest tightness which is likely in setting of volume overload -Prior cath showed nonobstructive disease as outlined above -Initial high-sensitivity troponin of 61 which is consistent with his prior values with a delta of 62 -No indication for heparin drip at this time without dynamic elevation in troponin -ASA -No plans for inpatient ischemic evaluation at this time   3.  CKD stage III: -Renal function is at his ~ baseline -Monitor with diuresis  4.  HTN: -Likely playing a role in his above exacerbation -BP should improve some with diuresis  -Continue medications as outlined above with recommendation to escalate as indicated  5.  COPD: -Does not appear to be active COPD exacerbation -Received DuoNeb in the ED  For questions or updates, please contact [place] Please consult [email or web address] for contact info under Cardiology/STEMI.    Signed, Christell Faith, PA-C [place] Pager: [phone] 12/21/2019, 7:31 AM

## 2019-12-21 NOTE — Telephone Encounter (Signed)
-----   Message from Rise Mu, PA-C sent at 12/20/2019 12:27 PM EDT ----- Marykay Lex, can you get him set up to see Dr. Haroldine Laws or Aundra Dubin in [place]?

## 2019-12-22 ENCOUNTER — Ambulatory Visit: Payer: Medicaid Other | Admitting: Family

## 2019-12-22 DIAGNOSIS — E1122 Type 2 diabetes mellitus with diabetic chronic kidney disease: Secondary | ICD-10-CM

## 2019-12-22 LAB — BASIC METABOLIC PANEL
Anion gap: 10 (ref 5–15)
BUN: 30 mg/dL — ABNORMAL HIGH (ref 6–20)
CO2: 26 mmol/L (ref 22–32)
Calcium: 8.5 mg/dL — ABNORMAL LOW (ref 8.9–10.3)
Chloride: 97 mmol/L — ABNORMAL LOW (ref 98–111)
Creatinine, Ser: 1.13 mg/dL (ref 0.61–1.24)
GFR calc Af Amer: >60 mL/min (ref >60)
GFR calc non Af Amer: >60 mL/min (ref >60)
Glucose, Bld: 177 mg/dL — ABNORMAL HIGH (ref 70–99)
Potassium: 4 mmol/L (ref 3.5–5.1)
Sodium: 133 mmol/L — ABNORMAL LOW (ref 135–145)

## 2019-12-22 LAB — GLUCOSE, CAPILLARY
Glucose-Capillary: 167 mg/dL — ABNORMAL HIGH (ref 70–99)
Glucose-Capillary: 169 mg/dL — ABNORMAL HIGH (ref 70–99)
Glucose-Capillary: 306 mg/dL — ABNORMAL HIGH (ref 70–99)
Glucose-Capillary: 292 mg/dL — ABNORMAL HIGH (ref 70–99)

## 2019-12-22 MED ORDER — MORPHINE SULFATE (PF) 2 MG/ML IV SOLN
2.0000 mg | INTRAVENOUS | Status: DC | PRN
  Administered 2019-12-22 (×3): 2 mg via INTRAVENOUS
  Filled 2019-12-22 (×3): qty 1

## 2019-12-22 MED ORDER — INSULIN ASPART 100 UNIT/ML ~~LOC~~ SOLN
7.0000 [IU] | Freq: Three times a day (TID) | SUBCUTANEOUS | Status: DC
  Administered 2019-12-22 – 2019-12-23 (×3): 7 [IU] via SUBCUTANEOUS
  Filled 2019-12-22 (×3): qty 1

## 2019-12-22 MED ORDER — IPRATROPIUM-ALBUTEROL 0.5-2.5 (3) MG/3ML IN SOLN: 3.0000 mL | Freq: Four times a day (QID) | RESPIRATORY_TRACT | Status: DC | PRN

## 2019-12-22 NOTE — Progress Notes (Signed)
PROGRESS NOTE    Gabriel [name].  [id number] DOB: 19-Jul-1971 DOA: 12/20/2019 PCP: Alisa Graff, FNP   Brief Narrative:   Gabriel [name]. is a 49 y.o. male with medical history significantfor nonischemic cardiomyopathy, nicotine dependence,chronic systolic heart failure (LVEF 30 - 35%),HTN, DM2,chronic chest wall pain, nonobstructive coronary artery disease, CKD2, noncompliance with medication who presentsto the emergency room for evaluation of shortness of breath that has progressively worsened over the last 3 days associated with orthopnea and paroxysmal nocturnal dyspnea. Patient states that he has gained 20 pounds in the last 4 days.  Mated for acute on chronic HFrEF.  Subjective: Patient was complaining of right upper quadrant pain involving the right side of chest.  Having mild nausea with no vomiting.  Pain increased with deep breathing. Still having some shortness of breath but stating that is improving.  Assessment & Plan:   Principal Problem:   Acute on chronic combined systolic and diastolic CHF (congestive heart failure) (HCC) Active Problems:   Essential hypertension   COPD (chronic obstructive pulmonary disease) (HCC)   NICM (nonischemic cardiomyopathy) (HCC)   Acute on chronic systolic CHF (congestive heart failure) (HCC)   Tobacco use   Noncompliance with medications   CKD stage 3 due to type 2 diabetes mellitus (HCC)   Acute on chronic combined systolic (congestive) and diastolic (congestive) heart failure (HCC)   Costochondritis   Acute respiratory failure with hypoxia (HCC)  Acute on chronic combined systolic and diastolic heart failure. Diuresing well with total net of -10 L.  Weight decreased to 240 pound today from 248 on admission.  Per patient his baseline weight is about 235 to 240 pounds. Dyspnea seems improving.  Renal functions improving. Cardiology is following-appreciate their recommendations. -Continue IV Lasix. -Continue with  metoprolol, Entresto and spironolactone. -Continue with daily BMP. -Continue with strict intake and output. -Daily weight. -Try to wean him off from oxygen, if unable to wean him off then we will ambulate with pulse ox tomorrow.  RUQ pain.  Patient was complaining of right-sided chest and right upper quadrant pain.  There was some tenderness on exam. -Abdominal ultrasound to rule out cholelithiasis/cholecystitis.  CKD stage IIIa.  Creatinine seems improving with diuresis. -Continue to monitor. -Avoid nephrotoxins.  Type 2 diabetes.  Uncontrolled with hyperglycemia.  A1c of 8.7. CBG elevated as patient was started on steroid due to concern of costochondritis. -Increase NovoLog to 7 units with meal. -Continue with sliding scale.  Hypertension.  Blood pressure within goal. -Continue metoprolol, clonidine and spironolactone.  COPD.  No current concern of any exacerbation. -Continue with as needed nebulizer treatments.  Objective: Vitals:   12/22/19 0724 12/22/19 0814 12/22/19 1201 12/22/19 1544  BP: 125/82  113/82 124/86  Pulse: 78  81 79  Resp: 18  18 19   Temp: 98.4 F (36.9 C)  98 F (36.7 C) 98.3 F (36.8 C)  TempSrc:   Oral Oral  SpO2: 98% 98% 98% 97%  Weight:      Height:        Intake/Output Summary (Last 24 hours) at 12/22/2019 1651 Last data filed at 12/22/2019 1543 Gross per 24 hour  Intake 720 ml  Output 4575 ml  Net -3855 ml   Filed Weights   12/20/19 2158 12/21/19 0342 12/22/19 0422  Weight: 112.9 kg 112.4 kg 109.2 kg    Examination:  General exam: Appears calm and comfortable  Respiratory system: Clear to auscultation. Respiratory effort normal. Cardiovascular system: S1 & S2 heard, RRR.  No JVD, murmurs, rubs, gallops or clicks. Gastrointestinal system: Soft, RUQ tenderness, nondistended, bowel sounds positive. Central nervous system: Alert and oriented. No focal neurological deficits.Symmetric 5 x 5 power. Extremities: No edema, no cyanosis, pulses  intact and symmetrical. Skin: No rashes, lesions or ulcers Psychiatry: Judgement and insight appear normal.    DVT prophylaxis: Lovenox Code Status: Full Family Communication: Cussed with patient Disposition Plan:  Status is: Inpatient  Remains inpatient appropriate because:IV treatments appropriate due to intensity of illness or inability to take PO   Dispo: The patient is from: Home              Anticipated d/c is to: Home              Anticipated d/c date is: 2 days              Patient currently is not medically stable to d/c.  Patient is getting IV diuresis.  Consultants:   Cardiology  Procedures:  Antimicrobials:   Data Reviewed: I have personally reviewed following labs and imaging studies  CBC: Recent Labs  Lab 12/20/19 0903  WBC 11.9*  NEUTROABS 8.5*  HGB 16.7  HCT 50.4  MCV 83.3  PLT 123XX123   Basic Metabolic Panel: Recent Labs  Lab 12/20/19 0903 12/21/19 0412 12/21/19 2143 12/22/19 0625  NA 137 136  --  133*  K 4.1 3.7  --  4.0  CL 105 98  --  97*  CO2 22 28  --  26  GLUCOSE 209* 150* 373* 177*  BUN 23* 29*  --  30*  CREATININE 1.24 1.40*  --  1.13  CALCIUM 8.7* 8.5*  --  8.5*   GFR: Estimated Creatinine Clearance: 99.8 mL/min (by C-G formula based on SCr of 1.13 mg/dL). Liver Function Tests: Recent Labs  Lab 12/20/19 0903  AST 20  ALT 20  ALKPHOS 56  BILITOT 3.4*  PROT 6.6  ALBUMIN 3.4*   No results for input(s): LIPASE, AMYLASE in the last 168 hours. No results for input(s): AMMONIA in the last 168 hours. Coagulation Profile: No results for input(s): INR, PROTIME in the last 168 hours. Cardiac Enzymes: No results for input(s): CKTOTAL, CKMB, CKMBINDEX, TROPONINI in the last 168 hours. BNP (last 3 results) No results for input(s): PROBNP in the last 8760 hours. HbA1C: No results for input(s): HGBA1C in the last 72 hours. CBG: Recent Labs  Lab 12/21/19 1609 12/21/19 2105 12/22/19 0723 12/22/19 1200 12/22/19 1639  GLUCAP 309*  419* 167* 169* 306*   Lipid Profile: No results for input(s): CHOL, HDL, LDLCALC, TRIG, CHOLHDL, LDLDIRECT in the last 72 hours. Thyroid Function Tests: No results for input(s): TSH, T4TOTAL, FREET4, T3FREE, THYROIDAB in the last 72 hours. Anemia Panel: No results for input(s): VITAMINB12, FOLATE, FERRITIN, TIBC, IRON, RETICCTPCT in the last 72 hours. Sepsis Labs: No results for input(s): PROCALCITON, LATICACIDVEN in the last 168 hours.  Recent Results (from the past 240 hour(s))  Respiratory Panel by RT PCR (Flu A&B, Covid) - Nasopharyngeal Swab     Status: None   Collection Time: 12/20/19 10:06 AM   Specimen: Nasopharyngeal Swab  Result Value Ref Range Status   SARS Coronavirus 2 by RT PCR NEGATIVE NEGATIVE Final    Comment: (NOTE) SARS-CoV-2 target nucleic acids are NOT DETECTED. The SARS-CoV-2 RNA is generally detectable in upper respiratoy specimens during the acute phase of infection. The lowest concentration of SARS-CoV-2 viral copies this assay can detect is 131 copies/mL. A negative result does not preclude  SARS-Cov-2 infection and should not be used as the sole basis for treatment or other patient management decisions. A negative result may occur with  improper specimen collection/handling, submission of specimen other than nasopharyngeal swab, presence of viral mutation(s) within the areas targeted by this assay, and inadequate number of viral copies (<131 copies/mL). A negative result must be combined with clinical observations, patient history, and epidemiological information. The expected result is Negative. Fact Sheet for Patients:  PinkCheek.be Fact Sheet for Healthcare Providers:  GravelBags.it This test is not yet ap proved or cleared by the [place] FDA and  has been authorized for detection and/or diagnosis of SARS-CoV-2 by FDA under an Emergency Use Authorization (EUA). This EUA will remain  in  effect (meaning this test can be used) for the duration of the COVID-19 declaration under Section 564(b)(1) of the Act, 21 U.S.C. section 360bbb-3(b)(1), unless the authorization is terminated or revoked sooner.    Influenza A by PCR NEGATIVE NEGATIVE Final   Influenza B by PCR NEGATIVE NEGATIVE Final    Comment: (NOTE) The Xpert Xpress SARS-CoV-2/FLU/RSV assay is intended as an aid in  the diagnosis of influenza from Nasopharyngeal swab specimens and  should not be used as a sole basis for treatment. Nasal washings and  aspirates are unacceptable for Xpert Xpress SARS-CoV-2/FLU/RSV  testing. Fact Sheet for Patients: PinkCheek.be Fact Sheet for Healthcare Providers: GravelBags.it This test is not yet approved or cleared by the [place] FDA and  has been authorized for detection and/or diagnosis of SARS-CoV-2 by  FDA under an Emergency Use Authorization (EUA). This EUA will remain  in effect (meaning this test can be used) for the duration of the  Covid-19 declaration under Section 564(b)(1) of the Act, 21  U.S.C. section 360bbb-3(b)(1), unless the authorization is  terminated or revoked. Performed at [facility] [address], [place], [place] [address]      Radiology Studies: No results found.  Scheduled Meds: . aspirin  81 mg Oral Daily  . benzonatate  200 mg Oral TID  . carvedilol  25 mg Oral BID WC  . colchicine  0.6 mg Oral Daily  . enoxaparin (LOVENOX) injection  40 mg Subcutaneous Q24H  . furosemide  40 mg Intravenous BID  . insulin aspart  0-15 Units Subcutaneous TID WC  . insulin aspart  0-5 Units Subcutaneous QHS  . insulin aspart  7 Units Subcutaneous TID WC  . methylPREDNISolone (SOLU-MEDROL) injection  40 mg Intravenous Daily  . rosuvastatin  20 mg Oral Daily  . sacubitril-valsartan  1 tablet Oral BID  . sodium chloride flush  3 mL Intravenous Q12H  . spironolactone  25 mg Oral BID    Continuous Infusions: . sodium chloride       LOS: 2 days   Time spent: 40 minutes.  Lorella Nimrod, MD [facility]  If 7PM-7AM, please contact night-coverage [email or web address]  12/22/2019, 4:51 PM   This record has been created using Systems analyst. Errors have been sought and corrected,but may not always be located. Such creation errors do not reflect on the standard of care.

## 2019-12-22 NOTE — Progress Notes (Addendum)
Inpatient Diabetes Program Recommendations  AACE/ADA: New Consensus Statement on Inpatient Glycemic Control   Target Ranges:  Prepandial:   less than 140 mg/dL      Peak postprandial:   less than 180 mg/dL (1-2 hours)      Critically ill patients:  140 - 180 mg/dL  Results for Gabriel, [name] (MRN [id number]) as of 12/22/2019 08:00  Ref. Range 12/21/2019 07:30 12/21/2019 11:38 12/21/2019 16:09 12/21/2019 21:05 12/22/2019 07:23  Glucose-Capillary Latest Ref Range: 70 - 99 mg/dL 150 (H) 200 (H) 309 (H) 419 (H) 167 (H)   Results for Gabriel, [name] (MRN [id number]) as of 12/22/2019 08:00  Ref. Range 11/23/2019 04:35  Hemoglobin A1C Latest Ref Range: 4.8 - 5.6 % 8.7 (H)   Review of Glycemic Control  Diabetes history: DM2 Outpatient Diabetes medications: Levemir (none in several months due to cost) Current orders for Inpatient glycemic control: Novolog 0-15 units TID with meals, Novolog 0-5 units QHS, Novolog 4 units TID with meals; Solumedrol 40 mg daily  Inpatient Diabetes Program Recommendations:   Insulin-Meal Coverage: If steroids are continued, please consider increasing meal coverage to Novolog 7 units TID with meals.  A1C: A1C 8.7% on 11/23/19 indicating an average glucose of 203 mg/dl. Patient reports that he has not taken Levemir in months due to cost. Patient will need DM medication prescribed at time of discharge.   NOTE: Spoke with patient over the phone about diabetes and home regimen for diabetes control. Patient reports he was seeing [name], FNP but he states that she can not prescribe his DM medications and he needs to get a new provider. Patient was going to [facility] but he did not like it because his visits were over the phone or virtual and he would prefer to go to a different clinic. Patient is interested in getting established with [facility] Patient states he was taking Levemir (insulin pens) but he has not taken it in months due to cost. Patient was dx with DM in  2016 and was initially prescribed Metformin but he could not tolerate taking Metformin so he was changed to Levemir.   Patient reports he has a glucometer but not checking glucose consistently but when he has checked glucose in the past few weeks, patient states glucose ranges from 150-200's mg/dl.  Discussed A1C results (8.7% on 11/23/19 ) and explained that current A1C indicates an average glucose of 203 mg/dl over the past 2-3 months. Discussed glucose and A1C goals. Discussed importance of checking CBGs and maintaining good CBG control to prevent long-term and short-term complications. Explained how hyperglycemia leads to damage within blood vessels which lead to the common complications seen with uncontrolled diabetes. Stressed to the patient the importance of improving glycemic control to prevent further complications from uncontrolled diabetes. Discussed [facility] and informed patient that [facility] would be consulted and asked to assist with getting an appointment at [facility] and with getting medications from [facility]. Discussed generic insulins at [facility] (NOVOLIN NPH, 70/30, Regular) which are $25 per vial or $43 per box of 5 insulin pens.  Explained that if he is having a difficult time affording insulin, he may want to ask provider about switching to generic insulin which is much more affordable.   Patient verbalized understanding of information discussed and reports no further questions at this time related to diabetes. Will call [facility] to see which insulins they have available.  Addendum 12/22/19@13 :00-Went by to see patient. Patient lying  in bed and guarding right side of abdomen and notes that he began hurting there after eating. Patient states MD is aware and patient has already received pain medication. Informed patient that [facility] is consulted and should be assisting with arranging follow up and with medication needs. Provided patient with hand out on  Reli-On products at [facility] which has the generic insulin I spoke with him about earlier. Patient states he can use vial/or insulin pens but he would prefer to use pens if available.  Informed patient diabetes coordinator will follow along while inpatient. Patient verbalized understanding of information discussed and states he has no questions or concerns at this time. Called [facility] and they have Basaglar insulin pens, Humalog insulin pens, and NPH in vials.    Thanks, Barnie Alderman, RN, MSN, CDE Diabetes Coordinator Inpatient Diabetes Program [phone] (Team Pager)

## 2019-12-22 NOTE — Progress Notes (Signed)
Progress Note  Patient Name: Gabriel [name]. Date of Encounter: 12/22/2019  Primary Cardiologist: [name], MD   Subjective   Patient states doing okay, feels much better compared to admission with improvement in shortness of breath.  Breathing treatment also help with shortness of breath.  Inpatient Medications    Scheduled Meds: . aspirin  81 mg Oral Daily  . benzonatate  200 mg Oral TID  . carvedilol  25 mg Oral BID WC  . colchicine  0.6 mg Oral Daily  . enoxaparin (LOVENOX) injection  40 mg Subcutaneous Q24H  . furosemide  40 mg Intravenous BID  . insulin aspart  0-15 Units Subcutaneous TID WC  . insulin aspart  0-5 Units Subcutaneous QHS  . insulin aspart  7 Units Subcutaneous TID WC  . ipratropium-albuterol  3 mL Nebulization Q6H  . methylPREDNISolone (SOLU-MEDROL) injection  40 mg Intravenous Daily  . rosuvastatin  20 mg Oral Daily  . sacubitril-valsartan  1 tablet Oral BID  . sodium chloride flush  3 mL Intravenous Q12H  . spironolactone  25 mg Oral BID   Continuous Infusions: . sodium chloride     PRN Meds: sodium chloride, acetaminophen, chlorpheniramine-HYDROcodone, nitroGLYCERIN, ondansetron (ZOFRAN) IV, sodium chloride flush   Vital Signs    Vitals:   12/22/19 0422 12/22/19 0724 12/22/19 0814 12/22/19 1201  BP: 94/75 125/82  113/82  Pulse: 82 78  81  Resp: 18 18  18   Temp: 98.1 F (36.7 C) 98.4 F (36.9 C)  98 F (36.7 C)  TempSrc: Oral     SpO2: 92% 98% 98% 98%  Weight: 109.2 kg     Height:        Intake/Output Summary (Last 24 hours) at 12/22/2019 1222 Last data filed at 12/22/2019 0904 Gross per 24 hour  Intake 720 ml  Output 3700 ml  Net -2980 ml   Last 3 Weights 12/22/2019 12/21/2019 12/20/2019  Weight (lbs) 240 lb 12.8 oz 247 lb 14.4 oz 248 lb 14.4 oz  Weight (kg) 109.226 kg 112.447 kg 112.9 kg      Telemetry    Sinus rhythm heart rate 77- Personally Reviewed  ECG    No new tracing obtained  Physical Exam   GEN: No acute  distress.   Neck: No JVD Cardiac: RRR, no murmurs, rubs, or gallops.  Respiratory:  Wheezing noted at bases GI: Soft, nontender, non-distended  MS: No edema; No deformity. Neuro:  Nonfocal  Psych: Normal affect   Labs    High Sensitivity Troponin:   Recent Labs  Lab 12/20/19 0903 12/20/19 1136  TROPONINIHS 61* 62*      Chemistry Recent Labs  Lab 12/20/19 0903 12/20/19 0903 12/21/19 0412 12/21/19 2143 12/22/19 0625  NA 137  --  136  --  133*  K 4.1  --  3.7  --  4.0  CL 105  --  98  --  97*  CO2 22  --  28  --  26  GLUCOSE 209*   < > 150* 373* 177*  BUN 23*  --  29*  --  30*  CREATININE 1.24  --  1.40*  --  1.13  CALCIUM 8.7*  --  8.5*  --  8.5*  PROT 6.6  --   --   --   --   ALBUMIN 3.4*  --   --   --   --   AST 20  --   --   --   --  ALT 20  --   --   --   --   ALKPHOS 56  --   --   --   --   BILITOT 3.4*  --   --   --   --   GFRNONAA >60  --  58*  --  >60  GFRAA >60  --  >60  --  >60  ANIONGAP 10  --  10  --  10   < > = values in this interval not displayed.     Hematology Recent Labs  Lab 12/20/19 0903  WBC 11.9*  RBC 6.05*  HGB 16.7  HCT 50.4  MCV 83.3  MCH 27.6  MCHC 33.1  RDW 14.8  PLT 165    BNP Recent Labs  Lab 12/20/19 0903  BNP 2,920.0*     DDimer No results for input(s): DDIMER in the last 168 hours.   Radiology    No results found.  Cardiac Studies   echo 11/23/2019: 1. Left ventricular ejection fraction, by estimation, is 25 to 30%. The  left ventricle has severely decreased function. The left ventricle  demonstrates global hypokinesis. The left ventricular internal cavity size  was mildly dilated. There is mild left  ventricular hypertrophy. Left ventricular diastolic parameters are  consistent with Grade III diastolic dysfunction (restrictive). Elevated  left atrial pressure.  2. Right ventricular systolic function is moderately reduced. The right  ventricular size is mildly enlarged. There is moderately elevated    pulmonary artery systolic pressure.  3. Left atrial size was mild to moderately dilated.  4. Right atrial size was mildly dilated.  5. The mitral valve is normal in structure. Trivial mitral valve  regurgitation. No evidence of mitral stenosis.  6. The aortic valve is tricuspid. Aortic valve regurgitation is mild to  moderate. Mild aortic valve sclerosis is present, with no evidence of  aortic valve stenosis.  7. Aortic dilatation noted. There is borderline dilatation of the aortic  root measuring 38 mm.  8. The inferior vena cava is dilated in size with <50% respiratory  variability, suggesting right atrial pressure of 15 mmHg.  Patient Profile     49 y.o. male with nonischemic cardiomyopathy last EF 25 to 30%, diabetes, CKD stage III being seen due to volume overload and heart failure.  Assessment & Plan    1.  Nonischemic cardiomyopathy, EF 25 to 30% -Net -4.8 L over the past 24 hours -Continue diuresing with IV Lasix.  Probably 1-2 more days. -Continue Coreg, Entresto, Aldactone. -Creatinine improving with diuresing. -Outpatient referral to heart failure clinic upon discharge.  2.  History of CKD -Creatinine is improving with diuresing. -Continue to monitor creatinine  3.  History of COPD -Management as per primary team, DuoNebs.  Total encounter time 71mins or more  Greater than 50% was spent in counseling and coordination of care with the patient       Signed, Kate Sable, MD  12/22/2019, 12:22 PM

## 2019-12-23 ENCOUNTER — Inpatient Hospital Stay: Payer: Medicaid Other

## 2019-12-23 LAB — BASIC METABOLIC PANEL
Anion gap: 10 (ref 5–15)
BUN: 36 mg/dL — ABNORMAL HIGH (ref 6–20)
CO2: 26 mmol/L (ref 22–32)
Calcium: 8.5 mg/dL — ABNORMAL LOW (ref 8.9–10.3)
Chloride: 94 mmol/L — ABNORMAL LOW (ref 98–111)
Creatinine, Ser: 1.27 mg/dL — ABNORMAL HIGH (ref 0.61–1.24)
GFR calc Af Amer: >60 mL/min (ref >60)
GFR calc non Af Amer: >60 mL/min (ref >60)
Glucose, Bld: 226 mg/dL — ABNORMAL HIGH (ref 70–99)
Potassium: 4.2 mmol/L (ref 3.5–5.1)
Sodium: 130 mmol/L — ABNORMAL LOW (ref 135–145)

## 2019-12-23 LAB — GLUCOSE, CAPILLARY
Glucose-Capillary: 203 mg/dL — ABNORMAL HIGH (ref 70–99)
Glucose-Capillary: 301 mg/dL — ABNORMAL HIGH (ref 70–99)

## 2019-12-23 MED ORDER — CARVEDILOL 25 MG PO TABS: 25.0000 mg | ORAL_TABLET | Freq: Two times a day (BID) | ORAL | 0 refills | Status: DC

## 2019-12-23 MED ORDER — BENZONATATE 200 MG PO CAPS: 200.0000 mg | ORAL_CAPSULE | Freq: Three times a day (TID) | ORAL | 0 refills | Status: DC

## 2019-12-23 NOTE — Plan of Care (Signed)
  Problem: Education: Goal: Knowledge of General Education information will improve Description: Including pain rating scale, medication(s)/side effects and non-pharmacologic comfort measures Outcome: Not Progressing   

## 2019-12-23 NOTE — Progress Notes (Signed)
Inpatient Diabetes Program Recommendations  AACE/ADA: New Consensus Statement on Inpatient Glycemic Control   Target Ranges:  Prepandial:   less than 140 mg/dL      Peak postprandial:   less than 180 mg/dL (1-2 hours)      Critically ill patients:  140 - 180 mg/dL   Results for Gabriel [name], Gabriel [name] (MRN [id number]) as of 12/23/2019 09:28  Ref. Range 12/22/2019 07:23 12/22/2019 12:00 12/22/2019 16:39 12/22/2019 21:08 12/23/2019 07:12  Glucose-Capillary Latest Ref Range: 70 - 99 mg/dL 167 (H) 169 (H) 306 (H) 292 (H) 203 (H)   Review of Glycemic Control  Diabetes history: DM2 Outpatient Diabetes medications: Levemir (none in several months due to cost) Current orders for Inpatient glycemic control: Novolog 0-15 units TID with meals, Novolog 0-5 units QHS, Novolog 7 units TID with meals; Solumedrol 40 mg daily  Inpatient Diabetes Program Recommendations:   Insulin-Basal: If steroids are continued, please consider ordering Levemir 10 units Q24H.   A1C: A1C 8.7% on 11/23/19 indicating an average glucose of 203 mg/dl. Patient reports that he has not taken Levemir in months due to cost. Patient will need DM medication prescribed at time of discharge and will need medication assistance from [facility]. They have Basaglar insulin pens [id number]) and Novolin N vials [id number]) if patient is prescribed basal insulin.  Thanks, Barnie Alderman, RN, MSN, CDE Diabetes Coordinator Inpatient Diabetes Program [phone] (Team Pager from 8am to 5pm)

## 2019-12-23 NOTE — Progress Notes (Signed)
Progress Note  Patient Name: Gabriel [name]. Date of Encounter: 12/23/2019  Primary Cardiologist: [name], MD  Subjective   Breathing improved, but not quite back to baseline yet.  Inpatient Medications    Scheduled Meds: . aspirin  81 mg Oral Daily  . benzonatate  200 mg Oral TID  . carvedilol  25 mg Oral BID WC  . colchicine  0.6 mg Oral Daily  . enoxaparin (LOVENOX) injection  40 mg Subcutaneous Q24H  . insulin aspart  0-15 Units Subcutaneous TID WC  . insulin aspart  0-5 Units Subcutaneous QHS  . insulin aspart  7 Units Subcutaneous TID WC  . methylPREDNISolone (SOLU-MEDROL) injection  40 mg Intravenous Daily  . rosuvastatin  20 mg Oral Daily  . sacubitril-valsartan  1 tablet Oral BID  . sodium chloride flush  3 mL Intravenous Q12H  . spironolactone  25 mg Oral BID   Continuous Infusions: . sodium chloride     PRN Meds: sodium chloride, acetaminophen, chlorpheniramine-HYDROcodone, ipratropium-albuterol, morphine injection, nitroGLYCERIN, ondansetron (ZOFRAN) IV, sodium chloride flush   Vital Signs    Vitals:   12/22/19 1544 12/22/19 1918 12/23/19 0349 12/23/19 0710  BP: 124/86 121/77 112/71 119/88  Pulse: 79 84 76 79  Resp: 19 19 19 19   Temp: 98.3 F (36.8 C) 99.2 F (37.3 C) 98.4 F (36.9 C) 98.2 F (36.8 C)  TempSrc: Oral Oral Oral Oral  SpO2: 97% 95% 94% 97%  Weight:   107.7 kg   Height:        Intake/Output Summary (Last 24 hours) at 12/23/2019 1037 Last data filed at 12/23/2019 0807 Gross per 24 hour  Intake 340 ml  Output 3575 ml  Net -3235 ml   Filed Weights   12/21/19 0342 12/22/19 0422 12/23/19 0349  Weight: 112.4 kg 109.2 kg 107.7 kg    Physical Exam   GEN: Well nourished, well developed, in no acute distress.  HEENT: Grossly normal.  Neck: Supple, JVP  ~ 12 cm, no carotid bruits, or masses. Cardiac: RRR, no murmurs, rubs, or gallops. No clubbing, cyanosis, edema.  Radials/DP/PT 2+ and equal bilaterally.  Respiratory:   Respirations regular and unlabored, clear to auscultation bilaterally. GI: Soft, nontender, nondistended, BS + x 4. MS: no deformity or atrophy. Skin: warm and dry, no rash. Neuro:  Strength and sensation are intact. Psych: AAOx3.  Normal affect.  Labs    Chemistry Recent Labs  Lab 12/20/19 [phone] 12/20/19 0903 12/21/19 0412 12/21/19 0412 12/21/19 2143 12/22/19 0625 12/23/19 0532  NA 137   < > 136  --   --  133* 130*  K 4.1   < > 3.7  --   --  4.0 4.2  CL 105   < > 98  --   --  97* 94*  CO2 22   < > 28  --   --  26 26  GLUCOSE 209*   < > 150*   < > 373* 177* 226*  BUN 23*   < > 29*  --   --  30* 36*  CREATININE 1.24   < > 1.40*  --   --  1.13 1.27*  CALCIUM 8.7*   < > 8.5*  --   --  8.5* 8.5*  PROT 6.6  --   --   --   --   --   --   ALBUMIN 3.4*  --   --   --   --   --   --  AST 20  --   --   --   --   --   --   ALT 20  --   --   --   --   --   --   ALKPHOS 56  --   --   --   --   --   --   BILITOT 3.4*  --   --   --   --   --   --   GFRNONAA >60   < > 58*  --   --  >60 >60  GFRAA >60   < > >60  --   --  >60 >60  ANIONGAP 10   < > 10  --   --  10 10   < > = values in this interval not displayed.     Hematology Recent Labs  Lab 12/20/19 0903  WBC 11.9*  RBC 6.05*  HGB 16.7  HCT 50.4  MCV 83.3  MCH 27.6  MCHC 33.1  RDW 14.8  PLT 165    Cardiac Enzymes  Recent Labs  Lab 12/20/19 0903 12/20/19 1136  TROPONINIHS 61* 62*      BNP Recent Labs  Lab 12/20/19 0903  BNP 2,920.0*     Radiology    US Abdomen Complete  Result Date: 12/23/2019 CLINICAL DATA:  RIGHT-sided abdominal pain for 1 day. EXAM: ABDOMEN ULTRASOUND COMPLETE COMPARISON:  CT 09/30/2017 FINDINGS: Gallbladder: Gallbladder wall is thickened, 3.5 millimeters. This finding is nonspecific. No stones or pericholecystic fluid. No sonographic Murphy's sign. Common bile duct: Diameter: 0.5 millimeters Liver: Mildly echogenic liver parenchyma without other features of hepatic steatosis. Rounded liver  contour raising the question of cirrhosis. No focal liver lesions. Portal vein is patent on color Doppler imaging with normal direction of blood flow towards the liver. IVC: No abnormality visualized. Pancreas: Limited visibility because of overlying bowel gas. Spleen: Size and appearance within normal limits. Right Kidney: Length: 12.0 centimeters. Mildly increased echogenicity. LOWER pole cyst is 1.4 x 1.2 x 1.5 centimeters. No hydronephrosis. Left Kidney: Length: 12.9 centimeters. Mildly increased echogenicity. The central calcifications are favored to be vascular. No solid or cystic mass. Abdominal aorta: No aneurysm visualized. Other findings: Study quality is degraded by patient body habitus and central bowel gas. IMPRESSION: 1. Nonspecific gallbladder wall thickening without other evidence for acute cholecystitis. 2. Rounded liver contour suspicious for cirrhosis. 3. RIGHT renal cyst. 4. No hydronephrosis. Electronically Signed   By: Nolon Nations M.D.   On: 12/23/2019 09:20   DG Chest Portable 1 View  Result Date: 12/20/2019 CLINICAL DATA:  Shortness of breath EXAM: PORTABLE CHEST 1 VIEW COMPARISON:  November 22, 2019. FINDINGS: There is cardiomegaly with mild pulmonary venous hypertension. There is atelectatic change in the bases and left mid lung. There is bilateral lower lung interstitial edema. No airspace consolidation. No adenopathy. No bone lesions. IMPRESSION: Cardiomegaly with pulmonary vascular congestion. Lower lung interstitial edema. Suspect a degree of congestive heart failure. Areas of atelectatic change without consolidation noted. No adenopathy appreciable. Electronically Signed   By: Lowella Grip III M.D.   On: 12/20/2019 09:32    Telemetry    Sinus rhythm, occas pvcs/couplets - Personally Reviewed  Cardiac Studies   echo 11/23/2019: 1. Left ventricular ejection fraction, by estimation, is 25 to 30%. The  left ventricle has severely decreased function. The left ventricle    demonstrates global hypokinesis. The left ventricular internal cavity size  was mildly dilated. There is mild left  ventricular hypertrophy. Left ventricular diastolic parameters are  consistent with Grade III diastolic dysfunction (restrictive). Elevated  left atrial pressure.  2. Right ventricular systolic function is moderately reduced. The right  ventricular size is mildly enlarged. There is moderately elevated  pulmonary artery systolic pressure.  3. Left atrial size was mild to moderately dilated.  4. Right atrial size was mildly dilated.  5. The mitral valve is normal in structure. Trivial mitral valve  regurgitation. No evidence of mitral stenosis.  6. The aortic valve is tricuspid. Aortic valve regurgitation is mild to  moderate. Mild aortic valve sclerosis is present, with no evidence of  aortic valve stenosis.  7. Aortic dilatation noted. There is borderline dilatation of the aortic  root measuring 38 mm.  8. The inferior vena cava is dilated in size with <50% respiratory  variability, suggesting right atrial pressure of 15 mmHg.  Patient Profile     49 y.o. 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  Assessment & Plan    1.  Acute on chronic HFrEF/NICM:  EF 25-30% by echo 11/2019.  Admitted 5/3 w/ marked volume overload and 22 lbs wt gain.  He says wt gain was very abrupt, over just a few days.  He has been diuresing well and is minus 3.4L overnight and 13.5L for admission.  Wt down from 112.9kg on admission to 107.7kg this AM.  Wt previously 105.4kg @ 4/14 CHF clinic visit and he confirms that that is an accurate dry wt for him. Creat relatively stable while BUN is bumping slightly.  Bicarb stable @ 26.  Cont IV lasix today.  Can plan to transition to oral torsemide tomorrow - was on 40 BID previously.  Cont   blocker, entresto, spiro.  Of note, he was prev eval by Dr. Caryl Comes related to intermittent QT prolongation and in the setting of ongoing cardiomyopathy despite GDMT, ICD implant was recommended - he deferred @ that time related to insurance (early 2020).  In light of worsening LV dysfxn, we will need to reconsider ICD implant.  Given narrow QRS, he may also be a candidate for enrollment in the BATwire trial (barostim) - if he is interested.  Regardless, he will benefit from outpt f/u in advanced CHF clinic in [place] going forward.  2.  CKD II-III:  Creat relatively stable.  3.  COPD:  Nebs/steroids per IM.  Signed, Murray Hodgkins, NP  12/23/2019, 10:37 AM    For questions or updates, please contact   Please consult [email or web address] for contact info under Cardiology/STEMI.

## 2019-12-23 NOTE — Discharge Summary (Signed)
Physician Discharge Summary  Gabriel [name]. ST:[id number] DOB: 1971/02/11 DOA: 12/20/2019  PCP: Alisa Graff, FNP  Admit date: 12/20/2019 Discharge date: 12/23/2019  Admitted From: Home Disposition: Home   Recommendations for Outpatient Follow-up:  1. Follow up with PCP in 1-2 weeks 2. Follow-up in heart failure clinic. 3. Please obtain BMP/CBC in one week 4. Please follow up on the following pending results:None  Home Health: No Equipment/Devices: None Discharge Condition: Stable CODE STATUS: Full Diet recommendation: Heart Healthy / Carb Modified   Brief/Interim Summary: Gabriel Fordis a 49 y.o.malewith medical history significantfor nonischemic cardiomyopathy, nicotine dependence,chronic systolic heart failure (LVEF 30 - 35%),HTN, DM2,chronic chest wall pain, nonobstructive coronary artery disease, CKD2, noncompliance with medication who presentsto the emergency room for evaluation of shortness of breath that has progressively worsened over the last 3 daysassociated with orthopnea and paroxysmal nocturnal dyspnea. Patient states that he has gained 20pounds in the last 4 days??   Admitted for acute on chronic HFrEF.  Patient was admitted with weight of 248 pounds with baseline around 2 35-2 40.  His discharge weight was 237 pound.  He was initially treated with IV Lasix which was discontinue when creatinine started increasing.  Patient appears euvolemic clinically on discharge and will continue home dose of torsemide.  Cardiology was following and they were recommending close follow-up in heart failure clinic and a possible ICD placement because of low EF. He will continue with home dose of Entresto and spironolactone.  Carvedilol was started instead of metoprolol.  Patient did developed right upper quadrant pain radiating to right side of chest and lower right abdomen.  He was given a short course of prednisone for concern of costochondritis and abdominal  ultrasound with some gallbladder thickening with no stones or sign of cholecystitis.  There was some concern of liver cirrhosis and he will need to follow-up closely with his PCP for further evaluation and management.  Patient has an history of CKD stage III which remained stable.  Patient has uncontrolled diabetes with A1c of 8.7.  Optimum controls of diabetes is important specially with his HFrEF and CKD.  He will continue his home meds and follow-up with his PCP closely for better control of blood glucose.  His blood pressure remained within goal and he will continue his home meds.  Discharge Diagnoses:  Principal Problem:   Acute on chronic combined systolic and diastolic CHF (congestive heart failure) (HCC) Active Problems:   Essential hypertension   COPD (chronic obstructive pulmonary disease) (HCC)   NICM (nonischemic cardiomyopathy) (HCC)   Acute on chronic systolic CHF (congestive heart failure) (HCC)   Tobacco use   Noncompliance with medications   CKD stage 3 due to type 2 diabetes mellitus (HCC)   Acute on chronic combined systolic (congestive) and diastolic (congestive) heart failure (HCC)   Costochondritis   Acute respiratory failure with hypoxia [facility])  Discharge Instructions  Discharge Instructions    (HEART FAILURE PATIENTS) Call MD:  Anytime you have any of the following symptoms: 1) 3 pound weight gain in 24 hours or 5 pounds in 1 week 2) shortness of breath, with or without a dry hacking cough 3) swelling in the hands, feet or stomach 4) if you have to sleep on extra pillows at night in order to breathe.   Complete by: As directed    AMB referral to CHF clinic   Complete by: As directed    Amb Referral to Cardiac Rehabilitation   Complete by: As directed  Diagnosis: Heart Failure (see criteria below if ordering Phase II)   Heart Failure Type: Chronic Systolic & Diastolic   After initial evaluation and assessments completed: Virtual Based Care may be provided  alone or in conjunction with Phase 2 Cardiac Rehab based on patient barriers.: Yes   Diet - low sodium heart healthy   Complete by: As directed    Discharge instructions   Complete by: As directed    It was pleasure taking care of you. You can resume your torsemide from tomorrow.  And follow-up with your cardiologist in heart failure clinic. Your metoprolol was switched with carvedilol.  Stop taking metoprolol and start taking carvedilol as directed. Your ultrasound was without any gallstones but did show some concern about liver cirrhosis, please follow-up with your primary care physician closely.   Increase activity slowly   Complete by: As directed      Allergies as of 12/23/2019      Reactions   Metformin And Related Nausea And Vomiting   Prednisone Other (See Comments)   Reaction: Hallucinations    Zantac [ranitidine Hcl] Diarrhea, Nausea Only   Night sweats      Medication List    STOP taking these medications   metoprolol 200 MG 24 hr tablet Commonly known as: Toprol XL     TAKE these medications   aspirin 81 MG chewable tablet Chew 1 tablet (81 mg total) by mouth daily. Reported on 02/08/2016   benzonatate 200 MG capsule Commonly known as: TESSALON Take 1 capsule (200 mg total) by mouth 3 (three) times daily.   carvedilol 25 MG tablet Commonly known as: COREG Take 1 tablet (25 mg total) by mouth 2 (two) times daily with a meal.   cloNIDine 0.1 MG tablet Commonly known as: Catapres Take 1 tablet (0.1 mg total) by mouth 2 (two) times daily.   colchicine 0.6 MG tablet Take 1 tablet (0.6 mg total) by mouth daily. Take 1 tab daily for at least 6 more days. If  Symptoms persist past 6 days continue to use until prescription is finished   Entresto 97-103 MG Generic drug: sacubitril-valsartan Take 1 tablet by mouth 2 (two) times daily.   insulin detemir 100 UNIT/ML injection Commonly known as: Levemir Inject 0.24 mLs (24 Units total) into the skin at bedtime.    nitroGLYCERIN 0.4 MG SL tablet Commonly known as: NITROSTAT Place 1 tablet (0.4 mg total) under the tongue every 5 (five) minutes x 3 doses as needed for chest pain.   rosuvastatin 20 MG tablet Commonly known as: CRESTOR Take 1 tablet (20 mg total) by mouth daily. What changed: when to take this   spironolactone 25 MG tablet Commonly known as: ALDACTONE Take 1 tablet (25 mg total) by mouth 2 (two) times daily.   torsemide 20 MG tablet Commonly known as: DEMADEX Take 2 tablets (40 mg total) by mouth 2 (two) times daily.      Follow-up Information    Alisa Graff, FNP. Schedule an appointment as soon as possible for a visit.   Specialty: Family Medicine Contact information: [place] [id number] [phone]        Minna Merritts, MD .   Specialty: Cardiology Contact information: [address] [place] [address] [phone]          Allergies  Allergen Reactions  . Metformin And Related Nausea And Vomiting  . Prednisone Other (See Comments)    Reaction: Hallucinations    .  Zantac [Ranitidine Hcl] Diarrhea and Nausea Only    Night sweats    Consultations:  Cardiology  Procedures/Studies: US Abdomen Complete  Result Date: 12/23/2019 CLINICAL DATA:  RIGHT-sided abdominal pain for 1 day. EXAM: ABDOMEN ULTRASOUND COMPLETE COMPARISON:  CT 09/30/2017 FINDINGS: Gallbladder: Gallbladder wall is thickened, 3.5 millimeters. This finding is nonspecific. No stones or pericholecystic fluid. No sonographic Murphy's sign. Common bile duct: Diameter: 0.5 millimeters Liver: Mildly echogenic liver parenchyma without other features of hepatic steatosis. Rounded liver contour raising the question of cirrhosis. No focal liver lesions. Portal vein is patent on color Doppler imaging with normal direction of blood flow towards the liver. IVC: No abnormality visualized. Pancreas: Limited visibility because of overlying bowel  gas. Spleen: Size and appearance within normal limits. Right Kidney: Length: 12.0 centimeters. Mildly increased echogenicity. LOWER pole cyst is 1.4 x 1.2 x 1.5 centimeters. No hydronephrosis. Left Kidney: Length: 12.9 centimeters. Mildly increased echogenicity. The central calcifications are favored to be vascular. No solid or cystic mass. Abdominal aorta: No aneurysm visualized. Other findings: Study quality is degraded by patient body habitus and central bowel gas. IMPRESSION: 1. Nonspecific gallbladder wall thickening without other evidence for acute cholecystitis. 2. Rounded liver contour suspicious for cirrhosis. 3. RIGHT renal cyst. 4. No hydronephrosis. Electronically Signed   By: Nolon Nations M.D.   On: 12/23/2019 09:20   US Venous Img Lower Unilateral Right  Result Date: 12/11/2019 CLINICAL DATA:  Right knee and thigh pain, swelling EXAM: RIGHT LOWER EXTREMITY VENOUS DOPPLER ULTRASOUND TECHNIQUE: Gray-scale sonography with compression, as well as color and duplex ultrasound, were performed to evaluate the deep venous system(s) from the level of the common femoral vein through the popliteal and proximal calf veins. COMPARISON:  None. FINDINGS: VENOUS Normal compressibility of the common femoral, superficial femoral, and popliteal veins, as well as the visualized calf veins. Visualized portions of profunda femoral vein and great saphenous vein unremarkable. No filling defects to suggest DVT on grayscale or color Doppler imaging. Doppler waveforms show normal direction of venous flow, normal respiratory phasicity and response to augmentation. Limited views of the contralateral common femoral vein are unremarkable. OTHER None. Limitations: Note is made of occlusive thrombus seen within the right greater saphenous vein in the mid to distal thigh. This does not extend into the deep venous system. IMPRESSION: 1. No evidence of deep venous thrombosis within the right lower extremity. 2. Superficial  thrombus within the mid to distal right greater saphenous vein. Electronically Signed   By: Randa Ngo M.D.   On: 12/11/2019 23:14   DG Chest Portable 1 View  Result Date: 12/20/2019 CLINICAL DATA:  Shortness of breath EXAM: PORTABLE CHEST 1 VIEW COMPARISON:  November 22, 2019. FINDINGS: There is cardiomegaly with mild pulmonary venous hypertension. There is atelectatic change in the bases and left mid lung. There is bilateral lower lung interstitial edema. No airspace consolidation. No adenopathy. No bone lesions. IMPRESSION: Cardiomegaly with pulmonary vascular congestion. Lower lung interstitial edema. Suspect a degree of congestive heart failure. Areas of atelectatic change without consolidation noted. No adenopathy appreciable. Electronically Signed   By: Lowella Grip III M.D.   On: 12/20/2019 09:32   DG Knee Complete 4 Views Right  Result Date: 12/11/2019 CLINICAL DATA:  49 year old male with right knee pain. No known injury. EXAM: RIGHT KNEE - COMPLETE 4+ VIEW COMPARISON:  None. FINDINGS: There is no acute fracture or dislocation. Irregular appearance of the superolateral patella, chronic. The bones are well mineralized. No arthritic changes. No joint effusion.  The soft tissues are unremarkable. IMPRESSION: No acute fracture or dislocation. Electronically Signed   By: Anner Crete M.D.   On: 12/11/2019 21:56     Subjective: Patient was feeling better when seen today.  His right upper quadrant pain has been improved.  No new complaints.  Discharge Exam: Vitals:   12/23/19 0349 12/23/19 0710  BP: 112/71 119/88  Pulse: 76 79  Resp: 19 19  Temp: 98.4 F (36.9 C) 98.2 F (36.8 C)  SpO2: 94% 97%   Vitals:   12/22/19 1544 12/22/19 1918 12/23/19 0349 12/23/19 0710  BP: 124/86 121/77 112/71 119/88  Pulse: 79 84 76 79  Resp: 19 19 19 19   Temp: 98.3 F (36.8 C) 99.2 F (37.3 C) 98.4 F (36.9 C) 98.2 F (36.8 C)  TempSrc: Oral Oral Oral Oral  SpO2: 97% 95% 94% 97%  Weight:    107.7 kg   Height:        General: Pt is alert, awake, not in acute distress Cardiovascular: RRR, S1/S2 +, no rubs, no gallops Respiratory: CTA bilaterally, no wheezing, no rhonchi Abdominal: Soft, NT, ND, bowel sounds + Extremities: no edema, no cyanosis   The results of significant diagnostics from this hospitalization (including imaging, microbiology, ancillary and laboratory) are listed below for reference.    Microbiology: Recent Results (from the past 240 hour(s))  Respiratory Panel by RT PCR (Flu A&B, Covid) - Nasopharyngeal Swab     Status: None   Collection Time: 12/20/19 10:06 AM   Specimen: Nasopharyngeal Swab  Result Value Ref Range Status   SARS Coronavirus 2 by RT PCR NEGATIVE NEGATIVE Final    Comment: (NOTE) SARS-CoV-2 target nucleic acids are NOT DETECTED. The SARS-CoV-2 RNA is generally detectable in upper respiratoy specimens during the acute phase of infection. The lowest concentration of SARS-CoV-2 viral copies this assay can detect is 131 copies/mL. A negative result does not preclude SARS-Cov-2 infection and should not be used as the sole basis for treatment or other patient management decisions. A negative result may occur with  improper specimen collection/handling, submission of specimen other than nasopharyngeal swab, presence of viral mutation(s) within the areas targeted by this assay, and inadequate number of viral copies (<131 copies/mL). A negative result must be combined with clinical observations, patient history, and epidemiological information. The expected result is Negative. Fact Sheet for Patients:  PinkCheek.be Fact Sheet for Healthcare Providers:  GravelBags.it This test is not yet ap proved or cleared by the [place] FDA and  has been authorized for detection and/or diagnosis of SARS-CoV-2 by FDA under an Emergency Use Authorization (EUA). This EUA will remain  in effect  (meaning this test can be used) for the duration of the COVID-19 declaration under Section 564(b)(1) of the Act, 21 U.S.C. section 360bbb-3(b)(1), unless the authorization is terminated or revoked sooner.    Influenza A by PCR NEGATIVE NEGATIVE Final   Influenza B by PCR NEGATIVE NEGATIVE Final    Comment: (NOTE) The Xpert Xpress SARS-CoV-2/FLU/RSV assay is intended as an aid in  the diagnosis of influenza from Nasopharyngeal swab specimens and  should not be used as a sole basis for treatment. Nasal washings and  aspirates are unacceptable for Xpert Xpress SARS-CoV-2/FLU/RSV  testing. Fact Sheet for Patients: PinkCheek.be Fact Sheet for Healthcare Providers: GravelBags.it This test is not yet approved or cleared by the [place] FDA and  has been authorized for detection and/or diagnosis of SARS-CoV-2 by  FDA under an Emergency Use Authorization (EUA). This EUA  will remain  in effect (meaning this test can be used) for the duration of the  Covid-19 declaration under Section 564(b)(1) of the Act, 21  U.S.C. section 360bbb-3(b)(1), unless the authorization is  terminated or revoked. Performed at [facility], [address] [place], [place] [address]      Labs: BNP (last 3 results) Recent Labs    09/22/19 0516 11/22/19 0343 12/20/19 0903  BNP 1,178.0* 1,698.0* [id number]*   Basic Metabolic Panel: Recent Labs  Lab 12/20/19 0903 12/21/19 0412 12/21/19 2143 12/22/19 0625 12/23/19 0532  NA 137 136  --  133* 130*  K 4.1 3.7  --  4.0 4.2  CL 105 98  --  97* 94*  CO2 22 28  --  26 26  GLUCOSE 209* 150* 373* 177* 226*  BUN 23* 29*  --  30* 36*  CREATININE 1.24 1.40*  --  1.13 1.27*  CALCIUM 8.7* 8.5*  --  8.5* 8.5*   Liver Function Tests: Recent Labs  Lab 12/20/19 0903  AST 20  ALT 20  ALKPHOS 56  BILITOT 3.4*  PROT 6.6  ALBUMIN 3.4*   No results for input(s): LIPASE, AMYLASE in the last 168  hours. No results for input(s): AMMONIA in the last 168 hours. CBC: Recent Labs  Lab 12/20/19 0903  WBC 11.9*  NEUTROABS 8.5*  HGB 16.7  HCT 50.4  MCV 83.3  PLT 165   Cardiac Enzymes: No results for input(s): CKTOTAL, CKMB, CKMBINDEX, TROPONINI in the last 168 hours. BNP: Invalid input(s): POCBNP CBG: Recent Labs  Lab 12/22/19 0723 12/22/19 1200 12/22/19 1639 12/22/19 2108 12/23/19 0712  GLUCAP 167* 169* 306* 292* 203*   D-Dimer No results for input(s): DDIMER in the last 72 hours. Hgb A1c No results for input(s): HGBA1C in the last 72 hours. Lipid Profile No results for input(s): CHOL, HDL, LDLCALC, TRIG, CHOLHDL, LDLDIRECT in the last 72 hours. Thyroid function studies No results for input(s): TSH, T4TOTAL, T3FREE, THYROIDAB in the last 72 hours.  Invalid input(s): FREET3 Anemia work up No results for input(s): VITAMINB12, FOLATE, FERRITIN, TIBC, IRON, RETICCTPCT in the last 72 hours. Urinalysis    Component Value Date/Time   COLORURINE STRAW (A) 01/09/2018 1659   APPEARANCEUR Clear 07/29/2018 1200   LABSPEC 1.026 01/09/2018 1659   LABSPEC 1.019 11/12/2012 1713   PHURINE 6.0 01/09/2018 1659   GLUCOSEU Negative 07/29/2018 1200   GLUCOSEU Negative 11/12/2012 1713   HGBUR SMALL (A) 01/09/2018 1659   BILIRUBINUR Negative 07/29/2018 1200   BILIRUBINUR Negative 11/12/2012 1713   KETONESUR 5 (A) 01/09/2018 1659   PROTEINUR 2+ (A) 07/29/2018 1200   PROTEINUR 100 (A) 01/09/2018 1659   NITRITE Negative 07/29/2018 1200   NITRITE NEGATIVE 01/09/2018 1659   LEUKOCYTESUR Negative 07/29/2018 1200   LEUKOCYTESUR Negative 11/12/2012 1713   Sepsis Labs Invalid input(s): PROCALCITONIN,  WBC,  LACTICIDVEN Microbiology Recent Results (from the past 240 hour(s))  Respiratory Panel by RT PCR (Flu A&B, Covid) - Nasopharyngeal Swab     Status: None   Collection Time: 12/20/19 10:06 AM   Specimen: Nasopharyngeal Swab  Result Value Ref Range Status   SARS Coronavirus 2 by  RT PCR NEGATIVE NEGATIVE Final    Comment: (NOTE) SARS-CoV-2 target nucleic acids are NOT DETECTED. The SARS-CoV-2 RNA is generally detectable in upper respiratoy specimens during the acute phase of infection. The lowest concentration of SARS-CoV-2 viral copies this assay can detect is 131 copies/mL. A negative result does not preclude SARS-Cov-2 infection and should not  be used as the sole basis for treatment or other patient management decisions. A negative result may occur with  improper specimen collection/handling, submission of specimen other than nasopharyngeal swab, presence of viral mutation(s) within the areas targeted by this assay, and inadequate number of viral copies (<131 copies/mL). A negative result must be combined with clinical observations, patient history, and epidemiological information. The expected result is Negative. Fact Sheet for Patients:  PinkCheek.be Fact Sheet for Healthcare Providers:  GravelBags.it This test is not yet ap proved or cleared by the [place] FDA and  has been authorized for detection and/or diagnosis of SARS-CoV-2 by FDA under an Emergency Use Authorization (EUA). This EUA will remain  in effect (meaning this test can be used) for the duration of the COVID-19 declaration under Section 564(b)(1) of the Act, 21 U.S.C. section 360bbb-3(b)(1), unless the authorization is terminated or revoked sooner.    Influenza A by PCR NEGATIVE NEGATIVE Final   Influenza B by PCR NEGATIVE NEGATIVE Final    Comment: (NOTE) The Xpert Xpress SARS-CoV-2/FLU/RSV assay is intended as an aid in  the diagnosis of influenza from Nasopharyngeal swab specimens and  should not be used as a sole basis for treatment. Nasal washings and  aspirates are unacceptable for Xpert Xpress SARS-CoV-2/FLU/RSV  testing. Fact Sheet for Patients: PinkCheek.be Fact Sheet for Healthcare  Providers: GravelBags.it This test is not yet approved or cleared by the [place] FDA and  has been authorized for detection and/or diagnosis of SARS-CoV-2 by  FDA under an Emergency Use Authorization (EUA). This EUA will remain  in effect (meaning this test can be used) for the duration of the  Covid-19 declaration under Section 564(b)(1) of the Act, 21  U.S.C. section 360bbb-3(b)(1), unless the authorization is  terminated or revoked. Performed at [facility], [address] [place], [place] [address]     Time coordinating discharge: Over 30 minutes  SIGNED:  Lorella Nimrod, MD  [facility] 12/23/2019, 11:30 AM  If 7PM-7AM, please contact night-coverage [email or web address]  This record has been created using Systems analyst. Errors have been sought and corrected,but may not always be located. Such creation errors do not reflect on the standard of care.

## 2019-12-23 NOTE — Progress Notes (Signed)
Spoke to MD- ok to give diet order at this time.

## 2019-12-23 NOTE — Progress Notes (Signed)
Discussed discharge instruction with patient including medications and follow up appointments.   Asked patient to monitor BP since changing BP medication.  Gave patient letter for work.

## 2019-12-24 ENCOUNTER — Telehealth (HOSPITAL_COMMUNITY): Payer: Self-pay | Admitting: Vascular Surgery

## 2019-12-24 NOTE — Telephone Encounter (Signed)
Left pt VM, giving new pt appt w/ [facility] 01/18/20 , asked pt to call back to confirm appt

## 2019-12-27 ENCOUNTER — Other Ambulatory Visit: Payer: Self-pay

## 2019-12-27 ENCOUNTER — Ambulatory Visit (INDEPENDENT_AMBULATORY_CARE_PROVIDER_SITE_OTHER): Payer: Self-pay | Admitting: Cardiovascular Disease

## 2019-12-27 ENCOUNTER — Encounter: Payer: Self-pay | Admitting: Cardiovascular Disease

## 2019-12-27 VITALS — BP 106/72 | HR 67 | Ht 72.0 in | Wt 229.4 lb

## 2019-12-27 DIAGNOSIS — E119 Type 2 diabetes mellitus without complications: Secondary | ICD-10-CM

## 2019-12-27 DIAGNOSIS — I5022 Chronic systolic (congestive) heart failure: Secondary | ICD-10-CM

## 2019-12-27 DIAGNOSIS — I10 Essential (primary) hypertension: Secondary | ICD-10-CM

## 2019-12-27 DIAGNOSIS — Z720 Tobacco use: Secondary | ICD-10-CM

## 2019-12-27 DIAGNOSIS — N183 Chronic kidney disease, stage 3 unspecified: Secondary | ICD-10-CM

## 2019-12-27 DIAGNOSIS — Z794 Long term (current) use of insulin: Secondary | ICD-10-CM

## 2019-12-27 DIAGNOSIS — R9431 Abnormal electrocardiogram [ECG] [EKG]: Secondary | ICD-10-CM

## 2019-12-27 DIAGNOSIS — I428 Other cardiomyopathies: Secondary | ICD-10-CM

## 2019-12-27 NOTE — Telephone Encounter (Signed)
Pt has an appt with Dr. Aundra Dubin at 6/1 @ 10:40 A.   Encounter completed at this time.

## 2019-12-27 NOTE — Patient Instructions (Addendum)
Medication Instructions:  Please cut the clonidine in 1/2 twice a day Monitor blood pressure  If you need a refill on your cardiac medications before your next appointment, please call your pharmacy.    Lab work: CMP today   If you have labs (blood work) drawn today and your tests are completely normal, you will receive your results only by: Marland Kitchen MyChart Message (if you have MyChart) OR . A paper copy in the mail If you have any lab test that is abnormal or we need to change your treatment, we will call you to review the results.   Testing/Procedures: No new testing needed   Follow-Up: At [facility], you and your health needs are our priority.  As part of our continuing mission to provide you with exceptional heart care, we have created designated Provider Care Teams.  These Care Teams include your primary Cardiologist (physician) and Advanced Practice Providers (APPs -  Physician Assistants and Nurse Practitioners) who all work together to provide you with the care you need, when you need it.  . You will need a follow up appointment in 3 months  . Providers on your designated Care Team:   . Murray Hodgkins, NP . Christell Faith, PA-C . Marrianne Mood, PA-C  Any Other Special Instructions Will Be Listed Below (If Applicable).  For educational health videos Log in to : [email or web address] Or : [email or web address], password : [email or web address]

## 2019-12-27 NOTE — Progress Notes (Signed)
Patient ID: Gabriel Bohner., male   DOB: [birth year], 49 y.o.   MRN: [id number] Cardiology Office Note  Date:  12/27/2019   ID:  Gabriel Righter., DOB [birth year], MRN [id number]  PCP:  Alisa Graff, FNP   Chief Complaint  Patient presents with  . Other    Follow up [facility]; CHF. Meds reviewed by the pt. verbally. Pt. c/o cough, sore throat since the hospital stay, weakness and fatigue and has shortness of breath with little to no exertion.     HPI:  49 y.o. male with h/o  nonischemic cardiomyopathy by nuclear stress test in 2016, OSA, did not tolerate CPAP History of medication noncompliance History of chronic chest pain [facility] on 01/03/16 with cough and SOB, septic shock, right upper lobe pneumonia,  Left heart cath 01/06/2017 Nonobstructive coronary disease, 40% in RCA  left ventricular ejection fraction  25-35% by visual estimate. chronic systolic CHF, Follow-up echocardiograms ejection fraction 30-35%, up to 45% - 50% with medical management COPD, long smoking history Prior runs of nonsustained VT, syncope DM2, med noncompliance, Morbid obesity He presents today for follow-up of his nonischemic cardiomyopathy, chronic systolic CHF  Recent hospitalization for CHF Aggressive diuresis, discharge Dec 23, 2019 Over 13 L negative at admission Weight down to 107.7 kg Prior weight 105 kg December 01 2019 We will transition back to torsemide 40 twice daily at discharge  echo 11/23/2019: 1. Left ventricular ejection fraction, by estimation, is 25 to 30%. The  left ventricle has severely decreased function. The left ventricle  demonstrates global hypokinesis. The left ventricular internal cavity size  was mildly dilated. There is mild left  ventricular hypertrophy. Left ventricular diastolic parameters are  consistent with Grade III diastolic dysfunction (restrictive). Elevated  left atrial pressure.   Recent lab work sodium 130, glucose 220s, creatinine 1.27 BUN 36  Weight today  104 kg, likely around his dry weight Home weight 225 (here 229) Lost weight over the past 2 years  On discussion today relying on samples of Entresto He did not sign up for insurance through work, [employer], " missed the date he was in the hospital".  Unable to sign up for insurance until November this year  --We called medical management across the street that can help with medications, he was given paperwork in January did not return it  Works at [employer], [employer] think I can work" On prior office visits was looking into disability, judge told him that he did not qualify  HBA1C 8.7 Not on insurance, not on insulin Looking into medicaid  EKG personally reviewed by myself on todays visit Shows normal sinus rhythm rate 67 bpm nonspecific ST abnormality T wave inversion V6 1 aVL  Other past medical history reviewed Seen in the emergency room 11/16/2017 Chronic chest pain shortness of breath Low back pain pain radiating down left leg Normal renal function Emergency room told him that they do not do outpatient workup and do not provide pain medication   hospitalization for shortness of breath Left heart cath 01/06/2017 Nonobstructive coronary disease, 40% in RCA  left ventricular ejection fraction  25-35% by visual estimate.  In hospital 04/2017 orthostatic lightheadedness in the setting of acute kidney injury. Creatinine 2.6 intravascular volume depletion Meds: Entresto,  torsemide to 40 mg daily,  metoprolol succinate to 50 mg daily. Spironolactone held  LVEF, 45-50%.  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 Now reports he is going to [facility]  Reports having sleep study in the past, was positive, did not tolerate CPAP.  Does not want to wear that, feels he sleeps fine    PMH:   has a past medical history of Chest wall pain, chronic, Chronic systolic CHF (congestive heart failure) (HCC), Chronic Troponin Elevation, CKD (chronic kidney disease), stage III, COPD (chronic obstructive pulmonary disease) ([place]), Diabetes mellitus without complication ([place]), Hypertension, NICM (nonischemic cardiomyopathy) ([place]), Non-obstructive CAD (coronary artery disease), NSVT (nonsustained ventricular tachycardia) ([place]), Obesity (BMI 30.0-34.9), Psoriasis, Renal insufficiency, Syncope, and Tobacco abuse.  PSH:    Past Surgical History:  Procedure Laterality Date  . AMPUTATION    . CARDIAC CATHETERIZATION    . FINGER AMPUTATION     Traumatic  . FINGER FRACTURE SURGERY Left   . LEFT HEART CATH AND CORONARY ANGIOGRAPHY N/A 01/06/2017   Procedure: Left Heart Cath and Coronary Angiography;  Surgeon: Wellington Hampshire, MD;  Location: [facility];  Service: Cardiovascular;  Laterality: N/A;    Current Outpatient Medications  Medication Sig Dispense Refill  . aspirin 81 MG chewable tablet Chew 1 tablet (81 mg total) by mouth daily. Reported on 02/08/2016 90 tablet 3  . benzonatate (TESSALON) 200 MG capsule Take 1 capsule (200 mg total) by mouth 3 (three) times daily. 20 capsule 0  . carvedilol (COREG) 25 MG tablet Take 1 tablet (25 mg total) by mouth 2 (two) times daily with a meal. 60 tablet 0  . cloNIDine (CATAPRES) 0.1 MG tablet Take 1 tablet (0.1 mg total) by mouth 2 (two) times daily. 180 tablet 3  . colchicine 0.6 MG tablet Take 1 tablet (0.6 mg total) by mouth daily. Take 1 tab daily for at least 6 more days. If  Symptoms persist past 6  days continue to use until prescription is finished 20 tablet 0  . nitroGLYCERIN (NITROSTAT) 0.4 MG SL tablet Place 1 tablet (0.4 mg total) under the tongue every 5 (five) minutes x 3 doses as needed for chest pain. 30 tablet 0  . rosuvastatin (CRESTOR) 20 MG tablet Take 1 tablet (20 mg total) by mouth daily. (Patient taking differently: Take 20 mg by mouth at bedtime. ) 90 tablet 3  . sacubitril-valsartan (ENTRESTO) 97-103 MG Take 1 tablet by mouth 2 (two) times daily.    Marland Kitchen spironolactone (ALDACTONE) 25 MG tablet Take 1 tablet (25 mg total) by mouth 2 (two) times daily. 180 tablet 3  . torsemide (DEMADEX) 20 MG tablet Take 2 tablets (40 mg total) by mouth 2 (two) times daily. 360 tablet 3  . insulin detemir (LEVEMIR) 100 UNIT/ML injection Inject 0.24 mLs (24 Units total) into the skin at bedtime. (Patient not taking: Reported on 12/20/2019) 7.2 mL 0   No current facility-administered medications for this visit.    Allergies:   Metformin and related, Prednisone, and Zantac [ranitidine hcl]   Social History:  The patient  reports that he has been smoking Cigarettes.  He has been smoking about 2.00 packs per day for  the past 33 years. He has never used smokeless tobacco. He reports that he drinks about 1.8 oz of alcohol per week. He reports that he does not use illicit drugs.   Family History:   family history includes Cancer in his maternal grandfather and paternal aunt; Diabetes in his maternal grandfather; Diabetes Mellitus II in his mother; Hypertension in his father.    Review of Systems: Review of Systems  Constitutional: Negative.   Respiratory: Positive for shortness of breath.   Cardiovascular: Negative.   Gastrointestinal: Negative.   Musculoskeletal: Positive for back pain.  Psychiatric/Behavioral: Negative.   All other systems reviewed and are negative.    PHYSICAL EXAM: VS:  BP 106/72 (BP Location: Left Arm, Patient Position: Sitting, Cuff Size: Normal)   Pulse 67   Ht 6'  (1.829 m)   Wt 229 lb 6 oz (104 kg)   SpO2 95%   BMI 31.11 kg/m  , BMI Body mass index is 31.11 kg/m. Constitutional:  oriented to person, place, and time. No distress.  HENT:  Head: Grossly normal Eyes:  no discharge. No scleral icterus.  Neck: No JVD, no carotid bruits  Cardiovascular: Regular rate and rhythm, no murmurs appreciated Pulmonary/Chest: Clear to auscultation bilaterally, no wheezes or rails Abdominal: Soft.  no distension.  no tenderness.  Musculoskeletal: Normal range of motion Neurological:  normal muscle tone. Coordination normal. No atrophy Skin: Skin warm and dry Psychiatric: normal affect, pleasant   Recent Labs: 11/23/2019: Magnesium 1.8 12/20/2019: ALT 20; B Natriuretic Peptide 2,920.0; Hemoglobin 16.7; Platelets 165 12/23/2019: BUN 36; Creatinine, Ser 1.27; Potassium 4.2; Sodium 130    Lipid Panel Lab Results  Component Value Date   CHOL 156 11/23/2019   HDL 21 (L) 11/23/2019   LDLCALC 96 11/23/2019   TRIG 196 (H) 11/23/2019      Wt Readings from Last 3 Encounters:  12/27/19 229 lb 6 oz (104 kg)  12/23/19 237 lb 8 oz (107.7 kg)  12/20/19 254 lb 8 oz (115.4 kg)      ASSESSMENT AND PLAN:  Syncope, unspecified syncope type No further episodes of syncope  No further work-up at this time  Cardiomyopathy ([place]) - Dilated, nonischemic Long history of medication noncompliance, Not helping himself get plugged in with insurance or medical management He has received paperwork in the past but did not fill it out Again requesting Entresto samples today We have called medical management, he did receive paperwork in January but did not fill it out and return it On a prior office visit reported he did not want to put his father's information on there  Essential hypertension Blood pressure is well controlled on today's visit. No changes made to the medications. Talked about good [email or web address]  Chronic systolic heart failure (HCC) Currently on torsemide 40 twice  daily, weight down, he has moderated his fluid intake, likely of chronic weight CMP today Recommend he continue to monitor blood pressure and weight at home  coronary artery disease without angina Previous cardiac catheterization with nonobstructive disease,  unrelated to his cardiomyopathy Smoking cessation recommended  Hospital records reviewed,  phone calls made on his behalf medical management, looking for [facility]  Total encounter time more than 45 minutes  Greater than 50% was spent in counseling and coordination of care with the patient    Orders Placed This Encounter  Procedures  . EKG 12-Lead     Signed, Esmond Plants, M.D., Ph.D. 12/27/2019  [place], [place]

## 2019-12-28 ENCOUNTER — Telehealth: Payer: Self-pay | Admitting: *Deleted

## 2019-12-28 LAB — COMPREHENSIVE METABOLIC PANEL
ALT: 59 IU/L — ABNORMAL HIGH (ref 0–44)
AST: 22 IU/L (ref 0–40)
Albumin/Globulin Ratio: 1 — ABNORMAL LOW (ref 1.2–2.2)
Albumin: 3.3 g/dL — ABNORMAL LOW (ref 4.0–5.0)
Alkaline Phosphatase: 89 IU/L (ref 39–117)
BUN/Creatinine Ratio: 24 — ABNORMAL HIGH (ref 9–20)
BUN: 31 mg/dL — ABNORMAL HIGH (ref 6–24)
Bilirubin Total: 1 mg/dL (ref 0.0–1.2)
CO2: 25 mmol/L (ref 20–29)
Calcium: 9.2 mg/dL (ref 8.7–10.2)
Chloride: 94 mmol/L — ABNORMAL LOW (ref 96–106)
Creatinine, Ser: 1.31 mg/dL — ABNORMAL HIGH (ref 0.76–1.27)
GFR calc Af Amer: 73 mL/min/{1.73_m2} (ref >59)
GFR calc non Af Amer: 63 mL/min/{1.73_m2} (ref >59)
Globulin, Total: 3.3 g/dL (ref 1.5–4.5)
Glucose: 339 mg/dL — ABNORMAL HIGH (ref 65–99)
Potassium: 4.9 mmol/L (ref 3.5–5.2)
Sodium: 136 mmol/L (ref 134–144)
Total Protein: 6.6 g/dL (ref 6.0–8.5)

## 2019-12-28 NOTE — Telephone Encounter (Signed)
Left voicemail message for patient to call back for results and recommendations.  °

## 2019-12-28 NOTE — Telephone Encounter (Signed)
Patient had visit with provider and was instructed to go by Medication Management in order to fill out forms for assistance. He agreed and provided him with pamphlet listing address and location. Notified them he should be coming by there shortly but he did not show. Notification received with updates and forwarded to provider so he is aware.

## 2019-12-28 NOTE — Telephone Encounter (Signed)
Spoke with patient and reviewed results and recommendations. He verbalized understanding of our conversation, agreement with plan, and had no further questions at this time.

## 2019-12-28 NOTE — Telephone Encounter (Signed)
-----   Message from Minna Merritts, MD sent at 12/28/2019  7:59 AM EDT ----- Renal function stable,  Would continue current dose of torsemide, keep weight the same as it was on his last clinic visit  229 pounds If it goes above that are way below that would let us know

## 2019-12-28 NOTE — Telephone Encounter (Signed)
-----   Message from Anastasio Champion sent at 12/27/2019  3:31 PM EDT ----- Regarding: Information on patient Soledad Gerlach,  Just letting you know that patient did NOT come by our office today--I did read the note from today's visit-- I saw that both [facility] were both listed in note. I called [facility] to see if they have been filling the [facility] for patient and according to their records patient was last seen there 2018 and they have not filled any meds since 2015. I also called [facility] and they have a script from 2018 and have not filled since then, also has not seen patient there since 2018.  Just touching base with you on this information.  Thanks, Elmer Picker

## 2019-12-29 MED ORDER — LOSARTAN POTASSIUM 100 MG PO TABS: 100.0000 mg | ORAL_TABLET | Freq: Every day | ORAL | 3 refills | Status: DC

## 2019-12-29 MED ORDER — LOSARTAN POTASSIUM 100 MG PO TABS
100.0000 mg | ORAL_TABLET | Freq: Every day | ORAL | 3 refills | Status: DC
Start: 2019-12-29 — End: 2019-12-29

## 2019-12-29 NOTE — Telephone Encounter (Signed)
I think we need to call him and explained the situation with the [facility] If he does not have a regular supply either through the company or what he is applying we need to change him to losartan He cannot go on and off depending on what samples he gets he needs to stay on something consistently We did talk about good Rx for his medications on last clinic visit

## 2019-12-29 NOTE — Telephone Encounter (Signed)
Checked GoodRx site and medication is $15.26 at [facility] and $61.00 at [facility]. Patient requested that I please resend to [facility]. Updated and he was appreciative for the call back.

## 2019-12-29 NOTE — Telephone Encounter (Signed)
Spoke with patient and reviewed providers concerns about not taking medications on a consistent basis. He reports that he has not made it by Medication management yet. Reviewed providers recommendations for losartan 100 mg once daily since he is not able to get his entresto. He was agreeable with this plan. I did review [email or web address] resources for the cost of this medication and that typically we find that it is cheaper at [facility] but he wanted it sent to [facility]. Advised that if it is more than $20.00 then he may want to have them send it over to [facility]. Also confirmed his upcoming appointment with Dr. Aundra Dubin. He verbalized understanding of our conversation, agreement with plan, and had no further questions at this time.

## 2019-12-29 NOTE — Telephone Encounter (Signed)
I wonder if we should change his Entresto to losartan as obviously he is not taking anything  He was going to stop up with Otila Kluver to get samples but she cannot keep providing them for him Difficult to say how much we should put him on but probably losartan 100 once a day and stop the [place]   Previously he did not want to put his father's information on the medical management paperwork He is now working but reports that he was in the hospital when they signed up for insurance and will have to wait until later this year for insurance sign up  Long history of medication noncompliance and not helping himself

## 2019-12-29 NOTE — Addendum Note (Signed)
Addended by: Valora Corporal on: 12/29/2019 01:23 PM   Modules accepted: Orders

## 2019-12-31 ENCOUNTER — Inpatient Hospital Stay: Payer: Medicaid Other

## 2019-12-31 ENCOUNTER — Other Ambulatory Visit: Payer: Self-pay

## 2019-12-31 ENCOUNTER — Inpatient Hospital Stay
Admission: EM | Admit: 2019-12-31 | Discharge: 2020-01-02 | DRG: 176 | Disposition: A | Discharge status: Home / Self Care | Payer: Medicaid Other | Attending: Internal Medicine | Admitting: Internal Medicine

## 2019-12-31 ENCOUNTER — Telehealth: Payer: Self-pay | Admitting: Family

## 2019-12-31 ENCOUNTER — Emergency Department: Payer: Medicaid Other

## 2019-12-31 DIAGNOSIS — Z79899 Other long term (current) drug therapy: Secondary | ICD-10-CM

## 2019-12-31 DIAGNOSIS — E669 Obesity, unspecified: Secondary | ICD-10-CM | POA: Diagnosis present

## 2019-12-31 DIAGNOSIS — I428 Other cardiomyopathies: Secondary | ICD-10-CM | POA: Diagnosis present

## 2019-12-31 DIAGNOSIS — L409 Psoriasis, unspecified: Secondary | ICD-10-CM | POA: Diagnosis present

## 2019-12-31 DIAGNOSIS — Z20822 Contact with and (suspected) exposure to covid-19: Secondary | ICD-10-CM | POA: Diagnosis present

## 2019-12-31 DIAGNOSIS — E785 Hyperlipidemia, unspecified: Secondary | ICD-10-CM | POA: Diagnosis present

## 2019-12-31 DIAGNOSIS — E1122 Type 2 diabetes mellitus with diabetic chronic kidney disease: Secondary | ICD-10-CM | POA: Diagnosis present

## 2019-12-31 DIAGNOSIS — Z8249 Family history of ischemic heart disease and other diseases of the circulatory system: Secondary | ICD-10-CM

## 2019-12-31 DIAGNOSIS — N182 Chronic kidney disease, stage 2 (mild): Secondary | ICD-10-CM | POA: Diagnosis present

## 2019-12-31 DIAGNOSIS — Z833 Family history of diabetes mellitus: Secondary | ICD-10-CM | POA: Diagnosis not present

## 2019-12-31 DIAGNOSIS — I130 Hypertensive heart and chronic kidney disease with heart failure and stage 1 through stage 4 chronic kidney disease, or unspecified chronic kidney disease: Secondary | ICD-10-CM | POA: Diagnosis present

## 2019-12-31 DIAGNOSIS — Z794 Long term (current) use of insulin: Secondary | ICD-10-CM | POA: Diagnosis not present

## 2019-12-31 DIAGNOSIS — I5022 Chronic systolic (congestive) heart failure: Secondary | ICD-10-CM | POA: Diagnosis present

## 2019-12-31 DIAGNOSIS — Z89029 Acquired absence of unspecified finger(s): Secondary | ICD-10-CM | POA: Diagnosis not present

## 2019-12-31 DIAGNOSIS — Z7982 Long term (current) use of aspirin: Secondary | ICD-10-CM

## 2019-12-31 DIAGNOSIS — J449 Chronic obstructive pulmonary disease, unspecified: Secondary | ICD-10-CM | POA: Diagnosis present

## 2019-12-31 DIAGNOSIS — Z87891 Personal history of nicotine dependence: Secondary | ICD-10-CM | POA: Diagnosis not present

## 2019-12-31 DIAGNOSIS — I10 Essential (primary) hypertension: Secondary | ICD-10-CM

## 2019-12-31 DIAGNOSIS — R042 Hemoptysis: Secondary | ICD-10-CM | POA: Diagnosis present

## 2019-12-31 DIAGNOSIS — I2699 Other pulmonary embolism without acute cor pulmonale: Secondary | ICD-10-CM | POA: Diagnosis not present

## 2019-12-31 DIAGNOSIS — M109 Gout, unspecified: Secondary | ICD-10-CM | POA: Diagnosis present

## 2019-12-31 DIAGNOSIS — E1169 Type 2 diabetes mellitus with other specified complication: Secondary | ICD-10-CM | POA: Diagnosis present

## 2019-12-31 DIAGNOSIS — Z6831 Body mass index (BMI) 31.0-31.9, adult: Secondary | ICD-10-CM | POA: Diagnosis not present

## 2019-12-31 DIAGNOSIS — Z888 Allergy status to other drugs, medicaments and biological substances status: Secondary | ICD-10-CM

## 2019-12-31 DIAGNOSIS — I2694 Multiple subsegmental pulmonary emboli without acute cor pulmonale: Secondary | ICD-10-CM | POA: Diagnosis not present

## 2019-12-31 DIAGNOSIS — Z86711 Personal history of pulmonary embolism: Secondary | ICD-10-CM

## 2019-12-31 LAB — BASIC METABOLIC PANEL
Anion gap: 12 (ref 5–15)
BUN: 29 mg/dL — ABNORMAL HIGH (ref 6–20)
CO2: 27 mmol/L (ref 22–32)
Calcium: 8.5 mg/dL — ABNORMAL LOW (ref 8.9–10.3)
Chloride: 96 mmol/L — ABNORMAL LOW (ref 98–111)
Creatinine, Ser: 1.3 mg/dL — ABNORMAL HIGH (ref 0.61–1.24)
GFR calc Af Amer: >60 mL/min (ref >60)
GFR calc non Af Amer: >60 mL/min (ref >60)
Glucose, Bld: 373 mg/dL — ABNORMAL HIGH (ref 70–99)
Potassium: 3.9 mmol/L (ref 3.5–5.1)
Sodium: 135 mmol/L (ref 135–145)

## 2019-12-31 LAB — PROTIME-INR
INR: 1 (ref 0.8–1.2)
Prothrombin Time: 12.8 seconds (ref 11.4–15.2)

## 2019-12-31 LAB — HEPARIN LEVEL (UNFRACTIONATED): Heparin Unfractionated: 0.53 IU/mL (ref 0.30–0.70)

## 2019-12-31 LAB — APTT: aPTT: 26 seconds (ref 24–36)

## 2019-12-31 LAB — CBC
HCT: 53.2 % — ABNORMAL HIGH (ref 39.0–52.0)
Hemoglobin: 17.8 g/dL — ABNORMAL HIGH (ref 13.0–17.0)
MCH: 27.4 pg (ref 26.0–34.0)
MCHC: 33.5 g/dL (ref 30.0–36.0)
MCV: 82 fL (ref 80.0–100.0)
Platelets: 264 10*3/uL (ref 150–400)
RBC: 6.49 MIL/uL — ABNORMAL HIGH (ref 4.22–5.81)
RDW: 14.8 % (ref 11.5–15.5)
WBC: 11.5 10*3/uL — ABNORMAL HIGH (ref 4.0–10.5)
nRBC: 0 % (ref 0.0–0.2)

## 2019-12-31 LAB — SARS CORONAVIRUS 2 BY RT PCR (HOSPITAL ORDER, PERFORMED IN ~~LOC~~ HOSPITAL LAB): SARS Coronavirus 2: NEGATIVE

## 2019-12-31 LAB — GLUCOSE, CAPILLARY: Glucose-Capillary: 256 mg/dL — ABNORMAL HIGH (ref 70–99)

## 2019-12-31 MED ORDER — SODIUM CHLORIDE 0.9 % IV BOLUS
250.0000 mL | Freq: Once | INTRAVENOUS | Status: AC
  Administered 2019-12-31: 250 mL via INTRAVENOUS

## 2019-12-31 MED ORDER — NITROGLYCERIN 0.4 MG SL SUBL: 0.4000 mg | SUBLINGUAL_TABLET | SUBLINGUAL | Status: DC | PRN

## 2019-12-31 MED ORDER — HYDROCOD POLST-CPM POLST ER 10-8 MG/5ML PO SUER
5.0000 mL | Freq: Once | ORAL | Status: AC
  Administered 2019-12-31: 5 mL via ORAL
  Filled 2019-12-31: qty 5

## 2019-12-31 MED ORDER — INSULIN ASPART 100 UNIT/ML ~~LOC~~ SOLN
3.0000 [IU] | Freq: Three times a day (TID) | SUBCUTANEOUS | Status: DC
  Administered 2020-01-01 – 2020-01-02 (×4): 3 [IU] via SUBCUTANEOUS
  Filled 2019-12-31 (×4): qty 1

## 2019-12-31 MED ORDER — BENZONATATE 100 MG PO CAPS
200.0000 mg | ORAL_CAPSULE | Freq: Three times a day (TID) | ORAL | Status: DC
  Filled 2019-12-31 (×3): qty 2

## 2019-12-31 MED ORDER — INSULIN ASPART 100 UNIT/ML ~~LOC~~ SOLN
0.0000 [IU] | Freq: Three times a day (TID) | SUBCUTANEOUS | Status: DC
  Administered 2020-01-01 (×2): 3 [IU] via SUBCUTANEOUS
  Administered 2020-01-01 – 2020-01-02 (×2): 5 [IU] via SUBCUTANEOUS
  Filled 2019-12-31 (×4): qty 1

## 2019-12-31 MED ORDER — LEVOFLOXACIN 500 MG PO TABS
750.0000 mg | ORAL_TABLET | Freq: Every day | ORAL | Status: DC
  Administered 2020-01-01 – 2020-01-02 (×2): 750 mg via ORAL
  Filled 2019-12-31 (×2): qty 2

## 2019-12-31 MED ORDER — ROSUVASTATIN CALCIUM 20 MG PO TABS
20.0000 mg | ORAL_TABLET | Freq: Every day | ORAL | Status: DC
  Administered 2020-01-01: 20 mg via ORAL
  Filled 2019-12-31 (×3): qty 1

## 2019-12-31 MED ORDER — HEPARIN BOLUS VIA INFUSION
6000.0000 [IU] | Freq: Once | INTRAVENOUS | Status: AC
  Administered 2019-12-31: 6000 [IU] via INTRAVENOUS
  Filled 2019-12-31: qty 6000

## 2019-12-31 MED ORDER — IOHEXOL 350 MG/ML SOLN
75.0000 mL | Freq: Once | INTRAVENOUS | Status: AC | PRN
  Administered 2019-12-31: 75 mL via INTRAVENOUS

## 2019-12-31 MED ORDER — CARVEDILOL 25 MG PO TABS
25.0000 mg | ORAL_TABLET | Freq: Two times a day (BID) | ORAL | Status: DC
  Administered 2019-12-31 – 2020-01-01 (×2): 25 mg via ORAL
  Filled 2019-12-31 (×2): qty 1

## 2019-12-31 MED ORDER — MORPHINE SULFATE (PF) 4 MG/ML IV SOLN
4.0000 mg | Freq: Once | INTRAVENOUS | Status: AC
  Administered 2019-12-31: 4 mg via INTRAVENOUS
  Filled 2019-12-31: qty 1

## 2019-12-31 MED ORDER — TORSEMIDE 20 MG PO TABS
40.0000 mg | ORAL_TABLET | Freq: Two times a day (BID) | ORAL | Status: DC
  Administered 2020-01-01: 40 mg via ORAL
  Filled 2019-12-31: qty 2

## 2019-12-31 MED ORDER — SPIRONOLACTONE 25 MG PO TABS
25.0000 mg | ORAL_TABLET | Freq: Two times a day (BID) | ORAL | Status: DC
  Administered 2020-01-01: 25 mg via ORAL
  Filled 2019-12-31: qty 1

## 2019-12-31 MED ORDER — HEPARIN (PORCINE) 25000 UT/250ML-% IV SOLN
1650.0000 [IU]/h | INTRAVENOUS | Status: DC
  Administered 2019-12-31 – 2020-01-01 (×2): 1650 [IU]/h via INTRAVENOUS
  Filled 2019-12-31 (×3): qty 250

## 2019-12-31 MED ORDER — HYDROCOD POLST-CPM POLST ER 10-8 MG/5ML PO SUER: 5.0000 mL | Freq: Two times a day (BID) | ORAL | Status: DC | PRN

## 2019-12-31 MED ORDER — LOSARTAN POTASSIUM 50 MG PO TABS
100.0000 mg | ORAL_TABLET | Freq: Every day | ORAL | Status: DC
  Administered 2020-01-01 – 2020-01-02 (×2): 100 mg via ORAL
  Filled 2019-12-31 (×2): qty 2

## 2019-12-31 MED ORDER — ACETAMINOPHEN 650 MG RE SUPP: 650.0000 mg | Freq: Four times a day (QID) | RECTAL | Status: DC | PRN

## 2019-12-31 MED ORDER — ACETAMINOPHEN 325 MG PO TABS: 650.0000 mg | ORAL_TABLET | Freq: Four times a day (QID) | ORAL | Status: DC | PRN

## 2019-12-31 MED ORDER — INSULIN DETEMIR 100 UNIT/ML ~~LOC~~ SOLN
15.0000 [IU] | Freq: Every day | SUBCUTANEOUS | Status: DC
  Administered 2019-12-31 – 2020-01-01 (×2): 15 [IU] via SUBCUTANEOUS
  Filled 2019-12-31 (×3): qty 0.15

## 2019-12-31 MED ORDER — CLONIDINE HCL 0.1 MG PO TABS
0.1000 mg | ORAL_TABLET | Freq: Two times a day (BID) | ORAL | Status: DC
  Administered 2019-12-31 – 2020-01-01 (×2): 0.1 mg via ORAL
  Filled 2019-12-31 (×2): qty 1

## 2019-12-31 MED ORDER — COLCHICINE 0.6 MG PO TABS: 0.6000 mg | ORAL_TABLET | Freq: Every day | ORAL | Status: DC | PRN

## 2019-12-31 MED ORDER — INSULIN ASPART 100 UNIT/ML ~~LOC~~ SOLN
0.0000 [IU] | Freq: Every day | SUBCUTANEOUS | Status: DC
  Administered 2019-12-31: 3 [IU] via SUBCUTANEOUS
  Administered 2020-01-01: 2 [IU] via SUBCUTANEOUS
  Filled 2019-12-31 (×2): qty 1

## 2019-12-31 MED ORDER — OXYCODONE HCL 5 MG PO TABS
5.0000 mg | ORAL_TABLET | ORAL | Status: DC | PRN
  Administered 2019-12-31: 5 mg via ORAL
  Filled 2019-12-31: qty 1

## 2019-12-31 MED ORDER — ONDANSETRON HCL 4 MG PO TABS: 4.0000 mg | ORAL_TABLET | Freq: Four times a day (QID) | ORAL | Status: DC | PRN

## 2019-12-31 MED ORDER — ONDANSETRON HCL 4 MG/2ML IJ SOLN: 4.0000 mg | Freq: Four times a day (QID) | INTRAMUSCULAR | Status: DC | PRN

## 2019-12-31 NOTE — H&P (Addendum)
[facility] [place] NAME: Gabriel [name]    MR#:  [id number]  DATE OF BIRTH:  03/20/1971  DATE OF ADMISSION:  12/31/2019  PRIMARY CARE PHYSICIAN: Alisa Graff, FNP   REQUESTING/REFERRING PHYSICIAN: Dr Fredirick Maudlin  CHIEF COMPLAINT:   Chief Complaint  Patient presents with  . Hemoptysis    HISTORY OF PRESENT ILLNESS:  Gabriel [name]  is a 49 y.o. male with a known history of diabetes, congestive heart failure, hypertension hyperlipidemia presents with shortness of breath going on for a few days and coughing up blood.  He states that he has been feeling very weak.  It is hard to breathe deep.  He has been coughing up blood and in the emergency room he coughed up a blood a few tablespoons.  Yesterday he said he went to [facility] and vomited and had diarrhea and he thinks he may have blacked out and he did not remember why he was there.  He called [name] when he got home.  She returned his call this morning and advised to come to the hospital.  In the ER he was found to have pulmonary emboli and pulmonary infarct.  Hospitalist services were contacted for further evaluation.  PAST MEDICAL HISTORY:   Past Medical History:  Diagnosis Date  . Chest wall pain, chronic   . Chronic systolic CHF (congestive heart failure) ([place])    a. 03/2015 Echo: EF 45-50%; b. 12/2015 Echo: EF 20-25%; c. 02/2016 Echo: EF 30-35%; d. 11/2016 Echo: EF 40-45%; e. 06/2019 Echo: EF 30-35%.  . Chronic Troponin Elevation   . CKD (chronic kidney disease), stage III   . COPD (chronic obstructive pulmonary disease) ([place])   . Diabetes mellitus without complication ([place])   . Hypertension    Resolved since weight loss  . NICM (nonischemic cardiomyopathy) ([place])    a. 03/2015 Echo: EF 45-50%; b. 04/2015 MV: small defect of mild severity in apex 2/2 apical thinning, EF 30-44%;  c. 12/2015 Echo: EF 20-25%; d. 02/2016 Echo: EF 30-35%; e. 11/2016 Echo: EF 40-45%, diff HK, mildly dil Ao  root, mild MR, mod dil LA;  f. 12/2016 Cath: LM nl, LAD/Diags/LCX/OMs min irregs, RCA 40p/m/d; g. 06/2019 Echo: EF 30-35%, glob HK. Mod dil LA. RVSP 58.4 mmHg.  . Non-obstructive CAD (coronary artery disease)    a. 04/2015 low risk MV;  b. 12/2016 Cath: minor irregs in LAD/Diag/LCX/OM, RCA 40p/m/d.  [name] (nonsustained ventricular tachycardia) ([place])    a. 12/2015 noted on tele-->amio;  b. 12/2015 Event monitor: no VT. Atach noted.  . Obesity (BMI 30.0-34.9)   . Psoriasis   . Renal insufficiency   . Syncope    a. 01/2016 - felt to be vasovagal.  . Tobacco abuse     PAST SURGICAL HISTORY:   Past Surgical History:  Procedure Laterality Date  . AMPUTATION    . CARDIAC CATHETERIZATION    . FINGER AMPUTATION     Traumatic  . FINGER FRACTURE SURGERY Left   . LEFT HEART CATH AND CORONARY ANGIOGRAPHY N/A 01/06/2017   Procedure: Left Heart Cath and Coronary Angiography;  Surgeon: Wellington Hampshire, MD;  Location: [facility];  Service: Cardiovascular;  Laterality: N/A;    SOCIAL HISTORY:   Social History   Tobacco Use  . Smoking status: Former Smoker    Packs/day: 0.50    Years: 33.00    Pack years: 16.50    Types: Cigarettes    Quit date: 08/18/2019  Years since quitting: 0.3  . Smokeless tobacco: Never Used  . Tobacco comment: Quit a little over a month ago.  Substance Use Topics  . Alcohol use: Yes    Alcohol/week: 0.0 standard drinks    Comment: occassionally    FAMILY HISTORY:   Family History  Problem Relation Age of Onset  . Diabetes Mellitus II Mother   . Hypothyroidism Mother   . Hypertension Mother   . Hypertension Father   . Gout Father   . Cancer Paternal Aunt   . Cancer Maternal Grandfather   . Diabetes Maternal Grandfather     DRUG ALLERGIES:   Allergies  Allergen Reactions  . Metformin And Related Nausea And Vomiting  . Prednisone Other (See Comments)    Reaction: Hallucinations    . Zantac [Ranitidine Hcl] Diarrhea and Nausea Only     Night sweats    REVIEW OF SYSTEMS:  CONSTITUTIONAL: No fever, chills or sweats.  Positive for fatigue.  EYES: Positive for blurred vision EARS, NOSE, AND THROAT: No tinnitus or ear pain.  Positive for runny nose.  Feels like his throat is swollen and he has difficulty swallowing RESPIRATORY: Positive for cough, shortness of breath, wheezing and hemoptysis.  CARDIOVASCULAR: Positive for chest pain.  GASTROINTESTINAL: Positive for nausea, vomiting, diarrhea and abdominal pain. No blood in bowel movements but may have seen blood in the vomit. GENITOURINARY: No dysuria, hematuria.  ENDOCRINE: No polyuria, nocturia,  HEMATOLOGY: No anemia, easy bruising or bleeding SKIN: No rash or lesion. MUSCULOSKELETAL: Positive for joint pain NEUROLOGIC: No tingling, numbness, weakness.  PSYCHIATRY: No anxiety or depression.   MEDICATIONS AT HOME:   Prior to Admission medications   Medication Sig Start Date End Date Taking? Authorizing Provider  aspirin 81 MG chewable tablet Chew 1 tablet (81 mg total) by mouth daily. Reported on 02/08/2016 08/27/18   Alisa Graff, FNP  benzonatate (TESSALON) 200 MG capsule Take 1 capsule (200 mg total) by mouth 3 (three) times daily. 12/23/19   Lorella Nimrod, MD  carvedilol (COREG) 25 MG tablet Take 1 tablet (25 mg total) by mouth 2 (two) times daily with a meal. 12/23/19   Lorella Nimrod, MD  cloNIDine (CATAPRES) 0.1 MG tablet Take 1 tablet (0.1 mg total) by mouth 2 (two) times daily. 12/01/19 12/31/19  Alisa Graff, FNP  colchicine 0.6 MG tablet Take 1 tablet (0.6 mg total) by mouth daily. Take 1 tab daily for at least 6 more days. If  Symptoms persist past 6 days continue to use until prescription is finished 12/11/19   Cuthriell, Roderic Palau D, PA-C  insulin detemir (LEVEMIR) 100 UNIT/ML injection Inject 0.24 mLs (24 Units total) into the skin at bedtime. Patient not taking: Reported on 12/20/2019 09/23/19 12/20/19  [name], MD  losartan (COZAAR) 100 MG tablet Take 1 tablet (100  mg total) by mouth daily. 12/29/19 03/28/20  Minna Merritts, MD  nitroGLYCERIN (NITROSTAT) 0.4 MG SL tablet Place 1 tablet (0.4 mg total) under the tongue every 5 (five) minutes x 3 doses as needed for chest pain. 06/27/19   [name], MD  rosuvastatin (CRESTOR) 20 MG tablet Take 1 tablet (20 mg total) by mouth daily. Patient taking differently: Take 20 mg by mouth at bedtime.  12/01/19   Alisa Graff, FNP  spironolactone (ALDACTONE) 25 MG tablet Take 1 tablet (25 mg total) by mouth 2 (two) times daily. 12/01/19 12/31/19  Alisa Graff, FNP  torsemide (DEMADEX) 20 MG tablet Take 2 tablets (40 mg total)  by mouth 2 (two) times daily. 12/01/19   Alisa Graff, FNP      VITAL SIGNS:  Blood pressure 117/68, pulse 68, temperature (!) 97.5 F (36.4 C), temperature source Oral, resp. rate 18, height 6' (1.829 m), weight 104 kg, SpO2 96 %.  PHYSICAL EXAMINATION:  GENERAL:  49 y.o.-year-old patient lying in the bed with no acute distress.  EYES: Pupils equal, round, reactive to light and accommodation. No scleral icterus. HEENT: Head atraumatic, normocephalic. Oropharynx and nasopharynx clear.  NECK:  Supple, no jugular venous distention. No thyroid enlargement, no tenderness.  LUNGS: Decreased breath sounds bilateral bases, slight expiratory wheezing.  No rales,rhonchi or crepitation. No use of accessory muscles of respiration.  CARDIOVASCULAR: S1, S2 normal. No murmurs, rubs, or gallops.  ABDOMEN: Soft, nontender, nondistended. Bowel sounds present. No organomegaly or mass.  EXTREMITIES: No pedal edema, cyanosis, or clubbing.  NEUROLOGIC: Cranial nerves II through XII are intact. Muscle strength 5/5 in all extremities. Sensation intact. Gait not checked.  PSYCHIATRIC: The patient is alert and oriented x 3.  SKIN: No rash, lesion, or ulcer.   LABORATORY PANEL:   CBC Recent Labs  Lab 12/31/19 1019  WBC 11.5*  HGB 17.8*  HCT 53.2*  PLT 264    ------------------------------------------------------------------------------------------------------------------  Chemistries  Recent Labs  Lab 12/27/19 1038 12/27/19 1038 12/31/19 1019  NA 136  --  135  K 4.9   < > 3.9  CL 94*   < > 96*  CO2 25   < > 27  GLUCOSE 339*  --  373*  BUN 31*  --  29*  CREATININE 1.31*   < > 1.30*  CALCIUM 9.2   < > 8.5*  AST 22  --   --   ALT 59*  --   --   ALKPHOS 89  --   --   BILITOT 1.0  --   --    < > = values in this interval not displayed.   ------------------------------------------------------------------------------------------------------------------    RADIOLOGY:  DG Chest 2 View  Result Date: 12/31/2019 CLINICAL DATA:  Hemoptysis EXAM: CHEST - 2 VIEW COMPARISON:  Eleven days ago FINDINGS: No convincing infiltrate. Small area of increased density over the lower thoracic posterior elements is most likely from overlapping ribs. No air bronchograms, subpleural wedge of opacity, edema, effusion, or pneumothorax. Chronic cardiomegaly. Degenerative endplate spurring. IMPRESSION: Cardiomegaly without failure. Electronically Signed   By: Monte Fantasia M.D.   On: 12/31/2019 11:54   CT Angio Chest PE W and/or Wo Contrast  Result Date: 12/31/2019 CLINICAL DATA:  Shortness of breath, CHF, hemoptysis EXAM: CT ANGIOGRAPHY CHEST WITH CONTRAST TECHNIQUE: Multidetector CT imaging of the chest was performed using the standard protocol during bolus administration of intravenous contrast. Multiplanar CT image reconstructions and MIPs were obtained to evaluate the vascular anatomy. CONTRAST:  65mL OMNIPAQUE IOHEXOL 350 MG/ML SOLN COMPARISON:  09/30/2017 FINDINGS: Cardiovascular: Trace pericardial fluid. There is four-chamber cardiac enlargement. Punctate metallic densities or calcifications in the right ventricle as before, nonspecific. Incompletely occlusive pulmonary emboli in the distal right lower lobe pulmonary artery extending into segmental  branches. Posterior right upper lobe segmental PE. No definite left PE. Scattered coronary calcifications. Incomplete opacification of the thoracic aorta, normal in caliber. Mediastinum/Nodes: Subcarinal nodes up to 1.5 cm short axis. Multiple prominent right paratracheal and prevascular lymph nodes. Lungs/Pleura: No pleural effusion. No pneumothorax. Peripheral airspace consolidation in posterior and medial basal segments right lower lobe. Platelike atelectasis laterally in the left upper lobe. Upper Abdomen:  No acute findings. Musculoskeletal: Anterior vertebral endplate spurring at multiple levels in the lower thoracic spine. No fracture or worrisome bone lesion. Review of the MIP images confirms the above findings. IMPRESSION: 1. POSITIVE for right upper and lower lobe acute pulmonary emboli as above. 2. Right lower lobe airspace consolidation may represent pneumonia or pulmonary infarct. Critical Value/emergent results were called by telephone at the time of interpretation on 12/31/2019 at 3:59 pm to provider ED physician, who verbally acknowledged these results. 3. Nonspecific mediastinal adenopathy, possibly reactive. 4. Coronary calcifications. The severity of coronary artery disease and any potential stenosis cannot be assessed on this non-gated CT examination. Electronically Signed   By: Lucrezia Europe M.D.   On: 12/31/2019 16:02    EKG:   Normal sinus rhythm 83 bpm, left atrial enlargement.  IMPRESSION AND PLAN:   1.  Pulmonary emboli in the right upper lobe and right lower lobe with likely pulmonary infarct in the right lower lobe versus pneumonia.  Patient also has hemoptysis.  ER physician ordered heparin drip.  I will get an ultrasound of the lower extremities to rule out DVT.  Need to watch the patient closely in the hospital for massive hemoptysis.  Usually once we start the blood thinner the hemoptysis gradually gets better.  Possible pneumonia p.o. Levaquin ordered.  More likely this is  pulmonary infarct. 2.  Chronic systolic congestive heart failure.  Patient on Coreg, losartan, spironolactone and torsemide.  Looks like cardiology is trying to get [facility] for him as an outpatient. 3.  Essential hypertension continue Coreg, clonidine, Cozaar and spironolactone 4.  Type 2 diabetes mellitus with hyperlipidemia.  Will order Levemir insulin and sliding scale with short acting insulin prior to meals.  Continue Crestor 5.  Gout on as needed colchicine  All the records, laboratory and radiological data are reviewed and case discussed with ED provider. Management plans discussed with the patient, and he is in agreement.  The patient requires inpatient status with pulmonary emboli, pulmonary infarct and hemoptysis.  With starting the blood thinner need to watch closely for massive hemoptysis and worsening respiratory status.  CODE STATUS: Full Code  TOTAL TIME TAKING CARE OF THIS PATIENT: 50 minutes.    Loletha Grayer M.D on 12/31/2019 at 5:15 PM  Between 7am to 6pm - Pager - [phone]  After 6pm call admission pager [phone]  [facility]  CC: Primary care physician; Alisa Graff, FNP

## 2019-12-31 NOTE — ED Provider Notes (Signed)
[facility] ED provider Note       Time seen: ----------------------------------------- 1:31 PM on 12/31/2019 -----------------------------------------   I have reviewed the vital signs and the nursing notes.  HISTORY   Chief Complaint Hemoptysis    HPI Gabriel [name]. is a 49 y.o. male with a history of chronic systolic heart failure, CKD, COPD, diabetes, hypertension, renal sufficiency who presents to the clinic for hemoptysis.  Patient was recently admitted for congestive heart failure, began seeing blood when he was coughing in the hospital.  He complains of generalized weakness.  Past Medical History:  Diagnosis Date  . Chest wall pain, chronic   . Chronic systolic CHF (congestive heart failure) ([place])    a. 03/2015 Echo: EF 45-50%; b. 12/2015 Echo: EF 20-25%; c. 02/2016 Echo: EF 30-35%; d. 11/2016 Echo: EF 40-45%; e. 06/2019 Echo: EF 30-35%.  . Chronic Troponin Elevation   . CKD (chronic kidney disease), stage III   . COPD (chronic obstructive pulmonary disease) ([place])   . Diabetes mellitus without complication ([place])   . Hypertension    Resolved since weight loss  . NICM (nonischemic cardiomyopathy) ([place])    a. 03/2015 Echo: EF 45-50%; b. 04/2015 MV: small defect of mild severity in apex 2/2 apical thinning, EF 30-44%;  c. 12/2015 Echo: EF 20-25%; d. 02/2016 Echo: EF 30-35%; e. 11/2016 Echo: EF 40-45%, diff HK, mildly dil Ao root, mild MR, mod dil LA;  f. 12/2016 Cath: LM nl, LAD/Diags/LCX/OMs min irregs, RCA 40p/m/d; g. 06/2019 Echo: EF 30-35%, glob HK. Mod dil LA. RVSP 58.4 mmHg.  . Non-obstructive CAD (coronary artery disease)    a. 04/2015 low risk MV;  b. 12/2016 Cath: minor irregs in LAD/Diag/LCX/OM, RCA 40p/m/d.  [name] (nonsustained ventricular tachycardia) ([place])    a. 12/2015 noted on tele-->amio;  b. 12/2015 Event monitor: no VT. Atach noted.  . Obesity (BMI 30.0-34.9)   . Psoriasis   . Renal insufficiency   . Syncope    a. 01/2016 - felt to be vasovagal.  . Tobacco abuse      Past Surgical History:  Procedure Laterality Date  . AMPUTATION    . CARDIAC CATHETERIZATION    . FINGER AMPUTATION     Traumatic  . FINGER FRACTURE SURGERY Left   . LEFT HEART CATH AND CORONARY ANGIOGRAPHY N/A 01/06/2017   Procedure: Left Heart Cath and Coronary Angiography;  Surgeon: Wellington Hampshire, MD;  Location: [place] [facility];  Service: Cardiovascular;  Laterality: N/A;    Allergies Metformin and related, Prednisone, and Zantac [ranitidine hcl]   Review of Systems Constitutional: Negative for fever. Cardiovascular: Negative for chest pain. Respiratory: Negative for shortness of breath.  Positive for hemoptysis Gastrointestinal: Negative for abdominal pain, vomiting and diarrhea. Musculoskeletal: Negative for back pain. Skin: Negative for rash. Neurological: Negative for headaches, focal weakness or numbness.  Positive for weakness  All systems negative/normal/unremarkable except as stated in the HPI  ____________________________________________   PHYSICAL EXAM:  VITAL SIGNS: ED Triage Vitals  Enc Vitals Group     BP 12/31/19 1018 (!) 142/107     Pulse Rate 12/31/19 1016 84     Resp 12/31/19 1016 18     Temp 12/31/19 1016 (!) 97.5 F (36.4 C)     Temp Source 12/31/19 1016 Oral     SpO2 12/31/19 1016 98 %     Weight 12/31/19 1017 229 lb 4.5 oz (104 kg)     Height 12/31/19 1017 6' (1.829 m)  Head Circumference --      Peak Flow --      Pain Score 12/31/19 1016 6     Pain Loc --      Pain Edu? --      Excl. in [place]? --     Constitutional: Alert and oriented. Well appearing and in no distress. Eyes: Conjunctivae are normal. Normal extraocular movements. ENT      Head: Normocephalic and atraumatic.      Nose: No congestion/rhinnorhea.      Mouth/Throat: Mucous membranes are moist.      Neck: No stridor. Cardiovascular: Normal rate, regular rhythm. No murmurs, rubs, or gallops. Respiratory: Normal respiratory effort without tachypnea nor  retractions. Breath sounds are clear and equal bilaterally. No wheezes/rales/rhonchi. Gastrointestinal: Soft and nontender. Normal bowel sounds Musculoskeletal: Nontender with normal range of motion in extremities. No lower extremity tenderness nor edema. Neurologic:  Normal speech and language. No gross focal neurologic deficits are appreciated.  Skin:  Skin is warm, dry and intact. No rash noted. Psychiatric: Speech and behavior are normal.  ____________________________________________  EKG: Interpreted by me.  Sinus rhythm with rate of 83 bpm, normal PR interval, normal QRS, normal QT  ____________________________________________   LABS (pertinent positives/negatives)  Labs Reviewed  BASIC METABOLIC PANEL - Abnormal; Notable for the following components:      Result Value   Chloride 96 (*)    Glucose, Bld 373 (*)    BUN 29 (*)    Creatinine, Ser 1.30 (*)    Calcium 8.5 (*)    All other components within normal limits  CBC - Abnormal; Notable for the following components:   WBC 11.5 (*)    RBC 6.49 (*)    Hemoglobin 17.8 (*)    HCT 53.2 (*)    All other components within normal limits  URINALYSIS, COMPLETE (UACMP) WITH MICROSCOPIC   Radiology: CXR IMPRESSION: Cardiomegaly without failure.  Differential diagnosis: Bronchitis, pneumonia, CHF, COPD, COVID-19, PE  ASSESSMENT AND PLAN  Hemoptysis   Plan: The patient had presented for hemoptysis. Patient's labs are grossly unremarkable.  CTA is pending at this time.  Lenise Arena MD    Note: This note was generated in part or whole with voice recognition software. Voice recognition is usually quite accurate but there are transcription errors that can and very often do occur. I apologize for any typographical errors that were not detected and corrected.     Earleen Newport, MD 12/31/19 [phone]

## 2019-12-31 NOTE — ED Notes (Signed)
This RN out to lobby to retrieve another patient, pt visibly upset, states "what's taking so long? There's not that many people here why aren't y'all seeing patients?" This RN apologized at that time and explained wait.  At this time, pt yelling across the lobby at this RN, "what are y'all doing, seeing one patient at a time?" this RN once again apologized and explained the delay.

## 2019-12-31 NOTE — ED Triage Notes (Signed)
Pt was recently admitted for CHF, began seeing blood when coughing for discharge. States that this has continued since discharged. Reports generalized weakness. Pt alert and oriented X4, cooperative, RR even and unlabored, color WNL. Pt in NAD.

## 2019-12-31 NOTE — ED Notes (Signed)
Assigned bed @ 1730, spoke with RN Anderson Malta.

## 2019-12-31 NOTE — Telephone Encounter (Signed)
Spoke with patient this morning regarding his cough. He says that prior to recent discharge, he started coughing up some blood but it wasn't much. He says that he's been coughing "a lot" and is now coughing up quite a bit of blood. He says that he really hasn't noticed if it's bright red or dull.   He also says that yesterday at [facility], he got confused and felt "out of it" but that resolved after sitting.   He is actively coughing while on the phone and it sounds like a loose cough. Asked who his PCP was and he said it was this provider. Explained that I see him for his HF but I do not treat primary care issues (he has previously seen provider at [facility]). Explained that he should go to an Urgent Care for an evaluation and possibly another chest xray.   Patient verbalized understanding.

## 2019-12-31 NOTE — Consult Note (Signed)
ANTICOAGULATION CONSULT NOTE - Initial Consult  Pharmacy Consult for Heparin Infusion Indication: VTE Treatment  Allergies  Allergen Reactions  . Metformin And Related Nausea And Vomiting  . Prednisone Other (See Comments)    Reaction: Hallucinations    . Zantac [Ranitidine Hcl] Diarrhea and Nausea Only    Night sweats    Patient Measurements: Height: 6' (182.9 cm) Weight: 104 kg (229 lb 4.5 oz) IBW/kg (Calculated) : 77.6 Heparin Dosing Weight: 99.1 kg  Vital Signs: Temp: 97.5 F (36.4 C) (05/14 1016) Temp Source: Oral (05/14 1016) BP: 117/68 (05/14 1626) Pulse Rate: 68 (05/14 1626)  Labs: Recent Labs    12/31/19 1019 12/31/19 1634  HGB 17.8*  --   HCT 53.2*  --   PLT 264  --   LABPROT  --  12.8  INR  --  1.0  CREATININE 1.30*  --     Estimated Creatinine Clearance: 84.8 mL/min (A) (by C-G formula based on SCr of 1.3 mg/dL (H)).   Medical History: Past Medical History:  Diagnosis Date  . Chest wall pain, chronic   . Chronic systolic CHF (congestive heart failure) ([place])    a. 03/2015 Echo: EF 45-50%; b. 12/2015 Echo: EF 20-25%; c. 02/2016 Echo: EF 30-35%; d. 11/2016 Echo: EF 40-45%; e. 06/2019 Echo: EF 30-35%.  . Chronic Troponin Elevation   . CKD (chronic kidney disease), stage III   . COPD (chronic obstructive pulmonary disease) ([place])   . Diabetes mellitus without complication ([place])   . Hypertension    Resolved since weight loss  . NICM (nonischemic cardiomyopathy) ([place])    a. 03/2015 Echo: EF 45-50%; b. 04/2015 MV: small defect of mild severity in apex 2/2 apical thinning, EF 30-44%;  c. 12/2015 Echo: EF 20-25%; d. 02/2016 Echo: EF 30-35%; e. 11/2016 Echo: EF 40-45%, diff HK, mildly dil Ao root, mild MR, mod dil LA;  f. 12/2016 Cath: LM nl, LAD/Diags/LCX/OMs min irregs, RCA 40p/m/d; g. 06/2019 Echo: EF 30-35%, glob HK. Mod dil LA. RVSP 58.4 mmHg.  . Non-obstructive CAD (coronary artery disease)    a. 04/2015 low risk MV;  b. 12/2016 Cath: minor irregs in  LAD/Diag/LCX/OM, RCA 40p/m/d.  [name] (nonsustained ventricular tachycardia) ([place])    a. 12/2015 noted on tele-->amio;  b. 12/2015 Event monitor: no VT. Atach noted.  . Obesity (BMI 30.0-34.9)   . Psoriasis   . Renal insufficiency   . Syncope    a. 01/2016 - felt to be vasovagal.  . Tobacco abuse     Medications:  (Not in a hospital admission)  Scheduled:  . benzonatate  200 mg Oral TID  . carvedilol  25 mg Oral BID WC  . cloNIDine  0.1 mg Oral BID  . [START ON 01/01/2020] insulin aspart  0-15 Units Subcutaneous TID WC  . insulin aspart  0-5 Units Subcutaneous QHS  . [START ON 01/01/2020] insulin aspart  3 Units Subcutaneous TID WC  . insulin detemir  15 Units Subcutaneous QHS  . [START ON 01/01/2020] losartan  100 mg Oral Daily  . rosuvastatin  20 mg Oral q1800  . spironolactone  25 mg Oral BID  . torsemide  40 mg Oral BID   Infusions:  . heparin 1,650 Units/hr (12/31/19 1725)   PRN: acetaminophen **OR** acetaminophen, colchicine, nitroGLYCERIN, ondansetron **OR** ondansetron (ZOFRAN) IV, oxyCODONE Anti-infectives (From admission, onward)   None      Assessment: Pharmacy has been consulted to initiate heparin infusion on 48yo with ongoing shortness of breath for a few  days and coughing up blood. Takes daily ASA 81mg  only, no history of PTA anticoagulant use. CT of chest was positive for right upper and lower lobe acute pulmonary emboli and found right lower lobe airspace consolidation which may represent pneumonia or pulmonary infarct. Baseline labs have been ordered and are pending.   Goal of Therapy:  Heparin level 0.3-0.7 units/ml Monitor platelets by anticoagulation protocol: Yes   Plan:  Give 6000 units bolus x 1 Start heparin infusion at 1650 units/hr Check anti-Xa level in 6 hours and daily while on heparin Continue to monitor H&H and platelets  Alitza Cowman A Breeanna Galgano 12/31/2019,5:33 PM

## 2020-01-01 DIAGNOSIS — I2699 Other pulmonary embolism without acute cor pulmonale: Secondary | ICD-10-CM

## 2020-01-01 DIAGNOSIS — Z86711 Personal history of pulmonary embolism: Secondary | ICD-10-CM

## 2020-01-01 LAB — BASIC METABOLIC PANEL
Anion gap: 10 (ref 5–15)
BUN: 36 mg/dL — ABNORMAL HIGH (ref 6–20)
CO2: 28 mmol/L (ref 22–32)
Calcium: 8.6 mg/dL — ABNORMAL LOW (ref 8.9–10.3)
Chloride: 98 mmol/L (ref 98–111)
Creatinine, Ser: 1.26 mg/dL — ABNORMAL HIGH (ref 0.61–1.24)
GFR calc Af Amer: >60 mL/min (ref >60)
GFR calc non Af Amer: >60 mL/min (ref >60)
Glucose, Bld: 185 mg/dL — ABNORMAL HIGH (ref 70–99)
Potassium: 3.7 mmol/L (ref 3.5–5.1)
Sodium: 136 mmol/L (ref 135–145)

## 2020-01-01 LAB — HEPARIN LEVEL (UNFRACTIONATED): Heparin Unfractionated: 0.59 IU/mL (ref 0.30–0.70)

## 2020-01-01 LAB — GLUCOSE, CAPILLARY
Glucose-Capillary: 161 mg/dL — ABNORMAL HIGH (ref 70–99)
Glucose-Capillary: 176 mg/dL — ABNORMAL HIGH (ref 70–99)
Glucose-Capillary: 211 mg/dL — ABNORMAL HIGH (ref 70–99)
Glucose-Capillary: 223 mg/dL — ABNORMAL HIGH (ref 70–99)

## 2020-01-01 LAB — CBC
HCT: 47.6 % (ref 39.0–52.0)
Hemoglobin: 16.1 g/dL (ref 13.0–17.0)
MCH: 28 pg (ref 26.0–34.0)
MCHC: 33.8 g/dL (ref 30.0–36.0)
MCV: 82.9 fL (ref 80.0–100.0)
Platelets: 224 10*3/uL (ref 150–400)
RBC: 5.74 MIL/uL (ref 4.22–5.81)
RDW: 14.3 % (ref 11.5–15.5)
WBC: 11.3 10*3/uL — ABNORMAL HIGH (ref 4.0–10.5)
nRBC: 0 % (ref 0.0–0.2)

## 2020-01-01 MED ORDER — SPIRONOLACTONE 25 MG PO TABS
25.0000 mg | ORAL_TABLET | Freq: Two times a day (BID) | ORAL | Status: DC
  Administered 2020-01-02: 25 mg via ORAL
  Filled 2020-01-01: qty 1

## 2020-01-01 MED ORDER — TORSEMIDE 20 MG PO TABS
40.0000 mg | ORAL_TABLET | Freq: Two times a day (BID) | ORAL | Status: DC
  Administered 2020-01-02: 40 mg via ORAL
  Filled 2020-01-01: qty 2

## 2020-01-01 MED ORDER — PHENOL 1.4 % MT LIQD
1.0000 | OROMUCOSAL | Status: DC | PRN
  Administered 2020-01-01: 1 via OROMUCOSAL
  Filled 2020-01-01: qty 177

## 2020-01-01 MED ORDER — CARVEDILOL 12.5 MG PO TABS
12.5000 mg | ORAL_TABLET | Freq: Two times a day (BID) | ORAL | Status: DC
  Administered 2020-01-01: 12.5 mg via ORAL
  Filled 2020-01-01: qty 1

## 2020-01-01 NOTE — Progress Notes (Signed)
Per CCMD patein has 3 beat run of vtach. Patient currentlty resting peasefully. NP O[place] notified.

## 2020-01-01 NOTE — Consult Note (Signed)
ANTICOAGULATION CONSULT NOTE - Initial Consult  Pharmacy Consult for Heparin Infusion Indication: VTE Treatment  Allergies  Allergen Reactions  . Metformin And Related Nausea And Vomiting  . Prednisone Other (See Comments)    Reaction: Hallucinations    . Zantac [Ranitidine Hcl] Diarrhea and Nausea Only    Night sweats   Patient Measurements: Height: 6' (182.9 cm) Weight: 104.5 kg (230 lb 6.4 oz) IBW/kg (Calculated) : 77.6 Heparin Dosing Weight: 99.1 kg  Vital Signs: Temp: 98.6 F (37 C) (05/14 1926) Temp Source: Oral (05/14 1926) BP: 115/76 (05/14 1926) Pulse Rate: 65 (05/14 1926)  Labs: Recent Labs    12/31/19 1019 12/31/19 1634 12/31/19 2319  HGB 17.8*  --   --   HCT 53.2*  --   --   PLT 264  --   --   APTT  --  26  --   LABPROT  --  12.8  --   INR  --  1.0  --   HEPARINUNFRC  --   --  0.53  CREATININE 1.30*  --   --     Estimated Creatinine Clearance: 85 mL/min (A) (by C-G formula based on SCr of 1.3 mg/dL (H)).  Medical History: Past Medical History:  Diagnosis Date  . Chest wall pain, chronic   . Chronic systolic CHF (congestive heart failure) ([place])    a. 03/2015 Echo: EF 45-50%; b. 12/2015 Echo: EF 20-25%; c. 02/2016 Echo: EF 30-35%; d. 11/2016 Echo: EF 40-45%; e. 06/2019 Echo: EF 30-35%.  . Chronic Troponin Elevation   . CKD (chronic kidney disease), stage III   . COPD (chronic obstructive pulmonary disease) ([place])   . Diabetes mellitus without complication ([place])   . Hypertension    Resolved since weight loss  . NICM (nonischemic cardiomyopathy) ([place])    a. 03/2015 Echo: EF 45-50%; b. 04/2015 MV: small defect of mild severity in apex 2/2 apical thinning, EF 30-44%;  c. 12/2015 Echo: EF 20-25%; d. 02/2016 Echo: EF 30-35%; e. 11/2016 Echo: EF 40-45%, diff HK, mildly dil Ao root, mild MR, mod dil LA;  f. 12/2016 Cath: LM nl, LAD/Diags/LCX/OMs min irregs, RCA 40p/m/d; g. 06/2019 Echo: EF 30-35%, glob HK. Mod dil LA. RVSP 58.4 mmHg.  . Non-obstructive CAD (coronary  artery disease)    a. 04/2015 low risk MV;  b. 12/2016 Cath: minor irregs in LAD/Diag/LCX/OM, RCA 40p/m/d.  [name] (nonsustained ventricular tachycardia) ([place])    a. 12/2015 noted on tele-->amio;  b. 12/2015 Event monitor: no VT. Atach noted.  . Obesity (BMI 30.0-34.9)   . Psoriasis   . Renal insufficiency   . Syncope    a. 01/2016 - felt to be vasovagal.  . Tobacco abuse     Medications:  Medications Prior to Admission  Medication Sig Dispense Refill Last Dose  . aspirin 81 MG chewable tablet Chew 1 tablet (81 mg total) by mouth daily. Reported on 02/08/2016 90 tablet 3 12/31/2019 at Unknown time  . benzonatate (TESSALON) 200 MG capsule Take 1 capsule (200 mg total) by mouth 3 (three) times daily. 20 capsule 0 Unknown at PRN  . carvedilol (COREG) 25 MG tablet Take 1 tablet (25 mg total) by mouth 2 (two) times daily with a meal. 60 tablet 0 12/31/2019 at 0800  . cloNIDine (CATAPRES) 0.1 MG tablet Take 1 tablet (0.1 mg total) by mouth 2 (two) times daily. (Patient taking differently: Take 0.05 mg by mouth 2 (two) times daily. ) 180 tablet 3   . colchicine 0.6  MG tablet Take 1 tablet (0.6 mg total) by mouth daily. Take 1 tab daily for at least 6 more days. If  Symptoms persist past 6 days continue to use until prescription is finished 20 tablet 0 Unknown at PRN  . losartan (COZAAR) 100 MG tablet Take 1 tablet (100 mg total) by mouth daily. 90 tablet 3 12/31/2019 at Unknown time  . nitroGLYCERIN (NITROSTAT) 0.4 MG SL tablet Place 1 tablet (0.4 mg total) under the tongue every 5 (five) minutes x 3 doses as needed for chest pain. 30 tablet 0 Unknown at PRN  . rosuvastatin (CRESTOR) 20 MG tablet Take 1 tablet (20 mg total) by mouth daily. (Patient taking differently: Take 20 mg by mouth at bedtime. ) 90 tablet 3 12/30/2019 at 2000  . spironolactone (ALDACTONE) 25 MG tablet Take 1 tablet (25 mg total) by mouth 2 (two) times daily. 180 tablet 3 12/31/2019 at 0800  . torsemide (DEMADEX) 20 MG tablet Take 2  tablets (40 mg total) by mouth 2 (two) times daily. 360 tablet 3 12/31/2019 at 0800   Scheduled:  . benzonatate  200 mg Oral TID  . carvedilol  25 mg Oral BID WC  . cloNIDine  0.1 mg Oral BID  . insulin aspart  0-15 Units Subcutaneous TID WC  . insulin aspart  0-5 Units Subcutaneous QHS  . insulin aspart  3 Units Subcutaneous TID WC  . insulin detemir  15 Units Subcutaneous QHS  . levofloxacin  750 mg Oral Daily  . losartan  100 mg Oral Daily  . rosuvastatin  20 mg Oral q1800  . spironolactone  25 mg Oral BID  . torsemide  40 mg Oral BID   Infusions:  . heparin 1,650 Units/hr (12/31/19 1725)   PRN: acetaminophen **OR** acetaminophen, chlorpheniramine-HYDROcodone, colchicine, nitroGLYCERIN, ondansetron **OR** ondansetron (ZOFRAN) IV, oxyCODONE Anti-infectives (From admission, onward)   Start     Dose/Rate Route Frequency Ordered Stop   12/31/19 1745  levofloxacin (LEVAQUIN) tablet 750 mg     750 mg Oral Daily 12/31/19 1734 01/05/20 0959     Assessment: Pharmacy has been consulted to initiate heparin infusion on 48yo with ongoing shortness of breath for a few days and coughing up blood. Takes daily ASA 81mg  only, no history of PTA anticoagulant use. CT of chest was positive for right upper and lower lobe acute pulmonary emboli and found right lower lobe airspace consolidation which may represent pneumonia or pulmonary infarct. Baseline labs have been ordered and are pending.   Goal of Therapy:  Heparin level 0.3-0.7 units/ml Monitor platelets by anticoagulation protocol: Yes   Plan:  Give 6000 units bolus x 1 Start heparin infusion at 1650 units/hr Check anti-Xa level in 6 hours and daily while on heparin Continue to monitor H&H and platelets  0514 2319 HL 0.53, therapeutic x 1, will continue heparin at current rate and recheck HL in am to confirm.  Hart Robinsons A 01/01/2020,12:05 AM

## 2020-01-01 NOTE — Progress Notes (Signed)
Patient ID: Gabriel [name]., male   DOB: 09/04/1970, 49 y.o.   MRN: [id number] [facility] PROGRESS NOTE  [name] [id number] DOB: 03/14/1971 DOA: 12/31/2019 PCP: Alisa Graff, FNP  HPI/Subjective: Patient breathing a little more comfortably today than yesterday.  Coughing up less blood than yesterday.  No pain in his legs but did have pain a couple weeks ago.  Appetite okay.  Nurse called me this afternoon that he was a little dizzy.  Objective: Vitals:   01/01/20 0359 01/01/20 1157  BP: 107/77 100/66  Pulse: 62 70  Resp: 19   Temp: (!) 97.5 F (36.4 C)   SpO2: 96%     Intake/Output Summary (Last 24 hours) at 01/01/2020 1606 Last data filed at 01/01/2020 1156 Gross per 24 hour  Intake 120 ml  Output 1100 ml  Net -980 ml   Filed Weights   12/31/19 1017 12/31/19 1847 01/01/20 0359  Weight: 104 kg 104.5 kg 105.4 kg    ROS: Review of Systems  Constitutional: Negative for fever.  Eyes: Negative for blurred vision.  Respiratory: Positive for cough, hemoptysis and shortness of breath.   Cardiovascular: Negative for chest pain.  Gastrointestinal: Negative for abdominal pain, nausea and vomiting.  Genitourinary: Negative for dysuria.  Musculoskeletal: Negative for joint pain.  Neurological: Positive for dizziness.   Exam: Physical Exam  Constitutional: He is oriented to person, place, and time.  HENT:  Nose: No mucosal edema.  Mouth/Throat: No oropharyngeal exudate or posterior oropharyngeal edema.  Eyes: Conjunctivae and lids are normal.  Neck: Carotid bruit is not present.  Cardiovascular: S1 normal and S2 normal. Exam reveals no gallop.  No murmur heard. Respiratory: No respiratory distress. He has decreased breath sounds in the right lower field and the left lower field. He has no wheezes. He has no rhonchi. He has no rales.  GI: Soft. Bowel sounds are normal. There is no abdominal tenderness.  Musculoskeletal:     Right ankle: No swelling.      Left ankle: No swelling.  Lymphadenopathy:    He has no cervical adenopathy.  Neurological: He is alert and oriented to person, place, and time. No cranial nerve deficit.  Skin: Skin is warm. No rash noted. Nails show no clubbing.  Psychiatric: He has a normal mood and affect.      Data Reviewed: Basic Metabolic Panel: Recent Labs  Lab 12/27/19 1038 12/31/19 1019 01/01/20 0515  NA 136 135 136  K 4.9 3.9 3.7  CL 94* 96* 98  CO2 25 27 28   GLUCOSE 339* 373* 185*  BUN 31* 29* 36*  CREATININE 1.31* 1.30* 1.26*  CALCIUM 9.2 8.5* 8.6*   Liver Function Tests: Recent Labs  Lab 12/27/19 1038  AST 22  ALT 59*  ALKPHOS 89  BILITOT 1.0  PROT 6.6  ALBUMIN 3.3*   CBC: Recent Labs  Lab 12/31/19 1019 01/01/20 0515  WBC 11.5* 11.3*  HGB 17.8* 16.1  HCT 53.2* 47.6  MCV 82.0 82.9  PLT 264 224   BNP (last 3 results) Recent Labs    09/22/19 0516 11/22/19 0343 12/20/19 0903  BNP 1,178.0* 1,698.0* 2,920.0*    CBG: Recent Labs  Lab 12/31/19 2103 01/01/20 0759 01/01/20 1155  GLUCAP 256* 161* 176*    Recent Results (from the past 240 hour(s))  SARS Coronavirus 2 by RT PCR (hospital order, performed in [facility] hospital lab) Nasopharyngeal Nasopharyngeal Swab     Status: None   Collection Time: 12/31/19  5:31  PM   Specimen: Nasopharyngeal Swab  Result Value Ref Range Status   SARS Coronavirus 2 NEGATIVE NEGATIVE Final    Comment: (NOTE) SARS-CoV-2 target nucleic acids are NOT DETECTED. The SARS-CoV-2 RNA is generally detectable in upper and lower respiratory specimens during the acute phase of infection. The lowest concentration of SARS-CoV-2 viral copies this assay can detect is 250 copies / mL. A negative result does not preclude SARS-CoV-2 infection and should not be used as the sole basis for treatment or other patient management decisions.  A negative result may occur with improper specimen collection / handling, submission of specimen other than  nasopharyngeal swab, presence of viral mutation(s) within the areas targeted by this assay, and inadequate number of viral copies (<250 copies / mL). A negative result must be combined with clinical observations, patient history, and epidemiological information. Fact Sheet for Patients:   StrictlyIdeas.no Fact Sheet for Healthcare Providers: BankingDealers.co.za This test is not yet approved or cleared  by the [place] FDA and has been authorized for detection and/or diagnosis of SARS-CoV-2 by FDA under an Emergency Use Authorization (EUA).  This EUA will remain in effect (meaning this test can be used) for the duration of the COVID-19 declaration under Section 564(b)(1) of the Act, 21 U.S.C. section 360bbb-3(b)(1), unless the authorization is terminated or revoked sooner. Performed at [facility], [address] [place], [place] [address]      Studies: DG Chest 2 View  Result Date: 12/31/2019 CLINICAL DATA:  Hemoptysis EXAM: CHEST - 2 VIEW COMPARISON:  Eleven days ago FINDINGS: No convincing infiltrate. Small area of increased density over the lower thoracic posterior elements is most likely from overlapping ribs. No air bronchograms, subpleural wedge of opacity, edema, effusion, or pneumothorax. Chronic cardiomegaly. Degenerative endplate spurring. IMPRESSION: Cardiomegaly without failure. Electronically Signed   By: Monte Fantasia M.D.   On: 12/31/2019 11:54   CT Angio Chest PE W and/or Wo Contrast  Result Date: 12/31/2019 CLINICAL DATA:  Shortness of breath, CHF, hemoptysis EXAM: CT ANGIOGRAPHY CHEST WITH CONTRAST TECHNIQUE: Multidetector CT imaging of the chest was performed using the standard protocol during bolus administration of intravenous contrast. Multiplanar CT image reconstructions and MIPs were obtained to evaluate the vascular anatomy. CONTRAST:  60mL OMNIPAQUE IOHEXOL 350 MG/ML SOLN COMPARISON:  09/30/2017  FINDINGS: Cardiovascular: Trace pericardial fluid. There is four-chamber cardiac enlargement. Punctate metallic densities or calcifications in the right ventricle as before, nonspecific. Incompletely occlusive pulmonary emboli in the distal right lower lobe pulmonary artery extending into segmental branches. Posterior right upper lobe segmental PE. No definite left PE. Scattered coronary calcifications. Incomplete opacification of the thoracic aorta, normal in caliber. Mediastinum/Nodes: Subcarinal nodes up to 1.5 cm short axis. Multiple prominent right paratracheal and prevascular lymph nodes. Lungs/Pleura: No pleural effusion. No pneumothorax. Peripheral airspace consolidation in posterior and medial basal segments right lower lobe. Platelike atelectasis laterally in the left upper lobe. Upper Abdomen: No acute findings. Musculoskeletal: Anterior vertebral endplate spurring at multiple levels in the lower thoracic spine. No fracture or worrisome bone lesion. Review of the MIP images confirms the above findings. IMPRESSION: 1. POSITIVE for right upper and lower lobe acute pulmonary emboli as above. 2. Right lower lobe airspace consolidation may represent pneumonia or pulmonary infarct. Critical Value/emergent results were called by telephone at the time of interpretation on 12/31/2019 at 3:59 pm to provider ED physician, who verbally acknowledged these results. 3. Nonspecific mediastinal adenopathy, possibly reactive. 4. Coronary calcifications. The severity of coronary artery disease and any  potential stenosis cannot be assessed on this non-gated CT examination. Electronically Signed   By: Lucrezia Europe M.D.   On: 12/31/2019 16:02   US Venous Img Lower Bilateral (DVT)  Result Date: 12/31/2019 CLINICAL DATA:  History of pulmonary embolus.  Shortness of breath. EXAM: BILATERAL LOWER EXTREMITY VENOUS DOPPLER ULTRASOUND TECHNIQUE: Gray-scale sonography with graded compression, as well as color Doppler and duplex  ultrasound were performed to evaluate the lower extremity deep venous systems from the level of the common femoral vein and including the common femoral, femoral, profunda femoral, popliteal and calf veins including the posterior tibial, peroneal and gastrocnemius veins when visible. The superficial great saphenous vein was also interrogated. Spectral Doppler was utilized to evaluate flow at rest and with distal augmentation maneuvers in the common femoral, femoral and popliteal veins. COMPARISON:  None. FINDINGS: RIGHT LOWER EXTREMITY Common Femoral Vein: Positive for thrombus which extends from the common femoral vein into the saphenofemoral junction all the down the greater saphenous vein to the knee. Saphenofemoral Junction: Positive for occlusive thrombus. Profunda Femoral Vein: No evidence of thrombus. Normal compressibility and flow on color Doppler imaging. Femoral Vein: No evidence of thrombus. Normal compressibility, respiratory phasicity and response to augmentation. Popliteal Vein: No evidence of thrombus. Normal compressibility, respiratory phasicity and response to augmentation. Calf Veins: No evidence of thrombus. Normal compressibility and flow on color Doppler imaging. Superficial Great Saphenous Vein: Occlusive thrombus is identified throughout the greater saphenous vein. Venous Reflux:  None. Other Findings:  None. LEFT LOWER EXTREMITY Common Femoral Vein: No evidence of thrombus. Normal compressibility, respiratory phasicity and response to augmentation. Saphenofemoral Junction: No evidence of thrombus. Normal compressibility and flow on color Doppler imaging. Profunda Femoral Vein: No evidence of thrombus. Normal compressibility and flow on color Doppler imaging. Femoral Vein: No evidence of thrombus. Normal compressibility, respiratory phasicity and response to augmentation. Popliteal Vein: No evidence of thrombus. Normal compressibility, respiratory phasicity and response to augmentation. Calf  Veins: No evidence of thrombus. Normal compressibility and flow on color Doppler imaging. Superficial Great Saphenous Vein: No evidence of thrombus. Normal compressibility. Venous Reflux:  None. Other Findings:  None. IMPRESSION: Positive for right lower extremity DVT involving the common femoral vein, saphenofemoral junction and greater saphenous vein. No evidence for left lower extremity DVT. Electronically Signed   By: Kerby Moors M.D.   On: 12/31/2019 18:42    Scheduled Meds: . benzonatate  200 mg Oral TID  . carvedilol  12.5 mg Oral BID WC  . insulin aspart  0-15 Units Subcutaneous TID WC  . insulin aspart  0-5 Units Subcutaneous QHS  . insulin aspart  3 Units Subcutaneous TID WC  . insulin detemir  15 Units Subcutaneous QHS  . levofloxacin  750 mg Oral Daily  . losartan  100 mg Oral Daily  . rosuvastatin  20 mg Oral q1800  . [START ON 01/02/2020] spironolactone  25 mg Oral BID  . [START ON 01/02/2020] torsemide  40 mg Oral BID   Continuous Infusions: . heparin 1,650 Units/hr (01/01/20 0442)    Assessment/Plan:  1. Pulmonary emboli in the right upper lobe and right lower lobe with likely pulmonary infarct in the right lower lobe versus pneumonia.  Patient also has hemoptysis.  I will continue heparin drip through today.  Continue to monitor amount of hemoptysis.  Patient states that he is coughing up a little less blood today than previously.  Potential switch over to oral Eliquis tomorrow.  Will need a 30-day free card upon going home.  Patient on p.o. Levaquin.  But this is more likely pulmonary infarct. 2. Chronic systolic congestive heart failure.  Decreased dose of Coreg to 12.5 mg twice daily continue losartan, spironolactone and torsemide.  With blood pressure being low I will hold the spironolactone and torsemide this evening. 3. Essential hypertension with blood pressure on the lower side.  Discontinue clonidine.  Decrease dose of Coreg.  Hold torsemide and Aldactone this  evening. 4. Type 2 diabetes mellitus with hyperlipidemia on Levemir insulin and sliding scale.  Continue Crestor 5. Gout as needed colchicine.    Code Status:     Code Status Orders  (From admission, onward)         Start     Ordered   12/31/19 1710  Full code  Continuous     12/31/19 1710        Code Status History    Date Active Date Inactive Code Status Order ID Comments User Context   12/20/2019 1217 12/23/2019 2003 Full Code [id number]  Collier Bullock, MD ED   11/22/2019 0619 11/24/2019 1834 Full Code [id number]  Athena Masse, MD ED   09/22/2019 1026 09/23/2019 1703 Full Code [id number]  Vashti Hey, MD Inpatient   06/26/2019 0038 06/27/2019 1729 Full Code [id number]  Mansy, Arvella Merles, MD ED   10/01/2017 0027 10/01/2017 1506 Full Code [id number]  Lance Coon, MD Inpatient   08/21/2017 1350 08/24/2017 1615 Full Code [id number]  Gladstone Lighter, MD Inpatient   04/28/2017 1653 04/29/2017 1755 Full Code [id number]  Gladstone Lighter, MD Inpatient   01/05/2017 1555 01/07/2017 1643 Full Code [id number]  Nicholes Mango, MD ED   11/28/2016 1342 11/29/2016 2259 Full Code [id number]  Demetrios Loll, MD Inpatient   10/28/2016 1358 10/30/2016 1354 Full Code [id number]  Hillary Bow, MD ED   03/15/2016 0758 03/16/2016 1508 Full Code [id number]  Harrie Foreman, MD Inpatient   02/16/2016 0224 02/17/2016 1302 Full Code [id number]  Harrie Foreman, MD Inpatient   01/22/2016 2351 01/23/2016 1641 Full Code [id number]  Demetrios Loll, MD ED   01/03/2016 1422 01/09/2016 1454 Full Code [id number]  Lavetta Nielsen, Aaron Mose, MD ED   05/17/2015 2341 05/19/2015 1731 Full Code [id number]  Lance Coon, MD Inpatient   03/20/2015 0826 03/21/2015 1527 Full Code [id number]  Harrie Foreman, MD Inpatient   Advance Care Planning Activity     Family Communication: Patient refused Disposition Plan: Status is: Inpatient  Dispo: The patient is from: Home              Anticipated d/c is to: Home              Anticipated d/c date is: Potential  01/02/2020              Patient currently requires monitoring of hemoptysis with pulmonary embolism on iv blood thinner at this point.  I will reassess tomorrow on whether I can start the oral blood thinner and discharge.  Antibiotics:  Levaquin  Time spent: 27 minutes  Adele Milson [employer]

## 2020-01-01 NOTE — Consult Note (Signed)
ANTICOAGULATION CONSULT NOTE -  Pharmacy Consult for Heparin Infusion Indication: VTE Treatment  Allergies  Allergen Reactions  . Metformin And Related Nausea And Vomiting  . Prednisone Other (See Comments)    Reaction: Hallucinations    . Zantac [Ranitidine Hcl] Diarrhea and Nausea Only    Night sweats   Patient Measurements: Height: 6' (182.9 cm) Weight: 105.4 kg (232 lb 4.8 oz) IBW/kg (Calculated) : 77.6 Heparin Dosing Weight: 99.1 kg  Vital Signs: Temp: 97.5 F (36.4 C) (05/15 0359) Temp Source: Oral (05/15 0359) BP: 107/77 (05/15 0359) Pulse Rate: 62 (05/15 0359)  Labs: Recent Labs    12/31/19 1019 12/31/19 1634 12/31/19 2319 01/01/20 0515  HGB 17.8*  --   --  16.1  HCT 53.2*  --   --  47.6  PLT 264  --   --  224  APTT  --  26  --   --   LABPROT  --  12.8  --   --   INR  --  1.0  --   --   HEPARINUNFRC  --   --  0.53 0.59  CREATININE 1.30*  --   --  1.26*    Estimated Creatinine Clearance: 88 mL/min (A) (by C-G formula based on SCr of 1.26 mg/dL (H)).  Medical History: Past Medical History:  Diagnosis Date  . Chest wall pain, chronic   . Chronic systolic CHF (congestive heart failure) ([place])    a. 03/2015 Echo: EF 45-50%; b. 12/2015 Echo: EF 20-25%; c. 02/2016 Echo: EF 30-35%; d. 11/2016 Echo: EF 40-45%; e. 06/2019 Echo: EF 30-35%.  . Chronic Troponin Elevation   . CKD (chronic kidney disease), stage III   . COPD (chronic obstructive pulmonary disease) ([place])   . Diabetes mellitus without complication ([place])   . Hypertension    Resolved since weight loss  . NICM (nonischemic cardiomyopathy) ([place])    a. 03/2015 Echo: EF 45-50%; b. 04/2015 MV: small defect of mild severity in apex 2/2 apical thinning, EF 30-44%;  c. 12/2015 Echo: EF 20-25%; d. 02/2016 Echo: EF 30-35%; e. 11/2016 Echo: EF 40-45%, diff HK, mildly dil Ao root, mild MR, mod dil LA;  f. 12/2016 Cath: LM nl, LAD/Diags/LCX/OMs min irregs, RCA 40p/m/d; g. 06/2019 Echo: EF 30-35%, glob HK. Mod dil LA. RVSP  58.4 mmHg.  . Non-obstructive CAD (coronary artery disease)    a. 04/2015 low risk MV;  b. 12/2016 Cath: minor irregs in LAD/Diag/LCX/OM, RCA 40p/m/d.  [name] (nonsustained ventricular tachycardia) ([place])    a. 12/2015 noted on tele-->amio;  b. 12/2015 Event monitor: no VT. Atach noted.  . Obesity (BMI 30.0-34.9)   . Psoriasis   . Renal insufficiency   . Syncope    a. 01/2016 - felt to be vasovagal.  . Tobacco abuse     Medications:  Medications Prior to Admission  Medication Sig Dispense Refill Last Dose  . aspirin 81 MG chewable tablet Chew 1 tablet (81 mg total) by mouth daily. Reported on 02/08/2016 90 tablet 3 12/31/2019 at Unknown time  . benzonatate (TESSALON) 200 MG capsule Take 1 capsule (200 mg total) by mouth 3 (three) times daily. 20 capsule 0 Unknown at PRN  . carvedilol (COREG) 25 MG tablet Take 1 tablet (25 mg total) by mouth 2 (two) times daily with a meal. 60 tablet 0 12/31/2019 at 0800  . cloNIDine (CATAPRES) 0.1 MG tablet Take 1 tablet (0.1 mg total) by mouth 2 (two) times daily. (Patient taking differently: Take 0.05 mg by  mouth 2 (two) times daily. ) 180 tablet 3   . colchicine 0.6 MG tablet Take 1 tablet (0.6 mg total) by mouth daily. Take 1 tab daily for at least 6 more days. If  Symptoms persist past 6 days continue to use until prescription is finished 20 tablet 0 Unknown at PRN  . losartan (COZAAR) 100 MG tablet Take 1 tablet (100 mg total) by mouth daily. 90 tablet 3 12/31/2019 at Unknown time  . nitroGLYCERIN (NITROSTAT) 0.4 MG SL tablet Place 1 tablet (0.4 mg total) under the tongue every 5 (five) minutes x 3 doses as needed for chest pain. 30 tablet 0 Unknown at PRN  . rosuvastatin (CRESTOR) 20 MG tablet Take 1 tablet (20 mg total) by mouth daily. (Patient taking differently: Take 20 mg by mouth at bedtime. ) 90 tablet 3 12/30/2019 at 2000  . spironolactone (ALDACTONE) 25 MG tablet Take 1 tablet (25 mg total) by mouth 2 (two) times daily. 180 tablet 3 12/31/2019 at 0800  .  torsemide (DEMADEX) 20 MG tablet Take 2 tablets (40 mg total) by mouth 2 (two) times daily. 360 tablet 3 12/31/2019 at 0800   Scheduled:  . benzonatate  200 mg Oral TID  . carvedilol  25 mg Oral BID WC  . cloNIDine  0.1 mg Oral BID  . insulin aspart  0-15 Units Subcutaneous TID WC  . insulin aspart  0-5 Units Subcutaneous QHS  . insulin aspart  3 Units Subcutaneous TID WC  . insulin detemir  15 Units Subcutaneous QHS  . levofloxacin  750 mg Oral Daily  . losartan  100 mg Oral Daily  . rosuvastatin  20 mg Oral q1800  . spironolactone  25 mg Oral BID  . torsemide  40 mg Oral BID   Infusions:  . heparin 1,650 Units/hr (01/01/20 0442)   PRN: acetaminophen **OR** acetaminophen, chlorpheniramine-HYDROcodone, colchicine, nitroGLYCERIN, ondansetron **OR** ondansetron (ZOFRAN) IV, oxyCODONE, phenol Anti-infectives (From admission, onward)   Start     Dose/Rate Route Frequency Ordered Stop   12/31/19 1745  levofloxacin (LEVAQUIN) tablet 750 mg     750 mg Oral Daily 12/31/19 1734 01/05/20 0959     Assessment: Pharmacy has been consulted to initiate heparin infusion on 48yo with ongoing shortness of breath for a few days and coughing up blood. Takes daily ASA 81mg  only, no history of PTA anticoagulant use. CT of chest was positive for right upper and lower lobe acute pulmonary emboli and found right lower lobe airspace consolidation which may represent pneumonia or pulmonary infarct. Baseline labs have been ordered and are pending.   Goal of Therapy:  Heparin level 0.3-0.7 units/ml Monitor platelets by anticoagulation protocol: Yes   Plan:  Give 6000 units bolus x 1 Start heparin infusion at 1650 units/hr Check anti-Xa level in 6 hours and daily while on heparin Continue to monitor H&H and platelets  0514 2319 HL 0.53, therapeutic x 1, will continue heparin at current rate and recheck HL in am to confirm. 0515 0515 HL 0.59, therapeutic x 2, will continue heparin at current rate and recheck  HL & CBC daily  Hart Robinsons A 01/01/2020,6:15 AM

## 2020-01-01 NOTE — Plan of Care (Signed)

## 2020-01-02 LAB — CBC
HCT: 47.9 % (ref 39.0–52.0)
Hemoglobin: 15.5 g/dL (ref 13.0–17.0)
MCH: 27.4 pg (ref 26.0–34.0)
MCHC: 32.4 g/dL (ref 30.0–36.0)
MCV: 84.8 fL (ref 80.0–100.0)
Platelets: 220 10*3/uL (ref 150–400)
RBC: 5.65 MIL/uL (ref 4.22–5.81)
RDW: 14 % (ref 11.5–15.5)
WBC: 11.6 10*3/uL — ABNORMAL HIGH (ref 4.0–10.5)
nRBC: 0 % (ref 0.0–0.2)

## 2020-01-02 LAB — BASIC METABOLIC PANEL
Anion gap: 11 (ref 5–15)
BUN: 40 mg/dL — ABNORMAL HIGH (ref 6–20)
CO2: 29 mmol/L (ref 22–32)
Calcium: 9.3 mg/dL (ref 8.9–10.3)
Chloride: 98 mmol/L (ref 98–111)
Creatinine, Ser: 1.27 mg/dL — ABNORMAL HIGH (ref 0.61–1.24)
GFR calc Af Amer: >60 mL/min (ref >60)
GFR calc non Af Amer: >60 mL/min (ref >60)
Glucose, Bld: 203 mg/dL — ABNORMAL HIGH (ref 70–99)
Potassium: 4.3 mmol/L (ref 3.5–5.1)
Sodium: 138 mmol/L (ref 135–145)

## 2020-01-02 LAB — HEPARIN LEVEL (UNFRACTIONATED): Heparin Unfractionated: 0.5 IU/mL (ref 0.30–0.70)

## 2020-01-02 LAB — GLUCOSE, CAPILLARY: Glucose-Capillary: 211 mg/dL — ABNORMAL HIGH (ref 70–99)

## 2020-01-02 MED ORDER — INSULIN LISPRO (1 UNIT DIAL) 100 UNIT/ML (KWIKPEN)
3.0000 [IU] | PEN_INJECTOR | Freq: Three times a day (TID) | SUBCUTANEOUS | 0 refills | Status: DC
Start: 2020-01-02 — End: 2020-03-02

## 2020-01-02 MED ORDER — IPRATROPIUM-ALBUTEROL 20-100 MCG/ACT IN AERS: 1.0000 | INHALATION_SPRAY | Freq: Four times a day (QID) | RESPIRATORY_TRACT | Status: DC

## 2020-01-02 MED ORDER — IPRATROPIUM-ALBUTEROL 20-100 MCG/ACT IN AERS
1.0000 | INHALATION_SPRAY | Freq: Four times a day (QID) | RESPIRATORY_TRACT | Status: DC
  Administered 2020-01-02: 1 via RESPIRATORY_TRACT
  Filled 2020-01-02: qty 4

## 2020-01-02 MED ORDER — APIXABAN 5 MG PO TABS
10.0000 mg | ORAL_TABLET | Freq: Two times a day (BID) | ORAL | Status: DC
  Administered 2020-01-02: 10 mg via ORAL
  Filled 2020-01-02: qty 2

## 2020-01-02 MED ORDER — CARVEDILOL 25 MG PO TABS: 25.0000 mg | ORAL_TABLET | Freq: Two times a day (BID) | ORAL | Status: DC

## 2020-01-02 MED ORDER — IPRATROPIUM-ALBUTEROL 0.5-2.5 (3) MG/3ML IN SOLN: 3.0000 mL | Freq: Four times a day (QID) | RESPIRATORY_TRACT | Status: DC

## 2020-01-02 MED ORDER — SPIRONOLACTONE 25 MG PO TABS: 25.0000 mg | ORAL_TABLET | Freq: Two times a day (BID) | ORAL | 0 refills | Status: DC

## 2020-01-02 MED ORDER — BASAGLAR KWIKPEN 100 UNIT/ML ~~LOC~~ SOPN
24.0000 [IU] | PEN_INJECTOR | Freq: Every evening | SUBCUTANEOUS | 0 refills | Status: DC
Start: 2020-01-02 — End: 2020-03-16

## 2020-01-02 MED ORDER — IPRATROPIUM-ALBUTEROL 20-100 MCG/ACT IN AERS: 1.0000 | INHALATION_SPRAY | Freq: Four times a day (QID) | RESPIRATORY_TRACT | 0 refills | Status: DC

## 2020-01-02 MED ORDER — INSULIN PEN NEEDLE 29G X 5MM MISC: 1.0000 | Freq: Three times a day (TID) | 0 refills | Status: DC

## 2020-01-02 MED ORDER — COLCHICINE 0.6 MG PO TABS
0.6000 mg | ORAL_TABLET | Freq: Every day | ORAL | 0 refills | Status: DC | PRN
Start: 2020-01-02 — End: 2020-03-02

## 2020-01-02 MED ORDER — APIXABAN 5 MG PO TABS: ORAL_TABLET | ORAL | 0 refills | Status: DC

## 2020-01-02 NOTE — Discharge Summary (Signed)
[facility] NAME: Gabriel [name]    MR#:  [id number]  DATE OF BIRTH:  1970/09/29  DATE OF ADMISSION:  12/31/2019 ADMITTING PHYSICIAN: Loletha Grayer, MD  DATE OF DISCHARGE: 01/02/2020 11:16 AM  PRIMARY CARE PHYSICIAN: Dr Esmond Plants   ADMISSION DIAGNOSIS:  Pulmonary embolism (HCC) [I26.99] Hemoptysis [R04.2] Multiple subsegmental pulmonary emboli without acute cor pulmonale (HCC) [I26.94]  DISCHARGE DIAGNOSIS:  Active Problems:   Chronic systolic CHF (congestive heart failure) (HCC)   Type 2 diabetes mellitus with hyperlipidemia (HCC)   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HCC)   Multiple subsegmental pulmonary emboli without acute cor pulmonale ([place])   SECONDARY DIAGNOSIS:   Past Medical History:  Diagnosis Date  . Chest wall pain, chronic   . Chronic systolic CHF (congestive heart failure) ([place])    a. 03/2015 Echo: EF 45-50%; b. 12/2015 Echo: EF 20-25%; c. 02/2016 Echo: EF 30-35%; d. 11/2016 Echo: EF 40-45%; e. 06/2019 Echo: EF 30-35%.  . Chronic Troponin Elevation   . CKD (chronic kidney disease), stage III   . COPD (chronic obstructive pulmonary disease) ([place])   . Diabetes mellitus without complication ([place])   . Hypertension    Resolved since weight loss  . NICM (nonischemic cardiomyopathy) ([place])    a. 03/2015 Echo: EF 45-50%; b. 04/2015 MV: small defect of mild severity in apex 2/2 apical thinning, EF 30-44%;  c. 12/2015 Echo: EF 20-25%; d. 02/2016 Echo: EF 30-35%; e. 11/2016 Echo: EF 40-45%, diff HK, mildly dil Ao root, mild MR, mod dil LA;  f. 12/2016 Cath: LM nl, LAD/Diags/LCX/OMs min irregs, RCA 40p/m/d; g. 06/2019 Echo: EF 30-35%, glob HK. Mod dil LA. RVSP 58.4 mmHg.  . Non-obstructive CAD (coronary artery disease)    a. 04/2015 low risk MV;  b. 12/2016 Cath: minor irregs in LAD/Diag/LCX/OM, RCA 40p/m/d.  [name] (nonsustained ventricular tachycardia) ([place])    a. 12/2015 noted on tele-->amio;  b. 12/2015 Event monitor: no VT. Atach noted.  .  Obesity (BMI 30.0-34.9)   . Psoriasis   . Renal insufficiency   . Syncope    a. 01/2016 - felt to be vasovagal.  . Tobacco abuse     HOSPITAL COURSE:   1.  Acute pulmonary emboli in the right upper lobe and right lower lobe with pulmonary infarct in the right lower lobe.  Patient also had hemoptysis.  The patient was on heparin drip from admission and switched over to Eliquis upon discharge home.  Patient's hemoptysis has settled down.  He is breathing better and coughing less.  Told nursing staff that he needs a 30-day free Eliquis card prior to going home.  Patient was given antibiotics just in case it was pneumonia but I stopped them upon discharge because I do think this is pulmonary infarct.  Case discussed with [name] and he will likely have to switch over to Coumadin as outpatient and follow-up with the anticoagulation clinic in his office.  Prior to coming off anticoagulation would check another CT scan of the chest and right lower extremity sonogram.  Can consider hypercoagulable testing as outpatient once off anticoagulation. 2.  Chronic systolic congestive heart failure.  Blood pressure was a little low last night and he felt dizzy.  I held his clonidine.  Continue Coreg 25 mg twice a day, losartan, spironolactone and torsemide. 3.  Essential hypertension.  Blood pressure came up after holding medications yesterday evening. 4.  Type 2 diabetes mellitus with hyperlipidemia.  E-prescribed Basaglar insulin and Humulin  short acting prior to meals.  Continue Crestor 5.  As needed colchicine 6.  Slight wheeze.  Combivent inhaler given in the hospital for him to take home and prescribed into pharmacy.  DISCHARGE CONDITIONS:   Satisfactory  CONSULTS OBTAINED:  None  DRUG ALLERGIES:   Allergies  Allergen Reactions  . Metformin And Related Nausea And Vomiting  . Prednisone Other (See Comments)    Reaction: Hallucinations    . Zantac [Ranitidine Hcl] Diarrhea and Nausea Only     Night sweats    DISCHARGE MEDICATIONS:   Allergies as of 01/02/2020      Reactions   Metformin And Related Nausea And Vomiting   Prednisone Other (See Comments)   Reaction: Hallucinations    Zantac [ranitidine Hcl] Diarrhea, Nausea Only   Night sweats      Medication List    STOP taking these medications   aspirin 81 MG chewable tablet   cloNIDine 0.1 MG tablet Commonly known as: Catapres     TAKE these medications   apixaban 5 MG Tabs tablet Commonly known as: ELIQUIS Two tablets (10mg ) po twice a day for seven days then 1 tablet twice a day afterwards   Basaglar KwikPen 100 UNIT/ML Inject 0.24 mLs (24 Units total) into the skin at bedtime.   benzonatate 200 MG capsule Commonly known as: TESSALON Take 1 capsule (200 mg total) by mouth 3 (three) times daily.   carvedilol 25 MG tablet Commonly known as: COREG Take 1 tablet (25 mg total) by mouth 2 (two) times daily with a meal.   colchicine 0.6 MG tablet Take 1 tablet (0.6 mg total) by mouth daily as needed. Take 1 tab daily for at least 6 more days. If  Symptoms persist past 6 days continue to use until prescription is finished What changed:   when to take this  reasons to take this   insulin lispro 100 UNIT/ML KwikPen Commonly known as: HUMALOG Inject 0.03 mLs (3 Units total) into the skin 3 (three) times daily before meals.   Insulin Pen Needle 29G X 5MM Misc 1 Dose by Does not apply route 4 (four) times daily -  with meals and at bedtime.   Ipratropium-Albuterol 20-100 MCG/ACT Aers respimat Commonly known as: COMBIVENT Inhale 1 puff into the lungs every 6 (six) hours.   losartan 100 MG tablet Commonly known as: COZAAR Take 1 tablet (100 mg total) by mouth daily.   nitroGLYCERIN 0.4 MG SL tablet Commonly known as: NITROSTAT Place 1 tablet (0.4 mg total) under the tongue every 5 (five) minutes x 3 doses as needed for chest pain.   rosuvastatin 20 MG tablet Commonly known as: CRESTOR Take 1 tablet  (20 mg total) by mouth daily. What changed: when to take this   spironolactone 25 MG tablet Commonly known as: ALDACTONE Take 1 tablet (25 mg total) by mouth 2 (two) times daily.   torsemide 20 MG tablet Commonly known as: DEMADEX Take 2 tablets (40 mg total) by mouth 2 (two) times daily.        DISCHARGE INSTRUCTIONS:   Follow-up with [name] cardiology 1 week  If you experience worsening of your admission symptoms, develop shortness of breath, life threatening emergency, suicidal or homicidal thoughts you must seek medical attention immediately by calling 911 or calling your MD immediately  if symptoms less severe.  You Must read complete instructions/literature along with all the possible adverse reactions/side effects for all the Medicines you take and that have been prescribed to you.  Take any new Medicines after you have completely understood and accept all the possible adverse reactions/side effects.   Please note  You were cared for by a hospitalist during your hospital stay. If you have any questions about your discharge medications or the care you received while you were in the hospital after you are discharged, you can call the unit and asked to speak with the hospitalist on call if the hospitalist that took care of you is not available. Once you are discharged, your primary care physician will handle any further medical issues. Please note that NO REFILLS for any discharge medications will be authorized once you are discharged, as it is imperative that you return to your primary care physician (or establish a relationship with a primary care physician if you do not have one) for your aftercare needs so that they can reassess your need for medications and monitor your lab values.    Today   CHIEF COMPLAINT:   Chief Complaint  Patient presents with  . Hemoptysis    HISTORY OF PRESENT ILLNESS:  Gabriel [name]  is a 49 y.o. male came in with hemoptysis   VITAL  SIGNS:  Blood pressure 123/69, pulse 63, temperature 98.2 F (36.8 C), temperature source Oral, resp. rate 20, height 6' (1.829 m), weight 105.1 kg, SpO2 98 %.   PHYSICAL EXAMINATION:  GENERAL:  49 y.o.-year-old patient lying in the bed with no acute distress.  EYES: Pupils equal, round, reactive to light and accommodation. No scleral icterus. Extraocular muscles intact.  HEENT: Head atraumatic, normocephalic. Oropharynx and nasopharynx clear.   LUNGS: Normal breath sounds bilaterally, slight upper airway expiratory wheeze. No use of accessory muscles of respiration.  CARDIOVASCULAR: S1, S2 normal. No murmurs, rubs, or gallops.  ABDOMEN: Soft, non-tender, non-distended. Bowel sounds present. No organomegaly or mass.  EXTREMITIES: No pedal edema, cyanosis, or clubbing.  NEUROLOGIC: Cranial nerves II through XII are intact. Muscle strength 5/5 in all extremities. Sensation intact. Gait not checked.  PSYCHIATRIC: The patient is alert and oriented x 3.  SKIN: No obvious rash, lesion, or ulcer.   DATA REVIEW:   CBC Recent Labs  Lab 01/02/20 0601  WBC 11.6*  HGB 15.5  HCT 47.9  PLT 220    Chemistries  Recent Labs  Lab 12/27/19 1038 12/31/19 1019 01/02/20 0601  NA 136   < > 138  K 4.9   < > 4.3  CL 94*   < > 98  CO2 25   < > 29  GLUCOSE 339*   < > 203*  BUN 31*   < > 40*  CREATININE 1.31*   < > 1.27*  CALCIUM 9.2   < > 9.3  AST 22  --   --   ALT 59*  --   --   ALKPHOS 89  --   --   BILITOT 1.0  --   --    < > = values in this interval not displayed.    Microbiology Results  Results for orders placed or performed during the hospital encounter of 12/31/19  SARS Coronavirus 2 by RT PCR (hospital order, performed in [facility] hospital lab) Nasopharyngeal Nasopharyngeal Swab     Status: None   Collection Time: 12/31/19  5:31 PM   Specimen: Nasopharyngeal Swab  Result Value Ref Range Status   SARS Coronavirus 2 NEGATIVE NEGATIVE Final    Comment: (NOTE) SARS-CoV-2  target nucleic acids are NOT DETECTED. The SARS-CoV-2 RNA is generally detectable in upper and  lower respiratory specimens during the acute phase of infection. The lowest concentration of SARS-CoV-2 viral copies this assay can detect is 250 copies / mL. A negative result does not preclude SARS-CoV-2 infection and should not be used as the sole basis for treatment or other patient management decisions.  A negative result may occur with improper specimen collection / handling, submission of specimen other than nasopharyngeal swab, presence of viral mutation(s) within the areas targeted by this assay, and inadequate number of viral copies (<250 copies / mL). A negative result must be combined with clinical observations, patient history, and epidemiological information. Fact Sheet for Patients:   StrictlyIdeas.no Fact Sheet for Healthcare Providers: BankingDealers.co.za This test is not yet approved or cleared  by the [place] FDA and has been authorized for detection and/or diagnosis of SARS-CoV-2 by FDA under an Emergency Use Authorization (EUA).  This EUA will remain in effect (meaning this test can be used) for the duration of the COVID-19 declaration under Section 564(b)(1) of the Act, 21 U.S.C. section 360bbb-3(b)(1), unless the authorization is terminated or revoked sooner. Performed at [facility], [address] [place], [place] [address]     RADIOLOGY:  CT Angio Chest PE W and/or Wo Contrast  Result Date: 12/31/2019 CLINICAL DATA:  Shortness of breath, CHF, hemoptysis EXAM: CT ANGIOGRAPHY CHEST WITH CONTRAST TECHNIQUE: Multidetector CT imaging of the chest was performed using the standard protocol during bolus administration of intravenous contrast. Multiplanar CT image reconstructions and MIPs were obtained to evaluate the vascular anatomy. CONTRAST:  25mL OMNIPAQUE IOHEXOL 350 MG/ML SOLN COMPARISON:  09/30/2017  FINDINGS: Cardiovascular: Trace pericardial fluid. There is four-chamber cardiac enlargement. Punctate metallic densities or calcifications in the right ventricle as before, nonspecific. Incompletely occlusive pulmonary emboli in the distal right lower lobe pulmonary artery extending into segmental branches. Posterior right upper lobe segmental PE. No definite left PE. Scattered coronary calcifications. Incomplete opacification of the thoracic aorta, normal in caliber. Mediastinum/Nodes: Subcarinal nodes up to 1.5 cm short axis. Multiple prominent right paratracheal and prevascular lymph nodes. Lungs/Pleura: No pleural effusion. No pneumothorax. Peripheral airspace consolidation in posterior and medial basal segments right lower lobe. Platelike atelectasis laterally in the left upper lobe. Upper Abdomen: No acute findings. Musculoskeletal: Anterior vertebral endplate spurring at multiple levels in the lower thoracic spine. No fracture or worrisome bone lesion. Review of the MIP images confirms the above findings. IMPRESSION: 1. POSITIVE for right upper and lower lobe acute pulmonary emboli as above. 2. Right lower lobe airspace consolidation may represent pneumonia or pulmonary infarct. Critical Value/emergent results were called by telephone at the time of interpretation on 12/31/2019 at 3:59 pm to provider ED physician, who verbally acknowledged these results. 3. Nonspecific mediastinal adenopathy, possibly reactive. 4. Coronary calcifications. The severity of coronary artery disease and any potential stenosis cannot be assessed on this non-gated CT examination. Electronically Signed   By: Lucrezia Europe M.D.   On: 12/31/2019 16:02   US Venous Img Lower Bilateral (DVT)  Result Date: 12/31/2019 CLINICAL DATA:  History of pulmonary embolus.  Shortness of breath. EXAM: BILATERAL LOWER EXTREMITY VENOUS DOPPLER ULTRASOUND TECHNIQUE: Gray-scale sonography with graded compression, as well as color Doppler and duplex  ultrasound were performed to evaluate the lower extremity deep venous systems from the level of the common femoral vein and including the common femoral, femoral, profunda femoral, popliteal and calf veins including the posterior tibial, peroneal and gastrocnemius veins when visible. The superficial great saphenous vein was also interrogated. Spectral Doppler was utilized  to evaluate flow at rest and with distal augmentation maneuvers in the common femoral, femoral and popliteal veins. COMPARISON:  None. FINDINGS: RIGHT LOWER EXTREMITY Common Femoral Vein: Positive for thrombus which extends from the common femoral vein into the saphenofemoral junction all the down the greater saphenous vein to the knee. Saphenofemoral Junction: Positive for occlusive thrombus. Profunda Femoral Vein: No evidence of thrombus. Normal compressibility and flow on color Doppler imaging. Femoral Vein: No evidence of thrombus. Normal compressibility, respiratory phasicity and response to augmentation. Popliteal Vein: No evidence of thrombus. Normal compressibility, respiratory phasicity and response to augmentation. Calf Veins: No evidence of thrombus. Normal compressibility and flow on color Doppler imaging. Superficial Great Saphenous Vein: Occlusive thrombus is identified throughout the greater saphenous vein. Venous Reflux:  None. Other Findings:  None. LEFT LOWER EXTREMITY Common Femoral Vein: No evidence of thrombus. Normal compressibility, respiratory phasicity and response to augmentation. Saphenofemoral Junction: No evidence of thrombus. Normal compressibility and flow on color Doppler imaging. Profunda Femoral Vein: No evidence of thrombus. Normal compressibility and flow on color Doppler imaging. Femoral Vein: No evidence of thrombus. Normal compressibility, respiratory phasicity and response to augmentation. Popliteal Vein: No evidence of thrombus. Normal compressibility, respiratory phasicity and response to augmentation. Calf  Veins: No evidence of thrombus. Normal compressibility and flow on color Doppler imaging. Superficial Great Saphenous Vein: No evidence of thrombus. Normal compressibility. Venous Reflux:  None. Other Findings:  None. IMPRESSION: Positive for right lower extremity DVT involving the common femoral vein, saphenofemoral junction and greater saphenous vein. No evidence for left lower extremity DVT. Electronically Signed   By: Kerby Moors M.D.   On: 12/31/2019 18:42    Management plans discussed with the patient, and he is in agreement.  CODE STATUS:     Code Status Orders  (From admission, onward)         Start     Ordered   12/31/19 1710  Full code  Continuous     12/31/19 1710        Code Status History    Date Active Date Inactive Code Status Order ID Comments User Context   12/20/2019 1217 12/23/2019 2003 Full Code [id number]  Collier Bullock, MD ED   11/22/2019 0619 11/24/2019 1834 Full Code [id number]  Athena Masse, MD ED   09/22/2019 1026 09/23/2019 1703 Full Code [id number]  Vashti Hey, MD Inpatient   06/26/2019 0038 06/27/2019 1729 Full Code [id number]  Mansy, Arvella Merles, MD ED   10/01/2017 0027 10/01/2017 1506 Full Code [id number]  Lance Coon, MD Inpatient   08/21/2017 1350 08/24/2017 1615 Full Code [id number]  Gladstone Lighter, MD Inpatient   04/28/2017 1653 04/29/2017 1755 Full Code [id number]  Gladstone Lighter, MD Inpatient   01/05/2017 1555 01/07/2017 1643 Full Code [id number]  Nicholes Mango, MD ED   11/28/2016 1342 11/29/2016 2259 Full Code [id number]  Demetrios Loll, MD Inpatient   10/28/2016 1358 10/30/2016 1354 Full Code [id number]  Hillary Bow, MD ED   03/15/2016 0758 03/16/2016 1508 Full Code [id number]  Harrie Foreman, MD Inpatient   02/16/2016 0224 02/17/2016 1302 Full Code [id number]  Harrie Foreman, MD Inpatient   01/22/2016 2351 01/23/2016 1641 Full Code [id number]  Demetrios Loll, MD ED   01/03/2016 1422 01/09/2016 1454 Full Code [id number]  Lavetta Nielsen, Aaron Mose, MD ED   05/17/2015 2341  05/19/2015 1731 Full Code [id number]  Lance Coon, MD Inpatient   03/20/2015 0826 03/21/2015 1527 Full Code [id number]  Harrie Foreman, MD Inpatient   Advance Care  Planning Activity      TOTAL TIME TAKING CARE OF THIS PATIENT: 35 minutes.    Loletha Grayer M.D on 01/02/2020 at 1:45 PM  Between 7am to 6pm - Pager - [phone]  After 6pm go to [email or web address] - password [email or web address]  [facility]  CC: Primary care physician; Dr Esmond Plants

## 2020-01-02 NOTE — Consult Note (Signed)
ANTICOAGULATION CONSULT NOTE -  Pharmacy Consult for Heparin Infusion Indication: VTE Treatment  Allergies  Allergen Reactions  . Metformin And Related Nausea And Vomiting  . Prednisone Other (See Comments)    Reaction: Hallucinations    . Zantac [Ranitidine Hcl] Diarrhea and Nausea Only    Night sweats   Patient Measurements: Height: 6' (182.9 cm) Weight: 105.1 kg (231 lb 12.8 oz) IBW/kg (Calculated) : 77.6 Heparin Dosing Weight: 99.1 kg  Vital Signs: Temp: 98 F (36.7 C) (05/16 0605) Temp Source: Oral (05/16 0605) BP: 141/95 (05/16 0605) Pulse Rate: 72 (05/16 0605)  Labs: Recent Labs    12/31/19 1019 12/31/19 1019 12/31/19 1634 12/31/19 2319 01/01/20 0515 01/02/20 0601  HGB 17.8*   < >  --   --  16.1 15.5  HCT 53.2*  --   --   --  47.6 47.9  PLT 264  --   --   --  224 220  APTT  --   --  26  --   --   --   LABPROT  --   --  12.8  --   --   --   INR  --   --  1.0  --   --   --   HEPARINUNFRC  --   --   --  0.53 0.59 0.50  CREATININE 1.30*  --   --   --  1.26*  --    < > = values in this interval not displayed.    Estimated Creatinine Clearance: 87.9 mL/min (A) (by C-G formula based on SCr of 1.26 mg/dL (H)).  Medical History: Past Medical History:  Diagnosis Date  . Chest wall pain, chronic   . Chronic systolic CHF (congestive heart failure) ([place])    a. 03/2015 Echo: EF 45-50%; b. 12/2015 Echo: EF 20-25%; c. 02/2016 Echo: EF 30-35%; d. 11/2016 Echo: EF 40-45%; e. 06/2019 Echo: EF 30-35%.  . Chronic Troponin Elevation   . CKD (chronic kidney disease), stage III   . COPD (chronic obstructive pulmonary disease) ([place])   . Diabetes mellitus without complication ([place])   . Hypertension    Resolved since weight loss  . NICM (nonischemic cardiomyopathy) ([place])    a. 03/2015 Echo: EF 45-50%; b. 04/2015 MV: small defect of mild severity in apex 2/2 apical thinning, EF 30-44%;  c. 12/2015 Echo: EF 20-25%; d. 02/2016 Echo: EF 30-35%; e. 11/2016 Echo: EF 40-45%, diff HK, mildly  dil Ao root, mild MR, mod dil LA;  f. 12/2016 Cath: LM nl, LAD/Diags/LCX/OMs min irregs, RCA 40p/m/d; g. 06/2019 Echo: EF 30-35%, glob HK. Mod dil LA. RVSP 58.4 mmHg.  . Non-obstructive CAD (coronary artery disease)    a. 04/2015 low risk MV;  b. 12/2016 Cath: minor irregs in LAD/Diag/LCX/OM, RCA 40p/m/d.  [name] (nonsustained ventricular tachycardia) ([place])    a. 12/2015 noted on tele-->amio;  b. 12/2015 Event monitor: no VT. Atach noted.  . Obesity (BMI 30.0-34.9)   . Psoriasis   . Renal insufficiency   . Syncope    a. 01/2016 - felt to be vasovagal.  . Tobacco abuse     Medications:  Medications Prior to Admission  Medication Sig Dispense Refill Last Dose  . aspirin 81 MG chewable tablet Chew 1 tablet (81 mg total) by mouth daily. Reported on 02/08/2016 90 tablet 3 12/31/2019 at Unknown time  . benzonatate (TESSALON) 200 MG capsule Take 1 capsule (200 mg total) by mouth 3 (three) times daily. 20 capsule 0 Unknown  at PRN  . carvedilol (COREG) 25 MG tablet Take 1 tablet (25 mg total) by mouth 2 (two) times daily with a meal. 60 tablet 0 12/31/2019 at 0800  . cloNIDine (CATAPRES) 0.1 MG tablet Take 1 tablet (0.1 mg total) by mouth 2 (two) times daily. (Patient taking differently: Take 0.05 mg by mouth 2 (two) times daily. ) 180 tablet 3   . colchicine 0.6 MG tablet Take 1 tablet (0.6 mg total) by mouth daily. Take 1 tab daily for at least 6 more days. If  Symptoms persist past 6 days continue to use until prescription is finished 20 tablet 0 Unknown at PRN  . losartan (COZAAR) 100 MG tablet Take 1 tablet (100 mg total) by mouth daily. 90 tablet 3 12/31/2019 at Unknown time  . nitroGLYCERIN (NITROSTAT) 0.4 MG SL tablet Place 1 tablet (0.4 mg total) under the tongue every 5 (five) minutes x 3 doses as needed for chest pain. 30 tablet 0 Unknown at PRN  . rosuvastatin (CRESTOR) 20 MG tablet Take 1 tablet (20 mg total) by mouth daily. (Patient taking differently: Take 20 mg by mouth at bedtime. ) 90 tablet 3  12/30/2019 at 2000  . spironolactone (ALDACTONE) 25 MG tablet Take 1 tablet (25 mg total) by mouth 2 (two) times daily. 180 tablet 3 12/31/2019 at 0800  . torsemide (DEMADEX) 20 MG tablet Take 2 tablets (40 mg total) by mouth 2 (two) times daily. 360 tablet 3 12/31/2019 at 0800   Scheduled:  . benzonatate  200 mg Oral TID  . carvedilol  12.5 mg Oral BID WC  . insulin aspart  0-15 Units Subcutaneous TID WC  . insulin aspart  0-5 Units Subcutaneous QHS  . insulin aspart  3 Units Subcutaneous TID WC  . insulin detemir  15 Units Subcutaneous QHS  . levofloxacin  750 mg Oral Daily  . losartan  100 mg Oral Daily  . rosuvastatin  20 mg Oral q1800  . spironolactone  25 mg Oral BID  . torsemide  40 mg Oral BID   Infusions:  . heparin 1,650 Units/hr (01/01/20 0442)   PRN: acetaminophen **OR** acetaminophen, chlorpheniramine-HYDROcodone, colchicine, nitroGLYCERIN, ondansetron **OR** ondansetron (ZOFRAN) IV, oxyCODONE, phenol Anti-infectives (From admission, onward)   Start     Dose/Rate Route Frequency Ordered Stop   12/31/19 1745  levofloxacin (LEVAQUIN) tablet 750 mg     750 mg Oral Daily 12/31/19 1734 01/05/20 0959     Assessment: Pharmacy has been consulted to initiate heparin infusion on 48yo with ongoing shortness of breath for a few days and coughing up blood. Takes daily ASA 81mg  only, no history of PTA anticoagulant use. CT of chest was positive for right upper and lower lobe acute pulmonary emboli and found right lower lobe airspace consolidation which may represent pneumonia or pulmonary infarct. Baseline labs have been ordered and are pending.   Goal of Therapy:  Heparin level 0.3-0.7 units/ml Monitor platelets by anticoagulation protocol: Yes   Plan:  Give 6000 units bolus x 1 Start heparin infusion at 1650 units/hr Check anti-Xa level in 6 hours and daily while on heparin Continue to monitor H&H and platelets  0514 2319 HL 0.53, therapeutic x 1, will continue heparin at  current rate and recheck HL in am to confirm. 0515 0515 HL 0.59, therapeutic x 2, will continue heparin at current rate and recheck HL & CBC daily 0516 0601 HL 0.50, therapeutic x 3, CBC stable, will continue current rate and recheck HL & CBC daily  [place] Crane, Kimmora Risenhoover A 01/02/2020,6:49 AM

## 2020-01-03 ENCOUNTER — Telehealth: Payer: Self-pay | Admitting: Cardiovascular Disease

## 2020-01-03 DIAGNOSIS — I2699 Other pulmonary embolism without acute cor pulmonale: Secondary | ICD-10-CM | POA: Insufficient documentation

## 2020-01-03 NOTE — Telephone Encounter (Signed)
[name] will need to start him on warfarin and then I can see him for a new pt visit once he has been on it for 3-5 days.

## 2020-01-03 NOTE — Telephone Encounter (Signed)
[name] reviewed verbally that patient was admitted for PE and DVT. He has lost his job and was sent home on Eliquis. Patient has history of noncompliance because he is not able to afford his medications. [name] would like him to be set up with coumadin clinic and then follow up with [place] in the office. He also wanted me to forward to all parties that are involved with his care. Will enter order for coumadin referral, send to HF clinic per [name], to our scheduling department, and also over to medication management to update them as well.

## 2020-01-05 MED ORDER — LOSARTAN POTASSIUM 100 MG PO TABS
100.0000 mg | ORAL_TABLET | Freq: Every day | ORAL | 3 refills | Status: DC
Start: 2020-01-05 — End: 2020-01-18

## 2020-01-05 MED ORDER — APIXABAN 5 MG PO TABS
5.0000 mg | ORAL_TABLET | Freq: Two times a day (BID) | ORAL | 3 refills | Status: DC
Start: 2020-01-05 — End: 2020-06-09

## 2020-01-05 NOTE — Telephone Encounter (Signed)
Do you want me to start him on Warfarin 5 mg once daily after he finishes the samples he currently has?

## 2020-01-05 NOTE — Telephone Encounter (Signed)
Spoke with patient and he states that he was able to get [facility] to take care of his medications. Advised that I would cancel his upcoming appointment for coumadin clinic if he is able to get this medication. He did state he continues to cough up blood when he has coughing episodes. Advised I would check with provider and give him a call back. He verbalized understanding with no further questions at this time.

## 2020-01-05 NOTE — Telephone Encounter (Signed)
Called and spoke with patient regarding Eliquis and provider instructions to continue as directed. Patient verbalized understanding and will continue. I called [facility] to [facility] and left message with Anne Ng to review if he would in fact continue getting his Eliquis from them. Patient states that he did want me to make [name] aware he had to restart the clonidine 0.1 mg taking 1/2 pill twice a day because his blood pressures were remaining elevated. Let him know that I would make him aware and would be in touch with [facility] as well to follow up on them providing continued medications. He verbalized understanding of our conversation, agreement with plan, and had no further questions at this time.

## 2020-01-05 NOTE — Telephone Encounter (Signed)
Spoke with Anne Ng over at [facility] and he is going to qualify for Eliquis and Losartan to be provided through them.

## 2020-01-07 ENCOUNTER — Other Ambulatory Visit: Payer: Self-pay | Admitting: *Deleted

## 2020-01-07 NOTE — Telephone Encounter (Signed)
Referral canceled due to patient receiving assistance with his Eliquis.

## 2020-01-07 NOTE — Addendum Note (Signed)
Addended by: Valora Corporal on: 01/07/2020 09:29 AM   Modules accepted: Orders

## 2020-01-18 ENCOUNTER — Encounter (HOSPITAL_COMMUNITY): Payer: Self-pay | Admitting: Cardiology

## 2020-01-18 ENCOUNTER — Ambulatory Visit (HOSPITAL_COMMUNITY)
Admission: RE | Admit: 2020-01-18 | Discharge: 2020-01-18 | Disposition: A | Discharge status: Home / Self Care | Payer: Medicaid Other | Source: Ambulatory Visit | Attending: Cardiology | Admitting: Cardiology

## 2020-01-18 ENCOUNTER — Ambulatory Visit: Payer: Medicaid Other

## 2020-01-18 ENCOUNTER — Other Ambulatory Visit: Payer: Self-pay

## 2020-01-18 VITALS — BP 124/80 | HR 71 | Wt 235.2 lb

## 2020-01-18 DIAGNOSIS — J449 Chronic obstructive pulmonary disease, unspecified: Secondary | ICD-10-CM | POA: Insufficient documentation

## 2020-01-18 DIAGNOSIS — Z7901 Long term (current) use of anticoagulants: Secondary | ICD-10-CM | POA: Diagnosis not present

## 2020-01-18 DIAGNOSIS — I5022 Chronic systolic (congestive) heart failure: Secondary | ICD-10-CM

## 2020-01-18 DIAGNOSIS — Z8249 Family history of ischemic heart disease and other diseases of the circulatory system: Secondary | ICD-10-CM | POA: Diagnosis not present

## 2020-01-18 DIAGNOSIS — Z794 Long term (current) use of insulin: Secondary | ICD-10-CM | POA: Insufficient documentation

## 2020-01-18 DIAGNOSIS — E119 Type 2 diabetes mellitus without complications: Secondary | ICD-10-CM | POA: Insufficient documentation

## 2020-01-18 DIAGNOSIS — I110 Hypertensive heart disease with heart failure: Secondary | ICD-10-CM | POA: Diagnosis not present

## 2020-01-18 DIAGNOSIS — Z87891 Personal history of nicotine dependence: Secondary | ICD-10-CM | POA: Diagnosis not present

## 2020-01-18 DIAGNOSIS — I82401 Acute embolism and thrombosis of unspecified deep veins of right lower extremity: Secondary | ICD-10-CM | POA: Insufficient documentation

## 2020-01-18 DIAGNOSIS — G4733 Obstructive sleep apnea (adult) (pediatric): Secondary | ICD-10-CM | POA: Insufficient documentation

## 2020-01-18 DIAGNOSIS — I428 Other cardiomyopathies: Secondary | ICD-10-CM | POA: Insufficient documentation

## 2020-01-18 DIAGNOSIS — Z79899 Other long term (current) drug therapy: Secondary | ICD-10-CM | POA: Insufficient documentation

## 2020-01-18 DIAGNOSIS — I2699 Other pulmonary embolism without acute cor pulmonale: Secondary | ICD-10-CM

## 2020-01-18 LAB — BASIC METABOLIC PANEL
Anion gap: 12 (ref 5–15)
BUN: 41 mg/dL — ABNORMAL HIGH (ref 6–20)
CO2: 31 mmol/L (ref 22–32)
Calcium: 10.2 mg/dL (ref 8.9–10.3)
Chloride: 92 mmol/L — ABNORMAL LOW (ref 98–111)
Creatinine, Ser: 1.37 mg/dL — ABNORMAL HIGH (ref 0.61–1.24)
GFR calc Af Amer: >60 mL/min (ref >60)
GFR calc non Af Amer: 60 mL/min — ABNORMAL LOW (ref >60)
Glucose, Bld: 329 mg/dL — ABNORMAL HIGH (ref 70–99)
Potassium: 5.5 mmol/L — ABNORMAL HIGH (ref 3.5–5.1)
Sodium: 135 mmol/L (ref 135–145)

## 2020-01-18 LAB — LIPID PANEL
Cholesterol: 246 mg/dL — ABNORMAL HIGH (ref 0–200)
HDL: 27 mg/dL — ABNORMAL LOW (ref >40)
LDL Cholesterol: UNDETERMINED mg/dL (ref 0–99)
Total CHOL/HDL Ratio: 9.1 RATIO
Triglycerides: 797 mg/dL — ABNORMAL HIGH (ref <150)
VLDL: UNDETERMINED mg/dL (ref 0–40)

## 2020-01-18 LAB — LDL CHOLESTEROL, DIRECT: Direct LDL: 65.3 mg/dL (ref 0–99)

## 2020-01-18 MED ORDER — SACUBITRIL-VALSARTAN 49-51 MG PO TABS: 1.0000 | ORAL_TABLET | Freq: Two times a day (BID) | ORAL | 1 refills | Status: DC

## 2020-01-18 MED ORDER — SACUBITRIL-VALSARTAN 49-51 MG PO TABS: 1.0000 | ORAL_TABLET | Freq: Two times a day (BID) | ORAL | 0 refills | Status: DC

## 2020-01-18 MED ORDER — SACUBITRIL-VALSARTAN 49-51 MG PO TABS
1.0000 | ORAL_TABLET | Freq: Two times a day (BID) | ORAL | 1 refills | Status: DC
Start: 2020-01-18 — End: 2020-01-18

## 2020-01-18 NOTE — Patient Instructions (Addendum)
START Entresto 49/51mg  (1 tab) twice a day    STOP Aspirin   STOP Losartan (cozaar)    Labs today and repeat in 2 weeks in [place] We will only contact you if something comes back abnormal or we need to make some changes. Otherwise no news is good news!    You will get a call from the social worker to discuss getting insurance  Your physician recommends that you schedule a follow-up appointment in: 3 weeks with the pharmacist and 6 weeks with Dr Aundra Dubin  Please call office at [phone] option 2 if you have any questions or concerns.   At the [facility], you and your health needs are our priority. As part of our continuing mission to provide you with exceptional heart care, we have created designated Provider Care Teams. These Care Teams include your primary Cardiologist (physician) and Advanced Practice Providers (APPs- Physician Assistants and Nurse Practitioners) who all work together to provide you with the care you need, when you need it.   You may see any of the following providers on your designated Care Team at your next follow up: Marland Kitchen Dr Glori Bickers . Dr Loralie Champagne . Darrick Grinder, NP . Lyda Jester, PA . Audry Riles, PharmD   Please be sure to bring in all your medications bottles to every appointment.

## 2020-01-19 ENCOUNTER — Telehealth (HOSPITAL_COMMUNITY): Payer: Self-pay | Admitting: Pharmacy Technician

## 2020-01-19 NOTE — Progress Notes (Signed)
PCP: Patient, No Pcp Per Cardiology: [name] HF Cardiology: Dr. Aundra Dubin  49 y.o. with history of nonischemic cardiomyopathy, HTN, and recent DVT/PE was referred for evaluation of CHG by [name].  Patient has had a known nonischemic cardiomyopathy for a number of years.  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  Most recent echo in 4/21 showed EF 25-30%, diffuse hypokinesis. He used to drink heavily but has had rare ETOH for 7-8 years.  Rare smoking.  Recent admission in 5/21 with PE/DVT in setting of immobility/lack of activity with three hospital admissions in 4/21 and 5/21 prior to admission with PE/DVT.  He is now on Eliquis.   Currently seems to be doing ok.  BP is controlled.  He is not short of breath walking on flat ground.  No chest pain.  Occasional lightheadedness with standing.  Unable to lie flat, chronic orthopnea.  Dyspnea walking fast up stairs.  He is out of work and does not have insurance.  He has had a hard time getting his medications.   ECG (personally reviewed): NSR, LAE  Labs (5/21): K 4.3, creatinine 1.27  PMH: 1. Chronic systolic CHF: Nonischemic cardiomyopathy. - LHC (3/18) with 40% RCA stenosis.  - Echo (4/21): EF 25-30%, mild LVH, mild LV dilation.  2. RLE DVT/PE: 5/21.  In setting of hospitalizations and immobility.  3. OSA 4. COPD: Prior smoker.  5. H/o NSVT 6. Type 2 diabetes 7. HTN  SH: Out of work.  Rare ETOH, prior heavy drinker but cut back a lot 7-8 years ago.  Smokes rarely.   FH: No h/o venous thromboembolism.  Mother with CHF later in life, cause uncertain.   ROS: All systems reviewed and negative except as per HPI.   Current Outpatient Medications  Medication Sig Dispense Refill  . apixaban (ELIQUIS) 5 MG TABS tablet Take 1 tablet (5 mg total) by mouth 2 (two) times daily. 180 tablet 3  . carvedilol (COREG) 25 MG tablet Take 1 tablet (25 mg total) by mouth 2 (two) times daily with a meal. 60 tablet 0  . cloNIDine (CATAPRES) 0.1 MG tablet  Take 0.05 mg by mouth 2 (two) times daily. Takes 1/2 tablet twice daily    . colchicine 0.6 MG tablet Take 1 tablet (0.6 mg total) by mouth daily as needed. Take 1 tab daily for at least 6 more days. If  Symptoms persist past 6 days continue to use until prescription is finished 20 tablet 0  . Insulin Glargine (BASAGLAR KWIKPEN) 100 UNIT/ML Inject 0.24 mLs (24 Units total) into the skin at bedtime. 1 pen 0  . insulin lispro (HUMALOG) 100 UNIT/ML KwikPen Inject 0.03 mLs (3 Units total) into the skin 3 (three) times daily before meals. 1 pen 0  . Insulin Pen Needle 29G X 5MM MISC 1 Dose by Does not apply route 4 (four) times daily -  with meals and at bedtime. 200 each 0  . Ipratropium-Albuterol (COMBIVENT) 20-100 MCG/ACT AERS respimat Inhale 1 puff into the lungs every 6 (six) hours. 4 g 0  . nitroGLYCERIN (NITROSTAT) 0.4 MG SL tablet Place 1 tablet (0.4 mg total) under the tongue every 5 (five) minutes x 3 doses as needed for chest pain. 30 tablet 0  . rosuvastatin (CRESTOR) 20 MG tablet Take 20 mg by mouth at bedtime.    Marland Kitchen spironolactone (ALDACTONE) 25 MG tablet Take 1 tablet (25 mg total) by mouth 2 (two) times daily. 60 tablet 0  . torsemide (DEMADEX) 20 MG tablet Take  2 tablets (40 mg total) by mouth 2 (two) times daily. 360 tablet 3  . sacubitril-valsartan (ENTRESTO) 49-51 MG Take 1 tablet by mouth 2 (two) times daily. 30 tablet 0   No current facility-administered medications for this encounter.   BP 124/80   Pulse 71   Wt 106.7 kg (235 lb 3.2 oz)   SpO2 98%   BMI 31.90 kg/m  General: NAD Neck: No JVD, no thyromegaly or thyroid nodule.  Lungs: Clear to auscultation bilaterally with normal respiratory effort. CV: Nondisplaced PMI.  Heart regular S1/S2, no S3/S4, no murmur.  No peripheral edema.  No carotid bruit.  Normal pedal pulses.  Abdomen: Soft, nontender, no hepatosplenomegaly, no distention.  Skin: Intact without lesions or rashes.  Neurologic: Alert and oriented x 3.  Psych:  Normal affect. Extremities: No clubbing or cyanosis.  HEENT: Normal.   Assessment/Plan: 1. Chronic systolic CHF: Nonischemic cardiomyopathy based on 3/18 cath.  Cause uncertain, possibly due to prior ETOH abuse versus HTN versus viral myocarditis => he reports a severe respiratory infection several years ago. Today, NYHA class II symptoms.  He is not volume overloaded on exam.  - Continue torsemide 40 mg bid, BMET today.  - Stop losartan, start Entresto 49/51 bid.  We should be able to get this medication for him, discussed with our pharmacist. BMET 10 days.  - Continue spironolactone 25 mg bid.   - Continue Coreg 25 mg bid.  - 6 months of medical treatment, then repeat echo or get cardiac MRI (if we can get him insurance).  Can decide on ICD at that point.  2. OSA: Has CPAP titration pending.  3. PE/DVT: He was in the hospital multiple times this year with prolonged immobility.  Think this precipitated PE/DVT.   - Continue Eliquis, most likely will need this long term (as long as EF is low).  - Stop ASA 81 daily.  4. CAD: Nonobstructive on last cath.  - Check lipids today, continue Crestor.  5. HTN: ?Cause of cardiomyopathy or contributor.   - Med adjustments as above.  - Would eventually like to get him off clonidine.   Followup 3 wks with pharmacist, can try to titrate up Entresto again and stop clonidine at that time.  See me in 6 wks.   Loralie Champagne 01/19/2020

## 2020-01-19 NOTE — Telephone Encounter (Signed)
Saw patient in clinic yesterday to fill out [employer] patient assistance for [employer]. The application was sent in to the foundation and is pending. Gave patient a free 30 day card for [facility]. Later found out that patient has already used this voucher, it is once per lifetime.  Patient is aware that a 2 week sample is at the front check in desk.  [id number] EXP: 06/22  Will follow up on [employer] application.

## 2020-01-21 ENCOUNTER — Telehealth (HOSPITAL_COMMUNITY): Payer: Self-pay | Admitting: Licensed Clinical Social Worker

## 2020-01-21 ENCOUNTER — Telehealth (HOSPITAL_COMMUNITY): Payer: Self-pay | Admitting: Cardiology

## 2020-01-21 DIAGNOSIS — I2510 Atherosclerotic heart disease of native coronary artery without angina pectoris: Secondary | ICD-10-CM

## 2020-01-21 DIAGNOSIS — E782 Mixed hyperlipidemia: Secondary | ICD-10-CM

## 2020-01-21 DIAGNOSIS — E875 Hyperkalemia: Secondary | ICD-10-CM

## 2020-01-21 MED ORDER — FENOFIBRATE 145 MG PO TABS
145.0000 mg | ORAL_TABLET | Freq: Every day | ORAL | 6 refills | Status: DC
Start: 2020-01-21 — End: 2020-06-09

## 2020-01-21 MED ORDER — ROSUVASTATIN CALCIUM 20 MG PO TABS: 20.0000 mg | ORAL_TABLET | Freq: Every day | ORAL | 6 refills | Status: DC

## 2020-01-21 NOTE — Telephone Encounter (Signed)
CSW consulted to speak with pt regarding insurance questions.  CSW called pt but unable to reach- left VM requesting return call  Gabriel [name], [facility] Desk#: [phone] Cell#: [phone]

## 2020-01-21 NOTE — Telephone Encounter (Signed)
-----   Message from [name], CPhT sent at 01/19/2020  9:44 AM EDT ----- Unfortunately, there are no patient assistance options for Vascepa. Patient has no insurance. We would need to use another triglyceride lowering medication.

## 2020-01-21 NOTE — Telephone Encounter (Signed)
Pt aware reports he will return call ASAP to schedule lab appt-reports he just started a new job and will need to check his schedule. Advised of the importance of having this lab (elevated K) repeated sooner rather than later Meds updated and sent to pharmacy requested

## 2020-01-21 NOTE — Telephone Encounter (Signed)
Advanced Heart Failure Patient Advocate Encounter   Patient was approved to receive Entresto from [employer]  Patient ID: [id number] Effective dates: 01/20/20 through 01/20/21  Called and left patient message regarding approval.  [name], CPhT

## 2020-01-25 ENCOUNTER — Telehealth: Payer: Self-pay | Admitting: Pharmacist

## 2020-01-25 ENCOUNTER — Telehealth: Payer: Self-pay | Admitting: Pharmacy Technician

## 2020-01-25 NOTE — Telephone Encounter (Signed)
Received updated proof of income.  Patient eligible to receive medication assistance at [facility] until time for re-certification in 9359, and as long as eligibility requirements continue to be met.  [facility]

## 2020-01-25 NOTE — Telephone Encounter (Signed)
01/25/2020 3:27:25 PM - Call to Novo Nordisk-Levemir ordered 07/02/2019  -- Gabriel [name] - Tuesday, January 25, 2020 3:22 PM --I called [name] spoke with [place] on the [place] we faxed application 26/20/3559, this can not be process due to they attempted to contact patient 07/06/2019 and mailed a letter to verify provider date on application, and patient never responsed. I explained that patient would not have been able to give that information, that I submitted the application. She stated that since patient did not respond and now the application is over 741 day old-THEY WILL NEED A NEW APPLICATION. When reviewing in [facility] appears that patient on Basaglar and Humalog. I have discussed with Jennifer/[facility] and she will contact patient for appt. Patient was suppose to contact them for appt. I will pause this until patient is seen and we have scripts/orders for an insulin.

## 2020-01-26 ENCOUNTER — Ambulatory Visit: Payer: Medicaid Other | Admitting: Family

## 2020-01-27 ENCOUNTER — Telehealth (HOSPITAL_COMMUNITY): Payer: Self-pay | Admitting: Licensed Clinical Social Worker

## 2020-01-27 NOTE — Telephone Encounter (Signed)
CSW attempted to call pt again to discuss insurance and assist in obtaining coverage- unable to reach- left VM requesting return call  [name], [facility] Desk#: [phone] Cell#: [phone]

## 2020-01-28 ENCOUNTER — Telehealth (HOSPITAL_COMMUNITY): Payer: Self-pay

## 2020-01-28 ENCOUNTER — Telehealth (HOSPITAL_COMMUNITY): Payer: Self-pay | Admitting: Licensed Clinical Social Worker

## 2020-01-28 NOTE — Telephone Encounter (Addendum)
Patient stated that he was experiencing some chest pain and dizziness that lasted about 72min this morning while he was outside. Obie denied sob,blurry vision,nausea. Patient stated that once he went indoors in the ac his symptoms subsided and he felt better. He also stated that he has noticed some abdominal swelling and has had a weight gain of about 5-6IBS this week. Per Ann Maki patient was told to increase Torsemide to 80mg  today for his evening dose and to take 80mg  bid tomorrow(01/29/20) then go back to his regular dose of 40mg  bid and to call with a progress report on Monday(01/31/20). Patient was agreeable with plan.

## 2020-01-28 NOTE — Progress Notes (Signed)
Heart and Vascular Care Navigation  01/28/2020  Gabriel [name] [birth year] [id number]  Reason for Referral: Pt referred for assistance with insurance coverage.                                                                                                    Assessment: CSW called pt to discuss current insurance status with pt.  Pt reports he has not had insurance for awhile.  Had to stop work several years ago due to health status.  Pt applied for disability at that time and went through determination and appeal process for about 2 years- was denied officially last year in August.  Pt has not looked into other assistance since then and currently just has [employer].  Pt reports talking with a cone worker, Gabriel [name] with [employer], but is unsure if she is helping with him applying Medicaid or not.   CSW able to speak with Gabriel and confirm they are working with pt for ongoing and retro Medicaid and that application was submitted early May.  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  Reports no financial concerns as he lives with his father who is supporting him with housing costs at this time.                                    HRT/VAS Care Coordination    Living arrangements for the past 2 months Single Family Home   Lives with: Self   Patient Current Insurance Coverage Self-Pay; Medicaid Pending   Patient Has Concern With Paying Medical Bills Yes   Medical Bill Referrals: [employer]CA insurance   Does Patient Have Prescription Coverage? No   Patient Prescription Assistance Programs Patient Assistance Programs; Other   Patient Assistance Programs Medications [employer] for [employer] through 01/20/21   Other Assistance Programs Medications pt gets other medications through [facility] in [place]  Devices/Equipment None      Social History:                                                                             SDOH Screenings   Alcohol Screen:   . Last Alcohol Screening Score (AUDIT):   Depression (PHQ2-9):   . PHQ-2 Score:   Financial Resource Strain:   . Difficulty of Paying Living Expenses:   Food Insecurity:   . Worried About Charity fundraiser in the Last Year:   . [employer] of Food in the Last Year:   Housing: [place]   . Last  Housing Risk Score: 0  Physical Activity:   . Days of Exercise per Week:   . Minutes of Exercise per Session:   Social Connections:   . Frequency of Communication with Friends and Family:   . Frequency of Social Gatherings with Friends and Family:   . Attends Religious Services:   . Active Member of Clubs or Organizations:   . Attends Archivist Meetings:   Marland Kitchen Marital Status:   Stress:   . Feeling of Stress :   Tobacco Use: Medium Risk  . Smoking Tobacco Use: Former Smoker  . Smokeless Tobacco Use: Never Used  Transportation Needs:   . Film/video editor (Medical):   Marland Kitchen Lack of Transportation (Non-Medical):      Follow-up plan:    Pt to continue to work with Gabriel [name] to completed Medicaid application and call [employer] to initiate new disability claim.  CSW will continue to follow and assist as needed  Gabriel [name], [facility] Desk#: [phone] Cell#: [phone]

## 2020-01-31 ENCOUNTER — Telehealth (HOSPITAL_COMMUNITY): Payer: Self-pay | Admitting: Licensed Clinical Social Worker

## 2020-01-31 NOTE — Telephone Encounter (Signed)
CSW called pt to follow up regarding conversation about disability and Medicaid last week- unable to reach- left VM requesting return call  [name], [facility] Desk#: [phone] Cell#: [phone]

## 2020-02-09 ENCOUNTER — Ambulatory Visit: Payer: Medicaid Other | Admitting: Internal Medicine

## 2020-02-10 ENCOUNTER — Ambulatory Visit: Payer: Medicaid Other

## 2020-02-14 ENCOUNTER — Telehealth: Payer: Self-pay | Admitting: Cardiovascular Disease

## 2020-02-14 NOTE — Telephone Encounter (Signed)
Your telephone note indicates patient was to be switched to losartan and he was going to get it through [facility]. Loralie Champagne sent in a prescription for Entresto at the beginning of this month. Please advise as to whether Rx for [name] is to be refilled by our office or patient is to start losartan. Thanks!

## 2020-02-14 NOTE — Telephone Encounter (Signed)
Called over to speak with the nurse covering Dr. Aundra Dubin and she states that they discontinued his losartan and he qualified for assistance to get his medication free. So he does not need refill at this time.

## 2020-02-14 NOTE — Telephone Encounter (Signed)
*  STAT* If patient is at the pharmacy, call can be transferred to refill team.   1. Which medications need to be refilled? (please list name of each medication and dose if known) Entresto 49-51 mg bid  2. Which pharmacy/location (including street and city if local pharmacy) is medication to be sent to? [facility]  3. Do they need a 30 day or 90 day supply? 90  Patient states he is having issues with [facility] and his mail order

## 2020-02-15 ENCOUNTER — Inpatient Hospital Stay (HOSPITAL_COMMUNITY): Admission: RE | Admit: 2020-02-15 | Payer: Medicaid Other | Source: Ambulatory Visit

## 2020-02-15 ENCOUNTER — Telehealth (HOSPITAL_COMMUNITY): Payer: Self-pay | Admitting: Pharmacy Technician

## 2020-02-15 NOTE — Telephone Encounter (Signed)
Spoke with patient who states he has been waiting about three weeks and still has not received his Entresto shipment. I called the [employer] Patient Assistance Program and spoke with [place] who states their pharmacy has left two messages for him advising him to call back so they could place the fill. They can not ship it without speaking to him first. Patient states he never received these messages and does not answer calls from unknown numbers. I gave him the number for Patient Assistance so he could call back to get his shipment sent however he states he is not going to call back because he does not want it shipped. He states the mail system is unreliable. Patient prefers to have his medication sent to [facility] and reports he is now completely out of medication.

## 2020-02-15 NOTE — Telephone Encounter (Signed)
I received a message that the patient was out of Entresto and has yet to receive his first shipment. The patient has to call the foundation in order to get the [facility] sent over to him. I called and left the patient a message to assist with him getting his medication.  [name], CPhT

## 2020-02-22 NOTE — Telephone Encounter (Signed)
Spoke with patient who states that [employer] has yet to call him despite them having records stating that they have called and left a few messages.  He states he is going to get one of his other providers to send him a prescription so that he can get it from medication management in [place]. Called medication management in [place] and their system is down. They are going to call me back when they can look into the system and see if he can get his Entresto from them.  Will follow up.

## 2020-03-01 ENCOUNTER — Encounter (HOSPITAL_COMMUNITY): Payer: Medicaid Other | Admitting: Cardiology

## 2020-03-02 ENCOUNTER — Encounter: Payer: Self-pay | Admitting: Gerontology

## 2020-03-02 ENCOUNTER — Ambulatory Visit: Payer: Medicaid Other | Admitting: Gerontology

## 2020-03-02 ENCOUNTER — Other Ambulatory Visit: Payer: Self-pay

## 2020-03-02 VITALS — BP 109/74 | HR 89 | Ht 72.0 in | Wt 240.0 lb

## 2020-03-02 DIAGNOSIS — E785 Hyperlipidemia, unspecified: Secondary | ICD-10-CM

## 2020-03-02 DIAGNOSIS — E1169 Type 2 diabetes mellitus with other specified complication: Secondary | ICD-10-CM

## 2020-03-02 DIAGNOSIS — N529 Male erectile dysfunction, unspecified: Secondary | ICD-10-CM | POA: Insufficient documentation

## 2020-03-02 DIAGNOSIS — I10 Essential (primary) hypertension: Secondary | ICD-10-CM

## 2020-03-02 LAB — POCT GLYCOSYLATED HEMOGLOBIN (HGB A1C): Hemoglobin A1C: 9.6 % — AB (ref 4.0–5.6)

## 2020-03-02 MED ORDER — INSULIN LISPRO (1 UNIT DIAL) 100 UNIT/ML (KWIKPEN): 3.0000 [IU] | PEN_INJECTOR | Freq: Three times a day (TID) | SUBCUTANEOUS | 0 refills | Status: DC

## 2020-03-02 MED ORDER — BLOOD GLUCOSE MONITOR KIT: PACK | 3 refills | Status: DC

## 2020-03-02 NOTE — Progress Notes (Signed)
Established Patient Office Visit  Subjective:  Patient ID: Gabriel [name]., male    DOB: April 07, 1971  Age: 49 y.o. MRN: [id number]  CC:  Chief Complaint  Patient presents with  . Diabetes    HPI Gabriel [name]. is  A 49 y.o. male who presents for follow up hypertension, type 2 diabetes and lab review. He reports being compliant with all of his medication except the Entresto, and his insulins because it's expensive at [facility]. He also reports not checking his blood glucose due to not having a glucometer. His  HgbA1c and POCT glucose completed 03/02/2020 was noted to be 9.6% and 433 mg/dL respectively. He denies experiencing symptoms of hypoglycemic and peripheral neuropathy.  He was recently discharged from the Hospital on 01/02/2020, during his hospital course, he was treated for CHF, type 2 diabetes, and Pulmonary infarct. He continues to smoke 1/2 a pack of cigarette per week and admits the desire to quit. He was seen by Dr.  Aundra Dubin for Congestive Heart Failure (CHF) on 01/18/2020.  His most recent echo done 4/21 showed an Ejection fraction 25-30%, diffuse hypokinesis.Denies chest pain, shortness of breath, dizziness or numbness or tingling. He states that currently, he has been having difficulty achieving and maintaing erection. He states that his mood is good. Overall, he states that he is doing well and offers no further complaint.   Past Medical History:  Diagnosis Date  . Chest wall pain, chronic   . Chronic systolic CHF (congestive heart failure) ([place])    a. 03/2015 Echo: EF 45-50%; b. 12/2015 Echo: EF 20-25%; c. 02/2016 Echo: EF 30-35%; d. 11/2016 Echo: EF 40-45%; e. 06/2019 Echo: EF 30-35%.  . Chronic Troponin Elevation   . CKD (chronic kidney disease), stage III   . COPD (chronic obstructive pulmonary disease) ([place])   . Diabetes mellitus without complication ([place])   . Hypertension    Resolved since weight loss  . NICM (nonischemic cardiomyopathy) ([place])    a. 03/2015 Echo: EF 45-50%;  b. 04/2015 MV: small defect of mild severity in apex 2/2 apical thinning, EF 30-44%;  c. 12/2015 Echo: EF 20-25%; d. 02/2016 Echo: EF 30-35%; e. 11/2016 Echo: EF 40-45%, diff HK, mildly dil Ao root, mild MR, mod dil LA;  f. 12/2016 Cath: LM nl, LAD/Diags/LCX/OMs min irregs, RCA 40p/m/d; g. 06/2019 Echo: EF 30-35%, glob HK. Mod dil LA. RVSP 58.4 mmHg.  . Non-obstructive CAD (coronary artery disease)    a. 04/2015 low risk MV;  b. 12/2016 Cath: minor irregs in LAD/Diag/LCX/OM, RCA 40p/m/d.  [name] (nonsustained ventricular tachycardia) ([place])    a. 12/2015 noted on tele-->amio;  b. 12/2015 Event monitor: no VT. Atach noted.  . Obesity (BMI 30.0-34.9)   . Psoriasis   . Renal insufficiency   . Syncope    a. 01/2016 - felt to be vasovagal.  . Tobacco abuse     Past Surgical History:  Procedure Laterality Date  . AMPUTATION    . CARDIAC CATHETERIZATION    . FINGER AMPUTATION     Traumatic  . FINGER FRACTURE SURGERY Left   . LEFT HEART CATH AND CORONARY ANGIOGRAPHY N/A 01/06/2017   Procedure: Left Heart Cath and Coronary Angiography;  Surgeon: Wellington Hampshire, MD;  Location: [facility];  Service: Cardiovascular;  Laterality: N/A;    Family History  Problem Relation Age of Onset  . Diabetes Mellitus II Mother   . Hypothyroidism Mother   . Hypertension Mother   . Hypertension Father   .  Gout Father   . Cancer Paternal Aunt   . Cancer Maternal Grandfather   . Diabetes Maternal Grandfather     Social History   Socioeconomic History  . Marital status: Widowed    Spouse name: Not on file  . Number of children: 1  . Years of education: 54  . Highest education level: 12th grade  Occupational History  . Occupation: disabled  Tobacco Use  . Smoking status: Current Some Day Smoker    Packs/day: 0.50    Years: 33.00    Pack years: 16.50    Types: Cigarettes    Last attempt to quit: 08/18/2019    Years since quitting: 0.5  . Smokeless tobacco: Former Network engineer  . Vaping  Use: Former  Substance and Sexual Activity  . Alcohol use: Yes    Alcohol/week: 0.0 standard drinks    Comment: occassionally  . Drug use: No  . Sexual activity: Not Currently  Other Topics Concern  . Not on file  Social History Narrative      Social Determinants of Health   Financial Resource Strain:   . Difficulty of Paying Living Expenses:   Food Insecurity:   . Worried About Charity fundraiser in the Last Year:   . Arboriculturist in the Last Year:   Transportation Needs:   . Film/video editor (Medical):   Marland Kitchen Lack of Transportation (Non-Medical):   Physical Activity:   . Days of Exercise per Week:   . Minutes of Exercise per Session:   Stress:   . Feeling of Stress :   Social Connections:   . Frequency of Communication with Friends and Family:   . Frequency of Social Gatherings with Friends and Family:   . Attends Religious Services:   . Active Member of Clubs or Organizations:   . Attends Archivist Meetings:   Marland Kitchen Marital Status:   Intimate Partner Violence:   . Fear of Current or Ex-Partner:   . Emotionally Abused:   Marland Kitchen Physically Abused:   . Sexually Abused:     Outpatient Medications Prior to Visit  Medication Sig Dispense Refill  . apixaban (ELIQUIS) 5 MG TABS tablet Take 1 tablet (5 mg total) by mouth 2 (two) times daily. 180 tablet 3  . carvedilol (COREG) 25 MG tablet Take 1 tablet (25 mg total) by mouth 2 (two) times daily with a meal. 60 tablet 0  . cloNIDine (CATAPRES) 0.1 MG tablet Take 0.05 mg by mouth 2 (two) times daily. Takes 1/2 tablet twice daily    . fenofibrate (TRICOR) 145 MG tablet Take 1 tablet (145 mg total) by mouth daily. 30 tablet 6  . Insulin Glargine (BASAGLAR KWIKPEN) 100 UNIT/ML Inject 0.24 mLs (24 Units total) into the skin at bedtime. 1 pen 0  . nitroGLYCERIN (NITROSTAT) 0.4 MG SL tablet Place 1 tablet (0.4 mg total) under the tongue every 5 (five) minutes x 3 doses as needed for chest pain. 30 tablet 0  . rosuvastatin  (CRESTOR) 20 MG tablet Take 1 tablet (20 mg total) by mouth at bedtime. 30 tablet 6  . sacubitril-valsartan (ENTRESTO) 49-51 MG Take 1 tablet by mouth 2 (two) times daily. 30 tablet 0  . spironolactone (ALDACTONE) 25 MG tablet Take 1 tablet (25 mg total) by mouth 2 (two) times daily. 60 tablet 0  . torsemide (DEMADEX) 20 MG tablet Take 2 tablets (40 mg total) by mouth 2 (two) times daily. 360 tablet 3  . insulin  lispro (HUMALOG) 100 UNIT/ML KwikPen Inject 0.03 mLs (3 Units total) into the skin 3 (three) times daily before meals. 1 pen 0  . Insulin Pen Needle 29G X 5MM MISC 1 Dose by Does not apply route 4 (four) times daily -  with meals and at bedtime. 200 each 0  . Ipratropium-Albuterol (COMBIVENT) 20-100 MCG/ACT AERS respimat Inhale 1 puff into the lungs every 6 (six) hours. (Patient not taking: Reported on 03/02/2020) 4 g 0  . colchicine 0.6 MG tablet Take 1 tablet (0.6 mg total) by mouth daily as needed. Take 1 tab daily for at least 6 more days. If  Symptoms persist past 6 days continue to use until prescription is finished 20 tablet 0   No facility-administered medications prior to visit.    Allergies  Allergen Reactions  . Metformin And Related Nausea And Vomiting  . Prednisone Other (See Comments)    Reaction: Hallucinations    . Zantac [Ranitidine Hcl] Diarrhea and Nausea Only    Night sweats    ROS Review of Systems  Constitutional: Negative.   Eyes: Negative for visual disturbance.  Respiratory: Negative.   Cardiovascular: Negative.   Gastrointestinal: Negative.   Genitourinary: Negative.   Musculoskeletal: Negative.   Skin: Negative.   Neurological: Negative.   Psychiatric/Behavioral: Negative.       Objective:    Physical Exam Constitutional:      Appearance: Normal appearance.  HENT:     Head: Normocephalic and atraumatic.  Cardiovascular:     Rate and Rhythm: Normal rate and regular rhythm.     Pulses: Normal pulses.     Heart sounds: Normal heart sounds.   Pulmonary:     Effort: Pulmonary effort is normal.     Breath sounds: Normal breath sounds.  Abdominal:     General: Bowel sounds are normal.  Neurological:     General: No focal deficit present.     Mental Status: He is alert and oriented to person, place, and time.  Psychiatric:        Mood and Affect: Mood normal.        Behavior: Behavior normal.        Thought Content: Thought content normal.        Judgment: Judgment normal.     BP 109/74 (BP Location: Left Arm, Patient Position: Sitting)   Pulse 89   Ht 6' (1.829 m)   Wt 240 lb (108.9 kg)   SpO2 98%   BMI 32.55 kg/m  Wt Readings from Last 3 Encounters:  03/02/20 240 lb (108.9 kg)  01/18/20 235 lb 3.2 oz (106.7 kg)  01/02/20 231 lb 12.8 oz (105.1 kg)   He was encouraged to continue on his weight loss regimen.  Health Maintenance Due  Topic Date Due  . FOOT EXAM  Never done  . OPHTHALMOLOGY EXAM  Never done  . COVID-19 Vaccine (1) Never done  . TETANUS/TDAP  Never done  . COLONOSCOPY  Never done    There are no preventive care reminders to display for this patient.  Lab Results  Component Value Date   TSH 1.740 09/09/2018   Lab Results  Component Value Date   WBC 11.6 (H) 01/02/2020   HGB 15.5 01/02/2020   HCT 47.9 01/02/2020   MCV 84.8 01/02/2020   PLT 220 01/02/2020   Lab Results  Component Value Date   NA 135 01/18/2020   K 5.5 (H) 01/18/2020   CO2 31 01/18/2020   GLUCOSE 329 (H) 01/18/2020  BUN 41 (H) 01/18/2020   CREATININE 1.37 (H) 01/18/2020   BILITOT 1.0 12/27/2019   ALKPHOS 89 12/27/2019   AST 22 12/27/2019   ALT 59 (H) 12/27/2019   PROT 6.6 12/27/2019   ALBUMIN 3.3 (L) 12/27/2019   CALCIUM 10.2 01/18/2020   ANIONGAP 12 01/18/2020   Lab Results  Component Value Date   CHOL 246 (H) 01/18/2020   Lab Results  Component Value Date   HDL 27 (L) 01/18/2020   Lab Results  Component Value Date   LDLCALC UNABLE TO CALCULATE IF TRIGLYCERIDE OVER 400 mg/dL 01/18/2020   Lab  Results  Component Value Date   TRIG 797 (H) 01/18/2020   Lab Results  Component Value Date   CHOLHDL 9.1 01/18/2020   Lab Results  Component Value Date   HGBA1C 9.6 (A) 03/02/2020      Assessment & Plan:   1. Essential hypertension His blood pressure is under control, he will continue current treatment regimen.  - he was advised to continue on DASH diet and exercise as tolerated. -He was advised to quit smoking   2. Type 2 diabetes mellitus with hyperlipidemia ([place]) His diabetes is not under control. He will continue current treatment regimen.  - POCT HgB A1C; Future - His HgbA1c was 9.6% and his goal should be less than 7%. - blood glucose meter kit and supplies KIT; Dispense based on patient and insurance preference. Use up to four times daily as directed. (FOR ICD-9 250.00, 250.01).  Dispense: 1 each; Refill: 3 - HgB A1c; Future - POCT HgB A1C -He was advised to continue ADA diet -He was advised to decrease his intake of concentrated sweets and exercise as tolerated. -He was advised to document his blood glucose twice daily, and bring log to next appointment.   3. Erectile dysfunction, unspecified erectile dysfunction type  -  He was advised to complete [facility] financial application for Ambulatory referral to Urology     Follow-up: Return in about 27 days (around 03/29/2020), or if symptoms worsen or fail to improve.    Carney Corners, RN

## 2020-03-02 NOTE — Patient Instructions (Signed)
Carbohydrate Counting for Diabetes Mellitus, Adult  Carbohydrate counting is a method of keeping track of how many carbohydrates you eat. Eating carbohydrates naturally increases the amount of sugar (glucose) in the blood. Counting how many carbohydrates you eat helps keep your blood glucose within normal limits, which helps you manage your diabetes (diabetes mellitus). It is important to know how many carbohydrates you can safely have in each meal. This is different for every person. A diet and nutrition specialist (registered dietitian) can help you make a meal plan and calculate how many carbohydrates you should have at each meal and snack. Carbohydrates are found in the following foods:  Grains, such as breads and cereals.  Dried beans and soy products.  Starchy vegetables, such as potatoes, peas, and corn.  Fruit and fruit juices.  Milk and yogurt.  Sweets and snack foods, such as cake, cookies, candy, chips, and soft drinks. How do I count carbohydrates? There are two ways to count carbohydrates in food. You can use either of the methods or a combination of both. Reading "Nutrition Facts" on packaged food The "Nutrition Facts" list is included on the labels of almost all packaged foods and beverages in the [place] It includes:  The serving size.  Information about nutrients in each serving, including the grams (g) of carbohydrate per serving. To use the "Nutrition Facts":  Decide how many servings you will have.  Multiply the number of servings by the number of carbohydrates per serving.  The resulting number is the total amount of carbohydrates that you will be having. Learning standard serving sizes of other foods When you eat carbohydrate foods that are not packaged or do not include "Nutrition Facts" on the label, you need to measure the servings in order to count the amount of carbohydrates:  Measure the foods that you will eat with a food scale or measuring cup, if needed.   Decide how many standard-size servings you will eat.  Multiply the number of servings by 15. Most carbohydrate-rich foods have about 15 g of carbohydrates per serving. ? For example, if you eat 8 oz (170 g) of strawberries, you will have eaten 2 servings and 30 g of carbohydrates (2 servings x 15 g = 30 g).  For foods that have more than one food mixed, such as soups and casseroles, you must count the carbohydrates in each food that is included. The following list contains standard serving sizes of common carbohydrate-rich foods. Each of these servings has about 15 g of carbohydrates:   hamburger bun or  English muffin.   oz (15 mL) syrup.   oz (14 g) jelly.  1 slice of bread.  1 six-inch tortilla.  3 oz (85 g) cooked rice or pasta.  4 oz (113 g) cooked dried beans.  4 oz (113 g) starchy vegetable, such as peas, corn, or potatoes.  4 oz (113 g) hot cereal.  4 oz (113 g) mashed potatoes or  of a large baked potato.  4 oz (113 g) canned or frozen fruit.  4 oz (120 mL) fruit juice.  4-6 crackers.  6 chicken nuggets.  6 oz (170 g) unsweetened dry cereal.  6 oz (170 g) plain fat-free yogurt or yogurt sweetened with artificial sweeteners.  8 oz (240 mL) milk.  8 oz (170 g) fresh fruit or one small piece of fruit.  24 oz (680 g) popped popcorn. Example of carbohydrate counting Sample meal  3 oz (85 g) chicken breast.  6 oz (170 g)   brown rice.  4 oz (113 g) corn.  8 oz (240 mL) milk.  8 oz (170 g) strawberries with sugar-free whipped topping. Carbohydrate calculation 1. Identify the foods that contain carbohydrates: ? Rice. ? Corn. ? Milk. ? Strawberries. 2. Calculate how many servings you have of each food: ? 2 servings rice. ? 1 serving corn. ? 1 serving milk. ? 1 serving strawberries. 3. Multiply each number of servings by 15 g: ? 2 servings rice x 15 g = 30 g. ? 1 serving corn x 15 g = 15 g. ? 1 serving milk x 15 g = 15 g. ? 1 serving  strawberries x 15 g = 15 g. 4. Add together all of the amounts to find the total grams of carbohydrates eaten: ? 30 g + 15 g + 15 g + 15 g = 75 g of carbohydrates total. Summary  Carbohydrate counting is a method of keeping track of how many carbohydrates you eat.  Eating carbohydrates naturally increases the amount of sugar (glucose) in the blood.  Counting how many carbohydrates you eat helps keep your blood glucose within normal limits, which helps you manage your diabetes.  A diet and nutrition specialist (registered dietitian) can help you make a meal plan and calculate how many carbohydrates you should have at each meal and snack. This information is not intended to replace advice given to you by your health care provider. Make sure you discuss any questions you have with your health care provider. Document Revised: 02/27/2017 Document Reviewed: 01/17/2016 Elsevier Patient Education  2020 Elsevier Inc. DASH Eating Plan DASH stands for "Dietary Approaches to Stop Hypertension." The DASH eating plan is a healthy eating plan that has been shown to reduce high blood pressure (hypertension). It may also reduce your risk for type 2 diabetes, heart disease, and stroke. The DASH eating plan may also help with weight loss. What are tips for following this plan?  General guidelines  Avoid eating more than 2,300 mg (milligrams) of salt (sodium) a day. If you have hypertension, you may need to reduce your sodium intake to 1,500 mg a day.  Limit alcohol intake to no more than 1 drink a day for nonpregnant women and 2 drinks a day for men. One drink equals 12 oz of beer, 5 oz of wine, or 1 oz of hard liquor.  Work with your health care provider to maintain a healthy body weight or to lose weight. Ask what an ideal weight is for you.  Get at least 30 minutes of exercise that causes your heart to beat faster (aerobic exercise) most days of the week. Activities may include walking, swimming, or  biking.  Work with your health care provider or diet and nutrition specialist (dietitian) to adjust your eating plan to your individual calorie needs. Reading food labels   Check food labels for the amount of sodium per serving. Choose foods with less than 5 percent of the Daily Value of sodium. Generally, foods with less than 300 mg of sodium per serving fit into this eating plan.  To find whole grains, look for the word "whole" as the first word in the ingredient list. Shopping  Buy products labeled as "low-sodium" or "no salt added."  Buy fresh foods. Avoid canned foods and premade or frozen meals. Cooking  Avoid adding salt when cooking. Use salt-free seasonings or herbs instead of table salt or sea salt. Check with your health care provider or pharmacist before using salt substitutes.  Do not   fry foods. Cook foods using healthy methods such as baking, boiling, grilling, and broiling instead.  Cook with heart-healthy oils, such as olive, canola, soybean, or sunflower oil. Meal planning  Eat a balanced diet that includes: ? 5 or more servings of fruits and vegetables each day. At each meal, try to fill half of your plate with fruits and vegetables. ? Up to 6-8 servings of whole grains each day. ? Less than 6 oz of lean meat, poultry, or fish each day. A 3-oz serving of meat is about the same size as a deck of cards. One egg equals 1 oz. ? 2 servings of low-fat dairy each day. ? A serving of nuts, seeds, or beans 5 times each week. ? Heart-healthy fats. Healthy fats called Omega-3 fatty acids are found in foods such as flaxseeds and coldwater fish, like sardines, salmon, and mackerel.  Limit how much you eat of the following: ? Canned or prepackaged foods. ? Food that is high in trans fat, such as fried foods. ? Food that is high in saturated fat, such as fatty meat. ? Sweets, desserts, sugary drinks, and other foods with added sugar. ? Full-fat dairy products.  Do not salt  foods before eating.  Try to eat at least 2 vegetarian meals each week.  Eat more home-cooked food and less restaurant, buffet, and fast food.  When eating at a restaurant, ask that your food be prepared with less salt or no salt, if possible. What foods are recommended? The items listed may not be a complete list. Talk with your dietitian about what dietary choices are best for you. Grains Whole-grain or whole-wheat bread. Whole-grain or whole-wheat pasta. Brown rice. Oatmeal. Quinoa. Bulgur. Whole-grain and low-sodium cereals. Pita bread. Low-fat, low-sodium crackers. Whole-wheat flour tortillas. Vegetables Fresh or frozen vegetables (raw, steamed, roasted, or grilled). Low-sodium or reduced-sodium tomato and vegetable juice. Low-sodium or reduced-sodium tomato sauce and tomato paste. Low-sodium or reduced-sodium canned vegetables. Fruits All fresh, dried, or frozen fruit. Canned fruit in natural juice (without added sugar). Meat and other protein foods Skinless chicken or turkey. Ground chicken or turkey. Pork with fat trimmed off. Fish and seafood. Egg whites. Dried beans, peas, or lentils. Unsalted nuts, nut butters, and seeds. Unsalted canned beans. Lean cuts of beef with fat trimmed off. Low-sodium, lean deli meat. Dairy Low-fat (1%) or fat-free (skim) milk. Fat-free, low-fat, or reduced-fat cheeses. Nonfat, low-sodium ricotta or cottage cheese. Low-fat or nonfat yogurt. Low-fat, low-sodium cheese. Fats and oils Soft margarine without trans fats. Vegetable oil. Low-fat, reduced-fat, or light mayonnaise and salad dressings (reduced-sodium). Canola, safflower, olive, soybean, and sunflower oils. Avocado. Seasoning and other foods Herbs. Spices. Seasoning mixes without salt. Unsalted popcorn and pretzels. Fat-free sweets. What foods are not recommended? The items listed may not be a complete list. Talk with your dietitian about what dietary choices are best for you. Grains Baked goods  made with fat, such as croissants, muffins, or some breads. Dry pasta or rice meal packs. Vegetables Creamed or fried vegetables. Vegetables in a cheese sauce. Regular canned vegetables (not low-sodium or reduced-sodium). Regular canned tomato sauce and paste (not low-sodium or reduced-sodium). Regular tomato and vegetable juice (not low-sodium or reduced-sodium). Pickles. Olives. Fruits Canned fruit in a light or heavy syrup. Fried fruit. Fruit in cream or butter sauce. Meat and other protein foods Fatty cuts of meat. Ribs. Fried meat. Bacon. Sausage. Bologna and other processed lunch meats. Salami. Fatback. Hotdogs. Bratwurst. Salted nuts and seeds. Canned beans with added salt.   Canned or smoked fish. Whole eggs or egg yolks. Chicken or turkey with skin. Dairy Whole or 2% milk, cream, and half-and-half. Whole or full-fat cream cheese. Whole-fat or sweetened yogurt. Full-fat cheese. Nondairy creamers. Whipped toppings. Processed cheese and cheese spreads. Fats and oils Butter. Stick margarine. Lard. Shortening. Ghee. Bacon fat. Tropical oils, such as coconut, palm kernel, or palm oil. Seasoning and other foods Salted popcorn and pretzels. Onion salt, garlic salt, seasoned salt, table salt, and sea salt. Worcestershire sauce. Tartar sauce. Barbecue sauce. Teriyaki sauce. Soy sauce, including reduced-sodium. Steak sauce. Canned and packaged gravies. Fish sauce. Oyster sauce. Cocktail sauce. Horseradish that you find on the shelf. Ketchup. Mustard. Meat flavorings and tenderizers. Bouillon cubes. Hot sauce and Tabasco sauce. Premade or packaged marinades. Premade or packaged taco seasonings. Relishes. Regular salad dressings. Where to find more information:  National Heart, Lung, and Blood Institute: www.nhlbi.nih.gov  American Heart Association: www.heart.org Summary  The DASH eating plan is a healthy eating plan that has been shown to reduce high blood pressure (hypertension). It may also reduce  your risk for type 2 diabetes, heart disease, and stroke.  With the DASH eating plan, you should limit salt (sodium) intake to 2,300 mg a day. If you have hypertension, you may need to reduce your sodium intake to 1,500 mg a day.  When on the DASH eating plan, aim to eat more fresh fruits and vegetables, whole grains, lean proteins, low-fat dairy, and heart-healthy fats.  Work with your health care provider or diet and nutrition specialist (dietitian) to adjust your eating plan to your individual calorie needs. This information is not intended to replace advice given to you by your health care provider. Make sure you discuss any questions you have with your health care provider. Document Revised: 07/18/2017 Document Reviewed: 07/29/2016 Elsevier Patient Education  2020 Elsevier Inc.  

## 2020-03-07 ENCOUNTER — Telehealth (HOSPITAL_COMMUNITY): Payer: Self-pay | Admitting: Pharmacist

## 2020-03-07 MED ORDER — SACUBITRIL-VALSARTAN 49-51 MG PO TABS: 1.0000 | ORAL_TABLET | Freq: Two times a day (BID) | ORAL | 3 refills | Status: DC

## 2020-03-07 NOTE — Telephone Encounter (Signed)
Spoke with Dewaine Oats with medication management and she helped explain that they can order and provide Entresto for free for the patient. I will ask Lauren to send over a prescription to the clinic so that they are able to provide that for [name].

## 2020-03-07 NOTE — Telephone Encounter (Signed)
[facility] sent to [facility] per patient request.   Audry Riles, PharmD, BCPS, BCCP, CPP [facility] Pharmacist [phone]

## 2020-03-08 ENCOUNTER — Other Ambulatory Visit: Payer: Self-pay

## 2020-03-08 ENCOUNTER — Other Ambulatory Visit: Payer: Medicaid Other

## 2020-03-08 DIAGNOSIS — E782 Mixed hyperlipidemia: Secondary | ICD-10-CM

## 2020-03-08 DIAGNOSIS — E119 Type 2 diabetes mellitus without complications: Secondary | ICD-10-CM

## 2020-03-08 DIAGNOSIS — I10 Essential (primary) hypertension: Secondary | ICD-10-CM

## 2020-03-08 DIAGNOSIS — E1169 Type 2 diabetes mellitus with other specified complication: Secondary | ICD-10-CM

## 2020-03-08 DIAGNOSIS — E785 Hyperlipidemia, unspecified: Secondary | ICD-10-CM

## 2020-03-08 DIAGNOSIS — Z794 Long term (current) use of insulin: Secondary | ICD-10-CM

## 2020-03-08 DIAGNOSIS — I5022 Chronic systolic (congestive) heart failure: Secondary | ICD-10-CM

## 2020-03-08 MED ORDER — TORSEMIDE 20 MG PO TABS: 40.0000 mg | ORAL_TABLET | Freq: Two times a day (BID) | ORAL | 3 refills | Status: DC

## 2020-03-08 MED ORDER — CLONIDINE HCL 0.1 MG PO TABS: 0.0500 mg | ORAL_TABLET | Freq: Two times a day (BID) | ORAL | 2 refills | Status: DC

## 2020-03-08 MED ORDER — CARVEDILOL 25 MG PO TABS: 25.0000 mg | ORAL_TABLET | Freq: Two times a day (BID) | ORAL | 2 refills | Status: DC

## 2020-03-08 MED ORDER — SPIRONOLACTONE 25 MG PO TABS: 25.0000 mg | ORAL_TABLET | Freq: Two times a day (BID) | ORAL | 2 refills | Status: DC

## 2020-03-08 NOTE — Progress Notes (Unsigned)
bmp 

## 2020-03-09 LAB — BASIC METABOLIC PANEL
BUN/Creatinine Ratio: 17 (ref 9–20)
BUN: 20 mg/dL (ref 6–24)
CO2: 23 mmol/L (ref 20–29)
Calcium: 9.6 mg/dL (ref 8.7–10.2)
Chloride: 97 mmol/L (ref 96–106)
Creatinine, Ser: 1.21 mg/dL (ref 0.76–1.27)
GFR calc Af Amer: 80 mL/min/{1.73_m2} (ref >59)
GFR calc non Af Amer: 69 mL/min/{1.73_m2} (ref >59)
Glucose: 386 mg/dL — ABNORMAL HIGH (ref 65–99)
Potassium: 4.1 mmol/L (ref 3.5–5.2)
Sodium: 140 mmol/L (ref 134–144)

## 2020-03-09 LAB — CBC
Hematocrit: 46.3 % (ref 37.5–51.0)
Hemoglobin: 15.5 g/dL (ref 13.0–17.7)
MCH: 30.3 pg (ref 26.6–33.0)
MCHC: 33.5 g/dL (ref 31.5–35.7)
MCV: 91 fL (ref 79–97)
Platelets: 180 10*3/uL (ref 150–450)
RBC: 5.11 x10E6/uL (ref 4.14–5.80)
RDW: 15.6 % — ABNORMAL HIGH (ref 11.6–15.4)
WBC: 7.8 10*3/uL (ref 3.4–10.8)

## 2020-03-09 LAB — URINALYSIS, ROUTINE W REFLEX MICROSCOPIC
Bilirubin, UA: NEGATIVE
Ketones, UA: NEGATIVE
Leukocytes,UA: NEGATIVE
Nitrite, UA: NEGATIVE
Protein,UA: NEGATIVE
RBC, UA: NEGATIVE
Specific Gravity, UA: 1.01 (ref 1.005–1.030)
Urobilinogen, Ur: 0.2 mg/dL (ref 0.2–1.0)
pH, UA: 5 (ref 5.0–7.5)

## 2020-03-09 LAB — LIPID PANEL
Chol/HDL Ratio: 7 ratio — ABNORMAL HIGH (ref 0.0–5.0)
Cholesterol, Total: 160 mg/dL (ref 100–199)
HDL: 23 mg/dL — ABNORMAL LOW (ref >39)
LDL Chol Calc (NIH): 42 mg/dL (ref 0–99)
Triglycerides: 672 mg/dL (ref 0–149)
VLDL Cholesterol Cal: 95 mg/dL — ABNORMAL HIGH (ref 5–40)

## 2020-03-09 LAB — HEMOGLOBIN A1C
Est. average glucose Bld gHb Est-mCnc: 232 mg/dL
Hgb A1c MFr Bld: 9.7 % — ABNORMAL HIGH (ref 4.8–5.6)

## 2020-03-10 ENCOUNTER — Telehealth: Payer: Self-pay

## 2020-03-10 NOTE — Telephone Encounter (Signed)
-----   Message from Theora Gianotti, NP sent at 03/09/2020  8:09 AM EDT ----- LDL at goal however, TG remain markedly elevated despite fenofibrate and statin therapy.  Ideally, lifestyle modifications such as reduced carbohydrate intake and wt loss need to be implemented, as he has had closer to  normal TG  in the  past.

## 2020-03-10 NOTE — Telephone Encounter (Signed)
Call to patient to review labs.    Pt verbalized understanding and has no further questions at this time.    Advised pt to call for any further questions or concerns.  No further orders.  Confirmed upcoming appt next month.

## 2020-03-14 ENCOUNTER — Telehealth: Payer: Self-pay | Admitting: Pharmacy Technician

## 2020-03-14 NOTE — Telephone Encounter (Signed)
Patient had provided [facility] with a letter of support on Jan 05, 2020 stating that he was unemployed.  Patient presented to [facility] on 03/10/20 to pick-up medications.  Patient was wearing a work shirt from [employer] that had his first name embroidered on it.  I asked patient if he was working at [employer].  Patient asked why I wanted to know.  I said if he was working then I needed a month worth of paystubs.  Patient stated that he was not working at [employer].  He said he got the shirt at [employer].  I asked him about his first name being embroidered on shirt.  Patient stated that it was on the shirt when he got it at [employer].  I contacted [employer] and asked to speak with the manager to verify employment for patient.  I was told that Rod Holler was Dealer and she was not in.    I called [employer] on 03/13/20 to speak with Rod Holler.  The gentleman on the phone answered [employer], Kirin Pastorino speaking.  I responded that it was Inez Catalina from [facility].  The gentleman stated that I had the wrong number.  I asked who I was talking to.  The gentleman responded Tom. I asked if I could speak with Rod Holler.  He stated that a Rod Holler did not work at [employer].  I asked if I could speak to the manager and he said the manager's name was Remo Lipps and she was not in.  I asked when Remo Lipps would be in and he said that he was not sure.  I thanked the gentleman and hung-up.  I called [employer] on 03/14/20 and asked to speak to the manager.  A gentleman by the name of Wilmon Pali stated that he was the Freight forwarder.  I asked if he would verify if Akiel Fennell, Brooke Bonito was an employee and he stated that Charlotte Crumb was not an employee.  I asked if he would provide written documentation and he stated that he could not because he did not have access to a fax machine or computer to produce a letter.  I thanked the gentleman and hung-up.  I called [employer]  [facility] on 03/14/20 and spoke with Ward H.  He verified that Esaw Dace, Brooke Bonito is an employee at [employer] (Reference [phone]).  I told him that I had called the [facility] location and spoke with a Wilmon Pali who stated that Mr. 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  Ward H. Stated that according to the corporate information, Mr. Taegen, Delker was showing as actively being employed at [employer]. I thanked the gentleman and hung-up.  Mailing patient a request for last 30 days of paystubs.  No additional medication assistance will be provided until patient provides this documentation.  [facility]

## 2020-03-15 ENCOUNTER — Other Ambulatory Visit: Payer: Self-pay

## 2020-03-15 ENCOUNTER — Ambulatory Visit (INDEPENDENT_AMBULATORY_CARE_PROVIDER_SITE_OTHER): Payer: Self-pay | Admitting: Urology

## 2020-03-15 ENCOUNTER — Ambulatory Visit: Payer: Medicaid Other | Admitting: Cardiovascular Disease

## 2020-03-15 ENCOUNTER — Encounter: Payer: Self-pay | Admitting: Urology

## 2020-03-15 VITALS — BP 154/107 | HR 101 | Ht 72.0 in | Wt 241.3 lb

## 2020-03-15 DIAGNOSIS — N5201 Erectile dysfunction due to arterial insufficiency: Secondary | ICD-10-CM | POA: Insufficient documentation

## 2020-03-15 DIAGNOSIS — Z125 Encounter for screening for malignant neoplasm of prostate: Secondary | ICD-10-CM | POA: Insufficient documentation

## 2020-03-15 NOTE — Progress Notes (Signed)
01/18/20 9:48 AM   Gabriel [name]. 1970-09-04 [id number]  Referring provider: Langston Reusing, NP [place],  [place] [address] Chief Complaint  Patient presents with   Erectile Dysfunction    HPI: Gabriel [name]. is a 49 y.o. male who presents at the request of Caryl Asp, NP for evaluation of erectile dysfunction.  -4-year history of ED -Complains of partial erections that are not firm enough for penetration  -SHIM 5/25 indicating severe ED -<15 degree curvature to the left without pain -No previous treatment for ED -organic risk factors include coronary artery disease, diabetes, cardiomyopathy, tobacco use and antihypertensive medication -History hypogonadism -History of stone disease; CT 2019 showed no calculi  PMH: Past Medical History:  Diagnosis Date   Chest wall pain, chronic    Chronic systolic CHF (congestive heart failure) ([place])    a. 03/2015 Echo: EF 45-50%; b. 12/2015 Echo: EF 20-25%; c. 02/2016 Echo: EF 30-35%; d. 11/2016 Echo: EF 40-45%; e. 06/2019 Echo: EF 30-35%.   Chronic Troponin Elevation    CKD (chronic kidney disease), stage III    COPD (chronic obstructive pulmonary disease) (HCC)    Diabetes mellitus without complication ([place])    Hypertension    Resolved since weight loss   NICM (nonischemic cardiomyopathy) ([place])    a. 03/2015 Echo: EF 45-50%; b. 04/2015 MV: small defect of mild severity in apex 2/2 apical thinning, EF 30-44%;  c. 12/2015 Echo: EF 20-25%; d. 02/2016 Echo: EF 30-35%; e. 11/2016 Echo: EF 40-45%, diff HK, mildly dil Ao root, mild MR, mod dil LA;  f. 12/2016 Cath: LM nl, LAD/Diags/LCX/OMs min irregs, RCA 40p/m/d; g. 06/2019 Echo: EF 30-35%, glob HK. Mod dil LA. RVSP 58.4 mmHg.   Non-obstructive CAD (coronary artery disease)    a. 04/2015 low risk MV;  b. 12/2016 Cath: minor irregs in LAD/Diag/LCX/OM, RCA 40p/m/d.   NSVT (nonsustained ventricular tachycardia) ([place])    a. 12/2015 noted on  tele-->amio;  b. 12/2015 Event monitor: no VT. Atach noted.   Obesity (BMI 30.0-34.9)    Psoriasis    Renal insufficiency    Syncope    a. 01/2016 - felt to be vasovagal.   Tobacco abuse     Surgical History: Past Surgical History:  Procedure Laterality Date   AMPUTATION     CARDIAC CATHETERIZATION     FINGER AMPUTATION     Traumatic   FINGER FRACTURE SURGERY Left    LEFT HEART CATH AND CORONARY ANGIOGRAPHY N/A 01/06/2017   Procedure: Left Heart Cath and Coronary Angiography;  Surgeon: Wellington Hampshire, MD;  Location: [facility];  Service: Cardiovascular;  Laterality: N/A;    Home Medications:  Allergies as of 03/15/2020      Reactions   Metformin And Related Nausea And Vomiting   Prednisone Other (See Comments)   Reaction: Hallucinations    Zantac [ranitidine Hcl] Diarrhea, Nausea Only   Night sweats      Medication List       Accurate as of March 15, 2020  9:48 AM. If you have any questions, ask your nurse or doctor.        apixaban 5 MG Tabs tablet Commonly known as: ELIQUIS Take 1 tablet (5 mg total) by mouth 2 (two) times daily.   Basaglar KwikPen 100 UNIT/ML Inject 0.24 mLs (24 Units total) into the skin at bedtime.   blood glucose meter kit and supplies Kit Dispense based on patient and insurance preference.  Use up to four times daily as directed. (FOR ICD-9 250.00, 250.01).   carvedilol 25 MG tablet Commonly known as: COREG Take 1 tablet (25 mg total) by mouth 2 (two) times daily with a meal.   cloNIDine 0.1 MG tablet Commonly known as: CATAPRES Take 0.5 tablets (0.05 mg total) by mouth 2 (two) times daily. Takes 1/2 tablet twice daily   fenofibrate 145 MG tablet Commonly known as: Tricor Take 1 tablet (145 mg total) by mouth daily.   insulin lispro 100 UNIT/ML KwikPen Commonly known as: HUMALOG Inject 0.03 mLs (3 Units total) into the skin 3 (three) times daily before meals.   Insulin Pen Needle 29G X 5MM Misc 1 Dose by Does  not apply route 4 (four) times daily -  with meals and at bedtime.   Ipratropium-Albuterol 20-100 MCG/ACT Aers respimat Commonly known as: COMBIVENT Inhale 1 puff into the lungs every 6 (six) hours.   nitroGLYCERIN 0.4 MG SL tablet Commonly known as: NITROSTAT Place 1 tablet (0.4 mg total) under the tongue every 5 (five) minutes x 3 doses as needed for chest pain.   rosuvastatin 20 MG tablet Commonly known as: CRESTOR Take 1 tablet (20 mg total) by mouth at bedtime.   sacubitril-valsartan 49-51 MG Commonly known as: ENTRESTO Take 1 tablet by mouth 2 (two) times daily.   spironolactone 25 MG tablet Commonly known as: ALDACTONE Take 1 tablet (25 mg total) by mouth 2 (two) times daily.   torsemide 20 MG tablet Commonly known as: DEMADEX Take 2 tablets (40 mg total) by mouth 2 (two) times daily.       Allergies:  Allergies  Allergen Reactions   Metformin And Related Nausea And Vomiting   Prednisone Other (See Comments)    Reaction: Hallucinations     Zantac [Ranitidine Hcl] Diarrhea and Nausea Only    Night sweats    Family History: Family History  Problem Relation Age of Onset   Diabetes Mellitus II Mother    Hypothyroidism Mother    Hypertension Mother    Hypertension Father    Gout Father    Cancer Paternal Aunt    Cancer Maternal Grandfather    Diabetes Maternal Grandfather     Social History:  reports that he has been smoking cigarettes. He has a 16.50 pack-year smoking history. He has quit using smokeless tobacco. He reports current alcohol use. He reports that he does not use drugs.   Physical Exam: BP (!) 154/107    Pulse 101    Ht 6' (1.829 m)    Wt (!) 241 lb 4.8 oz (109.5 kg)    BMI 32.73 kg/m   Constitutional:  Well nourished. Alert and oriented, No acute distress. HEENT: [place] AT, moist mucus membranes.  Trachea midline Cardiovascular: No clubbing, cyanosis, or edema. Respiratory: Normal respiratory effort, no increased work of breathing.    GU:  Normal Penis. Testes descended bilaterally. Prostate is 30 grams, smooth, without nodules.    Skin: No rashes, bruises or suspicious lesions. Lymph: No inguinal adenopathy. Neurologic: Grossly intact, no focal deficits, moving all 4 extremities. Psychiatric: Normal mood and affect.   Assessment & Plan:    1. Erectile Dysfunction - Most likely secondary to arterial insufficiency - With minimal erections, unlikely PDE-5 inhibitors will be beneficial  -We discussed intracavernosal injections and vacuum erection devices; he would like to think over  -He was interested in a trial or oral medication and will check with his cardiologist to make sure there are no contraindications  2. Prostate Cancer Screening -He would like to proceed with the required screening  -PSA was ordered -DRE today was normal  [place] [address], [place], [place] [address] [phone]  I, Joneen Boers Peace, am acting as a Education administrator for Dr. Nicki Reaper C. Ennis Delpozo.  I have reviewed the above documentation for accuracy and completeness, and I agree with the above.    Abbie Sons, MD

## 2020-03-16 ENCOUNTER — Ambulatory Visit: Payer: Medicaid Other | Admitting: Gerontology

## 2020-03-16 ENCOUNTER — Telehealth: Payer: Self-pay | Admitting: *Deleted

## 2020-03-16 ENCOUNTER — Encounter: Payer: Self-pay | Admitting: Gerontology

## 2020-03-16 ENCOUNTER — Other Ambulatory Visit: Payer: Self-pay

## 2020-03-16 VITALS — BP 140/101 | HR 100 | Ht 72.0 in | Wt 241.0 lb

## 2020-03-16 DIAGNOSIS — E1169 Type 2 diabetes mellitus with other specified complication: Secondary | ICD-10-CM

## 2020-03-16 DIAGNOSIS — E785 Hyperlipidemia, unspecified: Secondary | ICD-10-CM

## 2020-03-16 DIAGNOSIS — I10 Essential (primary) hypertension: Secondary | ICD-10-CM

## 2020-03-16 LAB — PSA: Prostate Specific Ag, Serum: 1.1 ng/mL (ref 0.0–4.0)

## 2020-03-16 MED ORDER — INSULIN LISPRO (1 UNIT DIAL) 100 UNIT/ML (KWIKPEN): 3.0000 [IU] | PEN_INJECTOR | Freq: Three times a day (TID) | SUBCUTANEOUS | 5 refills | Status: DC

## 2020-03-16 MED ORDER — BASAGLAR KWIKPEN 100 UNIT/ML ~~LOC~~ SOPN: 28.0000 [IU] | PEN_INJECTOR | Freq: Every evening | SUBCUTANEOUS | 3 refills | Status: DC

## 2020-03-16 NOTE — Telephone Encounter (Signed)
Notified patient as instructed, patient pleased. Discussed follow-up appointments, patient agrees  

## 2020-03-16 NOTE — Telephone Encounter (Signed)
-----   Message from Abbie Sons, MD sent at 03/16/2020 12:18 PM EDT ----- PSA normal at 1.1

## 2020-03-16 NOTE — Progress Notes (Signed)
Established Patient Office Visit  Subjective:  Patient ID: Gabriel [name]., male    DOB: 12-19-1970  Age: 49 y.o. MRN: [id number]  CC:  Chief Complaint  Patient presents with  . Diabetes    HPI Gabriel [name]. presents for follow up of  Type 2 Diabetes Mellitus and hypertension. He states that he's compliant with his medications,and continues to make healthy life style modifications. He states that [facility] has sent financial application for his [name] and it will take 6-8 weeks before he receives it. His HgbA1c done on 03/08/2020 was 9.7%, he brought his blood glucose log during visit, his fasting readings were between 150-200 mg/dl and evening readings were between 233- 400 mg/dl. He denies hypoglycemic symptoms, peripheral neuropathy, and endorses polyuria, polydipsia and polyphagia. He states that he doesn't check his blood pressure at home, but continues to adhere to DASH diet and exercises as tolerated. He was seen by the Urologist Dr Bernardo Heater C on 03/15/2020 with regards to erectile dysfunction. Overall, he states that he's doing well and offers no further complaint.  Past Medical History:  Diagnosis Date  . Chest wall pain, chronic   . Chronic systolic CHF (congestive heart failure) ([place])    a. 03/2015 Echo: EF 45-50%; b. 12/2015 Echo: EF 20-25%; c. 02/2016 Echo: EF 30-35%; d. 11/2016 Echo: EF 40-45%; e. 06/2019 Echo: EF 30-35%.  . Chronic Troponin Elevation   . CKD (chronic kidney disease), stage III   . COPD (chronic obstructive pulmonary disease) ([place])   . Diabetes mellitus without complication ([place])   . Hypertension    Resolved since weight loss  . NICM (nonischemic cardiomyopathy) ([place])    a. 03/2015 Echo: EF 45-50%; b. 04/2015 MV: small defect of mild severity in apex 2/2 apical thinning, EF 30-44%;  c. 12/2015 Echo: EF 20-25%; d. 02/2016 Echo: EF 30-35%; e. 11/2016 Echo: EF 40-45%, diff HK, mildly dil Ao root, mild MR, mod dil LA;  f. 12/2016 Cath: LM nl,  LAD/Diags/LCX/OMs min irregs, RCA 40p/m/d; g. 06/2019 Echo: EF 30-35%, glob HK. Mod dil LA. RVSP 58.4 mmHg.  . Non-obstructive CAD (coronary artery disease)    a. 04/2015 low risk MV;  b. 12/2016 Cath: minor irregs in LAD/Diag/LCX/OM, RCA 40p/m/d.  [name] (nonsustained ventricular tachycardia) ([place])    a. 12/2015 noted on tele-->amio;  b. 12/2015 Event monitor: no VT. Atach noted.  . Obesity (BMI 30.0-34.9)   . Psoriasis   . Renal insufficiency   . Syncope    a. 01/2016 - felt to be vasovagal.  . Tobacco abuse     Past Surgical History:  Procedure Laterality Date  . AMPUTATION    . CARDIAC CATHETERIZATION    . FINGER AMPUTATION     Traumatic  . FINGER FRACTURE SURGERY Left   . LEFT HEART CATH AND CORONARY ANGIOGRAPHY N/A 01/06/2017   Procedure: Left Heart Cath and Coronary Angiography;  Surgeon: Wellington Hampshire, MD;  Location: [facility];  Service: Cardiovascular;  Laterality: N/A;    Family History  Problem Relation Age of Onset  . Diabetes Mellitus II Mother   . Hypothyroidism Mother   . Hypertension Mother   . Hypertension Father   . Gout Father   . Cancer Paternal Aunt   . Cancer Maternal Grandfather   . Diabetes Maternal Grandfather     Social History   Socioeconomic History  . Marital status: Widowed    Spouse name: Not on file  . Number of  children: 1  . Years of education: 59  . Highest education level: 12th grade  Occupational History  . Occupation: disabled  Tobacco Use  . Smoking status: Current Some Day Smoker    Packs/day: 0.50    Years: 33.00    Pack years: 16.50    Types: Cigarettes    Last attempt to quit: 08/18/2019    Years since quitting: 0.5  . Smokeless tobacco: Former Network engineer  . Vaping Use: Former  Substance and Sexual Activity  . Alcohol use: Yes    Alcohol/week: 0.0 standard drinks    Comment: occassionally  . Drug use: No  . Sexual activity: Not Currently  Other Topics Concern  . Not on file  Social History  Narrative      Social Determinants of Health   Financial Resource Strain:   . Difficulty of Paying Living Expenses:   Food Insecurity:   . Worried About Charity fundraiser in the Last Year:   . Arboriculturist in the Last Year:   Transportation Needs:   . Film/video editor (Medical):   Marland Kitchen Lack of Transportation (Non-Medical):   Physical Activity:   . Days of Exercise per Week:   . Minutes of Exercise per Session:   Stress:   . Feeling of Stress :   Social Connections:   . Frequency of Communication with Friends and Family:   . Frequency of Social Gatherings with Friends and Family:   . Attends Religious Services:   . Active Member of Clubs or Organizations:   . Attends Archivist Meetings:   Marland Kitchen Marital Status:   Intimate Partner Violence:   . Fear of Current or Ex-Partner:   . Emotionally Abused:   Marland Kitchen Physically Abused:   . Sexually Abused:     Outpatient Medications Prior to Visit  Medication Sig Dispense Refill  . apixaban (ELIQUIS) 5 MG TABS tablet Take 1 tablet (5 mg total) by mouth 2 (two) times daily. 180 tablet 3  . blood glucose meter kit and supplies KIT Dispense based on patient and insurance preference. Use up to four times daily as directed. (FOR ICD-9 250.00, 250.01). 1 each 3  . carvedilol (COREG) 25 MG tablet Take 1 tablet (25 mg total) by mouth 2 (two) times daily with a meal. 60 tablet 2  . cloNIDine (CATAPRES) 0.1 MG tablet Take 0.5 tablets (0.05 mg total) by mouth 2 (two) times daily. Takes 1/2 tablet twice daily 30 tablet 2  . fenofibrate (TRICOR) 145 MG tablet Take 1 tablet (145 mg total) by mouth daily. 30 tablet 6  . Insulin Pen Needle 29G X 5MM MISC 1 Dose by Does not apply route 4 (four) times daily -  with meals and at bedtime. 200 each 0  . Ipratropium-Albuterol (COMBIVENT) 20-100 MCG/ACT AERS respimat Inhale 1 puff into the lungs every 6 (six) hours. 4 g 0  . nitroGLYCERIN (NITROSTAT) 0.4 MG SL tablet Place 1 tablet (0.4 mg total)  under the tongue every 5 (five) minutes x 3 doses as needed for chest pain. 30 tablet 0  . rosuvastatin (CRESTOR) 20 MG tablet Take 1 tablet (20 mg total) by mouth at bedtime. 30 tablet 6  . sacubitril-valsartan (ENTRESTO) 49-51 MG Take 1 tablet by mouth 2 (two) times daily. 180 tablet 3  . spironolactone (ALDACTONE) 25 MG tablet Take 1 tablet (25 mg total) by mouth 2 (two) times daily. 60 tablet 2  . torsemide (DEMADEX) 20 MG tablet Take  2 tablets (40 mg total) by mouth 2 (two) times daily. 360 tablet 3  . Insulin Glargine (BASAGLAR KWIKPEN) 100 UNIT/ML Inject 0.24 mLs (24 Units total) into the skin at bedtime. 1 pen 0  . insulin lispro (HUMALOG) 100 UNIT/ML KwikPen Inject 0.03 mLs (3 Units total) into the skin 3 (three) times daily before meals. 1 pen 0   No facility-administered medications prior to visit.    Allergies  Allergen Reactions  . Metformin And Related Nausea And Vomiting  . Prednisone Other (See Comments)    Reaction: Hallucinations    . Zantac [Ranitidine Hcl] Diarrhea and Nausea Only    Night sweats    ROS Review of Systems  Constitutional: Negative.   Respiratory: Negative.   Cardiovascular: Negative.   Endocrine: Positive for polydipsia, polyphagia and polyuria.  Skin: Negative.   Neurological: Negative.   Psychiatric/Behavioral: Negative.       Objective:    Physical Exam HENT:     Head: Normocephalic and atraumatic.     Mouth/Throat:     Mouth: Mucous membranes are moist.  Eyes:     Extraocular Movements: Extraocular movements intact.     Pupils: Pupils are equal, round, and reactive to light.  Cardiovascular:     Rate and Rhythm: Normal rate and regular rhythm.     Pulses: Normal pulses.     Heart sounds: Normal heart sounds.  Pulmonary:     Effort: Pulmonary effort is normal.     Breath sounds: Normal breath sounds.  Abdominal:     General: Bowel sounds are normal.     Palpations: Abdomen is soft.  Skin:    General: Skin is warm.   Neurological:     General: No focal deficit present.     Mental Status: He is alert and oriented to person, place, and time. Mental status is at baseline.  Psychiatric:        Mood and Affect: Mood normal.        Behavior: Behavior normal.        Thought Content: Thought content normal.        Judgment: Judgment normal.     BP (!) 140/101 (BP Location: Left Arm, Patient Position: Sitting)   Pulse 100   Ht 6' (1.829 m)   Wt (!) 241 lb (109.3 kg)   SpO2 98%   BMI 32.69 kg/m  Wt Readings from Last 3 Encounters:  03/16/20 (!) 241 lb (109.3 kg)  03/15/20 (!) 241 lb 4.8 oz (109.5 kg)  03/08/20 244 lb 14.4 oz (111.1 kg)   He was encouraged to continue on his weight loss regimen.  Health Maintenance Due  Topic Date Due  . OPHTHALMOLOGY EXAM  Never done  . COVID-19 Vaccine (1) Never done  . TETANUS/TDAP  Never done  . COLONOSCOPY  Never done    There are no preventive care reminders to display for this patient.  Lab Results  Component Value Date   TSH 1.740 09/09/2018    Lab Results  Component Value Date   WBC 7.8 03/08/2020   HGB 15.5 03/08/2020   HCT 46.3 03/08/2020   MCV 91 03/08/2020   PLT 180 03/08/2020   Lab Results  Component Value Date   NA 140 03/08/2020   K 4.1 03/08/2020   CO2 23 03/08/2020   GLUCOSE 386 (H) 03/08/2020   BUN 20 03/08/2020   CREATININE 1.21 03/08/2020   BILITOT 1.0 12/27/2019   ALKPHOS 89 12/27/2019   AST 22 12/27/2019  ALT 59 (H) 12/27/2019   PROT 6.6 12/27/2019   ALBUMIN 3.3 (L) 12/27/2019   CALCIUM 9.6 03/08/2020   ANIONGAP 12 01/18/2020   Lab Results  Component Value Date   CHOL 160 03/08/2020   Lab Results  Component Value Date   HDL 23 (L) 03/08/2020   Lab Results  Component Value Date   LDLCALC 42 03/08/2020   Lab Results  Component Value Date   TRIG 672 ([place]) 03/08/2020   Lab Results  Component Value Date   CHOLHDL 7.0 (H) 03/08/2020   Lab Results  Component Value Date   HGBA1C 9.7 (H) 03/08/2020       Assessment & Plan:   1. Type 2 diabetes mellitus with hyperlipidemia (HCC) - His HgbA1c was 9.7%, his goal should be less than 7%. His Diabetes is not under control, his Insulin Glargine was increased to 28 units, he was advised to check his blood glucose tid, record, bring log to F/U appointment, and take 3 units of Insulin Lispro with meals. He was advised that his fasting blood glucose should be between 80-130 mg/dl, continue on low carb/ non concentrated sweet diet and exercise as tolerated. - Insulin Glargine (BASAGLAR KWIKPEN) 100 UNIT/ML; Inject 0.28 mLs (28 Units total) into the skin at bedtime.  Dispense: 1 pen; Refill: 3 - insulin lispro (HUMALOG) 100 UNIT/ML KwikPen; Inject 0.03 mLs (3 Units total) into the skin 3 (three) times daily before meals.  Dispense: 1 pen; Refill: 5  2. Essential hypertension - His blood pressure is not under control, his goal should be less than 130/80. He will continue on current treatment regimen and follow up with Cardiologist Dr Rockey Situ on 8/2/42021. He was advised to , check his blood pressure at Pharmacy, record and bring log to follow up appointment, continue on DASH diet and exercise as tolerated.     Follow-up: Return in about 4 weeks (around 04/13/2020), or if symptoms worsen or fail to improve.    Ethelean Colla Jerold Coombe, NP

## 2020-03-16 NOTE — Patient Instructions (Addendum)
Carbohydrate Counting for Diabetes Mellitus, Adult  Carbohydrate counting is a method of keeping track of how many carbohydrates you eat. Eating carbohydrates naturally increases the amount of sugar (glucose) in the blood. Counting how many carbohydrates you eat helps keep your blood glucose within normal limits, which helps you manage your diabetes (diabetes mellitus). It is important to know how many carbohydrates you can safely have in each meal. This is different for every person. A diet and nutrition specialist (registered dietitian) can help you make a meal plan and calculate how many carbohydrates you should have at each meal and snack. Carbohydrates are found in the following foods:  Grains, such as breads and cereals.  Dried beans and soy products.  Starchy vegetables, such as potatoes, peas, and corn.  Fruit and fruit juices.  Milk and yogurt.  Sweets and snack foods, such as cake, cookies, candy, chips, and soft drinks. How do I count carbohydrates? There are two ways to count carbohydrates in food. You can use either of the methods or a combination of both. Reading "Nutrition Facts" on packaged food The "Nutrition Facts" list is included on the labels of almost all packaged foods and beverages in the [place] It includes:  The serving size.  Information about nutrients in each serving, including the grams (g) of carbohydrate per serving. To use the "Nutrition Facts":  Decide how many servings you will have.  Multiply the number of servings by the number of carbohydrates per serving.  The resulting number is the total amount of carbohydrates that you will be having. Learning standard serving sizes of other foods When you eat carbohydrate foods that are not packaged or do not include "Nutrition Facts" on the label, you need to measure the servings in order to count the amount of carbohydrates:  Measure the foods that you will eat with a food scale or measuring cup, if needed.   Decide how many standard-size servings you will eat.  Multiply the number of servings by 15. Most carbohydrate-rich foods have about 15 g of carbohydrates per serving. ? For example, if you eat 8 oz (170 g) of strawberries, you will have eaten 2 servings and 30 g of carbohydrates (2 servings x 15 g = 30 g).  For foods that have more than one food mixed, such as soups and casseroles, you must count the carbohydrates in each food that is included. The following list contains standard serving sizes of common carbohydrate-rich foods. Each of these servings has about 15 g of carbohydrates:   hamburger bun or  English muffin.   oz (15 mL) syrup.   oz (14 g) jelly.  1 slice of bread.  1 six-inch tortilla.  3 oz (85 g) cooked rice or pasta.  4 oz (113 g) cooked dried beans.  4 oz (113 g) starchy vegetable, such as peas, corn, or potatoes.  4 oz (113 g) hot cereal.  4 oz (113 g) mashed potatoes or  of a large baked potato.  4 oz (113 g) canned or frozen fruit.  4 oz (120 mL) fruit juice.  4-6 crackers.  6 chicken nuggets.  6 oz (170 g) unsweetened dry cereal.  6 oz (170 g) plain fat-free yogurt or yogurt sweetened with artificial sweeteners.  8 oz (240 mL) milk.  8 oz (170 g) fresh fruit or one small piece of fruit.  24 oz (680 g) popped popcorn. Example of carbohydrate counting Sample meal  3 oz (85 g) chicken breast.  6 oz (170 g)   brown rice.  4 oz (113 g) corn.  8 oz (240 mL) milk.  8 oz (170 g) strawberries with sugar-free whipped topping. Carbohydrate calculation 1. Identify the foods that contain carbohydrates: ? Rice. ? Corn. ? Milk. ? Strawberries. 2. Calculate how many servings you have of each food: ? 2 servings rice. ? 1 serving corn. ? 1 serving milk. ? 1 serving strawberries. 3. Multiply each number of servings by 15 g: ? 2 servings rice x 15 g = 30 g. ? 1 serving corn x 15 g = 15 g. ? 1 serving milk x 15 g = 15 g. ? 1 serving  strawberries x 15 g = 15 g. 4. Add together all of the amounts to find the total grams of carbohydrates eaten: ? 30 g + 15 g + 15 g + 15 g = 75 g of carbohydrates total. Summary  Carbohydrate counting is a method of keeping track of how many carbohydrates you eat.  Eating carbohydrates naturally increases the amount of sugar (glucose) in the blood.  Counting how many carbohydrates you eat helps keep your blood glucose within normal limits, which helps you manage your diabetes.  A diet and nutrition specialist (registered dietitian) can help you make a meal plan and calculate how many carbohydrates you should have at each meal and snack. This information is not intended to replace advice given to you by your health care provider. Make sure you discuss any questions you have with your health care provider. Document Revised: 02/27/2017 Document Reviewed: 01/17/2016 Elsevier Patient Education  2020 Elsevier Inc. DASH Eating Plan DASH stands for "Dietary Approaches to Stop Hypertension." The DASH eating plan is a healthy eating plan that has been shown to reduce high blood pressure (hypertension). It may also reduce your risk for type 2 diabetes, heart disease, and stroke. The DASH eating plan may also help with weight loss. What are tips for following this plan?  General guidelines  Avoid eating more than 2,300 mg (milligrams) of salt (sodium) a day. If you have hypertension, you may need to reduce your sodium intake to 1,500 mg a day.  Limit alcohol intake to no more than 1 drink a day for nonpregnant women and 2 drinks a day for men. One drink equals 12 oz of beer, 5 oz of wine, or 1 oz of hard liquor.  Work with your health care provider to maintain a healthy body weight or to lose weight. Ask what an ideal weight is for you.  Get at least 30 minutes of exercise that causes your heart to beat faster (aerobic exercise) most days of the week. Activities may include walking, swimming, or  biking.  Work with your health care provider or diet and nutrition specialist (dietitian) to adjust your eating plan to your individual calorie needs. Reading food labels   Check food labels for the amount of sodium per serving. Choose foods with less than 5 percent of the Daily Value of sodium. Generally, foods with less than 300 mg of sodium per serving fit into this eating plan.  To find whole grains, look for the word "whole" as the first word in the ingredient list. Shopping  Buy products labeled as "low-sodium" or "no salt added."  Buy fresh foods. Avoid canned foods and premade or frozen meals. Cooking  Avoid adding salt when cooking. Use salt-free seasonings or herbs instead of table salt or sea salt. Check with your health care provider or pharmacist before using salt substitutes.  Do not   fry foods. Cook foods using healthy methods such as baking, boiling, grilling, and broiling instead.  Cook with heart-healthy oils, such as olive, canola, soybean, or sunflower oil. Meal planning  Eat a balanced diet that includes: ? 5 or more servings of fruits and vegetables each day. At each meal, try to fill half of your plate with fruits and vegetables. ? Up to 6-8 servings of whole grains each day. ? Less than 6 oz of lean meat, poultry, or fish each day. A 3-oz serving of meat is about the same size as a deck of cards. One egg equals 1 oz. ? 2 servings of low-fat dairy each day. ? A serving of nuts, seeds, or beans 5 times each week. ? Heart-healthy fats. Healthy fats called Omega-3 fatty acids are found in foods such as flaxseeds and coldwater fish, like sardines, salmon, and mackerel.  Limit how much you eat of the following: ? Canned or prepackaged foods. ? Food that is high in trans fat, such as fried foods. ? Food that is high in saturated fat, such as fatty meat. ? Sweets, desserts, sugary drinks, and other foods with added sugar. ? Full-fat dairy products.  Do not salt  foods before eating.  Try to eat at least 2 vegetarian meals each week.  Eat more home-cooked food and less restaurant, buffet, and fast food.  When eating at a restaurant, ask that your food be prepared with less salt or no salt, if possible. What foods are recommended? The items listed may not be a complete list. Talk with your dietitian about what dietary choices are best for you. Grains Whole-grain or whole-wheat bread. Whole-grain or whole-wheat pasta. Brown rice. Oatmeal. Quinoa. Bulgur. Whole-grain and low-sodium cereals. Pita bread. Low-fat, low-sodium crackers. Whole-wheat flour tortillas. Vegetables Fresh or frozen vegetables (raw, steamed, roasted, or grilled). Low-sodium or reduced-sodium tomato and vegetable juice. Low-sodium or reduced-sodium tomato sauce and tomato paste. Low-sodium or reduced-sodium canned vegetables. Fruits All fresh, dried, or frozen fruit. Canned fruit in natural juice (without added sugar). Meat and other protein foods Skinless chicken or turkey. Ground chicken or turkey. Pork with fat trimmed off. Fish and seafood. Egg whites. Dried beans, peas, or lentils. Unsalted nuts, nut butters, and seeds. Unsalted canned beans. Lean cuts of beef with fat trimmed off. Low-sodium, lean deli meat. Dairy Low-fat (1%) or fat-free (skim) milk. Fat-free, low-fat, or reduced-fat cheeses. Nonfat, low-sodium ricotta or cottage cheese. Low-fat or nonfat yogurt. Low-fat, low-sodium cheese. Fats and oils Soft margarine without trans fats. Vegetable oil. Low-fat, reduced-fat, or light mayonnaise and salad dressings (reduced-sodium). Canola, safflower, olive, soybean, and sunflower oils. Avocado. Seasoning and other foods Herbs. Spices. Seasoning mixes without salt. Unsalted popcorn and pretzels. Fat-free sweets. What foods are not recommended? The items listed may not be a complete list. Talk with your dietitian about what dietary choices are best for you. Grains Baked goods  made with fat, such as croissants, muffins, or some breads. Dry pasta or rice meal packs. Vegetables Creamed or fried vegetables. Vegetables in a cheese sauce. Regular canned vegetables (not low-sodium or reduced-sodium). Regular canned tomato sauce and paste (not low-sodium or reduced-sodium). Regular tomato and vegetable juice (not low-sodium or reduced-sodium). Pickles. Olives. Fruits Canned fruit in a light or heavy syrup. Fried fruit. Fruit in cream or butter sauce. Meat and other protein foods Fatty cuts of meat. Ribs. Fried meat. Bacon. Sausage. Bologna and other processed lunch meats. Salami. Fatback. Hotdogs. Bratwurst. Salted nuts and seeds. Canned beans with added salt.   Canned or smoked fish. Whole eggs or egg yolks. Chicken or turkey with skin. Dairy Whole or 2% milk, cream, and half-and-half. Whole or full-fat cream cheese. Whole-fat or sweetened yogurt. Full-fat cheese. Nondairy creamers. Whipped toppings. Processed cheese and cheese spreads. Fats and oils Butter. Stick margarine. Lard. Shortening. Ghee. Bacon fat. Tropical oils, such as coconut, palm kernel, or palm oil. Seasoning and other foods Salted popcorn and pretzels. Onion salt, garlic salt, seasoned salt, table salt, and sea salt. Worcestershire sauce. Tartar sauce. Barbecue sauce. Teriyaki sauce. Soy sauce, including reduced-sodium. Steak sauce. Canned and packaged gravies. Fish sauce. Oyster sauce. Cocktail sauce. Horseradish that you find on the shelf. Ketchup. Mustard. Meat flavorings and tenderizers. Bouillon cubes. Hot sauce and Tabasco sauce. Premade or packaged marinades. Premade or packaged taco seasonings. Relishes. Regular salad dressings. Where to find more information:  National Heart, Lung, and Blood Institute: www.nhlbi.nih.gov  American Heart Association: www.heart.org Summary  The DASH eating plan is a healthy eating plan that has been shown to reduce high blood pressure (hypertension). It may also reduce  your risk for type 2 diabetes, heart disease, and stroke.  With the DASH eating plan, you should limit salt (sodium) intake to 2,300 mg a day. If you have hypertension, you may need to reduce your sodium intake to 1,500 mg a day.  When on the DASH eating plan, aim to eat more fresh fruits and vegetables, whole grains, lean proteins, low-fat dairy, and heart-healthy fats.  Work with your health care provider or diet and nutrition specialist (dietitian) to adjust your eating plan to your individual calorie needs. This information is not intended to replace advice given to you by your health care provider. Make sure you discuss any questions you have with your health care provider. Document Revised: 07/18/2017 Document Reviewed: 07/29/2016 Elsevier Patient Education  2020 Elsevier Inc.  

## 2020-03-21 ENCOUNTER — Telehealth: Payer: Self-pay

## 2020-03-21 NOTE — Telephone Encounter (Signed)
I received message from [place] at [facility] regarding patients eligibility.  Patient works at [employer].  I notified patient today that we will need 1 month pay stubs and 2020 taxes. Patient continues to say he is not working although employment has been verified.   Patient states he did not file taxes in 2020. I explained to patient that he will need to go onto [email or web address] website and print out tax transcript.  At this point, patient hung up on me.  Patient has appt at [facility] 04/13/20. This will be patients last appt if eligibility information has not been returned.

## 2020-04-11 ENCOUNTER — Ambulatory Visit: Payer: Self-pay | Admitting: Cardiovascular Disease

## 2020-04-13 ENCOUNTER — Ambulatory Visit: Payer: Medicaid Other | Admitting: Gerontology

## 2020-04-13 ENCOUNTER — Telehealth: Payer: Self-pay | Admitting: Pharmacy Technician

## 2020-04-13 ENCOUNTER — Other Ambulatory Visit: Payer: Self-pay

## 2020-04-13 DIAGNOSIS — E785 Hyperlipidemia, unspecified: Secondary | ICD-10-CM

## 2020-04-13 DIAGNOSIS — E1169 Type 2 diabetes mellitus with other specified complication: Secondary | ICD-10-CM

## 2020-04-13 DIAGNOSIS — I10 Essential (primary) hypertension: Secondary | ICD-10-CM

## 2020-04-13 NOTE — Patient Instructions (Signed)
DASH Eating Plan DASH stands for "Dietary Approaches to Stop Hypertension." The DASH eating plan is a healthy eating plan that has been shown to reduce high blood pressure (hypertension). It may also reduce your risk for type 2 diabetes, heart disease, and stroke. The DASH eating plan may also help with weight loss. What are tips for following this plan?  General guidelines  Avoid eating more than 2,300 mg (milligrams) of salt (sodium) a day. If you have hypertension, you may need to reduce your sodium intake to 1,500 mg a day.  Limit alcohol intake to no more than 1 drink a day for nonpregnant women and 2 drinks a day for men. One drink equals 12 oz of beer, 5 oz of wine, or 1 oz of hard liquor.  Work with your health care provider to maintain a healthy body weight or to lose weight. Ask what an ideal weight is for you.  Get at least 30 minutes of exercise that causes your heart to beat faster (aerobic exercise) most days of the week. Activities may include walking, swimming, or biking.  Work with your health care provider or diet and nutrition specialist (dietitian) to adjust your eating plan to your individual calorie needs. Reading food labels   Check food labels for the amount of sodium per serving. Choose foods with less than 5 percent of the Daily Value of sodium. Generally, foods with less than 300 mg of sodium per serving fit into this eating plan.  To find whole grains, look for the word "whole" as the first word in the ingredient list. Shopping  Buy products labeled as "low-sodium" or "no salt added."  Buy fresh foods. Avoid canned foods and premade or frozen meals. Cooking  Avoid adding salt when cooking. Use salt-free seasonings or herbs instead of table salt or sea salt. Check with your health care provider or pharmacist before using salt substitutes.  Do not fry foods. Cook foods using healthy methods such as baking, boiling, grilling, and broiling instead.  Cook with  heart-healthy oils, such as olive, canola, soybean, or sunflower oil. Meal planning  Eat a balanced diet that includes: ? 5 or more servings of fruits and vegetables each day. At each meal, try to fill half of your plate with fruits and vegetables. ? Up to 6-8 servings of whole grains each day. ? Less than 6 oz of lean meat, poultry, or fish each day. A 3-oz serving of meat is about the same size as a deck of cards. One egg equals 1 oz. ? 2 servings of low-fat dairy each day. ? A serving of nuts, seeds, or beans 5 times each week. ? Heart-healthy fats. Healthy fats called Omega-3 fatty acids are found in foods such as flaxseeds and coldwater fish, like sardines, salmon, and mackerel.  Limit how much you eat of the following: ? Canned or prepackaged foods. ? Food that is high in trans fat, such as fried foods. ? Food that is high in saturated fat, such as fatty meat. ? Sweets, desserts, sugary drinks, and other foods with added sugar. ? Full-fat dairy products.  Do not salt foods before eating.  Try to eat at least 2 vegetarian meals each week.  Eat more home-cooked food and less restaurant, buffet, and fast food.  When eating at a restaurant, ask that your food be prepared with less salt or no salt, if possible. What foods are recommended? The items listed may not be a complete list. Talk with your dietitian about   what dietary choices are best for you. Grains Whole-grain or whole-wheat bread. Whole-grain or whole-wheat pasta. Brown rice. Oatmeal. Quinoa. Bulgur. Whole-grain and low-sodium cereals. Pita bread. Low-fat, low-sodium crackers. Whole-wheat flour tortillas. Vegetables Fresh or frozen vegetables (raw, steamed, roasted, or grilled). Low-sodium or reduced-sodium tomato and vegetable juice. Low-sodium or reduced-sodium tomato sauce and tomato paste. Low-sodium or reduced-sodium canned vegetables. Fruits All fresh, dried, or frozen fruit. Canned fruit in natural juice (without  added sugar). Meat and other protein foods Skinless chicken or turkey. Ground chicken or turkey. Pork with fat trimmed off. Fish and seafood. Egg whites. Dried beans, peas, or lentils. Unsalted nuts, nut butters, and seeds. Unsalted canned beans. Lean cuts of beef with fat trimmed off. Low-sodium, lean deli meat. Dairy Low-fat (1%) or fat-free (skim) milk. Fat-free, low-fat, or reduced-fat cheeses. Nonfat, low-sodium ricotta or cottage cheese. Low-fat or nonfat yogurt. Low-fat, low-sodium cheese. Fats and oils Soft margarine without trans fats. Vegetable oil. Low-fat, reduced-fat, or light mayonnaise and salad dressings (reduced-sodium). Canola, safflower, olive, soybean, and sunflower oils. Avocado. Seasoning and other foods Herbs. Spices. Seasoning mixes without salt. Unsalted popcorn and pretzels. Fat-free sweets. What foods are not recommended? The items listed may not be a complete list. Talk with your dietitian about what dietary choices are best for you. Grains Baked goods made with fat, such as croissants, muffins, or some breads. Dry pasta or rice meal packs. Vegetables Creamed or fried vegetables. Vegetables in a cheese sauce. Regular canned vegetables (not low-sodium or reduced-sodium). Regular canned tomato sauce and paste (not low-sodium or reduced-sodium). Regular tomato and vegetable juice (not low-sodium or reduced-sodium). Pickles. Olives. Fruits Canned fruit in a light or heavy syrup. Fried fruit. Fruit in cream or butter sauce. Meat and other protein foods Fatty cuts of meat. Ribs. Fried meat. Bacon. Sausage. Bologna and other processed lunch meats. Salami. Fatback. Hotdogs. Bratwurst. Salted nuts and seeds. Canned beans with added salt. Canned or smoked fish. Whole eggs or egg yolks. Chicken or turkey with skin. Dairy Whole or 2% milk, cream, and half-and-half. Whole or full-fat cream cheese. Whole-fat or sweetened yogurt. Full-fat cheese. Nondairy creamers. Whipped toppings.  Processed cheese and cheese spreads. Fats and oils Butter. Stick margarine. Lard. Shortening. Ghee. Bacon fat. Tropical oils, such as coconut, palm kernel, or palm oil. Seasoning and other foods Salted popcorn and pretzels. Onion salt, garlic salt, seasoned salt, table salt, and sea salt. Worcestershire sauce. Tartar sauce. Barbecue sauce. Teriyaki sauce. Soy sauce, including reduced-sodium. Steak sauce. Canned and packaged gravies. Fish sauce. Oyster sauce. Cocktail sauce. Horseradish that you find on the shelf. Ketchup. Mustard. Meat flavorings and tenderizers. Bouillon cubes. Hot sauce and Tabasco sauce. Premade or packaged marinades. Premade or packaged taco seasonings. Relishes. Regular salad dressings. Where to find more information:  National Heart, Lung, and Blood Institute: www.nhlbi.nih.gov  American Heart Association: www.heart.org Summary  The DASH eating plan is a healthy eating plan that has been shown to reduce high blood pressure (hypertension). It may also reduce your risk for type 2 diabetes, heart disease, and stroke.  With the DASH eating plan, you should limit salt (sodium) intake to 2,300 mg a day. If you have hypertension, you may need to reduce your sodium intake to 1,500 mg a day.  When on the DASH eating plan, aim to eat more fresh fruits and vegetables, whole grains, lean proteins, low-fat dairy, and heart-healthy fats.  Work with your health care provider or diet and nutrition specialist (dietitian) to adjust your eating plan to your   individual calorie needs. This information is not intended to replace advice given to you by your health care provider. Make sure you discuss any questions you have with your health care provider. Document Revised: 07/18/2017 Document Reviewed: 07/29/2016 Elsevier Patient Education  2020 Elsevier Inc. Carbohydrate Counting for Diabetes Mellitus, Adult  Carbohydrate counting is a method of keeping track of how many carbohydrates you  eat. Eating carbohydrates naturally increases the amount of sugar (glucose) in the blood. Counting how many carbohydrates you eat helps keep your blood glucose within normal limits, which helps you manage your diabetes (diabetes mellitus). It is important to know how many carbohydrates you can safely have in each meal. This is different for every person. A diet and nutrition specialist (registered dietitian) can help you make a meal plan and calculate how many carbohydrates you should have at each meal and snack. Carbohydrates are found in the following foods:  Grains, such as breads and cereals.  Dried beans and soy products.  Starchy vegetables, such as potatoes, peas, and corn.  Fruit and fruit juices.  Milk and yogurt.  Sweets and snack foods, such as cake, cookies, candy, chips, and soft drinks. How do I count carbohydrates? There are two ways to count carbohydrates in food. You can use either of the methods or a combination of both. Reading "Nutrition Facts" on packaged food The "Nutrition Facts" list is included on the labels of almost all packaged foods and beverages in the [place] It includes:  The serving size.  Information about nutrients in each serving, including the grams (g) of carbohydrate per serving. To use the "Nutrition Facts":  Decide how many servings you will have.  Multiply the number of servings by the number of carbohydrates per serving.  The resulting number is the total amount of carbohydrates that you will be having. Learning standard serving sizes of other foods When you eat carbohydrate foods that are not packaged or do not include "Nutrition Facts" on the label, you need to measure the servings in order to count the amount of carbohydrates:  Measure the foods that you will eat with a food scale or measuring cup, if needed.  Decide how many standard-size servings you will eat.  Multiply the number of servings by 15. Most carbohydrate-rich foods have  about 15 g of carbohydrates per serving. ? For example, if you eat 8 oz (170 g) of strawberries, you will have eaten 2 servings and 30 g of carbohydrates (2 servings x 15 g = 30 g).  For foods that have more than one food mixed, such as soups and casseroles, you must count the carbohydrates in each food that is included. The following list contains standard serving sizes of common carbohydrate-rich foods. Each of these servings has about 15 g of carbohydrates:   hamburger bun or  English muffin.   oz (15 mL) syrup.   oz (14 g) jelly.  1 slice of bread.  1 six-inch tortilla.  3 oz (85 g) cooked rice or pasta.  4 oz (113 g) cooked dried beans.  4 oz (113 g) starchy vegetable, such as peas, corn, or potatoes.  4 oz (113 g) hot cereal.  4 oz (113 g) mashed potatoes or  of a large baked potato.  4 oz (113 g) canned or frozen fruit.  4 oz (120 mL) fruit juice.  4-6 crackers.  6 chicken nuggets.  6 oz (170 g) unsweetened dry cereal.  6 oz (170 g) plain fat-free yogurt or yogurt sweetened with   artificial sweeteners.  8 oz (240 mL) milk.  8 oz (170 g) fresh fruit or one small piece of fruit.  24 oz (680 g) popped popcorn. Example of carbohydrate counting Sample meal  3 oz (85 g) chicken breast.  6 oz (170 g) brown rice.  4 oz (113 g) corn.  8 oz (240 mL) milk.  8 oz (170 g) strawberries with sugar-free whipped topping. Carbohydrate calculation 1. Identify the foods that contain carbohydrates: ? Rice. ? Corn. ? Milk. ? Strawberries. 2. Calculate how many servings you have of each food: ? 2 servings rice. ? 1 serving corn. ? 1 serving milk. ? 1 serving strawberries. 3. Multiply each number of servings by 15 g: ? 2 servings rice x 15 g = 30 g. ? 1 serving corn x 15 g = 15 g. ? 1 serving milk x 15 g = 15 g. ? 1 serving strawberries x 15 g = 15 g. 4. Add together all of the amounts to find the total grams of carbohydrates eaten: ? 30 g + 15 g + 15 g + 15  g = 75 g of carbohydrates total. Summary  Carbohydrate counting is a method of keeping track of how many carbohydrates you eat.  Eating carbohydrates naturally increases the amount of sugar (glucose) in the blood.  Counting how many carbohydrates you eat helps keep your blood glucose within normal limits, which helps you manage your diabetes.  A diet and nutrition specialist (registered dietitian) can help you make a meal plan and calculate how many carbohydrates you should have at each meal and snack. This information is not intended to replace advice given to you by your health care provider. Make sure you discuss any questions you have with your health care provider. Document Revised: 02/27/2017 Document Reviewed: 01/17/2016 Elsevier Patient Education  2020 Elsevier Inc.  

## 2020-04-13 NOTE — Telephone Encounter (Signed)
Patient has prescription drug coverage with [place].  No longer meets [facility]'s eligibility criteria.  Pt notified.  [facility]   [place], [place]  [address]  April 13, 2020    Mr. Gabriel, [name] [address] [place], [place]  [address]  Dear [name],  This is to inform you that you are no longer eligible to receive medication assistance at [facility].  The reason(s) are:    _____Your total gross monthly household income exceeds 250% of the Federal Poverty Level.   _____Tangible assets (savings, checking, stocks/bonds, pension, retirement, etc.) exceeds our limit  _____You are eligible to receive benefits from [facility], [facility] or HIV Medication            Assistance program _____You are eligible to receive benefits from a Medicare Part D plan __X__You have prescription insurance  _____You are not an [facility] resident _____Failure to provide all requested proof of income information for 2021.    We regret that we are unable to help you at this time.  If your prescription coverage is terminated, please contact [facility], so that we may reassess your eligibility for our program.  If you have questions, we may be contacted at [phone].  Thank you,  [facility]

## 2020-04-13 NOTE — Progress Notes (Signed)
Established Patient Office Visit  Subjective:  Patient ID: Gabriel [name]., male    DOB: 07-10-1971  Age: 49 y.o. MRN: [id number]  CC: No chief complaint on file.  Patient consents to telephone visit and 2 patient identifiers was used to identify patient.  HPI Gabriel [name]. presents for follow up of hypertension and Type 2 diabetes mellitus. He states that he's compliant with his medications and continues to make healthy lifestyle changes. His HgbA1c done on 03/08/2020 was 9.7%. He states that he checks his blood glucose twice daily and his fasting readings are usually in the 160's and evening readings in the low 200's. He denies hypoglycemic symptoms, and occasional polyuria and he performs daily foot checks. He states that he checks his blood pressure daily and per patient, it's in the 130's/80's. Overall, he states that he's doing well and offers no further complaint.  Past Medical History:  Diagnosis Date  . Chest wall pain, chronic   . Chronic systolic CHF (congestive heart failure) ([place])    a. 03/2015 Echo: EF 45-50%; b. 12/2015 Echo: EF 20-25%; c. 02/2016 Echo: EF 30-35%; d. 11/2016 Echo: EF 40-45%; e. 06/2019 Echo: EF 30-35%.  . Chronic Troponin Elevation   . CKD (chronic kidney disease), stage III   . COPD (chronic obstructive pulmonary disease) ([place])   . Diabetes mellitus without complication ([place])   . Hypertension    Resolved since weight loss  . NICM (nonischemic cardiomyopathy) ([place])    a. 03/2015 Echo: EF 45-50%; b. 04/2015 MV: small defect of mild severity in apex 2/2 apical thinning, EF 30-44%;  c. 12/2015 Echo: EF 20-25%; d. 02/2016 Echo: EF 30-35%; e. 11/2016 Echo: EF 40-45%, diff HK, mildly dil Ao root, mild MR, mod dil LA;  f. 12/2016 Cath: LM nl, LAD/Diags/LCX/OMs min irregs, RCA 40p/m/d; g. 06/2019 Echo: EF 30-35%, glob HK. Mod dil LA. RVSP 58.4 mmHg.  . Non-obstructive CAD (coronary artery disease)    a. 04/2015 low risk MV;  b. 12/2016 Cath: minor irregs in  LAD/Diag/LCX/OM, RCA 40p/m/d.  [name] (nonsustained ventricular tachycardia) ([place])    a. 12/2015 noted on tele-->amio;  b. 12/2015 Event monitor: no VT. Atach noted.  . Obesity (BMI 30.0-34.9)   . Psoriasis   . Renal insufficiency   . Syncope    a. 01/2016 - felt to be vasovagal.  . Tobacco abuse     Past Surgical History:  Procedure Laterality Date  . AMPUTATION    . CARDIAC CATHETERIZATION    . FINGER AMPUTATION     Traumatic  . FINGER FRACTURE SURGERY Left   . LEFT HEART CATH AND CORONARY ANGIOGRAPHY N/A 01/06/2017   Procedure: Left Heart Cath and Coronary Angiography;  Surgeon: Wellington Hampshire, MD;  Location: [facility];  Service: Cardiovascular;  Laterality: N/A;    Family History  Problem Relation Age of Onset  . Diabetes Mellitus II Mother   . Hypothyroidism Mother   . Hypertension Mother   . Hypertension Father   . Gout Father   . Cancer Paternal Aunt   . Cancer Maternal Grandfather   . Diabetes Maternal Grandfather     Social History   Socioeconomic History  . Marital status: Widowed    Spouse name: Not on file  . Number of children: 1  . Years of education: 79  . Highest education level: 12th grade  Occupational History  . Occupation: disabled  Tobacco Use  . Smoking status: Current Some Day Smoker  Packs/day: 0.50    Years: 33.00    Pack years: 16.50    Types: Cigarettes    Last attempt to quit: 08/18/2019    Years since quitting: 0.6  . Smokeless tobacco: Former Network engineer  . Vaping Use: Former  Substance and Sexual Activity  . Alcohol use: Yes    Alcohol/week: 0.0 standard drinks    Comment: occassionally  . Drug use: No  . Sexual activity: Not Currently  Other Topics Concern  . Not on file  Social History Narrative      Social Determinants of Health   Financial Resource Strain:   . Difficulty of Paying Living Expenses: Not on file  Food Insecurity:   . Worried About Charity fundraiser in the Last Year: Not on file   . Ran Out of Food in the Last Year: Not on file  Transportation Needs:   . Lack of Transportation (Medical): Not on file  . Lack of Transportation (Non-Medical): Not on file  Physical Activity:   . Days of Exercise per Week: Not on file  . Minutes of Exercise per Session: Not on file  Stress:   . Feeling of Stress : Not on file  Social Connections:   . Frequency of Communication with Friends and Family: Not on file  . Frequency of Social Gatherings with Friends and Family: Not on file  . Attends Religious Services: Not on file  . Active Member of Clubs or Organizations: Not on file  . Attends Archivist Meetings: Not on file  . Marital Status: Not on file  Intimate Partner Violence:   . Fear of Current or Ex-Partner: Not on file  . Emotionally Abused: Not on file  . Physically Abused: Not on file  . Sexually Abused: Not on file    Outpatient Medications Prior to Visit  Medication Sig Dispense Refill  . apixaban (ELIQUIS) 5 MG TABS tablet Take 1 tablet (5 mg total) by mouth 2 (two) times daily. 180 tablet 3  . blood glucose meter kit and supplies KIT Dispense based on patient and insurance preference. Use up to four times daily as directed. (FOR ICD-9 250.00, 250.01). 1 each 3  . carvedilol (COREG) 25 MG tablet Take 1 tablet (25 mg total) by mouth 2 (two) times daily with a meal. 60 tablet 2  . cloNIDine (CATAPRES) 0.1 MG tablet Take 0.5 tablets (0.05 mg total) by mouth 2 (two) times daily. Takes 1/2 tablet twice daily 30 tablet 2  . fenofibrate (TRICOR) 145 MG tablet Take 1 tablet (145 mg total) by mouth daily. 30 tablet 6  . Insulin Glargine (BASAGLAR KWIKPEN) 100 UNIT/ML Inject 0.28 mLs (28 Units total) into the skin at bedtime. 1 pen 3  . insulin lispro (HUMALOG) 100 UNIT/ML KwikPen Inject 0.03 mLs (3 Units total) into the skin 3 (three) times daily before meals. 1 pen 5  . Insulin Pen Needle 29G X 5MM MISC 1 Dose by Does not apply route 4 (four) times daily -  with  meals and at bedtime. 200 each 0  . Ipratropium-Albuterol (COMBIVENT) 20-100 MCG/ACT AERS respimat Inhale 1 puff into the lungs every 6 (six) hours. 4 g 0  . nitroGLYCERIN (NITROSTAT) 0.4 MG SL tablet Place 1 tablet (0.4 mg total) under the tongue every 5 (five) minutes x 3 doses as needed for chest pain. 30 tablet 0  . rosuvastatin (CRESTOR) 20 MG tablet Take 1 tablet (20 mg total) by mouth at bedtime. 30 tablet 6  .  sacubitril-valsartan (ENTRESTO) 49-51 MG Take 1 tablet by mouth 2 (two) times daily. 180 tablet 3  . spironolactone (ALDACTONE) 25 MG tablet Take 1 tablet (25 mg total) by mouth 2 (two) times daily. 60 tablet 2  . torsemide (DEMADEX) 20 MG tablet Take 2 tablets (40 mg total) by mouth 2 (two) times daily. 360 tablet 3   No facility-administered medications prior to visit.    Allergies  Allergen Reactions  . Metformin And Related Nausea And Vomiting  . Prednisone Other (See Comments)    Reaction: Hallucinations    . Zantac [Ranitidine Hcl] Diarrhea and Nausea Only    Night sweats    ROS Review of Systems  Constitutional: Negative.   Respiratory: Negative.   Cardiovascular: Negative.   Endocrine: Positive for polyuria.  Skin: Negative.   Neurological: Negative.       Objective:    Physical Exam No physical exam was done There were no vitals taken for this visit. Wt Readings from Last 3 Encounters:  03/16/20 (!) 241 lb (109.3 kg)  03/15/20 (!) 241 lb 4.8 oz (109.5 kg)  03/08/20 244 lb 14.4 oz (111.1 kg)     Health Maintenance Due  Topic Date Due  . OPHTHALMOLOGY EXAM  Never done  . URINE MICROALBUMIN  Never done  . COVID-19 Vaccine (1) Never done  . TETANUS/TDAP  Never done  . COLONOSCOPY  Never done  . INFLUENZA VACCINE  03/19/2020    There are no preventive care reminders to display for this patient.  Lab Results  Component Value Date   TSH 1.740 09/09/2018   Lab Results  Component Value Date   WBC 7.8 03/08/2020   HGB 15.5 03/08/2020   HCT  46.3 03/08/2020   MCV 91 03/08/2020   PLT 180 03/08/2020   Lab Results  Component Value Date   NA 140 03/08/2020   K 4.1 03/08/2020   CO2 23 03/08/2020   GLUCOSE 386 (H) 03/08/2020   BUN 20 03/08/2020   CREATININE 1.21 03/08/2020   BILITOT 1.0 12/27/2019   ALKPHOS 89 12/27/2019   AST 22 12/27/2019   ALT 59 (H) 12/27/2019   PROT 6.6 12/27/2019   ALBUMIN 3.3 (L) 12/27/2019   CALCIUM 9.6 03/08/2020   ANIONGAP 12 01/18/2020   Lab Results  Component Value Date   CHOL 160 03/08/2020   Lab Results  Component Value Date   HDL 23 (L) 03/08/2020   Lab Results  Component Value Date   LDLCALC 42 03/08/2020   Lab Results  Component Value Date   TRIG 672 (HH) 03/08/2020   Lab Results  Component Value Date   CHOLHDL 7.0 (H) 03/08/2020   Lab Results  Component Value Date   HGBA1C 9.7 (H) 03/08/2020      Assessment & Plan:   1. Essential hypertension - Unable to check his blood pressure due to virtual visit, he was advised to continue on current medication regimen. -Low salt DASH diet -Take medications regularly on time -Exercise regularly as tolerated -Check blood pressure at least once a week at home or a nearby pharmacy, record and bring log to follow up appointment. -Goal is less than 140/90 and normal blood pressure is 120/80    2. Type 2 diabetes mellitus with hyperlipidemia (H[facility]) - His HgbA1c was 9.7%, his goal should be less than 7%. He will continue on current treatment regimen, advised to check blood glucose tid, record and bring log to follow up appointment. His fasting reading should be between 80-130 mg/dl. -  He was advised to continue on low carb/non concentrated sweet diet, and exercise as tolerated. - HgB A1c; Future - Urine Microalbumin w/creat. ratio; Future     Follow-up: Return in about 8 weeks (around 06/07/2020), or if symptoms worsen or fail to improve. This visit was his last, because he has health care insurance coverage. [facility] clinic wishes  him well.   Collin Rengel Jerold Coombe, NP

## 2020-04-20 ENCOUNTER — Ambulatory Visit: Payer: Self-pay | Admitting: Nurse Practitioner

## 2020-04-20 ENCOUNTER — Encounter: Payer: Self-pay | Admitting: Nurse Practitioner

## 2020-04-20 NOTE — Progress Notes (Deleted)
Office Visit    Patient Name: Gabriel [name]. Date of Encounter: 04/20/2020  Primary Care Provider:  Patient, No Pcp Per Primary Cardiologist:  [name], MD  Chief Complaint    49 year old male with a history of chronic chest pain, chronically elevated troponin, nonischemic cardiomyopathy, hypertension, tobacco abuse, remote drug use, COPD, stage II-III chronic kidney disease, syncope, nonsustained VT, and nonobstructive CAD, who presents for follow-up related to heart failure.  Past Medical History    Past Medical History:  Diagnosis Date  . Chest wall pain, chronic   . Chronic systolic CHF (congestive heart failure) ([place])    a. 03/2015 Echo: EF 45-50%; b. 12/2015 Echo: EF 20-25%; c. 02/2016 Echo: EF 30-35%; d. 11/2016 Echo: EF 40-45%; e. 06/2019 Echo: EF 30-35%; f. 4/42021 Echo: EF 25-30%.  . Chronic Troponin Elevation   . CKD (chronic kidney disease), stage II-III   . COPD (chronic obstructive pulmonary disease) ([place])   . Diabetes mellitus without complication ([place])   . Hypertension    Resolved since weight loss  . NICM (nonischemic cardiomyopathy) ([place])    a. 03/2015 Echo: EF 45-50%; b. 12/2015 Echo: EF 20-25%; c. 02/2016 Echo: EF 30-35%; d. 11/2016 Echo: EF 40-45%; e. 06/2019 Echo: EF 30-35%; f. 11/2019 Echo: EF 25-30%, glob HK. Mild LVH. Gr2 DD. Mild-mod AI. BAE. Triv MR.  . Non-obstructive CAD (coronary artery disease)    a. 04/2015 low risk MV;  b. 12/2016 Cath: minor irregs in LAD/Diag/LCX/OM, RCA 40p/m/d.  [name] (nonsustained ventricular tachycardia) ([place])    a. 12/2015 noted on tele-->amio;  b. 12/2015 Event monitor: no VT. Atach noted.  . Obesity (BMI 30.0-34.9)   . Psoriasis   . Syncope    a. 01/2016 - felt to be vasovagal.  . Tobacco abuse    Past Surgical History:  Procedure Laterality Date  . AMPUTATION    . CARDIAC CATHETERIZATION    . FINGER AMPUTATION     Traumatic  . FINGER FRACTURE SURGERY Left   . LEFT HEART CATH AND CORONARY ANGIOGRAPHY N/A  01/06/2017   Procedure: Left Heart Cath and Coronary Angiography;  Surgeon: Wellington Hampshire, MD;  Location: [facility] [facility];  Service: Cardiovascular;  Laterality: N/A;    Allergies  Allergies  Allergen Reactions  . Metformin And Related Nausea And Vomiting  . Prednisone Other (See Comments)    Reaction: Hallucinations    . Zantac [Ranitidine Hcl] Diarrhea and Nausea Only    Night sweats    History of Present Illness    49 year old male with above complex past medical history including nonischemic cardiomyopathy (EF 25-30% April 2021), nonobstructive CAD (cath May 2018), hypertension, tobacco abuse, COPD, obesity, syncope (felt to be vasovagal June 2017), nonsustained VT (noted on telemetry May 2017-previously on amiodarone-follow-up monitoring did not show VTE), stage II-III chronic kidney disease, chronic chest wall pain, and chronic mild troponin elevations.  He has had multiple hospitalizations for chest pain and dyspnea dating back to August 2016, with mild persistent troponin elevations.  Previously had a low risk stress test in September 2016 and subsequently underwent diagnostic catheterization May 2018 revealing 40% diffuse RCA disease with otherwise normal coronary arteries.  In the setting of persistent nonischemic cardiomyopathy despite guideline directed medical therapy, he was evaluated by electrophysiology in January 2021 with recommendation for ICD placement however, patient deferred.  In May of this year, he was admitted with dyspnea and found to have lower extremity DVT as well as PE.  He is  now on Eliquis.  He was evaluated in advanced heart failure clinic in June of this year and transition to [facility] with plan for repeat echo versus cardiac MRI in 6 months.  Home Medications    Prior to Admission medications   Medication Sig Start Date End Date Taking? Authorizing Provider  apixaban (ELIQUIS) 5 MG TABS tablet Take 1 tablet (5 mg total) by mouth 2 (two) times daily.  01/05/20   Minna Merritts, MD  blood glucose meter kit and supplies KIT Dispense based on patient and insurance preference. Use up to four times daily as directed. (FOR ICD-9 250.00, 250.01). 03/02/20   Iloabachie, Chioma E, NP  carvedilol (COREG) 25 MG tablet Take 1 tablet (25 mg total) by mouth 2 (two) times daily with a meal. 03/08/20 04/07/20  Iloabachie, Chioma E, NP  cloNIDine (CATAPRES) 0.1 MG tablet Take 0.5 tablets (0.05 mg total) by mouth 2 (two) times daily. Takes 1/2 tablet twice daily 03/08/20 04/07/20  Iloabachie, Chioma E, NP  fenofibrate (TRICOR) 145 MG tablet Take 1 tablet (145 mg total) by mouth daily. 01/21/20   Larey Dresser, MD  Insulin Glargine [facility]) 100 UNIT/ML Inject 0.28 mLs (28 Units total) into the skin at bedtime. 03/16/20   Iloabachie, Chioma E, NP  insulin lispro (HUMALOG) 100 UNIT/ML KwikPen Inject 0.03 mLs (3 Units total) into the skin 3 (three) times daily before meals. 03/16/20   Iloabachie, Chioma E, NP  Insulin Pen Needle 29G X 5MM MISC 1 Dose by Does not apply route 4 (four) times daily -  with meals and at bedtime. 01/02/20   Wieting, Richard, MD  Ipratropium-Albuterol (COMBIVENT) 20-100 MCG/ACT AERS respimat Inhale 1 puff into the lungs every 6 (six) hours. 01/02/20   Loletha Grayer, MD  nitroGLYCERIN (NITROSTAT) 0.4 MG SL tablet Place 1 tablet (0.4 mg total) under the tongue every 5 (five) minutes x 3 doses as needed for chest pain. 06/27/19   [name], MD  rosuvastatin (CRESTOR) 20 MG tablet Take 1 tablet (20 mg total) by mouth at bedtime. 01/21/20   Larey Dresser, MD  sacubitril-valsartan (ENTRESTO) 49-51 MG Take 1 tablet by mouth 2 (two) times daily. 03/07/20   Larey Dresser, MD  spironolactone (ALDACTONE) 25 MG tablet Take 1 tablet (25 mg total) by mouth 2 (two) times daily. 03/08/20 04/07/20  Iloabachie, Chioma E, NP  torsemide (DEMADEX) 20 MG tablet Take 2 tablets (40 mg total) by mouth 2 (two) times daily. 03/08/20   Iloabachie, Chioma E, NP     Review of Systems    ***.  All other systems reviewed and are otherwise negative except as noted above.  Physical Exam    VS:  There were no vitals taken for this visit. , BMI There is no height or weight on file to calculate BMI. GEN: Well nourished, well developed, in no acute distress. HEENT: normal. Neck: Supple, no JVD, carotid bruits, or masses. Cardiac: RRR, no murmurs, rubs, or gallops. No clubbing, cyanosis, edema.  Radials/DP/PT 2+ and equal bilaterally.  Respiratory:  Respirations regular and unlabored, clear to auscultation bilaterally. GI: Soft, nontender, nondistended, BS + x 4. MS: no deformity or atrophy. Skin: warm and dry, no rash. Neuro:  Strength and sensation are intact. Psych: Normal affect.  Accessory Clinical Findings    ECG personally reviewed by me today - *** - no acute changes.  Lab Results  Component Value Date   WBC 7.8 03/08/2020   HGB 15.5 03/08/2020   HCT 46.3  03/08/2020   MCV 91 03/08/2020   PLT 180 03/08/2020   Lab Results  Component Value Date   CREATININE 1.21 03/08/2020   BUN 20 03/08/2020   NA 140 03/08/2020   K 4.1 03/08/2020   CL 97 03/08/2020   CO2 23 03/08/2020   Lab Results  Component Value Date   ALT 59 (H) 12/27/2019   AST 22 12/27/2019   ALKPHOS 89 12/27/2019   BILITOT 1.0 12/27/2019   Lab Results  Component Value Date   CHOL 160 03/08/2020   HDL 23 (L) 03/08/2020   LDLCALC 42 03/08/2020   LDLDIRECT 65.3 01/18/2020   TRIG 672 (HH) 03/08/2020   CHOLHDL 7.0 (H) 03/08/2020    Lab Results  Component Value Date   HGBA1C 9.7 (H) 03/08/2020    Assessment & Plan    1.  ***   Murray Hodgkins, NP 04/20/2020, 7:48 AM

## 2020-04-21 ENCOUNTER — Encounter: Payer: Self-pay | Admitting: Nurse Practitioner

## 2020-05-04 ENCOUNTER — Encounter: Payer: Self-pay | Admitting: Emergency Medicine

## 2020-05-04 ENCOUNTER — Emergency Department: Payer: Medicaid Other

## 2020-05-04 DIAGNOSIS — Z5321 Procedure and treatment not carried out due to patient leaving prior to being seen by health care provider: Secondary | ICD-10-CM | POA: Diagnosis not present

## 2020-05-04 DIAGNOSIS — Z20822 Contact with and (suspected) exposure to covid-19: Secondary | ICD-10-CM | POA: Diagnosis not present

## 2020-05-04 DIAGNOSIS — R079 Chest pain, unspecified: Secondary | ICD-10-CM | POA: Insufficient documentation

## 2020-05-04 DIAGNOSIS — R0602 Shortness of breath: Secondary | ICD-10-CM | POA: Insufficient documentation

## 2020-05-04 LAB — CBC
HCT: 44.8 % (ref 39.0–52.0)
Hemoglobin: 15.1 g/dL (ref 13.0–17.0)
MCH: 30 pg (ref 26.0–34.0)
MCHC: 33.7 g/dL (ref 30.0–36.0)
MCV: 88.9 fL (ref 80.0–100.0)
Platelets: 195 10*3/uL (ref 150–400)
RBC: 5.04 MIL/uL (ref 4.22–5.81)
RDW: 13 % (ref 11.5–15.5)
WBC: 8.6 10*3/uL (ref 4.0–10.5)
nRBC: 0 % (ref 0.0–0.2)

## 2020-05-04 LAB — BASIC METABOLIC PANEL
Anion gap: 13 (ref 5–15)
BUN: 20 mg/dL (ref 6–20)
CO2: 24 mmol/L (ref 22–32)
Calcium: 9.1 mg/dL (ref 8.9–10.3)
Chloride: 100 mmol/L (ref 98–111)
Creatinine, Ser: 1.13 mg/dL (ref 0.61–1.24)
GFR calc Af Amer: >60 mL/min (ref >60)
GFR calc non Af Amer: >60 mL/min (ref >60)
Glucose, Bld: 231 mg/dL — ABNORMAL HIGH (ref 70–99)
Potassium: 3.4 mmol/L — ABNORMAL LOW (ref 3.5–5.1)
Sodium: 137 mmol/L (ref 135–145)

## 2020-05-04 LAB — TROPONIN I (HIGH SENSITIVITY): Troponin I (High Sensitivity): 79 ng/L — ABNORMAL HIGH (ref <18)

## 2020-05-04 LAB — SARS CORONAVIRUS 2 BY RT PCR (HOSPITAL ORDER, PERFORMED IN ~~LOC~~ HOSPITAL LAB): SARS Coronavirus 2: NEGATIVE

## 2020-05-04 NOTE — ED Triage Notes (Signed)
Pt reports pain to left side of chest that radiated into left arm on Monday. Pain went away and then tonight sudden onset of same pain with SOB.

## 2020-05-05 ENCOUNTER — Emergency Department
Admission: EM | Admit: 2020-05-05 | Discharge: 2020-05-05 | Disposition: A | Discharge status: Home / Self Care | Payer: Medicaid Other | Attending: Emergency Medicine | Admitting: Emergency Medicine

## 2020-05-05 LAB — TROPONIN I (HIGH SENSITIVITY): Troponin I (High Sensitivity): 67 ng/L — ABNORMAL HIGH (ref <18)

## 2020-05-05 NOTE — ED Notes (Signed)
Per front desk, pt left ED after going to bathroom. Pt not in lobby and when called pt is a no answer.

## 2020-05-12 ENCOUNTER — Inpatient Hospital Stay
Admit: 2020-05-12 | Discharge: 2020-05-12 | Disposition: A | Discharge status: Home / Self Care | Payer: Medicaid Other | Attending: Internal Medicine | Admitting: Internal Medicine

## 2020-05-12 ENCOUNTER — Other Ambulatory Visit: Payer: Self-pay

## 2020-05-12 ENCOUNTER — Inpatient Hospital Stay
Admission: EM | Admit: 2020-05-12 | Discharge: 2020-05-14 | DRG: 291 | Disposition: A | Discharge status: Home / Self Care | Payer: Medicaid Other | Attending: Internal Medicine | Admitting: Internal Medicine

## 2020-05-12 ENCOUNTER — Emergency Department: Payer: Medicaid Other

## 2020-05-12 ENCOUNTER — Inpatient Hospital Stay: Payer: Medicaid Other

## 2020-05-12 DIAGNOSIS — Z794 Long term (current) use of insulin: Secondary | ICD-10-CM

## 2020-05-12 DIAGNOSIS — Z7901 Long term (current) use of anticoagulants: Secondary | ICD-10-CM

## 2020-05-12 DIAGNOSIS — I509 Heart failure, unspecified: Secondary | ICD-10-CM

## 2020-05-12 DIAGNOSIS — E1165 Type 2 diabetes mellitus with hyperglycemia: Secondary | ICD-10-CM | POA: Diagnosis present

## 2020-05-12 DIAGNOSIS — R9431 Abnormal electrocardiogram [ECG] [EKG]: Secondary | ICD-10-CM

## 2020-05-12 DIAGNOSIS — Z833 Family history of diabetes mellitus: Secondary | ICD-10-CM

## 2020-05-12 DIAGNOSIS — Z888 Allergy status to other drugs, medicaments and biological substances status: Secondary | ICD-10-CM | POA: Diagnosis not present

## 2020-05-12 DIAGNOSIS — Z20822 Contact with and (suspected) exposure to covid-19: Secondary | ICD-10-CM | POA: Diagnosis present

## 2020-05-12 DIAGNOSIS — I2510 Atherosclerotic heart disease of native coronary artery without angina pectoris: Secondary | ICD-10-CM | POA: Diagnosis present

## 2020-05-12 DIAGNOSIS — E1122 Type 2 diabetes mellitus with diabetic chronic kidney disease: Secondary | ICD-10-CM | POA: Diagnosis present

## 2020-05-12 DIAGNOSIS — E785 Hyperlipidemia, unspecified: Secondary | ICD-10-CM | POA: Diagnosis present

## 2020-05-12 DIAGNOSIS — E669 Obesity, unspecified: Secondary | ICD-10-CM | POA: Diagnosis present

## 2020-05-12 DIAGNOSIS — I771 Stricture of artery: Secondary | ICD-10-CM | POA: Diagnosis present

## 2020-05-12 DIAGNOSIS — I130 Hypertensive heart and chronic kidney disease with heart failure and stage 1 through stage 4 chronic kidney disease, or unspecified chronic kidney disease: Secondary | ICD-10-CM | POA: Diagnosis not present

## 2020-05-12 DIAGNOSIS — E876 Hypokalemia: Secondary | ICD-10-CM | POA: Diagnosis present

## 2020-05-12 DIAGNOSIS — R0789 Other chest pain: Secondary | ICD-10-CM

## 2020-05-12 DIAGNOSIS — I428 Other cardiomyopathies: Secondary | ICD-10-CM | POA: Diagnosis present

## 2020-05-12 DIAGNOSIS — I10 Essential (primary) hypertension: Secondary | ICD-10-CM | POA: Diagnosis not present

## 2020-05-12 DIAGNOSIS — R778 Other specified abnormalities of plasma proteins: Secondary | ICD-10-CM | POA: Diagnosis present

## 2020-05-12 DIAGNOSIS — Z86711 Personal history of pulmonary embolism: Secondary | ICD-10-CM

## 2020-05-12 DIAGNOSIS — N521 Erectile dysfunction due to diseases classified elsewhere: Secondary | ICD-10-CM | POA: Diagnosis present

## 2020-05-12 DIAGNOSIS — R079 Chest pain, unspecified: Secondary | ICD-10-CM

## 2020-05-12 DIAGNOSIS — I5023 Acute on chronic systolic (congestive) heart failure: Secondary | ICD-10-CM | POA: Diagnosis present

## 2020-05-12 DIAGNOSIS — Z79899 Other long term (current) drug therapy: Secondary | ICD-10-CM

## 2020-05-12 DIAGNOSIS — J449 Chronic obstructive pulmonary disease, unspecified: Secondary | ICD-10-CM | POA: Diagnosis present

## 2020-05-12 DIAGNOSIS — R7989 Other specified abnormal findings of blood chemistry: Secondary | ICD-10-CM | POA: Diagnosis present

## 2020-05-12 DIAGNOSIS — R0600 Dyspnea, unspecified: Secondary | ICD-10-CM

## 2020-05-12 DIAGNOSIS — N182 Chronic kidney disease, stage 2 (mild): Secondary | ICD-10-CM | POA: Diagnosis present

## 2020-05-12 DIAGNOSIS — F17210 Nicotine dependence, cigarettes, uncomplicated: Secondary | ICD-10-CM | POA: Diagnosis present

## 2020-05-12 LAB — RESPIRATORY PANEL BY RT PCR (FLU A&B, COVID)
Influenza A by PCR: NEGATIVE
Influenza B by PCR: NEGATIVE
SARS Coronavirus 2 by RT PCR: NEGATIVE

## 2020-05-12 LAB — BASIC METABOLIC PANEL
Anion gap: 11 (ref 5–15)
BUN: 17 mg/dL (ref 6–20)
CO2: 25 mmol/L (ref 22–32)
Calcium: 9 mg/dL (ref 8.9–10.3)
Chloride: 104 mmol/L (ref 98–111)
Creatinine, Ser: 1.11 mg/dL (ref 0.61–1.24)
GFR calc Af Amer: >60 mL/min (ref >60)
GFR calc non Af Amer: >60 mL/min (ref >60)
Glucose, Bld: 270 mg/dL — ABNORMAL HIGH (ref 70–99)
Potassium: 4.4 mmol/L (ref 3.5–5.1)
Sodium: 140 mmol/L (ref 135–145)

## 2020-05-12 LAB — GLUCOSE, CAPILLARY: Glucose-Capillary: 170 mg/dL — ABNORMAL HIGH (ref 70–99)

## 2020-05-12 LAB — BLOOD GAS, VENOUS
Acid-Base Excess: 2.8 mmol/L — ABNORMAL HIGH (ref 0.0–2.0)
Bicarbonate: 27.2 mmol/L (ref 20.0–28.0)
O2 Saturation: 79.6 %
Patient temperature: 37
pCO2, Ven: 40 mmHg — ABNORMAL LOW (ref 44.0–60.0)
pH, Ven: 7.44 — ABNORMAL HIGH (ref 7.250–7.430)
pO2, Ven: 42 mmHg (ref 32.0–45.0)

## 2020-05-12 LAB — CBC
HCT: 45.5 % (ref 39.0–52.0)
Hemoglobin: 15.1 g/dL (ref 13.0–17.0)
MCH: 29.4 pg (ref 26.0–34.0)
MCHC: 33.2 g/dL (ref 30.0–36.0)
MCV: 88.7 fL (ref 80.0–100.0)
Platelets: 189 10*3/uL (ref 150–400)
RBC: 5.13 MIL/uL (ref 4.22–5.81)
RDW: 13.1 % (ref 11.5–15.5)
WBC: 9.7 10*3/uL (ref 4.0–10.5)
nRBC: 0 % (ref 0.0–0.2)

## 2020-05-12 LAB — TROPONIN I (HIGH SENSITIVITY)
Troponin I (High Sensitivity): 64 ng/L — ABNORMAL HIGH (ref <18)
Troponin I (High Sensitivity): 74 ng/L — ABNORMAL HIGH (ref <18)

## 2020-05-12 LAB — BRAIN NATRIURETIC PEPTIDE: B Natriuretic Peptide: 1168.1 pg/mL — ABNORMAL HIGH (ref 0.0–100.0)

## 2020-05-12 LAB — FIBRIN DERIVATIVES D-DIMER (ARMC ONLY): Fibrin derivatives D-dimer (ARMC): 448.58 ng/mL (FEU) (ref 0.00–499.00)

## 2020-05-12 IMAGING — DX PORTABLE CHEST - 1 VIEW
1 series · 1 of 1 positions shown · non-contrast
Comparison: 11/16/2017

CLINICAL DATA: Left chest pain and cough for 2 days.

EXAM:
PORTABLE CHEST 1 VIEW

[chest ap]
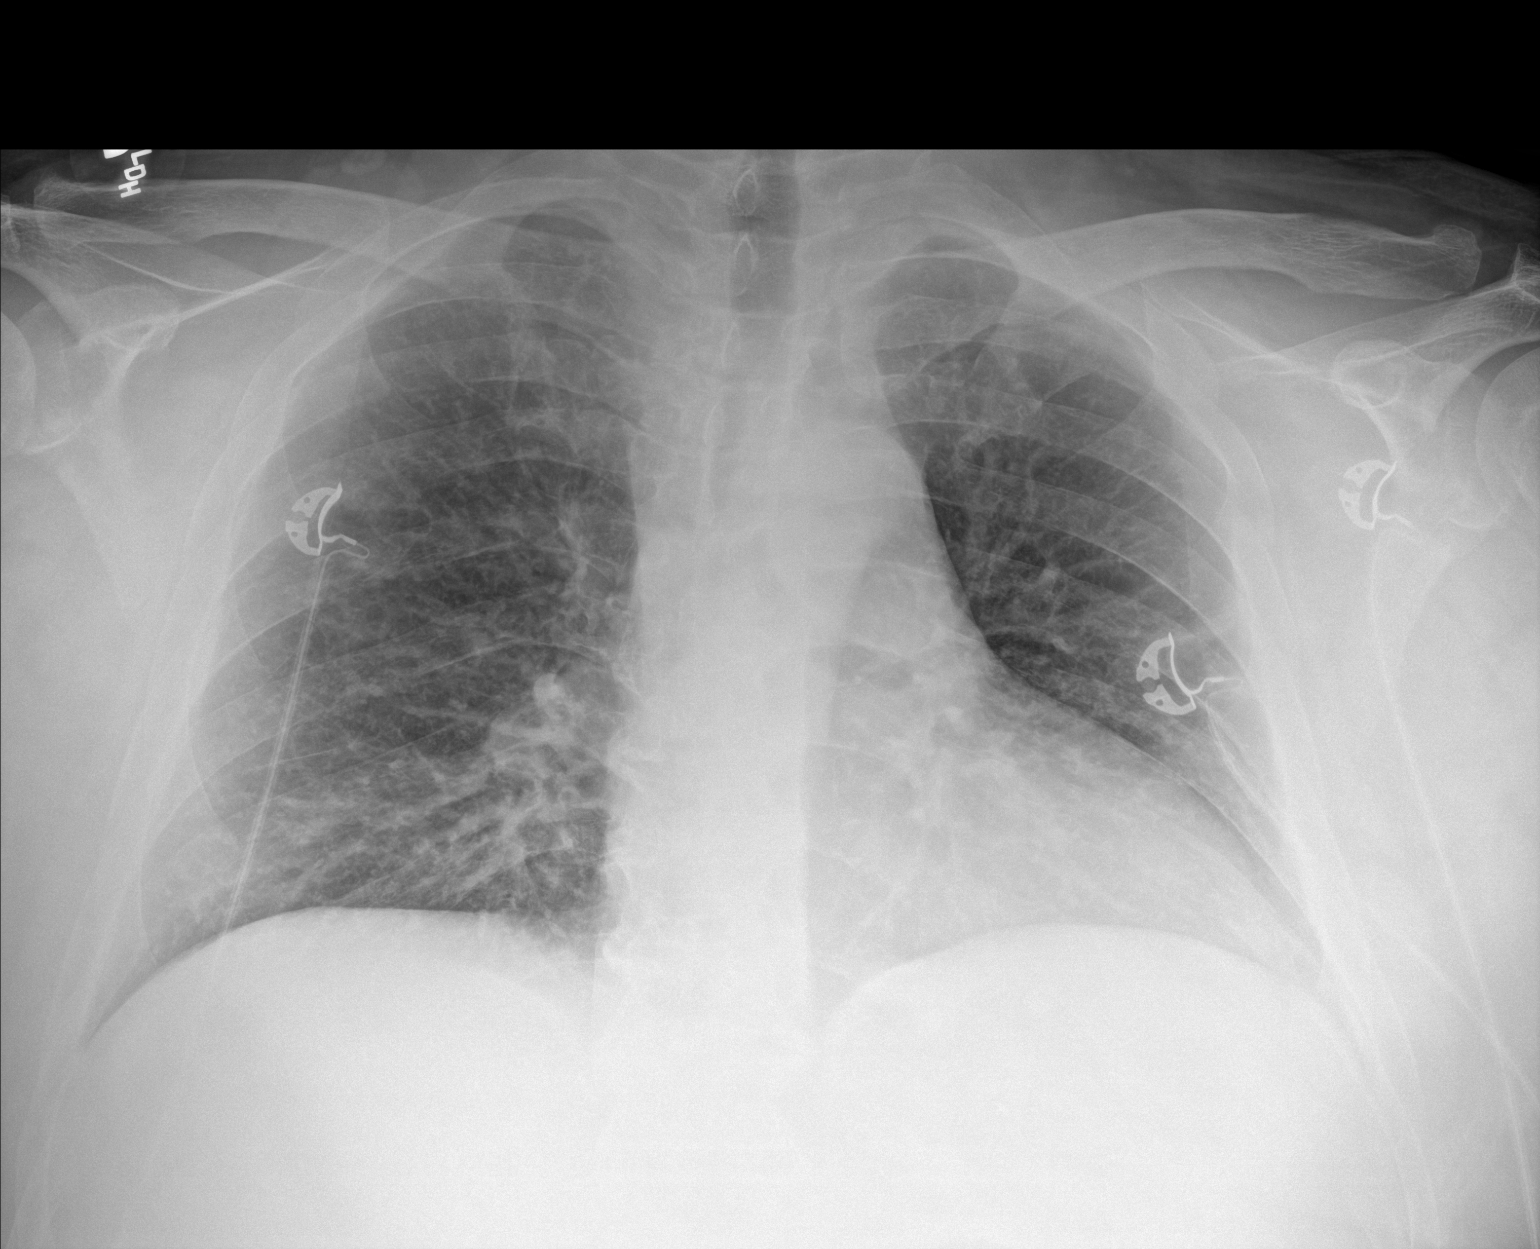

[1 of 1 positions shown; findings below may reference images not displayed]

FINDINGS: The cardiomediastinal silhouette is unchanged with normal heart
size. Chronic peribronchial thickening and interstitial coarsening
are similar to the prior study. No airspace consolidation, edema,
pleural effusion, or pneumothorax is identified. No acute osseous
abnormality is seen.
IMPRESSION: Chronic bronchitic changes without evidence of acute airspace
disease.

## 2020-05-12 MED ORDER — INSULIN ASPART 100 UNIT/ML ~~LOC~~ SOLN
0.0000 [IU] | Freq: Three times a day (TID) | SUBCUTANEOUS | Status: DC
  Administered 2020-05-12: 2 [IU] via SUBCUTANEOUS
  Administered 2020-05-13: 3 [IU] via SUBCUTANEOUS
  Administered 2020-05-13: 5 [IU] via SUBCUTANEOUS
  Administered 2020-05-13: 2 [IU] via SUBCUTANEOUS
  Administered 2020-05-14: 3 [IU] via SUBCUTANEOUS
  Filled 2020-05-12 (×5): qty 1

## 2020-05-12 MED ORDER — HYDROMORPHONE HCL 1 MG/ML IJ SOLN: 0.5000 mg | INTRAMUSCULAR | Status: DC | PRN

## 2020-05-12 MED ORDER — MORPHINE SULFATE (PF) 2 MG/ML IV SOLN
INTRAVENOUS | Status: AC
Start: 2020-05-12 — End: 2020-05-12
  Administered 2020-05-12: 2 mg via INTRAVENOUS
  Filled 2020-05-12: qty 1

## 2020-05-12 MED ORDER — ACETAMINOPHEN 325 MG PO TABS
650.0000 mg | ORAL_TABLET | Freq: Once | ORAL | Status: AC
  Administered 2020-05-12: 650 mg via ORAL
  Filled 2020-05-12: qty 2

## 2020-05-12 MED ORDER — CARVEDILOL 25 MG PO TABS
25.0000 mg | ORAL_TABLET | Freq: Two times a day (BID) | ORAL | Status: DC
  Administered 2020-05-12 – 2020-05-14 (×4): 25 mg via ORAL
  Filled 2020-05-12 (×4): qty 1

## 2020-05-12 MED ORDER — SODIUM CHLORIDE 0.9 % IV SOLN: 250.0000 mL | INTRAVENOUS | Status: DC | PRN

## 2020-05-12 MED ORDER — NITROGLYCERIN 2 % TD OINT
0.5000 [in_us] | TOPICAL_OINTMENT | Freq: Once | TRANSDERMAL | Status: AC
  Administered 2020-05-12: 0.5 [in_us] via TOPICAL

## 2020-05-12 MED ORDER — LISINOPRIL 5 MG PO TABS: 5.0000 mg | ORAL_TABLET | Freq: Every day | ORAL | Status: DC

## 2020-05-12 MED ORDER — ONDANSETRON HCL 4 MG/2ML IJ SOLN: 4.0000 mg | Freq: Four times a day (QID) | INTRAMUSCULAR | Status: DC | PRN

## 2020-05-12 MED ORDER — ROSUVASTATIN CALCIUM 10 MG PO TABS
20.0000 mg | ORAL_TABLET | Freq: Every day | ORAL | Status: DC
  Administered 2020-05-12 – 2020-05-13 (×2): 20 mg via ORAL
  Filled 2020-05-12: qty 2
  Filled 2020-05-12 (×2): qty 1

## 2020-05-12 MED ORDER — SODIUM CHLORIDE 0.9% FLUSH
3.0000 mL | Freq: Two times a day (BID) | INTRAVENOUS | Status: DC
  Administered 2020-05-13 – 2020-05-14 (×4): 3 mL via INTRAVENOUS

## 2020-05-12 MED ORDER — SPIRONOLACTONE 25 MG PO TABS
25.0000 mg | ORAL_TABLET | Freq: Two times a day (BID) | ORAL | Status: DC
  Administered 2020-05-12 – 2020-05-14 (×4): 25 mg via ORAL
  Filled 2020-05-12 (×8): qty 1

## 2020-05-12 MED ORDER — APIXABAN 5 MG PO TABS
5.0000 mg | ORAL_TABLET | Freq: Two times a day (BID) | ORAL | Status: DC
  Administered 2020-05-12 – 2020-05-14 (×4): 5 mg via ORAL
  Filled 2020-05-12 (×4): qty 1

## 2020-05-12 MED ORDER — INSULIN GLARGINE 100 UNIT/ML ~~LOC~~ SOLN
18.0000 [IU] | Freq: Every day | SUBCUTANEOUS | Status: DC
  Administered 2020-05-12: 18 [IU] via SUBCUTANEOUS
  Filled 2020-05-12 (×2): qty 0.18

## 2020-05-12 MED ORDER — SODIUM CHLORIDE 0.9% FLUSH: 3.0000 mL | INTRAVENOUS | Status: DC | PRN

## 2020-05-12 MED ORDER — IPRATROPIUM-ALBUTEROL 20-100 MCG/ACT IN AERS
1.0000 | INHALATION_SPRAY | Freq: Four times a day (QID) | RESPIRATORY_TRACT | Status: DC
  Administered 2020-05-13 – 2020-05-14 (×7): 1 via RESPIRATORY_TRACT
  Filled 2020-05-12: qty 4

## 2020-05-12 MED ORDER — FUROSEMIDE 10 MG/ML IJ SOLN
40.0000 mg | Freq: Two times a day (BID) | INTRAMUSCULAR | Status: DC
  Administered 2020-05-12 – 2020-05-14 (×4): 40 mg via INTRAVENOUS
  Filled 2020-05-12 (×4): qty 4

## 2020-05-12 MED ORDER — CARVEDILOL 6.25 MG PO TABS: 3.1250 mg | ORAL_TABLET | Freq: Two times a day (BID) | ORAL | Status: DC

## 2020-05-12 MED ORDER — HYDROMORPHONE HCL 1 MG/ML IJ SOLN
0.5000 mg | INTRAMUSCULAR | Status: DC | PRN
  Administered 2020-05-12: 0.5 mg via INTRAVENOUS
  Filled 2020-05-12: qty 1

## 2020-05-12 MED ORDER — SACUBITRIL-VALSARTAN 49-51 MG PO TABS
1.0000 | ORAL_TABLET | Freq: Two times a day (BID) | ORAL | Status: DC
  Administered 2020-05-12 – 2020-05-14 (×4): 1 via ORAL
  Filled 2020-05-12 (×5): qty 1

## 2020-05-12 MED ORDER — SODIUM CHLORIDE 0.9 % IV SOLN: Freq: Once | INTRAVENOUS | Status: DC

## 2020-05-12 MED ORDER — NITROGLYCERIN 2 % TD OINT
0.5000 [in_us] | TOPICAL_OINTMENT | Freq: Once | TRANSDERMAL | Status: DC
  Filled 2020-05-12: qty 1

## 2020-05-12 MED ORDER — NITROGLYCERIN 2 % TD OINT
0.5000 [in_us] | TOPICAL_OINTMENT | Freq: Once | TRANSDERMAL | Status: AC
  Administered 2020-05-12: 0.5 [in_us] via TOPICAL
  Filled 2020-05-12: qty 1

## 2020-05-12 MED ORDER — ACETAMINOPHEN 325 MG PO TABS: 650.0000 mg | ORAL_TABLET | ORAL | Status: DC | PRN

## 2020-05-12 MED ORDER — IOHEXOL 350 MG/ML SOLN
100.0000 mL | Freq: Once | INTRAVENOUS | Status: AC | PRN
  Administered 2020-05-12: 100 mL via INTRAVENOUS

## 2020-05-12 MED ORDER — MORPHINE SULFATE (PF) 2 MG/ML IV SOLN: 2.0000 mg | INTRAVENOUS | Status: DC | PRN

## 2020-05-12 MED ORDER — FUROSEMIDE 10 MG/ML IJ SOLN
60.0000 mg | Freq: Once | INTRAMUSCULAR | Status: AC
  Administered 2020-05-12: 60 mg via INTRAVENOUS
  Filled 2020-05-12: qty 8

## 2020-05-12 MED ORDER — ASPIRIN EC 81 MG PO TBEC
81.0000 mg | DELAYED_RELEASE_TABLET | Freq: Every day | ORAL | Status: DC
  Administered 2020-05-12: 81 mg via ORAL
  Filled 2020-05-12 (×3): qty 1

## 2020-05-12 MED ORDER — CLONIDINE HCL 0.1 MG PO TABS
0.0500 mg | ORAL_TABLET | Freq: Two times a day (BID) | ORAL | Status: DC
  Administered 2020-05-12 – 2020-05-13 (×2): 0.05 mg via ORAL
  Filled 2020-05-12 (×2): qty 1

## 2020-05-12 MED ORDER — LIDOCAINE 5 % EX PTCH
1.0000 | MEDICATED_PATCH | CUTANEOUS | Status: DC
  Administered 2020-05-12 – 2020-05-13 (×2): 1 via TRANSDERMAL
  Filled 2020-05-12 (×3): qty 1

## 2020-05-12 NOTE — Consult Note (Addendum)
ANTICOAGULATION CONSULT NOTE - Follow Up Consult  Pharmacy Consult for Eliquis dosing  Indication: DVT history  Allergies  Allergen Reactions  . Metformin And Related Nausea And Vomiting  . Prednisone Other (See Comments)    Reaction: Hallucinations    . Zantac [Ranitidine Hcl] Diarrhea and Nausea Only    Night sweats    Patient Measurements: Height: 6' (182.9 cm) Weight: 113.4 kg (250 lb) IBW/kg (Calculated) : 77.6   Vital Signs: Temp: 97.6 F (36.4 C) (09/24 1224) Temp Source: Oral (09/24 1224) BP: 166/110 (09/24 1635) Pulse Rate: 89 (09/24 1635)  Labs: Recent Labs    05/12/20 1227 05/12/20 1508  HGB 15.1  --   HCT 45.5  --   PLT 189  --   CREATININE 1.11  --   TROPONINIHS 64* 74*    Estimated Creatinine Clearance: 102.3 mL/min (by C-G formula based on SCr of 1.11 mg/dL).   Medications:  Patient is on Eliquis 5 mg BID for DVT hx - last dose was this morning at ~ 0730 (per the patient).  Assessment: Pharmacy has been consulted to start Eliquis (home dose) for a patient with a history DVT. Baseline CBC and Scr are appropriate to resume home Eliquis.    Plan:  Will order Eliquis 5 mg BID to start this evening. Pharmacy will check CBC and Scr every 3 days, as per protocol.     At this time, pharmacy will sign-off on the consult.     Thank you for allowing pharmacy to be a part of this patient's care.  Rowland Lathe 05/12/2020,5:14 PM

## 2020-05-12 NOTE — ED Triage Notes (Signed)
CP and SOB  X 2 weeks, came to ED last week but left due to long wait. Progressively getting worse. Pt with hx of CHF. Pain radiates across chest.

## 2020-05-12 NOTE — ED Notes (Signed)
Pt ate all of dinner tray. 

## 2020-05-12 NOTE — H&P (Signed)
History and Physical    Gabriel [name]. ERD:[id number] DOB: 09-05-1970 DOA: 05/12/2020  PCP: Patient, No Pcp Per  Patient coming from: Home, independent of all ADLs.  I have personally briefly reviewed patient's old medical records in [place]  Chief Complaint: Chest pain and shortness of breath.  HPI: Gabriel [name]. is a 49 y.o. male with medical history significant of coronary artery disease, chronic systolic congestive heart failure, essential hypertension, type 2 diabetes, COPD, chronic kidney disease stage II, who present to the hospital with chest pain and shortness of breath. Patient has baseline short of breath with moderate exertion, infrequent paroxysmal nocturnal dyspnea.  But a symptom has been stable.  No weight gain.  He started have chest pain 10 days ago.  He remember woke up in the morning complaining of pain in the chest.  The pain localized in the mid and the left side of chest, 10/10 in severity.  He describes the pain as stabbing and persistent.  The pain happens intermittently with each episode last about 15 to 20 minutes.  The pain also radiated to the left shoulder.  The pain seemed to be worse when he take a deep breath or with movement.  He did not have any trauma recently.  At the same time, patient short of breath has been getting worse.  He has more frequent paroxysmal nocturnal dyspnea.  He also had orthopnea.  He did not have any nausea vomiting.  Did not have any skin rash on the chest. ED Course: Upon arrival,, he was afebrile.  Heart rate 91, blood pressure 185/113, no hypoxemia.  ABG did not show significant abnormality.  Troponin peak 74, BNP 1168.  Chest x-ray showed congestion with small pleural effusion.  EKG showed nonspecific ST-T changes, no ST elevation, frequent PVCs.  He was giving a dose of IV Lasix.  He was also giving a dose of nitroglycerin, which did not improve his chest pain, but it caused some headache.  Review of Systems: As per  HPI all system reviewed, otherwise negative. Exception: Patient has occasional dizziness but no syncope.  He also has been coughing for the last 10 days which are nonproductive.  He did not have any weight gain.   Past Medical History:  Diagnosis Date  . Chest wall pain, chronic   . Chronic systolic CHF (congestive heart failure) ([place])    a. 03/2015 Echo: EF 45-50%; b. 12/2015 Echo: EF 20-25%; c. 02/2016 Echo: EF 30-35%; d. 11/2016 Echo: EF 40-45%; e. 06/2019 Echo: EF 30-35%; f. 4/42021 Echo: EF 25-30%.  . Chronic Troponin Elevation   . CKD (chronic kidney disease), stage II-III   . COPD (chronic obstructive pulmonary disease) ([place])   . Diabetes mellitus without complication ([place])   . Hypertension    Resolved since weight loss  . NICM (nonischemic cardiomyopathy) ([place])    a. 03/2015 Echo: EF 45-50%; b. 12/2015 Echo: EF 20-25%; c. 02/2016 Echo: EF 30-35%; d. 11/2016 Echo: EF 40-45%; e. 06/2019 Echo: EF 30-35%; f. 11/2019 Echo: EF 25-30%, glob HK. Mild LVH. Gr2 DD. Mild-mod AI. BAE. Triv MR.  . Non-obstructive CAD (coronary artery disease)    a. 04/2015 low risk MV;  b. 12/2016 Cath: minor irregs in LAD/Diag/LCX/OM, RCA 40p/m/d.  [name] (nonsustained ventricular tachycardia) ([place])    a. 12/2015 noted on tele-->amio;  b. 12/2015 Event monitor: no VT. Atach noted.  . Obesity (BMI 30.0-34.9)   . Psoriasis   . Syncope    a.  01/2016 - felt to be vasovagal.  . Tobacco abuse     Past Surgical History:  Procedure Laterality Date  . AMPUTATION    . CARDIAC CATHETERIZATION    . FINGER AMPUTATION     Traumatic  . FINGER FRACTURE SURGERY Left   . LEFT HEART CATH AND CORONARY ANGIOGRAPHY N/A 01/06/2017   Procedure: Left Heart Cath and Coronary Angiography;  Surgeon: Wellington Hampshire, MD;  Location: [place] [facility];  Service: Cardiovascular;  Laterality: N/A;     reports that he has been smoking cigarettes. He has a 16.50 pack-year smoking history. He has quit using smokeless tobacco. He reports  current alcohol use. He reports that he does not use drugs.  Allergies  Allergen Reactions  . Metformin And Related Nausea And Vomiting  . Prednisone Other (See Comments)    Reaction: Hallucinations    . Zantac [Ranitidine Hcl] Diarrhea and Nausea Only    Night sweats    Family History  Problem Relation Age of Onset  . Diabetes Mellitus II Mother   . Hypothyroidism Mother   . Hypertension Mother   . Hypertension Father   . Gout Father   . Cancer Paternal Aunt   . Cancer Maternal Grandfather   . Diabetes Maternal Grandfather    Social history: Heavy smoker since age of 29, recently down to 2 packs/week. Does not drink alcohol. Denies recreational drug use.  Prior to Admission medications   Medication Sig Start Date End Date Taking? Authorizing Provider  apixaban (ELIQUIS) 5 MG TABS tablet Take 1 tablet (5 mg total) by mouth 2 (two) times daily. 01/05/20   Minna Merritts, MD  blood glucose meter kit and supplies KIT Dispense based on patient and insurance preference. Use up to four times daily as directed. (FOR ICD-9 250.00, 250.01). 03/02/20   Iloabachie, Chioma E, NP  carvedilol (COREG) 25 MG tablet Take 1 tablet (25 mg total) by mouth 2 (two) times daily with a meal. 03/08/20 04/07/20  Iloabachie, Chioma E, NP  cloNIDine (CATAPRES) 0.1 MG tablet Take 0.5 tablets (0.05 mg total) by mouth 2 (two) times daily. Takes 1/2 tablet twice daily 03/08/20 04/07/20  Iloabachie, Chioma E, NP  fenofibrate (TRICOR) 145 MG tablet Take 1 tablet (145 mg total) by mouth daily. 01/21/20   Larey Dresser, MD  Insulin Glargine [facility]) 100 UNIT/ML Inject 0.28 mLs (28 Units total) into the skin at bedtime. 03/16/20   Iloabachie, Chioma E, NP  insulin lispro (HUMALOG) 100 UNIT/ML KwikPen Inject 0.03 mLs (3 Units total) into the skin 3 (three) times daily before meals. 03/16/20   Iloabachie, Chioma E, NP  Insulin Pen Needle 29G X 5MM MISC 1 Dose by Does not apply route 4 (four) times daily -  with  meals and at bedtime. 01/02/20   Wieting, Richard, MD  Ipratropium-Albuterol (COMBIVENT) 20-100 MCG/ACT AERS respimat Inhale 1 puff into the lungs every 6 (six) hours. 01/02/20   Loletha Grayer, MD  nitroGLYCERIN (NITROSTAT) 0.4 MG SL tablet Place 1 tablet (0.4 mg total) under the tongue every 5 (five) minutes x 3 doses as needed for chest pain. 06/27/19   [name], MD  rosuvastatin (CRESTOR) 20 MG tablet Take 1 tablet (20 mg total) by mouth at bedtime. 01/21/20   Larey Dresser, MD  sacubitril-valsartan (ENTRESTO) 49-51 MG Take 1 tablet by mouth 2 (two) times daily. 03/07/20   Larey Dresser, MD  spironolactone (ALDACTONE) 25 MG tablet Take 1 tablet (25 mg total) by mouth  2 (two) times daily. 03/08/20 04/07/20  Iloabachie, Chioma E, NP  torsemide (DEMADEX) 20 MG tablet Take 2 tablets (40 mg total) by mouth 2 (two) times daily. 03/08/20   Caryl Asp E, NP    Physical Exam: Vitals:   05/12/20 1500 05/12/20 1530 05/12/20 1600 05/12/20 1635  BP: (!) 177/110 (!) 162/100  (!) 166/110  Pulse: 86 86 84 89  Resp: (!) 23 (!) 26 (!) 24 (!) 24  Temp:      TempSrc:      SpO2:  94% 95% 95%  Weight:      Height:        Constitutional: NAD, calm, comfortable Vitals:   05/12/20 1500 05/12/20 1530 05/12/20 1600 05/12/20 1635  BP: (!) 177/110 (!) 162/100  (!) 166/110  Pulse: 86 86 84 89  Resp: (!) 23 (!) 26 (!) 24 (!) 24  Temp:      TempSrc:      SpO2:  94% 95% 95%  Weight:      Height:       Eyes: PERRL, lids and conjunctivae normal ENMT: Mucous membranes are moist. Posterior pharynx clear of any exudate or lesions.Normal dentition.  Neck: normal, supple, no masses, no thyromegaly Respiratory: Decreased breathing sounds, no wheezing, no crackles. Normal respiratory effort. No accessory muscle use.  Cardiovascular: Irregular with frequent PVCs, no murmurs / rubs / gallops. No extremity edema. 2+ pedal pulses. No carotid bruits.  Abdomen: no tenderness, no masses palpated. No  hepatosplenomegaly. Bowel sounds positive.  Musculoskeletal: no clubbing / cyanosis. No joint deformity upper and lower extremities. Good ROM, no contractures. Normal muscle tone.  Skin: no rashes, lesions, ulcers. No induration Neurologic: CN 2-12 grossly intact. Sensation intact, DTR normal. Strength 5/5 in all 4.  Psychiatric: Normal judgment and insight. Alert and oriented x 3. Normal mood.  Left upper chest tenderness.   Labs on Admission: I have personally reviewed following labs and imaging studies  CBC: Recent Labs  Lab 05/12/20 1227  WBC 9.7  HGB 15.1  HCT 45.5  MCV 88.7  PLT 588   Basic Metabolic Panel: Recent Labs  Lab 05/12/20 1227  NA 140  K 4.4  CL 104  CO2 25  GLUCOSE 270*  BUN 17  CREATININE 1.11  CALCIUM 9.0   GFR: Estimated Creatinine Clearance: 102.3 mL/min (by C-G formula based on SCr of 1.11 mg/dL). Liver Function Tests: No results for input(s): AST, ALT, ALKPHOS, BILITOT, PROT, ALBUMIN in the last 168 hours. No results for input(s): LIPASE, AMYLASE in the last 168 hours. No results for input(s): AMMONIA in the last 168 hours. Coagulation Profile: No results for input(s): INR, PROTIME in the last 168 hours. Cardiac Enzymes: No results for input(s): CKTOTAL, CKMB, CKMBINDEX, TROPONINI in the last 168 hours. BNP (last 3 results) No results for input(s): PROBNP in the last 8760 hours. HbA1C: No results for input(s): HGBA1C in the last 72 hours. CBG: No results for input(s): GLUCAP in the last 168 hours. Lipid Profile: No results for input(s): CHOL, HDL, LDLCALC, TRIG, CHOLHDL, LDLDIRECT in the last 72 hours. Thyroid Function Tests: No results for input(s): TSH, T4TOTAL, FREET4, T3FREE, THYROIDAB in the last 72 hours. Anemia Panel: No results for input(s): VITAMINB12, FOLATE, FERRITIN, TIBC, IRON, RETICCTPCT in the last 72 hours. Urine analysis:    Component Value Date/Time   COLORURINE STRAW (A) 01/09/2018 1659   APPEARANCEUR Clear  03/08/2020 0951   LABSPEC 1.026 01/09/2018 1659   LABSPEC 1.019 11/12/2012 1713   PHURINE 6.0  01/09/2018 1659   GLUCOSEU 3+ (A) 03/08/2020 0951   GLUCOSEU Negative 11/12/2012 1713   HGBUR SMALL (A) 01/09/2018 1659   BILIRUBINUR Negative 03/08/2020 0951   BILIRUBINUR Negative 11/12/2012 1713   KETONESUR 5 (A) 01/09/2018 1659   PROTEINUR Negative 03/08/2020 0951   PROTEINUR 100 (A) 01/09/2018 1659   NITRITE Negative 03/08/2020 0951   NITRITE NEGATIVE 01/09/2018 1659   LEUKOCYTESUR Negative 03/08/2020 0951   LEUKOCYTESUR Negative 11/12/2012 1713    Radiological Exams on Admission: DG Chest 2 View  Result Date: 05/12/2020 CLINICAL DATA:  Chest pain short of breath EXAM: CHEST - 2 VIEW COMPARISON:  05/04/2020 FINDINGS: Cardiac enlargement. Mild vascular congestion has progressed in the interval. Progression of mild bibasilar airspace disease. Minimal pleural fluid bilaterally unchanged. IMPRESSION: Cardiac enlargement with vascular congestion which shows mild progression. Small bilateral effusions. Mild bibasilar atelectasis has progressed. Electronically Signed   By: Franchot Gallo M.D.   On: 05/12/2020 12:46    EKG: Independently reviewed. As above  Assessment/Plan Active Problems:   Acute on chronic systolic (congestive) heart failure ([place])  #1.  Acute on chronic systolic congestive heart failure. Reviewed echocardiogram performed on 11/23/2019, ejection fraction 25 to 30%. Patient has worsening short of breath with exertion, paroxysmal nocturnal dyspnea, orthopnea, he does not seem to have any weight gain.  Chest x-ray consistent with vascular congestion.  Elevated BNP.  Patient clearly has exacerbation of congestive heart failure.  Patient currently does not require any oxygen.  Will start IV Lasix.  Continue home dose Coreg, add ACE inhibitor.  #2.  Chest pain. Patient was chronically taking anticoagulation, not a concern for PE.  However, due to severity of chest pain, I will  obtain a CT chest to rule out aortic dissection.  Also obtain echocardiogram to rule out pericarditis. Patient did have significant cough recently, chest CT scan will also help to identify any rib fracture.  Patient has significant tenderness in the left upper chest, no shingles identified.  This could be secondary to skeletal muscular pain.  For now, patient will be given as needed pain medicine.  We will also placed on Lidoderm patch. Patient has a mild elevation of troponin, not consistent with acute coronary syndrome.  Patient had a chest pain so severe and lasted for 10 days, troponin should be much higher.  3.  Mild elevation troponin. Secondary to exacerbation congestive heart failure.  Follow.  4.  Type 2 diabetes uncontrolled with hyperglycemia. Start Lantus and sliding scale insulin.  5.  Essential hypertension. Continues on home medicines per  6.  COPD. No exacerbation, no bronchospasm.  Follow.    DVT prophylaxis: Eliquis Code Status: Full Family Communication: None Disposition Plan: Home in 2-3 days Consults called: None Admission status: Admit to progressive unit as inpatient.  Patient has significant just heart failure, severe chest pain, justify for inpatient admission.  Patient probably will be in the hospital for 2 or 3 days.   Sharen Hones MD [facility]   If 7PM-7AM, please contact night-coverage [email or web address] Use universal [place] password for that web site. If you do not have the password, please call the hospital operator.  05/12/2020, 4:52 PM

## 2020-05-12 NOTE — ED Notes (Signed)
Provider B Randol Kern notified that pt's pain unchanged after nitro paste, lidocaine patch, and dilaudid IV.

## 2020-05-12 NOTE — ED Notes (Signed)
Pt continues to c/o 10/10 left-sided CP. Resp reg/unlabored. Skin dry and color appropriate. Pt laying calmly in bed. States episodes like this have happened several times since his heart attack years ago.

## 2020-05-12 NOTE — ED Notes (Signed)
CT at bedside stated that pt needed to go or pt would've had to wait another hour for scan. This RN will give other meds when pt returns.

## 2020-05-12 NOTE — ED Provider Notes (Signed)
[facility] Emergency Department Provider Note   ____________________________________________   First MD Initiated Contact with Patient 05/12/20 1330     (approximate)  I have reviewed the triage vital signs and the nursing notes.   HISTORY  Chief Complaint Chest Pain and Shortness of Breath    HPI Gabriel N Jmichael Gille. is a 49 y.o. male who comes in complaining of chest pain and shortness of breath getting worse for 2 weeks.  He came last week but had to wait too long so he went home.  Patient reports the pain is both sharp and stabbing and heavy tight pressure pain.  It is all worse with exertion and better when he rests.  Pain is moderate to moderately severe.  Patient has a history of CHF and pulmonary emboli and takes Eliquis.        Past Medical History:  Diagnosis Date  . Chest wall pain, chronic   . Chronic systolic CHF (congestive heart failure) ([place])    a. 03/2015 Echo: EF 45-50%; b. 12/2015 Echo: EF 20-25%; c. 02/2016 Echo: EF 30-35%; d. 11/2016 Echo: EF 40-45%; e. 06/2019 Echo: EF 30-35%; f. 4/42021 Echo: EF 25-30%.  . Chronic Troponin Elevation   . CKD (chronic kidney disease), stage II-III   . COPD (chronic obstructive pulmonary disease) ([place])   . Diabetes mellitus without complication ([place])   . Hypertension    Resolved since weight loss  . NICM (nonischemic cardiomyopathy) ([place])    a. 03/2015 Echo: EF 45-50%; b. 12/2015 Echo: EF 20-25%; c. 02/2016 Echo: EF 30-35%; d. 11/2016 Echo: EF 40-45%; e. 06/2019 Echo: EF 30-35%; f. 11/2019 Echo: EF 25-30%, glob HK. Mild LVH. Gr2 DD. Mild-mod AI. BAE. Triv MR.  . Non-obstructive CAD (coronary artery disease)    a. 04/2015 low risk MV;  b. 12/2016 Cath: minor irregs in LAD/Diag/LCX/OM, RCA 40p/m/d.  [name] (nonsustained ventricular tachycardia) ([place])    a. 12/2015 noted on tele-->amio;  b. 12/2015 Event monitor: no VT. Atach noted.  . Obesity (BMI 30.0-34.9)   . Psoriasis   . Syncope    a. 01/2016 - felt to be  vasovagal.  . Tobacco abuse     Patient Active Problem List   Diagnosis Date Noted  . Erectile dysfunction due to arterial insufficiency 03/15/2020  . Screening PSA (prostate specific antigen) 03/15/2020  . Erectile dysfunction 03/02/2020  . Pulmonary embolism, unspecified chronicity, unspecified pulmonary embolism type, unspecified whether acute cor pulmonale present ([place]) 01/03/2020  . Multiple subsegmental pulmonary emboli without acute cor pulmonale ([place])   . 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 Hemoptysis   . Costochondritis   . Acute respiratory failure with hypoxia ([place])   . Acute on chronic combined systolic (congestive) and diastolic (congestive) heart failure ([place]) 12/20/2019  . CHF exacerbation ([place]) 11/22/2019  . Acute CHF (congestive heart failure) ([place]) 06/26/2019  . CKD stage 3 due to type 2 diabetes mellitus ([place]) 06/26/2019  . Type 2 diabetes mellitus with hyperlipidemia ([place]) 06/26/2019  . Hypertensive urgency 06/26/2019  . Acute on chronic combined systolic and diastolic CHF (congestive heart failure) ([place]) 09/02/2017  . Chest wall pain, chronic 08/24/2017  . Dizziness   . Noncompliance with medications 04/29/2017  . Back pain 03/06/2017  . Tobacco use 12/04/2016  . Gallbladder sludge 12/04/2016  . Acute on chronic systolic CHF (congestive heart failure) ([place]) 11/28/2016  . Coronary artery disease, non-occlusive 03/15/2016  . Atypical chest pain 02/16/2016  . Orthostatic hypotension 01/23/2016  . Hypomagnesemia 01/23/2016  .  Chronic systolic CHF (congestive heart failure) ([place]) 01/23/2016  . NICM (nonischemic cardiomyopathy) ([place]) 01/17/2016  . NSVT (nonsustained ventricular tachycardia) ([place])   . Elevated troponin   . Hyperlipidemia 05/17/2015  . COPD (chronic obstructive pulmonary disease) ([place]) 05/17/2015  . Essential hypertension 03/31/2015    Past Surgical History:  Procedure Laterality Date  . AMPUTATION    . CARDIAC CATHETERIZATION    . FINGER  AMPUTATION     Traumatic  . FINGER FRACTURE SURGERY Left   . LEFT HEART CATH AND CORONARY ANGIOGRAPHY N/A 01/06/2017   Procedure: Left Heart Cath and Coronary Angiography;  Surgeon: Wellington Hampshire, MD;  Location: [facility];  Service: Cardiovascular;  Laterality: N/A;    Prior to Admission medications   Medication Sig Start Date End Date Taking? Authorizing Provider  apixaban (ELIQUIS) 5 MG TABS tablet Take 1 tablet (5 mg total) by mouth 2 (two) times daily. 01/05/20   Minna Merritts, MD  blood glucose meter kit and supplies KIT Dispense based on patient and insurance preference. Use up to four times daily as directed. (FOR ICD-9 250.00, 250.01). 03/02/20   Iloabachie, Chioma E, NP  carvedilol (COREG) 25 MG tablet Take 1 tablet (25 mg total) by mouth 2 (two) times daily with a meal. 03/08/20 04/07/20  Iloabachie, Chioma E, NP  cloNIDine (CATAPRES) 0.1 MG tablet Take 0.5 tablets (0.05 mg total) by mouth 2 (two) times daily. Takes 1/2 tablet twice daily 03/08/20 04/07/20  Iloabachie, Chioma E, NP  fenofibrate (TRICOR) 145 MG tablet Take 1 tablet (145 mg total) by mouth daily. 01/21/20   Larey Dresser, MD  Insulin Glargine [facility]) 100 UNIT/ML Inject 0.28 mLs (28 Units total) into the skin at bedtime. 03/16/20   Iloabachie, Chioma E, NP  insulin lispro (HUMALOG) 100 UNIT/ML KwikPen Inject 0.03 mLs (3 Units total) into the skin 3 (three) times daily before meals. 03/16/20   Iloabachie, Chioma E, NP  Insulin Pen Needle 29G X 5MM MISC 1 Dose by Does not apply route 4 (four) times daily -  with meals and at bedtime. 01/02/20   Wieting, Richard, MD  Ipratropium-Albuterol (COMBIVENT) 20-100 MCG/ACT AERS respimat Inhale 1 puff into the lungs every 6 (six) hours. 01/02/20   Loletha Grayer, MD  nitroGLYCERIN (NITROSTAT) 0.4 MG SL tablet Place 1 tablet (0.4 mg total) under the tongue every 5 (five) minutes x 3 doses as needed for chest pain. 06/27/19   [name], MD  rosuvastatin (CRESTOR) 20  MG tablet Take 1 tablet (20 mg total) by mouth at bedtime. 01/21/20   Larey Dresser, MD  sacubitril-valsartan (ENTRESTO) 49-51 MG Take 1 tablet by mouth 2 (two) times daily. 03/07/20   Larey Dresser, MD  spironolactone (ALDACTONE) 25 MG tablet Take 1 tablet (25 mg total) by mouth 2 (two) times daily. 03/08/20 04/07/20  Iloabachie, Chioma E, NP  torsemide (DEMADEX) 20 MG tablet Take 2 tablets (40 mg total) by mouth 2 (two) times daily. 03/08/20   Iloabachie, Chioma E, NP    Allergies Metformin and related, Prednisone, and Zantac [ranitidine hcl]  Family History  Problem Relation Age of Onset  . Diabetes Mellitus II Mother   . Hypothyroidism Mother   . Hypertension Mother   . Hypertension Father   . Gout Father   . Cancer Paternal Aunt   . Cancer Maternal Grandfather   . Diabetes Maternal Grandfather     Social History Social History   Tobacco Use  . Smoking status: Current Some  Day Smoker    Packs/day: 0.50    Years: 33.00    Pack years: 16.50    Types: Cigarettes    Last attempt to quit: 08/18/2019    Years since quitting: 0.7  . Smokeless tobacco: Former Network engineer  . Vaping Use: Former  Substance Use Topics  . Alcohol use: Yes    Alcohol/week: 0.0 standard drinks    Comment: occassionally  . Drug use: No    Review of Systems  Constitutional: No fever/chills Eyes: No visual changes. ENT: No sore throat. Cardiovascular:  chest pain. Respiratory: shortness of breath. Gastrointestinal: No abdominal pain.  No nausea, no vomiting.  No diarrhea.  No constipation. Genitourinary: Negative for dysuria. Musculoskeletal: Negative for back pain. Skin: Negative for rash. Neurological: Negative for headaches, focal weakness   ____________________________________________   PHYSICAL EXAM:  VITAL SIGNS: ED Triage Vitals [05/12/20 1224]  Enc Vitals Group     BP (!) 185/113     Pulse Rate 97     Resp (!) 24     Temp 97.6 F (36.4 C)     Temp Source Oral      SpO2 98 %     Weight 250 lb (113.4 kg)     Height 6' (1.829 m)     Head Circumference      Peak Flow      Pain Score 10     Pain Loc      Pain Edu?      Excl. in [place]?     Constitutional: Alert and oriented.  Breathing hard and fast looks short of breath Eyes: Conjunctivae are normal.  Head: Atraumatic. Nose: No congestion/rhinnorhea. Mouth/Throat: Mucous membranes are moist.  Oropharynx non-erythematous. Neck: No stridor.   Cardiovascular: Normal rate, regular rhythm. Grossly normal heart sounds.  Good peripheral circulation. Respiratory: Increased respiratory effort.  No retractions. Lungs CTAB. Gastrointestinal: Soft and nontender. No distention. No abdominal bruits.  Musculoskeletal: No lower extremity tenderness nor edema.   Neurologic:  Normal speech and language. No gross focal neurologic deficits are appreciated.  Skin:  Skin is warm, dry and intact. No rash noted.   ____________________________________________   LABS (all labs ordered are listed, but only abnormal results are displayed)  Labs Reviewed  BASIC METABOLIC PANEL - Abnormal; Notable for the following components:      Result Value   Glucose, Bld 270 (*)    All other components within normal limits  BRAIN NATRIURETIC PEPTIDE - Abnormal; Notable for the following components:   B Natriuretic Peptide 1,168.1 (*)    All other components within normal limits  TROPONIN I (HIGH SENSITIVITY) - Abnormal; Notable for the following components:   Troponin I (High Sensitivity) 64 (*)    All other components within normal limits  RESPIRATORY PANEL BY RT PCR (FLU A&B, COVID)  CBC  FIBRIN DERIVATIVES D-DIMER ([facility] ONLY)  BLOOD GAS, VENOUS   ____________________________________________  EKG  EKG read interpreted by me shows normal sinus rhythm rate of 95 rightward axis patient has flipped T's in 2 3 and F which she has had previously in May but did not have them in June there is decreased R wave progression as  well. ____________________________________________  RADIOLOGY  ED MD interpretation: Chest x-ray shows enlarged heart with pulmonary vascular congestion radiology read it I reviewed the film.  Official radiology report(s): DG Chest 2 View  Result Date: 05/12/2020 CLINICAL DATA:  Chest pain short of breath EXAM: CHEST - 2 VIEW COMPARISON:  05/04/2020  FINDINGS: Cardiac enlargement. Mild vascular congestion has progressed in the interval. Progression of mild bibasilar airspace disease. Minimal pleural fluid bilaterally unchanged. IMPRESSION: Cardiac enlargement with vascular congestion which shows mild progression. Small bilateral effusions. Mild bibasilar atelectasis has progressed. Electronically Signed   By: Franchot Gallo M.D.   On: 05/12/2020 12:46    ____________________________________________   PROCEDURES  Procedure(s) performed (including Critical Care):  Procedures   ____________________________________________   INITIAL IMPRESSION / ASSESSMENT AND PLAN / ED COURSE  Patient with elevated BNP acute chest x-ray consistent with CHF but he also has sharp stabbing chest pain and a history of PEs.  He could be having PEs even on Eliquis although this would be unlikely.  I have sent a D-dimer.  If this is elevated we will attempt to CT him.  We just have to be very careful because the extra fluid from the dye could possibly make his CHF considerably worse.              ____________________________________________   FINAL CLINICAL IMPRESSION(S) / ED DIAGNOSES  Final diagnoses:  Dyspnea, unspecified type  Congestive heart failure, unspecified HF chronicity, unspecified heart failure type [facility])     ED Discharge Orders    None      *Please note:  Gabriel Sorlie. was evaluated in Emergency Department on 05/12/2020 for the symptoms described in the history of present illness. He was evaluated in the context of the global COVID-19 pandemic, which necessitated  consideration that the patient might be at risk for infection with the SARS-CoV-2 virus that causes COVID-19. Institutional protocols and algorithms that pertain to the evaluation of patients at risk for COVID-19 are in a state of rapid change based on information released by regulatory bodies including the CDC and federal and state organizations. These policies and algorithms were followed during the patient's care in the ED.  Some ED evaluations and interventions may be delayed as a result of limited staffing during and the pandemic.*   Note:  This document was prepared using Dragon voice recognition software and may include unintentional dictation errors.    Nena Polio, MD 05/12/20 1355

## 2020-05-12 NOTE — ED Notes (Signed)
Provider B Randol Kern notified via secure chat that pt requesting morphine instead of dilaudid as he states it ha worked for this type of CP in the past.

## 2020-05-12 NOTE — ED Notes (Signed)
Pt resting in bed. Bed locked low. Rail up. Call bell within reach.  

## 2020-05-12 NOTE — ED Notes (Signed)
Report given to Georgie RN 

## 2020-05-13 ENCOUNTER — Inpatient Hospital Stay: Admit: 2020-05-13 | Payer: Medicaid Other

## 2020-05-13 ENCOUNTER — Other Ambulatory Visit: Payer: Self-pay

## 2020-05-13 DIAGNOSIS — Z794 Long term (current) use of insulin: Secondary | ICD-10-CM

## 2020-05-13 DIAGNOSIS — E1165 Type 2 diabetes mellitus with hyperglycemia: Secondary | ICD-10-CM

## 2020-05-13 LAB — MAGNESIUM: Magnesium: 1.8 mg/dL (ref 1.7–2.4)

## 2020-05-13 LAB — CBC WITH DIFFERENTIAL/PLATELET
Abs Immature Granulocytes: 0.07 10*3/uL (ref 0.00–0.07)
Basophils Absolute: 0.1 10*3/uL (ref 0.0–0.1)
Basophils Relative: 1 %
Eosinophils Absolute: 0.2 10*3/uL (ref 0.0–0.5)
Eosinophils Relative: 2 %
HCT: 46.1 % (ref 39.0–52.0)
Hemoglobin: 15.7 g/dL (ref 13.0–17.0)
Immature Granulocytes: 1 %
Lymphocytes Relative: 23 %
Lymphs Abs: 2.2 10*3/uL (ref 0.7–4.0)
MCH: 29.7 pg (ref 26.0–34.0)
MCHC: 34.1 g/dL (ref 30.0–36.0)
MCV: 87.3 fL (ref 80.0–100.0)
Monocytes Absolute: 0.6 10*3/uL (ref 0.1–1.0)
Monocytes Relative: 7 %
Neutro Abs: 6.2 10*3/uL (ref 1.7–7.7)
Neutrophils Relative %: 66 %
Platelets: 185 10*3/uL (ref 150–400)
RBC: 5.28 MIL/uL (ref 4.22–5.81)
RDW: 13.1 % (ref 11.5–15.5)
WBC: 9.5 10*3/uL (ref 4.0–10.5)
nRBC: 0 % (ref 0.0–0.2)

## 2020-05-13 LAB — GLUCOSE, CAPILLARY
Glucose-Capillary: 193 mg/dL — ABNORMAL HIGH (ref 70–99)
Glucose-Capillary: 222 mg/dL — ABNORMAL HIGH (ref 70–99)
Glucose-Capillary: 246 mg/dL — ABNORMAL HIGH (ref 70–99)
Glucose-Capillary: 287 mg/dL — ABNORMAL HIGH (ref 70–99)
Glucose-Capillary: 325 mg/dL — ABNORMAL HIGH (ref 70–99)

## 2020-05-13 LAB — BASIC METABOLIC PANEL
Anion gap: 12 (ref 5–15)
BUN: 17 mg/dL (ref 6–20)
CO2: 26 mmol/L (ref 22–32)
Calcium: 9 mg/dL (ref 8.9–10.3)
Chloride: 101 mmol/L (ref 98–111)
Creatinine, Ser: 1.16 mg/dL (ref 0.61–1.24)
GFR calc Af Amer: >60 mL/min (ref >60)
GFR calc non Af Amer: >60 mL/min (ref >60)
Glucose, Bld: 267 mg/dL — ABNORMAL HIGH (ref 70–99)
Potassium: 3.4 mmol/L — ABNORMAL LOW (ref 3.5–5.1)
Sodium: 139 mmol/L (ref 135–145)

## 2020-05-13 LAB — HEMOGLOBIN A1C
Hgb A1c MFr Bld: 8.2 % — ABNORMAL HIGH (ref 4.8–5.6)
Mean Plasma Glucose: 188.64 mg/dL

## 2020-05-13 LAB — TROPONIN I (HIGH SENSITIVITY)
Troponin I (High Sensitivity): 84 ng/L — ABNORMAL HIGH (ref <18)
Troponin I (High Sensitivity): 73 ng/L — ABNORMAL HIGH (ref <18)

## 2020-05-13 MED ORDER — POTASSIUM CHLORIDE CRYS ER 20 MEQ PO TBCR
40.0000 meq | EXTENDED_RELEASE_TABLET | Freq: Once | ORAL | Status: AC
  Administered 2020-05-13: 40 meq via ORAL
  Filled 2020-05-13: qty 2

## 2020-05-13 MED ORDER — OXYCODONE-ACETAMINOPHEN 5-325 MG PO TABS
1.0000 | ORAL_TABLET | ORAL | Status: DC | PRN
  Administered 2020-05-13: 1 via ORAL
  Filled 2020-05-13: qty 1

## 2020-05-13 MED ORDER — POTASSIUM CHLORIDE 20 MEQ PO PACK: 40.0000 meq | PACK | Freq: Once | ORAL | Status: DC

## 2020-05-13 MED ORDER — INSULIN GLARGINE 100 UNIT/ML ~~LOC~~ SOLN
26.0000 [IU] | Freq: Every day | SUBCUTANEOUS | Status: DC
  Administered 2020-05-13: 26 [IU] via SUBCUTANEOUS
  Filled 2020-05-13 (×3): qty 0.26

## 2020-05-13 MED ORDER — CLONIDINE HCL 0.1 MG PO TABS
0.1000 mg | ORAL_TABLET | Freq: Two times a day (BID) | ORAL | Status: DC
  Administered 2020-05-13 – 2020-05-14 (×2): 0.1 mg via ORAL
  Filled 2020-05-13 (×2): qty 1

## 2020-05-13 NOTE — ED Notes (Signed)
Rachael Fee, NP notified that patient had 3 beat run of Vtach.

## 2020-05-13 NOTE — Progress Notes (Signed)
PROGRESS NOTE    [name]  QQI:[id number] DOB: 01/06/1971 DOA: 05/12/2020 PCP: Patient, No Pcp Per   Chief complaint.  Shortness of breath and chest pain. Brief Narrative:  Shanna Strength. is a 49 y.o. male with medical history significant of coronary artery disease, chronic systolic congestive heart failure, essential hypertension, type 2 diabetes, COPD, chronic kidney disease stage II, who present to the hospital with chest pain and shortness of breath. Physical examination showed chest wall tenderness in the left upper chest.  X-ray showed vascular congestion.  Elevated BNP at 1168.  CT angiogram ruled out aortic dissection.  Patient was started on IV Lasix and pain medicine as needed.  9/25.  Chest pain is slightly better.  Continue IV Lasix.  Assessment & Plan:   Active Problems:   Essential hypertension   Elevated troponin   Other chest pain   Acute on chronic systolic (congestive) heart failure ([place])  #1. Acute on chronic systolic congestive heart failure. Patient still has some volume overload, continue IV Lasix.  Continue Entresto and beta-blocker.  2.  Chest pain. Patient was taking anticoagulation, CT scan showed decreased clot from previous PE.  No aortic dissection.  Chest pain most likely secondary to skeletal muscular pain.  Continue symptomatic treatment.  3.  Mild elevation troponin.  Secondary to exacerbation congestive heart failure. Follow.  4.  Uncontrolled type 2 diabetes with hyperglycemia. Increase Lantus, continue sliding scale insulin.  5.  Essential hypertension.  Continue home medicines.  6.  COPD. Still stable.  7.  Hypokalemia. Supplement.    DVT prophylaxis: Eliquis Code Status: Full Family Communication: None     Status is: Inpatient  Remains inpatient appropriate because:Inpatient level of care appropriate due to severity of illness, still requiring IV Lasix.   Dispo: The patient is from: Home               Anticipated d/c is to: Home              Anticipated d/c date is: 1 day              Patient currently is not medically stable to d/c.        I/O last 3 completed shifts: In: 480 [P.O.:480] Out: 3150 [Urine:3150] No intake/output data recorded.     Consultants:   None  Procedures: None  Antimicrobials: None  Subjective: Patient still complaining of left upper chest pain.  But seem to be improving.  Still short of breath with minimal exertion.  No cough. No fever chills. No abdominal or nausea vomiting. No dysuria hematuria.  Objective: Vitals:   05/13/20 1000 05/13/20 1026 05/13/20 1027 05/13/20 1032  BP: 126/78   115/78  Pulse: 71 63 66   Resp: 19 13 16    Temp:      TempSrc:      SpO2: 90% 93% 94%   Weight:      Height:        Intake/Output Summary (Last 24 hours) at 05/13/2020 1325 Last data filed at 05/12/2020 2046 Gross per 24 hour  Intake 480 ml  Output 3150 ml  Net -2670 ml   Filed Weights   05/12/20 1224  Weight: 113.4 kg    Examination:  General exam: Appears calm and comfortable  Respiratory system: Clear to auscultation. Respiratory effort normal. Cardiovascular system: S1 & S2 heard, RRR. No JVD, murmurs, rubs, gallops or clicks. No pedal edema. Gastrointestinal system: Abdomen is nondistended, soft and nontender. No organomegaly  or masses felt. Normal bowel sounds heard. Central nervous system: Alert and oriented. No focal neurological deficits. Extremities: Symmetric  Skin: No rashes, lesions or ulcers Psychiatry:  Mood & affect appropriate.  Left upper chest wall tenderness.    Data Reviewed: I have personally reviewed following labs and imaging studies  CBC: Recent Labs  Lab 05/12/20 1227 05/13/20 0529  WBC 9.7 9.5  NEUTROABS  --  6.2  HGB 15.1 15.7  HCT 45.5 46.1  MCV 88.7 87.3  PLT 189 163   Basic Metabolic Panel: Recent Labs  Lab 05/12/20 1227 05/13/20 0529  NA 140 139  K 4.4 3.4*  CL 104 101  CO2 25 26    GLUCOSE 270* 267*  BUN 17 17  CREATININE 1.11 1.16  CALCIUM 9.0 9.0  MG  --  1.8   GFR: Estimated Creatinine Clearance: 97.9 mL/min (by C-G formula based on SCr of 1.16 mg/dL). Liver Function Tests: No results for input(s): AST, ALT, ALKPHOS, BILITOT, PROT, ALBUMIN in the last 168 hours. No results for input(s): LIPASE, AMYLASE in the last 168 hours. No results for input(s): AMMONIA in the last 168 hours. Coagulation Profile: No results for input(s): INR, PROTIME in the last 168 hours. Cardiac Enzymes: No results for input(s): CKTOTAL, CKMB, CKMBINDEX, TROPONINI in the last 168 hours. BNP (last 3 results) No results for input(s): PROBNP in the last 8760 hours. HbA1C: Recent Labs    05/13/20 0027  HGBA1C 8.2*   CBG: Recent Labs  Lab 05/12/20 1739 05/13/20 0030 05/13/20 0742 05/13/20 1211  GLUCAP 170* 222* 246* 287*   Lipid Profile: No results for input(s): CHOL, HDL, LDLCALC, TRIG, CHOLHDL, LDLDIRECT in the last 72 hours. Thyroid Function Tests: No results for input(s): TSH, T4TOTAL, FREET4, T3FREE, THYROIDAB in the last 72 hours. Anemia Panel: No results for input(s): VITAMINB12, FOLATE, FERRITIN, TIBC, IRON, RETICCTPCT in the last 72 hours. Sepsis Labs: No results for input(s): PROCALCITON, LATICACIDVEN in the last 168 hours.  Recent Results (from the past 240 hour(s))  SARS Coronavirus 2 by RT PCR (hospital order, performed in [facility] hospital lab) Nasopharyngeal Nasopharyngeal Swab     Status: None   Collection Time: 05/04/20 10:11 PM   Specimen: Nasopharyngeal Swab  Result Value Ref Range Status   SARS Coronavirus 2 NEGATIVE NEGATIVE Final    Comment: (NOTE) SARS-CoV-2 target nucleic acids are NOT DETECTED.  The SARS-CoV-2 RNA is generally detectable in upper and lower respiratory specimens during the acute phase of infection. The lowest concentration of SARS-CoV-2 viral copies this assay can detect is 250 copies / mL. A negative result does not  preclude SARS-CoV-2 infection and should not be used as the sole basis for treatment or other patient management decisions.  A negative result may occur with improper specimen collection / handling, submission of specimen other than nasopharyngeal swab, presence of viral mutation(s) within the areas targeted by this assay, and inadequate number of viral copies (<250 copies / mL). A negative result must be combined with clinical observations, patient history, and epidemiological information.  Fact Sheet for Patients:   StrictlyIdeas.no  Fact Sheet for Healthcare Providers: BankingDealers.co.za  This test is not yet approved or  cleared by the [place] FDA and has been authorized for detection and/or diagnosis of SARS-CoV-2 by FDA under an Emergency Use Authorization (EUA).  This EUA will remain in effect (meaning this test can be used) for the duration of the COVID-19 declaration under Section 564(b)(1) of the Act, 21 U.S.C. section 360bbb-3(b)(1), unless  the authorization is terminated or revoked sooner.  Performed at [facility], [address] [place], [place] [address]   Respiratory Panel by RT PCR (Flu A&B, Covid) - Nasopharyngeal Swab     Status: None   Collection Time: 05/12/20  2:18 PM   Specimen: Nasopharyngeal Swab  Result Value Ref Range Status   SARS Coronavirus 2 by RT PCR NEGATIVE NEGATIVE Final    Comment: (NOTE) SARS-CoV-2 target nucleic acids are NOT DETECTED.  The SARS-CoV-2 RNA is generally detectable in upper respiratoy specimens during the acute phase of infection. The lowest concentration of SARS-CoV-2 viral copies this assay can detect is 131 copies/mL. A negative result does not preclude SARS-Cov-2 infection and should not be used as the sole basis for treatment or other patient management decisions. A negative result may occur with  improper specimen collection/handling, submission of  specimen other than nasopharyngeal swab, presence of viral mutation(s) within the areas targeted by this assay, and inadequate number of viral copies (<131 copies/mL). A negative result must be combined with clinical observations, patient history, and epidemiological information. The expected result is Negative.  Fact Sheet for Patients:  PinkCheek.be  Fact Sheet for Healthcare Providers:  GravelBags.it  This test is no t yet approved or cleared by the [place] FDA and  has been authorized for detection and/or diagnosis of SARS-CoV-2 by FDA under an Emergency Use Authorization (EUA). This EUA will remain  in effect (meaning this test can be used) for the duration of the COVID-19 declaration under Section 564(b)(1) of the Act, 21 U.S.C. section 360bbb-3(b)(1), unless the authorization is terminated or revoked sooner.     Influenza A by PCR NEGATIVE NEGATIVE Final   Influenza B by PCR NEGATIVE NEGATIVE Final    Comment: (NOTE) The Xpert Xpress SARS-CoV-2/FLU/RSV assay is intended as an aid in  the diagnosis of influenza from Nasopharyngeal swab specimens and  should not be used as a sole basis for treatment. Nasal washings and  aspirates are unacceptable for Xpert Xpress SARS-CoV-2/FLU/RSV  testing.  Fact Sheet for Patients: PinkCheek.be  Fact Sheet for Healthcare Providers: GravelBags.it  This test is not yet approved or cleared by the [place] FDA and  has been authorized for detection and/or diagnosis of SARS-CoV-2 by  FDA under an Emergency Use Authorization (EUA). This EUA will remain  in effect (meaning this test can be used) for the duration of the  Covid-19 declaration under Section 564(b)(1) of the Act, 21  U.S.C. section 360bbb-3(b)(1), unless the authorization is  terminated or revoked. Performed at [facility], [address], [place], [place] [address]          Radiology Studies: DG Chest 2 View  Result Date: 05/12/2020 CLINICAL DATA:  Chest pain short of breath EXAM: CHEST - 2 VIEW COMPARISON:  05/04/2020 FINDINGS: Cardiac enlargement. Mild vascular congestion has progressed in the interval. Progression of mild bibasilar airspace disease. Minimal pleural fluid bilaterally unchanged. IMPRESSION: Cardiac enlargement with vascular congestion which shows mild progression. Small bilateral effusions. Mild bibasilar atelectasis has progressed. Electronically Signed   By: Franchot Gallo M.D.   On: 05/12/2020 12:46   CT ANGIO CHEST AORTA W/CM &/OR WO/CM  Result Date: 05/12/2020 CLINICAL DATA:  Chest pain and shortness of breath for 2 weeks EXAM: CT ANGIOGRAPHY CHEST WITH CONTRAST TECHNIQUE: Multidetector CT imaging of the chest was performed using the standard protocol during bolus administration of intravenous contrast. Multiplanar CT image reconstructions and MIPs were obtained to evaluate the vascular  anatomy. CONTRAST:  178mL OMNIPAQUE IOHEXOL 350 MG/ML SOLN COMPARISON:  12/31/2019 FINDINGS: Cardiovascular: This is a technically adequate evaluation of the thoracic aorta. No evidence of aneurysm or dissection. No significant atherosclerosis. The heart is mildly enlarged but stable. No pericardial effusion. Minimal atherosclerosis within the coronary vasculature, most pronounced in the LAD and circumflex distributions. Mediastinum/Nodes: Stable borderline enlarged mediastinal and hilar adenopathy, measuring up to 2 cm in the subcarinal region. Thyroid, trachea, and esophagus are unremarkable. Lungs/Pleura: Residual consolidation in the right lower lobe likely reflects resolving pulmonary infarct after previous right lower lobe pulmonary embolus. There are small bilateral pleural effusions, less than 200 cc each. No acute airspace disease. Minimal left upper lobe atelectasis. No pneumothorax. The central airways are patent. Upper  Abdomen: No acute abnormality. Musculoskeletal: Stable bone island right third rib. No acute or destructive bony lesions. Reconstructed images demonstrate no additional findings. Review of the MIP images confirms the above findings. IMPRESSION: 1. No evidence of thoracic aortic aneurysm or dissection. 2. Stable mediastinal and hilar lymphadenopathy, nonspecific. 3. Improving right lower lobe consolidation, likely representing resolving pulmonary infarct after previous right lower lobe pulmonary embolus. 4. Trace bilateral pleural effusions. 5. Prominent coronary artery atherosclerosis, stable. Electronically Signed   By: Randa Ngo M.D.   On: 05/12/2020 18:28        Scheduled Meds:  apixaban  5 mg Oral BID   aspirin EC  81 mg Oral Daily   carvedilol  25 mg Oral BID WC   cloNIDine  0.1 mg Oral BID   furosemide  40 mg Intravenous Q12H   insulin aspart  0-9 Units Subcutaneous TID WC   insulin glargine  18 Units Subcutaneous QHS   Ipratropium-Albuterol  1 puff Inhalation Q6H   lidocaine  1 patch Transdermal Q24H   nitroGLYCERIN  0.5 inch Topical Once   rosuvastatin  20 mg Oral QHS   sacubitril-valsartan  1 tablet Oral BID   sodium chloride flush  3 mL Intravenous Q12H   spironolactone  25 mg Oral BID   Continuous Infusions:  sodium chloride Stopped (05/12/20 1506)   sodium chloride       LOS: 1 day    Time spent: 28 minutes    Sharen Hones, MD [facility]   To contact the attending provider between 7A-7P or the covering provider during after hours 7P-7A, please log into the web site [email or web address] and access using universal [place] password for that web site. If you do not have the password, please call the hospital operator.  05/13/2020, 1:25 PM

## 2020-05-13 NOTE — ED Notes (Signed)
This RN gave report to [employer]

## 2020-05-13 NOTE — ED Notes (Signed)
Pt given snack and water as requested.

## 2020-05-13 NOTE — ED Notes (Addendum)
Pt c/o sharp 9/10 CP, sohb and dizziness at this time. A repeat EKG was preformed and pt was placed on 2LNC. MD [facility] informed of such

## 2020-05-13 NOTE — ED Notes (Signed)
Called lab about missing Trop result. State it is still running. Next trop to be collected 2hrs out from last.

## 2020-05-14 LAB — BASIC METABOLIC PANEL
Anion gap: 11 (ref 5–15)
BUN: 27 mg/dL — ABNORMAL HIGH (ref 6–20)
CO2: 27 mmol/L (ref 22–32)
Calcium: 9.3 mg/dL (ref 8.9–10.3)
Chloride: 101 mmol/L (ref 98–111)
Creatinine, Ser: 1.18 mg/dL (ref 0.61–1.24)
GFR calc Af Amer: >60 mL/min (ref >60)
GFR calc non Af Amer: >60 mL/min (ref >60)
Glucose, Bld: 203 mg/dL — ABNORMAL HIGH (ref 70–99)
Potassium: 3.6 mmol/L (ref 3.5–5.1)
Sodium: 139 mmol/L (ref 135–145)

## 2020-05-14 LAB — CBC WITH DIFFERENTIAL/PLATELET
Abs Immature Granulocytes: 0.08 10*3/uL — ABNORMAL HIGH (ref 0.00–0.07)
Basophils Absolute: 0.1 10*3/uL (ref 0.0–0.1)
Basophils Relative: 1 %
Eosinophils Absolute: 0.2 10*3/uL (ref 0.0–0.5)
Eosinophils Relative: 2 %
HCT: 48.9 % (ref 39.0–52.0)
Hemoglobin: 16.6 g/dL (ref 13.0–17.0)
Immature Granulocytes: 1 %
Lymphocytes Relative: 26 %
Lymphs Abs: 2.5 10*3/uL (ref 0.7–4.0)
MCH: 29.4 pg (ref 26.0–34.0)
MCHC: 33.9 g/dL (ref 30.0–36.0)
MCV: 86.7 fL (ref 80.0–100.0)
Monocytes Absolute: 0.7 10*3/uL (ref 0.1–1.0)
Monocytes Relative: 7 %
Neutro Abs: 6.1 10*3/uL (ref 1.7–7.7)
Neutrophils Relative %: 63 %
Platelets: 212 10*3/uL (ref 150–400)
RBC: 5.64 MIL/uL (ref 4.22–5.81)
RDW: 13.1 % (ref 11.5–15.5)
WBC: 9.7 10*3/uL (ref 4.0–10.5)
nRBC: 0 % (ref 0.0–0.2)

## 2020-05-14 LAB — GLUCOSE, CAPILLARY
Glucose-Capillary: 228 mg/dL — ABNORMAL HIGH (ref 70–99)
Glucose-Capillary: 215 mg/dL — ABNORMAL HIGH (ref 70–99)

## 2020-05-14 LAB — MAGNESIUM: Magnesium: 1.9 mg/dL (ref 1.7–2.4)

## 2020-05-14 MED ORDER — LIDOCAINE 5 % EX PTCH: 1.0000 | MEDICATED_PATCH | CUTANEOUS | 0 refills | Status: DC

## 2020-05-14 NOTE — Plan of Care (Signed)

## 2020-05-14 NOTE — Discharge Summary (Signed)
Physician Discharge Summary  Patient ID: Gabriel [name]. MRN: [id number] DOB/AGE: [birth year] 49 y.o.  Admit date: 05/12/2020 Discharge date: 05/14/2020  Admission Diagnoses:  Discharge Diagnoses:  Active Problems:   Essential hypertension   Elevated troponin   Other chest pain   Acute on chronic systolic (congestive) heart failure ([place])   Uncontrolled type 2 diabetes mellitus with hyperglycemia, with long-term current use of insulin ([place])   Discharged Condition: good  Hospital Course:  Gabriel Fordis a 49 y.o.malewith medical history significant ofcoronary artery disease, chronic systolic congestive heart failure, essential hypertension, type 2 diabetes, COPD, chronic kidney disease stage II, who present to the hospital with chest pain and shortness of breath. Physical examination showed chest wall tenderness in the left upper chest.  X-ray showed vascular congestion.  Elevated BNP at 1168.  CT angiogram ruled out aortic dissection.  Patient was started on IV Lasix and pain medicine as needed.  9/25.  Chest pain is slightly better.  Continue IV Lasix.  9/26.  Patient doing well.  Not short of breath.  Chest pain much better.  Medically stable to be discharged.  #1. Acute on chronic systolic congestive heart failure. Condition improved, no need for IV Lasix.  Continue Entresto and beta-blocker.  Resume home dose oral diuretics.  2.  Chest pain, other. Patient was taking anticoagulation, CT scan showed decreased clot from previous PE.  No aortic dissection.  Chest pain most likely secondary to skeletal muscular pain.    Chest pain is much better today.  I will prescribe a few days of Lidoderm patch. Due to rapid improvement in the chest pain, pericarditis less likely.  Will discontinue echocardiogram which has not been performed.   3.  Mild elevation troponin.  Secondary to exacerbation congestive heart failure.   4.  Uncontrolled type 2 diabetes with  hyperglycemia. Resume home home dose insulins.  5.  Essential hypertension.  Continue home medicines.  6.  COPD. Still stable.  7.  Hypokalemia. Supplement.  Consults: None  Significant Diagnostic Studies:  CT ANGIOGRAPHY CHEST WITH CONTRAST  TECHNIQUE: Multidetector CT imaging of the chest was performed using the standard protocol during bolus administration of intravenous contrast. Multiplanar CT image reconstructions and MIPs were obtained to evaluate the vascular anatomy.  CONTRAST:  170m OMNIPAQUE IOHEXOL 350 MG/ML SOLN  COMPARISON:  12/31/2019  FINDINGS: Cardiovascular: This is a technically adequate evaluation of the thoracic aorta. No evidence of aneurysm or dissection. No significant atherosclerosis.  The heart is mildly enlarged but stable. No pericardial effusion. Minimal atherosclerosis within the coronary vasculature, most pronounced in the LAD and circumflex distributions.  Mediastinum/Nodes: Stable borderline enlarged mediastinal and hilar adenopathy, measuring up to 2 cm in the subcarinal region. Thyroid, trachea, and esophagus are unremarkable.  Lungs/Pleura: Residual consolidation in the right lower lobe likely reflects resolving pulmonary infarct after previous right lower lobe pulmonary embolus. There are small bilateral pleural effusions, less than 200 cc each. No acute airspace disease. Minimal left upper lobe atelectasis. No pneumothorax. The central airways are patent.  Upper Abdomen: No acute abnormality.  Musculoskeletal: Stable bone island right third rib. No acute or destructive bony lesions. Reconstructed images demonstrate no additional findings.  Review of the MIP images confirms the above findings.  IMPRESSION: 1. No evidence of thoracic aortic aneurysm or dissection. 2. Stable mediastinal and hilar lymphadenopathy, nonspecific. 3. Improving right lower lobe consolidation, likely representing resolving pulmonary  infarct after previous right lower lobe pulmonary embolus. 4. Trace bilateral pleural effusions. 5.  Prominent coronary artery atherosclerosis, stable.   Electronically Signed   By: Randa Ngo M.D.   On: 05/12/2020 18:28    Treatments: Lasix  Discharge Exam: Blood pressure (!) 131/94, pulse 72, temperature 97.8 F (36.6 C), temperature source Oral, resp. rate 14, height 6' (1.829 m), weight 110.7 kg, SpO2 96 %. General appearance: alert, cooperative and Oriented to time place and person Resp: clear to auscultation bilaterally Cardio: Irregular, no tachycardia, no murmurs GI: soft, non-tender; bowel sounds normal; no masses,  no organomegaly Extremities: extremities normal, atraumatic, no cyanosis or edema  Disposition: Discharge disposition: 01-Home or Self Care       Discharge Instructions    Diet - low sodium heart healthy   Complete by: As directed    Fluid restriction 1539m/day   Increase activity slowly   Complete by: As directed      Allergies as of 05/14/2020      Reactions   Metformin And Related Nausea And Vomiting   Prednisone Other (See Comments)   Reaction: Hallucinations    Zantac [ranitidine Hcl] Diarrhea, Nausea Only   Night sweats      Medication List    TAKE these medications   apixaban 5 MG Tabs tablet Commonly known as: ELIQUIS Take 1 tablet (5 mg total) by mouth 2 (two) times daily.   Basaglar KwikPen 100 UNIT/ML Inject 0.28 mLs (28 Units total) into the skin at bedtime.   blood glucose meter kit and supplies Kit Dispense based on patient and insurance preference. Use up to four times daily as directed. (FOR ICD-9 250.00, 250.01).   carvedilol 25 MG tablet Commonly known as: COREG Take 1 tablet (25 mg total) by mouth 2 (two) times daily with a meal.   cloNIDine 0.1 MG tablet Commonly known as: CATAPRES Take 0.5 tablets (0.05 mg total) by mouth 2 (two) times daily. Takes 1/2 tablet twice daily What changed:   how much to  take  additional instructions   fenofibrate 145 MG tablet Commonly known as: Tricor Take 1 tablet (145 mg total) by mouth daily.   insulin lispro 100 UNIT/ML KwikPen Commonly known as: HUMALOG Inject 0.03 mLs (3 Units total) into the skin 3 (three) times daily before meals.   Insulin Pen Needle 29G X 5MM Misc 1 Dose by Does not apply route 4 (four) times daily -  with meals and at bedtime.   Ipratropium-Albuterol 20-100 MCG/ACT Aers respimat Commonly known as: COMBIVENT Inhale 1 puff into the lungs every 6 (six) hours.   lidocaine 5 % Commonly known as: LIDODERM Place 1 patch onto the skin daily. Remove & Discard patch within 12 hours or as directed by MD   nitroGLYCERIN 0.4 MG SL tablet Commonly known as: NITROSTAT Place 1 tablet (0.4 mg total) under the tongue every 5 (five) minutes x 3 doses as needed for chest pain.   rosuvastatin 20 MG tablet Commonly known as: CRESTOR Take 1 tablet (20 mg total) by mouth at bedtime.   sacubitril-valsartan 49-51 MG Commonly known as: ENTRESTO Take 1 tablet by mouth 2 (two) times daily.   spironolactone 25 MG tablet Commonly known as: ALDACTONE Take 1 tablet (25 mg total) by mouth 2 (two) times daily.   torsemide 20 MG tablet Commonly known as: DEMADEX Take 2 tablets (40 mg total) by mouth 2 (two) times daily.       [place]-up Information    [place].   Specialty: Cardiology Why: at 11:00am.  Enter through the [facility] information: [place] [address] [phone]       Minna Merritts, MD Follow up in 1 week(s).   Specialty: Cardiology Contact information: [address] [address] [phone]               Signed: Sharen Hones 05/14/2020, 8:10 AM

## 2020-05-14 NOTE — Discharge Instructions (Signed)
Information on my medicine - ELIQUIS (apixaban)  This medication education was reviewed with me or my healthcare representative as part of my discharge preparation.  The pharmacist that spoke with me during my hospital stay was:  Berta Minor, [facility]  Why was Eliquis prescribed for you? Eliquis was prescribed for you to reduce the risk of forming blood clots that can cause a stroke if you have a medical condition called atrial fibrillation (a type of irregular heartbeat) OR to reduce the risk of a blood clots forming after orthopedic surgery.  What do You need to know about Eliquis ? Take your Eliquis TWICE DAILY - one tablet in the morning and one tablet in the evening with or without food.  It would be best to take the doses about the same time each day.   Patient instructions: Lower salt diet, fluid restriction less than 1558ml/day Follow-up with PCP in 1 week.  Follow-up with heart failure clinic as scheduled. If you have difficulty swallowing the tablet whole please discuss with your pharmacist how to take the medication safely.  Take Eliquis exactly as prescribed by your doctor and DO NOT stop taking Eliquis without talking to the doctor who prescribed the medication.  Stopping may increase your risk of developing a new clot or stroke.  Refill your prescription before you run out.  After discharge, you should have regular check-up appointments with your healthcare provider that is prescribing your Eliquis.  In the future your dose may need to be changed if your kidney function or weight changes by a significant amount or as you get older.  What do you do if you miss a dose? If you miss a dose, take it as soon as you remember on the same day and resume taking twice daily.  Do not take more than one dose of ELIQUIS at the same time.  Important Safety Information A possible side effect of Eliquis is bleeding. You should call your healthcare provider right away if you experience any  of the following: ? Bleeding from an injury or your nose that does not stop. ? Unusual colored urine (red or dark brown) or unusual colored stools (red or black). ? Unusual bruising for unknown reasons. ? A serious fall or if you hit your head (even if there is no bleeding).  Some medicines may interact with Eliquis and might increase your risk of bleeding or clotting while on Eliquis. To help avoid this, consult your healthcare provider or pharmacist prior to using any new prescription or non-prescription medications, including herbals, vitamins, non-steroidal anti-inflammatory drugs (NSAIDs) and supplements.  This website has more information on Eliquis (apixaban): [email or web address].no.

## 2020-05-25 ENCOUNTER — Ambulatory Visit: Payer: Medicaid Other | Admitting: Family

## 2020-06-07 ENCOUNTER — Encounter: Payer: Self-pay | Admitting: Family

## 2020-06-07 ENCOUNTER — Emergency Department: Payer: Medicaid Other

## 2020-06-07 ENCOUNTER — Other Ambulatory Visit: Payer: Self-pay

## 2020-06-07 ENCOUNTER — Inpatient Hospital Stay
Admission: EM | Admit: 2020-06-07 | Discharge: 2020-06-09 | DRG: 291 | Disposition: A | Discharge status: Home / Self Care | Payer: Medicaid Other | Attending: Internal Medicine | Admitting: Internal Medicine

## 2020-06-07 ENCOUNTER — Ambulatory Visit: Payer: Medicaid Other | Admitting: Family

## 2020-06-07 VITALS — BP 147/101 | HR 85 | Resp 20 | Ht 72.0 in | Wt 262.0 lb

## 2020-06-07 DIAGNOSIS — J441 Chronic obstructive pulmonary disease with (acute) exacerbation: Secondary | ICD-10-CM | POA: Diagnosis present

## 2020-06-07 DIAGNOSIS — Z79899 Other long term (current) drug therapy: Secondary | ICD-10-CM | POA: Diagnosis not present

## 2020-06-07 DIAGNOSIS — E669 Obesity, unspecified: Secondary | ICD-10-CM | POA: Diagnosis present

## 2020-06-07 DIAGNOSIS — Z9114 Patient's other noncompliance with medication regimen: Secondary | ICD-10-CM | POA: Insufficient documentation

## 2020-06-07 DIAGNOSIS — J449 Chronic obstructive pulmonary disease, unspecified: Secondary | ICD-10-CM | POA: Insufficient documentation

## 2020-06-07 DIAGNOSIS — R079 Chest pain, unspecified: Secondary | ICD-10-CM | POA: Insufficient documentation

## 2020-06-07 DIAGNOSIS — I25118 Atherosclerotic heart disease of native coronary artery with other forms of angina pectoris: Secondary | ICD-10-CM | POA: Diagnosis present

## 2020-06-07 DIAGNOSIS — I5023 Acute on chronic systolic (congestive) heart failure: Secondary | ICD-10-CM | POA: Insufficient documentation

## 2020-06-07 DIAGNOSIS — I2699 Other pulmonary embolism without acute cor pulmonale: Secondary | ICD-10-CM

## 2020-06-07 DIAGNOSIS — Z86711 Personal history of pulmonary embolism: Secondary | ICD-10-CM

## 2020-06-07 DIAGNOSIS — I10 Essential (primary) hypertension: Secondary | ICD-10-CM | POA: Diagnosis not present

## 2020-06-07 DIAGNOSIS — Z9119 Patient's noncompliance with other medical treatment and regimen: Secondary | ICD-10-CM

## 2020-06-07 DIAGNOSIS — E1122 Type 2 diabetes mellitus with diabetic chronic kidney disease: Secondary | ICD-10-CM | POA: Diagnosis present

## 2020-06-07 DIAGNOSIS — E785 Hyperlipidemia, unspecified: Secondary | ICD-10-CM | POA: Diagnosis present

## 2020-06-07 DIAGNOSIS — I130 Hypertensive heart and chronic kidney disease with heart failure and stage 1 through stage 4 chronic kidney disease, or unspecified chronic kidney disease: Secondary | ICD-10-CM | POA: Diagnosis not present

## 2020-06-07 DIAGNOSIS — I509 Heart failure, unspecified: Secondary | ICD-10-CM

## 2020-06-07 DIAGNOSIS — Z87891 Personal history of nicotine dependence: Secondary | ICD-10-CM | POA: Insufficient documentation

## 2020-06-07 DIAGNOSIS — F17210 Nicotine dependence, cigarettes, uncomplicated: Secondary | ICD-10-CM | POA: Diagnosis present

## 2020-06-07 DIAGNOSIS — R0602 Shortness of breath: Secondary | ICD-10-CM | POA: Diagnosis present

## 2020-06-07 DIAGNOSIS — R0601 Orthopnea: Secondary | ICD-10-CM | POA: Insufficient documentation

## 2020-06-07 DIAGNOSIS — Z809 Family history of malignant neoplasm, unspecified: Secondary | ICD-10-CM

## 2020-06-07 DIAGNOSIS — Z833 Family history of diabetes mellitus: Secondary | ICD-10-CM | POA: Diagnosis not present

## 2020-06-07 DIAGNOSIS — N189 Chronic kidney disease, unspecified: Secondary | ICD-10-CM | POA: Insufficient documentation

## 2020-06-07 DIAGNOSIS — Z20822 Contact with and (suspected) exposure to covid-19: Secondary | ICD-10-CM | POA: Diagnosis present

## 2020-06-07 DIAGNOSIS — N183 Chronic kidney disease, stage 3 unspecified: Secondary | ICD-10-CM | POA: Diagnosis present

## 2020-06-07 DIAGNOSIS — Z8249 Family history of ischemic heart disease and other diseases of the circulatory system: Secondary | ICD-10-CM | POA: Diagnosis not present

## 2020-06-07 DIAGNOSIS — E1165 Type 2 diabetes mellitus with hyperglycemia: Secondary | ICD-10-CM

## 2020-06-07 DIAGNOSIS — L409 Psoriasis, unspecified: Secondary | ICD-10-CM | POA: Diagnosis present

## 2020-06-07 DIAGNOSIS — Z7901 Long term (current) use of anticoagulants: Secondary | ICD-10-CM

## 2020-06-07 DIAGNOSIS — R739 Hyperglycemia, unspecified: Secondary | ICD-10-CM

## 2020-06-07 DIAGNOSIS — Z888 Allergy status to other drugs, medicaments and biological substances status: Secondary | ICD-10-CM | POA: Insufficient documentation

## 2020-06-07 DIAGNOSIS — R0789 Other chest pain: Secondary | ICD-10-CM

## 2020-06-07 DIAGNOSIS — R0600 Dyspnea, unspecified: Secondary | ICD-10-CM | POA: Insufficient documentation

## 2020-06-07 DIAGNOSIS — R7989 Other specified abnormal findings of blood chemistry: Secondary | ICD-10-CM

## 2020-06-07 DIAGNOSIS — Z6834 Body mass index (BMI) 34.0-34.9, adult: Secondary | ICD-10-CM

## 2020-06-07 DIAGNOSIS — Z91148 Patient's other noncompliance with medication regimen for other reason: Secondary | ICD-10-CM

## 2020-06-07 DIAGNOSIS — I2510 Atherosclerotic heart disease of native coronary artery without angina pectoris: Secondary | ICD-10-CM | POA: Diagnosis present

## 2020-06-07 DIAGNOSIS — I428 Other cardiomyopathies: Secondary | ICD-10-CM | POA: Diagnosis present

## 2020-06-07 DIAGNOSIS — I5021 Acute systolic (congestive) heart failure: Secondary | ICD-10-CM | POA: Diagnosis not present

## 2020-06-07 DIAGNOSIS — Z794 Long term (current) use of insulin: Secondary | ICD-10-CM

## 2020-06-07 DIAGNOSIS — E1169 Type 2 diabetes mellitus with other specified complication: Secondary | ICD-10-CM

## 2020-06-07 DIAGNOSIS — R778 Other specified abnormalities of plasma proteins: Secondary | ICD-10-CM

## 2020-06-07 DIAGNOSIS — E119 Type 2 diabetes mellitus without complications: Secondary | ICD-10-CM

## 2020-06-07 HISTORY — DX: Other pulmonary embolism without acute cor pulmonale: I26.99

## 2020-06-07 LAB — HEPATIC FUNCTION PANEL
ALT: 32 U/L (ref 0–44)
AST: 25 U/L (ref 15–41)
Albumin: 3.2 g/dL — ABNORMAL LOW (ref 3.5–5.0)
Alkaline Phosphatase: 46 U/L (ref 38–126)
Bilirubin, Direct: 0.3 mg/dL — ABNORMAL HIGH (ref 0.0–0.2)
Indirect Bilirubin: 1.9 mg/dL — ABNORMAL HIGH (ref 0.3–0.9)
Total Bilirubin: 2.2 mg/dL — ABNORMAL HIGH (ref 0.3–1.2)
Total Protein: 6.3 g/dL — ABNORMAL LOW (ref 6.5–8.1)

## 2020-06-07 LAB — RESPIRATORY PANEL BY RT PCR (FLU A&B, COVID)
Influenza A by PCR: NEGATIVE
Influenza B by PCR: NEGATIVE
SARS Coronavirus 2 by RT PCR: NEGATIVE

## 2020-06-07 LAB — CBC
HCT: 46.1 % (ref 39.0–52.0)
Hemoglobin: 15.3 g/dL (ref 13.0–17.0)
MCH: 28.1 pg (ref 26.0–34.0)
MCHC: 33.2 g/dL (ref 30.0–36.0)
MCV: 84.7 fL (ref 80.0–100.0)
Platelets: 192 10*3/uL (ref 150–400)
RBC: 5.44 MIL/uL (ref 4.22–5.81)
RDW: 13.9 % (ref 11.5–15.5)
WBC: 9.4 10*3/uL (ref 4.0–10.5)
nRBC: 0 % (ref 0.0–0.2)

## 2020-06-07 LAB — BRAIN NATRIURETIC PEPTIDE: B Natriuretic Peptide: 1566.8 pg/mL — ABNORMAL HIGH (ref 0.0–100.0)

## 2020-06-07 LAB — BASIC METABOLIC PANEL
Anion gap: 8 (ref 5–15)
BUN: 21 mg/dL — ABNORMAL HIGH (ref 6–20)
CO2: 24 mmol/L (ref 22–32)
Calcium: 8.5 mg/dL — ABNORMAL LOW (ref 8.9–10.3)
Chloride: 105 mmol/L (ref 98–111)
Creatinine, Ser: 1.27 mg/dL — ABNORMAL HIGH (ref 0.61–1.24)
GFR, Estimated: >60 mL/min (ref >60)
Glucose, Bld: 222 mg/dL — ABNORMAL HIGH (ref 70–99)
Potassium: 4.3 mmol/L (ref 3.5–5.1)
Sodium: 137 mmol/L (ref 135–145)

## 2020-06-07 LAB — PROCALCITONIN: Procalcitonin: <0.1 ng/mL

## 2020-06-07 LAB — PROTIME-INR
INR: 1.1 (ref 0.8–1.2)
Prothrombin Time: 13.3 seconds (ref 11.4–15.2)

## 2020-06-07 LAB — MAGNESIUM: Magnesium: 1.8 mg/dL (ref 1.7–2.4)

## 2020-06-07 LAB — TROPONIN I (HIGH SENSITIVITY)
Troponin I (High Sensitivity): 55 ng/L — ABNORMAL HIGH (ref <18)
Troponin I (High Sensitivity): 59 ng/L — ABNORMAL HIGH (ref <18)

## 2020-06-07 LAB — GLUCOSE, CAPILLARY
Glucose-Capillary: 224 mg/dL — ABNORMAL HIGH (ref 70–99)
Glucose-Capillary: 236 mg/dL — ABNORMAL HIGH (ref 70–99)

## 2020-06-07 LAB — LIPASE, BLOOD: Lipase: 26 U/L (ref 11–51)

## 2020-06-07 MED ORDER — ACETAMINOPHEN 325 MG PO TABS: 650.0000 mg | ORAL_TABLET | ORAL | Status: DC | PRN

## 2020-06-07 MED ORDER — ONDANSETRON HCL 4 MG/2ML IJ SOLN
4.0000 mg | Freq: Four times a day (QID) | INTRAMUSCULAR | Status: DC | PRN
  Administered 2020-06-08: 4 mg via INTRAVENOUS
  Filled 2020-06-07: qty 2

## 2020-06-07 MED ORDER — IPRATROPIUM-ALBUTEROL 0.5-2.5 (3) MG/3ML IN SOLN
3.0000 mL | Freq: Once | RESPIRATORY_TRACT | Status: AC
  Administered 2020-06-07: 3 mL via RESPIRATORY_TRACT
  Filled 2020-06-07: qty 3

## 2020-06-07 MED ORDER — INSULIN ASPART 100 UNIT/ML ~~LOC~~ SOLN: 0.0000 [IU] | Freq: Every day | SUBCUTANEOUS | Status: DC

## 2020-06-07 MED ORDER — SODIUM CHLORIDE 0.9% FLUSH
3.0000 mL | Freq: Two times a day (BID) | INTRAVENOUS | Status: DC
  Administered 2020-06-08 – 2020-06-09 (×3): 3 mL via INTRAVENOUS

## 2020-06-07 MED ORDER — INSULIN ASPART 100 UNIT/ML ~~LOC~~ SOLN
0.0000 [IU] | SUBCUTANEOUS | Status: DC
  Administered 2020-06-08: 3 [IU] via SUBCUTANEOUS
  Administered 2020-06-08: 5 [IU] via SUBCUTANEOUS
  Administered 2020-06-08: 3 [IU] via SUBCUTANEOUS
  Administered 2020-06-08: 5 [IU] via SUBCUTANEOUS
  Filled 2020-06-07 (×4): qty 1

## 2020-06-07 MED ORDER — CLONIDINE HCL 0.1 MG PO TABS
0.0500 mg | ORAL_TABLET | Freq: Two times a day (BID) | ORAL | Status: DC
  Administered 2020-06-07 – 2020-06-09 (×4): 0.05 mg via ORAL
  Filled 2020-06-07 (×4): qty 1

## 2020-06-07 MED ORDER — SODIUM CHLORIDE 0.9 % IV SOLN: 250.0000 mL | INTRAVENOUS | Status: DC | PRN

## 2020-06-07 MED ORDER — FUROSEMIDE 10 MG/ML IJ SOLN
60.0000 mg | Freq: Two times a day (BID) | INTRAMUSCULAR | Status: DC
  Administered 2020-06-08 – 2020-06-09 (×3): 60 mg via INTRAVENOUS
  Filled 2020-06-07: qty 6
  Filled 2020-06-07 (×2): qty 8

## 2020-06-07 MED ORDER — FUROSEMIDE 10 MG/ML IJ SOLN
80.0000 mg | Freq: Once | INTRAMUSCULAR | Status: AC
  Administered 2020-06-07: 80 mg via INTRAVENOUS
  Filled 2020-06-07: qty 8

## 2020-06-07 MED ORDER — NITROGLYCERIN 0.4 MG SL SUBL
0.4000 mg | SUBLINGUAL_TABLET | SUBLINGUAL | Status: DC | PRN
  Filled 2020-06-07: qty 1

## 2020-06-07 MED ORDER — HEPARIN (PORCINE) 25000 UT/250ML-% IV SOLN
1950.0000 [IU]/h | INTRAVENOUS | Status: DC
  Administered 2020-06-07: 1750 [IU]/h via INTRAVENOUS
  Filled 2020-06-07: qty 250

## 2020-06-07 MED ORDER — ROSUVASTATIN CALCIUM 10 MG PO TABS
20.0000 mg | ORAL_TABLET | Freq: Every day | ORAL | Status: DC
  Administered 2020-06-08: 20 mg via ORAL
  Filled 2020-06-07 (×2): qty 2
  Filled 2020-06-07 (×2): qty 1

## 2020-06-07 MED ORDER — INSULIN ASPART 100 UNIT/ML ~~LOC~~ SOLN
0.0000 [IU] | Freq: Three times a day (TID) | SUBCUTANEOUS | Status: DC
  Administered 2020-06-08: 5 [IU] via SUBCUTANEOUS
  Administered 2020-06-08 (×2): 3 [IU] via SUBCUTANEOUS
  Administered 2020-06-09: 5 [IU] via SUBCUTANEOUS
  Administered 2020-06-09: 3 [IU] via SUBCUTANEOUS
  Filled 2020-06-07 (×5): qty 1

## 2020-06-07 MED ORDER — SODIUM CHLORIDE 0.9% FLUSH: 3.0000 mL | INTRAVENOUS | Status: DC | PRN

## 2020-06-07 MED ORDER — CARVEDILOL 25 MG PO TABS
25.0000 mg | ORAL_TABLET | Freq: Two times a day (BID) | ORAL | Status: DC
  Administered 2020-06-07 – 2020-06-09 (×4): 25 mg via ORAL
  Filled 2020-06-07 (×4): qty 1

## 2020-06-07 MED ORDER — HEPARIN BOLUS VIA INFUSION
6200.0000 [IU] | Freq: Once | INTRAVENOUS | Status: AC
  Administered 2020-06-07: 6200 [IU] via INTRAVENOUS
  Filled 2020-06-07: qty 6200

## 2020-06-07 MED ORDER — IOHEXOL 350 MG/ML SOLN
75.0000 mL | Freq: Once | INTRAVENOUS | Status: AC | PRN
  Administered 2020-06-07: 75 mL via INTRAVENOUS

## 2020-06-07 NOTE — ED Triage Notes (Signed)
Pt comes from CHF clinic with increasing CP and SOB. MD from CHF clinic wants to admit pt. Pt tachypnic and has labored breathing. Swelling of extremities.

## 2020-06-07 NOTE — ED Notes (Signed)
Called lab to add on troponin  

## 2020-06-07 NOTE — Progress Notes (Signed)
ANTICOAGULATION CONSULT NOTE - Initial Consult  Pharmacy Consult for Heparin infusion Indication: pulmonary embolus  Allergies  Allergen Reactions  . Metformin And Related Nausea And Vomiting  . Prednisone Other (See Comments)    Reaction: Hallucinations    . Zantac [Ranitidine Hcl] Diarrhea and Nausea Only    Night sweats    Patient Measurements: Height: 6' (182.9 cm) Weight: 118.8 kg (262 lb) IBW/kg (Calculated) : 77.6 Heparin Dosing Weight: 103.6 kg  Vital Signs: Temp: 98.1 F (36.7 C) (10/20 1627) Temp Source: Oral (10/20 1627) BP: 148/101 (10/20 1627) Pulse Rate: 72 (10/20 1627)  Labs: Recent Labs    06/07/20 1144 06/07/20 1155 06/07/20 1714  HGB 15.3  --   --   HCT 46.1  --   --   PLT 192  --   --   LABPROT 13.3  --   --   INR 1.1  --   --   CREATININE 1.27*  --   --   TROPONINIHS  --  55* 59*    Estimated Creatinine Clearance: 91.6 mL/min (A) (by C-G formula based on SCr of 1.27 mg/dL (H)).   Medical History: Past Medical History:  Diagnosis Date  . Chest wall pain, chronic   . Chronic systolic CHF (congestive heart failure) ([place])    a. 03/2015 Echo: EF 45-50%; b. 12/2015 Echo: EF 20-25%; c. 02/2016 Echo: EF 30-35%; d. 11/2016 Echo: EF 40-45%; e. 06/2019 Echo: EF 30-35%; f. 4/42021 Echo: EF 25-30%.  . Chronic Troponin Elevation   . CKD (chronic kidney disease), stage II-III   . COPD (chronic obstructive pulmonary disease) ([place])   . Diabetes mellitus without complication ([place])   . Hypertension    Resolved since weight loss  . NICM (nonischemic cardiomyopathy) ([place])    a. 03/2015 Echo: EF 45-50%; b. 12/2015 Echo: EF 20-25%; c. 02/2016 Echo: EF 30-35%; d. 11/2016 Echo: EF 40-45%; e. 06/2019 Echo: EF 30-35%; f. 11/2019 Echo: EF 25-30%, glob HK. Mild LVH. Gr2 DD. Mild-mod AI. BAE. Triv MR.  . Non-obstructive CAD (coronary artery disease)    a. 04/2015 low risk MV;  b. 12/2016 Cath: minor irregs in LAD/Diag/LCX/OM, RCA 40p/m/d.  [name] (nonsustained ventricular  tachycardia) ([place])    a. 12/2015 noted on tele-->amio;  b. 12/2015 Event monitor: no VT. Atach noted.  . Obesity (BMI 30.0-34.9)   . Psoriasis   . Syncope    a. 01/2016 - felt to be vasovagal.  . Tobacco abuse     Medications:  Pharmacy technician confirmed that patient has not taken Apixaban 5mg  BID in 2 months.   Assessment: 49yo male with PMH HTN, diabetes, COPD, CKD, asthma, chronic heart failure, previous tobacco use, and PE (has not taken Apixaban in 2 months). Patient presented to ED after being referred by the heart failure clinic for admission due to concerns for decompensated heart failure. Patient states that since he was last hospitalized approximately a month ago he has had worsening SOB associated with some substernal chest pressure, cough, weight gain, abdominal distention. Patient states he has chest pressure and SOB all the time which are both exacerbated by exertion. HS troponin 55>59. 10/20 CTA chest shows evidence of volume overload as well as small pulmonary emboli. Baseline labs WNL. Pharmacy has been consulted for heparin dosing and monitoring for PE.  Goal of Therapy:  Heparin level 0.3-0.7 units/ml Monitor platelets by anticoagulation protocol: Yes   Plan:  Give 6200 units bolus x 1 Start heparin infusion at 1750 units/hr Check anti-Xa  level in 6 hours and daily while on heparin Continue to monitor H&H and platelets  Sherilyn Banker, PharmD Pharmacy Resident  06/07/2020 6:44 PM

## 2020-06-07 NOTE — ED Notes (Signed)
Pt given ice chips

## 2020-06-07 NOTE — Progress Notes (Signed)
Patient ID: Gabriel Dittus., male    DOB: 1971-02-19, 49 y.o.   MRN: [id number]  HPI  Gabriel [name] is a 49 y/o male with a history of HTN, diabetes, COPD, CKD, asthma, previous tobacco use and chronic heart failure.   Echo report from 11/23/19 reviewed and showed an EF of 25-30% with mild LVH and moderately elevated PA Pressure. Echo report from 06/26/2019 reviewed and showed an EF of 30-35% along with moderately elevated PA pressure. Echo done 04/29/17 showed EF 45-50%. Echo report that was done 11/28/16 and it showed an EF of 40-45% along with mild Gabriel. EF has improved from 30-35% July 2017.  Admitted 05/12/20 due to chest pain and acute/ chronic HF. Initially given IV lasix with transition to oral diuretics. Chest CT done and showed decreased clot from previous PE. No aortic dissection. Mildly elevated troponin due to HF exacerbation. Discharged after 2 days. Was in the ED 05/05/20 but left without being seen.   He presents today for a follow-up visit with a chief complaint of moderate shortness of breath with very little exertion. He describes this as chronic in nature having been present for several years although has been worsening since his discharge ~ 1 month ago. He has associated fatigue, head congestion, chronic cough, wheezing, chronic chest pain (worsening), pedal edema, abdominal distention (worsening), light-headedness, difficulty sleeping with PND and now orthopnea (sitting straight upright) along with weight gain.   He says that he's been out of all his medications for the last month and guesses that he's been out of entresto for the last year. Reports irritation with [facility] and medication management clinic because they keep asking "for all my information and sticking their nose in my business". Also says that he's not planning on returning to [facility] because he doesn't feel like they spent enough time with him after he drove all the way there.   Used to have  medicaid family planning but says that he's recently gotten a new card but has no idea if it's been switched to regular medicaid or not.   Past Medical History:  Diagnosis Date  . Chest wall pain, chronic   . Chronic systolic CHF (congestive heart failure) ([place])    a. 03/2015 Echo: EF 45-50%; b. 12/2015 Echo: EF 20-25%; c. 02/2016 Echo: EF 30-35%; d. 11/2016 Echo: EF 40-45%; e. 06/2019 Echo: EF 30-35%; f. 4/42021 Echo: EF 25-30%.  . Chronic Troponin Elevation   . CKD (chronic kidney disease), stage II-III   . COPD (chronic obstructive pulmonary disease) ([place])   . Diabetes mellitus without complication ([place])   . Hypertension    Resolved since weight loss  . NICM (nonischemic cardiomyopathy) ([place])    a. 03/2015 Echo: EF 45-50%; b. 12/2015 Echo: EF 20-25%; c. 02/2016 Echo: EF 30-35%; d. 11/2016 Echo: EF 40-45%; e. 06/2019 Echo: EF 30-35%; f. 11/2019 Echo: EF 25-30%, glob HK. Mild LVH. Gr2 DD. Mild-mod AI. BAE. Triv Gabriel.  . Non-obstructive CAD (coronary artery disease)    a. 04/2015 low risk MV;  b. 12/2016 Cath: minor irregs in LAD/Diag/LCX/OM, RCA 40p/m/d.  [name] (nonsustained ventricular tachycardia) ([place])    a. 12/2015 noted on tele-->amio;  b. 12/2015 Event monitor: no VT. Atach noted.  . Obesity (BMI 30.0-34.9)   . Psoriasis   . Syncope    a. 01/2016 - felt to be vasovagal.  . Tobacco abuse    Past Surgical History:  Procedure Laterality Date  . AMPUTATION    .  CARDIAC CATHETERIZATION    . FINGER AMPUTATION     Traumatic  . FINGER FRACTURE SURGERY Left   . LEFT HEART CATH AND CORONARY ANGIOGRAPHY N/A 01/06/2017   Procedure: Left Heart Cath and Coronary Angiography;  Surgeon: Wellington Hampshire, MD;  Location: [facility];  Service: Cardiovascular;  Laterality: N/A;   Family History  Problem Relation Age of Onset  . Diabetes Mellitus II Mother   . Hypothyroidism Mother   . Hypertension Mother   . Hypertension Father   . Gout Father   . Cancer Paternal Aunt   . Cancer Maternal  Grandfather   . Diabetes Maternal Grandfather    Social History   Tobacco Use  . Smoking status: Former Smoker    Packs/day: 0.50    Years: 33.00    Pack years: 16.50    Types: Cigarettes    Quit date: 05/08/2020    Years since quitting: 0.0  . Smokeless tobacco: Former Network engineer Use Topics  . Alcohol use: Yes    Alcohol/week: 0.0 standard drinks    Comment: occassionally   Allergies  Allergen Reactions  . Metformin And Related Nausea And Vomiting  . Prednisone Other (See Comments)    Reaction: Hallucinations    . Zantac [Ranitidine Hcl] Diarrhea and Nausea Only    Night sweats   Prior to Admission medications   Medication Sig Start Date End Date Taking? Authorizing Provider  apixaban (ELIQUIS) 5 MG TABS tablet Take 1 tablet (5 mg total) by mouth 2 (two) times daily. Patient not taking: Reported on 06/07/2020 01/05/20   Minna Merritts, MD  blood glucose meter kit and supplies KIT Dispense based on patient and insurance preference. Use up to four times daily as directed. (FOR ICD-9 250.00, 250.01). Patient not taking: Reported on 06/07/2020 03/02/20   Iloabachie, Chioma E, NP  carvedilol (COREG) 25 MG tablet Take 1 tablet (25 mg total) by mouth 2 (two) times daily with a meal. Patient not taking: Reported on 06/07/2020 03/08/20 05/12/20  Iloabachie, Chioma E, NP  cloNIDine (CATAPRES) 0.1 MG tablet Take 0.5 tablets (0.05 mg total) by mouth 2 (two) times daily. Takes 1/2 tablet twice daily Patient not taking: Reported on 06/07/2020 03/08/20 05/12/20  Iloabachie, Chioma E, NP  fenofibrate (TRICOR) 145 MG tablet Take 1 tablet (145 mg total) by mouth daily. Patient not taking: Reported on 06/07/2020 01/21/20   Larey Dresser, MD  Insulin Glargine [facility]) 100 UNIT/ML Inject 0.28 mLs (28 Units total) into the skin at bedtime. Patient not taking: Reported on 06/07/2020 03/16/20   Iloabachie, Chioma E, NP  insulin lispro (HUMALOG) 100 UNIT/ML KwikPen Inject 0.03 mLs (3  Units total) into the skin 3 (three) times daily before meals. Patient not taking: Reported on 06/07/2020 03/16/20   Iloabachie, Chioma E, NP  Insulin Pen Needle 29G X 5MM MISC 1 Dose by Does not apply route 4 (four) times daily -  with meals and at bedtime. Patient not taking: Reported on 06/07/2020 01/02/20   Loletha Grayer, MD  Ipratropium-Albuterol (COMBIVENT) 20-100 MCG/ACT AERS respimat Inhale 1 puff into the lungs every 6 (six) hours. Patient not taking: Reported on 05/12/2020 01/02/20   Loletha Grayer, MD  lidocaine (LIDODERM) 5 % Place 1 patch onto the skin daily. Remove & Discard patch within 12 hours or as directed by MD Patient not taking: Reported on 06/07/2020 05/14/20   Sharen Hones, MD  nitroGLYCERIN (NITROSTAT) 0.4 MG SL tablet Place 1 tablet (0.4 mg total) under  the tongue every 5 (five) minutes x 3 doses as needed for chest pain. Patient not taking: Reported on 06/07/2020 06/27/19   [name], MD  rosuvastatin (CRESTOR) 20 MG tablet Take 1 tablet (20 mg total) by mouth at bedtime. Patient not taking: Reported on 06/07/2020 01/21/20   Larey Dresser, MD  sacubitril-valsartan (ENTRESTO) 49-51 MG Take 1 tablet by mouth 2 (two) times daily. Patient not taking: Reported on 05/12/2020 03/07/20   Larey Dresser, MD  spironolactone (ALDACTONE) 25 MG tablet Take 1 tablet (25 mg total) by mouth 2 (two) times daily. Patient not taking: Reported on 06/07/2020 03/08/20 05/12/20  Iloabachie, Chioma E, NP  torsemide (DEMADEX) 20 MG tablet Take 2 tablets (40 mg total) by mouth 2 (two) times daily. Patient not taking: Reported on 06/07/2020 03/08/20   Caryl Asp E, NP    Review of Systems  Constitutional: Positive for fatigue ("all the time"). Negative for appetite change.  HENT: Positive for congestion and trouble swallowing (throat feels swollen"). Negative for postnasal drip and sore throat.   Eyes: Negative.   Respiratory: Positive for cough, shortness of breath and wheezing.  Negative for chest tightness.   Cardiovascular: Positive for chest pain and leg swelling (at times).  Gastrointestinal: Positive for abdominal distention (worsening). Negative for abdominal pain.  Endocrine: Negative.   Genitourinary: Negative.   Skin: Negative.   Allergic/Immunologic: Negative.   Neurological: Positive for light-headedness (on occasion). Negative for dizziness.  Hematological: Negative for adenopathy. Does not bruise/bleed easily.  Psychiatric/Behavioral: Positive for sleep disturbance (sleeping upright). Negative for dysphoric mood and suicidal ideas. The patient is not nervous/anxious.    Vitals:   06/07/20 1053  BP: (!) 147/101  Pulse: 85  Resp: 20  SpO2: 95%  Weight: 262 lb (118.8 kg)  Height: 6' (1.829 m)   Wt Readings from Last 3 Encounters:  06/07/20 262 lb (118.8 kg)  05/14/20 244 lb 0.8 oz (110.7 kg)  03/16/20 (!) 241 lb (109.3 kg)   Lab Results  Component Value Date   CREATININE 1.18 05/14/2020   CREATININE 1.16 05/13/2020   CREATININE 1.11 05/12/2020    Physical Exam Vitals and nursing note reviewed.  Constitutional:      Appearance: He is well-developed.  HENT:     Head: Normocephalic and atraumatic.  Neck:     Vascular: JVD present.  Cardiovascular:     Rate and Rhythm: Normal rate and regular rhythm.  Pulmonary:     Effort: Pulmonary effort is normal.     Breath sounds: Rales (few in LLL) present. No wheezing.  Chest:     Chest wall: No tenderness.  Abdominal:     General: There is distension.     Palpations: Abdomen is soft.     Tenderness: There is no abdominal tenderness.  Musculoskeletal:        General: Tenderness (anterior chest wall) present.     Cervical back: Normal range of motion and neck supple.     Right lower leg: No edema.     Left lower leg: Edema (trace pitting) present.  Skin:    General: Skin is warm and dry.  Neurological:     Mental Status: He is alert and oriented to person, place, and time.   Psychiatric:        Behavior: Behavior normal.        Thought Content: Thought content normal.    Assessment & Plan:  1: Acute on Chronic heart failure with reduced ejection fraction- - NYHA class  IV - moderately fluid overloaded today with weight gain, abdominal distention, PND & orthopnea - not weighing daily and reminded to call for an overnight weight gain of >2 pounds or a weekly weight gain of >5 pounds; weight at home has been rising.  - weight up 8 pounds since last visit here 5 months ago but 21 pounds from last PCP appt at [facility] 3 months ago - now has orthopnea (sitting straight up to sleep and PND) - says that he's not been on any medications for ~ 2 months and no entresto for "at least a year"; he's unsure of current medicaid status - not adding salt and he was encouraged to closely follow a 2096m sodium diet - saw cardiology (Rockey Situ 12/27/19 - saw EP (Caryl Comes 09/02/19; discussion about ICD  - has been on higher metoprolol dose before but was quite fatigued - will send patient to ED; patient was taken in a wheelchair by a volunteer and ED triage nurse was informed  - [facility] but says that he's not planning on returning - BNP from 05/12/20 was 1168.1  2: HTN- - BP elevated today but he's not been on any medications for the last 2 months - saw PCP @ [facility] 04/13/20 - BMP from 05/14/20/21 reviewed and showed sodium 139, potassium 3.6, creatinine 1.18 and GFR >60  3: Diabetes-   -  A1c 8.2% on 05/13/20 - fasting glucose in clinic today was 224 - says that he doesn't want to take metformin due to nausea/ vomiting after every dose; explained that he needed to follow-up with PCP regarding this - followed by [facility] - [facility]: Chest pain- - patient reports continued chest pain which is worsened by palpation - no medications for the last 2 months (per his report)  - since  sending patient to the ED, will not do an EKG in the office   Patient did not bring his medications nor a list. Each medication was verbally reviewed with the patient and he was encouraged to bring the bottles to every visit to confirm accuracy of list.   Due to worsening symptoms of weight gain, abdominal distention, PND and orthopnea, will send patient to ED for further evaluation. Patient was transported via wheelchair by a volunteer.   F/u appointment here pending ED evaluation.

## 2020-06-07 NOTE — ED Notes (Signed)
Pt placed on 2L [place] per Dr. Cheri Fowler

## 2020-06-07 NOTE — ED Provider Notes (Signed)
[facility] Emergency Department Provider Note  ____________________________________________   First MD Initiated Contact with Patient 06/07/20 1738     (approximate)  I have reviewed the triage vital signs and the nursing notes.   HISTORY  Chief Complaint Chest Pain and Shortness of Breath   HPI Gabriel N Janziel Hockett. is a 49 y.o. male with a history of HTN, diabetes, COPD, PE, CKD, asthma, previous tobacco use and chronic heart failure  who presents to the ED after being referred by the heart failure clinic for admission due to concerns for decompensated heart failure.  Patient states that since he was last hospitalized approximately a month ago he has had worsening shortness of breath associate with some substernal chest pressure, cough, weight gain, abdominal distention.  He has endorses some congestion and significant fatigue.  He states he has chest pressure and shortness of breath all the time these are both exacerbated by exertion.  No other clear alleviating aggravating factors.  This is similar to episodes that required hospitalization in the past.  He states he has to sleep upright.  He states he frequently wakes up at night gasping for air.  He states he stopped smoking 1 month ago.  He denies any vomiting, diarrhea, dysuria, rash, extremity pain, earache, sore throat, or other acute symptoms.  Denies EtOH or illicit drug use.  Patient notes he has not taken any of his medications in 1 to 2 months.   Past Medical History:  Diagnosis Date  . Chest wall pain, chronic   . Chronic systolic CHF (congestive heart failure) ([place])    a. 03/2015 Echo: EF 45-50%; b. 12/2015 Echo: EF 20-25%; c. 02/2016 Echo: EF 30-35%; d. 11/2016 Echo: EF 40-45%; e. 06/2019 Echo: EF 30-35%; f. 4/42021 Echo: EF 25-30%.  . Chronic Troponin Elevation   . CKD (chronic kidney disease), stage II-III   . COPD (chronic obstructive pulmonary disease) ([place])   . Diabetes mellitus without  complication ([name])   . Hypertension    Resolved since weight loss  . NICM (nonischemic cardiomyopathy) ([place])    a. 03/2015 Echo: EF 45-50%; b. 12/2015 Echo: EF 20-25%; c. 02/2016 Echo: EF 30-35%; d. 11/2016 Echo: EF 40-45%; e. 06/2019 Echo: EF 30-35%; f. 11/2019 Echo: EF 25-30%, glob HK. Mild LVH. Gr2 DD. Mild-mod AI. BAE. Triv MR.  . Non-obstructive CAD (coronary artery disease)    a. 04/2015 low risk MV;  b. 12/2016 Cath: minor irregs in LAD/Diag/LCX/OM, RCA 40p/m/d.  [name] (nonsustained ventricular tachycardia) ([place])    a. 12/2015 noted on tele-->amio;  b. 12/2015 Event monitor: no VT. Atach noted.  . Obesity (BMI 30.0-34.9)   . Psoriasis   . Syncope    a. 01/2016 - felt to be vasovagal.  . Tobacco abuse     Patient Active Problem List   Diagnosis Date Noted  . Uncontrolled type 2 diabetes mellitus with hyperglycemia, with long-term current use of insulin ([place]) 05/13/2020  . Acute on chronic systolic (congestive) heart failure ([place]) 05/12/2020  . Erectile dysfunction due to arterial insufficiency 03/15/2020  . Screening PSA (prostate specific antigen) 03/15/2020  . Erectile dysfunction 03/02/2020  . Pulmonary embolism, unspecified chronicity, unspecified pulmonary embolism type, unspecified whether acute cor pulmonale present ([place]) 01/03/2020  . Multiple subsegmental pulmonary emboli without acute cor pulmonale ([place])   . 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 Hemoptysis   . Costochondritis   . Acute respiratory failure with hypoxia ([place])   . Acute on chronic combined systolic (congestive)  and diastolic (congestive) heart failure ([place]) 12/20/2019  . CHF exacerbation ([place]) 11/22/2019  . Acute CHF (congestive heart failure) ([place]) 06/26/2019  . CKD stage 3 due to type 2 diabetes mellitus ([place]) 06/26/2019  . Type 2 diabetes mellitus with hyperlipidemia ([place]) 06/26/2019  . Hypertensive urgency 06/26/2019  . Acute on chronic combined systolic and diastolic CHF (congestive heart failure) ([place])  09/02/2017  . Other chest pain 08/24/2017  . Dizziness   . Noncompliance with medications 04/29/2017  . Back pain 03/06/2017  . Tobacco use 12/04/2016  . Gallbladder sludge 12/04/2016  . Acute on chronic systolic CHF (congestive heart failure) ([place]) 11/28/2016  . Coronary artery disease, non-occlusive 03/15/2016  . Atypical chest pain 02/16/2016  . Orthostatic hypotension 01/23/2016  . Hypomagnesemia 01/23/2016  . Chronic systolic CHF (congestive heart failure) ([place]) 01/23/2016  . NICM (nonischemic cardiomyopathy) ([place]) 01/17/2016  . NSVT (nonsustained ventricular tachycardia) ([place])   . Elevated troponin   . Hyperlipidemia 05/17/2015  . COPD (chronic obstructive pulmonary disease) ([place]) 05/17/2015  . Essential hypertension 03/31/2015    Past Surgical History:  Procedure Laterality Date  . AMPUTATION    . CARDIAC CATHETERIZATION    . FINGER AMPUTATION     Traumatic  . FINGER FRACTURE SURGERY Left   . LEFT HEART CATH AND CORONARY ANGIOGRAPHY N/A 01/06/2017   Procedure: Left Heart Cath and Coronary Angiography;  Surgeon: Wellington Hampshire, MD;  Location: [facility];  Service: Cardiovascular;  Laterality: N/A;    Prior to Admission medications   Medication Sig Start Date End Date Taking? Authorizing Provider  apixaban (ELIQUIS) 5 MG TABS tablet Take 1 tablet (5 mg total) by mouth 2 (two) times daily. Patient not taking: Reported on 06/07/2020 01/05/20   Minna Merritts, MD  blood glucose meter kit and supplies KIT Dispense based on patient and insurance preference. Use up to four times daily as directed. (FOR ICD-9 250.00, 250.01). Patient not taking: Reported on 06/07/2020 03/02/20   Iloabachie, Chioma E, NP  carvedilol (COREG) 25 MG tablet Take 1 tablet (25 mg total) by mouth 2 (two) times daily with a meal. Patient not taking: Reported on 06/07/2020 03/08/20 05/12/20  Iloabachie, Chioma E, NP  cloNIDine (CATAPRES) 0.1 MG tablet Take 0.5 tablets (0.05 mg total) by mouth 2  (two) times daily. Takes 1/2 tablet twice daily Patient not taking: Reported on 06/07/2020 03/08/20 05/12/20  Iloabachie, Chioma E, NP  fenofibrate (TRICOR) 145 MG tablet Take 1 tablet (145 mg total) by mouth daily. Patient not taking: Reported on 06/07/2020 01/21/20   Larey Dresser, MD  Insulin Glargine [facility]) 100 UNIT/ML Inject 0.28 mLs (28 Units total) into the skin at bedtime. Patient not taking: Reported on 06/07/2020 03/16/20   Iloabachie, Chioma E, NP  insulin lispro (HUMALOG) 100 UNIT/ML KwikPen Inject 0.03 mLs (3 Units total) into the skin 3 (three) times daily before meals. Patient not taking: Reported on 06/07/2020 03/16/20   Iloabachie, Chioma E, NP  Insulin Pen Needle 29G X 5MM MISC 1 Dose by Does not apply route 4 (four) times daily -  with meals and at bedtime. Patient not taking: Reported on 06/07/2020 01/02/20   Loletha Grayer, MD  Ipratropium-Albuterol (COMBIVENT) 20-100 MCG/ACT AERS respimat Inhale 1 puff into the lungs every 6 (six) hours. Patient not taking: Reported on 05/12/2020 01/02/20   Loletha Grayer, MD  lidocaine (LIDODERM) 5 % Place 1 patch onto the skin daily. Remove & Discard patch within 12 hours or as directed by MD Patient  not taking: Reported on 06/07/2020 05/14/20   Sharen Hones, MD  nitroGLYCERIN (NITROSTAT) 0.4 MG SL tablet Place 1 tablet (0.4 mg total) under the tongue every 5 (five) minutes x 3 doses as needed for chest pain. Patient not taking: Reported on 06/07/2020 06/27/19   [name], MD  rosuvastatin (CRESTOR) 20 MG tablet Take 1 tablet (20 mg total) by mouth at bedtime. Patient not taking: Reported on 06/07/2020 01/21/20   Larey Dresser, MD  sacubitril-valsartan (ENTRESTO) 49-51 MG Take 1 tablet by mouth 2 (two) times daily. Patient not taking: Reported on 05/12/2020 03/07/20   Larey Dresser, MD  spironolactone (ALDACTONE) 25 MG tablet Take 1 tablet (25 mg total) by mouth 2 (two) times daily. Patient not taking: Reported on 06/07/2020  03/08/20 05/12/20  Iloabachie, Chioma E, NP  torsemide (DEMADEX) 20 MG tablet Take 2 tablets (40 mg total) by mouth 2 (two) times daily. Patient not taking: Reported on 06/07/2020 03/08/20   Iloabachie, Chioma E, NP    Allergies Metformin and related, Prednisone, and Zantac [ranitidine hcl]  Family History  Problem Relation Age of Onset  . Diabetes Mellitus II Mother   . Hypothyroidism Mother   . Hypertension Mother   . Hypertension Father   . Gout Father   . Cancer Paternal Aunt   . Cancer Maternal Grandfather   . Diabetes Maternal Grandfather     Social History Social History   Tobacco Use  . Smoking status: Former Smoker    Packs/day: 0.50    Years: 33.00    Pack years: 16.50    Types: Cigarettes    Quit date: 05/08/2020    Years since quitting: 0.0  . Smokeless tobacco: Former Network engineer  . Vaping Use: Former  Substance Use Topics  . Alcohol use: Yes    Alcohol/week: 0.0 standard drinks    Comment: occassionally  . Drug use: No    Review of Systems  Review of Systems  Constitutional: Positive for malaise/fatigue. Negative for chills and fever.  HENT: Positive for congestion. Negative for sore throat.   Eyes: Negative for pain.  Respiratory: Positive for cough, sputum production and shortness of breath. Negative for stridor.   Cardiovascular: Positive for chest pain, palpitations, orthopnea and PND.  Gastrointestinal: Negative for vomiting.  Genitourinary: Negative for dysuria.  Musculoskeletal: Negative for myalgias.  Skin: Negative for rash.  Neurological: Negative for seizures, loss of consciousness and headaches.  Psychiatric/Behavioral: Negative for suicidal ideas.  All other systems reviewed and are negative.     ____________________________________________   PHYSICAL EXAM:  VITAL SIGNS: ED Triage Vitals  Enc Vitals Group     BP 06/07/20 1141 (!) 143/92     Pulse Rate 06/07/20 1141 76     Resp 06/07/20 1141 18     Temp 06/07/20 1141 (!)  97.4 F (36.3 C)     Temp Source 06/07/20 1141 Oral     SpO2 06/07/20 1141 95 %     Weight 06/07/20 1142 262 lb (118.8 kg)     Height 06/07/20 1142 6' (1.829 m)     Head Circumference --      Peak Flow --      Pain Score 06/07/20 1142 9     Pain Loc --      Pain Edu? --      Excl. in [place]? --    Vitals:   06/07/20 1141 06/07/20 1627  BP: (!) 143/92 (!) 148/101  Pulse: 76 72  Resp: 18  18  Temp: (!) 97.4 F (36.3 C) 98.1 F (36.7 C)  SpO2: 95% 99%   Physical Exam Vitals and nursing note reviewed.  Constitutional:      Appearance: He is well-developed. He is obese.  HENT:     Head: Normocephalic and atraumatic.     Right Ear: External ear normal.     Left Ear: External ear normal.     Nose: Nose normal.  Eyes:     Conjunctiva/sclera: Conjunctivae normal.  Neck:     Vascular: JVD present.  Cardiovascular:     Rate and Rhythm: Normal rate and regular rhythm.     Pulses: Normal pulses.     Heart sounds: No murmur heard.   Pulmonary:     Effort: Pulmonary effort is normal. No respiratory distress.     Breath sounds: Examination of the right-lower field reveals rales. Examination of the left-lower field reveals rales. Wheezing and rales present.  Abdominal:     General: There is distension.     Palpations: Abdomen is soft.     Tenderness: There is no abdominal tenderness.  Musculoskeletal:     Cervical back: Neck supple.     Right lower leg: Edema present.     Left lower leg: Edema present.  Skin:    General: Skin is warm and dry.     Capillary Refill: Capillary refill takes less than 2 seconds.  Neurological:     General: No focal deficit present.     Mental Status: He is alert.  Psychiatric:        Mood and Affect: Mood normal.      ____________________________________________   LABS (all labs ordered are listed, but only abnormal results are displayed)  Labs Reviewed  BASIC METABOLIC PANEL - Abnormal; Notable for the following components:      Result  Value   Glucose, Bld 222 (*)    BUN 21 (*)    Creatinine, Ser 1.27 (*)    Calcium 8.5 (*)    All other components within normal limits  BRAIN NATRIURETIC PEPTIDE - Abnormal; Notable for the following components:   B Natriuretic Peptide 1,566.8 (*)    All other components within normal limits  TROPONIN I (HIGH SENSITIVITY) - Abnormal; Notable for the following components:   Troponin I (High Sensitivity) 55 (*)    All other components within normal limits  TROPONIN I (HIGH SENSITIVITY) - Abnormal; Notable for the following components:   Troponin I (High Sensitivity) 59 (*)    All other components within normal limits  RESPIRATORY PANEL BY RT PCR (FLU A&B, COVID)  CBC  PROTIME-INR  MAGNESIUM  HEMOGLOBIN A1C  HEPATIC FUNCTION PANEL  LIPASE, BLOOD  PROCALCITONIN   ____________________________________________  EKG  Sinus rhythm with a ventricular rate of 76, right axis deviation, prolonged QTc interval at 43, nonspecific ST changes in the inferior leads no other clear evidence of acute ischemia. ____________________________________________  RADIOLOGY  ED MD interpretation: Cardiomegaly and pulmonary edema.  There is also bilateral effusions.  No consolidative process or pneumothorax.  Official radiology report(s): DG Chest 2 View  Result Date: 06/07/2020 CLINICAL DATA:  Chest pain and shortness of breath EXAM: CHEST - 2 VIEW COMPARISON:  Chest radiograph and chest CT May 12, 2020 FINDINGS: There is cardiomegaly with pulmonary vascular congestion. There are small joint effusions bilaterally with a degree of interstitial pulmonary edema. There is mild left midlung atelectasis. No consolidation. No adenopathy. No bone lesions. No pneumothorax. IMPRESSION: Cardiomegaly with pulmonary vascular congestion.  There is a degree of interstitial pulmonary edema with small pleural effusions. The overall appearance is felt to be indicative of a degree of congestive heart failure. No  consolidation. Mild atelectasis left mid lung noted. Electronically Signed   By: Lowella Grip III M.D.   On: 06/07/2020 12:13   CT Angio Chest PE W and/or Wo Contrast  Result Date: 06/07/2020 CLINICAL DATA:  Chest pain, shortness of breath EXAM: CT ANGIOGRAPHY CHEST WITH CONTRAST TECHNIQUE: Multidetector CT imaging of the chest was performed using the standard protocol during bolus administration of intravenous contrast. Multiplanar CT image reconstructions and MIPs were obtained to evaluate the vascular anatomy. CONTRAST:  3m OMNIPAQUE IOHEXOL 350 MG/ML SOLN COMPARISON:  05/12/2020 FINDINGS: Cardiovascular: Filling defects in the lower lobe pulmonary arteries in the right lower lobe compatible with pulmonary emboli. No evidence of pulmonary emboli on the left. Scattered aortic and coronary artery calcifications. Cardiomegaly. Aorta normal caliber. Mediastinum/Nodes: Mildly enlarged mediastinal lymph nodes. No hilar or axillary adenopathy. Mediastinal lymph nodes are stable since prior study. Lungs/Pleura: Small bilateral pleural effusions, slightly increased since prior study. Mild ground-glass opacities in the mid and lower lungs, likely mild edema. Vascular congestion. Right basilar compressive atelectasis. Upper Abdomen: Imaging into the upper abdomen demonstrates no acute findings. Musculoskeletal: Chest wall soft tissues are unremarkable. No acute bony abnormality. Review of the MIP images confirms the above findings. IMPRESSION: Filling defects within right lower lobe pulmonary arteries compatible with small pulmonary emboli. Cardiomegaly, vascular congestion and mild/early edema. Small bilateral pleural effusions, increasing since prior study. Mediastinal adenopathy, similar to prior study. Coronary artery disease. Aortic Atherosclerosis (ICD10-I70.0). Critical Value/emergent results were called by telephone at the time of interpretation on 06/07/2020 at 6:26 pm [facility]  verbally acknowledged these results. Electronically Signed   By: KRolm BaptiseM.D.   On: 06/07/2020 18:27    ____________________________________________   PROCEDURES  Procedure(s) performed (including Critical Care):  .Critical Care Performed by: SLucrezia Starch MD Authorized by: SLucrezia Starch MD   Critical care provider statement:    Critical care time (minutes):  45   Critical care time was exclusive of:  Separately billable procedures and treating other patients   Critical care was necessary to treat or prevent imminent or life-threatening deterioration of the following conditions:  Cardiac failure (pulmonary embolism, treated with heparin gtt started in ED )   Critical care was time spent personally by me on the following activities:  Discussions with consultants, evaluation of patient's response to treatment, examination of patient, ordering and performing treatments and interventions, ordering and review of laboratory studies, ordering and review of radiographic studies, pulse oximetry, re-evaluation of patient's condition, obtaining history from patient or surrogate and review of old charts .1-3 Lead EKG Interpretation Performed by: SLucrezia Starch MD Authorized by: SLucrezia Starch MD     Interpretation: normal     ECG rate assessment: normal     Rhythm: sinus rhythm     Ectopy: none     Conduction: normal       ____________________________________________   INITIAL IMPRESSION / ASSESSMENT AND PLAN / ED COURSE        Patient presents above to history exam for assessment of above-noted symptoms that have been worsening since the recent hospitalization.  Patient is slightly hypertensive with a BP of 140/101 with stable vital signs on room air.    Primary differential includes but is not limited to ACS, PE, acute heart failure exacerbation, COPD exacerbation,  pneumonia, metabolic derangements, anemia, myocarditis, pericarditis, and symptomatic effusions.  No  focal consolidations on chest x-ray or elevation of white blood cell count or fever to suggest pneumonia.  No evidence of pneumothorax on chest x-ray.  Patient does appear volume overloaded on exam and chest x-ray and his BNP is elevated at over 1000.  CBC is unremarkable and shows no evidence of significant anemia.  BMP shows evidence of hyperglycemia with a glucose of 222 but otherwise no significant electrolyte or metabolic derangements.  Specifically no evidence of acidosis or findings suggestive of DKA.  Initial troponin is elevated at 55 however this is compared to troponins obtained 3 weeks ago that were noted to trend up from 64-84 and then back down again to 73.  I will plan to obtain repeat troponin although elevated troponin today is likely reflective of some demand ischemia no low suspicion for acute coronary thrombosis at this time.  CTA chest does show evidence of volume overload as well as small pulmonary emboli.  No evidence of right heart strain.  Given concern for decompensated heart failure with elevated troponin and patient complaining of active chest pain Heparin started in the ED.  While wheezing is likely related to patient's heart failure he does have history of COPD and is somewhat difficult to exclude a COPD exacerbation given he also reports productive cough.  Will give a DuoNeb therapy although defer steroids as patient states he has severe hallucinations whenever he takes steroids.  I will plan to admit to hospitalist service for further evaluation management.        ____________________________________________   FINAL CLINICAL IMPRESSION(S) / ED DIAGNOSES  Final diagnoses:  Acute on chronic heart failure, unspecified heart failure type ([place])  Pulmonary embolism without acute cor pulmonale, unspecified chronicity, unspecified pulmonary embolism type (H[facility])  COPD exacerbation (H[facility])  Hyperglycemia  Hypertension, unspecified type  Troponin I above reference range    Elevated brain natriuretic peptide (BNP) level    Medications  insulin aspart (novoLOG) injection 0-15 Units (has no administration in time range)  furosemide (LASIX) injection 80 mg (has no administration in time range)  ipratropium-albuterol (DUONEB) 0.5-2.5 (3) MG/3ML nebulizer solution 3 mL (has no administration in time range)  rosuvastatin (CRESTOR) tablet 20 mg (has no administration in time range)  carvedilol (COREG) tablet 25 mg (has no administration in time range)  cloNIDine (CATAPRES) tablet 0.05 mg (has no administration in time range)  iohexol (OMNIPAQUE) 350 MG/ML injection 75 mL (75 mLs Intravenous Contrast Given 06/07/20 1800)     ED Discharge Orders    None       Note:  This document was prepared using Dragon voice recognition software and may include unintentional dictation errors.   Lucrezia Starch, MD 06/07/20 [phone]

## 2020-06-07 NOTE — H&P (Addendum)
History and Physical    Gabriel [name]. SWF:[id number] DOB: [birth year] DOA: 06/07/2020  PCP: Patient, No Pcp Per   Patient coming from: Home  I have personally briefly reviewed patient's old medical records in [place]  Chief Complaint: Shortness of breath  HPI: Gabriel Bonneau. is a 49 y.o. male with medical history significant for CAD, systolic CHF, HTN, DM, COPD, CKD stage III, history of PE noncompliant with Xarelto hospitalized from 9/24 to 9/26 with heart failure exacerbation admits to being noncompliant with medication for the past 2 months who was sent by the heart failure clinic to the emergency room for chest pain and shortness of breath.  Patient states that since his hospitalization a month ago he has continued to have shortness of breath with increasing weight gain and abdominal distention.  Lately he has been having increasing chest pressure, cough congestion and fatigue.  He denies fever.  Denies nausea vomiting diarrhea.ED Course: On arrival in the emergency room he was afebrile at 97.4, BP 143/92, pulse 76, O2 sat 95% on room air.  Troponin 55>>59.  BMP remarkable creatinine 1.27 Which is about his baseline, glucose of 222.  Hepatic function panel with a bilirubin 2.2 BNP elevated at 1566.  CTA chest showing filling defects within the right lower lobe pulmonary arteries compatible with small pulmonary edema. EKG as reviewed by me : Normal sinus rhythm with no acute ST-T wave changes Patient given IV Lasix, started on a heparin infusion.  Also treated with a DuoNeb.  Hospitalist consulted for admission.  Review of Systems: As per HPI otherwise all other systems on review of systems negative.    Past Medical History:  Diagnosis Date  . Chest wall pain, chronic   . Chronic systolic CHF (congestive heart failure) ([place])    a. 03/2015 Echo: EF 45-50%; b. 12/2015 Echo: EF 20-25%; c. 02/2016 Echo: EF 30-35%; d. 11/2016 Echo: EF 40-45%; e. 06/2019 Echo: EF 30-35%; f.  4/42021 Echo: EF 25-30%.  . Chronic Troponin Elevation   . CKD (chronic kidney disease), stage II-III   . COPD (chronic obstructive pulmonary disease) ([place])   . Diabetes mellitus without complication ([place])   . Hypertension    Resolved since weight loss  . NICM (nonischemic cardiomyopathy) ([place])    a. 03/2015 Echo: EF 45-50%; b. 12/2015 Echo: EF 20-25%; c. 02/2016 Echo: EF 30-35%; d. 11/2016 Echo: EF 40-45%; e. 06/2019 Echo: EF 30-35%; f. 11/2019 Echo: EF 25-30%, glob HK. Mild LVH. Gr2 DD. Mild-mod AI. BAE. Triv MR.  . Non-obstructive CAD (coronary artery disease)    a. 04/2015 low risk MV;  b. 12/2016 Cath: minor irregs in LAD/Diag/LCX/OM, RCA 40p/m/d.  [name] (nonsustained ventricular tachycardia) ([place])    a. 12/2015 noted on tele-->amio;  b. 12/2015 Event monitor: no VT. Atach noted.  . Obesity (BMI 30.0-34.9)   . Psoriasis   . Syncope    a. 01/2016 - felt to be vasovagal.  . Tobacco abuse     Past Surgical History:  Procedure Laterality Date  . AMPUTATION    . CARDIAC CATHETERIZATION    . FINGER AMPUTATION     Traumatic  . FINGER FRACTURE SURGERY Left   . LEFT HEART CATH AND CORONARY ANGIOGRAPHY N/A 01/06/2017   Procedure: Left Heart Cath and Coronary Angiography;  Surgeon: Wellington Hampshire, MD;  Location: [facility];  Service: Cardiovascular;  Laterality: N/A;     reports that he quit smoking about 4 weeks ago. His smoking  use included cigarettes. He has a 16.50 pack-year smoking history. He has quit using smokeless tobacco. He reports current alcohol use. He reports that he does not use drugs.  Allergies  Allergen Reactions  . Metformin And Related Nausea And Vomiting  . Prednisone Other (See Comments)    Reaction: Hallucinations    . Zantac [Ranitidine Hcl] Diarrhea and Nausea Only    Night sweats    Family History  Problem Relation Age of Onset  . Diabetes Mellitus II Mother   . Hypothyroidism Mother   . Hypertension Mother   . Hypertension Father   . Gout  Father   . Cancer Paternal Aunt   . Cancer Maternal Grandfather   . Diabetes Maternal Grandfather       Prior to Admission medications   Medication Sig Start Date End Date Taking? Authorizing Provider  apixaban (ELIQUIS) 5 MG TABS tablet Take 1 tablet (5 mg total) by mouth 2 (two) times daily. Patient not taking: Reported on 06/07/2020 01/05/20   Minna Merritts, MD  blood glucose meter kit and supplies KIT Dispense based on patient and insurance preference. Use up to four times daily as directed. (FOR ICD-9 250.00, 250.01). Patient not taking: Reported on 06/07/2020 03/02/20   Iloabachie, Chioma E, NP  carvedilol (COREG) 25 MG tablet Take 1 tablet (25 mg total) by mouth 2 (two) times daily with a meal. Patient not taking: Reported on 06/07/2020 03/08/20 05/12/20  Iloabachie, Chioma E, NP  cloNIDine (CATAPRES) 0.1 MG tablet Take 0.5 tablets (0.05 mg total) by mouth 2 (two) times daily. Takes 1/2 tablet twice daily Patient not taking: Reported on 06/07/2020 03/08/20 05/12/20  Iloabachie, Chioma E, NP  fenofibrate (TRICOR) 145 MG tablet Take 1 tablet (145 mg total) by mouth daily. Patient not taking: Reported on 06/07/2020 01/21/20   Larey Dresser, MD  Insulin Glargine [facility]) 100 UNIT/ML Inject 0.28 mLs (28 Units total) into the skin at bedtime. Patient not taking: Reported on 06/07/2020 03/16/20   Iloabachie, Chioma E, NP  insulin lispro (HUMALOG) 100 UNIT/ML KwikPen Inject 0.03 mLs (3 Units total) into the skin 3 (three) times daily before meals. Patient not taking: Reported on 06/07/2020 03/16/20   Iloabachie, Chioma E, NP  Insulin Pen Needle 29G X 5MM MISC 1 Dose by Does not apply route 4 (four) times daily -  with meals and at bedtime. Patient not taking: Reported on 06/07/2020 01/02/20   Loletha Grayer, MD  Ipratropium-Albuterol (COMBIVENT) 20-100 MCG/ACT AERS respimat Inhale 1 puff into the lungs every 6 (six) hours. Patient not taking: Reported on 05/12/2020 01/02/20   Loletha Grayer, MD  lidocaine (LIDODERM) 5 % Place 1 patch onto the skin daily. Remove & Discard patch within 12 hours or as directed by MD Patient not taking: Reported on 06/07/2020 05/14/20   Sharen Hones, MD  nitroGLYCERIN (NITROSTAT) 0.4 MG SL tablet Place 1 tablet (0.4 mg total) under the tongue every 5 (five) minutes x 3 doses as needed for chest pain. Patient not taking: Reported on 06/07/2020 06/27/19   [name], MD  rosuvastatin (CRESTOR) 20 MG tablet Take 1 tablet (20 mg total) by mouth at bedtime. Patient not taking: Reported on 06/07/2020 01/21/20   Larey Dresser, MD  sacubitril-valsartan (ENTRESTO) 49-51 MG Take 1 tablet by mouth 2 (two) times daily. Patient not taking: Reported on 05/12/2020 03/07/20   Larey Dresser, MD  spironolactone (ALDACTONE) 25 MG tablet Take 1 tablet (25 mg total) by mouth 2 (two) times daily.  Patient not taking: Reported on 06/07/2020 03/08/20 05/12/20  Iloabachie, Chioma E, NP  torsemide (DEMADEX) 20 MG tablet Take 2 tablets (40 mg total) by mouth 2 (two) times daily. Patient not taking: Reported on 06/07/2020 03/08/20   Langston Reusing, NP    Physical Exam: Vitals:   06/07/20 1141 06/07/20 1142 06/07/20 1627  BP: (!) 143/92  (!) 148/101  Pulse: 76  72  Resp: 18  18  Temp: (!) 97.4 F (36.3 C)  98.1 F (36.7 C)  TempSrc: Oral  Oral  SpO2: 95%  99%  Weight:  118.8 kg   Height:  6' (1.829 m)      Vitals:   06/07/20 1141 06/07/20 1142 06/07/20 1627  BP: (!) 143/92  (!) 148/101  Pulse: 76  72  Resp: 18  18  Temp: (!) 97.4 F (36.3 C)  98.1 F (36.7 C)  TempSrc: Oral  Oral  SpO2: 95%  99%  Weight:  118.8 kg   Height:  6' (1.829 m)       Constitutional:  Appears unwell, tachypneic, speaking in short sentences HEENT:      Head: Normocephalic and atraumatic.         Eyes: PERLA, EOMI, Conjunctivae are normal. Sclera is non-icteric.       Mouth/Throat: Mucous membranes are moist.       Neck: Supple with no signs of  meningismus. Cardiovascular: Regular rate and rhythm. No murmurs, gallops, or rubs. 2+ symmetrical distal pulses are present . No JVD. No LE edema Respiratory: Respiratory effort increased with bibasilar rales Gastrointestinal: Soft, non tender, and non distended with positive bowel sounds. No rebound or guarding. Genitourinary: No CVA tenderness. Musculoskeletal: Nontender with normal range of motion in all extremities. No cyanosis, or erythema of extremities. Neurologic:  Face is symmetric. Moving all extremities. No gross focal neurologic deficits . Skin: Skin is warm, dry.  No rash or ulcers Psychiatric: Depressed affect   Labs on Admission: I have personally reviewed following labs and imaging studies  CBC: Recent Labs  Lab 06/07/20 1144  WBC 9.4  HGB 15.3  HCT 46.1  MCV 84.7  PLT 962   Basic Metabolic Panel: Recent Labs  Lab 06/07/20 1144 06/07/20 1831  NA 137  --   K 4.3  --   CL 105  --   CO2 24  --   GLUCOSE 222*  --   BUN 21*  --   CREATININE 1.27*  --   CALCIUM 8.5*  --   MG  --  1.8   GFR: Estimated Creatinine Clearance: 91.6 mL/min (A) (by C-G formula based on SCr of 1.27 mg/dL (H)). Liver Function Tests: Recent Labs  Lab 06/07/20 1831  AST 25  ALT 32  ALKPHOS 46  BILITOT 2.2*  PROT 6.3*  ALBUMIN 3.2*   Recent Labs  Lab 06/07/20 1831  LIPASE 26   No results for input(s): AMMONIA in the last 168 hours. Coagulation Profile: Recent Labs  Lab 06/07/20 1144  INR 1.1   Cardiac Enzymes: No results for input(s): CKTOTAL, CKMB, CKMBINDEX, TROPONINI in the last 168 hours. BNP (last 3 results) No results for input(s): PROBNP in the last 8760 hours. HbA1C: No results for input(s): HGBA1C in the last 72 hours. CBG: Recent Labs  Lab 06/07/20 1106  GLUCAP 224*   Lipid Profile: No results for input(s): CHOL, HDL, LDLCALC, TRIG, CHOLHDL, LDLDIRECT in the last 72 hours. Thyroid Function Tests: No results for input(s): TSH, T4TOTAL, FREET4,  T3FREE, THYROIDAB  in the last 72 hours. Anemia Panel: No results for input(s): VITAMINB12, FOLATE, FERRITIN, TIBC, IRON, RETICCTPCT in the last 72 hours. Urine analysis:    Component Value Date/Time   COLORURINE STRAW (A) 01/09/2018 1659   APPEARANCEUR Clear 03/08/2020 0951   LABSPEC 1.026 01/09/2018 1659   LABSPEC 1.019 11/12/2012 1713   PHURINE 6.0 01/09/2018 1659   GLUCOSEU 3+ (A) 03/08/2020 0951   GLUCOSEU Negative 11/12/2012 1713   HGBUR SMALL (A) 01/09/2018 1659   BILIRUBINUR Negative 03/08/2020 0951   BILIRUBINUR Negative 11/12/2012 1713   KETONESUR 5 (A) 01/09/2018 1659   PROTEINUR Negative 03/08/2020 0951   PROTEINUR 100 (A) 01/09/2018 1659   NITRITE Negative 03/08/2020 0951   NITRITE NEGATIVE 01/09/2018 1659   LEUKOCYTESUR Negative 03/08/2020 0951   LEUKOCYTESUR Negative 11/12/2012 1713    Radiological Exams on Admission: DG Chest 2 View  Result Date: 06/07/2020 CLINICAL DATA:  Chest pain and shortness of breath EXAM: CHEST - 2 VIEW COMPARISON:  Chest radiograph and chest CT May 12, 2020 FINDINGS: There is cardiomegaly with pulmonary vascular congestion. There are small joint effusions bilaterally with a degree of interstitial pulmonary edema. There is mild left midlung atelectasis. No consolidation. No adenopathy. No bone lesions. No pneumothorax. IMPRESSION: Cardiomegaly with pulmonary vascular congestion. There is a degree of interstitial pulmonary edema with small pleural effusions. The overall appearance is felt to be indicative of a degree of congestive heart failure. No consolidation. Mild atelectasis left mid lung noted. Electronically Signed   By: Lowella Grip III M.D.   On: 06/07/2020 12:13   CT Angio Chest PE W and/or Wo Contrast  Result Date: 06/07/2020 CLINICAL DATA:  Chest pain, shortness of breath EXAM: CT ANGIOGRAPHY CHEST WITH CONTRAST TECHNIQUE: Multidetector CT imaging of the chest was performed using the standard protocol during bolus  administration of intravenous contrast. Multiplanar CT image reconstructions and MIPs were obtained to evaluate the vascular anatomy. CONTRAST:  57m OMNIPAQUE IOHEXOL 350 MG/ML SOLN COMPARISON:  05/12/2020 FINDINGS: Cardiovascular: Filling defects in the lower lobe pulmonary arteries in the right lower lobe compatible with pulmonary emboli. No evidence of pulmonary emboli on the left. Scattered aortic and coronary artery calcifications. Cardiomegaly. Aorta normal caliber. Mediastinum/Nodes: Mildly enlarged mediastinal lymph nodes. No hilar or axillary adenopathy. Mediastinal lymph nodes are stable since prior study. Lungs/Pleura: Small bilateral pleural effusions, slightly increased since prior study. Mild ground-glass opacities in the mid and lower lungs, likely mild edema. Vascular congestion. Right basilar compressive atelectasis. Upper Abdomen: Imaging into the upper abdomen demonstrates no acute findings. Musculoskeletal: Chest wall soft tissues are unremarkable. No acute bony abnormality. Review of the MIP images confirms the above findings. IMPRESSION: Filling defects within right lower lobe pulmonary arteries compatible with small pulmonary emboli. Cardiomegaly, vascular congestion and mild/early edema. Small bilateral pleural effusions, increasing since prior study. Mediastinal adenopathy, similar to prior study. Coronary artery disease. Aortic Atherosclerosis (ICD10-I70.0). Critical Value/emergent results were called by telephone at the time of interpretation on 06/07/2020 at 6:26 pm to provider [facility], who verbally acknowledged these results. Electronically Signed   By: KRolm BaptiseM.D.   On: 06/07/2020 18:27     Assessment/Plan 49year old male with history CAD, systolic CHF, HTN, DM, COPD, CKD stage III, history of PE noncompliant with Xarelto hospitalized from 9/24 to 9/26 with heart failure exacerbation admits to being noncompliant with medication for the past 2 months who was sent by  the heart failure clinic to the emergency room for chest pain and shortness of breath.  Acute on chronic systolic CHF (congestive heart failure) ([place]) -Patient with history of medication noncompliance and recent hospitalization a month ago for CHF presenting similarly with shortness of breath, orthopnea and lower extremity edema, BNP elevated above 1500, with chest x-ray showing cardiomegaly with pulmonary vascular congestion -IV Lasix, Coreg.  Holding ACE/ARB versus Entresto given history of noncompliance conversation on evaluation still medication compliance s such as cost -Daily weights, monitoring and salt and fluid restriction -Follow-up echocardiogram.  Last EF April 2021 with EF 25 to 30% -Continue to cycle troponins to evaluate for ACS as possible trigger    Acute pulmonary embolism (HCC) CAD, systolic CHF, HTN, DM, COPD, CKD stage III, history of PE noncompliant with Xarelto hospitalized from 9/24 to 9/26 with heart failure exacerbation admits to being noncompliant with medication for the past 2 months who was sent by the heart failure clinic to the emergency room for chest pain and shortness of breath. -Patient with history of PE, noncompliant with Xarelto -CTA chest showing small PEs with small clot burden and no right ventricular strain -Continue heparin infusion    Elevated troponin Coronary artery disease, non-occlusive -Suspect related to demand ischemia -During patient's recent hospitalization troponin was elevated to the 80s   NICM (nonischemic cardiomyopathy) ([place])      COPD (chronic obstructive pulmonary disease) ([place]) -Possible mild exacerbation -DuoNeb as needed    Noncompliance with medications -Case consult -Patient unable to afford medications.  Has no insurance.  -Patient may needs depression screen    Uncontrolled type 2 diabetes mellitus with hyperglycemia, with long-term current use of insulin (HCC) -Sliding scale insulin coverage    DVT prophylaxis:  Full dose heparin Code Status: full code  Family Communication:  none  Disposition Plan: Back to previous home environment Consults called: none  Status:At the time of admission, it appears that the appropriate admission status for this patient is INPATIENT. This is judged to be reasonable and necessary in order to provide the required intensity of service to ensure the patient's safety given the presenting symptoms, physical exam findings, and initial radiographic and laboratory data in the context of their  Comorbid conditions.   Patient requires inpatient status due to high intensity of service, high risk for further deterioration and high frequency of surveillance required.   I certify that at the point of admission it is my clinical judgment that the patient will require inpatient hospital care spanning beyond [place] MD [facility]     06/07/2020, 7:30 PM

## 2020-06-07 NOTE — Patient Instructions (Signed)
Continue weighing daily and call for an overnight weight gain of > 2 pounds or a weekly weight gain of >5 pounds. 

## 2020-06-08 ENCOUNTER — Inpatient Hospital Stay (HOSPITAL_COMMUNITY)
Admit: 2020-06-08 | Discharge: 2020-06-08 | Disposition: A | Discharge status: Home / Self Care | Payer: Medicaid Other | Attending: Internal Medicine | Admitting: Internal Medicine

## 2020-06-08 ENCOUNTER — Encounter: Payer: Self-pay | Admitting: Internal Medicine

## 2020-06-08 DIAGNOSIS — I5021 Acute systolic (congestive) heart failure: Secondary | ICD-10-CM | POA: Diagnosis not present

## 2020-06-08 DIAGNOSIS — I509 Heart failure, unspecified: Secondary | ICD-10-CM

## 2020-06-08 DIAGNOSIS — J441 Chronic obstructive pulmonary disease with (acute) exacerbation: Secondary | ICD-10-CM | POA: Diagnosis not present

## 2020-06-08 DIAGNOSIS — R739 Hyperglycemia, unspecified: Secondary | ICD-10-CM | POA: Insufficient documentation

## 2020-06-08 DIAGNOSIS — R7989 Other specified abnormal findings of blood chemistry: Secondary | ICD-10-CM | POA: Insufficient documentation

## 2020-06-08 DIAGNOSIS — I2699 Other pulmonary embolism without acute cor pulmonale: Secondary | ICD-10-CM

## 2020-06-08 DIAGNOSIS — I5023 Acute on chronic systolic (congestive) heart failure: Secondary | ICD-10-CM

## 2020-06-08 DIAGNOSIS — I10 Essential (primary) hypertension: Secondary | ICD-10-CM

## 2020-06-08 DIAGNOSIS — R778 Other specified abnormalities of plasma proteins: Secondary | ICD-10-CM

## 2020-06-08 LAB — CBC
HCT: 44.2 % (ref 39.0–52.0)
Hemoglobin: 14.6 g/dL (ref 13.0–17.0)
MCH: 28.3 pg (ref 26.0–34.0)
MCHC: 33 g/dL (ref 30.0–36.0)
MCV: 85.8 fL (ref 80.0–100.0)
Platelets: 186 10*3/uL (ref 150–400)
RBC: 5.15 MIL/uL (ref 4.22–5.81)
RDW: 14 % (ref 11.5–15.5)
WBC: 8.5 10*3/uL (ref 4.0–10.5)
nRBC: 0 % (ref 0.0–0.2)

## 2020-06-08 LAB — GLUCOSE, CAPILLARY
Glucose-Capillary: 177 mg/dL — ABNORMAL HIGH (ref 70–99)
Glucose-Capillary: 173 mg/dL — ABNORMAL HIGH (ref 70–99)
Glucose-Capillary: 221 mg/dL — ABNORMAL HIGH (ref 70–99)
Glucose-Capillary: 175 mg/dL — ABNORMAL HIGH (ref 70–99)
Glucose-Capillary: 196 mg/dL — ABNORMAL HIGH (ref 70–99)

## 2020-06-08 LAB — BASIC METABOLIC PANEL
Anion gap: 10 (ref 5–15)
BUN: 24 mg/dL — ABNORMAL HIGH (ref 6–20)
CO2: 24 mmol/L (ref 22–32)
Calcium: 8.7 mg/dL — ABNORMAL LOW (ref 8.9–10.3)
Chloride: 103 mmol/L (ref 98–111)
Creatinine, Ser: 1.26 mg/dL — ABNORMAL HIGH (ref 0.61–1.24)
GFR, Estimated: >60 mL/min (ref >60)
Glucose, Bld: 203 mg/dL — ABNORMAL HIGH (ref 70–99)
Potassium: 4 mmol/L (ref 3.5–5.1)
Sodium: 137 mmol/L (ref 135–145)

## 2020-06-08 LAB — ECHOCARDIOGRAM COMPLETE
AR max vel: 3.74 cm2
AV Area VTI: 3.86 cm2
AV Area mean vel: 3.69 cm2
AV Mean grad: 3 mmHg
AV Peak grad: 6.1 mmHg
Ao pk vel: 1.23 m/s
Area-P 1/2: 5.09 cm2
Calc EF: 50.6 %
Height: 72 in
S' Lateral: 5.35 cm
Single Plane A2C EF: 48.2 %
Single Plane A4C EF: 53.9 %
Weight: 4192 oz

## 2020-06-08 LAB — HEPARIN LEVEL (UNFRACTIONATED)
Heparin Unfractionated: 0.21 IU/mL — ABNORMAL LOW (ref 0.30–0.70)
Heparin Unfractionated: 0.37 IU/mL (ref 0.30–0.70)

## 2020-06-08 LAB — TROPONIN I (HIGH SENSITIVITY): Troponin I (High Sensitivity): 46 ng/L — ABNORMAL HIGH (ref <18)

## 2020-06-08 MED ORDER — HEPARIN (PORCINE) 25000 UT/250ML-% IV SOLN: 1950.0000 [IU]/h | INTRAVENOUS | Status: AC

## 2020-06-08 MED ORDER — PERFLUTREN LIPID MICROSPHERE
1.0000 mL | INTRAVENOUS | Status: AC | PRN
  Administered 2020-06-08: 2 mL via INTRAVENOUS
  Filled 2020-06-08: qty 10

## 2020-06-08 MED ORDER — MORPHINE SULFATE (PF) 2 MG/ML IV SOLN
2.0000 mg | Freq: Once | INTRAVENOUS | Status: AC
  Administered 2020-06-08: 2 mg via INTRAVENOUS
  Filled 2020-06-08: qty 1

## 2020-06-08 MED ORDER — RIVAROXABAN 20 MG PO TABS: 20.0000 mg | ORAL_TABLET | Freq: Every day | ORAL | Status: DC

## 2020-06-08 MED ORDER — INSULIN DETEMIR 100 UNIT/ML ~~LOC~~ SOLN
10.0000 [IU] | Freq: Every day | SUBCUTANEOUS | Status: DC
  Administered 2020-06-08: 10 [IU] via SUBCUTANEOUS
  Filled 2020-06-08 (×2): qty 0.1

## 2020-06-08 MED ORDER — RIVAROXABAN 15 MG PO TABS
15.0000 mg | ORAL_TABLET | Freq: Two times a day (BID) | ORAL | Status: DC
  Administered 2020-06-08 – 2020-06-09 (×2): 15 mg via ORAL
  Filled 2020-06-08 (×4): qty 1

## 2020-06-08 MED ORDER — IPRATROPIUM-ALBUTEROL 0.5-2.5 (3) MG/3ML IN SOLN
3.0000 mL | Freq: Four times a day (QID) | RESPIRATORY_TRACT | Status: DC
  Administered 2020-06-08 – 2020-06-09 (×6): 3 mL via RESPIRATORY_TRACT
  Filled 2020-06-08 (×6): qty 3

## 2020-06-08 MED ORDER — HEPARIN BOLUS VIA INFUSION
1500.0000 [IU] | Freq: Once | INTRAVENOUS | Status: AC
  Administered 2020-06-08: 1500 [IU] via INTRAVENOUS
  Filled 2020-06-08: qty 1500

## 2020-06-08 MED ORDER — KETOROLAC TROMETHAMINE 30 MG/ML IJ SOLN
30.0000 mg | Freq: Once | INTRAMUSCULAR | Status: AC
  Administered 2020-06-08: 30 mg via INTRAVENOUS
  Filled 2020-06-08: qty 1

## 2020-06-08 MED ORDER — ALBUTEROL SULFATE (2.5 MG/3ML) 0.083% IN NEBU: 2.5000 mg | INHALATION_SOLUTION | RESPIRATORY_TRACT | Status: DC | PRN

## 2020-06-08 NOTE — Progress Notes (Addendum)
[place] for Xarelto Indication: pulmonary embolus  Allergies  Allergen Reactions  . Metformin And Related Nausea And Vomiting  . Prednisone Other (See Comments)    Reaction: Hallucinations    . Zantac [Ranitidine Hcl] Diarrhea and Nausea Only    Night sweats    Patient Measurements: Height: 6' (182.9 cm) Weight: 118.8 kg (262 lb) IBW/kg (Calculated) : 77.6 Heparin Dosing Weight: 103.6 kg  Vital Signs: BP: 138/89 (10/21 0806) Pulse Rate: 67 (10/21 0806)  Labs: Recent Labs    06/07/20 1144 06/07/20 1155 06/07/20 1714 06/08/20 0120 06/08/20 0420 06/08/20 0857 06/08/20 0906  HGB 15.3  --   --   --  14.6  --   --   HCT 46.1  --   --   --  44.2  --   --   PLT 192  --   --   --  186  --   --   LABPROT 13.3  --   --   --   --   --   --   INR 1.1  --   --   --   --   --   --   HEPARINUNFRC  --   --   --  0.21*  --   --  0.37  CREATININE 1.27*  --   --   --  1.26*  --   --   TROPONINIHS  --  55* 59*  --   --  46*  --     Estimated Creatinine Clearance: 92.3 mL/min (A) (by C-G formula based on SCr of 1.26 mg/dL (H)).   Medical History: Past Medical History:  Diagnosis Date  . Chest wall pain, chronic   . Chronic systolic CHF (congestive heart failure) ([place])    a. 03/2015 Echo: EF 45-50%; b. 12/2015 Echo: EF 20-25%; c. 02/2016 Echo: EF 30-35%; d. 11/2016 Echo: EF 40-45%; e. 06/2019 Echo: EF 30-35%; f. 4/42021 Echo: EF 25-30%.  . Chronic Troponin Elevation   . CKD (chronic kidney disease), stage II-III   . COPD (chronic obstructive pulmonary disease) ([place])   . Diabetes mellitus without complication ([place])   . Hypertension    Resolved since weight loss  . NICM (nonischemic cardiomyopathy) ([place])    a. 03/2015 Echo: EF 45-50%; b. 12/2015 Echo: EF 20-25%; c. 02/2016 Echo: EF 30-35%; d. 11/2016 Echo: EF 40-45%; e. 06/2019 Echo: EF 30-35%; f. 11/2019 Echo: EF 25-30%, glob HK. Mild LVH. Gr2 DD. Mild-mod AI. BAE. Triv MR.  . Non-obstructive CAD  (coronary artery disease)    a. 04/2015 low risk MV;  b. 12/2016 Cath: minor irregs in LAD/Diag/LCX/OM, RCA 40p/m/d.  [name] (nonsustained ventricular tachycardia) ([place])    a. 12/2015 noted on tele-->amio;  b. 12/2015 Event monitor: no VT. Atach noted.  . Obesity (BMI 30.0-34.9)   . Psoriasis   . Syncope    a. 01/2016 - felt to be vasovagal.  . Tobacco abuse     Medications:  Pharmacy technician confirmed that patient has not taken Apixaban 5mg  BID in 2 months.   Assessment: 49yo male with PMH HTN, diabetes, COPD, CKD, asthma, chronic heart failure, previous tobacco use, and PE (has not taken Apixaban in 2 months). Patient presented to ED after being referred by the heart failure clinic for admission due to concerns for decompensated heart failure. Patient states that since he was last hospitalized approximately a month ago he has had worsening SOB associated with some substernal chest pressure, cough,  weight gain, abdominal distention. Patient states he has chest pressure and SOB all the time which are both exacerbated by exertion. HS troponin 55>59. 10/20 CTA chest shows evidence of volume overload as well as small pulmonary emboli. Patient was previously on heparin infusion 10/20-10/21 for PE and pharmacy has been consulted to switch to Xarelto. Baseline labs WNL and have remained stable.  Goal of Therapy:  Monitor platelets by anticoagulation protocol: Yes   Plan:  Discontinue heparin infusion when rivaroxaban is scheduled to start (10/21 @1700 ) Start rivaroxaban 15mg  BID x 21 days followed by rivaroxaban 20mg  daily thereafter Monitor CBC per protocol  Sherilyn Banker, PharmD Pharmacy Resident  06/08/2020 2:10 PM

## 2020-06-08 NOTE — Progress Notes (Signed)
*  PRELIMINARY RESULTS* Echocardiogram 2D Echocardiogram has been performed.  Gabriel [name] 06/08/2020, 10:05 AM

## 2020-06-08 NOTE — TOC Initial Note (Signed)
Transition of Care (TOC) - Initial/Assessment Note    Patient Details  Name: Gabriel [name]. MRN: [id number] Date of Birth: Jul 03, 1971  Transition of Care [facility]) CM/SW Contact:    Ova Freshwater Phone Number: [phone] 06/08/2020, 2:04 PM  Clinical Narrative:                 Patient  Presents to [facility] ED due to SOB and fluid gain.  Patient lives at home with his father Ernst Cumpston [phone].  Patient states he is able to perform all ADLs and is able to drive himself to doctors appointments, grocery store, work and to pharmacy. Patient stated his father cooks but he cleans. Patient stated he does use narcotics and only drinks alcohol occasionally.  Patient stated he will not go to a SNF and and will consider home health.     Expected Discharge Plan: Skilled Nursing Facility Barriers to Discharge: Continued Medical Work up   Patient Goals and CMS Choice Patient states their goals for this hospitalization and ongoing recovery are:: "I don't want a nursing home, I want to go home with home health and do for myself as long as I can." CMS Medicare.gov Compare Post Acute Care list provided to:: Patient Choice offered to / list presented to : Patient  Expected Discharge Plan and Services Expected Discharge Plan: [place] In-house Referral: Clinical Social Work   Post Acute Care Choice: [place] arrangements for the past 2 months: [place]                                      Prior Living Arrangements/Services Living arrangements for the past 2 months: Single Family Home Lives with:: Parents [name], [name] [phone]) Patient language and need for interpreter reviewed:: Yes Do you feel safe going back to the place where you live?: Yes      Need for Family Participation in Patient Care: No (Comment) Care giver support system in place?: No (comment)   Criminal Activity/Legal Involvement Pertinent to Current  Situation/Hospitalization: No - Comment as needed  Activities of Daily Living      Permission Sought/Granted Permission sought to share information with : Family Supports Permission granted to share information with : Yes, Verbal Permission Granted  Share Information with NAME: [name], [name] [phone]           Emotional Assessment Appearance:: Appears stated age Attitude/Demeanor/Rapport: Lethargic Affect (typically observed): Calm Orientation: : Oriented to Self, Oriented to Place, Oriented to  Time, Oriented to Situation Alcohol / Substance Use: Not Applicable Psych Involvement: No (comment)  Admission diagnosis:  Acute CHF (congestive heart failure) ([place]) [I50.9] Patient Active Problem List   Diagnosis Date Noted  . Uncontrolled type 2 diabetes mellitus with hyperglycemia, with long-term current use of insulin ([place]) 05/13/2020  . Acute on chronic systolic (congestive) heart failure ([place]) 05/12/2020  . Erectile dysfunction due to arterial insufficiency 03/15/2020  . Screening PSA (prostate specific antigen) 03/15/2020  . Erectile dysfunction 03/02/2020  . Acute pulmonary embolism ([place]) 01/03/2020  . Multiple subsegmental pulmonary emboli without acute cor pulmonale ([place])   . 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 Hemoptysis   . Costochondritis   . Acute respiratory failure with hypoxia ([place])   . Acute on chronic combined systolic (congestive) and diastolic (congestive) heart failure ([place]) 12/20/2019  . CHF exacerbation ([place]) 11/22/2019  . CKD stage 3 due to type  2 diabetes mellitus ([place]) 06/26/2019  . Type 2 diabetes mellitus with hyperlipidemia ([place]) 06/26/2019  . Hypertensive urgency 06/26/2019  . Acute on chronic combined systolic and diastolic CHF (congestive heart failure) ([place]) 09/02/2017  . Other chest pain 08/24/2017  . Dizziness   . Noncompliance with medications 04/29/2017  . Back pain 03/06/2017  . Tobacco use 12/04/2016  . Gallbladder sludge  12/04/2016  . Acute on chronic systolic CHF (congestive heart failure) ([place]) 11/28/2016  . Coronary artery disease, non-occlusive 03/15/2016  . Atypical chest pain 02/16/2016  . Orthostatic hypotension 01/23/2016  . Hypomagnesemia 01/23/2016  . Chronic systolic CHF (congestive heart failure) ([place]) 01/23/2016  . NICM (nonischemic cardiomyopathy) ([place]) 01/17/2016  . NSVT (nonsustained ventricular tachycardia) ([place])   . Elevated troponin   . Hyperlipidemia 05/17/2015  . COPD (chronic obstructive pulmonary disease) ([place]) 05/17/2015  . Essential hypertension 03/31/2015   PCP:  Patient, No Pcp Per Pharmacy:   [id number] - [place], [place] - [address] [place] [address] Phone: [phone] Fax: [phone]     Social Determinants of Health (SDOH) Interventions    Readmission Risk Interventions Readmission Risk Prevention Plan 05/14/2020 12/21/2019  Transportation Screening Complete Complete  PCP or Specialist Appt within 3-5 Days Complete Complete  HRI or [place] Complete Complete  Social Work Consult for [place] Planning/Counseling - Complete  Palliative Care Screening Not Applicable Not Applicable  Medication Review Press photographer) Complete Complete  Some recent data might be hidden

## 2020-06-08 NOTE — Progress Notes (Signed)
Heart Failure Nurse Navigator Note  HFrEF 25-30% by echocardiogram April of 2021, also noted was mild LVH and moderately elevated pulmonary artery pressures.   Co morbidities:  Hypertension Diabetes COPD CKD stage 2 Obesity BMI 35.5   History of pulmonary embolism non compliant with Xarelto  Medications:  Was not taking:  Eliquis 5 mg BID Coreg 25 mg BID Clonidine 0.1mg  BID Crestor 20 mg daily\ Entresto 49/51 mg BID Aldactone 25 mg daily Torsemide 20 mg BID Tricor   Labs:  Sodium 137, potassium 4, BUN 24, creatinine 1.26, troponin 46, albumin 3.2, Hgb A1c and CBC pending.   Assessment:  General - is awake and alert lying on gurney in the ED.  HEENT- pupils are equal. No JVD.  Cardiac- heart tones of regular rate and rhythm. No rubs or gallops  Chest- breath sounds are clear.  GI- abdomen is soft but distended.  Musculoskeletal- no lower extremity edema noted.  Psych-is apropriate.  Neuro- speech is clear, moves all extremities without difficulty.    Discussed with him his home cares for heart failure, he had not been taking his medications.  Talked about him notifying Otila Kluver NP in the heart failure clinic when there are problems, also discussed being more compliant with the medications and daily weight. Instructed on 2-3 pound weight gain over night or 5 pounds within the week.  He had also noted early satiety, along with abdominal enlargement.  During the conversation he also complained of it feeling like bricks where sitting on his chest, had been going on for about one hour, had a breathing treatment but did not help, asked him to take a deep breath which worsened the pressure feeling, could also reproduce with palpation.  His nurse was made aware.  Given heart failure teaching booklet and zone magnet.  Will continue to follow.  Pricilla Riffle RN, CHFN

## 2020-06-08 NOTE — ED Notes (Addendum)
Admitting MD Claudean Kinds about pt pain at this time.

## 2020-06-08 NOTE — ED Notes (Signed)
Pt continuously dropping oxygen sat while sleeping. O2 monitor checked and is placed correctly. MD made aware for Cpap need.

## 2020-06-08 NOTE — Progress Notes (Addendum)
PROGRESS NOTE    Gabriel [name].  BTD:[id number] DOB: [birth year] DOA: 06/07/2020 PCP: Patient, No Pcp Per   Brief Narrative:  Render Gabriel [name]. is a 49 y.o. male with medical history significant for CAD, systolic CHF, HTN, DM, COPD, CKD stage III, history of PE noncompliant with Xarelto hospitalized from 9/24 to 9/26 with heart failure exacerbation admits to being noncompliant with medication for the past 2 months who was sent by the heart failure clinic to the emergency room for chest pain and shortness of breath.  Patient states that since his hospitalization a month ago he has continued to have shortness of breath with increasing weight gain and abdominal distention.  Lately he has been having increasing chest pressure, cough congestion and fatigue.  Found to have elevated BNP, T bili and creatinine.  Borderline elevation of troponin with a flat curve.  CTA with filling defects within the right lower lobe pulmonary arteries compatible with small PEs.  Patient was started on heparin infusion and IV Lasix.  Subjective: Patient was complaining of some chest pain earlier today, refused nitro stating that it caused headaches.  Nonradiating, pressure-like pain.  Reproducible.  Resolved with morphine.  EKG without any changes.  He was seen and examined during morning rounds.  Chest pain which occurred earlier was resolved with morphine.  Later in the day there was some recurrence of chest pain.  No associated shortness of breath, nausea or vomiting.  Per patient he was having some financial issues regarding getting his medications.  Now he has Medicaid and should be able to take medications regularly.  He wants to send prescription to his regular pharmacy so he can get them easily.   Assessment & Plan:   Active Problems:   COPD (chronic obstructive pulmonary disease) (HCC)   Elevated troponin   NICM (nonischemic cardiomyopathy) (HCC)   Coronary artery disease, non-occlusive   Acute on  chronic systolic CHF (congestive heart failure) ([place])   Noncompliance with medications   Acute pulmonary embolism ([place])   Uncontrolled type 2 diabetes mellitus with hyperglycemia, with long-term current use of insulin (HCC)  Acute on chronic HFrEF.  Patient has EF of 25 to 30% according to the echocardiogram done in April 2021.  Appears volume up, as he was not taking his medications. -Continue with IV Lasix. -Should be restarted on Entresto once renal function improved. -Daily BMP and weight. -Strict intake and output. -Ambulatory referral to heart failure clinic on discharge.  Chest pain.  Patient is having nonspecific chest pain.  EKG without any acute changes and troponin with downward trend.  Most likely musculoskeletal as it is reproducible.  PE can also cause some chest pain and shortness of breath. -Supportive care.  Pulmonary embolism.  Patient has a prior history of PE with some small filling defects on CTA done on admission.  Patient was noncompliant with his Xarelto.  He was started on heparin infusion. -Switch him with Xarelto and discontinue heparin infusion. -We will ask TOC to help with medications.  Elevated troponin.  Patient has an history of nonischemic cardiomyopathy.  Troponin with downward trend and borderline elevation is more consistent with demand ischemia secondary to acute on chronic heart failure.  COPD.  No wheezing today.  There was some element of COPD exacerbation noted on admission note. -Continue with DuoNeb.  Hypertension.  Blood pressure within goal today. -Holding home dose of Entresto which can be restarted if blood pressure started going up.  Uncontrolled type 2 diabetes mellitus  with hyperglycemia, with long-term current use of insulin ([place]).  A1c in September was 8.2.  CBG elevated. -Add 10 units of Levemir at bedtime. -Continue with sliding scale  Medication noncompliance.  TOC was consulted. Patient is requesting prescription sent to regular  pharmacy on discharge as he has Medicaid now.  Objective: Vitals:   06/08/20 0425 06/08/20 0725 06/08/20 0806 06/08/20 0912  BP: 122/83 (!) 128/95 138/89   Pulse: 62 67 67   Resp: 14 20 20    Temp:      TempSrc:      SpO2: 97% 98% 98% 99%  Weight:      Height:        Intake/Output Summary (Last 24 hours) at 06/08/2020 1354 Last data filed at 06/08/2020 1144 Gross per 24 hour  Intake 236 ml  Output 3625 ml  Net -3389 ml   Filed Weights   06/07/20 1142  Weight: 118.8 kg    Examination:  General exam: Appears calm and comfortable  Respiratory system: Clear to auscultation. Respiratory effort normal. Cardiovascular system: S1 & S2 heard, RRR.  Gastrointestinal system: Soft, nontender, nondistended, bowel sounds positive. Central nervous system: Alert and oriented. No focal neurological deficits.Symmetric 5 x 5 power. Extremities: Trace LE edema, no cyanosis, pulses intact and symmetrical. Psychiatry: Judgement and insight appear normal. Mood & affect appropriate.    DVT prophylaxis: Xarelto Code Status: Full Family Communication: Discussed with patient Disposition Plan:  Status is: Inpatient  Remains inpatient appropriate because:Inpatient level of care appropriate due to severity of illness   Dispo: The patient is from: Home              Anticipated d/c is to: Home              Anticipated d/c date is: 2 days              Patient currently is not medically stable to d/c.  Patient will need 1-2 more days of IV diuresis.   Consultants:   None  Procedures:  Antimicrobials:   Data Reviewed: I have personally reviewed following labs and imaging studies  CBC: Recent Labs  Lab 06/07/20 1144 06/08/20 0420  WBC 9.4 8.5  HGB 15.3 14.6  HCT 46.1 44.2  MCV 84.7 85.8  PLT 192 696   Basic Metabolic Panel: Recent Labs  Lab 06/07/20 1144 06/07/20 1831 06/08/20 0420  NA 137  --  137  K 4.3  --  4.0  CL 105  --  103  CO2 24  --  24  GLUCOSE 222*  --  203*    BUN 21*  --  24*  CREATININE 1.27*  --  1.26*  CALCIUM 8.5*  --  8.7*  MG  --  1.8  --    GFR: Estimated Creatinine Clearance: 92.3 mL/min (A) (by C-G formula based on SCr of 1.26 mg/dL (H)). Liver Function Tests: Recent Labs  Lab 06/07/20 1831  AST 25  ALT 32  ALKPHOS 46  BILITOT 2.2*  PROT 6.3*  ALBUMIN 3.2*   Recent Labs  Lab 06/07/20 1831  LIPASE 26   No results for input(s): AMMONIA in the last 168 hours. Coagulation Profile: Recent Labs  Lab 06/07/20 1144  INR 1.1   Cardiac Enzymes: No results for input(s): CKTOTAL, CKMB, CKMBINDEX, TROPONINI in the last 168 hours. BNP (last 3 results) No results for input(s): PROBNP in the last 8760 hours. HbA1C: No results for input(s): HGBA1C in the last 72 hours. CBG: Recent Labs  Lab 06/07/20 1106 06/07/20 2352 06/08/20 0415 06/08/20 0820 06/08/20 1141  GLUCAP 224* 236* 177* 173* 221*   Lipid Profile: No results for input(s): CHOL, HDL, LDLCALC, TRIG, CHOLHDL, LDLDIRECT in the last 72 hours. Thyroid Function Tests: No results for input(s): TSH, T4TOTAL, FREET4, T3FREE, THYROIDAB in the last 72 hours. Anemia Panel: No results for input(s): VITAMINB12, FOLATE, FERRITIN, TIBC, IRON, RETICCTPCT in the last 72 hours. Sepsis Labs: Recent Labs  Lab 06/07/20 1714  PROCALCITON <0.10    Recent Results (from the past 240 hour(s))  Respiratory Panel by RT PCR (Flu A&B, Covid) - Nasopharyngeal Swab     Status: None   Collection Time: 06/07/20  6:31 PM   Specimen: Nasopharyngeal Swab  Result Value Ref Range Status   SARS Coronavirus 2 by RT PCR NEGATIVE NEGATIVE Final    Comment: (NOTE) SARS-CoV-2 target nucleic acids are NOT DETECTED.  The SARS-CoV-2 RNA is generally detectable in upper respiratoy specimens during the acute phase of infection. The lowest concentration of SARS-CoV-2 viral copies this assay can detect is 131 copies/mL. A negative result does not preclude SARS-Cov-2 infection and should not be used  as the sole basis for treatment or other patient management decisions. A negative result may occur with  improper specimen collection/handling, submission of specimen other than nasopharyngeal swab, presence of viral mutation(s) within the areas targeted by this assay, and inadequate number of viral copies (<131 copies/mL). A negative result must be combined with clinical observations, patient history, and epidemiological information. The expected result is Negative.  Fact Sheet for Patients:  PinkCheek.be  Fact Sheet for Healthcare Providers:  GravelBags.it  This test is no t yet approved or cleared by the [place] FDA and  has been authorized for detection and/or diagnosis of SARS-CoV-2 by FDA under an Emergency Use Authorization (EUA). This EUA will remain  in effect (meaning this test can be used) for the duration of the COVID-19 declaration under Section 564(b)(1) of the Act, 21 U.S.C. section 360bbb-3(b)(1), unless the authorization is terminated or revoked sooner.     Influenza A by PCR NEGATIVE NEGATIVE Final   Influenza B by PCR NEGATIVE NEGATIVE Final    Comment: (NOTE) The Xpert Xpress SARS-CoV-2/FLU/RSV assay is intended as an aid in  the diagnosis of influenza from Nasopharyngeal swab specimens and  should not be used as a sole basis for treatment. Nasal washings and  aspirates are unacceptable for Xpert Xpress SARS-CoV-2/FLU/RSV  testing.  Fact Sheet for Patients: PinkCheek.be  Fact Sheet for Healthcare Providers: GravelBags.it  This test is not yet approved or cleared by the [place] FDA and  has been authorized for detection and/or diagnosis of SARS-CoV-2 by  FDA under an Emergency Use Authorization (EUA). This EUA will remain  in effect (meaning this test can be used) for the duration of the  Covid-19 declaration under Section  564(b)(1) of the Act, 21  U.S.C. section 360bbb-3(b)(1), unless the authorization is  terminated or revoked. Performed at [facility], [address], [name],  [address]      Radiology Studies: DG Chest 2 View  Result Date: 06/07/2020 CLINICAL DATA:  Chest pain and shortness of breath EXAM: CHEST - 2 VIEW COMPARISON:  Chest radiograph and chest CT May 12, 2020 FINDINGS: There is cardiomegaly with pulmonary vascular congestion. There are small joint effusions bilaterally with a degree of interstitial pulmonary edema. There is mild left midlung atelectasis. No consolidation. No adenopathy. No bone lesions. No pneumothorax. IMPRESSION: Cardiomegaly with pulmonary vascular  congestion. There is a degree of interstitial pulmonary edema with small pleural effusions. The overall appearance is felt to be indicative of a degree of congestive heart failure. No consolidation. Mild atelectasis left mid lung noted. Electronically Signed   By: Lowella Grip III M.D.   On: 06/07/2020 12:13   CT Angio Chest PE W and/or Wo Contrast  Result Date: 06/07/2020 CLINICAL DATA:  Chest pain, shortness of breath EXAM: CT ANGIOGRAPHY CHEST WITH CONTRAST TECHNIQUE: Multidetector CT imaging of the chest was performed using the standard protocol during bolus administration of intravenous contrast. Multiplanar CT image reconstructions and MIPs were obtained to evaluate the vascular anatomy. CONTRAST:  45mL OMNIPAQUE IOHEXOL 350 MG/ML SOLN COMPARISON:  05/12/2020 FINDINGS: Cardiovascular: Filling defects in the lower lobe pulmonary arteries in the right lower lobe compatible with pulmonary emboli. No evidence of pulmonary emboli on the left. Scattered aortic and coronary artery calcifications. Cardiomegaly. Aorta normal caliber. Mediastinum/Nodes: Mildly enlarged mediastinal lymph nodes. No hilar or axillary adenopathy. Mediastinal lymph nodes are stable since prior study. Lungs/Pleura: Small bilateral  pleural effusions, slightly increased since prior study. Mild ground-glass opacities in the mid and lower lungs, likely mild edema. Vascular congestion. Right basilar compressive atelectasis. Upper Abdomen: Imaging into the upper abdomen demonstrates no acute findings. Musculoskeletal: Chest wall soft tissues are unremarkable. No acute bony abnormality. Review of the MIP images confirms the above findings. IMPRESSION: Filling defects within right lower lobe pulmonary arteries compatible with small pulmonary emboli. Cardiomegaly, vascular congestion and mild/early edema. Small bilateral pleural effusions, increasing since prior study. Mediastinal adenopathy, similar to prior study. Coronary artery disease. Aortic Atherosclerosis (ICD10-I70.0). Critical Value/emergent results were called by telephone at the time of interpretation on 06/07/2020 at 6:26 pm to provider [facility] , who verbally acknowledged these results. Electronically Signed   By: Rolm Baptise M.D.   On: 06/07/2020 18:27    Scheduled Meds: . carvedilol  25 mg Oral BID WC  . cloNIDine  0.05 mg Oral BID  . furosemide  60 mg Intravenous Q12H  . insulin aspart  0-15 Units Subcutaneous TID WC  . insulin aspart  0-5 Units Subcutaneous QHS  . ipratropium-albuterol  3 mL Nebulization Q6H  . rosuvastatin  20 mg Oral QHS  . sodium chloride flush  3 mL Intravenous Q12H   Continuous Infusions: . sodium chloride    . heparin 1,950 Units/hr (06/08/20 0246)     LOS: 1 day   Time spent: 35 minutes.  Lorella Nimrod, MD [facility]  If 7PM-7AM, please contact night-coverage [email or web address]  06/08/2020, 1:54 PM   This record has been created using Systems analyst. Errors have been sought and corrected,but may not always be located. Such creation errors do not reflect on the standard of care.

## 2020-06-08 NOTE — Progress Notes (Signed)
[facility] for Heparin infusion Indication: pulmonary embolus  Allergies  Allergen Reactions  . Metformin And Related Nausea And Vomiting  . Prednisone Other (See Comments)    Reaction: Hallucinations    . Zantac [Ranitidine Hcl] Diarrhea and Nausea Only    Night sweats    Patient Measurements: Height: 6' (182.9 cm) Weight: 118.8 kg (262 lb) IBW/kg (Calculated) : 77.6 Heparin Dosing Weight: 103.6 kg  Vital Signs: BP: 138/89 (10/21 0806) Pulse Rate: 67 (10/21 0806)  Labs: Recent Labs    06/07/20 1144 06/07/20 1155 06/07/20 1714 06/08/20 0120 06/08/20 0420 06/08/20 0857 06/08/20 0906  HGB 15.3  --   --   --  14.6  --   --   HCT 46.1  --   --   --  44.2  --   --   PLT 192  --   --   --  186  --   --   LABPROT 13.3  --   --   --   --   --   --   INR 1.1  --   --   --   --   --   --   HEPARINUNFRC  --   --   --  0.21*  --   --  0.37  CREATININE 1.27*  --   --   --  1.26*  --   --   TROPONINIHS  --  55* 59*  --   --  46*  --     Estimated Creatinine Clearance: 92.3 mL/min (A) (by C-G formula based on SCr of 1.26 mg/dL (H)).   Medical History: Past Medical History:  Diagnosis Date  . Chest wall pain, chronic   . Chronic systolic CHF (congestive heart failure) ([place])    a. 03/2015 Echo: EF 45-50%; b. 12/2015 Echo: EF 20-25%; c. 02/2016 Echo: EF 30-35%; d. 11/2016 Echo: EF 40-45%; e. 06/2019 Echo: EF 30-35%; f. 4/42021 Echo: EF 25-30%.  . Chronic Troponin Elevation   . CKD (chronic kidney disease), stage II-III   . COPD (chronic obstructive pulmonary disease) ([place])   . Diabetes mellitus without complication ([place])   . Hypertension    Resolved since weight loss  . NICM (nonischemic cardiomyopathy) ([place])    a. 03/2015 Echo: EF 45-50%; b. 12/2015 Echo: EF 20-25%; c. 02/2016 Echo: EF 30-35%; d. 11/2016 Echo: EF 40-45%; e. 06/2019 Echo: EF 30-35%; f. 11/2019 Echo: EF 25-30%, glob HK. Mild LVH. Gr2 DD. Mild-mod AI. BAE. Triv MR.  .  Non-obstructive CAD (coronary artery disease)    a. 04/2015 low risk MV;  b. 12/2016 Cath: minor irregs in LAD/Diag/LCX/OM, RCA 40p/m/d.  [name] (nonsustained ventricular tachycardia) ([place])    a. 12/2015 noted on tele-->amio;  b. 12/2015 Event monitor: no VT. Atach noted.  . Obesity (BMI 30.0-34.9)   . Psoriasis   . Syncope    a. 01/2016 - felt to be vasovagal.  . Tobacco abuse     Medications:  Pharmacy technician confirmed that patient has not taken Apixaban 5mg  BID in 2 months.   Assessment: 49yo male with PMH HTN, diabetes, COPD, CKD, asthma, chronic heart failure, previous tobacco use, and PE (has not taken Apixaban in 2 months). Patient presented to ED after being referred by the heart failure clinic for admission due to concerns for decompensated heart failure. Patient states that since he was last hospitalized approximately a month ago he has had worsening SOB associated with some substernal chest pressure,  cough, weight gain, abdominal distention. Patient states he has chest pressure and SOB all the time which are both exacerbated by exertion. HS troponin 55>59. 10/20 CTA chest shows evidence of volume overload as well as small pulmonary emboli. Baseline labs WNL. Pharmacy has been consulted for heparin dosing and monitoring for PE.  10/21 0100 HL 0.21 - rate increased to 1950 units/hr 10/21 0906 HL 0.37 - therapeutic x 1  Goal of Therapy:  Heparin level 0.3-0.7 units/ml Monitor platelets by anticoagulation protocol: Yes   Plan:  10/21 0906 HL = 0.37, therapeutic. CBC is stable. Will continue Heparin drip at 1950 units/hr.  Will check HL in 6 hours for confirmation.  Daily CBC while on Heparin drip.  Vira Blanco, PharmD 06/08/2020 10:26 AM

## 2020-06-08 NOTE — Progress Notes (Signed)
ANTICOAGULATION CONSULT NOTE - Initial Consult  Pharmacy Consult for Heparin infusion Indication: pulmonary embolus  Allergies  Allergen Reactions  . Metformin And Related Nausea And Vomiting  . Prednisone Other (See Comments)    Reaction: Hallucinations    . Zantac [Ranitidine Hcl] Diarrhea and Nausea Only    Night sweats    Patient Measurements: Height: 6' (182.9 cm) Weight: 118.8 kg (262 lb) IBW/kg (Calculated) : 77.6 Heparin Dosing Weight: 103.6 kg  Vital Signs: Temp: 98.1 F (36.7 C) (10/20 1627) Temp Source: Oral (10/20 1627) BP: 137/97 (10/21 0000) Pulse Rate: 67 (10/21 0000)  Labs: Recent Labs    06/07/20 1144 06/07/20 1155 06/07/20 1714 06/08/20 0120  HGB 15.3  --   --   --   HCT 46.1  --   --   --   PLT 192  --   --   --   LABPROT 13.3  --   --   --   INR 1.1  --   --   --   HEPARINUNFRC  --   --   --  0.21*  CREATININE 1.27*  --   --   --   TROPONINIHS  --  55* 59*  --     Estimated Creatinine Clearance: 91.6 mL/min (A) (by C-G formula based on SCr of 1.27 mg/dL (H)).   Medical History: Past Medical History:  Diagnosis Date  . Chest wall pain, chronic   . Chronic systolic CHF (congestive heart failure) ([place])    a. 03/2015 Echo: EF 45-50%; b. 12/2015 Echo: EF 20-25%; c. 02/2016 Echo: EF 30-35%; d. 11/2016 Echo: EF 40-45%; e. 06/2019 Echo: EF 30-35%; f. 4/42021 Echo: EF 25-30%.  . Chronic Troponin Elevation   . CKD (chronic kidney disease), stage II-III   . COPD (chronic obstructive pulmonary disease) ([place])   . Diabetes mellitus without complication ([place])   . Hypertension    Resolved since weight loss  . NICM (nonischemic cardiomyopathy) ([place])    a. 03/2015 Echo: EF 45-50%; b. 12/2015 Echo: EF 20-25%; c. 02/2016 Echo: EF 30-35%; d. 11/2016 Echo: EF 40-45%; e. 06/2019 Echo: EF 30-35%; f. 11/2019 Echo: EF 25-30%, glob HK. Mild LVH. Gr2 DD. Mild-mod AI. BAE. Triv MR.  . Non-obstructive CAD (coronary artery disease)    a. 04/2015 low risk MV;  b. 12/2016 Cath:  minor irregs in LAD/Diag/LCX/OM, RCA 40p/m/d.  [name] (nonsustained ventricular tachycardia) ([place])    a. 12/2015 noted on tele-->amio;  b. 12/2015 Event monitor: no VT. Atach noted.  . Obesity (BMI 30.0-34.9)   . Psoriasis   . Syncope    a. 01/2016 - felt to be vasovagal.  . Tobacco abuse     Medications:  Pharmacy technician confirmed that patient has not taken Apixaban 5mg  BID in 2 months.   Assessment: 49yo male with PMH HTN, diabetes, COPD, CKD, asthma, chronic heart failure, previous tobacco use, and PE (has not taken Apixaban in 2 months). Patient presented to ED after being referred by the heart failure clinic for admission due to concerns for decompensated heart failure. Patient states that since he was last hospitalized approximately a month ago he has had worsening SOB associated with some substernal chest pressure, cough, weight gain, abdominal distention. Patient states he has chest pressure and SOB all the time which are both exacerbated by exertion. HS troponin 55>59. 10/20 CTA chest shows evidence of volume overload as well as small pulmonary emboli. Baseline labs WNL. Pharmacy has been consulted for heparin dosing and monitoring  for PE.  Goal of Therapy:  Heparin level 0.3-0.7 units/ml Monitor platelets by anticoagulation protocol: Yes   Plan:  10/21: HL @ 0100 = 0.21 Will order Heparin 1500 units IV X 1 and increase drip rate to 1950 units/hr.  Will order HL 6 hrs after rate change.   Elmar Antigua D, PharmD 06/08/2020 2:28 AM

## 2020-06-08 NOTE — ED Notes (Signed)
This RN to bedside at this time. Pt c/o 10/10 chest pain at this time. This RN offered SL NTG per order, but pt refused at this time stating "That stuff busts my head open, I'll take anything else but that". Pt then states "It's not heart pain, it's just cause I'm short of breath so that hurts" This RN placed pt back on 2L O2 at this time.

## 2020-06-08 NOTE — ED Notes (Signed)
ED TO INPATIENT HANDOFF REPORT  ED Nurse Name and Phone #: Valetta Fuller [phone]  S Name/Age/Gender [name] 49 y.o. male Room/Bed: ED30A/ED30A  Code Status   Code Status: Full Code  Home/SNF/Other Home Patient oriented to: self, place, time and situation Is this baseline? Yes   Triage Complete: Triage complete  Chief Complaint Acute CHF (congestive heart failure) ([place]) [I50.9]  Triage Note Pt comes from CHF clinic with increasing CP and SOB. MD from CHF clinic wants to admit pt. Pt tachypnic and has labored breathing. Swelling of extremities.     Allergies Allergies  Allergen Reactions  . Metformin And Related Nausea And Vomiting  . Prednisone Other (See Comments)    Reaction: Hallucinations    . Zantac [Ranitidine Hcl] Diarrhea and Nausea Only    Night sweats    Level of Care/Admitting Diagnosis ED Disposition    ED Disposition Condition [facility]: [place] [[id number]]  Level of Care: Progressive Cardiac [[id number]]  Admit to Progressive based on following criteria: CARDIOVASCULAR & THORACIC of moderate stability with acute coronary syndrome symptoms/low risk myocardial infarction/hypertensive urgency/arrhythmias/heart failure potentially compromising stability and stable post cardiovascular intervention patients.  Covid Evaluation: Asymptomatic Screening Protocol (No Symptoms)  Diagnosis: Acute CHF (congestive heart failure) [facility]) [[id number]]  Admitting Physician: Athena Masse [[id number]]  Attending Physician: Athena Masse [[id number]]  Estimated length of stay: past midnight tomorrow  Certification:: I certify this patient will need inpatient services for at least 2 midnights       B Medical/Surgery History Past Medical History:  Diagnosis Date  . Chest wall pain, chronic   . Chronic systolic CHF (congestive heart failure) ([place])    a. 03/2015 Echo: EF 45-50%; b. 12/2015 Echo: EF 20-25%; c. 02/2016 Echo: EF 30-35%; d.  11/2016 Echo: EF 40-45%; e. 06/2019 Echo: EF 30-35%; f. 4/42021 Echo: EF 25-30%.  . Chronic Troponin Elevation   . CKD (chronic kidney disease), stage II-III   . COPD (chronic obstructive pulmonary disease) ([place])   . Diabetes mellitus without complication ([place])   . Hypertension    Resolved since weight loss  . NICM (nonischemic cardiomyopathy) ([place])    a. 03/2015 Echo: EF 45-50%; b. 12/2015 Echo: EF 20-25%; c. 02/2016 Echo: EF 30-35%; d. 11/2016 Echo: EF 40-45%; e. 06/2019 Echo: EF 30-35%; f. 11/2019 Echo: EF 25-30%, glob HK. Mild LVH. Gr2 DD. Mild-mod AI. BAE. Triv MR.  . Non-obstructive CAD (coronary artery disease)    a. 04/2015 low risk MV;  b. 12/2016 Cath: minor irregs in LAD/Diag/LCX/OM, RCA 40p/m/d.  [name] (nonsustained ventricular tachycardia) ([place])    a. 12/2015 noted on tele-->amio;  b. 12/2015 Event monitor: no VT. Atach noted.  . Obesity (BMI 30.0-34.9)   . Psoriasis   . Syncope    a. 01/2016 - felt to be vasovagal.  . Tobacco abuse    Past Surgical History:  Procedure Laterality Date  . AMPUTATION    . CARDIAC CATHETERIZATION    . FINGER AMPUTATION     Traumatic  . FINGER FRACTURE SURGERY Left   . LEFT HEART CATH AND CORONARY ANGIOGRAPHY N/A 01/06/2017   Procedure: Left Heart Cath and Coronary Angiography;  Surgeon: Wellington Hampshire, MD;  Location: [facility];  Service: Cardiovascular;  Laterality: N/A;     A IV Location/Drains/Wounds Patient Lines/Drains/Airways Status    Active Line/Drains/Airways    Name Placement date Placement time Site Days   Peripheral IV 06/07/20 Left Antecubital  06/07/20  1148  Antecubital  1   Peripheral IV 06/07/20 Right Forearm 06/07/20  1908  Forearm  1          Intake/Output Last 24 hours  Intake/Output Summary (Last 24 hours) at 06/08/2020 1913 Last data filed at 06/08/2020 1144 Gross per 24 hour  Intake 236 ml  Output 2825 ml  Net -2589 ml    Labs/Imaging Results for orders placed or performed during the hospital  encounter of 06/07/20 (from the past 48 hour(s))  Basic metabolic panel     Status: Abnormal   Collection Time: 06/07/20 11:44 AM  Result Value Ref Range   Sodium 137 135 - 145 mmol/L   Potassium 4.3 3.5 - 5.1 mmol/L   Chloride 105 98 - 111 mmol/L   CO2 24 22 - 32 mmol/L   Glucose, Bld 222 (H) 70 - 99 mg/dL    Comment: Glucose reference range applies only to samples taken after fasting for at least 8 hours.   BUN 21 (H) 6 - 20 mg/dL   Creatinine, Ser 1.27 (H) 0.61 - 1.24 mg/dL   Calcium 8.5 (L) 8.9 - 10.3 mg/dL   GFR, Estimated >60 >60 mL/min   Anion gap 8 5 - 15    Comment: Performed at [facility], [address] [place], [place] [address]  CBC     Status: None   Collection Time: 06/07/20 11:44 AM  Result Value Ref Range   WBC 9.4 4.0 - 10.5 K/uL   RBC 5.44 4.22 - 5.81 MIL/uL   Hemoglobin 15.3 13.0 - 17.0 g/dL   HCT 46.1 39 - 52 %   MCV 84.7 80.0 - 100.0 fL   MCH 28.1 26.0 - 34.0 pg   MCHC 33.2 30.0 - 36.0 g/dL   RDW 13.9 11.5 - 15.5 %   Platelets 192 150 - 400 K/uL   nRBC 0.0 0.0 - 0.2 %    Comment: Performed at [facility], [address], [place], [place] [address]  Protime-INR (order if Patient is taking Coumadin / Warfarin)     Status: None   Collection Time: 06/07/20 11:44 AM  Result Value Ref Range   Prothrombin Time 13.3 11.4 - 15.2 seconds   INR 1.1 0.8 - 1.2    Comment: (NOTE) INR goal varies based on device and disease states. Performed at [facility], [address] [place], [place] [address]   Brain natriuretic peptide     Status: Abnormal   Collection Time: 06/07/20 11:44 AM  Result Value Ref Range   B Natriuretic Peptide 1,566.8 (H) 0.0 - 100.0 pg/mL    Comment: Performed at [facility], [place], [place] [address]  Troponin I (High Sensitivity)     Status: Abnormal   Collection Time: 06/07/20 11:55 AM  Result Value Ref Range   Troponin I (High Sensitivity) 55 (H) <18 ng/L    Comment:  (NOTE) Elevated high sensitivity troponin I (hsTnI) values and significant  changes across serial measurements may suggest ACS but many other  chronic and acute conditions are known to elevate hsTnI results.  Refer to the "Links" section for chest pain algorithms and additional  guidance. Performed at [facility], [address] [place], [place] [address]   Troponin I (High Sensitivity)     Status: Abnormal   Collection Time: 06/07/20  5:14 PM  Result Value Ref Range   Troponin I (High Sensitivity) 59 (H) <18 ng/L    Comment: (  NOTE) Elevated high sensitivity troponin I (hsTnI) values and significant  changes across serial measurements may suggest ACS but many other  chronic and acute conditions are known to elevate hsTnI results.  Refer to the "Links" section for chest pain algorithms and additional  guidance. Performed at [facility], [address] [place], [place] [address]   Procalcitonin - Baseline     Status: None   Collection Time: 06/07/20  5:14 PM  Result Value Ref Range   Procalcitonin <0.10 ng/mL    Comment:        Interpretation: PCT (Procalcitonin) <= 0.5 ng/mL: Systemic infection (sepsis) is not likely. Local bacterial infection is possible. (NOTE)       Sepsis PCT Algorithm           Lower Respiratory Tract                                      Infection PCT Algorithm    ----------------------------     ----------------------------         PCT < 0.25 ng/mL                PCT < 0.10 ng/mL          Strongly encourage             Strongly discourage   discontinuation of antibiotics    initiation of antibiotics    ----------------------------     -----------------------------       PCT 0.25 - 0.50 ng/mL            PCT 0.10 - 0.25 ng/mL               OR       >80% decrease in PCT            Discourage initiation of                                            antibiotics      Encourage discontinuation           of antibiotics     ----------------------------     -----------------------------         PCT >= 0.50 ng/mL              PCT 0.26 - 0.50 ng/mL               AND        <80% decrease in PCT             Encourage initiation of                                             antibiotics       Encourage continuation           of antibiotics    ----------------------------     -----------------------------        PCT >= 0.50 ng/mL                  PCT > 0.50 ng/mL               AND  increase in PCT                  Strongly encourage                                      initiation of antibiotics    Strongly encourage escalation           of antibiotics                                     -----------------------------                                           PCT <= 0.25 ng/mL                                                 OR                                        > 80% decrease in PCT                                      Discontinue / Do not initiate                                             antibiotics  Performed at [facility], [address] [place] [address], [place], [place] [address]   Magnesium     Status: None   Collection Time: 06/07/20  6:31 PM  Result Value Ref Range   Magnesium 1.8 1.7 - 2.4 mg/dL    Comment: Performed at [facility], [address], [place], [place] [address]  Respiratory Panel by RT PCR (Flu A&B, Covid) - Nasopharyngeal Swab     Status: None   Collection Time: 06/07/20  6:31 PM   Specimen: Nasopharyngeal Swab  Result Value Ref Range   SARS Coronavirus 2 by RT PCR NEGATIVE NEGATIVE    Comment: (NOTE) SARS-CoV-2 target nucleic acids are NOT DETECTED.  The SARS-CoV-2 RNA is generally detectable in upper respiratoy specimens during the acute phase of infection. The lowest concentration of SARS-CoV-2 viral copies this assay can detect is 131 copies/mL. A negative result does not preclude SARS-Cov-2 infection and should not be used as the sole basis for treatment  or other patient management decisions. A negative result may occur with  improper specimen collection/handling, submission of specimen other than nasopharyngeal swab, presence of viral mutation(s) within the areas targeted by this assay, and inadequate number of viral copies (<131 copies/mL). A negative result must be combined with clinical observations, patient history, and epidemiological information. The expected result is Negative.  Fact Sheet for Patients:  PinkCheek.be  Fact Sheet for Healthcare Providers:  GravelBags.it  This test is no t yet approved or cleared by the [place]  FDA and  has been authorized for detection and/or diagnosis of SARS-CoV-2 by FDA under an Emergency Use Authorization (EUA). This EUA will remain  in effect (meaning this test can be used) for the duration of the COVID-19 declaration under Section 564(b)(1) of the Act, 21 U.S.C. section 360bbb-3(b)(1), unless the authorization is terminated or revoked sooner.     Influenza A by PCR NEGATIVE NEGATIVE   Influenza B by PCR NEGATIVE NEGATIVE    Comment: (NOTE) The Xpert Xpress SARS-CoV-2/FLU/RSV assay is intended as an aid in  the diagnosis of influenza from Nasopharyngeal swab specimens and  should not be used as a sole basis for treatment. Nasal washings and  aspirates are unacceptable for Xpert Xpress SARS-CoV-2/FLU/RSV  testing.  Fact Sheet for Patients: PinkCheek.be  Fact Sheet for Healthcare Providers: GravelBags.it  This test is not yet approved or cleared by the [place] FDA and  has been authorized for detection and/or diagnosis of SARS-CoV-2 by  FDA under an Emergency Use Authorization (EUA). This EUA will remain  in effect (meaning this test can be used) for the duration of the  Covid-19 declaration under Section 564(b)(1) of the Act, 21  U.S.C. section  360bbb-3(b)(1), unless the authorization is  terminated or revoked. Performed at [facility], [address] [place], [place] [address]   Hepatic function panel     Status: Abnormal   Collection Time: 06/07/20  6:31 PM  Result Value Ref Range   Total Protein 6.3 (L) 6.5 - 8.1 g/dL   Albumin 3.2 (L) 3.5 - 5.0 g/dL   AST 25 15 - 41 U/L   ALT 32 0 - 44 U/L   Alkaline Phosphatase 46 38 - 126 U/L   Total Bilirubin 2.2 (H) 0.3 - 1.2 mg/dL   Bilirubin, Direct 0.3 (H) 0.0 - 0.2 mg/dL   Indirect Bilirubin 1.9 (H) 0.3 - 0.9 mg/dL    Comment: Performed at [facility], [address] [place], [place] [address]  Lipase, blood     Status: None   Collection Time: 06/07/20  6:31 PM  Result Value Ref Range   Lipase 26 11 - 51 U/L    Comment: Performed at [facility], [address] [place], [place] [address]  Glucose, capillary     Status: Abnormal   Collection Time: 06/07/20 11:52 PM  Result Value Ref Range   Glucose-Capillary 236 (H) 70 - 99 mg/dL    Comment: Glucose reference range applies only to samples taken after fasting for at least 8 hours.  Heparin level (unfractionated)     Status: Abnormal   Collection Time: 06/08/20  1:20 AM  Result Value Ref Range   Heparin Unfractionated 0.21 (L) 0.30 - 0.70 IU/mL    Comment: (NOTE) If heparin results are below expected values, and patient dosage has  been confirmed, suggest follow up testing of antithrombin III levels. Performed at [facility], [address] [place], [place] [address]   Glucose, capillary     Status: Abnormal   Collection Time: 06/08/20  4:15 AM  Result Value Ref Range   Glucose-Capillary 177 (H) 70 - 99 mg/dL    Comment: Glucose reference range applies only to samples taken after fasting for at least 8 hours.  CBC     Status: None   Collection Time: 06/08/20  4:20 AM  Result Value Ref Range   WBC 8.5 4.0 - 10.5 K/uL   RBC 5.15 4.22 - 5.81 MIL/uL   Hemoglobin 14.6 13.0 -  17.0 g/dL   HCT 44.2 39 - 52 %   MCV 85.8 80.0 - 100.0 fL   MCH 28.3 26.0 - 34.0 pg   MCHC 33.0 30.0 - 36.0 g/dL   RDW 14.0 11.5 - 15.5 %   Platelets 186 150 - 400 K/uL   nRBC 0.0 0.0 - 0.2 %    Comment: Performed at [facility], [address], [place], [place] [address]  Basic metabolic panel     Status: Abnormal   Collection Time: 06/08/20  4:20 AM  Result Value Ref Range   Sodium 137 135 - 145 mmol/L   Potassium 4.0 3.5 - 5.1 mmol/L   Chloride 103 98 - 111 mmol/L   CO2 24 22 - 32 mmol/L   Glucose, Bld 203 (H) 70 - 99 mg/dL    Comment: Glucose reference range applies only to samples taken after fasting for at least 8 hours.   BUN 24 (H) 6 - 20 mg/dL   Creatinine, Ser 1.26 (H) 0.61 - 1.24 mg/dL   Calcium 8.7 (L) 8.9 - 10.3 mg/dL   GFR, Estimated >60 >60 mL/min   Anion gap 10 5 - 15    Comment: Performed at [facility], [address] [place], [place] [address]  Glucose, capillary     Status: Abnormal   Collection Time: 06/08/20  8:20 AM  Result Value Ref Range   Glucose-Capillary 173 (H) 70 - 99 mg/dL    Comment: Glucose reference range applies only to samples taken after fasting for at least 8 hours.  Troponin I (High Sensitivity)     Status: Abnormal   Collection Time: 06/08/20  8:57 AM  Result Value Ref Range   Troponin I (High Sensitivity) 46 (H) <18 ng/L    Comment: (NOTE) Elevated high sensitivity troponin I (hsTnI) values and significant  changes across serial measurements may suggest ACS but many other  chronic and acute conditions are known to elevate hsTnI results.  Refer to the "Links" section for chest pain algorithms and additional  guidance. Performed at [facility], [place], [place] [address]   Heparin level (unfractionated)     Status: None   Collection Time: 06/08/20  9:06 AM  Result Value Ref Range   Heparin Unfractionated 0.37 0.30 - 0.70 IU/mL    Comment: (NOTE) If heparin results are below expected  values, and patient dosage has  been confirmed, suggest follow up testing of antithrombin III levels. Performed at [facility], [address] [place], [place] [address]   Glucose, capillary     Status: Abnormal   Collection Time: 06/08/20 11:41 AM  Result Value Ref Range   Glucose-Capillary 221 (H) 70 - 99 mg/dL    Comment: Glucose reference range applies only to samples taken after fasting for at least 8 hours.  Glucose, capillary     Status: Abnormal   Collection Time: 06/08/20  4:04 PM  Result Value Ref Range   Glucose-Capillary 175 (H) 70 - 99 mg/dL    Comment: Glucose reference range applies only to samples taken after fasting for at least 8 hours.   DG Chest 2 View  Result Date: 06/07/2020 CLINICAL DATA:  Chest pain and shortness of breath EXAM: CHEST - 2 VIEW COMPARISON:  Chest radiograph and chest CT May 12, 2020 FINDINGS: There is cardiomegaly with pulmonary vascular congestion. There are small joint effusions bilaterally with a degree of interstitial pulmonary edema. There is mild left midlung atelectasis. No consolidation. No  adenopathy. No bone lesions. No pneumothorax. IMPRESSION: Cardiomegaly with pulmonary vascular congestion. There is a degree of interstitial pulmonary edema with small pleural effusions. The overall appearance is felt to be indicative of a degree of congestive heart failure. No consolidation. Mild atelectasis left mid lung noted. Electronically Signed   By: Lowella Grip III M.D.   On: 06/07/2020 12:13   CT Angio Chest PE W and/or Wo Contrast  Result Date: 06/07/2020 CLINICAL DATA:  Chest pain, shortness of breath EXAM: CT ANGIOGRAPHY CHEST WITH CONTRAST TECHNIQUE: Multidetector CT imaging of the chest was performed using the standard protocol during bolus administration of intravenous contrast. Multiplanar CT image reconstructions and MIPs were obtained to evaluate the vascular anatomy. CONTRAST:  35mL OMNIPAQUE IOHEXOL 350 MG/ML SOLN  COMPARISON:  05/12/2020 FINDINGS: Cardiovascular: Filling defects in the lower lobe pulmonary arteries in the right lower lobe compatible with pulmonary emboli. No evidence of pulmonary emboli on the left. Scattered aortic and coronary artery calcifications. Cardiomegaly. Aorta normal caliber. Mediastinum/Nodes: Mildly enlarged mediastinal lymph nodes. No hilar or axillary adenopathy. Mediastinal lymph nodes are stable since prior study. Lungs/Pleura: Small bilateral pleural effusions, slightly increased since prior study. Mild ground-glass opacities in the mid and lower lungs, likely mild edema. Vascular congestion. Right basilar compressive atelectasis. Upper Abdomen: Imaging into the upper abdomen demonstrates no acute findings. Musculoskeletal: Chest wall soft tissues are unremarkable. No acute bony abnormality. Review of the MIP images confirms the above findings. IMPRESSION: Filling defects within right lower lobe pulmonary arteries compatible with small pulmonary emboli. Cardiomegaly, vascular congestion and mild/early edema. Small bilateral pleural effusions, increasing since prior study. Mediastinal adenopathy, similar to prior study. Coronary artery disease. Aortic Atherosclerosis (ICD10-I70.0). Critical Value/emergent results were called by telephone at the time of interpretation on 06/07/2020 at 6:26 pm to provider [facility] , who verbally acknowledged these results. Electronically Signed   By: Rolm Baptise M.D.   On: 06/07/2020 18:27   ECHOCARDIOGRAM COMPLETE  Result Date: 06/08/2020    ECHOCARDIOGRAM REPORT   Patient Name:   Calven Gilkes. Date of Exam: 06/08/2020 Medical Rec #:  [id number]           Height:       72.0 in Accession #:    [id number]          Weight:       262.0 lb Date of Birth:  1970/08/25           BSA:          2.390 m Patient Age:    38 years            BP:           138/89 mmHg Patient Gender: M                   HR:           75 bpm. Exam Location:  [facility] Procedure: 2D  Echo, Color Doppler, Cardiac Doppler and Intracardiac            Opacification Agent Indications:     Y10.17 CHF-Acute Systolic  History:         Patient has prior history of Echocardiogram examinations, most                  recent 11/23/2019. NICM, CAD, COPD and CKD; Risk Factors:Current                  Smoker, Hypertension and Diabetes.  Sonographer:  Charmayne Sheer RDCS (AE) Referring Phys:  [id number] Athena Masse Diagnosing Phys: [name] MD  Sonographer Comments: Suboptimal apical window and no subcostal window. Image acquisition challenging due to patient body habitus and Image acquisition challenging due to COPD. IMPRESSIONS  1. Left ventricular ejection fraction, by estimation, is 35 to 40%. The left ventricle has moderately decreased function. The left ventricle demonstrates global hypokinesis. The left ventricular internal cavity size was moderately dilated. Left ventricular diastolic parameters are indeterminate.  2. Right ventricular systolic function is normal. The right ventricular size is normal. There is mildly elevated pulmonary artery systolic pressure. The estimated right ventricular systolic pressure is 01.7 mmHg.  3. Left atrial size was moderately dilated.  4. Mild to moderate mitral valve regurgitation. FINDINGS  Left Ventricle: Left ventricular ejection fraction, by estimation, is 35 to 40%. The left ventricle has moderately decreased function. The left ventricle demonstrates global hypokinesis. Definity contrast agent was given IV to delineate the left ventricular endocardial borders. The left ventricular internal cavity size was moderately dilated. There is no left ventricular hypertrophy. Left ventricular diastolic parameters are indeterminate. Right Ventricle: The right ventricular size is normal. No increase in right ventricular wall thickness. Right ventricular systolic function is normal. There is mildly elevated pulmonary artery systolic pressure. The tricuspid regurgitant  velocity is 3.15  m/s, and with an assumed right atrial pressure of 5 mmHg, the estimated right ventricular systolic pressure is 51.0 mmHg. Left Atrium: Left atrial size was moderately dilated. Right Atrium: Right atrial size was normal in size. Pericardium: There is no evidence of pericardial effusion. Mitral Valve: The mitral valve is normal in structure. Mild to moderate mitral valve regurgitation. No evidence of mitral valve stenosis. MV peak gradient, 5.3 mmHg. The mean mitral valve gradient is 2.0 mmHg. Tricuspid Valve: The tricuspid valve is normal in structure. Tricuspid valve regurgitation is mild . No evidence of tricuspid stenosis. Aortic Valve: The aortic valve is normal in structure. Aortic valve regurgitation is mild. No aortic stenosis is present. Aortic valve mean gradient measures 3.0 mmHg. Aortic valve peak gradient measures 6.1 mmHg. Aortic valve area, by VTI measures 3.86 cm. Pulmonic Valve: The pulmonic valve was normal in structure. Pulmonic valve regurgitation is not visualized. No evidence of pulmonic stenosis. Aorta: The aortic root is normal in size and structure. Venous: The inferior vena cava is normal in size with greater than 50% respiratory variability, suggesting right atrial pressure of 3 mmHg. IAS/Shunts: No atrial level shunt detected by color flow Doppler.  LEFT VENTRICLE PLAX 2D LVIDd:         6.18 cm LVIDs:         5.35 cm LV PW:         1.30 cm LV IVS:        1.03 cm LVOT diam:     2.70 cm LV SV:         72 LV SV Index:   30 LVOT Area:     5.73 cm  LV Volumes (MOD) LV vol d, MOD A2C: 120.0 ml LV vol d, MOD A4C: 189.0 ml LV vol s, MOD A2C: 62.2 ml LV vol s, MOD A4C: 87.2 ml LV SV MOD A2C:     57.8 ml LV SV MOD A4C:     189.0 ml LV SV MOD BP:      77.4 ml RIGHT VENTRICLE RV Basal diam:  4.35 cm LEFT ATRIUM           Index  RIGHT ATRIUM           Index LA diam:      5.50 cm 2.30 cm/m  RA Area:     22.90 cm LA Vol (A4C): 93.3 ml 39.04 ml/m RA Volume:   67.70 ml  28.33  ml/m  AORTIC VALVE                   PULMONIC VALVE AV Area (Vmax):    3.74 cm    PV Vmax:       0.81 m/s AV Area (Vmean):   3.69 cm    PV Vmean:      52.300 cm/s AV Area (VTI):     3.86 cm    PV VTI:        0.124 m AV Vmax:           123.00 cm/s PV Peak grad:  2.6 mmHg AV Vmean:          83.700 cm/s PV Mean grad:  1.0 mmHg AV VTI:            0.187 m AV Peak Grad:      6.1 mmHg AV Mean Grad:      3.0 mmHg LVOT Vmax:         80.30 cm/s LVOT Vmean:        54.000 cm/s LVOT VTI:          0.126 m LVOT/AV VTI ratio: 0.67  AORTA Ao Root diam: 3.70 cm MITRAL VALVE                TRICUSPID VALVE MV Area (PHT): 5.09 cm     TR Peak grad:   39.7 mmHg MV Peak grad:  5.3 mmHg     TR Vmax:        315.00 cm/s MV Mean grad:  2.0 mmHg MV Vmax:       1.15 m/s     SHUNTS MV Vmean:      69.5 cm/s    Systemic VTI:  0.13 m MV Decel Time: 149 msec     Systemic Diam: 2.70 cm MV E velocity: 102.00 cm/s MV A velocity: 52.70 cm/s MV E/A ratio:  1.94 [name] MD Electronically signed by [name] MD Signature Date/Time: 06/08/2020/5:02:43 PM    Final     Pending Labs Unresulted Labs (From admission, onward)          Start     Ordered   06/09/20 0500  CBC  Tomorrow morning,   STAT        06/08/20 1052   06/08/20 8119  Basic metabolic panel  Daily,   STAT      06/07/20 1929   06/07/20 1750  Hemoglobin A1c  Add-on,   AD        06/07/20 1749          Vitals/Pain Today's Vitals   06/08/20 0734 06/08/20 0806 06/08/20 0911 06/08/20 0912  BP:  138/89    Pulse:  67    Resp:  20    Temp:      TempSrc:      SpO2:  98%  99%  Weight:      Height:      PainSc: 10-Worst pain ever  5      Isolation Precautions No active isolations  Medications Medications  rosuvastatin (CRESTOR) tablet 20 mg (0 mg Oral Hold 06/08/20 0554)  carvedilol (COREG) tablet 25 mg (25 mg Oral Given 06/08/20 1609)  cloNIDine (  CATAPRES) tablet 0.05 mg (0.05 mg Oral Given 06/08/20 0820)  nitroGLYCERIN (NITROSTAT) SL tablet 0.4 mg  (has no administration in time range)  sodium chloride flush (NS) 0.9 % injection 3 mL (3 mLs Intravenous Given 06/08/20 0827)  sodium chloride flush (NS) 0.9 % injection 3 mL (has no administration in time range)  0.9 %  sodium chloride infusion (has no administration in time range)  acetaminophen (TYLENOL) tablet 650 mg (has no administration in time range)  ondansetron (ZOFRAN) injection 4 mg (4 mg Intravenous Given 06/08/20 1845)  furosemide (LASIX) injection 60 mg (60 mg Intravenous Given 06/08/20 1814)  insulin aspart (novoLOG) injection 0-15 Units (3 Units Subcutaneous Given 06/08/20 1609)  insulin aspart (novoLOG) injection 0-5 Units (0 Units Subcutaneous Not Given 06/08/20 0116)  ipratropium-albuterol (DUONEB) 0.5-2.5 (3) MG/3ML nebulizer solution 3 mL (3 mLs Nebulization Given 06/08/20 1605)  albuterol (PROVENTIL) (2.5 MG/3ML) 0.083% nebulizer solution 2.5 mg (has no administration in time range)  Rivaroxaban (XARELTO) tablet 15 mg (15 mg Oral Given 06/08/20 1609)    Followed by  rivaroxaban (XARELTO) tablet 20 mg (has no administration in time range)  heparin ADULT infusion 100 units/mL (25000 units/229mL sodium chloride 0.45%) (0 Units/hr Intravenous Stopped 06/08/20 1616)  insulin detemir (LEVEMIR) injection 10 Units (has no administration in time range)  furosemide (LASIX) injection 80 mg (80 mg Intravenous Given 06/07/20 1836)  ipratropium-albuterol (DUONEB) 0.5-2.5 (3) MG/3ML nebulizer solution 3 mL (3 mLs Nebulization Given 06/07/20 1836)  iohexol (OMNIPAQUE) 350 MG/ML injection 75 mL (75 mLs Intravenous Contrast Given 06/07/20 1800)  heparin bolus via infusion 6,200 Units (6,200 Units Intravenous Bolus from Bag 06/07/20 1855)  heparin bolus via infusion 1,500 Units (1,500 Units Intravenous Bolus from Bag 06/08/20 0246)  morphine 2 MG/ML injection 2 mg (2 mg Intravenous Given 06/08/20 0820)  ketorolac (TORADOL) 30 MG/ML injection 30 mg (30 mg Intravenous Given 06/08/20 1308)     Mobility walks Low fall risk   Focused Assessments Cardiac Assessment Handoff:  Cardiac Rhythm: Normal sinus rhythm Lab Results  Component Value Date   CKTOTAL 53 08/26/2012   CKMB < 0.5 (L) 08/26/2012   TROPONINI 0.05 (HH) 11/19/2018   No results found for: DDIMER Does the Patient currently have chest pain? No     R Recommendations: See Admitting Provider Note  Report given to:   Additional Notes: Patient is currently chest pain free, chest pain was as high as 5/10 today per RN report tonight. Refuses nitro 2/2 headache. Toradol helped with musculoskeletal pain and morphine helped with cardiac pain. Patient did report continued indigestion on day shift today.

## 2020-06-09 ENCOUNTER — Other Ambulatory Visit: Payer: Self-pay

## 2020-06-09 ENCOUNTER — Other Ambulatory Visit: Payer: Self-pay | Admitting: Internal Medicine

## 2020-06-09 DIAGNOSIS — R739 Hyperglycemia, unspecified: Secondary | ICD-10-CM | POA: Diagnosis not present

## 2020-06-09 DIAGNOSIS — I509 Heart failure, unspecified: Secondary | ICD-10-CM | POA: Diagnosis not present

## 2020-06-09 DIAGNOSIS — I10 Essential (primary) hypertension: Secondary | ICD-10-CM | POA: Diagnosis not present

## 2020-06-09 DIAGNOSIS — I2699 Other pulmonary embolism without acute cor pulmonale: Secondary | ICD-10-CM | POA: Diagnosis not present

## 2020-06-09 LAB — HEMOGLOBIN A1C
Hgb A1c MFr Bld: 9.3 % — ABNORMAL HIGH (ref 4.8–5.6)
Mean Plasma Glucose: 220 mg/dL

## 2020-06-09 LAB — BASIC METABOLIC PANEL
Anion gap: 11 (ref 5–15)
BUN: 32 mg/dL — ABNORMAL HIGH (ref 6–20)
CO2: 27 mmol/L (ref 22–32)
Calcium: 9.2 mg/dL (ref 8.9–10.3)
Chloride: 100 mmol/L (ref 98–111)
Creatinine, Ser: 1.62 mg/dL — ABNORMAL HIGH (ref 0.61–1.24)
GFR, Estimated: 51 mL/min — ABNORMAL LOW (ref >60)
Glucose, Bld: 169 mg/dL — ABNORMAL HIGH (ref 70–99)
Potassium: 3.8 mmol/L (ref 3.5–5.1)
Sodium: 138 mmol/L (ref 135–145)

## 2020-06-09 LAB — GLUCOSE, CAPILLARY
Glucose-Capillary: 190 mg/dL — ABNORMAL HIGH (ref 70–99)
Glucose-Capillary: 207 mg/dL — ABNORMAL HIGH (ref 70–99)

## 2020-06-09 LAB — CBC
HCT: 43.7 % (ref 39.0–52.0)
Hemoglobin: 14.4 g/dL (ref 13.0–17.0)
MCH: 28.3 pg (ref 26.0–34.0)
MCHC: 33 g/dL (ref 30.0–36.0)
MCV: 85.9 fL (ref 80.0–100.0)
Platelets: 175 10*3/uL (ref 150–400)
RBC: 5.09 MIL/uL (ref 4.22–5.81)
RDW: 14.1 % (ref 11.5–15.5)
WBC: 7 10*3/uL (ref 4.0–10.5)
nRBC: 0 % (ref 0.0–0.2)

## 2020-06-09 MED ORDER — INSULIN LISPRO (1 UNIT DIAL) 100 UNIT/ML (KWIKPEN): 3.0000 [IU] | PEN_INJECTOR | Freq: Three times a day (TID) | SUBCUTANEOUS | 3 refills | Status: DC

## 2020-06-09 MED ORDER — NITROGLYCERIN 0.4 MG SL SUBL: 0.4000 mg | SUBLINGUAL_TABLET | SUBLINGUAL | 0 refills | Status: DC | PRN

## 2020-06-09 MED ORDER — CLONIDINE HCL 0.1 MG PO TABS: 0.0500 mg | ORAL_TABLET | Freq: Two times a day (BID) | ORAL | 11 refills | Status: DC

## 2020-06-09 MED ORDER — BLOOD GLUCOSE MONITOR KIT: PACK | 3 refills | Status: DC

## 2020-06-09 MED ORDER — FENOFIBRATE 145 MG PO TABS: 145.0000 mg | ORAL_TABLET | Freq: Every day | ORAL | 6 refills | Status: DC

## 2020-06-09 MED ORDER — SPIRONOLACTONE 25 MG PO TABS: 25.0000 mg | ORAL_TABLET | Freq: Two times a day (BID) | ORAL | 2 refills | Status: DC

## 2020-06-09 MED ORDER — BASAGLAR KWIKPEN 100 UNIT/ML ~~LOC~~ SOPN: 28.0000 [IU] | PEN_INJECTOR | Freq: Every evening | SUBCUTANEOUS | 2 refills | Status: DC

## 2020-06-09 MED ORDER — PNEUMOCOCCAL VAC POLYVALENT 25 MCG/0.5ML IJ INJ: 0.5000 mL | INJECTION | INTRAMUSCULAR | Status: DC

## 2020-06-09 MED ORDER — TORSEMIDE 20 MG PO TABS: 40.0000 mg | ORAL_TABLET | Freq: Every day | ORAL | 3 refills | Status: DC

## 2020-06-09 MED ORDER — CARVEDILOL 25 MG PO TABS: 25.0000 mg | ORAL_TABLET | Freq: Two times a day (BID) | ORAL | 2 refills | Status: DC

## 2020-06-09 MED ORDER — RIVAROXABAN (XARELTO) VTE STARTER PACK (15 & 20 MG): ORAL_TABLET | ORAL | 0 refills | Status: DC

## 2020-06-09 MED ORDER — INSULIN PEN NEEDLE 29G X 5MM MISC
1.0000 | Freq: Three times a day (TID) | 0 refills | Status: DC
Start: 2020-06-09 — End: 2020-06-12

## 2020-06-09 MED ORDER — SACUBITRIL-VALSARTAN 49-51 MG PO TABS
1.0000 | ORAL_TABLET | Freq: Two times a day (BID) | ORAL | 3 refills | Status: DC
Start: 2020-06-09 — End: 2020-06-14

## 2020-06-09 MED ORDER — INFLUENZA VAC SPLIT QUAD 0.5 ML IM SUSY: 0.5000 mL | PREFILLED_SYRINGE | INTRAMUSCULAR | Status: DC

## 2020-06-09 MED ORDER — IPRATROPIUM-ALBUTEROL 20-100 MCG/ACT IN AERS: 1.0000 | INHALATION_SPRAY | Freq: Four times a day (QID) | RESPIRATORY_TRACT | 1 refills | Status: DC

## 2020-06-09 MED ORDER — ROSUVASTATIN CALCIUM 20 MG PO TABS
20.0000 mg | ORAL_TABLET | Freq: Every day | ORAL | 6 refills | Status: DC
Start: 2020-06-09 — End: 2021-05-29

## 2020-06-09 NOTE — Progress Notes (Signed)
Discharge instructions explained/pt verbalized understanding. IV and tele remove. Pt ambulated off unit with NT at his side.

## 2020-06-09 NOTE — TOC Progression Note (Addendum)
Transition of Care (TOC) - Progression Note    Patient Details  Name: Gabriel [name]. MRN: [id number] Date of Birth: 09-11-1970  Transition of Care [facility]) CM/SW Contact  Eileen Stanford, LCSW Phone Number: 06/09/2020, 1:35 PM  Clinical Narrative:  MD asked that  CSW find pt a PCP. CSW got pt a appointment at [facility] and pt states he does not get along with the doctors there. Pt then proceeds to say he wants to set up a appointment at the Clinic in [place] his father goes to. Pt states he doesn't know the name of it but that his father has the number. CSW offered to make the apt or pt however pt states no I will do it. CSW expressed the importance of this follow up apt. Pt also states he doesn't need assistance with his meds. He states he can afford them and he has transport to get them. MD notified.  Xerelto coupon put on pt's chart.     Expected Discharge Plan: Skilled Nursing Facility Barriers to Discharge: Continued Medical Work up  Expected Discharge Plan and Services Expected Discharge Plan: [place] In-house Referral: Clinical Social Work   Post Acute Care Choice: [place] arrangements for the past 2 months: Single Family Home                                       Social Determinants of Health (SDOH) Interventions    Readmission Risk Interventions Readmission Risk Prevention Plan 05/14/2020 12/21/2019  Transportation Screening Complete Complete  PCP or Specialist Appt within 3-5 Days Complete Complete  HRI or [place] Complete Complete  Social Work Consult for [place] Planning/Counseling - Complete  Palliative Care Screening Not Applicable Not Applicable  Medication Review Press photographer) Complete Complete  Some recent data might be hidden

## 2020-06-09 NOTE — Discharge Summary (Signed)
Physician Discharge Summary  [name] ZGY:[id number] DOB: [birth year] DOA: 06/07/2020  PCP: Patient, No Pcp Per  Admit date: 06/07/2020 Discharge date: 06/09/2020  Admitted From: Home Disposition: Home  Recommendations for Outpatient Follow-up:  1. Follow up with PCP in 1-2 weeks 2. Please obtain BMP/CBC in one week 3. Please follow up on the following pending results: None  Home Health: No Equipment/Devices: None Discharge Condition: Stable CODE STATUS: Full Diet recommendation: Heart Healthy / Carb Modified   Brief/Interim Summary: TRIGG DELAROCHA Jr.is a 49 y.o.malewith medical history significant forCAD, systolic CHF, HTN, DM, COPD, CKD stage III, history of PE noncompliant with Xarelto hospitalized from 9/24to9/26 with heart failure exacerbation admits to being noncompliant with medicationfor the past 2 months who was sent by the heart failure clinic to the emergency room for chest pain and shortness of breath. Patient states that since his hospitalization a month ago he has continued to have shortness of breath with increasing weight gain and abdominal distention. Lately he has been having increasing chest pressure, cough congestion and fatigue.  Found to have elevated BNP, T bili and creatinine.  Borderline elevation of troponin with a flat curve.  CTA with filling defects within the right lower lobe pulmonary arteries compatible with small PEs.  Initially started on heparin infusion and then later transitioned to Xarelto.  Per patient he was not taking his meds due to financial issues but promised to start taking his medication as now he has Medicaid and want [place] to [facility] which was done on discharge.   Patient has an EF of 25 to 30% on echo done in April 2021. Appears little volume up as he was not taking any of his medications. Initially given IV Lasix which was discontinued after increasing creatinine. Was on torsemide at home which he was  not taking. [facility] and advised to continue taking all of his medications which include torsemide, spironolactone and Entresto and follow-up in heart failure clinic.  Patient was also advised to follow-up with a primary care provider as he has any insurance now. An appointment was made with PCP but patient would like to go to a different provider and will make his own appointment.  Discharge Diagnoses:  Active Problems:   Essential hypertension   COPD exacerbation (HCC)   Troponin I above reference range   NICM (nonischemic cardiomyopathy) ([place])   Coronary artery disease, non-occlusive   Acute on chronic systolic CHF (congestive heart failure) ([place])   Noncompliance with medications   Acute on chronic heart failure ([place])   Pulmonary embolism without acute cor pulmonale ([place])   Acute pulmonary embolism ([place])   Uncontrolled type 2 diabetes mellitus with hyperglycemia, with long-term current use of insulin [facility])   Discharge Instructions  Discharge Instructions    AMB referral to CHF clinic   Complete by: As directed    Avoid straining   Complete by: As directed    Diet - low sodium heart healthy   Complete by: As directed    Discharge instructions   Complete by: As directed    It was pleasure taking care of you. Please start taking your medications from tomorrow. As we discussed it is very important that you get established with a primary care provider and a cardiologist. Please follow-up in heart failure clinic for further recommendations.   Heart Failure patients record your daily weight using the same scale at the same time of day   Complete  by: As directed    Increase activity slowly   Complete by: As directed    STOP any activity that causes chest pain, shortness of breath, dizziness, sweating, or exessive weakness   Complete by: As directed      Allergies as of 06/09/2020      Reactions   Metformin And Related Nausea And Vomiting    Prednisone Other (See Comments)   Reaction: Hallucinations    Zantac [ranitidine Hcl] Diarrhea, Nausea Only   Night sweats      Medication List    STOP taking these medications   apixaban 5 MG Tabs tablet Commonly known as: ELIQUIS   lidocaine 5 % Commonly known as: LIDODERM     TAKE these medications   Basaglar KwikPen 100 UNIT/ML Inject 28 Units into the skin at bedtime.   blood glucose meter kit and supplies Kit Dispense based on patient and insurance preference. Use up to four times daily as directed. (FOR ICD-9 250.00, 250.01).   carvedilol 25 MG tablet Commonly known as: COREG Take 1 tablet (25 mg total) by mouth 2 (two) times daily with a meal.   cloNIDine 0.1 MG tablet Commonly known as: CATAPRES Take 0.5 tablets (0.05 mg total) by mouth 2 (two) times daily. What changed: additional instructions   fenofibrate 145 MG tablet Commonly known as: Tricor Take 1 tablet (145 mg total) by mouth daily.   insulin lispro 100 UNIT/ML KwikPen Commonly known as: HUMALOG Inject 3 Units into the skin 3 (three) times daily before meals.   Insulin Pen Needle 29G X 5MM Misc 1 Dose by Does not apply route 4 (four) times daily -  with meals and at bedtime.   Ipratropium-Albuterol 20-100 MCG/ACT Aers respimat Commonly known as: COMBIVENT Inhale 1 puff into the lungs every 6 (six) hours.   nitroGLYCERIN 0.4 MG SL tablet Commonly known as: NITROSTAT Place 1 tablet (0.4 mg total) under the tongue every 5 (five) minutes x 3 doses as needed for chest pain.   Rivaroxaban Stater Pack (15 mg and 20 mg) Commonly known as: XARELTO STARTER PACK Follow package directions: Take one 60m tablet by mouth twice a day. On day 22, switch to one 252mtablet once a day. Take with food.   rosuvastatin 20 MG tablet Commonly known as: CRESTOR Take 1 tablet (20 mg total) by mouth at bedtime.   sacubitril-valsartan 49-51 MG Commonly known as: ENTRESTO Take 1 tablet by mouth 2 (two) times  daily.   spironolactone 25 MG tablet Commonly known as: ALDACTONE Take 1 tablet (25 mg total) by mouth 2 (two) times daily.   torsemide 20 MG tablet Commonly known as: DEMADEX Take 2 tablets (40 mg total) by mouth daily. What changed: when to take this       Follow[place]up Information    [place].   Specialty: Cardiology Why: at 11:00am. Enter through the [facility] Contact information: [address]L[facility] Laser CenterGo to.   Contact information: [address] [place] [phone]            Allergies  Allergen Reactions  . Metformin And Related Nausea And Vomiting  . Prednisone Other (See Comments)    Reaction: Hallucinations    . Zantac [Ranitidine Hcl] Diarrhea and Nausea Only    Night sweats    Consultations:  None  Procedures/Studies: DG Chest 2 View  Result Date: 06/07/2020 CLINICAL DATA:  Chest pain and shortness of breath EXAM: CHEST - 2 VIEW COMPARISON:  Chest radiograph and chest CT May 12, 2020 FINDINGS: There is cardiomegaly with pulmonary vascular congestion. There are small joint effusions bilaterally with a degree of interstitial pulmonary edema. There is mild left midlung atelectasis. No consolidation. No adenopathy. No bone lesions. No pneumothorax. IMPRESSION: Cardiomegaly with pulmonary vascular congestion. There is a degree of interstitial pulmonary edema with small pleural effusions. The overall appearance is felt to be indicative of a degree of congestive heart failure. No consolidation. Mild atelectasis left mid lung noted. Electronically Signed   By: Lowella Grip III M.D.   On: 06/07/2020 12:13   DG Chest 2 View  Result Date: 05/12/2020 CLINICAL DATA:  Chest pain short of breath EXAM: CHEST - 2 VIEW COMPARISON:  05/04/2020 FINDINGS: Cardiac  enlargement. Mild vascular congestion has progressed in the interval. Progression of mild bibasilar airspace disease. Minimal pleural fluid bilaterally unchanged. IMPRESSION: Cardiac enlargement with vascular congestion which shows mild progression. Small bilateral effusions. Mild bibasilar atelectasis has progressed. Electronically Signed   By: Franchot Gallo M.D.   On: 05/12/2020 12:46   CT Angio Chest PE W and/or Wo Contrast  Result Date: 06/07/2020 CLINICAL DATA:  Chest pain, shortness of breath EXAM: CT ANGIOGRAPHY CHEST WITH CONTRAST TECHNIQUE: Multidetector CT imaging of the chest was performed using the standard protocol during bolus administration of intravenous contrast. Multiplanar CT image reconstructions and MIPs were obtained to evaluate the vascular anatomy. CONTRAST:  54m OMNIPAQUE IOHEXOL 350 MG/ML SOLN COMPARISON:  05/12/2020 FINDINGS: Cardiovascular: Filling defects in the lower lobe pulmonary arteries in the right lower lobe compatible with pulmonary emboli. No evidence of pulmonary emboli on the left. Scattered aortic and coronary artery calcifications. Cardiomegaly. Aorta normal caliber. Mediastinum/Nodes: Mildly enlarged mediastinal lymph nodes. No hilar or axillary adenopathy. Mediastinal lymph nodes are stable since prior study. Lungs/Pleura: Small bilateral pleural effusions, slightly increased since prior study. Mild ground-glass opacities in the mid and lower lungs, likely mild edema. Vascular congestion. Right basilar compressive atelectasis. Upper Abdomen: Imaging into the upper abdomen demonstrates no acute findings. Musculoskeletal: Chest wall soft tissues are unremarkable. No acute bony abnormality. Review of the MIP images confirms the above findings. IMPRESSION: Filling defects within right lower lobe pulmonary arteries compatible with small pulmonary emboli. Cardiomegaly, vascular congestion and mild/early edema. Small bilateral pleural effusions, increasing since prior  study. Mediastinal adenopathy, similar to prior study. Coronary artery disease. Aortic Atherosclerosis (ICD10-I70.0). Critical Value/emergent results were called by telephone at the time of interpretation on 06/07/2020 at 6:26 pm [facility], who verbally acknowledged these results. Electronically Signed   By: KRolm BaptiseM.D.   On: 06/07/2020 18:27   CT ANGIO CHEST AORTA W/CM &/OR WO/CM  Result Date: 05/12/2020 CLINICAL DATA:  Chest pain and shortness of breath for 2 weeks EXAM: CT ANGIOGRAPHY CHEST WITH CONTRAST TECHNIQUE: Multidetector CT imaging of the chest was performed using the standard protocol during bolus administration of intravenous contrast. Multiplanar CT image reconstructions and MIPs were obtained to evaluate the vascular anatomy. CONTRAST:  1068mOMNIPAQUE IOHEXOL 350 MG/ML SOLN COMPARISON:  12/31/2019 FINDINGS: Cardiovascular: This is a technically adequate evaluation of the thoracic aorta. No evidence of aneurysm or dissection. No significant atherosclerosis. The heart is mildly enlarged but stable. No pericardial effusion. Minimal atherosclerosis within the coronary vasculature, most pronounced in the LAD and circumflex distributions. Mediastinum/Nodes: Stable borderline enlarged mediastinal and hilar  adenopathy, measuring up to 2 cm in the subcarinal region. Thyroid, trachea, and esophagus are unremarkable. Lungs/Pleura: Residual consolidation in the right lower lobe likely reflects resolving pulmonary infarct after previous right lower lobe pulmonary embolus. There are small bilateral pleural effusions, less than 200 cc each. No acute airspace disease. Minimal left upper lobe atelectasis. No pneumothorax. The central airways are patent. Upper Abdomen: No acute abnormality. Musculoskeletal: Stable bone island right third rib. No acute or destructive bony lesions. Reconstructed images demonstrate no additional findings. Review of the MIP images confirms the above findings.  IMPRESSION: 1. No evidence of thoracic aortic aneurysm or dissection. 2. Stable mediastinal and hilar lymphadenopathy, nonspecific. 3. Improving right lower lobe consolidation, likely representing resolving pulmonary infarct after previous right lower lobe pulmonary embolus. 4. Trace bilateral pleural effusions. 5. Prominent coronary artery atherosclerosis, stable. Electronically Signed   By: Randa Ngo M.D.   On: 05/12/2020 18:28   ECHOCARDIOGRAM COMPLETE  Result Date: 06/08/2020    ECHOCARDIOGRAM REPORT   Patient Name:   Gabriel [name]. Date of Exam: 06/08/2020 Medical Rec #:  [id number]           Height:       72.0 in Accession #:    [id number]          Weight:       262.0 lb Date of Birth:  10/18/1970           BSA:          2.390 m Patient Age:    49 years            BP:           138/89 mmHg Patient Gender: M                   HR:           75 bpm. Exam Location:  [facility] Procedure: 2D Echo, Color Doppler, Cardiac Doppler and Intracardiac            Opacification Agent Indications:     T55.73 CHF-Acute Systolic  History:         Patient has prior history of Echocardiogram examinations, most                  recent 11/23/2019. NICM, CAD, COPD and CKD; Risk Factors:Current                  Smoker, Hypertension and Diabetes.  Sonographer:     Charmayne Sheer RDCS (AE) Referring Phys:  [id number] Athena Masse Diagnosing Phys: [name] MD  Sonographer Comments: Suboptimal apical window and no subcostal window. Image acquisition challenging due to patient body habitus and Image acquisition challenging due to COPD. IMPRESSIONS  1. Left ventricular ejection fraction, by estimation, is 35 to 40%. The left ventricle has moderately decreased function. The left ventricle demonstrates global hypokinesis. The left ventricular internal cavity size was moderately dilated. Left ventricular diastolic parameters are indeterminate.  2. Right ventricular systolic function is normal. The right ventricular size is normal.  There is mildly elevated pulmonary artery systolic pressure. The estimated right ventricular systolic pressure is 70.6 mmHg.  3. Left atrial size was moderately dilated.  4. Mild to moderate mitral valve regurgitation. FINDINGS  Left Ventricle: Left ventricular ejection fraction, by estimation, is 35 to 40%. The left ventricle has moderately decreased function. The left ventricle demonstrates global hypokinesis. Definity contrast agent was given IV to delineate the left ventricular endocardial borders. The  left ventricular internal cavity size was moderately dilated. There is no left ventricular hypertrophy. Left ventricular diastolic parameters are indeterminate. Right Ventricle: The right ventricular size is normal. No increase in right ventricular wall thickness. Right ventricular systolic function is normal. There is mildly elevated pulmonary artery systolic pressure. The tricuspid regurgitant velocity is 3.15  m/s, and with an assumed right atrial pressure of 5 mmHg, the estimated right ventricular systolic pressure is 75.4 mmHg. Left Atrium: Left atrial size was moderately dilated. Right Atrium: Right atrial size was normal in size. Pericardium: There is no evidence of pericardial effusion. Mitral Valve: The mitral valve is normal in structure. Mild to moderate mitral valve regurgitation. No evidence of mitral valve stenosis. MV peak gradient, 5.3 mmHg. The mean mitral valve gradient is 2.0 mmHg. Tricuspid Valve: The tricuspid valve is normal in structure. Tricuspid valve regurgitation is mild . No evidence of tricuspid stenosis. Aortic Valve: The aortic valve is normal in structure. Aortic valve regurgitation is mild. No aortic stenosis is present. Aortic valve mean gradient measures 3.0 mmHg. Aortic valve peak gradient measures 6.1 mmHg. Aortic valve area, by VTI measures 3.86 cm. Pulmonic Valve: The pulmonic valve was normal in structure. Pulmonic valve regurgitation is not visualized. No evidence of  pulmonic stenosis. Aorta: The aortic root is normal in size and structure. Venous: The inferior vena cava is normal in size with greater than 50% respiratory variability, suggesting right atrial pressure of 3 mmHg. IAS/Shunts: No atrial level shunt detected by color flow Doppler.  LEFT VENTRICLE PLAX 2D LVIDd:         6.18 cm LVIDs:         5.35 cm LV PW:         1.30 cm LV IVS:        1.03 cm LVOT diam:     2.70 cm LV SV:         72 LV SV Index:   30 LVOT Area:     5.73 cm  LV Volumes (MOD) LV vol d, MOD A2C: 120.0 ml LV vol d, MOD A4C: 189.0 ml LV vol s, MOD A2C: 62.2 ml LV vol s, MOD A4C: 87.2 ml LV SV MOD A2C:     57.8 ml LV SV MOD A4C:     189.0 ml LV SV MOD BP:      77.4 ml RIGHT VENTRICLE RV Basal diam:  4.35 cm LEFT ATRIUM           Index       RIGHT ATRIUM           Index LA diam:      5.50 cm 2.30 cm/m  RA Area:     22.90 cm LA Vol (A4C): 93.3 ml 39.04 ml/m RA Volume:   67.70 ml  28.33 ml/m  AORTIC VALVE                   PULMONIC VALVE AV Area (Vmax):    3.74 cm    PV Vmax:       0.81 m/s AV Area (Vmean):   3.69 cm    PV Vmean:      52.300 cm/s AV Area (VTI):     3.86 cm    PV VTI:        0.124 m AV Vmax:           123.00 cm/s PV Peak grad:  2.6 mmHg AV Vmean:          83.700 cm/s  PV Mean grad:  1.0 mmHg AV VTI:            0.187 m AV Peak Grad:      6.1 mmHg AV Mean Grad:      3.0 mmHg LVOT Vmax:         80.30 cm/s LVOT Vmean:        54.000 cm/s LVOT VTI:          0.126 m LVOT/AV VTI ratio: 0.67  AORTA Ao Root diam: 3.70 cm MITRAL VALVE                TRICUSPID VALVE MV Area (PHT): 5.09 cm     TR Peak grad:   39.7 mmHg MV Peak grad:  5.3 mmHg     TR Vmax:        315.00 cm/s MV Mean grad:  2.0 mmHg MV Vmax:       1.15 m/s     SHUNTS MV Vmean:      69.5 cm/s    Systemic VTI:  0.13 m MV Decel Time: 149 msec     Systemic Diam: 2.70 cm MV E velocity: 102.00 cm/s MV A velocity: 52.70 cm/s MV E/A ratio:  1.94 [name] MD Electronically signed by [name] MD Signature Date/Time:  06/08/2020/5:02:43 PM    Final      Subjective: Patient continue to feel little down, chest pain and shortness of breath has been improved. Had another long discussion regarding taking his medications regularly and follow-up with his PCP and cardiology. Appears euvolemic today.  Discharge Exam: Vitals:   06/09/20 0835 06/09/20 1146  BP:  116/73  Pulse: 63 62  Resp: 20 19  Temp:  98.1 F (36.7 C)  SpO2: 92% 93%   Vitals:   06/09/20 0339 06/09/20 0753 06/09/20 0835 06/09/20 1146  BP: 112/73 110/64  116/73  Pulse: (!) 59 (!) 57 63 62  Resp: _0 Temp: (!) 97.4 F (36.3 C) 98.4 F (36.9 C)  98.1 F (36.7 C)  TempSrc: Axillary   Oral  SpO2: 98% 90% 92% 93%  Weight: 116.9 kg     Height:        General: Pt is alert, awake, not in acute distress Cardiovascular: RRR, S1/S2 +, no rubs, no gallops Respiratory: CTA bilaterally, no wheezing, no rhonchi Abdominal: Soft, NT, ND, bowel sounds + Extremities: no edema, no cyanosis   The results of significant diagnostics from this hospitalization (including imaging, microbiology, ancillary and laboratory) are listed below for reference.    Microbiology: Recent Results (from the past 240 hour(s))  Respiratory Panel by RT PCR (Flu A&B, Covid) - Nasopharyngeal Swab     Status: None   Collection Time: 06/07/20  6:31 PM   Specimen: Nasopharyngeal Swab  Result Value Ref Range Status   SARS Coronavirus 2 by RT PCR NEGATIVE NEGATIVE Final    Comment: (NOTE) SARS-CoV-2 target nucleic acids are NOT DETECTED.  The SARS-CoV-2 RNA is generally detectable in upper respiratoy specimens during the acute phase of infection. The lowest concentration of SARS-CoV-2 viral copies this assay can detect is 131 copies/mL. A negative result does not preclude SARS-Cov-2 infection and should not be used as the sole basis for treatment or other patient management decisions. A negative result may occur with  improper specimen collection/handling,  submission of specimen other than nasopharyngeal swab, presence of viral mutation(s) within the areas targeted by this assay, and inadequate number of viral copies (<131 copies/mL). A negative  result must be combined with clinical observations, patient history, and epidemiological information. The expected result is Negative.  Fact Sheet for Patients:  PinkCheek.be  Fact Sheet for Healthcare Providers:  GravelBags.it  This test is no t yet approved or cleared by the [place] FDA and  has been authorized for detection and/or diagnosis of SARS-CoV-2 by FDA under an Emergency Use Authorization (EUA). This EUA will remain  in effect (meaning this test can be used) for the duration of the COVID-19 declaration under Section 564(b)(1) of the Act, 21 U.S.C. section 360bbb-3(b)(1), unless the authorization is terminated or revoked sooner.     Influenza A by PCR NEGATIVE NEGATIVE Final   Influenza B by PCR NEGATIVE NEGATIVE Final    Comment: (NOTE) The Xpert Xpress SARS-CoV-2/FLU/RSV assay is intended as an aid in  the diagnosis of influenza from Nasopharyngeal swab specimens and  should not be used as a sole basis for treatment. Nasal washings and  aspirates are unacceptable for Xpert Xpress SARS-CoV-2/FLU/RSV  testing.  Fact Sheet for Patients: PinkCheek.be  Fact Sheet for Healthcare Providers: GravelBags.it  This test is not yet approved or cleared by the [place] FDA and  has been authorized for detection and/or diagnosis of SARS-CoV-2 by  FDA under an Emergency Use Authorization (EUA). This EUA will remain  in effect (meaning this test can be used) for the duration of the  Covid-19 declaration under Section 564(b)(1) of the Act, 21  U.S.C. section 360bbb-3(b)(1), unless the authorization is  terminated or revoked. Performed at [facility], [address] [place], [place] [address]      Labs: BNP (last 3 results) Recent Labs    12/20/19 0903 05/12/20 1229 06/07/20 1144  BNP 2,920.0* 1,168.1* 1,700.1*   Basic Metabolic Panel: Recent Labs  Lab 06/07/20 1144 06/07/20 1831 06/08/20 0420 06/09/20 0514  NA 137  --  137 138  K 4.3  --  4.0 3.8  CL 105  --  103 100  CO2 24  --  24 27  GLUCOSE 222*  --  203* 169*  BUN 21*  --  24* 32*  CREATININE 1.27*  --  1.26* 1.62*  CALCIUM 8.5*  --  8.7* 9.2  MG  --  1.8  --   --    Liver Function Tests: Recent Labs  Lab 06/07/20 1831  AST 25  ALT 32  ALKPHOS 46  BILITOT 2.2*  PROT 6.3*  ALBUMIN 3.2*   Recent Labs  Lab 06/07/20 1831  LIPASE 26   No results for input(s): AMMONIA in the last 168 hours. CBC: Recent Labs  Lab 06/07/20 1144 06/08/20 0420 06/09/20 0514  WBC 9.4 8.5 7.0  HGB 15.3 14.6 14.4  HCT 46.1 44.2 43.7  MCV 84.7 85.8 85.9  PLT 192 186 175   Cardiac Enzymes: No results for input(s): CKTOTAL, CKMB, CKMBINDEX, TROPONINI in the last 168 hours. BNP: Invalid input(s): POCBNP CBG: Recent Labs  Lab 06/08/20 1141 06/08/20 1604 06/08/20 2028 06/09/20 0750 06/09/20 1146  GLUCAP 221* 175* 196* 190* 207*   D-Dimer No results for input(s): DDIMER in the last 72 hours. Hgb A1c Recent Labs    06/07/20 1831  HGBA1C 9.3*   Lipid Profile No results for input(s): CHOL, HDL, LDLCALC, TRIG, CHOLHDL, LDLDIRECT in the last 72 hours. Thyroid function studies No results for input(s): TSH, T4TOTAL, T3FREE, THYROIDAB in the last 72 hours.  Invalid input(s): FREET3 Anemia work up No results for input(s): VITAMINB12, FOLATE, FERRITIN, TIBC, IRON,  RETICCTPCT in the last 72 hours. Urinalysis    Component Value Date/Time   COLORURINE STRAW (A) 01/09/2018 1659   APPEARANCEUR Clear 03/08/2020 0951   LABSPEC 1.026 01/09/2018 1659   LABSPEC 1.019 11/12/2012 1713   PHURINE 6.0 01/09/2018 1659   GLUCOSEU 3+ (A) 03/08/2020 0951   GLUCOSEU Negative  11/12/2012 1713   HGBUR SMALL (A) 01/09/2018 1659   BILIRUBINUR Negative 03/08/2020 0951   BILIRUBINUR Negative 11/12/2012 1713   KETONESUR 5 (A) 01/09/2018 1659   PROTEINUR Negative 03/08/2020 0951   PROTEINUR 100 (A) 01/09/2018 1659   NITRITE Negative 03/08/2020 0951   NITRITE NEGATIVE 01/09/2018 1659   LEUKOCYTESUR Negative 03/08/2020 0951   LEUKOCYTESUR Negative 11/12/2012 1713   Sepsis Labs Invalid input(s): PROCALCITONIN,  WBC,  LACTICIDVEN Microbiology Recent Results (from the past 240 hour(s))  Respiratory Panel by RT PCR (Flu A&B, Covid) - Nasopharyngeal Swab     Status: None   Collection Time: 06/07/20  6:31 PM   Specimen: Nasopharyngeal Swab  Result Value Ref Range Status   SARS Coronavirus 2 by RT PCR NEGATIVE NEGATIVE Final    Comment: (NOTE) SARS-CoV-2 target nucleic acids are NOT DETECTED.  The SARS-CoV-2 RNA is generally detectable in upper respiratoy specimens during the acute phase of infection. The lowest concentration of SARS-CoV-2 viral copies this assay can detect is 131 copies/mL. A negative result does not preclude SARS-Cov-2 infection and should not be used as the sole basis for treatment or other patient management decisions. A negative result may occur with  improper specimen collection/handling, submission of specimen other than nasopharyngeal swab, presence of viral mutation(s) within the areas targeted by this assay, and inadequate number of viral copies (<131 copies/mL). A negative result must be combined with clinical observations, patient history, and epidemiological information. The expected result is Negative.  Fact Sheet for Patients:  PinkCheek.be  Fact Sheet for Healthcare Providers:  GravelBags.it  This test is no t yet approved or cleared by the [place] FDA and  has been authorized for detection and/or diagnosis of SARS-CoV-2 by FDA under an Emergency Use Authorization  (EUA). This EUA will remain  in effect (meaning this test can be used) for the duration of the COVID-19 declaration under Section 564(b)(1) of the Act, 21 U.S.C. section 360bbb-3(b)(1), unless the authorization is terminated or revoked sooner.     Influenza A by PCR NEGATIVE NEGATIVE Final   Influenza B by PCR NEGATIVE NEGATIVE Final    Comment: (NOTE) The Xpert Xpress SARS-CoV-2/FLU/RSV assay is intended as an aid in  the diagnosis of influenza from Nasopharyngeal swab specimens and  should not be used as a sole basis for treatment. Nasal washings and  aspirates are unacceptable for Xpert Xpress SARS-CoV-2/FLU/RSV  testing.  Fact Sheet for Patients: PinkCheek.be  Fact Sheet for Healthcare Providers: GravelBags.it  This test is not yet approved or cleared by the [place] FDA and  has been authorized for detection and/or diagnosis of SARS-CoV-2 by  FDA under an Emergency Use Authorization (EUA). This EUA will remain  in effect (meaning this test can be used) for the duration of the  Covid-19 declaration under Section 564(b)(1) of the Act, 21  U.S.C. section 360bbb-3(b)(1), unless the authorization is  terminated or revoked. Performed at [facility], [address] [place], [place] [address]     Time coordinating discharge: Over 30 minutes  SIGNED:  Lorella Nimrod, MD  [facility] 06/09/2020, 1:41 PM  If 7PM-7AM, please contact night-coverage [email or web address]  This record has  been created using Systems analyst. Errors have been sought and corrected,but may not always be located. Such creation errors do not reflect on the standard of care.

## 2020-06-09 NOTE — TOC Benefit Eligibility Note (Signed)
Transition of Care [facility]) Benefit Eligibility Note    Patient Details  Name: Gabriel [name]. MRN: [id number] Date of Birth: 1971-04-24   Medication/Dose: Louanna Raw  15 MG BID  Covered?: Yes  Tier:  (TIER- 1 DRUG)  Prescription Coverage Preferred Pharmacy: [facility]  Spoke with Person/Company/Phone Number:: VICKIE  @ [place] [employer] # [phone]  Co-Pay: $3.00  Prior Approval: No  Deductible:  (NO DEDUCTIBLE  and   NO OUT-OF-POCKET)       Memory Argue Phone Number: 06/09/2020, 1:22 PM

## 2020-06-09 NOTE — Progress Notes (Signed)
Inpatient Diabetes Program Recommendations  AACE/ADA: New Consensus Statement on Inpatient Glycemic Control (2015)  Target Ranges:  Prepandial:   less than 140 mg/dL      Peak postprandial:   less than 180 mg/dL (1-2 hours)      Critically ill patients:  140 - 180 mg/dL   Results for Gabriel [name], Gabriel [name] (MRN [id number]) as of 06/09/2020 13:21  Ref. Range 06/09/2020 07:50 06/09/2020 11:46  Glucose-Capillary Latest Ref Range: 70 - 99 mg/dL 190 (H) 207 (H)     Home DM Meds: Basaglar 28 QHS (NOT taking)        Humalog 3 units TID (NOT taking)    Current Orders: Levemir 10 units QHS       Novolog 0-15 units ac/hs.     Started Levemir last PM and Changed Novolog to ac/hs    MD- Please consider the following:  1. Increase Levemir to 15 units QHS  2. Start Novolog Meal Coverage: Novolog 3 units TID with meals  (Please add the following Hold Parameters: Hold if pt eats <50% of meal, Hold if pt NPO)    --Will follow patient during hospitalization--  Wyn Quaker RN, MSN, CDE Diabetes Coordinator Inpatient Glycemic Control Team Team Pager: [phone] (8a-5p)

## 2020-06-09 NOTE — Plan of Care (Signed)
  Problem: Education: Goal: Knowledge of General Education information will improve Description: Including pain rating scale, medication(s)/side effects and non-pharmacologic comfort measures Outcome: Progressing  Educated patient on signs and symptoms of increased fluid volume. Educated patient on daily weight, fluid and sodium restriction, when to call doctor and importance of adherence to medication regimen. Patient displayed acceptance to teaching.

## 2020-06-11 ENCOUNTER — Emergency Department: Payer: Medicaid Other

## 2020-06-11 ENCOUNTER — Other Ambulatory Visit: Payer: Self-pay

## 2020-06-11 ENCOUNTER — Inpatient Hospital Stay
Admission: EM | Admit: 2020-06-11 | Discharge: 2020-06-14 | DRG: 286 | Disposition: A | Discharge status: Home / Self Care | Payer: Medicaid Other | Attending: Internal Medicine | Admitting: Internal Medicine

## 2020-06-11 ENCOUNTER — Encounter: Payer: Self-pay | Admitting: Emergency Medicine

## 2020-06-11 DIAGNOSIS — J449 Chronic obstructive pulmonary disease, unspecified: Secondary | ICD-10-CM | POA: Diagnosis present

## 2020-06-11 DIAGNOSIS — N183 Chronic kidney disease, stage 3 unspecified: Secondary | ICD-10-CM | POA: Diagnosis present

## 2020-06-11 DIAGNOSIS — I25110 Atherosclerotic heart disease of native coronary artery with unstable angina pectoris: Secondary | ICD-10-CM | POA: Diagnosis present

## 2020-06-11 DIAGNOSIS — Z20822 Contact with and (suspected) exposure to covid-19: Secondary | ICD-10-CM | POA: Diagnosis present

## 2020-06-11 DIAGNOSIS — Z86711 Personal history of pulmonary embolism: Secondary | ICD-10-CM | POA: Diagnosis not present

## 2020-06-11 DIAGNOSIS — I255 Ischemic cardiomyopathy: Secondary | ICD-10-CM | POA: Diagnosis present

## 2020-06-11 DIAGNOSIS — I420 Dilated cardiomyopathy: Secondary | ICD-10-CM | POA: Diagnosis present

## 2020-06-11 DIAGNOSIS — T45516A Underdosing of anticoagulants, initial encounter: Secondary | ICD-10-CM | POA: Diagnosis present

## 2020-06-11 DIAGNOSIS — Z86718 Personal history of other venous thrombosis and embolism: Secondary | ICD-10-CM

## 2020-06-11 DIAGNOSIS — I25118 Atherosclerotic heart disease of native coronary artery with other forms of angina pectoris: Secondary | ICD-10-CM | POA: Diagnosis present

## 2020-06-11 DIAGNOSIS — Z6834 Body mass index (BMI) 34.0-34.9, adult: Secondary | ICD-10-CM

## 2020-06-11 DIAGNOSIS — I080 Rheumatic disorders of both mitral and aortic valves: Secondary | ICD-10-CM | POA: Diagnosis present

## 2020-06-11 DIAGNOSIS — R252 Cramp and spasm: Secondary | ICD-10-CM | POA: Diagnosis present

## 2020-06-11 DIAGNOSIS — I10 Essential (primary) hypertension: Secondary | ICD-10-CM | POA: Diagnosis not present

## 2020-06-11 DIAGNOSIS — N1831 Chronic kidney disease, stage 3a: Secondary | ICD-10-CM | POA: Diagnosis present

## 2020-06-11 DIAGNOSIS — E785 Hyperlipidemia, unspecified: Secondary | ICD-10-CM | POA: Diagnosis present

## 2020-06-11 DIAGNOSIS — Z87891 Personal history of nicotine dependence: Secondary | ICD-10-CM

## 2020-06-11 DIAGNOSIS — D72829 Elevated white blood cell count, unspecified: Secondary | ICD-10-CM | POA: Diagnosis present

## 2020-06-11 DIAGNOSIS — E782 Mixed hyperlipidemia: Secondary | ICD-10-CM | POA: Diagnosis present

## 2020-06-11 DIAGNOSIS — E1122 Type 2 diabetes mellitus with diabetic chronic kidney disease: Secondary | ICD-10-CM | POA: Diagnosis present

## 2020-06-11 DIAGNOSIS — I2582 Chronic total occlusion of coronary artery: Secondary | ICD-10-CM | POA: Diagnosis present

## 2020-06-11 DIAGNOSIS — I5023 Acute on chronic systolic (congestive) heart failure: Secondary | ICD-10-CM | POA: Diagnosis not present

## 2020-06-11 DIAGNOSIS — I5022 Chronic systolic (congestive) heart failure: Secondary | ICD-10-CM | POA: Diagnosis not present

## 2020-06-11 DIAGNOSIS — I2699 Other pulmonary embolism without acute cor pulmonale: Secondary | ICD-10-CM | POA: Diagnosis present

## 2020-06-11 DIAGNOSIS — Z8249 Family history of ischemic heart disease and other diseases of the circulatory system: Secondary | ICD-10-CM

## 2020-06-11 DIAGNOSIS — I130 Hypertensive heart and chronic kidney disease with heart failure and stage 1 through stage 4 chronic kidney disease, or unspecified chronic kidney disease: Principal | ICD-10-CM | POA: Diagnosis present

## 2020-06-11 DIAGNOSIS — E1165 Type 2 diabetes mellitus with hyperglycemia: Secondary | ICD-10-CM

## 2020-06-11 DIAGNOSIS — Z9114 Patient's other noncompliance with medication regimen: Secondary | ICD-10-CM | POA: Diagnosis not present

## 2020-06-11 DIAGNOSIS — I214 Non-ST elevation (NSTEMI) myocardial infarction: Secondary | ICD-10-CM | POA: Diagnosis not present

## 2020-06-11 DIAGNOSIS — E669 Obesity, unspecified: Secondary | ICD-10-CM | POA: Diagnosis present

## 2020-06-11 DIAGNOSIS — R778 Other specified abnormalities of plasma proteins: Secondary | ICD-10-CM | POA: Diagnosis not present

## 2020-06-11 DIAGNOSIS — I248 Other forms of acute ischemic heart disease: Secondary | ICD-10-CM | POA: Diagnosis present

## 2020-06-11 DIAGNOSIS — Z794 Long term (current) use of insulin: Secondary | ICD-10-CM

## 2020-06-11 DIAGNOSIS — I2510 Atherosclerotic heart disease of native coronary artery without angina pectoris: Secondary | ICD-10-CM | POA: Diagnosis not present

## 2020-06-11 DIAGNOSIS — I272 Pulmonary hypertension, unspecified: Secondary | ICD-10-CM | POA: Diagnosis present

## 2020-06-11 DIAGNOSIS — E1129 Type 2 diabetes mellitus with other diabetic kidney complication: Secondary | ICD-10-CM | POA: Diagnosis present

## 2020-06-11 DIAGNOSIS — Z91128 Patient's intentional underdosing of medication regimen for other reason: Secondary | ICD-10-CM

## 2020-06-11 DIAGNOSIS — Z79899 Other long term (current) drug therapy: Secondary | ICD-10-CM

## 2020-06-11 DIAGNOSIS — Z833 Family history of diabetes mellitus: Secondary | ICD-10-CM

## 2020-06-11 LAB — CBC
HCT: 51.5 % (ref 39.0–52.0)
Hemoglobin: 16.6 g/dL (ref 13.0–17.0)
MCH: 28 pg (ref 26.0–34.0)
MCHC: 32.2 g/dL (ref 30.0–36.0)
MCV: 86.8 fL (ref 80.0–100.0)
Platelets: 219 10*3/uL (ref 150–400)
RBC: 5.93 MIL/uL — ABNORMAL HIGH (ref 4.22–5.81)
RDW: 14 % (ref 11.5–15.5)
WBC: 11.8 10*3/uL — ABNORMAL HIGH (ref 4.0–10.5)
nRBC: 0 % (ref 0.0–0.2)

## 2020-06-11 LAB — GLUCOSE, CAPILLARY
Glucose-Capillary: 188 mg/dL — ABNORMAL HIGH (ref 70–99)
Glucose-Capillary: 187 mg/dL — ABNORMAL HIGH (ref 70–99)
Glucose-Capillary: 181 mg/dL — ABNORMAL HIGH (ref 70–99)
Glucose-Capillary: 162 mg/dL — ABNORMAL HIGH (ref 70–99)

## 2020-06-11 LAB — TROPONIN I (HIGH SENSITIVITY)
Troponin I (High Sensitivity): 760 ng/L (ref <18)
Troponin I (High Sensitivity): 679 ng/L (ref <18)

## 2020-06-11 LAB — LIPID PANEL
Cholesterol: 126 mg/dL (ref 0–200)
HDL: 25 mg/dL — ABNORMAL LOW (ref >40)
LDL Cholesterol: 75 mg/dL (ref 0–99)
Total CHOL/HDL Ratio: 5 RATIO
Triglycerides: 130 mg/dL (ref <150)
VLDL: 26 mg/dL (ref 0–40)

## 2020-06-11 LAB — BASIC METABOLIC PANEL
Anion gap: 11 (ref 5–15)
BUN: 30 mg/dL — ABNORMAL HIGH (ref 6–20)
CO2: 23 mmol/L (ref 22–32)
Calcium: 9.8 mg/dL (ref 8.9–10.3)
Chloride: 104 mmol/L (ref 98–111)
Creatinine, Ser: 1.44 mg/dL — ABNORMAL HIGH (ref 0.61–1.24)
GFR, Estimated: 59 mL/min — ABNORMAL LOW (ref >60)
Glucose, Bld: 237 mg/dL — ABNORMAL HIGH (ref 70–99)
Potassium: 4.7 mmol/L (ref 3.5–5.1)
Sodium: 138 mmol/L (ref 135–145)

## 2020-06-11 LAB — APTT
aPTT: 30 seconds (ref 24–36)
aPTT: 80 seconds — ABNORMAL HIGH (ref 24–36)

## 2020-06-11 LAB — PROTIME-INR
INR: 1.1 (ref 0.8–1.2)
Prothrombin Time: 13.9 seconds (ref 11.4–15.2)

## 2020-06-11 LAB — RESPIRATORY PANEL BY RT PCR (FLU A&B, COVID)
Influenza A by PCR: NEGATIVE
Influenza B by PCR: NEGATIVE
SARS Coronavirus 2 by RT PCR: NEGATIVE

## 2020-06-11 LAB — HEPARIN LEVEL (UNFRACTIONATED): Heparin Unfractionated: 0.56 IU/mL (ref 0.30–0.70)

## 2020-06-11 LAB — BRAIN NATRIURETIC PEPTIDE: B Natriuretic Peptide: 1553.1 pg/mL — ABNORMAL HIGH (ref 0.0–100.0)

## 2020-06-11 MED ORDER — SACUBITRIL-VALSARTAN 49-51 MG PO TABS
1.0000 | ORAL_TABLET | Freq: Two times a day (BID) | ORAL | Status: DC
  Administered 2020-06-11 – 2020-06-13 (×4): 1 via ORAL
  Filled 2020-06-11 (×6): qty 1

## 2020-06-11 MED ORDER — FUROSEMIDE 10 MG/ML IJ SOLN
40.0000 mg | Freq: Two times a day (BID) | INTRAMUSCULAR | Status: DC
  Administered 2020-06-11 – 2020-06-13 (×4): 40 mg via INTRAVENOUS
  Filled 2020-06-11 (×4): qty 4

## 2020-06-11 MED ORDER — HEPARIN BOLUS VIA INFUSION
4000.0000 [IU] | Freq: Once | INTRAVENOUS | Status: DC
  Filled 2020-06-11: qty 4000

## 2020-06-11 MED ORDER — NITROGLYCERIN 2 % TD OINT
1.0000 [in_us] | TOPICAL_OINTMENT | Freq: Once | TRANSDERMAL | Status: AC
  Administered 2020-06-11: 1 [in_us] via TOPICAL
  Filled 2020-06-11: qty 1

## 2020-06-11 MED ORDER — BASAGLAR KWIKPEN 100 UNIT/ML ~~LOC~~ SOPN: 19.0000 [IU] | PEN_INJECTOR | SUBCUTANEOUS | Status: DC

## 2020-06-11 MED ORDER — ONDANSETRON HCL 4 MG/2ML IJ SOLN: 4.0000 mg | Freq: Three times a day (TID) | INTRAMUSCULAR | Status: DC | PRN

## 2020-06-11 MED ORDER — ONDANSETRON HCL 4 MG/2ML IJ SOLN
4.0000 mg | Freq: Once | INTRAMUSCULAR | Status: AC
  Administered 2020-06-11: 4 mg via INTRAVENOUS
  Filled 2020-06-11: qty 2

## 2020-06-11 MED ORDER — IPRATROPIUM-ALBUTEROL 0.5-2.5 (3) MG/3ML IN SOLN
3.0000 mL | Freq: Four times a day (QID) | RESPIRATORY_TRACT | Status: DC
  Administered 2020-06-11 – 2020-06-14 (×6): 3 mL via RESPIRATORY_TRACT
  Filled 2020-06-11 (×9): qty 3

## 2020-06-11 MED ORDER — NITROGLYCERIN 0.4 MG SL SUBL
0.4000 mg | SUBLINGUAL_TABLET | SUBLINGUAL | Status: DC | PRN
  Filled 2020-06-11: qty 1

## 2020-06-11 MED ORDER — MORPHINE SULFATE (PF) 4 MG/ML IV SOLN
4.0000 mg | Freq: Once | INTRAVENOUS | Status: AC
  Administered 2020-06-11: 4 mg via INTRAVENOUS
  Filled 2020-06-11: qty 1

## 2020-06-11 MED ORDER — HEPARIN BOLUS VIA INFUSION
5000.0000 [IU] | Freq: Once | INTRAVENOUS | Status: AC
  Administered 2020-06-11: 5000 [IU] via INTRAVENOUS
  Filled 2020-06-11: qty 5000

## 2020-06-11 MED ORDER — ACETAMINOPHEN 325 MG PO TABS
650.0000 mg | ORAL_TABLET | Freq: Four times a day (QID) | ORAL | Status: DC | PRN
  Administered 2020-06-14: 650 mg via ORAL
  Filled 2020-06-11: qty 2

## 2020-06-11 MED ORDER — SPIRONOLACTONE 25 MG PO TABS
25.0000 mg | ORAL_TABLET | Freq: Two times a day (BID) | ORAL | Status: DC
  Administered 2020-06-11 – 2020-06-14 (×5): 25 mg via ORAL
  Filled 2020-06-11 (×5): qty 1

## 2020-06-11 MED ORDER — HEPARIN (PORCINE) 25000 UT/250ML-% IV SOLN
1200.0000 [IU]/h | INTRAVENOUS | Status: DC
  Filled 2020-06-11: qty 250

## 2020-06-11 MED ORDER — CARVEDILOL 25 MG PO TABS
25.0000 mg | ORAL_TABLET | Freq: Two times a day (BID) | ORAL | Status: DC
  Administered 2020-06-11 – 2020-06-12 (×3): 25 mg via ORAL
  Filled 2020-06-11 (×3): qty 1

## 2020-06-11 MED ORDER — FENOFIBRATE 160 MG PO TABS
160.0000 mg | ORAL_TABLET | Freq: Every day | ORAL | Status: DC
  Administered 2020-06-12: 160 mg via ORAL
  Filled 2020-06-11 (×3): qty 1

## 2020-06-11 MED ORDER — ASPIRIN EC 81 MG PO TBEC
81.0000 mg | DELAYED_RELEASE_TABLET | Freq: Every day | ORAL | Status: DC
  Administered 2020-06-11 – 2020-06-14 (×3): 81 mg via ORAL
  Filled 2020-06-11 (×3): qty 1

## 2020-06-11 MED ORDER — DM-GUAIFENESIN ER 30-600 MG PO TB12: 1.0000 | ORAL_TABLET | Freq: Two times a day (BID) | ORAL | Status: DC | PRN

## 2020-06-11 MED ORDER — INSULIN ASPART 100 UNIT/ML ~~LOC~~ SOLN
0.0000 [IU] | Freq: Every day | SUBCUTANEOUS | Status: DC
  Administered 2020-06-12: 2 [IU] via SUBCUTANEOUS
  Filled 2020-06-11: qty 1

## 2020-06-11 MED ORDER — INSULIN GLARGINE 100 UNIT/ML ~~LOC~~ SOLN
19.0000 [IU] | Freq: Every day | SUBCUTANEOUS | Status: DC
  Administered 2020-06-12 – 2020-06-14 (×2): 19 [IU] via SUBCUTANEOUS
  Filled 2020-06-11 (×4): qty 0.19

## 2020-06-11 MED ORDER — CLONIDINE HCL 0.1 MG PO TABS: 0.0500 mg | ORAL_TABLET | Freq: Two times a day (BID) | ORAL | Status: DC

## 2020-06-11 MED ORDER — ROSUVASTATIN CALCIUM 10 MG PO TABS
20.0000 mg | ORAL_TABLET | Freq: Every day | ORAL | Status: DC
  Administered 2020-06-11 – 2020-06-13 (×3): 20 mg via ORAL
  Filled 2020-06-11: qty 1
  Filled 2020-06-11: qty 2
  Filled 2020-06-11: qty 1
  Filled 2020-06-11: qty 2

## 2020-06-11 MED ORDER — INSULIN ASPART 100 UNIT/ML ~~LOC~~ SOLN
0.0000 [IU] | Freq: Three times a day (TID) | SUBCUTANEOUS | Status: DC
  Administered 2020-06-11 – 2020-06-12 (×2): 2 [IU] via SUBCUTANEOUS
  Administered 2020-06-12 – 2020-06-14 (×5): 3 [IU] via SUBCUTANEOUS
  Filled 2020-06-11 (×7): qty 1

## 2020-06-11 MED ORDER — TORSEMIDE 20 MG PO TABS: 40.0000 mg | ORAL_TABLET | Freq: Every day | ORAL | Status: DC

## 2020-06-11 MED ORDER — ALBUTEROL SULFATE HFA 108 (90 BASE) MCG/ACT IN AERS
2.0000 | INHALATION_SPRAY | RESPIRATORY_TRACT | Status: DC | PRN
  Filled 2020-06-11: qty 6.7

## 2020-06-11 MED ORDER — HYDRALAZINE HCL 20 MG/ML IJ SOLN: 5.0000 mg | INTRAMUSCULAR | Status: DC | PRN

## 2020-06-11 MED ORDER — HEPARIN (PORCINE) 25000 UT/250ML-% IV SOLN
2050.0000 [IU]/h | INTRAVENOUS | Status: DC
  Administered 2020-06-11 – 2020-06-12 (×3): 1800 [IU]/h via INTRAVENOUS
  Administered 2020-06-13: 1900 [IU]/h via INTRAVENOUS
  Filled 2020-06-11 (×3): qty 250

## 2020-06-11 MED ORDER — MORPHINE SULFATE (PF) 2 MG/ML IV SOLN
2.0000 mg | INTRAVENOUS | Status: DC | PRN
  Administered 2020-06-11 (×2): 2 mg via INTRAVENOUS
  Filled 2020-06-11 (×2): qty 1

## 2020-06-11 NOTE — Progress Notes (Signed)
ANTICOAGULATION CONSULT NOTE - Initial Consult  Pharmacy Consult for heparin infusion  Indication: chest pain/ACS/PE  Allergies  Allergen Reactions   Metformin And Related Nausea And Vomiting   Prednisone Other (See Comments)    Reaction: Hallucinations     Zantac [Ranitidine Hcl] Diarrhea and Nausea Only    Night sweats    Patient Measurements: Height: 6' 0.01" (182.9 cm) Weight: 116.9 kg (257 lb 11.2 oz) IBW/kg (Calculated) : 77.62 Heparin Dosing Weight: 102  Vital Signs: Temp: 97.6 F (36.4 C) (10/24 1607) Temp Source: Oral (10/24 1607) BP: 157/109 (10/24 1607) Pulse Rate: 75 (10/24 1607)  Labs: Recent Labs    06/09/20 0514 06/11/20 0707 06/11/20 0919 06/11/20 1625  HGB 14.4 16.6  --   --   HCT 43.7 51.5  --   --   PLT 175 219  --   --   APTT  --   --  30 80*  LABPROT  --   --  13.9  --   INR  --   --  1.1  --   HEPARINUNFRC  --   --  0.56  --   CREATININE 1.62* 1.44*  --   --   TROPONINIHS  --  760* 679*  --     Estimated Creatinine Clearance: 80.1 mL/min (A) (by C-G formula based on SCr of 1.44 mg/dL (H)).   Medical History: Past Medical History:  Diagnosis Date   Chest wall pain, chronic    Chronic systolic CHF (congestive heart failure) (HCC)    Chronic Troponin Elevation    CKD (chronic kidney disease), stage II-III    COPD (chronic obstructive pulmonary disease) ([place])    Diabetes mellitus without complication ([place])    Hypertension    Resolved since weight loss   NICM (nonischemic cardiomyopathy) ([place])    a. 03/2015 Echo: EF 45-50%; b. 12/2015 Echo: EF 20-25%; c. 02/2016 Echo: EF 30-35%; d. 11/2016 Echo: EF 40-45%; e. 06/2019 Echo: EF 30-35%; f. 11/2019 Echo: EF 25-30%, glob HK. Mild LVH. Gr2 DD. Mild-mod AI. BAE. Triv MR.   Non-obstructive CAD (coronary artery disease)    a. 04/2015 low risk MV;  b. 12/2016 Cath: minor irregs in LAD/Diag/LCX/OM, RCA 40p/m/d.   NSVT (nonsustained ventricular tachycardia) ([place])    a. 12/2015 noted on  tele-->amio;  b. 12/2015 Event monitor: no VT. Atach noted.   Obesity (BMI 30.0-34.9)    Psoriasis    Syncope    a. 01/2016 - felt to be vasovagal.   Tobacco abuse     Assessment: Pharmacy consulted for heparin infusion dosing and monitoring for 49 yo male admitted with ACS/STEMI. Patient reports ongoing chest pain over the past week. He has PMH of CHF, CKD, COPD, DM, HTN, and PE. He was recently discharged from the hospital on 10/22 after admission for PEs. Patient was started on Xarelto during his inpatient visit, but did not pick up his prescription from the pharmacy. Last dose of Xarelto 10/22.   Goal of Therapy:  Heparin level 0.3-0.7 units/ml aPTT 66-102 seconds Monitor platelets by anticoagulation protocol: Yes  10/24 - heparin 5000 units x 1, 1800 units/hr (based on previous visit) 10/24 @ 1625 APTT 80 - continue 1800 units/hr  Plan:  - Continue Heparin 1800 units/hr  - Will need to adjust heparin infusion based on aPTTs if initial HL is elevated.  - Check anti-Xa level 6 hours after infusion is started and daily while on heparin - Continue to monitor H&H and platelets  Lilia Pro B  Candis Schatz, PharmD Pharmacy Resident  06/11/2020 4:56 PM

## 2020-06-11 NOTE — ED Notes (Signed)
Advised nurse that patient has assigned bed 

## 2020-06-11 NOTE — H&P (Addendum)
History and Physical    Gabriel [name]. FIE:[id number] DOB: 08-Aug-1971 DOA: 06/11/2020  Referring MD/NP/PA:   PCP: Patient, No Pcp Per   Patient coming from:  The patient is coming from home.  At baseline, pt is independent for most of ADL.        Chief Complaint: chest pain  HPI: Gabriel [name]. is a 49 y.o. male with medical history significant of hx of CAD, HLD, HTN, DM, COPD, PE not taking his Xarelto, sCHF, CKD-III, NSVT, former smoker, presents with CP and SOB.   Patient states that he has been having intermittent chest pain for several weeks.  He has history of PE, but not compliance to taking Xarelto. He was recently hospitalized from 10/20-10/22. CTA showed small PE in right lower lobe.  Patient was discharged on Xarelto, but has not filled new prescription.  Today patient presents with chest pain.  His chest pain is located in front chest, constant, dull, 10 out of 10 severity, nonradiating. It is associated with shortness of breath and dry cough.  No fever or chills.  Patient has nausea, no vomiting or diarrhea.  Patient has some central abdominal discomfort which he attributes to abdominal swelling.  No symptoms of UTI or unilateral weakness.  ED Course: pt was found to have troponin VII 60, WBC 11.8, pending COVID-19 PCR, slightly worsened renal function, temperature normal, blood pressure 183/112, heart rate 84, RR 22, oxygen saturation 98% on room air, which improved to 99% on 2 L oxygen.  Chest x-ray showed interstitial prominence and cardiomegaly.  Patient is admitted to progressive bed as inpatient.  Cardiology, Dr. Percival Spanish is consulted.  Review of Systems:   General: no fevers, chills, no body weight gain. Has fatigue HEENT: no blurry vision, hearing changes or sore throat Respiratory: has dyspnea, coughing, no wheezing CV: has chest pain, no palpitations GI: has nausea, abdominal pain, no diarrhea, constipation, vomiting,  GU: no dysuria, burning on urination,  increased urinary frequency, hematuria  Ext: has trace leg edema Neuro: no unilateral weakness, numbness, or tingling, no vision change or hearing loss Skin: no rash, no skin tear. MSK: No muscle spasm, no deformity, no limitation of range of movement in spin Heme: No easy bruising.  Travel history: No recent long distant travel.  Allergy:  Allergies  Allergen Reactions  . Metformin And Related Nausea And Vomiting  . Prednisone Other (See Comments)    Reaction: Hallucinations    . Zantac [Ranitidine Hcl] Diarrhea and Nausea Only    Night sweats    Past Medical History:  Diagnosis Date  . Chest wall pain, chronic   . Chronic systolic CHF (congestive heart failure) ([place])   . Chronic Troponin Elevation   . CKD (chronic kidney disease), stage II-III   . COPD (chronic obstructive pulmonary disease) ([place])   . Diabetes mellitus without complication ([place])   . Hypertension    Resolved since weight loss  . NICM (nonischemic cardiomyopathy) ([place])    a. 03/2015 Echo: EF 45-50%; b. 12/2015 Echo: EF 20-25%; c. 02/2016 Echo: EF 30-35%; d. 11/2016 Echo: EF 40-45%; e. 06/2019 Echo: EF 30-35%; f. 11/2019 Echo: EF 25-30%, glob HK. Mild LVH. Gr2 DD. Mild-mod AI. BAE. Triv MR.  . Non-obstructive CAD (coronary artery disease)    a. 04/2015 low risk MV;  b. 12/2016 Cath: minor irregs in LAD/Diag/LCX/OM, RCA 40p/m/d.  [name] (nonsustained ventricular tachycardia) ([place])    a. 12/2015 noted on tele-->amio;  b. 12/2015 Event monitor: no VT.  Atach noted.  . Obesity (BMI 30.0-34.9)   . Psoriasis   . Syncope    a. 01/2016 - felt to be vasovagal.  . Tobacco abuse     Past Surgical History:  Procedure Laterality Date  . AMPUTATION    . CARDIAC CATHETERIZATION    . FINGER AMPUTATION     Traumatic  . FINGER FRACTURE SURGERY Left   . LEFT HEART CATH AND CORONARY ANGIOGRAPHY N/A 01/06/2017   Procedure: Left Heart Cath and Coronary Angiography;  Surgeon: Wellington Hampshire, MD;  Location: [facility];   Service: Cardiovascular;  Laterality: N/A;    Social History:  reports that he quit smoking about 4 weeks ago. His smoking use included cigarettes. He has a 16.50 pack-year smoking history. He has quit using smokeless tobacco. He reports current alcohol use. He reports that he does not use drugs.  Family History:  Family History  Problem Relation Age of Onset  . Diabetes Mellitus II Mother   . Hypothyroidism Mother   . Hypertension Mother   . Hypertension Father   . Gout Father   . Cancer Paternal Aunt   . Cancer Maternal Grandfather   . Diabetes Maternal Grandfather      Prior to Admission medications   Medication Sig Start Date End Date Taking? Authorizing Provider  blood glucose meter kit and supplies KIT Dispense based on patient and insurance preference. Use up to four times daily as directed. (FOR ICD-9 250.00, 250.01). 06/09/20   Lorella Nimrod, MD  carvedilol (COREG) 25 MG tablet Take 1 tablet (25 mg total) by mouth 2 (two) times daily with a meal. 06/09/20 07/09/20  Lorella Nimrod, MD  cloNIDine (CATAPRES) 0.1 MG tablet Take 0.5 tablets (0.05 mg total) by mouth 2 (two) times daily. 06/09/20   Lorella Nimrod, MD  fenofibrate (TRICOR) 145 MG tablet Take 1 tablet (145 mg total) by mouth daily. 06/09/20   Lorella Nimrod, MD  Insulin Glargine (BASAGLAR KWIKPEN) 100 UNIT/ML Inject 28 Units into the skin at bedtime. 06/09/20   Lorella Nimrod, MD  insulin lispro (HUMALOG) 100 UNIT/ML KwikPen Inject 3 Units into the skin 3 (three) times daily before meals. 06/09/20   Lorella Nimrod, MD  Insulin Pen Needle 29G X 5MM MISC 1 Dose by Does not apply route 4 (four) times daily -  with meals and at bedtime. 06/09/20   Lorella Nimrod, MD  Ipratropium-Albuterol (COMBIVENT) 20-100 MCG/ACT AERS respimat Inhale 1 puff into the lungs every 6 (six) hours. 06/09/20   Lorella Nimrod, MD  nitroGLYCERIN (NITROSTAT) 0.4 MG SL tablet Place 1 tablet (0.4 mg total) under the tongue every 5 (five) minutes x 3 doses as  needed for chest pain. 06/09/20   Lorella Nimrod, MD  RIVAROXABAN Alveda [employer]) VTE STARTER PACK (15 & 20 MG TABLETS) Follow package directions: Take one 69m tablet by mouth twice a day. On day 22, switch to one 240mtablet once a day. Take with food. 06/09/20   AmLorella NimrodMD  rosuvastatin (CRESTOR) 20 MG tablet Take 1 tablet (20 mg total) by mouth at bedtime. 06/09/20   AmLorella NimrodMD  sacubitril-valsartan (ENTRESTO) 49-51 MG Take 1 tablet by mouth 2 (two) times daily. 06/09/20   AmLorella NimrodMD  spironolactone (ALDACTONE) 25 MG tablet Take 1 tablet (25 mg total) by mouth 2 (two) times daily. 06/09/20 07/09/20  AmLorella NimrodMD  torsemide (DEMADEX) 20 MG tablet Take 2 tablets (40 mg total) by mouth daily. 06/09/20   AmLorella NimrodMD  Physical Exam: Vitals:   06/11/20 0652  BP: (!) 183/112  Pulse: 84  Resp: (!) 22  Temp: 98.3 F (36.8 C)  TempSrc: Oral  SpO2: 99%  Weight: 116.1 kg  Height: 6' (1.829 m)   General: Not in acute distress HEENT:       Eyes: PERRL, EOMI, no scleral icterus.       ENT: No discharge from the ears and nose, no pharynx injection, no tonsillar enlargement.        Neck: No JVD, no bruit, no mass felt. Heme: No neck lymph node enlargement. Cardiac: S1/S2, RRR, No murmurs, No gallops or rubs. Respiratory: No rales, wheezing, rhonchi or rubs. GI: Soft, mildly distended, nontender, no rebound pain, no organomegaly, BS present. GU: No hematuria Ext: has trace leg edema bilaterally. 2+DP/PT pulse bilaterally. Musculoskeletal: No joint deformities, No joint redness or warmth, no limitation of ROM in spin. Skin: No rashes.  Neuro: Alert, oriented X3, cranial nerves II-XII grossly intact, moves all extremities normally.  Psych: Patient is not psychotic, no suicidal or hemocidal ideation.  Labs on Admission: I have personally reviewed following labs and imaging studies  CBC: Recent Labs  Lab 06/07/20 1144 06/08/20 0420 06/09/20 0514 06/11/20 0707    WBC 9.4 8.5 7.0 11.8*  HGB 15.3 14.6 14.4 16.6  HCT 46.1 44.2 43.7 51.5  MCV 84.7 85.8 85.9 86.8  PLT 192 186 175 154   Basic Metabolic Panel: Recent Labs  Lab 06/07/20 1144 06/07/20 1831 06/08/20 0420 06/09/20 0514 06/11/20 0707  NA 137  --  137 138 138  K 4.3  --  4.0 3.8 4.7  CL 105  --  103 100 104  CO2 24  --  _0 GLUCOSE 222*  --  203* 169* 237*  BUN 21*  --  24* 32* 30*  CREATININE 1.27*  --  1.26* 1.62* 1.44*  CALCIUM 8.5*  --  8.7* 9.2 9.8  MG  --  1.8  --   --   --    GFR: Estimated Creatinine Clearance: 79.8 mL/min (A) (by C-G formula based on SCr of 1.44 mg/dL (H)). Liver Function Tests: Recent Labs  Lab 06/07/20 1831  AST 25  ALT 32  ALKPHOS 46  BILITOT 2.2*  PROT 6.3*  ALBUMIN 3.2*   Recent Labs  Lab 06/07/20 1831  LIPASE 26   No results for input(s): AMMONIA in the last 168 hours. Coagulation Profile: Recent Labs  Lab 06/07/20 1144 06/11/20 0919  INR 1.1 1.1   Cardiac Enzymes: No results for input(s): CKTOTAL, CKMB, CKMBINDEX, TROPONINI in the last 168 hours. BNP (last 3 results) No results for input(s): PROBNP in the last 8760 hours. HbA1C: No results for input(s): HGBA1C in the last 72 hours. CBG: Recent Labs  Lab 06/08/20 1141 06/08/20 1604 06/08/20 2028 06/09/20 0750 06/09/20 1146  GLUCAP 221* 175* 196* 190* 207*   Lipid Profile: No results for input(s): CHOL, HDL, LDLCALC, TRIG, CHOLHDL, LDLDIRECT in the last 72 hours. Thyroid Function Tests: No results for input(s): TSH, T4TOTAL, FREET4, T3FREE, THYROIDAB in the last 72 hours. Anemia Panel: No results for input(s): VITAMINB12, FOLATE, FERRITIN, TIBC, IRON, RETICCTPCT in the last 72 hours. Urine analysis:    Component Value Date/Time   COLORURINE STRAW (A) 01/09/2018 1659   APPEARANCEUR Clear 03/08/2020 0951   LABSPEC 1.026 01/09/2018 1659   LABSPEC 1.019 11/12/2012 1713   PHURINE 6.0 01/09/2018 1659   GLUCOSEU 3+ (A) 03/08/2020 0951   GLUCOSEU Negative  11/12/2012  [place] (A) 01/09/2018 1659   BILIRUBINUR Negative 03/08/2020 0951   BILIRUBINUR Negative 11/12/2012 1713   KETONESUR 5 (A) 01/09/2018 1659   PROTEINUR Negative 03/08/2020 0951   PROTEINUR 100 (A) 01/09/2018 1659   NITRITE Negative 03/08/2020 0951   NITRITE NEGATIVE 01/09/2018 1659   LEUKOCYTESUR Negative 03/08/2020 0951   LEUKOCYTESUR Negative 11/12/2012 1713   Sepsis Labs: _0 (procalcitonin:4,lacticidven:4) ) Recent Results (from the past 240 hour(s))  Respiratory Panel by RT PCR (Flu A&B, Covid) - Nasopharyngeal Swab     Status: None   Collection Time: 06/07/20  6:31 PM   Specimen: Nasopharyngeal Swab  Result Value Ref Range Status   SARS Coronavirus 2 by RT PCR NEGATIVE NEGATIVE Final    Comment: (NOTE) SARS-CoV-2 target nucleic acids are NOT DETECTED.  The SARS-CoV-2 RNA is generally detectable in upper respiratoy specimens during the acute phase of infection. The lowest concentration of SARS-CoV-2 viral copies this assay can detect is 131 copies/mL. A negative result does not preclude SARS-Cov-2 infection and should not be used as the sole basis for treatment or other patient management decisions. A negative result may occur with  improper specimen collection/handling, submission of specimen other than nasopharyngeal swab, presence of viral mutation(s) within the areas targeted by this assay, and inadequate number of viral copies (<131 copies/mL). A negative result must be combined with clinical observations, patient history, and epidemiological information. The expected result is Negative.  Fact Sheet for Patients:  PinkCheek.be  Fact Sheet for Healthcare Providers:  GravelBags.it  This test is no t yet approved or cleared by the [place] FDA and  has been authorized for detection and/or diagnosis of SARS-CoV-2 by FDA under an Emergency Use Authorization (EUA). This EUA will  remain  in effect (meaning this test can be used) for the duration of the COVID-19 declaration under Section 564(b)(1) of the Act, 21 U.S.C. section 360bbb-3(b)(1), unless the authorization is terminated or revoked sooner.     Influenza A by PCR NEGATIVE NEGATIVE Final   Influenza B by PCR NEGATIVE NEGATIVE Final    Comment: (NOTE) The Xpert Xpress SARS-CoV-2/FLU/RSV assay is intended as an aid in  the diagnosis of influenza from Nasopharyngeal swab specimens and  should not be used as a sole basis for treatment. Nasal washings and  aspirates are unacceptable for Xpert Xpress SARS-CoV-2/FLU/RSV  testing.  Fact Sheet for Patients: PinkCheek.be  Fact Sheet for Healthcare Providers: GravelBags.it  This test is not yet approved or cleared by the [place] FDA and  has been authorized for detection and/or diagnosis of SARS-CoV-2 by  FDA under an Emergency Use Authorization (EUA). This EUA will remain  in effect (meaning this test can be used) for the duration of the  Covid-19 declaration under Section 564(b)(1) of the Act, 21  U.S.C. section 360bbb-3(b)(1), unless the authorization is  terminated or revoked. Performed at [facility], [address], [place], [place] [address]      Radiological Exams on Admission: DG Chest 2 View  Result Date: 06/11/2020 CLINICAL DATA:  Chest pain EXAM: CHEST - 2 VIEW COMPARISON:  Chest x-rays dated 06/07/2020, 05/12/2020 and 12/31/2019. FINDINGS: Stable cardiomegaly. Chronic scarring/atelectasis within the LEFT mid lung region. Stable mild interstitial prominence bilaterally. Lungs are otherwise clear. No pleural effusion or pneumothorax is seen. No acute appearing osseous abnormality. Mild degenerative spurring in the mid/lower thoracic spine. IMPRESSION: 1. Stable interstitial prominence bilaterally, suggesting chronic mild CHF/volume overload. 2. No evidence of superimposed  pneumonia. 3. Stable  cardiomegaly. Electronically Signed   By: Franki Cabot M.D.   On: 06/11/2020 07:47     EKG: Reviewed personally, sinus rhythm, QTC 450, mild ST depression in lateral leads and inferior leads, right axis deviation   Assessment/Plan Principal Problem:   NSTEMI (non-ST elevated myocardial infarction) ([place]) Active Problems:   Essential hypertension   Hyperlipidemia   Chronic systolic CHF (congestive heart failure) ([facility])   Coronary artery disease, non-occlusive   CKD (chronic kidney disease), stage IIIa   Type II diabetes mellitus with renal manifestations ([facility])   COPD (chronic obstructive pulmonary disease) ([facility])   Leukocytosis   PE (pulmonary thromboembolism) ([facility])   NSTEMI and hx of Coronary artery disease, non-occlusive: trop 760. Dr. Percival Spanish for cardiology is consulted  - admit to progressive unit as inpatient - IV Heparin is stared in ED  - Trend Trop - Repeat EKG in the am  - prn Nitroglycerin, Morphine - Apirin, Crestor - Risk factor stratification: will check FLP (A1C 9.3 on 06/07/2020) - check UDS  Essential hypertension: IV hydralazine as needed -Continue Entresto, Coreg -will not restart clonidine per Dr. Percival Spanish  Hyperlipidemia -Crestor  CKD-IIIa: Recent baseline creatinine 1.26 on 10/21/202.  His creatinine is 1.44, BUN 30, slightly worsening than baseline. -Follow-up renal function with a BMP  Chronic systolic CHF (congestive heart failure) ([place]): 2D echo on 06/08/2020 showed EF of 35-40%.  Has trace leg edema, chest x-ray showed interstitial prominence, BNP is elevated -Continue home spironolactone -IV lasix 40 mg bid per Dr. Percival Spanish  Type II diabetes mellitus with renal manifestations [facility]): Recent A1c 9.3, poorly controlled -SSI -Decreased glargine insulin dose from 28 to 19 units daily  COPD (chronic obstructive pulmonary disease) ([place]): No wheezing on auscultation -Continue bronchodilators  Leukocytosis: Mild leukocytosis  with WBC 11.8, but no fever.  No source of infection, likely reactive. -Follow-up by CBC  PE (pulmonary thromboembolism) ([place]): Not compliance to Xarelto -Currently on IV heparin    DVT ppx: on IV Heparin       Code Status: Full code Family Communication: not done, no family member is at bed side.   Disposition Plan:  Anticipate discharge back to previous environment Consults called: Dr. Percival Spanish of cardiology Admission status: progressive unit as inpt       Status is: Inpatient  Remains inpatient appropriate because:Inpatient level of care appropriate due to severity of illness.  Patient has multiple comorbidities, including PE noncompliance to Xarelto, who presents with non-STEMI.  Troponin is up to 760.  His presentation is highly complicated.  Patient is at high risk of deterioration.  Patient needs to be treated in hospital for at least 2 days.  Dispo: The patient is from: Home              Anticipated d/c is to: Home              Anticipated d/c date is: 2 days              Patient currently is not medically stable to d/c.           Date of Service 06/11/2020    [name] [facility]   If 7PM-7AM, please contact night-coverage [email or web address] 06/11/2020, 10:24 AM

## 2020-06-11 NOTE — Progress Notes (Signed)
ANTICOAGULATION CONSULT NOTE - Initial Consult  Pharmacy Consult for heparin infusion  Indication: chest pain/ACS/PE  Allergies  Allergen Reactions  . Metformin And Related Nausea And Vomiting  . Prednisone Other (See Comments)    Reaction: Hallucinations    . Zantac [Ranitidine Hcl] Diarrhea and Nausea Only    Night sweats    Patient Measurements: Height: 6' (182.9 cm) Weight: 116.1 kg (256 lb) IBW/kg (Calculated) : 77.6 Heparin Dosing Weight: 102  Vital Signs: Temp: 98.3 F (36.8 C) (10/24 0652) Temp Source: Oral (10/24 0652) BP: 183/112 (10/24 0652) Pulse Rate: 84 (10/24 0652)  Labs: Recent Labs    06/09/20 0514 06/11/20 0707  HGB 14.4 16.6  HCT 43.7 51.5  PLT 175 219  CREATININE 1.62* 1.44*  TROPONINIHS  --  760*    Estimated Creatinine Clearance: 79.8 mL/min (A) (by C-G formula based on SCr of 1.44 mg/dL (H)).   Medical History: Past Medical History:  Diagnosis Date  . Chest wall pain, chronic   . Chronic systolic CHF (congestive heart failure) ([place])    a. 03/2015 Echo: EF 45-50%; b. 12/2015 Echo: EF 20-25%; c. 02/2016 Echo: EF 30-35%; d. 11/2016 Echo: EF 40-45%; e. 06/2019 Echo: EF 30-35%; f. 4/42021 Echo: EF 25-30%.  . Chronic Troponin Elevation   . CKD (chronic kidney disease), stage II-III   . COPD (chronic obstructive pulmonary disease) ([place])   . Diabetes mellitus without complication ([place])   . Hypertension    Resolved since weight loss  . NICM (nonischemic cardiomyopathy) ([place])    a. 03/2015 Echo: EF 45-50%; b. 12/2015 Echo: EF 20-25%; c. 02/2016 Echo: EF 30-35%; d. 11/2016 Echo: EF 40-45%; e. 06/2019 Echo: EF 30-35%; f. 11/2019 Echo: EF 25-30%, glob HK. Mild LVH. Gr2 DD. Mild-mod AI. BAE. Triv MR.  . Non-obstructive CAD (coronary artery disease)    a. 04/2015 low risk MV;  b. 12/2016 Cath: minor irregs in LAD/Diag/LCX/OM, RCA 40p/m/d.  [name] (nonsustained ventricular tachycardia) ([place])    a. 12/2015 noted on tele-->amio;  b. 12/2015 Event monitor: no VT.  Atach noted.  . Obesity (BMI 30.0-34.9)   . Psoriasis   . Syncope    a. 01/2016 - felt to be vasovagal.  . Tobacco abuse     Assessment: Pharmacy consulted for heparin infusion dosing and monitoring for 49 yo male admitted with ACS/STEMI. Patient reports ongoing chest pain over the past week. He has PMH of CHF, CKD, COPD, DM, HTN, and PE. He was recently discharged from the hospital on 10/22 after admission for PEs. Patient was started on Xarelto during his inpatient visit, but did not pick up his prescription from the pharmacy. Last dose of Xarelto 10/22.   Goal of Therapy:  Heparin level 0.3-0.7 units/ml aPTT 66-102 seconds Monitor platelets by anticoagulation protocol: Yes   Plan:  Give 5000 units bolus x 1 Will start Heparin 1800 units/hr based on Heparin infusion rate from last visit.  Will need to adjust heparin infusion based on aPTTs if initial HL is elevated.  Check anti-Xa level 6 hours after infusion is started and daily while on heparin Continue to monitor H&H and platelets  Pernell Dupre, PharmD, BCPS Clinical Pharmacist 06/11/2020 9:38 AM

## 2020-06-11 NOTE — Consult Note (Addendum)
Cardiology Consultation:   Patient ID: Gabriel [name]. MRN: [id number]; DOB: 03-08-1971  Admit date: 06/11/2020 Date of Consult: 06/11/2020  Primary Care Provider: Patient, No Pcp Per [facility] Cardiologist: [name], MD  [place] Electrophysiologist:  None    Patient Profile:   Gabriel [name]. is a 49 y.o. male with a hx of nonischemic cardiomyopathy who is being seen today for the evaluation of chest pain with an elevated troponin at the request of Dr. Blaine Hamper.  History of Present Illness:   Gabriel [name] presented with SOB.   He has a history of nonischemic cardiomyopathy.  Cath in March 2018 demonstrated non obstructive CAD (40% RCA stensos).  Most recent echo demonstrated an EF of 25 30% with diffuse hypokinesis.  He has had recent DVT/PE. He was in the hospital and discharged 10/22.  He had chest pain at that time.  He has had a mildly elevated hsTrop at baseline.  However, currently his trop is increased above his baseline with a slight trend down since admission.  (760, 679).  He reports that he did not feel any better at discharge.  He reports that he was as SOB on discharge as he had been on admission.  He did not weigh himself at home at discharge.  He picked up his meds yesterday but did not take anything.  He says he already had his Xarelto and has been taking that but apparently not any of the other meds.  He called 911 because he had progressive dyspnea.  He has chronic orthopnea.  He reports that he gets SOB and has to rest at the top of the stairs in his home.   He has had cough non productive.  He has had constant chest pain x 2 months like an elephant on his chest.  This does not wax or wane.  He cannot find anything to relieve this.  He has not had fevers/chills.  He does try to watch salt and fluid intake.     Past Medical History:  Diagnosis Date  . Chest wall pain, chronic   . Chronic systolic CHF (congestive heart failure) ([place])    a. 03/2015 Echo: EF  45-50%; b. 12/2015 Echo: EF 20-25%; c. 02/2016 Echo: EF 30-35%; d. 11/2016 Echo: EF 40-45%; e. 06/2019 Echo: EF 30-35%; f. 4/42021 Echo: EF 25-30%.  . Chronic Troponin Elevation   . CKD (chronic kidney disease), stage II-III   . COPD (chronic obstructive pulmonary disease) ([place])   . Diabetes mellitus without complication ([place])   . Hypertension    Resolved since weight loss  . NICM (nonischemic cardiomyopathy) ([place])    a. 03/2015 Echo: EF 45-50%; b. 12/2015 Echo: EF 20-25%; c. 02/2016 Echo: EF 30-35%; d. 11/2016 Echo: EF 40-45%; e. 06/2019 Echo: EF 30-35%; f. 11/2019 Echo: EF 25-30%, glob HK. Mild LVH. Gr2 DD. Mild-mod AI. BAE. Triv MR.  . Non-obstructive CAD (coronary artery disease)    a. 04/2015 low risk MV;  b. 12/2016 Cath: minor irregs in LAD/Diag/LCX/OM, RCA 40p/m/d.  [name] (nonsustained ventricular tachycardia) ([place])    a. 12/2015 noted on tele-->amio;  b. 12/2015 Event monitor: no VT. Atach noted.  . Obesity (BMI 30.0-34.9)   . Psoriasis   . Syncope    a. 01/2016 - felt to be vasovagal.  . Tobacco abuse     Past Surgical History:  Procedure Laterality Date  . AMPUTATION    . CARDIAC CATHETERIZATION    . FINGER AMPUTATION  Traumatic  . FINGER FRACTURE SURGERY Left   . LEFT HEART CATH AND CORONARY ANGIOGRAPHY N/A 01/06/2017   Procedure: Left Heart Cath and Coronary Angiography;  Surgeon: Wellington Hampshire, MD;  Location: [facility];  Service: Cardiovascular;  Laterality: N/A;     Home Medications:  Prior to Admission medications   Medication Sig Start Date End Date Taking? Authorizing Provider  blood glucose meter kit and supplies KIT Dispense based on patient and insurance preference. Use up to four times daily as directed. (FOR ICD-9 250.00, 250.01). 06/09/20  Yes Lorella Nimrod, MD  carvedilol (COREG) 25 MG tablet Take 1 tablet (25 mg total) by mouth 2 (two) times daily with a meal. 06/09/20 07/09/20 Yes Lorella Nimrod, MD  cloNIDine (CATAPRES) 0.1 MG tablet Take 0.5  tablets (0.05 mg total) by mouth 2 (two) times daily. 06/09/20  Yes Lorella Nimrod, MD  fenofibrate (TRICOR) 145 MG tablet Take 1 tablet (145 mg total) by mouth daily. 06/09/20  Yes Lorella Nimrod, MD  Insulin Glargine (BASAGLAR KWIKPEN) 100 UNIT/ML Inject 28 Units into the skin at bedtime. 06/09/20  Yes Lorella Nimrod, MD  insulin lispro (HUMALOG) 100 UNIT/ML KwikPen Inject 3 Units into the skin 3 (three) times daily before meals. 06/09/20  Yes Lorella Nimrod, MD  Insulin Pen Needle 29G X 5MM MISC 1 Dose by Does not apply route 4 (four) times daily -  with meals and at bedtime. 06/09/20  Yes Lorella Nimrod, MD  Ipratropium-Albuterol (COMBIVENT) 20-100 MCG/ACT AERS respimat Inhale 1 puff into the lungs every 6 (six) hours. 06/09/20  Yes Lorella Nimrod, MD  nitroGLYCERIN (NITROSTAT) 0.4 MG SL tablet Place 1 tablet (0.4 mg total) under the tongue every 5 (five) minutes x 3 doses as needed for chest pain. 06/09/20  Yes Lorella Nimrod, MD  RIVAROXABAN Alveda Reasons) VTE STARTER PACK (15 & 20 MG TABLETS) Follow package directions: Take one 28m tablet by mouth twice a day. On day 22, switch to one 229mtablet once a day. Take with food. 06/09/20  Yes AmLorella NimrodMD  rosuvastatin (CRESTOR) 20 MG tablet Take 1 tablet (20 mg total) by mouth at bedtime. 06/09/20  Yes AmLorella NimrodMD  sacubitril-valsartan (ENTRESTO) 49-51 MG Take 1 tablet by mouth 2 (two) times daily. 06/09/20  Yes AmLorella NimrodMD  spironolactone (ALDACTONE) 25 MG tablet Take 1 tablet (25 mg total) by mouth 2 (two) times daily. 06/09/20 07/09/20 Yes AmLorella NimrodMD  torsemide (DEMADEX) 20 MG tablet Take 2 tablets (40 mg total) by mouth daily. 06/09/20  Yes AmLorella NimrodMD    Inpatient Medications: Scheduled Meds: . aspirin EC  81 mg Oral Daily  . insulin aspart  0-5 Units Subcutaneous QHS  . insulin aspart  0-9 Units Subcutaneous TID WC   Continuous Infusions: . heparin 1,800 Units/hr (06/11/20 0952)   PRN Meds: acetaminophen,  albuterol, dextromethorphan-guaiFENesin, hydrALAZINE, morphine injection, nitroGLYCERIN, ondansetron (ZOFRAN) IV  Allergies:    Allergies  Allergen Reactions  . Metformin And Related Nausea And Vomiting  . Prednisone Other (See Comments)    Reaction: Hallucinations    . Zantac [Ranitidine Hcl] Diarrhea and Nausea Only    Night sweats    Social History:   Social History   Socioeconomic History  . Marital status: Widowed    Spouse name: Not on file  . Number of children: 1  . Years of education: 1239. Highest education level: 12th grade  Occupational History  . Occupation: disabled  Tobacco Use  . Smoking status:  Former Smoker    Packs/day: 0.50    Years: 33.00    Pack years: 16.50    Types: Cigarettes    Quit date: 05/08/2020    Years since quitting: 0.0  . Smokeless tobacco: Former Network engineer  . Vaping Use: Former  Substance and Sexual Activity  . Alcohol use: Yes    Alcohol/week: 0.0 standard drinks    Comment: occassionally  . Drug use: No  . Sexual activity: Not Currently  Other Topics Concern  . Not on file  Social History Narrative      Social Determinants of Health   Financial Resource Strain:   . Difficulty of Paying Living Expenses: Not on file  Food Insecurity:   . Worried About Charity fundraiser in the Last Year: Not on file  . Ran Out of Food in the Last Year: Not on file  Transportation Needs:   . Lack of Transportation (Medical): Not on file  . Lack of Transportation (Non-Medical): Not on file  Physical Activity:   . Days of Exercise per Week: Not on file  . Minutes of Exercise per Session: Not on file  Stress:   . Feeling of Stress : Not on file  Social Connections:   . Frequency of Communication with Friends and Family: Not on file  . Frequency of Social Gatherings with Friends and Family: Not on file  . Attends Religious Services: Not on file  . Active Member of Clubs or Organizations: Not on file  . Attends Archivist  Meetings: Not on file  . Marital Status: Not on file  Intimate Partner Violence:   . Fear of Current or Ex-Partner: Not on file  . Emotionally Abused: Not on file  . Physically Abused: Not on file  . Sexually Abused: Not on file    Family History:    Family History  Problem Relation Age of Onset  . Diabetes Mellitus II Mother   . Hypothyroidism Mother   . Hypertension Mother   . Hypertension Father   . Gout Father   . Cancer Paternal Aunt   . Cancer Maternal Grandfather   . Diabetes Maternal Grandfather      ROS:  Please see the history of present illness.  All other ROS reviewed and negative.     Physical Exam/Data:   Vitals:   06/11/20 0652  BP: (!) 183/112  Pulse: 84  Resp: (!) 22  Temp: 98.3 F (36.8 C)  TempSrc: Oral  SpO2: 99%  Weight: 116.1 kg  Height: 6' (1.829 m)   No intake or output data in the 24 hours ending 06/11/20 0955 Last 3 Weights 06/11/2020 06/09/2020 06/08/2020  Weight (lbs) 256 lb 257 lb 11.2 oz 256 lb 11.2 oz  Weight (kg) 116.121 kg 116.892 kg 116.438 kg     Body mass index is 34.72 kg/m.  GENERAL:  Well appearing HEENT:  Pupils equal round and reactive, fundi not visualized, oral mucosa unremarkable NECK:  No jugular venous distention, waveform within normal limits, carotid upstroke brisk and symmetric, no bruits, no thyromegaly LYMPHATICS:  No cervical, inguinal adenopathy LUNGS:  Clear to auscultation bilaterally BACK:  No CVA tenderness CHEST:  Unremarkable HEART:  PMI not displaced or sustained,S1 and S2 within normal limits, no S3, no S4, no clicks, no rubs, no murmurs ABD:  Flat, positive bowel sounds normal in frequency in pitch, no bruits, no rebound, no guarding, no midline pulsatile mass, no hepatomegaly, no splenomegaly EXT:  2 plus pulses  throughout, no edema, no cyanosis no clubbing SKIN:  No rashes no nodules NEURO:  Cranial nerves II through XII grossly intact, motor grossly intact throughout PSYCH:  Cognitively intact,  oriented to person place and time   EKG:  The EKG was personally reviewed and demonstrates:  NSR, rate 75, RAD, non specific inferior and lateral ST depression unchanged from previous.  Telemetry:  Telemetry was personally reviewed and demonstrates:  NSR, NSVT short runs  Relevant CV Studies: 1. Left ventricular ejection fraction, by estimation, is 35 to 40%. The  left ventricle has moderately decreased function. The left ventricle  demonstrates global hypokinesis. The left ventricular internal cavity size  was moderately dilated. Left  ventricular diastolic parameters are indeterminate.  2. Right ventricular systolic function is normal. The right ventricular  size is normal. There is mildly elevated pulmonary artery systolic  pressure. The estimated right ventricular systolic pressure is 78.5 mmHg.  3. Left atrial size was moderately dilated.  4. Mild to moderate mitral valve regurgitation.   Laboratory Data:  High Sensitivity Troponin:   Recent Labs  Lab 05/13/20 0239 06/07/20 1155 06/07/20 1714 06/08/20 0857 06/11/20 0707  TROPONINIHS 73* 55* 59* 46* 760*     Chemistry Recent Labs  Lab 06/08/20 0420 06/09/20 0514 06/11/20 0707  NA 137 138 138  K 4.0 3.8 4.7  CL 103 100 104  CO2 _0 GLUCOSE 203* 169* 237*  BUN 24* 32* 30*  CREATININE 1.26* 1.62* 1.44*  CALCIUM 8.7* 9.2 9.8  GFRNONAA >60 51* 59*  ANIONGAP _1 Recent Labs  Lab 06/07/20 1831  PROT 6.3*  ALBUMIN 3.2*  AST 25  ALT 32  ALKPHOS 46  BILITOT 2.2*   Hematology Recent Labs  Lab 06/08/20 0420 06/09/20 0514 06/11/20 0707  WBC 8.5 7.0 11.8*  RBC 5.15 5.09 5.93*  HGB 14.6 14.4 16.6  HCT 44.2 43.7 51.5  MCV 85.8 85.9 86.8  MCH 28.3 28.3 28.0  MCHC 33.0 33.0 32.2  RDW 14.0 14.1 14.0  PLT 186 175 219   BNP Recent Labs  Lab 06/07/20 1144  BNP 1,566.8*    DDimer No results for input(s): DDIMER in the last 168 hours.   Radiology/Studies:  DG Chest 2 View  Result  Date: 06/11/2020 CLINICAL DATA:  Chest pain EXAM: CHEST - 2 VIEW COMPARISON:  Chest x-rays dated 06/07/2020, 05/12/2020 and 12/31/2019. FINDINGS: Stable cardiomegaly. Chronic scarring/atelectasis within the LEFT mid lung region. Stable mild interstitial prominence bilaterally. Lungs are otherwise clear. No pleural effusion or pneumothorax is seen. No acute appearing osseous abnormality. Mild degenerative spurring in the mid/lower thoracic spine. IMPRESSION: 1. Stable interstitial prominence bilaterally, suggesting chronic mild CHF/volume overload. 2. No evidence of superimposed pneumonia. 3. Stable cardiomegaly. Electronically Signed   By: Franki Cabot M.D.   On: 06/11/2020 07:47   DG Chest 2 View  Result Date: 06/07/2020 CLINICAL DATA:  Chest pain and shortness of breath EXAM: CHEST - 2 VIEW COMPARISON:  Chest radiograph and chest CT May 12, 2020 FINDINGS: There is cardiomegaly with pulmonary vascular congestion. There are small joint effusions bilaterally with a degree of interstitial pulmonary edema. There is mild left midlung atelectasis. No consolidation. No adenopathy. No bone lesions. No pneumothorax. IMPRESSION: Cardiomegaly with pulmonary vascular congestion. There is a degree of interstitial pulmonary edema with small pleural effusions. The overall appearance is felt to be indicative of a degree of congestive heart failure. No consolidation. Mild atelectasis left mid lung noted. Electronically Signed  By: Lowella Grip III M.D.   On: 06/07/2020 12:13   CT Angio Chest PE W and/or Wo Contrast  Result Date: 06/07/2020 CLINICAL DATA:  Chest pain, shortness of breath EXAM: CT ANGIOGRAPHY CHEST WITH CONTRAST TECHNIQUE: Multidetector CT imaging of the chest was performed using the standard protocol during bolus administration of intravenous contrast. Multiplanar CT image reconstructions and MIPs were obtained to evaluate the vascular anatomy. CONTRAST:  33m OMNIPAQUE IOHEXOL 350 MG/ML SOLN  COMPARISON:  05/12/2020 FINDINGS: Cardiovascular: Filling defects in the lower lobe pulmonary arteries in the right lower lobe compatible with pulmonary emboli. No evidence of pulmonary emboli on the left. Scattered aortic and coronary artery calcifications. Cardiomegaly. Aorta normal caliber. Mediastinum/Nodes: Mildly enlarged mediastinal lymph nodes. No hilar or axillary adenopathy. Mediastinal lymph nodes are stable since prior study. Lungs/Pleura: Small bilateral pleural effusions, slightly increased since prior study. Mild ground-glass opacities in the mid and lower lungs, likely mild edema. Vascular congestion. Right basilar compressive atelectasis. Upper Abdomen: Imaging into the upper abdomen demonstrates no acute findings. Musculoskeletal: Chest wall soft tissues are unremarkable. No acute bony abnormality. Review of the MIP images confirms the above findings. IMPRESSION: Filling defects within right lower lobe pulmonary arteries compatible with small pulmonary emboli. Cardiomegaly, vascular congestion and mild/early edema. Small bilateral pleural effusions, increasing since prior study. Mediastinal adenopathy, similar to prior study. Coronary artery disease. Aortic Atherosclerosis (ICD10-I70.0). Critical Value/emergent results were called by telephone at the time of interpretation on 06/07/2020 at 6:26 pm [facility], who verbally acknowledged these results. Electronically Signed   By: KRolm BaptiseM.D.   On: 06/07/2020 18:27   ECHOCARDIOGRAM COMPLETE  Result Date: 06/08/2020    ECHOCARDIOGRAM REPORT   Patient Name:   Gabriel [name] Date of Exam: 06/08/2020 Medical Rec #:  [id number]          Height:       72.0 in Accession #:    [id number]         Weight:       262.0 lb Date of Birth:  6Jan 04, 1972          BSA:          2.390 m Patient Age:    89years            BP:           138/89 mmHg Patient Gender: M                   HR:           75 bpm. Exam Location:  [facility] Procedure: 2D  Echo, Color Doppler, Cardiac Doppler and Intracardiac            Opacification Agent Indications:     II43.32CHF-Acute Systolic  History:         Patient has prior history of Echocardiogram examinations, most                  recent 11/23/2019. NICM, CAD, COPD and CKD; Risk Factors:Current                  Smoker, Hypertension and Diabetes.  Sonographer:     JCharmayne SheerRDCS (AE) Referring Phys:  [id number] MasseDiagnosing Phys: TIda RogueMD  Sonographer Comments: Suboptimal apical window and no subcostal window. Image acquisition challenging due to patient body habitus and Image acquisition challenging due to COPD. IMPRESSIONS  1. Left ventricular ejection fraction,  by estimation, is 35 to 40%. The left ventricle has moderately decreased function. The left ventricle demonstrates global hypokinesis. The left ventricular internal cavity size was moderately dilated. Left ventricular diastolic parameters are indeterminate.  2. Right ventricular systolic function is normal. The right ventricular size is normal. There is mildly elevated pulmonary artery systolic pressure. The estimated right ventricular systolic pressure is 58.5 mmHg.  3. Left atrial size was moderately dilated.  4. Mild to moderate mitral valve regurgitation. FINDINGS  Left Ventricle: Left ventricular ejection fraction, by estimation, is 35 to 40%. The left ventricle has moderately decreased function. The left ventricle demonstrates global hypokinesis. Definity contrast agent was given IV to delineate the left ventricular endocardial borders. The left ventricular internal cavity size was moderately dilated. There is no left ventricular hypertrophy. Left ventricular diastolic parameters are indeterminate. Right Ventricle: The right ventricular size is normal. No increase in right ventricular wall thickness. Right ventricular systolic function is normal. There is mildly elevated pulmonary artery systolic pressure. The tricuspid regurgitant  velocity is 3.15  m/s, and with an assumed right atrial pressure of 5 mmHg, the estimated right ventricular systolic pressure is 27.7 mmHg. Left Atrium: Left atrial size was moderately dilated. Right Atrium: Right atrial size was normal in size. Pericardium: There is no evidence of pericardial effusion. Mitral Valve: The mitral valve is normal in structure. Mild to moderate mitral valve regurgitation. No evidence of mitral valve stenosis. MV peak gradient, 5.3 mmHg. The mean mitral valve gradient is 2.0 mmHg. Tricuspid Valve: The tricuspid valve is normal in structure. Tricuspid valve regurgitation is mild . No evidence of tricuspid stenosis. Aortic Valve: The aortic valve is normal in structure. Aortic valve regurgitation is mild. No aortic stenosis is present. Aortic valve mean gradient measures 3.0 mmHg. Aortic valve peak gradient measures 6.1 mmHg. Aortic valve area, by VTI measures 3.86 cm. Pulmonic Valve: The pulmonic valve was normal in structure. Pulmonic valve regurgitation is not visualized. No evidence of pulmonic stenosis. Aorta: The aortic root is normal in size and structure. Venous: The inferior vena cava is normal in size with greater than 50% respiratory variability, suggesting right atrial pressure of 3 mmHg. IAS/Shunts: No atrial level shunt detected by color flow Doppler.  LEFT VENTRICLE PLAX 2D LVIDd:         6.18 cm LVIDs:         5.35 cm LV PW:         1.30 cm LV IVS:        1.03 cm LVOT diam:     2.70 cm LV SV:         72 LV SV Index:   30 LVOT Area:     5.73 cm  LV Volumes (MOD) LV vol d, MOD A2C: 120.0 ml LV vol d, MOD A4C: 189.0 ml LV vol s, MOD A2C: 62.2 ml LV vol s, MOD A4C: 87.2 ml LV SV MOD A2C:     57.8 ml LV SV MOD A4C:     189.0 ml LV SV MOD BP:      77.4 ml RIGHT VENTRICLE RV Basal diam:  4.35 cm LEFT ATRIUM           Index       RIGHT ATRIUM           Index LA diam:      5.50 cm 2.30 cm/m  RA Area:     22.90 cm LA Vol (A4C): 93.3 ml 39.04 ml/m RA Volume:   67.70 ml  28.33  ml/m  AORTIC VALVE                   PULMONIC VALVE AV Area (Vmax):    3.74 cm    PV Vmax:       0.81 m/s AV Area (Vmean):   3.69 cm    PV Vmean:      52.300 cm/s AV Area (VTI):     3.86 cm    PV VTI:        0.124 m AV Vmax:           123.00 cm/s PV Peak grad:  2.6 mmHg AV Vmean:          83.700 cm/s PV Mean grad:  1.0 mmHg AV VTI:            0.187 m AV Peak Grad:      6.1 mmHg AV Mean Grad:      3.0 mmHg LVOT Vmax:         80.30 cm/s LVOT Vmean:        54.000 cm/s LVOT VTI:          0.126 m LVOT/AV VTI ratio: 0.67  AORTA Ao Root diam: 3.70 cm MITRAL VALVE                TRICUSPID VALVE MV Area (PHT): 5.09 cm     TR Peak grad:   39.7 mmHg MV Peak grad:  5.3 mmHg     TR Vmax:        315.00 cm/s MV Mean grad:  2.0 mmHg MV Vmax:       1.15 m/s     SHUNTS MV Vmean:      69.5 cm/s    Systemic VTI:  0.13 m MV Decel Time: 149 msec     Systemic Diam: 2.70 cm MV E velocity: 102.00 cm/s MV A velocity: 52.70 cm/s MV E/A ratio:  1.94 [name] MD Electronically signed by [name] MD Signature Date/Time: 06/08/2020/5:02:43 PM    Final      Assessment and Plan:   ELEVATED TROPONIN:   I suspect that this is likely demand ischemia.  It is increased compared to previous.  However, EKG and history does not suggest a new acute coronary syndrome.  At this point I think we should manage his acute on chronic HF and trend tomorrow his hsTrop.  I am not anticipating an ischemia work up although, we could consider perfusion study (perhaps as an out patient) once his volume status is improved  DILATED CARDIOMYOPATHY:     He was [facility].   He is being managed medically with plans for possible ICD. However, he did not start or take his meds after discharge.  I think that this is the biggest issue.  We will need to restart these.  I would use IV Lasix 40 mg bid (might need higher dose depending on output).  We will need to watch his creat.  Restart Entresto, spironolactone, Coreg at previous dose.   I don't think we need to start clonidine yet.    PE/DVT:  Continue anticoagulation.  He does report that he has been taking his Xarelto.   OSA:  He thought he was going to have sleep study.      HTN:  This is being managed in the context of treating his CHF   CKD IIIA:  Follow closely.    DM:  Last A1C WAS 9.3.  Plans  per primary team.     New York Heart Association (NYHA) Functional Class NYHA Class III      For questions or updates, please contact [facility] Please consult [email or web address] for contact info under    Signed, Minus Breeding, MD  06/11/2020 9:55 AM

## 2020-06-11 NOTE — ED Triage Notes (Signed)
Patient brought in by ems from home. Patient with complaint of chest pain and shortness of breath times two months. Patient states that he was discharged Friday for the same. Patient states that he does not know what he was discharge diagnosis is.

## 2020-06-11 NOTE — ED Triage Notes (Signed)
Pt to ER via EMS discharged on Friday with diagnosis of blood clots in lungs.  Told to follow up with clinic next week.  Pt has not filled new RX.  CBG 231.  Pt told EMS he wants to come to ER to "rest".

## 2020-06-11 NOTE — ED Notes (Signed)
Attempted report. Was told RN stepped off the floor and will call back down to ER for report

## 2020-06-11 NOTE — ED Notes (Signed)
Pt eating food tray at this time

## 2020-06-11 NOTE — ED Provider Notes (Signed)
[facility] Emergency Department Provider Note  Time seen: 9:10 AM  I have reviewed the triage vital signs and the nursing notes.   HISTORY  Chief Complaint Chest Pain   HPI Gabriel [name]. is a 49 y.o. male with a past medical history of chest pain, CHF, CKD, COPD, diabetes, hypertension, presents to the emergency department for chest pain.  According to the patient over the past several weeks he has had ongoing chest pain that has been intermittent in nature.  He states over the past 1 week he has been much more constant.  Patient was admitted to the hospital recently, discharged 3 to 4 days ago after an admission for similar chest pain but he states it has worsened since going home.  Patient states mild shortness of breath.  Frequent dry cough.  Denies any fever.  States intermittent nausea but denies vomiting.  Intermittent diaphoresis as well.   Past Medical History:  Diagnosis Date  . Chest wall pain, chronic   . Chronic systolic CHF (congestive heart failure) ([place])    a. 03/2015 Echo: EF 45-50%; b. 12/2015 Echo: EF 20-25%; c. 02/2016 Echo: EF 30-35%; d. 11/2016 Echo: EF 40-45%; e. 06/2019 Echo: EF 30-35%; f. 4/42021 Echo: EF 25-30%.  . Chronic Troponin Elevation   . CKD (chronic kidney disease), stage II-III   . COPD (chronic obstructive pulmonary disease) ([place])   . Diabetes mellitus without complication ([place])   . Hypertension    Resolved since weight loss  . NICM (nonischemic cardiomyopathy) ([place])    a. 03/2015 Echo: EF 45-50%; b. 12/2015 Echo: EF 20-25%; c. 02/2016 Echo: EF 30-35%; d. 11/2016 Echo: EF 40-45%; e. 06/2019 Echo: EF 30-35%; f. 11/2019 Echo: EF 25-30%, glob HK. Mild LVH. Gr2 DD. Mild-mod AI. BAE. Triv MR.  . Non-obstructive CAD (coronary artery disease)    a. 04/2015 low risk MV;  b. 12/2016 Cath: minor irregs in LAD/Diag/LCX/OM, RCA 40p/m/d.  [name] (nonsustained ventricular tachycardia) ([place])    a. 12/2015 noted on tele-->amio;  b. 12/2015 Event  monitor: no VT. Atach noted.  . Obesity (BMI 30.0-34.9)   . Psoriasis   . Syncope    a. 01/2016 - felt to be vasovagal.  . Tobacco abuse     Patient Active Problem List   Diagnosis Date Noted  . Hyperglycemia   . Elevated brain natriuretic peptide (BNP) level   . Uncontrolled type 2 diabetes mellitus with hyperglycemia, with long-term current use of insulin ([place]) 05/13/2020  . Acute on chronic systolic (congestive) heart failure ([place]) 05/12/2020  . Erectile dysfunction due to arterial insufficiency 03/15/2020  . Screening PSA (prostate specific antigen) 03/15/2020  . Erectile dysfunction 03/02/2020  . Acute pulmonary embolism ([place]) 01/03/2020  . Pulmonary embolism without acute cor pulmonale ([place])   . 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 Hemoptysis   . Costochondritis   . Acute respiratory failure with hypoxia ([place])   . Acute on chronic combined systolic (congestive) and diastolic (congestive) heart failure ([place]) 12/20/2019  . Acute on chronic heart failure ([place]) 11/22/2019  . CKD stage 3 due to type 2 diabetes mellitus ([place]) 06/26/2019  . Type 2 diabetes mellitus with hyperlipidemia ([place]) 06/26/2019  . Hypertensive urgency 06/26/2019  . Acute on chronic combined systolic and diastolic CHF (congestive heart failure) ([place]) 09/02/2017  . Other chest pain 08/24/2017  . Dizziness   . Noncompliance with medications 04/29/2017  . Back pain 03/06/2017  . Tobacco use 12/04/2016  . Gallbladder sludge 12/04/2016  .  Acute on chronic systolic CHF (congestive heart failure) ([place]) 11/28/2016  . Coronary artery disease, non-occlusive 03/15/2016  . Atypical chest pain 02/16/2016  . Orthostatic hypotension 01/23/2016  . Hypomagnesemia 01/23/2016  . Chronic systolic CHF (congestive heart failure) ([place]) 01/23/2016  . NICM (nonischemic cardiomyopathy) ([place]) 01/17/2016  . NSVT (nonsustained ventricular tachycardia) ([place])   . Troponin I above reference range   . Hyperlipidemia 05/17/2015  .  COPD exacerbation ([place]) 05/17/2015  . Essential hypertension 03/31/2015    Past Surgical History:  Procedure Laterality Date  . AMPUTATION    . CARDIAC CATHETERIZATION    . FINGER AMPUTATION     Traumatic  . FINGER FRACTURE SURGERY Left   . LEFT HEART CATH AND CORONARY ANGIOGRAPHY N/A 01/06/2017   Procedure: Left Heart Cath and Coronary Angiography;  Surgeon: Wellington Hampshire, MD;  Location: [facility];  Service: Cardiovascular;  Laterality: N/A;    Prior to Admission medications   Medication Sig Start Date End Date Taking? Authorizing Provider  blood glucose meter kit and supplies KIT Dispense based on patient and insurance preference. Use up to four times daily as directed. (FOR ICD-9 250.00, 250.01). 06/09/20   Lorella Nimrod, MD  carvedilol (COREG) 25 MG tablet Take 1 tablet (25 mg total) by mouth 2 (two) times daily with a meal. 06/09/20 07/09/20  Lorella Nimrod, MD  cloNIDine (CATAPRES) 0.1 MG tablet Take 0.5 tablets (0.05 mg total) by mouth 2 (two) times daily. 06/09/20   Lorella Nimrod, MD  fenofibrate (TRICOR) 145 MG tablet Take 1 tablet (145 mg total) by mouth daily. 06/09/20   Lorella Nimrod, MD  Insulin Glargine (BASAGLAR KWIKPEN) 100 UNIT/ML Inject 28 Units into the skin at bedtime. 06/09/20   Lorella Nimrod, MD  insulin lispro (HUMALOG) 100 UNIT/ML KwikPen Inject 3 Units into the skin 3 (three) times daily before meals. 06/09/20   Lorella Nimrod, MD  Insulin Pen Needle 29G X 5MM MISC 1 Dose by Does not apply route 4 (four) times daily -  with meals and at bedtime. 06/09/20   Lorella Nimrod, MD  Ipratropium-Albuterol (COMBIVENT) 20-100 MCG/ACT AERS respimat Inhale 1 puff into the lungs every 6 (six) hours. 06/09/20   Lorella Nimrod, MD  nitroGLYCERIN (NITROSTAT) 0.4 MG SL tablet Place 1 tablet (0.4 mg total) under the tongue every 5 (five) minutes x 3 doses as needed for chest pain. 06/09/20   Lorella Nimrod, MD  RIVAROXABAN Alveda Reasons) VTE STARTER PACK (15 & 20 MG TABLETS)  Follow package directions: Take one 21m tablet by mouth twice a day. On day 22, switch to one 277mtablet once a day. Take with food. 06/09/20   AmLorella NimrodMD  rosuvastatin (CRESTOR) 20 MG tablet Take 1 tablet (20 mg total) by mouth at bedtime. 06/09/20   AmLorella NimrodMD  sacubitril-valsartan (ENTRESTO) 49-51 MG Take 1 tablet by mouth 2 (two) times daily. 06/09/20   AmLorella NimrodMD  spironolactone (ALDACTONE) 25 MG tablet Take 1 tablet (25 mg total) by mouth 2 (two) times daily. 06/09/20 07/09/20  AmLorella NimrodMD  torsemide (DEMADEX) 20 MG tablet Take 2 tablets (40 mg total) by mouth daily. 06/09/20   AmLorella NimrodMD    Allergies  Allergen Reactions  . Metformin And Related Nausea And Vomiting  . Prednisone Other (See Comments)    Reaction: Hallucinations    . Zantac [Ranitidine Hcl] Diarrhea and Nausea Only    Night sweats    Family History  Problem Relation Age of Onset  . Diabetes  Mellitus II Mother   . Hypothyroidism Mother   . Hypertension Mother   . Hypertension Father   . Gout Father   . Cancer Paternal Aunt   . Cancer Maternal Grandfather   . Diabetes Maternal Grandfather     Social History Social History   Tobacco Use  . Smoking status: Former Smoker    Packs/day: 0.50    Years: 33.00    Pack years: 16.50    Types: Cigarettes    Quit date: 05/08/2020    Years since quitting: 0.0  . Smokeless tobacco: Former Network engineer  . Vaping Use: Former  Substance Use Topics  . Alcohol use: Yes    Alcohol/week: 0.0 standard drinks    Comment: occassionally  . Drug use: No    Review of Systems Constitutional: Negative for fever. Cardiovascular: Positive for chest pain central to left-sided, dull aching. Respiratory: Positive for shortness of breath.  Frequent dry cough. Gastrointestinal: Negative for abdominal pain, vomiting  Musculoskeletal: Negative for musculoskeletal complaints Neurological: Negative for headache All other ROS  negative  ____________________________________________   PHYSICAL EXAM:  VITAL SIGNS: ED Triage Vitals [06/11/20 0652]  Enc Vitals Group     BP (!) 183/112     Pulse Rate 84     Resp (!) 22     Temp 98.3 F (36.8 C)     Temp Source Oral     SpO2 99 %     Weight 256 lb (116.1 kg)     Height 6' (1.829 m)     Head Circumference      Peak Flow      Pain Score 10     Pain Loc      Pain Edu?      Excl. in [place]?    Constitutional: Alert and oriented. Well appearing and in no distress. Eyes: Normal exam ENT      Head: Normocephalic and atraumatic.      Mouth/Throat: Mucous membranes are moist. Cardiovascular: Normal rate, regular rhythm.  Respiratory: Normal respiratory effort without tachypnea nor retractions. Breath sounds are clear  Gastrointestinal: Soft and nontender. No distention Musculoskeletal: Nontender with normal range of motion in all extremities.  Neurologic:  Normal speech and language. No gross focal neurologic deficits  Skin:  Skin is warm, dry and intact.  Psychiatric: Mood and affect are normal.  ____________________________________________    EKG  EKG viewed and interpreted by myself shows a normal sinus rhythm 82 bpm with a narrow QRS, right axis deviation, largely normal intervals and nonspecific ST changes.  ____________________________________________    RADIOLOGY  X-ray shows stable interstitial changes  ____________________________________________   INITIAL IMPRESSION / ASSESSMENT AND PLAN / ED COURSE  Pertinent labs & imaging results that were available during my care of the patient were reviewed by me and considered in my medical decision making (see chart for details).   Patient presents to the emergency department for chest pain.  Patient was recently admitted for the same, at that time his troponins ranged in the 30-50 range per record review however today his troponin is 760 consistent with NSTEMI.  I reviewed the patient's discharge  summary further cardiology work-up was not performed during his admission but I do believe the patient would benefit from cardiology consultation and possible cardiac catheterization.  We will place the patient on a heparin infusion, dose pain medication as well as nitroglycerin ointment and continue to reassess.  Patient will be admitted to the hospital service for further work-up  and treatment.  Patient follows up with [name].  Leshon Armistead. was evaluated in Emergency Department on 06/11/2020 for the symptoms described in the history of present illness. He was evaluated in the context of the global COVID-19 pandemic, which necessitated consideration that the patient might be at risk for infection with the SARS-CoV-2 virus that causes COVID-19. Institutional protocols and algorithms that pertain to the evaluation of patients at risk for COVID-19 are in a state of rapid change based on information released by regulatory bodies including the CDC and federal and state organizations. These policies and algorithms were followed during the patient's care in the ED.  CRITICAL CARE Performed by: Harvest Dark   Total critical care time: 30 minutes  Critical care time was exclusive of separately billable procedures and treating other patients.  Critical care was necessary to treat or prevent imminent or life-threatening deterioration.  Critical care was time spent personally by me on the following activities: development of treatment plan with patient and/or surrogate as well as nursing, discussions with consultants, evaluation of patient's response to treatment, examination of patient, obtaining history from patient or surrogate, ordering and performing treatments and interventions, ordering and review of laboratory studies, ordering and review of radiographic studies, pulse oximetry and re-evaluation of patient's condition.  ____________________________________________   FINAL CLINICAL  IMPRESSION(S) / ED DIAGNOSES  Chest pain NSTEMI   Harvest Dark, MD 06/11/20 [phone]

## 2020-06-12 DIAGNOSIS — I5023 Acute on chronic systolic (congestive) heart failure: Secondary | ICD-10-CM

## 2020-06-12 DIAGNOSIS — I248 Other forms of acute ischemic heart disease: Secondary | ICD-10-CM | POA: Diagnosis not present

## 2020-06-12 LAB — URINE DRUG SCREEN, QUALITATIVE (ARMC ONLY)
Amphetamines, Ur Screen: NOT DETECTED
Barbiturates, Ur Screen: NOT DETECTED
Benzodiazepine, Ur Scrn: NOT DETECTED
Cannabinoid 50 Ng, Ur ~~LOC~~: NOT DETECTED
Cocaine Metabolite,Ur ~~LOC~~: NOT DETECTED
MDMA (Ecstasy)Ur Screen: NOT DETECTED
Methadone Scn, Ur: NOT DETECTED
Opiate, Ur Screen: POSITIVE — AB
Phencyclidine (PCP) Ur S: NOT DETECTED
Tricyclic, Ur Screen: NOT DETECTED

## 2020-06-12 LAB — HEPARIN LEVEL (UNFRACTIONATED)
Heparin Unfractionated: 0.45 IU/mL (ref 0.30–0.70)
Heparin Unfractionated: 0.27 IU/mL — ABNORMAL LOW (ref 0.30–0.70)
Heparin Unfractionated: 0.3 IU/mL (ref 0.30–0.70)

## 2020-06-12 LAB — CBC
HCT: 49 % (ref 39.0–52.0)
Hemoglobin: 16.1 g/dL (ref 13.0–17.0)
MCH: 28.3 pg (ref 26.0–34.0)
MCHC: 32.9 g/dL (ref 30.0–36.0)
MCV: 86.1 fL (ref 80.0–100.0)
Platelets: 206 10*3/uL (ref 150–400)
RBC: 5.69 MIL/uL (ref 4.22–5.81)
RDW: 14 % (ref 11.5–15.5)
WBC: 10.5 10*3/uL (ref 4.0–10.5)
nRBC: 0 % (ref 0.0–0.2)

## 2020-06-12 LAB — URINALYSIS, COMPLETE (UACMP) WITH MICROSCOPIC
Bacteria, UA: NONE SEEN
Bilirubin Urine: NEGATIVE
Glucose, UA: 150 mg/dL — AB
Hgb urine dipstick: NEGATIVE
Ketones, ur: NEGATIVE mg/dL
Leukocytes,Ua: NEGATIVE
Nitrite: NEGATIVE
Protein, ur: >=300 mg/dL — AB
Specific Gravity, Urine: 1.013 (ref 1.005–1.030)
pH: 8 (ref 5.0–8.0)

## 2020-06-12 LAB — APTT
aPTT: 51 seconds — ABNORMAL HIGH (ref 24–36)
aPTT: 68 seconds — ABNORMAL HIGH (ref 24–36)

## 2020-06-12 LAB — BASIC METABOLIC PANEL
Anion gap: 10 (ref 5–15)
Anion gap: 11 (ref 5–15)
BUN: 31 mg/dL — ABNORMAL HIGH (ref 6–20)
BUN: 33 mg/dL — ABNORMAL HIGH (ref 6–20)
CO2: 29 mmol/L (ref 22–32)
CO2: 27 mmol/L (ref 22–32)
Calcium: 9.4 mg/dL (ref 8.9–10.3)
Calcium: 8.6 mg/dL — ABNORMAL LOW (ref 8.9–10.3)
Chloride: 101 mmol/L (ref 98–111)
Chloride: 98 mmol/L (ref 98–111)
Creatinine, Ser: 1.5 mg/dL — ABNORMAL HIGH (ref 0.61–1.24)
Creatinine, Ser: 1.44 mg/dL — ABNORMAL HIGH (ref 0.61–1.24)
GFR, Estimated: 56 mL/min — ABNORMAL LOW (ref >60)
GFR, Estimated: 59 mL/min — ABNORMAL LOW (ref >60)
Glucose, Bld: 187 mg/dL — ABNORMAL HIGH (ref 70–99)
Glucose, Bld: 219 mg/dL — ABNORMAL HIGH (ref 70–99)
Potassium: 4.2 mmol/L (ref 3.5–5.1)
Potassium: 3.8 mmol/L (ref 3.5–5.1)
Sodium: 140 mmol/L (ref 135–145)
Sodium: 136 mmol/L (ref 135–145)

## 2020-06-12 LAB — GLUCOSE, CAPILLARY
Glucose-Capillary: 162 mg/dL — ABNORMAL HIGH (ref 70–99)
Glucose-Capillary: 205 mg/dL — ABNORMAL HIGH (ref 70–99)
Glucose-Capillary: 249 mg/dL — ABNORMAL HIGH (ref 70–99)
Glucose-Capillary: 205 mg/dL — ABNORMAL HIGH (ref 70–99)

## 2020-06-12 LAB — MAGNESIUM
Magnesium: 1.8 mg/dL (ref 1.7–2.4)
Magnesium: 1.7 mg/dL (ref 1.7–2.4)

## 2020-06-12 LAB — BRAIN NATRIURETIC PEPTIDE: B Natriuretic Peptide: 917 pg/mL — ABNORMAL HIGH (ref 0.0–100.0)

## 2020-06-12 MED ORDER — METHOCARBAMOL 500 MG PO TABS
500.0000 mg | ORAL_TABLET | Freq: Three times a day (TID) | ORAL | Status: DC
  Administered 2020-06-12 – 2020-06-14 (×4): 500 mg via ORAL
  Filled 2020-06-12 (×7): qty 1

## 2020-06-12 MED ORDER — MENTHOL 3 MG MT LOZG
1.0000 | LOZENGE | OROMUCOSAL | Status: DC | PRN
  Filled 2020-06-12: qty 9

## 2020-06-12 MED ORDER — DM-GUAIFENESIN ER 30-600 MG PO TB12
1.0000 | ORAL_TABLET | Freq: Two times a day (BID) | ORAL | Status: DC
  Administered 2020-06-13 – 2020-06-14 (×2): 1 via ORAL
  Filled 2020-06-12 (×3): qty 1

## 2020-06-12 MED ORDER — BENZONATATE 100 MG PO CAPS
100.0000 mg | ORAL_CAPSULE | Freq: Three times a day (TID) | ORAL | Status: DC
  Administered 2020-06-12 – 2020-06-14 (×5): 100 mg via ORAL
  Filled 2020-06-12 (×5): qty 1

## 2020-06-12 NOTE — Progress Notes (Addendum)
[facility] for heparin infusion  Indication: chest pain/ACS/PE  Allergies  Allergen Reactions  . Metformin And Related Nausea And Vomiting  . Prednisone Other (See Comments)    Reaction: Hallucinations    . Zantac [Ranitidine Hcl] Diarrhea and Nausea Only    Night sweats    Patient Measurements: Height: 6' 0.01" (182.9 cm) Weight: 114.3 kg (252 lb) IBW/kg (Calculated) : 77.62 Heparin Dosing Weight: 102  Vital Signs: Temp: 98.3 F (36.8 C) (10/25 1951) Temp Source: Oral (10/25 1951) BP: 128/80 (10/25 1951) Pulse Rate: 72 (10/25 1954)  Labs: Recent Labs    06/11/20 0707 06/11/20 0919 06/11/20 0919 06/11/20 1625 06/12/20 0019 06/12/20 0427 06/12/20 1048 06/12/20 2009  HGB 16.6  --   --   --   --  16.1  --   --   HCT 51.5  --   --   --   --  49.0  --   --   PLT 219  --   --   --   --  206  --   --   APTT  --  30   < > 80* 68*  --  51*  --   LABPROT  --  13.9  --   --   --   --   --   --   INR  --  1.1  --   --   --   --   --   --   HEPARINUNFRC  --  0.56   < >  --   --  0.45 0.27* 0.30  CREATININE 1.44*  --   --   --   --  1.50*  --  1.44*  TROPONINIHS 760* 679*  --   --   --   --   --   --    < > = values in this interval not displayed.    Estimated Creatinine Clearance: 79.2 mL/min (A) (by C-G formula based on SCr of 1.44 mg/dL (H)).   Medical History: Past Medical History:  Diagnosis Date  . Chest wall pain, chronic   . Chronic systolic CHF (congestive heart failure) ([place])   . Chronic Troponin Elevation   . CKD (chronic kidney disease), stage II-III   . COPD (chronic obstructive pulmonary disease) ([place])   . Diabetes mellitus without complication ([place])   . Hypertension    Resolved since weight loss  . NICM (nonischemic cardiomyopathy) ([place])    a. 03/2015 Echo: EF 45-50%; b. 12/2015 Echo: EF 20-25%; c. 02/2016 Echo: EF 30-35%; d. 11/2016 Echo: EF 40-45%; e. 06/2019 Echo: EF 30-35%; f. 11/2019 Echo: EF 25-30%, glob HK. Mild  LVH. Gr2 DD. Mild-mod AI. BAE. Triv MR.  . Non-obstructive CAD (coronary artery disease)    a. 04/2015 low risk MV;  b. 12/2016 Cath: minor irregs in LAD/Diag/LCX/OM, RCA 40p/m/d.  [name] (nonsustained ventricular tachycardia) ([place])    a. 12/2015 noted on tele-->amio;  b. 12/2015 Event monitor: no VT. Atach noted.  . Obesity (BMI 30.0-34.9)   . Psoriasis   . Syncope    a. 01/2016 - felt to be vasovagal.  . Tobacco abuse     Assessment: Pharmacy consulted for heparin infusion dosing and monitoring for 49 yo male admitted with ACS/STEMI. Patient reports ongoing chest pain over the past week. He has PMH of CHF, CKD, COPD, DM, HTN, and PE. He was recently discharged from the hospital on 10/22 after admission for PEs. Patient was started  on Xarelto during his inpatient visit, but did not pick up his prescription from the pharmacy. Last dose of Xarelto 10/22.   Goal of Therapy:  Heparin level 0.3-0.7 units/ml aPTT 66-102 seconds Monitor platelets by anticoagulation protocol: Yes  10/24 - heparin 5000 units x 1, 1800 units/hr (based on previous visit) 10/24 @ 1625 APTT 80 - continue 1800 units/hr.  10/25 @ 0019 aPTT 68 Heparin level 0.45  10/25 @ 1048 aPTT 51 Heparin level 0.27 - increase heparin to 1900 units/hr. 10/25 @ 2009 HL 0.30 (drawn ~ 5 hours after rate change)  Plan:  Heparin level is at lower limit of therapeutic range. Given heparin level was drawn slightly early, will continue at 1900 units/hr. Recheck heparin level in 6 hours. CBC daily while on heparin.   Benita Gutter 06/12/2020 8:53 PM

## 2020-06-12 NOTE — Progress Notes (Signed)
Gabriel [name]. is a 49 y.o. male with medical history significant of hx of CAD, HLD, HTN, DM, COPD, PE not taking his Xarelto, sCHF, CKD-III, NSVT, former smoker, frequent hospitalization with being noncompliant and CHF exacerbations.  Recently discharged on 06/09/2020 after being IV diuresed, he was discharged on torsemide and Xarelto and never picked up his medications according to pharmacy information. Admitted overnight again with chest pain and volume overload.  When I entered his room this morning and started saying that I am sorry that you had to come back to the hospital that early, how is your chest pain.  He all of a sudden blasted out stating that you get out of my room you are responsible for my condition as you discharge me from hospital. There was another gentleman in the room who also started blaming me that I am responsible for his condition and pointed out to the daughter stating that I should leave the room immediately.  Discussed with my colleague Dr. Berle Mull who will take over his care. Patient will remain high risk for readmission due to his behavior and being noncompliant as he never take his medications whenever discharge resulted in multiple hospitalizations for similar reasons.

## 2020-06-12 NOTE — Progress Notes (Signed)
[facility] for heparin infusion  Indication: chest pain/ACS/PE  Allergies  Allergen Reactions  . Metformin And Related Nausea And Vomiting  . Prednisone Other (See Comments)    Reaction: Hallucinations    . Zantac [Ranitidine Hcl] Diarrhea and Nausea Only    Night sweats    Patient Measurements: Height: 6' 0.01" (182.9 cm) Weight: 114.3 kg (252 lb) IBW/kg (Calculated) : 77.62 Heparin Dosing Weight: 102  Vital Signs: Temp: 98.1 F (36.7 C) (10/25 1134) Temp Source: Oral (10/25 1134) BP: 123/83 (10/25 1134) Pulse Rate: 62 (10/25 1134)  Labs: Recent Labs    06/11/20 0707 06/11/20 0919 06/11/20 0919 06/11/20 1625 06/12/20 0019 06/12/20 0427 06/12/20 1048  HGB 16.6  --   --   --   --  16.1  --   HCT 51.5  --   --   --   --  49.0  --   PLT 219  --   --   --   --  206  --   APTT  --  30   < > 80* 68*  --  51*  LABPROT  --  13.9  --   --   --   --   --   INR  --  1.1  --   --   --   --   --   HEPARINUNFRC  --  0.56  --   --   --  0.45 0.27*  CREATININE 1.44*  --   --   --   --  1.50*  --   TROPONINIHS 760* 679*  --   --   --   --   --    < > = values in this interval not displayed.    Estimated Creatinine Clearance: 76.1 mL/min (A) (by C-G formula based on SCr of 1.5 mg/dL (H)).   Medical History: Past Medical History:  Diagnosis Date  . Chest wall pain, chronic   . Chronic systolic CHF (congestive heart failure) ([place])   . Chronic Troponin Elevation   . CKD (chronic kidney disease), stage II-III   . COPD (chronic obstructive pulmonary disease) ([place])   . Diabetes mellitus without complication ([place])   . Hypertension    Resolved since weight loss  . NICM (nonischemic cardiomyopathy) ([place])    a. 03/2015 Echo: EF 45-50%; b. 12/2015 Echo: EF 20-25%; c. 02/2016 Echo: EF 30-35%; d. 11/2016 Echo: EF 40-45%; e. 06/2019 Echo: EF 30-35%; f. 11/2019 Echo: EF 25-30%, glob HK. Mild LVH. Gr2 DD. Mild-mod AI. BAE. Triv MR.  . Non-obstructive  CAD (coronary artery disease)    a. 04/2015 low risk MV;  b. 12/2016 Cath: minor irregs in LAD/Diag/LCX/OM, RCA 40p/m/d.  [name] (nonsustained ventricular tachycardia) ([place])    a. 12/2015 noted on tele-->amio;  b. 12/2015 Event monitor: no VT. Atach noted.  . Obesity (BMI 30.0-34.9)   . Psoriasis   . Syncope    a. 01/2016 - felt to be vasovagal.  . Tobacco abuse     Assessment: Pharmacy consulted for heparin infusion dosing and monitoring for 49 yo male admitted with ACS/STEMI. Patient reports ongoing chest pain over the past week. He has PMH of CHF, CKD, COPD, DM, HTN, and PE. He was recently discharged from the hospital on 10/22 after admission for PEs. Patient was started on Xarelto during his inpatient visit, but did not pick up his prescription from the pharmacy. Last dose of Xarelto 10/22.   Goal of Therapy:  Heparin level 0.3-0.7 units/ml aPTT 66-102 seconds Monitor platelets by anticoagulation protocol: Yes  10/24 - heparin 5000 units x 1, 1800 units/hr (based on previous visit) 10/24 @ 1625 APTT 80 - continue 1800 units/hr.  10/25 @ 0019 aPTT 68 Heparin level 0.45  10/25 @ 1048 aPTT 51 Heparin level 0.27 - increase heparin to 1900 units/hr.  Plan:  Heparin level and aPTT are slightly subtherapeutic. APTT and heparin level seem to be correlating, will continue with heparin level for monitoring. Will increase heparin infusion to1900 units/hr. Recheck heparin level in 6 hours. CBC daily while on heparin.   Oswald Hillock, PharmD, BCPS 06/12/2020 1:08 PM

## 2020-06-12 NOTE — Progress Notes (Signed)
  Heart Failure Nurse Navigator Note  HFrEF 35-40%, mildly elevated pulmonary arterial pressures, mod left atrial enlargement, mild to moderate MR.  He presented to the ED with complaints of chest discomfort.  He had just been discharged from the hospital 2 days prior.  He states he has this chest discomfort almost continually, just waxes and wanes since his MI in 2018.  Co morbidities:  Coronary artery disease CKD COPD Diabetes Hypertension Hyperlipidemia     Medications:  ASA 81 mg daily Coreg 25 mg BID Lasix 40 mg IV BID Crestor 20 mg daily Entresto 49/51 mg BID Spironolactone 25 mg daily  Labs:  Sodium 140, potassium 4.2,creatinine 1.5, BUN 30, total cholesterol 126, triglycerides 130, Hdl 25 Ldl 75, troponin 760 and 679.  Intake 506 ml Output 3750 ml  BP 128/83   Assessment:  General- he is awake and alert, in no acute distress.  HEENT- pupils are equal, unable to assess for JVD due to body habitus.  Cardiac- heart tones of regular rate and rhythm. No rubs or gallops.  Chest- breath sounds are clear to posterior auscultation.  GI- abdomen is distended, firm  Musculoskeletal- no lower extremity edema, pedal pulses palpable 2+.  Psych- is pleasant and appropriate, makes eye contact.  Neuro- intact, speech clear.   Spoke with patient today, feels that he still has more fluid to get off his abdomen, is urinating well after given lasix.  Still has chest discomfort, states it has been there since his Mi in 2018- it waxes and wanes throughout  the  Day.  He also has a sharp discomfort that is reproducable  He is tentatively scheduled for right and left heart cath tomorrow.  Medications wise he is on 3 of the 4 pillars for GDMT, with cardiology's plan to add [facility] as an outpt.   Pricilla Riffle RN, CHFN

## 2020-06-12 NOTE — Progress Notes (Signed)
Progress Note  Patient Name: Gabriel [name]. Date of Encounter: 06/12/2020  Primary Cardiologist: Rockey Situ  Subjective   Breathing improving, though not back to baseline yet. Some chest tightness and palpitations.  Documented UOP of 3.2 L for the past 24 hours with a net negative 3.2 L for the admission. Weight 116.9-->114.3 kg.   Inpatient Medications    Scheduled Meds: . aspirin EC  81 mg Oral Daily  . carvedilol  25 mg Oral BID WC  . fenofibrate  160 mg Oral Daily  . furosemide  40 mg Intravenous Q12H  . insulin aspart  0-5 Units Subcutaneous QHS  . insulin aspart  0-9 Units Subcutaneous TID WC  . insulin glargine  19 Units Subcutaneous Daily  . ipratropium-albuterol  3 mL Inhalation Q6H  . rosuvastatin  20 mg Oral QHS  . sacubitril-valsartan  1 tablet Oral BID  . spironolactone  25 mg Oral BID   Continuous Infusions: . heparin 1,800 Units/hr (06/11/20 2151)   PRN Meds: acetaminophen, albuterol, dextromethorphan-guaiFENesin, hydrALAZINE, morphine injection, nitroGLYCERIN, ondansetron (ZOFRAN) IV   Vital Signs    Vitals:   06/12/20 0337 06/12/20 0339 06/12/20 0730 06/12/20 0738  BP: (!) 134/91   129/83  Pulse: 62   62  Resp: 20   16  Temp: (!) 97.4 F (36.3 C)   97.9 F (36.6 C)  TempSrc: Oral     SpO2: 96%  93% 100%  Weight:  114.3 kg    Height:        Intake/Output Summary (Last 24 hours) at 06/12/2020 0748 Last data filed at 06/12/2020 0516 Gross per 24 hour  Intake 506.27 ml  Output 3750 ml  Net -3243.73 ml   Filed Weights   06/11/20 0652 06/11/20 1607 06/12/20 0339  Weight: 116.1 kg 116.9 kg 114.3 kg    Telemetry    SR with 2 episodes of NSVT lasting 5 and 15 beats, ventricular couplets/triplets - Personally Reviewed  ECG    No new tracings - Personally Reviewed  Physical Exam   GEN: No acute distress.   Neck: No JVD. Cardiac: RRR, no murmurs, rubs, or gallops.  Respiratory: Faint crackles along the bilateral bases.  GI: Soft,  nontender, non-distended.   MS: No edema; No deformity. Neuro:  Alert and oriented x 3; Nonfocal.  Psych: Normal affect.  Labs    Chemistry Recent Labs  Lab 06/07/20 1831 06/08/20 0420 06/09/20 0514 06/11/20 0707 06/12/20 0427  NA  --    < > 138 138 140  K  --    < > 3.8 4.7 4.2  CL  --    < > 100 104 101  CO2  --    < > 27 23 29   GLUCOSE  --    < > 169* 237* 187*  BUN  --    < > 32* 30* 31*  CREATININE  --    < > 1.62* 1.44* 1.50*  CALCIUM  --    < > 9.2 9.8 9.4  PROT 6.3*  --   --   --   --   ALBUMIN 3.2*  --   --   --   --   AST 25  --   --   --   --   ALT 32  --   --   --   --   ALKPHOS 46  --   --   --   --   BILITOT 2.2*  --   --   --   --  GFRNONAA  --    < > 51* 59* 56*  ANIONGAP  --    < > 11 11 10    < > = values in this interval not displayed.     Hematology Recent Labs  Lab 06/09/20 0514 06/11/20 0707 06/12/20 0427  WBC 7.0 11.8* 10.5  RBC 5.09 5.93* 5.69  HGB 14.4 16.6 16.1  HCT 43.7 51.5 49.0  MCV 85.9 86.8 86.1  MCH 28.3 28.0 28.3  MCHC 33.0 32.2 32.9  RDW 14.1 14.0 14.0  PLT 175 219 206    Cardiac EnzymesNo results for input(s): TROPONINI in the last 168 hours. No results for input(s): TROPIPOC in the last 168 hours.   BNP Recent Labs  Lab 06/07/20 1144 06/11/20 0707  BNP 1,566.8* 1,553.1*     DDimer No results for input(s): DDIMER in the last 168 hours.   Radiology    DG Chest 2 View  Result Date: 06/11/2020 IMPRESSION: 1. Stable interstitial prominence bilaterally, suggesting chronic mild CHF/volume overload. 2. No evidence of superimposed pneumonia. 3. Stable cardiomegaly. Electronically Signed   By: Franki Cabot M.D.   On: 06/11/2020 07:47    Cardiac Studies   2D echo 06/08/2020: 1. Left ventricular ejection fraction, by estimation, is 35 to 40%. The  left ventricle has moderately decreased function. The left ventricle  demonstrates global hypokinesis. The left ventricular internal cavity size  was moderately dilated.  Left  ventricular diastolic parameters are indeterminate.  2. Right ventricular systolic function is normal. The right ventricular  size is normal. There is mildly elevated pulmonary artery systolic  pressure. The estimated right ventricular systolic pressure is 48.1 mmHg.  3. Left atrial size was moderately dilated.  4. Mild to moderate mitral valve regurgitation. __________  2D echo 11/2019: 1. Left ventricular ejection fraction, by estimation, is 25 to 30%. The  left ventricle has severely decreased function. The left ventricle  demonstrates global hypokinesis. The left ventricular internal cavity size  was mildly dilated. There is mild left  ventricular hypertrophy. Left ventricular diastolic parameters are  consistent with Grade III diastolic dysfunction (restrictive). Elevated  left atrial pressure.  2. Right ventricular systolic function is moderately reduced. The right  ventricular size is mildly enlarged. There is moderately elevated  pulmonary artery systolic pressure.  3. Left atrial size was mild to moderately dilated.  4. Right atrial size was mildly dilated.  5. The mitral valve is normal in structure. Trivial mitral valve  regurgitation. No evidence of mitral stenosis.  6. The aortic valve is tricuspid. Aortic valve regurgitation is mild to  moderate. Mild aortic valve sclerosis is present, with no evidence of  aortic valve stenosis.  7. Aortic dilatation noted. There is borderline dilatation of the aortic  root measuring 38 mm.  8. The inferior vena cava is dilated in size with <50% respiratory  variability, suggesting right atrial pressure of 15 mmHg.  __________  LHC 12/2016:  There is moderate to severe left ventricular systolic dysfunction.  LV end diastolic pressure is moderately elevated.  The left ventricular ejection fraction is 25-35% by visual estimate.  Dist RCA lesion, 40 %stenosed.  Prox RCA to Mid RCA lesion, 40 %stenosed.   1. Mild  nonobstructive coronary artery disease. 2. Moderately to severely reduced LV systolic function with an EF of 30-35%. 3. Moderately elevated left ventricular end-diastolic pressure. LVEDP was in the high 20s.  Recommendations: Continue medical therapy for nonobstructive coronary artery disease. The patient has nonischemic cardiomyopathy. He is volume overloaded and  I increased torsemide to 40 mg by mouth twice daily. Consider adding spironolactone or eplerenone in the near future   Patient Profile     49 y.o. 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  Assessment & Plan    1. Elevated HS-Tn: -Peaked at 760, now down trending -Felt to be secondary to supply demand ischemia -EKG not consistent with ACS -Prior LHC showed nonobstructive disease as outlined above -Echo earlier this month with improved LVSF as above -Consider outpatient Lexiscan MPI once his volume status is improved -Can transition from IV heparin back to PTA Xarelto once it is clear inpatient invasive testing will not be needed  -When [facility] is resumed can stop ASA -Coreg -Crestor  -UDS pending   2. Acute on chronic HFrEF secondary to dilated NICM: -Patient recently admitted from 10/20-10/22 with return to the hospital on 10/24 with continued dyspnea -Echo earlier this month with an improved LVSF with an EF of 35-40% -IV Lasix for at least another 24 hours -Continue to follow up with the advanced heart failure clinic  -Medication adherence appears to be a significant issue -Continue Entresto, Coreg, and spironolactone  -CHF education -Daily weights -Strict I/O  3. NSVT: -Asymptomatic  -Coreg  -Potassium at goal -Check magnesium  -Last echo with improved cardiomyopathy as above  4. DVT/PE: -Currently on  heparin  -Resume Xarelto prior to discharge   5. CKD stage III: -Relatively stable -Monitor  6. HTN: -Blood pressure much improved and currently well controlled -Continue medications as above  7. HLD: -LDL 75 this admission -Crestor   For questions or updates, please contact [place] Please consult [email or web address] for contact info under Cardiology/STEMI.    Signed, Christell Faith, PA-C [place] Pager: [phone] 06/12/2020, 7:48 AM

## 2020-06-12 NOTE — Progress Notes (Addendum)
[place] for heparin infusion  Indication: chest pain/ACS/PE  Allergies  Allergen Reactions  . Metformin And Related Nausea And Vomiting  . Prednisone Other (See Comments)    Reaction: Hallucinations    . Zantac [Ranitidine Hcl] Diarrhea and Nausea Only    Night sweats    Patient Measurements: Height: 6' 0.01" (182.9 cm) Weight: 114.3 kg (252 lb) IBW/kg (Calculated) : 77.62 Heparin Dosing Weight: 102  Vital Signs: Temp: 97.4 F (36.3 C) (10/25 0337) Temp Source: Oral (10/25 0337) BP: 134/91 (10/25 0337) Pulse Rate: 62 (10/25 0337)  Labs: Recent Labs    06/11/20 0707 06/11/20 0919 06/11/20 1625 06/12/20 0019 06/12/20 0427  HGB 16.6  --   --   --  16.1  HCT 51.5  --   --   --  49.0  PLT 219  --   --   --  206  APTT  --  30 80* 68*  --   LABPROT  --  13.9  --   --   --   INR  --  1.1  --   --   --   HEPARINUNFRC  --  0.56  --   --  0.45  CREATININE 1.44*  --   --   --  1.50*  TROPONINIHS 760* 679*  --   --   --     Estimated Creatinine Clearance: 76.1 mL/min (A) (by C-G formula based on SCr of 1.5 mg/dL (H)).   Medical History: Past Medical History:  Diagnosis Date  . Chest wall pain, chronic   . Chronic systolic CHF (congestive heart failure) ([place])   . Chronic Troponin Elevation   . CKD (chronic kidney disease), stage II-III   . COPD (chronic obstructive pulmonary disease) ([place])   . Diabetes mellitus without complication ([place])   . Hypertension    Resolved since weight loss  . NICM (nonischemic cardiomyopathy) ([place])    a. 03/2015 Echo: EF 45-50%; b. 12/2015 Echo: EF 20-25%; c. 02/2016 Echo: EF 30-35%; d. 11/2016 Echo: EF 40-45%; e. 06/2019 Echo: EF 30-35%; f. 11/2019 Echo: EF 25-30%, glob HK. Mild LVH. Gr2 DD. Mild-mod AI. BAE. Triv MR.  . Non-obstructive CAD (coronary artery disease)    a. 04/2015 low risk MV;  b. 12/2016 Cath: minor irregs in LAD/Diag/LCX/OM, RCA 40p/m/d.  [name] (nonsustained ventricular tachycardia)  ([place])    a. 12/2015 noted on tele-->amio;  b. 12/2015 Event monitor: no VT. Atach noted.  . Obesity (BMI 30.0-34.9)   . Psoriasis   . Syncope    a. 01/2016 - felt to be vasovagal.  . Tobacco abuse     Assessment: Pharmacy consulted for heparin infusion dosing and monitoring for 49 yo male admitted with ACS/STEMI. Patient reports ongoing chest pain over the past week. He has PMH of CHF, CKD, COPD, DM, HTN, and PE. He was recently discharged from the hospital on 10/22 after admission for PEs. Patient was started on Xarelto during his inpatient visit, but did not pick up his prescription from the pharmacy. Last dose of Xarelto 10/22.   Goal of Therapy:  Heparin level 0.3-0.7 units/ml aPTT 66-102 seconds Monitor platelets by anticoagulation protocol: Yes  10/24 - heparin 5000 units x 1, 1800 units/hr (based on previous visit) 10/24 @ 1625 APTT 80 - continue 1800 units/hr.  10/25 @ 0019 aPTT 68 Heparin level 0.45   Plan:  Heparin level and aPTT are therapeutic. APTT and heparin level seem to be correlating. Will continue  heparin infusion at 1800 units/hr. Recheck heparin level and aPTT in 6 hours to confirm correlation and switch to heparin level monitoring. CBC daily while on heparin.   Oswald Hillock, PharmD, BCPS 06/12/2020 7:28 AM

## 2020-06-12 NOTE — Progress Notes (Signed)
[facility] Progress Note  Patient: Gabriel [name].    OQH:[id number]  DOA: 06/11/2020     Date of Service: the patient was seen and examined on 06/12/2020  Brief hospital course: Gabriel [name]. is a 49 y.o. male with medical history significant of hx of CAD, HLD, HTN, DM, COPD, PE not taking his Xarelto, sCHF, CKD-III, NSVT, former smoker, presents with CP and SOB.   Currently plan is treat heart failure and further work-up per cardiology.  Assessment and Plan: Elevated troponin.  From demand ischemia No evidence of non-STEMI. hx of Coronary artery disease, non-occlusive: trop 760. Dr. Percival Spanish for cardiology is consulted Currently on IV heparin. UDS negative. Cardiology planning further work-up if needed.  Chest pain and chest heaviness. Acute on chronic systolic CHF. Volume overload is likely associated with patient's complaint. Currently receiving IV Lasix. Monitor renal function. Also on Entresto carvedilol and Aldactone. Cardiology considering outpatient Farxiga  Essential hypertension: IV hydralazine as needed -Continue Entresto, Coreg -will not restart clonidine  Hyperlipidemia -Crestor  CKD-IIIa:  Recent baseline creatinine 1.26 on 10/21/202.  His creatinine is 1.44, BUN 30, slightly worsening than baseline.  Type II diabetes mellitus with renal manifestations [facility]): Recent A1c 9.3, poorly controlled -SSI -Decreased glargine insulin dose from 28 to 19 units daily  COPD (chronic obstructive pulmonary disease) ([place]): No wheezing on auscultation -Continue bronchodilators  Leukocytosis: Mild leukocytosis with WBC 11.8, but no fever.  No source of infection, likely reactive. -Follow-up by CBC  PE (pulmonary thromboembolism) ([place]): Not compliance to Xarelto -Currently on IV heparin  Obesity Likely part of patient's complaint of chest heaviness. Body mass index is 34.17 kg/m.   Diet: Cardiac diet DVT Prophylaxis: Therapeutic  Anticoagulation with Heparin   Advance goals of care discussion: Full code  Family Communication: no family was present at bedside, at the time of interview.   Disposition:  Status is: Inpatient  Remains inpatient appropriate because:IV treatments appropriate due to intensity of illness or inability to take PO   Dispo: The patient is from: Home              Anticipated d/c is to: Home              Anticipated d/c date is: 2 days              Patient currently is not medically stable to d/c.  Subjective: No nausea no vomiting.  No fever no chills.  Continues to have chest heaviness and chest tightness.  Reports leg cramps in the afternoon.  Physical Exam:  General: Appear in mild distress, no Rash; Oral Mucosa Clear, moist. no Abnormal Neck Mass Or lumps, Conjunctiva normal  Cardiovascular: S1 and S2 Present, no Murmur, Respiratory: increased respiratory effort, Bilateral Air entry present and CTA, no Crackles, no wheezes Abdomen: Bowel Sound present, Soft and no tenderness Extremities: bilateral  Pedal edema Neurology: alert and oriented to time, place, and person affect appropriate. no new focal deficit Gait not checked due to patient safety concerns  Vitals:   06/12/20 0738 06/12/20 1134 06/12/20 1418 06/12/20 1636  BP: 129/83 123/83  123/85  Pulse: 62 62  61  Resp: 16 16  16   Temp: 97.9 F (36.6 C) 98.1 F (36.7 C)  97.8 F (36.6 C)  TempSrc:  Oral  Oral  SpO2: 100% 95% 99% 93%  Weight:      Height:        Intake/Output Summary (Last 24 hours) at 06/12/2020 1908 Last data  filed at 06/12/2020 1500 Gross per 24 hour  Intake 266.27 ml  Output 4150 ml  Net -3883.73 ml   Filed Weights   06/11/20 0652 06/11/20 1607 06/12/20 0339  Weight: 116.1 kg 116.9 kg 114.3 kg    Data Reviewed: I have personally reviewed and interpreted daily labs, tele strips, imagings as discussed above. I reviewed all nursing notes, pharmacy notes, vitals, pertinent old records I have  discussed plan of care as described above with RN and patient/family.  CBC: Recent Labs  Lab 06/07/20 1144 06/08/20 0420 06/09/20 0514 06/11/20 0707 06/12/20 0427  WBC 9.4 8.5 7.0 11.8* 10.5  HGB 15.3 14.6 14.4 16.6 16.1  HCT 46.1 44.2 43.7 51.5 49.0  MCV 84.7 85.8 85.9 86.8 86.1  PLT 192 186 175 219 939   Basic Metabolic Panel: Recent Labs  Lab 06/07/20 1144 06/07/20 1831 06/08/20 0420 06/09/20 0514 06/11/20 0707 06/12/20 0427  NA 137  --  137 138 138 140  K 4.3  --  4.0 3.8 4.7 4.2  CL 105  --  103 100 104 101  CO2 24  --  24 27 23 29   GLUCOSE 222*  --  203* 169* 237* 187*  BUN 21*  --  24* 32* 30* 31*  CREATININE 1.27*  --  1.26* 1.62* 1.44* 1.50*  CALCIUM 8.5*  --  8.7* 9.2 9.8 9.4  MG  --  1.8  --   --   --  1.8    Studies: No results found.  Scheduled Meds: . aspirin EC  81 mg Oral Daily  . benzonatate  100 mg Oral TID  . carvedilol  25 mg Oral BID WC  . dextromethorphan-guaiFENesin  1 tablet Oral BID  . furosemide  40 mg Intravenous Q12H  . insulin aspart  0-5 Units Subcutaneous QHS  . insulin aspart  0-9 Units Subcutaneous TID WC  . insulin glargine  19 Units Subcutaneous Daily  . ipratropium-albuterol  3 mL Inhalation Q6H  . methocarbamol  500 mg Oral TID  . rosuvastatin  20 mg Oral QHS  . sacubitril-valsartan  1 tablet Oral BID  . spironolactone  25 mg Oral BID   Continuous Infusions: . heparin 1,900 Units/hr (06/12/20 1452)   PRN Meds: acetaminophen, albuterol, hydrALAZINE, menthol-cetylpyridinium, morphine injection, nitroGLYCERIN, ondansetron (ZOFRAN) IV  Time spent: 35 minutes  Author: Berle Mull, MD [facility] 06/12/2020 7:08 PM  To reach On-call, see care teams to locate the attending and reach out via [email or web address]. Between 7PM-7AM, please contact night-coverage If you still have difficulty reaching the attending provider, please page the [facility] (Director on Call) for [facility] on amion for assistance.

## 2020-06-12 NOTE — Plan of Care (Signed)
°  Problem: Education: °Goal: Knowledge of General Education information will improve °Description: Including pain rating scale, medication(s)/side effects and non-pharmacologic comfort measures °Outcome: Progressing °  °Problem: Health Behavior/Discharge Planning: °Goal: Ability to manage health-related needs will improve °Outcome: Progressing °  °Problem: Clinical Measurements: °Goal: Ability to maintain clinical measurements within normal limits will improve °Outcome: Progressing °Goal: Will remain free from infection °Outcome: Progressing °Goal: Diagnostic test results will improve °Outcome: Progressing °Goal: Respiratory complications will improve °Outcome: Progressing °Goal: Cardiovascular complication will be avoided °Outcome: Progressing °  °Problem: Activity: °Goal: Risk for activity intolerance will decrease °Outcome: Progressing °  °Problem: Nutrition: °Goal: Adequate nutrition will be maintained °Outcome: Progressing °  °Problem: Coping: °Goal: Level of anxiety will decrease °Outcome: Progressing °  °Problem: Elimination: °Goal: Will not experience complications related to bowel motility °Outcome: Progressing °Goal: Will not experience complications related to urinary retention °Outcome: Progressing °  °Problem: Pain Managment: °Goal: General experience of comfort will improve °Outcome: Progressing °  °Problem: Safety: °Goal: Ability to remain free from injury will improve °Outcome: Progressing °  °Problem: Skin Integrity: °Goal: Risk for impaired skin integrity will decrease °Outcome: Progressing °  °Problem: Education: °Goal: Understanding of cardiac disease, CV risk reduction, and recovery process will improve °Outcome: Progressing °Goal: Individualized Educational Video(s) °Outcome: Progressing °  °Problem: Activity: °Goal: Ability to tolerate increased activity will improve °Outcome: Progressing °  °Problem: Cardiac: °Goal: Ability to achieve and maintain adequate cardiovascular perfusion will  improve °Outcome: Progressing °  °Problem: Health Behavior/Discharge Planning: °Goal: Ability to safely manage health-related needs after discharge will improve °Outcome: Progressing °  °

## 2020-06-12 NOTE — Plan of Care (Signed)
  Problem: Pain Managment: Goal: General experience of comfort will improve Outcome: Not Progressing   Problem: Activity: Goal: Ability to tolerate increased activity will improve Outcome: Progressing   Problem: Cardiac: Goal: Ability to achieve and maintain adequate cardiovascular perfusion will improve Outcome: Progressing

## 2020-06-13 ENCOUNTER — Encounter: Payer: Self-pay | Admitting: Internal Medicine

## 2020-06-13 ENCOUNTER — Encounter
Admission: EM | Disposition: A | Discharge status: Home / Self Care | Payer: Self-pay | Source: Home / Self Care | Attending: Internal Medicine

## 2020-06-13 DIAGNOSIS — I5023 Acute on chronic systolic (congestive) heart failure: Secondary | ICD-10-CM | POA: Diagnosis not present

## 2020-06-13 DIAGNOSIS — I214 Non-ST elevation (NSTEMI) myocardial infarction: Secondary | ICD-10-CM

## 2020-06-13 DIAGNOSIS — I2510 Atherosclerotic heart disease of native coronary artery without angina pectoris: Secondary | ICD-10-CM | POA: Diagnosis not present

## 2020-06-13 HISTORY — PX: RIGHT/LEFT HEART CATH AND CORONARY ANGIOGRAPHY: CATH118266

## 2020-06-13 LAB — CBC
HCT: 51.7 % (ref 39.0–52.0)
Hemoglobin: 17 g/dL (ref 13.0–17.0)
MCH: 28.1 pg (ref 26.0–34.0)
MCHC: 32.9 g/dL (ref 30.0–36.0)
MCV: 85.5 fL (ref 80.0–100.0)
Platelets: 179 10*3/uL (ref 150–400)
RBC: 6.05 MIL/uL — ABNORMAL HIGH (ref 4.22–5.81)
RDW: 13.7 % (ref 11.5–15.5)
WBC: 8.8 10*3/uL (ref 4.0–10.5)
nRBC: 0 % (ref 0.0–0.2)

## 2020-06-13 LAB — BASIC METABOLIC PANEL
Anion gap: 12 (ref 5–15)
BUN: 29 mg/dL — ABNORMAL HIGH (ref 6–20)
CO2: 27 mmol/L (ref 22–32)
Calcium: 9 mg/dL (ref 8.9–10.3)
Chloride: 98 mmol/L (ref 98–111)
Creatinine, Ser: 1.4 mg/dL — ABNORMAL HIGH (ref 0.61–1.24)
GFR, Estimated: >60 mL/min (ref >60)
Glucose, Bld: 214 mg/dL — ABNORMAL HIGH (ref 70–99)
Potassium: 3.9 mmol/L (ref 3.5–5.1)
Sodium: 137 mmol/L (ref 135–145)

## 2020-06-13 LAB — GLUCOSE, CAPILLARY
Glucose-Capillary: 189 mg/dL — ABNORMAL HIGH (ref 70–99)
Glucose-Capillary: 149 mg/dL — ABNORMAL HIGH (ref 70–99)
Glucose-Capillary: 204 mg/dL — ABNORMAL HIGH (ref 70–99)
Glucose-Capillary: 162 mg/dL — ABNORMAL HIGH (ref 70–99)

## 2020-06-13 LAB — HEPARIN LEVEL (UNFRACTIONATED)
Heparin Unfractionated: 0.26 IU/mL — ABNORMAL LOW (ref 0.30–0.70)
Heparin Unfractionated: 0.29 IU/mL — ABNORMAL LOW (ref 0.30–0.70)

## 2020-06-13 SURGERY — RIGHT/LEFT HEART CATH AND CORONARY ANGIOGRAPHY
Anesthesia: Moderate Sedation

## 2020-06-13 MED ORDER — IOHEXOL 300 MG/ML  SOLN
INTRAMUSCULAR | Status: DC | PRN
  Administered 2020-06-13: 33 mL

## 2020-06-13 MED ORDER — HYDRALAZINE HCL 20 MG/ML IJ SOLN: 10.0000 mg | INTRAMUSCULAR | Status: AC | PRN

## 2020-06-13 MED ORDER — SODIUM CHLORIDE 0.9 % IV SOLN: 250.0000 mL | INTRAVENOUS | Status: DC | PRN

## 2020-06-13 MED ORDER — SODIUM CHLORIDE 0.9% FLUSH: 3.0000 mL | INTRAVENOUS | Status: DC | PRN

## 2020-06-13 MED ORDER — MIDAZOLAM HCL 2 MG/2ML IJ SOLN
INTRAMUSCULAR | Status: DC | PRN
  Administered 2020-06-13: 1 mg via INTRAVENOUS

## 2020-06-13 MED ORDER — HEPARIN SODIUM (PORCINE) 1000 UNIT/ML IJ SOLN
INTRAMUSCULAR | Status: AC
  Filled 2020-06-13: qty 1

## 2020-06-13 MED ORDER — VERAPAMIL HCL 2.5 MG/ML IV SOLN
INTRAVENOUS | Status: AC
  Filled 2020-06-13: qty 2

## 2020-06-13 MED ORDER — FENTANYL CITRATE (PF) 100 MCG/2ML IJ SOLN
INTRAMUSCULAR | Status: DC | PRN
  Administered 2020-06-13: 25 ug via INTRAVENOUS

## 2020-06-13 MED ORDER — SODIUM CHLORIDE 0.9% FLUSH
3.0000 mL | Freq: Two times a day (BID) | INTRAVENOUS | Status: DC
  Administered 2020-06-13 – 2020-06-14 (×3): 3 mL via INTRAVENOUS

## 2020-06-13 MED ORDER — HEPARIN (PORCINE) IN NACL 2000-0.9 UNIT/L-% IV SOLN
INTRAVENOUS | Status: DC | PRN
  Administered 2020-06-13: 1000 mL

## 2020-06-13 MED ORDER — MIDAZOLAM HCL 2 MG/2ML IJ SOLN
INTRAMUSCULAR | Status: AC
  Filled 2020-06-13: qty 2

## 2020-06-13 MED ORDER — HEPARIN (PORCINE) 25000 UT/250ML-% IV SOLN
2200.0000 [IU]/h | INTRAVENOUS | Status: DC
  Administered 2020-06-13 (×2): 2050 [IU]/h via INTRAVENOUS
  Administered 2020-06-14: 2200 [IU]/h via INTRAVENOUS
  Filled 2020-06-13 (×2): qty 250

## 2020-06-13 MED ORDER — HEPARIN (PORCINE) IN NACL 1000-0.9 UT/500ML-% IV SOLN
INTRAVENOUS | Status: AC
  Filled 2020-06-13: qty 1000

## 2020-06-13 MED ORDER — SODIUM CHLORIDE 0.9 % IV SOLN
250.0000 mL | INTRAVENOUS | Status: DC | PRN
  Administered 2020-06-13: 250 mL via INTRAVENOUS

## 2020-06-13 MED ORDER — SODIUM CHLORIDE 0.9 % IV SOLN: INTRAVENOUS | Status: DC

## 2020-06-13 MED ORDER — HEPARIN SODIUM (PORCINE) 1000 UNIT/ML IJ SOLN
INTRAMUSCULAR | Status: DC | PRN
  Administered 2020-06-13: 5000 [IU] via INTRAVENOUS

## 2020-06-13 MED ORDER — VERAPAMIL HCL 2.5 MG/ML IV SOLN
INTRAVENOUS | Status: DC | PRN
  Administered 2020-06-13: 2.5 mg via INTRA_ARTERIAL

## 2020-06-13 MED ORDER — FENTANYL CITRATE (PF) 100 MCG/2ML IJ SOLN
INTRAMUSCULAR | Status: AC
  Filled 2020-06-13: qty 2

## 2020-06-13 MED ORDER — CARVEDILOL 6.25 MG PO TABS
6.2500 mg | ORAL_TABLET | Freq: Two times a day (BID) | ORAL | Status: DC
  Administered 2020-06-13 – 2020-06-14 (×2): 6.25 mg via ORAL
  Filled 2020-06-13 (×2): qty 1

## 2020-06-13 MED ORDER — MAGNESIUM OXIDE 400 (241.3 MG) MG PO TABS
400.0000 mg | ORAL_TABLET | Freq: Every day | ORAL | Status: DC
  Administered 2020-06-14: 400 mg via ORAL
  Filled 2020-06-13: qty 1

## 2020-06-13 MED ORDER — SODIUM CHLORIDE 0.9% FLUSH
3.0000 mL | Freq: Two times a day (BID) | INTRAVENOUS | Status: DC
  Administered 2020-06-13: 3 mL via INTRAVENOUS

## 2020-06-13 SURGICAL SUPPLY — 11 items
CATH BALLN WEDGE 5F 110CM (CATHETERS) ×1 IMPLANT
CATH INFINITI 5FR JK (CATHETERS) ×1 IMPLANT
DEVICE RAD COMP TR BAND LRG (VASCULAR PRODUCTS) ×1 IMPLANT
GLIDESHEATH SLEND SS 6F .021 (SHEATH) ×1 IMPLANT
GUIDEWIRE INQWIRE 1.5J.035X260 (WIRE) IMPLANT
INQWIRE 1.5J .035X260CM (WIRE) ×2
PACK CARDIAC CATH (CUSTOM PROCEDURE TRAY) ×2 IMPLANT
PROTECTION STATION PRESSURIZED (MISCELLANEOUS) ×2
SET ATX SIMPLICITY (MISCELLANEOUS) ×1 IMPLANT
SHEATH GLIDE SLENDER 4/5FR (SHEATH) ×1 IMPLANT
STATION PROTECTION PRESSURIZED (MISCELLANEOUS) IMPLANT

## 2020-06-13 NOTE — Progress Notes (Signed)
[place] for heparin infusion  Indication: chest pain/ACS/PE  Allergies  Allergen Reactions  . Metformin And Related Nausea And Vomiting  . Prednisone Other (See Comments)    Reaction: Hallucinations    . Zantac [Ranitidine Hcl] Diarrhea and Nausea Only    Night sweats    Patient Measurements: Height: 6' (182.9 cm) Weight: 108.9 kg (240 lb) IBW/kg (Calculated) : 77.6 Heparin Dosing Weight: 102  Vital Signs: Temp: 97.9 F (36.6 C) (10/26 0929) Temp Source: Oral (10/26 0741) BP: 113/75 (10/26 1230) Pulse Rate: 65 (10/26 1230)  Labs: Recent Labs    06/11/20 0707 06/11/20 0919 06/11/20 0919 06/11/20 1625 06/12/20 0019 06/12/20 0427 06/12/20 0427 06/12/20 1048 06/12/20 2009 06/13/20 0424  HGB 16.6  --    < >  --   --  16.1  --   --   --  17.0  HCT 51.5  --   --   --   --  49.0  --   --   --  51.7  PLT 219  --   --   --   --  206  --   --   --  179  APTT  --  30   < > 80* 68*  --   --  51*  --   --   LABPROT  --  13.9  --   --   --   --   --   --   --   --   INR  --  1.1  --   --   --   --   --   --   --   --   HEPARINUNFRC  --  0.56   < >  --   --  0.45   < > 0.27* 0.30 0.26*  CREATININE 1.44*  --    < >  --   --  1.50*  --   --  1.44* 1.40*  TROPONINIHS 760* 679*  --   --   --   --   --   --   --   --    < > = values in this interval not displayed.    Estimated Creatinine Clearance: 79.6 mL/min (A) (by C-G formula based on SCr of 1.4 mg/dL (H)).   Medical History: Past Medical History:  Diagnosis Date  . Chest wall pain, chronic   . Chronic systolic CHF (congestive heart failure) ([place])   . Chronic Troponin Elevation   . CKD (chronic kidney disease), stage II-III   . COPD (chronic obstructive pulmonary disease) ([place])   . Diabetes mellitus without complication ([place])   . Hypertension    Resolved since weight loss  . NICM (nonischemic cardiomyopathy) ([place])    a. 03/2015 Echo: EF 45-50%; b. 12/2015 Echo: EF 20-25%; c.  02/2016 Echo: EF 30-35%; d. 11/2016 Echo: EF 40-45%; e. 06/2019 Echo: EF 30-35%; f. 11/2019 Echo: EF 25-30%, glob HK. Mild LVH. Gr2 DD. Mild-mod AI. BAE. Triv MR.  . Non-obstructive CAD (coronary artery disease)    a. 04/2015 low risk MV;  b. 12/2016 Cath: minor irregs in LAD/Diag/LCX/OM, RCA 40p/m/d.  [name] (nonsustained ventricular tachycardia) ([place])    a. 12/2015 noted on tele-->amio;  b. 12/2015 Event monitor: no VT. Atach noted.  . Obesity (BMI 30.0-34.9)   . Psoriasis   . Syncope    a. 01/2016 - felt to be vasovagal.  . Tobacco abuse     Assessment: Pharmacy  consulted for heparin infusion dosing and monitoring for 49 yo male admitted with ACS/STEMI. Patient reports ongoing chest pain over the past week. He has PMH of CHF, CKD, COPD, DM, HTN, and PE. He was recently discharged from the hospital on 10/22 after admission for PEs. Patient was started on Xarelto during his inpatient visit, but did not pick up his prescription from the pharmacy. Last dose of Xarelto 10/22.   Goal of Therapy:  Heparin level 0.3-0.7 units/ml aPTT 66-102 seconds Monitor platelets by anticoagulation protocol: Yes  10/24 - heparin 5000 units x 1, 1800 units/hr (based on previous visit) 10/24 @ 1625 APTT 80 - continue 1800 units/hr.  10/25 @ 0019 aPTT 68 Heparin level 0.45  10/25 @ 1048 aPTT 51 Heparin level 0.27 - increase heparin to 1900 units/hr. 10/25 @ 2009 HL 0.30 (drawn ~ 5 hours after rate change) 10/26 @ 0424 HL 0.26 subtherapeutic - Increase heparin to 2050 units/hr    Plan: Pt post cath procedure.  Heparin order to begin 2 hours following T-band removal (removed at 1245).  Ordered heparin drip to restart at 2050 units/hr to start at 1445. Discussed plan with RN.  Rechecking HL at 2100. CBC ordered for AM.  Lonell Face, PharmD 06/13/2020  1:30 PM

## 2020-06-13 NOTE — Progress Notes (Signed)
[place] for heparin infusion  Indication: chest pain/ACS/PE  Allergies  Allergen Reactions  . Metformin And Related Nausea And Vomiting  . Prednisone Other (See Comments)    Reaction: Hallucinations    . Zantac [Ranitidine Hcl] Diarrhea and Nausea Only    Night sweats    Patient Measurements: Height: 6' 0.01" (182.9 cm) Weight: 108.7 kg (239 lb 9.6 oz) IBW/kg (Calculated) : 77.62 Heparin Dosing Weight: 102  Vital Signs: Temp: 98.2 F (36.8 C) (10/26 0436) Temp Source: Oral (10/25 1951) BP: 120/74 (10/26 0436) Pulse Rate: 66 (10/26 0436)  Labs: Recent Labs    06/11/20 0707 06/11/20 0919 06/11/20 0919 06/11/20 1625 06/12/20 0019 06/12/20 0427 06/12/20 0427 06/12/20 1048 06/12/20 2009 06/13/20 0424  HGB 16.6  --    < >  --   --  16.1  --   --   --  17.0  HCT 51.5  --   --   --   --  49.0  --   --   --  51.7  PLT 219  --   --   --   --  206  --   --   --  179  APTT  --  30   < > 80* 68*  --   --  51*  --   --   LABPROT  --  13.9  --   --   --   --   --   --   --   --   INR  --  1.1  --   --   --   --   --   --   --   --   HEPARINUNFRC  --  0.56   < >  --   --  0.45   < > 0.27* 0.30 0.26*  CREATININE 1.44*  --   --   --   --  1.50*  --   --  1.44*  --   TROPONINIHS 760* 679*  --   --   --   --   --   --   --   --    < > = values in this interval not displayed.    Estimated Creatinine Clearance: 77.3 mL/min (A) (by C-G formula based on SCr of 1.44 mg/dL (H)).   Medical History: Past Medical History:  Diagnosis Date  . Chest wall pain, chronic   . Chronic systolic CHF (congestive heart failure) ([place])   . Chronic Troponin Elevation   . CKD (chronic kidney disease), stage II-III   . COPD (chronic obstructive pulmonary disease) ([place])   . Diabetes mellitus without complication ([place])   . Hypertension    Resolved since weight loss  . NICM (nonischemic cardiomyopathy) ([place])    a. 03/2015 Echo: EF 45-50%; b. 12/2015 Echo: EF  20-25%; c. 02/2016 Echo: EF 30-35%; d. 11/2016 Echo: EF 40-45%; e. 06/2019 Echo: EF 30-35%; f. 11/2019 Echo: EF 25-30%, glob HK. Mild LVH. Gr2 DD. Mild-mod AI. BAE. Triv MR.  . Non-obstructive CAD (coronary artery disease)    a. 04/2015 low risk MV;  b. 12/2016 Cath: minor irregs in LAD/Diag/LCX/OM, RCA 40p/m/d.  [name] (nonsustained ventricular tachycardia) ([place])    a. 12/2015 noted on tele-->amio;  b. 12/2015 Event monitor: no VT. Atach noted.  . Obesity (BMI 30.0-34.9)   . Psoriasis   . Syncope    a. 01/2016 - felt to be vasovagal.  . Tobacco abuse  Assessment: Pharmacy consulted for heparin infusion dosing and monitoring for 49 yo male admitted with ACS/STEMI. Patient reports ongoing chest pain over the past week. He has PMH of CHF, CKD, COPD, DM, HTN, and PE. He was recently discharged from the hospital on 10/22 after admission for PEs. Patient was started on Xarelto during his inpatient visit, but did not pick up his prescription from the pharmacy. Last dose of Xarelto 10/22.   Goal of Therapy:  Heparin level 0.3-0.7 units/ml aPTT 66-102 seconds Monitor platelets by anticoagulation protocol: Yes  10/24 - heparin 5000 units x 1, 1800 units/hr (based on previous visit) 10/24 @ 1625 APTT 80 - continue 1800 units/hr.  10/25 @ 0019 aPTT 68 Heparin level 0.45  10/25 @ 1048 aPTT 51 Heparin level 0.27 - increase heparin to 1900 units/hr. 10/25 @ 2009 HL 0.30 (drawn ~ 5 hours after rate change) 10/26 @ 0424 HL 0.26 subtherapeutic  Plan:  Heparin level subtherapeutic. Increase heparin to 2050 units/hr and recheck HL at 1300.  Tawnya Crook, PharmD 06/13/2020 5:25 AM

## 2020-06-13 NOTE — Progress Notes (Signed)
Pt TR band removed. No bleeding at this time.

## 2020-06-13 NOTE — Progress Notes (Signed)
Inpatient Diabetes Program Recommendations  AACE/ADA: New Consensus Statement on Inpatient Glycemic Control (2015)  Target Ranges:  Prepandial:   less than 140 mg/dL      Peak postprandial:   less than 180 mg/dL (1-2 hours)      Critically ill patients:  140 - 180 mg/dL   Lab Results  Component Value Date   GLUCAP 189 (H) 06/13/2020   HGBA1C 9.3 (H) 06/07/2020    Review of Glycemic Control Results for Gabriel [name], Gabriel [name] (MRN [id number]) as of 06/13/2020 10:45  Ref. Range 06/12/2020 07:40 06/12/2020 11:36 06/12/2020 16:38 06/12/2020 21:19 06/13/2020 07:44  Glucose-Capillary Latest Ref Range: 70 - 99 mg/dL 162 (H) 205 (H) 249 (H) 205 (H) 189 (H)   Diabetes history: DM 2 Outpatient Diabetes medications:  Basaglar 28 units q HS Humalog 3 units tid with meals Current orders for Inpatient glycemic control:  Novolog sensitive tid with meals and HS Lantus 19 units daily Inpatient Diabetes Program Recommendations:    Consider increasing Lantus to 25 units daily.  Once patient is eating, consider also adding Novolog 3 units tid with meals (hold if patient eats less than 50% or NPO).   Thanks, Adah Perl, RN, BC-ADM Inpatient Diabetes Coordinator Pager [phone] (8a-5p)

## 2020-06-13 NOTE — Progress Notes (Signed)
Progress Note  Patient Name: Gabriel [name]. Date of Encounter: 06/13/2020  Primary Cardiologist: Rockey Situ  Subjective   Chest discomfort and breathing improved and close to baseline. Though his chest discomfort is improved, it is not resolved. He indicates he has not been chest pain free since 2016. Documented UOP of 3 L over the past 24 hours with minimal input noted. Net negative 6.2 L for the admission. Weight 114.3-->108.7 kg. He remains on heparin gtt. Renal function pending. Vitals stable.   Inpatient Medications    Scheduled Meds: . aspirin EC  81 mg Oral Daily  . benzonatate  100 mg Oral TID  . carvedilol  25 mg Oral BID WC  . dextromethorphan-guaiFENesin  1 tablet Oral BID  . furosemide  40 mg Intravenous Q12H  . insulin aspart  0-5 Units Subcutaneous QHS  . insulin aspart  0-9 Units Subcutaneous TID WC  . insulin glargine  19 Units Subcutaneous Daily  . ipratropium-albuterol  3 mL Inhalation Q6H  . methocarbamol  500 mg Oral TID  . rosuvastatin  20 mg Oral QHS  . sacubitril-valsartan  1 tablet Oral BID  . spironolactone  25 mg Oral BID   Continuous Infusions: . heparin 2,050 Units/hr (06/13/20 8469)   PRN Meds: acetaminophen, albuterol, hydrALAZINE, menthol-cetylpyridinium, morphine injection, nitroGLYCERIN, ondansetron (ZOFRAN) IV   Vital Signs    Vitals:   06/12/20 2118 06/13/20 0239 06/13/20 0300 06/13/20 0436  BP:    120/74  Pulse:    66  Resp:    16  Temp:    98.2 F (36.8 C)  TempSrc:      SpO2: 98% 95%  97%  Weight:   108.7 kg   Height:        Intake/Output Summary (Last 24 hours) at 06/13/2020 0736 Last data filed at 06/13/2020 6295 Gross per 24 hour  Intake 595.11 ml  Output 3600 ml  Net -3004.89 ml   Filed Weights   06/11/20 1607 06/12/20 0339 06/13/20 0300  Weight: 116.9 kg 114.3 kg 108.7 kg    Telemetry    SR with 2 episodes of NSVT lasting 7 and 9 beats, ventricular couplets/triplets - Personally Reviewed  ECG    No  new tracings - Personally Reviewed  Physical Exam   GEN: No acute distress.   Neck: No JVD. Cardiac: RRR, no murmurs, rubs, or gallops.  Respiratory: Faint crackles along the bilateral bases.  GI: Soft, nontender, non-distended.   MS: No edema; No deformity. Neuro:  Alert and oriented x 3; Nonfocal.  Psych: Normal affect.  Labs    Chemistry Recent Labs  Lab 06/07/20 1831 06/08/20 0420 06/11/20 0707 06/12/20 0427 06/12/20 2009  NA  --    < > 138 140 136  K  --    < > 4.7 4.2 3.8  CL  --    < > 104 101 98  CO2  --    < > 23 29 27   GLUCOSE  --    < > 237* 187* 219*  BUN  --    < > 30* 31* 33*  CREATININE  --    < > 1.44* 1.50* 1.44*  CALCIUM  --    < > 9.8 9.4 8.6*  PROT 6.3*  --   --   --   --   ALBUMIN 3.2*  --   --   --   --   AST 25  --   --   --   --  ALT 32  --   --   --   --   ALKPHOS 46  --   --   --   --   BILITOT 2.2*  --   --   --   --   GFRNONAA  --    < > 59* 56* 59*  ANIONGAP  --    < > 11 10 11    < > = values in this interval not displayed.     Hematology Recent Labs  Lab 06/11/20 0707 06/12/20 0427 06/13/20 0424  WBC 11.8* 10.5 8.8  RBC 5.93* 5.69 6.05*  HGB 16.6 16.1 17.0  HCT 51.5 49.0 51.7  MCV 86.8 86.1 85.5  MCH 28.0 28.3 28.1  MCHC 32.2 32.9 32.9  RDW 14.0 14.0 13.7  PLT 219 206 179    Cardiac EnzymesNo results for input(s): TROPONINI in the last 168 hours. No results for input(s): TROPIPOC in the last 168 hours.   BNP Recent Labs  Lab 06/07/20 1144 06/11/20 0707 06/12/20 1723  BNP 1,566.8* 1,553.1* 917.0*     DDimer No results for input(s): DDIMER in the last 168 hours.   Radiology    DG Chest 2 View  Result Date: 06/11/2020 IMPRESSION: 1. Stable interstitial prominence bilaterally, suggesting chronic mild CHF/volume overload. 2. No evidence of superimposed pneumonia. 3. Stable cardiomegaly. Electronically Signed   By: Franki Cabot M.D.   On: 06/11/2020 07:47    Cardiac Studies   2D echo 06/08/2020: 1. Left  ventricular ejection fraction, by estimation, is 35 to 40%. The  left ventricle has moderately decreased function. The left ventricle  demonstrates global hypokinesis. The left ventricular internal cavity size  was moderately dilated. Left  ventricular diastolic parameters are indeterminate.  2. Right ventricular systolic function is normal. The right ventricular  size is normal. There is mildly elevated pulmonary artery systolic  pressure. The estimated right ventricular systolic pressure is 31.5 mmHg.  3. Left atrial size was moderately dilated.  4. Mild to moderate mitral valve regurgitation. __________  2D echo 11/2019: 1. Left ventricular ejection fraction, by estimation, is 25 to 30%. The  left ventricle has severely decreased function. The left ventricle  demonstrates global hypokinesis. The left ventricular internal cavity size  was mildly dilated. There is mild left  ventricular hypertrophy. Left ventricular diastolic parameters are  consistent with Grade III diastolic dysfunction (restrictive). Elevated  left atrial pressure.  2. Right ventricular systolic function is moderately reduced. The right  ventricular size is mildly enlarged. There is moderately elevated  pulmonary artery systolic pressure.  3. Left atrial size was mild to moderately dilated.  4. Right atrial size was mildly dilated.  5. The mitral valve is normal in structure. Trivial mitral valve  regurgitation. No evidence of mitral stenosis.  6. The aortic valve is tricuspid. Aortic valve regurgitation is mild to  moderate. Mild aortic valve sclerosis is present, with no evidence of  aortic valve stenosis.  7. Aortic dilatation noted. There is borderline dilatation of the aortic  root measuring 38 mm.  8. The inferior vena cava is dilated in size with <50% respiratory  variability, suggesting right atrial pressure of 15 mmHg.  __________  LHC 12/2016:  There is moderate to severe left ventricular  systolic dysfunction.  LV end diastolic pressure is moderately elevated.  The left ventricular ejection fraction is 25-35% by visual estimate.  Dist RCA lesion, 40 %stenosed.  Prox RCA to Mid RCA lesion, 40 %stenosed.   1. Mild nonobstructive coronary  artery disease. 2. Moderately to severely reduced LV systolic function with an EF of 30-35%. 3. Moderately elevated left ventricular end-diastolic pressure. LVEDP was in the high 20s.  Recommendations: Continue medical therapy for nonobstructive coronary artery disease. The patient has nonischemic cardiomyopathy. He is volume overloaded and I increased torsemide to 40 mg by mouth twice daily. Consider adding spironolactone or eplerenone in the near future   Patient Profile     49 y.o. 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  Assessment & Plan    1. Elevated HS-Tn: -Peaked at 760, now down trending -Initially felt to be secondary to supply demand ischemia -EKG not consistent with ACS -Prior LHC showed nonobstructive disease as outlined above -Given this is his 2nd admission within a week and more of a troponin bump this admission, we will proceed with a R/LHC this morning to evaluate for progression of his CAD and further evaluate his hemodynamics to assist with ongoing diuresis  -Echo earlier this month with improved LVSF as above -Can transition from IV heparin back to PTA Xarelto once it is clear inpatient invasive testing will not be needed  -When [facility] is resumed can stop ASA -Coreg -Crestor  -Risks and benefits of cardiac catheterization have been discussed with the patient including risks of bleeding, bruising, infection, kidney damage, stroke, heart attack, and death. The patient understands these risks and is willing to  proceed with the procedure. All questions have been answered and concerns listened to  2. Acute on chronic HFrEF secondary to dilated NICM: -Patient recently admitted from 10/20-10/22 with return to the hospital on 10/24 with continued dyspnea -Echo earlier this month with an improved LVSF with an EF of 35-40% -R/LHC today as above -Continue to follow up with the advanced heart failure clinic  -Medication adherence appears to be a significant issue -Continue Entresto, Coreg, and spironolactone  -CHF education -Daily weights -Strict I/O  3. NSVT: -Asymptomatic  -Coreg  -Replete potassium, post cath, and magnesium to goal 4.0 and 2.0 respectively  -Last echo with improved cardiomyopathy as above  4. DVT/PE: -Currently on heparin  -Resume Xarelto prior to discharge   5. CKD stage III: -Relatively stable, pending this morning  -Monitor  6. HTN: -Blood pressure much improved and currently well controlled -Continue medications as above  7. HLD: -LDL 75 this admission -Crestor   For questions or updates, please contact [place] Please consult [email or web address] for contact info under Cardiology/STEMI.    Signed, Christell Faith, PA-C [place] Pager: [phone] 06/13/2020, 7:36 AM

## 2020-06-13 NOTE — Progress Notes (Signed)
[facility] Progress Note  Patient: Gabriel [name].    HYQ:[id number]  DOA: 06/11/2020     Date of Service: the patient was seen and examined on 06/13/2020  Brief hospital course: Gabriel [name]. is a 49 y.o. male with medical history significant of hx of CAD, HLD, HTN, DM, COPD, PE not taking his Xarelto, sCHF, CKD-III, NSVT, former smoker, presents with CP and SOB.   Currently plan is treat heart failure and further work-up per cardiology.  Assessment and Plan: Elevated troponin.  From demand ischemia No evidence of non-STEMI. hx of Coronary artery disease, non-occlusive: trop 760. Dr. Percival Spanish for cardiology is consulted Currently on IV heparin. UDS negative. Right and left heart cath performed by cardiology. Cardiac output is low. Severe triple-vessel disease with chronic RCA CTO with collaterals. Conservative management recommended by cardiology. IV heparin for now as well.  P.o. DOAC tomorrow.  Chest pain and chest heaviness. Acute on chronic systolic CHF. Volume overload is likely associated with patient's complaint. Currently receiving IV Lasix. Monitor renal function. Also on Entresto carvedilol and Aldactone. Cardiology considering outpatient Farxiga SP cardiac catheterization.  Essential hypertension: IV hydralazine as needed -Continue Entresto, Coreg -will not restart clonidine  Hyperlipidemia -Crestor  CKD-IIIa:  Recent baseline creatinine 1.26 on 10/21/202.  His creatinine is 1.44, BUN 30, slightly worsening than baseline. Monitor post cath.  Type II diabetes mellitus with renal manifestations Gabriel [name]): Recent A1c 9.3, poorly controlled -SSI -Decreased glargine insulin dose from 28 to 19 units daily  COPD (chronic obstructive pulmonary disease) ([place]): No wheezing on auscultation -Continue bronchodilators  Leukocytosis: Mild leukocytosis with WBC 11.8, but no fever.  No source of infection, likely reactive. -Follow-up by CBC  PE  (pulmonary thromboembolism) ([place]): Not compliance to Xarelto -Currently on IV heparin  Obesity Likely part of patient's complaint of chest heaviness. Body mass index is 32.55 kg/m.   Leg cramps. Improving with Robaxin.  Diet: Cardiac diet DVT Prophylaxis: Therapeutic Anticoagulation with Heparin   Advance goals of care discussion: Full code  Family Communication: no family was present at bedside, at the time of interview.   Disposition:  Status is: Inpatient  Remains inpatient appropriate because:IV treatments appropriate due to intensity of illness or inability to take PO   Dispo: The patient is from: Home              Anticipated d/c is to: Home              Anticipated d/c date is: 2 days              Patient currently is not medically stable to d/c.  Subjective: Reported severe leg cramps.  Currently no nausea no vomiting but no fever no chills.  No chest pain.  Breathing improving.  Physical Exam:  General: Appear in mild distress, no Rash; Oral Mucosa Clear, moist. no Abnormal Neck Mass Or lumps, Conjunctiva normal  Cardiovascular: S1 and S2 Present, no Murmur, Respiratory: increased respiratory effort, Bilateral Air entry present and CTA, no Crackles, no wheezes Abdomen: Bowel Sound present, Soft and no tenderness Extremities: bilateral  Pedal edema Neurology: alert and oriented to time, place, and person affect appropriate. no new focal deficit Gait not checked due to patient safety concerns  Vitals:   06/13/20 1215 06/13/20 1230 06/13/20 1314 06/13/20 1654  BP:  113/75 117/80 130/88  Pulse: 62 65 72 73  Resp: 15  20 16   Temp:    97.9 F (36.6 C)  TempSrc:  Oral  SpO2: 95% 94% 93% 97%  Weight:      Height:        Intake/Output Summary (Last 24 hours) at 06/13/2020 1747 Last data filed at 06/13/2020 1500 Gross per 24 hour  Intake 845.21 ml  Output 2200 ml  Net -1354.79 ml   Filed Weights   06/12/20 0339 06/13/20 0300 06/13/20 0929  Weight:  114.3 kg 108.7 kg 108.9 kg    Data Reviewed: I have personally reviewed and interpreted daily labs, tele strips, imagings as discussed above. I reviewed all nursing notes, pharmacy notes, vitals, pertinent old records I have discussed plan of care as described above with RN and patient/family.  CBC: Recent Labs  Lab 06/08/20 0420 06/09/20 0514 06/11/20 0707 06/12/20 0427 06/13/20 0424  WBC 8.5 7.0 11.8* 10.5 8.8  HGB 14.6 14.4 16.6 16.1 17.0  HCT 44.2 43.7 51.5 49.0 51.7  MCV 85.8 85.9 86.8 86.1 85.5  PLT 186 175 219 206 865   Basic Metabolic Panel: Recent Labs  Lab 06/07/20 1831 06/08/20 0420 06/09/20 0514 06/11/20 0707 06/12/20 0427 06/12/20 2009 06/13/20 0424  NA  --    < > 138 138 140 136 137  K  --    < > 3.8 4.7 4.2 3.8 3.9  CL  --    < > 100 104 101 98 98  CO2  --    < > 27 23 29 27 27   GLUCOSE  --    < > 169* 237* 187* 219* 214*  BUN  --    < > 32* 30* 31* 33* 29*  CREATININE  --    < > 1.62* 1.44* 1.50* 1.44* 1.40*  CALCIUM  --    < > 9.2 9.8 9.4 8.6* 9.0  MG 1.8  --   --   --  1.8 1.7  --    < > = values in this interval not displayed.    Studies: CARDIAC CATHETERIZATION  Result Date: 06/13/2020 Conclusions: 1. Severe single-vessel coronary artery disease with chronic total occlusion of the proximal RCA.  The distal vessel fills via left to right collaterals. 2. Mild to moderate, nonobstructive CAD involving the LAD and LCx with up to 50% stenosis. 3. Mildly elevated left and right heart filling pressures. 4. Moderate pulmonary hypertension (mean PAP 40 mmHg) with significantly elevated pulmonary vascular resistance (PVR 7.4 WU). 5. Severely reduced Fick cardiac output/index. Recommendations: 1. Continue IV diuresis for at least 1 more day. 2. Given severely reduced cardiac output, I will decrease carvedilol to 6.5 mg twice daily.  If blood pressure tolerates and renal function is stable, consider further escalation of Entresto. 3. Restart IV heparin 2 hours  after TR band removal.  If no evidence of bleeding or vascular complication, the patient can be transitioned back to [name] tomorrow. 4. 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  He was evaluated by Dr. Aundra Dubin in June but failed to follow-up since then. 5. Aggressive secondary prevention of coronary artery disease.  Favor medical management of CTO of RCA, which is new since 2018. Nelva Bush, MD [facility]    Scheduled Meds: . aspirin EC  81 mg Oral Daily  . benzonatate  100 mg Oral TID  . carvedilol  6.25 mg Oral BID WC  . dextromethorphan-guaiFENesin  1 tablet Oral BID  . furosemide  40 mg Intravenous Q12H  . insulin aspart  0-5  Units Subcutaneous QHS  . insulin aspart  0-9 Units Subcutaneous TID WC  . insulin glargine  19 Units Subcutaneous Daily  . ipratropium-albuterol  3 mL Inhalation Q6H  . magnesium oxide  400 mg Oral Daily  . methocarbamol  500 mg Oral TID  . rosuvastatin  20 mg Oral QHS  . sacubitril-valsartan  1 tablet Oral BID  . sodium chloride flush  3 mL Intravenous Q12H  . spironolactone  25 mg Oral BID   Continuous Infusions: . sodium chloride    . heparin 2,050 Units/hr (06/13/20 1500)   PRN Meds: sodium chloride, acetaminophen, albuterol, hydrALAZINE, menthol-cetylpyridinium, morphine injection, nitroGLYCERIN, ondansetron (ZOFRAN) IV, sodium chloride flush  Time spent: 35 minutes  Author: Berle Mull, MD [facility] 06/13/2020 5:47 PM  To reach On-call, see care teams to locate the attending and reach out via [email or web address]. Between 7PM-7AM, please contact night-coverage If you still have difficulty reaching the attending provider, please page the [name] (Director on Call) for [facility] on amion for assistance.

## 2020-06-13 NOTE — Progress Notes (Signed)
ANTICOAGULATION CONSULT NOTE   Pharmacy Consult for heparin infusion  Indication: chest pain/ACS/PE  Patient Measurements: Height: 6' (182.9 cm) Weight: 108.9 kg (240 lb) IBW/kg (Calculated) : 77.6 Heparin Dosing Weight: 102  Labs: Recent Labs    06/11/20 0707 06/11/20 0919 06/11/20 0919 06/11/20 1625 06/12/20 0019 06/12/20 0427 06/12/20 0427 06/12/20 1048 06/12/20 1048 06/12/20 2009 06/13/20 0424 06/13/20 2122  HGB 16.6  --    < >  --   --  16.1  --   --   --   --  17.0  --   HCT 51.5  --   --   --   --  49.0  --   --   --   --  51.7  --   PLT 219  --   --   --   --  206  --   --   --   --  179  --   APTT  --  30   < > 80* 68*  --   --  51*  --   --   --   --   LABPROT  --  13.9  --   --   --   --   --   --   --   --   --   --   INR  --  1.1  --   --   --   --   --   --   --   --   --   --   HEPARINUNFRC  --  0.56   < >  --   --  0.45   < > 0.27*   < > 0.30 0.26* 0.29*  CREATININE 1.44*  --    < >  --   --  1.50*  --   --   --  1.44* 1.40*  --   TROPONINIHS 760* 679*  --   --   --   --   --   --   --   --   --   --    < > = values in this interval not displayed.    Estimated Creatinine Clearance: 79.6 mL/min (A) (by C-G formula based on SCr of 1.4 mg/dL (H)).   Medical History: Past Medical History:  Diagnosis Date  . Chest wall pain, chronic   . Chronic systolic CHF (congestive heart failure) ([place])   . Chronic Troponin Elevation   . CKD (chronic kidney disease), stage II-III   . COPD (chronic obstructive pulmonary disease) ([place])   . Diabetes mellitus without complication ([place])   . Hypertension    Resolved since weight loss  . NICM (nonischemic cardiomyopathy) ([place])    a. 03/2015 Echo: EF 45-50%; b. 12/2015 Echo: EF 20-25%; c. 02/2016 Echo: EF 30-35%; d. 11/2016 Echo: EF 40-45%; e. 06/2019 Echo: EF 30-35%; f. 11/2019 Echo: EF 25-30%, glob HK. Mild LVH. Gr2 DD. Mild-mod AI. BAE. Triv MR.  . Non-obstructive CAD (coronary artery disease)    a. 04/2015 low risk MV;  b.  12/2016 Cath: minor irregs in LAD/Diag/LCX/OM, RCA 40p/m/d.  [name] (nonsustained ventricular tachycardia) ([place])    a. 12/2015 noted on tele-->amio;  b. 12/2015 Event monitor: no VT. Atach noted.  . Obesity (BMI 30.0-34.9)   . Psoriasis   . Syncope    a. 01/2016 - felt to be vasovagal.  . Tobacco abuse     Assessment: Pharmacy consulted for heparin infusion dosing and monitoring for 49  yo male admitted with ACS/STEMI. Patient reports ongoing chest pain over the past week. He has PMH of CHF, CKD, COPD, DM, HTN, and PE. He was recently discharged from the hospital on 10/22 after admission for PEs. Patient was started on Xarelto during his inpatient visit, but did not pick up his prescription from the pharmacy. Last dose of Xarelto 10/22.   Goal of Therapy:  Heparin level 0.3-0.7 units/ml aPTT 66-102 seconds Monitor platelets by anticoagulation protocol: Yes  10/24 - heparin 5000 units x 1, 1800 units/hr (based on previous visit) 10/24 @ 1625 APTT 80 - continue 1800 units/hr.  10/25 @ 0019 aPTT 68 Heparin level 0.45  10/25 @ 1048 aPTT 51 Heparin level 0.27 - increase heparin to 1900 units/hr. 10/25 @ 2009 HL 0.30 (drawn ~ 5 hours after rate change) 10/26 @ 0424 HL 0.26 subtherapeutic - Increase heparin to 2050 units/hr  10/26 @ 2122 HL 0.29, subtherapeutic -Increase heparin to 2200 units/hr  Plan:  --Heparin level is slightly subtherapeutic. Will increase heparin to 2200 units/hr --Re-check HL 6 hours after rate change --Daily CBC per protocol  Benita Gutter 06/13/2020  9:52 PM

## 2020-06-13 NOTE — Plan of Care (Signed)
°  Problem: Education: °Goal: Knowledge of General Education information will improve °Description: Including pain rating scale, medication(s)/side effects and non-pharmacologic comfort measures °Outcome: Progressing °  °Problem: Health Behavior/Discharge Planning: °Goal: Ability to manage health-related needs will improve °Outcome: Progressing °  °Problem: Clinical Measurements: °Goal: Ability to maintain clinical measurements within normal limits will improve °Outcome: Progressing °Goal: Will remain free from infection °Outcome: Progressing °Goal: Diagnostic test results will improve °Outcome: Progressing °Goal: Respiratory complications will improve °Outcome: Progressing °Goal: Cardiovascular complication will be avoided °Outcome: Progressing °  °Problem: Activity: °Goal: Risk for activity intolerance will decrease °Outcome: Progressing °  °Problem: Nutrition: °Goal: Adequate nutrition will be maintained °Outcome: Progressing °  °Problem: Coping: °Goal: Level of anxiety will decrease °Outcome: Progressing °  °Problem: Elimination: °Goal: Will not experience complications related to bowel motility °Outcome: Progressing °Goal: Will not experience complications related to urinary retention °Outcome: Progressing °  °Problem: Pain Managment: °Goal: General experience of comfort will improve °Outcome: Progressing °  °Problem: Safety: °Goal: Ability to remain free from injury will improve °Outcome: Progressing °  °Problem: Skin Integrity: °Goal: Risk for impaired skin integrity will decrease °Outcome: Progressing °  °Problem: Education: °Goal: Understanding of cardiac disease, CV risk reduction, and recovery process will improve °Outcome: Progressing °Goal: Individualized Educational Video(s) °Outcome: Progressing °  °Problem: Activity: °Goal: Ability to tolerate increased activity will improve °Outcome: Progressing °  °Problem: Cardiac: °Goal: Ability to achieve and maintain adequate cardiovascular perfusion will  improve °Outcome: Progressing °  °Problem: Health Behavior/Discharge Planning: °Goal: Ability to safely manage health-related needs after discharge will improve °Outcome: Progressing °  °

## 2020-06-13 NOTE — Interval H&P Note (Signed)
History and Physical Interval Note:  06/13/2020 9:45 AM  Gabriel [name].  has presented today for surgery, with the diagnosis of acute on chronic systolic congestive heart failure with elevated troponin and unstable angina.  The various methods of treatment have been discussed with the patient and family. After consideration of risks, benefits and other options for treatment, the patient has consented to  Procedure(s): RIGHT/LEFT HEART CATH AND CORONARY ANGIOGRAPHY (N/A) as a surgical intervention.  The patient's history has been reviewed, patient examined, no change in status, stable for surgery.  I have reviewed the patient's chart and labs.  Questions were answered to the patient's satisfaction.    Cath Lab Visit (complete for each Cath Lab visit)  Clinical Evaluation Leading to the Procedure:   ACS: Yes.    Non-ACS:  N/A  Shailen Thielen

## 2020-06-13 NOTE — H&P (View-Only) (Signed)
Progress Note  Patient Name: Gabriel [name]. Date of Encounter: 06/13/2020  Primary Cardiologist: Rockey Situ  Subjective   Chest discomfort and breathing improved and close to baseline. Though his chest discomfort is improved, it is not resolved. He indicates he has not been chest pain free since 2016. Documented UOP of 3 L over the past 24 hours with minimal input noted. Net negative 6.2 L for the admission. Weight 114.3-->108.7 kg. He remains on heparin gtt. Renal function pending. Vitals stable.   Inpatient Medications    Scheduled Meds: . aspirin EC  81 mg Oral Daily  . benzonatate  100 mg Oral TID  . carvedilol  25 mg Oral BID WC  . dextromethorphan-guaiFENesin  1 tablet Oral BID  . furosemide  40 mg Intravenous Q12H  . insulin aspart  0-5 Units Subcutaneous QHS  . insulin aspart  0-9 Units Subcutaneous TID WC  . insulin glargine  19 Units Subcutaneous Daily  . ipratropium-albuterol  3 mL Inhalation Q6H  . methocarbamol  500 mg Oral TID  . rosuvastatin  20 mg Oral QHS  . sacubitril-valsartan  1 tablet Oral BID  . spironolactone  25 mg Oral BID   Continuous Infusions: . heparin 2,050 Units/hr (06/13/20 4174)   PRN Meds: acetaminophen, albuterol, hydrALAZINE, menthol-cetylpyridinium, morphine injection, nitroGLYCERIN, ondansetron (ZOFRAN) IV   Vital Signs    Vitals:   06/12/20 2118 06/13/20 0239 06/13/20 0300 06/13/20 0436  BP:    120/74  Pulse:    66  Resp:    16  Temp:    98.2 F (36.8 C)  TempSrc:      SpO2: 98% 95%  97%  Weight:   108.7 kg   Height:        Intake/Output Summary (Last 24 hours) at 06/13/2020 0736 Last data filed at 06/13/2020 0814 Gross per 24 hour  Intake 595.11 ml  Output 3600 ml  Net -3004.89 ml   Filed Weights   06/11/20 1607 06/12/20 0339 06/13/20 0300  Weight: 116.9 kg 114.3 kg 108.7 kg    Telemetry    SR with 2 episodes of NSVT lasting 7 and 9 beats, ventricular couplets/triplets - Personally Reviewed  ECG    No  new tracings - Personally Reviewed  Physical Exam   GEN: No acute distress.   Neck: No JVD. Cardiac: RRR, no murmurs, rubs, or gallops.  Respiratory: Faint crackles along the bilateral bases.  GI: Soft, nontender, non-distended.   MS: No edema; No deformity. Neuro:  Alert and oriented x 3; Nonfocal.  Psych: Normal affect.  Labs    Chemistry Recent Labs  Lab 06/07/20 1831 06/08/20 0420 06/11/20 0707 06/12/20 0427 06/12/20 2009  NA  --    < > 138 140 136  K  --    < > 4.7 4.2 3.8  CL  --    < > 104 101 98  CO2  --    < > 23 29 27   GLUCOSE  --    < > 237* 187* 219*  BUN  --    < > 30* 31* 33*  CREATININE  --    < > 1.44* 1.50* 1.44*  CALCIUM  --    < > 9.8 9.4 8.6*  PROT 6.3*  --   --   --   --   ALBUMIN 3.2*  --   --   --   --   AST 25  --   --   --   --  ALT 32  --   --   --   --   ALKPHOS 46  --   --   --   --   BILITOT 2.2*  --   --   --   --   GFRNONAA  --    < > 59* 56* 59*  ANIONGAP  --    < > 11 10 11    < > = values in this interval not displayed.     Hematology Recent Labs  Lab 06/11/20 0707 06/12/20 0427 06/13/20 0424  WBC 11.8* 10.5 8.8  RBC 5.93* 5.69 6.05*  HGB 16.6 16.1 17.0  HCT 51.5 49.0 51.7  MCV 86.8 86.1 85.5  MCH 28.0 28.3 28.1  MCHC 32.2 32.9 32.9  RDW 14.0 14.0 13.7  PLT 219 206 179    Cardiac EnzymesNo results for input(s): TROPONINI in the last 168 hours. No results for input(s): TROPIPOC in the last 168 hours.   BNP Recent Labs  Lab 06/07/20 1144 06/11/20 0707 06/12/20 1723  BNP 1,566.8* 1,553.1* 917.0*     DDimer No results for input(s): DDIMER in the last 168 hours.   Radiology    DG Chest 2 View  Result Date: 06/11/2020 IMPRESSION: 1. Stable interstitial prominence bilaterally, suggesting chronic mild CHF/volume overload. 2. No evidence of superimposed pneumonia. 3. Stable cardiomegaly. Electronically Signed   By: Franki Cabot M.D.   On: 06/11/2020 07:47    Cardiac Studies   2D echo 06/08/2020: 1. Left  ventricular ejection fraction, by estimation, is 35 to 40%. The  left ventricle has moderately decreased function. The left ventricle  demonstrates global hypokinesis. The left ventricular internal cavity size  was moderately dilated. Left  ventricular diastolic parameters are indeterminate.  2. Right ventricular systolic function is normal. The right ventricular  size is normal. There is mildly elevated pulmonary artery systolic  pressure. The estimated right ventricular systolic pressure is 30.8 mmHg.  3. Left atrial size was moderately dilated.  4. Mild to moderate mitral valve regurgitation. __________  2D echo 11/2019: 1. Left ventricular ejection fraction, by estimation, is 25 to 30%. The  left ventricle has severely decreased function. The left ventricle  demonstrates global hypokinesis. The left ventricular internal cavity size  was mildly dilated. There is mild left  ventricular hypertrophy. Left ventricular diastolic parameters are  consistent with Grade III diastolic dysfunction (restrictive). Elevated  left atrial pressure.  2. Right ventricular systolic function is moderately reduced. The right  ventricular size is mildly enlarged. There is moderately elevated  pulmonary artery systolic pressure.  3. Left atrial size was mild to moderately dilated.  4. Right atrial size was mildly dilated.  5. The mitral valve is normal in structure. Trivial mitral valve  regurgitation. No evidence of mitral stenosis.  6. The aortic valve is tricuspid. Aortic valve regurgitation is mild to  moderate. Mild aortic valve sclerosis is present, with no evidence of  aortic valve stenosis.  7. Aortic dilatation noted. There is borderline dilatation of the aortic  root measuring 38 mm.  8. The inferior vena cava is dilated in size with <50% respiratory  variability, suggesting right atrial pressure of 15 mmHg.  __________  LHC 12/2016:  There is moderate to severe left ventricular  systolic dysfunction.  LV end diastolic pressure is moderately elevated.  The left ventricular ejection fraction is 25-35% by visual estimate.  Dist RCA lesion, 40 %stenosed.  Prox RCA to Mid RCA lesion, 40 %stenosed.   1. Mild nonobstructive coronary  artery disease. 2. Moderately to severely reduced LV systolic function with an EF of 30-35%. 3. Moderately elevated left ventricular end-diastolic pressure. LVEDP was in the high 20s.  Recommendations: Continue medical therapy for nonobstructive coronary artery disease. The patient has nonischemic cardiomyopathy. He is volume overloaded and I increased torsemide to 40 mg by mouth twice daily. Consider adding spironolactone or eplerenone in the near future   Patient Profile     49 y.o. 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  Assessment & Plan    1. Elevated HS-Tn: -Peaked at 760, now down trending -Initially felt to be secondary to supply demand ischemia -EKG not consistent with ACS -Prior LHC showed nonobstructive disease as outlined above -Given this is his 2nd admission within a week and more of a troponin bump this admission, we will proceed with a R/LHC this morning to evaluate for progression of his CAD and further evaluate his hemodynamics to assist with ongoing diuresis  -Echo earlier this month with improved LVSF as above -Can transition from IV heparin back to PTA Xarelto once it is clear inpatient invasive testing will not be needed  -When [facility] is resumed can stop ASA -Coreg -Crestor  -Risks and benefits of cardiac catheterization have been discussed with the patient including risks of bleeding, bruising, infection, kidney damage, stroke, heart attack, and death. The patient understands these risks and is willing to  proceed with the procedure. All questions have been answered and concerns listened to  2. Acute on chronic HFrEF secondary to dilated NICM: -Patient recently admitted from 10/20-10/22 with return to the hospital on 10/24 with continued dyspnea -Echo earlier this month with an improved LVSF with an EF of 35-40% -R/LHC today as above -Continue to follow up with the advanced heart failure clinic  -Medication adherence appears to be a significant issue -Continue Entresto, Coreg, and spironolactone  -CHF education -Daily weights -Strict I/O  3. NSVT: -Asymptomatic  -Coreg  -Replete potassium, post cath, and magnesium to goal 4.0 and 2.0 respectively  -Last echo with improved cardiomyopathy as above  4. DVT/PE: -Currently on heparin  -Resume Xarelto prior to discharge   5. CKD stage III: -Relatively stable, pending this morning  -Monitor  6. HTN: -Blood pressure much improved and currently well controlled -Continue medications as above  7. HLD: -LDL 75 this admission -Crestor   For questions or updates, please contact [place] Please consult [email or web address] for contact info under Cardiology/STEMI.    Signed, Christell Faith, PA-C [place] Pager: [phone] 06/13/2020, 7:36 AM

## 2020-06-14 ENCOUNTER — Encounter: Payer: Self-pay | Admitting: Internal Medicine

## 2020-06-14 DIAGNOSIS — I5023 Acute on chronic systolic (congestive) heart failure: Secondary | ICD-10-CM | POA: Diagnosis not present

## 2020-06-14 DIAGNOSIS — I2510 Atherosclerotic heart disease of native coronary artery without angina pectoris: Secondary | ICD-10-CM | POA: Diagnosis not present

## 2020-06-14 LAB — CBC
HCT: 50.4 % (ref 39.0–52.0)
Hemoglobin: 16.6 g/dL (ref 13.0–17.0)
MCH: 28.1 pg (ref 26.0–34.0)
MCHC: 32.9 g/dL (ref 30.0–36.0)
MCV: 85.3 fL (ref 80.0–100.0)
Platelets: 213 10*3/uL (ref 150–400)
RBC: 5.91 MIL/uL — ABNORMAL HIGH (ref 4.22–5.81)
RDW: 13.8 % (ref 11.5–15.5)
WBC: 9.5 10*3/uL (ref 4.0–10.5)
nRBC: 0 % (ref 0.0–0.2)

## 2020-06-14 LAB — GLUCOSE, CAPILLARY
Glucose-Capillary: 243 mg/dL — ABNORMAL HIGH (ref 70–99)
Glucose-Capillary: 239 mg/dL — ABNORMAL HIGH (ref 70–99)

## 2020-06-14 LAB — COMPREHENSIVE METABOLIC PANEL
ALT: 24 U/L (ref 0–44)
AST: 21 U/L (ref 15–41)
Albumin: 3.4 g/dL — ABNORMAL LOW (ref 3.5–5.0)
Alkaline Phosphatase: 51 U/L (ref 38–126)
Anion gap: 8 (ref 5–15)
BUN: 26 mg/dL — ABNORMAL HIGH (ref 6–20)
CO2: 28 mmol/L (ref 22–32)
Calcium: 9 mg/dL (ref 8.9–10.3)
Chloride: 101 mmol/L (ref 98–111)
Creatinine, Ser: 1.18 mg/dL (ref 0.61–1.24)
GFR, Estimated: >60 mL/min (ref >60)
Glucose, Bld: 232 mg/dL — ABNORMAL HIGH (ref 70–99)
Potassium: 4.2 mmol/L (ref 3.5–5.1)
Sodium: 137 mmol/L (ref 135–145)
Total Bilirubin: 1.5 mg/dL — ABNORMAL HIGH (ref 0.3–1.2)
Total Protein: 6.7 g/dL (ref 6.5–8.1)

## 2020-06-14 LAB — HEPARIN LEVEL (UNFRACTIONATED): Heparin Unfractionated: 0.44 IU/mL (ref 0.30–0.70)

## 2020-06-14 LAB — MAGNESIUM: Magnesium: 2 mg/dL (ref 1.7–2.4)

## 2020-06-14 MED ORDER — CARVEDILOL 6.25 MG PO TABS: 6.2500 mg | ORAL_TABLET | Freq: Two times a day (BID) | ORAL | 0 refills | Status: DC

## 2020-06-14 MED ORDER — SACUBITRIL-VALSARTAN 97-103 MG PO TABS
1.0000 | ORAL_TABLET | Freq: Two times a day (BID) | ORAL | Status: DC
  Administered 2020-06-14: 1 via ORAL
  Filled 2020-06-14 (×3): qty 1

## 2020-06-14 MED ORDER — TORSEMIDE 20 MG PO TABS: 60.0000 mg | ORAL_TABLET | Freq: Two times a day (BID) | ORAL | 0 refills | Status: DC

## 2020-06-14 MED ORDER — TORSEMIDE 20 MG PO TABS
60.0000 mg | ORAL_TABLET | Freq: Two times a day (BID) | ORAL | Status: DC
  Administered 2020-06-14: 60 mg via ORAL
  Filled 2020-06-14: qty 3

## 2020-06-14 MED ORDER — ENTRESTO 97-103 MG PO TABS: 1.0000 | ORAL_TABLET | Freq: Two times a day (BID) | ORAL | 0 refills | Status: DC

## 2020-06-14 MED ORDER — RIVAROXABAN 20 MG PO TABS: 20.0000 mg | ORAL_TABLET | Freq: Every day | ORAL | Status: DC

## 2020-06-14 MED ORDER — RIVAROXABAN 15 MG PO TABS
15.0000 mg | ORAL_TABLET | Freq: Two times a day (BID) | ORAL | Status: DC
  Administered 2020-06-14: 15 mg via ORAL
  Filled 2020-06-14 (×2): qty 1

## 2020-06-14 MED ORDER — ASPIRIN 81 MG PO TBEC
81.0000 mg | DELAYED_RELEASE_TABLET | Freq: Every day | ORAL | Status: DC
Start: 2020-06-15 — End: 2020-10-27

## 2020-06-14 NOTE — Progress Notes (Signed)
ANTICOAGULATION CONSULT NOTE   Pharmacy Consult for heparin infusion  Indication: chest pain/ACS/PE  Patient Measurements: Height: 6' (182.9 cm) Weight: 111.2 kg (245 lb 2.4 oz) IBW/kg (Calculated) : 77.6 Heparin Dosing Weight: 102  Labs: Recent Labs    06/11/20 0707 06/11/20 0707 06/11/20 0919 06/11/20 0919 06/11/20 1625 06/12/20 0019 06/12/20 0427 06/12/20 0427 06/12/20 1048 06/12/20 1048 06/12/20 2009 06/12/20 2009 06/13/20 0424 06/13/20 2122 06/14/20 0440  HGB 16.6   < >  --   --   --   --  16.1   < >  --   --   --   --  17.0  --  16.6  HCT 51.5   < >  --   --   --   --  49.0  --   --   --   --   --  51.7  --  50.4  PLT 219   < >  --   --   --   --  206  --   --   --   --   --  179  --  213  APTT  --   --  30   < > 80* 68*  --   --  51*  --   --   --   --   --   --   LABPROT  --   --  13.9  --   --   --   --   --   --   --   --   --   --   --   --   INR  --   --  1.1  --   --   --   --   --   --   --   --   --   --   --   --   HEPARINUNFRC  --    < > 0.56  --   --   --  0.45   < > 0.27*   < > 0.30   < > 0.26* 0.29* 0.44  CREATININE 1.44*   < >  --   --   --   --  1.50*  --   --   --  1.44*  --  1.40*  --   --   TROPONINIHS 760*  --  679*  --   --   --   --   --   --   --   --   --   --   --   --    < > = values in this interval not displayed.    Estimated Creatinine Clearance: 80.3 mL/min (A) (by C-G formula based on SCr of 1.4 mg/dL (H)).   Medical History: Past Medical History:  Diagnosis Date   Chest wall pain, chronic    Chronic systolic CHF (congestive heart failure) ([facility])    Chronic Troponin Elevation    CKD (chronic kidney disease), stage II-III    COPD (chronic obstructive pulmonary disease) ([place])    Diabetes mellitus without complication ([place])    Hypertension    Resolved since weight loss   NICM (nonischemic cardiomyopathy) ([place])    a. 03/2015 Echo: EF 45-50%; b. 12/2015 Echo: EF 20-25%; c. 02/2016 Echo: EF 30-35%; d. 11/2016 Echo: EF 40-45%;  e. 06/2019 Echo: EF 30-35%; f. 11/2019 Echo: EF 25-30%, glob HK. Mild LVH. Gr2 DD. Mild-mod AI. BAE. Triv MR.   Non-obstructive CAD (coronary artery  disease)    a. 04/2015 low risk MV;  b. 12/2016 Cath: minor irregs in LAD/Diag/LCX/OM, RCA 40p/m/d.   NSVT (nonsustained ventricular tachycardia) ([place])    a. 12/2015 noted on tele-->amio;  b. 12/2015 Event monitor: no VT. Atach noted.   Obesity (BMI 30.0-34.9)    Psoriasis    Syncope    a. 01/2016 - felt to be vasovagal.   Tobacco abuse     Assessment: Pharmacy consulted for heparin infusion dosing and monitoring for 49 yo male admitted with ACS/STEMI. Patient reports ongoing chest pain over the past week. He has PMH of CHF, CKD, COPD, DM, HTN, and PE. He was recently discharged from the hospital on 10/22 after admission for PEs. Patient was started on Xarelto during his inpatient visit, but did not pick up his prescription from the pharmacy. Last dose of Xarelto 10/22.   Goal of Therapy:  Heparin level 0.3-0.7 units/ml aPTT 66-102 seconds Monitor platelets by anticoagulation protocol: Yes  10/24 - heparin 5000 units x 1, 1800 units/hr (based on previous visit) 10/24 @ 1625 APTT 80 - continue 1800 units/hr.  10/25 @ 0019 aPTT 68 Heparin level 0.45  10/25 @ 1048 aPTT 51 Heparin level 0.27 - increase heparin to 1900 units/hr. 10/25 @ 2009 HL 0.30 (drawn ~ 5 hours after rate change) 10/26 @ 0424 HL 0.26 subtherapeutic - Increase heparin to 2050 units/hr  10/26 @ 2122 HL 0.29, subtherapeutic -Increase heparin to 2200 units/hr 10/27 @ 0440 HL 0.44, therapeutic x 1 - continue heparin 2200 units/hr  Plan:  --Heparin level therapeutic. Continue heparin at 2200 units/hr --Re-check HL at 1100 to confirm --Daily CBC per protocol  Tawnya Crook, PharmD 06/14/2020  5:43 AM

## 2020-06-14 NOTE — Discharge Summary (Addendum)
Physician Discharge Summary  Gabriel [name]. UXN:[id number] DOB: 06-Jan-1971 DOA: 06/11/2020  PCP: Patient, No Pcp Per  Admit date: 06/11/2020 Discharge date: 06/14/2020  Discharge disposition: Home   Recommendations for Outpatient Follow-Up:   Follow-up with cardiologist in 1 week   Discharge Diagnosis:   Principal Problem:   NSTEMI (non-ST elevated myocardial infarction) ([place]) Active Problems:   Essential hypertension   Hyperlipidemia   Chronic systolic CHF (congestive heart failure) ([place])   Coronary artery disease, non-occlusive   CKD (chronic kidney disease), stage IIIa   Acute on chronic HFrEF (heart failure with reduced ejection fraction) (HCC)   Type II diabetes mellitus with renal manifestations (HCC)   COPD (chronic obstructive pulmonary disease) ([place])   Leukocytosis   PE (pulmonary thromboembolism) ([place])    Discharge Condition: Stable.  Diet recommendation:  Diet Order            Diet - low sodium heart healthy           Diet Carb Modified           Diet heart healthy/carb modified Room service appropriate? Yes; Fluid consistency: Thin; Fluid restriction: 2000 mL Fluid  Diet effective now                   Code Status: Full Code     Hospital Course:    [name], Ing. is a 49 year old male with medical history significant for CAD, chronic systolic CHF (EF of 35 to 40% in October 2021) hyperlipidemia, hypertension, type II DM, COPD, NSVT, CKD stage IIIa, recent right lower extremity DVT and PE (on Xarelto but not medically adherent).  He presented to the hospital because of chest pain or shortness of breath.  Troponins were elevated and peaked at 760.  Patient was admitted to the hospital for acute on chronic systolic CHF and possible NSTEMI (but NSTEMI was ruled out).  He was treated with IV heparin infusion and IV Lasix.  He was evaluated by the cardiologist and he underwent left and right heart cath.  This showed chronic total  occlusion of the proximal RCA with development of left-to-right collaterals and nonobstructive CAD involving the LAD and left circumflex with up to 50% stenosis.  NSTEMI was ruled out.  Elevated troponin was attributed to demand ischemia.  His condition has improved.  He has been able to ambulate in the hallway without any symptoms or decompensation.  He is deemed stable for discharge to home today.  From cardiology standpoint, patient is okay for discharge.   Medical Consultants:    Cardiologist   Discharge Exam:    Vitals:   06/14/20 0206 06/14/20 0428 06/14/20 0754 06/14/20 1138  BP:  131/80 133/78 (!) 137/92  Pulse:  70 70 72  Resp:  16 16 16   Temp:  98 F (36.7 C) 98 F (36.7 C) 98.9 F (37.2 C)  TempSrc:  Oral Oral Oral  SpO2:  97% 97% 96%  Weight: 111.2 kg     Height:         GEN: NAD SKIN: No rash EYES: EOMI ENT: MMM CV: RRR PULM: CTA B ABD: soft, obese, NT, +BS CNS: AAO x 3, non focal EXT: No edema or tenderness   The results of significant diagnostics from this hospitalization (including imaging, microbiology, ancillary and laboratory) are listed below for reference.     Procedures and Diagnostic Studies:   DG Chest 2 View  Result Date: 06/11/2020 CLINICAL DATA:  Chest pain EXAM: CHEST -  2 VIEW COMPARISON:  Chest x-rays dated 06/07/2020, 05/12/2020 and 12/31/2019. FINDINGS: Stable cardiomegaly. Chronic scarring/atelectasis within the LEFT mid lung region. Stable mild interstitial prominence bilaterally. Lungs are otherwise clear. No pleural effusion or pneumothorax is seen. No acute appearing osseous abnormality. Mild degenerative spurring in the mid/lower thoracic spine. IMPRESSION: 1. Stable interstitial prominence bilaterally, suggesting chronic mild CHF/volume overload. 2. No evidence of superimposed pneumonia. 3. Stable cardiomegaly. Electronically Signed   By: Franki Cabot M.D.   On: 06/11/2020 07:47     Labs:   Basic Metabolic Panel: Recent Labs   Lab 06/07/20 1831 06/08/20 0420 06/11/20 0707 06/11/20 0707 06/12/20 0427 06/12/20 0427 06/12/20 2009 06/12/20 2009 06/13/20 0424 06/14/20 0440  NA  --    < > 138  --  140  --  136  --  137 137  K  --    < > 4.7   < > 4.2   < > 3.8   < > 3.9 4.2  CL  --    < > 104  --  101  --  98  --  98 101  CO2  --    < > 23  --  29  --  27  --  27 28  GLUCOSE  --    < > 237*  --  187*  --  219*  --  214* 232*  BUN  --    < > 30*  --  31*  --  33*  --  29* 26*  CREATININE  --    < > 1.44*  --  1.50*  --  1.44*  --  1.40* 1.18  CALCIUM  --    < > 9.8  --  9.4  --  8.6*  --  9.0 9.0  MG 1.8  --   --   --  1.8  --  1.7  --   --  2.0   < > = values in this interval not displayed.   GFR Estimated Creatinine Clearance: 95.3 mL/min (by C-G formula based on SCr of 1.18 mg/dL). Liver Function Tests: Recent Labs  Lab 06/07/20 1831 06/14/20 0440  AST 25 21  ALT 32 24  ALKPHOS 46 51  BILITOT 2.2* 1.5*  PROT 6.3* 6.7  ALBUMIN 3.2* 3.4*   Recent Labs  Lab 06/07/20 1831  LIPASE 26   No results for input(s): AMMONIA in the last 168 hours. Coagulation profile Recent Labs  Lab 06/11/20 0919  INR 1.1    CBC: Recent Labs  Lab 06/09/20 0514 06/11/20 0707 06/12/20 0427 06/13/20 0424 06/14/20 0440  WBC 7.0 11.8* 10.5 8.8 9.5  HGB 14.4 16.6 16.1 17.0 16.6  HCT 43.7 51.5 49.0 51.7 50.4  MCV 85.9 86.8 86.1 85.5 85.3  PLT 175 219 206 179 213   Cardiac Enzymes: No results for input(s): CKTOTAL, CKMB, CKMBINDEX, TROPONINI in the last 168 hours. BNP: Invalid input(s): POCBNP CBG: Recent Labs  Lab 06/13/20 1106 06/13/20 1656 06/13/20 2049 06/14/20 0757 06/14/20 1140  GLUCAP 149* 204* 162* 243* 239*   D-Dimer No results for input(s): DDIMER in the last 72 hours. Hgb A1c No results for input(s): HGBA1C in the last 72 hours. Lipid Profile Recent Labs    06/11/20 1625  CHOL 126  HDL 25*  LDLCALC 75  TRIG 130  CHOLHDL 5.0   Thyroid function studies No results for input(s):  TSH, T4TOTAL, T3FREE, THYROIDAB in the last 72 hours.  Invalid input(s): FREET3 Anemia work up No  results for input(s): VITAMINB12, FOLATE, FERRITIN, TIBC, IRON, RETICCTPCT in the last 72 hours. Microbiology Recent Results (from the past 240 hour(s))  Respiratory Panel by RT PCR (Flu A&B, Covid) - Nasopharyngeal Swab     Status: None   Collection Time: 06/07/20  6:31 PM   Specimen: Nasopharyngeal Swab  Result Value Ref Range Status   SARS Coronavirus 2 by RT PCR NEGATIVE NEGATIVE Final    Comment: (NOTE) SARS-CoV-2 target nucleic acids are NOT DETECTED.  The SARS-CoV-2 RNA is generally detectable in upper respiratoy specimens during the acute phase of infection. The lowest concentration of SARS-CoV-2 viral copies this assay can detect is 131 copies/mL. A negative result does not preclude SARS-Cov-2 infection and should not be used as the sole basis for treatment or other patient management decisions. A negative result may occur with  improper specimen collection/handling, submission of specimen other than nasopharyngeal swab, presence of viral mutation(s) within the areas targeted by this assay, and inadequate number of viral copies (<131 copies/mL). A negative result must be combined with clinical observations, patient history, and epidemiological information. The expected result is Negative.  Fact Sheet for Patients:  PinkCheek.be  Fact Sheet for Healthcare Providers:  GravelBags.it  This test is no t yet approved or cleared by the [place] FDA and  has been authorized for detection and/or diagnosis of SARS-CoV-2 by FDA under an Emergency Use Authorization (EUA). This EUA will remain  in effect (meaning this test can be used) for the duration of the COVID-19 declaration under Section 564(b)(1) of the Act, 21 U.S.C. section 360bbb-3(b)(1), unless the authorization is terminated or revoked sooner.     Influenza A  by PCR NEGATIVE NEGATIVE Final   Influenza B by PCR NEGATIVE NEGATIVE Final    Comment: (NOTE) The Xpert Xpress SARS-CoV-2/FLU/RSV assay is intended as an aid in  the diagnosis of influenza from Nasopharyngeal swab specimens and  should not be used as a sole basis for treatment. Nasal washings and  aspirates are unacceptable for Xpert Xpress SARS-CoV-2/FLU/RSV  testing.  Fact Sheet for Patients: PinkCheek.be  Fact Sheet for Healthcare Providers: GravelBags.it  This test is not yet approved or cleared by the [place] FDA and  has been authorized for detection and/or diagnosis of SARS-CoV-2 by  FDA under an Emergency Use Authorization (EUA). This EUA will remain  in effect (meaning this test can be used) for the duration of the  Covid-19 declaration under Section 564(b)(1) of the Act, 21  U.S.C. section 360bbb-3(b)(1), unless the authorization is  terminated or revoked. Performed at [facility], [address] [place], [place] [address]   Respiratory Panel by RT PCR (Flu A&B, Covid) - Nasopharyngeal Swab     Status: None   Collection Time: 06/11/20  9:19 AM   Specimen: Nasopharyngeal Swab  Result Value Ref Range Status   SARS Coronavirus 2 by RT PCR NEGATIVE NEGATIVE Final    Comment: (NOTE) SARS-CoV-2 target nucleic acids are NOT DETECTED.  The SARS-CoV-2 RNA is generally detectable in upper respiratoy specimens during the acute phase of infection. The lowest concentration of SARS-CoV-2 viral copies this assay can detect is 131 copies/mL. A negative result does not preclude SARS-Cov-2 infection and should not be used as the sole basis for treatment or other patient management decisions. A negative result may occur with  improper specimen collection/handling, submission of specimen other than nasopharyngeal swab, presence of viral mutation(s) within the areas targeted by this assay, and inadequate number  of viral copies (<  131 copies/mL). A negative result must be combined with clinical observations, patient history, and epidemiological information. The expected result is Negative.  Fact Sheet for Patients:  PinkCheek.be  Fact Sheet for Healthcare Providers:  GravelBags.it  This test is no t yet approved or cleared by the [place] FDA and  has been authorized for detection and/or diagnosis of SARS-CoV-2 by FDA under an Emergency Use Authorization (EUA). This EUA will remain  in effect (meaning this test can be used) for the duration of the COVID-19 declaration under Section 564(b)(1) of the Act, 21 U.S.C. section 360bbb-3(b)(1), unless the authorization is terminated or revoked sooner.     Influenza A by PCR NEGATIVE NEGATIVE Final   Influenza B by PCR NEGATIVE NEGATIVE Final    Comment: (NOTE) The Xpert Xpress SARS-CoV-2/FLU/RSV assay is intended as an aid in  the diagnosis of influenza from Nasopharyngeal swab specimens and  should not be used as a sole basis for treatment. Nasal washings and  aspirates are unacceptable for Xpert Xpress SARS-CoV-2/FLU/RSV  testing.  Fact Sheet for Patients: PinkCheek.be  Fact Sheet for Healthcare Providers: GravelBags.it  This test is not yet approved or cleared by the [place] FDA and  has been authorized for detection and/or diagnosis of SARS-CoV-2 by  FDA under an Emergency Use Authorization (EUA). This EUA will remain  in effect (meaning this test can be used) for the duration of the  Covid-19 declaration under Section 564(b)(1) of the Act, 21  U.S.C. section 360bbb-3(b)(1), unless the authorization is  terminated or revoked. Performed at [facility], [address] [place], [place] [address]      Discharge Instructions:   Discharge Instructions    Diet - low sodium heart healthy   Complete by:  As directed    Diet Carb Modified   Complete by: As directed    Discharge instructions   Complete by: As directed    Cardiologist will arrange a follow-up visit for you   Increase activity slowly   Complete by: As directed      Allergies as of 06/14/2020      Reactions   Metformin And Related Nausea And Vomiting   Prednisone Other (See Comments)   Reaction: Hallucinations    Zantac [ranitidine Hcl] Diarrhea, Nausea Only   Night sweats      Medication List    STOP taking these medications   cloNIDine 0.1 MG tablet Commonly known as: CATAPRES   sacubitril-valsartan 49-51 MG Commonly known as: ENTRESTO Replaced by: [name] 97-103 MG     TAKE these medications   aspirin 81 MG EC tablet Take 1 tablet (81 mg total) by mouth daily. Swallow whole. Start taking on: June 15, 2020   B-D UF III MINI PEN NEEDLES 31G X 5 MM Misc Generic drug: Insulin Pen Needle 1 DOSE BY DOES NOT APPLY ROUTE 4 (FOUR) TIMES DAILY - WITH MEALS AND AT BEDTIME. What changed:   medication strength  See the new instructions.   Basaglar KwikPen 100 UNIT/ML Inject 28 Units into the skin at bedtime.   blood glucose meter kit and supplies Kit Dispense based on patient and insurance preference. Use up to four times daily as directed. (FOR ICD-9 250.00, 250.01).   carvedilol 6.25 MG tablet Commonly known as: COREG Take 1 tablet (6.25 mg total) by mouth 2 (two) times daily with a meal. What changed:   medication strength  how much to take   Entresto 97-103 MG Generic drug: sacubitril-valsartan Take 1 tablet by  mouth 2 (two) times daily. Replaces: sacubitril-valsartan 49-51 MG   fenofibrate 145 MG tablet Commonly known as: Tricor Take 1 tablet (145 mg total) by mouth daily.   insulin lispro 100 UNIT/ML KwikPen Commonly known as: HUMALOG Inject 3 Units into the skin 3 (three) times daily before meals.   Ipratropium-Albuterol 20-100 MCG/ACT Aers respimat Commonly known as:  COMBIVENT Inhale 1 puff into the lungs every 6 (six) hours.   nitroGLYCERIN 0.4 MG SL tablet Commonly known as: NITROSTAT Place 1 tablet (0.4 mg total) under the tongue every 5 (five) minutes x 3 doses as needed for chest pain.   Rivaroxaban Stater Pack (15 mg and 20 mg) Commonly known as: XARELTO STARTER PACK Follow package directions: Take one 53m tablet by mouth twice a day. On day 22, switch to one 253mtablet once a day. Take with food.   rosuvastatin 20 MG tablet Commonly known as: CRESTOR Take 1 tablet (20 mg total) by mouth at bedtime.   spironolactone 25 MG tablet Commonly known as: ALDACTONE Take 1 tablet (25 mg total) by mouth 2 (two) times daily.   torsemide 20 MG tablet Commonly known as: DEMADEX Take 3 tablets (60 mg total) by mouth 2 (two) times daily. What changed:   how much to take  when to take this       [place] up on 06/16/2020.   Specialty: Cardiology Why: at 11:00am. Enter through the [facility] Contact information: [id number]             Time coordinating discharge: 33 minutes  Signed:  BEJennye BoroughsTriad Hospitalists 06/14/2020, 2:10 PM   Pager on [email or web address] 7PM-7AM, please contact night-coverage at [email or web address]

## 2020-06-14 NOTE — Progress Notes (Addendum)
Progress Note  Patient Name: Gabriel [name]. Date of Encounter: 06/14/2020  Primary Cardiologist: Rockey Situ  Subjective   He underwent R/LHC on 10/26 that showed CTO of the proximal RCA, which was new when compared to cardiac cath in 2018 with the distal vessel filling via left-to-right collaterals. There was otherwise mild nonobstructive disease involving the LAD and LCx. Mildly elevated left and right heart filling pressures. Moderate pulmonary hypertension and severely reduced cardiac output.   This morning, he continues to note improvement in his breathing. No chest pain, palpitations, dizziness, presyncope, or syncope. Post cath labs show continued improvement in renal function. Vitals stable. Documented UOP of 2.1 L over the past 24 hours with minimal input noted. Net negative 8.4 L for the admission. Weight 114.3-->108.7-->111.2 kg. He remains on heparin gtt.    Inpatient Medications    Scheduled Meds: . aspirin EC  81 mg Oral Daily  . benzonatate  100 mg Oral TID  . carvedilol  6.25 mg Oral BID WC  . dextromethorphan-guaiFENesin  1 tablet Oral BID  . furosemide  40 mg Intravenous Q12H  . insulin aspart  0-5 Units Subcutaneous QHS  . insulin aspart  0-9 Units Subcutaneous TID WC  . insulin glargine  19 Units Subcutaneous Daily  . ipratropium-albuterol  3 mL Inhalation Q6H  . magnesium oxide  400 mg Oral Daily  . methocarbamol  500 mg Oral TID  . rosuvastatin  20 mg Oral QHS  . sacubitril-valsartan  1 tablet Oral BID  . sodium chloride flush  3 mL Intravenous Q12H  . spironolactone  25 mg Oral BID   Continuous Infusions: . sodium chloride    . heparin 2,200 Units/hr (06/14/20 0629)   PRN Meds: sodium chloride, acetaminophen, albuterol, menthol-cetylpyridinium, morphine injection, nitroGLYCERIN, ondansetron (ZOFRAN) IV, sodium chloride flush   Vital Signs    Vitals:   06/13/20 1937 06/13/20 2000 06/14/20 0206 06/14/20 0428  BP: 139/90   131/80  Pulse: 74   70    Resp: 16 19  16   Temp: 98.2 F (36.8 C)   98 F (36.7 C)  TempSrc: Oral   Oral  SpO2: 98%   97%  Weight:   111.2 kg   Height:        Intake/Output Summary (Last 24 hours) at 06/14/2020 0752 Last data filed at 06/14/2020 0428 Gross per 24 hour  Intake 250.1 ml  Output 2425 ml  Net -2174.9 ml   Filed Weights   06/13/20 0300 06/13/20 0929 06/14/20 0206  Weight: 108.7 kg 108.9 kg 111.2 kg    Telemetry    SR with occasional PVCs and ventricular couplet/triplet - Personally Reviewed  ECG    No new tracings - Personally Reviewed  Physical Exam   GEN: No acute distress.   Neck: No JVD. Cardiac: RRR, no murmurs, rubs, or gallops. Right radical cardiac cath site is without bleeding, swelling, warmth, erythema, or TTP. Radial pulse 2+.  Respiratory: CTA bilaterally.  GI: Soft, nontender, non-distended.   MS: No edema; No deformity. Neuro:  Alert and oriented x 3; Nonfocal.  Psych: Normal affect.  Labs    Chemistry Recent Labs  Lab 06/07/20 1831 06/08/20 0420 06/12/20 2009 06/13/20 0424 06/14/20 0440  NA  --    < > 136 137 137  K  --    < > 3.8 3.9 4.2  CL  --    < > 98 98 101  CO2  --    < > 27 27  28  GLUCOSE  --    < > 219* 214* 232*  BUN  --    < > 33* 29* 26*  CREATININE  --    < > 1.44* 1.40* 1.18  CALCIUM  --    < > 8.6* 9.0 9.0  PROT 6.3*  --   --   --  6.7  ALBUMIN 3.2*  --   --   --  3.4*  AST 25  --   --   --  21  ALT 32  --   --   --  24  ALKPHOS 46  --   --   --  51  BILITOT 2.2*  --   --   --  1.5*  GFRNONAA  --    < > 59* >60 >60  ANIONGAP  --    < > 11 12 8    < > = values in this interval not displayed.     Hematology Recent Labs  Lab 06/12/20 0427 06/13/20 0424 06/14/20 0440  WBC 10.5 8.8 9.5  RBC 5.69 6.05* 5.91*  HGB 16.1 17.0 16.6  HCT 49.0 51.7 50.4  MCV 86.1 85.5 85.3  MCH 28.3 28.1 28.1  MCHC 32.9 32.9 32.9  RDW 14.0 13.7 13.8  PLT 206 179 213    Cardiac EnzymesNo results for input(s): TROPONINI in the last 168 hours.  No results for input(s): TROPIPOC in the last 168 hours.   BNP Recent Labs  Lab 06/07/20 1144 06/11/20 0707 06/12/20 1723  BNP 1,566.8* 1,553.1* 917.0*     DDimer No results for input(s): DDIMER in the last 168 hours.   Radiology    DG Chest 2 View  Result Date: 06/11/2020 IMPRESSION: 1. Stable interstitial prominence bilaterally, suggesting chronic mild CHF/volume overload. 2. No evidence of superimposed pneumonia. 3. Stable cardiomegaly. Electronically Signed   By: Franki Cabot M.D.   On: 06/11/2020 07:47    Cardiac Studies   2D echo 06/08/2020: 1. Left ventricular ejection fraction, by estimation, is 35 to 40%. The  left ventricle has moderately decreased function. The left ventricle  demonstrates global hypokinesis. The left ventricular internal cavity size  was moderately dilated. Left  ventricular diastolic parameters are indeterminate.  2. Right ventricular systolic function is normal. The right ventricular  size is normal. There is mildly elevated pulmonary artery systolic  pressure. The estimated right ventricular systolic pressure is 40.3 mmHg.  3. Left atrial size was moderately dilated.  4. Mild to moderate mitral valve regurgitation. __________  2D echo 11/2019: 1. Left ventricular ejection fraction, by estimation, is 25 to 30%. The  left ventricle has severely decreased function. The left ventricle  demonstrates global hypokinesis. The left ventricular internal cavity size  was mildly dilated. There is mild left  ventricular hypertrophy. Left ventricular diastolic parameters are  consistent with Grade III diastolic dysfunction (restrictive). Elevated  left atrial pressure.  2. Right ventricular systolic function is moderately reduced. The right  ventricular size is mildly enlarged. There is moderately elevated  pulmonary artery systolic pressure.  3. Left atrial size was mild to moderately dilated.  4. Right atrial size was mildly dilated.  5. The  mitral valve is normal in structure. Trivial mitral valve  regurgitation. No evidence of mitral stenosis.  6. The aortic valve is tricuspid. Aortic valve regurgitation is mild to  moderate. Mild aortic valve sclerosis is present, with no evidence of  aortic valve stenosis.  7. Aortic dilatation noted. There is borderline dilatation of the  aortic  root measuring 38 mm.  8. The inferior vena cava is dilated in size with <50% respiratory  variability, suggesting right atrial pressure of 15 mmHg.  __________  LHC 12/2016:  There is moderate to severe left ventricular systolic dysfunction.  LV end diastolic pressure is moderately elevated.  The left ventricular ejection fraction is 25-35% by visual estimate.  Dist RCA lesion, 40 %stenosed.  Prox RCA to Mid RCA lesion, 40 %stenosed.   1. Mild nonobstructive coronary artery disease. 2. Moderately to severely reduced LV systolic function with an EF of 30-35%. 3. Moderately elevated left ventricular end-diastolic pressure. LVEDP was in the high 20s.  Recommendations: Continue medical therapy for nonobstructive coronary artery disease. The patient has nonischemic cardiomyopathy. He is volume overloaded and I increased torsemide to 40 mg by mouth twice daily. Consider adding spironolactone or eplerenone in the near future   Patient Profile     49 y.o. 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  Assessment & Plan    1. Elevated HS-Tn: -Peaked at 760, now down trending -Felt to be secondary to supply demand ischemia -EKG not consistent with ACS -Prior LHC showed nonobstructive disease as outlined above -LHC 10/26 with CTO of the proximal RCA with left-to-right collaterals to the distal RCA, which was new when compared to cath in  2018, with mild nonobstructive disease involving the LAD and LCx with medical management advised  -Post cath instructions   -Echo earlier this month with improved LVSF as above -Can transition from IV heparin back to PTA Xarelto, for PE, today per IM  -When [facility] is resumed can stop ASA -Coreg -Crestor   2. Acute on chronic HFrEF secondary to mixed ICM and dilated NICM/pulmonary hypertension: -Patient recently admitted from 10/20-10/22 with return to the hospital on 10/24 with continued dyspnea -Echo earlier this month with an improved LVSF with an EF of 35-40% -R/LHC as above -With severely reduced cardiac output, Coreg was decreased 10/26 -Titrate Entresto  -Continue spironolactone  -Transition IV Lasix to torsemide 60 mg bid today -Continue to follow up with the advanced heart failure clinic  -Medication adherence appears to be a significant issue  -CHF education -Daily weights -Strict I/O  3. NSVT: -Improved -Asymptomatic  -Coreg  -Potassium and magnesium at goal -Last echo with improved cardiomyopathy as above  4. DVT/PE: -Currently on heparin  -Resume Xarelto prior to discharge per IM   5. Acute on CKD stage III: -Improving  -Monitor  6. HTN: -Blood pressure much improved and currently well controlled -Continue medications as above  7. HLD: -LDL 75 this admission -Crestor   Dispo: -He can be discharged from a cardiac perspective with follow up in our office in 2 weeks. We will arrange this. Advanced heart failure team is arranging follow up as well.   For questions or updates, please contact [place] Please consult [email or web address] for contact info under Cardiology/STEMI.    Signed, Christell Faith, PA-C [place] Pager: [phone] 06/14/2020, 7:52 AM

## 2020-06-14 NOTE — Progress Notes (Signed)
IVs and tele removed from patient. Discharge instructions given to patient. Verbalized understanding. No acute distress at this time. Patient wheeled to medical mall and family to transport patient home.

## 2020-06-14 NOTE — TOC Initial Note (Signed)
Transition of Care (TOC) - Initial/Assessment Note    Patient Details  Name: Gabriel [name]. MRN: [id number] Date of Birth: 12/30/1970  Transition of Care [facility]) CM/SW Contact:    Eileen Stanford, LCSW Phone Number: 06/14/2020, 9:35 AM  Clinical Narrative:  High risk readmission screening completed. Pt states he lives with his father. Pt [facility] [address]. Pt does not have a PCP and when addressed about it pt states "I wan't to call around and get a appointment myself." Pt states he wants to get in at the clinic his father goes to. When asked if CSW could make the appointment for him he declines and says he will make it when he gets out of here. Pt states he drives himself around. Plan is for pt to d/c home. NO additional needs noted at this time.                  Expected Discharge Plan: Home/Self Care Barriers to Discharge: Continued Medical Work up   Patient Goals and CMS Choice Patient states their goals for this hospitalization and ongoing recovery are:: to get out of here tomorrow   Choice offered to / list presented to : NA  Expected Discharge Plan and Services Expected Discharge Plan: Home/Self Care     Post Acute Care Choice: NA Living arrangements for the past 2 months: Single Family Home                   DME Agency: NA       HH Arranged: NA          Prior Living Arrangements/Services Living arrangements for the past 2 months: Single Family Home Lives with:: Parents Patient language and need for interpreter reviewed:: Yes Do you feel safe going back to the place where you live?: Yes      Need for Family Participation in Patient Care: Yes (Comment) Care giver support system in place?: Yes (comment)   Criminal Activity/Legal Involvement Pertinent to Current Situation/Hospitalization: No - Comment as needed  Activities of Daily Living Home Assistive Devices/Equipment: Other (Comment) ADL Screening (condition at time of  admission) Patient's cognitive ability adequate to safely complete daily activities?: Yes Is the patient deaf or have difficulty hearing?: Yes (per pt - bilateral hearing loss) Does the patient have difficulty seeing, even when wearing glasses/contacts?: Yes (Readers at bedside) Does the patient have difficulty concentrating, remembering, or making decisions?: No Patient able to express need for assistance with ADLs?: Yes Does the patient have difficulty dressing or bathing?: Yes Independently performs ADLs?: Yes (appropriate for developmental age) Does the patient have difficulty walking or climbing stairs?: Yes (frequent episodes of shortness of breath) Weakness of Legs: None Weakness of Arms/Hands: None  Permission Sought/Granted Permission sought to share information with : Family Supports    Share Information with NAME: Mariea Clonts     Permission granted to share info w Relationship: father     Emotional Assessment Appearance:: Appears stated age Attitude/Demeanor/Rapport: Engaged Affect (typically observed): Accepting, Appropriate Orientation: : Oriented to Situation, Oriented to  Time, Oriented to Place, Oriented to Self Alcohol / Substance Use: Not Applicable Psych Involvement: No (comment)  Admission diagnosis:  NSTEMI (non-ST elevated myocardial infarction) [facility]) [I21.4] Patient Active Problem List   Diagnosis Date Noted  . NSTEMI (non-ST elevated myocardial infarction) ([place]) 06/11/2020  . Type II diabetes mellitus with renal manifestations ([place]) 06/11/2020  . COPD (chronic obstructive pulmonary disease) ([place]) 06/11/2020  .  Leukocytosis 06/11/2020  . PE (pulmonary thromboembolism) ([place]) 06/11/2020  . Hyperglycemia   . Elevated brain natriuretic peptide (BNP) level   . Uncontrolled type 2 diabetes mellitus with hyperglycemia, with long-term current use of insulin ([place]) 05/13/2020  . Acute on chronic HFrEF (heart failure with reduced ejection fraction) ([place]) 05/12/2020  .  Erectile dysfunction due to arterial insufficiency 03/15/2020  . Screening PSA (prostate specific antigen) 03/15/2020  . Erectile dysfunction 03/02/2020  . Acute pulmonary embolism ([place]) 01/03/2020  . Pulmonary embolism without acute cor pulmonale ([place])   . 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 Hemoptysis   . Costochondritis   . Acute respiratory failure with hypoxia ([place])   . Acute on chronic combined systolic (congestive) and diastolic (congestive) heart failure ([place]) 12/20/2019  . Acute on chronic heart failure ([place]) 11/22/2019  . CKD (chronic kidney disease), stage IIIa 06/26/2019  . Type 2 diabetes mellitus with hyperlipidemia ([place]) 06/26/2019  . Hypertensive urgency 06/26/2019  . Acute on chronic combined systolic and diastolic CHF (congestive heart failure) ([place]) 09/02/2017  . Other chest pain 08/24/2017  . Dizziness   . Noncompliance with medications 04/29/2017  . Back pain 03/06/2017  . Tobacco use 12/04/2016  . Gallbladder sludge 12/04/2016  . Acute on chronic systolic CHF (congestive heart failure) ([place]) 11/28/2016  . Coronary artery disease, non-occlusive 03/15/2016  . Atypical chest pain 02/16/2016  . Orthostatic hypotension 01/23/2016  . Hypomagnesemia 01/23/2016  . Chronic systolic CHF (congestive heart failure) ([place]) 01/23/2016  . NICM (nonischemic cardiomyopathy) ([place]) 01/17/2016  . NSVT (nonsustained ventricular tachycardia) ([place])   . Troponin I above reference range   . Hyperlipidemia 05/17/2015  . COPD exacerbation ([place]) 05/17/2015  . Essential hypertension 03/31/2015   PCP:  Patient, No Pcp Per Pharmacy:   [id number] [place] [place] - [address] [place] [address] Phone: [phone] Fax: [phone]     Social Determinants of Health (SDOH) Interventions    Readmission Risk Interventions Readmission Risk Prevention Plan 06/14/2020 05/14/2020 12/21/2019  Transportation Screening Complete Complete Complete  PCP or  Specialist Appt within 3-5 Days - Complete Complete  HRI or Home Care Consult - Complete Complete  Social Work Consult for [place] Planning/Counseling - - Complete  Palliative Care Screening - Not Applicable Not Applicable  Medication Review Press photographer) Complete Complete Complete  PCP or Specialist appointment within 3-5 days of discharge (No Data) - -  [place] or Home Care Consult Complete - -  SW Recovery Care/Counseling Consult Complete - -  Palliative Care Screening Not Applicable - -  [place] Not Applicable - -  Some recent data might be hidden

## 2020-06-14 NOTE — Progress Notes (Signed)
   Heart Failure Nurse Navigator Note  HFrEF 35-40%, mildly elevated pulmonary arterial pressures, mod left atrial enlargement, mild to moderate MR.  He presented to the ED with complaints of chest discomfort, "like bricks sitting on chest".  He had been discharged 2 days prior.   Co morbidities:  Coronary artery disease CKD COPD Diabetes Hypertension Hyperlipidemia   Medications:  ASA 81 mg daily Coreg 6.25 mg  BID (decreased from 25 mg) Lasix 40 mg IV BID Crestor 20 mg daily Entresto 97/103 BID (increased from 49/51) Spironolactone 25 mg daily  Cardiology plans to add SGLT2i  as outpatient.  Labs:  Sodium 137, potassium 4.2,BUN 26 (29), creatinine 1.18 (1.4), magnesium 2.0.  Intake 250 ml Output 2425 ml  Weight- 111.2 kg yesterday 108.9 kg, pt states he has been weighed in the bed.   Assessment:  General - he is awake and alert, lying in bed, denies any chest discomfort or SOB.  HEENT- pupils equal, normocephalic.  Cardiac- heart tones of regular rate and rhythm.  Chest- lungs are clear.  GI- stomach is rounded and soft.  Musculoskeletal- not edema lower extremities, pedal pulses 2+.  Neuro- speech clear, moves all extremities.  Psych- makes eye contact, pleasant and appropriate.   Discussed discharge with patient - states he feels he is ready to go home, talked about daily weights and recording, letting Otila Kluver in the HF clinic or his physician  know about weight gains or changes in symptoms.  He states he does not have any further questions at this time.  Also discussed [facility], he states it is not a problem to get up there to see them, but would like to keep appointments close to home.  Have asked staff to obtain [place] Vest reading and also to re weigh the patient.   Pricilla Riffle RN, CHFN

## 2020-06-14 NOTE — Plan of Care (Signed)
°  Problem: Education: °Goal: Knowledge of General Education information will improve °Description: Including pain rating scale, medication(s)/side effects and non-pharmacologic comfort measures °Outcome: Progressing °  °Problem: Health Behavior/Discharge Planning: °Goal: Ability to manage health-related needs will improve °Outcome: Progressing °  °Problem: Clinical Measurements: °Goal: Ability to maintain clinical measurements within normal limits will improve °Outcome: Progressing °Goal: Will remain free from infection °Outcome: Progressing °Goal: Diagnostic test results will improve °Outcome: Progressing °Goal: Respiratory complications will improve °Outcome: Progressing °Goal: Cardiovascular complication will be avoided °Outcome: Progressing °  °Problem: Activity: °Goal: Risk for activity intolerance will decrease °Outcome: Progressing °  °Problem: Nutrition: °Goal: Adequate nutrition will be maintained °Outcome: Progressing °  °Problem: Coping: °Goal: Level of anxiety will decrease °Outcome: Progressing °  °Problem: Elimination: °Goal: Will not experience complications related to bowel motility °Outcome: Progressing °Goal: Will not experience complications related to urinary retention °Outcome: Progressing °  °Problem: Pain Managment: °Goal: General experience of comfort will improve °Outcome: Progressing °  °Problem: Safety: °Goal: Ability to remain free from injury will improve °Outcome: Progressing °  °Problem: Skin Integrity: °Goal: Risk for impaired skin integrity will decrease °Outcome: Progressing °  °Problem: Education: °Goal: Understanding of cardiac disease, CV risk reduction, and recovery process will improve °Outcome: Progressing °Goal: Individualized Educational Video(s) °Outcome: Progressing °  °Problem: Activity: °Goal: Ability to tolerate increased activity will improve °Outcome: Progressing °  °Problem: Cardiac: °Goal: Ability to achieve and maintain adequate cardiovascular perfusion will  improve °Outcome: Progressing °  °Problem: Health Behavior/Discharge Planning: °Goal: Ability to safely manage health-related needs after discharge will improve °Outcome: Progressing °  °

## 2020-06-14 NOTE — Progress Notes (Signed)
Inpatient Diabetes Program Recommendations  AACE/ADA: New Consensus Statement on Inpatient Glycemic Control (2015)  Target Ranges:  Prepandial:   less than 140 mg/dL      Peak postprandial:   less than 180 mg/dL (1-2 hours)      Critically ill patients:  140 - 180 mg/dL   Lab Results  Component Value Date   GLUCAP 243 (H) 06/14/2020   HGBA1C 9.3 (H) 06/07/2020    Review of Glycemic Control Results for Gabriel [name], Gabriel [name] (MRN [id number]) as of 06/14/2020 11:22  Ref. Range 06/13/2020 11:06 06/13/2020 16:56 06/13/2020 20:49 06/14/2020 07:57  Glucose-Capillary Latest Ref Range: 70 - 99 mg/dL 149 (H) 204 (H) 162 (H) 243 (H)   Diabetes history: DM 2 Outpatient Diabetes medications:  Basaglar 28 units q HS Humalog 3 units tid with meals  Current orders for Inpatient glycemic control:  Novolog sensitive tid with meals and HS Lantus 19 units daily  Inpatient Diabetes Program Recommendations:    Please increase Lantus back to home dose of 28 units q HS.    Thanks  Adah Perl, RN, BC-ADM Inpatient Diabetes Coordinator Pager [phone] (8a-5p)

## 2020-06-16 ENCOUNTER — Ambulatory Visit: Payer: Medicaid Other | Attending: Family | Admitting: Family

## 2020-06-16 ENCOUNTER — Other Ambulatory Visit: Payer: Self-pay

## 2020-06-16 ENCOUNTER — Encounter: Payer: Self-pay | Admitting: Family

## 2020-06-16 VITALS — BP 92/60 | HR 75 | Resp 20 | Ht 72.0 in | Wt 239.2 lb

## 2020-06-16 DIAGNOSIS — I130 Hypertensive heart and chronic kidney disease with heart failure and stage 1 through stage 4 chronic kidney disease, or unspecified chronic kidney disease: Secondary | ICD-10-CM | POA: Insufficient documentation

## 2020-06-16 DIAGNOSIS — Z87891 Personal history of nicotine dependence: Secondary | ICD-10-CM | POA: Diagnosis not present

## 2020-06-16 DIAGNOSIS — J449 Chronic obstructive pulmonary disease, unspecified: Secondary | ICD-10-CM | POA: Insufficient documentation

## 2020-06-16 DIAGNOSIS — E119 Type 2 diabetes mellitus without complications: Secondary | ICD-10-CM

## 2020-06-16 DIAGNOSIS — I2510 Atherosclerotic heart disease of native coronary artery without angina pectoris: Secondary | ICD-10-CM | POA: Diagnosis not present

## 2020-06-16 DIAGNOSIS — E1122 Type 2 diabetes mellitus with diabetic chronic kidney disease: Secondary | ICD-10-CM | POA: Insufficient documentation

## 2020-06-16 DIAGNOSIS — J45909 Unspecified asthma, uncomplicated: Secondary | ICD-10-CM | POA: Diagnosis not present

## 2020-06-16 DIAGNOSIS — I272 Pulmonary hypertension, unspecified: Secondary | ICD-10-CM | POA: Insufficient documentation

## 2020-06-16 DIAGNOSIS — Z79899 Other long term (current) drug therapy: Secondary | ICD-10-CM | POA: Insufficient documentation

## 2020-06-16 DIAGNOSIS — N189 Chronic kidney disease, unspecified: Secondary | ICD-10-CM | POA: Diagnosis not present

## 2020-06-16 DIAGNOSIS — Z86711 Personal history of pulmonary embolism: Secondary | ICD-10-CM | POA: Insufficient documentation

## 2020-06-16 DIAGNOSIS — Z7901 Long term (current) use of anticoagulants: Secondary | ICD-10-CM | POA: Insufficient documentation

## 2020-06-16 DIAGNOSIS — I5022 Chronic systolic (congestive) heart failure: Secondary | ICD-10-CM

## 2020-06-16 DIAGNOSIS — Z794 Long term (current) use of insulin: Secondary | ICD-10-CM | POA: Insufficient documentation

## 2020-06-16 DIAGNOSIS — Z8249 Family history of ischemic heart disease and other diseases of the circulatory system: Secondary | ICD-10-CM | POA: Insufficient documentation

## 2020-06-16 DIAGNOSIS — Z888 Allergy status to other drugs, medicaments and biological substances status: Secondary | ICD-10-CM | POA: Diagnosis not present

## 2020-06-16 DIAGNOSIS — Z7982 Long term (current) use of aspirin: Secondary | ICD-10-CM | POA: Diagnosis not present

## 2020-06-16 DIAGNOSIS — I10 Essential (primary) hypertension: Secondary | ICD-10-CM

## 2020-06-16 LAB — GLUCOSE, CAPILLARY: Glucose-Capillary: 329 mg/dL — ABNORMAL HIGH (ref 70–99)

## 2020-06-16 NOTE — Progress Notes (Signed)
Patient ID: Gabriel [name]., male    DOB: 1971/01/05, 49 y.o.   MRN: [id number]  HPI  Gabriel [name] is a 49 y/o male with a history of HTN, diabetes, COPD, CKD, asthma, previous tobacco use and chronic heart failure.   Echo report from 06/08/20 reviewed and showed an EF of 35-40% along with mildly elevated PA pressure of 44.7 mmHg, moderate LAE and mild/moderate Gabriel. Echo report from 11/23/19 reviewed and showed an EF of 25-30% with mild LVH and moderately elevated PA Pressure. Echo report from 06/26/2019 reviewed and showed an EF of 30-35% along with moderately elevated PA pressure. Echo done 04/29/17 showed EF 45-50%. Echo report that was done 11/28/16 and it showed an EF of 40-45% along with mild Gabriel. EF has improved from 30-35% July 2017.  RHC/LHC done 06/13/20 and showed: 1. Severe single-vessel coronary artery disease with chronic total occlusion of the proximal RCA.  The distal vessel fills via left to right collaterals. 2. Mild to moderate, nonobstructive CAD involving the LAD and LCx with up to 50% stenosis. 3. Mildly elevated left and right heart filling pressures. 4. Moderate pulmonary hypertension (mean PAP 40 mmHg) with significantly elevated pulmonary vascular resistance (PVR 7.4 WU). 5. Severely reduced Fick cardiac output/index.  Admitted 06/11/20 due to chest pain and SOB. Cardiology consult obtained. Initially given IV lasix with transition to oral diuretics. RHC/LHC done with above results. Elevated troponin thought to be due to demand ischemia. NSTEMI ruled out. Discharged after 3 days. Admitted 06/07/20 due to CP and SOB. Chest CTA showed small PE's. Initially placed on heparin infustion with transition to xarelto. Given IV lasix initially as well.  Discharged after 2 days. Admitted 05/12/20 due to chest pain and acute/ chronic HF. Initially given IV lasix with transition to oral diuretics. Chest CT done and showed decreased clot from previous PE. No aortic dissection. Mildly elevated  troponin due to HF exacerbation. Discharged after 2 days. Was in the ED 05/05/20 but left without being seen.   He presents today for a follow-up visit with a chief complaint of mild cough. He describes this as having been present for many months although is better from when he was here last. Has associated rhinorrhea and abdominal distention (although much better) along with this. He denies any difficulty sleeping, dizziness, pedal edema, palpitations, chest pain, shortness of breath, fatigue or weight gain.   Overall he's feeling much better since he was last here. Does have all of his medications although has 2 different doses of carvedilol (6.21m dose and 251mdose) and he thinks he may have been taking both doses.   [facility]. Wanted to get established with the PCP that his dad sees but they aren't currently taking new patients.   Past Medical History:  Diagnosis Date  . Chest wall pain, chronic   . Chronic systolic CHF (congestive heart failure) ([place]  . Chronic Troponin Elevation   . CKD (chronic kidney disease), stage II-III   . COPD (chronic obstructive pulmonary disease) ([place]  . Diabetes mellitus without complication ([facility]  . Hypertension    Resolved since weight loss  . NICM (nonischemic cardiomyopathy) ([place]   a. 03/2015 Echo: EF 45-50%; b. 12/2015 Echo: EF 20-25%; c. 02/2016 Echo: EF 30-35%; d. 11/2016 Echo: EF 40-45%; e. 06/2019 Echo: EF 30-35%; f. 11/2019 Echo: EF 25-30%, glob HK. Mild LVH. Gr2 DD. Mild-mod AI. BAE. Triv Gabriel.  .Marland Kitchen  Non-obstructive CAD (coronary artery disease)    a. 04/2015 low risk MV;  b. 12/2016 Cath: minor irregs in LAD/Diag/LCX/OM, RCA 40p/m/d.  [name] (nonsustained ventricular tachycardia) ([place])    a. 12/2015 noted on tele-->amio;  b. 12/2015 Event monitor: no VT. Atach noted.  . Obesity (BMI 30.0-34.9)   . Psoriasis   . Syncope    a. 01/2016 - felt to be vasovagal.  . Tobacco abuse    Past Surgical History:   Procedure Laterality Date  . AMPUTATION    . CARDIAC CATHETERIZATION    . FINGER AMPUTATION     Traumatic  . FINGER FRACTURE SURGERY Left   . LEFT HEART CATH AND CORONARY ANGIOGRAPHY N/A 01/06/2017   Procedure: Left Heart Cath and Coronary Angiography;  Surgeon: Wellington Hampshire, MD;  Location: [facility];  Service: Cardiovascular;  Laterality: N/A;  . RIGHT/LEFT HEART CATH AND CORONARY ANGIOGRAPHY N/A 06/13/2020   Procedure: RIGHT/LEFT HEART CATH AND CORONARY ANGIOGRAPHY;  Surgeon: Nelva Bush, MD;  Location: [facility];  Service: Cardiovascular;  Laterality: N/A;   Family History  Problem Relation Age of Onset  . Diabetes Mellitus II Mother   . Hypothyroidism Mother   . Hypertension Mother   . Hypertension Father   . Gout Father   . Cancer Paternal Aunt   . Cancer Maternal Grandfather   . Diabetes Maternal Grandfather    Social History   Tobacco Use  . Smoking status: Former Smoker    Packs/day: 0.50    Years: 33.00    Pack years: 16.50    Types: Cigarettes    Quit date: 05/08/2020    Years since quitting: 0.1  . Smokeless tobacco: Former Network engineer Use Topics  . Alcohol use: Yes    Alcohol/week: 0.0 standard drinks    Comment: occassionally   Allergies  Allergen Reactions  . Metformin And Related Nausea And Vomiting  . Prednisone Other (See Comments)    Reaction: Hallucinations    . Zantac [Ranitidine Hcl] Diarrhea and Nausea Only    Night sweats   Prior to Admission medications   Medication Sig Start Date End Date Taking? Authorizing Provider  aspirin EC 81 MG EC tablet Take 1 tablet (81 mg total) by mouth daily. Swallow whole. 06/15/20  Yes Jennye Boroughs, MD  B-D UF III MINI PEN NEEDLES 31G X 5 MM MISC 1 DOSE BY DOES NOT APPLY ROUTE 4 (FOUR) TIMES DAILY - WITH MEALS AND AT BEDTIME. 06/12/20  Yes Lorella Nimrod, MD  blood glucose meter kit and supplies KIT Dispense based on patient and insurance preference. Use up to four times  daily as directed. (FOR ICD-9 250.00, 250.01). 06/09/20  Yes Lorella Nimrod, MD  carvedilol (COREG) 6.25 MG tablet Take 1 tablet (6.25 mg total) by mouth 2 (two) times daily with a meal. 06/14/20 07/14/20 Yes Jennye Boroughs, MD  fenofibrate (TRICOR) 145 MG tablet Take 1 tablet (145 mg total) by mouth daily. 06/09/20  Yes Lorella Nimrod, MD  Insulin Glargine (BASAGLAR KWIKPEN) 100 UNIT/ML Inject 28 Units into the skin at bedtime. 06/09/20  Yes Lorella Nimrod, MD  insulin lispro (HUMALOG) 100 UNIT/ML KwikPen Inject 3 Units into the skin 3 (three) times daily before meals. 06/09/20  Yes Lorella Nimrod, MD  Ipratropium-Albuterol (COMBIVENT) 20-100 MCG/ACT AERS respimat Inhale 1 puff into the lungs every 6 (six) hours. 06/09/20  Yes Lorella Nimrod, MD  nitroGLYCERIN (NITROSTAT) 0.4 MG SL tablet Place 1 tablet (0.4 mg total) under the tongue every  5 (five) minutes x 3 doses as needed for chest pain. 06/09/20  Yes Lorella Nimrod, MD  RIVAROXABAN Alveda Reasons) VTE STARTER PACK (15 & 20 MG TABLETS) Follow package directions: Take one 41m tablet by mouth twice a day. On day 22, switch to one 253mtablet once a day. Take with food. 06/09/20  Yes AmLorella NimrodMD  rosuvastatin (CRESTOR) 20 MG tablet Take 1 tablet (20 mg total) by mouth at bedtime. 06/09/20  Yes AmLorella NimrodMD  sacubitril-valsartan (ENTRESTO) 49-51 MG Take 1 tablet by mouth 2 (two) times daily.   Yes [provider]  spironolactone (ALDACTONE) 25 MG tablet Take 1 tablet (25 mg total) by mouth 2 (two) times daily. 06/09/20 07/09/20 Yes AmLorella NimrodMD  torsemide (DEMADEX) 20 MG tablet Take 3 tablets (60 mg total) by mouth 2 (two) times daily. Patient taking differently: Take 60 mg by mouth daily.  06/14/20  Yes AyJennye BoroughsMD    Review of Systems  Constitutional: Negative for appetite change and fatigue.  HENT: Positive for postnasal drip. Negative for congestion and sore throat.   Eyes: Negative.   Respiratory: Positive for cough.  Negative for chest tightness, shortness of breath and wheezing.   Cardiovascular: Negative for chest pain and leg swelling.  Gastrointestinal: Positive for abdominal distention (better). Negative for abdominal pain.  Endocrine: Negative.   Genitourinary: Negative.   Skin: Negative.   Allergic/Immunologic: Negative.   Neurological: Negative for dizziness and light-headedness.  Hematological: Negative for adenopathy. Does not bruise/bleed easily.  Psychiatric/Behavioral: Negative for dysphoric mood, sleep disturbance (sleeping on 1 pillow) and suicidal ideas. The patient is not nervous/anxious.    Vitals:   06/16/20 1011 06/16/20 1032  BP: (!) 88/56 92/60  Pulse: 75   Resp: 20   SpO2: 96%   Weight: 239 lb 4 oz (108.5 kg)   Height: 6' (1.829 m)    Wt Readings from Last 3 Encounters:  06/16/20 239 lb 4 oz (108.5 kg)  06/14/20 245 lb 2.4 oz (111.2 kg)  06/09/20 257 lb 11.2 oz (116.9 kg)   Lab Results  Component Value Date   CREATININE 1.18 06/14/2020   CREATININE 1.40 (H) 06/13/2020   CREATININE 1.44 (H) 06/12/2020   Physical Exam Vitals and nursing note reviewed.  Constitutional:      Appearance: He is well-developed.  HENT:     Head: Normocephalic and atraumatic.  Neck:     Vascular: No JVD.  Cardiovascular:     Rate and Rhythm: Normal rate and regular rhythm.  Pulmonary:     Effort: Pulmonary effort is normal.     Breath sounds: No wheezing or rales.  Chest:     Chest wall: No tenderness.  Abdominal:     General: There is no distension.     Palpations: Abdomen is soft.     Tenderness: There is no abdominal tenderness.  Musculoskeletal:        General: No tenderness.     Cervical back: Normal range of motion and neck supple.     Right lower leg: No edema.     Left lower leg: No edema.  Skin:    General: Skin is warm and dry.  Neurological:     Mental Status: He is alert and oriented to person, place, and time.  Psychiatric:        Behavior: Behavior normal.         Thought Content: Thought content normal.    Assessment & Plan:  1: Chronic heart failure with  reduced ejection fraction- - NYHA class I - euvolemic today (markedly improved) - weighing daily and reminded to call for an overnight weight gain of >2 pounds or a weekly weight gain of >5 pounds; weight at home has been rising.  - weight down 22.6 pounds since last visit here 9 days ago (after 2 hospitalizations) - not adding salt and he was encouraged to closely follow a 2052m sodium diet - saw cardiology (Rockey Situ 12/27/19 - saw EP (Caryl Comes 09/02/19; discussion about ICD  - he thinks he's been taking both doses of carvedilol as he had both doses in his bag; advised to stop the 277mcarvedilol dose and take the 6.2524mose; big X put on the bottle - [facility] and now has a f/u appt scheduled on 07/06/20 - BNP from 06/12/20 was 917.0  2: HTN- - BP low although slightly better upon recheck with manual cuff; no dizziness, fatigue etc; adjusting carvedilol dose per above - saw PCP @ [facility] 04/13/20; has medicaid now and would like us [place] make a NP appointment somewhere; [facility] - BMP from 06/14/20 reviewed and showed sodium 137, potassium 4.2, creatinine 1.18 and GFR >60  3: Diabetes-   -  A1c 9.3% on 06/07/20 - non-fasting glucose in clinic today was 329; had some sips of a regular Dr. PepMalachi Bondsior to coming to the office along with eating breakfast - completed some classes at the [facility]   Medication bottles were reviewed.   Since [facility] will be [facility], explained that there really isn't a reason to also follow here. When they release him he can call back and we can get him an appointment at that time. Patient verbalized understanding.

## 2020-06-16 NOTE — Patient Instructions (Addendum)
Continue weighing daily and call for an overnight weight gain of > 2 pounds or a weekly weight gain of >5 pounds.   Call us once you are released from [facility]

## 2020-06-22 ENCOUNTER — Telehealth: Payer: Self-pay | Admitting: Family

## 2020-06-22 NOTE — Telephone Encounter (Signed)
Patient called to say that his blood pressure is rising. When he was here last on 06/16/20, his BP was low at 92/60 as he was taking both carvedilol 25mg  BID and 6.25mg  BID. We stopped the 25mg  dose and he has continued the 6.25mg  BID dose.   Advised him to increase his 6.25mg  dose by taking 2 tablets BID and let's see how his BP does. Could also increase his entresto.   He will call back tomorrow with update before the weekend.

## 2020-06-23 ENCOUNTER — Encounter: Payer: Self-pay | Admitting: Physician Assistant

## 2020-06-23 ENCOUNTER — Other Ambulatory Visit: Payer: Self-pay

## 2020-06-23 ENCOUNTER — Telehealth: Payer: Self-pay | Admitting: Family

## 2020-06-23 ENCOUNTER — Ambulatory Visit (INDEPENDENT_AMBULATORY_CARE_PROVIDER_SITE_OTHER): Payer: Medicaid Other | Admitting: Physician Assistant

## 2020-06-23 VITALS — BP 132/83 | HR 81 | Temp 97.8°F | Ht 72.0 in | Wt 239.6 lb

## 2020-06-23 DIAGNOSIS — I5023 Acute on chronic systolic (congestive) heart failure: Secondary | ICD-10-CM | POA: Diagnosis not present

## 2020-06-23 DIAGNOSIS — Z23 Encounter for immunization: Secondary | ICD-10-CM

## 2020-06-23 DIAGNOSIS — E1169 Type 2 diabetes mellitus with other specified complication: Secondary | ICD-10-CM

## 2020-06-23 DIAGNOSIS — I2699 Other pulmonary embolism without acute cor pulmonale: Secondary | ICD-10-CM | POA: Diagnosis not present

## 2020-06-23 DIAGNOSIS — J449 Chronic obstructive pulmonary disease, unspecified: Secondary | ICD-10-CM

## 2020-06-23 DIAGNOSIS — E785 Hyperlipidemia, unspecified: Secondary | ICD-10-CM | POA: Diagnosis not present

## 2020-06-23 LAB — POCT GLYCOSYLATED HEMOGLOBIN (HGB A1C)
Est. average glucose Bld gHb Est-mCnc: 235
Hemoglobin A1C: 9.8 % — AB (ref 4.0–5.6)

## 2020-06-23 MED ORDER — CARVEDILOL 6.25 MG PO TABS: 12.5000 mg | ORAL_TABLET | Freq: Two times a day (BID) | ORAL | 0 refills | Status: DC

## 2020-06-23 MED ORDER — TRULICITY 0.75 MG/0.5ML ~~LOC~~ SOAJ: 0.7500 mg | SUBCUTANEOUS | 2 refills | Status: DC

## 2020-06-23 NOTE — Patient Instructions (Signed)
Diabetes Mellitus and Exercise Exercising regularly is important for your overall health, especially when you have diabetes (diabetes mellitus). Exercising is not only about losing weight. It has many other health benefits, such as increasing muscle strength and bone density and reducing body fat and stress. This leads to improved fitness, flexibility, and endurance, all of which result in better overall health. Exercise has additional benefits for people with diabetes, including:  Reducing appetite.  Helping to lower and control blood glucose.  Lowering blood pressure.  Helping to control amounts of fatty substances (lipids) in the blood, such as cholesterol and triglycerides.  Helping the body to respond better to insulin (improving insulin sensitivity).  Reducing how much insulin the body needs.  Decreasing the risk for heart disease by: ? Lowering cholesterol and triglyceride levels. ? Increasing the levels of good cholesterol. ? Lowering blood glucose levels. What is my activity plan? Your health care provider or certified diabetes educator can help you make a plan for the type and frequency of exercise (activity plan) that works for you. Make sure that you:  Do at least 150 minutes of moderate-intensity or vigorous-intensity exercise each week. This could be brisk walking, biking, or water aerobics. ? Do stretching and strength exercises, such as yoga or weightlifting, at least 2 times a week. ? Spread out your activity over at least 3 days of the week.  Get some form of physical activity every day. ? Do not go more than 2 days in a row without some kind of physical activity. ? Avoid being inactive for more than 30 minutes at a time. Take frequent breaks to walk or stretch.  Choose a type of exercise or activity that you enjoy, and set realistic goals.  Start slowly, and gradually increase the intensity of your exercise over time. What do I need to know about managing my  diabetes?   Check your blood glucose before and after exercising. ? If your blood glucose is 240 mg/dL (13.3 mmol/L) or higher before you exercise, check your urine for ketones. If you have ketones in your urine, do not exercise until your blood glucose returns to normal. ? If your blood glucose is 100 mg/dL (5.6 mmol/L) or lower, eat a snack containing 15-20 grams of carbohydrate. Check your blood glucose 15 minutes after the snack to make sure that your level is above 100 mg/dL (5.6 mmol/L) before you start your exercise.  Know the symptoms of low blood glucose (hypoglycemia) and how to treat it. Your risk for hypoglycemia increases during and after exercise. Common symptoms of hypoglycemia can include: ? Hunger. ? Anxiety. ? Sweating and feeling clammy. ? Confusion. ? Dizziness or feeling light-headed. ? Increased heart rate or palpitations. ? Blurry vision. ? Tingling or numbness around the mouth, lips, or tongue. ? Tremors or shakes. ? Irritability.  Keep a rapid-acting carbohydrate snack available before, during, and after exercise to help prevent or treat hypoglycemia.  Avoid injecting insulin into areas of the body that are going to be exercised. For example, avoid injecting insulin into: ? The arms, when playing tennis. ? The legs, when jogging.  Keep records of your exercise habits. Doing this can help you and your health care provider adjust your diabetes management plan as needed. Write down: ? Food that you eat before and after you exercise. ? Blood glucose levels before and after you exercise. ? The type and amount of exercise you have done. ? When your insulin is expected to peak, if you use   insulin. Avoid exercising at times when your insulin is peaking.  When you start a new exercise or activity, work with your health care provider to make sure the activity is safe for you, and to adjust your insulin, medicines, or food intake as needed.  Drink plenty of water while  you exercise to prevent dehydration or heat stroke. Drink enough fluid to keep your urine clear or pale yellow. Summary  Exercising regularly is important for your overall health, especially when you have diabetes (diabetes mellitus).  Exercising has many health benefits, such as increasing muscle strength and bone density and reducing body fat and stress.  Your health care provider or certified diabetes educator can help you make a plan for the type and frequency of exercise (activity plan) that works for you.  When you start a new exercise or activity, work with your health care provider to make sure the activity is safe for you, and to adjust your insulin, medicines, or food intake as needed. This information is not intended to replace advice given to you by your health care provider. Make sure you discuss any questions you have with your health care provider. Document Revised: 02/27/2017 Document Reviewed: 01/15/2016 Elsevier Patient Education  2020 Elsevier Inc.  

## 2020-06-23 NOTE — Progress Notes (Signed)
New patient visit   Patient: Gabriel [name].   DOB: 03-May-1971   49 y.o. Male  MRN: [id number] Visit Date: 06/23/2020  Today's healthcare provider: Trinna Post, PA-C   Chief Complaint  Patient presents with  . Diabetes  . Hypertension  . New Patient (Initial Visit)  I,Keeon Zurn M Bion Todorov,acting as a scribe for Trinna Post, PA-C.,have documented all relevant documentation on the behalf of Trinna Post, PA-C,as directed by  Trinna Post, PA-C while in the presence of Trinna Post, PA-C.  Subjective    Gabriel Nicko Daher. is a 49 y.o. male who presents today as a new patient to establish care.  HPI   Works at [employer], running the counter, Previously followed by [facility], lives with dad in [place]   Hypertension:  Hypertension, follow-up  BP Readings from Last 3 Encounters:  06/28/20 129/88  06/23/20 132/83  06/16/20 92/60   Wt Readings from Last 3 Encounters:  06/28/20 238 lb 9.6 oz (108.2 kg)  06/23/20 239 lb 9.6 oz (108.7 kg)  06/16/20 239 lb 4 oz (108.5 kg)     He was last seen for hypertension 1 months ago.  BP at that visit was normal. Management since that visit includes continue carvedilol and entresto.  He reports excellent compliance with treatment. He is not having side effects.  He is following a Regular diet. He is not exercising. He does not smoke.  Use of agents associated with hypertension: none.   Outside blood pressures are elevated. Symptoms: No chest pain No chest pressure  No palpitations No syncope  No dyspnea No orthopnea  No paroxysmal nocturnal dyspnea No lower extremity edema   Pertinent labs: Lab Results  Component Value Date   CHOL 126 06/11/2020   HDL 25 (L) 06/11/2020   LDLCALC 75 06/11/2020   LDLDIRECT 65.3 01/18/2020   TRIG 130 06/11/2020   CHOLHDL 5.0 06/11/2020   Lab Results  Component Value Date   NA 137 06/14/2020   K 4.2 06/14/2020   CREATININE 1.18  06/14/2020   GFRNONAA >60 06/14/2020   GFRAA >60 05/14/2020   GLUCOSE 232 (H) 06/14/2020     The ASCVD Risk score (Goff DC Jr., et al., 2013) failed to calculate for the following reasons:   The patient has a prior MI or stroke diagnosis   ---------------------------------------------------------------------------------------------------   Diabetes: Patient reports taking Humalog 3 times daily and Basaglag 28 unit. Alcohol abuse from 12 to 23, drank as much as he could.   Diabetes Mellitus Type II, Follow-up  Lab Results  Component Value Date   HGBA1C 9.8 (A) 06/23/2020   HGBA1C 9.3 (H) 06/07/2020   HGBA1C 8.2 (H) 05/13/2020   Wt Readings from Last 3 Encounters:  06/28/20 238 lb 9.6 oz (108.2 kg)  06/23/20 239 lb 9.6 oz (108.7 kg)  06/16/20 239 lb 4 oz (108.5 kg)   Last seen for diabetes 4 months ago.  Management since then includes 28 units basaglar nightly and humalog 3 units TID prior to meals. He had to stop metformin due to GI intolerance. [facility] He has never tried anything besides insulin and metformin. He reports good compliance with treatment. He is not having side effects.  Symptoms: No fatigue No foot ulcerations  No appetite changes No nausea  No paresthesia of the feet  No polydipsia  No polyuria No visual disturbances   No vomiting  Episodes of hypoglycemia? No    Current insulin regiment: 28 units basaglar nightly and humalog 3 units TID Most Recent Eye Exam: not UTD Current exercise: none Current diet habits: not asked  Pertinent Labs: Lab Results  Component Value Date   CHOL 126 06/11/2020   HDL 25 (L) 06/11/2020   LDLCALC 75 06/11/2020   LDLDIRECT 65.3 01/18/2020   TRIG 130 06/11/2020   CHOLHDL 5.0 06/11/2020   Lab Results  Component Value Date   NA 137 06/14/2020   K 4.2 06/14/2020   CREATININE 1.18 06/14/2020   GFRNONAA >60 06/14/2020   GFRAA >60 05/14/2020   GLUCOSE 232 (H) 06/14/2020      ---------------------------------------------------------------------------------------------------   Pulmonary Embolism:  Diagnosed with pulmonary embolism durnig 12/2019 hospitalization. He was placed on eliquis. This became unaffordable to him. During most recent 06/14/2020 hospitalization he had another pulmonary embolism  CHF: Summary taken from his most recent heart failure clinic visit:   Echo report from 06/08/20 reviewed and showed an EF of 35-40% along with mildly elevated PA pressure of 44.7 mmHg, moderate LAE and mild/moderate MR. Echo report from 11/23/19 reviewed and showed an EF of 25-30% with mild LVH and moderately elevated PA Pressure. Echo report from 06/26/2019 reviewed and showed an EF of 30-35% along with moderately elevated PA pressure. Echo done 04/29/17 showed EF 45-50%. Echo report that was done 11/28/16 and it showed an EF of 40-45% along with mild MR. EF has improved from 30-35% July 2017.  RHC/LHC done 06/13/20 and showed: 1. Severe single-vessel coronary artery disease with chronic total occlusion of the proximal RCA. The distal vessel fills via left to right collaterals. 2. Mild to moderate, nonobstructive CAD involving the LAD and LCx with up to 50% stenosis. 3. Mildly elevated left and right heart filling pressures. 4. Moderate pulmonary hypertension (mean PAP 40 mmHg) with significantly elevated pulmonary vascular resistance (PVR 7.4 WU). 5. Severely reduced Fick cardiac output/index.  Admitted 06/11/20 due to chest pain and SOB. Cardiology consult obtained. Initially given IV lasix with transition to oral diuretics. RHC/LHC done with above results. Elevated troponin thought to be due to demand ischemia. NSTEMI ruled out. Discharged after 3 days. Admitted 06/07/20 due to CP and SOB. Chest CTA showed small PE's. Initially placed on heparin infustion with transition to xarelto. Given IV lasix initially as well.  Discharged after 2 days. Admitted 05/12/20 due to chest  pain and acute/ chronic HF. Initially given IV lasix with transition to oral diuretics. Chest CT done and showed decreased clot from previous PE. No aortic dissection. Mildly elevated troponin due to HF exacerbation. Discharged after 2 days. Was in the ED 05/05/20 but left without being seen. Wt Readings from Last 3 Encounters:  06/28/20 238 lb 9.6 oz (108.2 kg)  06/23/20 239 lb 9.6 oz (108.7 kg)  06/16/20 239 lb 4 oz (108.5 kg)   History of Alcohol Abuse: Reports he abused alcohol from ages [age].     Past Medical History:  Diagnosis Date  . Chest wall pain, chronic   . Chronic systolic CHF (congestive heart failure) ([place])   . Chronic Troponin Elevation   . CKD (chronic kidney disease), stage II-III   . COPD (chronic obstructive pulmonary disease) ([place])   . Diabetes mellitus without complication ([place])   . Hypertension    Resolved since weight loss  . NICM (nonischemic cardiomyopathy) ([place])    a. 03/2015 Echo: EF 45-50%; b. 12/2015 Echo: EF 20-25%; c. 02/2016 Echo: EF 30-35%; d. 11/2016 Echo: EF  40-45%; e. 06/2019 Echo: EF 30-35%; f. 11/2019 Echo: EF 25-30%, glob HK. Mild LVH. Gr2 DD. Mild-mod AI. BAE. Triv MR.  . Non-obstructive CAD (coronary artery disease)    a. 04/2015 low risk MV;  b. 12/2016 Cath: minor irregs in LAD/Diag/LCX/OM, RCA 40p/m/d.  [name] (nonsustained ventricular tachycardia) ([place])    a. 12/2015 noted on tele-->amio;  b. 12/2015 Event monitor: no VT. Atach noted.  . Obesity (BMI 30.0-34.9)   . Psoriasis   . Syncope    a. 01/2016 - felt to be vasovagal.  . Tobacco abuse    Past Surgical History:  Procedure Laterality Date  . AMPUTATION    . CARDIAC CATHETERIZATION    . FINGER AMPUTATION     Traumatic  . FINGER FRACTURE SURGERY Left   . LEFT HEART CATH AND CORONARY ANGIOGRAPHY N/A 01/06/2017   Procedure: Left Heart Cath and Coronary Angiography;  Surgeon: Wellington Hampshire, MD;  Location: [facility];  Service: Cardiovascular;  Laterality: N/A;  . RIGHT/LEFT  HEART CATH AND CORONARY ANGIOGRAPHY N/A 06/13/2020   Procedure: RIGHT/LEFT HEART CATH AND CORONARY ANGIOGRAPHY;  Surgeon: Nelva Bush, MD;  Location: [facility];  Service: Cardiovascular;  Laterality: N/A;   Family Status  Relation Name Status  . Mother  Deceased  . Father  Alive  . Ethlyn Daniels  Deceased  . MGF  Deceased   Family History  Problem Relation Age of Onset  . Diabetes Mellitus II Mother   . Hypothyroidism Mother   . Hypertension Mother   . Hypertension Father   . Gout Father   . Cancer Paternal Aunt   . Cancer Maternal Grandfather   . Diabetes Maternal Grandfather    Social History   Socioeconomic History  . Marital status: Widowed    Spouse name: Not on file  . Number of children: 1  . Years of education: 36  . Highest education level: 12th grade  Occupational History  . Occupation: disabled  Tobacco Use  . Smoking status: Former Smoker    Packs/day: 0.50    Years: 33.00    Pack years: 16.50    Types: Cigarettes    Quit date: 05/08/2020    Years since quitting: 0.1  . Smokeless tobacco: Former Network engineer  . Vaping Use: Former  Substance and Sexual Activity  . Alcohol use: Yes    Alcohol/week: 0.0 standard drinks    Comment: occassionally  . Drug use: No  . Sexual activity: Not Currently  Other Topics Concern  . Not on file  Social History Narrative   Lives at home with wife and his dad's wife.        Works sales/advanced Academic librarian; smoker; cutting; hx of alcoholism [started 25 year]; quit at age of 84 years.    Social Determinants of Health   Financial Resource Strain:   . Difficulty of Paying Living Expenses: Not on file  Food Insecurity:   . Worried About Charity fundraiser in the Last Year: Not on file  . Ran Out of Food in the Last Year: Not on file  Transportation Needs:   . Lack of Transportation (Medical): Not on file  . Lack of Transportation (Non-Medical): Not on file  Physical Activity:   . Days of Exercise per  Week: Not on file  . Minutes of Exercise per Session: Not on file  Stress:   . Feeling of Stress : Not on file  Social Connections:   . Frequency of  Communication with Friends and Family: Not on file  . Frequency of Social Gatherings with Friends and Family: Not on file  . Attends Religious Services: Not on file  . Active Member of Clubs or Organizations: Not on file  . Attends Archivist Meetings: Not on file  . Marital Status: Not on file   Outpatient Medications Prior to Visit  Medication Sig  . aspirin EC 81 MG EC tablet Take 1 tablet (81 mg total) by mouth daily. Swallow whole.  . B-D UF III MINI PEN NEEDLES 31G X 5 MM MISC 1 DOSE BY DOES NOT APPLY ROUTE 4 (FOUR) TIMES DAILY - WITH MEALS AND AT BEDTIME.  . blood glucose meter kit and supplies KIT Dispense based on patient and insurance preference. Use up to four times daily as directed. (FOR ICD-9 250.00, 250.01).  . carvedilol (COREG) 6.25 MG tablet Take 2 tablets (12.5 mg total) by mouth 2 (two) times daily with a meal.  . fenofibrate (TRICOR) 145 MG tablet Take 1 tablet (145 mg total) by mouth daily.  . Insulin Glargine (BASAGLAR KWIKPEN) 100 UNIT/ML Inject 28 Units into the skin at bedtime.  . insulin lispro (HUMALOG) 100 UNIT/ML KwikPen Inject 3 Units into the skin 3 (three) times daily before meals.  . Ipratropium-Albuterol (COMBIVENT) 20-100 MCG/ACT AERS respimat Inhale 1 puff into the lungs every 6 (six) hours.  . nitroGLYCERIN (NITROSTAT) 0.4 MG SL tablet Place 1 tablet (0.4 mg total) under the tongue every 5 (five) minutes x 3 doses as needed for chest pain.  Marland Kitchen RIVAROXABAN (XARELTO) VTE STARTER PACK (15 & 20 MG TABLETS) Follow package directions: Take one 42m tablet by mouth twice a day. On day 22, switch to one 217mtablet once a day. Take with food.  . rosuvastatin (CRESTOR) 20 MG tablet Take 1 tablet (20 mg total) by mouth at bedtime.  . sacubitril-valsartan (ENTRESTO) 49-51 MG Take 1 tablet by mouth 2 (two)  times daily.  . Marland Kitchenpironolactone (ALDACTONE) 25 MG tablet Take 1 tablet (25 mg total) by mouth 2 (two) times daily.  . Marland Kitchenorsemide (DEMADEX) 20 MG tablet Take 3 tablets (60 mg total) by mouth 2 (two) times daily. (Patient taking differently: Take 60 mg by mouth daily. )   No facility-administered medications prior to visit.   Allergies  Allergen Reactions  . Metformin And Related Nausea And Vomiting  . Prednisone Other (See Comments)    Reaction: Hallucinations    . Zantac [Ranitidine Hcl] Diarrhea and Nausea Only    Night sweats    Immunization History  Administered Date(s) Administered  . Influenza,inj,Quad PF,6+ Mos 05/19/2015, 08/22/2017, 06/27/2019, 06/23/2020  . Pneumococcal Polysaccharide-23 01/06/2016    Health Maintenance  Topic Date Due  . OPHTHALMOLOGY EXAM  Never done  . URINE MICROALBUMIN  Never done  . COVID-19 Vaccine (1) Never done  . TETANUS/TDAP  Never done  . HEMOGLOBIN A1C  12/21/2020  . FOOT EXAM  03/02/2021  . INFLUENZA VACCINE  Completed  . PNEUMOCOCCAL POLYSACCHARIDE VACCINE AGE 49-64 HIGH RISK  Completed  . Hepatitis C Screening  Completed  . HIV Screening  Completed    Patient Care Team: PoPaulene Floors PCP - General (Physician Assistant) GoMinna MerrittsMD as PCP - Cardiology (Cardiology) HaAlisa GraffFNP as Nurse Practitioner (Family Medicine) GoMinna MerrittsMD as Consulting Physician (Cardiology)  Review of Systems  Constitutional: Negative.   HENT: Negative.   Eyes: Negative.   Respiratory: Negative.   Cardiovascular: Negative.  Gastrointestinal: Negative.   Endocrine: Negative.   Genitourinary: Negative.   Skin: Negative.   Allergic/Immunologic: Negative.   Neurological: Negative.   Hematological: Negative.   Psychiatric/Behavioral: Negative.       Objective    BP 132/83 (BP Location: Left Arm, Patient Position: Sitting, Cuff Size: Large)   Pulse 81   Temp 97.8 F (36.6 C) (Oral)   Ht 6' (1.829 m)   Wt  239 lb 9.6 oz (108.7 kg)   SpO2 98%   BMI 32.50 kg/m  Physical Exam Constitutional:      Appearance: Normal appearance. He is normal weight.  HENT:     Right Ear: Tympanic membrane, ear canal and external ear normal.     Left Ear: Tympanic membrane, ear canal and external ear normal.  Cardiovascular:     Rate and Rhythm: Normal rate and regular rhythm.     Pulses: Normal pulses.     Heart sounds: Normal heart sounds.  Pulmonary:     Effort: Pulmonary effort is normal.     Breath sounds: Normal breath sounds.  Abdominal:     General: Abdomen is flat. Bowel sounds are normal.     Palpations: Abdomen is soft.  Skin:    General: Skin is warm and dry.  Neurological:     General: No focal deficit present.     Mental Status: He is alert and oriented to person, place, and time.  Psychiatric:        Mood and Affect: Mood normal.        Behavior: Behavior normal.      Depression Screen PHQ 2/9 Scores 06/23/2020 01/27/2018 12/01/2017 09/15/2017  PHQ - 2 Score 0 0 3 1  PHQ- 9 Score 1 - 17 -   Results for orders placed or performed in visit on 06/23/20  POCT glycosylated hemoglobin (Hb A1C)  Result Value Ref Range   Hemoglobin A1C 9.8 (A) 4.0 - 5.6 %   HbA1c POC (<> result, manual entry)     HbA1c, POC (prediabetic range)     HbA1c, POC (controlled diabetic range)     Est. average glucose Bld gHb Est-mCnc 235     Assessment & Plan     1. Type 2 diabetes mellitus with hyperlipidemia ([place])  Start trulicity, demonstrated first dose in office. Stop meal time insulin. Hopefully we can get him better glycemic control and less insulin requirements. He is on a statin. Ordered eye exam and microalbumin. Follow up 1-3 months for repeat A1c and medication adjustment.   - POCT glycosylated hemoglobin (Hb A1C) - Urine Microalbumin w/creat. ratio - C-peptide - Dulaglutide (TRULICITY) 6.06 TK/1.6WF SOPN; Inject 0.75 mg into the skin once a week.  Dispense: 2 mL; Refill: 2 - Ambulatory  referral to Ophthalmology  2. Chronic obstructive pulmonary disease, unspecified COPD type ([place])  Continue combivent.   3. Acute on chronic HFrEF (heart failure with reduced ejection fraction) (HCC)  History of multiple Mis, now with heart failure with reduced EF. Followed by [facility] cardiology. Currently on coreg, entresto, lasix and spironolactone. Continue follow up with cardiology.   4. Other pulmonary embolism without acute cor pulmonale, unspecified chronicity (HCC)  Recently found during hospitalization for most recent chest pain. Is on xarelto, refer to hematology for further eval.   - Ambulatory referral to Hematology  5. Need for influenza vaccination  - Flu Vaccine QUAD 6+ mos PF IM (Fluarix Quad PF)   No follow-ups on file.     I, Trinna Post,  PA-C, have reviewed all documentation for this visit. The documentation on 06/28/20 for the exam, diagnosis, procedures, and orders are all accurate and complete.  The entirety of the information documented in the History of Present Illness, Review of Systems and Physical Exam were personally obtained by me. Portions of this information were initially documented by [facility] and reviewed by me for thoroughness and accuracy.     Paulene Floor  [facility] [phone] (phone) [phone] (fax)  [place]

## 2020-06-23 NOTE — Telephone Encounter (Signed)
Returned patient's call regarding his BP. Today it's 143/104 which is slightly better from yesterday's readings where the top number was 160-170's. Yesterday we increased his carvedilol to 12.5mg  BID by him taking 2 of his 6.25mg  tablets twice daily.   Patient advised to continue that dose and call me back on Monday 06/26/20. He does see PCP later today and depending on what his BP looks like in the office, further adjustments may need to be made.   Patient was comfortable with this plan.

## 2020-06-27 ENCOUNTER — Encounter: Payer: Self-pay | Admitting: Internal Medicine

## 2020-06-27 NOTE — Progress Notes (Signed)
Patient called/ pre- screened for initial oncology appointment, Concerns of ocasional nausea and lightheadedness.

## 2020-06-28 ENCOUNTER — Inpatient Hospital Stay: Payer: Medicaid Other

## 2020-06-28 ENCOUNTER — Inpatient Hospital Stay: Payer: Medicaid Other | Attending: Internal Medicine | Admitting: Internal Medicine

## 2020-06-28 ENCOUNTER — Encounter (INDEPENDENT_AMBULATORY_CARE_PROVIDER_SITE_OTHER): Payer: Self-pay

## 2020-06-28 ENCOUNTER — Encounter: Payer: Self-pay | Admitting: Internal Medicine

## 2020-06-28 ENCOUNTER — Other Ambulatory Visit: Payer: Self-pay

## 2020-06-28 DIAGNOSIS — Z9119 Patient's noncompliance with other medical treatment and regimen: Secondary | ICD-10-CM | POA: Diagnosis not present

## 2020-06-28 DIAGNOSIS — I130 Hypertensive heart and chronic kidney disease with heart failure and stage 1 through stage 4 chronic kidney disease, or unspecified chronic kidney disease: Secondary | ICD-10-CM | POA: Insufficient documentation

## 2020-06-28 DIAGNOSIS — E1122 Type 2 diabetes mellitus with diabetic chronic kidney disease: Secondary | ICD-10-CM | POA: Insufficient documentation

## 2020-06-28 DIAGNOSIS — N183 Chronic kidney disease, stage 3 unspecified: Secondary | ICD-10-CM | POA: Diagnosis not present

## 2020-06-28 DIAGNOSIS — Z7901 Long term (current) use of anticoagulants: Secondary | ICD-10-CM | POA: Insufficient documentation

## 2020-06-28 DIAGNOSIS — I2699 Other pulmonary embolism without acute cor pulmonale: Secondary | ICD-10-CM | POA: Insufficient documentation

## 2020-06-28 DIAGNOSIS — Z794 Long term (current) use of insulin: Secondary | ICD-10-CM | POA: Insufficient documentation

## 2020-06-28 DIAGNOSIS — Z87891 Personal history of nicotine dependence: Secondary | ICD-10-CM | POA: Insufficient documentation

## 2020-06-28 DIAGNOSIS — E1165 Type 2 diabetes mellitus with hyperglycemia: Secondary | ICD-10-CM | POA: Insufficient documentation

## 2020-06-28 MED ORDER — RIVAROXABAN 20 MG PO TABS: 20.0000 mg | ORAL_TABLET | Freq: Every day | ORAL | 3 refills | Status: DC

## 2020-06-28 NOTE — Progress Notes (Signed)
[place]ander CONSULT NOTE  Patient Care Team: Paulene Floor as PCP - General (Physician Assistant) Minna Merritts, MD as PCP - Cardiology (Cardiology) Alisa Graff, FNP as Nurse Practitioner (Family Medicine) Minna Merritts, MD as Consulting Physician (Cardiology)  CHIEF COMPLAINTS/PURPOSE OF CONSULTATION: DVT/PE  # MAY 2021-acute PE right upper lower lobe/ right lower extremity DVT-unprovoked on xarelto x stopped 2 months [cost/insurance issues]; October 2021- Right lobar Artery- on xarelto   # CAD; chronic systolic CHF (EF of 35 to 40% in October 2021; Dr.Gollan/Dr.Klein; [place]) ; HTN; DM-2 [NOV 2021-A1c- 9.8; poorly controlled]; CKD- stage-III; smoker- trying to cut down [OCT 2021];   Oncology History   No history exists.     HISTORY OF PRESENTING ILLNESS:  Gabriel [name]. 49 y.o.  male with history of multiple medical problems including CAD CHF hypertension hyperlipidemia type 2 diabetes poorly controlled CKD-in the recent right lower extremity DVT/PE has been referred to [place] for further evaluation recommendations for DVT/PE.  Patient states that he had been diagnosed with acute PE right lung in May 2021.  Because of cost issues has insurance patient had been only on Xarelto for 2 months.  He was again diagnosed with a PE of the right lower lobar arteries on a scan in September 2021.  Patient has been on Xarelto since then.  With regards risk factors: Long distance travel-none Immobilization/trauma: none Previous history of DVT/PE: none Family history: none  Patient states that he is trying to quit smoking.   Review of Systems  Constitutional: Positive for malaise/fatigue. Negative for chills, diaphoresis, fever and weight loss.  HENT: Negative for nosebleeds and sore throat.   Eyes: Negative for double vision.  Respiratory: Negative for cough, hemoptysis, sputum production, shortness of breath and wheezing.   Cardiovascular: Negative for  chest pain, palpitations, orthopnea and leg swelling.  Gastrointestinal: Negative for abdominal pain, blood in stool, constipation, diarrhea, heartburn, melena, nausea and vomiting.  Genitourinary: Negative for dysuria, frequency and urgency.  Musculoskeletal: Positive for back pain and joint pain.  Skin: Negative.  Negative for itching and rash.  Neurological: Negative for dizziness, tingling, focal weakness, weakness and headaches.  Endo/Heme/Allergies: Does not bruise/bleed easily.  Psychiatric/Behavioral: Negative for depression. The patient is not nervous/anxious and does not have insomnia.      MEDICAL HISTORY:  Past Medical History:  Diagnosis Date  . Chest wall pain, chronic   . Chronic systolic CHF (congestive heart failure) ([place])   . Chronic Troponin Elevation   . CKD (chronic kidney disease), stage II-III   . COPD (chronic obstructive pulmonary disease) ([place])   . Diabetes mellitus without complication ([place])   . Hypertension    Resolved since weight loss  . NICM (nonischemic cardiomyopathy) ([place])    a. 03/2015 Echo: EF 45-50%; b. 12/2015 Echo: EF 20-25%; c. 02/2016 Echo: EF 30-35%; d. 11/2016 Echo: EF 40-45%; e. 06/2019 Echo: EF 30-35%; f. 11/2019 Echo: EF 25-30%, glob HK. Mild LVH. Gr2 DD. Mild-mod AI. BAE. Triv MR.  . Non-obstructive CAD (coronary artery disease)    a. 04/2015 low risk MV;  b. 12/2016 Cath: minor irregs in LAD/Diag/LCX/OM, RCA 40p/m/d.  [name] (nonsustained ventricular tachycardia) ([place])    a. 12/2015 noted on tele-->amio;  b. 12/2015 Event monitor: no VT. Atach noted.  . Obesity (BMI 30.0-34.9)   . Psoriasis   . Syncope    a. 01/2016 - felt to be vasovagal.  . Tobacco abuse     SURGICAL HISTORY:  Past Surgical History:  Procedure Laterality Date  . AMPUTATION    . CARDIAC CATHETERIZATION    . FINGER AMPUTATION     Traumatic  . FINGER FRACTURE SURGERY Left   . LEFT HEART CATH AND CORONARY ANGIOGRAPHY N/A 01/06/2017   Procedure: Left Heart Cath and Coronary  Angiography;  Surgeon: Wellington Hampshire, MD;  Location: [facility];  Service: Cardiovascular;  Laterality: N/A;  . RIGHT/LEFT HEART CATH AND CORONARY ANGIOGRAPHY N/A 06/13/2020   Procedure: RIGHT/LEFT HEART CATH AND CORONARY ANGIOGRAPHY;  Surgeon: Nelva Bush, MD;  Location: [facility];  Service: Cardiovascular;  Laterality: N/A;    SOCIAL HISTORY: Social History   Socioeconomic History  . Marital status: Widowed    Spouse name: Not on file  . Number of children: 1  . Years of education: 37  . Highest education level: 12th grade  Occupational History  . Occupation: disabled  Tobacco Use  . Smoking status: Former Smoker    Packs/day: 0.50    Years: 33.00    Pack years: 16.50    Types: Cigarettes    Quit date: 05/08/2020    Years since quitting: 0.1  . Smokeless tobacco: Former Network engineer  . Vaping Use: Former  Substance and Sexual Activity  . Alcohol use: Yes    Alcohol/week: 0.0 standard drinks    Comment: occassionally  . Drug use: No  . Sexual activity: Not Currently  Other Topics Concern  . Not on file  Social History Narrative   Lives at home with wife and his dad's wife.        Works sales/advanced Academic librarian; smoker; cutting; hx of alcoholism [started 63 year]; quit at age of 62 years.    Social Determinants of Health   Financial Resource Strain:   . Difficulty of Paying Living Expenses: Not on file  Food Insecurity:   . Worried About Charity fundraiser in the Last Year: Not on file  . Ran Out of Food in the Last Year: Not on file  Transportation Needs:   . Lack of Transportation (Medical): Not on file  . Lack of Transportation (Non-Medical): Not on file  Physical Activity:   . Days of Exercise per Week: Not on file  . Minutes of Exercise per Session: Not on file  Stress:   . Feeling of Stress : Not on file  Social Connections:   . Frequency of Communication with Friends and Family: Not on file  . Frequency of Social Gatherings  with Friends and Family: Not on file  . Attends Religious Services: Not on file  . Active Member of Clubs or Organizations: Not on file  . Attends Archivist Meetings: Not on file  . Marital Status: Not on file  Intimate Partner Violence:   . Fear of Current or Ex-Partner: Not on file  . Emotionally Abused: Not on file  . Physically Abused: Not on file  . Sexually Abused: Not on file    FAMILY HISTORY: Family History  Problem Relation Age of Onset  . Diabetes Mellitus II Mother   . Hypothyroidism Mother   . Hypertension Mother   . Hypertension Father   . Gout Father   . Cancer Paternal Aunt   . Cancer Maternal Grandfather   . Diabetes Maternal Grandfather     ALLERGIES:  is allergic to metformin and related, prednisone, and zantac [ranitidine hcl].  MEDICATIONS:  Current Outpatient Medications  Medication Sig Dispense Refill  . aspirin EC  81 MG EC tablet Take 1 tablet (81 mg total) by mouth daily. Swallow whole.    . B-D UF III MINI PEN NEEDLES 31G X 5 MM MISC 1 DOSE BY DOES NOT APPLY ROUTE 4 (FOUR) TIMES DAILY - WITH MEALS AND AT BEDTIME. 200 each 0  . blood glucose meter kit and supplies KIT Dispense based on patient and insurance preference. Use up to four times daily as directed. (FOR ICD-9 250.00, 250.01). 1 each 3  . carvedilol (COREG) 6.25 MG tablet Take 2 tablets (12.5 mg total) by mouth 2 (two) times daily with a meal. 60 tablet 0  . Dulaglutide (TRULICITY) 4.08 XK/4.8JE SOPN Inject 0.75 mg into the skin once a week. 2 mL 2  . fenofibrate (TRICOR) 145 MG tablet Take 1 tablet (145 mg total) by mouth daily. 30 tablet 6  . Insulin Glargine (BASAGLAR KWIKPEN) 100 UNIT/ML Inject 28 Units into the skin at bedtime. 15 mL 2  . insulin lispro (HUMALOG) 100 UNIT/ML KwikPen Inject 3 Units into the skin 3 (three) times daily before meals. 15 mL 3  . Ipratropium-Albuterol (COMBIVENT) 20-100 MCG/ACT AERS respimat Inhale 1 puff into the lungs every 6 (six) hours. 4 g 1  .  nitroGLYCERIN (NITROSTAT) 0.4 MG SL tablet Place 1 tablet (0.4 mg total) under the tongue every 5 (five) minutes x 3 doses as needed for chest pain. 30 tablet 0  . RIVAROXABAN (XARELTO) VTE STARTER PACK (15 & 20 MG TABLETS) Follow package directions: Take one 41m tablet by mouth twice a day. On day 22, switch to one 277mtablet once a day. Take with food. 51 each 0  . rosuvastatin (CRESTOR) 20 MG tablet Take 1 tablet (20 mg total) by mouth at bedtime. 30 tablet 6  . sacubitril-valsartan (ENTRESTO) 49-51 MG Take 1 tablet by mouth 2 (two) times daily.    . Marland Kitchenpironolactone (ALDACTONE) 25 MG tablet Take 1 tablet (25 mg total) by mouth 2 (two) times daily. 60 tablet 2  . torsemide (DEMADEX) 20 MG tablet Take 3 tablets (60 mg total) by mouth 2 (two) times daily. (Patient taking differently: Take 60 mg by mouth daily. ) 180 tablet 0  . rivaroxaban (XARELTO) 20 MG TABS tablet Take 1 tablet (20 mg total) by mouth daily with supper. 30 tablet 3   No current facility-administered medications for this visit.      . Marland KitchenPHYSICAL EXAMINATION:  Vitals:   06/28/20 1337  BP: 129/88  Pulse: 95  Resp: 18  Temp: 98.2 F (36.8 C)  SpO2: 97%   Filed Weights   06/28/20 1337  Weight: 238 lb 9.6 oz (108.2 kg)    Physical Exam HENT:     Head: Normocephalic and atraumatic.     Mouth/Throat:     Pharynx: No oropharyngeal exudate.  Eyes:     Pupils: Pupils are equal, round, and reactive to light.  Cardiovascular:     Rate and Rhythm: Normal rate and regular rhythm.  Pulmonary:     Effort: No respiratory distress.     Breath sounds: No wheezing.  Abdominal:     General: Bowel sounds are normal. There is no distension.     Palpations: Abdomen is soft. There is no mass.     Tenderness: There is no abdominal tenderness. There is no guarding or rebound.  Musculoskeletal:        General: No tenderness. Normal range of motion.     Cervical back: Normal range of motion and neck supple.  Skin:  General:  Skin is warm.  Neurological:     Mental Status: He is alert and oriented to person, place, and time.  Psychiatric:        Mood and Affect: Affect normal.      LABORATORY DATA:  I have reviewed the data as listed Lab Results  Component Value Date   WBC 9.5 06/14/2020   HGB 16.6 06/14/2020   HCT 50.4 06/14/2020   MCV 85.3 06/14/2020   PLT 213 06/14/2020   Recent Labs    12/27/19 1038 12/31/19 1019 05/12/20 1227 05/12/20 1227 05/13/20 0529 05/13/20 0529 05/14/20 0505 06/07/20 1144 06/07/20 1831 06/08/20 0420 06/12/20 2009 06/13/20 0424 06/14/20 0440  NA 136   < > 140   < > 139   < > 139   < >  --    < > 136 137 137  K 4.9   < > 4.4   < > 3.4*   < > 3.6   < >  --    < > 3.8 3.9 4.2  CL 94*   < > 104   < > 101   < > 101   < >  --    < > 98 98 101  CO2 25   < > 25   < > 26   < > 27   < >  --    < > _0 GLUCOSE 339*   < > 270*   < > 267*   < > 203*   < >  --    < > 219* 214* 232*  BUN 31*   < > 17   < > 17   < > 27*   < >  --    < > 33* 29* 26*  CREATININE 1.31*   < > 1.11   < > 1.16   < > 1.18   < >  --    < > 1.44* 1.40* 1.18  CALCIUM 9.2   < > 9.0   < > 9.0   < > 9.3   < >  --    < > 8.6* 9.0 9.0  GFRNONAA 63   < > >60   < > >60   < > >60   < >  --    < > 59* >60 >60  GFRAA 73   < > >60  --  >60  --  >60  --   --   --   --   --   --   PROT 6.6  --   --   --   --   --   --   --  6.3*  --   --   --  6.7  ALBUMIN 3.3*  --   --   --   --   --   --   --  3.2*  --   --   --  3.4*  AST 22  --   --   --   --   --   --   --  25  --   --   --  21  ALT 59*  --   --   --   --   --   --   --  32  --   --   --  24  ALKPHOS 89  --   --   --   --   --   --   --  46  --   --   --  51  BILITOT 1.0  --   --   --   --   --   --   --  2.2*  --   --   --  1.5*  BILIDIR  --   --   --   --   --   --   --   --  0.3*  --   --   --   --   IBILI  --   --   --   --   --   --   --   --  1.9*  --   --   --   --    < > = values in this interval not displayed.    RADIOGRAPHIC STUDIES: I have  personally reviewed the radiological images as listed and agreed with the findings in the report. DG Chest 2 View  Result Date: 06/11/2020 CLINICAL DATA:  Chest pain EXAM: CHEST - 2 VIEW COMPARISON:  Chest x-rays dated 06/07/2020, 05/12/2020 and 12/31/2019. FINDINGS: Stable cardiomegaly. Chronic scarring/atelectasis within the LEFT mid lung region. Stable mild interstitial prominence bilaterally. Lungs are otherwise clear. No pleural effusion or pneumothorax is seen. No acute appearing osseous abnormality. Mild degenerative spurring in the mid/lower thoracic spine. IMPRESSION: 1. Stable interstitial prominence bilaterally, suggesting chronic mild CHF/volume overload. 2. No evidence of superimposed pneumonia. 3. Stable cardiomegaly. Electronically Signed   By: Franki Cabot M.D.   On: 06/11/2020 07:47   DG Chest 2 View  Result Date: 06/07/2020 CLINICAL DATA:  Chest pain and shortness of breath EXAM: CHEST - 2 VIEW COMPARISON:  Chest radiograph and chest CT May 12, 2020 FINDINGS: There is cardiomegaly with pulmonary vascular congestion. There are small joint effusions bilaterally with a degree of interstitial pulmonary edema. There is mild left midlung atelectasis. No consolidation. No adenopathy. No bone lesions. No pneumothorax. IMPRESSION: Cardiomegaly with pulmonary vascular congestion. There is a degree of interstitial pulmonary edema with small pleural effusions. The overall appearance is felt to be indicative of a degree of congestive heart failure. No consolidation. Mild atelectasis left mid lung noted. Electronically Signed   By: Lowella Grip III M.D.   On: 06/07/2020 12:13   CT Angio Chest PE W and/or Wo Contrast  Result Date: 06/07/2020 CLINICAL DATA:  Chest pain, shortness of breath EXAM: CT ANGIOGRAPHY CHEST WITH CONTRAST TECHNIQUE: Multidetector CT imaging of the chest was performed using the standard protocol during bolus administration of intravenous contrast. Multiplanar CT  image reconstructions and MIPs were obtained to evaluate the vascular anatomy. CONTRAST:  71m OMNIPAQUE IOHEXOL 350 MG/ML SOLN COMPARISON:  05/12/2020 FINDINGS: Cardiovascular: Filling defects in the lower lobe pulmonary arteries in the right lower lobe compatible with pulmonary emboli. No evidence of pulmonary emboli on the left. Scattered aortic and coronary artery calcifications. Cardiomegaly. Aorta normal caliber. Mediastinum/Nodes: Mildly enlarged mediastinal lymph nodes. No hilar or axillary adenopathy. Mediastinal lymph nodes are stable since prior study. Lungs/Pleura: Small bilateral pleural effusions, slightly increased since prior study. Mild ground-glass opacities in the mid and lower lungs, likely mild edema. Vascular congestion. Right basilar compressive atelectasis. Upper Abdomen: Imaging into the upper abdomen demonstrates no acute findings. Musculoskeletal: Chest wall soft tissues are unremarkable. No acute bony abnormality. Review of the MIP images confirms the above findings. IMPRESSION: Filling defects within right lower lobe pulmonary arteries compatible with small pulmonary emboli. Cardiomegaly, vascular congestion and mild/early edema. Small bilateral pleural effusions, increasing since prior study.  Mediastinal adenopathy, similar to prior study. Coronary artery disease. Aortic Atherosclerosis (ICD10-I70.0). Critical Value/emergent results were called by telephone at the time of interpretation on 06/07/2020 at 6:26 pm to provider [facility] , who verbally acknowledged these results. Electronically Signed   By: Rolm Baptise M.D.   On: 06/07/2020 18:27   CARDIAC CATHETERIZATION  Result Date: 06/13/2020 Conclusions: 1. Severe single-vessel coronary artery disease with chronic total occlusion of the proximal RCA.  The distal vessel fills via left to right collaterals. 2. Mild to moderate, nonobstructive CAD involving the LAD and LCx with up to 50% stenosis. 3. Mildly elevated left and  right heart filling pressures. 4. Moderate pulmonary hypertension (mean PAP 40 mmHg) with significantly elevated pulmonary vascular resistance (PVR 7.4 WU). 5. Severely reduced Fick cardiac output/index. Recommendations: 1. Continue IV diuresis for at least 1 more day. 2. Given severely reduced cardiac output, I will decrease carvedilol to 6.5 mg twice daily.  If blood pressure tolerates and renal function is stable, consider further escalation of Entresto. 3. Restart IV heparin 2 hours after TR band removal.  If no evidence of bleeding or vascular complication, the patient can be transitioned back to [facility] tomorrow. 4. 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  He was evaluated by Dr. Aundra Dubin in June but failed to follow-up since then. 5. Aggressive secondary prevention of coronary artery disease.  Favor medical management of CTO of RCA, which is new since 2018. Nelva Bush, MD [facility]   ECHOCARDIOGRAM COMPLETE  Result Date: 06/08/2020    ECHOCARDIOGRAM REPORT   Patient Name:   Gurpreet Mikhail. Date of Exam: 06/08/2020 Medical Rec #:  [id number]           Height:       72.0 in Accession #:    [id number]          Weight:       262.0 lb Date of Birth:  1971-03-19           BSA:          2.390 m Patient Age:    89 years            BP:           138/89 mmHg Patient Gender: M                   HR:           75 bpm. Exam Location:  [facility] Procedure: 2D Echo, Color Doppler, Cardiac Doppler and Intracardiac            Opacification Agent Indications:     D32.20 CHF-Acute Systolic  History:         Patient has prior history of Echocardiogram examinations, most                  recent 11/23/2019. NICM, CAD, COPD and CKD; Risk Factors:Current                  Smoker, Hypertension and Diabetes.  Sonographer:     Charmayne Sheer RDCS (AE) Referring Phys:  [id number] Athena Masse  Diagnosing Phys: [name] MD  Sonographer Comments: Suboptimal apical window and no subcostal window. Image acquisition challenging due to patient body habitus and Image acquisition challenging due to COPD. IMPRESSIONS  1. Left ventricular ejection fraction, by estimation, is 35 to 40%. The left ventricle has  moderately decreased function. The left ventricle demonstrates global hypokinesis. The left ventricular internal cavity size was moderately dilated. Left ventricular diastolic parameters are indeterminate.  2. Right ventricular systolic function is normal. The right ventricular size is normal. There is mildly elevated pulmonary artery systolic pressure. The estimated right ventricular systolic pressure is 95.6 mmHg.  3. Left atrial size was moderately dilated.  4. Mild to moderate mitral valve regurgitation. FINDINGS  Left Ventricle: Left ventricular ejection fraction, by estimation, is 35 to 40%. The left ventricle has moderately decreased function. The left ventricle demonstrates global hypokinesis. Definity contrast agent was given IV to delineate the left ventricular endocardial borders. The left ventricular internal cavity size was moderately dilated. There is no left ventricular hypertrophy. Left ventricular diastolic parameters are indeterminate. Right Ventricle: The right ventricular size is normal. No increase in right ventricular wall thickness. Right ventricular systolic function is normal. There is mildly elevated pulmonary artery systolic pressure. The tricuspid regurgitant velocity is 3.15  m/s, and with an assumed right atrial pressure of 5 mmHg, the estimated right ventricular systolic pressure is 38.7 mmHg. Left Atrium: Left atrial size was moderately dilated. Right Atrium: Right atrial size was normal in size. Pericardium: There is no evidence of pericardial effusion. Mitral Valve: The mitral valve is normal in structure. Mild to moderate mitral valve regurgitation. No evidence of mitral  valve stenosis. MV peak gradient, 5.3 mmHg. The mean mitral valve gradient is 2.0 mmHg. Tricuspid Valve: The tricuspid valve is normal in structure. Tricuspid valve regurgitation is mild . No evidence of tricuspid stenosis. Aortic Valve: The aortic valve is normal in structure. Aortic valve regurgitation is mild. No aortic stenosis is present. Aortic valve mean gradient measures 3.0 mmHg. Aortic valve peak gradient measures 6.1 mmHg. Aortic valve area, by VTI measures 3.86 cm. Pulmonic Valve: The pulmonic valve was normal in structure. Pulmonic valve regurgitation is not visualized. No evidence of pulmonic stenosis. Aorta: The aortic root is normal in size and structure. Venous: The inferior vena cava is normal in size with greater than 50% respiratory variability, suggesting right atrial pressure of 3 mmHg. IAS/Shunts: No atrial level shunt detected by color flow Doppler.  LEFT VENTRICLE PLAX 2D LVIDd:         6.18 cm LVIDs:         5.35 cm LV PW:         1.30 cm LV IVS:        1.03 cm LVOT diam:     2.70 cm LV SV:         72 LV SV Index:   30 LVOT Area:     5.73 cm  LV Volumes (MOD) LV vol d, MOD A2C: 120.0 ml LV vol d, MOD A4C: 189.0 ml LV vol s, MOD A2C: 62.2 ml LV vol s, MOD A4C: 87.2 ml LV SV MOD A2C:     57.8 ml LV SV MOD A4C:     189.0 ml LV SV MOD BP:      77.4 ml RIGHT VENTRICLE RV Basal diam:  4.35 cm LEFT ATRIUM           Index       RIGHT ATRIUM           Index LA diam:      5.50 cm 2.30 cm/m  RA Area:     22.90 cm LA Vol (A4C): 93.3 ml 39.04 ml/m RA Volume:   67.70 ml  28.33 ml/m  AORTIC VALVE  PULMONIC VALVE AV Area (Vmax):    3.74 cm    PV Vmax:       0.81 m/s AV Area (Vmean):   3.69 cm    PV Vmean:      52.300 cm/s AV Area (VTI):     3.86 cm    PV VTI:        0.124 m AV Vmax:           123.00 cm/s PV Peak grad:  2.6 mmHg AV Vmean:          83.700 cm/s PV Mean grad:  1.0 mmHg AV VTI:            0.187 m AV Peak Grad:      6.1 mmHg AV Mean Grad:      3.0 mmHg LVOT Vmax:          80.30 cm/s LVOT Vmean:        54.000 cm/s LVOT VTI:          0.126 m LVOT/AV VTI ratio: 0.67  AORTA Ao Root diam: 3.70 cm MITRAL VALVE                TRICUSPID VALVE MV Area (PHT): 5.09 cm     TR Peak grad:   39.7 mmHg MV Peak grad:  5.3 mmHg     TR Vmax:        315.00 cm/s MV Mean grad:  2.0 mmHg MV Vmax:       1.15 m/s     SHUNTS MV Vmean:      69.5 cm/s    Systemic VTI:  0.13 m MV Decel Time: 149 msec     Systemic Diam: 2.70 cm MV E velocity: 102.00 cm/s MV A velocity: 52.70 cm/s MV E/A ratio:  1.94 [name] MD Electronically signed by [name] MD Signature Date/Time: 06/08/2020/5:02:43 PM    Final     ASSESSMENT & PLAN:   Pulmonary embolism on right ([place]) # UNPROVOKED-PE [May 9179]- on eliquis x 2 months;; noncompliance sec to costs/insurance; September 2021-acute PE.  Patient currently compliant on xarelto.   # Recommend anticoagulation at least for 1 year; maybe longer/indefinite.  I would recommend hypercoagulable work-up in the near future.  Continue Xarelto 20 mg a day after finishing her current prescription.  New prescription sent.  #Congestive heart failure-on diuretics; beta-blocker ACE inhibitor as per cardiology.  Stable  #Diabetes-type II poorly controlled; October A1c 9.8; patient states to be recently compliant with his medications.  # Discussed with the patient regarding the ill effects of smoking- including but not limited to cardiac lung and vascular diseases and malignancies. Counseled against smoking; patient- interested.  Patient states that he has tried chantix; gums; patches; motivated.   Thank you, Ms.Pollack for allowing me to participate in the care of your pleasant patient. Please do not hesitate to contact me with questions or concerns in the interim.  # DISPOSITION: # follow up in 3 month; MD: Labs- cbc/cmp;d-dimer- Dr.B  All questions were answered. The patient knows to call the clinic with any problems, questions or concerns.    Cammie Sickle, MD 06/28/2020 2:32 PM

## 2020-06-28 NOTE — Assessment & Plan Note (Addendum)
#   UNPROVOKED-PE [May 5809]- on eliquis x 2 months;; noncompliance sec to costs/insurance; September 2021-acute PE.  Patient currently compliant on xarelto.   # Recommend anticoagulation at least for 1 year; maybe longer/indefinite.  I would recommend hypercoagulable work-up in the near future.  Continue Xarelto 20 mg a day after finishing her current prescription.  New prescription sent.  #Congestive heart failure-on diuretics; beta-blocker ACE inhibitor as per cardiology.  Stable  #Diabetes-type II poorly controlled; October A1c 9.8; patient states to be recently compliant with his medications.  # Discussed with the patient regarding the ill effects of smoking- including but not limited to cardiac lung and vascular diseases and malignancies. Counseled against smoking; patient- interested.  Patient states that he has tried chantix; gums; patches; motivated.   Thank you, Ms.Pollack for allowing me to participate in the care of your pleasant patient. Please do not hesitate to contact me with questions or concerns in the interim.  # DISPOSITION: # follow up in 3 month; MD: Labs- cbc/cmp;d-dimer- Dr.B

## 2020-07-06 ENCOUNTER — Ambulatory Visit (HOSPITAL_COMMUNITY)
Admission: RE | Admit: 2020-07-06 | Discharge: 2020-07-06 | Disposition: A | Discharge status: Home / Self Care | Payer: Medicaid Other | Source: Ambulatory Visit | Attending: Adult Health | Admitting: Adult Health

## 2020-07-06 ENCOUNTER — Other Ambulatory Visit: Payer: Self-pay

## 2020-07-06 ENCOUNTER — Encounter (HOSPITAL_COMMUNITY): Payer: Self-pay

## 2020-07-06 VITALS — BP 128/94 | HR 84 | Wt 237.4 lb

## 2020-07-06 DIAGNOSIS — I5022 Chronic systolic (congestive) heart failure: Secondary | ICD-10-CM | POA: Diagnosis not present

## 2020-07-06 DIAGNOSIS — R0683 Snoring: Secondary | ICD-10-CM

## 2020-07-06 DIAGNOSIS — I428 Other cardiomyopathies: Secondary | ICD-10-CM | POA: Insufficient documentation

## 2020-07-06 DIAGNOSIS — I10 Essential (primary) hypertension: Secondary | ICD-10-CM

## 2020-07-06 DIAGNOSIS — I2699 Other pulmonary embolism without acute cor pulmonale: Secondary | ICD-10-CM | POA: Diagnosis not present

## 2020-07-06 DIAGNOSIS — Z79899 Other long term (current) drug therapy: Secondary | ICD-10-CM | POA: Diagnosis not present

## 2020-07-06 DIAGNOSIS — I2510 Atherosclerotic heart disease of native coronary artery without angina pectoris: Secondary | ICD-10-CM

## 2020-07-06 DIAGNOSIS — I110 Hypertensive heart disease with heart failure: Secondary | ICD-10-CM | POA: Diagnosis not present

## 2020-07-06 DIAGNOSIS — G4733 Obstructive sleep apnea (adult) (pediatric): Secondary | ICD-10-CM | POA: Insufficient documentation

## 2020-07-06 DIAGNOSIS — Z86718 Personal history of other venous thrombosis and embolism: Secondary | ICD-10-CM | POA: Diagnosis not present

## 2020-07-06 DIAGNOSIS — Z794 Long term (current) use of insulin: Secondary | ICD-10-CM | POA: Insufficient documentation

## 2020-07-06 DIAGNOSIS — Z7982 Long term (current) use of aspirin: Secondary | ICD-10-CM | POA: Insufficient documentation

## 2020-07-06 DIAGNOSIS — Z7901 Long term (current) use of anticoagulants: Secondary | ICD-10-CM | POA: Diagnosis not present

## 2020-07-06 DIAGNOSIS — E782 Mixed hyperlipidemia: Secondary | ICD-10-CM

## 2020-07-06 DIAGNOSIS — Z87891 Personal history of nicotine dependence: Secondary | ICD-10-CM | POA: Insufficient documentation

## 2020-07-06 DIAGNOSIS — Z720 Tobacco use: Secondary | ICD-10-CM

## 2020-07-06 LAB — CBC
HCT: 56.4 % — ABNORMAL HIGH (ref 39.0–52.0)
Hemoglobin: 18.4 g/dL — ABNORMAL HIGH (ref 13.0–17.0)
MCH: 26.9 pg (ref 26.0–34.0)
MCHC: 32.6 g/dL (ref 30.0–36.0)
MCV: 82.5 fL (ref 80.0–100.0)
Platelets: 153 10*3/uL (ref 150–400)
RBC: 6.84 MIL/uL — ABNORMAL HIGH (ref 4.22–5.81)
RDW: 13.2 % (ref 11.5–15.5)
WBC: 8.1 10*3/uL (ref 4.0–10.5)
nRBC: 0 % (ref 0.0–0.2)

## 2020-07-06 LAB — BASIC METABOLIC PANEL
Anion gap: 11 (ref 5–15)
BUN: 29 mg/dL — ABNORMAL HIGH (ref 6–20)
CO2: 28 mmol/L (ref 22–32)
Calcium: 10.1 mg/dL (ref 8.9–10.3)
Chloride: 99 mmol/L (ref 98–111)
Creatinine, Ser: 1.53 mg/dL — ABNORMAL HIGH (ref 0.61–1.24)
GFR, Estimated: 55 mL/min — ABNORMAL LOW (ref >60)
Glucose, Bld: 276 mg/dL — ABNORMAL HIGH (ref 70–99)
Potassium: 4.4 mmol/L (ref 3.5–5.1)
Sodium: 138 mmol/L (ref 135–145)

## 2020-07-06 MED ORDER — DAPAGLIFLOZIN PROPANEDIOL 10 MG PO TABS: 10.0000 mg | ORAL_TABLET | Freq: Every day | ORAL | 6 refills | Status: DC

## 2020-07-06 NOTE — Progress Notes (Signed)
ReDS Vest / Clip - 07/06/20 0945      ReDS Vest / Clip   Station Marker D    Ruler Value 38    ReDS Value Range Low volume    ReDS Actual Value 35    Anatomical Comments sitting

## 2020-07-06 NOTE — Progress Notes (Signed)
PCP: Trinna Post, PA-C Cardiology: [name] HF Cardiology: Dr. Aundra Dubin  49 y.o. with history of nonischemic cardiomyopathy, HTN, and recent DVT/PE.  Patient has had a known nonischemic cardiomyopathy for a number of years.  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  Most recent echo in 4/21 showed EF 25-30%, diffuse hypokinesis. He used to drink heavily but has had rare ETOH for 7-8 years.  Rare smoking.    Admitted 5/21 with PE/DVT in setting of immobility/lack of activity with three hospital admissions in 4/21 and 5/21 prior to admission with PE/DVT.  He was placed on Eliquis.   Admitted with shortness of breath and chest pain. He had been taking his medications intermittently because he could not afford them. This was in the setting of demand ischemia and a/c systolic heart failure.Had RHC/LHC that showed severe single-vessel coronary artery disease with chronic total occlusion of the proximal RCA, low output, and elevated PVR 7.4 . Diuresed with IV lasix and transitioned to torsemide 60 mg daily. Discharge weight 239 pounds.   Today he returns for post hospital follow up. Overall feeling much better. Denies SOB/PND/Orthopnea. Mild shortness of breath walking up steps. Endorses daytime sleepiness and snoring. No dizziness/syncope. Appetite ok. No fever or chills. Weight at home 233-235  pounds. Taking all medications. He now has Medicaid and has been able to obtain all medications.  Working part time [employer]. Lives with his dad and step mom.   Labs (5/21): K 4.3, creatinine 1.27 Labs 10.2021: Creatinine 1  PMH: 1. Chronic systolic CHF: Nonischemic cardiomyopathy. - LHC (3/18) with 40% RCA stenosis.  - Echo (4/21): EF 25-30%, mild LVH, mild LV dilation.  -Echo (05/2020): EF 35-40% RV normal.  2. RLE DVT/PE: 5/21.  In setting of hospitalizations and immobility.  3. OSA 4. COPD: Prior smoker.  5. H/o NSVT 6. Type 2 diabetes 7. HTN 8. RHC  RA (mean): 9 mmHg RV (S/EDP): 57/10  mmHg PA (S/D, mean): 57/28 (40) mmHg PCWP (mean): 17 mmHg Ao sat: 98% PA sat: 61% Fick CO: 3.1 L/min Fick CI: 1.3 L/min/m^2 PVR: 7.4 Wood units  SH: Out of work.  Rare ETOH, prior heavy drinker but cut back a lot 7-8 years ago.  Smokes rarely.   FH: No h/o venous thromboembolism.  Mother with CHF later in life, cause uncertain.   ROS: All systems reviewed and negative except as per HPI.   Current Outpatient Medications  Medication Sig Dispense Refill  . aspirin EC 81 MG EC tablet Take 1 tablet (81 mg total) by mouth daily. Swallow whole.    . B-D UF III MINI PEN NEEDLES 31G X 5 MM MISC 1 DOSE BY DOES NOT APPLY ROUTE 4 (FOUR) TIMES DAILY - WITH MEALS AND AT BEDTIME. 200 each 0  . blood glucose meter kit and supplies KIT Dispense based on patient and insurance preference. Use up to four times daily as directed. (FOR ICD-9 250.00, 250.01). 1 each 3  . carvedilol (COREG) 6.25 MG tablet Take 2 tablets (12.5 mg total) by mouth 2 (two) times daily with a meal. 60 tablet 0  . Dulaglutide (TRULICITY) 4.08 XK/4.8JE SOPN Inject 0.75 mg into the skin once a week. 2 mL 2  . fenofibrate (TRICOR) 145 MG tablet Take 1 tablet (145 mg total) by mouth daily. 30 tablet 6  . Insulin Glargine (BASAGLAR KWIKPEN) 100 UNIT/ML Inject 28 Units into the skin at bedtime. 15 mL 2  . insulin lispro (HUMALOG) 100 UNIT/ML KwikPen Inject 3 Units into  the skin 3 (three) times daily before meals. 15 mL 3  . Ipratropium-Albuterol (COMBIVENT) 20-100 MCG/ACT AERS respimat Inhale 1 puff into the lungs every 6 (six) hours. 4 g 1  . RIVAROXABAN (XARELTO) VTE STARTER PACK (15 & 20 MG TABLETS) Follow package directions: Take one 67m tablet by mouth twice a day. On day 22, switch to one 261mtablet once a day. Take with food. 51 each 0  . rosuvastatin (CRESTOR) 20 MG tablet Take 1 tablet (20 mg total) by mouth at bedtime. 30 tablet 6  . sacubitril-valsartan (ENTRESTO) 49-51 MG Take 1 tablet by mouth 2 (two) times daily.    . Marland Kitchenspironolactone (ALDACTONE) 25 MG tablet Take 1 tablet (25 mg total) by mouth 2 (two) times daily. 60 tablet 2  . torsemide (DEMADEX) 20 MG tablet Take 60 mg by mouth daily.    . nitroGLYCERIN (NITROSTAT) 0.4 MG SL tablet Place 1 tablet (0.4 mg total) under the tongue every 5 (five) minutes x 3 doses as needed for chest pain. (Patient not taking: Reported on 07/06/2020) 30 tablet 0  . rivaroxaban (XARELTO) 20 MG TABS tablet Take 1 tablet (20 mg total) by mouth daily with supper. (Patient not taking: Reported on 07/06/2020) 30 tablet 3   No current facility-administered medications for this encounter.   BP (!) 128/94   Pulse 84   Wt 107.7 kg (237 lb 6.4 oz)   SpO2 97%   BMI 32.20 kg/m   Wt Readings from Last 3 Encounters:  07/06/20 107.7 kg (237 lb 6.4 oz)  06/28/20 108.2 kg (238 lb 9.6 oz)  06/23/20 108.7 kg (239 lb 9.6 oz)    ReDS Vest / Clip - 07/06/20 0945      ReDS Vest / Clip   Station Marker D    Ruler Value 38    ReDS Value Range Low volume    ReDS Actual Value 35    Anatomical Comments sitting           General:  Well appearing. No resp difficulty HEENT: normal Neck: Thick neck, supple. no JVD. Carotids 2+ bilat; no bruits. No lymphadenopathy or thryomegaly appreciated. Cor: PMI nondisplaced. Regular rate & rhythm. No rubs, gallops or murmurs. Lungs: clear Abdomen: soft, nontender, nondistended. No hepatosplenomegaly. No bruits or masses. Good bowel sounds. Extremities: no cyanosis, clubbing, rash, edema Neuro: alert & orientedx3, cranial nerves grossly intact. moves all 4 extremities w/o difficulty. Affect pleasant  EKG: NSR 92 bpm   Assessment/Plan: 1. Chronic systolic CHF: Nonischemic cardiomyopathy based on 3/18 cath.  Cause uncertain, possibly due to prior ETOH abuse versus HTN versus viral myocarditis => he reports a severe respiratory infection several years ago.  - Echo 06/08/2020 EF 35-40% RV normal -  NYHA III but has had functional improvement.  - Reds  Clip 35%. Continue torsemide 60 mg daily - Continue  Entresto 49/51 bid.   - Continue spironolactone 25 mg bid.   - Continue Coreg 25 mg bid.  - Add farxiga 10 mg daily. Check BMET today and in 7 days.  2. OSA: Has had OSA diagnosed years ago but never started CPAP. Set up sleep study.  3. PE/DVT: He was in the hospital multiple times this year with prolonged immobility.  Think this precipitated PE/DVT.   - On xarelto and has the xarelto.  4. CAD: Nonobstructive on last cath.  -No chest pain  - Continue Crestor + xarelto .  5. HTN: Stable.  6. Former Smoker Last smoked in July.  I ambulated him in the clinic and oxygen saturations were > 94%. Does not need oxygen.   Follow up  In 2 months.   Nigel Ericsson NP-C  07/06/2020

## 2020-07-06 NOTE — Patient Instructions (Signed)
Lab work done today. We will notify you of any abnormal lab work. No news is good news!  EKG done today.  START Farxiga 10mg  tab daily.  Your physician has recommended that you have a sleep study. This test records several body functions during sleep, including: brain activity, eye movement, oxygen and carbon dioxide blood levels, heart rate and rhythm, breathing rate and rhythm, the flow of air through your mouth and nose, snoring, body muscle movements, and chest and belly movement.  Please follow up with the [facility] in 2 months.  At the [facility], you and your health needs are our priority. As part of our continuing mission to provide you with exceptional heart care, we have created designated Provider Care Teams. These Care Teams include your primary Cardiologist (physician) and Advanced Practice Providers (APPs- Physician Assistants and Nurse Practitioners) who all work together to provide you with the care you need, when you need it.   You may see any of the following providers on your designated Care Team at your next follow up: Marland Kitchen Dr Glori Bickers . Dr Loralie Champagne . Darrick Grinder, NP . Lyda Jester, PA . Audry Riles, PharmD   Please be sure to bring in all your medications bottles to every appointment.

## 2020-07-10 ENCOUNTER — Telehealth: Payer: Self-pay | Admitting: Cardiovascular Disease

## 2020-07-10 NOTE — Telephone Encounter (Signed)
Patient cancelled upcoming visit and is being seen at advanced HF clinic .  Patient was told he would fu in [place] when released from care at [facility] clinc.    Please advise if different POC.

## 2020-07-12 ENCOUNTER — Ambulatory Visit: Payer: Medicaid Other | Admitting: Nurse Practitioner

## 2020-07-17 ENCOUNTER — Telehealth (HOSPITAL_COMMUNITY): Payer: Self-pay

## 2020-07-17 DIAGNOSIS — I10 Essential (primary) hypertension: Secondary | ICD-10-CM

## 2020-07-17 DIAGNOSIS — I5022 Chronic systolic (congestive) heart failure: Secondary | ICD-10-CM

## 2020-07-17 MED ORDER — CARVEDILOL 6.25 MG PO TABS
12.5000 mg | ORAL_TABLET | Freq: Two times a day (BID) | ORAL | 3 refills | Status: DC
Start: 2020-07-17 — End: 2020-08-02

## 2020-07-17 NOTE — Telephone Encounter (Signed)
Patient called to report that he has stopped taking his [place] over the weekend due to BP dropping to 90/54. He also reported that he also felt dizzy,lightheaded at that time but feels better now.

## 2020-07-17 NOTE — Telephone Encounter (Signed)
Ok to keep off [place].   Thanks Henderson Frampton NP-C  4:31 PM

## 2020-07-20 NOTE — Progress Notes (Signed)
PCP: Trinna Post, PA-C Cardiology: [name] HF Cardiology: Dr. Aundra Dubin  HPI:  49 y.o. with history of nonischemic cardiomyopathy, HTN, and DVT/PE.  Patient has had a known nonischemic cardiomyopathy for a number of years.  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  Most recent echo in 4/21 showed EF 25-30%, diffuse hypokinesis. He used to drink heavily but has had rare ETOH for 7-8 years.  Rare smoking.    Admitted 5/21 with PE/DVT in setting of immobility/lack of activity with three hospital admissions in 4/21 and 5/21 prior to admission with PE/DVT.  He was placed on Eliquis.   Admitted with shortness of breath and chest pain. He had been taking his medications intermittently because he could not afford them. This was in the setting of demand ischemia and a/c systolic heart failure.Had RHC/LHC that showed severe single-vessel coronary artery disease with chronic total occlusion of the proximal RCA, low output, and elevated PVR 7.4 . Diuresed with IV furosemide and transitioned to torsemide 60 mg daily. Discharge weight 239 pounds.   Recently presented to HF Clinic for follow up with [facility] on 07/06/20. Overall was feeling much better. Denied SOB/PND/Orthopnea. Had mild shortness of breath walking up steps. Endorsed daytime sleepiness and snoring. No dizziness/syncope. Appetite was ok. No fever or chills. Weight at home was 233-235 pounds. Was taking all medications. Reported he now has Medicaid and had been able to obtain all medications.  Working part time at [employer]. Lives with his dad and step mom.  Today he returns to HF clinic for pharmacist medication titration. At last visit with [facility], Farxiga 10 mg daily was initiated. However, he called the clinic on 07/17/20 and reported low BP at home of 90/54 with dizziness and lightheadedness and was instructed to stop the [place]. Overall he is feeling ok today. Has been out of his Entresto and spironolactone for 1 week. Also noted  he was only taking carvedilol 6.25 mg BID (prescribed 12.5 mg BID). BP in clinic elevated at 176/98 since out of his BP medications. Usually his SBPs at home are in the 130s. No dizziness or lightheadedness since stopping [place]. No chest pain or palpitations. Weight has been increasing over the last week since he has been out of the [place]. Weight at home is usually <240 lbs, but has been 245 lbs recently. No LEE on exam but states his legs "feel tight". No PND or orthopnea.    HF Medications: Carvedilol 12.5 mg BID Entresto 49/51 mg BID Spironolactone 25 mg BID Torsemide 60 mg daily  Has the patient been experiencing any side effects to the medications prescribed?  Had dizziness with [place]  Does the patient have any problems obtaining medications due to transportation or finances?   No - has [facility].   Understanding of regimen: good Understanding of indications: good Potential of compliance: fair Patient understands to avoid NSAIDs. Patient understands to avoid decongestants.    Pertinent Lab Values (07/06/20): Marland Kitchen Serum creatinine 1.53, BUN 29, Potassium 4.4, Sodium 138  Vital Signs: . Weight: 248.6 lbs (last clinic weight: 237.4 lbs) . Blood pressure: 176/98 - has been out of Entresto and spironolactone for 1 week.  Marland Kitchen Heart rate: 88   Assessment: 1. Chronic systolic CHF: Nonischemic cardiomyopathy based on 3/18 cath.  Cause uncertain, possibly due to prior ETOH abuse versus HTN versus viral myocarditis => he reports a severe respiratory infection several years ago.  - Echo 06/08/2020 EF 35-40% RV normal -  NYHA III but  has had functional improvement. Volume status mildly elevated on exam in the setting of being out of Entresto and spironolactone for 1 week.  - Continue torsemide 60 mg daily - Continue carvedilol 6.25 mg BID - Restart Entresto 49/51 mg BID and spironolactone 25 mg BID.   - Did not tolerate Farxiga due to hypotension and dizziness.  2.  OSA: Has had OSA diagnosed years ago but never started CPAP. Set up sleep study.  3. PE/DVT: He was in the hospital multiple times this year with prolonged immobility.  Think this precipitated PE/DVT.   - Continue Xarelto  4. CAD: Nonobstructive on last cath.  -No chest pain  - Continue Crestor + xarelto .  5. HTN: Stable.  6. Former Smoker Last smoked in July.    Plan: 1) Medication changes: Based on clinical presentation, vital signs and recent labs will restart Entresto and spironolactone. Refills sent to [facility].  3) Follow-up: 1 month with Dr. Aundra Dubin.    Audry Riles, PharmD, BCPS, BCCP, CPP Heart Failure Clinic Pharmacist [phone]

## 2020-07-28 MED ORDER — SPIRONOLACTONE 25 MG PO TABS: 25.0000 mg | ORAL_TABLET | Freq: Two times a day (BID) | ORAL | 11 refills | Status: DC

## 2020-07-28 MED ORDER — SACUBITRIL-VALSARTAN 49-51 MG PO TABS: 1.0000 | ORAL_TABLET | Freq: Two times a day (BID) | ORAL | 11 refills | Status: DC

## 2020-07-28 MED ORDER — TORSEMIDE 20 MG PO TABS: 60.0000 mg | ORAL_TABLET | Freq: Every day | ORAL | 11 refills | Status: DC

## 2020-07-28 NOTE — Addendum Note (Signed)
Addended by: Shela Nevin R on: 76/19/1550 09:37 AM   Modules accepted: Orders

## 2020-07-28 NOTE — Telephone Encounter (Signed)
Patient never restarted Farxiga, needed HF meds refilled.  Meds ordered this encounter  Medications   carvedilol (COREG) 6.25 MG tablet    Sig: Take 2 tablets (12.5 mg total) by mouth 2 (two) times daily with a meal.    Dispense:  360 tablet    Refill:  3   sacubitril-valsartan (ENTRESTO) 49-51 MG    Sig: Take 1 tablet by mouth 2 (two) times daily.    Dispense:  60 tablet    Refill:  11   spironolactone (ALDACTONE) 25 MG tablet    Sig: Take 1 tablet (25 mg total) by mouth 2 (two) times daily.    Dispense:  60 tablet    Refill:  11   torsemide (DEMADEX) 20 MG tablet    Sig: Take 3 tablets (60 mg total) by mouth daily.    Dispense:  90 tablet    Refill:  11

## 2020-07-28 NOTE — Telephone Encounter (Signed)
lmtrc

## 2020-07-31 ENCOUNTER — Telehealth (HOSPITAL_COMMUNITY): Payer: Self-pay | Admitting: Pharmacy Technician

## 2020-07-31 ENCOUNTER — Other Ambulatory Visit (HOSPITAL_COMMUNITY): Payer: Self-pay | Admitting: *Deleted

## 2020-07-31 NOTE — Telephone Encounter (Signed)
Patient Advocate Encounter   Received notification from [facility] that prior authorization for Gabriel [name] is required.   Cannot submit via covermymeds, as the questions are not specific enough for an approval. Called and the representative is supposed to fax over the PA to be filled out.   Status is pending   Will continue to follow.

## 2020-08-01 NOTE — Telephone Encounter (Signed)
Advanced Heart Failure Patient Advocate Encounter  Prior Authorization for [name] has been approved.    Effective dates: 07/31/20 through 07/31/21  [name], CPhT

## 2020-08-02 ENCOUNTER — Other Ambulatory Visit: Payer: Self-pay

## 2020-08-02 ENCOUNTER — Ambulatory Visit (HOSPITAL_COMMUNITY)
Admission: RE | Admit: 2020-08-02 | Discharge: 2020-08-02 | Disposition: A | Discharge status: Home / Self Care | Payer: Medicaid Other | Source: Ambulatory Visit | Attending: Internal Medicine | Admitting: Internal Medicine

## 2020-08-02 DIAGNOSIS — I2510 Atherosclerotic heart disease of native coronary artery without angina pectoris: Secondary | ICD-10-CM | POA: Diagnosis not present

## 2020-08-02 DIAGNOSIS — I10 Essential (primary) hypertension: Secondary | ICD-10-CM

## 2020-08-02 DIAGNOSIS — Z7901 Long term (current) use of anticoagulants: Secondary | ICD-10-CM | POA: Diagnosis not present

## 2020-08-02 DIAGNOSIS — I428 Other cardiomyopathies: Secondary | ICD-10-CM | POA: Insufficient documentation

## 2020-08-02 DIAGNOSIS — Z86718 Personal history of other venous thrombosis and embolism: Secondary | ICD-10-CM | POA: Diagnosis not present

## 2020-08-02 DIAGNOSIS — Z87891 Personal history of nicotine dependence: Secondary | ICD-10-CM | POA: Insufficient documentation

## 2020-08-02 DIAGNOSIS — Z86711 Personal history of pulmonary embolism: Secondary | ICD-10-CM | POA: Diagnosis not present

## 2020-08-02 DIAGNOSIS — G4733 Obstructive sleep apnea (adult) (pediatric): Secondary | ICD-10-CM | POA: Insufficient documentation

## 2020-08-02 DIAGNOSIS — I110 Hypertensive heart disease with heart failure: Secondary | ICD-10-CM | POA: Insufficient documentation

## 2020-08-02 DIAGNOSIS — I5022 Chronic systolic (congestive) heart failure: Secondary | ICD-10-CM

## 2020-08-02 MED ORDER — SACUBITRIL-VALSARTAN 49-51 MG PO TABS: 1.0000 | ORAL_TABLET | Freq: Two times a day (BID) | ORAL | 3 refills | Status: DC

## 2020-08-02 MED ORDER — CARVEDILOL 6.25 MG PO TABS: 6.2500 mg | ORAL_TABLET | Freq: Two times a day (BID) | ORAL | 3 refills | Status: DC

## 2020-08-02 MED ORDER — SPIRONOLACTONE 25 MG PO TABS: 25.0000 mg | ORAL_TABLET | Freq: Two times a day (BID) | ORAL | 3 refills | Status: DC

## 2020-08-02 NOTE — Patient Instructions (Addendum)
It was a pleasure seeing you today!  MEDICATIONS: -No medication changes today -Call if you have questions about your medications.  NEXT APPOINTMENT: Return to clinic in 1 month with Dr. Aundra Dubin.  In general, to take care of your heart failure: -Limit your fluid intake to 2 Liters (half-gallon) per day.   -Limit your salt intake to ideally 2-3 grams (2000-3000 mg) per day. -Weigh yourself daily and record, and bring that "weight diary" to your next appointment.  (Weight gain of 2-3 pounds in 1 day typically means fluid weight.) -The medications for your heart are to help your heart and help you live longer.   -Please contact us before stopping any of your heart medications.  Call the clinic at [phone] with questions or to reschedule future appointments.

## 2020-08-07 ENCOUNTER — Encounter: Payer: Self-pay | Admitting: Emergency Medicine

## 2020-08-07 ENCOUNTER — Emergency Department
Admission: EM | Admit: 2020-08-07 | Discharge: 2020-08-07 | Disposition: A | Discharge status: Home / Self Care | Payer: Medicaid Other | Attending: Emergency Medicine | Admitting: Emergency Medicine

## 2020-08-07 ENCOUNTER — Other Ambulatory Visit: Payer: Self-pay

## 2020-08-07 DIAGNOSIS — M109 Gout, unspecified: Secondary | ICD-10-CM

## 2020-08-07 DIAGNOSIS — Z7982 Long term (current) use of aspirin: Secondary | ICD-10-CM | POA: Diagnosis not present

## 2020-08-07 DIAGNOSIS — Z7901 Long term (current) use of anticoagulants: Secondary | ICD-10-CM | POA: Insufficient documentation

## 2020-08-07 DIAGNOSIS — I5043 Acute on chronic combined systolic (congestive) and diastolic (congestive) heart failure: Secondary | ICD-10-CM | POA: Diagnosis not present

## 2020-08-07 DIAGNOSIS — I130 Hypertensive heart and chronic kidney disease with heart failure and stage 1 through stage 4 chronic kidney disease, or unspecified chronic kidney disease: Secondary | ICD-10-CM | POA: Diagnosis not present

## 2020-08-07 DIAGNOSIS — N1831 Chronic kidney disease, stage 3a: Secondary | ICD-10-CM | POA: Diagnosis not present

## 2020-08-07 DIAGNOSIS — Z794 Long term (current) use of insulin: Secondary | ICD-10-CM | POA: Diagnosis not present

## 2020-08-07 DIAGNOSIS — E1122 Type 2 diabetes mellitus with diabetic chronic kidney disease: Secondary | ICD-10-CM | POA: Diagnosis not present

## 2020-08-07 DIAGNOSIS — I2510 Atherosclerotic heart disease of native coronary artery without angina pectoris: Secondary | ICD-10-CM | POA: Diagnosis not present

## 2020-08-07 DIAGNOSIS — E1165 Type 2 diabetes mellitus with hyperglycemia: Secondary | ICD-10-CM | POA: Insufficient documentation

## 2020-08-07 DIAGNOSIS — J441 Chronic obstructive pulmonary disease with (acute) exacerbation: Secondary | ICD-10-CM | POA: Insufficient documentation

## 2020-08-07 DIAGNOSIS — M10072 Idiopathic gout, left ankle and foot: Secondary | ICD-10-CM | POA: Diagnosis not present

## 2020-08-07 DIAGNOSIS — Z87891 Personal history of nicotine dependence: Secondary | ICD-10-CM | POA: Diagnosis not present

## 2020-08-07 DIAGNOSIS — M79675 Pain in left toe(s): Secondary | ICD-10-CM | POA: Diagnosis present

## 2020-08-07 MED ORDER — OXYCODONE-ACETAMINOPHEN 7.5-325 MG PO TABS: 1.0000 | ORAL_TABLET | Freq: Four times a day (QID) | ORAL | 0 refills | Status: DC | PRN

## 2020-08-07 NOTE — ED Triage Notes (Signed)
Pt to ED via POV with c/o L great toe pain that started 3 days ago. Pt states redness and swelling at the joint. Pt states hx of gout in the past.

## 2020-08-07 NOTE — Discharge Instructions (Addendum)
Follow-up with your primary care provider if any continued problems or concerns.  Take pain medication only when you are at home and do not drive or operate machinery while taking it as it could cause drowsiness and increase your risk for injury.  Do not take extra Tylenol with this medication as it already contains Tylenol with it.

## 2020-08-07 NOTE — ED Provider Notes (Signed)
[facility] Emergency Department Provider Note   ____________________________________________   Event Date/Time   First MD Initiated Contact with Patient 08/07/20 1244     (approximate)  I have reviewed the triage vital signs and the nursing notes.   HISTORY  Chief Complaint Toe Pain   HPI Gabriel [name]. is a 49 y.o. male presents to the ED with complaint of left great toe pain that started approximately 3 days ago but became worse yesterday.  Patient has a history of gout in the past.  He has taken over-the-counter medication without any relief.  He denies any injury to his foot.  Patient has history of chronic CHF, coronary artery disease, PE for which he is on Xarelto, type 2 diabetes, COPD, and nonsustained ventricular tachycardia.  He rates pain as 10/10.     Past Medical History:  Diagnosis Date  . Chest wall pain, chronic   . Chronic systolic CHF (congestive heart failure) ([place])   . Chronic Troponin Elevation   . CKD (chronic kidney disease), stage II-III   . COPD (chronic obstructive pulmonary disease) ([place])   . Diabetes mellitus without complication ([place])   . Hypertension    Resolved since weight loss  . NICM (nonischemic cardiomyopathy) ([place])    a. 03/2015 Echo: EF 45-50%; b. 12/2015 Echo: EF 20-25%; c. 02/2016 Echo: EF 30-35%; d. 11/2016 Echo: EF 40-45%; e. 06/2019 Echo: EF 30-35%; f. 11/2019 Echo: EF 25-30%, glob HK. Mild LVH. Gr2 DD. Mild-mod AI. BAE. Triv MR.  . Non-obstructive CAD (coronary artery disease)    a. 04/2015 low risk MV;  b. 12/2016 Cath: minor irregs in LAD/Diag/LCX/OM, RCA 40p/m/d.  [name] (nonsustained ventricular tachycardia) ([place])    a. 12/2015 noted on tele-->amio;  b. 12/2015 Event monitor: no VT. Atach noted.  . Obesity (BMI 30.0-34.9)   . Psoriasis   . Syncope    a. 01/2016 - felt to be vasovagal.  . Tobacco abuse     Patient Active Problem List   Diagnosis Date Noted  . Type II diabetes mellitus with renal  manifestations ([place]) 06/11/2020  . COPD (chronic obstructive pulmonary disease) ([place]) 06/11/2020  . Leukocytosis 06/11/2020  . PE (pulmonary thromboembolism) ([place]) 06/11/2020  . Hyperglycemia   . Elevated brain natriuretic peptide (BNP) level   . Uncontrolled type 2 diabetes mellitus with hyperglycemia, with long-term current use of insulin ([place]) 05/13/2020  . Acute on chronic HFrEF (heart failure with reduced ejection fraction) ([place]) 05/12/2020  . Erectile dysfunction due to arterial insufficiency 03/15/2020  . Screening PSA (prostate specific antigen) 03/15/2020  . Erectile dysfunction 03/02/2020  . Acute pulmonary embolism ([place]) 01/03/2020  . Pulmonary embolism on right ([place])   . 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 Hemoptysis   . Costochondritis   . Acute respiratory failure with hypoxia ([place])   . Acute on chronic combined systolic (congestive) and diastolic (congestive) heart failure ([place]) 12/20/2019  . Acute on chronic heart failure ([place]) 11/22/2019  . CKD (chronic kidney disease), stage IIIa 06/26/2019  . Type 2 diabetes mellitus with hyperlipidemia ([place]) 06/26/2019  . Hypertensive urgency 06/26/2019  . Acute on chronic combined systolic and diastolic CHF (congestive heart failure) ([place]) 09/02/2017  . Other chest pain 08/24/2017  . Dizziness   . Noncompliance with medications 04/29/2017  . Back pain 03/06/2017  . Tobacco use 12/04/2016  . Gallbladder sludge 12/04/2016  . Acute on chronic systolic CHF (congestive heart failure) ([place]) 11/28/2016  . Coronary artery disease, non-occlusive 03/15/2016  .  Atypical chest pain 02/16/2016  . Orthostatic hypotension 01/23/2016  . Hypomagnesemia 01/23/2016  . Chronic systolic CHF (congestive heart failure) ([place]) 01/23/2016  . NICM (nonischemic cardiomyopathy) ([place]) 01/17/2016  . NSVT (nonsustained ventricular tachycardia) ([place])   . Troponin I above reference range   . Hyperlipidemia 05/17/2015  . COPD exacerbation ([place]) 05/17/2015   . Essential hypertension 03/31/2015    Past Surgical History:  Procedure Laterality Date  . AMPUTATION    . CARDIAC CATHETERIZATION    . FINGER AMPUTATION     Traumatic  . FINGER FRACTURE SURGERY Left   . LEFT HEART CATH AND CORONARY ANGIOGRAPHY N/A 01/06/2017   Procedure: Left Heart Cath and Coronary Angiography;  Surgeon: Wellington Hampshire, MD;  Location: [facility];  Service: Cardiovascular;  Laterality: N/A;  . RIGHT/LEFT HEART CATH AND CORONARY ANGIOGRAPHY N/A 06/13/2020   Procedure: RIGHT/LEFT HEART CATH AND CORONARY ANGIOGRAPHY;  Surgeon: Nelva Bush, MD;  Location: [facility];  Service: Cardiovascular;  Laterality: N/A;    Prior to Admission medications   Medication Sig Start Date End Date Taking? Authorizing Provider  aspirin EC 81 MG EC tablet Take 1 tablet (81 mg total) by mouth daily. Swallow whole. 06/15/20   Jennye Boroughs, MD  B-D UF III MINI PEN NEEDLES 31G X 5 MM MISC 1 DOSE BY DOES NOT APPLY ROUTE 4 (FOUR) TIMES DAILY - WITH MEALS AND AT BEDTIME. 06/12/20   Lorella Nimrod, MD  blood glucose meter kit and supplies KIT Dispense based on patient and insurance preference. Use up to four times daily as directed. (FOR ICD-9 250.00, 250.01). 06/09/20   Lorella Nimrod, MD  carvedilol (COREG) 6.25 MG tablet Take 1 tablet (6.25 mg total) by mouth 2 (two) times daily with a meal. 08/02/20   Larey Dresser, MD  Dulaglutide (TRULICITY) 4.00 QQ/7.6PP SOPN Inject 0.75 mg into the skin once a week. 06/23/20   Trinna Post, PA-C  fenofibrate (TRICOR) 145 MG tablet Take 1 tablet (145 mg total) by mouth daily. 06/09/20   Lorella Nimrod, MD  Insulin Glargine (BASAGLAR KWIKPEN) 100 UNIT/ML Inject 28 Units into the skin at bedtime. 06/09/20   Lorella Nimrod, MD  nitroGLYCERIN (NITROSTAT) 0.4 MG SL tablet Place 1 tablet (0.4 mg total) under the tongue every 5 (five) minutes x 3 doses as needed for chest pain. 06/09/20   Lorella Nimrod, MD  oxyCODONE-acetaminophen  (PERCOCET) 7.5-325 MG tablet Take 1 tablet by mouth every 6 (six) hours as needed for severe pain. 08/07/20 08/07/21  Johnn Hai, PA-C  rivaroxaban (XARELTO) 20 MG TABS tablet Take 1 tablet (20 mg total) by mouth daily with supper. 06/28/20   Cammie Sickle, MD  rosuvastatin (CRESTOR) 20 MG tablet Take 1 tablet (20 mg total) by mouth at bedtime. 06/09/20   Lorella Nimrod, MD  sacubitril-valsartan (ENTRESTO) 49-51 MG Take 1 tablet by mouth 2 (two) times daily. 08/02/20   Larey Dresser, MD  spironolactone (ALDACTONE) 25 MG tablet Take 1 tablet (25 mg total) by mouth 2 (two) times daily. 08/02/20   Larey Dresser, MD  torsemide (DEMADEX) 20 MG tablet Take 3 tablets (60 mg total) by mouth daily. 07/28/20   Larey Dresser, MD    Allergies Metformin and related, Prednisone, and Zantac [ranitidine hcl]  Family History  Problem Relation Age of Onset  . Diabetes Mellitus II Mother   . Hypothyroidism Mother   . Hypertension Mother   . Hypertension Father   . Gout Father   .  Cancer Paternal Aunt   . Cancer Maternal Grandfather   . Diabetes Maternal Grandfather     Social History Social History   Tobacco Use  . Smoking status: Former Smoker    Packs/day: 0.50    Years: 33.00    Pack years: 16.50    Types: Cigarettes    Quit date: 05/08/2020    Years since quitting: 0.2  . Smokeless tobacco: Former Network engineer  . Vaping Use: Former  Substance Use Topics  . Alcohol use: Yes    Alcohol/week: 0.0 standard drinks    Comment: occassionally  . Drug use: No    Review of Systems Constitutional: No fever/chills Eyes: No visual changes. Cardiovascular: Denies chest pain. Respiratory: Denies shortness of breath. Gastrointestinal: No abdominal pain.  No nausea, no vomiting.  Musculoskeletal: Positive left foot pain. Skin: Positive for redness. Neurological: Negative for headaches, focal weakness or numbness. ____________________________________________   PHYSICAL  EXAM:  VITAL SIGNS: ED Triage Vitals  Enc Vitals Group     BP 08/07/20 1214 (!) 129/92     Pulse Rate 08/07/20 1214 88     Resp 08/07/20 1214 20     Temp 08/07/20 1214 98.8 F (37.1 C)     Temp Source 08/07/20 1214 Oral     SpO2 08/07/20 1214 97 %     Weight 08/07/20 1213 245 lb (111.1 kg)     Height 08/07/20 1213 6' (1.829 m)     Head Circumference --      Peak Flow --      Pain Score 08/07/20 1213 10     Pain Loc --      Pain Edu? --      Excl. in [place]? --     Constitutional: Alert and oriented. Well appearing and in no acute distress. Eyes: Conjunctivae are normal.  Head: Atraumatic. Neck: No stridor.   Cardiovascular: Normal rate, regular rhythm. Grossly normal heart sounds.  Good peripheral circulation. Respiratory: Normal respiratory effort.  No retractions. Lungs CTAB. Musculoskeletal: On examination of the left foot the first MP joint is erythematous and moderately tender to palpation.  Skin is intact and no evidence of injury is noted.  Pulses present and motor sensory function intact. Neurologic:  Normal speech and language. No gross focal neurologic deficits are appreciated. No gait instability. Skin:  Skin is warm, dry and intact.  Psychiatric: Mood and affect are normal. Speech and behavior are normal.  ____________________________________________   LABS (all labs ordered are listed, but only abnormal results are displayed)  Labs Reviewed - No data to display ____________________________________________  PROCEDURES  Procedure(s) performed (including Critical Care):  Procedures   ____________________________________________   INITIAL IMPRESSION / ASSESSMENT AND PLAN / ED COURSE  As part of my medical decision making, I reviewed the following data within the electronic MEDICAL RECORD NUMBER Notes from prior ED visits and [place] Controlled Substance Database  49 year old male presents to the ED with complaint of left great toe pain for the last 3 days.  Patient  has history of gout which feels the same.  He denies any recent injury to his foot.  On exam there is no evidence of injury such as abrasion or open skin.  MP joint of the first digit is warm and markedly tender.  Physical exam was consistent with acute gout.  Was given a prescription for Percocet 7.5 as he is diabetic and also has multiple other cardiac issues.  Xarelto also prohibits him from being on NSAIDs.  Patient is to follow-up with his PCP if any continued problems or concerns.  ____________________________________________   FINAL CLINICAL IMPRESSION(S) / ED DIAGNOSES  Final diagnoses:  Acute gout involving toe of left foot, unspecified cause     ED Discharge Orders         Ordered    oxyCODONE-acetaminophen (PERCOCET) 7.5-325 MG tablet  Every 6 hours PRN        08/07/20 1318          *Please note:  Gabriel [name]. was evaluated in Emergency Department on 08/07/2020 for the symptoms described in the history of present illness. He was evaluated in the context of the global COVID-19 pandemic, which necessitated consideration that the patient might be at risk for infection with the SARS-CoV-2 virus that causes COVID-19. Institutional protocols and algorithms that pertain to the evaluation of patients at risk for COVID-19 are in a state of rapid change based on information released by regulatory bodies including the CDC and federal and state organizations. These policies and algorithms were followed during the patient's care in the ED.  Some ED evaluations and interventions may be delayed as a result of limited staffing during and the pandemic.*   Note:  This document was prepared using Dragon voice recognition software and may include unintentional dictation errors.    Johnn Hai, PA-C 08/07/20 1511    Blake Divine, MD 08/08/20 [phone]

## 2020-08-15 ENCOUNTER — Other Ambulatory Visit: Payer: Self-pay

## 2020-08-15 ENCOUNTER — Ambulatory Visit (HOSPITAL_BASED_OUTPATIENT_CLINIC_OR_DEPARTMENT_OTHER): Payer: Medicaid Other | Attending: Adult Health | Admitting: Cardiology

## 2020-08-15 DIAGNOSIS — R0683 Snoring: Secondary | ICD-10-CM | POA: Insufficient documentation

## 2020-08-15 DIAGNOSIS — G4733 Obstructive sleep apnea (adult) (pediatric): Secondary | ICD-10-CM

## 2020-08-17 ENCOUNTER — Encounter (HOSPITAL_BASED_OUTPATIENT_CLINIC_OR_DEPARTMENT_OTHER): Payer: Medicaid Other | Admitting: Cardiology

## 2020-08-22 NOTE — Procedures (Signed)
     Patient Name: Gabriel [name], Gabriel [name] Date: 08/15/2020 Gender: Male D.O.B: [birth year] Age (years): 68 Referring Provider: Darrick Grinder NP Height (inches): 72 Interpreting Physician: Fransico Him MD, ABSM Weight (lbs): 245 RPSGT: Carolin Coy BMI: 33 MRN: [id number] Neck Size: 19.00  CLINICAL INFORMATION The patient is referred for a split night study with BPAP.  MEDICATIONS Medications self-administered by patient taken the night of the study : N/A  SLEEP STUDY TECHNIQUE As per the AASM Manual for the Scoring of Sleep and Associated Events v2.3 (April 2016) with a hypopnea requiring 4% desaturations.  The channels recorded and monitored were frontal, central and occipital EEG, electrooculogram (EOG), submentalis EMG (chin), nasal and oral airflow, thoracic and abdominal wall motion, anterior tibialis EMG, snore microphone, electrocardiogram, and pulse oximetry. Bi-level positive airway pressure (BiPAP) was initiated when the patient met split night criteria and was titrated according to treat sleep-disordered breathing.  RESPIRATORY PARAMETERS Diagnostic Total AHI (/hr): 100.0 RDI (/hr):101.4  OA Index (/hr): 15.1  CA Index (/hr): 0.0 REM AHI (/hr): 40.0  NREM AHI (/hr):100.7  Supine AHI (/hr):89.2  Non-supine AHI (/hr): 106.89 Min O2 Sat (%):73.0  Mean O2 (%): 92.1  Time below 88% (min):19.8   Titration Optimal IPAP Pressure (cm): 26  Optimal EPAP Pressure (cm):22  AHI at Optimal Pressure (/hr):0.0  Min O2 at Optimal Pressure (%):95.0 Sleep % at Optimal (%):98  Supine % at Optimal (%):100   SLEEP ARCHITECTURE The study was initiated at 10:03:28 PM and terminated at 5:19:27 AM. The total recorded time was 436 minutes. EEG confirmed total sleep time was 283 minutes yielding a sleep efficiency of 64.9%. Sleep onset after lights out was 42.7 minutes with a REM latency of 85.5 minutes. The patient spent 36.4% of the night in stage N1 sleep, 50.2% in stage N2 sleep, 0.0%  in stage N3 and 13.4% in REM. Wake after sleep onset (WASO) was 110.3 minutes. The Arousal Index was 59.4/hour.  LEG MOVEMENT DATA The total Periodic Limb Movements of Sleep (PLMS) were 0. The PLMS index was 0.0 .  CARDIAC DATA The 2 lead EKG demonstrated sinus rhythm. The mean heart rate was 100.0 beats per minute. Other EKG findings include: PVCs.  IMPRESSIONS - Severe obstructive sleep apnea occurred during the diagnostic portion of the study (AHI = 100.0 /hour). An optimal PAP pressure could not be selected for this patient due to ongoing respiratory events.  - No significant central sleep apnea occurred during the diagnostic portion of the study (CAI = 0.0/hour). - Mild oxygen desaturation was noted during the diagnostic portion of the study (Min O2 = 73.0%). - The patient snored with loud snoring volume during the diagnostic portion of the study. - EKG findings include PVCs. - Clinically significant periodic limb movements of sleep did not occur during the study.  DIAGNOSIS - Obstructive Sleep Apnea (G47.33)  RECOMMENDATIONS - Recommend full night BiPAP titration.  May need BiPAP S/T.  - Avoid alcohol, sedatives and other CNS depressants that may worsen sleep apnea and disrupt normal sleep architecture. - Sleep hygiene should be reviewed to assess factors that may improve sleep quality. - Weight management and regular exercise should be initiated or continued.  [Electronically signed] 08/22/2020 01:08 PM  Fransico Him MD, [place], American Board of Sleep Medicine   NPI: [id number]

## 2020-08-23 ENCOUNTER — Ambulatory Visit: Payer: Medicaid Other | Admitting: Physician Assistant

## 2020-08-23 ENCOUNTER — Other Ambulatory Visit: Payer: Self-pay

## 2020-08-29 ENCOUNTER — Telehealth: Payer: Self-pay | Admitting: *Deleted

## 2020-08-29 DIAGNOSIS — G4733 Obstructive sleep apnea (adult) (pediatric): Secondary | ICD-10-CM

## 2020-08-29 NOTE — Telephone Encounter (Signed)
Informed patient of sleep study results and patient understanding was verbalized. Patient understands his sleep study showed they have sleep apnea and due to severity had unsuccessful CPAP titration. Please set up BiPAP titration in the sleep lab.   bipap titration sent to sleep pool

## 2020-08-29 NOTE — Telephone Encounter (Signed)
-----   Message from Gabriel Margarita, MD sent at 08/22/2020  1:11 PM EST ----- Please let patient know that they have sleep apnea and due to severity had unsuccessful CPAP titration. Please set up BiPAP titration in the sleep lab.

## 2020-08-30 ENCOUNTER — Telehealth: Payer: Self-pay | Admitting: *Deleted

## 2020-08-30 NOTE — Telephone Encounter (Signed)
-----   Message from Traci R Turner, MD sent at 08/22/2020  1:11 PM EST ----- Please let patient know that they have sleep apnea and due to severity had unsuccessful CPAP titration. Please set up BiPAP titration in the sleep lab.  

## 2020-08-30 NOTE — Telephone Encounter (Signed)
Informed patient of sleep study results and patient understanding was verbalized. Patient understands her sleep study showed they have sleep apnea and due to severity had unsuccessful CPAP titration. Please set up BiPAP titration in the sleep lab.    BIPAP titration sent to sleep pool.  Left detailed message on voicemail and informed patient to call back with questions

## 2020-09-06 ENCOUNTER — Encounter (HOSPITAL_COMMUNITY): Payer: Medicaid Other | Admitting: Cardiology

## 2020-09-07 ENCOUNTER — Telehealth: Payer: Self-pay | Admitting: *Deleted

## 2020-09-07 NOTE — Telephone Encounter (Signed)
PA request and clinicals for BIPAP titration faxed to [facility] @ [phone]. Per Hunt Oris we are not in network with this insurance.  [employer] rep informed of this. She states that Sleep studies only require a pre cert when the practice is not in network.

## 2020-09-20 ENCOUNTER — Telehealth: Payer: Self-pay | Admitting: *Deleted

## 2020-09-20 NOTE — Telephone Encounter (Addendum)
Patient is scheduled for lab study on 10/26/20. Patient understands his sleep study will be done at [facility] sleep lab. Patient understands he will receive a sleep packet in a week or so. Patient understands to call if he does not receive the sleep packet in a timely manner. Could not reach patient by phone.

## 2020-09-20 NOTE — Telephone Encounter (Signed)
Staff message sent to Gabriel [name] has been received. Appointment has been made please contact patient to give appointment details. I received a call from Gabriel [name] with sleep lab Auth was sent to them. She scheduled patient's appointment. [employer] Gabriel [name] Auth # [id number].

## 2020-09-27 ENCOUNTER — Inpatient Hospital Stay: Payer: Medicaid Other | Attending: Internal Medicine

## 2020-09-27 ENCOUNTER — Inpatient Hospital Stay: Payer: Medicaid Other | Admitting: Internal Medicine

## 2020-09-28 ENCOUNTER — Other Ambulatory Visit: Payer: Self-pay | Admitting: Physician Assistant

## 2020-09-28 DIAGNOSIS — E785 Hyperlipidemia, unspecified: Secondary | ICD-10-CM

## 2020-09-28 DIAGNOSIS — E1169 Type 2 diabetes mellitus with other specified complication: Secondary | ICD-10-CM

## 2020-10-04 ENCOUNTER — Encounter: Payer: Self-pay | Admitting: *Deleted

## 2020-10-04 NOTE — Telephone Encounter (Signed)
Unable to reach patient by phone so sent a letter.

## 2020-10-26 ENCOUNTER — Ambulatory Visit (HOSPITAL_BASED_OUTPATIENT_CLINIC_OR_DEPARTMENT_OTHER): Payer: Medicaid Other | Attending: Cardiology | Admitting: Cardiology

## 2020-10-26 ENCOUNTER — Other Ambulatory Visit: Payer: Self-pay

## 2020-10-26 DIAGNOSIS — G4733 Obstructive sleep apnea (adult) (pediatric): Secondary | ICD-10-CM | POA: Diagnosis not present

## 2020-10-27 ENCOUNTER — Emergency Department: Payer: Medicaid Other

## 2020-10-27 ENCOUNTER — Observation Stay
Admission: EM | Admit: 2020-10-27 | Discharge: 2020-10-28 | Disposition: A | Discharge status: Home / Self Care | Payer: Medicaid Other | Attending: Internal Medicine | Admitting: Internal Medicine

## 2020-10-27 ENCOUNTER — Encounter: Payer: Self-pay | Admitting: Emergency Medicine

## 2020-10-27 ENCOUNTER — Other Ambulatory Visit: Payer: Self-pay

## 2020-10-27 DIAGNOSIS — R079 Chest pain, unspecified: Secondary | ICD-10-CM | POA: Diagnosis not present

## 2020-10-27 DIAGNOSIS — I2510 Atherosclerotic heart disease of native coronary artery without angina pectoris: Secondary | ICD-10-CM | POA: Diagnosis not present

## 2020-10-27 DIAGNOSIS — Z79899 Other long term (current) drug therapy: Secondary | ICD-10-CM | POA: Diagnosis not present

## 2020-10-27 DIAGNOSIS — J301 Allergic rhinitis due to pollen: Secondary | ICD-10-CM | POA: Diagnosis not present

## 2020-10-27 DIAGNOSIS — E119 Type 2 diabetes mellitus without complications: Secondary | ICD-10-CM | POA: Insufficient documentation

## 2020-10-27 DIAGNOSIS — N1831 Chronic kidney disease, stage 3a: Secondary | ICD-10-CM | POA: Diagnosis not present

## 2020-10-27 DIAGNOSIS — I248 Other forms of acute ischemic heart disease: Secondary | ICD-10-CM | POA: Diagnosis not present

## 2020-10-27 DIAGNOSIS — Z87891 Personal history of nicotine dependence: Secondary | ICD-10-CM | POA: Diagnosis not present

## 2020-10-27 DIAGNOSIS — I5023 Acute on chronic systolic (congestive) heart failure: Secondary | ICD-10-CM | POA: Diagnosis not present

## 2020-10-27 DIAGNOSIS — Z7901 Long term (current) use of anticoagulants: Secondary | ICD-10-CM | POA: Diagnosis not present

## 2020-10-27 DIAGNOSIS — E785 Hyperlipidemia, unspecified: Secondary | ICD-10-CM | POA: Diagnosis present

## 2020-10-27 DIAGNOSIS — Z20822 Contact with and (suspected) exposure to covid-19: Secondary | ICD-10-CM | POA: Insufficient documentation

## 2020-10-27 DIAGNOSIS — I130 Hypertensive heart and chronic kidney disease with heart failure and stage 1 through stage 4 chronic kidney disease, or unspecified chronic kidney disease: Secondary | ICD-10-CM | POA: Insufficient documentation

## 2020-10-27 DIAGNOSIS — Z794 Long term (current) use of insulin: Secondary | ICD-10-CM | POA: Insufficient documentation

## 2020-10-27 DIAGNOSIS — Z86711 Personal history of pulmonary embolism: Secondary | ICD-10-CM | POA: Diagnosis not present

## 2020-10-27 DIAGNOSIS — R0789 Other chest pain: Secondary | ICD-10-CM | POA: Diagnosis present

## 2020-10-27 DIAGNOSIS — E1129 Type 2 diabetes mellitus with other diabetic kidney complication: Secondary | ICD-10-CM | POA: Diagnosis present

## 2020-10-27 DIAGNOSIS — I5043 Acute on chronic combined systolic (congestive) and diastolic (congestive) heart failure: Secondary | ICD-10-CM | POA: Diagnosis not present

## 2020-10-27 DIAGNOSIS — I252 Old myocardial infarction: Secondary | ICD-10-CM | POA: Diagnosis not present

## 2020-10-27 DIAGNOSIS — I509 Heart failure, unspecified: Secondary | ICD-10-CM

## 2020-10-27 DIAGNOSIS — J449 Chronic obstructive pulmonary disease, unspecified: Secondary | ICD-10-CM | POA: Diagnosis not present

## 2020-10-27 DIAGNOSIS — I10 Essential (primary) hypertension: Secondary | ICD-10-CM | POA: Diagnosis not present

## 2020-10-27 DIAGNOSIS — R778 Other specified abnormalities of plasma proteins: Secondary | ICD-10-CM

## 2020-10-27 DIAGNOSIS — I25118 Atherosclerotic heart disease of native coronary artery with other forms of angina pectoris: Secondary | ICD-10-CM | POA: Diagnosis present

## 2020-10-27 DIAGNOSIS — I2699 Other pulmonary embolism without acute cor pulmonale: Secondary | ICD-10-CM | POA: Diagnosis present

## 2020-10-27 DIAGNOSIS — E1169 Type 2 diabetes mellitus with other specified complication: Secondary | ICD-10-CM

## 2020-10-27 DIAGNOSIS — R7989 Other specified abnormal findings of blood chemistry: Secondary | ICD-10-CM | POA: Diagnosis present

## 2020-10-27 DIAGNOSIS — N183 Chronic kidney disease, stage 3 unspecified: Secondary | ICD-10-CM | POA: Diagnosis present

## 2020-10-27 DIAGNOSIS — E782 Mixed hyperlipidemia: Secondary | ICD-10-CM | POA: Diagnosis present

## 2020-10-27 HISTORY — DX: Acute myocardial infarction, unspecified: I21.9

## 2020-10-27 LAB — APTT: aPTT: 27 seconds (ref 24–36)

## 2020-10-27 LAB — CBC
HCT: 48 % (ref 39.0–52.0)
Hemoglobin: 15.8 g/dL (ref 13.0–17.0)
MCH: 28.2 pg (ref 26.0–34.0)
MCHC: 32.9 g/dL (ref 30.0–36.0)
MCV: 85.7 fL (ref 80.0–100.0)
Platelets: 209 10*3/uL (ref 150–400)
RBC: 5.6 MIL/uL (ref 4.22–5.81)
RDW: 13.7 % (ref 11.5–15.5)
WBC: 7.5 10*3/uL (ref 4.0–10.5)
nRBC: 0 % (ref 0.0–0.2)

## 2020-10-27 LAB — HIV ANTIBODY (ROUTINE TESTING W REFLEX): HIV Screen 4th Generation wRfx: NONREACTIVE

## 2020-10-27 LAB — COMPREHENSIVE METABOLIC PANEL
ALT: 22 U/L (ref 0–44)
AST: 23 U/L (ref 15–41)
Albumin: 3.7 g/dL (ref 3.5–5.0)
Alkaline Phosphatase: 57 U/L (ref 38–126)
Anion gap: 10 (ref 5–15)
BUN: 22 mg/dL — ABNORMAL HIGH (ref 6–20)
CO2: 23 mmol/L (ref 22–32)
Calcium: 9.3 mg/dL (ref 8.9–10.3)
Chloride: 106 mmol/L (ref 98–111)
Creatinine, Ser: 1.22 mg/dL (ref 0.61–1.24)
GFR, Estimated: >60 mL/min (ref >60)
Glucose, Bld: 282 mg/dL — ABNORMAL HIGH (ref 70–99)
Potassium: 3.9 mmol/L (ref 3.5–5.1)
Sodium: 139 mmol/L (ref 135–145)
Total Bilirubin: 1.3 mg/dL — ABNORMAL HIGH (ref 0.3–1.2)
Total Protein: 7 g/dL (ref 6.5–8.1)

## 2020-10-27 LAB — HEMOGLOBIN A1C
Hgb A1c MFr Bld: 7.1 % — ABNORMAL HIGH (ref 4.8–5.6)
Mean Plasma Glucose: 157.07 mg/dL

## 2020-10-27 LAB — URINE DRUG SCREEN, QUALITATIVE (ARMC ONLY)
Amphetamines, Ur Screen: NOT DETECTED
Barbiturates, Ur Screen: NOT DETECTED
Benzodiazepine, Ur Scrn: NOT DETECTED
Cannabinoid 50 Ng, Ur ~~LOC~~: NOT DETECTED
Cocaine Metabolite,Ur ~~LOC~~: NOT DETECTED
MDMA (Ecstasy)Ur Screen: NOT DETECTED
Methadone Scn, Ur: NOT DETECTED
Opiate, Ur Screen: POSITIVE — AB
Phencyclidine (PCP) Ur S: NOT DETECTED
Tricyclic, Ur Screen: NOT DETECTED

## 2020-10-27 LAB — CBG MONITORING, ED: Glucose-Capillary: 204 mg/dL — ABNORMAL HIGH (ref 70–99)

## 2020-10-27 LAB — PROTIME-INR
INR: 1 (ref 0.8–1.2)
Prothrombin Time: 13.1 seconds (ref 11.4–15.2)

## 2020-10-27 LAB — TROPONIN I (HIGH SENSITIVITY)
Troponin I (High Sensitivity): 45 ng/L — ABNORMAL HIGH (ref <18)
Troponin I (High Sensitivity): 45 ng/L — ABNORMAL HIGH (ref <18)
Troponin I (High Sensitivity): 44 ng/L — ABNORMAL HIGH (ref <18)
Troponin I (High Sensitivity): 47 ng/L — ABNORMAL HIGH (ref <18)

## 2020-10-27 LAB — RESP PANEL BY RT-PCR (FLU A&B, COVID) ARPGX2
Influenza A by PCR: NEGATIVE
Influenza B by PCR: NEGATIVE
SARS Coronavirus 2 by RT PCR: NEGATIVE

## 2020-10-27 LAB — GLUCOSE, CAPILLARY
Glucose-Capillary: 188 mg/dL — ABNORMAL HIGH (ref 70–99)
Glucose-Capillary: 302 mg/dL — ABNORMAL HIGH (ref 70–99)

## 2020-10-27 LAB — BRAIN NATRIURETIC PEPTIDE: B Natriuretic Peptide: 510.1 pg/mL — ABNORMAL HIGH (ref 0.0–100.0)

## 2020-10-27 MED ORDER — FUROSEMIDE 10 MG/ML IJ SOLN
40.0000 mg | Freq: Once | INTRAMUSCULAR | Status: AC
  Administered 2020-10-27: 40 mg via INTRAVENOUS
  Filled 2020-10-27: qty 4

## 2020-10-27 MED ORDER — INSULIN ASPART 100 UNIT/ML ~~LOC~~ SOLN
0.0000 [IU] | Freq: Three times a day (TID) | SUBCUTANEOUS | Status: DC
  Administered 2020-10-27 – 2020-10-28 (×3): 3 [IU] via SUBCUTANEOUS
  Filled 2020-10-27 (×3): qty 1

## 2020-10-27 MED ORDER — IPRATROPIUM-ALBUTEROL 20-100 MCG/ACT IN AERS
1.0000 | INHALATION_SPRAY | Freq: Four times a day (QID) | RESPIRATORY_TRACT | Status: DC
  Administered 2020-10-27 – 2020-10-28 (×2): 1 via RESPIRATORY_TRACT
  Filled 2020-10-27 (×2): qty 4

## 2020-10-27 MED ORDER — ASPIRIN EC 81 MG PO TBEC: 81.0000 mg | DELAYED_RELEASE_TABLET | Freq: Every day | ORAL | Status: DC

## 2020-10-27 MED ORDER — INSULIN GLARGINE 100 UNIT/ML ~~LOC~~ SOLN
20.0000 [IU] | Freq: Every day | SUBCUTANEOUS | Status: DC
  Administered 2020-10-27: 20 [IU] via SUBCUTANEOUS
  Filled 2020-10-27 (×2): qty 0.2

## 2020-10-27 MED ORDER — SODIUM CHLORIDE 0.9% FLUSH
3.0000 mL | Freq: Two times a day (BID) | INTRAVENOUS | Status: DC
  Administered 2020-10-27 – 2020-10-28 (×2): 3 mL via INTRAVENOUS

## 2020-10-27 MED ORDER — ROSUVASTATIN CALCIUM 20 MG PO TABS
20.0000 mg | ORAL_TABLET | Freq: Every day | ORAL | Status: DC
Start: 2020-10-27 — End: 2020-10-27

## 2020-10-27 MED ORDER — CARVEDILOL 6.25 MG PO TABS
6.2500 mg | ORAL_TABLET | Freq: Two times a day (BID) | ORAL | Status: DC
  Administered 2020-10-27 – 2020-10-28 (×2): 6.25 mg via ORAL
  Filled 2020-10-27 (×2): qty 1

## 2020-10-27 MED ORDER — ROSUVASTATIN CALCIUM 10 MG PO TABS
40.0000 mg | ORAL_TABLET | Freq: Every day | ORAL | Status: DC
  Administered 2020-10-27: 40 mg via ORAL
  Filled 2020-10-27: qty 4
  Filled 2020-10-27: qty 2

## 2020-10-27 MED ORDER — NITROGLYCERIN 0.4 MG SL SUBL
0.4000 mg | SUBLINGUAL_TABLET | SUBLINGUAL | Status: DC | PRN
  Administered 2020-10-28: 0.4 mg via SUBLINGUAL
  Filled 2020-10-27: qty 1

## 2020-10-27 MED ORDER — FUROSEMIDE 10 MG/ML IJ SOLN
40.0000 mg | Freq: Two times a day (BID) | INTRAMUSCULAR | Status: DC
  Administered 2020-10-27 – 2020-10-28 (×2): 40 mg via INTRAVENOUS
  Filled 2020-10-27 (×2): qty 4

## 2020-10-27 MED ORDER — HYDRALAZINE HCL 20 MG/ML IJ SOLN: 5.0000 mg | INTRAMUSCULAR | Status: DC | PRN

## 2020-10-27 MED ORDER — INSULIN ASPART 100 UNIT/ML ~~LOC~~ SOLN
0.0000 [IU] | Freq: Every day | SUBCUTANEOUS | Status: DC
  Administered 2020-10-27: 4 [IU] via SUBCUTANEOUS
  Filled 2020-10-27: qty 1

## 2020-10-27 MED ORDER — ACETAMINOPHEN 325 MG PO TABS
650.0000 mg | ORAL_TABLET | Freq: Four times a day (QID) | ORAL | Status: DC | PRN
  Administered 2020-10-27: 650 mg via ORAL
  Filled 2020-10-27: qty 2

## 2020-10-27 MED ORDER — NITROGLYCERIN 0.4 MG SL SUBL
0.4000 mg | SUBLINGUAL_TABLET | Freq: Once | SUBLINGUAL | Status: AC
  Administered 2020-10-27: 0.4 mg via SUBLINGUAL
  Filled 2020-10-27: qty 1

## 2020-10-27 MED ORDER — ALBUTEROL SULFATE HFA 108 (90 BASE) MCG/ACT IN AERS
2.0000 | INHALATION_SPRAY | RESPIRATORY_TRACT | Status: DC | PRN
  Filled 2020-10-27: qty 6.7

## 2020-10-27 MED ORDER — ONDANSETRON HCL 4 MG/2ML IJ SOLN: 4.0000 mg | Freq: Three times a day (TID) | INTRAMUSCULAR | Status: DC | PRN

## 2020-10-27 MED ORDER — SODIUM CHLORIDE 0.9% FLUSH: 3.0000 mL | INTRAVENOUS | Status: DC | PRN

## 2020-10-27 MED ORDER — DM-GUAIFENESIN ER 30-600 MG PO TB12: 1.0000 | ORAL_TABLET | Freq: Two times a day (BID) | ORAL | Status: DC | PRN

## 2020-10-27 MED ORDER — RIVAROXABAN 20 MG PO TABS
20.0000 mg | ORAL_TABLET | Freq: Every day | ORAL | Status: DC
  Administered 2020-10-27: 20 mg via ORAL
  Filled 2020-10-27 (×2): qty 1

## 2020-10-27 MED ORDER — SACUBITRIL-VALSARTAN 49-51 MG PO TABS
1.0000 | ORAL_TABLET | Freq: Two times a day (BID) | ORAL | Status: DC
  Administered 2020-10-27 – 2020-10-28 (×3): 1 via ORAL
  Filled 2020-10-27 (×5): qty 1

## 2020-10-27 MED ORDER — MORPHINE SULFATE (PF) 2 MG/ML IV SOLN
2.0000 mg | INTRAVENOUS | Status: DC | PRN
  Administered 2020-10-27 – 2020-10-28 (×3): 2 mg via INTRAVENOUS
  Filled 2020-10-27 (×3): qty 1

## 2020-10-27 MED ORDER — SODIUM CHLORIDE 0.9 % IV SOLN: 250.0000 mL | INTRAVENOUS | Status: DC | PRN

## 2020-10-27 MED ORDER — SPIRONOLACTONE 25 MG PO TABS
25.0000 mg | ORAL_TABLET | Freq: Two times a day (BID) | ORAL | Status: DC
  Administered 2020-10-27 – 2020-10-28 (×2): 25 mg via ORAL
  Filled 2020-10-27 (×4): qty 1

## 2020-10-27 MED ORDER — ASPIRIN 81 MG PO CHEW
324.0000 mg | CHEWABLE_TABLET | Freq: Once | ORAL | Status: AC
  Administered 2020-10-27: 324 mg via ORAL
  Filled 2020-10-27: qty 4

## 2020-10-27 MED ORDER — FENOFIBRATE 160 MG PO TABS
160.0000 mg | ORAL_TABLET | Freq: Every day | ORAL | Status: DC
  Administered 2020-10-27 – 2020-10-28 (×2): 160 mg via ORAL
  Filled 2020-10-27 (×2): qty 1

## 2020-10-27 NOTE — ED Notes (Signed)
ED Provider at bedside. 

## 2020-10-27 NOTE — ED Provider Notes (Signed)
[facility] Emergency Department Provider Note   ____________________________________________    I have reviewed the triage vital signs and the nursing notes.   HISTORY  Chief Complaint Chest Pain     HPI Gabriel [name]. is a 50 y.o. male with a history of CHF, CKD, COPD, diabetes, hypertension, CAD who presents with complaints of chest pain.  He describes it as a tightness in the center of his chest.  He reports several days ago he had sinus pressure and he took some Allegra-D and his symptoms have improved but then he developed chest discomfort and some mild shortness of breath.  Denies calf pain or leg swelling.  Has not taken anything for this besides his regular medications.  No fevers chills or cough.  Past Medical History:  Diagnosis Date  . Chest wall pain, chronic   . Chronic systolic CHF (congestive heart failure) ([place])   . Chronic Troponin Elevation   . CKD (chronic kidney disease), stage II-III   . COPD (chronic obstructive pulmonary disease) ([place])   . Diabetes mellitus without complication ([place])   . Hypertension    Resolved since weight loss  . Myocardial infarct ([place])   . NICM (nonischemic cardiomyopathy) ([place])    a. 03/2015 Echo: EF 45-50%; b. 12/2015 Echo: EF 20-25%; c. 02/2016 Echo: EF 30-35%; d. 11/2016 Echo: EF 40-45%; e. 06/2019 Echo: EF 30-35%; f. 11/2019 Echo: EF 25-30%, glob HK. Mild LVH. Gr2 DD. Mild-mod AI. BAE. Triv MR.  . Non-obstructive CAD (coronary artery disease)    a. 04/2015 low risk MV;  b. 12/2016 Cath: minor irregs in LAD/Diag/LCX/OM, RCA 40p/m/d.  [name] (nonsustained ventricular tachycardia) ([name])    a. 12/2015 noted on tele-->amio;  b. 12/2015 Event monitor: no VT. Atach noted.  . Obesity (BMI 30.0-34.9)   . Psoriasis   . Syncope    a. 01/2016 - felt to be vasovagal.  . Tobacco abuse     Patient Active Problem List   Diagnosis Date Noted  . Chest pain 10/27/2020  . Snoring 08/15/2020  . Type II diabetes  mellitus with renal manifestations ([place]) 06/11/2020  . COPD (chronic obstructive pulmonary disease) ([place]) 06/11/2020  . Leukocytosis 06/11/2020  . PE (pulmonary thromboembolism) ([place]) 06/11/2020  . Hyperglycemia   . Elevated brain natriuretic peptide (BNP) level   . Uncontrolled type 2 diabetes mellitus with hyperglycemia, with long-term current use of insulin ([place]) 05/13/2020  . Acute on chronic HFrEF (heart failure with reduced ejection fraction) ([place]) 05/12/2020  . Erectile dysfunction due to arterial insufficiency 03/15/2020  . Screening PSA (prostate specific antigen) 03/15/2020  . Erectile dysfunction 03/02/2020  . Acute pulmonary embolism ([place]) 01/03/2020  . Pulmonary embolism on right ([place])   . 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 Hemoptysis   . Costochondritis   . Acute respiratory failure with hypoxia ([place])   . Acute on chronic combined systolic (congestive) and diastolic (congestive) heart failure ([place]) 12/20/2019  . Acute on chronic heart failure ([place]) 11/22/2019  . CKD (chronic kidney disease), stage IIIa 06/26/2019  . Type 2 diabetes mellitus with hyperlipidemia ([place]) 06/26/2019  . Hypertensive urgency 06/26/2019  . Acute on chronic combined systolic and diastolic CHF (congestive heart failure) ([place]) 09/02/2017  . Other chest pain 08/24/2017  . Dizziness   . Noncompliance with medications 04/29/2017  . Back pain 03/06/2017  . Tobacco use 12/04/2016  . Gallbladder sludge 12/04/2016  . Acute on chronic systolic CHF (congestive heart failure) ([place]) 11/28/2016  . Coronary  artery disease, non-occlusive 03/15/2016  . Atypical chest pain 02/16/2016  . Orthostatic hypotension 01/23/2016  . Hypomagnesemia 01/23/2016  . Chronic systolic CHF (congestive heart failure) ([place]) 01/23/2016  . NICM (nonischemic cardiomyopathy) ([place]) 01/17/2016  . NSVT (nonsustained ventricular tachycardia) ([place])   . Troponin I above reference range   . Hyperlipidemia 05/17/2015  . COPD  exacerbation ([place]) 05/17/2015  . Essential hypertension 03/31/2015  . Elevated troponin 03/20/2015    Past Surgical History:  Procedure Laterality Date  . AMPUTATION    . CARDIAC CATHETERIZATION    . FINGER AMPUTATION     Traumatic  . FINGER FRACTURE SURGERY Left   . LEFT HEART CATH AND CORONARY ANGIOGRAPHY N/A 01/06/2017   Procedure: Left Heart Cath and Coronary Angiography;  Surgeon: Wellington Hampshire, MD;  Location: [facility];  Service: Cardiovascular;  Laterality: N/A;  . RIGHT/LEFT HEART CATH AND CORONARY ANGIOGRAPHY N/A 06/13/2020   Procedure: RIGHT/LEFT HEART CATH AND CORONARY ANGIOGRAPHY;  Surgeon: Nelva Bush, MD;  Location: [facility];  Service: Cardiovascular;  Laterality: N/A;    Prior to Admission medications   Medication Sig Start Date End Date Taking? Authorizing Provider  aspirin EC 81 MG EC tablet Take 1 tablet (81 mg total) by mouth daily. Swallow whole. 06/15/20   Jennye Boroughs, MD  B-D UF III MINI PEN NEEDLES 31G X 5 MM MISC 1 DOSE BY DOES NOT APPLY ROUTE 4 (FOUR) TIMES DAILY - WITH MEALS AND AT BEDTIME. 06/12/20   Lorella Nimrod, MD  blood glucose meter kit and supplies KIT Dispense based on patient and insurance preference. Use up to four times daily as directed. (FOR ICD-9 250.00, 250.01). 06/09/20   Lorella Nimrod, MD  carvedilol (COREG) 6.25 MG tablet Take 1 tablet (6.25 mg total) by mouth 2 (two) times daily with a meal. 08/02/20   Larey Dresser, MD  fenofibrate (TRICOR) 145 MG tablet Take 1 tablet (145 mg total) by mouth daily. 06/09/20   Lorella Nimrod, MD  Insulin Glargine (BASAGLAR KWIKPEN) 100 UNIT/ML Inject 28 Units into the skin at bedtime. 06/09/20   Lorella Nimrod, MD  nitroGLYCERIN (NITROSTAT) 0.4 MG SL tablet Place 1 tablet (0.4 mg total) under the tongue every 5 (five) minutes x 3 doses as needed for chest pain. 06/09/20   Lorella Nimrod, MD  rivaroxaban (XARELTO) 20 MG TABS tablet Take 1 tablet (20 mg total) by mouth daily with  supper. 06/28/20   Cammie Sickle, MD  rosuvastatin (CRESTOR) 20 MG tablet Take 1 tablet (20 mg total) by mouth at bedtime. 06/09/20   Lorella Nimrod, MD  sacubitril-valsartan (ENTRESTO) 49-51 MG Take 1 tablet by mouth 2 (two) times daily. 08/02/20   Larey Dresser, MD  spironolactone (ALDACTONE) 25 MG tablet Take 1 tablet (25 mg total) by mouth 2 (two) times daily. 08/02/20   Larey Dresser, MD  torsemide (DEMADEX) 20 MG tablet Take 3 tablets (60 mg total) by mouth daily. 07/28/20   Larey Dresser, MD  TRULICITY 4.09 WJ/1.9JY SOPN INJECT 0.75 MG INTO THE SKIN ONCE A WEEK. 09/28/20   Trinna Post, PA-C     Allergies Metformin and related, Prednisone, and Zantac [ranitidine hcl]  Family History  Problem Relation Age of Onset  . Diabetes Mellitus II Mother   . Hypothyroidism Mother   . Hypertension Mother   . Hypertension Father   . Gout Father   . Cancer Paternal Aunt   . Cancer Maternal Grandfather   . Diabetes Maternal Grandfather  Social History Social History   Tobacco Use  . Smoking status: Former Smoker    Packs/day: 0.50    Years: 33.00    Pack years: 16.50    Types: Cigarettes    Quit date: 05/08/2020    Years since quitting: 0.4  . Smokeless tobacco: Former Network engineer  . Vaping Use: Former  Substance Use Topics  . Alcohol use: Yes    Alcohol/week: 0.0 standard drinks    Comment: occassionally  . Drug use: No    Review of Systems  Constitutional: No fever/chills Eyes: No visual changes.  ENT: No sore throat. Cardiovascular: As above Respiratory: As above Gastrointestinal: No abdominal pain.  No nausea, no vomiting.   Genitourinary: Negative for dysuria. Musculoskeletal: Negative for back pain. Skin: Negative for rash. Neurological: Negative for headaches    ____________________________________________   PHYSICAL EXAM:  VITAL SIGNS: ED Triage Vitals [10/27/20 0729]  Enc Vitals Group     BP (!) 147/90     Pulse Rate 89      Resp (!) 22     Temp 98.1 F (36.7 C)     Temp Source Oral     SpO2 99 %     Weight 111.1 kg (245 lb)     Height 1.829 m (6')     Head Circumference      Peak Flow      Pain Score 8     Pain Loc      Pain Edu?      Excl. in [place]?     Constitutional: Alert and oriented.   Nose: No congestion/rhinnorhea. Mouth/Throat: Mucous membranes are moist.    Cardiovascular: Normal rate, regular rhythm.Kermit Balo peripheral circulation. Respiratory: Normal respiratory effort.  No retractions. Lungs CTAB. Gastrointestinal: Soft and nontender. No distention.   Musculoskeletal: No lower extremity tenderness nor edema.  Warm and well perfused Neurologic:  Normal speech and language. No gross focal neurologic deficits are appreciated.  Skin:  Skin is warm, dry and intact. No rash noted. Psychiatric: Mood and affect are normal. Speech and behavior are normal.  ____________________________________________   LABS (all labs ordered are listed, but only abnormal results are displayed)  Labs Reviewed  COMPREHENSIVE METABOLIC PANEL - Abnormal; Notable for the following components:      Result Value   Glucose, Bld 282 (*)    BUN 22 (*)    Total Bilirubin 1.3 (*)    All other components within normal limits  TROPONIN I (HIGH SENSITIVITY) - Abnormal; Notable for the following components:   Troponin I (High Sensitivity) 45 (*)    All other components within normal limits  RESP PANEL BY RT-PCR (FLU A&B, COVID) ARPGX2  CBC  APTT  PROTIME-INR  BRAIN NATRIURETIC PEPTIDE  URINE DRUG SCREEN, QUALITATIVE ([facility] ONLY)  HEMOGLOBIN A1C  TROPONIN I (HIGH SENSITIVITY)   ____________________________________________  EKG ED ECG REPORT I, Lavonia Drafts, the attending physician, personally viewed and interpreted this ECG.  Date: 10/27/2020  Rhythm: normal sinus rhythm QRS Axis: normal Intervals: normal ST/T Wave abnormalities: Very mild ST depression  laterally   ____________________________________________  RADIOLOGY  Chest x-ray reviewed by me, suspicious for CHF, pending radiology review ____________________________________________   PROCEDURES  Procedure(s) performed: No  Procedures   Critical Care performed: no ____________________________________________   INITIAL IMPRESSION / ASSESSMENT AND PLAN / ED COURSE  Pertinent labs & imaging results that were available during my care of the patient were reviewed by me and considered in my medical decision  making (see chart for details).  Patient presents with chest pain as detailed above.  Significant comorbidities as listed above, concerning for possible ACS, pneumonia, CHF, no pleurisy to suggest PE, no calf pain or swelling.  No history of DVT.  EKG with some nonspecific ST changes inferiorly and laterally, pending high sensitive troponin  Patient's chest x-ray reviewed by me, consistent with CHF exacerbation, mildly elevated troponin likely related to strain, will give IV Lasix, have discussed with the hospitalist for admission      ____________________________________________   FINAL CLINICAL IMPRESSION(S) / ED DIAGNOSES  Final diagnoses:  Acute on chronic congestive heart failure, unspecified heart failure type [facility])        Note:  This document was prepared using Dragon voice recognition software and may include unintentional dictation errors.   Lavonia Drafts, MD 10/27/20 1012

## 2020-10-27 NOTE — H&P (Signed)
History and Physical    Gabriel [name]. WVP:[id number] DOB: [birth year] DOA: 10/27/2020  Referring MD/NP/PA:   PCP: Trinna Post, PA-C   Patient coming from:  The patient is coming from home.  At baseline, pt is independent for most of ADL.        Chief Complaint: Chest pain and shortness of breath  HPI: Gabriel [name]. is a 50 y.o. male with medical history significant of CAD, HLD, HTN, DM, COPD, PE on Xarelto, sCHF, CKD-III, NSVT, former smoker, presents with CP and SOB.  Patient states that he has shortness of breath in the past 3 days, which has been progressively worsening.  Patient has dry cough, but no fever or chills.  He reports chest pain, which is located in the substernal area, 7 out of 10 severity, pressure-like, nonradiating.  Patient does not have diarrhea, nausea, vomiting, diarrhea, symptoms of UTI.  No unilateral weakness.  Patient states that he has been taking Xarelto consistently.  Last dose was last night.   ED Course: pt was found to have troponin level 45, BNP 510, INR 1.0, PTT 27, stable renal function, temperature normal, blood pressure 159/91, heart rate 89, RR 24, oxygen saturation-99% on room air.  Chest x-ray showed mild vascular congestion and cardiomegaly.  Patient is admitted to progressive bed as inpatient.  Dr. Saunders Revel of cardiology is consulted  Review of Systems:   General: no fevers, chills, no body weight gain, has fatigue HEENT: no blurry vision, hearing changes or sore throat Respiratory: Has dyspnea, coughing, no wheezing CV: Has chest pain, no palpitations GI: no nausea, vomiting, abdominal pain, diarrhea, constipation GU: no dysuria, burning on urination, increased urinary frequency, hematuria  Ext: Has tace leg edema Neuro: no unilateral weakness, numbness, or tingling, no vision change or hearing loss Skin: no rash, no skin tear. MSK: No muscle spasm, no deformity, no limitation of range of movement in spin Heme: No easy bruising.   Travel history: No recent long distant travel.  Allergy:  Allergies  Allergen Reactions  . Metformin And Related Nausea And Vomiting  . Prednisone Other (See Comments)    Reaction: Hallucinations    . Zantac [Ranitidine Hcl] Diarrhea and Nausea Only    Night sweats    Past Medical History:  Diagnosis Date  . Chest wall pain, chronic   . Chronic systolic CHF (congestive heart failure) ([place])   . Chronic Troponin Elevation   . CKD (chronic kidney disease), stage II-III   . COPD (chronic obstructive pulmonary disease) ([place])   . Diabetes mellitus without complication ([place])   . Hypertension    Resolved since weight loss  . Myocardial infarct ([place])   . NICM (nonischemic cardiomyopathy) ([place])    a. 03/2015 Echo: EF 45-50%; b. 12/2015 Echo: EF 20-25%; c. 02/2016 Echo: EF 30-35%; d. 11/2016 Echo: EF 40-45%; e. 06/2019 Echo: EF 30-35%; f. 11/2019 Echo: EF 25-30%, glob HK. Mild LVH. Gr2 DD. Mild-mod AI. BAE. Triv MR.  . Non-obstructive CAD (coronary artery disease)    a. 04/2015 low risk MV;  b. 12/2016 Cath: minor irregs in LAD/Diag/LCX/OM, RCA 40p/m/d.  [name] (nonsustained ventricular tachycardia) ([place])    a. 12/2015 noted on tele-->amio;  b. 12/2015 Event monitor: no VT. Atach noted.  . Obesity (BMI 30.0-34.9)   . Psoriasis   . Syncope    a. 01/2016 - felt to be vasovagal.  . Tobacco abuse     Past Surgical History:  Procedure Laterality Date  . AMPUTATION    .  CARDIAC CATHETERIZATION    . FINGER AMPUTATION     Traumatic  . FINGER FRACTURE SURGERY Left   . LEFT HEART CATH AND CORONARY ANGIOGRAPHY N/A 01/06/2017   Procedure: Left Heart Cath and Coronary Angiography;  Surgeon: Wellington Hampshire, MD;  Location: [facility];  Service: Cardiovascular;  Laterality: N/A;  . RIGHT/LEFT HEART CATH AND CORONARY ANGIOGRAPHY N/A 06/13/2020   Procedure: RIGHT/LEFT HEART CATH AND CORONARY ANGIOGRAPHY;  Surgeon: Nelva Bush, MD;  Location: [facility];  Service: Cardiovascular;   Laterality: N/A;    Social History:  reports that he quit smoking about 5 months ago. His smoking use included cigarettes. He has a 16.50 pack-year smoking history. He has quit using smokeless tobacco. He reports current alcohol use. He reports that he does not use drugs.  Family History:  Family History  Problem Relation Age of Onset  . Diabetes Mellitus II Mother   . Hypothyroidism Mother   . Hypertension Mother   . Hypertension Father   . Gout Father   . Cancer Paternal Aunt   . Cancer Maternal Grandfather   . Diabetes Maternal Grandfather      Prior to Admission medications   Medication Sig Start Date End Date Taking? Authorizing Provider  aspirin EC 81 MG EC tablet Take 1 tablet (81 mg total) by mouth daily. Swallow whole. 06/15/20   Jennye Boroughs, MD  B-D UF III MINI PEN NEEDLES 31G X 5 MM MISC 1 DOSE BY DOES NOT APPLY ROUTE 4 (FOUR) TIMES DAILY - WITH MEALS AND AT BEDTIME. 06/12/20   Lorella Nimrod, MD  blood glucose meter kit and supplies KIT Dispense based on patient and insurance preference. Use up to four times daily as directed. (FOR ICD-9 250.00, 250.01). 06/09/20   Lorella Nimrod, MD  carvedilol (COREG) 6.25 MG tablet Take 1 tablet (6.25 mg total) by mouth 2 (two) times daily with a meal. 08/02/20   Larey Dresser, MD  fenofibrate (TRICOR) 145 MG tablet Take 1 tablet (145 mg total) by mouth daily. 06/09/20   Lorella Nimrod, MD  Insulin Glargine (BASAGLAR KWIKPEN) 100 UNIT/ML Inject 28 Units into the skin at bedtime. 06/09/20   Lorella Nimrod, MD  nitroGLYCERIN (NITROSTAT) 0.4 MG SL tablet Place 1 tablet (0.4 mg total) under the tongue every 5 (five) minutes x 3 doses as needed for chest pain. 06/09/20   Lorella Nimrod, MD  oxyCODONE-acetaminophen (PERCOCET) 7.5-325 MG tablet Take 1 tablet by mouth every 6 (six) hours as needed for severe pain. 08/07/20 08/07/21  Johnn Hai, PA-C  rivaroxaban (XARELTO) 20 MG TABS tablet Take 1 tablet (20 mg total) by mouth daily with  supper. 06/28/20   Cammie Sickle, MD  rosuvastatin (CRESTOR) 20 MG tablet Take 1 tablet (20 mg total) by mouth at bedtime. 06/09/20   Lorella Nimrod, MD  sacubitril-valsartan (ENTRESTO) 49-51 MG Take 1 tablet by mouth 2 (two) times daily. 08/02/20   Larey Dresser, MD  spironolactone (ALDACTONE) 25 MG tablet Take 1 tablet (25 mg total) by mouth 2 (two) times daily. 08/02/20   Larey Dresser, MD  torsemide (DEMADEX) 20 MG tablet Take 3 tablets (60 mg total) by mouth daily. 07/28/20   Larey Dresser, MD  TRULICITY 0.09 FG/1.8EX SOPN INJECT 0.75 MG INTO THE SKIN ONCE A WEEK. 09/28/20   Trinna Post, [place]    Physical Exam: Vitals:   10/27/20 0729 10/27/20 0808 10/27/20 0830 10/27/20 0900  BP: (!) 147/90 (!) 147/85 (!) 151/85 Marland Kitchen)  159/91  Pulse: 89 82 81 79  Resp: (!) 22 (!) 21 (!) 22 (!) 24  Temp: 98.1 F (36.7 C)     TempSrc: Oral     SpO2: 99% 94% 96% 91%  Weight: 111.1 kg     Height: 6' (1.829 m)      General: Not in acute distress HEENT:       Eyes: PERRL, EOMI, no scleral icterus.       ENT: No discharge from the ears and nose, no pharynx injection, no tonsillar enlargement.        Neck: No JVD, no bruit, no mass felt. Heme: No neck lymph node enlargement. Cardiac: S1/S2, RRR, No murmurs, No gallops or rubs. Respiratory: has fine crackle bilaterally GI: Soft, nondistended, nontender, no rebound pain, no organomegaly, BS present. GU: No hematuria Ext: Has mild leg edema bilaterally. 1+DP/PT pulse bilaterally. Musculoskeletal: No joint deformities, No joint redness or warmth, no limitation of ROM in spin. Skin: No rashes.  Neuro: Alert, oriented X3, cranial nerves II-XII grossly intact, moves all extremities normally. Psych: Patient is not psychotic, no suicidal or hemocidal ideation.  Labs on Admission: I have personally reviewed following labs and imaging studies  CBC: Recent Labs  Lab 10/27/20 0739  WBC 7.5  HGB 15.8  HCT 48.0  MCV 85.7  PLT 283    Basic Metabolic Panel: Recent Labs  Lab 10/27/20 0739  NA 139  K 3.9  CL 106  CO2 23  GLUCOSE 282*  BUN 22*  CREATININE 1.22  CALCIUM 9.3   GFR: Estimated Creatinine Clearance: 92.2 mL/min (by C-G formula based on SCr of 1.22 mg/dL). Liver Function Tests: Recent Labs  Lab 10/27/20 0739  AST 23  ALT 22  ALKPHOS 57  BILITOT 1.3*  PROT 7.0  ALBUMIN 3.7   No results for input(s): LIPASE, AMYLASE in the last 168 hours. No results for input(s): AMMONIA in the last 168 hours. Coagulation Profile: Recent Labs  Lab 10/27/20 0739  INR 1.0   Cardiac Enzymes: No results for input(s): CKTOTAL, CKMB, CKMBINDEX, TROPONINI in the last 168 hours. BNP (last 3 results) No results for input(s): PROBNP in the last 8760 hours. HbA1C: No results for input(s): HGBA1C in the last 72 hours. CBG: No results for input(s): GLUCAP in the last 168 hours. Lipid Profile: No results for input(s): CHOL, HDL, LDLCALC, TRIG, CHOLHDL, LDLDIRECT in the last 72 hours. Thyroid Function Tests: No results for input(s): TSH, T4TOTAL, FREET4, T3FREE, THYROIDAB in the last 72 hours. Anemia Panel: No results for input(s): VITAMINB12, FOLATE, FERRITIN, TIBC, IRON, RETICCTPCT in the last 72 hours. Urine analysis:    Component Value Date/Time   COLORURINE YELLOW (A) 06/12/2020 1354   APPEARANCEUR HAZY (A) 06/12/2020 1354   APPEARANCEUR Clear 03/08/2020 0951   LABSPEC 1.013 06/12/2020 1354   LABSPEC 1.019 11/12/2012 1713   PHURINE 8.0 06/12/2020 1354   GLUCOSEU 150 (A) 06/12/2020 1354   GLUCOSEU Negative 11/12/2012 1713   HGBUR NEGATIVE 06/12/2020 1354   BILIRUBINUR NEGATIVE 06/12/2020 1354   BILIRUBINUR Negative 03/08/2020 0951   BILIRUBINUR Negative 11/12/2012 1713   KETONESUR NEGATIVE 06/12/2020 1354   PROTEINUR >=300 (A) 06/12/2020 1354   NITRITE NEGATIVE 06/12/2020 1354   LEUKOCYTESUR NEGATIVE 06/12/2020 1354   LEUKOCYTESUR Negative 11/12/2012 1713   Sepsis  Labs: $[id number]$ (procalcitonin:4,lacticidven:4) ) Recent Results (from the past 240 hour(s))  Resp Panel by RT-PCR (Flu A&B, Covid) Nasopharyngeal Swab     Status: None   Collection Time: 10/27/20  9:41 AM  Specimen: Nasopharyngeal Swab; Nasopharyngeal(NP) swabs in vial transport medium  Result Value Ref Range Status   SARS Coronavirus 2 by RT PCR NEGATIVE NEGATIVE Final    Comment: (NOTE) SARS-CoV-2 target nucleic acids are NOT DETECTED.  The SARS-CoV-2 RNA is generally detectable in upper respiratory specimens during the acute phase of infection. The lowest concentration of SARS-CoV-2 viral copies this assay can detect is 138 copies/mL. A negative result does not preclude SARS-Cov-2 infection and should not be used as the sole basis for treatment or other patient management decisions. A negative result may occur with  improper specimen collection/handling, submission of specimen other than nasopharyngeal swab, presence of viral mutation(s) within the areas targeted by this assay, and inadequate number of viral copies(<138 copies/mL). A negative result must be combined with clinical observations, patient history, and epidemiological information. The expected result is Negative.  Fact Sheet for Patients:  EntrepreneurPulse.com.au  Fact Sheet for Healthcare Providers:  IncredibleEmployment.be  This test is no t yet approved or cleared by the [place] FDA and  has been authorized for detection and/or diagnosis of SARS-CoV-2 by FDA under an Emergency Use Authorization (EUA). This EUA will remain  in effect (meaning this test can be used) for the duration of the COVID-19 declaration under Section 564(b)(1) of the Act, 21 U.S.C.section 360bbb-3(b)(1), unless the authorization is terminated  or revoked sooner.       Influenza A by PCR NEGATIVE NEGATIVE Final   Influenza B by PCR NEGATIVE NEGATIVE Final    Comment: (NOTE) The Xpert Xpress  SARS-CoV-2/FLU/RSV plus assay is intended as an aid in the diagnosis of influenza from Nasopharyngeal swab specimens and should not be used as a sole basis for treatment. Nasal washings and aspirates are unacceptable for Xpert Xpress SARS-CoV-2/FLU/RSV testing.  Fact Sheet for Patients: EntrepreneurPulse.com.au  Fact Sheet for Healthcare Providers: IncredibleEmployment.be  This test is not yet approved or cleared by the [place] FDA and has been authorized for detection and/or diagnosis of SARS-CoV-2 by FDA under an Emergency Use Authorization (EUA). This EUA will remain in effect (meaning this test can be used) for the duration of the COVID-19 declaration under Section 564(b)(1) of the Act, 21 U.S.C. section 360bbb-3(b)(1), unless the authorization is terminated or revoked.  Performed at [facility], [address] [place], [place] [address]      Radiological Exams on Admission: DG Chest [name] 1 View  Result Date: 10/27/2020 CLINICAL DATA:  Chest pain.  Shortness of breath. EXAM: PORTABLE CHEST 1 VIEW COMPARISON:  October 24, 21. FINDINGS: Similar interstitial prominence bilaterally. Similar scarring/atelectasis in the left midlung. No new consolidation. No visible pleural effusions or pneumothorax. Similar mild enlargement of the cardiac silhouette with pulmonary vascular congestion. No acute osseous abnormality. IMPRESSION: 1. Similar interstitial prominence bilaterally, which may represent mild interstitial edema versus the sequela of recurrent bouts of CHF. 2. Mild cardiomegaly and pulmonary vascular congestion. Electronically Signed   By: Margaretha Sheffield MD   On: 10/27/2020 08:23     EKG: I have personally reviewed.  Sinus rhythm, QTC 464, LAE, poor R wave progression, mild T wave inversion in precordial leads and inferior leads  Assessment/Plan Principal Problem:   Chest pain Active Problems:   Elevated troponin    Essential hypertension   Hyperlipidemia   Coronary artery disease, non-occlusive   Acute on chronic systolic CHF (congestive heart failure) (HCC)   CKD (chronic kidney disease), stage IIIa   Type II diabetes mellitus with renal manifestations (HCC)   COPD (  chronic obstructive pulmonary disease) (HCC)   PE (pulmonary thromboembolism) (HCC)   Chest pain, hx of CAD and  elevated troponin: Likely due to demand ischemia secondary to CHF exacerbation.  Troponin level 45, 45.  Cardiology, Dr. Saunders Revel is consulted. - admit to progressive unit as inpatient - Trend Trop - Repeat EKG in the am  - prn Nitroglycerin, Morphine, and aspirin, Crestor, Coreg - Risk factor stratification: will check FLP and A1C  - check UDS - 2d echo  Acute on chronic systolic CHF (congestive heart failure): 2D echo on 06/08/2020 showed EF 35-40%.  Patient has elevated BNP 510, vascular congestion on chest x-ray, clinically consistent with a CHF exacerbation -Lasix 40 mg bid by IV -Spironolactone -Entresto -2d echo -Daily weights -strict I/O's -Low salt diet -Fluid restriction -Obtain REDs Vest reading  Essential hypertension: -IV hydralazine as needed -Coreg, Entresto,   Hyperlipidemia -Crestor and TriCor  CKD (chronic kidney disease), stage IIIa: Stable -f/u by BMP  Type II diabetes mellitus with renal manifestations ([place]): Recent A1c is 9.8, poorly controlled.  Patient is taking Trulicity and glargine insulin -Sliding scale insulin -Decrease glargine insulin dose from 28 to 20 unit daily  COPD (chronic obstructive pulmonary disease) (HCC) -Bronchodilators  PE (pulmonary thromboembolism) ([place]) -Xarelto          DVT ppx: On Xarelto Code Status: Full code Family Communication: not done, no family member is at bed side. Disposition Plan:  Anticipate discharge back to previous environment Consults called: Dr. Saunders Revel of cardiology Admission status and Level of care: Progressive Cardiac: as inpt     Status is: Inpatient  Remains inpatient appropriate because:Inpatient level of care appropriate due to severity of illness   Dispo: The patient is from: Home              Anticipated d/c is to: Home              Patient currently is not medically stable to d/c.   Difficult to place patient No          Date of Service 10/27/2020    [facility]   If 7PM-7AM, please contact night-coverage [email or web address] 10/27/2020, 11:56 AM

## 2020-10-27 NOTE — ED Notes (Signed)
Pt given urinal for urine sample at this time.

## 2020-10-27 NOTE — Consult Note (Addendum)
Cardiology Consultation:   Patient ID: [name] MRN: [id number]; DOB: [birth year]  Admit date: 10/27/2020 Date of Consult: 10/27/2020  PCP:  Pollak, Adriana M, Drain  Cardiologist:  [name], MD  Advanced Practice Provider:  No care team member to display Electrophysiologist:  None    Patient Profile:   Chais Fehringer. is a 50 y.o. 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 Blaine Hamper.   History of Present Illness:   Mr. 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 He reports he quit smoking September of last year. He occasional drinks EtOH. He recently had a DVT/PE and continues on Xarelto / anticoagulation.   He was most recently admitted to [facility] 05/2020 for the evaluation of chest pain and elevated troponin. 10/26 R/LHC showed CTO of the proximal RCA with left-to-right collaterals to the distal RCA, which was new when compared to his 2018 cath.  He also had mild nonobstructive disease of the LAD and left circumflex. EF slightly improved 25 to 30%  35-40%. Plan was for medical management.  He was discharged on torsemide 60 mg twice daily with recommendation to continue to follow-up with the heart failure clinic in [place]. He was evaluated in June 2021 by Dr. Algernon Huxley but failed to follow-up since that time.  Last week, he noticed swelling in his face and felt as if he had more drainage than usual.  He attributed this to allergies/increased pollen in [place].  He  continued to experience drainage problems, though his face swelling improved shortly thereafter.  On Tuesday, he felt very fatigued and short of breath.  He also had onset of constant chest pain, rated 9/10. Chest pain was further described as sharp and occasionally pinpoint on the right side of his chest but mostly substernal across his entire chest.  At times, chest pain radiated into his right arm.  He also had radiation into his back. He denied any alleviating factors, including sublingual nitro.  He did report exertion made his pain slightly worse.  Chest pain felt similar to prior chest pain experienced before previous catheterizations.  He stated he decided to come to [facility] as he "thought he was having a heart attack."  He also notes pain as similar to his prior PE, though most consistent with before his caths. Associated symptoms included nausea without emesis, SOB as above, malaise, racing heart rate without palpitations.   He reports today that he has noticed difficulties with diuresis at home.  He reports home wt between 240-245 lbs, at time below 240lbs. In the past, he has noticed leg cramping if increasing his diuresis too much. He has been on a sliding scale diuresis regimen, which has improved his lower extremity edema.  However, he continues to notice abdominal swelling. He has chronic orthopnea.  He reports currently undergoing work-up for sleep apnea and has reportedly been told that he has severe apnea.  He is working to get a BiPAP machine.  He is compliant with his home medications, including anticoagulation for history of pulmonary embolism. He reports CP  at the time of consultation, rated 9/10.  In the ED, BP 147/90.  EKG showed NSR, 87 bpm, nonspecific changes in inferior leads, poor R wave progression in precordial leads, nonspecific changes in anterolateral precordial leadsHS Tn 45, 45, 44. Cr 1.22 and at baseline with BUN 22.  H&H stable. BNP 510.1. CXR with findings that may represent  mild interstitial edema.  Past Medical History:  Diagnosis Date  . Chest wall pain, chronic   . Chronic systolic CHF (congestive heart failure) ([place])   . Chronic Troponin Elevation   . CKD (chronic kidney disease), stage II-III   . COPD (chronic obstructive pulmonary disease) ([place])   . Diabetes mellitus without complication ([place])   . Hypertension    Resolved since weight loss  . Myocardial infarct ([place])   . NICM (nonischemic cardiomyopathy) ([place])    a. 03/2015 Echo: EF 45-50%; b. 12/2015 Echo: EF 20-25%; c. 02/2016 Echo: EF 30-35%; d. 11/2016 Echo: EF 40-45%; e. 06/2019 Echo: EF 30-35%; f. 11/2019 Echo: EF 25-30%, glob HK. Mild LVH. Gr2 DD. Mild-mod AI. BAE. Triv MR.  . Non-obstructive CAD (coronary artery disease)    a. 04/2015 low risk MV;  b. 12/2016 Cath: minor irregs in LAD/Diag/LCX/OM, RCA 40p/m/d.  [name] (nonsustained ventricular tachycardia) ([place])    a. 12/2015 noted on tele-->amio;  b. 12/2015 Event monitor: no VT. Atach noted.  . Obesity (BMI 30.0-34.9)   . Psoriasis   . Syncope    a. 01/2016 - felt to be vasovagal.  . Tobacco abuse     Past Surgical History:  Procedure Laterality Date  . AMPUTATION    . CARDIAC CATHETERIZATION    . FINGER AMPUTATION     Traumatic  . FINGER FRACTURE SURGERY Left   . LEFT HEART CATH AND CORONARY ANGIOGRAPHY N/A 01/06/2017   Procedure: Left Heart Cath and Coronary Angiography;  Surgeon: Wellington Hampshire, MD;  Location: [facility];  Service: Cardiovascular;  Laterality: N/A;  . RIGHT/LEFT HEART CATH AND CORONARY ANGIOGRAPHY N/A 06/13/2020   Procedure: RIGHT/LEFT HEART CATH AND CORONARY ANGIOGRAPHY;  Surgeon: Nelva Bush, MD;  Location: [place] [facility] [facility];  Service: Cardiovascular;  Laterality: N/A;     Home Medications:  Prior to Admission medications   Medication Sig Start Date End Date Taking? Authorizing Provider  carvedilol (COREG) 6.25 MG tablet Take 1 tablet (6.25 mg total) by mouth 2 (two) times daily with a meal.  08/02/20  Yes Larey Dresser, MD  fenofibrate (TRICOR) 145 MG tablet Take 1 tablet (145 mg total) by mouth daily. 06/09/20  Yes Lorella Nimrod, MD  Insulin Glargine (BASAGLAR KWIKPEN) 100 UNIT/ML Inject 28 Units into the skin at bedtime. 06/09/20  Yes Lorella Nimrod, MD  rivaroxaban (XARELTO) 20 MG TABS tablet Take 1 tablet (20 mg total) by mouth daily with supper. 06/28/20  Yes Cammie Sickle, MD  rosuvastatin (CRESTOR) 20 MG tablet Take 1 tablet (20 mg total) by mouth at bedtime. 06/09/20  Yes Lorella Nimrod, MD  sacubitril-valsartan (ENTRESTO) 49-51 MG Take 1 tablet by mouth 2 (two) times daily. 08/02/20  Yes Larey Dresser, MD  spironolactone (ALDACTONE) 25 MG tablet Take 1 tablet (25 mg total) by mouth 2 (two) times daily. 08/02/20  Yes Larey Dresser, MD  torsemide (DEMADEX) 20 MG tablet Take 3 tablets (60 mg total) by mouth daily. 07/28/20  Yes Larey Dresser, MD  TRULICITY 0.63 KZ/6.0FU SOPN INJECT 0.75 MG INTO THE SKIN ONCE A WEEK. 09/28/20  Yes Carles Collet  M, PA-C  B-D UF III MINI PEN NEEDLES 31G X 5 MM MISC 1 DOSE BY DOES NOT APPLY ROUTE 4 (FOUR) TIMES DAILY - WITH MEALS AND AT BEDTIME. 06/12/20   Lorella Nimrod, MD  blood glucose meter kit and supplies KIT Dispense based on patient and insurance preference. Use up to four times daily as directed. (FOR ICD-9 250.00, 250.01). 06/09/20   Lorella Nimrod, MD  cloNIDine (CATAPRES) 0.1 MG tablet Take 0.1 mg by mouth at bedtime. Patient not taking: No sig reported 08/06/20   [provider]  COMBIVENT RESPIMAT 20-100 MCG/ACT AERS respimat Inhale 1 puff into the lungs 4 (four) times daily. 10/26/20   [provider]  nitroGLYCERIN (NITROSTAT) 0.4 MG SL tablet Place 1 tablet (0.4 mg total) under the tongue every 5 (five) minutes x 3 doses as needed for chest pain. 06/09/20   Lorella Nimrod, MD    Inpatient Medications: Scheduled Meds: . aspirin EC  81 mg Oral Daily  . carvedilol  6.25 mg Oral BID WC  . fenofibrate   160 mg Oral Daily  . insulin aspart  0-5 Units Subcutaneous QHS  . insulin aspart  0-9 Units Subcutaneous TID WC  . insulin glargine  20 Units Subcutaneous Q2200  . Ipratropium-Albuterol  1 puff Inhalation QID  . rivaroxaban  20 mg Oral Q supper  . rosuvastatin  20 mg Oral QHS  . sacubitril-valsartan  1 tablet Oral BID  . sodium chloride flush  3 mL Intravenous Q12H  . spironolactone  25 mg Oral BID   Continuous Infusions: . sodium chloride     PRN Meds: sodium chloride, acetaminophen, albuterol, dextromethorphan-guaiFENesin, hydrALAZINE, morphine injection, nitroGLYCERIN, ondansetron (ZOFRAN) IV, sodium chloride flush  Allergies:    Allergies  Allergen Reactions  . Metformin And Related Nausea And Vomiting  . Prednisone Other (See Comments)    Reaction: Hallucinations    . Zantac [Ranitidine Hcl] Diarrhea and Nausea Only    Night sweats    Social History:   Social History   Socioeconomic History  . Marital status: Widowed    Spouse name: Not on file  . Number of children: 1  . Years of education: 48  . Highest education level: 12th grade  Occupational History  . Occupation: disabled  Tobacco Use  . Smoking status: Former Smoker    Packs/day: 0.50    Years: 33.00    Pack years: 16.50    Types: Cigarettes    Quit date: 05/08/2020    Years since quitting: 0.4  . Smokeless tobacco: Former Network engineer  . Vaping Use: Former  Substance and Sexual Activity  . Alcohol use: Yes    Alcohol/week: 0.0 standard drinks    Comment: occassionally  . Drug use: No  . Sexual activity: Not Currently  Other Topics Concern  . Not on file  Social History Narrative   Lives at home with wife and his dad's wife.        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    Social Determinants of Health   Financial Resource Strain: Not on file  Food Insecurity: Not on file  Transportation Needs: Not on file  Physical  Activity: Not on file  Stress: Not on file  Social Connections: Not on file  Intimate Partner Violence: Not on file    Family History:    Family History  Problem Relation Age of Onset  . Diabetes Mellitus II Mother   .  Hypothyroidism Mother   . Hypertension Mother   . Hypertension Father   . Gout Father   . Cancer Paternal Aunt   . Cancer Maternal Grandfather   . Diabetes Maternal Grandfather      ROS:  Please see the history of present illness.  Review of Systems  Constitutional: Positive for malaise/fatigue.  Respiratory: Positive for shortness of breath.   Cardiovascular: Positive for chest pain and orthopnea. Negative for palpitations and leg swelling.  Gastrointestinal: Positive for nausea. Negative for vomiting.  Musculoskeletal: Negative for falls.  Neurological: Positive for weakness. Negative for dizziness.  All other systems reviewed and are negative.   All other ROS reviewed and negative.     Physical Exam/Data:   Vitals:   10/27/20 0808 10/27/20 0830 10/27/20 0900 10/27/20 1230  BP: (!) 147/85 (!) 151/85 (!) 159/91 (!) 164/99  Pulse: 82 81 79 86  Resp: (!) 21 (!) 22 (!) 24 (!) 23  Temp:      TempSrc:      SpO2: 94% 96% 91% 100%  Weight:      Height:       No intake or output data in the 24 hours ending 10/27/20 1422 Last 3 Weights 10/27/2020 10/26/2020 08/15/2020  Weight (lbs) 245 lb 245 lb 245 lb  Weight (kg) 111.131 kg 111.131 kg 111.131 kg     Body mass index is 33.23 kg/m.  General:  Obese male, NAD HEENT: normal Lymph: no adenopathy Neck: JVP difficult to assess due to body habitus but appears elevated at ~10cm  Vascular: radials 2+ bilaterally  Cardiac:  normal S1, S2; RRR; no murmur Lungs:  clear to auscultation bilaterally, no wheezing, rhonchi or rales  Abd: firm, distended Ext: no edema Musculoskeletal:  No deformities Skin: warm and dry, skin / derm rashes notes of LE Neuro:  no focal abnormalities noted Psych:  Normal affect    EKG:  The EKG was personally reviewed and demonstrates:  NSR, 87 bpm, nonspecific changes and TWI in inferior leads, poor R wave progression in precordial leads, nonspecific changes in anterolateral precordial leads Telemetry:  Telemetry was personally reviewed and demonstrates:  NSR, PACs  Relevant CV Studies: Echo 05/2020 1. Left ventricular ejection fraction, by estimation, is 35 to 40%. The  left ventricle has moderately decreased function. The left ventricle  demonstrates global hypokinesis. The left ventricular internal cavity size  was moderately dilated. Left  ventricular diastolic parameters are indeterminate.  2. Right ventricular systolic function is normal. The right ventricular  size is normal. There is mildly elevated pulmonary artery systolic  pressure. The estimated right ventricular systolic pressure is 84.1 mmHg.  3. Left atrial size was moderately dilated.  4. Mild to moderate mitral valve regurgitation.   [facility] 05/2020 Conclusions: 1. Severe single-vessel coronary artery disease with chronic total occlusion of the proximal RCA.  The distal vessel fills via left to right collaterals. 2. Mild to moderate, nonobstructive CAD involving the LAD and LCx with up to 50% stenosis. 3. Mildly elevated left and right heart filling pressures. 4. Moderate pulmonary hypertension (mean PAP 40 mmHg) with significantly elevated pulmonary vascular resistance (PVR 7.4 WU). 5. Severely reduced Fick cardiac output/index. Recommendations: 1. Continue IV diuresis for at least 1 more day. 2. Given severely reduced cardiac output, I will decrease carvedilol to 6.5 mg twice daily.  If blood pressure tolerates and renal function is stable, consider further escalation of Entresto. 3. Restart IV heparin 2 hours after TR band removal.  If no evidence of  bleeding or vascular complication, the patient can be transitioned back to [facility] tomorrow. 4. Strongly encourage patient to follow-up with  the advanced heart failure clinic given his significant mixed ischemic and nonischemic cardiomyopathy and severe pulmonary hypertension.  He was evaluated by Dr. Aundra Dubin in June but failed to follow-up since then. 5. Aggressive secondary prevention of coronary artery disease.  Favor medical management of CTO of RCA, which is new since 2018.  Nelva Bush, MD [facility]   Laboratory Data:  High Sensitivity Troponin:   Recent Labs  Lab 10/27/20 0739 10/27/20 0941 10/27/20 1218  TROPONINIHS 45* 45* 44*     Chemistry Recent Labs  Lab 10/27/20 0739  NA 139  K 3.9  CL 106  CO2 23  GLUCOSE 282*  BUN 22*  CREATININE 1.22  CALCIUM 9.3  GFRNONAA >60  ANIONGAP 10    Recent Labs  Lab 10/27/20 0739  PROT 7.0  ALBUMIN 3.7  AST 23  ALT 22  ALKPHOS 57  BILITOT 1.3*   Hematology Recent Labs  Lab 10/27/20 0739  WBC 7.5  RBC 5.60  HGB 15.8  HCT 48.0  MCV 85.7  MCH 28.2  MCHC 32.9  RDW 13.7  PLT 209   BNP Recent Labs  Lab 10/27/20 0941  BNP 510.1*    DDimer No results for input(s): DDIMER in the last 168 hours.   Radiology/Studies:  DG Chest Port 1 View  Result Date: 10/27/2020 CLINICAL DATA:  Chest pain.  Shortness of breath. EXAM: PORTABLE CHEST 1 VIEW COMPARISON:  October 24, 21. FINDINGS: Similar interstitial prominence bilaterally. Similar scarring/atelectasis in the left midlung. No new consolidation. No visible pleural effusions or pneumothorax. Similar mild enlargement of the cardiac silhouette with pulmonary vascular congestion. No acute osseous abnormality. IMPRESSION: 1. Similar interstitial prominence bilaterally, which may represent mild interstitial edema versus the sequela of recurrent bouts of CHF. 2. Mild cardiomegaly and pulmonary vascular congestion. Electronically Signed   By: Margaretha Sheffield MD   On: 10/27/2020 08:23     Assessment and Plan:   Chest pain with history of CAD Elevated high-sensitivity troponin, suspect supply demand  ischemia --CP current, constant, and described as similar to before his previous catheterizations. Chest pain has somewhat atypical features, including that it is constant. High-sensitivity troponin minimally elevated, flat trending.  EKG with nonspecific changes/TWI as above. Suspect HS Tn elevation due to supply demand ischemia with known CKD and likely volume overload. In addition, suspect CP is likely multifactorial with history of lung dz (COPD/OSA/PE), HFrEF/PHTN, and known CAD. 05/2020 R/LHC with CTO of the pRCA (L-R collaterals), mild to moderate nonobstructive CAD (LAD, LCx up to 50%s), mildly elevated L/R  filling pressures, moderate PHTN, reduced CO. RF include tobacco use (quit last year), known CAD, HTN, HLD, COPD, male, and age.   Echo ordered, pending. Previous echo above.  Will reassess EF, WM, heart pressures, and r/o any acute structural changes.   Continue current medications, including Xarelto in lieu of ASA, Coreg, statin, PRN SL nitro.   Will discontinue ASA to reduce risk of bleeding.  Could consider Imdur for ongoing CP.  Ongoing management of acute on chronic systolic heart failure as directly below.  AOC HFrEF, mixed ICM/NICM  --Reports shortness of breath that started last week and has persisted with chest pain starting Tuesday as above. Previous EF 05/2020 35-40%.  Compliance with his medications reported, but does follow a sliding scale diuresis regimen with total torsemide 39m daily. He usually takes 27mBID with an  additional 85m as needed per pt report. Suspect volume overload on exam and with BNP above.  Recommend increased dose IV diuresis. Given his home dose of torsemide, he will likely benefit from IV Lasix 40 BID, if not IV lasix 80 mg twice daily as renal function allows.  Monitor I/Os  Daily Wts.   05/2020 discharge wt of 112.2kg.    Wt today 111.1 kg.  Daily BMET.   Baseline Cr 1.4-1.5.   3/11 Cr 1.22.  Continue current Coreg, Entresto,  spironolactone.  Will need to [facility] with cardiac mems, given multiple heart failure admissions over 2021-2022.  This was discussed with patient today.  HTN --BP suboptimal, likely exacerbated by volume overload.    Continue current medications and titrate as needed for optimize BP.  HLD --05/2020 LDL 75 and still above goal.    Increase dose of statin to Crestor 40 mg daily.  History of Subsegmental pulmonary embolism of the right lower lobe --Compliance with his anticoagulation/Xarelto.  Continue.  DM2 --SSI, per IM.     TIMI Risk Score for Unstable Angina or Non-ST Elevation MI:   The patient's TIMI risk score is 5, which indicates a 26% risk of all cause mortality, new or recurrent myocardial infarction or need for urgent revascularization in the next 14 days.   New York Heart Association (NYHA) Functional Class NYHA Class II        For questions or updates, please contact [facility] Please consult [email or web address] for contact info under    Signed, JArvil Chaco PA-C  10/27/2020 2:22 PM

## 2020-10-27 NOTE — ED Notes (Signed)
Admitting MD at bedside.

## 2020-10-27 NOTE — Plan of Care (Signed)
  Problem: Clinical Measurements: Goal: Cardiovascular complication will be avoided Outcome: Progressing   Problem: Activity: Goal: Risk for activity intolerance will decrease Outcome: Progressing   Problem: Nutrition: Goal: Adequate nutrition will be maintained Outcome: Progressing   

## 2020-10-27 NOTE — ED Triage Notes (Signed)
Patient to ER for c/o CP and shortness of breath x3 days. Patient able to speak in sentences, but visibly short of breath. Patient has h/o MI a few years ago, states feels similar.

## 2020-10-28 DIAGNOSIS — I2699 Other pulmonary embolism without acute cor pulmonale: Secondary | ICD-10-CM

## 2020-10-28 DIAGNOSIS — R079 Chest pain, unspecified: Secondary | ICD-10-CM | POA: Diagnosis not present

## 2020-10-28 DIAGNOSIS — I2510 Atherosclerotic heart disease of native coronary artery without angina pectoris: Secondary | ICD-10-CM

## 2020-10-28 DIAGNOSIS — I5023 Acute on chronic systolic (congestive) heart failure: Secondary | ICD-10-CM

## 2020-10-28 DIAGNOSIS — N1831 Chronic kidney disease, stage 3a: Secondary | ICD-10-CM | POA: Diagnosis not present

## 2020-10-28 DIAGNOSIS — R778 Other specified abnormalities of plasma proteins: Secondary | ICD-10-CM | POA: Diagnosis not present

## 2020-10-28 DIAGNOSIS — E785 Hyperlipidemia, unspecified: Secondary | ICD-10-CM | POA: Diagnosis not present

## 2020-10-28 DIAGNOSIS — J449 Chronic obstructive pulmonary disease, unspecified: Secondary | ICD-10-CM

## 2020-10-28 DIAGNOSIS — I25118 Atherosclerotic heart disease of native coronary artery with other forms of angina pectoris: Secondary | ICD-10-CM

## 2020-10-28 DIAGNOSIS — I501 Left ventricular failure: Secondary | ICD-10-CM

## 2020-10-28 DIAGNOSIS — E1122 Type 2 diabetes mellitus with diabetic chronic kidney disease: Secondary | ICD-10-CM

## 2020-10-28 LAB — GLUCOSE, CAPILLARY: Glucose-Capillary: 205 mg/dL — ABNORMAL HIGH (ref 70–99)

## 2020-10-28 LAB — MAGNESIUM: Magnesium: 1.9 mg/dL (ref 1.7–2.4)

## 2020-10-28 LAB — CBC
HCT: 49.3 % (ref 39.0–52.0)
Hemoglobin: 16.1 g/dL (ref 13.0–17.0)
MCH: 28.1 pg (ref 26.0–34.0)
MCHC: 32.7 g/dL (ref 30.0–36.0)
MCV: 86.2 fL (ref 80.0–100.0)
Platelets: 206 10*3/uL (ref 150–400)
RBC: 5.72 MIL/uL (ref 4.22–5.81)
RDW: 13.6 % (ref 11.5–15.5)
WBC: 8.4 10*3/uL (ref 4.0–10.5)
nRBC: 0 % (ref 0.0–0.2)

## 2020-10-28 LAB — BASIC METABOLIC PANEL
Anion gap: 9 (ref 5–15)
BUN: 27 mg/dL — ABNORMAL HIGH (ref 6–20)
CO2: 28 mmol/L (ref 22–32)
Calcium: 9.9 mg/dL (ref 8.9–10.3)
Chloride: 102 mmol/L (ref 98–111)
Creatinine, Ser: 1.28 mg/dL — ABNORMAL HIGH (ref 0.61–1.24)
GFR, Estimated: >60 mL/min (ref >60)
Glucose, Bld: 210 mg/dL — ABNORMAL HIGH (ref 70–99)
Potassium: 3.8 mmol/L (ref 3.5–5.1)
Sodium: 139 mmol/L (ref 135–145)

## 2020-10-28 MED ORDER — FUROSEMIDE 10 MG/ML IJ SOLN
40.0000 mg | Freq: Once | INTRAMUSCULAR | Status: AC
  Administered 2020-10-28: 40 mg via INTRAVENOUS
  Filled 2020-10-28: qty 4

## 2020-10-28 MED ORDER — TORSEMIDE 20 MG PO TABS: 60.0000 mg | ORAL_TABLET | Freq: Every day | ORAL | 11 refills | Status: DC

## 2020-10-28 MED ORDER — METOLAZONE 2.5 MG PO TABS: 2.5000 mg | ORAL_TABLET | Freq: Every day | ORAL | 0 refills | Status: DC | PRN

## 2020-10-28 MED ORDER — TORSEMIDE 20 MG PO TABS: 60.0000 mg | ORAL_TABLET | Freq: Every day | ORAL | Status: DC

## 2020-10-28 MED ORDER — TORSEMIDE 60 MG PO TABS: 60.0000 mg | ORAL_TABLET | Freq: Every day | ORAL | 0 refills | Status: DC | PRN

## 2020-10-28 MED ORDER — METOLAZONE 2.5 MG PO TABS
2.5000 mg | ORAL_TABLET | Freq: Every day | ORAL | Status: DC | PRN
  Filled 2020-10-28: qty 1

## 2020-10-28 NOTE — Discharge Summary (Signed)
[facility] [place] NAME: Gabriel [name]    MR#:  [id number]  DATE OF BIRTH:  1970/09/03  DATE OF ADMISSION:  10/27/2020 ADMITTING PHYSICIAN: [name], MD  DATE OF DISCHARGE: 10/28/2020  PRIMARY CARE PHYSICIAN: Trinna Post, PA-C    ADMISSION DIAGNOSIS:  Chest pain [R07.9] Acute on chronic systolic CHF (congestive heart failure) (HCC) [I50.23] Acute on chronic congestive heart failure, unspecified heart failure type ([place]) [I50.9]  DISCHARGE DIAGNOSIS:  Acute on Chronic CHF, systolic Seasonal allergies  SECONDARY DIAGNOSIS:   Past Medical History:  Diagnosis Date  . Chest wall pain, chronic   . Chronic systolic CHF (congestive heart failure) ([place])   . Chronic Troponin Elevation   . CKD (chronic kidney disease), stage II-III   . COPD (chronic obstructive pulmonary disease) ([place])   . Diabetes mellitus without complication ([place])   . Hypertension    Resolved since weight loss  . Myocardial infarct ([place])   . NICM (nonischemic cardiomyopathy) ([place])    a. 03/2015 Echo: EF 45-50%; b. 12/2015 Echo: EF 20-25%; c. 02/2016 Echo: EF 30-35%; d. 11/2016 Echo: EF 40-45%; e. 06/2019 Echo: EF 30-35%; f. 11/2019 Echo: EF 25-30%, glob HK. Mild LVH. Gr2 DD. Mild-mod AI. BAE. Triv MR.  . Non-obstructive CAD (coronary artery disease)    a. 04/2015 low risk MV;  b. 12/2016 Cath: minor irregs in LAD/Diag/LCX/OM, RCA 40p/m/d.  [name] (nonsustained ventricular tachycardia) ([place])    a. 12/2015 noted on tele-->amio;  b. 12/2015 Event monitor: no VT. Atach noted.  . Obesity (BMI 30.0-34.9)   . Psoriasis   . Syncope    a. 01/2016 - felt to be vasovagal.  . Tobacco abuse     HOSPITAL COURSE:  Gabriel [name]. is a 50 y.o. male with medical history significant of CAD, HLD, HTN, DM, COPD, PE on Xarelto, sCHF, CKD-III, NSVT, former smoker, presents with CP and SOB. Patient states that he has shortness of breath in the past 3 days, which has been progressively  worsening.  Acute on chronic systolic CHF (congestive heart failure): -- 2D echo on 06/08/2020 showed EF 35-40%.  Patient has elevated BNP 510, vascular congestion on chest x-ray, clinically consistent with a CHF exacerbation -Lasix 40 mg bid by IV--diuresed >3 liters UOP. Feels back to baseline --cont po Torsemide 60 mg qd. Pt advised to take Metolazone 2.5 mg prn with increasing sob, leg edema and weight gain--per Dr Rockey Situ. Rx given - Cont Spironolactone,Entresto, Coreg -Low salt diet -Fluid restriction -sats 97% on RA  Chest pain, hx of CAD and  elevated troponin: Likely due to demand ischemia secondary to CHF exacerbation.  Troponin level 45, 45. --  Cardiology input appreciated. Pt denies CP today - admit to progressive unit as inpatient - prn Nitroglycerin, aspirin, Crestor, Coreg  Essential hypertension: -IV hydralazine as needed -Coreg, Entresto  Hyperlipidemia -Crestor and TriCor  CKD (chronic kidney disease), stage IIIa: Stable  Type II diabetes mellitus with renal manifestations ([place]): Recent A1c is 9.8, poorly controlled.  Patient is taking Trulicity and glargine insulin -Sliding scale insulin -Resume home regimen at discharge  COPD (chronic obstructive pulmonary disease) (HCC) -Bronchodilators  PE (pulmonary thromboembolism) (HCC) -Xarelto   Overall hemodynamically stable. Pt will d/ home today. [place] with Dr Rockey Situ He prefers f/u CHF clinic in [place] instead of [facility] (due to driving distance)--referral sent   DVT ppx: On Xarelto Code Status: Full code Family Communication:none today Disposition Plan:  Anticipate discharge back to  previous environment Consults called: Dr. Emelia Loron of cardiology Admission status and Level of care: Progressive Cardiac    Status is: Inpatient  Dispo: The patient is from: Home  Anticipated d/c is to: Home  Patient currently is medically stable to d/c.              Difficult to place  patient No   CONSULTS OBTAINED:  Treatment Team:  Nelva Bush, MD  DRUG ALLERGIES:   Allergies  Allergen Reactions  . Metformin And Related Nausea And Vomiting  . Prednisone Other (See Comments)    Reaction: Hallucinations    . Zantac [Ranitidine Hcl] Diarrhea and Nausea Only    Night sweats    DISCHARGE MEDICATIONS:   Allergies as of 10/28/2020      Reactions   Metformin And Related Nausea And Vomiting   Prednisone Other (See Comments)   Reaction: Hallucinations    Zantac [ranitidine Hcl] Diarrhea, Nausea Only   Night sweats      Medication List    STOP taking these medications   cloNIDine 0.1 MG tablet Commonly known as: CATAPRES     TAKE these medications   B-D UF III MINI PEN NEEDLES 31G X 5 MM Misc Generic drug: Insulin Pen Needle 1 DOSE BY DOES NOT APPLY ROUTE 4 (FOUR) TIMES DAILY - WITH MEALS AND AT BEDTIME.   Basaglar KwikPen 100 UNIT/ML Inject 28 Units into the skin at bedtime.   blood glucose meter kit and supplies Kit Dispense based on patient and insurance preference. Use up to four times daily as directed. (FOR ICD-9 250.00, 250.01).   carvedilol 6.25 MG tablet Commonly known as: COREG Take 1 tablet (6.25 mg total) by mouth 2 (two) times daily with a meal.   Combivent Respimat 20-100 MCG/ACT Aers respimat Generic drug: Ipratropium-Albuterol Inhale 1 puff into the lungs 4 (four) times daily.   fenofibrate 145 MG tablet Commonly known as: Tricor Take 1 tablet (145 mg total) by mouth daily.   metolazone 2.5 MG tablet Commonly known as: ZAROXOLYN Take 1 tablet (2.5 mg total) by mouth daily as needed (sob,leg edema). Start taking on: October 29, 2020   nitroGLYCERIN 0.4 MG SL tablet Commonly known as: NITROSTAT Place 1 tablet (0.4 mg total) under the tongue every 5 (five) minutes x 3 doses as needed for chest pain.   rivaroxaban 20 MG Tabs tablet Commonly known as: XARELTO Take 1 tablet (20 mg total) by mouth daily with supper.    rosuvastatin 20 MG tablet Commonly known as: CRESTOR Take 1 tablet (20 mg total) by mouth at bedtime.   sacubitril-valsartan 49-51 MG Commonly known as: ENTRESTO Take 1 tablet by mouth 2 (two) times daily.   spironolactone 25 MG tablet Commonly known as: ALDACTONE Take 1 tablet (25 mg total) by mouth 2 (two) times daily.   torsemide 20 MG tablet Commonly known as: DEMADEX Take 3 tablets (60 mg total) by mouth daily. What changed: Another medication with the same name was added. Make sure you understand how and when to take each.   Torsemide 60 MG Tabs Take 60 mg by mouth daily as needed. For increasing SOB and weight again (this is extra from your daily 60 mg dosing) What changed: You were already taking a medication with the same name, and this prescription was added. Make sure you understand how and when to take each.   Trulicity 4.07 WK/0.8UP Sopn Generic drug: Dulaglutide INJECT 0.75 MG INTO THE SKIN ONCE A WEEK.  If you experience worsening of your admission symptoms, develop shortness of breath, life threatening emergency, suicidal or homicidal thoughts you must seek medical attention immediately by calling 911 or calling your MD immediately  if symptoms less severe.  You Must read complete instructions/literature along with all the possible adverse reactions/side effects for all the Medicines you take and that have been prescribed to you. Take any new Medicines after you have completely understood and accept all the possible adverse reactions/side effects.   Please note  You were cared for by a hospitalist during your hospital stay. If you have any questions about your discharge medications or the care you received while you were in the hospital after you are discharged, you can call the unit and asked to speak with the hospitalist on call if the hospitalist that took care of you is not available. Once you are discharged, your primary care physician will handle any  further medical issues. Please note that NO REFILLS for any discharge medications will be authorized once you are discharged, as it is imperative that you return to your primary care physician (or establish a relationship with a primary care physician if you do not have one) for your aftercare needs so that they can reassess your need for medications and monitor your lab values. Today   SUBJECTIVE   Doing well  VITAL SIGNS:  Blood pressure 133/80, pulse 73, temperature 98.6 F (37 C), temperature source Oral, resp. rate 20, height 6' (1.829 m), weight 109.8 kg, SpO2 97 %.  I/O:    Intake/Output Summary (Last 24 hours) at 10/28/2020 1010 Last data filed at 10/28/2020 0800 Gross per 24 hour  Intake --  Output 3300 ml  Net -3300 ml    PHYSICAL EXAMINATION:  GENERAL:  50 y.o.-year-old patient lying in the bed with no acute distress.  LUNGS: Normal breath sounds bilaterally, no wheezing, rales,rhonchi or crepitation. No use of accessory muscles of respiration.  CARDIOVASCULAR: S1, S2 normal. No murmurs, rubs, or gallops.  ABDOMEN: Soft, non-tender, non-distended. Bowel sounds present. No organomegaly or mass.  EXTREMITIES: No pedal edema, cyanosis, or clubbing.  NEUROLOGIC: Cranial nerves II through XII are intact. Muscle strength 5/5 in all extremities. Sensation intact. Gait not checked.  PSYCHIATRIC: The patient is alert and oriented x 3.  SKIN: No obvious rash, lesion, or ulcer.   DATA REVIEW:   CBC  Recent Labs  Lab 10/28/20 0553  WBC 8.4  HGB 16.1  HCT 49.3  PLT 206    Chemistries  Recent Labs  Lab 10/27/20 0739 10/28/20 0553  NA 139 139  K 3.9 3.8  CL 106 102  CO2 23 28  GLUCOSE 282* 210*  BUN 22* 27*  CREATININE 1.22 1.28*  CALCIUM 9.3 9.9  MG  --  1.9  AST 23  --   ALT 22  --   ALKPHOS 57  --   BILITOT 1.3*  --     Microbiology Results   Recent Results (from the past 240 hour(s))  Resp Panel by RT-PCR (Flu A&B, Covid) Nasopharyngeal Swab      Status: None   Collection Time: 10/27/20  9:41 AM   Specimen: Nasopharyngeal Swab; Nasopharyngeal(NP) swabs in vial transport medium  Result Value Ref Range Status   SARS Coronavirus 2 by RT PCR NEGATIVE NEGATIVE Final    Comment: (NOTE) SARS-CoV-2 target nucleic acids are NOT DETECTED.  The SARS-CoV-2 RNA is generally detectable in upper respiratory specimens during the acute phase of infection. The lowest concentration  of SARS-CoV-2 viral copies this assay can detect is 138 copies/mL. A negative result does not preclude SARS-Cov-2 infection and should not be used as the sole basis for treatment or other patient management decisions. A negative result may occur with  improper specimen collection/handling, submission of specimen other than nasopharyngeal swab, presence of viral mutation(s) within the areas targeted by this assay, and inadequate number of viral copies(<138 copies/mL). A negative result must be combined with clinical observations, patient history, and epidemiological information. The expected result is Negative.  Fact Sheet for Patients:  EntrepreneurPulse.com.au  Fact Sheet for Healthcare Providers:  IncredibleEmployment.be  This test is no t yet approved or cleared by the [place] FDA and  has been authorized for detection and/or diagnosis of SARS-CoV-2 by FDA under an Emergency Use Authorization (EUA). This EUA will remain  in effect (meaning this test can be used) for the duration of the COVID-19 declaration under Section 564(b)(1) of the Act, 21 U.S.C.section 360bbb-3(b)(1), unless the authorization is terminated  or revoked sooner.       Influenza A by PCR NEGATIVE NEGATIVE Final   Influenza B by PCR NEGATIVE NEGATIVE Final    Comment: (NOTE) The Xpert Xpress SARS-CoV-2/FLU/RSV plus assay is intended as an aid in the diagnosis of influenza from Nasopharyngeal swab specimens and should not be used as a sole basis  for treatment. Nasal washings and aspirates are unacceptable for Xpert Xpress SARS-CoV-2/FLU/RSV testing.  Fact Sheet for Patients: EntrepreneurPulse.com.au  Fact Sheet for Healthcare Providers: IncredibleEmployment.be  This test is not yet approved or cleared by the [place] FDA and has been authorized for detection and/or diagnosis of SARS-CoV-2 by FDA under an Emergency Use Authorization (EUA). This EUA will remain in effect (meaning this test can be used) for the duration of the COVID-19 declaration under Section 564(b)(1) of the Act, 21 U.S.C. section 360bbb-3(b)(1), unless the authorization is terminated or revoked.  Performed at [facility], [address] [place], [place] [address]     RADIOLOGY:  [facility] Chest Port 1 View  Result Date: 10/27/2020 CLINICAL DATA:  Chest pain.  Shortness of breath. EXAM: PORTABLE CHEST 1 VIEW COMPARISON:  October 24, 21. FINDINGS: Similar interstitial prominence bilaterally. Similar scarring/atelectasis in the left midlung. No new consolidation. No visible pleural effusions or pneumothorax. Similar mild enlargement of the cardiac silhouette with pulmonary vascular congestion. No acute osseous abnormality. IMPRESSION: 1. Similar interstitial prominence bilaterally, which may represent mild interstitial edema versus the sequela of recurrent bouts of CHF. 2. Mild cardiomegaly and pulmonary vascular congestion. Electronically Signed   By: Margaretha Sheffield MD   On: 10/27/2020 08:23     CODE STATUS:     Code Status Orders  (From admission, onward)         Start     Ordered   10/27/20 1105  Full code  Continuous        10/27/20 1104        Code Status History    Date Active Date Inactive Code Status Order ID Comments User Context   06/11/2020 1250 06/14/2020 2009 Full Code [id number]  [name], MD ED   06/07/2020 1929 06/09/2020 2105 Full Code [id number]  Athena Masse, MD ED   05/12/2020  1648 05/14/2020 1806 Full Code [id number]  Sharen Hones, MD ED   12/31/2019 1710 01/02/2020 1621 Full Code [id number]  Loletha Grayer, MD ED   12/20/2019 1217 12/23/2019 2003 Full Code [id number]  Collier Bullock, MD ED   11/22/2019 [phone] 11/24/2019  1834 Full Code [id number]  Athena Masse, MD ED   09/22/2019 1026 09/23/2019 1703 Full Code [id number]  Vashti Hey, MD Inpatient   06/26/2019 0038 06/27/2019 1729 Full Code [id number]  Mansy, Arvella Merles, MD ED   10/01/2017 0027 10/01/2017 1506 Full Code [id number]  Lance Coon, MD Inpatient   08/21/2017 1350 08/24/2017 1615 Full Code [id number]  Gladstone Lighter, MD Inpatient   04/28/2017 1653 04/29/2017 1755 Full Code [id number]  Gladstone Lighter, MD Inpatient   01/05/2017 1555 01/07/2017 1643 Full Code [id number]  Nicholes Mango, MD ED   11/28/2016 1342 11/29/2016 2259 Full Code [id number]  Demetrios Loll, MD Inpatient   10/28/2016 1358 10/30/2016 1354 Full Code [id number]  Hillary Bow, MD ED   03/15/2016 0758 03/16/2016 1508 Full Code [id number]  Harrie Foreman, MD Inpatient   02/16/2016 0224 02/17/2016 1302 Full Code [id number]  Harrie Foreman, MD Inpatient   01/22/2016 2351 01/23/2016 1641 Full Code [id number]  Demetrios Loll, MD ED   01/03/2016 1422 01/09/2016 1454 Full Code [id number]  Lavetta Nielsen, Aaron Mose, MD ED   05/17/2015 2341 05/19/2015 1731 Full Code [id number]  Lance Coon, MD Inpatient   03/20/2015 0826 03/21/2015 1527 Full Code [id number]  Harrie Foreman, MD Inpatient   Advance Care Planning Activity       TOTAL TIME TAKING CARE OF THIS PATIENT: *40* minutes.    Fritzi Mandes M.D  [facility]    CC: Primary care physician; Trinna Post, PA-C

## 2020-10-28 NOTE — Plan of Care (Signed)

## 2020-10-28 NOTE — Progress Notes (Signed)
Discharge instructions and medications reviewed with patient. Patient denies further questions or needs at this time. Patient IV removed and telemetry d/c. CCMD notified of patient's discharge. Patient's belongings retrieved from safe by security. Patient ambulatory and escorted by this RN.

## 2020-10-28 NOTE — Progress Notes (Signed)
Progress Note  Patient Name: Gabriel [name]. Date of Encounter: 10/28/2020  [facility] Cardiologist: [name], MD   Subjective   Denies SOB,  Long discussion concerning recent weight gain, fluid intake, medications Reports compliance with his torsemide 60 daily Feels that he needs an emergency pill for diuresis  Inpatient Medications    Inpatient Medications: Scheduled Meds: . aspirin EC  81 mg Oral Daily  . carvedilol  6.25 mg Oral BID WC  . fenofibrate  160 mg Oral Daily  . insulin aspart  0-5 Units Subcutaneous QHS  . insulin aspart  0-9 Units Subcutaneous TID WC  . insulin glargine  20 Units Subcutaneous Q2200  . Ipratropium-Albuterol  1 puff Inhalation QID  . rivaroxaban  20 mg Oral Q supper  . rosuvastatin  20 mg Oral QHS  . sacubitril-valsartan  1 tablet Oral BID  . sodium chloride flush  3 mL Intravenous Q12H  . spironolactone  25 mg Oral BID   Continuous Infusions: . sodium chloride      Vital Signs    Vitals:   10/27/20 2021 10/28/20 0042 10/28/20 0355 10/28/20 0831  BP: 139/89 119/64 134/66 133/80  Pulse: 79 74 66 73  Resp: 18 15 16 20   Temp: 98.3 F (36.8 C) 98.3 F (36.8 C) 98.2 F (36.8 C) 98.6 F (37 C)  TempSrc: Oral   Oral  SpO2: 97% 96% 97% 97%  Weight:      Height:        Intake/Output Summary (Last 24 hours) at 10/28/2020 1719 Last data filed at 10/28/2020 1000 Gross per 24 hour  Intake --  Output 3650 ml  Net -3650 ml   Last 3 Weights 10/27/2020 10/27/2020 10/26/2020  Weight (lbs) 242 lb 245 lb 245 lb  Weight (kg) 109.77 kg 111.131 kg 111.131 kg      Telemetry    NSR  - Personally Reviewed  ECG     - Personally Reviewed  Physical Exam   GEN: No acute distress.   Neck:  JVD 8+ Cardiac: RRR, no murmurs, rubs, or gallops.  Respiratory: Clear to auscultation bilaterally. GI: Soft, nontender, non-distended  MS: No edema; No deformity. Neuro:  Nonfocal  Psych: Normal affect   Labs    High Sensitivity  Troponin:   Recent Labs  Lab 10/27/20 0739 10/27/20 0941 10/27/20 1218 10/27/20 1706  TROPONINIHS 45* 45* 44* 47*      Chemistry Recent Labs  Lab 10/27/20 0739 10/28/20 0553  NA 139 139  K 3.9 3.8  CL 106 102  CO2 23 28  GLUCOSE 282* 210*  BUN 22* 27*  CREATININE 1.22 1.28*  CALCIUM 9.3 9.9  PROT 7.0  --   ALBUMIN 3.7  --   AST 23  --   ALT 22  --   ALKPHOS 57  --   BILITOT 1.3*  --   GFRNONAA >60 >60  ANIONGAP 10 9     Hematology Recent Labs  Lab 10/27/20 0739 10/28/20 0553  WBC 7.5 8.4  RBC 5.60 5.72  HGB 15.8 16.1  HCT 48.0 49.3  MCV 85.7 86.2  MCH 28.2 28.1  MCHC 32.9 32.7  RDW 13.7 13.6  PLT 209 206    BNP Recent Labs  Lab 10/27/20 0941  BNP 510.1*     DDimer No results for input(s): DDIMER in the last 168 hours.   Radiology    DG Chest Port 1 View  Result Date: 10/27/2020 CLINICAL DATA:  Chest pain.  Shortness of breath. EXAM: PORTABLE CHEST 1 VIEW COMPARISON:  October 24, 21. FINDINGS: Similar interstitial prominence bilaterally. Similar scarring/atelectasis in the left midlung. No new consolidation. No visible pleural effusions or pneumothorax. Similar mild enlargement of the cardiac silhouette with pulmonary vascular congestion. No acute osseous abnormality. IMPRESSION: 1. Similar interstitial prominence bilaterally, which may represent mild interstitial edema versus the sequela of recurrent bouts of CHF. 2. Mild cardiomegaly and pulmonary vascular congestion. Electronically Signed   By: Margaretha Sheffield MD   On: 10/27/2020 08:23    Cardiac Studies   Echo 1. Left ventricular ejection fraction, by estimation, is 35 to 40%. The  left ventricle has moderately decreased function. The left ventricle  demonstrates global hypokinesis. The left ventricular internal cavity size  was moderately dilated. Left  ventricular diastolic parameters are indeterminate.  2. Right ventricular systolic function is normal. The right ventricular  size is  normal. There is mildly elevated pulmonary artery systolic  pressure. The estimated right ventricular systolic pressure is 67.2 mmHg.  3. Left atrial size was moderately dilated.  4. Mild to moderate mitral valve regurgitation.   Patient Profile     Gabriel [name]. is a 50 y.o. 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, NSVT, DVT/PE on Xarelto, COPD secondary to tobacco use (previous), asthma, DM2, CKD stage III, vasovagal syncope,  Neisseria meningitis PNA and bacteremia 12/2015, hypertension, and who is being seen today for the evaluation of chest pain and elevated high-sensitivity troponin   Assessment & Plan    Chest pain with history of CAD Known disease, Catheterization 05/2020 with CTO of the pRCA (L-R collaterals), mild to moderate nonobstructive CAD (LAD, LCx up to 50%s) -Symptoms this admission likely secondary to mild fluid overload No ischemic work-up needed On Xarelto, carvedilol  AOC HFrEF, mixed ICM/NICM  -Reports compliance with his torsemide 60 daily He does report high fluid intake Mondays and Tuesdays when he is not working and spending time at home Recommend he take extra torsemide 40-60 mg on Monday Tuesday in the p.m. and back to 60 mg daily on other days -Continue spironolactone Entresto carvedilol -In addition for significant fluid retention abdominal distention leg swelling cough PND, would take metolazone 2.5 sparingly -Stressed importance of fluid, salt restrictions Reports having difficulty getting to [place] there was interested in placement of a CardioMEMS .  Will try to arrange follow-up Would like to follow-up in CHF clinic in [facility] on a regular basis, advanced heart failure clinic [facility] every 3 months  HTN Blood pressure is well controlled on today's visit. No changes made to the medications.  HLD  Crestor 40 mg daily.  History of Subsegmental pulmonary embolism of the  right lower lobe Continue anticoagulation/Xarelto.   DM2 --SSI, per IM.  CHF education continued with him, all questions answered  Total encounter time more than 35 minutes  Greater than 50% was spent in counseling and coordination of care with the patient   For questions or updates, please contact [place] Please consult [email or web address] for contact info under        Signed, [name], MD  10/28/2020, 5:19 PM

## 2020-10-30 ENCOUNTER — Telehealth: Payer: Self-pay

## 2020-10-30 NOTE — Telephone Encounter (Signed)
Transition Care Management Follow-up Telephone Call  Date of discharge and from where: 10/28/2020 from [facility]  How have you been since you were released from the hospital? Pt stated that he is feeling much better. Pt stated that [facility] did not have his new medication yesterday, but pt will check today to see if it is ready.   Any questions or concerns? No  Items Reviewed:  Did the pt receive and understand the discharge instructions provided? Yes   Medications obtained and verified? Yes   Other? No   Any new allergies since your discharge? No   Dietary orders reviewed? n/a  Do you have support at home? Yes   Functional Questionnaire: (I = Independent and D = Dependent) ADLs: I  Bathing/Dressing- I  Meal Prep- I  Eating- I  Maintaining continence- I  Transferring/Ambulation- I  Managing Meds- I   Follow up appointments reviewed:   PCP Hospital f/u appt confirmed? No  Pt stated that he had to work out with Medicaid who his primary care provider is  Are transportation arrangements needed? No   If their condition worsens, is the pt aware to call PCP or go to the Emergency Dept.? Yes  Was the patient provided with contact information for the PCP's office or ED? Yes  Was to pt encouraged to call back with questions or concerns? Yes

## 2020-10-30 NOTE — Telephone Encounter (Signed)
Transition Care Management Follow-up Telephone Call  Date of discharge and from where: [facility] on 10/28/20  How have you been since you were released from the hospital? Doing much better and not having anymore chest pain or SOB. Pt states he is resting ok but is still waiting to get his CPAP machine from the sleep center. Pt states he has not had much of an appetite but that has been going on for about 6 months. Energy level has been normal since d/c. Declines pain, swelling, fever or n/v/d.   Any questions or concerns? No   Items Reviewed:  Did the pt receive and understand the discharge instructions provided? Yes   Medications obtained and verified? Declined verifying over the phone- will verify at [place] apt.   Any new allergies since your discharge? No   Dietary orders reviewed? Yes  Do you have support at home? Yes   Other (ie: DME, Home Health, etc): N/A  Functional Questionnaire: (I = Independent and D = Dependent)  Bathing/Dressing- I   Meal Prep- I  Eating- I  Maintaining continence- I  Transferring/Ambulation- I  Managing Meds- I   Follow up appointments reviewed:    PCP Hospital f/u appt confirmed? No, pt is unable to schedule a HFU apt due to an insurance issue. Pt plans to resolve issue and CB to schedule apt.   [facility] f/u appt confirmed? No, pt is going to call Dr Donivan Scull office Wednesday if he has not been contacted yet.  Are transportation arrangements needed? No   If their condition worsens, is the pt aware to call  their PCP or go to the ED? Yes  Was the patient provided with contact information for the PCP's office or ED? Yes  Was the pt encouraged to call back with questions or concerns? Yes

## 2020-10-30 NOTE — Telephone Encounter (Signed)
No HFU scheduled at this time. 

## 2020-10-31 NOTE — Procedures (Signed)
  Patient Name: Gabriel [name], Gabriel [name] Date: 10/26/2020 Gender: Male D.O.B: 1971-05-06 Age (years): 68 Referring Provider: Darrick Grinder NP Height (inches): 72 Interpreting Physician: Fransico Him MD, ABSM Weight (lbs): 245 RPSGT: Zadie Rhine BMI: 33 MRN: [id number] Neck Size: 20.50  CLINICAL INFORMATION The patient is referred for a BiPAP titration to treat sleep apnea.  SLEEP STUDY TECHNIQUE As per the AASM Manual for the Scoring of Sleep and Associated Events v2.3 (April 2016) with a hypopnea requiring 4% desaturations.  The channels recorded and monitored were frontal, central and occipital EEG, electrooculogram (EOG), submentalis EMG (chin), nasal and oral airflow, thoracic and abdominal wall motion, anterior tibialis EMG, snore microphone, electrocardiogram, and pulse oximetry. Bilevel positive airway pressure (BPAP) was initiated at the beginning of the study and titrated to treat sleep-disordered breathing.  MEDICATIONS Medications self-administered by patient taken the night of the study : N/A  RESPIRATORY PARAMETERS Optimal IPAP Pressure (cm): N/A AHI at Optimal Pressure (/hr) N/A Optimal EPAP Pressure (cm):N/A  Overall Minimal O2 (%):85.0  Minimal O2 at Optimal Pressure (%): 93.0  SLEEP ARCHITECTURE Start Time:9:59:46 PM  Stop Time:4:39:44 AM  Total Time (min):400  Total Sleep Time (min):275 Sleep Latency (min):31.4  Sleep Efficiency (%):68.8%  REM Latency (min):137.5  WASO (min):93.5 Stage N1 (%):10.9%  Stage N2 (%):75.3%  Stage N3 (%):0.0%  Stage R (%):13.8 Supine (%):68.36  Arousal Index (/hr):36.2   CARDIAC DATA The 2 lead EKG demonstrated sinus rhythm. The mean heart rate was 74.0 beats per minute. Other EKG findings include: None.  LEG MOVEMENT DATA The total Periodic Limb Movements of Sleep (PLMS) were 0. The PLMS index was 0.0. A PLMS index of <15 is considered normal in adults.  IMPRESSIONS - An optimal PAP pressure could not be selected due to  ongoing respiratory events - Central sleep apnea was not noted during this titration (CAI = 0.4/h). - Moderete oxygen desaturations were observed during this titration (min O2 = 85.0%). - No snoring was audible during this study. - No cardiac abnormalities were observed during this study. - Clinically significant periodic limb movements were not noted during this study. Arousals associated with PLMs were rare.  DIAGNOSIS - Obstructive Sleep Apnea (G47.33)  RECOMMENDATIONS - Trial of auto BiPAP therapy with IPAP max 25cm H2O, EPAP min 6cm H2O and PS 4cm with a Large size Fisher&Paykel Full Face Mask Simplus mask and heated humidification. - Avoid alcohol, sedatives and other CNS depressants that may worsen sleep apnea and disrupt normal sleep architecture. - Sleep hygiene should be reviewed to assess factors that may improve sleep quality. - Weight management and regular exercise should be initiated or continued. - Return to Sleep Center for re-evaluation after 6 weeks of therapy  [Electronically signed] 10/31/2020 10:41 PM  Fransico Him MD, ABSM Diplomate, American Board of Sleep Medicine

## 2020-11-02 ENCOUNTER — Other Ambulatory Visit: Payer: Self-pay | Admitting: Internal Medicine

## 2020-11-06 ENCOUNTER — Telehealth: Payer: Self-pay | Admitting: *Deleted

## 2020-11-06 NOTE — Telephone Encounter (Signed)
Informed patient of sleep study results and patient understanding was verbalized. Patient understands her sleep study showed they had a successful PAP titration.   Upon patient request DME selection is [facility]. Patient understands she/he will be contacted by [facility] to set up her/he cpap. Patient understands to call if [facility] does not contact her/he with new setup in a timely manner. Patient understands they will be called once confirmation has been received from [facility] that they have received their new machine to schedule 10 week follow up appointment.   [facility] notified of new cpap order  Please add to airview Per DPR: Left detailed message on voicemail and informed patient to call back with questions.

## 2020-11-06 NOTE — Telephone Encounter (Signed)
-----   Message from Sueanne Margarita, MD sent at 10/31/2020 10:44 PM EDT ----- Please let patient know that they had a successful PAP titration and let DME know that orders are in EPIC.  Please set up 6 week OV with me.

## 2020-11-07 ENCOUNTER — Other Ambulatory Visit: Payer: Self-pay | Admitting: Internal Medicine

## 2020-11-07 DIAGNOSIS — I2699 Other pulmonary embolism without acute cor pulmonale: Secondary | ICD-10-CM

## 2020-11-07 MED ORDER — RIVAROXABAN 20 MG PO TABS: 20.0000 mg | ORAL_TABLET | Freq: Every day | ORAL | 0 refills | Status: DC

## 2020-11-07 NOTE — Addendum Note (Signed)
Addended by: Delice Bison E on: 11/07/2020 09:01 AM   Modules accepted: Orders

## 2020-11-07 NOTE — Progress Notes (Signed)
I sent the script for 30 days; no refills.   C- please schedule appt pt follow up with me- in next 2-3 weeks;MD; labs- cbc/cmp- Dr.B

## 2020-11-14 ENCOUNTER — Encounter (HOSPITAL_COMMUNITY): Payer: Medicaid Other | Admitting: Cardiology

## 2020-11-14 IMAGING — CR DG LUMBAR SPINE 2-3V
1 series · 3 of 3 positions shown · non-contrast
Comparison: Plain films lumbar spine 04/10/2017.

CLINICAL DATA: Low back pain radiating into the left upper leg. No
known injury.

EXAM:
LUMBAR SPINE - 2-3 VIEW

[Series 1: dg lumbar spine 2-3 views · 0.14mm/px · 3 of 3 slices shown]
[im 1/3]
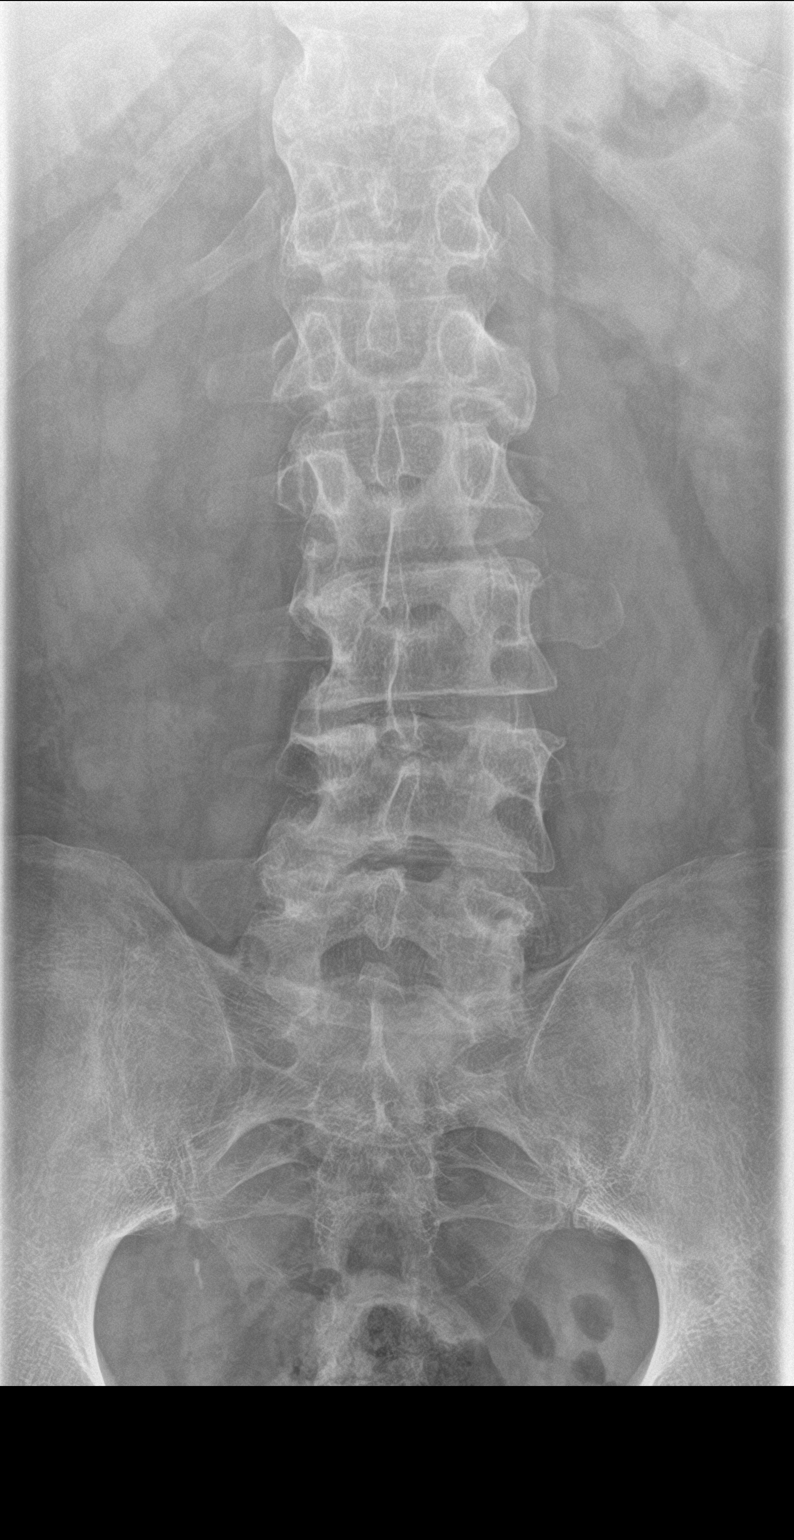
[im 2/3]
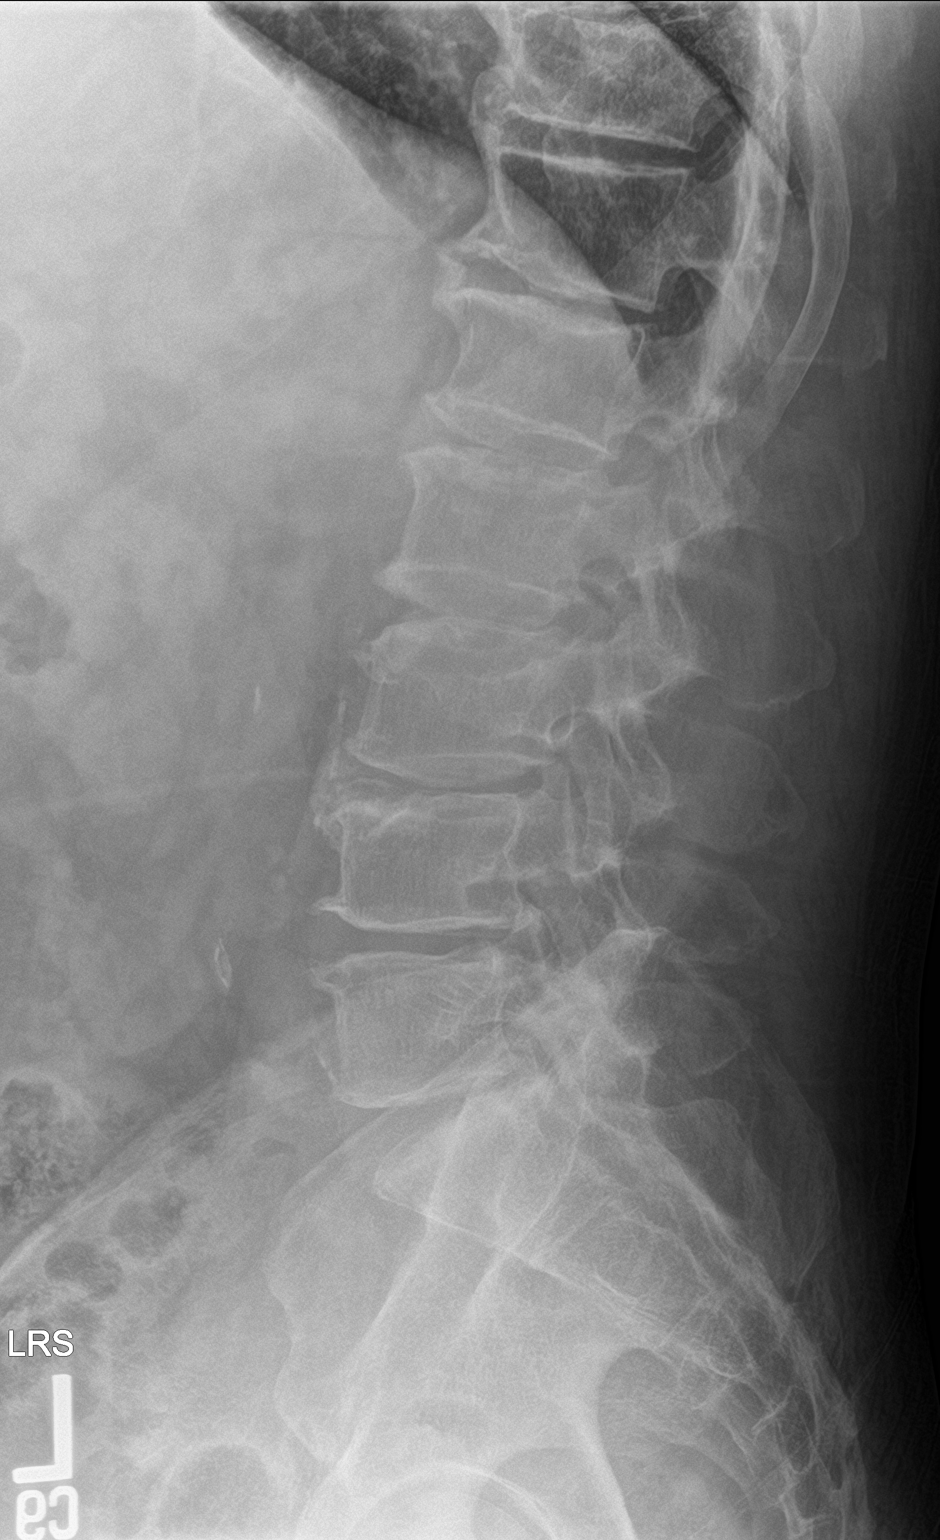
[im 3/3]
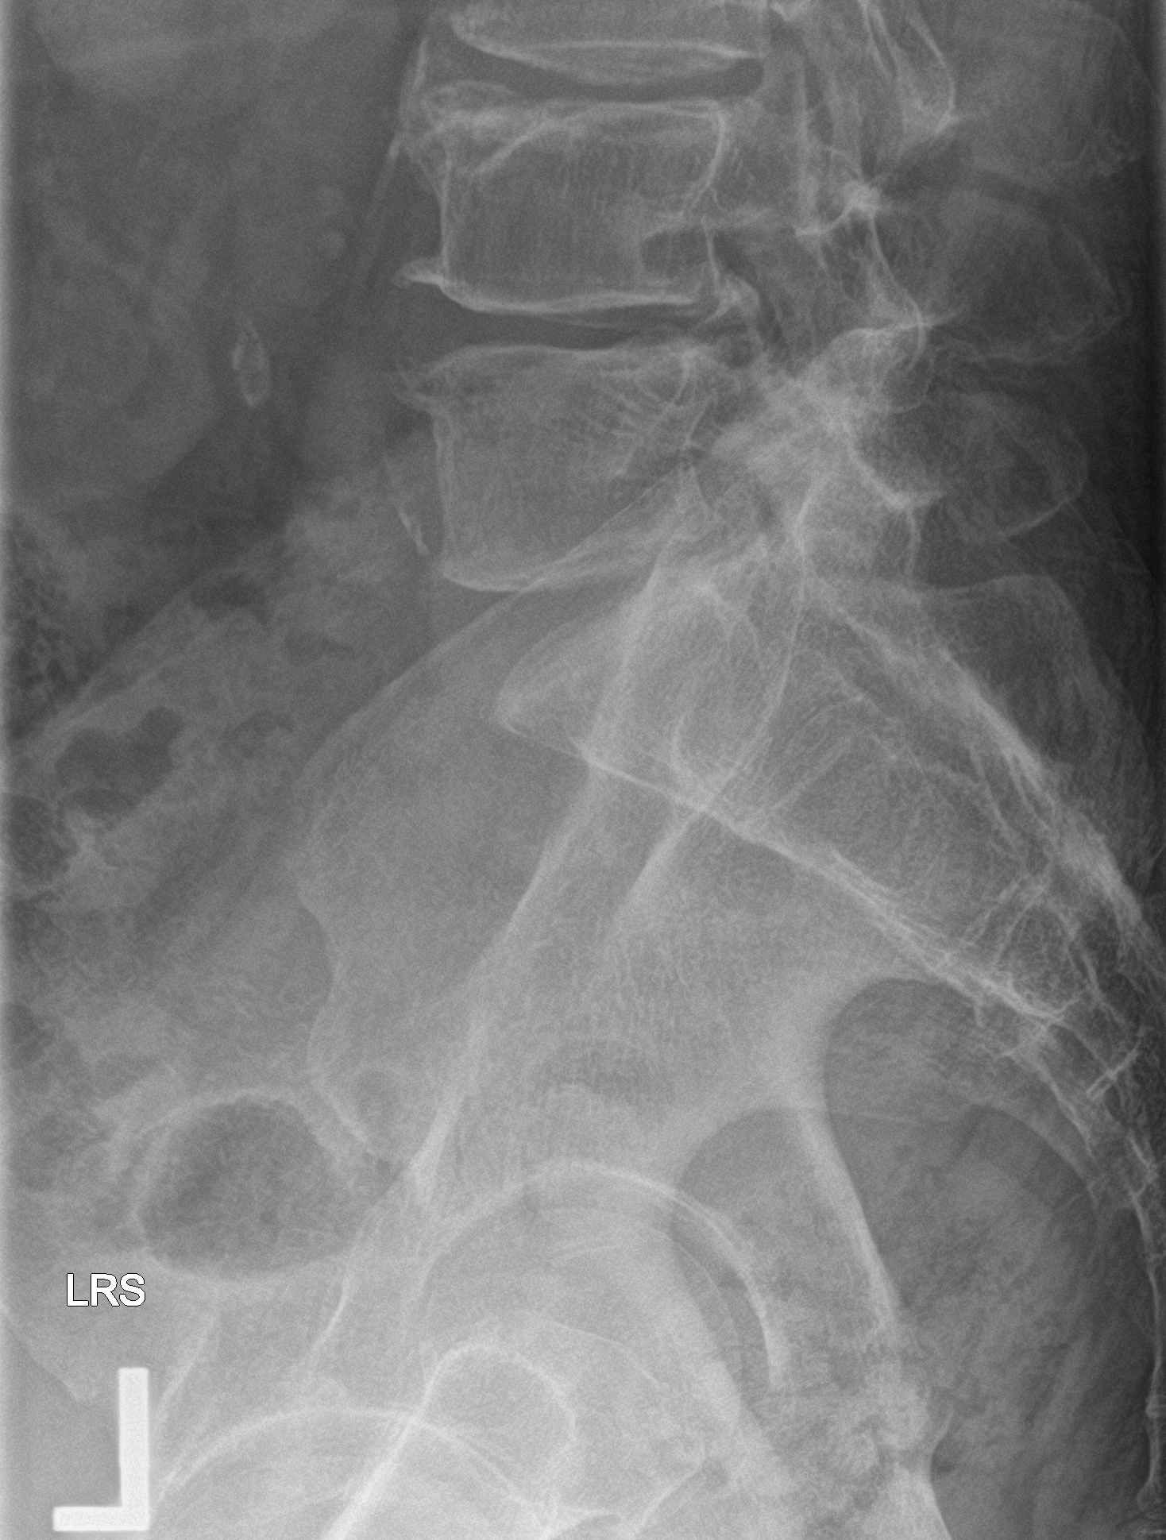

[3 of 3 positions shown; findings below may reference images not displayed]

FINDINGS: Convex left scoliosis with the apex at L3-4 is again seen. 0.5 cm
retrolisthesis L3 on L4 is unchanged. Multilevel degenerative disc
disease appears worse than on the prior examination, particularly at
the L2-3, L3-4 and L4-5 levels. Aortic atherosclerosis noted.
IMPRESSION: No acute finding.

Progressive lung bar spondylosis, particularly from L3-L5.

Atherosclerosis.

## 2020-11-22 ENCOUNTER — Emergency Department
Admission: EM | Admit: 2020-11-22 | Discharge: 2020-11-22 | Disposition: A | Discharge status: Home / Self Care | Payer: Medicaid Other | Attending: Emergency Medicine | Admitting: Emergency Medicine

## 2020-11-22 ENCOUNTER — Telehealth: Payer: Self-pay

## 2020-11-22 ENCOUNTER — Other Ambulatory Visit: Payer: Self-pay

## 2020-11-22 DIAGNOSIS — Z794 Long term (current) use of insulin: Secondary | ICD-10-CM | POA: Insufficient documentation

## 2020-11-22 DIAGNOSIS — Z79899 Other long term (current) drug therapy: Secondary | ICD-10-CM | POA: Insufficient documentation

## 2020-11-22 DIAGNOSIS — E1122 Type 2 diabetes mellitus with diabetic chronic kidney disease: Secondary | ICD-10-CM | POA: Insufficient documentation

## 2020-11-22 DIAGNOSIS — R799 Abnormal finding of blood chemistry, unspecified: Secondary | ICD-10-CM

## 2020-11-22 DIAGNOSIS — N1831 Chronic kidney disease, stage 3a: Secondary | ICD-10-CM | POA: Diagnosis not present

## 2020-11-22 DIAGNOSIS — I2510 Atherosclerotic heart disease of native coronary artery without angina pectoris: Secondary | ICD-10-CM | POA: Insufficient documentation

## 2020-11-22 DIAGNOSIS — E860 Dehydration: Secondary | ICD-10-CM | POA: Diagnosis not present

## 2020-11-22 DIAGNOSIS — R7989 Other specified abnormal findings of blood chemistry: Secondary | ICD-10-CM

## 2020-11-22 DIAGNOSIS — I130 Hypertensive heart and chronic kidney disease with heart failure and stage 1 through stage 4 chronic kidney disease, or unspecified chronic kidney disease: Secondary | ICD-10-CM | POA: Diagnosis not present

## 2020-11-22 DIAGNOSIS — I5043 Acute on chronic combined systolic (congestive) and diastolic (congestive) heart failure: Secondary | ICD-10-CM | POA: Insufficient documentation

## 2020-11-22 DIAGNOSIS — Z7901 Long term (current) use of anticoagulants: Secondary | ICD-10-CM | POA: Insufficient documentation

## 2020-11-22 DIAGNOSIS — Z87891 Personal history of nicotine dependence: Secondary | ICD-10-CM | POA: Diagnosis not present

## 2020-11-22 DIAGNOSIS — R55 Syncope and collapse: Secondary | ICD-10-CM

## 2020-11-22 LAB — CBC
HCT: 51.9 % (ref 39.0–52.0)
Hemoglobin: 17.6 g/dL — ABNORMAL HIGH (ref 13.0–17.0)
MCH: 27.8 pg (ref 26.0–34.0)
MCHC: 33.9 g/dL (ref 30.0–36.0)
MCV: 82.1 fL (ref 80.0–100.0)
Platelets: 204 10*3/uL (ref 150–400)
RBC: 6.32 MIL/uL — ABNORMAL HIGH (ref 4.22–5.81)
RDW: 13.6 % (ref 11.5–15.5)
WBC: 10.2 10*3/uL (ref 4.0–10.5)
nRBC: 0 % (ref 0.0–0.2)

## 2020-11-22 LAB — BASIC METABOLIC PANEL
Anion gap: 14 (ref 5–15)
BUN: 51 mg/dL — ABNORMAL HIGH (ref 6–20)
CO2: 28 mmol/L (ref 22–32)
Calcium: 8.9 mg/dL (ref 8.9–10.3)
Chloride: 96 mmol/L — ABNORMAL LOW (ref 98–111)
Creatinine, Ser: 2.44 mg/dL — ABNORMAL HIGH (ref 0.61–1.24)
GFR, Estimated: 31 mL/min — ABNORMAL LOW (ref >60)
Glucose, Bld: 297 mg/dL — ABNORMAL HIGH (ref 70–99)
Potassium: 3.8 mmol/L (ref 3.5–5.1)
Sodium: 138 mmol/L (ref 135–145)

## 2020-11-22 LAB — TROPONIN I (HIGH SENSITIVITY): Troponin I (High Sensitivity): 50 ng/L — ABNORMAL HIGH (ref <18)

## 2020-11-22 MED ORDER — SODIUM CHLORIDE 0.9 % IV BOLUS
500.0000 mL | Freq: Once | INTRAVENOUS | Status: AC
  Administered 2020-11-22: 500 mL via INTRAVENOUS

## 2020-11-22 NOTE — Telephone Encounter (Signed)
Was able to reach pt via phone, was seen in the ED earlier today for near syncope, consulted by Dr. Garen Lah who advised this nurse that they are decreasing torsemide to 40 mg daily from his 60 mg d/t elevated BUN 51 & Creatinine 2.44.  Repeat BMP in 5 days, advised to go to [facility] to have labs redrawn, pt verbalized understanding. Also f/u apt needed in 7 days as well per Dr. Garen Lah verbal order.  Schedule with Ignacia Bayley 4/14 at 8am. Pt verbalized understanding, reports he is feeing much better since leaving the ED. At this time all questions and concerns were address and no additional concerns at this time. Agreeable to plan, will call back for anything further.

## 2020-11-22 NOTE — ED Provider Notes (Signed)
[facility] Emergency Department Provider Note   ____________________________________________    I have reviewed the triage vital signs and the nursing notes.   HISTORY  Chief Complaint Near syncope    HPI Gabriel Masterson Prescott Truex. is a 50 y.o. male with history of CHF, CKD, chronic elevated troponin, COPD, diabetes, hypertension who presents after a near syncopal episode.  Patient reports he was at work, bent over to pick something up, stood up and felt very lightheaded, had to sit down because he was feeling dizzy.  Feeling improved now.  Denies chest pain or palpitations.  No shortness of breath.  No nausea or vomiting.  Has been compliant with his torsemide 60 mg daily.  Has not had to use his metolazone.  Past Medical History:  Diagnosis Date  . Chest wall pain, chronic   . Chronic systolic CHF (congestive heart failure) ([place])   . Chronic Troponin Elevation   . CKD (chronic kidney disease), stage II-III   . COPD (chronic obstructive pulmonary disease) ([place])   . Diabetes mellitus without complication ([place])   . Hypertension    Resolved since weight loss  . Myocardial infarct ([place])   . NICM (nonischemic cardiomyopathy) ([place])    a. 03/2015 Echo: EF 45-50%; b. 12/2015 Echo: EF 20-25%; c. 02/2016 Echo: EF 30-35%; d. 11/2016 Echo: EF 40-45%; e. 06/2019 Echo: EF 30-35%; f. 11/2019 Echo: EF 25-30%, glob HK. Mild LVH. Gr2 DD. Mild-mod AI. BAE. Triv MR.  . Non-obstructive CAD (coronary artery disease)    a. 04/2015 low risk MV;  b. 12/2016 Cath: minor irregs in LAD/Diag/LCX/OM, RCA 40p/m/d.  [name] (nonsustained ventricular tachycardia) ([place])    a. 12/2015 noted on tele-->amio;  b. 12/2015 Event monitor: no VT. Atach noted.  . Obesity (BMI 30.0-34.9)   . Psoriasis   . Syncope    a. 01/2016 - felt to be vasovagal.  . Tobacco abuse     Patient Active Problem List   Diagnosis Date Noted  . Chest pain 10/27/2020  . Snoring 08/15/2020  . Type II diabetes mellitus with  renal manifestations ([place]) 06/11/2020  . COPD (chronic obstructive pulmonary disease) ([place]) 06/11/2020  . Leukocytosis 06/11/2020  . PE (pulmonary thromboembolism) ([place]) 06/11/2020  . Hyperglycemia   . Elevated brain natriuretic peptide (BNP) level   . Uncontrolled type 2 diabetes mellitus with hyperglycemia, with long-term current use of insulin ([place]) 05/13/2020  . Acute on chronic HFrEF (heart failure with reduced ejection fraction) ([place]) 05/12/2020  . Erectile dysfunction due to arterial insufficiency 03/15/2020  . Screening PSA (prostate specific antigen) 03/15/2020  . Erectile dysfunction 03/02/2020  . Acute pulmonary embolism ([place]) 01/03/2020  . Pulmonary embolism on right ([place])   . 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 Hemoptysis   . Costochondritis   . Acute respiratory failure with hypoxia ([place])   . Acute on chronic combined systolic (congestive) and diastolic (congestive) heart failure ([place]) 12/20/2019  . Acute on chronic heart failure ([place]) 11/22/2019  . CKD (chronic kidney disease), stage IIIa 06/26/2019  . Type 2 diabetes mellitus with hyperlipidemia ([place]) 06/26/2019  . Hypertensive urgency 06/26/2019  . Acute on chronic combined systolic and diastolic CHF (congestive heart failure) ([place]) 09/02/2017  . Other chest pain 08/24/2017  . Dizziness   . Noncompliance with medications 04/29/2017  . Back pain 03/06/2017  . Tobacco use 12/04/2016  . Gallbladder sludge 12/04/2016  . Acute on chronic systolic CHF (congestive heart failure) ([place]) 11/28/2016  . Coronary artery disease, non-occlusive  03/15/2016  . Atypical chest pain 02/16/2016  . Orthostatic hypotension 01/23/2016  . Hypomagnesemia 01/23/2016  . Chronic systolic CHF (congestive heart failure) ([place]) 01/23/2016  . NICM (nonischemic cardiomyopathy) ([place]) 01/17/2016  . NSVT (nonsustained ventricular tachycardia) ([place])   . Troponin I above reference range   . Hyperlipidemia 05/17/2015  . COPD exacerbation ([place])  05/17/2015  . Essential hypertension 03/31/2015  . Elevated troponin 03/20/2015    Past Surgical History:  Procedure Laterality Date  . AMPUTATION    . CARDIAC CATHETERIZATION    . FINGER AMPUTATION     Traumatic  . FINGER FRACTURE SURGERY Left   . LEFT HEART CATH AND CORONARY ANGIOGRAPHY N/A 01/06/2017   Procedure: Left Heart Cath and Coronary Angiography;  Surgeon: Wellington Hampshire, MD;  Location: [facility];  Service: Cardiovascular;  Laterality: N/A;  . RIGHT/LEFT HEART CATH AND CORONARY ANGIOGRAPHY N/A 06/13/2020   Procedure: RIGHT/LEFT HEART CATH AND CORONARY ANGIOGRAPHY;  Surgeon: Nelva Bush, MD;  Location: [facility];  Service: Cardiovascular;  Laterality: N/A;    Prior to Admission medications   Medication Sig Start Date End Date Taking? Authorizing Provider  B-D UF III MINI PEN NEEDLES 31G X 5 MM MISC 1 DOSE BY DOES NOT APPLY ROUTE 4 (FOUR) TIMES DAILY - WITH MEALS AND AT BEDTIME. 06/12/20   Lorella Nimrod, MD  blood glucose meter kit and supplies KIT Dispense based on patient and insurance preference. Use up to four times daily as directed. (FOR ICD-9 250.00, 250.01). 06/09/20   Lorella Nimrod, MD  carvedilol (COREG) 6.25 MG tablet Take 1 tablet (6.25 mg total) by mouth 2 (two) times daily with a meal. 08/02/20   Larey Dresser, MD  COMBIVENT RESPIMAT 20-100 MCG/ACT AERS respimat Inhale 1 puff into the lungs 4 (four) times daily. 10/26/20   [provider]  fenofibrate (TRICOR) 145 MG tablet Take 1 tablet (145 mg total) by mouth daily. 06/09/20   Lorella Nimrod, MD  Insulin Glargine (BASAGLAR KWIKPEN) 100 UNIT/ML Inject 28 Units into the skin at bedtime. 06/09/20   Lorella Nimrod, MD  metolazone (ZAROXOLYN) 2.5 MG tablet Take 1 tablet (2.5 mg total) by mouth daily as needed (sob,leg edema). 10/29/20   Fritzi Mandes, MD  nitroGLYCERIN (NITROSTAT) 0.4 MG SL tablet Place 1 tablet (0.4 mg total) under the tongue every 5 (five) minutes x 3 doses as needed  for chest pain. 06/09/20   Lorella Nimrod, MD  rivaroxaban (XARELTO) 20 MG TABS tablet Take 1 tablet (20 mg total) by mouth daily with supper. 11/07/20   Cammie Sickle, MD  rosuvastatin (CRESTOR) 20 MG tablet Take 1 tablet (20 mg total) by mouth at bedtime. 06/09/20   Lorella Nimrod, MD  sacubitril-valsartan (ENTRESTO) 49-51 MG Take 1 tablet by mouth 2 (two) times daily. 08/02/20   Larey Dresser, MD  spironolactone (ALDACTONE) 25 MG tablet Take 1 tablet (25 mg total) by mouth 2 (two) times daily. 08/02/20   Larey Dresser, MD  torsemide (DEMADEX) 20 MG tablet Take 3 tablets (60 mg total) by mouth daily. 10/28/20   Fritzi Mandes, MD  torsemide 60 MG TABS Take 60 mg by mouth daily as needed. For increasing SOB and weight again (this is extra from your daily 60 mg dosing) 10/28/20   Fritzi Mandes, MD  TRULICITY 4.81 EH/6.3JS SOPN INJECT 0.75 MG INTO THE SKIN ONCE A WEEK. 09/28/20   Trinna Post, PA-C     Allergies Metformin and related, Prednisone, and Zantac [ranitidine  hcl]  Family History  Problem Relation Age of Onset  . Diabetes Mellitus II Mother   . Hypothyroidism Mother   . Hypertension Mother   . Hypertension Father   . Gout Father   . Cancer Paternal Aunt   . Cancer Maternal Grandfather   . Diabetes Maternal Grandfather     Social History Social History   Tobacco Use  . Smoking status: Former Smoker    Packs/day: 0.50    Years: 33.00    Pack years: 16.50    Types: Cigarettes    Quit date: 05/08/2020    Years since quitting: 0.5  . Smokeless tobacco: Former Network engineer  . Vaping Use: Former  Substance Use Topics  . Alcohol use: Yes    Alcohol/week: 0.0 standard drinks    Comment: occassionally  . Drug use: No    Review of Systems  Constitutional: No fever/chills Eyes: No visual changes.  ENT: No sore throat. Cardiovascular: Denies chest pain. Respiratory: Denies shortness of breath. Gastrointestinal: No abdominal pain.    Genitourinary: Negative  for dysuria. Musculoskeletal: Negative for back pain. Skin: Negative for rash. Neurological: Negative for headaches    ____________________________________________   PHYSICAL EXAM:  VITAL SIGNS: ED Triage Vitals  Enc Vitals Group     BP 11/22/20 1304 (!) 89/68     Pulse Rate 11/22/20 1304 98     Resp 11/22/20 1304 18     Temp 11/22/20 1304 97.7 F (36.5 C)     Temp Source 11/22/20 1304 Oral     SpO2 11/22/20 1304 96 %     Weight --      Height --      Head Circumference --      Peak Flow --      Pain Score 11/22/20 1258 0     Pain Loc --      Pain Edu? --      Excl. in [place]? --     Constitutional: Alert and oriented.   Nose: No congestion/rhinnorhea. Mouth/Throat: Mucous membranes are moist.    Cardiovascular: Normal rate, regular rhythm. Grossly normal heart sounds.  Good peripheral circulation. Respiratory: Normal respiratory effort.  No retractions. Lungs CTAB. Gastrointestinal: Soft and nontender. No distention.  No CVA tenderness.  Musculoskeletal: No lower extremity tenderness nor edema.  Warm and well perfused Neurologic:  Normal speech and language. No gross focal neurologic deficits are appreciated.  Skin:  Skin is warm, dry and intact. No rash noted. Psychiatric: Mood and affect are normal. Speech and behavior are normal.  ____________________________________________   LABS (all labs ordered are listed, but only abnormal results are displayed)  Labs Reviewed  BASIC METABOLIC PANEL - Abnormal; Notable for the following components:      Result Value   Chloride 96 (*)    Glucose, Bld 297 (*)    BUN 51 (*)    Creatinine, Ser 2.44 (*)    GFR, Estimated 31 (*)    All other components within normal limits  CBC - Abnormal; Notable for the following components:   RBC 6.32 (*)    Hemoglobin 17.6 (*)    All other components within normal limits  TROPONIN I (HIGH SENSITIVITY) - Abnormal; Notable for the following components:   Troponin I (High Sensitivity)  50 (*)    All other components within normal limits  URINALYSIS, COMPLETE (UACMP) WITH MICROSCOPIC  CBG MONITORING, ED   ____________________________________________  EKG  ED ECG REPORT I, Lavonia Drafts, the attending physician, personally  viewed and interpreted this ECG.  Date: 11/22/2020  Rhythm: normal sinus rhythm QRS Axis: normal Intervals: normal ST/T Wave abnormalities: Nonspecific T wave abnormality Narrative Interpretation: no evidence of acute ischemia  ____________________________________________  RADIOLOGY  None ____________________________________________   PROCEDURES  Procedure(s) performed: No  Procedures   Critical Care performed: No ____________________________________________   INITIAL IMPRESSION / ASSESSMENT AND PLAN / ED COURSE  Pertinent labs & imaging results that were available during my care of the patient were reviewed by me and considered in my medical decision making (see chart for details).  Patient presents after near syncopal episode as described above.  Overall he is well-appearing here initial blood pressure was borderline but on recheck has improved.  Lab work is notable for elevation in creatinine with elevated BUN consistent with dehydration.  Likely the patient has been over diuresed which caused his near syncopal episode today and has caused his elevation in creatinine.  Discussed with Dr. Garen Lah of cardiology who recommends 500 cc bolus, decreasing torsemide to 40 mg daily and close outpatient follow-up for recheck creatinine.  ----------------------------------------- 4:16 PM on 11/22/2020 -----------------------------------------  After 500 cc bolus, orthostatics performed, patient without dizziness, reassuring vitals.  Will decrease torsemide, close follow-up, patient agrees with plan.   ____________________________________________   FINAL CLINICAL IMPRESSION(S) / ED DIAGNOSES  Final diagnoses:  Dehydration   Near syncope        Note:  This document was prepared using Dragon voice recognition software and may include unintentional dictation errors.   Lavonia Drafts, MD 11/22/20 [phone]

## 2020-11-22 NOTE — ED Triage Notes (Signed)
Pt comes via EMS from work with c/o near syncopal episode. Pt may have vagaled  down. Pt denies any urination today. Pt takes fluid pill. ST on EKG  Pt is orthostatic per EMS  BP-97/64, CBG-351, T-97.6, 94% RA  Pt has hx of CHF, COPD

## 2020-11-22 NOTE — Discharge Instructions (Signed)
Please decrease torsemide dose to 40 mg once a day and follow up closely with [name] as we discussed

## 2020-11-23 ENCOUNTER — Other Ambulatory Visit: Payer: Self-pay

## 2020-11-23 ENCOUNTER — Emergency Department: Payer: Medicaid Other

## 2020-11-23 ENCOUNTER — Telehealth: Payer: Self-pay | Admitting: Nurse Practitioner

## 2020-11-23 ENCOUNTER — Inpatient Hospital Stay
Admission: EM | Admit: 2020-11-23 | Discharge: 2020-11-27 | DRG: 683 | Disposition: A | Discharge status: Home / Self Care | Payer: Medicaid Other | Attending: Internal Medicine | Admitting: Internal Medicine

## 2020-11-23 DIAGNOSIS — R531 Weakness: Secondary | ICD-10-CM

## 2020-11-23 DIAGNOSIS — I472 Ventricular tachycardia: Secondary | ICD-10-CM | POA: Diagnosis present

## 2020-11-23 DIAGNOSIS — Z8249 Family history of ischemic heart disease and other diseases of the circulatory system: Secondary | ICD-10-CM

## 2020-11-23 DIAGNOSIS — I130 Hypertensive heart and chronic kidney disease with heart failure and stage 1 through stage 4 chronic kidney disease, or unspecified chronic kidney disease: Secondary | ICD-10-CM | POA: Diagnosis present

## 2020-11-23 DIAGNOSIS — E669 Obesity, unspecified: Secondary | ICD-10-CM | POA: Diagnosis present

## 2020-11-23 DIAGNOSIS — Z79899 Other long term (current) drug therapy: Secondary | ICD-10-CM

## 2020-11-23 DIAGNOSIS — E785 Hyperlipidemia, unspecified: Secondary | ICD-10-CM | POA: Diagnosis present

## 2020-11-23 DIAGNOSIS — N179 Acute kidney failure, unspecified: Principal | ICD-10-CM | POA: Diagnosis present

## 2020-11-23 DIAGNOSIS — Z86711 Personal history of pulmonary embolism: Secondary | ICD-10-CM | POA: Diagnosis present

## 2020-11-23 DIAGNOSIS — I10 Essential (primary) hypertension: Secondary | ICD-10-CM | POA: Diagnosis present

## 2020-11-23 DIAGNOSIS — Z20822 Contact with and (suspected) exposure to covid-19: Secondary | ICD-10-CM | POA: Diagnosis present

## 2020-11-23 DIAGNOSIS — J441 Chronic obstructive pulmonary disease with (acute) exacerbation: Secondary | ICD-10-CM | POA: Diagnosis present

## 2020-11-23 DIAGNOSIS — Z888 Allergy status to other drugs, medicaments and biological substances status: Secondary | ICD-10-CM

## 2020-11-23 DIAGNOSIS — I252 Old myocardial infarction: Secondary | ICD-10-CM

## 2020-11-23 DIAGNOSIS — R079 Chest pain, unspecified: Secondary | ICD-10-CM

## 2020-11-23 DIAGNOSIS — Z9989 Dependence on other enabling machines and devices: Secondary | ICD-10-CM

## 2020-11-23 DIAGNOSIS — I2782 Chronic pulmonary embolism: Secondary | ICD-10-CM | POA: Diagnosis present

## 2020-11-23 DIAGNOSIS — N1831 Chronic kidney disease, stage 3a: Secondary | ICD-10-CM | POA: Diagnosis present

## 2020-11-23 DIAGNOSIS — I428 Other cardiomyopathies: Secondary | ICD-10-CM | POA: Diagnosis present

## 2020-11-23 DIAGNOSIS — R0789 Other chest pain: Secondary | ICD-10-CM | POA: Diagnosis present

## 2020-11-23 DIAGNOSIS — G4733 Obstructive sleep apnea (adult) (pediatric): Secondary | ICD-10-CM | POA: Diagnosis present

## 2020-11-23 DIAGNOSIS — Z833 Family history of diabetes mellitus: Secondary | ICD-10-CM

## 2020-11-23 DIAGNOSIS — I959 Hypotension, unspecified: Secondary | ICD-10-CM | POA: Diagnosis present

## 2020-11-23 DIAGNOSIS — E876 Hypokalemia: Secondary | ICD-10-CM | POA: Diagnosis present

## 2020-11-23 DIAGNOSIS — I248 Other forms of acute ischemic heart disease: Secondary | ICD-10-CM | POA: Diagnosis present

## 2020-11-23 DIAGNOSIS — E860 Dehydration: Secondary | ICD-10-CM | POA: Diagnosis present

## 2020-11-23 DIAGNOSIS — Z89022 Acquired absence of left finger(s): Secondary | ICD-10-CM

## 2020-11-23 DIAGNOSIS — I5022 Chronic systolic (congestive) heart failure: Secondary | ICD-10-CM | POA: Diagnosis present

## 2020-11-23 DIAGNOSIS — E8881 Metabolic syndrome: Secondary | ICD-10-CM | POA: Diagnosis present

## 2020-11-23 DIAGNOSIS — R739 Hyperglycemia, unspecified: Secondary | ICD-10-CM | POA: Diagnosis present

## 2020-11-23 DIAGNOSIS — Z809 Family history of malignant neoplasm, unspecified: Secondary | ICD-10-CM

## 2020-11-23 DIAGNOSIS — Z87891 Personal history of nicotine dependence: Secondary | ICD-10-CM

## 2020-11-23 DIAGNOSIS — Z794 Long term (current) use of insulin: Secondary | ICD-10-CM

## 2020-11-23 DIAGNOSIS — Z7901 Long term (current) use of anticoagulants: Secondary | ICD-10-CM

## 2020-11-23 DIAGNOSIS — E1122 Type 2 diabetes mellitus with diabetic chronic kidney disease: Secondary | ICD-10-CM | POA: Diagnosis present

## 2020-11-23 DIAGNOSIS — I2699 Other pulmonary embolism without acute cor pulmonale: Secondary | ICD-10-CM | POA: Diagnosis present

## 2020-11-23 DIAGNOSIS — E782 Mixed hyperlipidemia: Secondary | ICD-10-CM | POA: Diagnosis present

## 2020-11-23 LAB — D-DIMER, QUANTITATIVE: D-Dimer, Quant: 0.37 ug/mL-FEU (ref 0.00–0.50)

## 2020-11-23 LAB — HEPATIC FUNCTION PANEL
ALT: 19 U/L (ref 0–44)
AST: 21 U/L (ref 15–41)
Albumin: 4.2 g/dL (ref 3.5–5.0)
Alkaline Phosphatase: 67 U/L (ref 38–126)
Bilirubin, Direct: 0.1 mg/dL (ref 0.0–0.2)
Indirect Bilirubin: 1.1 mg/dL — ABNORMAL HIGH (ref 0.3–0.9)
Total Bilirubin: 1.2 mg/dL (ref 0.3–1.2)
Total Protein: 8 g/dL (ref 6.5–8.1)

## 2020-11-23 LAB — RESP PANEL BY RT-PCR (FLU A&B, COVID) ARPGX2
Influenza A by PCR: NEGATIVE
Influenza B by PCR: NEGATIVE
SARS Coronavirus 2 by RT PCR: NEGATIVE

## 2020-11-23 LAB — URINALYSIS, COMPLETE (UACMP) WITH MICROSCOPIC
Bilirubin Urine: NEGATIVE
Glucose, UA: NEGATIVE mg/dL
Hgb urine dipstick: NEGATIVE
Ketones, ur: NEGATIVE mg/dL
Leukocytes,Ua: NEGATIVE
Nitrite: NEGATIVE
Protein, ur: NEGATIVE mg/dL
Specific Gravity, Urine: 1.008 (ref 1.005–1.030)
pH: 5 (ref 5.0–8.0)

## 2020-11-23 LAB — LIPID PANEL
Cholesterol: 185 mg/dL (ref 0–200)
HDL: 19 mg/dL — ABNORMAL LOW (ref >40)
LDL Cholesterol: UNDETERMINED mg/dL (ref 0–99)
Total CHOL/HDL Ratio: 9.7 RATIO
Triglycerides: 900 mg/dL — ABNORMAL HIGH (ref <150)
VLDL: UNDETERMINED mg/dL (ref 0–40)

## 2020-11-23 LAB — CBC
HCT: 53.1 % — ABNORMAL HIGH (ref 39.0–52.0)
Hemoglobin: 17.7 g/dL — ABNORMAL HIGH (ref 13.0–17.0)
MCH: 27 pg (ref 26.0–34.0)
MCHC: 33.3 g/dL (ref 30.0–36.0)
MCV: 80.9 fL (ref 80.0–100.0)
Platelets: 223 10*3/uL (ref 150–400)
RBC: 6.56 MIL/uL — ABNORMAL HIGH (ref 4.22–5.81)
RDW: 13.8 % (ref 11.5–15.5)
WBC: 10.2 10*3/uL (ref 4.0–10.5)
nRBC: 0.2 % (ref 0.0–0.2)

## 2020-11-23 LAB — PROCALCITONIN: Procalcitonin: 0.21 ng/mL

## 2020-11-23 LAB — BASIC METABOLIC PANEL
Anion gap: 14 (ref 5–15)
BUN: 50 mg/dL — ABNORMAL HIGH (ref 6–20)
CO2: 26 mmol/L (ref 22–32)
Calcium: 10.5 mg/dL — ABNORMAL HIGH (ref 8.9–10.3)
Chloride: 94 mmol/L — ABNORMAL LOW (ref 98–111)
Creatinine, Ser: 2.3 mg/dL — ABNORMAL HIGH (ref 0.61–1.24)
GFR, Estimated: 34 mL/min — ABNORMAL LOW (ref >60)
Glucose, Bld: 300 mg/dL — ABNORMAL HIGH (ref 70–99)
Potassium: 3.7 mmol/L (ref 3.5–5.1)
Sodium: 134 mmol/L — ABNORMAL LOW (ref 135–145)

## 2020-11-23 LAB — GLUCOSE, CAPILLARY: Glucose-Capillary: 327 mg/dL — ABNORMAL HIGH (ref 70–99)

## 2020-11-23 LAB — LIPASE, BLOOD: Lipase: 36 U/L (ref 11–51)

## 2020-11-23 LAB — TSH: TSH: 1.211 u[IU]/mL (ref 0.350–4.500)

## 2020-11-23 LAB — BRAIN NATRIURETIC PEPTIDE: B Natriuretic Peptide: 92.9 pg/mL (ref 0.0–100.0)

## 2020-11-23 LAB — TROPONIN I (HIGH SENSITIVITY)
Troponin I (High Sensitivity): 52 ng/L — ABNORMAL HIGH (ref <18)
Troponin I (High Sensitivity): 42 ng/L — ABNORMAL HIGH (ref <18)

## 2020-11-23 MED ORDER — INSULIN ASPART 100 UNIT/ML ~~LOC~~ SOLN
0.0000 [IU] | Freq: Three times a day (TID) | SUBCUTANEOUS | Status: DC
Start: 2020-11-24 — End: 2020-11-27
  Administered 2020-11-24: 5 [IU] via SUBCUTANEOUS
  Administered 2020-11-24: 2 [IU] via SUBCUTANEOUS
  Administered 2020-11-24: 3 [IU] via SUBCUTANEOUS
  Administered 2020-11-25 (×2): 2 [IU] via SUBCUTANEOUS
  Administered 2020-11-25: 3 [IU] via SUBCUTANEOUS
  Administered 2020-11-26: 5 [IU] via SUBCUTANEOUS
  Administered 2020-11-26 – 2020-11-27 (×3): 2 [IU] via SUBCUTANEOUS
  Filled 2020-11-23 (×10): qty 1

## 2020-11-23 MED ORDER — ONDANSETRON HCL 4 MG PO TABS: 4.0000 mg | ORAL_TABLET | Freq: Four times a day (QID) | ORAL | Status: DC | PRN

## 2020-11-23 MED ORDER — LORAZEPAM 2 MG/ML IJ SOLN
0.5000 mg | Freq: Once | INTRAMUSCULAR | Status: AC
  Administered 2020-11-23: 0.5 mg via INTRAVENOUS
  Filled 2020-11-23: qty 1

## 2020-11-23 MED ORDER — ACETAMINOPHEN 650 MG RE SUPP: 650.0000 mg | Freq: Four times a day (QID) | RECTAL | Status: AC | PRN

## 2020-11-23 MED ORDER — FENOFIBRATE 160 MG PO TABS
160.0000 mg | ORAL_TABLET | Freq: Every day | ORAL | Status: DC
  Administered 2020-11-23 – 2020-11-27 (×5): 160 mg via ORAL
  Filled 2020-11-23 (×7): qty 1

## 2020-11-23 MED ORDER — NITROGLYCERIN 0.4 MG SL SUBL
0.4000 mg | SUBLINGUAL_TABLET | SUBLINGUAL | Status: AC | PRN
Start: 2020-11-23 — End: 2020-11-26
  Administered 2020-11-26 (×3): 0.4 mg via SUBLINGUAL
  Filled 2020-11-23 (×3): qty 1

## 2020-11-23 MED ORDER — IPRATROPIUM-ALBUTEROL 20-100 MCG/ACT IN AERS
1.0000 | INHALATION_SPRAY | Freq: Four times a day (QID) | RESPIRATORY_TRACT | Status: DC
  Administered 2020-11-23 – 2020-11-27 (×13): 1 via RESPIRATORY_TRACT
  Filled 2020-11-23 (×2): qty 4

## 2020-11-23 MED ORDER — ROSUVASTATIN CALCIUM 10 MG PO TABS
20.0000 mg | ORAL_TABLET | Freq: Every day | ORAL | Status: DC
  Administered 2020-11-23 – 2020-11-26 (×4): 20 mg via ORAL
  Filled 2020-11-23 (×4): qty 2
  Filled 2020-11-23: qty 1

## 2020-11-23 MED ORDER — RIVAROXABAN 20 MG PO TABS
20.0000 mg | ORAL_TABLET | Freq: Every day | ORAL | Status: DC
  Administered 2020-11-23 – 2020-11-26 (×4): 20 mg via ORAL
  Filled 2020-11-23 (×6): qty 1

## 2020-11-23 MED ORDER — INSULIN ASPART 100 UNIT/ML ~~LOC~~ SOLN
0.0000 [IU] | Freq: Every day | SUBCUTANEOUS | Status: DC
  Administered 2020-11-23: 4 [IU] via SUBCUTANEOUS
  Filled 2020-11-23 (×2): qty 1

## 2020-11-23 MED ORDER — ACETAMINOPHEN 500 MG PO TABS: 1000.0000 mg | ORAL_TABLET | Freq: Four times a day (QID) | ORAL | Status: AC | PRN

## 2020-11-23 MED ORDER — SPIRONOLACTONE 25 MG PO TABS: 25.0000 mg | ORAL_TABLET | Freq: Two times a day (BID) | ORAL | Status: DC

## 2020-11-23 MED ORDER — CARVEDILOL 6.25 MG PO TABS
6.2500 mg | ORAL_TABLET | Freq: Two times a day (BID) | ORAL | Status: DC
  Administered 2020-11-24 – 2020-11-27 (×7): 6.25 mg via ORAL
  Filled 2020-11-23 (×7): qty 1

## 2020-11-23 MED ORDER — SODIUM CHLORIDE 0.9 % IV BOLUS
500.0000 mL | Freq: Once | INTRAVENOUS | Status: AC
  Administered 2020-11-23: 500 mL via INTRAVENOUS

## 2020-11-23 MED ORDER — ALBUTEROL SULFATE (2.5 MG/3ML) 0.083% IN NEBU: 2.5000 mg | INHALATION_SOLUTION | RESPIRATORY_TRACT | Status: AC | PRN

## 2020-11-23 MED ORDER — MELATONIN 5 MG PO TABS: 5.0000 mg | ORAL_TABLET | Freq: Every evening | ORAL | Status: DC | PRN

## 2020-11-23 MED ORDER — ONDANSETRON HCL 4 MG/2ML IJ SOLN: 4.0000 mg | Freq: Four times a day (QID) | INTRAMUSCULAR | Status: DC | PRN

## 2020-11-23 MED ORDER — INSULIN GLARGINE 100 UNIT/ML ~~LOC~~ SOLN
28.0000 [IU] | Freq: Every day | SUBCUTANEOUS | Status: DC
  Administered 2020-11-23: 28 [IU] via SUBCUTANEOUS
  Filled 2020-11-23 (×3): qty 0.28

## 2020-11-23 NOTE — H&P (Signed)
History and Physical   Gabriel [name]. VPX:[id number] DOB: 1971-05-26 DOA: 11/23/2020  PCP: Virginia Crews, MD  Outpatient Specialists: [name] Patient coming from: home   I have personally briefly reviewed patient's old medical records in [place].  Chief Concern: Shortness of breath  HPI: Gabriel [name]. is a 50 y.o. male with medical history significant for insulin-dependent diabetes mellitus, hypertension, truncal obesity, OSA was recently diagnosed and has not received CPAP yet, heart failure reduced ejection fraction, hyperlipidemia, likely metabolic syndrome, presents emergency department for chief concerns of shortness of breath.  Gabriel [name] states that his shortness of breath started on 11/22/20.  He states that he was standing and at work while this happened.  He states that when he lays down it does not improve his shortness of breath.  He also endorses dizziness.  He presented to the emergency department on 11/22/2020 and was told to decrease his torsemide from 60 mg/day to 40 mg/day.  He endorses light chest pressure that has been persistent since 2016.  He denies bilateral lower extremity swelling.  He endorses decreased urination for the last 2 to 3 days despite taking his torsemide.  He denies fever, new cough, dysuria, diarrhea, acute vision changes.  He denies bilateral upper extremity weakness, numbness, tingling.  He denies changes to back pain.  He does endorse gradual worsening bilateral vision blurriness and states that he has not been to his eye doctor in a long time due to financial difficulty and insurance problems.  Note: [name] is cardiologist, and patient's next appointment with [name] is next Thursday, for 11/30/2020. Marland Kitchen   Social history: formerly drank two 1/5th per day, last drink was in 2010. He denies recreational drugs. He formerly smoked 2 ppds (started age 70) and quit smoking September 2021. He lives with his father. He works part  time at [employer].  Vaccination: he is vaccinated for covid-19, two doses   ROS: Constitutional: no weight change, no fever ENT/Mouth: no sore throat, no rhinorrhea Eyes: no eye pain, no vision changes Cardiovascular: no chest pain, + dyspnea,  no edema, no palpitations Respiratory: no cough, no sputum, no wheezing Gastrointestinal: no nausea, no vomiting, no diarrhea, no constipation Genitourinary: no urinary incontinence, no dysuria, no hematuria Musculoskeletal: no arthralgias, no myalgias Skin: no skin lesions, no pruritus, Neuro: + weakness, no loss of consciousness, no syncope Psych: no anxiety, no depression, + decrease appetite Heme/Lymph: no bruising, no bleeding  ED Course: Discussed with ED provider, patient requiring hospitalization due to acute kidney injury and shortness of breath.  Vitals in the emergency department was remarkable for temperature of 97.5, respiration rate of 17, heart rate 80, blood pressure 104/88, patient SPO2 at 93% on 2 L nasal cannula.  Labs in the emergency department was remarkable for serum creatinine of 2.30, EGFR 34, nonfasting blood glucose of 300.  Remainder of the labs revealed sodium of 134, potassium 3.7, chloride 94, bicarb 26, BUN 50, WBC 10.2, hemoglobin 17.2, platelets 223.  ED provider ordered portable chest x-ray 1 view which was read as lungs clear, heart borderline enlarged.  Patient was given normal saline 500 mL bolus per ED provider.  Assessment/Plan  Principal Problem:   Acute kidney injury ([place]) Active Problems:   Essential hypertension   Hyperlipidemia   COPD exacerbation (HCC)   Atypical chest pain   Pulmonary embolism on right (HCC)   Hyperglycemia   OSA on CPAP   Acute kidney injury-etiology work-up in  progress, however suspect secondary to overdiuresis and poor p.o. intake -Holding metolazone and torsemide and Entresto and spironolactone  -Status post normal saline 500 mL bolus per ED provider, and given  heart failure reduced ejection fraction, monitor closely as I do not want to fluid overload patient -BMP in the a.m.  Acute shortness of breath-patient reports unchanged with position or activity -Query secondary to acute kidney injury from overdiuresis and dehydration -BMP in a.m. -Checking D-dimer, given the understanding that patient has been compliant with Xarelto and has not missed any dose -Continue to follow troponin -Checking TSH, BNP  History of heart failure reduced ejection fraction-resumed carvedilol 6.25 mg p.o. twice daily -Holding Entresto, metolazone, torsemide  Hyperlipidemia-resumed fenofibrate and rosuvastatin  History of right lower lobe pulmonary emboli-resume Xarelto  Obstructive sleep apnea-CPAP nightly ordered  Insulin-dependent diabetes mellitus-resumed home long-acting insulin dose of 28 units nightly -Insulin SSI, sensitive renal, with at bedtime coverage  Metabolic syndrome  Lab was discontinued due to hyperlipidemic  Lipid panel was ordered, lipase  Chart reviewed.   DVT prophylaxis: Xarelto Code Status: Full code Diet: Heart healthy/carb modified Family Communication: No Disposition Plan: Pending clinical course Consults called: None at this time Admission status: MedSurg, observation, with telemetry  Past Medical History:  Diagnosis Date  . Chest wall pain, chronic   . Chronic systolic CHF (congestive heart failure) ([place])   . Chronic Troponin Elevation   . CKD (chronic kidney disease), stage II-III   . COPD (chronic obstructive pulmonary disease) ([place])   . Diabetes mellitus without complication ([place])   . Hypertension    Resolved since weight loss  . Myocardial infarct ([place])   . NICM (nonischemic cardiomyopathy) ([place])    a. 03/2015 Echo: EF 45-50%; b. 12/2015 Echo: EF 20-25%; c. 02/2016 Echo: EF 30-35%; d. 11/2016 Echo: EF 40-45%; e. 06/2019 Echo: EF 30-35%; f. 11/2019 Echo: EF 25-30%, glob HK. Mild LVH. Gr2 DD. Mild-mod AI. BAE. Triv MR.  .  Non-obstructive CAD (coronary artery disease)    a. 04/2015 low risk MV;  b. 12/2016 Cath: minor irregs in LAD/Diag/LCX/OM, RCA 40p/m/d.  [name] (nonsustained ventricular tachycardia) ([place])    a. 12/2015 noted on tele-->amio;  b. 12/2015 Event monitor: no VT. Atach noted.  . Obesity (BMI 30.0-34.9)   . Psoriasis   . Syncope    a. 01/2016 - felt to be vasovagal.  . Tobacco abuse    Past Surgical History:  Procedure Laterality Date  . AMPUTATION    . CARDIAC CATHETERIZATION    . FINGER AMPUTATION     Traumatic  . FINGER FRACTURE SURGERY Left   . LEFT HEART CATH AND CORONARY ANGIOGRAPHY N/A 01/06/2017   Procedure: Left Heart Cath and Coronary Angiography;  Surgeon: Wellington Hampshire, MD;  Location: [facility];  Service: Cardiovascular;  Laterality: N/A;  . RIGHT/LEFT HEART CATH AND CORONARY ANGIOGRAPHY N/A 06/13/2020   Procedure: RIGHT/LEFT HEART CATH AND CORONARY ANGIOGRAPHY;  Surgeon: Nelva Bush, MD;  Location: [facility];  Service: Cardiovascular;  Laterality: N/A;   Social History:  reports that he quit smoking about 6 months ago. His smoking use included cigarettes. He has a 16.50 pack-year smoking history. He has quit using smokeless tobacco. He reports current alcohol use. He reports that he does not use drugs.  Allergies  Allergen Reactions  . Metformin And Related Nausea And Vomiting  . Prednisone Other (See Comments)    Reaction: Hallucinations    . Zantac [Ranitidine Hcl] Diarrhea and Nausea Only  Night sweats   Family History  Problem Relation Age of Onset  . Diabetes Mellitus II Mother   . Hypothyroidism Mother   . Hypertension Mother   . Hypertension Father   . Gout Father   . Cancer Paternal Aunt   . Cancer Maternal Grandfather   . Diabetes Maternal Grandfather    Family history: Family history reviewed and not pertinent  Prior to Admission medications   Medication Sig Start Date End Date Taking? Authorizing Provider  B-D UF III MINI  PEN NEEDLES 31G X 5 MM MISC 1 DOSE BY DOES NOT APPLY ROUTE 4 (FOUR) TIMES DAILY - WITH MEALS AND AT BEDTIME. 06/12/20   Lorella Nimrod, MD  blood glucose meter kit and supplies KIT Dispense based on patient and insurance preference. Use up to four times daily as directed. (FOR ICD-9 250.00, 250.01). 06/09/20   Lorella Nimrod, MD  carvedilol (COREG) 6.25 MG tablet Take 1 tablet (6.25 mg total) by mouth 2 (two) times daily with a meal. 08/02/20   Larey Dresser, MD  COMBIVENT RESPIMAT 20-100 MCG/ACT AERS respimat Inhale 1 puff into the lungs 4 (four) times daily. 10/26/20   [provider]  fenofibrate (TRICOR) 145 MG tablet Take 1 tablet (145 mg total) by mouth daily. 06/09/20   Lorella Nimrod, MD  Insulin Glargine (BASAGLAR KWIKPEN) 100 UNIT/ML Inject 28 Units into the skin at bedtime. 06/09/20   Lorella Nimrod, MD  metolazone (ZAROXOLYN) 2.5 MG tablet Take 1 tablet (2.5 mg total) by mouth daily as needed (sob,leg edema). 10/29/20   Fritzi Mandes, MD  nitroGLYCERIN (NITROSTAT) 0.4 MG SL tablet Place 1 tablet (0.4 mg total) under the tongue every 5 (five) minutes x 3 doses as needed for chest pain. 06/09/20   Lorella Nimrod, MD  rivaroxaban (XARELTO) 20 MG TABS tablet Take 1 tablet (20 mg total) by mouth daily with supper. 11/07/20   Cammie Sickle, MD  rosuvastatin (CRESTOR) 20 MG tablet Take 1 tablet (20 mg total) by mouth at bedtime. 06/09/20   Lorella Nimrod, MD  sacubitril-valsartan (ENTRESTO) 49-51 MG Take 1 tablet by mouth 2 (two) times daily. 08/02/20   Larey Dresser, MD  spironolactone (ALDACTONE) 25 MG tablet Take 1 tablet (25 mg total) by mouth 2 (two) times daily. 08/02/20   Larey Dresser, MD  torsemide (DEMADEX) 20 MG tablet Take 3 tablets (60 mg total) by mouth daily. 10/28/20   Fritzi Mandes, MD  torsemide 60 MG TABS Take 60 mg by mouth daily as needed. For increasing SOB and weight again (this is extra from your daily 60 mg dosing) 10/28/20   Fritzi Mandes, MD  TRULICITY 9.79  GX/2.1JH SOPN INJECT 0.75 MG INTO THE SKIN ONCE A WEEK. 09/28/20   Trinna Post, PA-C   Physical Exam: Vitals:   11/23/20 1645 11/23/20 1700 11/23/20 1730 11/23/20 1800  BP:  104/88 128/75 (!) 145/82  Pulse:  84 77 80  Resp: _0 (!) 21  Temp:      TempSrc:      SpO2:  95% 93% 92%  Weight:      Height:       Constitutional: appears older than chronological age, NAD Eyes: PERRL, lids and conjunctivae normal ENMT: Mucous membranes are moist. Posterior pharynx clear of any exudate or lesions. Age-appropriate dentition. Hearing appropriate Neck: normal, supple, no masses, no thyromegaly Respiratory: clear to auscultation bilaterally, no wheezing, no crackles. Normal respiratory effort. No accessory muscle use.  Cardiovascular: Regular rate and rhythm,  no murmurs / rubs / gallops. No extremity edema. 2+ pedal pulses. No carotid bruits.  Abdomen: no tenderness, no masses palpated, no hepatosplenomegaly. Bowel sounds positive.  Musculoskeletal: no clubbing / cyanosis. No joint deformity upper and lower extremities. Good ROM, no contractures, no atrophy. Normal muscle tone.  Skin: no rashes, lesions, ulcers. No induration Neurologic: Sensation intact. Strength 5/5 in all 4.  Psychiatric: Normal judgment and insight. Alert and oriented x 3. Normal mood.   EKG: independently reviewed, showing sinus rhythm with rate of 90, occasional PVCs, QTc 457  Chest x-ray on Admission: I personally reviewed and there is cardiomegaly and bilateral pulmonary vascular congestion relatively unchanged from previous chest x-ray.  No consolidation.  DG Chest Portable 1 View  Result Date: 11/23/2020 CLINICAL DATA:  Cough EXAM: PORTABLE CHEST 1 VIEW COMPARISON:  October 27, 2020 FINDINGS: Lungs are clear. Heart is borderline enlarged with pulmonary vascularity normal. No adenopathy. No bone lesions. IMPRESSION: Lungs clear.  Heart borderline enlarged. Electronically Signed   By: Lowella Grip III M.D.   On:  11/23/2020 14:33   Labs on Admission: I have personally reviewed following labs  CBC: Recent Labs  Lab 11/22/20 1300 11/23/20 1345  WBC 10.2 10.2  HGB 17.6* 17.7*  HCT 51.9 53.1*  MCV 82.1 80.9  PLT 204 292   Basic Metabolic Panel: Recent Labs  Lab 11/22/20 1300 11/23/20 1345  NA 138 134*  K 3.8 3.7  CL 96* 94*  CO2 28 26  GLUCOSE 297* 300*  BUN 51* 50*  CREATININE 2.44* 2.30*  CALCIUM 8.9 10.5*   GFR: Estimated Creatinine Clearance: 47.7 mL/min (A) (by C-G formula based on SCr of 2.3 mg/dL (H)).  Urine analysis:    Component Value Date/Time   COLORURINE YELLOW (A) 11/23/2020 1441   APPEARANCEUR HAZY (A) 11/23/2020 1441   APPEARANCEUR Clear 03/08/2020 0951   LABSPEC 1.008 11/23/2020 1441   LABSPEC 1.019 11/12/2012 1713   PHURINE 5.0 11/23/2020 1441   GLUCOSEU NEGATIVE 11/23/2020 1441   GLUCOSEU Negative 11/12/2012 1713   HGBUR NEGATIVE 11/23/2020 1441   BILIRUBINUR NEGATIVE 11/23/2020 1441   BILIRUBINUR Negative 03/08/2020 0951   BILIRUBINUR Negative 11/12/2012 1713   KETONESUR NEGATIVE 11/23/2020 1441   PROTEINUR NEGATIVE 11/23/2020 1441   NITRITE NEGATIVE 11/23/2020 1441   LEUKOCYTESUR NEGATIVE 11/23/2020 1441   LEUKOCYTESUR Negative 11/12/2012 1713   Lenzy Kerschner N Thessaly Mccullers D.O. [facility]  If 7PM-7AM, please contact overnight-coverage provider If 7AM-7PM, please contact day coverage provider [email or web address]  11/23/2020, 6:35 PM

## 2020-11-23 NOTE — Telephone Encounter (Signed)
Spoke with patient and he is having dizziness, lightheaded, and just not feeling right. Inquired if he had been staying hydrated and he states that he has been drinking plenty but not going to the bathroom. He reports that he feels something is just wrong. His father is currently taking him to ED for evaluation. Advised that when he gets to ED if they feel it is cardiac in nature they will then consult with cardiology. I advised that I would update the team covering hospital if consult is needed. He verbalized understanding with no further questions.

## 2020-11-23 NOTE — ED Triage Notes (Signed)
Reports dizziness and weakness on standing. Was here yesterday for same with low BP. His torsemide was decreased yesterday; states he has only urinated twice today.alert and oriented X4.

## 2020-11-23 NOTE — Progress Notes (Signed)
   11/23/20 1850  Assess: MEWS Score  Temp 97.8 F (36.6 C)  BP 117/78  Pulse Rate 82  Resp (!) 30  SpO2 98 %  O2 Device Nasal Cannula  O2 Flow Rate (L/min) 2 L/min  Assess: MEWS Score  MEWS Temp 0  MEWS Systolic 0  MEWS Pulse 0  MEWS RR 2  MEWS LOC 0  MEWS Score 2  MEWS Score Color Yellow  Assess: if the MEWS score is Yellow or Red  Were vital signs taken at a resting state? Yes  Focused Assessment  (Initial admission assessment)  Early Detection of Sepsis Score *See Row Information* Low  MEWS guidelines implemented *See Row Information* Yes  Treat  MEWS Interventions Other (Comment) (Attending notified)  Pain Scale 0-10  Pain Score 0  Take Vital Signs  Increase Vital Sign Frequency  Yellow: Q 2hr X 2 then Q 4hr X 2, if remains yellow, continue Q 4hrs  Escalate  MEWS: Escalate Yellow: discuss with charge nurse/RN and consider discussing with provider and RRT  Notify: Charge Nurse/RN  Name of Charge Nurse/RN Notified Strayhorn, RN  Date Charge Nurse/RN Notified 11/23/20  Time Charge Nurse/RN Notified 1905  Notify: Provider  Provider Name/Title Cox, DO  Date Provider Notified 11/23/20  Time Provider Notified 1905  Notification Type Page  Notification Reason Other (Comment) (RR 30)  Provider response En route  Date of Provider Response 11/23/20  Time of Provider Response 1910   0.5mg  lorazepam IV ordered by Cox, DO. Given by [facility], RN during bedside report.

## 2020-11-23 NOTE — ED Provider Notes (Signed)
[facility] Emergency Department Provider Note  Time seen: 2:00 PM  I have reviewed the triage vital signs and the nursing notes.   HISTORY  Chief Complaint Weakness   HPI Gabriel [name]. is a 50 y.o. male with a past medical history of CHF, CKD, COPD, presents to the emergency department for weakness/lightheadedness.  According to the patient for the past 2 days or so he has been feeling very weak and lightheaded especially upon standing.  Patient was seen in the emergency department yesterday for the same was given some IV fluids and felt better.  Today patient states while standing at work he began feeling very dizzy and lightheaded once again.  Upon arrival to the emergency department patient is borderline hypotensive with a systolic in the 16P.  Currently 117/78 without intervention.  Patient states he has had a mild cough denies any shortness of breath or chest pain.  No abdominal pain nausea vomiting or diarrhea.  Patient's torsemide was decreased yesterday.  Past Medical History:  Diagnosis Date  . Chest wall pain, chronic   . Chronic systolic CHF (congestive heart failure) ([place])   . Chronic Troponin Elevation   . CKD (chronic kidney disease), stage II-III   . COPD (chronic obstructive pulmonary disease) ([place])   . Diabetes mellitus without complication ([place])   . Hypertension    Resolved since weight loss  . Myocardial infarct ([place])   . NICM (nonischemic cardiomyopathy) ([place])    a. 03/2015 Echo: EF 45-50%; b. 12/2015 Echo: EF 20-25%; c. 02/2016 Echo: EF 30-35%; d. 11/2016 Echo: EF 40-45%; e. 06/2019 Echo: EF 30-35%; f. 11/2019 Echo: EF 25-30%, glob HK. Mild LVH. Gr2 DD. Mild-mod AI. BAE. Triv MR.  . Non-obstructive CAD (coronary artery disease)    a. 04/2015 low risk MV;  b. 12/2016 Cath: minor irregs in LAD/Diag/LCX/OM, RCA 40p/m/d.  [name] (nonsustained ventricular tachycardia) ([place])    a. 12/2015 noted on tele-->amio;  b. 12/2015 Event monitor: no VT. Atach  noted.  . Obesity (BMI 30.0-34.9)   . Psoriasis   . Syncope    a. 01/2016 - felt to be vasovagal.  . Tobacco abuse     Patient Active Problem List   Diagnosis Date Noted  . Chest pain 10/27/2020  . Snoring 08/15/2020  . Type II diabetes mellitus with renal manifestations ([place]) 06/11/2020  . COPD (chronic obstructive pulmonary disease) ([place]) 06/11/2020  . Leukocytosis 06/11/2020  . PE (pulmonary thromboembolism) ([place]) 06/11/2020  . Hyperglycemia   . Elevated brain natriuretic peptide (BNP) level   . Uncontrolled type 2 diabetes mellitus with hyperglycemia, with long-term current use of insulin ([place]) 05/13/2020  . Acute on chronic HFrEF (heart failure with reduced ejection fraction) ([place]) 05/12/2020  . Erectile dysfunction due to arterial insufficiency 03/15/2020  . Screening PSA (prostate specific antigen) 03/15/2020  . Erectile dysfunction 03/02/2020  . Acute pulmonary embolism ([place]) 01/03/2020  . Pulmonary embolism on right ([place])   . 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 Hemoptysis   . Costochondritis   . Acute respiratory failure with hypoxia ([place])   . Acute on chronic combined systolic (congestive) and diastolic (congestive) heart failure ([place]) 12/20/2019  . Acute on chronic heart failure ([place]) 11/22/2019  . CKD (chronic kidney disease), stage IIIa 06/26/2019  . Type 2 diabetes mellitus with hyperlipidemia ([place]) 06/26/2019  . Hypertensive urgency 06/26/2019  . Acute on chronic combined systolic and diastolic CHF (congestive heart failure) ([place]) 09/02/2017  . Other chest pain 08/24/2017  .  Dizziness   . Noncompliance with medications 04/29/2017  . Back pain 03/06/2017  . Tobacco use 12/04/2016  . Gallbladder sludge 12/04/2016  . Acute on chronic systolic CHF (congestive heart failure) ([place]) 11/28/2016  . Coronary artery disease, non-occlusive 03/15/2016  . Atypical chest pain 02/16/2016  . Orthostatic hypotension 01/23/2016  . Hypomagnesemia 01/23/2016  . Chronic  systolic CHF (congestive heart failure) ([place]) 01/23/2016  . NICM (nonischemic cardiomyopathy) ([place]) 01/17/2016  . NSVT (nonsustained ventricular tachycardia) ([place])   . Troponin I above reference range   . Hyperlipidemia 05/17/2015  . COPD exacerbation ([place]) 05/17/2015  . Essential hypertension 03/31/2015  . Elevated troponin 03/20/2015    Past Surgical History:  Procedure Laterality Date  . AMPUTATION    . CARDIAC CATHETERIZATION    . FINGER AMPUTATION     Traumatic  . FINGER FRACTURE SURGERY Left   . LEFT HEART CATH AND CORONARY ANGIOGRAPHY N/A 01/06/2017   Procedure: Left Heart Cath and Coronary Angiography;  Surgeon: Wellington Hampshire, MD;  Location: [facility];  Service: Cardiovascular;  Laterality: N/A;  . RIGHT/LEFT HEART CATH AND CORONARY ANGIOGRAPHY N/A 06/13/2020   Procedure: RIGHT/LEFT HEART CATH AND CORONARY ANGIOGRAPHY;  Surgeon: Nelva Bush, MD;  Location: [facility];  Service: Cardiovascular;  Laterality: N/A;    Prior to Admission medications   Medication Sig Start Date End Date Taking? Authorizing Provider  B-D UF III MINI PEN NEEDLES 31G X 5 MM MISC 1 DOSE BY DOES NOT APPLY ROUTE 4 (FOUR) TIMES DAILY - WITH MEALS AND AT BEDTIME. 06/12/20   Lorella Nimrod, MD  blood glucose meter kit and supplies KIT Dispense based on patient and insurance preference. Use up to four times daily as directed. (FOR ICD-9 250.00, 250.01). 06/09/20   Lorella Nimrod, MD  carvedilol (COREG) 6.25 MG tablet Take 1 tablet (6.25 mg total) by mouth 2 (two) times daily with a meal. 08/02/20   Larey Dresser, MD  COMBIVENT RESPIMAT 20-100 MCG/ACT AERS respimat Inhale 1 puff into the lungs 4 (four) times daily. 10/26/20   [provider]  fenofibrate (TRICOR) 145 MG tablet Take 1 tablet (145 mg total) by mouth daily. 06/09/20   Lorella Nimrod, MD  Insulin Glargine (BASAGLAR KWIKPEN) 100 UNIT/ML Inject 28 Units into the skin at bedtime. 06/09/20   Lorella Nimrod, MD   metolazone (ZAROXOLYN) 2.5 MG tablet Take 1 tablet (2.5 mg total) by mouth daily as needed (sob,leg edema). 10/29/20   Fritzi Mandes, MD  nitroGLYCERIN (NITROSTAT) 0.4 MG SL tablet Place 1 tablet (0.4 mg total) under the tongue every 5 (five) minutes x 3 doses as needed for chest pain. 06/09/20   Lorella Nimrod, MD  rivaroxaban (XARELTO) 20 MG TABS tablet Take 1 tablet (20 mg total) by mouth daily with supper. 11/07/20   Cammie Sickle, MD  rosuvastatin (CRESTOR) 20 MG tablet Take 1 tablet (20 mg total) by mouth at bedtime. 06/09/20   Lorella Nimrod, MD  sacubitril-valsartan (ENTRESTO) 49-51 MG Take 1 tablet by mouth 2 (two) times daily. 08/02/20   Larey Dresser, MD  spironolactone (ALDACTONE) 25 MG tablet Take 1 tablet (25 mg total) by mouth 2 (two) times daily. 08/02/20   Larey Dresser, MD  torsemide (DEMADEX) 20 MG tablet Take 3 tablets (60 mg total) by mouth daily. 10/28/20   Fritzi Mandes, MD  torsemide 60 MG TABS Take 60 mg by mouth daily as needed. For increasing SOB and weight again (this is extra from your daily 60  mg dosing) 10/28/20   Fritzi Mandes, MD  TRULICITY 4.56 YB/6.3SL SOPN INJECT 0.75 MG INTO THE SKIN ONCE A WEEK. 09/28/20   Trinna Post, PA-C    Allergies  Allergen Reactions  . Metformin And Related Nausea And Vomiting  . Prednisone Other (See Comments)    Reaction: Hallucinations    . Zantac [Ranitidine Hcl] Diarrhea and Nausea Only    Night sweats    Family History  Problem Relation Age of Onset  . Diabetes Mellitus II Mother   . Hypothyroidism Mother   . Hypertension Mother   . Hypertension Father   . Gout Father   . Cancer Paternal Aunt   . Cancer Maternal Grandfather   . Diabetes Maternal Grandfather     Social History Social History   Tobacco Use  . Smoking status: Former Smoker    Packs/day: 0.50    Years: 33.00    Pack years: 16.50    Types: Cigarettes    Quit date: 05/08/2020    Years since quitting: 0.5  . Smokeless tobacco: Former Engineer, maintenance (IT)  . Vaping Use: Former  Substance Use Topics  . Alcohol use: Yes    Alcohol/week: 0.0 standard drinks    Comment: occassionally  . Drug use: No    Review of Systems Constitutional: Negative for fever.  Generalized weakness/lightheadedness specially upon standing. Cardiovascular: Negative for chest pain. Respiratory: Negative for shortness of breath. Gastrointestinal: Negative for abdominal pain, vomiting and diarrhea. Genitourinary: Negative for urinary compaints Musculoskeletal: Negative for musculoskeletal complaints Skin: Negative for skin complaints  Neurological: Negative for headache All other ROS negative  ____________________________________________   PHYSICAL EXAM:  VITAL SIGNS: ED Triage Vitals  Enc Vitals Group     BP 11/23/20 1339 94/65     Pulse Rate 11/23/20 1339 91     Resp 11/23/20 1339 16     Temp 11/23/20 1339 (!) 97.5 F (36.4 C)     Temp Source 11/23/20 1339 Oral     SpO2 11/23/20 1339 99 %     Weight 11/23/20 1340 233 lb (105.7 kg)     Height 11/23/20 1340 6' (1.829 m)     Head Circumference --      Peak Flow --      Pain Score 11/23/20 1339 6     Pain Loc --      Pain Edu? --      Excl. in [place]? --     Constitutional: Alert and oriented. Well appearing and in no distress. Eyes: Normal exam ENT      Head: Normocephalic and atraumatic.      Mouth/Throat: Mucous membranes are moist. Cardiovascular: Normal rate, regular rhythm.  Respiratory: Normal respiratory effort without tachypnea nor retractions. Breath sounds are clear Gastrointestinal: Soft and nontender. No distention.   Musculoskeletal: Nontender with normal range of motion in all extremities.  Neurologic:  Normal speech and language. No gross focal neurologic deficits  Skin:  Skin is warm, dry and intact.  Psychiatric: Mood and affect are normal.   ____________________________________________    EKG  EKG viewed and interpreted by myself shows sinus rhythm at 90 bpm  with a narrow QRS, normal axis, largely normal intervals and nonspecific ST changes especially in the inferolateral leads.  EKG unchanged from yesterday.  ____________________________________________    RADIOLOGY  's are clear.  ____________________________________________   INITIAL IMPRESSION / ASSESSMENT AND PLAN / ED COURSE  Pertinent labs & imaging results that were available during my care of  the patient were reviewed by me and considered in my medical decision making (see chart for details).   Patient presents to the emergency department for continued weakness/dizziness/lightheadedness.  Patient is mildly hypotensive upon arrival however improved to 117/78 without intervention.  He reviewed the patient's work-up from yesterday he appears to have moderate renal insufficiency compared to his baseline which could be the cause of the patient's intermittent hypotension and generalized fatigue/weakness.  We will check labs to evaluate kidney function today.  We will also evaluate for infectious etiology given the patient's cough and weakness.  Will obtain chest x-ray, urine sample.  We will also obtain a Covid swab.  Patient agreeable to plan of care.  Patient's blood work shows acute renal insufficiency otherwise no significant abnormalities.  Hemoglobin is elevated again consistent more with dehydration.  Anion gap of 14 and again consistent with dehydration.  Patient received IV fluid blood pressure currently 94/70.  Patient continues to feel very weak and dizzy.  We will admit to the hospital service for the patient's acute renal insufficiency intermittent hypotension and generalized weakness.  Patient agreeable to plan of care.  Gabriel [name]. was evaluated in Emergency Department on 11/23/2020 for the symptoms described in the history of present illness. He was evaluated in the context of the global COVID-19 pandemic, which necessitated consideration that the patient might be at risk for  infection with the SARS-CoV-2 virus that causes COVID-19. Institutional protocols and algorithms that pertain to the evaluation of patients at risk for COVID-19 are in a state of rapid change based on information released by regulatory bodies including the CDC and federal and state organizations. These policies and algorithms were followed during the patient's care in the ED.  ____________________________________________   FINAL CLINICAL IMPRESSION(S) / ED DIAGNOSES  Generalized weakness Renal insufficiency Hypotension   Harvest Dark, MD 11/23/20 1659

## 2020-11-23 NOTE — Telephone Encounter (Signed)
Patient states he was in the Emergency Department yesterday and is still experiencing some dizzines today.please call to discuss. STAT if patient feels like he/she is going to faint   1) Are you dizzy now? yes  2) Do you feel faint or have you passed out? yes  3) Do you have any other symptoms? Same symptoms as yesterday  Have you checked your HR and BP (record if available)? no

## 2020-11-24 DIAGNOSIS — E8881 Metabolic syndrome: Secondary | ICD-10-CM | POA: Diagnosis present

## 2020-11-24 DIAGNOSIS — E1122 Type 2 diabetes mellitus with diabetic chronic kidney disease: Secondary | ICD-10-CM | POA: Diagnosis present

## 2020-11-24 DIAGNOSIS — Z89022 Acquired absence of left finger(s): Secondary | ICD-10-CM | POA: Diagnosis not present

## 2020-11-24 DIAGNOSIS — I252 Old myocardial infarction: Secondary | ICD-10-CM | POA: Diagnosis not present

## 2020-11-24 DIAGNOSIS — I130 Hypertensive heart and chronic kidney disease with heart failure and stage 1 through stage 4 chronic kidney disease, or unspecified chronic kidney disease: Secondary | ICD-10-CM | POA: Diagnosis present

## 2020-11-24 DIAGNOSIS — I248 Other forms of acute ischemic heart disease: Secondary | ICD-10-CM | POA: Diagnosis present

## 2020-11-24 DIAGNOSIS — I472 Ventricular tachycardia: Secondary | ICD-10-CM | POA: Diagnosis present

## 2020-11-24 DIAGNOSIS — I5022 Chronic systolic (congestive) heart failure: Secondary | ICD-10-CM | POA: Diagnosis present

## 2020-11-24 DIAGNOSIS — E785 Hyperlipidemia, unspecified: Secondary | ICD-10-CM | POA: Diagnosis present

## 2020-11-24 DIAGNOSIS — Z79899 Other long term (current) drug therapy: Secondary | ICD-10-CM | POA: Diagnosis not present

## 2020-11-24 DIAGNOSIS — J441 Chronic obstructive pulmonary disease with (acute) exacerbation: Secondary | ICD-10-CM | POA: Diagnosis present

## 2020-11-24 DIAGNOSIS — Z20822 Contact with and (suspected) exposure to covid-19: Secondary | ICD-10-CM | POA: Diagnosis present

## 2020-11-24 DIAGNOSIS — Z794 Long term (current) use of insulin: Secondary | ICD-10-CM | POA: Diagnosis not present

## 2020-11-24 DIAGNOSIS — E669 Obesity, unspecified: Secondary | ICD-10-CM | POA: Diagnosis present

## 2020-11-24 DIAGNOSIS — E860 Dehydration: Secondary | ICD-10-CM | POA: Diagnosis present

## 2020-11-24 DIAGNOSIS — N179 Acute kidney failure, unspecified: Secondary | ICD-10-CM | POA: Diagnosis present

## 2020-11-24 DIAGNOSIS — Z833 Family history of diabetes mellitus: Secondary | ICD-10-CM | POA: Diagnosis not present

## 2020-11-24 DIAGNOSIS — Z888 Allergy status to other drugs, medicaments and biological substances status: Secondary | ICD-10-CM | POA: Diagnosis not present

## 2020-11-24 DIAGNOSIS — G4733 Obstructive sleep apnea (adult) (pediatric): Secondary | ICD-10-CM | POA: Diagnosis present

## 2020-11-24 DIAGNOSIS — I428 Other cardiomyopathies: Secondary | ICD-10-CM | POA: Diagnosis present

## 2020-11-24 DIAGNOSIS — N1831 Chronic kidney disease, stage 3a: Secondary | ICD-10-CM | POA: Diagnosis present

## 2020-11-24 DIAGNOSIS — Z7901 Long term (current) use of anticoagulants: Secondary | ICD-10-CM | POA: Diagnosis not present

## 2020-11-24 DIAGNOSIS — Z8249 Family history of ischemic heart disease and other diseases of the circulatory system: Secondary | ICD-10-CM | POA: Diagnosis not present

## 2020-11-24 DIAGNOSIS — I2782 Chronic pulmonary embolism: Secondary | ICD-10-CM | POA: Diagnosis present

## 2020-11-24 LAB — CBC
HCT: 51 % (ref 39.0–52.0)
Hemoglobin: 16.9 g/dL (ref 13.0–17.0)
MCH: 27 pg (ref 26.0–34.0)
MCHC: 33.1 g/dL (ref 30.0–36.0)
MCV: 81.6 fL (ref 80.0–100.0)
Platelets: 173 10*3/uL (ref 150–400)
RBC: 6.25 MIL/uL — ABNORMAL HIGH (ref 4.22–5.81)
RDW: 13.5 % (ref 11.5–15.5)
WBC: 8.4 10*3/uL (ref 4.0–10.5)
nRBC: 0 % (ref 0.0–0.2)

## 2020-11-24 LAB — BASIC METABOLIC PANEL
Anion gap: 15 (ref 5–15)
BUN: 50 mg/dL — ABNORMAL HIGH (ref 6–20)
CO2: 28 mmol/L (ref 22–32)
Calcium: 11.1 mg/dL — ABNORMAL HIGH (ref 8.9–10.3)
Chloride: 93 mmol/L — ABNORMAL LOW (ref 98–111)
Creatinine, Ser: 1.91 mg/dL — ABNORMAL HIGH (ref 0.61–1.24)
GFR, Estimated: 42 mL/min — ABNORMAL LOW (ref >60)
Glucose, Bld: 222 mg/dL — ABNORMAL HIGH (ref 70–99)
Potassium: 3.2 mmol/L — ABNORMAL LOW (ref 3.5–5.1)
Sodium: 136 mmol/L (ref 135–145)

## 2020-11-24 LAB — GLUCOSE, CAPILLARY
Glucose-Capillary: 241 mg/dL — ABNORMAL HIGH (ref 70–99)
Glucose-Capillary: 218 mg/dL — ABNORMAL HIGH (ref 70–99)
Glucose-Capillary: 265 mg/dL — ABNORMAL HIGH (ref 70–99)
Glucose-Capillary: 253 mg/dL — ABNORMAL HIGH (ref 70–99)
Glucose-Capillary: 182 mg/dL — ABNORMAL HIGH (ref 70–99)
Glucose-Capillary: 181 mg/dL — ABNORMAL HIGH (ref 70–99)

## 2020-11-24 LAB — LDL CHOLESTEROL, DIRECT: Direct LDL: 60.6 mg/dL (ref 0–99)

## 2020-11-24 LAB — CK: Total CK: 49 U/L (ref 49–397)

## 2020-11-24 MED ORDER — INSULIN ASPART 100 UNIT/ML ~~LOC~~ SOLN
3.0000 [IU] | Freq: Three times a day (TID) | SUBCUTANEOUS | Status: DC
  Administered 2020-11-24 – 2020-11-27 (×8): 3 [IU] via SUBCUTANEOUS
  Filled 2020-11-24 (×7): qty 1

## 2020-11-24 MED ORDER — INSULIN GLARGINE 100 UNIT/ML ~~LOC~~ SOLN
32.0000 [IU] | Freq: Every day | SUBCUTANEOUS | Status: DC
  Administered 2020-11-24 – 2020-11-26 (×3): 32 [IU] via SUBCUTANEOUS
  Filled 2020-11-24 (×4): qty 0.32

## 2020-11-24 MED ORDER — POTASSIUM CHLORIDE CRYS ER 20 MEQ PO TBCR
40.0000 meq | EXTENDED_RELEASE_TABLET | ORAL | Status: AC
  Administered 2020-11-24 (×2): 40 meq via ORAL
  Filled 2020-11-24: qty 2

## 2020-11-24 MED ORDER — SODIUM CHLORIDE 0.9 % IV SOLN: INTRAVENOUS | Status: AC

## 2020-11-24 NOTE — Progress Notes (Signed)
PROGRESS NOTE    Gabriel [name].  TOI:[id number] DOB: Jan 16, 1971 DOA: 11/23/2020 PCP: Virginia Crews, MD     Brief Narrative:  Gabriel Evinger. is a 50 y.o. male with medical history significant for insulin-dependent diabetes mellitus, hypertension, truncal obesity, OSA was recently diagnosed and has not received CPAP yet, heart failure reduced ejection fraction, hyperlipidemia, likely metabolic syndrome, presents emergency department for chief concerns of shortness of breath.  He states that his shortness of breath started on 11/22/2020, presented to the emergency department and his torsemide dose was decreased.  He also admits to some dizziness.  New events last 24 hours / Subjective: States that his shortness of breath has now resolved, continues to be very dizzy and does not feel back to his baseline.  Feels that he is taking too many medications and wonders if this has to do with his symptoms.  Assessment & Plan:   Principal Problem:   Acute kidney injury ([place]) Active Problems:   Essential hypertension   Hyperlipidemia   COPD exacerbation (HCC)   Atypical chest pain   Pulmonary embolism on right (HCC)   Hyperglycemia   OSA on CPAP   AKI -Baseline creatinine 1.2 -Suspect due to overdiuresis, medications  -Having good UOP now  -Hold home metolazone, torsemide, Entresto, spironolactone -Gentle hydration today   Dyspnea -D-dimer negative, chest x-ray negative -Resolved and remains on room air  Chronic systolic heart failure -Without acute exacerbation, BNP 92.9 -Continue Coreg, holding Entresto, metolazone, torsemide due to AKI  Insulin-dependent diabetes mellitus -Continue Lantus, sliding scale insulin  Hyperlipidemia -Continue Crestor  History of PE -Continue Xarelto  OSA -CPAP nightly  Hypokalemia -Replace, trend    DVT prophylaxis:  Place TED hose Start: 11/23/20 1804 rivaroxaban (XARELTO) tablet 20 mg  Code Status: Full Family  Communication: None at bedside  Disposition Plan:  Status is: Observation  The patient will require care spanning > 2 midnights and should be moved to inpatient because: Inpatient level of care appropriate due to severity of illness  Dispo: The patient is from: Home              Anticipated d/c is to: Home              Patient currently is not medically stable to d/c.  Resume gentle IV hydration today due to AKI   Difficult to place patient No   Antimicrobials:  Anti-infectives (From admission, onward)   None        Objective: Vitals:   11/23/20 2053 11/23/20 2300 11/24/20 0302 11/24/20 0650  BP: 128/74 118/70 107/60 122/64  Pulse: 81 76 72 66  Resp: (!) 24 (!) 22 20 20   Temp: 98.2 F (36.8 C) 98 F (36.7 C) 98.2 F (36.8 C) 98 F (36.7 C)  TempSrc: Oral Oral Axillary Oral  SpO2: 93% 100% 98%   Weight:      Height:        Intake/Output Summary (Last 24 hours) at 11/24/2020 1034 Last data filed at 11/24/2020 1015 Gross per 24 hour  Intake 620 ml  Output 800 ml  Net -180 ml   Filed Weights   11/23/20 1340  Weight: 105.7 kg    Examination:  General exam: Appears calm and comfortable  Respiratory system: Clear to auscultation. Respiratory effort normal. No respiratory distress. No conversational dyspnea.  Remains on room air Cardiovascular system: S1 & S2 heard, RRR. No murmurs. No pedal edema. Gastrointestinal system: Abdomen is nondistended, soft and nontender. Normal  bowel sounds heard. Central nervous system: Alert and oriented. No focal neurological deficits. Speech clear.  Extremities: Symmetric in appearance  Skin: No rashes, lesions or ulcers on exposed skin  Psychiatry: Judgement and insight appear normal. Mood & affect appropriate.   Data Reviewed: I have personally reviewed following labs and imaging studies  CBC: Recent Labs  Lab 11/22/20 1300 11/23/20 1345 11/24/20 0433  WBC 10.2 10.2 8.4  HGB 17.6* 17.7* 16.9  HCT 51.9 53.1* 51.0  MCV 82.1  80.9 81.6  PLT 204 223 025   Basic Metabolic Panel: Recent Labs  Lab 11/22/20 1300 11/23/20 1345 11/24/20 0433  NA 138 134* 136  K 3.8 3.7 3.2*  CL 96* 94* 93*  CO2 28 26 28   GLUCOSE 297* 300* 222*  BUN 51* 50* 50*  CREATININE 2.44* 2.30* 1.91*  CALCIUM 8.9 10.5* 11.1*   GFR: Estimated Creatinine Clearance: 57.5 mL/min (A) (by C-G formula based on SCr of 1.91 mg/dL (H)). Liver Function Tests: Recent Labs  Lab 11/23/20 1345  AST 21  ALT 19  ALKPHOS 67  BILITOT 1.2  PROT 8.0  ALBUMIN 4.2   Recent Labs  Lab 11/23/20 2101  LIPASE 36   No results for input(s): AMMONIA in the last 168 hours. Coagulation Profile: No results for input(s): INR, PROTIME in the last 168 hours. Cardiac Enzymes: Recent Labs  Lab 11/24/20 0433  CKTOTAL 49   BNP (last 3 results) No results for input(s): PROBNP in the last 8760 hours. HbA1C: No results for input(s): HGBA1C in the last 72 hours. CBG: Recent Labs  Lab 11/23/20 1846 11/23/20 2050 11/24/20 0747  GLUCAP 241* 327* 218*   Lipid Profile: Recent Labs    11/23/20 2101  CHOL 185  HDL 19*  LDLCALC UNABLE TO CALCULATE IF TRIGLYCERIDE OVER 400 mg/dL  TRIG 900*  CHOLHDL 9.7  LDLDIRECT 60.6   Thyroid Function Tests: Recent Labs    11/23/20 1904  TSH 1.211   Anemia Panel: No results for input(s): VITAMINB12, FOLATE, FERRITIN, TIBC, IRON, RETICCTPCT in the last 72 hours. Sepsis Labs: Recent Labs  Lab 11/23/20 1345  PROCALCITON 0.21    Recent Results (from the past 240 hour(s))  Resp Panel by RT-PCR (Flu A&B, Covid) Nasopharyngeal Swab     Status: None   Collection Time: 11/23/20  2:05 PM   Specimen: Nasopharyngeal Swab; Nasopharyngeal(NP) swabs in vial transport medium  Result Value Ref Range Status   SARS Coronavirus 2 by RT PCR NEGATIVE NEGATIVE Final    Comment: (NOTE) SARS-CoV-2 target nucleic acids are NOT DETECTED.  The SARS-CoV-2 RNA is generally detectable in upper respiratory specimens during the  acute phase of infection. The lowest concentration of SARS-CoV-2 viral copies this assay can detect is 138 copies/mL. A negative result does not preclude SARS-Cov-2 infection and should not be used as the sole basis for treatment or other patient management decisions. A negative result may occur with  improper specimen collection/handling, submission of specimen other than nasopharyngeal swab, presence of viral mutation(s) within the areas targeted by this assay, and inadequate number of viral copies(<138 copies/mL). A negative result must be combined with clinical observations, patient history, and epidemiological information. The expected result is Negative.  Fact Sheet for Patients:  EntrepreneurPulse.com.au  Fact Sheet for Healthcare Providers:  IncredibleEmployment.be  This test is no t yet approved or cleared by the [place] FDA and  has been authorized for detection and/or diagnosis of SARS-CoV-2 by FDA under an Emergency Use Authorization (EUA). This EUA will  remain  in effect (meaning this test can be used) for the duration of the COVID-19 declaration under Section 564(b)(1) of the Act, 21 U.S.C.section 360bbb-3(b)(1), unless the authorization is terminated  or revoked sooner.       Influenza A by PCR NEGATIVE NEGATIVE Final   Influenza B by PCR NEGATIVE NEGATIVE Final    Comment: (NOTE) The Xpert Xpress SARS-CoV-2/FLU/RSV plus assay is intended as an aid in the diagnosis of influenza from Nasopharyngeal swab specimens and should not be used as a sole basis for treatment. Nasal washings and aspirates are unacceptable for Xpert Xpress SARS-CoV-2/FLU/RSV testing.  Fact Sheet for Patients: EntrepreneurPulse.com.au  Fact Sheet for Healthcare Providers: IncredibleEmployment.be  This test is not yet approved or cleared by the [place] FDA and has been authorized for detection and/or  diagnosis of SARS-CoV-2 by FDA under an Emergency Use Authorization (EUA). This EUA will remain in effect (meaning this test can be used) for the duration of the COVID-19 declaration under Section 564(b)(1) of the Act, 21 U.S.C. section 360bbb-3(b)(1), unless the authorization is terminated or revoked.  Performed at [facility], [address], [place], [place] [address]       Radiology Studies: DG Chest Portable 1 View  Result Date: 11/23/2020 CLINICAL DATA:  Cough EXAM: PORTABLE CHEST 1 VIEW COMPARISON:  October 27, 2020 FINDINGS: Lungs are clear. Heart is borderline enlarged with pulmonary vascularity normal. No adenopathy. No bone lesions. IMPRESSION: Lungs clear.  Heart borderline enlarged. Electronically Signed   By: Lowella Grip III M.D.   On: 11/23/2020 14:33      Scheduled Meds: . carvedilol  6.25 mg Oral BID WC  . fenofibrate  160 mg Oral Daily  . insulin aspart  0-5 Units Subcutaneous QHS  . insulin aspart  0-9 Units Subcutaneous TID WC  . insulin glargine  28 Units Subcutaneous Q2200  . Ipratropium-Albuterol  1 puff Inhalation QID  . potassium chloride  40 mEq Oral Q4H  . rivaroxaban  20 mg Oral Q supper  . rosuvastatin  20 mg Oral QHS   Continuous Infusions: . sodium chloride       LOS: 0 days      Time spent: 30 minutes   Dessa Phi, DO [facility] 11/24/2020, 10:34 AM   Available via Epic secure chat 7am-7pm After these hours, please refer to coverage provider listed on [email or web address]

## 2020-11-24 NOTE — Plan of Care (Signed)
  Problem: Clinical Measurements: Goal: Ability to maintain clinical measurements within normal limits will improve Outcome: Progressing Goal: Will remain free from infection Outcome: Progressing Goal: Diagnostic test results will improve Outcome: Progressing Goal: Respiratory complications will improve Outcome: Progressing Goal: Cardiovascular complication will be avoided Outcome: Progressing   Problem: Pain Managment: Goal: General experience of comfort will improve Outcome: Progressing   Patient is involved in and agrees with the plan of care. V/S stable. Denies pain or SOB. Still feels fatigue. On Cpap at night time.

## 2020-11-24 NOTE — Progress Notes (Signed)
Inpatient Diabetes Program Recommendations  AACE/ADA: New Consensus Statement on Inpatient Glycemic Control (2015)  Target Ranges:  Prepandial:   less than 140 mg/dL      Peak postprandial:   less than 180 mg/dL (1-2 hours)      Critically ill patients:  140 - 180 mg/dL    Results for Gabriel [name], Gabriel [name] (MRN [id number]) as of 11/24/2020 12:00  Ref. Range 11/23/2020 18:46 11/23/2020 20:50 11/24/2020 07:47 11/24/2020 11:17  Glucose-Capillary Latest Ref Range: 70 - 99 mg/dL 241 (H) 327 (H)  4 units NOVOLOG  28 units LANTUS 218 (H)  3 units NOVOLOG @10 :46am 265 (H)     Home DM Meds: Basaglar 28 units QHS       Trulicity 409 mg Qweek    Current Orders: Lantus 28 units QHS       Novolog 0-9 units ac/hs    MD- Please consider:  1. Increase Lantus to 32 units QHS (15% increase)  2. Start Novolog Meal Coverage: Novolog 3 units TID with meals Hold if pt eats <50% of meal, Hold if pt NPO    --Will follow patient during hospitalization--  Wyn Quaker RN, MSN, CDE Diabetes Coordinator Inpatient Glycemic Control Team Team Pager: [phone] (8a-5p)

## 2020-11-25 ENCOUNTER — Inpatient Hospital Stay: Payer: Medicaid Other

## 2020-11-25 DIAGNOSIS — N179 Acute kidney failure, unspecified: Secondary | ICD-10-CM | POA: Diagnosis not present

## 2020-11-25 LAB — BASIC METABOLIC PANEL
Anion gap: 13 (ref 5–15)
BUN: 45 mg/dL — ABNORMAL HIGH (ref 6–20)
CO2: 26 mmol/L (ref 22–32)
Calcium: 10.4 mg/dL — ABNORMAL HIGH (ref 8.9–10.3)
Chloride: 96 mmol/L — ABNORMAL LOW (ref 98–111)
Creatinine, Ser: 1.49 mg/dL — ABNORMAL HIGH (ref 0.61–1.24)
GFR, Estimated: 56 mL/min — ABNORMAL LOW (ref >60)
Glucose, Bld: 173 mg/dL — ABNORMAL HIGH (ref 70–99)
Potassium: 3.6 mmol/L (ref 3.5–5.1)
Sodium: 135 mmol/L (ref 135–145)

## 2020-11-25 LAB — GLUCOSE, CAPILLARY
Glucose-Capillary: 159 mg/dL — ABNORMAL HIGH (ref 70–99)
Glucose-Capillary: 187 mg/dL — ABNORMAL HIGH (ref 70–99)
Glucose-Capillary: 211 mg/dL — ABNORMAL HIGH (ref 70–99)
Glucose-Capillary: 198 mg/dL — ABNORMAL HIGH (ref 70–99)

## 2020-11-25 LAB — MAGNESIUM: Magnesium: 1.5 mg/dL — ABNORMAL LOW (ref 1.7–2.4)

## 2020-11-25 MED ORDER — POTASSIUM CHLORIDE CRYS ER 20 MEQ PO TBCR
40.0000 meq | EXTENDED_RELEASE_TABLET | Freq: Once | ORAL | Status: AC
  Administered 2020-11-25: 40 meq via ORAL
  Filled 2020-11-25: qty 2

## 2020-11-25 MED ORDER — MAGNESIUM SULFATE 2 GM/50ML IV SOLN
2.0000 g | Freq: Once | INTRAVENOUS | Status: AC
  Administered 2020-11-25: 2 g via INTRAVENOUS
  Filled 2020-11-25: qty 50

## 2020-11-25 MED ORDER — SODIUM CHLORIDE 0.9 % IV SOLN: INTRAVENOUS | Status: DC

## 2020-11-25 NOTE — Progress Notes (Signed)
Reported feeling right sided chest pressure 1-2 hours worse than usual "like when I have too much fluid in my lungs." No alleviating or aggravating factors.  States he hasn't been putting out much urine. MD notified, see new orders.

## 2020-11-25 NOTE — Progress Notes (Signed)
Call from telemetry notifying of wide QRS x3 beats & x2 beats. 0.15.  MD notified see new orders.

## 2020-11-25 NOTE — Progress Notes (Signed)
PROGRESS NOTE    Gabriel [name].  LDJ:[id number] DOB: 09-Jun-1971 DOA: 11/23/2020 PCP: Virginia Crews, MD     Brief Narrative:  Gabriel Overley. is a 50 y.o. male with medical history significant for insulin-dependent diabetes mellitus, hypertension, truncal obesity, OSA was recently diagnosed and has not received CPAP yet, heart failure reduced ejection fraction, hyperlipidemia, likely metabolic syndrome, presents emergency department for chief concerns of shortness of breath.  He states that his shortness of breath started on 11/22/2020, presented to the emergency department and his torsemide dose was decreased.  He also admits to some dizziness.  New events last 24 hours / Subjective: Had an episode yesterday afternoon when he was not feeling as well, now feeling well this morning.  He denies any dizziness or shortness of breath on examination  Assessment & Plan:   Principal Problem:   Acute kidney injury ([place]) Active Problems:   Essential hypertension   Hyperlipidemia   COPD exacerbation (H[facility])   Atypical chest pain   Pulmonary embolism on right (HCC)   Hyperglycemia   OSA on CPAP   AKI (acute kidney injury) ([place])   AKI -Baseline creatinine 1.2 -Suspect due to overdiuresis, medications  -Having good UOP now  -Hold home metolazone, torsemide, Entresto, spironolactone -Continue gentle hydration, creatinine continues to improve  Dyspnea -D-dimer negative, chest x-ray negative -Resolved and remains on room air  Chronic systolic heart failure -Without acute exacerbation, BNP 92.9 -Continue Coreg, holding Entresto, metolazone, torsemide due to AKI  Insulin-dependent diabetes mellitus -Continue Lantus, sliding scale insulin  Hyperlipidemia -Continue Crestor  History of PE -Continue Xarelto  OSA -CPAP nightly    DVT prophylaxis:  Place TED hose Start: 11/23/20 1804 rivaroxaban (XARELTO) tablet 20 mg  Code Status: Full Family Communication: None at  bedside  Disposition Plan:  Status is: Inpatient  Remains inpatient appropriate because:IV treatments appropriate due to intensity of illness or inability to take PO   Dispo: The patient is from: Home              Anticipated d/c is to: Home              Patient currently is not medically stable to d/c.   Difficult to place patient No         Antimicrobials:  Anti-infectives (From admission, onward)   None       Objective: Vitals:   11/24/20 1940 11/24/20 2303 11/25/20 0432 11/25/20 0758  BP: (!) 142/79 111/66 129/67 (!) 147/84  Pulse: 76 76 68 69  Resp: 20 20 20 19   Temp: 98.4 F (36.9 C) 97.7 F (36.5 C) (!) 97.4 F (36.3 C) 97.6 F (36.4 C)  TempSrc: Oral Oral Oral Oral  SpO2: 93% 97% 99% 96%  Weight:      Height:        Intake/Output Summary (Last 24 hours) at 11/25/2020 0948 Last data filed at 11/25/2020 0521 Gross per 24 hour  Intake 1365.6 ml  Output 1900 ml  Net -534.4 ml   Filed Weights   11/23/20 1340  Weight: 105.7 kg    Examination: General exam: Appears calm and comfortable  Respiratory system: Clear to auscultation. Respiratory effort normal. Cardiovascular system: S1 & S2 heard, RRR. No pedal edema. Gastrointestinal system: Abdomen is nondistended, soft and nontender. Normal bowel sounds heard. Central nervous system: Alert and oriented. Non focal exam. Speech clear  Extremities: Symmetric in appearance bilaterally  Skin: No rashes, lesions or ulcers on exposed skin  Psychiatry: Judgement  and insight appear stable. Mood & affect appropriate.   Data Reviewed: I have personally reviewed following labs and imaging studies  CBC: Recent Labs  Lab 11/22/20 1300 11/23/20 1345 11/24/20 0433  WBC 10.2 10.2 8.4  HGB 17.6* 17.7* 16.9  HCT 51.9 53.1* 51.0  MCV 82.1 80.9 81.6  PLT 204 223 008   Basic Metabolic Panel: Recent Labs  Lab 11/22/20 1300 11/23/20 1345 11/24/20 0433 11/25/20 0419  NA 138 134* 136 135  K 3.8 3.7 3.2* 3.6   CL 96* 94* 93* 96*  CO2 28 26 28 26   GLUCOSE 297* 300* 222* 173*  BUN 51* 50* 50* 45*  CREATININE 2.44* 2.30* 1.91* 1.49*  CALCIUM 8.9 10.5* 11.1* 10.4*   GFR: Estimated Creatinine Clearance: 73.7 mL/min (A) (by C-G formula based on SCr of 1.49 mg/dL (H)). Liver Function Tests: Recent Labs  Lab 11/23/20 1345  AST 21  ALT 19  ALKPHOS 67  BILITOT 1.2  PROT 8.0  ALBUMIN 4.2   Recent Labs  Lab 11/23/20 2101  LIPASE 36   No results for input(s): AMMONIA in the last 168 hours. Coagulation Profile: No results for input(s): INR, PROTIME in the last 168 hours. Cardiac Enzymes: Recent Labs  Lab 11/24/20 0433  CKTOTAL 49   BNP (last 3 results) No results for input(s): PROBNP in the last 8760 hours. HbA1C: No results for input(s): HGBA1C in the last 72 hours. CBG: Recent Labs  Lab 11/24/20 1117 11/24/20 1239 11/24/20 1655 11/24/20 2040 11/25/20 0738  GLUCAP 265* 253* 182* 181* 159*   Lipid Profile: Recent Labs    11/23/20 2101  CHOL 185  HDL 19*  LDLCALC UNABLE TO CALCULATE IF TRIGLYCERIDE OVER 400 mg/dL  TRIG 900*  CHOLHDL 9.7  LDLDIRECT 60.6   Thyroid Function Tests: Recent Labs    11/23/20 1904  TSH 1.211   Anemia Panel: No results for input(s): VITAMINB12, FOLATE, FERRITIN, TIBC, IRON, RETICCTPCT in the last 72 hours. Sepsis Labs: Recent Labs  Lab 11/23/20 1345  PROCALCITON 0.21    Recent Results (from the past 240 hour(s))  Resp Panel by RT-PCR (Flu A&B, Covid) Nasopharyngeal Swab     Status: None   Collection Time: 11/23/20  2:05 PM   Specimen: Nasopharyngeal Swab; Nasopharyngeal(NP) swabs in vial transport medium  Result Value Ref Range Status   SARS Coronavirus 2 by RT PCR NEGATIVE NEGATIVE Final    Comment: (NOTE) SARS-CoV-2 target nucleic acids are NOT DETECTED.  The SARS-CoV-2 RNA is generally detectable in upper respiratory specimens during the acute phase of infection. The lowest concentration of SARS-CoV-2 viral copies this  assay can detect is 138 copies/mL. A negative result does not preclude SARS-Cov-2 infection and should not be used as the sole basis for treatment or other patient management decisions. A negative result may occur with  improper specimen collection/handling, submission of specimen other than nasopharyngeal swab, presence of viral mutation(s) within the areas targeted by this assay, and inadequate number of viral copies(<138 copies/mL). A negative result must be combined with clinical observations, patient history, and epidemiological information. The expected result is Negative.  Fact Sheet for Patients:  EntrepreneurPulse.com.au  Fact Sheet for Healthcare Providers:  IncredibleEmployment.be  This test is no t yet approved or cleared by the [place] FDA and  has been authorized for detection and/or diagnosis of SARS-CoV-2 by FDA under an Emergency Use Authorization (EUA). This EUA will remain  in effect (meaning this test can be used) for the duration of the COVID-19 declaration  under Section 564(b)(1) of the Act, 21 U.S.C.section 360bbb-3(b)(1), unless the authorization is terminated  or revoked sooner.       Influenza A by PCR NEGATIVE NEGATIVE Final   Influenza B by PCR NEGATIVE NEGATIVE Final    Comment: (NOTE) The Xpert Xpress SARS-CoV-2/FLU/RSV plus assay is intended as an aid in the diagnosis of influenza from Nasopharyngeal swab specimens and should not be used as a sole basis for treatment. Nasal washings and aspirates are unacceptable for Xpert Xpress SARS-CoV-2/FLU/RSV testing.  Fact Sheet for Patients: EntrepreneurPulse.com.au  Fact Sheet for Healthcare Providers: IncredibleEmployment.be  This test is not yet approved or cleared by the [place] FDA and has been authorized for detection and/or diagnosis of SARS-CoV-2 by FDA under an Emergency Use Authorization (EUA). This EUA will  remain in effect (meaning this test can be used) for the duration of the COVID-19 declaration under Section 564(b)(1) of the Act, 21 U.S.C. section 360bbb-3(b)(1), unless the authorization is terminated or revoked.  Performed at [facility], [address] [place], [place] [address]       Radiology Studies: DG Chest Portable 1 View  Result Date: 11/23/2020 CLINICAL DATA:  Cough EXAM: PORTABLE CHEST 1 VIEW COMPARISON:  October 27, 2020 FINDINGS: Lungs are clear. Heart is borderline enlarged with pulmonary vascularity normal. No adenopathy. No bone lesions. IMPRESSION: Lungs clear.  Heart borderline enlarged. Electronically Signed   By: Lowella Grip III M.D.   On: 11/23/2020 14:33      Scheduled Meds: . carvedilol  6.25 mg Oral BID WC  . fenofibrate  160 mg Oral Daily  . insulin aspart  0-5 Units Subcutaneous QHS  . insulin aspart  0-9 Units Subcutaneous TID WC  . insulin aspart  3 Units Subcutaneous TID WC  . insulin glargine  32 Units Subcutaneous Q2200  . Ipratropium-Albuterol  1 puff Inhalation QID  . rivaroxaban  20 mg Oral Q supper  . rosuvastatin  20 mg Oral QHS   Continuous Infusions: . sodium chloride 75 mL/hr at 11/25/20 0751     LOS: 1 day      Time spent: 24minutes   Gabriel Phi, DO [facility] 11/25/2020, 9:48 AM   Available via Epic secure chat 7am-7pm After these hours, please refer to coverage provider listed on [email or web address]

## 2020-11-26 ENCOUNTER — Inpatient Hospital Stay: Payer: Medicaid Other

## 2020-11-26 DIAGNOSIS — N179 Acute kidney failure, unspecified: Secondary | ICD-10-CM | POA: Diagnosis not present

## 2020-11-26 LAB — MAGNESIUM: Magnesium: 1.6 mg/dL — ABNORMAL LOW (ref 1.7–2.4)

## 2020-11-26 LAB — GLUCOSE, CAPILLARY
Glucose-Capillary: 189 mg/dL — ABNORMAL HIGH (ref 70–99)
Glucose-Capillary: 197 mg/dL — ABNORMAL HIGH (ref 70–99)
Glucose-Capillary: 170 mg/dL — ABNORMAL HIGH (ref 70–99)
Glucose-Capillary: 191 mg/dL — ABNORMAL HIGH (ref 70–99)

## 2020-11-26 LAB — BASIC METABOLIC PANEL
Anion gap: 11 (ref 5–15)
BUN: 45 mg/dL — ABNORMAL HIGH (ref 6–20)
CO2: 27 mmol/L (ref 22–32)
Calcium: 10.4 mg/dL — ABNORMAL HIGH (ref 8.9–10.3)
Chloride: 95 mmol/L — ABNORMAL LOW (ref 98–111)
Creatinine, Ser: 1.74 mg/dL — ABNORMAL HIGH (ref 0.61–1.24)
GFR, Estimated: 47 mL/min — ABNORMAL LOW (ref >60)
Glucose, Bld: 197 mg/dL — ABNORMAL HIGH (ref 70–99)
Potassium: 4.2 mmol/L (ref 3.5–5.1)
Sodium: 133 mmol/L — ABNORMAL LOW (ref 135–145)

## 2020-11-26 LAB — BRAIN NATRIURETIC PEPTIDE: B Natriuretic Peptide: 205.9 pg/mL — ABNORMAL HIGH (ref 0.0–100.0)

## 2020-11-26 LAB — TROPONIN I (HIGH SENSITIVITY): Troponin I (High Sensitivity): 49 ng/L — ABNORMAL HIGH (ref <18)

## 2020-11-26 MED ORDER — TORSEMIDE 20 MG PO TABS
20.0000 mg | ORAL_TABLET | Freq: Every day | ORAL | Status: DC
  Administered 2020-11-26 – 2020-11-27 (×2): 20 mg via ORAL
  Filled 2020-11-26 (×4): qty 1

## 2020-11-26 MED ORDER — MORPHINE SULFATE (PF) 2 MG/ML IV SOLN
2.0000 mg | Freq: Once | INTRAVENOUS | Status: AC
  Administered 2020-11-26: 2 mg via INTRAVENOUS
  Filled 2020-11-26: qty 1

## 2020-11-26 MED ORDER — MAGNESIUM SULFATE 4 GM/100ML IV SOLN
4.0000 g | Freq: Once | INTRAVENOUS | Status: AC
  Administered 2020-11-26: 4 g via INTRAVENOUS
  Filled 2020-11-26: qty 100

## 2020-11-26 MED ORDER — HYDROMORPHONE HCL 1 MG/ML IJ SOLN
0.5000 mg | INTRAMUSCULAR | Status: DC | PRN
  Administered 2020-11-26: 0.5 mg via INTRAVENOUS
  Filled 2020-11-26: qty 0.5

## 2020-11-26 MED ORDER — HYDROMORPHONE HCL 1 MG/ML IJ SOLN
0.5000 mg | Freq: Once | INTRAMUSCULAR | Status: AC
  Administered 2020-11-26: 0.5 mg via INTRAVENOUS
  Filled 2020-11-26: qty 0.5

## 2020-11-26 NOTE — TOC Initial Note (Signed)
Transition of Care (TOC) - Initial/Assessment Note    Patient Details  Name: Gabriel [name]. MRN: [id number] Date of Birth: August 31, 1970  Transition of Care [facility]) CM/SW Contact:    Magnus Ivan, LCSW Phone Number: 11/26/2020, 12:28 PM  Clinical Narrative:           Met with patient for Readmission Screening. Patient lives with his Father. Goes to [place] for PCP. [facility]. Typically drives himself places. Patient has a ride home when discharged. Patient denied needs at this time.        Expected Discharge Plan: Home/Self Care Barriers to Discharge: Barriers Resolved   Patient Goals and CMS Choice Patient states their goals for this hospitalization and ongoing recovery are:: to return home CMS Medicare.gov Compare Post Acute Care list provided to:: Patient Choice offered to / list presented to : Patient  Expected Discharge Plan and Services Expected Discharge Plan: Home/Self Care       Living arrangements for the past 2 months: Single Family Home                                      Prior Living Arrangements/Services Living arrangements for the past 2 months: Single Family Home Lives with:: Parents Patient language and need for interpreter reviewed:: Yes Do you feel safe going back to the place where you live?: Yes      Need for Family Participation in Patient Care: Yes (Comment) Care giver support system in place?: Yes (comment)   Criminal Activity/Legal Involvement Pertinent to Current Situation/Hospitalization: No - Comment as needed  Activities of Daily Living Home Assistive Devices/Equipment: CBG Meter,Eyeglasses ADL Screening (condition at time of admission) Patient's cognitive ability adequate to safely complete daily activities?: Yes Is the patient deaf or have difficulty hearing?: No Does the patient have difficulty seeing, even when wearing glasses/contacts?: No Does the patient have difficulty concentrating,  remembering, or making decisions?: No Patient able to express need for assistance with ADLs?: Yes Does the patient have difficulty dressing or bathing?: No Independently performs ADLs?: Yes (appropriate for developmental age) Does the patient have difficulty walking or climbing stairs?: No Weakness of Legs: None Weakness of Arms/Hands: None  Permission Sought/Granted                  Emotional Assessment       Orientation: : Oriented to Self,Oriented to Place,Oriented to  Time,Oriented to Situation Alcohol / Substance Use: Not Applicable Psych Involvement: Yes (comment)  Admission diagnosis:  Dehydration [E86.0] Weakness [R53.1] AKI (acute kidney injury) ([place]) [N17.9] Acute kidney injury ([place]) [N17.9] Patient Active Problem List   Diagnosis Date Noted  . AKI (acute kidney injury) ([place]) 11/24/2020  . Acute kidney injury ([place]) 11/23/2020  . OSA on CPAP 11/23/2020  . Chest pain 10/27/2020  . Snoring 08/15/2020  . Type II diabetes mellitus with renal manifestations ([place]) 06/11/2020  . COPD (chronic obstructive pulmonary disease) ([place]) 06/11/2020  . Leukocytosis 06/11/2020  . PE (pulmonary thromboembolism) ([place]) 06/11/2020  . Hyperglycemia   . Elevated brain natriuretic peptide (BNP) level   . Uncontrolled type 2 diabetes mellitus with hyperglycemia, with long-term current use of insulin ([place]) 05/13/2020  . Acute on chronic HFrEF (heart failure with reduced ejection fraction) ([place]) 05/12/2020  . Erectile dysfunction due to arterial insufficiency 03/15/2020  . Screening PSA (prostate specific antigen) 03/15/2020  . Erectile dysfunction 03/02/2020  .  Acute pulmonary embolism ([place]) 01/03/2020  . Pulmonary embolism on right ([place])   . 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 Hemoptysis   . Costochondritis   . Acute respiratory failure with hypoxia ([place])   . Acute on chronic combined systolic (congestive) and diastolic (congestive) heart failure ([place]) 12/20/2019  . Acute on  chronic heart failure ([place]) 11/22/2019  . CKD (chronic kidney disease), stage IIIa 06/26/2019  . Type 2 diabetes mellitus with hyperlipidemia ([place]) 06/26/2019  . Hypertensive urgency 06/26/2019  . Acute on chronic combined systolic and diastolic CHF (congestive heart failure) ([place]) 09/02/2017  . Other chest pain 08/24/2017  . Dizziness   . Noncompliance with medications 04/29/2017  . Back pain 03/06/2017  . Tobacco use 12/04/2016  . Gallbladder sludge 12/04/2016  . Acute on chronic systolic CHF (congestive heart failure) ([place]) 11/28/2016  . Coronary artery disease, non-occlusive 03/15/2016  . Atypical chest pain 02/16/2016  . Orthostatic hypotension 01/23/2016  . Hypomagnesemia 01/23/2016  . Chronic systolic CHF (congestive heart failure) ([place]) 01/23/2016  . NICM (nonischemic cardiomyopathy) ([place]) 01/17/2016  . NSVT (nonsustained ventricular tachycardia) ([place])   . Troponin I above reference range   . Hyperlipidemia 05/17/2015  . COPD exacerbation ([place]) 05/17/2015  . Essential hypertension 03/31/2015  . Elevated troponin 03/20/2015   PCP:  Virginia Crews, MD [facility]   [id number] [place] [place] [address] [id number]: [phone]: [phone]    Social Determinants of Health (SDOH) Interventions    Readmission Risk Interventions Readmission Risk Prevention Plan 11/26/2020 06/14/2020 05/14/2020  Transportation Screening Complete Complete Complete  PCP or Specialist Appt within 3-5 Days - - Complete  [place]- - Complete  Social Work Consult for [place]Counseling - - -  Palliative Care Screening - - Not Applicable  Medication Review (RN Care Manager) Complete Complete Complete  PCP or Specialist appointment within 3-5 days of discharge Complete (No Data) -  [facility] Consult Complete Complete -  SW Recovery Care/Counseling Consult Complete Complete -  Palliative Care  Screening Not Applicable Not Applicable -  [place] Not Applic[place] -  Some recent data might be hidden

## 2020-11-26 NOTE — Progress Notes (Signed)
PROGRESS NOTE    Gabriel [name].  KGY:[id number] DOB: [birth year] DOA: 11/23/2020 PCP: Virginia Crews, MD     Brief Narrative:  Gabriel [name]. is a 50 y.o. male with medical history significant for insulin-dependent diabetes mellitus, hypertension, truncal obesity, OSA was recently diagnosed and has not received CPAP yet, heart failure reduced ejection fraction, hyperlipidemia, likely metabolic syndrome, presents emergency department for chief concerns of shortness of breath.  He states that his shortness of breath started on 11/22/2020, presented to the emergency department and his torsemide dose was decreased.  He also admits to some dizziness.  New events last 24 hours / Subjective: Patient had episode of acute onset chest pain overnight, received nitro 3 times without relief.  Morphine without relief.  Dilaudid helped.  This morning, patient is symptom free.  He denies any further chest pains or shortness of breath.  Assessment & Plan:   Principal Problem:   Acute kidney injury ([place]) Active Problems:   Essential hypertension   Hyperlipidemia   COPD exacerbation (H[facility])   Atypical chest pain   Pulmonary embolism on right (HCC)   Hyperglycemia   OSA on CPAP   AKI (acute kidney injury) ([place])   AKI -Baseline creatinine 1.2 -Suspect due to overdiuresis, medications  -Hold home metolazone, Entresto, spironolactone -Initially received IV fluid, then subsequently developed mild fluid overload -Renal ultrasound showed left kidney cyst, unremarkable otherwise -Hold further IV fluid in setting of chronic heart failure.  He does not appear to be acutely fluid overloaded, has no signs of edema on examination. -Resume torsemide at a lower dose  Dyspnea -D-dimer negative, chest x-ray negative -Resolved and remains on room air  Chronic systolic heart failure -Without acute exacerbation, BNP 92.9 --> 205.9 -Continue Coreg, holding Entresto, metolazone, due to AKI -We will  resume torsemide  Insulin-dependent diabetes mellitus -Continue Lantus, sliding scale insulin  Hyperlipidemia -Continue Crestor  History of PE -Continue Xarelto  OSA -CPAP nightly  Hypomagnesia -Replace  Demand ischemia -Troponin 50-> 52-> 42-> 49    DVT prophylaxis:  Place TED hose Start: 11/23/20 1804 rivaroxaban (XARELTO) tablet 20 mg  Code Status: Full Family Communication: None at bedside  Disposition Plan:  Status is: Inpatient  Remains inpatient appropriate because:IV treatments appropriate due to intensity of illness or inability to take PO   Dispo: The patient is from: Home              Anticipated d/c is to: Home              Patient currently is not medically stable to d/c.  Continue to monitor kidney function closely.  Resuming his diuretic today   Difficult to place patient No         Antimicrobials:  Anti-infectives (From admission, onward)   None       Objective: Vitals:   11/26/20 0221 11/26/20 0445 11/26/20 0835 11/26/20 1133  BP: (!) 174/102 120/67 134/83 133/68  Pulse: 77 70 70 65  Resp:  16 18 18   Temp:  97.7 F (36.5 C) 97.7 F (36.5 C) 97.8 F (36.6 C)  TempSrc:  Oral Oral Oral  SpO2: 97% 96% 97% 97%  Weight:      Height:        Intake/Output Summary (Last 24 hours) at 11/26/2020 1200 Last data filed at 11/26/2020 0900 Gross per 24 hour  Intake 721.11 ml  Output 700 ml  Net 21.11 ml   Filed Weights   11/23/20 1340  Weight:  105.7 kg   Examination: General exam: Appears calm and comfortable  Respiratory system: Clear to auscultation. Respiratory effort normal. Cardiovascular system: S1 & S2 heard, RRR. No pedal edema. Gastrointestinal system: Abdomen is nondistended, soft and nontender. Normal bowel sounds heard. Central nervous system: Alert and oriented. Non focal exam. Speech clear  Extremities: Symmetric in appearance bilaterally  Skin: No rashes, lesions or ulcers on exposed skin  Psychiatry: Judgement and  insight appear stable. Mood & affect appropriate.    Data Reviewed: I have personally reviewed following labs and imaging studies  CBC: Recent Labs  Lab 11/22/20 1300 11/23/20 1345 11/24/20 0433  WBC 10.2 10.2 8.4  HGB 17.6* 17.7* 16.9  HCT 51.9 53.1* 51.0  MCV 82.1 80.9 81.6  PLT 204 223 740   Basic Metabolic Panel: Recent Labs  Lab 11/22/20 1300 11/23/20 1345 11/24/20 0433 11/25/20 0419 11/25/20 1204 11/26/20 0228  NA 138 134* 136 135  --  133*  K 3.8 3.7 3.2* 3.6  --  4.2  CL 96* 94* 93* 96*  --  95*  CO2 28 26 28 26   --  27  GLUCOSE 297* 300* 222* 173*  --  197*  BUN 51* 50* 50* 45*  --  45*  CREATININE 2.44* 2.30* 1.91* 1.49*  --  1.74*  CALCIUM 8.9 10.5* 11.1* 10.4*  --  10.4*  MG  --   --   --   --  1.5* 1.6*   GFR: Estimated Creatinine Clearance: 63.1 mL/min (A) (by C-G formula based on SCr of 1.74 mg/dL (H)). Liver Function Tests: Recent Labs  Lab 11/23/20 1345  AST 21  ALT 19  ALKPHOS 67  BILITOT 1.2  PROT 8.0  ALBUMIN 4.2   Recent Labs  Lab 11/23/20 2101  LIPASE 36   No results for input(s): AMMONIA in the last 168 hours. Coagulation Profile: No results for input(s): INR, PROTIME in the last 168 hours. Cardiac Enzymes: Recent Labs  Lab 11/24/20 0433  CKTOTAL 49   BNP (last 3 results) No results for input(s): PROBNP in the last 8760 hours. HbA1C: No results for input(s): HGBA1C in the last 72 hours. CBG: Recent Labs  Lab 11/25/20 1128 11/25/20 1624 11/25/20 2102 11/26/20 0734 11/26/20 1134  GLUCAP 187* 211* 198* 189* 197*   Lipid Profile: Recent Labs    11/23/20 2101  CHOL 185  HDL 19*  LDLCALC UNABLE TO CALCULATE IF TRIGLYCERIDE OVER 400 mg/dL  TRIG 900*  CHOLHDL 9.7  LDLDIRECT 60.6   Thyroid Function Tests: Recent Labs    11/23/20 1904  TSH 1.211   Anemia Panel: No results for input(s): VITAMINB12, FOLATE, FERRITIN, TIBC, IRON, RETICCTPCT in the last 72 hours. Sepsis Labs: Recent Labs  Lab 11/23/20 1345   PROCALCITON 0.21    Recent Results (from the past 240 hour(s))  Resp Panel by RT-PCR (Flu A&B, Covid) Nasopharyngeal Swab     Status: None   Collection Time: 11/23/20  2:05 PM   Specimen: Nasopharyngeal Swab; Nasopharyngeal(NP) swabs in vial transport medium  Result Value Ref Range Status   SARS Coronavirus 2 by RT PCR NEGATIVE NEGATIVE Final    Comment: (NOTE) SARS-CoV-2 target nucleic acids are NOT DETECTED.  The SARS-CoV-2 RNA is generally detectable in upper respiratory specimens during the acute phase of infection. The lowest concentration of SARS-CoV-2 viral copies this assay can detect is 138 copies/mL. A negative result does not preclude SARS-Cov-2 infection and should not be used as the sole basis for treatment or other patient  management decisions. A negative result may occur with  improper specimen collection/handling, submission of specimen other than nasopharyngeal swab, presence of viral mutation(s) within the areas targeted by this assay, and inadequate number of viral copies(<138 copies/mL). A negative result must be combined with clinical observations, patient history, and epidemiological information. The expected result is Negative.  Fact Sheet for Patients:  EntrepreneurPulse.com.au  Fact Sheet for Healthcare Providers:  IncredibleEmployment.be  This test is no t yet approved or cleared by the [place] FDA and  has been authorized for detection and/or diagnosis of SARS-CoV-2 by FDA under an Emergency Use Authorization (EUA). This EUA will remain  in effect (meaning this test can be used) for the duration of the COVID-19 declaration under Section 564(b)(1) of the Act, 21 U.S.C.section 360bbb-3(b)(1), unless the authorization is terminated  or revoked sooner.       Influenza A by PCR NEGATIVE NEGATIVE Final   Influenza B by PCR NEGATIVE NEGATIVE Final    Comment: (NOTE) The Xpert Xpress SARS-CoV-2/FLU/RSV plus  assay is intended as an aid in the diagnosis of influenza from Nasopharyngeal swab specimens and should not be used as a sole basis for treatment. Nasal washings and aspirates are unacceptable for Xpert Xpress SARS-CoV-2/FLU/RSV testing.  Fact Sheet for Patients: EntrepreneurPulse.com.au  Fact Sheet for Healthcare Providers: IncredibleEmployment.be  This test is not yet approved or cleared by the [place] FDA and has been authorized for detection and/or diagnosis of SARS-CoV-2 by FDA under an Emergency Use Authorization (EUA). This EUA will remain in effect (meaning this test can be used) for the duration of the COVID-19 declaration under Section 564(b)(1) of the Act, 21 U.S.C. section 360bbb-3(b)(1), unless the authorization is terminated or revoked.  Performed at [facility], [address] [place], [place] [address]       Radiology Studies: US RENAL  Result Date: 11/26/2020 CLINICAL DATA:  Acute kidney injury EXAM: RENAL / URINARY TRACT ULTRASOUND COMPLETE COMPARISON:  Abdominal ultrasound Dec 23, 2019 FINDINGS: Right Kidney: Renal measurements: 12.3 x 5.6 x 6.1 cm = volume: 227.9 mL. Echogenicity and renal cortical thickness are within normal limits. No mass, perinephric fluid, or hydronephrosis visualized. No sonographically demonstrable calculus or ureterectasis. Left Kidney: Renal measurements: 10.6 x 5.3 x 4.7 cm = volume: 137.4 mL. Echogenicity and renal cortical thickness are within normal limits. No perinephric fluid or hydronephrosis visualized. There is a cyst in the mid right kidney measuring 1.3 x 1.3 x 1.1 cm. No sonographically demonstrable calculus or ureterectasis. Bladder: Appears normal for degree of bladder distention. Other: None. IMPRESSION: Cyst mid left kidney measuring 1.3 x 1.3 x 1.1 cm. Study otherwise unremarkable. Electronically Signed   By: Lowella Grip III M.D.   On: 11/26/2020 10:22   DG Chest  Port 1 View  Result Date: 11/25/2020 CLINICAL DATA:  Dizziness and weakness. EXAM: PORTABLE CHEST 1 VIEW COMPARISON:  11/23/2020 FINDINGS: 1344 hours. Low volume film. Cardiopericardial silhouette is at upper limits of normal for size. There is pulmonary vascular congestion without overt pulmonary edema. Streaky opacity in the left parahilar lung compatible with atelectasis. No overt edema, focal consolidation, or pleural effusion. The visualized bony structures of the thorax show no acute abnormality. Telemetry leads overlie the chest. IMPRESSION: Low volume film with pulmonary vascular congestion. Electronically Signed   By: Misty Stanley M.D.   On: 11/25/2020 14:22      Scheduled Meds: . carvedilol  6.25 mg Oral BID WC  . fenofibrate  160 mg Oral Daily  .  insulin aspart  0-5 Units Subcutaneous QHS  . insulin aspart  0-9 Units Subcutaneous TID WC  . insulin aspart  3 Units Subcutaneous TID WC  . insulin glargine  32 Units Subcutaneous Q2200  . Ipratropium-Albuterol  1 puff Inhalation QID  . rivaroxaban  20 mg Oral Q supper  . rosuvastatin  20 mg Oral QHS  . torsemide  20 mg Oral Daily   Continuous Infusions:    LOS: 2 days      Time spent: 25 minutes   Dessa Phi, DO [facility] 11/26/2020, 12:00 PM   Available via Epic secure chat 7am-7pm After these hours, please refer to coverage provider listed on [email or web address]

## 2020-11-26 NOTE — Progress Notes (Signed)
Pt complaining of new onset chest pain 10/10. He states that it feels different than his baseline chest pressure. Pain is not radiating, but is very sharp. No complaints of shortness of breath. PRN nitro's given three times, with no relief. NSR on tele. Notified NP on call. Labs ordered. Orders for one time dose of morphine, with no relief. Orders for dilaudid, and pt is now starting to feel some relief 8/10. He states that he feels he is back to his baseline chest pressure that he is used to. BP elevated at 174/102, HR 79. NP aware. Will continue to monitor.

## 2020-11-27 DIAGNOSIS — N179 Acute kidney failure, unspecified: Secondary | ICD-10-CM | POA: Diagnosis not present

## 2020-11-27 LAB — BASIC METABOLIC PANEL
Anion gap: 14 (ref 5–15)
BUN: 50 mg/dL — ABNORMAL HIGH (ref 6–20)
CO2: 26 mmol/L (ref 22–32)
Calcium: 9.7 mg/dL (ref 8.9–10.3)
Chloride: 96 mmol/L — ABNORMAL LOW (ref 98–111)
Creatinine, Ser: 1.69 mg/dL — ABNORMAL HIGH (ref 0.61–1.24)
GFR, Estimated: 49 mL/min — ABNORMAL LOW (ref >60)
Glucose, Bld: 155 mg/dL — ABNORMAL HIGH (ref 70–99)
Potassium: 3.6 mmol/L (ref 3.5–5.1)
Sodium: 136 mmol/L (ref 135–145)

## 2020-11-27 LAB — MAGNESIUM: Magnesium: 1.7 mg/dL (ref 1.7–2.4)

## 2020-11-27 LAB — GLUCOSE, CAPILLARY: Glucose-Capillary: 177 mg/dL — ABNORMAL HIGH (ref 70–99)

## 2020-11-27 MED ORDER — TORSEMIDE 20 MG PO TABS: 20.0000 mg | ORAL_TABLET | Freq: Every day | ORAL | 0 refills | Status: DC

## 2020-11-27 NOTE — Discharge Summary (Signed)
Physician Discharge Summary  Gabriel [name]. RDE:[id number] DOB: 1971-01-08 DOA: 11/23/2020  PCP: Virginia Crews, MD  Admit date: 11/23/2020 Discharge date: 11/27/2020  Admitted From: Home Disposition:  Home  Recommendations for Outpatient Follow-up:  1. Follow up with PCP in 1 week 2. Please obtain BMP in 1 week. Resume entresto, aldactone pending improvement in kidney function.   Discharge Condition: Stable CODE STATUS: Full  Diet recommendation: Heart healthy   Brief/Interim Summary: Gabriel Lips. is a 50 y.o.malewith medical history significant forinsulin-dependent diabetes mellitus, hypertension, truncal obesity, OSA was recently diagnosed and has not received CPAP yet, heart failure reduced ejection fraction, hyperlipidemia, likely metabolic syndrome, presents emergency department for chief concerns of shortness of breath.  He states that his shortness of breath started on 11/22/2020, presented to the emergency department and his torsemide dose was decreased.  He also admits to some dizziness.   He was found to have AKI with elevated Cr. He was given IVF with initial improvement in Cr. Due to some symptoms of SOB and CP, patient was resumed on lower dose of torsemide. On day of discharge, patient was feeling well without any symptoms of CP, SOB, dizziness. He stated he was urinating well without issue.   Discharge Diagnoses:  Principal Problem:   Acute kidney injury ([place]) Active Problems:   Essential hypertension   Hyperlipidemia   COPD exacerbation (HCC)   Atypical chest pain   Pulmonary embolism on right (HCC)   Hyperglycemia   OSA on CPAP   AKI (acute kidney injury) ([place])   AKI -Baseline creatinine 1.2 -Suspect due to overdiuresis, medications  -Hold home metolazone, Entresto, spironolactone -Initially received IV fluid, then subsequently developed mild fluid overload -Renal ultrasound showed left kidney cyst, unremarkable otherwise -Resume torsemide at  a lower dose. Continue to hold aldactone and entresto. Discussed with patient to have close follow up with PCP and repeat blood work in 1 week.   Dyspnea -D-dimer negative, chest x-ray negative -Resolved and remains on room air  Chronic systolic heart failure -Without acute exacerbation, BNP 92.9 --> 205.9 -Continue Coreg, holding Entresto, metolazone, due to AKI -We will resume torsemide  Insulin-dependent diabetes mellitus -Continue Lantus, sliding scale insulin  Hyperlipidemia -Continue Crestor  History of PE -Continue Xarelto  OSA -CPAP nightly  Demand ischemia -Troponin 50-> 52-> 42-> 49   Discharge Instructions  Discharge Instructions    (HEART FAILURE PATIENTS) Call MD:  Anytime you have any of the following symptoms: 1) 3 pound weight gain in 24 hours or 5 pounds in 1 week 2) shortness of breath, with or without a dry hacking cough 3) swelling in the hands, feet or stomach 4) if you have to sleep on extra pillows at night in order to breathe.   Complete by: As directed    Call MD for:  difficulty breathing, headache or visual disturbances   Complete by: As directed    Call MD for:  extreme fatigue   Complete by: As directed    Call MD for:  persistant dizziness or light-headedness   Complete by: As directed    Call MD for:  persistant nausea and vomiting   Complete by: As directed    Call MD for:  severe uncontrolled pain   Complete by: As directed    Call MD for:  temperature >100.4   Complete by: As directed    Diet - low sodium heart healthy   Complete by: As directed    Discharge instructions  Complete by: As directed    You were cared for by a hospitalist during your hospital stay. If you have any questions about your discharge medications or the care you received while you were in the hospital after you are discharged, you can call the unit and ask to speak with the hospitalist on call if the hospitalist that took care of you is not available.  Once you are discharged, your primary care physician will handle any further medical issues. Please note that NO REFILLS for any discharge medications will be authorized once you are discharged, as it is imperative that you return to your primary care physician (or establish a relationship with a primary care physician if you do not have one) for your aftercare needs so that they can reassess your need for medications and monitor your lab values.   Increase activity slowly   Complete by: As directed      Allergies as of 11/27/2020      Reactions   Metformin And Related Nausea And Vomiting   Prednisone Other (See Comments)   Reaction: Hallucinations    Zantac [ranitidine Hcl] Diarrhea, Nausea Only   Night sweats      Medication List    STOP taking these medications   sacubitril-valsartan 49-51 MG Commonly known as: ENTRESTO   spironolactone 25 MG tablet Commonly known as: ALDACTONE     TAKE these medications   Basaglar KwikPen 100 UNIT/ML Inject 28 Units into the skin at bedtime.   carvedilol 6.25 MG tablet Commonly known as: COREG Take 1 tablet (6.25 mg total) by mouth 2 (two) times daily with a meal.   cetirizine 10 MG tablet Commonly known as: ZYRTEC Take 10 mg by mouth daily.   Combivent Respimat 20-100 MCG/ACT Aers respimat Generic drug: Ipratropium-Albuterol Inhale 1 puff into the lungs 4 (four) times daily.   fenofibrate 145 MG tablet Commonly known as: Tricor Take 1 tablet (145 mg total) by mouth daily.   metolazone 2.5 MG tablet Commonly known as: ZAROXOLYN Take 1 tablet (2.5 mg total) by mouth daily as needed (sob,leg edema).   nitroGLYCERIN 0.4 MG SL tablet Commonly known as: NITROSTAT Place 1 tablet (0.4 mg total) under the tongue every 5 (five) minutes x 3 doses as needed for chest pain.   rivaroxaban 20 MG Tabs tablet Commonly known as: XARELTO Take 1 tablet (20 mg total) by mouth daily with supper.   rosuvastatin 20 MG tablet Commonly known as:  CRESTOR Take 1 tablet (20 mg total) by mouth at bedtime.   torsemide 20 MG tablet Commonly known as: DEMADEX Take 1 tablet (20 mg total) by mouth daily. What changed: how much to take   Trulicity 2.24 VH/4.6WV Sopn Generic drug: Dulaglutide INJECT 0.75 MG INTO THE SKIN ONCE A WEEK.       Follow-up Information    Bacigalupo, Dionne Bucy, MD Follow up.   Specialty: Family Medicine Why: Repeat BMP in 1 week. Resume entresto, aldactone pending improvement in kidney function.  Contact information: [address] [place] [address] [phone]              Allergies  Allergen Reactions  . Metformin And Related Nausea And Vomiting  . Prednisone Other (See Comments)    Reaction: Hallucinations    . Zantac [Ranitidine Hcl] Diarrhea and Nausea Only    Night sweats    Consultations:  None    Procedures/Studies: US RENAL  Result Date: 11/26/2020 CLINICAL DATA:  Acute kidney injury EXAM: RENAL /  URINARY TRACT ULTRASOUND COMPLETE COMPARISON:  Abdominal ultrasound Dec 23, 2019 FINDINGS: Right Kidney: Renal measurements: 12.3 x 5.6 x 6.1 cm = volume: 227.9 mL. Echogenicity and renal cortical thickness are within normal limits. No mass, perinephric fluid, or hydronephrosis visualized. No sonographically demonstrable calculus or ureterectasis. Left Kidney: Renal measurements: 10.6 x 5.3 x 4.7 cm = volume: 137.4 mL. Echogenicity and renal cortical thickness are within normal limits. No perinephric fluid or hydronephrosis visualized. There is a cyst in the mid right kidney measuring 1.3 x 1.3 x 1.1 cm. No sonographically demonstrable calculus or ureterectasis. Bladder: Appears normal for degree of bladder distention. Other: None. IMPRESSION: Cyst mid left kidney measuring 1.3 x 1.3 x 1.1 cm. Study otherwise unremarkable. Electronically Signed   By: Lowella Grip III M.D.   On: 11/26/2020 10:22   DG Chest Port 1 View  Result Date: 11/25/2020 CLINICAL DATA:  Dizziness and  weakness. EXAM: PORTABLE CHEST 1 VIEW COMPARISON:  11/23/2020 FINDINGS: 1344 hours. Low volume film. Cardiopericardial silhouette is at upper limits of normal for size. There is pulmonary vascular congestion without overt pulmonary edema. Streaky opacity in the left parahilar lung compatible with atelectasis. No overt edema, focal consolidation, or pleural effusion. The visualized bony structures of the thorax show no acute abnormality. Telemetry leads overlie the chest. IMPRESSION: Low volume film with pulmonary vascular congestion. Electronically Signed   By: Misty Stanley M.D.   On: 11/25/2020 14:22   DG Chest Portable 1 View  Result Date: 11/23/2020 CLINICAL DATA:  Cough EXAM: PORTABLE CHEST 1 VIEW COMPARISON:  October 27, 2020 FINDINGS: Lungs are clear. Heart is borderline enlarged with pulmonary vascularity normal. No adenopathy. No bone lesions. IMPRESSION: Lungs clear.  Heart borderline enlarged. Electronically Signed   By: Lowella Grip III M.D.   On: 11/23/2020 14:33   SLEEP STUDY DOCUMENTS  Result Date: 10/31/2020 Ordered by an unspecified provider.     Discharge Exam: Vitals:   11/26/20 2328 11/27/20 0424  BP: 129/70 140/79  Pulse: 72 67  Resp: 20 20  Temp: 98 F (36.7 C) 98.4 F (36.9 C)  SpO2: 96% 97%    General: Pt is alert, awake, not in acute distress Cardiovascular: RRR, S1/S2 +, no edema Respiratory: CTA bilaterally, no wheezing, no rhonchi, no respiratory distress, no conversational dyspnea, on room air  Abdominal: Soft, NT, ND, bowel sounds + Extremities: no edema, no cyanosis Psych: Normal mood and affect, stable judgement and insight     The results of significant diagnostics from this hospitalization (including imaging, microbiology, ancillary and laboratory) are listed below for reference.     Microbiology: Recent Results (from the past 240 hour(s))  Resp Panel by RT-PCR (Flu A&B, Covid) Nasopharyngeal Swab     Status: None   Collection Time: 11/23/20   2:05 PM   Specimen: Nasopharyngeal Swab; Nasopharyngeal(NP) swabs in vial transport medium  Result Value Ref Range Status   SARS Coronavirus 2 by RT PCR NEGATIVE NEGATIVE Final    Comment: (NOTE) SARS-CoV-2 target nucleic acids are NOT DETECTED.  The SARS-CoV-2 RNA is generally detectable in upper respiratory specimens during the acute phase of infection. The lowest concentration of SARS-CoV-2 viral copies this assay can detect is 138 copies/mL. A negative result does not preclude SARS-Cov-2 infection and should not be used as the sole basis for treatment or other patient management decisions. A negative result may occur with  improper specimen collection/handling, submission of specimen other than nasopharyngeal swab, presence of viral mutation(s) within the areas targeted by  this assay, and inadequate number of viral copies(<138 copies/mL). A negative result must be combined with clinical observations, patient history, and epidemiological information. The expected result is Negative.  Fact Sheet for Patients:  EntrepreneurPulse.com.au  Fact Sheet for Healthcare Providers:  IncredibleEmployment.be  This test is no t yet approved or cleared by the [place] FDA and  has been authorized for detection and/or diagnosis of SARS-CoV-2 by FDA under an Emergency Use Authorization (EUA). This EUA will remain  in effect (meaning this test can be used) for the duration of the COVID-19 declaration under Section 564(b)(1) of the Act, 21 U.S.C.section 360bbb-3(b)(1), unless the authorization is terminated  or revoked sooner.       Influenza A by PCR NEGATIVE NEGATIVE Final   Influenza B by PCR NEGATIVE NEGATIVE Final    Comment: (NOTE) The Xpert Xpress SARS-CoV-2/FLU/RSV plus assay is intended as an aid in the diagnosis of influenza from Nasopharyngeal swab specimens and should not be used as a sole basis for treatment. Nasal washings and aspirates  are unacceptable for Xpert Xpress SARS-CoV-2/FLU/RSV testing.  Fact Sheet for Patients: EntrepreneurPulse.com.au  Fact Sheet for Healthcare Providers: IncredibleEmployment.be  This test is not yet approved or cleared by the [place] FDA and has been authorized for detection and/or diagnosis of SARS-CoV-2 by FDA under an Emergency Use Authorization (EUA). This EUA will remain in effect (meaning this test can be used) for the duration of the COVID-19 declaration under Section 564(b)(1) of the Act, 21 U.S.C. section 360bbb-3(b)(1), unless the authorization is terminated or revoked.  Performed at [facility], [address] [place], [place] [address]      Labs: BNP (last 3 results) Recent Labs    10/27/20 0941 11/23/20 1904 11/26/20 0228  BNP 510.1* 92.9 478.2*   Basic Metabolic Panel: Recent Labs  Lab 11/23/20 1345 11/24/20 0433 11/25/20 0419 11/25/20 1204 11/26/20 0228 11/27/20 0556  NA 134* 136 135  --  133* 136  K 3.7 3.2* 3.6  --  4.2 3.6  CL 94* 93* 96*  --  95* 96*  CO2 26 28 26   --  27 26  GLUCOSE 300* 222* 173*  --  197* 155*  BUN 50* 50* 45*  --  45* 50*  CREATININE 2.30* 1.91* 1.49*  --  1.74* 1.69*  CALCIUM 10.5* 11.1* 10.4*  --  10.4* 9.7  MG  --   --   --  1.5* 1.6* 1.7   Liver Function Tests: Recent Labs  Lab 11/23/20 1345  AST 21  ALT 19  ALKPHOS 67  BILITOT 1.2  PROT 8.0  ALBUMIN 4.2   Recent Labs  Lab 11/23/20 2101  LIPASE 36   No results for input(s): AMMONIA in the last 168 hours. CBC: Recent Labs  Lab 11/22/20 1300 11/23/20 1345 11/24/20 0433  WBC 10.2 10.2 8.4  HGB 17.6* 17.7* 16.9  HCT 51.9 53.1* 51.0  MCV 82.1 80.9 81.6  PLT 204 223 173   Cardiac Enzymes: Recent Labs  Lab 11/24/20 0433  CKTOTAL 49   BNP: Invalid input(s): POCBNP CBG: Recent Labs  Lab 11/26/20 0734 11/26/20 1134 11/26/20 1630 11/26/20 2059 11/27/20 0733  GLUCAP 189* 197* 170* 191* 177*    D-Dimer No results for input(s): DDIMER in the last 72 hours. Hgb A1c No results for input(s): HGBA1C in the last 72 hours. Lipid Profile No results for input(s): CHOL, HDL, LDLCALC, TRIG, CHOLHDL, LDLDIRECT in the last 72 hours. Thyroid function studies No results for input(s): TSH,  T4TOTAL, T3FREE, THYROIDAB in the last 72 hours.  Invalid input(s): FREET3 Anemia work up No results for input(s): VITAMINB12, FOLATE, FERRITIN, TIBC, IRON, RETICCTPCT in the last 72 hours. Urinalysis    Component Value Date/Time   COLORURINE YELLOW (A) 11/23/2020 1441   APPEARANCEUR HAZY (A) 11/23/2020 1441   APPEARANCEUR Clear 03/08/2020 0951   LABSPEC 1.008 11/23/2020 1441   LABSPEC 1.019 11/12/2012 1713   PHURINE 5.0 11/23/2020 1441   GLUCOSEU NEGATIVE 11/23/2020 1441   GLUCOSEU Negative 11/12/2012 1713   HGBUR NEGATIVE 11/23/2020 1441   BILIRUBINUR NEGATIVE 11/23/2020 1441   BILIRUBINUR Negative 03/08/2020 0951   BILIRUBINUR Negative 11/12/2012 1713   KETONESUR NEGATIVE 11/23/2020 1441   PROTEINUR NEGATIVE 11/23/2020 1441   NITRITE NEGATIVE 11/23/2020 1441   LEUKOCYTESUR NEGATIVE 11/23/2020 1441   LEUKOCYTESUR Negative 11/12/2012 1713   Sepsis Labs Invalid input(s): PROCALCITONIN,  WBC,  LACTICIDVEN Microbiology Recent Results (from the past 240 hour(s))  Resp Panel by RT-PCR (Flu A&B, Covid) Nasopharyngeal Swab     Status: None   Collection Time: 11/23/20  2:05 PM   Specimen: Nasopharyngeal Swab; Nasopharyngeal(NP) swabs in vial transport medium  Result Value Ref Range Status   SARS Coronavirus 2 by RT PCR NEGATIVE NEGATIVE Final    Comment: (NOTE) SARS-CoV-2 target nucleic acids are NOT DETECTED.  The SARS-CoV-2 RNA is generally detectable in upper respiratory specimens during the acute phase of infection. The lowest concentration of SARS-CoV-2 viral copies this assay can detect is 138 copies/mL. A negative result does not preclude SARS-Cov-2 infection and should not be used as  the sole basis for treatment or other patient management decisions. A negative result may occur with  improper specimen collection/handling, submission of specimen other than nasopharyngeal swab, presence of viral mutation(s) within the areas targeted by this assay, and inadequate number of viral copies(<138 copies/mL). A negative result must be combined with clinical observations, patient history, and epidemiological information. The expected result is Negative.  Fact Sheet for Patients:  EntrepreneurPulse.com.au  Fact Sheet for Healthcare Providers:  IncredibleEmployment.be  This test is no t yet approved or cleared by the [place] FDA and  has been authorized for detection and/or diagnosis of SARS-CoV-2 by FDA under an Emergency Use Authorization (EUA). This EUA will remain  in effect (meaning this test can be used) for the duration of the COVID-19 declaration under Section 564(b)(1) of the Act, 21 U.S.C.section 360bbb-3(b)(1), unless the authorization is terminated  or revoked sooner.       Influenza A by PCR NEGATIVE NEGATIVE Final   Influenza B by PCR NEGATIVE NEGATIVE Final    Comment: (NOTE) The Xpert Xpress SARS-CoV-2/FLU/RSV plus assay is intended as an aid in the diagnosis of influenza from Nasopharyngeal swab specimens and should not be used as a sole basis for treatment. Nasal washings and aspirates are unacceptable for Xpert Xpress SARS-CoV-2/FLU/RSV testing.  Fact Sheet for Patients: EntrepreneurPulse.com.au  Fact Sheet for Healthcare Providers: IncredibleEmployment.be  This test is not yet approved or cleared by the [place] FDA and has been authorized for detection and/or diagnosis of SARS-CoV-2 by FDA under an Emergency Use Authorization (EUA). This EUA will remain in effect (meaning this test can be used) for the duration of the COVID-19 declaration under Section 564(b)(1) of  the Act, 21 U.S.C. section 360bbb-3(b)(1), unless the authorization is terminated or revoked.  Performed at [facility], [address], [place], [place] [address]      Patient was seen and examined on the day of discharge  and was found to be in stable condition. Time coordinating discharge: 20 minutes including assessment and coordination of care, as well as examination of the patient.   SIGNED:  Dessa Phi, DO [facility] 11/27/2020, 8:04 AM

## 2020-11-27 NOTE — TOC Transition Note (Signed)
Transition of Care [facility]) - CM/SW Discharge Note   Patient Details  Name: Gabriel [name]. MRN: [id number] Date of Birth: Aug 04, 1971  [facility] CM/SW Contact:  Beverly Sessions, RN Phone Number: 11/27/2020, 8:59 AM   Clinical Narrative:    Patient to discharge home today, Follow up appointments made.  No needs identified for TOC    Barriers to Discharge: Barriers Resolved   Patient Goals and CMS Choice Patient states their goals for this hospitalization and ongoing recovery are:: to return home CMS Medicare.gov Compare Post Acute Care list provided to:: Patient Choice offered to / list presented to : Patient  Discharge Placement                       Discharge Plan and Services                                     Social Determinants of Health (SDOH) Interventions     Readmission Risk Interventions Readmission Risk Prevention Plan 11/27/2020 11/26/2020 06/14/2020  Transportation Screening - Complete Complete  PCP or Specialist Appt within 3-5 Days - - -  HRI or Manor Work Consult for [place] - - -  Medication Review Press photographer) - Complete Complete  PCP or Specialist appointment within 3-5 days of discharge Complete Complete (No Data)  [place] or [place] - Complete Complete  SW Recovery Care/Counseling Consult - Complete Complete  Palliative Care Screening - Not Applicable Not Applicable  Skilled Nursing Facility - Not Applicable Not Applicable  Some recent data might be hidden

## 2020-11-27 NOTE — Progress Notes (Signed)
Patient given instructions to keep follow up appointments, when to return for worsening symptoms, IV taken out by nursing student, Heart Failure education given, & patient discharged via ambulatory with nursing student.

## 2020-11-28 ENCOUNTER — Inpatient Hospital Stay (HOSPITAL_BASED_OUTPATIENT_CLINIC_OR_DEPARTMENT_OTHER): Payer: Medicaid Other | Admitting: Internal Medicine

## 2020-11-28 ENCOUNTER — Encounter: Payer: Self-pay | Admitting: Internal Medicine

## 2020-11-28 ENCOUNTER — Inpatient Hospital Stay: Payer: Medicaid Other | Attending: Internal Medicine

## 2020-11-28 ENCOUNTER — Telehealth: Payer: Self-pay | Admitting: Cardiovascular Disease

## 2020-11-28 DIAGNOSIS — I5022 Chronic systolic (congestive) heart failure: Secondary | ICD-10-CM | POA: Insufficient documentation

## 2020-11-28 DIAGNOSIS — Z794 Long term (current) use of insulin: Secondary | ICD-10-CM | POA: Diagnosis not present

## 2020-11-28 DIAGNOSIS — N183 Chronic kidney disease, stage 3 unspecified: Secondary | ICD-10-CM | POA: Diagnosis not present

## 2020-11-28 DIAGNOSIS — Z87891 Personal history of nicotine dependence: Secondary | ICD-10-CM | POA: Diagnosis not present

## 2020-11-28 DIAGNOSIS — I2510 Atherosclerotic heart disease of native coronary artery without angina pectoris: Secondary | ICD-10-CM | POA: Insufficient documentation

## 2020-11-28 DIAGNOSIS — I2699 Other pulmonary embolism without acute cor pulmonale: Secondary | ICD-10-CM | POA: Insufficient documentation

## 2020-11-28 DIAGNOSIS — E1165 Type 2 diabetes mellitus with hyperglycemia: Secondary | ICD-10-CM | POA: Diagnosis not present

## 2020-11-28 DIAGNOSIS — E1122 Type 2 diabetes mellitus with diabetic chronic kidney disease: Secondary | ICD-10-CM | POA: Diagnosis not present

## 2020-11-28 DIAGNOSIS — Z7901 Long term (current) use of anticoagulants: Secondary | ICD-10-CM | POA: Diagnosis not present

## 2020-11-28 DIAGNOSIS — I130 Hypertensive heart and chronic kidney disease with heart failure and stage 1 through stage 4 chronic kidney disease, or unspecified chronic kidney disease: Secondary | ICD-10-CM | POA: Diagnosis not present

## 2020-11-28 LAB — COMPREHENSIVE METABOLIC PANEL
ALT: 21 U/L (ref 0–44)
AST: 25 U/L (ref 15–41)
Albumin: 4.5 g/dL (ref 3.5–5.0)
Alkaline Phosphatase: 57 U/L (ref 38–126)
Anion gap: 13 (ref 5–15)
BUN: 43 mg/dL — ABNORMAL HIGH (ref 6–20)
CO2: 27 mmol/L (ref 22–32)
Calcium: 9.5 mg/dL (ref 8.9–10.3)
Chloride: 97 mmol/L — ABNORMAL LOW (ref 98–111)
Creatinine, Ser: 1.61 mg/dL — ABNORMAL HIGH (ref 0.61–1.24)
GFR, Estimated: 51 mL/min — ABNORMAL LOW (ref >60)
Glucose, Bld: 312 mg/dL — ABNORMAL HIGH (ref 70–99)
Potassium: 3.7 mmol/L (ref 3.5–5.1)
Sodium: 137 mmol/L (ref 135–145)
Total Bilirubin: 1.1 mg/dL (ref 0.3–1.2)
Total Protein: 8.3 g/dL — ABNORMAL HIGH (ref 6.5–8.1)

## 2020-11-28 LAB — CBC WITH DIFFERENTIAL/PLATELET
Abs Immature Granulocytes: 0.04 10*3/uL (ref 0.00–0.07)
Basophils Absolute: 0.1 10*3/uL (ref 0.0–0.1)
Basophils Relative: 1 %
Eosinophils Absolute: 0.2 10*3/uL (ref 0.0–0.5)
Eosinophils Relative: 2 %
HCT: 50.7 % (ref 39.0–52.0)
Hemoglobin: 17.1 g/dL — ABNORMAL HIGH (ref 13.0–17.0)
Immature Granulocytes: 0 %
Lymphocytes Relative: 27 %
Lymphs Abs: 2.4 10*3/uL (ref 0.7–4.0)
MCH: 27.7 pg (ref 26.0–34.0)
MCHC: 33.7 g/dL (ref 30.0–36.0)
MCV: 82 fL (ref 80.0–100.0)
Monocytes Absolute: 0.6 10*3/uL (ref 0.1–1.0)
Monocytes Relative: 7 %
Neutro Abs: 5.7 10*3/uL (ref 1.7–7.7)
Neutrophils Relative %: 63 %
Platelets: 172 10*3/uL (ref 150–400)
RBC: 6.18 MIL/uL — ABNORMAL HIGH (ref 4.22–5.81)
RDW: 13.2 % (ref 11.5–15.5)
WBC: 9 10*3/uL (ref 4.0–10.5)
nRBC: 0 % (ref 0.0–0.2)

## 2020-11-28 NOTE — Telephone Encounter (Signed)
Patient is at cancer center giving blood Would like to discuss if he still needs our blood work completed  Please call to discuss - requesting to speak with Jeannene Patella

## 2020-11-28 NOTE — Telephone Encounter (Signed)
Spoke with patient and reviewed that I did not see any labs that were needed. Advised that some labs were done today and that I do not see any that we needed at this time. He inquired about upcoming appointment and confirmed date and time. He was appreciative for the call with no further questions at this time.

## 2020-11-28 NOTE — Telephone Encounter (Signed)
Patient called to let pam know he walked out of cancer center appt and doesn't want to come back.

## 2020-11-28 NOTE — Progress Notes (Signed)
[name] NOTE  Patient Care Team: Brita Romp Dionne Bucy, MD as PCP - General (Family Medicine) Rockey Situ Kathlene November, MD as PCP - Cardiology (Cardiology) Alisa Graff, FNP as Nurse Practitioner (Family Medicine) Minna Merritts, MD as Consulting Physician (Cardiology)  CHIEF COMPLAINTS/PURPOSE OF CONSULTATION: DVT/PE  # MAY 2021-acute PE right upper lower lobe/ right lower extremity DVT-unprovoked on xarelto x stopped 2 months [cost/insurance issues]; October 2021- Right lobar Artery- on xarelto   # CAD; chronic systolic CHF (EF of 35 to 40% in October 2021; Dr.Gollan/Dr.Klein; [place]) ; HTN; DM-2 [NOV 2021-A1c- 9.8; poorly controlled]; CKD- stage-III; smoker- trying to cut down [OCT 2021];   Oncology History   No history exists.     HISTORY OF PRESENTING ILLNESS:  Gabriel [name]. 50 y.o.  male with history of multiple medical problems including CAD CHF hypertension hyperlipidemia type 2 diabetes poorly controlled CKD-in the recent right lower extremity DVT/PE currently on xarelto is here for follow-up.  Patient was admitted to hospital for acute renal failure with creatinine up to 3; which was thought to be from overdiuresis.  Patient treated with IV fluids; discharged home yesterday.  Patient continues to be on Xarelto.  Patient denies any blood in stools or black-colored stools.  Denies any nausea vomiting.  As per the lab staff -patient was reluctant with the labs.  However in the end did have the labs done.   Review of Systems  Constitutional: Positive for malaise/fatigue. Negative for chills, diaphoresis, fever and weight loss.  HENT: Negative for nosebleeds and sore throat.   Eyes: Negative for double vision.  Respiratory: Negative for cough, hemoptysis, sputum production, shortness of breath and wheezing.   Cardiovascular: Negative for chest pain, palpitations, orthopnea and leg swelling.  Gastrointestinal: Negative for abdominal pain, blood in  stool, constipation, diarrhea, heartburn, melena, nausea and vomiting.  Genitourinary: Negative for dysuria, frequency and urgency.  Musculoskeletal: Positive for back pain and joint pain.  Skin: Negative.  Negative for itching and rash.  Neurological: Negative for dizziness, tingling, focal weakness, weakness and headaches.  Endo/Heme/Allergies: Does not bruise/bleed easily.  Psychiatric/Behavioral: Negative for depression. The patient is not nervous/anxious and does not have insomnia.      MEDICAL HISTORY:  Past Medical History:  Diagnosis Date  . Chest wall pain, chronic   . Chronic systolic CHF (congestive heart failure) ([place])   . Chronic Troponin Elevation   . CKD (chronic kidney disease), stage II-III   . COPD (chronic obstructive pulmonary disease) ([place])   . Diabetes mellitus without complication ([place])   . Hypertension    Resolved since weight loss  . Myocardial infarct ([place])   . NICM (nonischemic cardiomyopathy) ([place])    a. 03/2015 Echo: EF 45-50%; b. 12/2015 Echo: EF 20-25%; c. 02/2016 Echo: EF 30-35%; d. 11/2016 Echo: EF 40-45%; e. 06/2019 Echo: EF 30-35%; f. 11/2019 Echo: EF 25-30%, glob HK. Mild LVH. Gr2 DD. Mild-mod AI. BAE. Triv MR.  . Non-obstructive CAD (coronary artery disease)    a. 04/2015 low risk MV;  b. 12/2016 Cath: minor irregs in LAD/Diag/LCX/OM, RCA 40p/m/d.  [name] (nonsustained ventricular tachycardia) ([place])    a. 12/2015 noted on tele-->amio;  b. 12/2015 Event monitor: no VT. Atach noted.  . Obesity (BMI 30.0-34.9)   . Psoriasis   . Syncope    a. 01/2016 - felt to be vasovagal.  . Tobacco abuse     SURGICAL HISTORY: Past Surgical History:  Procedure Laterality Date  . AMPUTATION    .  CARDIAC CATHETERIZATION    . FINGER AMPUTATION     Traumatic  . FINGER FRACTURE SURGERY Left   . LEFT HEART CATH AND CORONARY ANGIOGRAPHY N/A 01/06/2017   Procedure: Left Heart Cath and Coronary Angiography;  Surgeon: Wellington Hampshire, MD;  Location: [facility];   Service: Cardiovascular;  Laterality: N/A;  . RIGHT/LEFT HEART CATH AND CORONARY ANGIOGRAPHY N/A 06/13/2020   Procedure: RIGHT/LEFT HEART CATH AND CORONARY ANGIOGRAPHY;  Surgeon: Nelva Bush, MD;  Location: [facility];  Service: Cardiovascular;  Laterality: N/A;    SOCIAL HISTORY: Social History   Socioeconomic History  . Marital status: Widowed    Spouse name: Not on file  . Number of children: 1  . Years of education: 66  . Highest education level: 12th grade  Occupational History  . Occupation: disabled  Tobacco Use  . Smoking status: Former Smoker    Packs/day: 0.50    Years: 33.00    Pack years: 16.50    Types: Cigarettes    Quit date: 05/08/2020    Years since quitting: 0.5  . Smokeless tobacco: Former Network engineer  . Vaping Use: Former  Substance and Sexual Activity  . Alcohol use: Yes    Alcohol/week: 0.0 standard drinks    Comment: occassionally  . Drug use: No  . Sexual activity: Not Currently  Other Topics Concern  . Not on file  Social History Narrative   Lives at home with wife and his dad's wife.        Works sales/advanced Academic librarian; smoker; cutting; hx of alcoholism [started 71 year]; quit at age of 31 years.    Social Determinants of Health   Financial Resource Strain: Not on file  Food Insecurity: Not on file  Transportation Needs: Not on file  Physical Activity: Not on file  Stress: Not on file  Social Connections: Not on file  Intimate Partner Violence: Not on file    FAMILY HISTORY: Family History  Problem Relation Age of Onset  . Diabetes Mellitus II Mother   . Hypothyroidism Mother   . Hypertension Mother   . Hypertension Father   . Gout Father   . Cancer Paternal Aunt   . Cancer Maternal Grandfather   . Diabetes Maternal Grandfather     ALLERGIES:  is allergic to metformin and related, prednisone, and zantac [ranitidine hcl].  MEDICATIONS:  Current Outpatient Medications  Medication Sig Dispense Refill  .  Insulin Glargine (BASAGLAR KWIKPEN) 100 UNIT/ML Inject 28 Units into the skin at bedtime. 15 mL 2  . carvedilol (COREG) 6.25 MG tablet Take 1 tablet (6.25 mg total) by mouth 2 (two) times daily with a meal. (Patient not taking: Reported on 11/28/2020) 180 tablet 3  . cetirizine (ZYRTEC) 10 MG tablet Take 10 mg by mouth daily. (Patient not taking: Reported on 11/28/2020)    . COMBIVENT RESPIMAT 20-100 MCG/ACT AERS respimat Inhale 1 puff into the lungs 4 (four) times daily. (Patient not taking: Reported on 11/28/2020)    . fenofibrate (TRICOR) 145 MG tablet Take 1 tablet (145 mg total) by mouth daily. (Patient not taking: Reported on 11/28/2020) 30 tablet 6  . metolazone (ZAROXOLYN) 2.5 MG tablet Take 1 tablet (2.5 mg total) by mouth daily as needed (sob,leg edema). (Patient not taking: Reported on 11/28/2020) 15 tablet 0  . nitroGLYCERIN (NITROSTAT) 0.4 MG SL tablet Place 1 tablet (0.4 mg total) under the tongue every 5 (five) minutes x 3 doses as needed for chest pain. (Patient  not taking: Reported on 11/28/2020) 30 tablet 0  . rivaroxaban (XARELTO) 20 MG TABS tablet Take 1 tablet (20 mg total) by mouth daily with supper. (Patient not taking: Reported on 11/28/2020) 30 tablet 0  . rosuvastatin (CRESTOR) 20 MG tablet Take 1 tablet (20 mg total) by mouth at bedtime. (Patient not taking: Reported on 11/28/2020) 30 tablet 6  . torsemide (DEMADEX) 20 MG tablet Take 1 tablet (20 mg total) by mouth daily. (Patient not taking: Reported on 11/28/2020) 30 tablet 0  . TRULICITY 5.40 GQ/6.7YP SOPN INJECT 0.75 MG INTO THE SKIN ONCE A WEEK. (Patient not taking: Reported on 11/28/2020) 2 mL 2   No current facility-administered medications for this visit.      Marland Kitchen  PHYSICAL EXAMINATION:  Vitals:   11/28/20 1455  BP: (!) 144/87  Pulse: 84  Resp: 18  Temp: (!) 97 F (36.1 C)  SpO2: 97%   Filed Weights   11/28/20 1455  Weight: 239 lb (108.4 kg)    Physical Exam Constitutional:      Comments: Alone.  Ambulating  independently.  HENT:     Head: Normocephalic and atraumatic.     Mouth/Throat:     Pharynx: No oropharyngeal exudate.  Eyes:     Pupils: Pupils are equal, round, and reactive to light.  Cardiovascular:     Rate and Rhythm: Normal rate and regular rhythm.  Pulmonary:     Effort: No respiratory distress.     Breath sounds: No wheezing.     Comments: Decreased air entry bilaterally. Abdominal:     General: Bowel sounds are normal. There is no distension.     Palpations: Abdomen is soft. There is no mass.     Tenderness: There is no abdominal tenderness. There is no guarding or rebound.  Musculoskeletal:        General: No tenderness. Normal range of motion.     Cervical back: Normal range of motion and neck supple.  Skin:    General: Skin is warm.  Neurological:     Mental Status: He is alert and oriented to person, place, and time.  Psychiatric:        Mood and Affect: Affect normal.      LABORATORY DATA:  I have reviewed the data as listed Lab Results  Component Value Date   WBC 9.0 11/28/2020   HGB 17.1 (H) 11/28/2020   HCT 50.7 11/28/2020   MCV 82.0 11/28/2020   PLT 172 11/28/2020   Recent Labs    05/12/20 1227 05/13/20 0529 05/14/20 0505 06/07/20 1144 06/07/20 1831 06/08/20 0420 10/27/20 0739 10/28/20 0553 11/23/20 1345 11/24/20 0433 11/26/20 0228 11/27/20 0556 11/28/20 1407  NA 140 139 139   < >  --    < > 139   < > 134*   < > 133* 136 137  K 4.4 3.4* 3.6   < >  --    < > 3.9   < > 3.7   < > 4.2 3.6 3.7  CL 104 101 101   < >  --    < > 106   < > 94*   < > 95* 96* 97*  CO2 25 26 27    < >  --    < > 23   < > 26   < > 27 26 27   GLUCOSE 270* 267* 203*   < >  --    < > 282*   < > 300*   < > 197*  155* 312*  BUN 17 17 27*   < >  --    < > 22*   < > 50*   < > 45* 50* 43*  CREATININE 1.11 1.16 1.18   < >  --    < > 1.22   < > 2.30*   < > 1.74* 1.69* 1.61*  CALCIUM 9.0 9.0 9.3   < >  --    < > 9.3   < > 10.5*   < > 10.4* 9.7 9.5  GFRNONAA >60 >60 >60   < >  --     < > >60   < > 34*   < > 47* 49* 51*  GFRAA >60 >60 >60  --   --   --   --   --   --   --   --   --   --   PROT  --   --   --   --  6.3*   < > 7.0  --  8.0  --   --   --  8.3*  ALBUMIN  --   --   --   --  3.2*   < > 3.7  --  4.2  --   --   --  4.5  AST  --   --   --   --  25   < > 23  --  21  --   --   --  25  ALT  --   --   --   --  32   < > 22  --  19  --   --   --  21  ALKPHOS  --   --   --   --  46   < > 57  --  67  --   --   --  57  BILITOT  --   --   --   --  2.2*   < > 1.3*  --  1.2  --   --   --  1.1  BILIDIR  --   --   --   --  0.3*  --   --   --  0.1  --   --   --   --   IBILI  --   --   --   --  1.9*  --   --   --  1.1*  --   --   --   --    < > = values in this interval not displayed.    RADIOGRAPHIC STUDIES: I have personally reviewed the radiological images as listed and agreed with the findings in the report. US RENAL  Result Date: 11/26/2020 CLINICAL DATA:  Acute kidney injury EXAM: RENAL / URINARY TRACT ULTRASOUND COMPLETE COMPARISON:  Abdominal ultrasound Dec 23, 2019 FINDINGS: Right Kidney: Renal measurements: 12.3 x 5.6 x 6.1 cm = volume: 227.9 mL. Echogenicity and renal cortical thickness are within normal limits. No mass, perinephric fluid, or hydronephrosis visualized. No sonographically demonstrable calculus or ureterectasis. Left Kidney: Renal measurements: 10.6 x 5.3 x 4.7 cm = volume: 137.4 mL. Echogenicity and renal cortical thickness are within normal limits. No perinephric fluid or hydronephrosis visualized. There is a cyst in the mid right kidney measuring 1.3 x 1.3 x 1.1 cm. No sonographically demonstrable calculus or ureterectasis. Bladder: Appears normal for degree of bladder distention. Other: None. IMPRESSION: Cyst mid left kidney measuring 1.3 x 1.3 x 1.1  cm. Study otherwise unremarkable. Electronically Signed   By: Lowella Grip III M.D.   On: 11/26/2020 10:22   DG Chest Port 1 View  Result Date: 11/25/2020 CLINICAL DATA:  Dizziness and weakness. EXAM:  PORTABLE CHEST 1 VIEW COMPARISON:  11/23/2020 FINDINGS: 1344 hours. Low volume film. Cardiopericardial silhouette is at upper limits of normal for size. There is pulmonary vascular congestion without overt pulmonary edema. Streaky opacity in the left parahilar lung compatible with atelectasis. No overt edema, focal consolidation, or pleural effusion. The visualized bony structures of the thorax show no acute abnormality. Telemetry leads overlie the chest. IMPRESSION: Low volume film with pulmonary vascular congestion. Electronically Signed   By: Misty Stanley M.D.   On: 11/25/2020 14:22   DG Chest Portable 1 View  Result Date: 11/23/2020 CLINICAL DATA:  Cough EXAM: PORTABLE CHEST 1 VIEW COMPARISON:  October 27, 2020 FINDINGS: Lungs are clear. Heart is borderline enlarged with pulmonary vascularity normal. No adenopathy. No bone lesions. IMPRESSION: Lungs clear.  Heart borderline enlarged. Electronically Signed   By: Lowella Grip III M.D.   On: 11/23/2020 14:33   SLEEP STUDY DOCUMENTS  Result Date: 10/31/2020 Ordered by an unspecified provider.   ASSESSMENT & PLAN:   Pulmonary embolism on right ([place]) # [id number] [May 0177]- on eliquis x 2 months; noncompliance sec to costs/insurance; September 2021-acute PE.  Patient currently compliant on xarelto.   #Discussed with patient that given the elevated creatinine/the recent acute renal failure-although Xarelto 20 mg is currently reasonable [GFR 51]; however would recommend switching over to Eliquis 5 mg twice daily.  Patient will likely need indefinite anticoagulation.  #Congestive heart failure-on diuretics; acute on chronic renal failure-defer to PCP/cardiology.   #I reviewed with the patient the results of the blood work [facility]; chemistries] at length to which patient raises concerned about veracity of the results.  The patient accused me of "Bullshitting". Quite surprised of the patient's accusation I inquired the patient why he felt so.  He  states "the turnaround time for the labs in the hospital was 2-3 hours whereas in our clinic was 10 to 15 minutes".  I made it clear to the patient that I would not ever distort the facts.  Given the lack of trust/baseless accusation, patient will not follow-up with me in the clinic.  He is free to choose different provider; or facility.  Patient walked out of the office.   # DISPOSITION: # NO follow up with me- Dr.B  All questions were answered. The patient knows to call the clinic with any problems, questions or concerns.    Gabriel Sickle, MD 11/28/2020 4:52 PM

## 2020-11-28 NOTE — Progress Notes (Signed)
Patient here for oncology follow-up appointment, Patient stated the hospital MD took him off all meds expect insulin, patient will update med list next apt.

## 2020-11-28 NOTE — Assessment & Plan Note (Addendum)
#   UNPROVOKED-PE [May 2426]- on eliquis x 2 months; noncompliance sec to costs/insurance; September 2021-acute PE.  Patient currently compliant on xarelto.   #Discussed with patient that given the elevated creatinine/the recent acute renal failure-although Xarelto 20 mg is currently reasonable [GFR 51]; however would recommend switching over to Eliquis 5 mg twice daily.  Patient will likely need indefinite anticoagulation.  #Congestive heart failure-on diuretics; acute on chronic renal failure-defer to PCP/cardiology.   #I reviewed with the patient the results of the blood work [facility]; chemistries] at length to which patient raises concerned about veracity of the results.  The patient accused me of "Bullshitting". Quite surprised of the patient's accusation I inquired the patient why he felt so.  He states "the turnaround time for the labs in the hospital was 2-3 hours whereas in our clinic was 10 to 15 minutes".  I made it clear to the patient that I would not ever distort the facts.  Given the lack of trust/baseless accusation, patient will not follow-up with me in the clinic.  He is free to choose different provider; or facility.  Patient walked out of the office.   # DISPOSITION: # NO follow up with me- Dr.B

## 2020-11-30 ENCOUNTER — Other Ambulatory Visit: Payer: Self-pay | Admitting: *Deleted

## 2020-11-30 ENCOUNTER — Telehealth: Payer: Self-pay | Admitting: Cardiovascular Disease

## 2020-11-30 ENCOUNTER — Encounter: Payer: Self-pay | Admitting: Nurse Practitioner

## 2020-11-30 ENCOUNTER — Other Ambulatory Visit: Payer: Self-pay

## 2020-11-30 ENCOUNTER — Ambulatory Visit (INDEPENDENT_AMBULATORY_CARE_PROVIDER_SITE_OTHER): Payer: Medicaid Other | Admitting: Nurse Practitioner

## 2020-11-30 VITALS — BP 110/70 | HR 93 | Ht 72.0 in | Wt 237.1 lb

## 2020-11-30 DIAGNOSIS — N183 Chronic kidney disease, stage 3 unspecified: Secondary | ICD-10-CM | POA: Diagnosis not present

## 2020-11-30 DIAGNOSIS — I2510 Atherosclerotic heart disease of native coronary artery without angina pectoris: Secondary | ICD-10-CM

## 2020-11-30 DIAGNOSIS — E785 Hyperlipidemia, unspecified: Secondary | ICD-10-CM | POA: Diagnosis not present

## 2020-11-30 DIAGNOSIS — I10 Essential (primary) hypertension: Secondary | ICD-10-CM

## 2020-11-30 DIAGNOSIS — I5022 Chronic systolic (congestive) heart failure: Secondary | ICD-10-CM | POA: Diagnosis not present

## 2020-11-30 DIAGNOSIS — I428 Other cardiomyopathies: Secondary | ICD-10-CM

## 2020-11-30 DIAGNOSIS — I5042 Chronic combined systolic (congestive) and diastolic (congestive) heart failure: Secondary | ICD-10-CM

## 2020-11-30 MED ORDER — ENTRESTO 49-51 MG PO TABS: 0.5000 | ORAL_TABLET | Freq: Two times a day (BID) | ORAL | 6 refills | Status: DC

## 2020-11-30 NOTE — Patient Instructions (Signed)
Medication Instructions:  Your physician has recommended you make the following change in your medication:   1. STOP Metolazone 2. START Entresto 49-51 mg half tablet twice a day   *If you need a refill on your cardiac medications before your next appointment, please call your pharmacy*   Lab Work: BMET next week at the [facility]. Check in at registration and they will direct you where to go.  If you have labs (blood work) drawn today and your tests are completely normal, you will receive your results only by: Marland Kitchen MyChart Message (if you have MyChart) OR . A paper copy in the mail If you have any lab test that is abnormal or we need to change your treatment, we will call you to review the results.   Testing/Procedures: None   Follow-Up: At [facility], you and your health needs are our priority.  As part of our continuing mission to provide you with exceptional heart care, we have created designated Provider Care Teams.  These Care Teams include your primary Cardiologist (physician) and Advanced Practice Providers (APPs -  Physician Assistants and Nurse Practitioners) who all work together to provide you with the care you need, when you need it.  We recommend signing up for the patient portal called "MyChart".  Sign up information is provided on this After Visit Summary.  MyChart is used to connect with patients for Virtual Visits (Telemedicine).  Patients are able to view lab/test results, encounter notes, upcoming appointments, etc.  Non-urgent messages can be sent to your provider as well.   To learn more about what you can do with MyChart, go to [email or web address].    Your next appointment:   2 week(s)  The format for your next appointment:   In Person  Provider:   [name], MD or Murray Hodgkins, NP

## 2020-11-30 NOTE — Telephone Encounter (Signed)
Call transferred directly to triage when the patient called in this afternoon. He reports he was seen in clinic this morning and felt fine. He left the office, and went to work.  At work, he has developed dizziness/ lightheaded like he experienced previously when going to the ER.   He works at [employer].  I inquired if symptoms started when he was squatting and standing and he confirmed this was correct.   He is currently on his way home to check his BP.   The patient reports he took his insulin & torsemide this morning prior to his appointment.   I inquired if has taken a dose of Entresto this afternoon and he has not.   He did arrive home and check his BP- this is currently 125/82.  I have advised the patient I would like for him to take some orthostatic BP/HR readings for me. I have asked him to lay for 10 minutes prior to starting this. I have explained to him how to check these orthostatic readings.   The patient voices understanding and is agreeable.   Will review with Ignacia Bayley, NP and call the patient back.

## 2020-11-30 NOTE — Telephone Encounter (Signed)
Left voicemail message to call back  

## 2020-11-30 NOTE — Telephone Encounter (Signed)
STAT if patient feels like he/she is going to faint   1) Are you dizzy now? yes  2) Do you feel faint or have you passed out? no  3) Do you have any other symptoms? Lightheaded, sweaty  4) Have you checked your HR and BP (record if available)? no

## 2020-11-30 NOTE — Progress Notes (Signed)
Office Visit    Patient Name: Gabriel [name]. 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   Past Medical History    Past Medical History:  Diagnosis Date  . CAD (coronary artery disease)    a. 04/2015 low risk MV;  b. 12/2016 Cath: minor irregs in LAD/Diag/LCX/OM, RCA 40p/m/d; c. 05/2020 Cath: LM nl, LAD 50d, LCX 30p, OM1 40, RCA 100p w/ L->L collats. CO/CI 3.1/1.3-->Med rx.  . Chest wall pain, chronic   . Chronic systolic CHF (congestive heart failure) ([place])   . Chronic Troponin Elevation   . CKD (chronic kidney disease), stage II-III   . COPD (chronic obstructive pulmonary disease) ([place])   . Diabetes mellitus without complication ([place])   . Hypertension    Resolved since weight loss  . Mixed Ischemic & NICM (nonischemic cardiomyopathy) ([place])    a. 03/2015 Echo: EF 45-50%; b. 12/2015 Echo: EF 20-25%; c. 02/2016 Echo: EF 30-35%; d. 11/2016 Echo: EF 40-45%; e. 06/2019 Echo: EF 30-35%; f. 11/2019 Echo: EF 25-30%; g. 05/2020 Echo: E f35-40%, glob HK, nl RV size/fxn, PASP 44.71mmHg, mod dil LA. Mild to mod MR.  . Myocardial infarct ([place])   . NSVT (nonsustained ventricular tachycardia) ([place])    a. 12/2015 noted on tele-->amio;  b. 12/2015 Event monitor: no VT. Atach noted.  . Obesity (BMI 30.0-34.9)   . Psoriasis   . Syncope    a. 01/2016 - felt to be vasovagal.  . Tobacco abuse    Past Surgical History:  Procedure Laterality Date  . AMPUTATION    . CARDIAC CATHETERIZATION    . FINGER AMPUTATION     Traumatic  . FINGER FRACTURE SURGERY Left   . LEFT HEART CATH AND CORONARY ANGIOGRAPHY N/A 01/06/2017    Procedure: Left Heart Cath and Coronary Angiography;  Surgeon: Wellington Hampshire, MD;  Location: [facility];  Service: Cardiovascular;  Laterality: N/A;  . RIGHT/LEFT HEART CATH AND CORONARY ANGIOGRAPHY N/A 06/13/2020   Procedure: RIGHT/LEFT HEART CATH AND CORONARY ANGIOGRAPHY;  Surgeon: Nelva Bush, MD;  Location: [facility];  Service: Cardiovascular;  Laterality: N/A;   Allergies  Allergies  Allergen Reactions  . Metformin And Related Nausea And Vomiting  . Prednisone Other (See Comments)    Reaction: Hallucinations    . 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 He has a complex cardiac history that includes multiple hospitalizations for management of chronic HFrEF, chronic chest pain, and chronically elevated troponin, with nonischemic cardiomyopathy indicated on nuclear stress test in 2016. He had previously been on amiodarone for NSVT seen on holter monitor, but was eventually weaned off. In Oct 2021, he was admitted w/ chest pain and elevated HsTrop above prior baseline. Cath revealed a CTO of the RCA w/ L  R  collaterals.  He was medically managed and has since been followed intermittently in the advanced CHF clinic in [place].  He was last seen in our clinic on 10/28/20, following hospital admission for chest pain and elevated troponin w/ trend in line w/ prior mild HsTrop elevations.  He was medically managed.  On 4/6, he presented to the [facility] for evaluation of near syncope. He was at work when he bent over to pick something up, felt very lightheaded when he stood up, and had to sit to recover from dizziness. He was found  to have an elevated creatinine at 2.44 (baseline 1.2) and advised by Dr. Garen Lah to decrease torsemide to 40 mg daily. He's not sure why he might have been dehydrated as he hadn't recently changed his activities or fluid intake.  He was discharged home but the following day, he returned to the ED with weakness and was found to be hypotensive. He was subsequently admitted for AKI, dyspnea, and chronic HFpEF. He was discharged on 4/11 and advised to hold Entresto and spironolactone 2/2 AKI. He resumed torsemide at 20 mg daily. Repeat creatinine on 4/12 was 1.61.   Today, he presents for hospital follow-up. He reports that he feels fatigued and has gained 5 lbs by home scale since d/c. He continues to take torsemide 20 mg daily and although metolazone is on his med list, he has never taken it. He does not monitor his fluid intake and admits to drinking a large amount of water. He has chronic dyspnea and chest pain but these symptoms are stable and he denies orthopnea, pnd, edema, early satiety, palpitations, or lightheadedness. He has not returned to work but feels like he would be able to complete his job responsibilities. He was recently diagnosed with OSA requiring CPAP and has gotten his equipment. He is concerned about being off his heart failure medications.   Home Medications    Prior to Admission medications   Medication Sig Start Date End Date Taking? Authorizing Provider  carvedilol (COREG) 6.25 MG tablet Take 1 tablet (6.25 mg total) by mouth 2 (two) times daily with a meal. 08/02/20  Yes Larey Dresser, MD  cetirizine (ZYRTEC) 10 MG tablet Take 10 mg by mouth daily.   Yes [provider]  COMBIVENT RESPIMAT 20-100 MCG/ACT AERS respimat Inhale 1 puff into the lungs 4 (four) times daily. 10/26/20  Yes [provider]  fenofibrate (TRICOR) 145 MG tablet Take 1 tablet (145 mg total) by mouth daily. 06/09/20  Yes Lorella Nimrod, MD  Insulin Glargine (BASAGLAR KWIKPEN) 100  UNIT/ML Inject 28 Units into the skin at bedtime. 06/09/20  Yes Lorella Nimrod, MD  nitroGLYCERIN (NITROSTAT) 0.4 MG SL tablet Place 1 tablet (0.4 mg total) under the tongue every 5 (five) minutes x 3 doses as needed for chest pain. 06/09/20  Yes Lorella Nimrod, MD  rivaroxaban (XARELTO) 20 MG TABS tablet Take 1 tablet (20 mg total) by mouth daily with supper. 11/07/20  Yes Cammie Sickle, MD  rosuvastatin (CRESTOR) 20 MG tablet Take 1 tablet (20 mg total) by mouth at bedtime. 06/09/20  Yes Lorella Nimrod, MD  sacubitril-valsartan (ENTRESTO) 49-51 MG Take 0.5 tablets by mouth 2 (two) times daily. 11/30/20  Yes Theora Gianotti, NP  torsemide (DEMADEX) 20 MG tablet Take 1 tablet (20 mg total) by mouth daily. 11/27/20  Yes Dessa Phi, DO  TRULICITY 3.79 KW/4.0XB SOPN INJECT 0.75 MG INTO THE SKIN ONCE A WEEK. 09/28/20  Yes Trinna Post, PA-C  Review of Systems    He reports weight gain (5 lb on home scale), mild abdominal distention. Denies chest pain, dyspnea, edema, orthopnea, pnd, syncope, lightheadedness, palpitations, or early satiety.  All other systems reviewed and are otherwise negative except as noted above.  Physical Exam    VS:  BP 110/70 (BP Location: Left Arm, Patient Position: Sitting, Cuff Size: Normal)   Pulse 93   Ht 6' (1.829 m)   Wt 237 lb 2 oz (107.6 kg)   SpO2 95%   BMI 32.16 kg/m  , BMI Body mass index is 32.16 kg/m. GEN: Obese, well nourished, in no acute distress. HEENT: normal. Neck: Supple, no JVD, carotid bruits, or masses. Cardiac: RRR, no murmurs, rubs, or gallops. No clubbing, cyanosis, edema.  Radials/PT 2+ and equal bilaterally.  Respiratory:  Respirations regular and unlabored, clear to auscultation bilaterally. GI: Slightly firm, nontender, nondistended, BS + x 4. MS: no deformity or atrophy. Skin: warm and dry, no rash. Neuro:  Strength and sensation are intact. Psych: Normal affect.  Accessory Clinical Findings    ECG  personally reviewed by me today - RSR @ 93 bpm, left atrial enlargement, mild lat ST depression, nonspecific TWI  - no acute changes.  Lab Results  Component Value Date   WBC 9.0 11/28/2020   HGB 17.1 (H) 11/28/2020   HCT 50.7 11/28/2020   MCV 82.0 11/28/2020   PLT 172 11/28/2020   Lab Results  Component Value Date   CREATININE 1.61 (H) 11/28/2020   BUN 43 (H) 11/28/2020   NA 137 11/28/2020   K 3.7 11/28/2020   CL 97 (L) 11/28/2020   CO2 27 11/28/2020   Lab Results  Component Value Date   ALT 21 11/28/2020   AST 25 11/28/2020   ALKPHOS 57 11/28/2020   BILITOT 1.1 11/28/2020   Lab Results  Component Value Date   CHOL 185 11/23/2020   HDL 19 (L) 11/23/2020   LDLCALC UNABLE TO CALCULATE IF TRIGLYCERIDE OVER 400 mg/dL 11/23/2020   LDLDIRECT 60.6 11/23/2020   TRIG 900 (H) 11/23/2020   CHOLHDL 9.7 11/23/2020    Lab Results  Component Value Date   HGBA1C 7.1 (H) 10/27/2020    Assessment & Plan    1.  Acute on Chronic HFrEF/mixed ischemic and nonischemic cardiomyopathy: Echo 06/08/20 LVEF 35-40%, LV global hypokinesis, mildly elev PASP, LAE, mild to moderate MR. He was previously on GDMT including Entresto 49-51 mg bid, spironolactone, torsemide,  blocker, GLP1 agonist, and has been followed by Dr. Aundra Dubin with [facility] HF clinic. He had a recent hospitalization following new onset of weakness and an episode of pre-syncope and was found to have AKI with no obvious reason such as GI bug or profuse sweating to explain volume depletion. Diuretics and CHF meds initially held but torsemide resumed in the setting of volume overload following IV hydration.  He was discharged on torsemide 20 mg and carvedilol, while spironolactone and entresto continue to be on hold. His previous torsemide dose was 60 mg daily prior to hospitalization on 4/6. Since being home, he reports weight gain of 5 lbs by his scale. He is not monitoring fluid intake but drinks mostly water and admits to drinking a large  amount. Metolazone is on his medicine list but he reports he has never gotten a Rx. We will remove it from his list. He expresses concern that being off entresto will damage his heart. His BP here is somewhat soft, however he reports systolic of 326 at home. He  denies dypsnea but has a somewhat firm abdomen; no lower extremity edema. We discussed slowly resuming medications in light of his recent hospitalization. I will add back in low dose Entresto at 24-26 bid and continue torsemide 20 daily. We will follow closely with bmet in 1 week and return office visit in 2 weeks.   2. CAD/Chronic chest pain: H/o NICM and nonobs dzs but cath in 05/2020 revealed CTO of the RCA w/ L  R collaterals.  Recent admission in March w/ chest pain and stable, mildly elevated HsT.  No concerns for worsening angina @ this time. He is interested in returning to cardiac rehab so that he can start exercising. Continue  blocker, statin, fibrate rx.  No ASA in the setting of chronic xarelto.  3. CKD Stage 3: His creatinine on 4/12 was 1.61, down from 2.44 on 4/6. In the setting of a 5 lb weight gain and HFrEF, we will add back lowest dose of Entresto @ 24-26 mg bid  continue torsemide 20 mg daily. Will check bmet in 1 week and see him back in 2 weeks. Advised him to call back sooner if weight continues to increase.   4. Essential hypertension: BP is a little soft today at 110/70 in the office but he reports higher readings at home of 300 systolic. He would like to resume Entresto, and this seems reasonable, so we will restart at 24-26 mg bid (he was previously on 49-51).  Advised him to continue to monitor BP and to call if he gets consistent systolic readings <76 or if he has symptoms of orthostasis. Continue  blocker, diuretic.   5. Chronic anticoagulation for unprovoked PE: Currently on Xarelto without concerns for bleeding. Management per hem/onc.  H/H stable.  6. HL/hypertriglyceridemia:  On statin/tricor.  TG markedly  elevated @ 900 recently - ? Fasting.  ? complicance as numbers have been better in the past.  7.  Disposition: Adding back in lowest dose Entresto (24-26 mg bid), continue torsemide at 20 daily. BMET in 1 week and follow-up office visit in 2 weeks. Referral to cardiac rehab.   Murray Hodgkins, NP 11/30/2020, 2:47 PM

## 2020-11-30 NOTE — Telephone Encounter (Signed)
I spoke with Ignacia Bayley, NP prior to calling the patient back.  I have reviewed the patient's symptoms as stated below. Per Gerald Stabs, NP- the patient should not resume entresto at this time. He is due for a repeat BMP in 1 week and follow up in 2 weeks, but may need to move up his appointment to next week.  I advised I was having the patient check orthostatic BP readings and I will call to follow up on these and advise him of NP recommendations.  I was able to call the patient back. He had obtained his orthostatic BP readings.  After laying for 10 minutes: Lying BP (HR)- 111/77 (80) Sitting BP (HR)- 119/78 (86) Standing BP (HR)- 122/86 (93) Standing BP (HR) @ 3 minutes- 117/86 (92)  I have advised him to hold off on starting his entresto.  I have offered him a follow up appointment next week with Ignacia Bayley, NP on 4/21 @ 8:00 am. The patient is agreeable.  I have also advised him if symptoms of dizziness/ lightheadness are worse at work with constant change in position, he could think about wearing lower extremity/ abdominal compression to see if this helps. He is advised to stay hydrated as well.  The patient voices understanding of the above recommendations and is agreeable.

## 2020-12-04 ENCOUNTER — Other Ambulatory Visit
Admission: RE | Admit: 2020-12-04 | Discharge: 2020-12-04 | Disposition: A | Discharge status: Home / Self Care | Payer: Medicaid Other | Attending: Nurse Practitioner | Admitting: Nurse Practitioner

## 2020-12-04 DIAGNOSIS — I2510 Atherosclerotic heart disease of native coronary artery without angina pectoris: Secondary | ICD-10-CM | POA: Diagnosis not present

## 2020-12-04 DIAGNOSIS — I5022 Chronic systolic (congestive) heart failure: Secondary | ICD-10-CM | POA: Diagnosis present

## 2020-12-04 LAB — BASIC METABOLIC PANEL
Anion gap: 13 (ref 5–15)
BUN: 38 mg/dL — ABNORMAL HIGH (ref 6–20)
CO2: 29 mmol/L (ref 22–32)
Calcium: 9.7 mg/dL (ref 8.9–10.3)
Chloride: 93 mmol/L — ABNORMAL LOW (ref 98–111)
Creatinine, Ser: 1.57 mg/dL — ABNORMAL HIGH (ref 0.61–1.24)
GFR, Estimated: 53 mL/min — ABNORMAL LOW (ref >60)
Glucose, Bld: 291 mg/dL — ABNORMAL HIGH (ref 70–99)
Potassium: 3.7 mmol/L (ref 3.5–5.1)
Sodium: 135 mmol/L (ref 135–145)

## 2020-12-05 LAB — BLOOD GAS, VENOUS
Acid-Base Excess: 8.8 mmol/L — ABNORMAL HIGH (ref 0.0–2.0)
Bicarbonate: 33.5 mmol/L — ABNORMAL HIGH (ref 20.0–28.0)
O2 Saturation: 86.9 %
Patient temperature: 37
pCO2, Ven: 44 mmHg (ref 44.0–60.0)
pH, Ven: 7.49 — ABNORMAL HIGH (ref 7.250–7.430)
pO2, Ven: 48 mmHg — ABNORMAL HIGH (ref 32.0–45.0)

## 2020-12-07 ENCOUNTER — Ambulatory Visit (INDEPENDENT_AMBULATORY_CARE_PROVIDER_SITE_OTHER): Payer: Medicaid Other | Admitting: Nurse Practitioner

## 2020-12-07 ENCOUNTER — Encounter: Payer: Self-pay | Admitting: Nurse Practitioner

## 2020-12-07 ENCOUNTER — Other Ambulatory Visit: Payer: Self-pay

## 2020-12-07 VITALS — BP 114/76 | HR 75 | Ht 72.0 in | Wt 233.0 lb

## 2020-12-07 DIAGNOSIS — I2510 Atherosclerotic heart disease of native coronary artery without angina pectoris: Secondary | ICD-10-CM

## 2020-12-07 DIAGNOSIS — I10 Essential (primary) hypertension: Secondary | ICD-10-CM | POA: Diagnosis not present

## 2020-12-07 DIAGNOSIS — E781 Pure hyperglyceridemia: Secondary | ICD-10-CM

## 2020-12-07 DIAGNOSIS — E785 Hyperlipidemia, unspecified: Secondary | ICD-10-CM | POA: Diagnosis not present

## 2020-12-07 DIAGNOSIS — N1831 Chronic kidney disease, stage 3a: Secondary | ICD-10-CM

## 2020-12-07 DIAGNOSIS — I428 Other cardiomyopathies: Secondary | ICD-10-CM

## 2020-12-07 DIAGNOSIS — I951 Orthostatic hypotension: Secondary | ICD-10-CM | POA: Diagnosis not present

## 2020-12-07 DIAGNOSIS — I5022 Chronic systolic (congestive) heart failure: Secondary | ICD-10-CM | POA: Diagnosis not present

## 2020-12-07 NOTE — Progress Notes (Signed)
Office Visit    Patient Name: Gabriel [name]. Date of Encounter: 12/07/2020  Primary Care Provider:  Virginia Crews, MD Primary Cardiologist:  [name], MD  Chief Complaint    50 year old male with a history of nonobstructive CAD, chronic chest pain, chronic troponin elevation, HFrEF, nonischemic cardiomyopathy, nonsustained VT, stage III chronic kidney disease, COPD, chronic anticoagulation secondary to unprovoked PE, type 2 diabetes mellitus, and obesity, presents for follow-up related to lightheadedness.  Past Medical History    Past Medical History:  Diagnosis Date  . CAD (coronary artery disease)    a. 04/2015 low risk MV;  b. 12/2016 Cath: minor irregs in LAD/Diag/LCX/OM, RCA 40p/m/d; c. 05/2020 Cath: LM nl, LAD 50d, LCX 30p, OM1 40, RCA 100p w/ L->L collats. CO/CI 3.1/1.3-->Med rx.  . Chest wall pain, chronic   . Chronic Troponin Elevation   . CKD (chronic kidney disease), stage II-III   . COPD (chronic obstructive pulmonary disease) ([place])   . Diabetes mellitus without complication ([place])   . HFrEF (heart failure with reduced ejection fraction) ([place])   . Hypertension    Resolved since weight loss  . Mixed Ischemic & NICM (nonischemic cardiomyopathy) ([place])    a. 03/2015 Echo: EF 45-50%; b. 12/2015 Echo: EF 20-25%; c. 02/2016 Echo: EF 30-35%; d. 11/2016 Echo: EF 40-45%; e. 06/2019 Echo: EF 30-35%; f. 11/2019 Echo: EF 25-30%; g. 05/2020 Echo: EF 35-40%, glob HK, nl RV size/fxn, PASP 44.54mmHg, mod dil LA. Mild to mod MR.  . Myocardial infarct ([place])   . NSVT (nonsustained ventricular tachycardia) ([place])    a. 12/2015 noted on tele-->amio;  b. 12/2015 Event monitor: no VT. Atach noted.  . Obesity (BMI 30.0-34.9)   . Psoriasis   . Syncope    a. 01/2016 - felt to be vasovagal.  . Tobacco abuse    Past Surgical History:  Procedure Laterality Date  . AMPUTATION    . CARDIAC CATHETERIZATION    . FINGER AMPUTATION     Traumatic  . FINGER FRACTURE SURGERY Left   . LEFT  HEART CATH AND CORONARY ANGIOGRAPHY N/A 01/06/2017   Procedure: Left Heart Cath and Coronary Angiography;  Surgeon: Wellington Hampshire, MD;  Location: [facility];  Service: Cardiovascular;  Laterality: N/A;  . RIGHT/LEFT HEART CATH AND CORONARY ANGIOGRAPHY N/A 06/13/2020   Procedure: RIGHT/LEFT HEART CATH AND CORONARY ANGIOGRAPHY;  Surgeon: Nelva Bush, MD;  Location: [facility];  Service: Cardiovascular;  Laterality: N/A;    Allergies  Allergies  Allergen Reactions  . Metformin And Related Nausea And Vomiting  . Prednisone Other (See Comments)    Reaction: Hallucinations    . Zantac [Ranitidine Hcl] Diarrhea and Nausea Only    Night sweats    History of Present Illness    50 year old male with above past medical history including chronic chest pain, chronic troponin elevation, HFrEF, mixed ischemic and nonischemic cardiomyopathy, nonobstructive CAD, nonsustained VT, stage III chronic kidney disease, COPD, unprovoked PE on chronic anticoagulation, type 2 diabetes mellitus, and obesity.  Over the years, has had multiple hospitalizations for management of chronic HFrEF, chest pain, and elevated troponins.  Prior stress testing 2016 was nonischemic.  In October 2021, he was admitted with chest pain and elevated troponin above her baseline.  Catheterization revealed chronic total occlusion of the right coronary artery with left-to-right collaterals.  He was medically managed and has since been followed intermittently in the advanced heart failure clinic in [place].  Most recent echo showed  an EF of 35 to 40% with global hypokinesis in October 2021.  In early April of this year, he was admitted to [facility] secondary to near syncope.  He was found to have acute kidney injury with a creatinine of 2.44 (baseline 1.2).  Torsemide dose was reduced to 40 mg daily and he was subsequently discharged but then returned to the ED the following day with weakness and hypotension.   He was subsequently admitted for acute kidney injury, dyspnea, and chronic HFrEF.  He was subsequently discharged April 11 and advised to hold Entresto and spironolactone.  Torsemide 20 mg daily was resumed.  Repeat creatinine April 12 was 1.61.  He was seen in clinic on April 14 and reported feeling well.  He was eager to get back on Entresto and reported blood pressures of 140s at home.  I agreed to add back Entresto 24-26 mg twice daily and continued torsemide 20 daily.  After leaving clinic, he called to report that he had recurrent dizziness and he was advised to hold off on resuming Entresto.  Follow-up lab work on April 18 showed stable BUN/creatinine of 38/1.57.  Since his last visit, he notes complete resolution of orthostatic symptoms.  He resumed Entresto about a day or 2 after his last clinic visit and has been tolerating it well (49-51 twice daily) his weight is down 4 pounds since his last visit and he notes improved energy.  He denies chest pain, dyspnea, palpitations, PND, orthopnea, dizziness, syncope, edema, or early satiety.  He has made changes to his diet, cutting out his daily morning sausage.  Home Medications    Prior to Admission medications   Medication Sig Start Date End Date Taking? Authorizing Provider  carvedilol (COREG) 6.25 MG tablet Take 1 tablet (6.25 mg total) by mouth 2 (two) times daily with a meal. 08/02/20  Yes Larey Dresser, MD  cetirizine (ZYRTEC) 10 MG tablet Take 10 mg by mouth daily.   Yes [provider]  COMBIVENT RESPIMAT 20-100 MCG/ACT AERS respimat Inhale 1 puff into the lungs 4 (four) times daily. 10/26/20  Yes [provider]  ENTRESTO 49-51 MG Take 1 tablet by mouth at bedtime. 11/30/20  Yes [provider]  fenofibrate (TRICOR) 145 MG tablet Take 1 tablet (145 mg total) by mouth daily. 06/09/20  Yes Lorella Nimrod, MD  HUMALOG KWIKPEN 100 UNIT/ML KwikPen Inject 3 Units into the skin 3 (three) times daily. 11/27/20  Yes  [provider]  Insulin Glargine (BASAGLAR KWIKPEN) 100 UNIT/ML Inject 28 Units into the skin at bedtime. 06/09/20  Yes Lorella Nimrod, MD  nitroGLYCERIN (NITROSTAT) 0.4 MG SL tablet Place 1 tablet (0.4 mg total) under the tongue every 5 (five) minutes x 3 doses as needed for chest pain. 06/09/20  Yes Lorella Nimrod, MD  rivaroxaban (XARELTO) 20 MG TABS tablet Take 1 tablet (20 mg total) by mouth daily with supper. 11/07/20  Yes Cammie Sickle, MD  rosuvastatin (CRESTOR) 20 MG tablet Take 1 tablet (20 mg total) by mouth at bedtime. 06/09/20  Yes Lorella Nimrod, MD  torsemide (DEMADEX) 20 MG tablet Take 1 tablet (20 mg total) by mouth daily. 11/27/20  Yes Dessa Phi, DO  TRULICITY 8.84 ZY/6.0YT SOPN INJECT 0.75 MG INTO THE SKIN ONCE A WEEK. 09/28/20  Yes Trinna Post, PA-C     Review of Systems    Feeling much better.  He denies chest pain, palpitations, dyspnea, pnd, orthopnea, n, v, dizziness, syncope, edema, weight gain, or early  satiety.  All other systems reviewed and are otherwise negative except as noted above.  Physical Exam    VS:  BP 114/76   Pulse 75   Ht 6' (1.829 m)   Wt 233 lb (105.7 kg)   BMI 31.60 kg/m  , BMI Body mass index is 31.6 kg/m.  Orthostatic VS for the past 24 hrs:  BP- Lying Pulse- Lying BP- Sitting Pulse- Sitting BP- Standing at 0 minutes Pulse- Standing at 0 minutes  12/07/20 0812 112/75 73 116/78 85 118/81 84    GEN: Well nourished, well developed, in no acute distress. HEENT: normal. Neck: Supple, no JVD, carotid bruits, or masses. Cardiac: RRR, no murmurs, rubs, or gallops. No clubbing, cyanosis, edema.  Radials/PT 2+ and equal bilaterally.  Respiratory:  Respirations regular and unlabored, clear to auscultation bilaterally. GI: Soft, nontender, nondistended, BS + x 4. MS: no deformity or atrophy. Skin: warm and dry, no rash. Neuro:  Strength and sensation are intact. Psych: Normal affect.  Accessory Clinical Findings    ECG  personally reviewed by me today -regular sinus rhythm, 75, PAC, rightward axis - no acute changes.  Lab Results  Component Value Date   WBC 9.0 11/28/2020   HGB 17.1 (H) 11/28/2020   HCT 50.7 11/28/2020   MCV 82.0 11/28/2020   PLT 172 11/28/2020   Lab Results  Component Value Date   CREATININE 1.57 (H) 12/04/2020   BUN 38 (H) 12/04/2020   NA 135 12/04/2020   K 3.7 12/04/2020   CL 93 (L) 12/04/2020   CO2 29 12/04/2020   Lab Results  Component Value Date   ALT 21 11/28/2020   AST 25 11/28/2020   ALKPHOS 57 11/28/2020   BILITOT 1.1 11/28/2020   Lab Results  Component Value Date   CHOL 185 11/23/2020   HDL 19 (L) 11/23/2020   LDLCALC UNABLE TO CALCULATE IF TRIGLYCERIDE OVER 400 mg/dL 11/23/2020   LDLDIRECT 60.6 11/23/2020   TRIG 900 (H) 11/23/2020   CHOLHDL 9.7 11/23/2020    Lab Results  Component Value Date   HGBA1C 7.1 (H) 10/27/2020    Assessment & Plan    1.  Orthostatic hypotension: As noted above, patient recently seen in the ED with orthostasis, presyncope, and acute kidney injury.  At his last office visit he was feeling better but then has recurrent lightheadedness following the visit.  This is since resolved and he has resumed his home dose of Entresto with out recurrent symptoms.  He is not orthostatic today.  Continue current regimen.  2.  Chronic heart failure with reduced ejection fraction/mixed ischemic and nonischemic cardiomyopathy: EF 35 to 40% by echo in October 2021.  Weight down 4 pounds since last visit and he is euvolemic on examination.  As noted above, he is not orthostatic and no longer having symptoms of lightheadedness.  He has made more effort to cut out salt in his diet which may be contributing to his reduced need of diuretic therapy.  He was previously on torsemide 60 mg daily prior to hospitalization on April 6, but is currently only requiring 20 mg daily.  He is otherwise on carvedilol and Entresto.  I will not make any changes to his  medications today.  He can consider addition of spironolactone and dapagliflozin in the future if pressures remain stable.  3.  Coronary artery disease: Diagnostic catheterization in October 2021 revealing chronic total occlusion of the right coronary artery with left-to-right collaterals.  He has been medically managed.  He  has not been having any chest pain or dyspnea.  He remains on beta-blocker and statin therapy.  He is not on aspirin given chronic Xarelto.  4.  Stage III chronic kidney disease: Baseline creatinine approximately 1.2.  Recent admission for acute kidney injury with a creatinine of 2.44.  Creatinine stable on April 18 on low-dose torsemide and Entresto at 1.57.  No changes to medications today.  Plan follow-up basic metabolic panel when I see him back in 1 month.  5.  Essential hypertension: See #1.  Blood pressure stable today without orthostasis.  Continue beta-blocker and Entresto.  6.  History of unprovoked PE/DVT: On chronic Xarelto.  Like to continue to watch renal function closely progression of renal disease may require dose adjustment.  7.  Hyperlipidemia/hypertriglyceridemia: Remains on statin and Tricor.  Glycerides were markedly elevated at last evaluation at 900, though its not clear if he was fasting.  Direct LDL was 60.6.  8.  Disposition: Follow-up in clinic in 1 month with basic metabolic panel at that time.   Murray Hodgkins, NP 12/07/2020, 9:06 AM

## 2020-12-07 NOTE — Patient Instructions (Addendum)
Medication Instructions:  No changes  *If you need a refill on your cardiac medications before your next appointment, please call your pharmacy*   Lab Work: None  If you have labs (blood work) drawn today and your tests are completely normal, you will receive your results only by: Marland Kitchen MyChart Message (if you have MyChart) OR . A paper copy in the mail If you have any lab test that is abnormal or we need to change your treatment, we will call you to review the results.   Testing/Procedures: None   Follow-Up: At [facility], you and your health needs are our priority.  As part of our continuing mission to provide you with exceptional heart care, we have created designated Provider Care Teams.  These Care Teams include your primary Cardiologist (physician) and Advanced Practice Providers (APPs -  Physician Assistants and Nurse Practitioners) who all work together to provide you with the care you need, when you need it.  We recommend signing up for the patient portal called "MyChart".  Sign up information is provided on this After Visit Summary.  MyChart is used to connect with patients for Virtual Visits (Telemedicine).  Patients are able to view lab/test results, encounter notes, upcoming appointments, etc.  Non-urgent messages can be sent to your provider as well.   To learn more about what you can do with MyChart, go to [email or web address].    Your next appointment:   1 month(s)  The format for your next appointment:   In Person  Provider:   [name], MD or Murray Hodgkins, NP    Cardiac Rehab number is [phone]

## 2020-12-08 ENCOUNTER — Inpatient Hospital Stay: Payer: Medicaid Other | Admitting: Family Medicine

## 2020-12-08 NOTE — Progress Notes (Deleted)
      Established patient visit   Patient: Gabriel [name].   DOB: 1971/08/16   50 y.o. Male  MRN: [id number] Visit Date: 12/08/2020  Today's healthcare provider: Lelon Huh, MD   No chief complaint on file.  Subjective    HPI  Follow up Hospitalization  Patient was admitted to [facility] on 11/23/20 and discharged on 11/27/20. He was treated for sob. Treatment for this included IVF with initial improvement in Cr. Due to some symptoms of SOB and CP, patient was resumed on lower dose of torsemide. Telephone follow up was done on n/a. Does not appear to be a f/u phone call according to patient encounters. He reports {excellent/good/fair:19992} compliance with treatment. He reports this condition is {resolved/improved/worsened:23923}.  ----------------------------------------------------------------------------------------- -    {Show patient history (optional):23778::" "}   Medications: Outpatient Medications Prior to Visit  Medication Sig  . carvedilol (COREG) 6.25 MG tablet Take 1 tablet (6.25 mg total) by mouth 2 (two) times daily with a meal.  . cetirizine (ZYRTEC) 10 MG tablet Take 10 mg by mouth daily.  . COMBIVENT RESPIMAT 20-100 MCG/ACT AERS respimat Inhale 1 puff into the lungs 4 (four) times daily.  Marland Kitchen ENTRESTO 49-51 MG Take 1 tablet by mouth at bedtime.  . fenofibrate (TRICOR) 145 MG tablet Take 1 tablet (145 mg total) by mouth daily.  Marland Kitchen HUMALOG KWIKPEN 100 UNIT/ML KwikPen Inject 3 Units into the skin 3 (three) times daily.  . Insulin Glargine (BASAGLAR KWIKPEN) 100 UNIT/ML Inject 28 Units into the skin at bedtime.  . nitroGLYCERIN (NITROSTAT) 0.4 MG SL tablet Place 1 tablet (0.4 mg total) under the tongue every 5 (five) minutes x 3 doses as needed for chest pain.  . rivaroxaban (XARELTO) 20 MG TABS tablet Take 1 tablet (20 mg total) by mouth daily with supper.  . rosuvastatin (CRESTOR) 20 MG tablet Take 1 tablet (20 mg total) by mouth at bedtime.  . torsemide (DEMADEX)  20 MG tablet Take 1 tablet (20 mg total) by mouth daily.  . TRULICITY 0.27 OZ/3.6UY SOPN INJECT 0.75 MG INTO THE SKIN ONCE A WEEK.   No facility-administered medications prior to visit.    Review of Systems  {Labs  Heme  Chem  Endocrine  Serology  Results Review (optional):23779::" "}   Objective    There were no vitals taken for this visit. {Show previous vital signs (optional):23777::" "}   Physical Exam  ***  No results found for any visits on 12/08/20.  Assessment & Plan     ***  No follow-ups on file.      {provider attestation***:1}   Lelon Huh, MD  [facility] [phone] (phone) [phone] (fax)  [place]

## 2020-12-14 ENCOUNTER — Ambulatory Visit: Payer: Medicaid Other | Admitting: Family

## 2020-12-16 IMAGING — CR DG CHEST 2V
1 series · 2 of 2 positions shown · non-contrast
Comparison: Prior radiograph from 11/19/2018.

CLINICAL DATA: Initial evaluation for acute chest pain.

EXAM:
CHEST - 2 VIEW

[Series 1: dg chest 2 view · 0.14mm/px · 2 of 2 slices shown]
[im 1/2]
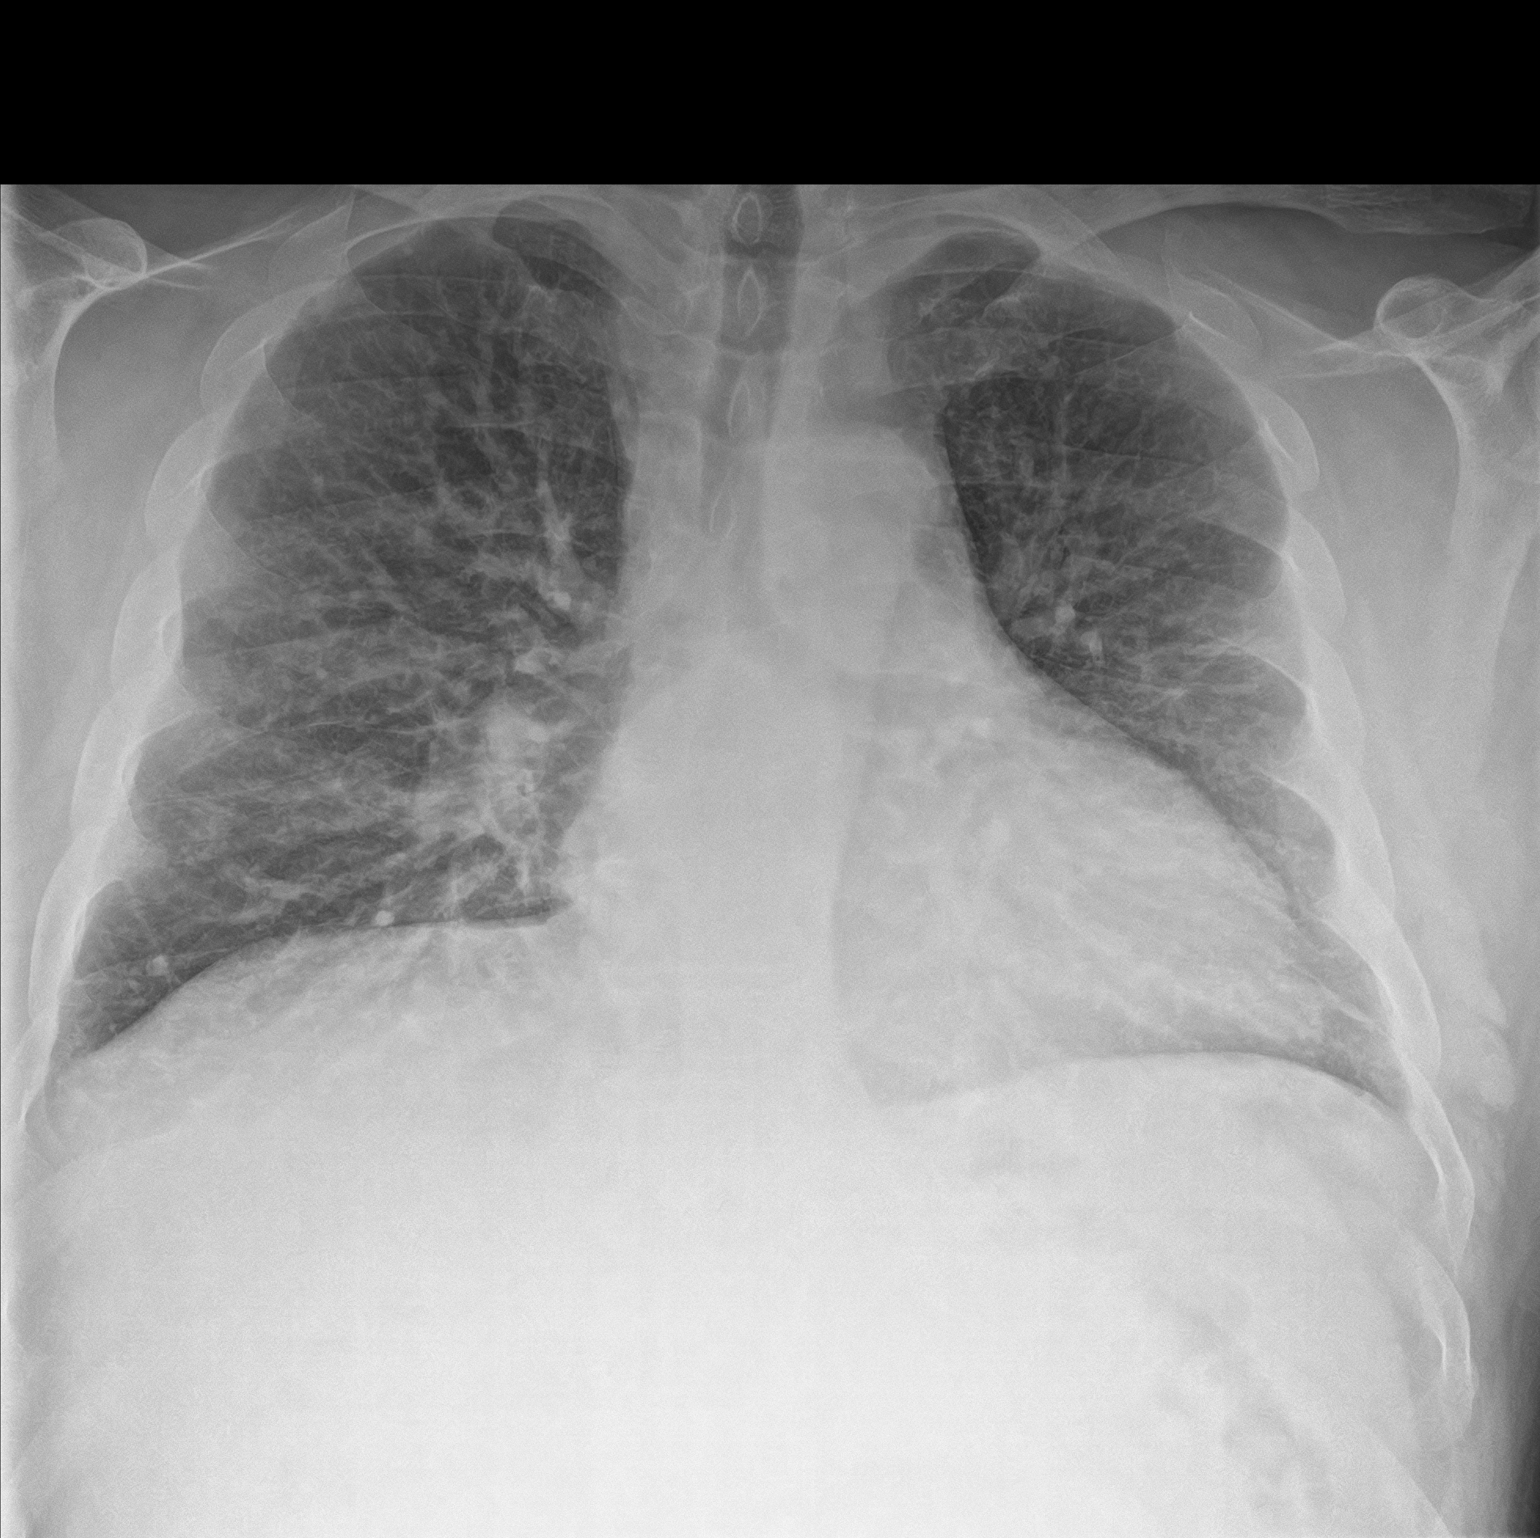
[im 2/2]
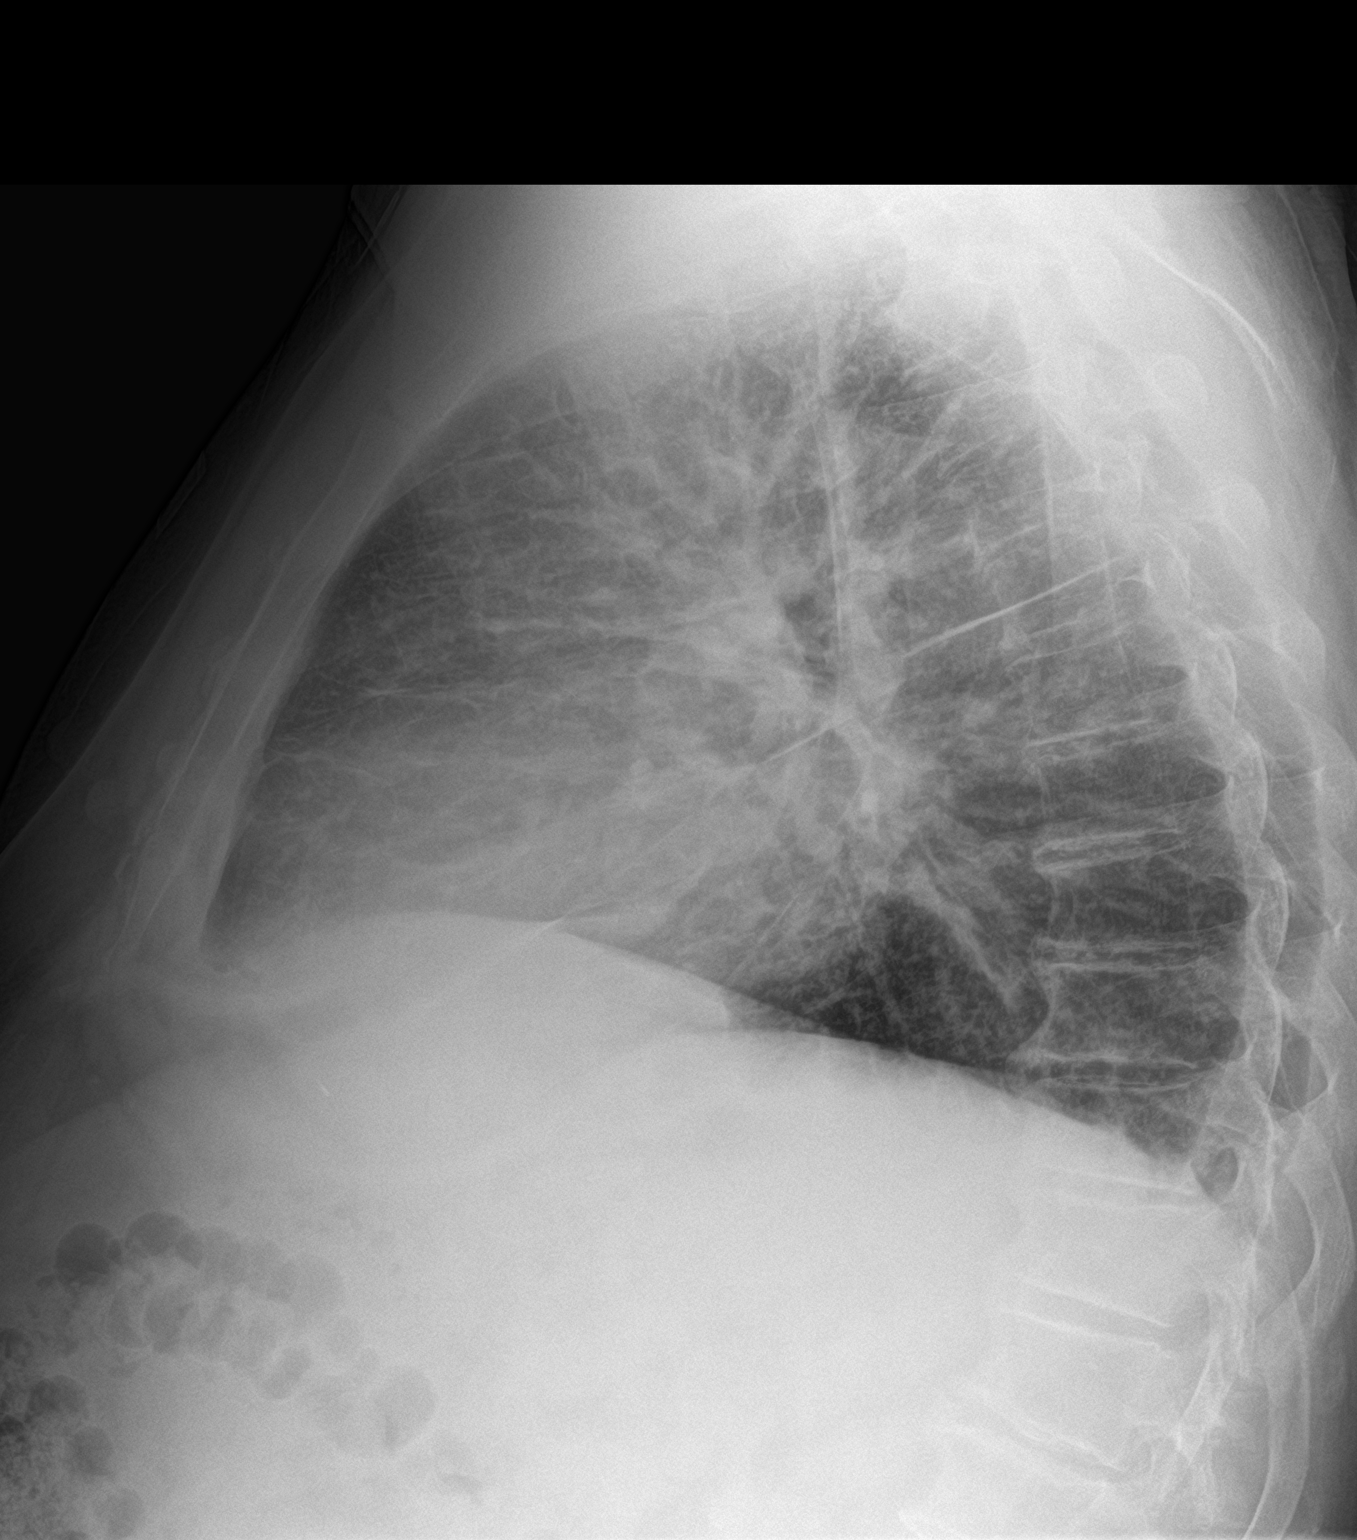

[2 of 2 positions shown; findings below may reference images not displayed]

FINDINGS: Cardiomegaly, stable.  Mediastinal silhouette within normal limits.

Lungs normally inflated. Diffuse pulmonary vascular congestion with
interstitial prominence, compatible with diffuse pulmonary
interstitial edema. Superimposed streaky bibasilar atelectasis. No
consolidative opacity. No pleural effusion. No pneumothorax.

No acute osseous finding.
IMPRESSION: 1. Cardiomegaly with mild diffuse pulmonary interstitial edema.
2. No other active cardiopulmonary disease.

## 2021-01-07 NOTE — Progress Notes (Deleted)
Patient ID: Gabriel Tolan., male   DOB: 09-Dec-1970, 50 y.o.   MRN: [id number] Cardiology Office Note  Date:  01/07/2021   ID:  Gabriel Latterell., DOB 1971-07-05, MRN [id number]  PCP:  Virginia Crews, MD   No chief complaint on file.   HPI:  50 y.o. male with h/o  nonischemic cardiomyopathy by nuclear stress test in 2016, OSA, did not tolerate CPAP History of medication noncompliance History of chronic chest pain [facility] on 01/03/16 with cough and SOB, septic shock, right upper lobe pneumonia,  Left heart cath 01/06/2017 Nonobstructive coronary disease, 40% in RCA  left ventricular ejection fraction  25-35% by visual estimate. chronic systolic CHF, Follow-up echocardiograms ejection fraction 30-35%, up to 45% - 50% with medical management COPD, long smoking history Prior runs of nonsustained VT, syncope DM2, med noncompliance, Morbid obesity He presents today for follow-up of his nonischemic cardiomyopathy, chronic systolic CHF  LOV with myself 12/2019 Seen by one of our providers 12/07/2020 Back on [id number] twice daily   Recent hospitalization for CHF Aggressive diuresis, discharge Dec 23, 2019 Over 13 L negative at admission Weight down to 107.7 kg Prior weight 105 kg December 01 2019 We will transition back to torsemide 40 twice daily at discharge  echo 11/23/2019: 1. Left ventricular ejection fraction, by estimation, is 25 to 30%. The  left ventricle has severely decreased function. The left ventricle  demonstrates global hypokinesis. The left ventricular internal cavity size  was mildly dilated. There is mild left  ventricular hypertrophy. Left ventricular diastolic parameters are  consistent with Grade III diastolic dysfunction (restrictive). Elevated  left atrial pressure.   Recent lab work sodium 130, glucose 220s, creatinine 1.27 BUN 36  Weight today 104 kg, likely around his dry weight Home weight 225 (here 229) Lost weight over the past 2 years  On  discussion today relying on samples of Entresto He did not sign up for insurance through work, [employer], " missed the date he was in the hospital".  Unable to sign up for insurance until November this year  --We called medical management across the street that can help with medications, he was given paperwork in January did not return it  Works at [employer], [employer] think I can work" On prior office visits was looking into disability, judge told him that he did not qualify  HBA1C 8.7 Not on insurance, not on insulin Looking into medicaid  EKG personally reviewed by myself on todays visit Shows normal sinus rhythm rate 67 bpm nonspecific ST abnormality T wave inversion V6 1 aVL  Other past medical history reviewed Seen in the emergency room 11/16/2017 Chronic chest pain shortness of breath Low back pain pain radiating down left leg Normal renal function Emergency room told him that they do not do outpatient workup and do not provide pain medication   hospitalization for shortness of breath Left heart cath 01/06/2017 Nonobstructive coronary disease, 40% in RCA  left ventricular ejection fraction  25-35% by visual estimate.  In hospital 04/2017 orthostatic lightheadedness in the setting of acute kidney injury. Creatinine 2.6 intravascular volume depletion Meds: Entresto,  torsemide to 40 mg daily,  metoprolol succinate to 50 mg daily. Spironolactone held  LVEF, 45-50%.  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 Now reports he is going to [facility]  Reports having sleep study in the past, was positive, did not tolerate CPAP.  Does not want to wear that, feels he sleeps fine    PMH:   has a past medical history of CAD (coronary artery disease), Chest wall pain, chronic, Chronic Troponin Elevation, CKD (chronic kidney disease), stage II-III, COPD (chronic obstructive pulmonary disease) ([place]), Diabetes mellitus without complication ([place]), HFrEF (heart failure with reduced ejection fraction) ([place]), Hypertension, Mixed Ischemic & NICM (nonischemic cardiomyopathy) ([place]), Myocardial infarct ([place]), NSVT (nonsustained ventricular tachycardia) ([place]), Obesity (BMI 30.0-34.9), Psoriasis, Syncope, and Tobacco abuse.  PSH:    Past Surgical History:  Procedure Laterality Date  . AMPUTATION    . CARDIAC CATHETERIZATION    . FINGER AMPUTATION     Traumatic  . FINGER FRACTURE SURGERY Left   . LEFT HEART CATH AND CORONARY ANGIOGRAPHY N/A 01/06/2017   Procedure: Left Heart Cath and Coronary Angiography;  Surgeon: Wellington Hampshire, MD;  Location: [facility];  Service: Cardiovascular;  Laterality: N/A;  . RIGHT/LEFT HEART CATH AND CORONARY ANGIOGRAPHY N/A 06/13/2020   Procedure: RIGHT/LEFT HEART CATH AND CORONARY ANGIOGRAPHY;  Surgeon: Nelva Bush, MD;  Location: [facility];  Service: Cardiovascular;  Laterality: N/A;    Current Outpatient Medications  Medication Sig Dispense Refill  . carvedilol (COREG) 6.25 MG tablet Take 1 tablet (6.25 mg total) by mouth 2 (two) times daily with a meal. 180 tablet 3  . cetirizine (ZYRTEC) 10 MG tablet Take 10 mg by mouth daily.    . COMBIVENT RESPIMAT 20-100 MCG/ACT AERS respimat Inhale 1 puff into the lungs 4 (four) times daily.    Marland Kitchen ENTRESTO 49-51 MG Take 1 tablet by mouth at bedtime.    . fenofibrate (TRICOR) 145 MG tablet Take 1 tablet (145 mg total) by mouth daily. 30 tablet 6  . HUMALOG  KWIKPEN 100 UNIT/ML KwikPen Inject 3 Units into the skin 3 (three) times daily.    . Insulin Glargine (BASAGLAR KWIKPEN) 100 UNIT/ML Inject 28 Units into the skin at bedtime. 15 mL 2  . nitroGLYCERIN (NITROSTAT) 0.4 MG SL tablet Place 1 tablet (0.4 mg total) under the tongue every 5 (five) minutes x 3 doses as needed for chest pain. 30 tablet 0  . rivaroxaban (XARELTO) 20 MG TABS tablet Take 1 tablet (20 mg total) by mouth daily with supper. 30 tablet 0  . rosuvastatin (CRESTOR) 20 MG tablet Take 1 tablet (20 mg total) by mouth at bedtime. 30 tablet 6  . torsemide (DEMADEX) 20 MG tablet Take 1 tablet (20 mg total) by mouth daily. 30 tablet 0  . TRULICITY 6.43 PI/9.5JO SOPN INJECT 0.75 MG INTO THE SKIN ONCE A WEEK. 2 mL 2   No current facility-administered medications for this visit.    Allergies:   Metformin and related, Prednisone, and Zantac [ranitidine hcl]   Social History:  The patient  reports that he has been smoking Cigarettes.  He has been smoking about 2.00 packs per day for the past 33 years. He has never used smokeless tobacco. He reports that he drinks about 1.8 oz of alcohol per week. He reports that  he does not use illicit drugs.   Family History:   family history includes Cancer in his maternal grandfather and paternal aunt; Diabetes in his maternal grandfather; Diabetes Mellitus II in his mother; Gout in his father; Hypertension in his father and mother; Hypothyroidism in his mother.    Review of Systems: Review of Systems  Constitutional: Negative.   Respiratory: Positive for shortness of breath.   Cardiovascular: Negative.   Gastrointestinal: Negative.   Musculoskeletal: Positive for back pain.  Psychiatric/Behavioral: Negative.   All other systems reviewed and are negative.    PHYSICAL EXAM: VS:  There were no vitals taken for this visit. , BMI There is no height or weight on file to calculate BMI. Constitutional:  oriented to person, place, and time. No distress.   HENT:  Head: Grossly normal Eyes:  no discharge. No scleral icterus.  Neck: No JVD, no carotid bruits  Cardiovascular: Regular rate and rhythm, no murmurs appreciated Pulmonary/Chest: Clear to auscultation bilaterally, no wheezes or rails Abdominal: Soft.  no distension.  no tenderness.  Musculoskeletal: Normal range of motion Neurological:  normal muscle tone. Coordination normal. No atrophy Skin: Skin warm and dry Psychiatric: normal affect, pleasant   Recent Labs: 11/23/2020: TSH 1.211 11/26/2020: B Natriuretic Peptide 205.9 11/27/2020: Magnesium 1.7 11/28/2020: ALT 21; Hemoglobin 17.1; Platelets 172 12/04/2020: BUN 38; Creatinine, Ser 1.57; Potassium 3.7; Sodium 135    Lipid Panel Lab Results  Component Value Date   CHOL 185 11/23/2020   HDL 19 (L) 11/23/2020   LDLCALC UNABLE TO CALCULATE IF TRIGLYCERIDE OVER 400 mg/dL 11/23/2020   TRIG 900 (H) 11/23/2020      Wt Readings from Last 3 Encounters:  12/07/20 233 lb (105.7 kg)  11/30/20 237 lb 2 oz (107.6 kg)  11/28/20 239 lb (108.4 kg)      ASSESSMENT AND PLAN:  Syncope, unspecified syncope type No further episodes of syncope  No further work-up at this time  Cardiomyopathy ([place]) - Dilated, nonischemic Long history of medication noncompliance, Not helping himself get plugged in with insurance or medical management He has received paperwork in the past but did not fill it out Again requesting Entresto samples today We have called medical management, he did receive paperwork in January but did not fill it out and return it On a prior office visit reported he did not want to put his father's information on there  Essential hypertension Blood pressure is well controlled on today's visit. No changes made to the medications. Talked about good [email or web address]  Chronic systolic heart failure (HCC) Currently on torsemide 40 twice daily, weight down, he has moderated his fluid intake, likely of chronic weight CMP today Recommend  he continue to monitor blood pressure and weight at home  coronary artery disease without angina Previous cardiac catheterization with nonobstructive disease,  unrelated to his cardiomyopathy Smoking cessation recommended  Hospital records reviewed,  phone calls made on his behalf medical management, looking for [facility]  Total encounter time more than 45 minutes  Greater than 50% was spent in counseling and coordination of care with the patient    No orders of the defined types were placed in this encounter.    Signed, Esmond Plants, M.D., Ph.D. 01/07/2021  [facility] [place], [place] [phone]

## 2021-01-08 ENCOUNTER — Ambulatory Visit: Payer: Medicaid Other | Admitting: Cardiovascular Disease

## 2021-01-08 DIAGNOSIS — I472 Ventricular tachycardia: Secondary | ICD-10-CM

## 2021-01-08 DIAGNOSIS — I5022 Chronic systolic (congestive) heart failure: Secondary | ICD-10-CM

## 2021-01-08 DIAGNOSIS — I10 Essential (primary) hypertension: Secondary | ICD-10-CM

## 2021-01-08 DIAGNOSIS — N1831 Chronic kidney disease, stage 3a: Secondary | ICD-10-CM

## 2021-01-08 DIAGNOSIS — I428 Other cardiomyopathies: Secondary | ICD-10-CM

## 2021-01-08 DIAGNOSIS — I2510 Atherosclerotic heart disease of native coronary artery without angina pectoris: Secondary | ICD-10-CM

## 2021-01-08 DIAGNOSIS — E785 Hyperlipidemia, unspecified: Secondary | ICD-10-CM

## 2021-01-08 DIAGNOSIS — I951 Orthostatic hypotension: Secondary | ICD-10-CM

## 2021-01-12 ENCOUNTER — Other Ambulatory Visit: Payer: Self-pay

## 2021-01-12 ENCOUNTER — Encounter: Payer: Self-pay | Admitting: *Deleted

## 2021-01-12 ENCOUNTER — Encounter: Payer: Medicaid Other | Attending: Cardiovascular Disease | Admitting: *Deleted

## 2021-01-12 DIAGNOSIS — I5042 Chronic combined systolic (congestive) and diastolic (congestive) heart failure: Secondary | ICD-10-CM

## 2021-01-12 NOTE — Progress Notes (Signed)
Virtual orientation call completed today. he has an appointment on Date: 01/17/2021 for EP eval and gym Orientation.  Documentation of diagnosis can be found in [facility] Date: 01/17/2021.

## 2021-01-15 IMAGING — DX DG CHEST 1V PORT
1 series · 1 of 1 positions shown · non-contrast
Comparison: June 25, 2019

CLINICAL DATA: Productive cough.  Sore throat.

EXAM:
PORTABLE CHEST 1 VIEW

[chest ap]
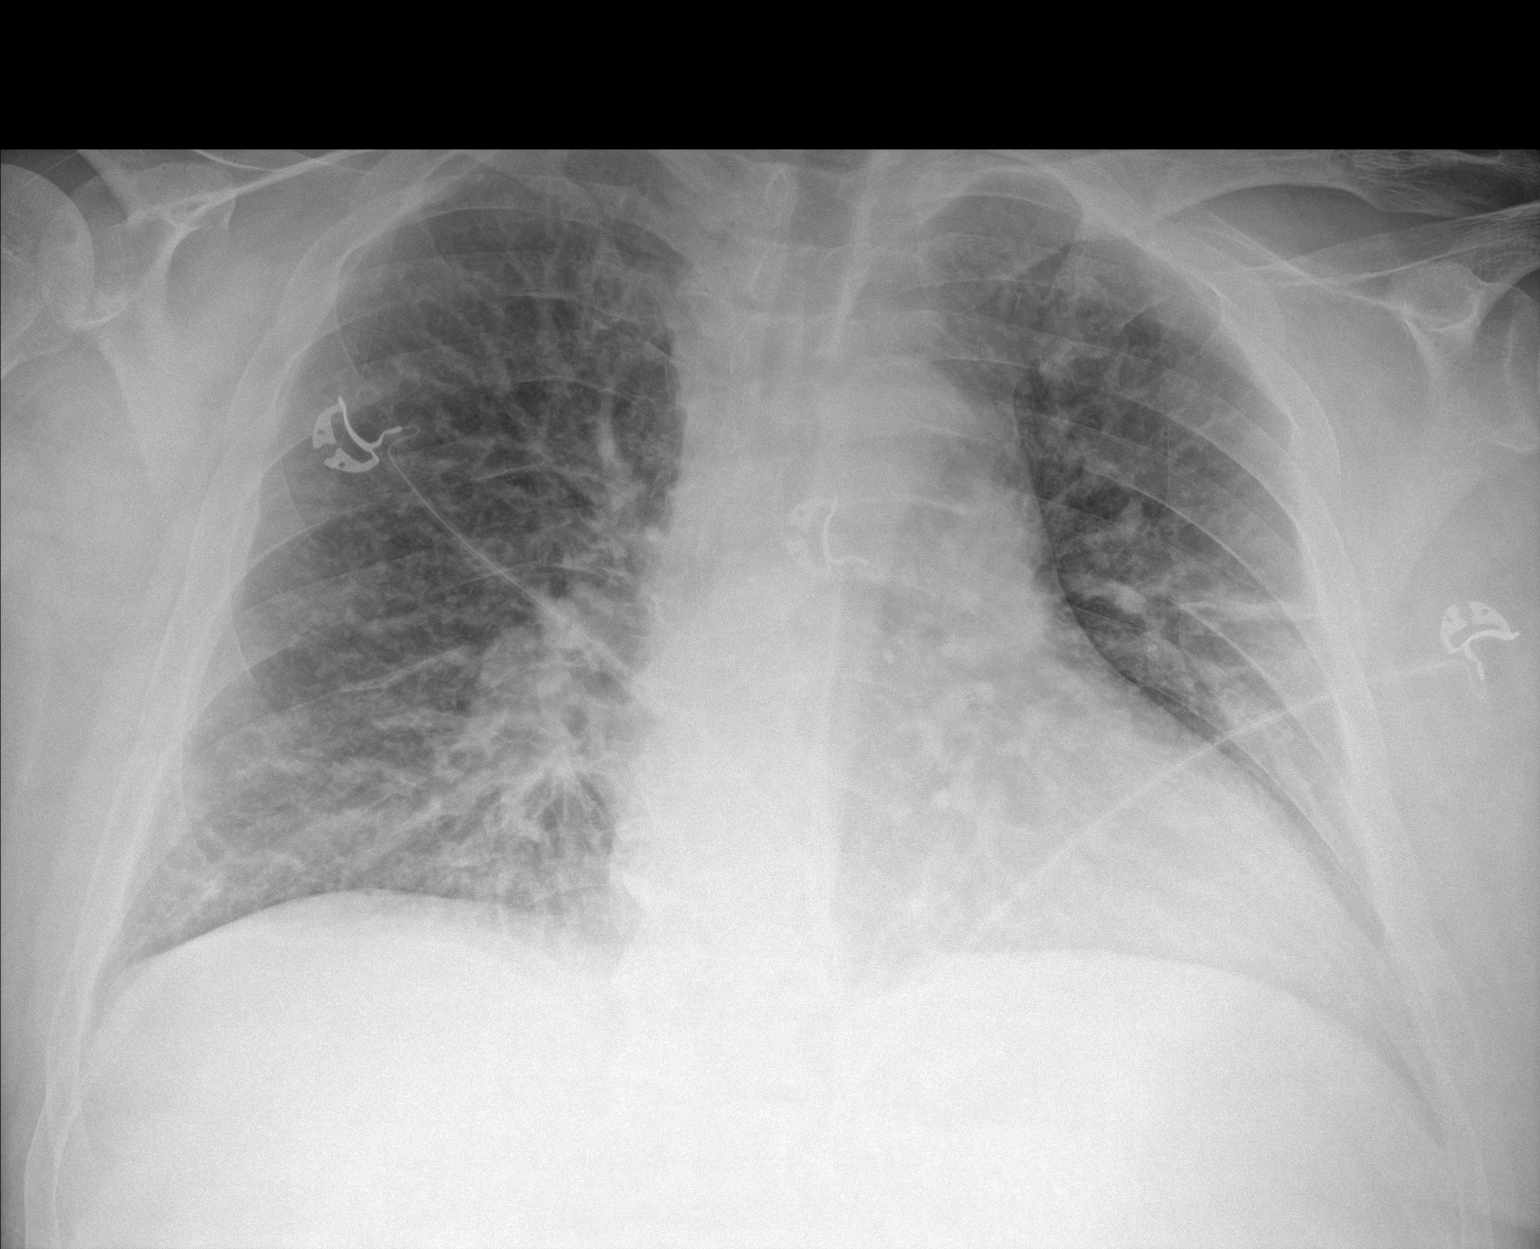

[1 of 1 positions shown; findings below may reference images not displayed]

FINDINGS: Stable cardiomegaly. Increased interstitial markings bilaterally are
similar in the interval. There is some atelectasis in the left mid
lung. No pneumothorax. No other acute abnormalities.
IMPRESSION: Increased interstitial markings may represent mild edema/pulmonary
venous congestion versus atypical infection. Recommend clinical
correlation. The findings are similar since June 25, 2019.

## 2021-01-17 ENCOUNTER — Other Ambulatory Visit: Payer: Self-pay

## 2021-01-17 ENCOUNTER — Encounter: Payer: Medicaid Other | Attending: Cardiovascular Disease | Admitting: *Deleted

## 2021-01-17 VITALS — Ht 72.0 in | Wt 242.2 lb

## 2021-01-17 DIAGNOSIS — I5040 Unspecified combined systolic (congestive) and diastolic (congestive) heart failure: Secondary | ICD-10-CM | POA: Insufficient documentation

## 2021-01-17 DIAGNOSIS — I5042 Chronic combined systolic (congestive) and diastolic (congestive) heart failure: Secondary | ICD-10-CM | POA: Diagnosis not present

## 2021-01-17 NOTE — Patient Instructions (Signed)
Patient Instructions  Patient Details  Name: Gabriel [name]. MRN: [id number] Date of Birth: [birth year] Referring Provider:  Minna Merritts, MD  Below are your personal goals for exercise, nutrition, and risk factors. Our goal is to help you stay on track towards obtaining and maintaining these goals. We will be discussing your progress on these goals with you throughout the program.  Initial Exercise Prescription:  Initial Exercise Prescription - 01/17/21 1400      Date of Initial Exercise RX and Referring Provider   Date 01/17/21    Referring Provider [name] MD      Treadmill   MPH 2.5    Grade 1    Minutes 15    METs 3.26      Recumbant Bike   Level 3    RPM 50    Watts 73    Minutes 15    METs 3      Recumbant Elliptical   Level 2    RPM 50    Minutes 15    METs 3      REL-XR   Level 3    Speed 50    Minutes 15    METs 3      Prescription Details   Frequency (times per week) 3    Duration Progress to 30 minutes of continuous aerobic without signs/symptoms of physical distress      Intensity   THRR 40-80% of Max Heartrate 124-154    Ratings of Perceived Exertion 11-13    Perceived Dyspnea 0-4      Progression   Progression Continue to progress workloads to maintain intensity without signs/symptoms of physical distress.      Resistance Training   Training Prescription Yes    Weight 5 lb    Reps 10-15           Exercise Goals: Frequency: Be able to perform aerobic exercise two to three times per week in program working toward 2-5 days per week of home exercise.  Intensity: Work with a perceived exertion of 11 (fairly light) - 15 (hard) while following your exercise prescription.  We will make changes to your prescription with you as you progress through the program.   Duration: Be able to do 30 to 45 minutes of continuous aerobic exercise in addition to a 5 minute warm-up and a 5 minute cool-down routine.   Nutrition Goals: Your  personal nutrition goals will be established when you do your nutrition analysis with the dietician.  The following are general nutrition guidelines to follow: Cholesterol < 200mg /day Sodium < 1500mg /day Fiber: Men over 50 yrs - 30 grams per day  Personal Goals:  Personal Goals and Risk Factors at Admission - 01/17/21 1435      Core Components/Risk Factors/Patient Goals on Admission    Weight Management Yes;Weight Loss;Obesity    Intervention Weight Management: Develop a combined nutrition and exercise program designed to reach desired caloric intake, while maintaining appropriate intake of nutrient and fiber, sodium and fats, and appropriate energy expenditure required for the weight goal.;Weight Management: Provide education and appropriate resources to help participant work on and attain dietary goals.;Weight Management/Obesity: Establish reasonable short term and long term weight goals.;Obesity: Provide education and appropriate resources to help participant work on and attain dietary goals.    Admit Weight 242 lb 3.2 oz (109.9 kg)    Goal Weight: Short Term 235 lb (106.6 kg)    Goal Weight: Long Term 200 lb (90.7 kg)  Expected Outcomes Short Term: Continue to assess and modify interventions until short term weight is achieved;Long Term: Adherence to nutrition and physical activity/exercise program aimed toward attainment of established weight goal;Weight Loss: Understanding of general recommendations for a balanced deficit meal plan, which promotes 1-2 lb weight loss per week and includes a negative energy balance of [phone] kcal/d;Understanding recommendations for meals to include 15-35% energy as protein, 25-35% energy from fat, 35-60% energy from carbohydrates, less than 200mg  of dietary cholesterol, 20-35 gm of total fiber daily;Understanding of distribution of calorie intake throughout the day with the consumption of 4-5 meals/snacks    Diabetes Yes    Intervention Provide education  about signs/symptoms and action to take for hypo/hyperglycemia.;Provide education about proper nutrition, including hydration, and aerobic/resistive exercise prescription along with prescribed medications to achieve blood glucose in normal ranges: Fasting glucose 65-99 mg/dL    Expected Outcomes Short Term: Participant verbalizes understanding of the signs/symptoms and immediate care of hyper/hypoglycemia, proper foot care and importance of medication, aerobic/resistive exercise and nutrition plan for blood glucose control.;Long Term: Attainment of HbA1C < 7%.    Heart Failure Yes    Intervention Provide a combined exercise and nutrition program that is supplemented with education, support and counseling about heart failure. Directed toward relieving symptoms such as shortness of breath, decreased exercise tolerance, and extremity edema.    Expected Outcomes Improve functional capacity of life;Short term: Attendance in program 2-3 days a week with increased exercise capacity. Reported lower sodium intake. Reported increased fruit and vegetable intake. Reports medication compliance.;Short term: Daily weights obtained and reported for increase. Utilizing diuretic protocols set by physician.;Long term: Adoption of self-care skills and reduction of barriers for early signs and symptoms recognition and intervention leading to self-care maintenance.    Hypertension Yes    Intervention Provide education on lifestyle modifcations including regular physical activity/exercise, weight management, moderate sodium restriction and increased consumption of fresh fruit, vegetables, and low fat dairy, alcohol moderation, and smoking cessation.;Monitor prescription use compliance.    Expected Outcomes Short Term: Continued assessment and intervention until BP is < 140/83mm HG in hypertensive participants. < 130/23mm HG in hypertensive participants with diabetes, heart failure or chronic kidney disease.;Long Term: Maintenance  of blood pressure at goal levels.    Lipids Yes    Intervention Provide education and support for participant on nutrition & aerobic/resistive exercise along with prescribed medications to achieve LDL 70mg , HDL >40mg .    Expected Outcomes Short Term: Participant states understanding of desired cholesterol values and is compliant with medications prescribed. Participant is following exercise prescription and nutrition guidelines.;Long Term: Cholesterol controlled with medications as prescribed, with individualized exercise RX and with personalized nutrition plan. Value goals: LDL < 70mg , HDL > 40 mg.           Tobacco Use Initial Evaluation: Social History   Tobacco Use  Smoking Status Former Smoker  . Packs/day: 0.50  . Years: 33.00  . Pack years: 16.50  . Types: Cigarettes  . Quit date: 05/08/2020  . Years since quitting: 0.6  Smokeless Tobacco Former Systems developer  . Quit date: 05/08/2020  Tobacco Comment   Quit Sept 2021    Exercise Goals and Review:  Exercise Goals    Row Name 01/17/21 1431             Exercise Goals   Increase Physical Activity Yes       Intervention Provide advice, education, support and counseling about physical activity/exercise needs.;Develop an individualized exercise prescription for aerobic and  resistive training based on initial evaluation findings, risk stratification, comorbidities and participant's personal goals.       Expected Outcomes Short Term: Attend rehab on a regular basis to increase amount of physical activity.;Long Term: Add in home exercise to make exercise part of routine and to increase amount of physical activity.;Long Term: Exercising regularly at least 3-5 days a week.       Increase Strength and Stamina Yes       Intervention Provide advice, education, support and counseling about physical activity/exercise needs.;Develop an individualized exercise prescription for aerobic and resistive training based on initial evaluation findings, risk  stratification, comorbidities and participant's personal goals.       Expected Outcomes Short Term: Increase workloads from initial exercise prescription for resistance, speed, and METs.;Short Term: Perform resistance training exercises routinely during rehab and add in resistance training at home;Long Term: Improve cardiorespiratory fitness, muscular endurance and strength as measured by increased METs and functional capacity (6MWT)       Able to understand and use rate of perceived exertion (RPE) scale Yes       Intervention Provide education and explanation on how to use RPE scale       Expected Outcomes Short Term: Able to use RPE daily in rehab to express subjective intensity level;Long Term:  Able to use RPE to guide intensity level when exercising independently       Able to understand and use Dyspnea scale Yes       Intervention Provide education and explanation on how to use Dyspnea scale       Expected Outcomes Short Term: Able to use Dyspnea scale daily in rehab to express subjective sense of shortness of breath during exertion;Long Term: Able to use Dyspnea scale to guide intensity level when exercising independently       Knowledge and understanding of Target Heart Rate Range (THRR) Yes       Intervention Provide education and explanation of THRR including how the numbers were predicted and where they are located for reference       Expected Outcomes Short Term: Able to state/look up THRR;Short Term: Able to use daily as guideline for intensity in rehab;Long Term: Able to use THRR to govern intensity when exercising independently       Able to check pulse independently Yes       Intervention Provide education and demonstration on how to check pulse in carotid and radial arteries.;Review the importance of being able to check your own pulse for safety during independent exercise       Expected Outcomes Short Term: Able to explain why pulse checking is important during independent  exercise;Long Term: Able to check pulse independently and accurately       Understanding of Exercise Prescription Yes       Intervention Provide education, explanation, and written materials on patient's individual exercise prescription       Expected Outcomes Short Term: Able to explain program exercise prescription;Long Term: Able to explain home exercise prescription to exercise independently              Copy of goals given to participant.

## 2021-01-17 NOTE — Progress Notes (Signed)
Pulmonary Individual Treatment Plan  Patient Details  Name: Gabriel [name]. MRN: [id number] Date of Birth: 1971/05/01 Referring Provider:   Flowsheet Row Pulmonary Rehab from 01/17/2021 in [facility] Cardiac and Pulmonary Rehab  Referring Provider [name] MD      Initial Encounter Date:  Flowsheet Row Pulmonary Rehab from 01/17/2021 in [facility] Cardiac and Pulmonary Rehab  Date 01/17/21      Visit Diagnosis: Chronic combined systolic and diastolic congestive heart failure ([place])  Patient's Home Medications on Admission:  Current Outpatient Medications:  .  carvedilol (COREG) 6.25 MG tablet, Take 1 tablet (6.25 mg total) by mouth 2 (two) times daily with a meal., Disp: 180 tablet, Rfl: 3 .  cetirizine (ZYRTEC) 10 MG tablet, Take 10 mg by mouth daily., Disp: , Rfl:  .  COMBIVENT RESPIMAT 20-100 MCG/ACT AERS respimat, Inhale 1 puff into the lungs 4 (four) times daily., Disp: , Rfl:  .  ENTRESTO 49-51 MG, Take 1 tablet by mouth at bedtime., Disp: , Rfl:  .  fenofibrate (TRICOR) 145 MG tablet, Take 1 tablet (145 mg total) by mouth daily., Disp: 30 tablet, Rfl: 6 .  HUMALOG KWIKPEN 100 UNIT/ML KwikPen, Inject 3 Units into the skin 3 (three) times daily., Disp: , Rfl:  .  Insulin Glargine (BASAGLAR KWIKPEN) 100 UNIT/ML, Inject 28 Units into the skin at bedtime., Disp: 15 mL, Rfl: 2 .  nitroGLYCERIN (NITROSTAT) 0.4 MG SL tablet, Place 1 tablet (0.4 mg total) under the tongue every 5 (five) minutes x 3 doses as needed for chest pain., Disp: 30 tablet, Rfl: 0 .  rivaroxaban (XARELTO) 20 MG TABS tablet, Take 1 tablet (20 mg total) by mouth daily with supper., Disp: 30 tablet, Rfl: 0 .  rosuvastatin (CRESTOR) 20 MG tablet, Take 1 tablet (20 mg total) by mouth at bedtime., Disp: 30 tablet, Rfl: 6 .  torsemide (DEMADEX) 20 MG tablet, Take 1 tablet (20 mg total) by mouth daily., Disp: 30 tablet, Rfl: 0 .  TRULICITY 0.01 VC/9.4WH SOPN, INJECT 0.75 MG INTO THE SKIN ONCE A WEEK., Disp: 2 mL, Rfl:  2  Past Medical History: Past Medical History:  Diagnosis Date  . CAD (coronary artery disease)    a. 04/2015 low risk MV;  b. 12/2016 Cath: minor irregs in LAD/Diag/LCX/OM, RCA 40p/m/d; c. 05/2020 Cath: LM nl, LAD 50d, LCX 30p, OM1 40, RCA 100p w/ L->L collats. CO/CI 3.1/1.3-->Med rx.  . Chest wall pain, chronic   . Chronic Troponin Elevation   . CKD (chronic kidney disease), stage II-III   . COPD (chronic obstructive pulmonary disease) ([place])   . Diabetes mellitus without complication ([place])   . HFrEF (heart failure with reduced ejection fraction) ([place])   . Hypertension    Resolved since weight loss  . Mixed Ischemic & NICM (nonischemic cardiomyopathy) ([place])    a. 03/2015 Echo: EF 45-50%; b. 12/2015 Echo: EF 20-25%; c. 02/2016 Echo: EF 30-35%; d. 11/2016 Echo: EF 40-45%; e. 06/2019 Echo: EF 30-35%; f. 11/2019 Echo: EF 25-30%; g. 05/2020 Echo: EF 35-40%, glob HK, nl RV size/fxn, PASP 44.75mmHg, mod dil LA. Mild to mod MR.  . Myocardial infarct ([place])   . NSVT (nonsustained ventricular tachycardia) ([place])    a. 12/2015 noted on tele-->amio;  b. 12/2015 Event monitor: no VT. Atach noted.  . Obesity (BMI 30.0-34.9)   . Psoriasis   . Syncope    a. 01/2016 - felt to be vasovagal.  . Tobacco abuse     Tobacco Use: Social History  Tobacco Use  Smoking Status Former Smoker  . Packs/day: 0.50  . Years: 33.00  . Pack years: 16.50  . Types: Cigarettes  . Quit date: 05/08/2020  . Years since quitting: 0.6  Smokeless Tobacco Former Systems developer  . Quit date: 05/08/2020  Tobacco Comment   Quit Sept 2021    Labs: Recent Review Flowsheet Data    Labs for ITP Cardiac and Pulmonary Rehab Latest Ref Rng & Units 06/07/2020 06/11/2020 06/23/2020 10/27/2020 11/23/2020   Cholestrol 0 - 200 mg/dL - 126 - - 185   LDLCALC 0 - 99 mg/dL - 75 - - UNABLE TO CALCULATE IF TRIGLYCERIDE OVER 400 mg/dL   LDLDIRECT 0 - 99 mg/dL - - - - 60.6   HDL >40 mg/dL - 25(L) - - 19(L)   Trlycerides <150 mg/dL - 130 - - 900(H)    Hemoglobin A1c 4.8 - 5.6 % 9.3(H) - 9.8(A) 7.1(H) -   PHART 7.350 - 7.450 - - - - -   PCO2ART 32.0 - 48.0 mmHg - - - - -   HCO3 20.0 - 28.0 mmol/L - - - - 33.5(H)   TCO2 0 - 100 mmol/L - - - - -   ACIDBASEDEF 0.0 - 2.0 mmol/L - - - - -   O2SAT % - - - - 86.9       Pulmonary Assessment Scores:  Pulmonary Assessment Scores    Row Name 01/17/21 1436         ADL UCSD   ADL Phase Entry     SOB Score total 12     Rest 0     Walk 1     Stairs 1     Bath 1     Dress 0     Shop 1           CAT Score   CAT Score 10           mMRC Score   mMRC Score 0            UCSD: Self-administered rating of dyspnea associated with activities of daily living (ADLs) 6-point scale (0 = "not at all" to 5 = "maximal or unable to do because of breathlessness")  Scoring Scores range from 0 to 120.  Minimally important difference is 5 units  CAT: CAT can identify the health impairment of COPD patients and is better correlated with disease progression.  CAT has a scoring range of zero to 40. The CAT score is classified into four groups of low (less than 10), medium (10 - 20), high (21-30) and very high (31-40) based on the impact level of disease on health status. A CAT score over 10 suggests significant symptoms.  A worsening CAT score could be explained by an exacerbation, poor medication adherence, poor inhaler technique, or progression of COPD or comorbid conditions.  CAT MCID is 2 points  mMRC: mMRC (Modified Medical Research Council) Dyspnea Scale is used to assess the degree of baseline functional disability in patients of respiratory disease due to dyspnea. No minimal important difference is established. A decrease in score of 1 point or greater is considered a positive change.   Pulmonary Function Assessment:   Exercise Target Goals: Exercise Program Goal: Individual exercise prescription set using results from initial 6 min walk test and THRR while considering  patient's activity  barriers and safety.   Exercise Prescription Goal: Initial exercise prescription builds to 30-45 minutes a day of aerobic activity, 2-3 days per week.  Home  exercise guidelines will be given to patient during program as part of exercise prescription that the participant will acknowledge.  Education: Aerobic Exercise: - Group verbal and visual presentation on the components of exercise prescription. Introduces F.I.T.T principle from ACSM for exercise prescriptions.  Reviews F.I.T.T. principles of aerobic exercise including progression. Written material given at graduation. Flowsheet Row Pulmonary Rehab from 01/17/2021 in [facility] Cardiac and Pulmonary Rehab  Education need identified 01/17/21      Education: Resistance Exercise: - Group verbal and visual presentation on the components of exercise prescription. Introduces F.I.T.T principle from ACSM for exercise prescriptions  Reviews F.I.T.T. principles of resistance exercise including progression. Written material given at graduation.    Education: Exercise & Equipment Safety: - Individual verbal instruction and demonstration of equipment use and safety with use of the equipment. Flowsheet Row Pulmonary Rehab from 01/17/2021 in [facility] Cardiac and Pulmonary Rehab  Date 01/17/21  Educator [facility]  Instruction Review Code 1- Verbalizes Understanding      Education: Exercise Physiology & General Exercise Guidelines: - Group verbal and written instruction with models to review the exercise physiology of the cardiovascular system and associated critical values. Provides general exercise guidelines with specific guidelines to those with heart or lung disease.    Education: Flexibility, Balance, Mind/Body Relaxation: - Group verbal and visual presentation with interactive activity on the components of exercise prescription. Introduces F.I.T.T principle from ACSM for exercise prescriptions. Reviews F.I.T.T. principles of flexibility and balance exercise  training including progression. Also discusses the mind body connection.  Reviews various relaxation techniques to help reduce and manage stress (i.e. Deep breathing, progressive muscle relaxation, and visualization). Balance handout provided to take home. Written material given at graduation.   Activity Barriers & Risk Stratification:  Activity Barriers & Cardiac Risk Stratification - 01/17/21 1421      Activity Barriers & Cardiac Risk Stratification   Activity Barriers Arthritis;Deconditioning;Decreased Ventricular Function           6 Minute Walk:  6 Minute Walk    Row Name 01/17/21 1417         6 Minute Walk   Phase Initial     Distance 1270 feet     Walk Time 6 minutes     # of Rest Breaks 0     MPH 2.41     METS 4.1     RPE 7     Perceived Dyspnea  0     VO2 Peak 14.34     Symptoms No     Resting HR 94 bpm     Resting BP 144/62     Resting Oxygen Saturation  95 %     Exercise Oxygen Saturation  during 6 min walk 93 %     Max Ex. HR 122 bpm     Max Ex. BP 162/74     2 Minute Post BP 122/60           Interval HR   1 Minute HR 109     2 Minute HR 115     3 Minute HR 122     4 Minute HR 106     5 Minute HR 105     6 Minute HR 106     2 Minute Post HR 102     Interval Heart Rate? Yes           Interval Oxygen   Interval Oxygen? Yes     Baseline Oxygen Saturation % 95 %     1  Minute Oxygen Saturation % 94 %     1 Minute Liters of Oxygen 0 L  Room Air     2 Minute Oxygen Saturation % 93 %     2 Minute Liters of Oxygen 0 L     3 Minute Oxygen Saturation % 94 %     3 Minute Liters of Oxygen 0 L     4 Minute Oxygen Saturation % 94 %     4 Minute Liters of Oxygen 0 L     5 Minute Oxygen Saturation % 95 %     5 Minute Liters of Oxygen 0 L     6 Minute Oxygen Saturation % 95 %     6 Minute Liters of Oxygen 0 L     2 Minute Post Oxygen Saturation % 95 %     2 Minute Post Liters of Oxygen 0 L           Oxygen Initial Assessment:  Oxygen Initial  Assessment - 01/17/21 1436      Home Oxygen   Home Oxygen Device None    Sleep Oxygen Prescription CPAP    Liters per minute 0    Home Exercise Oxygen Prescription None    Home Resting Oxygen Prescription None    Compliance with Home Oxygen Use Yes      Initial 6 min Walk   Oxygen Used None      Program Oxygen Prescription   Program Oxygen Prescription None      Intervention   Short Term Goals To learn and demonstrate proper pursed lip breathing techniques or other breathing techniques.;To learn and demonstrate proper use of respiratory medications;To learn and understand importance of maintaining oxygen saturations>88%;To learn and understand importance of monitoring SPO2 with pulse oximeter and demonstrate accurate use of the pulse oximeter.    Long  Term Goals Compliance with respiratory medication;Demonstrates proper use of MDI's;Exhibits proper breathing techniques, such as pursed lip breathing or other method taught during program session;Verbalizes importance of monitoring SPO2 with pulse oximeter and return demonstration;Maintenance of O2 saturations>88%           Oxygen Re-Evaluation:   Oxygen Discharge (Final Oxygen Re-Evaluation):   Initial Exercise Prescription:  Initial Exercise Prescription - 01/17/21 1400      Date of Initial Exercise RX and Referring Provider   Date 01/17/21    Referring Provider [name] MD      Treadmill   MPH 2.5    Grade 1    Minutes 15    METs 3.26      Recumbant Bike   Level 3    RPM 50    Watts 73    Minutes 15    METs 3      Recumbant Elliptical   Level 2    RPM 50    Minutes 15    METs 3      REL-XR   Level 3    Speed 50    Minutes 15    METs 3      Prescription Details   Frequency (times per week) 3    Duration Progress to 30 minutes of continuous aerobic without signs/symptoms of physical distress      Intensity   THRR 40-80% of Max Heartrate 124-154    Ratings of Perceived Exertion 11-13     Perceived Dyspnea 0-4      Progression   Progression Continue to progress workloads to maintain intensity without signs/symptoms of physical distress.  Resistance Training   Training Prescription Yes    Weight 5 lb    Reps 10-15           Perform Capillary Blood Glucose checks as needed.  Exercise Prescription Changes:  Exercise Prescription Changes    Row Name 01/17/21 1400             Response to Exercise   Blood Pressure (Admit) 144/62       Blood Pressure (Exercise) 162/74       Blood Pressure (Exit) 122/60       Heart Rate (Admit) 94 bpm       Heart Rate (Exercise) 122 bpm       Heart Rate (Exit) 96 bpm       Oxygen Saturation (Admit) 95 %       Oxygen Saturation (Exercise) 93 %       Oxygen Saturation (Exit) 96 %       Rating of Perceived Exertion (Exercise) 7       Perceived Dyspnea (Exercise) 0       Symptoms none       Comments walk test results              Exercise Comments:   Exercise Goals and Review:  Exercise Goals    Row Name 01/17/21 1431             Exercise Goals   Increase Physical Activity Yes       Intervention Provide advice, education, support and counseling about physical activity/exercise needs.;Develop an individualized exercise prescription for aerobic and resistive training based on initial evaluation findings, risk stratification, comorbidities and participant's personal goals.       Expected Outcomes Short Term: Attend rehab on a regular basis to increase amount of physical activity.;Long Term: Add in home exercise to make exercise part of routine and to increase amount of physical activity.;Long Term: Exercising regularly at least 3-5 days a week.       Increase Strength and Stamina Yes       Intervention Provide advice, education, support and counseling about physical activity/exercise needs.;Develop an individualized exercise prescription for aerobic and resistive training based on initial evaluation findings, risk  stratification, comorbidities and participant's personal goals.       Expected Outcomes Short Term: Increase workloads from initial exercise prescription for resistance, speed, and METs.;Short Term: Perform resistance training exercises routinely during rehab and add in resistance training at home;Long Term: Improve cardiorespiratory fitness, muscular endurance and strength as measured by increased METs and functional capacity (6MWT)       Able to understand and use rate of perceived exertion (RPE) scale Yes       Intervention Provide education and explanation on how to use RPE scale       Expected Outcomes Short Term: Able to use RPE daily in rehab to express subjective intensity level;Long Term:  Able to use RPE to guide intensity level when exercising independently       Able to understand and use Dyspnea scale Yes       Intervention Provide education and explanation on how to use Dyspnea scale       Expected Outcomes Short Term: Able to use Dyspnea scale daily in rehab to express subjective sense of shortness of breath during exertion;Long Term: Able to use Dyspnea scale to guide intensity level when exercising independently       Knowledge and understanding of Target Heart Rate Range (THRR) Yes  Intervention Provide education and explanation of THRR including how the numbers were predicted and where they are located for reference       Expected Outcomes Short Term: Able to state/look up THRR;Short Term: Able to use daily as guideline for intensity in rehab;Long Term: Able to use THRR to govern intensity when exercising independently       Able to check pulse independently Yes       Intervention Provide education and demonstration on how to check pulse in carotid and radial arteries.;Review the importance of being able to check your own pulse for safety during independent exercise       Expected Outcomes Short Term: Able to explain why pulse checking is important during independent  exercise;Long Term: Able to check pulse independently and accurately       Understanding of Exercise Prescription Yes       Intervention Provide education, explanation, and written materials on patient's individual exercise prescription       Expected Outcomes Short Term: Able to explain program exercise prescription;Long Term: Able to explain home exercise prescription to exercise independently              Exercise Goals Re-Evaluation :   Discharge Exercise Prescription (Final Exercise Prescription Changes):  Exercise Prescription Changes - 01/17/21 1400      Response to Exercise   Blood Pressure (Admit) 144/62    Blood Pressure (Exercise) 162/74    Blood Pressure (Exit) 122/60    Heart Rate (Admit) 94 bpm    Heart Rate (Exercise) 122 bpm    Heart Rate (Exit) 96 bpm    Oxygen Saturation (Admit) 95 %    Oxygen Saturation (Exercise) 93 %    Oxygen Saturation (Exit) 96 %    Rating of Perceived Exertion (Exercise) 7    Perceived Dyspnea (Exercise) 0    Symptoms none    Comments walk test results           Nutrition:  Target Goals: Understanding of nutrition guidelines, daily intake of sodium 1500mg , cholesterol 200mg , calories 30% from fat and 7% or less from saturated fats, daily to have 5 or more servings of fruits and vegetables.  Education: All About Nutrition: -Group instruction provided by verbal, written material, interactive activities, discussions, models, and posters to present general guidelines for heart healthy nutrition including fat, fiber, MyPlate, the role of sodium in heart healthy nutrition, utilization of the nutrition label, and utilization of this knowledge for meal planning. Follow up email sent as well. Written material given at graduation. Flowsheet Row Pulmonary Rehab from 01/17/2021 in [facility] Cardiac and Pulmonary Rehab  Education need identified 01/17/21      Biometrics:  Pre Biometrics - 01/17/21 1432      Pre Biometrics   Height 6' (1.829 m)     Weight 242 lb 3.2 oz (109.9 kg)    BMI (Calculated) 32.84    Single Leg Stand 30 seconds            Nutrition Therapy Plan and Nutrition Goals:   Nutrition Assessments:  MEDIFICTS Score Key:  ?70 Need to make dietary changes   40-70 Heart Healthy Diet  ? 40 Therapeutic Level Cholesterol Diet  Flowsheet Row Pulmonary Rehab from 01/17/2021 in [facility] Cardiac and Pulmonary Rehab  Picture Your Plate Total Score on Admission 80     Picture Your Plate Scores:  <40 Unhealthy dietary pattern with much room for improvement.  41-50 Dietary pattern unlikely to meet recommendations for good health and  room for improvement.  51-60 More healthful dietary pattern, with some room for improvement.   >60 Healthy dietary pattern, although there may be some specific behaviors that could be improved.   Nutrition Goals Re-Evaluation:   Nutrition Goals Discharge (Final Nutrition Goals Re-Evaluation):   Psychosocial: Target Goals: Acknowledge presence or absence of significant depression and/or stress, maximize coping skills, provide positive support system. Participant is able to verbalize types and ability to use techniques and skills needed for reducing stress and depression.   Education: Stress, Anxiety, and Depression - Group verbal and visual presentation to define topics covered.  Reviews how body is impacted by stress, anxiety, and depression.  Also discusses healthy ways to reduce stress and to treat/manage anxiety and depression.  Written material given at graduation.   Education: Sleep Hygiene -Provides group verbal and written instruction about how sleep can affect your health.  Define sleep hygiene, discuss sleep cycles and impact of sleep habits. Review good sleep hygiene tips.    Initial Review & Psychosocial Screening:  Initial Psych Review & Screening - 01/12/21 1012      Initial Review   Current issues with None Identified      Family Dynamics   Good Support  System? Yes   Family and friends     Barriers   Psychosocial barriers to participate in program There are no identifiable barriers or psychosocial needs.;The patient should benefit from training in stress management and relaxation.      Screening Interventions   Interventions Encouraged to exercise;To provide support and resources with identified psychosocial needs;Provide feedback about the scores to participant    Expected Outcomes Short Term goal: Utilizing psychosocial counselor, staff and physician to assist with identification of specific Stressors or current issues interfering with healing process. Setting desired goal for each stressor or current issue identified.;Long Term Goal: Stressors or current issues are controlled or eliminated.;Short Term goal: Identification and review with participant of any Quality of Life or Depression concerns found by scoring the questionnaire.;Long Term goal: The participant improves quality of Life and PHQ9 Scores as seen by post scores and/or verbalization of changes           Quality of Life Scores:  Quality of Life - 01/17/21 1432      Quality of Life   Select Quality of Life      Quality of Life Scores   Health/Function Pre 25.23 %    Socioeconomic Pre 30 %    Psych/Spiritual Pre 28.29 %    Family Pre 21 %    GLOBAL Pre 26.46 %          Scores of 19 and below usually indicate a poorer quality of life in these areas.  A difference of  2-3 points is a clinically meaningful difference.  A difference of 2-3 points in the total score of the Quality of Life Index has been associated with significant improvement in overall quality of life, self-image, physical symptoms, and general health in studies assessing change in quality of life.  PHQ-9: Recent Review Flowsheet Data    Depression screen [facility] 2/9 01/17/2021 06/23/2020 01/27/2018 12/01/2017 09/15/2017   Decreased Interest 2 0 0 0 0   Down, Depressed, Hopeless 0 0 0 3 1   PHQ - 2 Score 2 0 0 3 1    Altered sleeping 2 0 - 3 -   Tired, decreased energy 3 1 - 3 -   Change in appetite 2 0 - 1 -   Feeling  bad or failure about yourself  0 0 - 3 -   Trouble concentrating 1 0 - 1 -   Moving slowly or fidgety/restless 0 0 - 0 -   Suicidal thoughts 0 0 - 3 -   PHQ-9 Score 10 1 - 17 -   Difficult doing work/chores Somewhat difficult Not difficult at all - Extremely dIfficult -     Interpretation of Total Score  Total Score Depression Severity:  1-4 = Minimal depression, 5-9 = Mild depression, 10-14 = Moderate depression, 15-19 = Moderately severe depression, 20-27 = Severe depression   Psychosocial Evaluation and Intervention:  Psychosocial Evaluation - 01/12/21 1025      Psychosocial Evaluation & Interventions   Interventions Encouraged to exercise with the program and follow exercise prescription    Comments Fredrik has no barriers to attending the program. His support system his his family and friends. A t the moment he lives with his Dad. He is hoping to get healthier by attending the program. He also want s to lose some weight,especially some of the belly weight.  He is ready to start the program and should do well.    Expected Outcomes STG: Ren attends all scheduled sessions and the education classes. LTG: Javier is able to continue the progress he gained while in the program.    Continue Psychosocial Services  Follow up required by staff           Psychosocial Re-Evaluation:   Psychosocial Discharge (Final Psychosocial Re-Evaluation):   Education: Education Goals: Education classes will be provided on a weekly basis, covering required topics. Participant will state understanding/return demonstration of topics presented.  Learning Barriers/Preferences:  Learning Barriers/Preferences - 01/12/21 1014      Learning Barriers/Preferences   Learning Barriers None    Learning Preferences Skilled Demonstration           General Pulmonary Education Topics:  Infection  Prevention: - Provides verbal and written material to individual with discussion of infection control including proper hand washing and proper equipment cleaning during exercise session. Flowsheet Row Pulmonary Rehab from 01/17/2021 in [facility] Cardiac and Pulmonary Rehab  Date 01/17/21  Educator [facility]  Instruction Review Code 1- Verbalizes Understanding      Falls Prevention: - Provides verbal and written material to individual with discussion of falls prevention and safety. Flowsheet Row Pulmonary Rehab from 01/17/2021 in [facility] Cardiac and Pulmonary Rehab  Date 01/12/21  Educator SB  Instruction Review Code 1- Verbalizes Understanding      Chronic Lung Disease Review: - Group verbal instruction with posters, models, PowerPoint presentations and videos,  to review new updates, new respiratory medications, new advancements in procedures and treatments. Providing information on websites and "800" numbers for continued self-education. Includes information about supplement oxygen, available portable oxygen systems, continuous and intermittent flow rates, oxygen safety, concentrators, and Medicare reimbursement for oxygen. Explanation of Pulmonary Drugs, including class, frequency, complications, importance of spacers, rinsing mouth after steroid MDI's, and proper cleaning methods for nebulizers. Review of basic lung anatomy and physiology related to function, structure, and complications of lung disease. Review of risk factors. Discussion about methods for diagnosing sleep apnea and types of masks and machines for OSA. Includes a review of the use of types of environmental controls: home humidity, furnaces, filters, dust mite/pet prevention, HEPA vacuums. Discussion about weather changes, air quality and the benefits of nasal washing. Instruction on Warning signs, infection symptoms, calling MD promptly, preventive modes, and value of vaccinations. Review of effective airway clearance, coughing  and/or vibration  techniques. Emphasizing that all should Create an Action Plan. Written material given at graduation.   AED/CPR: - Group verbal and written instruction with the use of models to demonstrate the basic use of the AED with the basic ABC's of resuscitation.    Anatomy and Cardiac Procedures: - Group verbal and visual presentation and models provide information about basic cardiac anatomy and function. Reviews the testing methods done to diagnose heart disease and the outcomes of the test results. Describes the treatment choices: Medical Management, Angioplasty, or Coronary Bypass Surgery for treating various heart conditions including Myocardial Infarction, Angina, Valve Disease, and Cardiac Arrhythmias.  Written material given at graduation. Flowsheet Row Pulmonary Rehab from 01/17/2021 in [facility] Cardiac and Pulmonary Rehab  Education need identified 01/17/21      Medication Safety: - Group verbal and visual instruction to review commonly prescribed medications for heart and lung disease. Reviews the medication, class of the drug, and side effects. Includes the steps to properly store meds and maintain the prescription regimen.  Written material given at graduation.   Other: -Provides group and verbal instruction on various topics (see comments)   Knowledge Questionnaire Score:  Knowledge Questionnaire Score - 01/17/21 1433      Knowledge Questionnaire Score   Pre Score Cardiac 22/26 (Angina, nutrition, exericse, PAD) Pulmonary 15/18 (O2 safety)            Core Components/Risk Factors/Patient Goals at Admission:  Personal Goals and Risk Factors at Admission - 01/17/21 1435      Core Components/Risk Factors/Patient Goals on Admission    Weight Management Yes;Weight Loss;Obesity    Intervention Weight Management: Develop a combined nutrition and exercise program designed to reach desired caloric intake, while maintaining appropriate intake of nutrient and fiber, sodium and fats, and  appropriate energy expenditure required for the weight goal.;Weight Management: Provide education and appropriate resources to help participant work on and attain dietary goals.;Weight Management/Obesity: Establish reasonable short term and long term weight goals.;Obesity: Provide education and appropriate resources to help participant work on and attain dietary goals.    Admit Weight 242 lb 3.2 oz (109.9 kg)    Goal Weight: Short Term 235 lb (106.6 kg)    Goal Weight: Long Term 200 lb (90.7 kg)    Expected Outcomes Short Term: Continue to assess and modify interventions until short term weight is achieved;Long Term: Adherence to nutrition and physical activity/exercise program aimed toward attainment of established weight goal;Weight Loss: Understanding of general recommendations for a balanced deficit meal plan, which promotes 1-2 lb weight loss per week and includes a negative energy balance of [phone] kcal/d;Understanding recommendations for meals to include 15-35% energy as protein, 25-35% energy from fat, 35-60% energy from carbohydrates, less than 200mg  of dietary cholesterol, 20-35 gm of total fiber daily;Understanding of distribution of calorie intake throughout the day with the consumption of 4-5 meals/snacks    Diabetes Yes    Intervention Provide education about signs/symptoms and action to take for hypo/hyperglycemia.;Provide education about proper nutrition, including hydration, and aerobic/resistive exercise prescription along with prescribed medications to achieve blood glucose in normal ranges: Fasting glucose 65-99 mg/dL    Expected Outcomes Short Term: Participant verbalizes understanding of the signs/symptoms and immediate care of hyper/hypoglycemia, proper foot care and importance of medication, aerobic/resistive exercise and nutrition plan for blood glucose control.;Long Term: Attainment of HbA1C < 7%.    Heart Failure Yes    Intervention Provide a combined exercise and nutrition  program that is supplemented with education,  support and counseling about heart failure. Directed toward relieving symptoms such as shortness of breath, decreased exercise tolerance, and extremity edema.    Expected Outcomes Improve functional capacity of life;Short term: Attendance in program 2-3 days a week with increased exercise capacity. Reported lower sodium intake. Reported increased fruit and vegetable intake. Reports medication compliance.;Short term: Daily weights obtained and reported for increase. Utilizing diuretic protocols set by physician.;Long term: Adoption of self-care skills and reduction of barriers for early signs and symptoms recognition and intervention leading to self-care maintenance.    Hypertension Yes    Intervention Provide education on lifestyle modifcations including regular physical activity/exercise, weight management, moderate sodium restriction and increased consumption of fresh fruit, vegetables, and low fat dairy, alcohol moderation, and smoking cessation.;Monitor prescription use compliance.    Expected Outcomes Short Term: Continued assessment and intervention until BP is < 140/99mm HG in hypertensive participants. < 130/4mm HG in hypertensive participants with diabetes, heart failure or chronic kidney disease.;Long Term: Maintenance of blood pressure at goal levels.    Lipids Yes    Intervention Provide education and support for participant on nutrition & aerobic/resistive exercise along with prescribed medications to achieve LDL 70mg , HDL >40mg .    Expected Outcomes Short Term: Participant states understanding of desired cholesterol values and is compliant with medications prescribed. Participant is following exercise prescription and nutrition guidelines.;Long Term: Cholesterol controlled with medications as prescribed, with individualized exercise RX and with personalized nutrition plan. Value goals: LDL < 70mg , HDL > 40 mg.           Education:Diabetes -  Individual verbal and written instruction to review signs/symptoms of diabetes, desired ranges of glucose level fasting, after meals and with exercise. Acknowledge that pre and post exercise glucose checks will be done for 3 sessions at entry of program. Flowsheet Row Pulmonary Rehab from 01/17/2021 in [facility] Cardiac and Pulmonary Rehab  Date 01/17/21  Educator [facility]  Instruction Review Code 1- Verbalizes Understanding      Know Your Numbers and Heart Failure: - Group verbal and visual instruction to discuss disease risk factors for cardiac and pulmonary disease and treatment options.  Reviews associated critical values for Overweight/Obesity, Hypertension, Cholesterol, and Diabetes.  Discusses basics of heart failure: signs/symptoms and treatments.  Introduces Heart Failure Zone chart for action plan for heart failure.  Written material given at graduation.   Core Components/Risk Factors/Patient Goals Review:    Core Components/Risk Factors/Patient Goals at Discharge (Final Review):    ITP Comments:  ITP Comments    Row Name 01/12/21 1024 01/17/21 1412         ITP Comments Virtual orientation call completed today. he has an appointment on Date: 01/17/2021 for EP eval and gym Orientation.  Documentation of diagnosis can be found in [facility] Date: 01/17/2021. Completed 6MWT and gym orientation. Initial ITP created and sent for review to Dr. Emily Filbert, Medical Director.             Comments: Initial ITP (also completed Cardiac questionnaires)

## 2021-01-23 ENCOUNTER — Encounter: Payer: Self-pay | Admitting: Nurse Practitioner

## 2021-01-23 ENCOUNTER — Other Ambulatory Visit: Payer: Self-pay

## 2021-01-23 ENCOUNTER — Ambulatory Visit (INDEPENDENT_AMBULATORY_CARE_PROVIDER_SITE_OTHER): Payer: Medicaid Other | Admitting: Nurse Practitioner

## 2021-01-23 VITALS — BP 130/84 | HR 88 | Ht 72.0 in | Wt 243.0 lb

## 2021-01-23 DIAGNOSIS — I5022 Chronic systolic (congestive) heart failure: Secondary | ICD-10-CM | POA: Diagnosis not present

## 2021-01-23 DIAGNOSIS — E785 Hyperlipidemia, unspecified: Secondary | ICD-10-CM

## 2021-01-23 DIAGNOSIS — I10 Essential (primary) hypertension: Secondary | ICD-10-CM | POA: Diagnosis not present

## 2021-01-23 DIAGNOSIS — I2510 Atherosclerotic heart disease of native coronary artery without angina pectoris: Secondary | ICD-10-CM

## 2021-01-23 DIAGNOSIS — Z86711 Personal history of pulmonary embolism: Secondary | ICD-10-CM

## 2021-01-23 DIAGNOSIS — N1831 Chronic kidney disease, stage 3a: Secondary | ICD-10-CM | POA: Diagnosis not present

## 2021-01-23 NOTE — Progress Notes (Signed)
Office Visit    Patient Name: Gabriel [name]. Date of Encounter: 01/23/2021  Primary Care Provider:  Virginia Crews, MD Primary Cardiologist:  [name], MD  Chief Complaint    50 year old male with a history of nonobstructive CAD, chronic chest pain, chronic troponin elevation, HFrEF, nonischemic cardiomyopathy, nonsustained VT, stage III chronic kidney disease, COPD, chronic anticoagulation secondary to unprovoked PE, type 2 diabetes mellitus, and obesity, presents for follow-up related to CHF.  Past Medical History    Past Medical History:  Diagnosis Date  . CAD (coronary artery disease)    a. 04/2015 low risk MV;  b. 12/2016 Cath: minor irregs in LAD/Diag/LCX/OM, RCA 40p/m/d; c. 05/2020 Cath: LM nl, LAD 50d, LCX 30p, OM1 40, RCA 100p w/ L->L collats. CO/CI 3.1/1.3-->Med rx.  . Chest wall pain, chronic   . Chronic Troponin Elevation   . CKD (chronic kidney disease), stage II-III   . COPD (chronic obstructive pulmonary disease) ([place])   . Diabetes mellitus without complication ([place])   . HFrEF (heart failure with reduced ejection fraction) ([place])   . Hypertension    Resolved since weight loss  . Mixed Ischemic & NICM (nonischemic cardiomyopathy) ([place])    a. 03/2015 Echo: EF 45-50%; b. 12/2015 Echo: EF 20-25%; c. 02/2016 Echo: EF 30-35%; d. 11/2016 Echo: EF 40-45%; e. 06/2019 Echo: EF 30-35%; f. 11/2019 Echo: EF 25-30%; g. 05/2020 Echo: EF 35-40%, glob HK, nl RV size/fxn, PASP 44.54mmHg, mod dil LA. Mild to mod MR.  . Myocardial infarct ([place])   . NSVT (nonsustained ventricular tachycardia) ([place])    a. 12/2015 noted on tele-->amio;  b. 12/2015 Event monitor: no VT. Atach noted.  . Obesity (BMI 30.0-34.9)   . Psoriasis   . Syncope    a. 01/2016 - felt to be vasovagal.  . Tobacco abuse    Past Surgical History:  Procedure Laterality Date  . AMPUTATION    . CARDIAC CATHETERIZATION    . FINGER AMPUTATION     Traumatic  . FINGER FRACTURE SURGERY Left   . LEFT HEART CATH  AND CORONARY ANGIOGRAPHY N/A 01/06/2017   Procedure: Left Heart Cath and Coronary Angiography;  Surgeon: Wellington Hampshire, MD;  Location: [place] [facility];  Service: Cardiovascular;  Laterality: N/A;  . RIGHT/LEFT HEART CATH AND CORONARY ANGIOGRAPHY N/A 06/13/2020   Procedure: RIGHT/LEFT HEART CATH AND CORONARY ANGIOGRAPHY;  Surgeon: Nelva Bush, MD;  Location: [place] [facility];  Service: Cardiovascular;  Laterality: N/A;    Allergies  Allergies  Allergen Reactions  . Metformin And Related Nausea And Vomiting  . Prednisone Other (See Comments)    Reaction: Hallucinations    . Zantac [Ranitidine Hcl] Diarrhea and Nausea Only    Night sweats    History of Present Illness    50 year old male with the above past medical history including chronic chest pain, chronic troponin elevation, HFrEF, mixed ischemic and nonischemic cardiomyopathy, nonobstructive CAD, nonsustained VT, stage III chronic kidney disease, COPD, unprovoked PE on chronic anticoagulation, type 2 diabetes mellitus, and obesity.  Over the years, he has had multiple hospitalizations for management of chronic HFrEF, chest pain, and elevated troponins.  Prior stress testing in 2016 was nonischemic.  In October 2021, he was admitted chest pain and elevated troponin above prior baseline.  Catheterization revealed chronic total occlusion of the right coronary artery with left-to-right collaterals.  He was medically managed and has since been followed intermittently in the advanced heart failure clinic in [place].  Most recent  echo showed an EF of 35 to 40% with global hypokinesis in October 2021.  In April 2022, he was admitted to [facility] secondary to near syncope.  He was found to have acute kidney injury with creatinine of 2.44 (baseline 1.2).  Torsemide dose was reduced to 40 mg daily and was subsequently discharged but then returned to the emergency department the following day with weakness and hypotension.  He  was subsequently admitted for acute kidney injury, dyspnea, and chronic HFrEF.  He was discharged home off of Entresto and spironolactone with reduced dose of torsemide.  [name] was added back in the outpatient setting and at his last visit on April 21, he felt well on both Entresto and torsemide 20 mg.  Since his last visit, he reports feeling well.  He has started at pulmonary rehab.  He notes that he has been frequently urinating throughout the day and as result, he cut his torsemide dose back to 20 mg 4 times a week.  I note today, that he is up 10 pounds since April 21.  He has noticed similar on his scale at home but notes that he has been feeling well despite the weight gain.  He denies chest pain, dyspnea, palpitations, PND, orthopnea, dizziness, syncope, edema, or early satiety.  He does not think that his abdomen is any more bloated or tight than usual.  He is due for a follow-up basic metabolic panel today.  Home Medications    Prior to Admission medications   Medication Sig Start Date End Date Taking? Authorizing Provider  carvedilol (COREG) 6.25 MG tablet Take 1 tablet (6.25 mg total) by mouth 2 (two) times daily with a meal. 08/02/20   Larey Dresser, MD  cetirizine (ZYRTEC) 10 MG tablet Take 10 mg by mouth daily.    [provider]  COMBIVENT RESPIMAT 20-100 MCG/ACT AERS respimat Inhale 1 puff into the lungs 4 (four) times daily. 10/26/20   [provider]  ENTRESTO 49-51 MG Take 1 tablet by mouth at bedtime. 11/30/20   [provider]  fenofibrate (TRICOR) 145 MG tablet Take 1 tablet (145 mg total) by mouth daily. 06/09/20   Lorella Nimrod, MD  HUMALOG KWIKPEN 100 UNIT/ML KwikPen Inject 3 Units into the skin 3 (three) times daily. 11/27/20   [provider]  Insulin Glargine (BASAGLAR KWIKPEN) 100 UNIT/ML Inject 28 Units into the skin at bedtime. 06/09/20   Lorella Nimrod, MD  nitroGLYCERIN (NITROSTAT) 0.4 MG SL tablet Place 1 tablet (0.4 mg total)  under the tongue every 5 (five) minutes x 3 doses as needed for chest pain. 06/09/20   Lorella Nimrod, MD  rivaroxaban (XARELTO) 20 MG TABS tablet Take 1 tablet (20 mg total) by mouth daily with supper. 11/07/20   Cammie Sickle, MD  rosuvastatin (CRESTOR) 20 MG tablet Take 1 tablet (20 mg total) by mouth at bedtime. 06/09/20   Lorella Nimrod, MD  torsemide (DEMADEX) 20 MG tablet Take 1 tablet (20 mg total) by mouth daily. 11/27/20   Dessa Phi, DO  TRULICITY 8.78 MV/6.7MC SOPN INJECT 0.75 MG INTO THE SKIN ONCE A WEEK. 09/28/20   Trinna Post, PA-C    Review of Systems    10 pound weight gain since late April.  He denies chest pain, palpitations, dyspnea, pnd, orthopnea, n, v, dizziness, syncope, edema, weight gain, or early satiety.  All other systems reviewed and are otherwise negative except as noted above.  Physical Exam    VS:  BP 130/84 (  BP Location: Left Arm, Patient Position: Sitting, Cuff Size: Large)   Pulse 88   Ht 6' (1.829 m)   Wt 243 lb (110.2 kg)   SpO2 97%   BMI 32.96 kg/m  , BMI Body mass index is 32.96 kg/m. GEN: Well nourished, well developed, in no acute distress. HEENT: normal. Neck: Supple, no JVD, carotid bruits, or masses. Cardiac: RRR, no murmurs, rubs, or gallops. No clubbing, cyanosis, edema.  Radials/DP/PT 2+ and equal bilaterally.  Respiratory:  Respirations regular and unlabored, clear to auscultation bilaterally. GI: Obese, soft, nontender, nondistended, BS + x 4. MS: no deformity or atrophy. Skin: warm and dry, no rash. Neuro:  Strength and sensation are intact. Psych: Normal affect.  Accessory Clinical Findings    ECG personally reviewed by me today -regular sinus rhythm, 88, left atrial enlargement, mildly prolonged QTc- no acute changes.  Lab Results  Component Value Date   WBC 9.0 11/28/2020   HGB 17.1 (H) 11/28/2020   HCT 50.7 11/28/2020   MCV 82.0 11/28/2020   PLT 172 11/28/2020   Lab Results  Component Value Date    CREATININE 1.57 (H) 12/04/2020   BUN 38 (H) 12/04/2020   NA 135 12/04/2020   K 3.7 12/04/2020   CL 93 (L) 12/04/2020   CO2 29 12/04/2020   Lab Results  Component Value Date   ALT 21 11/28/2020   AST 25 11/28/2020   ALKPHOS 57 11/28/2020   BILITOT 1.1 11/28/2020   Lab Results  Component Value Date   CHOL 185 11/23/2020   HDL 19 (L) 11/23/2020   LDLCALC UNABLE TO CALCULATE IF TRIGLYCERIDE OVER 400 mg/dL 11/23/2020   LDLDIRECT 60.6 11/23/2020   TRIG 900 (H) 11/23/2020   CHOLHDL 9.7 11/23/2020    Lab Results  Component Value Date   HGBA1C 7.1 (H) 10/27/2020    Assessment & Plan    1.  Chronic heart failure with reduced ejection fraction/mixed ischemic and nonischemic cardiomyopathy: EF 35-40% by echocardiogram in October 2021.  Since his last visit on April 21, his weight is up 10 pounds.  He denies symptoms, edema, or change in abdominal girth, but admits to reducing his weekly torsemide dose to 20 mg 4 times a week in the setting of polyuria at home.  On exam, he does not appear to be particularly volume overloaded however, I remain concerned about his weight gain given his heart failure history.  I am going to follow-up a basic metabolic panel today and have encouraged him to take torsemide 20 mg daily unless labs today direct is otherwise.  He otherwise remains on Entresto and carvedilol.  Given weight gain and adjustment to his torsemide, I will not add either spironolactone or dapagliflozin at this time but will continue to look for an opportunity to add in the future.  2.  Coronary artery disease: Diagnostic catheterization October 2021 showed total occlusion of the right coronary artery with left-to-right collaterals.  He has been medically managed and denies any chest pain.  He remains on beta-blocker and statin therapy.  No aspirin in the setting of chronic Xarelto.  3.  Stage III chronic kidney disease: Baseline creatinine approximately 1.2.  He was admitted in April with a  creatinine of 2.44 and orthostasis.  Most recent creatinine was stable at 1.57 on April 18.  He is due for follow-up labs today.  4.  Essential hypertension: Blood pressure is mildly elevated today in the setting of 10 pound weight gain.  Resuming torsemide at 20 mg  daily (was only taking 4 times a week).  Continue beta-blocker and Entresto.  5.  History of unprovoked PE/DVT: On chronic Xarelto.  May need dose adjustment pending renal status.  6.  Hyperlipidemia/hypertriglyceridemia: Remains on statin and Tricor.  7.  History of orthostasis: Resolved.  8.  Disposition: Follow-up basic metabolic panel today.  Follow-up in clinic in 2 months or sooner if necessary.  He will call us if his weight continues to rise despite adjustment to diuretic.  Murray Hodgkins, NP 01/23/2021, 8:58 AM

## 2021-01-23 NOTE — Patient Instructions (Signed)
Medication Instructions:  No changes at this time.  *If you need a refill on your cardiac medications before your next appointment, please call your pharmacy*   Lab Work: BMET today  If you have labs (blood work) drawn today and your tests are completely normal, you will receive your results only by: Marland Kitchen MyChart Message (if you have MyChart) OR . A paper copy in the mail If you have any lab test that is abnormal or we need to change your treatment, we will call you to review the results.   Testing/Procedures: None   Follow-Up: At [facility], you and your health needs are our priority.  As part of our continuing mission to provide you with exceptional heart care, we have created designated Provider Care Teams.  These Care Teams include your primary Cardiologist (physician) and Advanced Practice Providers (APPs -  Physician Assistants and Nurse Practitioners) who all work together to provide you with the care you need, when you need it.  We recommend signing up for the patient portal called "MyChart".  Sign up information is provided on this After Visit Summary.  MyChart is used to connect with patients for Virtual Visits (Telemedicine).  Patients are able to view lab/test results, encounter notes, upcoming appointments, etc.  Non-urgent messages can be sent to your provider as well.   To learn more about what you can do with MyChart, go to [email or web address].    Your next appointment:   2 month(s)  The format for your next appointment:   In Person  Provider:   [name], MD or Murray Hodgkins, NP

## 2021-01-24 LAB — BASIC METABOLIC PANEL
BUN/Creatinine Ratio: 13 (ref 9–20)
BUN: 16 mg/dL (ref 6–24)
CO2: 21 mmol/L (ref 20–29)
Calcium: 9.5 mg/dL (ref 8.7–10.2)
Chloride: 103 mmol/L (ref 96–106)
Creatinine, Ser: 1.21 mg/dL (ref 0.76–1.27)
Glucose: 220 mg/dL — ABNORMAL HIGH (ref 65–99)
Potassium: 4.8 mmol/L (ref 3.5–5.2)
Sodium: 138 mmol/L (ref 134–144)
eGFR: 72 mL/min/{1.73_m2} (ref >59)

## 2021-01-26 ENCOUNTER — Other Ambulatory Visit: Payer: Self-pay

## 2021-01-26 DIAGNOSIS — I5042 Chronic combined systolic (congestive) and diastolic (congestive) heart failure: Secondary | ICD-10-CM

## 2021-01-26 LAB — GLUCOSE, CAPILLARY
Glucose-Capillary: 220 mg/dL — ABNORMAL HIGH (ref 70–99)
Glucose-Capillary: 220 mg/dL — ABNORMAL HIGH (ref 70–99)

## 2021-01-26 NOTE — Progress Notes (Signed)
Daily Session Note  Patient Details  Name: Gabriel [name]. MRN: [id number] Date of Birth: March 25, 1971 Referring Provider:   Flowsheet Row Pulmonary Rehab from 01/17/2021 in [facility] Cardiac and Pulmonary Rehab  Referring Provider [name] MD       Encounter Date: 01/26/2021  Check In:  Session Check In - 01/26/21 0839       Check-In   Supervising physician immediately available to respond to emergencies See telemetry face sheet for immediately available ER MD    Location [facility]-Cardiac & Pulmonary Rehab    Staff Present Justin Mend RCP,RRT,BSRT;Vida Rigger RN, BSN;Jessica Luan Pulling, MA, RCEP, CCRP, CCET    Virtual Visit No    Fall or balance concerns reported    No    Warm-up and Cool-down Performed on first and last piece of equipment    Resistance Training Performed Yes    VAD Patient? No    PAD/SET Patient? No      Pain Assessment   Currently in Pain? No/denies                Social History   Tobacco Use  Smoking Status Former   Packs/day: 0.50   Years: 33.00   Pack years: 16.50   Types: Cigarettes   Quit date: 05/08/2020   Years since quitting: 0.7  Smokeless Tobacco Former   Quit date: 05/08/2020  Tobacco Comments   Quit Sept 2021    Goals Met:  Proper associated with RPD/PD & O2 Sat Independence with exercise equipment Exercise tolerated well Personal goals reviewed Queuing for purse lip breathing No report of cardiac concerns or symptoms Strength training completed today  Goals Unmet:  Not Applicable  Comments: First full day of exercise!  Patient was oriented to gym and equipment including functions, settings, policies, and procedures.  Patient's individual exercise prescription and treatment plan were reviewed.  All starting workloads were established based on the results of the 6 minute walk test done at initial orientation visit.  The plan for exercise progression was also introduced and progression will be customized based on patient's  performance and goals.    Dr. Emily Filbert is Medical Director for [facility].  Dr. Ottie Glazier is Medical Director for [facility] Pulmonary Rehabilitation.

## 2021-01-30 ENCOUNTER — Other Ambulatory Visit: Payer: Self-pay

## 2021-01-30 DIAGNOSIS — I5040 Unspecified combined systolic (congestive) and diastolic (congestive) heart failure: Secondary | ICD-10-CM

## 2021-01-30 DIAGNOSIS — I5042 Chronic combined systolic (congestive) and diastolic (congestive) heart failure: Secondary | ICD-10-CM

## 2021-01-30 LAB — GLUCOSE, CAPILLARY: Glucose-Capillary: 218 mg/dL — ABNORMAL HIGH (ref 70–99)

## 2021-01-30 NOTE — Progress Notes (Addendum)
Daily Session Note  Patient Details  Name: Gabriel [name]. MRN: [id number] Date of Birth: 09/29/1970 Referring Provider:   Flowsheet Row Pulmonary Rehab from 01/17/2021 in [facility]  Referring Provider [name] MD       Encounter Date: 01/30/2021  Check In:  Session Check In - 01/30/21 0731       Check-In   Supervising physician immediately available to respond to emergencies See telemetry face sheet for immediately available ER MD    Location [facility]-Cardiac & Pulmonary Rehab    Staff Present Birdie Sons, MPA, RN;Laureen Owens Shark, BS, RRT, CPFT;Kara Eliezer Bottom, MS, ASCM CEP, Exercise Physiologist    Virtual Visit No    Medication changes reported     No    Fall or balance concerns reported    No    Warm-up and Cool-down Performed on first and last piece of equipment    Resistance Training Performed Yes    VAD Patient? No    PAD/SET Patient? No      Pain Assessment   Currently in Pain? No/denies                Social History   Tobacco Use  Smoking Status Former   Packs/day: 0.50   Years: 33.00   Pack years: 16.50   Types: Cigarettes   Quit date: 05/08/2020   Years since quitting: 0.7  Smokeless Tobacco Former   Quit date: 05/08/2020  Tobacco Comments   Quit Sept 2021    Goals Met:  Independence with exercise equipment No report of cardiac concerns or symptoms Strength training completed today  Goals Unmet:  Not Applicable  Comments: Pt was unable to follow exercise prescription today due to knee pain exacerbated with exercise. Began with 3/10 knee pain, increasing to 8/10. Pt advised to consult with physician concerning knee pain.  Was able to exercise 34 minutes.    Dr. Emily Filbert is Medical Director for [facility].  Dr. Ottie Glazier is Medical Director for [facility] Pulmonary Rehabilitation.

## 2021-01-31 ENCOUNTER — Encounter: Payer: Self-pay | Admitting: *Deleted

## 2021-01-31 DIAGNOSIS — I5042 Chronic combined systolic (congestive) and diastolic (congestive) heart failure: Secondary | ICD-10-CM

## 2021-01-31 NOTE — Progress Notes (Signed)
Pulmonary Individual Treatment Plan  Patient Details  Name: Gabriel [name]. MRN: [id number] Date of Birth: July 25, 1971 Referring Provider:   Flowsheet Row Pulmonary Rehab from 01/17/2021 in [facility] Cardiac and Pulmonary Rehab  Referring Provider [name] MD       Initial Encounter Date:  Flowsheet Row Pulmonary Rehab from 01/17/2021 in [facility] Cardiac and Pulmonary Rehab  Date 01/17/21       Visit Diagnosis: Chronic combined systolic and diastolic congestive heart failure ([place])  Patient's Home Medications on Admission:  Current Outpatient Medications:    carvedilol (COREG) 6.25 MG tablet, Take 1 tablet (6.25 mg total) by mouth 2 (two) times daily with a meal., Disp: 180 tablet, Rfl: 3   cetirizine (ZYRTEC) 10 MG tablet, Take 10 mg by mouth daily., Disp: , Rfl:    ENTRESTO 49-51 MG, Take 1 tablet by mouth at bedtime., Disp: , Rfl:    fenofibrate (TRICOR) 145 MG tablet, Take 1 tablet (145 mg total) by mouth daily., Disp: 30 tablet, Rfl: 6   HUMALOG KWIKPEN 100 UNIT/ML KwikPen, Inject 3 Units into the skin 3 (three) times daily., Disp: , Rfl:    Insulin Glargine (BASAGLAR KWIKPEN) 100 UNIT/ML, Inject 28 Units into the skin at bedtime., Disp: 15 mL, Rfl: 2   Ipratropium-Albuterol (COMBIVENT RESPIMAT) 20-100 MCG/ACT AERS respimat, Inhale 1 puff into the lungs every 6 (six) hours as needed for wheezing., Disp: , Rfl:    nitroGLYCERIN (NITROSTAT) 0.4 MG SL tablet, Place 1 tablet (0.4 mg total) under the tongue every 5 (five) minutes x 3 doses as needed for chest pain., Disp: 30 tablet, Rfl: 0   rivaroxaban (XARELTO) 20 MG TABS tablet, Take 1 tablet (20 mg total) by mouth daily with supper., Disp: 30 tablet, Rfl: 0   rosuvastatin (CRESTOR) 20 MG tablet, Take 1 tablet (20 mg total) by mouth at bedtime., Disp: 30 tablet, Rfl: 6   torsemide (DEMADEX) 20 MG tablet, Take 1 tablet (20 mg total) by mouth daily., Disp: 30 tablet, Rfl: 0   TRULICITY 8.92 JJ/9.4RD SOPN, INJECT 0.75 MG INTO THE SKIN  ONCE A WEEK., Disp: 2 mL, Rfl: 2  Past Medical History: Past Medical History:  Diagnosis Date   CAD (coronary artery disease)    a. 04/2015 low risk MV;  b. 12/2016 Cath: minor irregs in LAD/Diag/LCX/OM, RCA 40p/m/d; c. 05/2020 Cath: LM nl, LAD 50d, LCX 30p, OM1 40, RCA 100p w/ L->L collats. CO/CI 3.1/1.3-->Med rx.   Chest wall pain, chronic    Chronic Troponin Elevation    CKD (chronic kidney disease), stage II-III    COPD (chronic obstructive pulmonary disease) (HCC)    Diabetes mellitus without complication ([place])    HFrEF (heart failure with reduced ejection fraction) ([place])    Hypertension    Resolved since weight loss   Mixed Ischemic & NICM (nonischemic cardiomyopathy) ([place])    a. 03/2015 Echo: EF 45-50%; b. 12/2015 Echo: EF 20-25%; c. 02/2016 Echo: EF 30-35%; d. 11/2016 Echo: EF 40-45%; e. 06/2019 Echo: EF 30-35%; f. 11/2019 Echo: EF 25-30%; g. 05/2020 Echo: EF 35-40%, glob HK, nl RV size/fxn, PASP 44.81mmHg, mod dil LA. Mild to mod MR.   Myocardial infarct [facility])    NSVT (nonsustained ventricular tachycardia) ([place])    a. 12/2015 noted on tele-->amio;  b. 12/2015 Event monitor: no VT. Atach noted.   Obesity (BMI 30.0-34.9)    Psoriasis    Syncope    a. 01/2016 - felt to be vasovagal.   Tobacco abuse  Tobacco Use: Social History   Tobacco Use  Smoking Status Former   Packs/day: 0.50   Years: 33.00   Pack years: 16.50   Types: Cigarettes   Quit date: 05/08/2020   Years since quitting: 0.7  Smokeless Tobacco Former   Quit date: 05/08/2020  Tobacco Comments   Quit Sept 2021    Labs: Recent Review Flowsheet Data     Labs for ITP Cardiac and Pulmonary Rehab Latest Ref Rng & Units 06/07/2020 06/11/2020 06/23/2020 10/27/2020 11/23/2020   Cholestrol 0 - 200 mg/dL - 126 - - 185   LDLCALC 0 - 99 mg/dL - 75 - - UNABLE TO CALCULATE IF TRIGLYCERIDE OVER 400 mg/dL   LDLDIRECT 0 - 99 mg/dL - - - - 60.6   HDL >40 mg/dL - 25(L) - - 19(L)   Trlycerides <150 mg/dL - 130 - - 900(H)    Hemoglobin A1c 4.8 - 5.6 % 9.3(H) - 9.8(A) 7.1(H) -   PHART 7.350 - 7.450 - - - - -   PCO2ART 32.0 - 48.0 mmHg - - - - -   HCO3 20.0 - 28.0 mmol/L - - - - 33.5(H)   TCO2 0 - 100 mmol/L - - - - -   ACIDBASEDEF 0.0 - 2.0 mmol/L - - - - -   O2SAT % - - - - 86.9        Pulmonary Assessment Scores:  Pulmonary Assessment Scores     Row Name 01/17/21 1436         ADL UCSD   ADL Phase Entry     SOB Score total 12     Rest 0     Walk 1     Stairs 1     Bath 1     Dress 0     Shop 1           CAT Score     CAT Score 10           mMRC Score     mMRC Score 0             UCSD: Self-administered rating of dyspnea associated with activities of daily living (ADLs) 6-point scale (0 = "not at all" to 5 = "maximal or unable to do because of breathlessness")  Scoring Scores range from 0 to 120.  Minimally important difference is 5 units  CAT: CAT can identify the health impairment of COPD patients and is better correlated with disease progression.  CAT has a scoring range of zero to 40. The CAT score is classified into four groups of low (less than 10), medium (10 - 20), high (21-30) and very high (31-40) based on the impact level of disease on health status. A CAT score over 10 suggests significant symptoms.  A worsening CAT score could be explained by an exacerbation, poor medication adherence, poor inhaler technique, or progression of COPD or comorbid conditions.  CAT MCID is 2 points  mMRC: mMRC (Modified Medical Research Council) Dyspnea Scale is used to assess the degree of baseline functional disability in patients of respiratory disease due to dyspnea. No minimal important difference is established. A decrease in score of 1 point or greater is considered a positive change.   Pulmonary Function Assessment:   Exercise Target Goals: Exercise Program Goal: Individual exercise prescription set using results from initial 6 min walk test and THRR while considering  patient's  activity barriers and safety.   Exercise Prescription Goal: Initial exercise prescription builds to 30-45  minutes a day of aerobic activity, 2-3 days per week.  Home exercise guidelines will be given to patient during program as part of exercise prescription that the participant will acknowledge.  Education: Aerobic Exercise: - Group verbal and visual presentation on the components of exercise prescription. Introduces F.I.T.T principle from ACSM for exercise prescriptions.  Reviews F.I.T.T. principles of aerobic exercise including progression. Written material given at graduation. Flowsheet Row Pulmonary Rehab from 01/17/2021 in [facility] Cardiac and Pulmonary Rehab  Education need identified 01/17/21       Education: Resistance Exercise: - Group verbal and visual presentation on the components of exercise prescription. Introduces F.I.T.T principle from ACSM for exercise prescriptions  Reviews F.I.T.T. principles of resistance exercise including progression. Written material given at graduation.    Education: Exercise & Equipment Safety: - Individual verbal instruction and demonstration of equipment use and safety with use of the equipment. Flowsheet Row Pulmonary Rehab from 01/17/2021 in [facility] Cardiac and Pulmonary Rehab  Date 01/17/21  Educator [facility]  Instruction Review Code 1- Verbalizes Understanding       Education: Exercise Physiology & General Exercise Guidelines: - Group verbal and written instruction with models to review the exercise physiology of the cardiovascular system and associated critical values. Provides general exercise guidelines with specific guidelines to those with heart or lung disease.    Education: Flexibility, Balance, Mind/Body Relaxation: - Group verbal and visual presentation with interactive activity on the components of exercise prescription. Introduces F.I.T.T principle from ACSM for exercise prescriptions. Reviews F.I.T.T. principles of flexibility and balance  exercise training including progression. Also discusses the mind body connection.  Reviews various relaxation techniques to help reduce and manage stress (i.e. Deep breathing, progressive muscle relaxation, and visualization). Balance handout provided to take home. Written material given at graduation.   Activity Barriers & Risk Stratification:  Activity Barriers & Cardiac Risk Stratification - 01/17/21 1421       Activity Barriers & Cardiac Risk Stratification   Activity Barriers Arthritis;Deconditioning;Decreased Ventricular Function             6 Minute Walk:  6 Minute Walk     Row Name 01/17/21 1417         6 Minute Walk   Phase Initial     Distance 1270 feet     Walk Time 6 minutes     # of Rest Breaks 0     MPH 2.41     METS 4.1     RPE 7     Perceived Dyspnea  0     VO2 Peak 14.34     Symptoms No     Resting HR 94 bpm     Resting BP 144/62     Resting Oxygen Saturation  95 %     Exercise Oxygen Saturation  during 6 min walk 93 %     Max Ex. HR 122 bpm     Max Ex. BP 162/74     2 Minute Post BP 122/60           Interval HR     1 Minute HR 109     2 Minute HR 115     3 Minute HR 122     4 Minute HR 106     5 Minute HR 105     6 Minute HR 106     2 Minute Post HR 102     Interval Heart Rate? Yes           Interval Oxygen  Interval Oxygen? Yes     Baseline Oxygen Saturation % 95 %     1 Minute Oxygen Saturation % 94 %     1 Minute Liters of Oxygen 0 L  Room Air     2 Minute Oxygen Saturation % 93 %     2 Minute Liters of Oxygen 0 L     3 Minute Oxygen Saturation % 94 %     3 Minute Liters of Oxygen 0 L     4 Minute Oxygen Saturation % 94 %     4 Minute Liters of Oxygen 0 L     5 Minute Oxygen Saturation % 95 %     5 Minute Liters of Oxygen 0 L     6 Minute Oxygen Saturation % 95 %     6 Minute Liters of Oxygen 0 L     2 Minute Post Oxygen Saturation % 95 %     2 Minute Post Liters of Oxygen 0 L            Oxygen Initial Assessment:   Oxygen Initial Assessment - 01/17/21 1436       Home Oxygen   Home Oxygen Device None    Sleep Oxygen Prescription CPAP    Liters per minute 0    Home Exercise Oxygen Prescription None    Home Resting Oxygen Prescription None    Compliance with Home Oxygen Use Yes      Initial 6 min Walk   Oxygen Used None      Program Oxygen Prescription   Program Oxygen Prescription None      Intervention   Short Term Goals To learn and demonstrate proper pursed lip breathing techniques or other breathing techniques. ;To learn and demonstrate proper use of respiratory medications;To learn and understand importance of maintaining oxygen saturations>88%;To learn and understand importance of monitoring SPO2 with pulse oximeter and demonstrate accurate use of the pulse oximeter.    Long  Term Goals Compliance with respiratory medication;Demonstrates proper use of MDI's;Exhibits proper breathing techniques, such as pursed lip breathing or other method taught during program session;Verbalizes importance of monitoring SPO2 with pulse oximeter and return demonstration;Maintenance of O2 saturations>88%             Oxygen Re-Evaluation:  Oxygen Re-Evaluation     Row Name 01/26/21 0852             Goals/Expected Outcomes   Short Term Goals To learn and demonstrate proper pursed lip breathing techniques or other breathing techniques. ;To learn and understand importance of maintaining oxygen saturations>88%;To learn and understand importance of monitoring SPO2 with pulse oximeter and demonstrate accurate use of the pulse oximeter.       Long  Term Goals Maintenance of O2 saturations>88%;Verbalizes importance of monitoring SPO2 with pulse oximeter and return demonstration;Exhibits proper breathing techniques, such as pursed lip breathing or other method taught during program session       Comments Reviewed PLB technique with pt.  Talked about how it works and it's importance in maintaining their exercise  saturations.       Goals/Expected Outcomes Short: Become more profiecient at using PLB.   Long: Become independent at using PLB.                Oxygen Discharge (Final Oxygen Re-Evaluation):  Oxygen Re-Evaluation - 01/26/21 0852       Goals/Expected Outcomes   Short Term Goals To learn and demonstrate proper pursed lip breathing techniques or other  breathing techniques. ;To learn and understand importance of maintaining oxygen saturations>88%;To learn and understand importance of monitoring SPO2 with pulse oximeter and demonstrate accurate use of the pulse oximeter.    Long  Term Goals Maintenance of O2 saturations>88%;Verbalizes importance of monitoring SPO2 with pulse oximeter and return demonstration;Exhibits proper breathing techniques, such as pursed lip breathing or other method taught during program session    Comments Reviewed PLB technique with pt.  Talked about how it works and it's importance in maintaining their exercise saturations.    Goals/Expected Outcomes Short: Become more profiecient at using PLB.   Long: Become independent at using PLB.             Initial Exercise Prescription:  Initial Exercise Prescription - 01/17/21 1400       Date of Initial Exercise RX and Referring Provider   Date 01/17/21    Referring Provider [name] MD      Treadmill   MPH 2.5    Grade 1    Minutes 15    METs 3.26      Recumbant Bike   Level 3    RPM 50    Watts 73    Minutes 15    METs 3      Recumbant Elliptical   Level 2    RPM 50    Minutes 15    METs 3      REL-XR   Level 3    Speed 50    Minutes 15    METs 3      Prescription Details   Frequency (times per week) 3    Duration Progress to 30 minutes of continuous aerobic without signs/symptoms of physical distress      Intensity   THRR 40-80% of Max Heartrate 124-154    Ratings of Perceived Exertion 11-13    Perceived Dyspnea 0-4      Progression   Progression Continue to progress workloads  to maintain intensity without signs/symptoms of physical distress.      Resistance Training   Training Prescription Yes    Weight 5 lb    Reps 10-15             Perform Capillary Blood Glucose checks as needed.  Exercise Prescription Changes:   Exercise Prescription Changes     Row Name 01/17/21 1400             Response to Exercise   Blood Pressure (Admit) 144/62       Blood Pressure (Exercise) 162/74       Blood Pressure (Exit) 122/60       Heart Rate (Admit) 94 bpm       Heart Rate (Exercise) 122 bpm       Heart Rate (Exit) 96 bpm       Oxygen Saturation (Admit) 95 %       Oxygen Saturation (Exercise) 93 %       Oxygen Saturation (Exit) 96 %       Rating of Perceived Exertion (Exercise) 7       Perceived Dyspnea (Exercise) 0       Symptoms none       Comments walk test results                Exercise Comments:   Exercise Comments     Row Name 01/26/21 0851 01/30/21 0757         Exercise Comments First full day of exercise!  Patient was oriented to  gym and equipment including functions, settings, policies, and procedures.  Patient's individual exercise prescription and treatment plan were reviewed.  All starting workloads were established based on the results of the 6 minute walk test done at initial orientation visit.  The plan for exercise progression was also introduced and progression will be customized based on patient's performance and goals. Pt was unable to follow exercise prescription today due to knee pain exacerbated with exercise. Began with 3/10 knee pain, increasing to 8/10. Pt advised to consult with physician concerning knee pain.  Was able to exercise 34 minutes.               Exercise Goals and Review:   Exercise Goals     Row Name 01/17/21 1431             Exercise Goals   Increase Physical Activity Yes       Intervention Provide advice, education, support and counseling about physical activity/exercise needs.;Develop an  individualized exercise prescription for aerobic and resistive training based on initial evaluation findings, risk stratification, comorbidities and participant's personal goals.       Expected Outcomes Short Term: Attend rehab on a regular basis to increase amount of physical activity.;Long Term: Add in home exercise to make exercise part of routine and to increase amount of physical activity.;Long Term: Exercising regularly at least 3-5 days a week.       Increase Strength and Stamina Yes       Intervention Provide advice, education, support and counseling about physical activity/exercise needs.;Develop an individualized exercise prescription for aerobic and resistive training based on initial evaluation findings, risk stratification, comorbidities and participant's personal goals.       Expected Outcomes Short Term: Increase workloads from initial exercise prescription for resistance, speed, and METs.;Short Term: Perform resistance training exercises routinely during rehab and add in resistance training at home;Long Term: Improve cardiorespiratory fitness, muscular endurance and strength as measured by increased METs and functional capacity (6MWT)       Able to understand and use rate of perceived exertion (RPE) scale Yes       Intervention Provide education and explanation on how to use RPE scale       Expected Outcomes Short Term: Able to use RPE daily in rehab to express subjective intensity level;Long Term:  Able to use RPE to guide intensity level when exercising independently       Able to understand and use Dyspnea scale Yes       Intervention Provide education and explanation on how to use Dyspnea scale       Expected Outcomes Short Term: Able to use Dyspnea scale daily in rehab to express subjective sense of shortness of breath during exertion;Long Term: Able to use Dyspnea scale to guide intensity level when exercising independently       Knowledge and understanding of Target Heart Rate Range  (THRR) Yes       Intervention Provide education and explanation of THRR including how the numbers were predicted and where they are located for reference       Expected Outcomes Short Term: Able to state/look up THRR;Short Term: Able to use daily as guideline for intensity in rehab;Long Term: Able to use THRR to govern intensity when exercising independently       Able to check pulse independently Yes       Intervention Provide education and demonstration on how to check pulse in carotid and radial arteries.;Review the importance of being able to  check your own pulse for safety during independent exercise       Expected Outcomes Short Term: Able to explain why pulse checking is important during independent exercise;Long Term: Able to check pulse independently and accurately       Understanding of Exercise Prescription Yes       Intervention Provide education, explanation, and written materials on patient's individual exercise prescription       Expected Outcomes Short Term: Able to explain program exercise prescription;Long Term: Able to explain home exercise prescription to exercise independently                Exercise Goals Re-Evaluation :  Exercise Goals Re-Evaluation     Row Name 01/26/21 0851             Exercise Goal Re-Evaluation   Exercise Goals Review Increase Physical Activity;Able to understand and use rate of perceived exertion (RPE) scale;Knowledge and understanding of Target Heart Rate Range (THRR);Understanding of Exercise Prescription;Able to understand and use Dyspnea scale;Increase Strength and Stamina;Able to check pulse independently       Comments Reviewed RPE and dyspnea scales, THR and program prescription with pt today.  Pt voiced understanding and was given a copy of goals to take home.       Expected Outcomes Short: Use RPE daily to regulate intensity. Long: Follow program prescription in THR.                Discharge Exercise Prescription (Final  Exercise Prescription Changes):  Exercise Prescription Changes - 01/17/21 1400       Response to Exercise   Blood Pressure (Admit) 144/62    Blood Pressure (Exercise) 162/74    Blood Pressure (Exit) 122/60    Heart Rate (Admit) 94 bpm    Heart Rate (Exercise) 122 bpm    Heart Rate (Exit) 96 bpm    Oxygen Saturation (Admit) 95 %    Oxygen Saturation (Exercise) 93 %    Oxygen Saturation (Exit) 96 %    Rating of Perceived Exertion (Exercise) 7    Perceived Dyspnea (Exercise) 0    Symptoms none    Comments walk test results             Nutrition:  Target Goals: Understanding of nutrition guidelines, daily intake of sodium 1500mg , cholesterol 200mg , calories 30% from fat and 7% or less from saturated fats, daily to have 5 or more servings of fruits and vegetables.  Education: All About Nutrition: -Group instruction provided by verbal, written material, interactive activities, discussions, models, and posters to present general guidelines for heart healthy nutrition including fat, fiber, MyPlate, the role of sodium in heart healthy nutrition, utilization of the nutrition label, and utilization of this knowledge for meal planning. Follow up email sent as well. Written material given at graduation. Flowsheet Row Pulmonary Rehab from 01/17/2021 in [facility] Cardiac and Pulmonary Rehab  Education need identified 01/17/21       Biometrics:  Pre Biometrics - 01/17/21 1432       Pre Biometrics   Height 6' (1.829 m)    Weight 242 lb 3.2 oz (109.9 kg)    BMI (Calculated) 32.84    Single Leg Stand 30 seconds              Nutrition Therapy Plan and Nutrition Goals:   Nutrition Assessments:  MEDIFICTS Score Key: ?70 Need to make dietary changes  40-70 Heart Healthy Diet ? 40 Therapeutic Level Cholesterol Diet  Flowsheet Row Pulmonary Rehab from 01/17/2021  in [facility] Cardiac and Pulmonary Rehab  Picture Your Plate Total Score on Admission 80      Picture Your Plate Scores: <16  Unhealthy dietary pattern with much room for improvement. 41-50 Dietary pattern unlikely to meet recommendations for good health and room for improvement. 51-60 More healthful dietary pattern, with some room for improvement.  >60 Healthy dietary pattern, although there may be some specific behaviors that could be improved.   Nutrition Goals Re-Evaluation:   Nutrition Goals Discharge (Final Nutrition Goals Re-Evaluation):   Psychosocial: Target Goals: Acknowledge presence or absence of significant depression and/or stress, maximize coping skills, provide positive support system. Participant is able to verbalize types and ability to use techniques and skills needed for reducing stress and depression.   Education: Stress, Anxiety, and Depression - Group verbal and visual presentation to define topics covered.  Reviews how body is impacted by stress, anxiety, and depression.  Also discusses healthy ways to reduce stress and to treat/manage anxiety and depression.  Written material given at graduation.   Education: Sleep Hygiene -Provides group verbal and written instruction about how sleep can affect your health.  Define sleep hygiene, discuss sleep cycles and impact of sleep habits. Review good sleep hygiene tips.    Initial Review & Psychosocial Screening:  Initial Psych Review & Screening - 01/12/21 1012       Initial Review   Current issues with None Identified      Family Dynamics   Good Support System? Yes   Family and friends     Barriers   Psychosocial barriers to participate in program There are no identifiable barriers or psychosocial needs.;The patient should benefit from training in stress management and relaxation.      Screening Interventions   Interventions Encouraged to exercise;To provide support and resources with identified psychosocial needs;Provide feedback about the scores to participant    Expected Outcomes Short Term goal: Utilizing psychosocial counselor,  staff and physician to assist with identification of specific Stressors or current issues interfering with healing process. Setting desired goal for each stressor or current issue identified.;Long Term Goal: Stressors or current issues are controlled or eliminated.;Short Term goal: Identification and review with participant of any Quality of Life or Depression concerns found by scoring the questionnaire.;Long Term goal: The participant improves quality of Life and PHQ9 Scores as seen by post scores and/or verbalization of changes             Quality of Life Scores:  Quality of Life - 01/17/21 1432       Quality of Life   Select Quality of Life      Quality of Life Scores   Health/Function Pre 25.23 %    Socioeconomic Pre 30 %    Psych/Spiritual Pre 28.29 %    Family Pre 21 %    GLOBAL Pre 26.46 %            Scores of 19 and below usually indicate a poorer quality of life in these areas.  A difference of  2-3 points is a clinically meaningful difference.  A difference of 2-3 points in the total score of the Quality of Life Index has been associated with significant improvement in overall quality of life, self-image, physical symptoms, and general health in studies assessing change in quality of life.  PHQ-9: Recent Review Flowsheet Data     Depression screen [facility] 2/9 01/17/2021 06/23/2020 01/27/2018 12/01/2017 09/15/2017   Decreased Interest 2 0 0 0 0   Down, Depressed,  Hopeless 0 0 0 3 1   PHQ - 2 Score 2 0 0 3 1   Altered sleeping 2 0 - 3 -   Tired, decreased energy 3 1 - 3 -   Change in appetite 2 0 - 1 -   Feeling bad or failure about yourself  0 0 - 3 -   Trouble concentrating 1 0 - 1 -   Moving slowly or fidgety/restless 0 0 - 0 -   Suicidal thoughts 0 0 - 3 -   PHQ-9 Score 10 1 - 17 -   Difficult doing work/chores Somewhat difficult Not difficult at all - Extremely dIfficult -      Interpretation of Total Score  Total Score Depression Severity:  1-4 = Minimal  depression, 5-9 = Mild depression, 10-14 = Moderate depression, 15-19 = Moderately severe depression, 20-27 = Severe depression   Psychosocial Evaluation and Intervention:  Psychosocial Evaluation - 01/12/21 1025       Psychosocial Evaluation & Interventions   Interventions Encouraged to exercise with the program and follow exercise prescription    Comments Adolph has no barriers to attending the program. His support system his his family and friends. A t the moment he lives with his Dad. He is hoping to get healthier by attending the program. He also want s to lose some weight,especially some of the belly weight.  He is ready to start the program and should do well.    Expected Outcomes STG: Rolan attends all scheduled sessions and the education classes. LTG: Sayid is able to continue the progress he gained while in the program.    Continue Psychosocial Services  Follow up required by staff             Psychosocial Re-Evaluation:   Psychosocial Discharge (Final Psychosocial Re-Evaluation):   Education: Education Goals: Education classes will be provided on a weekly basis, covering required topics. Participant will state understanding/return demonstration of topics presented.  Learning Barriers/Preferences:  Learning Barriers/Preferences - 01/12/21 1014       Learning Barriers/Preferences   Learning Barriers None    Learning Preferences Skilled Demonstration             General Pulmonary Education Topics:  Infection Prevention: - Provides verbal and written material to individual with discussion of infection control including proper hand washing and proper equipment cleaning during exercise session. Flowsheet Row Pulmonary Rehab from 01/17/2021 in [facility] Cardiac and Pulmonary Rehab  Date 01/17/21  Educator [facility]  Instruction Review Code 1- Verbalizes Understanding       Falls Prevention: - Provides verbal and written material to individual with discussion of falls  prevention and safety. Flowsheet Row Pulmonary Rehab from 01/17/2021 in [facility] Cardiac and Pulmonary Rehab  Date 01/12/21  Educator [facility]  Instruction Review Code 1- Verbalizes Understanding       Chronic Lung Disease Review: - Group verbal instruction with posters, models, PowerPoint presentations and videos,  to review new updates, new respiratory medications, new advancements in procedures and treatments. Providing information on websites and "800" numbers for continued self-education. Includes information about supplement oxygen, available portable oxygen systems, continuous and intermittent flow rates, oxygen safety, concentrators, and Medicare reimbursement for oxygen. Explanation of Pulmonary Drugs, including class, frequency, complications, importance of spacers, rinsing mouth after steroid MDI's, and proper cleaning methods for nebulizers. Review of basic lung anatomy and physiology related to function, structure, and complications of lung disease. Review of risk factors. Discussion about methods for diagnosing sleep apnea and types  of masks and machines for OSA. Includes a review of the use of types of environmental controls: home humidity, furnaces, filters, dust mite/pet prevention, HEPA vacuums. Discussion about weather changes, air quality and the benefits of nasal washing. Instruction on Warning signs, infection symptoms, calling MD promptly, preventive modes, and value of vaccinations. Review of effective airway clearance, coughing and/or vibration techniques. Emphasizing that all should Create an Action Plan. Written material given at graduation.   AED/CPR: - Group verbal and written instruction with the use of models to demonstrate the basic use of the AED with the basic ABC's of resuscitation.    Anatomy and Cardiac Procedures: - Group verbal and visual presentation and models provide information about basic cardiac anatomy and function. Reviews the testing methods done to diagnose  heart disease and the outcomes of the test results. Describes the treatment choices: Medical Management, Angioplasty, or Coronary Bypass Surgery for treating various heart conditions including Myocardial Infarction, Angina, Valve Disease, and Cardiac Arrhythmias.  Written material given at graduation. Flowsheet Row Pulmonary Rehab from 01/17/2021 in [facility] Cardiac and Pulmonary Rehab  Education need identified 01/17/21       Medication Safety: - Group verbal and visual instruction to review commonly prescribed medications for heart and lung disease. Reviews the medication, class of the drug, and side effects. Includes the steps to properly store meds and maintain the prescription regimen.  Written material given at graduation.   Other: -Provides group and verbal instruction on various topics (see comments)   Knowledge Questionnaire Score:  Knowledge Questionnaire Score - 01/17/21 1433       Knowledge Questionnaire Score   Pre Score Cardiac 22/26 (Angina, nutrition, exericse, PAD) Pulmonary 15/18 (O2 safety)              Core Components/Risk Factors/Patient Goals at Admission:  Personal Goals and Risk Factors at Admission - 01/17/21 1435       Core Components/Risk Factors/Patient Goals on Admission    Weight Management Yes;Weight Loss;Obesity    Intervention Weight Management: Develop a combined nutrition and exercise program designed to reach desired caloric intake, while maintaining appropriate intake of nutrient and fiber, sodium and fats, and appropriate energy expenditure required for the weight goal.;Weight Management: Provide education and appropriate resources to help participant work on and attain dietary goals.;Weight Management/Obesity: Establish reasonable short term and long term weight goals.;Obesity: Provide education and appropriate resources to help participant work on and attain dietary goals.    Admit Weight 242 lb 3.2 oz (109.9 kg)    Goal Weight: Short Term 235 lb  (106.6 kg)    Goal Weight: Long Term 200 lb (90.7 kg)    Expected Outcomes Short Term: Continue to assess and modify interventions until short term weight is achieved;Long Term: Adherence to nutrition and physical activity/exercise program aimed toward attainment of established weight goal;Weight Loss: Understanding of general recommendations for a balanced deficit meal plan, which promotes 1-2 lb weight loss per week and includes a negative energy balance of [phone] kcal/d;Understanding recommendations for meals to include 15-35% energy as protein, 25-35% energy from fat, 35-60% energy from carbohydrates, less than 200mg  of dietary cholesterol, 20-35 gm of total fiber daily;Understanding of distribution of calorie intake throughout the day with the consumption of 4-5 meals/snacks    Diabetes Yes    Intervention Provide education about signs/symptoms and action to take for hypo/hyperglycemia.;Provide education about proper nutrition, including hydration, and aerobic/resistive exercise prescription along with prescribed medications to achieve blood glucose in normal ranges: Fasting glucose 65-99  mg/dL    Expected Outcomes Short Term: Participant verbalizes understanding of the signs/symptoms and immediate care of hyper/hypoglycemia, proper foot care and importance of medication, aerobic/resistive exercise and nutrition plan for blood glucose control.;Long Term: Attainment of HbA1C < 7%.    Heart Failure Yes    Intervention Provide a combined exercise and nutrition program that is supplemented with education, support and counseling about heart failure. Directed toward relieving symptoms such as shortness of breath, decreased exercise tolerance, and extremity edema.    Expected Outcomes Improve functional capacity of life;Short term: Attendance in program 2-3 days a week with increased exercise capacity. Reported lower sodium intake. Reported increased fruit and vegetable intake. Reports medication  compliance.;Short term: Daily weights obtained and reported for increase. Utilizing diuretic protocols set by physician.;Long term: Adoption of self-care skills and reduction of barriers for early signs and symptoms recognition and intervention leading to self-care maintenance.    Hypertension Yes    Intervention Provide education on lifestyle modifcations including regular physical activity/exercise, weight management, moderate sodium restriction and increased consumption of fresh fruit, vegetables, and low fat dairy, alcohol moderation, and smoking cessation.;Monitor prescription use compliance.    Expected Outcomes Short Term: Continued assessment and intervention until BP is < 140/60mm HG in hypertensive participants. < 130/43mm HG in hypertensive participants with diabetes, heart failure or chronic kidney disease.;Long Term: Maintenance of blood pressure at goal levels.    Lipids Yes    Intervention Provide education and support for participant on nutrition & aerobic/resistive exercise along with prescribed medications to achieve LDL 70mg , HDL >40mg .    Expected Outcomes Short Term: Participant states understanding of desired cholesterol values and is compliant with medications prescribed. Participant is following exercise prescription and nutrition guidelines.;Long Term: Cholesterol controlled with medications as prescribed, with individualized exercise RX and with personalized nutrition plan. Value goals: LDL < 70mg , HDL > 40 mg.             Education:Diabetes - Individual verbal and written instruction to review signs/symptoms of diabetes, desired ranges of glucose level fasting, after meals and with exercise. Acknowledge that pre and post exercise glucose checks will be done for 3 sessions at entry of program. Flowsheet Row Pulmonary Rehab from 01/17/2021 in [facility] Cardiac and Pulmonary Rehab  Date 01/17/21  Educator [facility]  Instruction Review Code 1- Verbalizes Understanding       Know  Your Numbers and Heart Failure: - Group verbal and visual instruction to discuss disease risk factors for cardiac and pulmonary disease and treatment options.  Reviews associated critical values for Overweight/Obesity, Hypertension, Cholesterol, and Diabetes.  Discusses basics of heart failure: signs/symptoms and treatments.  Introduces Heart Failure Zone chart for action plan for heart failure.  Written material given at graduation.   Core Components/Risk Factors/Patient Goals Review:    Core Components/Risk Factors/Patient Goals at Discharge (Final Review):    ITP Comments:  ITP Comments     Row Name 01/12/21 1024 01/17/21 1412 01/26/21 0851 01/30/21 0757 01/31/21 0716   ITP Comments Virtual orientation call completed today. he has an appointment on Date: 01/17/2021 for EP eval and gym Orientation.  Documentation of diagnosis can be found in [facility] Date: 01/17/2021. Completed 6MWT and gym orientation. Initial ITP created and sent for review to Dr. Emily Filbert, Medical Director. First full day of exercise!  Patient was oriented to gym and equipment including functions, settings, policies, and procedures.  Patient's individual exercise prescription and treatment plan were reviewed.  All starting workloads were established based on  the results of the 6 minute walk test done at initial orientation visit.  The plan for exercise progression was also introduced and progression will be customized based on patient's performance and goals. Pt was unable to follow exercise prescription today due to knee pain exacerbated with exercise. Began with 3/10 knee pain, increasing to 8/10. Pt advised to consult with physician concerning knee pain.  Was able to exercise 34 minutes. 30 Day review completed. Medical Director ITP review done, changes made as directed, and signed approval by Medical Director.            Comments:

## 2021-02-01 ENCOUNTER — Telehealth: Payer: Self-pay

## 2021-02-01 DIAGNOSIS — I5042 Chronic combined systolic (congestive) and diastolic (congestive) heart failure: Secondary | ICD-10-CM

## 2021-02-01 NOTE — Telephone Encounter (Signed)
Spoke with patient, any movement with his knee bothers him and he states he is unsure if he will be able to continue rehab. Recommended trying other machines that may work better. Encouraged patient to call doctor and make appointment to get knee checked out. Will place on 1 week hold for patient to find relief and call us again in 1 week to decide if he'd like to continue or discharge. Patient agrees.

## 2021-02-14 ENCOUNTER — Other Ambulatory Visit: Payer: Self-pay

## 2021-02-14 ENCOUNTER — Emergency Department: Payer: Medicaid Other

## 2021-02-14 DIAGNOSIS — I130 Hypertensive heart and chronic kidney disease with heart failure and stage 1 through stage 4 chronic kidney disease, or unspecified chronic kidney disease: Secondary | ICD-10-CM | POA: Diagnosis not present

## 2021-02-14 DIAGNOSIS — N1831 Chronic kidney disease, stage 3a: Secondary | ICD-10-CM | POA: Insufficient documentation

## 2021-02-14 DIAGNOSIS — Z7901 Long term (current) use of anticoagulants: Secondary | ICD-10-CM | POA: Diagnosis not present

## 2021-02-14 DIAGNOSIS — R0602 Shortness of breath: Secondary | ICD-10-CM | POA: Diagnosis not present

## 2021-02-14 DIAGNOSIS — Z794 Long term (current) use of insulin: Secondary | ICD-10-CM | POA: Diagnosis not present

## 2021-02-14 DIAGNOSIS — Z87891 Personal history of nicotine dependence: Secondary | ICD-10-CM | POA: Diagnosis not present

## 2021-02-14 DIAGNOSIS — E1122 Type 2 diabetes mellitus with diabetic chronic kidney disease: Secondary | ICD-10-CM | POA: Diagnosis not present

## 2021-02-14 DIAGNOSIS — Z79899 Other long term (current) drug therapy: Secondary | ICD-10-CM | POA: Insufficient documentation

## 2021-02-14 DIAGNOSIS — I5043 Acute on chronic combined systolic (congestive) and diastolic (congestive) heart failure: Secondary | ICD-10-CM | POA: Diagnosis not present

## 2021-02-14 DIAGNOSIS — I2510 Atherosclerotic heart disease of native coronary artery without angina pectoris: Secondary | ICD-10-CM | POA: Insufficient documentation

## 2021-02-14 DIAGNOSIS — J441 Chronic obstructive pulmonary disease with (acute) exacerbation: Secondary | ICD-10-CM | POA: Insufficient documentation

## 2021-02-14 DIAGNOSIS — R0781 Pleurodynia: Secondary | ICD-10-CM | POA: Diagnosis not present

## 2021-02-14 LAB — CBC WITH DIFFERENTIAL/PLATELET
Abs Immature Granulocytes: 0.03 10*3/uL (ref 0.00–0.07)
Basophils Absolute: 0.1 10*3/uL (ref 0.0–0.1)
Basophils Relative: 1 %
Eosinophils Absolute: 0 10*3/uL (ref 0.0–0.5)
Eosinophils Relative: 0 %
HCT: 42.2 % (ref 39.0–52.0)
Hemoglobin: 14.6 g/dL (ref 13.0–17.0)
Immature Granulocytes: 1 %
Lymphocytes Relative: 22 %
Lymphs Abs: 1.4 10*3/uL (ref 0.7–4.0)
MCH: 28.9 pg (ref 26.0–34.0)
MCHC: 34.6 g/dL (ref 30.0–36.0)
MCV: 83.6 fL (ref 80.0–100.0)
Monocytes Absolute: 0.5 10*3/uL (ref 0.1–1.0)
Monocytes Relative: 8 %
Neutro Abs: 4.5 10*3/uL (ref 1.7–7.7)
Neutrophils Relative %: 68 %
Platelets: 185 10*3/uL (ref 150–400)
RBC: 5.05 MIL/uL (ref 4.22–5.81)
RDW: 15.2 % (ref 11.5–15.5)
WBC: 6.5 10*3/uL (ref 4.0–10.5)
nRBC: 0 % (ref 0.0–0.2)

## 2021-02-14 LAB — BASIC METABOLIC PANEL
Anion gap: 10 (ref 5–15)
BUN: 21 mg/dL — ABNORMAL HIGH (ref 6–20)
CO2: 24 mmol/L (ref 22–32)
Calcium: 9 mg/dL (ref 8.9–10.3)
Chloride: 100 mmol/L (ref 98–111)
Creatinine, Ser: 1.22 mg/dL (ref 0.61–1.24)
GFR, Estimated: >60 mL/min (ref >60)
Glucose, Bld: 292 mg/dL — ABNORMAL HIGH (ref 70–99)
Potassium: 3.9 mmol/L (ref 3.5–5.1)
Sodium: 134 mmol/L — ABNORMAL LOW (ref 135–145)

## 2021-02-14 LAB — D-DIMER, QUANTITATIVE: D-Dimer, Quant: 0.83 ug/mL-FEU — ABNORMAL HIGH (ref 0.00–0.50)

## 2021-02-14 LAB — APTT: aPTT: 34 seconds (ref 24–36)

## 2021-02-14 LAB — HEPARIN LEVEL (UNFRACTIONATED): Heparin Unfractionated: >1.1 IU/mL — ABNORMAL HIGH (ref 0.30–0.70)

## 2021-02-14 LAB — PROTIME-INR
INR: 1.2 (ref 0.8–1.2)
Prothrombin Time: 15.4 seconds — ABNORMAL HIGH (ref 11.4–15.2)

## 2021-02-14 MED ORDER — IOHEXOL 350 MG/ML SOLN
100.0000 mL | Freq: Once | INTRAVENOUS | Status: AC | PRN
  Administered 2021-02-14: 100 mL via INTRAVENOUS

## 2021-02-14 MED ORDER — ALBUTEROL SULFATE HFA 108 (90 BASE) MCG/ACT IN AERS
2.0000 | INHALATION_SPRAY | RESPIRATORY_TRACT | Status: DC | PRN
  Filled 2021-02-14: qty 6.7

## 2021-02-14 NOTE — ED Triage Notes (Signed)
Pt states since Monday he has had sob and right sided rib pain, pt states he is unsure if maybe he broke a rib while working on a car, no injury to right side but ot states he ws leaning on that side on the side of the car. Pt states sob has gotten worse since. Pt has hx of blood clots, takes xarelto.

## 2021-02-15 ENCOUNTER — Emergency Department
Admission: EM | Admit: 2021-02-15 | Discharge: 2021-02-15 | Disposition: A | Discharge status: Home / Self Care | Payer: Medicaid Other | Attending: Emergency Medicine | Admitting: Emergency Medicine

## 2021-02-15 DIAGNOSIS — R0781 Pleurodynia: Secondary | ICD-10-CM

## 2021-02-15 DIAGNOSIS — R0600 Dyspnea, unspecified: Secondary | ICD-10-CM

## 2021-02-15 LAB — BRAIN NATRIURETIC PEPTIDE: B Natriuretic Peptide: 795.3 pg/mL — ABNORMAL HIGH (ref 0.0–100.0)

## 2021-02-15 MED ORDER — LIDOCAINE 5 % EX PTCH: 1.0000 | MEDICATED_PATCH | Freq: Two times a day (BID) | CUTANEOUS | 0 refills | Status: DC

## 2021-02-15 MED ORDER — MORPHINE SULFATE (PF) 4 MG/ML IV SOLN
4.0000 mg | Freq: Once | INTRAVENOUS | Status: AC
Start: 2021-02-15 — End: 2021-02-15
  Administered 2021-02-15: 4 mg via INTRAVENOUS
  Filled 2021-02-15: qty 1

## 2021-02-15 MED ORDER — OXYCODONE-ACETAMINOPHEN 5-325 MG PO TABS: 2.0000 | ORAL_TABLET | Freq: Four times a day (QID) | ORAL | 0 refills | Status: DC | PRN

## 2021-02-15 MED ORDER — OXYCODONE-ACETAMINOPHEN 5-325 MG PO TABS
2.0000 | ORAL_TABLET | Freq: Once | ORAL | Status: AC
Start: 2021-02-15 — End: 2021-02-15
  Administered 2021-02-15: 2 via ORAL
  Filled 2021-02-15: qty 2

## 2021-02-15 MED ORDER — KETOROLAC TROMETHAMINE 30 MG/ML IJ SOLN
15.0000 mg | Freq: Once | INTRAMUSCULAR | Status: AC
  Administered 2021-02-15: 15 mg via INTRAVENOUS
  Filled 2021-02-15: qty 1

## 2021-02-15 MED ORDER — LIDOCAINE 5 % EX PTCH
1.0000 | MEDICATED_PATCH | CUTANEOUS | Status: DC
  Administered 2021-02-15: 1 via TRANSDERMAL
  Filled 2021-02-15: qty 1

## 2021-02-15 NOTE — Discharge Instructions (Signed)
As we discussed, your work-up was generally reassuring tonight in spite of your pain and the shortness of breath it causes.  There was no evidence of a broken rib, a blood clot in your lungs, nor fluid in your lungs like you had in the past.  Most likely have an injury to the cartilage and it is because of your discomfort.  Please try using the prescriptions given tonight for pain relief.  You can use over-the-counter ibuprofen 600 mg 3 times a day with meals for prophylaxis for 5 days to see if it helps.  Use the prescribed Lidoderm patch as instructed. Take Percocet as prescribed for severe pain. Do not drink alcohol, drive or participate in any other potentially dangerous activities while taking this medication as it may make you sleepy. Do not take this medication with any other sedating medications, either prescription or over-the-counter. If you were prescribed Percocet or Vicodin, do not take these with acetaminophen (Tylenol) as it is already contained within these medications.   This medication is an opiate (or narcotic) pain medication and can be habit forming.  Use it as little as possible to achieve adequate pain control.  Do not use or use it with extreme caution if you have a history of opiate abuse or dependence.  If you are on a pain contract with your primary care doctor or a pain specialist, be sure to let them know you were prescribed this medication today from the [facility] Emergency Department.  This medication is intended for your use only - do not give any to anyone else and keep it in a secure place where nobody else, especially children, have access to it.  It will also cause or worsen constipation, so you may want to consider taking an over-the-counter stool softener while you are taking this medication.  We recommend you follow-up with your primary care provider.  He also mention wanting to follow-up with your cardiologist, [name], to discuss your ongoing heart disease  which, while it does not play a role tonight with your symptoms, it is reasonable to follow-up on to discuss long-term care.  Return immediately to the emergency department if you develop new or worsening symptoms that concern you.

## 2021-02-15 NOTE — ED Provider Notes (Signed)
[facility] Emergency Department Provider Note  ____________________________________________   Event Date/Time   First MD Initiated Contact with Patient 02/15/21 [phone]     (approximate)  I have reviewed the triage vital signs and the nursing notes.   HISTORY  Chief Complaint Shortness of Breath    HPI Gabriel [name]. is a 50 y.o. male with medical history as listed below who presents for evaluation of right-sided rib pain that has been gradually worsening over the last several days and has become severe and causing him to be short of breath.  He is on Xarelto for prior PE.  He reports that he was leaning up against a car and felt something pop in his ribs and the pain was acute in onset and sharp.  It has been gradually worsening since then and now he is taking small shallow breaths because it hurts to take deep breaths.  He is not having any central or left-sided chest pain.  He denies fever, sore throat, nausea, vomiting, and abdominal pain.  Moving around and pushing on the area makes it worse, nothing in particular makes it better.     Past Medical History:  Diagnosis Date   CAD (coronary artery disease)    a. 04/2015 low risk MV;  b. 12/2016 Cath: minor irregs in LAD/Diag/LCX/OM, RCA 40p/m/d; c. 05/2020 Cath: LM nl, LAD 50d, LCX 30p, OM1 40, RCA 100p w/ L->L collats. CO/CI 3.1/1.3-->Med rx.   Chest wall pain, chronic    Chronic Troponin Elevation    CKD (chronic kidney disease), stage II-III    COPD (chronic obstructive pulmonary disease) (HCC)    Diabetes mellitus without complication ([place])    HFrEF (heart failure with reduced ejection fraction) ([place])    Hypertension    Resolved since weight loss   Mixed Ischemic & NICM (nonischemic cardiomyopathy) ([place])    a. 03/2015 Echo: EF 45-50%; b. 12/2015 Echo: EF 20-25%; c. 02/2016 Echo: EF 30-35%; d. 11/2016 Echo: EF 40-45%; e. 06/2019 Echo: EF 30-35%; f. 11/2019 Echo: EF 25-30%; g. 05/2020 Echo: EF 35-40%,  glob HK, nl RV size/fxn, PASP 44.84mmHg, mod dil LA. Mild to mod MR.   Myocardial infarct [facility])    NSVT (nonsustained ventricular tachycardia) ([place])    a. 12/2015 noted on tele-->amio;  b. 12/2015 Event monitor: no VT. Atach noted.   Obesity (BMI 30.0-34.9)    Psoriasis    Syncope    a. 01/2016 - felt to be vasovagal.   Tobacco abuse     Patient Active Problem List   Diagnosis Date Noted   AKI (acute kidney injury) ([place]) 11/24/2020   Acute kidney injury ([place]) 11/23/2020   OSA on CPAP 11/23/2020   Chest pain 10/27/2020   Snoring 08/15/2020   Type II diabetes mellitus with renal manifestations ([place]) 06/11/2020   COPD (chronic obstructive pulmonary disease) ([place]) 06/11/2020   Leukocytosis 06/11/2020   PE (pulmonary thromboembolism) ([place]) 06/11/2020   Hyperglycemia    Elevated brain natriuretic peptide (BNP) level    Uncontrolled type 2 diabetes mellitus with hyperglycemia, with long-term current use of insulin ([place]) 05/13/2020   Acute on chronic HFrEF (heart failure with reduced ejection fraction) ([place]) 05/12/2020   Erectile dysfunction due to arterial insufficiency 03/15/2020   Screening PSA (prostate specific antigen) 03/15/2020   Erectile dysfunction 03/02/2020   Acute pulmonary embolism ([place]) 01/03/2020   Pulmonary embolism on right [facility])    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[place]) 12/31/2019   Hemoptysis    Costochondritis  Acute respiratory failure with hypoxia (HCC)    Acute on chronic combined systolic (congestive) and diastolic (congestive) heart failure ([place]) 12/20/2019   Acute on chronic heart failure ([place]) 11/22/2019   CKD (chronic kidney disease), stage IIIa 06/26/2019   Type 2 diabetes mellitus with hyperlipidemia ([place]) 06/26/2019   Hypertensive urgency 06/26/2019   Acute on chronic combined systolic and diastolic CHF (congestive heart failure) ([place]) 09/02/2017   Other chest pain 08/24/2017   Dizziness    Noncompliance with medications 04/29/2017   Back pain 03/06/2017   Tobacco  use 12/04/2016   Gallbladder sludge 12/04/2016   Acute on chronic systolic CHF (congestive heart failure) ([place]) 11/28/2016   Coronary artery disease, non-occlusive 03/15/2016   Atypical chest pain 02/16/2016   Orthostatic hypotension 01/23/2016   Hypomagnesemia 75/05/2584   Chronic systolic CHF (congestive heart failure) ([place]) 01/23/2016   NICM (nonischemic cardiomyopathy) ([place]) 01/17/2016   NSVT (nonsustained ventricular tachycardia) (HCC)    Troponin I above reference range    Hyperlipidemia 05/17/2015   COPD exacerbation ([place]) 05/17/2015   Essential hypertension 03/31/2015   Elevated troponin 03/20/2015    Past Surgical History:  Procedure Laterality Date   AMPUTATION     CARDIAC CATHETERIZATION     FINGER AMPUTATION     Traumatic   FINGER FRACTURE SURGERY Left    LEFT HEART CATH AND CORONARY ANGIOGRAPHY N/A 01/06/2017   Procedure: Left Heart Cath and Coronary Angiography;  Surgeon: Wellington Hampshire, MD;  Location: [facility];  Service: Cardiovascular;  Laterality: N/A;   RIGHT/LEFT HEART CATH AND CORONARY ANGIOGRAPHY N/A 06/13/2020   Procedure: RIGHT/LEFT HEART CATH AND CORONARY ANGIOGRAPHY;  Surgeon: Nelva Bush, MD;  Location: [place] [facility];  Service: Cardiovascular;  Laterality: N/A;    Prior to Admission medications   Medication Sig Start Date End Date Taking? Authorizing Provider  lidocaine (LIDODERM) 5 % Place 1 patch onto the skin every 12 (twelve) hours. Remove & Discard patch within 12 hours or as directed by MD 02/15/21 02/15/22 Yes [name], MD  oxyCODONE-acetaminophen (PERCOCET) 5-325 MG tablet Take 2 tablets by mouth every 6 (six) hours as needed for severe pain. 02/15/21  Yes [name], MD  carvedilol (COREG) 6.25 MG tablet Take 1 tablet (6.25 mg total) by mouth 2 (two) times daily with a meal. 08/02/20   Larey Dresser, MD  cetirizine (ZYRTEC) 10 MG tablet Take 10 mg by mouth daily.    [provider]  ENTRESTO 49-51 MG  Take 1 tablet by mouth at bedtime. 11/30/20   [provider]  fenofibrate (TRICOR) 145 MG tablet Take 1 tablet (145 mg total) by mouth daily. 06/09/20   Lorella Nimrod, MD  HUMALOG KWIKPEN 100 UNIT/ML KwikPen Inject 3 Units into the skin 3 (three) times daily. 11/27/20   [provider]  Insulin Glargine (BASAGLAR KWIKPEN) 100 UNIT/ML Inject 28 Units into the skin at bedtime. 06/09/20   Lorella Nimrod, MD  Ipratropium-Albuterol (COMBIVENT RESPIMAT) 20-100 MCG/ACT AERS respimat Inhale 1 puff into the lungs every 6 (six) hours as needed for wheezing.    [provider]  nitroGLYCERIN (NITROSTAT) 0.4 MG SL tablet Place 1 tablet (0.4 mg total) under the tongue every 5 (five) minutes x 3 doses as needed for chest pain. 06/09/20   Lorella Nimrod, MD  rivaroxaban (XARELTO) 20 MG TABS tablet Take 1 tablet (20 mg total) by mouth daily with supper. 11/07/20   Cammie Sickle, MD  rosuvastatin (CRESTOR) 20 MG tablet Take 1 tablet (  20 mg total) by mouth at bedtime. 06/09/20   Lorella Nimrod, MD  torsemide (DEMADEX) 20 MG tablet Take 1 tablet (20 mg total) by mouth daily. 11/27/20   Dessa Phi, DO  TRULICITY 2.68 TM/1.9QQ SOPN INJECT 0.75 MG INTO THE SKIN ONCE A WEEK. 09/28/20   Carles Collet M, PA-C    Allergies Metformin and related, Prednisone, and Zantac [ranitidine hcl]  Family History  Problem Relation Age of Onset   Diabetes Mellitus II Mother    Hypothyroidism Mother    Hypertension Mother    Hypertension Father    Gout Father    Cancer Paternal Aunt    Cancer Maternal Grandfather    Diabetes Maternal Grandfather     Social History Social History   Tobacco Use   Smoking status: Former    Packs/day: 0.50    Years: 33.00    Pack years: 16.50    Types: Cigarettes    Quit date: 05/08/2020    Years since quitting: 0.7   Smokeless tobacco: Former    Quit date: 05/08/2020   Tobacco comments:    Quit Sept 2021  Vaping Use   Vaping Use: Former  Substance Use  Topics   Alcohol use: Yes    Alcohol/week: 0.0 standard drinks    Comment: occassionally   Drug use: No    Review of Systems Constitutional: No fever/chills Eyes: No visual changes. ENT: No sore throat. Cardiovascular: Right-sided rib/chest pain. Respiratory: Shortness of breath associated with right-sided rib and chest pain. Gastrointestinal: No abdominal pain.  No nausea, no vomiting.  No diarrhea.  No constipation. Genitourinary: Negative for dysuria. Musculoskeletal: Negative for neck pain.  Negative for back pain. Integumentary: Negative for rash. Neurological: Negative for headaches, focal weakness or numbness.   ____________________________________________   PHYSICAL EXAM:  VITAL SIGNS: ED Triage Vitals  Enc Vitals Group     BP 02/14/21 2020 (!) 147/89     Pulse Rate 02/14/21 2020 97     Resp 02/14/21 2020 18     Temp 02/14/21 2020 99 F (37.2 C)     Temp src --      SpO2 02/14/21 2020 94 %     Weight 02/14/21 2017 112.5 kg (248 lb)     Height 02/14/21 2017 1.829 m (6')     Head Circumference --      Peak Flow --      Pain Score 02/14/21 2017 10     Pain Loc --      Pain Edu? --      Excl. in [place]? --     Constitutional: Alert and oriented.  Appears uncomfortable but is not in severe distress. Eyes: Conjunctivae are normal.  Head: Atraumatic. Nose: No congestion/rhinnorhea. Mouth/Throat: Patient is wearing a mask. Neck: No stridor.  No meningeal signs.   Cardiovascular: Normal rate, regular rhythm. Good peripheral circulation. Respiratory: The patient is splinting due to his discomfort but otherwise is not in respiratory distress.  He is able to slow down his breathing and take deeper breaths when asked to do so.  His lungs are clear to auscultation bilaterally and in all lung fields with no wheezes, rales, nor rhonchi. Gastrointestinal: Soft and nontender including in the epigastrium and right upper quadrant.  He has a bit of referred pain when I palpate in  the right upper quadrant due to the pain in his right ribs but he is not tender to palpation of his abdomen. Musculoskeletal: Highly reproducible right anterior and right lateral rib  tenderness to palpation.  No visible deformity nor palpable deformity, no ecchymosis. Neurologic:  Normal speech and language. No gross focal neurologic deficits are appreciated.  Skin:  Skin is warm, dry and intact except for some chronic skin changes on his feet in the distribution of the sandals he is wearing, but does not appear acute or infected. Psychiatric: Mood and affect are normal. Speech and behavior are normal.  ____________________________________________   LABS (all labs ordered are listed, but only abnormal results are displayed)  Labs Reviewed  BASIC METABOLIC PANEL - Abnormal; Notable for the following components:      Result Value   Sodium 134 (*)    Glucose, Bld 292 (*)    BUN 21 (*)    All other components within normal limits  PROTIME-INR - Abnormal; Notable for the following components:   Prothrombin Time 15.4 (*)    All other components within normal limits  D-DIMER, QUANTITATIVE - Abnormal; Notable for the following components:   D-Dimer, Quant 0.83 (*)    All other components within normal limits  HEPARIN LEVEL (UNFRACTIONATED) - Abnormal; Notable for the following components:   Heparin Unfractionated >1.10 (*)    All other components within normal limits  BRAIN NATRIURETIC PEPTIDE - Abnormal; Notable for the following components:   B Natriuretic Peptide 795.3 (*)    All other components within normal limits  CBC WITH DIFFERENTIAL/PLATELET  APTT   ____________________________________________  EKG  ED ECG REPORT I, [name], the attending physician, personally viewed and interpreted this ECG.  Date: 02/14/2021 EKG Time: 20: 21 Rate: 96 Rhythm: normal sinus rhythm QRS Axis: normal Intervals: Incomplete left bundle branch block ST/T Wave abnormalities: Non-specific  ST segment / T-wave changes, but no clear evidence of acute ischemia. Narrative Interpretation: no definitive evidence of acute ischemia; does not meet STEMI criteria.  ____________________________________________  RADIOLOGY I, [name], personally viewed and evaluated these images (plain radiographs) as part of my medical decision making, as well as reviewing the written report by the radiologist.  ED MD interpretation: No acute abnormalities identified on chest x-ray.  CTA of the chest is reassuring with no evidence of PE, interstitial/pulmonary edema, pneumonia, nor rib injury.  Official radiology report(s): DG Chest 2 View  Result Date: 02/14/2021 CLINICAL DATA:  Shortness of breath and right-sided rib pain. EXAM: CHEST - 2 VIEW COMPARISON:  None. FINDINGS: Mild, diffusely increased interstitial lung markings are seen. Mild, stable linear scarring is noted within the lateral aspect of the mid left lung with mild, stable atelectasis is seen within the bilateral lung bases. There is no evidence of a pleural effusion or pneumothorax. The heart size and mediastinal contours are within normal limits. No acute osseous abnormalities are identified. IMPRESSION: 1. Mild, stable areas of linear scarring and bibasilar atelectasis. 2. Mildly increased interstitial lung markings which may represent sequelae associated with mild interstitial edema. Electronically Signed   By: Virgina Norfolk M.D.   On: 02/14/2021 20:38   CT Angio Chest PE W and/or Wo Contrast  Result Date: 02/15/2021 CLINICAL DATA:  Dyspnea, right rib pain EXAM: CT ANGIOGRAPHY CHEST WITH CONTRAST TECHNIQUE: Multidetector CT imaging of the chest was performed using the standard protocol during bolus administration of intravenous contrast. Multiplanar CT image reconstructions and MIPs were obtained to evaluate the vascular anatomy. CONTRAST:  148mL OMNIPAQUE IOHEXOL 350 MG/ML SOLN COMPARISON:  06/07/2020 FINDINGS: Cardiovascular: There is  adequate opacification of the pulmonary arterial tree. No intraluminal filling defect identified to suggest acute pulmonary embolism. Previously  noted right lower lobar pulmonary arterial filling defects have resolved. Central pulmonary arteries are of normal caliber. Moderate multi-vessel coronary artery calcification. Global cardiac size is mildly enlarged, a slightly improved in overall caliber since prior examination. No pericardial effusion. Minimal atherosclerotic calcification within the thoracic aorta. No aortic aneurysm. Mediastinum/Nodes: Shotty mediastinal adenopathy is again identified and has improved in caliber since prior examination suggesting a reactive or inflammatory process on prior exam. No new pathologic thoracic adenopathy identified. Visualized thyroid is unremarkable. Esophagus is unremarkable. Lungs/Pleura: Previously noted pleural effusions and interstitial pulmonary edema has resolved. The lungs are clear. No pneumothorax or pleural effusion. Central airways are widely patent. Upper Abdomen: No acute abnormality. Musculoskeletal: Osseous structures are age-appropriate. No acute bone abnormality. Specifically, no left rib fracture identified. Review of the MIP images confirms the above findings. IMPRESSION: No pulmonary embolism. Moderate multi-vessel coronary artery calcification. Mild cardiomegaly, improved since prior examination. Additionally, findings of a cardiogenic failure on prior examination have resolved. Improved mediastinal adenopathy, likely reactive on prior examination. Aortic Atherosclerosis (ICD10-I70.0). Electronically Signed   By: Fidela Salisbury MD   On: 02/15/2021 00:05    ____________________________________________   PROCEDURES   Procedure(s) performed (including Critical Care):  Procedures   ____________________________________________   INITIAL IMPRESSION / MDM / [place] / ED COURSE  As part of my medical decision making, I reviewed  the following data within the [facility] notes reviewed and incorporated, Labs reviewed , EKG interpreted , Old chart reviewed, Radiograph reviewed , Notes from prior ED visits, and [place] Controlled Substance Database   Differential diagnosis includes, but is not limited to, rib contusion, cartilage injury, rib fracture, pulmonary contusion, ACS, PE, heart failure exacerbation, pneumonia.  Patient's vital signs are stable and within normal limits.  No infectious signs or symptoms.  EKG is reassuring with no evidence of ischemia.  Basic metabolic panel is reassuring with no acute abnormalities other than a very slightly decreased sodium just below the lower limit of normal.  CBC is normal.  D-dimer was slightly elevated so he got a CTA chest further mention below.  I personally reviewed the patient's imaging and agree with the radiologist's interpretation that there are no acute abnormalities but some chronic changes on the chest x-ray.  BNP is elevated slightly but is of unclear clinical significance given the otherwise reassuring physical exam and imaging findings as discussed below.  The CTA chest actually is quite reassuring, with resolution of his arterial filling defects suggesting that his Xarelto is working on his prior PE.  He has no evidence of interstitial edema, no pleural effusions, no evidence of pneumonia, and no evidence of rib fracture.  The patient's physical exam is also reassuring to rule out emergent medical condition.  He has highly reproducible right-sided anterior and lateral chest wall tenderness to palpation suggestive of a soft tissue/cartilage injury given no evidence of rib fracture on CT.  He is splinting but his lungs are clear.  This seems to be an issue of pain control.  Biliary colic could be possible except that, on exam, he has no tenderness to palpation of his epigastrium nor right upper quadrant and the pain is very clearly coming from his ribs.  He  has had no nausea nor vomiting either.  The patient is comfortable with the plan for pain control and outpatient follow-up.  He has both a primary care doctor and a cardiologist with whom he can follow-up and I gave strict return precautions to the  ED if he is getting worse.  In the ED he is getting morphine 4 mg IV, a Lidoderm patch, and 2 Percocet which hopefully will last him longer than the morphine dose.  He is a big man and the morphine dose of 4 mg is well below the generally excepted dosing regimen of morphine 0.1 mg/kg, and I do not anticipate he will have any issues with the oral plus IV medication.  Additionally I am giving Toradol 15 mg IV which I think will be helpful.  I wrote prescriptions as listed below including Lidoderm patches and Percocet and encouraged him to use over-the-counter ibuprofen with meals.  I again reiterated the importance of outpatient follow-up and return to the ED with new symptoms and he understands and agrees.          ____________________________________________  FINAL CLINICAL IMPRESSION(S) / ED DIAGNOSES  Final diagnoses:  Rib pain on right side  Dyspnea, unspecified type     MEDICATIONS GIVEN DURING THIS VISIT:  Medications  albuterol (VENTOLIN HFA) 108 (90 Base) MCG/ACT inhaler 2 puff (has no administration in time range)  lidocaine (LIDODERM) 5 % 1 patch (1 patch Transdermal Patch Applied 02/15/21 0057)  iohexol (OMNIPAQUE) 350 MG/ML injection 100 mL (100 mLs Intravenous Contrast Given 02/14/21 2346)  oxyCODONE-acetaminophen (PERCOCET/ROXICET) 5-325 MG per tablet 2 tablet (2 tablets Oral Given 02/15/21 0053)  morphine 4 MG/ML injection 4 mg (4 mg Intravenous Given 02/15/21 0054)  ketorolac (TORADOL) 30 MG/ML injection 15 mg (15 mg Intravenous Given 02/15/21 0055)     ED Discharge Orders          Ordered    lidocaine (LIDODERM) 5 %  Every 12 hours        02/15/21 0101    oxyCODONE-acetaminophen (PERCOCET) 5-325 MG tablet  Every 6 hours PRN         02/15/21 0101             Note:  This document was prepared using Dragon voice recognition software and may include unintentional dictation errors.   [name], MD 02/15/21 [phone]

## 2021-02-16 ENCOUNTER — Encounter: Payer: Medicaid Other | Attending: Cardiovascular Disease

## 2021-02-16 DIAGNOSIS — I5040 Unspecified combined systolic (congestive) and diastolic (congestive) heart failure: Secondary | ICD-10-CM | POA: Insufficient documentation

## 2021-02-16 DIAGNOSIS — I5042 Chronic combined systolic (congestive) and diastolic (congestive) heart failure: Secondary | ICD-10-CM | POA: Insufficient documentation

## 2021-02-19 ENCOUNTER — Emergency Department: Payer: Medicaid Other

## 2021-02-19 ENCOUNTER — Encounter: Payer: Self-pay | Admitting: Emergency Medicine

## 2021-02-19 ENCOUNTER — Other Ambulatory Visit: Payer: Self-pay

## 2021-02-19 ENCOUNTER — Emergency Department
Admission: EM | Admit: 2021-02-19 | Discharge: 2021-02-19 | Disposition: A | Discharge status: Home / Self Care | Payer: Medicaid Other | Attending: Emergency Medicine | Admitting: Emergency Medicine

## 2021-02-19 DIAGNOSIS — R0781 Pleurodynia: Secondary | ICD-10-CM | POA: Diagnosis not present

## 2021-02-19 DIAGNOSIS — E1165 Type 2 diabetes mellitus with hyperglycemia: Secondary | ICD-10-CM | POA: Diagnosis not present

## 2021-02-19 DIAGNOSIS — I2510 Atherosclerotic heart disease of native coronary artery without angina pectoris: Secondary | ICD-10-CM | POA: Insufficient documentation

## 2021-02-19 DIAGNOSIS — I5033 Acute on chronic diastolic (congestive) heart failure: Secondary | ICD-10-CM | POA: Insufficient documentation

## 2021-02-19 DIAGNOSIS — Z87891 Personal history of nicotine dependence: Secondary | ICD-10-CM | POA: Diagnosis not present

## 2021-02-19 DIAGNOSIS — Z79899 Other long term (current) drug therapy: Secondary | ICD-10-CM | POA: Diagnosis not present

## 2021-02-19 DIAGNOSIS — E1122 Type 2 diabetes mellitus with diabetic chronic kidney disease: Secondary | ICD-10-CM | POA: Insufficient documentation

## 2021-02-19 DIAGNOSIS — I130 Hypertensive heart and chronic kidney disease with heart failure and stage 1 through stage 4 chronic kidney disease, or unspecified chronic kidney disease: Secondary | ICD-10-CM | POA: Insufficient documentation

## 2021-02-19 DIAGNOSIS — Z7901 Long term (current) use of anticoagulants: Secondary | ICD-10-CM | POA: Diagnosis not present

## 2021-02-19 DIAGNOSIS — Z794 Long term (current) use of insulin: Secondary | ICD-10-CM | POA: Diagnosis not present

## 2021-02-19 DIAGNOSIS — J449 Chronic obstructive pulmonary disease, unspecified: Secondary | ICD-10-CM | POA: Insufficient documentation

## 2021-02-19 DIAGNOSIS — N183 Chronic kidney disease, stage 3 unspecified: Secondary | ICD-10-CM | POA: Diagnosis not present

## 2021-02-19 DIAGNOSIS — R1011 Right upper quadrant pain: Secondary | ICD-10-CM | POA: Diagnosis not present

## 2021-02-19 LAB — CBC
HCT: 39.2 % (ref 39.0–52.0)
Hemoglobin: 13.2 g/dL (ref 13.0–17.0)
MCH: 28.4 pg (ref 26.0–34.0)
MCHC: 33.7 g/dL (ref 30.0–36.0)
MCV: 84.3 fL (ref 80.0–100.0)
Platelets: 152 10*3/uL (ref 150–400)
RBC: 4.65 MIL/uL (ref 4.22–5.81)
RDW: 15.5 % (ref 11.5–15.5)
WBC: 4.3 10*3/uL (ref 4.0–10.5)
nRBC: 0 % (ref 0.0–0.2)

## 2021-02-19 LAB — COMPREHENSIVE METABOLIC PANEL
ALT: 25 U/L (ref 0–44)
AST: 26 U/L (ref 15–41)
Albumin: 3.7 g/dL (ref 3.5–5.0)
Alkaline Phosphatase: 58 U/L (ref 38–126)
Anion gap: 13 (ref 5–15)
BUN: 15 mg/dL (ref 6–20)
CO2: 20 mmol/L — ABNORMAL LOW (ref 22–32)
Calcium: 8.7 mg/dL — ABNORMAL LOW (ref 8.9–10.3)
Chloride: 102 mmol/L (ref 98–111)
Creatinine, Ser: 1.07 mg/dL (ref 0.61–1.24)
GFR, Estimated: >60 mL/min (ref >60)
Glucose, Bld: 355 mg/dL — ABNORMAL HIGH (ref 70–99)
Potassium: 4 mmol/L (ref 3.5–5.1)
Sodium: 135 mmol/L (ref 135–145)
Total Bilirubin: 1.9 mg/dL — ABNORMAL HIGH (ref 0.3–1.2)
Total Protein: 7.4 g/dL (ref 6.5–8.1)

## 2021-02-19 LAB — URINALYSIS, COMPLETE (UACMP) WITH MICROSCOPIC
Bacteria, UA: NONE SEEN
Bilirubin Urine: NEGATIVE
Glucose, UA: >=500 mg/dL — AB
Ketones, ur: NEGATIVE mg/dL
Leukocytes,Ua: NEGATIVE
Nitrite: NEGATIVE
Protein, ur: 30 mg/dL — AB
Specific Gravity, Urine: 1.009 (ref 1.005–1.030)
Squamous Epithelial / HPF: NONE SEEN (ref 0–5)
pH: 5 (ref 5.0–8.0)

## 2021-02-19 LAB — LIPASE, BLOOD: Lipase: 40 U/L (ref 11–51)

## 2021-02-19 LAB — TROPONIN I (HIGH SENSITIVITY)
Troponin I (High Sensitivity): 48 ng/L — ABNORMAL HIGH (ref <18)
Troponin I (High Sensitivity): 41 ng/L — ABNORMAL HIGH (ref <18)

## 2021-02-19 MED ORDER — HYDROMORPHONE HCL 1 MG/ML IJ SOLN
0.5000 mg | Freq: Once | INTRAMUSCULAR | Status: AC
  Administered 2021-02-19: 0.5 mg via INTRAVENOUS
  Filled 2021-02-19: qty 1

## 2021-02-19 MED ORDER — ONDANSETRON HCL 4 MG/2ML IJ SOLN
4.0000 mg | Freq: Once | INTRAMUSCULAR | Status: AC
  Administered 2021-02-19: 4 mg via INTRAVENOUS
  Filled 2021-02-19: qty 2

## 2021-02-19 MED ORDER — OXYCODONE HCL 5 MG PO TABS: 5.0000 mg | ORAL_TABLET | Freq: Four times a day (QID) | ORAL | 0 refills | Status: AC | PRN

## 2021-02-19 NOTE — ED Notes (Signed)
Dr. Funke at bedside. 

## 2021-02-19 NOTE — Discharge Instructions (Addendum)
Take a maximum of 1 g of Tylenol every 6-8 hours.  Take the oxycodone for breakthrough pain.  Continue to use the pain patches.  Make an appointment with your primary care doctor for follow-up.  Return to the ER if your symptoms are worsening  Take oxycodone as prescribed. Do not drink alcohol, drive or participate in any other potentially dangerous activities while taking this medication as it may make you sleepy. Do not take this medication with any other sedating medications, either prescription or over-the-counter. If you were prescribed Percocet or Vicodin, do not take these with acetaminophen (Tylenol) as it is already contained within these medications.  This medication is an opiate (or narcotic) pain medication and can be habit forming. Use it as little as possible to achieve adequate pain control. Do not use or use it with extreme caution if you have a history of opiate abuse or dependence. If you are on a pain contract with your primary care doctor or a pain specialist, be sure to let them know you were prescribed this medication today from the Emergency Department. This medication is intended for your use only - do not give any to anyone else and keep it in a secure place where nobody else, especially children, have access to it.

## 2021-02-19 NOTE — ED Provider Notes (Signed)
[facility] Emergency Department Provider Note  ____________________________________________   Event Date/Time   First MD Initiated Contact with Patient 02/19/21 [phone]     (approximate)  I have reviewed the triage vital signs and the nursing notes.   HISTORY  Chief Complaint Abdominal Pain    HPI Gabriel Neuman. is a 50 y.o. male with coronary disease, CKD, COPD, diabetes, nonischemic cardiomyopathy who comes in with abdominal pain.  Patient is already on Xarelto for prior PE.  On review of records patient was seen on 6/30 for rib pain.  He had a work-up including CT angio that was negative.  Patient had a popping sensation did initially have pain but then developed pain over the next few days.  Patient was discharged with oxycodone.  He states that he is finished this.  He states that the oxycodone helps the pain but he ran out of the.  He stated that he tried to take Tylenol at home and was not able to control the pain without the pain is mostly in his right rib area constant, worse with certain movements, pressing on it, slightly better with the oxycodone.  Denies any fevers.          Past Medical History:  Diagnosis Date   CAD (coronary artery disease)    a. 04/2015 low risk MV;  b. 12/2016 Cath: minor irregs in LAD/Diag/LCX/OM, RCA 40p/m/d; c. 05/2020 Cath: LM nl, LAD 50d, LCX 30p, OM1 40, RCA 100p w/ L->L collats. CO/CI 3.1/1.3-->Med rx.   Chest wall pain, chronic    Chronic Troponin Elevation    CKD (chronic kidney disease), stage II-III    COPD (chronic obstructive pulmonary disease) (HCC)    Diabetes mellitus without complication ([place])    HFrEF (heart failure with reduced ejection fraction) ([place])    Hypertension    Resolved since weight loss   Mixed Ischemic & NICM (nonischemic cardiomyopathy) ([place])    a. 03/2015 Echo: EF 45-50%; b. 12/2015 Echo: EF 20-25%; c. 02/2016 Echo: EF 30-35%; d. 11/2016 Echo: EF 40-45%; e. 06/2019 Echo: EF 30-35%; f.  11/2019 Echo: EF 25-30%; g. 05/2020 Echo: EF 35-40%, glob HK, nl RV size/fxn, PASP 44.65mmHg, mod dil LA. Mild to mod MR.   Myocardial infarct [facility])    NSVT (nonsustained ventricular tachycardia) ([place])    a. 12/2015 noted on tele-->amio;  b. 12/2015 Event monitor: no VT. Atach noted.   Obesity (BMI 30.0-34.9)    Psoriasis    Syncope    a. 01/2016 - felt to be vasovagal.   Tobacco abuse     Patient Active Problem List   Diagnosis Date Noted   AKI (acute kidney injury) ([place]) 11/24/2020   Acute kidney injury ([place]) 11/23/2020   OSA on CPAP 11/23/2020   Chest pain 10/27/2020   Snoring 08/15/2020   Type II diabetes mellitus with renal manifestations ([place]) 06/11/2020   COPD (chronic obstructive pulmonary disease) ([place]) 06/11/2020   Leukocytosis 06/11/2020   PE (pulmonary thromboembolism) ([place]) 06/11/2020   Hyperglycemia    Elevated brain natriuretic peptide (BNP) level    Uncontrolled type 2 diabetes mellitus with hyperglycemia, with long-term current use of insulin ([place]) 05/13/2020   Acute on chronic HFrEF (heart failure with reduced ejection fraction) ([place]) 05/12/2020   Erectile dysfunction due to arterial insufficiency 03/15/2020   Screening PSA (prostate specific antigen) 03/15/2020   Erectile dysfunction 03/02/2020   Acute pulmonary embolism ([place]) 01/03/2020   Pulmonary embolism on right [facility])    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  [place]) 12/31/2019   Hemoptysis    Costochondritis    Acute respiratory failure with hypoxia (HCC)    Acute on chronic combined systolic (congestive) and diastolic (congestive) heart failure ([place]) 12/20/2019   Acute on chronic heart failure ([place]) 11/22/2019   CKD (chronic kidney disease), stage IIIa 06/26/2019   Type 2 diabetes mellitus with hyperlipidemia ([place]) 06/26/2019   Hypertensive urgency 06/26/2019   Acute on chronic combined systolic and diastolic CHF (congestive heart failure) ([place]) 09/02/2017   Other chest pain 08/24/2017   Dizziness    Noncompliance with  medications 04/29/2017   Back pain 03/06/2017   Tobacco use 12/04/2016   Gallbladder sludge 12/04/2016   Acute on chronic systolic CHF (congestive heart failure) ([place]) 11/28/2016   Coronary artery disease, non-occlusive 03/15/2016   Atypical chest pain 02/16/2016   Orthostatic hypotension 01/23/2016   Hypomagnesemia 22/63/3354   Chronic systolic CHF (congestive heart failure) ([place]) 01/23/2016   NICM (nonischemic cardiomyopathy) ([place]) 01/17/2016   NSVT (nonsustained ventricular tachycardia) (HCC)    Troponin I above reference range    Hyperlipidemia 05/17/2015   COPD exacerbation ([place]) 05/17/2015   Essential hypertension 03/31/2015   Elevated troponin 03/20/2015    Past Surgical History:  Procedure Laterality Date   AMPUTATION     CARDIAC CATHETERIZATION     FINGER AMPUTATION     Traumatic   FINGER FRACTURE SURGERY Left    LEFT HEART CATH AND CORONARY ANGIOGRAPHY N/A 01/06/2017   Procedure: Left Heart Cath and Coronary Angiography;  Surgeon: Wellington Hampshire, MD;  Location: [place] CV LAB;  Service: Cardiovascular;  Laterality: N/A;   RIGHT/LEFT HEART CATH AND CORONARY ANGIOGRAPHY N/A 06/13/2020   Procedure: RIGHT/LEFT HEART CATH AND CORONARY ANGIOGRAPHY;  Surgeon: Nelva [place], MD;  Location: [place] CV LAB;  Service: Cardiovascular;  Laterality: N/A;    Prior to Admission medications   Medication Sig Start Date End Date Taking? Authorizing Provider  carvedilol (COREG) 6.25 MG tablet Take 1 tablet (6.25 mg total) by mouth 2 (two) times daily with a meal. 08/02/20   Larey Dresser, MD  cetirizine (ZYRTEC) 10 MG tablet Take 10 mg by mouth daily.    [provider]  ENTRESTO 49-51 MG Take 1 tablet by mouth at bedtime. 11/30/20   [provider]  fenofibrate (TRICOR) 145 MG tablet Take 1 tablet (145 mg total) by mouth daily. 06/09/20   Lorella Nimrod, MD  HUMALOG KWIKPEN 100 UNIT/ML KwikPen Inject 3 Units into the skin 3 (three) times daily. 11/27/20    [provider]  Insulin Glargine (BASAGLAR KWIKPEN) 100 UNIT/ML Inject 28 Units into the skin at bedtime. 06/09/20   Lorella Nimrod, MD  Ipratropium-Albuterol (COMBIVENT RESPIMAT) 20-100 MCG/ACT AERS respimat Inhale 1 puff into the lungs every 6 (six) hours as needed for wheezing.    [provider]  lidocaine (LIDODERM) 5 % Place 1 patch onto the skin every 12 (twelve) hours. Remove & Discard patch within 12 hours or as directed by MD 02/15/21 02/15/22  [name], MD  nitroGLYCERIN (NITROSTAT) 0.4 MG SL tablet Place 1 tablet (0.4 mg total) under the tongue every 5 (five) minutes x 3 doses as needed for chest pain. 06/09/20   Lorella Nimrod, MD  oxyCODONE-acetaminophen (PERCOCET) 5-325 MG tablet Take 2 tablets by mouth every 6 (six) hours as needed for severe pain. 02/15/21   [name], MD  rivaroxaban (XARELTO) 20 MG TABS tablet Take 1 tablet (20 mg total) by mouth daily with supper. 11/07/20   Rogue Bussing,  Elisha Headland, MD  rosuvastatin (CRESTOR) 20 MG tablet Take 1 tablet (20 mg total) by mouth at bedtime. 06/09/20   Lorella Nimrod, MD  torsemide (DEMADEX) 20 MG tablet Take 1 tablet (20 mg total) by mouth daily. 11/27/20   Dessa Phi, DO  TRULICITY 7.78 EU/2.3NT SOPN INJECT 0.75 MG INTO THE SKIN ONCE A WEEK. 09/28/20   Carles Collet M, PA-C    Allergies Metformin and related, Prednisone, and Zantac [ranitidine hcl]  Family History  Problem Relation Age of Onset   Diabetes Mellitus II Mother    Hypothyroidism Mother    Hypertension Mother    Hypertension Father    Gout Father    Cancer Paternal Aunt    Cancer Maternal Grandfather    Diabetes Maternal Grandfather     Social History Social History   Tobacco Use   Smoking status: Former    Packs/day: 0.50    Years: 33.00    Pack years: 16.50    Types: Cigarettes    Quit date: 05/08/2020    Years since quitting: 0.7   Smokeless tobacco: Former    Quit date: 05/08/2020   Tobacco comments:    Quit Sept 2021   Vaping Use   Vaping Use: Former  Substance Use Topics   Alcohol use: Yes    Alcohol/week: 0.0 standard drinks    Comment: occassionally   Drug use: No      Review of Systems Constitutional: No fever/chills Eyes: No visual changes. ENT: No sore throat. Cardiovascular: Denies chest pain.  Lower right chest wall tenderness Respiratory: Denies shortness of breath. Gastrointestinal: Positive abdominal pain no nausea, no vomiting.  No diarrhea.  No constipation. Genitourinary: Negative for dysuria. Musculoskeletal: Negative for back pain. Skin: Negative for rash. Neurological: Negative for headaches, focal weakness or numbness. All other ROS negative ____________________________________________   PHYSICAL EXAM:  VITAL SIGNS: ED Triage Vitals  Enc Vitals Group     BP 02/19/21 0824 128/75     Pulse Rate 02/19/21 0824 88     Resp 02/19/21 0824 (!) 24     Temp 02/19/21 0824 (!) 97.5 F (36.4 C)     Temp Source 02/19/21 0824 Oral     SpO2 02/19/21 0824 97 %     Weight 02/19/21 0833 247 lb (112 kg)     Height 02/19/21 0833 6' (1.829 m)     Head Circumference --      Peak Flow --      Pain Score 02/19/21 0833 10     Pain Loc --      Pain Edu? --      Excl. in [place]? --     Constitutional: Alert and oriented. Well appearing and in no acute distress. Eyes: Conjunctivae are normal. EOMI. Head: Atraumatic. Nose: No congestion/rhinnorhea. Mouth/Throat: Mucous membranes are moist.   Neck: No stridor. Trachea Midline. FROM Cardiovascular: Normal rate, regular rhythm. Grossly normal heart sounds.  Good peripheral circulation.  Point tenderness on the rib on the right lower side Respiratory: Normal respiratory effort.  No retractions. Lungs CTAB. Gastrointestinal: Tender in the right upper quadrant.  No distention. No abdominal bruits.  Musculoskeletal: No lower extremity tenderness nor edema.  No joint effusions. Neurologic:  Normal speech and language. No gross focal neurologic  deficits are appreciated.  Skin:  Skin is warm, dry and intact. No rash noted. Psychiatric: Mood and affect are normal. Speech and behavior are normal. GU: Deferred   ____________________________________________   LABS (all labs ordered are listed, but  only abnormal results are displayed)  Labs Reviewed  COMPREHENSIVE METABOLIC PANEL - Abnormal; Notable for the following components:      Result Value   CO2 20 (*)    Glucose, Bld 355 (*)    Calcium 8.7 (*)    Total Bilirubin 1.9 (*)    All other components within normal limits  URINALYSIS, COMPLETE (UACMP) WITH MICROSCOPIC - Abnormal; Notable for the following components:   Color, Urine STRAW (*)    APPearance CLEAR (*)    Glucose, UA >=500 (*)    Hgb urine dipstick SMALL (*)    Protein, ur 30 (*)    All other components within normal limits  TROPONIN I (HIGH SENSITIVITY) - Abnormal; Notable for the following components:   Troponin I (High Sensitivity) 48 (*)    All other components within normal limits  LIPASE, BLOOD  CBC   ____________________________________________   ED ECG REPORT I, Vanessa [place], the attending physician, personally viewed and interpreted this ECG.  Normal sinus rate of 86, no ST elevation, no T wave versions, occasional PVC ____________________________________________  RADIOLOGY Robert Bellow, personally viewed and evaluated these images (plain radiographs) as part of my medical decision making, as well as reviewing the written report by the radiologist.  ED MD interpretation: No pneumonia  Official radiology report(s): DG Chest 2 View  Result Date: 02/19/2021 CLINICAL DATA:  Shortness of breath. EXAM: CHEST - 2 VIEW COMPARISON:  Comparison 02/14/2021 FINDINGS: Heart size is mildly prominent but unchanged. Stable linear density in the left mid lung could represent scar or atelectasis. Coarse lung markings are unchanged. No overt pulmonary edema. No focal airspace disease. No significant pleural  fluid. No acute bone abnormality. IMPRESSION: No active cardiopulmonary disease. Electronically Signed   By: Markus Daft M.D.   On: 02/19/2021 09:01    ____________________________________________   PROCEDURES  Procedure(s) performed (including Critical Care):  .1-3 Lead EKG Interpretation  Date/Time: 02/19/2021 12:30 PM Performed by: Vanessa [place], MD Authorized by: Vanessa [place]oz[place], MD     Interpretation: normal     ECG rate:  70s   ECG rate assessment: normal     Rhythm: sinus rhythm     Ectopy: none     Conduction: normal     ____________________________________________   INITIAL IMPRESSION / ASSESSMENT AND PLAN / ED COURSE  Gabriel Menna. was evaluated in Emergency Department on 02/19/2021 for the symptoms described in the history of present illness. He was evaluated in the context of the global COVID-19 pandemic, which necessitated consideration that the patient might be at risk for infection with the SARS-CoV-2 virus that causes COVID-19. Institutional protocols and algorithms that pertain to the evaluation of patients at risk for COVID-19 are in a state of rapid change based on information released by regulatory bodies including the CDC and federal and state organizations. These policies and algorithms were followed during the patient's care in the ED.    Patient is a 50 year old who comes in with continued right chest wall, upper abdominal pain.  Patient is point tender on the right rib but does have some right upper quadrant concerning for the potential gallbladder pathology.  Patient already had CT PE without evidence of pulmonary embolism and had repeat chest x-ray today that does not show any evidence of forming pneumonia.  Patient seems to be splinting from the pain.  Cardiac markers and EKG were ordered to evaluate for ACS.  We will keep patient the cardiac monitor to  evaluate for arrhythmia.  We will give 1 dose of IV Dilaudid IV Zofran and start off with ultrasound to  evaluate the gallbladder  Patient reports that the pain was relieved after IV medications however after having the ultrasound and pushing on the area he states that the pain came back.  His sugar is elevated but no evidence of DKA.  Is liver function test is slightly elevated but LFTs are normal.  Reports he last used Tylenol yesterday 4 g.  Explained to patient that the max amount per day is 4 g.  No evidence of anemia.  Cardiac markers are slightly elevated but similar to prior and they are downtrending today.  Ultrasound was reassuring just showed some hepatic steatosis.  Discussed with patient his reassuring work-up.  We discussed CT imaging but patient states that his pain is not really in his abdomen and is point tender on his lower right rib.  I do not feel like repeat CT PE is necessary given he denies any shortness of breath and states that this is exactly how he felt a few days ago.  Patient is also on a blood thinner.  Patient is being treated for possible cartilage rupture.  Patient states that the pain medicine seem to help but he was not able to fill the Lidoderm patches due to insurance not covering them.  He has been taking over-the-counter lidocaine patches as well.  At this time patient is comfortable with being discharged home we will give him another 5-day course of antibiotics in case this is just taking more time to heal however I did express to patient that he needs to follow-up with a primary care doctor for additional pain meds.       ____________________________________________   FINAL CLINICAL IMPRESSION(S) / ED DIAGNOSES   Final diagnoses:  RUQ pain  Rib pain      MEDICATIONS GIVEN DURING THIS VISIT:  Medications  HYDROmorphone (DILAUDID) injection 0.5 mg (0.5 mg Intravenous Given 02/19/21 1000)  ondansetron (ZOFRAN) injection 4 mg (4 mg Intravenous Given 02/19/21 0959)     ED Discharge Orders          Ordered    oxyCODONE (ROXICODONE) 5 MG immediate  release tablet  Every 6 hours PRN        02/19/21 1235             Note:  This document was prepared using Dragon voice recognition software and may include unintentional dictation errors.    Vanessa [place], MD 02/19/21 1236

## 2021-02-19 NOTE — ED Triage Notes (Signed)
Pt via POV from home. Pt c/o RUQ pain since last Monday was seen and d/c on 6/30. Pt states they did an X-Ray and CT and dx with a torn cartilage on one of his ribs. Pt was d/c with Percocet. Pt states that the SOB and pain has gotten worse. Pt is A&Ox4 and NAD but seems to be uncomfortable.

## 2021-02-19 NOTE — ED Notes (Signed)
Pt to ED c/o sharp pain under R rib area that started 7d ago. Was seen last week in hospital for same. Hurts to breathe deeply or cough. Pt appears slightly SOB. SPO2 96% on RA. Denies smoking, has DM. Lungs clear.

## 2021-02-20 ENCOUNTER — Telehealth: Payer: Self-pay | Admitting: Cardiovascular Disease

## 2021-02-20 NOTE — Telephone Encounter (Signed)
Attempted to schedule.  LMOV to call office.  ° °

## 2021-02-26 ENCOUNTER — Telehealth: Payer: Self-pay

## 2021-02-26 IMAGING — CT CT HEAD W/O CM
3 series · 16 of 47 positions shown, 19 images · non-contrast
Comparison: CT head dated June 30, 2017.

CLINICAL DATA: Dizziness.  Nausea.

EXAM:
CT HEAD WITHOUT CONTRAST
TECHNIQUE: Contiguous axial images were obtained from the base of the skull
through the vertex without intravenous contrast.

[Series 3: head wo · axial · 0.41mm/px · z∈[+89,+224]mm · 10 of 33 slices shown, 13 images]
[im 3/33  brain]
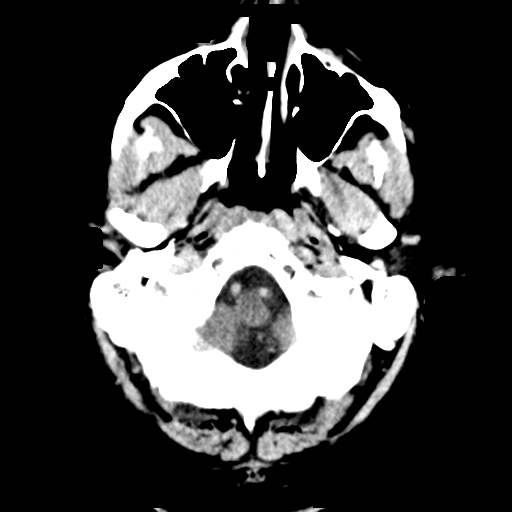
[im 3/33  bone]
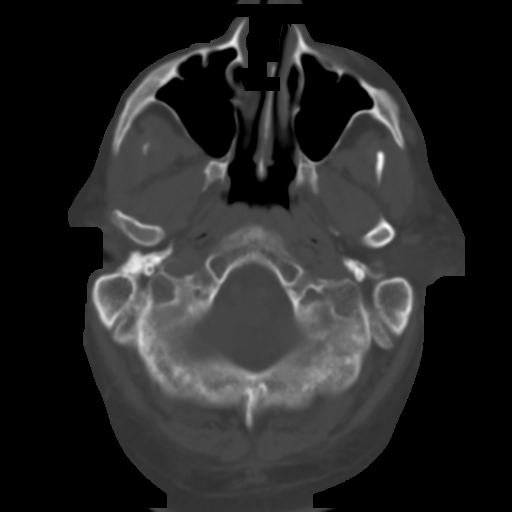
[im 6/33  brain]
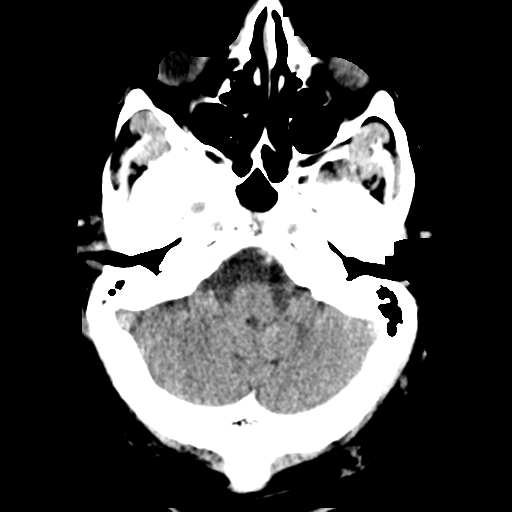
[im 9/33  brain]
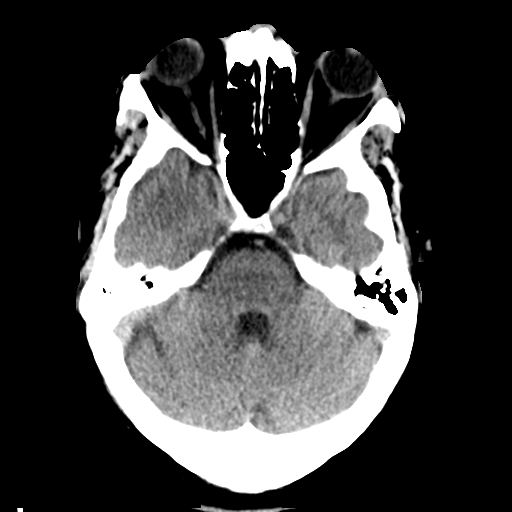
[im 12/33  brain]
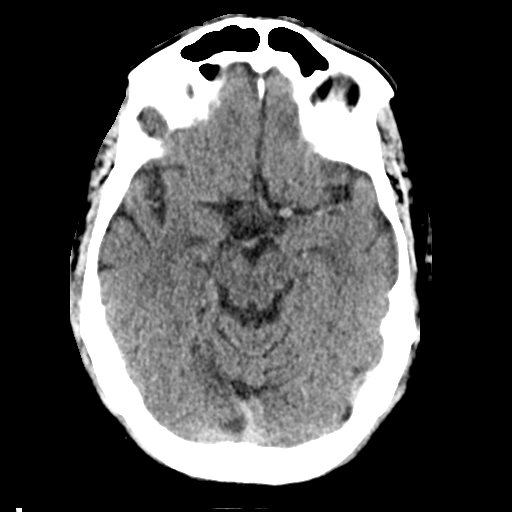
[im 15/33  brain]
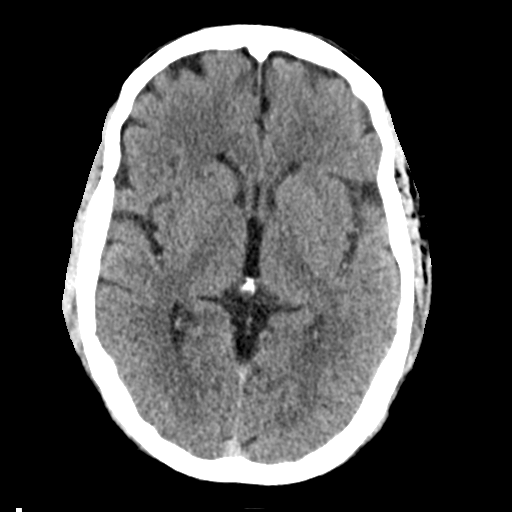
[im 15/33  bone]
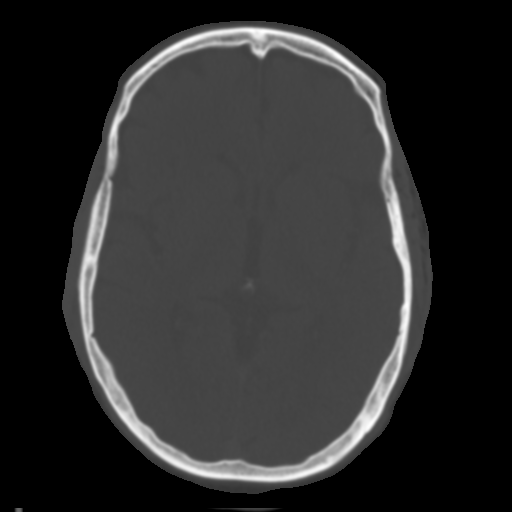
[im 18/33  brain]
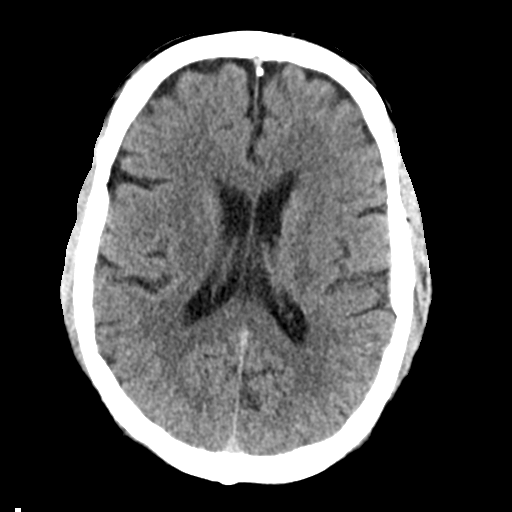
[im 21/33  brain]
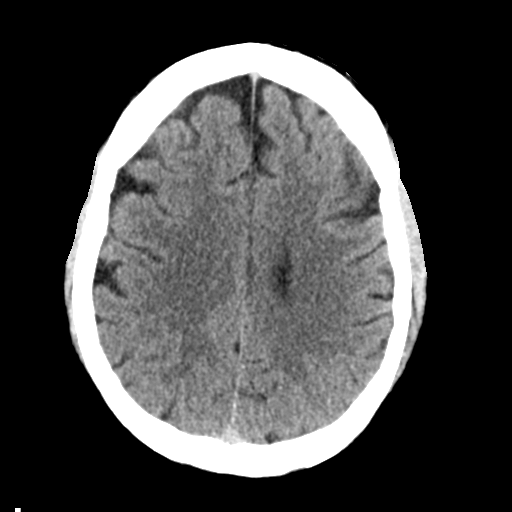
[im 25/33  brain]
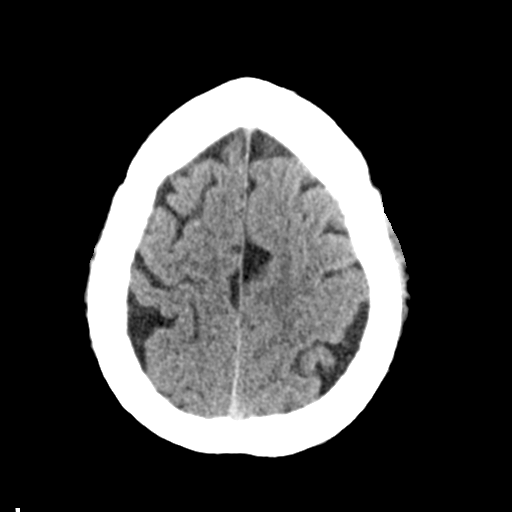
[im 27/33  brain]
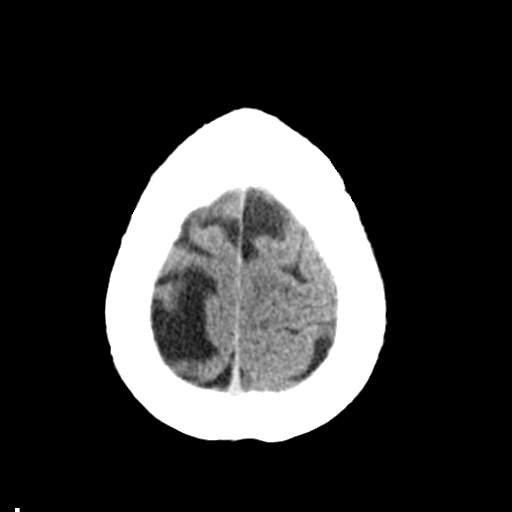
[im 27/33  bone]
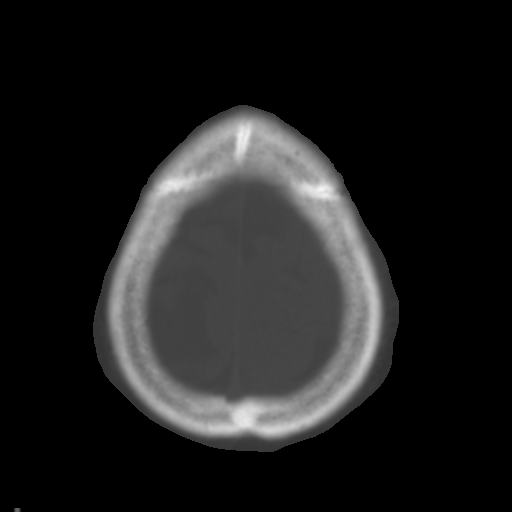
[im 30/33  brain]
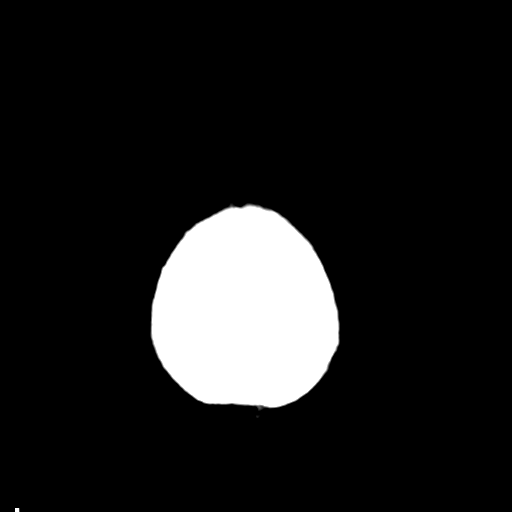

[Series 4: coronal soft tissue · coronal · 0.32mm/px · 3 of 71 slices shown]
[im 25/71  brain]
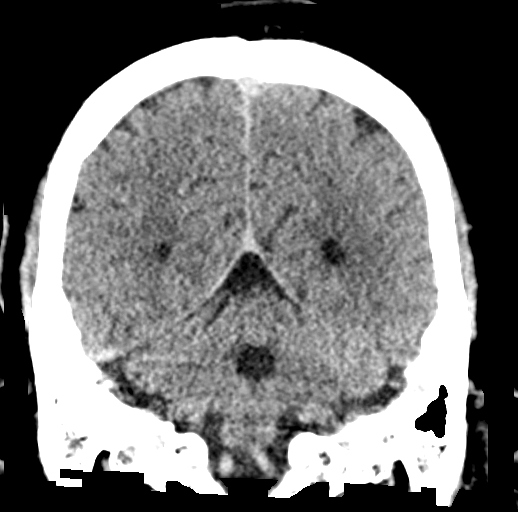
[im 32/71  brain]
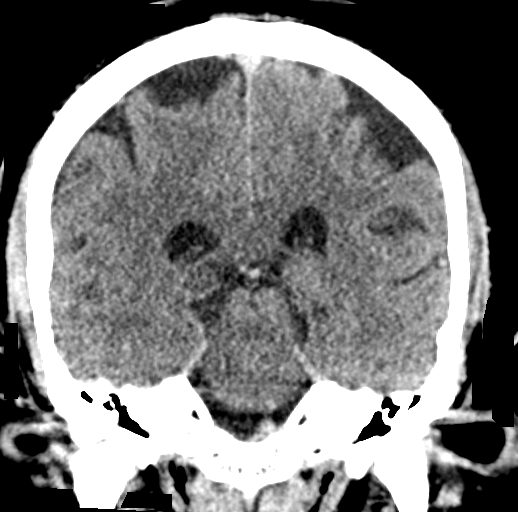
[im 39/71  brain]
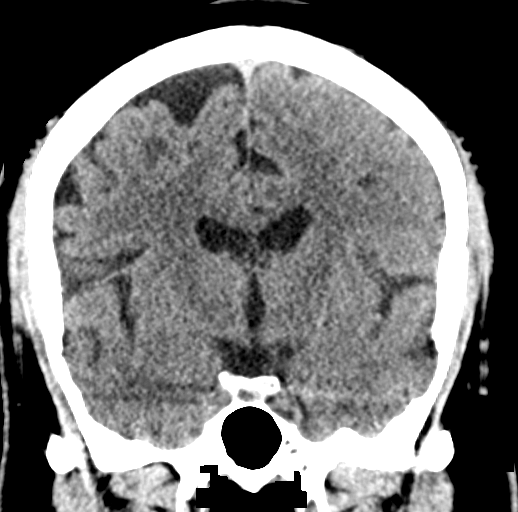

[Series 5: sagittal soft tissue · sagittal · 0.32mm/px · 3 of 55 slices shown]
[im 19/55  brain]
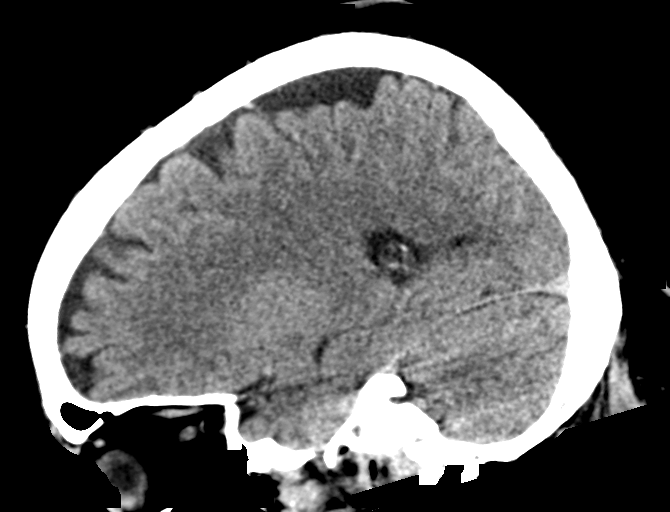
[im 28/55  brain]
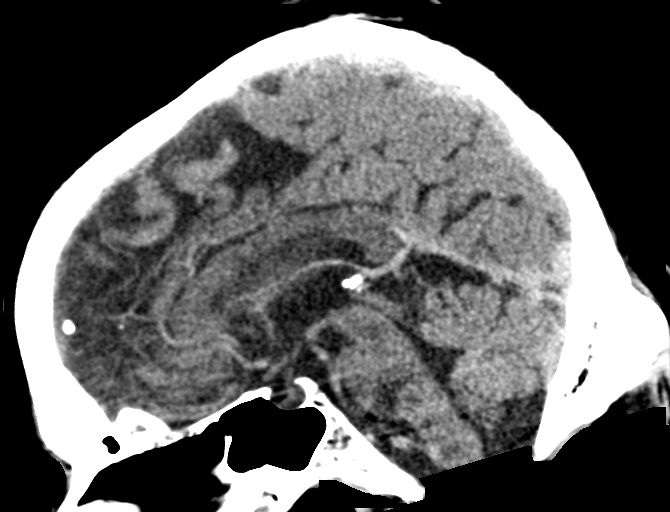
[im 37/55  brain]
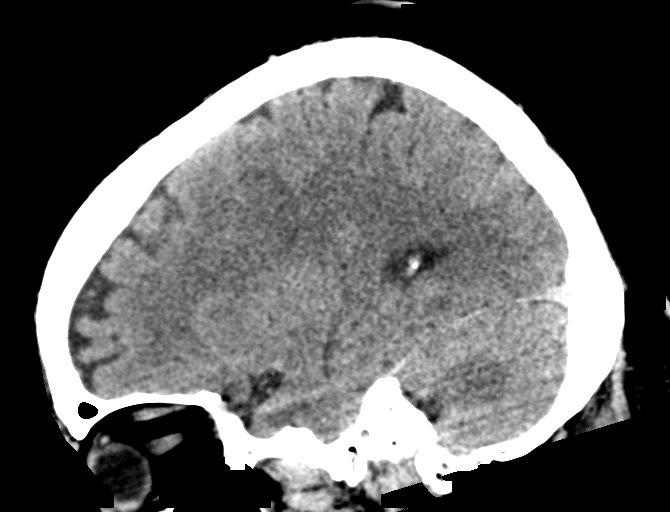

[16 of 47 positions shown; findings below may reference images not displayed]

FINDINGS: Brain: No evidence of acute infarction, hemorrhage, hydrocephalus,
extra-axial collection or mass lesion/mass effect. Mild atrophy is
noted.

Vascular: No hyperdense vessel or unexpected calcification.

Skull: Normal. Negative for fracture or focal lesion.

Sinuses/Orbits: No acute finding.

Other: None.
IMPRESSION: No acute intracranial abnormality.

## 2021-02-26 NOTE — Telephone Encounter (Signed)
Left message for patient regarding attendance with Cardiac Rehab. Will send letter to patient.

## 2021-02-28 ENCOUNTER — Encounter: Payer: Self-pay | Admitting: *Deleted

## 2021-02-28 DIAGNOSIS — I5042 Chronic combined systolic (congestive) and diastolic (congestive) heart failure: Secondary | ICD-10-CM

## 2021-02-28 NOTE — Progress Notes (Signed)
Pulmonary Individual Treatment Plan  Patient Details  Name: Gabriel [name]. MRN: [id number] Date of Birth: [birth year] Referring Provider:   Flowsheet Row Pulmonary Rehab from 01/17/2021 in [facility] Cardiac and Pulmonary Rehab  Referring Provider [name] MD       Initial Encounter Date:  Flowsheet Row Pulmonary Rehab from 01/17/2021 in [facility] Cardiac and Pulmonary Rehab  Date 01/17/21       Visit Diagnosis: Chronic combined systolic and diastolic congestive heart failure ([place])  Patient's Home Medications on Admission:  Current Outpatient Medications:    carvedilol (COREG) 6.25 MG tablet, Take 1 tablet (6.25 mg total) by mouth 2 (two) times daily with a meal., Disp: 180 tablet, Rfl: 3   cetirizine (ZYRTEC) 10 MG tablet, Take 10 mg by mouth daily., Disp: , Rfl:    ENTRESTO 49-51 MG, Take 1 tablet by mouth at bedtime., Disp: , Rfl:    fenofibrate (TRICOR) 145 MG tablet, Take 1 tablet (145 mg total) by mouth daily., Disp: 30 tablet, Rfl: 6   HUMALOG KWIKPEN 100 UNIT/ML KwikPen, Inject 3 Units into the skin 3 (three) times daily., Disp: , Rfl:    Insulin Glargine (BASAGLAR KWIKPEN) 100 UNIT/ML, Inject 28 Units into the skin at bedtime., Disp: 15 mL, Rfl: 2   Ipratropium-Albuterol (COMBIVENT RESPIMAT) 20-100 MCG/ACT AERS respimat, Inhale 1 puff into the lungs every 6 (six) hours as needed for wheezing., Disp: , Rfl:    lidocaine (LIDODERM) 5 %, Place 1 patch onto the skin every 12 (twelve) hours. Remove & Discard patch within 12 hours or as directed by MD, Disp: 10 patch, Rfl: 0   nitroGLYCERIN (NITROSTAT) 0.4 MG SL tablet, Place 1 tablet (0.4 mg total) under the tongue every 5 (five) minutes x 3 doses as needed for chest pain., Disp: 30 tablet, Rfl: 0   oxyCODONE-acetaminophen (PERCOCET) 5-325 MG tablet, Take 2 tablets by mouth every 6 (six) hours as needed for severe pain., Disp: 16 tablet, Rfl: 0   rivaroxaban (XARELTO) 20 MG TABS tablet, Take 1 tablet (20 mg total) by mouth daily with  supper., Disp: 30 tablet, Rfl: 0   rosuvastatin (CRESTOR) 20 MG tablet, Take 1 tablet (20 mg total) by mouth at bedtime., Disp: 30 tablet, Rfl: 6   torsemide (DEMADEX) 20 MG tablet, Take 1 tablet (20 mg total) by mouth daily., Disp: 30 tablet, Rfl: 0   TRULICITY 5.46 EV/0.3JK SOPN, INJECT 0.75 MG INTO THE SKIN ONCE A WEEK., Disp: 2 mL, Rfl: 2  Past Medical History: Past Medical History:  Diagnosis Date   CAD (coronary artery disease)    a. 04/2015 low risk MV;  b. 12/2016 Cath: minor irregs in LAD/Diag/LCX/OM, RCA 40p/m/d; c. 05/2020 Cath: LM nl, LAD 50d, LCX 30p, OM1 40, RCA 100p w/ L->L collats. CO/CI 3.1/1.3-->Med rx.   Chest wall pain, chronic    Chronic Troponin Elevation    CKD (chronic kidney disease), stage II-III    COPD (chronic obstructive pulmonary disease) (HCC)    Diabetes mellitus without complication ([place])    HFrEF (heart failure with reduced ejection fraction) ([place])    Hypertension    Resolved since weight loss   Mixed Ischemic & NICM (nonischemic cardiomyopathy) ([place])    a. 03/2015 Echo: EF 45-50%; b. 12/2015 Echo: EF 20-25%; c. 02/2016 Echo: EF 30-35%; d. 11/2016 Echo: EF 40-45%; e. 06/2019 Echo: EF 30-35%; f. 11/2019 Echo: EF 25-30%; g. 05/2020 Echo: EF 35-40%, glob HK, nl RV size/fxn, PASP 44.67mmHg, mod dil LA. Mild to mod MR.  Myocardial infarct [facility])    NSVT (nonsustained ventricular tachycardia) ([place])    a. 12/2015 noted on tele-->amio;  b. 12/2015 Event monitor: no VT. Atach noted.   Obesity (BMI 30.0-34.9)    Psoriasis    Syncope    a. 01/2016 - felt to be vasovagal.   Tobacco abuse     Tobacco Use: Social History   Tobacco Use  Smoking Status Former   Packs/day: 0.50   Years: 33.00   Pack years: 16.50   Types: Cigarettes   Quit date: 05/08/2020   Years since quitting: 0.8  Smokeless Tobacco Former   Quit date: 05/08/2020  Tobacco Comments   Quit Sept 2021    Labs: Recent Review Flowsheet Data     Labs for ITP Cardiac and Pulmonary Rehab Latest Ref Rng  & Units 06/07/2020 06/11/2020 06/23/2020 10/27/2020 11/23/2020   Cholestrol 0 - 200 mg/dL - 126 - - 185   LDLCALC 0 - 99 mg/dL - 75 - - UNABLE TO CALCULATE IF TRIGLYCERIDE OVER 400 mg/dL   LDLDIRECT 0 - 99 mg/dL - - - - 60.6   HDL >40 mg/dL - 25(L) - - 19(L)   Trlycerides <150 mg/dL - 130 - - 900(H)   Hemoglobin A1c 4.8 - 5.6 % 9.3(H) - 9.8(A) 7.1(H) -   PHART 7.350 - 7.450 - - - - -   PCO2ART 32.0 - 48.0 mmHg - - - - -   HCO3 20.0 - 28.0 mmol/L - - - - 33.5(H)   TCO2 0 - 100 mmol/L - - - - -   ACIDBASEDEF 0.0 - 2.0 mmol/L - - - - -   O2SAT % - - - - 86.9        Pulmonary Assessment Scores:  Pulmonary Assessment Scores     Row Name 01/17/21 1436         ADL UCSD   ADL Phase Entry     SOB Score total 12     Rest 0     Walk 1     Stairs 1     Bath 1     Dress 0     Shop 1           CAT Score     CAT Score 10           mMRC Score     mMRC Score 0             UCSD: Self-administered rating of dyspnea associated with activities of daily living (ADLs) 6-point scale (0 = "not at all" to 5 = "maximal or unable to do because of breathlessness")  Scoring Scores range from 0 to 120.  Minimally important difference is 5 units  CAT: CAT can identify the health impairment of COPD patients and is better correlated with disease progression.  CAT has a scoring range of zero to 40. The CAT score is classified into four groups of low (less than 10), medium (10 - 20), high (21-30) and very high (31-40) based on the impact level of disease on health status. A CAT score over 10 suggests significant symptoms.  A worsening CAT score could be explained by an exacerbation, poor medication adherence, poor inhaler technique, or progression of COPD or comorbid conditions.  CAT MCID is 2 points  mMRC: mMRC (Modified Medical Research Council) Dyspnea Scale is used to assess the degree of baseline functional disability in patients of respiratory disease due to dyspnea. No minimal important  difference is established. A  decrease in score of 1 point or greater is considered a positive change.   Pulmonary Function Assessment:   Exercise Target Goals: Exercise Program Goal: Individual exercise prescription set using results from initial 6 min walk test and THRR while considering  patient's activity barriers and safety.   Exercise Prescription Goal: Initial exercise prescription builds to 30-45 minutes a day of aerobic activity, 2-3 days per week.  Home exercise guidelines will be given to patient during program as part of exercise prescription that the participant will acknowledge.  Education: Aerobic Exercise: - Group verbal and visual presentation on the components of exercise prescription. Introduces F.I.T.T principle from ACSM for exercise prescriptions.  Reviews F.I.T.T. principles of aerobic exercise including progression. Written material given at graduation. Flowsheet Row Pulmonary Rehab from 01/17/2021 in [facility] Cardiac and Pulmonary Rehab  Education need identified 01/17/21       Education: Resistance Exercise: - Group verbal and visual presentation on the components of exercise prescription. Introduces F.I.T.T principle from ACSM for exercise prescriptions  Reviews F.I.T.T. principles of resistance exercise including progression. Written material given at graduation.    Education: Exercise & Equipment Safety: - Individual verbal instruction and demonstration of equipment use and safety with use of the equipment. Flowsheet Row Pulmonary Rehab from 01/17/2021 in [facility] Cardiac and Pulmonary Rehab  Date 01/17/21  Educator [facility]  Instruction Review Code 1- Verbalizes Understanding       Education: Exercise Physiology & General Exercise Guidelines: - Group verbal and written instruction with models to review the exercise physiology of the cardiovascular system and associated critical values. Provides general exercise guidelines with specific guidelines to those with heart or  lung disease.    Education: Flexibility, Balance, Mind/Body Relaxation: - Group verbal and visual presentation with interactive activity on the components of exercise prescription. Introduces F.I.T.T principle from ACSM for exercise prescriptions. Reviews F.I.T.T. principles of flexibility and balance exercise training including progression. Also discusses the mind body connection.  Reviews various relaxation techniques to help reduce and manage stress (i.e. Deep breathing, progressive muscle relaxation, and visualization). Balance handout provided to take home. Written material given at graduation.   Activity Barriers & Risk Stratification:  Activity Barriers & Cardiac Risk Stratification - 01/17/21 1421       Activity Barriers & Cardiac Risk Stratification   Activity Barriers Arthritis;Deconditioning;Decreased Ventricular Function             6 Minute Walk:  6 Minute Walk     Row Name 01/17/21 1417         6 Minute Walk   Phase Initial     Distance 1270 feet     Walk Time 6 minutes     # of Rest Breaks 0     MPH 2.41     METS 4.1     RPE 7     Perceived Dyspnea  0     VO2 Peak 14.34     Symptoms No     Resting HR 94 bpm     Resting BP 144/62     Resting Oxygen Saturation  95 %     Exercise Oxygen Saturation  during 6 min walk 93 %     Max Ex. HR 122 bpm     Max Ex. BP 162/74     2 Minute Post BP 122/60           Interval HR     1 Minute HR 109     2 Minute HR 115  3 Minute HR 122     4 Minute HR 106     5 Minute HR 105     6 Minute HR 106     2 Minute Post HR 102     Interval Heart Rate? Yes           Interval Oxygen     Interval Oxygen? Yes     Baseline Oxygen Saturation % 95 %     1 Minute Oxygen Saturation % 94 %     1 Minute Liters of Oxygen 0 L  Room Air     2 Minute Oxygen Saturation % 93 %     2 Minute Liters of Oxygen 0 L     3 Minute Oxygen Saturation % 94 %     3 Minute Liters of Oxygen 0 L     4 Minute Oxygen Saturation % 94 %     4  Minute Liters of Oxygen 0 L     5 Minute Oxygen Saturation % 95 %     5 Minute Liters of Oxygen 0 L     6 Minute Oxygen Saturation % 95 %     6 Minute Liters of Oxygen 0 L     2 Minute Post Oxygen Saturation % 95 %     2 Minute Post Liters of Oxygen 0 L            Oxygen Initial Assessment:  Oxygen Initial Assessment - 01/17/21 1436       Home Oxygen   Home Oxygen Device None    Sleep Oxygen Prescription CPAP    Liters per minute 0    Home Exercise Oxygen Prescription None    Home Resting Oxygen Prescription None    Compliance with Home Oxygen Use Yes      Initial 6 min Walk   Oxygen Used None      Program Oxygen Prescription   Program Oxygen Prescription None      Intervention   Short Term Goals To learn and demonstrate proper pursed lip breathing techniques or other breathing techniques. ;To learn and demonstrate proper use of respiratory medications;To learn and understand importance of maintaining oxygen saturations>88%;To learn and understand importance of monitoring SPO2 with pulse oximeter and demonstrate accurate use of the pulse oximeter.    Long  Term Goals Compliance with respiratory medication;Demonstrates proper use of MDI's;Exhibits proper breathing techniques, such as pursed lip breathing or other method taught during program session;Verbalizes importance of monitoring SPO2 with pulse oximeter and return demonstration;Maintenance of O2 saturations>88%             Oxygen Re-Evaluation:  Oxygen Re-Evaluation     Row Name 01/26/21 0852             Goals/Expected Outcomes   Short Term Goals To learn and demonstrate proper pursed lip breathing techniques or other breathing techniques. ;To learn and understand importance of maintaining oxygen saturations>88%;To learn and understand importance of monitoring SPO2 with pulse oximeter and demonstrate accurate use of the pulse oximeter.       Long  Term Goals Maintenance of O2 saturations>88%;Verbalizes  importance of monitoring SPO2 with pulse oximeter and return demonstration;Exhibits proper breathing techniques, such as pursed lip breathing or other method taught during program session       Comments Reviewed PLB technique with pt.  Talked about how it works and it's importance in maintaining their exercise saturations.       Goals/Expected Outcomes Short: Become more profiecient  at using PLB.   Long: Become independent at using PLB.                Oxygen Discharge (Final Oxygen Re-Evaluation):  Oxygen Re-Evaluation - 01/26/21 0852       Goals/Expected Outcomes   Short Term Goals To learn and demonstrate proper pursed lip breathing techniques or other breathing techniques. ;To learn and understand importance of maintaining oxygen saturations>88%;To learn and understand importance of monitoring SPO2 with pulse oximeter and demonstrate accurate use of the pulse oximeter.    Long  Term Goals Maintenance of O2 saturations>88%;Verbalizes importance of monitoring SPO2 with pulse oximeter and return demonstration;Exhibits proper breathing techniques, such as pursed lip breathing or other method taught during program session    Comments Reviewed PLB technique with pt.  Talked about how it works and it's importance in maintaining their exercise saturations.    Goals/Expected Outcomes Short: Become more profiecient at using PLB.   Long: Become independent at using PLB.             Initial Exercise Prescription:  Initial Exercise Prescription - 01/17/21 1400       Date of Initial Exercise RX and Referring Provider   Date 01/17/21    Referring Provider [name] MD      Treadmill   MPH 2.5    Grade 1    Minutes 15    METs 3.26      Recumbant Bike   Level 3    RPM 50    Watts 73    Minutes 15    METs 3      Recumbant Elliptical   Level 2    RPM 50    Minutes 15    METs 3      REL-XR   Level 3    Speed 50    Minutes 15    METs 3      Prescription Details    Frequency (times per week) 3    Duration Progress to 30 minutes of continuous aerobic without signs/symptoms of physical distress      Intensity   THRR 40-80% of Max Heartrate 124-154    Ratings of Perceived Exertion 11-13    Perceived Dyspnea 0-4      Progression   Progression Continue to progress workloads to maintain intensity without signs/symptoms of physical distress.      Resistance Training   Training Prescription Yes    Weight 5 lb    Reps 10-15             Perform Capillary Blood Glucose checks as needed.  Exercise Prescription Changes:   Exercise Prescription Changes     Row Name 01/17/21 1400 02/01/21 1000           Response to Exercise   Blood Pressure (Admit) 144/62 120/76      Blood Pressure (Exercise) 162/74 148/84      Blood Pressure (Exit) 122/60 130/76      Heart Rate (Admit) 94 bpm 87 bpm      Heart Rate (Exercise) 122 bpm 89 bpm      Heart Rate (Exit) 96 bpm --      Oxygen Saturation (Admit) 95 % --      Oxygen Saturation (Exercise) 93 % --      Oxygen Saturation (Exit) 96 % --      Rating of Perceived Exertion (Exercise) 7 13      Perceived Dyspnea (Exercise) 0 --  Symptoms none knee pain - left early      Comments walk test results --             T5 Nustep      Level -- 3      Minutes -- 15      METs -- 2              Exercise Comments:   Exercise Comments     Row Name 01/26/21 0851 01/30/21 0757         Exercise Comments First full day of exercise!  Patient was oriented to gym and equipment including functions, settings, policies, and procedures.  Patient's individual exercise prescription and treatment plan were reviewed.  All starting workloads were established based on the results of the 6 minute walk test done at initial orientation visit.  The plan for exercise progression was also introduced and progression will be customized based on patient's performance and goals. Pt was unable to follow exercise prescription  today due to knee pain exacerbated with exercise. Began with 3/10 knee pain, increasing to 8/10. Pt advised to consult with physician concerning knee pain.  Was able to exercise 34 minutes.               Exercise Goals and Review:   Exercise Goals     Row Name 01/17/21 1431             Exercise Goals   Increase Physical Activity Yes       Intervention Provide advice, education, support and counseling about physical activity/exercise needs.;Develop an individualized exercise prescription for aerobic and resistive training based on initial evaluation findings, risk stratification, comorbidities and participant's personal goals.       Expected Outcomes Short Term: Attend rehab on a regular basis to increase amount of physical activity.;Long Term: Add in home exercise to make exercise part of routine and to increase amount of physical activity.;Long Term: Exercising regularly at least 3-5 days a week.       Increase Strength and Stamina Yes       Intervention Provide advice, education, support and counseling about physical activity/exercise needs.;Develop an individualized exercise prescription for aerobic and resistive training based on initial evaluation findings, risk stratification, comorbidities and participant's personal goals.       Expected Outcomes Short Term: Increase workloads from initial exercise prescription for resistance, speed, and METs.;Short Term: Perform resistance training exercises routinely during rehab and add in resistance training at home;Long Term: Improve cardiorespiratory fitness, muscular endurance and strength as measured by increased METs and functional capacity (6MWT)       Able to understand and use rate of perceived exertion (RPE) scale Yes       Intervention Provide education and explanation on how to use RPE scale       Expected Outcomes Short Term: Able to use RPE daily in rehab to express subjective intensity level;Long Term:  Able to use RPE to guide  intensity level when exercising independently       Able to understand and use Dyspnea scale Yes       Intervention Provide education and explanation on how to use Dyspnea scale       Expected Outcomes Short Term: Able to use Dyspnea scale daily in rehab to express subjective sense of shortness of breath during exertion;Long Term: Able to use Dyspnea scale to guide intensity level when exercising independently       Knowledge and understanding of Target Heart Rate Range (  THRR) Yes       Intervention Provide education and explanation of THRR including how the numbers were predicted and where they are located for reference       Expected Outcomes Short Term: Able to state/look up THRR;Short Term: Able to use daily as guideline for intensity in rehab;Long Term: Able to use THRR to govern intensity when exercising independently       Able to check pulse independently Yes       Intervention Provide education and demonstration on how to check pulse in carotid and radial arteries.;Review the importance of being able to check your own pulse for safety during independent exercise       Expected Outcomes Short Term: Able to explain why pulse checking is important during independent exercise;Long Term: Able to check pulse independently and accurately       Understanding of Exercise Prescription Yes       Intervention Provide education, explanation, and written materials on patient's individual exercise prescription       Expected Outcomes Short Term: Able to explain program exercise prescription;Long Term: Able to explain home exercise prescription to exercise independently                Exercise Goals Re-Evaluation :  Exercise Goals Re-Evaluation     Row Name 01/26/21 0851 02/01/21 1001           Exercise Goal Re-Evaluation   Exercise Goals Review Increase Physical Activity;Able to understand and use rate of perceived exertion (RPE) scale;Knowledge and understanding of Target Heart Rate Range  (THRR);Understanding of Exercise Prescription;Able to understand and use Dyspnea scale;Increase Strength and Stamina;Able to check pulse independently Increase Strength and Stamina;Increase Physical Activity      Comments Reviewed RPE and dyspnea scales, THR and program prescription with pt today.  Pt voiced understanding and was given a copy of goals to take home. Melanie left early due to knee pain      Expected Outcomes Short: Use RPE daily to regulate intensity. Long: Follow program prescription in THR. Short: attend consistently Long:  improve overall stamina               Discharge Exercise Prescription (Final Exercise Prescription Changes):  Exercise Prescription Changes - 02/01/21 1000       Response to Exercise   Blood Pressure (Admit) 120/76    Blood Pressure (Exercise) 148/84    Blood Pressure (Exit) 130/76    Heart Rate (Admit) 87 bpm    Heart Rate (Exercise) 89 bpm    Rating of Perceived Exertion (Exercise) 13    Symptoms knee pain - left early      T5 Nustep   Level 3    Minutes 15    METs 2             Nutrition:  Target Goals: Understanding of nutrition guidelines, daily intake of sodium 1500mg , cholesterol 200mg , calories 30% from fat and 7% or less from saturated fats, daily to have 5 or more servings of fruits and vegetables.  Education: All About Nutrition: -Group instruction provided by verbal, written material, interactive activities, discussions, models, and posters to present general guidelines for heart healthy nutrition including fat, fiber, MyPlate, the role of sodium in heart healthy nutrition, utilization of the nutrition label, and utilization of this knowledge for meal planning. Follow up email sent as well. Written material given at graduation. Flowsheet Row Pulmonary Rehab from 01/17/2021 in [facility] Cardiac and Pulmonary Rehab  Education need identified 01/17/21  Biometrics:  Pre Biometrics - 01/17/21 1432       Pre Biometrics    Height 6' (1.829 m)    Weight 242 lb 3.2 oz (109.9 kg)    BMI (Calculated) 32.84    Single Leg Stand 30 seconds              Nutrition Therapy Plan and Nutrition Goals:   Nutrition Assessments:  MEDIFICTS Score Key: ?70 Need to make dietary changes  40-70 Heart Healthy Diet ? 40 Therapeutic Level Cholesterol Diet  Flowsheet Row Pulmonary Rehab from 01/17/2021 in [facility] Cardiac and Pulmonary Rehab  Picture Your Plate Total Score on Admission 80      Picture Your Plate Scores: <64 Unhealthy dietary pattern with much room for improvement. 41-50 Dietary pattern unlikely to meet recommendations for good health and room for improvement. 51-60 More healthful dietary pattern, with some room for improvement.  >60 Healthy dietary pattern, although there may be some specific behaviors that could be improved.   Nutrition Goals Re-Evaluation:   Nutrition Goals Discharge (Final Nutrition Goals Re-Evaluation):   Psychosocial: Target Goals: Acknowledge presence or absence of significant depression and/or stress, maximize coping skills, provide positive support system. Participant is able to verbalize types and ability to use techniques and skills needed for reducing stress and depression.   Education: Stress, Anxiety, and Depression - Group verbal and visual presentation to define topics covered.  Reviews how body is impacted by stress, anxiety, and depression.  Also discusses healthy ways to reduce stress and to treat/manage anxiety and depression.  Written material given at graduation.   Education: Sleep Hygiene -Provides group verbal and written instruction about how sleep can affect your health.  Define sleep hygiene, discuss sleep cycles and impact of sleep habits. Review good sleep hygiene tips.    Initial Review & Psychosocial Screening:  Initial Psych Review & Screening - 01/12/21 1012       Initial Review   Current issues with None Identified      Family Dynamics   Good  Support System? Yes   Family and friends     Barriers   Psychosocial barriers to participate in program There are no identifiable barriers or psychosocial needs.;The patient should benefit from training in stress management and relaxation.      Screening Interventions   Interventions Encouraged to exercise;To provide support and resources with identified psychosocial needs;Provide feedback about the scores to participant    Expected Outcomes Short Term goal: Utilizing psychosocial counselor, staff and physician to assist with identification of specific Stressors or current issues interfering with healing process. Setting desired goal for each stressor or current issue identified.;Long Term Goal: Stressors or current issues are controlled or eliminated.;Short Term goal: Identification and review with participant of any Quality of Life or Depression concerns found by scoring the questionnaire.;Long Term goal: The participant improves quality of Life and PHQ9 Scores as seen by post scores and/or verbalization of changes             Quality of Life Scores:  Quality of Life - 01/17/21 1432       Quality of Life   Select Quality of Life      Quality of Life Scores   Health/Function Pre 25.23 %    Socioeconomic Pre 30 %    Psych/Spiritual Pre 28.29 %    Family Pre 21 %    GLOBAL Pre 26.46 %            Scores of 19 and  below usually indicate a poorer quality of life in these areas.  A difference of  2-3 points is a clinically meaningful difference.  A difference of 2-3 points in the total score of the Quality of Life Index has been associated with significant improvement in overall quality of life, self-image, physical symptoms, and general health in studies assessing change in quality of life.  PHQ-9: Recent Review Flowsheet Data     Depression screen [facility] 2/9 01/17/2021 06/23/2020 01/27/2018 12/01/2017 09/15/2017   Decreased Interest 2 0 0 0 0   Down, Depressed, Hopeless 0 0 0 3 1   PHQ  - 2 Score 2 0 0 3 1   Altered sleeping 2 0 - 3 -   Tired, decreased energy 3 1 - 3 -   Change in appetite 2 0 - 1 -   Feeling bad or failure about yourself  0 0 - 3 -   Trouble concentrating 1 0 - 1 -   Moving slowly or fidgety/restless 0 0 - 0 -   Suicidal thoughts 0 0 - 3 -   PHQ-9 Score 10 1 - 17 -   Difficult doing work/chores Somewhat difficult Not difficult at all - Extremely dIfficult -      Interpretation of Total Score  Total Score Depression Severity:  1-4 = Minimal depression, 5-9 = Mild depression, 10-14 = Moderate depression, 15-19 = Moderately severe depression, 20-27 = Severe depression   Psychosocial Evaluation and Intervention:  Psychosocial Evaluation - 01/12/21 1025       Psychosocial Evaluation & Interventions   Interventions Encouraged to exercise with the program and follow exercise prescription    Comments Harlo has no barriers to attending the program. His support system his his family and friends. A t the moment he lives with his Dad. He is hoping to get healthier by attending the program. He also want s to lose some weight,especially some of the belly weight.  He is ready to start the program and should do well.    Expected Outcomes STG: Derrek attends all scheduled sessions and the education classes. LTG: Robbi is able to continue the progress he gained while in the program.    Continue Psychosocial Services  Follow up required by staff             Psychosocial Re-Evaluation:   Psychosocial Discharge (Final Psychosocial Re-Evaluation):   Education: Education Goals: Education classes will be provided on a weekly basis, covering required topics. Participant will state understanding/return demonstration of topics presented.  Learning Barriers/Preferences:  Learning Barriers/Preferences - 01/12/21 1014       Learning Barriers/Preferences   Learning Barriers None    Learning Preferences Skilled Demonstration             General  Pulmonary Education Topics:  Infection Prevention: - Provides verbal and written material to individual with discussion of infection control including proper hand washing and proper equipment cleaning during exercise session. Flowsheet Row Pulmonary Rehab from 01/17/2021 in [facility] Cardiac and Pulmonary Rehab  Date 01/17/21  Educator [facility]  Instruction Review Code 1- Verbalizes Understanding       Falls Prevention: - Provides verbal and written material to individual with discussion of falls prevention and safety. Flowsheet Row Pulmonary Rehab from 01/17/2021 in [facility] Cardiac and Pulmonary Rehab  Date 01/12/21  Educator SB  Instruction Review Code 1- Verbalizes Understanding       Chronic Lung Disease Review: - Group verbal instruction with posters, models, PowerPoint presentations and videos,  to review new updates, new respiratory medications, new advancements in procedures and treatments. Providing information on websites and "800" numbers for continued self-education. Includes information about supplement oxygen, available portable oxygen systems, continuous and intermittent flow rates, oxygen safety, concentrators, and Medicare reimbursement for oxygen. Explanation of Pulmonary Drugs, including class, frequency, complications, importance of spacers, rinsing mouth after steroid MDI's, and proper cleaning methods for nebulizers. Review of basic lung anatomy and physiology related to function, structure, and complications of lung disease. Review of risk factors. Discussion about methods for diagnosing sleep apnea and types of masks and machines for OSA. Includes a review of the use of types of environmental controls: home humidity, furnaces, filters, dust mite/pet prevention, HEPA vacuums. Discussion about weather changes, air quality and the benefits of nasal washing. Instruction on Warning signs, infection symptoms, calling MD promptly, preventive modes, and value of vaccinations. Review of effective  airway clearance, coughing and/or vibration techniques. Emphasizing that all should Create an Action Plan. Written material given at graduation.   AED/CPR: - Group verbal and written instruction with the use of models to demonstrate the basic use of the AED with the basic ABC's of resuscitation.    Anatomy and Cardiac Procedures: - Group verbal and visual presentation and models provide information about basic cardiac anatomy and function. Reviews the testing methods done to diagnose heart disease and the outcomes of the test results. Describes the treatment choices: Medical Management, Angioplasty, or Coronary Bypass Surgery for treating various heart conditions including Myocardial Infarction, Angina, Valve Disease, and Cardiac Arrhythmias.  Written material given at graduation. Flowsheet Row Pulmonary Rehab from 01/17/2021 in [facility] Cardiac and Pulmonary Rehab  Education need identified 01/17/21       Medication Safety: - Group verbal and visual instruction to review commonly prescribed medications for heart and lung disease. Reviews the medication, class of the drug, and side effects. Includes the steps to properly store meds and maintain the prescription regimen.  Written material given at graduation.   Other: -Provides group and verbal instruction on various topics (see comments)   Knowledge Questionnaire Score:  Knowledge Questionnaire Score - 01/17/21 1433       Knowledge Questionnaire Score   Pre Score Cardiac 22/26 (Angina, nutrition, exericse, PAD) Pulmonary 15/18 (O2 safety)              Core Components/Risk Factors/Patient Goals at Admission:  Personal Goals and Risk Factors at Admission - 01/17/21 1435       Core Components/Risk Factors/Patient Goals on Admission    Weight Management Yes;Weight Loss;Obesity    Intervention Weight Management: Develop a combined nutrition and exercise program designed to reach desired caloric intake, while maintaining appropriate  intake of nutrient and fiber, sodium and fats, and appropriate energy expenditure required for the weight goal.;Weight Management: Provide education and appropriate resources to help participant work on and attain dietary goals.;Weight Management/Obesity: Establish reasonable short term and long term weight goals.;Obesity: Provide education and appropriate resources to help participant work on and attain dietary goals.    Admit Weight 242 lb 3.2 oz (109.9 kg)    Goal Weight: Short Term 235 lb (106.6 kg)    Goal Weight: Long Term 200 lb (90.7 kg)    Expected Outcomes Short Term: Continue to assess and modify interventions until short term weight is achieved;Long Term: Adherence to nutrition and physical activity/exercise program aimed toward attainment of established weight goal;Weight Loss: Understanding of general recommendations for a balanced deficit meal plan, which promotes 1-2 lb weight loss per  week and includes a negative energy balance of [phone] kcal/d;Understanding recommendations for meals to include 15-35% energy as protein, 25-35% energy from fat, 35-60% energy from carbohydrates, less than 200mg  of dietary cholesterol, 20-35 gm of total fiber daily;Understanding of distribution of calorie intake throughout the day with the consumption of 4-5 meals/snacks    Diabetes Yes    Intervention Provide education about signs/symptoms and action to take for hypo/hyperglycemia.;Provide education about proper nutrition, including hydration, and aerobic/resistive exercise prescription along with prescribed medications to achieve blood glucose in normal ranges: Fasting glucose 65-99 mg/dL    Expected Outcomes Short Term: Participant verbalizes understanding of the signs/symptoms and immediate care of hyper/hypoglycemia, proper foot care and importance of medication, aerobic/resistive exercise and nutrition plan for blood glucose control.;Long Term: Attainment of HbA1C < 7%.    Heart Failure Yes     Intervention Provide a combined exercise and nutrition program that is supplemented with education, support and counseling about heart failure. Directed toward relieving symptoms such as shortness of breath, decreased exercise tolerance, and extremity edema.    Expected Outcomes Improve functional capacity of life;Short term: Attendance in program 2-3 days a week with increased exercise capacity. Reported lower sodium intake. Reported increased fruit and vegetable intake. Reports medication compliance.;Short term: Daily weights obtained and reported for increase. Utilizing diuretic protocols set by physician.;Long term: Adoption of self-care skills and reduction of barriers for early signs and symptoms recognition and intervention leading to self-care maintenance.    Hypertension Yes    Intervention Provide education on lifestyle modifcations including regular physical activity/exercise, weight management, moderate sodium restriction and increased consumption of fresh fruit, vegetables, and low fat dairy, alcohol moderation, and smoking cessation.;Monitor prescription use compliance.    Expected Outcomes Short Term: Continued assessment and intervention until BP is < 140/75mm HG in hypertensive participants. < 130/74mm HG in hypertensive participants with diabetes, heart failure or chronic kidney disease.;Long Term: Maintenance of blood pressure at goal levels.    Lipids Yes    Intervention Provide education and support for participant on nutrition & aerobic/resistive exercise along with prescribed medications to achieve LDL 70mg , HDL >40mg .    Expected Outcomes Short Term: Participant states understanding of desired cholesterol values and is compliant with medications prescribed. Participant is following exercise prescription and nutrition guidelines.;Long Term: Cholesterol controlled with medications as prescribed, with individualized exercise RX and with personalized nutrition plan. Value goals: LDL <  70mg , HDL > 40 mg.             Education:Diabetes - Individual verbal and written instruction to review signs/symptoms of diabetes, desired ranges of glucose level fasting, after meals and with exercise. Acknowledge that pre and post exercise glucose checks will be done for 3 sessions at entry of program. Flowsheet Row Pulmonary Rehab from 01/17/2021 in [facility] Cardiac and Pulmonary Rehab  Date 01/17/21  Educator [facility]  Instruction Review Code 1- Verbalizes Understanding       Know Your Numbers and Heart Failure: - Group verbal and visual instruction to discuss disease risk factors for cardiac and pulmonary disease and treatment options.  Reviews associated critical values for Overweight/Obesity, Hypertension, Cholesterol, and Diabetes.  Discusses basics of heart failure: signs/symptoms and treatments.  Introduces Heart Failure Zone chart for action plan for heart failure.  Written material given at graduation.   Core Components/Risk Factors/Patient Goals Review:    Core Components/Risk Factors/Patient Goals at Discharge (Final Review):    ITP Comments:  ITP Comments     Row Name 01/12/21 1024  01/17/21 1412 01/26/21 0851 01/30/21 0757 01/31/21 0716   ITP Comments Virtual orientation call completed today. he has an appointment on Date: 01/17/2021 for EP eval and gym Orientation.  Documentation of diagnosis can be found in [facility] Date: 01/17/2021. Completed 6MWT and gym orientation. Initial ITP created and sent for review to Dr. Emily Filbert, Medical Director. First full day of exercise!  Patient was oriented to gym and equipment including functions, settings, policies, and procedures.  Patient's individual exercise prescription and treatment plan were reviewed.  All starting workloads were established based on the results of the 6 minute walk test done at initial orientation visit.  The plan for exercise progression was also introduced and progression will be customized based on patient's  performance and goals. Pt was unable to follow exercise prescription today due to knee pain exacerbated with exercise. Began with 3/10 knee pain, increasing to 8/10. Pt advised to consult with physician concerning knee pain.  Was able to exercise 34 minutes. 30 Day review completed. Medical Director ITP review done, changes made as directed, and signed approval by Medical Director.    [place] Name 02/01/21 1525 02/28/21 0640         ITP Comments Spoke with patient, any movement with his knee bothers him and he states he is unsure if he will be able to continue rehab. Recommended trying other machines that may work better. Encouraged patient to call doctor and make appointment to get knee checked out. Will place on 1 week hold for patient to find relief and call us again in 1 week to decide if he'd like to continue or discharge. 30 Day review completed. Medical Director ITP review done, changes made as directed, and signed approval by Medical Director.               Comments:

## 2021-03-01 ENCOUNTER — Other Ambulatory Visit: Payer: Self-pay

## 2021-03-01 ENCOUNTER — Emergency Department
Admission: EM | Admit: 2021-03-01 | Discharge: 2021-03-01 | Disposition: A | Discharge status: Home / Self Care | Payer: Medicaid Other | Attending: Emergency Medicine | Admitting: Emergency Medicine

## 2021-03-01 ENCOUNTER — Emergency Department: Payer: Medicaid Other

## 2021-03-01 DIAGNOSIS — N1831 Chronic kidney disease, stage 3a: Secondary | ICD-10-CM | POA: Diagnosis not present

## 2021-03-01 DIAGNOSIS — I2510 Atherosclerotic heart disease of native coronary artery without angina pectoris: Secondary | ICD-10-CM | POA: Insufficient documentation

## 2021-03-01 DIAGNOSIS — Z20822 Contact with and (suspected) exposure to covid-19: Secondary | ICD-10-CM | POA: Insufficient documentation

## 2021-03-01 DIAGNOSIS — J441 Chronic obstructive pulmonary disease with (acute) exacerbation: Secondary | ICD-10-CM | POA: Diagnosis not present

## 2021-03-01 DIAGNOSIS — E1169 Type 2 diabetes mellitus with other specified complication: Secondary | ICD-10-CM | POA: Insufficient documentation

## 2021-03-01 DIAGNOSIS — I130 Hypertensive heart and chronic kidney disease with heart failure and stage 1 through stage 4 chronic kidney disease, or unspecified chronic kidney disease: Secondary | ICD-10-CM | POA: Diagnosis not present

## 2021-03-01 DIAGNOSIS — R0600 Dyspnea, unspecified: Secondary | ICD-10-CM

## 2021-03-01 DIAGNOSIS — R0609 Other forms of dyspnea: Secondary | ICD-10-CM | POA: Diagnosis not present

## 2021-03-01 DIAGNOSIS — I5043 Acute on chronic combined systolic (congestive) and diastolic (congestive) heart failure: Secondary | ICD-10-CM | POA: Insufficient documentation

## 2021-03-01 DIAGNOSIS — E1122 Type 2 diabetes mellitus with diabetic chronic kidney disease: Secondary | ICD-10-CM | POA: Diagnosis not present

## 2021-03-01 DIAGNOSIS — Z87891 Personal history of nicotine dependence: Secondary | ICD-10-CM | POA: Insufficient documentation

## 2021-03-01 DIAGNOSIS — Z794 Long term (current) use of insulin: Secondary | ICD-10-CM | POA: Diagnosis not present

## 2021-03-01 DIAGNOSIS — E785 Hyperlipidemia, unspecified: Secondary | ICD-10-CM | POA: Diagnosis not present

## 2021-03-01 DIAGNOSIS — Z7901 Long term (current) use of anticoagulants: Secondary | ICD-10-CM | POA: Insufficient documentation

## 2021-03-01 LAB — RESP PANEL BY RT-PCR (FLU A&B, COVID) ARPGX2
Influenza A by PCR: NEGATIVE
Influenza B by PCR: NEGATIVE
SARS Coronavirus 2 by RT PCR: NEGATIVE

## 2021-03-01 LAB — BASIC METABOLIC PANEL
Anion gap: 7 (ref 5–15)
BUN: 18 mg/dL (ref 6–20)
CO2: 22 mmol/L (ref 22–32)
Calcium: 8.8 mg/dL — ABNORMAL LOW (ref 8.9–10.3)
Chloride: 110 mmol/L (ref 98–111)
Creatinine, Ser: 0.87 mg/dL (ref 0.61–1.24)
GFR, Estimated: >60 mL/min (ref >60)
Glucose, Bld: 202 mg/dL — ABNORMAL HIGH (ref 70–99)
Potassium: 4.3 mmol/L (ref 3.5–5.1)
Sodium: 139 mmol/L (ref 135–145)

## 2021-03-01 LAB — CBC
HCT: 43.9 % (ref 39.0–52.0)
Hemoglobin: 14.6 g/dL (ref 13.0–17.0)
MCH: 27.9 pg (ref 26.0–34.0)
MCHC: 33.3 g/dL (ref 30.0–36.0)
MCV: 83.8 fL (ref 80.0–100.0)
Platelets: 262 10*3/uL (ref 150–400)
RBC: 5.24 MIL/uL (ref 4.22–5.81)
RDW: 15.1 % (ref 11.5–15.5)
WBC: 8.6 10*3/uL (ref 4.0–10.5)
nRBC: 0 % (ref 0.0–0.2)

## 2021-03-01 LAB — TROPONIN I (HIGH SENSITIVITY): Troponin I (High Sensitivity): 41 ng/L — ABNORMAL HIGH (ref <18)

## 2021-03-01 LAB — D-DIMER, QUANTITATIVE: D-Dimer, Quant: 0.4 ug/mL-FEU (ref 0.00–0.50)

## 2021-03-01 MED ORDER — DEXAMETHASONE 6 MG PO TABS
6.0000 mg | ORAL_TABLET | Freq: Once | ORAL | Status: DC
  Filled 2021-03-01: qty 1

## 2021-03-01 NOTE — ED Provider Notes (Signed)
[facility] Emergency Department Provider Note  Time seen: 10:03 AM  I have reviewed the triage vital signs and the nursing notes.   HISTORY  Chief Complaint Shortness of Breath  HPI Gabriel [name]. is a 50 y.o. male with a past medical history of CAD, CKD, COPD, CHF, diabetes, presents to the emergency department for shortness of breath.  According to the patient over the past 2 to 3 days he has had worsening shortness of breath especially with exertion.  States he has had a slight cough as well.  And on occasion will feel his heart racing.  Patient describes a burning sensation in the chest when he takes a deep breath but no "pain."   Past Medical History:  Diagnosis Date   CAD (coronary artery disease)    a. 04/2015 low risk MV;  b. 12/2016 Cath: minor irregs in LAD/Diag/LCX/OM, RCA 40p/m/d; c. 05/2020 Cath: LM nl, LAD 50d, LCX 30p, OM1 40, RCA 100p w/ L->L collats. CO/CI 3.1/1.3-->Med rx.   Chest wall pain, chronic    Chronic Troponin Elevation    CKD (chronic kidney disease), stage II-III    COPD (chronic obstructive pulmonary disease) (HCC)    Diabetes mellitus without complication ([place])    HFrEF (heart failure with reduced ejection fraction) ([place])    Hypertension    Resolved since weight loss   Mixed Ischemic & NICM (nonischemic cardiomyopathy) ([place])    a. 03/2015 Echo: EF 45-50%; b. 12/2015 Echo: EF 20-25%; c. 02/2016 Echo: EF 30-35%; d. 11/2016 Echo: EF 40-45%; e. 06/2019 Echo: EF 30-35%; f. 11/2019 Echo: EF 25-30%; g. 05/2020 Echo: EF 35-40%, glob HK, nl RV size/fxn, PASP 44.62mmHg, mod dil LA. Mild to mod MR.   Myocardial infarct [facility])    NSVT (nonsustained ventricular tachycardia) ([place])    a. 12/2015 noted on tele-->amio;  b. 12/2015 Event monitor: no VT. Atach noted.   Obesity (BMI 30.0-34.9)    Psoriasis    Syncope    a. 01/2016 - felt to be vasovagal.   Tobacco abuse     Patient Active Problem List   Diagnosis Date Noted   AKI (acute kidney  injury) ([place]) 11/24/2020   Acute kidney injury ([place]) 11/23/2020   OSA on CPAP 11/23/2020   Chest pain 10/27/2020   Snoring 08/15/2020   Type II diabetes mellitus with renal manifestations ([place]) 06/11/2020   COPD (chronic obstructive pulmonary disease) ([place]) 06/11/2020   Leukocytosis 06/11/2020   PE (pulmonary thromboembolism) ([place]) 06/11/2020   Hyperglycemia    Elevated brain natriuretic peptide (BNP) level    Uncontrolled type 2 diabetes mellitus with hyperglycemia, with long-term current use of insulin ([place]) 05/13/2020   Acute on chronic HFrEF (heart failure with reduced ejection fraction) ([place]) 05/12/2020   Erectile dysfunction due to arterial insufficiency 03/15/2020   Screening PSA (prostate specific antigen) 03/15/2020   Erectile dysfunction 03/02/2020   Acute pulmonary embolism ([place]) 01/03/2020   Pulmonary embolism on right [facility])    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[place]) 12/31/2019   Hemoptysis    Costochondritis    Acute respiratory failure with hypoxia ([place])    Acute on chronic combined systolic (congestive) and diastolic (congestive) heart failure ([place]) 12/20/2019   Acute on chronic heart failure ([place]) 11/22/2019   CKD (chronic kidney disease), stage IIIa 06/26/2019   Type 2 diabetes mellitus with hyperlipidemia ([place]) 06/26/2019   Hypertensive urgency 06/26/2019   Acute on chronic combined systolic and diastolic CHF (congestive heart failure) ([place]) 09/02/2017   Other chest pain  08/24/2017   Dizziness    Noncompliance with medications 04/29/2017   Back pain 03/06/2017   Tobacco use 12/04/2016   Gallbladder sludge 12/04/2016   Acute on chronic systolic CHF (congestive heart failure) ([place]) 11/28/2016   Coronary artery disease, non-occlusive 03/15/2016   Atypical chest pain 02/16/2016   Orthostatic hypotension 01/23/2016   Hypomagnesemia 00/76/2263   Chronic systolic CHF (congestive heart failure) ([place]) 01/23/2016   NICM (nonischemic cardiomyopathy) ([place]) 01/17/2016   NSVT  (nonsustained ventricular tachycardia) (HCC)    Troponin I above reference range    Hyperlipidemia 05/17/2015   COPD exacerbation ([place]) 05/17/2015   Essential hypertension 03/31/2015   Elevated troponin 03/20/2015    Past Surgical History:  Procedure Laterality Date   AMPUTATION     CARDIAC CATHETERIZATION     FINGER AMPUTATION     Traumatic   FINGER FRACTURE SURGERY Left    LEFT HEART CATH AND CORONARY ANGIOGRAPHY N/A 01/06/2017   Procedure: Left Heart Cath and Coronary Angiography;  Surgeon: Wellington Hampshire, MD;  Location: [place] [facility];  Service: Cardiovascular;  Laterality: N/A;   RIGHT/LEFT HEART CATH AND CORONARY ANGIOGRAPHY N/A 06/13/2020   Procedure: RIGHT/LEFT HEART CATH AND CORONARY ANGIOGRAPHY;  Surgeon: Nelva Bush, MD;  Location: [place];  Service: Cardiovascular;  Laterality: N/A;    Prior to Admission medications   Medication Sig Start Date End Date Taking? Authorizing Provider  carvedilol (COREG) 6.25 MG tablet Take 1 tablet (6.25 mg total) by mouth 2 (two) times daily with a meal. 08/02/20   Larey Dresser, MD  cetirizine (ZYRTEC) 10 MG tablet Take 10 mg by mouth daily.    [provider]  ENTRESTO 49-51 MG Take 1 tablet by mouth at bedtime. 11/30/20   [provider]  fenofibrate (TRICOR) 145 MG tablet Take 1 tablet (145 mg total) by mouth daily. 06/09/20   Lorella Nimrod, MD  HUMALOG KWIKPEN 100 UNIT/ML KwikPen Inject 3 Units into the skin 3 (three) times daily. 11/27/20   [provider]  Insulin Glargine (BASAGLAR KWIKPEN) 100 UNIT/ML Inject 28 Units into the skin at bedtime. 06/09/20   Lorella Nimrod, MD  Ipratropium-Albuterol (COMBIVENT RESPIMAT) 20-100 MCG/ACT AERS respimat Inhale 1 puff into the lungs every 6 (six) hours as needed for wheezing.    [provider]  lidocaine (LIDODERM) 5 % Place 1 patch onto the skin every 12 (twelve) hours. Remove & Discard patch within 12 hours or as directed by MD  02/15/21 02/15/22  [name], MD  nitroGLYCERIN (NITROSTAT) 0.4 MG SL tablet Place 1 tablet (0.4 mg total) under the tongue every 5 (five) minutes x 3 doses as needed for chest pain. 06/09/20   Lorella Nimrod, MD  oxyCODONE-acetaminophen (PERCOCET) 5-325 MG tablet Take 2 tablets by mouth every 6 (six) hours as needed for severe pain. 02/15/21   [name], MD  rivaroxaban (XARELTO) 20 MG TABS tablet Take 1 tablet (20 mg total) by mouth daily with supper. 11/07/20   Cammie Sickle, MD  rosuvastatin (CRESTOR) 20 MG tablet Take 1 tablet (20 mg total) by mouth at bedtime. 06/09/20   Lorella Nimrod, MD  torsemide (DEMADEX) 20 MG tablet Take 1 tablet (20 mg total) by mouth daily. 11/27/20   Dessa Phi, DO  TRULICITY 3.35 KT/6.2BW SOPN INJECT 0.75 MG INTO THE SKIN ONCE A WEEK. 09/28/20   Trinna Post, PA-C    Allergies  Allergen Reactions   Metformin And Related Nausea And Vomiting   Prednisone Other (See  Comments)    Reaction: Hallucinations     Zantac [Ranitidine Hcl] Diarrhea and Nausea Only    Night sweats    Family History  Problem Relation Age of Onset   Diabetes Mellitus II Mother    Hypothyroidism Mother    Hypertension Mother    Hypertension Father    Gout Father    Cancer Paternal Aunt    Cancer Maternal Grandfather    Diabetes Maternal Grandfather     Social History Social History   Tobacco Use   Smoking status: Former    Packs/day: 0.50    Years: 33.00    Pack years: 16.50    Types: Cigarettes    Quit date: 05/08/2020    Years since quitting: 0.8   Smokeless tobacco: Former    Quit date: 05/08/2020   Tobacco comments:    Quit Sept 2021  Vaping Use   Vaping Use: Former  Substance Use Topics   Alcohol use: Yes    Alcohol/week: 0.0 standard drinks    Comment: occassionally   Drug use: No    Review of Systems Constitutional: Negative for fever. Cardiovascular: Burning with deep inspiration. Respiratory: Positive for shortness of breath, worse  with exertion Gastrointestinal: Negative for abdominal pain, vomiting Musculoskeletal: Negative for musculoskeletal complaints Neurological: Negative for headache All other ROS negative  ____________________________________________   PHYSICAL EXAM:  VITAL SIGNS: ED Triage Vitals  Enc Vitals Group     BP 03/01/21 0903 (!) 161/104     Pulse Rate 03/01/21 0903 97     Resp 03/01/21 0903 (!) 24     Temp 03/01/21 0903 98.4 F (36.9 C)     Temp Source 03/01/21 0903 Oral     SpO2 03/01/21 0903 97 %     Weight 03/01/21 0858 245 lb (111.1 kg)     Height 03/01/21 0858 6' (1.829 m)     Head Circumference --      Peak Flow --      Pain Score 03/01/21 0858 0     Pain Loc --      Pain Edu? --      Excl. in [place]? --    Constitutional: Alert and oriented. Well appearing and in no distress. Eyes: Normal exam ENT      Head: Normocephalic and atraumatic.      Mouth/Throat: Mucous membranes are moist. Cardiovascular: Normal rate, regular rhythm. No murmur Respiratory: Normal respiratory effort without tachypnea nor retractions. Breath sounds are clear Gastrointestinal: Soft and nontender. No distention.  Musculoskeletal: Nontender with normal range of motion in all extremities. Neurologic:  Normal speech and language. No gross focal neurologic deficits  Skin:  Skin is warm, dry and intact.  Psychiatric: Mood and affect are normal.  ____________________________________________    EKG  EKG viewed and interpreted by myself shows a normal sinus rhythm at 95 bpm with a narrow QRS, normal axis, normal intervals with nonconcerning ST changes.  ____________________________________________    RADIOLOGY  Chest x-ray shows no acute abnormality  ____________________________________________   INITIAL IMPRESSION / ASSESSMENT AND PLAN / ED COURSE  Pertinent labs & imaging results that were available during my care of the patient were reviewed by me and considered in my medical decision making  (see chart for details).   Patient presents to the emergency department for 2 to 3 days of worsening shortness of breath, chest burning with inspiration.  Overall the patient appears well, no distress.  Satting in the mid 90s on room air mildly tachypneic heart  rate around 90 bpm.  Given the somewhat pleuritic nature of the pain burning with inspiration we will check labs including a D-dimer, cardiac enzyme and continue to closely monitor.  We will also check a COVID test given the cough and shortness of breath complaint.  Patient agreeable to plan of care.  Patient's work-up is overall reassuring.  Troponin slightly elevated however unchanged from historical values.  Patient's D-dimer is negative.  COVID/flu is negative.  Lab work at baseline.  Vital signs reassuring including 97 to 98% room air saturation throughout my evaluation.  Given the patient's subjective shortness of breath we will discharge with short course of steroids for possible COPD exacerbation.  We will have the patient follow-up with his doctor.  I discussed return precautions.  Patient agreeable to plan of care.  Gabriel [name]. was evaluated in Emergency Department on 03/01/2021 for the symptoms described in the history of present illness. He was evaluated in the context of the global COVID-19 pandemic, which necessitated consideration that the patient might be at risk for infection with the SARS-CoV-2 virus that causes COVID-19. Institutional protocols and algorithms that pertain to the evaluation of patients at risk for COVID-19 are in a state of rapid change based on information released by regulatory bodies including the CDC and federal and state organizations. These policies and algorithms were followed during the patient's care in the ED.  ____________________________________________   FINAL CLINICAL IMPRESSION(S) / ED DIAGNOSES  Dyspnea   Harvest Dark, MD 03/01/21 1147

## 2021-03-01 NOTE — ED Triage Notes (Signed)
Pt c/o increased SOB since Tuesday, pt has a hx of COPD and states his inhalers have not been helping. Pt is tachypnic on arrival

## 2021-03-08 DIAGNOSIS — I5042 Chronic combined systolic (congestive) and diastolic (congestive) heart failure: Secondary | ICD-10-CM

## 2021-03-08 NOTE — Progress Notes (Signed)
Discharge Progress Report  Patient Details  Name: Gabriel [name]. MRN: [id number] Date of Birth: 10-07-1970 Referring Provider:   Flowsheet Row Pulmonary Rehab from 01/17/2021 in [facility]  Referring Provider [name] MD        Number of Visits: 3  Reason for Discharge:  Early Exit:  Lack of attendance  Smoking History:  Social History   Tobacco Use  Smoking Status Former   Packs/day: 0.50   Years: 33.00   Pack years: 16.50   Types: Cigarettes   Quit date: 05/08/2020   Years since quitting: 0.8  Smokeless Tobacco Former   Quit date: 05/08/2020  Tobacco Comments   Quit Sept 2021    Diagnosis:  Chronic combined systolic and diastolic congestive heart failure ([place])  ADL UCSD:  Pulmonary Assessment Scores     Row Name 01/17/21 1436         ADL UCSD   ADL Phase Entry     SOB Score total 12     Rest 0     Walk 1     Stairs 1     Bath 1     Dress 0     Shop 1           CAT Score     CAT Score 10           mMRC Score     mMRC Score 0             Initial Exercise Prescription:  Initial Exercise Prescription - 01/17/21 1400       Date of Initial Exercise RX and Referring Provider   Date 01/17/21    Referring Provider [name] MD      Treadmill   MPH 2.5    Grade 1    Minutes 15    METs 3.26      Recumbant Bike   Level 3    RPM 50    Watts 73    Minutes 15    METs 3      Recumbant Elliptical   Level 2    RPM 50    Minutes 15    METs 3      REL-XR   Level 3    Speed 50    Minutes 15    METs 3      Prescription Details   Frequency (times per week) 3    Duration Progress to 30 minutes of continuous aerobic without signs/symptoms of physical distress      Intensity   THRR 40-80% of Max Heartrate 124-154    Ratings of Perceived Exertion 11-13    Perceived Dyspnea 0-4      Progression   Progression Continue to progress workloads to maintain intensity without signs/symptoms of physical  distress.      Resistance Training   Training Prescription Yes    Weight 5 lb    Reps 10-15             Discharge Exercise Prescription (Final Exercise Prescription Changes):  Exercise Prescription Changes - 02/01/21 1000       Response to Exercise   Blood Pressure (Admit) 120/76    Blood Pressure (Exercise) 148/84    Blood Pressure (Exit) 130/76    Heart Rate (Admit) 87 bpm    Heart Rate (Exercise) 89 bpm    Rating of Perceived Exertion (Exercise) 13    Symptoms knee pain - left early      T5  Nustep   Level 3    Minutes 15    METs 2             Functional Capacity:  6 Minute Walk     Row Name 01/17/21 1417         6 Minute Walk   Phase Initial     Distance 1270 feet     Walk Time 6 minutes     # of Rest Breaks 0     MPH 2.41     METS 4.1     RPE 7     Perceived Dyspnea  0     VO2 Peak 14.34     Symptoms No     Resting HR 94 bpm     Resting BP 144/62     Resting Oxygen Saturation  95 %     Exercise Oxygen Saturation  during 6 min walk 93 %     Max Ex. HR 122 bpm     Max Ex. BP 162/74     2 Minute Post BP 122/60           Interval HR     1 Minute HR 109     2 Minute HR 115     3 Minute HR 122     4 Minute HR 106     5 Minute HR 105     6 Minute HR 106     2 Minute Post HR 102     Interval Heart Rate? Yes           Interval Oxygen     Interval Oxygen? Yes     Baseline Oxygen Saturation % 95 %     1 Minute Oxygen Saturation % 94 %     1 Minute Liters of Oxygen 0 L  Room Air     2 Minute Oxygen Saturation % 93 %     2 Minute Liters of Oxygen 0 L     3 Minute Oxygen Saturation % 94 %     3 Minute Liters of Oxygen 0 L     4 Minute Oxygen Saturation % 94 %     4 Minute Liters of Oxygen 0 L     5 Minute Oxygen Saturation % 95 %     5 Minute Liters of Oxygen 0 L     6 Minute Oxygen Saturation % 95 %     6 Minute Liters of Oxygen 0 L     2 Minute Post Oxygen Saturation % 95 %     2 Minute Post Liters of Oxygen 0 L              Psychological, QOL, Others - Outcomes: PHQ 2/9: Depression screen [facility] 2/9 01/17/2021 06/23/2020 01/27/2018 12/01/2017 09/15/2017  Decreased Interest 2 0 0 0 0  Down, Depressed, Hopeless 0 0 0 3 1  PHQ - 2 Score 2 0 0 3 1  Altered sleeping 2 0 - 3 -  Tired, decreased energy 3 1 - 3 -  Change in appetite 2 0 - 1 -  Feeling bad or failure about yourself  0 0 - 3 -  Trouble concentrating 1 0 - 1 -  Moving slowly or fidgety/restless 0 0 - 0 -  Suicidal thoughts 0 0 - 3 -  PHQ-9 Score 10 1 - 17 -  Difficult doing work/chores Somewhat difficult Not difficult at all - Extremely dIfficult -  Some recent data might be hidden  Nutrition & Weight - Outcomes:  Pre Biometrics - 01/17/21 1432       Pre Biometrics   Height 6' (1.829 m)    Weight 242 lb 3.2 oz (109.9 kg)    BMI (Calculated) 32.84    Single Leg Stand 30 seconds               Goals reviewed with patient; copy given to patient.

## 2021-03-08 NOTE — Progress Notes (Signed)
Pulmonary Individual Treatment Plan  Patient Details  Name: Gabriel [name]. MRN: [id number] Date of Birth: July 04, 1971 Referring Provider:   Flowsheet Row Pulmonary Rehab from 01/17/2021 in [facility] Cardiac and Pulmonary Rehab  Referring Provider [name] MD       Initial Encounter Date:  Flowsheet Row Pulmonary Rehab from 01/17/2021 in [facility] Cardiac and Pulmonary Rehab  Date 01/17/21       Visit Diagnosis: Chronic combined systolic and diastolic congestive heart failure ([place])  Patient's Home Medications on Admission:  Current Outpatient Medications:    carvedilol (COREG) 6.25 MG tablet, Take 1 tablet (6.25 mg total) by mouth 2 (two) times daily with a meal., Disp: 180 tablet, Rfl: 3   cetirizine (ZYRTEC) 10 MG tablet, Take 10 mg by mouth daily., Disp: , Rfl:    ENTRESTO 49-51 MG, Take 1 tablet by mouth at bedtime., Disp: , Rfl:    fenofibrate (TRICOR) 145 MG tablet, Take 1 tablet (145 mg total) by mouth daily., Disp: 30 tablet, Rfl: 6   HUMALOG KWIKPEN 100 UNIT/ML KwikPen, Inject 3 Units into the skin 3 (three) times daily., Disp: , Rfl:    Insulin Glargine (BASAGLAR KWIKPEN) 100 UNIT/ML, Inject 28 Units into the skin at bedtime., Disp: 15 mL, Rfl: 2   Ipratropium-Albuterol (COMBIVENT RESPIMAT) 20-100 MCG/ACT AERS respimat, Inhale 1 puff into the lungs every 6 (six) hours as needed for wheezing., Disp: , Rfl:    lidocaine (LIDODERM) 5 %, Place 1 patch onto the skin every 12 (twelve) hours. Remove & Discard patch within 12 hours or as directed by MD, Disp: 10 patch, Rfl: 0   nitroGLYCERIN (NITROSTAT) 0.4 MG SL tablet, Place 1 tablet (0.4 mg total) under the tongue every 5 (five) minutes x 3 doses as needed for chest pain., Disp: 30 tablet, Rfl: 0   oxyCODONE-acetaminophen (PERCOCET) 5-325 MG tablet, Take 2 tablets by mouth every 6 (six) hours as needed for severe pain., Disp: 16 tablet, Rfl: 0   rivaroxaban (XARELTO) 20 MG TABS tablet, Take 1 tablet (20 mg total) by mouth daily with  supper., Disp: 30 tablet, Rfl: 0   rosuvastatin (CRESTOR) 20 MG tablet, Take 1 tablet (20 mg total) by mouth at bedtime., Disp: 30 tablet, Rfl: 6   torsemide (DEMADEX) 20 MG tablet, Take 1 tablet (20 mg total) by mouth daily., Disp: 30 tablet, Rfl: 0   TRULICITY 9.35 TS/1.7BL SOPN, INJECT 0.75 MG INTO THE SKIN ONCE A WEEK., Disp: 2 mL, Rfl: 2  Past Medical History: Past Medical History:  Diagnosis Date   CAD (coronary artery disease)    a. 04/2015 low risk MV;  b. 12/2016 Cath: minor irregs in LAD/Diag/LCX/OM, RCA 40p/m/d; c. 05/2020 Cath: LM nl, LAD 50d, LCX 30p, OM1 40, RCA 100p w/ L->L collats. CO/CI 3.1/1.3-->Med rx.   Chest wall pain, chronic    Chronic Troponin Elevation    CKD (chronic kidney disease), stage II-III    COPD (chronic obstructive pulmonary disease) (HCC)    Diabetes mellitus without complication ([place])    HFrEF (heart failure with reduced ejection fraction) ([place])    Hypertension    Resolved since weight loss   Mixed Ischemic & NICM (nonischemic cardiomyopathy) ([place])    a. 03/2015 Echo: EF 45-50%; b. 12/2015 Echo: EF 20-25%; c. 02/2016 Echo: EF 30-35%; d. 11/2016 Echo: EF 40-45%; e. 06/2019 Echo: EF 30-35%; f. 11/2019 Echo: EF 25-30%; g. 05/2020 Echo: EF 35-40%, glob HK, nl RV size/fxn, PASP 44.40mmHg, mod dil LA. Mild to mod MR.  Myocardial infarct [facility])    NSVT (nonsustained ventricular tachycardia) ([place])    a. 12/2015 noted on tele-->amio;  b. 12/2015 Event monitor: no VT. Atach noted.   Obesity (BMI 30.0-34.9)    Psoriasis    Syncope    a. 01/2016 - felt to be vasovagal.   Tobacco abuse     Tobacco Use: Social History   Tobacco Use  Smoking Status Former   Packs/day: 0.50   Years: 33.00   Pack years: 16.50   Types: Cigarettes   Quit date: 05/08/2020   Years since quitting: 0.8  Smokeless Tobacco Former   Quit date: 05/08/2020  Tobacco Comments   Quit Sept 2021    Labs: Recent Review Flowsheet Data     Labs for ITP Cardiac and Pulmonary Rehab Latest Ref Rng  & Units 06/07/2020 06/11/2020 06/23/2020 10/27/2020 11/23/2020   Cholestrol 0 - 200 mg/dL - 126 - - 185   LDLCALC 0 - 99 mg/dL - 75 - - UNABLE TO CALCULATE IF TRIGLYCERIDE OVER 400 mg/dL   LDLDIRECT 0 - 99 mg/dL - - - - 60.6   HDL >40 mg/dL - 25(L) - - 19(L)   Trlycerides <150 mg/dL - 130 - - 900(H)   Hemoglobin A1c 4.8 - 5.6 % 9.3(H) - 9.8(A) 7.1(H) -   PHART 7.350 - 7.450 - - - - -   PCO2ART 32.0 - 48.0 mmHg - - - - -   HCO3 20.0 - 28.0 mmol/L - - - - 33.5(H)   TCO2 0 - 100 mmol/L - - - - -   ACIDBASEDEF 0.0 - 2.0 mmol/L - - - - -   O2SAT % - - - - 86.9        Pulmonary Assessment Scores:  Pulmonary Assessment Scores     Row Name 01/17/21 1436         ADL UCSD   ADL Phase Entry     SOB Score total 12     Rest 0     Walk 1     Stairs 1     Bath 1     Dress 0     Shop 1           CAT Score     CAT Score 10           mMRC Score     mMRC Score 0             UCSD: Self-administered rating of dyspnea associated with activities of daily living (ADLs) 6-point scale (0 = "not at all" to 5 = "maximal or unable to do because of breathlessness")  Scoring Scores range from 0 to 120.  Minimally important difference is 5 units  CAT: CAT can identify the health impairment of COPD patients and is better correlated with disease progression.  CAT has a scoring range of zero to 40. The CAT score is classified into four groups of low (less than 10), medium (10 - 20), high (21-30) and very high (31-40) based on the impact level of disease on health status. A CAT score over 10 suggests significant symptoms.  A worsening CAT score could be explained by an exacerbation, poor medication adherence, poor inhaler technique, or progression of COPD or comorbid conditions.  CAT MCID is 2 points  mMRC: mMRC (Modified Medical Research Council) Dyspnea Scale is used to assess the degree of baseline functional disability in patients of respiratory disease due to dyspnea. No minimal important  difference is established. A  decrease in score of 1 point or greater is considered a positive change.   Pulmonary Function Assessment:   Exercise Target Goals: Exercise Program Goal: Individual exercise prescription set using results from initial 6 min walk test and THRR while considering  patient's activity barriers and safety.   Exercise Prescription Goal: Initial exercise prescription builds to 30-45 minutes a day of aerobic activity, 2-3 days per week.  Home exercise guidelines will be given to patient during program as part of exercise prescription that the participant will acknowledge.  Education: Aerobic Exercise: - Group verbal and visual presentation on the components of exercise prescription. Introduces F.I.T.T principle from ACSM for exercise prescriptions.  Reviews F.I.T.T. principles of aerobic exercise including progression. Written material given at graduation. Flowsheet Row Pulmonary Rehab from 01/17/2021 in [facility] Cardiac and Pulmonary Rehab  Education need identified 01/17/21       Education: Resistance Exercise: - Group verbal and visual presentation on the components of exercise prescription. Introduces F.I.T.T principle from ACSM for exercise prescriptions  Reviews F.I.T.T. principles of resistance exercise including progression. Written material given at graduation.    Education: Exercise & Equipment Safety: - Individual verbal instruction and demonstration of equipment use and safety with use of the equipment. Flowsheet Row Pulmonary Rehab from 01/17/2021 in [facility] Cardiac and Pulmonary Rehab  Date 01/17/21  Educator [facility]  Instruction Review Code 1- Verbalizes Understanding       Education: Exercise Physiology & General Exercise Guidelines: - Group verbal and written instruction with models to review the exercise physiology of the cardiovascular system and associated critical values. Provides general exercise guidelines with specific guidelines to those with heart or  lung disease.    Education: Flexibility, Balance, Mind/Body Relaxation: - Group verbal and visual presentation with interactive activity on the components of exercise prescription. Introduces F.I.T.T principle from ACSM for exercise prescriptions. Reviews F.I.T.T. principles of flexibility and balance exercise training including progression. Also discusses the mind body connection.  Reviews various relaxation techniques to help reduce and manage stress (i.e. Deep breathing, progressive muscle relaxation, and visualization). Balance handout provided to take home. Written material given at graduation.   Activity Barriers & Risk Stratification:  Activity Barriers & Cardiac Risk Stratification - 01/17/21 1421       Activity Barriers & Cardiac Risk Stratification   Activity Barriers Arthritis;Deconditioning;Decreased Ventricular Function             6 Minute Walk:  6 Minute Walk     Row Name 01/17/21 1417         6 Minute Walk   Phase Initial     Distance 1270 feet     Walk Time 6 minutes     # of Rest Breaks 0     MPH 2.41     METS 4.1     RPE 7     Perceived Dyspnea  0     VO2 Peak 14.34     Symptoms No     Resting HR 94 bpm     Resting BP 144/62     Resting Oxygen Saturation  95 %     Exercise Oxygen Saturation  during 6 min walk 93 %     Max Ex. HR 122 bpm     Max Ex. BP 162/74     2 Minute Post BP 122/60           Interval HR     1 Minute HR 109     2 Minute HR 115  3 Minute HR 122     4 Minute HR 106     5 Minute HR 105     6 Minute HR 106     2 Minute Post HR 102     Interval Heart Rate? Yes           Interval Oxygen     Interval Oxygen? Yes     Baseline Oxygen Saturation % 95 %     1 Minute Oxygen Saturation % 94 %     1 Minute Liters of Oxygen 0 L  Room Air     2 Minute Oxygen Saturation % 93 %     2 Minute Liters of Oxygen 0 L     3 Minute Oxygen Saturation % 94 %     3 Minute Liters of Oxygen 0 L     4 Minute Oxygen Saturation % 94 %     4  Minute Liters of Oxygen 0 L     5 Minute Oxygen Saturation % 95 %     5 Minute Liters of Oxygen 0 L     6 Minute Oxygen Saturation % 95 %     6 Minute Liters of Oxygen 0 L     2 Minute Post Oxygen Saturation % 95 %     2 Minute Post Liters of Oxygen 0 L            Oxygen Initial Assessment:  Oxygen Initial Assessment - 01/17/21 1436       Home Oxygen   Home Oxygen Device None    Sleep Oxygen Prescription CPAP    Liters per minute 0    Home Exercise Oxygen Prescription None    Home Resting Oxygen Prescription None    Compliance with Home Oxygen Use Yes      Initial 6 min Walk   Oxygen Used None      Program Oxygen Prescription   Program Oxygen Prescription None      Intervention   Short Term Goals To learn and demonstrate proper pursed lip breathing techniques or other breathing techniques. ;To learn and demonstrate proper use of respiratory medications;To learn and understand importance of maintaining oxygen saturations>88%;To learn and understand importance of monitoring SPO2 with pulse oximeter and demonstrate accurate use of the pulse oximeter.    Long  Term Goals Compliance with respiratory medication;Demonstrates proper use of MDI's;Exhibits proper breathing techniques, such as pursed lip breathing or other method taught during program session;Verbalizes importance of monitoring SPO2 with pulse oximeter and return demonstration;Maintenance of O2 saturations>88%             Oxygen Re-Evaluation:  Oxygen Re-Evaluation     Row Name 01/26/21 0852             Goals/Expected Outcomes   Short Term Goals To learn and demonstrate proper pursed lip breathing techniques or other breathing techniques. ;To learn and understand importance of maintaining oxygen saturations>88%;To learn and understand importance of monitoring SPO2 with pulse oximeter and demonstrate accurate use of the pulse oximeter.       Long  Term Goals Maintenance of O2 saturations>88%;Verbalizes  importance of monitoring SPO2 with pulse oximeter and return demonstration;Exhibits proper breathing techniques, such as pursed lip breathing or other method taught during program session       Comments Reviewed PLB technique with pt.  Talked about how it works and it's importance in maintaining their exercise saturations.       Goals/Expected Outcomes Short: Become more profiecient  at using PLB.   Long: Become independent at using PLB.                Oxygen Discharge (Final Oxygen Re-Evaluation):  Oxygen Re-Evaluation - 01/26/21 0852       Goals/Expected Outcomes   Short Term Goals To learn and demonstrate proper pursed lip breathing techniques or other breathing techniques. ;To learn and understand importance of maintaining oxygen saturations>88%;To learn and understand importance of monitoring SPO2 with pulse oximeter and demonstrate accurate use of the pulse oximeter.    Long  Term Goals Maintenance of O2 saturations>88%;Verbalizes importance of monitoring SPO2 with pulse oximeter and return demonstration;Exhibits proper breathing techniques, such as pursed lip breathing or other method taught during program session    Comments Reviewed PLB technique with pt.  Talked about how it works and it's importance in maintaining their exercise saturations.    Goals/Expected Outcomes Short: Become more profiecient at using PLB.   Long: Become independent at using PLB.             Initial Exercise Prescription:  Initial Exercise Prescription - 01/17/21 1400       Date of Initial Exercise RX and Referring Provider   Date 01/17/21    Referring Provider [name] MD      Treadmill   MPH 2.5    Grade 1    Minutes 15    METs 3.26      Recumbant Bike   Level 3    RPM 50    Watts 73    Minutes 15    METs 3      Recumbant Elliptical   Level 2    RPM 50    Minutes 15    METs 3      REL-XR   Level 3    Speed 50    Minutes 15    METs 3      Prescription Details    Frequency (times per week) 3    Duration Progress to 30 minutes of continuous aerobic without signs/symptoms of physical distress      Intensity   THRR 40-80% of Max Heartrate 124-154    Ratings of Perceived Exertion 11-13    Perceived Dyspnea 0-4      Progression   Progression Continue to progress workloads to maintain intensity without signs/symptoms of physical distress.      Resistance Training   Training Prescription Yes    Weight 5 lb    Reps 10-15             Perform Capillary Blood Glucose checks as needed.  Exercise Prescription Changes:   Exercise Prescription Changes     Row Name 01/17/21 1400 02/01/21 1000           Response to Exercise   Blood Pressure (Admit) 144/62 120/76      Blood Pressure (Exercise) 162/74 148/84      Blood Pressure (Exit) 122/60 130/76      Heart Rate (Admit) 94 bpm 87 bpm      Heart Rate (Exercise) 122 bpm 89 bpm      Heart Rate (Exit) 96 bpm --      Oxygen Saturation (Admit) 95 % --      Oxygen Saturation (Exercise) 93 % --      Oxygen Saturation (Exit) 96 % --      Rating of Perceived Exertion (Exercise) 7 13      Perceived Dyspnea (Exercise) 0 --  Symptoms none knee pain - left early      Comments walk test results --             T5 Nustep      Level -- 3      Minutes -- 15      METs -- 2              Exercise Comments:   Exercise Comments     Row Name 01/26/21 0851 01/30/21 0757         Exercise Comments First full day of exercise!  Patient was oriented to gym and equipment including functions, settings, policies, and procedures.  Patient's individual exercise prescription and treatment plan were reviewed.  All starting workloads were established based on the results of the 6 minute walk test done at initial orientation visit.  The plan for exercise progression was also introduced and progression will be customized based on patient's performance and goals. Pt was unable to follow exercise prescription  today due to knee pain exacerbated with exercise. Began with 3/10 knee pain, increasing to 8/10. Pt advised to consult with physician concerning knee pain.  Was able to exercise 34 minutes.               Exercise Goals and Review:   Exercise Goals     Row Name 01/17/21 1431             Exercise Goals   Increase Physical Activity Yes       Intervention Provide advice, education, support and counseling about physical activity/exercise needs.;Develop an individualized exercise prescription for aerobic and resistive training based on initial evaluation findings, risk stratification, comorbidities and participant's personal goals.       Expected Outcomes Short Term: Attend rehab on a regular basis to increase amount of physical activity.;Long Term: Add in home exercise to make exercise part of routine and to increase amount of physical activity.;Long Term: Exercising regularly at least 3-5 days a week.       Increase Strength and Stamina Yes       Intervention Provide advice, education, support and counseling about physical activity/exercise needs.;Develop an individualized exercise prescription for aerobic and resistive training based on initial evaluation findings, risk stratification, comorbidities and participant's personal goals.       Expected Outcomes Short Term: Increase workloads from initial exercise prescription for resistance, speed, and METs.;Short Term: Perform resistance training exercises routinely during rehab and add in resistance training at home;Long Term: Improve cardiorespiratory fitness, muscular endurance and strength as measured by increased METs and functional capacity (6MWT)       Able to understand and use rate of perceived exertion (RPE) scale Yes       Intervention Provide education and explanation on how to use RPE scale       Expected Outcomes Short Term: Able to use RPE daily in rehab to express subjective intensity level;Long Term:  Able to use RPE to guide  intensity level when exercising independently       Able to understand and use Dyspnea scale Yes       Intervention Provide education and explanation on how to use Dyspnea scale       Expected Outcomes Short Term: Able to use Dyspnea scale daily in rehab to express subjective sense of shortness of breath during exertion;Long Term: Able to use Dyspnea scale to guide intensity level when exercising independently       Knowledge and understanding of Target Heart Rate Range (  THRR) Yes       Intervention Provide education and explanation of THRR including how the numbers were predicted and where they are located for reference       Expected Outcomes Short Term: Able to state/look up THRR;Short Term: Able to use daily as guideline for intensity in rehab;Long Term: Able to use THRR to govern intensity when exercising independently       Able to check pulse independently Yes       Intervention Provide education and demonstration on how to check pulse in carotid and radial arteries.;Review the importance of being able to check your own pulse for safety during independent exercise       Expected Outcomes Short Term: Able to explain why pulse checking is important during independent exercise;Long Term: Able to check pulse independently and accurately       Understanding of Exercise Prescription Yes       Intervention Provide education, explanation, and written materials on patient's individual exercise prescription       Expected Outcomes Short Term: Able to explain program exercise prescription;Long Term: Able to explain home exercise prescription to exercise independently                Exercise Goals Re-Evaluation :  Exercise Goals Re-Evaluation     Row Name 01/26/21 0851 02/01/21 1001           Exercise Goal Re-Evaluation   Exercise Goals Review Increase Physical Activity;Able to understand and use rate of perceived exertion (RPE) scale;Knowledge and understanding of Target Heart Rate Range  (THRR);Understanding of Exercise Prescription;Able to understand and use Dyspnea scale;Increase Strength and Stamina;Able to check pulse independently Increase Strength and Stamina;Increase Physical Activity      Comments Reviewed RPE and dyspnea scales, THR and program prescription with pt today.  Pt voiced understanding and was given a copy of goals to take home. Chistopher left early due to knee pain      Expected Outcomes Short: Use RPE daily to regulate intensity. Long: Follow program prescription in THR. Short: attend consistently Long:  improve overall stamina               Discharge Exercise Prescription (Final Exercise Prescription Changes):  Exercise Prescription Changes - 02/01/21 1000       Response to Exercise   Blood Pressure (Admit) 120/76    Blood Pressure (Exercise) 148/84    Blood Pressure (Exit) 130/76    Heart Rate (Admit) 87 bpm    Heart Rate (Exercise) 89 bpm    Rating of Perceived Exertion (Exercise) 13    Symptoms knee pain - left early      T5 Nustep   Level 3    Minutes 15    METs 2             Nutrition:  Target Goals: Understanding of nutrition guidelines, daily intake of sodium 1500mg , cholesterol 200mg , calories 30% from fat and 7% or less from saturated fats, daily to have 5 or more servings of fruits and vegetables.  Education: All About Nutrition: -Group instruction provided by verbal, written material, interactive activities, discussions, models, and posters to present general guidelines for heart healthy nutrition including fat, fiber, MyPlate, the role of sodium in heart healthy nutrition, utilization of the nutrition label, and utilization of this knowledge for meal planning. Follow up email sent as well. Written material given at graduation. Flowsheet Row Pulmonary Rehab from 01/17/2021 in [facility] Cardiac and Pulmonary Rehab  Education need identified 01/17/21  Biometrics:  Pre Biometrics - 01/17/21 1432       Pre Biometrics    Height 6' (1.829 m)    Weight 242 lb 3.2 oz (109.9 kg)    BMI (Calculated) 32.84    Single Leg Stand 30 seconds              Nutrition Therapy Plan and Nutrition Goals:   Nutrition Assessments:  MEDIFICTS Score Key: ?70 Need to make dietary changes  40-70 Heart Healthy Diet ? 40 Therapeutic Level Cholesterol Diet  Flowsheet Row Pulmonary Rehab from 01/17/2021 in [facility] Cardiac and Pulmonary Rehab  Picture Your Plate Total Score on Admission 80      Picture Your Plate Scores: <50 Unhealthy dietary pattern with much room for improvement. 41-50 Dietary pattern unlikely to meet recommendations for good health and room for improvement. 51-60 More healthful dietary pattern, with some room for improvement.  >60 Healthy dietary pattern, although there may be some specific behaviors that could be improved.   Nutrition Goals Re-Evaluation:   Nutrition Goals Discharge (Final Nutrition Goals Re-Evaluation):   Psychosocial: Target Goals: Acknowledge presence or absence of significant depression and/or stress, maximize coping skills, provide positive support system. Participant is able to verbalize types and ability to use techniques and skills needed for reducing stress and depression.   Education: Stress, Anxiety, and Depression - Group verbal and visual presentation to define topics covered.  Reviews how body is impacted by stress, anxiety, and depression.  Also discusses healthy ways to reduce stress and to treat/manage anxiety and depression.  Written material given at graduation.   Education: Sleep Hygiene -Provides group verbal and written instruction about how sleep can affect your health.  Define sleep hygiene, discuss sleep cycles and impact of sleep habits. Review good sleep hygiene tips.    Initial Review & Psychosocial Screening:  Initial Psych Review & Screening - 01/12/21 1012       Initial Review   Current issues with None Identified      Family Dynamics   Good  Support System? Yes   Family and friends     Barriers   Psychosocial barriers to participate in program There are no identifiable barriers or psychosocial needs.;The patient should benefit from training in stress management and relaxation.      Screening Interventions   Interventions Encouraged to exercise;To provide support and resources with identified psychosocial needs;Provide feedback about the scores to participant    Expected Outcomes Short Term goal: Utilizing psychosocial counselor, staff and physician to assist with identification of specific Stressors or current issues interfering with healing process. Setting desired goal for each stressor or current issue identified.;Long Term Goal: Stressors or current issues are controlled or eliminated.;Short Term goal: Identification and review with participant of any Quality of Life or Depression concerns found by scoring the questionnaire.;Long Term goal: The participant improves quality of Life and PHQ9 Scores as seen by post scores and/or verbalization of changes             Quality of Life Scores:  Quality of Life - 01/17/21 1432       Quality of Life   Select Quality of Life      Quality of Life Scores   Health/Function Pre 25.23 %    Socioeconomic Pre 30 %    Psych/Spiritual Pre 28.29 %    Family Pre 21 %    GLOBAL Pre 26.46 %            Scores of 19 and  below usually indicate a poorer quality of life in these areas.  A difference of  2-3 points is a clinically meaningful difference.  A difference of 2-3 points in the total score of the Quality of Life Index has been associated with significant improvement in overall quality of life, self-image, physical symptoms, and general health in studies assessing change in quality of life.  PHQ-9: Recent Review Flowsheet Data     Depression screen [facility] 2/9 01/17/2021 06/23/2020 01/27/2018 12/01/2017 09/15/2017   Decreased Interest 2 0 0 0 0   Down, Depressed, Hopeless 0 0 0 3 1   PHQ  - 2 Score 2 0 0 3 1   Altered sleeping 2 0 - 3 -   Tired, decreased energy 3 1 - 3 -   Change in appetite 2 0 - 1 -   Feeling bad or failure about yourself  0 0 - 3 -   Trouble concentrating 1 0 - 1 -   Moving slowly or fidgety/restless 0 0 - 0 -   Suicidal thoughts 0 0 - 3 -   PHQ-9 Score 10 1 - 17 -   Difficult doing work/chores Somewhat difficult Not difficult at all - Extremely dIfficult -      Interpretation of Total Score  Total Score Depression Severity:  1-4 = Minimal depression, 5-9 = Mild depression, 10-14 = Moderate depression, 15-19 = Moderately severe depression, 20-27 = Severe depression   Psychosocial Evaluation and Intervention:  Psychosocial Evaluation - 01/12/21 1025       Psychosocial Evaluation & Interventions   Interventions Encouraged to exercise with the program and follow exercise prescription    Comments Jalyn has no barriers to attending the program. His support system his his family and friends. A t the moment he lives with his Dad. He is hoping to get healthier by attending the program. He also want s to lose some weight,especially some of the belly weight.  He is ready to start the program and should do well.    Expected Outcomes STG: Tallan attends all scheduled sessions and the education classes. LTG: Donivan is able to continue the progress he gained while in the program.    Continue Psychosocial Services  Follow up required by staff             Psychosocial Re-Evaluation:   Psychosocial Discharge (Final Psychosocial Re-Evaluation):   Education: Education Goals: Education classes will be provided on a weekly basis, covering required topics. Participant will state understanding/return demonstration of topics presented.  Learning Barriers/Preferences:  Learning Barriers/Preferences - 01/12/21 1014       Learning Barriers/Preferences   Learning Barriers None    Learning Preferences Skilled Demonstration             General  Pulmonary Education Topics:  Infection Prevention: - Provides verbal and written material to individual with discussion of infection control including proper hand washing and proper equipment cleaning during exercise session. Flowsheet Row Pulmonary Rehab from 01/17/2021 in [facility] Cardiac and Pulmonary Rehab  Date 01/17/21  Educator [facility]  Instruction Review Code 1- Verbalizes Understanding       Falls Prevention: - Provides verbal and written material to individual with discussion of falls prevention and safety. Flowsheet Row Pulmonary Rehab from 01/17/2021 in [facility] Cardiac and Pulmonary Rehab  Date 01/12/21  Educator [facility]  Instruction Review Code 1- Verbalizes Understanding       Chronic Lung Disease Review: - Group verbal instruction with posters, models, PowerPoint presentations and videos,  to review new updates, new respiratory medications, new advancements in procedures and treatments. Providing information on websites and "800" numbers for continued self-education. Includes information about supplement oxygen, available portable oxygen systems, continuous and intermittent flow rates, oxygen safety, concentrators, and Medicare reimbursement for oxygen. Explanation of Pulmonary Drugs, including class, frequency, complications, importance of spacers, rinsing mouth after steroid MDI's, and proper cleaning methods for nebulizers. Review of basic lung anatomy and physiology related to function, structure, and complications of lung disease. Review of risk factors. Discussion about methods for diagnosing sleep apnea and types of masks and machines for OSA. Includes a review of the use of types of environmental controls: home humidity, furnaces, filters, dust mite/pet prevention, HEPA vacuums. Discussion about weather changes, air quality and the benefits of nasal washing. Instruction on Warning signs, infection symptoms, calling MD promptly, preventive modes, and value of vaccinations. Review of effective  airway clearance, coughing and/or vibration techniques. Emphasizing that all should Create an Action Plan. Written material given at graduation.   AED/CPR: - Group verbal and written instruction with the use of models to demonstrate the basic use of the AED with the basic ABC's of resuscitation.    Anatomy and Cardiac Procedures: - Group verbal and visual presentation and models provide information about basic cardiac anatomy and function. Reviews the testing methods done to diagnose heart disease and the outcomes of the test results. Describes the treatment choices: Medical Management, Angioplasty, or Coronary Bypass Surgery for treating various heart conditions including Myocardial Infarction, Angina, Valve Disease, and Cardiac Arrhythmias.  Written material given at graduation. Flowsheet Row Pulmonary Rehab from 01/17/2021 in [facility] Cardiac and Pulmonary Rehab  Education need identified 01/17/21       Medication Safety: - Group verbal and visual instruction to review commonly prescribed medications for heart and lung disease. Reviews the medication, class of the drug, and side effects. Includes the steps to properly store meds and maintain the prescription regimen.  Written material given at graduation.   Other: -Provides group and verbal instruction on various topics (see comments)   Knowledge Questionnaire Score:  Knowledge Questionnaire Score - 01/17/21 1433       Knowledge Questionnaire Score   Pre Score Cardiac 22/26 (Angina, nutrition, exericse, PAD) Pulmonary 15/18 (O2 safety)              Core Components/Risk Factors/Patient Goals at Admission:  Personal Goals and Risk Factors at Admission - 01/17/21 1435       Core Components/Risk Factors/Patient Goals on Admission    Weight Management Yes;Weight Loss;Obesity    Intervention Weight Management: Develop a combined nutrition and exercise program designed to reach desired caloric intake, while maintaining appropriate  intake of nutrient and fiber, sodium and fats, and appropriate energy expenditure required for the weight goal.;Weight Management: Provide education and appropriate resources to help participant work on and attain dietary goals.;Weight Management/Obesity: Establish reasonable short term and long term weight goals.;Obesity: Provide education and appropriate resources to help participant work on and attain dietary goals.    Admit Weight 242 lb 3.2 oz (109.9 kg)    Goal Weight: Short Term 235 lb (106.6 kg)    Goal Weight: Long Term 200 lb (90.7 kg)    Expected Outcomes Short Term: Continue to assess and modify interventions until short term weight is achieved;Long Term: Adherence to nutrition and physical activity/exercise program aimed toward attainment of established weight goal;Weight Loss: Understanding of general recommendations for a balanced deficit meal plan, which promotes 1-2 lb weight loss per  week and includes a negative energy balance of [phone] kcal/d;Understanding recommendations for meals to include 15-35% energy as protein, 25-35% energy from fat, 35-60% energy from carbohydrates, less than 200mg  of dietary cholesterol, 20-35 gm of total fiber daily;Understanding of distribution of calorie intake throughout the day with the consumption of 4-5 meals/snacks    Diabetes Yes    Intervention Provide education about signs/symptoms and action to take for hypo/hyperglycemia.;Provide education about proper nutrition, including hydration, and aerobic/resistive exercise prescription along with prescribed medications to achieve blood glucose in normal ranges: Fasting glucose 65-99 mg/dL    Expected Outcomes Short Term: Participant verbalizes understanding of the signs/symptoms and immediate care of hyper/hypoglycemia, proper foot care and importance of medication, aerobic/resistive exercise and nutrition plan for blood glucose control.;Long Term: Attainment of HbA1C < 7%.    Heart Failure Yes     Intervention Provide a combined exercise and nutrition program that is supplemented with education, support and counseling about heart failure. Directed toward relieving symptoms such as shortness of breath, decreased exercise tolerance, and extremity edema.    Expected Outcomes Improve functional capacity of life;Short term: Attendance in program 2-3 days a week with increased exercise capacity. Reported lower sodium intake. Reported increased fruit and vegetable intake. Reports medication compliance.;Short term: Daily weights obtained and reported for increase. Utilizing diuretic protocols set by physician.;Long term: Adoption of self-care skills and reduction of barriers for early signs and symptoms recognition and intervention leading to self-care maintenance.    Hypertension Yes    Intervention Provide education on lifestyle modifcations including regular physical activity/exercise, weight management, moderate sodium restriction and increased consumption of fresh fruit, vegetables, and low fat dairy, alcohol moderation, and smoking cessation.;Monitor prescription use compliance.    Expected Outcomes Short Term: Continued assessment and intervention until BP is < 140/71mm HG in hypertensive participants. < 130/24mm HG in hypertensive participants with diabetes, heart failure or chronic kidney disease.;Long Term: Maintenance of blood pressure at goal levels.    Lipids Yes    Intervention Provide education and support for participant on nutrition & aerobic/resistive exercise along with prescribed medications to achieve LDL 70mg , HDL >40mg .    Expected Outcomes Short Term: Participant states understanding of desired cholesterol values and is compliant with medications prescribed. Participant is following exercise prescription and nutrition guidelines.;Long Term: Cholesterol controlled with medications as prescribed, with individualized exercise RX and with personalized nutrition plan. Value goals: LDL <  70mg , HDL > 40 mg.             Education:Diabetes - Individual verbal and written instruction to review signs/symptoms of diabetes, desired ranges of glucose level fasting, after meals and with exercise. Acknowledge that pre and post exercise glucose checks will be done for 3 sessions at entry of program. Flowsheet Row Pulmonary Rehab from 01/17/2021 in [facility] Cardiac and Pulmonary Rehab  Date 01/17/21  Educator [facility]  Instruction Review Code 1- Verbalizes Understanding       Know Your Numbers and Heart Failure: - Group verbal and visual instruction to discuss disease risk factors for cardiac and pulmonary disease and treatment options.  Reviews associated critical values for Overweight/Obesity, Hypertension, Cholesterol, and Diabetes.  Discusses basics of heart failure: signs/symptoms and treatments.  Introduces Heart Failure Zone chart for action plan for heart failure.  Written material given at graduation.   Core Components/Risk Factors/Patient Goals Review:    Core Components/Risk Factors/Patient Goals at Discharge (Final Review):    ITP Comments:  ITP Comments     Row Name 01/12/21 1024  01/17/21 1412 01/26/21 0851 01/30/21 0757 01/31/21 0716   ITP Comments Virtual orientation call completed today. he has an appointment on Date: 01/17/2021 for EP eval and gym Orientation.  Documentation of diagnosis can be found in [facility] Date: 01/17/2021. Completed 6MWT and gym orientation. Initial ITP created and sent for review to Dr. Emily Filbert, Medical Director. First full day of exercise!  Patient was oriented to gym and equipment including functions, settings, policies, and procedures.  Patient's individual exercise prescription and treatment plan were reviewed.  All starting workloads were established based on the results of the 6 minute walk test done at initial orientation visit.  The plan for exercise progression was also introduced and progression will be customized based on patient's  performance and goals. Pt was unable to follow exercise prescription today due to knee pain exacerbated with exercise. Began with 3/10 knee pain, increasing to 8/10. Pt advised to consult with physician concerning knee pain.  Was able to exercise 34 minutes. 30 Day review completed. Medical Director ITP review done, changes made as directed, and signed approval by Medical Director.    [place] Name 02/01/21 1525 02/28/21 0640 03/08/21 1449       ITP Comments Spoke with patient, any movement with his knee bothers him and he states he is unsure if he will be able to continue rehab. Recommended trying other machines that may work better. Encouraged patient to call doctor and make appointment to get knee checked out. Will place on 1 week hold for patient to find relief and call us again in 1 week to decide if he'd like to continue or discharge. 30 Day review completed. Medical Director ITP review done, changes made as directed, and signed approval by Medical Director. No response from patient after multiple attempts. Will discharge at this time.              Comments: Discharge ITP

## 2021-03-15 IMAGING — DX DG CHEST 1V PORT
1 series · 1 of 1 positions shown · non-contrast
Comparison: 09/05/2019

CLINICAL DATA: Chest pain

EXAM:
PORTABLE CHEST 1 VIEW

[chest ap]
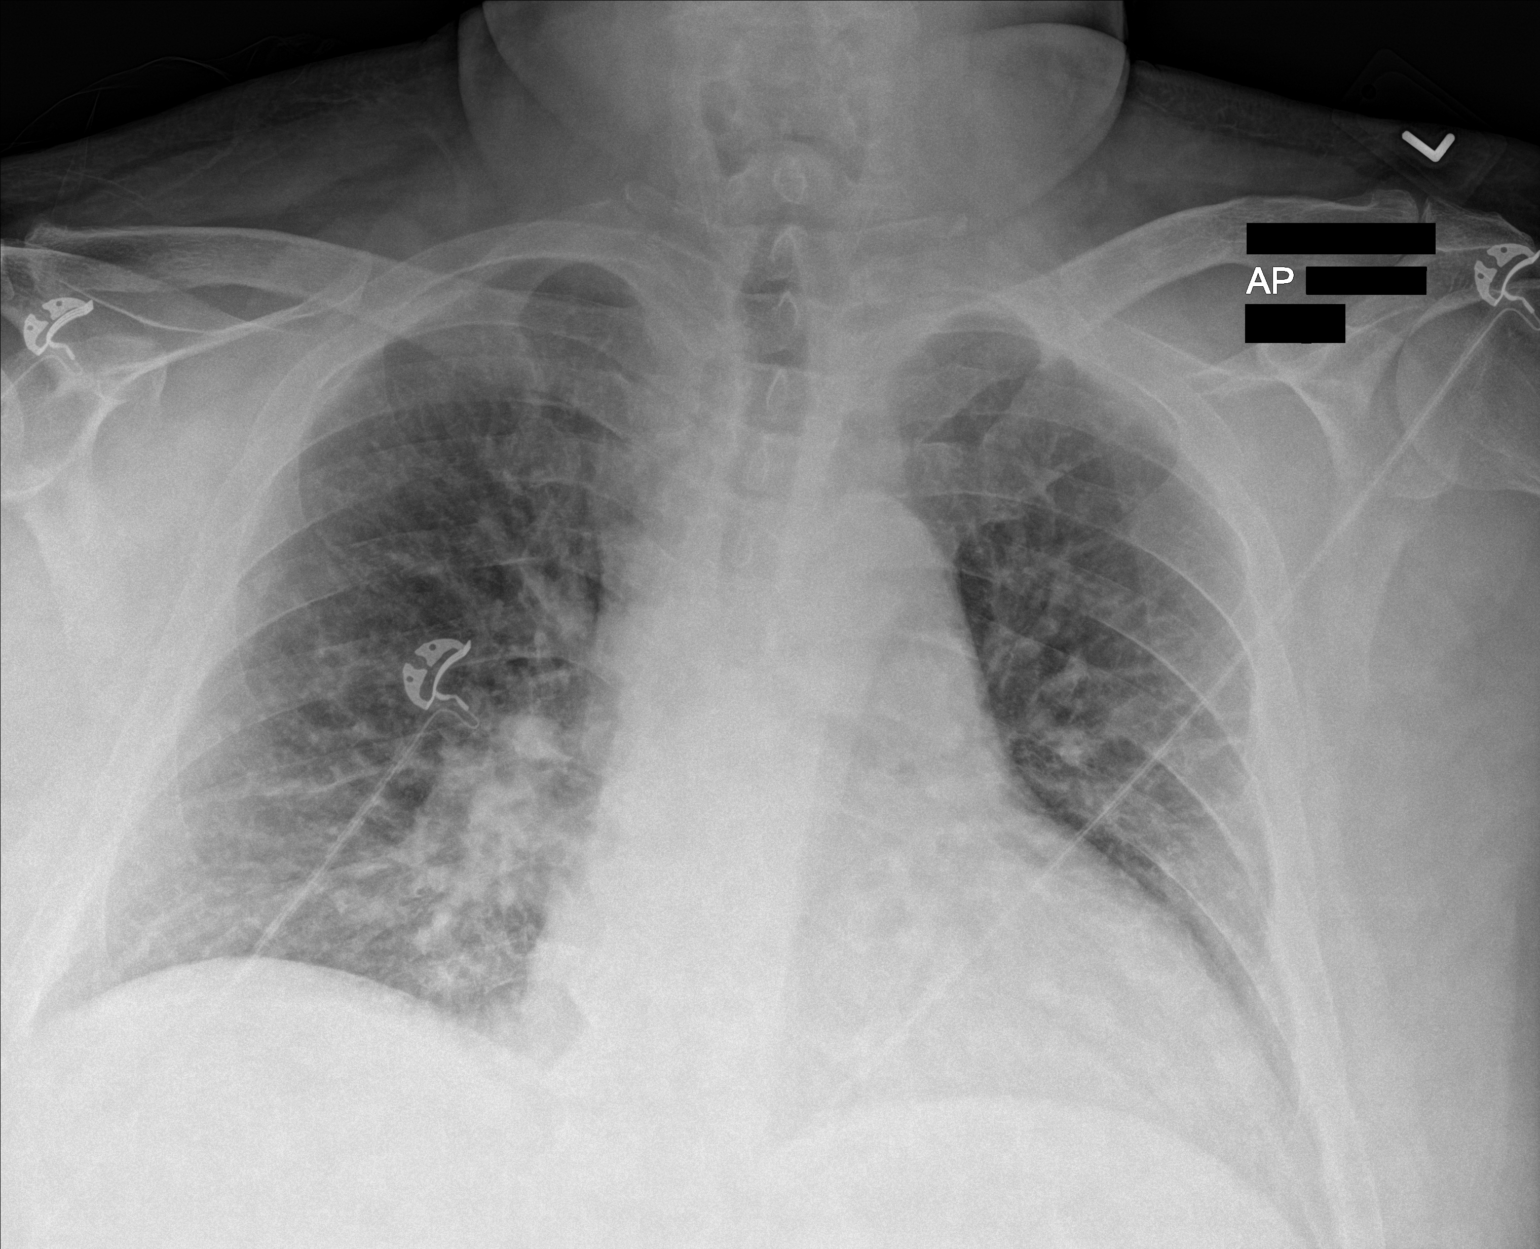

[1 of 1 positions shown; findings below may reference images not displayed]

FINDINGS: Chronic cardiomegaly accentuated by low volumes and portable
technique. Unremarkable mediastinal contours. Diffuse interstitial
prominence with cephalized blood flow. No effusion or pneumothorax.
IMPRESSION: Congestive or bronchitic interstitial opacity, favored congestive.

## 2021-03-28 ENCOUNTER — Other Ambulatory Visit: Payer: Self-pay

## 2021-03-28 ENCOUNTER — Emergency Department: Payer: Medicaid Other

## 2021-03-28 ENCOUNTER — Observation Stay
Admission: EM | Admit: 2021-03-28 | Discharge: 2021-03-29 | Discharge status: Against Medical Advice | Payer: Medicaid Other | Attending: Internal Medicine | Admitting: Internal Medicine

## 2021-03-28 DIAGNOSIS — I2510 Atherosclerotic heart disease of native coronary artery without angina pectoris: Secondary | ICD-10-CM | POA: Diagnosis not present

## 2021-03-28 DIAGNOSIS — E785 Hyperlipidemia, unspecified: Secondary | ICD-10-CM | POA: Diagnosis present

## 2021-03-28 DIAGNOSIS — Z79899 Other long term (current) drug therapy: Secondary | ICD-10-CM | POA: Insufficient documentation

## 2021-03-28 DIAGNOSIS — E669 Obesity, unspecified: Secondary | ICD-10-CM

## 2021-03-28 DIAGNOSIS — R1013 Epigastric pain: Principal | ICD-10-CM | POA: Insufficient documentation

## 2021-03-28 DIAGNOSIS — E1159 Type 2 diabetes mellitus with other circulatory complications: Secondary | ICD-10-CM | POA: Diagnosis not present

## 2021-03-28 DIAGNOSIS — R1011 Right upper quadrant pain: Secondary | ICD-10-CM

## 2021-03-28 DIAGNOSIS — K219 Gastro-esophageal reflux disease without esophagitis: Secondary | ICD-10-CM | POA: Diagnosis present

## 2021-03-28 DIAGNOSIS — I5042 Chronic combined systolic (congestive) and diastolic (congestive) heart failure: Secondary | ICD-10-CM | POA: Diagnosis not present

## 2021-03-28 DIAGNOSIS — I130 Hypertensive heart and chronic kidney disease with heart failure and stage 1 through stage 4 chronic kidney disease, or unspecified chronic kidney disease: Secondary | ICD-10-CM | POA: Diagnosis not present

## 2021-03-28 DIAGNOSIS — G4733 Obstructive sleep apnea (adult) (pediatric): Secondary | ICD-10-CM | POA: Diagnosis present

## 2021-03-28 DIAGNOSIS — N183 Chronic kidney disease, stage 3 unspecified: Secondary | ICD-10-CM | POA: Insufficient documentation

## 2021-03-28 DIAGNOSIS — I152 Hypertension secondary to endocrine disorders: Secondary | ICD-10-CM

## 2021-03-28 DIAGNOSIS — N1831 Chronic kidney disease, stage 3a: Secondary | ICD-10-CM | POA: Diagnosis present

## 2021-03-28 DIAGNOSIS — E1122 Type 2 diabetes mellitus with diabetic chronic kidney disease: Secondary | ICD-10-CM | POA: Diagnosis present

## 2021-03-28 DIAGNOSIS — I10 Essential (primary) hypertension: Secondary | ICD-10-CM | POA: Diagnosis not present

## 2021-03-28 DIAGNOSIS — Z86711 Personal history of pulmonary embolism: Secondary | ICD-10-CM

## 2021-03-28 DIAGNOSIS — Z7901 Long term (current) use of anticoagulants: Secondary | ICD-10-CM

## 2021-03-28 DIAGNOSIS — J449 Chronic obstructive pulmonary disease, unspecified: Secondary | ICD-10-CM | POA: Insufficient documentation

## 2021-03-28 DIAGNOSIS — E1169 Type 2 diabetes mellitus with other specified complication: Secondary | ICD-10-CM

## 2021-03-28 DIAGNOSIS — I255 Ischemic cardiomyopathy: Secondary | ICD-10-CM | POA: Diagnosis present

## 2021-03-28 DIAGNOSIS — E119 Type 2 diabetes mellitus without complications: Secondary | ICD-10-CM

## 2021-03-28 DIAGNOSIS — Z87891 Personal history of nicotine dependence: Secondary | ICD-10-CM | POA: Insufficient documentation

## 2021-03-28 DIAGNOSIS — Z28311 Partially vaccinated for covid-19: Secondary | ICD-10-CM

## 2021-03-28 DIAGNOSIS — Z20822 Contact with and (suspected) exposure to covid-19: Secondary | ICD-10-CM | POA: Insufficient documentation

## 2021-03-28 DIAGNOSIS — Z5329 Procedure and treatment not carried out because of patient's decision for other reasons: Secondary | ICD-10-CM | POA: Diagnosis present

## 2021-03-28 DIAGNOSIS — R079 Chest pain, unspecified: Secondary | ICD-10-CM | POA: Insufficient documentation

## 2021-03-28 DIAGNOSIS — I5022 Chronic systolic (congestive) heart failure: Secondary | ICD-10-CM | POA: Diagnosis present

## 2021-03-28 DIAGNOSIS — Z794 Long term (current) use of insulin: Secondary | ICD-10-CM

## 2021-03-28 DIAGNOSIS — I252 Old myocardial infarction: Secondary | ICD-10-CM

## 2021-03-28 DIAGNOSIS — Z809 Family history of malignant neoplasm, unspecified: Secondary | ICD-10-CM

## 2021-03-28 DIAGNOSIS — Z8249 Family history of ischemic heart disease and other diseases of the circulatory system: Secondary | ICD-10-CM

## 2021-03-28 DIAGNOSIS — Z833 Family history of diabetes mellitus: Secondary | ICD-10-CM

## 2021-03-28 DIAGNOSIS — E1165 Type 2 diabetes mellitus with hyperglycemia: Secondary | ICD-10-CM | POA: Diagnosis not present

## 2021-03-28 DIAGNOSIS — K819 Cholecystitis, unspecified: Secondary | ICD-10-CM | POA: Diagnosis present

## 2021-03-28 DIAGNOSIS — I509 Heart failure, unspecified: Secondary | ICD-10-CM

## 2021-03-28 LAB — TROPONIN I (HIGH SENSITIVITY)
Troponin I (High Sensitivity): 64 ng/L — ABNORMAL HIGH (ref <18)
Troponin I (High Sensitivity): 60 ng/L — ABNORMAL HIGH (ref <18)

## 2021-03-28 LAB — RESP PANEL BY RT-PCR (FLU A&B, COVID) ARPGX2
Influenza A by PCR: NEGATIVE
Influenza B by PCR: NEGATIVE
SARS Coronavirus 2 by RT PCR: NEGATIVE

## 2021-03-28 LAB — HEPATIC FUNCTION PANEL
ALT: 20 U/L (ref 0–44)
AST: 27 U/L (ref 15–41)
Albumin: 3.3 g/dL — ABNORMAL LOW (ref 3.5–5.0)
Alkaline Phosphatase: 59 U/L (ref 38–126)
Bilirubin, Direct: 0.4 mg/dL — ABNORMAL HIGH (ref 0.0–0.2)
Indirect Bilirubin: 1.7 mg/dL — ABNORMAL HIGH (ref 0.3–0.9)
Total Bilirubin: 2.1 mg/dL — ABNORMAL HIGH (ref 0.3–1.2)
Total Protein: 6.3 g/dL — ABNORMAL LOW (ref 6.5–8.1)

## 2021-03-28 LAB — GLUCOSE, CAPILLARY: Glucose-Capillary: 181 mg/dL — ABNORMAL HIGH (ref 70–99)

## 2021-03-28 LAB — CBC
HCT: 47.5 % (ref 39.0–52.0)
Hemoglobin: 16.1 g/dL (ref 13.0–17.0)
MCH: 28.6 pg (ref 26.0–34.0)
MCHC: 33.9 g/dL (ref 30.0–36.0)
MCV: 84.4 fL (ref 80.0–100.0)
Platelets: 214 10*3/uL (ref 150–400)
RBC: 5.63 MIL/uL (ref 4.22–5.81)
RDW: 14.1 % (ref 11.5–15.5)
WBC: 11.1 10*3/uL — ABNORMAL HIGH (ref 4.0–10.5)
nRBC: 0 % (ref 0.0–0.2)

## 2021-03-28 LAB — BASIC METABOLIC PANEL
Anion gap: 6 (ref 5–15)
BUN: 17 mg/dL (ref 6–20)
CO2: 25 mmol/L (ref 22–32)
Calcium: 9 mg/dL (ref 8.9–10.3)
Chloride: 107 mmol/L (ref 98–111)
Creatinine, Ser: 1.19 mg/dL (ref 0.61–1.24)
GFR, Estimated: >60 mL/min (ref >60)
Glucose, Bld: 313 mg/dL — ABNORMAL HIGH (ref 70–99)
Potassium: 3.8 mmol/L (ref 3.5–5.1)
Sodium: 138 mmol/L (ref 135–145)

## 2021-03-28 LAB — LIPASE, BLOOD: Lipase: 29 U/L (ref 11–51)

## 2021-03-28 MED ORDER — ONDANSETRON HCL 4 MG/2ML IJ SOLN
4.0000 mg | Freq: Once | INTRAMUSCULAR | Status: AC
  Administered 2021-03-28: 4 mg via INTRAVENOUS
  Filled 2021-03-28: qty 2

## 2021-03-28 MED ORDER — ONDANSETRON HCL 4 MG/2ML IJ SOLN
4.0000 mg | Freq: Four times a day (QID) | INTRAMUSCULAR | Status: DC | PRN
Start: 2021-03-28 — End: 2021-03-29
  Administered 2021-03-28: 4 mg via INTRAVENOUS
  Filled 2021-03-28: qty 2

## 2021-03-28 MED ORDER — FUROSEMIDE 10 MG/ML IJ SOLN
40.0000 mg | Freq: Once | INTRAMUSCULAR | Status: AC
  Administered 2021-03-28: 40 mg via INTRAVENOUS
  Filled 2021-03-28: qty 4

## 2021-03-28 MED ORDER — PANTOPRAZOLE SODIUM 40 MG IV SOLR
40.0000 mg | Freq: Once | INTRAVENOUS | Status: AC
  Administered 2021-03-28: 40 mg via INTRAVENOUS
  Filled 2021-03-28: qty 40

## 2021-03-28 MED ORDER — IPRATROPIUM-ALBUTEROL 0.5-2.5 (3) MG/3ML IN SOLN: 3.0000 mL | Freq: Four times a day (QID) | RESPIRATORY_TRACT | Status: DC | PRN

## 2021-03-28 MED ORDER — ALUM & MAG HYDROXIDE-SIMETH 200-200-20 MG/5ML PO SUSP
30.0000 mL | Freq: Once | ORAL | Status: AC
  Administered 2021-03-28: 30 mL via ORAL
  Filled 2021-03-28: qty 30

## 2021-03-28 MED ORDER — HYDROMORPHONE HCL 1 MG/ML IJ SOLN
1.0000 mg | INTRAMUSCULAR | Status: AC
  Administered 2021-03-28: 1 mg via INTRAVENOUS
  Filled 2021-03-28: qty 1

## 2021-03-28 MED ORDER — PIPERACILLIN-TAZOBACTAM 3.375 G IVPB
3.3750 g | Freq: Three times a day (TID) | INTRAVENOUS | Status: DC
  Administered 2021-03-28 – 2021-03-29 (×3): 3.375 g via INTRAVENOUS
  Filled 2021-03-28 (×3): qty 50

## 2021-03-28 MED ORDER — HEPARIN SODIUM (PORCINE) 5000 UNIT/ML IJ SOLN: 5000.0000 [IU] | Freq: Three times a day (TID) | INTRAMUSCULAR | Status: DC

## 2021-03-28 MED ORDER — INSULIN GLARGINE-YFGN 100 UNIT/ML ~~LOC~~ SOLN
28.0000 [IU] | Freq: Every day | SUBCUTANEOUS | Status: DC
  Administered 2021-03-28: 28 [IU] via SUBCUTANEOUS
  Filled 2021-03-28 (×2): qty 0.28

## 2021-03-28 MED ORDER — ACETAMINOPHEN 325 MG PO TABS: 650.0000 mg | ORAL_TABLET | Freq: Four times a day (QID) | ORAL | Status: DC | PRN

## 2021-03-28 MED ORDER — KETOROLAC TROMETHAMINE 15 MG/ML IJ SOLN
15.0000 mg | Freq: Four times a day (QID) | INTRAMUSCULAR | Status: DC | PRN
  Administered 2021-03-29: 15 mg via INTRAVENOUS
  Filled 2021-03-28: qty 1

## 2021-03-28 MED ORDER — IOHEXOL 350 MG/ML SOLN
80.0000 mL | Freq: Once | INTRAVENOUS | Status: AC | PRN
  Administered 2021-03-28: 80 mL via INTRAVENOUS
  Filled 2021-03-28: qty 80

## 2021-03-28 MED ORDER — BASAGLAR KWIKPEN 100 UNIT/ML ~~LOC~~ SOPN: 28.0000 [IU] | PEN_INJECTOR | Freq: Every day | SUBCUTANEOUS | Status: DC

## 2021-03-28 MED ORDER — PANTOPRAZOLE SODIUM 40 MG IV SOLR
40.0000 mg | INTRAVENOUS | Status: DC
  Administered 2021-03-29: 40 mg via INTRAVENOUS
  Filled 2021-03-28: qty 40

## 2021-03-28 MED ORDER — KETOROLAC TROMETHAMINE 30 MG/ML IJ SOLN
15.0000 mg | INTRAMUSCULAR | Status: AC
  Administered 2021-03-28: 15 mg via INTRAVENOUS
  Filled 2021-03-28: qty 1

## 2021-03-28 MED ORDER — HYDROMORPHONE HCL 1 MG/ML IJ SOLN
0.5000 mg | INTRAMUSCULAR | Status: AC | PRN
  Administered 2021-03-28: 0.5 mg via INTRAVENOUS
  Filled 2021-03-28: qty 1

## 2021-03-28 MED ORDER — MORPHINE SULFATE (PF) 2 MG/ML IV SOLN
2.0000 mg | INTRAVENOUS | Status: DC | PRN
  Administered 2021-03-29: 2 mg via INTRAVENOUS
  Filled 2021-03-28: qty 1

## 2021-03-28 MED ORDER — PIPERACILLIN-TAZOBACTAM 3.375 G IVPB 30 MIN
3.3750 g | Freq: Once | INTRAVENOUS | Status: AC
  Administered 2021-03-28: 3.375 g via INTRAVENOUS
  Filled 2021-03-28: qty 50

## 2021-03-28 MED ORDER — ONDANSETRON 8 MG PO TBDP
8.0000 mg | ORAL_TABLET | ORAL | Status: DC
  Filled 2021-03-28: qty 1

## 2021-03-28 MED ORDER — MORPHINE SULFATE (PF) 2 MG/ML IV SOLN
2.0000 mg | INTRAVENOUS | Status: DC | PRN
  Administered 2021-03-28: 2 mg via INTRAVENOUS
  Filled 2021-03-28: qty 1

## 2021-03-28 MED ORDER — INSULIN ASPART 100 UNIT/ML IJ SOLN: 0.0000 [IU] | Freq: Every day | INTRAMUSCULAR | Status: DC

## 2021-03-28 MED ORDER — INSULIN ASPART 100 UNIT/ML IJ SOLN
0.0000 [IU] | Freq: Three times a day (TID) | INTRAMUSCULAR | Status: DC
  Administered 2021-03-29 (×2): 3 [IU] via SUBCUTANEOUS
  Filled 2021-03-28 (×2): qty 1

## 2021-03-28 MED ORDER — RIVAROXABAN 20 MG PO TABS
20.0000 mg | ORAL_TABLET | Freq: Every day | ORAL | Status: DC
  Filled 2021-03-28: qty 1

## 2021-03-28 MED ORDER — FAMOTIDINE 20 MG PO TABS
40.0000 mg | ORAL_TABLET | Freq: Once | ORAL | Status: AC
  Administered 2021-03-28: 40 mg via ORAL
  Filled 2021-03-28: qty 2

## 2021-03-28 MED ORDER — HYDROMORPHONE HCL 1 MG/ML IJ SOLN: 0.5000 mg | INTRAMUSCULAR | Status: DC | PRN

## 2021-03-28 MED ORDER — SACUBITRIL-VALSARTAN 49-51 MG PO TABS
0.5000 | ORAL_TABLET | Freq: Two times a day (BID) | ORAL | Status: DC
  Administered 2021-03-28 – 2021-03-29 (×2): 0.5 via ORAL
  Filled 2021-03-28 (×3): qty 0.5

## 2021-03-28 MED ORDER — ONDANSETRON HCL 4 MG PO TABS
4.0000 mg | ORAL_TABLET | Freq: Four times a day (QID) | ORAL | Status: DC | PRN
Start: 2021-03-28 — End: 2021-03-29

## 2021-03-28 MED ORDER — ACETAMINOPHEN 650 MG RE SUPP: 650.0000 mg | Freq: Four times a day (QID) | RECTAL | Status: DC | PRN

## 2021-03-28 MED ORDER — CARVEDILOL 3.125 MG PO TABS
6.2500 mg | ORAL_TABLET | Freq: Two times a day (BID) | ORAL | Status: DC
  Administered 2021-03-28 – 2021-03-29 (×3): 6.25 mg via ORAL
  Filled 2021-03-28: qty 1
  Filled 2021-03-28 (×3): qty 2

## 2021-03-28 MED ORDER — LACTATED RINGERS IV SOLN: INTRAVENOUS | Status: DC

## 2021-03-28 NOTE — ED Triage Notes (Signed)
Pt to ED for mid to upper abd pain "on fire" that started a few days ago.  +shob, labored in triage. Speaking in short sentences.  +n/v/d that started yesterday

## 2021-03-28 NOTE — Progress Notes (Signed)
Appreciate admission to hospitalist service.   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   Full consult to follow.

## 2021-03-28 NOTE — Plan of Care (Signed)

## 2021-03-28 NOTE — H&P (Addendum)
History and Physical   Gabriel [name]. PIR:[id number] DOB: [birth year] DOA: 03/28/2021  PCP: Virginia Crews, MD  Specialist: Dr. Rogue Bussing oncology, [name] cardiology Patient coming from: home   I have personally briefly reviewed patient's old medical records in [place].  Chief Concern: abdominal pain  HPI: Gabriel [name]. is a 50 y.o. male with medical history significant for hypertension, obesity, insulin-dependent diabetes mellitus, GERD, hyperlipidemia,Heart failure with reduced ejection fraction, presents to the emergency department for chief concerns of abdominal pain.  He has been having abdominal pain that has gradually worsen over the last year.  However the pain has gradually worsened over the last week and has become almost unbearable 03/27/2021, prompting him to present to the emergency department for further evaluation.  He endorses subjective fever at home, but he did not take his temperature. He endorses nausea and vomiting, once yesterday and it was yellow.  He denies chest pain, changes to his baseline shortness of breath, dysuria.  He does endorse diarrhea and said that 03/27/2021, he had 4 episodes of loose and watery bowel movements.  He denies blood or melena stool.  He states that the abdominal pain is worse with eating and deep palpitation at the right upper quadrant and right lower lung/rib area.  Social history: He lives with his father. He is a former tobacco user, quit 2021, started at age 27 and at his peak smoked 2.5 ppd. He denies etoh and recreational drug use. He works at [employer].  Vaccination history: He is vaccinated for covid19 with two doses, no booster  ROS: Constitutional: no weight change, + fever ENT/Mouth: no sore throat, no rhinorrhea Eyes: no eye pain, no vision changes Cardiovascular: no chest pain, + dyspnea,  no edema, no palpitations Respiratory: + cough, no sputum, no wheezing Gastrointestinal: + nausea, +  vomiting, no diarrhea, no constipation Genitourinary: no urinary incontinence, no dysuria, no hematuria Musculoskeletal: no arthralgias, + myalgias Skin: no skin lesions, no pruritus, Neuro: + weakness, no loss of consciousness, no syncope Psych: no anxiety, no depression, + decrease appetite Heme/Lymph: no bruising, no bleeding  ED Course: Discussed with emergency medicine provider, patient requiring hospitalization for chief concerns of poorly controlled abdominal pain at this time.  Vitals in the emergency department was remarkable for temperature 98.1, respiration rate of 28, heart rate 89, blood pressure 144/98, SPO2 of 93% on 2 L nasal cannula.  At bedside patient was on room air and satting at 95%.  Labs in the emergency department was remarkable for WBC 11.1, hemoglobin 16.1, platelets 214.  Sodium level 138, potassium 3.8, chloride 107, bicarb 25, BUN 17, serum creatinine of 1.19, nonfasting blood glucose 313.  eGFR greater than 60.  Indirect bilirubin 1.7, T bili 2.1.  Assessment/Plan  Active Problems:   Right upper quadrant abdominal pain   # Right upper quadrant abdominal pain-per ultrasound showed a calculus cholecystitis - Status post Zosyn, Protonix 40 mg IV, Toradol 15 mg IV, Dilaudid 1 mg, IV, Pepcid 40 mg per EDP - Continue Zosyn per pharmacy - At bedside his urine was dark golden yellow and patient appears to be dehydrated I suspect this is secondary to diarrhea and vomiting with loss of appetite from the last week - Lactated ringer 125 mL/h, 1 day ordered - Nuclear medicine HIDA scan ordered - General surgeon, Dr. Christian Mate, has been consulted by EDP and recommends nuclear medicine testing and continue antibiotic - Pain control: Dilaudid 0.5 mg IV every 4 hours.  For severe pain, 1 doses ordered; morphine 2 mg IV every 4 hours as needed for moderate pain, 2 doses ordered-I did not order more opioid pain medication as this affects the function of the gallbladder and  HIDA scan has been ordered and pending, likely will be done tomorrow - Pain control: Ketorolac 15 mg IV every 6 hours as needed for moderate pain, 1 day ordered  # Hypertension-resumed carvedilol 6.25 mg p.o. twice daily, Entresto 0.5 mg p.o. twice daily  # Insulin-dependent diabetes mellitus-resumed home long-acting 28 units nightly, insulin SSI with at bedtime coverage ordered  # OSA-CPAP nightly ordered # GERD-PPI ordered  # COPD/chronic bronchitis-resumed home DuoNebs every 6 hours.  For wheezing  Heart failure reduced ejection fraction-appears euvolemic and compensated at this time  # History of nonsustained V. 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 Rogue Bussing -Patient had acute pulmonary embolism in the right upper lobe/right lower extremity DVTwas, was on Xarelto for 2 months and stopped due to cost and insurance; experienced a right lobar artery pulmonary embolism in October 2022, Xarelto was resumed  Chart reviewed.   DVT prophylaxis: Heparin 5000 units subcutaneous every 8 hours Code Status: Full code Diet: Heart healthy/carb modified Family Communication: No Disposition Plan: Pending clinical course Consults called: None at this time Admission status: MedSurg, observation, telemetry  Past Medical History:  Diagnosis Date   CAD (coronary artery disease)    a. 04/2015 low risk MV;  b. 12/2016 Cath: minor irregs in LAD/Diag/LCX/OM, RCA 40p/m/d; c. 05/2020 Cath: LM nl, LAD 50d, LCX 30p, OM1 40, RCA 100p w/ L->L collats. CO/CI 3.1/1.3-->Med rx.   Chest wall pain, chronic    CHF (congestive heart failure) (HCC)    Chronic Troponin Elevation    CKD (chronic kidney disease), stage II-III    COPD (chronic obstructive pulmonary disease) (HCC)     Diabetes mellitus without complication ([place])    HFrEF (heart failure with reduced ejection fraction) ([place])    Hypertension    Resolved since weight loss   Mixed Ischemic & NICM (nonischemic cardiomyopathy) ([place])    a. 03/2015 Echo: EF 45-50%; b. 12/2015 Echo: EF 20-25%; c. 02/2016 Echo: EF 30-35%; d. 11/2016 Echo: EF 40-45%; e. 06/2019 Echo: EF 30-35%; f. 11/2019 Echo: EF 25-30%; g. 05/2020 Echo: EF 35-40%, glob HK, nl RV size/fxn, PASP 44.39mHg, mod dil LA. Mild to mod MR.   Myocardial infarct ([facility]    NSVT (nonsustained ventricular tachycardia) ([place]    a. 12/2015 noted on tele-->amio;  b. 12/2015 Event monitor: no VT. Atach noted.   Obesity (BMI 30.0-34.9)    Psoriasis    Syncope    a. 01/2016 - felt to be vasovagal.   Tobacco abuse    Past Surgical History:  Procedure Laterality Date   AMPUTATION     CARDIAC CATHETERIZATION     FINGER AMPUTATION     Traumatic   FINGER FRACTURE SURGERY Left    LEFT HEART CATH AND CORONARY ANGIOGRAPHY N/A 01/06/2017   Procedure: Left Heart Cath and Coronary Angiography;  Surgeon: AWellington Hampshire MD;  [facility];  Service: Cardiovascular;  Laterality: N/A;   RIGHT/LEFT HEART CATH AND CORONARY ANGIOGRAPHY N/A 06/13/2020   Procedure: RIGHT/LEFT HEART CATH AND CORONARY ANGIOGRAPHY;  Surgeon: ENelva Bush MD;  [facility];  Service: Cardiovascular;  Laterality: N/A;   Social History:  reports that he quit smoking about 10 months ago. His smoking use included cigarettes. He has a 16.50 pack-year smoking history. He quit smokeless tobacco use about 10 months ago. He reports current alcohol use. He reports that he does not use drugs.  Allergies  Allergen Reactions   Metformin And Related Nausea And Vomiting   Prednisone Other (See Comments)    Reaction: Hallucinations     Zantac [Ranitidine Hcl] Diarrhea and Nausea Only    Night sweats   Family History  Problem Relation Age of Onset   Diabetes Mellitus II Mother     Hypothyroidism Mother    Hypertension Mother    Hypertension Father    Gout Father    Cancer Paternal Aunt    Cancer Maternal Grandfather    Diabetes Maternal Grandfather    Family history: Family history reviewed and not pertinent  Prior to Admission medications   Medication Sig Start Date End Date Taking? Authorizing Provider  carvedilol (COREG) 6.25 MG tablet Take 1 tablet (6.25 mg total) by mouth 2 (two) times daily with a meal. 08/02/20   Larey Dresser, MD  cetirizine (ZYRTEC) 10 MG tablet Take 10 mg by mouth daily.    [provider]  ENTRESTO 49-51 MG Take 1 tablet by mouth at bedtime. 11/30/20   [provider]  fenofibrate (TRICOR) 145 MG tablet Take 1 tablet (145 mg total) by mouth daily. 06/09/20   Lorella Nimrod, MD  HUMALOG KWIKPEN 100 UNIT/ML KwikPen Inject 3 Units into the skin 3 (three) times daily. 11/27/20   [provider]  Insulin Glargine (BASAGLAR KWIKPEN) 100 UNIT/ML Inject 28 Units into the skin at bedtime. 06/09/20   Lorella Nimrod, MD  Ipratropium-Albuterol (COMBIVENT RESPIMAT) 20-100 MCG/ACT AERS respimat Inhale 1 puff into the lungs every 6 (six) hours as needed for wheezing.    [provider]  lidocaine (LIDODERM) 5 % Place 1 patch onto the skin every 12 (twelve) hours. Remove & Discard patch within 12 hours or as directed by MD 02/15/21 02/15/22  [name], MD  nitroGLYCERIN (NITROSTAT) 0.4 MG SL tablet Place 1 tablet (0.4 mg total) under the tongue every 5 (five) minutes x 3 doses as needed for chest pain. 06/09/20   Lorella Nimrod, MD  oxyCODONE-acetaminophen (PERCOCET) 5-325 MG tablet Take 2 tablets by mouth every 6 (six) hours as needed for severe pain. 02/15/21   [name], MD  rivaroxaban (XARELTO) 20 MG TABS tablet Take 1 tablet (20 mg total) by mouth daily with supper. 11/07/20   Cammie Sickle, MD  rosuvastatin (CRESTOR) 20 MG tablet Take 1 tablet (20 mg total) by mouth at bedtime. 06/09/20   Lorella Nimrod, MD  torsemide (DEMADEX) 20 MG tablet Take 1 tablet (20 mg total) by mouth daily. 11/27/20   Dessa Phi, DO  TRULICITY 9.23 RA/0.7MA SOPN INJECT 0.75 MG INTO THE SKIN ONCE A WEEK. 09/28/20   Trinna Post, PA-C   Physical Exam: Vitals:   03/28/21 1545 03/28/21 1600 03/28/21 1719 03/28/21 1730  BP:  (!) 142/92  (!) 145/81  Pulse: 83 81  86  Resp: (!) 32 (!) 21  (!) 31  Temp:   98.7 F (37.1 C)   TempSrc:   Oral   SpO2: 94% 96%  95%  Weight:      Height:       Constitutional: appears older than chronological age, NAD, calm, comfortable Eyes: PERRL, lids  and conjunctivae normal ENMT: Mucous membranes are moist. Posterior pharynx clear of any exudate or lesions. Age-appropriate dentition. Hearing appropriate Neck: normal, supple, no masses, no thyromegaly Respiratory: clear to auscultation bilaterally, no wheezing, no crackles. Normal respiratory effort. No accessory muscle use.  Cardiovascular: Regular rate and rhythm, no murmurs / rubs / gallops. No extremity edema. 2+ pedal pulses. No carotid bruits.  Abdomen: Obese abdomen, + tenderness at the right upper quadrant with deep palpation, no masses palpated, no hepatosplenomegaly. Bowel sounds positive.  Musculoskeletal: no clubbing / cyanosis. No joint deformity upper and lower extremities. Good ROM, no contractures, no atrophy. Normal muscle tone.  Skin: no rashes, lesions, ulcers. No induration Neurologic: Sensation intact. Strength 5/5 in all 4.  Psychiatric: Normal judgment and insight. Alert and oriented x 3. Normal mood.   EKG: independently reviewed, showing sinus rhythm with rate of 91, QTc 504  Chest x-ray on Admission: I personally reviewed and I agree with radiologist reading as below.  DG Chest 2 View  Result Date: 03/28/2021 CLINICAL DATA:  Chest pain EXAM: CHEST - 2 VIEW COMPARISON:  03/01/2021 FINDINGS: Stable cardiomediastinal contours. Low lung volumes with bibasilar atelectasis. Chronically coarsened  interstitial markings bilaterally. No pleural effusion or pneumothorax. IMPRESSION: Low lung volumes with bibasilar atelectasis. Electronically Signed   By: Davina Poke D.O.   On: 03/28/2021 12:18   CT ABDOMEN PELVIS W CONTRAST  Result Date: 03/28/2021 CLINICAL DATA:  Abdominal distension with nausea and vomiting. EXAM: CT ABDOMEN AND PELVIS WITH CONTRAST TECHNIQUE: Multidetector CT imaging of the abdomen and pelvis was performed using the standard protocol following bolus administration of intravenous contrast. CONTRAST:  46m OMNIPAQUE IOHEXOL 350 MG/ML SOLN COMPARISON:  09/30/2017. FINDINGS: Lower chest: Basilar septal thickening with moderate right and small left pleural effusions. Mild dependent atelectasis in the right lower lobe. Heart is enlarged. No pericardial effusion. Hepatobiliary: Liver is enlarged, 21.2 cm. Margin is slightly irregular with left hepatic lobe and caudate hypertrophy. Gallbladder wall edema. No biliary ductal dilatation. Pancreas: Negative. Spleen: Negative. Adrenals/Urinary Tract: Adrenal glands are unremarkable. 1.4 cm fluid density lesion in the lower pole right kidney is indicative of a cyst. Other subcentimeter low-attenuation lesions in the kidneys are too small to definitively characterize. Kidneys are otherwise unremarkable. Ureters are decompressed. Bladder is grossly unremarkable. Stomach/Bowel: Stomach, small bowel, appendix and colon are unremarkable. Vascular/Lymphatic: Atherosclerotic calcification of the aorta. No pathologically enlarged lymph nodes. Reproductive: Prostate is visualized. Other: Small bilateral inguinal hernias contain fat. No free fluid. Mesenteries and peritoneum are unremarkable. Musculoskeletal: Degenerative changes in the spine. Old anterior right rib fracture. No worrisome lytic or sclerotic lesions. IMPRESSION: 1. No acute findings to explain the patient's clinical history. 2. Congestive heart failure. 3. Enlarged liver with cirrhotic  morphology. 4. Gallbladder wall edema, nonspecific. Please correlate for right upper quadrant pain and consider ultrasound, as clinically warranted. 5.  Aortic atherosclerosis (ICD10-I70.0). Electronically Signed   By: MLorin PicketM.D.   On: 03/28/2021 14:43   [place]oreaAbdomen Limited RUQ (LIVER/GB)  Result Date: 03/28/2021 CLINICAL DATA:  Right upper quadrant pain EXAM: ULTRASOUND ABDOMEN LIMITED RIGHT UPPER QUADRANT COMPARISON:  CT abdomen pelvis 03/28/2021. Ultrasound abdomen 02/19/2021 FINDINGS: Gallbladder: Contracted gallbladder with diffuse gallbladder wall thickening measuring 7 mm. Negative sonographic Murphy sign. No gallstones identified. Common bile duct: Diameter: 5 mm Liver: Mild increased echogenicity liver parenchyma without focal liver lesion. Portal vein is patent on color Doppler imaging with normal direction of blood flow towards the liver. Other: None. IMPRESSION: Contracted gallbladder with gallbladder  wall thickening. Negative for gallstones or gallbladder wall thickening. Edema in the pericholecystic fat on CT suggestive of acalculous cholecystitis. No biliary dilatation. Electronically Signed   By: Franchot Gallo M.D.   On: 03/28/2021 17:12    Labs on Admission: I have personally reviewed following labs  CBC: Recent Labs  Lab 03/28/21 1126  WBC 11.1*  HGB 16.1  HCT 47.5  MCV 84.4  PLT 150   Basic Metabolic Panel: Recent Labs  Lab 03/28/21 1126  NA 138  K 3.8  CL 107  CO2 25  GLUCOSE 313*  BUN 17  CREATININE 1.19  CALCIUM 9.0   GFR: Estimated Creatinine Clearance: 93.9 mL/min (by C-G formula based on SCr of 1.19 mg/dL).  Liver Function Tests: Recent Labs  Lab 03/28/21 1324  AST 27  ALT 20  ALKPHOS 59  BILITOT 2.1*  PROT 6.3*  ALBUMIN 3.3*   Recent Labs  Lab 03/28/21 1126  LIPASE 29   Urine analysis:    Component Value Date/Time   COLORURINE STRAW (A) 02/19/2021 0838   APPEARANCEUR CLEAR (A) 02/19/2021 0838   APPEARANCEUR Clear 03/08/2020  0951   LABSPEC 1.009 02/19/2021 0838   LABSPEC 1.019 11/12/2012 1713   PHURINE 5.0 02/19/2021 0838   GLUCOSEU >=500 (A) 02/19/2021 0838   GLUCOSEU Negative 11/12/2012 1713   HGBUR SMALL (A) 02/19/2021 0838   BILIRUBINUR NEGATIVE 02/19/2021 0838   BILIRUBINUR Negative 03/08/2020 0951   BILIRUBINUR Negative 11/12/2012 1713   KETONESUR NEGATIVE 02/19/2021 0838   PROTEINUR 30 (A) 02/19/2021 0838   NITRITE NEGATIVE 02/19/2021 0838   LEUKOCYTESUR NEGATIVE 02/19/2021 0838   LEUKOCYTESUR Negative 11/12/2012 1713   Dr. Tobie Poet [facility]  If 7PM-7AM, please contact overnight-coverage provider If 7AM-7PM, please contact day coverage provider [email or web address]  03/28/2021, 5:52 PM

## 2021-03-28 NOTE — ED Provider Notes (Signed)
[facility] Emergency Department Provider Note  ____________________________________________  Time seen: Approximately 5:25 PM  I have reviewed the triage vital signs and the nursing notes.   HISTORY  Chief Complaint Abdominal Pain    HPI Gabriel [name]. is a 50 y.o. male with a history of CAD CHF CKD COPD diabetes who comes ED complaining of epigastric pain for the past 3 days, worse with eating, no alleviating factors.  Feels like it is hard to breathe.  Also started having nausea and vomiting yesterday, worsened with eating.  Denies fever or chills.  No significant change in bowel habits.  Been compliant with medications including his torsemide for CHF.  Pain is rated as severe and "burning". Denies orthopnea or weight gain.   Past Medical History:  Diagnosis Date   CAD (coronary artery disease)    a. 04/2015 low risk MV;  b. 12/2016 Cath: minor irregs in LAD/Diag/LCX/OM, RCA 40p/m/d; c. 05/2020 Cath: LM nl, LAD 50d, LCX 30p, OM1 40, RCA 100p w/ L->L collats. CO/CI 3.1/1.3-->Med rx.   Chest wall pain, chronic    CHF (congestive heart failure) (HCC)    Chronic Troponin Elevation    CKD (chronic kidney disease), stage II-III    COPD (chronic obstructive pulmonary disease) (HCC)    Diabetes mellitus without complication ([place])    HFrEF (heart failure with reduced ejection fraction) ([place])    Hypertension    Resolved since weight loss   Mixed Ischemic & NICM (nonischemic cardiomyopathy) ([place])    a. 03/2015 Echo: EF 45-50%; b. 12/2015 Echo: EF 20-25%; c. 02/2016 Echo: EF 30-35%; d. 11/2016 Echo: EF 40-45%; e. 06/2019 Echo: EF 30-35%; f. 11/2019 Echo: EF 25-30%; g. 05/2020 Echo: EF 35-40%, glob HK, nl RV size/fxn, PASP 44.67mHg, mod dil LA. Mild to mod MR.   Myocardial infarct ([facility]    NSVT (nonsustained ventricular tachycardia) ([place]    a. 12/2015 noted on tele-->amio;  b. 12/2015 Event monitor: no VT. Atach noted.   Obesity (BMI 30.0-34.9)    Psoriasis     Syncope    a. 01/2016 - felt to be vasovagal.   Tobacco abuse      Patient Active Problem List   Diagnosis Date Noted   AKI (acute kidney injury) ([facility] 11/24/2020   Acute kidney injury ([place] 11/23/2020   OSA on CPAP 11/23/2020   Chest pain 10/27/2020   Snoring 08/15/2020   Type II diabetes mellitus with renal manifestations ([facility] 06/11/2020   COPD (chronic obstructive pulmonary disease) ([place] 06/11/2020   Leukocytosis 06/11/2020   PE (pulmonary thromboembolism) ([place] 06/11/2020   Hyperglycemia    Elevated brain natriuretic peptide (BNP) level    Uncontrolled type 2 diabetes mellitus with hyperglycemia, with long-term current use of insulin ([place] 05/13/2020   Acute on chronic HFrEF (heart failure with reduced ejection fraction) ([place] 05/12/2020   Erectile dysfunction due to arterial insufficiency 03/15/2020   Screening PSA (prostate specific antigen) 03/15/2020   Erectile dysfunction 03/02/2020   Acute pulmonary embolism ([facility] 01/03/2020   Pulmonary embolism on right ([facility]    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[facility] 12/31/2019   Hemoptysis    Costochondritis    Acute respiratory failure with hypoxia ([facility]    Acute on chronic combined systolic (congestive) and diastolic (congestive) heart failure ([place] 12/20/2019   Acute on chronic heart failure ([facility] 11/22/2019   CKD (chronic kidney disease), stage IIIa 06/26/2019   Type 2 diabetes mellitus with hyperlipidemia ([facility] 06/26/2019   Hypertensive urgency 06/26/2019   Acute on chronic  combined systolic and diastolic CHF (congestive heart failure) ([place]) 09/02/2017   Other chest pain 08/24/2017   Dizziness    Noncompliance with medications 04/29/2017   Back pain 03/06/2017   Tobacco use 12/04/2016   Gallbladder sludge 12/04/2016   Acute on chronic systolic CHF (congestive heart failure) ([place]) 11/28/2016   Coronary artery disease, non-occlusive 03/15/2016   Atypical chest pain 02/16/2016   Orthostatic hypotension 01/23/2016   Hypomagnesemia  [id number]   Chronic systolic CHF (congestive heart failure) ([place]) 01/23/2016   NICM (nonischemic cardiomyopathy) ([place]) 01/17/2016   NSVT (nonsustained ventricular tachycardia) (HCC)    Troponin I above reference range    Hyperlipidemia 05/17/2015   COPD exacerbation ([place]) 05/17/2015   Essential hypertension 03/31/2015   Elevated troponin 03/20/2015     Past Surgical History:  Procedure Laterality Date   AMPUTATION     CARDIAC CATHETERIZATION     FINGER AMPUTATION     Traumatic   FINGER FRACTURE SURGERY Left    LEFT HEART CATH AND CORONARY ANGIOGRAPHY N/A 01/06/2017   Procedure: Left Heart Cath and Coronary Angiography;  Surgeon: Wellington Hampshire, MD;  Location: [place] [facility];  Service: Cardiovascular;  Laterality: N/A;   RIGHT/LEFT HEART CATH AND CORONARY ANGIOGRAPHY N/A 06/13/2020   Procedure: RIGHT/LEFT HEART CATH AND CORONARY ANGIOGRAPHY;  Surgeon: Nelva Bush, MD;  Location: [place] [facility];  Service: Cardiovascular;  Laterality: N/A;     Prior to Admission medications   Medication Sig Start Date End Date Taking? Authorizing Provider  carvedilol (COREG) 6.25 MG tablet Take 1 tablet (6.25 mg total) by mouth 2 (two) times daily with a meal. 08/02/20   Larey Dresser, MD  cetirizine (ZYRTEC) 10 MG tablet Take 10 mg by mouth daily.    [provider]  ENTRESTO 49-51 MG Take 1 tablet by mouth at bedtime. 11/30/20   [provider]  fenofibrate (TRICOR) 145 MG tablet Take 1 tablet (145 mg total) by mouth daily. 06/09/20   Lorella Nimrod, MD  HUMALOG KWIKPEN 100 UNIT/ML KwikPen Inject 3 Units into the skin 3 (three) times daily. 11/27/20   [provider]  Insulin Glargine (BASAGLAR KWIKPEN) 100 UNIT/ML Inject 28 Units into the skin at bedtime. 06/09/20   Lorella Nimrod, MD  Ipratropium-Albuterol (COMBIVENT RESPIMAT) 20-100 MCG/ACT AERS respimat Inhale 1 puff into the lungs every 6 (six) hours as needed for wheezing.    [provider]  lidocaine (LIDODERM) 5 % Place 1 patch onto the skin every 12 (twelve) hours. Remove & Discard patch within 12 hours or as directed by MD 02/15/21 02/15/22  [name], MD  nitroGLYCERIN (NITROSTAT) 0.4 MG SL tablet Place 1 tablet (0.4 mg total) under the tongue every 5 (five) minutes x 3 doses as needed for chest pain. 06/09/20   Lorella Nimrod, MD  oxyCODONE-acetaminophen (PERCOCET) 5-325 MG tablet Take 2 tablets by mouth every 6 (six) hours as needed for severe pain. 02/15/21   [name], MD  rivaroxaban (XARELTO) 20 MG TABS tablet Take 1 tablet (20 mg total) by mouth daily with supper. 11/07/20   Cammie Sickle, MD  rosuvastatin (CRESTOR) 20 MG tablet Take 1 tablet (20 mg total) by mouth at bedtime. 06/09/20   Lorella Nimrod, MD  torsemide (DEMADEX) 20 MG tablet Take 1 tablet (20 mg total) by mouth daily. 11/27/20   Dessa Phi, DO  TRULICITY [id number] SOPN INJECT 0.75 MG INTO THE SKIN ONCE A WEEK. 09/28/20   Trinna Post, PA-C  Allergies Metformin and related, Prednisone, and Zantac [ranitidine hcl]   Family History  Problem Relation Age of Onset   Diabetes Mellitus II Mother    Hypothyroidism Mother    Hypertension Mother    Hypertension Father    Gout Father    Cancer Paternal Aunt    Cancer Maternal Grandfather    Diabetes Maternal Grandfather     Social History Social History   Tobacco Use   Smoking status: Former    Packs/day: 0.50    Years: 33.00    Pack years: 16.50    Types: Cigarettes    Quit date: 05/08/2020    Years since quitting: 0.8   Smokeless tobacco: Former    Quit date: 05/08/2020   Tobacco comments:    Quit Sept 2021  Vaping Use   Vaping Use: Former  Substance Use Topics   Alcohol use: Yes    Alcohol/week: 0.0 standard drinks    Comment: occassionally   Drug use: No    Review of Systems  Constitutional:   No fever or chills.  ENT:   No sore throat. No rhinorrhea. Cardiovascular:   No chest pain or  syncope. Respiratory:   No dyspnea or cough. Gastrointestinal:   Positive as above for abdominal pain and vomiting Musculoskeletal:   Negative for focal pain or swelling All other systems reviewed and are negative except as documented above in ROS and HPI.  ____________________________________________   PHYSICAL EXAM:  VITAL SIGNS: ED Triage Vitals  Enc Vitals Group     BP 03/28/21 1124 (!) 144/96     Pulse Rate 03/28/21 1124 89     Resp 03/28/21 1122 (!) 24     Temp 03/28/21 1122 98.1 F (36.7 C)     Temp Source 03/28/21 1719 Oral     SpO2 03/28/21 1124 93 %     Weight 03/28/21 1123 246 lb 14.6 oz (112 kg)     Height 03/28/21 1123 6' (1.829 m)     Head Circumference --      Peak Flow --      Pain Score 03/28/21 1123 10     Pain Loc --      Pain Edu? --      Excl. in [place]? --     Vital signs reviewed, nursing assessments reviewed.   Constitutional:   Alert and oriented. Non-toxic appearance. Eyes:   Conjunctivae are normal. EOMI. PERRL. ENT      Head:   Normocephalic and atraumatic.      Nose:   Wearing a mask.      Mouth/Throat:   Wearing a mask.      Neck:   No meningismus. Full ROM.  No JVD Hematological/Lymphatic/Immunilogical:   No cervical lymphadenopathy. Cardiovascular:   RRR. Symmetric bilateral radial and DP pulses.  No murmurs. Cap refill less than 2 seconds. Respiratory:   Normal respiratory effort without tachypnea/retractions. Breath sounds are clear and equal bilaterally. No wheezes/rales/rhonchi. Gastrointestinal:   Soft with diffuse upper abdominal tenderness. Non distended.  No rebound, rigidity, or guarding. Genitourinary:   deferred Musculoskeletal:   Normal range of motion in all extremities. No joint effusions.  No lower extremity tenderness.  No edema. Neurologic:   Normal speech and language.  Motor grossly intact. No acute focal neurologic deficits are appreciated.  Skin:    Skin is warm, dry and intact. No rash noted.  No petechiae, purpura,  or bullae.  ____________________________________________    LABS (pertinent positives/negatives) (all labs ordered are listed,  but only abnormal results are displayed) Labs Reviewed  BASIC METABOLIC PANEL - Abnormal; Notable for the following components:      Result Value   Glucose, Bld 313 (*)    All other components within normal limits  CBC - Abnormal; Notable for the following components:   WBC 11.1 (*)    All other components within normal limits  HEPATIC FUNCTION PANEL - Abnormal; Notable for the following components:   Total Protein 6.3 (*)    Albumin 3.3 (*)    Total Bilirubin 2.1 (*)    Bilirubin, Direct 0.4 (*)    Indirect Bilirubin 1.7 (*)    All other components within normal limits  TROPONIN I (HIGH SENSITIVITY) - Abnormal; Notable for the following components:   Troponin I (High Sensitivity) 64 (*)    All other components within normal limits  TROPONIN I (HIGH SENSITIVITY) - Abnormal; Notable for the following components:   Troponin I (High Sensitivity) 60 (*)    All other components within normal limits  LIPASE, BLOOD   ____________________________________________   EKG Interpreted by me Normal sinus rhythm rate of 91, right axis, slightly prolonged QTC.  Poor R wave progression.  Normal ST segments and T waves   ____________________________________________    RADIOLOGY  DG Chest 2 View  Result Date: 03/28/2021 CLINICAL DATA:  Chest pain EXAM: CHEST - 2 VIEW COMPARISON:  03/01/2021 FINDINGS: Stable cardiomediastinal contours. Low lung volumes with bibasilar atelectasis. Chronically coarsened interstitial markings bilaterally. No pleural effusion or pneumothorax. IMPRESSION: Low lung volumes with bibasilar atelectasis. Electronically Signed   By: Davina Poke D.O.   On: 03/28/2021 12:18   CT ABDOMEN PELVIS W CONTRAST  Result Date: 03/28/2021 CLINICAL DATA:  Abdominal distension with nausea and vomiting. EXAM: CT ABDOMEN AND PELVIS WITH CONTRAST  TECHNIQUE: Multidetector CT imaging of the abdomen and pelvis was performed using the standard protocol following bolus administration of intravenous contrast. CONTRAST:  60m OMNIPAQUE IOHEXOL 350 MG/ML SOLN COMPARISON:  09/30/2017. FINDINGS: Lower chest: Basilar septal thickening with moderate right and small left pleural effusions. Mild dependent atelectasis in the right lower lobe. Heart is enlarged. No pericardial effusion. Hepatobiliary: Liver is enlarged, 21.2 cm. Margin is slightly irregular with left hepatic lobe and caudate hypertrophy. Gallbladder wall edema. No biliary ductal dilatation. Pancreas: Negative. Spleen: Negative. Adrenals/Urinary Tract: Adrenal glands are unremarkable. 1.4 cm fluid density lesion in the lower pole right kidney is indicative of a cyst. Other subcentimeter low-attenuation lesions in the kidneys are too small to definitively characterize. Kidneys are otherwise unremarkable. Ureters are decompressed. Bladder is grossly unremarkable. Stomach/Bowel: Stomach, small bowel, appendix and colon are unremarkable. Vascular/Lymphatic: Atherosclerotic calcification of the aorta. No pathologically enlarged lymph nodes. Reproductive: Prostate is visualized. Other: Small bilateral inguinal hernias contain fat. No free fluid. Mesenteries and peritoneum are unremarkable. Musculoskeletal: Degenerative changes in the spine. Old anterior right rib fracture. No worrisome lytic or sclerotic lesions. IMPRESSION: 1. No acute findings to explain the patient's clinical history. 2. Congestive heart failure. 3. Enlarged liver with cirrhotic morphology. 4. Gallbladder wall edema, nonspecific. Please correlate for right upper quadrant pain and consider ultrasound, as clinically warranted. 5.  Aortic atherosclerosis (ICD10-I70.0). Electronically Signed   By: MLorin PicketM.D.   On: 03/28/2021 14:43   [place]domen Limited RUQ (LIVER/GB)  Result Date: 03/28/2021 CLINICAL DATA:  Right upper quadrant pain  EXAM: ULTRASOUND ABDOMEN LIMITED RIGHT UPPER QUADRANT COMPARISON:  CT abdomen pelvis 03/28/2021. Ultrasound abdomen 02/19/2021 FINDINGS: Gallbladder: Contracted gallbladder with diffuse gallbladder wall thickening  measuring 7 mm. Negative sonographic Murphy sign. No gallstones identified. Common bile duct: Diameter: 5 mm Liver: Mild increased echogenicity liver parenchyma without focal liver lesion. Portal vein is patent on color Doppler imaging with normal direction of blood flow towards the liver. Other: None. IMPRESSION: Contracted gallbladder with gallbladder wall thickening. Negative for gallstones or gallbladder wall thickening. Edema in the pericholecystic fat on CT suggestive of acalculous cholecystitis. No biliary dilatation. Electronically Signed   By: Franchot Gallo M.D.   On: 03/28/2021 17:12    ____________________________________________   PROCEDURES Procedures  ____________________________________________  DIFFERENTIAL DIAGNOSIS   GERD, pancreatitis, cholecystitis, diverticulitis, bowel obstruction  CLINICAL IMPRESSION / ASSESSMENT AND PLAN / ED COURSE  Medications ordered in the ED: Medications  piperacillin-tazobactam (ZOSYN) IVPB 3.375 g (3.375 g Intravenous New Bag/Given (Non-Interop) 03/28/21 1741)  alum & mag hydroxide-simeth (MAALOX/MYLANTA) 200-200-20 MG/5ML suspension 30 mL (30 mLs Oral Given 03/28/21 1313)  famotidine (PEPCID) tablet 40 mg (40 mg Oral Given 03/28/21 1312)  ondansetron (ZOFRAN) injection 4 mg (4 mg Intravenous Given 03/28/21 1321)  iohexol (OMNIPAQUE) 350 MG/ML injection 80 mL (80 mLs Intravenous Contrast Given 03/28/21 1407)  furosemide (LASIX) injection 40 mg (40 mg Intravenous Given 03/28/21 1558)  pantoprazole (PROTONIX) injection 40 mg (40 mg Intravenous Given 03/28/21 1558)  ketorolac (TORADOL) 30 MG/ML injection 15 mg (15 mg Intravenous Given 03/28/21 1558)  HYDROmorphone (DILAUDID) injection 1 mg (1 mg Intravenous Given 03/28/21 1741)    Pertinent  labs & imaging results that were available during my care of the patient were reviewed by me and considered in my medical decision making (see chart for details).  Rodnie Middendorf. was evaluated in Emergency Department on 03/28/2021 for the symptoms described in the history of present illness. He was evaluated in the context of the global COVID-19 pandemic, which necessitated consideration that the patient might be at risk for infection with the SARS-CoV-2 virus that causes COVID-19. Institutional protocols and algorithms that pertain to the evaluation of patients at risk for COVID-19 are in a state of rapid change based on information released by regulatory bodies including the CDC and federal and state organizations. These policies and algorithms were followed during the patient's care in the ED.   Patient presents with diffuse upper abdominal pain and tenderness, worsening over the last 3 days, now with vomiting in the last 24 hours.  Not septic.  Appears euvolemic without CHF/volume overload.  CT abdomen pelvis obtained which is overall unremarkable except for gallbladder wall edema.  Patient was reexamined and now pain and tenderness are localizing to the right upper quadrant.  Ultrasound was obtained which again shows gallbladder wall thickening but without pericholecystic fluid, negative sonographic Murphy sign, no gallstones.  Discussed with surgery who HIDA scan.  We will start antibiotics for enteric coverage, will contact hospitalist for further management due to patient's multiple medical comorbidities.      ____________________________________________   FINAL CLINICAL IMPRESSION(S) / ED DIAGNOSES    Final diagnoses:  RUQ pain  Chronic congestive heart failure, unspecified heart failure type ([place])  Type 2 diabetes mellitus without complication, with long-term current use of insulin [facility])     ED Discharge Orders     None       Portions of this note were generated with  dragon dictation software. Dictation errors may occur despite best attempts at proofreading.    Carrie Mew, MD 03/28/21 [phone]

## 2021-03-28 NOTE — ED Notes (Signed)
Pt reporting to RN feeling increased shob, labored breathing noted, unable to speak in complete sentences. Repeat vitals obtained. Pt roomed

## 2021-03-28 NOTE — Consult Note (Signed)
Pharmacy Antibiotic Note  Amadeus Schanbacher. is a 50 y.o. male with medical history including HTN, HLD, COPD, NSVT, NICM, CHF, CAD, CKD, diabetes, Hx PE admitted on 03/28/2021 with  abdominal pain .  Pharmacy has been consulted for Zosyn dosing for acute cholecystitis.  Plan:  Zosyn 3.375 g IV q8h (4-hr infusion)  Height: 6' (182.9 cm) Weight: 112 kg (246 lb 14.6 oz) IBW/kg (Calculated) : 77.6  Temp (24hrs), Avg:98.4 F (36.9 C), Min:98.1 F (36.7 C), Max:98.7 F (37.1 C)  Recent Labs  Lab 03/28/21 1126  WBC 11.1*  CREATININE 1.19    Estimated Creatinine Clearance: 93.9 mL/min (by C-G formula based on SCr of 1.19 mg/dL).    Allergies  Allergen Reactions   Metformin And Related Nausea And Vomiting   Prednisone Other (See Comments)    Reaction: Hallucinations     Zantac [Ranitidine Hcl] Diarrhea and Nausea Only    Night sweats    Antimicrobials this admission: Zosyn 8/10 >>   Dose adjustments this admission: N/A  Microbiology results:  Thank you for allowing pharmacy to be a part of this patient's care.  Benita Gutter 03/28/2021 5:57 PM

## 2021-03-28 NOTE — ED Notes (Signed)
Provided pt water per verbal from dr

## 2021-03-28 NOTE — ED Notes (Signed)
Pt using the call bell, states his monitor is going off, monitor reading vtach, pt currently in nsr. Main monitor reviewed and a 3 beat run of vtach noted, dr Joni Fears made aware and visualized reading

## 2021-03-29 ENCOUNTER — Observation Stay: Payer: Medicaid Other

## 2021-03-29 ENCOUNTER — Encounter: Payer: Self-pay | Admitting: Internal Medicine

## 2021-03-29 DIAGNOSIS — Z7901 Long term (current) use of anticoagulants: Secondary | ICD-10-CM | POA: Diagnosis not present

## 2021-03-29 DIAGNOSIS — I252 Old myocardial infarction: Secondary | ICD-10-CM | POA: Diagnosis not present

## 2021-03-29 DIAGNOSIS — Z87891 Personal history of nicotine dependence: Secondary | ICD-10-CM | POA: Diagnosis not present

## 2021-03-29 DIAGNOSIS — K819 Cholecystitis, unspecified: Secondary | ICD-10-CM | POA: Diagnosis not present

## 2021-03-29 DIAGNOSIS — G4733 Obstructive sleep apnea (adult) (pediatric): Secondary | ICD-10-CM | POA: Diagnosis present

## 2021-03-29 DIAGNOSIS — E1122 Type 2 diabetes mellitus with diabetic chronic kidney disease: Secondary | ICD-10-CM | POA: Diagnosis present

## 2021-03-29 DIAGNOSIS — J449 Chronic obstructive pulmonary disease, unspecified: Secondary | ICD-10-CM | POA: Diagnosis present

## 2021-03-29 DIAGNOSIS — I130 Hypertensive heart and chronic kidney disease with heart failure and stage 1 through stage 4 chronic kidney disease, or unspecified chronic kidney disease: Secondary | ICD-10-CM | POA: Diagnosis present

## 2021-03-29 DIAGNOSIS — I255 Ischemic cardiomyopathy: Secondary | ICD-10-CM | POA: Diagnosis present

## 2021-03-29 DIAGNOSIS — N1831 Chronic kidney disease, stage 3a: Secondary | ICD-10-CM | POA: Diagnosis present

## 2021-03-29 DIAGNOSIS — Z86711 Personal history of pulmonary embolism: Secondary | ICD-10-CM | POA: Diagnosis not present

## 2021-03-29 DIAGNOSIS — Z20822 Contact with and (suspected) exposure to covid-19: Secondary | ICD-10-CM | POA: Diagnosis present

## 2021-03-29 DIAGNOSIS — K219 Gastro-esophageal reflux disease without esophagitis: Secondary | ICD-10-CM | POA: Diagnosis present

## 2021-03-29 DIAGNOSIS — Z833 Family history of diabetes mellitus: Secondary | ICD-10-CM | POA: Diagnosis not present

## 2021-03-29 DIAGNOSIS — R1011 Right upper quadrant pain: Secondary | ICD-10-CM | POA: Diagnosis present

## 2021-03-29 DIAGNOSIS — I5022 Chronic systolic (congestive) heart failure: Secondary | ICD-10-CM | POA: Diagnosis present

## 2021-03-29 DIAGNOSIS — E785 Hyperlipidemia, unspecified: Secondary | ICD-10-CM | POA: Diagnosis present

## 2021-03-29 DIAGNOSIS — Z809 Family history of malignant neoplasm, unspecified: Secondary | ICD-10-CM | POA: Diagnosis not present

## 2021-03-29 DIAGNOSIS — Z794 Long term (current) use of insulin: Secondary | ICD-10-CM | POA: Diagnosis not present

## 2021-03-29 DIAGNOSIS — Z28311 Partially vaccinated for covid-19: Secondary | ICD-10-CM | POA: Diagnosis not present

## 2021-03-29 DIAGNOSIS — Z8249 Family history of ischemic heart disease and other diseases of the circulatory system: Secondary | ICD-10-CM | POA: Diagnosis not present

## 2021-03-29 DIAGNOSIS — I2510 Atherosclerotic heart disease of native coronary artery without angina pectoris: Secondary | ICD-10-CM | POA: Diagnosis present

## 2021-03-29 DIAGNOSIS — Z5329 Procedure and treatment not carried out because of patient's decision for other reasons: Secondary | ICD-10-CM | POA: Diagnosis present

## 2021-03-29 LAB — BASIC METABOLIC PANEL
Anion gap: 4 — ABNORMAL LOW (ref 5–15)
BUN: 27 mg/dL — ABNORMAL HIGH (ref 6–20)
CO2: 31 mmol/L (ref 22–32)
Calcium: 8.7 mg/dL — ABNORMAL LOW (ref 8.9–10.3)
Chloride: 106 mmol/L (ref 98–111)
Creatinine, Ser: 1.56 mg/dL — ABNORMAL HIGH (ref 0.61–1.24)
GFR, Estimated: 53 mL/min — ABNORMAL LOW (ref >60)
Glucose, Bld: 227 mg/dL — ABNORMAL HIGH (ref 70–99)
Potassium: 4.7 mmol/L (ref 3.5–5.1)
Sodium: 141 mmol/L (ref 135–145)

## 2021-03-29 LAB — CBC
HCT: 46.1 % (ref 39.0–52.0)
Hemoglobin: 15 g/dL (ref 13.0–17.0)
MCH: 28.3 pg (ref 26.0–34.0)
MCHC: 32.5 g/dL (ref 30.0–36.0)
MCV: 87 fL (ref 80.0–100.0)
Platelets: 212 10*3/uL (ref 150–400)
RBC: 5.3 MIL/uL (ref 4.22–5.81)
RDW: 14.2 % (ref 11.5–15.5)
WBC: 8.5 10*3/uL (ref 4.0–10.5)
nRBC: 0 % (ref 0.0–0.2)

## 2021-03-29 LAB — HEPATIC FUNCTION PANEL
ALT: 17 U/L (ref 0–44)
AST: 14 U/L — ABNORMAL LOW (ref 15–41)
Albumin: 3.2 g/dL — ABNORMAL LOW (ref 3.5–5.0)
Alkaline Phosphatase: 56 U/L (ref 38–126)
Bilirubin, Direct: 0.2 mg/dL (ref 0.0–0.2)
Indirect Bilirubin: 1.2 mg/dL — ABNORMAL HIGH (ref 0.3–0.9)
Total Bilirubin: 1.4 mg/dL — ABNORMAL HIGH (ref 0.3–1.2)
Total Protein: 6.1 g/dL — ABNORMAL LOW (ref 6.5–8.1)

## 2021-03-29 LAB — HEMOGLOBIN A1C
Hgb A1c MFr Bld: 8.1 % — ABNORMAL HIGH (ref 4.8–5.6)
Mean Plasma Glucose: 185.77 mg/dL

## 2021-03-29 LAB — GLUCOSE, CAPILLARY
Glucose-Capillary: 198 mg/dL — ABNORMAL HIGH (ref 70–99)
Glucose-Capillary: 156 mg/dL — ABNORMAL HIGH (ref 70–99)

## 2021-03-29 MED ORDER — OXYCODONE HCL 5 MG PO TABS: 5.0000 mg | ORAL_TABLET | ORAL | Status: DC | PRN

## 2021-03-29 MED ORDER — TECHNETIUM TC 99M MEBROFENIN IV KIT
5.0000 | PACK | Freq: Once | INTRAVENOUS | Status: AC | PRN
  Administered 2021-03-29: 5.34 via INTRAVENOUS

## 2021-03-29 MED ORDER — MORPHINE SULFATE (PF) 2 MG/ML IV SOLN
2.0000 mg | INTRAVENOUS | Status: DC | PRN
Start: 2021-03-29 — End: 2021-03-29
  Administered 2021-03-29: 2 mg via INTRAVENOUS
  Filled 2021-03-29: qty 1

## 2021-03-29 MED ORDER — TORSEMIDE 20 MG PO TABS
20.0000 mg | ORAL_TABLET | Freq: Every day | ORAL | Status: DC
  Administered 2021-03-29: 20 mg via ORAL
  Filled 2021-03-29: qty 1

## 2021-03-29 NOTE — Consult Note (Signed)
[facility] SURGICAL CONSULTATION NOTE (initial) - [id number]  HISTORY OF PRESENT ILLNESS (HPI):  50 y.o. [name] presented to [facility] ED yesterday for evaluation of abdominal pain. Patient reports around a 3 day history of upper abdominal pain which has been relatively constant since the onset but waxes and wanes in intensity. He described this as a sharp and stabbing pain. Nothing seems to make this better or worse. He has gotten no relief at home. He did have a few episodes of isolated nausea and emesis with this. He reports a history of similar pains in the past but these were less severe and resolved spontaneously. He has had his gallbladder evaluated in the past but these work ups have always been unremarkable. Of note, he does have a history of CHF with last echocardiogram in October 2021 showing EF of 35-40%, CAD on xarelto but has "has not taken this in 1 week as he has ran out." Work up in the ED showed a very mild leukocytosis to 11.1K (now resolved at 8.5), slight bump in sCr to 1.56 from baseline (~1.0-1.2), hyperbilirubinemia to 2.1 although this is mostly indirect (1.7). CT Abdomen/Pelvis was obtained and concerning for pericholecystic stranding around a contracted gallbladder, no stones. RUQ [place] was concerning for similar. He was ultimately admitted to the medicine service and started on Zosyn.   Surgery is consulted by emergency medicine physician Dr. 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   PAST MEDICAL HISTORY (PMH):  Past Medical History:  Diagnosis Date   CAD (coronary artery disease)    a. 04/2015 low risk MV;  b. 12/2016 Cath: minor irregs in LAD/Diag/LCX/OM, RCA 40p/m/d; c. 05/2020 Cath: LM nl, LAD 50d, LCX 30p, OM1 40, RCA 100p w/ L->L collats. CO/CI 3.1/1.3-->Med rx.   Chest wall pain, chronic    CHF (congestive heart failure) (HCC)    Chronic Troponin Elevation    CKD (chronic kidney disease), stage  II-III    COPD (chronic obstructive pulmonary disease) (HCC)    Diabetes mellitus without complication ([place])    HFrEF (heart failure with reduced ejection fraction) ([place])    Hypertension    Resolved since weight loss   Mixed Ischemic & NICM (nonischemic cardiomyopathy) ([place])    a. 03/2015 Echo: EF 45-50%; b. 12/2015 Echo: EF 20-25%; c. 02/2016 Echo: EF 30-35%; d. 11/2016 Echo: EF 40-45%; e. 06/2019 Echo: EF 30-35%; f. 11/2019 Echo: EF 25-30%; g. 05/2020 Echo: EF 35-40%, glob HK, nl RV size/fxn, PASP 44.8mHg, mod dil LA. Mild to mod MR.   Myocardial infarct ([facility]    NS[place] ([place]ustained [place]cardia) [place]    a. 12/2015 noted on tele-->amio;  b. 12/2015 Event monitor: no VT. Atach noted.   Obesity (BMI 30.0-34.9)    Psoriasis    Syncope    a. 01/2016 - felt to be vasovagal.   Tobacco abuse      PAST SURGICAL HISTORY ([place]:  Past Surgical History:  Procedure Laterality Date   AMPUTATION     CARDIAC CATHETERIZATION     FINGER AMPUTATION     Traumatic   FINGER FRACTURE SURGERY Left    LEFT HEART CATH AND CORONARY ANGIOGRAPHY N/A 01/06/2017   Procedure: Left Heart Cath and Coronary Angiography;  Surgeon: AWellington Hampshire MD;  [facility];  Service: Cardiovascular;  Laterality: N/A;   RIGHT/LEFT HEART CATH AND CORONARY ANGIOGRAPHY N/A 06/13/2020   Procedure: RIGHT/LEFT HEART CATH AND CORONARY ANGIOGRAPHY;  Surgeon: End,  Harrell Gave, MD;  Location: [facility];  Service: Cardiovascular;  Laterality: N/A;     MEDICATIONS:  Prior to Admission medications   Medication Sig Start Date End Date Taking? Authorizing Provider  carvedilol (COREG) 6.25 MG tablet Take 1 tablet (6.25 mg total) by mouth 2 (two) times daily with a meal. 08/02/20  Yes Larey Dresser, MD  cetirizine (ZYRTEC) 10 MG tablet Take 10 mg by mouth daily.   Yes [provider]  fenofibrate (TRICOR) 145 MG tablet Take 1 tablet (145 mg total) by mouth daily. 06/09/20  Yes Lorella Nimrod,  MD  Insulin Glargine (BASAGLAR KWIKPEN) 100 UNIT/ML Inject 28 Units into the skin at bedtime. 06/09/20  Yes Lorella Nimrod, MD  insulin lispro (HUMALOG) 100 UNIT/ML KwikPen Inject 3 Units into the skin 3 (three) times daily.   Yes [provider]  Ipratropium-Albuterol (COMBIVENT RESPIMAT) 20-100 MCG/ACT AERS respimat Inhale 1 puff into the lungs every 6 (six) hours as needed for wheezing.   Yes [provider]  nitroGLYCERIN (NITROSTAT) 0.4 MG SL tablet Place 1 tablet (0.4 mg total) under the tongue every 5 (five) minutes x 3 doses as needed for chest pain. 06/09/20  Yes Lorella Nimrod, MD  rivaroxaban (XARELTO) 20 MG TABS tablet Take 1 tablet (20 mg total) by mouth daily with supper. 11/07/20  Yes Cammie Sickle, MD  rosuvastatin (CRESTOR) 20 MG tablet Take 1 tablet (20 mg total) by mouth at bedtime. 06/09/20  Yes Lorella Nimrod, MD  sacubitril-valsartan (ENTRESTO) 49-51 MG Take 0.5 tablets by mouth 2 (two) times daily.   Yes [provider]  torsemide (DEMADEX) 20 MG tablet Take 1 tablet (20 mg total) by mouth daily. 11/27/20  Yes Dessa Phi, DO  lidocaine (LIDODERM) 5 % Place 1 patch onto the skin every 12 (twelve) hours. Remove & Discard patch within 12 hours or as directed by MD Patient not taking: Reported on 03/28/2021 02/15/21 02/15/22  [name], MD  oxyCODONE-acetaminophen (PERCOCET) 5-325 MG tablet Take 2 tablets by mouth every 6 (six) hours as needed for severe pain. 02/15/21   [name], MD  TRULICITY [id number] SOPN INJECT 0.75 MG INTO THE SKIN ONCE A WEEK. Patient not taking: No sig reported 09/28/20   Trinna Post, PA-C     ALLERGIES:  Allergies  Allergen Reactions   Metformin And Related Nausea And Vomiting   Prednisone Other (See Comments)    Reaction: Hallucinations     Zantac [Ranitidine Hcl] Diarrhea and Nausea Only    Night sweats     SOCIAL HISTORY:  Social History   Socioeconomic History   Marital status: Widowed     Spouse name: Not on file   Number of children: 1   Years of education: 35   Highest education level: 12th grade  Occupational History   Occupation: disabled  Tobacco Use   Smoking status: Former    Packs/day: 0.50    Years: 33.00    Pack years: 16.50    Types: Cigarettes    Quit date: 05/08/2020    Years since quitting: 0.8   Smokeless tobacco: Former    Quit date: 05/08/2020   Tobacco comments:    Quit Sept 2021  Vaping Use   Vaping Use: Former  Substance and Sexual Activity   Alcohol use: Yes    Alcohol/week: 0.0 standard drinks    Comment: occassionally   Drug use: No   Sexual activity: Not Currently  Other Topics Concern   Not on file  Social History Narrative   Lives at home with wife and his dad's wife.        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    Social Determinants of Health   Financial Resource Strain: Not on file  Food Insecurity: Not on file  Transportation Needs: Not on file  Physical Activity: Not on file  Stress: Not on file  Social Connections: Not on file  Intimate Partner Violence: Not on file     FAMILY HISTORY:  Family History  Problem Relation Age of Onset   Diabetes Mellitus II Mother    Hypothyroidism Mother    Hypertension Mother    Hypertension Father    Gout Father    Cancer Paternal Aunt    Cancer Maternal Grandfather    Diabetes Maternal Grandfather       REVIEW OF SYSTEMS:  Review of Systems  Constitutional:  Negative for chills and fever.  HENT:  Negative for congestion and sore throat.   Respiratory:  Negative for cough and shortness of breath.   Cardiovascular:  Negative for chest pain and palpitations.  Gastrointestinal:  Positive for abdominal pain, nausea and vomiting. Negative for constipation and diarrhea.  All other systems reviewed and are negative.  VITAL SIGNS:  Temp:  [97.7 F (36.5 C)-98.7 F (37.1 C)] 98 F (36.7 C) (08/11 0503) Pulse  Rate:  [70-89] 70 (08/11 0503) Resp:  [17-32] 19 (08/11 0503) BP: (110-151)/(71-97) 116/81 (08/11 0503) SpO2:  [93 %-98 %] 93 % (08/11 0503) Weight:  AL:[id number] kg] 112 kg (08/10 1123)     Height: 6' (182.9 cm) Weight: 112 kg BMI (Calculated): 33.48   INTAKE/OUTPUT:  08/10 0701 - 08/11 0700 In: 50 [IV Piggyback:50] Out: 200 [Urine:200]  PHYSICAL EXAM:  Physical Exam Vitals and nursing note reviewed. Exam conducted with a chaperone present.  Constitutional:      General: He is not in acute distress.    Appearance: He is well-developed. He is obese. He is not ill-appearing.  HENT:     Head: Normocephalic and atraumatic.  Eyes:     General: No scleral icterus.    Extraocular Movements: Extraocular movements intact.  Cardiovascular:     Rate and Rhythm: Normal rate.     Heart sounds: Normal heart sounds.  Pulmonary:     Effort: Pulmonary effort is normal. No respiratory distress.  Abdominal:     General: Abdomen is protuberant. There is no distension.     Palpations: Abdomen is soft.     Tenderness: There is abdominal tenderness in the right upper quadrant and epigastric area. There is no guarding or rebound. Positive signs include Murphy's sign.     Comments: Abdomen is protuberant consistent with degree of obesity, soft, significant RUQ and epigastric tenderness, certainly with positive Murphy's Sign, non-distended  Genitourinary:    Comments: Deferred Skin:    General: Skin is warm and dry.     Coloration: Skin is not jaundiced or pale.  Neurological:     General: No focal deficit present.     Mental Status: He is alert and oriented to person, place, and time.  Psychiatric:        Mood and Affect: Mood normal.        Behavior: Behavior normal.     Labs:  CBC Latest Ref Rng & Units 03/29/2021 03/28/2021 03/01/2021  WBC 4.0 - 10.5 K/uL 8.5 11.1(H) 8.6  Hemoglobin 13.0 - 17.0 g/dL 15.0 16.1  14.6  Hematocrit 39.0 - 52.0 % 46.1 47.5 43.9  Platelets 150 - 400 K/uL 212 214 262    CMP Latest Ref Rng & Units 03/29/2021 03/28/2021 03/01/2021  Glucose 70 - 99 mg/dL 227(H) 313(H) 202(H)  BUN 6 - 20 mg/dL 27(H) 17 18  Creatinine 0.61 - 1.24 mg/dL 1.56(H) 1.19 0.87  Sodium 135 - 145 mmol/L 141 138 139  Potassium 3.5 - 5.1 mmol/L 4.7 3.8 4.3  Chloride 98 - 111 mmol/L 106 107 110  CO2 22 - 32 mmol/L '31 25 22  '$ Calcium 8.9 - 10.3 mg/dL 8.7(L) 9.0 8.8(L)  Total Protein 6.5 - 8.1 g/dL 6.1(L) 6.3(L) -  Total Bilirubin 0.3 - 1.2 mg/dL 1.4(H) 2.1(H) -  Alkaline Phos 38 - 126 U/L 56 59 -  AST 15 - 41 U/L 14(L) 27 -  ALT 0 - 44 U/L 17 20 -     Imaging studies:   CT Abdomen/Pelvis (03/28/2021) personally reviewed which shows pericholecystic stranding around contracted gallbladder which is new from previous images, no cholelithiasis, and radiologist report reviewed:  IMPRESSION: 1. No acute findings to explain the patient's clinical history. 2. Congestive heart failure. 3. Enlarged liver with cirrhotic morphology. 4. Gallbladder wall edema, nonspecific. Please correlate for right upper quadrant pain and consider ultrasound, as clinically warranted. 5.  Aortic atherosclerosis (ICD10-I70.0).    RUQ [place] (03/28/2021) personally reviewed which shows thickening of gallbladder, again no stones, but this appears significantly changed against previous US on 07/04, and radiologist report reviewed below:  IMPRESSION: Contracted gallbladder with gallbladder wall thickening. Negative for gallstones or gallbladder wall thickening. Edema in the pericholecystic fat on CT suggestive of acalculous cholecystitis. No biliary dilatation.    Assessment/Plan: (ICD-10's: K81.9) 50 y.o. 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   - His presentation, exam findings, and  imaging does appear consistent with acalculous cholecystis. I think it is reasonable to forgo HIDA at this time. He is not an optimal surgical candidate ; however, his gallbladder is very contracted on imaging and I certainly do not think this would be amenable to percutaneous drainage. We will tentatively post him for robotic assisted laparoscopic cholecystectomy tomorrow (08/12) with Dr Christian Mate pending OR/Anesthesia availability  - Okay for CLD today if tolerated; NPO at midnight - Continue IV Abx (Zosyn)   - Monitor abdominal examination; on-going bowel function - Pain control prn; antiemetics prn   - Hold anticoagulation in anticipation of surgery  - Further management per primary service; we will follow   All of the above findings and recommendations were discussed with the patient, and all of patient's questions were answered to his expressed satisfaction.  Thank you for the opportunity to participate in this patient's care.   -- Edison Simon, PA-C [facility] 03/29/2021, 7:48 AM [phone] M-F: 7am - 4pm

## 2021-03-29 NOTE — Progress Notes (Signed)
Pt left AMA at this time, education attempted.

## 2021-03-29 NOTE — Progress Notes (Signed)
PROGRESS NOTE    Gabriel [name].  ST:[id number] DOB: 10/21/1970 DOA: 03/28/2021 PCP: Virginia Crews, MD   Brief Narrative:  50 y.o. male with medical history significant for hypertension, obesity, insulin-dependent diabetes mellitus, GERD, hyperlipidemia,Heart failure with reduced ejection fraction, presents to the emergency department for chief concerns of abdominal pain.   He has been having abdominal pain that has gradually worsen over the last year.  However the pain has gradually worsened over the last week and has become almost unbearable 03/27/2021, prompting him to present to the emergency department for further evaluation.   He endorses subjective fever at home, but he did not take his temperature. He endorses nausea and vomiting, once yesterday and it was yellow.  He denies chest pain, changes to his baseline shortness of breath, dysuria.  He does endorse diarrhea and said that 03/27/2021, he had 4 episodes of loose and watery bowel movements.  He denies blood or melena stool.   He states that the abdominal pain is worse with eating and deep palpitation at the right upper quadrant and right lower lung/rib area.  Initial care plan included HIDA scan.  Patient was seen in consultation by general surgery who feel that presentation is consistent with acalculus cholecystitis.  Plan for surgical intervention 8/12.  No indication for HIDA scan at this time.     Assessment & Plan:   Active Problems:   Right upper quadrant abdominal pain  Acalculus cholecystitis Gallbladder wall thickening noted on abdominal ultrasound Exam consistent with above diagnosis General surgery on consult Plan: Clear liquid diet today N.p.o. after midnight Cholecystectomy plan 8/12 Continue Zosyn Continue IV fluids Hold home Xarelto As needed pain control  Essential hypertension PTA Coreg 6.25 mg twice daily Entresto 25 mg p.o. twice daily  Insulin-dependent diabetes mellitus Semglee 28  units nightly Sliding-scale coverage Carb modified diet  Chronic systolic heart failure Mixed ischemic and nonischemic cardiomyopathy Appears euvolemic and compensated Continue home Entresto and beta-blocker Holding Xarelto  History of unprovoked right PE/DVT On Xarelto, follows with Dr. Rogue Bussing Per patient has not taken Xarelto 1 week Xarelto currently on hold for surgical planning  OSA Nightly CPAP  GERD PPI  History of NSVT Cardiac monitoring x24 hours    DVT prophylaxis: SCDs Code Status: Full Family Communication: None today Disposition Plan: Status is: Observation  The patient will require care spanning > 2 midnights and should be moved to inpatient because: Inpatient level of care appropriate due to severity of illness  Dispo: The patient is from: Home              Anticipated d/c is to: Home              Patient currently is not medically stable to d/c.   Difficult to place patient No  Acute acalculous cholecystitis.  Plan for laparoscopic cholecystectomy 8/12.     Level of care: Med-Surg  Consultants:  General surgery  Procedures:  Cholecystectomy plan 8/12  Antimicrobials:  Zosyn   Subjective: Seen and examined.  Endorses pain in right upper quadrant.  No other complaints  Objective: Vitals:   03/29/21 0035 03/29/21 0140 03/29/21 0503 03/29/21 0800  BP: 129/82 110/71 116/81 126/88  Pulse: 82 71 70 72  Resp: (!) '29 20 19 17  '$ Temp: 97.7 F (36.5 C) 97.8 F (36.6 C) 98 F (36.7 C) 97.7 F (36.5 C)  TempSrc:  Oral    SpO2: 97% 96% 93% 96%  Weight:      Height:  Intake/Output Summary (Last 24 hours) at 03/29/2021 1041 Last data filed at 03/28/2021 2321 Gross per 24 hour  Intake 50 ml  Output 200 ml  Net -150 ml   Filed Weights   03/28/21 1123  Weight: 112 kg    Examination:  General exam: No apparent distress Respiratory system: Clear to auscultation. Respiratory effort normal. Cardiovascular system: S1-S2, regular  rate and rhythm, no murmurs, no pedal edema  gastrointestinal system: Soft, nondistended, RUQ tender to palpation, normal bowel sounds Central nervous system: Alert and oriented. No focal neurological deficits. Extremities: Symmetric 5 x 5 power. Skin: No rashes, lesions or ulcers Psychiatry: Judgement and insight appear normal. Mood & affect appropriate.     Data Reviewed: I have personally reviewed following labs and imaging studies  CBC: Recent Labs  Lab 03/28/21 1126 03/29/21 0440  WBC 11.1* 8.5  HGB 16.1 15.0  HCT 47.5 46.1  MCV 84.4 87.0  PLT 214 [id number]   Basic Metabolic Panel: Recent Labs  Lab 03/28/21 1126 03/29/21 0440  NA 138 141  K 3.8 4.7  CL 107 106  CO2 25 31  GLUCOSE 313* 227*  BUN 17 27*  CREATININE 1.19 1.56*  CALCIUM 9.0 8.7*   GFR: Estimated Creatinine Clearance: 71.6 mL/min (A) (by C-G formula based on SCr of 1.56 mg/dL (H)). Liver Function Tests: Recent Labs  Lab 03/28/21 1324 03/29/21 0440  AST 27 14*  ALT 20 17  ALKPHOS 59 56  BILITOT 2.1* 1.4*  PROT 6.3* 6.1*  ALBUMIN 3.3* 3.2*   Recent Labs  Lab 03/28/21 1126  LIPASE 29   No results for input(s): AMMONIA in the last 168 hours. Coagulation Profile: No results for input(s): INR, PROTIME in the last 168 hours. Cardiac Enzymes: No results for input(s): CKTOTAL, CKMB, CKMBINDEX, TROPONINI in the last 168 hours. BNP (last 3 results) No results for input(s): PROBNP in the last 8760 hours. HbA1C: Recent Labs    03/28/21 1126  HGBA1C 8.1*   CBG: Recent Labs  Lab 03/28/21 2101 03/29/21 0835  GLUCAP 181* 198*   Lipid Profile: No results for input(s): CHOL, HDL, LDLCALC, TRIG, CHOLHDL, LDLDIRECT in the last 72 hours. Thyroid Function Tests: No results for input(s): TSH, T4TOTAL, FREET4, T3FREE, THYROIDAB in the last 72 hours. Anemia Panel: No results for input(s): VITAMINB12, FOLATE, FERRITIN, TIBC, IRON, RETICCTPCT in the last 72 hours. Sepsis Labs: No results for input(s):  PROCALCITON, LATICACIDVEN in the last 168 hours.  Recent Results (from the past 240 hour(s))  Resp Panel by RT-PCR (Flu A&B, Covid) Nasopharyngeal Swab     Status: None   Collection Time: 03/28/21  5:52 PM   Specimen: Nasopharyngeal Swab; Nasopharyngeal(NP) swabs in vial transport medium  Result Value Ref Range Status   SARS Coronavirus 2 by RT PCR NEGATIVE NEGATIVE Final    Comment: (NOTE) SARS-CoV-2 target nucleic acids are NOT DETECTED.  The SARS-CoV-2 RNA is generally detectable in upper respiratory specimens during the acute phase of infection. The lowest concentration of SARS-CoV-2 viral copies this assay can detect is 138 copies/mL. A negative result does not preclude SARS-Cov-2 infection and should not be used as the sole basis for treatment or other patient management decisions. A negative result may occur with  improper specimen collection/handling, submission of specimen other than nasopharyngeal swab, presence of viral mutation(s) within the areas targeted by this assay, and inadequate number of viral copies(<138 copies/mL). A negative result must be combined with clinical observations, patient history, and epidemiological information. The expected result is Negative.  Fact Sheet for Patients:  EntrepreneurPulse.com.au  Fact Sheet for Healthcare Providers:  IncredibleEmployment.be  This test is no t yet approved or cleared by the [place] FDA and  has been authorized for detection and/or diagnosis of SARS-CoV-2 by FDA under an Emergency Use Authorization (EUA). This EUA will remain  in effect (meaning this test can be used) for the duration of the COVID-19 declaration under Section 564(b)(1) of the Act, 21 U.S.C.section 360bbb-3(b)(1), unless the authorization is terminated  or revoked sooner.       Influenza A by PCR NEGATIVE NEGATIVE Final   Influenza B by PCR NEGATIVE NEGATIVE Final    Comment: (NOTE) The Xpert Xpress  SARS-CoV-2/FLU/RSV plus assay is intended as an aid in the diagnosis of influenza from Nasopharyngeal swab specimens and should not be used as a sole basis for treatment. Nasal washings and aspirates are unacceptable for Xpert Xpress SARS-CoV-2/FLU/RSV testing.  Fact Sheet for Patients: EntrepreneurPulse.com.au  Fact Sheet for Healthcare Providers: IncredibleEmployment.be  This test is not yet approved or cleared by the [place] FDA and has been authorized for detection and/or diagnosis of SARS-CoV-2 by FDA under an Emergency Use Authorization (EUA). This EUA will remain in effect (meaning this test can be used) for the duration of the COVID-19 declaration under Section 564(b)(1) of the Act, 21 U.S.C. section 360bbb-3(b)(1), unless the authorization is terminated or revoked.  Performed at [facility], [address] [place], [place] [address]          Radiology Studies: DG Chest 2 View  Result Date: 03/28/2021 CLINICAL DATA:  Chest pain EXAM: CHEST - 2 VIEW COMPARISON:  03/01/2021 FINDINGS: Stable cardiomediastinal contours. Low lung volumes with bibasilar atelectasis. Chronically coarsened interstitial markings bilaterally. No pleural effusion or pneumothorax. IMPRESSION: Low lung volumes with bibasilar atelectasis. Electronically Signed   By: Davina Poke D.O.   On: 03/28/2021 12:18   CT ABDOMEN PELVIS W CONTRAST  Result Date: 03/28/2021 CLINICAL DATA:  Abdominal distension with nausea and vomiting. EXAM: CT ABDOMEN AND PELVIS WITH CONTRAST TECHNIQUE: Multidetector CT imaging of the abdomen and pelvis was performed using the standard protocol following bolus administration of intravenous contrast. CONTRAST:  43m OMNIPAQUE IOHEXOL 350 MG/ML SOLN COMPARISON:  09/30/2017. FINDINGS: Lower chest: Basilar septal thickening with moderate right and small left pleural effusions. Mild dependent atelectasis in the right lower lobe.  Heart is enlarged. No pericardial effusion. Hepatobiliary: Liver is enlarged, 21.2 cm. Margin is slightly irregular with left hepatic lobe and caudate hypertrophy. Gallbladder wall edema. No biliary ductal dilatation. Pancreas: Negative. Spleen: Negative. Adrenals/Urinary Tract: Adrenal glands are unremarkable. 1.4 cm fluid density lesion in the lower pole right kidney is indicative of a cyst. Other subcentimeter low-attenuation lesions in the kidneys are too small to definitively characterize. Kidneys are otherwise unremarkable. Ureters are decompressed. Bladder is grossly unremarkable. Stomach/Bowel: Stomach, small bowel, appendix and colon are unremarkable. Vascular/Lymphatic: Atherosclerotic calcification of the aorta. No pathologically enlarged lymph nodes. Reproductive: Prostate is visualized. Other: Small bilateral inguinal hernias contain fat. No free fluid. Mesenteries and peritoneum are unremarkable. Musculoskeletal: Degenerative changes in the spine. Old anterior right rib fracture. No worrisome lytic or sclerotic lesions. IMPRESSION: 1. No acute findings to explain the patient's clinical history. 2. Congestive heart failure. 3. Enlarged liver with cirrhotic morphology. 4. Gallbladder wall edema, nonspecific. Please correlate for right upper quadrant pain and consider ultrasound, as clinically warranted. 5.  Aortic atherosclerosis (ICD10-I70.0). Electronically Signed   By: MLorin PicketM.D.   On: 03/28/2021 14:43  US Abdomen Limited RUQ (LIVER/GB)  Result Date: 03/28/2021 CLINICAL DATA:  Right upper quadrant pain EXAM: ULTRASOUND ABDOMEN LIMITED RIGHT UPPER QUADRANT COMPARISON:  CT abdomen pelvis 03/28/2021. Ultrasound abdomen 02/19/2021 FINDINGS: Gallbladder: Contracted gallbladder with diffuse gallbladder wall thickening measuring 7 mm. Negative sonographic Murphy sign. No gallstones identified. Common bile duct: Diameter: 5 mm Liver: Mild increased echogenicity liver parenchyma without focal  liver lesion. Portal vein is patent on color Doppler imaging with normal direction of blood flow towards the liver. Other: None. IMPRESSION: Contracted gallbladder with gallbladder wall thickening. Negative for gallstones or gallbladder wall thickening. Edema in the pericholecystic fat on CT suggestive of acalculous cholecystitis. No biliary dilatation. Electronically Signed   By: Franchot Gallo M.D.   On: 03/28/2021 17:12        Scheduled Meds:  carvedilol  6.25 mg Oral BID WC   insulin aspart  0-15 Units Subcutaneous TID WC   insulin aspart  0-5 Units Subcutaneous QHS   insulin glargine-yfgn  28 Units Subcutaneous QHS   pantoprazole (PROTONIX) IV  40 mg Intravenous Q24H   rivaroxaban  20 mg Oral Q supper   sacubitril-valsartan  0.5 tablet Oral BID   Continuous Infusions:  lactated ringers 125 mL/hr at 03/28/21 2118   piperacillin-tazobactam (ZOSYN)  IV 3.375 g (03/29/21 0610)     LOS: 0 days    Time spent: 35 minutes    Sidney Ace, MD [facility] Pager [phone]  If 7PM-7AM, please contact night-coverage 03/29/2021, 10:41 AM

## 2021-03-29 NOTE — Discharge Summary (Signed)
AMA discharge summary  HPI (per Dx [facility]): Gabriel [name]. is a 50 y.o. male with medical history significant for hypertension, obesity, insulin-dependent diabetes mellitus, GERD, hyperlipidemia,Heart failure with reduced ejection fraction, presents to the emergency department for chief concerns of abdominal pain.   He has been having abdominal pain that has gradually worsen over the last year.  However the pain has gradually worsened over the last week and has become almost unbearable 03/27/2021, prompting him to present to the emergency department for further evaluation.   He endorses subjective fever at home, but he did not take his temperature. He endorses nausea and vomiting, once yesterday and it was yellow.  He denies chest pain, changes to his baseline shortness of breath, dysuria.  He does endorse diarrhea and said that 03/27/2021, he had 4 episodes of loose and watery bowel movements.  He denies blood or melena stool.   He states that the abdominal pain is worse with eating and deep palpitation at the right upper quadrant and right lower lung/rib area.  Hospital Course:  Patient was admitted to the medicine service with general surgery consulting.  Patient was placed on intravenous Zosyn, IV fluids, as needed pain control.  Initial imaging survey included CT abdomen pelvis and right upper quadrant ultrasound.  There was initial concern for a calculus cholecystitis.  General surgery initially plan on cholecystectomy however reconsidered and pursued HIDA scan.  HIDA scan was negative and the cystic duct was patent.  Per general surgery cholecystectomy is unlikely to resolve this patient's pain.  I alerted the patient earlier this morning that I may just treat with IV fluids, pain control, IV antibiotics.  He indicated that he was not interested in just treating the pain and desired surgical intervention.  General surgery discussed with patient after results of HIDA were known.  At this time patient  became upset.  He elected to leave [place].  Education was attempted at time of his departure.  Decision-making capacity was confirmed prior to his departure.  Ralene Muskrat MD

## 2021-03-29 NOTE — Plan of Care (Signed)
  Problem: Education: Goal: Knowledge of General Education information will improve Description: Including pain rating scale, medication(s)/side effects and non-pharmacologic comfort measures Outcome: Not Progressing   Problem: Pain Managment: Goal: General experience of comfort will improve Outcome: Not Progressing   Problem: Safety: Goal: Ability to remain free from injury will improve Outcome: Not Progressing   Problem: Skin Integrity: Goal: Risk for impaired skin integrity will decrease Outcome: Not Progressing

## 2021-03-30 SURGERY — CHOLECYSTECTOMY, ROBOT-ASSISTED, LAPAROSCOPIC
Anesthesia: General

## 2021-04-04 ENCOUNTER — Other Ambulatory Visit: Payer: Self-pay | Admitting: Cardiovascular Disease

## 2021-04-04 ENCOUNTER — Encounter: Payer: Self-pay | Admitting: Cardiovascular Disease

## 2021-04-04 ENCOUNTER — Ambulatory Visit (INDEPENDENT_AMBULATORY_CARE_PROVIDER_SITE_OTHER): Payer: Medicaid Other | Admitting: Cardiovascular Disease

## 2021-04-04 ENCOUNTER — Other Ambulatory Visit: Payer: Self-pay

## 2021-04-04 VITALS — BP 156/102 | HR 83 | Ht 72.0 in | Wt 254.4 lb

## 2021-04-04 DIAGNOSIS — N1831 Chronic kidney disease, stage 3a: Secondary | ICD-10-CM | POA: Diagnosis not present

## 2021-04-04 DIAGNOSIS — I4729 Other ventricular tachycardia: Secondary | ICD-10-CM

## 2021-04-04 DIAGNOSIS — I10 Essential (primary) hypertension: Secondary | ICD-10-CM | POA: Diagnosis not present

## 2021-04-04 DIAGNOSIS — I5022 Chronic systolic (congestive) heart failure: Secondary | ICD-10-CM | POA: Diagnosis not present

## 2021-04-04 DIAGNOSIS — E785 Hyperlipidemia, unspecified: Secondary | ICD-10-CM

## 2021-04-04 DIAGNOSIS — R0602 Shortness of breath: Secondary | ICD-10-CM | POA: Diagnosis not present

## 2021-04-04 DIAGNOSIS — I428 Other cardiomyopathies: Secondary | ICD-10-CM

## 2021-04-04 DIAGNOSIS — I2510 Atherosclerotic heart disease of native coronary artery without angina pectoris: Secondary | ICD-10-CM

## 2021-04-04 DIAGNOSIS — Z86711 Personal history of pulmonary embolism: Secondary | ICD-10-CM

## 2021-04-04 DIAGNOSIS — I472 Ventricular tachycardia: Secondary | ICD-10-CM

## 2021-04-04 MED ORDER — EZETIMIBE 10 MG PO TABS: 10.0000 mg | ORAL_TABLET | Freq: Every day | ORAL | 3 refills | Status: DC

## 2021-04-04 MED ORDER — FUROSEMIDE 8 MG/ML PO SOLN: 80.0000 mg | Freq: Once | ORAL | Status: DC

## 2021-04-04 MED ORDER — TORSEMIDE 40 MG PO TABS: 40.0000 mg | ORAL_TABLET | Freq: Two times a day (BID) | ORAL | 3 refills | Status: DC

## 2021-04-04 MED ORDER — TORSEMIDE 20 MG PO TABS: 40.0000 mg | ORAL_TABLET | Freq: Two times a day (BID) | ORAL | 0 refills | Status: DC

## 2021-04-04 MED ORDER — FUROSEMIDE 10 MG/ML IJ SOLN
80.0000 mg | Freq: Once | INTRAMUSCULAR | Status: AC
  Administered 2021-04-04: 80 mg via INTRAVENOUS

## 2021-04-04 MED ORDER — METOLAZONE 5 MG PO TABS: 5.0000 mg | ORAL_TABLET | Freq: Every day | ORAL | 0 refills | Status: DC | PRN

## 2021-04-04 NOTE — Progress Notes (Signed)
Patient ID: Gabriel [name]., male   DOB: 15-May-1971, 50 y.o.   MRN: [id number] Cardiology Office Note  Date:  04/04/2021   ID:  Gabriel [name]., DOB May 14, 1971, MRN [id number]  PCP:  Virginia Crews, MD   Chief Complaint  Patient presents with   2 month follow up     Patient was at [facility] with chest pain on 03/31/2021. Patient c/o shortness of breath, abdominal swelling and chest pressure for about two and half weeks. Medications reviewed by the patient verbally.     HPI:  50 y.o. male with h/o  nonischemic cardiomyopathy by nuclear stress test in 2016, OSA, did not tolerate CPAP History of medication noncompliance History of chronic chest pain [facility] on 01/03/16 with cough and SOB, septic shock, right upper lobe pneumonia, neurology thinks her water Left heart cath 01/06/2017  left ventricular ejection fraction  25-35% by visual estimate. chronic systolic CHF, Follow-up echocardiograms ejection fraction 30-35%, up to 45% - 50% with medical management COPD, long smoking history Prior runs of nonsustained VT, syncope DM2, med noncompliance, Morbid obesity Cath 05/2020: occluded RCA with collaterals He presents today for follow-up of his nonischemic cardiomyopathy, chronic systolic CHF, weakness, dehydration  Last seen by myself May 2021 Frequent trips to the emergency room for rib pain, COPD exacerbation, CHF, abdominal pain 7 emergency room trips this year He was admitted March 28, 2021 abdominal pain, left AMA He was placed on IV antibiotics, pain control, abdomen was imaged HIDA scan negative He desired surgical intervention and left AMA  3 days later presented to [facility] They told him he had fluid in his abdomen from heart failure causing his pain Was told to take his torsemide and was discharged from the emergency room  Weight 254 "Should be 236" he feels Abdomen distended, not having significant leg swelling Has not been adjusting his torsemide based on  his weight, continues to take torsemide 20 mg daily High fluid intake Attributes weight gain to being recently in the hospital twice as detailed above  On discussion of his diuretics, when I last saw him 1 year ago he was on torsemide 40 twice daily, now only taking 20 daily Reports that he got dehydrated once and was told to decrease his torsemide  Working at [employer], does not feel that he can work today or through the weekend given his shortness of breath symptoms  Does not have any metolazone at home He is requesting IV Lasix  EKG personally reviewed by myself on todays visit Normal sinus rhythm rate 83 bpm nonspecific ST abnormality  Other past medical history reviewed  echo 11/23/2019:  1. Left ventricular ejection fraction, by estimation, is 25 to 30%. The  left ventricle has severely decreased function. The left ventricle  demonstrates global hypokinesis. The left ventricular internal cavity size  was mildly dilated. There is mild left  ventricular hypertrophy. Left ventricular diastolic parameters are  consistent with Grade III diastolic dysfunction (restrictive). Elevated  left atrial pressure.   Other past medical history reviewed Seen in the emergency room 11/16/2017 Chronic chest pain shortness of breath Low back pain pain radiating down left leg Normal renal function Emergency room told him that they do not do outpatient workup and do not provide pain medication  Left heart cath 01/06/2017 Nonobstructive coronary disease, 40% in RCA  left ventricular ejection fraction  25-35% by visual estimate.   Reports having sleep study in the past, was positive, did not tolerate CPAP.  Does  not want to wear that, feels he sleeps fine    PMH:   has a past medical history of CAD (coronary artery disease), Chest wall pain, chronic, CHF (congestive heart failure) (HCC), Chronic Troponin Elevation, CKD (chronic kidney disease), stage II-III, COPD (chronic obstructive pulmonary  disease) ([place]), Diabetes mellitus without complication ([place]), HFrEF (heart failure with reduced ejection fraction) ([place]), Hypertension, Mixed Ischemic & NICM (nonischemic cardiomyopathy) ([place]), Myocardial infarct ([place]), NSVT (nonsustained ventricular tachycardia) ([place]), Obesity (BMI 30.0-34.9), Psoriasis, Syncope, and Tobacco abuse.  PSH:    Past Surgical History:  Procedure Laterality Date   AMPUTATION     CARDIAC CATHETERIZATION     FINGER AMPUTATION     Traumatic   FINGER FRACTURE SURGERY Left    LEFT HEART CATH AND CORONARY ANGIOGRAPHY N/A 01/06/2017   Procedure: Left Heart Cath and Coronary Angiography;  Surgeon: Wellington Hampshire, MD;  Location: [facility];  Service: Cardiovascular;  Laterality: N/A;   RIGHT/LEFT HEART CATH AND CORONARY ANGIOGRAPHY N/A 06/13/2020   Procedure: RIGHT/LEFT HEART CATH AND CORONARY ANGIOGRAPHY;  Surgeon: Nelva Bush, MD;  Location: [facility];  Service: Cardiovascular;  Laterality: N/A;    Current Outpatient Medications  Medication Sig Dispense Refill   carvedilol (COREG) 6.25 MG tablet Take 1 tablet (6.25 mg total) by mouth 2 (two) times daily with a meal. 180 tablet 3   cetirizine (ZYRTEC) 10 MG tablet Take 10 mg by mouth daily.     ezetimibe (ZETIA) 10 MG tablet Take 1 tablet (10 mg total) by mouth daily. 90 tablet 3   fenofibrate (TRICOR) 145 MG tablet Take 1 tablet (145 mg total) by mouth daily. 30 tablet 6   Insulin Glargine (BASAGLAR KWIKPEN) 100 UNIT/ML Inject 28 Units into the skin at bedtime. 15 mL 2   insulin lispro (HUMALOG) 100 UNIT/ML KwikPen Inject 3 Units into the skin 3 (three) times daily.     Ipratropium-Albuterol (COMBIVENT RESPIMAT) 20-100 MCG/ACT AERS respimat Inhale 1 puff into the lungs every 6 (six) hours as needed for wheezing.     metolazone (ZAROXOLYN) 5 MG tablet Take 1 tablet (5 mg total) by mouth daily as needed (for weight over 245). 30 tablet 0   nitroGLYCERIN (NITROSTAT) 0.4 MG SL tablet Place 1  tablet (0.4 mg total) under the tongue every 5 (five) minutes x 3 doses as needed for chest pain. 30 tablet 0   oxyCODONE-acetaminophen (PERCOCET) 5-325 MG tablet Take 2 tablets by mouth every 6 (six) hours as needed for severe pain. 16 tablet 0   rivaroxaban (XARELTO) 20 MG TABS tablet Take 1 tablet (20 mg total) by mouth daily with supper. 30 tablet 0   rosuvastatin (CRESTOR) 20 MG tablet Take 1 tablet (20 mg total) by mouth at bedtime. 30 tablet 6   sacubitril-valsartan (ENTRESTO) 49-51 MG Take 0.5 tablets by mouth 2 (two) times daily.     torsemide (DEMADEX) 20 MG tablet Take 2 tablets (40 mg total) by mouth 2 (two) times daily. Take an extra 2 tablets ('40MG'$ ) as needed for shortness of breath. [id number] tablet 0   TRULICITY [id number] 0000000 SOPN INJECT 0.75 MG INTO THE SKIN ONCE A WEEK. 2 mL 2   lidocaine (LIDODERM) 5 % Place 1 patch onto the skin every 12 (twelve) hours. Remove & Discard patch within 12 hours or as directed by MD (Patient not taking: Reported on 04/04/2021) 10 patch 0   No current facility-administered medications for this visit.    Allergies:   Metformin and related,  Prednisone, and Zantac [ranitidine hcl]   Social History:  The patient  reports that he has been smoking Cigarettes.  He has been smoking about 2.00 packs per day for the past 33 years. He has never used smokeless tobacco. He reports that he drinks about 1.8 oz of alcohol per week. He reports that he does not use illicit drugs.   Family History:   family history includes Cancer in his maternal grandfather and paternal aunt; Diabetes in his maternal grandfather; Diabetes Mellitus II in his mother; Gout in his father; Hypertension in his father and mother; Hypothyroidism in his mother.    Review of Systems: Review of Systems  Constitutional: Negative.        Abdominal distention  HENT: Negative.    Respiratory:  Positive for shortness of breath.   Cardiovascular: Negative.   Gastrointestinal: Negative.    Musculoskeletal:  Positive for back pain.  Psychiatric/Behavioral: Negative.    All other systems reviewed and are negative.   PHYSICAL EXAM: VS:  BP (!) 156/102 (BP Location: Left Arm, Patient Position: Sitting, Cuff Size: Normal)   Pulse 83   Ht 6' (1.829 m)   Wt 254 lb 6 oz (115.4 kg)   SpO2 98%   BMI 34.50 kg/m  , BMI Body mass index is 34.5 kg/m. Constitutional:  oriented to person, place, and time. No distress.  HENT:  Head: Grossly normal Eyes:  no discharge. No scleral icterus.  Neck: No JVD, no carotid bruits  Cardiovascular: Regular rate and rhythm, no murmurs appreciated Pulmonary/Chest: Clear to auscultation bilaterally, no wheezes or rails Abdominal: Soft.  no distension.  no tenderness.  Musculoskeletal: Normal range of motion Neurological:  normal muscle tone. Coordination normal. No atrophy Skin: Skin warm and dry Psychiatric: normal affect, pleasant   Recent Labs: 11/23/2020: TSH 1.211 11/27/2020: Magnesium 1.7 02/14/2021: B Natriuretic Peptide 795.3 03/29/2021: ALT 17; BUN 27; Creatinine, Ser 1.56; Hemoglobin 15.0; Platelets 212; Potassium 4.7; Sodium 141    Lipid Panel Lab Results  Component Value Date   CHOL 185 11/23/2020   HDL 19 (L) 11/23/2020   LDLCALC UNABLE TO CALCULATE IF TRIGLYCERIDE OVER 400 mg/dL 11/23/2020   TRIG 900 (H) 11/23/2020      Wt Readings from Last 3 Encounters:  04/04/21 254 lb 6 oz (115.4 kg)  03/28/21 246 lb 14.6 oz (112 kg)  03/01/21 245 lb (111.1 kg)      ASSESSMENT AND PLAN:   Cardiomyopathy ([place]) -Dilated, nonischemic Long history of medication noncompliance, Reports he is receiving his medications, he is on Medicaid Taking torsemide 20, has not titrated up on his torsemide despite significant weight gain approximately 15 to 20 pounds and abdominal distention On prior hospital admission torsemide was decreased down to 20 daily Stressed him the importance of adjusting of his torsemide depending on his fluid  intake, weight gain -Given his distress today we will give him IV Lasix 80 mg x 1 -We have recommended he take metolazone 5 mg with torsemide 60 mg approximately 30 minutes later starting tomorrow which is August 18 and August 19 Torsemide 40 mg daily over the weekend with no metolazone We will talk with him again on Monday morning August 22 to determine his weight and further medication options We have recommended fluid restriction  Essential hypertension Blood pressure elevated today Just took his morning medications, also blood pressure should improve with aggressive diuresis in the next several days  Chronic systolic heart failure (HCC) Acute exacerbation today with abdominal distention and shortness of  breath symptoms, PND orthopnea Plan as above  coronary artery disease without angina Previous cardiac catheterization with nonobstructive disease,  unrelated to his cardiomyopathy Smoking cessation recommended No further work-up at this time  Morbid obesity Also with likely underlying sleep apnea, previously declined CPAP Calorie restriction recommended   Total encounter time more than 35 minutes  Greater than 50% was spent in counseling and coordination of care with the patient     Orders Placed This Encounter  Procedures   EKG 12-Lead     Signed, Esmond Plants, M.D., Ph.D. 04/04/2021  [place], [place]

## 2021-04-04 NOTE — Telephone Encounter (Signed)
Looks like pharmacy is saying torsemide 40 isnt covered by medicaid.  Looks like they are requesting [place].

## 2021-04-04 NOTE — Patient Instructions (Addendum)
We will give you lasix 80 mg IV x1 today  Medication Instructions:  Please start zetia 10 mg daily  Please take metolazone 5 mg x 1 daily, Torsemide 60 mg 30 min later Do this for 2 day (Thursday and Friday) We will touch base with you on Monday  Torsemide 40, extra as needed daily over the weekend  We will call you on Monday  If you need a refill on your cardiac medications before your next appointment, please call your pharmacy.    Lab work: No new labs needed   If you have labs (blood work) drawn today and your tests are completely normal, you will receive your results only by: [place] (if you have MyChart) OR A paper copy in the mail If you have any lab test that is abnormal or we need to change your treatment, we will call you to review the results.   Testing/Procedures: No new testing needed   Follow-Up: At [facility], you and your health needs are our priority.  As part of our continuing mission to provide you with exceptional heart care, we have created designated Provider Care Teams.  These Care Teams include your primary Cardiologist (physician) and Advanced Practice Providers (APPs -  Physician Assistants and Nurse Practitioners) who all work together to provide you with the care you need, when you need it.  You will need a follow up appointment in 1 month  Providers on your designated Care Team:   Murray Hodgkins, NP Christell Faith, PA-C Marrianne Mood, PA-C Cadence Kathlen Mody, [place]  Any Other Special Instructions Will Be Listed Below (If Applicable).  COVID-19 Vaccine Information can be found at: [email or web address].uk For questions related to vaccine distribution or appointments, please email [email or web address] or call [phone].

## 2021-04-04 NOTE — Telephone Encounter (Signed)
Spoke with Pharmacy.  Stated Torsemide '40MG'$  was not approved through [place] but the '20MG'$  tablets was.   Resent in medication as Torsemide '20MG'$  -- take 2 tablets ('40MG'$ ) by mouth twice a day with an extra 2 tablets ('40Mg'$ ) as needed for Shortness of breath.

## 2021-04-05 ENCOUNTER — Telehealth: Payer: Self-pay | Admitting: *Deleted

## 2021-04-05 DIAGNOSIS — N1831 Chronic kidney disease, stage 3a: Secondary | ICD-10-CM

## 2021-04-05 DIAGNOSIS — R252 Cramp and spasm: Secondary | ICD-10-CM

## 2021-04-05 DIAGNOSIS — I5022 Chronic systolic (congestive) heart failure: Secondary | ICD-10-CM

## 2021-04-05 DIAGNOSIS — Z79899 Other long term (current) drug therapy: Secondary | ICD-10-CM

## 2021-04-05 NOTE — Telephone Encounter (Signed)
-----   Message from Wynema Birch, RN sent at 04/04/2021 10:26 AM EDT ----- Regarding: Call Monday 8/22 Hey Triage [facility] Jinny Blossom),  [name] seen pt in clinic Wed 04/04/2021 We gave 80 mg IV Lasix  Pt given instructions over the weekend as follow  Please take metolazone 5 mg once daily Then take Torsemide 60 mg 30 min later Do this for 2 day (Thursday and Friday)  Torsemide 40 mg, with extra as needed daily over the weekend  Pt is expecting a call Monday 8/22 to see how he is feeling with swelling/weight gain/SOB. Please refer to [name] as he may need to send in for another dose of IV lasix to OP pharmacy [name] asked to consult him in clinic for further instructions ( be best in person as he will likely NOT respond to messages).  I will not be back in till Wed 8/24.  Thanks [employer]

## 2021-04-06 MED ORDER — POTASSIUM CHLORIDE CRYS ER 20 MEQ PO TBCR
EXTENDED_RELEASE_TABLET | ORAL | 0 refills | Status: DC
Start: 2021-04-06 — End: 2021-04-09

## 2021-04-06 NOTE — Telephone Encounter (Signed)
Patient calling  He thinks the water pills are taking too much off him - beginning to get leg cramps Would like to know if we can call in potassium pills  Please call to discuss

## 2021-04-06 NOTE — Telephone Encounter (Signed)
I spoke with the patient. I advised him I have spoken with [name] regarding his cramping and have received orders to take potassium 20 meq once daily when taking torsemide 40 mg BID.  I inquired what the patient's weights have been running yesterday and this morning.  He advised: 8/17- weight 254 lbs in office 8/18- weight 240 lbs 8/19- weight 238 lbs  I inquired what his dry weight is and he stated [name] wanted to try to get him around 235 lbs.  The patient said he did not and is not going to take metolazone today. He plans to resume this tomorrow or Saturday. He is taking torsemide as directed.   I have advised the patient that with his 16 lb weight loss, he may potentially be a bit hypokalemic.  I have advised: 1) take potassium 20 meq once daily today and tomorrow to see if cramping improves 2) starting Sunday, only take potassium when taking torsemide 40 mg BID 3) we will call him on Monday to follow up as previously planned 4) he will need to come to the [place] on Monday/ Tuesday next week for a BMP to be done  The patient voices understanding of the above recommendations and is agreeable.

## 2021-04-06 NOTE — Telephone Encounter (Signed)
Secure chat response received as stated below from [name]: in the past potassium fine on torsemide 40 , but we can send in potassium 20 daily when taking torsemide 40 BID. BMP next week, we were going to touch base with him on Monday to discuss weight. he was about to pop in clinic

## 2021-04-06 NOTE — Telephone Encounter (Signed)
Secure chat sent to [name] to please advise if he would like the patient to come in for a BMP today. Pending MD response.

## 2021-04-09 ENCOUNTER — Other Ambulatory Visit
Admission: RE | Admit: 2021-04-09 | Discharge: 2021-04-09 | Disposition: A | Discharge status: Home / Self Care | Payer: Medicaid Other | Source: Ambulatory Visit | Attending: Cardiovascular Disease | Admitting: Cardiovascular Disease

## 2021-04-09 ENCOUNTER — Ambulatory Visit: Payer: Medicaid Other | Admitting: Cardiovascular Disease

## 2021-04-09 DIAGNOSIS — I5022 Chronic systolic (congestive) heart failure: Secondary | ICD-10-CM | POA: Insufficient documentation

## 2021-04-09 DIAGNOSIS — R252 Cramp and spasm: Secondary | ICD-10-CM | POA: Diagnosis present

## 2021-04-09 DIAGNOSIS — N1831 Chronic kidney disease, stage 3a: Secondary | ICD-10-CM

## 2021-04-09 DIAGNOSIS — Z79899 Other long term (current) drug therapy: Secondary | ICD-10-CM | POA: Diagnosis present

## 2021-04-09 LAB — BASIC METABOLIC PANEL
Anion gap: 9 (ref 5–15)
BUN: 26 mg/dL — ABNORMAL HIGH (ref 6–20)
CO2: 29 mmol/L (ref 22–32)
Calcium: 9.5 mg/dL (ref 8.9–10.3)
Chloride: 100 mmol/L (ref 98–111)
Creatinine, Ser: 1.09 mg/dL (ref 0.61–1.24)
GFR, Estimated: >60 mL/min (ref >60)
Glucose, Bld: 236 mg/dL — ABNORMAL HIGH (ref 70–99)
Potassium: 3.7 mmol/L (ref 3.5–5.1)
Sodium: 138 mmol/L (ref 135–145)

## 2021-04-09 MED ORDER — TORSEMIDE 20 MG PO TABS: 40.0000 mg | ORAL_TABLET | Freq: Every day | ORAL | Status: DC

## 2021-04-09 MED ORDER — METOLAZONE 5 MG PO TABS: 5.0000 mg | ORAL_TABLET | Freq: Every day | ORAL | Status: DC | PRN

## 2021-04-09 MED ORDER — POTASSIUM CHLORIDE CRYS ER 20 MEQ PO TBCR: EXTENDED_RELEASE_TABLET | ORAL | 0 refills | Status: DC

## 2021-04-09 NOTE — Telephone Encounter (Signed)
Called pt to follow up. Pt did have BMET completed this morning and preliminary results reviewed with pt.  Renal function improved and Potassium low normal.   Pt states weight this AM 236 lb (goal wt is 235 lb) Cramping has "almost resolved, only a little sore." Swelling subsided per pt.  Denies shortness of breath.   Pt is taking Torsemide 40 mg daily. Metolazone 5 mg PRN.  Potassium 20 mEq daily.   Will discuss with [name] to verify plan of care from this point forward.

## 2021-04-09 NOTE — Telephone Encounter (Signed)
Called pt back after discussing with [name] below information.  New verbal orders received.  Notified pt per [name]:  Continue Torsemide 40 mg daily with current weight (236 lbs).    - Also continue Potassium 20 mEq daily. (Pt reported taking Potassium 20 mEq daily). For weight gain 242 lb or greater, increase Torsemide to 40 mg TWICE daily. For weight gain 250 lb or greater, add Metolazone 5 mg daily AS NEEDED.   - Increase Potassium to 20 mEq TWICE daily when taking Torsemide TWICE daily and/or taking Metolazone PRN.  Asked pt to contact our office if his weight is trending back up and is needing to take Metolazone PRN. Pt voiced understanding. Pt will otherwise follow up as scheduled 05/07/21.

## 2021-05-01 ENCOUNTER — Other Ambulatory Visit: Payer: Self-pay | Admitting: Cardiovascular Disease

## 2021-05-02 ENCOUNTER — Encounter (HOSPITAL_COMMUNITY): Payer: Self-pay | Admitting: Radiology

## 2021-05-03 ENCOUNTER — Other Ambulatory Visit: Payer: Self-pay | Admitting: Cardiovascular Disease

## 2021-05-07 ENCOUNTER — Ambulatory Visit: Payer: Medicaid Other | Admitting: Nurse Practitioner

## 2021-05-07 NOTE — Progress Notes (Deleted)
Office Visit    Patient Name: Gabriel [name]. Date of Encounter: 05/07/2021  Primary Care Provider:  Virginia Crews, MD Primary Cardiologist:  [name], MD  Chief Complaint    50 year old male with a history of coronary artery disease, chronic chest pain, chronic troponin elevation, HFrEF, mixed ischemic and nonischemic cardiomyopathy (EF 35-40% October 2021), nonsustained VT, stage III chronic kidney disease, COPD, chronic anticoagulation secondary to unprovoked pulmonary embolism, type 2 diabetes mellitus, and obesity, who presents for follow-up related to CHF.  Past Medical History    Past Medical History:  Diagnosis Date   CAD (coronary artery disease)    a. 04/2015 low risk MV;  b. 12/2016 Cath: minor irregs in LAD/Diag/LCX/OM, RCA 40p/m/d; c. 05/2020 Cath: LM nl, LAD 50d, LCX 30p, OM1 40, RCA 100p w/ L->L collats. CO/CI 3.1/1.3-->Med rx.   Chest wall pain, chronic    CHF (congestive heart failure) (HCC)    Chronic Troponin Elevation    CKD (chronic kidney disease), stage II-III    COPD (chronic obstructive pulmonary disease) (HCC)    Diabetes mellitus without complication ([place])    HFrEF (heart failure with reduced ejection fraction) ([place])    Hypertension    Resolved since weight loss   Mixed Ischemic & NICM (nonischemic cardiomyopathy) ([place])    a. 03/2015 Echo: EF 45-50%; b. 12/2015 Echo: EF 20-25%; c. 02/2016 Echo: EF 30-35%; d. 11/2016 Echo: EF 40-45%; e. 06/2019 Echo: EF 30-35%; f. 11/2019 Echo: EF 25-30%; g. 05/2020 Echo: EF 35-40%, glob HK, nl RV size/fxn, PASP 44.4mHg, mod dil LA. Mild to mod MR.   [facility]ocardial infarct ([facility]    NSVT (nonsustained ventricular tachycardia) ([place]    a. 12/2015 noted on tele-->amio;  b. 12/2015 Event monitor: no VT. Atach noted.   Obesity (BMI 30.0-34.9)    Psoriasis    Syncope    a. 01/2016 - felt to be vasovagal.   Tobacco abuse    Past Surgical History:  Procedure Laterality Date   AMPUTATION     CARDIAC CATHETERIZATION      FINGER AMPUTATION     Traumatic   FINGER FRACTURE SURGERY Left    LEFT HEART CATH AND CORONARY ANGIOGRAPHY N/A 01/06/2017   Procedure: Left Heart Cath and Coronary Angiography;  Surgeon: AWellington Hampshire MD;  [facility];  Service: Cardiovascular;  Laterality: N/A;   RIGHT/LEFT HEART CATH AND CORONARY ANGIOGRAPHY N/A 06/13/2020   Procedure: RIGHT/LEFT HEART CATH AND CORONARY ANGIOGRAPHY;  Surgeon: ENelva Bush MD;  [facility];  Service: Cardiovascular;  Laterality: N/A;    Allergies  Allergies  Allergen Reactions   Metformin And Related Nausea And Vomiting   Prednisone Other (See Comments)    Reaction: Hallucinations     Zantac [Ranitidine Hcl] Diarrhea and Nausea Only    Night sweats    History of Present Illness    [age]year old male with the above past medical history including chronic chest pain, chronic troponin elevation, HFrEF, mixed ischemic and nonischemic cardiomyopathy, CAD, nonsustained VT, stage III chronic kidney disease, COPD, unprovoked PE on chronic anticoagulation, type 2 diabetes mellitus, and obesity.  Over the years, he has had multiple hospitalizations for management of chronic HFrEF, chest pain, and elevated troponins.  Prior stress testing in 2016 was nonischemic.  In October 2021, he was admitted with chest pain and elevated troponin above her baseline.  Catheterization revealed a chronic total occlusion of the right coronary artery with left-to-right collaterals.  He was  medically managed and has since been followed intermittently in the advanced heart failure clinic in [place].  Most recent echo showed an EF of 35 to 40% with global hypokinesis in October 2021.  In April 2022, he was admitted to [facility] secondary to near syncope and found to have acute kidney injury.  Torsemide was reduced to 40 mg daily and he was discharged but returned to the emergency department secondary to weakness and hypotension,  prompting admission.  He was subsequently discharged off of Entresto and spironolactone with a reduced dose of torsemide-20 mg daily.  [name] was added back in the outpatient setting.  At his June 2022 visit, he was noted to be up 10 pounds and he reported that he had reduced his torsemide back to 20 mg 4 times a week.  Despite weight gain, he reported feeling well.  He was encouraged to take torsemide 20 mg daily as prescribed.  He was seen in the emergency department at [facility] in August with abdominal pain, which was felt to be secondary to volume overload/heart failure.  He was discharged from the ED with instruction to take his torsemide as prescribed.  At office follow-up on August 17, his weight was up an additional 11 pounds, to 254 pounds.  He was given Lasix 80 mg IV x1 and then instructed to take torsemide 60 mg for 2 days with metolazone 5 mg each of those days.  From there forward, he was provided sliding scale instructions for torsemide usage.  Since his last visit,  Home Medications    Current Outpatient Medications  Medication Sig Dispense Refill   carvedilol (COREG) 6.25 MG tablet Take 1 tablet (6.25 mg total) by mouth 2 (two) times daily with a meal. 180 tablet 3   cetirizine (ZYRTEC) 10 MG tablet Take 10 mg by mouth daily.     ezetimibe (ZETIA) 10 MG tablet Take 1 tablet (10 mg total) by mouth daily. 90 tablet 3   fenofibrate (TRICOR) 145 MG tablet Take 1 tablet (145 mg total) by mouth daily. 30 tablet 6   Insulin Glargine (BASAGLAR KWIKPEN) 100 UNIT/ML Inject 28 Units into the skin at bedtime. 15 mL 2   insulin lispro (HUMALOG) 100 UNIT/ML KwikPen Inject 3 Units into the skin 3 (three) times daily.     Ipratropium-Albuterol (COMBIVENT RESPIMAT) 20-100 MCG/ACT AERS respimat Inhale 1 puff into the lungs every 6 (six) hours as needed for wheezing.     lidocaine (LIDODERM) 5 % Place 1 patch onto the skin every 12 (twelve) hours. Remove & Discard patch within 12 hours or as directed by MD  (Patient not taking: Reported on 04/04/2021) 10 patch 0   metolazone (ZAROXOLYN) 5 MG tablet TAKE 1 TABLET (5 MG TOTAL) BY MOUTH DAILY AS NEEDED (FOR WEIGHT OVER 245). 30 tablet 0   nitroGLYCERIN (NITROSTAT) 0.4 MG SL tablet Place 1 tablet (0.4 mg total) under the tongue every 5 (five) minutes x 3 doses as needed for chest pain. 30 tablet 0   oxyCODONE-acetaminophen (PERCOCET) 5-325 MG tablet Take 2 tablets by mouth every 6 (six) hours as needed for severe pain. 16 tablet 0   potassium chloride SA (KLOR-CON M20) 20 MEQ tablet TAKE 1 TABLET (20 MEQ) ONCE A DAY ONLY WHEN TAKING TORSEMIDE 40 MG TWICE DAILY 30 tablet 0   rivaroxaban (XARELTO) 20 MG TABS tablet Take 1 tablet (20 mg total) by mouth daily with supper. 30 tablet 0   rosuvastatin (CRESTOR) 20 MG tablet Take 1 tablet (20 mg  total) by mouth at bedtime. 30 tablet 6   sacubitril-valsartan (ENTRESTO) 49-51 MG Take 0.5 tablets by mouth 2 (two) times daily.     torsemide (DEMADEX) 20 MG tablet Take 2 tablets (40 mg total) by mouth daily. Take an extra 2 tablets ('40MG'$ ) as needed for shortness of breath and/or for weight 242 lb or greater.     TRULICITY [id number] SOPN INJECT 0.75 MG INTO THE SKIN ONCE A WEEK. 2 mL 2   No current facility-administered medications for this visit.     Review of Systems    ***.  All other systems reviewed and are otherwise negative except as noted above.  Physical Exam    VS:  There were no vitals taken for this visit. , BMI There is no height or weight on file to calculate BMI.     GEN: Well nourished, well developed, in no acute distress. HEENT: normal. Neck: Supple, no JVD, carotid bruits, or masses. Cardiac: RRR, no murmurs, rubs, or gallops. No clubbing, cyanosis, edema.  Radials/DP/PT 2+ and equal bilaterally.  Respiratory:  Respirations regular and unlabored, clear to auscultation bilaterally. GI: Soft, nontender, nondistended, BS + x 4. MS: no deformity or atrophy. Skin: warm and dry, no  rash. Neuro:  Strength and sensation are intact. Psych: Normal affect.  Accessory Clinical Findings    ECG personally reviewed by me today - *** - no acute changes.  Lab Results  Component Value Date   WBC 8.5 03/29/2021   HGB 15.0 03/29/2021   HCT 46.1 03/29/2021   MCV 87.0 03/29/2021   PLT 212 03/29/2021   Lab Results  Component Value Date   CREATININE 1.09 04/09/2021   BUN 26 (H) 04/09/2021   NA 138 04/09/2021   K 3.7 04/09/2021   CL 100 04/09/2021   CO2 29 04/09/2021   Lab Results  Component Value Date   ALT 17 03/29/2021   AST 14 (L) 03/29/2021   ALKPHOS 56 03/29/2021   BILITOT 1.4 (H) 03/29/2021   Lab Results  Component Value Date   CHOL 185 11/23/2020   HDL 19 (L) 11/23/2020   LDLCALC UNABLE TO CALCULATE IF TRIGLYCERIDE OVER 400 mg/dL 11/23/2020   LDLDIRECT 60.6 11/23/2020   TRIG 900 (H) 11/23/2020   CHOLHDL 9.7 11/23/2020    Lab Results  Component Value Date   HGBA1C 8.1 (H) 03/28/2021    Assessment & Plan    1.  ***   Murray Hodgkins, NP 05/07/2021, 9:16 AM

## 2021-05-08 ENCOUNTER — Encounter: Payer: Self-pay | Admitting: Nurse Practitioner

## 2021-05-15 IMAGING — CR DG CHEST 2V
2 series · 2 of 2 positions shown · non-contrast
Comparison: 09/22/2019

CLINICAL DATA: Chest pain.  Shortness of breath.

EXAM:
CHEST - 2 VIEW

[chest pa]
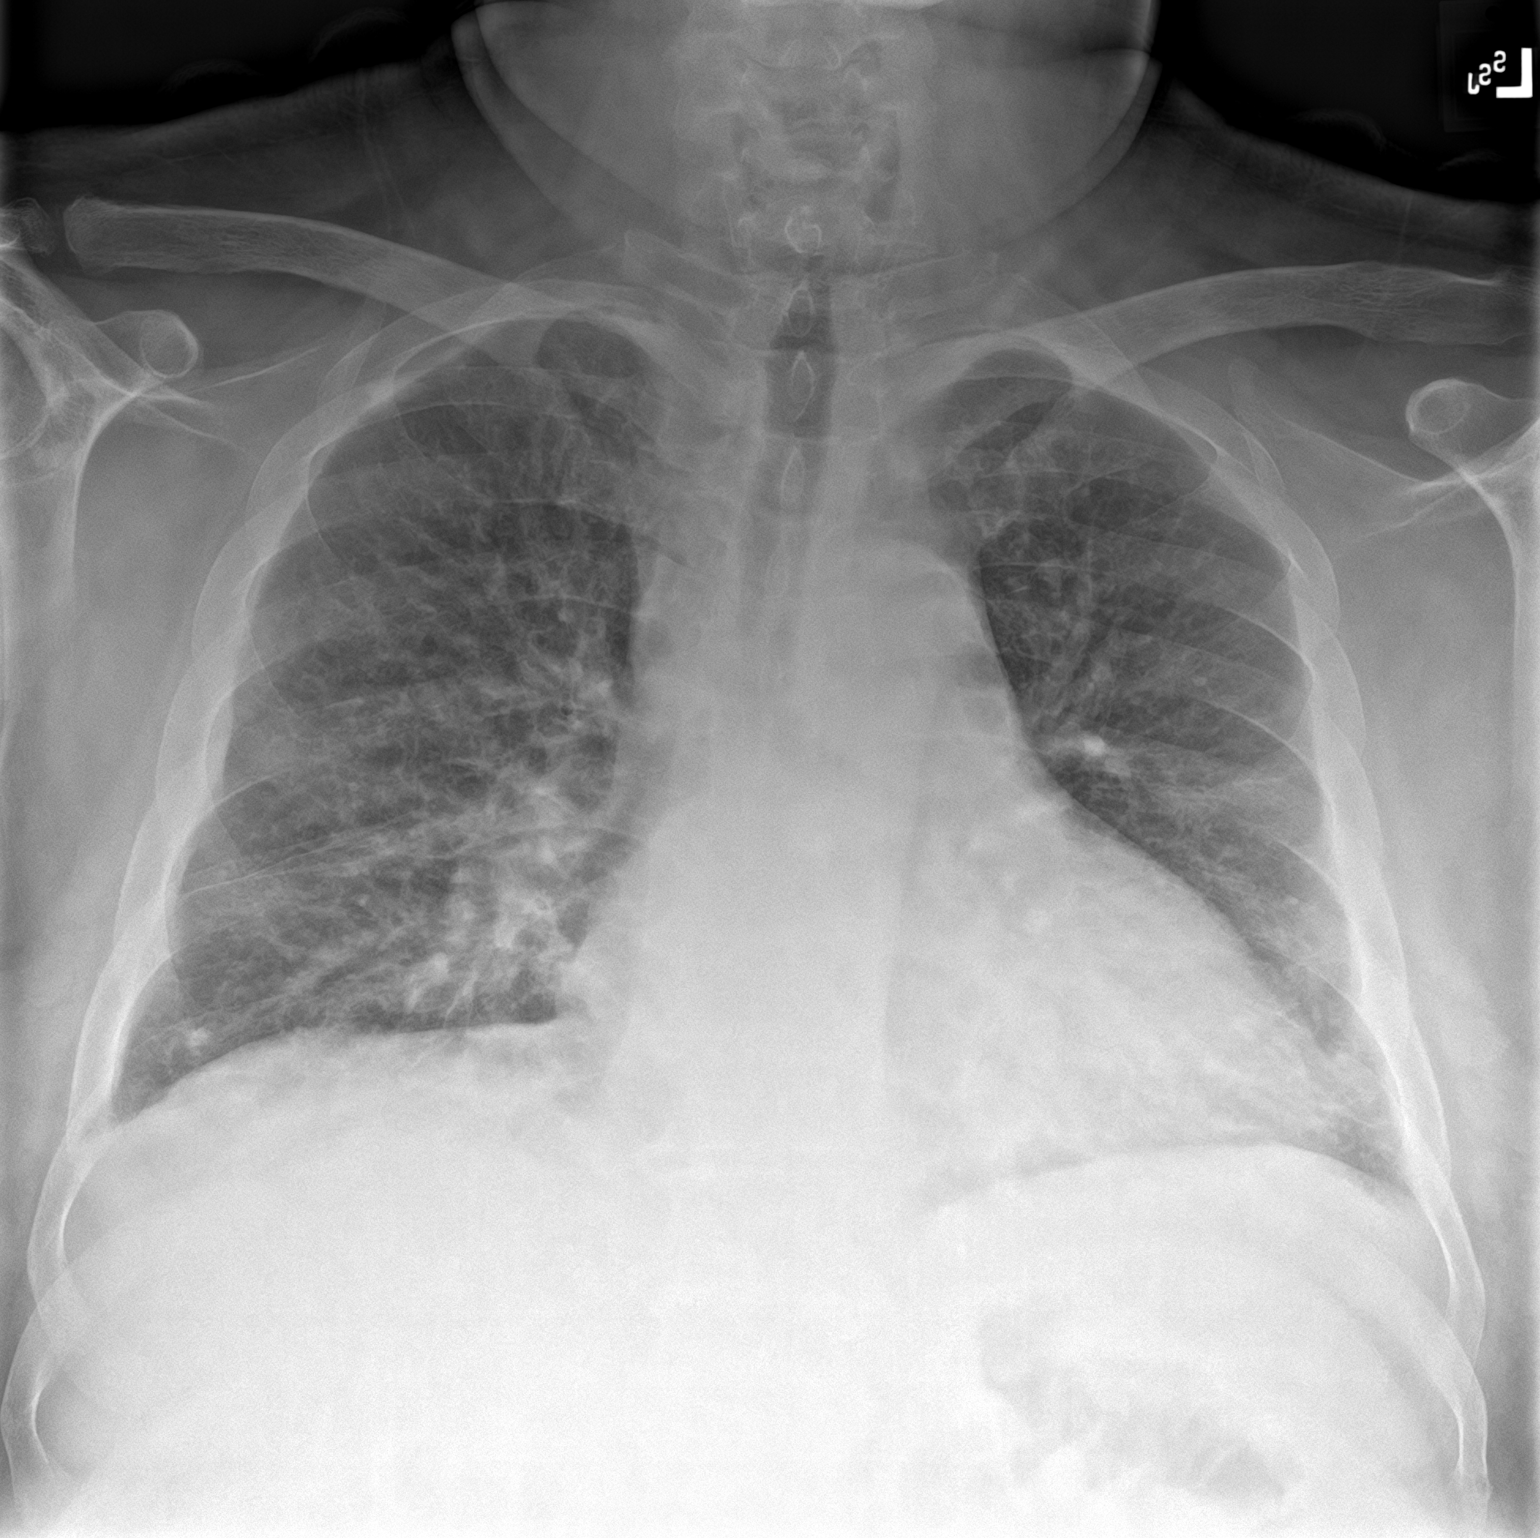

[chest lat]
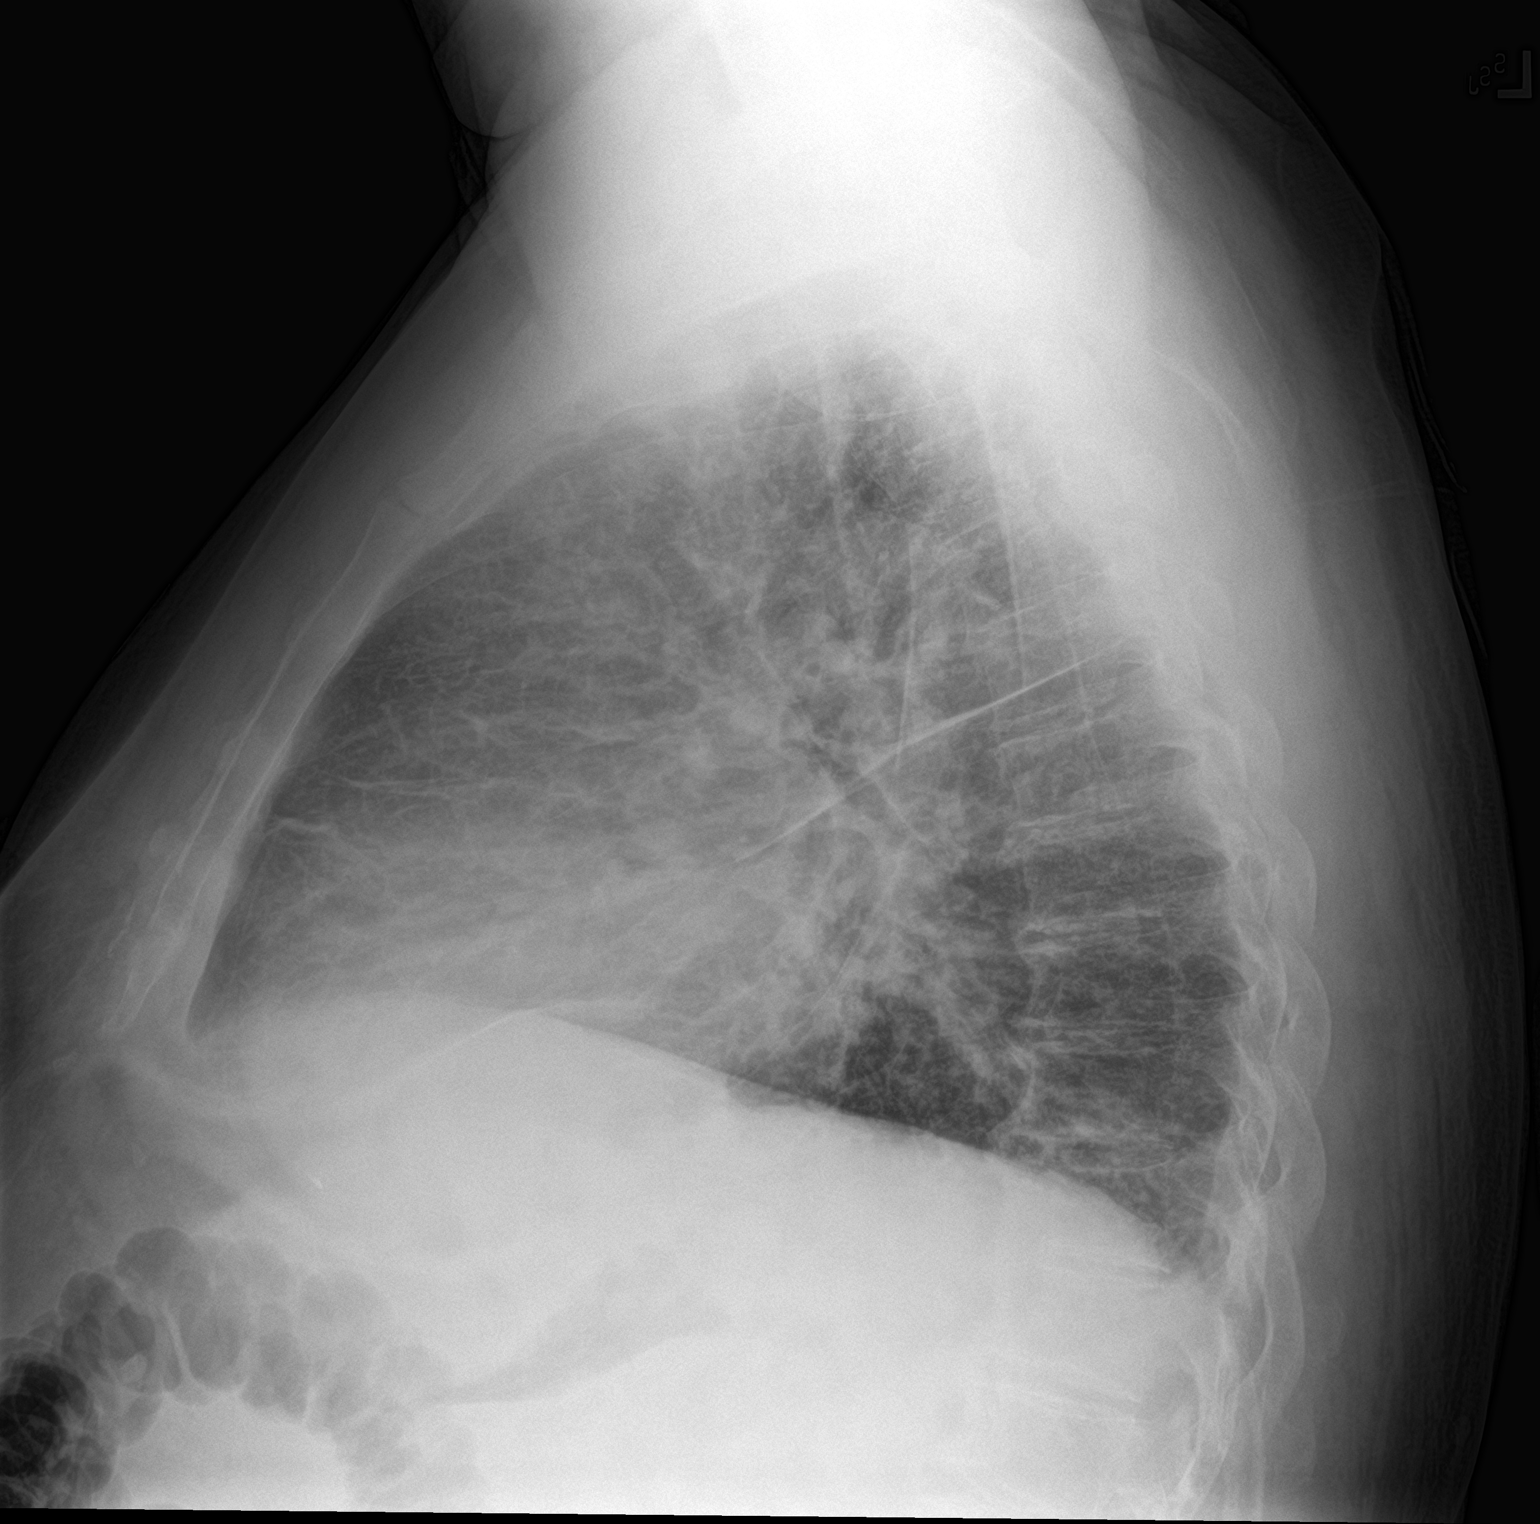

[2 of 2 positions shown; findings below may reference images not displayed]

FINDINGS: Heart size is enlarged. Aortic calcifications are noted. There are
prominent interstitial lung markings. There is no pneumothorax.
There may be trace bilateral pleural effusions. There is no focal
infiltrate. There is no acute osseous abnormality.
IMPRESSION: Cardiomegaly with vascular congestion.

## 2021-05-20 ENCOUNTER — Other Ambulatory Visit: Payer: Self-pay

## 2021-05-20 ENCOUNTER — Emergency Department: Payer: Medicaid Other

## 2021-05-20 ENCOUNTER — Inpatient Hospital Stay
Admission: EM | Admit: 2021-05-20 | Discharge: 2021-05-23 | DRG: 291 | Disposition: A | Discharge status: Home / Self Care | Payer: Medicaid Other | Attending: Internal Medicine | Admitting: Internal Medicine

## 2021-05-20 ENCOUNTER — Encounter: Payer: Self-pay | Admitting: Emergency Medicine

## 2021-05-20 DIAGNOSIS — I255 Ischemic cardiomyopathy: Secondary | ICD-10-CM | POA: Diagnosis present

## 2021-05-20 DIAGNOSIS — R079 Chest pain, unspecified: Secondary | ICD-10-CM | POA: Diagnosis not present

## 2021-05-20 DIAGNOSIS — M79603 Pain in arm, unspecified: Secondary | ICD-10-CM | POA: Diagnosis not present

## 2021-05-20 DIAGNOSIS — I5023 Acute on chronic systolic (congestive) heart failure: Secondary | ICD-10-CM

## 2021-05-20 DIAGNOSIS — R7989 Other specified abnormal findings of blood chemistry: Secondary | ICD-10-CM

## 2021-05-20 DIAGNOSIS — I25118 Atherosclerotic heart disease of native coronary artery with other forms of angina pectoris: Secondary | ICD-10-CM | POA: Diagnosis present

## 2021-05-20 DIAGNOSIS — E782 Mixed hyperlipidemia: Secondary | ICD-10-CM | POA: Diagnosis present

## 2021-05-20 DIAGNOSIS — Z86711 Personal history of pulmonary embolism: Secondary | ICD-10-CM | POA: Diagnosis present

## 2021-05-20 DIAGNOSIS — Z20822 Contact with and (suspected) exposure to covid-19: Secondary | ICD-10-CM | POA: Diagnosis present

## 2021-05-20 DIAGNOSIS — N1831 Chronic kidney disease, stage 3a: Secondary | ICD-10-CM

## 2021-05-20 DIAGNOSIS — R739 Hyperglycemia, unspecified: Secondary | ICD-10-CM | POA: Diagnosis not present

## 2021-05-20 DIAGNOSIS — Z888 Allergy status to other drugs, medicaments and biological substances status: Secondary | ICD-10-CM

## 2021-05-20 DIAGNOSIS — I130 Hypertensive heart and chronic kidney disease with heart failure and stage 1 through stage 4 chronic kidney disease, or unspecified chronic kidney disease: Principal | ICD-10-CM | POA: Diagnosis present

## 2021-05-20 DIAGNOSIS — I25110 Atherosclerotic heart disease of native coronary artery with unstable angina pectoris: Secondary | ICD-10-CM | POA: Diagnosis not present

## 2021-05-20 DIAGNOSIS — Z6832 Body mass index (BMI) 32.0-32.9, adult: Secondary | ICD-10-CM

## 2021-05-20 DIAGNOSIS — M542 Cervicalgia: Secondary | ICD-10-CM

## 2021-05-20 DIAGNOSIS — Z8249 Family history of ischemic heart disease and other diseases of the circulatory system: Secondary | ICD-10-CM

## 2021-05-20 DIAGNOSIS — E785 Hyperlipidemia, unspecified: Secondary | ICD-10-CM | POA: Diagnosis present

## 2021-05-20 DIAGNOSIS — I10 Essential (primary) hypertension: Secondary | ICD-10-CM | POA: Diagnosis present

## 2021-05-20 DIAGNOSIS — I428 Other cardiomyopathies: Secondary | ICD-10-CM | POA: Diagnosis present

## 2021-05-20 DIAGNOSIS — Z794 Long term (current) use of insulin: Secondary | ICD-10-CM

## 2021-05-20 DIAGNOSIS — R0789 Other chest pain: Secondary | ICD-10-CM | POA: Diagnosis not present

## 2021-05-20 DIAGNOSIS — G4733 Obstructive sleep apnea (adult) (pediatric): Secondary | ICD-10-CM

## 2021-05-20 DIAGNOSIS — Z7901 Long term (current) use of anticoagulants: Secondary | ICD-10-CM

## 2021-05-20 DIAGNOSIS — E1122 Type 2 diabetes mellitus with diabetic chronic kidney disease: Secondary | ICD-10-CM

## 2021-05-20 DIAGNOSIS — R590 Localized enlarged lymph nodes: Secondary | ICD-10-CM | POA: Diagnosis present

## 2021-05-20 DIAGNOSIS — E669 Obesity, unspecified: Secondary | ICD-10-CM | POA: Diagnosis present

## 2021-05-20 DIAGNOSIS — I5033 Acute on chronic diastolic (congestive) heart failure: Secondary | ICD-10-CM | POA: Diagnosis present

## 2021-05-20 DIAGNOSIS — E1165 Type 2 diabetes mellitus with hyperglycemia: Secondary | ICD-10-CM | POA: Diagnosis present

## 2021-05-20 DIAGNOSIS — Z79899 Other long term (current) drug therapy: Secondary | ICD-10-CM

## 2021-05-20 DIAGNOSIS — N179 Acute kidney failure, unspecified: Secondary | ICD-10-CM | POA: Diagnosis present

## 2021-05-20 DIAGNOSIS — I340 Nonrheumatic mitral (valve) insufficiency: Secondary | ICD-10-CM | POA: Diagnosis present

## 2021-05-20 DIAGNOSIS — Z833 Family history of diabetes mellitus: Secondary | ICD-10-CM

## 2021-05-20 DIAGNOSIS — Z23 Encounter for immunization: Secondary | ICD-10-CM

## 2021-05-20 DIAGNOSIS — I252 Old myocardial infarction: Secondary | ICD-10-CM

## 2021-05-20 DIAGNOSIS — R778 Other specified abnormalities of plasma proteins: Secondary | ICD-10-CM

## 2021-05-20 DIAGNOSIS — J449 Chronic obstructive pulmonary disease, unspecified: Secondary | ICD-10-CM | POA: Diagnosis present

## 2021-05-20 DIAGNOSIS — Z87891 Personal history of nicotine dependence: Secondary | ICD-10-CM

## 2021-05-20 DIAGNOSIS — Z9989 Dependence on other enabling machines and devices: Secondary | ICD-10-CM

## 2021-05-20 DIAGNOSIS — G8929 Other chronic pain: Secondary | ICD-10-CM | POA: Diagnosis present

## 2021-05-20 DIAGNOSIS — E1169 Type 2 diabetes mellitus with other specified complication: Secondary | ICD-10-CM

## 2021-05-20 DIAGNOSIS — I509 Heart failure, unspecified: Secondary | ICD-10-CM

## 2021-05-20 DIAGNOSIS — E876 Hypokalemia: Secondary | ICD-10-CM | POA: Diagnosis present

## 2021-05-20 DIAGNOSIS — I272 Pulmonary hypertension, unspecified: Secondary | ICD-10-CM | POA: Diagnosis present

## 2021-05-20 HISTORY — DX: Nonrheumatic mitral (valve) insufficiency: I34.0

## 2021-05-20 LAB — RESP PANEL BY RT-PCR (FLU A&B, COVID) ARPGX2
Influenza A by PCR: NEGATIVE
Influenza B by PCR: NEGATIVE
SARS Coronavirus 2 by RT PCR: NEGATIVE

## 2021-05-20 LAB — CBC
HCT: 47.2 % (ref 39.0–52.0)
Hemoglobin: 16.2 g/dL (ref 13.0–17.0)
MCH: 28 pg (ref 26.0–34.0)
MCHC: 34.3 g/dL (ref 30.0–36.0)
MCV: 81.5 fL (ref 80.0–100.0)
Platelets: 162 10*3/uL (ref 150–400)
RBC: 5.79 MIL/uL (ref 4.22–5.81)
RDW: 14.8 % (ref 11.5–15.5)
WBC: 7.9 10*3/uL (ref 4.0–10.5)
nRBC: 0 % (ref 0.0–0.2)

## 2021-05-20 LAB — BASIC METABOLIC PANEL
Anion gap: 16 — ABNORMAL HIGH (ref 5–15)
BUN: 20 mg/dL (ref 6–20)
CO2: 20 mmol/L — ABNORMAL LOW (ref 22–32)
Calcium: 9.2 mg/dL (ref 8.9–10.3)
Chloride: 105 mmol/L (ref 98–111)
Creatinine, Ser: 1.1 mg/dL (ref 0.61–1.24)
GFR, Estimated: >60 mL/min (ref >60)
Glucose, Bld: 298 mg/dL — ABNORMAL HIGH (ref 70–99)
Potassium: 4.1 mmol/L (ref 3.5–5.1)
Sodium: 141 mmol/L (ref 135–145)

## 2021-05-20 LAB — TROPONIN I (HIGH SENSITIVITY)
Troponin I (High Sensitivity): 69 ng/L — ABNORMAL HIGH (ref <18)
Troponin I (High Sensitivity): 60 ng/L — ABNORMAL HIGH (ref <18)

## 2021-05-20 LAB — CBG MONITORING, ED
Glucose-Capillary: 299 mg/dL — ABNORMAL HIGH (ref 70–99)
Glucose-Capillary: 234 mg/dL — ABNORMAL HIGH (ref 70–99)
Glucose-Capillary: 199 mg/dL — ABNORMAL HIGH (ref 70–99)

## 2021-05-20 LAB — BRAIN NATRIURETIC PEPTIDE: B Natriuretic Peptide: 2525.3 pg/mL — ABNORMAL HIGH (ref 0.0–100.0)

## 2021-05-20 LAB — PROCALCITONIN: Procalcitonin: <0.1 ng/mL

## 2021-05-20 LAB — BETA-HYDROXYBUTYRIC ACID: Beta-Hydroxybutyric Acid: 0.46 mmol/L — ABNORMAL HIGH (ref 0.05–0.27)

## 2021-05-20 MED ORDER — RIVAROXABAN 20 MG PO TABS
20.0000 mg | ORAL_TABLET | Freq: Every day | ORAL | Status: DC
  Administered 2021-05-21 – 2021-05-22 (×3): 20 mg via ORAL
  Filled 2021-05-20 (×4): qty 1

## 2021-05-20 MED ORDER — IPRATROPIUM-ALBUTEROL 0.5-2.5 (3) MG/3ML IN SOLN: 3.0000 mL | Freq: Four times a day (QID) | RESPIRATORY_TRACT | Status: DC | PRN

## 2021-05-20 MED ORDER — IOHEXOL 350 MG/ML SOLN: 75.0000 mL | Freq: Once | INTRAVENOUS | Status: DC | PRN

## 2021-05-20 MED ORDER — CARVEDILOL 6.25 MG PO TABS
6.2500 mg | ORAL_TABLET | Freq: Two times a day (BID) | ORAL | Status: DC
  Administered 2021-05-21 – 2021-05-23 (×5): 6.25 mg via ORAL
  Filled 2021-05-20 (×5): qty 1

## 2021-05-20 MED ORDER — LIDOCAINE 5 % EX PTCH
1.0000 | MEDICATED_PATCH | Freq: Two times a day (BID) | CUTANEOUS | Status: DC
  Administered 2021-05-21 (×2): 1 via TRANSDERMAL
  Filled 2021-05-20 (×6): qty 1

## 2021-05-20 MED ORDER — ACETAMINOPHEN 325 MG PO TABS: 650.0000 mg | ORAL_TABLET | Freq: Four times a day (QID) | ORAL | Status: DC | PRN

## 2021-05-20 MED ORDER — FUROSEMIDE 10 MG/ML IJ SOLN
80.0000 mg | Freq: Once | INTRAMUSCULAR | Status: AC
  Administered 2021-05-20: 80 mg via INTRAVENOUS
  Filled 2021-05-20: qty 8

## 2021-05-20 MED ORDER — BISACODYL 5 MG PO TBEC: 5.0000 mg | DELAYED_RELEASE_TABLET | Freq: Every day | ORAL | Status: DC | PRN

## 2021-05-20 MED ORDER — MORPHINE SULFATE (PF) 2 MG/ML IV SOLN: 2.0000 mg | INTRAVENOUS | Status: DC | PRN

## 2021-05-20 MED ORDER — POTASSIUM CHLORIDE CRYS ER 20 MEQ PO TBCR: 20.0000 meq | EXTENDED_RELEASE_TABLET | Freq: Every day | ORAL | Status: DC

## 2021-05-20 MED ORDER — NITROGLYCERIN 0.4 MG SL SUBL
0.4000 mg | SUBLINGUAL_TABLET | SUBLINGUAL | Status: DC | PRN
  Administered 2021-05-20: 0.4 mg via SUBLINGUAL
  Filled 2021-05-20 (×2): qty 1

## 2021-05-20 MED ORDER — ASPIRIN 81 MG PO CHEW
81.0000 mg | CHEWABLE_TABLET | Freq: Once | ORAL | Status: AC
  Administered 2021-05-22: 81 mg via ORAL
  Filled 2021-05-20: qty 1

## 2021-05-20 MED ORDER — METOPROLOL TARTRATE 5 MG/5ML IV SOLN: 5.0000 mg | Freq: Four times a day (QID) | INTRAVENOUS | Status: DC | PRN

## 2021-05-20 MED ORDER — HYDROCODONE-ACETAMINOPHEN 5-325 MG PO TABS: 1.0000 | ORAL_TABLET | ORAL | Status: DC | PRN

## 2021-05-20 MED ORDER — SENNOSIDES-DOCUSATE SODIUM 8.6-50 MG PO TABS: 1.0000 | ORAL_TABLET | Freq: Every evening | ORAL | Status: DC | PRN

## 2021-05-20 MED ORDER — FENOFIBRATE 54 MG PO TABS
54.0000 mg | ORAL_TABLET | Freq: Every day | ORAL | Status: DC
  Administered 2021-05-21 – 2021-05-23 (×3): 54 mg via ORAL
  Filled 2021-05-20 (×3): qty 1

## 2021-05-20 MED ORDER — NITROGLYCERIN 2 % TD OINT
1.0000 [in_us] | TOPICAL_OINTMENT | Freq: Four times a day (QID) | TRANSDERMAL | Status: DC
  Administered 2021-05-20 – 2021-05-21 (×2): 1 [in_us] via TOPICAL
  Filled 2021-05-20 (×3): qty 1

## 2021-05-20 MED ORDER — SACUBITRIL-VALSARTAN 49-51 MG PO TABS
0.5000 | ORAL_TABLET | Freq: Two times a day (BID) | ORAL | Status: DC
  Administered 2021-05-21: 0.5 via ORAL
  Filled 2021-05-20 (×2): qty 0.5

## 2021-05-20 MED ORDER — ACETAMINOPHEN 650 MG RE SUPP
650.0000 mg | Freq: Four times a day (QID) | RECTAL | Status: DC | PRN
  Filled 2021-05-20: qty 1

## 2021-05-20 MED ORDER — INSULIN ASPART 100 UNIT/ML IJ SOLN
0.0000 [IU] | INTRAMUSCULAR | Status: DC
Start: 2021-05-20 — End: 2021-05-23
  Administered 2021-05-20: 3 [IU] via SUBCUTANEOUS
  Administered 2021-05-20: 5 [IU] via SUBCUTANEOUS
  Administered 2021-05-20: 8 [IU] via SUBCUTANEOUS
  Administered 2021-05-21: 3 [IU] via SUBCUTANEOUS
  Administered 2021-05-21: 8 [IU] via SUBCUTANEOUS
  Administered 2021-05-21: 5 [IU] via SUBCUTANEOUS
  Administered 2021-05-21 (×2): 3 [IU] via SUBCUTANEOUS
  Administered 2021-05-21 – 2021-05-22 (×2): 8 [IU] via SUBCUTANEOUS
  Administered 2021-05-22: 5 [IU] via SUBCUTANEOUS
  Administered 2021-05-22: 11 [IU] via SUBCUTANEOUS
  Administered 2021-05-22 (×2): 8 [IU] via SUBCUTANEOUS
  Administered 2021-05-22: 5 [IU] via SUBCUTANEOUS
  Administered 2021-05-23: 3 [IU] via SUBCUTANEOUS
  Administered 2021-05-23: 5 [IU] via SUBCUTANEOUS
  Administered 2021-05-23: 11 [IU] via SUBCUTANEOUS
  Filled 2021-05-20 (×16): qty 1

## 2021-05-20 MED ORDER — EZETIMIBE 10 MG PO TABS
10.0000 mg | ORAL_TABLET | Freq: Every day | ORAL | Status: DC
  Administered 2021-05-21 – 2021-05-23 (×3): 10 mg via ORAL
  Filled 2021-05-20 (×3): qty 1

## 2021-05-20 MED ORDER — ASPIRIN 81 MG PO CHEW
324.0000 mg | CHEWABLE_TABLET | Freq: Once | ORAL | Status: AC
  Administered 2021-05-21: 324 mg via ORAL
  Filled 2021-05-20: qty 4

## 2021-05-20 MED ORDER — ONDANSETRON HCL 4 MG/2ML IJ SOLN: 4.0000 mg | Freq: Four times a day (QID) | INTRAMUSCULAR | Status: DC | PRN

## 2021-05-20 MED ORDER — INSULIN GLARGINE 100 UNIT/ML ~~LOC~~ SOLN
15.0000 [IU] | Freq: Every day | SUBCUTANEOUS | Status: DC
  Filled 2021-05-20: qty 0.15

## 2021-05-20 MED ORDER — HALOPERIDOL LACTATE 5 MG/ML IJ SOLN
5.0000 mg | Freq: Once | INTRAMUSCULAR | Status: AC
  Administered 2021-05-20: 5 mg via INTRAVENOUS
  Filled 2021-05-20: qty 1

## 2021-05-20 MED ORDER — IOHEXOL 350 MG/ML SOLN
100.0000 mL | Freq: Once | INTRAVENOUS | Status: AC | PRN
  Administered 2021-05-20: 100 mL via INTRAVENOUS

## 2021-05-20 MED ORDER — FUROSEMIDE 10 MG/ML IJ SOLN
40.0000 mg | Freq: Two times a day (BID) | INTRAMUSCULAR | Status: DC
  Administered 2021-05-21 – 2021-05-22 (×2): 40 mg via INTRAVENOUS
  Filled 2021-05-20 (×2): qty 4

## 2021-05-20 MED ORDER — ONDANSETRON HCL 4 MG PO TABS
4.0000 mg | ORAL_TABLET | Freq: Four times a day (QID) | ORAL | Status: DC | PRN
Start: 2021-05-20 — End: 2021-05-23

## 2021-05-20 MED ORDER — ROSUVASTATIN CALCIUM 10 MG PO TABS
20.0000 mg | ORAL_TABLET | Freq: Every day | ORAL | Status: DC
  Administered 2021-05-21 – 2021-05-22 (×3): 20 mg via ORAL
  Filled 2021-05-20: qty 1
  Filled 2021-05-20 (×2): qty 2
  Filled 2021-05-20 (×2): qty 1

## 2021-05-20 MED ORDER — FENTANYL CITRATE PF 50 MCG/ML IJ SOSY
50.0000 ug | PREFILLED_SYRINGE | Freq: Once | INTRAMUSCULAR | Status: AC
  Administered 2021-05-20: 50 ug via INTRAVENOUS
  Filled 2021-05-20: qty 1

## 2021-05-20 MED ORDER — LORATADINE 10 MG PO TABS
10.0000 mg | ORAL_TABLET | Freq: Every day | ORAL | Status: DC
  Administered 2021-05-21 – 2021-05-23 (×3): 10 mg via ORAL
  Filled 2021-05-20 (×3): qty 1

## 2021-05-20 NOTE — ED Notes (Signed)
Pt back from CT, assisted to BR with friend

## 2021-05-20 NOTE — ED Provider Notes (Signed)
[facility] Emergency Department Provider Note  ____________________________________________   Event Date/Time   First MD Initiated Contact with Patient 05/20/21 1325     (approximate)  I have reviewed the triage vital signs and the nursing notes.   HISTORY  Chief Complaint Chest Pain   HPI Gabriel [name]. is a 50 y.o. male with past medical history of CAD, CHF, CKD, COPD, HTN, tobacco abuse who presents for assessment of some left-sided chest pain radiating to the right side of his chest and upper back of his neck.  He states it started around 2 AM and woke him from sleep.  He has been out of some of his medicines including his insulin for some time.  No prior similar pain.  It does radiate also into his left arm.  He denies any hemoptysis but states he had a very mild nonproductive cough as well.  No eye pain, vision changes, vertigo, sore throat, abdominal pain, lower back pain, vomiting, diarrhea, dysuria, rash or other extremity pain.  No recent falls or injuries.  States he has been compliant with his Xarelto.  No other acute concerns at this time.         Past Medical History:  Diagnosis Date   CAD (coronary artery disease)    a. 04/2015 low risk MV;  b. 12/2016 Cath: minor irregs in LAD/Diag/LCX/OM, RCA 40p/m/d; c. 05/2020 Cath: LM nl, LAD 50d, LCX 30p, OM1 40, RCA 100p w/ L->L collats. CO/CI 3.1/1.3-->Med rx.   Chest wall pain, chronic    CHF (congestive heart failure) (HCC)    Chronic Troponin Elevation    CKD (chronic kidney disease), stage II-III    COPD (chronic obstructive pulmonary disease) (HCC)    Diabetes mellitus without complication ([place])    HFrEF (heart failure with reduced ejection fraction) ([place])    Hypertension    Resolved since weight loss   Mixed Ischemic & NICM (nonischemic cardiomyopathy) ([place])    a. 03/2015 Echo: EF 45-50%; b. 12/2015 Echo: EF 20-25%; c. 02/2016 Echo: EF 30-35%; d. 11/2016 Echo: EF 40-45%; e. 06/2019 Echo: EF  30-35%; f. 11/2019 Echo: EF 25-30%; g. 05/2020 Echo: EF 35-40%, glob HK, nl RV size/fxn, PASP 44.42mmHg, mod dil LA. Mild to mod MR.   Myocardial infarct (HCC)    NSVT (nonsustained ventricular tachycardia)    a. 12/2015 noted on tele-->amio;  b. 12/2015 Event monitor: no VT. Atach noted.   Obesity (BMI 30.0-34.9)    Psoriasis    Syncope    a. 01/2016 - felt to be vasovagal.   Tobacco abuse     Patient Active Problem List   Diagnosis Date Noted   Acalculous cholecystitis 03/29/2021   Right upper quadrant abdominal pain 03/28/2021   AKI (acute kidney injury) ([place]) 11/24/2020   Acute kidney injury ([place]) 11/23/2020   OSA on CPAP 11/23/2020   Chest pain 10/27/2020   Snoring 08/15/2020   Type II diabetes mellitus with renal manifestations ([place]) 06/11/2020   COPD (chronic obstructive pulmonary disease) ([place]) 06/11/2020   Leukocytosis 06/11/2020   PE (pulmonary thromboembolism) ([place]) 06/11/2020   Hyperglycemia    Elevated brain natriuretic peptide (BNP) level    Uncontrolled type 2 diabetes mellitus with hyperglycemia, with long-term current use of insulin ([place]) 05/13/2020   Acute on chronic HFrEF (heart failure with reduced ejection fraction) ([place]) 05/12/2020   Erectile dysfunction due to arterial insufficiency 03/15/2020   Screening PSA (prostate specific antigen) 03/15/2020   Erectile dysfunction 03/02/2020   Acute  pulmonary embolism ([place]) 01/03/2020   Pulmonary embolism on right [facility])    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[place]) 12/31/2019   Hemoptysis    Costochondritis    Acute respiratory failure with hypoxia (HCC)    Acute on chronic combined systolic (congestive) and diastolic (congestive) heart failure ([place]) 12/20/2019   Acute on chronic heart failure ([place]) 11/22/2019   CKD (chronic kidney disease), stage IIIa 06/26/2019   Type 2 diabetes mellitus with hyperlipidemia ([place]) 06/26/2019   Hypertensive urgency 06/26/2019   Acute on chronic combined systolic and diastolic CHF (congestive heart  failure) ([place]) 09/02/2017   Other chest pain 08/24/2017   Dizziness    Noncompliance with medications 04/29/2017   Back pain 03/06/2017   Tobacco use 12/04/2016   Gallbladder sludge 12/04/2016   Acute on chronic systolic CHF (congestive heart failure) ([place]) 11/28/2016   Coronary artery disease, non-occlusive 03/15/2016   Atypical chest pain 02/16/2016   Orthostatic hypotension 01/23/2016   Hypomagnesemia 75/64/3329   Chronic systolic CHF (congestive heart failure) ([place]) 01/23/2016   NICM (nonischemic cardiomyopathy) ([place]) 01/17/2016   NSVT (nonsustained ventricular tachycardia)    Troponin I above reference range    Hyperlipidemia 05/17/2015   COPD exacerbation ([place]) 05/17/2015   Essential hypertension 03/31/2015   Elevated troponin 03/20/2015    Past Surgical History:  Procedure Laterality Date   AMPUTATION     CARDIAC CATHETERIZATION     FINGER AMPUTATION     Traumatic   FINGER FRACTURE SURGERY Left    LEFT HEART CATH AND CORONARY ANGIOGRAPHY N/A 01/06/2017   Procedure: Left Heart Cath and Coronary Angiography;  Surgeon: Wellington Hampshire, MD;  Location: [facility];  Service: Cardiovascular;  Laterality: N/A;   RIGHT/LEFT HEART CATH AND CORONARY ANGIOGRAPHY N/A 06/13/2020   Procedure: RIGHT/LEFT HEART CATH AND CORONARY ANGIOGRAPHY;  Surgeon: Nelva Bush, MD;  Location: [facility];  Service: Cardiovascular;  Laterality: N/A;    Prior to Admission medications   Medication Sig Start Date End Date Taking? Authorizing Provider  carvedilol (COREG) 6.25 MG tablet Take 1 tablet (6.25 mg total) by mouth 2 (two) times daily with a meal. 08/02/20   Larey Dresser, MD  cetirizine (ZYRTEC) 10 MG tablet Take 10 mg by mouth daily.    [provider]  ezetimibe (ZETIA) 10 MG tablet Take 1 tablet (10 mg total) by mouth daily. 04/04/21   Minna Merritts, MD  fenofibrate (TRICOR) 145 MG tablet Take 1 tablet (145 mg total) by mouth daily. 06/09/20   Lorella Nimrod, MD  Insulin Glargine (BASAGLAR KWIKPEN) 100 UNIT/ML Inject 28 Units into the skin at bedtime. 06/09/20   Lorella Nimrod, MD  insulin lispro (HUMALOG) 100 UNIT/ML KwikPen Inject 3 Units into the skin 3 (three) times daily.    [provider]  Ipratropium-Albuterol (COMBIVENT RESPIMAT) 20-100 MCG/ACT AERS respimat Inhale 1 puff into the lungs every 6 (six) hours as needed for wheezing.    [provider]  lidocaine (LIDODERM) 5 % Place 1 patch onto the skin every 12 (twelve) hours. Remove & Discard patch within 12 hours or as directed by MD Patient not taking: Reported on 04/04/2021 02/15/21 02/15/22  [name], MD  metolazone (ZAROXOLYN) 5 MG tablet TAKE 1 TABLET (5 MG TOTAL) BY MOUTH DAILY AS NEEDED (FOR WEIGHT OVER 245). 05/01/21   Minna Merritts, MD  nitroGLYCERIN (NITROSTAT) 0.4 MG SL tablet Place 1 tablet (0.4 mg total) under the tongue every 5 (five) minutes x 3 doses as needed for chest  pain. 06/09/20   Lorella Nimrod, MD  oxyCODONE-acetaminophen (PERCOCET) 5-325 MG tablet Take 2 tablets by mouth every 6 (six) hours as needed for severe pain. 02/15/21   [name], MD  potassium chloride SA (KLOR-CON M20) 20 MEQ tablet TAKE 1 TABLET (20 MEQ) ONCE A DAY ONLY WHEN TAKING TORSEMIDE 40 MG TWICE DAILY 05/03/21   Minna Merritts, MD  rivaroxaban (XARELTO) 20 MG TABS tablet Take 1 tablet (20 mg total) by mouth daily with supper. 11/07/20   Cammie Sickle, MD  rosuvastatin (CRESTOR) 20 MG tablet Take 1 tablet (20 mg total) by mouth at bedtime. 06/09/20   Lorella Nimrod, MD  sacubitril-valsartan (ENTRESTO) 49-51 MG Take 0.5 tablets by mouth 2 (two) times daily.    [provider]  torsemide (DEMADEX) 20 MG tablet Take 2 tablets (40 mg total) by mouth daily. Take an extra 2 tablets (40MG ) as needed for shortness of breath and/or for weight 242 lb or greater. 04/09/21 07/08/21  Minna Merritts, MD  TRULICITY 7.35 HG/9.9ME SOPN INJECT 0.75 MG INTO THE SKIN ONCE A  WEEK. 09/28/20   Carles Collet M, PA-C    Allergies Metformin and related, Prednisone, and Zantac [ranitidine hcl]  Family History  Problem Relation Age of Onset   Diabetes Mellitus II Mother    Hypothyroidism Mother    Hypertension Mother    Hypertension Father    Gout Father    Cancer Paternal Aunt    Cancer Maternal Grandfather    Diabetes Maternal Grandfather     Social History Social History   Tobacco Use   Smoking status: Former    Packs/day: 0.50    Years: 33.00    Pack years: 16.50    Types: Cigarettes    Quit date: 05/08/2020    Years since quitting: 1.0   Smokeless tobacco: Former    Quit date: 05/08/2020   Tobacco comments:    Quit Sept 2021  Vaping Use   Vaping Use: Former  Substance Use Topics   Alcohol use: Yes    Alcohol/week: 0.0 standard drinks    Comment: occassionally   Drug use: No    Review of Systems  Review of Systems  Constitutional:  Negative for chills and fever.  HENT:  Negative for sore throat.   Eyes:  Negative for pain.  Respiratory:  Positive for shortness of breath. Negative for cough and stridor.   Cardiovascular:  Positive for chest pain.  Gastrointestinal:  Negative for vomiting.  Musculoskeletal:  Positive for neck pain.  Skin:  Negative for rash.  Neurological:  Negative for seizures, loss of consciousness and headaches.  Psychiatric/Behavioral:  Negative for suicidal ideas.   All other systems reviewed and are negative.    ____________________________________________   PHYSICAL EXAM:  VITAL SIGNS: ED Triage Vitals  Enc Vitals Group     BP 05/20/21 1140 (!) 145/95     Pulse Rate 05/20/21 1140 88     Resp 05/20/21 1140 (!) 30     Temp 05/20/21 1140 98 F (36.7 C)     Temp Source 05/20/21 1140 Oral     SpO2 05/20/21 1140 95 %     Weight --      Height --      Head Circumference --      Peak Flow --      Pain Score 05/20/21 1132 10     Pain Loc --      Pain Edu? --      Excl. in  GC? --    Vitals:    05/20/21 1515 05/20/21 1534  BP: (!) 146/92 (!) 147/88  Pulse: 81   Resp: 18   Temp:    SpO2: 97%    Physical Exam Vitals and nursing note reviewed.  Constitutional:      Appearance: He is well-developed.  HENT:     Head: Normocephalic and atraumatic.     Right Ear: External ear normal.     Left Ear: External ear normal.     Nose: Nose normal.  Eyes:     Conjunctiva/sclera: Conjunctivae normal.  Cardiovascular:     Rate and Rhythm: Normal rate and regular rhythm.     Heart sounds: No murmur heard. Pulmonary:     Effort: Pulmonary effort is normal. Tachypnea present. No respiratory distress.     Breath sounds: Examination of the right-lower field reveals rales. Examination of the left-lower field reveals rales. Decreased breath sounds and rales present.  Abdominal:     Palpations: Abdomen is soft.     Tenderness: There is no abdominal tenderness.  Musculoskeletal:     Cervical back: Neck supple.     Right lower leg: Edema present.     Left lower leg: Edema present.  Skin:    General: Skin is warm and dry.  Neurological:     Mental Status: He is alert and oriented to person, place, and time.  Psychiatric:        Mood and Affect: Mood normal.     ____________________________________________   LABS (all labs ordered are listed, but only abnormal results are displayed)  Labs Reviewed  BASIC METABOLIC PANEL - Abnormal; Notable for the following components:      Result Value   CO2 20 (*)    Glucose, Bld 298 (*)    Anion gap 16 (*)    All other components within normal limits  BRAIN NATRIURETIC PEPTIDE - Abnormal; Notable for the following components:   B Natriuretic Peptide 2,525.3 (*)    All other components within normal limits  CBG MONITORING, ED - Abnormal; Notable for the following components:   Glucose-Capillary 299 (*)    All other components within normal limits  TROPONIN I (HIGH SENSITIVITY) - Abnormal; Notable for the following components:   Troponin I  (High Sensitivity) 69 (*)    All other components within normal limits  TROPONIN I (HIGH SENSITIVITY) - Abnormal; Notable for the following components:   Troponin I (High Sensitivity) 60 (*)    All other components within normal limits  RESP PANEL BY RT-PCR (FLU A&B, COVID) ARPGX2  CBC   ____________________________________________  EKG  ECG remarkable for sinus rhythm with a ventricular rate of 88, right axis deviation, and no clear evidence of acute ischemia or other significant arrhythmia.  QTc interval is 488. ____________________________________________  RADIOLOGY  ED MD interpretation: Chest x-ray has evidence of cardiomegaly and mild pulm edema bilaterally.  No clear focal consolidation, effusion, pneumothorax or other clear acute thoracic process.  Official radiology report(s): DG Chest 2 View  Result Date: 05/20/2021 CLINICAL DATA:  Shortness of breath.  Pain. EXAM: CHEST - 2 VIEW COMPARISON:  March 28, 2021 FINDINGS: Mild cardiomegaly. The hila and mediastinum are unremarkable. Increased interstitial markings, more prominent compared to March 01, 2021 but decreased since March 28, 2021. No pneumothorax. No nodules or masses. No other acute abnormalities. IMPRESSION: Findings are most consistent with mild cardiomegaly and mild pulmonary edema. Electronically Signed   By: Dorise Bullion III M.D.  On: 05/20/2021 12:52    ____________________________________________   PROCEDURES  Procedure(s) performed (including Critical Care):  .1-3 Lead EKG Interpretation Performed by: Lucrezia Starch, MD Authorized by: Lucrezia Starch, MD     Interpretation: non-specific     ECG rate assessment: normal     Rhythm: sinus rhythm     Ectopy: none     Conduction: normal     ____________________________________________   INITIAL IMPRESSION / ASSESSMENT AND PLAN / ED COURSE      Patient presents with above to history exam for assessment of left-sided chest pain rating to the  right and up towards the right side of his neck.  This is associate with some shortness of breath.  On arrival he is hypertensive with otherwise stable vital signs on room air.  Differential includes dissection, ACS, anemia, pericarditis, myocarditis, costochondritis, pleurisy, anemia and metabolic derangements.  Patient also appears volume overloaded on exam and CHF is in the differential for his shortness of breath.  Chest x-ray has evidence of cardiomegaly and mild pulm edema bilaterally.  No clear focal consolidation, effusion, pneumothorax or other clear acute thoracic process.  BNP is 2525 and given findings on x-ray and exam concerning for volume overload did give patient IV dose of Lasix.  ECG without clear ischemia and troponin is downtrending from 69-60.  This is compared to 71 and 23-month ago.  I suspect some chronic troponinemia versus mild demand ischemia with low suspicion for other ACS at this time.  COVID influenza PCR is negative.  Low suspicion for PE as patient states he is compliant with Xarelto.  CBC shows no leukocytosis or acute anemia.  BMP remarkable for hyperglycemia with a glucose of 298 without other significant derangements.  I will plan to obtain a CTA chest to rule out dissection and following this if patient is able to be diuresed in the emergency room and is feeling better he can likely be discharged with close outpatient follow-up.  If he is still short of breath and x-rays of chest pain will be reasonable to admit him for CHF exacerbation.        ____________________________________________   FINAL CLINICAL IMPRESSION(S) / ED DIAGNOSES  Final diagnoses:  Congestive heart failure, unspecified HF chronicity, unspecified heart failure type (HCC)  Troponin I above reference range  Hyperglycemia  Anticoagulated    Medications  insulin aspart (novoLOG) injection 0-15 Units (8 Units Subcutaneous Given 05/20/21 1403)  nitroGLYCERIN (NITROSTAT) SL tablet 0.4 mg  (0.4 mg Sublingual Given 05/20/21 1527)  fentaNYL (SUBLIMAZE) injection 50 mcg (50 mcg Intravenous Given 05/20/21 1511)  furosemide (LASIX) injection 80 mg (80 mg Intravenous Given 05/20/21 1512)  iohexol (OMNIPAQUE) 350 MG/ML injection 100 mL (100 mLs Intravenous Contrast Given 05/20/21 1539)     ED Discharge Orders     None        Note:  This document was prepared using Dragon voice recognition software and may include unintentional dictation errors.    Lucrezia Starch, MD 05/20/21 [phone]

## 2021-05-20 NOTE — ED Triage Notes (Signed)
Pt comes with c/o CP that started 2am and woke him up from his sleep. Pt states some SOb. Pt states 10/10 pain. Pt states pain in left arm.  Pt given 324 aspirin, 2 nitro  BP-163/102 CBG-334  20 g IV  Pt has hx of MI

## 2021-05-20 NOTE — H&P (Signed)
History and Physical    Gabriel [name]. XTK:[id number] DOB: 1970-10-11 DOA: 05/20/2021  PCP: Virginia Crews, MD  Cardiologist: [name], in [place] at [facility]  Patient coming from: home  I have personally briefly reviewed patient's old medical records in [facility].  Chief Complaint: Chest pain  HPI: Gabriel [name]. is a 50 y.o. 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 He woke up on 05/20/21 at 2 am with severe chest pain that eventually resolved but returned later in the morning when he was taking a shower. Pain is located in the substernal and left chest areas, radiates to left arm, is up to 10/10 at times and is characterized as sharp and also a tightness. Symptoms are alleviated slightly by sitting/resting and exacerbated by walking/exertion. Associated symptoms: He had shortness of breath and mild nausea with the chest pain but no diaphoresis. He developed a pain in the back of his neck that went up to the back of his head; he continues to have a headache on admission. Mild cough that is nonproductive. No wheezing. Intermittent palpitations. Feels cold but no fever or chills. Mild leg swelling. Weight is usually 235 lbs; 2 weeks prior to admission he had to add furosemide due to swelling a weight gain but it improved.  At the time of admission the patient still has chest pain but it is decreased.  ED Course: Labs include BNP 2525, first troponin  69, second troponin 60, glucose 298, procalcitonin <0.1, negative SARS-CoV-2 PCR, and negative influenza A and B.  Patient was afebrile but had frequent tachypnea up to 30 breaths/min, and O2 saturation 90%.  Chest x-ray showed mild cardiomegaly and pulmonary edema.  CTA of the chest showed bilateral patchy groundglass consolidation that is nonspecific likely pulmonary edema but atypical infection not excluded; small bilateral pleural effusions; persistent mediastinal adenopathy and a 2 cm node in the right infrahilar region thought to be reactive in etiology but follow-up is recommended.  Patient was given furosemide 80 mg IV x1.  He was given fentanyl IV x1 for pain.  He did mention a headache and was given Haldol for the headache.   History:  Echocardiogram, 06/08/2020, [name]: IMPRESSIONS   1. Left ventricular ejection fraction, by estimation, is 35 to 40%. The  left ventricle has moderately decreased function. The left ventricle  demonstrates global hypokinesis. The left ventricular internal cavity size  was moderately dilated. Left  ventricular diastolic parameters are indeterminate.   2. Right ventricular systolic function is normal. The right ventricular  size is normal. There is mildly elevated pulmonary artery systolic  pressure. The estimated right ventricular systolic pressure is 99.2 mmHg.   3. Left atrial size was moderately dilated.   4. Mild to moderate mitral valve regurgitation.    Cardiac catheterization, 06/08/2020: Conclusions: Severe single-vessel coronary artery disease with chronic total occlusion of the proximal RCA.  The distal vessel fills via left to right collaterals. Mild to moderate, nonobstructive CAD involving the LAD and LCx with up to 50% stenosis. Mildly elevated left and right heart filling pressures.  Moderate pulmonary hypertension (mean PAP 40 mmHg) with significantly elevated pulmonary vascular resistance (PVR 7.4 WU). Severely reduced Fick cardiac  output/index. Recommendations: Continue IV diuresis for at least 1 more day. Given severely reduced cardiac output, I will decrease carvedilol to 6.5 mg twice daily.  If blood pressure tolerates and renal function is stable, consider further escalation of Entresto. Restart IV heparin 2 hours after TR band removal.  If no evidence of bleeding or vascular complication, the patient can be transitioned back to [facility] tomorrow. 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  He was evaluated by Dr. Aundra Dubin in June but failed to follow-up since then. Aggressive secondary prevention of coronary artery disease.  Favor medical management of CTO of RCA, which is new since 2018. Nelva Bush, MD [facility]    Review of Systems: As per HPI otherwise all other systems reviewed and are unremarable. GI: No abdominal pain, nausea, vomiting, diarrhea, constipation, or bloody stool.  NEUROLOGICAL: Headache in posterior head. No focal weakness.    Past Medical History:  Diagnosis Date   CAD (coronary artery disease)    a. 04/2015 low risk MV;  b. 12/2016 Cath: minor irregs in LAD/Diag/LCX/OM, RCA 40p/m/d; c. 05/2020 Cath: LM nl, LAD 50d, LCX 30p, OM1 40, RCA 100p w/ L->L collats. CO/CI 3.1/1.3-->Med rx.   Chest wall pain, chronic    CHF (congestive heart failure) (HCC)    Chronic Troponin Elevation    CKD (chronic kidney disease), stage II-III    COPD (chronic obstructive pulmonary disease) (HCC)    Diabetes mellitus without complication (HCC)    HFrEF (heart failure with reduced ejection fraction) ([place])    Hypertension    Mitral regurgitation    Mild to moderate by October 2021 echocardiogram.   Mixed Ischemic & NICM (nonischemic cardiomyopathy) ([place])    a. 03/2015 Echo: EF 45-50%; b. 12/2015 Echo: EF 20-25%; c. 02/2016 Echo: EF 30-35%; d. 11/2016 Echo: EF 40-45%; e.  06/2019 Echo: EF 30-35%; f. 11/2019 Echo: EF 25-30%; g. 05/2020 Echo: EF 35-40%, glob HK, nl RV size/fxn, PASP 44.66mmHg, mod dil LA. Mild to mod MR.   Myocardial infarct (HCC)    NSVT (nonsustained ventricular tachycardia)    a. 12/2015 noted on tele-->amio;  b. 12/2015 Event monitor: no VT noted.   Obesity (BMI 30.0-34.9)    Psoriasis    Recurrent pulmonary emboli ([place]) 06/07/2020   06/07/20: small bilateral PEs.  12/31/19: RUL and RLL PEs.   Syncope    a. 01/2016 - felt to be vasovagal.    Past Surgical History:  Procedure Laterality Date   AMPUTATION     CARDIAC CATHETERIZATION     FINGER AMPUTATION     Traumatic   FINGER FRACTURE SURGERY Left    LEFT HEART CATH AND CORONARY ANGIOGRAPHY N/A 01/06/2017   Procedure: Left Heart Cath and Coronary Angiography;  Surgeon: Wellington Hampshire, MD;  Location: [place] [facility];  Service: Cardiovascular;  Laterality: N/A;   RIGHT/LEFT HEART CATH AND CORONARY ANGIOGRAPHY N/A 06/13/2020   Procedure: RIGHT/LEFT HEART CATH AND CORONARY ANGIOGRAPHY;  Surgeon: Nelva Bush, MD;  Location: [place] [facility];  Service: Cardiovascular;  Laterality: N/A;    Social History  reports that he quit smoking about a year ago. His smoking use included cigarettes. He has a 16.50 pack-year smoking history. He quit smokeless tobacco use about a year ago. He reports current alcohol use. He  reports that he does not use drugs.  Allergies  Allergen Reactions   Metformin And Related Nausea And Vomiting   Prednisone Other (See Comments)    Reaction: Hallucinations     Zantac [Ranitidine Hcl] Diarrhea and Nausea Only    Night sweats    Family History  Problem Relation Age of Onset   Diabetes Mellitus II Mother    Hypothyroidism Mother    Hypertension Mother    Hypertension Father    Gout Father    Cancer Paternal Aunt    Cancer Maternal Grandfather    Diabetes Maternal Grandfather      Home Medications  Prior to Admission medications    Medication Sig Start Date End Date Taking? Authorizing Provider  carvedilol (COREG) 6.25 MG tablet Take 1 tablet (6.25 mg total) by mouth 2 (two) times daily with a meal. 08/02/20  Yes Larey Dresser, MD  ezetimibe (ZETIA) 10 MG tablet Take 1 tablet (10 mg total) by mouth daily. 04/04/21  Yes Minna Merritts, MD  fenofibrate (TRICOR) 145 MG tablet Take 1 tablet (145 mg total) by mouth daily. 06/09/20  Yes Lorella Nimrod, MD  Insulin Glargine (BASAGLAR KWIKPEN) 100 UNIT/ML Inject 28 Units into the skin at bedtime. 06/09/20  Yes Lorella Nimrod, MD  insulin lispro (HUMALOG) 100 UNIT/ML KwikPen Inject 3 Units into the skin 3 (three) times daily.   Yes [provider]  rivaroxaban (XARELTO) 20 MG TABS tablet Take 1 tablet (20 mg total) by mouth daily with supper. 11/07/20  Yes Cammie Sickle, MD  rosuvastatin (CRESTOR) 20 MG tablet Take 1 tablet (20 mg total) by mouth at bedtime. 06/09/20  Yes Lorella Nimrod, MD  sacubitril-valsartan (ENTRESTO) 49-51 MG Take 0.5 tablets by mouth 2 (two) times daily.   Yes [provider]  torsemide (DEMADEX) 20 MG tablet Take 2 tablets (40 mg total) by mouth daily. Take an extra 2 tablets (40MG ) as needed for shortness of breath and/or for weight 242 lb or greater. 04/09/21 07/08/21 Yes Gollan, Kathlene November, MD  cetirizine (ZYRTEC) 10 MG tablet Take 10 mg by mouth daily.    [provider]  Ipratropium-Albuterol (COMBIVENT RESPIMAT) 20-100 MCG/ACT AERS respimat Inhale 1 puff into the lungs every 6 (six) hours as needed for wheezing.    [provider]  lidocaine (LIDODERM) 5 % Place 1 patch onto the skin every 12 (twelve) hours. Remove & Discard patch within 12 hours or as directed by MD Patient not taking: Reported on 04/04/2021 02/15/21 02/15/22  [name], MD  metolazone (ZAROXOLYN) 5 MG tablet TAKE 1 TABLET (5 MG TOTAL) BY MOUTH DAILY AS NEEDED (FOR WEIGHT OVER 245). 05/01/21   Minna Merritts, MD  nitroGLYCERIN (NITROSTAT) 0.4  MG SL tablet Place 1 tablet (0.4 mg total) under the tongue every 5 (five) minutes x 3 doses as needed for chest pain. 06/09/20   Lorella Nimrod, MD  oxyCODONE-acetaminophen (PERCOCET) 5-325 MG tablet Take 2 tablets by mouth every 6 (six) hours as needed for severe pain. Patient not taking: Reported on 05/20/2021 02/15/21   [name], MD  potassium chloride SA (KLOR-CON M20) 20 MEQ tablet TAKE 1 TABLET (20 MEQ) ONCE A DAY ONLY WHEN TAKING TORSEMIDE 40 MG TWICE DAILY 05/03/21   Minna Merritts, MD  TRULICITY 0.25 KY/7.0WC SOPN INJECT 0.75 MG INTO THE SKIN ONCE A WEEK. 09/28/20   Trinna Post, PA-C    Physical Exam:  Vitals:   05/20/21 1534 05/20/21 1704 05/20/21 2000 05/20/21 2030  BP: (!) 147/88 122/63 127/76 103/78  Pulse:  82 70 65  Resp:  18 14 (!) 27  Temp:      TempSrc:      SpO2:  100% 90% 97%    Constitutional: calm, obese, ill-appearing. Respiratory rate: 14-32 Vitals:   05/20/21 1534 05/20/21 1704 05/20/21 2000 05/20/21 2030  BP: (!) 147/88 122/63 127/76 103/78  Pulse:  82 70 65  Resp:  18 14 (!) 27  Temp:      TempSrc:      SpO2:  100% 90% 97%   Eyes: Pupils equal and round, lids and conjunctivae without icterus or erythema. ENMT: Mucous membranes are dry. Posterior pharynx clear of any exudate or lesions. Nares patent without discharge or bleeding.  Normocephalic, atraumatic.  Normal dentition.  Neck: normal, supple, no masses, trachea midline.  Thyroid nontender, no masses appreciated, no thyromegaly.  Large neck circumference. Respiratory: clear to auscultation bilaterally. Chest wall movements are symmetric. No wheezing.  No rhonchi. No accessory muscle use.  Tachypnea when patient sits up in bed.  Slight rales in the bases bilaterally. Cardiovascular: Regular rate and rhythm, no murmurs / rubs / gallops. Pulses: DP pulses 2+ bilaterally. No carotid bruits.  Capillary refill less than 3 seconds. Edema: None bilaterally.  JVD. GI: soft, non-distended, normal  active bowel sounds. No hepatosplenomegaly. No rigidity, rebound, or guarding. Non-tender. No masses palpated.  Musculoskeletal: no clubbing / cyanosis. No joint deformity upper and lower extremities. Good ROM, no contractures. Normal muscle tone.  No tenderness or deformity in the back bilaterally. Integument: no rashes, lesions, ulcers. No induration. Clean, dry, intact. Neurologic: CN 2-12 grossly intact. Sensation grossly intact to light touch. DTR 2+ bilaterally.  Babinski: Toes downgoing bilaterally.  Strength 5/5 in all 4.  Intact rapid alternating movements bilaterally.  No pronator drift. Psychiatric: Normal judgment and insight. Alert and oriented x 3. Normal mood.  Normal and appropriate affect. Lymphatic: No cervical lymphadenopathy. No supraclavicular lymphadenopathy.   Labs on Admission: I have personally reviewed the following labs and imaging studies.  CBC: Recent Labs  Lab 05/20/21 1143  WBC 7.9  HGB 16.2  HCT 47.2  MCV 81.5  PLT 053    Basic Metabolic Panel: Recent Labs  Lab 05/20/21 1143  NA 141  K 4.1  CL 105  CO2 20*  GLUCOSE 298*  BUN 20  CREATININE 1.10  CALCIUM 9.2     Radiological Exams on Admission: DG Chest 2 View  Result Date: 05/20/2021 CLINICAL DATA:  Shortness of breath.  Pain. EXAM: CHEST - 2 VIEW COMPARISON:  March 28, 2021 FINDINGS: Mild cardiomegaly. The hila and mediastinum are unremarkable. Increased interstitial markings, more prominent compared to March 01, 2021 but decreased since March 28, 2021. No pneumothorax. No nodules or masses. No other acute abnormalities. IMPRESSION: Findings are most consistent with mild cardiomegaly and mild pulmonary edema. Electronically Signed   By: Dorise Bullion III M.D.   On: 05/20/2021 12:52   CT Angio Chest Aorta W and/or Wo Contrast  Result Date: 05/20/2021 CLINICAL DATA:  Chest pain.  Shortness of breath. EXAM: CT ANGIOGRAPHY CHEST WITH CONTRAST TECHNIQUE: Multidetector CT imaging of the chest  was performed using the standard protocol during bolus administration of intravenous contrast. Multiplanar CT image reconstructions and MIPs were obtained to evaluate the vascular anatomy. CONTRAST:  165mL OMNIPAQUE IOHEXOL 350 MG/ML SOLN COMPARISON:  Chest radiograph earlier same day. FINDINGS: Cardiovascular: The heart is enlarged. Normal caliber thoracic aorta. Adequate opacification of pulmonary arterial system.  No evidence for acute pulmonary embolus. Mediastinum/Nodes: Multiple prominent subcentimeter mediastinal lymph nodes are redemonstrated. 2 cm right infrahilar (image 46; series 4) similar to prior. Normal appearance of the esophagus. Lungs/Pleura: Central airways are patent. Patchy ground-glass opacities within the lungs bilaterally. Small bilateral pleural effusions. No pneumothorax. Upper Abdomen: No acute process. Musculoskeletal: Thoracic spine degenerative changes. No aggressive or acute appearing osseous lesions. Review of the MIP images confirms the above findings. IMPRESSION: No acute pulmonary embolus. Scattered bilateral patchy areas of ground-glass consolidation, nonspecific. Possibility of pulmonary edema or atypical infection not excluded Small bilateral pleural effusions. Persistent mediastinal adenopathy, predominately subcentimeter in size. Additionally there is a 2 cm node within the right infrahilar region. These are likely reactive in etiology. Recommend attention on follow-up. Electronically Signed   By: Lovey Newcomer M.D.   On: 05/20/2021 16:11    EKG: Independently reviewed.  88 bpm.  Normal sinus rhythm.    Assessment/Plan  Principal Problem:    Atherosclerosis of native coronary artery of native heart with unstable angina pectoris  Has known CAD with total occlusion of RCA with nonocclusive stenosis of other arteries but has never had a stent placed.  Followed by [name]. Plan: Telemetry.  Trend troponin levels. Continue home beta-blocker, carvedilol. Continue home  Entresto. Aspirin 324 mg x 1, then daily aspirin. Nitroglycerin paste scheduled and sublingual tablets prn. Statin. Measure lipid panel. Oxygen support by nasal cannula as needed. Morphine PRN. Anticoagulation with home anticoagulant, Xarelto. If chest pain persists, consider cardiology consult.   Active Problems:  Acute on chronic systolic CHF (congestive heart failure) (HCC) Had tachypnea with minimal effort while on stretcher. O2 sat to 90% sitting. Echocardiogram, 06/08/2020, [name]: IMPRESSIONS   1. Left ventricular ejection fraction, by estimation, is 35 to 40%. The  left ventricle has moderately decreased function. The left ventricle  demonstrates global hypokinesis. The left ventricular internal cavity size  was moderately dilated. Left  ventricular diastolic parameters are indeterminate.   2. Right ventricular systolic function is normal. The right ventricular  size is normal. There is mildly elevated pulmonary artery systolic  pressure. The estimated right ventricular systolic pressure is 62.7 mmHg.   3. Left atrial size was moderately dilated.   4. Mild to moderate mitral valve regurgitation.  Plan: CHF order set. Continue home carvedilol and Entresto. Lasix IV scheduled. Strict I/O. 2g Na diet. Oxygen support by nasal cannula as needed. Instruct patient regarding CHF diagnosis. Instruct patient to weigh self daily and to contact doctor's office and/or follow doctor's plan if there is a weight gain of 2-3 lbs in 1 day or 5 lbs in 3-7 days. Provide teaching regarding 2g Na diet.     Type 2 diabetes mellitus with hyperglycemia (HCC) Plan: Check fingerstick blood sugars.  Sliding scale insulin.   Continue home long-acting insulin but at a lower dose because patient's oral intake may be different while in the hospital.    COPD (chronic obstructive pulmonary disease) ([place]) No wheezing on exam so doubt exacerbation. Plan: Albuterol or DuoNeb nebulizer treatments  as needed.    OSA on CPAP Plan: CPAP at night.    History of pulmonary embolism and  Long term current use of anticoagulant History of pulmonary emboli in May and October 2021. Plan: Continue home Xarelto.    Mitral regurgitation Noted on echocardiogram October 2021. Plan: Outpatient follow-up with cardiologist.    Essential hypertension Plan: Continue home beta-blocker and Entresto.    Mixed hyperlipidemia With associated diabetes and hypertension.  Plan: Check lipid panel.  Continue home statin.    Mediastinal lymphadenopathy Noted on CTA of the chest "Persistent mediastinal adenopathy, predominately subcentimeter in size. Additionally there is a 2 cm node within the right infrahilar region. These are likely reactive in etiology. Recommend attention on follow-up."  Patient had mediastinal lymphadenopathy also noted on previous CTs of the chest. Plan: Outpatient follow-up with primary care.     DVT prophylaxis: Xarelto.  Code Status:   Full Code   Disposition Plan:   Patient is from:  Home  Anticipated DC to:  Home  Anticipated DC date:  05/21/2021  Anticipated DC barriers: Degree of pain.  Consults called:  None Admission status:  Observation  Severity of Illness: The appropriate patient status for this patient is OBSERVATION. Observation status is judged to be reasonable and necessary in order to provide the required intensity of service to ensure the patient's safety. The patient's presenting symptoms, physical exam findings, and initial radiographic and laboratory data in the context of their medical condition is felt to place them at decreased risk for further clinical deterioration. Furthermore, it is anticipated that the patient will be medically stable for discharge from the hospital within 2 midnights of admission. The following factors support the patient status of observation.   " The patient's presenting symptoms include chest pain and shortness of breath. " The  physical exam findings include tachypnea, rales. " The initial radiographic and laboratory data are notable for markedly elevated BNP, troponin above reference range, imaging suggestive of CHF.    Tacey Ruiz MD [facility]  How to contact the [facility] Attending or Consulting provider [place] or covering provider during after hours [place], for this patient?   Check the care team in [facility] and look for a) attending/consulting [facility] provider listed and b) the [facility] team listed Log into [email or web address] and use [place]'s universal password to access. If you do not have the password, please contact the hospital operator. Locate the [facility] provider you are looking for under [facility] and page to a number that you can be directly reached. If you still have difficulty reaching the provider, please page the [facility] (Director on Call) for the Hospitalists listed on amion for assistance.  05/20/2021, 9:06 PM

## 2021-05-20 NOTE — ED Notes (Signed)
PT endorsing  chest pain as well as pain in posterior neck radiating to head. MD made aware awaiting order.

## 2021-05-20 NOTE — ED Notes (Signed)
Pt assisted to BR with wheelchair

## 2021-05-21 ENCOUNTER — Encounter: Payer: Self-pay | Admitting: Internal Medicine

## 2021-05-21 ENCOUNTER — Inpatient Hospital Stay: Payer: Medicaid Other

## 2021-05-21 ENCOUNTER — Observation Stay (HOSPITAL_COMMUNITY)
Admit: 2021-05-21 | Discharge: 2021-05-21 | Disposition: A | Discharge status: Home / Self Care | Payer: Medicaid Other | Attending: Internal Medicine | Admitting: Internal Medicine

## 2021-05-21 DIAGNOSIS — Z20822 Contact with and (suspected) exposure to covid-19: Secondary | ICD-10-CM | POA: Diagnosis not present

## 2021-05-21 DIAGNOSIS — I5033 Acute on chronic diastolic (congestive) heart failure: Secondary | ICD-10-CM | POA: Diagnosis present

## 2021-05-21 DIAGNOSIS — E669 Obesity, unspecified: Secondary | ICD-10-CM | POA: Diagnosis not present

## 2021-05-21 DIAGNOSIS — I5031 Acute diastolic (congestive) heart failure: Secondary | ICD-10-CM | POA: Diagnosis not present

## 2021-05-21 DIAGNOSIS — I130 Hypertensive heart and chronic kidney disease with heart failure and stage 1 through stage 4 chronic kidney disease, or unspecified chronic kidney disease: Secondary | ICD-10-CM | POA: Diagnosis not present

## 2021-05-21 DIAGNOSIS — I5023 Acute on chronic systolic (congestive) heart failure: Secondary | ICD-10-CM | POA: Diagnosis not present

## 2021-05-21 DIAGNOSIS — E119 Type 2 diabetes mellitus without complications: Secondary | ICD-10-CM | POA: Insufficient documentation

## 2021-05-21 DIAGNOSIS — R739 Hyperglycemia, unspecified: Secondary | ICD-10-CM | POA: Diagnosis not present

## 2021-05-21 DIAGNOSIS — I252 Old myocardial infarction: Secondary | ICD-10-CM | POA: Diagnosis not present

## 2021-05-21 DIAGNOSIS — Z888 Allergy status to other drugs, medicaments and biological substances status: Secondary | ICD-10-CM | POA: Diagnosis not present

## 2021-05-21 DIAGNOSIS — Z86711 Personal history of pulmonary embolism: Secondary | ICD-10-CM

## 2021-05-21 DIAGNOSIS — N1831 Chronic kidney disease, stage 3a: Secondary | ICD-10-CM | POA: Diagnosis not present

## 2021-05-21 DIAGNOSIS — J449 Chronic obstructive pulmonary disease, unspecified: Secondary | ICD-10-CM | POA: Diagnosis not present

## 2021-05-21 DIAGNOSIS — Z833 Family history of diabetes mellitus: Secondary | ICD-10-CM | POA: Diagnosis not present

## 2021-05-21 DIAGNOSIS — E1165 Type 2 diabetes mellitus with hyperglycemia: Secondary | ICD-10-CM | POA: Diagnosis not present

## 2021-05-21 DIAGNOSIS — I25110 Atherosclerotic heart disease of native coronary artery with unstable angina pectoris: Secondary | ICD-10-CM | POA: Diagnosis not present

## 2021-05-21 DIAGNOSIS — I272 Pulmonary hypertension, unspecified: Secondary | ICD-10-CM | POA: Diagnosis not present

## 2021-05-21 DIAGNOSIS — Z23 Encounter for immunization: Secondary | ICD-10-CM | POA: Diagnosis not present

## 2021-05-21 DIAGNOSIS — I428 Other cardiomyopathies: Secondary | ICD-10-CM | POA: Diagnosis not present

## 2021-05-21 DIAGNOSIS — I340 Nonrheumatic mitral (valve) insufficiency: Secondary | ICD-10-CM | POA: Diagnosis not present

## 2021-05-21 DIAGNOSIS — E1122 Type 2 diabetes mellitus with diabetic chronic kidney disease: Secondary | ICD-10-CM | POA: Diagnosis not present

## 2021-05-21 DIAGNOSIS — Z6832 Body mass index (BMI) 32.0-32.9, adult: Secondary | ICD-10-CM | POA: Diagnosis not present

## 2021-05-21 DIAGNOSIS — E1169 Type 2 diabetes mellitus with other specified complication: Secondary | ICD-10-CM | POA: Diagnosis not present

## 2021-05-21 DIAGNOSIS — N179 Acute kidney failure, unspecified: Secondary | ICD-10-CM | POA: Diagnosis not present

## 2021-05-21 DIAGNOSIS — G4733 Obstructive sleep apnea (adult) (pediatric): Secondary | ICD-10-CM | POA: Diagnosis present

## 2021-05-21 DIAGNOSIS — Z87891 Personal history of nicotine dependence: Secondary | ICD-10-CM | POA: Diagnosis not present

## 2021-05-21 DIAGNOSIS — E782 Mixed hyperlipidemia: Secondary | ICD-10-CM | POA: Diagnosis not present

## 2021-05-21 DIAGNOSIS — Z8249 Family history of ischemic heart disease and other diseases of the circulatory system: Secondary | ICD-10-CM | POA: Diagnosis not present

## 2021-05-21 DIAGNOSIS — Z7901 Long term (current) use of anticoagulants: Secondary | ICD-10-CM | POA: Diagnosis not present

## 2021-05-21 LAB — ECHOCARDIOGRAM COMPLETE
AR max vel: 3.64 cm2
AV Area VTI: 3.64 cm2
AV Area mean vel: 3.4 cm2
AV Mean grad: 3 mmHg
AV Peak grad: 4.8 mmHg
Ao pk vel: 1.09 m/s
Area-P 1/2: 3.68 cm2
MV VTI: 3.47 cm2
S' Lateral: 5 cm

## 2021-05-21 LAB — CBC
HCT: 49.1 % (ref 39.0–52.0)
Hemoglobin: 17 g/dL (ref 13.0–17.0)
MCH: 28.1 pg (ref 26.0–34.0)
MCHC: 34.6 g/dL (ref 30.0–36.0)
MCV: 81 fL (ref 80.0–100.0)
Platelets: 179 10*3/uL (ref 150–400)
RBC: 6.06 MIL/uL — ABNORMAL HIGH (ref 4.22–5.81)
RDW: 14.9 % (ref 11.5–15.5)
WBC: 8.3 10*3/uL (ref 4.0–10.5)
nRBC: 0 % (ref 0.0–0.2)

## 2021-05-21 LAB — BASIC METABOLIC PANEL
Anion gap: 8 (ref 5–15)
BUN: 21 mg/dL — ABNORMAL HIGH (ref 6–20)
CO2: 28 mmol/L (ref 22–32)
Calcium: 8.6 mg/dL — ABNORMAL LOW (ref 8.9–10.3)
Chloride: 104 mmol/L (ref 98–111)
Creatinine, Ser: 1.08 mg/dL (ref 0.61–1.24)
GFR, Estimated: >60 mL/min (ref >60)
Glucose, Bld: 164 mg/dL — ABNORMAL HIGH (ref 70–99)
Potassium: 3.1 mmol/L — ABNORMAL LOW (ref 3.5–5.1)
Sodium: 140 mmol/L (ref 135–145)

## 2021-05-21 LAB — GLUCOSE, CAPILLARY
Glucose-Capillary: 171 mg/dL — ABNORMAL HIGH (ref 70–99)
Glucose-Capillary: 270 mg/dL — ABNORMAL HIGH (ref 70–99)

## 2021-05-21 LAB — CBG MONITORING, ED
Glucose-Capillary: 160 mg/dL — ABNORMAL HIGH (ref 70–99)
Glucose-Capillary: 167 mg/dL — ABNORMAL HIGH (ref 70–99)
Glucose-Capillary: 227 mg/dL — ABNORMAL HIGH (ref 70–99)
Glucose-Capillary: 278 mg/dL — ABNORMAL HIGH (ref 70–99)

## 2021-05-21 LAB — TROPONIN I (HIGH SENSITIVITY)
Troponin I (High Sensitivity): 54 ng/L — ABNORMAL HIGH (ref <18)
Troponin I (High Sensitivity): 55 ng/L — ABNORMAL HIGH (ref <18)

## 2021-05-21 LAB — MAGNESIUM: Magnesium: 1.6 mg/dL — ABNORMAL LOW (ref 1.7–2.4)

## 2021-05-21 MED ORDER — PERFLUTREN LIPID MICROSPHERE
1.0000 mL | INTRAVENOUS | Status: AC | PRN
  Administered 2021-05-21: 4 mL via INTRAVENOUS
  Filled 2021-05-21: qty 10

## 2021-05-21 MED ORDER — POTASSIUM CHLORIDE CRYS ER 20 MEQ PO TBCR
40.0000 meq | EXTENDED_RELEASE_TABLET | Freq: Every day | ORAL | Status: DC
  Administered 2021-05-21 – 2021-05-23 (×3): 40 meq via ORAL
  Filled 2021-05-21 (×3): qty 2

## 2021-05-21 MED ORDER — SACUBITRIL-VALSARTAN 24-26 MG PO TABS
1.0000 | ORAL_TABLET | Freq: Two times a day (BID) | ORAL | Status: DC
  Administered 2021-05-21: 1 via ORAL
  Filled 2021-05-21 (×2): qty 1

## 2021-05-21 MED ORDER — MAGNESIUM SULFATE 2 GM/50ML IV SOLN
2.0000 g | Freq: Once | INTRAVENOUS | Status: AC
  Administered 2021-05-21: 2 g via INTRAVENOUS
  Filled 2021-05-21: qty 50

## 2021-05-21 MED ORDER — INSULIN GLARGINE-YFGN 100 UNIT/ML ~~LOC~~ SOLN
15.0000 [IU] | Freq: Every day | SUBCUTANEOUS | Status: DC
  Administered 2021-05-21 (×2): 15 [IU] via SUBCUTANEOUS
  Filled 2021-05-21 (×3): qty 0.15

## 2021-05-21 MED ORDER — INFLUENZA VAC SPLIT QUAD 0.5 ML IM SUSY
0.5000 mL | PREFILLED_SYRINGE | INTRAMUSCULAR | Status: AC
  Administered 2021-05-23: 0.5 mL via INTRAMUSCULAR
  Filled 2021-05-21: qty 0.5

## 2021-05-21 NOTE — Consult Note (Signed)
   Heart Failure Nurse Navigator Note  HFrEF 35-40%  He presented to the emergency room with complaints of sharp chest discomfort that he states would come and go.  He also complained of shortness of breath, nausea and back and neck pain.  Comorbidities:  Coronary artery disease Chronic kidney disease stage II-III COPD Diabetes Hypertension Nonsustained VT Obesity Hyperlipidemia   Medications:  Entresto 24/26 mg twice daily Potassium chloride 40 mill colons daily Aspirin 81 mg daily Coreg 6.25 mg twice a day Crestor 20 mg Zetia 10 mg daily Fenofibrate 54 mg daily Xarelto 20 mg with supper  Met with patient in the ED.  He states that his breathing is somewhat improved.  There is no further chest discomfort but still has slight head and neck discomfort.  He states that he does weigh himself daily.  He feels that his dry weight is 235.  He states that he uses the metolazone 5 mg when his weight gets up to 250#.  Although Dr. Donivan Scull order says greater than 245.  He felt that he was being compliant with his sodium and fluid restriction.  Question if he would not benefit from lower dose of the metolazone as he sees that his weight is up 3 to 4 pound rather than 10 pounds.  He has an appointment in the outpatient heart failure clinic with [name] on October 11 at 9 AM.  He has a no-show rate of 14% which is 26 out of 185 appointments.  Will continue to follow along.  Pricilla Riffle RN [facility]

## 2021-05-21 NOTE — Progress Notes (Addendum)
Progress Note    Gabriel [name].  MLY:[id number] DOB: 07/07/1971  DOA: 05/20/2021 PCP: Virginia Crews, MD      Brief Narrative:    Medical records reviewed and are as summarized below:  Gabriel [name]. is a 50 y.o. 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  He presented to the hospital because of severe chest pain woke him up in the middle of the night.  It was associated with nausea and shortness of breath.  He also complained of pain in the back of the neck that appears to radiate to the back of the head.  His troponins were mildly elevated (peak troponin was 69).  BNP was 2,525.3.  He was admitted to the hospital for acute exacerbation of chronic systolic CHF.  He was treated with IV Lasix.  Cardiologist was consulted to assist with management.  He has chronically elevated troponins and ACS was ruled out.  Assessment/Plan:   Principal Problem:   Atherosclerosis of native coronary artery of native heart with unstable angina pectoris (Gabriel [name]) Active Problems:   Essential hypertension   Mixed hyperlipidemia   Acute on chronic systolic CHF (congestive heart failure) (HCC)   Type 2 diabetes mellitus with hyperlipidemia (HCC)   History of pulmonary embolism   COPD (chronic obstructive pulmonary disease) (HCC)   OSA on CPAP   Long term current use of anticoagulant   Mitral regurgitation   Mediastinal lymphadenopathy    There is no height or weight on file to calculate BMI.  Acute exacerbation of chronic  systolic CHF: Continue IV Lasix.  Monitor BMP, daily weight and urine output.  2D echo showed EF estimated at 40 to 45%, mild LVH, normal LV diastolic parameters.  Previous 2D echo in October 2021 showed EF estimated at 35 to 40%.  CAD with chest pain, chronically elevated troponins: He has been evaluated by the cardiologist.  No plans for cardiac cath.  Follow-up with cardiologist for further recommendations.  Continue aspirin  Hypokalemia and hypomagnesemia: Replete potassium and magnesium and monitor levels.  History of recurrent pulmonary embolism: Continue Xarelto  Type 2 insulin-dependent diabetes mellitus: Continue insulin glargine and NovoLog.  COPD, OSA on CPAP: Continue bronchodilators and CPAP at night.  Neck pain/occipital head pain: Probably from degenerative disc disease of the cervical spine.  Obtain x-ray of the cervical spine for further evaluation.  Of note, CT scan of the chest showed degenerative disease of the thoracic spine.  Mediastinal lymphadenopathy: Noted on previous CT scans.  Outpatient follow-up with PCP.  Diet Order             Diet 2 gram sodium Room service appropriate? Yes; Fluid consistency: Thin  Diet effective now                      Consultants: Cardiologist  Procedures: None    Medications:    [START ON 05/22/2021] aspirin  81 mg Oral Once   carvedilol  6.25 mg Oral BID WC   ezetimibe  10 mg Oral Daily   fenofibrate  54 mg Oral Daily   furosemide  40 mg  Intravenous BID   insulin aspart  0-15 Units Subcutaneous Q4H   insulin glargine-yfgn  15 Units Subcutaneous Q2200   lidocaine  1 patch Transdermal Q12H   loratadine  10 mg Oral Daily   nitroGLYCERIN  1 inch Topical Q6H   potassium chloride SA  40 mEq Oral Daily   rivaroxaban  20 mg Oral Q supper   rosuvastatin  20 mg Oral QHS   sacubitril-valsartan  0.5 tablet Oral BID   Continuous Infusions:   Anti-infectives (From admission, onward)    None               Family Communication/Anticipated D/C date and plan/Code Status   DVT prophylaxis: SCDs Start: 05/20/21 2312 Place and maintain sequential compression device Start: 05/20/21 1713 rivaroxaban (XARELTO) tablet 20 mg     Code Status: Full Code  Family Communication: None Disposition Plan:   Status is: Inpatient  Remains inpatient appropriate because:Inpatient level of care appropriate due to severity of illness  Dispo: The patient is from: Home              Anticipated d/c is to: Home              Patient currently is not medically stable to d/c.   Difficult to place patient No                Subjective:   C/o right-sided chest pain graded as 3/10.  He also complained of pain in the back of the neck and the back of the head.  Objective:    Vitals:   05/21/21 0730 05/21/21 0800 05/21/21 0830 05/21/21 0900  BP: 115/63 120/80 (!) 128/53 (!) 126/58  Pulse: 70 65 63 69  Resp: 15 17 17 20   Temp:      TempSrc:      SpO2: 92% 97% 97% 93%   No data found.  No intake or output data in the 24 hours ending 05/21/21 1039 There were no vitals filed for this visit.  Exam:  GEN: NAD SKIN: Warm and dry EYES: EOMI ENT: MMM CV: RRR PULM: CTA B ABD: soft, obese, NT, +BS CNS: AAO x 3, non focal EXT: No edema or tenderness        Data Reviewed:   I have personally reviewed following labs and imaging studies:  Labs: Labs show the following:   Basic Metabolic Panel: Recent Labs  Lab 05/20/21 1143 05/20/21 2357 05/21/21 0520  NA 141  --  140  K 4.1  --  3.1*  CL 105  --  104  CO2 20*  --  28  GLUCOSE 298*  --  164*  BUN 20  --  21*  CREATININE 1.10  --  1.08  CALCIUM 9.2  --  8.6*  MG  --  1.6*  --    GFR CrCl cannot be calculated (Unknown ideal weight.). Liver Function Tests: No results for input(s): AST, ALT, ALKPHOS, BILITOT, PROT, ALBUMIN in the last 168 hours. No results for input(s): LIPASE, AMYLASE in the last 168  hours. No results for input(s): AMMONIA in the last 168 hours. Coagulation profile No results for input(s): INR, PROTIME in the last 168 hours.  CBC: Recent Labs  Lab 05/20/21 1143 05/21/21 0520  WBC 7.9 8.3  HGB 16.2 17.0  HCT 47.2 49.1  MCV 81.5 81.0  PLT 162 179   Cardiac Enzymes: No results for input(s): CKTOTAL, CKMB, CKMBINDEX, TROPONINI in the last 168 hours. BNP (last 3 results) No  results for input(s): PROBNP in the last 8760 hours. CBG: Recent Labs  Lab 05/20/21 1831 05/20/21 2142 05/20/21 2356 05/21/21 0524 05/21/21 0942  GLUCAP 234* 199* 167* 160* 278*   D-Dimer: No results for input(s): DDIMER in the last 72 hours. Hgb A1c: No results for input(s): HGBA1C in the last 72 hours. Lipid Profile: No results for input(s): CHOL, HDL, LDLCALC, TRIG, CHOLHDL, LDLDIRECT in the last 72 hours. Thyroid function studies: No results for input(s): TSH, T4TOTAL, T3FREE, THYROIDAB in the last 72 hours.  Invalid input(s): FREET3 Anemia work up: No results for input(s): VITAMINB12, FOLATE, FERRITIN, TIBC, IRON, RETICCTPCT in the last 72 hours. Sepsis Labs: Recent Labs  Lab 05/20/21 1143 05/20/21 1404 05/21/21 0520  PROCALCITON  --  <0.10  --   WBC 7.9  --  8.3    Microbiology Recent Results (from the past 240 hour(s))  Resp Panel by RT-PCR (Flu A&B, Covid) Nasopharyngeal Swab     Status: None   Collection Time: 05/20/21  2:04 PM   Specimen: Nasopharyngeal Swab; Nasopharyngeal(NP) swabs in vial transport medium  Result Value Ref Range Status   SARS Coronavirus 2 by RT PCR NEGATIVE NEGATIVE Final    Comment: (NOTE) SARS-CoV-2 target nucleic acids are NOT DETECTED.  The SARS-CoV-2 RNA is generally detectable in upper respiratory specimens during the acute phase of infection. The lowest concentration of SARS-CoV-2 viral copies this assay can detect is 138 copies/mL. A negative result does not preclude SARS-Cov-2 infection and should not be used as the sole basis  for treatment or other patient management decisions. A negative result may occur with  improper specimen collection/handling, submission of specimen other than nasopharyngeal swab, presence of viral mutation(s) within the areas targeted by this assay, and inadequate number of viral copies(<138 copies/mL). A negative result must be combined with clinical observations, patient history, and epidemiological information. The expected result is Negative.  Fact Sheet for Patients:  EntrepreneurPulse.com.au  Fact Sheet for Healthcare Providers:  IncredibleEmployment.be  This test is no t yet approved or cleared by the [place] FDA and  has been authorized for detection and/or diagnosis of SARS-CoV-2 by FDA under an Emergency Use Authorization (EUA). This EUA will remain  in effect (meaning this test can be used) for the duration of the COVID-19 declaration under Section 564(b)(1) of the Act, 21 U.S.C.section 360bbb-3(b)(1), unless the authorization is terminated  or revoked sooner.       Influenza A by PCR NEGATIVE NEGATIVE Final   Influenza B by PCR NEGATIVE NEGATIVE Final    Comment: (NOTE) The Xpert Xpress SARS-CoV-2/FLU/RSV plus assay is intended as an aid in the diagnosis of influenza from Nasopharyngeal swab specimens and should not be used as a sole basis for treatment. Nasal washings and aspirates are unacceptable for Xpert Xpress SARS-CoV-2/FLU/RSV testing.  Fact Sheet for Patients: EntrepreneurPulse.com.au  Fact Sheet for Healthcare Providers: IncredibleEmployment.be  This test is not yet approved or cleared by the [place] FDA and has been authorized for detection and/or diagnosis of SARS-CoV-2 by FDA under an Emergency Use Authorization (EUA). This EUA will remain in effect (meaning this test can be used) for the duration of the COVID-19 declaration under Section 564(b)(1) of the Act, 21  U.S.C. section 360bbb-3(b)(1), unless the authorization is terminated or revoked.  Performed at [facility], [address] [place], [place] [address]     Procedures and diagnostic studies:  DG Chest 2 View  Result Date: 05/20/2021 CLINICAL DATA:  Shortness of breath.  Pain.  EXAM: CHEST - 2 VIEW COMPARISON:  March 28, 2021 FINDINGS: Mild cardiomegaly. The hila and mediastinum are unremarkable. Increased interstitial markings, more prominent compared to March 01, 2021 but decreased since March 28, 2021. No pneumothorax. No nodules or masses. No other acute abnormalities. IMPRESSION: Findings are most consistent with mild cardiomegaly and mild pulmonary edema. Electronically Signed   By: Dorise Bullion III M.D.   On: 05/20/2021 12:52   CT Angio Chest Aorta W and/or Wo Contrast  Result Date: 05/20/2021 CLINICAL DATA:  Chest pain.  Shortness of breath. EXAM: CT ANGIOGRAPHY CHEST WITH CONTRAST TECHNIQUE: Multidetector CT imaging of the chest was performed using the standard protocol during bolus administration of intravenous contrast. Multiplanar CT image reconstructions and MIPs were obtained to evaluate the vascular anatomy. CONTRAST:  153mL OMNIPAQUE IOHEXOL 350 MG/ML SOLN COMPARISON:  Chest radiograph earlier same day. FINDINGS: Cardiovascular: The heart is enlarged. Normal caliber thoracic aorta. Adequate opacification of pulmonary arterial system. No evidence for acute pulmonary embolus. Mediastinum/Nodes: Multiple prominent subcentimeter mediastinal lymph nodes are redemonstrated. 2 cm right infrahilar (image 46; series 4) similar to prior. Normal appearance of the esophagus. Lungs/Pleura: Central airways are patent. Patchy ground-glass opacities within the lungs bilaterally. Small bilateral pleural effusions. No pneumothorax. Upper Abdomen: No acute process. Musculoskeletal: Thoracic spine degenerative changes. No aggressive or acute appearing osseous lesions. Review of the MIP  images confirms the above findings. IMPRESSION: No acute pulmonary embolus. Scattered bilateral patchy areas of ground-glass consolidation, nonspecific. Possibility of pulmonary edema or atypical infection not excluded Small bilateral pleural effusions. Persistent mediastinal adenopathy, predominately subcentimeter in size. Additionally there is a 2 cm node within the right infrahilar region. These are likely reactive in etiology. Recommend attention on follow-up. Electronically Signed   By: Lovey Newcomer M.D.   On: 05/20/2021 16:11               LOS: 0 days   Vedansh Kerstetter  [facility]   Pager on [email or web address]. If 7PM-7AM, please contact night-coverage at [email or web address]     05/21/2021, 10:39 AM

## 2021-05-21 NOTE — ED Notes (Signed)
Informed RN bed assigned 

## 2021-05-21 NOTE — Progress Notes (Signed)
*  PRELIMINARY RESULTS* Echocardiogram 2D Echocardiogram has been performed.  Gabriel [name] 05/21/2021, 11:47 AM

## 2021-05-21 NOTE — ED Notes (Signed)
Pt placed on 2L [place] by RT, pt has sleep apnea and O2 fluctuates when he is asleep. Pt current O2 sat 96%

## 2021-05-21 NOTE — Consult Note (Signed)
Cardiology Consultation:   Patient ID: Gabriel [name].; [id number]; 03-12-1971   Admit date: 05/20/2021 Date of Consult: 05/21/2021  Primary Care Provider: Virginia Crews, MD Primary Cardiologist: Rockey Situ Primary Electrophysiologist:  None   Patient Profile:   Gabriel [name]. is a 50 y.o. male with a hx of CAD, chronic chest pain with chronic troponin elevation, HFrEF secondary to NICM, NSVT, CKD stage III, COPD, chronic anticoagulation secondary to unprovoked PE, DM2, OSA declined CPAP, and obesity who is being seen today for the evaluation of acute on chronic HFrEF at the request of Dr. Mal Misty.  History of Present Illness:   Mr. Ballweg has had multiple hospital admissions over the years for management of HFrEF, chest pain, elevated troponin levels, and COPD. Prior stress test in 2016 was nonischemic. More recently, he was admitted in 05/2020 with chest pain and elevated troponin, above his baseline and underwent R/LHC, which demonstrated CTO of the RCA with left-to-right collaterals. Echo at that time showed an EF of 35-40% with global hypokinesis. He was medically managed and has been intermittently followed by the advanced heart failure team in [place]. In 11/2020, he was admitted to [facility] secondary to near syncope. He was found to have AKI with creatinine of 2.44 (baseline 1.2). Torsemide dose was reduced to 40 mg daily and he was subsequently discharged but then returned to the ED the following day with weakness and hypotension. He was subsequently admitted for acute kidney injury, dyspnea, and chronic HFrEF. He was discharged home off of Entresto and spironolactone with reduced dose of torsemide. He was admitted to [facility] in 03/2021 with abdominal pain with imaging showing concern for a calculus cholecystitis.  General surgery initially plan on cholecystectomy however reconsidered and pursued HIDA scan. HIDA scan was negative and the cystic duct was patent, therefore making  cholecystectomy unlikely needed. With this, he left AMA. He was seen in the [facility] ED two days later with continued abdominal pain and was treated for volume overload.   He was last seen in the office on 04/04/2021 with volume overload with a weight that was up 21 pounds when compared to his visit in 11/2020 (233-->254 pounds). This was felt to be in the setting of his diuretic previously being decreased. He was given IV Lasix 80 mg in the office and advised to increase torsemide to 60 mg and take metolazone 5 mg x 2 days, followed by decreasing of torsemide to 40 mg daily thereafter with a noted sliding scale diuretic dosage on phone note dated 04/05/2021. WIth this, he noted improvement in his weight back to baseline of 236 pounds.  Since we last saw him in the office, he has needed to take prn metolazone a couple of times with noted good UOP and improvement in his weight.   He presented to [facility] ED on 05/20/2021 with sharp, right-sided that radiated to the left, chest pain that woke him from sleep on 10/2, around 2 AM. Pain was intermittent and would last about 10-15 minutes in duration followed by spontaneous resolution. Home weight 240 on 10/1. There has been some abdominal distension. No lower extremity swelling or orthopnea. He reports he has been watching his salt and fluid intake. He has been compliant with Xarelto. Vitals stable. Oxygen saturations in the mid 90s% on room air and supplemental oxygen via nasal cannula at 2 L. EKG showed NSR, 88 bpm, right axis deviation, baseline artifact, and nonspecific st/t changes. Labs showed an initial high sensitivity troponin  of 69 with a delta troponin of 60, subsequently continuing to down trend to 55. BNP 2525. Potassium 4.1-->3.1, glucose 298, BUN/SCr 20/1.1-->21/1.08. Magnesium 1.6. PCT <0.1. Covid negative. CXR demonstrated mild cardiomegaly with mild pulmonary edema. CTA chest showed no PE with patchy ground-glass consolidation possibly related to pulmonary  edema or atypical infection as well as small bilateral pleural effusions and persistent mediastinal adenopathy felt to likely be reactive in etiology. In the ED, he received IV Lasix 80 mg x 1, Haldol, IV magnesium, ASA 324 mg, SL NTG, nitropaste, Fentanyl, and KCl along with his PTA medications. Echo is pending. Currently, notes an improvement in symptoms with IV diuresis. He continues to note 3/10 right-sided chest pain that is at his baseline.    Past Medical History:  Diagnosis Date   CAD (coronary artery disease)    a. 04/2015 low risk MV;  b. 12/2016 Cath: minor irregs in LAD/Diag/LCX/OM, RCA 40p/m/d; c. 05/2020 Cath: LM nl, LAD 50d, LCX 30p, OM1 40, RCA 100p w/ L->L collats. CO/CI 3.1/1.3-->Med rx.   Chest wall pain, chronic    CHF (congestive heart failure) (HCC)    Chronic Troponin Elevation    CKD (chronic kidney disease), stage II-III    COPD (chronic obstructive pulmonary disease) (HCC)    Diabetes mellitus without complication (HCC)    HFrEF (heart failure with reduced ejection fraction) ([place])    Hypertension    Mitral regurgitation    Mild to moderate by October 2021 echocardiogram.   Mixed Ischemic & NICM (nonischemic cardiomyopathy) ([place])    a. 03/2015 Echo: EF 45-50%; b. 12/2015 Echo: EF 20-25%; c. 02/2016 Echo: EF 30-35%; d. 11/2016 Echo: EF 40-45%; e. 06/2019 Echo: EF 30-35%; f. 11/2019 Echo: EF 25-30%; g. 05/2020 Echo: EF 35-40%, glob HK, nl RV size/fxn, PASP 44.18mmHg, mod dil LA. Mild to mod MR.   Myocardial infarct (HCC)    NSVT (nonsustained ventricular tachycardia)    a. 12/2015 noted on tele-->amio;  b. 12/2015 Event monitor: no VT noted.   Obesity (BMI 30.0-34.9)    Psoriasis    Recurrent pulmonary emboli ([place]) 06/07/2020   06/07/20: small bilateral PEs.  12/31/19: RUL and RLL PEs.   Syncope    a. 01/2016 - felt to be vasovagal.    Past Surgical History:  Procedure Laterality Date   AMPUTATION     CARDIAC CATHETERIZATION     FINGER AMPUTATION     Traumatic   FINGER  FRACTURE SURGERY Left    LEFT HEART CATH AND CORONARY ANGIOGRAPHY N/A 01/06/2017   Procedure: Left Heart Cath and Coronary Angiography;  Surgeon: Wellington Hampshire, MD;  Location: [place] [facility];  Service: Cardiovascular;  Laterality: N/A;   RIGHT/LEFT HEART CATH AND CORONARY ANGIOGRAPHY N/A 06/13/2020   Procedure: RIGHT/LEFT HEART CATH AND CORONARY ANGIOGRAPHY;  Surgeon: Nelva Bush, MD;  Location: [place] [facility];  Service: Cardiovascular;  Laterality: N/A;     Home Meds: Prior to Admission medications   Medication Sig Start Date End Date Taking? Authorizing Provider  carvedilol (COREG) 6.25 MG tablet Take 1 tablet (6.25 mg total) by mouth 2 (two) times daily with a meal. 08/02/20  Yes Larey Dresser, MD  ezetimibe (ZETIA) 10 MG tablet Take 1 tablet (10 mg total) by mouth daily. 04/04/21  Yes Minna Merritts, MD  fenofibrate (TRICOR) 145 MG tablet Take 1 tablet (145 mg total) by mouth daily. 06/09/20  Yes Lorella Nimrod, MD  Insulin Glargine (BASAGLAR KWIKPEN) 100 UNIT/ML Inject 28 Units into the  skin at bedtime. 06/09/20  Yes Lorella Nimrod, MD  insulin lispro (HUMALOG) 100 UNIT/ML KwikPen Inject 3 Units into the skin 3 (three) times daily.   Yes [provider]  rivaroxaban (XARELTO) 20 MG TABS tablet Take 1 tablet (20 mg total) by mouth daily with supper. 11/07/20  Yes Cammie Sickle, MD  rosuvastatin (CRESTOR) 20 MG tablet Take 1 tablet (20 mg total) by mouth at bedtime. 06/09/20  Yes Lorella Nimrod, MD  sacubitril-valsartan (ENTRESTO) 49-51 MG Take 0.5 tablets by mouth 2 (two) times daily.   Yes [provider]  torsemide (DEMADEX) 20 MG tablet Take 2 tablets (40 mg total) by mouth daily. Take an extra 2 tablets (40MG ) as needed for shortness of breath and/or for weight 242 lb or greater. 04/09/21 07/08/21 Yes Gollan, Kathlene November, MD  cetirizine (ZYRTEC) 10 MG tablet Take 10 mg by mouth daily.    [provider]  Ipratropium-Albuterol (COMBIVENT  RESPIMAT) 20-100 MCG/ACT AERS respimat Inhale 1 puff into the lungs every 6 (six) hours as needed for wheezing.    [provider]  lidocaine (LIDODERM) 5 % Place 1 patch onto the skin every 12 (twelve) hours. Remove & Discard patch within 12 hours or as directed by MD Patient not taking: Reported on 04/04/2021 02/15/21 02/15/22  [name], MD  metolazone (ZAROXOLYN) 5 MG tablet TAKE 1 TABLET (5 MG TOTAL) BY MOUTH DAILY AS NEEDED (FOR WEIGHT OVER 245). 05/01/21   Minna Merritts, MD  nitroGLYCERIN (NITROSTAT) 0.4 MG SL tablet Place 1 tablet (0.4 mg total) under the tongue every 5 (five) minutes x 3 doses as needed for chest pain. 06/09/20   Lorella Nimrod, MD  oxyCODONE-acetaminophen (PERCOCET) 5-325 MG tablet Take 2 tablets by mouth every 6 (six) hours as needed for severe pain. Patient not taking: Reported on 05/20/2021 02/15/21   [name], MD  potassium chloride SA (KLOR-CON M20) 20 MEQ tablet TAKE 1 TABLET (20 MEQ) ONCE A DAY ONLY WHEN TAKING TORSEMIDE 40 MG TWICE DAILY 05/03/21   Minna Merritts, MD  TRULICITY 4.13 KG/4.0NU SOPN INJECT 0.75 MG INTO THE SKIN ONCE A WEEK. 09/28/20   Trinna Post, PA-C    Inpatient Medications: Scheduled Meds:  [START ON 05/22/2021] aspirin  81 mg Oral Once   carvedilol  6.25 mg Oral BID WC   ezetimibe  10 mg Oral Daily   fenofibrate  54 mg Oral Daily   furosemide  40 mg Intravenous BID   insulin aspart  0-15 Units Subcutaneous Q4H   insulin glargine-yfgn  15 Units Subcutaneous Q2200   lidocaine  1 patch Transdermal Q12H   loratadine  10 mg Oral Daily   nitroGLYCERIN  1 inch Topical Q6H   potassium chloride SA  40 mEq Oral Daily   rivaroxaban  20 mg Oral Q supper   rosuvastatin  20 mg Oral QHS   sacubitril-valsartan  0.5 tablet Oral BID   Continuous Infusions:  PRN Meds: acetaminophen **OR** acetaminophen, bisacodyl, HYDROcodone-acetaminophen, ipratropium-albuterol, metoprolol tartrate, morphine injection, nitroGLYCERIN, ondansetron  **OR** ondansetron (ZOFRAN) IV, senna-docusate  Allergies:   Allergies  Allergen Reactions   Metformin And Related Nausea And Vomiting   Prednisone Other (See Comments)    Reaction: Hallucinations     Zantac [Ranitidine Hcl] Diarrhea and Nausea Only    Night sweats    Social History:   Social History   Socioeconomic History   Marital status: Widowed    Spouse name: Not on file   Number of children: 1  Years of education: 74   Highest education level: 12th grade  Occupational History   Occupation: disabled  Tobacco Use   Smoking status: Former    Packs/day: 0.50    Years: 33.00    Pack years: 16.50    Types: Cigarettes    Quit date: 05/08/2020    Years since quitting: 1.0   Smokeless tobacco: Former    Quit date: 05/08/2020   Tobacco comments:    Quit Sept 2021  Vaping Use   Vaping Use: Former  Substance and Sexual Activity   Alcohol use: Yes    Alcohol/week: 0.0 standard drinks    Comment: occassionally   Drug use: No   Sexual activity: Not Currently  Other Topics Concern   Not on file  Social History Narrative   Lives at home with wife and his dad's wife.        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    Social Determinants of Health   Financial Resource Strain: Not on file  Food Insecurity: Not on file  Transportation Needs: Not on file  Physical Activity: Not on file  Stress: Not on file  Social Connections: Not on file  Intimate Partner Violence: Not on file     Family History:   Family History  Problem Relation Age of Onset   Diabetes Mother    Diabetes Mellitus II Mother    Hypothyroidism Mother    Hypertension Mother    Kidney failure Mother        Dialysis   Heart attack Mother        73 yo approximately   Hypertension Father    Gout Father    Cancer Maternal Grandfather    Diabetes Maternal Grandfather    Cancer Paternal Aunt     ROS:  Review of Systems   Constitutional:  Positive for malaise/fatigue. Negative for chills, diaphoresis, fever and weight loss.  HENT:  Negative for congestion.   Eyes:  Negative for discharge and redness.  Respiratory:  Positive for shortness of breath. Negative for cough, sputum production and wheezing.   Cardiovascular:  Positive for chest pain. Negative for palpitations, orthopnea, claudication, leg swelling and PND.  Gastrointestinal:  Negative for abdominal pain, blood in stool, heartburn, melena, nausea and vomiting.  Musculoskeletal:  Negative for falls and myalgias.  Skin:  Negative for rash.  Neurological:  Positive for weakness. Negative for dizziness, tingling, tremors, sensory change, speech change, focal weakness and loss of consciousness.  Endo/Heme/Allergies:  Does not bruise/bleed easily.  Psychiatric/Behavioral:  Negative for substance abuse. The patient is not nervous/anxious.   All other systems reviewed and are negative.    Physical Exam/Data:   Vitals:   05/21/21 0730 05/21/21 0800 05/21/21 0830 05/21/21 0900  BP: 115/63 120/80 (!) 128/53 (!) 126/58  Pulse: 70 65 63 69  Resp: 15 17 17 20   Temp:      TempSrc:      SpO2: 92% 97% 97% 93%   No intake or output data in the 24 hours ending 05/21/21 1045 There were no vitals filed for this visit. There is no height or weight on file to calculate BMI.   Physical Exam: General: Well developed, well nourished, in no acute distress. Head: Normocephalic, atraumatic, sclera non-icteric, no xanthomas, nares without discharge.  Neck: Negative for carotid bruits. JVD mildly elevated. Lungs: Diminished breath sounds along the bilateral bases. Breathing is unlabored. Heart: RRR with S1 S2.  II/VI systolic murmur LSB, no rubs, or gallops appreciated. Abdomen: Soft, non-tender, non-distended with normoactive bowel sounds. No hepatomegaly. No rebound/guarding. No obvious abdominal masses. Msk:  Strength and tone appear normal for age. Extremities: No  clubbing or cyanosis. No edema. Distal pedal pulses are 2+ and equal bilaterally. Neuro: Alert and oriented X 3. No facial asymmetry. No focal deficit. Moves all extremities spontaneously. Psych:  Responds to questions appropriately with a normal affect.   EKG:  The EKG was personally reviewed and demonstrates: NSR, 88 bpm, right axis deviation, baseline artifact, and nonspecific st/t changes Telemetry:  Telemetry was personally reviewed and demonstrates: SR  Weights: There were no vitals filed for this visit.  Relevant CV Studies:  2D echo 05/21/2021: Pending __________  [facility] 05/2020: Conclusions: Severe single-vessel coronary artery disease with chronic total occlusion of the proximal RCA.  The distal vessel fills via left to right collaterals. Mild to moderate, nonobstructive CAD involving the LAD and LCx with up to 50% stenosis. Mildly elevated left and right heart filling pressures. Moderate pulmonary hypertension (mean PAP 40 mmHg) with significantly elevated pulmonary vascular resistance (PVR 7.4 WU). Severely reduced Fick cardiac output/index.   Recommendations: Continue IV diuresis for at least 1 more day. Given severely reduced cardiac output, I will decrease carvedilol to 6.5 mg twice daily.  If blood pressure tolerates and renal function is stable, consider further escalation of Entresto. Restart IV heparin 2 hours after TR band removal.  If no evidence of bleeding or vascular complication, the patient can be transitioned back to [facility] tomorrow. 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  He was evaluated by Dr. Aundra Dubin in June but failed to follow-up since then. Aggressive secondary prevention of coronary artery disease.  Favor medical management of CTO of RCA, which is new since 2018. __________  2D echo 05/2020: 1. Left ventricular  ejection fraction, by estimation, is 35 to 40%. The  left ventricle has moderately decreased function. The left ventricle  demonstrates global hypokinesis. The left ventricular internal cavity size  was moderately dilated. Left  ventricular diastolic parameters are indeterminate.   2. Right ventricular systolic function is normal. The right ventricular  size is normal. There is mildly elevated pulmonary artery systolic  pressure. The estimated right ventricular systolic pressure is 32.6 mmHg.   3. Left atrial size was moderately dilated.   4. Mild to moderate mitral valve regurgitation. __________  2D echo 11/2019: 1. Left ventricular ejection fraction, by estimation, is 25 to 30%. The  left ventricle has severely decreased function. The left ventricle  demonstrates global hypokinesis. The left ventricular internal cavity size  was mildly dilated. There is mild left  ventricular hypertrophy. Left ventricular diastolic parameters are  consistent with Grade III diastolic dysfunction (restrictive). Elevated  left atrial pressure.   2. Right ventricular systolic function is moderately reduced. The right  ventricular size is mildly enlarged. There is moderately elevated  pulmonary artery systolic pressure.   3. Left atrial size was mild to moderately dilated.   4. Right atrial size was mildly dilated.   5. The mitral valve is normal in structure. Trivial mitral valve  regurgitation. No evidence of mitral stenosis.   6. The aortic valve is tricuspid. Aortic valve regurgitation is mild to  moderate. Mild aortic valve sclerosis is present, with no evidence of  aortic valve stenosis.   7. Aortic dilatation noted. There is borderline dilatation of  the aortic  root measuring 38 mm.   8. The inferior vena cava is dilated in size with <50% respiratory  variability, suggesting right atrial pressure of 15 mmHg.  __________  2D echo 06/2019: 1. Left ventricular ejection fraction, by visual  estimation, is 30 to  35%. The left ventricle has moderately decreased function. There is no  left ventricular hypertrophy.   2. Definity contrast agent was given IV to delineate the left ventricular  endocardial borders.   3. The left ventricle demonstrates global hypokinesis.   4. Global right ventricle has normal systolic function.The right  ventricular size is mildly enlarged. No increase in right ventricular wall  thickness.   5. Left atrial size was moderately dilated.   6. Moderately elevated pulmonary artery systolic pressure.   7. The tricuspid regurgitant velocity is 3.10 m/s, and with an assumed  right atrial pressure of 20 mmHg, the estimated right ventricular systolic  pressure is moderately elevated at 58.4 mmHg.   8. Moderately dilated left ventricular internal cavity size.  __________  2D echo 04/2017: - Left ventricle: The cavity size was normal. Wall thickness was    increased in a pattern of mild LVH. Systolic function was mildly    reduced. The estimated ejection fraction was in the range of 45%    to 50%. Doppler parameters are consistent with abnormal left    ventricular relaxation (grade 1 diastolic dysfunction).  - Aortic valve: Probably trileaflet; mildly thickened, mildly    calcified leaflets. There was mild regurgitation directed    eccentrically in the LVOT.  - Left atrium: The atrium was mildly dilated.  - Right ventricle: The cavity size was normal. Systolic function    was normal.  - Pericardium, extracardiac: A trivial pericardial effusion was    identified posterior to the heart.  __________   [facility] 12/2016: There is moderate to severe left ventricular systolic dysfunction. LV end diastolic pressure is moderately elevated. The left ventricular ejection fraction is 25-35% by visual estimate. Dist RCA lesion, 40 %stenosed. Prox RCA to Mid RCA lesion, 40 %stenosed.   1. Mild nonobstructive coronary artery disease. 2. Moderately to severely reduced  LV systolic function with an EF of 30-35%. 3. Moderately elevated left ventricular end-diastolic pressure. LVEDP was in the high 20s.   Recommendations: Continue medical therapy for nonobstructive coronary artery disease. The patient has nonischemic cardiomyopathy. He is volume overloaded and I increased torsemide to 40 mg by mouth twice daily. Consider adding spironolactone or eplerenone in the near future __________  2D echo 11/2016: - Left ventricle: The cavity size was mildly dilated. There was    mild concentric hypertrophy. Systolic function was mildly to    moderately reduced. The estimated ejection fraction was in the    range of 40% to 45%. Diffuse hypokinesis. Left ventricular    diastolic function parameters were normal.  - Aortic root: The aortic root was mildly dilated.  - Mitral valve: There was mild regurgitation.  - Left atrium: The atrium was moderately dilated.  - Pulmonary arteries: Systolic pressure could not be accurately    estimated.  - Inferior vena cava: The vessel was dilated. The respirophasic    diameter changes were blunted (< 50%), consistent with elevated    central venous pressure.   Impressions:   - Challenging image quality. __________  2D echo 02/2016: - Left ventricle: The cavity size was mildly dilated. There was    mild concentric hypertrophy. Systolic function was moderately to    severely  reduced. The estimated ejection fraction was in the    range of 30% to 35%. Diffuse hypokinesis. Left ventricular    diastolic function parameters were normal.  - Aortic valve: There was mild regurgitation.  - Mitral valve: There was mild regurgitation.  - Left atrium: The atrium was moderately dilated.  - Pulmonary arteries: Systolic pressure was moderately increased.    PA peak pressure: 47 mm Hg (S).  __________  2D echo 12/2015: - Left ventricle: The cavity size was mildly dilated. There was    mild concentric hypertrophy. Systolic function was  severely    reduced. The estimated ejection fraction was in the range of 20%    to 25%. Diffuse hypokinesis. Regional wall motion abnormalities    cannot be excluded. Left ventricular diastolic function    parameters were normal.  - Mitral valve: There was mild regurgitation.  - Left atrium: The atrium was moderately dilated.  - Right ventricle: Systolic function was normal.  - Tricuspid valve: There was mild-moderate regurgitation.  - Pulmonary arteries: Systolic pressure was within the normal    range.  __________  [facility] 04/2015: T wave inversion was noted during stress in the V5 and V6 leads. There was no ST segment deviation noted during stress. Defect 1: There is a small defect of mild severity present in the apex location. this is likely due to apical thinning. This is an intermediate risk study mainly due to cardiomyopathy The left ventricular ejection fraction is moderately decreased (30-44%). No evidence of significant perfusion abnormalities. Likely nonischemic cardiomyopathy. __________  2D echo 03/2015: - Left ventricle: The cavity size was mildly dilated. Systolic    function was mildly reduced. The estimated ejection fraction was    in the range of 45% to 50%. Doppler parameters are consistent    with abnormal left ventricular relaxation (grade 1 diastolic    dysfunction).  - Aortic valve: Valve area (Vmax): 2.51 cm^2.  - Mitral valve: There was mild regurgitation.  - Left atrium: The atrium was mildly dilated.  - Right ventricle: The cavity size was mildly dilated.  - Right atrium: The atrium was mildly dilated.  - Atrial septum: No defect or patent foramen ovale was identified.   Impressions:   - There is a dilated left atrium and right atrium and mildly    dilated left ventricle , and mild LV dysfunction with diffuse    hypokinesis and mild diastolic dysfunction.    Laboratory Data:  Chemistry Recent Labs  Lab 05/20/21 1143 05/21/21 0520  NA 141  140  K 4.1 3.1*  CL 105 104  CO2 20* 28  GLUCOSE 298* 164*  BUN 20 21*  CREATININE 1.10 1.08  CALCIUM 9.2 8.6*  GFRNONAA >60 >60  ANIONGAP 16* 8    No results for input(s): PROT, ALBUMIN, AST, ALT, ALKPHOS, BILITOT in the last 168 hours. Hematology Recent Labs  Lab 05/20/21 1143 05/21/21 0520  WBC 7.9 8.3  RBC 5.79 6.06*  HGB 16.2 17.0  HCT 47.2 49.1  MCV 81.5 81.0  MCH 28.0 28.1  MCHC 34.3 34.6  RDW 14.8 14.9  PLT 162 179   Cardiac EnzymesNo results for input(s): TROPONINI in the last 168 hours. No results for input(s): TROPIPOC in the last 168 hours.  BNP Recent Labs  Lab 05/20/21 1404  BNP 2,525.3*    DDimer No results for input(s): DDIMER in the last 168 hours.  Radiology/Studies:  DG Chest 2 View  Result Date: 05/20/2021 IMPRESSION: Findings are most consistent  with mild cardiomegaly and mild pulmonary edema. Electronically Signed   By: Dorise Bullion III M.D.   On: 05/20/2021 12:52   CT Angio Chest Aorta W and/or Wo Contrast  Result Date: 05/20/2021 IMPRESSION: No acute pulmonary embolus. Scattered bilateral patchy areas of ground-glass consolidation, nonspecific. Possibility of pulmonary edema or atypical infection not excluded Small bilateral pleural effusions. Persistent mediastinal adenopathy, predominately subcentimeter in size. Additionally there is a 2 cm node within the right infrahilar region. These are likely reactive in etiology. Recommend attention on follow-up. Electronically Signed   By: Lovey Newcomer M.D.   On: 05/20/2021 16:11    Assessment and Plan:   1. Acute on chronic HFrEF secondary to NICM: -He continues to appear volume up -Agree with IV Lasix 40 mg bid -Cardiomyopathy is out of proportion to his CAD -No weight in the ED for review -Daily standing scale weights -Strict I/O -Echo pending -If he is found to have a worsening cardiomyopathy, or his symptoms persist despite adequate diuresis and medical management, he may require a  diagnostic cath to help guide medical therapy and to assess for progression of CAD -PTA Coreg, Entresto -Following diuresis, look to add SGLT2i prior to discharge   2. CAD with chronic chest pain and chronically elevated troponin levels: -Currently, at his baseline with 3/10 right-sided chest pain -High sensitivity troponin is minimally elevated, and down trending, less than his prior levels and not consistent with ACS -No indication for heparin gtt at this time -Echo pending -ASA  3. History of recurrent unprovoked PE: -PTA Xarelto  4. Mediastinal adenopathy: -Will need outpatient follow up with his PCP  5. HTN: -Blood pressure well controlled  -Medications as above  6. HLD: -LDL 60 in 11/2020 -PTA Crestor and Zetia  7. Obesity with OSA: -Weight loss is encouraged  -Has previously declined CPAP in the outpatient setting    For questions or updates, please contact [place] Please consult [email or web address] for contact info under Cardiology/STEMI.   Signed, Christell Faith, PA-C [facility] Pager: [phone] 05/21/2021, 10:45 AM

## 2021-05-22 DIAGNOSIS — N179 Acute kidney failure, unspecified: Secondary | ICD-10-CM | POA: Diagnosis not present

## 2021-05-22 DIAGNOSIS — Z7901 Long term (current) use of anticoagulants: Secondary | ICD-10-CM

## 2021-05-22 DIAGNOSIS — I5023 Acute on chronic systolic (congestive) heart failure: Secondary | ICD-10-CM | POA: Diagnosis not present

## 2021-05-22 DIAGNOSIS — I25110 Atherosclerotic heart disease of native coronary artery with unstable angina pectoris: Secondary | ICD-10-CM | POA: Diagnosis not present

## 2021-05-22 LAB — GLUCOSE, CAPILLARY
Glucose-Capillary: 238 mg/dL — ABNORMAL HIGH (ref 70–99)
Glucose-Capillary: 267 mg/dL — ABNORMAL HIGH (ref 70–99)
Glucose-Capillary: 269 mg/dL — ABNORMAL HIGH (ref 70–99)
Glucose-Capillary: 271 mg/dL — ABNORMAL HIGH (ref 70–99)
Glucose-Capillary: 327 mg/dL — ABNORMAL HIGH (ref 70–99)

## 2021-05-22 LAB — BASIC METABOLIC PANEL
Anion gap: 8 (ref 5–15)
BUN: 25 mg/dL — ABNORMAL HIGH (ref 6–20)
CO2: 29 mmol/L (ref 22–32)
Calcium: 9.3 mg/dL (ref 8.9–10.3)
Chloride: 99 mmol/L (ref 98–111)
Creatinine, Ser: 1.37 mg/dL — ABNORMAL HIGH (ref 0.61–1.24)
GFR, Estimated: >60 mL/min (ref >60)
Glucose, Bld: 222 mg/dL — ABNORMAL HIGH (ref 70–99)
Potassium: 3.7 mmol/L (ref 3.5–5.1)
Sodium: 136 mmol/L (ref 135–145)

## 2021-05-22 LAB — MAGNESIUM: Magnesium: 1.8 mg/dL (ref 1.7–2.4)

## 2021-05-22 MED ORDER — INSULIN ASPART 100 UNIT/ML IJ SOLN
4.0000 [IU] | Freq: Three times a day (TID) | INTRAMUSCULAR | Status: DC
  Administered 2021-05-22 – 2021-05-23 (×2): 4 [IU] via SUBCUTANEOUS
  Filled 2021-05-22 (×2): qty 1

## 2021-05-22 MED ORDER — INSULIN GLARGINE-YFGN 100 UNIT/ML ~~LOC~~ SOLN
28.0000 [IU] | Freq: Every day | SUBCUTANEOUS | Status: DC
  Administered 2021-05-22: 28 [IU] via SUBCUTANEOUS
  Filled 2021-05-22 (×2): qty 0.28

## 2021-05-22 NOTE — Progress Notes (Signed)
Inpatient Diabetes Program Recommendations  AACE/ADA: New Consensus Statement on Inpatient Glycemic Control   Target Ranges:  Prepandial:   less than 140 mg/dL      Peak postprandial:   less than 180 mg/dL (1-2 hours)      Critically ill patients:  140 - 180 mg/dL   Results for Gabriel [name], Gabriel [name] (MRN [id number]) as of 05/22/2021 11:43  Ref. Range 05/21/2021 05:24 05/21/2021 09:42 05/21/2021 13:36 05/21/2021 17:23 05/21/2021 20:29 05/22/2021 00:12 05/22/2021 07:27  Glucose-Capillary Latest Ref Range: 70 - 99 mg/dL 160 (H) 278 (H) 227 (H) 171 (H) 270 (H) 267 (H) 238 (H)   Review of Glycemic Control  Diabetes history: DM2 Outpatient Diabetes medications: Basaglar 28 units QHS, Humalog 3 units TID with meals, Trulicity 9.53 mg Qweek Current orders for Inpatient glycemic control: Semglee 15 units QHS, Novolog 0-15 units Q4H  Inpatient Diabetes Program Recommendations:    Insulin: Please consider increasing Semglee to 28 units QHS and ordering Novolog 3 units TID with meals for meal coverage if patient eats at least 50% of meals.   Thanks, Barnie Alderman, RN, MSN, CDE Diabetes Coordinator Inpatient Diabetes Program [phone] (Team Pager from 8am to 5pm)

## 2021-05-22 NOTE — Progress Notes (Addendum)
Progress Note    Gabriel [name].  VWU:[id number] DOB: 1971-04-10  DOA: 05/20/2021 PCP: Gabriel Crews, MD      Brief Narrative:    Medical records reviewed and are as summarized below:  Gabriel [name]. is a 50 y.o. 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  He presented to the hospital because of severe chest pain woke him up in the middle of the night.  It was associated with nausea and shortness of breath.  He also complained of pain in the back of the neck that appears to radiate to the back of the head.  His troponins were mildly elevated (peak troponin was 69).  BNP was 2,525.3.  He was admitted to the hospital for acute exacerbation of chronic systolic CHF.  He was treated with IV Lasix.  Cardiologist was consulted to assist with management.  He has chronically elevated troponins and ACS was ruled out.    Assessment/Plan:   Principal Problem:   Atherosclerosis of native coronary artery of native heart with unstable angina pectoris ([place]) Active Problems:   Essential hypertension   Mixed hyperlipidemia   Acute on chronic systolic CHF (congestive heart failure) (HCC)   Type 2 diabetes mellitus with hyperlipidemia (HCC)   History of pulmonary embolism   COPD (chronic obstructive pulmonary disease) (HCC)   OSA on CPAP   Long term current use of anticoagulant   Mitral regurgitation   Mediastinal lymphadenopathy   Acute on chronic diastolic CHF (congestive heart failure) (HCC)    Body mass index is  32.4 kg/m.  Acute exacerbation of chronic systolic CHF: Hold IV Lasix because of increasing creatinine.  Monitor BMP, daily weight and urine output.  2D echo showed EF estimated at 40 to 45%, mild LVH, normal LV diastolic parameters.  Previous 2D echo in October 2021 showed EF estimated at 35 to 40%.  CAD with chest pain, chronically elevated troponins: He has been evaluated by the cardiologist and there are no plans for cardiac catheterization.  Follow-up with cardiologist for further recommendations.  Continue aspirin  Increasing creatinine level: Hold IV Lasix and Entresto.  Hypokalemia and hypomagnesemia: Improved  History of recurrent pulmonary embolism: Continue Xarelto  Type 2 insulin-dependent diabetes mellitus with hyperglycemia: Increase insulin glargine from 15 units to 28 units nightly (home dose).  Add NovoLog 4 units 3 times daily with meals.  Use NovoLog as needed for hyperglycemia.  COPD, OSA on CPAP: Continue bronchodilators and CPAP at night.  Neck pain/occipital head pain: X-ray of the cervical spine showed degenerative changes noted from C4-C7. Of note, CT scan of the chest showed degenerative disease of the thoracic spine.  Analgesics as needed for pain.  Mediastinal lymphadenopathy: Noted on previous CT scans.  Outpatient follow-up with PCP.  Possible discharge home tomorrow.  Diet Order             Diet heart healthy/carb modified Room service appropriate? Yes; Fluid consistency: Thin  Diet effective now                      Consultants: Cardiologist  Procedures: None  Medications:    carvedilol  6.25 mg Oral BID WC   ezetimibe  10 mg Oral Daily   fenofibrate  54 mg Oral Daily   influenza vac split quadrivalent PF  0.5 mL Intramuscular Tomorrow-1000   insulin aspart  0-15 Units Subcutaneous Q4H   insulin aspart  4 Units Subcutaneous TID WC   insulin glargine-yfgn  28 Units Subcutaneous Q2200   lidocaine  1 patch Transdermal Q12H    loratadine  10 mg Oral Daily   nitroGLYCERIN  1 inch Topical Q6H   potassium chloride SA  40 mEq Oral Daily   rivaroxaban  20 mg Oral Q supper   rosuvastatin  20 mg Oral QHS   Continuous Infusions:   Anti-infectives (From admission, onward)    None              Family Communication/Anticipated D/C date and plan/Code Status   DVT prophylaxis: SCDs Start: 05/20/21 2312 Place and maintain sequential compression device Start: 05/20/21 1713 rivaroxaban (XARELTO) tablet 20 mg     Code Status: Full Code  Family Communication: None Disposition Plan:   Status is: Inpatient  Remains inpatient appropriate because:Inpatient level of care appropriate due to severity of illness  Dispo: The patient is from: Home              Anticipated d/c is to: Home              Patient currently is not medically stable to d/c.   Difficult to place patient No                Subjective:   Interval events noted.  He still has pain in the back of his neck.  Right-sided chest pain is better.  Objective:    Vitals:   05/22/21 0235 05/22/21 0727 05/22/21 1057 05/22/21 1147  BP: 135/81 (!) 149/97  133/83  Pulse: 75 82  78  Resp: 18 19  19   Temp: 98.1 F (36.7 C) 98.1 F (36.7 C)  98.2 F (36.8 C)  TempSrc:  Oral  Oral  SpO2: 96% 98%  93%  Weight:   108.4 kg   Height:       No data found.   Intake/Output Summary (Last 24 hours) at 05/22/2021 1409 Last data filed at 05/22/2021 1345 Gross per 24 hour  Intake 2040 ml  Output 4400 ml  Net -2360 ml   Filed Weights   05/21/21 1728 05/22/21 1057  Weight: 107.2 kg 108.4 kg    Exam:   GEN: NAD SKIN: No rash EYES: EOMI ENT: MMM CV: RRR PULM: CTA B ABD: soft, obese, NT, +BS CNS: AAO x 3, non focal EXT: No edema or tenderness         Data Reviewed:   I have personally reviewed following labs and imaging studies:  Labs: Labs show the following:   Basic Metabolic Panel: Recent Labs  Lab  05/20/21 1143 05/20/21 2357 05/21/21 0520 05/22/21 0414  NA 141  --  140 136  K 4.1  --  3.1* 3.7  CL 105  --  104 99  CO2 20*  --  28 29  GLUCOSE 298*  --  164* 222*  BUN 20  --  21* 25*  CREATININE 1.10  --  1.08 1.37*  CALCIUM 9.2  --  8.6* 9.3  MG  --  1.6*  --  1.8   GFR Estimated Creatinine Clearance: 80.2 mL/min (A) (by C-G formula based on SCr of 1.37 mg/dL (  H)). Liver Function Tests: No results for input(s): AST, ALT, ALKPHOS, BILITOT, PROT, ALBUMIN in the last 168 hours. No results for input(s): LIPASE, AMYLASE in the last 168 hours. No results for input(s): AMMONIA in the last 168 hours. Coagulation profile No results for input(s): INR, PROTIME in the last 168 hours.  CBC: Recent Labs  Lab 05/20/21 1143 05/21/21 0520  WBC 7.9 8.3  HGB 16.2 17.0  HCT 47.2 49.1  MCV 81.5 81.0  PLT 162 179   Cardiac Enzymes: No results for input(s): CKTOTAL, CKMB, CKMBINDEX, TROPONINI in the last 168 hours. BNP (last 3 results) No results for input(s): PROBNP in the last 8760 hours. CBG: Recent Labs  Lab 05/21/21 1723 05/21/21 2029 05/22/21 0012 05/22/21 0727 05/22/21 1146  GLUCAP 171* 270* 267* 238* 327*   D-Dimer: No results for input(s): DDIMER in the last 72 hours. Hgb A1c: No results for input(s): HGBA1C in the last 72 hours. Lipid Profile: No results for input(s): CHOL, HDL, LDLCALC, TRIG, CHOLHDL, LDLDIRECT in the last 72 hours. Thyroid function studies: No results for input(s): TSH, T4TOTAL, T3FREE, THYROIDAB in the last 72 hours.  Invalid input(s): FREET3 Anemia work up: No results for input(s): VITAMINB12, FOLATE, FERRITIN, TIBC, IRON, RETICCTPCT in the last 72 hours. Sepsis Labs: Recent Labs  Lab 05/20/21 1143 05/20/21 1404 05/21/21 0520  PROCALCITON  --  <0.10  --   WBC 7.9  --  8.3    Microbiology Recent Results (from the past 240 hour(s))  Resp Panel by RT-PCR (Flu A&B, Covid) Nasopharyngeal Swab     Status: None   Collection Time:  05/20/21  2:04 PM   Specimen: Nasopharyngeal Swab; Nasopharyngeal(NP) swabs in vial transport medium  Result Value Ref Range Status   SARS Coronavirus 2 by RT PCR NEGATIVE NEGATIVE Final    Comment: (NOTE) SARS-CoV-2 target nucleic acids are NOT DETECTED.  The SARS-CoV-2 RNA is generally detectable in upper respiratory specimens during the acute phase of infection. The lowest concentration of SARS-CoV-2 viral copies this assay can detect is 138 copies/mL. A negative result does not preclude SARS-Cov-2 infection and should not be used as the sole basis for treatment or other patient management decisions. A negative result may occur with  improper specimen collection/handling, submission of specimen other than nasopharyngeal swab, presence of viral mutation(s) within the areas targeted by this assay, and inadequate number of viral copies(<138 copies/mL). A negative result must be combined with clinical observations, patient history, and epidemiological information. The expected result is Negative.  Fact Sheet for Patients:  EntrepreneurPulse.com.au  Fact Sheet for Healthcare Providers:  IncredibleEmployment.be  This test is no t yet approved or cleared by the [place] FDA and  has been authorized for detection and/or diagnosis of SARS-CoV-2 by FDA under an Emergency Use Authorization (EUA). This EUA will remain  in effect (meaning this test can be used) for the duration of the COVID-19 declaration under Section 564(b)(1) of the Act, 21 U.S.C.section 360bbb-3(b)(1), unless the authorization is terminated  or revoked sooner.       Influenza A by PCR NEGATIVE NEGATIVE Final   Influenza B by PCR NEGATIVE NEGATIVE Final    Comment: (NOTE) The Xpert Xpress SARS-CoV-2/FLU/RSV plus assay is intended as an aid in the diagnosis of influenza from Nasopharyngeal swab specimens and should not be used as a sole basis for treatment. Nasal washings  and aspirates are unacceptable for Xpert Xpress SARS-CoV-2/FLU/RSV testing.  Fact Sheet for Patients: EntrepreneurPulse.com.au  Fact Sheet for Healthcare Providers: IncredibleEmployment.be  This test is not yet approved or cleared by the [place] and has been authorized for detection and/or diagnosis of SARS-CoV-2 by FDA under an Emergency Use Authorization (EUA). This EUA will remain in effect (meaning this test can be used) for the duration of the COVID-19 declaration under Section 564(b)(1) of the Act, 21 U.S.C. section 360bbb-3(b)(1), unless the authorization is terminated or revoked.  Performed at [facility], [address] [place], [place] [address]     Procedures and diagnostic studies:  DG Cervical Spine 2 or 3 views  Result Date: 05/21/2021 CLINICAL DATA:  Neck pain for 2 days following turning head, initial encounter EXAM: CERVICAL SPINE - 3 VIEW COMPARISON:  None. FINDINGS: Seven cervical segments are well visualized. Vertebral body height is well maintained. Osteophytic changes are noted from C4-C7. No anterolisthesis is seen. The odontoid is within normal limits. No soft tissue abnormality is seen. IMPRESSION: Mild degenerative change without acute abnormality. Electronically Signed   By: Inez Catalina M.D.   On: 05/21/2021 19:49   CT Angio Chest Aorta W and/or Wo Contrast  Result Date: 05/20/2021 CLINICAL DATA:  Chest pain.  Shortness of breath. EXAM: CT ANGIOGRAPHY CHEST WITH CONTRAST TECHNIQUE: Multidetector CT imaging of the chest was performed using the standard protocol during bolus administration of intravenous contrast. Multiplanar CT image reconstructions and MIPs were obtained to evaluate the vascular anatomy. CONTRAST:  142mL OMNIPAQUE IOHEXOL 350 MG/ML SOLN COMPARISON:  Chest radiograph earlier same day. FINDINGS: Cardiovascular: The heart is enlarged. Normal caliber thoracic aorta. Adequate opacification  of pulmonary arterial system. No evidence for acute pulmonary embolus. Mediastinum/Nodes: Multiple prominent subcentimeter mediastinal lymph nodes are redemonstrated. 2 cm right infrahilar (image 46; series 4) similar to prior. Normal appearance of the esophagus. Lungs/Pleura: Central airways are patent. Patchy ground-glass opacities within the lungs bilaterally. Small bilateral pleural effusions. No pneumothorax. Upper Abdomen: No acute process. Musculoskeletal: Thoracic spine degenerative changes. No aggressive or acute appearing osseous lesions. Review of the MIP images confirms the above findings. IMPRESSION: No acute pulmonary embolus. Scattered bilateral patchy areas of ground-glass consolidation, nonspecific. Possibility of pulmonary edema or atypical infection not excluded Small bilateral pleural effusions. Persistent mediastinal adenopathy, predominately subcentimeter in size. Additionally there is a 2 cm node within the right infrahilar region. These are likely reactive in etiology. Recommend attention on follow-up. Electronically Signed   By: Lovey Newcomer M.D.   On: 05/20/2021 16:11   ECHOCARDIOGRAM COMPLETE  Result Date: 05/21/2021    ECHOCARDIOGRAM REPORT   Patient Name:   Gabriel [name]. Date of Exam: 05/21/2021 Medical Rec #:  [id number]           Height:       72.0 in Accession #:    [id number]          Weight:       254.4 lb Date of Birth:  [birth year]           BSA:          2.360 m Patient Age:    49 years            BP:           126/58 mmHg Patient Gender: M                   HR:           71 bpm. Exam Location:  [facility] Procedure: 2D Echo, Color Doppler, Cardiac Doppler and Intracardiac  Opacification Agent Indications:     I50.31 congestive heart failure-Acute Diastolic  History:         Patient has prior history of Echocardiogram examinations, most                  recent 06/08/2020. HFrEF, CAD, CKD, Signs/Symptoms:Shortness of                  Breath and Chest Pain; Risk  Factors:Hypertension.  Sonographer:     Charmayne Sheer Referring Phys:  [id number] Tacey Ruiz Diagnosing Phys: Kathlyn Sacramento MD  Sonographer Comments: Suboptimal apical window and no subcostal window. Image acquisition challenging due to patient body habitus. IMPRESSIONS  1. Left ventricular ejection fraction, by estimation, is 40 to 45%. The left ventricle has mildly decreased function. Left ventricular endocardial border not optimally defined to evaluate regional wall motion. The left ventricular internal cavity size was mildly dilated. There is mild left ventricular hypertrophy. Left ventricular diastolic parameters were normal.  2. Right ventricular systolic function is normal. The right ventricular size is normal. Tricuspid regurgitation signal is inadequate for assessing PA pressure.  3. Left atrial size was mild to moderately dilated.  4. Right atrial size was mildly dilated.  5. The mitral valve is normal in structure. Mild mitral valve regurgitation. No evidence of mitral stenosis.  6. The aortic valve is normal in structure. Aortic valve regurgitation is mild. Mild to moderate aortic valve sclerosis/calcification is present, without any evidence of aortic stenosis. FINDINGS  Left Ventricle: Left ventricular ejection fraction, by estimation, is 40 to 45%. The left ventricle has mildly decreased function. Left ventricular endocardial border not optimally defined to evaluate regional wall motion. Definity contrast agent was given IV to delineate the left ventricular endocardial borders. The left ventricular internal cavity size was mildly dilated. There is mild left ventricular hypertrophy. Left ventricular diastolic parameters were normal. Right Ventricle: The right ventricular size is normal. No increase in right ventricular wall thickness. Right ventricular systolic function is normal. Tricuspid regurgitation signal is inadequate for assessing PA pressure. Left Atrium: Left atrial size was mild to moderately  dilated. Right Atrium: Right atrial size was mildly dilated. Pericardium: There is no evidence of pericardial effusion. Mitral Valve: The mitral valve is normal in structure. Mild mitral valve regurgitation. No evidence of mitral valve stenosis. MV peak gradient, 2.1 mmHg. The mean mitral valve gradient is 1.0 mmHg. Tricuspid Valve: The tricuspid valve is normal in structure. Tricuspid valve regurgitation is not demonstrated. No evidence of tricuspid stenosis. Aortic Valve: The aortic valve is normal in structure. Aortic valve regurgitation is mild. Mild to moderate aortic valve sclerosis/calcification is present, without any evidence of aortic stenosis. Aortic valve mean gradient measures 3.0 mmHg. Aortic valve peak gradient measures 4.8 mmHg. Aortic valve area, by VTI measures 3.64 cm. Pulmonic Valve: The pulmonic valve was normal in structure. Pulmonic valve regurgitation is not visualized. No evidence of pulmonic stenosis. Aorta: The aortic root is normal in size and structure. Venous: The inferior vena cava was not well visualized. IAS/Shunts: No atrial level shunt detected by color flow Doppler.  LEFT VENTRICLE PLAX 2D LVIDd:         6.20 cm  Diastology LVIDs:         5.00 cm  LV e' medial:    4.03 cm/s LV PW:         1.50 cm  LV E/e' medial:  14.7 LV IVS:        1.00 cm  LV e'  lateral:   9.14 cm/s LVOT diam:     2.50 cm  LV E/e' lateral: 6.5 LV SV:         69 LV SV Index:   29 LVOT Area:     4.91 cm  RIGHT VENTRICLE RV Basal diam:  4.60 cm LEFT ATRIUM             Index       RIGHT ATRIUM           Index LA diam:        5.30 cm 2.25 cm/m  RA Area:     20.30 cm LA Vol (A2C):   48.1 ml 20.38 ml/m RA Volume:   62.50 ml  26.48 ml/m LA Vol (A4C):   48.2 ml 20.43 ml/m LA Biplane Vol: 48.6 ml 20.59 ml/m  AORTIC VALVE                   PULMONIC VALVE AV Area (Vmax):    3.64 cm    PV Vmax:       0.80 m/s AV Area (Vmean):   3.40 cm    PV Vmean:      56.700 cm/s AV Area (VTI):     3.64 cm    PV VTI:         0.115 m AV Vmax:           109.00 cm/s PV Peak grad:  2.6 mmHg AV Vmean:          82.700 cm/s PV Mean grad:  1.0 mmHg AV VTI:            0.189 m AV Peak Grad:      4.8 mmHg AV Mean Grad:      3.0 mmHg LVOT Vmax:         80.90 cm/s LVOT Vmean:        57.300 cm/s LVOT VTI:          0.140 m LVOT/AV VTI ratio: 0.74  AORTA Ao Root diam: 3.80 cm MITRAL VALVE MV Area (PHT): 3.68 cm    SHUNTS MV Area VTI:   3.47 cm    Systemic VTI:  0.14 m MV Peak grad:  2.1 mmHg    Systemic Diam: 2.50 cm MV Mean grad:  1.0 mmHg MV Vmax:       0.72 m/s MV Vmean:      45.0 cm/s MV Decel Time: 206 msec MV E velocity: 59.10 cm/s MV A velocity: 48.40 cm/s MV E/A ratio:  1.22 Kathlyn Sacramento MD Electronically signed by Kathlyn Sacramento MD Signature Date/Time: 05/21/2021/3:03:54 PM    Final                LOS: 1 day   Rye Decoste  [facility]   Pager on [email or web address]. If 7PM-7AM, please contact night-coverage at [email or web address]     05/22/2021, 2:09 PM

## 2021-05-22 NOTE — TOC Initial Note (Signed)
Transition of Care (TOC) - Initial/Assessment Note    Patient Details  Name: Gabriel [name]. MRN: [id number] Date of Birth: 1971/06/06  Transition of Care [facility]) CM/SW Contact:    Beverly Sessions, RN Phone Number: 05/22/2021, 4:20 PM  Clinical Narrative:                  Patient admitted from home with Angina Patient lives at home with father  [facility]P [facility]  Patient denies issues obtaining medications or with transportation  Patient has been seen by the heart failure navigator.  Confirms he has scales for daily weights.  Independent at baseline.  Do not anticipated TOC needs at discharge  Expected Discharge Plan: Home/Self Care Barriers to Discharge: Continued Medical Work up   Patient Goals and CMS Choice        Expected Discharge Plan and Services Expected Discharge Plan: Home/Self Care   Discharge Planning Services: CM Consult   Living arrangements for the past 2 months: Single Family Home                                      Prior Living Arrangements/Services Living arrangements for the past 2 months: Single Family Home Lives with:: Parents Patient language and need for interpreter reviewed:: Yes Do you feel safe going back to the place where you live?: Yes      Need for Family Participation in Patient Care: No (Comment) Care giver support system in place?: Yes (comment)   Criminal Activity/Legal Involvement Pertinent to Current Situation/Hospitalization: No - Comment as needed  Activities of Daily Living Home Assistive Devices/Equipment: None ADL Screening (condition at time of admission) Patient's cognitive ability adequate to safely complete daily activities?: Yes Is the patient deaf or have difficulty hearing?: No Does the patient have difficulty seeing, even when wearing glasses/contacts?: No Does the patient have difficulty concentrating, remembering, or making decisions?: No Patient able to express need for assistance  with ADLs?: Yes Does the patient have difficulty dressing or bathing?: No Independently performs ADLs?: Yes (appropriate for developmental age) Does the patient have difficulty walking or climbing stairs?: No Weakness of Legs: None Weakness of Arms/Hands: None  Permission Sought/Granted                  Emotional Assessment Appearance:: Appears stated age     Orientation: : Oriented to Self, Oriented to Place, Oriented to  Time, Oriented to Situation      Admission diagnosis:  Hyperglycemia [R73.9] Anticoagulated [Z79.01] Troponin I above reference range [R77.8] Acute on chronic diastolic CHF (congestive heart failure) ([place]) [I50.33] Type 2 diabetes mellitus with hyperlipidemia ([place]) [E11.69, E78.5] Acute on chronic HFrEF (heart failure with reduced ejection fraction) (HCC) [I50.23] Congestive heart failure, unspecified HF chronicity, unspecified heart failure type ([place]) [I50.9] Patient Active Problem List   Diagnosis Date Noted   Type II or unspecified type diabetes mellitus without mention of complication, not stated as uncontrolled 05/21/2021   Acute on chronic diastolic CHF (congestive heart failure) ([place]) 05/21/2021   Atherosclerosis of native coronary artery of native heart with unstable angina pectoris ([place]) 05/20/2021   Long term current use of anticoagulant 05/20/2021   Mitral regurgitation 05/20/2021   Mediastinal lymphadenopathy 05/20/2021   Acalculous cholecystitis 03/29/2021   Right upper quadrant abdominal pain 03/28/2021   AKI (acute kidney injury) ([place]) 11/24/2020   Acute kidney injury ([place]) 11/23/2020   OSA on  CPAP 11/23/2020   Chest pain 10/27/2020   Snoring 08/15/2020   Type II diabetes mellitus with renal manifestations ([place]) 06/11/2020   COPD (chronic obstructive pulmonary disease) ([place]) 06/11/2020   Leukocytosis 06/11/2020   PE (pulmonary thromboembolism) ([place]) 06/11/2020   Hyperglycemia    Elevated brain natriuretic peptide (BNP) level     Uncontrolled type 2 diabetes mellitus with hyperglycemia, with long-term current use of insulin ([place]) 05/13/2020   Acute on chronic HFrEF (heart failure with reduced ejection fraction) ([place]) 05/12/2020   Erectile dysfunction due to arterial insufficiency 03/15/2020   Screening PSA (prostate specific antigen) 03/15/2020   Erectile dysfunction 03/02/2020   Acute pulmonary embolism ([place]) 01/03/2020   History of pulmonary embolism    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   Hemoptysis    Costochondritis    Acute respiratory failure with hypoxia ([place])    Acute on chronic combined systolic (congestive) and diastolic (congestive) heart failure ([place]) 12/20/2019   Acute on chronic heart failure ([place]) 11/22/2019   CKD (chronic kidney disease), stage IIIa 06/26/2019   Type 2 diabetes mellitus with hyperlipidemia ([place]) 06/26/2019   Hypertensive urgency 06/26/2019   Acute on chronic combined systolic and diastolic CHF (congestive heart failure) ([place]) 09/02/2017   Other chest pain 08/24/2017   Dizziness    Noncompliance with medications 04/29/2017   Back pain 03/06/2017   Tobacco use 12/04/2016   Gallbladder sludge 12/04/2016   Acute on chronic systolic CHF (congestive heart failure) ([place]) 11/28/2016   Coronary artery disease, non-occlusive 03/15/2016   Atypical chest pain 02/16/2016   Orthostatic hypotension 01/23/2016   Hypomagnesemia 02/09/7627   Chronic systolic CHF (congestive heart failure) ([place]) 01/23/2016   NICM (nonischemic cardiomyopathy) ([place]) 01/17/2016   NSVT (nonsustained ventricular tachycardia)    Troponin I above reference range    Mixed hyperlipidemia 05/17/2015   COPD exacerbation ([place]) 05/17/2015   Essential hypertension 03/31/2015   Elevated troponin 03/20/2015   PCP:  Virginia Crews, MD Pharmacy:   [id number] [place], [place] - [address] [place] [address] Phone: [phone] Fax: [phone]     Social Determinants of Health  (SDOH) Interventions    Readmission Risk Interventions Readmission Risk Prevention Plan 05/22/2021 11/27/2020 11/26/2020  Transportation Screening Complete - Complete  PCP or Specialist Appt within 3-5 Days - - -  HRI or [place] - - -  Social Work Consult for [place] Planning/Counseling Complete - -  Palliative Care Screening Not Applicable - -  Medication Review Press photographer) Complete - Complete  PCP or Specialist appointment within 3-5 days of discharge - Complete Complete  HRI or [place] - - Complete  SW Recovery Care/Counseling Consult - - Complete  Palliative Care Screening - - Not [place] - - Not Applicable  Some recent data might be hidden

## 2021-05-22 NOTE — Progress Notes (Signed)
Progress Note  Patient Name: Gabriel [name]. Date of Encounter: 05/22/2021  Primary Cardiologist: Rockey Situ  Subjective   Dyspnea improved. Chronic right-sided chest pain is stable. Documented UOP 3 L for the past 24 hours, net - 2.4 L for the admission. Slight increase in renal function this morning. Has received IV Lasix 40 mg this morning, afternoon dose has been held. [name] was also held yesterday. Current weight 236 pounds with dry weight of approximately 235 pounds.   Inpatient Medications    Scheduled Meds:  carvedilol  6.25 mg Oral BID WC   ezetimibe  10 mg Oral Daily   fenofibrate  54 mg Oral Daily   influenza vac split quadrivalent PF  0.5 mL Intramuscular Tomorrow-1000   insulin aspart  0-15 Units Subcutaneous Q4H   insulin glargine-yfgn  15 Units Subcutaneous Q2200   lidocaine  1 patch Transdermal Q12H   loratadine  10 mg Oral Daily   nitroGLYCERIN  1 inch Topical Q6H   potassium chloride SA  40 mEq Oral Daily   rivaroxaban  20 mg Oral Q supper   rosuvastatin  20 mg Oral QHS   Continuous Infusions:  PRN Meds: acetaminophen **OR** acetaminophen, bisacodyl, HYDROcodone-acetaminophen, ipratropium-albuterol, metoprolol tartrate, morphine injection, nitroGLYCERIN, ondansetron **OR** ondansetron (ZOFRAN) IV, senna-docusate   Vital Signs    Vitals:   05/21/21 2315 05/22/21 0000 05/22/21 0235 05/22/21 0727  BP: 134/80  135/81 (!) 149/97  Pulse: 80  75 82  Resp: 18 (!) 25 18 19   Temp: 98.3 F (36.8 C)  98.1 F (36.7 C) 98.1 F (36.7 C)  TempSrc:    Oral  SpO2: 95%  96% 98%  Weight:      Height:        Intake/Output Summary (Last 24 hours) at 05/22/2021 1016 Last data filed at 05/22/2021 0813 Gross per 24 hour  Intake 960 ml  Output 3885 ml  Net -2925 ml   Filed Weights   05/21/21 1728  Weight: 107.2 kg    Telemetry    SR with PACs and PVCs - Personally Reviewed  ECG    No new tracings - Personally Reviewed  Physical Exam   GEN: No acute  distress.   Neck: No JVD. Cardiac: RRR, no murmurs, rubs, or gallops.  Respiratory: Clear to auscultation bilaterally.  GI: Soft, nontender, non-distended.   MS: No edema; No deformity. Neuro:  Alert and oriented x 3; Nonfocal.  Psych: Normal affect.  Labs    Chemistry Recent Labs  Lab 05/20/21 1143 05/21/21 0520 05/22/21 0414  NA 141 140 136  K 4.1 3.1* 3.7  CL 105 104 99  CO2 20* 28 29  GLUCOSE 298* 164* 222*  BUN 20 21* 25*  CREATININE 1.10 1.08 1.37*  CALCIUM 9.2 8.6* 9.3  GFRNONAA >60 >60 >60  ANIONGAP 16* 8 8     Hematology Recent Labs  Lab 05/20/21 1143 05/21/21 0520  WBC 7.9 8.3  RBC 5.79 6.06*  HGB 16.2 17.0  HCT 47.2 49.1  MCV 81.5 81.0  MCH 28.0 28.1  MCHC 34.3 34.6  RDW 14.8 14.9  PLT 162 179    Cardiac EnzymesNo results for input(s): TROPONINI in the last 168 hours. No results for input(s): TROPIPOC in the last 168 hours.   BNP Recent Labs  Lab 05/20/21 1404  BNP 2,525.3*     DDimer No results for input(s): DDIMER in the last 168 hours.   Radiology    DG Chest 2 View  Result Date:  05/20/2021 IMPRESSION: Findings are most consistent with mild cardiomegaly and mild pulmonary edema. Electronically Signed   By: Dorise Bullion III M.D.   On: 05/20/2021 12:52   DG Cervical Spine 2 or 3 views  Result Date: 05/21/2021 IMPRESSION: Mild degenerative change without acute abnormality. Electronically Signed   By: Inez Catalina M.D.   On: 05/21/2021 19:49   CT Angio Chest Aorta W and/or Wo Contrast  Result Date: 05/20/2021 IMPRESSION: No acute pulmonary embolus. Scattered bilateral patchy areas of ground-glass consolidation, nonspecific. Possibility of pulmonary edema or atypical infection not excluded Small bilateral pleural effusions. Persistent mediastinal adenopathy, predominately subcentimeter in size. Additionally there is a 2 cm node within the right infrahilar region. These are likely reactive in etiology. Recommend attention on follow-up.  Electronically Signed   By: Lovey Newcomer M.D.   On: 05/20/2021 16:11     Cardiac Studies   2D echo 05/21/2021:  1. Left ventricular ejection fraction, by estimation, is 40 to 45%. The  left ventricle has mildly decreased function. Left ventricular endocardial  border not optimally defined to evaluate regional wall motion. The left  ventricular internal cavity size  was mildly dilated. There is mild left ventricular hypertrophy. Left  ventricular diastolic parameters were normal.   2. Right ventricular systolic function is normal. The right ventricular  size is normal. Tricuspid regurgitation signal is inadequate for assessing  PA pressure.   3. Left atrial size was mild to moderately dilated.   4. Right atrial size was mildly dilated.   5. The mitral valve is normal in structure. Mild mitral valve  regurgitation. No evidence of mitral stenosis.   6. The aortic valve is normal in structure. Aortic valve regurgitation is  mild. Mild to moderate aortic valve sclerosis/calcification is present,  without any evidence of aortic stenosis.   Patient Profile     50 y.o. male with history of CAD, chronic chest pain with chronic troponin elevation, HFrEF secondary to NICM, NSVT, CKD stage III, COPD, chronic anticoagulation secondary to unprovoked PE, DM2, OSA declined CPAP, and obesity who is being seen today for the evaluation of acute on chronic HFrEF.  Assessment & Plan    1. Acute on chronic HFrEF secondary to NICM: -Volume status is improved -Hold PM dose of IV Lasix given AKI -Likely has a narrow therapeutic window -If renal function allows tomorrow, look to add back Entresto along with SLGT2i (may need to decrease torsemide dose with this in an effort to minimize risk of hypotension previously noted with [place]) -Cardiomyopathy is out of proportion to his CAD -Given frequent hospital admission for volume overload, he may benefit from CardioMEMs, recommend he re-establish with the  advanced heart failure clinic in [place] to discuss this further  -Daily standing scale weights -Strict I/O -Echo as above with stable cardiomyopathy    2. CAD with chronic atypical chest pain and chronically elevated troponin levels: -Currently, at his baseline with 3/10 right-sided chest pain -High sensitivity troponin is minimally elevated, and down trending, less than his prior levels and not consistent with ACS -No indication for heparin gtt at this time -Echo as above -No plans for inpatient ischemic evaluation at this time -Xarelto in place of ASA   3. History of recurrent unprovoked PE: -PTA Xarelto   4. Mediastinal adenopathy: -Will need outpatient follow up with his PCP   5. HTN: -Blood pressure well controlled  -Medications as above   6. HLD: -LDL 60 in 11/2020 -PTA Crestor and Zetia   7.  Obesity with OSA: -Weight loss is encouraged  -Has previously declined CPAP in the outpatient setting   For questions or updates, please contact [place] Please consult [email or web address] for contact info under Cardiology/STEMI.    Signed, Christell Faith, PA-C [facility] Pager: [phone] 05/22/2021, 10:16 AM

## 2021-05-23 DIAGNOSIS — I5023 Acute on chronic systolic (congestive) heart failure: Secondary | ICD-10-CM | POA: Diagnosis not present

## 2021-05-23 DIAGNOSIS — I25110 Atherosclerotic heart disease of native coronary artery with unstable angina pectoris: Secondary | ICD-10-CM | POA: Diagnosis not present

## 2021-05-23 LAB — BASIC METABOLIC PANEL
Anion gap: 9 (ref 5–15)
BUN: 29 mg/dL — ABNORMAL HIGH (ref 6–20)
CO2: 26 mmol/L (ref 22–32)
Calcium: 9.9 mg/dL (ref 8.9–10.3)
Chloride: 101 mmol/L (ref 98–111)
Creatinine, Ser: 1.04 mg/dL (ref 0.61–1.24)
GFR, Estimated: >60 mL/min (ref >60)
Glucose, Bld: 228 mg/dL — ABNORMAL HIGH (ref 70–99)
Potassium: 3.9 mmol/L (ref 3.5–5.1)
Sodium: 136 mmol/L (ref 135–145)

## 2021-05-23 LAB — GLUCOSE, CAPILLARY
Glucose-Capillary: 198 mg/dL — ABNORMAL HIGH (ref 70–99)
Glucose-Capillary: 238 mg/dL — ABNORMAL HIGH (ref 70–99)
Glucose-Capillary: 332 mg/dL — ABNORMAL HIGH (ref 70–99)

## 2021-05-23 MED ORDER — TRULICITY 0.75 MG/0.5ML ~~LOC~~ SOAJ: 0.7500 mg | SUBCUTANEOUS | 0 refills | Status: AC

## 2021-05-23 MED ORDER — BASAGLAR KWIKPEN 100 UNIT/ML ~~LOC~~ SOPN: 28.0000 [IU] | PEN_INJECTOR | Freq: Every evening | SUBCUTANEOUS | 0 refills | Status: DC

## 2021-05-23 MED ORDER — METOLAZONE 5 MG PO TABS: ORAL_TABLET | ORAL | 0 refills | Status: DC

## 2021-05-23 MED ORDER — TORSEMIDE 20 MG PO TABS
40.0000 mg | ORAL_TABLET | Freq: Every day | ORAL | Status: DC
  Administered 2021-05-23: 40 mg via ORAL
  Filled 2021-05-23: qty 2

## 2021-05-23 MED ORDER — SACUBITRIL-VALSARTAN 24-26 MG PO TABS
1.0000 | ORAL_TABLET | Freq: Two times a day (BID) | ORAL | Status: DC
  Administered 2021-05-23: 1 via ORAL
  Filled 2021-05-23: qty 1

## 2021-05-23 NOTE — Progress Notes (Addendum)
Progress Note  Patient Name: Gabriel [name]. Date of Encounter: 05/23/2021  Primary Cardiologist: [name], MD  Subjective   No chest pain or sob.  Feels back to baseline.  Can't think of any dietary indiscretions that might have led to admission.  Inpatient Medications    Scheduled Meds:  carvedilol  6.25 mg Oral BID WC   ezetimibe  10 mg Oral Daily   fenofibrate  54 mg Oral Daily   influenza vac split quadrivalent PF  0.5 mL Intramuscular Tomorrow-1000   insulin aspart  0-15 Units Subcutaneous Q4H   insulin aspart  4 Units Subcutaneous TID WC   insulin glargine-yfgn  28 Units Subcutaneous Q2200   lidocaine  1 patch Transdermal Q12H   loratadine  10 mg Oral Daily   nitroGLYCERIN  1 inch Topical Q6H   potassium chloride SA  40 mEq Oral Daily   rivaroxaban  20 mg Oral Q supper   rosuvastatin  20 mg Oral QHS   sacubitril-valsartan  1 tablet Oral BID   torsemide  40 mg Oral Daily   Continuous Infusions:  PRN Meds: acetaminophen **OR** acetaminophen, bisacodyl, HYDROcodone-acetaminophen, ipratropium-albuterol, metoprolol tartrate, morphine injection, nitroGLYCERIN, ondansetron **OR** ondansetron (ZOFRAN) IV, senna-docusate   Vital Signs    Vitals:   05/22/21 1945 05/23/21 0120 05/23/21 0519 05/23/21 0808  BP: (!) 132/98 140/86 (!) 137/94 (!) 150/86  Pulse: 81 67 84 84  Resp: (!) 22 15 (!) 22 19  Temp: 98.1 F (36.7 C) 98.5 F (36.9 C) 98.7 F (37.1 C) 98.2 F (36.8 C)  TempSrc: Oral Oral Oral   SpO2: 98% 98% 97% 99%  Weight:   107.2 kg   Height:        Intake/Output Summary (Last 24 hours) at 05/23/2021 0837 Last data filed at 05/22/2021 2000 Gross per 24 hour  Intake 2040 ml  Output 3250 ml  Net -1210 ml   Filed Weights   05/21/21 1728 05/22/21 1057 05/23/21 0519  Weight: 107.2 kg 108.4 kg 107.2 kg    Physical Exam   GEN: Obese, in no acute distress.  HEENT: Grossly normal.  Neck: Supple, no JVD, carotid bruits, or masses. Cardiac: RRR, no  murmurs, rubs, or gallops. No clubbing, cyanosis, edema.  DP/PT 2+ and equal bilaterally.  Respiratory:  Respirations regular and unlabored, clear to auscultation bilaterally. GI: Obese, soft, nontender, nondistended, BS + x 4. MS: no deformity or atrophy. Skin: warm and dry, no rash. Neuro:  Strength and sensation are intact. Psych: AAOx3.  Normal affect.  Labs    Chemistry Recent Labs  Lab 05/21/21 0520 05/22/21 0414 05/23/21 0607  NA 140 136 136  K 3.1* 3.7 3.9  CL 104 99 101  CO2 28 29 26   GLUCOSE 164* 222* 228*  BUN 21* 25* 29*  CREATININE 1.08 1.37* 1.04  CALCIUM 8.6* 9.3 9.9  GFRNONAA >60 >60 >60  ANIONGAP 8 8 9      Hematology Recent Labs  Lab 05/20/21 1143 05/21/21 0520  WBC 7.9 8.3  RBC 5.79 6.06*  HGB 16.2 17.0  HCT 47.2 49.1  MCV 81.5 81.0  MCH 28.0 28.1  MCHC 34.3 34.6  RDW 14.8 14.9  PLT 162 179    Cardiac Enzymes  Recent Labs  Lab 05/20/21 1143 05/20/21 1404 05/20/21 2357 05/21/21 0231  TROPONINIHS 69* 60* 54* 55*      BNP Recent Labs  Lab 05/20/21 1404  BNP 2,525.3*    Lipids  Lab Results  Component Value Date  CHOL 185 11/23/2020   HDL 19 (L) 11/23/2020   LDLCALC UNABLE TO CALCULATE IF TRIGLYCERIDE OVER 400 mg/dL 11/23/2020   LDLDIRECT 60.6 11/23/2020   TRIG 900 (H) 11/23/2020   CHOLHDL 9.7 11/23/2020    HbA1c  Lab Results  Component Value Date   HGBA1C 8.1 (H) 03/28/2021    Radiology    DG Chest 2 View  Result Date: 05/20/2021 CLINICAL DATA:  Shortness of breath.  Pain. EXAM: CHEST - 2 VIEW COMPARISON:  March 28, 2021 FINDINGS: Mild cardiomegaly. The hila and mediastinum are unremarkable. Increased interstitial markings, more prominent compared to March 01, 2021 but decreased since March 28, 2021. No pneumothorax. No nodules or masses. No other acute abnormalities. IMPRESSION: Findings are most consistent with mild cardiomegaly and mild pulmonary edema. Electronically Signed   By: Dorise Bullion III M.D.   On:  05/20/2021 12:52   DG Cervical Spine 2 or 3 views  Result Date: 05/21/2021 CLINICAL DATA:  Neck pain for 2 days following turning head, initial encounter EXAM: CERVICAL SPINE - 3 VIEW COMPARISON:  None. FINDINGS: Seven cervical segments are well visualized. Vertebral body height is well maintained. Osteophytic changes are noted from C4-C7. No anterolisthesis is seen. The odontoid is within normal limits. No soft tissue abnormality is seen. IMPRESSION: Mild degenerative change without acute abnormality. Electronically Signed   By: Inez Catalina M.D.   On: 05/21/2021 19:49   CT Angio Chest Aorta W and/or Wo Contrast  Result Date: 05/20/2021 CLINICAL DATA:  Chest pain.  Shortness of breath. EXAM: CT ANGIOGRAPHY CHEST WITH CONTRAST TECHNIQUE: Multidetector CT imaging of the chest was performed using the standard protocol during bolus administration of intravenous contrast. Multiplanar CT image reconstructions and MIPs were obtained to evaluate the vascular anatomy. CONTRAST:  136mL OMNIPAQUE IOHEXOL 350 MG/ML SOLN COMPARISON:  Chest radiograph earlier same day. FINDINGS: Cardiovascular: The heart is enlarged. Normal caliber thoracic aorta. Adequate opacification of pulmonary arterial system. No evidence for acute pulmonary embolus. Mediastinum/Nodes: Multiple prominent subcentimeter mediastinal lymph nodes are redemonstrated. 2 cm right infrahilar (image 46; series 4) similar to prior. Normal appearance of the esophagus. Lungs/Pleura: Central airways are patent. Patchy ground-glass opacities within the lungs bilaterally. Small bilateral pleural effusions. No pneumothorax. Upper Abdomen: No acute process. Musculoskeletal: Thoracic spine degenerative changes. No aggressive or acute appearing osseous lesions. Review of the MIP images confirms the above findings. IMPRESSION: No acute pulmonary embolus. Scattered bilateral patchy areas of ground-glass consolidation, nonspecific. Possibility of pulmonary edema or  atypical infection not excluded Small bilateral pleural effusions. Persistent mediastinal adenopathy, predominately subcentimeter in size. Additionally there is a 2 cm node within the right infrahilar region. These are likely reactive in etiology. Recommend attention on follow-up. Electronically Signed   By: Lovey Newcomer M.D.   On: 05/20/2021 16:11   Telemetry    RSR, PVCs, couplets - Personally Reviewed  Cardiac Studies   Cardiac Catheterization  10.2021  Left Main  Vessel is large. Vessel is angiographically normal.  Left Anterior Descending  Vessel is large. There is mild diffuse disease throughout the vessel.  Dist LAD lesion is 50% stenosed.  First Diagonal Branch  Vessel is large in size.  Second Diagonal Branch  Vessel is small in size.  Third Diagonal Branch  Vessel is small in size.  Left Circumflex  Vessel is large.  Prox Cx to Mid Cx lesion is 30% stenosed.  First Obtuse Marginal Branch  Vessel is large in size.  1st Mrg lesion is 40% stenosed.  Lateral  First Obtuse Marginal Branch  Vessel is small in size.  Third Obtuse Marginal Branch  Vessel is small in size.  Right Coronary Artery  Vessel is large.  Prox RCA lesion is 100% stenosed. The lesion is chronically occluded with left-to-right collateral flow.  Right Posterior Descending Artery  Collaterals  RPDA filled by collaterals from 2nd Sept.    Right Posterior Atrioventricular Artery  Collaterals  RPAV filled by collaterals from Dist Cx.  _____________   2D Echocardiogram 10.3.2022   1. Left ventricular ejection fraction, by estimation, is 40 to 45%. The  left ventricle has mildly decreased function. Left ventricular endocardial  border not optimally defined to evaluate regional wall motion. The left  ventricular internal cavity size  was mildly dilated. There is mild left ventricular hypertrophy. Left  ventricular diastolic parameters were normal.   2. Right ventricular systolic function is normal.  The right ventricular  size is normal. Tricuspid regurgitation signal is inadequate for assessing  PA pressure.   3. Left atrial size was mild to moderately dilated.   4. Right atrial size was mildly dilated.   5. The mitral valve is normal in structure. Mild mitral valve  regurgitation. No evidence of mitral stenosis.   6. The aortic valve is normal in structure. Aortic valve regurgitation is  mild. Mild to moderate aortic valve sclerosis/calcification is present,  without any evidence of aortic stenosis.  _____________   Patient Profile     50 y.o. male with history of CAD, chronic chest pain with chronic troponin elevation, HFrEF secondary to mixed ICM/NICM, NSVT, CKD stage III, COPD, chronic anticoagulation secondary to unprovoked PE, DM2, OSA declined CPAP, and obesity who is being seen today for the evaluation of acute on chronic HFrEF.  Assessment & Plan    1.  Acu.te on chronic HFrEF/Mixed ICM/NICM:  Admitted 10/2 w/ wt gain and volume overload despite taking additional torsemide @ home.  Lasix held last night due to rising creat.  Creat stable @ 1.04 this AM.  Minus 1.1L yesterday and 4.1L since admission.  Wt down to 107.2, which he identifies as his dry wt (was sl lower in April, but since has been much higher).  Feels well and has ambulate dw/o symptoms.  Euvolemic on exam.  Home dose of torsemide resumed this am - 40mg  daily.  Sounds like he's had success using prn metolazone 5mg  previously, but not as much success when taking additional torsemide.  Rec reducing threshold for using metolazone to wt > 240 lbs vs just adding metolazone 2.5mg  daily (would have to watch creat closely).  F/u in clinic in ~ 1wk. Will arrange for early outpt f/u.  We will refer back to CHF clinic in [place] for consideration of cardiomems.  Cont ? blocker and entresto (resumed this AM).  Was previously on farxiga but d/c'd 2/2 hypotension.  If BPs stable in outpt setting, will plan to retrial farxiga and/or  spiro.  2.  CAD/Demand Ischemia: Chronic atypical angina w/ chronic mild HsTrop elevation.  Cath last year w/ CTO of the RCA w/ mod, nonobs LAD and LCX dzs.  Peak HsTrop of 69 this admission.  No c/p this AM.  Presentation more consistent w/ demand ischemia, not ACS.  Cont ? blocker and statin.  No ASA in setting of chronic xarelto.  No plans for ischemic eval @ this time.  3.  H/o recurrent unprovoked PE/DVT:  Cont xarelto.  4.  Essential HTN:  BP elevated this AM. F/u after AM meds.  Entresto now resumed.  5.  HL:  LDL 60 in 11/2020.  Cont crestor/zetia.  6.  OSA:  Has declined CPAP previously.  7.  Mediastinal adenopathy:  Again noted on CT.  F/u outpt imaging per primary care.  Signed, Murray Hodgkins, NP  05/23/2021, 8:37 AM    For questions or updates, please contact   Please consult [email or web address] for contact info under Cardiology/STEMI.

## 2021-05-23 NOTE — Discharge Summary (Signed)
Physician Discharge Summary  Gabriel [name]. KDT:[id number] DOB: Jan 28, 1971 DOA: 05/20/2021  PCP: Virginia Crews, MD  Admit date: 05/20/2021 Discharge date: 05/23/2021  Discharge disposition: Home   Recommendations for Outpatient Follow-Up:   Follow up with PCP in 1 week. Follow up with [name] on 05/28/2021.   Discharge Diagnosis:   Principal Problem:   Atherosclerosis of native coronary artery of native heart with unstable angina pectoris ([place]) Active Problems:   Essential hypertension   Mixed hyperlipidemia   Acute on chronic systolic CHF (congestive heart failure) (HCC)   Type 2 diabetes mellitus with hyperlipidemia (HCC)   History of pulmonary embolism   COPD (chronic obstructive pulmonary disease) (HCC)   OSA on CPAP   Long term current use of anticoagulant   Mitral regurgitation   Mediastinal lymphadenopathy   Acute on chronic diastolic CHF (congestive heart failure) ([place])    Discharge Condition: Stable.  Diet recommendation:  Diet Order             Diet - low sodium heart healthy           Diet Carb Modified           Diet heart healthy/carb modified Room service appropriate? Yes; Fluid consistency: Thin  Diet effective now                     Code Status: Full Code     Hospital Course:   [name]. is a 50 y.o. 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  He presented to the hospital because of severe chest pain woke him up in the middle of the night.  It was  associated with nausea and shortness of breath.  He also complained of pain in the back of the neck that appears to radiate to the back of the head.   His troponins were mildly elevated (peak troponin was 69).  BNP was 2,525.3.  He was admitted to the hospital for acute exacerbation of chronic systolic CHF.  He was treated with IV Lasix.  IV lasix and Entresto were held because of elevated creatinine. Cardiologist was consulted to assist with management.  He has chronically elevated troponins and ACS was ruled out. His condition has improved and she is deemed stable for discharge today.     Medical Consultants:   Cardiologist   Discharge Exam:    Vitals:   05/22/21 1945 05/23/21 0120 05/23/21 0519 05/23/21 0808  BP: (!) 132/98 140/86 (!) 137/94 (!) 150/86  Pulse: 81 67 84 84  Resp: (!) 22 15 (!) 22 19  Temp: 98.1 F (36.7 C) 98.5 F (36.9 C) 98.7 F (37.1 C) 98.2 F (36.8 C)  TempSrc: Oral Oral Oral   SpO2: 98% 98% 97% 99%  Weight:   107.2 kg   Height:         GEN: NAD SKIN: Warm and dry EYES: EOMI ENT: MMM CV: RRR PULM: CTA B ABD: soft, obese, NT, +BS CNS: AAO x 3, non focal EXT: No edema or tenderness   The results of significant diagnostics from this hospitalization (including imaging, microbiology, ancillary  and laboratory) are listed below for reference.     Procedures and Diagnostic Studies:   DG Cervical Spine 2 or 3 views  Result Date: 05/21/2021 CLINICAL DATA:  Neck pain for 2 days following turning head, initial encounter EXAM: CERVICAL SPINE - 3 VIEW COMPARISON:  None. FINDINGS: Seven cervical segments are well visualized. Vertebral body height is well maintained. Osteophytic changes are noted from C4-C7. No anterolisthesis is seen. The odontoid is within normal limits. No soft tissue abnormality is seen. IMPRESSION: Mild degenerative change without acute abnormality. Electronically Signed   By: Inez Catalina M.D.   On: 05/21/2021 19:49   ECHOCARDIOGRAM  COMPLETE  Result Date: 05/21/2021    ECHOCARDIOGRAM REPORT   Patient Name:   Gabriel [name]. Date of Exam: 05/21/2021 Medical Rec #:  [id number]           Height:       72.0 in Accession #:    [id number]          Weight:       254.4 lb Date of Birth:  06-06-1971           BSA:          2.360 m Patient Age:    57 years            BP:           126/58 mmHg Patient Gender: M                   HR:           71 bpm. Exam Location:  [facility] Procedure: 2D Echo, Color Doppler, Cardiac Doppler and Intracardiac            Opacification Agent Indications:     I50.31 congestive heart failure-Acute Diastolic  History:         Patient has prior history of Echocardiogram examinations, most                  recent 06/08/2020. HFrEF, CAD, CKD, Signs/Symptoms:Shortness of                  Breath and Chest Pain; Risk Factors:Hypertension.  Sonographer:     Charmayne Sheer Referring Phys:  [id number] Tacey Ruiz Diagnosing Phys: Kathlyn Sacramento MD  Sonographer Comments: Suboptimal apical window and no subcostal window. Image acquisition challenging due to patient body habitus. IMPRESSIONS  1. Left ventricular ejection fraction, by estimation, is 40 to 45%. The left ventricle has mildly decreased function. Left ventricular endocardial border not optimally defined to evaluate regional wall motion. The left ventricular internal cavity size was mildly dilated. There is mild left ventricular hypertrophy. Left ventricular diastolic parameters were normal.  2. Right ventricular systolic function is normal. The right ventricular size is normal. Tricuspid regurgitation signal is inadequate for assessing PA pressure.  3. Left atrial size was mild to moderately dilated.  4. Right atrial size was mildly dilated.  5. The mitral valve is normal in structure. Mild mitral valve regurgitation. No evidence of mitral stenosis.  6. The aortic valve is normal in structure. Aortic valve regurgitation is mild. Mild to moderate aortic valve sclerosis/calcification  is present, without any evidence of aortic stenosis. FINDINGS  Left Ventricle: Left ventricular ejection fraction, by estimation, is 40 to 45%. The left ventricle has mildly decreased function. Left ventricular endocardial border not optimally defined to evaluate regional wall motion. Definity contrast agent was given IV to delineate the left ventricular endocardial borders. The  left ventricular internal cavity size was mildly dilated. There is mild left ventricular hypertrophy. Left ventricular diastolic parameters were normal. Right Ventricle: The right ventricular size is normal. No increase in right ventricular wall thickness. Right ventricular systolic function is normal. Tricuspid regurgitation signal is inadequate for assessing PA pressure. Left Atrium: Left atrial size was mild to moderately dilated. Right Atrium: Right atrial size was mildly dilated. Pericardium: There is no evidence of pericardial effusion. Mitral Valve: The mitral valve is normal in structure. Mild mitral valve regurgitation. No evidence of mitral valve stenosis. MV peak gradient, 2.1 mmHg. The mean mitral valve gradient is 1.0 mmHg. Tricuspid Valve: The tricuspid valve is normal in structure. Tricuspid valve regurgitation is not demonstrated. No evidence of tricuspid stenosis. Aortic Valve: The aortic valve is normal in structure. Aortic valve regurgitation is mild. Mild to moderate aortic valve sclerosis/calcification is present, without any evidence of aortic stenosis. Aortic valve mean gradient measures 3.0 mmHg. Aortic valve peak gradient measures 4.8 mmHg. Aortic valve area, by VTI measures 3.64 cm. Pulmonic Valve: The pulmonic valve was normal in structure. Pulmonic valve regurgitation is not visualized. No evidence of pulmonic stenosis. Aorta: The aortic root is normal in size and structure. Venous: The inferior vena cava was not well visualized. IAS/Shunts: No atrial level shunt detected by color flow Doppler.  LEFT VENTRICLE  PLAX 2D LVIDd:         6.20 cm  Diastology LVIDs:         5.00 cm  LV e' medial:    4.03 cm/s LV PW:         1.50 cm  LV E/e' medial:  14.7 LV IVS:        1.00 cm  LV e' lateral:   9.14 cm/s LVOT diam:     2.50 cm  LV E/e' lateral: 6.5 LV SV:         69 LV SV Index:   29 LVOT Area:     4.91 cm  RIGHT VENTRICLE RV Basal diam:  4.60 cm LEFT ATRIUM             Index       RIGHT ATRIUM           Index LA diam:        5.30 cm 2.25 cm/m  RA Area:     20.30 cm LA Vol (A2C):   48.1 ml 20.38 ml/m RA Volume:   62.50 ml  26.48 ml/m LA Vol (A4C):   48.2 ml 20.43 ml/m LA Biplane Vol: 48.6 ml 20.59 ml/m  AORTIC VALVE                   PULMONIC VALVE AV Area (Vmax):    3.64 cm    PV Vmax:       0.80 m/s AV Area (Vmean):   3.40 cm    PV Vmean:      56.700 cm/s AV Area (VTI):     3.64 cm    PV VTI:        0.115 m AV Vmax:           109.00 cm/s PV Peak grad:  2.6 mmHg AV Vmean:          82.700 cm/s PV Mean grad:  1.0 mmHg AV VTI:            0.189 m AV Peak Grad:      4.8 mmHg AV Mean Grad:      3.0 mmHg  LVOT Vmax:         80.90 cm/s LVOT Vmean:        57.300 cm/s LVOT VTI:          0.140 m LVOT/AV VTI ratio: 0.74  AORTA Ao Root diam: 3.80 cm MITRAL VALVE MV Area (PHT): 3.68 cm    SHUNTS MV Area VTI:   3.47 cm    Systemic VTI:  0.14 m MV Peak grad:  2.1 mmHg    Systemic Diam: 2.50 cm MV Mean grad:  1.0 mmHg MV Vmax:       0.72 m/s MV Vmean:      45.0 cm/s MV Decel Time: 206 msec MV E velocity: 59.10 cm/s MV A velocity: 48.40 cm/s MV E/A ratio:  1.22 Kathlyn Sacramento MD Electronically signed by Kathlyn Sacramento MD Signature Date/Time: 05/21/2021/3:03:54 PM    Final      Labs:   Basic Metabolic Panel: Recent Labs  Lab 05/20/21 1143 05/20/21 2357 05/21/21 0520 05/22/21 0414 05/23/21 0607  NA 141  --  140 136 136  K 4.1  --  3.1* 3.7 3.9  CL 105  --  104 99 101  CO2 20*  --  28 29 26   GLUCOSE 298*  --  164* 222* 228*  BUN 20  --  21* 25* 29*  CREATININE 1.10  --  1.08 1.37* 1.04  CALCIUM 9.2  --  8.6* 9.3 9.9   MG  --  1.6*  --  1.8  --    GFR Estimated Creatinine Clearance: 105.1 mL/min (by C-G formula based on SCr of 1.04 mg/dL). Liver Function Tests: No results for input(s): AST, ALT, ALKPHOS, BILITOT, PROT, ALBUMIN in the last 168 hours. No results for input(s): LIPASE, AMYLASE in the last 168 hours. No results for input(s): AMMONIA in the last 168 hours. Coagulation profile No results for input(s): INR, PROTIME in the last 168 hours.  CBC: Recent Labs  Lab 05/20/21 1143 05/21/21 0520  WBC 7.9 8.3  HGB 16.2 17.0  HCT 47.2 49.1  MCV 81.5 81.0  PLT 162 179   Cardiac Enzymes: No results for input(s): CKTOTAL, CKMB, CKMBINDEX, TROPONINI in the last 168 hours. BNP: Invalid input(s): POCBNP CBG: Recent Labs  Lab 05/22/21 1631 05/22/21 1922 05/23/21 0024 05/23/21 0515 05/23/21 0806  GLUCAP 269* 271* 332* 238* 198*   D-Dimer No results for input(s): DDIMER in the last 72 hours. Hgb A1c No results for input(s): HGBA1C in the last 72 hours. Lipid Profile No results for input(s): CHOL, HDL, LDLCALC, TRIG, CHOLHDL, LDLDIRECT in the last 72 hours. Thyroid function studies No results for input(s): TSH, T4TOTAL, T3FREE, THYROIDAB in the last 72 hours.  Invalid input(s): FREET3 Anemia work up No results for input(s): VITAMINB12, FOLATE, FERRITIN, TIBC, IRON, RETICCTPCT in the last 72 hours. Microbiology Recent Results (from the past 240 hour(s))  Resp Panel by RT-PCR (Flu A&B, Covid) Nasopharyngeal Swab     Status: None   Collection Time: 05/20/21  2:04 PM   Specimen: Nasopharyngeal Swab; Nasopharyngeal(NP) swabs in vial transport medium  Result Value Ref Range Status   SARS Coronavirus 2 by RT PCR NEGATIVE NEGATIVE Final    Comment: (NOTE) SARS-CoV-2 target nucleic acids are NOT DETECTED.  The SARS-CoV-2 RNA is generally detectable in upper respiratory specimens during the acute phase of infection. The lowest concentration of SARS-CoV-2 viral copies this assay can detect  is 138 copies/mL. A negative result does not preclude SARS-Cov-2 infection and should not be used as the  sole basis for treatment or other patient management decisions. A negative result may occur with  improper specimen collection/handling, submission of specimen other than nasopharyngeal swab, presence of viral mutation(s) within the areas targeted by this assay, and inadequate number of viral copies(<138 copies/mL). A negative result must be combined with clinical observations, patient history, and epidemiological information. The expected result is Negative.  Fact Sheet for Patients:  EntrepreneurPulse.com.au  Fact Sheet for Healthcare Providers:  IncredibleEmployment.be  This test is no t yet approved or cleared by the [place] FDA and  has been authorized for detection and/or diagnosis of SARS-CoV-2 by FDA under an Emergency Use Authorization (EUA). This EUA will remain  in effect (meaning this test can be used) for the duration of the COVID-19 declaration under Section 564(b)(1) of the Act, 21 U.S.C.section 360bbb-3(b)(1), unless the authorization is terminated  or revoked sooner.       Influenza A by PCR NEGATIVE NEGATIVE Final   Influenza B by PCR NEGATIVE NEGATIVE Final    Comment: (NOTE) The Xpert Xpress SARS-CoV-2/FLU/RSV plus assay is intended as an aid in the diagnosis of influenza from Nasopharyngeal swab specimens and should not be used as a sole basis for treatment. Nasal washings and aspirates are unacceptable for Xpert Xpress SARS-CoV-2/FLU/RSV testing.  Fact Sheet for Patients: EntrepreneurPulse.com.au  Fact Sheet for Healthcare Providers: IncredibleEmployment.be  This test is not yet approved or cleared by the [place] FDA and has been authorized for detection and/or diagnosis of SARS-CoV-2 by FDA under an Emergency Use Authorization (EUA). This EUA will remain in effect  (meaning this test can be used) for the duration of the COVID-19 declaration under Section 564(b)(1) of the Act, 21 U.S.C. section 360bbb-3(b)(1), unless the authorization is terminated or revoked.  Performed at [facility], [address], [place], [place] [address]      Discharge Instructions:   Discharge Instructions     Amb Referral to HF Clinic   Complete by: As directed    Diet - low sodium heart healthy   Complete by: As directed    Diet Carb Modified   Complete by: As directed    Increase activity slowly   Complete by: As directed       Allergies as of 05/23/2021       Reactions   Metformin And Related Nausea And Vomiting   Prednisone Other (See Comments)   Reaction: Hallucinations    Zantac [ranitidine Hcl] Diarrhea, Nausea Only   Night sweats        Medication List     STOP taking these medications    lidocaine 5 % Commonly known as: Lidoderm   oxyCODONE-acetaminophen 5-325 MG tablet Commonly known as: Percocet       TAKE these medications    Basaglar KwikPen 100 UNIT/ML Inject 28 Units into the skin at bedtime.   carvedilol 6.25 MG tablet Commonly known as: COREG Take 1 tablet (6.25 mg total) by mouth 2 (two) times daily with a meal.   cetirizine 10 MG tablet Commonly known as: ZYRTEC Take 10 mg by mouth daily.   Combivent Respimat 20-100 MCG/ACT Aers respimat Generic drug: Ipratropium-Albuterol Inhale 1 puff into the lungs every 6 (six) hours as needed for wheezing.   ezetimibe 10 MG tablet Commonly known as: ZETIA Take 1 tablet (10 mg total) by mouth daily.   fenofibrate 145 MG tablet Commonly known as: Tricor Take 1 tablet (145 mg total) by mouth daily.   insulin lispro 100 UNIT/ML KwikPen Commonly  known as: HUMALOG Inject 3 Units into the skin 3 (three) times daily.   metolazone 5 MG tablet Commonly known as: ZAROXOLYN TAKE 1 TABLET (5 MG TOTAL) BY MOUTH DAILY AS NEEDED (FOR WEIGHT OVER 240). What changed:  See the new instructions.   nitroGLYCERIN 0.4 MG SL tablet Commonly known as: NITROSTAT Place 1 tablet (0.4 mg total) under the tongue every 5 (five) minutes x 3 doses as needed for chest pain.   potassium chloride SA 20 MEQ tablet Commonly known as: Klor-Con M20 TAKE 1 TABLET (20 MEQ) ONCE A DAY ONLY WHEN TAKING TORSEMIDE 40 MG TWICE DAILY   rivaroxaban 20 MG Tabs tablet Commonly known as: XARELTO Take 1 tablet (20 mg total) by mouth daily with supper.   rosuvastatin 20 MG tablet Commonly known as: CRESTOR Take 1 tablet (20 mg total) by mouth at bedtime.   sacubitril-valsartan 49-51 MG Commonly known as: ENTRESTO Take 0.5 tablets by mouth 2 (two) times daily.   torsemide 20 MG tablet Commonly known as: DEMADEX Take 2 tablets (40 mg total) by mouth daily. Take an extra 2 tablets (40MG ) as needed for shortness of breath and/or for weight 242 lb or greater.   Trulicity 3.81 WE/9.9BZ Sopn Generic drug: Dulaglutide Inject 0.75 mg into the skin once a week.        Follow-up Information     Minna Merritts, MD Follow up on 05/28/2021.   Specialty: Cardiology Why: @ 2:30pm Contact information: [place] [address] [phone]                   If you experience worsening of your admission symptoms, develop shortness of breath, life threatening emergency, suicidal or homicidal thoughts you must seek medical attention immediately by calling 911 or calling your MD immediately  if symptoms less severe.   You must read complete instructions/literature along with all the possible adverse reactions/side effects for all the medicines you take and that have been prescribed to you. Take any new medicines after you have completely understood and accept all the possible adverse reactions/side effects.    Please note   You were cared for by a hospitalist during your hospital stay. If you have any questions about your discharge medications or  the care you received while you were in the hospital after you are discharged, you can call the unit and asked to speak with the hospitalist on call if the hospitalist that took care of you is not available. Once you are discharged, your primary care physician will handle any further medical issues. Please note that NO REFILLS for any discharge medications will be authorized once you are discharged, as it is imperative that you return to your primary care physician (or establish a relationship with a primary care physician if you do not have one) for your aftercare needs so that they can reassess your need for medications and monitor your lab values.       Time coordinating discharge: 32 minutes  Signed:  Iosefa Weintraub  [facility] 05/23/2021, 10:01 AM   Pager on [email or web address]. If 7PM-7AM, please contact night-coverage at [email or web address]

## 2021-05-25 ENCOUNTER — Telehealth: Payer: Self-pay

## 2021-05-25 NOTE — Telephone Encounter (Signed)
Transition Care Management Follow-up Telephone Call Date of discharge and from where: 05/23/2021  [facility] How have you been since you were released from the hospital? " Good" Any questions or concerns? No  Items Reviewed: Did the pt receive and understand the discharge instructions provided? Yes  Medications obtained and verified? Yes  Other? Yes   Reports out of insulin. Encouraged patient to call pharmacy for refills.  Any new allergies since your discharge? No  Dietary orders reviewed? Yes Do you have support at home?   Home Care and Equipment/Supplies: Were home health services ordered? not applicable If so, what is the name of the agency?  Has the agency set up a time to come to the patient's home?  Were any new equipment or medical supplies ordered?   What is the name of the medical supply agency?  Were you able to get the supplies/equipment?  Do you have any questions related to the use of the equipment or supplies?   Functional Questionnaire: (I = Independent and D = Dependent) ADLs: I  Bathing/Dressing- I  Meal Prep- I  Eating- I  Maintaining continence- I  Transferring/Ambulation- I  Managing Meds- I  Follow up appointments reviewed: PCP Hospital f/u appt confirmed? No   Specialist Hospital f/u appt confirmed? Yes  Scheduled to see Cardiology on 10/11 Are transportation arrangements needed?  If their condition worsens, is the pt aware to call PCP or go to the Emergency Dept.? Yes Was the patient provided with contact information for the PCP's office or ED? Yes Was to pt encouraged to call back with questions or concerns? Yes  Tomasa Rand, RN, BSN, CEN [facility] [employer] [phone]

## 2021-05-28 ENCOUNTER — Ambulatory Visit (INDEPENDENT_AMBULATORY_CARE_PROVIDER_SITE_OTHER): Payer: Medicaid Other | Admitting: Medical

## 2021-05-28 ENCOUNTER — Encounter: Payer: Self-pay | Admitting: Medical

## 2021-05-28 ENCOUNTER — Other Ambulatory Visit: Payer: Self-pay

## 2021-05-28 VITALS — BP 120/70 | HR 77 | Ht 72.0 in | Wt 240.5 lb

## 2021-05-28 DIAGNOSIS — I428 Other cardiomyopathies: Secondary | ICD-10-CM

## 2021-05-28 DIAGNOSIS — I5022 Chronic systolic (congestive) heart failure: Secondary | ICD-10-CM

## 2021-05-28 DIAGNOSIS — Z79899 Other long term (current) drug therapy: Secondary | ICD-10-CM

## 2021-05-28 DIAGNOSIS — I10 Essential (primary) hypertension: Secondary | ICD-10-CM

## 2021-05-28 DIAGNOSIS — E782 Mixed hyperlipidemia: Secondary | ICD-10-CM | POA: Diagnosis not present

## 2021-05-28 DIAGNOSIS — I2510 Atherosclerotic heart disease of native coronary artery without angina pectoris: Secondary | ICD-10-CM

## 2021-05-28 DIAGNOSIS — R0602 Shortness of breath: Secondary | ICD-10-CM | POA: Diagnosis not present

## 2021-05-28 MED ORDER — EMPAGLIFLOZIN 10 MG PO TABS: 10.0000 mg | ORAL_TABLET | Freq: Every day | ORAL | 6 refills | Status: DC

## 2021-05-28 NOTE — Progress Notes (Signed)
Cardiology Office Note:    Date:  05/28/2021   ID:  Gabriel [name]., DOB [birth year], MRN [id number]  PCP:  Virginia Crews, MD  [facility] Cardiologist:  [name], MD  [facility] Electrophysiologist:  None   Referring MD: Virginia Crews, MD   Chief Complaint: Hospital follow-up  History of Present Illness:    Gabriel [name]. is a 50 y.o. male with a hx of  CAD, chronic chest pain with chronic troponin elevation, HFrEF secondary to NICM, NSVT, CKD stage III, COPD, chronic anticoagulation secondary to unprovoked PE, DM2, OSA declined CPAP, and obesity who is being seen today for the evaluation of hospital follow-up.   Gabriel [name] has had multiple hospital admissions over the years for management of HFrEF, chest pain, elevated troponin levels, and COPD. Prior stress test in 2016 was nonischemic. More recently, he was admitted in 05/2020 with chest pain and elevated troponin, above his baseline and underwent R/LHC, which demonstrated CTO of the RCA with left-to-right collaterals. Echo at that time showed an EF of 35-40% with global hypokinesis. He was medically managed and has been intermittently followed by the advanced heart failure team in [place]. In 11/2020, he was admitted to [facility] secondary to near syncope. He was found to have AKI with creatinine of 2.44 (baseline 1.2). Torsemide dose was reduced to 40 mg daily and he was subsequently discharged but then returned to the ED the following day with weakness and hypotension. He was subsequently admitted for acute kidney injury, dyspnea, and chronic HFrEF. He was discharged home off of Entresto and spironolactone with reduced dose of torsemide. He was admitted to [facility] in 03/2021 with abdominal pain with imaging showing concern for a calculus cholecystitis.  General surgery initially plan on cholecystectomy however reconsidered and pursued HIDA scan. HIDA scan was negative and the cystic duct was patent, therefore  making cholecystectomy unlikely needed. With this, he left AMA. He was seen in the [facility] ED two days later with continued abdominal pain and was treated for volume overload.    He was last seen in the office on 04/04/2021 with volume overload with a weight that was up 21 pounds when compared to his visit in 11/2020 (233-->254 pounds). This was felt to be in the setting of his diuretic previously being decreased. He was given IV Lasix 80 mg in the office and advised to increase torsemide to 60 mg and take metolazone 5 mg x 2 days, followed by decreasing of torsemide to 40 mg daily thereafter with a noted sliding scale diuretic dosage on phone note dated 04/05/2021. WIth this, he noted improvement in his weight back to baseline of 236 pounds.  Hospitalized 10/3-10/5 for chest pain with minimally elevted trop and BNP >2000. He was treated with IV lasix.  Today, the patient reports he has been doing well since being discharged. Reports breathing is stable. Weights have been the same, around 240lbs, although baseline is around 235lbs. No LLE, orthopnea, pnd. No chest pain. He is on torsemide 40mg  daily a day and takes metolazone as needed. He is very careful at watching his weight. He is taking Entresto.He follows low salt diet, elevates his legs.   Past Medical History:  Diagnosis Date   CAD (coronary artery disease)    a. 04/2015 low risk MV;  b. 12/2016 Cath: minor irregs in LAD/Diag/LCX/OM, RCA 40p/m/d; c. 05/2020 Cath: LM nl, LAD 50d, LCX 30p, OM1 40, RCA 100p w/ L->L collats. CO/CI 3.1/1.3-->Med rx.  Chest wall pain, chronic    CHF (congestive heart failure) (HCC)    Chronic Troponin Elevation    CKD (chronic kidney disease), stage II-III    COPD (chronic obstructive pulmonary disease) (HCC)    Diabetes mellitus without complication (HCC)    HFrEF (heart failure with reduced ejection fraction) ([place])    Hypertension    Mitral regurgitation    Mild to moderate by October 2021 echocardiogram.   Mixed  Ischemic & NICM (nonischemic cardiomyopathy) ([place])    a. 03/2015 Echo: EF 45-50%; b. 12/2015 Echo: EF 20-25%; c. 02/2016 Echo: EF 30-35%; d. 11/2016 Echo: EF 40-45%; e. 06/2019 Echo: EF 30-35%; f. 11/2019 Echo: EF 25-30%; g. 05/2020 Echo: EF 35-40%, glob HK, nl RV size/fxn, PASP 44.43mmHg, mod dil LA. Mild to mod MR.   Myocardial infarct (HCC)    NSVT (nonsustained ventricular tachycardia)    a. 12/2015 noted on tele-->amio;  b. 12/2015 Event monitor: no VT noted.   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  12/31/19: RUL and RLL PEs.   Syncope    a. 01/2016 - felt to be vasovagal.    Past Surgical History:  Procedure Laterality Date   AMPUTATION     CARDIAC CATHETERIZATION     FINGER AMPUTATION     Traumatic   FINGER FRACTURE SURGERY Left    LEFT HEART CATH AND CORONARY ANGIOGRAPHY N/A 01/06/2017   Procedure: Left Heart Cath and Coronary Angiography;  Surgeon: Wellington Hampshire, MD;  Location: [facility];  Service: Cardiovascular;  Laterality: N/A;   RIGHT/LEFT HEART CATH AND CORONARY ANGIOGRAPHY N/A 06/13/2020   Procedure: RIGHT/LEFT HEART CATH AND CORONARY ANGIOGRAPHY;  Surgeon: Nelva Bush, MD;  Location: [facility];  Service: Cardiovascular;  Laterality: N/A;    Current Medications: Current Meds  Medication Sig   carvedilol (COREG) 6.25 MG tablet Take 1 tablet (6.25 mg total) by mouth 2 (two) times daily with a meal.   cetirizine (ZYRTEC) 10 MG tablet Take 10 mg by mouth daily.   Dulaglutide (TRULICITY) 3.00 TM/2.2QJ SOPN Inject 0.75 mg into the skin once a week.   empagliflozin (JARDIANCE) 10 MG TABS tablet Take 1 tablet (10 mg total) by mouth daily before breakfast.   ezetimibe (ZETIA) 10 MG tablet Take 1 tablet (10 mg total) by mouth daily.   fenofibrate (TRICOR) 145 MG tablet Take 1 tablet (145 mg total) by mouth daily.   Insulin Glargine (BASAGLAR KWIKPEN) 100 UNIT/ML Inject 28 Units  into the skin at bedtime.   insulin lispro (HUMALOG) 100 UNIT/ML KwikPen Inject 3 Units into the skin 3 (three) times daily.   Ipratropium-Albuterol (COMBIVENT RESPIMAT) 20-100 MCG/ACT AERS respimat Inhale 1 puff into the lungs every 6 (six) hours as needed for wheezing.   metolazone (ZAROXOLYN) 5 MG tablet TAKE 1 TABLET (5 MG TOTAL) BY MOUTH DAILY AS NEEDED (FOR WEIGHT OVER 240).   nitroGLYCERIN (NITROSTAT) 0.4 MG SL tablet Place 1 tablet (0.4 mg total) under the tongue every 5 (five) minutes x 3 doses as needed for chest pain.   potassium chloride SA (KLOR-CON M20) 20 MEQ tablet TAKE 1 TABLET (20 MEQ) ONCE A DAY ONLY WHEN TAKING TORSEMIDE 40 MG TWICE DAILY   rivaroxaban (XARELTO) 20 MG TABS tablet Take 1 tablet (20 mg total) by mouth daily with supper.   rosuvastatin (CRESTOR) 20 MG tablet Take 1 tablet (20 mg total) by mouth at bedtime.  sacubitril-valsartan (ENTRESTO) 49-51 MG Take 0.5 tablets by mouth 2 (two) times daily.   torsemide (DEMADEX) 20 MG tablet Take 2 tablets (40 mg total) by mouth daily. Take an extra 2 tablets (40MG ) as needed for shortness of breath and/or for weight 242 lb or greater.     Allergies:   Metformin and related, Prednisone, and Zantac [ranitidine hcl]   Social History   Socioeconomic History   Marital status: Widowed    Spouse name: Not on file   Number of children: 1   Years of education: 63   Highest education level: 12th grade  Occupational History   Occupation: disabled  Tobacco Use   Smoking status: Former    Packs/day: 0.50    Years: 33.00    Pack years: 16.50    Types: Cigarettes    Quit date: 05/08/2020    Years since quitting: 1.0   Smokeless tobacco: Former    Quit date: 05/08/2020   Tobacco comments:    Quit Sept 2021  Vaping Use   Vaping Use: Former  Substance and Sexual Activity   Alcohol use: Yes    Alcohol/week: 0.0 standard drinks    Comment: occassionally   Drug use: No   Sexual activity: Not Currently  Other Topics Concern    Not on file  Social History Narrative   Lives at home with wife and his dad's wife.        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    Social Determinants of Health   Financial Resource Strain: Not on file  Food Insecurity: Not on file  Transportation Needs: Not on file  Physical Activity: Not on file  Stress: Not on file  Social Connections: Not on file     Family History: The patient's family history includes Cancer in his maternal grandfather and paternal aunt; Diabetes in his maternal grandfather and mother; Diabetes Mellitus II in his mother; Gout in his father; Heart attack in his mother; Hypertension in his father and mother; Hypothyroidism in his mother; Kidney failure in his mother.  ROS:   Please see the history of present illness.     All other systems reviewed and are negative.  EKGs/Labs/Other Studies Reviewed:    The following studies were reviewed today:  2D Echocardiogram 10.3.2022   1. Left ventricular ejection fraction, by estimation, is 40 to 45%. The  left ventricle has mildly decreased function. Left ventricular endocardial  border not optimally defined to evaluate regional wall motion. The left  ventricular internal cavity size  was mildly dilated. There is mild left ventricular hypertrophy. Left  ventricular diastolic parameters were normal.   2. Right ventricular systolic function is normal. The right ventricular  size is normal. Tricuspid regurgitation signal is inadequate for assessing  PA pressure.   3. Left atrial size was mild to moderately dilated.   4. Right atrial size was mildly dilated.   5. The mitral valve is normal in structure. Mild mitral valve  regurgitation. No evidence of mitral stenosis.   6. The aortic valve is normal in structure. Aortic valve regurgitation is  mild. Mild to moderate aortic valve sclerosis/calcification is present,  without any evidence of aortic  stenosis.    EKG:  EKG is  ordered today.  The ekg ordered today demonstrates NSR, 77bpm, PVC, nonspecific T wave changes  Recent Labs: 11/23/2020: TSH 1.211 03/29/2021: ALT 17 05/20/2021: B Natriuretic Peptide 2,525.3 05/21/2021: Hemoglobin 17.0; Platelets 179  05/22/2021: Magnesium 1.8 05/23/2021: BUN 29; Creatinine, Ser 1.04; Potassium 3.9; Sodium 136  Recent Lipid Panel    Component Value Date/Time   CHOL 185 11/23/2020 2101   CHOL 160 03/08/2020 0951   TRIG 900 (H) 11/23/2020 2101   HDL 19 (L) 11/23/2020 2101   HDL 23 (L) 03/08/2020 0951   CHOLHDL 9.7 11/23/2020 2101   VLDL UNABLE TO CALCULATE IF TRIGLYCERIDE OVER 400 mg/dL 11/23/2020 2101   LDLCALC UNABLE TO CALCULATE IF TRIGLYCERIDE OVER 400 mg/dL 11/23/2020 2101   LDLCALC 42 03/08/2020 0951   LDLDIRECT 60.6 11/23/2020 2101      Physical Exam:    VS:  BP 120/70 (BP Location: Left Arm, Patient Position: Sitting, Cuff Size: Normal)   Pulse 77   Ht 6' (1.829 m)   Wt 240 lb 8 oz (109.1 kg)   SpO2 98%   BMI 32.62 kg/m     Wt Readings from Last 3 Encounters:  05/28/21 240 lb 8 oz (109.1 kg)  05/23/21 236 lb 5.3 oz (107.2 kg)  04/04/21 254 lb 6 oz (115.4 kg)     GEN:  Well nourished, well developed in no acute distress HEENT: Normal NECK: No JVD; No carotid bruits LYMPHATICS: No lymphadenopathy CARDIAC: RRR, no murmurs, rubs, gallops RESPIRATORY:  Clear to auscultation without rales, wheezing or rhonchi  ABDOMEN: Soft, non-tender, non-distended MUSCULOSKELETAL:  No edema; No deformity  SKIN: Warm and dry NEUROLOGIC:  Alert and oriented x 3 PSYCHIATRIC:  Normal affect   ASSESSMENT:    1. Chronic systolic CHF (congestive heart failure) ([place])   2. NICM (nonischemic cardiomyopathy) ([place])   3. Coronary artery disease involving native coronary artery of native heart without angina pectoris   4. Essential hypertension   5. Shortness of breath   6. Medication management   7. Hyperlipidemia, mixed    PLAN:    In  order of problems listed above:  HFrEF Mixed ICM/NICM Recent hospitalization for CHF. Has been doing well since discharge with stable weights. Says baseline weight is 235lbs, he is around 240lbs. He takes Torsemide 40mg  daily, and metolazone 5mg  as needed. Can take a metolazone to get back down to baseline weight. CHF education encouraged. Continue Entresto and Coreg. Add on Jardiance. BMET in a week and follow-up in a month. Will refer to CHF clinic for Cardiomems consideration. Continue GDMT as tolerated.   CAD He has chronic atypical chest pain. Recent demand ischemia in the setting of acute CHF. HE denies chest pain. Cath last year showed CTO of the RCA with mod, nonobs LAD and Lcx dzs.No plan for ischemic work-up at this time. Continue BB, statin. Zetia. No ASA with Xarelto.   H/o unprovoked PE/DVT Continue Xarelto  HTN BP normal today. Continue Coreg 6.25mg  BID, Entresto 49-51mg  BID. Add Jardiance as above.   HL LDL 60 in 11/2020. Continue Crestor and Zetia  OSA He reports compliance with CPAP.  Disposition: Follow up in 1 month(s) with MD/APP    Signed, Fortunato Nordin Ninfa Meeker, PA-C  05/28/2021 3:08 PM    [facility]

## 2021-05-28 NOTE — Patient Instructions (Addendum)
Medication Instructions:  - Your physician has recommended you make the following change in your medication:   1) START Jardiance 10 mg- take 1 tablet by mouth once daily   Samples Given: Jardiance 10 mg Lot: [id number] Exp: May 2024 # 2 boxed   *If you need a refill on your cardiac medications before your next appointment, please call your pharmacy*   Lab Work: - Your physician recommends that you return for lab work in: 1 week- BMP  If you have labs (blood work) drawn today and your tests are completely normal, you will receive your results only by: [place] (if you have MyChart) OR A paper copy in the mail If you have any lab test that is abnormal or we need to change your treatment, we will call you to review the results.   Testing/Procedures: - You have been referred to : the [facility] in [place]  - They will reach out to you directly to schedule an appointment, but if you have not heard from them in the next 2 weeks, please call [phone] to follow up.    Follow-Up: At [facility], you and your health needs are our priority.  As part of our continuing mission to provide you with exceptional heart care, we have created designated Provider Care Teams.  These Care Teams include your primary Cardiologist (physician) and Advanced Practice Providers (APPs -  Physician Assistants and Nurse Practitioners) who all work together to provide you with the care you need, when you need it.  We recommend signing up for the patient portal called "MyChart".  Sign up information is provided on this After Visit Summary.  MyChart is used to connect with patients for Virtual Visits (Telemedicine).  Patients are able to view lab/test results, encounter notes, upcoming appointments, etc.  Non-urgent messages can be sent to your provider as well.   To learn more about what you can do with MyChart, go to [email or web address].    Your next appointment:   1  month(s)  The format for your next appointment:   In Person  Provider:   You may see [name], MD or one of the following Advanced Practice Providers on your designated Care Team:   Murray Hodgkins, NP Christell Faith, PA-C Marrianne Mood, PA-C Cadence Kathlen Mody, [place]   Other Instructions  JARDIANCE (Empagliflozin) Tablets What is this medication? EMPAGLIFLOZIN (EM pa gli FLOE zin) treats type 2 diabetes. It works by helping your kidneys remove sugar (glucose) from your blood through the urine, which decreases your blood sugar. It can also be used to lower the risk of heart attack, stroke, and hospitalization for heart failure in people with type 2 diabetes. Changes to diet and exercise are often combined with this medication. This medicine may be used for other purposes; ask your health care provider or pharmacist if you have questions. COMMON BRAND NAME(S): Jardiance What should I tell my care team before I take this medication? They need to know if you have any of these conditions: Dehydration Diabetic ketoacidosis Diet low in salt Eating less due to illness, surgery, dieting, or any other reason Having surgery High cholesterol High levels of potassium in the blood History of pancreatitis or pancreas problems History of yeast infection of the penis or vagina If you often drink alcohol Infections in the bladder, kidneys, or urinary tract Kidney disease Liver disease Low blood pressure On hemodialysis Problems urinating Type 1 diabetes Uncircumcised male An unusual or allergic reaction to empagliflozin, other  medications, foods, dyes, or preservatives Pregnant or trying to get pregnant Breast-feeding How should I use this medication? Take this medication by mouth with water. Take it as directed on the prescription label at the same time every day. You may take it with or without food. Keep taking it unless your care team tells you to stop. A special MedGuide will be  given to you by the pharmacist with each prescription and refill. Be sure to read this information carefully each time. Talk to your care team about the use of this medication in children. Special care may be needed. Overdosage: If you think you have taken too much of this medicine contact a poison control center or emergency room at once. NOTE: This medicine is only for you. Do not share this medicine with others. What if I miss a dose? If you miss a dose, take it as soon as you can. If it is almost time for your next dose, take only that dose. Do not take double or extra doses. What may interact with this medication? Alcohol Diuretics Insulin This list may not describe all possible interactions. Give your health care provider a list of all the medicines, herbs, non-prescription drugs, or dietary supplements you use. Also tell them if you smoke, drink alcohol, or use illegal drugs. Some items may interact with your medicine. What should I watch for while using this medication? Visit your care team for regular checks on your progress. Tell your care team if your symptoms do not start to get better or if they get worse. This medication can cause a serious condition in which there is too much acid in the blood. If you develop nausea, vomiting, stomach pain, unusual tiredness, or breathing problems, stop taking this medication and call your care team right away. If possible, use a ketone dipstick to check for ketones in your urine. Check with your care team if you have severe diarrhea, nausea, and vomiting, or if you sweat a lot. The loss of too much body fluid may make it dangerous for you to take this medication. A test called the HbA1C (A1C) will be monitored. This is a simple blood test. It measures your blood sugar control over the last 2 to 3 months. You will receive this test every 3 to 6 months. Learn how to check your blood sugar. Learn the symptoms of low and high blood sugar and how to manage  them. Always carry a quick-source of sugar with you in case you have symptoms of low blood sugar. Examples include hard sugar candy or glucose tablets. Make sure others know that you can choke if you eat or drink when you develop serious symptoms of low blood sugar, such as seizures or unconsciousness. Get medical help at once. Tell your care team if you have high blood sugar. You might need to change the dose of your medication. If you are sick or exercising more than usual, you may need to change the dose of your medication. What side effects may I notice from receiving this medication? Side effects that you should report to your care team as soon as possible: Allergic reactions-skin rash, itching, hives, swelling of the face, lips, tongue, or throat Dehydration-increased thirst, dry mouth, feeling faint or lightheaded, headache, dark yellow or brown urine Diabetic ketoacidosis (DKA)-increased thirst or amount of urine, dry mouth, fatigue, fruity odor to breath, trouble breathing, stomach pain, nausea, vomiting Genital yeast infection-redness, swelling, pain, or itchiness, odor, thick or lumpy discharge New pain or  tenderness, change in skin color, sores or ulcers, infection of the leg or foot Infection or redness, swelling, tenderness, or pain in the genitals, or area from the genitals to the back of the rectum Urinary tract infection (UTI)-burning when passing urine, passing frequent small amounts of urine, bloody or cloudy urine, pain in the lower back or sides This list may not describe all possible side effects. Call your doctor for medical advice about side effects. You may report side effects to FDA at 1-800-FDA-1088. Where should I keep my medication? Keep out of the reach of children and pets. Store at room temperature between 20 and 25 degrees C (68 and 77 degrees F). Get rid of any unused medication after the expiration date. To get rid of medications that are no longer needed or have  expired: Take the medication to a medication take-back program. Check with your pharmacy or law enforcement to find a location. If you cannot return the medication, check the label or package insert to see if the medication should be thrown out in the garbage or flushed down the toilet. If you are not sure, ask your care team. If it is safe to put it in the trash, take the medication out of the container. Mix the medication with cat litter, dirt, coffee grounds, or other unwanted substance. Seal the mixture in a bag or container. Put it in the trash. NOTE: This sheet is a summary. It may not cover all possible information. If you have questions about this medicine, talk to your doctor, pharmacist, or health care provider.  2022 Elsevier/Gold Standard (2020-10-02 14:18:18)

## 2021-05-28 NOTE — Progress Notes (Signed)
Patient ID: Gabriel [name]., male    DOB: 29-Mar-1971, 50 y.o.   MRN: [id number]  HPI  [name] is a 50 y/o male with a history of HTN, diabetes, COPD, CKD, asthma, previous tobacco use and chronic heart failure.   Echo report from 05/21/21 reviewed and showed an EF of 40-45% along with mild LVH and mild MR. Echo report from 06/08/20 reviewed and showed an EF of 35-40% along with mildly elevated PA pressure of 44.7 mmHg, moderate LAE and mild/moderate MR. Echo report from 11/23/19 reviewed and showed an EF of 25-30% with mild LVH and moderately elevated PA Pressure. Echo report from 06/26/2019 reviewed and showed an EF of 30-35% along with moderately elevated PA pressure. Echo done 04/29/17 showed EF 45-50%. Echo report that was done 11/28/16 and it showed an EF of 40-45% along with mild MR. EF has improved from 30-35% July 2017.  RHC/LHC done 06/13/20 and showed: Severe single-vessel coronary artery disease with chronic total occlusion of the proximal RCA.  The distal vessel fills via left to right collaterals. Mild to moderate, nonobstructive CAD involving the LAD and LCx with up to 50% stenosis. Mildly elevated left and right heart filling pressures. Moderate pulmonary hypertension (mean PAP 40 mmHg) with significantly elevated pulmonary vascular resistance (PVR 7.4 WU). Severely reduced Fick cardiac output/index.  Admitted 05/20/21 due to chest pain and shortness of breath. BNP elevated so IV lasix given. Cardiology consult obtained. Entresto and IV lasix had to be held due to rising creatinine. Chronically elevated troponins. Discharged after 3 days.   He presents today for a follow-up visit with a chief complaint of intermittent chest pain. He says that this is chronic in nature but occurs on an intermittent basis. Nothing severe and "nothing like my heart attack". He has associated cough and slight weight gain of 1 pound over the last week. He denies any difficulty sleeping, dizziness,  abdominal distention, palpitations, pedal edema, shortness of breath, wheezing or fatigue.   Recently started on Jardiance and is having lab work done next week. Says that he's being referred back to the [facility] for consideration of cardiomems.   Past Medical History:  Diagnosis Date   CAD (coronary artery disease)    a. 04/2015 low risk MV;  b. 12/2016 Cath: minor irregs in LAD/Diag/LCX/OM, RCA 40p/m/d; c. 05/2020 Cath: LM nl, LAD 50d, LCX 30p, OM1 40, RCA 100p w/ L->L collats. CO/CI 3.1/1.3-->Med rx.   Chest wall pain, chronic    CHF (congestive heart failure) (HCC)    Chronic Troponin Elevation    CKD (chronic kidney disease), stage II-III    COPD (chronic obstructive pulmonary disease) (HCC)    Diabetes mellitus without complication (HCC)    HFrEF (heart failure with reduced ejection fraction) ([place])    Hypertension    Mitral regurgitation    Mild to moderate by October 2021 echocardiogram.   Mixed Ischemic & NICM (nonischemic cardiomyopathy) ([place])    a. 03/2015 Echo: EF 45-50%; b. 12/2015 Echo: EF 20-25%; c. 02/2016 Echo: EF 30-35%; d. 11/2016 Echo: EF 40-45%; e. 06/2019 Echo: EF 30-35%; f. 11/2019 Echo: EF 25-30%; g. 05/2020 Echo: EF 35-40%, glob HK, nl RV size/fxn, PASP 44.35mmHg, mod dil LA. Mild to mod MR.   Myocardial infarct (HCC)    NSVT (nonsustained ventricular tachycardia)    a. 12/2015 noted on tele-->amio;  b. 12/2015 Event monitor: no VT noted.   Obesity (BMI 30.0-34.9)    Psoriasis    Recurrent pulmonary emboli ([place])  06/07/2020   06/07/20: small bilateral PEs.  12/31/19: RUL and RLL PEs.   Syncope    a. 01/2016 - felt to be vasovagal.   Past Surgical History:  Procedure Laterality Date   AMPUTATION     CARDIAC CATHETERIZATION     FINGER AMPUTATION     Traumatic   FINGER FRACTURE SURGERY Left    LEFT HEART CATH AND CORONARY ANGIOGRAPHY N/A 01/06/2017   Procedure: Left Heart Cath and Coronary Angiography;  Surgeon: Wellington Hampshire, MD;  Location: [facility];   Service: Cardiovascular;  Laterality: N/A;   RIGHT/LEFT HEART CATH AND CORONARY ANGIOGRAPHY N/A 06/13/2020   Procedure: RIGHT/LEFT HEART CATH AND CORONARY ANGIOGRAPHY;  Surgeon: Nelva Bush, MD;  Location: [facility];  Service: Cardiovascular;  Laterality: N/A;   Family History  Problem Relation Age of Onset   Diabetes Mother    Diabetes Mellitus II Mother    Hypothyroidism Mother    Hypertension Mother    Kidney failure Mother        Dialysis   Heart attack Mother        74 yo approximately   Hypertension Father    Gout Father    Cancer Maternal Grandfather    Diabetes Maternal Grandfather    Cancer Paternal Aunt    Social History   Tobacco Use   Smoking status: Former    Packs/day: 0.50    Years: 33.00    Pack years: 16.50    Types: Cigarettes    Quit date: 05/08/2020    Years since quitting: 1.0   Smokeless tobacco: Former    Quit date: 05/08/2020   Tobacco comments:    Quit Sept 2021  Substance Use Topics   Alcohol use: Yes    Alcohol/week: 0.0 standard drinks    Comment: occassionally   Allergies  Allergen Reactions   Metformin And Related Nausea And Vomiting   Prednisone Other (See Comments)    Reaction: Hallucinations     Zantac [Ranitidine Hcl] Diarrhea and Nausea Only    Night sweats   Prior to Admission medications   Medication Sig Start Date End Date Taking? Authorizing Provider  carvedilol (COREG) 6.25 MG tablet Take 1 tablet (6.25 mg total) by mouth 2 (two) times daily with a meal. 08/02/20  Yes Larey Dresser, MD  cetirizine (ZYRTEC) 10 MG tablet Take 10 mg by mouth daily.   Yes [provider]  Dulaglutide (TRULICITY) 8.54 OE/7.0JJ SOPN Inject 0.75 mg into the skin once a week. 05/23/21 07/22/21 Yes Jennye Boroughs, MD  empagliflozin (JARDIANCE) 10 MG TABS tablet Take 1 tablet (10 mg total) by mouth daily before breakfast. 05/28/21  Yes Furth, Cadence H, PA-C  ezetimibe (ZETIA) 10 MG tablet Take 1 tablet (10 mg total) by mouth  daily. 04/04/21  Yes Minna Merritts, MD  fenofibrate (TRICOR) 145 MG tablet Take 1 tablet (145 mg total) by mouth daily. 06/09/20  Yes Lorella Nimrod, MD  Insulin Glargine (BASAGLAR KWIKPEN) 100 UNIT/ML Inject 28 Units into the skin at bedtime. 05/23/21  Yes Jennye Boroughs, MD  insulin lispro (HUMALOG) 100 UNIT/ML KwikPen Inject 3 Units into the skin 3 (three) times daily.   Yes [provider]  Ipratropium-Albuterol (COMBIVENT RESPIMAT) 20-100 MCG/ACT AERS respimat Inhale 1 puff into the lungs every 6 (six) hours as needed for wheezing.   Yes [provider]  metolazone (ZAROXOLYN) 5 MG tablet TAKE 1 TABLET (5 MG TOTAL) BY MOUTH DAILY AS NEEDED (FOR WEIGHT OVER 240).  05/23/21  Yes Jennye Boroughs, MD  nitroGLYCERIN (NITROSTAT) 0.4 MG SL tablet Place 1 tablet (0.4 mg total) under the tongue every 5 (five) minutes x 3 doses as needed for chest pain. 06/09/20  Yes Lorella Nimrod, MD  potassium chloride SA (KLOR-CON M20) 20 MEQ tablet TAKE 1 TABLET (20 MEQ) ONCE A DAY ONLY WHEN TAKING TORSEMIDE 40 MG TWICE DAILY 05/03/21  Yes Gollan, Kathlene November, MD  rivaroxaban (XARELTO) 20 MG TABS tablet Take 1 tablet (20 mg total) by mouth daily with supper. 11/07/20  Yes Cammie Sickle, MD  rosuvastatin (CRESTOR) 20 MG tablet Take 1 tablet (20 mg total) by mouth at bedtime. 06/09/20  Yes Lorella Nimrod, MD  sacubitril-valsartan (ENTRESTO) 49-51 MG Take 0.5 tablets by mouth 2 (two) times daily.   Yes [provider]  torsemide (DEMADEX) 20 MG tablet Take 2 tablets (40 mg total) by mouth daily. Take an extra 2 tablets (40MG ) as needed for shortness of breath and/or for weight 242 lb or greater. 04/09/21 07/08/21 Yes Minna Merritts, MD    Review of Systems  Constitutional:  Negative for appetite change and fatigue.  HENT:  Positive for postnasal drip. Negative for congestion and sore throat.   Eyes: Negative.   Respiratory:  Positive for cough. Negative for chest tightness, shortness of  breath and wheezing.   Cardiovascular:  Positive for chest pain (intermittent). Negative for palpitations and leg swelling.  Gastrointestinal:  Negative for abdominal distention and abdominal pain.  Endocrine: Negative.   Genitourinary: Negative.   Skin: Negative.   Allergic/Immunologic: Negative.   Neurological:  Negative for dizziness and light-headedness.  Hematological:  Negative for adenopathy. Does not bruise/bleed easily.  Psychiatric/Behavioral:  Negative for dysphoric mood, sleep disturbance (sleeping on 1 pillow) and suicidal ideas. The patient is not nervous/anxious.    Vitals:   05/29/21 0841  BP: 114/68  Pulse: 82  Resp: 16  SpO2: 98%  Weight: 241 lb 8 oz (109.5 kg)  Height: 6' (1.829 m)   Wt Readings from Last 3 Encounters:  05/29/21 241 lb 8 oz (109.5 kg)  05/28/21 240 lb 8 oz (109.1 kg)  05/23/21 236 lb 5.3 oz (107.2 kg)   Lab Results  Component Value Date   CREATININE 1.04 05/23/2021   CREATININE 1.37 (H) 05/22/2021   CREATININE 1.08 05/21/2021    Physical Exam Vitals and nursing note reviewed.  Constitutional:      Appearance: He is well-developed.  HENT:     Head: Normocephalic and atraumatic.  Neck:     Vascular: No JVD.  Cardiovascular:     Rate and Rhythm: Normal rate and regular rhythm.  Pulmonary:     Effort: Pulmonary effort is normal.     Breath sounds: No wheezing or rales.  Chest:     Chest wall: No tenderness.  Abdominal:     General: There is no distension.     Palpations: Abdomen is soft.     Tenderness: There is no abdominal tenderness.  Musculoskeletal:        General: No tenderness.     Cervical back: Normal range of motion and neck supple.     Right lower leg: No edema.     Left lower leg: No edema.  Skin:    General: Skin is warm and dry.  Neurological:     Mental Status: He is alert and oriented to person, place, and time.  Psychiatric:        Behavior: Behavior normal.  Thought Content: Thought content normal.    Assessment & Plan:  1: Chronic heart failure with reduced ejection fraction- - NYHA class I - euvolemic today  - weighing daily and reminded to call for an overnight weight gain of >2 pounds or a weekly weight gain of >5 pounds; weight at home has been rising.  - weight up 2 pounds since last visit here 1 year ago - has order in place to take metolazone if his weight is >240 pounds; planning on taking it tomorrow as his home weight is 241 today - not adding salt and he was encouraged to closely follow a 2000mg  sodium diet - saw cardiology Rockey Situ) 04/04/21; returns 06/26/21 - on GDMT of jardiance, carvedilol & entresto - having labs done next week; consider adding spironolactone - 3 weeks samples of jardiance 10mg  provided - saw EP Caryl Comes) 09/02/19; discussion about ICD  - saw provider at [facility] 07/06/20; being referred back to them for consideration of cardiomems - BNP from 05/20/21 was 2525.3  2: HTN- - BP looks good (114/68) - saw PCP @ [facility] 04/13/20; has medicaid now  - BMP from 05/23/21 reviewed and showed sodium 136, potassium 3.9, creatinine 1.04 and GFR >60  3: Diabetes-   -  A1c 8.1% on 03/28/21 - glucose today was 130 at home - completed some classes at the [facility]   Patient did not bring his medications nor a list. Each medication was verbally reviewed with the patient and he was encouraged to bring the bottles to every visit to confirm accuracy of list.    Return in 2 months or sooner for any questions/problems before then. If he has other cardiology appointments around that time, he was instructed to call our office and we could push ours back.

## 2021-05-29 ENCOUNTER — Ambulatory Visit: Payer: Medicaid Other | Attending: Family | Admitting: Family

## 2021-05-29 ENCOUNTER — Other Ambulatory Visit: Payer: Self-pay | Admitting: Family

## 2021-05-29 ENCOUNTER — Encounter: Payer: Self-pay | Admitting: Family

## 2021-05-29 VITALS — BP 114/68 | HR 82 | Resp 16 | Ht 72.0 in | Wt 241.5 lb

## 2021-05-29 DIAGNOSIS — E1122 Type 2 diabetes mellitus with diabetic chronic kidney disease: Secondary | ICD-10-CM | POA: Diagnosis not present

## 2021-05-29 DIAGNOSIS — I10 Essential (primary) hypertension: Secondary | ICD-10-CM

## 2021-05-29 DIAGNOSIS — Z833 Family history of diabetes mellitus: Secondary | ICD-10-CM | POA: Diagnosis not present

## 2021-05-29 DIAGNOSIS — Z79899 Other long term (current) drug therapy: Secondary | ICD-10-CM | POA: Insufficient documentation

## 2021-05-29 DIAGNOSIS — N189 Chronic kidney disease, unspecified: Secondary | ICD-10-CM | POA: Diagnosis not present

## 2021-05-29 DIAGNOSIS — I5022 Chronic systolic (congestive) heart failure: Secondary | ICD-10-CM | POA: Diagnosis present

## 2021-05-29 DIAGNOSIS — E119 Type 2 diabetes mellitus without complications: Secondary | ICD-10-CM

## 2021-05-29 DIAGNOSIS — Z888 Allergy status to other drugs, medicaments and biological substances status: Secondary | ICD-10-CM | POA: Diagnosis not present

## 2021-05-29 DIAGNOSIS — Z794 Long term (current) use of insulin: Secondary | ICD-10-CM

## 2021-05-29 DIAGNOSIS — J449 Chronic obstructive pulmonary disease, unspecified: Secondary | ICD-10-CM | POA: Insufficient documentation

## 2021-05-29 DIAGNOSIS — Z8249 Family history of ischemic heart disease and other diseases of the circulatory system: Secondary | ICD-10-CM | POA: Diagnosis not present

## 2021-05-29 DIAGNOSIS — Z7901 Long term (current) use of anticoagulants: Secondary | ICD-10-CM | POA: Diagnosis not present

## 2021-05-29 DIAGNOSIS — I130 Hypertensive heart and chronic kidney disease with heart failure and stage 1 through stage 4 chronic kidney disease, or unspecified chronic kidney disease: Secondary | ICD-10-CM | POA: Insufficient documentation

## 2021-05-29 DIAGNOSIS — Z87891 Personal history of nicotine dependence: Secondary | ICD-10-CM | POA: Insufficient documentation

## 2021-05-29 NOTE — Patient Instructions (Signed)
Continue weighing daily and call for an overnight weight gain of > 2 pounds or a weekly weight gain of >5 pounds. 

## 2021-06-01 ENCOUNTER — Telehealth: Payer: Self-pay | Admitting: Cardiovascular Disease

## 2021-06-01 NOTE — Telephone Encounter (Signed)
Pt c/o medication issue:  1. Name of Medication: Jardiance  2. How are you currently taking this medication (dosage and times per day)? 1 tablet daily  3. Are you having a reaction (difficulty breathing--STAT)? Sometimes  4. What is your medication issue? Light headed, sick on stomach

## 2021-06-01 NOTE — Telephone Encounter (Signed)
Was able to reach out to Mr. Bennett Scrape after consulting with Cadence Kathlen Mody, PA-C regarding pt recently starting on Jardiance on 10/10 after OV.   Pt reports taking medication is making him feel "sick to stomach" and some lightheadedness when standing. Cadence advised trying for a few more days to see has he does as this can be a normal side affect of medication.  Pt takes the medication first thing in the am and on an empty stomach with his other meds. Advised to try taking after morning breakfast. Pt reports this am BP 90/56. He is also taking his other cardiac meds in the am as well.   Coreg  Jardiacne  Torsemide  Entresto  Metolazone PRN  Advised to try and move Jardiance in the evening time to see if that is better on his stomach and his lightheadedness. Increase hydration as well for low BP. Pt agrees to plan, wants to try until Monday as that when he comes in for labs. If not better on meds, Cadence Jorene Minors is okay for pt to d/c medication.   Otherwise all questions or concerns were address and no additional concerns at this time. Agreeable to plan, will call back for anything further.    F/u visit 11/8 with Cadence Kathlen Mody, PA-C

## 2021-06-03 IMAGING — US US EXTREM LOW VENOUS*R*
1 series · 13 of 24 positions shown · non-contrast
Comparison: None.

CLINICAL DATA: Right knee and thigh pain, swelling

EXAM:
RIGHT LOWER EXTREMITY VENOUS DOPPLER ULTRASOUND
TECHNIQUE: Gray-scale sonography with compression, as well as color and duplex
ultrasound, were performed to evaluate the deep venous system(s)
from the level of the common femoral vein through the popliteal and
proximal calf veins.

[Series 1: us venous img lower uni right (dvt) · portal-venous · 13 of 37 slices shown]
[im 1/37]
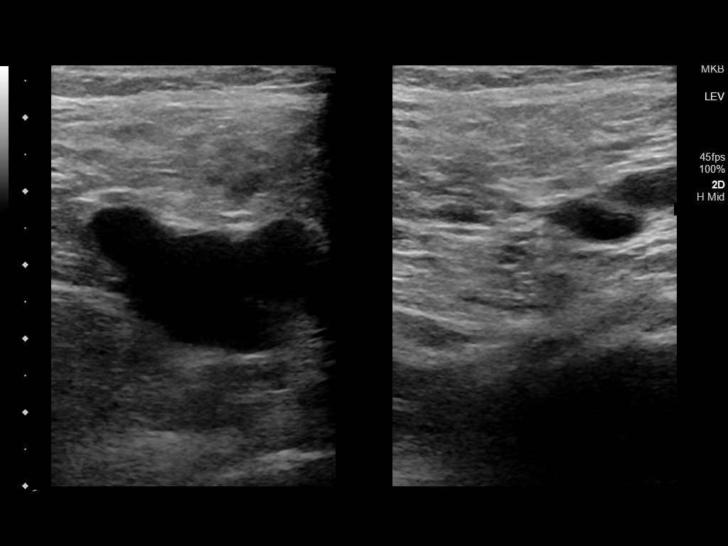
[im 4/37]
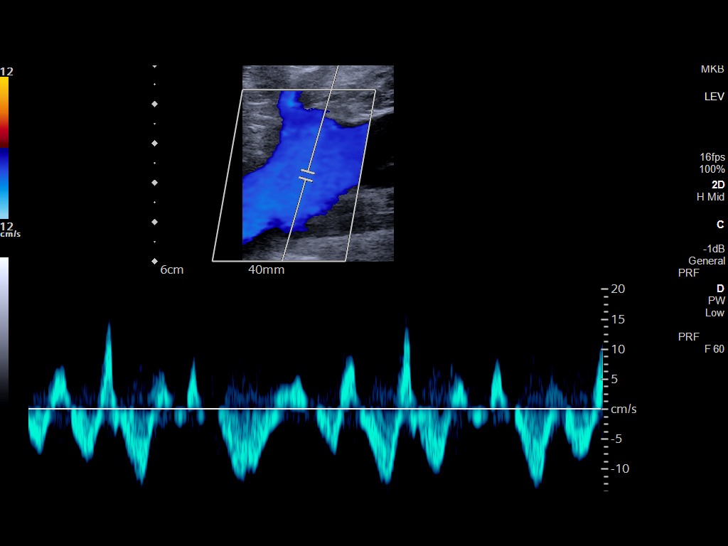
[im 7/37]
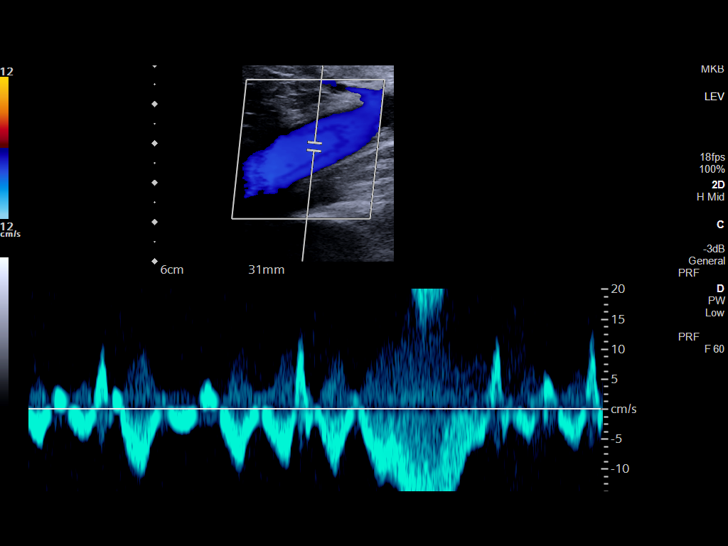
[im 10/37]
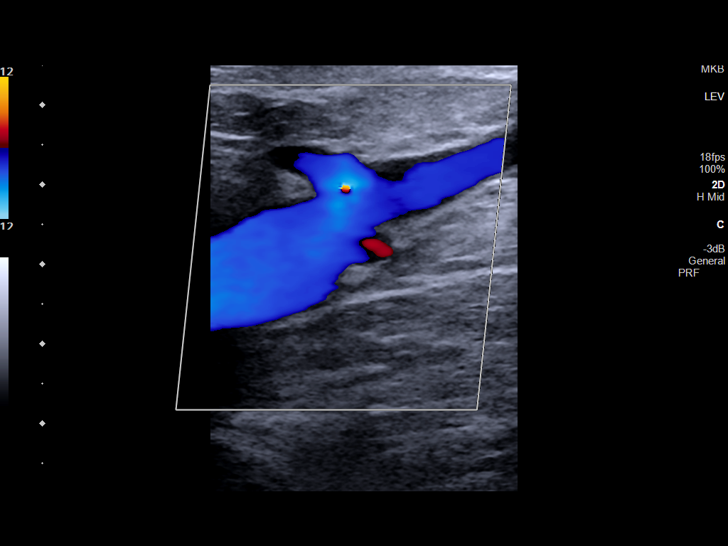
[im 13/37]
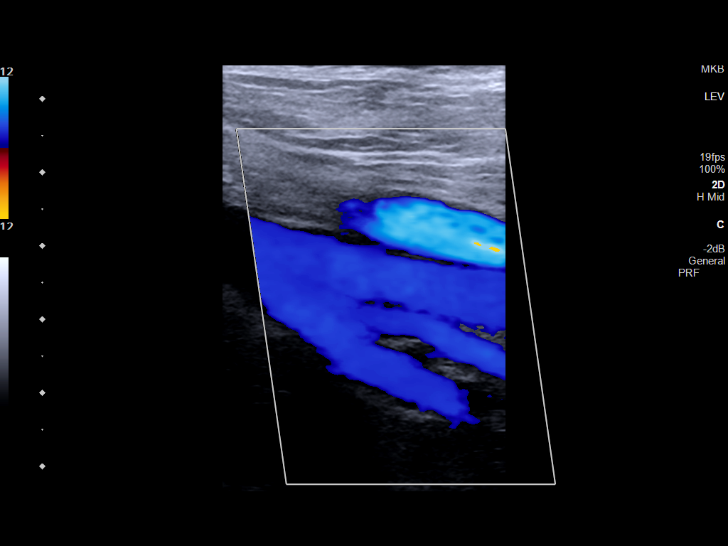
[im 16/37]
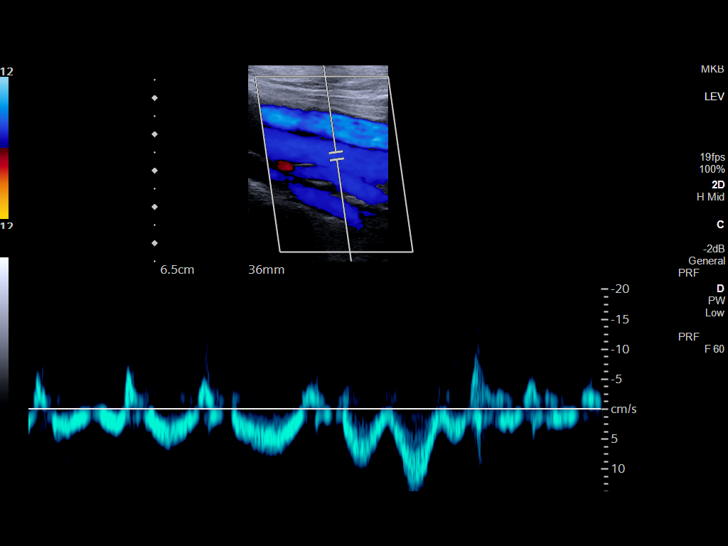
[im 19/37]
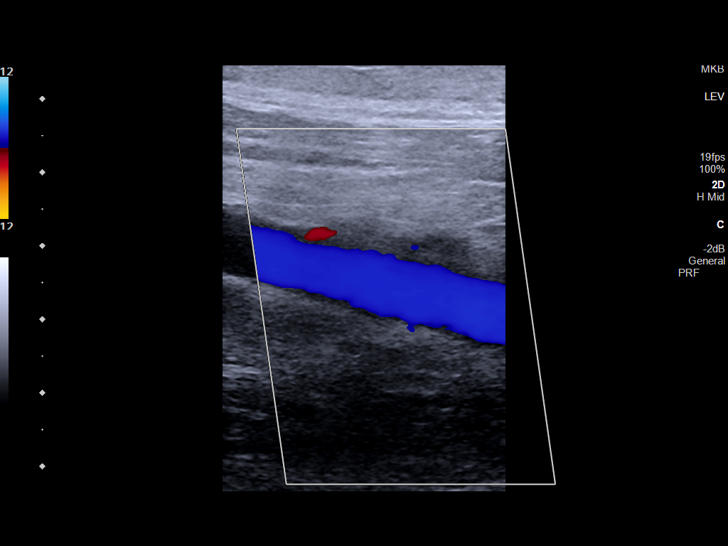
[im 21/37]
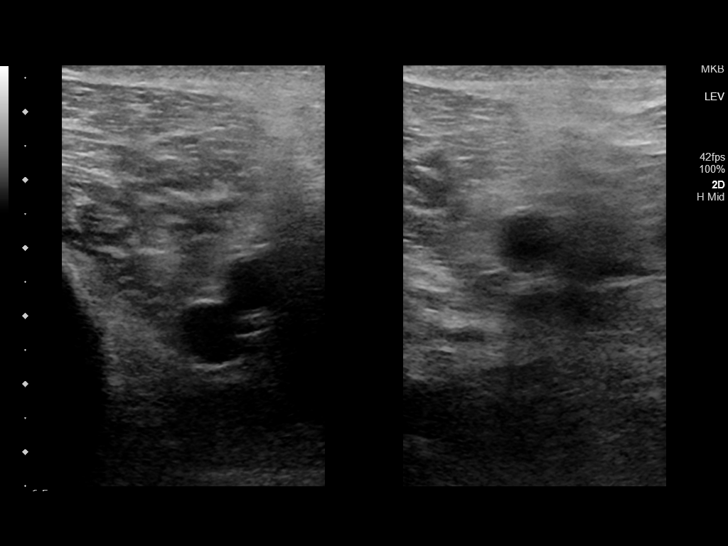
[im 24/37]
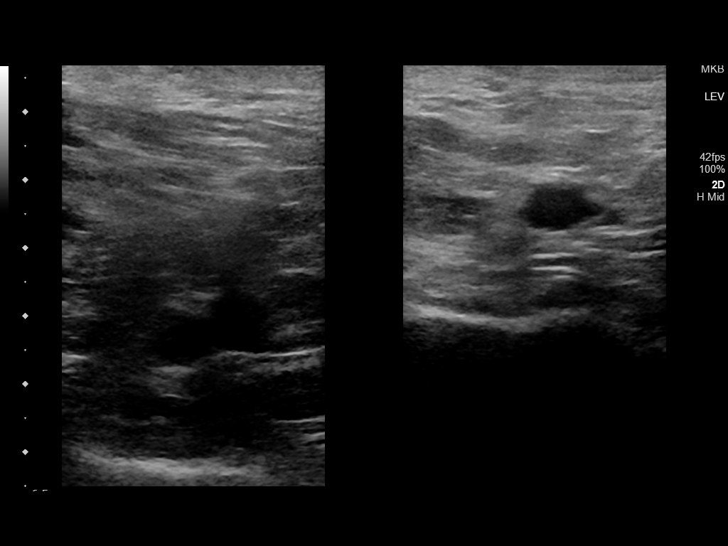
[im 27/37]
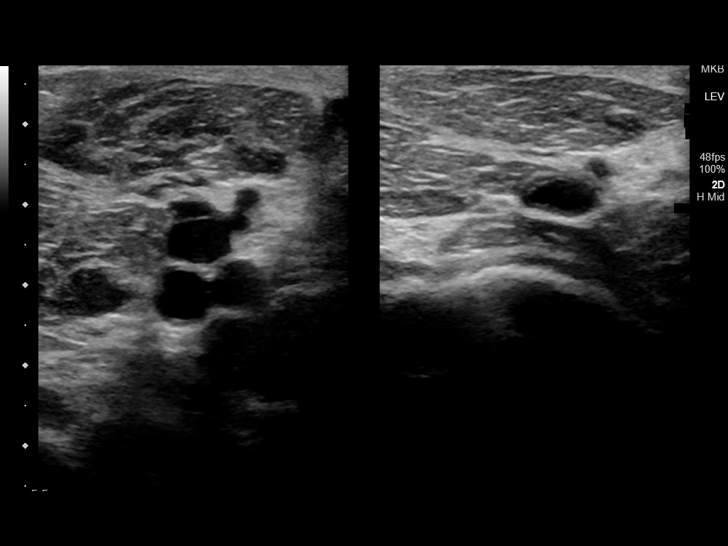
[im 30/37]
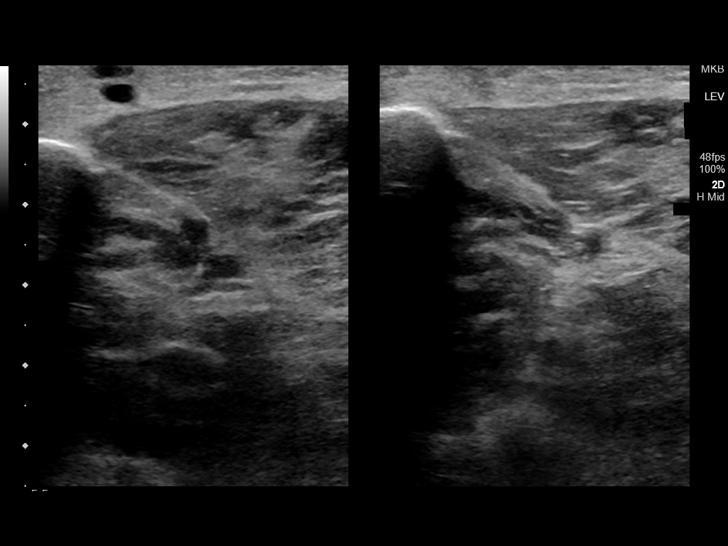
[im 33/37]
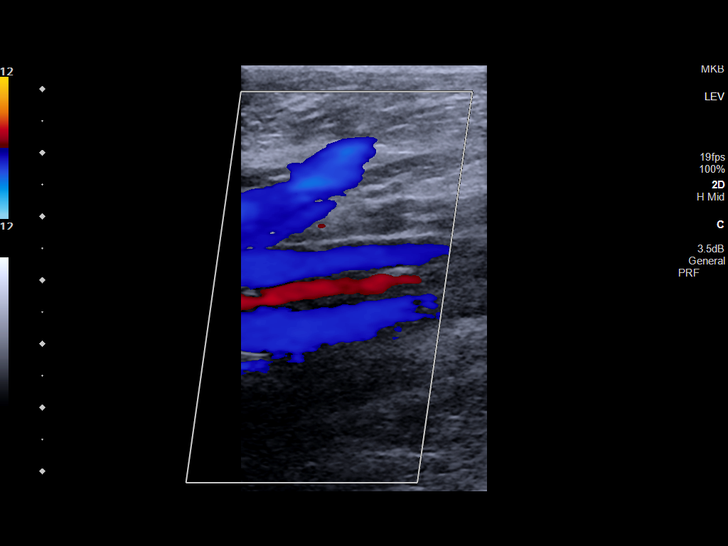
[im 37/37]
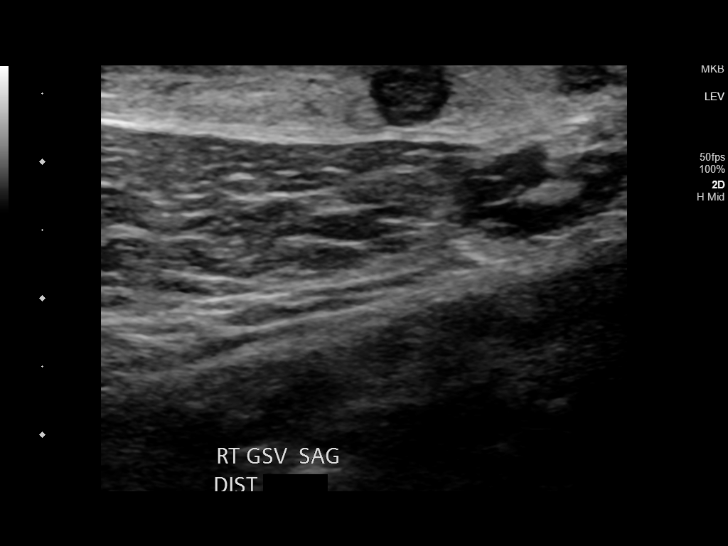

[13 of 24 positions shown; findings below may reference images not displayed]

FINDINGS: VENOUS

Normal compressibility of the common femoral, superficial femoral,
and popliteal veins, as well as the visualized calf veins.
Visualized portions of profunda femoral vein and great saphenous
vein unremarkable. No filling defects to suggest DVT on grayscale or
color Doppler imaging. Doppler waveforms show normal direction of
venous flow, normal respiratory phasicity and response to
augmentation.

Limited views of the contralateral common femoral vein are
unremarkable.

OTHER

None.

Limitations: Note is made of occlusive thrombus seen within the
right greater saphenous vein in the mid to distal thigh. This does
not extend into the deep venous system.
IMPRESSION: 1. No evidence of deep venous thrombosis within the right lower
extremity.
2. Superficial thrombus within the mid to distal right greater
saphenous vein.

## 2021-06-03 IMAGING — CR DG KNEE COMPLETE 4+V*R*
4 series · 4 of 4 positions shown · non-contrast
Comparison: None.

CLINICAL DATA: 48-year-old male with right knee pain. No known
injury.

EXAM:
RIGHT KNEE - COMPLETE 4+ VIEW

[knee ap]
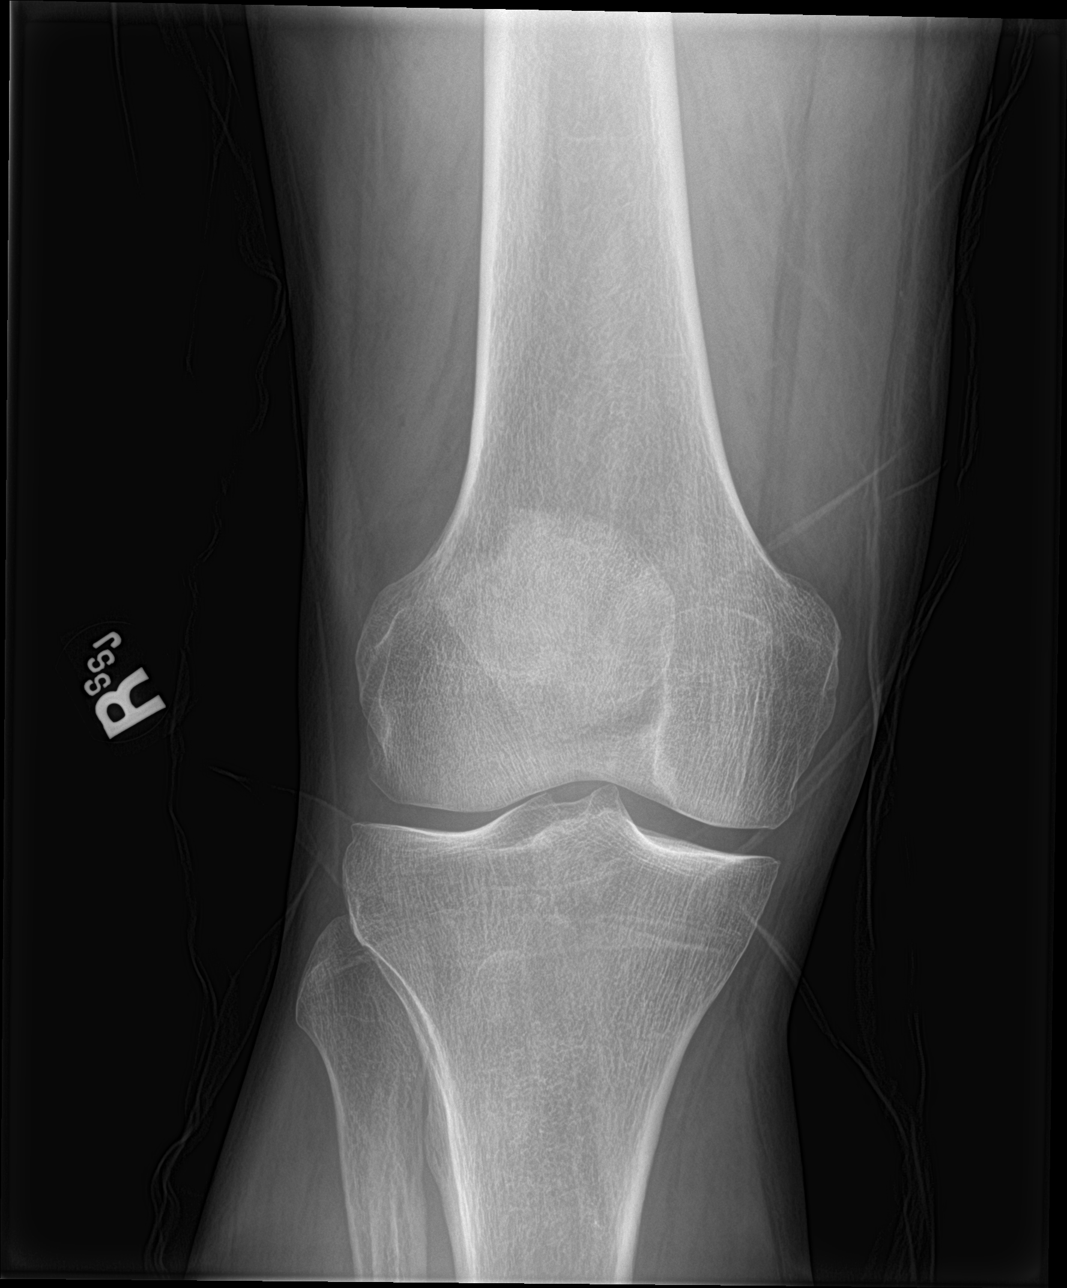

[knee obl (1 of 2)]
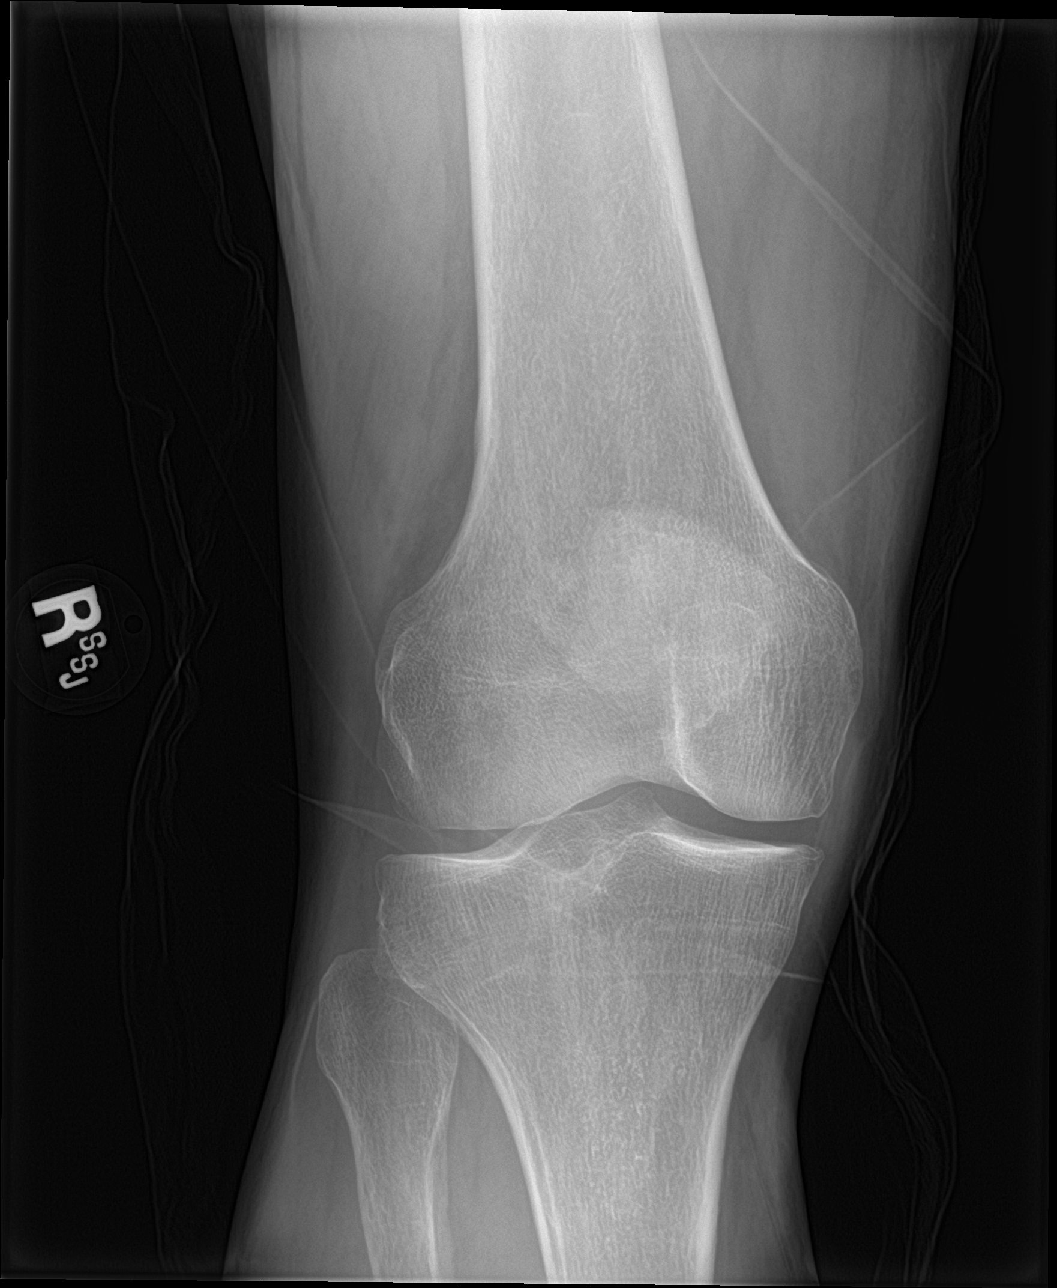

[knee obl (2 of 2)]
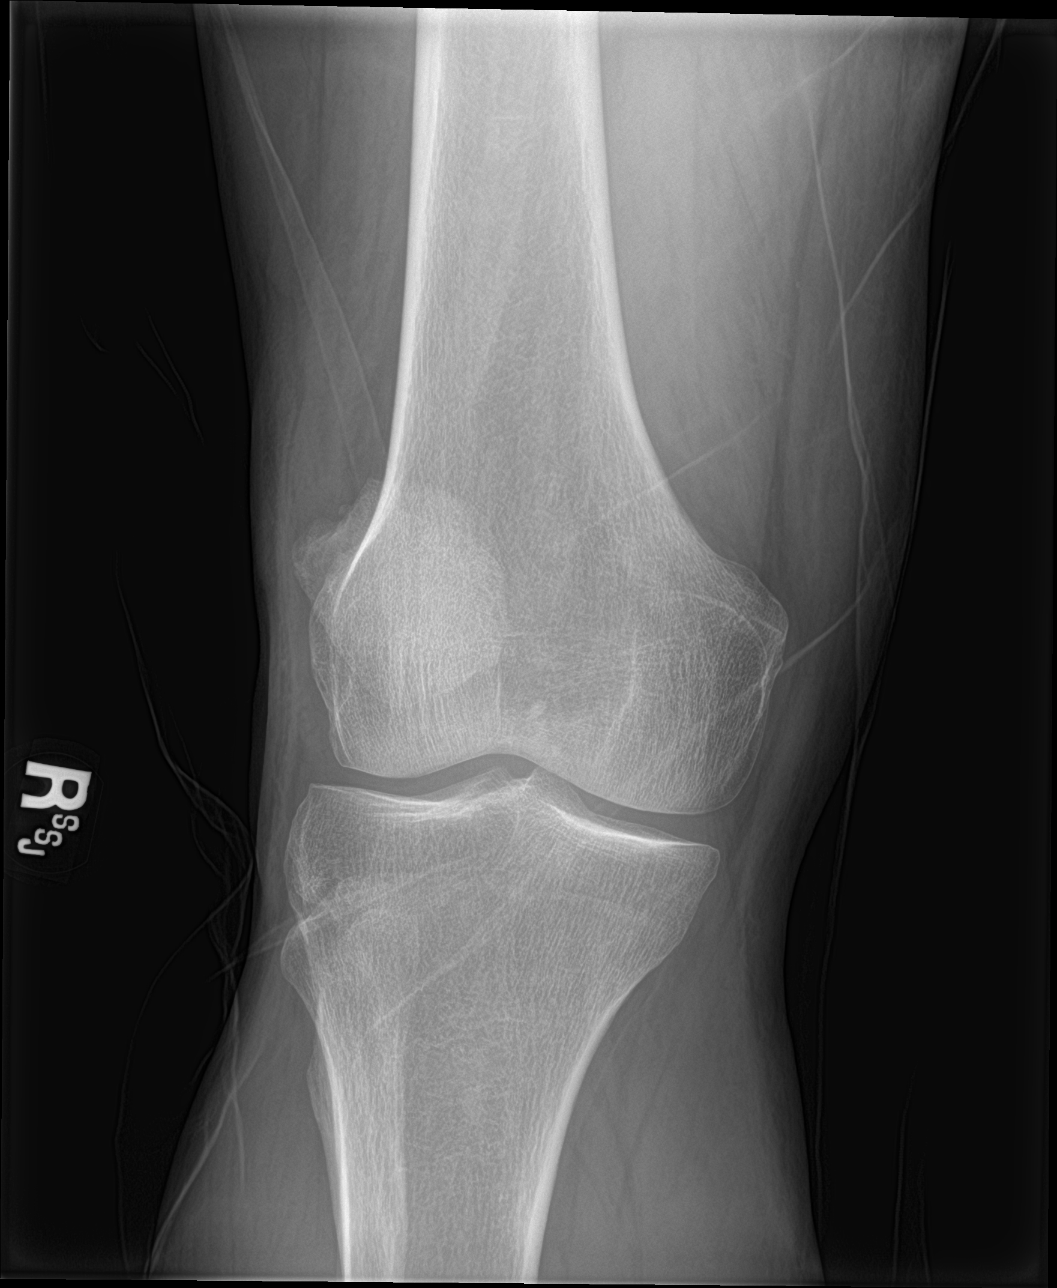

[knee lat]
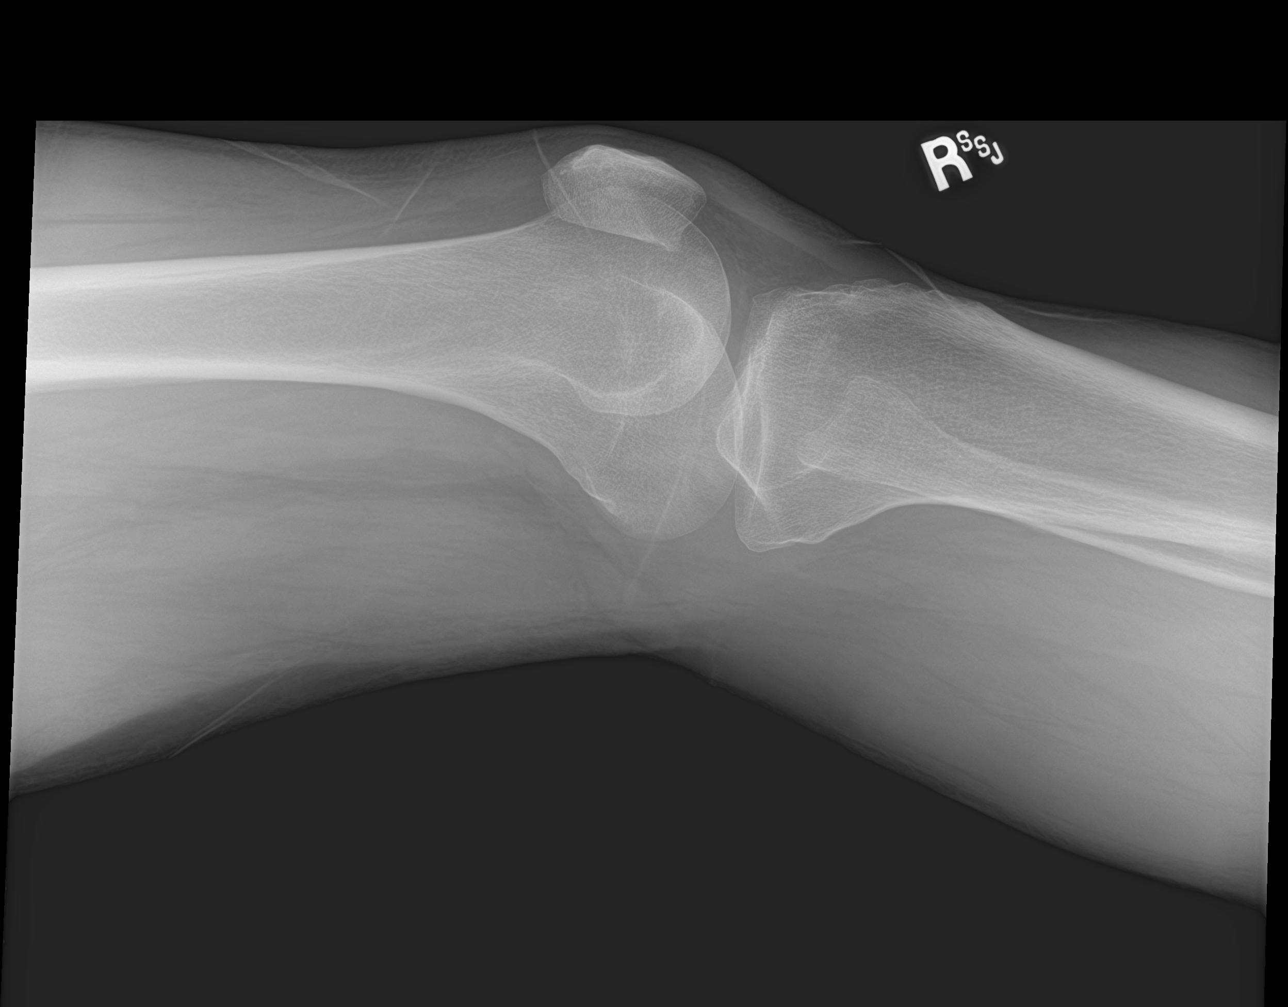

[4 of 4 positions shown; findings below may reference images not displayed]

FINDINGS: There is no acute fracture or dislocation. Irregular appearance of
the superolateral patella, chronic. The bones are well mineralized.
No arthritic changes. No joint effusion. The soft tissues are
unremarkable.
IMPRESSION: No acute fracture or dislocation.

## 2021-06-05 ENCOUNTER — Other Ambulatory Visit (INDEPENDENT_AMBULATORY_CARE_PROVIDER_SITE_OTHER): Payer: Medicaid Other

## 2021-06-05 ENCOUNTER — Other Ambulatory Visit: Payer: Self-pay

## 2021-06-05 DIAGNOSIS — I5022 Chronic systolic (congestive) heart failure: Secondary | ICD-10-CM | POA: Diagnosis not present

## 2021-06-05 DIAGNOSIS — I428 Other cardiomyopathies: Secondary | ICD-10-CM

## 2021-06-05 DIAGNOSIS — Z79899 Other long term (current) drug therapy: Secondary | ICD-10-CM

## 2021-06-06 LAB — BASIC METABOLIC PANEL
BUN/Creatinine Ratio: 26 — ABNORMAL HIGH (ref 9–20)
BUN: 39 mg/dL — ABNORMAL HIGH (ref 6–24)
CO2: 24 mmol/L (ref 20–29)
Calcium: 9.9 mg/dL (ref 8.7–10.2)
Chloride: 96 mmol/L (ref 96–106)
Creatinine, Ser: 1.51 mg/dL — ABNORMAL HIGH (ref 0.76–1.27)
Glucose: 239 mg/dL — ABNORMAL HIGH (ref 70–99)
Potassium: 4 mmol/L (ref 3.5–5.2)
Sodium: 142 mmol/L (ref 134–144)
eGFR: 55 mL/min/{1.73_m2} — ABNORMAL LOW (ref >59)

## 2021-06-07 ENCOUNTER — Telehealth: Payer: Self-pay | Admitting: Medical

## 2021-06-07 DIAGNOSIS — I5022 Chronic systolic (congestive) heart failure: Secondary | ICD-10-CM

## 2021-06-07 NOTE — Telephone Encounter (Signed)
Furth, Cadence H, PA-C  P [facility] Triage Labs showed worsening kidney function. Stop Jardiance and check BMET in a week.

## 2021-06-07 NOTE — Telephone Encounter (Signed)
Patient made aware of lab results and Cadence Furth, [place] recommendation. Patient will stop Jardiance. We do not have a lab tech on 06/15/21. Lab appt scheduled on 06/18/21 @ 8am.

## 2021-06-12 IMAGING — DX DG CHEST 1V PORT
1 series · 1 of 1 positions shown · non-contrast
Comparison: November 22, 2019.

CLINICAL DATA: Shortness of breath

EXAM:
PORTABLE CHEST 1 VIEW

[chest ap]
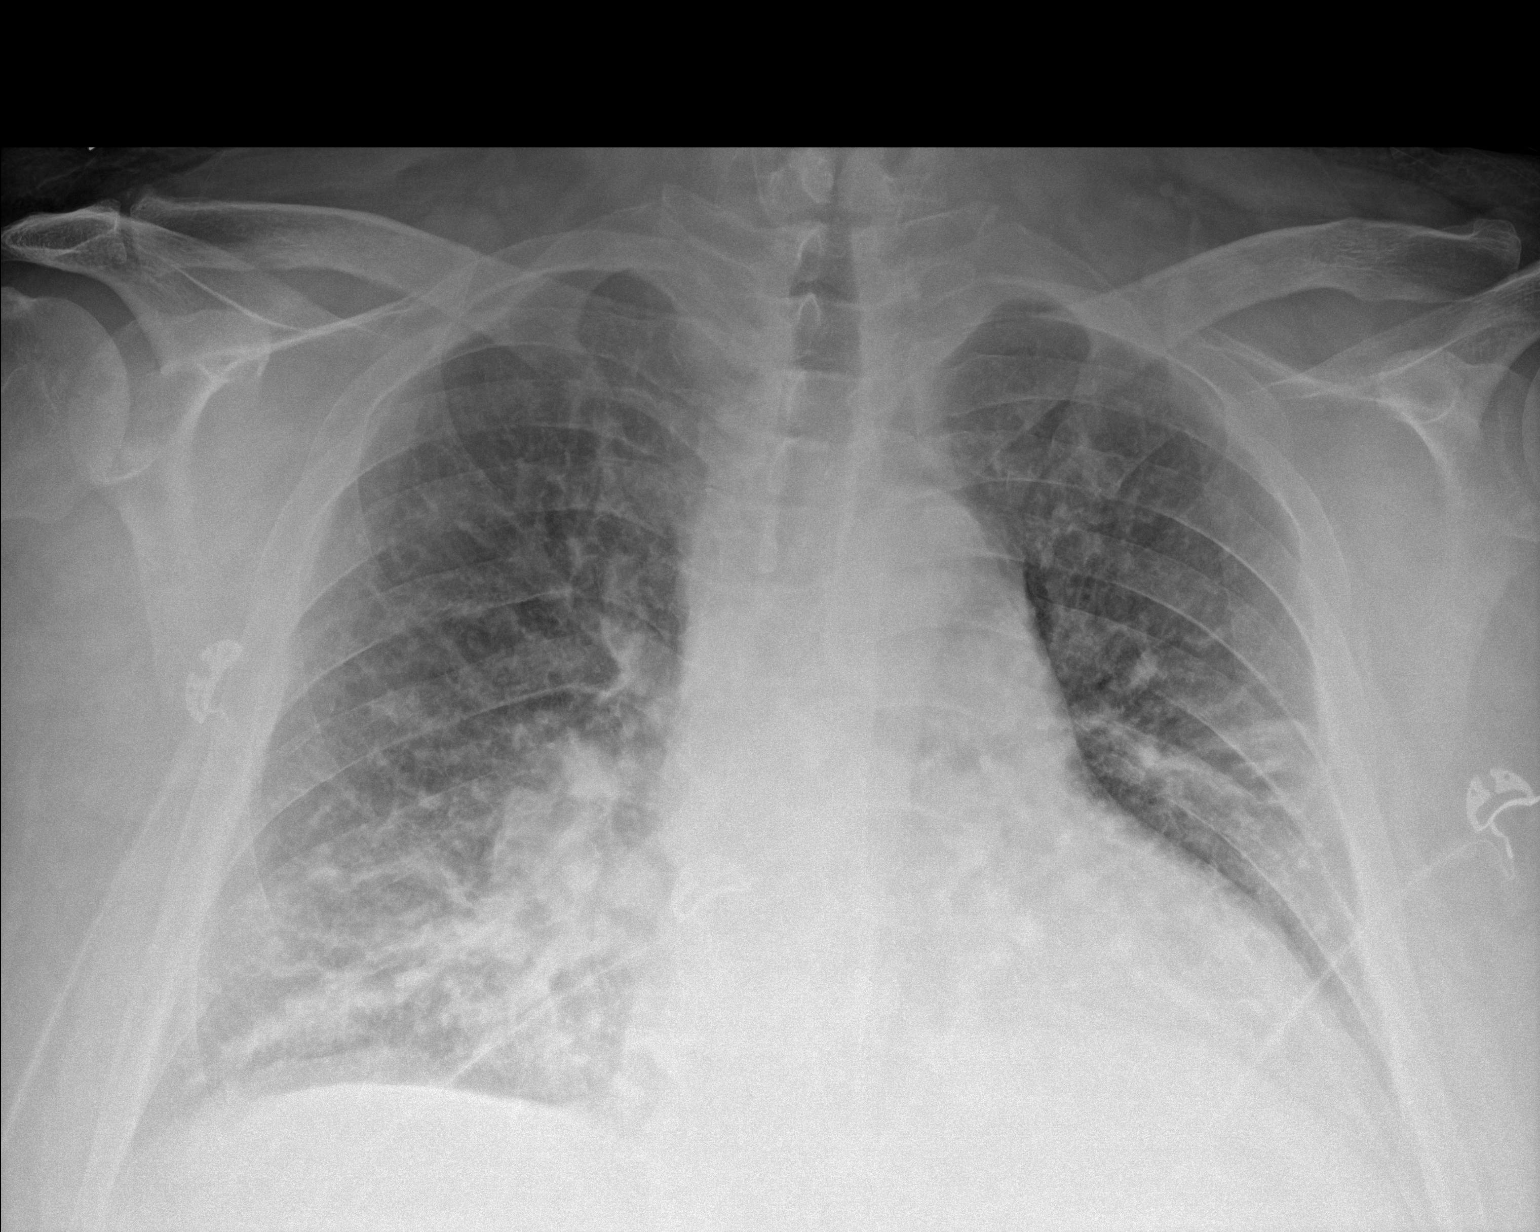

[1 of 1 positions shown; findings below may reference images not displayed]

FINDINGS: There is cardiomegaly with mild pulmonary venous hypertension. There
is atelectatic change in the bases and left mid lung. There is
bilateral lower lung interstitial edema. No airspace consolidation.
No adenopathy. No bone lesions.
IMPRESSION: Cardiomegaly with pulmonary vascular congestion. Lower lung
interstitial edema. Suspect a degree of congestive heart failure.
Areas of atelectatic change without consolidation noted. No
adenopathy appreciable.

## 2021-06-15 IMAGING — US US ABDOMEN COMPLETE
1 series · 13 of 25 positions shown · non-contrast
Comparison: CT 09/30/2017

CLINICAL DATA: RIGHT-sided abdominal pain for 1 day.

EXAM:
ABDOMEN ULTRASOUND COMPLETE

[Series 1: us abdomen complete · 0.25mm/px · 13 of 108 slices shown]
[im 1/108]
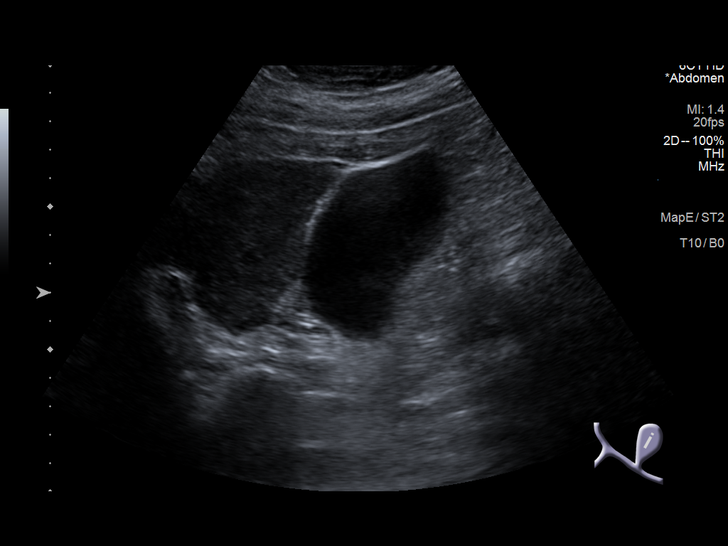
[im 9/108]
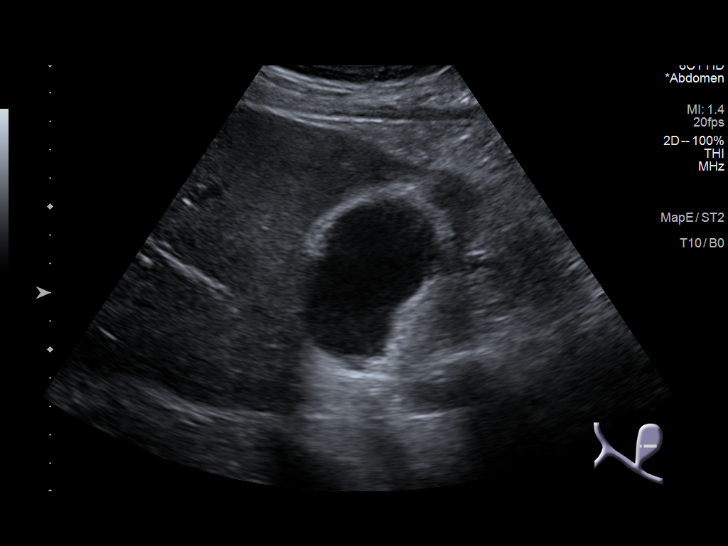
[im 18/108]
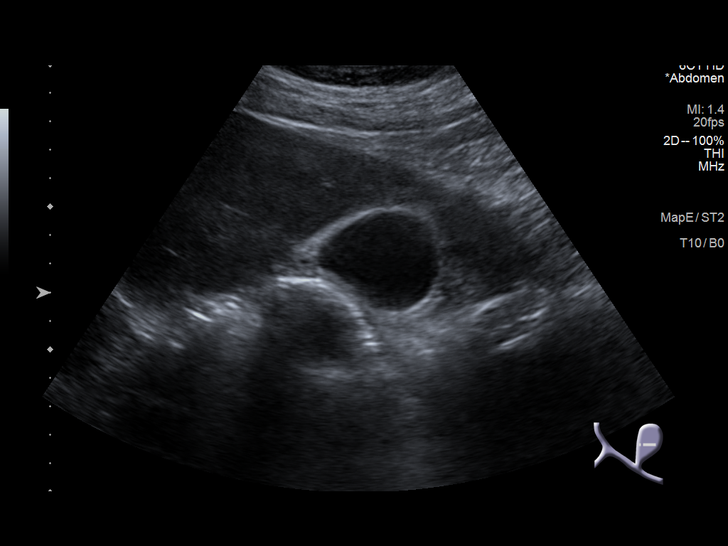
[im 27/108]
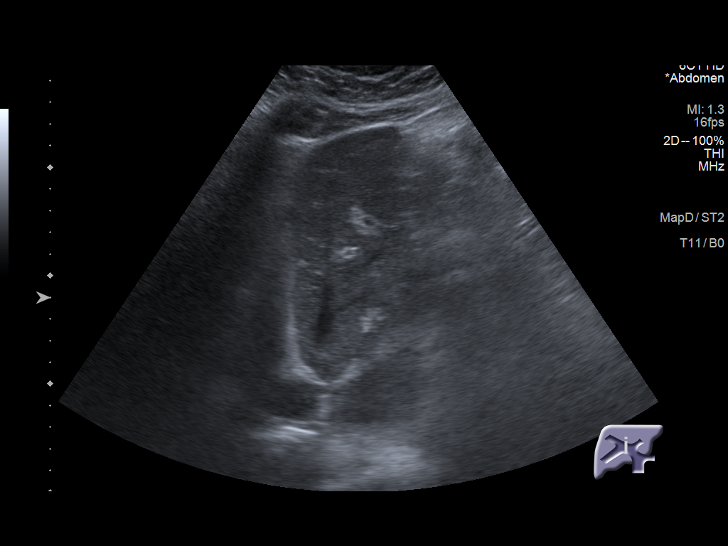
[im 36/108]
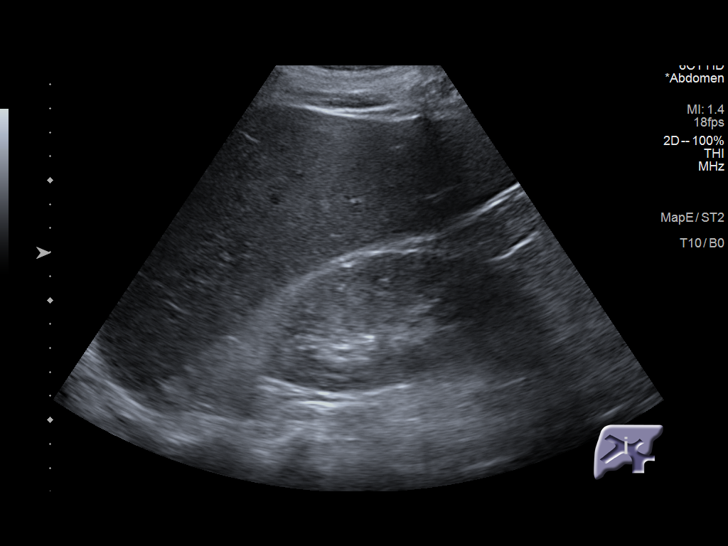
[im 45/108]
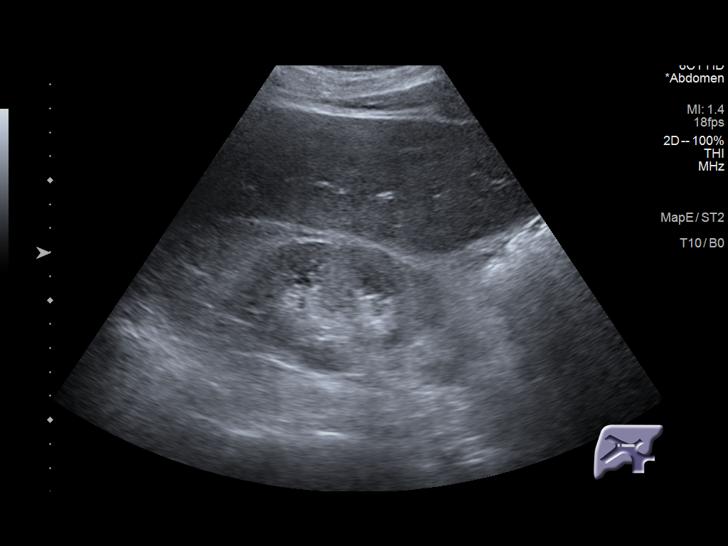
[im 54/108]
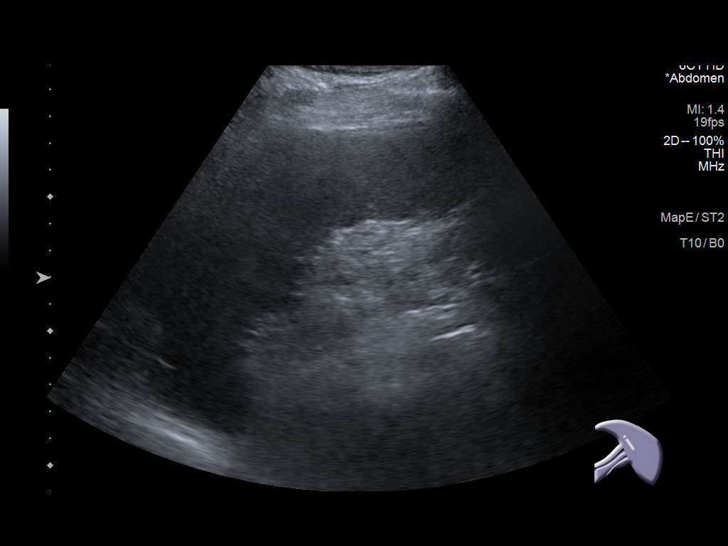
[im 63/108]
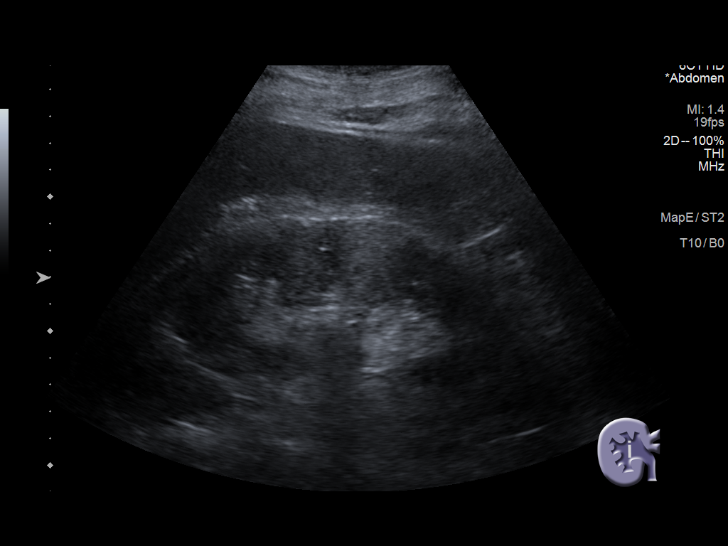
[im 72/108]
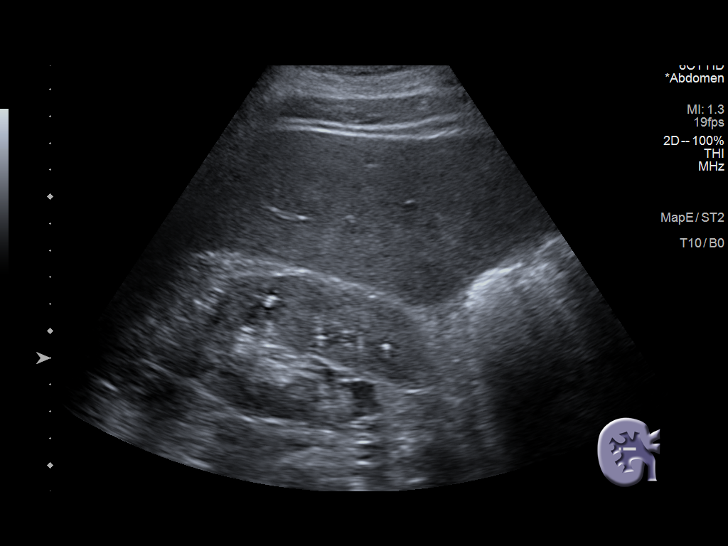
[im 81/108]
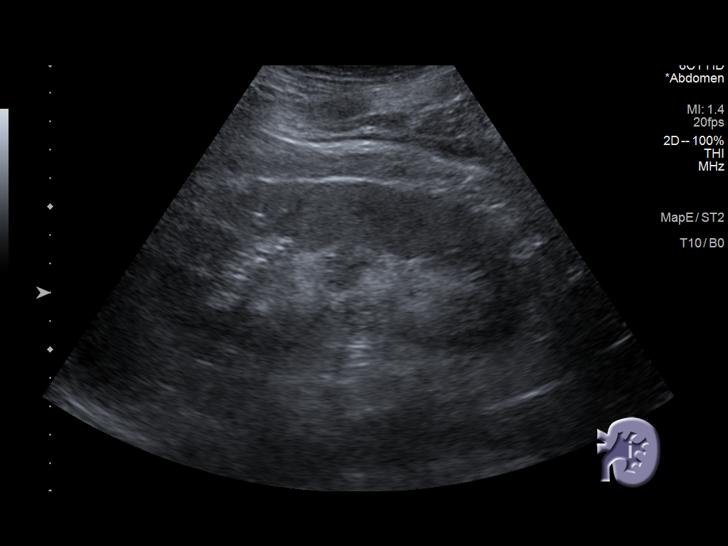
[im 90/108]
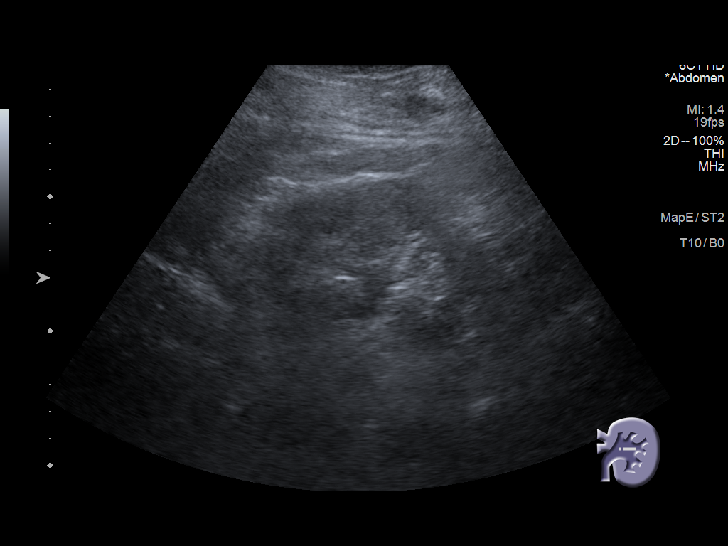
[im 99/108]
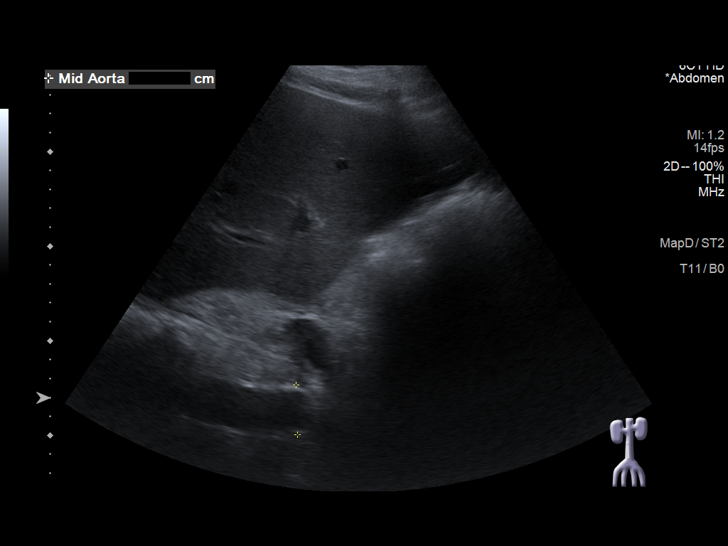
[im 108/108]
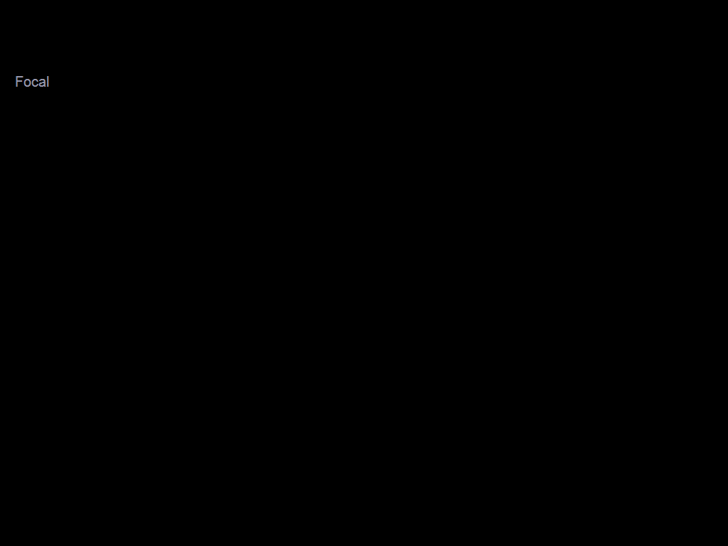

[13 of 25 positions shown; findings below may reference images not displayed]

FINDINGS: Gallbladder: Gallbladder wall is thickened, 3.5 millimeters. This
finding is nonspecific. No stones or pericholecystic fluid. No
sonographic Murphy's sign.

Common bile duct: Diameter: 0.5 millimeters

Liver: Mildly echogenic liver parenchyma without other features of
hepatic steatosis. Rounded liver contour raising the question of
cirrhosis. No focal liver lesions. Portal vein is patent on color
Doppler imaging with normal direction of blood flow towards the
liver.

IVC: No abnormality visualized.

Pancreas: Limited visibility because of overlying bowel gas.

Spleen: Size and appearance within normal limits.

Right Kidney: Length: 12.0 centimeters. Mildly increased
echogenicity. LOWER pole cyst is 1.4 x 1.2 x 1.5 centimeters. No
hydronephrosis.

Left Kidney: Length: 12.9 centimeters. Mildly increased
echogenicity. The central calcifications are favored to be vascular.
No solid or cystic mass.

Abdominal aorta: No aneurysm visualized.

Other findings: Study quality is degraded by patient body habitus
and central bowel gas.
IMPRESSION: 1. Nonspecific gallbladder wall thickening without other evidence
for acute cholecystitis.
2. Rounded liver contour suspicious for cirrhosis.
3. RIGHT renal cyst.
4. No hydronephrosis.

## 2021-06-18 ENCOUNTER — Ambulatory Visit (INDEPENDENT_AMBULATORY_CARE_PROVIDER_SITE_OTHER): Payer: Medicaid Other | Admitting: Medical

## 2021-06-18 ENCOUNTER — Other Ambulatory Visit: Payer: Self-pay

## 2021-06-18 DIAGNOSIS — I5022 Chronic systolic (congestive) heart failure: Secondary | ICD-10-CM | POA: Diagnosis not present

## 2021-06-19 LAB — BASIC METABOLIC PANEL WITH GFR
BUN/Creatinine Ratio: 22 — ABNORMAL HIGH (ref 9–20)
BUN: 28 mg/dL — ABNORMAL HIGH (ref 6–24)
CO2: 26 mmol/L (ref 20–29)
Calcium: 9.1 mg/dL (ref 8.7–10.2)
Chloride: 104 mmol/L (ref 96–106)
Creatinine, Ser: 1.26 mg/dL (ref 0.76–1.27)
Glucose: 206 mg/dL — ABNORMAL HIGH (ref 70–99)
Potassium: 4.1 mmol/L (ref 3.5–5.2)
Sodium: 143 mmol/L (ref 134–144)
eGFR: 69 mL/min/1.73

## 2021-06-23 IMAGING — US US EXTREM LOW VENOUS
1 series · 13 of 24 positions shown · non-contrast
Comparison: None.

CLINICAL DATA: History of pulmonary embolus.  Shortness of breath.



[Series 1: us venous img lower bilat (dvt) · portal-venous · 13 of 73 slices shown]
[im 1/73]
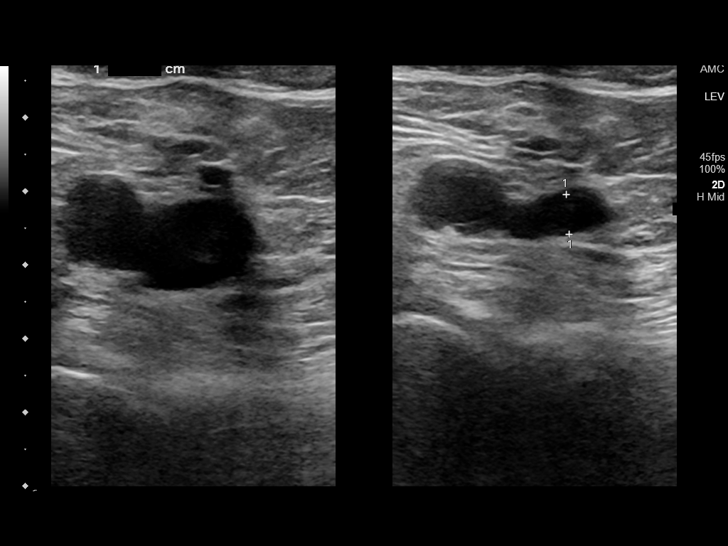
[im 7/73]
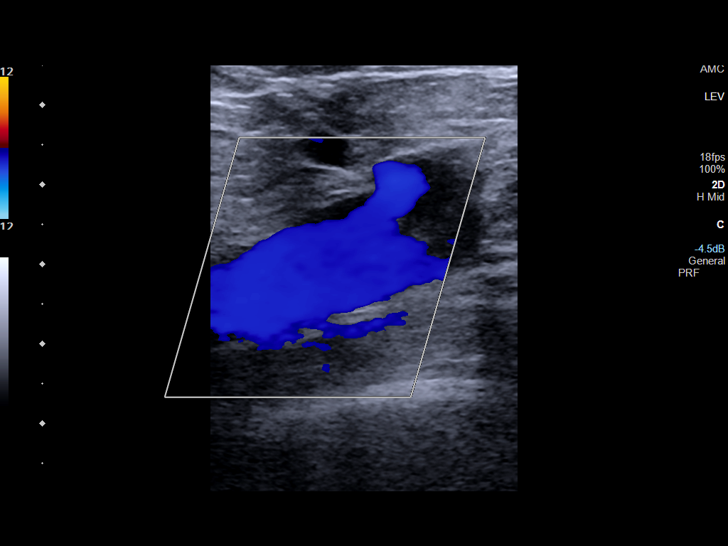
[im 13/73]
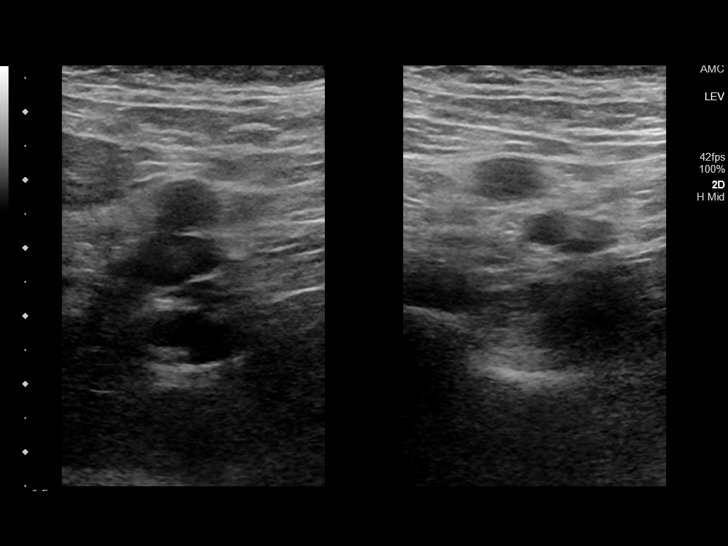
[im 19/73]
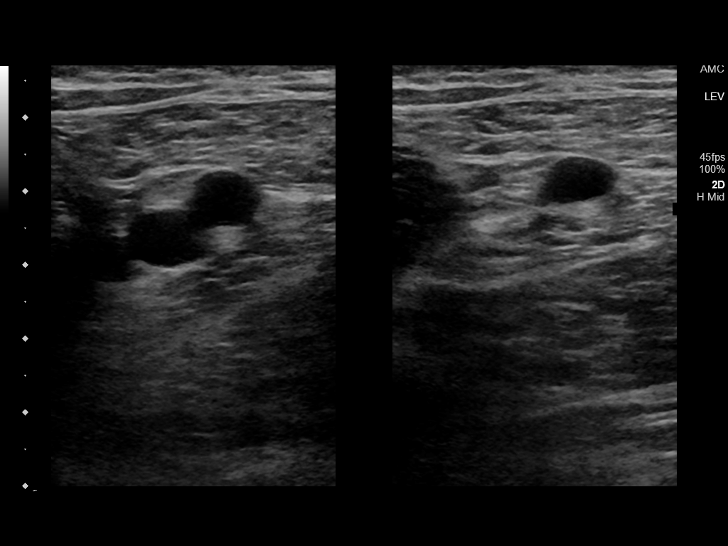
[im 26/73]
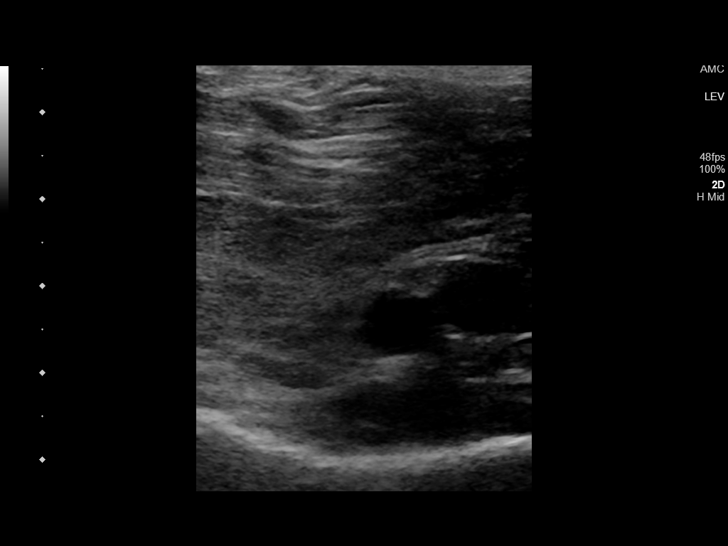
[im 32/73]
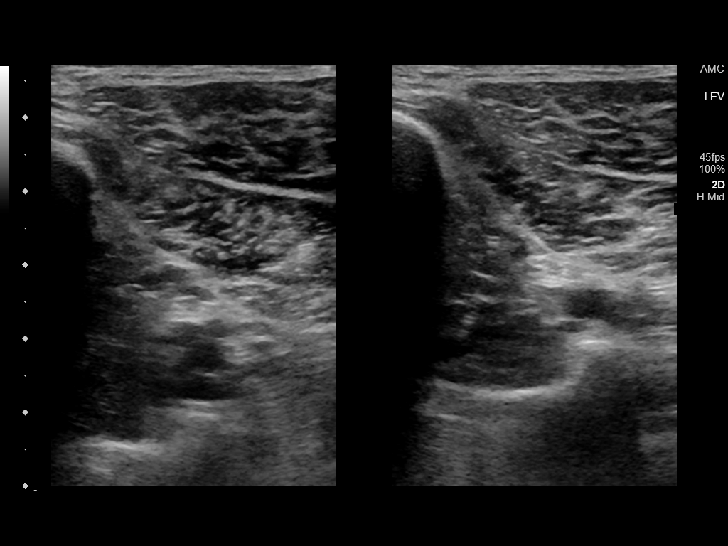
[im 38/73]
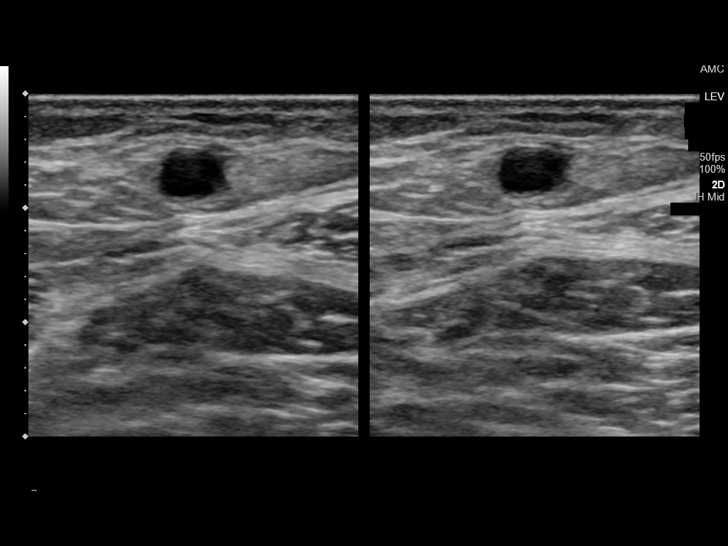
[im 41/73]
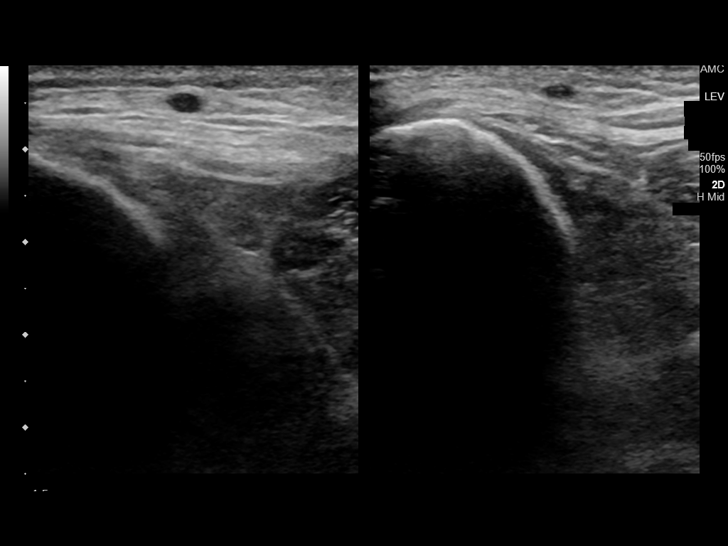
[im 47/73]
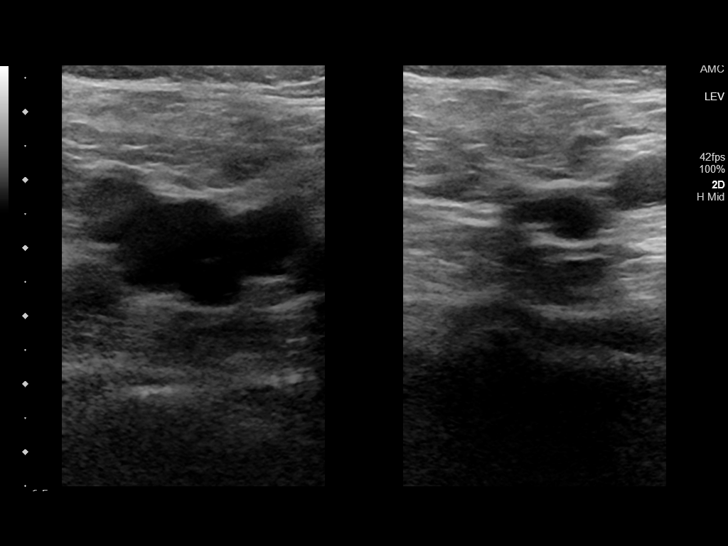
[im 54/73]
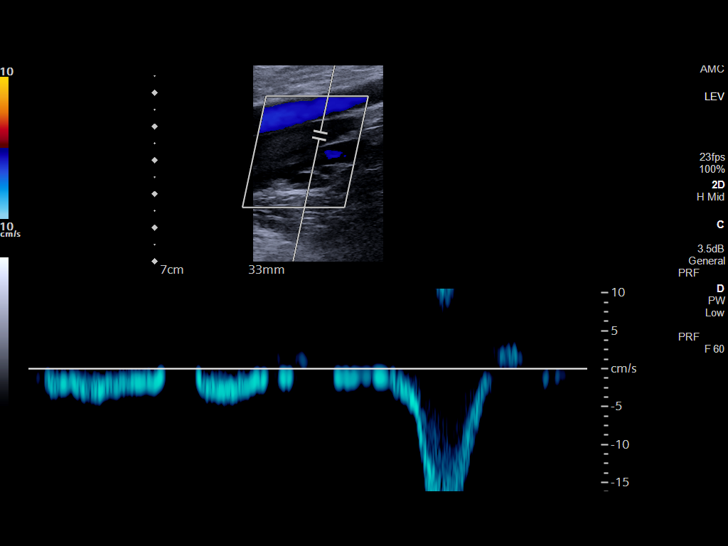
[im 60/73]
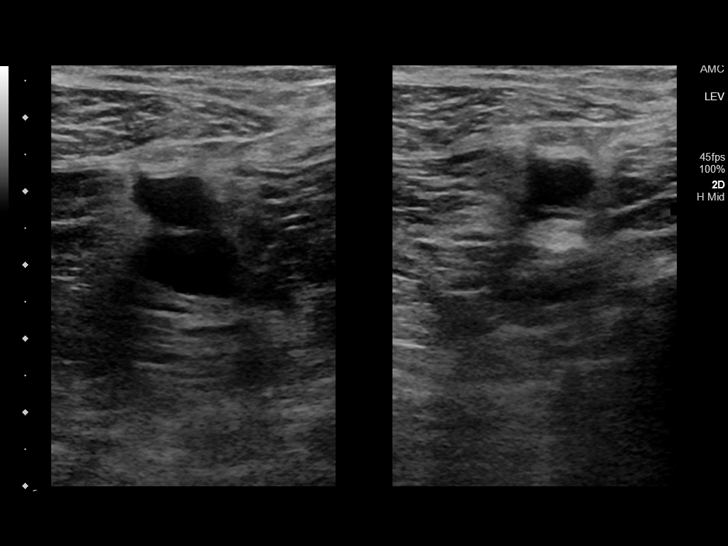
[im 66/73]
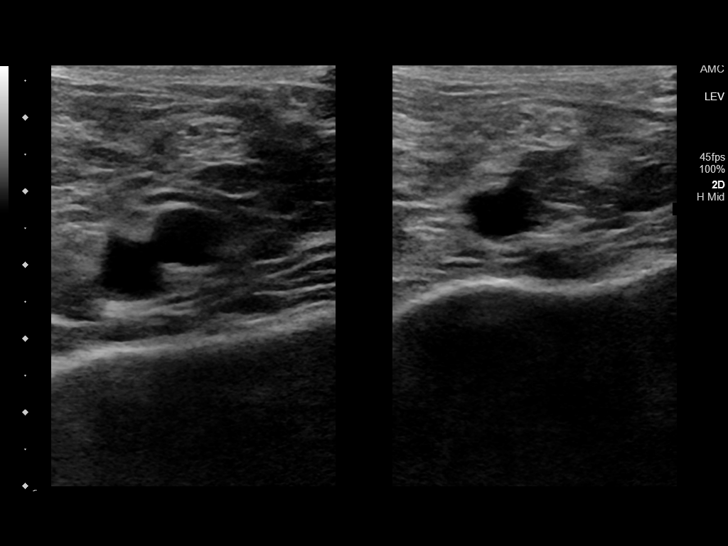
[im 73/73]
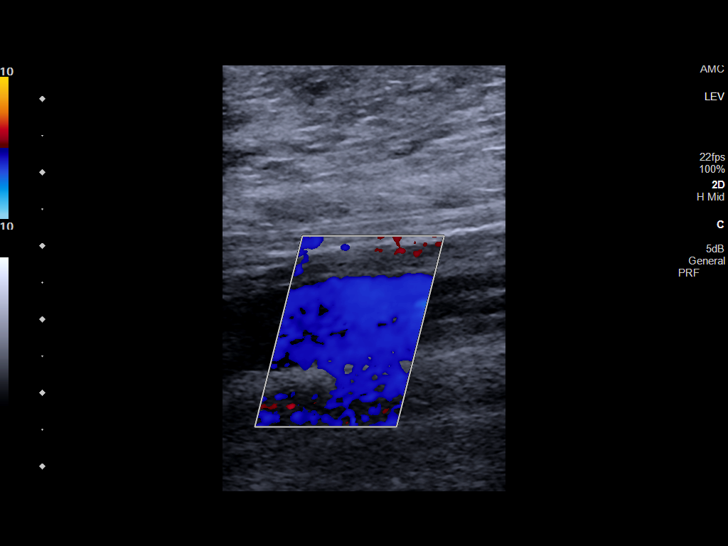

[13 of 24 positions shown; findings below may reference images not displayed]

FINDINGS: RIGHT LOWER EXTREMITY

Common Femoral Vein: Positive for thrombus which extends from the
common femoral vein into the saphenofemoral junction all the down
the greater saphenous vein to the knee.

Saphenofemoral Junction: Positive for occlusive thrombus.

Profunda Femoral Vein: No evidence of thrombus. Normal
compressibility and flow on color Doppler imaging.

Femoral Vein: No evidence of thrombus. Normal compressibility,
respiratory phasicity and response to augmentation.

Popliteal Vein: No evidence of thrombus. Normal compressibility,
respiratory phasicity and response to augmentation.

Calf Veins: No evidence of thrombus. Normal compressibility and flow
on color Doppler imaging.

Superficial Great Saphenous Vein: Occlusive thrombus is identified
throughout the greater saphenous vein.

Venous Reflux:  None.

Other Findings:  None.

LEFT LOWER EXTREMITY

Common Femoral Vein: No evidence of thrombus. Normal
compressibility, respiratory phasicity and response to augmentation.

Saphenofemoral Junction: No evidence of thrombus. Normal
compressibility and flow on color Doppler imaging.

Profunda Femoral Vein: No evidence of thrombus. Normal
compressibility and flow on color Doppler imaging.

Femoral Vein: No evidence of thrombus. Normal compressibility,
respiratory phasicity and response to augmentation.

Popliteal Vein: No evidence of thrombus. Normal compressibility,
respiratory phasicity and response to augmentation.

Calf Veins: No evidence of thrombus. Normal compressibility and flow
on color Doppler imaging.

Superficial Great Saphenous Vein: No evidence of thrombus. Normal
compressibility.

Venous Reflux:  None.

Other Findings:  None.
IMPRESSION: Positive for right lower extremity DVT involving the common femoral
vein, saphenofemoral junction and greater saphenous vein.

No evidence for left lower extremity DVT.

## 2021-06-23 IMAGING — CR DG CHEST 2V
1 series · 2 of 2 positions shown · non-contrast
Comparison: Eleven days ago

CLINICAL DATA: Hemoptysis

EXAM:
CHEST - 2 VIEW

[Series 1: dg chest 2 view · 0.14mm/px · 2 of 2 slices shown]
[im 1/2]
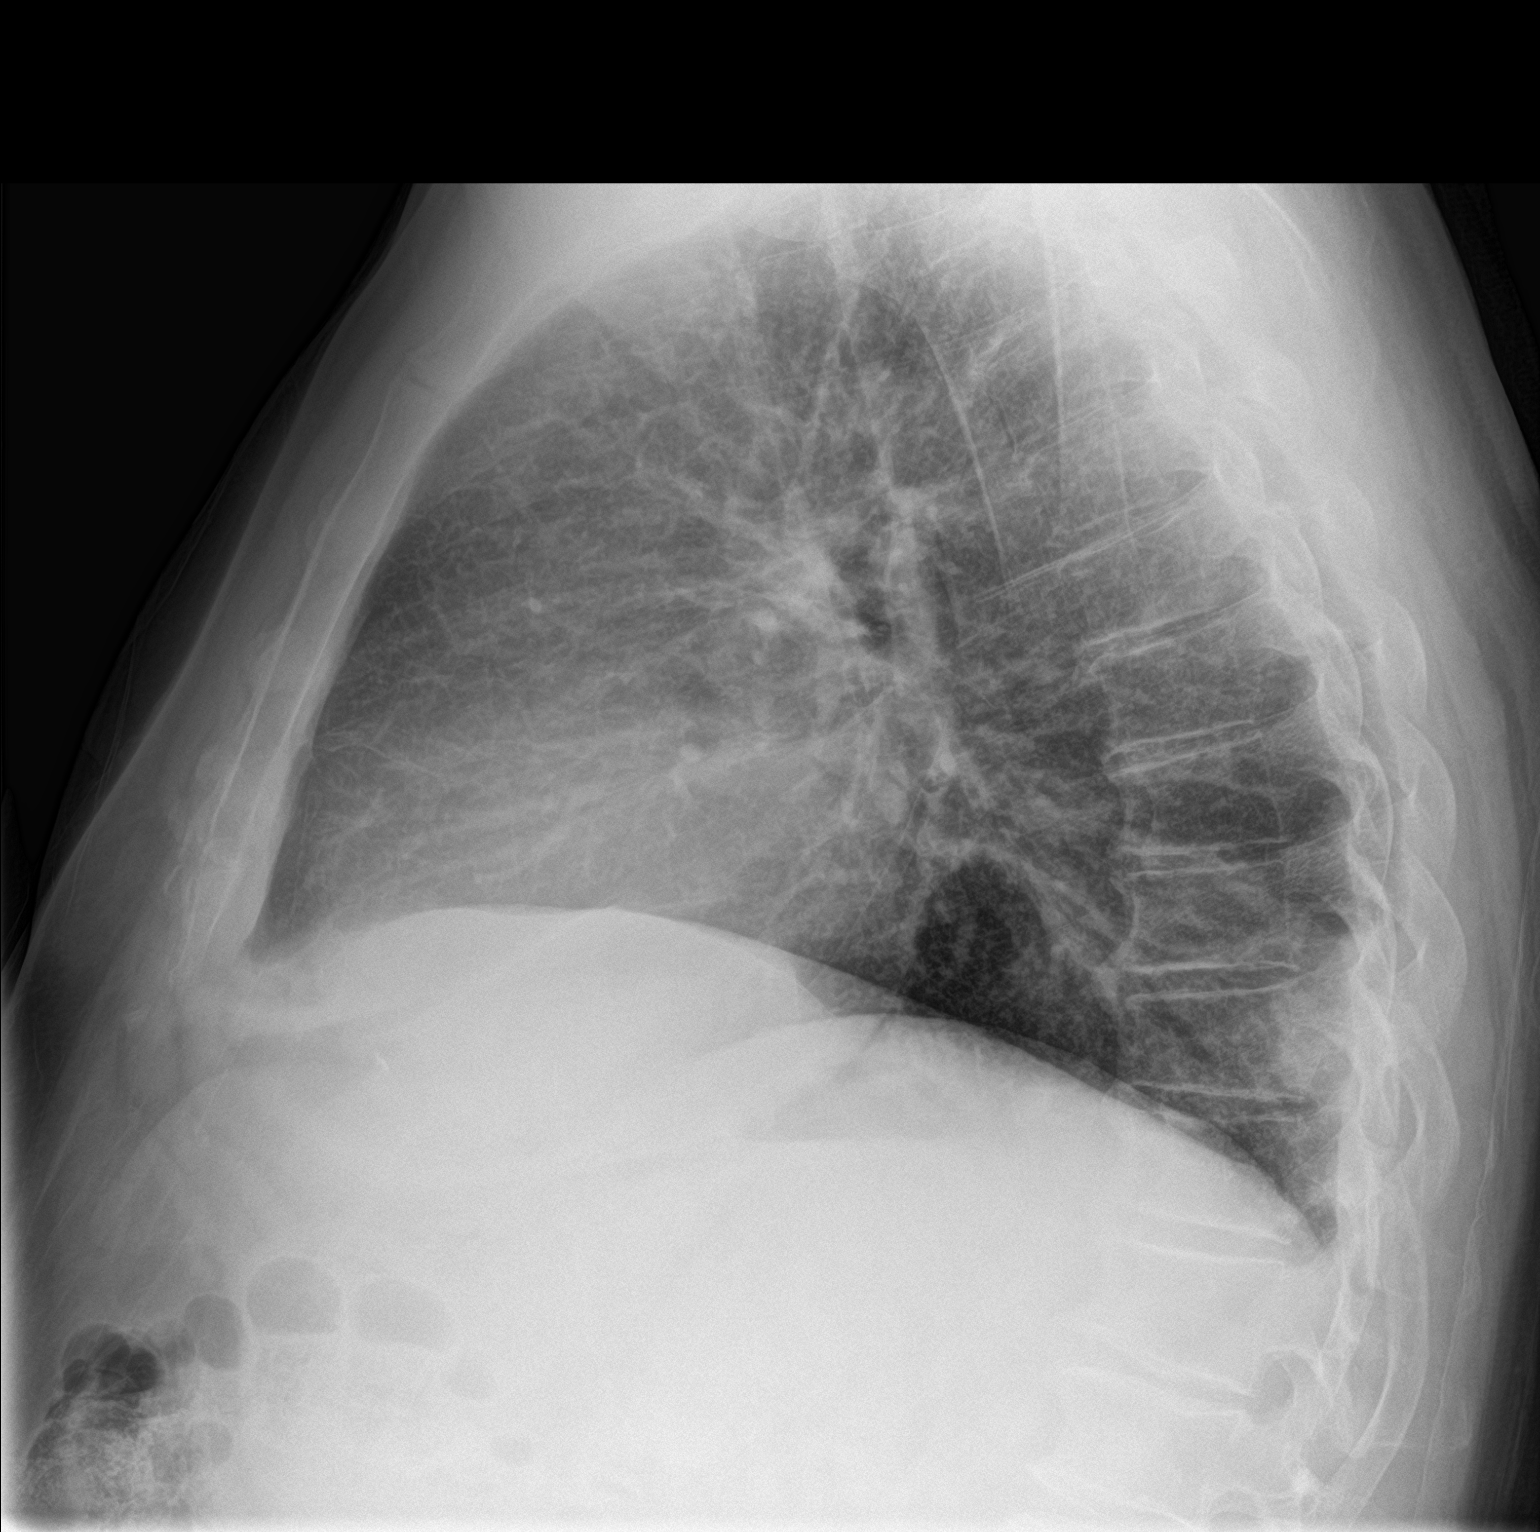
[im 2/2]
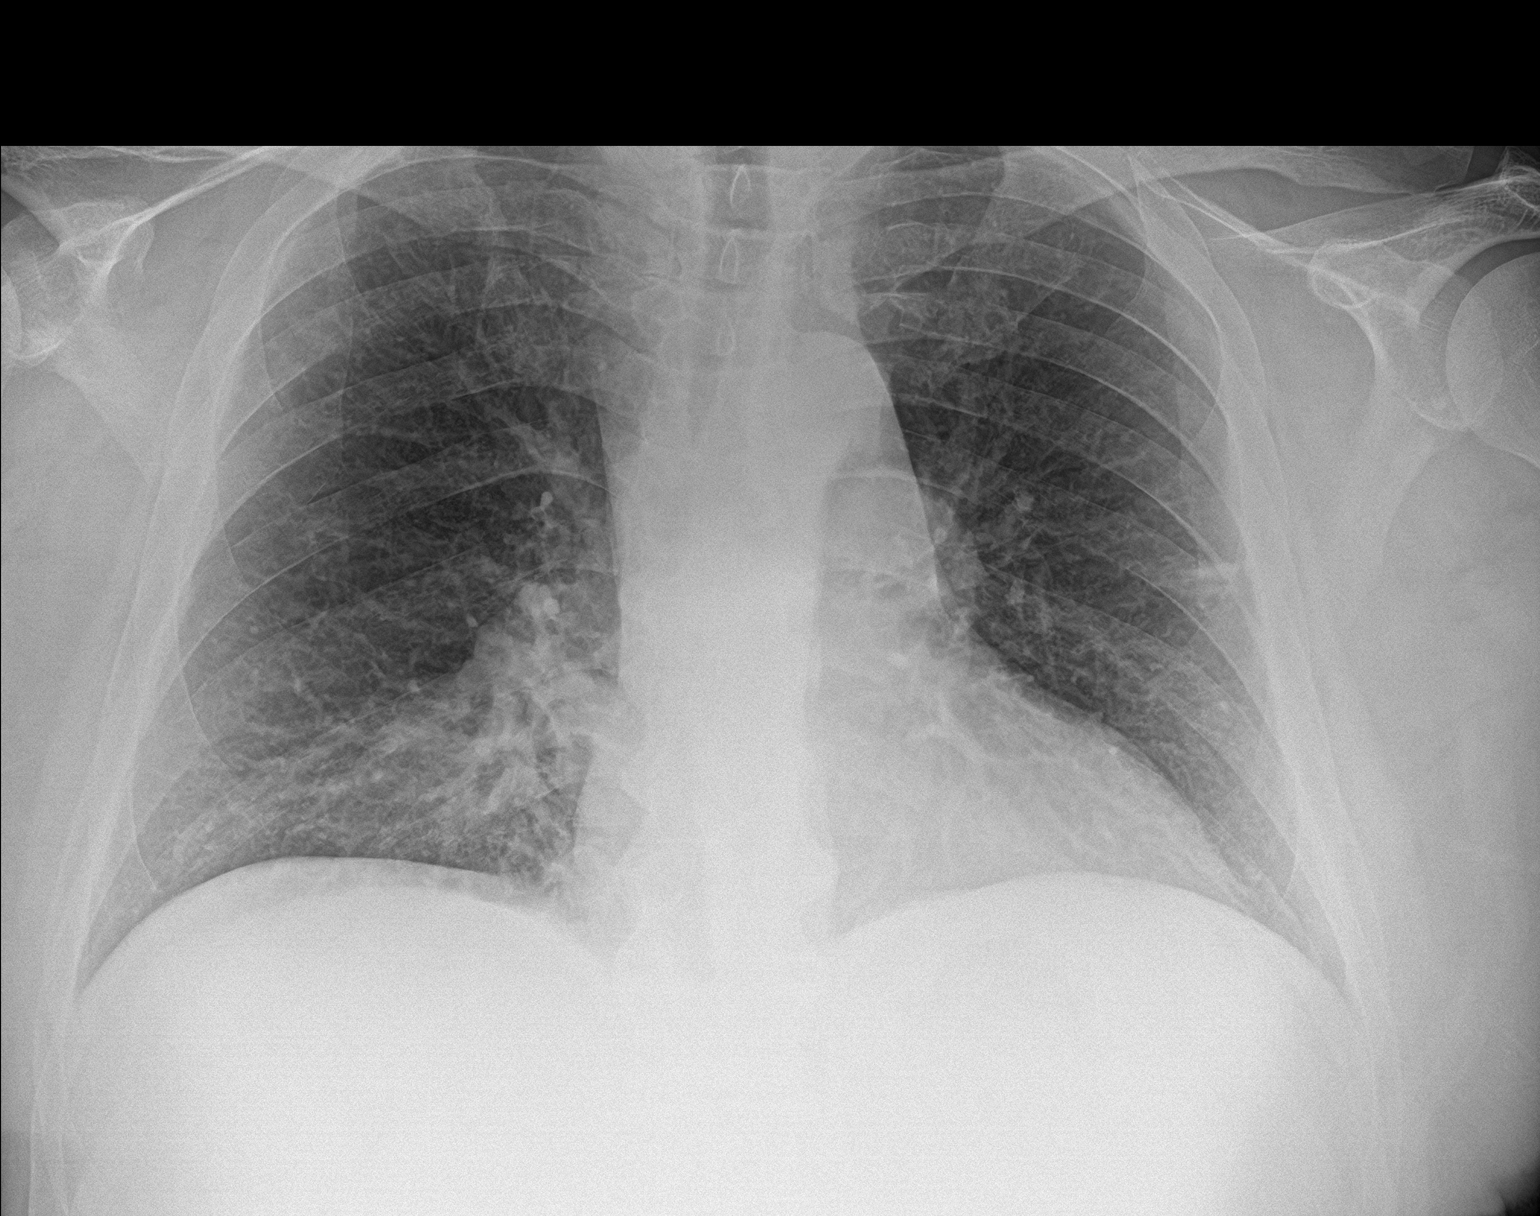

[2 of 2 positions shown; findings below may reference images not displayed]

FINDINGS: No convincing infiltrate. Small area of increased density over the
lower thoracic posterior elements is most likely from overlapping
ribs. No air bronchograms, subpleural wedge of opacity, edema,
effusion, or pneumothorax. Chronic cardiomegaly. Degenerative
endplate spurring.
IMPRESSION: Cardiomegaly without failure.

## 2021-06-25 ENCOUNTER — Telehealth (HOSPITAL_COMMUNITY): Payer: Self-pay

## 2021-06-25 NOTE — Telephone Encounter (Signed)
Called to confirm/remind patient of their appointment at the [facility] on 06/26/21.   Patient reminded to bring all medications and/or complete list.  However, patient said he needed to reschedule and he'll give our office a call back.

## 2021-06-25 NOTE — Progress Notes (Incomplete)
PCP: Virginia Crews, MD Cardiology: [name] HF Cardiology: Dr. Aundra Dubin  50 y.o. with history of nonischemic cardiomyopathy, HTN, and recent DVT/PE.  Patient has had a known nonischemic cardiomyopathy for a number of years.  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  Most recent echo in 4/21 showed EF 25-30%, diffuse hypokinesis. He used to drink heavily but has had rare ETOH for 7-8 years.  Rare smoking.    Admitted 5/21 with PE/DVT in setting of immobility/lack of activity with three hospital admissions in 4/21 and 5/21 prior to admission with PE/DVT.  He was placed on Eliquis.   Admitted with shortness of breath and chest pain. He had been taking his medications intermittently because he could not afford them. This was in the setting of demand ischemia and a/c systolic heart failure.Had RHC/LHC that showed severe single-vessel coronary artery disease with chronic total occlusion of the proximal RCA, low output, and elevated PVR 7.4 . Diuresed with IV lasix and transitioned to torsemide 60 mg daily. Discharge weight 239 pounds.   Today he returns for post hospital follow up. Overall feeling much better. Denies SOB/PND/Orthopnea. Mild shortness of breath walking up steps. Endorses daytime sleepiness and snoring. No dizziness/syncope. Appetite ok. No fever or chills. Weight at home 233-235  pounds. Taking all medications. He now has Medicaid and has been able to obtain all medications.  Working part time [employer]. Lives with his dad and step mom.   Labs (5/21): K 4.3, creatinine 1.27 Labs 10.2021: Creatinine 1  PMH: 1. Chronic systolic CHF: Nonischemic cardiomyopathy. - LHC (3/18) with 40% RCA stenosis.  - Echo (4/21): EF 25-30%, mild LVH, mild LV dilation.  -Echo (05/2020): EF 35-40% RV normal.  2. RLE DVT/PE: 5/21.  In setting of hospitalizations and immobility.  3. OSA 4. COPD: Prior smoker.  5. H/o NSVT 6. Type 2 diabetes 7. HTN 8. RHC  RA (mean): 9 mmHg RV (S/EDP): 57/10  mmHg PA (S/D, mean): 57/28 (40) mmHg PCWP (mean): 17 mmHg Ao sat: 98% PA sat: 61% Fick CO: 3.1 L/min Fick CI: 1.3 L/min/m^2 PVR: 7.4 Wood units  SH: Out of work.  Rare ETOH, prior heavy drinker but cut back a lot 7-8 years ago.  Smokes rarely.   FH: No h/o venous thromboembolism.  Mother with CHF later in life, cause uncertain.   ROS: All systems reviewed and negative except as per HPI.   Current Outpatient Medications  Medication Sig Dispense Refill   carvedilol (COREG) 6.25 MG tablet Take 1 tablet (6.25 mg total) by mouth 2 (two) times daily with a meal. 180 tablet 3   cetirizine (ZYRTEC) 10 MG tablet Take 10 mg by mouth daily.     Dulaglutide (TRULICITY) 2.77 AJ/2.8NO SOPN Inject 0.75 mg into the skin once a week. 4 mL 0   ezetimibe (ZETIA) 10 MG tablet Take 1 tablet (10 mg total) by mouth daily. 90 tablet 3   fenofibrate (TRICOR) 145 MG tablet TAKE 1 TABLET BY MOUTH EVERY DAY 30 tablet 6   Insulin Glargine (BASAGLAR KWIKPEN) 100 UNIT/ML Inject 28 Units into the skin at bedtime. 15 mL 0   insulin lispro (HUMALOG) 100 UNIT/ML KwikPen Inject 3 Units into the skin 3 (three) times daily.     Ipratropium-Albuterol (COMBIVENT RESPIMAT) 20-100 MCG/ACT AERS respimat Inhale 1 puff into the lungs every 6 (six) hours as needed for wheezing.     metolazone (ZAROXOLYN) 5 MG tablet TAKE 1 TABLET (5 MG TOTAL) BY MOUTH DAILY AS NEEDED (FOR WEIGHT  OVER 245). 30 tablet 6   nitroGLYCERIN (NITROSTAT) 0.4 MG SL tablet Place 1 tablet (0.4 mg total) under the tongue every 5 (five) minutes x 3 doses as needed for chest pain. 30 tablet 0   potassium chloride SA (KLOR-CON M20) 20 MEQ tablet Take 1 tablet daily 30 tablet 6   rivaroxaban (XARELTO) 20 MG TABS tablet Take 1 tablet (20 mg total) by mouth daily with supper. 30 tablet 0   rosuvastatin (CRESTOR) 20 MG tablet TAKE 1 TABLET BY MOUTH EVERYDAY AT BEDTIME 30 tablet 6   sacubitril-valsartan (ENTRESTO) 49-51 MG Take 0.5 tablets by mouth 2 (two) times daily.      torsemide (DEMADEX) 20 MG tablet TAKE 2 TABLETS BY MOUTH TWICE A DAY (TAKE AN EXTRA 2 TABLETS AS NEED FOR SHORTNESS OF BREATH) 160 tablet 5   No current facility-administered medications for this visit.   There were no vitals taken for this visit.  Wt Readings from Last 3 Encounters:  05/29/21 109.5 kg (241 lb 8 oz)  05/28/21 109.1 kg (240 lb 8 oz)  05/23/21 107.2 kg (236 lb 5.3 oz)      General:  Well appearing. No resp difficulty HEENT: normal Neck: Thick neck, supple. no JVD. Carotids 2+ bilat; no bruits. No lymphadenopathy or thryomegaly appreciated. Cor: PMI nondisplaced. Regular rate & rhythm. No rubs, gallops or murmurs. Lungs: clear Abdomen: soft, nontender, nondistended. No hepatosplenomegaly. No bruits or masses. Good bowel sounds. Extremities: no cyanosis, clubbing, rash, edema Neuro: alert & orientedx3, cranial nerves grossly intact. moves all 4 extremities w/o difficulty. Affect pleasant  EKG: NSR 92 bpm   Assessment/Plan: 1. Chronic systolic CHF: Nonischemic cardiomyopathy based on 3/18 cath.  Cause uncertain, possibly due to prior ETOH abuse versus HTN versus viral myocarditis => he reports a severe respiratory infection several years ago.  - Echo 06/08/2020 EF 35-40% RV normal -  NYHA III but has had functional improvement.  - Reds Clip 35%. Continue torsemide 60 mg daily - Continue  Entresto 49/51 bid.   - Continue spironolactone 25 mg bid.   - Continue Coreg 25 mg bid.  - Add farxiga 10 mg daily. Check BMET today and in 7 days.  2. OSA: Has had OSA diagnosed years ago but never started CPAP. Set up sleep study.  3. PE/DVT: He was in the hospital multiple times this year with prolonged immobility.  Think this precipitated PE/DVT.   - On xarelto and has the xarelto.  4. CAD: Nonobstructive on last cath.  -No chest pain  - Continue Crestor + xarelto .  5. HTN: Stable.  6. Former Smoker Last smoked in July.   I ambulated him in the clinic and oxygen  saturations were > 94%. Does not need oxygen.   Follow up  In 2 months.   [place] NP-C  06/25/2021

## 2021-06-26 ENCOUNTER — Encounter (HOSPITAL_COMMUNITY): Payer: Medicaid Other

## 2021-06-26 ENCOUNTER — Ambulatory Visit: Payer: Medicaid Other | Admitting: Medical

## 2021-06-29 DIAGNOSIS — I10 Essential (primary) hypertension: Secondary | ICD-10-CM | POA: Diagnosis not present

## 2021-06-29 DIAGNOSIS — G4733 Obstructive sleep apnea (adult) (pediatric): Secondary | ICD-10-CM | POA: Diagnosis not present

## 2021-07-24 ENCOUNTER — Ambulatory Visit: Payer: Medicaid Other | Admitting: Medical

## 2021-07-27 NOTE — Progress Notes (Deleted)
Patient ID: Gabriel [name]., male    DOB: [birth year], 50 y.o.   MRN: [id number]  HPI  Mr Swallows is a 50 y/o male with a history of HTN, diabetes, COPD, CKD, asthma, previous tobacco use and chronic heart failure.   Echo report from 05/21/21 reviewed and showed an EF of 40-45% along with mild LVH and mild MR. Echo report from 06/08/20 reviewed and showed an EF of 35-40% along with mildly elevated PA pressure of 44.7 mmHg, moderate LAE and mild/moderate MR. Echo report from 11/23/19 reviewed and showed an EF of 25-30% with mild LVH and moderately elevated PA Pressure. Echo report from 06/26/2019 reviewed and showed an EF of 30-35% along with moderately elevated PA pressure. Echo done 04/29/17 showed EF 45-50%. Echo report that was done 11/28/16 and it showed an EF of 40-45% along with mild MR. EF has improved from 30-35% July 2017.  RHC/LHC done 06/13/20 and showed: Severe single-vessel coronary artery disease with chronic total occlusion of the proximal RCA.  The distal vessel fills via left to right collaterals. Mild to moderate, nonobstructive CAD involving the LAD and LCx with up to 50% stenosis. Mildly elevated left and right heart filling pressures. Moderate pulmonary hypertension (mean PAP 40 mmHg) with significantly elevated pulmonary vascular resistance (PVR 7.4 WU). Severely reduced Fick cardiac output/index.  Admitted 05/20/21 due to chest pain and shortness of breath. BNP elevated so IV lasix given. Cardiology consult obtained. Entresto and IV lasix had to be held due to rising creatinine. Chronically elevated troponins. Discharged after 3 days.   He presents today for a follow-up visit with a chief complaint of  Says that he's being referred back to the [facility] for consideration of cardiomems.   Past Medical History:  Diagnosis Date   CAD (coronary artery disease)    a. 04/2015 low risk MV;  b. 12/2016 Cath: minor irregs in LAD/Diag/LCX/OM, RCA 40p/m/d; c. 05/2020 Cath: LM nl, LAD 50d,  LCX 30p, OM1 40, RCA 100p w/ L->L collats. CO/CI 3.1/1.3-->Med rx.   Chest wall pain, chronic    CHF (congestive heart failure) (HCC)    Chronic Troponin Elevation    CKD (chronic kidney disease), stage II-III    COPD (chronic obstructive pulmonary disease) (HCC)    Diabetes mellitus without complication (HCC)    HFrEF (heart failure with reduced ejection fraction) ([place])    Hypertension    Mitral regurgitation    Mild to moderate by October 2021 echocardiogram.   Mixed Ischemic & NICM (nonischemic cardiomyopathy) ([place])    a. 03/2015 Echo: EF 45-50%; b. 12/2015 Echo: EF 20-25%; c. 02/2016 Echo: EF 30-35%; d. 11/2016 Echo: EF 40-45%; e. 06/2019 Echo: EF 30-35%; f. 11/2019 Echo: EF 25-30%; g. 05/2020 Echo: EF 35-40%, glob HK, nl RV size/fxn, PASP 44.84mmHg, mod dil LA. Mild to mod MR.   Myocardial infarct (HCC)    NSVT (nonsustained ventricular tachycardia)    a. 12/2015 noted on tele-->amio;  b. 12/2015 Event monitor: no VT noted.   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  12/31/19: RUL and RLL PEs.   Syncope    a. 01/2016 - felt to be vasovagal.   Past Surgical History:  Procedure Laterality Date   AMPUTATION     CARDIAC CATHETERIZATION     FINGER AMPUTATION     Traumatic   FINGER FRACTURE SURGERY Left    LEFT HEART CATH AND CORONARY ANGIOGRAPHY  N/A 01/06/2017   Procedure: Left Heart Cath and Coronary Angiography;  Surgeon: Wellington Hampshire, MD;  Location: [facility];  Service: Cardiovascular;  Laterality: N/A;   RIGHT/LEFT HEART CATH AND CORONARY ANGIOGRAPHY N/A 06/13/2020   Procedure: RIGHT/LEFT HEART CATH AND CORONARY ANGIOGRAPHY;  Surgeon: Nelva Bush, MD;  Location: [facility];  Service: Cardiovascular;  Laterality: N/A;   Family History  Problem Relation Age of Onset   Diabetes Mother    Diabetes Mellitus II Mother    Hypothyroidism Mother    Hypertension Mother    Kidney failure  Mother        Dialysis   Heart attack Mother        32 yo approximately   Hypertension Father    Gout Father    Cancer Maternal Grandfather    Diabetes Maternal Grandfather    Cancer Paternal Aunt    Social History   Tobacco Use   Smoking status: Former    Packs/day: 0.50    Years: 33.00    Pack years: 16.50    Types: Cigarettes    Quit date: 05/08/2020    Years since quitting: 1.2   Smokeless tobacco: Former    Quit date: 05/08/2020   Tobacco comments:    Quit Sept 2021  Substance Use Topics   Alcohol use: Yes    Alcohol/week: 0.0 standard drinks    Comment: occassionally   Allergies  Allergen Reactions   Metformin And Related Nausea And Vomiting   Prednisone Other (See Comments)    Reaction: Hallucinations     Zantac [Ranitidine Hcl] Diarrhea and Nausea Only    Night sweats     Review of Systems  Constitutional:  Negative for appetite change and fatigue.  HENT:  Positive for postnasal drip. Negative for congestion and sore throat.   Eyes: Negative.   Respiratory:  Positive for cough. Negative for chest tightness, shortness of breath and wheezing.   Cardiovascular:  Positive for chest pain (intermittent). Negative for palpitations and leg swelling.  Gastrointestinal:  Negative for abdominal distention and abdominal pain.  Endocrine: Negative.   Genitourinary: Negative.   Skin: Negative.   Allergic/Immunologic: Negative.   Neurological:  Negative for dizziness and light-headedness.  Hematological:  Negative for adenopathy. Does not bruise/bleed easily.  Psychiatric/Behavioral:  Negative for dysphoric mood, sleep disturbance (sleeping on 1 pillow) and suicidal ideas. The patient is not nervous/anxious.       Physical Exam Vitals and nursing note reviewed.  Constitutional:      Appearance: He is well-developed.  HENT:     Head: Normocephalic and atraumatic.  Neck:     Vascular: No JVD.  Cardiovascular:     Rate and Rhythm: Normal rate and regular rhythm.   Pulmonary:     Effort: Pulmonary effort is normal.     Breath sounds: No wheezing or rales.  Chest:     Chest wall: No tenderness.  Abdominal:     General: There is no distension.     Palpations: Abdomen is soft.     Tenderness: There is no abdominal tenderness.  Musculoskeletal:        General: No tenderness.     Cervical back: Normal range of motion and neck supple.     Right lower leg: No edema.     Left lower leg: No edema.  Skin:    General: Skin is warm and dry.  Neurological:     Mental Status: He is alert and oriented to person,  place, and time.  Psychiatric:        Behavior: Behavior normal.        Thought Content: Thought content normal.   Assessment & Plan:  1: Chronic heart failure with reduced ejection fraction- - NYHA class I - euvolemic today  - weighing daily and reminded to call for an overnight weight gain of >2 pounds or a weekly weight gain of >5 pounds; weight at home has been rising.  - weight 241.8 pounds since last visit here 2 months ago - has order in place to take metolazone if his weight is >240 pounds - not adding salt and he was encouraged to closely follow a 2000mg  sodium diet - saw cardiology Kathlen Mody) 06/18/21 - on GDMT of jardiance, carvedilol & entresto - saw EP Caryl Comes) 09/02/19; discussion about ICD  - saw provider at [facility] 07/06/20; being referred back to them for consideration of cardiomems - BNP from 05/20/21 was 2525.3  2: HTN- - BP  - saw PCP @ [facility] 04/13/20; has medicaid now  - BMP from 06/18/21 reviewed and showed sodium 143, potassium 4.1, creatinine 1.26 and GFR 69  3: Diabetes-   -  A1c 8.1% on 03/28/21 - glucose today was  - completed some classes at the [facility]   Patient did not bring his medications nor a list. Each medication was verbally reviewed with the patient and he was encouraged to bring the bottles to every visit to confirm accuracy of list.

## 2021-07-31 ENCOUNTER — Ambulatory Visit: Payer: Medicaid Other | Admitting: Family

## 2021-07-31 ENCOUNTER — Telehealth: Payer: Self-pay | Admitting: Family

## 2021-07-31 NOTE — Telephone Encounter (Signed)
Patient did not show for his [facility] appointment on 07/31/21. Will attempt to reschedule.   °

## 2021-08-06 ENCOUNTER — Ambulatory Visit: Payer: Medicaid Other | Admitting: Cardiovascular Disease

## 2021-08-09 ENCOUNTER — Emergency Department: Payer: Medicaid Other

## 2021-08-09 ENCOUNTER — Other Ambulatory Visit: Payer: Self-pay

## 2021-08-09 ENCOUNTER — Inpatient Hospital Stay
Admission: EM | Admit: 2021-08-09 | Discharge: 2021-08-13 | DRG: 291 | Disposition: A | Discharge status: Home / Self Care | Payer: Medicaid Other | Attending: Hospitalist | Admitting: Hospitalist

## 2021-08-09 DIAGNOSIS — R042 Hemoptysis: Secondary | ICD-10-CM | POA: Diagnosis not present

## 2021-08-09 DIAGNOSIS — I5023 Acute on chronic systolic (congestive) heart failure: Secondary | ICD-10-CM | POA: Diagnosis not present

## 2021-08-09 DIAGNOSIS — Z888 Allergy status to other drugs, medicaments and biological substances status: Secondary | ICD-10-CM

## 2021-08-09 DIAGNOSIS — G4733 Obstructive sleep apnea (adult) (pediatric): Secondary | ICD-10-CM | POA: Diagnosis present

## 2021-08-09 DIAGNOSIS — Z7901 Long term (current) use of anticoagulants: Secondary | ICD-10-CM

## 2021-08-09 DIAGNOSIS — E1165 Type 2 diabetes mellitus with hyperglycemia: Secondary | ICD-10-CM | POA: Diagnosis present

## 2021-08-09 DIAGNOSIS — Z833 Family history of diabetes mellitus: Secondary | ICD-10-CM

## 2021-08-09 DIAGNOSIS — I340 Nonrheumatic mitral (valve) insufficiency: Secondary | ICD-10-CM | POA: Diagnosis present

## 2021-08-09 DIAGNOSIS — Z794 Long term (current) use of insulin: Secondary | ICD-10-CM

## 2021-08-09 DIAGNOSIS — Z20822 Contact with and (suspected) exposure to covid-19: Secondary | ICD-10-CM | POA: Diagnosis present

## 2021-08-09 DIAGNOSIS — N182 Chronic kidney disease, stage 2 (mild): Secondary | ICD-10-CM | POA: Diagnosis present

## 2021-08-09 DIAGNOSIS — I2510 Atherosclerotic heart disease of native coronary artery without angina pectoris: Secondary | ICD-10-CM | POA: Diagnosis present

## 2021-08-09 DIAGNOSIS — Z86718 Personal history of other venous thrombosis and embolism: Secondary | ICD-10-CM

## 2021-08-09 DIAGNOSIS — I5043 Acute on chronic combined systolic (congestive) and diastolic (congestive) heart failure: Secondary | ICD-10-CM | POA: Diagnosis present

## 2021-08-09 DIAGNOSIS — R0600 Dyspnea, unspecified: Secondary | ICD-10-CM | POA: Diagnosis present

## 2021-08-09 DIAGNOSIS — E1122 Type 2 diabetes mellitus with diabetic chronic kidney disease: Secondary | ICD-10-CM | POA: Diagnosis present

## 2021-08-09 DIAGNOSIS — E1169 Type 2 diabetes mellitus with other specified complication: Secondary | ICD-10-CM

## 2021-08-09 DIAGNOSIS — J449 Chronic obstructive pulmonary disease, unspecified: Secondary | ICD-10-CM | POA: Diagnosis present

## 2021-08-09 DIAGNOSIS — I472 Ventricular tachycardia, unspecified: Secondary | ICD-10-CM | POA: Diagnosis present

## 2021-08-09 DIAGNOSIS — Z841 Family history of disorders of kidney and ureter: Secondary | ICD-10-CM

## 2021-08-09 DIAGNOSIS — Z8249 Family history of ischemic heart disease and other diseases of the circulatory system: Secondary | ICD-10-CM

## 2021-08-09 DIAGNOSIS — Z6831 Body mass index (BMI) 31.0-31.9, adult: Secondary | ICD-10-CM

## 2021-08-09 DIAGNOSIS — R0602 Shortness of breath: Secondary | ICD-10-CM | POA: Diagnosis not present

## 2021-08-09 DIAGNOSIS — I130 Hypertensive heart and chronic kidney disease with heart failure and stage 1 through stage 4 chronic kidney disease, or unspecified chronic kidney disease: Principal | ICD-10-CM | POA: Diagnosis present

## 2021-08-09 DIAGNOSIS — R918 Other nonspecific abnormal finding of lung field: Secondary | ICD-10-CM | POA: Diagnosis not present

## 2021-08-09 DIAGNOSIS — Z87891 Personal history of nicotine dependence: Secondary | ICD-10-CM

## 2021-08-09 DIAGNOSIS — N179 Acute kidney failure, unspecified: Secondary | ICD-10-CM | POA: Diagnosis present

## 2021-08-09 DIAGNOSIS — I252 Old myocardial infarction: Secondary | ICD-10-CM

## 2021-08-09 DIAGNOSIS — E669 Obesity, unspecified: Secondary | ICD-10-CM | POA: Diagnosis present

## 2021-08-09 DIAGNOSIS — I428 Other cardiomyopathies: Secondary | ICD-10-CM | POA: Diagnosis present

## 2021-08-09 DIAGNOSIS — Z79899 Other long term (current) drug therapy: Secondary | ICD-10-CM

## 2021-08-09 DIAGNOSIS — Z86711 Personal history of pulmonary embolism: Secondary | ICD-10-CM

## 2021-08-09 LAB — BASIC METABOLIC PANEL
Anion gap: 12 (ref 5–15)
Anion gap: 11 (ref 5–15)
BUN: 30 mg/dL — ABNORMAL HIGH (ref 6–20)
BUN: 34 mg/dL — ABNORMAL HIGH (ref 6–20)
CO2: 25 mmol/L (ref 22–32)
CO2: 27 mmol/L (ref 22–32)
Calcium: 9.8 mg/dL (ref 8.9–10.3)
Calcium: 9.3 mg/dL (ref 8.9–10.3)
Chloride: 97 mmol/L — ABNORMAL LOW (ref 98–111)
Chloride: 96 mmol/L — ABNORMAL LOW (ref 98–111)
Creatinine, Ser: 1.37 mg/dL — ABNORMAL HIGH (ref 0.61–1.24)
Creatinine, Ser: 1.54 mg/dL — ABNORMAL HIGH (ref 0.61–1.24)
GFR, Estimated: >60 mL/min (ref >60)
GFR, Estimated: 54 mL/min — ABNORMAL LOW (ref >60)
Glucose, Bld: 381 mg/dL — ABNORMAL HIGH (ref 70–99)
Glucose, Bld: 315 mg/dL — ABNORMAL HIGH (ref 70–99)
Potassium: 3.5 mmol/L (ref 3.5–5.1)
Potassium: 3.3 mmol/L — ABNORMAL LOW (ref 3.5–5.1)
Sodium: 134 mmol/L — ABNORMAL LOW (ref 135–145)
Sodium: 134 mmol/L — ABNORMAL LOW (ref 135–145)

## 2021-08-09 LAB — CBC
HCT: 50.9 % (ref 39.0–52.0)
Hemoglobin: 17.7 g/dL — ABNORMAL HIGH (ref 13.0–17.0)
MCH: 28.3 pg (ref 26.0–34.0)
MCHC: 34.8 g/dL (ref 30.0–36.0)
MCV: 81.3 fL (ref 80.0–100.0)
Platelets: 220 10*3/uL (ref 150–400)
RBC: 6.26 MIL/uL — ABNORMAL HIGH (ref 4.22–5.81)
RDW: 14 % (ref 11.5–15.5)
WBC: 9.3 10*3/uL (ref 4.0–10.5)
nRBC: 0 % (ref 0.0–0.2)

## 2021-08-09 LAB — BRAIN NATRIURETIC PEPTIDE: B Natriuretic Peptide: 487.8 pg/mL — ABNORMAL HIGH (ref 0.0–100.0)

## 2021-08-09 LAB — RESP PANEL BY RT-PCR (FLU A&B, COVID) ARPGX2
Influenza A by PCR: NEGATIVE
Influenza B by PCR: NEGATIVE
SARS Coronavirus 2 by RT PCR: NEGATIVE

## 2021-08-09 LAB — MAGNESIUM: Magnesium: 1.6 mg/dL — ABNORMAL LOW (ref 1.7–2.4)

## 2021-08-09 LAB — TROPONIN I (HIGH SENSITIVITY)
Troponin I (High Sensitivity): 54 ng/L — ABNORMAL HIGH (ref <18)
Troponin I (High Sensitivity): 46 ng/L — ABNORMAL HIGH (ref <18)

## 2021-08-09 MED ORDER — ACETAMINOPHEN 325 MG PO TABS
650.0000 mg | ORAL_TABLET | ORAL | Status: DC | PRN
  Administered 2021-08-09: 17:00:00 650 mg via ORAL
  Filled 2021-08-09: qty 2

## 2021-08-09 MED ORDER — EZETIMIBE 10 MG PO TABS
10.0000 mg | ORAL_TABLET | Freq: Every day | ORAL | Status: DC
  Administered 2021-08-10 – 2021-08-13 (×4): 10 mg via ORAL
  Filled 2021-08-09 (×4): qty 1

## 2021-08-09 MED ORDER — SODIUM CHLORIDE 0.9 % IV SOLN: 250.0000 mL | INTRAVENOUS | Status: DC | PRN

## 2021-08-09 MED ORDER — SODIUM CHLORIDE 0.9% FLUSH
3.0000 mL | Freq: Two times a day (BID) | INTRAVENOUS | Status: DC
  Administered 2021-08-09 – 2021-08-13 (×8): 3 mL via INTRAVENOUS

## 2021-08-09 MED ORDER — POTASSIUM CHLORIDE CRYS ER 20 MEQ PO TBCR
40.0000 meq | EXTENDED_RELEASE_TABLET | Freq: Once | ORAL | Status: AC
  Administered 2021-08-10: 01:00:00 40 meq via ORAL
  Filled 2021-08-09: qty 2

## 2021-08-09 MED ORDER — INSULIN LISPRO (1 UNIT DIAL) 100 UNIT/ML (KWIKPEN): 3.0000 [IU] | PEN_INJECTOR | Freq: Three times a day (TID) | SUBCUTANEOUS | Status: DC

## 2021-08-09 MED ORDER — POTASSIUM CHLORIDE CRYS ER 20 MEQ PO TBCR
20.0000 meq | EXTENDED_RELEASE_TABLET | Freq: Two times a day (BID) | ORAL | Status: DC
  Administered 2021-08-09 – 2021-08-13 (×9): 20 meq via ORAL
  Filled 2021-08-09 (×9): qty 1

## 2021-08-09 MED ORDER — IOHEXOL 350 MG/ML SOLN
75.0000 mL | Freq: Once | INTRAVENOUS | Status: AC | PRN
  Administered 2021-08-09: 14:00:00 75 mL via INTRAVENOUS

## 2021-08-09 MED ORDER — HYOSCYAMINE SULFATE 0.125 MG PO TABS
0.1250 mg | ORAL_TABLET | Freq: Four times a day (QID) | ORAL | Status: DC | PRN
  Filled 2021-08-09 (×3): qty 1

## 2021-08-09 MED ORDER — FUROSEMIDE 10 MG/ML IJ SOLN
60.0000 mg | Freq: Once | INTRAMUSCULAR | Status: AC
  Administered 2021-08-09: 15:00:00 60 mg via INTRAVENOUS
  Filled 2021-08-09: qty 8

## 2021-08-09 MED ORDER — MAGNESIUM SULFATE 4 GM/100ML IV SOLN
4.0000 g | Freq: Once | INTRAVENOUS | Status: AC
  Administered 2021-08-10: 01:00:00 4 g via INTRAVENOUS
  Filled 2021-08-09: qty 100

## 2021-08-09 MED ORDER — SODIUM CHLORIDE 0.9% FLUSH: 3.0000 mL | INTRAVENOUS | Status: DC | PRN

## 2021-08-09 MED ORDER — IPRATROPIUM-ALBUTEROL 20-100 MCG/ACT IN AERS: 1.0000 | INHALATION_SPRAY | Freq: Four times a day (QID) | RESPIRATORY_TRACT | Status: DC | PRN

## 2021-08-09 MED ORDER — ROSUVASTATIN CALCIUM 20 MG PO TABS
20.0000 mg | ORAL_TABLET | Freq: Every day | ORAL | Status: DC
  Administered 2021-08-09 – 2021-08-12 (×4): 20 mg via ORAL
  Filled 2021-08-09 (×6): qty 1

## 2021-08-09 MED ORDER — FUROSEMIDE 10 MG/ML IJ SOLN
60.0000 mg | Freq: Two times a day (BID) | INTRAMUSCULAR | Status: DC
  Administered 2021-08-09 – 2021-08-10 (×2): 60 mg via INTRAVENOUS
  Filled 2021-08-09 (×2): qty 8

## 2021-08-09 MED ORDER — HYOSCYAMINE SULFATE 0.125 MG PO TBDP
0.1250 mg | ORAL_TABLET | Freq: Four times a day (QID) | ORAL | Status: DC | PRN
  Administered 2021-08-09 – 2021-08-12 (×2): 0.125 mg via ORAL
  Filled 2021-08-09 (×6): qty 1

## 2021-08-09 MED ORDER — RIVAROXABAN 20 MG PO TABS
20.0000 mg | ORAL_TABLET | Freq: Every day | ORAL | Status: DC
  Administered 2021-08-09 – 2021-08-12 (×4): 20 mg via ORAL
  Filled 2021-08-09 (×5): qty 1

## 2021-08-09 MED ORDER — METHOCARBAMOL 500 MG PO TABS
500.0000 mg | ORAL_TABLET | Freq: Three times a day (TID) | ORAL | Status: DC | PRN
  Administered 2021-08-09: 23:00:00 500 mg via ORAL
  Filled 2021-08-09 (×4): qty 1

## 2021-08-09 MED ORDER — CARVEDILOL 6.25 MG PO TABS
6.2500 mg | ORAL_TABLET | Freq: Two times a day (BID) | ORAL | Status: DC
  Administered 2021-08-09 – 2021-08-13 (×8): 6.25 mg via ORAL
  Filled 2021-08-09 (×9): qty 1

## 2021-08-09 MED ORDER — INSULIN ASPART 100 UNIT/ML IJ SOLN
3.0000 [IU] | Freq: Three times a day (TID) | INTRAMUSCULAR | Status: DC
  Administered 2021-08-09 – 2021-08-11 (×7): 3 [IU] via SUBCUTANEOUS
  Filled 2021-08-09 (×7): qty 1

## 2021-08-09 MED ORDER — ONDANSETRON HCL 4 MG/2ML IJ SOLN: 4.0000 mg | Freq: Four times a day (QID) | INTRAMUSCULAR | Status: DC | PRN

## 2021-08-09 MED ORDER — INSULIN GLARGINE-YFGN 100 UNIT/ML ~~LOC~~ SOLN
28.0000 [IU] | Freq: Every day | SUBCUTANEOUS | Status: DC
Start: 2021-08-09 — End: 2021-08-10
  Administered 2021-08-09: 22:00:00 28 [IU] via SUBCUTANEOUS
  Filled 2021-08-09 (×3): qty 0.28

## 2021-08-09 MED ORDER — BASAGLAR KWIKPEN 100 UNIT/ML ~~LOC~~ SOPN: 28.0000 [IU] | PEN_INJECTOR | Freq: Every day | SUBCUTANEOUS | Status: DC

## 2021-08-09 MED ORDER — IPRATROPIUM-ALBUTEROL 0.5-2.5 (3) MG/3ML IN SOLN: 3.0000 mL | Freq: Four times a day (QID) | RESPIRATORY_TRACT | Status: DC | PRN

## 2021-08-09 NOTE — ED Notes (Addendum)
Pt given something to eat

## 2021-08-09 NOTE — ED Notes (Signed)
Pt updated on medication orders/ status.

## 2021-08-09 NOTE — ED Notes (Signed)
Med requesting robaxin from pharm

## 2021-08-09 NOTE — ED Provider Notes (Addendum)
[facility] Emergency Department Provider Note ____________________________________________   Event Date/Time   First MD Initiated Contact with Patient 08/09/21 1251     (approximate)  I have reviewed the triage vital signs and the nursing notes.  HISTORY  Chief Complaint Shortness of Breath and Hemoptysis   HPI Gabriel [name]. is a 50 y.o. malewho presents to the ED for evaluation of dyspnea and hemoptysis.  Chart review indicates CAD, reduced ejection fraction of about 35-40%.  History of PE last year treated with Xarelto.  Patient presents to the ED, accompanied by his father, for evaluation of 3 to 4 days of dyspnea and chest discomfort.  He reports running out of his Xarelto last week, and has now been nearly 10 days without this medication.  Reports compliance with his other meds.  Reports developing chest pain and dyspnea over the past 3 to 4 days.  Reports getting winded just talking and minimal movements around the house.  Reports substernal burning chest discomfort that is moderate intensity nonradiating, worse with deep breaths.  Denies syncope.  Does report coughing up some blood 2 days ago.   Past Medical History:  Diagnosis Date   CAD (coronary artery disease)    a. 04/2015 low risk MV;  b. 12/2016 Cath: minor irregs in LAD/Diag/LCX/OM, RCA 40p/m/d; c. 05/2020 Cath: LM nl, LAD 50d, LCX 30p, OM1 40, RCA 100p w/ L->L collats. CO/CI 3.1/1.3-->Med rx.   Chest wall pain, chronic    CHF (congestive heart failure) (HCC)    Chronic Troponin Elevation    CKD (chronic kidney disease), stage II-III    COPD (chronic obstructive pulmonary disease) (HCC)    Diabetes mellitus without complication (HCC)    HFrEF (heart failure with reduced ejection fraction) ([place])    Hypertension    Mitral regurgitation    Mild to moderate by October 2021 echocardiogram.   Mixed Ischemic & NICM (nonischemic cardiomyopathy) ([place])    a. 03/2015 Echo: EF 45-50%; b. 12/2015  Echo: EF 20-25%; c. 02/2016 Echo: EF 30-35%; d. 11/2016 Echo: EF 40-45%; e. 06/2019 Echo: EF 30-35%; f. 11/2019 Echo: EF 25-30%; g. 05/2020 Echo: EF 35-40%, glob HK, nl RV size/fxn, PASP 44.33mmHg, mod dil LA. Mild to mod MR.   Myocardial infarct (HCC)    NSVT (nonsustained ventricular tachycardia)    a. 12/2015 noted on tele-->amio;  b. 12/2015 Event monitor: no VT noted.   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  12/31/19: RUL and RLL PEs.   Syncope    a. 01/2016 - felt to be vasovagal.    Patient Active Problem List   Diagnosis Date Noted   Type II or unspecified type diabetes mellitus without mention of complication, not stated as uncontrolled 05/21/2021   Acute on chronic diastolic CHF (congestive heart failure) ([place]) 05/21/2021   Atherosclerosis of native coronary artery of native heart with unstable angina pectoris ([place]) 05/20/2021   Long term current use of anticoagulant 05/20/2021   Mitral regurgitation 05/20/2021   Mediastinal lymphadenopathy 05/20/2021   Acalculous cholecystitis 03/29/2021   Right upper quadrant abdominal pain 03/28/2021   AKI (acute kidney injury) ([place]) 11/24/2020   Acute kidney injury ([place]) 11/23/2020   OSA on CPAP 11/23/2020   Chest pain 10/27/2020   Snoring 08/15/2020   Type II diabetes mellitus with renal manifestations ([place]) 06/11/2020   COPD (chronic obstructive pulmonary disease) ([place]) 06/11/2020   Leukocytosis 06/11/2020  PE (pulmonary thromboembolism) ([place]) 06/11/2020   Hyperglycemia    Elevated brain natriuretic peptide (BNP) level    Uncontrolled type 2 diabetes mellitus with hyperglycemia, with long-term current use of insulin ([place]) 05/13/2020   Acute on chronic HFrEF (heart failure with reduced ejection fraction) ([place]) 05/12/2020   Erectile dysfunction due to arterial insufficiency 03/15/2020   Screening PSA (prostate specific antigen) 03/15/2020   Erectile dysfunction  03/02/2020   Acute pulmonary embolism ([place]) 01/03/2020   History of pulmonary embolism    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[place]) 12/31/2019   Hemoptysis    Costochondritis    Acute respiratory failure with hypoxia ([place])    Acute on chronic combined systolic (congestive) and diastolic (congestive) heart failure ([place]) 12/20/2019   Acute on chronic heart failure ([place]) 11/22/2019   CKD (chronic kidney disease), stage IIIa 06/26/2019   Type 2 diabetes mellitus with hyperlipidemia ([place]) 06/26/2019   Hypertensive urgency 06/26/2019   Acute on chronic combined systolic and diastolic CHF (congestive heart failure) ([place]) 09/02/2017   Other chest pain 08/24/2017   Dizziness    Noncompliance with medications 04/29/2017   Back pain 03/06/2017   Tobacco use 12/04/2016   Gallbladder sludge 12/04/2016   Acute on chronic systolic CHF (congestive heart failure) ([place]) 11/28/2016   Coronary artery disease, non-occlusive 03/15/2016   Atypical chest pain 02/16/2016   Orthostatic hypotension 01/23/2016   Hypomagnesemia 94/70/9628   Chronic systolic CHF (congestive heart failure) ([place]) 01/23/2016   NICM (nonischemic cardiomyopathy) ([place]) 01/17/2016   NSVT (nonsustained ventricular tachycardia)    Troponin I above reference range    Mixed hyperlipidemia 05/17/2015   COPD exacerbation ([place]) 05/17/2015   Essential hypertension 03/31/2015   Elevated troponin 03/20/2015    Past Surgical History:  Procedure Laterality Date   AMPUTATION     CARDIAC CATHETERIZATION     FINGER AMPUTATION     Traumatic   FINGER FRACTURE SURGERY Left    LEFT HEART CATH AND CORONARY ANGIOGRAPHY N/A 01/06/2017   Procedure: Left Heart Cath and Coronary Angiography;  Surgeon: Wellington Hampshire, MD;  Location: [facility];  Service: Cardiovascular;  Laterality: N/A;   RIGHT/LEFT HEART CATH AND CORONARY ANGIOGRAPHY N/A 06/13/2020   Procedure: RIGHT/LEFT HEART CATH AND CORONARY ANGIOGRAPHY;  Surgeon: Nelva Bush, MD;  Location: [facility];  Service: Cardiovascular;  Laterality: N/A;    Prior to Admission medications   Medication Sig Start Date End Date Taking? Authorizing Provider  carvedilol (COREG) 6.25 MG tablet Take 1 tablet (6.25 mg total) by mouth 2 (two) times daily with a meal. 08/02/20   Larey Dresser, MD  cetirizine (ZYRTEC) 10 MG tablet Take 10 mg by mouth daily.    [provider]  ezetimibe (ZETIA) 10 MG tablet Take 1 tablet (10 mg total) by mouth daily. 04/04/21   Minna Merritts, MD  fenofibrate (TRICOR) 145 MG tablet TAKE 1 TABLET BY MOUTH EVERY DAY 05/29/21   [name] A, FNP  Insulin Glargine (BASAGLAR KWIKPEN) 100 UNIT/ML Inject 28 Units into the skin at bedtime. 05/23/21   Jennye Boroughs, MD  insulin lispro (HUMALOG) 100 UNIT/ML KwikPen Inject 3 Units into the skin 3 (three) times daily.    [provider]  Ipratropium-Albuterol (COMBIVENT RESPIMAT) 20-100 MCG/ACT AERS respimat Inhale 1 puff into the lungs every 6 (six) hours as needed for wheezing.    [provider]  metolazone (ZAROXOLYN) 5 MG tablet TAKE 1 TABLET (5 MG TOTAL) BY MOUTH DAILY AS NEEDED (FOR WEIGHT OVER 245).  05/29/21   Alisa Graff, FNP  nitroGLYCERIN (NITROSTAT) 0.4 MG SL tablet Place 1 tablet (0.4 mg total) under the tongue every 5 (five) minutes x 3 doses as needed for chest pain. 06/09/20   Lorella Nimrod, MD  potassium chloride SA (KLOR-CON M20) 20 MEQ tablet Take 1 tablet daily 05/29/21   Alisa Graff, FNP  rivaroxaban (XARELTO) 20 MG TABS tablet Take 1 tablet (20 mg total) by mouth daily with supper. 11/07/20   Cammie Sickle, MD  rosuvastatin (CRESTOR) 20 MG tablet TAKE 1 TABLET BY MOUTH EVERYDAY AT BEDTIME 05/29/21   Hackney, Tina A, FNP  sacubitril-valsartan (ENTRESTO) 49-51 MG Take 0.5 tablets by mouth 2 (two) times daily.    [provider]  torsemide (DEMADEX) 20 MG tablet TAKE 2 TABLETS BY MOUTH TWICE A DAY (TAKE AN EXTRA 2 TABLETS AS NEED FOR SHORTNESS OF  BREATH) 05/29/21   Alisa Graff, FNP    Allergies Metformin and related, Prednisone, and Zantac [ranitidine hcl]  Family History  Problem Relation Age of Onset   Diabetes Mother    Diabetes Mellitus II Mother    Hypothyroidism Mother    Hypertension Mother    Kidney failure Mother        Dialysis   Heart attack Mother        11 yo approximately   Hypertension Father    Gout Father    Cancer Maternal Grandfather    Diabetes Maternal Grandfather    Cancer Paternal Aunt     Social History Social History   Tobacco Use   Smoking status: Former    Packs/day: 0.50    Years: 33.00    Pack years: 16.50    Types: Cigarettes    Quit date: 05/08/2020    Years since quitting: 1.2   Smokeless tobacco: Former    Quit date: 05/08/2020   Tobacco comments:    Quit Sept 2021  Vaping Use   Vaping Use: Former  Substance Use Topics   Alcohol use: Yes    Alcohol/week: 0.0 standard drinks    Comment: occassionally   Drug use: No    Review of Systems  Constitutional: No fever/chills Eyes: No visual changes. ENT: No sore throat. Cardiovascular: Positive for chest pain. Respiratory: Positive for hemoptysis and shortness of breath. Gastrointestinal: No abdominal pain.  No nausea, no vomiting.  No diarrhea.  No constipation. Genitourinary: Negative for dysuria. Musculoskeletal: Negative for back pain. Skin: Negative for rash. Neurological: Negative for headaches, focal weakness or numbness. ____________________________________________   PHYSICAL EXAM:  VITAL SIGNS: Vitals:   08/09/21 1041 08/09/21 1330  BP: 124/78 116/72  Pulse: 86 77  Resp: 20 (!) 22  Temp: 98 F (36.7 C)   SpO2: 96% 95%    Constitutional: Alert and oriented.  Huffing and puffing.  Sitting up in bed and obviously dyspneic just with minimal conversation. Eyes: Conjunctivae are normal. PERRL. EOMI. Head: Atraumatic. Nose: No congestion/rhinnorhea. Mouth/Throat: Mucous membranes are moist.  Oropharynx  non-erythematous. Neck: No stridor. No cervical spine tenderness to palpation. Cardiovascular: Normal rate, regular rhythm. Grossly normal heart sounds.  Good peripheral circulation. Respiratory: Tachypneic with clear lungs. Gastrointestinal: Soft , nondistended, nontender to palpation. No CVA tenderness. Musculoskeletal: No lower extremity tenderness nor edema.  No joint effusions. No signs of acute trauma. Neurologic:  Normal speech and language. No gross focal neurologic deficits are appreciated. No gait instability noted. Skin:  Skin is warm, dry and intact. No rash noted. Psychiatric: Mood and affect are normal.  Speech and behavior are normal. ____________________________________________   LABS (all labs ordered are listed, but only abnormal results are displayed)  Labs Reviewed  BASIC METABOLIC PANEL - Abnormal; Notable for the following components:      Result Value   Sodium 134 (*)    Chloride 97 (*)    Glucose, Bld 381 (*)    BUN 30 (*)    Creatinine, Ser 1.37 (*)    All other components within normal limits  CBC - Abnormal; Notable for the following components:   RBC 6.26 (*)    Hemoglobin 17.7 (*)    All other components within normal limits  BRAIN NATRIURETIC PEPTIDE - Abnormal; Notable for the following components:   B Natriuretic Peptide 487.8 (*)    All other components within normal limits  TROPONIN I (HIGH SENSITIVITY) - Abnormal; Notable for the following components:   Troponin I (High Sensitivity) 54 (*)    All other components within normal limits  TROPONIN I (HIGH SENSITIVITY) - Abnormal; Notable for the following components:   Troponin I (High Sensitivity) 46 (*)    All other components within normal limits  RESP PANEL BY RT-PCR (FLU A&B, COVID) [id number]   ____________________________________________  12 Lead EKG  Sinus rhythm with a rate of 89 bpm.  1 PVC.  Rightward axis.  Slightly prolonged QTC at 501 ms.  Inferior T wave inversions without  STEMI. ____________________________________________  RADIOLOGY  ED MD interpretation:  CXR reviewed by me without evidence of acute cardiopulmonary pathology.  Official radiology report(s): DG Chest 2 View  Result Date: 08/09/2021 CLINICAL DATA:  Short of breath EXAM: CHEST - 2 VIEW COMPARISON:  05/20/2021 FINDINGS: The heart size and mediastinal contours are within normal limits. Both lungs are clear. The visualized skeletal structures are unremarkable. IMPRESSION: No active cardiopulmonary disease. Electronically Signed   By: Franchot Gallo M.D.   On: 08/09/2021 11:05   CT Angio Chest PE W/Cm &/Or Wo Cm  Result Date: 08/09/2021 CLINICAL DATA:  Pulmonary embolism suspected. High probability. Bloody sputum. EXAM: CT ANGIOGRAPHY CHEST WITH CONTRAST TECHNIQUE: Multidetector CT imaging of the chest was performed using the standard protocol during bolus administration of intravenous contrast. Multiplanar CT image reconstructions and MIPs were obtained to evaluate the vascular anatomy. CONTRAST:  28mL OMNIPAQUE IOHEXOL 350 MG/ML SOLN COMPARISON:  None. FINDINGS: Cardiovascular: No filling defects within the pulmonary arteries to suggest acute pulmonary embolism. Mediastinum/Nodes: No significant vascular findings. Normal heart size. No pericardial effusion. Lungs/Pleura: No suspicious pulmonary nodules. Normal pleural. Airways normal. Diffuse ground-glass opacities. No pulmonary infarction. No pneumonia. Upper Abdomen: Limited view of the liver, kidneys, pancreas are unremarkable. Normal adrenal glands. Musculoskeletal: No aggressive osseous lesion. Review of the MIP images confirms the above findings. IMPRESSION: 1. No evidence acute pulmonary embolism. 2. Diffuse ground-glass opacities suggest mild pulmonary edema. Electronically Signed   By: Suzy Bouchard M.D.   On: 08/09/2021 14:12    ____________________________________________   PROCEDURES and INTERVENTIONS  Procedure(s) performed  (including Critical Care):  .1-3 Lead EKG Interpretation Performed by: Vladimir Crofts, MD Authorized by: Vladimir Crofts, MD     Interpretation: normal     ECG rate:  76   ECG rate assessment: normal     Rhythm: sinus rhythm     Ectopy: none     Conduction: normal    Medications  furosemide (LASIX) injection 60 mg (has no administration in time range)  iohexol (OMNIPAQUE) 350 MG/ML injection 75 mL (75 mLs Intravenous Contrast Given 08/09/21 1354)  ____________________________________________   MDM / ED COURSE   50 year old male presents to the ED short of breath with evidence of a CHF exacerbation requiring medical admission.  He is dyspneic and tachypneic, but no instability or hypoxia.  Troponin is marginally elevated and no STEMI on EKG.  CTA chest obtained due to concerns for PE considering his lack of anticoagulation this week, and thankfully demonstrates no evidence of clot.  Does show stigmata of CHF exacerbation, which I suspect is his underlying pathology.  Reports compliance with his Xarelto at home.  Due to his significant tachypnea and dyspnea, I think he would benefit from medical observation admission.  We will initiate diuresis here in the ED and discuss with medicine.  Clinical Course as of 08/09/21 1421  Thu Aug 09, 2021  1405 Patient headed to [place].  I discussed with him my concerns for an acute PE.  Updated the nurse. [DS]    Clinical Course User Index [DS] Vladimir Crofts, MD    ____________________________________________   FINAL CLINICAL IMPRESSION(S) / ED DIAGNOSES  Final diagnoses:  Shortness of breath  Acute on chronic systolic congestive heart failure [facility])     ED Discharge Orders     None        Carisa Backhaus   Note:  This document was prepared using Dragon voice recognition software and may include unintentional dictation errors.    Vladimir Crofts, MD 08/09/21 1422    Vladimir Crofts, MD 08/09/21 [phone]

## 2021-08-09 NOTE — ED Notes (Signed)
Messaged pharmacy that hycosamine has not arrived yet. Will tube again

## 2021-08-09 NOTE — ED Triage Notes (Signed)
Pt c/o cough with SOB for the past 3 months, states he had blood in his sputum the other day, states he will having worsening sx for about 3 days as soon as he goes back to work it comes back. Pt is in NAD at present

## 2021-08-09 NOTE — H&P (Addendum)
History and Physical:    Gabriel [name].   EZM:[id number] DOB: 1971-08-07 DOA: 08/09/2021  Referring MD/provider: Vladimir Crofts, MD PCP: [name], FNP   Patient coming from: Home  Chief Complaint: Shortness of breath  History of Present Illness:   Gabriel [name]. is a 50 y.o. 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  He presented to the hospital because of cough, worsening shortness of breath associated with wheezing.  He also reported that he sneezed, coughed up some blood and had some nosebleed about 3 days ago.  However, he has not noticed any bleeding since then. No leg swelling, leg pain, chest pain, vomiting, diarrhea, abdominal pain or fever.  He has noticed increased frequency of micturition, mainly in length, in the past few days.   ED Course:  The patient was tachypneic in the ED but otherwise vital signs were stable and he was tolerating room air.  He was given IV Lasix 60 mg x 1 dose.  ROS:   ROS all other systems reviewed were negative  Past Medical History:   Past Medical History:  Diagnosis Date   CAD (coronary artery disease)    a. 04/2015 low risk MV;  b. 12/2016 Cath: minor irregs in LAD/Diag/LCX/OM, RCA 40p/m/d; c. 05/2020 Cath: LM nl, LAD 50d, LCX 30p, OM1 40, RCA 100p w/ L->L collats. CO/CI 3.1/1.3-->Med rx.   Chest wall pain, chronic    CHF (congestive heart failure) (HCC)    Chronic Troponin Elevation    CKD (chronic kidney disease), stage II-III    COPD (chronic obstructive pulmonary  disease) (HCC)    Diabetes mellitus without complication (HCC)    HFrEF (heart failure with reduced ejection fraction) ([place])    Hypertension    Mitral regurgitation    Mild to moderate by October 2021 echocardiogram.   Mixed Ischemic & NICM (nonischemic cardiomyopathy) ([place])    a. 03/2015 Echo: EF 45-50%; b. 12/2015 Echo: EF 20-25%; c. 02/2016 Echo: EF 30-35%; d. 11/2016 Echo: EF 40-45%; e. 06/2019 Echo: EF 30-35%; f. 11/2019 Echo: EF 25-30%; g. 05/2020 Echo: EF 35-40%, glob HK, nl RV size/fxn, PASP 44.65mmHg, mod dil LA. Mild to mod MR.   Myocardial infarct (HCC)    NSVT (nonsustained ventricular tachycardia)    a. 12/2015 noted on tele-->amio;  b. 12/2015 Event monitor: no VT noted.   Obesity (BMI 30.0-34.9)    Psoriasis    Recurrent pulmonary emboli ([place]) 06/07/2020   06/07/20: small bilateral PEs.  12/31/19: RUL and RLL PEs.   Syncope    a. 01/2016 - felt to be vasovagal.    Past Surgical History:   Past Surgical History:  Procedure Laterality Date   AMPUTATION     CARDIAC CATHETERIZATION     FINGER AMPUTATION     Traumatic   FINGER FRACTURE SURGERY Left    LEFT HEART CATH AND CORONARY ANGIOGRAPHY N/A 01/06/2017   Procedure: Left Heart Cath and Coronary Angiography;  Surgeon: Wellington Hampshire, MD;  Location: [facility];  Service: Cardiovascular;  Laterality: N/A;   RIGHT/LEFT HEART CATH AND CORONARY ANGIOGRAPHY N/A 06/13/2020   Procedure: RIGHT/LEFT HEART  CATH AND CORONARY ANGIOGRAPHY;  Surgeon: Nelva Bush, MD;  Location: [facility];  Service: Cardiovascular;  Laterality: N/A;    Social History:   Social History   Socioeconomic History   Marital status: Widowed    Spouse name: Not on file   Number of children: 1   Years of education: 57   Highest education level: 12th grade  Occupational History   Occupation: disabled  Tobacco Use   Smoking status: Former    Packs/day: 0.50    Years: 33.00    Pack years: 16.50    Types: Cigarettes    Quit date:  05/08/2020    Years since quitting: 1.2   Smokeless tobacco: Former    Quit date: 05/08/2020   Tobacco comments:    Quit Sept 2021  Vaping Use   Vaping Use: Former  Substance and Sexual Activity   Alcohol use: Yes    Alcohol/week: 0.0 standard drinks    Comment: occassionally   Drug use: No   Sexual activity: Not Currently  Other Topics Concern   Not on file  Social History Narrative   Lives at home with wife and his dad's wife.        Works sales/advanced Academic librarian; smoker; cutting; hx of alcoholism [started 37 year]; quit at age of 31 years.    Social Determinants of Health   Financial Resource Strain: Not on file  Food Insecurity: Not on file  Transportation Needs: Not on file  Physical Activity: Not on file  Stress: Not on file  Social Connections: Not on file  Intimate Partner Violence: Not on file    Allergies   Metformin and related, Prednisone, and Zantac [ranitidine hcl]  Family history:   Family History  Problem Relation Age of Onset   Diabetes Mother    Diabetes Mellitus II Mother    Hypothyroidism Mother    Hypertension Mother    Kidney failure Mother        Dialysis   Heart attack Mother        79 yo approximately   Hypertension Father    Gout Father    Cancer Maternal Grandfather    Diabetes Maternal Grandfather    Cancer Paternal Aunt     Current Medications:   Prior to Admission medications   Medication Sig Start Date End Date Taking? Authorizing Provider  carvedilol (COREG) 6.25 MG tablet Take 1 tablet (6.25 mg total) by mouth 2 (two) times daily with a meal. 08/02/20   Larey Dresser, MD  cetirizine (ZYRTEC) 10 MG tablet Take 10 mg by mouth daily.    [provider]  ezetimibe (ZETIA) 10 MG tablet Take 1 tablet (10 mg total) by mouth daily. 04/04/21   Minna Merritts, MD  fenofibrate (TRICOR) 145 MG tablet TAKE 1 TABLET BY MOUTH EVERY DAY 05/29/21   [name] A, FNP  Insulin Glargine (BASAGLAR KWIKPEN) 100 UNIT/ML Inject 28  Units into the skin at bedtime. 05/23/21   Jennye Boroughs, MD  insulin lispro (HUMALOG) 100 UNIT/ML KwikPen Inject 3 Units into the skin 3 (three) times daily.    [provider]  Ipratropium-Albuterol (COMBIVENT RESPIMAT) 20-100 MCG/ACT AERS respimat Inhale 1 puff into the lungs every 6 (six) hours as needed for wheezing.    [provider]  metolazone (ZAROXOLYN) 5 MG tablet TAKE 1 TABLET (5 MG TOTAL) BY MOUTH DAILY AS NEEDED (FOR WEIGHT OVER 245). 05/29/21   Alisa Graff, FNP  nitroGLYCERIN (NITROSTAT) 0.4 MG SL tablet  Place 1 tablet (0.4 mg total) under the tongue every 5 (five) minutes x 3 doses as needed for chest pain. 06/09/20   Lorella Nimrod, MD  potassium chloride SA (KLOR-CON M20) 20 MEQ tablet Take 1 tablet daily 05/29/21   Alisa Graff, FNP  rivaroxaban (XARELTO) 20 MG TABS tablet Take 1 tablet (20 mg total) by mouth daily with supper. 11/07/20   Cammie Sickle, MD  rosuvastatin (CRESTOR) 20 MG tablet TAKE 1 TABLET BY MOUTH EVERYDAY AT BEDTIME 05/29/21   Hackney, Tina A, FNP  sacubitril-valsartan (ENTRESTO) 49-51 MG Take 0.5 tablets by mouth 2 (two) times daily.    [provider]  torsemide (DEMADEX) 20 MG tablet TAKE 2 TABLETS BY MOUTH TWICE A DAY (TAKE AN EXTRA 2 TABLETS AS NEED FOR SHORTNESS OF BREATH) 05/29/21   Alisa Graff, FNP    Physical Exam:   Vitals:   08/09/21 1330 08/09/21 1405 08/09/21 1430 08/09/21 1500  BP: 116/72 (!) 147/76 107/68 (!) 145/91  Pulse: 77 76 86 88  Resp: (!) 22 16 (!) 32 (!) 24  Temp:      TempSrc:      SpO2: 95% 93% 97% 97%  Weight:      Height:         Physical Exam: Blood pressure (!) 145/91, pulse 88, temperature 98 F (36.7 C), temperature source Oral, resp. rate (!) 24, height 6' (1.829 m), weight 106.6 kg, SpO2 97 %. Gen: Mild respiratory distress Head: Normocephalic, atraumatic. Eyes: Pupils equal, round and reactive to light. Extraocular movements intact.  Sclerae nonicteric.  Mouth: Moist  mucous membranes Neck: Supple, no thyromegaly, no lymphadenopathy, no jugular venous distention. Chest: Lungs are clear to auscultation with good air movement. No rales, rhonchi or wheezes.  CV: Heart sounds are regular with an S1, S2. No murmurs, rubs or gallops.  Abdomen: Soft, nontender, nondistended with normal active bowel sounds. No palpable masses. Extremities: Extremities are without clubbing, or cyanosis. No edema. Pedal pulses 2+.  Skin: Warm and dry. No rashes, lesions or wounds Neuro: Alert and oriented times 3; grossly nonfocal.  Psych: Insight is good and judgment is appropriate. Mood and affect normal.   Data Review:    Labs: Basic Metabolic Panel: Recent Labs  Lab 08/09/21 1046  NA 134*  K 3.5  CL 97*  CO2 25  GLUCOSE 381*  BUN 30*  CREATININE 1.37*  CALCIUM 9.8   Liver Function Tests: No results for input(s): AST, ALT, ALKPHOS, BILITOT, PROT, ALBUMIN in the last 168 hours. No results for input(s): LIPASE, AMYLASE in the last 168 hours. No results for input(s): AMMONIA in the last 168 hours. CBC: Recent Labs  Lab 08/09/21 1046  WBC 9.3  HGB 17.7*  HCT 50.9  MCV 81.3  PLT 220   Cardiac Enzymes: No results for input(s): CKTOTAL, CKMB, CKMBINDEX, TROPONINI in the last 168 hours.  BNP (last 3 results) No results for input(s): PROBNP in the last 8760 hours. CBG: No results for input(s): GLUCAP in the last 168 hours.  Urinalysis    Component Value Date/Time   COLORURINE STRAW (A) 02/19/2021 0838   APPEARANCEUR CLEAR (A) 02/19/2021 0838   APPEARANCEUR Clear 03/08/2020 0951   LABSPEC 1.009 02/19/2021 0838   LABSPEC 1.019 11/12/2012 1713   PHURINE 5.0 02/19/2021 0838   GLUCOSEU >=500 (A) 02/19/2021 0838   GLUCOSEU Negative 11/12/2012 1713   HGBUR SMALL (A) 02/19/2021 0838   BILIRUBINUR NEGATIVE 02/19/2021 [place] Negative 03/08/2020 0951  BILIRUBINUR Negative 11/12/2012 1713   KETONESUR NEGATIVE 02/19/2021 0838   PROTEINUR 30 (A)  02/19/2021 0838   NITRITE NEGATIVE 02/19/2021 0838   LEUKOCYTESUR NEGATIVE 02/19/2021 0838   LEUKOCYTESUR Negative 11/12/2012 1713      Radiographic Studies: DG Chest 2 View  Result Date: 08/09/2021 CLINICAL DATA:  Short of breath EXAM: CHEST - 2 VIEW COMPARISON:  05/20/2021 FINDINGS: The heart size and mediastinal contours are within normal limits. Both lungs are clear. The visualized skeletal structures are unremarkable. IMPRESSION: No active cardiopulmonary disease. Electronically Signed   By: Franchot Gallo M.D.   On: 08/09/2021 11:05   CT Angio Chest PE W/Cm &/Or Wo Cm  Result Date: 08/09/2021 CLINICAL DATA:  Pulmonary embolism suspected. High probability. Bloody sputum. EXAM: CT ANGIOGRAPHY CHEST WITH CONTRAST TECHNIQUE: Multidetector CT imaging of the chest was performed using the standard protocol during bolus administration of intravenous contrast. Multiplanar CT image reconstructions and MIPs were obtained to evaluate the vascular anatomy. CONTRAST:  10mL OMNIPAQUE IOHEXOL 350 MG/ML SOLN COMPARISON:  None. FINDINGS: Cardiovascular: No filling defects within the pulmonary arteries to suggest acute pulmonary embolism. Mediastinum/Nodes: No significant vascular findings. Normal heart size. No pericardial effusion. Lungs/Pleura: No suspicious pulmonary nodules. Normal pleural. Airways normal. Diffuse ground-glass opacities. No pulmonary infarction. No pneumonia. Upper Abdomen: Limited view of the liver, kidneys, pancreas are unremarkable. Normal adrenal glands. Musculoskeletal: No aggressive osseous lesion. Review of the MIP images confirms the above findings. IMPRESSION: 1. No evidence acute pulmonary embolism. 2. Diffuse ground-glass opacities suggest mild pulmonary edema. Electronically Signed   By: Suzy Bouchard M.D.   On: 08/09/2021 14:12    EKG: Independently reviewed by me.  It showed normal sinus rhythm, PACs, PVCs, prolonged QTC at 501   Assessment/Plan:   Principal  Problem:   Acute on chronic systolic (congestive) heart failure (HCC) Active Problems:   Acute on chronic combined systolic (congestive) and diastolic (congestive) heart failure (HCC)    Body mass index is 31.87 kg/m.  (Obesity)  Acute exacerbation of chronic systolic and diastolic CHF: Admit to progressive cardiac unit for observation.  Treat with IV Lasix.  Continue carvedilol.  Monitor BMP, daily weight and urine output.  2D echo in October 2022 showed EF estimated at 40 to 45%, mild LVH and normal LV diastolic parameters.  Hold torsemide, metolazone and Entresto.  CTA was negative for pulmonary embolism.  Type II DM with hyperglycemia: Continue Basaglar 28 units nightly and NovoLog 3 units 3 times daily.  CAD, history of DVT and PE: Resume Xarelto  OSA: Use CPAP at night  Other information:   DVT prophylaxis:  rivaroxaban (XARELTO) tablet 20 mg  Code Status: Full code. Family Communication: None  Disposition Plan: Possible discharge to home in 1 to 2 days Consults called: None Admission status: Observation  The medical decision making on this patient was of high complexity and the patient is at high risk for clinical deterioration, therefore this is a level 3 visit.    Amire Gossen [facility] Pager: Please check [email or web address]   How to contact the [facility] Attending or Consulting provider [place] or covering provider during after hours [place], for this patient?   Check the care team in [facility] and look for a) attending/consulting [facility] provider listed and b) the [facility] team listed Log into [email or web address] and use [place]'s universal password to access. If you do not have the password, please contact the hospital operator. Locate the [facility] provider you are looking for under [facility]  and page to a number that you can be directly reached. If you still have difficulty reaching the provider, please page the [facility] (Director on Call) for the Hospitalists listed on amion for  assistance.  08/09/2021, 3:28 PM

## 2021-08-09 NOTE — ED Notes (Signed)
Messaged pharm about robaxin still not here

## 2021-08-09 NOTE — ED Notes (Signed)
Call pharmacy regarding hyoscyamine.  Sts they will attempt to send it again.

## 2021-08-09 NOTE — ED Notes (Signed)
Pt given a warm pack to place on upper abdomen.

## 2021-08-09 NOTE — ED Notes (Signed)
Pt given turkey sandwich and milk.

## 2021-08-09 NOTE — ED Notes (Addendum)
Pt c/o severe RUQ pain.  Sts it feels like a spasm.  EDP feels it is from the Lasix.  Vitals remain stable.  Hospitalist notified.

## 2021-08-10 DIAGNOSIS — R0600 Dyspnea, unspecified: Secondary | ICD-10-CM | POA: Diagnosis present

## 2021-08-10 DIAGNOSIS — J449 Chronic obstructive pulmonary disease, unspecified: Secondary | ICD-10-CM | POA: Diagnosis not present

## 2021-08-10 DIAGNOSIS — I130 Hypertensive heart and chronic kidney disease with heart failure and stage 1 through stage 4 chronic kidney disease, or unspecified chronic kidney disease: Secondary | ICD-10-CM | POA: Diagnosis not present

## 2021-08-10 DIAGNOSIS — I252 Old myocardial infarction: Secondary | ICD-10-CM | POA: Diagnosis not present

## 2021-08-10 DIAGNOSIS — I340 Nonrheumatic mitral (valve) insufficiency: Secondary | ICD-10-CM | POA: Diagnosis not present

## 2021-08-10 DIAGNOSIS — R918 Other nonspecific abnormal finding of lung field: Secondary | ICD-10-CM | POA: Diagnosis not present

## 2021-08-10 DIAGNOSIS — E1122 Type 2 diabetes mellitus with diabetic chronic kidney disease: Secondary | ICD-10-CM | POA: Diagnosis not present

## 2021-08-10 DIAGNOSIS — I5043 Acute on chronic combined systolic (congestive) and diastolic (congestive) heart failure: Secondary | ICD-10-CM | POA: Diagnosis not present

## 2021-08-10 DIAGNOSIS — R042 Hemoptysis: Secondary | ICD-10-CM | POA: Diagnosis not present

## 2021-08-10 DIAGNOSIS — N182 Chronic kidney disease, stage 2 (mild): Secondary | ICD-10-CM | POA: Diagnosis present

## 2021-08-10 DIAGNOSIS — Z7901 Long term (current) use of anticoagulants: Secondary | ICD-10-CM | POA: Diagnosis not present

## 2021-08-10 DIAGNOSIS — Z79899 Other long term (current) drug therapy: Secondary | ICD-10-CM | POA: Diagnosis not present

## 2021-08-10 DIAGNOSIS — I472 Ventricular tachycardia, unspecified: Secondary | ICD-10-CM | POA: Diagnosis not present

## 2021-08-10 DIAGNOSIS — Z20822 Contact with and (suspected) exposure to covid-19: Secondary | ICD-10-CM | POA: Diagnosis not present

## 2021-08-10 DIAGNOSIS — I428 Other cardiomyopathies: Secondary | ICD-10-CM | POA: Diagnosis not present

## 2021-08-10 DIAGNOSIS — Z87891 Personal history of nicotine dependence: Secondary | ICD-10-CM | POA: Diagnosis not present

## 2021-08-10 DIAGNOSIS — E1165 Type 2 diabetes mellitus with hyperglycemia: Secondary | ICD-10-CM | POA: Diagnosis not present

## 2021-08-10 DIAGNOSIS — N179 Acute kidney failure, unspecified: Secondary | ICD-10-CM | POA: Diagnosis not present

## 2021-08-10 DIAGNOSIS — Z8249 Family history of ischemic heart disease and other diseases of the circulatory system: Secondary | ICD-10-CM | POA: Diagnosis not present

## 2021-08-10 DIAGNOSIS — I5023 Acute on chronic systolic (congestive) heart failure: Secondary | ICD-10-CM | POA: Diagnosis not present

## 2021-08-10 DIAGNOSIS — R0602 Shortness of breath: Secondary | ICD-10-CM | POA: Diagnosis not present

## 2021-08-10 DIAGNOSIS — G4733 Obstructive sleep apnea (adult) (pediatric): Secondary | ICD-10-CM | POA: Diagnosis not present

## 2021-08-10 DIAGNOSIS — E669 Obesity, unspecified: Secondary | ICD-10-CM | POA: Diagnosis not present

## 2021-08-10 DIAGNOSIS — Z833 Family history of diabetes mellitus: Secondary | ICD-10-CM | POA: Diagnosis not present

## 2021-08-10 DIAGNOSIS — I2510 Atherosclerotic heart disease of native coronary artery without angina pectoris: Secondary | ICD-10-CM | POA: Diagnosis not present

## 2021-08-10 DIAGNOSIS — Z6831 Body mass index (BMI) 31.0-31.9, adult: Secondary | ICD-10-CM | POA: Diagnosis not present

## 2021-08-10 DIAGNOSIS — Z86711 Personal history of pulmonary embolism: Secondary | ICD-10-CM | POA: Diagnosis not present

## 2021-08-10 DIAGNOSIS — Z86718 Personal history of other venous thrombosis and embolism: Secondary | ICD-10-CM | POA: Diagnosis not present

## 2021-08-10 DIAGNOSIS — Z794 Long term (current) use of insulin: Secondary | ICD-10-CM | POA: Diagnosis not present

## 2021-08-10 LAB — CBG MONITORING, ED
Glucose-Capillary: 263 mg/dL — ABNORMAL HIGH (ref 70–99)
Glucose-Capillary: 323 mg/dL — ABNORMAL HIGH (ref 70–99)

## 2021-08-10 LAB — BASIC METABOLIC PANEL
Anion gap: 9 (ref 5–15)
BUN: 35 mg/dL — ABNORMAL HIGH (ref 6–20)
CO2: 33 mmol/L — ABNORMAL HIGH (ref 22–32)
Calcium: 10 mg/dL (ref 8.9–10.3)
Chloride: 95 mmol/L — ABNORMAL LOW (ref 98–111)
Creatinine, Ser: 1.62 mg/dL — ABNORMAL HIGH (ref 0.61–1.24)
GFR, Estimated: 51 mL/min — ABNORMAL LOW (ref >60)
Glucose, Bld: 240 mg/dL — ABNORMAL HIGH (ref 70–99)
Potassium: 3.8 mmol/L (ref 3.5–5.1)
Sodium: 137 mmol/L (ref 135–145)

## 2021-08-10 LAB — GLUCOSE, CAPILLARY: Glucose-Capillary: 348 mg/dL — ABNORMAL HIGH (ref 70–99)

## 2021-08-10 MED ORDER — INSULIN ASPART 100 UNIT/ML IJ SOLN
0.0000 [IU] | Freq: Three times a day (TID) | INTRAMUSCULAR | Status: DC
  Administered 2021-08-10: 16:00:00 11 [IU] via SUBCUTANEOUS
  Administered 2021-08-11 (×3): 8 [IU] via SUBCUTANEOUS
  Administered 2021-08-12: 13:00:00 5 [IU] via SUBCUTANEOUS
  Administered 2021-08-12: 09:00:00 3 [IU] via SUBCUTANEOUS
  Administered 2021-08-12: 18:00:00 15 [IU] via SUBCUTANEOUS
  Filled 2021-08-10 (×6): qty 1

## 2021-08-10 MED ORDER — INSULIN GLARGINE-YFGN 100 UNIT/ML ~~LOC~~ SOLN
32.0000 [IU] | Freq: Every day | SUBCUTANEOUS | Status: DC
  Administered 2021-08-10 – 2021-08-12 (×3): 32 [IU] via SUBCUTANEOUS
  Filled 2021-08-10 (×5): qty 0.32

## 2021-08-10 NOTE — Progress Notes (Signed)
Inpatient Diabetes Program Recommendations  AACE/ADA: New Consensus Statement on Inpatient Glycemic Control (2015)  Target Ranges:  Prepandial:   less than 140 mg/dL      Peak postprandial:   less than 180 mg/dL (1-2 hours)      Critically ill patients:  140 - 180 mg/dL   Lab Results  Component Value Date   GLUCAP 263 (H) 08/10/2021   HGBA1C 8.1 (H) 03/28/2021    Review of Glycemic Control  Diabetes history: DM 2 Outpatient Diabetes medications: Basaglar 28 units Humalog 3 units tid with meals Current orders for Inpatient glycemic control:  Semglee 28 units Novolog 3 units tid with meals  Inpatient Diabetes Program Recommendations:    Correction scale not ordered -  Increase Semglee to 32 units -  Add Novolog 0-9 units tid + hs  Thanks,  Tama Headings RN, MSN, BC-ADM Inpatient Diabetes Coordinator Team Pager [phone] (8a-5p)

## 2021-08-10 NOTE — Progress Notes (Signed)
PROGRESS NOTE    Gabriel [name].  WPY:[id number] DOB: May 04, 1971 DOA: 08/09/2021 PCP: [name], FNP  129A/129A-AA   Assessment & Plan:   Principal Problem:   Acute on chronic systolic (congestive) heart failure (HCC) Active Problems:   Acute on chronic combined systolic (congestive) and diastolic (congestive) heart failure (HCC)   Dyspnea   Gabriel [name]. is a 50 y.o. 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  He presented to the hospital because of cough, worsening shortness of breath associated with wheezing.    Acute exacerbation of chronic systolic and diastolic CHF:  --takes home torsemide 40 mg BID and metolazone at home. 2D echo in October 2022 showed EF estimated at 40 to 45%, mild LVH and normal LV diastolic parameters.   --received IV Lasix 60 mg x 3 doses so far, with Cr trending up. Plan: --hold diuretic today due to Cr increase --cont coreg --hold home Entresto due to AKI  Type II DM with hyperglycemia:  --increase glargine to 32u nightly --mealtime 3u TID --SSI TID   CAD  AKI --Cr 1.37 on presentation, trended up to 1.62 this morning.  Cr 1.26 in Oct 2022. Plan: --hold diuresis today --hold home Entresto  history of DVT and PE:  --cont home Xarelto   OSA on home CPAP  --cont CPAP nightly  Obesity, BMI 31.72   DVT prophylaxis: [id number]  Code Status: Full code  Family Communication:  Level of care: Med-Surg Dispo:   The patient is from: home Anticipated d/c is to:  home Anticipated d/c date is: 2-3 days Patient currently is not medically ready to d/c due to: IV lasix   Subjective and Interval History:  Pt reported breathing better.  Good urine output.     Objective: Vitals:   08/11/21 0618 08/11/21 0845 08/11/21 1101 08/11/21 1449  BP: 133/84 139/90 128/73 133/77  Pulse: 64 71 72 71  Resp: 16 14 18 16   Temp: 97.6 F (36.4 C) 97.8 F (36.6 C) 98 F (36.7 C) (!) 97.4 F (36.3 C)  TempSrc: Oral Oral Oral Oral  SpO2: 97% 97% 95% 95%  Weight:      Height:        Intake/Output Summary (Last 24 hours) at 08/11/2021 1847 Last data filed at 08/11/2021 1600 Gross per 24 hour  Intake 480 ml  Output 1300 ml  Net -820 ml   Filed Weights   08/09/21 1044 08/10/21 0845  Weight: 106.6 kg 106.1 kg    Examination:   Constitutional: NAD, AAOx3 HEENT: conjunctivae and lids normal, EOMI CV: No cyanosis.   RESP: normal respiratory effort, no wheezes, on RA Extremities: No effusions, edema in BLE SKIN: warm, dry Neuro: II - XII grossly intact.   Psych: Normal mood and affect.  Appropriate judgement and reason   Data Reviewed: I have personally reviewed following labs and imaging studies  CBC: Recent Labs  Lab 08/09/21 1046 08/11/21 0636  WBC 9.3 9.8  HGB 17.7* 16.4  HCT 50.9 47.8  MCV 81.3 82.3  PLT 220 193   Basic Metabolic Panel: Recent Labs  Lab 08/09/21 1046 08/09/21 2149 08/10/21 0729 08/11/21  0636  NA 134* 134* 137 138  K 3.5 3.3* 3.8 3.5  CL 97* 96* 95* 100  CO2 25 27 33* 28  GLUCOSE 381* 315* 240* 237*  BUN 30* 34* 35* 37*  CREATININE 1.37* 1.54* 1.62* 1.27*  CALCIUM 9.8 9.3 10.0 9.3  MG  --  1.6*  --  1.9   GFR: Estimated Creatinine Clearance: 85.7 mL/min (A) (by C-G formula based on SCr of 1.27 mg/dL (H)). Liver Function Tests: No results for input(s): AST, ALT, ALKPHOS, BILITOT, PROT, ALBUMIN in the last 168 hours. No results for input(s): LIPASE, AMYLASE in the last 168 hours. No results for input(s):  AMMONIA in the last 168 hours. Coagulation Profile: No results for input(s): INR, PROTIME in the last 168 hours. Cardiac Enzymes: No results for input(s): CKTOTAL, CKMB, CKMBINDEX, TROPONINI in the last 168 hours. BNP (last 3 results) No results for input(s): PROBNP in the last 8760 hours. HbA1C: No results for input(s): HGBA1C in the last 72 hours. CBG: Recent Labs  Lab 08/10/21 1617 08/10/21 2151 08/11/21 0937 08/11/21 1123 08/11/21 1733  GLUCAP 323* 348* 291* 285* 298*   Lipid Profile: No results for input(s): CHOL, HDL, LDLCALC, TRIG, CHOLHDL, LDLDIRECT in the last 72 hours. Thyroid Function Tests: No results for input(s): TSH, T4TOTAL, FREET4, T3FREE, THYROIDAB in the last 72 hours. Anemia Panel: No results for input(s): VITAMINB12, FOLATE, FERRITIN, TIBC, IRON, RETICCTPCT in the last 72 hours. Sepsis Labs: No results for input(s): PROCALCITON, LATICACIDVEN in the last 168 hours.  Recent Results (from the past 240 hour(s))  Resp Panel by RT-PCR (Flu A&B, Covid) Nasopharyngeal Swab     Status: None   Collection Time: 08/09/21  3:22 PM   Specimen: Nasopharyngeal Swab; Nasopharyngeal(NP) swabs in vial transport medium  Result Value Ref Range Status   SARS Coronavirus 2 by RT PCR NEGATIVE NEGATIVE Final    Comment: (NOTE) SARS-CoV-2 target nucleic acids are NOT DETECTED.  The SARS-CoV-2 RNA is generally detectable in upper respiratory specimens during the acute phase of infection. The lowest concentration of SARS-CoV-2 viral copies this assay can detect is 138 copies/mL. A negative result does not preclude SARS-Cov-2 infection and should not be used as the sole basis for treatment or other patient management decisions. A negative result may occur with  improper specimen collection/handling, submission of specimen other than nasopharyngeal swab, presence of viral mutation(s) within the areas targeted by this assay, and inadequate number of viral copies(<138 copies/mL). A  negative result must be combined with clinical observations, patient history, and epidemiological information. The expected result is Negative.  Fact Sheet for Patients:  EntrepreneurPulse.com.au  Fact Sheet for Healthcare Providers:  IncredibleEmployment.be  This test is no t yet approved or cleared by the [place] FDA and  has been authorized for detection and/or diagnosis of SARS-CoV-2 by FDA under an Emergency Use Authorization (EUA). This EUA will remain  in effect (meaning this test can be used) for the duration of the COVID-19 declaration under Section 564(b)(1) of the Act, 21 U.S.C.section 360bbb-3(b)(1), unless the authorization is terminated  or revoked sooner.       Influenza A by PCR NEGATIVE NEGATIVE Final   Influenza B by PCR NEGATIVE NEGATIVE Final    Comment: (NOTE) The Xpert Xpress SARS-CoV-2/FLU/RSV plus assay is intended as an aid in the diagnosis of influenza from Nasopharyngeal swab specimens and should not be used as a sole basis for treatment. Nasal washings and aspirates are unacceptable for Xpert Xpress SARS-CoV-2/FLU/RSV testing.  Fact Sheet  for Patients: EntrepreneurPulse.com.au  Fact Sheet for Healthcare Providers: IncredibleEmployment.be  This test is not yet approved or cleared by the [place] FDA and has been authorized for detection and/or diagnosis of SARS-CoV-2 by FDA under an Emergency Use Authorization (EUA). This EUA will remain in effect (meaning this test can be used) for the duration of the COVID-19 declaration under Section 564(b)(1) of the Act, 21 U.S.C. section 360bbb-3(b)(1), unless the authorization is terminated or revoked.  Performed at [facility], [address], [place], [place] [address]       Radiology Studies: No results found.   Scheduled Meds:  carvedilol  6.25 mg Oral BID WC   ezetimibe  10 mg Oral Daily   insulin  aspart  0-15 Units Subcutaneous TID WC   insulin aspart  3 Units Subcutaneous TID WC   insulin glargine-yfgn  32 Units Subcutaneous QHS   potassium chloride SA  20 mEq Oral BID   rivaroxaban  20 mg Oral Q supper   rosuvastatin  20 mg Oral QHS   sodium chloride flush  3 mL Intravenous Q12H   Continuous Infusions:  sodium chloride       LOS: 1 day     [name], MD [facility] If 7PM-7AM, please contact night-coverage 08/11/2021, 6:47 PM

## 2021-08-10 NOTE — ED Notes (Signed)
Pt in hospital bed and on RA

## 2021-08-11 DIAGNOSIS — I5023 Acute on chronic systolic (congestive) heart failure: Secondary | ICD-10-CM | POA: Diagnosis not present

## 2021-08-11 LAB — GLUCOSE, CAPILLARY
Glucose-Capillary: 291 mg/dL — ABNORMAL HIGH (ref 70–99)
Glucose-Capillary: 285 mg/dL — ABNORMAL HIGH (ref 70–99)
Glucose-Capillary: 298 mg/dL — ABNORMAL HIGH (ref 70–99)
Glucose-Capillary: 299 mg/dL — ABNORMAL HIGH (ref 70–99)

## 2021-08-11 LAB — CBC
HCT: 47.8 % (ref 39.0–52.0)
Hemoglobin: 16.4 g/dL (ref 13.0–17.0)
MCH: 28.2 pg (ref 26.0–34.0)
MCHC: 34.3 g/dL (ref 30.0–36.0)
MCV: 82.3 fL (ref 80.0–100.0)
Platelets: 187 K/uL (ref 150–400)
RBC: 5.81 MIL/uL (ref 4.22–5.81)
RDW: 13.9 % (ref 11.5–15.5)
WBC: 9.8 K/uL (ref 4.0–10.5)
nRBC: 0 % (ref 0.0–0.2)

## 2021-08-11 LAB — BASIC METABOLIC PANEL
Anion gap: 10 (ref 5–15)
BUN: 37 mg/dL — ABNORMAL HIGH (ref 6–20)
CO2: 28 mmol/L (ref 22–32)
Calcium: 9.3 mg/dL (ref 8.9–10.3)
Chloride: 100 mmol/L (ref 98–111)
Creatinine, Ser: 1.27 mg/dL — ABNORMAL HIGH (ref 0.61–1.24)
GFR, Estimated: >60 mL/min (ref >60)
Glucose, Bld: 237 mg/dL — ABNORMAL HIGH (ref 70–99)
Potassium: 3.5 mmol/L (ref 3.5–5.1)
Sodium: 138 mmol/L (ref 135–145)

## 2021-08-11 LAB — MAGNESIUM: Magnesium: 1.9 mg/dL (ref 1.7–2.4)

## 2021-08-11 MED ORDER — FUROSEMIDE 10 MG/ML IJ SOLN
60.0000 mg | Freq: Once | INTRAMUSCULAR | Status: AC
  Administered 2021-08-11: 13:00:00 60 mg via INTRAVENOUS
  Filled 2021-08-11: qty 6

## 2021-08-11 MED ORDER — INSULIN ASPART 100 UNIT/ML IJ SOLN
6.0000 [IU] | Freq: Three times a day (TID) | INTRAMUSCULAR | Status: DC
  Administered 2021-08-12 – 2021-08-13 (×5): 6 [IU] via SUBCUTANEOUS
  Filled 2021-08-11 (×5): qty 1

## 2021-08-11 NOTE — Progress Notes (Signed)
PROGRESS NOTE    Gabriel [name].  BSW:[id number] DOB: 02-07-1971 DOA: 08/09/2021 PCP: [name], FNP  129A/129A-AA   Assessment & Plan:   Principal Problem:   Acute on chronic systolic (congestive) heart failure (HCC) Active Problems:   Acute on chronic combined systolic (congestive) and diastolic (congestive) heart failure (HCC)   Dyspnea   Gabriel [name]. is a 50 y.o. 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  He presented to the hospital because of cough, worsening shortness of breath associated with wheezing.    Acute exacerbation of chronic systolic and diastolic CHF:  --takes home torsemide 40 mg BID and metolazone at home. 2D echo in October 2022 showed EF estimated at 40 to 45%, mild LVH and normal LV diastolic parameters.   --received IV Lasix 60 mg x 3 doses so far, with Cr trending up. Plan: --IV lasix 60 mg x1 today --cont coreg --hold home Entresto due to AKI  Type II DM with hyperglycemia:  --increase glargine to 32u nightly --increase mealtime to 6u TID --SSI TID   CAD  AKI --Cr 1.37 on presentation, trended up to 1.62 morning of 10/23.  Cr 1.26 in Oct 2022. Plan: --hold home Entresto  history of DVT and PE:  --cont home Xarelto   OSA on home CPAP  --cont CPAP nightly  Obesity, BMI 31.72  COPD with extensive smoking hx --does not appear to be in exacerbation.     DVT prophylaxis: [id number]  Code Status: Full code  Family Communication:  Level of care: Med-Surg Dispo:    The patient is from: home Anticipated d/c is to: home Anticipated d/c date is: 1-2 days Patient currently is not medically ready to d/c due to: IV lasix   Subjective and Interval History:  Pt reported feeling this mild tightness in his chest.  Overall, breathing was better.    Cr improved today.  IV lasix resumed.   Objective: Vitals:   08/11/21 0845 08/11/21 1101 08/11/21 1449 08/11/21 2158  BP: 139/90 128/73 133/77 131/79  Pulse: 71 72 71 71  Resp: 14 18 16 17   Temp: 97.8 F (36.6 C) 98 F (36.7 C) (!) 97.4 F (36.3 C) (!) 97.5 F (36.4 C)  TempSrc: Oral Oral Oral Oral  SpO2: 97% 95% 95% 97%  Weight:      Height:        Intake/Output Summary (Last 24 hours) at 08/11/2021 2206 Last data filed at 08/11/2021 1858 Gross per 24 hour  Intake 480 ml  Output 1750 ml  Net -1270 ml   Filed Weights   08/09/21 1044 08/10/21 0845  Weight: 106.6 kg 106.1 kg    Examination:   Constitutional: NAD, AAOx3 HEENT: conjunctivae and lids normal, EOMI CV: No cyanosis.   RESP: normal respiratory effort, on RA Extremities: No effusions, edema in BLE SKIN: warm, dry Neuro: II - XII grossly intact.   Psych: Normal mood and affect.  Appropriate judgement and reason   Data Reviewed: I have personally reviewed following labs and imaging studies  CBC: Recent Labs  Lab 08/09/21 1046 08/11/21 0636  WBC 9.3 9.8  HGB 17.7* 16.4  HCT 50.9 47.8  MCV 81.3 82.3  PLT 220 035   Basic Metabolic Panel: Recent Labs  Lab 08/09/21 1046 08/09/21 2149 08/10/21 0729 08/11/21 0636  NA 134* 134* 137 138  K 3.5 3.3* 3.8 3.5  CL 97* 96* 95* 100  CO2 25 27 33* 28  GLUCOSE 381* 315* 240* 237*  BUN 30* 34* 35* 37*  CREATININE 1.37* 1.54* 1.62* 1.27*  CALCIUM 9.8 9.3 10.0 9.3  MG  --  1.6*  --  1.9   GFR: Estimated Creatinine Clearance: 85.7 mL/min (A) (by C-G formula based on SCr of 1.27 mg/dL (H)). Liver Function Tests: No results for input(s): AST, ALT, ALKPHOS, BILITOT, PROT,  ALBUMIN in the last 168 hours. No results for input(s): LIPASE, AMYLASE in the last 168 hours. No results for input(s): AMMONIA in the last 168 hours. Coagulation Profile: No results for input(s): INR, PROTIME in the last 168 hours. Cardiac Enzymes: No results for input(s): CKTOTAL, CKMB, CKMBINDEX, TROPONINI in the last 168 hours. BNP (last 3 results) No results for input(s): PROBNP in the last 8760 hours. HbA1C: No results for input(s): HGBA1C in the last 72 hours. CBG: Recent Labs  Lab 08/10/21 2151 08/11/21 0937 08/11/21 1123 08/11/21 1733 08/11/21 2200  GLUCAP 348* 291* 285* 298* 299*   Lipid Profile: No results for input(s): CHOL, HDL, LDLCALC, TRIG, CHOLHDL, LDLDIRECT in the last 72 hours. Thyroid Function Tests: No results for input(s): TSH, T4TOTAL, FREET4, T3FREE, THYROIDAB in the last 72 hours. Anemia Panel: No results for input(s): VITAMINB12, FOLATE, FERRITIN, TIBC, IRON, RETICCTPCT in the last 72 hours. Sepsis Labs: No results for input(s): PROCALCITON, LATICACIDVEN in the last 168 hours.  Recent Results (from the past 240 hour(s))  Resp Panel by RT-PCR (Flu A&B, Covid) Nasopharyngeal Swab     Status: None   Collection Time: 08/09/21  3:22 PM   Specimen: Nasopharyngeal Swab; Nasopharyngeal(NP) swabs in vial transport medium  Result Value Ref Range Status   SARS Coronavirus 2 by RT PCR NEGATIVE NEGATIVE Final    Comment: (NOTE) SARS-CoV-2 target nucleic acids are NOT DETECTED.  The SARS-CoV-2 RNA is generally detectable in upper respiratory specimens during the acute phase of infection. The lowest concentration of SARS-CoV-2 viral copies this assay can detect is 138 copies/mL. A negative result does not preclude SARS-Cov-2 infection and should not be used as the sole basis for treatment or other patient management decisions. A negative result may occur with  improper specimen collection/handling, submission of specimen other than nasopharyngeal swab,  presence of viral mutation(s) within the areas targeted by this assay, and inadequate number of viral copies(<138 copies/mL). A negative result must be combined with clinical observations, patient history, and epidemiological information. The expected result is Negative.  Fact Sheet for Patients:  EntrepreneurPulse.com.au  Fact Sheet for Healthcare Providers:  IncredibleEmployment.be  This test is no t yet approved or cleared by the [place] FDA and  has been authorized for detection and/or diagnosis of SARS-CoV-2 by FDA under an Emergency Use Authorization (EUA). This EUA will remain  in effect (meaning this test can be used) for the duration of the COVID-19 declaration under Section 564(b)(1) of the Act, 21 U.S.C.section 360bbb-3(b)(1), unless the authorization is terminated  or revoked sooner.       Influenza A by PCR NEGATIVE NEGATIVE Final   Influenza B by PCR NEGATIVE NEGATIVE Final    Comment: (NOTE) The Xpert Xpress SARS-CoV-2/FLU/RSV plus assay is intended as an aid in the diagnosis of influenza from Nasopharyngeal  swab specimens and should not be used as a sole basis for treatment. Nasal washings and aspirates are unacceptable for Xpert Xpress SARS-CoV-2/FLU/RSV testing.  Fact Sheet for Patients: EntrepreneurPulse.com.au  Fact Sheet for Healthcare Providers: IncredibleEmployment.be  This test is not yet approved or cleared by the [place] FDA and has been authorized for detection and/or diagnosis of SARS-CoV-2 by FDA under an Emergency Use Authorization (EUA). This EUA will remain in effect (meaning this test can be used) for the duration of the COVID-19 declaration under Section 564(b)(1) of the Act, 21 U.S.C. section 360bbb-3(b)(1), unless the authorization is terminated or revoked.  Performed at [facility], [address] [place], [place] [address]        Radiology Studies: No results found.   Scheduled Meds:  carvedilol  6.25 mg Oral BID WC   ezetimibe  10 mg Oral Daily   insulin aspart  0-15 Units Subcutaneous TID WC   insulin aspart  3 Units Subcutaneous TID WC   insulin glargine-yfgn  32 Units Subcutaneous QHS   potassium chloride SA  20 mEq Oral BID   rivaroxaban  20 mg Oral Q supper   rosuvastatin  20 mg Oral QHS   sodium chloride flush  3 mL Intravenous Q12H   Continuous Infusions:  sodium chloride       LOS: 1 day     [name], MD [facility] If 7PM-7AM, please contact night-coverage 08/11/2021, 10:06 PM

## 2021-08-12 DIAGNOSIS — I5023 Acute on chronic systolic (congestive) heart failure: Secondary | ICD-10-CM | POA: Diagnosis not present

## 2021-08-12 LAB — CBC
HCT: 50.4 % (ref 39.0–52.0)
Hemoglobin: 17.1 g/dL — ABNORMAL HIGH (ref 13.0–17.0)
MCH: 28.4 pg (ref 26.0–34.0)
MCHC: 33.9 g/dL (ref 30.0–36.0)
MCV: 83.6 fL (ref 80.0–100.0)
Platelets: 200 10*3/uL (ref 150–400)
RBC: 6.03 MIL/uL — ABNORMAL HIGH (ref 4.22–5.81)
RDW: 13.6 % (ref 11.5–15.5)
WBC: 10.8 10*3/uL — ABNORMAL HIGH (ref 4.0–10.5)
nRBC: 0 % (ref 0.0–0.2)

## 2021-08-12 LAB — BASIC METABOLIC PANEL
Anion gap: 12 (ref 5–15)
BUN: 33 mg/dL — ABNORMAL HIGH (ref 6–20)
CO2: 27 mmol/L (ref 22–32)
Calcium: 9.4 mg/dL (ref 8.9–10.3)
Chloride: 95 mmol/L — ABNORMAL LOW (ref 98–111)
Creatinine, Ser: 1.25 mg/dL — ABNORMAL HIGH (ref 0.61–1.24)
GFR, Estimated: >60 mL/min (ref >60)
Glucose, Bld: 187 mg/dL — ABNORMAL HIGH (ref 70–99)
Potassium: 3.7 mmol/L (ref 3.5–5.1)
Sodium: 134 mmol/L — ABNORMAL LOW (ref 135–145)

## 2021-08-12 LAB — GLUCOSE, CAPILLARY
Glucose-Capillary: 196 mg/dL — ABNORMAL HIGH (ref 70–99)
Glucose-Capillary: 226 mg/dL — ABNORMAL HIGH (ref 70–99)
Glucose-Capillary: 378 mg/dL — ABNORMAL HIGH (ref 70–99)
Glucose-Capillary: 339 mg/dL — ABNORMAL HIGH (ref 70–99)

## 2021-08-12 LAB — MAGNESIUM: Magnesium: 2.1 mg/dL (ref 1.7–2.4)

## 2021-08-12 MED ORDER — INSULIN ASPART 100 UNIT/ML IJ SOLN
0.0000 [IU] | Freq: Three times a day (TID) | INTRAMUSCULAR | Status: DC
  Administered 2021-08-12: 21:00:00 11 [IU] via SUBCUTANEOUS
  Administered 2021-08-13: 12:00:00 5 [IU] via SUBCUTANEOUS
  Administered 2021-08-13: 08:00:00 3 [IU] via SUBCUTANEOUS
  Filled 2021-08-12 (×3): qty 1

## 2021-08-12 MED ORDER — FUROSEMIDE 10 MG/ML IJ SOLN
60.0000 mg | Freq: Once | INTRAMUSCULAR | Status: AC
  Administered 2021-08-12: 13:00:00 60 mg via INTRAVENOUS
  Filled 2021-08-12: qty 6

## 2021-08-12 MED ORDER — GUAIFENESIN ER 600 MG PO TB12
600.0000 mg | ORAL_TABLET | Freq: Two times a day (BID) | ORAL | Status: DC
  Administered 2021-08-12 – 2021-08-13 (×3): 600 mg via ORAL
  Filled 2021-08-12 (×3): qty 1

## 2021-08-12 NOTE — Progress Notes (Signed)
PROGRESS NOTE    Gabriel [name].  MVE:[id number] DOB: February 23, 1971 DOA: 08/09/2021 PCP: [name], FNP  129A/129A-AA   Assessment & Plan:   Principal Problem:   Acute on chronic systolic (congestive) heart failure (HCC) Active Problems:   Acute on chronic combined systolic (congestive) and diastolic (congestive) heart failure (HCC)   Dyspnea   Gabriel [name]. is a 50 y.o. 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  He presented to the hospital because of cough, worsening shortness of breath associated with wheezing.    Acute exacerbation of chronic systolic and diastolic CHF:  --takes home torsemide 40 mg BID and metolazone at home. 2D echo in October 2022 showed EF estimated at 40 to 45%, mild LVH and normal LV diastolic parameters.   --Cr trended up after first IV Lasix 60 mg x 3, so only dosing once per day. Plan: --IV lasix 60 mg x1 --cont coreg --hold home Entresto due to AKI  Type II DM with hyperglycemia:  --cont glargine 32u nightly --cont mealtime 6u TID --SSI TID   CAD  AKI --Cr 1.37 on presentation, trended up to 1.62 morning of 10/23.  Cr 1.26 in Oct 2022. Plan: --hold home Entresto  history of DVT and PE:  --cont home Xarelto   OSA on home CPAP  --cont CPAP nightly  Obesity, BMI 31.72  COPD with extensive smoking hx --does not appear to be in exacerbation.     DVT prophylaxis: [id number]  Code Status: Full code  Family Communication:  Level of care: Med-Surg Dispo:   The  patient is from: home Anticipated d/c is to: home Anticipated d/c date is: tomorrow Patient currently is not medically ready to d/c due to: IV lasix   Subjective and Interval History:  Pt reported improvement in chest tightness.  Still making good urine output.   Objective: Vitals:   08/12/21 0841 08/12/21 1125 08/12/21 1555 08/12/21 2038  BP: 132/86 134/78 130/74 137/78  Pulse: 75 70 70 80  Resp:  15 17 16   Temp:  98 F (36.7 C) 98.1 F (36.7 C) 98.1 F (36.7 C)  TempSrc:  Oral Oral Oral  SpO2:  97% 97% 96%  Weight:      Height:        Intake/Output Summary (Last 24 hours) at 08/12/2021 2055 Last data filed at 08/12/2021 1853 Gross per 24 hour  Intake 720 ml  Output 1000 ml  Net -280 ml   Filed Weights   08/09/21 1044 08/10/21 0845  Weight: 106.6 kg 106.1 kg    Examination:   Constitutional: NAD, AAOx3 HEENT: conjunctivae and lids normal, EOMI CV: No cyanosis.   RESP: normal respiratory effort, on RA Extremities: No effusions, edema in BLE SKIN: warm, dry Neuro: II - XII grossly intact.   Psych: Normal mood and affect.  Appropriate judgement and reason   Data Reviewed: I have personally reviewed following labs and imaging studies  CBC: Recent Labs  Lab 08/09/21 1046 08/11/21 0636 08/12/21 0421  WBC 9.3 9.8 10.8*  HGB 17.7* 16.4 17.1*  HCT 50.9 47.8 50.4  MCV 81.3 82.3 83.6  PLT 220 187 200  Basic Metabolic Panel: Recent Labs  Lab 08/09/21 1046 08/09/21 2149 08/10/21 0729 08/11/21 0636 08/12/21 0421  NA 134* 134* 137 138 134*  K 3.5 3.3* 3.8 3.5 3.7  CL 97* 96* 95* 100 95*  CO2 25 27 33* 28 27  GLUCOSE 381* 315* 240* 237* 187*  BUN 30* 34* 35* 37* 33*  CREATININE 1.37* 1.54* 1.62* 1.27* 1.25*  CALCIUM 9.8 9.3 10.0 9.3 9.4  MG  --  1.6*  --  1.9 2.1   GFR: Estimated Creatinine Clearance: 87 mL/min (A) (by C-G formula based on SCr of 1.25 mg/dL (H)). Liver Function Tests: No results for input(s): AST, ALT, ALKPHOS, BILITOT, PROT,  ALBUMIN in the last 168 hours. No results for input(s): LIPASE, AMYLASE in the last 168 hours. No results for input(s): AMMONIA in the last 168 hours. Coagulation Profile: No results for input(s): INR, PROTIME in the last 168 hours. Cardiac Enzymes: No results for input(s): CKTOTAL, CKMB, CKMBINDEX, TROPONINI in the last 168 hours. BNP (last 3 results) No results for input(s): PROBNP in the last 8760 hours. HbA1C: No results for input(s): HGBA1C in the last 72 hours. CBG: Recent Labs  Lab 08/11/21 2200 08/12/21 0723 08/12/21 1126 08/12/21 1557 08/12/21 2038  GLUCAP 299* 196* 226* 378* 339*   Lipid Profile: No results for input(s): CHOL, HDL, LDLCALC, TRIG, CHOLHDL, LDLDIRECT in the last 72 hours. Thyroid Function Tests: No results for input(s): TSH, T4TOTAL, FREET4, T3FREE, THYROIDAB in the last 72 hours. Anemia Panel: No results for input(s): VITAMINB12, FOLATE, FERRITIN, TIBC, IRON, RETICCTPCT in the last 72 hours. Sepsis Labs: No results for input(s): PROCALCITON, LATICACIDVEN in the last 168 hours.  Recent Results (from the past 240 hour(s))  Resp Panel by RT-PCR (Flu A&B, Covid) Nasopharyngeal Swab     Status: None   Collection Time: 08/09/21  3:22 PM   Specimen: Nasopharyngeal Swab; Nasopharyngeal(NP) swabs in vial transport medium  Result Value Ref Range Status   SARS Coronavirus 2 by RT PCR NEGATIVE NEGATIVE Final    Comment: (NOTE) SARS-CoV-2 target nucleic acids are NOT DETECTED.  The SARS-CoV-2 RNA is generally detectable in upper respiratory specimens during the acute phase of infection. The lowest concentration of SARS-CoV-2 viral copies this assay can detect is 138 copies/mL. A negative result does not preclude SARS-Cov-2 infection and should not be used as the sole basis for treatment or other patient management decisions. A negative result may occur with  improper specimen collection/handling, submission of specimen other than nasopharyngeal swab,  presence of viral mutation(s) within the areas targeted by this assay, and inadequate number of viral copies(<138 copies/mL). A negative result must be combined with clinical observations, patient history, and epidemiological information. The expected result is Negative.  Fact Sheet for Patients:  EntrepreneurPulse.com.au  Fact Sheet for Healthcare Providers:  IncredibleEmployment.be  This test is no t yet approved or cleared by the [place] FDA and  has been authorized for detection and/or diagnosis of SARS-CoV-2 by FDA under an Emergency Use Authorization (EUA). This EUA will remain  in effect (meaning this test can be used) for the duration of the COVID-19 declaration under Section 564(b)(1) of the Act, 21 U.S.C.section 360bbb-3(b)(1), unless the authorization is terminated  or revoked sooner.       Influenza A by PCR NEGATIVE NEGATIVE Final   Influenza B by PCR NEGATIVE NEGATIVE Final    Comment: (NOTE) The Xpert Xpress SARS-CoV-2/FLU/RSV plus assay is intended as an aid in the diagnosis of influenza from Nasopharyngeal swab specimens  and should not be used as a sole basis for treatment. Nasal washings and aspirates are unacceptable for Xpert Xpress SARS-CoV-2/FLU/RSV testing.  Fact Sheet for Patients: EntrepreneurPulse.com.au  Fact Sheet for Healthcare Providers: IncredibleEmployment.be  This test is not yet approved or cleared by the [place] FDA and has been authorized for detection and/or diagnosis of SARS-CoV-2 by FDA under an Emergency Use Authorization (EUA). This EUA will remain in effect (meaning this test can be used) for the duration of the COVID-19 declaration under Section 564(b)(1) of the Act, 21 U.S.C. section 360bbb-3(b)(1), unless the authorization is terminated or revoked.  Performed at [facility], [address], [place], [place] [address]        Radiology Studies: No results found.   Scheduled Meds:  carvedilol  6.25 mg Oral BID WC   ezetimibe  10 mg Oral Daily   guaiFENesin  600 mg Oral BID   insulin aspart  0-15 Units Subcutaneous TID AC & HS   insulin aspart  6 Units Subcutaneous TID WC   insulin glargine-yfgn  32 Units Subcutaneous QHS   potassium chloride SA  20 mEq Oral BID   rivaroxaban  20 mg Oral Q supper   rosuvastatin  20 mg Oral QHS   sodium chloride flush  3 mL Intravenous Q12H   Continuous Infusions:  sodium chloride       LOS: 2 days     [name], MD [facility] If 7PM-7AM, please contact night-coverage 08/12/2021, 8:55 PM

## 2021-08-13 DIAGNOSIS — I5023 Acute on chronic systolic (congestive) heart failure: Secondary | ICD-10-CM | POA: Diagnosis not present

## 2021-08-13 LAB — BASIC METABOLIC PANEL
Anion gap: 5 (ref 5–15)
BUN: 35 mg/dL — ABNORMAL HIGH (ref 6–20)
CO2: 31 mmol/L (ref 22–32)
Calcium: 8.9 mg/dL (ref 8.9–10.3)
Chloride: 98 mmol/L (ref 98–111)
Creatinine, Ser: 1.43 mg/dL — ABNORMAL HIGH (ref 0.61–1.24)
GFR, Estimated: 59 mL/min — ABNORMAL LOW (ref >60)
Glucose, Bld: 290 mg/dL — ABNORMAL HIGH (ref 70–99)
Potassium: 4.2 mmol/L (ref 3.5–5.1)
Sodium: 134 mmol/L — ABNORMAL LOW (ref 135–145)

## 2021-08-13 LAB — CBC
HCT: 53.6 % — ABNORMAL HIGH (ref 39.0–52.0)
Hemoglobin: 18.1 g/dL — ABNORMAL HIGH (ref 13.0–17.0)
MCH: 28.9 pg (ref 26.0–34.0)
MCHC: 33.8 g/dL (ref 30.0–36.0)
MCV: 85.5 fL (ref 80.0–100.0)
Platelets: 183 10*3/uL (ref 150–400)
RBC: 6.27 MIL/uL — ABNORMAL HIGH (ref 4.22–5.81)
RDW: 13.7 % (ref 11.5–15.5)
WBC: 10.9 10*3/uL — ABNORMAL HIGH (ref 4.0–10.5)
nRBC: 0 % (ref 0.0–0.2)

## 2021-08-13 LAB — MAGNESIUM: Magnesium: 2.1 mg/dL (ref 1.7–2.4)

## 2021-08-13 LAB — GLUCOSE, CAPILLARY
Glucose-Capillary: 195 mg/dL — ABNORMAL HIGH (ref 70–99)
Glucose-Capillary: 242 mg/dL — ABNORMAL HIGH (ref 70–99)

## 2021-08-13 MED ORDER — INSULIN LISPRO (1 UNIT DIAL) 100 UNIT/ML (KWIKPEN)
3.0000 [IU] | PEN_INJECTOR | Freq: Three times a day (TID) | SUBCUTANEOUS | 2 refills | Status: DC
Start: 2021-08-13 — End: 2022-01-19

## 2021-08-13 MED ORDER — GUAIFENESIN ER 600 MG PO TB12: 600.0000 mg | ORAL_TABLET | Freq: Two times a day (BID) | ORAL | 0 refills | Status: AC

## 2021-08-13 MED ORDER — FUROSEMIDE 40 MG PO TABS: 40.0000 mg | ORAL_TABLET | Freq: Every day | ORAL | 0 refills | Status: DC

## 2021-08-13 MED ORDER — TRULICITY 0.75 MG/0.5ML ~~LOC~~ SOAJ: 0.7500 mg | SUBCUTANEOUS | 2 refills | Status: DC

## 2021-08-13 MED ORDER — RIVAROXABAN 20 MG PO TABS: 20.0000 mg | ORAL_TABLET | Freq: Every day | ORAL | 0 refills | Status: DC

## 2021-08-13 MED ORDER — BASAGLAR KWIKPEN 100 UNIT/ML ~~LOC~~ SOPN: 28.0000 [IU] | PEN_INJECTOR | Freq: Every evening | SUBCUTANEOUS | 2 refills | Status: DC

## 2021-08-13 NOTE — TOC Initial Note (Signed)
Transition of Care (TOC) - Initial/Assessment Note    Patient Details  Name: Gabriel [name]. MRN: [id number] Date of Birth: 1971-02-26  Transition of Care [facility]) CM/SW Contact:    Candie Chroman, LCSW Phone Number: 08/13/2021, 9:46 AM  Clinical Narrative:  Readmission prevention screen complete. This CSW working remote today so called patient in the room, introduced role, and explained that discharge planning would be discussed. PCP is Tally Joe, FNP at [facility]. Patient drives himself to appointments. [facility] [address]. No issues obtaining medications. No home health or DME use prior to admission. Patient has a scale at home and understands importance of weighing daily. No further concerns. CSW encouraged patient to contact CSW as needed. CSW will continue to follow patient for support and facilitate return home when stable.                Expected Discharge Plan: Home/Self Care Barriers to Discharge: Continued Medical Work up   Patient Goals and CMS Choice        Expected Discharge Plan and Services Expected Discharge Plan: Home/Self Care     Post Acute Care Choice: NA Living arrangements for the past 2 months: Single Family Home                                      Prior Living Arrangements/Services Living arrangements for the past 2 months: Single Family Home   Patient language and need for interpreter reviewed:: Yes Do you feel safe going back to the place where you live?: Yes            Criminal Activity/Legal Involvement Pertinent to Current Situation/Hospitalization: No - Comment as needed  Activities of Daily Living Home Assistive Devices/Equipment: None ADL Screening (condition at time of admission) Patient's cognitive ability adequate to safely complete daily activities?: Yes Is the patient deaf or have difficulty hearing?: No Does the patient have difficulty seeing, even when wearing glasses/contacts?: No Does  the patient have difficulty concentrating, remembering, or making decisions?: No Patient able to express need for assistance with ADLs?: Yes Does the patient have difficulty dressing or bathing?: No Independently performs ADLs?: Yes (appropriate for developmental age) Does the patient have difficulty walking or climbing stairs?: No Weakness of Legs: None Weakness of Arms/Hands: None  Permission Sought/Granted                  Emotional Assessment Appearance:: Appears stated age Attitude/Demeanor/Rapport: Engaged, Gracious Affect (typically observed): Accepting, Appropriate, Calm, Pleasant Orientation: : Oriented to Self, Oriented to Place, Oriented to  Time, Oriented to Situation Alcohol / Substance Use: Not Applicable Psych Involvement: No (comment)  Admission diagnosis:  Shortness of breath [R06.02] Dyspnea [R06.00] Acute on chronic systolic congestive heart failure (HCC) [I50.23] Acute on chronic systolic (congestive) heart failure (HCC) [I50.23] Patient Active Problem List   Diagnosis Date Noted   Dyspnea 08/10/2021   Acute on chronic systolic (congestive) heart failure ([place]) 08/09/2021   Type II or unspecified type diabetes mellitus without mention of complication, not stated as uncontrolled 05/21/2021   Acute on chronic diastolic CHF (congestive heart failure) ([place]) 05/21/2021   Atherosclerosis of native coronary artery of native heart with unstable angina pectoris ([place]) 05/20/2021   Long term current use of anticoagulant 05/20/2021   Mitral regurgitation 05/20/2021   Mediastinal lymphadenopathy 05/20/2021   Acalculous cholecystitis 03/29/2021   Right upper quadrant abdominal  pain 03/28/2021   AKI (acute kidney injury) ([place]) 11/24/2020   Acute kidney injury ([place]) 11/23/2020   OSA on CPAP 11/23/2020   Chest pain 10/27/2020   Snoring 08/15/2020   Type II diabetes mellitus with renal manifestations ([place]) 06/11/2020   COPD (chronic obstructive pulmonary disease) ([place])  06/11/2020   Leukocytosis 06/11/2020   PE (pulmonary thromboembolism) ([place]) 06/11/2020   Hyperglycemia    Elevated brain natriuretic peptide (BNP) level    Uncontrolled type 2 diabetes mellitus with hyperglycemia, with long-term current use of insulin ([place]) 05/13/2020   Acute on chronic HFrEF (heart failure with reduced ejection fraction) ([place]) 05/12/2020   Erectile dysfunction due to arterial insufficiency 03/15/2020   Screening PSA (prostate specific antigen) 03/15/2020   Erectile dysfunction 03/02/2020   Acute pulmonary embolism ([place]) 01/03/2020   History of pulmonary embolism    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) 11/22/2019   CKD (chronic kidney disease), stage IIIa 06/26/2019   Type 2 diabetes mellitus with hyperlipidemia ([place]) 06/26/2019   Hypertensive urgency 06/26/2019   Acute on chronic combined systolic and diastolic CHF (congestive heart failure) ([place]) 09/02/2017   Other chest pain 08/24/2017   Dizziness    Noncompliance with medications 04/29/2017   Back pain 03/06/2017   Tobacco use 12/04/2016   Gallbladder sludge 12/04/2016   Acute on chronic systolic CHF (congestive heart failure) ([place]) 11/28/2016   Coronary artery disease, non-occlusive 03/15/2016   Atypical chest pain 02/16/2016   Orthostatic hypotension 01/23/2016   Hypomagnesemia 72/62/0355   Chronic systolic CHF (congestive heart failure) ([place]) 01/23/2016   NICM (nonischemic cardiomyopathy) ([place]) 01/17/2016   NSVT (nonsustained ventricular tachycardia)    Troponin I above reference range    Mixed hyperlipidemia 05/17/2015   COPD exacerbation ([place]) 05/17/2015   Essential hypertension 03/31/2015   Elevated troponin 03/20/2015   PCP:  [name], FNP Pharmacy[facility]   [id number] [place] [place] - [address] [place] [address] Phone: [phone] Fax: [phone]     Social Determinants of Health (SDOH) Interventions    Readmission Risk Interventions Readmission Risk Prevention Plan 08/13/2021 05/22/2021 11/27/2020  Transportation Screening Complete Complete -  PCP or Specialist Appt within 3-5 Days Complete - -  HRI or [place] for [place] Planning/Counseling Complete Complete -  Palliative Care Screening Not Applicable Not Applicable -  Medication Review Press photographer) Complete Complete -  PCP or Specialist appointment within 3-5 days of discharge - - Complete  HRI or [place] - - -  Some recent data might be hidden

## 2021-08-13 NOTE — TOC Transition Note (Signed)
Transition of Care [facility]) - CM/SW Discharge Note   Patient Details  Name: Gabriel [name]. MRN: [id number] Date of Birth: 1971/04/20  Transition of Care [facility]) CM/SW Contact:  Candie Chroman, LCSW Phone Number: 08/13/2021, 12:56 PM   Clinical Narrative:   Patient has orders to discharge home today. No further concerns. CSW signing off.  Final next level of care: Home/Self Care Barriers to Discharge: No Barriers Identified   Patient Goals and CMS Choice        Discharge Placement                    Patient and family notified of of transfer: 08/13/21  Discharge Plan and Services     Post Acute Care Choice: NA                               Social Determinants of Health (SDOH) Interventions     Readmission Risk Interventions Readmission Risk Prevention Plan 08/13/2021 05/22/2021 11/27/2020  Transportation Screening Complete Complete -  PCP or Specialist Appt within 3-5 Days Complete - -  HRI or [place] for [place] Planning/Counseling Complete Complete -  Palliative Care Screening Not Applicable Not Applicable -  Medication Review [employer]) Complete Complete -  PCP or Specialist appointment within 3-5 days of discharge - - Complete  HRI or [place] recent data might be hidden

## 2021-08-13 NOTE — Discharge Summary (Signed)
Physician Discharge Summary   Gabriel [name].  male DOB: 05/05/1971  GUY:[id number]  PCP: [name], FNP  Admit date: 08/09/2021 Discharge date: 08/13/2021  Admitted From: home Disposition:  home CODE STATUS: Full code  Discharge Instructions     Discharge instructions   Complete by: As directed    Since your home torsemide stopped working, I have switched you to oral Lasix 40 mg daily at discharge.  You can continue to take metolazone as needed.  Please follow up with your outpatient cardiologist.   Dr. [name] [facility] Course:  For full details, please see H&P, progress notes, consult notes and ancillary notes.  Briefly,  Gabriel Cianci. is a 50 y.o. 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  He presented to the hospital because of cough, worsening shortness of breath associated with wheezing.    Acute exacerbation of chronic systolic and diastolic CHF:  --takes home torsemide 40 mg BID and metolazone PRN at home. 2D echo in October 2022 showed EF estimated at 40 to 45%, mild LVH and normal LV diastolic parameters.   --Cr trended up after first 3 doses of IV Lasix 60 mg BID, so only dosing once per day since then.  Dyspnea and chest tightness improved with IV diuresis. --cont coreg --hold home Entresto due to AKI, resumed at discharge. --pt was discharged on oral Lasix 40 mg daily, with metolazone PRN, to be followed up by outpatient cardiology 1 week after discharge.   Type II DM with hyperglycemia:  --discharged on home insulin regimen as  below. --pt also takes trulicity PTA, which was refilled.   CAD --cont home statin, Zetia and Tricor   AKI --Cr 1.37 on presentation, trended up to 1.62 morning of 10/23, likely due to diuresis.  Cr 1.26 in Oct 2022. --home Entresto held during hospitalization, resumed at discharge.  history of DVT and PE:  --cont home Xarelto   OSA on home CPAP  --cont CPAP nightly   Obesity, BMI 31.72   COPD with extensive smoking hx --does not appear to be in exacerbation.     Discharge Diagnoses:  Principal Problem:   Acute on chronic systolic (congestive) heart failure (HCC) Active Problems:   Acute on chronic combined systolic (congestive) and diastolic (congestive) heart failure (HCC)   Dyspnea   30 Day Unplanned Readmission Risk Score    Flowsheet Row ED to Hosp-Admission (Current) from 08/09/2021 in [place] (1C)  30 Day Unplanned Readmission Risk Score (%) 22.4 Filed at 08/13/2021 1200       This score is the patient's risk of an unplanned readmission within 30 days of being discharged (0 -100%). The score is based on dignosis, age, lab data, medications, orders, and past utilization.   Low:  0-14.9   Medium: 15-21.9   High: 22-29.9   Extreme: 30 and above         Discharge Instructions:  Allergies as of 08/13/2021       Reactions   Metformin And Related Nausea And Vomiting   Prednisone Other (See Comments)   Reaction: Hallucinations    Zantac [  ranitidine Hcl] Diarrhea, Nausea Only   Night sweats        Medication List     STOP taking these medications    torsemide 20 MG tablet Commonly known as: DEMADEX       TAKE these medications    Basaglar KwikPen 100 UNIT/ML Inject 28 Units into the skin at bedtime.   carvedilol 6.25 MG tablet Commonly known as: COREG Take 1 tablet (6.25 mg total) by mouth 2 (two) times daily with a meal.   cetirizine 10 MG tablet Commonly known as: ZYRTEC Take 10 mg by mouth daily.    Combivent Respimat 20-100 MCG/ACT Aers respimat Generic drug: Ipratropium-Albuterol Inhale 1 puff into the lungs every 6 (six) hours as needed for wheezing.   ezetimibe 10 MG tablet Commonly known as: ZETIA Take 1 tablet (10 mg total) by mouth daily.   fenofibrate 145 MG tablet Commonly known as: TRICOR TAKE 1 TABLET BY MOUTH EVERY DAY   furosemide 40 MG tablet Commonly known as: Lasix Take 1 tablet (40 mg total) by mouth daily.   guaiFENesin 600 MG 12 hr tablet Commonly known as: MUCINEX Take 1 tablet (600 mg total) by mouth 2 (two) times daily for 7 days.   insulin lispro 100 UNIT/ML KwikPen Commonly known as: HUMALOG Inject 3 Units into the skin 3 (three) times daily.   metolazone 5 MG tablet Commonly known as: ZAROXOLYN TAKE 1 TABLET (5 MG TOTAL) BY MOUTH DAILY AS NEEDED (FOR WEIGHT OVER 245).   nitroGLYCERIN 0.4 MG SL tablet Commonly known as: NITROSTAT Place 1 tablet (0.4 mg total) under the tongue every 5 (five) minutes x 3 doses as needed for chest pain.   potassium chloride SA 20 MEQ tablet Commonly known as: Klor-Con M20 Take 1 tablet daily   rivaroxaban 20 MG Tabs tablet Commonly known as: XARELTO Take 1 tablet (20 mg total) by mouth daily with supper.   rosuvastatin 20 MG tablet Commonly known as: CRESTOR TAKE 1 TABLET BY MOUTH EVERYDAY AT BEDTIME   sacubitril-valsartan 49-51 MG Commonly known as: ENTRESTO Take 0.5 tablets by mouth 2 (two) times daily.   Trulicity 1.28 NO/6.7EH Sopn Generic drug: Dulaglutide Inject 0.75 mg into the skin once a week.         Follow-up Information     Minna Merritts, MD Follow up in 1 week(s).   Specialty: Cardiology Contact information: [address] [place] [address] [phone]                 Allergies  Allergen Reactions   Metformin And Related Nausea And Vomiting   Prednisone Other (See Comments)    Reaction: Hallucinations     Zantac [Ranitidine Hcl] Diarrhea and  Nausea Only    Night sweats     The results of significant diagnostics from this hospitalization (including imaging, microbiology, ancillary and laboratory) are listed below for reference.   Consultations:   Procedures/Studies: DG Chest 2 View  Result Date: 08/09/2021 CLINICAL DATA:  Short of breath EXAM: CHEST - 2 VIEW COMPARISON:  05/20/2021 FINDINGS: The heart size and mediastinal contours are within normal limits. Both lungs are clear. The visualized skeletal structures are unremarkable. IMPRESSION: No active cardiopulmonary disease. Electronically Signed   By: Franchot Gallo M.D.   On: 08/09/2021 11:05   CT Angio Chest PE W/Cm &/Or Wo Cm  Result Date: 08/09/2021 CLINICAL DATA:  Pulmonary embolism suspected. High probability. Bloody sputum. EXAM: CT ANGIOGRAPHY CHEST WITH CONTRAST TECHNIQUE: Multidetector CT  imaging of the chest was performed using the standard protocol during bolus administration of intravenous contrast. Multiplanar CT image reconstructions and MIPs were obtained to evaluate the vascular anatomy. CONTRAST:  85mL OMNIPAQUE IOHEXOL 350 MG/ML SOLN COMPARISON:  None. FINDINGS: Cardiovascular: No filling defects within the pulmonary arteries to suggest acute pulmonary embolism. Mediastinum/Nodes: No significant vascular findings. Normal heart size. No pericardial effusion. Lungs/Pleura: No suspicious pulmonary nodules. Normal pleural. Airways normal. Diffuse ground-glass opacities. No pulmonary infarction. No pneumonia. Upper Abdomen: Limited view of the liver, kidneys, pancreas are unremarkable. Normal adrenal glands. Musculoskeletal: No aggressive osseous lesion. Review of the MIP images confirms the above findings. IMPRESSION: 1. No evidence acute pulmonary embolism. 2. Diffuse ground-glass opacities suggest mild pulmonary edema. Electronically Signed   By: Suzy Bouchard M.D.   On: 08/09/2021 14:12      Labs: BNP (last 3 results) Recent Labs    02/14/21 2020  05/20/21 1404 08/09/21 1340  BNP 795.3* 2,525.3* 761.6*   Basic Metabolic Panel: Recent Labs  Lab 08/09/21 2149 08/10/21 0729 08/11/21 0636 08/12/21 0421 08/13/21 0617 08/13/21 0952  NA 134* 137 138 134* RESULTS SUGGEST CONTAMINATION. SUGGEST RECOLLECTION USING STERILE METHOD. 134*  K 3.3* 3.8 3.5 3.7 RESULTS SUGGEST CONTAMINATION. SUGGEST RECOLLECTION USING STERILE METHOD. 4.2  CL 96* 95* 100 95* RESULTS SUGGEST CONTAMINATION. SUGGEST RECOLLECTION USING STERILE METHOD. 98  CO2 27 33* 28 27 RESULTS SUGGEST CONTAMINATION. SUGGEST RECOLLECTION USING STERILE METHOD. 31  GLUCOSE 315* 240* 237* 187* RESULTS SUGGEST CONTAMINATION. SUGGEST RECOLLECTION USING STERILE METHOD. 290*  BUN 34* 35* 37* 33* RESULTS SUGGEST CONTAMINATION. SUGGEST RECOLLECTION USING STERILE METHOD. 35*  CREATININE 1.54* 1.62* 1.27* 1.25* RESULTS SUGGEST CONTAMINATION. SUGGEST RECOLLECTION USING STERILE METHOD. 1.43*  CALCIUM 9.3 10.0 9.3 9.4 RESULTS SUGGEST CONTAMINATION. SUGGEST RECOLLECTION USING STERILE METHOD. 8.9  MG 1.6*  --  1.9 2.1 RESULTS SUGGEST CONTAMINATION. SUGGEST RECOLLECTION USING STERILE METHOD. 2.1   Liver Function Tests: No results for input(s): AST, ALT, ALKPHOS, BILITOT, PROT, ALBUMIN in the last 168 hours. No results for input(s): LIPASE, AMYLASE in the last 168 hours. No results for input(s): AMMONIA in the last 168 hours. CBC: Recent Labs  Lab 08/09/21 1046 08/11/21 0636 08/12/21 0421 08/13/21 0617  WBC 9.3 9.8 10.8* 10.9*  HGB 17.7* 16.4 17.1* 18.1*  HCT 50.9 47.8 50.4 53.6*  MCV 81.3 82.3 83.6 85.5  PLT 220 187 200 183   Cardiac Enzymes: No results for input(s): CKTOTAL, CKMB, CKMBINDEX, TROPONINI in the last 168 hours. BNP: Invalid input(s): POCBNP CBG: Recent Labs  Lab 08/12/21 1126 08/12/21 1557 08/12/21 2038 08/13/21 0752 08/13/21 1147  GLUCAP 226* 378* 339* 195* 242*   D-Dimer No results for input(s): DDIMER in the last 72 hours. Hgb A1c No results for input(s):  HGBA1C in the last 72 hours. Lipid Profile No results for input(s): CHOL, HDL, LDLCALC, TRIG, CHOLHDL, LDLDIRECT in the last 72 hours. Thyroid function studies No results for input(s): TSH, T4TOTAL, T3FREE, THYROIDAB in the last 72 hours.  Invalid input(s): FREET3 Anemia work up No results for input(s): VITAMINB12, FOLATE, FERRITIN, TIBC, IRON, RETICCTPCT in the last 72 hours. Urinalysis    Component Value Date/Time   COLORURINE STRAW (A) 02/19/2021 0838   APPEARANCEUR CLEAR (A) 02/19/2021 0838   APPEARANCEUR Clear 03/08/2020 0951   LABSPEC 1.009 02/19/2021 0838   LABSPEC 1.019 11/12/2012 1713   PHURINE 5.0 02/19/2021 0838   GLUCOSEU >=500 (A) 02/19/2021 0838   GLUCOSEU Negative 11/12/2012 1713   HGBUR SMALL (A) 02/19/2021 0838   BILIRUBINUR  NEGATIVE 02/19/2021 0838   BILIRUBINUR Negative 03/08/2020 0951   BILIRUBINUR Negative 11/12/2012 1713   KETONESUR NEGATIVE 02/19/2021 0838   PROTEINUR 30 (A) 02/19/2021 0838   NITRITE NEGATIVE 02/19/2021 0838   LEUKOCYTESUR NEGATIVE 02/19/2021 0838   LEUKOCYTESUR Negative 11/12/2012 1713   Sepsis Labs Invalid input(s): PROCALCITONIN,  WBC,  LACTICIDVEN Microbiology Recent Results (from the past 240 hour(s))  Resp Panel by RT-PCR (Flu A&B, Covid) Nasopharyngeal Swab     Status: None   Collection Time: 08/09/21  3:22 PM   Specimen: Nasopharyngeal Swab; Nasopharyngeal(NP) swabs in vial transport medium  Result Value Ref Range Status   SARS Coronavirus 2 by RT PCR NEGATIVE NEGATIVE Final    Comment: (NOTE) SARS-CoV-2 target nucleic acids are NOT DETECTED.  The SARS-CoV-2 RNA is generally detectable in upper respiratory specimens during the acute phase of infection. The lowest concentration of SARS-CoV-2 viral copies this assay can detect is 138 copies/mL. A negative result does not preclude SARS-Cov-2 infection and should not be used as the sole basis for treatment or other patient management decisions. A negative result may occur with   improper specimen collection/handling, submission of specimen other than nasopharyngeal swab, presence of viral mutation(s) within the areas targeted by this assay, and inadequate number of viral copies(<138 copies/mL). A negative result must be combined with clinical observations, patient history, and epidemiological information. The expected result is Negative.  Fact Sheet for Patients:  EntrepreneurPulse.com.au  Fact Sheet for Healthcare Providers:  IncredibleEmployment.be  This test is no t yet approved or cleared by the [place] FDA and  has been authorized for detection and/or diagnosis of SARS-CoV-2 by FDA under an Emergency Use Authorization (EUA). This EUA will remain  in effect (meaning this test can be used) for the duration of the COVID-19 declaration under Section 564(b)(1) of the Act, 21 U.S.C.section 360bbb-3(b)(1), unless the authorization is terminated  or revoked sooner.       Influenza A by PCR NEGATIVE NEGATIVE Final   Influenza B by PCR NEGATIVE NEGATIVE Final    Comment: (NOTE) The Xpert Xpress SARS-CoV-2/FLU/RSV plus assay is intended as an aid in the diagnosis of influenza from Nasopharyngeal swab specimens and should not be used as a sole basis for treatment. Nasal washings and aspirates are unacceptable for Xpert Xpress SARS-CoV-2/FLU/RSV testing.  Fact Sheet for Patients: EntrepreneurPulse.com.au  Fact Sheet for Healthcare Providers: IncredibleEmployment.be  This test is not yet approved or cleared by the [place] FDA and has been authorized for detection and/or diagnosis of SARS-CoV-2 by FDA under an Emergency Use Authorization (EUA). This EUA will remain in effect (meaning this test can be used) for the duration of the COVID-19 declaration under Section 564(b)(1) of the Act, 21 U.S.C. section 360bbb-3(b)(1), unless the authorization is terminated  or revoked.  Performed at [facility], [address] [place], [place] [address]      Total time spend on discharging this patient, including the last patient exam, discussing the hospital stay, instructions for ongoing care as it relates to all pertinent caregivers, as well as preparing the medical discharge records, prescriptions, and/or referrals as applicable, is 35 minutes.    [name], MD  [facility] 08/13/2021, 1:06 PM

## 2021-08-14 ENCOUNTER — Telehealth: Payer: Self-pay

## 2021-08-14 NOTE — Telephone Encounter (Signed)
LVM to return my call °

## 2021-08-26 NOTE — Progress Notes (Signed)
Patient ID: Gabriel Rison., male    DOB: November 30, 1970, 51 y.o.   MRN: [id number]  HPI  Gabriel [name] is a 51 y/o male with a history of HTN, diabetes, COPD, CKD, asthma, previous tobacco use and chronic heart failure.   Echo results from 05/21/21 reviewed and showed an EF of 40-45% along with mild LVH and mild Gabriel. Echo report from 05/21/21 reviewed and showed an EF of 40-45% along with mild LVH and mild Gabriel. Echo report from 06/08/20 reviewed and showed an EF of 35-40% along with mildly elevated PA pressure of 44.7 mmHg, moderate LAE and mild/moderate Gabriel. Echo report from 11/23/19 reviewed and showed an EF of 25-30% with mild LVH and moderately elevated PA Pressure. Echo report from 06/26/2019 reviewed and showed an EF of 30-35% along with moderately elevated PA pressure. Echo done 04/29/17 showed EF 45-50%. Echo report that was done 11/28/16 and it showed an EF of 40-45% along with mild Gabriel. EF has improved from 30-35% July 2017.  RHC/LHC done 06/13/20 and showed: Severe single-vessel coronary artery disease with chronic total occlusion of the proximal RCA.  The distal vessel fills via left to right collaterals. Mild to moderate, nonobstructive CAD involving the LAD and LCx with up to 50% stenosis. Mildly elevated left and right heart filling pressures. Moderate pulmonary hypertension (mean PAP 40 mmHg) with significantly elevated pulmonary vascular resistance (PVR 7.4 WU). Severely reduced Fick cardiac output/index.  Admitted 08/09/21 due to acute on chronic heart failure. Initially given IV lasix with transition to oral diuretics. Entresto held due to AKI but then resumed prior to discharge. Discharged after 4 days. Admitted 05/20/21 due to chest pain and shortness of breath. BNP elevated so IV lasix given. Cardiology consult obtained. Entresto and IV lasix had to be held due to rising creatinine. Chronically elevated troponins. Discharged after 3 days.   He presents today for a follow-up visit with a  chief complaint of moderate shortness of breath with minimal exertion. He describes this as chronic in nature having been present for years although has worsened over the last several weeks. He has associated fatigue, cough, intermittent chest pain, abdominal distention, right foot pain and gradual weight gain. He denies any difficulty sleeping, dizziness, palpitations, pedal edema or wheezing.   Says that he's being referred back to the [facility] for consideration of cardiomems.   Past Medical History:  Diagnosis Date   CAD (coronary artery disease)    a. 04/2015 low risk MV;  b. 12/2016 Cath: minor irregs in LAD/Diag/LCX/OM, RCA 40p/m/d; c. 05/2020 Cath: LM nl, LAD 50d, LCX 30p, OM1 40, RCA 100p w/ L->L collats. CO/CI 3.1/1.3-->Med rx.   Chest wall pain, chronic    CHF (congestive heart failure) (HCC)    Chronic Troponin Elevation    CKD (chronic kidney disease), stage II-III    COPD (chronic obstructive pulmonary disease) (HCC)    Diabetes mellitus without complication (HCC)    HFrEF (heart failure with reduced ejection fraction) ([place])    Hypertension    Mitral regurgitation    Mild to moderate by October 2021 echocardiogram.   Mixed Ischemic & NICM (nonischemic cardiomyopathy) ([place])    a. 03/2015 Echo: EF 45-50%; b. 12/2015 Echo: EF 20-25%; c. 02/2016 Echo: EF 30-35%; d. 11/2016 Echo: EF 40-45%; e. 06/2019 Echo: EF 30-35%; f. 11/2019 Echo: EF 25-30%; g. 05/2020 Echo: EF 35-40%, glob HK, nl RV size/fxn, PASP 44.43mmHg, mod dil LA. Mild to mod Gabriel.   Myocardial infarct ([place])  NSVT (nonsustained ventricular tachycardia)    a. 12/2015 noted on tele-->amio;  b. 12/2015 Event monitor: no VT noted.   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  12/31/19: RUL and RLL PEs.   Syncope    a. 01/2016 - felt to be vasovagal.   Past Surgical History:  Procedure Laterality Date   AMPUTATION     CARDIAC CATHETERIZATION     FINGER  AMPUTATION     Traumatic   FINGER FRACTURE SURGERY Left    LEFT HEART CATH AND CORONARY ANGIOGRAPHY N/A 01/06/2017   Procedure: Left Heart Cath and Coronary Angiography;  Surgeon: Wellington Hampshire, MD;  Location: [facility];  Service: Cardiovascular;  Laterality: N/A;   RIGHT/LEFT HEART CATH AND CORONARY ANGIOGRAPHY N/A 06/13/2020   Procedure: RIGHT/LEFT HEART CATH AND CORONARY ANGIOGRAPHY;  Surgeon: Nelva Bush, MD;  Location: [facility];  Service: Cardiovascular;  Laterality: N/A;   Family History  Problem Relation Age of Onset   Diabetes Mother    Diabetes Mellitus II Mother    Hypothyroidism Mother    Hypertension Mother    Kidney failure Mother        Dialysis   Heart attack Mother        51 yo approximately   Hypertension Father    Gout Father    Cancer Maternal Grandfather    Diabetes Maternal Grandfather    Cancer Paternal Aunt    Social History   Tobacco Use   Smoking status: Former    Packs/day: 0.50    Years: 33.00    Pack years: 16.50    Types: Cigarettes    Quit date: 05/08/2020    Years since quitting: 1.3   Smokeless tobacco: Former    Quit date: 05/08/2020   Tobacco comments:    Quit Sept 2021  Substance Use Topics   Alcohol use: Yes    Alcohol/week: 0.0 standard drinks    Comment: occassionally   Allergies  Allergen Reactions   Metformin And Related Nausea And Vomiting   Prednisone Other (See Comments)    Reaction: Hallucinations     Zantac [Ranitidine Hcl] Diarrhea and Nausea Only    Night sweats   Prior to Admission medications   Medication Sig Start Date End Date Taking? Authorizing Provider  carvedilol (COREG) 6.25 MG tablet Take 1 tablet (6.25 mg total) by mouth 2 (two) times daily with a meal. 08/02/20  Yes Larey Dresser, MD  Dulaglutide (TRULICITY) 3.87 FI/4.3PI SOPN Inject 0.75 mg into the skin once a week. Patient taking differently: Inject 0.75 mg into the skin once a week. 08/13/21 11/11/21 Yes [name], MD   ezetimibe (ZETIA) 10 MG tablet Take 1 tablet (10 mg total) by mouth daily. 04/04/21  Yes Minna Merritts, MD  fenofibrate (TRICOR) 145 MG tablet TAKE 1 TABLET BY MOUTH EVERY DAY 05/29/21  Yes [name] A, FNP  furosemide (LASIX) 40 MG tablet Take 1 tablet (40 mg total) by mouth daily. 08/13/21 09/12/21 Yes [name], MD  insulin lispro (HUMALOG) 100 UNIT/ML KwikPen Inject 3 Units into the skin 3 (three) times daily. 08/13/21 11/11/21 Yes [name], MD  metolazone (ZAROXOLYN) 5 MG tablet TAKE 1 TABLET (5 MG TOTAL) BY MOUTH DAILY AS NEEDED (FOR WEIGHT OVER 245). 05/29/21  Yes [name] A, FNP  potassium chloride SA (KLOR-CON M20) 20 MEQ tablet Take 1 tablet daily 05/29/21  Yes [place],  Aura Fey, FNP  rivaroxaban (XARELTO) 20 MG TABS tablet Take 1 tablet (20 mg total) by mouth daily with supper. 08/13/21 11/11/21 Yes [name], MD  rosuvastatin (CRESTOR) 20 MG tablet TAKE 1 TABLET BY MOUTH EVERYDAY AT BEDTIME 05/29/21  Yes Zyriah Mask A, FNP  sacubitril-valsartan (ENTRESTO) 24-26 MG Take 1 tablet by mouth 2 (two) times daily.   Yes [provider]  Insulin Glargine (BASAGLAR KWIKPEN) 100 UNIT/ML Inject 28 Units into the skin at bedtime. Patient not taking: Reported on 08/28/2021 08/13/21 11/11/21  [name], MD  Ipratropium-Albuterol (COMBIVENT RESPIMAT) 20-100 MCG/ACT AERS respimat Inhale 1 puff into the lungs every 6 (six) hours as needed for wheezing. Patient not taking: Reported on 08/28/2021    [provider]  nitroGLYCERIN (NITROSTAT) 0.4 MG SL tablet Place 1 tablet (0.4 mg total) under the tongue every 5 (five) minutes x 3 doses as needed for chest pain. Patient not taking: Reported on 08/28/2021 06/09/20   Lorella Nimrod, MD    Review of Systems  Constitutional:  Positive for fatigue (easily). Negative for appetite change.  HENT:  Positive for postnasal drip. Negative for congestion and sore throat.   Eyes: Negative.   Respiratory:  Positive for cough and shortness of  breath (worsening). Negative for chest tightness and wheezing.   Cardiovascular:  Positive for chest pain (intermittent). Negative for palpitations and leg swelling.  Gastrointestinal:  Positive for abdominal distention. Negative for abdominal pain.  Endocrine: Negative.   Genitourinary: Negative.   Musculoskeletal:  Positive for arthralgias (right foot).  Skin: Negative.   Allergic/Immunologic: Negative.   Neurological:  Negative for dizziness and light-headedness.  Hematological:  Negative for adenopathy. Does not bruise/bleed easily.  Psychiatric/Behavioral:  Negative for dysphoric mood and sleep disturbance (sleeping on 1 pillow). The patient is not nervous/anxious.    Vitals:   08/28/21 0812 08/28/21 0820  BP: (!) 179/89 (!) 167/94  Pulse: 88   Resp: 20   SpO2: 99%   Weight: 251 lb 2 oz (113.9 kg)   Height: 6' (1.829 m)    Wt Readings from Last 3 Encounters:  08/28/21 251 lb 2 oz (113.9 kg)  08/13/21 233 lb 4 oz (105.8 kg)  05/29/21 241 lb 8 oz (109.5 kg)   Lab Results  Component Value Date   CREATININE 1.43 (H) 08/13/2021   CREATININE  08/13/2021    RESULTS SUGGEST CONTAMINATION. SUGGEST RECOLLECTION USING STERILE METHOD.   CREATININE 1.25 (H) 08/12/2021    Physical Exam Vitals and nursing note reviewed.  Constitutional:      Appearance: He is well-developed.  HENT:     Head: Normocephalic and atraumatic.  Neck:     Vascular: No JVD.  Cardiovascular:     Rate and Rhythm: Normal rate and regular rhythm.  Pulmonary:     Effort: Pulmonary effort is normal.     Breath sounds: No wheezing or rales.  Chest:     Chest wall: No tenderness.  Abdominal:     General: There is distension.     Tenderness: There is no abdominal tenderness.  Musculoskeletal:        General: Tenderness (right heel area) present.     Cervical back: Normal range of motion and neck supple.     Right lower leg: No edema.     Left lower leg: No edema.  Skin:    General: Skin is warm and  dry.  Neurological:     Mental Status: He is alert and oriented to person, place, and  time.  Psychiatric:        Behavior: Behavior normal.        Thought Content: Thought content normal.   Assessment & Plan:  1: Acute on Chronic heart failure with reduced ejection fraction- - NYHA class III - moderately fluid overloaded today with weight gain, abdominal distention and worsening SOB - weighing daily and reminded to call for an overnight weight gain of >2 pounds or a weekly weight gain of >5 pounds; weight at home has been rising.  - weight up 10 pounds since last visit here 3 months ago - has order in place to take metolazone if his weight is >245 pounds but he hasn't taken it - will increase his daily furosemide to 60mg  daily (1.5 tablet) along with metolazone daily for the next 2 days - will double potassium on these next 2 days as well - not adding salt and he was encouraged to closely follow a 2000mg  sodium diet - saw cardiology Kathlen Mody) 06/18/21 - on GDMT of jardiance, carvedilol & entresto - will increase his carvedilol to 12.5mg  BID; he will finish his current bottle by taking 2 tablets BID - saw EP Caryl Comes) 09/02/19; discussion about ICD  - saw provider at [facility] 07/06/20; being referred back to them for consideration of cardiomems - BNP from 08/09/21 was 487.8  2: HTN- - BP initially elevated (179/89) & not much better after recheck (167/94) - increasing daily diuretic along with daily carvedilol - saw PCP @ [facility] 04/13/20; has medicaid now & has PCP appt currently scheduled at [facility] on 09/05/21 - BMP from 08/13/21 reviewed and showed sodium 134, potassium 4.2, creatinine 1.43 and GFR 59  3: Diabetes-   -  A1c 8.1% on 03/28/21 - glucose today was 140 - completed some classes at the [facility]   Patient did not bring his medications nor a list. Each medication was verbally reviewed with the patient and he was encouraged to bring  the bottles to every visit to confirm accuracy of list.    Return in 1 week, sooner if needed for any questions/problems before then.

## 2021-08-28 ENCOUNTER — Ambulatory Visit: Payer: BC Managed Care – PPO | Attending: Family | Admitting: Family

## 2021-08-28 ENCOUNTER — Encounter: Payer: Self-pay | Admitting: Pharmacist

## 2021-08-28 ENCOUNTER — Other Ambulatory Visit: Payer: Self-pay

## 2021-08-28 ENCOUNTER — Encounter: Payer: Self-pay | Admitting: Family

## 2021-08-28 VITALS — BP 167/94 | HR 88 | Resp 20 | Ht 72.0 in | Wt 251.1 lb

## 2021-08-28 DIAGNOSIS — M79671 Pain in right foot: Secondary | ICD-10-CM | POA: Insufficient documentation

## 2021-08-28 DIAGNOSIS — I5023 Acute on chronic systolic (congestive) heart failure: Secondary | ICD-10-CM | POA: Diagnosis not present

## 2021-08-28 DIAGNOSIS — J449 Chronic obstructive pulmonary disease, unspecified: Secondary | ICD-10-CM | POA: Insufficient documentation

## 2021-08-28 DIAGNOSIS — I272 Pulmonary hypertension, unspecified: Secondary | ICD-10-CM | POA: Diagnosis not present

## 2021-08-28 DIAGNOSIS — Z79899 Other long term (current) drug therapy: Secondary | ICD-10-CM | POA: Diagnosis not present

## 2021-08-28 DIAGNOSIS — I340 Nonrheumatic mitral (valve) insufficiency: Secondary | ICD-10-CM | POA: Diagnosis not present

## 2021-08-28 DIAGNOSIS — N189 Chronic kidney disease, unspecified: Secondary | ICD-10-CM | POA: Diagnosis not present

## 2021-08-28 DIAGNOSIS — I10 Essential (primary) hypertension: Secondary | ICD-10-CM

## 2021-08-28 DIAGNOSIS — Z794 Long term (current) use of insulin: Secondary | ICD-10-CM

## 2021-08-28 DIAGNOSIS — I2510 Atherosclerotic heart disease of native coronary artery without angina pectoris: Secondary | ICD-10-CM | POA: Insufficient documentation

## 2021-08-28 DIAGNOSIS — I130 Hypertensive heart and chronic kidney disease with heart failure and stage 1 through stage 4 chronic kidney disease, or unspecified chronic kidney disease: Secondary | ICD-10-CM | POA: Insufficient documentation

## 2021-08-28 DIAGNOSIS — Z87891 Personal history of nicotine dependence: Secondary | ICD-10-CM | POA: Insufficient documentation

## 2021-08-28 DIAGNOSIS — E1122 Type 2 diabetes mellitus with diabetic chronic kidney disease: Secondary | ICD-10-CM | POA: Insufficient documentation

## 2021-08-28 DIAGNOSIS — R140 Abdominal distension (gaseous): Secondary | ICD-10-CM | POA: Insufficient documentation

## 2021-08-28 DIAGNOSIS — Z7984 Long term (current) use of oral hypoglycemic drugs: Secondary | ICD-10-CM | POA: Insufficient documentation

## 2021-08-28 NOTE — Patient Instructions (Addendum)
Continue weighing daily and call for an overnight weight gain of 3 pounds or more or a weekly weight gain of more than 5 pounds.   Increase your daily lasix to 60mg  daily (1 and 1/2 tablets daily)   Take metolazone 1/2 hour before your morning lasix for the next 2 days. On these 2 days, double your potassium tablets   Increase your carvedilol to 12.5mg  twice a day. You can finish up what you currently have by taking 2 tablets in the morning and 2 tablets in the evening

## 2021-08-28 NOTE — Progress Notes (Signed)
[facility] - PHARMACIST COUNSELING NOTE  Guideline-Directed Medical Therapy/Evidence Based Medicine  ACE/ARB/ARNI: Gabriel [name] 24/26 twice daily Beta Blocker: Carvedilol 6.25 mg twice daily Aldosterone Antagonist: none, AKI in the past Diuretic: Furosemide 40 mg daily SGLT2i:  none , AKI in the past   Adherence Assessment  Do you ever forget to take your medication? [] Yes [x] No  Do you ever skip doses due to side effects? [] Yes [x] No  Do you have trouble affording your medicines? [] Yes [x] No  Are you ever unable to pick up your medication due to transportation difficulties? [] Yes [x] No  Do you ever stop taking your medications because you don't believe they are helping? [] Yes [x] No  Do you check your weight daily? [] Yes [x] No   Adherence strategy: pill box, fill weekly  Barriers to obtaining medications: none  Diet: eat mainly at home, avoid adding salt to food, still eating our few times per week, and adding extra sauce or condiments to "some" meals.  Vital signs: HR 88, BP 167/94, weight (pounds) 251lbs (dry weight ~ 235lb)  ECHO: Date Oct/10/2020, EF 40-45%, LV not optimally defined to evaluate regional wall motion. The left  ventricular internal cavity size was mildly dilated. There is mild left  ventricular hypertrophy. Left ventricular diastolic parameters were  normal.   Cath: Date 06/13/2020, notes severely reduced cardiac output, consider further escalation ofr Entresto  BMP Latest Ref Rng & Units 08/13/2021 08/13/2021 08/12/2021  Glucose 70 - 99 mg/dL 290(H) RESULTS SUGGEST CONTAMINATION. SUGGEST RECOLLECTION USING STERILE METHOD. 187(H)  BUN 6 - 20 mg/dL 35(H) RESULTS SUGGEST CONTAMINATION. SUGGEST RECOLLECTION USING STERILE METHOD. 33(H)  Creatinine 0.61 - 1.24 mg/dL 1.43(H) RESULTS SUGGEST CONTAMINATION. SUGGEST RECOLLECTION USING STERILE METHOD. 1.25(H)  BUN/Creat Ratio 9 - 20 - - -  Sodium 135 - 145 mmol/L 134(L)  RESULTS SUGGEST CONTAMINATION. SUGGEST RECOLLECTION USING STERILE METHOD. 134(L)  Potassium 3.5 - 5.1 mmol/L 4.2 RESULTS SUGGEST CONTAMINATION. SUGGEST RECOLLECTION USING STERILE METHOD. 3.7  Chloride 98 - 111 mmol/L 98 RESULTS SUGGEST CONTAMINATION. SUGGEST RECOLLECTION USING STERILE METHOD. 95(L)  CO2 22 - 32 mmol/L 31 RESULTS SUGGEST CONTAMINATION. SUGGEST RECOLLECTION USING STERILE METHOD. 27  Calcium 8.9 - 10.3 mg/dL 8.9 RESULTS SUGGEST CONTAMINATION. SUGGEST RECOLLECTION USING STERILE METHOD. 9.4    Past Medical History:  Diagnosis Date   CAD (coronary artery disease)    a. 04/2015 low risk MV;  b. 12/2016 Cath: minor irregs in LAD/Diag/LCX/OM, RCA 40p/m/d; c. 05/2020 Cath: LM nl, LAD 50d, LCX 30p, OM1 40, RCA 100p w/ L->L collats. CO/CI 3.1/1.3-->Med rx.   Chest wall pain, chronic    CHF (congestive heart failure) (HCC)    Chronic Troponin Elevation    CKD (chronic kidney disease), stage II-III    COPD (chronic obstructive pulmonary disease) (HCC)    Diabetes mellitus without complication (HCC)    HFrEF (heart failure with reduced ejection fraction) ([place])    Hypertension    Mitral regurgitation    Mild to moderate by October 2021 echocardiogram.   Mixed Ischemic & NICM (nonischemic cardiomyopathy) ([place])    a. 03/2015 Echo: EF 45-50%; b. 12/2015 Echo: EF 20-25%; c. 02/2016 Echo: EF 30-35%; d. 11/2016 Echo: EF 40-45%; e. 06/2019 Echo: EF 30-35%; f. 11/2019 Echo: EF 25-30%; g. 05/2020 Echo: EF 35-40%, glob HK, nl RV size/fxn, PASP 44.78mmHg, mod dil LA. Mild to mod MR.   Myocardial infarct (HCC)    NSVT (nonsustained ventricular tachycardia)    a. 12/2015 noted on tele-->amio;  b. 12/2015 Event  monitor: no VT noted.   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  12/31/19: RUL and RLL PEs.   Syncope    a. 01/2016 - felt to be vasovagal.    ASSESSMENT 51 year old male who presents to the HF clinic on January/05/2022 for  follow up after hospitalization. Patient admits SOB but associated current event to respiratory issues and not HF. Reports abdominal swelling , and feeling full "all the time". Noted patient on GLP1, but felling full worsened in the last week. Patient feels him furosemide 40mg  is not working nay better than torsemide 20mg  and noticed increased urination while sleeping.   Blood pressure alos elevated at home (~518 systolic per patient report). Noted AKI with previous attempt to increase Entresto or add SGLT2i to current regimen.  Recent ED Visit (past 6 months): Date - 08/09/21, [facility] - Shortness of breath  PLAN  Dose too low/additional therapy needed: Diuresis with increased dose furosemide and metolazone x 2 days,then furosemide 60mg  daily Patient is to work on further decreasing sodium from diet , and increase walking to 10-15 min twice weekly  Dose too low: Increase carvedilol to 12.5mg  BID  Time spent: 30 minutes  Gabriel [name] PharmD, BCPS 08/28/2021 9:12 AM    Current Outpatient Medications:    carvedilol (COREG) 6.25 MG tablet, Take 1 tablet (6.25 mg total) by mouth 2 (two) times daily with a meal. (Patient taking differently: Take 12.5 mg by mouth 2 (two) times daily with a meal.), Disp: 180 tablet, Rfl: 3   Dulaglutide (TRULICITY) 8.41 YS/0.6TK SOPN, Inject 0.75 mg into the skin once a week. (Patient taking differently: Inject 0.75 mg into the skin once a week.), Disp: 2 mL, Rfl: 2   ezetimibe (ZETIA) 10 MG tablet, Take 1 tablet (10 mg total) by mouth daily., Disp: 90 tablet, Rfl: 3   fenofibrate (TRICOR) 145 MG tablet, TAKE 1 TABLET BY MOUTH EVERY DAY, Disp: 30 tablet, Rfl: 6   furosemide (LASIX) 40 MG tablet, Take 1 tablet (40 mg total) by mouth daily. (Patient taking differently: Take 60 mg by mouth daily.), Disp: 30 tablet, Rfl: 0   Insulin Glargine (BASAGLAR KWIKPEN) 100 UNIT/ML, Inject 28 Units into the skin at bedtime. (Patient not taking: Reported on 08/28/2021),  Disp: 8.4 mL, Rfl: 2   insulin lispro (HUMALOG) 100 UNIT/ML KwikPen, Inject 3 Units into the skin 3 (three) times daily., Disp: 2.7 mL, Rfl: 2   Ipratropium-Albuterol (COMBIVENT RESPIMAT) 20-100 MCG/ACT AERS respimat, Inhale 1 puff into the lungs every 6 (six) hours as needed for wheezing. (Patient not taking: Reported on 08/28/2021), Disp: , Rfl:    metolazone (ZAROXOLYN) 5 MG tablet, TAKE 1 TABLET (5 MG TOTAL) BY MOUTH DAILY AS NEEDED (FOR WEIGHT OVER 245)., Disp: 30 tablet, Rfl: 6   nitroGLYCERIN (NITROSTAT) 0.4 MG SL tablet, Place 1 tablet (0.4 mg total) under the tongue every 5 (five) minutes x 3 doses as needed for chest pain. (Patient not taking: Reported on 08/28/2021), Disp: 30 tablet, Rfl: 0   potassium chloride SA (KLOR-CON M20) 20 MEQ tablet, Take 1 tablet daily, Disp: 30 tablet, Rfl: 6   rivaroxaban (XARELTO) 20 MG TABS tablet, Take 1 tablet (20 mg total) by mouth daily with supper., Disp: 90 tablet, Rfl: 0   rosuvastatin (CRESTOR) 20 MG tablet, TAKE 1 TABLET BY MOUTH EVERYDAY AT BEDTIME, Disp: 30 tablet, Rfl: 6   sacubitril-valsartan (ENTRESTO) 24-26 MG, Take 1  tablet by mouth 2 (two) times daily., Disp: , Rfl:    COUNSELING POINTS/CLINICAL PEARLS  Carvedilol (Goal: 25-50 mg twice daily)  Patient should avoid activities requiring mental alertness or coordination until drug effects are realized, as drug may cause dizziness.  This drug may cause bradyarrhythmia, diarrhea, nausea, vomiting, arthralgia, back pain, myalgia, headache, vision disorder, erectile dysfunction, reduced libido, or fatigue.  Instruct patient to report signs/symptoms of adverse cardiovascular effects such as hypotension (especially in elderly patients), arrhythmias, syncope, palpitations, angina, or edema.  Drug may mask symptoms of hypoglycemia. Advise diabetic patients to carefully monitor blood sugar levels.  Patient should take drug with food.  Advise patient against sudden discontinuation of drug. Instruct  patient to take a missed dose as soon as possible, but if next dose is in less than 8 h, skip the missed dose.  Tell patient planning major surgery with anesthesia to alert physician that drug is being used, as drug impairs ability of heart to respond to reflex adrenergic stimuli.  Patient should take extended-release tablet with or immediately following meals.   Sacubitril-valsartan (Goal: 97-103 mg twice daily)  Side effects may include hyperkalemia, cough, dizziness, or renal failure.  Furosemide and metolazone  Patient should avoid activities requiring coordination until drug effects are realized, as drug may cause           dizziness, vertigo, or blurred vision. This drug may cause excessive urination, hyperglycemia, hyperuricemia, constipation, diarrhea, loss of appetite, nausea, vomiting, purpuric disorder, cramps, spasticity, asthenia, headache, paresthesia, or scaling eczema. Advise patient to report signs/symptoms of a severe skin reactions (flu-like symptoms, spreading red rash, or skin/mucous membrane blistering) or erythema multiforme. Instruct patient to eat high-potassium foods during drug therapy, as directed by healthcare professional.  Patient should not drink alcohol while taking this drug. Warn patient to avoid use of nonprescription NSAID products without first discussing it with their healthcare provider.

## 2021-08-30 ENCOUNTER — Other Ambulatory Visit (HOSPITAL_COMMUNITY): Payer: Self-pay | Admitting: Cardiology

## 2021-09-01 NOTE — Progress Notes (Deleted)
Patient ID: Natalio Salois., male    DOB: November 18, 1970, 51 y.o.   MRN: [id number]  HPI  Mr Molinari is a 51 y/o male with a history of HTN, diabetes, COPD, CKD, asthma, previous tobacco use and chronic heart failure.   Echo results from 05/21/21 reviewed and showed an EF of 40-45% along with mild LVH and mild MR. Echo report from 05/21/21 reviewed and showed an EF of 40-45% along with mild LVH and mild MR. Echo report from 06/08/20 reviewed and showed an EF of 35-40% along with mildly elevated PA pressure of 44.7 mmHg, moderate LAE and mild/moderate MR. Echo report from 11/23/19 reviewed and showed an EF of 25-30% with mild LVH and moderately elevated PA Pressure. Echo report from 06/26/2019 reviewed and showed an EF of 30-35% along with moderately elevated PA pressure. Echo done 04/29/17 showed EF 45-50%. Echo report that was done 11/28/16 and it showed an EF of 40-45% along with mild MR. EF has improved from 30-35% July 2017.  RHC/LHC done 06/13/20 and showed: Severe single-vessel coronary artery disease with chronic total occlusion of the proximal RCA.  The distal vessel fills via left to right collaterals. Mild to moderate, nonobstructive CAD involving the LAD and LCx with up to 50% stenosis. Mildly elevated left and right heart filling pressures. Moderate pulmonary hypertension (mean PAP 40 mmHg) with significantly elevated pulmonary vascular resistance (PVR 7.4 WU). Severely reduced Fick cardiac output/index.  Admitted 08/09/21 due to acute on chronic heart failure. Initially given IV lasix with transition to oral diuretics. Entresto held due to AKI but then resumed prior to discharge. Discharged after 4 days. Admitted 05/20/21 due to chest pain and shortness of breath. BNP elevated so IV lasix given. Cardiology consult obtained. Entresto and IV lasix had to be held due to rising creatinine. Chronically elevated troponins. Discharged after 3 days.   He presents today for a follow-up visit with a  chief complaint of   Says that he's being referred back to the [facility] for consideration of cardiomems.   Past Medical History:  Diagnosis Date   CAD (coronary artery disease)    a. 04/2015 low risk MV;  b. 12/2016 Cath: minor irregs in LAD/Diag/LCX/OM, RCA 40p/m/d; c. 05/2020 Cath: LM nl, LAD 50d, LCX 30p, OM1 40, RCA 100p w/ L->L collats. CO/CI 3.1/1.3-->Med rx.   Chest wall pain, chronic    CHF (congestive heart failure) (HCC)    Chronic Troponin Elevation    CKD (chronic kidney disease), stage II-III    COPD (chronic obstructive pulmonary disease) (HCC)    Diabetes mellitus without complication (HCC)    HFrEF (heart failure with reduced ejection fraction) ([place])    Hypertension    Mitral regurgitation    Mild to moderate by October 2021 echocardiogram.   Mixed Ischemic & NICM (nonischemic cardiomyopathy) ([place])    a. 03/2015 Echo: EF 45-50%; b. 12/2015 Echo: EF 20-25%; c. 02/2016 Echo: EF 30-35%; d. 11/2016 Echo: EF 40-45%; e. 06/2019 Echo: EF 30-35%; f. 11/2019 Echo: EF 25-30%; g. 05/2020 Echo: EF 35-40%, glob HK, nl RV size/fxn, PASP 44.45mmHg, mod dil LA. Mild to mod MR.   Myocardial infarct (HCC)    NSVT (nonsustained ventricular tachycardia)    a. 12/2015 noted on tele-->amio;  b. 12/2015 Event monitor: no VT noted.   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  12/31/19: RUL and RLL PEs.   Syncope  a. 01/2016 - felt to be vasovagal.   Past Surgical History:  Procedure Laterality Date   AMPUTATION     CARDIAC CATHETERIZATION     FINGER AMPUTATION     Traumatic   FINGER FRACTURE SURGERY Left    LEFT HEART CATH AND CORONARY ANGIOGRAPHY N/A 01/06/2017   Procedure: Left Heart Cath and Coronary Angiography;  Surgeon: Wellington Hampshire, MD;  Location: [facility];  Service: Cardiovascular;  Laterality: N/A;   RIGHT/LEFT HEART CATH AND CORONARY ANGIOGRAPHY N/A 06/13/2020   Procedure: RIGHT/LEFT HEART CATH AND  CORONARY ANGIOGRAPHY;  Surgeon: Nelva Bush, MD;  Location: [facility];  Service: Cardiovascular;  Laterality: N/A;   Family History  Problem Relation Age of Onset   Diabetes Mother    Diabetes Mellitus II Mother    Hypothyroidism Mother    Hypertension Mother    Kidney failure Mother        Dialysis   Heart attack Mother        90 yo approximately   Hypertension Father    Gout Father    Cancer Maternal Grandfather    Diabetes Maternal Grandfather    Cancer Paternal Aunt    Social History   Tobacco Use   Smoking status: Former    Packs/day: 0.50    Years: 33.00    Pack years: 16.50    Types: Cigarettes    Quit date: 05/08/2020    Years since quitting: 1.3   Smokeless tobacco: Former    Quit date: 05/08/2020   Tobacco comments:    Quit Sept 2021  Substance Use Topics   Alcohol use: Yes    Alcohol/week: 0.0 standard drinks    Comment: occassionally   Allergies  Allergen Reactions   Metformin And Related Nausea And Vomiting   Prednisone Other (See Comments)    Reaction: Hallucinations     Zantac [Ranitidine Hcl] Diarrhea and Nausea Only    Night sweats     Review of Systems  Constitutional:  Positive for fatigue (easily). Negative for appetite change.  HENT:  Positive for postnasal drip. Negative for congestion and sore throat.   Eyes: Negative.   Respiratory:  Positive for cough and shortness of breath (worsening). Negative for chest tightness and wheezing.   Cardiovascular:  Positive for chest pain (intermittent). Negative for palpitations and leg swelling.  Gastrointestinal:  Positive for abdominal distention. Negative for abdominal pain.  Endocrine: Negative.   Genitourinary: Negative.   Musculoskeletal:  Positive for arthralgias (right foot).  Skin: Negative.   Allergic/Immunologic: Negative.   Neurological:  Negative for dizziness and light-headedness.  Hematological:  Negative for adenopathy. Does not bruise/bleed easily.   Psychiatric/Behavioral:  Negative for dysphoric mood and sleep disturbance (sleeping on 1 pillow). The patient is not nervous/anxious.       Physical Exam Vitals and nursing note reviewed.  Constitutional:      Appearance: He is well-developed.  HENT:     Head: Normocephalic and atraumatic.  Neck:     Vascular: No JVD.  Cardiovascular:     Rate and Rhythm: Normal rate and regular rhythm.  Pulmonary:     Effort: Pulmonary effort is normal.     Breath sounds: No wheezing or rales.  Chest:     Chest wall: No tenderness.  Abdominal:     General: There is distension.     Tenderness: There is no abdominal tenderness.  Musculoskeletal:        General: Tenderness (right heel area) present.  Cervical back: Normal range of motion and neck supple.     Right lower leg: No edema.     Left lower leg: No edema.  Skin:    General: Skin is warm and dry.  Neurological:     Mental Status: He is alert and oriented to person, place, and time.  Psychiatric:        Behavior: Behavior normal.        Thought Content: Thought content normal.   Assessment & Plan:  1: Acute on Chronic heart failure with reduced ejection fraction- - NYHA class III - moderately fluid overloaded today with weight gain, abdominal distention and worsening SOB - weighing daily and reminded to call for an overnight weight gain of >2 pounds or a weekly weight gain of >5 pounds - weight 251.2 pounds since last visit here 6 days ago - increased his daily furosemide to 60mg  daily (1.5 tablet) at last visit along with metolazone daily for 2 days - check BMP today - not adding salt and he was encouraged to closely follow a 2000mg  sodium diet - saw cardiology Kathlen Mody) 06/18/21 - on GDMT of jardiance, carvedilol & entresto - carvedilol increased at last visit - saw EP Caryl Comes) 09/02/19; discussion about ICD  - saw provider at [facility] 07/06/20; being referred back to them for consideration of cardiomems - BNP  from 08/09/21 was 487.8  2: HTN- - BP  - saw PCP @ [facility] 04/13/20; has medicaid now & has PCP appt currently scheduled at [facility] on 09/05/21 - BMP from 08/13/21 reviewed and showed sodium 134, potassium 4.2, creatinine 1.43 and GFR 59  3: Diabetes-   -  A1c 8.1% on 03/28/21 - glucose today was  - completed some classes at the [facility]   Patient did not bring his medications nor a list. Each medication was verbally reviewed with the patient and he was encouraged to bring the bottles to every visit to confirm accuracy of list.

## 2021-09-03 ENCOUNTER — Ambulatory Visit: Payer: Medicaid Other | Admitting: Family

## 2021-09-05 ENCOUNTER — Other Ambulatory Visit: Payer: Self-pay

## 2021-09-05 ENCOUNTER — Telehealth: Payer: Self-pay | Admitting: *Deleted

## 2021-09-05 ENCOUNTER — Ambulatory Visit (INDEPENDENT_AMBULATORY_CARE_PROVIDER_SITE_OTHER): Payer: BC Managed Care – PPO | Admitting: Family Medicine

## 2021-09-05 ENCOUNTER — Telehealth: Payer: Self-pay

## 2021-09-05 ENCOUNTER — Encounter: Payer: Self-pay | Admitting: Family Medicine

## 2021-09-05 VITALS — BP 113/69 | HR 75 | Resp 16 | Wt 243.3 lb

## 2021-09-05 DIAGNOSIS — E782 Mixed hyperlipidemia: Secondary | ICD-10-CM | POA: Insufficient documentation

## 2021-09-05 DIAGNOSIS — R413 Other amnesia: Secondary | ICD-10-CM | POA: Diagnosis not present

## 2021-09-05 DIAGNOSIS — Z09 Encounter for follow-up examination after completed treatment for conditions other than malignant neoplasm: Secondary | ICD-10-CM | POA: Insufficient documentation

## 2021-09-05 DIAGNOSIS — I25118 Atherosclerotic heart disease of native coronary artery with other forms of angina pectoris: Secondary | ICD-10-CM | POA: Diagnosis not present

## 2021-09-05 DIAGNOSIS — J439 Emphysema, unspecified: Secondary | ICD-10-CM

## 2021-09-05 DIAGNOSIS — M79671 Pain in right foot: Secondary | ICD-10-CM | POA: Insufficient documentation

## 2021-09-05 DIAGNOSIS — E785 Hyperlipidemia, unspecified: Secondary | ICD-10-CM

## 2021-09-05 DIAGNOSIS — E1169 Type 2 diabetes mellitus with other specified complication: Secondary | ICD-10-CM | POA: Insufficient documentation

## 2021-09-05 DIAGNOSIS — I5023 Acute on chronic systolic (congestive) heart failure: Secondary | ICD-10-CM | POA: Diagnosis not present

## 2021-09-05 LAB — POCT GLYCOSYLATED HEMOGLOBIN (HGB A1C): Hemoglobin A1C: 8.1 % — AB (ref 4.0–5.6)

## 2021-09-05 MED ORDER — FENOFIBRATE 145 MG PO TABS: 145.0000 mg | ORAL_TABLET | Freq: Every day | ORAL | 3 refills | Status: DC

## 2021-09-05 MED ORDER — PEN NEEDLES 32G X 4 MM MISC: 200.0000 | Freq: Every day | 3 refills | Status: DC

## 2021-09-05 MED ORDER — METOLAZONE 5 MG PO TABS: ORAL_TABLET | ORAL | 6 refills | Status: DC

## 2021-09-05 MED ORDER — SACUBITRIL-VALSARTAN 24-26 MG PO TABS: 1.0000 | ORAL_TABLET | Freq: Two times a day (BID) | ORAL | 3 refills | Status: DC

## 2021-09-05 MED ORDER — TRULICITY 0.75 MG/0.5ML ~~LOC~~ SOAJ: 0.7500 mg | SUBCUTANEOUS | 3 refills | Status: DC

## 2021-09-05 MED ORDER — ROSUVASTATIN CALCIUM 20 MG PO TABS: 20.0000 mg | ORAL_TABLET | Freq: Every day | ORAL | 3 refills | Status: DC

## 2021-09-05 MED ORDER — FUROSEMIDE 40 MG PO TABS: 60.0000 mg | ORAL_TABLET | Freq: Every day | ORAL | 3 refills | Status: DC

## 2021-09-05 MED ORDER — BASAGLAR KWIKPEN 100 UNIT/ML ~~LOC~~ SOPN: 28.0000 [IU] | PEN_INJECTOR | Freq: Every evening | SUBCUTANEOUS | 3 refills | Status: DC

## 2021-09-05 NOTE — Assessment & Plan Note (Signed)
Goal of LDL <70 Continue low fat diet Refill provided

## 2021-09-05 NOTE — Assessment & Plan Note (Signed)
Reinforce low fat diet Refills tricor provided

## 2021-09-05 NOTE — Assessment & Plan Note (Signed)
Referral to podiatry No acute pain today

## 2021-09-05 NOTE — Assessment & Plan Note (Signed)
Noted since 2016 Unsure what vaccines that he has taken Denies difficulty with daily tasks Referral to neuro at later date if needed

## 2021-09-05 NOTE — Assessment & Plan Note (Signed)
In arch- prev hx of steel toe boots at work Referral to pod given DM

## 2021-09-05 NOTE — Chronic Care Management (AMB) (Signed)
°  Care Management   Note  09/05/2021 Name: Gabriel [name]. MRN: [id number] DOB: 01/29/1971  [name]. is a 51 y.o. year old male who is a primary care patient of [name], FNP. I reached out to [employer]. by phone today in response to a referral sent by Mr. NATALIO SALOIS Jr.'s primary care provider.   Mr. Felicetti was given information about care management services today including:  Care management services include personalized support from designated clinical staff supervised by his physician, including individualized plan of care and coordination with other care providers 24/7 contact phone numbers for assistance for urgent and routine care needs. The patient may stop care management services at any time by phone call to the office staff.  Patient agreed to services and verbal consent obtained.   Follow up plan: Telephone appointment with care management team member scheduled for: 09/11/2021  Julian Hy, [facility]  Direct Dial: [phone]

## 2021-09-05 NOTE — Progress Notes (Signed)
Established patient visit   Patient: Gabriel [name].   DOB: 1970-11-04   51 y.o. Male  MRN: [id number] Visit Date: 09/05/2021  Today's healthcare provider: [name], FNP   Chief Complaint  Patient presents with   Hospitalization Follow-up   Subjective    HPI  Follow up Hospitalization  Patient was admitted to [facility] on 08/09/21 and discharged on 08/13/21. He was treated for acute on chronic systolic ( congestive) heart failure. Treatment for this included Lasix 40mg , Metolazone PRN and scheduled for follow up with cardiology. Patient states that he is now up to 60mg  on Lasix and [name] is his cardiologist Telephone follow up was done on 08/14/21 He reports good compliance with treatment. He reports this condition is stayed the same. Patient still reports dyspnea and fatigue.  A1c checked today- pt has been out of medications for >5 weeks; education provided on when to follow up with PCP if medication refills are needed ----------------------------------------------------------------------------------------  Medications: Outpatient Medications Prior to Visit  Medication Sig   carvedilol (COREG) 6.25 MG tablet Take 1 tablet (6.25 mg total) by mouth 2 (two) times daily with a meal. Needs appt for further refills   ezetimibe (ZETIA) 10 MG tablet Take 1 tablet (10 mg total) by mouth daily.   insulin lispro (HUMALOG) 100 UNIT/ML KwikPen Inject 3 Units into the skin 3 (three) times daily.   Ipratropium-Albuterol (COMBIVENT RESPIMAT) 20-100 MCG/ACT AERS respimat Inhale 1 puff into the lungs every 6 (six) hours as needed for wheezing.   nitroGLYCERIN (NITROSTAT) 0.4 MG SL tablet Place 1 tablet (0.4 mg total) under the tongue every 5 (five) minutes x 3 doses as needed for chest pain.   potassium chloride SA (KLOR-CON M20) 20 MEQ tablet Take 1 tablet daily   rivaroxaban (XARELTO) 20 MG TABS tablet Take 1 tablet (20 mg total) by mouth daily with supper.   [DISCONTINUED]  Dulaglutide (TRULICITY) 6.28 ZM/6.2HU SOPN Inject 0.75 mg into the skin once a week. (Patient taking differently: Inject 0.75 mg into the skin once a week.)   [DISCONTINUED] fenofibrate (TRICOR) 145 MG tablet TAKE 1 TABLET BY MOUTH EVERY DAY (Patient not taking: Reported on 09/05/2021)   [DISCONTINUED] furosemide (LASIX) 40 MG tablet Take 1 tablet (40 mg total) by mouth daily. (Patient not taking: Reported on 09/05/2021)   [DISCONTINUED] Insulin Glargine (BASAGLAR KWIKPEN) 100 UNIT/ML Inject 28 Units into the skin at bedtime. (Patient not taking: Reported on 09/05/2021)   [DISCONTINUED] metolazone (ZAROXOLYN) 5 MG tablet TAKE 1 TABLET (5 MG TOTAL) BY MOUTH DAILY AS NEEDED (FOR WEIGHT OVER 245). (Patient not taking: Reported on 09/05/2021)   [DISCONTINUED] rosuvastatin (CRESTOR) 20 MG tablet TAKE 1 TABLET BY MOUTH EVERYDAY AT BEDTIME (Patient not taking: Reported on 09/05/2021)   [DISCONTINUED] sacubitril-valsartan (ENTRESTO) 24-26 MG Take 1 tablet by mouth 2 (two) times daily. (Patient not taking: Reported on 09/05/2021)   No facility-administered medications prior to visit.    Review of Systems     Objective    BP 113/69    Pulse 75    Resp 16    Wt 243 lb 4.8 oz (110.4 kg)    SpO2 97%    BMI 33.00 kg/m    Physical Exam Vitals and nursing note reviewed.  Constitutional:      Appearance: Normal appearance. He is obese.  HENT:     Head: Normocephalic and atraumatic.  Eyes:     Pupils: Pupils are equal, round, and reactive to light.  Cardiovascular:     Rate and Rhythm: Normal rate and regular rhythm.     Pulses: Normal pulses.     Heart sounds: Normal heart sounds.  Pulmonary:     Effort: Pulmonary effort is normal.     Breath sounds: Normal breath sounds.  Musculoskeletal:        General: Normal range of motion.     Cervical back: Normal range of motion.  Skin:    General: Skin is warm and dry.     Capillary Refill: Capillary refill takes less than 2 seconds.  Neurological:      General: No focal deficit present.     Mental Status: He is alert and oriented to person, place, and time. Mental status is at baseline.  Psychiatric:        Mood and Affect: Mood normal.        Behavior: Behavior normal.        Thought Content: Thought content normal.        Judgment: Judgment normal.      Results for orders placed or performed in visit on 09/05/21  POCT glycosylated hemoglobin (Hb A1C)  Result Value Ref Range   Hemoglobin A1C 8.1 (A) 4.0 - 5.6 %   HbA1c POC (<> result, manual entry)     HbA1c, POC (prediabetic range)     HbA1c, POC (controlled diabetic range)      Assessment & Plan     Problem List Items Addressed This Visit       Cardiovascular and Mediastinum   Acute on chronic HFrEF (heart failure with reduced ejection fraction) (HCC)    Weight is stabilizing BP is stabilizing Denies CP Denies missed doses of meds      Relevant Medications   fenofibrate (TRICOR) 145 MG tablet   furosemide (LASIX) 40 MG tablet   rosuvastatin (CRESTOR) 20 MG tablet   sacubitril-valsartan (ENTRESTO) 24-26 MG   metolazone (ZAROXOLYN) 5 MG tablet   Atherosclerotic heart disease of native coronary artery with other forms of angina pectoris (HCC)    Previous MI in 2016       Relevant Medications   fenofibrate (TRICOR) 145 MG tablet   furosemide (LASIX) 40 MG tablet   rosuvastatin (CRESTOR) 20 MG tablet   sacubitril-valsartan (ENTRESTO) 24-26 MG   metolazone (ZAROXOLYN) 5 MG tablet     Respiratory   COPD (chronic obstructive pulmonary disease) (HCC)    Has quit smoking Not on o2 Reports issues with seasonal allergies Denies vaccination for PNA today        Endocrine   Type 2 diabetes mellitus with hyperlipidemia (HCC)    Continue diet modification as well as triglyceride medications       Relevant Medications   Insulin Glargine (BASAGLAR KWIKPEN) 100 UNIT/ML   fenofibrate (TRICOR) 145 MG tablet   Dulaglutide (TRULICITY) 7.51 ZG/0.1VC SOPN   furosemide  (LASIX) 40 MG tablet   rosuvastatin (CRESTOR) 20 MG tablet   sacubitril-valsartan (ENTRESTO) 24-26 MG   metolazone (ZAROXOLYN) 5 MG tablet   Insulin Pen Needle (PEN NEEDLES) 32G X 4 MM MISC   Other Relevant Orders   AMB Referral to [place]   POCT glycosylated hemoglobin (Hb A1C) (Completed)   Hyperlipidemia associated with type 2 diabetes mellitus (HCC)    Goal of LDL <70 Continue low fat diet Refill provided      Relevant Medications   Insulin Glargine (BASAGLAR KWIKPEN) 100 UNIT/ML   fenofibrate (TRICOR) 145 MG tablet   Dulaglutide (TRULICITY) 9.44  MG/0.5ML SOPN   furosemide (LASIX) 40 MG tablet   rosuvastatin (CRESTOR) 20 MG tablet   sacubitril-valsartan (ENTRESTO) 24-26 MG   metolazone (ZAROXOLYN) 5 MG tablet     Other   Right foot pain    In arch- prev hx of steel toe boots at work Referral to pod given DM      Relevant Orders   Ambulatory referral to Podiatry   Arch pain of right foot    Referral to podiatry No acute pain today      Relevant Orders   Ambulatory referral to Podiatry   Elevated triglycerides with high cholesterol    Reinforce low fat diet Refills tricor provided      Relevant Medications   fenofibrate (TRICOR) 145 MG tablet   furosemide (LASIX) 40 MG tablet   rosuvastatin (CRESTOR) 20 MG tablet   sacubitril-valsartan (ENTRESTO) 24-26 MG   metolazone (ZAROXOLYN) 5 MG tablet   Memory loss or impairment    Noted since 2016 Unsure what vaccines that he has taken Denies difficulty with daily tasks Referral to neuro at later date if needed      Hospital discharge follow-up - Primary    Recent chf exacerbation Denies known symptoms leading to event Denies missed medications or abnormal food intake/salt intake        Return in about 4 weeks (around 10/03/2021) for annual examination.      Vonna Kotyk, FNP, have reviewed all documentation for this visit. The documentation on 09/05/21 for the exam, diagnosis,  procedures, and orders are all accurate and complete.  Patient seen and examined by Tally Joe,  FNP note scribed by [employer], [place], [place] [phone] (phone) [phone] (fax)  [place]

## 2021-09-05 NOTE — Assessment & Plan Note (Signed)
Continue diet modification as well as triglyceride medications

## 2021-09-05 NOTE — Assessment & Plan Note (Signed)
Weight is stabilizing BP is stabilizing Denies CP Denies missed doses of meds

## 2021-09-05 NOTE — Assessment & Plan Note (Signed)
Recent chf exacerbation Denies known symptoms leading to event Denies missed medications or abnormal food intake/salt intake

## 2021-09-05 NOTE — Assessment & Plan Note (Signed)
Has quit smoking Not on o2 Reports issues with seasonal allergies Denies vaccination for PNA today

## 2021-09-05 NOTE — Telephone Encounter (Signed)
Patient was seen in office today by Gabriel [name] is requesting that his PCP be switched to Dr. Brita Romp, I did let him know she is not accepting new patients at this time but I will forward to team for review. Please review. KW

## 2021-09-05 NOTE — Assessment & Plan Note (Signed)
Previous MI in 2016

## 2021-09-06 NOTE — Telephone Encounter (Signed)
Judson Roch, would you do service recovery for this one. I cannot take on additional patients. Maybe he could see Ria Comment instead?

## 2021-09-09 NOTE — Progress Notes (Deleted)
Patient ID: Gabriel Gaida., male   DOB: [birth year], 51 y.o.   MRN: [id number] Cardiology Office Note  Date:  09/09/2021   ID:  Gabriel Holloway., DOB [birth year], MRN [id number]  PCP:  [name], FNP   No chief complaint on file.   HPI:  51 y.o. male with h/o  nonischemic cardiomyopathy by nuclear stress test in 2016, OSA, did not tolerate CPAP History of medication noncompliance History of chronic chest pain [facility] on 01/03/16 with cough and SOB, septic shock, right upper lobe pneumonia, neurology thinks her water Left heart cath 01/06/2017  left ventricular ejection fraction  25-35% by visual estimate. chronic systolic CHF, Follow-up echocardiograms ejection fraction 30-35%, up to 45% - 50% with medical management COPD, long smoking history Prior runs of nonsustained VT, syncope DM2, med noncompliance, Morbid obesity Cath 05/2020: occluded RCA with collaterals He presents today for follow-up of his nonischemic cardiomyopathy, chronic systolic CHF, weakness, dehydration  Last seen by myself August 2022 admitted to [facility] in 03/2021 with abdominal pain with imaging showing concern for a calculus cholecystitis.  General surgery initially plan on cholecystectomy however reconsidered and pursued HIDA scan. HIDA scan was negative and the cystic duct was patent, therefore making cholecystectomy unlikely needed. With this, he left AMA  Followed by CHF clinic  Seen by one of the providers in our clinic October 2022 Hospitalized 10/3-10/5 for chest pain with minimally elevted trop and BNP >2000. He was treated with IV lasix.    Frequent trips to the emergency room for rib pain, COPD exacerbation, CHF, abdominal pain 7 emergency room trips this year He was admitted March 28, 2021 abdominal pain, left AMA He was placed on IV antibiotics, pain control, abdomen was imaged HIDA scan negative He desired surgical intervention and left AMA  3 days later presented to [facility] ER They told  him he had fluid in his abdomen from heart failure causing his pain Was told to take his torsemide and was discharged from the emergency room  Weight 254 "Should be 236" he feels Abdomen distended, not having significant leg swelling Has not been adjusting his torsemide based on his weight, continues to take torsemide 20 mg daily High fluid intake Attributes weight gain to being recently in the hospital twice as detailed above  On discussion of his diuretics, when I last saw him 1 year ago he was on torsemide 40 twice daily, now only taking 20 daily Reports that he got dehydrated once and was told to decrease his torsemide  Working at [employer], does not feel that he can work today or through the weekend given his shortness of breath symptoms  Does not have any metolazone at home He is requesting IV Lasix  EKG personally reviewed by myself on todays visit Normal sinus rhythm rate 83 bpm nonspecific ST abnormality  Other past medical history reviewed  echo 11/23/2019:  1. Left ventricular ejection fraction, by estimation, is 25 to 30%. The  left ventricle has severely decreased function. The left ventricle  demonstrates global hypokinesis. The left ventricular internal cavity size  was mildly dilated. There is mild left  ventricular hypertrophy. Left ventricular diastolic parameters are  consistent with Grade III diastolic dysfunction (restrictive). Elevated  left atrial pressure.   Other past medical history reviewed Seen in the emergency room 11/16/2017 Chronic chest pain shortness of breath Low back pain pain radiating down left leg Normal renal function Emergency room told him that they do not do outpatient workup and do  not provide pain medication  Left heart cath 01/06/2017 Nonobstructive coronary disease, 40% in RCA  left ventricular ejection fraction  25-35% by visual estimate.   Reports having sleep study in the past, was positive, did not tolerate CPAP.  Does not  want to wear that, feels he sleeps fine    PMH:   has a past medical history of CAD (coronary artery disease), Chest wall pain, chronic, CHF (congestive heart failure) (HCC), Chronic Troponin Elevation, CKD (chronic kidney disease), stage II-III, COPD (chronic obstructive pulmonary disease) ([place]), Diabetes mellitus without complication ([place]), HFrEF (heart failure with reduced ejection fraction) ([place]), Hypertension, Mitral regurgitation, Mixed Ischemic & NICM (nonischemic cardiomyopathy) ([place]), Myocardial infarct ([place]), NSVT (nonsustained ventricular tachycardia), Obesity (BMI 30.0-34.9), Psoriasis, Recurrent pulmonary emboli ([place]) (06/07/2020), and Syncope.  PSH:    Past Surgical History:  Procedure Laterality Date   AMPUTATION     CARDIAC CATHETERIZATION     FINGER AMPUTATION     Traumatic   FINGER FRACTURE SURGERY Left    LEFT HEART CATH AND CORONARY ANGIOGRAPHY N/A 01/06/2017   Procedure: Left Heart Cath and Coronary Angiography;  Surgeon: Wellington Hampshire, MD;  Location: [place] [facility];  Service: Cardiovascular;  Laterality: N/A;   RIGHT/LEFT HEART CATH AND CORONARY ANGIOGRAPHY N/A 06/13/2020   Procedure: RIGHT/LEFT HEART CATH AND CORONARY ANGIOGRAPHY;  Surgeon: Nelva Bush, MD;  Location: [place] [facility];  Service: Cardiovascular;  Laterality: N/A;    Current Outpatient Medications  Medication Sig Dispense Refill   carvedilol (COREG) 6.25 MG tablet Take 1 tablet (6.25 mg total) by mouth 2 (two) times daily with a meal. Needs appt for further refills 60 tablet 0   Dulaglutide (TRULICITY) 8.41 LK/4.4WN SOPN Inject 0.75 mg into the skin once a week. 6 mL 3   ezetimibe (ZETIA) 10 MG tablet Take 1 tablet (10 mg total) by mouth daily. 90 tablet 3   fenofibrate (TRICOR) 145 MG tablet Take 1 tablet (145 mg total) by mouth daily. 90 tablet 3   furosemide (LASIX) 40 MG tablet Take 1.5 tablets (60 mg total) by mouth daily. 135 tablet 3   Insulin Glargine (BASAGLAR KWIKPEN) 100  UNIT/ML Inject 28 Units into the skin at bedtime. 27 mL 3   insulin lispro (HUMALOG) 100 UNIT/ML KwikPen Inject 3 Units into the skin 3 (three) times daily. 2.7 mL 2   Insulin Pen Needle (PEN NEEDLES) 32G X 4 MM MISC 200 each by Does not apply route at bedtime. 200 each 3   Ipratropium-Albuterol (COMBIVENT RESPIMAT) 20-100 MCG/ACT AERS respimat Inhale 1 puff into the lungs every 6 (six) hours as needed for wheezing.     metolazone (ZAROXOLYN) 5 MG tablet TAKE 1 TABLET (5 MG TOTAL) BY MOUTH DAILY AS NEEDED (FOR WEIGHT OVER 245). 30 tablet 6   nitroGLYCERIN (NITROSTAT) 0.4 MG SL tablet Place 1 tablet (0.4 mg total) under the tongue every 5 (five) minutes x 3 doses as needed for chest pain. 30 tablet 0   potassium chloride SA (KLOR-CON M20) 20 MEQ tablet Take 1 tablet daily 30 tablet 6   rivaroxaban (XARELTO) 20 MG TABS tablet Take 1 tablet (20 mg total) by mouth daily with supper. 90 tablet 0   rosuvastatin (CRESTOR) 20 MG tablet Take 1 tablet (20 mg total) by mouth daily. 90 tablet 3   sacubitril-valsartan (ENTRESTO) 24-26 MG Take 1 tablet by mouth 2 (two) times daily. 180 tablet 3   No current facility-administered medications for this visit.    Allergies:  Metformin and related, Prednisone, and Zantac [ranitidine hcl]   Social History:  The patient  reports that he has been smoking Cigarettes.  He has been smoking about 2.00 packs per day for the past 33 years. He has never used smokeless tobacco. He reports that he drinks about 1.8 oz of alcohol per week. He reports that he does not use illicit drugs.   Family History:   family history includes Cancer in his maternal grandfather and paternal aunt; Diabetes in his maternal grandfather and mother; Diabetes Mellitus II in his mother; Gout in his father; Heart attack in his mother; Hypertension in his father and mother; Hypothyroidism in his mother; Kidney failure in his mother.    Review of Systems: Review of Systems  Constitutional: Negative.         Abdominal distention  HENT: Negative.    Respiratory:  Positive for shortness of breath.   Cardiovascular: Negative.   Gastrointestinal: Negative.   Musculoskeletal:  Positive for back pain.  Psychiatric/Behavioral: Negative.    All other systems reviewed and are negative.   PHYSICAL EXAM: VS:  There were no vitals taken for this visit. , BMI There is no height or weight on file to calculate BMI. Constitutional:  oriented to person, place, and time. No distress.  HENT:  Head: Grossly normal Eyes:  no discharge. No scleral icterus.  Neck: No JVD, no carotid bruits  Cardiovascular: Regular rate and rhythm, no murmurs appreciated Pulmonary/Chest: Clear to auscultation bilaterally, no wheezes or rails Abdominal: Soft.  no distension.  no tenderness.  Musculoskeletal: Normal range of motion Neurological:  normal muscle tone. Coordination normal. No atrophy Skin: Skin warm and dry Psychiatric: normal affect, pleasant   Recent Labs: 11/23/2020: TSH 1.211 03/29/2021: ALT 17 08/09/2021: B Natriuretic Peptide 487.8 08/13/2021: BUN 35; Creatinine, Ser 1.43; Hemoglobin 18.1; Magnesium 2.1; Platelets 183; Potassium 4.2; Sodium 134    Lipid Panel Lab Results  Component Value Date   CHOL 185 11/23/2020   HDL 19 (L) 11/23/2020   LDLCALC UNABLE TO CALCULATE IF TRIGLYCERIDE OVER 400 mg/dL 11/23/2020   TRIG 900 (H) 11/23/2020      Wt Readings from Last 3 Encounters:  09/05/21 243 lb 4.8 oz (110.4 kg)  08/28/21 251 lb 2 oz (113.9 kg)  08/13/21 233 lb 4 oz (105.8 kg)      ASSESSMENT AND PLAN:   Cardiomyopathy ([place]) -Dilated, nonischemic Long history of medication noncompliance, Reports he is receiving his medications, he is on Medicaid Taking torsemide 20, has not titrated up on his torsemide despite significant weight gain approximately 15 to 20 pounds and abdominal distention On prior hospital admission torsemide was decreased down to 20 daily Stressed him the importance  of adjusting of his torsemide depending on his fluid intake, weight gain -Given his distress today we will give him IV Lasix 80 mg x 1 -We have recommended he take metolazone 5 mg with torsemide 60 mg approximately 30 minutes later starting tomorrow which is August 18 and August 19 Torsemide 40 mg daily over the weekend with no metolazone We will talk with him again on Monday morning August 22 to determine his weight and further medication options We have recommended fluid restriction  Essential hypertension Blood pressure elevated today Just took his morning medications, also blood pressure should improve with aggressive diuresis in the next several days  Chronic systolic heart failure (HCC) Acute exacerbation today with abdominal distention and shortness of breath symptoms, PND orthopnea Plan as above  coronary artery disease without  angina Previous cardiac catheterization with nonobstructive disease,  unrelated to his cardiomyopathy Smoking cessation recommended No further work-up at this time  Morbid obesity Also with likely underlying sleep apnea, previously declined CPAP Calorie restriction recommended   Total encounter time more than 35 minutes  Greater than 50% was spent in counseling and coordination of care with the patient     No orders of the defined types were placed in this encounter.    Signed, Esmond Plants, M.D., Ph.D. 09/09/2021  [place], [place]

## 2021-09-10 ENCOUNTER — Ambulatory Visit: Payer: Medicaid Other | Admitting: Family

## 2021-09-10 ENCOUNTER — Ambulatory Visit: Payer: Medicaid Other | Admitting: Cardiovascular Disease

## 2021-09-10 DIAGNOSIS — E782 Mixed hyperlipidemia: Secondary | ICD-10-CM

## 2021-09-10 DIAGNOSIS — I10 Essential (primary) hypertension: Secondary | ICD-10-CM

## 2021-09-10 DIAGNOSIS — I428 Other cardiomyopathies: Secondary | ICD-10-CM

## 2021-09-10 DIAGNOSIS — I5022 Chronic systolic (congestive) heart failure: Secondary | ICD-10-CM

## 2021-09-10 DIAGNOSIS — N1831 Chronic kidney disease, stage 3a: Secondary | ICD-10-CM

## 2021-09-10 DIAGNOSIS — E119 Type 2 diabetes mellitus without complications: Secondary | ICD-10-CM

## 2021-09-10 DIAGNOSIS — I2510 Atherosclerotic heart disease of native coronary artery without angina pectoris: Secondary | ICD-10-CM

## 2021-09-11 ENCOUNTER — Telehealth: Payer: BC Managed Care – PPO

## 2021-09-11 NOTE — Progress Notes (Deleted)
Chronic Care Management Pharmacy Note  09/11/2021 Name:  Melody Savidge. MRN:  [id number] DOB:  [birth year]  Summary: ***  Recommendations/Changes made from today's visit: ***  Plan: ***   Subjective: Aksh Swart. is an 51 y.o. year old male who is a primary patient of [name], FNP.  The CCM team was consulted for assistance with disease management and care coordination needs.    {CCMTELEPHONEFACETOFACE:21091510} for {CCMINITIALFOLLOWUPCHOICE:21091511} in response to provider referral for pharmacy case management and/or care coordination services.   Consent to Services:  {CCMCONSENTOPTIONS:25074}  Patient Care Team: [name], FNP as PCP - General (Family Medicine) Rockey Situ Kathlene November, MD as PCP - Cardiology (Cardiology) Alisa Graff, FNP as Nurse Practitioner (Family Medicine) Minna Merritts, MD as Consulting Physician (Cardiology) Germaine Pomfret, [facility] (Pharmacist)  Recent office visits: ***  Recent consult visits: [facility] visits: {Hospital DC Yes/No:25215}   Objective:  Lab Results  Component Value Date   CREATININE 1.43 (H) 08/13/2021   BUN 35 (H) 08/13/2021   GFRNONAA 59 (L) 08/13/2021   GFRAA >60 05/14/2020   NA 134 (L) 08/13/2021   K 4.2 08/13/2021   CALCIUM 8.9 08/13/2021   CO2 31 08/13/2021   GLUCOSE 290 (H) 08/13/2021    Lab Results  Component Value Date/Time   HGBA1C 8.1 (A) 09/05/2021 10:22 AM   HGBA1C 8.1 (H) 03/28/2021 11:26 AM   HGBA1C 7.1 (H) 10/27/2020 09:41 AM    Last diabetic Eye exam: No results found for: HMDIABEYEEXA  Last diabetic Foot exam: No results found for: HMDIABFOOTEX   Lab Results  Component Value Date   CHOL 185 11/23/2020   HDL 19 (L) 11/23/2020   LDLCALC UNABLE TO CALCULATE IF TRIGLYCERIDE OVER 400 mg/dL 11/23/2020   LDLDIRECT 60.6 11/23/2020   TRIG 900 (H) 11/23/2020   CHOLHDL 9.7 11/23/2020    Hepatic Function Latest Ref Rng & Units 03/29/2021 03/28/2021 02/19/2021  Total  Protein 6.5 - 8.1 g/dL 6.1(L) 6.3(L) 7.4  Albumin 3.5 - 5.0 g/dL 3.2(L) 3.3(L) 3.7  AST 15 - 41 U/L 14(L) 27 26  ALT 0 - 44 U/L 17 20 25   Alk Phosphatase 38 - 126 U/L 56 59 58  Total Bilirubin 0.3 - 1.2 mg/dL 1.4(H) 2.1(H) 1.9(H)  Bilirubin, Direct 0.0 - 0.2 mg/dL 0.2 0.4(H) -    Lab Results  Component Value Date/Time   TSH 1.211 11/23/2020 07:04 PM   TSH 1.740 09/09/2018 10:31 AM   TSH 2.456 10/01/2017 01:12 AM    CBC Latest Ref Rng & Units 08/13/2021 08/12/2021 08/11/2021  WBC 4.0 - 10.5 K/uL 10.9(H) 10.8(H) 9.8  Hemoglobin 13.0 - 17.0 g/dL 18.1(H) 17.1(H) 16.4  Hematocrit 39.0 - 52.0 % 53.6(H) 50.4 47.8  Platelets 150 - 400 K/uL 183 200 187    No results found for: VD25OH  Clinical ASCVD: {YES/NO:21197} The ASCVD Risk score (Arnett DK, et al., 2019) failed to calculate for the following reasons:   The patient has a prior MI or stroke diagnosis    Depression screen [facility] 2/9 08/28/2021 01/17/2021 06/23/2020  Decreased Interest 0 2 0  Down, Depressed, Hopeless 0 0 0  PHQ - 2 Score 0 2 0  Altered sleeping - 2 0  Tired, decreased energy - 3 1  Change in appetite - 2 0  Feeling bad or failure about yourself  - 0 0  Trouble concentrating - 1 0  Moving slowly or fidgety/restless - 0 0  Suicidal thoughts - 0  0  PHQ-9 Score - 10 1  Difficult doing work/chores - Somewhat difficult Not difficult at all  Some recent data might be hidden     ***Other: (CHADS2VASc if Afib, MMRC or CAT for COPD, ACT, DEXA)  Social History   Tobacco Use  Smoking Status Former   Packs/day: 0.50   Years: 33.00   Pack years: 16.50   Types: Cigarettes   Quit date: 05/08/2020   Years since quitting: 1.3  Smokeless Tobacco Former   Quit date: 05/08/2020  Tobacco Comments   Quit Sept 2021   BP Readings from Last 3 Encounters:  09/05/21 113/69  08/28/21 (!) 167/94  08/13/21 120/75   Pulse Readings from Last 3 Encounters:  09/05/21 75  08/28/21 88  08/13/21 73   Wt Readings from Last 3  Encounters:  09/05/21 243 lb 4.8 oz (110.4 kg)  08/28/21 251 lb 2 oz (113.9 kg)  08/13/21 233 lb 4 oz (105.8 kg)   BMI Readings from Last 3 Encounters:  09/05/21 33.00 kg/m  08/28/21 34.06 kg/m  08/13/21 31.63 kg/m    Assessment/Interventions: Review of patient past medical history, allergies, medications, health status, including review of consultants reports, laboratory and other test data, was performed as part of comprehensive evaluation and provision of chronic care management services.   SDOH:  (Social Determinants of Health) assessments and interventions performed: {yes/no:20286}  SDOH Screenings   Alcohol Screen: Not on file  Depression (PHQ2-9): Low Risk    PHQ-2 Score: 0  Financial Resource Strain: Not on file  Food Insecurity: Not on file  Housing: Not on file  Physical Activity: Not on file  Social Connections: Not on file  Stress: Not on file  Tobacco Use: Medium Risk   Smoking Tobacco Use: Former   Smokeless Tobacco Use: Former   Passive Exposure: Not on Pensions consultant Needs: Not on file    [place]  Allergies  Allergen Reactions   Metformin And Related Nausea And Vomiting   Prednisone Other (See Comments)    Reaction: Hallucinations     Zantac [Ranitidine Hcl] Diarrhea and Nausea Only    Night sweats    Medications Reviewed Today     Reviewed by [name], FNP (Family Nurse Practitioner) on 09/05/21 at [place] List Status: <None>   Medication Order Taking? Sig Documenting Provider Last Dose Status Informant  carvedilol (COREG) 6.25 MG tablet [id number] Yes Take 1 tablet (6.25 mg total) by mouth 2 (two) times daily with a meal. Needs appt for further refills Larey Dresser, MD Taking Active   Dulaglutide (TRULICITY) 1.70 YF/7.4BS SOPN [id number]  Inject 0.75 mg into the skin once a week. [name], FNP  Active   ezetimibe (ZETIA) 10 MG tablet [id number] Yes Take 1 tablet (10 mg total) by mouth daily. Minna Merritts, MD  Taking Active Self  fenofibrate (TRICOR) 145 MG tablet [id number]  Take 1 tablet (145 mg total) by mouth daily. [name], FNP  Active   furosemide (LASIX) 40 MG tablet [id number]  Take 1.5 tablets (60 mg total) by mouth daily. Tally Joe T, FNP  Active   Insulin Glargine [facility] KWIKPEN) 100 UNIT/ML [id number]  Inject 28 Units into the skin at bedtime. [name], FNP  Active   insulin lispro (HUMALOG) 100 UNIT/ML KwikPen [id number] Yes Inject 3 Units into the skin 3 (three) times daily. [name], MD Taking Active   Insulin Pen Needle (PEN NEEDLES) 32G X 4 MM MISC [id number]  Yes 200 each by Does not apply route at bedtime. [name], FNP  Active   Ipratropium-Albuterol (COMBIVENT RESPIMAT) 20-100 MCG/ACT AERS respimat [id number] Yes Inhale 1 puff into the lungs every 6 (six) hours as needed for wheezing. [provider] Taking Active Self  metolazone (ZAROXOLYN) 5 MG tablet [id number]  TAKE 1 TABLET (5 MG TOTAL) BY MOUTH DAILY AS NEEDED (FOR WEIGHT OVER 245). [name], FNP  Active   nitroGLYCERIN (NITROSTAT) 0.4 MG SL tablet [id number] Yes Place 1 tablet (0.4 mg total) under the tongue every 5 (five) minutes x 3 doses as needed for chest pain. Lorella Nimrod, MD Taking Active Self  potassium chloride SA (KLOR-CON M20) 20 MEQ tablet [id number] Yes Take 1 tablet daily Alisa Graff, FNP Taking Active Self  rivaroxaban (XARELTO) 20 MG TABS tablet [id number] Yes Take 1 tablet (20 mg total) by mouth daily with supper. [name], MD Taking Active   rosuvastatin (CRESTOR) 20 MG tablet [id number]  Take 1 tablet (20 mg total) by mouth daily. [name], FNP  Active   sacubitril-valsartan (ENTRESTO) 24-26 [place] [id number]  Take 1 tablet by mouth 2 (two) times daily. [name], FNP  Active             Patient Active Problem List   Diagnosis Date Noted   Right foot pain 09/05/2021   Arch pain of right foot 09/05/2021   Elevated triglycerides with high cholesterol 09/05/2021    Memory loss or impairment 09/05/2021   Hyperlipidemia associated with type 2 diabetes mellitus ([place]) 09/05/2021   Hospital discharge follow-up 09/05/2021   Dyspnea 08/10/2021   Acute on chronic systolic (congestive) heart failure ([place]) 08/09/2021   Type II or unspecified type diabetes mellitus without mention of complication, not stated as uncontrolled 05/21/2021   Acute on chronic diastolic CHF (congestive heart failure) ([place]) 05/21/2021   Long term current use of anticoagulant 05/20/2021   Mitral regurgitation 05/20/2021   Mediastinal lymphadenopathy 05/20/2021   Atherosclerotic heart disease of native coronary artery with other forms of angina pectoris ([place])    Acalculous cholecystitis 03/29/2021   Right upper quadrant abdominal pain 03/28/2021   AKI (acute kidney injury) ([place]) 11/24/2020   Acute kidney injury ([place]) 11/23/2020   OSA on CPAP 11/23/2020   Chest pain 10/27/2020   Snoring 08/15/2020   Type II diabetes mellitus with renal manifestations ([place]) 06/11/2020   COPD (chronic obstructive pulmonary disease) ([place]) 06/11/2020   Leukocytosis 06/11/2020   Hyperglycemia    Elevated brain natriuretic peptide (BNP) level    Uncontrolled type 2 diabetes mellitus with hyperglycemia, with long-term current use of insulin ([place]) 05/13/2020   Acute on chronic HFrEF (heart failure with reduced ejection fraction) ([place]) 05/12/2020   Erectile dysfunction due to arterial insufficiency 03/15/2020   Screening PSA (prostate specific antigen) 03/15/2020   Erectile dysfunction 03/02/2020   History of pulmonary embolism    Hemoptysis    Costochondritis    Acute on chronic combined systolic (congestive) and diastolic (congestive) heart failure ([place]) 12/20/2019   Acute on chronic heart failure ([place]) 11/22/2019   CKD (chronic kidney disease), stage IIIa 06/26/2019   Type 2 diabetes mellitus with hyperlipidemia ([place]) 06/26/2019   Hypertensive urgency 06/26/2019   Acute on chronic combined systolic  and diastolic CHF (congestive heart failure) ([place]) 09/02/2017   Other chest pain 08/24/2017   Dizziness    Noncompliance with medications 04/29/2017   Back pain 03/06/2017   Tobacco use 12/04/2016   Gallbladder sludge 12/04/2016  Acute on chronic systolic CHF (congestive heart failure) ([place]) 11/28/2016   Coronary artery disease, non-occlusive 03/15/2016   Atypical chest pain 02/16/2016   Orthostatic hypotension 01/23/2016   Hypomagnesemia 74/04/9277   Chronic systolic CHF (congestive heart failure) ([place]) 01/23/2016   NICM (nonischemic cardiomyopathy) ([place]) 01/17/2016   NSVT (nonsustained ventricular tachycardia)    Troponin I above reference range    Mixed hyperlipidemia 05/17/2015   COPD exacerbation ([place]) 05/17/2015   Essential hypertension 03/31/2015   Elevated troponin 03/20/2015    Immunization History  Administered Date(s) Administered   Hep A / Hep B 03/26/2012   Influenza,inj,Quad PF,6+ Mos 05/19/2015, 08/22/2017, 06/27/2019, 06/23/2020, 05/23/2021   Pneumococcal Polysaccharide-23 01/06/2016    Conditions to be addressed/monitored:  {USCCMDZASSESSMENTOPTIONS:23563}  There are no care plans that you recently modified to display for this patient.    Medication Assistance: {MEDASSISTANCEINFO:25044}  Compliance/Adherence/Medication fill history: Care Gaps: ***  Star-Rating Drugs: ***  Patient's preferred pharmacy is:  [id number] [place], [place] [address] [id number]: [phone]: [phone] Uses pill box? {Yes or If no, why not?:20788} Pt endorses ***% compliance  We discussed: {Pharmacy options:24294} Patient decided to: {US Pharmacy Plan:23885}  Care Plan and Follow Up Patient Decision:  {FOLLOWUP:24991}  Plan: {CM FOLLOW UP PLAN:25073}  ***

## 2021-09-18 ENCOUNTER — Encounter: Payer: Self-pay | Admitting: Podiatry

## 2021-09-18 ENCOUNTER — Other Ambulatory Visit: Payer: Self-pay | Admitting: Podiatry

## 2021-09-18 ENCOUNTER — Ambulatory Visit (INDEPENDENT_AMBULATORY_CARE_PROVIDER_SITE_OTHER): Payer: BC Managed Care – PPO | Admitting: Podiatry

## 2021-09-18 ENCOUNTER — Other Ambulatory Visit: Payer: Self-pay

## 2021-09-18 ENCOUNTER — Ambulatory Visit (INDEPENDENT_AMBULATORY_CARE_PROVIDER_SITE_OTHER): Payer: BC Managed Care – PPO

## 2021-09-18 DIAGNOSIS — M722 Plantar fascial fibromatosis: Secondary | ICD-10-CM

## 2021-09-18 DIAGNOSIS — M779 Enthesopathy, unspecified: Secondary | ICD-10-CM

## 2021-09-18 MED ORDER — TRAMADOL HCL 50 MG PO TABS: 50.0000 mg | ORAL_TABLET | ORAL | 0 refills | Status: AC | PRN

## 2021-09-18 NOTE — Progress Notes (Signed)
Subjective: 51 y.o. male presenting today as a new patient for evaluation of bilateral heel and arch pain has been going on for about 2 weeks now.  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  This was to the right foot.  He says the left foot has been hurting as well.  Currently he has not done anything for treatment he presents for further treatment and evaluation  Past Medical History:  Diagnosis Date   CAD (coronary artery disease)    a. 04/2015 low risk MV;  b. 12/2016 Cath: minor irregs in LAD/Diag/LCX/OM, RCA 40p/m/d; c. 05/2020 Cath: LM nl, LAD 50d, LCX 30p, OM1 40, RCA 100p w/ L->L collats. CO/CI 3.1/1.3-->Med rx.   Chest wall pain, chronic    CHF (congestive heart failure) (HCC)    Chronic Troponin Elevation    CKD (chronic kidney disease), stage II-III    COPD (chronic obstructive pulmonary disease) (HCC)    Diabetes mellitus without complication (HCC)    HFrEF (heart failure with reduced ejection fraction) ([place])    Hypertension    Mitral regurgitation    Mild to moderate by October 2021 echocardiogram.   Mixed Ischemic & NICM (nonischemic cardiomyopathy) ([place])    a. 03/2015 Echo: EF 45-50%; b. 12/2015 Echo: EF 20-25%; c. 02/2016 Echo: EF 30-35%; d. 11/2016 Echo: EF 40-45%; e. 06/2019 Echo: EF 30-35%; f. 11/2019 Echo: EF 25-30%; g. 05/2020 Echo: EF 35-40%, glob HK, nl RV size/fxn, PASP 44.28mmHg, mod dil LA. Mild to mod MR.   Myocardial infarct (HCC)    NSVT (nonsustained ventricular tachycardia)    a. 12/2015 noted on tele-->amio;  b. 12/2015 Event monitor: no VT noted.   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  12/31/19: RUL and RLL PEs.   Syncope    a. 01/2016 - felt to be vasovagal.   Past Surgical History:  Procedure Laterality Date   AMPUTATION     CARDIAC CATHETERIZATION     FINGER AMPUTATION      Traumatic   FINGER FRACTURE SURGERY Left    LEFT HEART CATH AND CORONARY ANGIOGRAPHY N/A 01/06/2017   Procedure: Left Heart Cath and Coronary Angiography;  Surgeon: Wellington Hampshire, MD;  Location: [facility];  Service: Cardiovascular;  Laterality: N/A;   RIGHT/LEFT HEART CATH AND CORONARY ANGIOGRAPHY N/A 06/13/2020   Procedure: RIGHT/LEFT HEART CATH AND CORONARY ANGIOGRAPHY;  Surgeon: Nelva Bush, MD;  Location: [facility];  Service: Cardiovascular;  Laterality: N/A;   Allergies  Allergen Reactions   Metformin And Related Nausea And Vomiting   Prednisone Other (See Comments)    Reaction: Hallucinations     Zantac [Ranitidine Hcl] Diarrhea and Nausea Only    Night sweats   Objective: Physical Exam General: The patient is alert and oriented x3 in no acute distress.  Dermatology: Skin is warm, dry and supple bilateral lower extremities. Negative for open lesions or macerations bilateral.   Vascular: Dorsalis Pedis and Posterior Tibial pulses palpable bilateral.  Capillary fill time is immediate to all digits.  Neurological: Epicritic and protective threshold intact bilateral.   Musculoskeletal: Tenderness to palpation to the plantar aspect of the bilateral heels and along the plantar fascia. All other joints range of motion within normal limits bilateral. Strength 5/5 in all groups bilateral.   Radiographic exam: Normal osseous mineralization.  Joint spaces preserved. No fracture/dislocation/boney destruction. No other soft tissue abnormalities or radiopaque foreign bodies.  Posterior heel spurs noted to the bilateral feet  Assessment: 1. plantar fasciitis bilateral feet; including along the medial longitudinal arch  Plan of Care:  1. Patient evaluated. Xrays reviewed.   2. Injection of 0.5cc Celestone soluspan injected into the bilateral heels.  3.  Patient allergic to oral steroid.  He says it gives him hallucinations 4.  No NSAIDs prescribed.  Patient on  anticoagulant medication and CKD. 5. Plantar fascial band(s) dispensed for bilateral plantar fasciitis. 6. Instructed patient regarding therapies and modalities at home to alleviate symptoms.  7. Return to clinic in 4 weeks.    *Works for [place] M. Kailei Cowens, DPM [facility]  Dr. Edrick Kins, DPM    [address], [place] [address]                Office [phone]  Fax [phone]

## 2021-09-27 ENCOUNTER — Telehealth: Payer: Self-pay

## 2021-09-27 ENCOUNTER — Encounter: Payer: Self-pay | Admitting: Emergency Medicine

## 2021-09-27 ENCOUNTER — Other Ambulatory Visit: Payer: Self-pay

## 2021-09-27 ENCOUNTER — Inpatient Hospital Stay
Admission: EM | Admit: 2021-09-27 | Discharge: 2021-09-29 | DRG: 291 | Disposition: A | Discharge status: Home / Self Care | Payer: BC Managed Care – PPO | Attending: Internal Medicine | Admitting: Internal Medicine

## 2021-09-27 ENCOUNTER — Emergency Department: Payer: BC Managed Care – PPO

## 2021-09-27 DIAGNOSIS — E1165 Type 2 diabetes mellitus with hyperglycemia: Secondary | ICD-10-CM | POA: Diagnosis present

## 2021-09-27 DIAGNOSIS — Z86711 Personal history of pulmonary embolism: Secondary | ICD-10-CM | POA: Diagnosis present

## 2021-09-27 DIAGNOSIS — I509 Heart failure, unspecified: Secondary | ICD-10-CM | POA: Diagnosis not present

## 2021-09-27 DIAGNOSIS — Z7901 Long term (current) use of anticoagulants: Secondary | ICD-10-CM

## 2021-09-27 DIAGNOSIS — Z833 Family history of diabetes mellitus: Secondary | ICD-10-CM | POA: Diagnosis not present

## 2021-09-27 DIAGNOSIS — E782 Mixed hyperlipidemia: Secondary | ICD-10-CM | POA: Diagnosis not present

## 2021-09-27 DIAGNOSIS — R0602 Shortness of breath: Secondary | ICD-10-CM

## 2021-09-27 DIAGNOSIS — Z79899 Other long term (current) drug therapy: Secondary | ICD-10-CM | POA: Diagnosis not present

## 2021-09-27 DIAGNOSIS — I2582 Chronic total occlusion of coronary artery: Secondary | ICD-10-CM | POA: Diagnosis present

## 2021-09-27 DIAGNOSIS — I272 Pulmonary hypertension, unspecified: Secondary | ICD-10-CM | POA: Diagnosis present

## 2021-09-27 DIAGNOSIS — Z794 Long term (current) use of insulin: Secondary | ICD-10-CM

## 2021-09-27 DIAGNOSIS — R778 Other specified abnormalities of plasma proteins: Secondary | ICD-10-CM | POA: Diagnosis not present

## 2021-09-27 DIAGNOSIS — I2510 Atherosclerotic heart disease of native coronary artery without angina pectoris: Secondary | ICD-10-CM | POA: Diagnosis present

## 2021-09-27 DIAGNOSIS — Z20822 Contact with and (suspected) exposure to covid-19: Secondary | ICD-10-CM | POA: Diagnosis not present

## 2021-09-27 DIAGNOSIS — I25118 Atherosclerotic heart disease of native coronary artery with other forms of angina pectoris: Secondary | ICD-10-CM | POA: Diagnosis not present

## 2021-09-27 DIAGNOSIS — I5023 Acute on chronic systolic (congestive) heart failure: Secondary | ICD-10-CM | POA: Diagnosis present

## 2021-09-27 DIAGNOSIS — J449 Chronic obstructive pulmonary disease, unspecified: Secondary | ICD-10-CM | POA: Diagnosis present

## 2021-09-27 DIAGNOSIS — I420 Dilated cardiomyopathy: Secondary | ICD-10-CM | POA: Diagnosis not present

## 2021-09-27 DIAGNOSIS — Z87891 Personal history of nicotine dependence: Secondary | ICD-10-CM

## 2021-09-27 DIAGNOSIS — E1122 Type 2 diabetes mellitus with diabetic chronic kidney disease: Secondary | ICD-10-CM | POA: Diagnosis present

## 2021-09-27 DIAGNOSIS — R079 Chest pain, unspecified: Secondary | ICD-10-CM | POA: Diagnosis not present

## 2021-09-27 DIAGNOSIS — I5043 Acute on chronic combined systolic (congestive) and diastolic (congestive) heart failure: Secondary | ICD-10-CM | POA: Diagnosis present

## 2021-09-27 DIAGNOSIS — I252 Old myocardial infarction: Secondary | ICD-10-CM | POA: Diagnosis not present

## 2021-09-27 DIAGNOSIS — I472 Ventricular tachycardia, unspecified: Secondary | ICD-10-CM | POA: Diagnosis not present

## 2021-09-27 DIAGNOSIS — E1129 Type 2 diabetes mellitus with other diabetic kidney complication: Secondary | ICD-10-CM | POA: Diagnosis present

## 2021-09-27 DIAGNOSIS — I10 Essential (primary) hypertension: Secondary | ICD-10-CM | POA: Diagnosis present

## 2021-09-27 DIAGNOSIS — E785 Hyperlipidemia, unspecified: Secondary | ICD-10-CM | POA: Diagnosis not present

## 2021-09-27 DIAGNOSIS — I130 Hypertensive heart and chronic kidney disease with heart failure and stage 1 through stage 4 chronic kidney disease, or unspecified chronic kidney disease: Principal | ICD-10-CM | POA: Diagnosis present

## 2021-09-27 DIAGNOSIS — R040 Epistaxis: Secondary | ICD-10-CM | POA: Diagnosis not present

## 2021-09-27 DIAGNOSIS — N1831 Chronic kidney disease, stage 3a: Secondary | ICD-10-CM | POA: Diagnosis present

## 2021-09-27 DIAGNOSIS — R0789 Other chest pain: Secondary | ICD-10-CM | POA: Diagnosis not present

## 2021-09-27 DIAGNOSIS — R7989 Other specified abnormal findings of blood chemistry: Secondary | ICD-10-CM | POA: Diagnosis present

## 2021-09-27 DIAGNOSIS — N183 Chronic kidney disease, stage 3 unspecified: Secondary | ICD-10-CM | POA: Diagnosis present

## 2021-09-27 DIAGNOSIS — R739 Hyperglycemia, unspecified: Secondary | ICD-10-CM

## 2021-09-27 DIAGNOSIS — J9811 Atelectasis: Secondary | ICD-10-CM | POA: Diagnosis not present

## 2021-09-27 DIAGNOSIS — I110 Hypertensive heart disease with heart failure: Secondary | ICD-10-CM | POA: Diagnosis not present

## 2021-09-27 DIAGNOSIS — Z8249 Family history of ischemic heart disease and other diseases of the circulatory system: Secondary | ICD-10-CM

## 2021-09-27 LAB — TROPONIN I (HIGH SENSITIVITY)
Troponin I (High Sensitivity): 43 ng/L — ABNORMAL HIGH (ref <18)
Troponin I (High Sensitivity): 45 ng/L — ABNORMAL HIGH (ref <18)
Troponin I (High Sensitivity): 43 ng/L — ABNORMAL HIGH (ref <18)

## 2021-09-27 LAB — GLUCOSE, CAPILLARY
Glucose-Capillary: 226 mg/dL — ABNORMAL HIGH (ref 70–99)
Glucose-Capillary: 269 mg/dL — ABNORMAL HIGH (ref 70–99)
Glucose-Capillary: 291 mg/dL — ABNORMAL HIGH (ref 70–99)

## 2021-09-27 LAB — CBG MONITORING, ED: Glucose-Capillary: 245 mg/dL — ABNORMAL HIGH (ref 70–99)

## 2021-09-27 LAB — BASIC METABOLIC PANEL
Anion gap: 8 (ref 5–15)
BUN: 24 mg/dL — ABNORMAL HIGH (ref 6–20)
CO2: 25 mmol/L (ref 22–32)
Calcium: 9 mg/dL (ref 8.9–10.3)
Chloride: 106 mmol/L (ref 98–111)
Creatinine, Ser: 1.04 mg/dL (ref 0.61–1.24)
GFR, Estimated: >60 mL/min (ref >60)
Glucose, Bld: 314 mg/dL — ABNORMAL HIGH (ref 70–99)
Potassium: 4.1 mmol/L (ref 3.5–5.1)
Sodium: 139 mmol/L (ref 135–145)

## 2021-09-27 LAB — CBC
HCT: 44.2 % (ref 39.0–52.0)
Hemoglobin: 14.9 g/dL (ref 13.0–17.0)
MCH: 28.3 pg (ref 26.0–34.0)
MCHC: 33.7 g/dL (ref 30.0–36.0)
MCV: 84 fL (ref 80.0–100.0)
Platelets: 147 10*3/uL — ABNORMAL LOW (ref 150–400)
RBC: 5.26 MIL/uL (ref 4.22–5.81)
RDW: 14 % (ref 11.5–15.5)
WBC: 8.5 10*3/uL (ref 4.0–10.5)
nRBC: 0 % (ref 0.0–0.2)

## 2021-09-27 LAB — MAGNESIUM: Magnesium: 1.8 mg/dL (ref 1.7–2.4)

## 2021-09-27 LAB — RESP PANEL BY RT-PCR (FLU A&B, COVID) ARPGX2
Influenza A by PCR: NEGATIVE
Influenza B by PCR: NEGATIVE
SARS Coronavirus 2 by RT PCR: NEGATIVE

## 2021-09-27 LAB — PROTIME-INR
INR: 1 (ref 0.8–1.2)
Prothrombin Time: 13.3 seconds (ref 11.4–15.2)

## 2021-09-27 LAB — HEMOGLOBIN A1C
Hgb A1c MFr Bld: 8.2 % — ABNORMAL HIGH (ref 4.8–5.6)
Mean Plasma Glucose: 188.64 mg/dL

## 2021-09-27 LAB — APTT: aPTT: 27 seconds (ref 24–36)

## 2021-09-27 LAB — BRAIN NATRIURETIC PEPTIDE: B Natriuretic Peptide: 1008.2 pg/mL — ABNORMAL HIGH (ref 0.0–100.0)

## 2021-09-27 MED ORDER — ONDANSETRON HCL 4 MG/2ML IJ SOLN: 4.0000 mg | Freq: Three times a day (TID) | INTRAMUSCULAR | Status: DC | PRN

## 2021-09-27 MED ORDER — HYDRALAZINE HCL 20 MG/ML IJ SOLN: 5.0000 mg | INTRAMUSCULAR | Status: DC | PRN

## 2021-09-27 MED ORDER — INSULIN GLARGINE-YFGN 100 UNIT/ML ~~LOC~~ SOLN
22.0000 [IU] | Freq: Every day | SUBCUTANEOUS | Status: DC
  Administered 2021-09-27 – 2021-09-28 (×2): 22 [IU] via SUBCUTANEOUS
  Filled 2021-09-27 (×3): qty 0.22

## 2021-09-27 MED ORDER — NITROGLYCERIN 0.4 MG SL SUBL
0.4000 mg | SUBLINGUAL_TABLET | SUBLINGUAL | Status: DC | PRN
  Administered 2021-09-27: 0.4 mg via SUBLINGUAL
  Filled 2021-09-27: qty 1

## 2021-09-27 MED ORDER — RIVAROXABAN 20 MG PO TABS
20.0000 mg | ORAL_TABLET | Freq: Every day | ORAL | Status: DC
  Administered 2021-09-27 – 2021-09-28 (×2): 20 mg via ORAL
  Filled 2021-09-27 (×2): qty 1

## 2021-09-27 MED ORDER — ALBUTEROL SULFATE (2.5 MG/3ML) 0.083% IN NEBU: 2.5000 mg | INHALATION_SOLUTION | RESPIRATORY_TRACT | Status: DC | PRN

## 2021-09-27 MED ORDER — DM-GUAIFENESIN ER 30-600 MG PO TB12: 1.0000 | ORAL_TABLET | Freq: Two times a day (BID) | ORAL | Status: DC | PRN

## 2021-09-27 MED ORDER — INSULIN ASPART 100 UNIT/ML IJ SOLN
0.0000 [IU] | INTRAMUSCULAR | Status: DC
  Administered 2021-09-27: 8 [IU] via SUBCUTANEOUS
  Administered 2021-09-27: 5 [IU] via SUBCUTANEOUS
  Administered 2021-09-27: 8 [IU] via SUBCUTANEOUS
  Administered 2021-09-28: 5 [IU] via SUBCUTANEOUS
  Administered 2021-09-28: 3 [IU] via SUBCUTANEOUS
  Administered 2021-09-28 (×2): 2 [IU] via SUBCUTANEOUS
  Administered 2021-09-28: 3 [IU] via SUBCUTANEOUS
  Filled 2021-09-27 (×8): qty 1

## 2021-09-27 MED ORDER — SACUBITRIL-VALSARTAN 24-26 MG PO TABS
1.0000 | ORAL_TABLET | Freq: Two times a day (BID) | ORAL | Status: DC
  Administered 2021-09-27 – 2021-09-29 (×4): 1 via ORAL
  Filled 2021-09-27 (×4): qty 1

## 2021-09-27 MED ORDER — HYDROMORPHONE HCL 1 MG/ML IJ SOLN
1.0000 mg | INTRAMUSCULAR | Status: DC | PRN
  Administered 2021-09-27 – 2021-09-28 (×4): 1 mg via INTRAVENOUS
  Filled 2021-09-27 (×4): qty 1

## 2021-09-27 MED ORDER — FENTANYL CITRATE PF 50 MCG/ML IJ SOSY
50.0000 ug | PREFILLED_SYRINGE | Freq: Once | INTRAMUSCULAR | Status: AC
  Administered 2021-09-27: 50 ug via INTRAVENOUS
  Filled 2021-09-27: qty 1

## 2021-09-27 MED ORDER — CARVEDILOL 6.25 MG PO TABS
6.2500 mg | ORAL_TABLET | Freq: Two times a day (BID) | ORAL | Status: DC
  Administered 2021-09-27 – 2021-09-29 (×4): 6.25 mg via ORAL
  Filled 2021-09-27 (×4): qty 1

## 2021-09-27 MED ORDER — FUROSEMIDE 10 MG/ML IJ SOLN
40.0000 mg | Freq: Two times a day (BID) | INTRAMUSCULAR | Status: DC
  Administered 2021-09-27 – 2021-09-29 (×4): 40 mg via INTRAVENOUS
  Filled 2021-09-27 (×4): qty 4

## 2021-09-27 MED ORDER — IPRATROPIUM-ALBUTEROL 0.5-2.5 (3) MG/3ML IN SOLN: 3.0000 mL | Freq: Four times a day (QID) | RESPIRATORY_TRACT | Status: DC

## 2021-09-27 MED ORDER — ACETAMINOPHEN 325 MG PO TABS: 650.0000 mg | ORAL_TABLET | Freq: Four times a day (QID) | ORAL | Status: DC | PRN

## 2021-09-27 MED ORDER — ROSUVASTATIN CALCIUM 10 MG PO TABS
20.0000 mg | ORAL_TABLET | Freq: Every evening | ORAL | Status: DC
  Administered 2021-09-28: 20 mg via ORAL
  Filled 2021-09-27: qty 2

## 2021-09-27 MED ORDER — FENOFIBRATE 160 MG PO TABS
160.0000 mg | ORAL_TABLET | Freq: Every day | ORAL | Status: DC
  Administered 2021-09-28 – 2021-09-29 (×2): 160 mg via ORAL
  Filled 2021-09-27 (×2): qty 1

## 2021-09-27 MED ORDER — FUROSEMIDE 10 MG/ML IJ SOLN
80.0000 mg | Freq: Once | INTRAMUSCULAR | Status: AC
  Administered 2021-09-27: 80 mg via INTRAVENOUS
  Filled 2021-09-27: qty 8

## 2021-09-27 NOTE — Progress Notes (Signed)
°  Chronic Care Management  APPOINTMENT REMINDER   Gabriel [name]. was reminded to have all medications, supplements and any blood glucose and blood pressure readings available for review with Junius Argyle, Pharm. D, at his telephone visit on 10/02/2021 at 9:30 am  Patient confirm appointment.  [place] Pharmacist Assistant [phone]

## 2021-09-27 NOTE — H&P (Signed)
History and Physical    Gabriel [name]. WJX:[id number] DOB: Jul 03, 1971 DOA: 09/27/2021  Referring MD/NP/PA:   PCP: [name], FNP   Patient coming from:  The patient is coming from home.  At baseline, pt is independent for most of ADL.        Chief Complaint: Shortness of breath  HPI: Gabriel [name]. is a 51 y.o. male with medical history significant of sCHF with EF of 40-45%, recurrent PE on Xarelto, hypertension, hyperlipidemia, diabetes mellitus, COPD, CAD, CKD-3A, psoriasis, mitral valve regurgitation, who presents with shortness of breath.  Patient states that his short of breath has been going on for more than 4 days, which has been progressively worsening, particularly on exertion.  Patient has dry cough, no fever or chills.  Patient has chest pain, which is located in substernal area, usually 10 out of 10 in severity, currently moderate, pressure-like, intermittent, nonradiating.  Denies nausea, vomiting, diarrhea or abdominal pain.  No symptoms of UTI.  Patient states that he had several episodes of nosebleeding, but currently no active bleeding.  Data Reviewed and ED Course: pt was found to have BMP 1008, troponin level 43, 45, 43, INR 1.0, PTT 27, pending COVID PCR, GFR > 60, temperature normal, blood pressure 170/99, heart rate 81, RR 26, oxygen saturation 91-97% on room air.  Chest x-ray showed a low volume without obvious infiltration.  Patient is admitted to progressive and is inpatient.   EKG: I have personally reviewed.  Sinus rhythm, QTc 469, right axis deviation, poor R wave progression, ST depression in the inferior leads and V3-V6   Review of Systems:   General: no fevers, chills, no body weight gain,  has fatigue HEENT: no blurry vision, hearing changes or sore throat Respiratory: has dyspnea, coughing, no wheezing CV: has chest pain, no palpitations GI: no nausea, vomiting, abdominal pain, diarrhea, constipation GU: no dysuria, burning on urination,  increased urinary frequency, hematuria  Ext: has trace leg edema Neuro: no unilateral weakness, numbness, or tingling, no vision change or hearing loss Skin: no rash, no skin tear. MSK: No muscle spasm, no deformity, no limitation of range of movement in spin Heme: No easy bruising.  Travel history: No recent long distant travel.   Allergy:  Allergies  Allergen Reactions   Metformin And Related Nausea And Vomiting   Prednisone Other (See Comments)    Reaction: Hallucinations     Zantac [Ranitidine Hcl] Diarrhea and Nausea Only    Night sweats    Past Medical History:  Diagnosis Date   CAD (coronary artery disease)    a. 04/2015 low risk MV;  b. 12/2016 Cath: minor irregs in LAD/Diag/LCX/OM, RCA 40p/m/d; c. 05/2020 Cath: LM nl, LAD 50d, LCX 30p, OM1 40, RCA 100p w/ L->L collats. CO/CI 3.1/1.3-->Med rx.   Chest wall pain, chronic    CHF (congestive heart failure) (HCC)    Chronic Troponin Elevation    CKD (chronic kidney disease), stage II-III    COPD (chronic obstructive pulmonary disease) (HCC)    Diabetes mellitus without complication (HCC)    HFrEF (heart failure with reduced ejection fraction) ([place])    Hypertension    Mitral regurgitation    Mild to moderate by October 2021 echocardiogram.   Mixed Ischemic & NICM (nonischemic cardiomyopathy) ([place])    a. 03/2015 Echo: EF 45-50%; b. 12/2015 Echo: EF 20-25%; c. 02/2016 Echo: EF 30-35%; d. 11/2016 Echo: EF 40-45%; e. 06/2019 Echo: EF 30-35%; f. 11/2019 Echo: EF 25-30%; g. 05/2020  Echo: EF 35-40%, glob HK, nl RV size/fxn, PASP 44.77mmHg, mod dil LA. Mild to mod MR.   Myocardial infarct (HCC)    NSVT (nonsustained ventricular tachycardia)    a. 12/2015 noted on tele-->amio;  b. 12/2015 Event monitor: no VT noted.   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  12/31/19: RUL and RLL PEs.   Syncope    a. 01/2016 - felt to be vasovagal.    Past Surgical History:   Procedure Laterality Date   AMPUTATION     CARDIAC CATHETERIZATION     FINGER AMPUTATION     Traumatic   FINGER FRACTURE SURGERY Left    LEFT HEART CATH AND CORONARY ANGIOGRAPHY N/A 01/06/2017   Procedure: Left Heart Cath and Coronary Angiography;  Surgeon: Wellington Hampshire, MD;  Location: [facility];  Service: Cardiovascular;  Laterality: N/A;   RIGHT/LEFT HEART CATH AND CORONARY ANGIOGRAPHY N/A 06/13/2020   Procedure: RIGHT/LEFT HEART CATH AND CORONARY ANGIOGRAPHY;  Surgeon: Nelva Bush, MD;  Location: [facility];  Service: Cardiovascular;  Laterality: N/A;    Social History:  reports that he quit smoking about 16 months ago. His smoking use included cigarettes. He has a 16.50 pack-year smoking history. He quit smokeless tobacco use about 16 months ago. He reports current alcohol use. He reports that he does not use drugs.  Family History:  Family History  Problem Relation Age of Onset   Diabetes Mother    Diabetes Mellitus II Mother    Hypothyroidism Mother    Hypertension Mother    Kidney failure Mother        Dialysis   Heart attack Mother        60 yo approximately   Hypertension Father    Gout Father    Cancer Maternal Grandfather    Diabetes Maternal Grandfather    Cancer Paternal Aunt      Prior to Admission medications   Medication Sig Start Date End Date Taking? Authorizing Provider  carvedilol (COREG) 6.25 MG tablet Take 1 tablet (6.25 mg total) by mouth 2 (two) times daily with a meal. Needs appt for further refills 08/31/21   Larey Dresser, MD  Dulaglutide (TRULICITY) 1.06 YI/9.4WN SOPN Inject 0.75 mg into the skin once a week. 09/05/21 08/07/22  [name], FNP  ezetimibe (ZETIA) 10 MG tablet Take 1 tablet (10 mg total) by mouth daily. 04/04/21   Minna Merritts, MD  fenofibrate (TRICOR) 145 MG tablet Take 1 tablet (145 mg total) by mouth daily. 09/05/21   [name], FNP  furosemide (LASIX) 40 MG tablet Take 1.5 tablets (60 mg  total) by mouth daily. 09/05/21 08/31/22  [name], FNP  Insulin Glargine [facility] KWIKPEN) 100 UNIT/ML Inject 28 Units into the skin at bedtime. 09/05/21 09/26/22  [name], FNP  insulin lispro (HUMALOG) 100 UNIT/ML KwikPen Inject 3 Units into the skin 3 (three) times daily. 08/13/21 11/11/21  [name], MD  Insulin Pen Needle (PEN NEEDLES) 32G X 4 MM MISC 200 each by Does not apply route at bedtime. 09/05/21   [name], FNP  Ipratropium-Albuterol (COMBIVENT RESPIMAT) 20-100 MCG/ACT AERS respimat Inhale 1 puff into the lungs every 6 (six) hours as needed for wheezing.    [provider]  metolazone (ZAROXOLYN) 5 MG tablet TAKE 1 TABLET (5 MG TOTAL) BY MOUTH DAILY AS NEEDED (FOR WEIGHT OVER 245). 09/05/21  [name], FNP  nitroGLYCERIN (NITROSTAT) 0.4 MG SL tablet Place 1 tablet (0.4 mg total) under the tongue every 5 (five) minutes x 3 doses as needed for chest pain. 06/09/20   Lorella Nimrod, MD  potassium chloride SA (KLOR-CON M20) 20 MEQ tablet Take 1 tablet daily 05/29/21   Alisa Graff, FNP  rivaroxaban (XARELTO) 20 MG TABS tablet Take 1 tablet (20 mg total) by mouth daily with supper. 08/13/21 11/11/21  [name], MD  rosuvastatin (CRESTOR) 20 MG tablet Take 1 tablet (20 mg total) by mouth daily. 09/05/21   [name], FNP  sacubitril-valsartan (ENTRESTO) 24-26 MG Take 1 tablet by mouth 2 (two) times daily. 09/05/21   [name], FNP    Physical Exam: Vitals:   09/27/21 1300 09/27/21 1321 09/27/21 1341 09/27/21 1631  BP: (!) 154/98  (!) 160/91 (!) 155/86  Pulse: 79 75 80 73  Resp: (!) 21 17 19 18   Temp:   97.8 F (36.6 C) 97.7 F (36.5 C)  TempSrc:      SpO2: 93% 95% 96% 95%  Weight:   111.7 kg   Height:       General: Not in acute distress HEENT:       Eyes: PERRL, EOMI, no scleral icterus.       ENT: No discharge from the ears and nose, no pharynx injection, no tonsillar enlargement.        Neck: positive JVD, no bruit, no mass felt. Heme: No  neck lymph node enlargement. Cardiac: S1/S2, RRR, No gallops or rubs. Respiratory: Has fine crackles bilaterally GI: Soft, nondistended, nontender, no rebound pain, no organomegaly, BS present. GU: No hematuria Ext: Has trace leg edema bilaterally. 1+DP/PT pulse bilaterally. Musculoskeletal: No joint deformities, No joint redness or warmth, no limitation of ROM in spin. Skin: No rashes.  Neuro: Alert, oriented X3, cranial nerves II-XII grossly intact, moves all extremities normally.  Psych: Patient is not psychotic, no suicidal or hemocidal ideation.  Labs on Admission: I have personally reviewed following labs and imaging studies  CBC: Recent Labs  Lab 09/27/21 0956  WBC 8.5  HGB 14.9  HCT 44.2  MCV 84.0  PLT 629*   Basic Metabolic Panel: Recent Labs  Lab 09/27/21 0956 09/27/21 1130  NA 139  --   K 4.1  --   CL 106  --   CO2 25  --   GLUCOSE 314*  --   BUN 24*  --   CREATININE 1.04  --   CALCIUM 9.0  --   MG  --  1.8   GFR: Estimated Creatinine Clearance: 107.2 mL/min (by C-G formula based on SCr of 1.04 mg/dL). Liver Function Tests: No results for input(s): AST, ALT, ALKPHOS, BILITOT, PROT, ALBUMIN in the last 168 hours. No results for input(s): LIPASE, AMYLASE in the last 168 hours. No results for input(s): AMMONIA in the last 168 hours. Coagulation Profile: Recent Labs  Lab 09/27/21 1130  INR 1.0   Cardiac Enzymes: No results for input(s): CKTOTAL, CKMB, CKMBINDEX, TROPONINI in the last 168 hours. BNP (last 3 results) No results for input(s): PROBNP in the last 8760 hours. HbA1C: No results for input(s): HGBA1C in the last 72 hours. CBG: Recent Labs  Lab 09/27/21 1301 09/27/21 1631  GLUCAP 245* 269*   Lipid Profile: No results for input(s): CHOL, HDL, LDLCALC, TRIG, CHOLHDL, LDLDIRECT in the last 72 hours. Thyroid Function Tests: No results for input(s): TSH, T4TOTAL, FREET4, T3FREE, THYROIDAB in the last 72  hours. Anemia Panel: No results for  input(s): VITAMINB12, FOLATE, FERRITIN, TIBC, IRON, RETICCTPCT in the last 72 hours. Urine analysis:    Component Value Date/Time   COLORURINE STRAW (A) 02/19/2021 0838   APPEARANCEUR CLEAR (A) 02/19/2021 0838   APPEARANCEUR Clear 03/08/2020 0951   LABSPEC 1.009 02/19/2021 0838   LABSPEC 1.019 11/12/2012 1713   PHURINE 5.0 02/19/2021 0838   GLUCOSEU >=500 (A) 02/19/2021 0838   GLUCOSEU Negative 11/12/2012 1713   HGBUR SMALL (A) 02/19/2021 0838   BILIRUBINUR NEGATIVE 02/19/2021 0838   BILIRUBINUR Negative 03/08/2020 0951   BILIRUBINUR Negative 11/12/2012 1713   KETONESUR NEGATIVE 02/19/2021 0838   PROTEINUR 30 (A) 02/19/2021 0838   NITRITE NEGATIVE 02/19/2021 0838   LEUKOCYTESUR NEGATIVE 02/19/2021 0838   LEUKOCYTESUR Negative 11/12/2012 1713   Sepsis Labs: @LABRCNTIP (procalcitonin:4,lacticidven:4) ) Recent Results (from the past 240 hour(s))  Resp Panel by RT-PCR (Flu A&B, Covid) Nasopharyngeal Swab     Status: None   Collection Time: 09/27/21 11:30 AM   Specimen: Nasopharyngeal Swab; Nasopharyngeal(NP) swabs in vial transport medium  Result Value Ref Range Status   SARS Coronavirus 2 by RT PCR NEGATIVE NEGATIVE Final    Comment: (NOTE) SARS-CoV-2 target nucleic acids are NOT DETECTED.  The SARS-CoV-2 RNA is generally detectable in upper respiratory specimens during the acute phase of infection. The lowest concentration of SARS-CoV-2 viral copies this assay can detect is 138 copies/mL. A negative result does not preclude SARS-Cov-2 infection and should not be used as the sole basis for treatment or other patient management decisions. A negative result may occur with  improper specimen collection/handling, submission of specimen other than nasopharyngeal swab, presence of viral mutation(s) within the areas targeted by this assay, and inadequate number of viral copies(<138 copies/mL). A negative result must be combined with clinical observations, patient history, and  epidemiological information. The expected result is Negative.  Fact Sheet for Patients:  EntrepreneurPulse.com.au  Fact Sheet for Healthcare Providers:  IncredibleEmployment.be  This test is no t yet approved or cleared by the [place] FDA and  has been authorized for detection and/or diagnosis of SARS-CoV-2 by FDA under an Emergency Use Authorization (EUA). This EUA will remain  in effect (meaning this test can be used) for the duration of the COVID-19 declaration under Section 564(b)(1) of the Act, 21 U.S.C.section 360bbb-3(b)(1), unless the authorization is terminated  or revoked sooner.       Influenza A by PCR NEGATIVE NEGATIVE Final   Influenza B by PCR NEGATIVE NEGATIVE Final    Comment: (NOTE) The Xpert Xpress SARS-CoV-2/FLU/RSV plus assay is intended as an aid in the diagnosis of influenza from Nasopharyngeal swab specimens and should not be used as a sole basis for treatment. Nasal washings and aspirates are unacceptable for Xpert Xpress SARS-CoV-2/FLU/RSV testing.  Fact Sheet for Patients: EntrepreneurPulse.com.au  Fact Sheet for Healthcare Providers: IncredibleEmployment.be  This test is not yet approved or cleared by the [place] FDA and has been authorized for detection and/or diagnosis of SARS-CoV-2 by FDA under an Emergency Use Authorization (EUA). This EUA will remain in effect (meaning this test can be used) for the duration of the COVID-19 declaration under Section 564(b)(1) of the Act, 21 U.S.C. section 360bbb-3(b)(1), unless the authorization is terminated or revoked.  Performed at [facility], [address] [place], [place] [address]      Radiological Exams on Admission: DG Chest 2 View  Result Date: 09/27/2021 CLINICAL DATA:  chest pain, SHOB EXAM: CHEST - 2 VIEW COMPARISON:  08/09/2021. FINDINGS: Linear opacity left midlung, compatible with subsegmental  atelectasis. No confluent consolidation. No visible pleural effusions or pneumothorax. Similar cardiomediastinal silhouette. IMPRESSION: Left midlung subsegmental atelectasis. Otherwise, low lung volumes without evidence of acute cardiopulmonary disease. Electronically Signed   By: Margaretha Sheffield M.D.   On: 09/27/2021 10:49      Assessment/Plan Principal Problem:   Acute on chronic systolic CHF (congestive heart failure) (HCC) Active Problems:   Elevated troponin   Essential hypertension   Mixed hyperlipidemia   CKD (chronic kidney disease), stage IIIa   History of pulmonary embolism   Type II diabetes mellitus with renal manifestations (HCC)   COPD (chronic obstructive pulmonary disease) (HCC)   Chest pain   Epistaxis   CAD (coronary artery disease)  Acute on chronic systolic CHF (congestive heart failure) ([place]): 2D echo on 05/21/2021 showed EF of 40-45%.  Patient has an elevated BNP 1008, leg edema, positive JVD, fine crackles on auscultation, clinically consistent with a CHF exacerbation.  -Will admit to progressive unit as inpatient -Lasix 40 mg bid by IV (patient received 1 dose of 80 mg of Lasix in the ED) -2d echo -Continue Entresto -Daily weights -strict I/O's -Low salt diet -Fluid restriction -Obtain REDs Vest reading  Elevated troponin, chest pain and hx of CAD: trop  43 --> 45 --> 43.  Likely due to demand ischemia.  -Check A1c, FLP -Trend troponin -Patient is on Crestor, fenofibrate -As needed nitroglycerin and Dilaudid  Essential hypertension -IV hydralazine as needed -Entresto  Mixed hyperlipidemia -Crestor and fenofibrate  CKD (chronic kidney disease), stage IIIa: Stable -Follow-up with BMP  History of pulmonary embolism: Patient has several episodes of nosebleeding, but no active bleeding currently.  Since patient is at high risk of developing recurrent PE, will continue Xarelto. -Continue Xarelto  Type II diabetes mellitus with renal  manifestations [facility]): Recent A1c 8.1, poorly controlled.  Patient is taking Humalog, Trulicity and glargine insulin 28 units daily -Sliding scale insulin -Glargine insulin 22 units daily  COPD (chronic obstructive pulmonary disease) (HCC) -Bronchodilators  Epistaxis: No active bleeding currently -Observe closely     DVT ppx: on Xarelto  Code Status: Full code  Family Communication: not done, no family member is at bed side.     Disposition Plan:  Anticipate discharge back to previous environment  Consults called:  [name] of card  Admission status and Level of care: Progressive:   as inpt        Severity of Illness:  The appropriate patient status for this patient is INPATIENT. Inpatient status is judged to be reasonable and necessary in order to provide the required intensity of service to ensure the patient's safety. The patient's presenting symptoms, physical exam findings, and initial radiographic and laboratory data in the context of their chronic comorbidities is felt to place them at high risk for further clinical deterioration. Furthermore, it is not anticipated that the patient will be medically stable for discharge from the hospital within 2 midnights of admission.   * I certify that at the point of admission it is my clinical judgment that the patient will require inpatient hospital care spanning beyond 2 midnights from the point of admission due to high intensity of service, high risk for further deterioration and high frequency of surveillance required.*       Date of Service 09/27/2021    [name] [facility]   If 7PM-7AM, please contact night-coverage [email or web address] 09/27/2021, 6:49 PM

## 2021-09-27 NOTE — Consult Note (Signed)
Cardiology Consultation:   Patient ID: Gabriel [name]. MRN: [id number]; DOB: 1971/02/03  Admit date: 09/27/2021 Date of Consult: 09/27/2021  PCP:  [name], FNP   [facility] Providers Cardiologist: Rockey Situ Physician requesting consult: Blaine Hamper Reason for consult: Acute on chronic diastolic and systolic CHF  Patient Profile:   Gabriel Daubert. is a 51 y.o. male with a hx of frequent hospital admissions for acute on chronic diastolic and systolic CHF, obesity, medication noncompliance, diet fluid noncompliance sleep apnea on CPAP, diabetes, COPD, hypertension, history of PE, coronary artery disease total occlusion RCA presenting with worsening shortness of breath, weight gain  History of Present Illness:   Gabriel [name] presents to the emergency room with chest pain, increased shortness of breath. Reports he has been taking Lasix 40 but increased recently by CHF clinic up to 60 daily Despite increasing Lasix weight has continued to trend upwards, worsening shortness of breath, chest tightness He reports he does have metolazone at home, uses it approximately once a week, has not titrated up on his Lasix or metolazone to adjust to worsening symptoms Reports Lasix 60 does not seem to be working  In the emergency room BNP 1000, troponin nontrending 40, blood pressure elevated 170/100 saturations in the 90s on room air, chest x-ray nonacute Admitted for diuresis   Past Medical History:  Diagnosis Date   CAD (coronary artery disease)    a. 04/2015 low risk MV;  b. 12/2016 Cath: minor irregs in LAD/Diag/LCX/OM, RCA 40p/m/d; c. 05/2020 Cath: LM nl, LAD 50d, LCX 30p, OM1 40, RCA 100p w/ L->L collats. CO/CI 3.1/1.3-->Med rx.   Chest wall pain, chronic    CHF (congestive heart failure) (HCC)    Chronic Troponin Elevation    CKD (chronic kidney disease), stage II-III    COPD (chronic obstructive pulmonary disease) (HCC)    Diabetes mellitus without complication (HCC)    HFrEF (heart  failure with reduced ejection fraction) ([place])    Hypertension    Mitral regurgitation    Mild to moderate by October 2021 echocardiogram.   Mixed Ischemic & NICM (nonischemic cardiomyopathy) ([place])    a. 03/2015 Echo: EF 45-50%; b. 12/2015 Echo: EF 20-25%; c. 02/2016 Echo: EF 30-35%; d. 11/2016 Echo: EF 40-45%; e. 06/2019 Echo: EF 30-35%; f. 11/2019 Echo: EF 25-30%; g. 05/2020 Echo: EF 35-40%, glob HK, nl RV size/fxn, PASP 44.30mmHg, mod dil LA. Mild to mod MR.   Myocardial infarct (HCC)    NSVT (nonsustained ventricular tachycardia)    a. 12/2015 noted on tele-->amio;  b. 12/2015 Event monitor: no VT noted.   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  12/31/19: RUL and RLL PEs.   Syncope    a. 01/2016 - felt to be vasovagal.    Past Surgical History:  Procedure Laterality Date   AMPUTATION     CARDIAC CATHETERIZATION     FINGER AMPUTATION     Traumatic   FINGER FRACTURE SURGERY Left    LEFT HEART CATH AND CORONARY ANGIOGRAPHY N/A 01/06/2017   Procedure: Left Heart Cath and Coronary Angiography;  Surgeon: Wellington Hampshire, MD;  Location: [facility];  Service: Cardiovascular;  Laterality: N/A;   RIGHT/LEFT HEART CATH AND CORONARY ANGIOGRAPHY N/A 06/13/2020   Procedure: RIGHT/LEFT HEART CATH AND CORONARY ANGIOGRAPHY;  Surgeon: Nelva Bush, MD;  Location: [facility];  Service: Cardiovascular;  Laterality: N/A;     Home  Medications:  Prior to Admission medications   Medication Sig Start Date End Date Taking? Authorizing Provider  carvedilol (COREG) 6.25 MG tablet Take 1 tablet (6.25 mg total) by mouth 2 (two) times daily with a meal. Needs appt for further refills 08/31/21  Yes Larey Dresser, MD  fenofibrate (TRICOR) 145 MG tablet Take 1 tablet (145 mg total) by mouth daily. 09/05/21  Yes [name], FNP  furosemide (LASIX) 40 MG tablet Take 1.5 tablets (60 mg total) by mouth daily. 09/05/21  08/31/22 Yes Tally Joe T, FNP  Insulin Glargine (BASAGLAR KWIKPEN) 100 UNIT/ML Inject 28 Units into the skin at bedtime. 09/05/21 09/26/22 Yes [name], FNP  insulin lispro (HUMALOG) 100 UNIT/ML KwikPen Inject 3 Units into the skin 3 (three) times daily. 08/13/21 11/11/21 Yes [name], MD  Ipratropium-Albuterol (COMBIVENT RESPIMAT) 20-100 MCG/ACT AERS respimat Inhale 1 puff into the lungs every 6 (six) hours as needed for wheezing.   Yes [provider]  metolazone (ZAROXOLYN) 5 MG tablet TAKE 1 TABLET (5 MG TOTAL) BY MOUTH DAILY AS NEEDED (FOR WEIGHT OVER 245). 09/05/21  Yes [name], FNP  potassium chloride SA (KLOR-CON M20) 20 MEQ tablet Take 1 tablet daily 05/29/21  Yes Hackney, Aura Fey, FNP  rivaroxaban (XARELTO) 20 MG TABS tablet Take 1 tablet (20 mg total) by mouth daily with supper. 08/13/21 11/11/21 Yes [name], MD  rosuvastatin (CRESTOR) 20 MG tablet Take 1 tablet (20 mg total) by mouth daily. 09/05/21  Yes [name], FNP  sacubitril-valsartan (ENTRESTO) 24-26 MG Take 1 tablet by mouth 2 (two) times daily. 09/05/21  Yes Tally Joe T, FNP  Dulaglutide (TRULICITY) 1.70 YF/7.4BS SOPN Inject 0.75 mg into the skin once a week. 09/05/21 08/07/22  [name], FNP  ezetimibe (ZETIA) 10 MG tablet Take 1 tablet (10 mg total) by mouth daily. Patient not taking: Reported on 09/27/2021 04/04/21   Minna Merritts, MD  Insulin Pen Needle (PEN NEEDLES) 32G X 4 MM MISC 200 each by Does not apply route at bedtime. 09/05/21   [name], FNP  nitroGLYCERIN (NITROSTAT) 0.4 MG SL tablet Place 1 tablet (0.4 mg total) under the tongue every 5 (five) minutes x 3 doses as needed for chest pain. 06/09/20   Lorella Nimrod, MD    Inpatient Medications: Scheduled Meds:  carvedilol  6.25 mg Oral BID WC   [START ON 09/28/2021] fenofibrate  160 mg Oral Daily   furosemide  40 mg Intravenous Q12H   insulin aspart  0-15 Units Subcutaneous Q4H   insulin glargine-yfgn  22 Units Subcutaneous QHS    rivaroxaban  20 mg Oral Q supper   [START ON 09/28/2021] rosuvastatin  20 mg Oral QPM   sacubitril-valsartan  1 tablet Oral BID   Continuous Infusions:  PRN Meds: acetaminophen, albuterol, dextromethorphan-guaiFENesin, hydrALAZINE, HYDROmorphone (DILAUDID) injection, nitroGLYCERIN, ondansetron (ZOFRAN) IV  Allergies:    Allergies  Allergen Reactions   Metformin And Related Nausea And Vomiting   Prednisone Other (See Comments)    Reaction: Hallucinations     Zantac [Ranitidine Hcl] Diarrhea and Nausea Only    Night sweats    Social History:   Social History   Socioeconomic History   Marital status: Widowed    Spouse name: Not on file   Number of children: 1   Years of education: 3   Highest education level: 12th grade  Occupational History   Occupation: disabled  Tobacco Use   Smoking status: Former  Packs/day: 0.50    Years: 33.00    Pack years: 16.50    Types: Cigarettes    Quit date: 05/08/2020    Years since quitting: 1.3   Smokeless tobacco: Former    Quit date: 05/08/2020   Tobacco comments:    Quit Sept 2021  Vaping Use   Vaping Use: Former  Substance and Sexual Activity   Alcohol use: Yes    Alcohol/week: 0.0 standard drinks    Comment: occassionally   Drug use: No   Sexual activity: Not Currently  Other Topics Concern   Not on file  Social History Narrative   Lives at home with wife and his dad's wife.        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    Social Determinants of Health   Financial Resource Strain: Not on file  Food Insecurity: Not on file  Transportation Needs: Not on file  Physical Activity: Not on file  Stress: Not on file  Social Connections: Not on file  Intimate Partner Violence: Not on file    Family History:    Family History  Problem Relation Age of Onset   Diabetes Mother    Diabetes Mellitus II Mother    Hypothyroidism Mother    Hypertension  Mother    Kidney failure Mother        Dialysis   Heart attack Mother        46 yo approximately   Hypertension Father    Gout Father    Cancer Maternal Grandfather    Diabetes Maternal Grandfather    Cancer Paternal Aunt      ROS:  Please see the history of present illness.  Review of Systems  Constitutional: Negative.        Weight gain  HENT: Negative.    Respiratory:  Positive for cough and shortness of breath.   Cardiovascular:  Positive for chest pain.  Gastrointestinal: Negative.   Musculoskeletal: Negative.   Neurological: Negative.   Psychiatric/Behavioral: Negative.    All other systems reviewed and are negative.   Physical Exam/Data:   Vitals:   09/27/21 1300 09/27/21 1321 09/27/21 1341 09/27/21 1631  BP: (!) 154/98  (!) 160/91 (!) 155/86  Pulse: 79 75 80 73  Resp: (!) 21 17 19 18   Temp:   97.8 F (36.6 C) 97.7 F (36.5 C)  TempSrc:      SpO2: 93% 95% 96% 95%  Weight:   111.7 kg   Height:        Intake/Output Summary (Last 24 hours) at 09/27/2021 1859 Last data filed at 09/27/2021 1840 Gross per 24 hour  Intake 480 ml  Output 3775 ml  Net -3295 ml   Last 3 Weights 09/27/2021 09/27/2021 09/05/2021  Weight (lbs) 246 lb 4.8 oz 243 lb 6.2 oz 243 lb 4.8 oz  Weight (kg) 111.721 kg 110.4 kg 110.36 kg     Body mass index is 33.4 kg/m.  General:  Well nourished, well developed, in no acute distress HEENT: normal Neck:  JVD difficult to estimate Vascular: No carotid bruits; Distal pulses 2+ bilaterally Cardiac:  normal S1, S2; RRR; no murmur  Lungs:  clear to auscultation bilaterally, scattered Rales Abd: soft, nontender, no hepatomegaly  Ext: no edema Musculoskeletal:  No deformities, BUE and BLE strength normal and equal Skin: warm and dry  Neuro:  CNs 2-12 intact, no focal abnormalities noted Psych:  Normal affect   EKG:  The EKG was personally reviewed and demonstrates:   Normal sinus rhythm rate 82 bpm nonspecific ST-T wave abnormality precordial  leads, inferior leads  Telemetry:  Telemetry was personally reviewed and demonstrates:   Normal sinus rhythm  Relevant CV Studies:  Echocardiogram October 2022,  ejection fraction 40% up to 45%, Normal RV function  Laboratory Data:  High Sensitivity Troponin:   Recent Labs  Lab 09/27/21 0956 09/27/21 1258 09/27/21 1354  TROPONINIHS 43* 45* 43*     Chemistry Recent Labs  Lab 09/27/21 0956 09/27/21 1130  NA 139  --   K 4.1  --   CL 106  --   CO2 25  --   GLUCOSE 314*  --   BUN 24*  --   CREATININE 1.04  --   CALCIUM 9.0  --   MG  --  1.8  GFRNONAA >60  --   ANIONGAP 8  --     No results for input(s): PROT, ALBUMIN, AST, ALT, ALKPHOS, BILITOT in the last 168 hours. Lipids No results for input(s): CHOL, TRIG, HDL, LABVLDL, LDLCALC, CHOLHDL in the last 168 hours.  Hematology Recent Labs  Lab 09/27/21 0956  WBC 8.5  RBC 5.26  HGB 14.9  HCT 44.2  MCV 84.0  MCH 28.3  MCHC 33.7  RDW 14.0  PLT 147*   Thyroid No results for input(s): TSH, FREET4 in the last 168 hours.  BNP Recent Labs  Lab 09/27/21 0956  BNP 1,008.2*    DDimer No results for input(s): DDIMER in the last 168 hours.   Radiology/Studies:  DG Chest 2 View  Result Date: 09/27/2021 CLINICAL DATA:  chest pain, SHOB EXAM: CHEST - 2 VIEW COMPARISON:  08/09/2021. FINDINGS: Linear opacity left midlung, compatible with subsegmental atelectasis. No confluent consolidation. No visible pleural effusions or pneumothorax. Similar cardiomediastinal silhouette. IMPRESSION: Left midlung subsegmental atelectasis. Otherwise, low lung volumes without evidence of acute cardiopulmonary disease. Electronically Signed   By: Margaretha Sheffield M.D.   On: 09/27/2021 10:49     Assessment and Plan:   Acute on chronic diastolic and systolic CHF Recent hospitalization for similar symptoms December 2022, frequent readmissions BNP 1000, baseline typically around 400 (several months ago was 2500) Resting comfortably on  room air though short of breath on exertion -Has not been titrating his Lasix, only taking 40 up to 60 recently daily -Reports taking metolazone approximately once per week Has not been taking any Lasix in the afternoon or more frequent metolazone to adjust for weight gain -Prior history medication noncompliance, poorly controlled hypertension -Presenting with systolic pressures close to 180/100 Blood pressure remains elevated -CHF education continued, given frequent hospital admissions he is aware of needing to titrate diuretics based on his weight and fluid intake We have previously stressed need for fluid restriction, diet modification, weight loss -Options for him are numerous, would consider adding extra Lasix 40 every other day in the afternoon -Alternatively metolazone 3 days a week (currently taking once a week) As a last resort recommended he call CHF clinic and arrange IV Lasix at least once a week through same-day surgery  Coronary artery disease with stable angina Cardiac enzymes nontrending No further ischemic work-up at this time, prior ejection fraction 40 to 45% Chest pain is atypical in nature No change on EKG  He has had hypertension Continue Coreg 6.25 twice daily, Entresto 24/26 twice daily If blood pressure continues to run high could increase dose of Entresto, carvedilol  Type 2 diabetes with complications Weight trending high,  A1c 8.1 Dietary changes recommended On Humalog, Trulicity, glargine insulin  History of PE On Xarelto  Hyperlipidemia Crestor, fenofibrate  Long discussion with him concerning CHF, management, etiology of his symptoms  Total encounter time more than 110 minutes  Greater than 50% was spent in counseling and coordination of care with the patient   For questions or updates, please contact [place] Please consult [email or web address] for contact info under    Signed, [name], MD  09/27/2021 6:59 PM

## 2021-09-27 NOTE — ED Provider Notes (Signed)
[facility] Provider Note    Event Date/Time   First MD Initiated Contact with Patient 09/27/21 1004     (approximate)   History   Chest Pain and Epistaxis   HPI  Gabriel [name]. is a 51 y.o. male  with medical history significant for coronary artery disease (cardiac catheterization 06/08/2020 mild to moderate CAD with total occlusion RCA, has never had stents), CHF, mitral regurgitation, recurrent pulmonary emboli (May and October 2021) treated with Xarelto, OSA on CPAP,  , COPD, DM, HTN, and recent admission in December for acute CHF exacerbation who presents for assessment of approximately 4 days of chest pain associate with shortness of breath and several episodes of epistaxis.  Patient states he has some chest pain intermittently at rest as well as shortness of breath but they are much worse with minimal exertion over the last couple days.  He also states he has had nearly a dozen bloody noses mostly on the right nare over the last day or so primarily associate with coughing.  He denies any back pain, abdominal pain, vomiting, diarrhea, urinary symptoms, rash or extremity pain.  He does not think his legs are any more swollen than usual and has not been keeping careful track of his weight.  He does endorse some orthopnea.  He has been taking his medications including his Xarelto last night as well as his Entresto Lasix and Coreg this morning.  He denies tobacco abuse, EtOH use illicit drug use.      Physical Exam  Triage Vital Signs: ED Triage Vitals  Enc Vitals Group     BP 09/27/21 0957 (!) 170/99     Pulse Rate 09/27/21 0957 81     Resp 09/27/21 0957 (!) 26     Temp 09/27/21 0957 98.3 F (36.8 C)     Temp Source 09/27/21 0957 Oral     SpO2 09/27/21 0957 93 %     Weight 09/27/21 0955 243 lb 6.2 oz (110.4 kg)     Height 09/27/21 0955 6' (1.829 m)     Head Circumference --      Peak Flow --      Pain Score 09/27/21 0955 9     Pain Loc --       Pain Edu? --      Excl. in [place]? --     Most recent vital signs: Vitals:   09/27/21 1121 09/27/21 1122  BP: (!) 158/91   Pulse: 77 78  Resp: (!) 26 20  Temp:    SpO2: 91% 95%    General: Awake, appears uncomfortable. CV:  Good peripheral perfusion.  Audible murmur.  Regular rhythm 2+ radial pulses. Resp:  Normal effort.  Question faint Rales at the bases. Abd:  No distention.  Soft. Other:  No significant lower extremity edema.  There are some dried blood in the right naris without any active epistaxis.   ED Results / Procedures / Treatments  Labs (all labs ordered are listed, but only abnormal results are displayed) Labs Reviewed  BASIC METABOLIC PANEL - Abnormal; Notable for the following components:      Result Value   Glucose, Bld 314 (*)    BUN 24 (*)    All other components within normal limits  CBC - Abnormal; Notable for the following components:   Platelets 147 (*)    All other components within normal limits  BRAIN NATRIURETIC PEPTIDE - Abnormal; Notable for the following components:   B  Natriuretic Peptide 1,008.2 (*)    All other components within normal limits  TROPONIN I (HIGH SENSITIVITY) - Abnormal; Notable for the following components:   Troponin I (High Sensitivity) 43 (*)    All other components within normal limits  RESP PANEL BY RT-PCR (FLU A&B, COVID) ARPGX2  PROTIME-INR  APTT  MAGNESIUM  HEMOGLOBIN A1C  TROPONIN I (HIGH SENSITIVITY)     EKG  ECG is remarkable sinus rhythm with a ventricular rate of 82, right axis deviation, unremarkable intervals with some fairly diffuse nonspecific ST changes in inferior lateral anterior leads V3.   RADIOLOGY   Chest x-ray on my interpretation has stable cardiomegaly and some atelectasis in the left lung without overt edema, effusion, pneumothorax, focal consolidation or other clear acute process.  I also reviewed radiology interpretation and agree with the findings.  PROCEDURES:  Critical Care  performed: No  .1-3 Lead EKG Interpretation Performed by: Lucrezia Starch, MD Authorized by: Lucrezia Starch, MD     Interpretation: normal     ECG rate assessment: normal     Rhythm: sinus rhythm     Ectopy: none     Conduction: normal    The patient is on the cardiac monitor to evaluate for evidence of arrhythmia and/or significant heart rate changes.   MEDICATIONS ORDERED IN ED: Medications  nitroGLYCERIN (NITROSTAT) SL tablet 0.4 mg (0.4 mg Sublingual Given 09/27/21 1123)  insulin aspart (novoLOG) injection 0-15 Units (has no administration in time range)  fentaNYL (SUBLIMAZE) injection 50 mcg (has no administration in time range)  albuterol (PROVENTIL) (2.5 MG/3ML) 0.083% nebulizer solution 2.5 mg (has no administration in time range)  dextromethorphan-guaiFENesin (MUCINEX DM) 30-600 MG per 12 hr tablet 1 tablet (has no administration in time range)  ipratropium-albuterol (DUONEB) 0.5-2.5 (3) MG/3ML nebulizer solution 3 mL (has no administration in time range)  ondansetron (ZOFRAN) injection 4 mg (has no administration in time range)  acetaminophen (TYLENOL) tablet 650 mg (has no administration in time range)  hydrALAZINE (APRESOLINE) injection 5 mg (has no administration in time range)  furosemide (LASIX) injection 80 mg (80 mg Intravenous Given 09/27/21 1127)     IMPRESSION / MDM / ASSESSMENT AND PLAN / ED COURSE  I reviewed the triage vital signs and the nursing notes.                              With regard to patient's chest pain shortness of breath differential diagnosis includes, but is not limited to ACS, pneumonia, bronchitis, CHF, arrhythmia, anemia, metabolic derangements with lower suspicion at this time for PE given absence of new hypoxia and patient reporting compliance with Xarelto.  He describes his pain as pressure-like and fairly exertional and have a lower suspicion for dissection at this time.  I suspect his recurrent epistaxis of last couple days is  multifactorial with contributing factors including being anticoagulant Eliquis, recent dry air and some congestion coughing which may have irritated his nose.  ECG is remarkable sinus rhythm with a ventricular rate of 82, right axis deviation, unremarkable intervals with some fairly diffuse nonspecific ST changes in inferior lateral anterior leads V3.  Troponin is elevated but stable compared to baseline at 43.  Over the last several months it has ranged in the high 40s to mid 50s.   Chest x-ray on my interpretation has stable cardiomegaly and some atelectasis in the left lung without overt edema, effusion, pneumothorax, focal consolidation or other clear  acute process.  BMP shows no significant electrolyte or metabolic derangements although patient is hyperglycemic with a glucose of 314.  No evidence of acidosis to suggest DKA.  Patient started on sliding scale insulin.  CBC without leukocytosis or acute anemia.  Platelets are 147.  BNP is quite elevated at 1000 compared to 47 1 month ago.  Given overall patient's symptoms and history I am concerned for acute on chronic heart failure exacerbation.  We will start patient on IV Lasix.  Exam is within normal limits.  My assessment after nitroglycerin patient still has some chest pressure but states the nitro given headache and does not want any more of this.  Will defer full dose ASA given patient is on Xarelto and has had about a dozen nosebleeds over the last 2 to 3 days.  I discussed this with on-call cardiologist [name] who agreed with this and primary management of diuresis.  I will admit to medicine service for further evaluation and management of acute on chronic heart failure constipation.   FINAL CLINICAL IMPRESSION(S) / ED DIAGNOSES   Final diagnoses:  SOB (shortness of breath)  Chest pain, unspecified type  Troponin I above reference range  Hyperglycemia  Anticoagulated  Epistaxis  Hypertension, unspecified type  Acute on chronic  congestive heart failure, unspecified heart failure type ([place])     Rx / DC Orders   ED Discharge Orders     None        Note:  This document was prepared using Dragon voice recognition software and may include unintentional dictation errors.   Lucrezia Starch, MD 09/27/21 [phone]

## 2021-09-27 NOTE — ED Triage Notes (Signed)
Pt comes into the ED via POV c/o chest pain, epistaxis, and increased sHOB with any exertion.  Pt is currently a CHF patient, but denies any known weight gain.  PT presents to the triage room labored after a short walk.  Pt is currently on blood thinners and states that he has been having epistaxis multiple times.  PT states the chest pain in centrally located and diffuses across the chest.  Pt denies any nausea, but he does have dizziness with a change of positions.

## 2021-09-28 DIAGNOSIS — I5023 Acute on chronic systolic (congestive) heart failure: Secondary | ICD-10-CM

## 2021-09-28 DIAGNOSIS — R0789 Other chest pain: Secondary | ICD-10-CM

## 2021-09-28 LAB — GLUCOSE, CAPILLARY
Glucose-Capillary: 136 mg/dL — ABNORMAL HIGH (ref 70–99)
Glucose-Capillary: 146 mg/dL — ABNORMAL HIGH (ref 70–99)
Glucose-Capillary: 252 mg/dL — ABNORMAL HIGH (ref 70–99)
Glucose-Capillary: 198 mg/dL — ABNORMAL HIGH (ref 70–99)
Glucose-Capillary: 176 mg/dL — ABNORMAL HIGH (ref 70–99)
Glucose-Capillary: 305 mg/dL — ABNORMAL HIGH (ref 70–99)

## 2021-09-28 LAB — HEPATIC FUNCTION PANEL
ALT: 17 U/L (ref 0–44)
AST: 17 U/L (ref 15–41)
Albumin: 3.8 g/dL (ref 3.5–5.0)
Alkaline Phosphatase: 51 U/L (ref 38–126)
Bilirubin, Direct: 0.2 mg/dL (ref 0.0–0.2)
Indirect Bilirubin: 1 mg/dL — ABNORMAL HIGH (ref 0.3–0.9)
Total Bilirubin: 1.2 mg/dL (ref 0.3–1.2)
Total Protein: 7.1 g/dL (ref 6.5–8.1)

## 2021-09-28 LAB — BASIC METABOLIC PANEL
Anion gap: 10 (ref 5–15)
BUN: 27 mg/dL — ABNORMAL HIGH (ref 6–20)
CO2: 28 mmol/L (ref 22–32)
Calcium: 9.5 mg/dL (ref 8.9–10.3)
Chloride: 102 mmol/L (ref 98–111)
Creatinine, Ser: 1.07 mg/dL (ref 0.61–1.24)
GFR, Estimated: >60 mL/min (ref >60)
Glucose, Bld: 144 mg/dL — ABNORMAL HIGH (ref 70–99)
Potassium: 3.5 mmol/L (ref 3.5–5.1)
Sodium: 140 mmol/L (ref 135–145)

## 2021-09-28 LAB — LIPASE, BLOOD: Lipase: 35 U/L (ref 11–51)

## 2021-09-28 LAB — LIPID PANEL
Cholesterol: 114 mg/dL (ref 0–200)
HDL: 24 mg/dL — ABNORMAL LOW (ref >40)
LDL Cholesterol: 52 mg/dL (ref 0–99)
Total CHOL/HDL Ratio: 4.8 RATIO
Triglycerides: 190 mg/dL — ABNORMAL HIGH (ref <150)
VLDL: 38 mg/dL (ref 0–40)

## 2021-09-28 LAB — MAGNESIUM: Magnesium: 1.9 mg/dL (ref 1.7–2.4)

## 2021-09-28 MED ORDER — POTASSIUM CHLORIDE CRYS ER 20 MEQ PO TBCR
40.0000 meq | EXTENDED_RELEASE_TABLET | Freq: Once | ORAL | Status: AC
  Administered 2021-09-28: 40 meq via ORAL
  Filled 2021-09-28: qty 2

## 2021-09-28 MED ORDER — ALUM & MAG HYDROXIDE-SIMETH 200-200-20 MG/5ML PO SUSP
15.0000 mL | Freq: Once | ORAL | Status: AC
  Administered 2021-09-28: 15 mL via ORAL
  Filled 2021-09-28: qty 30

## 2021-09-28 MED ORDER — ACETAMINOPHEN 325 MG PO TABS: 650.0000 mg | ORAL_TABLET | Freq: Four times a day (QID) | ORAL | Status: DC | PRN

## 2021-09-28 MED ORDER — INSULIN ASPART 100 UNIT/ML IJ SOLN
0.0000 [IU] | Freq: Three times a day (TID) | INTRAMUSCULAR | Status: DC
  Administered 2021-09-28: 11 [IU] via SUBCUTANEOUS
  Administered 2021-09-29: 3 [IU] via SUBCUTANEOUS
  Filled 2021-09-28 (×2): qty 1

## 2021-09-28 NOTE — Assessment & Plan Note (Signed)
-   Continue with Crestor and fenofibrate

## 2021-09-28 NOTE — Assessment & Plan Note (Signed)
No concern of exacerbation. -Continue home bronchodilators

## 2021-09-28 NOTE — Assessment & Plan Note (Signed)
Mildly positive troponin in 40s with a flat curve.  Most likely secondary to demand ischemia.

## 2021-09-28 NOTE — Hospital Course (Signed)
Gabriel [name]. is a 51 y.o. 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 Also endorsed some substernal chest pain, pressure-like, intermittent, nonradiating, no significant relationship with exertion. Worsening lower extremity edema, stating that Lasix does not work much for him.  Cardiology was consulted and there was some issues being noncompliant.  Multiple hospitalizations, they were suggesting adding metolazone or going to heart failure clinic for IV Lasix once a week. Patient used to take torsemide before, stating that torsemide seems working better for him.  On arrival he was hemodynamically stable.  Labs pertinent for elevated BNP at 1008.  Troponin mildly elevated at 43 with a flat curve.  Chest x-ray without any obvious abnormality and EKG with right axis deviation, poor R wave progression, ST depression in inferior leads and V3 to V6.  Admitted for acute on chronic HFrEF and started on IV diuresis with a very good response.  Repeat echocardiogram with a EF of 40 to 45%, mild biventricular dilated cardiomyopathy.

## 2021-09-28 NOTE — Assessment & Plan Note (Signed)
Blood pressure within goal. -Continue with home Entresto -As needed hydralazine

## 2021-09-28 NOTE — Progress Notes (Signed)
Progress Note  Patient Name: Gabriel [name]. Date of Encounter: 09/28/2021  Primary Cardiologist: Rockey Situ  Subjective   Dyspnea is improving.  He does note right upper quadrant and right-sided chest discomfort after eating.  No lower extremity swelling.  Documented urine output 4.5 L for the past 24 hours with a net -4.7 L for the admission.  Renal function stable.  Inpatient Medications    Scheduled Meds:  carvedilol  6.25 mg Oral BID WC   fenofibrate  160 mg Oral Daily   furosemide  40 mg Intravenous Q12H   insulin aspart  0-15 Units Subcutaneous Q4H   insulin glargine-yfgn  22 Units Subcutaneous QHS   rivaroxaban  20 mg Oral Q supper   rosuvastatin  20 mg Oral QPM   sacubitril-valsartan  1 tablet Oral BID   Continuous Infusions:  PRN Meds: acetaminophen, albuterol, dextromethorphan-guaiFENesin, hydrALAZINE, HYDROmorphone (DILAUDID) injection, nitroGLYCERIN, ondansetron (ZOFRAN) IV   Vital Signs    Vitals:   09/27/21 2343 09/28/21 0234 09/28/21 0810 09/28/21 1000  BP: (!) 124/59 138/72 133/80 130/80  Pulse: 62 62 66 66  Resp: 18 20 20 20   Temp: 98 F (36.7 C) 97.9 F (36.6 C) 98.3 F (36.8 C) 98.3 F (36.8 C)  TempSrc:      SpO2: 95% 92% 93% 93%  Weight:    109.1 kg  Height:        Intake/Output Summary (Last 24 hours) at 09/28/2021 1538 Last data filed at 09/28/2021 1300 Gross per 24 hour  Intake 1440 ml  Output 4400 ml  Net -2960 ml   Filed Weights   09/27/21 0955 09/27/21 1341 09/28/21 1000  Weight: 110.4 kg 111.7 kg 109.1 kg    Telemetry    Sinus rhythm with PVCs and a 12 beat run of NSVT - Personally Reviewed  ECG    No new tracings - Personally Reviewed  Physical Exam   GEN: No acute distress.   Neck: JVD difficult to assess secondary to body habitus. Cardiac: RRR, no murmurs, rubs, or gallops.  Respiratory: Diminished breath sounds along the bases bilaterally.  GI: Soft, nontender, non-distended.   MS: No edema; No  deformity. Neuro:  Alert and oriented x 3; Nonfocal.  Psych: Normal affect.  Labs    Chemistry Recent Labs  Lab 09/27/21 [phone] 09/28/21 0538 09/28/21 0638  NA 139 140  --   K 4.1 3.5  --   CL 106 102  --   CO2 25 28  --   GLUCOSE 314* 144*  --   BUN 24* 27*  --   CREATININE 1.04 1.07  --   CALCIUM 9.0 9.5  --   PROT  --   --  7.1  ALBUMIN  --   --  3.8  AST  --   --  17  ALT  --   --  17  ALKPHOS  --   --  51  BILITOT  --   --  1.2  GFRNONAA >60 >60  --   ANIONGAP 8 10  --      Hematology Recent Labs  Lab 09/27/21 0956  WBC 8.5  RBC 5.26  HGB 14.9  HCT 44.2  MCV 84.0  MCH 28.3  MCHC 33.7  RDW 14.0  PLT 147*    Cardiac EnzymesNo results for input(s): TROPONINI in the last 168 hours. No results for input(s): TROPIPOC in the last 168 hours.   BNP Recent Labs  Lab 09/27/21 0956  BNP 1,008.2*  DDimer No results for input(s): DDIMER in the last 168 hours.   Radiology    DG Chest 2 View  Result Date: 09/27/2021 IMPRESSION: Left midlung subsegmental atelectasis. Otherwise, low lung volumes without evidence of acute cardiopulmonary disease. Electronically Signed   By: Margaretha Sheffield M.D.   On: 09/27/2021 10:49    Cardiac Studies   2D echo 05/21/2021: 1. Left ventricular ejection fraction, by estimation, is 40 to 45%. The  left ventricle has mildly decreased function. Left ventricular endocardial  border not optimally defined to evaluate regional wall motion. The left  ventricular internal cavity size  was mildly dilated. There is mild left ventricular hypertrophy. Left  ventricular diastolic parameters were normal.   2. Right ventricular systolic function is normal. The right ventricular  size is normal. Tricuspid regurgitation signal is inadequate for assessing  PA pressure.   3. Left atrial size was mild to moderately dilated.   4. Right atrial size was mildly dilated.   5. The mitral valve is normal in structure. Mild mitral valve   regurgitation. No evidence of mitral stenosis.   6. The aortic valve is normal in structure. Aortic valve regurgitation is  mild. Mild to moderate aortic valve sclerosis/calcification is present,  without any evidence of aortic stenosis. __________  [facility] 06/13/2020: Conclusions: Severe single-vessel coronary artery disease with chronic total occlusion of the proximal RCA.  The distal vessel fills via left to right collaterals. Mild to moderate, nonobstructive CAD involving the LAD and LCx with up to 50% stenosis. Mildly elevated left and right heart filling pressures. Moderate pulmonary hypertension (mean PAP 40 mmHg) with significantly elevated pulmonary vascular resistance (PVR 7.4 WU). Severely reduced Fick cardiac output/index.   Recommendations: Continue IV diuresis for at least 1 more day. Given severely reduced cardiac output, I will decrease carvedilol to 6.5 mg twice daily.  If blood pressure tolerates and renal function is stable, consider further escalation of Entresto. Restart IV heparin 2 hours after TR band removal.  If no evidence of bleeding or vascular complication, the patient can be transitioned back to [facility] tomorrow. 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  He was evaluated by Dr. Aundra Dubin in June but failed to follow-up since then. Aggressive secondary prevention of coronary artery disease.  Favor medical management of CTO of RCA, which is new since 2018. __________  2D echo 06/08/2020:  1. Left ventricular ejection fraction, by estimation, is 35 to 40%. The  left ventricle has moderately decreased function. The left ventricle  demonstrates global hypokinesis. The left ventricular internal cavity size  was moderately dilated. Left  ventricular diastolic parameters are indeterminate.   2. Right ventricular systolic function is  normal. The right ventricular  size is normal. There is mildly elevated pulmonary artery systolic  pressure. The estimated right ventricular systolic pressure is 11.9 mmHg.   3. Left atrial size was moderately dilated.   4. Mild to moderate mitral valve regurgitation. __________  Patient Profile     51 y.o. male with history of CAD with CTO of the proximal RCA, chronic chest pain with chronic troponin elevation, HFrEF secondary to NICM with frequent hospital admissions, NSVT, CKD stage III, COPD, chronic anticoagulation secondary to unprovoked PE, DM2, OSA declined CPAP, and obesity who is being seen today for the evaluation of acute on chronic HFrEF.  Assessment & Plan    1.  Acute on chronic HFrEF secondary to nonischemic  cardiomyopathy: -Volume status is improving, though he does remain volume up -Prior history of medication nonadherence, though he reports taking medications most recently -He has had vigorous urine output over the past 24 hours with a documented urine output of 4.5 L -Continue IV Lasix 40 mg twice daily for at least another 24 hours -Continue carvedilol and Entresto -Could consider referral to the advanced heart failure clinic as an outpatient for CardioMEMS -Relative hypotension has previously precluded escalation of GDMT including SGLT2 inhibitor -If BP allows would recommend retrial of SGLT2 inhibitor or addition of spironolactone -CHF education -Daily standing scale weights  2.  CAD involving the native coronary arteries with minimal high-sensitivity troponin elevation: -He does note right upper quadrant and right-sided chest discomfort that is not concerning for angina given this is associated with food consumption -Minimal high-sensitivity troponin elevation not concerning for ACS -No indication for heparin drip -No plans for inpatient ischemic evaluation -PTA Xarelto in place of aspirin to minimize bleeding risk  3.  History of unprovoked recurrent PE: -PTA  Xarelto  4.  HTN: -Well-controlled -Continue current therapy as outlined above  5.  Right upper quadrant/right-sided chest discomfort: -Only noticeable after eating -Check LFT and lipase -Trial of Maalox -Primary service to consider ultrasound  6.  HLD: -PTA Crestor        For questions or updates, please contact [place] Please consult [email or web address] for contact info under Cardiology/STEMI.    Signed, Christell Faith, PA-C [place] Pager: [phone] 09/28/2021, 3:38 PM

## 2021-09-28 NOTE — Assessment & Plan Note (Signed)
-   Continue home Xarelto 

## 2021-09-28 NOTE — Assessment & Plan Note (Signed)
Poorly controlled diabetes with A1c of 8.1 and hyperglycemia. Patient was taking Humalog, Trulicity and glargine 28 units daily. -Continue glargine and SSI

## 2021-09-28 NOTE — Assessment & Plan Note (Signed)
Renal function seems stable and at baseline. -Monitor renal function while patient is being diuresed -Avoid nephrotoxins

## 2021-09-28 NOTE — Progress Notes (Signed)
PROGRESS NOTE    Gabriel [name].  FVC:[id number] DOB: 1971-06-09 DOA: 09/27/2021 PCP: [name], FNP   Brief Narrative:  Gabriel [name]. is a 51 y.o. 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 Also endorsed some substernal chest pain, pressure-like, intermittent, nonradiating, no significant relationship with exertion. Worsening lower extremity edema, stating that Lasix does not work much for him.  Cardiology was consulted and there was some issues being noncompliant.  Multiple hospitalizations, they were suggesting adding metolazone or going to heart failure clinic for IV Lasix once a week. Patient used to take torsemide before, stating that torsemide seems working better for him.  On arrival he was hemodynamically stable.  Labs pertinent for elevated BNP at 1008.  Troponin mildly elevated at 43 with a flat curve.  Chest x-ray without any obvious abnormality and EKG with right axis deviation, poor R wave progression, ST depression in inferior leads and V3 to V6.  Admitted for acute on chronic HFrEF and started on IV diuresis with a very good response.  Repeat echocardiogram with a EF of 40 to 45%, mild biventricular dilated cardiomyopathy.    Subjective: Patient was seen and examined today.  Breathing seems improving.  Lower extremity edema improving.  We discussed about following up in advanced heart failure clinic regularly.  Per patient torsemide worked better for him.  Assessment & Plan:   Principal Problem:   Acute on chronic systolic CHF (congestive heart failure) (HCC) Active Problems:   Elevated troponin   CAD (coronary artery disease)   Essential hypertension   Mixed hyperlipidemia   CKD (chronic kidney disease), stage  IIIa   History of pulmonary embolism   Type II diabetes mellitus with renal manifestations (HCC)   COPD (chronic obstructive pulmonary disease) (HCC)   Chest pain   Epistaxis  Assessment and Plan: * Acute on chronic systolic CHF (congestive heart failure) ([place])- (present on admission) Repeat echocardiogram with similar findings of prior echo done in October 2022 with EF of 40 to 45% and mildly dilated biventricular cardiomyopathy. Patient responded very well to IV Lasix, output of more than 5 L with significant improvement in lower extremity edema.  Chest clear. Cardiology was also consulted-appreciate their recommendations. -Continue IV Lasix 40 mg twice daily for another day-Will discharge on torsemide 40 mg twice daily. -Continue with Entresto -Daily weight -Strict intake and output -Fluid restriction  CAD (coronary artery disease)- (present on admission) Patient has an history of CAD, having some atypical chest pain.  Currently chest pain-free.  Cardiac cath done in 2021 with total occlusion of RCA. -Continue with Crestor and fenofibrate. -As needed nitroglycerin  Elevated troponin- (present on admission) Mildly positive troponin in 40s with a flat curve.  Most likely secondary to demand ischemia.  Essential hypertension- (present on admission) Blood pressure within goal. -Continue with home Entresto -As needed hydralazine  Mixed hyperlipidemia- (present on admission) - Continue with Crestor and fenofibrate  CKD (chronic kidney disease), stage IIIa- (present on admission) Renal function seems stable and at baseline. -Monitor renal function while patient is being diuresed -Avoid nephrotoxins  History of pulmonary embolism- (present on admission) - Continue home Xarelto  Type II diabetes mellitus with renal manifestations ([place])- (present on admission) Poorly controlled diabetes with A1c of 8.1 and hyperglycemia. Patient was taking Humalog, Trulicity  and glargine 28 units  daily. -Continue glargine and SSI  COPD (chronic obstructive pulmonary disease) ([place])- (present on admission) No concern of exacerbation. -Continue home bronchodilators   Objective: Vitals:   09/27/21 2343 09/28/21 0234 09/28/21 0810 09/28/21 1000  BP: (!) 124/59 138/72 133/80 130/80  Pulse: 62 62 66 66  Resp: 18 20 20 20   Temp: 98 F (36.7 C) 97.9 F (36.6 C) 98.3 F (36.8 C) 98.3 F (36.8 C)  TempSrc:      SpO2: 95% 92% 93% 93%  Weight:    109.1 kg  Height:        Intake/Output Summary (Last 24 hours) at 09/28/2021 1529 Last data filed at 09/28/2021 1300 Gross per 24 hour  Intake 1440 ml  Output 4400 ml  Net -2960 ml   Filed Weights   09/27/21 0955 09/27/21 1341 09/28/21 1000  Weight: 110.4 kg 111.7 kg 109.1 kg    Examination:  General exam: Appears calm and comfortable  Respiratory system: Clear to auscultation. Respiratory effort normal. Cardiovascular system: S1 & S2 heard, RRR. No JVD, murmurs, rubs, gallops or clicks. Gastrointestinal system: Soft, nontender, nondistended, bowel sounds positive. Central nervous system: Alert and oriented. No focal neurological deficits. Extremities: Trace LE edema, no cyanosis, pulses intact and symmetrical. Psychiatry: Judgement and insight appear normal.    DVT prophylaxis: Xarelto Code Status: Full Family Communication: Discussed with patient Disposition Plan:  Status is: Inpatient Remains inpatient appropriate because: Severity of illness    Level of care: Progressive  All the records are reviewed and case discussed with Care Management/Social Worker. Management plans discussed with the patient, nursing and they are in agreement.  Consultants:   Cardiology  Procedures:  Antimicrobials:   Data Reviewed: I have personally reviewed following labs and imaging studies  CBC: Recent Labs  Lab 09/27/21 0956  WBC 8.5  HGB 14.9  HCT 44.2  MCV 84.0  PLT 201*   Basic Metabolic Panel: Recent Labs  Lab  09/27/21 0956 09/27/21 1130 09/28/21 0538  NA 139  --  140  K 4.1  --  3.5  CL 106  --  102  CO2 25  --  28  GLUCOSE 314*  --  144*  BUN 24*  --  27*  CREATININE 1.04  --  1.07  CALCIUM 9.0  --  9.5  MG  --  1.8 1.9   GFR: Estimated Creatinine Clearance: 103 mL/min (by C-G formula based on SCr of 1.07 mg/dL). Liver Function Tests: Recent Labs  Lab 09/28/21 0638  AST 17  ALT 17  ALKPHOS 51  BILITOT 1.2  PROT 7.1  ALBUMIN 3.8   Recent Labs  Lab 09/28/21 0638  LIPASE 35   No results for input(s): AMMONIA in the last 168 hours. Coagulation Profile: Recent Labs  Lab 09/27/21 1130  INR 1.0   Cardiac Enzymes: No results for input(s): CKTOTAL, CKMB, CKMBINDEX, TROPONINI in the last 168 hours. BNP (last 3 results) No results for input(s): PROBNP in the last 8760 hours. HbA1C: Recent Labs    09/27/21 1354  HGBA1C 8.2*   CBG: Recent Labs  Lab 09/27/21 2344 09/28/21 0351 09/28/21 0808 09/28/21 0840 09/28/21 1246  GLUCAP 226* 136* 146* 252* 198*   Lipid Profile: Recent Labs    09/28/21 0538  CHOL 114  HDL 24*  LDLCALC 52  TRIG 190*  CHOLHDL 4.8   Thyroid Function Tests: No results for input(s): TSH, T4TOTAL, FREET4, T3FREE, THYROIDAB in the last 72 hours. Anemia Panel: No results  for input(s): VITAMINB12, FOLATE, FERRITIN, TIBC, IRON, RETICCTPCT in the last 72 hours. Sepsis Labs: No results for input(s): PROCALCITON, LATICACIDVEN in the last 168 hours.  Recent Results (from the past 240 hour(s))  Resp Panel by RT-PCR (Flu A&B, Covid) Nasopharyngeal Swab     Status: None   Collection Time: 09/27/21 11:30 AM   Specimen: Nasopharyngeal Swab; Nasopharyngeal(NP) swabs in vial transport medium  Result Value Ref Range Status   SARS Coronavirus 2 by RT PCR NEGATIVE NEGATIVE Final    Comment: (NOTE) SARS-CoV-2 target nucleic acids are NOT DETECTED.  The SARS-CoV-2 RNA is generally detectable in upper respiratory specimens during the acute phase of  infection. The lowest concentration of SARS-CoV-2 viral copies this assay can detect is 138 copies/mL. A negative result does not preclude SARS-Cov-2 infection and should not be used as the sole basis for treatment or other patient management decisions. A negative result may occur with  improper specimen collection/handling, submission of specimen other than nasopharyngeal swab, presence of viral mutation(s) within the areas targeted by this assay, and inadequate number of viral copies(<138 copies/mL). A negative result must be combined with clinical observations, patient history, and epidemiological information. The expected result is Negative.  Fact Sheet for Patients:  EntrepreneurPulse.com.au  Fact Sheet for Healthcare Providers:  IncredibleEmployment.be  This test is no t yet approved or cleared by the [place] FDA and  has been authorized for detection and/or diagnosis of SARS-CoV-2 by FDA under an Emergency Use Authorization (EUA). This EUA will remain  in effect (meaning this test can be used) for the duration of the COVID-19 declaration under Section 564(b)(1) of the Act, 21 U.S.C.section 360bbb-3(b)(1), unless the authorization is terminated  or revoked sooner.       Influenza A by PCR NEGATIVE NEGATIVE Final   Influenza B by PCR NEGATIVE NEGATIVE Final    Comment: (NOTE) The Xpert Xpress SARS-CoV-2/FLU/RSV plus assay is intended as an aid in the diagnosis of influenza from Nasopharyngeal swab specimens and should not be used as a sole basis for treatment. Nasal washings and aspirates are unacceptable for Xpert Xpress SARS-CoV-2/FLU/RSV testing.  Fact Sheet for Patients: EntrepreneurPulse.com.au  Fact Sheet for Healthcare Providers: IncredibleEmployment.be  This test is not yet approved or cleared by the [place] FDA and has been authorized for detection and/or diagnosis of SARS-CoV-2  by FDA under an Emergency Use Authorization (EUA). This EUA will remain in effect (meaning this test can be used) for the duration of the COVID-19 declaration under Section 564(b)(1) of the Act, 21 U.S.C. section 360bbb-3(b)(1), unless the authorization is terminated or revoked.  Performed at [facility], [address], [place], [place] [address]      Radiology Studies: DG Chest 2 View  Result Date: 09/27/2021 CLINICAL DATA:  chest pain, SHOB EXAM: CHEST - 2 VIEW COMPARISON:  08/09/2021. FINDINGS: Linear opacity left midlung, compatible with subsegmental atelectasis. No confluent consolidation. No visible pleural effusions or pneumothorax. Similar cardiomediastinal silhouette. IMPRESSION: Left midlung subsegmental atelectasis. Otherwise, low lung volumes without evidence of acute cardiopulmonary disease. Electronically Signed   By: Margaretha Sheffield M.D.   On: 09/27/2021 10:49    Scheduled Meds:  carvedilol  6.25 mg Oral BID WC   fenofibrate  160 mg Oral Daily   furosemide  40 mg Intravenous Q12H   insulin aspart  0-15 Units Subcutaneous Q4H   insulin glargine-yfgn  22 Units Subcutaneous QHS   rivaroxaban  20 mg Oral Q supper   rosuvastatin  20 mg Oral  QPM   sacubitril-valsartan  1 tablet Oral BID   Continuous Infusions:   LOS: 1 day   Time spent: 40 minutes More than 50% of the time was spent in counseling/coordination of care  Lorella Nimrod, MD [facility]  If 7PM-7AM, please contact night-coverage [email or web address]  09/28/2021, 3:29 PM   This record has been created using Systems analyst. Errors have been sought and corrected,but may not always be located. Such creation errors do not reflect on the standard of care.

## 2021-09-28 NOTE — Assessment & Plan Note (Signed)
Repeat echocardiogram with similar findings of prior echo done in October 2022 with EF of 40 to 45% and mildly dilated biventricular cardiomyopathy. Patient responded very well to IV Lasix, output of more than 5 L with significant improvement in lower extremity edema.  Chest clear. Cardiology was also consulted-appreciate their recommendations. -Continue IV Lasix 40 mg twice daily for another day-Will discharge on torsemide 40 mg twice daily. -Continue with Entresto -Daily weight -Strict intake and output -Fluid restriction

## 2021-09-28 NOTE — Consult Note (Addendum)
° °  Heart Failure Nurse Navigator Note  HFrEF 40 to 45%.  Mildly dilated internal left ventricular cavity size.  Mild left ventricular hypertrophy.  Mild biatrial enlargement.  Mild mitral regurgitation.  Mild aortic regurgitation.   He presented to the emergency room with progressive shortness of breath.  He also complained of chest discomfort.  BNP was elevated at 1008 chest x-ray showed low volume.  Comorbidities:  Hypertension Hyperlipidemia Chronic kidney disease stage III Type 2 diabetes COPD  Medications:  Coreg 6.25 mg 2 times a day with meals Fenofibrate 160 mg daily Furosemide 40 mg IV every 12 Xarelto 20 mg daily with supper Entresto 24/26 mg 2 times a day   Labs:  Sodium 140 potassium 3.5, chloride 102, CO 2 to, BUN 27, creatinine 1.07, magnesium 1.9 Weight is 109.1 kg Blood pressure 133/80 Intake 720 mL Output 5275 mL   Initial meeting with patient on this admission.  Last been seen in the outpatient heart failure clinic on September 05, 2016 when he had presented for follow-up visit and was complaining of moderate shortness of breath with minimal exertion.  On exam at that time he was noted to have moderate fluid overload.  At that time his furosemide was increased to 60 mg a day along with taking metolazone for the next 2 days.  He states that he did not see much change in his urine output.  He did not notify Ms. [facility] of the problem nor did he return in 1 week to be seen.   Discussed signs and symptoms and weight gains to report.  Also talked about if he has increases in his diuretic and does not see any results he should definitely be getting on the phone with the heart failure clinic or his provider.  Discussed the importance of daily weights and what to report.  He states if he is not feeling good he just does not get up out of bed and does not do weights every day.  Discussed diet.  He states that he does not use salt at the table. Stressed fluid  restriction of 64 ounces daily.   He has a follow-up appointment on February 27 at 12:00 in the outpatient heart failure clinic.   Pricilla Riffle RN [facility]

## 2021-09-28 NOTE — Assessment & Plan Note (Signed)
Patient has an history of CAD, having some atypical chest pain.  Currently chest pain-free.  Cardiac cath done in 2021 with total occlusion of RCA. -Continue with Crestor and fenofibrate. -As needed nitroglycerin

## 2021-09-28 NOTE — TOC Initial Note (Signed)
Transition of Care (TOC) - Initial/Assessment Note    Patient Details  Name: Gabriel [name]. MRN: [id number] Date of Birth: 03/31/1971  Transition of Care [facility]) CM/SW Contact:    Alberteen Sam, LCSW Phone Number: 09/28/2021, 10:49 AM  Clinical Narrative:                  Readmission risk assess completed as patient is known to unit.   PCP is Tally Joe, FNP at [facility]. Patient drives himself to appointments. [facility] [address]. No issues obtaining medications. No home health or DME use prior to admission. Patient has a scale at home and understands importance of weighing daily.   No further concerns. CSW encouraged patient to contact CSW as needed. CSW will continue to follow patient for support and facilitate return home when stable.                  Expected Discharge Plan: Home/Self Care Barriers to Discharge: Continued Medical Work up   Patient Goals and CMS Choice Patient states their goals for this hospitalization and ongoing recovery are:: to go home CMS Medicare.gov Compare Post Acute Care list provided to:: Patient Choice offered to / list presented to : Patient  Expected Discharge Plan and Services Expected Discharge Plan: Home/Self Care                                              Prior Living Arrangements/Services   Lives with:: Self                   Activities of Daily Living Home Assistive Devices/Equipment: CPAP ADL Screening (condition at time of admission) Patient's cognitive ability adequate to safely complete daily activities?: Yes Is the patient deaf or have difficulty hearing?: No Does the patient have difficulty seeing, even when wearing glasses/contacts?: No Does the patient have difficulty concentrating, remembering, or making decisions?: No Patient able to express need for assistance with ADLs?: Yes Does the patient have difficulty dressing or bathing?: No Independently performs ADLs?: Yes  (appropriate for developmental age) Does the patient have difficulty walking or climbing stairs?: No Weakness of Legs: None Weakness of Arms/Hands: None  Permission Sought/Granted                  Emotional Assessment       Orientation: : Oriented to Self, Oriented to Place, Oriented to  Time, Oriented to Situation Alcohol / Substance Use: Not Applicable Psych Involvement: No (comment)  Admission diagnosis:  Epistaxis [R04.0] SOB (shortness of breath) [R06.02] Hyperglycemia [R73.9] Anticoagulated [Z79.01] Troponin I above reference range [R77.8] Acute on chronic systolic CHF (congestive heart failure) (HCC) [I50.23] Chest pain, unspecified type [R07.9] Hypertension, unspecified type [I10] Acute on chronic congestive heart failure, unspecified heart failure type ([place]) [I50.9] Patient Active Problem List   Diagnosis Date Noted   Epistaxis 09/27/2021   CAD (coronary artery disease) 09/27/2021   Right foot pain 09/05/2021   Arch pain of right foot 09/05/2021   Elevated triglycerides with high cholesterol 09/05/2021   Memory loss or impairment 09/05/2021   Hyperlipidemia associated with type 2 diabetes mellitus ([place]) 09/05/2021   Hospital discharge follow-up 09/05/2021   Dyspnea 08/10/2021   Acute on chronic systolic (congestive) heart failure ([place]) 08/09/2021   Type II or unspecified type diabetes mellitus without mention of complication, not  stated as uncontrolled 05/21/2021   Acute on chronic diastolic CHF (congestive heart failure) ([place]) 05/21/2021   Long term current use of anticoagulant 05/20/2021   Mitral regurgitation 05/20/2021   Mediastinal lymphadenopathy 05/20/2021   Atherosclerotic heart disease of native coronary artery with other forms of angina pectoris ([place])    Acalculous cholecystitis 03/29/2021   Right upper quadrant abdominal pain 03/28/2021   AKI (acute kidney injury) ([place]) 11/24/2020   Acute kidney injury ([place]) 11/23/2020   OSA on CPAP 11/23/2020    Chest pain 10/27/2020   Snoring 08/15/2020   Type II diabetes mellitus with renal manifestations ([place]) 06/11/2020   COPD (chronic obstructive pulmonary disease) ([place]) 06/11/2020   Leukocytosis 06/11/2020   Hyperglycemia    Elevated brain natriuretic peptide (BNP) level    Uncontrolled type 2 diabetes mellitus with hyperglycemia, with long-term current use of insulin ([place]) 05/13/2020   Acute on chronic HFrEF (heart failure with reduced ejection fraction) ([place]) 05/12/2020   Erectile dysfunction due to arterial insufficiency 03/15/2020   Screening PSA (prostate specific antigen) 03/15/2020   Erectile dysfunction 03/02/2020   History of pulmonary embolism    Hemoptysis    Costochondritis    Acute on chronic combined systolic (congestive) and diastolic (congestive) heart failure ([place]) 12/20/2019   Acute on chronic heart failure ([place]) 11/22/2019   CKD (chronic kidney disease), stage IIIa 06/26/2019   Type 2 diabetes mellitus with hyperlipidemia ([place]) 06/26/2019   Hypertensive urgency 06/26/2019   Acute on chronic combined systolic and diastolic CHF (congestive heart failure) ([place]) 09/02/2017   Other chest pain 08/24/2017   Dizziness    Noncompliance with medications 04/29/2017   Back pain 03/06/2017   Tobacco use 12/04/2016   Gallbladder sludge 12/04/2016   Acute on chronic systolic CHF (congestive heart failure) ([place]) 11/28/2016   Coronary artery disease, non-occlusive 03/15/2016   Atypical chest pain 02/16/2016   Orthostatic hypotension 01/23/2016   Hypomagnesemia 71/24/5809   Chronic systolic CHF (congestive heart failure) ([place]) 01/23/2016   NICM (nonischemic cardiomyopathy) ([place]) 01/17/2016   NSVT (nonsustained ventricular tachycardia)    Troponin I above reference range    Mixed hyperlipidemia 05/17/2015   COPD exacerbation ([place]) 05/17/2015   Essential hypertension 03/31/2015   Elevated troponin 03/20/2015   PCP:  [name], FNP Pharmacy:   [id number]  [place] [place] - [address] [place] [address] Phone: [phone] Fax: [phone]     Social Determinants of Health (SDOH) Interventions    Readmission Risk Interventions Readmission Risk Prevention Plan 08/13/2021 05/22/2021 11/27/2020  Transportation Screening Complete Complete -  PCP or Specialist Appt within 3-5 Days Complete - -  HRI or [place] Work Consult for [place] Planning/Counseling Complete Complete -  Palliative Care Screening Not Applicable Not Applicable -  Medication Review Press photographer) Complete Complete -  PCP or Specialist appointment within 3-5 days of discharge - - Complete  HRI or [place] - - -  Some recent data might be hidden

## 2021-09-29 LAB — GLUCOSE, CAPILLARY: Glucose-Capillary: 195 mg/dL — ABNORMAL HIGH (ref 70–99)

## 2021-09-29 LAB — BASIC METABOLIC PANEL
Anion gap: 9 (ref 5–15)
BUN: 32 mg/dL — ABNORMAL HIGH (ref 6–20)
CO2: 28 mmol/L (ref 22–32)
Calcium: 9.3 mg/dL (ref 8.9–10.3)
Chloride: 98 mmol/L (ref 98–111)
Creatinine, Ser: 1.45 mg/dL — ABNORMAL HIGH (ref 0.61–1.24)
GFR, Estimated: 58 mL/min — ABNORMAL LOW (ref >60)
Glucose, Bld: 173 mg/dL — ABNORMAL HIGH (ref 70–99)
Potassium: 3.9 mmol/L (ref 3.5–5.1)
Sodium: 135 mmol/L (ref 135–145)

## 2021-09-29 MED ORDER — TORSEMIDE 60 MG PO TABS: 60.0000 mg | ORAL_TABLET | Freq: Every day | ORAL | 1 refills | Status: DC

## 2021-09-29 MED ORDER — TORSEMIDE 20 MG PO TABS: 60.0000 mg | ORAL_TABLET | Freq: Every day | ORAL | Status: DC

## 2021-09-29 NOTE — Progress Notes (Signed)
Progress Note  Patient Name: Gabriel [name]. Date of Encounter: 09/29/2021  Primary Cardiologist: Rockey Situ  Subjective   Dyspnea. Documented UOP 1.4 L for the past 24 hours, net - 6.1 L for the admission. Weight 109.1 trending to 107.8 kg over the past 24 hours. Renal function trending up. Has received IV Lasix this morning.   Inpatient Medications    Scheduled Meds:  carvedilol  6.25 mg Oral BID WC   fenofibrate  160 mg Oral Daily   insulin aspart  0-15 Units Subcutaneous TID AC & HS   insulin glargine-yfgn  22 Units Subcutaneous QHS   rivaroxaban  20 mg Oral Q supper   rosuvastatin  20 mg Oral QPM   sacubitril-valsartan  1 tablet Oral BID   Continuous Infusions:  PRN Meds: acetaminophen, albuterol, dextromethorphan-guaiFENesin, hydrALAZINE, HYDROmorphone (DILAUDID) injection, nitroGLYCERIN, ondansetron (ZOFRAN) IV   Vital Signs    Vitals:   09/29/21 0012 09/29/21 0500 09/29/21 0520 09/29/21 0724  BP: (!) 107/58  (!) 101/55 127/79  Pulse: 65  62 63  Resp: 15  20 18   Temp: 98.7 F (37.1 C)  97.6 F (36.4 C) 97.9 F (36.6 C)  TempSrc:      SpO2: 99%  95% 96%  Weight:  107.8 kg    Height:        Intake/Output Summary (Last 24 hours) at 09/29/2021 0949 Last data filed at 09/29/2021 0724 Gross per 24 hour  Intake 600 ml  Output 2100 ml  Net -1500 ml    Filed Weights   09/27/21 1341 09/28/21 1000 09/29/21 0500  Weight: 111.7 kg 109.1 kg 107.8 kg    Telemetry    Sinus rhythm with PVCs and a 12 beat run of NSVT - Personally Reviewed  ECG    No new tracings - Personally Reviewed  Physical Exam   GEN: No acute distress.   Neck: JVD difficult to assess secondary to body habitus. Cardiac: RRR, no murmurs, rubs, or gallops.  Respiratory: Improved breath sounds.  GI: Soft, nontender, non-distended.   MS: No edema; No deformity. Neuro:  Alert and oriented x 3; Nonfocal.  Psych: Normal affect.  Labs    Chemistry Recent Labs  Lab 09/27/21 [phone]  09/28/21 0538 09/28/21 0638 09/29/21 0504  NA 139 140  --  135  K 4.1 3.5  --  3.9  CL 106 102  --  98  CO2 25 28  --  28  GLUCOSE 314* 144*  --  173*  BUN 24* 27*  --  32*  CREATININE 1.04 1.07  --  1.45*  CALCIUM 9.0 9.5  --  9.3  PROT  --   --  7.1  --   ALBUMIN  --   --  3.8  --   AST  --   --  17  --   ALT  --   --  17  --   ALKPHOS  --   --  51  --   BILITOT  --   --  1.2  --   GFRNONAA >60 >60  --  58*  ANIONGAP 8 10  --  9      Hematology Recent Labs  Lab 09/27/21 0956  WBC 8.5  RBC 5.26  HGB 14.9  HCT 44.2  MCV 84.0  MCH 28.3  MCHC 33.7  RDW 14.0  PLT 147*     Cardiac EnzymesNo results for input(s): TROPONINI in the last 168 hours. No results for input(s): TROPIPOC  in the last 168 hours.   BNP Recent Labs  Lab 09/27/21 0956  BNP 1,008.2*      DDimer No results for input(s): DDIMER in the last 168 hours.   Radiology    DG Chest 2 View  Result Date: 09/27/2021 IMPRESSION: Left midlung subsegmental atelectasis. Otherwise, low lung volumes without evidence of acute cardiopulmonary disease. Electronically Signed   By: Margaretha Sheffield M.D.   On: 09/27/2021 10:49    Cardiac Studies   2D echo 05/21/2021: 1. Left ventricular ejection fraction, by estimation, is 40 to 45%. The  left ventricle has mildly decreased function. Left ventricular endocardial  border not optimally defined to evaluate regional wall motion. The left  ventricular internal cavity size  was mildly dilated. There is mild left ventricular hypertrophy. Left  ventricular diastolic parameters were normal.   2. Right ventricular systolic function is normal. The right ventricular  size is normal. Tricuspid regurgitation signal is inadequate for assessing  PA pressure.   3. Left atrial size was mild to moderately dilated.   4. Right atrial size was mildly dilated.   5. The mitral valve is normal in structure. Mild mitral valve  regurgitation. No evidence of mitral stenosis.   6. The  aortic valve is normal in structure. Aortic valve regurgitation is  mild. Mild to moderate aortic valve sclerosis/calcification is present,  without any evidence of aortic stenosis. __________  [facility] 06/13/2020: Conclusions: Severe single-vessel coronary artery disease with chronic total occlusion of the proximal RCA.  The distal vessel fills via left to right collaterals. Mild to moderate, nonobstructive CAD involving the LAD and LCx with up to 50% stenosis. Mildly elevated left and right heart filling pressures. Moderate pulmonary hypertension (mean PAP 40 mmHg) with significantly elevated pulmonary vascular resistance (PVR 7.4 WU). Severely reduced Fick cardiac output/index.   Recommendations: Continue IV diuresis for at least 1 more day. Given severely reduced cardiac output, I will decrease carvedilol to 6.5 mg twice daily.  If blood pressure tolerates and renal function is stable, consider further escalation of Entresto. Restart IV heparin 2 hours after TR band removal.  If no evidence of bleeding or vascular complication, the patient can be transitioned back to [name] tomorrow. 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  He was evaluated by Dr. Aundra Dubin in June but failed to follow-up since then. Aggressive secondary prevention of coronary artery disease.  Favor medical management of CTO of RCA, which is new since 2018. __________  2D echo 06/08/2020:  1. Left ventricular ejection fraction, by estimation, is 35 to 40%. The  left ventricle has moderately decreased function. The left ventricle  demonstrates global hypokinesis. The left ventricular internal cavity size  was moderately dilated. Left  ventricular diastolic parameters are indeterminate.   2. Right ventricular systolic function is normal. The right ventricular  size is normal. There is  mildly elevated pulmonary artery systolic  pressure. The estimated right ventricular systolic pressure is 85.8 mmHg.   3. Left atrial size was moderately dilated.   4. Mild to moderate mitral valve regurgitation. __________  Patient Profile     51 y.o. male with history of CAD with CTO of the proximal RCA, chronic chest pain with chronic troponin elevation, HFrEF secondary to NICM with frequent hospital admissions, NSVT, CKD stage III, COPD, chronic anticoagulation secondary to unprovoked PE, DM2, OSA declined CPAP, and obesity who is being seen today for the  evaluation of acute on chronic HFrEF.  Assessment & Plan    1.  Acute on chronic HFrEF secondary to nonischemic cardiomyopathy: -Volume status is significantly improved, with vigorous UOP this admission -Agree with holding IV Lasix given up trending renal function -Prior history of medication nonadherence, though he reports taking medications most recently -Start torsemide 60 mg daily 2/12 (he reports better UOP with torsemide when compared to Lasix) -Continue carvedilol and Entresto -Could consider referral to the advanced heart failure clinic as an outpatient for CardioMEMS -Relative hypotension has previously precluded escalation of GDMT including SGLT2 inhibitor -If BP allows, in the outpatient setting, would recommend retrial of SGLT2 inhibitor or addition of spironolactone -CHF education -Daily standing scale weights  2.  CAD involving the native coronary arteries with minimal high-sensitivity troponin elevation: -Minimal high-sensitivity troponin elevation not concerning for ACS -No indication for heparin drip -No plans for inpatient ischemic evaluation -PTA Xarelto in place of aspirin to minimize bleeding risk  3.  History of unprovoked recurrent PE: -PTA Xarelto  4.  HTN: -Well-controlled -Continue current therapy as outlined above  5.  Right upper quadrant/right-sided chest discomfort: -Only noticeable after  eating -LFT and lipase ok -Per primary service   6.  HLD: -PTA Crestor       For questions or updates, please contact [place] Please consult [email or web address] for contact info under Cardiology/STEMI.    Signed, Christell Faith, PA-C [facility] Pager: [phone] 09/29/2021, 9:49 AM

## 2021-09-29 NOTE — Progress Notes (Signed)
Nursing Discharge Note   Admit Date: 09/27/2021  Discharge date: 09/29/2021   Gabriel Wahlert. is to be discharged home per MD order.  AVS completed. Reviewed with patient at bedside. AVS copy provided for patient to take home.  Patient able to verbalize understanding of discharge instructions. PIV removed. Patient stable upon discharge.   Discharge Instructions     (HEART FAILURE PATIENTS) Call MD:  Anytime you have any of the following symptoms: 1) 3 pound weight gain in 24 hours or 5 pounds in 1 week 2) shortness of breath, with or without a dry hacking cough 3) swelling in the hands, feet or stomach 4) if you have to sleep on extra pillows at night in order to breathe.   Complete by: As directed    Avoid straining   Complete by: As directed    Diet - low sodium heart healthy   Complete by: As directed    Discharge instructions   Complete by: As directed    It was pleasure taking care of you. You are being started on torsemide 60 mg daily, please take it as directed starting from tomorrow. Stop taking your Lasix You can hold your metolazone until you discuss with your cardiologist Please follow-up with your cardiologist within a week for further recommendations.   Heart Failure patients record your daily weight using the same scale at the same time of day   Complete by: As directed    Increase activity slowly   Complete by: As directed    STOP any activity that causes chest pain, shortness of breath, dizziness, sweating, or exessive weakness   Complete by: As directed        Allergies as of 09/29/2021       Reactions   Metformin And Related Nausea And Vomiting   Prednisone Other (See Comments)   Reaction: Hallucinations    Zantac [ranitidine Hcl] Diarrhea, Nausea Only   Night sweats        Medication List     STOP taking these medications    ezetimibe 10 MG tablet Commonly known as: ZETIA   furosemide 40 MG tablet Commonly known as: Lasix       TAKE these  medications    Basaglar KwikPen 100 UNIT/ML Inject 28 Units into the skin at bedtime.   carvedilol 6.25 MG tablet Commonly known as: COREG Take 1 tablet (6.25 mg total) by mouth 2 (two) times daily with a meal. Needs appt for further refills   Combivent Respimat 20-100 MCG/ACT Aers respimat Generic drug: Ipratropium-Albuterol Inhale 1 puff into the lungs every 6 (six) hours as needed for wheezing.   fenofibrate 145 MG tablet Commonly known as: TRICOR Take 1 tablet (145 mg total) by mouth daily.   insulin lispro 100 UNIT/ML KwikPen Commonly known as: HUMALOG Inject 3 Units into the skin 3 (three) times daily.   metolazone 5 MG tablet Commonly known as: ZAROXOLYN TAKE 1 TABLET (5 MG TOTAL) BY MOUTH DAILY AS NEEDED (FOR WEIGHT OVER 245).   nitroGLYCERIN 0.4 MG SL tablet Commonly known as: NITROSTAT Place 1 tablet (0.4 mg total) under the tongue every 5 (five) minutes x 3 doses as needed for chest pain.   Pen Needles 32G X 4 MM Misc 200 each by Does not apply route at bedtime.   potassium chloride SA 20 MEQ tablet Commonly known as: Klor-Con M20 Take 1 tablet daily   rivaroxaban 20 MG Tabs tablet Commonly known as: XARELTO Take 1 tablet (20 mg total) by  mouth daily with supper.   rosuvastatin 20 MG tablet Commonly known as: CRESTOR Take 1 tablet (20 mg total) by mouth daily.   sacubitril-valsartan 24-26 MG Commonly known as: ENTRESTO Take 1 tablet by mouth 2 (two) times daily.   Torsemide 60 MG Tabs Take 60 mg by mouth daily. Start taking on: September 30, 5318   Trulicity 2.33 ID/5.6YS Sopn Generic drug: Dulaglutide Inject 0.75 mg into the skin once a week.        Discharge Instructions/ Education: Discharge instructions given to patient with verbalized understanding. Discharge education completed with patient including: follow up instructions, medication list, discharge activities, and limitations if indicated. Patient ambulatory to main lobby and discharged  to drive self home in private vehicle.

## 2021-09-29 NOTE — Discharge Summary (Signed)
Physician Discharge Summary   Patient: Gabriel [name]. MRN: [id number] DOB: Aug 12, 1971  Admit date:     09/27/2021  Discharge date: 09/29/21  Discharge Physician: Lorella Nimrod   PCP: [name], FNP   Recommendations at discharge:  Please check BMP in 1 week Please follow-up with your cardiologist in 1 week.  Discharge Diagnoses: Principal Problem:   Acute on chronic systolic CHF (congestive heart failure) (HCC) Active Problems:   Elevated troponin   CAD (coronary artery disease)   Essential hypertension   Mixed hyperlipidemia   CKD (chronic kidney disease), stage IIIa   History of pulmonary embolism   Type II diabetes mellitus with renal manifestations (HCC)   COPD (chronic obstructive pulmonary disease) (HCC)   Chest pain   Epistaxis   Hospital Course: Ausencio Vaden. is a 51 y.o. 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 Also endorsed some substernal chest pain, pressure-like, intermittent, nonradiating, no significant relationship with exertion. Worsening lower extremity edema, stating that Lasix does not work much for him.  Cardiology was consulted and there was some issues being noncompliant.  Multiple hospitalizations, they were suggesting adding metolazone or going to heart failure clinic for IV Lasix once a week. Patient used to take torsemide before, stating that torsemide seems working better for him.  On arrival he was hemodynamically stable.  Labs pertinent for elevated BNP at 1008.  Troponin mildly elevated at 43 with a flat curve.  Chest x-ray without any obvious abnormality and EKG with right axis deviation, poor R wave progression, ST depression in inferior leads and V3 to V6.  Admitted for acute on chronic  HFrEF and started on IV diuresis with a very good response.  Repeat echocardiogram with a EF of 40 to 45%, mild biventricular dilated cardiomyopathy.  Patient diuresed very well, more than 6 L negative, weight on the day of discharge was 237 pounds, decreased from 246 on admission.  We will stop IV Lasix after seeing an increase in creatinine, creatinine went up to 1.45.  He was started on torsemide 60 mg daily by his cardiologist and needs to have his BMP checked in 1 week.  He also need a close follow-up with cardiology for further recommendations and in order to avoid more exacerbations.  Patient will continue with the rest of his home medications and follow-up with her providers.   Consultants: Cardiology Procedures performed: None Disposition: Home Diet recommendation:  Discharge Diet Orders (From admission, onward)     Start     Ordered   09/29/21 0000  Diet - low sodium heart healthy        09/29/21 1043           Cardiac and Carb modified diet  DISCHARGE MEDICATION: Allergies as of 09/29/2021       Reactions   Metformin And Related Nausea And Vomiting   Prednisone Other (See Comments)   Reaction: Hallucinations    Zantac [ranitidine Hcl] Diarrhea, Nausea Only   Night sweats        Medication List     STOP taking these medications    ezetimibe 10 MG tablet Commonly known as: ZETIA   furosemide 40 MG tablet Commonly known as: Lasix       TAKE these medications    Basaglar KwikPen 100 UNIT/ML  Inject 28 Units into the skin at bedtime.   carvedilol 6.25 MG tablet Commonly known as: COREG Take 1 tablet (6.25 mg total) by mouth 2 (two) times daily with a meal. Needs appt for further refills   Combivent Respimat 20-100 MCG/ACT Aers respimat Generic drug: Ipratropium-Albuterol Inhale 1 puff into the lungs every 6 (six) hours as needed for wheezing.   fenofibrate 145 MG tablet Commonly known as: TRICOR Take 1 tablet (145 mg total) by mouth daily.    insulin lispro 100 UNIT/ML KwikPen Commonly known as: HUMALOG Inject 3 Units into the skin 3 (three) times daily.   metolazone 5 MG tablet Commonly known as: ZAROXOLYN TAKE 1 TABLET (5 MG TOTAL) BY MOUTH DAILY AS NEEDED (FOR WEIGHT OVER 245).   nitroGLYCERIN 0.4 MG SL tablet Commonly known as: NITROSTAT Place 1 tablet (0.4 mg total) under the tongue every 5 (five) minutes x 3 doses as needed for chest pain.   Pen Needles 32G X 4 MM Misc 200 each by Does not apply route at bedtime.   potassium chloride SA 20 MEQ tablet Commonly known as: Klor-Con M20 Take 1 tablet daily   rivaroxaban 20 MG Tabs tablet Commonly known as: XARELTO Take 1 tablet (20 mg total) by mouth daily with supper.   rosuvastatin 20 MG tablet Commonly known as: CRESTOR Take 1 tablet (20 mg total) by mouth daily.   sacubitril-valsartan 24-26 MG Commonly known as: ENTRESTO Take 1 tablet by mouth 2 (two) times daily.   Torsemide 60 MG Tabs Take 60 mg by mouth daily. Start taking on: [birth year], [birth year]   Trulicity 9.62 XB/2.8UX Sopn Generic drug: Dulaglutide Inject 0.75 mg into the skin once a week.        Follow-up Information     [name], FNP. Schedule an appointment as soon as possible for a visit in 1 week(s).   Specialty: Family Medicine Contact information: [place] [address] [phone]         Minna Merritts, MD. Schedule an appointment as soon as possible for a visit in 1 week(s).   Specialty: Cardiology Contact information: [place] [address] [phone]                 Discharge Exam: Danley Danker Weights   09/27/21 1341 09/28/21 1000 09/29/21 0500  Weight: 111.7 kg 109.1 kg 107.8 kg   General.  Well-developed gentleman, in no acute distress. Pulmonary.  Lungs clear bilaterally, normal respiratory effort. CV.  Regular rate and rhythm, no JVD, rub or murmur. Abdomen.  Soft, nontender, nondistended, BS  positive. CNS.  Alert and oriented x3.  No focal neurologic deficit. Extremities.  No edema, no cyanosis, pulses intact and symmetrical. Psychiatry.  Judgment and insight appears normal.   Condition at discharge: good  The results of significant diagnostics from this hospitalization (including imaging, microbiology, ancillary and laboratory) are listed below for reference.   Imaging Studies: DG Chest 2 View  Result Date: 09/27/2021 CLINICAL DATA:  chest pain, SHOB EXAM: CHEST - 2 VIEW COMPARISON:  08/09/2021. FINDINGS: Linear opacity left midlung, compatible with subsegmental atelectasis. No confluent consolidation. No visible pleural effusions or pneumothorax. Similar cardiomediastinal silhouette. IMPRESSION: Left midlung subsegmental atelectasis. Otherwise, low lung volumes without evidence of acute cardiopulmonary disease. Electronically Signed   By: Margaretha Sheffield M.D.   On: 09/27/2021 10:49   DG Foot Complete Left  Result Date: 09/18/2021 Please see detailed radiograph report in office note.  DG Foot  Complete Right  Result Date: 09/18/2021 Please see detailed radiograph report in office note.   Microbiology: Results for orders placed or performed during the hospital encounter of 09/27/21  Resp Panel by RT-PCR (Flu A&B, Covid) Nasopharyngeal Swab     Status: None   Collection Time: 09/27/21 11:30 AM   Specimen: Nasopharyngeal Swab; Nasopharyngeal(NP) swabs in vial transport medium  Result Value Ref Range Status   SARS Coronavirus 2 by RT PCR NEGATIVE NEGATIVE Final    Comment: (NOTE) SARS-CoV-2 target nucleic acids are NOT DETECTED.  The SARS-CoV-2 RNA is generally detectable in upper respiratory specimens during the acute phase of infection. The lowest concentration of SARS-CoV-2 viral copies this assay can detect is 138 copies/mL. A negative result does not preclude SARS-Cov-2 infection and should not be used as the sole basis for treatment or other patient management  decisions. A negative result may occur with  improper specimen collection/handling, submission of specimen other than nasopharyngeal swab, presence of viral mutation(s) within the areas targeted by this assay, and inadequate number of viral copies(<138 copies/mL). A negative result must be combined with clinical observations, patient history, and epidemiological information. The expected result is Negative.  Fact Sheet for Patients:  EntrepreneurPulse.com.au  Fact Sheet for Healthcare Providers:  IncredibleEmployment.be  This test is no t yet approved or cleared by the [place] FDA and  has been authorized for detection and/or diagnosis of SARS-CoV-2 by FDA under an Emergency Use Authorization (EUA). This EUA will remain  in effect (meaning this test can be used) for the duration of the COVID-19 declaration under Section 564(b)(1) of the Act, 21 U.S.C.section 360bbb-3(b)(1), unless the authorization is terminated  or revoked sooner.       Influenza A by PCR NEGATIVE NEGATIVE Final   Influenza B by PCR NEGATIVE NEGATIVE Final    Comment: (NOTE) The Xpert Xpress SARS-CoV-2/FLU/RSV plus assay is intended as an aid in the diagnosis of influenza from Nasopharyngeal swab specimens and should not be used as a sole basis for treatment. Nasal washings and aspirates are unacceptable for Xpert Xpress SARS-CoV-2/FLU/RSV testing.  Fact Sheet for Patients: EntrepreneurPulse.com.au  Fact Sheet for Healthcare Providers: IncredibleEmployment.be  This test is not yet approved or cleared by the [place] FDA and has been authorized for detection and/or diagnosis of SARS-CoV-2 by FDA under an Emergency Use Authorization (EUA). This EUA will remain in effect (meaning this test can be used) for the duration of the COVID-19 declaration under Section 564(b)(1) of the Act, 21 U.S.C. section 360bbb-3(b)(1), unless the  authorization is terminated or revoked.  Performed at [facility], [address] [place], [place] [address]     Labs: CBC: Recent Labs  Lab 09/27/21 0956  WBC 8.5  HGB 14.9  HCT 44.2  MCV 84.0  PLT 219*   Basic Metabolic Panel: Recent Labs  Lab 09/27/21 0956 09/27/21 1130 09/28/21 0538 09/29/21 0504  NA 139  --  140 135  K 4.1  --  3.5 3.9  CL 106  --  102 98  CO2 25  --  28 28  GLUCOSE 314*  --  144* 173*  BUN 24*  --  27* 32*  CREATININE 1.04  --  1.07 1.45*  CALCIUM 9.0  --  9.5 9.3  MG  --  1.8 1.9  --    Liver Function Tests: Recent Labs  Lab 09/28/21 0638  AST 17  ALT 17  ALKPHOS 51  BILITOT 1.2  PROT 7.1  ALBUMIN 3.8  CBG: Recent Labs  Lab 09/28/21 0840 09/28/21 1246 09/28/21 1640 09/28/21 2048 09/29/21 0727  GLUCAP 252* 198* 176* 305* 195*    Discharge time spent: greater than 30 minutes.  Signed: Lorella Nimrod, MD [facility] 09/29/2021

## 2021-10-01 ENCOUNTER — Other Ambulatory Visit: Payer: Self-pay | Admitting: Family Medicine

## 2021-10-01 DIAGNOSIS — Z09 Encounter for follow-up examination after completed treatment for conditions other than malignant neoplasm: Secondary | ICD-10-CM

## 2021-10-02 ENCOUNTER — Telehealth: Payer: Self-pay | Admitting: *Deleted

## 2021-10-02 ENCOUNTER — Other Ambulatory Visit: Payer: Self-pay | Admitting: Family Medicine

## 2021-10-02 ENCOUNTER — Ambulatory Visit: Payer: BC Managed Care – PPO

## 2021-10-02 DIAGNOSIS — I5023 Acute on chronic systolic (congestive) heart failure: Secondary | ICD-10-CM

## 2021-10-02 DIAGNOSIS — E1165 Type 2 diabetes mellitus with hyperglycemia: Secondary | ICD-10-CM

## 2021-10-02 MED ORDER — FREESTYLE LIBRE 2 SENSOR MISC: 11 refills | Status: DC

## 2021-10-02 MED ORDER — FREESTYLE LIBRE 2 READER DEVI: 0 refills | Status: DC

## 2021-10-02 MED ORDER — TRULICITY 1.5 MG/0.5ML ~~LOC~~ SOAJ: 1.5000 mg | SUBCUTANEOUS | 3 refills | Status: DC

## 2021-10-02 NOTE — Progress Notes (Signed)
Chronic Care Management Pharmacy Note  10/02/2021 Name:  Gabriel [name]. MRN:  [id number] DOB:  1971-08-07  Summary: Patient presents for initial CCM consult. He denies affordability or other barriers to getting and taking his medications. His primary concerns are getting his blood sugars down and finding a way to check his blood sugars without sticking his fingers.    Explained to patient that with commercial insurance, he can expect to pay up to $75 for the freestyle libre reader, and up to $65/month for the freestyle libre sensors. Patient acknowledged price and thought it would be affordable for him.   Recommendations/Changes made from today's visit: Spoke with Tally Joe, FNP and was amenable to increasing trulicity to 1.5 mg weekly.  New Rx for Freestyle Libre 2 Meter + sensors sent into patient pharmacy.  Plan: CPP follow-up in 2 weeks for CGM teaching.    Subjective: Gabriel [name]. is an 51 y.o. year old male who is a primary patient of [name], FNP.  The CCM team was consulted for assistance with disease management and care coordination needs.    Engaged with patient by telephone for initial visit in response to provider referral for pharmacy case management and/or care coordination services.   Consent to Services:  Patient is not eligible for CCM services. Pharmacy consulted to assist with medication barriers and with helping patient start on CGM.  Patient Care Team: [name], FNP as PCP - General (Family Medicine) Rockey Situ Kathlene November, MD as PCP - Cardiology (Cardiology) Alisa Graff, FNP as Nurse Practitioner (Family Medicine) Rockey Situ Kathlene November, MD as Consulting Physician (Cardiology) Germaine Pomfret, [facility] (Pharmacist)  Objective:  Lab Results  Component Value Date   CREATININE 1.45 (H) 09/29/2021   BUN 32 (H) 09/29/2021   GFRNONAA 58 (L) 09/29/2021   GFRAA >60 05/14/2020   NA 135 09/29/2021   K 3.9 09/29/2021   CALCIUM 9.3 09/29/2021    CO2 28 09/29/2021   GLUCOSE 173 (H) 09/29/2021    Lab Results  Component Value Date/Time   HGBA1C 8.2 (H) 09/27/2021 01:54 PM   HGBA1C 8.1 (A) 09/05/2021 10:22 AM   HGBA1C 8.1 (H) 03/28/2021 11:26 AM    Last diabetic Eye exam: No results found for: HMDIABEYEEXA  Last diabetic Foot exam: No results found for: HMDIABFOOTEX   Lab Results  Component Value Date   CHOL 114 09/28/2021   HDL 24 (L) 09/28/2021   LDLCALC 52 09/28/2021   LDLDIRECT 60.6 11/23/2020   TRIG 190 (H) 09/28/2021   CHOLHDL 4.8 09/28/2021    Hepatic Function Latest Ref Rng & Units 09/28/2021 03/29/2021 03/28/2021  Total Protein 6.5 - 8.1 g/dL 7.1 6.1(L) 6.3(L)  Albumin 3.5 - 5.0 g/dL 3.8 3.2(L) 3.3(L)  AST 15 - 41 U/L 17 14(L) 27  ALT 0 - 44 U/L _0 Alk Phosphatase 38 - 126 U/L 51 56 59  Total Bilirubin 0.3 - 1.2 mg/dL 1.2 1.4(H) 2.1(H)  Bilirubin, Direct 0.0 - 0.2 mg/dL 0.2 0.2 0.4(H)    Lab Results  Component Value Date/Time   TSH 1.211 11/23/2020 07:04 PM   TSH 1.740 09/09/2018 10:31 AM   TSH 2.456 10/01/2017 01:12 AM    CBC Latest Ref Rng & Units 09/27/2021 08/13/2021 08/12/2021  WBC 4.0 - 10.5 K/uL 8.5 10.9(H) 10.8(H)  Hemoglobin 13.0 - 17.0 g/dL 14.9 18.1(H) 17.1(H)  Hematocrit 39.0 - 52.0 % 44.2 53.6(H) 50.4  Platelets 150 - 400 K/uL 147(L) 183 200  No results found for: VD25OH  Clinical ASCVD: Yes  The ASCVD Risk score (Arnett DK, et al., 2019) failed to calculate for the following reasons:   The patient has a prior MI or stroke diagnosis    Depression screen [facility] 2/9 08/28/2021 01/17/2021 06/23/2020  Decreased Interest 0 2 0  Down, Depressed, Hopeless 0 0 0  PHQ - 2 Score 0 2 0  Altered sleeping - 2 0  Tired, decreased energy - 3 1  Change in appetite - 2 0  Feeling bad or failure about yourself  - 0 0  Trouble concentrating - 1 0  Moving slowly or fidgety/restless - 0 0  Suicidal thoughts - 0 0  PHQ-9 Score - 10 1  Difficult doing work/chores - Somewhat difficult Not difficult  at all  Some recent data might be hidden    Social History   Tobacco Use  Smoking Status Former   Packs/day: 0.50   Years: 33.00   Pack years: 16.50   Types: Cigarettes   Quit date: 05/08/2020   Years since quitting: 1.4  Smokeless Tobacco Former   Quit date: 05/08/2020  Tobacco Comments   Quit Sept 2021   BP Readings from Last 3 Encounters:  09/29/21 127/79  09/05/21 113/69  08/28/21 (!) 167/94   Pulse Readings from Last 3 Encounters:  09/29/21 63  09/05/21 75  08/28/21 88   Wt Readings from Last 3 Encounters:  09/29/21 237 lb 9.6 oz (107.8 kg)  09/05/21 243 lb 4.8 oz (110.4 kg)  08/28/21 251 lb 2 oz (113.9 kg)   BMI Readings from Last 3 Encounters:  09/29/21 32.22 kg/m  09/05/21 33.00 kg/m  08/28/21 34.06 kg/m    Assessment/Interventions: Review of patient past medical history, allergies, medications, health status, including review of consultants reports, laboratory and other test data, was performed as part of comprehensive evaluation and provision of chronic care management services.   SDOH:  (Social Determinants of Health) assessments and interventions performed: Yes  SDOH Screenings   Alcohol Screen: Not on file  Depression (PHQ2-9): Low Risk    PHQ-2 Score: 0  Financial Resource Strain: Not on file  Food Insecurity: Not on file  Housing: Not on file  Physical Activity: Not on file  Social Connections: Not on file  Stress: Not on file  Tobacco Use: Medium Risk   Smoking Tobacco Use: Former   Smokeless Tobacco Use: Former   Passive Exposure: Not on Pensions consultant Needs: Not on file    Brady  Allergies  Allergen Reactions   Metformin And Related Nausea And Vomiting   Prednisone Other (See Comments)    Reaction: Hallucinations     Zantac [Ranitidine Hcl] Diarrhea and Nausea Only    Night sweats    Medications Reviewed Today     Reviewed by Loann Quill, CPhT (Pharmacy Technician) on 09/27/21 at 1318  Med List Status:  Complete   Medication Order Taking? Sig Documenting Provider Last Dose Status Informant  carvedilol (COREG) 6.25 MG tablet [id number] Yes Take 1 tablet (6.25 mg total) by mouth 2 (two) times daily with a meal. Needs appt for further refills Larey Dresser, MD 09/27/2021 Active Self  Dulaglutide (TRULICITY) 7.67 MC/9.4BS SOPN [id number] No Inject 0.75 mg into the skin once a week. [name], FNP 09/25/2021 Active Self  ezetimibe (ZETIA) 10 MG tablet [id number] No Take 1 tablet (10 mg total) by mouth daily.  Patient not taking: Reported on 09/27/2021   Minna Merritts,  MD Not Taking Active Self  fenofibrate (TRICOR) 145 MG tablet [id number] Yes Take 1 tablet (145 mg total) by mouth daily. [name], FNP 09/26/2021 Active Self  furosemide (LASIX) 40 MG tablet [id number] Yes Take 1.5 tablets (60 mg total) by mouth daily. Tally Joe T, FNP 09/27/2021 Active Self  Insulin Glargine (BASAGLAR KWIKPEN) 100 UNIT/ML [id number] Yes Inject 28 Units into the skin at bedtime. [name], FNP 09/26/2021 Active Self  insulin lispro (HUMALOG) 100 UNIT/ML KwikPen [id number] Yes Inject 3 Units into the skin 3 (three) times daily. [name], MD 09/27/2021 Active Self  Insulin Pen Needle (PEN NEEDLES) 32G X 4 MM MISC [id number]  200 each by Does not apply route at bedtime. [name], FNP  Active Self  Ipratropium-Albuterol (COMBIVENT RESPIMAT) 20-100 MCG/ACT AERS respimat [id number] Yes Inhale 1 puff into the lungs every 6 (six) hours as needed for wheezing. [provider] Past Week Active Self  metolazone (ZAROXOLYN) 5 MG tablet [id number] Yes TAKE 1 TABLET (5 MG TOTAL) BY MOUTH DAILY AS NEEDED (FOR WEIGHT OVER 245). [name], FNP Past Week Active Self  nitroGLYCERIN (NITROSTAT) 0.4 MG SL tablet [id number] No Place 1 tablet (0.4 mg total) under the tongue every 5 (five) minutes x 3 doses as needed for chest pain. Lorella Nimrod, MD prn prn Active Self  potassium chloride SA (KLOR-CON M20) 20 MEQ tablet  [id number] Yes Take 1 tablet daily Alisa Graff, [place] 09/27/2021 Active Self  rivaroxaban (XARELTO) 20 MG TABS tablet [id number] Yes Take 1 tablet (20 mg total) by mouth daily with supper. [name], MD 09/26/2021 Active Self  rosuvastatin (CRESTOR) 20 MG tablet [id number] Yes Take 1 tablet (20 mg total) by mouth daily. [name], FNP 09/26/2021 Active Self  sacubitril-valsartan (ENTRESTO) 24-26 MG [id number] Yes Take 1 tablet by mouth 2 (two) times daily. [name], FNP 09/27/2021 Active Self            Patient Active Problem List   Diagnosis Date Noted   Epistaxis 09/27/2021   CAD (coronary artery disease) 09/27/2021   Right foot pain 09/05/2021   Arch pain of right foot 09/05/2021   Elevated triglycerides with high cholesterol 09/05/2021   Memory loss or impairment 09/05/2021   Hyperlipidemia associated with type 2 diabetes mellitus ([place]) 09/05/2021   Hospital discharge follow-up 09/05/2021   Dyspnea 08/10/2021   Acute on chronic systolic (congestive) heart failure ([place]) 08/09/2021   Type II or unspecified type diabetes mellitus without mention of complication, not stated as uncontrolled 05/21/2021   Acute on chronic diastolic CHF (congestive heart failure) ([place]) 05/21/2021   Long term current use of anticoagulant 05/20/2021   Mitral regurgitation 05/20/2021   Mediastinal lymphadenopathy 05/20/2021   Atherosclerotic heart disease of native coronary artery with other forms of angina pectoris ([place])    Acalculous cholecystitis 03/29/2021   Right upper quadrant abdominal pain 03/28/2021   AKI (acute kidney injury) ([place]) 11/24/2020   Acute kidney injury ([place]) 11/23/2020   OSA on CPAP 11/23/2020   Chest pain 10/27/2020   Snoring 08/15/2020   Type II diabetes mellitus with renal manifestations ([place]) 06/11/2020   COPD (chronic obstructive pulmonary disease) ([place]) 06/11/2020   Leukocytosis 06/11/2020   Hyperglycemia    Elevated brain natriuretic peptide (BNP) level    Uncontrolled  type 2 diabetes mellitus with hyperglycemia, with long-term current use of insulin ([place]) 05/13/2020   Acute on chronic HFrEF (heart failure with reduced ejection fraction) ([place]) 05/12/2020   Erectile dysfunction  due to arterial insufficiency 03/15/2020   Screening PSA (prostate specific antigen) 03/15/2020   Erectile dysfunction 03/02/2020   History of pulmonary embolism    Hemoptysis    Costochondritis    Acute on chronic combined systolic (congestive) and diastolic (congestive) heart failure ([place]) 12/20/2019   Acute on chronic heart failure ([place]) 11/22/2019   CKD (chronic kidney disease), stage IIIa 06/26/2019   Type 2 diabetes mellitus with hyperlipidemia ([place]) 06/26/2019   Hypertensive urgency 06/26/2019   Acute on chronic combined systolic and diastolic CHF (congestive heart failure) ([place]) 09/02/2017   Other chest pain 08/24/2017   Dizziness    Noncompliance with medications 04/29/2017   Back pain 03/06/2017   Tobacco use 12/04/2016   Gallbladder sludge 12/04/2016   Acute on chronic systolic CHF (congestive heart failure) ([place]) 11/28/2016   Coronary artery disease, non-occlusive 03/15/2016   Atypical chest pain 02/16/2016   Orthostatic hypotension 01/23/2016   Hypomagnesemia 80/99/8338   Chronic systolic CHF (congestive heart failure) ([place]) 01/23/2016   NICM (nonischemic cardiomyopathy) ([place]) 01/17/2016   NSVT (nonsustained ventricular tachycardia)    Troponin I above reference range    Mixed hyperlipidemia 05/17/2015   COPD exacerbation ([place]) 05/17/2015   Essential hypertension 03/31/2015   Elevated troponin 03/20/2015    Immunization History  Administered Date(s) Administered   Hep A / Hep B 03/26/2012   Influenza,inj,Quad PF,6+ Mos 05/19/2015, 08/22/2017, 06/27/2019, 06/23/2020, 05/23/2021   Pneumococcal Polysaccharide-23 01/06/2016    Conditions to be addressed/monitored:  Diabetes  There are no care plans that you recently modified to display for this patient.     Medication Assistance: None required.  Patient affirms current coverage meets needs.  Patient's preferred pharmacy is:  [id number] [place] [place] [id number]: [phone]: [phone] Follow Up Patient Decision:  Patient requests no follow-up at this time.  Plan:  Patient is not eligible for CCM services. Pharmacy consulted to assist with medication barriers and with helping patient start on CGM.  AJunius Argyle PharmD, BPara March CPP  Clinical Pharmacist Practitioner  [phone]

## 2021-10-02 NOTE — Telephone Encounter (Signed)
-----   Message from Rise Mu, PA-C sent at 09/29/2021  9:59 AM EST ----- Please schedule patient for a BMET in 1 week. Dx: HFrEF

## 2021-10-02 NOTE — Telephone Encounter (Signed)
Left voicemail message to call back so we can schedule BMET to be done next week here in our office. Order has been entered.

## 2021-10-04 NOTE — Telephone Encounter (Signed)
Spoke with patient and reviewed request for repeat labs next week and he requested to be done on Friday 2/24 due to his schedule. Scheduled for him to come in for those labs on that day and he confirmed with no further questions at this time.

## 2021-10-08 ENCOUNTER — Other Ambulatory Visit: Payer: Self-pay

## 2021-10-08 DIAGNOSIS — E1165 Type 2 diabetes mellitus with hyperglycemia: Secondary | ICD-10-CM

## 2021-10-08 MED ORDER — TRULICITY 1.5 MG/0.5ML ~~LOC~~ SOAJ: 1.5000 mg | SUBCUTANEOUS | 3 refills | Status: DC

## 2021-10-12 ENCOUNTER — Other Ambulatory Visit: Payer: Self-pay

## 2021-10-12 ENCOUNTER — Other Ambulatory Visit (INDEPENDENT_AMBULATORY_CARE_PROVIDER_SITE_OTHER): Payer: BC Managed Care – PPO

## 2021-10-12 DIAGNOSIS — I5023 Acute on chronic systolic (congestive) heart failure: Secondary | ICD-10-CM | POA: Diagnosis not present

## 2021-10-13 LAB — BASIC METABOLIC PANEL
BUN/Creatinine Ratio: 15 (ref 9–20)
BUN: 20 mg/dL (ref 6–24)
CO2: 23 mmol/L (ref 20–29)
Calcium: 9.5 mg/dL (ref 8.7–10.2)
Chloride: 103 mmol/L (ref 96–106)
Creatinine, Ser: 1.32 mg/dL — ABNORMAL HIGH (ref 0.76–1.27)
Glucose: 268 mg/dL — ABNORMAL HIGH (ref 70–99)
Potassium: 4.5 mmol/L (ref 3.5–5.2)
Sodium: 144 mmol/L (ref 134–144)
eGFR: 65 mL/min/{1.73_m2} (ref >59)

## 2021-10-15 ENCOUNTER — Telehealth: Payer: Self-pay

## 2021-10-15 ENCOUNTER — Ambulatory Visit: Payer: Medicaid Other | Admitting: Family

## 2021-10-15 NOTE — Progress Notes (Signed)
Per Clinical pharmacist, please reach to patient and reschedule his appointment that was on 10/16/2021.  Office follow up appointment reschedule with Care management team member scheduled for : 11/09/2021 at 8:30 am.   Patient states he is feeling under the weather.  Patient states [facility] not [facility] to him regarding freestyle libre reader, and ask If I could reach out to [facility] to confirm if they have the prescription and the price.I was on hold with [facility] for 25 minutes.   Per [facility], they did receive the prescriptions for [facility] reader and the sensors, but patient insurance will not cover it.Freestyle Libre reader would be around $65.00 , and the [employer] would ranging around $80.00 monthly with there coupon.  Inform patient of the above, and patient verbalized understanding and  states he is going to reach out to his insurance to gather information.  [place] Pharmacist Assistant [phone]

## 2021-10-15 NOTE — Telephone Encounter (Signed)
Copied from [place] [phone]. Topic: General - Other >> Oct 15, 2021 10:37 AM Bayard Beaver wrote: Reason for CRM:pt needs to reschedule 02/28 pharmacist appt. Please call back

## 2021-10-16 ENCOUNTER — Ambulatory Visit: Payer: BC Managed Care – PPO

## 2021-10-16 ENCOUNTER — Ambulatory Visit: Payer: BC Managed Care – PPO | Admitting: Podiatry

## 2021-10-16 ENCOUNTER — Encounter: Payer: BC Managed Care – PPO | Admitting: Family Medicine

## 2021-10-18 ENCOUNTER — Other Ambulatory Visit (HOSPITAL_COMMUNITY): Payer: Self-pay | Admitting: Cardiology

## 2021-10-25 NOTE — Progress Notes (Signed)
Patient ID: Gabriel Brod., male    DOB: 04-10-1971, 51 y.o.   MRN: [id number]  HPI  Gabriel [name] is a 51 y/o male with a history of HTN, diabetes, COPD, CKD, asthma, previous tobacco use and chronic heart failure.   Echo results from 05/21/21 reviewed and showed an EF of 40-45% along with mild LVH and mild Gabriel. Echo report from 05/21/21 reviewed and showed an EF of 40-45% along with mild LVH and mild Gabriel. Echo report from 06/08/20 reviewed and showed an EF of 35-40% along with mildly elevated PA pressure of 44.7 mmHg, moderate LAE and mild/moderate Gabriel. Echo report from 11/23/19 reviewed and showed an EF of 25-30% with mild LVH and moderately elevated PA Pressure. Echo report from 06/26/2019 reviewed and showed an EF of 30-35% along with moderately elevated PA pressure. Echo done 04/29/17 showed EF 45-50%. Echo report that was done 11/28/16 and it showed an EF of 40-45% along with mild Gabriel. EF has improved from 30-35% July 2017.  RHC/LHC done 06/13/20 and showed: Severe single-vessel coronary artery disease with chronic total occlusion of the proximal RCA.  The distal vessel fills via left to right collaterals. Mild to moderate, nonobstructive CAD involving the LAD and LCx with up to 50% stenosis. Mildly elevated left and right heart filling pressures. Moderate pulmonary hypertension (mean PAP 40 mmHg) with significantly elevated pulmonary vascular resistance (PVR 7.4 WU). Severely reduced Fick cardiac output/index.  Admitted 09/27/21 due to worsening SOB due to HF exacerbation. Initially given IV lasix with transition to oral diuretics with resultant loss of 6L. Cardiology consult obtained. Discharged after 2 days. Admitted 08/09/21 due to acute on chronic heart failure. Initially given IV lasix with transition to oral diuretics. Entresto held due to AKI but then resumed prior to discharge. Discharged after 4 days. Admitted 05/20/21 due to chest pain and shortness of breath. BNP elevated so IV lasix given.  Cardiology consult obtained. Entresto and IV lasix had to be held due to rising creatinine. Chronically elevated troponins. Discharged after 3 days.   He presents today for a follow-up visit with a chief complaint of moderate shortness of breath with minimal exertion. He describes this as chronic in nature having been present for several years although has worsened over the last 3-4 days. He has associated fatigue, cough, stable chest pain, abdominal distention, difficulty sleeping and slight weight gain along with this. He denies any dizziness, palpitations, pedal edema or wheezing.   Has been out of entresto for the last 3 days. Has taken metolazone twice this week due to worsening symptoms.   Past Medical History:  Diagnosis Date   CAD (coronary artery disease)    a. 04/2015 low risk MV;  b. 12/2016 Cath: minor irregs in LAD/Diag/LCX/OM, RCA 40p/m/d; c. 05/2020 Cath: LM nl, LAD 50d, LCX 30p, OM1 40, RCA 100p w/ L->L collats. CO/CI 3.1/1.3-->Med rx.   Chest wall pain, chronic    CHF (congestive heart failure) (HCC)    Chronic Troponin Elevation    CKD (chronic kidney disease), stage II-III    COPD (chronic obstructive pulmonary disease) (HCC)    Diabetes mellitus without complication (HCC)    HFrEF (heart failure with reduced ejection fraction) ([place])    Hypertension    Mitral regurgitation    Mild to moderate by October 2021 echocardiogram.   Mixed Ischemic & NICM (nonischemic cardiomyopathy) ([place])    a. 03/2015 Echo: EF 45-50%; b. 12/2015 Echo: EF 20-25%; c. 02/2016 Echo: EF 30-35%; d. 11/2016  Echo: EF 40-45%; e. 06/2019 Echo: EF 30-35%; f. 11/2019 Echo: EF 25-30%; g. 05/2020 Echo: EF 35-40%, glob HK, nl RV size/fxn, PASP 44.20mHg, mod dil LA. Mild to mod Gabriel.   Myocardial infarct (HCC)    NSVT (nonsustained ventricular tachycardia)    a. 12/2015 noted on tele-->amio;  b. 12/2015 Event monitor: no VT noted.   Obesity (BMI 30.0-34.9)    Psoriasis    [place]oli [place] 06/07[place]2020    06/07/20: small bilateral PEs.  12/31/19: RUL and RLL PEs.   Syncope    a. 01/2016 - felt to be vasovagal.   Past Surgical History:  Procedure Laterality Date   AMPUTATION     CARDIAC CATHETERIZATION     FINGER AMPUTATION     Traumatic   FINGER FRACTURE SURGERY Left    LEFT HEART CATH AND CORONARY ANGIOGRAPHY N/A 01/06/2017   Procedure: Left Heart Cath and Coronary Angiography;  Surgeon: AWellington Hampshire MD;  [facility];  Service: Cardiovascular;  Laterality: N/A;   RIGHT/LEFT HEART CATH AND CORONARY ANGIOGRAPHY N/A 06/13/2020   Procedure: RIGHT/LEFT HEART CATH AND CORONARY ANGIOGRAPHY;  Surgeon: ENelva Bush MD;  [facility];  Service: Cardiovascular;  Laterality: N/A;   Family History  Problem Relation Age of Onset   Diabetes Mother    Diabetes Mellitus II Mother    Hypothyroidism Mother    Hypertension Mother    Kidney failure Mother        Dialysis   Heart attack Mother        476yo approximately   Hypertension Father    Gout Father    Cancer Maternal Grandfather    Diabetes Maternal Grandfather    Cancer Paternal Aunt    Social History   Tobacco Use   Smoking status: Former    Packs/day: 0.50    Years: 33.00    Pack years: 16.50    Types: Cigarettes    Quit date: 05/08/2020    Years since quitting: 1.4   Smokeless tobacco: Former    Quit date: 05/08/2020   Tobacco comments:    Quit Sept 2021  Substance Use Topics   Alcohol use: Yes    Alcohol/week: 0.0 standard drinks    Comment: occassionally   Allergies  Allergen Reactions   Metformin And Related Nausea And Vomiting   Prednisone Other (See Comments)    Reaction: Hallucinations     Zantac [Ranitidine Hcl] Diarrhea and Nausea Only    Night sweats    Prior to Admission medications   Medication Sig Start Date End Date Taking? Authorizing Provider  carvedilol (COREG) 6.25 MG tablet Take 1 tablet (6.25 mg total) by mouth 2 (two) times daily with a meal. NEED  FOLLOW UP APPOINTMENT FOR ANYMORE REFILLS 10/18/21  Yes MLarey Dresser MD  Continuous Blood Gluc Receiver (FREESTYLE LIBRE 2 READER) DEVI Use to monitor blood sugars as directed 10/02/21  Yes PGwyneth Sprout FNP  Continuous Blood Gluc Sensor (FREESTYLE LIBRE 2 SENSOR) MISC Use to monitor blood sugars as directed 10/02/21  Yes PTally JoeT, FNP  Dulaglutide (TRULICITY) 1.5 MPJ/0.9TOSOPN Inject 1.5 mg into the skin once a week. 10/08/21  Yes PTally JoeT, FNP  ezetimibe (ZETIA) 10 MG tablet Take 10 mg by mouth daily.   Yes [provider]  fenofibrate (TRICOR) 145 MG tablet Take 1 tablet (145 mg total) by mouth daily. 09/05/21  Yes PGwyneth Sprout FNP  Insulin Glargine ([facility] 100  UNIT/ML Inject 28 Units into the skin at bedtime. 09/05/21 09/26/22 Yes [name], FNP  insulin lispro (HUMALOG) 100 UNIT/ML KwikPen Inject 3 Units into the skin 3 (three) times daily. 08/13/21 11/11/21 Yes [name], MD  Insulin Pen Needle (PEN NEEDLES) 32G X 4 MM MISC 200 each by Does not apply route at bedtime. 09/05/21  Yes Tally Joe T, FNP  Ipratropium-Albuterol (COMBIVENT RESPIMAT) 20-100 MCG/ACT AERS respimat Inhale 1 puff into the lungs every 6 (six) hours as needed for wheezing.   Yes [provider]  metolazone (ZAROXOLYN) 5 MG tablet TAKE 1 TABLET (5 MG TOTAL) BY MOUTH DAILY AS NEEDED (FOR WEIGHT OVER 245). 09/05/21  Yes [name], FNP  nitroGLYCERIN (NITROSTAT) 0.4 MG SL tablet Place 1 tablet (0.4 mg total) under the tongue every 5 (five) minutes x 3 doses as needed for chest pain. 06/09/20  Yes Lorella Nimrod, MD  potassium chloride SA (KLOR-CON M20) 20 MEQ tablet Take 1 tablet daily 05/29/21  Yes Volanda Mangine, Aura Fey, FNP  rivaroxaban (XARELTO) 20 MG TABS tablet Take 1 tablet (20 mg total) by mouth daily with supper. 08/13/21 11/11/21 Yes [name], MD  rosuvastatin (CRESTOR) 20 MG tablet Take 1 tablet (20 mg total) by mouth daily. 09/05/21  Yes [name], FNP  torsemide 60 MG  TABS Take 60 mg by mouth daily. 09/30/21  Yes Lorella Nimrod, MD  sacubitril-valsartan (ENTRESTO) 24-26 MG Take 1 tablet by mouth 2 (two) times daily. Patient not taking: Reported on 10/26/2021 09/05/21   Tally Joe T, FNP    Review of Systems  Constitutional:  Positive for fatigue (easily). Negative for appetite change.  HENT:  Positive for postnasal drip. Negative for congestion and sore throat.   Eyes: Negative.   Respiratory:  Positive for cough and shortness of breath (worsening). Negative for chest tightness and wheezing.   Cardiovascular:  Positive for chest pain (intermittent). Negative for palpitations and leg swelling.  Gastrointestinal:  Positive for abdominal distention. Negative for abdominal pain.  Endocrine: Negative.   Genitourinary: Negative.   Musculoskeletal:  Positive for arthralgias (right foot).  Skin: Negative.   Allergic/Immunologic: Negative.   Neurological:  Negative for dizziness and light-headedness.  Hematological:  Negative for adenopathy. Does not bruise/bleed easily.  Psychiatric/Behavioral:  Positive for sleep disturbance (sleeping on 1 pillow). Negative for dysphoric mood. The patient is not nervous/anxious.    Vitals:   10/26/21 0915  BP: (!) 164/91  Pulse: 82  Resp: 14  SpO2: 99%  Weight: 254 lb 5 oz (115.4 kg)  Height: 6' (1.829 m)   Wt Readings from Last 3 Encounters:  10/26/21 254 lb 5 oz (115.4 kg)  09/29/21 237 lb 9.6 oz (107.8 kg)  09/05/21 243 lb 4.8 oz (110.4 kg)   Lab Results  Component Value Date   CREATININE 1.32 (H) 10/12/2021   CREATININE 1.45 (H) 09/29/2021   CREATININE 1.07 09/28/2021   Physical Exam Vitals and nursing note reviewed.  Constitutional:      Appearance: He is well-developed.  HENT:     Head: Normocephalic and atraumatic.  Neck:     Vascular: No JVD.  Cardiovascular:     Rate and Rhythm: Normal rate and regular rhythm.  Pulmonary:     Effort: Pulmonary effort is normal.     Breath sounds: No wheezing or  rales.  Chest:     Chest wall: No tenderness.  Abdominal:     General: There is distension.     Tenderness: There is  no abdominal tenderness.  Musculoskeletal:        General: No tenderness.     Cervical back: Normal range of motion and neck supple.     Right lower leg: Edema (trace pitting) present.     Left lower leg: Edema (trace pitting) present.  Skin:    General: Skin is warm and dry.  Neurological:     Mental Status: He is alert and oriented to person, place, and time.  Psychiatric:        Behavior: Behavior normal.        Thought Content: Thought content normal.   Assessment & Plan:  1: Acute on Chronic heart failure with reduced ejection fraction- - NYHA class III - moderately fluid overloaded today with weight gain, abdominal distention and worsening SOB - weighing daily and reminded to call for an overnight weight gain of >2 pounds or a weekly weight gain of >5 pounds; weight at home has been rising.  - weight up 3 pounds since last visit here 2 months ago but up 17 pounds from hospital d/c on 09/29/21 - will send for '80mg'$  IV lasix/ 35mq PO potassium this afternoon as he has another appointment to get to in ~ an hour - will check BMP/BNP as well - not adding salt and he was encouraged to closely follow a '2000mg'$  sodium diet - saw cardiology (Kathlen Mody 06/18/21 - on GDMT of jardiance, carvedilol & entresto - 1 month samples of entresto started and new RX sent to his pharmacy - saw EP (Caryl Comes 09/02/19; discussion about ICD  - [facility] - BNP from 09/27/21 was 1008.2  2: HTN- - BP elevated but he's been out of entresto for a few days and has fluid on board - entresto resumed per above and IV lasix being given later today - saw PCP (Rollene Rotunda 09/05/21 - BMP from 10/12/21 reviewed and showed sodium 144, potassium 4.5, creatinine 1.32 and GFR 65  3: Diabetes-   -  A1c 8.2% on 09/27/21 - completed some classes at the [facility]   Medication  bottles reviewed.   Return in 3 days

## 2021-10-26 ENCOUNTER — Encounter: Payer: Self-pay | Admitting: Family

## 2021-10-26 ENCOUNTER — Ambulatory Visit (HOSPITAL_BASED_OUTPATIENT_CLINIC_OR_DEPARTMENT_OTHER): Payer: BC Managed Care – PPO | Admitting: Family

## 2021-10-26 ENCOUNTER — Ambulatory Visit
Admission: RE | Admit: 2021-10-26 | Discharge: 2021-10-26 | Disposition: A | Discharge status: Home / Self Care | Payer: BC Managed Care – PPO | Source: Ambulatory Visit | Attending: Family | Admitting: Family

## 2021-10-26 ENCOUNTER — Ambulatory Visit (INDEPENDENT_AMBULATORY_CARE_PROVIDER_SITE_OTHER): Payer: BC Managed Care – PPO | Admitting: Podiatry

## 2021-10-26 ENCOUNTER — Other Ambulatory Visit: Payer: Self-pay

## 2021-10-26 ENCOUNTER — Encounter: Payer: Self-pay | Admitting: Podiatry

## 2021-10-26 ENCOUNTER — Other Ambulatory Visit: Payer: Self-pay | Admitting: Family

## 2021-10-26 VITALS — BP 164/91 | HR 82 | Resp 14 | Ht 72.0 in | Wt 254.3 lb

## 2021-10-26 DIAGNOSIS — I5023 Acute on chronic systolic (congestive) heart failure: Secondary | ICD-10-CM

## 2021-10-26 DIAGNOSIS — I340 Nonrheumatic mitral (valve) insufficiency: Secondary | ICD-10-CM | POA: Insufficient documentation

## 2021-10-26 DIAGNOSIS — M722 Plantar fascial fibromatosis: Secondary | ICD-10-CM | POA: Diagnosis not present

## 2021-10-26 DIAGNOSIS — R778 Other specified abnormalities of plasma proteins: Secondary | ICD-10-CM | POA: Insufficient documentation

## 2021-10-26 DIAGNOSIS — Z87891 Personal history of nicotine dependence: Secondary | ICD-10-CM | POA: Insufficient documentation

## 2021-10-26 DIAGNOSIS — Z794 Long term (current) use of insulin: Secondary | ICD-10-CM

## 2021-10-26 DIAGNOSIS — J449 Chronic obstructive pulmonary disease, unspecified: Secondary | ICD-10-CM | POA: Insufficient documentation

## 2021-10-26 DIAGNOSIS — M25571 Pain in right ankle and joints of right foot: Secondary | ICD-10-CM | POA: Insufficient documentation

## 2021-10-26 DIAGNOSIS — I130 Hypertensive heart and chronic kidney disease with heart failure and stage 1 through stage 4 chronic kidney disease, or unspecified chronic kidney disease: Secondary | ICD-10-CM | POA: Insufficient documentation

## 2021-10-26 DIAGNOSIS — Z7984 Long term (current) use of oral hypoglycemic drugs: Secondary | ICD-10-CM | POA: Insufficient documentation

## 2021-10-26 DIAGNOSIS — I272 Pulmonary hypertension, unspecified: Secondary | ICD-10-CM | POA: Insufficient documentation

## 2021-10-26 DIAGNOSIS — N182 Chronic kidney disease, stage 2 (mild): Secondary | ICD-10-CM | POA: Insufficient documentation

## 2021-10-26 DIAGNOSIS — R0982 Postnasal drip: Secondary | ICD-10-CM | POA: Insufficient documentation

## 2021-10-26 DIAGNOSIS — I10 Essential (primary) hypertension: Secondary | ICD-10-CM | POA: Diagnosis not present

## 2021-10-26 DIAGNOSIS — G479 Sleep disorder, unspecified: Secondary | ICD-10-CM | POA: Insufficient documentation

## 2021-10-26 DIAGNOSIS — E1122 Type 2 diabetes mellitus with diabetic chronic kidney disease: Secondary | ICD-10-CM | POA: Insufficient documentation

## 2021-10-26 DIAGNOSIS — E119 Type 2 diabetes mellitus without complications: Secondary | ICD-10-CM

## 2021-10-26 DIAGNOSIS — I2510 Atherosclerotic heart disease of native coronary artery without angina pectoris: Secondary | ICD-10-CM | POA: Insufficient documentation

## 2021-10-26 DIAGNOSIS — Z79899 Other long term (current) drug therapy: Secondary | ICD-10-CM | POA: Insufficient documentation

## 2021-10-26 LAB — BASIC METABOLIC PANEL
Anion gap: 8 (ref 5–15)
BUN: 24 mg/dL — ABNORMAL HIGH (ref 6–20)
CO2: 26 mmol/L (ref 22–32)
Calcium: 9.1 mg/dL (ref 8.9–10.3)
Chloride: 103 mmol/L (ref 98–111)
Creatinine, Ser: 1.17 mg/dL (ref 0.61–1.24)
GFR, Estimated: >60 mL/min (ref >60)
Glucose, Bld: 235 mg/dL — ABNORMAL HIGH (ref 70–99)
Potassium: 3.5 mmol/L (ref 3.5–5.1)
Sodium: 137 mmol/L (ref 135–145)

## 2021-10-26 IMAGING — CR DG CHEST 2V
2 series · 2 of 2 positions shown · non-contrast
Comparison: 12/31/2019

CLINICAL DATA: Chest pain

EXAM:
CHEST - 2 VIEW

[chest pa]
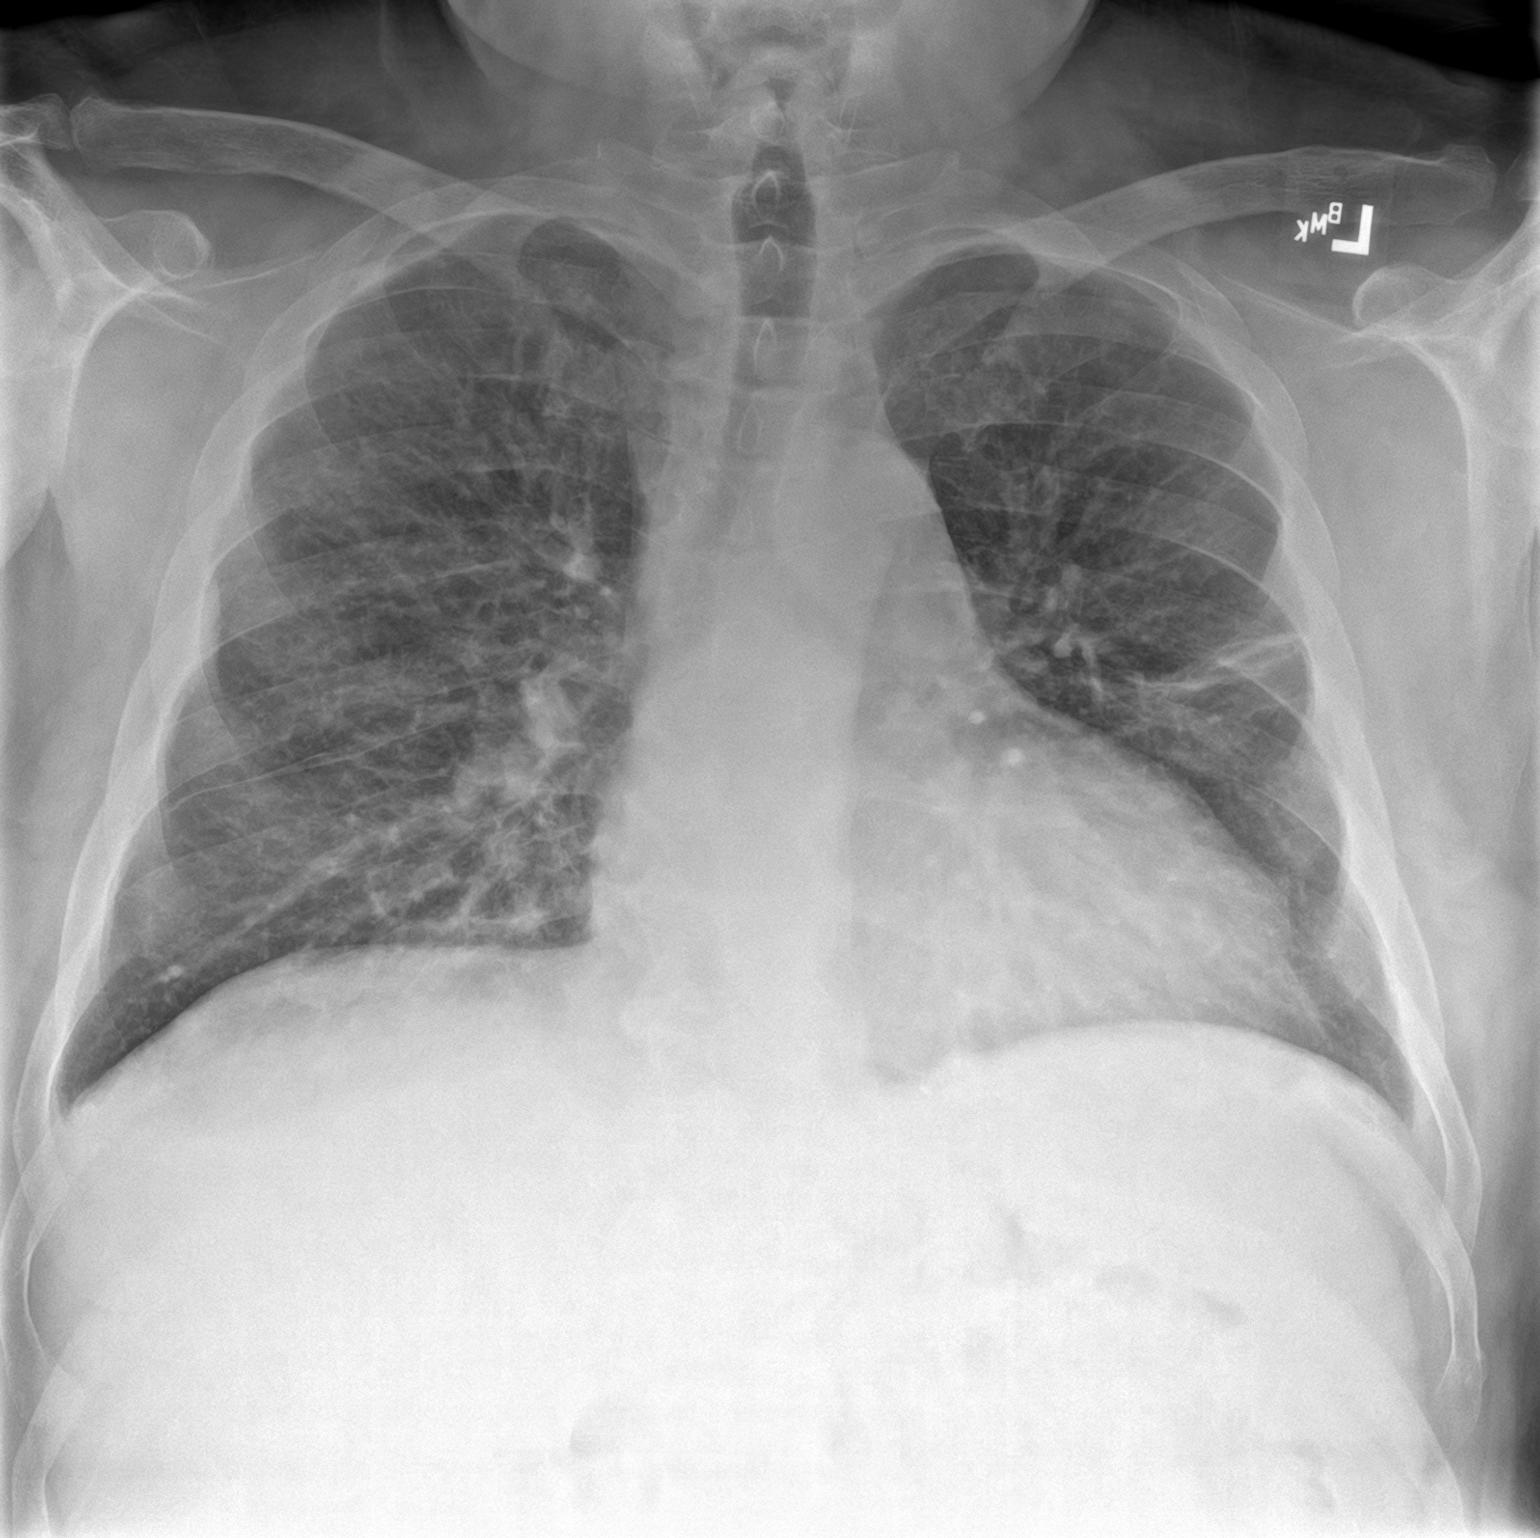

[chest lat]
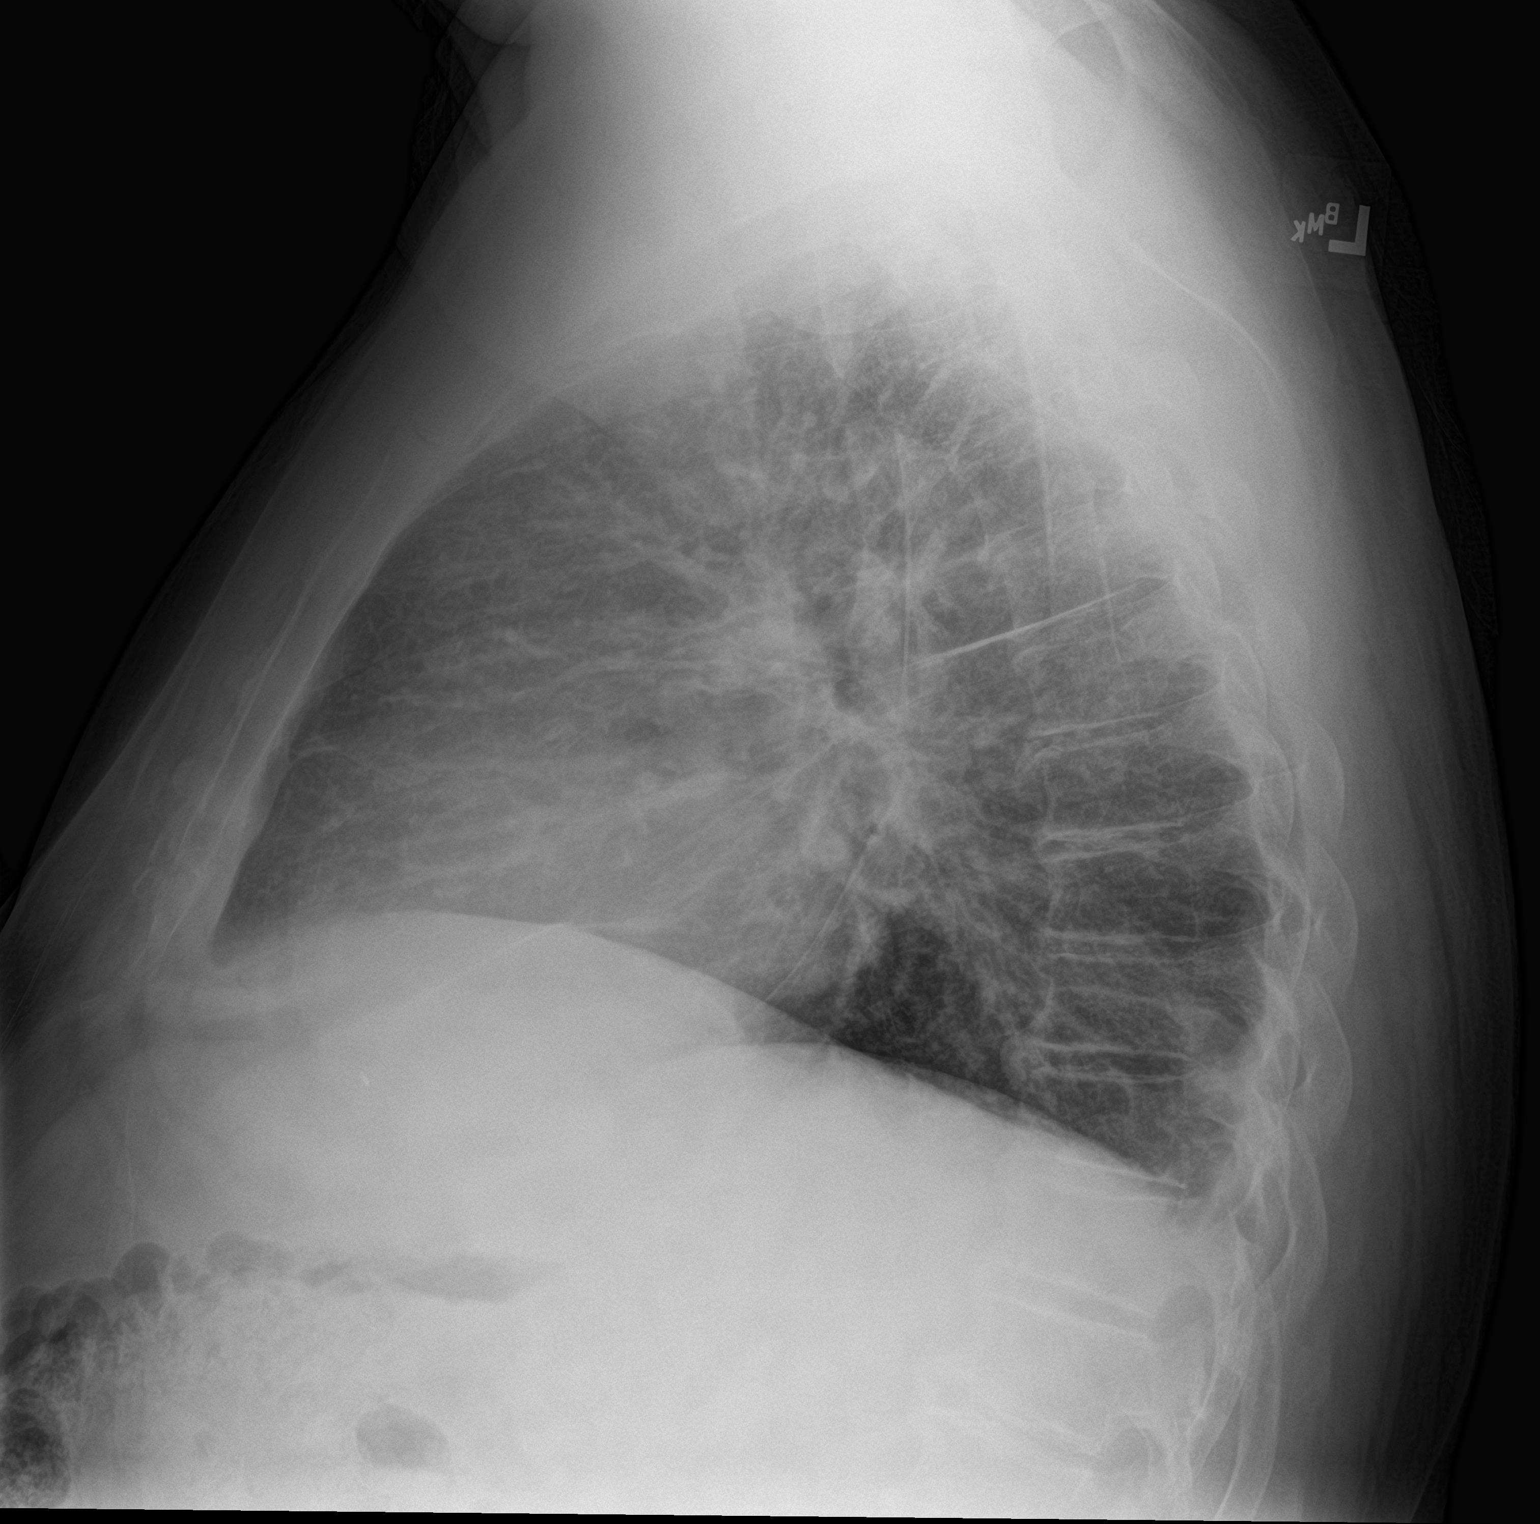

[2 of 2 positions shown; findings below may reference images not displayed]

FINDINGS: Cardiomegaly with chronic appearing bronchitic change. Linear
atelectasis or scar in the left mid lung. Borderline to mild
cardiomegaly. No pleural effusion or pneumothorax.
IMPRESSION: Cardiomegaly with chronic appearing bronchitic change. No acute
airspace disease.

## 2021-10-26 MED ORDER — FUROSEMIDE 10 MG/ML IJ SOLN: 80.0000 mg | Freq: Once | INTRAMUSCULAR | Status: AC

## 2021-10-26 MED ORDER — POTASSIUM CHLORIDE CRYS ER 20 MEQ PO TBCR: 40.0000 meq | EXTENDED_RELEASE_TABLET | Freq: Once | ORAL | Status: AC

## 2021-10-26 MED ORDER — POTASSIUM CHLORIDE CRYS ER 20 MEQ PO TBCR
EXTENDED_RELEASE_TABLET | ORAL | Status: AC
  Administered 2021-10-26: 40 meq via ORAL
  Filled 2021-10-26: qty 2

## 2021-10-26 MED ORDER — SACUBITRIL-VALSARTAN 24-26 MG PO TABS: 1.0000 | ORAL_TABLET | Freq: Two times a day (BID) | ORAL | 3 refills | Status: DC

## 2021-10-26 MED ORDER — BETAMETHASONE SOD PHOS & ACET 6 (3-3) MG/ML IJ SUSP
3.0000 mg | Freq: Once | INTRAMUSCULAR | Status: AC
  Administered 2021-10-26: 3 mg via INTRA_ARTICULAR

## 2021-10-26 MED ORDER — FUROSEMIDE 10 MG/ML IJ SOLN
INTRAMUSCULAR | Status: AC
  Administered 2021-10-26: 80 mg via INTRAVENOUS
  Filled 2021-10-26: qty 8

## 2021-10-26 NOTE — Progress Notes (Signed)
? ?Subjective: ?51 y.o. male presenting today for follow-up evaluation regarding plantar fasciitis to the bilateral arches of the feet.  He says that the injections helped significantly.  He continues to have some residual pain however.  He presents for further treatment and evaluation ? ?Past Medical History:  ?Diagnosis Date  ? CAD (coronary artery disease)   ? a. 04/2015 low risk MV;  b. 12/2016 Cath: minor irregs in LAD/Diag/LCX/OM, RCA 40p/m/d; c. 05/2020 Cath: LM nl, LAD 50d, LCX 30p, OM1 40, RCA 100p w/ L->L collats. CO/CI 3.1/1.3-->Med rx.  ? Chest wall pain, chronic   ? CHF (congestive heart failure) ([place])   ? Chronic Troponin Elevation   ? CKD (chronic kidney disease), stage II-III   ? COPD (chronic obstructive pulmonary disease) ([place])   ? Diabetes mellitus without complication ([place])   ? HFrEF (heart failure with reduced ejection fraction) ([place])   ? Hypertension   ? Mitral regurgitation   ? Mild to moderate by October 2021 echocardiogram.  ? Mixed Ischemic & NICM (nonischemic cardiomyopathy) ([place])   ? a. 03/2015 Echo: EF 45-50%; b. 12/2015 Echo: EF 20-25%; c. 02/2016 Echo: EF 30-35%; d. 11/2016 Echo: EF 40-45%; e. 06/2019 Echo: EF 30-35%; f. 11/2019 Echo: EF 25-30%; g. 05/2020 Echo: EF 35-40%, glob HK, nl RV size/fxn, PASP 44.13mHg, mod dil LA. Mild to mod MR.  ? Myocardial infarct ([facility]   ? NSVT (nonsustained ventricular tachycardia)   ? a. 12/2015 noted on tele-->amio;  b. 12/2015 Event monitor: no VT noted.  ? Obesity (BMI 30.0-34.9)   ? Psoriasis   ? Recurrent pulmonary emboli ([facility] 06/07/2020  ? 06/07/20: small bilateral PEs.  12/31/19: RUL and RLL PEs.  ? Syncope   ? a. 01/2016 - felt to be vasovagal.  ? ?Past Surgical History:  ?Procedure Laterality Date  ? AMPUTATION    ? CARDIAC CATHETERIZATION    ? FINGER AMPUTATION    ? Traumatic  ? FINGER FRACTURE SURGERY Left   ? LEFT HEART CATH AND CORONARY ANGIOGRAPHY N/A 01/06/2017  ? Procedure: Left Heart Cath and Coronary Angiography;  Surgeon: AWellington Hampshire  MD;  [facility];  Service: Cardiovascular;  Laterality: N/A;  ? RIGHT/LEFT HEART CATH AND CORONARY ANGIOGRAPHY N/A 06/13/2020  ? Procedure: RIGHT/LEFT HEART CATH AND CORONARY ANGIOGRAPHY;  Surgeon: ENelva Bush MD;  [facility];  Service: Cardiovascular;  Laterality: N/A;  ? ?Allergies  ?Allergen Reactions  ? Metformin And Related Nausea And Vomiting  ? Prednisone Other (See Comments)  ?  Reaction: Hallucinations  ?  ? Zantac [Ranitidine Hcl] Diarrhea and Nausea Only  ?  Night sweats  ? ?Objective: ?Physical Exam ?General: The patient is alert and oriented x3 in no acute distress. ? ?Dermatology: Skin is warm, dry and supple bilateral lower extremities. Negative for open lesions or macerations bilateral.  ? ?Vascular: Dorsalis Pedis and Posterior Tibial pulses palpable bilateral.  Capillary fill time is immediate to all digits. ? ?Neurological: Epicritic and protective threshold intact bilateral.  ? ?Musculoskeletal: There continues to be some residual tenderness to palpation to the plantar aspect of the bilateral heels and along the plantar fascia. All other joints range of motion within normal limits bilateral. Strength 5/5 in all groups bilateral.  ? ?Assessment: ?1. plantar fasciitis bilateral feet; including along the medial longitudinal arch ? ?Plan of Care:  ?1. Patient evaluated.   ?2. Injection of 0.5cc Celestone soluspan injected into the bilateral heels.  ?3.  Patient allergic to oral steroid.  He says it gives him hallucinations ?4.  No NSAIDs prescribed.  Patient on anticoagulant medication and CKD. ?5.  Continue plantar fascial braces as needed ?6.  Today we did discuss arch supports for the feet both custom and OTC.  For now the patient is going to try some OTC arch supports and contact his insurance to see if they cover custom orthotics. ?7. Return to clinic in 4 weeks.   ? ?*Works for [employer] ? ?Edrick Kins, DPM ?[place] ? ?Dr. Edrick Kins, DPM  ?  ?[address]                                   ?[place], [place] [address]                ?Office [phone]  ?Fax [phone] ? ? ? ? ?

## 2021-10-26 NOTE — Patient Instructions (Signed)
Continue weighing daily and call for an overnight weight gain of 3 pounds or more or a weekly weight gain of more than 5 pounds.   If you have voicemail, please make sure your mailbox is cleaned out so that we may leave a message and please make sure to listen to any voicemails.     

## 2021-10-28 NOTE — Progress Notes (Deleted)
Patient ID: Gabriel [name]., male    DOB: 1970-11-21, 51 y.o.   MRN: [id number]  HPI  Gabriel [name] is a 51 y/o male with a history of HTN, diabetes, COPD, CKD, asthma, previous tobacco use and chronic heart failure.   Echo results from 05/21/21 reviewed and showed an EF of 40-45% along with mild LVH and mild Gabriel. Echo report from 05/21/21 reviewed and showed an EF of 40-45% along with mild LVH and mild Gabriel. Echo report from 06/08/20 reviewed and showed an EF of 35-40% along with mildly elevated PA pressure of 44.7 mmHg, moderate LAE and mild/moderate Gabriel. Echo report from 11/23/19 reviewed and showed an EF of 25-30% with mild LVH and moderately elevated PA Pressure. Echo report from 06/26/2019 reviewed and showed an EF of 30-35% along with moderately elevated PA pressure. Echo done 04/29/17 showed EF 45-50%. Echo report that was done 11/28/16 and it showed an EF of 40-45% along with mild Gabriel. EF has improved from 30-35% July 2017.  RHC/LHC done 06/13/20 and showed: Severe single-vessel coronary artery disease with chronic total occlusion of the proximal RCA.  The distal vessel fills via left to right collaterals. Mild to moderate, nonobstructive CAD involving the LAD and LCx with up to 50% stenosis. Mildly elevated left and right heart filling pressures. Moderate pulmonary hypertension (mean PAP 40 mmHg) with significantly elevated pulmonary vascular resistance (PVR 7.4 WU). Severely reduced Fick cardiac output/index.  Admitted 09/27/21 due to worsening SOB due to HF exacerbation. Initially given IV lasix with transition to oral diuretics with resultant loss of 6L. Cardiology consult obtained. Discharged after 2 days. Admitted 08/09/21 due to acute on chronic heart failure. Initially given IV lasix with transition to oral diuretics. Entresto held due to AKI but then resumed prior to discharge. Discharged after 4 days. Admitted 05/20/21 due to chest pain and shortness of breath. BNP elevated so IV lasix given.  Cardiology consult obtained. Entresto and IV lasix had to be held due to rising creatinine. Chronically elevated troponins. Discharged after 3 days.   He presents today for a follow-up visit with a chief complaint of   Received '80mg'$  IV lasix/ 21mq PO potassium last week along with resuming entresto.   Past Medical History:  Diagnosis Date   CAD (coronary artery disease)    a. 04/2015 low risk MV;  b. 12/2016 Cath: minor irregs in LAD/Diag/LCX/OM, RCA 40p/m/d; c. 05/2020 Cath: LM nl, LAD 50d, LCX 30p, OM1 40, RCA 100p w/ L->L collats. CO/CI 3.1/1.3-->Med rx.   Chest wall pain, chronic    CHF (congestive heart failure) (HCC)    Chronic Troponin Elevation    CKD (chronic kidney disease), stage II-III    COPD (chronic obstructive pulmonary disease) (HCC)    Diabetes mellitus without complication (HCC)    HFrEF (heart failure with reduced ejection fraction) ([place]    Hypertension    Mitral regurgitation    Mild to moderate by October 2021 echocardiogram.   Mixed Ischemic & NICM (nonischemic cardiomyopathy) [place]    a. 03/2015 Echo: EF 45-50%; b. 12/2015 Echo: EF 20-25%; c. 02/2016 Echo: EF 30-35%; d. 11/2016 Echo: EF 40-45%; e. 06/2019 Echo: EF 30-35%; f. 11/2019 Echo: EF 25-30%; g. 05/2020 Echo: EF 35-40%, glob HK, nl RV size/fxn, PASP 44.743mg, mod dil LA. Mild to mod Gabriel.   Myocardial infarct (HCC)    NSVT (nonsustained ventricular tachycardia)    a. 12/2015 noted on tele-->amio;  b. 12/2015 Event monitor: no VT noted.   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  12/31/19: RUL and RLL PEs.   Syncope    a. 01/2016 - felt to be vasovagal.   Past Surgical History:  Procedure Laterality Date   AMPUTATION     CARDIAC CATHETERIZATION     FINGER AMPUTATION     Traumatic   FINGER FRACTURE SURGERY Left    LEFT HEART CATH AND CORONARY ANGIOGRAPHY N/A 01/06/2017   Procedure: Left Heart Cath and Coronary Angiography;  Surgeon:  Wellington Hampshire, MD;  Location: [facility];  Service: Cardiovascular;  Laterality: N/A;   RIGHT/LEFT HEART CATH AND CORONARY ANGIOGRAPHY N/A 06/13/2020   Procedure: RIGHT/LEFT HEART CATH AND CORONARY ANGIOGRAPHY;  Surgeon: Nelva Bush, MD;  Location: [facility];  Service: Cardiovascular;  Laterality: N/A;   Family History  Problem Relation Age of Onset   Diabetes Mother    Diabetes Mellitus II Mother    Hypothyroidism Mother    Hypertension Mother    Kidney failure Mother        Dialysis   Heart attack Mother        42 yo approximately   Hypertension Father    Gout Father    Cancer Maternal Grandfather    Diabetes Maternal Grandfather    Cancer Paternal Aunt    Social History   Tobacco Use   Smoking status: Former    Packs/day: 0.50    Years: 33.00    Pack years: 16.50    Types: Cigarettes    Quit date: 05/08/2020    Years since quitting: 1.4   Smokeless tobacco: Former    Quit date: 05/08/2020   Tobacco comments:    Quit Sept 2021  Substance Use Topics   Alcohol use: Yes    Alcohol/week: 0.0 standard drinks    Comment: occassionally   Allergies  Allergen Reactions   Metformin And Related Nausea And Vomiting   Prednisone Other (See Comments)    Reaction: Hallucinations     Zantac [Ranitidine Hcl] Diarrhea and Nausea Only    Night sweats      Review of Systems  Constitutional:  Positive for fatigue (easily). Negative for appetite change.  HENT:  Positive for postnasal drip. Negative for congestion and sore throat.   Eyes: Negative.   Respiratory:  Positive for cough and shortness of breath (worsening). Negative for chest tightness and wheezing.   Cardiovascular:  Positive for chest pain (intermittent). Negative for palpitations and leg swelling.  Gastrointestinal:  Positive for abdominal distention. Negative for abdominal pain.  Endocrine: Negative.   Genitourinary: Negative.   Musculoskeletal:  Positive for arthralgias (right foot).   Skin: Negative.   Allergic/Immunologic: Negative.   Neurological:  Negative for dizziness and light-headedness.  Hematological:  Negative for adenopathy. Does not bruise/bleed easily.  Psychiatric/Behavioral:  Positive for sleep disturbance (sleeping on 1 pillow). Negative for dysphoric mood. The patient is not nervous/anxious.      Physical Exam Vitals and nursing note reviewed.  Constitutional:      Appearance: He is well-developed.  HENT:     Head: Normocephalic and atraumatic.  Neck:     Vascular: No JVD.  Cardiovascular:     Rate and Rhythm: Normal rate and regular rhythm.  Pulmonary:     Effort: Pulmonary effort is normal.     Breath sounds: No wheezing or rales.  Chest:     Chest wall: No tenderness.  Abdominal:  General: There is distension.     Tenderness: There is no abdominal tenderness.  Musculoskeletal:        General: No tenderness.     Cervical back: Normal range of motion and neck supple.     Right lower leg: Edema (trace pitting) present.     Left lower leg: Edema (trace pitting) present.  Skin:    General: Skin is warm and dry.  Neurological:     Mental Status: He is alert and oriented to person, place, and time.  Psychiatric:        Behavior: Behavior normal.        Thought Content: Thought content normal.   Assessment & Plan:  1: Acute on Chronic heart failure with reduced ejection fraction- - NYHA class III - moderately fluid overloaded today with weight gain, abdominal distention and worsening SOB - weighing daily and reminded to call for an overnight weight gain of >2 pounds or a weekly weight gain of >5 pounds; weight at home has been rising.  - weight 254.5 pounds since last visit here 3 days ago - received '80mg'$  IV lasix/ 68mq PO potassium last week - not adding salt and he was encouraged to closely follow a '2000mg'$  sodium diet - saw cardiology (Kathlen Mody 06/18/21 - on GDMT of jardiance, carvedilol & entresto - saw EP (Caryl Comes 09/02/19;  discussion about ICD  - [facility] - BNP from 09/27/21 was 1008.2  2: HTN- - BP  - saw PCP (Rollene Rotunda 09/05/21 - BMP from 10/26/21 reviewed and showed sodium 137, potassium 3.5, creatinine 1.17 and GFR >60  3: Diabetes-   -  A1c 8.2% on 09/27/21 - completed some classes at the [facility]   Medication bottles reviewed.

## 2021-10-29 ENCOUNTER — Other Ambulatory Visit: Payer: Self-pay

## 2021-10-29 ENCOUNTER — Encounter: Payer: Self-pay | Admitting: Family

## 2021-10-29 ENCOUNTER — Ambulatory Visit: Payer: BC Managed Care – PPO | Attending: Family | Admitting: Family

## 2021-10-29 ENCOUNTER — Other Ambulatory Visit
Admission: RE | Admit: 2021-10-29 | Discharge: 2021-10-29 | Disposition: A | Discharge status: Home / Self Care | Payer: BC Managed Care – PPO | Source: Ambulatory Visit | Attending: Family | Admitting: Family

## 2021-10-29 VITALS — BP 139/84 | HR 80 | Resp 20 | Ht 72.0 in | Wt 238.1 lb

## 2021-10-29 DIAGNOSIS — R778 Other specified abnormalities of plasma proteins: Secondary | ICD-10-CM | POA: Diagnosis not present

## 2021-10-29 DIAGNOSIS — Z794 Long term (current) use of insulin: Secondary | ICD-10-CM | POA: Diagnosis not present

## 2021-10-29 DIAGNOSIS — I10 Essential (primary) hypertension: Secondary | ICD-10-CM | POA: Diagnosis not present

## 2021-10-29 DIAGNOSIS — E119 Type 2 diabetes mellitus without complications: Secondary | ICD-10-CM

## 2021-10-29 DIAGNOSIS — I272 Pulmonary hypertension, unspecified: Secondary | ICD-10-CM | POA: Insufficient documentation

## 2021-10-29 DIAGNOSIS — J449 Chronic obstructive pulmonary disease, unspecified: Secondary | ICD-10-CM | POA: Insufficient documentation

## 2021-10-29 DIAGNOSIS — Z7985 Long-term (current) use of injectable non-insulin antidiabetic drugs: Secondary | ICD-10-CM | POA: Insufficient documentation

## 2021-10-29 DIAGNOSIS — I130 Hypertensive heart and chronic kidney disease with heart failure and stage 1 through stage 4 chronic kidney disease, or unspecified chronic kidney disease: Secondary | ICD-10-CM | POA: Diagnosis not present

## 2021-10-29 DIAGNOSIS — N182 Chronic kidney disease, stage 2 (mild): Secondary | ICD-10-CM | POA: Insufficient documentation

## 2021-10-29 DIAGNOSIS — Z79899 Other long term (current) drug therapy: Secondary | ICD-10-CM | POA: Insufficient documentation

## 2021-10-29 DIAGNOSIS — E1122 Type 2 diabetes mellitus with diabetic chronic kidney disease: Secondary | ICD-10-CM | POA: Insufficient documentation

## 2021-10-29 DIAGNOSIS — Z87891 Personal history of nicotine dependence: Secondary | ICD-10-CM | POA: Diagnosis not present

## 2021-10-29 DIAGNOSIS — I2510 Atherosclerotic heart disease of native coronary artery without angina pectoris: Secondary | ICD-10-CM | POA: Insufficient documentation

## 2021-10-29 DIAGNOSIS — I5022 Chronic systolic (congestive) heart failure: Secondary | ICD-10-CM | POA: Diagnosis not present

## 2021-10-29 LAB — BASIC METABOLIC PANEL
Anion gap: 11 (ref 5–15)
Anion gap: 9 (ref 5–15)
BUN: 37 mg/dL — ABNORMAL HIGH (ref 6–20)
BUN: 38 mg/dL — ABNORMAL HIGH (ref 6–20)
CO2: 30 mmol/L (ref 22–32)
CO2: 31 mmol/L (ref 22–32)
Calcium: 9.3 mg/dL (ref 8.9–10.3)
Calcium: 9.3 mg/dL (ref 8.9–10.3)
Chloride: 95 mmol/L — ABNORMAL LOW (ref 98–111)
Chloride: 97 mmol/L — ABNORMAL LOW (ref 98–111)
Creatinine, Ser: 1.39 mg/dL — ABNORMAL HIGH (ref 0.61–1.24)
Creatinine, Ser: 1.39 mg/dL — ABNORMAL HIGH (ref 0.61–1.24)
GFR, Estimated: >60 mL/min (ref >60)
GFR, Estimated: >60 mL/min (ref >60)
Glucose, Bld: 289 mg/dL — ABNORMAL HIGH (ref 70–99)
Glucose, Bld: 294 mg/dL — ABNORMAL HIGH (ref 70–99)
Potassium: 3.7 mmol/L (ref 3.5–5.1)
Potassium: 3.7 mmol/L (ref 3.5–5.1)
Sodium: 136 mmol/L (ref 135–145)
Sodium: 137 mmol/L (ref 135–145)

## 2021-10-29 NOTE — Patient Instructions (Signed)
Go to your pharmacy and get your entresto, call us and let us know if there is an issue with getting this. ? ?Continue to weigh daily and watch your fluid and salt intake. ? ?Return in 1 month.  ?Next visit, we may want to start spironolactone ?

## 2021-10-29 NOTE — Progress Notes (Signed)
Patient ID: Gabriel Dicaprio., male    DOB: 09-28-1970, 51 y.o.   MRN: [id number]   HPI   Gabriel [name] is a 51 y/o male with a history of HTN, diabetes, COPD, CKD, asthma, previous tobacco use and chronic heart failure.    Echo results from 05/21/21 reviewed and showed an EF of 40-45% along with mild LVH and mild Gabriel. Echo report from 05/21/21 reviewed and showed an EF of 40-45% along with mild LVH and mild Gabriel. Echo report from 06/08/20 reviewed and showed an EF of 35-40% along with mildly elevated PA pressure of 44.7 mmHg, moderate LAE and mild/moderate Gabriel. Echo report from 11/23/19 reviewed and showed an EF of 25-30% with mild LVH and moderately elevated PA Pressure. Echo report from 06/26/2019 reviewed and showed an EF of 30-35% along with moderately elevated PA pressure. Echo done 04/29/17 showed EF 45-50%. Echo report that was done 11/28/16 and it showed an EF of 40-45% along with mild Gabriel. EF has improved from 30-35% July 2017.   RHC/LHC done 06/13/20 and showed: Severe single-vessel coronary artery disease with chronic total occlusion of the proximal RCA.  The distal vessel fills via left to right collaterals. Mild to moderate, nonobstructive CAD involving the LAD and LCx with up to 50% stenosis. Mildly elevated left and right heart filling pressures. Moderate pulmonary hypertension (mean PAP 40 mmHg) with significantly elevated pulmonary vascular resistance (PVR 7.4 WU). Severely reduced Fick cardiac output/index.   Admitted 09/27/21 due to worsening SOB due to HF exacerbation. Initially given IV lasix with transition to oral diuretics with resultant loss of 6L. Cardiology consult obtained. Discharged after 2 days. Admitted 08/09/21 due to acute on chronic heart failure. Initially given IV lasix with transition to oral diuretics. Entresto held due to AKI but then resumed prior to discharge. Discharged after 4 days. Admitted 05/20/21 due to chest pain and shortness of breath. BNP elevated so IV lasix given.  Cardiology consult obtained. Entresto and IV lasix had to be held due to rising creatinine. Chronically elevated troponins. Discharged after 3 days.    Received '80mg'$  IV lasix/ 22mq PO potassium last week along with resuming entresto on 10/26/21. He returns today for a follow up visit. He has no current complaints and states he feels "great". He diuresed 14 lbs. He specifically denies CP, SOB, dizziness, palpitations, weight gain, pedal or abdominal edema, and orthopnea.  He is weighing daily at home and reports and expected, steady weight loss along with increased urine output.  He resumed his entresto regimen with samples provided in office on Friday, and will f/u with his pharmacy hopefully today about picking up his prescription.      Past Medical History:  Diagnosis Date   CAD (coronary artery disease)      a. 04/2015 low risk MV;  b. 12/2016 Cath: minor irregs in LAD/Diag/LCX/OM, RCA 40p/m/d; c. 05/2020 Cath: LM nl, LAD 50d, LCX 30p, OM1 40, RCA 100p w/ L->L collats. CO/CI 3.1/1.3-->Med rx.   Chest wall pain, chronic     CHF (congestive heart failure) (HCC)     Chronic Troponin Elevation     CKD (chronic kidney disease), stage II-III     COPD (chronic obstructive pulmonary disease) (HCC)     Diabetes mellitus without complication (HCC)     HFrEF (heart failure with reduced ejection fraction) (HCC)     Hypertension     Mitral regurgitation      Mild to moderate by October 2021 echocardiogram.   Mixed  Ischemic & NICM (nonischemic cardiomyopathy) ([place])      a. 03/2015 Echo: EF 45-50%; b. 12/2015 Echo: EF 20-25%; c. 02/2016 Echo: EF 30-35%; d. 11/2016 Echo: EF 40-45%; e. 06/2019 Echo: EF 30-35%; f. 11/2019 Echo: EF 25-30%; g. 05/2020 Echo: EF 35-40%, glob HK, nl RV size/fxn, PASP 44.70mHg, mod dil LA. Mild to mod Gabriel.   Myocardial infarct (HCC)     NSVT (nonsustained ventricular tachycardia)      a. 12/2015 noted on tele-->amio;  b. 12/2015 Event monitor: no VT noted.   Obesity (BMI 30.0-34.9)      Psoriasis     Recurrent pulmonary emboli ([facility] 06/07/2020    06/07/20: small bilateral PEs.  12/31/19: RUL and RLL PEs.   Syncope      a. 01/2016 - felt to be vasovagal.         Past Surgical History:  Procedure Laterality Date   AMPUTATION       CARDIAC CATHETERIZATION       FINGER AMPUTATION        Traumatic   FINGER FRACTURE SURGERY Left     LEFT HEART CATH AND CORONARY ANGIOGRAPHY N/A 01/06/2017    Procedure: Left Heart Cath and Coronary Angiography;  Surgeon: AWellington Hampshire MD;  [facility];  Service: Cardiovascular;  Laterality: N/A;   RIGHT/LEFT HEART CATH AND CORONARY ANGIOGRAPHY N/A 06/13/2020    Procedure: RIGHT/LEFT HEART CATH AND CORONARY ANGIOGRAPHY;  Surgeon: ENelva Bush MD;  [facility];  Service: Cardiovascular;  Laterality: N/A;         Family History  Problem Relation Age of Onset   Diabetes Mother     Diabetes Mellitus II Mother     Hypothyroidism Mother     Hypertension Mother     Kidney failure Mother          Dialysis   Heart attack Mother          450yo approximately   Hypertension Father     Gout Father     Cancer Maternal Grandfather     Diabetes Maternal Grandfather     Cancer Paternal Aunt      Social History         Tobacco Use   Smoking status: Former      Packs/day: 0.50      Years: 33.00      Pack years: 16.50      Types: Cigarettes      Quit date: 05/08/2020      Years since quitting: 1.4   Smokeless tobacco: Former      Quit date: 05/08/2020   Tobacco comments:      Quit Sept 2021  Substance Use Topics   Alcohol use: Yes      Alcohol/week: 0.0 standard drinks      Comment: occassionally         Allergies  Allergen Reactions   Metformin And Related Nausea And Vomiting   Prednisone Other (See Comments)      Reaction: Hallucinations      Zantac [Ranitidine Hcl] Diarrhea and Nausea Only      Night sweats    Prior to Admission medications   Medication Sig Start Date End Date  Taking? Authorizing Provider  carvedilol (COREG) 6.25 MG tablet Take 1 tablet (6.25 mg total) by mouth 2 (two) times daily with a meal. NEED FOLLOW UP APPOINTMENT FOR ANYMORE REFILLS 10/18/21  Yes MLarey Dresser MD  Continuous Blood Gluc Receiver (FREESTYLE [place]  2 READER) DEVI Use to monitor blood sugars as directed 10/02/21  Yes [name], FNP  Continuous Blood Gluc Sensor (FREESTYLE LIBRE 2 SENSOR) MISC Use to monitor blood sugars as directed 10/02/21  Yes Tally Joe T, FNP  Dulaglutide (TRULICITY) 1.5 VP/7.1GG SOPN Inject 1.5 mg into the skin once a week. 10/08/21  Yes Tally Joe T, FNP  ezetimibe (ZETIA) 10 MG tablet Take 10 mg by mouth daily.   Yes [provider]  fenofibrate (TRICOR) 145 MG tablet Take 1 tablet (145 mg total) by mouth daily. 09/05/21  Yes Tally Joe T, FNP  Insulin Glargine (BASAGLAR KWIKPEN) 100 UNIT/ML Inject 28 Units into the skin at bedtime. 09/05/21 09/26/22 Yes [name], FNP  insulin lispro (HUMALOG) 100 UNIT/ML KwikPen Inject 3 Units into the skin 3 (three) times daily. 08/13/21 11/11/21 Yes [name], MD  Insulin Pen Needle (PEN NEEDLES) 32G X 4 MM MISC 200 each by Does not apply route at bedtime. 09/05/21  Yes Tally Joe T, FNP  Ipratropium-Albuterol (COMBIVENT RESPIMAT) 20-100 MCG/ACT AERS respimat Inhale 1 puff into the lungs every 6 (six) hours as needed for wheezing.   Yes [provider]  metolazone (ZAROXOLYN) 5 MG tablet TAKE 1 TABLET (5 MG TOTAL) BY MOUTH DAILY AS NEEDED (FOR WEIGHT OVER 245). 09/05/21  Yes [name], FNP  nitroGLYCERIN (NITROSTAT) 0.4 MG SL tablet Place 1 tablet (0.4 mg total) under the tongue every 5 (five) minutes x 3 doses as needed for chest pain. 06/09/20  Yes Lorella Nimrod, MD  potassium chloride SA (KLOR-CON M20) 20 MEQ tablet Take 1 tablet daily 05/29/21  Yes Hackney, Aura Fey, FNP  rivaroxaban (XARELTO) 20 MG TABS tablet Take 1 tablet (20 mg total) by mouth daily with supper. 08/13/21 11/11/21 Yes [name], MD  rosuvastatin (CRESTOR) 20 MG tablet Take 1 tablet (20 mg total) by mouth daily. 09/05/21  Yes [name], FNP  sacubitril-valsartan (ENTRESTO) 24-26 MG Take 1 tablet by mouth 2 (two) times daily. 10/26/21  Yes Hackney, Otila Kluver A, FNP  torsemide 60 MG TABS Take 60 mg by mouth daily. 09/30/21  Yes Lorella Nimrod, MD     Review of Systems  Constitutional:  Negative for malaise/fatigue.  HENT: Negative.    Eyes:  Negative for blurred vision.  Respiratory:  Negative for cough, hemoptysis, sputum production, shortness of breath and wheezing.   Cardiovascular:  Negative for chest pain, palpitations, orthopnea and leg swelling.  Gastrointestinal: Negative.   Genitourinary: Negative.   Musculoskeletal: Negative.   Skin: Negative.   Neurological:  Negative for dizziness and headaches.  Endo/Heme/Allergies: Negative.   Psychiatric/Behavioral: Negative.       Vitals:   10/29/21 0904  BP: 139/84  Pulse: 80  Resp: 20  SpO2: 95%  Weight: 238 lb 2 oz (108 kg)  Height: 6' (1.829 m)   Wt Readings from Last 3 Encounters:  10/29/21 238 lb 2 oz (108 kg)  10/26/21 254 lb 5 oz (115.4 kg)  09/29/21 237 lb 9.6 oz (107.8 kg)   Lab Results  Component Value Date   CREATININE 1.39 (H) 10/29/2021   CREATININE 1.39 (H) 10/29/2021   CREATININE 1.17 10/26/2021   Physical Exam Constitutional:      General: He is not in acute distress.    Appearance: Normal appearance.  Cardiovascular:     Rate and Rhythm: Normal rate and regular rhythm.     Heart sounds: Murmur heard.  Pulmonary:     Effort: No respiratory  distress.     Breath sounds: No wheezing, rhonchi or rales.  Abdominal:     General: There is no distension.     Palpations: Abdomen is soft.  Musculoskeletal:        General: No swelling.  Skin:    General: Skin is warm and dry.  Neurological:     General: No focal deficit present.     Mental Status: He is alert and oriented to person, place, and time.  Psychiatric:        Mood  and Affect: Mood normal.        Thought Content: Thought content normal.        Judgment: Judgment normal.     Assessment & Plan:   1: Chronic heart failure with reduced ejection fraction- - NYHA class I - euvolemic - weighing daily and reminded to call for an overnight weight gain of >2 pounds or a weekly weight gain of >5 pounds - weight today 238, down 14 lbs from visit on Friday  - received '80mg'$  IV lasix/ 85mq PO potassium Friday - was out of entresto on Friday, samples provided and he resumed taking them - not adding salt and he was encouraged to closely follow a '2000mg'$  sodium diet - saw cardiology (Kathlen Mody 06/18/21 - on GDMT of, carvedilol & entresto - on trulicity for DM, cannot add Jardiance or Farxiga - discussed adding spironolactone next visit, he was hesitant but agreed to consider  - saw EP (Caryl Comes 09/02/19; discussion about ICD  - [facility] - BNP from 09/27/21 was 1008.2 - will check BMP today    2: HTN- - BP 139/84 - saw PCP (Rollene Rotunda 09/05/21 - BMP from 10/26/21 reviewed and showed sodium 137, potassium 3.5, creatinine 1.17 and GFR >60   3: Diabetes-   -  A1c 8.2% on 09/27/21 - completed some classes at the [facility]     He did not bring medication bottles but they were verbally reviewed and no changes other than the resumption of entresto.   Return in 1 month.

## 2021-11-03 IMAGING — CT CT ANGIO CHEST
4 of 7 series · 18 of 46 positions shown · IV contrast (APPLIED)
Comparison: 12/31/2019

CLINICAL DATA: Chest pain and shortness of breath for 2 weeks

EXAM:
CT ANGIOGRAPHY CHEST WITH CONTRAST
TECHNIQUE: Multidetector CT imaging of the chest was performed using the
standard protocol during bolus administration of intravenous
contrast. Multiplanar CT image reconstructions and MIPs were
obtained to evaluate the vascular anatomy.
CONTRAST:  100mL OMNIPAQUE IOHEXOL 350 MG/ML SOLN

[Series 4: axial pre · axial · non-contrast · 0.86mm/px · z∈[+351,+546]mm · 4 of 66 slices shown]
[im 14/66  lung]
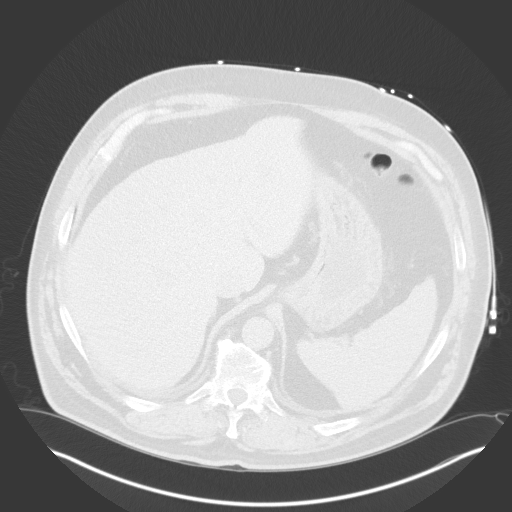
[im 27/66  lung]
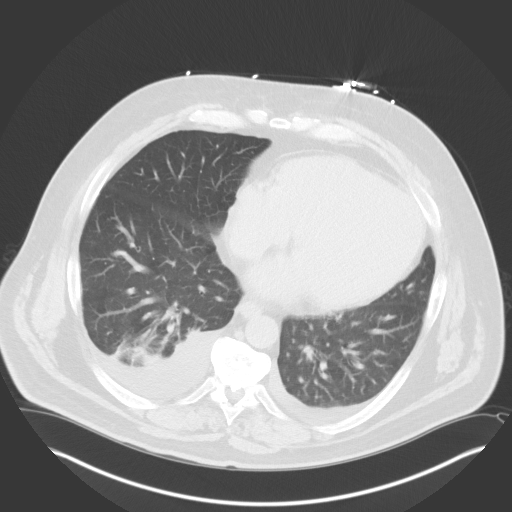
[im 40/66  lung]
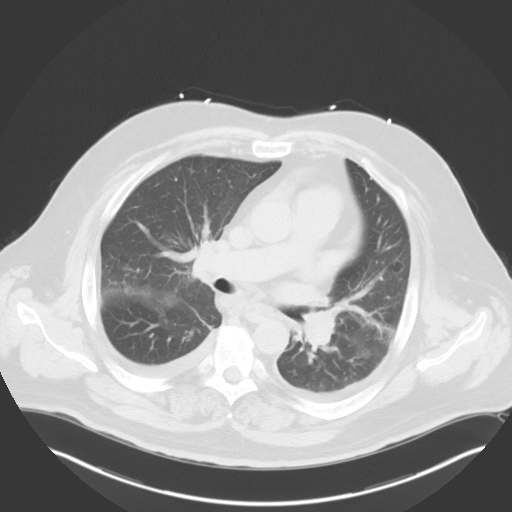
[im 53/66  lung]
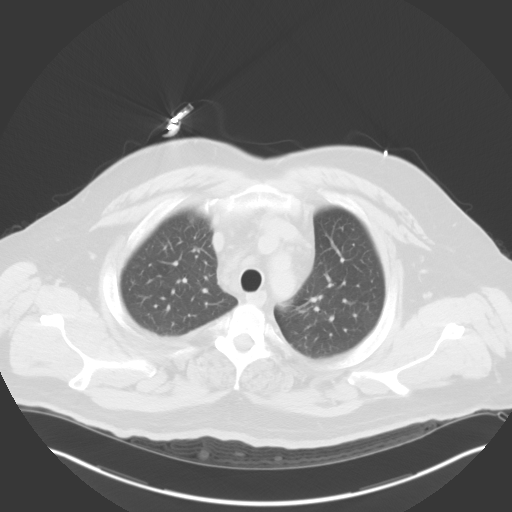

[Series 5: axial arterial · axial · arterial · 0.86mm/px · z∈[+317,+572]mm · 8 of 111 slices shown]
[im 13/111  lung]
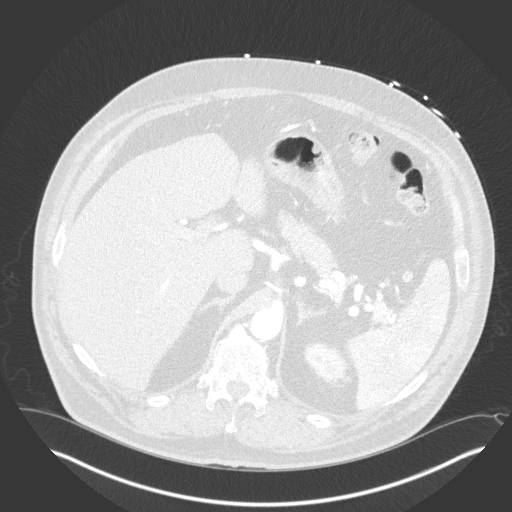
[im 25/111  soft-tissue]
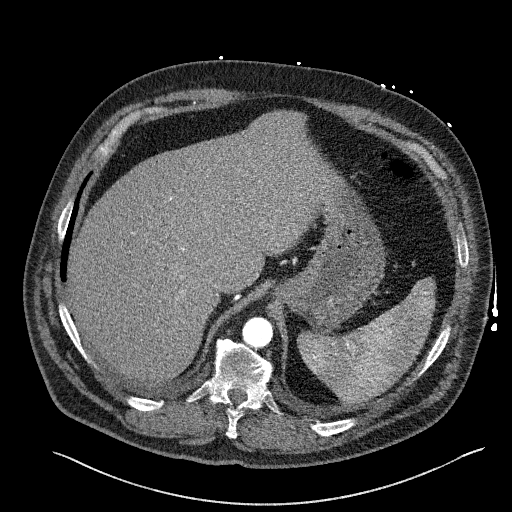
[im 37/111  lung]
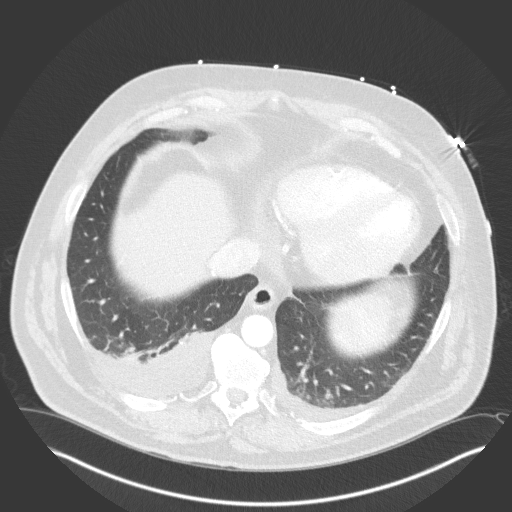
[im 49/111  soft-tissue]
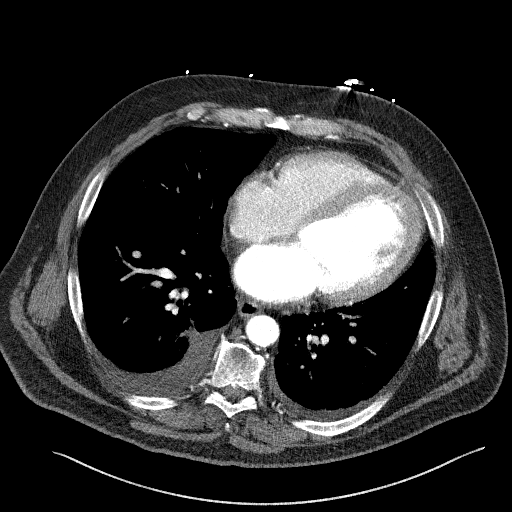
[im 62/111  lung]
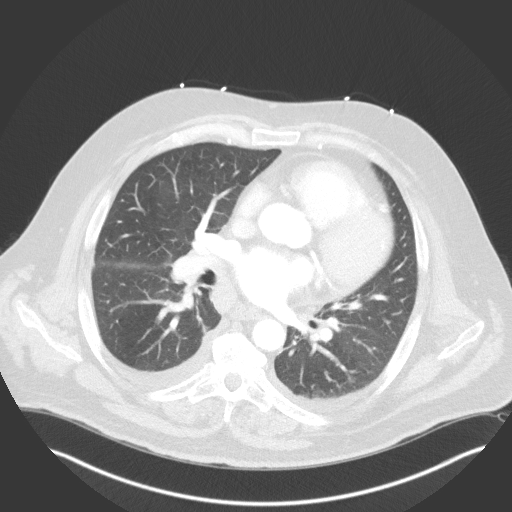
[im 74/111  soft-tissue]
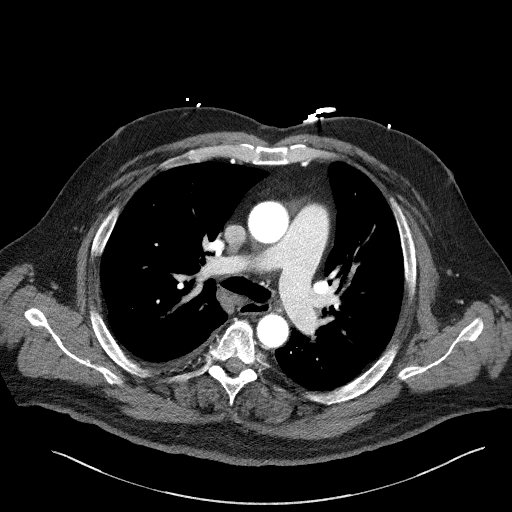
[im 86/111  lung]
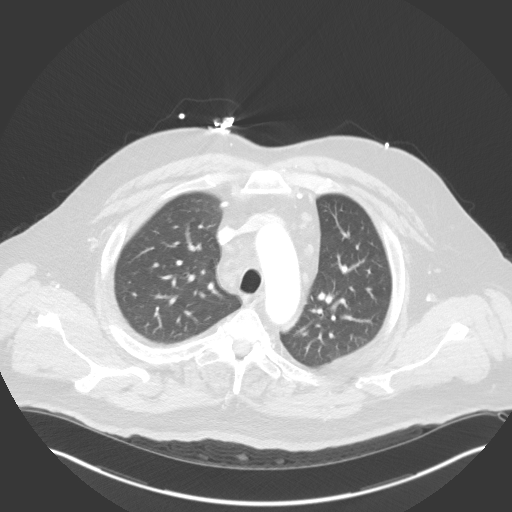
[im 98/111  soft-tissue]
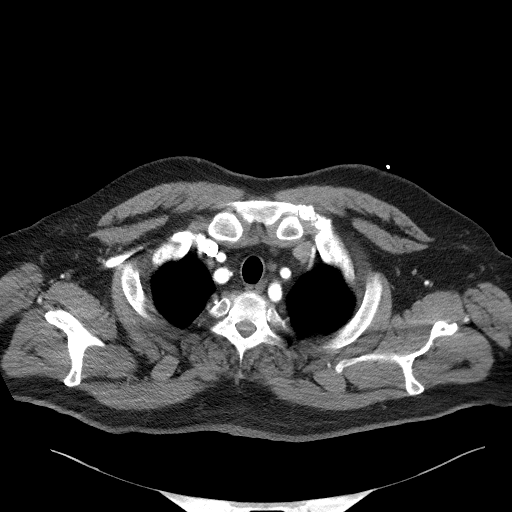

[Series 6: lung · axial · 0.86mm/px · z∈[+301,+389]mm · 3 of 167 slices shown]
[im 12/167  soft-tissue]
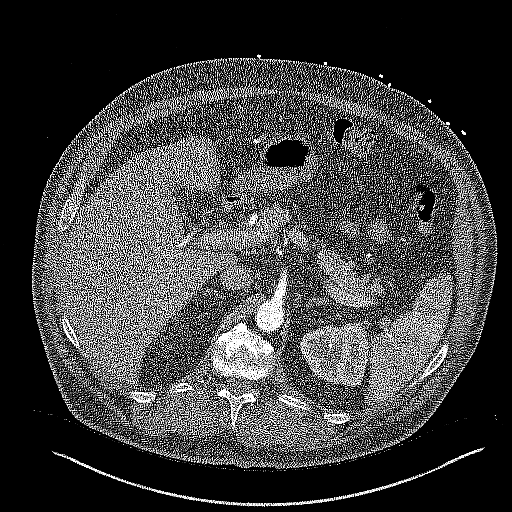
[im 34/167  soft-tissue]
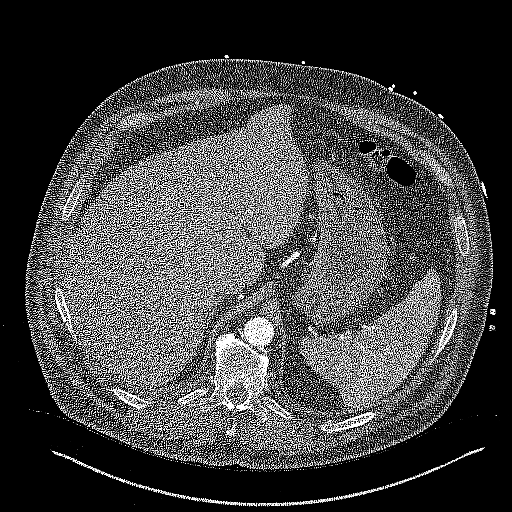
[im 56/167  soft-tissue]
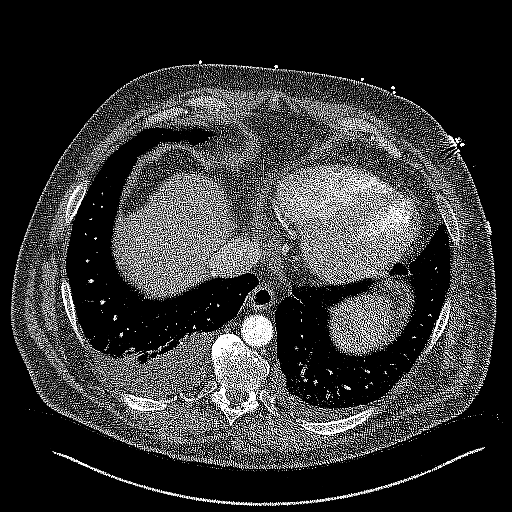

[Series 7: coronals · coronal · 0.69mm/px · 3 of 168 slices shown]
[im 42/168  soft-tissue]
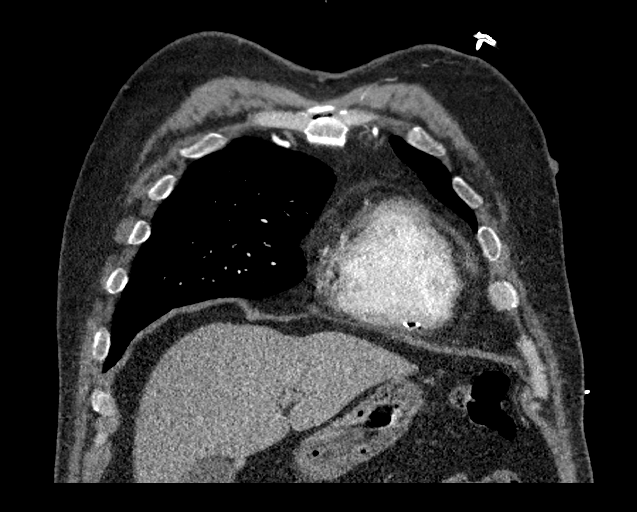
[im 84/168  soft-tissue]
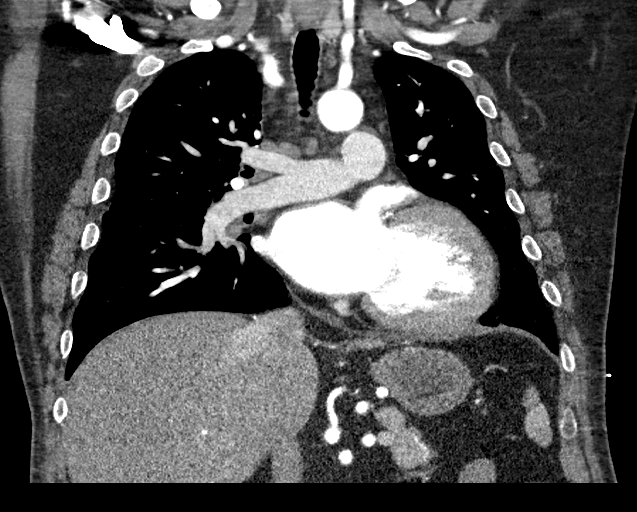
[im 126/168  soft-tissue]
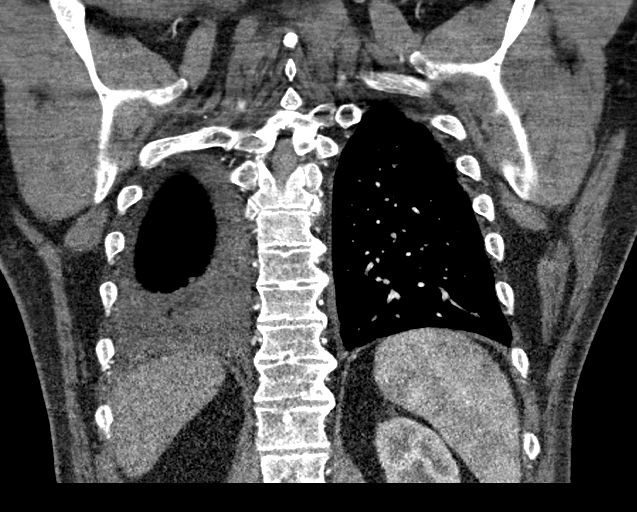

[18 of 46 positions shown; findings below may reference images not displayed]

FINDINGS: Cardiovascular: This is a technically adequate evaluation of the
thoracic aorta. No evidence of aneurysm or dissection. No
significant atherosclerosis.

The heart is mildly enlarged but stable. No pericardial effusion.
Minimal atherosclerosis within the coronary vasculature, most
pronounced in the LAD and circumflex distributions.

Mediastinum/Nodes: Stable borderline enlarged mediastinal and hilar
adenopathy, measuring up to 2 cm in the subcarinal region. Thyroid,
trachea, and esophagus are unremarkable.

Lungs/Pleura: Residual consolidation in the right lower lobe likely
reflects resolving pulmonary infarct after previous right lower lobe
pulmonary embolus. There are small bilateral pleural effusions, less
than 200 cc each. No acute airspace disease. Minimal left upper lobe
atelectasis. No pneumothorax. The central airways are patent.

Upper Abdomen: No acute abnormality.

Musculoskeletal: Stable bone island right third rib. No acute or
destructive bony lesions. Reconstructed images demonstrate no
additional findings.

Review of the MIP images confirms the above findings.
IMPRESSION: 1. No evidence of thoracic aortic aneurysm or dissection.
2. Stable mediastinal and hilar lymphadenopathy, nonspecific.
3. Improving right lower lobe consolidation, likely representing
resolving pulmonary infarct after previous right lower lobe
pulmonary embolus.
4. Trace bilateral pleural effusions.
5. Prominent coronary artery atherosclerosis, stable.

## 2021-11-03 IMAGING — CR DG CHEST 2V
1 series · 2 of 2 positions shown · non-contrast
Comparison: 05/04/2020

CLINICAL DATA: Chest pain short of breath

EXAM:
CHEST - 2 VIEW

[Series 1: dg chest 2 view · 0.14mm/px · 2 of 2 slices shown]
[im 1/2]
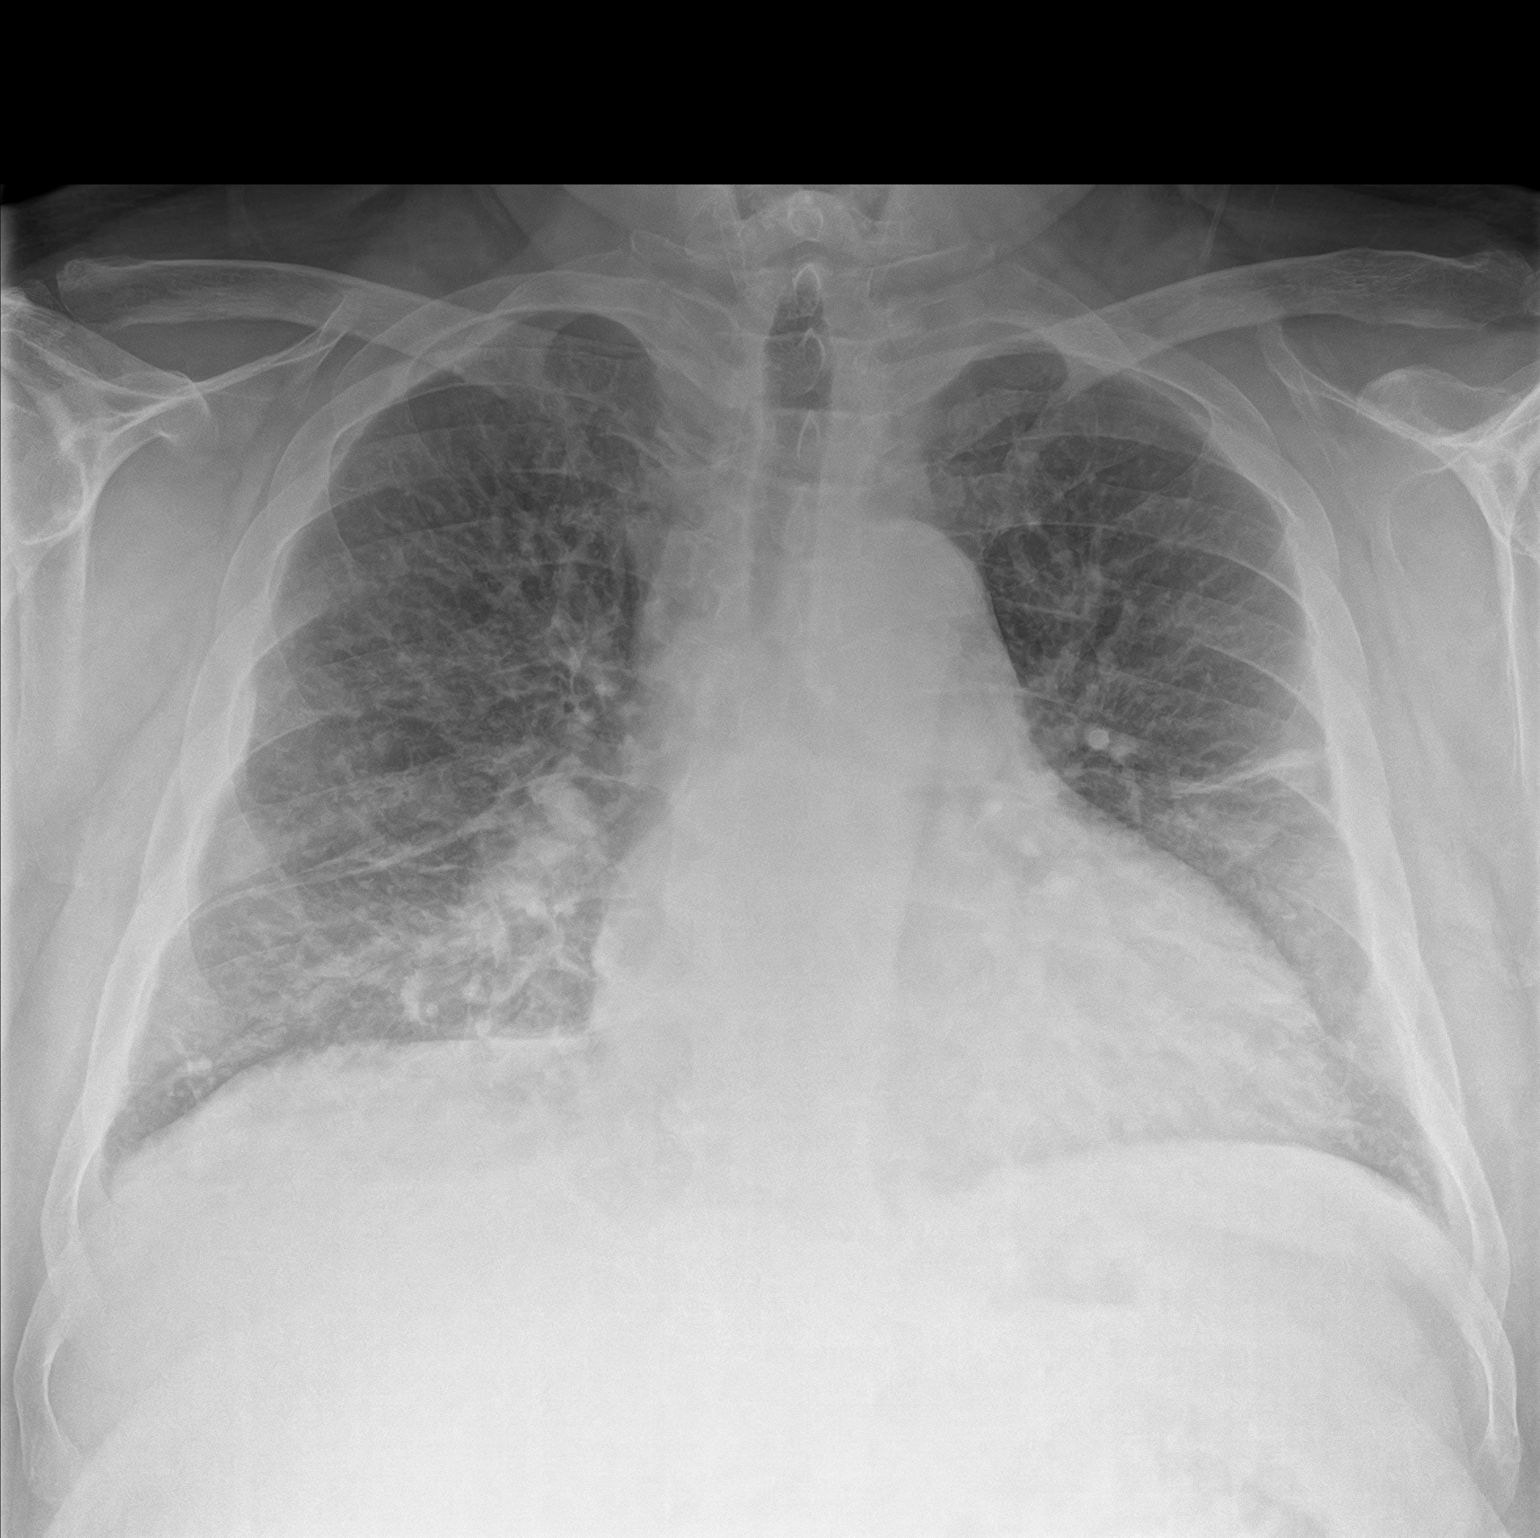
[im 2/2]
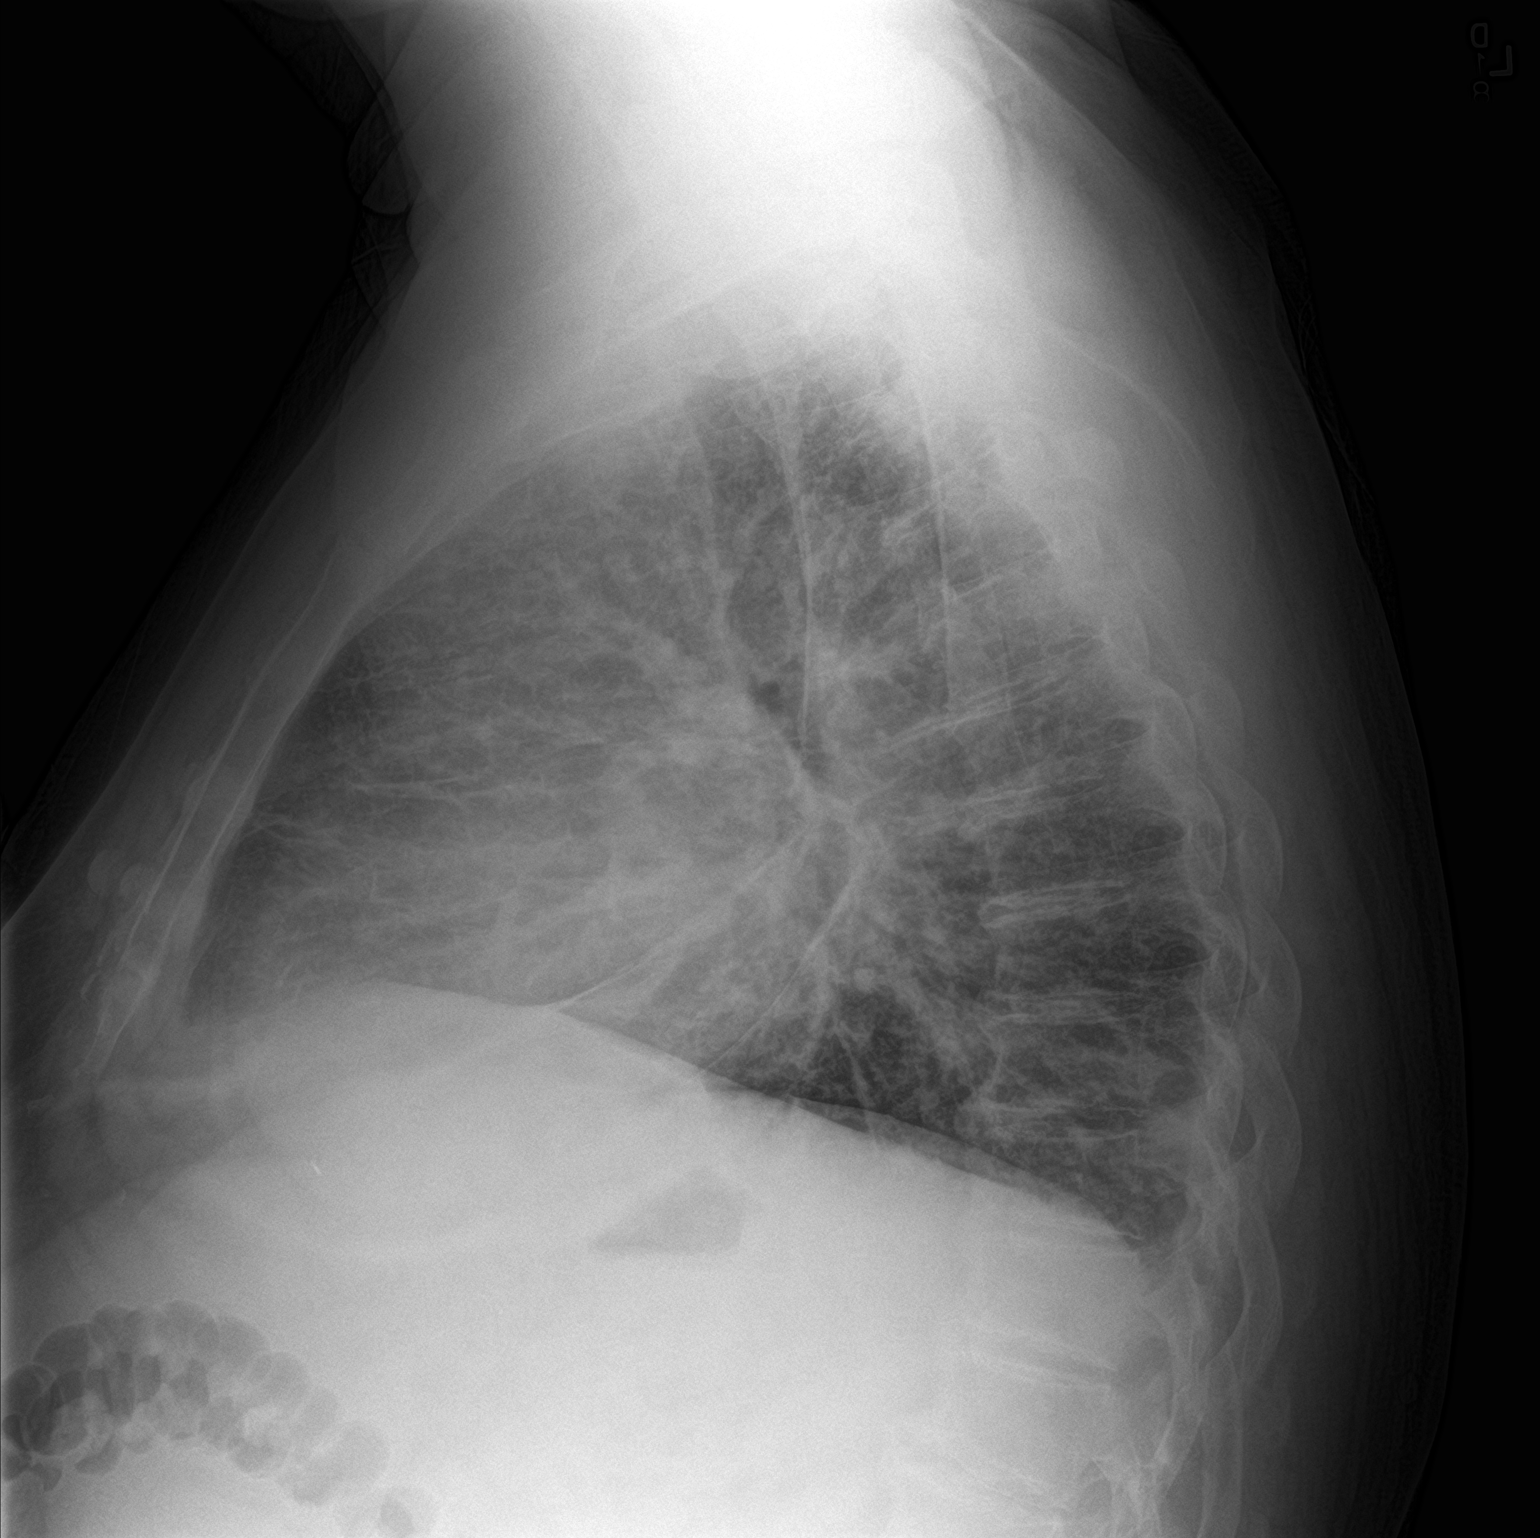

[2 of 2 positions shown; findings below may reference images not displayed]

FINDINGS: Cardiac enlargement. Mild vascular congestion has progressed in the
interval.

Progression of mild bibasilar airspace disease. Minimal pleural
fluid bilaterally unchanged.
IMPRESSION: Cardiac enlargement with vascular congestion which shows mild
progression. Small bilateral effusions.

Mild bibasilar atelectasis has progressed.

## 2021-11-05 ENCOUNTER — Encounter: Payer: Self-pay | Admitting: Family Medicine

## 2021-11-05 ENCOUNTER — Other Ambulatory Visit: Payer: Self-pay | Admitting: Family Medicine

## 2021-11-05 ENCOUNTER — Other Ambulatory Visit: Payer: Self-pay

## 2021-11-05 ENCOUNTER — Ambulatory Visit (INDEPENDENT_AMBULATORY_CARE_PROVIDER_SITE_OTHER): Payer: BC Managed Care – PPO | Admitting: Family Medicine

## 2021-11-05 VITALS — BP 131/83 | HR 81 | Temp 98.4°F | Resp 14 | Ht 72.0 in | Wt 246.0 lb

## 2021-11-05 DIAGNOSIS — N529 Male erectile dysfunction, unspecified: Secondary | ICD-10-CM | POA: Diagnosis not present

## 2021-11-05 DIAGNOSIS — I5023 Acute on chronic systolic (congestive) heart failure: Secondary | ICD-10-CM | POA: Diagnosis not present

## 2021-11-05 DIAGNOSIS — Z125 Encounter for screening for malignant neoplasm of prostate: Secondary | ICD-10-CM

## 2021-11-05 DIAGNOSIS — E1165 Type 2 diabetes mellitus with hyperglycemia: Secondary | ICD-10-CM

## 2021-11-05 DIAGNOSIS — E1169 Type 2 diabetes mellitus with other specified complication: Secondary | ICD-10-CM | POA: Diagnosis not present

## 2021-11-05 DIAGNOSIS — I5022 Chronic systolic (congestive) heart failure: Secondary | ICD-10-CM

## 2021-11-05 DIAGNOSIS — I25118 Atherosclerotic heart disease of native coronary artery with other forms of angina pectoris: Secondary | ICD-10-CM | POA: Diagnosis not present

## 2021-11-05 DIAGNOSIS — Z794 Long term (current) use of insulin: Secondary | ICD-10-CM | POA: Diagnosis not present

## 2021-11-05 DIAGNOSIS — Z0000 Encounter for general adult medical examination without abnormal findings: Secondary | ICD-10-CM | POA: Diagnosis not present

## 2021-11-05 DIAGNOSIS — E785 Hyperlipidemia, unspecified: Secondary | ICD-10-CM

## 2021-11-05 DIAGNOSIS — R7989 Other specified abnormal findings of blood chemistry: Secondary | ICD-10-CM

## 2021-11-05 MED ORDER — EZETIMIBE 10 MG PO TABS: 10.0000 mg | ORAL_TABLET | Freq: Every day | ORAL | 1 refills | Status: DC

## 2021-11-05 MED ORDER — CARVEDILOL 6.25 MG PO TABS: 6.2500 mg | ORAL_TABLET | Freq: Two times a day (BID) | ORAL | 1 refills | Status: DC

## 2021-11-05 NOTE — Assessment & Plan Note (Signed)
Chronic, labile continue ?F/b cards ?Multiple hospitalizations for poor fluid status ?Does not follow low fat, low sodium diet ?

## 2021-11-05 NOTE — Assessment & Plan Note (Signed)
Chronic, previously stable ?Associated with CAD ?LDL goal <70 emphasized ?We recommend diet low in saturated fat and regular exercise - 30 min at least 5 times per week ?On crestor 20 mg and Zetia/Tricor 10/145 mg ?

## 2021-11-05 NOTE — Assessment & Plan Note (Addendum)
Chronic, previous unstable on both fast/long, lispro/glargine 28u/carb based dosing, acting insulins ?Has not gotten his CGM d/t insurance ?Will repeat A1c today ?Will refer back to CCM to assist as pt has PA in place for use of trulicity  ? ?

## 2021-11-05 NOTE — Progress Notes (Signed)
? ? ? ?Complete physical exam ? ?I,Gabriel [name],acting as a scribe for [name], FNP.,have documented all relevant documentation on the behalf of [name], FNP,as directed by  [name], FNP while in the presence of [name], FNP. ? ? ?Patient: Gabriel [name].   DOB: 03-04-1971   51 y.o. Male  MRN: [id number] ?Visit Date: 11/05/2021 ? ?Today's healthcare provider: [name], FNP  ?Re Introduced to nurse practitioner role and practice setting.  All questions answered.  Discussed provider/patient relationship and expectations. ? ? ?Chief Complaint  ?Patient presents with  ? Annual Exam  ? ?Subjective  ?  ?Gabriel [name]. is a 51 y.o. male who presents today for a complete physical exam.  ?He reports consuming a general diet. The patient does not participate in regular exercise at present. He generally feels fairly well. He reports sleeping fairly well. He does have additional problems to discuss today.  ?HPI  ? ? ?Past Medical History:  ?Diagnosis Date  ? CAD (coronary artery disease)   ? a. 04/2015 low risk MV;  b. 12/2016 Cath: minor irregs in LAD/Diag/LCX/OM, RCA 40p/m/d; c. 05/2020 Cath: LM nl, LAD 50d, LCX 30p, OM1 40, RCA 100p w/ L->L collats. CO/CI 3.1/1.3-->Med rx.  ? Chest wall pain, chronic   ? CHF (congestive heart failure) ([place])   ? Chronic Troponin Elevation   ? CKD (chronic kidney disease), stage II-III   ? COPD (chronic obstructive pulmonary disease) ([place])   ? Diabetes mellitus without complication ([place])   ? HFrEF (heart failure with reduced ejection fraction) ([place])   ? Hypertension   ? Mitral regurgitation   ? Mild to moderate by October 2021 echocardiogram.  ? Mixed Ischemic & NICM (nonischemic cardiomyopathy) ([place])   ? a. 03/2015 Echo: EF 45-50%; b. 12/2015 Echo: EF 20-25%; c. 02/2016 Echo: EF 30-35%; d. 11/2016 Echo: EF 40-45%; e. 06/2019 Echo: EF 30-35%; f. 11/2019 Echo: EF 25-30%; g. 05/2020 Echo: EF 35-40%, glob HK, nl RV size/fxn, PASP 44.65mHg, mod dil LA. Mild to mod MR.   ? Myocardial infarct ([facility]   ? NSVT (nonsustained ventricular tachycardia)   ? a. 12/2015 noted on tele-->amio;  b. 12/2015 Event monitor: no VT noted.  ? Obesity (BMI 30.0-34.9)   ? Psoriasis   ? Recurrent pulmonary emboli ([facility] 06/07/2020  ? 06/07/20: small bilateral PEs.  12/31/19: RUL and RLL PEs.  ? Syncope   ? a. 01/2016 - felt to be vasovagal.  ? ?Past Surgical History:  ?Procedure Laterality Date  ? AMPUTATION    ? CARDIAC CATHETERIZATION    ? FINGER AMPUTATION    ? Traumatic  ? FINGER FRACTURE SURGERY Left   ? LEFT HEART CATH AND CORONARY ANGIOGRAPHY N/A 01/06/2017  ? Procedure: Left Heart Cath and Coronary Angiography;  Surgeon: AWellington Hampshire MD;  [facility];  Service: Cardiovascular;  Laterality: N/A;  ? RIGHT/LEFT HEART CATH AND CORONARY ANGIOGRAPHY N/A 06/13/2020  ? Procedure: RIGHT/LEFT HEART CATH AND CORONARY ANGIOGRAPHY;  Surgeon: ENelva Bush MD;  [facility];  Service: Cardiovascular;  Laterality: N/A;  ? ?Social History  ? ?Socioeconomic History  ? Marital status: Widowed  ?  Spouse name: Not on file  ? Number of children: 1  ? Years of education: 110 ? Highest education level: 12th grade  ?Occupational History  ? Occupation: disabled  ?Tobacco Use  ? Smoking status: Former  ?  Packs/day: 0.50  ?  Years:  33.00  ?  Pack years: 16.50  ?  Types: Cigarettes  ?  Quit date: 05/08/2020  ?  Years since quitting: 1.4  ? Smokeless tobacco: Former  ?  Quit date: 05/08/2020  ? Tobacco comments:  ?  Quit Sept 2021  ?Vaping Use  ? Vaping Use: Former  ?Substance and Sexual Activity  ? Alcohol use: Yes  ?  Alcohol/week: 0.0 standard drinks  ?  Comment: occassionally  ? Drug use: No  ? Sexual activity: Not Currently  ?Other Topics Concern  ? Not on file  ?Social History Narrative  ? Lives at home with wife and his dad's wife.    ?   ? 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   ? ?Social Determinants of Health   ? ?Financial Resource Strain: Not on file  ?Food Insecurity: Not on file  ?Transportation Needs: Not on file  ?Physical Activity: Not on file  ?Stress: Not on file  ?Social Connections: Not on file  ?Intimate Partner Violence: Not on file  ? ?Family Status  ?Relation Name Status  ? Mother  Deceased  ? Father  Alive  ? MGF  Deceased  ? Ethlyn Daniels  Deceased  ? ?Family History  ?Problem Relation Age of Onset  ? Diabetes Mother   ? Diabetes Mellitus II Mother   ? Hypothyroidism Mother   ? Hypertension Mother   ? Kidney failure Mother   ?     Dialysis  ? Heart attack Mother   ?     25 yo approximately  ? Hypertension Father   ? Gout Father   ? Cancer Maternal Grandfather   ? Diabetes Maternal Grandfather   ? Cancer Paternal Aunt   ? ?Allergies  ?Allergen Reactions  ? Metformin And Related Nausea And Vomiting  ? Prednisone Other (See Comments)  ?  Reaction: Hallucinations  ?  ? Zantac [Ranitidine Hcl] Diarrhea and Nausea Only  ?  Night sweats  ?  ?Patient Care Team: ?[name], FNP as PCP - General (Family Medicine) ?Minna Merritts, MD as PCP - Cardiology (Cardiology) ?Alisa Graff, FNP as Nurse Practitioner (Family Medicine) ?Minna Merritts, MD as Consulting Physician (Cardiology) ?Germaine Pomfret, [facility] (Pharmacist)  ? ?Medications: ?Outpatient Medications Prior to Visit  ?Medication Sig  ? Continuous Blood Gluc Receiver (FREESTYLE LIBRE 2 READER) DEVI Use to monitor blood sugars as directed  ? Continuous Blood Gluc Sensor (FREESTYLE LIBRE 2 SENSOR) MISC Use to monitor blood sugars as directed  ? Dulaglutide (TRULICITY) 1.5 GY/1.8HU SOPN Inject 1.5 mg into the skin once a week.  ? fenofibrate (TRICOR) 145 MG tablet Take 1 tablet (145 mg total) by mouth daily.  ? Insulin Glargine (BASAGLAR KWIKPEN) 100 UNIT/ML Inject 28 Units into the skin at bedtime.  ? insulin lispro (HUMALOG) 100 UNIT/ML KwikPen Inject 3 Units into the skin 3 (three) times daily.  ? Insulin Pen Needle (PEN NEEDLES) 32G X 4 MM MISC 200  each by Does not apply route at bedtime.  ? Ipratropium-Albuterol (COMBIVENT RESPIMAT) 20-100 MCG/ACT AERS respimat Inhale 1 puff into the lungs every 6 (six) hours as needed for wheezing.  ? metolazone (ZAROXOLYN) 5 MG tablet TAKE 1 TABLET (5 MG TOTAL) BY MOUTH DAILY AS NEEDED (FOR WEIGHT OVER 245).  ? nitroGLYCERIN (NITROSTAT) 0.4 MG SL tablet Place 1 tablet (0.4 mg total) under the tongue every 5 (five) minutes x 3 doses as needed for chest  pain.  ? potassium chloride SA (KLOR-CON M20) 20 MEQ tablet Take 1 tablet daily  ? rivaroxaban (XARELTO) 20 MG TABS tablet Take 1 tablet (20 mg total) by mouth daily with supper.  ? rosuvastatin (CRESTOR) 20 MG tablet Take 1 tablet (20 mg total) by mouth daily.  ? sacubitril-valsartan (ENTRESTO) 24-26 MG Take 1 tablet by mouth 2 (two) times daily.  ? torsemide 60 MG TABS Take 60 mg by mouth daily.  ? [DISCONTINUED] carvedilol (COREG) 6.25 MG tablet Take 1 tablet (6.25 mg total) by mouth 2 (two) times daily with a meal. NEED FOLLOW UP APPOINTMENT FOR ANYMORE REFILLS  ? [DISCONTINUED] ezetimibe (ZETIA) 10 MG tablet Take 10 mg by mouth daily.  ? ?No facility-administered medications prior to visit.  ? ? ?Review of Systems  ?Constitutional:  Positive for fatigue.  ?HENT:  Positive for drooling, nosebleeds and rhinorrhea.   ?Eyes:  Positive for photophobia and discharge.  ?Cardiovascular:  Positive for chest pain.  ?Gastrointestinal:  Positive for abdominal distention.  ?Musculoskeletal:  Positive for arthralgias, back pain, gait problem and neck stiffness.  ?Psychiatric/Behavioral:  The patient is hyperactive.   ?All other systems reviewed and are negative. ? ?Last CBC ?Lab Results  ?Component Value Date  ? WBC 8.5 09/27/2021  ? HGB 14.9 09/27/2021  ? HCT 44.2 09/27/2021  ? MCV 84.0 09/27/2021  ? MCH 28.3 09/27/2021  ? RDW 14.0 09/27/2021  ? PLT 147 (L) 09/27/2021  ? ?Last metabolic panel ?Lab Results  ?Component Value Date  ? GLUCOSE 289 (H) 10/29/2021  ? GLUCOSE 294 (H)  10/29/2021  ? NA 136 10/29/2021  ? NA 137 10/29/2021  ? K 3.7 10/29/2021  ? K 3.7 10/29/2021  ? CL 95 (L) 10/29/2021  ? CL 97 (L) 10/29/2021  ? CO2 30 10/29/2021  ? CO2 31 10/29/2021  ? BUN 37 (H) 10/29/2021

## 2021-11-05 NOTE — Assessment & Plan Note (Signed)
Chronic, labile d/t use of diuretic  ?Repeat CMP today ?Previously had BMP at 7-10 days ago by cards given use of IV diuretic for elevated weight ?

## 2021-11-05 NOTE — Assessment & Plan Note (Signed)
Check PSA today ?DRE deferred ? ?

## 2021-11-05 NOTE — Assessment & Plan Note (Signed)
New insurance, will call to get cards ?Due for dental ?Due for vision ?Things to do to keep yourself healthy  ?- Exercise at least 30-45 minutes a day, 3-4 days a week.  ?- Eat a low-fat diet with lots of fruits and vegetables, up to 7-9 servings per day.  ?- Seatbelts can save your life. Wear them always.  ?- Smoke detectors on every level of your home, check batteries every year.  ?- Eye Doctor - have an eye exam every 1-2 years  ?- Safe sex - if you may be exposed to STDs, use a condom.  ?- Alcohol -  If you drink, do it moderately, less than 2 drinks per day.  ?- [place]. Choose someone to speak for you if you are not able.  ?- Depression is common in our stressful world.If you're feeling down or losing interest in things you normally enjoy, please come in for a visit.  ?- Violence - If anyone is threatening or hurting you, please call immediately. ? ? ?

## 2021-11-05 NOTE — Assessment & Plan Note (Signed)
New complaint; chronic, worsening over past 4/5 years ?Denies ability to obtain morning erection ?Will refer to urology given complicated hx of HFrEF and DM ?

## 2021-11-05 NOTE — Assessment & Plan Note (Signed)
Chronic, labile ?Recent hospitalization for poor fluid status ?emphasize importance of use of water and low sodium/low fat diet and slow steady weight/fat reduction ?F/b cards  ?

## 2021-11-06 LAB — CBC WITH DIFFERENTIAL/PLATELET
Basophils Absolute: 0.1 10*3/uL (ref 0.0–0.2)
Basos: 1 %
EOS (ABSOLUTE): 0.1 10*3/uL (ref 0.0–0.4)
Eos: 1 %
Hematocrit: 51.9 % — ABNORMAL HIGH (ref 37.5–51.0)
Hemoglobin: 17 g/dL (ref 13.0–17.7)
Immature Grans (Abs): 0.1 10*3/uL (ref 0.0–0.1)
Immature Granulocytes: 1 %
Lymphocytes Absolute: 1.8 10*3/uL (ref 0.7–3.1)
Lymphs: 18 %
MCH: 28.2 pg (ref 26.6–33.0)
MCHC: 32.8 g/dL (ref 31.5–35.7)
MCV: 86 fL (ref 79–97)
Monocytes Absolute: 0.6 10*3/uL (ref 0.1–0.9)
Monocytes: 6 %
Neutrophils Absolute: 7.1 10*3/uL — ABNORMAL HIGH (ref 1.4–7.0)
Neutrophils: 73 %
Platelets: 170 10*3/uL (ref 150–450)
RBC: 6.03 x10E6/uL — ABNORMAL HIGH (ref 4.14–5.80)
RDW: 14.3 % (ref 11.6–15.4)
WBC: 9.6 10*3/uL (ref 3.4–10.8)

## 2021-11-06 LAB — COMPREHENSIVE METABOLIC PANEL
ALT: 16 IU/L (ref 0–44)
AST: 17 IU/L (ref 0–40)
Albumin/Globulin Ratio: 1.6 (ref 1.2–2.2)
Albumin: 4.6 g/dL (ref 3.8–4.9)
Alkaline Phosphatase: 70 IU/L (ref 44–121)
BUN/Creatinine Ratio: 13 (ref 9–20)
BUN: 18 mg/dL (ref 6–24)
Bilirubin Total: 1.7 mg/dL — ABNORMAL HIGH (ref 0.0–1.2)
CO2: 29 mmol/L (ref 20–29)
Calcium: 10.1 mg/dL (ref 8.7–10.2)
Chloride: 98 mmol/L (ref 96–106)
Creatinine, Ser: 1.38 mg/dL — ABNORMAL HIGH (ref 0.76–1.27)
Globulin, Total: 2.8 g/dL (ref 1.5–4.5)
Glucose: 354 mg/dL — ABNORMAL HIGH (ref 70–99)
Potassium: 4.9 mmol/L (ref 3.5–5.2)
Sodium: 142 mmol/L (ref 134–144)
Total Protein: 7.4 g/dL (ref 6.0–8.5)
eGFR: 62 mL/min/{1.73_m2} (ref >59)

## 2021-11-06 LAB — LIPID PANEL
Chol/HDL Ratio: 5.6 ratio — ABNORMAL HIGH (ref 0.0–5.0)
Cholesterol, Total: 157 mg/dL (ref 100–199)
HDL: 28 mg/dL — ABNORMAL LOW (ref >39)
LDL Chol Calc (NIH): 56 mg/dL (ref 0–99)
Triglycerides: 489 mg/dL — ABNORMAL HIGH (ref 0–149)
VLDL Cholesterol Cal: 73 mg/dL — ABNORMAL HIGH (ref 5–40)

## 2021-11-06 LAB — TSH+FREE T4
Free T4: 1.57 ng/dL (ref 0.82–1.77)
TSH: 1.27 u[IU]/mL (ref 0.450–4.500)

## 2021-11-06 LAB — HEMOGLOBIN A1C
Est. average glucose Bld gHb Est-mCnc: 237 mg/dL
Hgb A1c MFr Bld: 9.9 % — ABNORMAL HIGH (ref 4.8–5.6)

## 2021-11-06 LAB — PSA: Prostate Specific Ag, Serum: 1.5 ng/mL (ref 0.0–4.0)

## 2021-11-08 ENCOUNTER — Telehealth: Payer: Self-pay

## 2021-11-08 NOTE — Progress Notes (Signed)
Chronic Care Management  APPOINTMENT REMINDER ? ? ?Dalan Cowger. was reminded to have all medications, supplements and any blood glucose,  Bring Freestyle Libre Sensor + Meter and blood pressure readings available for review with Junius Argyle, Pharm. D, at his office visit on 11/09/2021 at 8:30 am . ? ?Patient states he needs to reschedule his appointment because he was called in to work on 11/09/2021. ? ?Office follow up appointment with Care management team member scheduled for : 11/23/2021 at 8:30 am. ? ?Anderson Malta ?Clinical Pharmacist Assistant ?[phone]  ? ? ?

## 2021-11-09 ENCOUNTER — Ambulatory Visit: Payer: BC Managed Care – PPO

## 2021-11-19 ENCOUNTER — Ambulatory Visit (INDEPENDENT_AMBULATORY_CARE_PROVIDER_SITE_OTHER): Payer: BC Managed Care – PPO | Admitting: Urology

## 2021-11-19 ENCOUNTER — Encounter: Payer: Self-pay | Admitting: Urology

## 2021-11-19 VITALS — BP 150/97 | HR 83 | Ht 72.0 in | Wt 243.0 lb

## 2021-11-19 DIAGNOSIS — N5201 Erectile dysfunction due to arterial insufficiency: Secondary | ICD-10-CM | POA: Diagnosis not present

## 2021-11-19 MED ORDER — SILDENAFIL CITRATE 100 MG PO TABS: ORAL_TABLET | ORAL | 0 refills | Status: DC

## 2021-11-19 NOTE — Progress Notes (Signed)
? ?11/19/2021 ?8:34 AM  ? ?Gabriel [name]. ?March 17, 1971 ?[id number] ? ?Referring provider: [name], FNP ?[address] ?[place],  [place] [address] ? ?Chief Complaint  ?Patient presents with  ? Erectile Dysfunction  ? ? ?HPI: ?51 y.o. male presents for follow-up of erectile dysfunction. ? ?I initially saw July 2021 ?He elected a vacuum erection device which he states was not effective as the erection was not maintained by the compression band ?I did reach out to [name] who thought he would be fine for a PDE 5 inhibitor trial which he has not tried ?He does have sublingual nitroglycerin but rarely takes ? ?PMH: ?Past Medical History:  ?Diagnosis Date  ? CAD (coronary artery disease)   ? a. 04/2015 low risk MV;  b. 12/2016 Cath: minor irregs in LAD/Diag/LCX/OM, RCA 40p/m/d; c. 05/2020 Cath: LM nl, LAD 50d, LCX 30p, OM1 40, RCA 100p w/ L->L collats. CO/CI 3.1/1.3-->Med rx.  ? Chest wall pain, chronic   ? CHF (congestive heart failure) ([place])   ? Chronic Troponin Elevation   ? CKD (chronic kidney disease), stage II-III   ? COPD (chronic obstructive pulmonary disease) ([place])   ? Diabetes mellitus without complication ([place])   ? HFrEF (heart failure with reduced ejection fraction) ([place])   ? Hypertension   ? Mitral regurgitation   ? Mild to moderate by October 2021 echocardiogram.  ? Mixed Ischemic & NICM (nonischemic cardiomyopathy) ([place])   ? a. 03/2015 Echo: EF 45-50%; b. 12/2015 Echo: EF 20-25%; c. 02/2016 Echo: EF 30-35%; d. 11/2016 Echo: EF 40-45%; e. 06/2019 Echo: EF 30-35%; f. 11/2019 Echo: EF 25-30%; g. 05/2020 Echo: EF 35-40%, glob HK, nl RV size/fxn, PASP 44.6mHg, mod dil LA. Mild to mod MR.  ? [facility]ocardial infar[facility] ([facility]   ? NSVT (nonsustained ventricular tachycardia) ([facility]   ? a. 12/2015 noted on tele-->amio;  b. 12/2015 Event monitor: no VT noted.  ? Obesity (BMI 30.0-34.9)   ? Psoriasis   ? Recurrent pulmonary emboli ([facility] 06/07/2020  ? 06/07/20: small bilateral PEs.  12/31/19: RUL and RLL PEs.  ? Syncope   ?  a. 01/2016 - felt to be vasovagal.  ? ? ?Surgical History: ?Past Surgical History:  ?Procedure Laterality Date  ? AMPUTATION    ? CARDIAC CATHETERIZATION    ? FINGER AMPUTATION    ? Traumatic  ? FINGER FRACTURE SURGERY Left   ? LEFT HEART CATH AND CORONARY ANGIOGRAPHY N/A 01/06/2017  ? Procedure: Left Heart Cath and Coronary Angiography;  Surgeon: AWellington Hampshire MD;  [facility];  Service: Cardiovascular;  Laterality: N/A;  ? RIGHT/LEFT HEART CATH AND CORONARY ANGIOGRAPHY N/A 06/13/2020  ? Procedure: RIGHT/LEFT HEART CATH AND CORONARY ANGIOGRAPHY;  Surgeon: ENelva Bush MD;  [facility];  Service: Cardiovascular;  Laterality: N/A;  ? ? ?Home Medications:  ?Allergies as of 11/19/2021   ? ?   Reactions  ? Metformin And Related Nausea And Vomiting  ? Prednisone Other (See Comments)  ? Reaction: Hallucinations   ? Zantac [ranitidine Hcl] Diarrhea, Nausea Only  ? Night sweats  ? ?  ? ?  ?Medication List  ?  ? ?  ? Accurate as of November 19, 2021  8:34 AM. If you have any questions, ask your nurse or doctor.  ?  ?  ? ?  ? ?Basaglar KwikPen 100 UNIT/ML ?Inject 28 Units into the skin at bedtime. ?  ?carvedilol 6.25 MG tablet ?Commonly known as: COREG ?Take 1 tablet (6.25  mg total) by mouth 2 (two) times daily with a meal. ?  ?Combivent Respimat 20-100 MCG/ACT Aers respimat ?Generic drug: Ipratropium-Albuterol ?Inhale 1 puff into the lungs every 6 (six) hours as needed for wheezing. ?  ?ezetimibe 10 MG tablet ?Commonly known as: ZETIA ?Take 1 tablet (10 mg total) by mouth daily. ?  ?fenofibrate 145 MG tablet ?Commonly known as: TRICOR ?Take 1 tablet (145 mg total) by mouth daily. ?  ?FreeStyle [place] 2 Reader Kerrin Mo ?Use to monitor blood sugars as directed ?  ?FreeStyle Libre 2 Sensor Misc ?Use to monitor blood sugars as directed ?  ?insulin lispro 100 UNIT/ML KwikPen ?Commonly known as: HUMALOG ?Inject 3 Units into the skin 3 (three) times daily. ?  ?metolazone 5 MG tablet ?Commonly  known as: ZAROXOLYN ?TAKE 1 TABLET (5 MG TOTAL) BY MOUTH DAILY AS NEEDED (FOR WEIGHT OVER 245). ?  ?nitroGLYCERIN 0.4 MG SL tablet ?Commonly known as: NITROSTAT ?Place 1 tablet (0.4 mg total) under the tongue every 5 (five) minutes x 3 doses as needed for chest pain. ?  ?Pen Needles 32G X 4 MM Misc ?200 each by Does not apply route at bedtime. ?  ?potassium chloride SA 20 MEQ tablet ?Commonly known as: [place]-Con M20 ?Take 1 tablet daily ?  ?rivaroxaban 20 MG Tabs tablet ?Commonly known as: XARELTO ?Take 1 tablet (20 mg total) by mouth daily with supper. ?  ?rosuvastatin 20 MG tablet ?Commonly known as: CRESTOR ?Take 1 tablet (20 mg total) by mouth daily. ?  ?sacubitril-valsartan 24-26 MG ?Commonly known as: ENTRESTO ?Take 1 tablet by mouth 2 (two) times daily. ?  ?sildenafil 100 MG tablet ?Commonly known as: VIAGRA ?Take 1 tablets 1 hour prior to intercourse. ?Started by: Abbie Sons, MD ?  ?Torsemide 60 MG Tabs ?Take 60 mg by mouth daily. ?  ?Trulicity 1.5 NW/2.9FA Sopn ?Generic drug: Dulaglutide ?Inject 1.5 mg into the skin once a week. ?  ? ?  ? ? ?Allergies:  ?Allergies  ?Allergen Reactions  ? Metformin And Related Nausea And Vomiting  ? Prednisone Other (See Comments)  ?  Reaction: Hallucinations  ?  ? Zantac [Ranitidine Hcl] Diarrhea and Nausea Only  ?  Night sweats  ? ? ?Family History: ?Family History  ?Problem Relation Age of Onset  ? Diabetes Mother   ? Diabetes Mellitus II Mother   ? Hypothyroidism Mother   ? Hypertension Mother   ? Kidney failure Mother   ?     Dialysis  ? Heart attack Mother   ?     34 yo approximately  ? Hypertension Father   ? Gout Father   ? Cancer Maternal Grandfather   ? Diabetes Maternal Grandfather   ? Cancer Paternal Aunt   ? ? ?Social History:  reports that he quit smoking about 18 months ago. His smoking use included cigarettes. He has a 16.50 pack-year smoking history. He quit smokeless tobacco use about 18 months ago. He reports current alcohol use. He reports that he  does not use drugs. ? ? ?Physical Exam: ?BP (!) 150/97   Pulse 83   Ht 6' (1.829 m)   Wt 243 lb (110.2 kg)   BMI 32.96 kg/m?   ?Constitutional:  Alert and oriented, No acute distress. ?HEENT: [place] AT, moist mucus membranes.  Trachea midline, no masses. ?Respiratory: Normal respiratory effort, no increased work of breathing. ?Psychiatric: Normal mood and affect. ? ? ?Assessment & Plan:   ? ?1.  Erectile dysfunction ?Desires a PDE 5 inhibitor trial ?  Rx sildenafil sent to pharmacy.  Recommended he try on at least 5 occasions before determining failure ?It was stressed he should not take nitroglycerin if he has had sildenafil within 24 hours due to the possibility of marked hypotension ?If not effective he was provided literature on intracavernosal injection therapy ? ? ?Abbie Sons, MD ? ?[facility] ?[address] ?[place], [place] [address] ?[phone] ? ?

## 2021-11-23 ENCOUNTER — Ambulatory Visit: Payer: BC Managed Care – PPO

## 2021-11-24 ENCOUNTER — Inpatient Hospital Stay: Payer: BC Managed Care – PPO

## 2021-11-24 ENCOUNTER — Other Ambulatory Visit: Payer: Self-pay

## 2021-11-24 ENCOUNTER — Emergency Department: Payer: BC Managed Care – PPO

## 2021-11-24 ENCOUNTER — Inpatient Hospital Stay
Admission: EM | Admit: 2021-11-24 | Discharge: 2021-11-28 | DRG: 291 | Disposition: A | Discharge status: Home / Self Care | Payer: BC Managed Care – PPO | Attending: Internal Medicine | Admitting: Internal Medicine

## 2021-11-24 ENCOUNTER — Inpatient Hospital Stay (HOSPITAL_COMMUNITY)
Admit: 2021-11-24 | Discharge: 2021-11-24 | Disposition: A | Discharge status: Home / Self Care | Payer: BC Managed Care – PPO | Attending: Internal Medicine | Admitting: Internal Medicine

## 2021-11-24 ENCOUNTER — Encounter: Payer: Self-pay | Admitting: Internal Medicine

## 2021-11-24 DIAGNOSIS — E1122 Type 2 diabetes mellitus with diabetic chronic kidney disease: Secondary | ICD-10-CM | POA: Diagnosis not present

## 2021-11-24 DIAGNOSIS — R079 Chest pain, unspecified: Secondary | ICD-10-CM | POA: Diagnosis not present

## 2021-11-24 DIAGNOSIS — Z86711 Personal history of pulmonary embolism: Secondary | ICD-10-CM | POA: Diagnosis not present

## 2021-11-24 DIAGNOSIS — N183 Chronic kidney disease, stage 3 unspecified: Secondary | ICD-10-CM | POA: Diagnosis present

## 2021-11-24 DIAGNOSIS — Z794 Long term (current) use of insulin: Secondary | ICD-10-CM

## 2021-11-24 DIAGNOSIS — R7989 Other specified abnormal findings of blood chemistry: Secondary | ICD-10-CM | POA: Diagnosis not present

## 2021-11-24 DIAGNOSIS — E669 Obesity, unspecified: Secondary | ICD-10-CM | POA: Diagnosis present

## 2021-11-24 DIAGNOSIS — I252 Old myocardial infarction: Secondary | ICD-10-CM | POA: Diagnosis not present

## 2021-11-24 DIAGNOSIS — Z833 Family history of diabetes mellitus: Secondary | ICD-10-CM

## 2021-11-24 DIAGNOSIS — K819 Cholecystitis, unspecified: Secondary | ICD-10-CM | POA: Diagnosis present

## 2021-11-24 DIAGNOSIS — I472 Ventricular tachycardia, unspecified: Secondary | ICD-10-CM | POA: Diagnosis not present

## 2021-11-24 DIAGNOSIS — Z87891 Personal history of nicotine dependence: Secondary | ICD-10-CM | POA: Diagnosis not present

## 2021-11-24 DIAGNOSIS — I083 Combined rheumatic disorders of mitral, aortic and tricuspid valves: Secondary | ICD-10-CM | POA: Diagnosis not present

## 2021-11-24 DIAGNOSIS — J90 Pleural effusion, not elsewhere classified: Secondary | ICD-10-CM | POA: Diagnosis not present

## 2021-11-24 DIAGNOSIS — I248 Other forms of acute ischemic heart disease: Secondary | ICD-10-CM | POA: Diagnosis not present

## 2021-11-24 DIAGNOSIS — Z20822 Contact with and (suspected) exposure to covid-19: Secondary | ICD-10-CM | POA: Diagnosis not present

## 2021-11-24 DIAGNOSIS — I10 Essential (primary) hypertension: Secondary | ICD-10-CM | POA: Diagnosis present

## 2021-11-24 DIAGNOSIS — A419 Sepsis, unspecified organism: Secondary | ICD-10-CM

## 2021-11-24 DIAGNOSIS — Z888 Allergy status to other drugs, medicaments and biological substances status: Secondary | ICD-10-CM

## 2021-11-24 DIAGNOSIS — N1831 Chronic kidney disease, stage 3a: Secondary | ICD-10-CM | POA: Diagnosis present

## 2021-11-24 DIAGNOSIS — K7460 Unspecified cirrhosis of liver: Secondary | ICD-10-CM | POA: Diagnosis not present

## 2021-11-24 DIAGNOSIS — N179 Acute kidney failure, unspecified: Secondary | ICD-10-CM | POA: Diagnosis not present

## 2021-11-24 DIAGNOSIS — Z79899 Other long term (current) drug therapy: Secondary | ICD-10-CM

## 2021-11-24 DIAGNOSIS — R1011 Right upper quadrant pain: Secondary | ICD-10-CM | POA: Diagnosis not present

## 2021-11-24 DIAGNOSIS — J189 Pneumonia, unspecified organism: Secondary | ICD-10-CM

## 2021-11-24 DIAGNOSIS — I5023 Acute on chronic systolic (congestive) heart failure: Secondary | ICD-10-CM | POA: Diagnosis not present

## 2021-11-24 DIAGNOSIS — R778 Other specified abnormalities of plasma proteins: Secondary | ICD-10-CM | POA: Diagnosis present

## 2021-11-24 DIAGNOSIS — E782 Mixed hyperlipidemia: Secondary | ICD-10-CM | POA: Diagnosis present

## 2021-11-24 DIAGNOSIS — I428 Other cardiomyopathies: Secondary | ICD-10-CM | POA: Diagnosis not present

## 2021-11-24 DIAGNOSIS — Z9989 Dependence on other enabling machines and devices: Secondary | ICD-10-CM

## 2021-11-24 DIAGNOSIS — I25118 Atherosclerotic heart disease of native coronary artery with other forms of angina pectoris: Secondary | ICD-10-CM | POA: Diagnosis not present

## 2021-11-24 DIAGNOSIS — K529 Noninfective gastroenteritis and colitis, unspecified: Secondary | ICD-10-CM

## 2021-11-24 DIAGNOSIS — I2699 Other pulmonary embolism without acute cor pulmonale: Secondary | ICD-10-CM | POA: Insufficient documentation

## 2021-11-24 DIAGNOSIS — G4733 Obstructive sleep apnea (adult) (pediatric): Secondary | ICD-10-CM

## 2021-11-24 DIAGNOSIS — R0602 Shortness of breath: Secondary | ICD-10-CM | POA: Diagnosis not present

## 2021-11-24 DIAGNOSIS — I5043 Acute on chronic combined systolic (congestive) and diastolic (congestive) heart failure: Secondary | ICD-10-CM | POA: Diagnosis present

## 2021-11-24 DIAGNOSIS — I2510 Atherosclerotic heart disease of native coronary artery without angina pectoris: Secondary | ICD-10-CM | POA: Diagnosis present

## 2021-11-24 DIAGNOSIS — I2489 Other forms of acute ischemic heart disease: Secondary | ICD-10-CM

## 2021-11-24 DIAGNOSIS — R109 Unspecified abdominal pain: Secondary | ICD-10-CM | POA: Diagnosis not present

## 2021-11-24 DIAGNOSIS — I110 Hypertensive heart disease with heart failure: Secondary | ICD-10-CM | POA: Diagnosis not present

## 2021-11-24 DIAGNOSIS — I255 Ischemic cardiomyopathy: Secondary | ICD-10-CM | POA: Diagnosis present

## 2021-11-24 DIAGNOSIS — Z7901 Long term (current) use of anticoagulants: Secondary | ICD-10-CM

## 2021-11-24 DIAGNOSIS — Z8249 Family history of ischemic heart disease and other diseases of the circulatory system: Secondary | ICD-10-CM

## 2021-11-24 DIAGNOSIS — R6510 Systemic inflammatory response syndrome (SIRS) of non-infectious origin without acute organ dysfunction: Secondary | ICD-10-CM | POA: Diagnosis not present

## 2021-11-24 DIAGNOSIS — I509 Heart failure, unspecified: Secondary | ICD-10-CM | POA: Diagnosis not present

## 2021-11-24 DIAGNOSIS — Z7985 Long-term (current) use of injectable non-insulin antidiabetic drugs: Secondary | ICD-10-CM

## 2021-11-24 DIAGNOSIS — I5021 Acute systolic (congestive) heart failure: Secondary | ICD-10-CM | POA: Diagnosis not present

## 2021-11-24 DIAGNOSIS — I5082 Biventricular heart failure: Secondary | ICD-10-CM | POA: Diagnosis not present

## 2021-11-24 DIAGNOSIS — Z6833 Body mass index (BMI) 33.0-33.9, adult: Secondary | ICD-10-CM

## 2021-11-24 DIAGNOSIS — J449 Chronic obstructive pulmonary disease, unspecified: Secondary | ICD-10-CM | POA: Diagnosis not present

## 2021-11-24 DIAGNOSIS — E1129 Type 2 diabetes mellitus with other diabetic kidney complication: Secondary | ICD-10-CM | POA: Diagnosis present

## 2021-11-24 DIAGNOSIS — I130 Hypertensive heart and chronic kidney disease with heart failure and stage 1 through stage 4 chronic kidney disease, or unspecified chronic kidney disease: Principal | ICD-10-CM | POA: Diagnosis present

## 2021-11-24 DIAGNOSIS — K760 Fatty (change of) liver, not elsewhere classified: Secondary | ICD-10-CM | POA: Diagnosis not present

## 2021-11-24 DIAGNOSIS — G8929 Other chronic pain: Secondary | ICD-10-CM | POA: Diagnosis present

## 2021-11-24 DIAGNOSIS — E1169 Type 2 diabetes mellitus with other specified complication: Secondary | ICD-10-CM

## 2021-11-24 LAB — CBC WITH DIFFERENTIAL/PLATELET
Abs Immature Granulocytes: 0.08 10*3/uL — ABNORMAL HIGH (ref 0.00–0.07)
Basophils Absolute: 0.1 10*3/uL (ref 0.0–0.1)
Basophils Relative: 1 %
Eosinophils Absolute: 0 10*3/uL (ref 0.0–0.5)
Eosinophils Relative: 0 %
HCT: 53.8 % — ABNORMAL HIGH (ref 39.0–52.0)
Hemoglobin: 17.1 g/dL — ABNORMAL HIGH (ref 13.0–17.0)
Immature Granulocytes: 1 %
Lymphocytes Relative: 17 %
Lymphs Abs: 2.2 10*3/uL (ref 0.7–4.0)
MCH: 27.2 pg (ref 26.0–34.0)
MCHC: 31.8 g/dL (ref 30.0–36.0)
MCV: 85.7 fL (ref 80.0–100.0)
Monocytes Absolute: 1 10*3/uL (ref 0.1–1.0)
Monocytes Relative: 8 %
Neutro Abs: 9.2 10*3/uL — ABNORMAL HIGH (ref 1.7–7.7)
Neutrophils Relative %: 73 %
Platelets: 244 10*3/uL (ref 150–400)
RBC: 6.28 MIL/uL — ABNORMAL HIGH (ref 4.22–5.81)
RDW: 15.4 % (ref 11.5–15.5)
WBC: 12.5 10*3/uL — ABNORMAL HIGH (ref 4.0–10.5)
nRBC: 0 % (ref 0.0–0.2)

## 2021-11-24 LAB — ECHOCARDIOGRAM COMPLETE
Area-P 1/2: 5.79 cm2
Height: 72 in
S' Lateral: 5.3 cm
Weight: 3920 oz

## 2021-11-24 LAB — RESP PANEL BY RT-PCR (FLU A&B, COVID) ARPGX2
Influenza A by PCR: NEGATIVE
Influenza B by PCR: NEGATIVE
SARS Coronavirus 2 by RT PCR: NEGATIVE

## 2021-11-24 LAB — TROPONIN I (HIGH SENSITIVITY)
Troponin I (High Sensitivity): 50 ng/L — ABNORMAL HIGH (ref <18)
Troponin I (High Sensitivity): 55 ng/L — ABNORMAL HIGH (ref <18)
Troponin I (High Sensitivity): 59 ng/L — ABNORMAL HIGH (ref <18)

## 2021-11-24 LAB — PROTIME-INR
INR: 1.3 — ABNORMAL HIGH (ref 0.8–1.2)
Prothrombin Time: 15.7 seconds — ABNORMAL HIGH (ref 11.4–15.2)

## 2021-11-24 LAB — COMPREHENSIVE METABOLIC PANEL
ALT: 262 U/L — ABNORMAL HIGH (ref 0–44)
AST: 265 U/L — ABNORMAL HIGH (ref 15–41)
Albumin: 3.8 g/dL (ref 3.5–5.0)
Alkaline Phosphatase: 63 U/L (ref 38–126)
Anion gap: 11 (ref 5–15)
BUN: 29 mg/dL — ABNORMAL HIGH (ref 6–20)
CO2: 20 mmol/L — ABNORMAL LOW (ref 22–32)
Calcium: 9 mg/dL (ref 8.9–10.3)
Chloride: 106 mmol/L (ref 98–111)
Creatinine, Ser: 1.55 mg/dL — ABNORMAL HIGH (ref 0.61–1.24)
GFR, Estimated: 54 mL/min — ABNORMAL LOW (ref >60)
Glucose, Bld: 228 mg/dL — ABNORMAL HIGH (ref 70–99)
Potassium: 4.5 mmol/L (ref 3.5–5.1)
Sodium: 137 mmol/L (ref 135–145)
Total Bilirubin: 3.5 mg/dL — ABNORMAL HIGH (ref 0.3–1.2)
Total Protein: 7.1 g/dL (ref 6.5–8.1)

## 2021-11-24 LAB — HEPATITIS PANEL, ACUTE
HCV Ab: NONREACTIVE
Hep A IgM: NONREACTIVE
Hep B C IgM: NONREACTIVE
Hepatitis B Surface Ag: NONREACTIVE

## 2021-11-24 LAB — APTT
aPTT: 29 seconds (ref 24–36)
aPTT: 78 seconds — ABNORMAL HIGH (ref 24–36)
aPTT: 88 seconds — ABNORMAL HIGH (ref 24–36)

## 2021-11-24 LAB — BRAIN NATRIURETIC PEPTIDE: B Natriuretic Peptide: 1654.7 pg/mL — ABNORMAL HIGH (ref 0.0–100.0)

## 2021-11-24 LAB — HEPARIN LEVEL (UNFRACTIONATED): Heparin Unfractionated: 0.18 IU/mL — ABNORMAL LOW (ref 0.30–0.70)

## 2021-11-24 LAB — LIPASE, BLOOD: Lipase: 28 U/L (ref 11–51)

## 2021-11-24 LAB — LACTIC ACID, PLASMA
Lactic Acid, Venous: 2.1 mmol/L (ref 0.5–1.9)
Lactic Acid, Venous: 2.3 mmol/L (ref 0.5–1.9)

## 2021-11-24 LAB — CBG MONITORING, ED
Glucose-Capillary: 230 mg/dL — ABNORMAL HIGH (ref 70–99)
Glucose-Capillary: 163 mg/dL — ABNORMAL HIGH (ref 70–99)

## 2021-11-24 LAB — GLUCOSE, CAPILLARY
Glucose-Capillary: 113 mg/dL — ABNORMAL HIGH (ref 70–99)
Glucose-Capillary: 241 mg/dL — ABNORMAL HIGH (ref 70–99)

## 2021-11-24 LAB — HIV ANTIBODY (ROUTINE TESTING W REFLEX): HIV Screen 4th Generation wRfx: NONREACTIVE

## 2021-11-24 LAB — HEMOGLOBIN A1C
Hgb A1c MFr Bld: 8.7 % — ABNORMAL HIGH (ref 4.8–5.6)
Mean Plasma Glucose: 202.99 mg/dL

## 2021-11-24 LAB — PROCALCITONIN: Procalcitonin: 0.21 ng/mL

## 2021-11-24 MED ORDER — FENOFIBRATE 160 MG PO TABS
160.0000 mg | ORAL_TABLET | Freq: Every day | ORAL | Status: DC
  Administered 2021-11-25 – 2021-11-28 (×4): 160 mg via ORAL
  Filled 2021-11-24 (×6): qty 1

## 2021-11-24 MED ORDER — FUROSEMIDE 10 MG/ML IJ SOLN
60.0000 mg | Freq: Two times a day (BID) | INTRAMUSCULAR | Status: DC
  Administered 2021-11-24 – 2021-11-27 (×5): 60 mg via INTRAVENOUS
  Filled 2021-11-24 (×6): qty 6

## 2021-11-24 MED ORDER — HYDRALAZINE HCL 20 MG/ML IJ SOLN: 5.0000 mg | INTRAMUSCULAR | Status: DC | PRN

## 2021-11-24 MED ORDER — HEPARIN (PORCINE) 25000 UT/250ML-% IV SOLN
2200.0000 [IU]/h | INTRAVENOUS | Status: DC
  Administered 2021-11-24 – 2021-11-27 (×7): 2200 [IU]/h via INTRAVENOUS
  Filled 2021-11-24 (×7): qty 250

## 2021-11-24 MED ORDER — SODIUM CHLORIDE 0.9 % IV SOLN
2.0000 g | INTRAVENOUS | Status: DC
  Administered 2021-11-25: 2 g via INTRAVENOUS
  Filled 2021-11-24: qty 20

## 2021-11-24 MED ORDER — HYDROMORPHONE HCL 1 MG/ML IJ SOLN
1.0000 mg | INTRAMUSCULAR | Status: DC | PRN
  Administered 2021-11-24 – 2021-11-25 (×2): 1 mg via INTRAVENOUS
  Filled 2021-11-24 (×3): qty 1

## 2021-11-24 MED ORDER — INSULIN ASPART 100 UNIT/ML IJ SOLN
0.0000 [IU] | Freq: Three times a day (TID) | INTRAMUSCULAR | Status: DC
  Administered 2021-11-24: 2 [IU] via SUBCUTANEOUS
  Administered 2021-11-24: 3 [IU] via SUBCUTANEOUS
  Administered 2021-11-25: 1 [IU] via SUBCUTANEOUS
  Administered 2021-11-25: 2 [IU] via SUBCUTANEOUS
  Administered 2021-11-25 – 2021-11-26 (×3): 1 [IU] via SUBCUTANEOUS
  Administered 2021-11-26 – 2021-11-27 (×2): 2 [IU] via SUBCUTANEOUS
  Administered 2021-11-27 (×2): 1 [IU] via SUBCUTANEOUS
  Administered 2021-11-28: 2 [IU] via SUBCUTANEOUS
  Filled 2021-11-24 (×12): qty 1

## 2021-11-24 MED ORDER — INSULIN ASPART 100 UNIT/ML IJ SOLN
0.0000 [IU] | Freq: Every day | INTRAMUSCULAR | Status: DC
  Administered 2021-11-24 – 2021-11-27 (×3): 2 [IU] via SUBCUTANEOUS
  Filled 2021-11-24 (×3): qty 1

## 2021-11-24 MED ORDER — MORPHINE SULFATE (PF) 2 MG/ML IV SOLN
2.0000 mg | INTRAVENOUS | Status: DC | PRN
Start: 2021-11-24 — End: 2021-11-24

## 2021-11-24 MED ORDER — SODIUM CHLORIDE 0.9 % IV SOLN
500.0000 mg | Freq: Once | INTRAVENOUS | Status: AC
  Administered 2021-11-24: 500 mg via INTRAVENOUS
  Filled 2021-11-24: qty 5

## 2021-11-24 MED ORDER — PIPERACILLIN-TAZOBACTAM 3.375 G IVPB
3.3750 g | Freq: Three times a day (TID) | INTRAVENOUS | Status: DC
  Administered 2021-11-24: 3.375 g via INTRAVENOUS
  Filled 2021-11-24: qty 50

## 2021-11-24 MED ORDER — ALBUTEROL SULFATE (2.5 MG/3ML) 0.083% IN NEBU
3.0000 mL | INHALATION_SOLUTION | RESPIRATORY_TRACT | Status: DC | PRN
  Administered 2021-11-26: 3 mL via RESPIRATORY_TRACT

## 2021-11-24 MED ORDER — FENTANYL CITRATE PF 50 MCG/ML IJ SOSY
50.0000 ug | PREFILLED_SYRINGE | Freq: Once | INTRAMUSCULAR | Status: AC
  Administered 2021-11-24: 50 ug via INTRAVENOUS
  Filled 2021-11-24: qty 1

## 2021-11-24 MED ORDER — SODIUM CHLORIDE 0.9 % IV SOLN
1.0000 g | Freq: Once | INTRAVENOUS | Status: AC
  Administered 2021-11-24: 1 g via INTRAVENOUS
  Filled 2021-11-24: qty 10

## 2021-11-24 MED ORDER — ONDANSETRON HCL 4 MG/2ML IJ SOLN: 4.0000 mg | Freq: Three times a day (TID) | INTRAMUSCULAR | Status: DC | PRN

## 2021-11-24 MED ORDER — FUROSEMIDE 10 MG/ML IJ SOLN
60.0000 mg | Freq: Once | INTRAMUSCULAR | Status: AC
  Administered 2021-11-24: 60 mg via INTRAVENOUS
  Filled 2021-11-24: qty 8

## 2021-11-24 MED ORDER — EZETIMIBE 10 MG PO TABS
10.0000 mg | ORAL_TABLET | Freq: Every day | ORAL | Status: DC
  Administered 2021-11-24 – 2021-11-28 (×5): 10 mg via ORAL
  Filled 2021-11-24 (×5): qty 1

## 2021-11-24 MED ORDER — ROSUVASTATIN CALCIUM 10 MG PO TABS
20.0000 mg | ORAL_TABLET | Freq: Every day | ORAL | Status: DC
  Administered 2021-11-24 – 2021-11-27 (×4): 20 mg via ORAL
  Filled 2021-11-24 (×4): qty 2

## 2021-11-24 MED ORDER — SODIUM CHLORIDE 0.9 % IV SOLN
500.0000 mg | INTRAVENOUS | Status: DC
  Administered 2021-11-25: 500 mg via INTRAVENOUS
  Filled 2021-11-24: qty 5

## 2021-11-24 MED ORDER — PERFLUTREN LIPID MICROSPHERE
1.0000 mL | INTRAVENOUS | Status: AC | PRN
  Administered 2021-11-24: 3 mL via INTRAVENOUS
  Filled 2021-11-24: qty 10

## 2021-11-24 MED ORDER — IPRATROPIUM-ALBUTEROL 0.5-2.5 (3) MG/3ML IN SOLN
3.0000 mL | Freq: Four times a day (QID) | RESPIRATORY_TRACT | Status: DC
  Administered 2021-11-24 – 2021-11-25 (×4): 3 mL via RESPIRATORY_TRACT
  Filled 2021-11-24 (×6): qty 3

## 2021-11-24 MED ORDER — CARVEDILOL 6.25 MG PO TABS
6.2500 mg | ORAL_TABLET | Freq: Two times a day (BID) | ORAL | Status: DC
  Administered 2021-11-24 – 2021-11-28 (×8): 6.25 mg via ORAL
  Filled 2021-11-24 (×8): qty 1

## 2021-11-24 MED ORDER — TECHNETIUM TC 99M MEBROFENIN IV KIT
5.0000 | PACK | Freq: Once | INTRAVENOUS | Status: AC | PRN
  Administered 2021-11-24: 5.46 via INTRAVENOUS

## 2021-11-24 MED ORDER — POTASSIUM CHLORIDE CRYS ER 20 MEQ PO TBCR
20.0000 meq | EXTENDED_RELEASE_TABLET | Freq: Two times a day (BID) | ORAL | Status: DC
  Administered 2021-11-24 – 2021-11-28 (×9): 20 meq via ORAL
  Filled 2021-11-24 (×9): qty 1

## 2021-11-24 MED ORDER — SACUBITRIL-VALSARTAN 24-26 MG PO TABS
1.0000 | ORAL_TABLET | Freq: Two times a day (BID) | ORAL | Status: DC
  Filled 2021-11-24: qty 1

## 2021-11-24 MED ORDER — IOHEXOL 350 MG/ML SOLN
75.0000 mL | Freq: Once | INTRAVENOUS | Status: AC | PRN
  Administered 2021-11-24: 75 mL via INTRAVENOUS

## 2021-11-24 MED ORDER — FUROSEMIDE 10 MG/ML IJ SOLN: 40.0000 mg | Freq: Two times a day (BID) | INTRAMUSCULAR | Status: DC

## 2021-11-24 MED ORDER — INSULIN GLARGINE-YFGN 100 UNIT/ML ~~LOC~~ SOLN
20.0000 [IU] | Freq: Every day | SUBCUTANEOUS | Status: DC
  Administered 2021-11-24 – 2021-11-27 (×4): 20 [IU] via SUBCUTANEOUS
  Filled 2021-11-24 (×5): qty 0.2

## 2021-11-24 MED ORDER — SACUBITRIL-VALSARTAN 49-51 MG PO TABS
1.0000 | ORAL_TABLET | Freq: Two times a day (BID) | ORAL | Status: DC
  Administered 2021-11-24 – 2021-11-28 (×9): 1 via ORAL
  Filled 2021-11-24 (×10): qty 1

## 2021-11-24 MED ORDER — NITROGLYCERIN 0.4 MG SL SUBL
0.4000 mg | SUBLINGUAL_TABLET | SUBLINGUAL | Status: DC | PRN
  Administered 2021-11-24: 0.4 mg via SUBLINGUAL
  Filled 2021-11-24: qty 1

## 2021-11-24 MED ORDER — DM-GUAIFENESIN ER 30-600 MG PO TB12: 1.0000 | ORAL_TABLET | Freq: Two times a day (BID) | ORAL | Status: DC | PRN

## 2021-11-24 MED ORDER — METRONIDAZOLE 500 MG/100ML IV SOLN
500.0000 mg | Freq: Once | INTRAVENOUS | Status: DC
  Filled 2021-11-24: qty 100

## 2021-11-24 NOTE — ED Notes (Signed)
Dr Blaine Hamper on way to see pt again. ?

## 2021-11-24 NOTE — Progress Notes (Signed)
ANTICOAGULATION CONSULT NOTE ? ?Pharmacy Consult for heparin initiation & monitoring ?Indication: h/o pulmonary embolus ? ?Patient Measurements: ?Height: 6' (182.9 cm) ?Weight: 111.1 kg (245 lb) ?IBW/kg (Calculated) : 77.6 ?Heparin Dosing Weight: 102 kg ? ?Vital Signs: ?Temp: 97.6 ?F (36.4 ?C) (04/08 1938) ?Temp Source: Oral (04/08 1556) ?BP: 133/80 (04/08 1938) ?Pulse Rate: 79 (04/08 2043) ? ?Labs: ?Recent Labs  ?  11/24/21 ?0630 11/24/21 ?1601 11/24/21 ?0932 11/24/21 ?3557 11/24/21 ?0930 11/24/21 ?1557 11/24/21 ?2131  ?HGB 17.1*  --   --   --   --   --   --   ?HCT 53.8*  --   --   --   --   --   --   ?PLT 244  --   --   --   --   --   --   ?APTT  --   --   --  29  --  78* 88*  ?LABPROT  --   --   --  15.7*  --   --   --   ?INR  --   --   --  1.3*  --   --   --   ?HEPARINUNFRC  --   --  0.18*  --   --   --   --   ?CREATININE 1.55*  --   --   --   --   --   --   ?TROPONINIHS 50* 55*  --   --  59*  --   --   ? ? ? ?Estimated Creatinine Clearance: 71.8 mL/min (A) (by C-G formula based on SCr of 1.55 mg/dL (H)). ? ? ?Medical History: ?Past Medical History:  ?Diagnosis Date  ? CAD (coronary artery disease)   ? a. 04/2015 low risk MV;  b. 12/2016 Cath: minor irregs in LAD/Diag/LCX/OM, RCA 40p/m/d; c. 05/2020 Cath: LM nl, LAD 50d, LCX 30p, OM1 40, RCA 100p w/ L->L collats. CO/CI 3.1/1.3-->Med rx.  ? Chest wall pain, chronic   ? CHF (congestive heart failure) ([place])   ? Chronic Troponin Elevation   ? CKD (chronic kidney disease), stage II-III   ? COPD (chronic obstructive pulmonary disease) ([place])   ? Diabetes mellitus without complication ([place])   ? HFrEF (heart failure with reduced ejection fraction) ([place])   ? Hypertension   ? Mitral regurgitation   ? Mild to moderate by October 2021 echocardiogram.  ? Mixed Ischemic & NICM (nonischemic cardiomyopathy) ([place])   ? a. 03/2015 Echo: EF 45-50%; b. 12/2015 Echo: EF 20-25%; c. 02/2016 Echo: EF 30-35%; d. 11/2016 Echo: EF 40-45%; e. 06/2019 Echo: EF 30-35%; f. 11/2019 Echo: EF 25-30%; g.  05/2020 Echo: EF 35-40%, glob HK, nl RV size/fxn, PASP 44.34mHg, mod dil LA. Mild to mod MR.  ? Myocardial infarct ([facility]   ? NSVT (nonsustained ventricular tachycardia) ([facility]   ? a. 12/2015 noted on tele-->amio;  b. 12/2015 Event monitor: no VT noted.  ? Obesity (BMI 30.0-34.9)   ? Psoriasis   ? Recurrent pulmonary emboli ([facility] 06/07/2020  ? 06/07/20: small bilateral PEs.  12/31/19: RUL and RLL PEs.  ? Syncope   ? a. 01/2016 - felt to be vasovagal.  ? ? ?Medications: rivaroxaban 20 mg once daily (last dose last night 11/23/21) ? ?Assessment: Pharmacy consulted for heparin infusion dosing and monitoring for 51yo male admitted with SOB.  He has PMH of CHF, CKD, COPD, DM, HTN, and PE. ? ?Goal of Therapy:  ?Heparin level 0.3-0.7 units/ml ?aPTT 66 - 102  seconds ?Monitor platelets by anticoagulation protocol: Yes ? ?Date Time aPTT/HL Rate/Comment ?4/08 0810 29/ 0.18 Hep 2200 units/hr ?4/08 1557 78/---  Therapeutic x1  ?4/08 2131 88/--  Therapeutic x 2 ? ?Plan:  ?Continue heparin drip at 2200 units/hr.  ?Check aPTT daily wi/ AM labs ?Check anti-xa level with AM labs ?Titrate by aPTT's until lab correlation is noted, then titrate by anti-xa alone. ?Continue to monitor H&H and platelets daily while on heparin gtt. ? ?Thank you for allowing pharmacy to be a part of this patient?s care. ? ?Renda Rolls, PharmD, MBA ?11/24/2021 ?10:52 PM ? ? ? ? ? ?

## 2021-11-24 NOTE — ED Notes (Signed)
CBG 230 

## 2021-11-24 NOTE — ED Triage Notes (Signed)
Presents to ER with SOB, abd pain, bilateral leg pain since Wed. States increased trulicity on Wed and symptoms started within the hour. Hx CHF on torsemide. Urinary pain this AM. Denies increase in leg swelling, but has muscle cramping to legs. On xarelto with hx DVT/PE. Tachypneic in triage.  ?

## 2021-11-24 NOTE — Progress Notes (Signed)
ANTICOAGULATION CONSULT NOTE ? ?Pharmacy Consult for heparin initiation & monitoring ?Indication: h/o pulmonary embolus ? ?Patient Measurements: ?Height: 6' (182.9 cm) ?Weight: 111.1 kg (245 lb) ?IBW/kg (Calculated) : 77.6 ?Heparin Dosing Weight: 102 kg ? ?Vital Signs: ?Temp: 98 ?F (36.7 ?C) (04/08 1454) ?Temp Source: Axillary (04/08 1454) ?BP: 152/103 (04/08 1517) ?Pulse Rate: 89 (04/08 1517) ? ?Labs: ?Recent Labs  ?  11/24/21 ?3710 11/24/21 ?6269 11/24/21 ?4854 11/24/21 ?6270 11/24/21 ?0930 11/24/21 ?1557  ?HGB 17.1*  --   --   --   --   --   ?HCT 53.8*  --   --   --   --   --   ?PLT 244  --   --   --   --   --   ?APTT  --   --   --  29  --  78*  ?LABPROT  --   --   --  15.7*  --   --   ?INR  --   --   --  1.3*  --   --   ?HEPARINUNFRC  --   --  0.18*  --   --   --   ?CREATININE 1.55*  --   --   --   --   --   ?TROPONINIHS 50* 55*  --   --  59*  --   ? ? ? ?Estimated Creatinine Clearance: 71.8 mL/min (A) (by C-G formula based on SCr of 1.55 mg/dL (H)). ? ? ?Medical History: ?Past Medical History:  ?Diagnosis Date  ? CAD (coronary artery disease)   ? a. 04/2015 low risk MV;  b. 12/2016 Cath: minor irregs in LAD/Diag/LCX/OM, RCA 40p/m/d; c. 05/2020 Cath: LM nl, LAD 50d, LCX 30p, OM1 40, RCA 100p w/ L->L collats. CO/CI 3.1/1.3-->Med rx.  ? Chest wall pain, chronic   ? CHF (congestive heart failure) ([place])   ? Chronic Troponin Elevation   ? CKD (chronic kidney disease), stage II-III   ? COPD (chronic obstructive pulmonary disease) ([place])   ? Diabetes mellitus without complication ([place])   ? HFrEF (heart failure with reduced ejection fraction) ([place])   ? Hypertension   ? Mitral regurgitation   ? Mild to moderate by October 2021 echocardiogram.  ? Mixed Ischemic & NICM (nonischemic cardiomyopathy) ([place])   ? a. 03/2015 Echo: EF 45-50%; b. 12/2015 Echo: EF 20-25%; c. 02/2016 Echo: EF 30-35%; d. 11/2016 Echo: EF 40-45%; e. 06/2019 Echo: EF 30-35%; f. 11/2019 Echo: EF 25-30%; g. 05/2020 Echo: EF 35-40%, glob HK, nl RV size/fxn, PASP  44.27mHg, mod dil LA. Mild to mod MR.  ? Myocardial infarct ([facility]   ? NSVT (nonsustained ventricular tachycardia) ([place]   ? a. 12/2015 noted on tele-->amio;  b. 12/2015 Event monitor: no VT noted.  ? Obesity (BMI 30.0-34.9)   ? Psoriasis   ? Recurrent pulmonary emboli ([facility] 06/07/2020  ? 06/07/20: small bilateral PEs.  12/31/19: RUL and RLL PEs.  ? Syncope   ? a. 01/2016 - felt to be vasovagal.  ? ? ?Medications: rivaroxaban 20 mg once daily (last dose last night 11/23/21) ? ?Assessment: Pharmacy consulted for heparin infusion dosing and monitoring for 51yo male admitted with SOB.  He has PMH of CHF, CKD, COPD, DM, HTN, and PE. ? ?Goal of Therapy:  ?Heparin level 0.3-0.7 units/ml ?aPTT 66 - 102 seconds ?Monitor platelets by anticoagulation protocol: Yes ? ?Date Time aPTT/HL Rate/Comment ?4/08 0810 29/ 0.18 Hep 2200 units/hr ?4/08 1557 78/---  Therapeutic x1 - Continue  ? ?  Plan:  ?Continue heparin drip at 2200 units/hr.  ?Check aPTT level in 6 hours ?Check anti-xa level with AM labs ?Titrate by aPTT's until lab correlation is noted, then titrate by anti-xa alone. ?Continue to monitor H&H and platelets daily while on heparin gtt. ? ?Thank you for allowing pharmacy to be a part of this patient?s care. ? ?Darrick Penna, PharmD, MS PGPM ?Clinical Pharmacist ?11/24/2021 ?4:22 PM ? ? ? ?

## 2021-11-24 NOTE — ED Notes (Signed)
Pt stated that CP was unchanged and sharp and declined further NTG at this time. Before going to CT. ?

## 2021-11-24 NOTE — ED Notes (Signed)
Dr Peyton Najjar from surgery at bedside evaluating pt. ?

## 2021-11-24 NOTE — Progress Notes (Signed)
Pharmacy Antibiotic Note ? ?Cutler Sunday. is a 51 y.o. male w/ PMH of CHF, CKD, COPD, DM, HTN, and PE admitted on 11/24/2021 with SOB and abdominal pain.  Pharmacy has been consulted for Zosyn dosing in a case of acalculous cholecystitis. CXR also shows possible PNA. ? ?Plan: start Zosyn 3.375g IV q8h (4 hour infusion). ? ?Height: 6' (182.9 cm) ?Weight: 111.1 kg (245 lb) ?IBW/kg (Calculated) : 77.6 ? ?Temp (24hrs), Avg:97.7 ?F (36.5 ?C), Min:97.7 ?F (36.5 ?C), Max:97.7 ?F (36.5 ?C) ? ?Recent Labs  ?Lab 11/24/21 ?2725 11/24/21 ?0701  ?WBC 12.5*  --   ?CREATININE 1.55*  --   ?LATICACIDVEN  --  2.1*  ?  ?Estimated Creatinine Clearance: 71.8 mL/min (A) (by C-G formula based on SCr of 1.55 mg/dL (H)).   ? ?Allergies  ?Allergen Reactions  ? Metformin And Related Nausea And Vomiting  ? Prednisone Other (See Comments)  ?  Reaction: Hallucinations  ?  ? Zantac [Ranitidine Hcl] Diarrhea and Nausea Only  ?  Night sweats  ? ? ?Antimicrobials this admission: ?04/08 Zosyn >>  ?04/08 azithromycin >>  ? ?Microbiology results: ?04/08 BCx: pending ?04/08 RCx: pending ?04/08 SARS CoV-2: negative ?04/08 influenza A/B: negative ? ?Thank you for allowing pharmacy to be a part of this patient?s care. ? ?Dallie Piles ?11/24/2021 7:44 AM ? ?

## 2021-11-24 NOTE — ED Notes (Signed)
Dr Blaine Hamper informed to come back and see pt. Pt is more SOB with worse chest pain than this morning. ?

## 2021-11-24 NOTE — TOC CM/SW Note (Signed)
CSW acknowledges TOC consult. Please place consults for PT/OT if/when appropriate and TOC will follow up based on their recommendations. ? Oleh Genin, LCSW ?[phone] ? ? ?

## 2021-11-24 NOTE — Progress Notes (Signed)
*  PRELIMINARY RESULTS* ?Echocardiogram ?2D Echocardiogram has been performed. Definity IV image enhancing agent used on this study. ? ?Gabriel [name] ?11/24/2021, 1:13 PM ?

## 2021-11-24 NOTE — ED Notes (Signed)
Blue top drawn before heparin drip initiated for baseline aptt. ?

## 2021-11-24 NOTE — ED Notes (Signed)
Second set of cultures drawn at this time. ?

## 2021-11-24 NOTE — ED Notes (Signed)
Pt still very SOB and tachypneic. Has heparin infusing and abx. Dr Blaine Hamper already saw pt at bedside. Pt still endorses chest pain, states is "hard to breathe" and rates pain as 8/10. Dr Blaine Hamper informed. Pt is NPO for HIDA scan to be performed at about 1300 today. ?

## 2021-11-24 NOTE — ED Notes (Signed)
Pt away at nuclear med currently.  ?

## 2021-11-24 NOTE — ED Notes (Signed)
Pt placed on 3L via [place] as desat to 83% RA when he falls asleep; pt reports used to use a CPAP at night but doc took him off of it. Pt currently 95% on 3L.  ?

## 2021-11-24 NOTE — ED Provider Notes (Signed)
? ?[facility] ?Provider Note ? ? ? Event Date/Time  ? First MD Initiated Contact with Patient 11/24/21 [phone]   ?  (approximate) ? ? ?History  ? ?Shortness of Breath and Leg Pain ? ? ?HPI ? ?Gabriel Sainvil. is a 51 y.o. male who presents to the ED for evaluation of Shortness of Breath and Leg Pain ?  ?I review outpatient heart failure clinic visit on 3/10.  History of obesity, HTN, DM, COPD, CKD and reduced ejection fraction 40%.  CAD ? ?Patient presents to the ED for evaluation of worsening dyspnea, orthopnea and cough.  He reports worsening respiratory symptoms since Wednesday, feeling increasing short of breath, worse when he is laying flat.  Denies fevers or productive cough.  Denies falls, syncope or injuries.  Reports cramping leg pain to his bilateral thighs without any leg weakness or injuries.  Reports an episode of watery diarrhea earlier today.  Reports feeling associated pain to his lower chest/upper abdomen bilaterally, worse when laying flat.  He reports compliance with Xarelto and his torsemide, but despite this has had decreased urinary output over the past 3 to 4 days. ? ?He reports an episode of postprandial epigastric pain on Thursday.  He has had no emesis throughout all of this. ? ?Physical Exam  ? ?Triage Vital Signs: ?ED Triage Vitals  ?Enc Vitals Group  ?   BP 11/24/21 0534 (!) 169/107  ?   Pulse Rate 11/24/21 0534 97  ?   Resp 11/24/21 0534 20  ?   Temp 11/24/21 0534 97.7 ?F (36.5 ?C)  ?   Temp Source 11/24/21 0534 Oral  ?   SpO2 11/24/21 0534 96 %  ?   Weight 11/24/21 0546 245 lb (111.1 kg)  ?   Height 11/24/21 0546 6' (1.829 m)  ?   Head Circumference --   ?   Peak Flow --   ?   Pain Score 11/24/21 0546 9  ?   Pain Loc --   ?   Pain Edu? --   ?   Excl. in [place]? --   ? ? ?Most recent vital signs: ?Vitals:  ? 11/24/21 0630 11/24/21 0700  ?BP: (!) 177/100 (!) 178/107  ?Pulse: (!) 103 (!) 102  ?Resp: (!) 26 (!) 34  ?Temp:    ?SpO2: 97% 93%  ? ? ?General: Awake, no  distress.  ?CV:  Good peripheral perfusion.  ?Resp:  Normal effort.  ?Abd:  No distention.  ?MSK:  No deformity noted.  Minimal edema bilaterally. ?Neuro:  No focal deficits appreciated. ?Other:   ? ? ?ED Results / Procedures / Treatments  ? ?Labs ?(all labs ordered are listed, but only abnormal results are displayed) ?Labs Reviewed  ?COMPREHENSIVE METABOLIC PANEL - Abnormal; Notable for the following components:  ?    Result Value  ? CO2 20 (*)   ? Glucose, Bld 228 (*)   ? BUN 29 (*)   ? Creatinine, Ser 1.55 (*)   ? AST 265 (*)   ? ALT 262 (*)   ? Total Bilirubin 3.5 (*)   ? GFR, Estimated 54 (*)   ? All other components within normal limits  ?CBC WITH DIFFERENTIAL/PLATELET - Abnormal; Notable for the following components:  ? WBC 12.5 (*)   ? RBC 6.28 (*)   ? Hemoglobin 17.1 (*)   ? HCT 53.8 (*)   ? Neutro Abs 9.2 (*)   ? Abs Immature Granulocytes 0.08 (*)   ? All other  components within normal limits  ?BRAIN NATRIURETIC PEPTIDE - Abnormal; Notable for the following components:  ? B Natriuretic Peptide 1,654.7 (*)   ? All other components within normal limits  ?TROPONIN I (HIGH SENSITIVITY) - Abnormal; Notable for the following components:  ? Troponin I (High Sensitivity) 50 (*)   ? All other components within normal limits  ?RESP PANEL BY RT-PCR (FLU A&B, COVID) [id number]  ?CULTURE, BLOOD (SINGLE)  ?PROCALCITONIN  ?LACTIC ACID, PLASMA  ?LACTIC ACID, PLASMA  ? ? ?EKG ?Sinus rhythm, rate of 100 bpm.  Rightward axis.  Incomplete right bundle.  Nonspecific ST changes with subtle ST depressions to inferior leads.Fairly similar to EKG from February ? ?RADIOLOGY ?Plain film of the chest reviewed by me with pulmonary vascular congestion and bibasilar infiltrates versus edema ?RUQ ultrasound reviewed by me with evidence of a calculus cholecystitis ? ?Official radiology report(s): ?DG Chest Portable 1 View ? ?Result Date: 11/24/2021 ?CLINICAL DATA:  CHF with increasing shortness of breath. EXAM: PORTABLE CHEST 1 VIEW COMPARISON:   PA and lateral chest 09/27/2021. FINDINGS: Heart is enlarged. There is perihilar vascular distension and flow cephalization and mild interstitial edema. Minimal pleural effusions are forming. There are bilateral infrahilar opacities which could be due to alveolar edema or pneumonia. Remaining lungs show no focal infiltrates. Linear scar-like opacity chronically seen left mid field. IMPRESSION: Perihilar vascular congestion and mild interstitial edema consistent with CHF or fluid overload. There are bilateral infrahilar opacities which could reflect alveolar edema or pneumonia, and there are minimal pleural effusions. Clinical correlation and radiographic follow-up recommended. There were similar findings on the prior study but the interstitial edema is greater today. Electronically Signed   By: Telford Nab M.D.   On: 11/24/2021 06:28  ? ?US ABDOMEN LIMITED RUQ (LIVER/GB) ? ?Result Date: 11/24/2021 ?CLINICAL DATA:  51 year old male with history of right upper quadrant abdominal pain. EXAM: ULTRASOUND ABDOMEN LIMITED RIGHT UPPER QUADRANT COMPARISON:  No priors. FINDINGS: Gallbladder: No definite shadowing gallstones are noted. Gallbladder does not appear distended. Gallbladder wall is diffusely thickened and edematous measuring up to 13 mm in thickness. Trace volume of pericholecystic fluid. Per report from the sonographer, there was a sonographic Murphy's sign on examination. Common bile duct: Diameter: 5 mm Liver: Liver has a shrunken appearance and nodular contour, indicative of underlying cirrhosis. Diffusely increased hepatic echogenicity, suggestive of hepatic steatosis. No definite suspicious hepatic lesions are confidently identified. Portal vein is patent on color Doppler imaging with normal direction of blood flow towards the liver. Other: None. IMPRESSION: 1. Gallbladder wall is thickened and edematous, with positive sonographic Murphy's sign on examination. No gallstones are noted. Findings could  represent acute acalculous cholecystitis. Surgical consultation is recommended. Correlation with nuclear medicine hepatobiliary scan should be considered if clinically appropriate. 2. Hepatic cirrhosis with evidence of hepatic steatosis. Electronically Signed   By: Vinnie Langton M.D.   On: 11/24/2021 07:08   ? ?PROCEDURES and INTERVENTIONS: ? ?.1-3 Lead EKG Interpretation ?Performed by: Vladimir Crofts, MD ?Authorized by: Vladimir Crofts, MD  ? ?  Interpretation: abnormal   ?  ECG rate:  106 ?  ECG rate assessment: tachycardic   ?  Rhythm: sinus tachycardia   ?  Ectopy: none   ?  Conduction: normal   ?.Critical Care ?Performed by: Vladimir Crofts, MD ?Authorized by: Vladimir Crofts, MD  ? ?Critical care provider statement:  ?  Critical care time (minutes):  30 ?  Critical care time was exclusive of:  Separately billable procedures and treating other patients ?  Critical care was necessary to treat or prevent imminent or life-threatening deterioration of the following conditions:  Sepsis ?  Critical care was time spent personally by me on the following activities:  Development of treatment plan with patient or surrogate, discussions with consultants, evaluation of patient's response to treatment, examination of patient, ordering and review of laboratory studies, ordering and review of radiographic studies, ordering and performing treatments and interventions, pulse oximetry, re-evaluation of patient's condition and review of old charts ? ?Medications  ?azithromycin (ZITHROMAX) 500 mg in sodium chloride 0.9 % 250 mL IVPB (has no administration in time range)  ?metroNIDAZOLE (FLAGYL) IVPB 500 mg (has no administration in time range)  ?furosemide (LASIX) injection 60 mg (60 mg Intravenous Given 11/24/21 0642)  ?cefTRIAXone (ROCEPHIN) 1 g in sodium chloride 0.9 % 100 mL IVPB (1 g Intravenous New Bag/Given 11/24/21 0648)  ?fentaNYL (SUBLIMAZE) injection 50 mcg (50 mcg Intravenous Given 11/24/21 0707)  ? ? ? ?IMPRESSION / MDM /  ASSESSMENT AND PLAN / ED COURSE  ?I reviewed the triage vital signs and the nursing notes. ? ?51 year old male presents to the ED with dyspnea and orthopnea with evidence of a CHF exacerbation and possible Requiring medic

## 2021-11-24 NOTE — ED Notes (Signed)
Dr Niu at bedside 

## 2021-11-24 NOTE — ED Notes (Signed)
Dr Blaine Hamper at bedside examining pt again. ? ?Repeat EKG performed at this time. ? ?Pt states that CP is sharp. ?

## 2021-11-24 NOTE — Consult Note (Addendum)
?Cardiology Consultation:  ? ?Patient ID: Gabriel [name]. ?MRN: [id number]; DOB: 01-Jan-1971 ? ?Admit date: 11/24/2021 ?Date of Consult: 11/24/2021 ? ?PCP:  [name], FNP ?  ?[facility] Providers ?Cardiologist:  [name], MD   { ? ?Patient Profile:  ? ?Gabriel [name]. is a 51 y.o. male with a hx of CAD, chronic chest pain with chronic troponin elevation, HFrEF secondary to nonischemic cardiomyopathy, NSVT, CKD stage III, COPD, chronic anticoagulation secondary to unprovoked PE, DM 2, OSA declined CPAP, obesity who is being seen 11/24/2021 for the evaluation of chest pain at the request of Dr. Blaine Hamper. ? ?History of Present Illness:  ? ?[name] is followed by [name] for the above cardiac issues.  He has multiple hospital admissions over the years for management of HFrEF, chest pain, elevated troponin, COPD.  Prior stress test in 2016 was nonischemic.  Patient was admitted October 2021 with chest pain and elevated troponin above baseline, and underwent right and left heart cath, which demonstrated CTO of the RCA with left-to-right collaterals.  Echo at that time showed EF 35 to 40% with global hypokinesis.  He was medically managed and is also being followed intermittently by the heart failure team in [place]. ? ?12/06/2020, he was admitted to [facility] secondary to near syncope.  He was found to have AKI with a creatinine of 2.44 (baseline 1.2).  Torsemide was reduced to 40 mg daily and he was subsequently discharged but then returned to the ED the following day with weakness and hypotension.  He was subsequently admitted for acute kidney injury, dyspnea, chronic HFrEF.  He was discharged home on Entresto and spironolactone with reduced dose of torsemide.  Admitted again in August 2022 to [facility] with abdominal pain imaging showing concern for calculus cholecystitis.  General surgery initially planned on cholecystectomy however he reconsidered and pursued HIDA scan.  HIDA scan was negative and the cystic  duct was patent, therefore making cholecystectomy unlikely needed.  With this, he left AMA.  He was subsequently seen in the ED at [facility] 2 days later with abdominal pain and was treated for volume overload. ? ?Admitted 10/3-10/5 for chest pain with minimally elevated troponin and a BNP of greater than 2000.  He was treated with IV Lasix for volume overload.  Echo during this admission showed LVEF 40 to 45%, mild LVH, normal diastolic parameters, normal RV function, moderately dilated left atrium, mildly dilated right atrium mild MR. ? ?More recently he was admitted in early February 2023 for acute heart failure.  Lasix was recently increased without improvement of symptoms.  BNP was up to 1000, troponin mildly elevated as well.  Patient was admitted for IV diuresis.  He was discharged on torsemide 60 mg daily.  Discharge weight was 107.8 kg (237lbs).  ? ?He was seen in the heart failure clinic 10/29/2021 with mild volume overload.  He had received IV Lasix 80 mg potassium the week prior.  Weight was 254 pounds.  He was on [place], [place], [place]. ? ?Patient presented to the ER at [facility] 11/24/2021 for shortness of breath, abdominal pain, and leg pain.  Said symptoms started Wednesday after the increased the dose of Trulicity. Also has chest pain that is constant. He reports worsening shortness of breath, worse when he is laying flat.  Denies fever, cough, syncope.  Reports bilateral leg cramping, like he was dehydrated. upper abdominal pain worse with food, could no eat. If he ate food, he had diarrhea.  Reports compliance with  Xarelto and torsemide. ? ?In the ER blood pressure 169/107, pulse 97 bpm, respiratory rate 20, afebrile, 96% O2.  Labs showed CO2 20, glucose 228, creatinine 1.55, BUN 29, AST 265, ALT 262, total bili 3.5, WBC 12.5, hemoglobin 17.1.  BNP 1654.HS trop 50.  Chest x-ray with vascular congestion and mild interstitial edema consistent with CHF, bilateral opacities which could reflect edema versus  pneumonia, minimal pleural effusions.  Right upper quadrant ultrasound showed possible acute cholecystitis.   Patient was started on IV heparin.  Breathing worsen and chest CTA was ordered with concern for PE. ? ? ?Past Medical History:  ?Diagnosis Date  ? CAD (coronary artery disease)   ? a. 04/2015 low risk MV;  b. 12/2016 Cath: minor irregs in LAD/Diag/LCX/OM, RCA 40p/m/d; c. 05/2020 Cath: LM nl, LAD 50d, LCX 30p, OM1 40, RCA 100p w/ L->L collats. CO/CI 3.1/1.3-->Med rx.  ? Chest wall pain, chronic   ? CHF (congestive heart failure) ([place])   ? Chronic Troponin Elevation   ? CKD (chronic kidney disease), stage II-III   ? COPD (chronic obstructive pulmonary disease) ([place])   ? Diabetes mellitus without complication ([place])   ? HFrEF (heart failure with reduced ejection fraction) ([place])   ? Hypertension   ? Mitral regurgitation   ? Mild to moderate by October 2021 echocardiogram.  ? Mixed Ischemic & NICM (nonischemic cardiomyopathy) ([place])   ? a. 03/2015 Echo: EF 45-50%; b. 12/2015 Echo: EF 20-25%; c. 02/2016 Echo: EF 30-35%; d. 11/2016 Echo: EF 40-45%; e. 06/2019 Echo: EF 30-35%; f. 11/2019 Echo: EF 25-30%; g. 05/2020 Echo: EF 35-40%, glob HK, nl RV size/fxn, PASP 44.37mHg, mod dil LA. Mild to mod MR.  ? Myocardial infarct ([facility]   ? NSVT (nonsustained ventricular tachycardia) ([place]   ? a. 12/2015 noted on tele-->amio;  b. 12/2015 Event monitor: no VT noted.  ? Obesity (BMI 30.0-34.9)   ? Psoriasis   ? Recurrent pulmonary emboli ([place] 06/07/2020  ? 06/07/20: small bilateral PEs.  12/31/19: RUL and RLL PEs.  ? Syncope   ? a. 01/2016 - felt to be vasovagal.  ? ? ?Past Surgical History:  ?Procedure Laterality Date  ? AMPUTATION    ? CARDIAC CATHETERIZATION    ? FINGER AMPUTATION    ? Traumatic  ? FINGER FRACTURE SURGERY Left   ? LEFT HEART CATH AND CORONARY ANGIOGRAPHY N/A 01/06/2017  ? Procedure: Left Heart Cath and Coronary Angiography;  Surgeon: AWellington Hampshire MD;  [facility];  Service: Cardiovascular;   Laterality: N/A;  ? RIGHT/LEFT HEART CATH AND CORONARY ANGIOGRAPHY N/A 06/13/2020  ? Procedure: RIGHT/LEFT HEART CATH AND CORONARY ANGIOGRAPHY;  Surgeon: ENelva Bush MD;  [facility];  Service: Cardiovascular;  Laterality: N/A;  ?  ? ?Home Medications:  ?Prior to Admission medications   ?Medication Sig Start Date End Date Taking? Authorizing Provider  ?carvedilol (COREG) 6.25 MG tablet Take 1 tablet (6.25 mg total) by mouth 2 (two) times daily with a meal. 11/05/21   PGwyneth Sprout FNP  ?Continuous Blood Gluc Receiver (FREESTYLE LIBRE 2 READER) DEVI Use to monitor blood sugars as directed 10/02/21   PGwyneth Sprout FNP  ?Continuous Blood Gluc Sensor (FREESTYLE LIBRE 2 SENSOR) MISC Use to monitor blood sugars as directed 10/02/21   PGwyneth Sprout FNP  ?Dulaglutide (TRULICITY) 1.5 MHB/7.1IRSOPN Inject 1.5 mg into the skin once a week. 10/08/21   PGwyneth Sprout FNP  ?ezetimibe (ZETIA) 10 MG tablet Take 1 tablet (10  mg total) by mouth daily. 11/05/21   [name], FNP  ?fenofibrate (TRICOR) 145 MG tablet Take 1 tablet (145 mg total) by mouth daily. 09/05/21   [name], FNP  ?Insulin Glargine (BASAGLAR KWIKPEN) 100 UNIT/ML Inject 28 Units into the skin at bedtime. 09/05/21 09/26/22  [name], FNP  ?insulin lispro (HUMALOG) 100 UNIT/ML KwikPen Inject 3 Units into the skin 3 (three) times daily. 08/13/21 11/11/21  [name], MD  ?Insulin Pen Needle (PEN NEEDLES) 32G X 4 MM MISC 200 each by Does not apply route at bedtime. 09/05/21   [name], FNP  ?Ipratropium-Albuterol (COMBIVENT RESPIMAT) 20-100 MCG/ACT AERS respimat Inhale 1 puff into the lungs every 6 (six) hours as needed for wheezing.    [provider]  ?metolazone (ZAROXOLYN) 5 MG tablet TAKE 1 TABLET (5 MG TOTAL) BY MOUTH DAILY AS NEEDED (FOR WEIGHT OVER 245). 09/05/21   [name], FNP  ?nitroGLYCERIN (NITROSTAT) 0.4 MG SL tablet Place 1 tablet (0.4 mg total) under the tongue every 5 (five) minutes x 3 doses as  needed for chest pain. 06/09/20   Lorella Nimrod, MD  ?potassium chloride SA (KLOR-CON M20) 20 MEQ tablet Take 1 tablet daily 05/29/21   Alisa Graff, FNP  ?rivaroxaban (XARELTO) 20 MG TABS tablet Take 1 table

## 2021-11-24 NOTE — ED Notes (Signed)
Pt went to CT for CTA. ?

## 2021-11-24 NOTE — ED Notes (Signed)
Spoke with Dr Blaine Hamper re: whether to start heparin or not at this time because pt has surgery consult. Per Dr Blaine Hamper, start now. ?

## 2021-11-24 NOTE — ED Notes (Signed)
Pt denies nausea; admits CP same as earlier; pt still refusing nitro; educated again. Pt given water as requested.  ?

## 2021-11-24 NOTE — H&P (Addendum)
?History and Physical  ? ? ?Gabriel [name]. OEV:[id number] DOB: 06-Mar-1971 DOA: 11/24/2021 ? ?Referring MD/NP/PA:  ? ?PCP: [name], FNP  ? ?Patient coming from:  The patient is coming from home.  At baseline, pt is independent for most of ADL.       ? ?Chief Complaint: SOB and abdominal pain ? ?HPI: Gabriel [name]. is a 51 y.o. male with medical history significant of acalculous cholecystitis, hypertension, hyperlipidemia, diabetes mellitus, PE on Xarelto, CAD, mitral valve regurgitation, CHF with EF of 40-45%, CKD-3A, former smoker, OSA on CPAP, who presents with shortness of breath and abdominal pain. ? ?Patient states that he has shortness of breath for almost 3 days, which has been progressively worsening.  Patient has dry cough, no fever or chills.  He also reports chest pain, which is located in the front chest, initially 9 out of 10 in severity, currently 4 out of 10 in severity, sharp, nonradiating.  Patient also reports abdominal pain, which is located in central abdomen, initially 10 out of 10 in severity, currently 7 out of 10 in severity, sharp, nonradiating.  Associated with nausea, denies vomiting and diarrhea.  Patient also reports mild dysuria, no burning on urination.  He states that he took Xarelto last night. ? ?Of note, patient had abdominal pain and suspected acalculous cholecystitis in August 2022. Per discharge summary,"general surgery initially plan on cholecystectomy however reconsidered and pursued HIDA scan.  HIDA scan was negative and the cystic duct was patent". He did no have surgery.  ? ?Data Reviewed and ED Course: pt was found to have WBC 12.5, negative COVID PCR, BNP 1654, troponin level 50, slightly worsening renal function, abnormal liver function (ALP 63, AST 265, ALT 262, total bilirubin 3.5).  Temperature normal, blood pressure 177/100, heart rate 103, RR 28, oxygen saturation 96% on room air.  Chest x-ray showed vascular congestion, mild interstitial edema and  bilateral infrahilar opacity. RUQ-US showed possible acalculous cholecystitis.  Patient is admitted to telemetry bed as inpatient.  Dr. Peyton Najjar for general surgery is consulted. ? ?RUQ-US ?1. Gallbladder wall is thickened and edematous, with positive ?sonographic Murphy's sign on examination. No gallstones are noted. ?Findings could represent acute acalculous cholecystitis. Surgical ?consultation is recommended. Correlation with nuclear medicine ?hepatobiliary scan should be considered if clinically appropriate. ?2. Hepatic cirrhosis with evidence of hepatic steatosis. ? ?CXR: ?Perihilar vascular congestion and mild interstitial edema consistent ?with CHF or fluid overload. ?  ?There are bilateral infrahilar opacities which could reflect ?alveolar edema or pneumonia, and there are minimal pleural ?effusions. ?  ?Clinical correlation and radiographic follow-up recommended. ?  ?There were similar findings on the prior study but the interstitial ?edema is greater today. ? ? ?EKG: I have personally reviewed.  Sinus rhythm, QTc 472, LAD, RAD, poor R wave progression ? ? ?Review of Systems:  ? ?General: no fevers, chills, no body weight gain, has fatigue ?HEENT: no blurry vision, hearing changes or sore throat ?Respiratory: has dyspnea, coughing, no wheezing ?CV: has chest pain, no palpitations ?GI: has nausea,  abdominal pain, no diarrhea, constipation, vomiting, ?GU: has dysuria, no burning on urination, increased urinary frequency, hematuria  ?Ext: has leg edema ?Neuro: no unilateral weakness, numbness, or tingling, no vision change or hearing loss ?Skin: no rash, no skin tear. ?MSK: No muscle spasm, no deformity, no limitation of range of movement in spin ?Heme: No easy bruising.  ?Travel history: No recent long distant travel. ? ? ?Allergy:  ?Allergies  ?Allergen Reactions  ?  Metformin And Related Nausea And Vomiting  ? Prednisone Other (See Comments)  ?  Reaction: Hallucinations  ?  ? Zantac [Ranitidine Hcl]  Diarrhea and Nausea Only  ?  Night sweats  ? ? ?Past Medical History:  ?Diagnosis Date  ? CAD (coronary artery disease)   ? a. 04/2015 low risk MV;  b. 12/2016 Cath: minor irregs in LAD/Diag/LCX/OM, RCA 40p/m/d; c. 05/2020 Cath: LM nl, LAD 50d, LCX 30p, OM1 40, RCA 100p w/ L->L collats. CO/CI 3.1/1.3-->Med rx.  ? Chest wall pain, chronic   ? CHF (congestive heart failure) ([place])   ? Chronic Troponin Elevation   ? CKD (chronic kidney disease), stage II-III   ? COPD (chronic obstructive pulmonary disease) ([place])   ? Diabetes mellitus without complication ([place])   ? HFrEF (heart failure with reduced ejection fraction) ([place])   ? Hypertension   ? Mitral regurgitation   ? Mild to moderate by October 2021 echocardiogram.  ? Mixed Ischemic & NICM (nonischemic cardiomyopathy) ([place])   ? a. 03/2015 Echo: EF 45-50%; b. 12/2015 Echo: EF 20-25%; c. 02/2016 Echo: EF 30-35%; d. 11/2016 Echo: EF 40-45%; e. 06/2019 Echo: EF 30-35%; f. 11/2019 Echo: EF 25-30%; g. 05/2020 Echo: EF 35-40%, glob HK, nl RV size/fxn, PASP 44.61mHg, mod dil LA. Mild to mod MR.  ? Myocardial infarct ([facility]   ? NSVT (nonsustained ventricular tachycardia) ([place]   ? a. 12/2015 noted on tele-->amio;  b. 12/2015 Event monitor: no VT noted.  ? Obesity (BMI 30.0-34.9)   ? Psoriasis   ? Recurrent pulmonary emboli ([place] 06/07/2020  ? 06/07/20: small bilateral PEs.  12/31/19: RUL and RLL PEs.  ? Syncope   ? a. 01/2016 - felt to be vasovagal.  ? ? ?Past Surgical History:  ?Procedure Laterality Date  ? AMPUTATION    ? CARDIAC CATHETERIZATION    ? FINGER AMPUTATION    ? Traumatic  ? FINGER FRACTURE SURGERY Left   ? LEFT HEART CATH AND CORONARY ANGIOGRAPHY N/A 01/06/2017  ? Procedure: Left Heart Cath and Coronary Angiography;  Surgeon: AWellington Hampshire MD;  [facility];  Service: Cardiovascular;  Laterality: N/A;  ? RIGHT/LEFT HEART CATH AND CORONARY ANGIOGRAPHY N/A 06/13/2020  ? Procedure: RIGHT/LEFT HEART CATH AND CORONARY ANGIOGRAPHY;  Surgeon: ENelva Bush  MD;  [facility];  Service: Cardiovascular;  Laterality: N/A;  ? ? ?Social History:  reports that he quit smoking about 18 months ago. His smoking use included cigarettes. He has a 16.50 pack-year smoking history. He quit smokeless tobacco use about 18 months ago. He reports current alcohol use. He reports that he does not use drugs. ? ?Family History:  ?Family History  ?Problem Relation Age of Onset  ? Diabetes Mother   ? Diabetes Mellitus II Mother   ? Hypothyroidism Mother   ? Hypertension Mother   ? Kidney failure Mother   ?     Dialysis  ? Heart attack Mother   ?     417yo approximately  ? Hypertension Father   ? Gout Father   ? Cancer Maternal Grandfather   ? Diabetes Maternal Grandfather   ? Cancer Paternal Aunt   ?  ? ?Prior to Admission medications   ?Medication Sig Start Date End Date Taking? Authorizing Provider  ?carvedilol (COREG) 6.25 MG tablet Take 1 tablet (6.25 mg total) by mouth 2 (two) times daily with a meal. 11/05/21   PGwyneth Sprout FNP  ?Continuous Blood Gluc Receiver (FREESTYLE LIBRE 2 READER)  DEVI Use to monitor blood sugars as directed 10/02/21   [name], FNP  ?Continuous Blood Gluc Sensor (FREESTYLE LIBRE 2 SENSOR) MISC Use to monitor blood sugars as directed 10/02/21   [name], FNP  ?Dulaglutide (TRULICITY) 1.5 DX/8.3JA SOPN Inject 1.5 mg into the skin once a week. 10/08/21   [name], FNP  ?ezetimibe (ZETIA) 10 MG tablet Take 1 tablet (10 mg total) by mouth daily. 11/05/21   [name], FNP  ?fenofibrate (TRICOR) 145 MG tablet Take 1 tablet (145 mg total) by mouth daily. 09/05/21   [name], FNP  ?Insulin Glargine (BASAGLAR KWIKPEN) 100 UNIT/ML Inject 28 Units into the skin at bedtime. 09/05/21 09/26/22  [name], FNP  ?insulin lispro (HUMALOG) 100 UNIT/ML KwikPen Inject 3 Units into the skin 3 (three) times daily. 08/13/21 11/11/21  [name], MD  ?Insulin Pen Needle (PEN NEEDLES) 32G X 4 MM MISC 200 each by Does not apply route at bedtime.  09/05/21   [name], FNP  ?Ipratropium-Albuterol (COMBIVENT RESPIMAT) 20-100 MCG/ACT AERS respimat Inhale 1 puff into the lungs every 6 (six) hours as needed for wheezing.    [provider]  ?Desiree Hane

## 2021-11-24 NOTE — ED Notes (Signed)
Pharm staff currently reviewing meds with pt. Pt reports pain level same as before given first nitro. States never changed post nitro. Pt refusing 2nd nitro due to HA. Pt educated but still refused nitro. Pt laying on his left side on stretcher; able to talk in full sentences.  ?

## 2021-11-24 NOTE — ED Notes (Signed)
Repeat EKG handed to Dr Blaine Hamper. ?

## 2021-11-24 NOTE — Consult Note (Addendum)
SURGICAL CONSULTATION NOTE  ? ?HISTORY OF PRESENT ILLNESS (HPI):  ?51 y.o. male presented to [facility] ED for evaluation of shortness of breath and leg pain. Patient reports he has been having shortness of breath and leg swelling since Wednesday.  He has been progressively getting worse.  He did mention that he had a pain in the upper abdomen after eating 2 days ago.  The prompted the ED physician to do an abdominal ultrasound.  Patient denies any pain radiation.  Patient denies any alleviating or aggravating factors.  Chief complaint is mainly shortness of breath.  He was shortness of breath when I was doing my evaluation. ? ?At the ED he was found with mild elevated white blood cell count of 12.5 and hemoglobin of 17.1 with 244 platelets.  CMP shows elevated AST and ALT in the 200s with total bilirubin of 3.5, normal alkaline phosphatase.  BNP of 1654.7.  There is also elevated troponin of 50.  Lactic acid 2.1.  INR of 1.3.  Abdominal ultrasound shows edematous gallbladder wall with trace pericholecystic fluid.  No gallstones were seen.  I personally evaluated the images. ? ?Surgery is consulted by Dr. Blaine Hamper in this context for evaluation and management of suspected acalculous cholecystitis.. ? ?PAST MEDICAL HISTORY (PMH):  ?Past Medical History:  ?Diagnosis Date  ? CAD (coronary artery disease)   ? a. 04/2015 low risk MV;  b. 12/2016 Cath: minor irregs in LAD/Diag/LCX/OM, RCA 40p/m/d; c. 05/2020 Cath: LM nl, LAD 50d, LCX 30p, OM1 40, RCA 100p w/ L->L collats. CO/CI 3.1/1.3-->Med rx.  ? Chest wall pain, chronic   ? CHF (congestive heart failure) ([place])   ? Chronic Troponin Elevation   ? CKD (chronic kidney disease), stage II-III   ? COPD (chronic obstructive pulmonary disease) ([place])   ? Diabetes mellitus without complication ([place])   ? HFrEF (heart failure with reduced ejection fraction) ([place])   ? Hypertension   ? Mitral regurgitation   ? Mild to moderate by October 2021 echocardiogram.  ? Mixed Ischemic & NICM  (nonischemic cardiomyopathy) ([place])   ? a. 03/2015 Echo: EF 45-50%; b. 12/2015 Echo: EF 20-25%; c. 02/2016 Echo: EF 30-35%; d. 11/2016 Echo: EF 40-45%; e. 06/2019 Echo: EF 30-35%; f. 11/2019 Echo: EF 25-30%; g. 05/2020 Echo: EF 35-40%, glob HK, nl RV size/fxn, PASP 44.28mHg, mod dil LA. Mild to mod MR.  ? Myocardial infarct ([facility]   ? NSVT (nonsustained ventricular tachycardia) ([place]   ? a. 12/2015 noted on tele-->amio;  b. 12/2015 Event monitor: no VT noted.  ? Obesity (BMI 30.0-34.9)   ? Psoriasis   ? Recurrent pulmonary emboli ([place] 06/07/2020  ? 06/07/20: small bilateral PEs.  12/31/19: RUL and RLL PEs.  ? Syncope   ? a. 01/2016 - felt to be vasovagal.  ?  ? ?PAST SURGICAL HISTORY (PRogers:  ?Past Surgical History:  ?Procedure Laterality Date  ? AMPUTATION    ? CARDIAC CATHETERIZATION    ? FINGER AMPUTATION    ? Traumatic  ? FINGER FRACTURE SURGERY Left   ? LEFT HEART CATH AND CORONARY ANGIOGRAPHY N/A 01/06/2017  ? Procedure: Left Heart Cath and Coronary Angiography;  Surgeon: AWellington Hampshire MD;  [facility];  Service: Cardiovascular;  Laterality: N/A;  ? RIGHT/LEFT HEART CATH AND CORONARY ANGIOGRAPHY N/A 06/13/2020  ? Procedure: RIGHT/LEFT HEART CATH AND CORONARY ANGIOGRAPHY;  Surgeon: ENelva Bush MD;  [facility];  Service: Cardiovascular;  Laterality: N/A;  ?  ? ?MEDICATIONS:  ?Prior to  Admission medications   ?Medication Sig Start Date End Date Taking? Authorizing Provider  ?carvedilol (COREG) 6.25 MG tablet Take 1 tablet (6.25 mg total) by mouth 2 (two) times daily with a meal. 11/05/21   [name], FNP  ?Continuous Blood Gluc Receiver (FREESTYLE LIBRE 2 READER) DEVI Use to monitor blood sugars as directed 10/02/21   [name], FNP  ?Continuous Blood Gluc Sensor (FREESTYLE LIBRE 2 SENSOR) MISC Use to monitor blood sugars as directed 10/02/21   [name], FNP  ?Dulaglutide (TRULICITY) 1.5 JK/0.9FG SOPN Inject 1.5 mg into the skin once a week. 10/08/21   [name], FNP  ?ezetimibe (ZETIA) 10 MG tablet Take 1 tablet (10 mg total) by mouth daily. 11/05/21   [name], FNP  ?fenofibrate (TRICOR) 145 MG tablet Take 1 tablet (145 mg total) by mouth daily. 09/05/21   [name], FNP  ?Insulin Glargine (BASAGLAR KWIKPEN) 100 UNIT/ML Inject 28 Units into the skin at bedtime. 09/05/21 09/26/22  [name], FNP  ?insulin lispro (HUMALOG) 100 UNIT/ML KwikPen Inject 3 Units into the skin 3 (three) times daily. 08/13/21 11/11/21  [name], MD  ?Insulin Pen Needle (PEN NEEDLES) 32G X 4 MM MISC 200 each by Does not apply route at bedtime. 09/05/21   [name], FNP  ?Ipratropium-Albuterol (COMBIVENT RESPIMAT) 20-100 MCG/ACT AERS respimat Inhale 1 puff into the lungs every 6 (six) hours as needed for wheezing.    [provider]  ?metolazone (ZAROXOLYN) 5 MG tablet TAKE 1 TABLET (5 MG TOTAL) BY MOUTH DAILY AS NEEDED (FOR WEIGHT OVER 245). 09/05/21   [name], FNP  ?nitroGLYCERIN (NITROSTAT) 0.4 MG SL tablet Place 1 tablet (0.4 mg total) under the tongue every 5 (five) minutes x 3 doses as needed for chest pain. 06/09/20   Lorella Nimrod, MD  ?potassium chloride SA (KLOR-CON M20) 20 MEQ tablet Take 1 tablet daily 05/29/21   Alisa Graff, FNP  ?rivaroxaban (XARELTO) 20 MG TABS tablet Take 1 tablet (20 mg total) by mouth daily with supper. 08/13/21 11/11/21  [name], MD  ?rosuvastatin (CRESTOR) 20 MG tablet Take 1 tablet (20 mg total) by mouth daily. 09/05/21   [name], FNP  ?sacubitril-valsartan (ENTRESTO) 24-26 MG Take 1 tablet by mouth 2 (two) times daily. 10/26/21   Alisa Graff, FNP  ?sildenafil (VIAGRA) 100 MG tablet Take 1 tablets 1 hour prior to intercourse. 11/19/21   Stoioff, Ronda Fairly, MD  ?torsemide 60 MG TABS Take 60 mg by mouth daily. 09/30/21   Lorella Nimrod, MD  ?  ? ?ALLERGIES:  ?Allergies  ?Allergen Reactions  ? Metformin And Related Nausea And Vomiting  ? Prednisone Other (See Comments)  ?  Reaction: Hallucinations  ?  ? Zantac  [Ranitidine Hcl] Diarrhea and Nausea Only  ?  Night sweats  ?  ? ?SOCIAL HISTORY:  ?Social History  ? ?Socioeconomic History  ? Marital status: Widowed  ?  Spouse name: Not on file  ? Number of children: 1  ? Years of education: 47  ? Highest education level: 12th grade  ?Occupational History  ? Occupation: disabled  ?Tobacco Use  ? Smoking status: Former  ?  Packs/day: 0.50  ?  Years: 33.00  ?  Pack years: 16.50  ?  Types: Cigarettes  ?  Quit date: 05/08/2020  ?  Years since quitting: 1.5  ? Smokeless tobacco: Former  ?  Quit date: 05/08/2020  ? Tobacco comments:  ?  Quit Sept 2021  ?Vaping Use  ? Vaping Use: Former  ?Substance and Sexual Activity  ? Alcohol use: Yes  ?  Alcohol/week: 0.0 standard drinks  ?  Comment: occassionally  ? Drug use: No  ? Sexual activity: Not Currently  ?Other Topics Concern  ? Not on file  ?Social History Narrative  ? Lives at home with wife and his dad's wife.    ?   ? 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   ? ?Social Determinants of Health  ? ?Financial Resource Strain: Not on file  ?Food Insecurity: Not on file  ?Transportation Needs: Not on file  ?Physical Activity: Not on file  ?Stress: Not on file  ?Social Connections: Not on file  ?Intimate Partner Violence: Not on file  ?  ? ? ?FAMILY HISTORY:  ?Family History  ?Problem Relation Age of Onset  ? Diabetes Mother   ? Diabetes Mellitus II Mother   ? Hypothyroidism Mother   ? Hypertension Mother   ? Kidney failure Mother   ?     Dialysis  ? Heart attack Mother   ?     79 yo approximately  ? Hypertension Father   ? Gout Father   ? Cancer Maternal Grandfather   ? Diabetes Maternal Grandfather   ? Cancer Paternal Aunt   ?  ? ?REVIEW OF SYSTEMS:  ?Constitutional: denies weight loss, fever, chills, or sweats  ?Eyes: denies any other vision changes, history of eye injury  ?ENT: denies sore throat, hearing problems  ?Respiratory: Positive shortness of breath ?Cardiovascular:  Positive chest pain, palpitations  ?Gastrointestinal: Positive abdominal pain, nausea and vomiting ?Genitourinary: denies burning with urination or urinary frequency ?Musculoskeletal: denies any other joint pains or cramps  ?

## 2021-11-24 NOTE — Progress Notes (Signed)
ANTICOAGULATION CONSULT NOTE ? ?Pharmacy Consult for heparin initiation & monitoring ?Indication: h/o pulmonary embolus ? ?Patient Measurements: ?Height: 6' (182.9 cm) ?Weight: 111.1 kg (245 lb) ?IBW/kg (Calculated) : 77.6 ?Heparin Dosing Weight: 102 kg ? ?Vital Signs: ?Temp: 97.7 ?F (36.5 ?C) (04/08 0534) ?Temp Source: Oral (04/08 0534) ?BP: 178/107 (04/08 0700) ?Pulse Rate: 102 (04/08 0700) ? ?Labs: ?Recent Labs  ?  11/24/21 ?4239  ?HGB 17.1*  ?HCT 53.8*  ?PLT 244  ?CREATININE 1.55*  ?TROPONINIHS 50*  ? ? ?Estimated Creatinine Clearance: 71.8 mL/min (A) (by C-G formula based on SCr of 1.55 mg/dL (H)). ? ? ?Medical History: ?Past Medical History:  ?Diagnosis Date  ? CAD (coronary artery disease)   ? a. 04/2015 low risk MV;  b. 12/2016 Cath: minor irregs in LAD/Diag/LCX/OM, RCA 40p/m/d; c. 05/2020 Cath: LM nl, LAD 50d, LCX 30p, OM1 40, RCA 100p w/ L->L collats. CO/CI 3.1/1.3-->Med rx.  ? Chest wall pain, chronic   ? CHF (congestive heart failure) ([place])   ? Chronic Troponin Elevation   ? CKD (chronic kidney disease), stage II-III   ? COPD (chronic obstructive pulmonary disease) ([place])   ? Diabetes mellitus without complication ([place])   ? HFrEF (heart failure with reduced ejection fraction) ([place])   ? Hypertension   ? Mitral regurgitation   ? Mild to moderate by October 2021 echocardiogram.  ? Mixed Ischemic & NICM (nonischemic cardiomyopathy) ([place])   ? a. 03/2015 Echo: EF 45-50%; b. 12/2015 Echo: EF 20-25%; c. 02/2016 Echo: EF 30-35%; d. 11/2016 Echo: EF 40-45%; e. 06/2019 Echo: EF 30-35%; f. 11/2019 Echo: EF 25-30%; g. 05/2020 Echo: EF 35-40%, glob HK, nl RV size/fxn, PASP 44.63mHg, mod dil LA. Mild to mod MR.  ? [facility]ct ([facility]   ? NSVT (nonsustained ventricular tachycardia) ([place]   ? a. 12/2015 noted on tele-->amio;  b. 12/2015 Event monitor: no VT noted.  ? Obesity (BMI 30.0-34.9)   ? Psoriasis   ? Recurrent pulmonary emboli ([facility] 06/07/2020  ? 06/07/20: small bilateral PEs.  12/31/19: RUL and RLL PEs.  ? Syncope   ?  a. 01/2016 - felt to be vasovagal.  ? ? ?Medications: rivaroxaban 20 mg once daily (last dose last night 11/23/21) ? ?Assessment: Pharmacy consulted for heparin infusion dosing and monitoring for 51yo male admitted with SOB.  He has PMH of CHF, CKD, COPD, DM, HTN, and PE. ? ?Goal of Therapy:  ?Heparin level 0.3-0.7 units/ml ?aPTT 66 - 102 seconds ?Monitor platelets by anticoagulation protocol: Yes ?  ?Plan:  ?Start heparin infusion at 2200 units/hr (rate based on previous admission with heparin infusion) ?Check aPTT 6 hours after initiation of infusion ?Daily CBC per protocol ? ?RDallie Piles?11/24/2021,7:38 AM ? ? ?

## 2021-11-25 DIAGNOSIS — Z9989 Dependence on other enabling machines and devices: Secondary | ICD-10-CM | POA: Diagnosis not present

## 2021-11-25 DIAGNOSIS — K529 Noninfective gastroenteritis and colitis, unspecified: Secondary | ICD-10-CM | POA: Diagnosis not present

## 2021-11-25 DIAGNOSIS — I5043 Acute on chronic combined systolic (congestive) and diastolic (congestive) heart failure: Secondary | ICD-10-CM | POA: Diagnosis not present

## 2021-11-25 DIAGNOSIS — G4733 Obstructive sleep apnea (adult) (pediatric): Secondary | ICD-10-CM | POA: Diagnosis not present

## 2021-11-25 DIAGNOSIS — R6510 Systemic inflammatory response syndrome (SIRS) of non-infectious origin without acute organ dysfunction: Secondary | ICD-10-CM

## 2021-11-25 LAB — COMPREHENSIVE METABOLIC PANEL
ALT: 251 U/L — ABNORMAL HIGH (ref 0–44)
AST: 140 U/L — ABNORMAL HIGH (ref 15–41)
Albumin: 3.1 g/dL — ABNORMAL LOW (ref 3.5–5.0)
Alkaline Phosphatase: 45 U/L (ref 38–126)
Anion gap: 9 (ref 5–15)
BUN: 29 mg/dL — ABNORMAL HIGH (ref 6–20)
CO2: 26 mmol/L (ref 22–32)
Calcium: 8.5 mg/dL — ABNORMAL LOW (ref 8.9–10.3)
Chloride: 104 mmol/L (ref 98–111)
Creatinine, Ser: 1.47 mg/dL — ABNORMAL HIGH (ref 0.61–1.24)
GFR, Estimated: 57 mL/min — ABNORMAL LOW (ref >60)
Glucose, Bld: 137 mg/dL — ABNORMAL HIGH (ref 70–99)
Potassium: 3.8 mmol/L (ref 3.5–5.1)
Sodium: 139 mmol/L (ref 135–145)
Total Bilirubin: 2 mg/dL — ABNORMAL HIGH (ref 0.3–1.2)
Total Protein: 6.3 g/dL — ABNORMAL LOW (ref 6.5–8.1)

## 2021-11-25 LAB — HEPATITIS PANEL, ACUTE
HCV Ab: NONREACTIVE
Hep A IgM: NONREACTIVE
Hep B C IgM: NONREACTIVE
Hepatitis B Surface Ag: NONREACTIVE

## 2021-11-25 LAB — CBC
HCT: 48.6 % (ref 39.0–52.0)
Hemoglobin: 15.8 g/dL (ref 13.0–17.0)
MCH: 27.8 pg (ref 26.0–34.0)
MCHC: 32.5 g/dL (ref 30.0–36.0)
MCV: 85.4 fL (ref 80.0–100.0)
Platelets: 183 10*3/uL (ref 150–400)
RBC: 5.69 MIL/uL (ref 4.22–5.81)
RDW: 14.6 % (ref 11.5–15.5)
WBC: 10.1 10*3/uL (ref 4.0–10.5)
nRBC: 0 % (ref 0.0–0.2)

## 2021-11-25 LAB — PROCALCITONIN: Procalcitonin: 0.41 ng/mL

## 2021-11-25 LAB — LIPID PANEL
Cholesterol: 110 mg/dL (ref 0–200)
HDL: 17 mg/dL — ABNORMAL LOW (ref >40)
LDL Cholesterol: 62 mg/dL (ref 0–99)
Total CHOL/HDL Ratio: 6.5 RATIO
Triglycerides: 153 mg/dL — ABNORMAL HIGH (ref <150)
VLDL: 31 mg/dL (ref 0–40)

## 2021-11-25 LAB — GLUCOSE, CAPILLARY
Glucose-Capillary: 137 mg/dL — ABNORMAL HIGH (ref 70–99)
Glucose-Capillary: 125 mg/dL — ABNORMAL HIGH (ref 70–99)
Glucose-Capillary: 159 mg/dL — ABNORMAL HIGH (ref 70–99)
Glucose-Capillary: 215 mg/dL — ABNORMAL HIGH (ref 70–99)

## 2021-11-25 LAB — APTT: aPTT: 97 seconds — ABNORMAL HIGH (ref 24–36)

## 2021-11-25 LAB — MAGNESIUM: Magnesium: 1.8 mg/dL (ref 1.7–2.4)

## 2021-11-25 LAB — STREP PNEUMONIAE URINARY ANTIGEN: Strep Pneumo Urinary Antigen: NEGATIVE

## 2021-11-25 LAB — HEPARIN LEVEL (UNFRACTIONATED): Heparin Unfractionated: 0.47 IU/mL (ref 0.30–0.70)

## 2021-11-25 MED ORDER — SUCRALFATE 1 G PO TABS
1.0000 g | ORAL_TABLET | Freq: Three times a day (TID) | ORAL | Status: DC
  Administered 2021-11-25 – 2021-11-28 (×11): 1 g via ORAL
  Filled 2021-11-25 (×11): qty 1

## 2021-11-25 MED ORDER — SPIRONOLACTONE 25 MG PO TABS
25.0000 mg | ORAL_TABLET | Freq: Every day | ORAL | Status: DC
  Administered 2021-11-25 – 2021-11-28 (×4): 25 mg via ORAL
  Filled 2021-11-25 (×4): qty 1

## 2021-11-25 MED ORDER — PANTOPRAZOLE SODIUM 40 MG PO TBEC
40.0000 mg | DELAYED_RELEASE_TABLET | Freq: Two times a day (BID) | ORAL | Status: DC
  Administered 2021-11-25 – 2021-11-28 (×6): 40 mg via ORAL
  Filled 2021-11-25 (×6): qty 1

## 2021-11-25 NOTE — Hospital Course (Signed)
Gabriel [name]. is a 51 y.o. male with medical history significant of acalculous cholecystitis, hypertension, hyperlipidemia, diabetes mellitus, PE on Xarelto, CAD, mitral valve regurgitation, CHF with EF of 40-45%, CKD-3A, former smoker, OSA on CPAP, who presents with shortness of breath and abdominal pain. ?Patient had an episode of diarrhea on Wednesday, he was also nauseous, poor appetite.  He started have abdominal cramping pain since then.  CT scan and ultrasound suspect cholecystitis, HIDA scan has not cholecystitis. ?Patient is diagnosed with acute on chronic systolic congestive heart failure.  Was started on IV Lasix. ?  ?

## 2021-11-25 NOTE — Assessment & Plan Note (Addendum)
Reviewed prior echocardiogram performed on 05/2021, ejection fraction was 40 to 45%.  Repeat echocardiogram 4/8 showed a drastic reduction of ejection fraction to 20 to 25% with a moderate aortic valve regurgitation and diastolic dysfunction.  Peak troponin 59. ?Patient had a significant short of breath, elevated BNP at 1654.7.  Chest x-ray showed vascular congestion and interstitial edema.  Patient condition is consistent with acute on chronic congestive heart failure. Marland Kitchen ?Patient is also continued on Entresto, spironolactone, and Coreg.  Wilder Glade is also started by cardiology. ?Patient condition has been improving, he developed significant muscle spasm from Lasix.  Reduce IV Lasix to 40 mg twice a day. ?Patient is a followed by cardiology. ? ?

## 2021-11-25 NOTE — Assessment & Plan Note (Addendum)
Secondary to exacerbation congestive heart failure.  Cardiology is considering outpatient work-up for ischemia. ?

## 2021-11-25 NOTE — TOC Initial Note (Signed)
Transition of Care (TOC) - Initial/Assessment Note  ? ? ?Patient Details  ?Name: Gabriel [name]. ?MRN: [id number] ?Date of Birth: May 31, 1971 ? ?Transition of Care (TOC) CM/SW Contact:    ?Alberteen Sam, LCSW ?Phone Number: ?11/25/2021, 12:30 PM ? ?Clinical Narrative:                 ? ?Readmission risk assess completed. ?  ?PCP is Tally Joe, FNP at [facility]. Patient drives himself to appointments. [facility] No issues obtaining medications. No home health or DME use prior to admission. Patient has a scale at home and understands importance of weighing daily.  ?  ?No further concerns. CSW encouraged patient to contact CSW as needed. CSW will continue to follow patient for support and facilitate return home when stable.    ?      ?Expected Discharge Plan: Home/Self Care ?Barriers to Discharge: Continued Medical Work up ? ? ?Patient Goals and CMS Choice ?Patient states their goals for this hospitalization and ongoing recovery are:: to go home ?CMS Medicare.gov Compare Post Acute Care list provided to:: Patient ?Choice offered to / list presented to : Patient ? ?Expected Discharge Plan and Services ?Expected Discharge Plan: Home/Self Care ?  ?  ?  ?Living arrangements for the past 2 months: [place] ?                ?  ?  ?  ?  ?  ?  ?  ?  ?  ?  ? ?Prior Living Arrangements/Services ?Living arrangements for the past 2 months: [employer] ?Lives with:: Self ?  ?       ?  ?  ?  ?  ? ?Activities of Daily Living ?Home Assistive Devices/Equipment: None ?ADL Screening (condition at time of admission) ?Patient's cognitive ability adequate to safely complete daily activities?: Yes ?Is the patient deaf or have difficulty hearing?: No ?Does the patient have difficulty seeing, even when wearing glasses/contacts?: No ?Does the patient have difficulty concentrating, remembering, or making decisions?: No ?Patient able to express need for assistance with ADLs?: Yes ?Does the  patient have difficulty dressing or bathing?: No ?Independently performs ADLs?: Yes (appropriate for developmental age) ?Does the patient have difficulty walking or climbing stairs?: No ?Weakness of Legs: None ?Weakness of Arms/Hands: None ? ?Permission Sought/Granted ?  ?  ?   ?   ?   ?   ? ?Emotional Assessment ?  ?  ?  ?Orientation: : Oriented to Self, Oriented to Place, Oriented to  Time, Oriented to Situation ?Alcohol / Substance Use: Not Applicable ?Psych Involvement: No (comment) ? ?Admission diagnosis:  Shortness of breath [R06.02] ?Acalculous cholecystitis [K81.9] ?Acute on chronic systolic congestive heart failure ([place]) [I50.23] ?Acute on chronic systolic CHF (congestive heart failure) ([place]) [I50.23] ?Community acquired pneumonia of right lower lobe of lung [J18.9] ?Patient Active Problem List  ? Diagnosis Date Noted  ? Gastroenteritis 11/25/2021  ? SIRS (systemic inflammatory response syndrome) ([place]) 11/25/2021  ? Abnormal LFTs 11/24/2021  ? Pulmonary embolism ([place]) 11/24/2021  ? Acute on chronic systolic congestive heart failure ([place])   ? Annual physical exam 11/05/2021  ? Screening for malignant neoplasm of prostate 11/05/2021  ? Elevated serum creatinine 11/05/2021  ? Difficulty attaining erection 11/05/2021  ? Chronic HFrEF (heart failure with reduced ejection fraction) ([place]) 11/05/2021  ? Epistaxis 09/27/2021  ? CAD (coronary artery disease) 09/27/2021  ? Right foot pain 09/05/2021  ? Arch pain  of right foot 09/05/2021  ? Elevated triglycerides with high cholesterol 09/05/2021  ? Memory loss or impairment 09/05/2021  ? Hyperlipidemia associated with type 2 diabetes mellitus ([place]) 09/05/2021  ? Hospital discharge follow-up 09/05/2021  ? Dyspnea 08/10/2021  ? Acute on chronic systolic (congestive) heart failure ([place]) 08/09/2021  ? Type II or unspecified type diabetes mellitus without mention of complication, not stated as uncontrolled 05/21/2021  ? Acute on chronic diastolic CHF (congestive heart  failure) ([place]) 05/21/2021  ? Long term current use of anticoagulant 05/20/2021  ? Mitral regurgitation 05/20/2021  ? Mediastinal lymphadenopathy 05/20/2021  ? Atherosclerotic heart disease of native coronary artery with other forms of angina pectoris ([place])   ? Acalculous cholecystitis 03/29/2021  ? Right upper quadrant abdominal pain 03/28/2021  ? AKI (acute kidney injury) ([place]) 11/24/2020  ? Acute kidney injury ([place]) 11/23/2020  ? OSA on CPAP 11/23/2020  ? Chest pain 10/27/2020  ? Snoring 08/15/2020  ? Type II diabetes mellitus with renal manifestations ([place]) 06/11/2020  ? COPD (chronic obstructive pulmonary disease) ([place]) 06/11/2020  ? Leukocytosis 06/11/2020  ? Hyperglycemia   ? Elevated brain natriuretic peptide (BNP) level   ? Uncontrolled type 2 diabetes mellitus with hyperglycemia, with long-term current use of insulin ([place]) 05/13/2020  ? Acute on chronic HFrEF (heart failure with reduced ejection fraction) ([place]) 05/12/2020  ? Erectile dysfunction due to arterial insufficiency 03/15/2020  ? Screening PSA (prostate specific antigen) 03/15/2020  ? Erectile dysfunction 03/02/2020  ? History of pulmonary embolism   ? Hemoptysis   ? Costochondritis   ? Acute on chronic combined systolic (congestive) and diastolic (congestive) heart failure ([place]) 12/20/2019  ? Acute on chronic heart failure ([place]) 11/22/2019  ? CKD (chronic kidney disease), stage IIIa 06/26/2019  ? Type 2 diabetes mellitus with hyperlipidemia ([place]) 06/26/2019  ? Hypertensive urgency 06/26/2019  ? Acute on chronic combined systolic and diastolic CHF (congestive heart failure) ([place]) 09/02/2017  ? Other chest pain 08/24/2017  ? Dizziness   ? Noncompliance with medications 04/29/2017  ? Back pain 03/06/2017  ? Tobacco use 12/04/2016  ? Gallbladder sludge 12/04/2016  ? Acute on chronic systolic CHF (congestive heart failure) ([place]) 11/28/2016  ? Coronary artery disease, non-occlusive 03/15/2016  ? Atypical chest pain 02/16/2016  ? Orthostatic hypotension  01/23/2016  ? Hypomagnesemia 01/23/2016  ? Chronic systolic CHF (congestive heart failure) ([place]) 01/23/2016  ? NICM (nonischemic cardiomyopathy) ([place]) 01/17/2016  ? NSVT (nonsustained ventricular tachycardia) ([place])   ? Troponin I above reference range   ? Mixed hyperlipidemia 05/17/2015  ? COPD exacerbation ([place]) 05/17/2015  ? Essential hypertension 03/31/2015  ? Elevated troponin 03/20/2015  ? ?PCP:  [name], FNP ?Pharmacy:   ?[id number] [id number]?Phone: [phone]: [phone]? [id number]?Phone: [phone]: [phone]? ? ? ? ?Social Determinants of Health (SDOH) Interventions ?  ? ?Readmission Risk Interventions ? ?  08/13/2021  ?  9:45 AM 05/22/2021  ?  4:19 PM 11/27/2020  ?  8:58 AM  ?Readmission Risk Prevention Plan  ?Transportation Screening Complete Complete   ?PCP or Specialist Appt within 3-5 Days Complete    ?Social Work Consult for [place]/Counseling Complete Complete   ?Palliative Care Screening Not Applicable Not Applicable   ?Medication Review (Press photographer Complete Complete   ?PCP or Specialist appointment within 3-5  days of discharge   Complete  ? ? ? ?

## 2021-11-25 NOTE — Assessment & Plan Note (Addendum)
Renal function still stable on diuretics ?

## 2021-11-25 NOTE — Assessment & Plan Note (Addendum)
Condition stable  

## 2021-11-25 NOTE — Assessment & Plan Note (Addendum)
Severe sepsis is ruled out. ?No evidence of bacterial infection. ?

## 2021-11-25 NOTE — Assessment & Plan Note (Signed)
Continue CPAP while asleep. ?

## 2021-11-25 NOTE — Progress Notes (Signed)
? ?Progress Note ? ?Patient Name: Gabriel [name]. ?Date of Encounter: 11/25/2021 ? ?[facility] Cardiologist: [name], MD   ? ?Subjective  ? ?51 year old gentleman with a history of coronary artery disease, chronic chest pain, chronic troponin elevation, diastolic dysfunction, nonsustained ventricular tachycardia , COPD, chronic anticoagulation for an unprovoked pulmonary embolus, obstructive sleep apnea who was admitted with chest discomfort/chest pain. ? ?He was found to have markedly elevated liver enzymes yesterday.  HIDA scan was negative for biliary obstruction. ? ?He has diuresed nicely overnight.  His net output is 2.8 L so far during this hospitalization. ?With diuresis his creatinine has actually come down slightly.  His liver enzymes are slightly improved.  Bilirubin level is improved. ? ?We discussed the fact that he eats lots of processed meats. ?He insisted that everything had salt in it and it cannot be avoided ?I listed multiple meats that were not processed ( grilled/baked fish, chicken, fresh vegetables )  ?He didn't want to hear what I said and repeatedly inturrupted me . ? ? ?Inpatient Medications  ?  ?Scheduled Meds: ? carvedilol  6.25 mg Oral BID WC  ? ezetimibe  10 mg Oral Daily  ? fenofibrate  160 mg Oral Daily  ? furosemide  60 mg Intravenous BID  ? insulin aspart  0-5 Units Subcutaneous QHS  ? insulin aspart  0-9 Units Subcutaneous TID WC  ? insulin glargine-yfgn  20 Units Subcutaneous Q2200  ? ipratropium-albuterol  3 mL Nebulization Q6H  ? potassium chloride  20 mEq Oral BID  ? rosuvastatin  20 mg Oral QHS  ? sacubitril-valsartan  1 tablet Oral BID  ? ?Continuous Infusions: ? azithromycin (ZITHROMAX) 500 MG IVPB (Vial-Mate Adaptor) 500 mg (11/25/21 0558)  ? cefTRIAXone (ROCEPHIN)  IV 2 g (11/25/21 0517)  ? heparin 2,200 Units/hr (11/25/21 1655)  ? ?PRN Meds: ?albuterol, dextromethorphan-guaiFENesin, hydrALAZINE, HYDROmorphone (DILAUDID) injection, nitroGLYCERIN, ondansetron  (ZOFRAN) IV  ? ?Vital Signs  ?  ?Vitals:  ? 11/25/21 0037 11/25/21 3748 11/25/21 0316 11/25/21 2707  ?BP: 127/79  135/89 (!) 134/94  ?Pulse: 75 70 76 76  ?Resp: '18  15 18  '$ ?Temp: (!) 97.4 ?F (36.3 ?C)  98 ?F (36.7 ?C) 97.7 ?F (36.5 ?C)  ?TempSrc:      ?SpO2: 97% 97% 100% 99%  ?Weight:   113.8 kg   ?Height:      ? ? ?Intake/Output Summary (Last 24 hours) at 11/25/2021 1122 ?Last data filed at 11/25/2021 1013 ?Gross per 24 hour  ?Intake 973.11 ml  ?Output 3225 ml  ?Net -2251.89 ml  ? ? ?  11/25/2021  ?  3:16 AM 11/24/2021  ?  5:46 AM 11/19/2021  ?  7:50 AM  ?Last 3 Weights  ?Weight (lbs) 250 lb 14.4 oz 245 lb 243 lb  ?Weight (kg) 113.807 kg 111.131 kg 110.224 kg  ?   ? ?Telemetry  ?  ?Nsr  - Personally Reviewed ? ?ECG  ?  ? - Personally Reviewed ? ?Physical Exam  ?  ?GEN: No acute distress.   ?Neck: No JVD ?Cardiac: RRR, no murmurs, rubs, or gallops.  ?Respiratory: Clear to auscultation bilaterally. ?GI: Soft, nontender, non-distended  ?MS: No edema; No deformity. ?Neuro:  Nonfocal  ?Psych: Normal affect  ? ?Labs  ?  ?High Sensitivity Troponin:   ?Recent Labs  ?Lab 11/24/21 ?8675 11/24/21 ?4492 11/24/21 ?0930  ?TROPONINIHS 50* 55* 59*  ?   ?Chemistry ?Recent Labs  ?Lab 11/24/21 ?0100 11/25/21 ?7121  ?NA 137 139  ?K 4.5  3.8  ?CL 106 104  ?CO2 20* 26  ?GLUCOSE 228* 137*  ?BUN 29* 29*  ?CREATININE 1.55* 1.47*  ?CALCIUM 9.0 8.5*  ?MG  --  1.8  ?PROT 7.1 6.3*  ?ALBUMIN 3.8 3.1*  ?AST 265* 140*  ?ALT 262* 251*  ?ALKPHOS 63 45  ?BILITOT 3.5* 2.0*  ?GFRNONAA 54* 57*  ?ANIONGAP 11 9  ?  ?Lipids  ?Recent Labs  ?Lab 11/25/21 ?0620  ?CHOL 110  ?TRIG 153*  ?HDL 17*  ?[place] 62  ?CHOLHDL 6.5  ?  ?Hematology ?Recent Labs  ?Lab 11/24/21 ?5956 11/25/21 ?3875  ?WBC 12.5* 10.1  ?RBC 6.28* 5.69  ?HGB 17.1* 15.8  ?HCT 53.8* 48.6  ?MCV 85.7 85.4  ?MCH 27.2 27.8  ?MCHC 31.8 32.5  ?RDW 15.4 14.6  ?PLT 244 183  ? ?Thyroid No results for input(s): TSH, FREET4 in the last 168 hours.  ?BNP ?Recent Labs  ?Lab 11/24/21 ?6433  ?BNP 1,654.7*  ?  ?DDimer No  results for input(s): DDIMER in the last 168 hours.  ? ?Radiology  ?  ?CT Angio Chest Pulmonary Embolism (PE) W or WO Contrast ? ?Result Date: 11/24/2021 ?CLINICAL DATA:  Short of breath. Abdominal pain. Bilateral leg pain. Symptoms for 3-4 days. EXAM: CT ANGIOGRAPHY CHEST WITH CONTRAST TECHNIQUE: Multidetector CT imaging of the chest was performed using the standard protocol during bolus administration of intravenous contrast. Multiplanar CT image reconstructions and MIPs were obtained to evaluate the vascular anatomy. RADIATION DOSE REDUCTION: This exam was performed according to the departmental dose-optimization program which includes automated exposure control, adjustment of the mA and/or kV according to patient size and/or use of iterative reconstruction technique. CONTRAST:  88m OMNIPAQUE IOHEXOL 350 MG/ML SOLN COMPARISON:  08/09/2021. FINDINGS: Cardiovascular: Pulmonary arteries are well opacified. There is no evidence of a pulmonary embolism. Heart top-normal in size. Trace pericardial effusion. Three-vessel coronary artery calcifications. Great vessels are normal in caliber. Aorta is not opacified. Mediastinum/Nodes: Multiple subcentimeter shotty mediastinal lymph nodes. Enlarged right infrahilar node, 1.7 cm in short axis, similar to the prior CT. No mediastinal or hilar masses. Trachea and esophagus are unremarkable. Lungs/Pleura: Small, right greater than left, pleural effusions. Interstitial thickening and hazy areas of ground-glass opacity are similar to the prior chest CTA, consistent with congestive heart failure and pulmonary edema. No convincing pneumonia. Several small calcified granuloma in the lower lobes, also stable. No pneumothorax. Upper Abdomen: No acute findings. Musculoskeletal: No fracture or acute finding.  No bone lesion. Review of the MIP images confirms the above findings. IMPRESSION: 1. No evidence of a pulmonary embolism. 2. Findings consistent with mild congestive heart failure  reflected by borderline cardiomegaly, small pleural effusions, interstitial thickening and hazy areas of ground-glass lung opacities. Findings are similar to the prior chest CTA. 3. No convincing pneumonia. Electronically Signed   By: DLajean ManesM.D.   On: 11/24/2021 10:28  ? ?NM Hepatobiliary Liver Func ? ?Result Date: 11/24/2021 ?CLINICAL DATA:  RUQ abdominal pain, UKoreanondiagnostic EXAM: NUCLEAR MEDICINE HEPATOBILIARY IMAGING TECHNIQUE: Sequential images of the abdomen were obtained out to 60 minutes following intravenous administration of radiopharmaceutical. RADIOPHARMACEUTICALS:  5.46 mCi Tc-960mCholetec IV COMPARISON:  November 24, 2021 ultrasound abdomen FINDINGS: Prompt uptake and biliary excretion of activity by the liver is seen. Gallbladder activity is visualized, consistent with patency of cystic duct. Biliary activity passes into small bowel, consistent with patent common bile duct. IMPRESSION: Patent cystic duct.  Patent common bile duct. Electronically Signed   By: StValentino Saxon.D.  On: 11/24/2021 14:22  ? ?DG Chest Portable 1 View ? ?Result Date: 11/24/2021 ?CLINICAL DATA:  CHF with increasing shortness of breath. EXAM: PORTABLE CHEST 1 VIEW COMPARISON:  PA and lateral chest 09/27/2021. FINDINGS: Heart is enlarged. There is perihilar vascular distension and flow cephalization and mild interstitial edema. Minimal pleural effusions are forming. There are bilateral infrahilar opacities which could be due to alveolar edema or pneumonia. Remaining lungs show no focal infiltrates. Linear scar-like opacity chronically seen left mid field. IMPRESSION: Perihilar vascular congestion and mild interstitial edema consistent with CHF or fluid overload. There are bilateral infrahilar opacities which could reflect alveolar edema or pneumonia, and there are minimal pleural effusions. Clinical correlation and radiographic follow-up recommended. There were similar findings on the prior study but the interstitial  edema is greater today. Electronically Signed   By: Telford Nab M.D.   On: 11/24/2021 06:28  ? ?ECHOCARDIOGRAM COMPLETE ? ?Result Date: 11/24/2021 ?   ECHOCARDIOGRAM REPORT   Patient Name:   MATHEO RATHBONE

## 2021-11-25 NOTE — Assessment & Plan Note (Addendum)
Patient has a history of PE, continue anticoagulation with Xarelto. ?

## 2021-11-25 NOTE — Assessment & Plan Note (Addendum)
Acute hepatitis panel negative, this appears to be secondary to congestive heart failure. ?

## 2021-11-25 NOTE — Assessment & Plan Note (Signed)
Ruled out ?

## 2021-11-25 NOTE — Progress Notes (Signed)
ANTICOAGULATION CONSULT NOTE ? ?Pharmacy Consult for heparin initiation & monitoring ?Indication: h/o pulmonary embolus ? ?Patient Measurements: ?Height: 6' (182.9 cm) ?Weight: 113.8 kg (250 lb 14.4 oz) ?IBW/kg (Calculated) : 77.6 ?Heparin Dosing Weight: 102 kg ? ?Vital Signs: ?Temp: 98 ?F (36.7 ?C) (04/09 0316) ?BP: 135/89 (04/09 0316) ?Pulse Rate: 76 (04/09 0316) ? ?Labs: ?Recent Labs  ?  11/24/21 ?8325 11/24/21 ?4982 11/24/21 ?6415 11/24/21 ?8309 11/24/21 ?4076 11/24/21 ?0930 11/24/21 ?1557 11/24/21 ?2131 11/25/21 ?8088  ?HGB 17.1*  --   --   --   --   --   --   --  15.8  ?HCT 53.8*  --   --   --   --   --   --   --  48.6  ?PLT 244  --   --   --   --   --   --   --  183  ?APTT  --   --   --  29   < >  --  78* 88* 97*  ?LABPROT  --   --   --  15.7*  --   --   --   --   --   ?INR  --   --   --  1.3*  --   --   --   --   --   ?HEPARINUNFRC  --   --  0.18*  --   --   --   --   --  0.47  ?CREATININE 1.55*  --   --   --   --   --   --   --  1.47*  ?TROPONINIHS 50* 55*  --   --   --  59*  --   --   --   ? < > = values in this interval not displayed.  ? ? ? ?Estimated Creatinine Clearance: 76.6 mL/min (A) (by C-G formula based on SCr of 1.47 mg/dL (H)). ? ? ?Medical History: ?Past Medical History:  ?Diagnosis Date  ? CAD (coronary artery disease)   ? a. 04/2015 low risk MV;  b. 12/2016 Cath: minor irregs in LAD/Diag/LCX/OM, RCA 40p/m/d; c. 05/2020 Cath: LM nl, LAD 50d, LCX 30p, OM1 40, RCA 100p w/ L->L collats. CO/CI 3.1/1.3-->Med rx.  ? Chest wall pain, chronic   ? CHF (congestive heart failure) ([place])   ? Chronic Troponin Elevation   ? CKD (chronic kidney disease), stage II-III   ? COPD (chronic obstructive pulmonary disease) ([place])   ? Diabetes mellitus without complication ([place])   ? HFrEF (heart failure with reduced ejection fraction) ([place])   ? Hypertension   ? Mitral regurgitation   ? Mild to moderate by October 2021 echocardiogram.  ? Mixed Ischemic & NICM (nonischemic cardiomyopathy) ([place])   ? a. 03/2015 Echo: EF  45-50%; b. 12/2015 Echo: EF 20-25%; c. 02/2016 Echo: EF 30-35%; d. 11/2016 Echo: EF 40-45%; e. 06/2019 Echo: EF 30-35%; f. 11/2019 Echo: EF 25-30%; g. 05/2020 Echo: EF 35-40%, glob HK, nl RV size/fxn, PASP 44.18mHg, mod dil LA. Mild to mod MR.  ? Myocardial infarct ([facility]   ? NSVT (nonsustained ventricular tachycardia) ([place]   ? a. 12/2015 noted on tele-->amio;  b. 12/2015 Event monitor: no VT noted.  ? Obesity (BMI 30.0-34.9)   ? Psoriasis   ? 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 06/07/20: small bilateral PEs.  12/31/19: RUL and RLL PEs.  ? Syncope   ? a. 01/2016 - felt to be vasovagal.  ? ? ?  Medications: rivaroxaban 20 mg once daily (last dose last night 11/23/21) ? ?Assessment: Pharmacy consulted for heparin infusion dosing and monitoring for 51 yo male admitted with SOB.  He has PMH of CHF, CKD, COPD, DM, HTN, and PE. ? ?Goal of Therapy:  ?Heparin level 0.3-0.7 units/ml ?aPTT 66 - 102 seconds ?Monitor platelets by anticoagulation protocol: Yes ? ?Date Time aPTT/HL Rate/Comment ?4/08 0810 29/ 0.18 Hep 2200 units/hr ?4/08 1557 78/---  Therapeutic x1  ?4/08 2131 88/--  Therapeutic x 2 ?4/09 0620 97/0.47 Therapeutic x 3 and correlating ? ?Plan:  ?Continue heparin drip at 2200 units/hr.  ?Check anti-xa level with AM labs ?Continue to monitor H&H and platelets daily while on heparin gtt. ? ?Thank you for allowing pharmacy to be a part of this patient?s care. ? ?Chinita Greenland PharmD ?Clinical Pharmacist ?11/25/2021 ? ? ? ? ? ?

## 2021-11-25 NOTE — Assessment & Plan Note (Addendum)
Glucose under control. ?

## 2021-11-25 NOTE — Assessment & Plan Note (Addendum)
Condition much improved, abdominal pain improved. ?

## 2021-11-25 NOTE — Assessment & Plan Note (Signed)
Blood pressure is stable 

## 2021-11-25 NOTE — Assessment & Plan Note (Signed)
Continue home medicines. ?

## 2021-11-25 NOTE — Progress Notes (Signed)
?Progress Note ? ? ?Patient: Gabriel [name]. [id number]:[id number] DOB: [birth year] DOA: 11/24/2021     1 ?DOS: the patient was seen and examined on 11/25/2021 ?  ?Brief hospital course: ?Gabriel [name]. is a 51 y.o. male with medical history significant of acalculous cholecystitis, hypertension, hyperlipidemia, diabetes mellitus, PE on Xarelto, CAD, mitral valve regurgitation, CHF with EF of 40-45%, CKD-3A, former smoker, OSA on CPAP, who presents with shortness of breath and abdominal pain. ?Patient had an episode of diarrhea on Wednesday, he was also nauseous, poor appetite.  He started have abdominal cramping pain since then.  CT scan and ultrasound suspect cholecystitis, HIDA scan has not cholecystitis. ?Patient is diagnosed with acute on chronic systolic congestive heart failure.  Was started on IV Lasix. ?  ? ?Assessment and Plan: ?Elevated troponin ?Secondary to exacerbation congestive heart failure. ? ?Abnormal LFTs ?Given new onset of diarrhea, nausea vomiting and abdominal cramping pain.  Will check acute hepatitis panel. ? ?Acalculous cholecystitis ?Ruled out.  ? ?SIRS (systemic inflammatory response syndrome) (HCC) ?Severe sepsis is ruled out. ?Patient has SIRS at the time of admission, so far no evidence of infection.  Acute cholecystitis has been ruled out.  Patient does not have any cough or recent upper respite infection to indicate pneumonia.  Procalcitonin level only minimally elevated which would be explained by patient renal function.  As result, sepsis is ruled out.  Discontinue all antibiotics. ? ?Gastroenteritis ?Patient had a diarrhea and nausea vomiting.  Most likely due to gastroenteritis.  Added Protonix and sucralfate. ? ?Pulmonary embolism ([place]) ?Patient has a history of PE, currently on heparin. ?I will continue another day, will transfer to oral Eliquis if no ischemia work-up on Monday per cardiology. ? ?CAD (coronary artery disease) ?Continue home medicines. ? ?OSA on CPAP ?Continue CPAP  while asleep. ? ?COPD (chronic obstructive pulmonary disease) ([place]) ?Stable without bronchospasm. ? ?Type II diabetes mellitus with renal manifestations ([place]) ?Continue current regimen. ? ?CKD (chronic kidney disease), stage IIIa ?Renal function stable. ? ?Acute on chronic combined systolic and diastolic CHF (congestive heart failure) ([place]) ?Reviewed prior echocardiogram performed on 05/2021, ejection fraction was 40 to 45%.  Repeat echocardiogram 4/8 showed a drastic reduction of ejection fraction to 20 to 25% with a moderate aortic valve regurgitation and diastolic dysfunction.  Peak troponin 59. ?Patient had a significant short of breath, elevated BNP at 1654.7.  Chest x-ray showed vascular congestion and interstitial edema.  Patient condition is consistent with acute on chronic congestive heart failure.  IV Lasix is started, will continue. ?Patient is also continued on Entresto, spironolactone, and Coreg. ?Due to rapid deterioration of left ventricular systolic dysfunction, will obtain cardiology consult. ? ? ?Essential hypertension ?Blood pressure is stable. ? ? ? ? ?  ? ?Subjective:  ?Patient still has significant short of breath with exertion, patient did not have any hypoxia. ?No chest pain.  No cough. ? ?Physical Exam: ?Vitals:  ? 11/25/21 0037 11/25/21 7564 11/25/21 0316 11/25/21 3329  ?BP: 127/79  135/89 (!) 134/94  ?Pulse: 75 70 76 76  ?Resp: '18  15 18  '$ ?Temp: (!) 97.4 ?F (36.3 ?C)  98 ?F (36.7 ?C) 97.7 ?F (36.5 ?C)  ?TempSrc:      ?SpO2: 97% 97% 100% 99%  ?Weight:   113.8 kg   ?Height:      ? ?General exam: Appears calm and comfortable  ?Respiratory system: Minimal crackles in the bases. Respiratory effort normal. ?Cardiovascular system: S1 & S2 heard, RRR. No JVD, murmurs,  rubs, gallops or clicks. No pedal edema. ?Gastrointestinal system: Abdomen is nondistended, soft and nontender. No organomegaly or masses felt. Normal bowel sounds heard. ?Central nervous system: Alert and oriented. No focal  neurological deficits. ?Extremities: Symmetric 5 x 5 power. ?Skin: No rashes, lesions or ulcers ?Psychiatry: Judgement and insight appear normal. Mood & affect appropriate.  ? ?Data Reviewed: ? ?Reviewed previous echocardiogram and current echocardiogram.  Reviewed chest x-rays, reviewed all labs ? ?Family Communication: Patient prefer not to talk to his father ? ?Disposition: ?Status is: Inpatient ?Remains inpatient appropriate because: Severity of disease, IV treatment ? Planned Discharge Destination: Home with Home Health ? ? ? ?Time spent: 35 minutes ? ?Author: ?Sharen Hones, MD ?11/25/2021 12:12 PM ? ?For on call review [email or web address].  ?

## 2021-11-26 ENCOUNTER — Ambulatory Visit: Payer: BC Managed Care – PPO | Admitting: Family

## 2021-11-26 DIAGNOSIS — I5043 Acute on chronic combined systolic (congestive) and diastolic (congestive) heart failure: Secondary | ICD-10-CM | POA: Diagnosis not present

## 2021-11-26 DIAGNOSIS — N1831 Chronic kidney disease, stage 3a: Secondary | ICD-10-CM

## 2021-11-26 DIAGNOSIS — J449 Chronic obstructive pulmonary disease, unspecified: Secondary | ICD-10-CM | POA: Diagnosis not present

## 2021-11-26 LAB — BASIC METABOLIC PANEL
Anion gap: 12 (ref 5–15)
BUN: 28 mg/dL — ABNORMAL HIGH (ref 6–20)
CO2: 26 mmol/L (ref 22–32)
Calcium: 8.9 mg/dL (ref 8.9–10.3)
Chloride: 102 mmol/L (ref 98–111)
Creatinine, Ser: 1.34 mg/dL — ABNORMAL HIGH (ref 0.61–1.24)
GFR, Estimated: >60 mL/min (ref >60)
Glucose, Bld: 124 mg/dL — ABNORMAL HIGH (ref 70–99)
Potassium: 3.7 mmol/L (ref 3.5–5.1)
Sodium: 140 mmol/L (ref 135–145)

## 2021-11-26 LAB — CBC
HCT: 51.9 % (ref 39.0–52.0)
Hemoglobin: 16.8 g/dL (ref 13.0–17.0)
MCH: 27.3 pg (ref 26.0–34.0)
MCHC: 32.4 g/dL (ref 30.0–36.0)
MCV: 84.3 fL (ref 80.0–100.0)
Platelets: 198 10*3/uL (ref 150–400)
RBC: 6.16 MIL/uL — ABNORMAL HIGH (ref 4.22–5.81)
RDW: 14.7 % (ref 11.5–15.5)
WBC: 9.2 10*3/uL (ref 4.0–10.5)
nRBC: 0 % (ref 0.0–0.2)

## 2021-11-26 LAB — PROCALCITONIN: Procalcitonin: 0.31 ng/mL

## 2021-11-26 LAB — GLUCOSE, CAPILLARY
Glucose-Capillary: 131 mg/dL — ABNORMAL HIGH (ref 70–99)
Glucose-Capillary: 126 mg/dL — ABNORMAL HIGH (ref 70–99)
Glucose-Capillary: 175 mg/dL — ABNORMAL HIGH (ref 70–99)
Glucose-Capillary: 185 mg/dL — ABNORMAL HIGH (ref 70–99)

## 2021-11-26 LAB — HEPARIN LEVEL (UNFRACTIONATED): Heparin Unfractionated: 0.34 IU/mL (ref 0.30–0.70)

## 2021-11-26 LAB — MAGNESIUM: Magnesium: 1.9 mg/dL (ref 1.7–2.4)

## 2021-11-26 MED ORDER — METHOCARBAMOL 500 MG PO TABS
500.0000 mg | ORAL_TABLET | Freq: Once | ORAL | Status: AC
  Administered 2021-11-26: 500 mg via ORAL
  Filled 2021-11-26: qty 1

## 2021-11-26 MED ORDER — MUSCLE RUB 10-15 % EX CREA
TOPICAL_CREAM | CUTANEOUS | Status: DC | PRN
  Filled 2021-11-26: qty 85

## 2021-11-26 MED ORDER — POTASSIUM CHLORIDE CRYS ER 20 MEQ PO TBCR
20.0000 meq | EXTENDED_RELEASE_TABLET | Freq: Once | ORAL | Status: AC
  Administered 2021-11-26: 20 meq via ORAL
  Filled 2021-11-26: qty 1

## 2021-11-26 MED ORDER — OXYCODONE-ACETAMINOPHEN 5-325 MG PO TABS
1.0000 | ORAL_TABLET | Freq: Four times a day (QID) | ORAL | Status: DC | PRN
  Administered 2021-11-26: 1 via ORAL
  Filled 2021-11-26: qty 1

## 2021-11-26 MED ORDER — DAPAGLIFLOZIN PROPANEDIOL 10 MG PO TABS
10.0000 mg | ORAL_TABLET | Freq: Every day | ORAL | Status: DC
  Administered 2021-11-26 – 2021-11-28 (×3): 10 mg via ORAL
  Filled 2021-11-26 (×3): qty 1

## 2021-11-26 MED ORDER — SENNOSIDES-DOCUSATE SODIUM 8.6-50 MG PO TABS
2.0000 | ORAL_TABLET | Freq: Two times a day (BID) | ORAL | Status: DC
  Administered 2021-11-26: 2 via ORAL
  Filled 2021-11-26 (×4): qty 2

## 2021-11-26 NOTE — Progress Notes (Signed)
?Progress Note ? ? ?Patient: Gabriel [name]. ZSW:[id number] DOB: 11/11/1970 DOA: 11/24/2021     2 ?DOS: the patient was seen and examined on 11/26/2021 ?  ?Brief hospital course: ?Gabriel [name]. is a 51 y.o. male with medical history significant of acalculous cholecystitis, hypertension, hyperlipidemia, diabetes mellitus, PE on Xarelto, CAD, mitral valve regurgitation, CHF with EF of 40-45%, CKD-3A, former smoker, OSA on CPAP, who presents with shortness of breath and abdominal pain. ?Patient had an episode of diarrhea on Wednesday, he was also nauseous, poor appetite.  He started have abdominal cramping pain since then.  CT scan and ultrasound suspect cholecystitis, HIDA scan has not cholecystitis. ?Patient is diagnosed with acute on chronic systolic congestive heart failure.  Was started on IV Lasix. ?  ? ?Assessment and Plan: ?Elevated troponin ?Secondary to exacerbation congestive heart failure.  Cardiology is considering outpatient work-up for ischemia. ? ?Abnormal LFTs ?Acute hepatitis panel negative, this appears to be secondary to congestive heart failure. ? ?Acalculous cholecystitis ?Ruled out.  ? ?SIRS (systemic inflammatory response syndrome) (HCC) ?Severe sepsis is ruled out. ?No evidence of bacterial infection. ? ?Gastroenteritis ?Condition much improved, abdominal pain improved. ? ?Pulmonary embolism ([place]) ?Patient has a history of PE, currently on heparin. ?Continue heparin, changed to Eliquis at the time of discharge. ? ?CAD (coronary artery disease) ?Continue home medicines. ? ?OSA on CPAP ?Continue CPAP while asleep. ? ?COPD (chronic obstructive pulmonary disease) ([place]) ?Stable without bronchospasm. ? ?Type II diabetes mellitus with renal manifestations ([place]) ?Glucose under control. ? ?CKD (chronic kidney disease), stage IIIa ?Stable ? ?Acute on chronic combined systolic and diastolic CHF (congestive heart failure) ([place]) ?Reviewed prior echocardiogram performed on 05/2021, ejection fraction  was 40 to 45%.  Repeat echocardiogram 4/8 showed a drastic reduction of ejection fraction to 20 to 25% with a moderate aortic valve regurgitation and diastolic dysfunction.  Peak troponin 59. ?Patient had a significant short of breath, elevated BNP at 1654.7.  Chest x-ray showed vascular congestion and interstitial edema.  Patient condition is consistent with acute on chronic congestive heart failure. Marland Kitchen ?Patient is also continued on Entresto, spironolactone, and Coreg.  Wilder Glade is also started by cardiology. ?Appreciate cardiology consult, consider possibility of ischemia work-up potentially as outpatient. ?I will continue another day of IV Lasix. ? ? ?Essential hypertension ?Blood pressure is stable. ? ? ? ? ?  ? ?Subjective:  ?Patient feels better today, still has some short of breath with version, he was able to walk in the hallway, but still feel weak. ?Abdominal pain much improved, no nausea vomiting, tolerating diet. ? ?Physical Exam: ?Vitals:  ? 11/26/21 0406 11/26/21 0459 11/26/21 0847 11/26/21 1149  ?BP: (!) 118/97  127/81 108/77  ?Pulse: 76  73 76  ?Resp: 16  18   ?Temp: 98.4 ?F (36.9 ?C)  98.3 ?F (36.8 ?C)   ?TempSrc:   Oral   ?SpO2: 95%  98% 94%  ?Weight:  107.8 kg    ?Height:      ? ?General exam: Appears calm and comfortable  ?Respiratory system: Clear to auscultation. Respiratory effort normal. ?Cardiovascular system: S1 & S2 heard, RRR. No JVD, murmurs, rubs, gallops or clicks. No pedal edema. ?Gastrointestinal system: Abdomen is nondistended, soft and nontender. No organomegaly or masses felt. Normal bowel sounds heard. ?Central nervous system: Alert and oriented. No focal neurological deficits. ?Extremities: Symmetric 5 x 5 power. ?Skin: No rashes, lesions or ulcers ?Psychiatry: Judgement and insight appear normal. Mood & affect appropriate.  ? ?Data Reviewed: ? ?  Lab results reviewed ? ?Family Communication:  ? ?Disposition: ?Status is: Inpatient ?Remains inpatient appropriate because: Severity of  disease, IV treatment. ? Planned Discharge Destination: Home with Home Health ? ? ? ?Time spent: 28 minutes ? ?Author: ?Sharen Hones, MD ?11/26/2021 1:21 PM ? ?For on call review [email or web address].  ?

## 2021-11-26 NOTE — Progress Notes (Signed)
? ? ?Progress Note ? ?Patient Name: Gabriel [name]. ?Date of Encounter: 11/26/2021 ? ?Primary Cardiologist: Rockey Situ ? ?Subjective  ? ?Dyspnea improving, though not at baseline.  Continues to note right upper quadrant discomfort.  No chest pain.  Documented urine output 4.6 L for the past 24 hours with a net -7.7 L for the admission.  Documented weight trend 113.8 to 107.8 kg over the past 24 hours.  Renal function and LFT improving with diuresis. ? ?Inpatient Medications  ?  ?Scheduled Meds: ? carvedilol  6.25 mg Oral BID WC  ? dapagliflozin propanediol  10 mg Oral Daily  ? ezetimibe  10 mg Oral Daily  ? fenofibrate  160 mg Oral Daily  ? furosemide  60 mg Intravenous BID  ? insulin aspart  0-5 Units Subcutaneous QHS  ? insulin aspart  0-9 Units Subcutaneous TID WC  ? insulin glargine-yfgn  20 Units Subcutaneous Q2200  ? pantoprazole  40 mg Oral BID AC  ? potassium chloride  20 mEq Oral BID  ? rosuvastatin  20 mg Oral QHS  ? sacubitril-valsartan  1 tablet Oral BID  ? spironolactone  25 mg Oral Daily  ? sucralfate  1 g Oral TID WC & HS  ? ?Continuous Infusions: ? heparin 2,200 Units/hr (11/26/21 5093)  ? ?PRN Meds: ?albuterol, dextromethorphan-guaiFENesin, hydrALAZINE, nitroGLYCERIN, ondansetron (ZOFRAN) IV  ? ?Vital Signs  ?  ?Vitals:  ? 11/26/21 0406 11/26/21 0459 11/26/21 0847 11/26/21 1149  ?BP: (!) 118/97  127/81 108/77  ?Pulse: 76  73 76  ?Resp: 16  18   ?Temp: 98.4 ?F (36.9 ?C)  98.3 ?F (36.8 ?C)   ?TempSrc:   Oral   ?SpO2: 95%  98% 94%  ?Weight:  107.8 kg    ?Height:      ? ? ?Intake/Output Summary (Last 24 hours) at 11/26/2021 1259 ?Last data filed at 11/26/2021 1028 ?Gross per 24 hour  ?Intake 1512.17 ml  ?Output 4775 ml  ?Net -3262.83 ml  ? ?Filed Weights  ? 11/24/21 0546 11/25/21 0316 11/26/21 0459  ?Weight: 111.1 kg 113.8 kg 107.8 kg  ? ? ?Telemetry  ?  ?SR, PACs, PVCs, isolated ventricular triplet - Personally Reviewed ? ?ECG  ?  ?No new tracings - Personally Reviewed ? ?Physical Exam  ? ?GEN: No acute  distress.   ?Neck: No JVD. ?Cardiac: RRR, II/VI systolic murmur LSB, no rubs, or gallops.  ?Respiratory: Mildly diminished at the bases bilaterally bilaterally.  ?GI: Soft, nontender, non-distended.   ?MS: No edema; No deformity. ?Neuro:  Alert and oriented x 3; Nonfocal.  ?Psych: Normal affect. ? ?Labs  ?  ?Chemistry ?Recent Labs  ?Lab 11/24/21 ?2671 11/25/21 ?2458 11/26/21 ?0998  ?NA 137 139 140  ?K 4.5 3.8 3.7  ?CL 106 104 102  ?CO2 20* 26 26  ?GLUCOSE 228* 137* 124*  ?BUN 29* 29* 28*  ?CREATININE 1.55* 1.47* 1.34*  ?CALCIUM 9.0 8.5* 8.9  ?PROT 7.1 6.3*  --   ?ALBUMIN 3.8 3.1*  --   ?AST 265* 140*  --   ?ALT 262* 251*  --   ?ALKPHOS 63 45  --   ?BILITOT 3.5* 2.0*  --   ?GFRNONAA 54* 57* >60  ?ANIONGAP '11 9 12  '$ ?  ? ?Hematology ?Recent Labs  ?Lab 11/24/21 ?3382 11/25/21 ?5053 11/26/21 ?9767  ?WBC 12.5* 10.1 9.2  ?RBC 6.28* 5.69 6.16*  ?HGB 17.1* 15.8 16.8  ?HCT 53.8* 48.6 51.9  ?MCV 85.7 85.4 84.3  ?MCH 27.2 27.8 27.3  ?MCHC 31.8  32.5 32.4  ?RDW 15.4 14.6 14.7  ?PLT 244 183 198  ? ? ?Cardiac EnzymesNo results for input(s): TROPONINI in the last 168 hours. No results for input(s): TROPIPOC in the last 168 hours.  ? ?BNP ?Recent Labs  ?Lab 11/24/21 ?1517  ?BNP 1,654.7*  ?  ? ?DDimer No results for input(s): DDIMER in the last 168 hours.  ? ?Radiology  ?  ?NM Hepatobiliary Liver Func ? ?Result Date: 11/24/2021 ?IMPRESSION: Patent cystic duct.  Patent common bile duct. Electronically Signed   By: Valentino Saxon M.D.   On: 11/24/2021 14:22  ? ?Cardiac Studies  ? ?2D echo 11/24/2021: ?1. Left ventricular ejection fraction, by estimation, is 20 to 25%. The  ?left ventricle has severely decreased function. The left ventricle  ?demonstrates global hypokinesis. The left ventricular internal cavity size  ?was moderately to severely dilated.  ?There is mild left ventricular hypertrophy. Left ventricular diastolic  ?parameters are consistent with Grade III diastolic dysfunction  ?(restrictive).  ? 2. Right ventricular systolic  function is severely reduced. The right  ?ventricular size is moderately enlarged.  ? 3. Left atrial size was mildly dilated.  ? 4. Right atrial size was mildly dilated.  ? 5. The mitral valve is grossly normal. Mild mitral valve regurgitation.  ? 6. The aortic valve is normal in structure. Aortic valve regurgitation is  ?moderate. No aortic stenosis is present. ?__________ ? ?2D echo 05/21/2021: ?1. Left ventricular ejection fraction, by estimation, is 40 to 45%. The  ?left ventricle has mildly decreased function. Left ventricular endocardial  ?border not optimally defined to evaluate regional wall motion. The left  ?ventricular internal cavity size  ?was mildly dilated. There is mild left ventricular hypertrophy. Left  ?ventricular diastolic parameters were normal.  ? 2. Right ventricular systolic function is normal. The right ventricular  ?size is normal. Tricuspid regurgitation signal is inadequate for assessing  ?PA pressure.  ? 3. Left atrial size was mild to moderately dilated.  ? 4. Right atrial size was mildly dilated.  ? 5. The mitral valve is normal in structure. Mild mitral valve  ?regurgitation. No evidence of mitral stenosis.  ? 6. The aortic valve is normal in structure. Aortic valve regurgitation is  ?mild. Mild to moderate aortic valve sclerosis/calcification is present,  ?without any evidence of aortic stenosis. ?__________ ? ?R/LHC 06/13/2020: ?Conclusions: ?Severe single-vessel coronary artery disease with chronic total occlusion of the proximal RCA.  The distal vessel fills via left to right collaterals. ?Mild to moderate, nonobstructive CAD involving the LAD and LCx with up to 50% stenosis. ?Mildly elevated left and right heart filling pressures. ?Moderate pulmonary hypertension (mean PAP 40 mmHg) with significantly elevated pulmonary vascular resistance (PVR 7.4 WU). ?Severely reduced Fick cardiac output/index. ?  ?Recommendations: ?Continue IV diuresis for at least 1 more day. ?Given severely  reduced cardiac output, I will decrease carvedilol to 6.5 mg twice daily.  If blood pressure tolerates and renal function is stable, consider further escalation of Entresto. ?Restart IV heparin 2 hours after TR band removal.  If no evidence of bleeding or vascular complication, the patient can be transitioned back to [facility] tomorrow. 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  He was evaluated by Dr. Aundra Dubin in June but failed to follow-up since then. ?Aggressive secondary prevention of coronary artery disease.  Favor medical management of CTO of RCA, which is new since 2018. ?__________ ? ?2D echo  06/08/2020: ? 1. Left ventricular ejection fraction, by estimation, is 35 to 40%. The  ?left ventricle has moderately decreased function. The left ventricle  ?demonstrates global hypokinesis. The left ventricular internal cavity size  ?was moderately dilated. Left  ?ventricular diastolic parameters are indeterminate.  ? 2. Right ventricular systolic function is normal. The right ventricular  ?size is normal. There is mildly elevated pulmonary artery systolic  ?pressure. The estimated right ventricular systolic pressure is 41.6 mmHg.  ? 3. Left atrial size was moderately dilated.  ? 4. Mild to moderate mitral valve regurgitation.  ?__________ ? ?2D echo 11/23/2019: ?1. Left ventricular ejection fraction, by estimation, is 25 to 30%. The  ?left ventricle has severely decreased function. The left ventricle  ?demonstrates global hypokinesis. The left ventricular internal cavity size  ?was mildly dilated. There is mild left  ?ventricular hypertrophy. Left ventricular diastolic parameters are  ?consistent with Grade III diastolic dysfunction (restrictive). Elevated  ?left atrial pressure.  ? 2. Right ventricular systolic function is moderately reduced. The right  ?ventricular size is mildly  enlarged. There is moderately elevated  ?pulmonary artery systolic pressure.  ? 3. Left atrial size was mild to moderately dilated.  ? 4. Right atrial size was mildly dilated.  ? 5. The mitral valve is n

## 2021-11-26 NOTE — Progress Notes (Signed)
Pt with 9/10 left leg cramps, refusing more lasix tonight d/t the severe pain, requesting some potassium in hopes that will alleviate the cramps. Dr. Roosevelt Locks notified. Orders received for pain med and another dose of potassium. ?

## 2021-11-26 NOTE — Progress Notes (Signed)
ANTICOAGULATION CONSULT NOTE ? ?Pharmacy Consult for heparin initiation & monitoring ?Indication: h/o pulmonary embolus ? ?Patient Measurements: ?Height: 6' (182.9 cm) ?Weight: 107.8 kg (237 lb 11.2 oz) ?IBW/kg (Calculated) : 77.6 ?Heparin Dosing Weight: 102 kg ? ?Vital Signs: ?Temp: 98.4 ?F (36.9 ?C) (04/10 0406) ?Temp Source: Oral (04/09 2024) ?BP: 118/97 (04/10 0406) ?Pulse Rate: 76 (04/10 0406) ? ?Labs: ?Recent Labs  ?  11/24/21 ?6761 11/24/21 ?9509 11/24/21 ?3267 11/24/21 ?1245 11/24/21 ?8099 11/24/21 ?0930 11/24/21 ?1557 11/24/21 ?2131 11/25/21 ?8338 11/26/21 ?2505  ?HGB 17.1*  --   --   --   --   --   --   --  15.8 16.8  ?HCT 53.8*  --   --   --   --   --   --   --  48.6 51.9  ?PLT 244  --   --   --   --   --   --   --  183 198  ?APTT  --   --   --  29   < >  --  78* 88* 97*  --   ?LABPROT  --   --   --  15.7*  --   --   --   --   --   --   ?INR  --   --   --  1.3*  --   --   --   --   --   --   ?HEPARINUNFRC  --   --  0.18*  --   --   --   --   --  0.47 0.34  ?CREATININE 1.55*  --   --   --   --   --   --   --  1.47* 1.34*  ?TROPONINIHS 50* 55*  --   --   --  59*  --   --   --   --   ? < > = values in this interval not displayed.  ? ? ? ?Estimated Creatinine Clearance: 81.8 mL/min (A) (by C-G formula based on SCr of 1.34 mg/dL (H)). ? ? ?Medical History: ?Past Medical History:  ?Diagnosis Date  ? CAD (coronary artery disease)   ? a. 04/2015 low risk MV;  b. 12/2016 Cath: minor irregs in LAD/Diag/LCX/OM, RCA 40p/m/d; c. 05/2020 Cath: LM nl, LAD 50d, LCX 30p, OM1 40, RCA 100p w/ L->L collats. CO/CI 3.1/1.3-->Med rx.  ? Chest wall pain, chronic   ? CHF (congestive heart failure) ([place])   ? Chronic Troponin Elevation   ? CKD (chronic kidney disease), stage II-III   ? COPD (chronic obstructive pulmonary disease) ([place])   ? Diabetes mellitus without complication ([place])   ? HFrEF (heart failure with reduced ejection fraction) ([place])   ? Hypertension   ? Mitral regurgitation   ? Mild to moderate by October 2021  echocardiogram.  ? Mixed Ischemic & NICM (nonischemic cardiomyopathy) ([place])   ? a. 03/2015 Echo: EF 45-50%; b. 12/2015 Echo: EF 20-25%; c. 02/2016 Echo: EF 30-35%; d. 11/2016 Echo: EF 40-45%; e. 06/2019 Echo: EF 30-35%; f. 11/2019 Echo: EF 25-30%; g. 05/2020 Echo: EF 35-40%, glob HK, nl RV size/fxn, PASP 44.22mHg, mod dil LA. Mild to mod MR.  ? Myocardial infarct ([facility]   ? NSVT (nonsustained ventricular tachycardia) ([place]   ? a. 12/2015 noted on tele-->amio;  b. 12/2015 Event monitor: no VT noted.  ? Obesity (BMI 30.0-34.9)   ? Psoriasis   ? Recurrent pulmonary emboli ([place] 06/07/2020  ?  06/07/20: small bilateral PEs.  12/31/19: RUL and RLL PEs.  ? Syncope   ? a. 01/2016 - felt to be vasovagal.  ? ? ?Medications: rivaroxaban 20 mg once daily (last dose last night 11/23/21) ? ?Assessment: Pharmacy consulted for heparin infusion dosing and monitoring for 51 yo male admitted with SOB.  He has PMH of CHF, CKD, COPD, DM, HTN, and PE. ? ?Goal of Therapy:  ?Heparin level 0.3-0.7 units/ml ?aPTT 66 - 102 seconds ?Monitor platelets by anticoagulation protocol: Yes ? ?Date Time aPTT/HL Rate/Comment ?4/08 0810 29/ 0.18 Hep 2200 units/hr ?4/08 1557 78/---  Therapeutic x1  ?4/08 2131 88/--  Therapeutic x 2 ?4/09 0620 97/0.47 Therapeutic x 3  ?4/10 0616 /0.34  Therapeutic x 4 ? ?Plan:  ?Heparin level is therapeutic. Will continue heparin infusion at 2200 units/hr. Recheck heparin level and CBC with AM labs.  ? ?Thank you for allowing pharmacy to be a part of this patient?s care. ? ?Eleonore Chiquito,  PharmD ?Clinical Pharmacist ?11/26/2021 ? ? ? ? ? ?

## 2021-11-26 NOTE — Consult Note (Signed)
? ?  Heart Failure Nurse Navigator Note ? ?HFrEF 20 to 25%.  Mild left ventricular hypertrophy.  Grade 3 diastolic dysfunction.  Right ventricular systolic function is severely reduced.  Mild biatrial enlargement.  Mild mitral regurgitation.  Moderate aortic insufficiency. ? ?He presented to the emergency room with complaints of increasing shortness of breath and abdominal pain.  He also states that he had noted a decrease in his appetite.  BNP was 1654.  Chest x-ray revealed vascular congestion and interstitial edema. ? ?Comorbidities: ? ?Hypertension ?Hyperlipidemia ?Diabetes ?Pulmonary embolus on NOAC ?Coronary artery disease ?Chronic kidney disease stage III ?History of tobacco abuse ?Obstructive sleep apnea on CPAP ? ?Medications: ? ?Coreg 6.25 mg twice a day with meals ?Farxiga 10 mg daily ?Zetia 10 mg daily ?Fenofibrate 160 mg daily ?Lasix 60 mg IV 2 times a day ?Potassium chloride 20 mEq 2 times a day ?Crestor 20 mg at bedtime ?Entresto 49/51 mg 2 times a day ?Spironolactone 25 mg daily ? ?Labs: ?Sodium 140, potassium 3.7, chloride 102, CO2 26, BUN 28, creatinine 1.34, GFR greater than 60 ?Intake 1512 mL ?Output 6150 mL ?Weight is 107.8 kg ?Blood pressure 108/77 ? ? ?Initial meeting with patient on this admission.  He had last been hospitalized on September 27, 2021. ? ?He voices frustration with the physician telling him that his hospitalization is due to dietary indiscretion and taking in too much fluid.  Upon questioning he cannot tell me how much liquid he drinks during the day, he states that he does have a large tumbler that he uses while he is at work but he is unsure if it is 24 ounces or 36 ounces.  Then he states he drinks tea and water at home in the evening but then again cannot tell me how much. ? ?He goes on to state that he feels that he is compliant with the diet but when you ask him about his meals he does admit to eating in restaurants i.e. [place] or a hamburger and fries. ? ?He also states  that he was weighing daily and when he would see his weight goes up he was using the metolazone and then the weight would go down.  But prior to this admission he was feeling so poorly that he was not weighing himself. ? ?He goes on to state that "he does not feel that it is anything that he does he just feels that his body is given up on him." ? ?Stressed the importance of sticking with the 64 ounce fluid restriction, daily weights and 2000 mg sodium restriction. ? ?He was given the living with heart failure teaching booklet.  I also spoke with the dietitian in the hall in regards to this patient and asked that she provide him some dietary instruction. ? ?He has an appointment in the outpatient heart failure clinic on April 21 at 10 AM.  He has a 14% ratio of no-show which is 26 out of 211 appointments. ? ?Pricilla Riffle RN [facility] ?

## 2021-11-26 NOTE — Evaluation (Signed)
Physical Therapy Evaluation ?Patient Details ?Name: Gabriel [name]. ?MRN: [id number] ?DOB: 12-26-1970 ?Today's Date: 11/26/2021 ? ?History of Present Illness ? Mattison Stuckey. is a 51 y.o. male with medical history significant of acalculous cholecystitis, hypertension, hyperlipidemia, diabetes mellitus, PE on Xarelto, CAD, mitral valve regurgitation, CHF with EF of 40-45%, CKD-3A, former smoker, OSA on CPAP, who presents with shortness of breath and abdominal pain. ?  ?Clinical Impression ? Patient received in bed, states he is doing okay. No significant sob at this time. Agrees to PT assessment. He states he walked around nurses station earlier. Patient is independent with bed mobility and transfers. Upon moving patient got cramp in left leg. He reports he gets cramps often. He is able to ambulate 165 feet pushing IV pole without difficulty other than pain from cramp. He is independent with mobility and does not need continued skilled PT. Signing off.      ? ?Recommendations for follow up therapy are one component of a multi-disciplinary discharge planning process, led by the attending physician.  Recommendations may be updated based on patient status, additional functional criteria and insurance authorization. ? ?Follow Up Recommendations No PT follow up ? ?  ?Assistance Recommended at Discharge None  ?Patient can return home with the following ?   ? ?  ?Equipment Recommendations None recommended by PT  ?Recommendations for Other Services ?    ?  ?Functional Status Assessment Patient has not had a recent decline in their functional status  ? ?  ?Precautions / Restrictions Precautions ?Precautions: None ?Restrictions ?Weight Bearing Restrictions: No  ? ?  ? ?Mobility ? Bed Mobility ?Overal bed mobility: Independent ?  ?  ?  ?  ?  ?  ?  ?  ? ?Transfers ?Overall transfer level: Independent ?  ?  ?  ?  ?  ?  ?  ?  ?  ?  ? ?Ambulation/Gait ?Ambulation/Gait assistance: Independent ?Gait Distance (Feet): 165  Feet ?Assistive device: IV Pole ?Gait Pattern/deviations: Step-through pattern ?Gait velocity: decr ?  ?  ?General Gait Details: patient independent with mobility pushing IV pole, limited by pain in left leg due to cramp. Otherwise, no difficulties. ? ?Stairs ?  ?  ?  ?  ?  ? ?Wheelchair Mobility ?  ? ?Modified Rankin (Stroke Patients Only) ?  ? ?  ? ?Balance Overall balance assessment: Independent ?  ?  ?  ?  ?  ?  ?  ?  ?  ?  ?  ?  ?  ?  ?  ?  ?  ?  ?   ? ? ? ?Pertinent Vitals/Pain Pain Assessment ?Pain Assessment: Faces ?Faces Pain Scale: Hurts even more ?Pain Location: L leg due to cramp ?Pain Descriptors / Indicators: Cramping, Discomfort ?Pain Intervention(s): Limited activity within patient's tolerance, Monitored during session  ? ? ?Home Living Family/patient expects to be discharged to:: Private residence ?  ?  ?  ?  ?  ?  ?  ?  ?Home Equipment: None ?   ?  ?Prior Function Prior Level of Function : Independent/Modified Independent ?  ?  ?  ?  ?  ?  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  ? ? ?Hand Dominance  ?   ? ?  ?Extremity/Trunk Assessment  ? Upper Extremity Assessment ?Upper Extremity Assessment: Overall WFL for tasks assessed ?  ? ?Lower Extremity Assessment ?Lower Extremity Assessment: Overall WFL for tasks assessed ?  ? ?Cervical /  Trunk Assessment ?Cervical / Trunk Assessment: Normal  ?Communication  ? Communication: No difficulties  ?Cognition Arousal/Alertness: Awake/alert ?Behavior During Therapy: [facility] for tasks assessed/performed ?Overall Cognitive Status: Within Functional Limits for tasks assessed ?  ?  ?  ?  ?  ?  ?  ?  ?  ?  ?  ?  ?  ?  ?  ?  ?  ?  ?  ? ?  ?General Comments   ? ?  ?Exercises    ? ?Assessment/Plan  ?  ?PT Assessment Patient does not need any further PT services  ?PT Problem List   ? ?   ?  ?PT Treatment Interventions     ? ?PT Goals (Current goals can be found in the Care Plan section)  ?Acute Rehab PT Goals ?Patient Stated Goal: to go  home tomorrow ?PT Goal Formulation: With patient ?Time For Goal Achievement: 11/27/21 ?Potential to Achieve Goals: Good ? ?  ?Frequency   ?  ? ? ?Co-evaluation   ?  ?  ?  ?  ? ? ?  ?AM-PAC PT "6 Clicks" Mobility  ?Outcome Measure Help needed turning from your back to your side while in a flat bed without using bedrails?: None ?Help needed moving from lying on your back to sitting on the side of a flat bed without using bedrails?: None ?Help needed moving to and from a bed to a chair (including a wheelchair)?: None ?Help needed standing up from a chair using your arms (e.g., wheelchair or bedside chair)?: None ?Help needed to walk in hospital room?: None ?Help needed climbing 3-5 steps with a railing? : None ?6 Click Score: 24 ? ?  ?End of Session   ?Activity Tolerance: Patient tolerated treatment well;Patient limited by pain ?Patient left: in bed;with call bell/phone within reach ?Nurse Communication: Mobility status ?  ?  ? ?Time: 0623-7628 ?PT Time Calculation (min) (ACUTE ONLY): 9 min ? ? ?Charges:   PT Evaluation ?$PT Eval Low Complexity: 1 Low ?  ?  ?   ? ? ?Amanda Cockayne, PT, GCS ?11/26/21,3:10 PM ? ? ?

## 2021-11-27 ENCOUNTER — Other Ambulatory Visit (HOSPITAL_COMMUNITY): Payer: Self-pay

## 2021-11-27 DIAGNOSIS — I2489 Other forms of acute ischemic heart disease: Secondary | ICD-10-CM

## 2021-11-27 DIAGNOSIS — J449 Chronic obstructive pulmonary disease, unspecified: Secondary | ICD-10-CM | POA: Diagnosis not present

## 2021-11-27 DIAGNOSIS — K529 Noninfective gastroenteritis and colitis, unspecified: Secondary | ICD-10-CM | POA: Diagnosis not present

## 2021-11-27 DIAGNOSIS — I5043 Acute on chronic combined systolic (congestive) and diastolic (congestive) heart failure: Secondary | ICD-10-CM | POA: Diagnosis not present

## 2021-11-27 DIAGNOSIS — I248 Other forms of acute ischemic heart disease: Secondary | ICD-10-CM

## 2021-11-27 LAB — CBC
HCT: 52.8 % — ABNORMAL HIGH (ref 39.0–52.0)
Hemoglobin: 17.3 g/dL — ABNORMAL HIGH (ref 13.0–17.0)
MCH: 27.6 pg (ref 26.0–34.0)
MCHC: 32.8 g/dL (ref 30.0–36.0)
MCV: 84.3 fL (ref 80.0–100.0)
Platelets: 230 10*3/uL (ref 150–400)
RBC: 6.26 MIL/uL — ABNORMAL HIGH (ref 4.22–5.81)
RDW: 15 % (ref 11.5–15.5)
WBC: 8.7 10*3/uL (ref 4.0–10.5)
nRBC: 0 % (ref 0.0–0.2)

## 2021-11-27 LAB — BASIC METABOLIC PANEL
Anion gap: 10 (ref 5–15)
BUN: 28 mg/dL — ABNORMAL HIGH (ref 6–20)
CO2: 23 mmol/L (ref 22–32)
Calcium: 9.2 mg/dL (ref 8.9–10.3)
Chloride: 102 mmol/L (ref 98–111)
Creatinine, Ser: 1.36 mg/dL — ABNORMAL HIGH (ref 0.61–1.24)
GFR, Estimated: >60 mL/min (ref >60)
Glucose, Bld: 157 mg/dL — ABNORMAL HIGH (ref 70–99)
Potassium: 4.7 mmol/L (ref 3.5–5.1)
Sodium: 135 mmol/L (ref 135–145)

## 2021-11-27 LAB — GLUCOSE, CAPILLARY
Glucose-Capillary: 121 mg/dL — ABNORMAL HIGH (ref 70–99)
Glucose-Capillary: 129 mg/dL — ABNORMAL HIGH (ref 70–99)
Glucose-Capillary: 162 mg/dL — ABNORMAL HIGH (ref 70–99)
Glucose-Capillary: 229 mg/dL — ABNORMAL HIGH (ref 70–99)

## 2021-11-27 LAB — HEPARIN LEVEL (UNFRACTIONATED): Heparin Unfractionated: 0.54 IU/mL (ref 0.30–0.70)

## 2021-11-27 LAB — LEGIONELLA PNEUMOPHILA SEROGP 1 UR AG: L. pneumophila Serogp 1 Ur Ag: NEGATIVE

## 2021-11-27 LAB — MAGNESIUM: Magnesium: 2 mg/dL (ref 1.7–2.4)

## 2021-11-27 MED ORDER — FUROSEMIDE 10 MG/ML IJ SOLN
40.0000 mg | Freq: Two times a day (BID) | INTRAMUSCULAR | Status: DC
  Administered 2021-11-27: 40 mg via INTRAVENOUS
  Filled 2021-11-27: qty 4

## 2021-11-27 MED ORDER — RIVAROXABAN 20 MG PO TABS
20.0000 mg | ORAL_TABLET | Freq: Every day | ORAL | Status: DC
  Administered 2021-11-27: 20 mg via ORAL
  Filled 2021-11-27: qty 1

## 2021-11-27 NOTE — Progress Notes (Signed)
?Progress Note ? ? ?Patient: Gabriel [name]. GLO:[id number] DOB: 04-14-1971 DOA: 11/24/2021     3 ?DOS: the patient was seen and examined on 11/27/2021 ?  ?Brief hospital course: ?Gabriel [name]. is a 51 y.o. male with medical history significant of acalculous cholecystitis, hypertension, hyperlipidemia, diabetes mellitus, PE on Xarelto, CAD, mitral valve regurgitation, CHF with EF of 40-45%, CKD-3A, former smoker, OSA on CPAP, who presents with shortness of breath and abdominal pain. ?Patient had an episode of diarrhea on Wednesday, he was also nauseous, poor appetite.  He started have abdominal cramping pain since then.  CT scan and ultrasound suspect cholecystitis, HIDA scan has not cholecystitis. ?Patient is diagnosed with acute on chronic systolic congestive heart failure.  Was started on IV Lasix. ?  ? ?Assessment and Plan: ?Elevated troponin ?Secondary to exacerbation congestive heart failure.  Cardiology is considering outpatient work-up for ischemia. ? ?Abnormal LFTs ?Acute hepatitis panel negative, this appears to be secondary to congestive heart failure. ? ?Acalculous cholecystitis ?Ruled out.  ? ?SIRS (systemic inflammatory response syndrome) (HCC) ?Severe sepsis is ruled out. ?No evidence of bacterial infection. ? ?Gastroenteritis ?Condition much improved, abdominal pain improved. ? ?Pulmonary embolism ([name]) ?Patient has a history of PE, continue anticoagulation with Xarelto. ? ?CAD (coronary artery disease) ?Continue home medicines. ? ?OSA on CPAP ?Continue CPAP while asleep. ? ?COPD (chronic obstructive pulmonary disease) ([place]) ?Condition stable. ? ?Type II diabetes mellitus with renal manifestations ([place]) ?Glucose under control. ? ?CKD (chronic kidney disease), stage IIIa ?Renal function still stable on diuretics ? ?Acute on chronic combined systolic and diastolic CHF (congestive heart failure) ([place]) ?Reviewed prior echocardiogram performed on 05/2021, ejection fraction was 40 to 45%.  Repeat  echocardiogram 4/8 showed a drastic reduction of ejection fraction to 20 to 25% with a moderate aortic valve regurgitation and diastolic dysfunction.  Peak troponin 59. ?Patient had a significant short of breath, elevated BNP at 1654.7.  Chest x-ray showed vascular congestion and interstitial edema.  Patient condition is consistent with acute on chronic congestive heart failure. Marland Kitchen ?Patient is also continued on Entresto, spironolactone, and Coreg.  Wilder Glade is also started by cardiology. ?Patient condition has been improving, he developed significant muscle spasm from Lasix.  Reduce IV Lasix to 40 mg twice a day. ?Patient is a followed by cardiology. ? ? ?Essential hypertension ?Blood pressure is stable. ? ? ? ? ?  ? ?Subjective:  ?Patient developed severe muscle cramps after giving Lasix.  Short of breath gradually improving. ?Abdominal pain much improved.  No nausea vomiting, he had 2 or 3 bowel movement last night. ? ?Physical Exam: ?Vitals:  ? 11/26/21 2344 11/27/21 0419 11/27/21 0750 11/27/21 1204  ?BP: 116/77 (!) 101/57 111/78 106/68  ?Pulse: 70 72 71 67  ?Resp: '12 20 16 16  '$ ?Temp: 97.8 ?F (36.6 ?C) 97.9 ?F (36.6 ?C) 97.6 ?F (36.4 ?C) 97.8 ?F (36.6 ?C)  ?TempSrc: Oral Oral    ?SpO2: 98% 96% 98% 94%  ?Weight:  106 kg    ?Height:      ? ?General exam: Appears calm and comfortable  ?Respiratory system: Clear to auscultation. Respiratory effort normal. ?Cardiovascular system: S1 & S2 heard, RRR. No JVD, murmurs, rubs, gallops or clicks. No pedal edema. ?Gastrointestinal system: Abdomen is nondistended, soft and nontender. No organomegaly or masses felt. Normal bowel sounds heard. ?Central nervous system: Alert and oriented. No focal neurological deficits. ?Extremities: Symmetric 5 x 5 power. ?Skin: No rashes, lesions or ulcers ?Psychiatry: Judgement and insight appear normal. Mood &  affect appropriate.  ? ?Data Reviewed: ? ?Lab results reviewed. ? ?Family Communication: Prefer not to talk to his  father. ? ?Disposition: ?Status is: Inpatient ?Remains inpatient appropriate because: Severity of disease, IV Lasix ? Planned Discharge Destination: Home with Home Health ? ? ? ?Time spent: 26 minutes ? ?Author: ?Sharen Hones, MD ?11/27/2021 2:10 PM ? ?For on call review [email or web address].  ?

## 2021-11-27 NOTE — TOC Benefit Eligibility Note (Signed)
Patient Advocate Encounter ? ?Insurance verification completed.   ? ?The patient is currently admitted and upon discharge could be taking Farxiga 10 mg. ? ?The current 30 day co-pay is, $108.85.  ? ?The patient is insured through [employer]  ? ? ? ?Lyndel Safe, CPhT ?Pharmacy Patient Advocate Specialist ?[facility] Patient Advocate Team ?Direct Number: [phone]  Fax: [phone] ? ? ? ? ? ?  ?

## 2021-11-27 NOTE — Progress Notes (Signed)
ANTICOAGULATION CONSULT NOTE ? ?Pharmacy Consult for heparin initiation & monitoring ?Indication: h/o pulmonary embolus ? ?Patient Measurements: ?Height: 6' (182.9 cm) ?Weight: 106 kg (233 lb 11 oz) ?IBW/kg (Calculated) : 77.6 ?Heparin Dosing Weight: 102 kg ? ?Vital Signs: ?Temp: 97.6 ?F (36.4 ?C) (04/11 0750) ?Temp Source: Oral (04/11 0419) ?BP: 111/78 (04/11 0750) ?Pulse Rate: 71 (04/11 0750) ? ?Labs: ?Recent Labs  ?  11/24/21 ?0930 11/24/21 ?1557 11/24/21 ?2131 11/25/21 ?0086 11/25/21 ?7619 11/26/21 ?5093 11/27/21 ?2671  ?HGB  --   --   --  15.8   < > 16.8 17.3*  ?HCT  --   --   --  48.6  --  51.9 52.8*  ?PLT  --   --   --  183  --  198 230  ?APTT  --  78* 88* 97*  --   --   --   ?HEPARINUNFRC  --   --   --  0.47  --  0.34 0.54  ?CREATININE  --   --   --  1.47*  --  1.34*  --   ?TROPONINIHS 59*  --   --   --   --   --   --   ? < > = values in this interval not displayed.  ? ? ? ?Estimated Creatinine Clearance: 81.2 mL/min (A) (by C-G formula based on SCr of 1.34 mg/dL (H)). ? ? ?Medical History: ?Past Medical History:  ?Diagnosis Date  ? CAD (coronary artery disease)   ? a. 04/2015 low risk MV;  b. 12/2016 Cath: minor irregs in LAD/Diag/LCX/OM, RCA 40p/m/d; c. 05/2020 Cath: LM nl, LAD 50d, LCX 30p, OM1 40, RCA 100p w/ L->L collats. CO/CI 3.1/1.3-->Med rx.  ? Chest wall pain, chronic   ? CHF (congestive heart failure) ([place])   ? Chronic Troponin Elevation   ? CKD (chronic kidney disease), stage II-III   ? COPD (chronic obstructive pulmonary disease) ([place])   ? Diabetes mellitus without complication ([place])   ? HFrEF (heart failure with reduced ejection fraction) ([place])   ? Hypertension   ? Mitral regurgitation   ? Mild to moderate by October 2021 echocardiogram.  ? Mixed Ischemic & NICM (nonischemic cardiomyopathy) ([place])   ? a. 03/2015 Echo: EF 45-50%; b. 12/2015 Echo: EF 20-25%; c. 02/2016 Echo: EF 30-35%; d. 11/2016 Echo: EF 40-45%; e. 06/2019 Echo: EF 30-35%; f. 11/2019 Echo: EF 25-30%; g. 05/2020 Echo: EF 35-40%, glob HK,  nl RV size/fxn, PASP 44.62mHg, mod dil LA. Mild to mod MR.  ? Myocardial infarct ([facility]   ? NSVT (nonsustained ventricular tachycardia) ([place]   ? a. 12/2015 noted on tele-->amio;  b. 12/2015 Event monitor: no VT noted.  ? Obesity (BMI 30.0-34.9)   ? Psoriasis   ? Recurrent pulmonary emboli ([place] 06/07/2020  ? 06/07/20: small bilateral PEs.  12/31/19: RUL and RLL PEs.  ? Syncope   ? a. 01/2016 - felt to be vasovagal.  ? ? ?Medications: rivaroxaban 20 mg once daily (last dose last night 11/23/21) ? ?Assessment: Pharmacy consulted for heparin infusion dosing and monitoring for 51yo male admitted with SOB.  He has PMH of CHF, CKD, COPD, DM, HTN, and PE. ? ?Goal of Therapy:  ?Heparin level 0.3-0.7 units/ml ?aPTT 66 - 102 seconds ?Monitor platelets by anticoagulation protocol: Yes ? ?Date Time aPTT/HL Rate/Comment ?4/08 0810 29/ 0.18 Hep 2200 units/hr ?4/08 1557 78/---  Therapeutic x1  ?4/08 2131 88/--  Therapeutic x 2 ?4/09 0620 97/0.47 Therapeutic x 3  ?4/10 0616 /  0.34  Therapeutic x 4 ?4/11 0629 0.54  Therapeutic.  ? ?Plan:  ?Heparin level is therapeutic. Will continue heparin infusion at 2200 units/hr. Recheck heparin level and CBC with AM labs.  ? ?Thank you for allowing pharmacy to be a part of this patient?s care. ? ?Eleonore Chiquito,  PharmD ?Clinical Pharmacist ?11/27/2021 ? ? ? ? ? ?

## 2021-11-27 NOTE — Progress Notes (Signed)
? ? ?Progress Note ? ?Patient Name: Gabriel [name]. ?Date of Encounter: 11/27/2021 ? ?Primary Cardiologist: Rockey Situ ? ?Subjective  ? ?Dyspnea and abdominal discomfort are improving. He has noted muscle spasms along his abdomen, leading his Lasix to be reduced.  No chest pain, palpitations or dizziness.  Documented urine output 800 mL for the past 24 hours with a net -9.2 L for the admission.  Documented weight trend 113.8 to 107.8 to 106 kg over the past 48 hours.  Renal function improving to stable with diuresis. ? ?Inpatient Medications  ?  ?Scheduled Meds: ? carvedilol  6.25 mg Oral BID WC  ? dapagliflozin propanediol  10 mg Oral Daily  ? ezetimibe  10 mg Oral Daily  ? fenofibrate  160 mg Oral Daily  ? furosemide  40 mg Intravenous BID  ? insulin aspart  0-5 Units Subcutaneous QHS  ? insulin aspart  0-9 Units Subcutaneous TID WC  ? insulin glargine-yfgn  20 Units Subcutaneous Q2200  ? pantoprazole  40 mg Oral BID AC  ? potassium chloride  20 mEq Oral BID  ? rivaroxaban  20 mg Oral Q supper  ? rosuvastatin  20 mg Oral QHS  ? sacubitril-valsartan  1 tablet Oral BID  ? senna-docusate  2 tablet Oral BID  ? spironolactone  25 mg Oral Daily  ? sucralfate  1 g Oral TID WC & HS  ? ?Continuous Infusions: ? ? ?PRN Meds: ?albuterol, dextromethorphan-guaiFENesin, hydrALAZINE, Muscle Rub, nitroGLYCERIN, ondansetron (ZOFRAN) IV, oxyCODONE-acetaminophen  ? ?Vital Signs  ?  ?Vitals:  ? 11/26/21 2344 11/27/21 0419 11/27/21 0750 11/27/21 1204  ?BP: 116/77 (!) 101/57 111/78 106/68  ?Pulse: 70 72 71 67  ?Resp: '12 20 16 16  '$ ?Temp: 97.8 ?F (36.6 ?C) 97.9 ?F (36.6 ?C) 97.6 ?F (36.4 ?C) 97.8 ?F (36.6 ?C)  ?TempSrc: Oral Oral    ?SpO2: 98% 96% 98% 94%  ?Weight:  106 kg    ?Height:      ? ? ?Intake/Output Summary (Last 24 hours) at 11/27/2021 1420 ?Last data filed at 11/27/2021 1200 ?Gross per 24 hour  ?Intake 1334.68 ml  ?Output 2850 ml  ?Net -1515.32 ml  ? ? ?Filed Weights  ? 11/25/21 0316 11/26/21 0459 11/27/21 0419  ?Weight: 113.8 kg  107.8 kg 106 kg  ? ? ?Telemetry  ?  ?SR, 24 beats of NSVT- Personally Reviewed ? ?ECG  ?  ?No new tracings - Personally Reviewed ? ?Physical Exam  ? ?GEN: No acute distress.   ?Neck: No JVD. ?Cardiac: RRR, II/VI systolic murmur LSB, no rubs, or gallops.  ?Respiratory: CTAB.  ?GI: Soft, nontender, non-distended.   ?MS: No edema; No deformity. ?Neuro:  Alert and oriented x 3; Nonfocal.  ?Psych: Normal affect. ? ?Labs  ?  ?Chemistry ?Recent Labs  ?Lab 11/24/21 ?7619 11/25/21 ?5093 11/26/21 ?2671 11/27/21 ?2458  ?NA 137 139 140 135  ?K 4.5 3.8 3.7 4.7  ?CL 106 104 102 102  ?CO2 20* '26 26 23  '$ ?GLUCOSE 228* 137* 124* 157*  ?BUN 29* 29* 28* 28*  ?CREATININE 1.55* 1.47* 1.34* 1.36*  ?CALCIUM 9.0 8.5* 8.9 9.2  ?PROT 7.1 6.3*  --   --   ?ALBUMIN 3.8 3.1*  --   --   ?AST 265* 140*  --   --   ?ALT 262* 251*  --   --   ?ALKPHOS 63 45  --   --   ?BILITOT 3.5* 2.0*  --   --   ?GFRNONAA 54* 57* >60 >60  ?  ANIONGAP '11 9 12 10  '$ ? ?  ? ?Hematology ?Recent Labs  ?Lab 11/25/21 ?0620 11/26/21 ?1275 11/27/21 ?1700  ?WBC 10.1 9.2 8.7  ?RBC 5.69 6.16* 6.26*  ?HGB 15.8 16.8 17.3*  ?HCT 48.6 51.9 52.8*  ?MCV 85.4 84.3 84.3  ?MCH 27.8 27.3 27.6  ?MCHC 32.5 32.4 32.8  ?RDW 14.6 14.7 15.0  ?PLT 183 198 230  ? ? ? ?Cardiac EnzymesNo results for input(s): TROPONINI in the last 168 hours. No results for input(s): TROPIPOC in the last 168 hours.  ? ?BNP ?Recent Labs  ?Lab 11/24/21 ?1749  ?BNP 1,654.7*  ? ?  ? ?DDimer No results for input(s): DDIMER in the last 168 hours.  ? ?Radiology  ?  ?NM Hepatobiliary Liver Func ? ?Result Date: 11/24/2021 ?IMPRESSION: Patent cystic duct.  Patent common bile duct. Electronically Signed   By: Valentino Saxon M.D.   On: 11/24/2021 14:22  ? ?Cardiac Studies  ? ?2D echo 11/24/2021: ?1. Left ventricular ejection fraction, by estimation, is 20 to 25%. The  ?left ventricle has severely decreased function. The left ventricle  ?demonstrates global hypokinesis. The left ventricular internal cavity size  ?was moderately to  severely dilated.  ?There is mild left ventricular hypertrophy. Left ventricular diastolic  ?parameters are consistent with Grade III diastolic dysfunction  ?(restrictive).  ? 2. Right ventricular systolic function is severely reduced. The right  ?ventricular size is moderately enlarged.  ? 3. Left atrial size was mildly dilated.  ? 4. Right atrial size was mildly dilated.  ? 5. The mitral valve is grossly normal. Mild mitral valve regurgitation.  ? 6. The aortic valve is normal in structure. Aortic valve regurgitation is  ?moderate. No aortic stenosis is present. ?__________ ? ?2D echo 05/21/2021: ?1. Left ventricular ejection fraction, by estimation, is 40 to 45%. The  ?left ventricle has mildly decreased function. Left ventricular endocardial  ?border not optimally defined to evaluate regional wall motion. The left  ?ventricular internal cavity size  ?was mildly dilated. There is mild left ventricular hypertrophy. Left  ?ventricular diastolic parameters were normal.  ? 2. Right ventricular systolic function is normal. The right ventricular  ?size is normal. Tricuspid regurgitation signal is inadequate for assessing  ?PA pressure.  ? 3. Left atrial size was mild to moderately dilated.  ? 4. Right atrial size was mildly dilated.  ? 5. The mitral valve is normal in structure. Mild mitral valve  ?regurgitation. No evidence of mitral stenosis.  ? 6. The aortic valve is normal in structure. Aortic valve regurgitation is  ?mild. Mild to moderate aortic valve sclerosis/calcification is present,  ?without any evidence of aortic stenosis. ?__________ ? ?R/LHC 06/13/2020: ?Conclusions: ?Severe single-vessel coronary artery disease with chronic total occlusion of the proximal RCA.  The distal vessel fills via left to right collaterals. ?Mild to moderate, nonobstructive CAD involving the LAD and LCx with up to 50% stenosis. ?Mildly elevated left and right heart filling pressures. ?Moderate pulmonary hypertension (mean PAP 40  mmHg) with significantly elevated pulmonary vascular resistance (PVR 7.4 WU). ?Severely reduced Fick cardiac output/index. ?  ?Recommendations: ?Continue IV diuresis for at least 1 more day. ?Given severely reduced cardiac output, I will decrease carvedilol to 6.5 mg twice daily.  If blood pressure tolerates and renal function is stable, consider further escalation of Entresto. ?Restart IV heparin 2 hours after TR band removal.  If no evidence of bleeding or vascular complication, the patient can be transitioned back to [facility] tomorrow. ?Strongly encourage patient to follow-up with  the advanced heart failure clinic given his significant mixed ischemic and nonischemic cardiomyopathy and severe pulmonary hypertension.  He was evaluated by Dr. Aundra Dubin in June but failed to follow-up since then. ?Aggressive secondary prevention of coronary artery disease.  Favor medical management of CTO of RCA, which is new since 2018. ?__________ ? ?2D echo 06/08/2020: ? 1. Left ventricular ejection fraction, by estimation, is 35 to 40%. The  ?left ventricle has moderately decreased function. The left ventricle  ?demonstrates global hypokinesis. The left ventricular internal cavity size  ?was moderately dilated. Left  ?ventricular diastolic parameters are indeterminate.  ? 2. Right ventricular systolic function is normal. The right ventricular  ?size is normal. There is mildly elevated pulmonary artery systolic  ?pressure. The estimated right ventricular systolic pressure is 16.1 mmHg.  ? 3. Left atrial size was moderately dilated.  ? 4. Mild to moderate mitral valve regurgitation.  ?__________ ? ?2D echo 11/23/2019: ?1. Left ventricular ejection fraction, by estimation, is 25 to 30%. The  ?left ventricle has severely decreased function. The left ventricle  ?demonstrates global hypokinesis. The left ventricular internal cavity size  ?was mildly dilated. There is mild left  ?ventricular hypertrophy. Left ventricular diastolic parameters are   ?consistent with Grade III diastolic dysfunction (restrictive). Elevated  ?left atrial pressure.  ? 2. Right ventricular systolic function is moderately reduced. The right  ?ventricular size is mildl

## 2021-11-28 ENCOUNTER — Encounter: Payer: Self-pay | Admitting: Internal Medicine

## 2021-11-28 ENCOUNTER — Other Ambulatory Visit: Payer: Self-pay | Admitting: Nurse Practitioner

## 2021-11-28 DIAGNOSIS — N183 Chronic kidney disease, stage 3 unspecified: Secondary | ICD-10-CM

## 2021-11-28 DIAGNOSIS — I2510 Atherosclerotic heart disease of native coronary artery without angina pectoris: Secondary | ICD-10-CM

## 2021-11-28 LAB — CBC
HCT: 55.7 % — ABNORMAL HIGH (ref 39.0–52.0)
Hemoglobin: 18.1 g/dL — ABNORMAL HIGH (ref 13.0–17.0)
MCH: 27.7 pg (ref 26.0–34.0)
MCHC: 32.5 g/dL (ref 30.0–36.0)
MCV: 85.3 fL (ref 80.0–100.0)
Platelets: 235 10*3/uL (ref 150–400)
RBC: 6.53 MIL/uL — ABNORMAL HIGH (ref 4.22–5.81)
RDW: 15.2 % (ref 11.5–15.5)
WBC: 8.5 10*3/uL (ref 4.0–10.5)
nRBC: 0 % (ref 0.0–0.2)

## 2021-11-28 LAB — COMPREHENSIVE METABOLIC PANEL
ALT: 332 U/L — ABNORMAL HIGH (ref 0–44)
AST: 151 U/L — ABNORMAL HIGH (ref 15–41)
Albumin: 3.6 g/dL (ref 3.5–5.0)
Alkaline Phosphatase: 51 U/L (ref 38–126)
Anion gap: 12 (ref 5–15)
BUN: 40 mg/dL — ABNORMAL HIGH (ref 6–20)
CO2: 27 mmol/L (ref 22–32)
Calcium: 9.6 mg/dL (ref 8.9–10.3)
Chloride: 98 mmol/L (ref 98–111)
Creatinine, Ser: 1.81 mg/dL — ABNORMAL HIGH (ref 0.61–1.24)
GFR, Estimated: 44 mL/min — ABNORMAL LOW (ref >60)
Glucose, Bld: 151 mg/dL — ABNORMAL HIGH (ref 70–99)
Potassium: 4.5 mmol/L (ref 3.5–5.1)
Sodium: 137 mmol/L (ref 135–145)
Total Bilirubin: 1.6 mg/dL — ABNORMAL HIGH (ref 0.3–1.2)
Total Protein: 7.5 g/dL (ref 6.5–8.1)

## 2021-11-28 LAB — GLUCOSE, CAPILLARY
Glucose-Capillary: 161 mg/dL — ABNORMAL HIGH (ref 70–99)
Glucose-Capillary: 177 mg/dL — ABNORMAL HIGH (ref 70–99)

## 2021-11-28 LAB — MAGNESIUM: Magnesium: 2.2 mg/dL (ref 1.7–2.4)

## 2021-11-28 MED ORDER — SPIRONOLACTONE 25 MG PO TABS: 25.0000 mg | ORAL_TABLET | Freq: Every day | ORAL | 0 refills | Status: DC

## 2021-11-28 MED ORDER — TORSEMIDE 40 MG PO TABS: 40.0000 mg | ORAL_TABLET | Freq: Two times a day (BID) | ORAL | 0 refills | Status: DC

## 2021-11-28 MED ORDER — TORSEMIDE 20 MG PO TABS: 40.0000 mg | ORAL_TABLET | Freq: Two times a day (BID) | ORAL | Status: DC

## 2021-11-28 MED ORDER — SACUBITRIL-VALSARTAN 49-51 MG PO TABS: 1.0000 | ORAL_TABLET | Freq: Two times a day (BID) | ORAL | Status: DC

## 2021-11-28 MED ORDER — DAPAGLIFLOZIN PROPANEDIOL 10 MG PO TABS: 10.0000 mg | ORAL_TABLET | Freq: Every day | ORAL | 0 refills | Status: DC

## 2021-11-28 NOTE — Consult Note (Signed)
Spoke to patient about farxiga. Per patient, it may be held due to renal function. Gave patient coupon (30-days and 0 dollar co-pay card).  ? ?Thanks,  ?Eleonore Chiquito, PharmD,  ?

## 2021-11-28 NOTE — Discharge Summary (Addendum)
?Physician Discharge Summary ?  ?Patient: Gabriel [name]. MRN: [id number] DOB: [birth year]  ?Admit date:     11/24/2021  ?Discharge date: 11/28/2021  ?Discharge Physician: Jennye Boroughs  ? ?PCP: [name], FNP  ? ?Recommendations at discharge:  ? ? ?Check kidney function/BMP on 11/30/2021 ?Follow-up with cardiologist on 12/06/2021 ? ?Discharge Diagnoses: ?Principal Problem: ?  Acute on chronic combined systolic and diastolic CHF (congestive heart failure) ([place]) ?Active Problems: ?  Elevated troponin ?  Chest pain ?  Acalculous cholecystitis ?  Abnormal LFTs ?  Essential hypertension ?  Mixed hyperlipidemia ?  CKD (chronic kidney disease), stage IIIa ?  History of pulmonary embolism ?  Type II diabetes mellitus with renal manifestations ([place]) ?  COPD (chronic obstructive pulmonary disease) ([place]) ?  Acute kidney injury ([place]) ?  OSA on CPAP ?  CAD (coronary artery disease) ?  Gastroenteritis ?  SIRS (systemic inflammatory response syndrome) (HCC) ?  Demand ischemia ([place]) ? ?Resolved Problems: ?  * No resolved hospital problems. * ? ?Hospital Course: ?Gabriel [name]. is a 51 y.o. male with medical history significant of acalculous cholecystitis, hypertension, hyperlipidemia, diabetes mellitus, PE on Xarelto, CAD, mitral valve regurgitation, CHF with EF of 40-45%, CKD-3A, former smoker, OSA on CPAP, who presented to the hospital with shortness of breath and abdominal pain. ?He had an episode of of diarrhea a few days prior to admission.  He also complained of nausea and poor appetite.  Subsequently, he developed abdominal pain/cramping.  CT scan of the abdomen and liver ultrasound were concerning for cholecystitis.  However, HIDA scan did not show any evidence of cholecystitis.  ? ?He was found to have acute on chronic and diastolic systolic CHF with abdominal distention.  He was treated with IV Lasix.  He was subsequently switched to torsemide.  He was also started on spironolactone.  He developed AKI on CKD stage  IIIa.  Baseline creatinine is likely around 1.3 to 1.5.  Patient was advised to stay an additional day in the hospital in order to monitor creatinine/kidney function.  However, patient was adamant about going home today.  He prefers to follow-up as an outpatient.  He is deemed stable for discharge to home today.  Discharge plan was discussed with cardiologist, Gabriel [name]. ?  ? ?  ? ? ?Consultants: Cardiologist  ?Procedures performed: None ?Disposition: Home ?Diet recommendation:  ?Discharge Diet Orders (From admission, onward)  ? ?  Start     Ordered  ? 11/28/21 0000  Diet - low sodium heart healthy       ? 11/28/21 1215  ? 11/28/21 0000  Diet Carb Modified       ? 11/28/21 1215  ? ?  ?  ? ?  ? ?Cardiac and Carb modified diet ?DISCHARGE MEDICATION: ?Allergies as of 11/28/2021   ? ?   Reactions  ? Metformin And Related Nausea And Vomiting  ? Prednisone Other (See Comments)  ? Reaction: Hallucinations   ? Zantac [ranitidine Hcl] Diarrhea, Nausea Only  ? Night sweats  ? ?  ? ?  ?Medication List  ?  ? ?TAKE these medications   ? ?Basaglar KwikPen 100 UNIT/ML ?Inject 28 Units into the skin at bedtime. ?  ?carvedilol 6.25 MG tablet ?Commonly known as: COREG ?Take 1 tablet (6.25 mg total) by mouth 2 (two) times daily with a meal. ?  ?Combivent Respimat 20-100 MCG/ACT Aers respimat ?Generic drug: Ipratropium-Albuterol ?Inhale 1 puff into the lungs every 6 (six) hours as  needed for wheezing. ?  ?dapagliflozin propanediol 10 MG Tabs tablet ?Commonly known as: FARXIGA ?Take 1 tablet (10 mg total) by mouth daily. ?Start taking on: November 29, 2021 ?  ?ezetimibe 10 MG tablet ?Commonly known as: ZETIA ?Take 1 tablet (10 mg total) by mouth daily. ?  ?fenofibrate 145 MG tablet ?Commonly known as: TRICOR ?Take 1 tablet (145 mg total) by mouth daily. ?  ?FreeStyle [place] 2 Reader Kerrin Mo ?Use to monitor blood sugars as directed ?  ?FreeStyle Libre 2 Sensor Misc ?Use to monitor blood sugars as directed ?  ?insulin lispro 100 UNIT/ML  KwikPen ?Commonly known as: HUMALOG ?Inject 3 Units into the skin 3 (three) times daily. ?  ?metolazone 5 MG tablet ?Commonly known as: ZAROXOLYN ?TAKE 1 TABLET (5 MG TOTAL) BY MOUTH DAILY AS NEEDED (FOR WEIGHT OVER 245). ?  ?nitroGLYCERIN 0.4 MG SL tablet ?Commonly known as: NITROSTAT ?Place 1 tablet (0.4 mg total) under the tongue every 5 (five) minutes x 3 doses as needed for chest pain. ?  ?Pen Needles 32G X 4 MM Misc ?200 each by Does not apply route at bedtime. ?  ?potassium chloride SA 20 MEQ tablet ?Commonly known as: Klor-Con M20 ?Take 1 tablet daily ?  ?rivaroxaban 20 MG Tabs tablet ?Commonly known as: XARELTO ?Take 1 tablet (20 mg total) by mouth daily with supper. ?  ?rosuvastatin 20 MG tablet ?Commonly known as: CRESTOR ?Take 1 tablet (20 mg total) by mouth daily. ?  ?sacubitril-valsartan 24-26 MG ?Commonly known as: ENTRESTO ?Take 1 tablet by mouth 2 (two) times daily. ?  ?sildenafil 100 MG tablet ?Commonly known as: VIAGRA ?Take 1 tablets 1 hour prior to intercourse. ?  ?spironolactone 25 MG tablet ?Commonly known as: ALDACTONE ?Take 1 tablet (25 mg total) by mouth daily. ?Start taking on: November 29, 2021 ?  ?Torsemide 40 MG Tabs ?Take 40 mg by mouth 2 (two) times daily. ?Start taking on: November 29, 2021 ?What changed:  ?medication strength ?how much to take ?when to take this ?  ?Trulicity 1.5 HE/5.2DP Sopn ?Generic drug: Dulaglutide ?Inject 1.5 mg into the skin once a week. ?  ? ?  ? ? Follow-up Information   ? ? Minna Merritts, MD Follow up on 11/30/2021.   ?Specialty: Cardiology ?Why: LAB ONLY (blood chemistry) - 10 AM ?Contact information: ?[address] ?[place] [address] ?[phone] ? ? ?  ?  ? ? Theora Gianotti, NP Follow up on 12/06/2021.   ?Specialties: Nurse Practitioner, Cardiology, Radiology ?Why: 10:55 AM ?Contact information: ?[address] ?[address] ?[place] [address] ?[phone] ? ? ?  ?  ? ?  ?  ? ?  ? ?Discharge Exam: ?Filed Weights  ? 11/26/21  0459 11/27/21 0419 11/28/21 0414  ?Weight: 107.8 kg 106 kg 105.7 kg  ? ?GEN: NAD ?SKIN: No rash ?EYES: EOMI ?ENT: MMM ?CV: RRR ?PULM: CTA B ?ABD: soft, obese, NT, +BS ?CNS: AAO x 3, non focal ?EXT: No edema or tenderness ? ? ?Condition at discharge: stable ? ?The results of significant diagnostics from this hospitalization (including imaging, microbiology, ancillary and laboratory) are listed below for reference.  ? ?Imaging Studies: ?CT Angio Chest Pulmonary Embolism (PE) W or WO Contrast ? ?Result Date: 11/24/2021 ?CLINICAL DATA:  Short of breath. Abdominal pain. Bilateral leg pain. Symptoms for 3-4 days. EXAM: CT ANGIOGRAPHY CHEST WITH CONTRAST TECHNIQUE: Multidetector CT imaging of the chest was performed using the standard protocol during bolus administration of intravenous contrast. Multiplanar  CT image reconstructions and MIPs were obtained to evaluate the vascular anatomy. RADIATION DOSE REDUCTION: This exam was performed according to the departmental dose-optimization program which includes automated exposure control, adjustment of the mA and/or kV according to patient size and/or use of iterative reconstruction technique. CONTRAST:  29m OMNIPAQUE IOHEXOL 350 MG/ML SOLN COMPARISON:  08/09/2021. FINDINGS: Cardiovascular: Pulmonary arteries are well opacified. There is no evidence of a pulmonary embolism. Heart top-normal in size. Trace pericardial effusion. Three-vessel coronary artery calcifications. Great vessels are normal in caliber. Aorta is not opacified. Mediastinum/Nodes: Multiple subcentimeter shotty mediastinal lymph nodes. Enlarged right infrahilar node, 1.7 cm in short axis, similar to the prior CT. No mediastinal or hilar masses. Trachea and esophagus are unremarkable. Lungs/Pleura: Small, right greater than left, pleural effusions. Interstitial thickening and hazy areas of ground-glass opacity are similar to the prior chest CTA, consistent with congestive heart failure and pulmonary edema. No  convincing pneumonia. Several small calcified granuloma in the lower lobes, also stable. No pneumothorax. Upper Abdomen: No acute findings. Musculoskeletal: No fracture or acute finding.  No bone lesion.

## 2021-11-28 NOTE — Progress Notes (Signed)
? ?Progress Note ? ?Patient Name: Gabriel [name]. ?Date of Encounter: 11/28/2021 ? ?[facility] Cardiologist: [name], MD  ? ?Subjective  ? ?Doing okay, denies chest pain, shortness of breath, leg edema. Feels much better compared to admission.  Like to go home today. ? ?Inpatient Medications  ?  ?Scheduled Meds: ? carvedilol  6.25 mg Oral BID WC  ? dapagliflozin propanediol  10 mg Oral Daily  ? ezetimibe  10 mg Oral Daily  ? fenofibrate  160 mg Oral Daily  ? insulin aspart  0-5 Units Subcutaneous QHS  ? insulin aspart  0-9 Units Subcutaneous TID WC  ? insulin glargine-yfgn  20 Units Subcutaneous Q2200  ? pantoprazole  40 mg Oral BID AC  ? potassium chloride  20 mEq Oral BID  ? rivaroxaban  20 mg Oral Q supper  ? rosuvastatin  20 mg Oral QHS  ? [START ON 11/29/2021] sacubitril-valsartan  1 tablet Oral BID  ? senna-docusate  2 tablet Oral BID  ? spironolactone  25 mg Oral Daily  ? sucralfate  1 g Oral TID WC & HS  ? [START ON 11/29/2021] torsemide  40 mg Oral BID  ? ?Continuous Infusions: ? ?PRN Meds: ?albuterol, dextromethorphan-guaiFENesin, hydrALAZINE, Muscle Rub, nitroGLYCERIN, ondansetron (ZOFRAN) IV, oxyCODONE-acetaminophen  ? ?Vital Signs  ?  ?Vitals:  ? 11/28/21 0412 11/28/21 0414 11/28/21 0750 11/28/21 1143  ?BP: 99/63  106/71 112/73  ?Pulse: 67  66 68  ?Resp: '20  17 19  '$ ?Temp: (!) 97.3 ?F (36.3 ?C)  97.7 ?F (36.5 ?C) 97.9 ?F (36.6 ?C)  ?TempSrc: Oral     ?SpO2: 96%  96% 99%  ?Weight:  105.7 kg    ?Height:      ? ? ?Intake/Output Summary (Last 24 hours) at 11/28/2021 1523 ?Last data filed at 11/28/2021 0900 ?Gross per 24 hour  ?Intake 960 ml  ?Output 1150 ml  ?Net -190 ml  ? ? ?  11/28/2021  ?  4:14 AM 11/27/2021  ?  4:19 AM 11/26/2021  ?  4:59 AM  ?Last 3 Weights  ?Weight (lbs) 233 lb 233 lb 11 oz 237 lb 11.2 oz  ?Weight (kg) 105.688 kg 106 kg 107.82 kg  ?   ? ?Telemetry  ?  ?Sinus rhythm- Personally Reviewed ? ?ECG  ?  ? - Personally Reviewed ? ?Physical Exam  ? ?GEN: No acute distress.   ?Neck: No  JVD ?Cardiac: RRR, no murmurs, rubs, or gallops.  ?Respiratory: Clear to auscultation bilaterally. ?GI: Soft, nontender, distended  ?MS: No edema; No deformity. ?Neuro:  Nonfocal  ?Psych: Normal affect  ? ?Labs  ?  ?High Sensitivity Troponin:   ?Recent Labs  ?Lab 11/24/21 ?2703 11/24/21 ?5009 11/24/21 ?0930  ?TROPONINIHS 50* 55* 59*  ?   ?Chemistry ?Recent Labs  ?Lab 11/24/21 ?3818 11/24/21 ?2993 11/25/21 ?7169 11/26/21 ?6789 11/27/21 ?3810 11/28/21 ?1751  ?NA 137  --  139 140 135 137  ?K 4.5  --  3.8 3.7 4.7 4.5  ?CL 106  --  104 102 102 98  ?CO2 20*  --  '26 26 23 27  '$ ?GLUCOSE 228*  --  137* 124* 157* 151*  ?BUN 29*  --  29* 28* 28* 40*  ?CREATININE 1.55*  --  1.47* 1.34* 1.36* 1.81*  ?CALCIUM 9.0  --  8.5* 8.9 9.2 9.6  ?MG  --    < > 1.8 1.9 2.0 2.2  ?PROT 7.1  --  6.3*  --   --  7.5  ?ALBUMIN  3.8  --  3.1*  --   --  3.6  ?AST 265*  --  140*  --   --  151*  ?ALT 262*  --  251*  --   --  332*  ?ALKPHOS 63  --  45  --   --  51  ?BILITOT 3.5*  --  2.0*  --   --  1.6*  ?GFRNONAA 54*  --  57* >60 >60 44*  ?ANIONGAP 11  --  '9 12 10 12  '$ ? < > = values in this interval not displayed.  ?  ?Lipids  ?Recent Labs  ?Lab 11/25/21 ?0620  ?CHOL 110  ?TRIG 153*  ?HDL 17*  ?[place] 62  ?CHOLHDL 6.5  ?  ?Hematology ?Recent Labs  ?Lab 11/26/21 ?0616 11/27/21 ?0629 11/28/21 ?7017  ?WBC 9.2 8.7 8.5  ?RBC 6.16* 6.26* 6.53*  ?HGB 16.8 17.3* 18.1*  ?HCT 51.9 52.8* 55.7*  ?MCV 84.3 84.3 85.3  ?MCH 27.3 27.6 27.7  ?MCHC 32.4 32.8 32.5  ?RDW 14.7 15.0 15.2  ?PLT 198 230 235  ? ?Thyroid No results for input(s): TSH, FREET4 in the last 168 hours.  ?BNP ?Recent Labs  ?Lab 11/24/21 ?7939  ?BNP 1,654.7*  ?  ?DDimer No results for input(s): DDIMER in the last 168 hours.  ? ?Radiology  ?  ?No results found. ? ?Cardiac Studies  ? ?2D echo 11/24/2021: ?1. Left ventricular ejection fraction, by estimation, is 20 to 25%. The  ?left ventricle has severely decreased function. The left ventricle  ?demonstrates global hypokinesis. The left ventricular  internal cavity size  ?was moderately to severely dilated.  ?There is mild left ventricular hypertrophy. Left ventricular diastolic  ?parameters are consistent with Grade III diastolic dysfunction  ?(restrictive).  ? 2. Right ventricular systolic function is severely reduced. The right  ?ventricular size is moderately enlarged.  ? 3. Left atrial size was mildly dilated.  ? 4. Right atrial size was mildly dilated.  ? 5. The mitral valve is grossly normal. Mild mitral valve regurgitation.  ? 6. The aortic valve is normal in structure. Aortic valve regurgitation is  ?moderate. No aortic stenosis is present. ? ?Patient Profile  ?   ?51 y.o. male with history of CAD/CTO to RCA, HFrEF, PE, OSA presenting with shortness of breath being seen for CHF exacerbation. ? ?Assessment & Plan  ?  ?HFrEF EF 25% ?-Appears euvolemic ?-Continue Coreg, Entresto, Aldactone ?-Start torsemide 40 mg twice daily(was previously on 60 mg daily) ?-Check BMP tomorrow ?-Follow-up as outpatient within 1 week ? ?2. CAD, CTO RCA ?-Denies chest pain ?-Continue aspirin, ? ?3.  History of PE ?-PTA Xarelto  ? ?Total encounter time more than 50 minutes ? Greater than 50% was spent in counseling and coordination of care with the patient ? ?   ?Signed, ?Kate Sable, MD  ?11/28/2021, 3:23 PM   ? ?

## 2021-11-29 ENCOUNTER — Other Ambulatory Visit
Admission: RE | Admit: 2021-11-29 | Discharge: 2021-11-29 | Disposition: A | Discharge status: Home / Self Care | Payer: BC Managed Care – PPO | Source: Ambulatory Visit | Attending: Nurse Practitioner | Admitting: Nurse Practitioner

## 2021-11-29 ENCOUNTER — Other Ambulatory Visit: Payer: Self-pay | Admitting: *Deleted

## 2021-11-29 ENCOUNTER — Other Ambulatory Visit: Payer: BC Managed Care – PPO

## 2021-11-29 DIAGNOSIS — I428 Other cardiomyopathies: Secondary | ICD-10-CM

## 2021-11-29 DIAGNOSIS — I5023 Acute on chronic systolic (congestive) heart failure: Secondary | ICD-10-CM

## 2021-11-29 DIAGNOSIS — N183 Chronic kidney disease, stage 3 unspecified: Secondary | ICD-10-CM | POA: Diagnosis not present

## 2021-11-29 DIAGNOSIS — I5022 Chronic systolic (congestive) heart failure: Secondary | ICD-10-CM

## 2021-11-29 DIAGNOSIS — I2510 Atherosclerotic heart disease of native coronary artery without angina pectoris: Secondary | ICD-10-CM

## 2021-11-29 LAB — BASIC METABOLIC PANEL
Anion gap: 10 (ref 5–15)
BUN: 51 mg/dL — ABNORMAL HIGH (ref 6–20)
CO2: 27 mmol/L (ref 22–32)
Calcium: 9.6 mg/dL (ref 8.9–10.3)
Chloride: 99 mmol/L (ref 98–111)
Creatinine, Ser: 2.08 mg/dL — ABNORMAL HIGH (ref 0.61–1.24)
GFR, Estimated: 38 mL/min — ABNORMAL LOW (ref >60)
Glucose, Bld: 172 mg/dL — ABNORMAL HIGH (ref 70–99)
Potassium: 4.6 mmol/L (ref 3.5–5.1)
Sodium: 136 mmol/L (ref 135–145)

## 2021-11-29 IMAGING — CR DG CHEST 2V
1 series · 2 of 2 positions shown · non-contrast
Comparison: Chest radiograph and chest CT May 12, 2020

CLINICAL DATA: Chest pain and shortness of breath

EXAM:
CHEST - 2 VIEW

[Series 1: dg chest 2 view · 0.14mm/px · 2 of 2 slices shown]
[im 1/2]
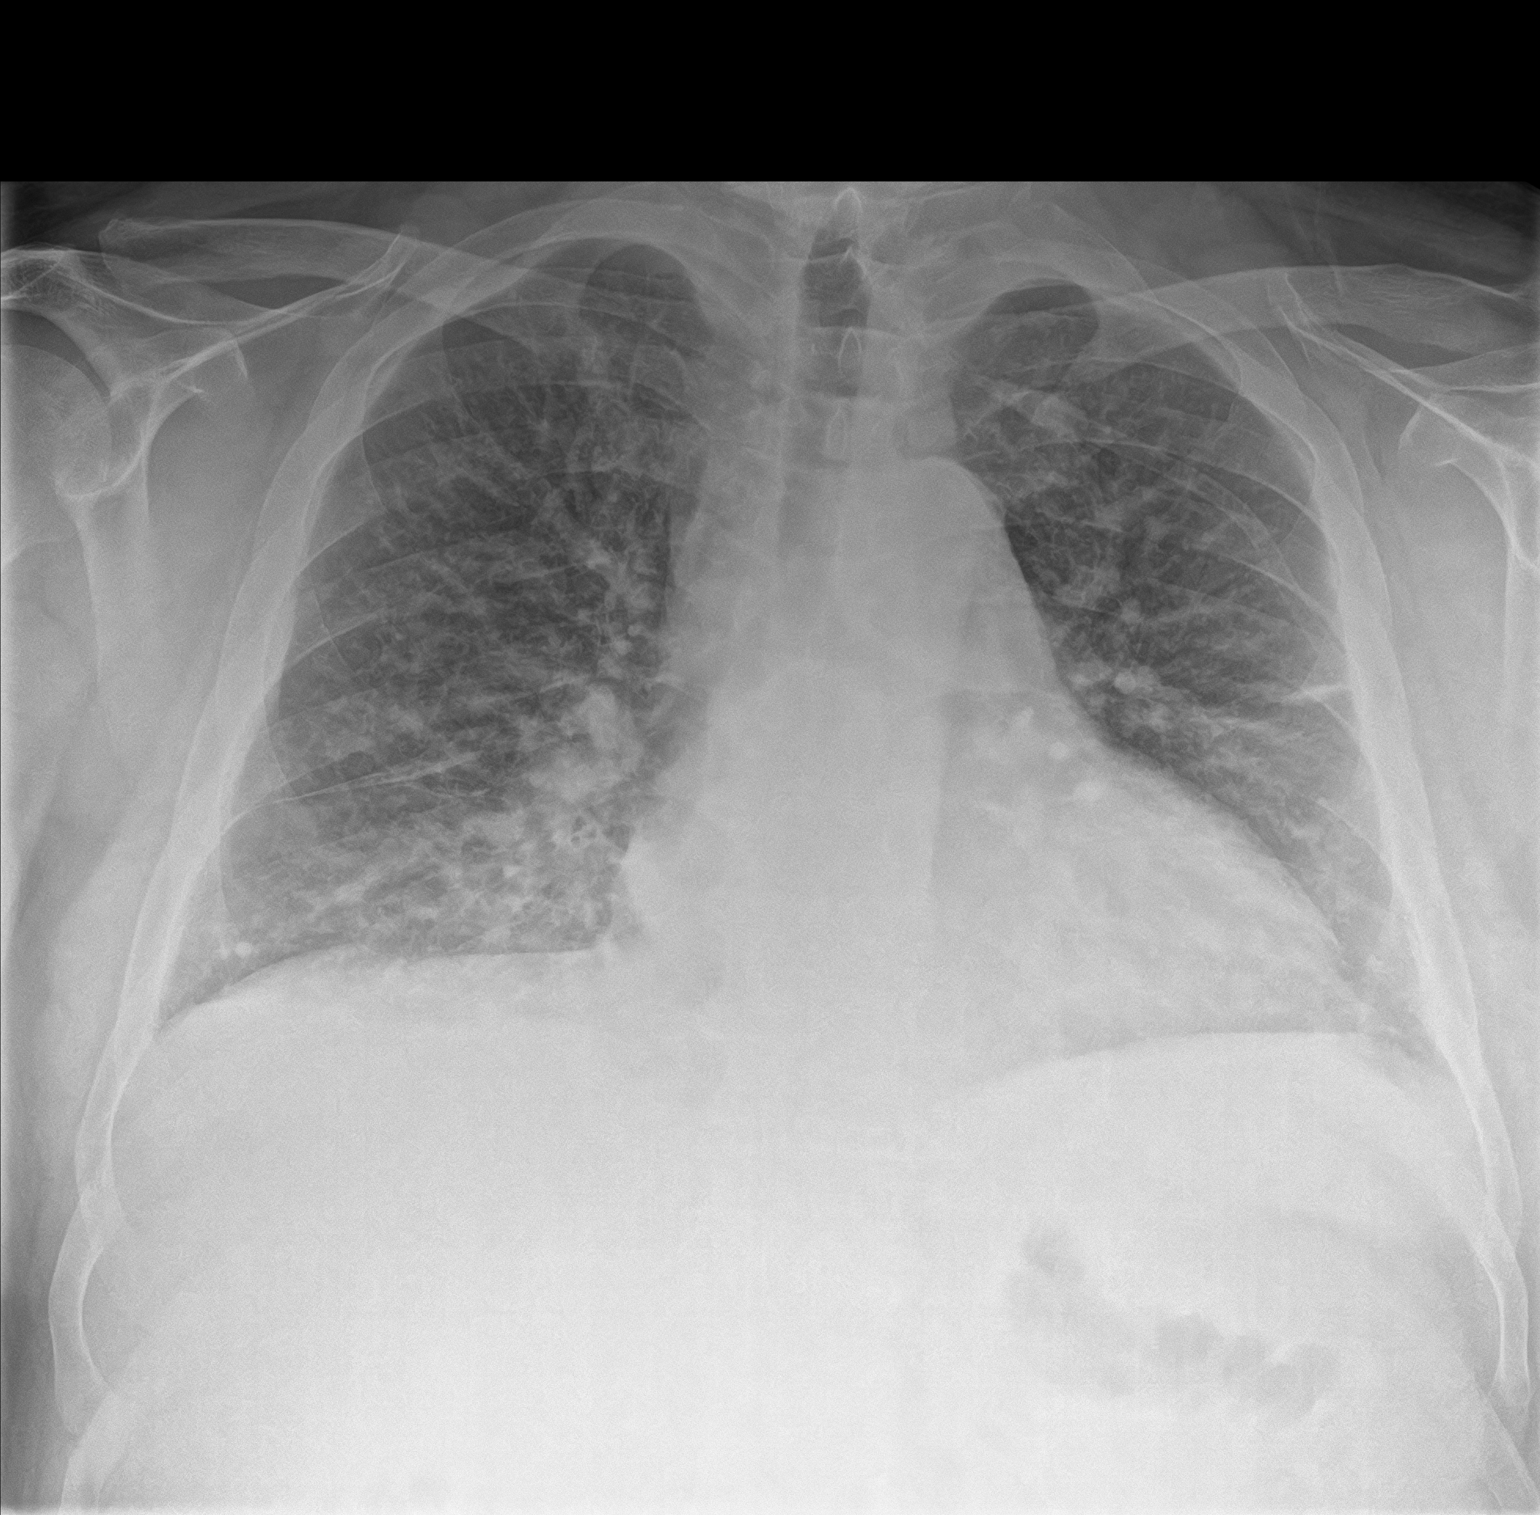
[im 2/2]
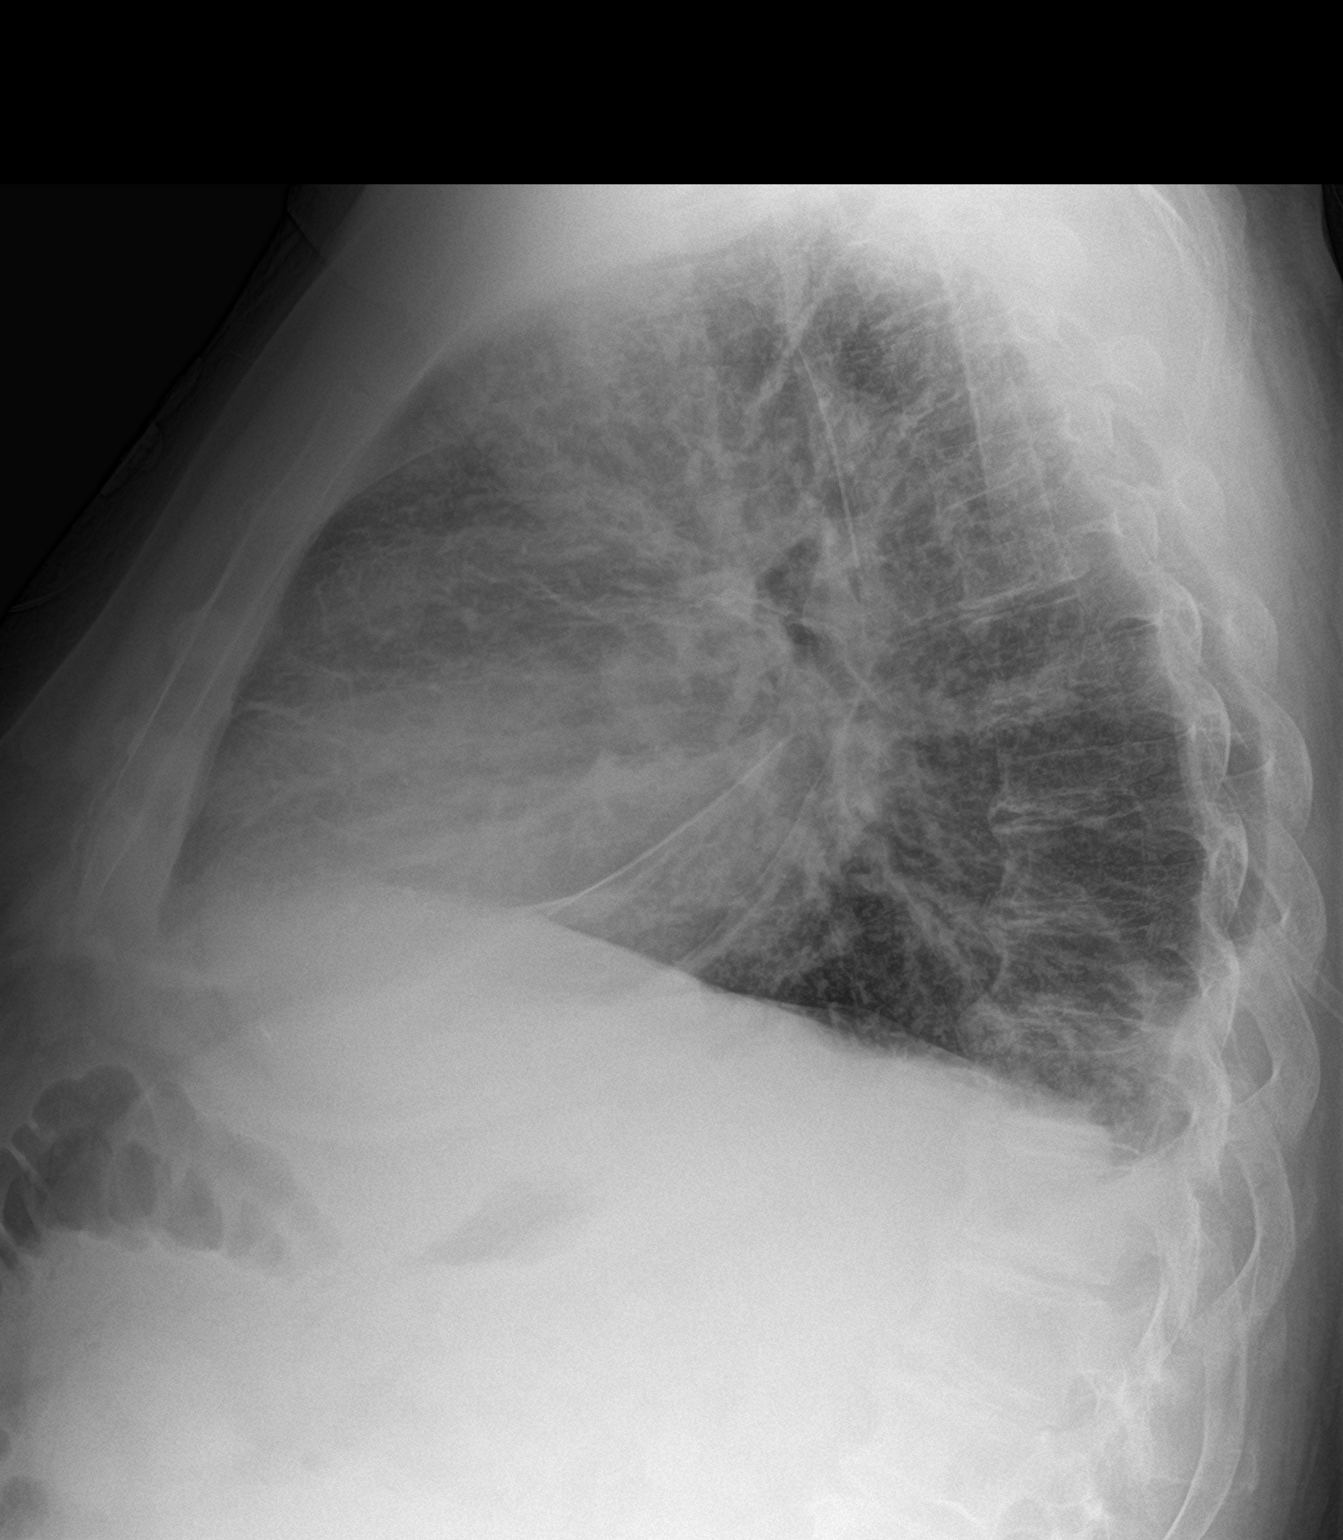

[2 of 2 positions shown; findings below may reference images not displayed]

FINDINGS: There is cardiomegaly with pulmonary vascular congestion. There are
small joint effusions bilaterally with a degree of interstitial
pulmonary edema. There is mild left midlung atelectasis. No
consolidation. No adenopathy. No bone lesions. No pneumothorax.
IMPRESSION: Cardiomegaly with pulmonary vascular congestion. There is a degree
of interstitial pulmonary edema with small pleural effusions. The
overall appearance is felt to be indicative of a degree of
congestive heart failure. No consolidation. Mild atelectasis left
mid lung noted.

## 2021-11-30 LAB — CULTURE, BLOOD (SINGLE)
Culture: NO GROWTH
Special Requests: ADEQUATE

## 2021-12-03 ENCOUNTER — Other Ambulatory Visit
Admission: RE | Admit: 2021-12-03 | Discharge: 2021-12-03 | Disposition: A | Discharge status: Home / Self Care | Payer: BC Managed Care – PPO | Attending: Nurse Practitioner | Admitting: Nurse Practitioner

## 2021-12-03 ENCOUNTER — Other Ambulatory Visit: Payer: Self-pay | Admitting: *Deleted

## 2021-12-03 DIAGNOSIS — I2510 Atherosclerotic heart disease of native coronary artery without angina pectoris: Secondary | ICD-10-CM | POA: Diagnosis not present

## 2021-12-03 DIAGNOSIS — I428 Other cardiomyopathies: Secondary | ICD-10-CM | POA: Diagnosis not present

## 2021-12-03 DIAGNOSIS — I5022 Chronic systolic (congestive) heart failure: Secondary | ICD-10-CM | POA: Diagnosis not present

## 2021-12-03 DIAGNOSIS — I5023 Acute on chronic systolic (congestive) heart failure: Secondary | ICD-10-CM

## 2021-12-03 DIAGNOSIS — N183 Chronic kidney disease, stage 3 unspecified: Secondary | ICD-10-CM | POA: Insufficient documentation

## 2021-12-03 LAB — BASIC METABOLIC PANEL
Anion gap: 7 (ref 5–15)
BUN: 28 mg/dL — ABNORMAL HIGH (ref 6–20)
CO2: 25 mmol/L (ref 22–32)
Calcium: 9.3 mg/dL (ref 8.9–10.3)
Chloride: 107 mmol/L (ref 98–111)
Creatinine, Ser: 1.46 mg/dL — ABNORMAL HIGH (ref 0.61–1.24)
GFR, Estimated: 58 mL/min — ABNORMAL LOW (ref >60)
Glucose, Bld: 157 mg/dL — ABNORMAL HIGH (ref 70–99)
Potassium: 5.2 mmol/L — ABNORMAL HIGH (ref 3.5–5.1)
Sodium: 139 mmol/L (ref 135–145)

## 2021-12-03 IMAGING — CR DG CHEST 2V
1 series · 2 of 2 positions shown · non-contrast
Comparison: Chest x-rays dated 06/07/2020, 05/12/2020 and
12/31/2019.

CLINICAL DATA: Chest pain

EXAM:
CHEST - 2 VIEW

[Series 1: dg chest 2 view · 0.14mm/px · 2 of 2 slices shown]
[im 1/2]
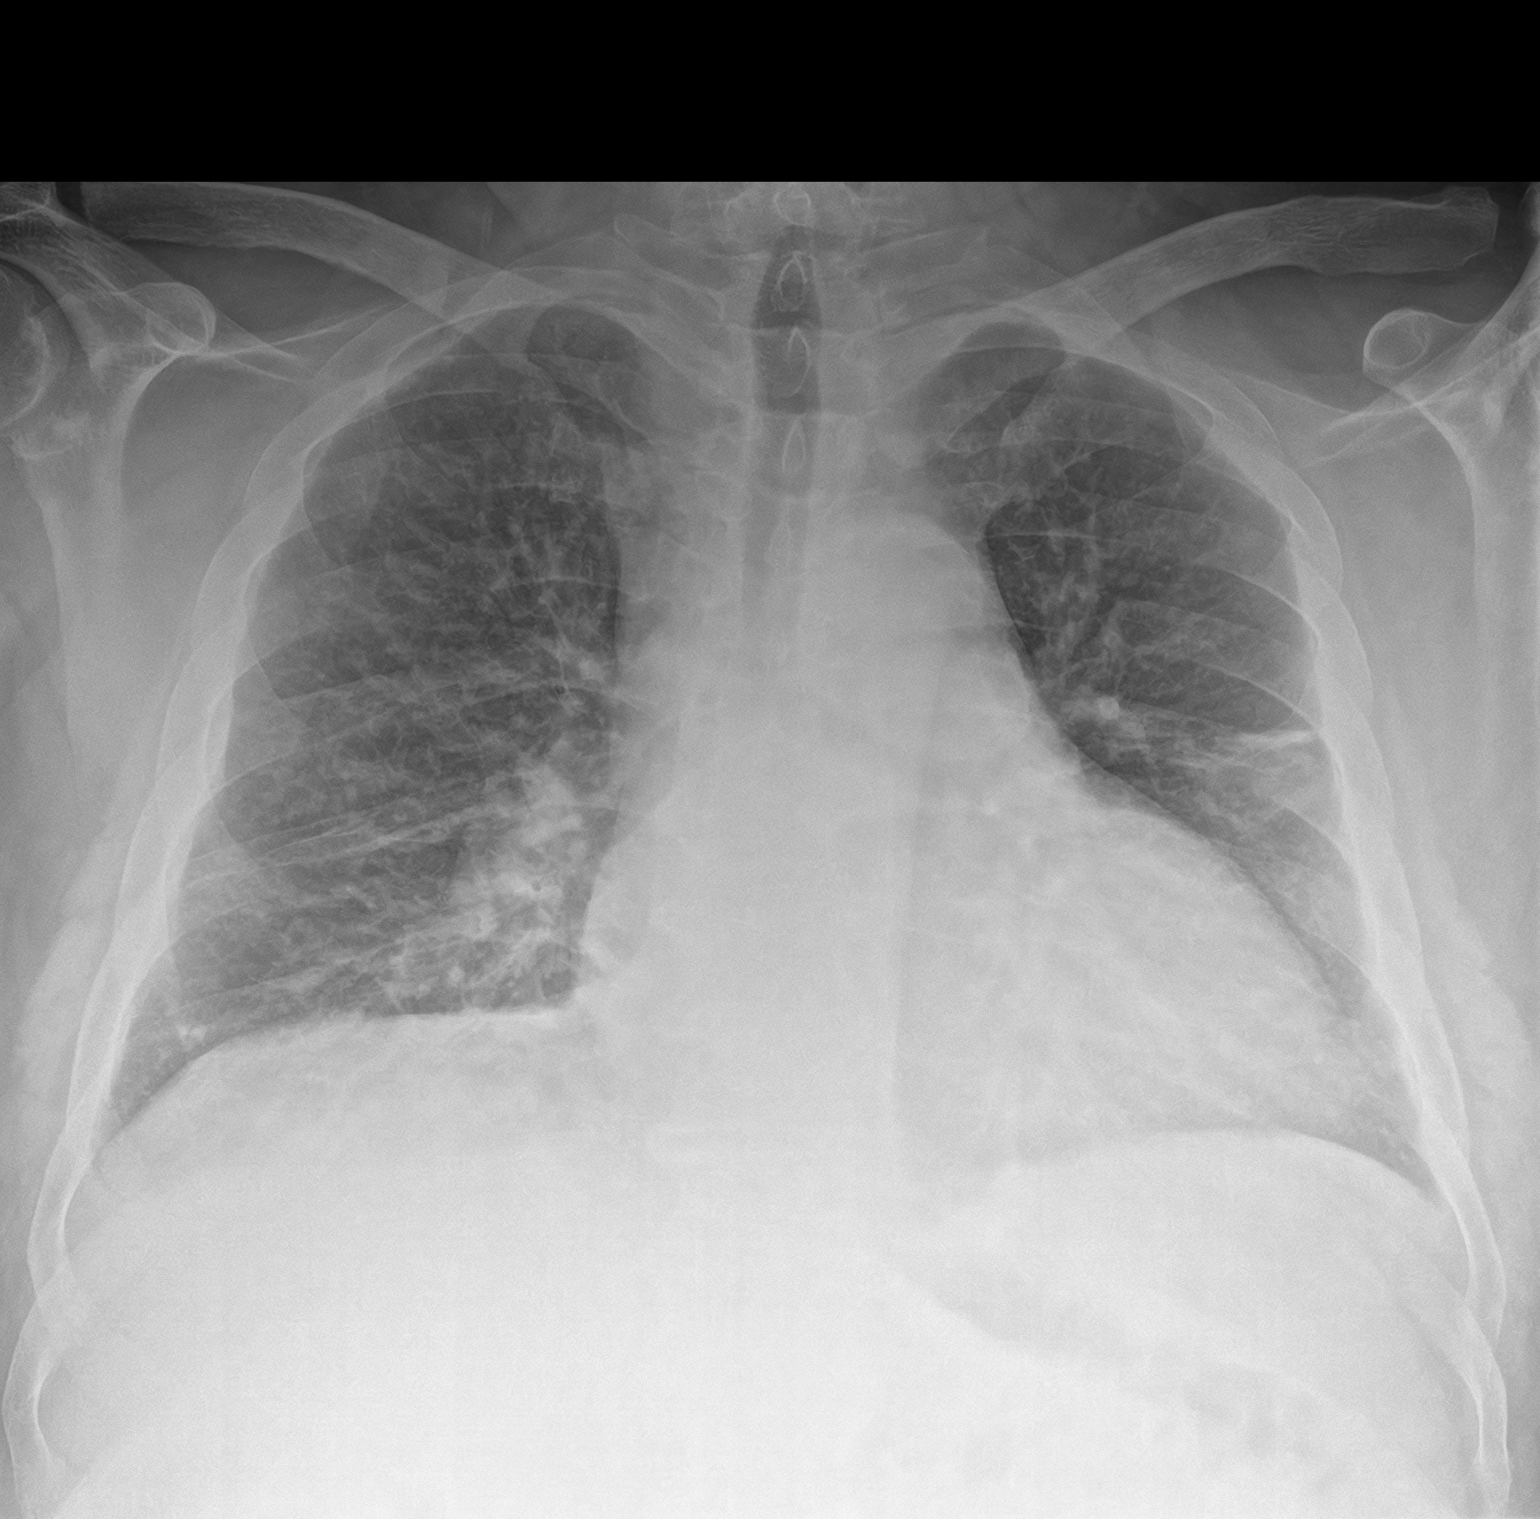
[im 2/2]
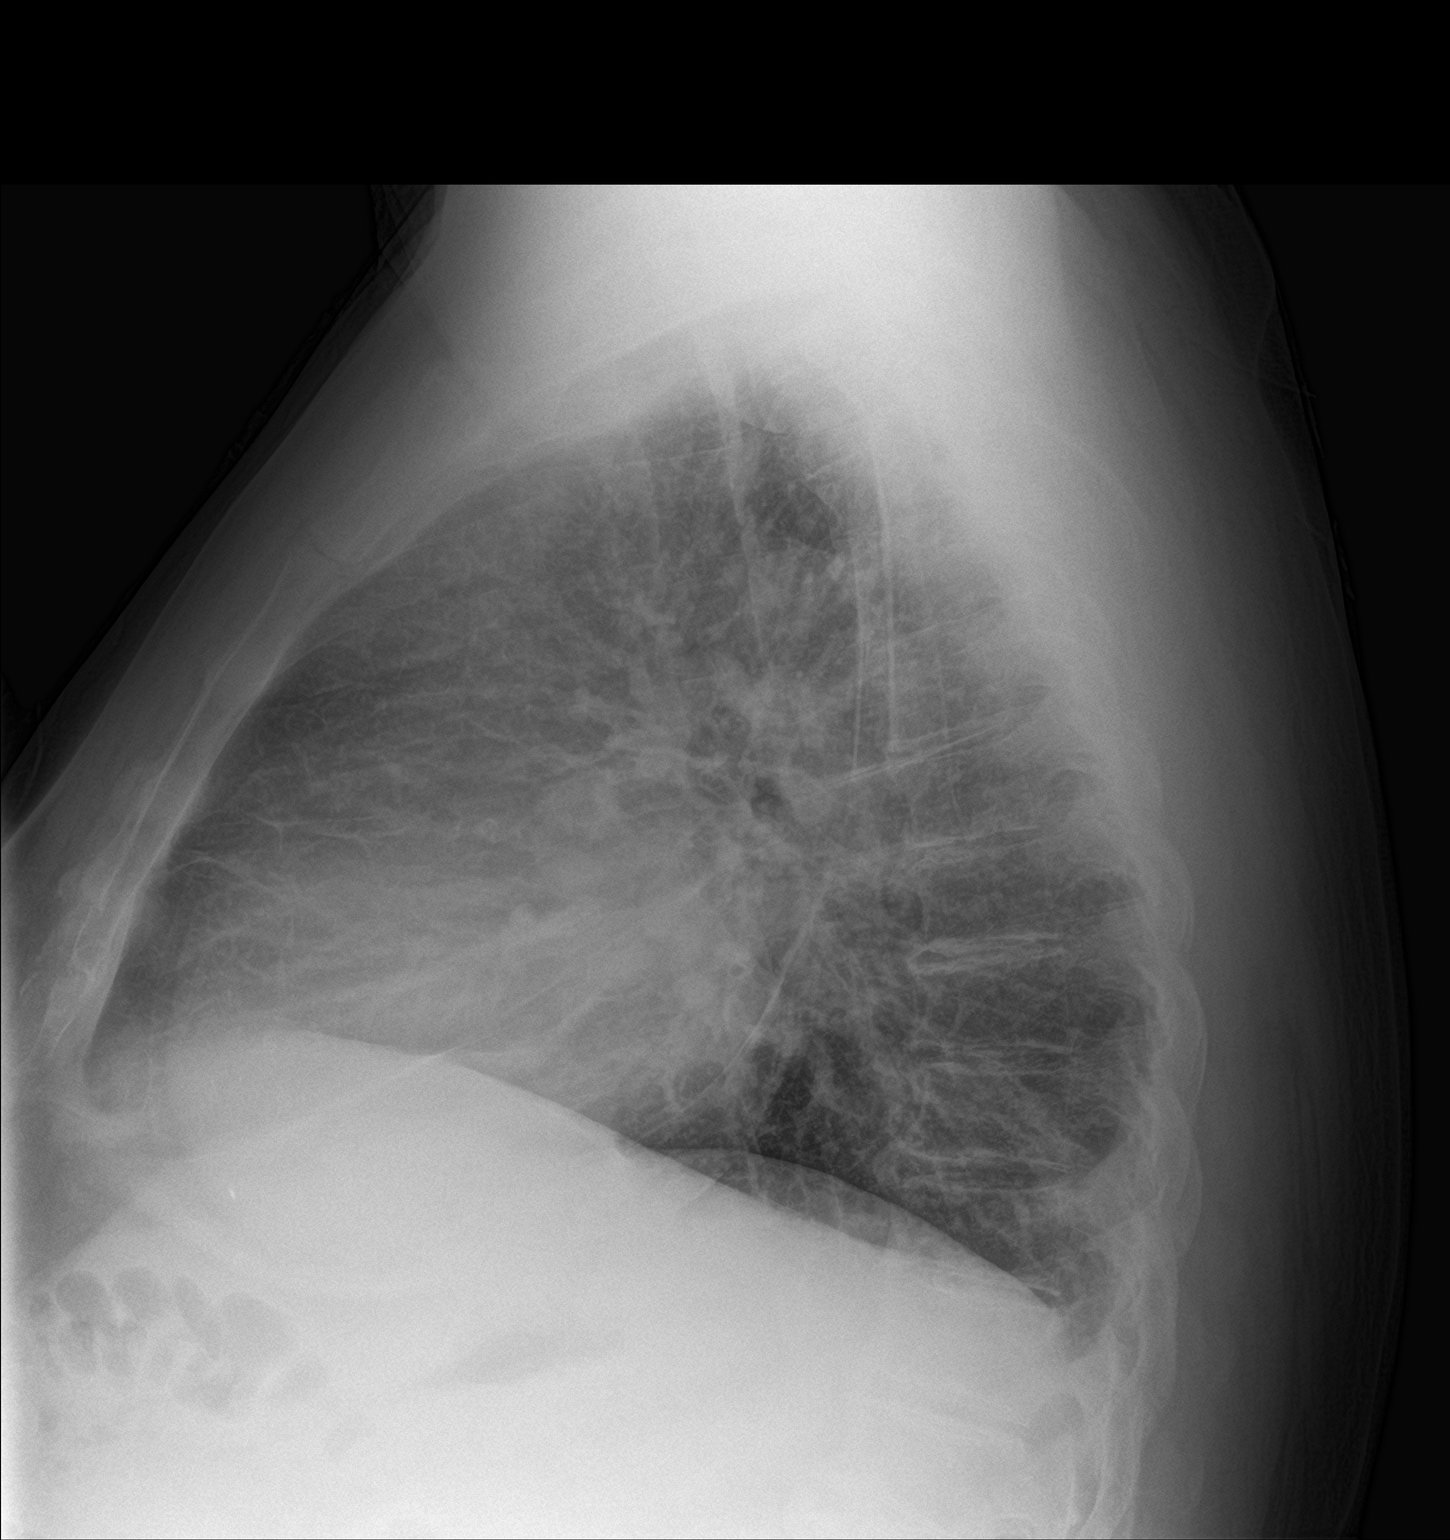

[2 of 2 positions shown; findings below may reference images not displayed]

FINDINGS: Stable cardiomegaly. Chronic scarring/atelectasis within the LEFT
mid lung region. Stable mild interstitial prominence bilaterally.
Lungs are otherwise clear. No pleural effusion or pneumothorax is
seen. No acute appearing osseous abnormality. Mild degenerative
spurring in the mid/lower thoracic spine.
IMPRESSION: 1. Stable interstitial prominence bilaterally, suggesting chronic
mild CHF/volume overload.
2. No evidence of superimposed pneumonia.
3. Stable cardiomegaly.

## 2021-12-03 MED ORDER — TORSEMIDE 40 MG PO TABS: 60.0000 mg | ORAL_TABLET | Freq: Every day | ORAL | 0 refills | Status: DC

## 2021-12-04 ENCOUNTER — Telehealth: Payer: Self-pay | Admitting: Family

## 2021-12-04 ENCOUNTER — Other Ambulatory Visit: Payer: Self-pay

## 2021-12-04 ENCOUNTER — Telehealth: Payer: Self-pay | Admitting: Cardiovascular Disease

## 2021-12-04 ENCOUNTER — Emergency Department: Payer: BC Managed Care – PPO

## 2021-12-04 ENCOUNTER — Emergency Department
Admission: EM | Admit: 2021-12-04 | Discharge: 2021-12-04 | Disposition: A | Discharge status: Home / Self Care | Payer: BC Managed Care – PPO | Attending: Emergency Medicine | Admitting: Emergency Medicine

## 2021-12-04 DIAGNOSIS — R079 Chest pain, unspecified: Secondary | ICD-10-CM | POA: Diagnosis not present

## 2021-12-04 DIAGNOSIS — R9431 Abnormal electrocardiogram [ECG] [EKG]: Secondary | ICD-10-CM | POA: Diagnosis not present

## 2021-12-04 DIAGNOSIS — I2510 Atherosclerotic heart disease of native coronary artery without angina pectoris: Secondary | ICD-10-CM | POA: Diagnosis not present

## 2021-12-04 DIAGNOSIS — R0602 Shortness of breath: Secondary | ICD-10-CM | POA: Diagnosis not present

## 2021-12-04 DIAGNOSIS — Z7901 Long term (current) use of anticoagulants: Secondary | ICD-10-CM | POA: Insufficient documentation

## 2021-12-04 DIAGNOSIS — I509 Heart failure, unspecified: Secondary | ICD-10-CM | POA: Diagnosis not present

## 2021-12-04 DIAGNOSIS — N189 Chronic kidney disease, unspecified: Secondary | ICD-10-CM | POA: Diagnosis not present

## 2021-12-04 LAB — CBC
HCT: 56.2 % — ABNORMAL HIGH (ref 39.0–52.0)
Hemoglobin: 18.2 g/dL — ABNORMAL HIGH (ref 13.0–17.0)
MCH: 27.5 pg (ref 26.0–34.0)
MCHC: 32.4 g/dL (ref 30.0–36.0)
MCV: 84.8 fL (ref 80.0–100.0)
Platelets: 214 10*3/uL (ref 150–400)
RBC: 6.63 MIL/uL — ABNORMAL HIGH (ref 4.22–5.81)
RDW: 14.3 % (ref 11.5–15.5)
WBC: 8 10*3/uL (ref 4.0–10.5)
nRBC: 0 % (ref 0.0–0.2)

## 2021-12-04 LAB — TROPONIN I (HIGH SENSITIVITY)
Troponin I (High Sensitivity): 40 ng/L — ABNORMAL HIGH (ref <18)
Troponin I (High Sensitivity): 39 ng/L — ABNORMAL HIGH (ref <18)

## 2021-12-04 LAB — BASIC METABOLIC PANEL
Anion gap: 11 (ref 5–15)
BUN: 34 mg/dL — ABNORMAL HIGH (ref 6–20)
CO2: 27 mmol/L (ref 22–32)
Calcium: 9.2 mg/dL (ref 8.9–10.3)
Chloride: 99 mmol/L (ref 98–111)
Creatinine, Ser: 1.43 mg/dL — ABNORMAL HIGH (ref 0.61–1.24)
GFR, Estimated: 59 mL/min — ABNORMAL LOW (ref >60)
Glucose, Bld: 380 mg/dL — ABNORMAL HIGH (ref 70–99)
Potassium: 3.8 mmol/L (ref 3.5–5.1)
Sodium: 137 mmol/L (ref 135–145)

## 2021-12-04 LAB — HEPATIC FUNCTION PANEL
ALT: 61 U/L — ABNORMAL HIGH (ref 0–44)
AST: 31 U/L (ref 15–41)
Albumin: 4.1 g/dL (ref 3.5–5.0)
Alkaline Phosphatase: 58 U/L (ref 38–126)
Bilirubin, Direct: 0.2 mg/dL (ref 0.0–0.2)
Indirect Bilirubin: 1.1 mg/dL — ABNORMAL HIGH (ref 0.3–0.9)
Total Bilirubin: 1.3 mg/dL — ABNORMAL HIGH (ref 0.3–1.2)
Total Protein: 7.9 g/dL (ref 6.5–8.1)

## 2021-12-04 LAB — BRAIN NATRIURETIC PEPTIDE: B Natriuretic Peptide: 243 pg/mL — ABNORMAL HIGH (ref 0.0–100.0)

## 2021-12-04 MED ORDER — FUROSEMIDE 10 MG/ML IJ SOLN
60.0000 mg | Freq: Once | INTRAMUSCULAR | Status: AC
  Administered 2021-12-04: 60 mg via INTRAVENOUS
  Filled 2021-12-04: qty 8

## 2021-12-04 NOTE — ED Triage Notes (Signed)
Pt c/o left sided chest pain and SOB since last Wednesday that has worsened since being discharged last week.  ?

## 2021-12-04 NOTE — Telephone Encounter (Signed)
Patient called to say that he hasn't felt right since his recent discharge but he's gotten worse over the last 2 days. He says that he's more SOB and gets dizzy at times. He also says that for the last 2 days, he's experienced nausea and vomiting, most recently this morning when he tried to go to work.  ? ?Does not know if he's gained weight as he hasn't weighed himself today. Was wondering if he needed to go to the ED for evaluation. Explained that with the GI symptoms he is having in addition to his other symptoms, he may be better off going to the ED for evaluation or calling his PCP. Patient says that he will have his dad take him back to the ED.  ?

## 2021-12-04 NOTE — ED Provider Notes (Addendum)
? ?[facility] ?Provider Note ? ? ? Event Date/Time  ? First MD Initiated Contact with Patient 12/04/21 1256   ?  (approximate) ? ? ?History  ? ?Chest Pain and Shortness of Breath ? ? ?HPI ? ?Gabriel Croghan. is a 51 y.o. male who presents to the ED for evaluation of Chest Pain and Shortness of Breath ?  ?I reviewed medical DC summary from 4/12.  History of PE on Xarelto, CAD, mildly reduced ejection fraction and CKD.  He was admitted for CHF exacerbation.  Baseline creatinine around 1.3-1.5. ? ?Patient presents to the ED, accompanied by his father, for evaluation of "more of the same."  He reports feeling similar as his previous CHF exacerbation.  He reports gaining 3 or 5 pounds of weight in just a couple days after his recent discharge.  Reports occasional left-sided chest discomfort, particularly with supine positioning as well as orthopnea and dyspnea on exertion. ? ?Physical Exam  ? ?Triage Vital Signs: ?ED Triage Vitals  ?Enc Vitals Group  ?   BP 12/04/21 0915 115/73  ?   Pulse Rate 12/04/21 0915 74  ?   Resp 12/04/21 0915 20  ?   Temp 12/04/21 0918 (P) 97.7 ?F (36.5 ?C)  ?   Temp Source 12/04/21 0915 Oral  ?   SpO2 12/04/21 0915 94 %  ?   Weight 12/04/21 0916 240 lb (108.9 kg)  ?   Height 12/04/21 0916 6' (1.829 m)  ?   Head Circumference --   ?   Peak Flow --   ?   Pain Score 12/04/21 0916 10  ?   Pain Loc --   ?   Pain Edu? --   ?   Excl. in [place]? --   ? ? ?Most recent vital signs: ?Vitals:  ? 12/04/21 0918 12/04/21 1319  ?BP:  126/71  ?Pulse:  72  ?Resp:  20  ?Temp: (P) 97.7 ?F (36.5 ?C)   ?SpO2:  96%  ? ? ?General: Awake, no distress.  Appears comfortable, pleasant and conversational. ?CV:  Good peripheral perfusion. RRR ?Resp:  Normal effort.  Faint bibasilar crackles.  No wheezing. ?Abd:  No distention.  Soft and benign ?MSK:  No deformity noted.  ?Neuro:  No focal deficits appreciated. ?Other:   ? ? ?ED Results / Procedures / Treatments  ? ?Labs ?(all labs ordered are listed,  but only abnormal results are displayed) ?Labs Reviewed  ?BASIC METABOLIC PANEL - Abnormal; Notable for the following components:  ?    Result Value  ? Glucose, Bld 380 (*)   ? BUN 34 (*)   ? Creatinine, Ser 1.43 (*)   ? GFR, Estimated 59 (*)   ? All other components within normal limits  ?CBC - Abnormal; Notable for the following components:  ? RBC 6.63 (*)   ? Hemoglobin 18.2 (*)   ? HCT 56.2 (*)   ? All other components within normal limits  ?BRAIN NATRIURETIC PEPTIDE - Abnormal; Notable for the following components:  ? B Natriuretic Peptide 243.0 (*)   ? All other components within normal limits  ?HEPATIC FUNCTION PANEL - Abnormal; Notable for the following components:  ? ALT 61 (*)   ? Total Bilirubin 1.3 (*)   ? Indirect Bilirubin 1.1 (*)   ? All other components within normal limits  ?TROPONIN I (HIGH SENSITIVITY) - Abnormal; Notable for the following components:  ? Troponin I (High Sensitivity) 40 (*)   ? All other components  within normal limits  ?TROPONIN I (HIGH SENSITIVITY) - Abnormal; Notable for the following components:  ? Troponin I (High Sensitivity) 39 (*)   ? All other components within normal limits  ? ? ?EKG ?Sinus rhythm, rate of 77 bpm.  Rightward axis.  QTc 470.  aVR is somewhat elevated and diffuse ST depressions.  Somewhat worse than comparison on 4/8, though similar overall. ? ?RADIOLOGY ?2 view CXR reviewed by me with increased pulmonary vascular congestion ? ?Official radiology report(s): ?DG Chest 2 View ? ?Result Date: 12/04/2021 ?CLINICAL DATA:  Chest pain EXAM: CHEST - 2 VIEW COMPARISON:  11/24/2021 FINDINGS: Heart size within normal limits. Slightly low lung volumes. Increased interstitial markings bilaterally. Improved aeration of the lung bases compared to prior. No pleural effusion or pneumothorax. IMPRESSION: Increased interstitial markings bilaterally which may reflect interstitial edema or atypical/viral infection. Improved aeration of the lung bases compared to prior.  Electronically Signed   By: Davina Poke D.O.   On: 12/04/2021 09:38   ? ?PROCEDURES and INTERVENTIONS: ? ?.1-3 Lead EKG Interpretation ?Performed by: Vladimir Crofts, MD ?Authorized by: Vladimir Crofts, MD  ? ?  Interpretation: normal   ?  ECG rate:  70 ?  ECG rate assessment: normal   ?  Rhythm: sinus rhythm   ?  Ectopy: none   ?  Conduction: normal   ? ?Medications  ?furosemide (LASIX) injection 60 mg (60 mg Intravenous Given 12/04/21 1325)  ? ? ? ?IMPRESSION / MDM / ASSESSMENT AND PLAN / ED COURSE  ?I reviewed the triage vital signs and the nursing notes. ? ?51 year old male with known history of CHF and CAD presents to the ED with chest pain and shortness of breath, likely a CHF exacerbation.  He reports weight gain with classic CHF symptoms and orthopnea.  Look systemically well without distress or indications for BiPAP.  First troponin is somewhat elevated, though lesser than his recent admission and largely flat.  EKG with diffuse ST depressions, similar to previous, though just slightly worsened magnitude.  Does not meet STEMI criteria.  His CXR is congested and he reports increased weight gain.  Overall I am most concerned about CHF.  He is got CKD around baseline and no leukocytosis. ?He is putting out good urine and second troponin is pending.  If this second troponin is not remarkably elevated and he continues to feel better, he may be suitable for outpatient management with close cardiology and CHF clinic follow-up.  Signed out to oncoming provider. ? ?Ultimately, he is discharged as below.  I considered observation admission for this patient, but he declines indicating he prefer to be managed as an outpatient considering his upcoming appointment with cardiology on Thursday.  I think this is reasonable. ? ?Clinical Course as of 12/04/21 1507  ?Tue Dec 04, 2021  ?1436 Reassessed.  Reports feeling a little bit better.  He has put out 600 cc of urine so far.  Second troponin in process. [DS]  ?1507 I  reassessed the patient just prior to signout as I am still here.  He reports feeling better.  We discussed flat troponins and largely benign work-up with only chronic features.  He reports that he does have a follow-up appointment scheduled with his cardiologist later this week, just on Thursday 2 days from now.  Shared decision making we will plan to pursue outpatient management.  We discussed close return precautions for the ED. [DS]  ?  ?Clinical Course User Index ?[DS] Vladimir Crofts, MD  ? ? ? ?  FINAL CLINICAL IMPRESSION(S) / ED DIAGNOSES  ? ?Final diagnoses:  ?Acute on chronic congestive heart failure, unspecified heart failure type ([place])  ? ? ? ?Rx / DC Orders  ? ?ED Discharge Orders   ? ?      Ordered  ?  AMB referral to CHF clinic       ? 12/04/21 1455  ? ?  ?  ? ?  ? ? ? ?Note:  This document was prepared using Dragon voice recognition software and may include unintentional dictation errors. ?  ?Vladimir Crofts, MD ?12/04/21 1455 ? ?  ?Vladimir Crofts, MD ?12/04/21 1507 ? ?

## 2021-12-04 NOTE — Telephone Encounter (Signed)
Spoke with patient and he advised that Gabriel [name] in heart failure clinic instructed him to proceed to ED. He was appreciative for the call back. See telephone encounter from her office.  ?

## 2021-12-04 NOTE — Telephone Encounter (Signed)
?  Pt is requesting to speak with Olin Hauser, he said he feels week and nauseated. He wanted to know if he needs to go to the ED or to our office  ?

## 2021-12-06 ENCOUNTER — Ambulatory Visit (INDEPENDENT_AMBULATORY_CARE_PROVIDER_SITE_OTHER): Payer: BC Managed Care – PPO | Admitting: Nurse Practitioner

## 2021-12-06 ENCOUNTER — Encounter: Payer: Self-pay | Admitting: Nurse Practitioner

## 2021-12-06 VITALS — BP 122/72 | HR 80 | Ht 72.0 in | Wt 235.5 lb

## 2021-12-06 DIAGNOSIS — I428 Other cardiomyopathies: Secondary | ICD-10-CM | POA: Diagnosis not present

## 2021-12-06 DIAGNOSIS — I10 Essential (primary) hypertension: Secondary | ICD-10-CM | POA: Diagnosis not present

## 2021-12-06 DIAGNOSIS — I5022 Chronic systolic (congestive) heart failure: Secondary | ICD-10-CM

## 2021-12-06 DIAGNOSIS — E785 Hyperlipidemia, unspecified: Secondary | ICD-10-CM

## 2021-12-06 DIAGNOSIS — I2510 Atherosclerotic heart disease of native coronary artery without angina pectoris: Secondary | ICD-10-CM | POA: Diagnosis not present

## 2021-12-06 DIAGNOSIS — N183 Chronic kidney disease, stage 3 unspecified: Secondary | ICD-10-CM

## 2021-12-06 DIAGNOSIS — Z86711 Personal history of pulmonary embolism: Secondary | ICD-10-CM

## 2021-12-06 NOTE — Patient Instructions (Addendum)
Medication Instructions:  ?No changes at this time.  ? ?*If you need a refill on your cardiac medications before your next appointment, please call your pharmacy* ? ? ?Lab Work: ?None ? ?If you have labs (blood work) drawn today and your tests are completely normal, you will receive your results only by: ?MyChart Message (if you have MyChart) OR ?A paper copy in the mail ?If you have any lab test that is abnormal or we need to change your treatment, we will call you to review the results. ? ? ?Testing/Procedures: ?None ? ? ?Follow-Up: ?At [facility], you and your health needs are our priority.  As part of our continuing mission to provide you with exceptional heart care, we have created designated Provider Care Teams.  These Care Teams include your primary Cardiologist (physician) and Advanced Practice Providers (APPs -  Physician Assistants and Nurse Practitioners) who all work together to provide you with the care you need, when you need it. ? ?We recommend signing up for the patient portal called "MyChart".  Sign up information is provided on this After Visit Summary.  MyChart is used to connect with patients for Virtual Visits (Telemedicine).  Patients are able to view lab/test results, encounter notes, upcoming appointments, etc.  Non-urgent messages can be sent to your provider as well.   ?To learn more about what you can do with MyChart, go to [email or web address].   ? ?Your next appointment:   ?1 month(s) ? ?The format for your next appointment:   ?In Person ? ?Provider:   ?[name], MD or Murray Hodgkins, NP  ? ? ?Other Instructions ?Cancel appointment tomorrow with [name] and reschedule with her for 2 weeks. That appointment is now 12/20/21 at 09:00 am ? ?Referral for the Advanced Heart Failure clinic in [place].  ? ?Important Information About Sugar ? ? ? ? ?  ?

## 2021-12-06 NOTE — Progress Notes (Signed)
? ? ?Office Visit  ?  ?Patient Name: Gabriel [name]. ?Date of Encounter: 12/06/2021 ? ?Primary Care Provider:  [name], FNP ?Primary Cardiologist:  [name], MD ? ?Chief Complaint  ?  ?51 year old male with a history of CAD, chronic chest pain, chronic troponin elevation, chronic heart failure with reduced ejection fraction, mixed ischemic and nonischemic cardiomyopathy, nonsustained VT, stage III chronic kidney disease, COPD, chronic anticoagulation secondary to unprovoked PE, type 2 diabetes mellitus, and obesity, who presents for follow-up after recent hospitalization for volume overload/heart failure. ? ?Past Medical History  ?  ?Past Medical History:  ?Diagnosis Date  ? CAD (coronary artery disease)   ? a. 04/2015 low risk MV;  b. 12/2016 Cath: minor irregs in LAD/Diag/LCX/OM, RCA 40p/m/d; c. 05/2020 Cath: LM nl, LAD 50d, LCX 30p, OM1 40, RCA 100p w/ L->L collats. CO/CI 3.1/1.3-->Med rx.  ? Chest wall pain, chronic   ? Chronic Troponin Elevation   ? CKD (chronic kidney disease), stage II-III   ? COPD (chronic obstructive pulmonary disease) ([place])   ? Diabetes mellitus without complication ([place])   ? HFrEF (heart failure with reduced ejection fraction) ([place])   ? a. 03/2015 Echo: EF 45-50%; b. 12/2015 Echo: EF 20-25%; c. 02/2016 Echo: EF 30-35%; d. 11/2016 Echo: EF 40-45%; e. 06/2019 Echo: EF 30-35%; f. 11/2019 Echo: EF 25-30%; g. 05/2020 Echo: EF 35-40%; h. 05/2021 Echo: EF 40-45%; i. 11/2021 Echo: EF 20-25%, glob HK, mild LVH, GrIII DD, sev red RV fxn, mild MR.  ? Hypertension   ? Mitral regurgitation   ? Mild to moderate by October 2021 echocardiogram.  ? Mixed Ischemic & NICM (nonischemic cardiomyopathy) ([place])   ? a. 03/2015 Echo: EF 45-50%; b. 12/2015 Echo: EF 20-25%; c. 02/2016 Echo: EF 30-35%; d. 11/2016 Echo: EF 40-45%; e. 06/2019 Echo: EF 30-35%; f. 11/2019 Echo: EF 25-30%; g. 05/2020 Echo: EF 35-40%; h. 05/2021 Echo: EF 40-45%; i. 11/2021 Echo: EF 20-25%  ? Myocardial infarct [facility])   ? NSVT  (nonsustained ventricular tachycardia) ([place])   ? a. 12/2015 noted on tele-->amio;  b. 12/2015 Event monitor: no VT noted.  ? Obesity (BMI 30.0-34.9)   ? Psoriasis   ? Recurrent pulmonary emboli ([place]) 06/07/2020  ? 06/07/20: small bilateral PEs.  12/31/19: RUL and RLL PEs.  ? Syncope   ? a. 01/2016 - felt to be vasovagal.  ? ?Past Surgical History:  ?Procedure Laterality Date  ? AMPUTATION    ? CARDIAC CATHETERIZATION    ? FINGER AMPUTATION    ? Traumatic  ? FINGER FRACTURE SURGERY Left   ? LEFT HEART CATH AND CORONARY ANGIOGRAPHY N/A 01/06/2017  ? Procedure: Left Heart Cath and Coronary Angiography;  Surgeon: Wellington Hampshire, MD;  Location: [facility];  Service: Cardiovascular;  Laterality: N/A;  ? RIGHT/LEFT HEART CATH AND CORONARY ANGIOGRAPHY N/A 06/13/2020  ? Procedure: RIGHT/LEFT HEART CATH AND CORONARY ANGIOGRAPHY;  Surgeon: Nelva Bush, MD;  Location: [place] [facility];  Service: Cardiovascular;  Laterality: N/A;  ? ? ?Allergies ? ?Allergies  ?Allergen Reactions  ? Metformin And Related Nausea And Vomiting  ? Prednisone Other (See Comments)  ?  Reaction: Hallucinations  ?  ? Zantac [Ranitidine Hcl] Diarrhea and Nausea Only  ?  Night sweats  ? ? ?History of Present Illness  ?  ?51 year old male with above complex past medical history including CAD, chronic chest pain, chronic troponin elevation, HFrEF, mixed ischemic and nonischemic cardiomyopathy, nonsustained VT, stage III chronic kidney disease, COPD, unprovoked  PE on chronic anticoagulation, type 2 diabetes mellitus, and obesity.  Over the years, he has had multiple hospitalization for management of chronic HFrEF, chest pain, and elevated troponins.  Prior stress testing in 2016 was nonischemic.  In October 2021, he was admitted with chest pain and elevated troponin above her baseline.  Diagnostic catheterization revealed a chronic total occlusion of the right coronary artery with left-to-right collaterals.  He otherwise had moderate,  nonobstructive disease including a 50% distal stenosis in the LAD, 30% proximal stenosis in the left circumflex, and a 40% OM1 lesion.  He was medically managed.  In April 2022, he required holding of diuretics and Entresto in the setting of hospitalization for acute kidney injury, though these have subsequently been resumed and tolerated.  Follow-up echo in October 2022, in the setting of recurrent hospitalization for heart failure, showed EF 40 to 45%.  He was readmitted again in February 2023 secondary to heart failure, and most recently in early April.  He responded well to IV diuresis and at his request, he was discharged home on April 12 torsemide 40 mg twice daily (previous outpatient dose of 60 mg daily).  Creatinine was trending up prior to discharge and follow-up creatinine today later was higher at 2.08.  We asked the patient to reduce his torsemide back to 60 mg once a day.  Unfortunately, he had weight gain and worsening dyspnea and fatigue over the weekend and was seen in the emergency department on April 17.  He was treated with intravenous Lasix and subsequently discharged. ? ?Since his most recent hospital discharge and subsequent ED visit, he notes fatigue with ongoing dyspnea on exertion.  His weight has been stable at about 234 pounds on his home scale since the emergency department.  He has not had any chest pain, lower extremity edema, or change in abdominal girth.  He tried to go to work 1 day but was too fatigued and dyspneic to continue.  He is concerned that he will need disability.  Though he was previously discharged home on Farxiga, he has not picked up that prescription yet.  He reports compliance with his other medications including Entresto, spironolactone, carvedilol, and torsemide 60 mg daily.  He denies palpitations, PND, orthopnea, dizziness, syncope, or early satiety. ? ?Home Medications  ?  ?Current Outpatient Medications  ?Medication Sig Dispense Refill  ? carvedilol (COREG)  6.25 MG tablet Take 1 tablet (6.25 mg total) by mouth 2 (two) times daily with a meal. 180 tablet 1  ? dapagliflozin propanediol (FARXIGA) 10 MG TABS tablet Take 1 tablet (10 mg total) by mouth daily. 30 tablet 0  ? Dulaglutide (TRULICITY) 1.5 OT/1.5BW SOPN Inject 1.5 mg into the skin once a week. 2 mL 3  ? ezetimibe (ZETIA) 10 MG tablet Take 1 tablet (10 mg total) by mouth daily. 90 tablet 1  ? fenofibrate (TRICOR) 145 MG tablet Take 1 tablet (145 mg total) by mouth daily. 90 tablet 3  ? Insulin Glargine (BASAGLAR KWIKPEN) 100 UNIT/ML Inject 28 Units into the skin at bedtime. 27 mL 3  ? insulin lispro (HUMALOG) 100 UNIT/ML KwikPen Inject 3 Units into the skin 3 (three) times daily. 2.7 mL 2  ? Insulin Pen Needle (PEN NEEDLES) 32G X 4 MM MISC 200 each by Does not apply route at bedtime. 200 each 3  ? Ipratropium-Albuterol (COMBIVENT RESPIMAT) 20-100 MCG/ACT AERS respimat Inhale 1 puff into the lungs every 6 (six) hours as needed for wheezing.    ? metolazone (ZAROXOLYN)  5 MG tablet TAKE 1 TABLET (5 MG TOTAL) BY MOUTH DAILY AS NEEDED (FOR WEIGHT OVER 245). 30 tablet 6  ? nitroGLYCERIN (NITROSTAT) 0.4 MG SL tablet Place 1 tablet (0.4 mg total) under the tongue every 5 (five) minutes x 3 doses as needed for chest pain. 30 tablet 0  ? potassium chloride SA (KLOR-CON M20) 20 MEQ tablet Take 1 tablet daily 30 tablet 6  ? rivaroxaban (XARELTO) 20 MG TABS tablet Take 1 tablet (20 mg total) by mouth daily with supper. 90 tablet 0  ? rosuvastatin (CRESTOR) 20 MG tablet Take 1 tablet (20 mg total) by mouth daily. 90 tablet 3  ? sacubitril-valsartan (ENTRESTO) 24-26 MG Take 1 tablet by mouth 2 (two) times daily. 180 tablet 3  ? sildenafil (VIAGRA) 100 MG tablet Take 1 tablets 1 hour prior to intercourse. 10 tablet 0  ? spironolactone (ALDACTONE) 25 MG tablet Take 1 tablet (25 mg total) by mouth daily. 30 tablet 0  ? Torsemide 40 MG TABS Take 60 mg by mouth daily. 60 tablet 0  ? Continuous Blood Gluc Receiver (FREESTYLE LIBRE 2  READER) DEVI Use to monitor blood sugars as directed (Patient not taking: Reported on 12/06/2021) 1 each 0  ? Continuous Blood Gluc Sensor (FREESTYLE LIBRE 2 SENSOR) MISC Use to monitor blood sugars as directed (Patient n

## 2021-12-07 ENCOUNTER — Ambulatory Visit (INDEPENDENT_AMBULATORY_CARE_PROVIDER_SITE_OTHER): Payer: BC Managed Care – PPO | Admitting: Podiatry

## 2021-12-07 ENCOUNTER — Ambulatory Visit: Payer: BC Managed Care – PPO | Admitting: Family

## 2021-12-07 DIAGNOSIS — M722 Plantar fascial fibromatosis: Secondary | ICD-10-CM | POA: Diagnosis not present

## 2021-12-07 MED ORDER — BETAMETHASONE SOD PHOS & ACET 6 (3-3) MG/ML IJ SUSP
3.0000 mg | Freq: Once | INTRAMUSCULAR | Status: AC
  Administered 2021-12-07: 3 mg via INTRA_ARTICULAR

## 2021-12-07 NOTE — Progress Notes (Signed)
? ?Subjective: ?51 y.o. male presenting today for follow-up evaluation regarding plantar fasciitis to the bilateral arches of the feet.  Patient continues to have pain and tenderness to the arches of the bilateral feet.  He says that recently he was in the hospital for exacerbation of CHF.  He presents for further treatment and evaluation ? ?Past Medical History:  ?Diagnosis Date  ? CAD (coronary artery disease)   ? a. 04/2015 low risk MV;  b. 12/2016 Cath: minor irregs in LAD/Diag/LCX/OM, RCA 40p/m/d; c. 05/2020 Cath: LM nl, LAD 50d, LCX 30p, OM1 40, RCA 100p w/ L->L collats. CO/CI 3.1/1.3-->Med rx.  ? Chest wall pain, chronic   ? Chronic Troponin Elevation   ? CKD (chronic kidney disease), stage II-III   ? COPD (chronic obstructive pulmonary disease) ([place])   ? Diabetes mellitus without complication ([place])   ? HFrEF (heart failure with reduced ejection fraction) ([place])   ? a. 03/2015 Echo: EF 45-50%; b. 12/2015 Echo: EF 20-25%; c. 02/2016 Echo: EF 30-35%; d. 11/2016 Echo: EF 40-45%; e. 06/2019 Echo: EF 30-35%; f. 11/2019 Echo: EF 25-30%; g. 05/2020 Echo: EF 35-40%; h. 05/2021 Echo: EF 40-45%; i. 11/2021 Echo: EF 20-25%, glob HK, mild LVH, GrIII DD, sev red RV fxn, mild MR.  ? Hypertension   ? Mitral regurgitation   ? Mild to moderate by October 2021 echocardiogram.  ? Mixed Ischemic & NICM (nonischemic cardiomyopathy) ([place])   ? a. 03/2015 Echo: EF 45-50%; b. 12/2015 Echo: EF 20-25%; c. 02/2016 Echo: EF 30-35%; d. 11/2016 Echo: EF 40-45%; e. 06/2019 Echo: EF 30-35%; f. 11/2019 Echo: EF 25-30%; g. 05/2020 Echo: EF 35-40%; h. 05/2021 Echo: EF 40-45%; i. 11/2021 Echo: EF 20-25%  ? Myocardial infarct [facility])   ? NSVT (nonsustained ventricular tachycardia) ([place])   ? a. 12/2015 noted on tele-->amio;  b. 12/2015 Event monitor: no VT noted.  ? Obesity (BMI 30.0-34.9)   ? Psoriasis   ? 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 06/07/20: small bilateral PEs.  12/31/19: RUL and RLL PEs.  ? Syncope   ? a. 01/2016 - felt to be vasovagal.   ? ?Past Surgical History:  ?Procedure Laterality Date  ? AMPUTATION    ? CARDIAC CATHETERIZATION    ? FINGER AMPUTATION    ? Traumatic  ? FINGER FRACTURE SURGERY Left   ? LEFT HEART CATH AND CORONARY ANGIOGRAPHY N/A 01/06/2017  ? Procedure: Left Heart Cath and Coronary Angiography;  Surgeon: Wellington Hampshire, MD;  Location: [place] [facility];  Service: Cardiovascular;  Laterality: N/A;  ? RIGHT/LEFT HEART CATH AND CORONARY ANGIOGRAPHY N/A 06/13/2020  ? Procedure: RIGHT/LEFT HEART CATH AND CORONARY ANGIOGRAPHY;  Surgeon: Nelva Bush, MD;  Location: [place] [facility];  Service: Cardiovascular;  Laterality: N/A;  ? ?Allergies  ?Allergen Reactions  ? Metformin And Related Nausea And Vomiting  ? Prednisone Other (See Comments)  ?  Reaction: Hallucinations  ?  ? Zantac [Ranitidine Hcl] Diarrhea and Nausea Only  ?  Night sweats  ? ?Objective: ?Physical Exam ?General: The patient is alert and oriented x3 in no acute distress. ? ?Dermatology: Skin is warm, dry and supple bilateral lower extremities. Negative for open lesions or macerations bilateral.  ? ?Vascular: Dorsalis Pedis and Posterior Tibial pulses palpable bilateral.  Capillary fill time is immediate to all digits. ? ?Neurological: Epicritic and protective threshold intact bilateral.  ? ?Musculoskeletal: There continues to be some residual tenderness to palpation to the plantar aspect of the bilateral heels and along the plantar  fascia. All other joints range of motion within normal limits bilateral. Strength 5/5 in all groups bilateral.  ? ?Assessment: ?1. plantar fasciitis bilateral feet; including along the medial longitudinal arch ? ?Plan of Care:  ?1. Patient evaluated.   ?2. Injection of 0.5cc Celestone soluspan injected into the bilateral heels.  ?3.  Patient allergic to oral steroid.  He says it gives him hallucinations ?4.  No NSAIDs prescribed.  Patient on anticoagulant medication and CKD. ?5.  Continue plantar fascial braces as needed ?6.  OTC  power step insoles were provided and dispensed for the patient at checkout.  Wear daily and his work boots ?7. Return to clinic in 4 weeks.   ? ?*Works for [employer] ? ?Edrick Kins, DPM ?[place] ? ?Dr. Edrick Kins, DPM  ?  ?[address]                                   ?[place], [place] [address]                ?Office [phone]  ?Fax [phone] ? ? ? ? ?

## 2021-12-11 ENCOUNTER — Encounter: Payer: Self-pay | Admitting: *Deleted

## 2021-12-11 ENCOUNTER — Telehealth: Payer: Self-pay | Admitting: Cardiovascular Disease

## 2021-12-11 NOTE — Telephone Encounter (Signed)
Patient called in to see if we could write him a letter for him to work light duties in order to keep his insurance. He is pending disability but needs to keep insurance until his disability is approved. Advised I would send to provider for review and if approved I will have him write letter. He verbalized understanding with no further questions at this time.  ?

## 2021-12-11 NOTE — Telephone Encounter (Signed)
? ?  Pt is requesting to speak with RN Olin Hauser  ?

## 2021-12-12 NOTE — Telephone Encounter (Signed)
Patient was calling back for update. Please advise  

## 2021-12-12 NOTE — Telephone Encounter (Signed)
I called and spoke with the patient. ?He is aware that Ignacia Bayley, NP has completed a letter for him advising he could return to work with light duty and reduced hours. ? ?I read the patient the letter and he advised that he thought this would suffice for now.  ? ?The patient confirms he will come to the office to pick up his letter.  ? ?He is aware this will be at our front desk. ? ?He was very appreciative of the call back. ? ?

## 2021-12-19 ENCOUNTER — Telehealth: Payer: Self-pay | Admitting: Nurse Practitioner

## 2021-12-19 NOTE — Telephone Encounter (Signed)
Received Medical leave of absence forms from Benefits Department at patient's employer. Called to inform patient of $29 fee to start processing the forms. Patient states he was unable to pay the fee, and would touch base with his employer. Received form from Benefits Department [employer], placed in Nurse box and scanned into chart.

## 2021-12-19 NOTE — Progress Notes (Signed)
? Patient ID: Gabriel Emond., male    DOB: 1971-04-17, 51 y.o.   MRN: [id number] ? ?HPI ? ?Gabriel [name] is a 51 y/o male with a history of HTN, diabetes, COPD, CKD, asthma, previous tobacco use and chronic heart failure.  ?  ?Echo results from 05/21/21 reviewed and showed an EF of 40-45% along with mild LVH and mild Gabriel. Echo report from 05/21/21 reviewed and showed an EF of 40-45% along with mild LVH and mild Gabriel. Echo report from 06/08/20 reviewed and showed an EF of 35-40% along with mildly elevated PA pressure of 44.7 mmHg, moderate LAE and mild/moderate Gabriel. Echo report from 11/23/19 reviewed and showed an EF of 25-30% with mild LVH and moderately elevated PA Pressure. Echo report from 06/26/2019 reviewed and showed an EF of 30-35% along with moderately elevated PA pressure. Echo done 04/29/17 showed EF 45-50%. Echo report that was done 11/28/16 and it showed an EF of 40-45% along with mild Gabriel. EF has improved from 30-35% July 2017. ?  ?RHC/LHC done 06/13/20 and showed: ?Severe single-vessel coronary artery disease with chronic total occlusion of the proximal RCA.  The distal vessel fills via left to right collaterals. ?Mild to moderate, nonobstructive CAD involving the LAD and LCx with up to 50% stenosis. ?Mildly elevated left and right heart filling pressures. ?Moderate pulmonary hypertension (mean PAP 40 mmHg) with significantly elevated pulmonary vascular resistance (PVR 7.4 WU). ?Severely reduced Fick cardiac output/index. ?  ?Was in the ED 12/04/21 due to acute on chronic heart failure where he was treated and released. Admitted 11/24/21 due to SOB and abdominal pain. CT scan of the abdomen and liver ultrasound were concerning for cholecystitis.  However, HIDA scan did not show any evidence of cholecystitis.    Cardiology and surgical consults obtained. Initially given IV lasix with transition to oral diuretics. Started on spironolactone. Discharged after 4 days. Admitted 09/27/21 due to worsening SOB due to HF  exacerbation. Initially given IV lasix with transition to oral diuretics with resultant loss of 6L. Cardiology consult obtained. Discharged after 2 days. Admitted 08/09/21 due to acute on chronic heart failure. Initially given IV lasix with transition to oral diuretics. Entresto held due to AKI but then resumed prior to discharge. Discharged after 4 days.  ? ?He presents today for a follow-up visit with a chief complaint of minimal fatigue upon moderate exertion. Describes this as chronic in nature having been present for several years. He has associated shortness of breath, stable chest pain and dizziness along with this. He denies any difficulty sleeping, abdominal distention, palpitations, pedal edema, wheezing, cough or weight gain.  ? ?Says that he's no longer working and is going to start the disability process again. Currently has no income coming in. Ran out of his entresto a few days ago.  ? ?Past Medical History:  ?Diagnosis Date  ? CAD (coronary artery disease)   ? a. 04/2015 low risk MV;  b. 12/2016 Cath: minor irregs in LAD/Diag/LCX/OM, RCA 40p/m/d; c. 05/2020 Cath: LM nl, LAD 50d, LCX 30p, OM1 40, RCA 100p w/ L->L collats. CO/CI 3.1/1.3-->Med rx.  ? Chest wall pain, chronic   ? Chronic Troponin Elevation   ? CKD (chronic kidney disease), stage II-III   ? COPD (chronic obstructive pulmonary disease) ([place])   ? Diabetes mellitus without complication ([place])   ? HFrEF (heart failure with reduced ejection fraction) ([place])   ? a. 03/2015 Echo: EF 45-50%; b. 12/2015 Echo: EF 20-25%; c. 02/2016 Echo: EF 30-35%; d.  11/2016 Echo: EF 40-45%; e. 06/2019 Echo: EF 30-35%; f. 11/2019 Echo: EF 25-30%; g. 05/2020 Echo: EF 35-40%; h. 05/2021 Echo: EF 40-45%; i. 11/2021 Echo: EF 20-25%, glob HK, mild LVH, GrIII DD, sev red RV fxn, mild Gabriel.  ? Hypertension   ? Mitral regurgitation   ? Mild to moderate by October 2021 echocardiogram.  ? Mixed Ischemic & NICM (nonischemic cardiomyopathy) ([place])   ? a. 03/2015 Echo: EF 45-50%; b. 12/2015  Echo: EF 20-25%; c. 02/2016 Echo: EF 30-35%; d. 11/2016 Echo: EF 40-45%; e. 06/2019 Echo: EF 30-35%; f. 11/2019 Echo: EF 25-30%; g. 05/2020 Echo: EF 35-40%; h. 05/2021 Echo: EF 40-45%; i. 11/2021 Echo: EF 20-25%  ? Myocardial infarct [facility])   ? NSVT (nonsustained ventricular tachycardia) ([place])   ? a. 12/2015 noted on tele-->amio;  b. 12/2015 Event monitor: no VT noted.  ? Obesity (BMI 30.0-34.9)   ? Psoriasis   ? 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 06/07/20: small bilateral PEs.  12/31/19: RUL and RLL PEs.  ? Syncope   ? a. 01/2016 - felt to be vasovagal.  ? ?Past Surgical History:  ?Procedure Laterality Date  ? AMPUTATION    ? CARDIAC CATHETERIZATION    ? FINGER AMPUTATION    ? Traumatic  ? FINGER FRACTURE SURGERY Left   ? LEFT HEART CATH AND CORONARY ANGIOGRAPHY N/A 01/06/2017  ? Procedure: Left Heart Cath and Coronary Angiography;  Surgeon: Wellington Hampshire, MD;  Location: [place] [facility];  Service: Cardiovascular;  Laterality: N/A;  ? RIGHT/LEFT HEART CATH AND CORONARY ANGIOGRAPHY N/A 06/13/2020  ? Procedure: RIGHT/LEFT HEART CATH AND CORONARY ANGIOGRAPHY;  Surgeon: Nelva Bush, MD;  Location: [place] [facility];  Service: Cardiovascular;  Laterality: N/A;  ? ?Family History  ?Problem Relation Age of Onset  ? Diabetes Mother   ? Diabetes Mellitus II Mother   ? Hypothyroidism Mother   ? Hypertension Mother   ? Kidney failure Mother   ?     Dialysis  ? Heart attack Mother   ?     17 yo approximately  ? Hypertension Father   ? Gout Father   ? Cancer Maternal Grandfather   ? Diabetes Maternal Grandfather   ? Cancer Paternal Aunt   ? ?Social History  ? ?Tobacco Use  ? Smoking status: Former  ?  Packs/day: 0.50  ?  Years: 33.00  ?  Pack years: 16.50  ?  Types: Cigarettes  ?  Quit date: 05/08/2020  ?  Years since quitting: 1.6  ? Smokeless tobacco: Former  ?  Quit date: 05/08/2020  ? Tobacco comments:  ?  Quit Sept 2021  ?Substance Use Topics  ? Alcohol use: Yes  ?  Alcohol/week: 0.0 standard drinks  ?   Comment: occassionally  ? ?Allergies  ?Allergen Reactions  ? Metformin And Related Nausea And Vomiting  ? Prednisone Other (See Comments)  ?  Reaction: Hallucinations  ?  ? Zantac [Ranitidine Hcl] Diarrhea and Nausea Only  ?  Night sweats  ? ?Prior to Admission medications   ?Medication Sig Start Date End Date Taking? Authorizing Provider  ?carvedilol (COREG) 6.25 MG tablet Take 1 tablet (6.25 mg total) by mouth 2 (two) times daily with a meal. 11/05/21  Yes [name], FNP  ?Continuous Blood Gluc Receiver (FREESTYLE LIBRE 2 READER) DEVI Use to monitor blood sugars as directed 10/02/21  Yes Tally Joe T, FNP  ?dapagliflozin propanediol (FARXIGA) 10 MG TABS tablet Take 1 tablet (10 mg total) by  mouth daily. 11/29/21  Yes Jennye Boroughs, MD  ?Dulaglutide (TRULICITY) 1.5 WY/6.1UO SOPN Inject 1.5 mg into the skin once a week. 10/08/21  Yes Tally Joe T, FNP  ?ezetimibe (ZETIA) 10 MG tablet Take 1 tablet (10 mg total) by mouth daily. 11/05/21  Yes [name], FNP  ?fenofibrate (TRICOR) 145 MG tablet Take 1 tablet (145 mg total) by mouth daily. 09/05/21  Yes [name], FNP  ?Insulin Glargine (BASAGLAR KWIKPEN) 100 UNIT/ML Inject 28 Units into the skin at bedtime. 09/05/21 09/26/22 Yes [name], FNP  ?insulin lispro (HUMALOG) 100 UNIT/ML KwikPen Inject 3 Units into the skin 3 (three) times daily. 08/13/21  Yes [name], MD  ?Insulin Pen Needle (PEN NEEDLES) 32G X 4 MM MISC 200 each by Does not apply route at bedtime. 09/05/21  Yes [name], FNP  ?Ipratropium-Albuterol (COMBIVENT RESPIMAT) 20-100 MCG/ACT AERS respimat Inhale 1 puff into the lungs every 6 (six) hours as needed for wheezing.   Yes [provider]  ?nitroGLYCERIN (NITROSTAT) 0.4 MG SL tablet Place 1 tablet (0.4 mg total) under the tongue every 5 (five) minutes x 3 doses as needed for chest pain. 06/09/20  Yes Lorella Nimrod, MD  ?rivaroxaban (XARELTO) 20 MG TABS tablet Take 1 tablet (20 mg total) by mouth daily with supper. 08/13/21   Yes [name], MD  ?rosuvastatin (CRESTOR) 20 MG tablet Take 1 tablet (20 mg total) by mouth daily. 09/05/21  Yes [name], FNP  ?sildenafil (VIAGRA) 100 MG tablet Take 1 tablets 1 hour prior to intercour

## 2021-12-20 ENCOUNTER — Ambulatory Visit: Payer: BC Managed Care – PPO | Attending: Family | Admitting: Family

## 2021-12-20 ENCOUNTER — Encounter: Payer: Self-pay | Admitting: Family

## 2021-12-20 ENCOUNTER — Telehealth: Payer: Self-pay | Admitting: Pharmacy Technician

## 2021-12-20 ENCOUNTER — Other Ambulatory Visit: Payer: Self-pay

## 2021-12-20 VITALS — BP 124/66 | HR 71 | Resp 16 | Ht 72.0 in | Wt 234.4 lb

## 2021-12-20 DIAGNOSIS — I10 Essential (primary) hypertension: Secondary | ICD-10-CM

## 2021-12-20 DIAGNOSIS — I130 Hypertensive heart and chronic kidney disease with heart failure and stage 1 through stage 4 chronic kidney disease, or unspecified chronic kidney disease: Secondary | ICD-10-CM | POA: Diagnosis not present

## 2021-12-20 DIAGNOSIS — I272 Pulmonary hypertension, unspecified: Secondary | ICD-10-CM | POA: Insufficient documentation

## 2021-12-20 DIAGNOSIS — I2510 Atherosclerotic heart disease of native coronary artery without angina pectoris: Secondary | ICD-10-CM | POA: Insufficient documentation

## 2021-12-20 DIAGNOSIS — E1122 Type 2 diabetes mellitus with diabetic chronic kidney disease: Secondary | ICD-10-CM | POA: Insufficient documentation

## 2021-12-20 DIAGNOSIS — N189 Chronic kidney disease, unspecified: Secondary | ICD-10-CM | POA: Diagnosis not present

## 2021-12-20 DIAGNOSIS — I5022 Chronic systolic (congestive) heart failure: Secondary | ICD-10-CM | POA: Diagnosis not present

## 2021-12-20 DIAGNOSIS — J449 Chronic obstructive pulmonary disease, unspecified: Secondary | ICD-10-CM | POA: Insufficient documentation

## 2021-12-20 DIAGNOSIS — Z87891 Personal history of nicotine dependence: Secondary | ICD-10-CM | POA: Insufficient documentation

## 2021-12-20 DIAGNOSIS — N1831 Chronic kidney disease, stage 3a: Secondary | ICD-10-CM

## 2021-12-20 MED ORDER — SACUBITRIL-VALSARTAN 24-26 MG PO TABS: 1.0000 | ORAL_TABLET | Freq: Two times a day (BID) | ORAL | 3 refills | Status: DC

## 2021-12-20 NOTE — Telephone Encounter (Signed)
Patient came into the office and states that he wants to hold off on the Leave of Absence for [employer] as of now. He wants to talk with his employer and then he will let us know if to proceed or not.I placed forms in front office notebook labeled "Patient form requests"

## 2021-12-20 NOTE — Patient Instructions (Signed)
Continue weighing daily and call for an overnight weight gain of 3 pounds or more or a weekly weight gain of more than 5 pounds.   If you have voicemail, please make sure your mailbox is cleaned out so that we may leave a message and please make sure to listen to any voicemails.     

## 2021-12-21 NOTE — Telephone Encounter (Signed)
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 He said we can shred the forms. He appreciates our help  ?

## 2021-12-25 ENCOUNTER — Other Ambulatory Visit: Payer: Self-pay

## 2022-01-07 ENCOUNTER — Telehealth: Payer: Self-pay | Admitting: Family

## 2022-01-07 NOTE — Telephone Encounter (Signed)
Patient was approved for patient assistance for Entresto until 01/08/2023 and will receive them for free in the mail. I instructed patient on how to get his medications filled and mailed to him.   Amarri Satterly, NT

## 2022-01-11 ENCOUNTER — Ambulatory Visit: Payer: BC Managed Care – PPO | Admitting: Nurse Practitioner

## 2022-01-15 ENCOUNTER — Other Ambulatory Visit: Payer: Self-pay | Admitting: Family

## 2022-01-15 ENCOUNTER — Ambulatory Visit (INDEPENDENT_AMBULATORY_CARE_PROVIDER_SITE_OTHER): Payer: BC Managed Care – PPO | Admitting: Medical

## 2022-01-15 ENCOUNTER — Encounter: Payer: Self-pay | Admitting: Medical

## 2022-01-15 ENCOUNTER — Other Ambulatory Visit: Payer: Self-pay

## 2022-01-15 VITALS — BP 130/80 | HR 77 | Ht 72.0 in | Wt 238.0 lb

## 2022-01-15 DIAGNOSIS — E1169 Type 2 diabetes mellitus with other specified complication: Secondary | ICD-10-CM

## 2022-01-15 DIAGNOSIS — I2510 Atherosclerotic heart disease of native coronary artery without angina pectoris: Secondary | ICD-10-CM | POA: Diagnosis not present

## 2022-01-15 DIAGNOSIS — I428 Other cardiomyopathies: Secondary | ICD-10-CM | POA: Diagnosis not present

## 2022-01-15 DIAGNOSIS — I10 Essential (primary) hypertension: Secondary | ICD-10-CM

## 2022-01-15 DIAGNOSIS — E782 Mixed hyperlipidemia: Secondary | ICD-10-CM

## 2022-01-15 DIAGNOSIS — Z86711 Personal history of pulmonary embolism: Secondary | ICD-10-CM

## 2022-01-15 DIAGNOSIS — I5022 Chronic systolic (congestive) heart failure: Secondary | ICD-10-CM

## 2022-01-15 DIAGNOSIS — N183 Chronic kidney disease, stage 3 unspecified: Secondary | ICD-10-CM

## 2022-01-15 DIAGNOSIS — G4733 Obstructive sleep apnea (adult) (pediatric): Secondary | ICD-10-CM

## 2022-01-15 MED ORDER — RIVAROXABAN 20 MG PO TABS
20.0000 mg | ORAL_TABLET | Freq: Every day | ORAL | 3 refills | Status: DC
  Filled 2022-01-15: qty 30, 30d supply, fill #0
  Filled 2022-03-26 – 2022-03-28 (×2): qty 30, 30d supply, fill #1

## 2022-01-15 MED ORDER — SPIRONOLACTONE 25 MG PO TABS
25.0000 mg | ORAL_TABLET | Freq: Every day | ORAL | 3 refills | Status: DC
  Filled 2022-01-15: qty 30, 30d supply, fill #0

## 2022-01-15 MED ORDER — CARVEDILOL 6.25 MG PO TABS
6.2500 mg | ORAL_TABLET | Freq: Two times a day (BID) | ORAL | 3 refills | Status: DC
  Filled 2022-01-15: qty 60, 30d supply, fill #0

## 2022-01-15 MED ORDER — TORSEMIDE 20 MG PO TABS
40.0000 mg | ORAL_TABLET | Freq: Two times a day (BID) | ORAL | 3 refills | Status: DC
Start: 2022-01-15 — End: 2022-06-14
  Filled 2022-01-15: qty 120, 30d supply, fill #0
  Filled 2022-01-21: qty 240, 30d supply, fill #0
  Filled 2022-01-21: qty 200, 30d supply, fill #0

## 2022-01-15 MED ORDER — FENOFIBRATE 145 MG PO TABS
145.0000 mg | ORAL_TABLET | Freq: Every day | ORAL | 3 refills | Status: DC
  Filled 2022-01-15: qty 30, 30d supply, fill #0
  Filled 2022-03-26: qty 30, 30d supply, fill #1

## 2022-01-15 MED ORDER — POTASSIUM CHLORIDE CRYS ER 20 MEQ PO TBCR
EXTENDED_RELEASE_TABLET | ORAL | 3 refills | Status: DC
  Filled 2022-01-15: qty 30, 30d supply, fill #0

## 2022-01-15 MED ORDER — ROSUVASTATIN CALCIUM 20 MG PO TABS
20.0000 mg | ORAL_TABLET | Freq: Every day | ORAL | 3 refills | Status: DC
  Filled 2022-01-15: qty 30, 30d supply, fill #0
  Filled 2022-03-26 – 2022-03-28 (×3): qty 30, 30d supply, fill #1

## 2022-01-15 MED ORDER — DAPAGLIFLOZIN PROPANEDIOL 10 MG PO TABS
10.0000 mg | ORAL_TABLET | Freq: Every day | ORAL | 3 refills | Status: DC
  Filled 2022-01-15: qty 30, 30d supply, fill #0
  Filled 2022-03-26 – 2022-04-17 (×5): qty 30, 30d supply, fill #1

## 2022-01-15 MED ORDER — EZETIMIBE 10 MG PO TABS
10.0000 mg | ORAL_TABLET | Freq: Every day | ORAL | 3 refills | Status: DC
  Filled 2022-01-15: qty 30, 30d supply, fill #0
  Filled 2022-03-26 – 2022-03-28 (×2): qty 30, 30d supply, fill #1

## 2022-01-15 NOTE — Patient Instructions (Addendum)
Medication Instructions:  Your physician has recommended you make the following change in your medication:   Take Torsemide 80 mg twice a day for 3 days THEN go to Torsemide 40 mg twice a day  *If you need a refill on your cardiac medications before your next appointment, please call your pharmacy*   Lab Work: None  If you have labs (blood work) drawn today and your tests are completely normal, you will receive your results only by: [place] (if you have MyChart) OR A paper copy in the mail If you have any lab test that is abnormal or we need to change your treatment, we will call you to review the results.   Testing/Procedures: Your physician has requested that you have an echocardiogram. Echocardiography is a painless test that uses sound waves to create images of your heart. It provides your doctor with information about the size and shape of your heart and how well your heart's chambers and valves are working. This procedure takes approximately one hour. There are no restrictions for this procedure.    Follow-Up: At [facility], you and your health needs are our priority.  As part of our continuing mission to provide you with exceptional heart care, we have created designated Provider Care Teams.  These Care Teams include your primary Cardiologist (physician) and Advanced Practice Providers (APPs -  Physician Assistants and Nurse Practitioners) who all work together to provide you with the care you need, when you need it.  We recommend signing up for the patient portal called "MyChart".  Sign up information is provided on this After Visit Summary.  MyChart is used to connect with patients for Virtual Visits (Telemedicine).  Patients are able to view lab/test results, encounter notes, upcoming appointments, etc.  Non-urgent messages can be sent to your provider as well.   To learn more about what you can do with MyChart, go to [email or web address].    Your next  appointment:   1 month(s)  The format for your next appointment:   In Person  Provider:   [name], MD or Cadence Kathlen Mody, [place]       Important Information About Sugar

## 2022-01-15 NOTE — Progress Notes (Signed)
Cardiology Office Note:    Date:  01/21/2022   ID:  Gabriel Righter., DOB 08/12/1971, MRN [id number]  PCP:  [name], FNP  [facility] Cardiologist:  [name], MD  [facility] Electrophysiologist:  None   Referring MD: [name], FNP   Chief Complaint: 1 month follow-up  History of Present Illness:    Gabriel Umholtz. is a 51 y.o. male with a hx of CAD, chronic chest pain, chronic troponin elevation, chronic heart failure with reduced ejection fraction, mixed ischemic and nonischemic cardiomyopathy, nonsustained VT, stage III chronic kidney disease, COPD, chronic anticoagulation secondary to unprovoked PE, type 2 diabetes mellitus, and obesity who presents for 1 month follow-up.    Prior stress testing in 2016 was nonischemic.  In October 2021, he was admitted with chest pain and elevated troponin above her baseline.  Diagnostic catheterization revealed a chronic total occlusion of the right coronary artery with left-to-right collaterals.  He otherwise had moderate, nonobstructive disease including a 50% distal stenosis in the LAD, 30% proximal stenosis in the left circumflex, and a 40% OM1 lesion.  He was medically managed.  In April 2022, he required holding of diuretics and Entresto in the setting of hospitalization for acute kidney injury, though these have subsequently been resumed and tolerated.  Follow-up echo in October 2022, in the setting of recurrent hospitalization for heart failure, showed EF 40 to 45%.  He was readmitted again in February 2023 secondary to heart failure, and most recently in early April.  He responded well to IV diuresis and at his request, he was discharged home on April 12 torsemide 40 mg twice daily (previous outpatient dose of 60 mg daily).  Creatinine was trending up prior to discharge and follow-up creatinine today later was higher at 2.08.  We asked the patient to reduce his torsemide back to 60 mg once a day.  Unfortunately, he had  weight gain and worsening dyspnea and fatigue over the weekend and was seen in the emergency department on April 18.  He was treated with intravenous Lasix and subsequently discharged.  Last seen 12/06/21 and noted ongoing fatigue and dyspnea on exertion. Weight was stable around 234 lbs. Had not started [place]. Plan for repeat echo on GDMT.   Saw [name] 12/20/21 and was overall stable.   Today, the patient reports constant headache, he has a history of migraines. Tylenol is not helping it, may need to speak to this PCP. He has chronic chest pain that is relatively unchanged. Breathing is stable, he ha [id number] short of breath on activity. Weight has been on the higher side. He has been taking torsemide '40mg'$  BID for the last month.  Past Medical History:  Diagnosis Date   CAD (coronary artery disease)    a. 04/2015 low risk MV;  b. 12/2016 Cath: minor irregs in LAD/Diag/LCX/OM, RCA 40p/m/d; c. 05/2020 Cath: LM nl, LAD 50d, LCX 30p, OM1 40, RCA 100p w/ L->R collats. CO/CI 3.1/1.3-->Med rx.   Chest wall pain, chronic    Chronic Troponin Elevation    CKD (chronic kidney disease), stage II-III    COPD (chronic obstructive pulmonary disease) (HCC)    Diabetes mellitus without complication ([place])    HFrEF (heart failure with reduced ejection fraction) ([place])    a. 03/2015 Echo: EF 45-50%; b. 12/2015 Echo: EF 20-25%; c. 02/2016 Echo: EF 30-35%; d. 11/2016 Echo: EF 40-45%; e. 06/2019 Echo: EF 30-35%; f. 11/2019 Echo: EF 25-30%; g. 05/2020 Echo: EF 35-40%;  h. 05/2021 Echo: EF 40-45%; i. 11/2021 Echo: EF 20-25%, glob HK, mild LVH, GrIII DD, sev red RV fxn, mild MR.   Hypertension    Mitral regurgitation    Mild to moderate by October 2021 echocardiogram.   Mixed Ischemic & NICM (nonischemic cardiomyopathy) ([place])    a. 03/2015 Echo: EF 45-50%; b. 12/2015 Echo: EF 20-25%; c. 02/2016 Echo: EF 30-35%; d. 11/2016 Echo: EF 40-45%; e. 06/2019 Echo: EF 30-35%; f. 11/2019 Echo: EF 25-30%; g. 05/2020 Echo: EF 35-40%; h.  05/2021 Echo: EF 40-45%; i. 11/2021 Echo: EF 20-25%   Myocardial infarct (HCC)    NSVT (nonsustained ventricular tachycardia) ([place])    a. 12/2015 noted on tele-->amio;  b. 12/2015 Event monitor: no VT noted.   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  12/31/19: RUL and RLL PEs.   Syncope    a. 01/2016 - felt to be vasovagal.    Past Surgical History:  Procedure Laterality Date   AMPUTATION     CARDIAC CATHETERIZATION     FINGER AMPUTATION     Traumatic   FINGER FRACTURE SURGERY Left    LEFT HEART CATH AND CORONARY ANGIOGRAPHY N/A 01/06/2017   Procedure: Left Heart Cath and Coronary Angiography;  Surgeon: Wellington Hampshire, MD;  Location: [place] [facility];  Service: Cardiovascular;  Laterality: N/A;   RIGHT/LEFT HEART CATH AND CORONARY ANGIOGRAPHY N/A 06/13/2020   Procedure: RIGHT/LEFT HEART CATH AND CORONARY ANGIOGRAPHY;  Surgeon: Nelva Bush, MD;  Location: [facility];  Service: Cardiovascular;  Laterality: N/A;    Current Medications: Current Meds  Medication Sig   Dulaglutide (TRULICITY) 1.5 WG/9.5AO SOPN Inject 1.5 mg into the skin once a week.   Insulin Pen Needle (PEN NEEDLES) 32G X 4 MM MISC 200 each by Does not apply route at bedtime.   Ipratropium-Albuterol (COMBIVENT RESPIMAT) 20-100 MCG/ACT AERS respimat Inhale 1 puff into the lungs every 6 (six) hours as needed for wheezing.   nitroGLYCERIN (NITROSTAT) 0.4 MG SL tablet Place 1 tablet (0.4 mg total) under the tongue every 5 (five) minutes x 3 doses as needed for chest pain.   sacubitril-valsartan (ENTRESTO) 24-26 MG Take 1 tablet by mouth 2 (two) times daily.   sildenafil (VIAGRA) 100 MG tablet Take 1 tablets 1 hour prior to intercourse. (Patient not taking: Reported on 01/21/2022)   [DISCONTINUED] carvedilol (COREG) 6.25 MG tablet Take 1 tablet (6.25 mg total) by mouth 2 (two) times daily with a meal.   [DISCONTINUED] Continuous Blood  Gluc Receiver (FREESTYLE LIBRE 2 READER) DEVI Use to monitor blood sugars as directed   [DISCONTINUED] dapagliflozin propanediol (FARXIGA) 10 MG TABS tablet Take 1 tablet (10 mg total) by mouth daily.   [DISCONTINUED] ezetimibe (ZETIA) 10 MG tablet Take 1 tablet (10 mg total) by mouth daily.   [DISCONTINUED] fenofibrate (TRICOR) 145 MG tablet Take 1 tablet (145 mg total) by mouth daily.   [DISCONTINUED] Insulin Glargine (BASAGLAR KWIKPEN) 100 UNIT/ML Inject 28 Units into the skin at bedtime.   [DISCONTINUED] insulin lispro (HUMALOG) 100 UNIT/ML KwikPen Inject 3 Units into the skin 3 (three) times daily.   [DISCONTINUED] rivaroxaban (XARELTO) 20 MG TABS tablet Take 1 tablet (20 mg total) by mouth daily with supper.   [DISCONTINUED] rosuvastatin (CRESTOR) 20 MG tablet Take 1 tablet (20 mg total) by mouth daily.   [DISCONTINUED] spironolactone (ALDACTONE) 25 MG tablet Take 1 tablet (25 mg total) by mouth daily.   [  DISCONTINUED] Torsemide 40 MG TABS Take 60 mg by mouth daily. (Patient taking differently: Take 80 mg by mouth daily. 40 mg in the AM and 40 mg in the afternoon)     Allergies:   Metformin and related, Prednisone, and Zantac [ranitidine hcl]   Social History   Socioeconomic History   Marital status: Widowed    Spouse name: Not on file   Number of children: 1   Years of education: 20   Highest education level: 12th grade  Occupational History   Occupation: disabled  Tobacco Use   Smoking status: Former    Packs/day: 0.50    Years: 33.00    Pack years: 16.50    Types: Cigarettes    Quit date: 05/08/2020    Years since quitting: 1.7   Smokeless tobacco: Former    Quit date: 05/08/2020   Tobacco comments:    Quit Sept 2021  Vaping Use   Vaping Use: Former  Substance and Sexual Activity   Alcohol use: Not Currently    Comment: occassionally   Drug use: No   Sexual activity: Not Currently  Other Topics Concern   Not on file  Social History Narrative   Lives at home with  wife and his dad's wife.        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    Social Determinants of Health   Financial Resource Strain: Not on file  Food Insecurity: Not on file  Transportation Needs: Not on file  Physical Activity: Not on file  Stress: Not on file  Social Connections: Not on file     Family History: The patient's family history includes Cancer in his maternal grandfather and paternal aunt; Diabetes in his maternal grandfather and mother; Diabetes Mellitus II in his mother; Gout in his father; Heart attack in his mother; Hypertension in his father and mother; Hypothyroidism in his mother; Kidney failure in his mother.  ROS:   Please see the history of present illness.     All other systems reviewed and are negative.  EKGs/Labs/Other Studies Reviewed:    The following studies were reviewed today:  Echo 11/24/21   1. Left ventricular ejection fraction, by estimation, is 20 to 25%. The  left ventricle has severely decreased function. The left ventricle  demonstrates global hypokinesis. The left ventricular internal cavity size  was moderately to severely dilated.  There is mild left ventricular hypertrophy. Left ventricular diastolic  parameters are consistent with Grade III diastolic dysfunction  (restrictive).   2. Right ventricular systolic function is severely reduced. The right  ventricular size is moderately enlarged.   3. Left atrial size was mildly dilated.   4. Right atrial size was mildly dilated.   5. The mitral valve is grossly normal. Mild mitral valve regurgitation.   6. The aortic valve is normal in structure. Aortic valve regurgitation is  moderate. No aortic stenosis is present.   Echo 05/2021  1. Left ventricular ejection fraction, by estimation, is 40 to 45%. The  left ventricle has mildly decreased function. Left ventricular endocardial  border not optimally defined to evaluate  regional wall motion. The left  ventricular internal cavity size  was mildly dilated. There is mild left ventricular hypertrophy. Left  ventricular diastolic parameters were normal.   2. Right ventricular systolic function is normal. The right ventricular  size is normal. Tricuspid regurgitation signal is inadequate for assessing  PA pressure.   3.  Left atrial size was mild to moderately dilated.   4. Right atrial size was mildly dilated.   5. The mitral valve is normal in structure. Mild mitral valve  regurgitation. No evidence of mitral stenosis.   6. The aortic valve is normal in structure. Aortic valve regurgitation is  mild. Mild to moderate aortic valve sclerosis/calcification is present,  without any evidence of aortic stenosis.  LHC 05/2020  Conclusions: Severe single-vessel coronary artery disease with chronic total occlusion of the proximal RCA.  The distal vessel fills via left to right collaterals. Mild to moderate, nonobstructive CAD involving the LAD and LCx with up to 50% stenosis. Mildly elevated left and right heart filling pressures. Moderate pulmonary hypertension (mean PAP 40 mmHg) with significantly elevated pulmonary vascular resistance (PVR 7.4 WU). Severely reduced Fick cardiac output/index.   Recommendations: Continue IV diuresis for at least 1 more day. Given severely reduced cardiac output, I will decrease carvedilol to 6.5 mg twice daily.  If blood pressure tolerates and renal function is stable, consider further escalation of Entresto. Restart IV heparin 2 hours after TR band removal.  If no evidence of bleeding or vascular complication, the patient can be transitioned back to [facility] tomorrow. 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  He was evaluated by Dr. Aundra Dubin in June but failed to follow-up since then. Aggressive  secondary prevention of coronary artery disease.  Favor medical management of CTO of RCA, which is new since 2018.   Nelva Bush, MD [facility]   Coronary Diagrams  Diagnostic Dominance: Right Intervention   EKG:  EKG is  ordered today.  The ekg ordered today demonstrates NSR 77bpm, IVCD, nonspecific ST/T wave changes  Recent Labs: 11/05/2021: TSH 1.270 12/04/2021: ALT 61; B Natriuretic Peptide 243.0 01/17/2022: Magnesium 2.4 01/19/2022: BUN 61; Creatinine, Ser 2.06; Hemoglobin 15.1; Platelets 132; Potassium 3.9; Sodium 135  Recent Lipid Panel    Component Value Date/Time   CHOL 110 11/25/2021 0620   CHOL 157 11/05/2021 1403   TRIG 153 (H) 11/25/2021 0620   HDL 17 (L) 11/25/2021 0620   HDL 28 (L) 11/05/2021 1403   CHOLHDL 6.5 11/25/2021 0620   VLDL 31 11/25/2021 0620   LDLCALC 62 11/25/2021 0620   LDLCALC 56 11/05/2021 1403   LDLDIRECT 60.6 11/23/2020 2101   Physical Exam:    VS:  BP 130/80 (BP Location: Left Arm, Patient Position: Sitting, Cuff Size: Normal)   Pulse 77   Ht 6' (1.829 m)   Wt 238 lb (108 kg)   SpO2 98%   BMI 32.28 kg/m     Wt Readings from Last 3 Encounters:  01/21/22 235 lb 2 oz (106.7 kg)  01/18/22 235 lb 3.2 oz (106.7 kg)  01/15/22 238 lb (108 kg)     GEN:  Well nourished, well developed in no acute distress HEENT: Normal NECK: No JVD; No carotid bruits LYMPHATICS: No lymphadenopathy CARDIAC: RRR, no murmurs, rubs, gallops RESPIRATORY:  Clear to auscultation without rales, wheezing or rhonchi  ABDOMEN: Soft, non-tender, non-distended MUSCULOSKELETAL:  No edema; No deformity  SKIN: Warm and dry NEUROLOGIC:  Alert and oriented x 3 PSYCHIATRIC:  Normal affect   ASSESSMENT:    1. Chronic systolic heart failure ([place])   2. NICM (nonischemic cardiomyopathy) ([name])   3. Coronary artery disease involving native coronary artery of native heart without angina pectoris   4. Essential hypertension   5. Hyperlipidemia, mixed  6. OSA  (obstructive sleep apnea)   7. Stage 3 chronic kidney disease, unspecified whether stage 3a or 3b CKD ([place])   8. History of pulmonary embolism    PLAN:    In order of problems listed above:  HFrEF Mixed ICM/NICM Echo in April showed decreased EF 20-25%. He is on GDMT with Lisabeth Register, spironolactone and Coreg. Reports weights are up to 238lbs, prior was 234lbs. No obvious volume overload on exam. We will increase Torsemide to '80mg'$  BID x3 days then back down to '40mg'$  BID. I will re-check an echo to reassess pump function. If EF is still low, plan to refer to EP to revisit ICD.   CAD known CTO of RCA Last cath in October showed known CTO RCA with otherwise nonobstructive disease. He has chronic chest pain which seems to be worse recently.EKG with no new changes today. We discussed Imdur, but want to avoid this given headaches. Myoview Lexiscan was also discussed, but we will defer at this time and revisit at follow-up. Continue BB and statin therapy. No ASA with Xarelto  HTN BP is good today. Continue current medications.   HLD LDL 62 in April 2023. Continue statin therapy.   H/o unprovoked PE/DVT Continue chronic Xarelto.  OSA Previously declined CPAP.  CKD stage 3 Baseline Scr around 1.4.    Disposition: Follow up in 1 month(s) with MD/APP     Signed, Sharlisa Hollifield Ninfa Meeker, PA-C  01/21/2022 12:50 PM    [facility]

## 2022-01-15 NOTE — Progress Notes (Signed)
Meds refilled per patient request

## 2022-01-17 ENCOUNTER — Other Ambulatory Visit: Payer: Self-pay

## 2022-01-17 ENCOUNTER — Emergency Department: Payer: BC Managed Care – PPO

## 2022-01-17 ENCOUNTER — Inpatient Hospital Stay
Admission: EM | Admit: 2022-01-17 | Discharge: 2022-01-19 | DRG: 638 | Disposition: A | Discharge status: Home / Self Care | Payer: BC Managed Care – PPO | Attending: Internal Medicine | Admitting: Internal Medicine

## 2022-01-17 ENCOUNTER — Encounter: Payer: Self-pay | Admitting: *Deleted

## 2022-01-17 DIAGNOSIS — I2510 Atherosclerotic heart disease of native coronary artery without angina pectoris: Secondary | ICD-10-CM | POA: Diagnosis present

## 2022-01-17 DIAGNOSIS — I447 Left bundle-branch block, unspecified: Secondary | ICD-10-CM | POA: Diagnosis present

## 2022-01-17 DIAGNOSIS — I10 Essential (primary) hypertension: Secondary | ICD-10-CM | POA: Diagnosis present

## 2022-01-17 DIAGNOSIS — Z833 Family history of diabetes mellitus: Secondary | ICD-10-CM | POA: Diagnosis not present

## 2022-01-17 DIAGNOSIS — I252 Old myocardial infarction: Secondary | ICD-10-CM | POA: Diagnosis not present

## 2022-01-17 DIAGNOSIS — E1122 Type 2 diabetes mellitus with diabetic chronic kidney disease: Secondary | ICD-10-CM | POA: Diagnosis not present

## 2022-01-17 DIAGNOSIS — Z86711 Personal history of pulmonary embolism: Secondary | ICD-10-CM | POA: Diagnosis not present

## 2022-01-17 DIAGNOSIS — Z8249 Family history of ischemic heart disease and other diseases of the circulatory system: Secondary | ICD-10-CM

## 2022-01-17 DIAGNOSIS — Z87891 Personal history of nicotine dependence: Secondary | ICD-10-CM

## 2022-01-17 DIAGNOSIS — E871 Hypo-osmolality and hyponatremia: Secondary | ICD-10-CM | POA: Diagnosis present

## 2022-01-17 DIAGNOSIS — N189 Chronic kidney disease, unspecified: Secondary | ICD-10-CM

## 2022-01-17 DIAGNOSIS — E1165 Type 2 diabetes mellitus with hyperglycemia: Principal | ICD-10-CM

## 2022-01-17 DIAGNOSIS — I428 Other cardiomyopathies: Secondary | ICD-10-CM | POA: Diagnosis not present

## 2022-01-17 DIAGNOSIS — Z6831 Body mass index (BMI) 31.0-31.9, adult: Secondary | ICD-10-CM

## 2022-01-17 DIAGNOSIS — I5022 Chronic systolic (congestive) heart failure: Secondary | ICD-10-CM | POA: Diagnosis present

## 2022-01-17 DIAGNOSIS — Z7901 Long term (current) use of anticoagulants: Secondary | ICD-10-CM

## 2022-01-17 DIAGNOSIS — G8929 Other chronic pain: Secondary | ICD-10-CM | POA: Diagnosis not present

## 2022-01-17 DIAGNOSIS — R0789 Other chest pain: Secondary | ICD-10-CM | POA: Diagnosis present

## 2022-01-17 DIAGNOSIS — N183 Chronic kidney disease, stage 3 unspecified: Secondary | ICD-10-CM | POA: Diagnosis not present

## 2022-01-17 DIAGNOSIS — E878 Other disorders of electrolyte and fluid balance, not elsewhere classified: Secondary | ICD-10-CM | POA: Diagnosis not present

## 2022-01-17 DIAGNOSIS — E876 Hypokalemia: Secondary | ICD-10-CM | POA: Diagnosis present

## 2022-01-17 DIAGNOSIS — I130 Hypertensive heart and chronic kidney disease with heart failure and stage 1 through stage 4 chronic kidney disease, or unspecified chronic kidney disease: Secondary | ICD-10-CM | POA: Diagnosis present

## 2022-01-17 DIAGNOSIS — N179 Acute kidney failure, unspecified: Secondary | ICD-10-CM | POA: Diagnosis not present

## 2022-01-17 DIAGNOSIS — Z888 Allergy status to other drugs, medicaments and biological substances status: Secondary | ICD-10-CM

## 2022-01-17 DIAGNOSIS — I509 Heart failure, unspecified: Secondary | ICD-10-CM | POA: Diagnosis not present

## 2022-01-17 DIAGNOSIS — R778 Other specified abnormalities of plasma proteins: Secondary | ICD-10-CM | POA: Diagnosis present

## 2022-01-17 DIAGNOSIS — J449 Chronic obstructive pulmonary disease, unspecified: Secondary | ICD-10-CM | POA: Diagnosis not present

## 2022-01-17 DIAGNOSIS — Z7985 Long-term (current) use of injectable non-insulin antidiabetic drugs: Secondary | ICD-10-CM

## 2022-01-17 DIAGNOSIS — R739 Hyperglycemia, unspecified: Principal | ICD-10-CM

## 2022-01-17 DIAGNOSIS — E785 Hyperlipidemia, unspecified: Secondary | ICD-10-CM

## 2022-01-17 DIAGNOSIS — Z79899 Other long term (current) drug therapy: Secondary | ICD-10-CM

## 2022-01-17 DIAGNOSIS — E669 Obesity, unspecified: Secondary | ICD-10-CM | POA: Diagnosis present

## 2022-01-17 DIAGNOSIS — R7989 Other specified abnormal findings of blood chemistry: Secondary | ICD-10-CM | POA: Diagnosis present

## 2022-01-17 DIAGNOSIS — Z794 Long term (current) use of insulin: Secondary | ICD-10-CM | POA: Diagnosis not present

## 2022-01-17 DIAGNOSIS — G4733 Obstructive sleep apnea (adult) (pediatric): Secondary | ICD-10-CM

## 2022-01-17 DIAGNOSIS — Z9989 Dependence on other enabling machines and devices: Secondary | ICD-10-CM | POA: Diagnosis not present

## 2022-01-17 DIAGNOSIS — R42 Dizziness and giddiness: Secondary | ICD-10-CM | POA: Diagnosis not present

## 2022-01-17 DIAGNOSIS — E1169 Type 2 diabetes mellitus with other specified complication: Secondary | ICD-10-CM

## 2022-01-17 DIAGNOSIS — I248 Other forms of acute ischemic heart disease: Secondary | ICD-10-CM | POA: Diagnosis not present

## 2022-01-17 LAB — CBC
HCT: 50.6 % (ref 39.0–52.0)
Hemoglobin: 17.6 g/dL — ABNORMAL HIGH (ref 13.0–17.0)
MCH: 27.8 pg (ref 26.0–34.0)
MCHC: 34.8 g/dL (ref 30.0–36.0)
MCV: 80.1 fL (ref 80.0–100.0)
Platelets: 193 10*3/uL (ref 150–400)
RBC: 6.32 MIL/uL — ABNORMAL HIGH (ref 4.22–5.81)
RDW: 14.3 % (ref 11.5–15.5)
WBC: 10.6 10*3/uL — ABNORMAL HIGH (ref 4.0–10.5)
nRBC: 0 % (ref 0.0–0.2)

## 2022-01-17 LAB — BASIC METABOLIC PANEL
Anion gap: 16 — ABNORMAL HIGH (ref 5–15)
BUN: 62 mg/dL — ABNORMAL HIGH (ref 6–20)
CO2: 25 mmol/L (ref 22–32)
Calcium: 10 mg/dL (ref 8.9–10.3)
Chloride: 85 mmol/L — ABNORMAL LOW (ref 98–111)
Creatinine, Ser: 2.42 mg/dL — ABNORMAL HIGH (ref 0.61–1.24)
GFR, Estimated: 31 mL/min — ABNORMAL LOW (ref >60)
Glucose, Bld: 534 mg/dL (ref 70–99)
Potassium: 3 mmol/L — ABNORMAL LOW (ref 3.5–5.1)
Sodium: 126 mmol/L — ABNORMAL LOW (ref 135–145)

## 2022-01-17 LAB — TROPONIN I (HIGH SENSITIVITY)
Troponin I (High Sensitivity): 56 ng/L — ABNORMAL HIGH (ref <18)
Troponin I (High Sensitivity): 51 ng/L — ABNORMAL HIGH (ref <18)

## 2022-01-17 MED ORDER — LACTATED RINGERS IV BOLUS: 2000.0000 mL | Freq: Once | INTRAVENOUS | Status: DC

## 2022-01-17 MED ORDER — LACTATED RINGERS IV BOLUS
1000.0000 mL | Freq: Once | INTRAVENOUS | Status: AC
  Administered 2022-01-18: 1000 mL via INTRAVENOUS

## 2022-01-17 MED ORDER — POTASSIUM CHLORIDE CRYS ER 20 MEQ PO TBCR
40.0000 meq | EXTENDED_RELEASE_TABLET | Freq: Once | ORAL | Status: AC
Start: 2022-01-17 — End: 2022-01-17
  Administered 2022-01-17: 40 meq via ORAL
  Filled 2022-01-17: qty 2

## 2022-01-17 MED ORDER — INSULIN ASPART 100 UNIT/ML IJ SOLN
5.0000 [IU] | Freq: Once | INTRAMUSCULAR | Status: AC
Start: 2022-01-18 — End: 2022-01-18
  Administered 2022-01-18: 5 [IU] via SUBCUTANEOUS
  Filled 2022-01-17: qty 1

## 2022-01-17 NOTE — ED Triage Notes (Signed)
Pt reports elevated blood sugar for 3 days.  Pt has dizziness.  Pt also has nausea. No chest pain  no sob.  Pt alert speech clear.

## 2022-01-17 NOTE — ED Provider Notes (Incomplete)
[facility] Provider Note    Event Date/Time   First MD Initiated Contact with Patient 01/17/22 2329     (approximate)   History   Hyperglycemia   HPI {Remember to add pertinent medical, surgical, social, and/or OB history to HPI:1} Gabriel [name]. is a 51 y.o. male  ***       Physical Exam   Triage Vital Signs: ED Triage Vitals  Enc Vitals Group     BP 01/17/22 1949 125/69     Pulse Rate 01/17/22 1949 79     Resp 01/17/22 1949 (!) 22     Temp 01/17/22 1949 98.7 F (37.1 C)     Temp Source 01/17/22 1949 Oral     SpO2 01/17/22 1949 96 %     Weight 01/17/22 1950 106.6 kg (235 lb)     Height 01/17/22 1950 1.829 m (6')     Head Circumference --      Peak Flow --      Pain Score 01/17/22 1950 0     Pain Loc --      Pain Edu? --      Excl. in [place]? --     Most recent vital signs: Vitals:   01/17/22 1949 01/17/22 2223  BP: 125/69 102/68  Pulse: 79 91  Resp: (!) 22   Temp: 98.7 F (37.1 C) 98 F (36.7 C)  SpO2: 96% 94%    {Only need to document appropriate and relevant physical exam:1} General: Awake, no distress. *** CV:  Good peripheral perfusion. *** Resp:  Normal effort. *** Abd:  No distention. *** Other:  ***   ED Results / Procedures / Treatments   Labs (all labs ordered are listed, but only abnormal results are displayed) Labs Reviewed  BASIC METABOLIC PANEL - Abnormal; Notable for the following components:      Result Value   Sodium 126 (*)    Potassium 3.0 (*)    Chloride 85 (*)    Glucose, Bld 534 (*)    BUN 62 (*)    Creatinine, Ser 2.42 (*)    GFR, Estimated 31 (*)    Anion gap 16 (*)    All other components within normal limits  CBC - Abnormal; Notable for the following components:   WBC 10.6 (*)    RBC 6.32 (*)    Hemoglobin 17.6 (*)    All other components within normal limits  TROPONIN I (HIGH SENSITIVITY) - Abnormal; Notable for the following components:   Troponin I (High Sensitivity) 56 (*)     All other components within normal limits  MAGNESIUM  TROPONIN I (HIGH SENSITIVITY)     EKG  ED ECG REPORT I, [name], the attending physician, personally viewed and interpreted this ECG.  Date: 01/17/2022 EKG Time: 19: 53 Rate: 88 Rhythm: Sinus rhythm with frequent PVC QRS Axis: normal Intervals: Incomplete left bundle branch block ST/T Wave abnormalities: Non-specific ST segment / T-wave changes, but no clear evidence of acute ischemia. Narrative Interpretation: no definitive evidence of acute ischemia; does not meet STEMI criteria.    RADIOLOGY I viewed and interpreted the patient's 2 view chest x-ray and there is no evidence of infection or pulmonary edema.  Radiology agrees that there are no acute findings.    PROCEDURES:  Critical Care performed: Yes, see critical care procedure note(s)  .1-3 Lead EKG Interpretation Performed by: [name], MD Authorized by: [name], MD     Interpretation: normal  ECG rate:  85   ECG rate assessment: normal     Rhythm: sinus rhythm     Ectopy: PVCs     Conduction: normal   .Critical Care Performed by: [name], MD Authorized by: [name], MD   Critical care provider statement:    Critical care time (minutes):  30   Critical care time was exclusive of:  Separately billable procedures and treating other patients   Critical care was necessary to treat or prevent imminent or life-threatening deterioration of the following conditions:  Metabolic crisis and dehydration   Critical care was time spent personally by me on the following activities:  Development of treatment plan with patient or surrogate, evaluation of patient's response to treatment, examination of patient, obtaining history from patient or surrogate, ordering and performing treatments and interventions, ordering and review of laboratory studies, ordering and review of radiographic studies, pulse oximetry, re-evaluation of patient's condition  and review of old charts   MEDICATIONS ORDERED IN ED: Medications  lactated ringers bolus 2,000 mL (has no administration in time range)  potassium chloride SA (KLOR-CON M) CR tablet 40 mEq (has no administration in time range)     IMPRESSION / MDM / [place] / ED COURSE  I reviewed the triage vital signs and the nursing notes.                              Differential diagnosis includes, but is not limited to, ***  Patient's presentation is most consistent with {EM COPA:27473}  {If the patient is on the monitor, remove the brackets and asterisks on the sentence below and remember to document it as a Procedure as well. Otherwise delete the sentence below:1} {**The patient is on the cardiac monitor to evaluate for evidence of arrhythmia and/or significant heart rate changes.**} {Remember to include, when applicable, any/all of the following data: independent review of imaging independent review of labs (comment specifically on pertinent positives and negatives) review of specific prior hospitalizations, PCP/specialist notes, etc. discuss meds given and prescribed document any discussion with consultants (including hospitalists) any clinical decision tools you used and why (PECARN, NEXUS, etc.) did you consider admitting the patient? document social determinants of health affecting patient's care (homelessness, inability to follow up in a timely fashion, etc) document any pre-existing conditions increasing risk on current visit (e.g. diabetes and HTN increasing danger of high-risk chest pain/ACS) describes what meds you gave (especially parenteral) and why any other interventions?:1}     FINAL CLINICAL IMPRESSION(S) / ED DIAGNOSES   Final diagnoses:  None     Rx / DC Orders   ED Discharge Orders     None        Note:  This document was prepared using Dragon voice recognition software and may include unintentional dictation errors.

## 2022-01-17 NOTE — ED Notes (Signed)
Pt states he has been having high blood sugar levels since Monday. Pt states he has been compliant with his insulin medication.   Pt c/o of headache and dizziness. Pt denies chest pain and SOB.

## 2022-01-18 ENCOUNTER — Ambulatory Visit: Payer: BC Managed Care – PPO | Admitting: Podiatry

## 2022-01-18 ENCOUNTER — Encounter: Payer: Self-pay | Admitting: Family Medicine

## 2022-01-18 DIAGNOSIS — R778 Other specified abnormalities of plasma proteins: Secondary | ICD-10-CM | POA: Diagnosis present

## 2022-01-18 DIAGNOSIS — E785 Hyperlipidemia, unspecified: Secondary | ICD-10-CM | POA: Diagnosis present

## 2022-01-18 DIAGNOSIS — G4733 Obstructive sleep apnea (adult) (pediatric): Secondary | ICD-10-CM

## 2022-01-18 DIAGNOSIS — I5022 Chronic systolic (congestive) heart failure: Secondary | ICD-10-CM | POA: Diagnosis present

## 2022-01-18 DIAGNOSIS — I428 Other cardiomyopathies: Secondary | ICD-10-CM | POA: Diagnosis present

## 2022-01-18 DIAGNOSIS — E878 Other disorders of electrolyte and fluid balance, not elsewhere classified: Secondary | ICD-10-CM | POA: Diagnosis present

## 2022-01-18 DIAGNOSIS — G8929 Other chronic pain: Secondary | ICD-10-CM | POA: Diagnosis present

## 2022-01-18 DIAGNOSIS — N189 Chronic kidney disease, unspecified: Secondary | ICD-10-CM

## 2022-01-18 DIAGNOSIS — Z8249 Family history of ischemic heart disease and other diseases of the circulatory system: Secondary | ICD-10-CM | POA: Diagnosis not present

## 2022-01-18 DIAGNOSIS — E1165 Type 2 diabetes mellitus with hyperglycemia: Secondary | ICD-10-CM

## 2022-01-18 DIAGNOSIS — Z794 Long term (current) use of insulin: Secondary | ICD-10-CM | POA: Diagnosis not present

## 2022-01-18 DIAGNOSIS — Z9989 Dependence on other enabling machines and devices: Secondary | ICD-10-CM

## 2022-01-18 DIAGNOSIS — E876 Hypokalemia: Secondary | ICD-10-CM | POA: Diagnosis present

## 2022-01-18 DIAGNOSIS — E1122 Type 2 diabetes mellitus with diabetic chronic kidney disease: Secondary | ICD-10-CM | POA: Diagnosis present

## 2022-01-18 DIAGNOSIS — N183 Chronic kidney disease, stage 3 unspecified: Secondary | ICD-10-CM | POA: Diagnosis present

## 2022-01-18 DIAGNOSIS — N179 Acute kidney failure, unspecified: Secondary | ICD-10-CM

## 2022-01-18 DIAGNOSIS — I248 Other forms of acute ischemic heart disease: Secondary | ICD-10-CM

## 2022-01-18 DIAGNOSIS — R0789 Other chest pain: Secondary | ICD-10-CM | POA: Diagnosis present

## 2022-01-18 DIAGNOSIS — I447 Left bundle-branch block, unspecified: Secondary | ICD-10-CM | POA: Diagnosis present

## 2022-01-18 DIAGNOSIS — E871 Hypo-osmolality and hyponatremia: Secondary | ICD-10-CM | POA: Diagnosis present

## 2022-01-18 DIAGNOSIS — Z86711 Personal history of pulmonary embolism: Secondary | ICD-10-CM | POA: Diagnosis not present

## 2022-01-18 DIAGNOSIS — I252 Old myocardial infarction: Secondary | ICD-10-CM | POA: Diagnosis not present

## 2022-01-18 DIAGNOSIS — J449 Chronic obstructive pulmonary disease, unspecified: Secondary | ICD-10-CM | POA: Diagnosis present

## 2022-01-18 DIAGNOSIS — Z833 Family history of diabetes mellitus: Secondary | ICD-10-CM | POA: Diagnosis not present

## 2022-01-18 DIAGNOSIS — I10 Essential (primary) hypertension: Secondary | ICD-10-CM

## 2022-01-18 DIAGNOSIS — I2510 Atherosclerotic heart disease of native coronary artery without angina pectoris: Secondary | ICD-10-CM | POA: Diagnosis present

## 2022-01-18 DIAGNOSIS — I130 Hypertensive heart and chronic kidney disease with heart failure and stage 1 through stage 4 chronic kidney disease, or unspecified chronic kidney disease: Secondary | ICD-10-CM | POA: Diagnosis present

## 2022-01-18 DIAGNOSIS — E669 Obesity, unspecified: Secondary | ICD-10-CM | POA: Diagnosis present

## 2022-01-18 DIAGNOSIS — Z6831 Body mass index (BMI) 31.0-31.9, adult: Secondary | ICD-10-CM | POA: Diagnosis not present

## 2022-01-18 LAB — CBC
HCT: 46.3 % (ref 39.0–52.0)
Hemoglobin: 16.1 g/dL (ref 13.0–17.0)
MCH: 27.8 pg (ref 26.0–34.0)
MCHC: 34.8 g/dL (ref 30.0–36.0)
MCV: 80 fL (ref 80.0–100.0)
Platelets: 155 10*3/uL (ref 150–400)
RBC: 5.79 MIL/uL (ref 4.22–5.81)
RDW: 14.4 % (ref 11.5–15.5)
WBC: 9.8 10*3/uL (ref 4.0–10.5)
nRBC: 0 % (ref 0.0–0.2)

## 2022-01-18 LAB — BASIC METABOLIC PANEL
Anion gap: 11 (ref 5–15)
Anion gap: 14 (ref 5–15)
Anion gap: 9 (ref 5–15)
BUN: 67 mg/dL — ABNORMAL HIGH (ref 6–20)
BUN: 67 mg/dL — ABNORMAL HIGH (ref 6–20)
BUN: 65 mg/dL — ABNORMAL HIGH (ref 6–20)
CO2: 29 mmol/L (ref 22–32)
CO2: 28 mmol/L (ref 22–32)
CO2: 31 mmol/L (ref 22–32)
Calcium: 9.9 mg/dL (ref 8.9–10.3)
Calcium: 9.4 mg/dL (ref 8.9–10.3)
Calcium: 9 mg/dL (ref 8.9–10.3)
Chloride: 90 mmol/L — ABNORMAL LOW (ref 98–111)
Chloride: 91 mmol/L — ABNORMAL LOW (ref 98–111)
Chloride: 94 mmol/L — ABNORMAL LOW (ref 98–111)
Creatinine, Ser: 2.41 mg/dL — ABNORMAL HIGH (ref 0.61–1.24)
Creatinine, Ser: 2.27 mg/dL — ABNORMAL HIGH (ref 0.61–1.24)
Creatinine, Ser: 2.25 mg/dL — ABNORMAL HIGH (ref 0.61–1.24)
GFR, Estimated: 32 mL/min — ABNORMAL LOW (ref >60)
GFR, Estimated: 34 mL/min — ABNORMAL LOW (ref >60)
GFR, Estimated: 34 mL/min — ABNORMAL LOW (ref >60)
Glucose, Bld: 299 mg/dL — ABNORMAL HIGH (ref 70–99)
Glucose, Bld: 222 mg/dL — ABNORMAL HIGH (ref 70–99)
Glucose, Bld: 159 mg/dL — ABNORMAL HIGH (ref 70–99)
Potassium: 2.7 mmol/L — CL (ref 3.5–5.1)
Potassium: 3.1 mmol/L — ABNORMAL LOW (ref 3.5–5.1)
Potassium: 3.8 mmol/L (ref 3.5–5.1)
Sodium: 130 mmol/L — ABNORMAL LOW (ref 135–145)
Sodium: 133 mmol/L — ABNORMAL LOW (ref 135–145)
Sodium: 134 mmol/L — ABNORMAL LOW (ref 135–145)

## 2022-01-18 LAB — URINALYSIS, ROUTINE W REFLEX MICROSCOPIC
Bacteria, UA: NONE SEEN
Bilirubin Urine: NEGATIVE
Glucose, UA: >=500 mg/dL — AB
Hgb urine dipstick: NEGATIVE
Ketones, ur: NEGATIVE mg/dL
Leukocytes,Ua: NEGATIVE
Nitrite: NEGATIVE
Protein, ur: NEGATIVE mg/dL
Specific Gravity, Urine: 1.012 (ref 1.005–1.030)
Squamous Epithelial / HPF: NONE SEEN (ref 0–5)
pH: 5 (ref 5.0–8.0)

## 2022-01-18 LAB — MAGNESIUM: Magnesium: 2.4 mg/dL (ref 1.7–2.4)

## 2022-01-18 LAB — CBG MONITORING, ED
Glucose-Capillary: 152 mg/dL — ABNORMAL HIGH (ref 70–99)
Glucose-Capillary: 197 mg/dL — ABNORMAL HIGH (ref 70–99)
Glucose-Capillary: 215 mg/dL — ABNORMAL HIGH (ref 70–99)
Glucose-Capillary: 282 mg/dL — ABNORMAL HIGH (ref 70–99)
Glucose-Capillary: 383 mg/dL — ABNORMAL HIGH (ref 70–99)

## 2022-01-18 LAB — TROPONIN I (HIGH SENSITIVITY)
Troponin I (High Sensitivity): 52 ng/L — ABNORMAL HIGH (ref <18)
Troponin I (High Sensitivity): 51 ng/L — ABNORMAL HIGH (ref <18)

## 2022-01-18 LAB — BETA-HYDROXYBUTYRIC ACID: Beta-Hydroxybutyric Acid: 0.09 mmol/L (ref 0.05–0.27)

## 2022-01-18 LAB — GLUCOSE, CAPILLARY: Glucose-Capillary: 307 mg/dL — ABNORMAL HIGH (ref 70–99)

## 2022-01-18 MED ORDER — FENOFIBRATE 160 MG PO TABS
160.0000 mg | ORAL_TABLET | Freq: Every day | ORAL | Status: DC
  Administered 2022-01-18 – 2022-01-19 (×2): 160 mg via ORAL
  Filled 2022-01-18 (×2): qty 1

## 2022-01-18 MED ORDER — RIVAROXABAN 20 MG PO TABS
20.0000 mg | ORAL_TABLET | Freq: Every day | ORAL | Status: DC
  Administered 2022-01-18: 20 mg via ORAL
  Filled 2022-01-18: qty 1

## 2022-01-18 MED ORDER — NITROGLYCERIN 0.4 MG SL SUBL: 0.4000 mg | SUBLINGUAL_TABLET | SUBLINGUAL | Status: DC | PRN

## 2022-01-18 MED ORDER — SODIUM CHLORIDE 0.9 % IV SOLN
INTRAVENOUS | Status: DC
Start: 2022-01-18 — End: 2022-01-19

## 2022-01-18 MED ORDER — ROSUVASTATIN CALCIUM 10 MG PO TABS
20.0000 mg | ORAL_TABLET | Freq: Every day | ORAL | Status: DC
  Administered 2022-01-18 – 2022-01-19 (×2): 20 mg via ORAL
  Filled 2022-01-18: qty 2
  Filled 2022-01-18: qty 1

## 2022-01-18 MED ORDER — POTASSIUM CHLORIDE IN NACL 40-0.9 MEQ/L-% IV SOLN
INTRAVENOUS | Status: DC
  Filled 2022-01-18: qty 1000

## 2022-01-18 MED ORDER — DAPAGLIFLOZIN PROPANEDIOL 10 MG PO TABS
10.0000 mg | ORAL_TABLET | Freq: Every day | ORAL | Status: DC
  Administered 2022-01-18 – 2022-01-19 (×2): 10 mg via ORAL
  Filled 2022-01-18 (×2): qty 1

## 2022-01-18 MED ORDER — CARVEDILOL 6.25 MG PO TABS
6.2500 mg | ORAL_TABLET | Freq: Two times a day (BID) | ORAL | Status: DC
  Administered 2022-01-18 – 2022-01-19 (×3): 6.25 mg via ORAL
  Filled 2022-01-18 (×3): qty 1

## 2022-01-18 MED ORDER — INSULIN GLARGINE-YFGN 100 UNIT/ML ~~LOC~~ SOLN
28.0000 [IU] | Freq: Every day | SUBCUTANEOUS | Status: DC
  Administered 2022-01-18: 28 [IU] via SUBCUTANEOUS
  Filled 2022-01-18 (×2): qty 0.28

## 2022-01-18 MED ORDER — SACUBITRIL-VALSARTAN 24-26 MG PO TABS
1.0000 | ORAL_TABLET | Freq: Two times a day (BID) | ORAL | Status: DC
  Administered 2022-01-18 – 2022-01-19 (×3): 1 via ORAL
  Filled 2022-01-18 (×3): qty 1

## 2022-01-18 MED ORDER — ONDANSETRON HCL 4 MG/2ML IJ SOLN
4.0000 mg | INTRAMUSCULAR | Status: AC
  Administered 2022-01-18: 4 mg via INTRAVENOUS
  Filled 2022-01-18: qty 2

## 2022-01-18 MED ORDER — TRAZODONE HCL 50 MG PO TABS: 25.0000 mg | ORAL_TABLET | Freq: Every evening | ORAL | Status: DC | PRN

## 2022-01-18 MED ORDER — INSULIN ASPART 100 UNIT/ML IJ SOLN
0.0000 [IU] | INTRAMUSCULAR | Status: DC
  Administered 2022-01-18: 7 [IU] via SUBCUTANEOUS
  Administered 2022-01-18: 11 [IU] via SUBCUTANEOUS
  Administered 2022-01-18 (×2): 4 [IU] via SUBCUTANEOUS
  Administered 2022-01-18: 15 [IU] via SUBCUTANEOUS
  Filled 2022-01-18 (×5): qty 1

## 2022-01-18 MED ORDER — OXYCODONE HCL 5 MG PO TABS
5.0000 mg | ORAL_TABLET | Freq: Four times a day (QID) | ORAL | Status: DC | PRN
  Administered 2022-01-19: 5 mg via ORAL
  Filled 2022-01-18: qty 1

## 2022-01-18 MED ORDER — MAGNESIUM HYDROXIDE 400 MG/5ML PO SUSP: 30.0000 mL | Freq: Every day | ORAL | Status: DC | PRN

## 2022-01-18 MED ORDER — IPRATROPIUM-ALBUTEROL 20-100 MCG/ACT IN AERS: 1.0000 | INHALATION_SPRAY | Freq: Four times a day (QID) | RESPIRATORY_TRACT | Status: DC | PRN

## 2022-01-18 MED ORDER — MORPHINE SULFATE (PF) 2 MG/ML IV SOLN: 2.0000 mg | INTRAVENOUS | Status: DC | PRN

## 2022-01-18 MED ORDER — POTASSIUM CHLORIDE 10 MEQ/100ML IV SOLN
10.0000 meq | INTRAVENOUS | Status: AC
  Administered 2022-01-18 (×4): 10 meq via INTRAVENOUS
  Filled 2022-01-18: qty 100

## 2022-01-18 MED ORDER — ACETAMINOPHEN 650 MG RE SUPP: 650.0000 mg | Freq: Four times a day (QID) | RECTAL | Status: DC | PRN

## 2022-01-18 MED ORDER — IPRATROPIUM-ALBUTEROL 0.5-2.5 (3) MG/3ML IN SOLN: 3.0000 mL | Freq: Four times a day (QID) | RESPIRATORY_TRACT | Status: DC | PRN

## 2022-01-18 MED ORDER — ONDANSETRON HCL 4 MG PO TABS: 4.0000 mg | ORAL_TABLET | Freq: Four times a day (QID) | ORAL | Status: DC | PRN

## 2022-01-18 MED ORDER — ONDANSETRON HCL 4 MG/2ML IJ SOLN: 4.0000 mg | Freq: Four times a day (QID) | INTRAMUSCULAR | Status: DC | PRN

## 2022-01-18 MED ORDER — EZETIMIBE 10 MG PO TABS
10.0000 mg | ORAL_TABLET | Freq: Every day | ORAL | Status: DC
  Administered 2022-01-18 – 2022-01-19 (×2): 10 mg via ORAL
  Filled 2022-01-18 (×2): qty 1

## 2022-01-18 MED ORDER — POTASSIUM CHLORIDE CRYS ER 20 MEQ PO TBCR
20.0000 meq | EXTENDED_RELEASE_TABLET | Freq: Every day | ORAL | Status: DC
  Administered 2022-01-18 – 2022-01-19 (×2): 20 meq via ORAL
  Filled 2022-01-18 (×2): qty 1

## 2022-01-18 MED ORDER — SPIRONOLACTONE 25 MG PO TABS
25.0000 mg | ORAL_TABLET | Freq: Every day | ORAL | Status: DC
  Administered 2022-01-18 – 2022-01-19 (×2): 25 mg via ORAL
  Filled 2022-01-18 (×2): qty 1

## 2022-01-18 MED ORDER — ACETAMINOPHEN 325 MG PO TABS
650.0000 mg | ORAL_TABLET | Freq: Four times a day (QID) | ORAL | Status: DC | PRN
  Administered 2022-01-18: 650 mg via ORAL
  Filled 2022-01-18: qty 2

## 2022-01-18 MED ORDER — MORPHINE SULFATE (PF) 4 MG/ML IV SOLN
4.0000 mg | Freq: Once | INTRAVENOUS | Status: AC
  Administered 2022-01-18: 4 mg via INTRAVENOUS
  Filled 2022-01-18: qty 1

## 2022-01-18 NOTE — Assessment & Plan Note (Addendum)
-   The patient be admitted to a medical telemetry bed. - We will place him on supplemental coverage with resistant NovoLog protocol with frequent fingerstick blood glucose measures. - He will be hydrated with IV normal saline. - We will follow BMPs. - We will continue basal coverage.

## 2022-01-18 NOTE — Assessment & Plan Note (Signed)
-  Continue Coreg 

## 2022-01-18 NOTE — H&P (Addendum)
[facility]   PATIENT NAME: Gabriel [name]    MR#:  [id number]  DATE OF BIRTH:  August 22, 1970  DATE OF ADMISSION:  01/17/2022  PRIMARY CARE PHYSICIAN: [name], FNP   Patient is coming from: Home  REQUESTING/REFERRING PHYSICIAN: [name], MD  CHIEF COMPLAINT:   Chief Complaint  Patient presents with   Hyperglycemia    HISTORY OF PRESENT ILLNESS:  Gabriel Yohn. is a 51 y.o. obese Caucasian male with medical history significant for multiple medical problems as mentioned below, who presented to the emergency room with acute onset of nausea without vomiting with associated polydipsia and polyuria.  Blood glucose measures have been elevated up to 460.  He admits to mild cough and sore throat without dyspnea or wheezing.  No fever or chills.  No dysuria, oliguria or flank pain.  No chest pain or palpitations.  ED Course: When he came to the ER respiratory rate was 22 with otherwise normal vital signs.  Labs revealed hypokalemia of 3 with hyponatremia and hypochloremia and blood glucose level of 534 with a BUN of 62 and creatinine 2.42 anion gap of 16 and CO2 of 25.  Magnesium was 2.4.  High-sensitivity troponin was 56 and later 51.  CBC showed WBCs of 10.6 with hemoconcentration.  UA showed more than 500 glucose.    EKG as reviewed by me : EKG showed normal sinus rhythm with a rate of 88 with frequent PVCs and incomplete left bundle branch block with PVCs, prolonged QT interval with QTc of 464 MS. Imaging: 2 view chest x-ray showed no acute cardiopulmonary disease.  The patient was given 4 mg of IV morphine sulfate, 4 mg of IV Zofran, 40 mg p.o. potassium chloride and 1 L bolus of IV lactated Ringer as well as 5 units of IV NovoLog.  He will be admitted to medical telemetry bed for further evaluation and management.  PAST MEDICAL HISTORY:   Past Medical History:  Diagnosis Date   CAD (coronary artery disease)    a. 04/2015 low risk MV;  b. 12/2016 Cath: minor irregs in  LAD/Diag/LCX/OM, RCA 40p/m/d; c. 05/2020 Cath: LM nl, LAD 50d, LCX 30p, OM1 40, RCA 100p w/ L->L collats. CO/CI 3.1/1.3-->Med rx.   Chest wall pain, chronic    Chronic Troponin Elevation    CKD (chronic kidney disease), stage II-III    COPD (chronic obstructive pulmonary disease) (HCC)    Diabetes mellitus without complication ([place])    HFrEF (heart failure with reduced ejection fraction) ([place])    a. 03/2015 Echo: EF 45-50%; b. 12/2015 Echo: EF 20-25%; c. 02/2016 Echo: EF 30-35%; d. 11/2016 Echo: EF 40-45%; e. 06/2019 Echo: EF 30-35%; f. 11/2019 Echo: EF 25-30%; g. 05/2020 Echo: EF 35-40%; h. 05/2021 Echo: EF 40-45%; i. 11/2021 Echo: EF 20-25%, glob HK, mild LVH, GrIII DD, sev red RV fxn, mild MR.   Hypertension    Mitral regurgitation    Mild to moderate by October 2021 echocardiogram.   Mixed Ischemic & NICM (nonischemic cardiomyopathy) ([place])    a. 03/2015 Echo: EF 45-50%; b. 12/2015 Echo: EF 20-25%; c. 02/2016 Echo: EF 30-35%; d. 11/2016 Echo: EF 40-45%; e. 06/2019 Echo: EF 30-35%; f. 11/2019 Echo: EF 25-30%; g. 05/2020 Echo: EF 35-40%; h. 05/2021 Echo: EF 40-45%; i. 11/2021 Echo: EF 20-25%   Myocardial infarct (HCC)    NSVT (nonsustained ventricular tachycardia) ([place])    a. 12/2015 noted on tele-->amio;  b. 12/2015 Event monitor: no VT noted.  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  12/31/19: RUL and RLL PEs.   Syncope    a. 01/2016 - felt to be vasovagal.    PAST SURGICAL HISTORY:   Past Surgical History:  Procedure Laterality Date   AMPUTATION     CARDIAC CATHETERIZATION     FINGER AMPUTATION     Traumatic   FINGER FRACTURE SURGERY Left    LEFT HEART CATH AND CORONARY ANGIOGRAPHY N/A 01/06/2017   Procedure: Left Heart Cath and Coronary Angiography;  Surgeon: Wellington Hampshire, MD;  Location: [facility];  Service: Cardiovascular;  Laterality: N/A;   RIGHT/LEFT HEART CATH AND CORONARY ANGIOGRAPHY N/A  06/13/2020   Procedure: RIGHT/LEFT HEART CATH AND CORONARY ANGIOGRAPHY;  Surgeon: Nelva Bush, MD;  Location: [facility];  Service: Cardiovascular;  Laterality: N/A;    SOCIAL HISTORY:   Social History   Tobacco Use   Smoking status: Former    Packs/day: 0.50    Years: 33.00    Pack years: 16.50    Types: Cigarettes    Quit date: 05/08/2020    Years since quitting: 1.6   Smokeless tobacco: Former    Quit date: 05/08/2020   Tobacco comments:    Quit Sept 2021  Substance Use Topics   Alcohol use: Not Currently    Comment: occassionally    FAMILY HISTORY:   Family History  Problem Relation Age of Onset   Diabetes Mother    Diabetes Mellitus II Mother    Hypothyroidism Mother    Hypertension Mother    Kidney failure Mother        Dialysis   Heart attack Mother        54 yo approximately   Hypertension Father    Gout Father    Cancer Maternal Grandfather    Diabetes Maternal Grandfather    Cancer Paternal Aunt     DRUG ALLERGIES:   Allergies  Allergen Reactions   Metformin And Related Nausea And Vomiting   Prednisone Other (See Comments)    Reaction: Hallucinations     Zantac [Ranitidine Hcl] Diarrhea and Nausea Only    Night sweats    REVIEW OF SYSTEMS:   ROS As per history of present illness. All pertinent systems were reviewed above. Constitutional, HEENT, cardiovascular, respiratory, GI, GU, musculoskeletal, neuro, psychiatric, endocrine, integumentary and hematologic systems were reviewed and are otherwise negative/unremarkable except for positive findings mentioned above in the HPI.   MEDICATIONS AT HOME:   Prior to Admission medications   Medication Sig Start Date End Date Taking? Authorizing Provider  carvedilol (COREG) 6.25 MG tablet Take 1 tablet (6.25 mg total) by mouth 2 (two) times daily with a meal. 01/15/22  Yes [name] A, FNP  dapagliflozin propanediol (FARXIGA) 10 MG TABS tablet Take 1 tablet (10 mg total) by mouth  daily. 01/15/22  Yes Hackney, Tina A, FNP  Dulaglutide (TRULICITY) 1.5 WU/9.8JX SOPN Inject 1.5 mg into the skin once a week. 10/08/21  Yes Tally Joe T, FNP  ezetimibe (ZETIA) 10 MG tablet Take 1 tablet (10 mg total) by mouth daily. 01/15/22  Yes Hackney, Tina A, FNP  fenofibrate (TRICOR) 145 MG tablet Take 1 tablet (145 mg total) by mouth daily. 01/15/22  Yes Hackney, Otila Kluver A, FNP  Insulin Glargine (BASAGLAR KWIKPEN) 100 UNIT/ML Inject 28 Units into the skin at bedtime. 09/05/21 09/26/22 Yes [name], FNP  insulin lispro (HUMALOG)  100 UNIT/ML KwikPen Inject 3 Units into the skin 3 (three) times daily. 08/13/21  Yes [name], MD  Ipratropium-Albuterol (COMBIVENT RESPIMAT) 20-100 MCG/ACT AERS respimat Inhale 1 puff into the lungs every 6 (six) hours as needed for wheezing.   Yes [provider]  potassium chloride SA (KLOR-CON M20) 20 MEQ tablet Take 1 tablet daily 01/15/22  Yes Hackney, Aura Fey, FNP  rivaroxaban (XARELTO) 20 MG TABS tablet Take 1 tablet (20 mg total) by mouth daily with supper. 01/15/22  Yes [name] A, FNP  rosuvastatin (CRESTOR) 20 MG tablet Take 1 tablet (20 mg total) by mouth daily. 01/15/22  Yes Hackney, Tina A, FNP  sacubitril-valsartan (ENTRESTO) 24-26 MG Take 1 tablet by mouth 2 (two) times daily. 12/20/21  Yes [name] A, FNP  spironolactone (ALDACTONE) 25 MG tablet Take 1 tablet (25 mg total) by mouth daily. 01/15/22  Yes Hackney, Tina A, FNP  Torsemide 40 MG TABS Take 40 mg by mouth 2 (two) times daily. And additionally '80mg'$  as needed for weight gain 01/15/22  Yes [name] A, FNP  Continuous Blood Gluc Receiver (FREESTYLE LIBRE 2 READER) DEVI Use to monitor blood sugars as directed 10/02/21   Tally Joe T, FNP  Insulin Pen Needle (PEN NEEDLES) 32G X 4 MM MISC 200 each by Does not apply route at bedtime. 09/05/21   [name], FNP  nitroGLYCERIN (NITROSTAT) 0.4 MG SL tablet Place 1 tablet (0.4 mg total) under the tongue every 5 (five) minutes x 3 doses  as needed for chest pain. 06/09/20   Lorella Nimrod, MD  sildenafil (VIAGRA) 100 MG tablet Take 1 tablets 1 hour prior to intercourse. 11/19/21   Stoioff, Ronda Fairly, MD      VITAL SIGNS:  Blood pressure (!) 103/58, pulse 77, temperature 98 F (36.7 C), temperature source Oral, resp. rate 20, height 6' (1.829 m), weight 106.6 kg, SpO2 92 %.  PHYSICAL EXAMINATION:  Physical Exam  GENERAL:  51 y.o.-year-old Caucasian male  patient lying in the bed with no acute distress.  EYES: Pupils equal, round, reactive to light and accommodation. No scleral icterus. Extraocular muscles intact.  HEENT: Head atraumatic, normocephalic. Oropharynx and nasopharynx clear.  NECK:  Supple, no jugular venous distention. No thyroid enlargement, no tenderness.  LUNGS: Normal breath sounds bilaterally, no wheezing, rales,rhonchi or crepitation. No use of accessory muscles of respiration.  CARDIOVASCULAR: Regular rate and rhythm, S1, S2 normal. No murmurs, rubs, or gallops.  ABDOMEN: Soft, nondistended, nontender. Bowel sounds present. No organomegaly or mass.  EXTREMITIES: No pedal edema, cyanosis, or clubbing.  NEUROLOGIC: Cranial nerves II through XII are intact. Muscle strength 5/5 in all extremities. Sensation intact. Gait not checked.  PSYCHIATRIC: The patient is alert and oriented x 3.  Normal affect and good eye contact. SKIN: No obvious rash, lesion, or ulcer.   LABORATORY PANEL:   CBC Recent Labs  Lab 01/18/22 0559  WBC 9.8  HGB 16.1  HCT 46.3  PLT 155   ------------------------------------------------------------------------------------------------------------------  Chemistries  Recent Labs  Lab 01/17/22 2231 01/18/22 0117 01/18/22 0559  NA  --    < > 133*  K  --    < > 3.1*  CL  --    < > 91*  CO2  --    < > 28  GLUCOSE  --    < > 222*  BUN  --    < > 67*  CREATININE  --    < > 2.27*  CALCIUM  --    < >  9.4  MG 2.4  --   --    < > = values in this interval not displayed.    ------------------------------------------------------------------------------------------------------------------  Cardiac Enzymes No results for input(s): TROPONINI in the last 168 hours. ------------------------------------------------------------------------------------------------------------------  RADIOLOGY:  DG Chest 2 View  Result Date: 01/17/2022 CLINICAL DATA:  Dizziness EXAM: CHEST - 2 VIEW COMPARISON:  12/04/2021 FINDINGS: Heart and mediastinal contours are within normal limits. No focal opacities or effusions. No acute bony abnormality. IMPRESSION: No active cardiopulmonary disease. Electronically Signed   By: Rolm Baptise M.D.   On: 01/17/2022 20:13      IMPRESSION AND PLAN:  Assessment and Plan: * Uncontrolled type 2 diabetes mellitus with hyperglycemia, with long-term current use of insulin ([place]) - The patient be admitted to a medical telemetry bed. - We will place him on supplemental coverage with resistant NovoLog protocol with frequent fingerstick blood glucose measures. - He will be hydrated with IV normal saline. - We will follow BMPs. - We will continue basal coverage.  Acute kidney injury superimposed on chronic kidney disease ([place]) - We will continue hydration with IV normal saline. - We will follow BMP. - We will avoid nephrotoxins.  Elevated troponin - We will follow serial troponins. - He has no current chest pain. - This could be related to mild demand ischemia.  Dyslipidemia - We will continue statin therapy.  OSA on CPAP - We will continue CPAP nightly.  Essential hypertension Continue Coreg.    DVT prophylaxis: Xarelto. Advanced Care Planning:  Code Status: full code. Family Communication:  The plan of care was discussed in details with the patient (and family). I answered all questions. The patient agreed to proceed with the above mentioned plan. Further management will depend upon hospital course. Disposition Plan: Back to previous  home environment Consults called: none. All the records are reviewed and case discussed with ED provider.  Status is: Inpatient  At the time of the admission, it appears that the appropriate admission status for this patient is inpatient.  This is judged to be reasonable and necessary in order to provide the required intensity of service to ensure the patient's safety given the presenting symptoms, physical exam findings and initial radiographic and laboratory data in the context of comorbid conditions.  The patient requires inpatient status due to high intensity of service, high risk of further deterioration and high frequency of surveillance required.  I certify that at the time of admission, it is my clinical judgment that the patient will require inpatient hospital care extending more than 2 midnights.                            Dispo: The patient is from: Home              Anticipated d/c is to: Home              Patient currently is not medically stable to d/c.              Difficult to place patient: No  Christel Mormon M.D on 01/18/2022 at 7:44 AM  [facility]   From 7 PM-7 AM, contact night-coverage [email or web address]  CC: Primary care physician; [name], FNP

## 2022-01-18 NOTE — Assessment & Plan Note (Addendum)
-   We will follow serial troponins. - He has no current chest pain. - This could be related to mild demand ischemia.

## 2022-01-18 NOTE — ED Notes (Signed)
Assumed care of patient. Placed in hospital bed. AOX4. Resp even, unlabored on RA. Reports head pain 10/10. Morphine and zofran administered as ordered. Sinus rhythm with PVC's noted on monitor at 80's. No distress noted at this time.

## 2022-01-18 NOTE — Progress Notes (Signed)
PT assessed for CPAP use, and stated that will absolutely not wear it while here and does not wear his own at home. REFUSED. NARD/SOB noted while on room air.

## 2022-01-18 NOTE — Assessment & Plan Note (Signed)
We will continue CPAP nightly. 

## 2022-01-18 NOTE — Progress Notes (Signed)
Inpatient Diabetes Program Recommendations  AACE/ADA: New Consensus Statement on Inpatient Glycemic Control (2015)  Target Ranges:  Prepandial:   less than 140 mg/dL      Peak postprandial:   less than 180 mg/dL (1-2 hours)      Critically ill patients:  140 - 180 mg/dL   Lab Results  Component Value Date   GLUCAP 197 (H) 01/18/2022   HGBA1C 8.7 (H) 11/24/2021    Review of Glycemic Control  Latest Reference Range & Units 01/18/22 10:17  Glucose-Capillary 70 - 99 mg/dL 197 (H)   Diabetes history: DM 2 Outpatient Diabetes medications:  Farxiga 10 mg daily, Trulicity 1.5 weekly, Basaglar 28 units q HS, Humalog 3 units tid with meals Current orders for Inpatient glycemic control:  Semglee 28 units q HS Novolog resistant q 4 hours Inpatient Diabetes Program Recommendations:    Agree with orders. Will follow.  Thanks, Adah Perl, RN, BC-ADM Inpatient Diabetes Coordinator Pager [phone]  (8a-5p)

## 2022-01-18 NOTE — Assessment & Plan Note (Signed)
-   We will continue statin therapy. 

## 2022-01-18 NOTE — Consult Note (Signed)
Cardiology Consult    Patient ID: Gabriel [name]. MRN: [id number], DOB/AGE: 1970-11-10   Admit date: 01/17/2022 Date of Consult: 01/18/2022  Primary Physician: [name], FNP Primary Cardiologist: [name], MD Requesting Provider: Domenica Reamer, DO  Patient Profile    51 year old male with a history of CAD, chronic chest pain, chronic troponin elevation, chronic heart failure with reduced ejection fraction, mixed ischemic and nonischemic cardiomyopathy, nonsustained VT, stage III chronic kidney disease, COPD, chronic anticoagulation secondary to unprovoked PE, type 2 diabetes mellitus, and obesity, who is being seen today for the evaluation of chest pain and chronically elevated troponin at the request of Dr. Benny Lennert.  Past Medical History   Past Medical History:  Diagnosis Date   CAD (coronary artery disease)    a. 04/2015 low risk MV;  b. 12/2016 Cath: minor irregs in LAD/Diag/LCX/OM, RCA 40p/m/d; c. 05/2020 Cath: LM nl, LAD 50d, LCX 30p, OM1 40, RCA 100p w/ L->L collats. CO/CI 3.1/1.3-->Med rx.   Chest wall pain, chronic    Chronic Troponin Elevation    CKD (chronic kidney disease), stage II-III    COPD (chronic obstructive pulmonary disease) (HCC)    Diabetes mellitus without complication ([place])    HFrEF (heart failure with reduced ejection fraction) ([place])    a. 03/2015 Echo: EF 45-50%; b. 12/2015 Echo: EF 20-25%; c. 02/2016 Echo: EF 30-35%; d. 11/2016 Echo: EF 40-45%; e. 06/2019 Echo: EF 30-35%; f. 11/2019 Echo: EF 25-30%; g. 05/2020 Echo: EF 35-40%; h. 05/2021 Echo: EF 40-45%; i. 11/2021 Echo: EF 20-25%, glob HK, mild LVH, GrIII DD, sev red RV fxn, mild MR.   Hypertension    Mitral regurgitation    Mild to moderate by October 2021 echocardiogram.   Mixed Ischemic & NICM (nonischemic cardiomyopathy) ([place])    a. 03/2015 Echo: EF 45-50%; b. 12/2015 Echo: EF 20-25%; c. 02/2016 Echo: EF 30-35%; d. 11/2016 Echo: EF 40-45%; e. 06/2019 Echo: EF 30-35%; f. 11/2019 Echo: EF 25-30%; g. 05/2020  Echo: EF 35-40%; h. 05/2021 Echo: EF 40-45%; i. 11/2021 Echo: EF 20-25%   Myocardial infarct (HCC)    NSVT (nonsustained ventricular tachycardia) ([place])    a. 12/2015 noted on tele-->amio;  b. 12/2015 Event monitor: no VT noted.   Obesity (BMI 30.0-34.9)    Psoriasis    Recurrent pulmonary emboli ([place]) 06/07/2020   06/07/20: small bilateral PEs.  12/31/19: RUL and RLL PEs.   Syncope    a. 01/2016 - felt to be vasovagal.    Past Surgical History:  Procedure Laterality Date   AMPUTATION     CARDIAC CATHETERIZATION     FINGER AMPUTATION     Traumatic   FINGER FRACTURE SURGERY Left    LEFT HEART CATH AND CORONARY ANGIOGRAPHY N/A 01/06/2017   Procedure: Left Heart Cath and Coronary Angiography;  Surgeon: Wellington Hampshire, MD;  Location: [facility];  Service: Cardiovascular;  Laterality: N/A;   RIGHT/LEFT HEART CATH AND CORONARY ANGIOGRAPHY N/A 06/13/2020   Procedure: RIGHT/LEFT HEART CATH AND CORONARY ANGIOGRAPHY;  Surgeon: Nelva Bush, MD;  Location: [facility];  Service: Cardiovascular;  Laterality: N/A;     Allergies  Allergies  Allergen Reactions   Metformin And Related Nausea And Vomiting   Prednisone Other (See Comments)    Reaction: Hallucinations     Zantac [Ranitidine Hcl] Diarrhea and Nausea Only    Night sweats    History of Present Illness    51 y/o ? w/ a history including CAD, chronic chest  pain, chronic troponin elevation, HFrEF, mixed ischemic and nonischemic cardiomyopathy, nonsustained VT, stage III chronic kidney disease, COPD, unprovoked PE on chronic anticoagulation, type 2 diabetes mellitus, and obesity.  Over the years, he has had multiple hospitalization for management of chronic HFrEF, chest pain, and elevated troponins.  Prior stress testing in 2016 was nonischemic.  In October 2021, he was admitted with chest pain and elevated troponin above her baseline.  Diagnostic catheterization revealed a chronic total occlusion of the right coronary  artery with left-to-right collaterals.  He otherwise had moderate, nonobstructive disease including a 50% distal stenosis in the LAD, 30% proximal stenosis in the left circumflex, and a 40% OM1 lesion.  He was medically managed.  Follow-up echo in October 2022, in the setting of recurrent hospitalization for heart failure, showed EF 40 to 45%.  He was readmitted again in February 2023 secondary to heart failure, and most recently in early April.  He responded well to IV diuresis and at his request, he was discharged home on April 12 torsemide 40 mg twice daily (previous outpatient dose of 60 mg daily).  Creatinine was trending up prior to discharge and follow-up creatinine today later was higher at 2.08.  We asked the patient to reduce his torsemide back to 60 mg once a day.  Unfortunately, he had weight gain and worsening dyspnea and fatigue over the weekend and was seen in the emergency department on April 17.  He was treated with intravenous Lasix and subsequently discharged.  He f/u in clnic on 4/20, at which time he was euvolemic.  He was last seen in cardiology clinic on 5/30, at which time he reported chronic c/p, which was unchanged.  He was felt to have mild volume overload and torsemide was inc to 80 BID x 3 days w/ plan to drop back down to 40 BID thereafter.  He notes that after increasing torsemide for a few days, wt stabilized and he has otw been feeling well from a cardiac standpoint.  Beginning 5/31, he started feeling nauseated and fatigued and noted markedly elevated blood glucoses despite compliance w/ all home meds.  Due to ongoing hyperglycemia/nausea/fatigue, he presented to the ED 6/1 with blood glucose elevated @ 534.  Subsequent labs found troponins elevated from 56  51  52  51.  This is consistent with his chronically elevated troponins since 06/2019.  Other notable labs: K 2.7, Creat up from baseline @ 2.42.  He was treated w/ insulin, IVF, and KCl.  He is currently c/o a 10/10 headache  that has been going on for a week along with a mild sore throat, though he is in no distress @ this time.  He also c/o 8/10 left chest discomfort that he says is similar to his "usual chest pain" but more severe.  This pain, as usual, is reproducible w/ palpation.    Inpatient Medications     carvedilol  6.25 mg Oral BID WC   dapagliflozin propanediol  10 mg Oral Daily   ezetimibe  10 mg Oral Daily   fenofibrate  160 mg Oral Daily   insulin aspart  0-20 Units Subcutaneous Q4H   insulin glargine-yfgn  28 Units Subcutaneous Q2200   potassium chloride SA  20 mEq Oral Daily   rivaroxaban  20 mg Oral Q supper   rosuvastatin  20 mg Oral Daily   sacubitril-valsartan  1 tablet Oral BID   spironolactone  25 mg Oral Daily    Family History    Family History  Problem Relation Age of Onset   Diabetes Mother    Diabetes Mellitus II Mother    Hypothyroidism Mother    Hypertension Mother    Kidney failure Mother        Dialysis   Heart attack Mother        40 yo approximately   Hypertension Father    Gout Father    Cancer Maternal Grandfather    Diabetes Maternal Grandfather    Cancer Paternal Aunt    He indicated that his mother is deceased. He indicated that his father is alive. He indicated that his maternal grandfather is deceased. He indicated that his paternal aunt is deceased.   Social History    Social History   Socioeconomic History   Marital status: Widowed    Spouse name: Not on file   Number of children: 1   Years of education: 63   Highest education level: 12th grade  Occupational History   Occupation: disabled  Tobacco Use   Smoking status: Former    Packs/day: 0.50    Years: 33.00    Pack years: 16.50    Types: Cigarettes    Quit date: 05/08/2020    Years since quitting: 1.6   Smokeless tobacco: Former    Quit date: 05/08/2020   Tobacco comments:    Quit Sept 2021  Vaping Use   Vaping Use: Former  Substance and Sexual Activity   Alcohol use: Not  Currently    Comment: occassionally   Drug use: No   Sexual activity: Not Currently  Other Topics Concern   Not on file  Social History Narrative   Lives at home with wife and his dad's wife.        Works sales/advanced Academic librarian; smoker; cutting; hx of alcoholism [started 66 year]; quit at age of 29 years.    Social Determinants of Health   Financial Resource Strain: Not on file  Food Insecurity: Not on file  Transportation Needs: Not on file  Physical Activity: Not on file  Stress: Not on file  Social Connections: Not on file  Intimate Partner Violence: Not on file     Review of Systems    General:  No chills, fever, night sweats or weight changes.  Cardiovascular:  +++ 8/10 chest tightness - worse w/ palpation, +++ dyspnea on exertion, no edema, orthopnea, palpitations, paroxysmal nocturnal dyspnea. Dermatological: No rash, lesions/masses Respiratory: No cough, +++ dyspnea on exertion Urologic: No hematuria, dysuria Abdominal:   +++ Positive for nausea, no vomiting, diarrhea, bright red blood per rectum, melena, or hematemesis Neurologic:  No visual changes, wkns, changes in mental status.  +++ headache HEENT: +++ sore throat All other systems reviewed and are otherwise negative except as noted above.  Physical Exam    Blood pressure 98/62, pulse 65, temperature 98.9 F (37.2 C), temperature source Oral, resp. rate 17, height 6' (1.829 m), weight 106.6 kg, SpO2 93 %.  General: Pleasant, NAD Psych: Normal affect. Neuro: Alert and oriented X 3. Moves all extremities spontaneously. HEENT:  Sore throat, otherwise normal  Neck: Supple without bruits or JVD. Lungs:  Resp regular and unlabored, CTA. Heart: RRR no s3, s4, or murmurs. Abdomen: Obese, protuberant, non-tender, BS + x 4.  Extremities: No clubbing, cyanosis or edema. DP/PT2+, Radials 2+ and equal bilaterally.  Labs    Cardiac Enzymes Recent Labs  Lab 01/17/22 1952 01/17/22 2231 01/18/22 0800 01/18/22 1308   TROPONINIHS 56* 51* 52* 51*     BNP  Component Value Date/Time   BNP 243.0 (H) 12/04/2021 0919    Lab Results  Component Value Date   WBC 9.8 01/18/2022   HGB 16.1 01/18/2022   HCT 46.3 01/18/2022   MCV 80.0 01/18/2022   PLT 155 01/18/2022    Recent Labs  Lab 01/18/22 1308  NA 134*  K 3.8  CL 94*  CO2 31  BUN 65*  CREATININE 2.25*  CALCIUM 9.0  GLUCOSE 159*   Lab Results  Component Value Date   CHOL 110 11/25/2021   HDL 17 (L) 11/25/2021   LDLCALC 62 11/25/2021   TRIG 153 (H) 11/25/2021   Lab Results  Component Value Date   DDIMER 0.40 03/01/2021    Radiology Studies    DG Chest 2 View  Result Date: 01/17/2022 CLINICAL DATA:  Dizziness EXAM: CHEST - 2 VIEW COMPARISON:  12/04/2021 FINDINGS: Heart and mediastinal contours are within normal limits. No focal opacities or effusions. No acute bony abnormality. IMPRESSION: No active cardiopulmonary disease. Electronically Signed   By: Rolm Baptise M.D.   On: 01/17/2022 20:13    ECG & Cardiac Imaging    NSR, 88, PVCs, mild inflat ST depression, prolonged QT- personally reviewed.  Assessment & Plan   1.  CAD/Elevated Troponin: H/o CAD w/ last cath in 10/201 revealing an occluded RCA w/ otw nonobs dzs and L  R collats. He has been medically managed since and has chronic chest/chest wall pain as well as chronic, mild HsTrop elevations w/ trends in the 50's dating back to 06/2019.  He presented 6/1 w/ nausea/fatigue/hyperglycemia and in that setting, HsTrops up.  His chest wall pain and tenderness is sl worse than baseline (chronic 1-2/10), but is easily reproducible on exam w/ light palpation over the left chest.  ECG w/o acute findings.  Cont med rx w/ ? blocker and statin rx.  No asa in setting of chronic xarelto. No plan for ischemic eval @ this time.  2.  Chronic HFrEF/Mixed Ischemic and nonischemic cardiomyopathy:  Recently seen on 01/15/22 by Cardiology in the office.  He is on Entresto, Farxiga, spironolactone, and  Coreg.  Weight reported today at 235lbs, weight in office was 238lbs. Echo from 4/23 showed EF 20-25% which was down from 10/22 where EF was 40-45%.  Today he is euvolemic but receiving IV hydration for hyperglycemia.  Agree w/ holding torsemide for the time being in setting of AKI/hyperglycemia.  Will need to watch volume closely.  Cont ? blocker, spiro, entresto (consider holding for worsening renal fxn).  Keep plan for outpt f/u and outpt echo to re-eval EF and determine need for EP eval.  3.  Essential hypertension: stable, BP 102/58 in ED, on current medication therapy.  May need to hold entresto/spiro.  Follow.  4.  Hyperlipidemia: stable, LDL 62 in April, currently on statin therapy  5.  H/o PE/Chronic anticoagulation: On Xarelto.  Follow creat, may need to adjust dose if renal fxn doesn't improve.  6.  CKD III/AKI: at baseline around 1.4, today it is 2.25.  Follow w/ hydration and gluc correction.  7.  Hyperglycemia/Type 2 Diabetes Mellitus: hyperglycemic on admission, management per IM.  Signed, Murray Hodgkins, NP 01/18/2022, 4:08 PM  For questions or updates, please contact   Please consult [email or web address] for contact info under Cardiology/STEMI.

## 2022-01-18 NOTE — ED Notes (Signed)
Report received from [facility]. Patient care assumed. Patient/RN introduction complete. Will continue to monitor.

## 2022-01-18 NOTE — ED Notes (Signed)
Informed RN bed assigned 

## 2022-01-18 NOTE — Assessment & Plan Note (Signed)
-   We will continue hydration with IV normal saline. - We will follow BMP. - We will avoid nephrotoxins.

## 2022-01-18 NOTE — ED Notes (Signed)
Report to [facility] pt awaiting transport to [phone]

## 2022-01-18 NOTE — Progress Notes (Signed)
The patient is a 51 yr old man who presented to [facility] ED on 01/17/2022 with complaints of nausea and vomiting, polydipsia, polyuria. He has also had a mild cough and sore throat without dyspnea or wheeze. His blood sugars had been running in the 400's.   In the ED he was found to have normal vitals except for RR of 22. Potassium was 3 and Sodium was minimally reduced at 133. Blood glucose was elevated at 534. Creatinine was 2.42. He had an anion gap of 16 and a CO2 of 25. His magnesium was 2.5. Troponins were elevated at 56 and 51. EKG demonstrated downgoing lateral T's that were chronic. CXR was unremarkable.  The patient carries a past medical history significant for CAD, Chronic troponin elevation, CKD, COPD, CM , HFREF, HTN, Mixed ischemic and non-ischemic CMO, NSVT, obesity, and recurrent pulmonary emboli.  He was admitted to a telemetry bed by my colleague, Dr. Sidney Ace early this am.  Vitals and labwork have been reviewed as well as CXR and EKG's.  The patient received IV fluids and insulin. He is monitored on telemetry. Cardiology was consulted. It was felt that his chest pain and troponins were chronic. No intervention or further investigation was required.   The patient is sitting up on the gurney complaining of severe fatigue and chest pain.   His BMP will be closely monitored. His lantus and insulin will be adjusted as necessary. Nephrotoxins will be avoided. Creatinine and electrolytes will be monitored.

## 2022-01-19 LAB — CBC WITH DIFFERENTIAL/PLATELET
Abs Immature Granulocytes: 0.05 10*3/uL (ref 0.00–0.07)
Basophils Absolute: 0.1 10*3/uL (ref 0.0–0.1)
Basophils Relative: 1 %
Eosinophils Absolute: 0.2 10*3/uL (ref 0.0–0.5)
Eosinophils Relative: 2 %
HCT: 45.3 % (ref 39.0–52.0)
Hemoglobin: 15.1 g/dL (ref 13.0–17.0)
Immature Granulocytes: 1 %
Lymphocytes Relative: 36 %
Lymphs Abs: 2.8 10*3/uL (ref 0.7–4.0)
MCH: 27.4 pg (ref 26.0–34.0)
MCHC: 33.3 g/dL (ref 30.0–36.0)
MCV: 82.1 fL (ref 80.0–100.0)
Monocytes Absolute: 0.7 10*3/uL (ref 0.1–1.0)
Monocytes Relative: 9 %
Neutro Abs: 4.1 10*3/uL (ref 1.7–7.7)
Neutrophils Relative %: 51 %
Platelets: 132 10*3/uL — ABNORMAL LOW (ref 150–400)
RBC: 5.52 MIL/uL (ref 4.22–5.81)
RDW: 14.1 % (ref 11.5–15.5)
WBC: 7.8 10*3/uL (ref 4.0–10.5)
nRBC: 0 % (ref 0.0–0.2)

## 2022-01-19 LAB — BASIC METABOLIC PANEL
Anion gap: 10 (ref 5–15)
BUN: 61 mg/dL — ABNORMAL HIGH (ref 6–20)
CO2: 29 mmol/L (ref 22–32)
Calcium: 9.6 mg/dL (ref 8.9–10.3)
Chloride: 96 mmol/L — ABNORMAL LOW (ref 98–111)
Creatinine, Ser: 2.06 mg/dL — ABNORMAL HIGH (ref 0.61–1.24)
GFR, Estimated: 38 mL/min — ABNORMAL LOW (ref >60)
Glucose, Bld: 152 mg/dL — ABNORMAL HIGH (ref 70–99)
Potassium: 3.9 mmol/L (ref 3.5–5.1)
Sodium: 135 mmol/L (ref 135–145)

## 2022-01-19 LAB — GLUCOSE, CAPILLARY
Glucose-Capillary: 151 mg/dL — ABNORMAL HIGH (ref 70–99)
Glucose-Capillary: 172 mg/dL — ABNORMAL HIGH (ref 70–99)
Glucose-Capillary: 178 mg/dL — ABNORMAL HIGH (ref 70–99)

## 2022-01-19 MED ORDER — INSULIN ASPART 100 UNIT/ML IJ SOLN
0.0000 [IU] | INTRAMUSCULAR | Status: DC
  Administered 2022-01-19 (×2): 4 [IU] via SUBCUTANEOUS
  Filled 2022-01-19 (×2): qty 1

## 2022-01-19 MED ORDER — BASAGLAR KWIKPEN 100 UNIT/ML ~~LOC~~ SOPN
36.0000 [IU] | PEN_INJECTOR | Freq: Every evening | SUBCUTANEOUS | 3 refills | Status: DC
  Filled 2022-01-19: qty 15, 30d supply, fill #0

## 2022-01-19 MED ORDER — INSULIN LISPRO (1 UNIT DIAL) 100 UNIT/ML (KWIKPEN)
PEN_INJECTOR | SUBCUTANEOUS | 2 refills | Status: DC
  Filled 2022-01-19: qty 15, 90d supply, fill #0

## 2022-01-19 NOTE — Discharge Summary (Signed)
Physician Discharge Summary   Patient: Gabriel [name]. MRN: [id number] DOB: 08/12/1971  Admit date:     01/17/2022  Discharge date: 01/19/22  Discharge Physician: Karie Kirks   PCP: [name], FNP   Recommendations at discharge:    Discharge to home Check glucoses twice daily and record values. Follow up with PCP in 7-10 days. Take in record of glucoses after discharges to visit.  Discharge Diagnoses: Principal Problem:   Uncontrolled type 2 diabetes mellitus with hyperglycemia, with long-term current use of insulin (HCC) Active Problems:   Acute kidney injury superimposed on chronic kidney disease (HCC)   Elevated troponin   Essential hypertension   OSA on CPAP   Dyslipidemia  Hospital Course: The patient is a 51 yr old man who presented to [facility] ED on 01/17/2022 with complaints of nausea and vomiting, polydipsia, polyuria. He has also had a mild cough and sore throat without dyspnea or wheeze. His blood sugars had been running in the 400's.    In the ED he was found to have normal vitals except for RR of 22. Potassium was 3 and Sodium was minimally reduced at 133. Blood glucose was elevated at 534. Creatinine was 2.42. He had an anion gap of 16 and a CO2 of 25. His magnesium was 2.5. Troponins were elevated at 56 and 51. EKG demonstrated downgoing lateral T's that were chronic. CXR was unremarkable.   The patient carries a past medical history significant for CAD, Chronic troponin elevation, CKD, COPD, CM , HFREF, HTN, Mixed ischemic and non-ischemic CMO, NSVT, obesity, and recurrent pulmonary emboli.   He was admitted to a telemetry bed by my colleague, Dr. Sidney Ace early this am.   Vitals and labwork have been reviewed as well as CXR and EKG's.   The patient received IV fluids and insulin. He is monitored on telemetry. Cardiology was consulted. It was felt that his chest pain and troponins were chronic. No intervention or further investigation was required.   The patient's  home insulin has been adjusted. He will be discharged to home in fair condition.  Assessment and Plan: * Uncontrolled type 2 diabetes mellitus with hyperglycemia, with long-term current use of insulin ([place]) - The patient be admitted to a medical telemetry bed. - We will place him on supplemental coverage with resistant NovoLog protocol with frequent fingerstick blood glucose measures. - He was given IV fluids. _ The patient's glucoses have been controlled.  - For discharge the patient's lantus has been increased to 36 units daily. His prandial insulin of 3 units with meals has been increased to 5 units for the evening meal. He will need to record his glucoses twice a day and take them in to his visit with his PCP for his post discharge visit in 7-10 days.  Acute kidney injury superimposed on chronic kidney disease (HCC) - Creatinine is at baseline.  Elevated troponin - Cardiology was consulted. The patient has chronic chest pain and chronically increased troponin. No further investigation or intervention is required.  Dyslipidemia - We will continue statin therapy.  OSA on CPAP - We will continue CPAP nightly.  Essential hypertension Continue Coreg.  I have seen and examined this patient myself. I have spent 38 minutes in his evaluation and care.   Consultants: Cardiology Procedures performed: None  Disposition: Home Diet recommendation:  Discharge Diet Orders (From admission, onward)     Start     Ordered   01/19/22 0000  Diet - low sodium heart healthy  01/19/22 1119   01/19/22 0000  Diet Carb Modified        01/19/22 1119           Cardiac and Carb modified diet DISCHARGE MEDICATION: Allergies as of 01/19/2022       Reactions   Metformin And Related Nausea And Vomiting   Prednisone Other (See Comments)   Reaction: Hallucinations    Zantac [ranitidine Hcl] Diarrhea, Nausea Only   Night sweats        Medication List     STOP taking these medications     FreeStyle Libre 2 Reader Devi       TAKE these medications    Basaglar KwikPen 100 UNIT/ML Inject 36 Units into the skin at bedtime. What changed: how much to take   carvedilol 6.25 MG tablet Commonly known as: COREG Take 1 tablet (6.25 mg total) by mouth 2 (two) times daily with a meal.   Combivent Respimat 20-100 MCG/ACT Aers respimat Generic drug: Ipratropium-Albuterol Inhale 1 puff into the lungs every 6 (six) hours as needed for wheezing.   dapagliflozin propanediol 10 MG Tabs tablet Commonly known as: FARXIGA Take 1 tablet (10 mg total) by mouth daily.   ezetimibe 10 MG tablet Commonly known as: ZETIA Take 1 tablet (10 mg total) by mouth daily.   fenofibrate 145 MG tablet Commonly known as: TRICOR Take 1 tablet (145 mg total) by mouth daily.   insulin lispro 100 UNIT/ML KwikPen Commonly known as: HUMALOG Inject 3 units before breakfast and lunch. 5 units before dinner. What changed:  how much to take how to take this when to take this additional instructions   nitroGLYCERIN 0.4 MG SL tablet Commonly known as: NITROSTAT Place 1 tablet (0.4 mg total) under the tongue every 5 (five) minutes x 3 doses as needed for chest pain.   Pen Needles 32G X 4 MM Misc 200 each by Does not apply route at bedtime.   potassium chloride SA 20 MEQ tablet Commonly known as: Klor-Con M20 Take 1 tablet by mouth daily (Take 1 tablet daily)   rivaroxaban 20 MG Tabs tablet Commonly known as: XARELTO Take 1 tablet (20 mg total) by mouth daily with supper.   rosuvastatin 20 MG tablet Commonly known as: CRESTOR Take 1 tablet (20 mg total) by mouth daily.   sacubitril-valsartan 24-26 MG Commonly known as: ENTRESTO Take 1 tablet by mouth 2 (two) times daily.   sildenafil 100 MG tablet Commonly known as: VIAGRA Take 1 tablets 1 hour prior to intercourse.   spironolactone 25 MG tablet Commonly known as: ALDACTONE Take 1 tablet (25 mg total) by mouth daily.   Torsemide  40 MG Tabs Take 1 tablet by mouth two times daily and take an additional 2 tablets as needed for weight gain (Take 40 mg by mouth 2 (two) times daily. And additionally '80mg'$  as needed for weight gain)   Trulicity 1.5 ZH/2.9JM Sopn Generic drug: Dulaglutide Inject 1.5 mg into the skin once a week.        Discharge Exam: Filed Weights   01/17/22 1950 01/18/22 1736  Weight: 106.6 kg 106.7 kg   Vitals:   01/19/22 0503 01/19/22 0755  BP: (!) 99/48 (!) 144/86  Pulse: 64 70  Resp:  20  Temp: 97.9 F (36.6 C) 98.1 F (36.7 C)  SpO2: 93% 100%   Exam:  Constitutional:  The patient is awake, alert, and oriented x 3. No acute distress. Respiratory:  No increased work of breathing. No wheezes, rales,  or rhonchi No tactile fremitus Cardiovascular:  Regular rate and rhythm No murmurs, ectopy, or gallups. No lateral PMI. No thrills. Abdomen:  Abdomen is soft, non-tender, non-distended No hernias, masses, or organomegaly Normoactive bowel sounds.  Musculoskeletal:  No cyanosis, clubbing, or edema Skin:  No rashes, lesions, ulcers palpation of skin: no induration or nodules Neurologic:  CN 2-12 intact Sensation all 4 extremities intact Psychiatric:  Mental status Mood, affect appropriate Orientation to person, place, time  judgment and insight appear intact   Condition at discharge: fair  The results of significant diagnostics from this hospitalization (including imaging, microbiology, ancillary and laboratory) are listed below for reference.   Imaging Studies: DG Chest 2 View  Result Date: 01/17/2022 CLINICAL DATA:  Dizziness EXAM: CHEST - 2 VIEW COMPARISON:  12/04/2021 FINDINGS: Heart and mediastinal contours are within normal limits. No focal opacities or effusions. No acute bony abnormality. IMPRESSION: No active cardiopulmonary disease. Electronically Signed   By: Rolm Baptise M.D.   On: 01/17/2022 20:13    Microbiology: Results for orders placed or performed  during the hospital encounter of 11/24/21  Resp Panel by RT-PCR (Flu A&B, Covid) Nasopharyngeal Swab     Status: None   Collection Time: 11/24/21  6:05 AM   Specimen: Nasopharyngeal Swab; Nasopharyngeal(NP) swabs in vial transport medium  Result Value Ref Range Status   SARS Coronavirus 2 by RT PCR NEGATIVE NEGATIVE Final    Comment: (NOTE) SARS-CoV-2 target nucleic acids are NOT DETECTED.  The SARS-CoV-2 RNA is generally detectable in upper respiratory specimens during the acute phase of infection. The lowest concentration of SARS-CoV-2 viral copies this assay can detect is 138 copies/mL. A negative result does not preclude SARS-Cov-2 infection and should not be used as the sole basis for treatment or other patient management decisions. A negative result may occur with  improper specimen collection/handling, submission of specimen other than nasopharyngeal swab, presence of viral mutation(s) within the areas targeted by this assay, and inadequate number of viral copies(<138 copies/mL). A negative result must be combined with clinical observations, patient history, and epidemiological information. The expected result is Negative.  Fact Sheet for Patients:  EntrepreneurPulse.com.au  Fact Sheet for Healthcare Providers:  IncredibleEmployment.be  This test is no t yet approved or cleared by the [place] FDA and  has been authorized for detection and/or diagnosis of SARS-CoV-2 by FDA under an Emergency Use Authorization (EUA). This EUA will remain  in effect (meaning this test can be used) for the duration of the COVID-19 declaration under Section 564(b)(1) of the Act, 21 U.S.C.section 360bbb-3(b)(1), unless the authorization is terminated  or revoked sooner.       Influenza A by PCR NEGATIVE NEGATIVE Final   Influenza B by PCR NEGATIVE NEGATIVE Final    Comment: (NOTE) The Xpert Xpress SARS-CoV-2/FLU/RSV plus assay is intended as an  aid in the diagnosis of influenza from Nasopharyngeal swab specimens and should not be used as a sole basis for treatment. Nasal washings and aspirates are unacceptable for Xpert Xpress SARS-CoV-2/FLU/RSV testing.  Fact Sheet for Patients: EntrepreneurPulse.com.au  Fact Sheet for Healthcare Providers: IncredibleEmployment.be  This test is not yet approved or cleared by the [place] FDA and has been authorized for detection and/or diagnosis of SARS-CoV-2 by FDA under an Emergency Use Authorization (EUA). This EUA will remain in effect (meaning this test can be used) for the duration of the COVID-19 declaration under Section 564(b)(1) of the Act, 21 U.S.C. section 360bbb-3(b)(1), unless the authorization is terminated or revoked.  Performed at [facility], [address] [place], [place] [address]   Blood culture (single)     Status: None   Collection Time: 11/24/21  7:01 AM   Specimen: BLOOD  Result Value Ref Range Status   Specimen Description BLOOD BLOOD RIGHT ARM  Final   Special Requests   Final    BOTTLES DRAWN AEROBIC AND ANAEROBIC Blood Culture adequate volume   Culture   Final    NO GROWTH 6 DAYS Performed at [facility], [address] [place], [place] [address]    Report Status 11/30/2021 FINAL  Final    Labs: CBC: Recent Labs  Lab 01/17/22 1952 01/18/22 0559 01/19/22 0415  WBC 10.6* 9.8 7.8  NEUTROABS  --   --  4.1  HGB 17.6* 16.1 15.1  HCT 50.6 46.3 45.3  MCV 80.1 80.0 82.1  PLT 193 155 239*   Basic Metabolic Panel: Recent Labs  Lab 01/17/22 1952 01/17/22 2231 01/18/22 0117 01/18/22 0559 01/18/22 1308 01/19/22 0415  NA 126*  --  130* 133* 134* 135  K 3.0*  --  2.7* 3.1* 3.8 3.9  CL 85*  --  90* 91* 94* 96*  CO2 25  --  '29 28 31 29  '$ GLUCOSE 534*  --  299* 222* 159* 152*  BUN 62*  --  67* 67* 65* 61*  CREATININE 2.42*  --  2.41* 2.27* 2.25* 2.06*  CALCIUM 10.0  --  9.9 9.4 9.0  9.6  MG  --  2.4  --   --   --   --    Liver Function Tests: No results for input(s): AST, ALT, ALKPHOS, BILITOT, PROT, ALBUMIN in the last 168 hours. CBG: Recent Labs  Lab 01/18/22 1017 01/18/22 1458 01/18/22 2042 01/19/22 0050 01/19/22 0754  GLUCAP 197* 152* 307* 151* 178*    Discharge time spent: greater than 30 minutes.  Signed: Arwin Bisceglia, DO [facility] 01/19/2022

## 2022-01-19 NOTE — Plan of Care (Signed)
  Problem: Education: Goal: Ability to describe self-care measures that may prevent or decrease complications (Diabetes Survival Skills Education) will improve Outcome: Adequate for Discharge Goal: Individualized Educational Video(s) Outcome: Adequate for Discharge   Problem: Coping: Goal: Ability to adjust to condition or change in health will improve Outcome: Adequate for Discharge   Problem: Fluid Volume: Goal: Ability to maintain a balanced intake and output will improve Outcome: Adequate for Discharge   Problem: Health Behavior/Discharge Planning: Goal: Ability to identify and utilize available resources and services will improve Outcome: Adequate for Discharge Goal: Ability to manage health-related needs will improve Outcome: Adequate for Discharge   Problem: Metabolic: Goal: Ability to maintain appropriate glucose levels will improve Outcome: Adequate for Discharge   Problem: Nutritional: Goal: Maintenance of adequate nutrition will improve Outcome: Adequate for Discharge Goal: Progress toward achieving an optimal weight will improve Outcome: Adequate for Discharge   Problem: Skin Integrity: Goal: Risk for impaired skin integrity will decrease Outcome: Adequate for Discharge   Problem: Tissue Perfusion: Goal: Adequacy of tissue perfusion will improve Outcome: Adequate for Discharge   

## 2022-01-19 NOTE — Progress Notes (Signed)
Patient ID: Gabriel Selsor., male    DOB: [birth year], 51 y.o.   MRN: [id number]  HPI  Gabriel [name] is a 51 y/o male with a history of HTN, diabetes, COPD, CKD, asthma, previous tobacco use and chronic heart failure.    Echo report from 11/24/21 reviewed and showed an EF of 20-25% along with mild LVH and mild Gabriel. Echo results from 05/21/21 reviewed and showed an EF of 40-45% along with mild LVH and mild Gabriel. Echo report from 05/21/21 reviewed and showed an EF of 40-45% along with mild LVH and mild Gabriel. Echo report from 06/08/20 reviewed and showed an EF of 35-40% along with mildly elevated PA pressure of 44.7 mmHg, moderate LAE and mild/moderate Gabriel. Echo report from 11/23/19 reviewed and showed an EF of 25-30% with mild LVH and moderately elevated PA Pressure. Echo report from 06/26/2019 reviewed and showed an EF of 30-35% along with moderately elevated PA pressure. Echo done 04/29/17 showed EF 45-50%. Echo report that was done 11/28/16 and it showed an EF of 40-45% along with mild Gabriel. EF has improved from 30-35% July 2017.   RHC/LHC done 06/13/20 and showed: Severe single-vessel coronary artery disease with chronic total occlusion of the proximal RCA.  The distal vessel fills via left to right collaterals. Mild to moderate, nonobstructive CAD involving the LAD and LCx with up to 50% stenosis. Mildly elevated left and right heart filling pressures. Moderate pulmonary hypertension (mean PAP 40 mmHg) with significantly elevated pulmonary vascular resistance (PVR 7.4 WU). Severely reduced Fick cardiac output/index.   Admitted 01/17/22 due to nausea and vomiting, polydipsia, polyuria. Blood glucose was elevated at 534. Creatinine was 2.42. Cardiology consult obtained. Given IVF and insulin. Lantus increased. Discharged after 2 days. Was in the ED 12/04/21 due to acute on chronic heart failure where he was treated and released. Admitted 11/24/21 due to SOB and abdominal pain. CT scan of the abdomen and liver ultrasound  were concerning for cholecystitis.  However, HIDA scan did not show any evidence of cholecystitis.    Cardiology and surgical consults obtained. Initially given IV lasix with transition to oral diuretics. Started on spironolactone. Discharged after 4 days. Admitted 09/27/21 due to worsening SOB due to HF exacerbation. Initially given IV lasix with transition to oral diuretics with resultant loss of 6L. Cardiology consult obtained. Discharged after 2 days. Admitted 08/09/21 due to acute on chronic heart failure. Initially given IV lasix with transition to oral diuretics. Entresto held due to AKI but then resumed prior to discharge. Discharged after 4 days.   He presents today for a follow-up visit with a chief complaint of minimal fatigue upon moderate exertion. Describes this as chronic in nature. He has associated shortness of breath, chest pain (unchanged), intermittent dizziness & chronic headache along with this. He denies any difficulty sleeping, abdominal distention, palpitations, pedal edema, cough or weight gain.   Says that his biggest complaint is of this dull, constant headache that he's had for 3 weeks. Says it's over his forehead and doesn't go away. Has had migraines but says that this different. Hasn't taken any NSAIDS because he was told he shouldn't due to his heart but says that tylenol doesn't touch it. Does report continued stress because he's been out of work since early April 2023 so has no money coming in. Working on disability again as well as trying to find out about medicaid.   Past Medical History:  Diagnosis Date   CAD (coronary artery disease)  a. 04/2015 low risk MV;  b. 12/2016 Cath: minor irregs in LAD/Diag/LCX/OM, RCA 40p/m/d; c. 05/2020 Cath: LM nl, LAD 50d, LCX 30p, OM1 40, RCA 100p w/ L->R collats. CO/CI 3.1/1.3-->Med rx.   Chest wall pain, chronic    Chronic Troponin Elevation    CKD (chronic kidney disease), stage II-III    COPD (chronic obstructive pulmonary disease)  (HCC)    Diabetes mellitus without complication ([place])    HFrEF (heart failure with reduced ejection fraction) ([place])    a. 03/2015 Echo: EF 45-50%; b. 12/2015 Echo: EF 20-25%; c. 02/2016 Echo: EF 30-35%; d. 11/2016 Echo: EF 40-45%; e. 06/2019 Echo: EF 30-35%; f. 11/2019 Echo: EF 25-30%; g. 05/2020 Echo: EF 35-40%; h. 05/2021 Echo: EF 40-45%; i. 11/2021 Echo: EF 20-25%, glob HK, mild LVH, GrIII DD, sev red RV fxn, mild Gabriel.   Hypertension    Mitral regurgitation    Mild to moderate by October 2021 echocardiogram.   Mixed Ischemic & NICM (nonischemic cardiomyopathy) ([place])    a. 03/2015 Echo: EF 45-50%; b. 12/2015 Echo: EF 20-25%; c. 02/2016 Echo: EF 30-35%; d. 11/2016 Echo: EF 40-45%; e. 06/2019 Echo: EF 30-35%; f. 11/2019 Echo: EF 25-30%; g. 05/2020 Echo: EF 35-40%; h. 05/2021 Echo: EF 40-45%; i. 11/2021 Echo: EF 20-25%   Myocardial infarct (HCC)    NSVT (nonsustained ventricular tachycardia) ([place])    a. 12/2015 noted on tele-->amio;  b. 12/2015 Event monitor: no VT noted.   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  12/31/19: RUL and RLL PEs.   Syncope    a. 01/2016 - felt to be vasovagal.   Past Surgical History:  Procedure Laterality Date   AMPUTATION     CARDIAC CATHETERIZATION     FINGER AMPUTATION     Traumatic   FINGER FRACTURE SURGERY Left    LEFT HEART CATH AND CORONARY ANGIOGRAPHY N/A 01/06/2017   Procedure: Left Heart Cath and Coronary Angiography;  Surgeon: Wellington Hampshire, MD;  Location: [facility];  Service: Cardiovascular;  Laterality: N/A;   RIGHT/LEFT HEART CATH AND CORONARY ANGIOGRAPHY N/A 06/13/2020   Procedure: RIGHT/LEFT HEART CATH AND CORONARY ANGIOGRAPHY;  Surgeon: Nelva Bush, MD;  Location: [facility];  Service: Cardiovascular;  Laterality: N/A;   Family History  Problem Relation Age of Onset   Diabetes Mother    Diabetes Mellitus II Mother    Hypothyroidism Mother     Hypertension Mother    Kidney failure Mother        Dialysis   Heart attack Mother        52 yo approximately   Hypertension Father    Gout Father    Cancer Maternal Grandfather    Diabetes Maternal Grandfather    Cancer Paternal Aunt    Social History   Tobacco Use   Smoking status: Former    Packs/day: 0.50    Years: 33.00    Pack years: 16.50    Types: Cigarettes    Quit date: 05/08/2020    Years since quitting: 1.7   Smokeless tobacco: Former    Quit date: 05/08/2020   Tobacco comments:    Quit Sept 2021  Substance Use Topics   Alcohol use: Not Currently    Comment: occassionally   Allergies  Allergen Reactions   Metformin And Related Nausea And Vomiting   Prednisone Other (See Comments)    Reaction: Hallucinations  Zantac [Ranitidine Hcl] Diarrhea and Nausea Only    Night sweats   Prior to Admission medications   Medication Sig Start Date End Date Taking? Authorizing Provider  carvedilol (COREG) 6.25 MG tablet Take 1 tablet (6.25 mg total) by mouth 2 (two) times daily with a meal. 01/15/22  Yes [name] A, FNP  dapagliflozin propanediol (FARXIGA) 10 MG TABS tablet Take 1 tablet (10 mg total) by mouth daily. 01/15/22  Yes Jagger Beahm A, FNP  Dulaglutide (TRULICITY) 1.5 MW/1.0UV SOPN Inject 1.5 mg into the skin once a week. 10/08/21  Yes Tally Joe T, FNP  ezetimibe (ZETIA) 10 MG tablet Take 1 tablet (10 mg total) by mouth daily. 01/15/22  Yes Christipher Rieger A, FNP  fenofibrate (TRICOR) 145 MG tablet Take 1 tablet (145 mg total) by mouth daily. 01/15/22  Yes Nalah Macioce, Otila Kluver A, FNP  Insulin Glargine (BASAGLAR KWIKPEN) 100 UNIT/ML Inject 36 Units into the skin at bedtime. 01/19/22 02/09/23 Yes Swayze, Ava, DO  insulin lispro (HUMALOG) 100 UNIT/ML KwikPen Inject 3 units before breakfast and lunch. 5 units before dinner. 01/19/22  Yes Swayze, Ava, DO  Insulin Pen Needle (PEN NEEDLES) 32G X 4 MM MISC 200 each by Does not apply route at bedtime. 09/05/21  Yes Tally Joe T,  FNP  Ipratropium-Albuterol (COMBIVENT RESPIMAT) 20-100 MCG/ACT AERS respimat Inhale 1 puff into the lungs every 6 (six) hours as needed for wheezing.   Yes [provider]  naproxen (NAPROSYN) 500 MG tablet Take 1 tablet (500 mg total) by mouth daily as needed. 01/21/22  Yes Mosie Angus, Otila Kluver A, FNP  nitroGLYCERIN (NITROSTAT) 0.4 MG SL tablet Place 1 tablet (0.4 mg total) under the tongue every 5 (five) minutes x 3 doses as needed for chest pain. 06/09/20  Yes Lorella Nimrod, MD  potassium chloride SA (KLOR-CON M20) 20 MEQ tablet Take 1 tablet daily 01/15/22  Yes Skyleigh Windle, Aura Fey, FNP  rivaroxaban (XARELTO) 20 MG TABS tablet Take 1 tablet (20 mg total) by mouth daily with supper. 01/15/22  Yes [name] A, FNP  rosuvastatin (CRESTOR) 20 MG tablet Take 1 tablet (20 mg total) by mouth daily. 01/15/22  Yes Koji Niehoff A, FNP  sacubitril-valsartan (ENTRESTO) 24-26 MG Take 1 tablet by mouth 2 (two) times daily. 12/20/21  Yes [name] A, FNP  spironolactone (ALDACTONE) 25 MG tablet Take 1 tablet (25 mg total) by mouth daily. 01/15/22  Yes Jonta Gastineau A, FNP  Torsemide 40 MG TABS Take 40 mg by mouth 2 (two) times daily. And additionally '80mg'$  as needed for weight gain 01/15/22  Yes [name] A, FNP  sildenafil (VIAGRA) 100 MG tablet Take 1 tablets 1 hour prior to intercourse. Patient not taking: Reported on 01/21/2022 11/19/21   Abbie Sons, MD    Review of Systems  Constitutional:  Positive for fatigue. Negative for appetite change.  HENT:  Negative for congestion, postnasal drip and sore throat.   Eyes: Negative.   Respiratory:  Positive for shortness of breath. Negative for cough and wheezing.   Cardiovascular:  Positive for chest pain (unchanged). Negative for palpitations and leg swelling.  Gastrointestinal:  Negative for abdominal distention and abdominal pain.  Endocrine: Negative.   Genitourinary: Negative.   Musculoskeletal:  Negative for back pain and neck pain.  Skin: Negative.    Allergic/Immunologic: Negative.   Neurological:  Positive for dizziness and headaches (chronic across forehead). Negative for light-headedness.  Hematological:  Negative for adenopathy. Does not bruise/bleed easily.  Psychiatric/Behavioral:  Negative for dysphoric mood  and sleep disturbance. The patient is not nervous/anxious.    Vitals:   01/21/22 0809  BP: 132/74  Pulse: 78  Resp: 20  SpO2: 99%  Weight: 235 lb 2 oz (106.7 kg)  Height: 6' (1.829 m)   Wt Readings from Last 3 Encounters:  01/21/22 235 lb 2 oz (106.7 kg)  01/18/22 235 lb 3.2 oz (106.7 kg)  01/15/22 238 lb (108 kg)   Lab Results  Component Value Date   CREATININE 2.06 (H) 01/19/2022   CREATININE 2.25 (H) 01/18/2022   CREATININE 2.27 (H) 01/18/2022   Physical Exam Vitals and nursing note reviewed.  Constitutional:      Appearance: He is well-developed.  HENT:     Head: Normocephalic and atraumatic.  Cardiovascular:     Rate and Rhythm: Normal rate and regular rhythm.  Pulmonary:     Effort: Pulmonary effort is normal. No accessory muscle usage.     Breath sounds: No wheezing, rhonchi or rales.  Abdominal:     Palpations: Abdomen is soft.     Tenderness: There is no abdominal tenderness.  Musculoskeletal:     Right lower leg: No tenderness. No edema.     Left lower leg: No tenderness. No edema.  Skin:    General: Skin is warm and dry.  Neurological:     General: No focal deficit present.     Mental Status: He is alert and oriented to person, place, and time.  Psychiatric:        Mood and Affect: Mood normal.        Behavior: Behavior normal.   Assessment & Plan:   1: Chronic heart failure with reduced ejection fraction- - NYHA class II - euvolemic - weighing daily and reminded to call for an overnight weight gain of >2 pounds or a weekly weight gain of >5 pounds - weight stable from last visit here 1 month ago  - not adding salt and he was encouraged to closely follow a '2000mg'$  sodium diet -  saw cardiology Kathlen Mody) 01/15/22 - on GDMT of carvedilol, farxiga, spironolactone & entresto  - has upcoming echo in a couple of weeks - saw EP Caryl Comes) 09/02/19; discussion about ICD  - saw provider at [facility] 07/06/20 - BNP from 12/04/21 was 243.0   2: HTN- - BP looks good (132/74) - saw PCP Rollene Rotunda) 11/05/21 - BMP from 01/19/22 reviewed and showed sodium 135, potassium 3.9, creatinine 2.06 and GFR 38   3: Diabetes-   -  A1c 8.7% on 11/24/21 - home glucose today was 559 - on trulicity  4: Headache- - advised that he could take 1 OTC naproxyn no more than once a day - emphasized to take it with food - call PCP to see if he can be seen sooner due to the dull constant headache     Patient did not bring his medications nor a list. Each medication was verbally reviewed with the patient and he was encouraged to bring the bottles to every visit to confirm accuracy of list.   Return in 3 months, sooner if needed.

## 2022-01-19 NOTE — Progress Notes (Signed)
Patient educated on AVS including medication changes and where to pickup.  PIV was removed and telemetry dc and notified.  Patient dressed and walked self to personal car that he drove here.  Refused wheelchair.  No s/s of distress noted denies pain.

## 2022-01-20 ENCOUNTER — Other Ambulatory Visit: Payer: Self-pay

## 2022-01-21 ENCOUNTER — Ambulatory Visit: Payer: BC Managed Care – PPO | Attending: Family | Admitting: Family

## 2022-01-21 ENCOUNTER — Other Ambulatory Visit: Payer: Self-pay

## 2022-01-21 ENCOUNTER — Encounter: Payer: Self-pay | Admitting: Family

## 2022-01-21 VITALS — BP 132/74 | HR 78 | Resp 20 | Ht 72.0 in | Wt 235.1 lb

## 2022-01-21 DIAGNOSIS — I252 Old myocardial infarction: Secondary | ICD-10-CM | POA: Diagnosis not present

## 2022-01-21 DIAGNOSIS — Z7985 Long-term (current) use of injectable non-insulin antidiabetic drugs: Secondary | ICD-10-CM | POA: Insufficient documentation

## 2022-01-21 DIAGNOSIS — Z79899 Other long term (current) drug therapy: Secondary | ICD-10-CM | POA: Insufficient documentation

## 2022-01-21 DIAGNOSIS — I272 Pulmonary hypertension, unspecified: Secondary | ICD-10-CM | POA: Insufficient documentation

## 2022-01-21 DIAGNOSIS — Z888 Allergy status to other drugs, medicaments and biological substances status: Secondary | ICD-10-CM | POA: Insufficient documentation

## 2022-01-21 DIAGNOSIS — Z87891 Personal history of nicotine dependence: Secondary | ICD-10-CM | POA: Diagnosis not present

## 2022-01-21 DIAGNOSIS — Z86711 Personal history of pulmonary embolism: Secondary | ICD-10-CM | POA: Insufficient documentation

## 2022-01-21 DIAGNOSIS — I10 Essential (primary) hypertension: Secondary | ICD-10-CM

## 2022-01-21 DIAGNOSIS — Z6830 Body mass index (BMI) 30.0-30.9, adult: Secondary | ICD-10-CM | POA: Diagnosis not present

## 2022-01-21 DIAGNOSIS — E669 Obesity, unspecified: Secondary | ICD-10-CM | POA: Insufficient documentation

## 2022-01-21 DIAGNOSIS — E1122 Type 2 diabetes mellitus with diabetic chronic kidney disease: Secondary | ICD-10-CM | POA: Diagnosis not present

## 2022-01-21 DIAGNOSIS — I130 Hypertensive heart and chronic kidney disease with heart failure and stage 1 through stage 4 chronic kidney disease, or unspecified chronic kidney disease: Secondary | ICD-10-CM | POA: Insufficient documentation

## 2022-01-21 DIAGNOSIS — Z794 Long term (current) use of insulin: Secondary | ICD-10-CM

## 2022-01-21 DIAGNOSIS — G8929 Other chronic pain: Secondary | ICD-10-CM

## 2022-01-21 DIAGNOSIS — I2510 Atherosclerotic heart disease of native coronary artery without angina pectoris: Secondary | ICD-10-CM | POA: Diagnosis not present

## 2022-01-21 DIAGNOSIS — I5022 Chronic systolic (congestive) heart failure: Secondary | ICD-10-CM

## 2022-01-21 DIAGNOSIS — Z7984 Long term (current) use of oral hypoglycemic drugs: Secondary | ICD-10-CM | POA: Diagnosis not present

## 2022-01-21 DIAGNOSIS — J449 Chronic obstructive pulmonary disease, unspecified: Secondary | ICD-10-CM | POA: Diagnosis not present

## 2022-01-21 DIAGNOSIS — R519 Headache, unspecified: Secondary | ICD-10-CM

## 2022-01-21 DIAGNOSIS — N1832 Chronic kidney disease, stage 3b: Secondary | ICD-10-CM

## 2022-01-21 DIAGNOSIS — N182 Chronic kidney disease, stage 2 (mild): Secondary | ICD-10-CM | POA: Insufficient documentation

## 2022-01-21 MED ORDER — NAPROXEN 500 MG PO TABS: 500.0000 mg | ORAL_TABLET | Freq: Every day | ORAL | 5 refills | Status: DC | PRN

## 2022-01-21 MED ORDER — INSULIN ASPART 100 UNIT/ML FLEXPEN
PEN_INJECTOR | SUBCUTANEOUS | 0 refills | Status: DC
  Filled 2022-01-21: qty 9, 81d supply, fill #0

## 2022-01-21 NOTE — Patient Instructions (Addendum)
Continue weighing daily and call for an overnight weight gain of 3 pounds or more or a weekly weight gain of more than 5 pounds.   If you have voicemail, please make sure your mailbox is cleaned out so that we may leave a message and please make sure to listen to any voicemails.    Take over the counter naproxsyn  today and see if that helps your headache. Do not take more than 1 every day.

## 2022-01-22 ENCOUNTER — Other Ambulatory Visit: Payer: Self-pay

## 2022-01-22 ENCOUNTER — Ambulatory Visit: Payer: BC Managed Care – PPO | Admitting: Podiatry

## 2022-01-23 ENCOUNTER — Other Ambulatory Visit: Payer: Self-pay | Admitting: Pharmacy Technician

## 2022-01-23 NOTE — Patient Outreach (Signed)
Patient has prescription drug coverage with [employer].  No longer qualifies for free medications.  [facility]

## 2022-01-25 ENCOUNTER — Other Ambulatory Visit: Payer: Self-pay

## 2022-02-05 NOTE — Progress Notes (Signed)
Established patient visit   Patient: Gabriel [name].   DOB: 07-22-1971   51 y.o. Male  MRN: [id number] Visit Date: 02/08/2022  Today's healthcare provider: Jacky Kindle, FNP  Introduced to nurse practitioner role and practice setting.  All questions answered.  Discussed provider/patient relationship and expectations.   I,Tiffany J Bragg,acting as a scribe for Jacky Kindle, FNP.,have documented all relevant documentation on the behalf of Jacky Kindle, FNP,as directed by  Jacky Kindle, FNP while in the presence of Jacky Kindle, FNP.   Chief Complaint  Patient presents with   Diabetes   Hyperlipidemia   Well Child   Anxiety    Patient complains of mind racing when he lays down to sleep at night.    Subjective    HPI HPI     Anxiety    Additional comments: Patient complains of mind racing when he lays down to sleep at night.       Last edited by Marlana Salvage, CMA on 02/08/2022  9:04 AM.      Diabetes Mellitus Type II, Follow-up  Lab Results  Component Value Date   HGBA1C 9.8 (A) 02/08/2022   HGBA1C 8.7 (H) 11/24/2021   HGBA1C 9.9 (H) 11/05/2021   Wt Readings from Last 3 Encounters:  02/08/22 240 lb 3.2 oz (109 kg)  01/21/22 235 lb 2 oz (106.7 kg)  01/18/22 235 lb 3.2 oz (106.7 kg)   Last seen for diabetes 3 months ago.  Management since then includes continue medication, diet and exercise recommended. He reports excellent compliance with treatment. He is not having side effects. Symptoms: Yes fatigue No foot ulcerations  Yes appetite changes Yes nausea  No paresthesia of the feet  Yes polydipsia  Yes polyuria Yes visual disturbances   No vomiting     Home blood sugar records: fasting range: around 120. Non-fasting up to 300  Episodes of hypoglycemia? No    Current insulin regiment: 3 units AM, 3 units lunch, 5 units dinner, 36 at bedtime Most Recent Eye Exam: unknown; referral placed today. Current exercise: walking Current diet habits:  in general, a "healthy" diet   low sodium, low sugar  Pertinent Labs: Lab Results  Component Value Date   CHOL 110 11/25/2021   HDL 17 (L) 11/25/2021   LDLCALC 62 11/25/2021   LDLDIRECT 60.6 11/23/2020   TRIG 153 (H) 11/25/2021   CHOLHDL 6.5 11/25/2021   Lab Results  Component Value Date   NA 135 01/19/2022   K 3.9 01/19/2022   CREATININE 2.06 (H) 01/19/2022   GFRNONAA 38 (L) 01/19/2022   LABMICR Comment 03/08/2020     ---------------------------------------------------------------------------------------------------  Lipid/Cholesterol, Follow-up  Last lipid panel Other pertinent labs  Lab Results  Component Value Date   CHOL 110 11/25/2021   HDL 17 (L) 11/25/2021   LDLCALC 62 11/25/2021   LDLDIRECT 60.6 11/23/2020   TRIG 153 (H) 11/25/2021   CHOLHDL 6.5 11/25/2021   Lab Results  Component Value Date   ALT 61 (H) 12/04/2021   AST 31 12/04/2021   PLT 132 (L) 01/19/2022   TSH 1.270 11/05/2021     He was last seen for this 3 months ago.  Management since that visit includes zetia 10, tricor 145, crestor 20 mg .  He reports excellent compliance with treatment. He is not having side effects.   Symptoms: Yes chest pain Yes chest pressure/discomfort  Yes dyspnea No lower extremity edema  No numbness or tingling  of extremity No orthopnea  No palpitations Yes paroxysmal nocturnal dyspnea  No speech difficulty No syncope   Current diet: low salt low sugar Current exercise: walking  The ASCVD Risk score (Arnett DK, et al., 2019) failed to calculate for the following reasons:   The patient has a prior MI or stroke diagnosis   Medications: Outpatient Medications Prior to Visit  Medication Sig   carvedilol (COREG) 6.25 MG tablet Take 1 tablet (6.25 mg total) by mouth 2 (two) times daily with a meal.   dapagliflozin propanediol (FARXIGA) 10 MG TABS tablet Take 1 tablet (10 mg total) by mouth daily.   ezetimibe (ZETIA) 10 MG tablet Take 1 tablet (10 mg total) by mouth  daily.   fenofibrate (TRICOR) 145 MG tablet Take 1 tablet (145 mg total) by mouth daily.   insulin aspart (NOVOLOG) 100 UNIT/ML FlexPen Inject 3 units into the skin before breakfast and lunch and 5 units before dinner.   Insulin Glargine (BASAGLAR KWIKPEN) 100 UNIT/ML Inject 36 Units into the skin at bedtime.   Insulin Pen Needle (PEN NEEDLES) 32G X 4 MM MISC 200 each by Does not apply route at bedtime.   Ipratropium-Albuterol (COMBIVENT RESPIMAT) 20-100 MCG/ACT AERS respimat Inhale 1 puff into the lungs every 6 (six) hours as needed for wheezing.   naproxen (NAPROSYN) 500 MG tablet Take 1 tablet (500 mg total) by mouth daily as needed.   nitroGLYCERIN (NITROSTAT) 0.4 MG SL tablet Place 1 tablet (0.4 mg total) under the tongue every 5 (five) minutes x 3 doses as needed for chest pain.   potassium chloride SA (KLOR-CON M20) 20 MEQ tablet Take 1 tablet daily   rivaroxaban (XARELTO) 20 MG TABS tablet Take 1 tablet (20 mg total) by mouth daily with supper.   rosuvastatin (CRESTOR) 20 MG tablet Take 1 tablet (20 mg total) by mouth daily.   sacubitril-valsartan (ENTRESTO) 24-26 MG Take 1 tablet by mouth 2 (two) times daily.   sildenafil (VIAGRA) 100 MG tablet Take 1 tablets 1 hour prior to intercourse.   spironolactone (ALDACTONE) 25 MG tablet Take 1 tablet (25 mg total) by mouth daily.   torsemide (DEMADEX) 20 MG tablet Take 2 tablets (40 mg total) by mouth 2 (two) times daily.   [DISCONTINUED] Dulaglutide (TRULICITY) 1.5 MG/0.5ML SOPN Inject 1.5 mg into the skin once a week.   [DISCONTINUED] TRULICITY 0.75 MG/0.5ML SOPN Inject 0.75 mg into the skin once a week.   No facility-administered medications prior to visit.    Review of Systems     Objective    BP 107/72 (BP Location: Right Arm, Patient Position: Sitting, Cuff Size: Large)   Pulse 84   Temp 98.3 F (36.8 C) (Oral)   Resp 17   Ht 5\' 10"  (1.778 m)   Wt 240 lb 3.2 oz (109 kg)   SpO2 98%   BMI 34.47 kg/m    Physical Exam Vitals  and nursing note reviewed.  Constitutional:      Appearance: Normal appearance. He is obese.  HENT:     Head: Normocephalic and atraumatic.  Eyes:     Pupils: Pupils are equal, round, and reactive to light.  Cardiovascular:     Rate and Rhythm: Normal rate and regular rhythm.     Pulses: Normal pulses.     Heart sounds: Murmur heard.  Pulmonary:     Effort: Pulmonary effort is normal.     Breath sounds: Normal breath sounds.  Musculoskeletal:        General:  Normal range of motion.     Cervical back: Normal range of motion.  Skin:    General: Skin is warm and dry.     Capillary Refill: Capillary refill takes less than 2 seconds.  Neurological:     General: No focal deficit present.     Mental Status: He is alert and oriented to person, place, and time. Mental status is at baseline.  Psychiatric:        Attention and Perception: Attention normal.        Mood and Affect: Mood is depressed. Affect is flat.        Speech: Speech normal.        Behavior: Behavior normal. Behavior is cooperative.        Thought Content: Thought content normal.        Cognition and Memory: Cognition normal.        Judgment: Judgment normal.      Results for orders placed or performed in visit on 02/08/22  POCT glycosylated hemoglobin (Hb A1C)  Result Value Ref Range   Hemoglobin A1C 9.8 (A) 4.0 - 5.6 %   Est. 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 Smaller meal size, adding snacks. Choosing water as drink of choice and increasing purposeful exercise.       Relevant Medications   Dulaglutide (TRULICITY) 3 MG/0.5ML SOPN   Other Relevant Orders   POCT glycosylated hemoglobin (Hb A1C) (Completed)   Ambulatory referral to Ophthalmology     Genitourinary   CKD (chronic kidney disease), stage IIIa    Repeat CMP for creatinine monitor and referral placed to nephro Encourage balance of hydration and less need for PRN diuretics  Chronic, recent exacerbation       Relevant Medications   Dulaglutide (TRULICITY) 3 MG/0.5ML SOPN   Other Relevant Orders   Urine Microalbumin w/creat. ratio   Comprehensive Metabolic Panel (CMET)   Ambulatory referral to Nephrology     Other   Chronic tension-type headache, not intractable    Not improved with APAP alone; avoid NSAIDs r/t HFrEF Trial of Fioricet provided; recommend referral to neuro if necessary Pt also notes poor sleep, trial of trazodone to assist with sleep initiation       Relevant Medications   FLUoxetine (PROZAC) 20 MG capsule   traZODone (DESYREL) 100 MG tablet   butalbital-acetaminophen-caffeine (FIORICET) 50-325-40 MG tablet   Depression, recurrent (HCC)    Chronic, worsening PHQ reviewed at 12 Start prozac at 20 mg Recommend 6-8 week follow up; however, allow 3 months given sheer quantity of other appts with specialists Encourage sooner follow up if symptoms worsen or do not improve I've explained to him that drugs of the SSRI class can  have side effects such as weight gain, sexual dysfunction, insomnia, headache, nausea. These medications are generally effective at alleviating symptoms of anxiety and/or depression. Let me know if significant side effects do occur.        Relevant Medications   FLUoxetine (PROZAC) 20 MG capsule   traZODone (DESYREL) 100 MG tablet     Return in about 3 months (around 05/11/2022) for chonic disease management.      Leilani Merl, FNP, have reviewed all documentation for this visit. The documentation on 02/08/22 for the exam, diagnosis, procedures, and orders are all accurate and complete.    Jacky Kindle, FNP  [facility] [phone] (phone) [phone] (fax)  [facility]

## 2022-02-06 ENCOUNTER — Ambulatory Visit (INDEPENDENT_AMBULATORY_CARE_PROVIDER_SITE_OTHER): Payer: BC Managed Care – PPO

## 2022-02-06 DIAGNOSIS — I428 Other cardiomyopathies: Secondary | ICD-10-CM

## 2022-02-06 LAB — ECHOCARDIOGRAM LIMITED
Area-P 1/2: 4.93 cm2
S' Lateral: 4.9 cm

## 2022-02-06 MED ORDER — PERFLUTREN LIPID MICROSPHERE
1.0000 mL | INTRAVENOUS | Status: AC | PRN
  Administered 2022-02-06: 2 mL via INTRAVENOUS

## 2022-02-07 ENCOUNTER — Telehealth: Payer: Self-pay

## 2022-02-07 DIAGNOSIS — I428 Other cardiomyopathies: Secondary | ICD-10-CM

## 2022-02-07 DIAGNOSIS — I5022 Chronic systolic (congestive) heart failure: Secondary | ICD-10-CM

## 2022-02-07 NOTE — Telephone Encounter (Signed)
Patient made aware of echo results and Cadence Furth, [place] recommendation. Patient is agreeable with the referral to EP for ICD consideration. Adv the patient that a scheduler will contact him to schedule.

## 2022-02-07 NOTE — Telephone Encounter (Signed)
-----   Message from [place], PA-C sent at 02/07/2022  1:39 PM EDT ----- Echo showed LVEF 30-35%. Needs EP referral for ICD

## 2022-02-08 ENCOUNTER — Other Ambulatory Visit: Payer: Self-pay

## 2022-02-08 ENCOUNTER — Encounter: Payer: Self-pay | Admitting: Family Medicine

## 2022-02-08 ENCOUNTER — Ambulatory Visit (INDEPENDENT_AMBULATORY_CARE_PROVIDER_SITE_OTHER): Payer: Medicaid Other | Admitting: Family Medicine

## 2022-02-08 VITALS — BP 107/72 | HR 84 | Temp 98.3°F | Resp 17 | Ht 70.0 in | Wt 240.2 lb

## 2022-02-08 DIAGNOSIS — F339 Major depressive disorder, recurrent, unspecified: Secondary | ICD-10-CM | POA: Diagnosis not present

## 2022-02-08 DIAGNOSIS — Z794 Long term (current) use of insulin: Secondary | ICD-10-CM | POA: Diagnosis not present

## 2022-02-08 DIAGNOSIS — E0822 Diabetes mellitus due to underlying condition with diabetic chronic kidney disease: Secondary | ICD-10-CM

## 2022-02-08 DIAGNOSIS — G44229 Chronic tension-type headache, not intractable: Secondary | ICD-10-CM | POA: Diagnosis not present

## 2022-02-08 DIAGNOSIS — N1832 Chronic kidney disease, stage 3b: Secondary | ICD-10-CM | POA: Insufficient documentation

## 2022-02-08 DIAGNOSIS — I5022 Chronic systolic (congestive) heart failure: Secondary | ICD-10-CM

## 2022-02-08 LAB — POCT GLYCOSYLATED HEMOGLOBIN (HGB A1C)
Est. average glucose Bld gHb Est-mCnc: 235
Hemoglobin A1C: 9.8 % — AB (ref 4.0–5.6)

## 2022-02-08 MED ORDER — TRULICITY 3 MG/0.5ML ~~LOC~~ SOAJ
3.0000 mg | SUBCUTANEOUS | 3 refills | Status: DC
  Filled 2022-02-08: qty 6, 84d supply, fill #0

## 2022-02-08 MED ORDER — FLUOXETINE HCL 20 MG PO CAPS
20.0000 mg | ORAL_CAPSULE | Freq: Every day | ORAL | 3 refills | Status: DC
  Filled 2022-02-08: qty 90, 90d supply, fill #0

## 2022-02-08 MED ORDER — TRAZODONE HCL 100 MG PO TABS
100.0000 mg | ORAL_TABLET | Freq: Every day | ORAL | 11 refills | Status: DC
  Filled 2022-02-08: qty 30, 30d supply, fill #0

## 2022-02-08 MED ORDER — BUTALBITAL-APAP-CAFFEINE 50-325-40 MG PO TABS
1.0000 | ORAL_TABLET | Freq: Four times a day (QID) | ORAL | 5 refills | Status: DC | PRN
  Filled 2022-02-08: qty 14, 4d supply, fill #0

## 2022-02-10 LAB — COMPREHENSIVE METABOLIC PANEL
ALT: 26 IU/L (ref 0–44)
AST: 21 IU/L (ref 0–40)
Albumin/Globulin Ratio: 1.7 (ref 1.2–2.2)
Albumin: 5.2 g/dL — ABNORMAL HIGH (ref 3.8–4.9)
Alkaline Phosphatase: 71 IU/L (ref 44–121)
BUN/Creatinine Ratio: 18 (ref 9–20)
BUN: 26 mg/dL — ABNORMAL HIGH (ref 6–24)
Bilirubin Total: 1.2 mg/dL (ref 0.0–1.2)
CO2: 23 mmol/L (ref 20–29)
Calcium: 10.8 mg/dL — ABNORMAL HIGH (ref 8.7–10.2)
Chloride: 99 mmol/L (ref 96–106)
Creatinine, Ser: 1.46 mg/dL — ABNORMAL HIGH (ref 0.76–1.27)
Globulin, Total: 3 g/dL (ref 1.5–4.5)
Glucose: 238 mg/dL — ABNORMAL HIGH (ref 70–99)
Potassium: 5 mmol/L (ref 3.5–5.2)
Sodium: 142 mmol/L (ref 134–144)
Total Protein: 8.2 g/dL (ref 6.0–8.5)
eGFR: 57 mL/min/{1.73_m2} — ABNORMAL LOW (ref >59)

## 2022-02-10 LAB — MICROALBUMIN / CREATININE URINE RATIO
Creatinine, Urine: 13.1 mg/dL
Microalb/Creat Ratio: 49 mg/g creat — ABNORMAL HIGH (ref 0–29)
Microalbumin, Urine: 6.4 ug/mL

## 2022-02-14 ENCOUNTER — Encounter: Payer: Self-pay | Admitting: Nurse Practitioner

## 2022-02-14 ENCOUNTER — Ambulatory Visit (INDEPENDENT_AMBULATORY_CARE_PROVIDER_SITE_OTHER): Payer: Self-pay | Admitting: Nurse Practitioner

## 2022-02-14 ENCOUNTER — Other Ambulatory Visit: Payer: Self-pay

## 2022-02-14 VITALS — BP 120/70 | HR 83 | Ht 72.0 in | Wt 239.0 lb

## 2022-02-14 DIAGNOSIS — I10 Essential (primary) hypertension: Secondary | ICD-10-CM | POA: Diagnosis not present

## 2022-02-14 DIAGNOSIS — R42 Dizziness and giddiness: Secondary | ICD-10-CM

## 2022-02-14 DIAGNOSIS — E1169 Type 2 diabetes mellitus with other specified complication: Secondary | ICD-10-CM | POA: Diagnosis not present

## 2022-02-14 DIAGNOSIS — I2510 Atherosclerotic heart disease of native coronary artery without angina pectoris: Secondary | ICD-10-CM

## 2022-02-14 DIAGNOSIS — N1831 Chronic kidney disease, stage 3a: Secondary | ICD-10-CM

## 2022-02-14 DIAGNOSIS — I428 Other cardiomyopathies: Secondary | ICD-10-CM | POA: Diagnosis not present

## 2022-02-14 DIAGNOSIS — E785 Hyperlipidemia, unspecified: Secondary | ICD-10-CM

## 2022-02-14 DIAGNOSIS — I5022 Chronic systolic (congestive) heart failure: Secondary | ICD-10-CM

## 2022-02-14 DIAGNOSIS — I951 Orthostatic hypotension: Secondary | ICD-10-CM

## 2022-02-14 DIAGNOSIS — G4733 Obstructive sleep apnea (adult) (pediatric): Secondary | ICD-10-CM

## 2022-02-14 MED ORDER — MECLIZINE HCL 25 MG PO TABS
ORAL_TABLET | Freq: Three times a day (TID) | ORAL | 1 refills | Status: DC
  Filled 2022-02-14: qty 90, 30d supply, fill #0

## 2022-02-14 MED ORDER — CARVEDILOL 3.125 MG PO TABS
3.1250 mg | ORAL_TABLET | Freq: Two times a day (BID) | ORAL | 3 refills | Status: DC
  Filled 2022-02-14 – 2022-03-28 (×2): qty 60, 30d supply, fill #0

## 2022-02-14 NOTE — Progress Notes (Signed)
Office Visit    Patient Name: Gabriel [name]. Date of Encounter: 02/14/2022  Primary Care Provider:  [name], FNP Primary Cardiologist:  [name], MD Primary Electrophysiologist: None  Chief Complaint    Gabriel Hawks Isai Gottlieb. is a 51 y.o. male with PMH of CAD, HFrEF, chronic chest pain, mixed ischemic and nonischemic cardiomyopathy, nonsustained VT, stage III CKD, COPD, chronic anticoagulation secondary to PE, type 2 diabetes, presents today for follow-up of HFrEF.  Past Medical History    Past Medical History:  Diagnosis Date   CAD (coronary artery disease)    a. 04/2015 low risk MV;  b. 12/2016 Cath: minor irregs in LAD/Diag/LCX/OM, RCA 40p/m/d; c. 05/2020 Cath: LM nl, LAD 50d, LCX 30p, OM1 40, RCA 100p w/ L->R collats. CO/CI 3.1/1.3-->Med rx.   Chest wall pain, chronic    Chronic Troponin Elevation    CKD (chronic kidney disease), stage II-III    COPD (chronic obstructive pulmonary disease) (HCC)    Diabetes mellitus without complication ([place])    HFrEF (heart failure with reduced ejection fraction) ([place])    a. 03/2015 Echo: EF 45-50%; b. 12/2015 Echo: EF 20-25%; c. 02/2016 Echo: EF 30-35%; d. 11/2016 Echo: EF 40-45%; e. 06/2019 Echo: EF 30-35%; f. 11/2019 Echo: EF 25-30%; g. 05/2020 Echo: EF 35-40%; h. 05/2021 Echo: EF 40-45%; i. 11/2021 Echo: EF 20-25%, glob HK, mild LVH, GrIII DD, sev red RV fxn, mild MR.   Hypertension    Mitral regurgitation    Mild to moderate by October 2021 echocardiogram.   Mixed Ischemic & NICM (nonischemic cardiomyopathy) ([place])    a. 03/2015 Echo: EF 45-50%; b. 12/2015 Echo: EF 20-25%; c. 02/2016 Echo: EF 30-35%; d. 11/2016 Echo: EF 40-45%; e. 06/2019 Echo: EF 30-35%; f. 11/2019 Echo: EF 25-30%; g. 05/2020 Echo: EF 35-40%; h. 05/2021 Echo: EF 40-45%; i. 11/2021 Echo: EF 20-25%   Myocardial infarct (HCC)    NSVT (nonsustained ventricular tachycardia) ([place])    a. 12/2015 noted on tele-->amio;  b. 12/2015 Event monitor: no VT noted.   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  12/31/19: RUL and RLL PEs.   Syncope    a. 01/2016 - felt to be vasovagal.   Past Surgical History:  Procedure Laterality Date   AMPUTATION     CARDIAC CATHETERIZATION     FINGER AMPUTATION     Traumatic   FINGER FRACTURE SURGERY Left    LEFT HEART CATH AND CORONARY ANGIOGRAPHY N/A 01/06/2017   Procedure: Left Heart Cath and Coronary Angiography;  Surgeon: Wellington Hampshire, MD;  Location: [facility];  Service: Cardiovascular;  Laterality: N/A;   RIGHT/LEFT HEART CATH AND CORONARY ANGIOGRAPHY N/A 06/13/2020   Procedure: RIGHT/LEFT HEART CATH AND CORONARY ANGIOGRAPHY;  Surgeon: Nelva Bush, MD;  Location: [facility];  Service: Cardiovascular;  Laterality: N/A;    Allergies  Allergies  Allergen Reactions   Metformin And Related Nausea And Vomiting   Prednisone Other (See Comments)    Reaction: Hallucinations     Zantac [Ranitidine Hcl] Diarrhea and Nausea Only    Night sweats    History of Present Illness    Gabriel Drew Jamire Shabazz. is a 51 year old male with the above-mentioned past medical history who presents today for follow-up of recent hospitalization and HFrEF.  Gabriel [name] has had multiple hospitalizations for management of chronic HFrEF.  Stress test performed in 2016  was nonischemic and patient was admitted 05/2020 for complaint of chest pain and elevated troponin.  Diagnostic LHC performed revealed chronic total occluded RCA with left-to-right collaterals.  He was noted to otherwise have nonobstructive CAD with 30% distal stenosis of the LAD, 30% proximal stenosis in the left circumflex and 40% OM1 lesion.  Medical management was recommended and follow-up 2D echo in 05/2021 showed EF of 40-45%.  He was readmitted 09/2021 secondary to CHF and had subsequent readmission in April for related diagnoses.  During admission patient responded well to IV diuresis  and was discharged home at his request on torsemide 40 mg twice daily.  It was noted that patient's creatinine was trending upward at 2.08 prior to discharge and torsemide was reduced to 60 mg daily.  He was noted to be euvolemic at clinic follow-up on 4/20 and was last seen by cardiology on 5/30 when he had complaints of chronic chest pain which was unchanged.  He was also noted to be volume overloaded and torsemide was increased to 80 mg twice daily x3 days plan to decrease to 40 mg.  Patient noted stabilization of weight few days later and was feeling better.  Patient was admitted to the ED on 6 1 following complaint of N/V and fatigue with elevated CBG.  CBGs in the ED were noted to be 536 and troponins were elevated at 56 with trends of 51>52>51 and these were consistent with chronic elevations similar to 06/2019.  He also noted left-sided chest discomfort that was reproducible with palpitations and felt to be chronic in nature.  He was treated with IV Lasix, potassium and his Lantus was also increased.  He was discharged home 2 days later with instruction to follow-up with PCP and to record CBGs twice daily.  He was seen in follow-up at CHF clinic on 01/21/2022 with ongoing complaint of of minimal fatigue and dull constant headache.   Since last being seen in the office patient reports that he is still experiencing nausea and dizziness that increases with standing.  He denies any fluctuations in his blood sugars and states that he has maintained good hydration and p.o. intake over the past few weeks. He is euvolemic on examination today and reports compliance with his medication regimen.  Patient recently followed up with his PCP and was started on medication for anxiety and medication for headaches.  Patient denies chest pain, palpitations, dyspnea, PND, orthopnea, nausea, vomiting, dizziness, syncope, edema, weight gain, or early satiety.   Home Medications    Current Outpatient Medications   Medication Sig Dispense Refill   butalbital-acetaminophen-caffeine (FIORICET) 50-325-40 MG tablet Take 1 tablet by mouth every 6 (six) hours as needed for headache. 14 tablet 5   dapagliflozin propanediol (FARXIGA) 10 MG TABS tablet Take 1 tablet (10 mg total) by mouth daily. 90 tablet 3   Dulaglutide (TRULICITY) 3 LE/7.5TZ SOPN Inject 3 mg as directed once a week. 6 mL 3   ezetimibe (ZETIA) 10 MG tablet Take 1 tablet (10 mg total) by mouth daily. 90 tablet 3   fenofibrate (TRICOR) 145 MG tablet Take 1 tablet (145 mg total) by mouth daily. 90 tablet 3   FLUoxetine (PROZAC) 20 MG capsule Take 1 capsule (20 mg total) by mouth daily. 90 capsule 3   insulin aspart (NOVOLOG) 100 UNIT/ML FlexPen Inject 3 units into the skin before breakfast and lunch and 5 units before dinner. 45 mL 0   Insulin Glargine (BASAGLAR KWIKPEN) 100 UNIT/ML Inject 36 Units into the skin  at bedtime. 30 mL 3   Insulin Pen Needle (PEN NEEDLES) 32G X 4 MM MISC 200 each by Does not apply route at bedtime. 200 each 3   Ipratropium-Albuterol (COMBIVENT RESPIMAT) 20-100 MCG/ACT AERS respimat Inhale 1 puff into the lungs every 6 (six) hours as needed for wheezing.     meclizine (ANTIVERT) 25 MG tablet Take by mouth 3 (three) times daily. 90 tablet 1   naproxen (NAPROSYN) 500 MG tablet Take 1 tablet (500 mg total) by mouth daily as needed. 30 tablet 5   nitroGLYCERIN (NITROSTAT) 0.4 MG SL tablet Place 1 tablet (0.4 mg total) under the tongue every 5 (five) minutes x 3 doses as needed for chest pain. 30 tablet 0   potassium chloride SA (KLOR-CON M20) 20 MEQ tablet Take 1 tablet daily 90 tablet 3   rivaroxaban (XARELTO) 20 MG TABS tablet Take 1 tablet (20 mg total) by mouth daily with supper. 90 tablet 3   rosuvastatin (CRESTOR) 20 MG tablet Take 1 tablet (20 mg total) by mouth daily. 90 tablet 3   sacubitril-valsartan (ENTRESTO) 24-26 MG Take 1 tablet by mouth 2 (two) times daily. 180 tablet 3   sildenafil (VIAGRA) 100 MG tablet Take 1  tablets 1 hour prior to intercourse. 10 tablet 0   spironolactone (ALDACTONE) 25 MG tablet Take 1 tablet (25 mg total) by mouth daily. 90 tablet 3   torsemide (DEMADEX) 20 MG tablet Take 2 tablets (40 mg total) by mouth 2 (two) times daily. 400 tablet 3   traZODone (DESYREL) 100 MG tablet Take 1 tablet (100 mg total) by mouth at bedtime. 30 tablet 11   carvedilol (COREG) 3.125 MG tablet Take 1 tablet (3.125 mg total) by mouth 2 (two) times daily with a meal. 60 tablet 3   No current facility-administered medications for this visit.     Review of Systems  Please see the history of present illness.    (+) Dizziness (+) Headaches  All other systems reviewed and are otherwise negative except as noted above.  Physical Exam    Wt Readings from Last 3 Encounters:  02/14/22 239 lb (108.4 kg)  02/08/22 240 lb 3.2 oz (109 kg)  01/21/22 235 lb 2 oz (106.7 kg)   VS: Vitals:   02/14/22 1438  BP: 120/70  Pulse: 83  SpO2: 97%  ,Body mass index is 32.41 kg/m.  -Orthostatic vital signs with lying blood pressure 130/80 and heart rate of 80 -Patient reported swimmy headed and blood pressure 112/76 with a heart rate of 92  Constitutional:      Appearance: Healthy appearance. Not in distress.  Neck:     Vascular: JVD normal.  Pulmonary:     Effort: Pulmonary effort is normal.     Breath sounds: No wheezing. No rales. Diminished in the bases Cardiovascular:     Normal rate. Regular rhythm. Normal S1. Normal S2.      Murmurs: There is no murmur.  Edema:    Peripheral edema absent.  Abdominal:     Palpations: Abdomen is soft non tender. There is no hepatomegaly.  Skin:    General: Skin is warm and dry.  Neurological:     General: No focal deficit present.     Mental Status: Alert and oriented to person, place and time.     Cranial Nerves: Cranial nerves are intact.  EKG/LABS/Other Studies Reviewed    ECG personally reviewed by me today -normal sinus rhythm with rate of 83 and no  acute changes  compared to previous EKG  Lab Results  Component Value Date   WBC 7.8 01/19/2022   HGB 15.1 01/19/2022   HCT 45.3 01/19/2022   MCV 82.1 01/19/2022   PLT 132 (L) 01/19/2022   Lab Results  Component Value Date   CREATININE 1.46 (H) 02/08/2022   BUN 26 (H) 02/08/2022   NA 142 02/08/2022   K 5.0 02/08/2022   CL 99 02/08/2022   CO2 23 02/08/2022   Lab Results  Component Value Date   ALT 26 02/08/2022   AST 21 02/08/2022   ALKPHOS 71 02/08/2022   BILITOT 1.2 02/08/2022   Lab Results  Component Value Date   CHOL 110 11/25/2021   HDL 17 (L) 11/25/2021   LDLCALC 62 11/25/2021   LDLDIRECT 60.6 11/23/2020   TRIG 153 (H) 11/25/2021   CHOLHDL 6.5 11/25/2021    Lab Results  Component Value Date   HGBA1C 9.8 (A) 02/08/2022    Assessment & Plan    1.  HFrEF/mixed ICM and NICM: -Patient appears euvolemic on examination today.   -2D echo performed with reduced LV function and EF of 30-35% We discussed the results of his previous 2D echo and he is in agreement with referral to EP for consideration of ICD for primary prevention -Continue current GDMT with Coreg, spironolactone, Entresto, and Farxiga -Continue torsemide 20 mg twice daily  2.  CAD: -s/p CTO of RCA with nonobstructive CAD and chronic angina. -Patient denies any anginal symptoms today but has endorsed dizziness with standing and nausea. -He has positive orthostatics with 18 point change from lying to sitting -We will decrease his carvedilol to 3.125 twice daily and we will have him hold his Entresto, spironolactone, and Coreg dose tonight and in the morning if dizziness is still present -He was advised to seek care at the ED if he feels that his dizziness and nausea has not improved and is getting worse  3.  HTN/orthostatic hypotension: -Blood pressure today was 120/70 -Patient will hold his antihypertensive regimen as noted above if he experiences continued orthostasis and dizziness while -We will  readdress additional adjustment to his current antihypertensive regimen and follow-up in 2 weeks if symptoms persist  4.  HLD: -Last LDL was 62 on 11/2021 -Continue current statin regimen as prescribed by PCP  5.  CKD stage III: -Most recent creatinine at PCP was 1.46 which is most improved from 2.06 in the ED  6.  Vertigo: -Patient had complaints of increased dizziness with side-to-side head movement while lying and sitting -We will prescribe meclizine 25 mg 3 times daily as needed   Disposition: Follow-up with [name], MD or APP in 2 weeks   Medication Adjustments/Labs and Tests Ordered: Current medicines are reviewed at length with the patient today.  Concerns regarding medicines are outlined above.   Signed, Mable Fill, Marissa Nestle, NP 02/14/2022, 5:04 PM [place]

## 2022-02-14 NOTE — Patient Instructions (Signed)
Medication Instructions:   Your physician has recommended you make the following change in your medication:    DECREASE your Carvedilol (Coreg) to 3.125 MG twice a day.  2.    START taking Meclizine 25 MG three times a day as needed for vertigo.  HOLD your Carvedilol,  Entresto, and Torsemide tonight and also tomorrow morning (if you are still feeling poorly)  *If you need a refill on your cardiac medications before your next appointment, please call your pharmacy*   Follow-Up: At [facility], you and your health needs are our priority.  As part of our continuing mission to provide you with exceptional heart care, we have created designated Provider Care Teams.  These Care Teams include your primary Cardiologist (physician) and Advanced Practice Providers (APPs -  Physician Assistants and Nurse Practitioners) who all work together to provide you with the care you need, when you need it.  We recommend signing up for the patient portal called "MyChart".  Sign up information is provided on this After Visit Summary.  MyChart is used to connect with patients for Virtual Visits (Telemedicine).  Patients are able to view lab/test results, encounter notes, upcoming appointments, etc.  Non-urgent messages can be sent to your provider as well.   To learn more about what you can do with MyChart, go to [email or web address].    Your next appointment:   2 week(s)  The format for your next appointment:   In Person  Provider:   You may see [name], MD or one of the following Advanced Practice Providers on your designated Care Team:   Murray Hodgkins, NP Cadence Kathlen Mody, [place]    Other Instructions   Important Information About Sugar

## 2022-02-22 ENCOUNTER — Ambulatory Visit: Payer: BC Managed Care – PPO | Admitting: Nurse Practitioner

## 2022-03-01 ENCOUNTER — Emergency Department: Payer: Medicaid Other

## 2022-03-01 ENCOUNTER — Other Ambulatory Visit: Payer: Self-pay

## 2022-03-01 ENCOUNTER — Emergency Department
Admission: EM | Admit: 2022-03-01 | Discharge: 2022-03-02 | Discharge status: Against Medical Advice | Payer: Medicaid Other | Attending: Emergency Medicine | Admitting: Emergency Medicine

## 2022-03-01 DIAGNOSIS — N183 Chronic kidney disease, stage 3 unspecified: Secondary | ICD-10-CM | POA: Diagnosis not present

## 2022-03-01 DIAGNOSIS — U071 COVID-19: Secondary | ICD-10-CM | POA: Diagnosis not present

## 2022-03-01 DIAGNOSIS — B349 Viral infection, unspecified: Secondary | ICD-10-CM | POA: Insufficient documentation

## 2022-03-01 DIAGNOSIS — R0602 Shortness of breath: Secondary | ICD-10-CM | POA: Diagnosis not present

## 2022-03-01 DIAGNOSIS — Z7901 Long term (current) use of anticoagulants: Secondary | ICD-10-CM | POA: Insufficient documentation

## 2022-03-01 DIAGNOSIS — I129 Hypertensive chronic kidney disease with stage 1 through stage 4 chronic kidney disease, or unspecified chronic kidney disease: Secondary | ICD-10-CM | POA: Insufficient documentation

## 2022-03-01 DIAGNOSIS — Z8616 Personal history of COVID-19: Secondary | ICD-10-CM

## 2022-03-01 DIAGNOSIS — I2510 Atherosclerotic heart disease of native coronary artery without angina pectoris: Secondary | ICD-10-CM | POA: Insufficient documentation

## 2022-03-01 DIAGNOSIS — Z79899 Other long term (current) drug therapy: Secondary | ICD-10-CM | POA: Diagnosis not present

## 2022-03-01 DIAGNOSIS — E1122 Type 2 diabetes mellitus with diabetic chronic kidney disease: Secondary | ICD-10-CM | POA: Diagnosis not present

## 2022-03-01 DIAGNOSIS — R0600 Dyspnea, unspecified: Secondary | ICD-10-CM | POA: Diagnosis not present

## 2022-03-01 DIAGNOSIS — R778 Other specified abnormalities of plasma proteins: Secondary | ICD-10-CM | POA: Diagnosis not present

## 2022-03-01 HISTORY — DX: Personal history of COVID-19: Z86.16

## 2022-03-01 LAB — TROPONIN I (HIGH SENSITIVITY)
Troponin I (High Sensitivity): 39 ng/L — ABNORMAL HIGH (ref <18)
Troponin I (High Sensitivity): 45 ng/L — ABNORMAL HIGH (ref <18)

## 2022-03-01 LAB — BASIC METABOLIC PANEL
Anion gap: 9 (ref 5–15)
BUN: 25 mg/dL — ABNORMAL HIGH (ref 6–20)
CO2: 17 mmol/L — ABNORMAL LOW (ref 22–32)
Calcium: 9 mg/dL (ref 8.9–10.3)
Chloride: 108 mmol/L (ref 98–111)
Creatinine, Ser: 1.77 mg/dL — ABNORMAL HIGH (ref 0.61–1.24)
GFR, Estimated: 45 mL/min — ABNORMAL LOW (ref >60)
Glucose, Bld: 328 mg/dL — ABNORMAL HIGH (ref 70–99)
Potassium: 3.3 mmol/L — ABNORMAL LOW (ref 3.5–5.1)
Sodium: 134 mmol/L — ABNORMAL LOW (ref 135–145)

## 2022-03-01 LAB — CBC WITH DIFFERENTIAL/PLATELET
Abs Immature Granulocytes: 0.02 10*3/uL (ref 0.00–0.07)
Basophils Absolute: 0 10*3/uL (ref 0.0–0.1)
Basophils Relative: 1 %
Eosinophils Absolute: 0 10*3/uL (ref 0.0–0.5)
Eosinophils Relative: 0 %
HCT: 42.7 % (ref 39.0–52.0)
Hemoglobin: 14.8 g/dL (ref 13.0–17.0)
Immature Granulocytes: 0 %
Lymphocytes Relative: 17 %
Lymphs Abs: 1 10*3/uL (ref 0.7–4.0)
MCH: 28.8 pg (ref 26.0–34.0)
MCHC: 34.7 g/dL (ref 30.0–36.0)
MCV: 83.2 fL (ref 80.0–100.0)
Monocytes Absolute: 0.6 10*3/uL (ref 0.1–1.0)
Monocytes Relative: 11 %
Neutro Abs: 4 10*3/uL (ref 1.7–7.7)
Neutrophils Relative %: 71 %
Platelets: 128 10*3/uL — ABNORMAL LOW (ref 150–400)
RBC: 5.13 MIL/uL (ref 4.22–5.81)
RDW: 15 % (ref 11.5–15.5)
WBC: 5.6 10*3/uL (ref 4.0–10.5)
nRBC: 0 % (ref 0.0–0.2)

## 2022-03-01 MED ORDER — DEXAMETHASONE 1 MG/ML PO CONC
8.0000 mg | Freq: Once | ORAL | Status: DC
  Filled 2022-03-01: qty 8

## 2022-03-01 MED ORDER — CARVEDILOL 3.125 MG PO TABS
3.1250 mg | ORAL_TABLET | ORAL | Status: AC
  Administered 2022-03-01: 3.125 mg via ORAL
  Filled 2022-03-01: qty 1

## 2022-03-01 MED ORDER — RIVAROXABAN 20 MG PO TABS
20.0000 mg | ORAL_TABLET | Freq: Every day | ORAL | Status: DC
Start: 2022-03-01 — End: 2022-03-02
  Administered 2022-03-01: 20 mg via ORAL
  Filled 2022-03-01: qty 1

## 2022-03-01 MED ORDER — IPRATROPIUM-ALBUTEROL 0.5-2.5 (3) MG/3ML IN SOLN
3.0000 mL | Freq: Once | RESPIRATORY_TRACT | Status: AC
  Administered 2022-03-01: 3 mL via RESPIRATORY_TRACT
  Filled 2022-03-01: qty 3

## 2022-03-01 MED ORDER — LIDOCAINE VISCOUS HCL 2 % MT SOLN
15.0000 mL | Freq: Once | OROMUCOSAL | Status: AC
  Administered 2022-03-01: 15 mL via OROMUCOSAL
  Filled 2022-03-01: qty 15

## 2022-03-01 MED ORDER — TORSEMIDE 20 MG PO TABS
20.0000 mg | ORAL_TABLET | Freq: Every day | ORAL | Status: DC
  Administered 2022-03-01: 20 mg via ORAL
  Filled 2022-03-01: qty 1

## 2022-03-01 MED ORDER — IPRATROPIUM-ALBUTEROL 0.5-2.5 (3) MG/3ML IN SOLN
3.0000 mL | Freq: Once | RESPIRATORY_TRACT | Status: DC
  Filled 2022-03-01: qty 3

## 2022-03-01 NOTE — ED Triage Notes (Signed)
Patient to ER via [facility] with complaints of cough, congestion, and sore throat since Thursday. Reports being unable to take his prescription medications because it hurts to swallow.

## 2022-03-01 NOTE — ED Provider Triage Note (Signed)
Emergency Medicine Provider Triage Evaluation Note  Gabriel [name]. , a 51 y.o. male  was evaluated in triage.  Pt complains of cough, congestion, sore throat, and shortness of breath. Symptoms started 2 days ago.    Physical Exam  There were no vitals taken for this visit. Gen:   Awake, no distress   Resp:  Normal effort  MSK:   Moves extremities without difficulty  Other:    Medical Decision Making  Medically screening exam initiated at 4:36 PM.  Appropriate orders placed.  [name] was informed that the remainder of the evaluation will be completed by another provider, this initial triage assessment does not replace that evaluation, and the importance of remaining in the ED until their evaluation is complete.   Victorino Dike, FNP 03/01/22 1844

## 2022-03-01 NOTE — ED Provider Notes (Signed)
[facility] Provider Note    Event Date/Time   First MD Initiated Contact with Patient 03/01/22 2013     (approximate)   History   Nasal Congestion   HPI  Gabriel [name]. is a 51 y.o. male with history of heart failure with reduced ejection fraction COPD, CKD, type 2 diabetes, MI presents to the emergency department for treatment and evaluation of cough, congestion, sore throat for the past 2 days.  Due to his sore throat, he has been unable to take his medications.  He does not feel this is a CHF exacerbation.  He feels as though he may have pneumonia or some other URI.  Past Medical History:  Diagnosis Date   CAD (coronary artery disease)    a. 04/2015 low risk MV;  b. 12/2016 Cath: minor irregs in LAD/Diag/LCX/OM, RCA 40p/m/d; c. 05/2020 Cath: LM nl, LAD 50d, LCX 30p, OM1 40, RCA 100p w/ L->R collats. CO/CI 3.1/1.3-->Med rx.   Chest wall pain, chronic    Chronic Troponin Elevation    CKD (chronic kidney disease), stage II-III    COPD (chronic obstructive pulmonary disease) (HCC)    Diabetes mellitus without complication ([place])    HFrEF (heart failure with reduced ejection fraction) ([place])    a. 03/2015 Echo: EF 45-50%; b. 12/2015 Echo: EF 20-25%; c. 02/2016 Echo: EF 30-35%; d. 11/2016 Echo: EF 40-45%; e. 06/2019 Echo: EF 30-35%; f. 11/2019 Echo: EF 25-30%; g. 05/2020 Echo: EF 35-40%; h. 05/2021 Echo: EF 40-45%; i. 11/2021 Echo: EF 20-25%, glob HK, mild LVH, GrIII DD, sev red RV fxn, mild MR.   Hypertension    Mitral regurgitation    Mild to moderate by October 2021 echocardiogram.   Mixed Ischemic & NICM (nonischemic cardiomyopathy) ([place])    a. 03/2015 Echo: EF 45-50%; b. 12/2015 Echo: EF 20-25%; c. 02/2016 Echo: EF 30-35%; d. 11/2016 Echo: EF 40-45%; e. 06/2019 Echo: EF 30-35%; f. 11/2019 Echo: EF 25-30%; g. 05/2020 Echo: EF 35-40%; h. 05/2021 Echo: EF 40-45%; i. 11/2021 Echo: EF 20-25%   Myocardial infarct (HCC)    NSVT (nonsustained ventricular tachycardia) ([place])     a. 12/2015 noted on tele-->amio;  b. 12/2015 Event monitor: no VT noted.   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  12/31/19: RUL and RLL PEs.   Syncope    a. 01/2016 - felt to be vasovagal.     Physical Exam   Triage Vital Signs: ED Triage Vitals  Enc Vitals Group     BP 03/01/22 1635 131/70     Pulse Rate 03/01/22 1635 (!) 106     Resp 03/01/22 1635 (!) 22     Temp 03/01/22 1635 98.3 F (36.8 C)     Temp Source 03/01/22 1635 Oral     SpO2 03/01/22 1635 99 %     Weight --      Height 03/01/22 1636 6' (1.829 m)     Head Circumference --      Peak Flow --      Pain Score 03/01/22 1636 6     Pain Loc --      Pain Edu? --      Excl. in [place]? --     Most recent vital signs: Vitals:   03/01/22 2200 03/01/22 2230  BP: 131/76 (!) 145/70  Pulse: 86 91  Resp: (!) 26 (!) 25  Temp:    SpO2:  99% 98%    General: Awake, no distress.  CV:  Good peripheral perfusion.  Resp:  Normal effort. Faint wheezing in lower lobe. Abd:  No distention.  Other:  Throat erythematous.   ED Results / Procedures / Treatments   Labs (all labs ordered are listed, but only abnormal results are displayed) Labs Reviewed  CBC WITH DIFFERENTIAL/PLATELET - Abnormal; Notable for the following components:      Result Value   Platelets 128 (*)    All other components within normal limits  BASIC METABOLIC PANEL - Abnormal; Notable for the following components:   Sodium 134 (*)    Potassium 3.3 (*)    CO2 17 (*)    Glucose, Bld 328 (*)    BUN 25 (*)    Creatinine, Ser 1.77 (*)    GFR, Estimated 45 (*)    All other components within normal limits  TROPONIN I (HIGH SENSITIVITY) - Abnormal; Notable for the following components:   Troponin I (High Sensitivity) 39 (*)    All other components within normal limits  TROPONIN I (HIGH SENSITIVITY) - Abnormal; Notable for the following components:   Troponin I (High Sensitivity)  45 (*)    All other components within normal limits  RESP PANEL BY RT-PCR (FLU A&B, COVID) [id number]     EKG  Sinus tachycardia with a rate of 107 with PACs.    RADIOLOGY  No active cardiopulmonary abnormalities.  I have independently reviewed and interpreted imaging as well as reviewed report from radiology.  PROCEDURES:  Critical Care performed: No  Procedures   MEDICATIONS ORDERED IN ED:  Medications  torsemide (DEMADEX) tablet 20 mg (20 mg Oral Given 03/01/22 2132)  rivaroxaban (XARELTO) tablet 20 mg (20 mg Oral Given 03/01/22 2132)  dexamethasone (DECADRON) 1 MG/ML solution 8 mg (has no administration in time range)  ipratropium-albuterol (DUONEB) 0.5-2.5 (3) MG/3ML nebulizer solution 3 mL (has no administration in time range)  lidocaine (XYLOCAINE) 2 % viscous mouth solution 15 mL (15 mLs Mouth/Throat Given 03/01/22 2121)  carvedilol (COREG) tablet 3.125 mg (3.125 mg Oral Given 03/01/22 2132)  ipratropium-albuterol (DUONEB) 0.5-2.5 (3) MG/3ML nebulizer solution 3 mL (3 mLs Nebulization Given 03/01/22 2123)     IMPRESSION / MDM / ASSESSMENT AND PLAN / ED COURSE   I reviewed the triage vital signs and the nursing notes.  Differential diagnosis includes, but is not limited to: COVID, influenza, CHF exacerbation, cardiac event  Patient's presentation is most consistent with acute presentation with potential threat to life or bodily function.  51 year old male presenting to the emergency department for treatment and evaluation of sore throat, chest congestion, cough.  Patient states he feels like he has pneumonia.  He states that due to his sore throat, he has been unable to take his medications. Heart rate of 106 likely related as he has not had his beta blocker or other medications.   Serial troponins are elevated at 39 and 45, however the patient has a history of chronically elevated troponin.  BUN and creatinine are at baseline.  CBC unremarkable.  COVID swab is pending  and has been since 438.  Initially labs stated that they could not find the specimen and the nurse had started to go recollected, however the lab now indicates that they have found it and will now run it.  11:52pm Care handed off to Dr. Charna Archer     FINAL CLINICAL IMPRESSION(S) / ED DIAGNOSES   Final diagnoses:  Acute viral syndrome  Rx / DC Orders   ED Discharge Orders     None        Note:  This document was prepared using Dragon voice recognition software and may include unintentional dictation errors.   Victorino Dike, FNP 03/01/22 2352    Rada Hay, MD 03/02/22 0004

## 2022-03-02 LAB — RESP PANEL BY RT-PCR (FLU A&B, COVID) ARPGX2
Influenza A by PCR: NEGATIVE
Influenza B by PCR: NEGATIVE
SARS Coronavirus 2 by RT PCR: POSITIVE — AB

## 2022-03-02 MED ORDER — ALBUTEROL SULFATE HFA 108 (90 BASE) MCG/ACT IN AERS: 4.0000 | INHALATION_SPRAY | Freq: Once | RESPIRATORY_TRACT | Status: DC

## 2022-03-02 NOTE — ED Notes (Signed)
Patient called for RN to come to room. RN informed pt she had pulled medication for him and would be in momentarily. Upon entering room patient found sitting at end of bed with noted SOB and waving L arm in RN face yelling for RN to remove IV. RN asked what she could do to assist the patient and patient states "you can't do anything for me is what the doctor said so I'm going home." RN attempted to explain to patient that we are unfortunately still waiting for results of his respiratory swab. Pt repeats that the provider "just told me we can't do anything for you." RN again attempted to explain plan of care which includes medications administered prior to this RN and awaiting official swab results. RN also attempted to explain pathology and standard treatment for viral respiratory illnesses and that RN actively had steroid medication and breathing treatment in her hand ready to administer in an effort to relax pt airway and improve breathing. RN also attempted to reinforce to patient that antibiotics cannot be administered without evidence to provider of bacterial infection vs viral. Pt refuses to let RN speak and refuses all teaching. Pt then began yelling at RN about not receiving a bill for treatment and "you better make sure I don't receive a bill." RN informed patient that billing is not under nursing scope of practice. RN attempted to get patient to sign AMA form for leaving prior to completing prescribed treatment. Pt again waved arm directly in RN face. IV removed and patient ambulated from department with independent and steady gait. Pt speaking loudly in full complete sentences at time of AMA.

## 2022-03-02 NOTE — ED Provider Notes (Signed)
-----------------------------------------   12:23 AM on 03/02/2022 ----------------------------------------- Patient assessed in conjunction with NP Triplett for cough, congestion, sore throat, postnasal drip, and difficulty breathing for the past couple of days.  Chest x-ray is unremarkable with no evidence of CHF or pneumonia, however patient is mildly tachypneic with wheezing on my assessment.  I explained to patient that antibiotics are not indicated in the setting of suspected viral illness, but we can manage element of COPD exacerbation with dose of steroids and breathing treatment.  Patient apparently became upset following our discussion and eloped from the emergency department before I was able to speak with him further.  Testing for COVID-19 came back positive just after patient eloped from the emergency department.   Blake Divine, MD 03/02/22 Laureen Abrahams

## 2022-03-05 ENCOUNTER — Telehealth: Payer: Self-pay

## 2022-03-05 ENCOUNTER — Other Ambulatory Visit: Payer: Self-pay

## 2022-03-05 NOTE — Telephone Encounter (Signed)
Spoke with patient and advised of positive covid result.

## 2022-03-26 ENCOUNTER — Other Ambulatory Visit: Payer: Self-pay

## 2022-03-26 ENCOUNTER — Ambulatory Visit: Payer: Medicaid Other | Admitting: Family Medicine

## 2022-03-26 ENCOUNTER — Encounter: Payer: Self-pay | Admitting: Family Medicine

## 2022-03-26 ENCOUNTER — Telehealth: Payer: Self-pay

## 2022-03-26 ENCOUNTER — Ambulatory Visit: Payer: Self-pay | Admitting: *Deleted

## 2022-03-26 VITALS — BP 130/89 | HR 100 | Temp 98.5°F | Resp 17 | Ht 72.0 in | Wt 239.0 lb

## 2022-03-26 DIAGNOSIS — E1122 Type 2 diabetes mellitus with diabetic chronic kidney disease: Secondary | ICD-10-CM | POA: Diagnosis not present

## 2022-03-26 DIAGNOSIS — I2510 Atherosclerotic heart disease of native coronary artery without angina pectoris: Secondary | ICD-10-CM | POA: Diagnosis not present

## 2022-03-26 DIAGNOSIS — I509 Heart failure, unspecified: Secondary | ICD-10-CM | POA: Diagnosis not present

## 2022-03-26 DIAGNOSIS — E785 Hyperlipidemia, unspecified: Secondary | ICD-10-CM | POA: Diagnosis not present

## 2022-03-26 DIAGNOSIS — R109 Unspecified abdominal pain: Secondary | ICD-10-CM | POA: Insufficient documentation

## 2022-03-26 DIAGNOSIS — R809 Proteinuria, unspecified: Secondary | ICD-10-CM | POA: Insufficient documentation

## 2022-03-26 DIAGNOSIS — Z596 Low income: Secondary | ICD-10-CM | POA: Diagnosis not present

## 2022-03-26 DIAGNOSIS — J449 Chronic obstructive pulmonary disease, unspecified: Secondary | ICD-10-CM | POA: Diagnosis not present

## 2022-03-26 DIAGNOSIS — N179 Acute kidney failure, unspecified: Secondary | ICD-10-CM | POA: Insufficient documentation

## 2022-03-26 DIAGNOSIS — L409 Psoriasis, unspecified: Secondary | ICD-10-CM | POA: Diagnosis not present

## 2022-03-26 DIAGNOSIS — N1832 Chronic kidney disease, stage 3b: Secondary | ICD-10-CM | POA: Diagnosis not present

## 2022-03-26 DIAGNOSIS — I10 Essential (primary) hypertension: Secondary | ICD-10-CM | POA: Diagnosis not present

## 2022-03-26 DIAGNOSIS — Z5986 Financial insecurity: Secondary | ICD-10-CM | POA: Insufficient documentation

## 2022-03-26 DIAGNOSIS — E668 Other obesity: Secondary | ICD-10-CM | POA: Diagnosis not present

## 2022-03-26 MED ORDER — SUCRALFATE 1 G PO TABS: 1.0000 g | ORAL_TABLET | Freq: Three times a day (TID) | ORAL | 0 refills | Status: DC

## 2022-03-26 NOTE — Telephone Encounter (Signed)
   Telephone encounter was:  Successful.  03/26/2022 Name: Gabriel [name]. MRN: [id number] DOB: September 15, 1970  Gabriel [name]. is a 51 y.o. year old male who is a primary care patient of [name], FNP . The community resource team was consulted for assistance with  RX  Care guide performed the following interventions: Patient provided with information about care guide support team and interviewed to confirm resource needs. Patient needs  blood pressure medicine that he cant afford to buy Follow Up Plan:  No further follow up planned at this time. The patient has been provided with needed resources.   [place], Care Management  [phone] [address], [place], [place] [address] Phone: [phone] Email: [email or web address]

## 2022-03-26 NOTE — Assessment & Plan Note (Signed)
Chronic, worsening Stat referral to CCM  FYI to Cards/HF teams/CCM pharmD

## 2022-03-26 NOTE — Assessment & Plan Note (Signed)
Acute on chronic, reports allergy concern to milk, regardless of lactose presence Endorses diarrhea and belching with milk intake- both milk and ice cream; denies with cheese Allergy testing today Referral to GI for concern for gastroparesis $7 good rx rate for trial of carafate; printed Rx provided as pt believes he won't have the cash to pick up

## 2022-03-26 NOTE — Patient Outreach (Signed)
  Care Coordination   Screening  Visit Note   03/26/2022 Name: Gabriel [name]. MRN: [id number] DOB: 10-Aug-1971  Gabriel [name]. is a 51 y.o. year old male who sees [name], FNP for primary care. I spoke with  Gabriel [name]. by phone today  What matters to the patients health and wellness today?  RX    Goals Addressed   None     SDOH assessments and interventions completed:  No{THN Tip this will not be part of the note when signed-REQUIRED REPORT FIELD DO NOT DELETE (Optional):27901}     Care Coordination Interventions Activated:  Yes {THN Tip this will not be part of the note when signed-REQUIRED REPORT FIELD DO NOT DELETE (Optional):27901} Care Coordination Interventions:  Yes, provided {THN Tip this will not be part of the note when signed-REQUIRED REPORT FIELD DO NOT DELETE (Optional):27901}  Follow up plan:  Patient to be referred to     Encounter Outcome:  Pt. Visit Completed {THN Tip this will not be part of the note when signed-REQUIRED REPORT FIELD DO NOT DELETE (Optional):27901}

## 2022-03-26 NOTE — Progress Notes (Signed)
Established patient visit   Patient: Gabriel [name].   DOB: [birth year]   51 y.o. Male  MRN: [id number] Visit Date: 03/26/2022  Today's healthcare provider: [name], FNP  Introduced to nurse practitioner role and practice setting.  All questions answered.  Discussed provider/patient relationship and expectations.  I,Tiffany J Bragg,acting as a scribe for [name], FNP.,have documented all relevant documentation on the behalf of [name], FNP,as directed by  [name], FNP while in the presence of [name], FNP.   Chief Complaint  Patient presents with   Abdominal Pain    Patient complains of abdominal pain, diarrhea and burping that tastes like spoiled milk, only when he drinks milk. States he has had stomach issues for years but within the last week has had issues with milk specifically.    Subjective    HPI HPI     Abdominal Pain    Additional comments: Patient complains of abdominal pain, diarrhea and burping that tastes like spoiled milk, only when he drinks milk. States he has had stomach issues for years but within the last week has had issues with milk specifically.       Last edited by Smitty Knudsen, CMA on 03/26/2022 10:30 AM.      Medications: Outpatient Medications Prior to Visit  Medication Sig   butalbital-acetaminophen-caffeine (FIORICET) 50-325-40 MG tablet Take 1 tablet by mouth every 6 (six) hours as needed for headache.   carvedilol (COREG) 3.125 MG tablet Take 1 tablet (3.125 mg total) by mouth 2 (two) times daily with a meal.   dapagliflozin propanediol (FARXIGA) 10 MG TABS tablet Take 1 tablet (10 mg total) by mouth daily.   Dulaglutide (TRULICITY) 3 VX/4.8AX SOPN Inject 3 mg as directed once a week.   ezetimibe (ZETIA) 10 MG tablet Take 1 tablet (10 mg total) by mouth daily.   fenofibrate (TRICOR) 145 MG tablet Take 1 tablet (145 mg total) by mouth daily.   FLUoxetine (PROZAC) 20 MG capsule Take 1 capsule (20 mg total)  by mouth daily.   insulin aspart (NOVOLOG) 100 UNIT/ML FlexPen Inject 3 units into the skin before breakfast and lunch and 5 units before dinner.   Insulin Glargine (BASAGLAR KWIKPEN) 100 UNIT/ML Inject 36 Units into the skin at bedtime.   Insulin Pen Needle (PEN NEEDLES) 32G X 4 MM MISC 200 each by Does not apply route at bedtime.   Ipratropium-Albuterol (COMBIVENT RESPIMAT) 20-100 MCG/ACT AERS respimat Inhale 1 puff into the lungs every 6 (six) hours as needed for wheezing.   meclizine (ANTIVERT) 25 MG tablet Take by mouth 3 (three) times daily.   naproxen (NAPROSYN) 500 MG tablet Take 1 tablet (500 mg total) by mouth daily as needed.   nitroGLYCERIN (NITROSTAT) 0.4 MG SL tablet Place 1 tablet (0.4 mg total) under the tongue every 5 (five) minutes x 3 doses as needed for chest pain.   potassium chloride SA (KLOR-CON M20) 20 MEQ tablet Take 1 tablet daily   rivaroxaban (XARELTO) 20 MG TABS tablet Take 1 tablet (20 mg total) by mouth daily with supper.   rosuvastatin (CRESTOR) 20 MG tablet Take 1 tablet (20 mg total) by mouth daily.   sacubitril-valsartan (ENTRESTO) 24-26 MG Take 1 tablet by mouth 2 (two) times daily.   sildenafil (VIAGRA) 100 MG tablet Take 1 tablets 1 hour prior to intercourse.   spironolactone (ALDACTONE) 25 MG tablet Take 1 tablet (25 mg total) by mouth daily.  torsemide (DEMADEX) 20 MG tablet Take 2 tablets (40 mg total) by mouth 2 (two) times daily.   traZODone (DESYREL) 100 MG tablet Take 1 tablet (100 mg total) by mouth at bedtime.   No facility-administered medications prior to visit.    Review of Systems  Last CBC Lab Results  Component Value Date   WBC 5.6 03/01/2022   HGB 14.8 03/01/2022   HCT 42.7 03/01/2022   MCV 83.2 03/01/2022   MCH 28.8 03/01/2022   RDW 15.0 03/01/2022   PLT 128 (L) 71/69/6789   Last metabolic panel Lab Results  Component Value Date   GLUCOSE 328 (H) 03/01/2022   NA 134 (L) 03/01/2022   K 3.3 (L) 03/01/2022   CL 108 03/01/2022    CO2 17 (L) 03/01/2022   BUN 25 (H) 03/01/2022   CREATININE 1.77 (H) 03/01/2022   GFRNONAA 45 (L) 03/01/2022   CALCIUM 9.0 03/01/2022   PROT 8.2 02/08/2022   ALBUMIN 5.2 (H) 02/08/2022   LABGLOB 3.0 02/08/2022   AGRATIO 1.7 02/08/2022   BILITOT 1.2 02/08/2022   ALKPHOS 71 02/08/2022   AST 21 02/08/2022   ALT 26 02/08/2022   ANIONGAP 9 03/01/2022   Last lipids Lab Results  Component Value Date   CHOL 110 11/25/2021   HDL 17 (L) 11/25/2021   LDLCALC 62 11/25/2021   LDLDIRECT 60.6 11/23/2020   TRIG 153 (H) 11/25/2021   CHOLHDL 6.5 11/25/2021   Last hemoglobin A1c Lab Results  Component Value Date   HGBA1C 9.8 (A) 02/08/2022   Last thyroid functions Lab Results  Component Value Date   TSH 1.270 11/05/2021      Objective    BP 130/89 (BP Location: Right Arm, Patient Position: Sitting, Cuff Size: Normal)   Pulse 100   Temp 98.5 F (36.9 C) (Oral)   Resp 17   Ht 6' (1.829 m)   Wt 239 lb (108.4 kg)   SpO2 99%   BMI 32.41 kg/m   BP Readings from Last 3 Encounters:  03/26/22 130/89  03/01/22 (!) 145/70  02/14/22 120/70   Wt Readings from Last 3 Encounters:  03/26/22 239 lb (108.4 kg)  02/14/22 239 lb (108.4 kg)  02/08/22 240 lb 3.2 oz (109 kg)   SpO2 Readings from Last 3 Encounters:  03/26/22 99%  03/01/22 98%  02/14/22 97%      Physical Exam Vitals and nursing note reviewed.  Constitutional:      Appearance: Normal appearance. He is obese.  HENT:     Head: Normocephalic and atraumatic.  Eyes:     Pupils: Pupils are equal, round, and reactive to light.  Cardiovascular:     Rate and Rhythm: Normal rate and regular rhythm.     Pulses: Normal pulses.     Heart sounds: No murmur heard.    No friction rub.  Pulmonary:     Effort: Pulmonary effort is normal.     Breath sounds: Normal breath sounds.  Abdominal:     General: Bowel sounds are normal. There is distension.     Palpations: Abdomen is soft.     Tenderness: There is generalized abdominal  tenderness. There is no guarding or rebound.     Hernia: No hernia is present.  Musculoskeletal:        General: Normal range of motion.     Cervical back: Normal range of motion.  Skin:    General: Skin is warm and dry.     Capillary Refill: Capillary refill takes less than  2 seconds.  Neurological:     General: No focal deficit present.     Mental Status: He is alert and oriented to person, place, and time. Mental status is at baseline.  Psychiatric:        Mood and Affect: Mood is depressed. Mood is not anxious.      No results found for any visits on 03/26/22.  Assessment & Plan     Problem List Items Addressed This Visit       Other   Abdominal pain - Primary    Acute on chronic, reports allergy concern to milk, regardless of lactose presence Endorses diarrhea and belching with milk intake- both milk and ice cream; denies with cheese Allergy testing today Referral to GI for concern for gastroparesis $7 good rx rate for trial of carafate; printed Rx provided as pt believes he won't have the cash to pick up      Relevant Medications   sucralfate (CARAFATE) 1 g tablet   Other Relevant Orders   IgE Milk w/ Component Reflex   Ambulatory referral to Gastroenterology   Patient cannot afford medications    Chronic, worsening Stat referral to CCM  FYI to Cards/HF teams/CCM pharmD      Relevant Orders   AMB Referral to [facility]     Return if symptoms worsen or fail to improve.      Vonna Kotyk, FNP, have reviewed all documentation for this visit. The documentation on 03/26/22 for the exam, diagnosis, procedures, and orders are all accurate and complete.    [name], [place] [phone] (phone) [phone] (fax)  [place]

## 2022-03-27 ENCOUNTER — Other Ambulatory Visit: Payer: Self-pay | Admitting: Nephrology

## 2022-03-27 DIAGNOSIS — N1832 Chronic kidney disease, stage 3b: Secondary | ICD-10-CM

## 2022-03-27 DIAGNOSIS — E1122 Type 2 diabetes mellitus with diabetic chronic kidney disease: Secondary | ICD-10-CM

## 2022-03-27 DIAGNOSIS — E785 Hyperlipidemia, unspecified: Secondary | ICD-10-CM

## 2022-03-28 ENCOUNTER — Telehealth: Payer: Self-pay

## 2022-03-28 ENCOUNTER — Other Ambulatory Visit: Payer: Self-pay | Admitting: Pharmacy Technician

## 2022-03-28 ENCOUNTER — Other Ambulatory Visit: Payer: Self-pay

## 2022-03-28 MED ORDER — SUCRALFATE 1 G PO TABS
ORAL_TABLET | ORAL | 0 refills | Status: DC
  Filled 2022-03-28: qty 120, 30d supply, fill #0

## 2022-03-28 NOTE — Patient Outreach (Addendum)
Patient presented to [facility] on 03/27/22 to pick-up medications.  Patient stated that he did not have insurance.  Insurance check discovered that patient has Medicaid.with prescription drug coverage with [employer].  When asked about the Medicaid, patient denied having it.  Called [employer] spoke with [place].  Gerda Diss verified that patient's prescription drug coverage is still active.  Made patient aware that [employer] stated that he has coverage through a managed care plan.  Patient stated that he has Medicaid and presented his Medicaid card.  Patient stated that he could not afford the co-pays for his medication.  Requested that the medications be provided free of charge.  Explained that free medications were only provided to uninsured patients.  Patient stated that [facility].  Explained that [facility] closed on 01/04/22 and services were moved to the [facility],  However, he could not obtain his medications for free since he has prescription drug coverage with Medicaid.  Asked patient if there was a friend or family member who could help him with his copays and he stated that his father is on a fixed income and could not.  Patient stated that since he could not obtain his medications that he would just die and left angry.  Patient returned to [facility] on 03/28/22 to pick-up medications.  Stated that Larena Sox from [facility] was going to cover the cost of his copays.  Unable to reach [place] by phone to verify.  Patient paid copays and received medications.  After patient had left, spoke with Larena Sox at [facility].  Levada Dy stated that she had made a referral to the embedded coordinator team at [facility] and made patient aware that someone would be contacting him to discuss if assistance with the copays would be available through  [facility].  Jacquelynn Cree Patient Advocate Specialist [place]

## 2022-03-28 NOTE — Telephone Encounter (Signed)
   Telephone encounter was:  Successful.  03/28/2022 Name: [name] MRN: [id number] DOB: 1970-11-15  [name] is a 51 y.o. year old male who is a primary care patient of [name], FNP . The community resource team was consulted for assistance with  RX  Care guide performed the following interventions: Patient provided with information about care guide support team and interviewed to confirm resource needs. FOLLOW UP CALL  Follow Up Plan:  No further follow up planned at this time. The patient has been provided with needed resources.    [place], Care Management  [phone] [address], [place], [place] [address] Phone: [phone] Email: [email or web address]

## 2022-03-29 ENCOUNTER — Other Ambulatory Visit: Payer: Self-pay | Admitting: Pharmacist

## 2022-03-29 LAB — IGE MILK W/ COMPONENT REFLEX: F002-IgE Milk: <0.1 kU/L

## 2022-03-29 NOTE — Patient Instructions (Signed)
Taydon,   It was great talking with you today!  I'll talk with:  1) Your cardiology team about streamlining your cholesterol regimen 2) [name] about whether she wants you on spironolactone right now or not 3) The pharmacy about setting up a charge account that you can pay as able.   I'll let you know when I hear anything on the above.   Thanks!  Catie Hedwig Morton, PharmD, [facility] [phone]

## 2022-03-29 NOTE — Chronic Care Management (AMB) (Signed)
Chief Complaint  Patient presents with   Hypertension   Diabetes    Gabriel [name]. is a 51 y.o. year old male who presented for a telephone visit.   They were referred to the pharmacist by their PCP for assistance in managing medication access and complex medication management.   Patient is participating in a Managed Medicaid Plan:  Yes  Subjective:  Care Team: Primary Care Provider: [name], FNP ; Next Scheduled Visit: 05/09/22 Cardiologist: EP - Quentin Ore; Primary - [place]; HF - [place]; Next Scheduled Visit: EP 04/17/22; HF 04/23/22  Nephrology: Lanora Manis; Next Scheduled Visit: 04/30/22  Medication Access/Adherence  Current Pharmacy:  [facility] [place] [address] Phone: [phone] Fax: [phone]   Patient reports affordability concerns with their medications: Yes  - reports that although he has Managed Medicaid, he cannot afford due to the volume of copays Patient reports access/transportation concerns to their pharmacy: No  Patient reports adherence concerns with their medications:  Yes     Diabetes:  Current medications: Farxiga 10 mg daily, Trulicity 3 mg weekly (has a 3 month supply currently); Basaglar 36 units daily, Humalog 5 units three times daily with meals (also reports he has Novolog at home) Medications tried in the past: metformin - eGFR not appropriate for reinitiation  Hyperlipidemia/ASCVD Risk Reduction  Current lipid lowering medications: rosuvastatin 20 mg daily, ezetimibe 10 mg daily, fenofibrate 145 mg daily  Antiplatelet regimen: none due to DOAC  Heart Failure:  Current medications:  ACEi/ARB/ARNI: Entresto 24/26 mg twice daily (PAP for now) SGLT2i: Farxiga 10 mg daily  Beta blocker: carvedilol 3.125 mg twice daily Mineralocorticoid Receptor Antagonist: spironolactone 25 mg daily prescribed but patient does not have Diuretic regimen: torsemide 40 mg twice daily + potassium 20  mEq daily  Depression/Anxiety:  Current medications: fluoxetine 20 mg daily, trazodone 100  mg QPM - not taking  Abdominal Upset: Current medications: carafate 1 g four times daily prescribed   Health Maintenance  Health Maintenance Due  Topic Date Due   COVID-19 Vaccine (1) Never done   OPHTHALMOLOGY EXAM  Never done   TETANUS/TDAP  Never done   Zoster Vaccines- Shingrix (1 of 2) Never done   FOOT EXAM  03/02/2021   INFLUENZA VACCINE  03/19/2022     Objective: Lab Results  Component Value Date   HGBA1C 9.8 (A) 02/08/2022    Lab Results  Component Value Date   CREATININE 1.77 (H) 03/01/2022   BUN 25 (H) 03/01/2022   NA 134 (L) 03/01/2022   K 3.3 (L) 03/01/2022   CL 108 03/01/2022   CO2 17 (L) 03/01/2022    Lab Results  Component Value Date   CHOL 110 11/25/2021   HDL 17 (L) 11/25/2021   LDLCALC 62 11/25/2021   LDLDIRECT 60.6 11/23/2020   TRIG 153 (H) 11/25/2021   CHOLHDL 6.5 11/25/2021    Medications Reviewed Today     Reviewed by Osker Mason, RPH-CPP (Pharmacist) on 03/29/22 at 1218  Med List Status: <None>   Medication Order Taking? Sig Documenting Provider Last Dose Status Informant  butalbital-acetaminophen-caffeine (FIORICET) 50-325-40 MG tablet [id number] No Take 1 tablet by mouth every 6 (six) hours as needed for headache.  Patient not taking: Reported on 03/29/2022   [name], FNP Not Taking Active   carvedilol (COREG) 3.125 MG tablet [id number] Yes Take 1 tablet (3.125 mg total) by mouth 2 (two) times daily with a meal. Theora Gianotti, NP  Taking Active   dapagliflozin propanediol (FARXIGA) 10 MG TABS tablet [id number] No Take 1 tablet (10 mg total) by mouth daily.  Patient not taking: Reported on 03/29/2022   Alisa Graff, FNP Not Taking Active   Dulaglutide (TRULICITY) 3 KG/8.8PJ SOPN [id number] Yes Inject 3 mg as directed once a week. [name], FNP Taking Active   ezetimibe (ZETIA) 10 MG tablet [id number] Yes Take 1  tablet (10 mg total) by mouth daily. Alisa Graff, FNP Taking Active   fenofibrate (TRICOR) 145 MG tablet [id number] Yes Take 1 tablet (145 mg total) by mouth daily. Alisa Graff, FNP Taking Active   FLUoxetine (PROZAC) 20 MG capsule [id number] Yes Take 1 capsule (20 mg total) by mouth daily. [name], FNP Taking Active   insulin aspart (NOVOLOG) 100 UNIT/ML FlexPen [id number] Yes Inject 3 units into the skin before breakfast and lunch and 5 units before dinner. Swayze, Ava, DO Taking Active   Insulin Glargine (BASAGLAR KWIKPEN) 100 UNIT/ML [id number] Yes Inject 36 Units into the skin at bedtime. Swayze, Ava, DO Taking Active     Discontinued 01/21/22 1612 (Change in therapy)            Med Note Janan Ridge   Thu Feb 14, 2022  2:37 PM)    Insulin Pen Needle (PEN NEEDLES) 32G X 4 MM MISC [id number] Yes 200 each by Does not apply route at bedtime. [name], FNP Taking Active Self  Ipratropium-Albuterol (COMBIVENT RESPIMAT) 20-100 MCG/ACT AERS respimat [id number] No Inhale 1 puff into the lungs every 6 (six) hours as needed for wheezing.  Patient not taking: Reported on 03/29/2022   [provider] Not Taking Active Self  meclizine (ANTIVERT) 25 MG tablet [id number]  Take by mouth 3 (three) times daily. Theora Gianotti, NP  Active   naproxen (NAPROSYN) 500 MG tablet [id number]  Take 1 tablet (500 mg total) by mouth daily as needed. [name] A, FNP  Active   nitroGLYCERIN (NITROSTAT) 0.4 MG SL tablet [id number]  Place 1 tablet (0.4 mg total) under the tongue every 5 (five) minutes x 3 doses as needed for chest pain. Lorella Nimrod, MD  Active Self  potassium chloride SA (KLOR-CON M20) 20 MEQ tablet [id number] Yes Take 1 tablet daily Alisa Graff, FNP Taking Active   rivaroxaban (XARELTO) 20 MG TABS tablet [id number] Yes Take 1 tablet (20 mg total) by mouth daily with supper. Alisa Graff, FNP Taking Active   rosuvastatin (CRESTOR) 20 MG tablet [id number] Yes  Take 1 tablet (20 mg total) by mouth daily. Alisa Graff, FNP Taking Active   sacubitril-valsartan (ENTRESTO) 24-26 MG [id number] Yes Take 1 tablet by mouth 2 (two) times daily. Alisa Graff, FNP Taking Active   sildenafil (VIAGRA) 100 MG tablet [id number]  Take 1 tablets 1 hour prior to intercourse. Abbie Sons, MD  Active Self  spironolactone (ALDACTONE) 25 MG tablet [id number] No Take 1 tablet (25 mg total) by mouth daily.  Patient not taking: Reported on 03/29/2022   Alisa Graff, FNP Not Taking Active   sucralfate (CARAFATE) 1 g tablet [id number] Yes TAKE 1 TABLET BY MOUTH 4 TIMES DAILY WITH MEALS AND AT BEDTIME  Taking Active   torsemide (DEMADEX) 20 MG tablet [id number] Yes Take 2 tablets (40 mg total) by mouth 2 (two) times daily. Alisa Graff, FNP Taking Active            Med Note Bubba Hales, [place]  Thu Feb 14, 2022  2:37 PM)    traZODone (DESYREL) 100 MG tablet [id number] No Take 1 tablet (100 mg total) by mouth at bedtime.  Patient not taking: Reported on 03/29/2022   [name], FNP Not Taking Active               Assessment/Plan:   Diabetes: - Currently uncontrolled - Reviewed long term cardiovascular and renal outcomes of uncontrolled blood sugar - Reviewed goal A1c, goal fasting, and goal 2 hour post prandial glucose - Moving forward, consider changing Trulicity to Ozempic (also covered by Medicaid) for greater potency, which should allow for reduced insulin burden, potential for weight loss. Unclear on his plan whether [employer] requires 1 or 2 fails of other incretin agents for approval.  - Moving forward, consider pursuing [place] for DM copay assistance (when current approval for patient assistance expires) if available to [facility] beneficiaries.    Hyperlipidemia/ASCVD Risk Reduction: - Currently controlled but with opportunity for optimization - Will collaborate with cardiology and PCP. To reduce pill burden/copay, consider  maximizing rosuvastatin and stopping ezetimibe. Pending continued improvement in glycemic control, will evaluate need for fenofibrate moving forward.   Heart Failure: - Currently with opportunity for optimization - Unclear whether patient is to be on spironolactone at this time. Would likely allow for reduced doses of torsemide, which likely will be beneficial from a renal perspective. Will collaborate with [place].   - [employer] for HF copay support is closed at this time. Will follow for re-opening  Depression/Anxiety: - Continue current regimen at this time  Abdominal Pain: - Counseled on need to separate sucralfate from other medications to reduce risk of impacted absorption and efficacy.    Will discuss possibility of setting up charge account at [facility] while patient is waiting on disability to be approved.   Follow Up Plan: Phone call in 2 weeks  Catie Hedwig Morton, PharmD, [facility] [phone]

## 2022-03-29 NOTE — Progress Notes (Signed)
Negative milk allergy on lab findings.  Gabriel [name], [name] [place], [place] [address] [phone] (phone) [phone] (fax) [place]

## 2022-04-01 ENCOUNTER — Other Ambulatory Visit: Payer: Self-pay | Admitting: Pharmacist

## 2022-04-01 ENCOUNTER — Ambulatory Visit: Payer: Medicaid Other

## 2022-04-01 ENCOUNTER — Other Ambulatory Visit: Payer: Self-pay

## 2022-04-01 DIAGNOSIS — E1169 Type 2 diabetes mellitus with other specified complication: Secondary | ICD-10-CM

## 2022-04-01 DIAGNOSIS — I5022 Chronic systolic (congestive) heart failure: Secondary | ICD-10-CM

## 2022-04-01 MED ORDER — SPIRONOLACTONE 25 MG PO TABS
25.0000 mg | ORAL_TABLET | Freq: Every day | ORAL | 3 refills | Status: DC
  Filled 2022-04-01: qty 90, 90d supply, fill #0

## 2022-04-01 MED ORDER — ROSUVASTATIN CALCIUM 40 MG PO TABS
40.0000 mg | ORAL_TABLET | Freq: Every day | ORAL | 3 refills | Status: DC
  Filled 2022-04-01: qty 90, 90d supply, fill #0

## 2022-04-01 MED ORDER — SPIRONOLACTONE 25 MG PO TABS
12.5000 mg | ORAL_TABLET | Freq: Every day | ORAL | 3 refills | Status: DC
  Filled 2022-04-01 – 2022-04-05 (×3): qty 45, 90d supply, fill #0

## 2022-04-01 NOTE — Patient Instructions (Addendum)
Jakaleb,   1) Continue rosuvastatin 20 mg daily + ezetimibe 10 mg daily this month. When you finish that supply in ~30 days, you will instead increase rosuvastatin to 40 mg and stop ezetimibe. We went ahead and sent in the prescription for the 40 mg dose, but you do not need to fill it until you finish what you have.   2) Re-start spironolactone 12.5 mg daily. Pick this up from [facility] on Friday. When you see Dr. Quentin Ore or [name], they will recheck your kidney function and potassium and adjust your regimen as needed.   3) I will talk to Tally Joe about changing Trulicity to something stronger when you finish the supply.   Thanks!  Catie Hedwig Morton, PharmD, [facility] [phone]

## 2022-04-01 NOTE — Chronic Care Management (AMB) (Signed)
Chief Complaint  Patient presents with   Diabetes   Hypertension    Gabriel [name]. is a 51 y.o. year old male who presented for a telephone visit.   They were referred to the pharmacist by their PCP for assistance in managing complex medication management.   Patient is participating in a Managed Medicaid Plan:  Yes  Subjective:  Care Team: Primary Care Provider: [name], FNP ; Next Scheduled Visit: 05/09/22 Cardiologist: EP - Quentin Ore; Primary - [place]; HF - [place]; Next Scheduled Visit: EP 04/17/22; HF 04/23/22  Nephrology: Lanora Manis; Next Scheduled Visit: 04/30/22  Medication Access/Adherence  Current Pharmacy:  [facility] [address] [place] [address] Phone: [phone] Fax: [phone]   Patient reports affordability concerns with their medications: Yes  Patient reports access/transportation concerns to their pharmacy: No  Patient reports adherence concerns with their medications:  No     Diabetes:   Current medications: Farxiga 10 mg daily, Trulicity 3 mg weekly; Basaglar 36 units daily, Humalog 5 units three times daily with meals (also reports he has Novolog at home) Medications tried in the past: metformin - eGFR not appropriate for reinitiation  Previously discussed optimization to Ozempic instead of Trulicity for greater potency. Patient notes he has ~ 4 weeks of Trulicity remaining. Goal to minimize insulin as able.    Hyperlipidemia/ASCVD Risk Reduction   Current lipid lowering medications: rosuvastatin 20 mg daily, ezetimibe 10 mg daily, fenofibrate 145 mg daily   Antiplatelet regimen: none due to DOAC  [name] in agreement with stopping ezetimibe, increasing rosuvastatin to 40 mg daily.    Heart Failure:   Current medications:  ACEi/ARB/ARNI: Entresto 24/26 mg twice daily (PAP for now) SGLT2i: Farxiga 10 mg daily  Beta blocker: carvedilol 3.125 mg twice daily Mineralocorticoid Receptor  Antagonist: spironolactone 25 mg daily prescribed - [name] recommended reinitiation with follow up labs with upcoming cardiology appointments.  Diuretic regimen: torsemide 40 mg twice daily + potassium 20 mEq daily   Health Maintenance  Health Maintenance Due  Topic Date Due   COVID-19 Vaccine (1) Never done   OPHTHALMOLOGY EXAM  Never done   TETANUS/TDAP  Never done   Zoster Vaccines- Shingrix (1 of 2) Never done   FOOT EXAM  03/02/2021   INFLUENZA VACCINE  03/19/2022     Objective: Lab Results  Component Value Date   HGBA1C 9.8 (A) 02/08/2022    Lab Results  Component Value Date   CREATININE 1.77 (H) 03/01/2022   BUN 25 (H) 03/01/2022   NA 134 (L) 03/01/2022   K 3.3 (L) 03/01/2022   CL 108 03/01/2022   CO2 17 (L) 03/01/2022    Lab Results  Component Value Date   CHOL 110 11/25/2021   HDL 17 (L) 11/25/2021   LDLCALC 62 11/25/2021   LDLDIRECT 60.6 11/23/2020   TRIG 153 (H) 11/25/2021   CHOLHDL 6.5 11/25/2021    Medications Reviewed Today     Reviewed by Osker Mason, RPH-CPP (Pharmacist) on 03/29/22 at 1218  Med List Status: <None>   Medication Order Taking? Sig Documenting Provider Last Dose Status Informant  butalbital-acetaminophen-caffeine (FIORICET) 50-325-40 MG tablet [id number] No Take 1 tablet by mouth every 6 (six) hours as needed for headache.  Patient not taking: Reported on 03/29/2022   [name], FNP Not Taking Active   carvedilol (COREG) 3.125 MG tablet [id number] Yes Take 1 tablet (3.125 mg total) by mouth 2 (two) times daily with a meal.  Theora Gianotti, NP Taking Active   dapagliflozin propanediol (FARXIGA) 10 MG TABS tablet [id number] No Take 1 tablet (10 mg total) by mouth daily.  Patient not taking: Reported on 03/29/2022   Alisa Graff, FNP Not Taking Active   Dulaglutide (TRULICITY) 3 AS/5.0NL SOPN [id number] Yes Inject 3 mg as directed once a week. [name], FNP Taking Active   ezetimibe (ZETIA) 10 MG tablet  [id number] Yes Take 1 tablet (10 mg total) by mouth daily. Alisa Graff, FNP Taking Active   fenofibrate (TRICOR) 145 MG tablet [id number] Yes Take 1 tablet (145 mg total) by mouth daily. Alisa Graff, FNP Taking Active   FLUoxetine (PROZAC) 20 MG capsule [id number] Yes Take 1 capsule (20 mg total) by mouth daily. [name], FNP Taking Active   insulin aspart (NOVOLOG) 100 UNIT/ML FlexPen [id number] Yes Inject 3 units into the skin before breakfast and lunch and 5 units before dinner. Swayze, Ava, DO Taking Active   Insulin Glargine (BASAGLAR KWIKPEN) 100 UNIT/ML [id number] Yes Inject 36 Units into the skin at bedtime. Swayze, Ava, DO Taking Active     Discontinued 01/21/22 1612 (Change in therapy)            Med Note Janan Ridge   Thu Feb 14, 2022  2:37 PM)    Insulin Pen Needle (PEN NEEDLES) 32G X 4 MM MISC [id number] Yes 200 each by Does not apply route at bedtime. [name], FNP Taking Active Self  Ipratropium-Albuterol (COMBIVENT RESPIMAT) 20-100 MCG/ACT AERS respimat [id number] No Inhale 1 puff into the lungs every 6 (six) hours as needed for wheezing.  Patient not taking: Reported on 03/29/2022   [provider] Not Taking Active Self  meclizine (ANTIVERT) 25 MG tablet [id number]  Take by mouth 3 (three) times daily. Theora Gianotti, NP  Active   naproxen (NAPROSYN) 500 MG tablet [id number]  Take 1 tablet (500 mg total) by mouth daily as needed. [name] A, FNP  Active   nitroGLYCERIN (NITROSTAT) 0.4 MG SL tablet [id number]  Place 1 tablet (0.4 mg total) under the tongue every 5 (five) minutes x 3 doses as needed for chest pain. Lorella Nimrod, MD  Active Self  potassium chloride SA (KLOR-CON M20) 20 MEQ tablet [id number] Yes Take 1 tablet daily Alisa Graff, FNP Taking Active   rivaroxaban (XARELTO) 20 MG TABS tablet [id number] Yes Take 1 tablet (20 mg total) by mouth daily with supper. Alisa Graff, FNP Taking Active   rosuvastatin (CRESTOR) 20 MG  tablet [id number] Yes Take 1 tablet (20 mg total) by mouth daily. Alisa Graff, FNP Taking Active   sacubitril-valsartan (ENTRESTO) 24-26 MG [id number] Yes Take 1 tablet by mouth 2 (two) times daily. Alisa Graff, FNP Taking Active   sildenafil (VIAGRA) 100 MG tablet [id number]  Take 1 tablets 1 hour prior to intercourse. Abbie Sons, MD  Active Self  spironolactone (ALDACTONE) 25 MG tablet [id number] No Take 1 tablet (25 mg total) by mouth daily.  Patient not taking: Reported on 03/29/2022   Alisa Graff, FNP Not Taking Active   sucralfate (CARAFATE) 1 g tablet [id number] Yes TAKE 1 TABLET BY MOUTH 4 TIMES DAILY WITH MEALS AND AT BEDTIME  Taking Active   torsemide (DEMADEX) 20 MG tablet [id number] Yes Take 2 tablets (40 mg total) by mouth 2 (two) times daily. Alisa Graff, FNP Taking Active            Med Note Bubba Hales,  Kennis Carina   Thu Feb 14, 2022  2:37 PM)    traZODone (DESYREL) 100 MG tablet [id number] No Take 1 tablet (100 mg total) by mouth at bedtime.  Patient not taking: Reported on 03/29/2022   [name], FNP Not Taking Active               Assessment/Plan:   Diabetes: - Opportunity for optimization  - Patient amenable to transitioning to more potent incretin agent. Will collaborate with PCP. Would recommend either Ozempic or try for [facility] coverage.    Hyperlipidemia/ASCVD Risk Reduction - Per [name], stop ezetimibe and increase rosuvastatin. Orders sent. Patient will complete current supply and then start rosuvastatin 40 mg daily.   Heart Failure: - Per [name], restart spironolactone. Patient will pick up when he is out for imaging on Friday. Reiterated importance of BMP when he has cardiology follow up in ~ 2 weeks  Follow Up Plan: phone call in 3 weeks  Catie Hedwig Morton, PharmD, [place] [phone]

## 2022-04-05 ENCOUNTER — Ambulatory Visit
Admission: RE | Admit: 2022-04-05 | Discharge: 2022-04-05 | Disposition: A | Discharge status: Home / Self Care | Payer: Medicaid Other | Source: Ambulatory Visit | Attending: Nephrology | Admitting: Nephrology

## 2022-04-05 ENCOUNTER — Other Ambulatory Visit: Payer: Self-pay

## 2022-04-05 DIAGNOSIS — E785 Hyperlipidemia, unspecified: Secondary | ICD-10-CM | POA: Diagnosis not present

## 2022-04-05 DIAGNOSIS — N1832 Chronic kidney disease, stage 3b: Secondary | ICD-10-CM | POA: Insufficient documentation

## 2022-04-05 DIAGNOSIS — E1122 Type 2 diabetes mellitus with diabetic chronic kidney disease: Secondary | ICD-10-CM | POA: Insufficient documentation

## 2022-04-05 DIAGNOSIS — N281 Cyst of kidney, acquired: Secondary | ICD-10-CM | POA: Diagnosis not present

## 2022-04-09 DIAGNOSIS — E119 Type 2 diabetes mellitus without complications: Secondary | ICD-10-CM | POA: Diagnosis not present

## 2022-04-09 LAB — HM DIABETES EYE EXAM

## 2022-04-16 NOTE — Progress Notes (Unsigned)
Electrophysiology Office Note:    Date:  04/17/2022   ID:  Gabriel [name]., DOB 21-Nov-1970, MRN [id number]  PCP:  [name], FNP  [facility] Cardiologist:  [name], MD  [facility] Electrophysiologist:  Vickie Epley, MD   Referring MD: Antony Madura, PA-C   Chief Complaint: Chronic systolic heart failure  History of Present Illness:    Gabriel [name]. is a 51 y.o. male who presents for an evaluation of chronic systolic heart failure at the request of Cadence Furth, PA-C. Their medical history includes coronary artery disease, chronic systolic heart failure, nonsustained ventricular tachycardia, CKD 3, COPD, pulmonary embolism on anticoagulation, diabetes and obesity.  The patient last saw Cadence in clinic Jan 15, 2022.  At that appointment a repeat echo was ordered to recheck his left ventricular function.  His diuretics were also increased.  He saw [name] in clinic on January 21, 2022.  At that appointment he reported class III symptoms.     Past Medical History:  Diagnosis Date   CAD (coronary artery disease)    a. 04/2015 low risk MV;  b. 12/2016 Cath: minor irregs in LAD/Diag/LCX/OM, RCA 40p/m/d; c. 05/2020 Cath: LM nl, LAD 50d, LCX 30p, OM1 40, RCA 100p w/ L->R collats. CO/CI 3.1/1.3-->Med rx.   Chest wall pain, chronic    Chronic Troponin Elevation    CKD (chronic kidney disease), stage II-III    COPD (chronic obstructive pulmonary disease) (HCC)    Diabetes mellitus without complication ([place])    HFrEF (heart failure with reduced ejection fraction) ([place])    a. 03/2015 Echo: EF 45-50%; b. 12/2015 Echo: EF 20-25%; c. 02/2016 Echo: EF 30-35%; d. 11/2016 Echo: EF 40-45%; e. 06/2019 Echo: EF 30-35%; f. 11/2019 Echo: EF 25-30%; g. 05/2020 Echo: EF 35-40%; h. 05/2021 Echo: EF 40-45%; i. 11/2021 Echo: EF 20-25%, glob HK, mild LVH, GrIII DD, sev red RV fxn, mild MR.   Hypertension    Mitral regurgitation    Mild to moderate by October 2021 echocardiogram.    Mixed Ischemic & NICM (nonischemic cardiomyopathy) ([place])    a. 03/2015 Echo: EF 45-50%; b. 12/2015 Echo: EF 20-25%; c. 02/2016 Echo: EF 30-35%; d. 11/2016 Echo: EF 40-45%; e. 06/2019 Echo: EF 30-35%; f. 11/2019 Echo: EF 25-30%; g. 05/2020 Echo: EF 35-40%; h. 05/2021 Echo: EF 40-45%; i. 11/2021 Echo: EF 20-25%   Myocardial infarct (HCC)    NSVT (nonsustained ventricular tachycardia) ([place])    a. 12/2015 noted on tele-->amio;  b. 12/2015 Event monitor: no VT noted.   Obesity (BMI 30.0-34.9)    Psoriasis    Recurrent pulmonary emboli ([place]) 06/07/2020   06/07/20: small bilateral PEs.  12/31/19: RUL and RLL PEs.   Syncope    a. 01/2016 - felt to be vasovagal.    Past Surgical History:  Procedure Laterality Date   AMPUTATION     CARDIAC CATHETERIZATION     FINGER AMPUTATION     Traumatic   FINGER FRACTURE SURGERY Left    LEFT HEART CATH AND CORONARY ANGIOGRAPHY N/A 01/06/2017   Procedure: Left Heart Cath and Coronary Angiography;  Surgeon: Wellington Hampshire, MD;  Location: [facility];  Service: Cardiovascular;  Laterality: N/A;   RIGHT/LEFT HEART CATH AND CORONARY ANGIOGRAPHY N/A 06/13/2020   Procedure: RIGHT/LEFT HEART CATH AND CORONARY ANGIOGRAPHY;  Surgeon: Nelva Bush, MD;  Location: [facility];  Service: Cardiovascular;  Laterality: N/A;    Current Medications: Current Meds  Medication Sig  carvedilol (COREG) 3.125 MG tablet Take 1 tablet (3.125 mg total) by mouth 2 (two) times daily with a meal.   dapagliflozin propanediol (FARXIGA) 10 MG TABS tablet Take 1 tablet (10 mg total) by mouth daily.   Dulaglutide (TRULICITY) 3 FF/6.3WG SOPN Inject 3 mg as directed once a week.   fenofibrate (TRICOR) 145 MG tablet Take 1 tablet (145 mg total) by mouth daily.   insulin aspart (NOVOLOG) 100 UNIT/ML FlexPen Inject 3 units into the skin before breakfast and lunch and 5 units before dinner.   Insulin Glargine (BASAGLAR KWIKPEN) 100 UNIT/ML Inject 36 Units into the skin at bedtime.    Insulin Pen Needle (PEN NEEDLES) 32G X 4 MM MISC 200 each by Does not apply route at bedtime.   Ipratropium-Albuterol (COMBIVENT RESPIMAT) 20-100 MCG/ACT AERS respimat Inhale 1 puff into the lungs every 6 (six) hours as needed for wheezing.   meclizine (ANTIVERT) 25 MG tablet Take by mouth 3 (three) times daily.   naproxen (NAPROSYN) 500 MG tablet Take 1 tablet (500 mg total) by mouth daily as needed.   nitroGLYCERIN (NITROSTAT) 0.4 MG SL tablet Place 1 tablet (0.4 mg total) under the tongue every 5 (five) minutes x 3 doses as needed for chest pain.   potassium chloride SA (KLOR-CON M20) 20 MEQ tablet Take 1 tablet daily   rivaroxaban (XARELTO) 20 MG TABS tablet Take 1 tablet (20 mg total) by mouth daily with supper.   rosuvastatin (CRESTOR) 40 MG tablet Take 1 tablet (40 mg total) by mouth daily.   sacubitril-valsartan (ENTRESTO) 24-26 MG Take 1 tablet by mouth 2 (two) times daily.   spironolactone (ALDACTONE) 25 MG tablet Take 0.5 tablets (12.5 mg total) by mouth daily.   sucralfate (CARAFATE) 1 g tablet TAKE 1 TABLET BY MOUTH 4 TIMES DAILY WITH MEALS AND AT BEDTIME   torsemide (DEMADEX) 20 MG tablet Take 2 tablets (40 mg total) by mouth 2 (two) times daily.   traZODone (DESYREL) 100 MG tablet Take 1 tablet (100 mg total) by mouth at bedtime.     Allergies:   Metformin and related, Prednisone, and Zantac [ranitidine hcl]   Social History   Socioeconomic History   Marital status: Widowed    Spouse name: Not on file   Number of children: 1   Years of education: 102   Highest education level: 12th grade  Occupational History   Occupation: disabled  Tobacco Use   Smoking status: Former    Packs/day: 0.50    Years: 33.00    Total pack years: 16.50    Types: Cigarettes    Quit date: 05/08/2020    Years since quitting: 1.9   Smokeless tobacco: Former    Quit date: 05/08/2020   Tobacco comments:    Quit Sept 2021  Vaping Use   Vaping Use: Former  Substance and Sexual Activity    Alcohol use: Not Currently    Comment: occassionally   Drug use: No   Sexual activity: Not Currently  Other Topics Concern   Not on file  Social History Narrative   Lives at home with wife and his dad's wife.        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    Social Determinants of Health   Financial Resource Strain: Medium Risk (01/20/2018)   Overall Financial Resource Strain (CARDIA)    Difficulty of Paying Living Expenses: Somewhat hard  Food Insecurity: Food Insecurity Present (01/20/2018)  Hunger Vital Sign    Worried About Running Out of Food in the Last Year: Often true    Ran Out of Food in the Last Year: Sometimes true  Transportation Needs: No Transportation Needs (09/02/2017)   PRAPARE - Hydrologist (Medical): No    Lack of Transportation (Non-Medical): No  Physical Activity: Insufficiently Active (01/20/2018)   Exercise Vital Sign    Days of Exercise per Week: 7 days    Minutes of Exercise per Session: 10 min  Stress: Stress Concern Present (09/02/2017)   [place]    Feeling of Stress : Very much  Social Connections: Moderately Isolated (09/02/2017)   Social Connection and Isolation Panel [NHANES]    Frequency of Communication with Friends and Family: Twice a week    Frequency of Social Gatherings with Friends and Family: Once a week    Attends Religious Services: Never    Marine scientist or Organizations: No    Attends Archivist Meetings: Never    Marital Status: Widowed     Family History: The patient's family history includes Cancer in his maternal grandfather and paternal aunt; Diabetes in his maternal grandfather and mother; Diabetes Mellitus II in his mother; Gout in his father; Heart attack in his mother; Hypertension in his father and mother; Hypothyroidism in his mother; Kidney failure  in his mother.  ROS:   Please see the history of present illness.    All other systems reviewed and are negative.  EKGs/Labs/Other Studies Reviewed:    The following studies were reviewed today:  February 06, 2022 echo limited EF 30 to 35% Mild to moderate AI   April 13, 2020 left and right heart catheterization Severe single-vessel coronary disease with a CTO of the proximal RCA Mild to moderate nonobstructive coronary disease involving the LAD and circumflex Severely reduced Fick cardiac output and index Moderate pulmonary hypertension  EKG on March 01, 2022 shows sinus tachycardia.  QRS duration is 98 ms.  Recent Labs: 11/05/2021: TSH 1.270 12/04/2021: B Natriuretic Peptide 243.0 01/17/2022: Magnesium 2.4 02/08/2022: ALT 26 03/01/2022: BUN 25; Creatinine, Ser 1.77; Hemoglobin 14.8; Platelets 128; Potassium 3.3; Sodium 134  Recent Lipid Panel    Component Value Date/Time   CHOL 110 11/25/2021 0620   CHOL 157 11/05/2021 1403   TRIG 153 (H) 11/25/2021 0620   HDL 17 (L) 11/25/2021 0620   HDL 28 (L) 11/05/2021 1403   CHOLHDL 6.5 11/25/2021 0620   VLDL 31 11/25/2021 0620   LDLCALC 62 11/25/2021 0620   LDLCALC 56 11/05/2021 1403   LDLDIRECT 60.6 11/23/2020 2101    Physical Exam:    VS:  BP 132/70   Pulse 83   Ht 6' (1.829 m)   Wt 240 lb (108.9 kg)   SpO2 96%   BMI 32.55 kg/m     Wt Readings from Last 3 Encounters:  04/17/22 240 lb (108.9 kg)  03/26/22 239 lb (108.4 kg)  02/14/22 239 lb (108.4 kg)     GEN:  Well nourished, well developed in no acute distress HEENT: Normal NECK: No JVD; No carotid bruits LYMPHATICS: No lymphadenopathy CARDIAC: RRR, no murmurs, rubs, gallops.  2 out of 4 diastolic murmur at the left upper sternal border. RESPIRATORY:  Clear to auscultation without rales, wheezing or rhonchi  ABDOMEN: Soft, non-tender, non-distended MUSCULOSKELETAL:  No edema; No deformity  SKIN: Warm and dry NEUROLOGIC:  Alert and oriented x 3 PSYCHIATRIC:  Normal  affect       ASSESSMENT:    1. Chronic systolic CHF (congestive heart failure) ([place])   2. NSVT (nonsustained ventricular tachycardia) (HCC)   3. Pulmonary embolism, unspecified chronicity, unspecified pulmonary embolism type, unspecified whether acute cor pulmonale present ([place])    PLAN:    In order of problems listed above:  #Chronic systolic heart failure Persistently reduced ejection fraction despite medical therapy.  Continue Coreg, Lisabeth Register, spironolactone.  Had previously been on beta-blockers but these were stopped due to marginal cardiac indices.  Discussed role of ICD for primary prevention during today's office visit.  He wishes to proceed with implant.  Plan for a subcutaneous ICD given lack of pacing indication, narrow QRS and relatively young age.  The patient has an mixed nonischemic/ischemic CM (EF 25%), NYHA Class III CHF, and CAD.  He is referred by Dr Rockey Situ for risk stratification of sudden death and consideration of ICD implantation.  At this time, he meets criteria for ICD implantation for primary prevention of sudden death.  I have had a thorough discussion with the patient reviewing options.  The patient and their family (if available) have had opportunities to ask questions and have them answered. The patient and I have decided together through a shared decision making process to proceed with ICD implant at this time.    Risks, benefits, alternatives to ICD implantation were discussed in detail with the patient today. The patient understands that the risks include but are not limited to bleeding, infection, pneumothorax, perforation, tamponade, vascular damage, renal failure, MI, stroke, death, inappropriate shocks, and lead dislodgement and wishes to proceed.  We will therefore schedule device implantation at the next available time.  He will need to hold his anticoagulation for 3 days prior to implant.       Medication Adjustments/Labs and Tests  Ordered: Current medicines are reviewed at length with the patient today.  Concerns regarding medicines are outlined above.  No orders of the defined types were placed in this encounter.  No orders of the defined types were placed in this encounter.    Signed, Hilton Cork. Quentin Ore, MD, [facility], [facility] 04/17/2022 9:11 AM    Electrophysiology [facility]

## 2022-04-17 ENCOUNTER — Encounter: Payer: Self-pay | Admitting: Cardiology

## 2022-04-17 ENCOUNTER — Other Ambulatory Visit: Payer: Self-pay

## 2022-04-17 ENCOUNTER — Encounter: Payer: Self-pay | Admitting: *Deleted

## 2022-04-17 ENCOUNTER — Ambulatory Visit: Payer: Medicaid Other | Attending: Cardiology | Admitting: Cardiology

## 2022-04-17 VITALS — BP 132/70 | HR 83 | Ht 72.0 in | Wt 240.0 lb

## 2022-04-17 DIAGNOSIS — Z01818 Encounter for other preprocedural examination: Secondary | ICD-10-CM

## 2022-04-17 DIAGNOSIS — Z95810 Presence of automatic (implantable) cardiac defibrillator: Secondary | ICD-10-CM

## 2022-04-17 DIAGNOSIS — I4729 Other ventricular tachycardia: Secondary | ICD-10-CM

## 2022-04-17 DIAGNOSIS — I5022 Chronic systolic (congestive) heart failure: Secondary | ICD-10-CM | POA: Diagnosis not present

## 2022-04-17 DIAGNOSIS — I2699 Other pulmonary embolism without acute cor pulmonale: Secondary | ICD-10-CM | POA: Diagnosis not present

## 2022-04-17 NOTE — Addendum Note (Signed)
Addended by: Darrell Jewel on: 04/17/2022 09:32 AM   Modules accepted: Orders

## 2022-04-17 NOTE — Patient Instructions (Signed)
Medication Instructions:  none *If you need a refill on your cardiac medications before your next appointment, please call your pharmacy*   Lab Work: Sept 5 - [place] Entrance at [facility] 1st desk on the right to check in Lab hours: 7:30 am- 5:30 pm (walk in basis)  If you have labs (blood work) drawn today and your tests are completely normal, you will receive your results only by: MyChart Message (if you have MyChart) OR A paper copy in the mail If you have any lab test that is abnormal or we need to change your treatment, we will call you to review the results.   Testing/Procedures: Your physician has recommended that you have a defibrillator inserted. An implantable cardioverter defibrillator (ICD) is a small device that is placed in your chest or, in rare cases, your abdomen. This device uses electrical pulses or shocks to help control life-threatening, irregular heartbeats that could lead the heart to suddenly stop beating (sudden cardiac arrest). Leads are attached to the ICD that goes into your heart. This is done in the hospital and usually requires an overnight stay. Please see the instruction sheet given to you today for more information.   Follow-Up: At [facility], you and your health needs are our priority.  As part of our continuing mission to provide you with exceptional heart care, we have created designated Provider Care Teams.  These Care Teams include your primary Cardiologist (physician) and Advanced Practice Providers (APPs -  Physician Assistants and Nurse Practitioners) who all work together to provide you with the care you need, when you need it.  We recommend signing up for the patient portal called "MyChart".  Sign up information is provided on this After Visit Summary.  MyChart is used to connect with patients for Virtual Visits (Telemedicine).  Patients are able to view lab/test results, encounter notes, upcoming appointments, etc.  Non-urgent  messages can be sent to your provider as well.   To learn more about what you can do with MyChart, go to [email or web address].    Your next appointment:   See instruction letter.   Important Information About Sugar

## 2022-04-20 IMAGING — DX DG CHEST 1V PORT
1 series · 1 of 1 positions shown · non-contrast
Comparison: June 11, 20.

CLINICAL DATA: Chest pain.  Shortness of breath.

EXAM:
PORTABLE CHEST 1 VIEW

[chest ap]
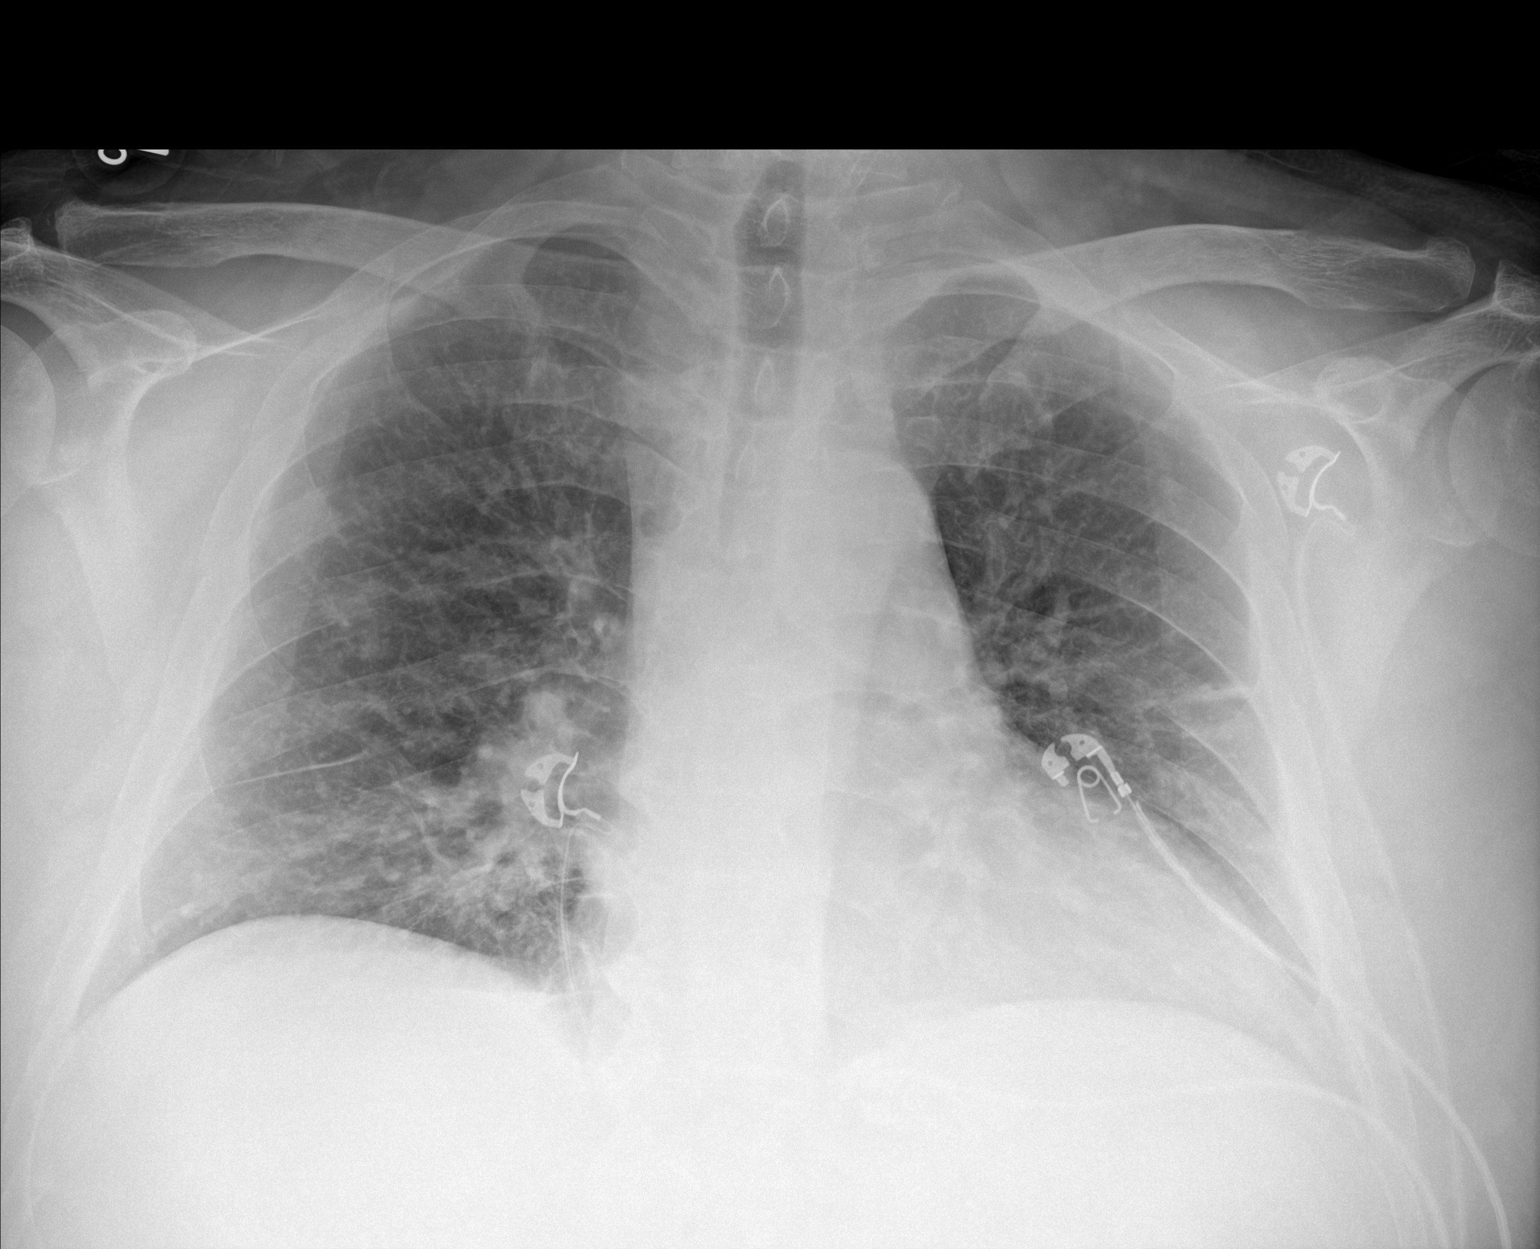

[1 of 1 positions shown; findings below may reference images not displayed]

FINDINGS: Similar interstitial prominence bilaterally. Similar
scarring/atelectasis in the left midlung. No new consolidation. No
visible pleural effusions or pneumothorax. Similar mild enlargement
of the cardiac silhouette with pulmonary vascular congestion. No
acute osseous abnormality.
IMPRESSION: 1. Similar interstitial prominence bilaterally, which may represent
mild interstitial edema versus the sequela of recurrent bouts of
CHF.
2. Mild cardiomegaly and pulmonary vascular congestion.

## 2022-04-20 NOTE — Progress Notes (Unsigned)
Patient ID: Gabriel [name]., male    DOB: 1970-11-21, 51 y.o.   MRN: [id number]  HPI  Gabriel [name] is a 51 y/o male with a history of HTN, diabetes, COPD, CKD, asthma, previous tobacco use and chronic heart failure.    Echo report from 02/06/22 reviewed and showed an EF of 30-35% along with mild/moderate AR. Echo report from 11/24/21 reviewed and showed an EF of 20-25% along with mild LVH and mild Gabriel. Echo results from 05/21/21 reviewed and showed an EF of 40-45% along with mild LVH and mild Gabriel. Echo report from 05/21/21 reviewed and showed an EF of 40-45% along with mild LVH and mild Gabriel. Echo report from 06/08/20 reviewed and showed an EF of 35-40% along with mildly elevated PA pressure of 44.7 mmHg, moderate LAE and mild/moderate Gabriel. Echo report from 11/23/19 reviewed and showed an EF of 25-30% with mild LVH and moderately elevated PA Pressure. Echo report from 06/26/2019 reviewed and showed an EF of 30-35% along with moderately elevated PA pressure. Echo done 04/29/17 showed EF 45-50%. Echo report that was done 11/28/16 and it showed an EF of 40-45% along with mild Gabriel. EF has improved from 30-35% July 2017.   RHC/LHC done 06/13/20 and showed: Severe single-vessel coronary artery disease with chronic total occlusion of the proximal RCA.  The distal vessel fills via left to right collaterals. Mild to moderate, nonobstructive CAD involving the LAD and LCx with up to 50% stenosis. Mildly elevated left and right heart filling pressures. Moderate pulmonary hypertension (mean PAP 40 mmHg) with significantly elevated pulmonary vascular resistance (PVR 7.4 WU). Severely reduced Fick cardiac output/index.   Was in the ED 03/01/22 due to acute viral syndrome where he was treated and released. Admitted 01/17/22 due to nausea and vomiting, polydipsia, polyuria. Blood glucose was elevated at 534. Creatinine was 2.42. Cardiology consult obtained. Given IVF and insulin. Lantus increased. Discharged after 2 days. Was in the ED  12/04/21 due to acute on chronic heart failure where he was treated and released. Admitted 11/24/21 due to SOB and abdominal pain. CT scan of the abdomen and liver ultrasound were concerning for cholecystitis.  However, HIDA scan did not show any evidence of cholecystitis. Cardiology and surgical consults obtained. Initially given IV lasix with transition to oral diuretics. Started on spironolactone. Discharged after 4 days.   He presents today for a follow-up visit with a chief complaint of shortness of breath with exertion. This is chronic in nature. He says that with minimal exertion and at rest, he's not short of breath but walking around the grocery store or walking from the the office to his truck causes shortness of breath. He has associated fatigue, chronic chest pain, dizziness, headache, abdominal distention, right knee pain, depression and gradual weight gain along with this. He denies any difficulty sleeping, palpitations, pedal edema, wheezing or cough.   Says that his right knee is causing him quite a bit of pain and is asking about a referral to orthopaedics.   Admits that his depression is worsening because of his finances. Is no longer able to work and he still hasn't gotten approved for his disability so currently has no money coming in. Lives with his dad who had to borrow money so that the patient could get his medications last month.   Gets entresto through patient assistance.   Past Medical History:  Diagnosis Date   CAD (coronary artery disease)    a. 04/2015 low risk MV;  b. 12/2016  Cath: minor irregs in LAD/Diag/LCX/OM, RCA 40p/m/d; c. 05/2020 Cath: LM nl, LAD 50d, LCX 30p, OM1 40, RCA 100p w/ L->R collats. CO/CI 3.1/1.3-->Med rx.   Chest wall pain, chronic    Chronic Troponin Elevation    CKD (chronic kidney disease), stage II-III    COPD (chronic obstructive pulmonary disease) (HCC)    Diabetes mellitus without complication ([place])    HFrEF (heart failure with reduced ejection  fraction) ([place])    a. 03/2015 Echo: EF 45-50%; b. 12/2015 Echo: EF 20-25%; c. 02/2016 Echo: EF 30-35%; d. 11/2016 Echo: EF 40-45%; e. 06/2019 Echo: EF 30-35%; f. 11/2019 Echo: EF 25-30%; g. 05/2020 Echo: EF 35-40%; h. 05/2021 Echo: EF 40-45%; i. 11/2021 Echo: EF 20-25%, glob HK, mild LVH, GrIII DD, sev red RV fxn, mild Gabriel.   Hypertension    Mitral regurgitation    Mild to moderate by October 2021 echocardiogram.   Mixed Ischemic & NICM (nonischemic cardiomyopathy) ([place])    a. 03/2015 Echo: EF 45-50%; b. 12/2015 Echo: EF 20-25%; c. 02/2016 Echo: EF 30-35%; d. 11/2016 Echo: EF 40-45%; e. 06/2019 Echo: EF 30-35%; f. 11/2019 Echo: EF 25-30%; g. 05/2020 Echo: EF 35-40%; h. 05/2021 Echo: EF 40-45%; i. 11/2021 Echo: EF 20-25%   Myocardial infarct (HCC)    NSVT (nonsustained ventricular tachycardia) ([place])    a. 12/2015 noted on tele-->amio;  b. 12/2015 Event monitor: no VT noted.   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  12/31/19: RUL and RLL PEs.   Syncope    a. 01/2016 - felt to be vasovagal.   Past Surgical History:  Procedure Laterality Date   AMPUTATION     CARDIAC CATHETERIZATION     FINGER AMPUTATION     Traumatic   FINGER FRACTURE SURGERY Left    LEFT HEART CATH AND CORONARY ANGIOGRAPHY N/A 01/06/2017   Procedure: Left Heart Cath and Coronary Angiography;  Surgeon: Wellington Hampshire, MD;  Location: [facility] [facility];  Service: Cardiovascular;  Laterality: N/A;   RIGHT/LEFT HEART CATH AND CORONARY ANGIOGRAPHY N/A 06/13/2020   Procedure: RIGHT/LEFT HEART CATH AND CORONARY ANGIOGRAPHY;  Surgeon: Nelva Bush, MD;  Location: [place] [facility];  Service: Cardiovascular;  Laterality: N/A;   Family History  Problem Relation Age of Onset   Diabetes Mother    Diabetes Mellitus II Mother    Hypothyroidism Mother    Hypertension Mother    Kidney failure Mother        Dialysis   Heart attack Mother        54 yo  approximately   Hypertension Father    Gout Father    Cancer Maternal Grandfather    Diabetes Maternal Grandfather    Cancer Paternal Aunt    Social History   Tobacco Use   Smoking status: Former    Packs/day: 0.50    Years: 33.00    Total pack years: 16.50    Types: Cigarettes    Quit date: 05/08/2020    Years since quitting: 1.9   Smokeless tobacco: Former    Quit date: 05/08/2020   Tobacco comments:    Quit Sept 2021  Substance Use Topics   Alcohol use: Not Currently    Comment: occassionally   Allergies  Allergen Reactions   Metformin And Related Nausea And Vomiting   Prednisone Other (See Comments)    Reaction: Hallucinations     Zantac [Ranitidine Hcl] Diarrhea and Nausea Only  Night sweats   Prior to Admission medications   Medication Sig Start Date End Date Taking? Authorizing Provider  carvedilol (COREG) 3.125 MG tablet Take 1 tablet (3.125 mg total) by mouth 2 (two) times daily with a meal. 02/14/22  Yes Theora Gianotti, NP  dapagliflozin propanediol (FARXIGA) 10 MG TABS tablet Take 1 tablet (10 mg total) by mouth daily. 04/23/22  Yes Ramata Strothman A, FNP  Dulaglutide (TRULICITY) 3 WE/3.1VQ SOPN Inject 3 mg as directed once a week. 02/08/22  Yes [name], FNP  fenofibrate (TRICOR) 145 MG tablet Take 1 tablet (145 mg total) by mouth daily. 01/15/22  Yes Shamica Moree, Otila Kluver A, FNP  FLUoxetine (PROZAC) 20 MG capsule Take 1 capsule (20 mg total) by mouth daily. 02/08/22  Yes [name], FNP  insulin aspart (NOVOLOG) 100 UNIT/ML FlexPen Inject 3 units into the skin before breakfast and lunch and 5 units before dinner. 01/21/22  Yes Swayze, Ava, DO  Insulin Glargine (BASAGLAR KWIKPEN) 100 UNIT/ML Inject 36 Units into the skin at bedtime. 01/19/22 02/09/23 Yes Swayze, Ava, DO  Insulin Pen Needle (PEN NEEDLES) 32G X 4 MM MISC 200 each by Does not apply route at bedtime. 09/05/21  Yes [name], FNP  meclizine (ANTIVERT) 25 MG tablet Take by mouth 3 (three) times  daily. 02/14/22  Yes Theora Gianotti, NP  nitroGLYCERIN (NITROSTAT) 0.4 MG SL tablet Place 1 tablet (0.4 mg total) under the tongue every 5 (five) minutes x 3 doses as needed for chest pain. 06/09/20  Yes Lorella Nimrod, MD  potassium chloride SA (KLOR-CON M20) 20 MEQ tablet Take 1 tablet daily 01/15/22  Yes Trude Cansler, Aura Fey, FNP  rivaroxaban (XARELTO) 20 MG TABS tablet Take 1 tablet (20 mg total) by mouth daily with supper. 01/15/22  Yes [name] A, FNP  rosuvastatin (CRESTOR) 40 MG tablet Take 1 tablet (40 mg total) by mouth daily. 04/01/22  Yes Gollan, Kathlene November, MD  sacubitril-valsartan (ENTRESTO) 24-26 MG Take 1 tablet by mouth 2 (two) times daily. 12/20/21  Yes [name] A, FNP  spironolactone (ALDACTONE) 25 MG tablet Take 0.5 tablets (12.5 mg total) by mouth daily. Patient taking differently: Take 25 mg by mouth daily. 04/01/22  Yes Gollan, Kathlene November, MD  sucralfate (CARAFATE) 1 g tablet TAKE 1 TABLET BY MOUTH 4 TIMES DAILY WITH MEALS AND AT BEDTIME Patient taking differently: Take 1 g by mouth 4 (four) times daily -  with meals and at bedtime. 03/26/22  Yes   torsemide (DEMADEX) 20 MG tablet Take 2 tablets (40 mg total) by mouth 2 (two) times daily. 01/15/22  Yes Avanish Cerullo A, FNP  Ipratropium-Albuterol (COMBIVENT RESPIMAT) 20-100 MCG/ACT AERS respimat Inhale 1 puff into the lungs every 6 (six) hours as needed for wheezing. Patient not taking: Reported on 04/23/2022    [provider]  traZODone (DESYREL) 100 MG tablet Take 1 tablet (100 mg total) by mouth at bedtime. Patient not taking: Reported on 04/23/2022 02/08/22   Tally Joe T, FNP  insulin lispro (HUMALOG) 100 UNIT/ML KwikPen Inject 3 units before breakfast and lunch. 5 units before dinner. 01/19/22 01/21/22  Swayze, Ava, DO    Review of Systems  Constitutional:  Positive for fatigue. Negative for appetite change.  HENT:  Negative for congestion, postnasal drip and sore throat.   Eyes: Negative.   Respiratory:   Positive for shortness of breath. Negative for cough and wheezing.   Cardiovascular:  Positive for chest pain (unchanged). Negative for palpitations and leg  swelling.  Gastrointestinal:  Positive for abdominal distention. Negative for abdominal pain.  Endocrine: Negative.   Genitourinary: Negative.   Musculoskeletal:  Positive for arthralgias (right knee and hips). Negative for back pain and neck pain.  Skin: Negative.   Allergic/Immunologic: Negative.   Neurological:  Positive for dizziness and headaches (chronic across forehead). Negative for light-headedness.  Hematological:  Negative for adenopathy. Does not bruise/bleed easily.  Psychiatric/Behavioral:  Positive for dysphoric mood. Negative for sleep disturbance. The patient is not nervous/anxious.    Vitals:   04/23/22 0815  BP: (!) 159/89  Pulse: 88  Resp: 14  SpO2: 100%  Weight: 245 lb 4 oz (111.2 kg)  Height: 6' (1.829 m)   Wt Readings from Last 3 Encounters:  04/23/22 245 lb 4 oz (111.2 kg)  04/17/22 240 lb (108.9 kg)  03/26/22 239 lb (108.4 kg)   Lab Results  Component Value Date   CREATININE 1.77 (H) 03/01/2022   CREATININE 1.46 (H) 02/08/2022   CREATININE 2.06 (H) 01/19/2022   Physical Exam Vitals and nursing note reviewed.  Constitutional:      Appearance: He is well-developed.  HENT:     Head: Normocephalic and atraumatic.  Cardiovascular:     Rate and Rhythm: Normal rate and regular rhythm.  Pulmonary:     Effort: Pulmonary effort is normal. No accessory muscle usage.     Breath sounds: No wheezing, rhonchi or rales.  Abdominal:     General: There is distension.     Palpations: Abdomen is soft.     Tenderness: There is no abdominal tenderness.  Musculoskeletal:     Right lower leg: No tenderness. No edema.     Left lower leg: No tenderness. No edema.  Skin:    General: Skin is warm and dry.  Neurological:     General: No focal deficit present.     Mental Status: He is alert and oriented to person,  place, and time.  Psychiatric:        Mood and Affect: Mood normal.        Behavior: Behavior normal.    Assessment & Plan:   1: Acute on Chronic heart failure with reduced ejection fraction- - NYHA class II - fluid overloaded with weight gain, abdominal distention and SOB - weighing daily and reminded to call for an overnight weight gain of >2 pounds or a weekly weight gain of >5 pounds - weight up 10 pounds from last visit here 3 months ago  - patient watched furoscix video and agreeable to this - 2 samples of furoscicix ('80mg'$  furosemide)) given and he was instructed to use 1 today with taking 44mq potassium and use the second one tomorrow with taking 479m potassium - not taking potassium daily but does have it at home; last potassium level on 03/26/22 was 4.2 - will check BMP in 2 days - patient verbalized understanding - not adding salt and he was encouraged to closely follow a '2000mg'$  sodium diet - saw cardiology (DBarbarann Ehlers6/29/23 - on GDMT of carvedilol, farxiga, spironolactone & entresto  - farxiga patient assistance forms filled out today; he already gets entresto through patient assistance - saw EP (LQuentin Ore8/30/23; ICD to be placed 05/20/22 - saw provider at [facility] 07/06/20 - BNP from 12/04/21 was 243.0   2: HTN- - BP elevated (159/89) - saw PCP (PRollene Rotunda8/8/23 - BMP from 03/26/22 reviewed and showed sodium 140, potassium 4.2, creatinine 1.3 and GFR 66   3: Diabetes-   -  A1c 9.8% on 02/08/22 - glucose in the office this morning was 268 - hasn't been able to check his glucose at home because he can't afford the test strips - saw nephrology Lanora Manis) 03/26/22  4: Right knee pain- - limping into the office due to his knee pain - referral to [facility] placed today, per his request - has tried wearing a knee brace but without success  5: Depression- - patient reports worsening depression due to his lack of finances - lives with his dad who is also on a fixed  income and his dad had to borrow money recently so patient could get his medications - expresses thoughts of "not wanting to live" but has a teenage son that he loves being around; denies any active plan - feels like if he could get approved for disability, then his mental health would improve because then he would have money coming in - will let [facility] social worker aware and see if there's any resources available to help him; he was agreeable to this      Patient did not bring his medications nor a list. Each medication was verbally reviewed with the patient and he was encouraged to bring the bottles to every visit to confirm accuracy of list.   Return in 2 days, sooner if needed.

## 2022-04-23 ENCOUNTER — Other Ambulatory Visit: Payer: Self-pay

## 2022-04-23 ENCOUNTER — Ambulatory Visit (HOSPITAL_BASED_OUTPATIENT_CLINIC_OR_DEPARTMENT_OTHER): Payer: Medicaid Other | Admitting: Family

## 2022-04-23 ENCOUNTER — Encounter: Payer: Self-pay | Admitting: Family

## 2022-04-23 ENCOUNTER — Other Ambulatory Visit
Admission: RE | Admit: 2022-04-23 | Discharge: 2022-04-23 | Disposition: A | Discharge status: Home / Self Care | Payer: Medicaid Other | Source: Ambulatory Visit | Attending: Cardiology | Admitting: Cardiology

## 2022-04-23 ENCOUNTER — Telehealth (HOSPITAL_COMMUNITY): Payer: Self-pay | Admitting: Licensed Clinical Social Worker

## 2022-04-23 VITALS — BP 159/89 | HR 88 | Resp 14 | Ht 72.0 in | Wt 245.2 lb

## 2022-04-23 DIAGNOSIS — I4729 Other ventricular tachycardia: Secondary | ICD-10-CM | POA: Insufficient documentation

## 2022-04-23 DIAGNOSIS — I5022 Chronic systolic (congestive) heart failure: Secondary | ICD-10-CM | POA: Diagnosis not present

## 2022-04-23 DIAGNOSIS — G8929 Other chronic pain: Secondary | ICD-10-CM | POA: Diagnosis not present

## 2022-04-23 DIAGNOSIS — M25561 Pain in right knee: Secondary | ICD-10-CM

## 2022-04-23 DIAGNOSIS — F339 Major depressive disorder, recurrent, unspecified: Secondary | ICD-10-CM | POA: Diagnosis not present

## 2022-04-23 DIAGNOSIS — I10 Essential (primary) hypertension: Secondary | ICD-10-CM | POA: Diagnosis not present

## 2022-04-23 DIAGNOSIS — N1832 Chronic kidney disease, stage 3b: Secondary | ICD-10-CM | POA: Diagnosis not present

## 2022-04-23 DIAGNOSIS — I2699 Other pulmonary embolism without acute cor pulmonale: Secondary | ICD-10-CM | POA: Diagnosis not present

## 2022-04-23 DIAGNOSIS — E1122 Type 2 diabetes mellitus with diabetic chronic kidney disease: Secondary | ICD-10-CM

## 2022-04-23 DIAGNOSIS — Z794 Long term (current) use of insulin: Secondary | ICD-10-CM

## 2022-04-23 DIAGNOSIS — I5023 Acute on chronic systolic (congestive) heart failure: Secondary | ICD-10-CM

## 2022-04-23 DIAGNOSIS — Z01818 Encounter for other preprocedural examination: Secondary | ICD-10-CM | POA: Insufficient documentation

## 2022-04-23 LAB — CBC WITH DIFFERENTIAL/PLATELET
Abs Immature Granulocytes: 0.05 10*3/uL (ref 0.00–0.07)
Basophils Absolute: 0.1 10*3/uL (ref 0.0–0.1)
Basophils Relative: 1 %
Eosinophils Absolute: 0.2 10*3/uL (ref 0.0–0.5)
Eosinophils Relative: 2 %
HCT: 49.6 % (ref 39.0–52.0)
Hemoglobin: 17.2 g/dL — ABNORMAL HIGH (ref 13.0–17.0)
Immature Granulocytes: 1 %
Lymphocytes Relative: 26 %
Lymphs Abs: 2.1 10*3/uL (ref 0.7–4.0)
MCH: 29.5 pg (ref 26.0–34.0)
MCHC: 34.7 g/dL (ref 30.0–36.0)
MCV: 84.9 fL (ref 80.0–100.0)
Monocytes Absolute: 0.5 10*3/uL (ref 0.1–1.0)
Monocytes Relative: 6 %
Neutro Abs: 5.2 10*3/uL (ref 1.7–7.7)
Neutrophils Relative %: 64 %
Platelets: 176 10*3/uL (ref 150–400)
RBC: 5.84 MIL/uL — ABNORMAL HIGH (ref 4.22–5.81)
RDW: 13.5 % (ref 11.5–15.5)
WBC: 8 10*3/uL (ref 4.0–10.5)
nRBC: 0 % (ref 0.0–0.2)

## 2022-04-23 LAB — BASIC METABOLIC PANEL
Anion gap: 8 (ref 5–15)
BUN: 19 mg/dL (ref 6–20)
CO2: 21 mmol/L — ABNORMAL LOW (ref 22–32)
Calcium: 9.6 mg/dL (ref 8.9–10.3)
Chloride: 109 mmol/L (ref 98–111)
Creatinine, Ser: 1.17 mg/dL (ref 0.61–1.24)
GFR, Estimated: >60 mL/min (ref >60)
Glucose, Bld: 257 mg/dL — ABNORMAL HIGH (ref 70–99)
Potassium: 4.3 mmol/L (ref 3.5–5.1)
Sodium: 138 mmol/L (ref 135–145)

## 2022-04-23 LAB — GLUCOSE, CAPILLARY: Glucose-Capillary: 268 mg/dL — ABNORMAL HIGH (ref 70–99)

## 2022-04-23 MED ORDER — DAPAGLIFLOZIN PROPANEDIOL 10 MG PO TABS: 10.0000 mg | ORAL_TABLET | Freq: Every day | ORAL | 3 refills | Status: DC

## 2022-04-23 NOTE — Patient Instructions (Addendum)
Continue weighing daily and call for an overnight weight gain of 3 pounds or more or a weekly weight gain of more than 5 pounds.   If you have voicemail, please make sure your mailbox is cleaned out so that we may leave a message and please make sure to listen to any voicemails.    Your provider has order Furoscix for you. This is an on-body infuser that gives you a dose of Furosemide.   It will be shipped to your home   [facility] [facility] will call you to discuss before shipping so, PLEASE answer unknown calls  For questions regarding the device call [employer] at [phone]  Ensure you write down the time you start your infusion so that if there is a problem you will know how long the infusion lasted  Use Furoscix only AS DIRECTED by our office  Dosing Directions:   Day 1= today use furoscix and take 3 potassium tablets  Day 2= tomorrow use furoscix and take 2 potassium tablets

## 2022-04-23 NOTE — Telephone Encounter (Signed)
H&V Care Navigation CSW Progress Note  Clinical Social Worker contacted patient by phone to discuss medication access concerns.  Patient is participating in a Managed Medicaid Plan:  Yes  CSW received consult that Gabriel [name] has been struggling paying for medications and diabetic testing strips.  CSW called Gabriel [name] to discuss.  Has Medicaid so copays are $4 but this adds up and patient unable to afford consistently each month as he has no source of income - pending disability- Gabriel [name] being supported by his father.  CSW spoke to current pharmacy who reports they can create an AR account for Gabriel [name] so he doesn't have to pay upfront but reports they cannot waive copays and would expect payments from Gabriel [name].  CSW then called [facility] in [place] who confirms they can waive copays for patient- Gabriel [name] informed of this and provided with their contact number to call and get meds transferred.  Also concerns that insurance not covering diabetic testing supplies.  Confirmed with pharmacy that preferred supplies for Medicaid is Accucheck guide and PCP would need to send in script for testing kit to pharmacy- Gabriel [name] informed of this.    SDOH Screenings   Food Insecurity: Food Insecurity Present (01/20/2018)  Housing: Low Risk  (01/28/2020)  Transportation Needs: No Transportation Needs (09/02/2017)  Alcohol Screen: Low Risk  (03/26/2022)  Depression (PHQ2-9): Medium Risk (04/23/2022)  Financial Resource Strain: High Risk (04/23/2022)  Physical Activity: Insufficiently Active (01/20/2018)  Social Connections: Moderately Isolated (09/02/2017)  Stress: Stress Concern Present (09/02/2017)  Tobacco Use: Medium Risk (04/23/2022)   Dolora Ridgely H. Errick Salts, LCSW Clinical Social Worker [facility] Desk#: [phone] Cell#: [phone]      

## 2022-04-24 ENCOUNTER — Ambulatory Visit: Payer: Medicaid Other

## 2022-04-24 ENCOUNTER — Other Ambulatory Visit: Payer: Medicaid Other | Admitting: Pharmacist

## 2022-04-24 DIAGNOSIS — E1169 Type 2 diabetes mellitus with other specified complication: Secondary | ICD-10-CM

## 2022-04-24 DIAGNOSIS — I5023 Acute on chronic systolic (congestive) heart failure: Secondary | ICD-10-CM

## 2022-04-24 DIAGNOSIS — I10 Essential (primary) hypertension: Secondary | ICD-10-CM

## 2022-04-24 NOTE — Patient Instructions (Signed)
Yacoub,   I will talk to Tally Joe about the Ozempic and the [place]. I'll be in touch.   Thanks!  Catie Hedwig Morton, PharmD, [facility] [phone]

## 2022-04-24 NOTE — Progress Notes (Signed)
Chief Complaint  Patient presents with   Diabetes   Hyperlipidemia   Medication Management    Gabriel [name]. is a 51 y.o. year old male who presented for a telephone visit.   They were referred to the pharmacist by their PCP for assistance in managing diabetes and medication access.   Patient is participating in a Managed Medicaid Plan:  Yes  Subjective:  Care Team: Primary Care Provider: [name], FNP ; Next Scheduled Visit: 05/09/22 Cardiologist: Hackney/Dick/Lambert; Next Scheduled Visit: 04/25/22 w/ HF clinic  Medication Access/Adherence  Current Pharmacy:  [facility] [place] [address] Phone: [phone] Fax: [phone]   Patient reports affordability concerns with their medications: Yes  Patient reports access/transportation concerns to their pharmacy: No  Patient reports adherence concerns with their medications:  No  reports that he spoke with HF Clinic SW yesterday, she called the [facility] and they noted they would be able to waive Medicaid copays. He plans to go by there tomorrow to ask about it.    Diabetes:  Current medications: Farxiga 10 mg daily, Trulicity 3 mg weekly, Basaglar 36 units daily, Novolog 3/3/5 units daily with meals Medications tried in the past:   Current glucose readings: reports readings remain in 200s and he is frustrated  Current meal patterns: minimizes sugars and carbs; has switched to higher fiber fruits; water or unsweet tea  Previously discussed pursuing coverage for [facility] with PCP. PA was previously submitted.   Today, patient endorses a history of pancreatitis several years ago. He does endorse a history of heavy alcohol use and a history of significantly elevated triglycerides. Denies heavy alcohol use recently and triglycerides are relatively better controlled.   Hyperlipidemia/ASCVD Risk Reduction  Current lipid lowering medications: rosuvastatin 40  mg daily - he may only be taking 1 20 mg tablet instead of 2 20 mg tablets or 1 40 mg tablet, he will check; fenofibrate 145 mg daily  Antiplatelet regimen: none due to [place] Maintenance Due  Topic Date Due   COVID-19 Vaccine (1) Never done   TETANUS/TDAP  Never done   Zoster Vaccines- Shingrix (1 of 2) Never done   FOOT EXAM  03/02/2021   INFLUENZA VACCINE  03/19/2022     Objective: Lab Results  Component Value Date   HGBA1C 9.8 (A) 02/08/2022    Lab Results  Component Value Date   CREATININE 1.17 04/23/2022   BUN 19 04/23/2022   NA 138 04/23/2022   K 4.3 04/23/2022   CL 109 04/23/2022   CO2 21 (L) 04/23/2022    Lab Results  Component Value Date   CHOL 110 11/25/2021   HDL 17 (L) 11/25/2021   LDLCALC 62 11/25/2021   LDLDIRECT 60.6 11/23/2020   TRIG 153 (H) 11/25/2021   CHOLHDL 6.5 11/25/2021    Medications Reviewed Today     Reviewed by Osker Mason, RPH-CPP (Pharmacist) on 04/24/22 at 1411  Med List Status: <None>   Medication Order Taking? Sig Documenting Provider Last Dose Status Informant  carvedilol (COREG) 3.125 MG tablet [id number] Yes Take 1 tablet (3.125 mg total) by mouth 2 (two) times daily with a meal. Theora Gianotti, NP Taking Active   dapagliflozin propanediol (FARXIGA) 10 MG TABS tablet [id number] Yes Take 1 tablet (10 mg total) by mouth daily. Alisa Graff, FNP Taking Active   Dulaglutide (TRULICITY) 3 DU/2.0UR SOPN [id number] Yes Inject 3 mg as directed once  a week. [name], FNP Taking Active   fenofibrate (TRICOR) 145 MG tablet [id number] Yes Take 1 tablet (145 mg total) by mouth daily. Alisa Graff, FNP Taking Active   FLUoxetine (PROZAC) 20 MG capsule [id number] Yes Take 1 capsule (20 mg total) by mouth daily. [name], FNP Taking Active   insulin aspart (NOVOLOG) 100 UNIT/ML FlexPen [id number] Yes Inject 3 units into the skin before breakfast and lunch and 5 units before dinner. Swayze,  Ava, DO Taking Active   Insulin Glargine (BASAGLAR KWIKPEN) 100 UNIT/ML [id number] Yes Inject 36 Units into the skin at bedtime. Swayze, Ava, DO Taking Active     Discontinued 01/21/22 1612 (Change in therapy)            Med Note Janan Ridge   Thu Feb 14, 2022  2:37 PM)    Insulin Pen Needle (PEN NEEDLES) 32G X 4 MM MISC [id number] Yes 200 each by Does not apply route at bedtime. [name], FNP Taking Active Self  Ipratropium-Albuterol (COMBIVENT RESPIMAT) 20-100 MCG/ACT AERS respimat [id number] No Inhale 1 puff into the lungs every 6 (six) hours as needed for wheezing.  Patient not taking: Reported on 04/23/2022   [provider] Not Taking Active Self  nitroGLYCERIN (NITROSTAT) 0.4 MG SL tablet [id number] No Place 1 tablet (0.4 mg total) under the tongue every 5 (five) minutes x 3 doses as needed for chest pain.  Patient not taking: Reported on 04/24/2022   Lorella Nimrod, MD Not Taking Active Self  potassium chloride SA (KLOR-CON M20) 20 MEQ tablet [id number] Yes Take 1 tablet daily Alisa Graff, FNP Taking Active   rivaroxaban (XARELTO) 20 MG TABS tablet [id number] Yes Take 1 tablet (20 mg total) by mouth daily with supper. Alisa Graff, FNP Taking Active   rosuvastatin (CRESTOR) 40 MG tablet [id number] Yes Take 1 tablet (40 mg total) by mouth daily. Minna Merritts, MD Taking Active   sacubitril-valsartan [facility]) 24-26 MG [id number] Yes Take 1 tablet by mouth 2 (two) times daily. Alisa Graff, FNP Taking Active   spironolactone (ALDACTONE) 25 MG tablet [id number] Yes Take 0.5 tablets (12.5 mg total) by mouth daily.  Patient taking differently: Take 25 mg by mouth daily.   Minna Merritts, MD Taking Active            Med Note Jonny Ruiz Apr 24, 2022  2:08 PM) 25 mg daily  sucralfate (CARAFATE) 1 g tablet [id number] No TAKE 1 TABLET BY MOUTH 4 TIMES DAILY WITH MEALS AND AT BEDTIME  Patient not taking: Reported on 04/24/2022    Not Taking Active    torsemide (DEMADEX) 20 MG tablet [id number] Yes Take 2 tablets (40 mg total) by mouth 2 (two) times daily. Alisa Graff, FNP Taking Active            Med Note Tyrone Sage Feb 14, 2022  2:37 PM)    traZODone (DESYREL) 100 MG tablet [id number] No Take 1 tablet (100 mg total) by mouth at bedtime.  Patient not taking: Reported on 04/23/2022   [name], FNP Not Taking Active                Assessment/Plan:   Diabetes: - Currently uncontrolled - Reviewed long term cardiovascular and renal outcomes of uncontrolled blood sugar - Reviewed goal A1c, goal fasting, and goal 2 hour post prandial glucose - Counseled on risk of pancreatitis with GLP1. Given  likely causes for prior episode (alcohol use and/or triglycerides), discussed benefit vs risk. Discussed that if he ever develops significant abdominal pain like prior episode of pancreatitis that he stop GLP1 therapy and seek care. He verbalizes understanding. I am unaware of any evidence to suggest that use of Ozempic or Mounjaro vs Trulicity has any different risk of pancreatitis. - [employer]. They have not processed [facility] PA yet as they have that the patient has a secondary [employer] on file. They are currently conducting a benefits investigation and recommended I call back at the end of the week. They do note that their coverage criteria for [facility] requires a trial and failure of 2 preferred alternatives- so patient likely would have to trial and fail Ozempic prior to coverage. Discussed with patient. He has 2 weeks of Trulicity 3 mg weekly remaining. Recommend transitioning to Ozempic due to greater potency. Will collaborate with PCP on this.  - We also discussed use of CGM. Patient is interested. Will discuss with PCP.    Follow Up Plan: phone call in 2 days  Catie TJodi Mourning, PharmD, [facility] [phone]

## 2022-04-25 ENCOUNTER — Telehealth: Payer: Self-pay | Admitting: Family

## 2022-04-25 ENCOUNTER — Other Ambulatory Visit: Payer: Self-pay

## 2022-04-25 ENCOUNTER — Encounter: Payer: Self-pay | Admitting: Family

## 2022-04-25 ENCOUNTER — Ambulatory Visit (HOSPITAL_BASED_OUTPATIENT_CLINIC_OR_DEPARTMENT_OTHER): Payer: Medicaid Other | Admitting: Family

## 2022-04-25 ENCOUNTER — Other Ambulatory Visit: Payer: Self-pay | Admitting: Pharmacist

## 2022-04-25 ENCOUNTER — Other Ambulatory Visit: Payer: Self-pay | Admitting: Family

## 2022-04-25 ENCOUNTER — Other Ambulatory Visit
Admission: RE | Admit: 2022-04-25 | Discharge: 2022-04-25 | Disposition: A | Discharge status: Home / Self Care | Payer: Medicaid Other | Source: Ambulatory Visit | Attending: Family | Admitting: Family

## 2022-04-25 VITALS — BP 139/78 | HR 93 | Resp 14 | Ht 72.0 in | Wt 242.1 lb

## 2022-04-25 DIAGNOSIS — I10 Essential (primary) hypertension: Secondary | ICD-10-CM | POA: Diagnosis not present

## 2022-04-25 DIAGNOSIS — E1122 Type 2 diabetes mellitus with diabetic chronic kidney disease: Secondary | ICD-10-CM | POA: Diagnosis not present

## 2022-04-25 DIAGNOSIS — F339 Major depressive disorder, recurrent, unspecified: Secondary | ICD-10-CM | POA: Diagnosis not present

## 2022-04-25 DIAGNOSIS — G8929 Other chronic pain: Secondary | ICD-10-CM

## 2022-04-25 DIAGNOSIS — I5023 Acute on chronic systolic (congestive) heart failure: Secondary | ICD-10-CM | POA: Diagnosis present

## 2022-04-25 DIAGNOSIS — I5022 Chronic systolic (congestive) heart failure: Secondary | ICD-10-CM | POA: Insufficient documentation

## 2022-04-25 DIAGNOSIS — M25561 Pain in right knee: Secondary | ICD-10-CM

## 2022-04-25 DIAGNOSIS — Z794 Long term (current) use of insulin: Secondary | ICD-10-CM

## 2022-04-25 DIAGNOSIS — N1832 Chronic kidney disease, stage 3b: Secondary | ICD-10-CM | POA: Diagnosis not present

## 2022-04-25 LAB — BASIC METABOLIC PANEL
Anion gap: 13 (ref 5–15)
BUN: 31 mg/dL — ABNORMAL HIGH (ref 6–20)
CO2: 26 mmol/L (ref 22–32)
Calcium: 9.6 mg/dL (ref 8.9–10.3)
Chloride: 100 mmol/L (ref 98–111)
Creatinine, Ser: 1.56 mg/dL — ABNORMAL HIGH (ref 0.61–1.24)
GFR, Estimated: 53 mL/min — ABNORMAL LOW (ref >60)
Glucose, Bld: 231 mg/dL — ABNORMAL HIGH (ref 70–99)
Potassium: 4.3 mmol/L (ref 3.5–5.1)
Sodium: 139 mmol/L (ref 135–145)

## 2022-04-25 MED ORDER — SPIRONOLACTONE 25 MG PO TABS: 25.0000 mg | ORAL_TABLET | Freq: Every day | ORAL | 3 refills | Status: DC

## 2022-04-25 NOTE — Progress Notes (Signed)
Patient ID: Gabriel Kallal., male    DOB: May 27, 1971, 51 y.o.   MRN: [id number]  HPI  Gabriel [name] is a 51 y/o male with a history of HTN, diabetes, COPD, CKD, asthma, previous tobacco use and chronic heart failure.    Echo report from 02/06/22 reviewed and showed an EF of 30-35% along with mild/moderate AR. Echo report from 11/24/21 reviewed and showed an EF of 20-25% along with mild LVH and mild Gabriel. Echo results from 05/21/21 reviewed and showed an EF of 40-45% along with mild LVH and mild Gabriel. Echo report from 05/21/21 reviewed and showed an EF of 40-45% along with mild LVH and mild Gabriel. Echo report from 06/08/20 reviewed and showed an EF of 35-40% along with mildly elevated PA pressure of 44.7 mmHg, moderate LAE and mild/moderate Gabriel. Echo report from 11/23/19 reviewed and showed an EF of 25-30% with mild LVH and moderately elevated PA Pressure. Echo report from 06/26/2019 reviewed and showed an EF of 30-35% along with moderately elevated PA pressure. Echo done 04/29/17 showed EF 45-50%. Echo report that was done 11/28/16 and it showed an EF of 40-45% along with mild Gabriel. EF has improved from 30-35% July 2017.   RHC/LHC done 06/13/20 and showed: Severe single-vessel coronary artery disease with chronic total occlusion of the proximal RCA.  The distal vessel fills via left to right collaterals. Mild to moderate, nonobstructive CAD involving the LAD and LCx with up to 50% stenosis. Mildly elevated left and right heart filling pressures. Moderate pulmonary hypertension (mean PAP 40 mmHg) with significantly elevated pulmonary vascular resistance (PVR 7.4 WU). Severely reduced Fick cardiac output/index.   Was in the ED 03/01/22 due to acute viral syndrome where he was treated and released. Admitted 01/17/22 due to nausea and vomiting, polydipsia, polyuria. Blood glucose was elevated at 534. Creatinine was 2.42. Cardiology consult obtained. Given IVF and insulin. Lantus increased. Discharged after 2 days. Was in the ED  12/04/21 due to acute on chronic heart failure where he was treated and released. Admitted 11/24/21 due to SOB and abdominal pain. CT scan of the abdomen and liver ultrasound were concerning for cholecystitis.  However, HIDA scan did not show any evidence of cholecystitis. Cardiology and surgical consults obtained. Initially given IV lasix with transition to oral diuretics. Started on spironolactone. Discharged after 4 days.   He presents today for a follow-up visit with a chief complaint of minimal shortness of breath with moderate exertion. Describes this as chronic in nature. Has associated fatigue, chronic chest pain, abdominal distention/ pain, diarrhea, dizziness and headaches along with this. He denies any difficulty sleeping, palpitations, pedal edema, wheezing, cough or weight gain.   Used furoscix X 2 doses along with supplemental potassium and says that he feels better although different urinate a significant amount. Now wondering if his abdominal distention is more related to the GI issues that he has going on. Has upcoming GI appointment but not until November.   Past Medical History:  Diagnosis Date   CAD (coronary artery disease)    a. 04/2015 low risk MV;  b. 12/2016 Cath: minor irregs in LAD/Diag/LCX/OM, RCA 40p/m/d; c. 05/2020 Cath: LM nl, LAD 50d, LCX 30p, OM1 40, RCA 100p w/ L->R collats. CO/CI 3.1/1.3-->Med rx.   Chest wall pain, chronic    Chronic Troponin Elevation    CKD (chronic kidney disease), stage II-III    COPD (chronic obstructive pulmonary disease) (HCC)    Diabetes mellitus without complication (HCC)    HFrEF (  heart failure with reduced ejection fraction) ([place])    a. 03/2015 Echo: EF 45-50%; b. 12/2015 Echo: EF 20-25%; c. 02/2016 Echo: EF 30-35%; d. 11/2016 Echo: EF 40-45%; e. 06/2019 Echo: EF 30-35%; f. 11/2019 Echo: EF 25-30%; g. 05/2020 Echo: EF 35-40%; h. 05/2021 Echo: EF 40-45%; i. 11/2021 Echo: EF 20-25%, glob HK, mild LVH, GrIII DD, sev red RV fxn, mild Gabriel.    Hypertension    Mitral regurgitation    Mild to moderate by October 2021 echocardiogram.   Mixed Ischemic & NICM (nonischemic cardiomyopathy) ([place])    a. 03/2015 Echo: EF 45-50%; b. 12/2015 Echo: EF 20-25%; c. 02/2016 Echo: EF 30-35%; d. 11/2016 Echo: EF 40-45%; e. 06/2019 Echo: EF 30-35%; f. 11/2019 Echo: EF 25-30%; g. 05/2020 Echo: EF 35-40%; h. 05/2021 Echo: EF 40-45%; i. 11/2021 Echo: EF 20-25%   Myocardial infarct (HCC)    NSVT (nonsustained ventricular tachycardia) ([place])    a. 12/2015 noted on tele-->amio;  b. 12/2015 Event monitor: no VT noted.   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  12/31/19: RUL and RLL PEs.   Syncope    a. 01/2016 - felt to be vasovagal.   Past Surgical History:  Procedure Laterality Date   AMPUTATION     CARDIAC CATHETERIZATION     FINGER AMPUTATION     Traumatic   FINGER FRACTURE SURGERY Left    LEFT HEART CATH AND CORONARY ANGIOGRAPHY N/A 01/06/2017   Procedure: Left Heart Cath and Coronary Angiography;  Surgeon: Wellington Hampshire, MD;  Location: [facility];  Service: Cardiovascular;  Laterality: N/A;   RIGHT/LEFT HEART CATH AND CORONARY ANGIOGRAPHY N/A 06/13/2020   Procedure: RIGHT/LEFT HEART CATH AND CORONARY ANGIOGRAPHY;  Surgeon: Nelva Bush, MD;  Location: [place] [facility];  Service: Cardiovascular;  Laterality: N/A;   Family History  Problem Relation Age of Onset   Diabetes Mother    Diabetes Mellitus II Mother    Hypothyroidism Mother    Hypertension Mother    Kidney failure Mother        Dialysis   Heart attack Mother        41 yo approximately   Hypertension Father    Gout Father    Cancer Maternal Grandfather    Diabetes Maternal Grandfather    Cancer Paternal Aunt    Social History   Tobacco Use   Smoking status: Former    Packs/day: 0.50    Years: 33.00    Total pack years: 16.50    Types: Cigarettes    Quit date: 05/08/2020    Years  since quitting: 1.9   Smokeless tobacco: Former    Quit date: 05/08/2020   Tobacco comments:    Quit Sept 2021  Substance Use Topics   Alcohol use: Not Currently    Comment: occassionally   Allergies  Allergen Reactions   Metformin And Related Nausea And Vomiting   Prednisone Other (See Comments)    Reaction: Hallucinations     Zantac [Ranitidine Hcl] Diarrhea and Nausea Only    Night sweats   Prior to Admission medications   Medication Sig Start Date End Date Taking? Authorizing Provider  carvedilol (COREG) 3.125 MG tablet Take 1 tablet (3.125 mg total) by mouth 2 (two) times daily with a meal. 02/14/22  Yes Theora Gianotti, NP  dapagliflozin propanediol (FARXIGA) 10 MG TABS tablet Take 1 tablet (10 mg total) by mouth daily. 04/23/22  Yes Nehemie Casserly A, FNP  Dulaglutide (TRULICITY) 3 GE/9.5MW SOPN Inject 3 mg as directed once a week. 02/08/22  Yes [name], FNP  fenofibrate (TRICOR) 145 MG tablet Take 1 tablet (145 mg total) by mouth daily. 01/15/22  Yes Amee Boothe, Otila Kluver A, FNP  FLUoxetine (PROZAC) 20 MG capsule Take 1 capsule (20 mg total) by mouth daily. 02/08/22  Yes [name], FNP  insulin aspart (NOVOLOG) 100 UNIT/ML FlexPen Inject 3 units into the skin before breakfast and lunch and 5 units before dinner. 01/21/22  Yes Swayze, Ava, DO  Insulin Glargine (BASAGLAR KWIKPEN) 100 UNIT/ML Inject 36 Units into the skin at bedtime. 01/19/22 02/09/23 Yes Swayze, Ava, DO  Insulin Pen Needle (PEN NEEDLES) 32G X 4 MM MISC 200 each by Does not apply route at bedtime. 09/05/21  Yes Tally Joe T, FNP  potassium chloride SA (KLOR-CON M20) 20 MEQ tablet Take 1 tablet daily Patient taking differently: 20 mEq every other day. 01/15/22  Yes Alisa Graff, FNP  rivaroxaban (XARELTO) 20 MG TABS tablet Take 1 tablet (20 mg total) by mouth daily with supper. 01/15/22  Yes [name] A, FNP  rosuvastatin (CRESTOR) 40 MG tablet Take 1 tablet (40 mg total) by mouth daily. 04/01/22  Yes Gollan,  Kathlene November, MD  sacubitril-valsartan (ENTRESTO) 24-26 MG Take 1 tablet by mouth 2 (two) times daily. 12/20/21  Yes [name] A, FNP  spironolactone (ALDACTONE) 25 MG tablet Take 0.5 tablets (12.5 mg total) by mouth daily. Patient taking differently: Take 25 mg by mouth daily. 04/01/22  Yes Minna Merritts, MD  torsemide (DEMADEX) 20 MG tablet Take 2 tablets (40 mg total) by mouth 2 (two) times daily. 01/15/22  Yes Lamari Beckles A, FNP  Ipratropium-Albuterol (COMBIVENT RESPIMAT) 20-100 MCG/ACT AERS respimat Inhale 1 puff into the lungs every 6 (six) hours as needed for wheezing. Patient not taking: Reported on 04/23/2022    [provider]  nitroGLYCERIN (NITROSTAT) 0.4 MG SL tablet Place 1 tablet (0.4 mg total) under the tongue every 5 (five) minutes x 3 doses as needed for chest pain. Patient not taking: Reported on 04/24/2022 06/09/20   Lorella Nimrod, MD  sucralfate (CARAFATE) 1 g tablet TAKE 1 TABLET BY MOUTH 4 TIMES DAILY WITH MEALS AND AT BEDTIME Patient not taking: Reported on 04/24/2022 03/26/22     traZODone (DESYREL) 100 MG tablet Take 1 tablet (100 mg total) by mouth at bedtime. Patient not taking: Reported on 04/23/2022 02/08/22   Tally Joe T, FNP  insulin lispro (HUMALOG) 100 UNIT/ML KwikPen Inject 3 units before breakfast and lunch. 5 units before dinner. 01/19/22 01/21/22  Swayze, Ava, DO   Review of Systems  Constitutional:  Positive for fatigue. Negative for appetite change.  HENT:  Negative for congestion, postnasal drip and sore throat.   Eyes: Negative.   Respiratory:  Positive for shortness of breath. Negative for cough and wheezing.   Cardiovascular:  Positive for chest pain (unchanged). Negative for palpitations and leg swelling.  Gastrointestinal:  Positive for abdominal distention, abdominal pain and diarrhea.  Endocrine: Negative.   Genitourinary: Negative.   Musculoskeletal:  Positive for arthralgias (right knee and hips). Negative for back pain and neck pain.  Skin:  Negative.   Allergic/Immunologic: Negative.   Neurological:  Positive for dizziness and headaches (chronic across forehead). Negative for light-headedness.  Hematological:  Negative for adenopathy. Does not bruise/bleed easily.  Psychiatric/Behavioral:  Positive for dysphoric mood. Negative for sleep disturbance. The patient is not nervous/anxious.  Vitals:   04/25/22 0814  BP: 139/78  Pulse: 93  Resp: 14  SpO2: 99%  Weight: 242 lb 2 oz (109.8 kg)  Height: 6' (1.829 m)   Wt Readings from Last 3 Encounters:  04/25/22 242 lb 2 oz (109.8 kg)  04/23/22 245 lb 4 oz (111.2 kg)  04/17/22 240 lb (108.9 kg)   Lab Results  Component Value Date   CREATININE 1.17 04/23/2022   CREATININE 1.77 (H) 03/01/2022   CREATININE 1.46 (H) 02/08/2022   Physical Exam Vitals and nursing note reviewed.  Constitutional:      Appearance: He is well-developed.  HENT:     Head: Normocephalic and atraumatic.  Cardiovascular:     Rate and Rhythm: Normal rate and regular rhythm.  Pulmonary:     Effort: Pulmonary effort is normal. No accessory muscle usage.     Breath sounds: No wheezing, rhonchi or rales.  Abdominal:     General: There is distension.     Palpations: Abdomen is soft.     Tenderness: There is no abdominal tenderness.  Musculoskeletal:     Right lower leg: No tenderness. No edema.     Left lower leg: No tenderness. No edema.  Skin:    General: Skin is warm and dry.  Neurological:     General: No focal deficit present.     Mental Status: He is alert and oriented to person, place, and time.  Psychiatric:        Mood and Affect: Mood normal.        Behavior: Behavior normal.    Assessment & Plan:   1: Chronic heart failure with reduced ejection fraction- - NYHA class II - euvolemic today - weighing daily and reminded to call for an overnight weight gain of >2 pounds or a weekly weight gain of >5 pounds - weight down 3 pounds from last visit here 2 days ago  - took 2 doses of  furoscix along with potassium supplements and does feel like his abdomen is softer - will check BMP today - not adding salt and he was encouraged to closely follow a '2000mg'$  sodium diet - saw cardiology Barbarann Ehlers) 02/14/22 - on GDMT of carvedilol, farxiga, spironolactone & entresto  - saw EP Quentin Ore) 04/17/22; ICD to be placed 05/20/22 - [facility] - BNP from 12/04/21 was 243.0   2: HTN- - BP slightly elevated although improved (139/78) from last visit 2 days ago - saw PCP Rollene Rotunda) 03/26/22 - BMP from 03/26/22 reviewed and showed sodium 140, potassium 4.2, creatinine 1.3 and GFR 66   3: Diabetes-   -  A1c 9.8% on 02/08/22 - hasn't been able to check his glucose at home because he can't afford the test strips - saw nephrology (Korrapati) 03/26/22  4: Right knee pain- - knee pain slightly better today - referral to [facility] placed at last visit and has appointment with them tomorrow - has tried wearing a knee brace but without success  5: Depression- - patient reports depression due to his lack of finances - lives with his dad who is also on a fixed income and his dad had to borrow money recently so patient could get his medications - feels like if he could get approved for disability, then his mental health would improve because then he would have money coming in - Education officer, museum from [facility] has reached out to patient and gave him information regarding a pharmacy that will waive his  medicaid copays so that he can get his medication - he is going to check into that     Patient did not bring his medications nor a list. Each medication was verbally reviewed with the patient and he was encouraged to bring the bottles to every visit to confirm accuracy of list.   Return in 3 months, sooner if needed.

## 2022-04-25 NOTE — Progress Notes (Signed)
Care Coordination Call  Contacted patient. He confirms that he spoke to [facility] and they are going to be able to waive his Medicaid copays for him when he goes to pick up his medications. He is having medications transferred to [facility].   He picked up samples of Ozempic from [facility] today. Discussed with PCP that I recommend we continue Trulicity 3 mg weekly for his remaining 2 weeks of supply, then start Ozempic at 0.25 mg weekly for only 2 weeks, then increase to 0.5 mg weekly. PCP was in agreement. I counseled patient on this, he opened up his box of Ozempic and looked at the pen while I explained the dosing. He verbalizes understanding.   Will follow up with Medicaid on [place] PA and Ozempic PA tomorrow.   Catie Hedwig Morton, PharmD, [facility] [phone]

## 2022-04-25 NOTE — Telephone Encounter (Signed)
Called patient to let him know we got him set up with an [facility] appointment after patient request he was having problems and we put a referral in for 9/8 at [place], NT

## 2022-04-25 NOTE — Progress Notes (Signed)
Care Coordination Call  Communicated with Tally Joe, NP. PA submitted for Libre 3 CGM and for Ozempic 0.25/0.5 mg. Will follow up with patient's insurance in 2-5 business days.   Catie Hedwig Morton, PharmD, [facility] [phone]

## 2022-04-25 NOTE — Patient Instructions (Signed)
Continue weighing daily and call for an overnight weight gain of 3 pounds or more or a weekly weight gain of more than 5 pounds  If you have voicemail, please make sure your mailbox is cleaned out so that we may leave a message and please make sure to listen to any voicemails.    If you receive a satisfaction survey regarding the [facility], please take the time to fill it out. This way we can continue to provide excellent care and make any changes that need to be made.     

## 2022-04-26 ENCOUNTER — Other Ambulatory Visit: Payer: Self-pay | Admitting: Pharmacist

## 2022-04-26 DIAGNOSIS — M25561 Pain in right knee: Secondary | ICD-10-CM | POA: Diagnosis not present

## 2022-04-26 DIAGNOSIS — M2392 Unspecified internal derangement of left knee: Secondary | ICD-10-CM | POA: Diagnosis not present

## 2022-04-26 DIAGNOSIS — M25562 Pain in left knee: Secondary | ICD-10-CM | POA: Diagnosis not present

## 2022-04-26 DIAGNOSIS — M2391 Unspecified internal derangement of right knee: Secondary | ICD-10-CM | POA: Diagnosis not present

## 2022-04-26 NOTE — Progress Notes (Signed)
Care Coordination Call  Called patient's Medicaid insurance to follow up on status of PA for [facility]. They did not process the PA as they see that he has two insurance plans, one with [employer] as well as [employer]. We only have one pharmacy benefits plan on our end. Will discuss with patient next week.   Catie Hedwig Morton, PharmD, [facility] [phone]

## 2022-04-29 ENCOUNTER — Telehealth: Payer: Self-pay | Admitting: Family

## 2022-04-29 NOTE — Telephone Encounter (Signed)
LVM with patient notifiying him that he was denied patient assistance for [facility] as he has medicaid and cannot have any commercial or [employer] except Medicare to be considered.   Ricci Paff, NT

## 2022-05-08 ENCOUNTER — Telehealth: Payer: Self-pay | Admitting: Family

## 2022-05-08 ENCOUNTER — Telehealth: Payer: Self-pay | Admitting: Pharmacist

## 2022-05-08 NOTE — Telephone Encounter (Signed)
Called and LVM with patient that his prior auth for farxiga has now been approved and he should be able to go get from the pharmacy.   Onyinyechi Huante, NT

## 2022-05-08 NOTE — Progress Notes (Unsigned)
Established patient visit   Patient: Gabriel [name].   DOB: [birth year]   51 y.o. Male  MRN: [id number] Visit Date: 05/09/2022  Today's healthcare provider: [name], FNP  Re Introduced to nurse practitioner role and practice setting.  All questions answered.  Discussed provider/patient relationship and expectations.  I,Cuthbert Turton J Khalia Gong,acting as a scribe for [name], FNP.,have documented all relevant documentation on the behalf of [name], FNP,as directed by  [name], FNP while in the presence of [name], FNP.   Chief Complaint  Patient presents with   Diabetes   Subjective    HPI  Diabetes Mellitus Type II, Follow-up  Lab Results  Component Value Date   HGBA1C 7.6 (A) 05/09/2022   HGBA1C 9.8 (A) 02/08/2022   HGBA1C 8.7 (H) 11/24/2021   Wt Readings from Last 3 Encounters:  05/09/22 237 lb (107.5 kg)  04/25/22 242 lb 2 oz (109.8 kg)  04/23/22 245 lb 4 oz (111.2 kg)   Last seen for diabetes 3 months ago.  Management since then includes increase trulicity to '3mg'$ /week. He reports excellent compliance with treatment. He is not having side effects.  Symptoms: Yes fatigue No foot ulcerations  No appetite changes Yes nausea  No paresthesia of the feet  Yes polydipsia  Yes polyuria No visual disturbances   No vomiting     Home blood sugar records: not checked  Episodes of hypoglycemia? Yes dizziness, sweating   Current insulin regiment: Humalog and basaglar Most Recent Eye Exam: Sept 1, 2023 Current exercise: none Current diet habits: well balanced  Pertinent Labs: Lab Results  Component Value Date   CHOL 110 11/25/2021   HDL 17 (L) 11/25/2021   LDLCALC 62 11/25/2021   LDLDIRECT 60.6 11/23/2020   TRIG 153 (H) 11/25/2021   CHOLHDL 6.5 11/25/2021   Lab Results  Component Value Date   NA 139 04/25/2022   K 4.3 04/25/2022   CREATININE 1.56 (H) 04/25/2022   GFRNONAA 53 (L) 04/25/2022   LABMICR 6.4 02/08/2022      ---------------------------------------------------------------------------------------------------   Medications: Outpatient Medications Prior to Visit  Medication Sig   carvedilol (COREG) 3.125 MG tablet Take 1 tablet (3.125 mg total) by mouth 2 (two) times daily with a meal.   dapagliflozin propanediol (FARXIGA) 10 MG TABS tablet Take 1 tablet (10 mg total) by mouth daily.   Dulaglutide (TRULICITY) 3 NW/2.9FA SOPN Inject 3 mg as directed once a week.   fenofibrate (TRICOR) 145 MG tablet Take 1 tablet (145 mg total) by mouth daily.   FLUoxetine (PROZAC) 20 MG capsule Take 1 capsule (20 mg total) by mouth daily.   insulin aspart (NOVOLOG) 100 UNIT/ML FlexPen Inject 3 units into the skin before breakfast and lunch and 5 units before dinner.   Insulin Glargine (BASAGLAR KWIKPEN) 100 UNIT/ML Inject 36 Units into the skin at bedtime.   Ipratropium-Albuterol (COMBIVENT RESPIMAT) 20-100 MCG/ACT AERS respimat Inhale 1 puff into the lungs every 6 (six) hours as needed for wheezing.   nitroGLYCERIN (NITROSTAT) 0.4 MG SL tablet Place 1 tablet (0.4 mg total) under the tongue every 5 (five) minutes x 3 doses as needed for chest pain.   potassium chloride SA (KLOR-CON M20) 20 MEQ tablet Take 1 tablet daily (Patient taking differently: 20 mEq every other day.)   rivaroxaban (XARELTO) 20 MG TABS tablet Take 1 tablet (20 mg total) by mouth daily with supper.   rosuvastatin (CRESTOR) 40 MG tablet Take 1 tablet (  40 mg total) by mouth daily.   sacubitril-valsartan (ENTRESTO) 24-26 MG Take 1 tablet by mouth 2 (two) times daily.   spironolactone (ALDACTONE) 25 MG tablet Take 1 tablet (25 mg total) by mouth daily.   torsemide (DEMADEX) 20 MG tablet Take 2 tablets (40 mg total) by mouth 2 (two) times daily.   [DISCONTINUED] Insulin Pen Needle (PEN NEEDLES) 32G X 4 MM MISC 200 each by Does not apply route at bedtime.   sucralfate (CARAFATE) 1 g tablet TAKE 1 TABLET BY MOUTH 4 TIMES DAILY WITH MEALS AND AT BEDTIME  (Patient not taking: Reported on 05/09/2022)   traZODone (DESYREL) 100 MG tablet Take 1 tablet (100 mg total) by mouth at bedtime. (Patient not taking: Reported on 05/09/2022)   No facility-administered medications prior to visit.    Review of Systems    Objective    BP 109/63 (BP Location: Right Arm, Patient Position: Sitting, Cuff Size: Large)   Pulse 86   Temp 97.7 F (36.5 C)   Resp 17   Ht 6' (1.829 m)   Wt 237 lb (107.5 kg)   SpO2 99%   BMI 32.14 kg/m   Physical Exam Vitals and nursing note reviewed.  Constitutional:      Appearance: Normal appearance. He is obese.  HENT:     Head: Normocephalic and atraumatic.  Eyes:     Pupils: Pupils are equal, round, and reactive to light.  Cardiovascular:     Rate and Rhythm: Normal rate and regular rhythm.     Pulses: Normal pulses.     Heart sounds: Normal heart sounds.  Pulmonary:     Effort: Pulmonary effort is normal.     Breath sounds: Normal breath sounds.  Abdominal:     General: Bowel sounds are normal. There is no distension.     Palpations: Abdomen is soft.  Musculoskeletal:        General: Normal range of motion.     Cervical back: Normal range of motion.  Skin:    General: Skin is warm and dry.     Capillary Refill: Capillary refill takes less than 2 seconds.  Neurological:     General: No focal deficit present.     Mental Status: He is alert and oriented to person, place, and time. Mental status is at baseline.  Psychiatric:        Mood and Affect: Mood normal.        Behavior: Behavior normal.        Thought Content: Thought content normal.        Judgment: Judgment normal.      Results for orders placed or performed in visit on 05/09/22  POCT glycosylated hemoglobin (Hb A1C)  Result Value Ref Range   Hemoglobin A1C 7.6 (A) 4.0 - 5.6 %   Est. average glucose Bld gHb Est-mCnc 171   HM DIABETES EYE EXAM  Result Value Ref Range   HM Diabetic Eye Exam No Retinopathy No Retinopathy    Assessment &  Plan     Problem List Items Addressed This Visit       Cardiovascular and Mediastinum   Chronic HFrEF (heart failure with reduced ejection fraction) ([place]) - Primary    Chronic, stable Reports use of furoscix 9/20 after weight hit 241# Consult with Cardiology/Heart Failure NP- [name] about POC and continued monitoring of symptoms NP Hack[place] plans to reach out to patient 9/22 Encouraged PO intake and regular meals Discussed emergent care if symptoms worsen or change  following furoscix use with SOB/DOE, borderline hypotension and fatigue         Endocrine   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, with long-term current use of insulin ([facility])    Chronic, stable Labile fluid balance requiring use of furoscix 9/20 Consult with Catie [facility] Pharm D about transition to ozempic and PA missing for CGM Catie plans to reach out to Mr Redfield in 1-2 weeks and will call insurance company to check on CGM coverage as PA has been delayed       Relevant Orders   POCT glycosylated hemoglobin (Hb A1C) (Completed)   Type 2 diabetes mellitus with hyperlipidemia ([place])    Chronic, improved Continue transition to ozempic next week now that trulicity is completed Continue crestor 40 mg Patient notes working on diet to assist with heart and kidney health        Relevant Medications   Insulin Pen Needle (PEN NEEDLES) 32G X 4 MM MISC     Other   Need for influenza vaccination    Consented; VIS made available; no immediate side effects following administration; plan to repeat annually       Relevant Orders   Flu Vaccine QUAD 6+ mos PF IM (Fluarix Quad PF) (Completed)     Return in about 3 months (around 08/08/2022) for chonic disease management.      Vonna Kotyk, FNP, have reviewed all documentation for this visit. The documentation on 05/09/22 for the exam, diagnosis, procedures, and orders are all accurate and complete.  Billing  reflects consult with Catie Drucie Opitz and [name] FNP regarding POC and fluid management given recent use of furoscix.  [name], [place] [phone] (phone) [phone] (fax)  [place]

## 2022-05-08 NOTE — Telephone Encounter (Signed)
Called Med insurance (Medicaid [employer]) for [employer]. Per insurance representative records indicate active [employer] for this patient. Case was opened to remove current [employer] from record prior to complete prior-authorization.  Mr Lupe was informed of above and encouraged to call insurance members service at [phone] to request expedite review.  Mr Klingensmith wants to wait for patient assistance program answer before further action from his insurance.  Shanard Treto Rodriguez-Guzman PharmD, BCPS 05/08/2022 7:35 AM

## 2022-05-08 NOTE — Telephone Encounter (Signed)
Additional insurance was removed from patient profile.  Prior-authorization for Farxiga submitted 05/08/2022.  Ebony Hail representative from [place] IY#[id number] [place] '10mg'$  daily #30 Refill 12

## 2022-05-09 ENCOUNTER — Encounter: Payer: Self-pay | Admitting: Family Medicine

## 2022-05-09 ENCOUNTER — Other Ambulatory Visit: Payer: Self-pay | Admitting: Family

## 2022-05-09 ENCOUNTER — Ambulatory Visit (INDEPENDENT_AMBULATORY_CARE_PROVIDER_SITE_OTHER): Payer: Medicaid Other | Admitting: Family Medicine

## 2022-05-09 ENCOUNTER — Telehealth: Payer: Self-pay

## 2022-05-09 VITALS — BP 109/63 | HR 86 | Temp 97.7°F | Resp 17 | Ht 72.0 in | Wt 237.0 lb

## 2022-05-09 DIAGNOSIS — E785 Hyperlipidemia, unspecified: Secondary | ICD-10-CM

## 2022-05-09 DIAGNOSIS — E1169 Type 2 diabetes mellitus with other specified complication: Secondary | ICD-10-CM

## 2022-05-09 DIAGNOSIS — E1122 Type 2 diabetes mellitus with diabetic chronic kidney disease: Secondary | ICD-10-CM | POA: Diagnosis not present

## 2022-05-09 DIAGNOSIS — I5022 Chronic systolic (congestive) heart failure: Secondary | ICD-10-CM | POA: Diagnosis not present

## 2022-05-09 DIAGNOSIS — I509 Heart failure, unspecified: Secondary | ICD-10-CM | POA: Diagnosis not present

## 2022-05-09 DIAGNOSIS — Z794 Long term (current) use of insulin: Secondary | ICD-10-CM | POA: Diagnosis not present

## 2022-05-09 DIAGNOSIS — I5023 Acute on chronic systolic (congestive) heart failure: Secondary | ICD-10-CM

## 2022-05-09 DIAGNOSIS — E0822 Diabetes mellitus due to underlying condition with diabetic chronic kidney disease: Secondary | ICD-10-CM

## 2022-05-09 DIAGNOSIS — I10 Essential (primary) hypertension: Secondary | ICD-10-CM | POA: Diagnosis not present

## 2022-05-09 DIAGNOSIS — I2510 Atherosclerotic heart disease of native coronary artery without angina pectoris: Secondary | ICD-10-CM | POA: Diagnosis not present

## 2022-05-09 DIAGNOSIS — E668 Other obesity: Secondary | ICD-10-CM | POA: Diagnosis not present

## 2022-05-09 DIAGNOSIS — J449 Chronic obstructive pulmonary disease, unspecified: Secondary | ICD-10-CM | POA: Diagnosis not present

## 2022-05-09 DIAGNOSIS — Z23 Encounter for immunization: Secondary | ICD-10-CM | POA: Diagnosis not present

## 2022-05-09 DIAGNOSIS — N1832 Chronic kidney disease, stage 3b: Secondary | ICD-10-CM | POA: Diagnosis not present

## 2022-05-09 LAB — POCT GLYCOSYLATED HEMOGLOBIN (HGB A1C)
Est. average glucose Bld gHb Est-mCnc: 171
Hemoglobin A1C: 7.6 % — AB (ref 4.0–5.6)

## 2022-05-09 MED ORDER — PEN NEEDLES 32G X 4 MM MISC: 200.0000 | Freq: Every day | 3 refills | Status: DC

## 2022-05-09 MED ORDER — DAPAGLIFLOZIN PROPANEDIOL 10 MG PO TABS: 10.0000 mg | ORAL_TABLET | Freq: Every day | ORAL | 5 refills | Status: DC

## 2022-05-09 NOTE — Telephone Encounter (Signed)
Patient called to report that he fell outside of his home today. He reports feeling weak,dizzy and short of breath. He denies any injuries from his fall. Patient took furoscix injection yesterday and had 4 pound weight loss.  Patient was already seen by PCP this AM who noted these issues and was in touch with [name], FNP. Per [name] instructions: Patient was advised to follow instructions from PCP to rest and increase oral intake today. If patient's shortness of breath, dizziness or other symptoms worsen, patient should seek help in the emergency room.   Patient states that he will plan to rest at home and hydrate today and he asks that [facility] call him to follow up when she is available.  Provider notified. Georg Ruddle, RN

## 2022-05-09 NOTE — Assessment & Plan Note (Signed)
Consented; VIS made available; no immediate side effects following administration; plan to repeat annually   

## 2022-05-09 NOTE — Assessment & Plan Note (Signed)
Chronic, stable Reports use of furoscix 9/20 after weight hit 241# Consult with Cardiology/Heart Failure NP- [name] about POC and continued monitoring of symptoms NP Hack[place] plans to reach out to patient 9/22 Encouraged PO intake and regular meals Discussed emergent care if symptoms worsen or change following furoscix use with SOB/DOE, borderline hypotension and fatigue

## 2022-05-09 NOTE — Assessment & Plan Note (Signed)
Chronic, improved Continue transition to ozempic next week now that trulicity is completed Continue crestor 40 mg Patient notes working on diet to assist with heart and kidney health

## 2022-05-09 NOTE — Assessment & Plan Note (Signed)
Chronic, stable Labile fluid balance requiring use of furoscix 9/20 Consult with Catie [facility] Pharm D about transition to ozempic and PA missing for CGM Catie plans to reach out to Gabriel [name] in 1-2 weeks and will call insurance company to check on CGM coverage as PA has been delayed

## 2022-05-10 ENCOUNTER — Telehealth: Payer: Self-pay

## 2022-05-10 NOTE — Telephone Encounter (Signed)
Called patient to check in and assess how he was after fall yesterday.  Patient states he was able to rest and eat and drink well yesterday, and states he is feeling normal so far today.  Advised patient to monitor blood pressure at home and to call us back today if he begins to experience symptoms again. If he experiences extreme dizziness, new weakness, or fainting he should seek emergency treatment. Patient states he will check his blood pressure at least once daily and states back when to call the doctor and when to seek emergency treatment.  He did not feel he needed a sooner appointment at the [facility] at this time. Provider notified.  Georg Ruddle, RN

## 2022-05-13 ENCOUNTER — Observation Stay
Admission: EM | Admit: 2022-05-13 | Discharge: 2022-05-15 | Disposition: A | Discharge status: Home / Self Care | Payer: Medicaid Other | Attending: Family Medicine | Admitting: Family Medicine

## 2022-05-13 ENCOUNTER — Emergency Department: Payer: Medicaid Other

## 2022-05-13 ENCOUNTER — Other Ambulatory Visit: Payer: Self-pay

## 2022-05-13 ENCOUNTER — Telehealth: Payer: Self-pay | Admitting: Family Medicine

## 2022-05-13 ENCOUNTER — Ambulatory Visit: Payer: Self-pay

## 2022-05-13 DIAGNOSIS — E1122 Type 2 diabetes mellitus with diabetic chronic kidney disease: Secondary | ICD-10-CM | POA: Diagnosis not present

## 2022-05-13 DIAGNOSIS — Z87891 Personal history of nicotine dependence: Secondary | ICD-10-CM | POA: Insufficient documentation

## 2022-05-13 DIAGNOSIS — Z79899 Other long term (current) drug therapy: Secondary | ICD-10-CM | POA: Diagnosis not present

## 2022-05-13 DIAGNOSIS — E1165 Type 2 diabetes mellitus with hyperglycemia: Secondary | ICD-10-CM

## 2022-05-13 DIAGNOSIS — I5043 Acute on chronic combined systolic (congestive) and diastolic (congestive) heart failure: Secondary | ICD-10-CM | POA: Diagnosis not present

## 2022-05-13 DIAGNOSIS — N179 Acute kidney failure, unspecified: Secondary | ICD-10-CM | POA: Diagnosis not present

## 2022-05-13 DIAGNOSIS — I2510 Atherosclerotic heart disease of native coronary artery without angina pectoris: Secondary | ICD-10-CM | POA: Insufficient documentation

## 2022-05-13 DIAGNOSIS — E1169 Type 2 diabetes mellitus with other specified complication: Secondary | ICD-10-CM | POA: Insufficient documentation

## 2022-05-13 DIAGNOSIS — R918 Other nonspecific abnormal finding of lung field: Secondary | ICD-10-CM | POA: Diagnosis not present

## 2022-05-13 DIAGNOSIS — R55 Syncope and collapse: Principal | ICD-10-CM

## 2022-05-13 DIAGNOSIS — Z794 Long term (current) use of insulin: Secondary | ICD-10-CM | POA: Diagnosis not present

## 2022-05-13 DIAGNOSIS — E785 Hyperlipidemia, unspecified: Secondary | ICD-10-CM | POA: Diagnosis not present

## 2022-05-13 DIAGNOSIS — Z7901 Long term (current) use of anticoagulants: Secondary | ICD-10-CM | POA: Insufficient documentation

## 2022-05-13 DIAGNOSIS — J449 Chronic obstructive pulmonary disease, unspecified: Secondary | ICD-10-CM | POA: Diagnosis not present

## 2022-05-13 DIAGNOSIS — R42 Dizziness and giddiness: Secondary | ICD-10-CM | POA: Diagnosis not present

## 2022-05-13 DIAGNOSIS — N1832 Chronic kidney disease, stage 3b: Secondary | ICD-10-CM | POA: Insufficient documentation

## 2022-05-13 DIAGNOSIS — Z7985 Long-term (current) use of injectable non-insulin antidiabetic drugs: Secondary | ICD-10-CM | POA: Insufficient documentation

## 2022-05-13 DIAGNOSIS — I130 Hypertensive heart and chronic kidney disease with heart failure and stage 1 through stage 4 chronic kidney disease, or unspecified chronic kidney disease: Secondary | ICD-10-CM | POA: Diagnosis not present

## 2022-05-13 DIAGNOSIS — I10 Essential (primary) hypertension: Secondary | ICD-10-CM | POA: Diagnosis present

## 2022-05-13 DIAGNOSIS — E782 Mixed hyperlipidemia: Secondary | ICD-10-CM | POA: Diagnosis present

## 2022-05-13 LAB — CBC WITH DIFFERENTIAL/PLATELET
Abs Immature Granulocytes: 0.05 10*3/uL (ref 0.00–0.07)
Basophils Absolute: 0.1 10*3/uL (ref 0.0–0.1)
Basophils Relative: 1 %
Eosinophils Absolute: 0.1 10*3/uL (ref 0.0–0.5)
Eosinophils Relative: 1 %
HCT: 47.9 % (ref 39.0–52.0)
Hemoglobin: 16.6 g/dL (ref 13.0–17.0)
Immature Granulocytes: 1 %
Lymphocytes Relative: 27 %
Lymphs Abs: 2.6 10*3/uL (ref 0.7–4.0)
MCH: 29.1 pg (ref 26.0–34.0)
MCHC: 34.7 g/dL (ref 30.0–36.0)
MCV: 83.9 fL (ref 80.0–100.0)
Monocytes Absolute: 0.7 10*3/uL (ref 0.1–1.0)
Monocytes Relative: 7 %
Neutro Abs: 6.2 10*3/uL (ref 1.7–7.7)
Neutrophils Relative %: 63 %
Platelets: 182 10*3/uL (ref 150–400)
RBC: 5.71 MIL/uL (ref 4.22–5.81)
RDW: 12.9 % (ref 11.5–15.5)
WBC: 9.8 10*3/uL (ref 4.0–10.5)
nRBC: 0 % (ref 0.0–0.2)

## 2022-05-13 LAB — BASIC METABOLIC PANEL
Anion gap: 13 (ref 5–15)
BUN: 38 mg/dL — ABNORMAL HIGH (ref 6–20)
CO2: 25 mmol/L (ref 22–32)
Calcium: 10.1 mg/dL (ref 8.9–10.3)
Chloride: 99 mmol/L (ref 98–111)
Creatinine, Ser: 1.59 mg/dL — ABNORMAL HIGH (ref 0.61–1.24)
GFR, Estimated: 52 mL/min — ABNORMAL LOW (ref >60)
Glucose, Bld: 304 mg/dL — ABNORMAL HIGH (ref 70–99)
Potassium: 4.2 mmol/L (ref 3.5–5.1)
Sodium: 137 mmol/L (ref 135–145)

## 2022-05-13 LAB — TROPONIN I (HIGH SENSITIVITY): Troponin I (High Sensitivity): 32 ng/L — ABNORMAL HIGH (ref <18)

## 2022-05-13 LAB — CBG MONITORING, ED: Glucose-Capillary: 281 mg/dL — ABNORMAL HIGH (ref 70–99)

## 2022-05-13 MED ORDER — SODIUM CHLORIDE 0.9 % IV BOLUS
500.0000 mL | Freq: Once | INTRAVENOUS | Status: AC
  Administered 2022-05-13: 500 mL via INTRAVENOUS

## 2022-05-13 NOTE — ED Provider Notes (Incomplete)
[facility] Provider Note    Event Date/Time   First MD Initiated Contact with Patient 05/13/22 2300     (approximate)   History   Loss of Consciousness   HPI  Gabriel [name]. is a 51 y.o. male who presents to the ED from home with a chief complaint of dizziness and syncope.  With a history of CAD, COPD, CKD, diabetes, CHF, hypertension, nonischemic cardiomyopathy who is scheduled for AICD placement next month who has been experiencing dizziness with frequent falls for the past week.  Saw his cardiologist who thought he was over diuresed, cut back on his diuretic and recommended hydration.  Patient states he got dizzy again today and this time he had syncopal episode.  Endorses chronic nausea and diarrhea times several months.  Denies changes, headache, chest pain, shortness of breath, abdominal pain or vomiting.     Past Medical History   Past Medical History:  Diagnosis Date  . CAD (coronary artery disease)    a. 04/2015 low risk MV;  b. 12/2016 Cath: minor irregs in LAD/Diag/LCX/OM, RCA 40p/m/d; c. 05/2020 Cath: LM nl, LAD 50d, LCX 30p, OM1 40, RCA 100p w/ L->R collats. CO/CI 3.1/1.3-->Med rx.  . Chest wall pain, chronic   . Chronic Troponin Elevation   . CKD (chronic kidney disease), stage II-III   . COPD (chronic obstructive pulmonary disease) ([place])   . Diabetes mellitus without complication ([place])   . HFrEF (heart failure with reduced ejection fraction) ([place])    a. 03/2015 Echo: EF 45-50%; b. 12/2015 Echo: EF 20-25%; c. 02/2016 Echo: EF 30-35%; d. 11/2016 Echo: EF 40-45%; e. 06/2019 Echo: EF 30-35%; f. 11/2019 Echo: EF 25-30%; g. 05/2020 Echo: EF 35-40%; h. 05/2021 Echo: EF 40-45%; i. 11/2021 Echo: EF 20-25%, glob HK, mild LVH, GrIII DD, sev red RV fxn, mild MR.  Marland Kitchen Hypertension   . Mitral regurgitation    Mild to moderate by October 2021 echocardiogram.  . Mixed Ischemic & NICM (nonischemic cardiomyopathy) ([place])    a. 03/2015 Echo: EF 45-50%; b. 12/2015  Echo: EF 20-25%; c. 02/2016 Echo: EF 30-35%; d. 11/2016 Echo: EF 40-45%; e. 06/2019 Echo: EF 30-35%; f. 11/2019 Echo: EF 25-30%; g. 05/2020 Echo: EF 35-40%; h. 05/2021 Echo: EF 40-45%; i. 11/2021 Echo: EF 20-25%  . Myocardial infarct ([place])   . NSVT (nonsustained ventricular tachycardia) ([place])    a. 12/2015 noted on tele-->amio;  b. 12/2015 Event monitor: no VT noted.  . Obesity (BMI 30.0-34.9)   . Psoriasis   . 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  12/31/19: RUL and RLL PEs.  . Syncope    a. 01/2016 - felt to be vasovagal.     Active Problem List   Patient Active Problem List   Diagnosis Date Noted  . Need for influenza vaccination 05/09/2022  . Abdominal pain 03/26/2022  . Patient cannot afford medications 03/26/2022  . 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, with long-term current use of insulin ([place]) 02/08/2022  . Depression, recurrent ([place]) 02/08/2022  . Chronic tension-type headache, not intractable 02/08/2022  . Acute kidney injury superimposed on chronic kidney disease ([place]) 01/18/2022  . Dyslipidemia 01/18/2022  . Demand ischemia ([place])   . Gastroenteritis 11/25/2021  . SIRS (systemic inflammatory response syndrome) ([place]) 11/25/2021  . Abnormal LFTs 11/24/2021  . Pulmonary embolism ([place]) 11/24/2021  . Acute on chronic systolic congestive heart failure ([place])   . Annual physical exam  11/05/2021  . Screening for malignant neoplasm of prostate 11/05/2021  . Elevated serum creatinine 11/05/2021  . Difficulty attaining erection 11/05/2021  . Chronic HFrEF (heart failure with reduced ejection fraction) ([place]) 11/05/2021  . Epistaxis 09/27/2021  . CAD (coronary artery disease) 09/27/2021  . Right foot pain 09/05/2021  . Arch pain of right foot 09/05/2021  . Elevated triglycerides with high cholesterol 09/05/2021  . Memory loss or impairment 09/05/2021  . Hyperlipidemia associated with type 2 diabetes  mellitus ([place]) 09/05/2021  . Hospital discharge follow-up 09/05/2021  . Dyspnea 08/10/2021  . Acute on chronic systolic (congestive) heart failure ([place]) 08/09/2021  . Type II or unspecified type diabetes mellitus without mention of complication, not stated as uncontrolled 05/21/2021  . Acute on chronic diastolic CHF (congestive heart failure) ([place]) 05/21/2021  . Long term current use of anticoagulant 05/20/2021  . Mitral regurgitation 05/20/2021  . Mediastinal lymphadenopathy 05/20/2021  . Atherosclerotic heart disease of native coronary artery with other forms of angina pectoris ([place])   . Acalculous cholecystitis 03/29/2021  . Right upper quadrant abdominal pain 03/28/2021  . AKI (acute kidney injury) ([place]) 11/24/2020  . Acute kidney injury ([place]) 11/23/2020  . OSA on CPAP 11/23/2020  . Chest pain 10/27/2020  . Snoring 08/15/2020  . Type II diabetes mellitus with renal manifestations ([place]) 06/11/2020  . COPD (chronic obstructive pulmonary disease) ([place]) 06/11/2020  . Leukocytosis 06/11/2020  . Hyperglycemia   . Elevated brain natriuretic peptide (BNP) level   . Uncontrolled type 2 diabetes mellitus with hyperglycemia, with long-term current use of insulin ([place]) 05/13/2020  . Acute on chronic HFrEF (heart failure with reduced ejection fraction) ([place]) 05/12/2020  . Erectile dysfunction due to arterial insufficiency 03/15/2020  . Screening PSA (prostate specific antigen) 03/15/2020  . Erectile dysfunction 03/02/2020  . History of pulmonary embolism   . Hemoptysis   . Costochondritis   . Acute on chronic combined systolic (congestive) and diastolic (congestive) heart failure ([place]) 12/20/2019  . Acute on chronic heart failure ([place]) 11/22/2019  . CKD (chronic kidney disease), stage IIIa 06/26/2019  . Type 2 diabetes mellitus with hyperlipidemia ([place]) 06/26/2019  . Hypertensive urgency 06/26/2019  . Acute on chronic combined systolic and diastolic CHF (congestive heart failure) ([place])  09/02/2017  . Other chest pain 08/24/2017  . Dizziness   . Noncompliance with medications 04/29/2017  . Back pain 03/06/2017  . Tobacco use 12/04/2016  . Gallbladder sludge 12/04/2016  . Acute on chronic systolic CHF (congestive heart failure) ([place]) 11/28/2016  . Coronary artery disease, non-occlusive 03/15/2016  . Atypical chest pain 02/16/2016  . Orthostatic hypotension 01/23/2016  . Hypomagnesemia 01/23/2016  . Chronic systolic CHF (congestive heart failure) ([place]) 01/23/2016  . NICM (nonischemic cardiomyopathy) ([place]) 01/17/2016  . NSVT (nonsustained ventricular tachycardia) ([place])   . Troponin I above reference range   . Mixed hyperlipidemia 05/17/2015  . COPD exacerbation ([place]) 05/17/2015  . Essential hypertension 03/31/2015  . Elevated troponin 03/20/2015     Past Surgical History   Past Surgical History:  Procedure Laterality Date  . AMPUTATION    . CARDIAC CATHETERIZATION    . FINGER AMPUTATION     Traumatic  . FINGER FRACTURE SURGERY Left   . LEFT HEART CATH AND CORONARY ANGIOGRAPHY N/A 01/06/2017   Procedure: Left Heart Cath and Coronary Angiography;  Surgeon: Wellington Hampshire, MD;  Location: [facility];  Service: Cardiovascular;  Laterality: N/A;  . RIGHT/LEFT HEART CATH AND CORONARY ANGIOGRAPHY N/A 06/13/2020   Procedure: RIGHT/LEFT HEART CATH  AND CORONARY ANGIOGRAPHY;  Surgeon: Nelva Bush, MD;  Location: [facility];  Service: Cardiovascular;  Laterality: N/A;     Home Medications   Prior to Admission medications   Medication Sig Start Date End Date Taking? Authorizing Provider  carvedilol (COREG) 3.125 MG tablet Take 1 tablet (3.125 mg total) by mouth 2 (two) times daily with a meal. 02/14/22  Yes Theora Gianotti, NP  dapagliflozin propanediol (FARXIGA) 10 MG TABS tablet Take 1 tablet (10 mg total) by mouth daily. 05/09/22  Yes Hackney, Tina A, FNP  fenofibrate (TRICOR) 145 MG tablet Take 1 tablet (145 mg total) by mouth daily.  01/15/22  Yes Hackney, Otila Kluver A, FNP  FLUoxetine (PROZAC) 20 MG capsule Take 1 capsule (20 mg total) by mouth daily. 02/08/22  Yes [name], FNP  Furosemide (FUROSCIX) 80 MG/10ML CTKT Inject 1 Dose into the skin.   Yes [provider]  insulin aspart (NOVOLOG) 100 UNIT/ML FlexPen Inject 3 units into the skin before breakfast and lunch and 5 units before dinner. 01/21/22  Yes Swayze, Ava, DO  Insulin Glargine (BASAGLAR KWIKPEN) 100 UNIT/ML Inject 36 Units into the skin at bedtime. 01/19/22 02/09/23 Yes Swayze, Ava, DO  potassium chloride SA (KLOR-CON M20) 20 MEQ tablet Take 1 tablet daily Patient taking differently: 20 mEq every other day. 01/15/22  Yes Alisa Graff, FNP  rivaroxaban (XARELTO) 20 MG TABS tablet Take 1 tablet (20 mg total) by mouth daily with supper. 01/15/22  Yes [name] A, FNP  rosuvastatin (CRESTOR) 40 MG tablet Take 1 tablet (40 mg total) by mouth daily. 04/01/22  Yes Gollan, Kathlene November, MD  sacubitril-valsartan (ENTRESTO) 24-26 MG Take 1 tablet by mouth 2 (two) times daily. 12/20/21  Yes [name] A, FNP  spironolactone (ALDACTONE) 25 MG tablet Take 1 tablet (25 mg total) by mouth daily. 04/25/22  Yes Hackney, Otila Kluver A, FNP  torsemide (DEMADEX) 20 MG tablet Take 2 tablets (40 mg total) by mouth 2 (two) times daily. 01/15/22  Yes Hackney, Tina A, FNP  Dulaglutide (TRULICITY) 3 UT/6.5YY SOPN Inject 3 mg as directed once a week. Patient not taking: Reported on 05/13/2022 02/08/22   Tally Joe T, FNP  Insulin Pen Needle (PEN NEEDLES) 32G X 4 MM MISC 200 each by Does not apply route at bedtime. 05/09/22   [name], FNP  Ipratropium-Albuterol (COMBIVENT RESPIMAT) 20-100 MCG/ACT AERS respimat Inhale 1 puff into the lungs every 6 (six) hours as needed for wheezing.    [provider]  nitroGLYCERIN (NITROSTAT) 0.4 MG SL tablet Place 1 tablet (0.4 mg total) under the tongue every 5 (five) minutes x 3 doses as needed for chest pain. 06/09/20   Lorella Nimrod, MD   sucralfate (CARAFATE) 1 g tablet TAKE 1 TABLET BY MOUTH 4 TIMES DAILY WITH MEALS AND AT BEDTIME Patient not taking: Reported on 05/09/2022 03/26/22     traZODone (DESYREL) 100 MG tablet Take 1 tablet (100 mg total) by mouth at bedtime. Patient not taking: Reported on 05/09/2022 02/08/22   Tally Joe T, FNP  insulin lispro (HUMALOG) 100 UNIT/ML KwikPen Inject 3 units before breakfast and lunch. 5 units before dinner. 01/19/22 01/21/22  Swayze, Ava, DO     Allergies  Metformin and related, Prednisone, and Zantac [ranitidine hcl]   Family History   Family History  Problem Relation Age of Onset  . Diabetes Mother   . Diabetes Mellitus II Mother   . Hypothyroidism Mother   . Hypertension Mother   .  Kidney failure Mother        Dialysis  . Heart attack Mother        81 yo approximately  . Hypertension Father   . Gout Father   . Cancer Maternal Grandfather   . Diabetes Maternal Grandfather   . Cancer Paternal Aunt      Physical Exam  Triage Vital Signs: ED Triage Vitals  Enc Vitals Group     BP 05/13/22 1627 130/79     Pulse Rate 05/13/22 1627 88     Resp 05/13/22 1627 18     Temp 05/13/22 1627 97.6 F (36.4 C)     Temp Source 05/13/22 1627 Oral     SpO2 05/13/22 1627 96 %     Weight 05/13/22 1638 237 lb (107.5 kg)     Height 05/13/22 1638 6' (1.829 m)     Head Circumference --      Peak Flow --      Pain Score 05/13/22 1638 0     Pain Loc --      Pain Edu? --      Excl. in [place]? --     Updated Vital Signs: BP (!) 163/87   Pulse 74   Temp 98.1 F (36.7 C) (Oral)   Resp 20   Ht 6' (1.829 m)   Wt 107.5 kg   SpO2 99%   BMI 32.14 kg/m    General: Awake, no distress.  Mildly dry mucous membranes. CV:  RRR.  Good peripheral perfusion.  Resp:  Normal effort.  CTAB. Abd:  Nontender.  No distention.  Other:  Bilateral calves are nontender nonswollen.  No carotid bruits.  Alert and oriented x3.  CN II to XII grossly intact.  5/5 motor strength and sensation all  extremities. MAEx4.   ED Results / Procedures / Treatments  Labs (all labs ordered are listed, but only abnormal results are displayed) Labs Reviewed  BASIC METABOLIC PANEL - Abnormal; Notable for the following components:      Result Value   Glucose, Bld 304 (*)    BUN 38 (*)    Creatinine, Ser 1.59 (*)    GFR, Estimated 52 (*)    All other components within normal limits  CBG MONITORING, ED - Abnormal; Notable for the following components:   Glucose-Capillary 281 (*)    All other components within normal limits  TROPONIN I (HIGH SENSITIVITY) - Abnormal; Notable for the following components:   Troponin I (High Sensitivity) 32 (*)    All other components within normal limits  CBC WITH DIFFERENTIAL/PLATELET  TROPONIN I (HIGH SENSITIVITY)     EKG  ED ECG REPORT I, Hamdi Vari J, the attending physician, personally viewed and interpreted this ECG.   Date: 05/14/2022  EKG Time: 1633  Rate: 90  Rhythm: normal sinus rhythm  Axis: Normal  Intervals:none  ST&T Change: Nonspecific    RADIOLOGY I have independently visualized and interpreted patient's CT head and chest x-ray as well as noted the radiology interpretation:  CT head: No ICH  Chest x-ray:  Official radiology report(s): CT Head Wo Contrast  Result Date: 05/13/2022 CLINICAL DATA:  Dizziness, persistent/recurrent, cardiac or vascular cause suspected EXAM: CT HEAD WITHOUT CONTRAST TECHNIQUE: Contiguous axial images were obtained from the base of the skull through the vertex without intravenous contrast. RADIATION DOSE REDUCTION: This exam was performed according to the departmental dose-optimization program which includes automated exposure control, adjustment of the mA and/or kV according to patient size and/or use of iterative  reconstruction technique. COMPARISON:  09/05/2019 FINDINGS: Brain: No acute intracranial abnormality. Specifically, no hemorrhage, hydrocephalus, mass lesion, acute infarction, or significant  intracranial injury. Vascular: No hyperdense vessel or unexpected calcification. Skull: No acute calvarial abnormality. Sinuses/Orbits: No acute findings Other: None IMPRESSION: No acute intracranial abnormality. Electronically Signed   By: Rolm Baptise M.D.   On: 05/13/2022 23:27     PROCEDURES:  Critical Care performed: {CriticalCareYesNo:19197::"Yes, see critical care procedure note(s)","No"}  .1-3 Lead EKG Interpretation  Performed by: Paulette Blanch, MD Authorized by: Paulette Blanch, MD     Interpretation: normal     ECG rate:  75   ECG rate assessment: normal     Rhythm: sinus rhythm     Ectopy: none     Conduction: normal   Comments:     Patient placed on cardiac monitor to evaluate for arrhythmias    MEDICATIONS ORDERED IN ED: Medications  sodium chloride 0.9 % bolus 500 mL (500 mLs Intravenous New Bag/Given 05/13/22 2349)     IMPRESSION / MDM / [place] / ED COURSE  I reviewed the triage vital signs and the nursing notes.                             51 year old male presenting with dizziness and syncope.  Differential diagnosis includes but is not limited to [place], CVA, ACS, metabolic, infectious etiologies, etc.  Patient's presentation is most consistent with {EM COPA:27473}  {If the patient is on the monitor, remove the brackets and asterisks on the sentence below and remember to document it as a Procedure as well. Otherwise delete the sentence below:1} {**The patient is on the cardiac monitor to evaluate for evidence of arrhythmia and/or significant heart rate changes.**}  {Remember to include, when applicable, any/all of the following data: independent review of imaging independent review of labs (comment specifically on pertinent positives and negatives) review of specific prior hospitalizations, PCP/specialist notes, etc. discuss meds given and prescribed document any discussion with consultants (including hospitalists) any clinical decision tools you  used and why (PECARN, NEXUS, etc.) did you consider admitting the patient? document social determinants of health affecting patient's care (homelessness, inability to follow up in a timely fashion, etc) document any pre-existing conditions increasing risk on current visit (e.g. diabetes and HTN increasing danger of high-risk chest pain/ACS) describes what meds you gave (especially parenteral) and why any other interventions?:1}      FINAL CLINICAL IMPRESSION(S) / ED DIAGNOSES   Final diagnoses:  Syncope and collapse  Dizziness and giddiness     Rx / DC Orders   ED Discharge Orders     None        Note:  This document was prepared using Dragon voice recognition software and may include unintentional dictation errors.

## 2022-05-13 NOTE — Telephone Encounter (Signed)
Medication Refill - Medication: Insulin Pen Needle (PEN NEEDLES) 32G X 4 MM MISC [[id number]]   Has the patient contacted their pharmacy? Yes.   (Agent: If no, request that the patient contact the pharmacy for the refill. If patient does not wish to contact the pharmacy document the reason why and proceed with request.) (Agent: If yes, when and what did the pharmacy advise?)  Preferred Pharmacy (with phone number or street name):  [facility] [address]  [place] [address]  Phone: [phone] Fax: [phone]  Hours: Not open 24 hours   Has the patient been seen for an appointment in the last year OR does the patient have an upcoming appointment? Yes.    Agent: Please be advised that RX refills may take up to 3 business days. We ask that you follow-up with your pharmacy.

## 2022-05-13 NOTE — Telephone Encounter (Signed)
Patient called and advised insulin pen needles sent to [facility] on 05/09/22. He verbalized understanding.

## 2022-05-13 NOTE — ED Triage Notes (Signed)
Pt arrives with c/o dizziness and LOC. Per pt, he get sdizzy all of a sudden and hit everything goes black and when he wakes up he is on the ground. Pt endorse nausea and diarrhea.

## 2022-05-13 NOTE — ED Provider Notes (Signed)
[facility] Provider Note    Event Date/Time   First MD Initiated Contact with Patient 05/13/22 2300     (approximate)   History   Loss of Consciousness   HPI  Gabriel [name]. is a 51 y.o. male who presents to the ED from home with a chief complaint of dizziness and syncope.  With a history of CAD, COPD, CKD, diabetes, CHF, hypertension, nonischemic cardiomyopathy who is scheduled for AICD placement next month who has been experiencing dizziness with frequent falls for the past week.  Saw his cardiologist who thought he was over diuresed, cut back on his diuretic and recommended hydration.  Patient states he got dizzy again today and this time he had syncopal episode.  Endorses chronic nausea and diarrhea times several months.  Denies changes, headache, chest pain, shortness of breath, abdominal pain or vomiting.     Past Medical History   Past Medical History:  Diagnosis Date   CAD (coronary artery disease)    a. 04/2015 low risk MV;  b. 12/2016 Cath: minor irregs in LAD/Diag/LCX/OM, RCA 40p/m/d; c. 05/2020 Cath: LM nl, LAD 50d, LCX 30p, OM1 40, RCA 100p w/ L->R collats. CO/CI 3.1/1.3-->Med rx.   Chest wall pain, chronic    Chronic Troponin Elevation    CKD (chronic kidney disease), stage II-III    COPD (chronic obstructive pulmonary disease) (HCC)    Diabetes mellitus without complication ([place])    HFrEF (heart failure with reduced ejection fraction) ([place])    a. 03/2015 Echo: EF 45-50%; b. 12/2015 Echo: EF 20-25%; c. 02/2016 Echo: EF 30-35%; d. 11/2016 Echo: EF 40-45%; e. 06/2019 Echo: EF 30-35%; f. 11/2019 Echo: EF 25-30%; g. 05/2020 Echo: EF 35-40%; h. 05/2021 Echo: EF 40-45%; i. 11/2021 Echo: EF 20-25%, glob HK, mild LVH, GrIII DD, sev red RV fxn, mild MR.   Hypertension    Mitral regurgitation    Mild to moderate by October 2021 echocardiogram.   Mixed Ischemic & NICM (nonischemic cardiomyopathy) ([place])    a. 03/2015 Echo: EF 45-50%; b. 12/2015 Echo: EF  20-25%; c. 02/2016 Echo: EF 30-35%; d. 11/2016 Echo: EF 40-45%; e. 06/2019 Echo: EF 30-35%; f. 11/2019 Echo: EF 25-30%; g. 05/2020 Echo: EF 35-40%; h. 05/2021 Echo: EF 40-45%; i. 11/2021 Echo: EF 20-25%   Myocardial infarct (HCC)    NSVT (nonsustained ventricular tachycardia) ([place])    a. 12/2015 noted on tele-->amio;  b. 12/2015 Event monitor: no VT noted.   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  12/31/19: RUL and RLL PEs.   Syncope    a. 01/2016 - felt to be vasovagal.     Active Problem List   Patient Active Problem List   Diagnosis Date Noted   Syncope and collapse 05/14/2022   Chronic obstructive pulmonary disease (COPD) ([place]) 05/14/2022   Need for influenza vaccination 05/09/2022   Abdominal pain 03/26/2022   Patient cannot afford medications 03/26/2022   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, with long-term current use of insulin ([place]) 02/08/2022   Depression, recurrent ([place]) 02/08/2022   Chronic tension-type headache, not intractable 02/08/2022   Acute kidney injury superimposed on chronic kidney disease ([employer]) 01/18/2022   Dyslipidemia 01/18/2022   Demand ischemia ([place])    Gastroenteritis 11/25/2021   SIRS (systemic inflammatory response syndrome) ([place]) 11/25/2021   Abnormal LFTs 11/24/2021   Pulmonary embolism ([place]) 11/24/2021  Acute on chronic systolic congestive heart failure ([place])    Annual physical exam 11/05/2021   Screening for malignant neoplasm of prostate 11/05/2021   Elevated serum creatinine 11/05/2021   Difficulty attaining erection 11/05/2021   Chronic HFrEF (heart failure with reduced ejection fraction) ([place]) 11/05/2021   Epistaxis 09/27/2021   CAD (coronary artery disease) 09/27/2021   Right foot pain 09/05/2021   Arch pain of right foot 09/05/2021   Elevated triglycerides with high cholesterol 09/05/2021   Memory loss or impairment  09/05/2021   Hyperlipidemia associated with type 2 diabetes mellitus ([place]) 09/05/2021   Hospital discharge follow-up 09/05/2021   Dyspnea 08/10/2021   Acute on chronic systolic (congestive) heart failure ([place]) 08/09/2021   Type II or unspecified type diabetes mellitus without mention of complication, not stated as uncontrolled 05/21/2021   Acute on chronic diastolic CHF (congestive heart failure) ([place]) 05/21/2021   Long term current use of anticoagulant 05/20/2021   Mitral regurgitation 05/20/2021   Mediastinal lymphadenopathy 05/20/2021   Atherosclerotic heart disease of native coronary artery with other forms of angina pectoris ([place])    Acalculous cholecystitis 03/29/2021   Right upper quadrant abdominal pain 03/28/2021   AKI (acute kidney injury) ([place]) 11/24/2020   Acute kidney injury ([place]) 11/23/2020   OSA on CPAP 11/23/2020   Chest pain 10/27/2020   Snoring 08/15/2020   Type II diabetes mellitus with renal manifestations ([place]) 06/11/2020   COPD (chronic obstructive pulmonary disease) ([place]) 06/11/2020   Leukocytosis 06/11/2020   Hyperglycemia    Elevated brain natriuretic peptide (BNP) level    Uncontrolled type 2 diabetes mellitus with hyperglycemia, with long-term current use of insulin ([place]) 05/13/2020   Acute on chronic HFrEF (heart failure with reduced ejection fraction) ([place]) 05/12/2020   Erectile dysfunction due to arterial insufficiency 03/15/2020   Screening PSA (prostate specific antigen) 03/15/2020   Erectile dysfunction 03/02/2020   History of pulmonary embolism    Hemoptysis    Costochondritis    Acute on chronic combined systolic (congestive) and diastolic (congestive) heart failure ([place]) 12/20/2019   Acute on chronic heart failure ([place]) 11/22/2019   CKD (chronic kidney disease), stage IIIa 06/26/2019   Type 2 diabetes mellitus with hyperlipidemia ([place]) 06/26/2019   Hypertensive urgency 06/26/2019   Acute on chronic combined systolic and diastolic CHF (congestive  heart failure) ([place]) 09/02/2017   Other chest pain 08/24/2017   Dizziness    Noncompliance with medications 04/29/2017   Back pain 03/06/2017   Tobacco use 12/04/2016   Gallbladder sludge 12/04/2016   Acute on chronic systolic CHF (congestive heart failure) ([place]) 11/28/2016   Coronary artery disease, non-occlusive 03/15/2016   Atypical chest pain 02/16/2016   Orthostatic hypotension 01/23/2016   Hypomagnesemia 35/32/9924   Chronic systolic CHF (congestive heart failure) ([place]) 01/23/2016   NICM (nonischemic cardiomyopathy) ([place]) 01/17/2016   NSVT (nonsustained ventricular tachycardia) (HCC)    Troponin I above reference range    Mixed hyperlipidemia 05/17/2015   COPD exacerbation ([place]) 05/17/2015   Essential hypertension 03/31/2015   Elevated troponin 03/20/2015     Past Surgical History   Past Surgical History:  Procedure Laterality Date   AMPUTATION     CARDIAC CATHETERIZATION     FINGER AMPUTATION     Traumatic   FINGER FRACTURE SURGERY Left    LEFT HEART CATH AND CORONARY ANGIOGRAPHY N/A 01/06/2017   Procedure: Left Heart Cath and Coronary Angiography;  Surgeon: Wellington Hampshire, MD;  Location: [facility];  Service: Cardiovascular;  Laterality: N/A;  RIGHT/LEFT HEART CATH AND CORONARY ANGIOGRAPHY N/A 06/13/2020   Procedure: RIGHT/LEFT HEART CATH AND CORONARY ANGIOGRAPHY;  Surgeon: Nelva Bush, MD;  Location: [facility];  Service: Cardiovascular;  Laterality: N/A;     Home Medications   Prior to Admission medications   Medication Sig Start Date End Date Taking? Authorizing Provider  carvedilol (COREG) 3.125 MG tablet Take 1 tablet (3.125 mg total) by mouth 2 (two) times daily with a meal. 02/14/22  Yes Theora Gianotti, NP  dapagliflozin propanediol (FARXIGA) 10 MG TABS tablet Take 1 tablet (10 mg total) by mouth daily. 05/09/22  Yes Hackney, Tina A, FNP  fenofibrate (TRICOR) 145 MG tablet Take 1 tablet (145 mg total) by mouth daily.  01/15/22  Yes Hackney, Otila Kluver A, FNP  FLUoxetine (PROZAC) 20 MG capsule Take 1 capsule (20 mg total) by mouth daily. 02/08/22  Yes [name], FNP  Furosemide (FUROSCIX) 80 MG/10ML CTKT Inject 1 Dose into the skin.   Yes [provider]  insulin aspart (NOVOLOG) 100 UNIT/ML FlexPen Inject 3 units into the skin before breakfast and lunch and 5 units before dinner. 01/21/22  Yes Swayze, Ava, DO  Insulin Glargine (BASAGLAR KWIKPEN) 100 UNIT/ML Inject 36 Units into the skin at bedtime. 01/19/22 02/09/23 Yes Swayze, Ava, DO  potassium chloride SA (KLOR-CON M20) 20 MEQ tablet Take 1 tablet daily Patient taking differently: 20 mEq every other day. 01/15/22  Yes Alisa Graff, FNP  rivaroxaban (XARELTO) 20 MG TABS tablet Take 1 tablet (20 mg total) by mouth daily with supper. 01/15/22  Yes [name] A, FNP  rosuvastatin (CRESTOR) 40 MG tablet Take 1 tablet (40 mg total) by mouth daily. 04/01/22  Yes Gollan, Kathlene November, MD  sacubitril-valsartan (ENTRESTO) 24-26 MG Take 1 tablet by mouth 2 (two) times daily. 12/20/21  Yes [name] A, FNP  spironolactone (ALDACTONE) 25 MG tablet Take 1 tablet (25 mg total) by mouth daily. 04/25/22  Yes Hackney, Otila Kluver A, FNP  torsemide (DEMADEX) 20 MG tablet Take 2 tablets (40 mg total) by mouth 2 (two) times daily. 01/15/22  Yes Hackney, Tina A, FNP  Dulaglutide (TRULICITY) 3 WE/9.9BZ SOPN Inject 3 mg as directed once a week. Patient not taking: Reported on 05/13/2022 02/08/22   Tally Joe T, FNP  Insulin Pen Needle (PEN NEEDLES) 32G X 4 MM MISC 200 each by Does not apply route at bedtime. 05/09/22   [name], FNP  Ipratropium-Albuterol (COMBIVENT RESPIMAT) 20-100 MCG/ACT AERS respimat Inhale 1 puff into the lungs every 6 (six) hours as needed for wheezing.    [provider]  nitroGLYCERIN (NITROSTAT) 0.4 MG SL tablet Place 1 tablet (0.4 mg total) under the tongue every 5 (five) minutes x 3 doses as needed for chest pain. 06/09/20   Lorella Nimrod, MD   sucralfate (CARAFATE) 1 g tablet TAKE 1 TABLET BY MOUTH 4 TIMES DAILY WITH MEALS AND AT BEDTIME Patient not taking: Reported on 05/09/2022 03/26/22     traZODone (DESYREL) 100 MG tablet Take 1 tablet (100 mg total) by mouth at bedtime. Patient not taking: Reported on 05/09/2022 02/08/22   Tally Joe T, FNP  insulin lispro (HUMALOG) 100 UNIT/ML KwikPen Inject 3 units before breakfast and lunch. 5 units before dinner. 01/19/22 01/21/22  Swayze, Ava, DO     Allergies  Metformin and related, Prednisone, and Zantac [ranitidine hcl]   Family History   Family History  Problem Relation Age of Onset   Diabetes Mother    Diabetes Mellitus  II Mother    Hypothyroidism Mother    Hypertension Mother    Kidney failure Mother        Dialysis   Heart attack Mother        62 yo approximately   Hypertension Father    Gout Father    Cancer Maternal Grandfather    Diabetes Maternal Grandfather    Cancer Paternal Aunt      Physical Exam  Triage Vital Signs: ED Triage Vitals  Enc Vitals Group     BP 05/13/22 1627 130/79     Pulse Rate 05/13/22 1627 88     Resp 05/13/22 1627 18     Temp 05/13/22 1627 97.6 F (36.4 C)     Temp Source 05/13/22 1627 Oral     SpO2 05/13/22 1627 96 %     Weight 05/13/22 1638 237 lb (107.5 kg)     Height 05/13/22 1638 6' (1.829 m)     Head Circumference --      Peak Flow --      Pain Score 05/13/22 1638 0     Pain Loc --      Pain Edu? --      Excl. in [place]? --     Updated Vital Signs: BP 138/79   Pulse 75   Temp 97.7 F (36.5 C) (Axillary)   Resp 20   Ht 6' (1.829 m)   Wt 107.5 kg   SpO2 98%   BMI 32.14 kg/m    General: Awake, no distress.  Mildly dry mucous membranes. CV:  RRR.  Good peripheral perfusion.  Resp:  Normal effort.  CTAB. Abd:  Nontender.  No distention.  Other:  Bilateral calves are nontender nonswollen.  No carotid bruits.  Alert and oriented x3.  CN II to XII grossly intact.  5/5 motor strength and sensation all extremities.  MAEx4.   ED Results / Procedures / Treatments  Labs (all labs ordered are listed, but only abnormal results are displayed) Labs Reviewed  BASIC METABOLIC PANEL - Abnormal; Notable for the following components:      Result Value   Glucose, Bld 304 (*)    BUN 38 (*)    Creatinine, Ser 1.59 (*)    GFR, Estimated 52 (*)    All other components within normal limits  CBG MONITORING, ED - Abnormal; Notable for the following components:   Glucose-Capillary 281 (*)    All other components within normal limits  CBG MONITORING, ED - Abnormal; Notable for the following components:   Glucose-Capillary 180 (*)    All other components within normal limits  CBG MONITORING, ED - Abnormal; Notable for the following components:   Glucose-Capillary 275 (*)    All other components within normal limits  TROPONIN I (HIGH SENSITIVITY) - Abnormal; Notable for the following components:   Troponin I (High Sensitivity) 32 (*)    All other components within normal limits  TROPONIN I (HIGH SENSITIVITY) - Abnormal; Notable for the following components:   Troponin I (High Sensitivity) 33 (*)    All other components within normal limits  CBC WITH DIFFERENTIAL/PLATELET  BASIC METABOLIC PANEL  CBC  HEMOGLOBIN A1C     EKG  ED ECG REPORT I, Nikodem Leadbetter J, the attending physician, personally viewed and interpreted this ECG.   Date: 05/14/2022  EKG Time: 1633  Rate: 90  Rhythm: normal sinus rhythm  Axis: Normal  Intervals:none  ST&T Change: Nonspecific    RADIOLOGY I have independently visualized and interpreted patient's  CT head and chest x-ray as well as noted the radiology interpretation:  CT head: No ICH  Chest x-ray: No acute cardiopulmonary process  Official radiology report(s): DG Chest 1 View  Result Date: 05/14/2022 CLINICAL DATA:  Syncope EXAM: CHEST  1 VIEW COMPARISON:  03/01/2022 FINDINGS: 10 mm nodular density at the right apex appears new from prior examination but is indeterminate.  This may represent an underlying pulmonary nodule or may be artifactual given portable, slightly lordotic technique. Lungs are otherwise clear. No pneumothorax or pleural effusion. Cardiac size within normal limits. Pulmonary vascularity is normal. No acute bone abnormality. IMPRESSION: 1. No radiographic evidence of acute cardiopulmonary disease. 2. Possible right apical pulmonary nodule. This could be better assessed with a standard two view chest radiograph or CT imaging of the chest. Electronically Signed   By: Fidela Salisbury M.D.   On: 05/14/2022 00:27   CT Head Wo Contrast  Result Date: 05/13/2022 CLINICAL DATA:  Dizziness, persistent/recurrent, cardiac or vascular cause suspected EXAM: CT HEAD WITHOUT CONTRAST TECHNIQUE: Contiguous axial images were obtained from the base of the skull through the vertex without intravenous contrast. RADIATION DOSE REDUCTION: This exam was performed according to the departmental dose-optimization program which includes automated exposure control, adjustment of the mA and/or kV according to patient size and/or use of iterative reconstruction technique. COMPARISON:  09/05/2019 FINDINGS: Brain: No acute intracranial abnormality. Specifically, no hemorrhage, hydrocephalus, mass lesion, acute infarction, or significant intracranial injury. Vascular: No hyperdense vessel or unexpected calcification. Skull: No acute calvarial abnormality. Sinuses/Orbits: No acute findings Other: None IMPRESSION: No acute intracranial abnormality. Electronically Signed   By: Rolm Baptise M.D.   On: 05/13/2022 23:27     PROCEDURES:  Critical Care performed: No  .1-3 Lead EKG Interpretation  Performed by: Paulette Blanch, MD Authorized by: Paulette Blanch, MD     Interpretation: normal     ECG rate:  75   ECG rate assessment: normal     Rhythm: sinus rhythm     Ectopy: none     Conduction: normal   Comments:     Patient placed on cardiac monitor to evaluate for  arrhythmias    MEDICATIONS ORDERED IN ED: Medications  carvedilol (COREG) tablet 3.125 mg (has no administration in time range)  fenofibrate tablet 160 mg (has no administration in time range)  nitroGLYCERIN (NITROSTAT) SL tablet 0.4 mg (has no administration in time range)  rosuvastatin (CRESTOR) tablet 40 mg (has no administration in time range)  FLUoxetine (PROZAC) capsule 20 mg (has no administration in time range)  dapagliflozin propanediol (FARXIGA) tablet 10 mg (has no administration in time range)  rivaroxaban (XARELTO) tablet 20 mg (20 mg Oral Not Given 05/14/22 0523)  potassium chloride SA (KLOR-CON M) CR tablet 20 mEq (20 mEq Oral Given 05/14/22 0546)  ipratropium-albuterol (DUONEB) 0.5-2.5 (3) MG/3ML nebulizer solution 3 mL (has no administration in time range)  0.9 %  sodium chloride infusion ( Intravenous New Bag/Given 05/14/22 0543)  acetaminophen (TYLENOL) tablet 650 mg (has no administration in time range)    Or  acetaminophen (TYLENOL) suppository 650 mg (has no administration in time range)  traZODone (DESYREL) tablet 25 mg (has no administration in time range)  magnesium hydroxide (MILK OF MAGNESIA) suspension 30 mL (has no administration in time range)  ondansetron (ZOFRAN) tablet 4 mg (has no administration in time range)    Or  ondansetron (ZOFRAN) injection 4 mg (has no administration in time range)  cefTRIAXone (ROCEPHIN) 1 g in sodium chloride  0.9 % 100 mL IVPB (1 g Intravenous New Bag/Given 05/14/22 0544)  insulin aspart (novoLOG) injection 0-20 Units (has no administration in time range)  insulin glargine-yfgn (SEMGLEE) injection 36 Units (36 Units Subcutaneous Given 05/14/22 0546)  sodium chloride 0.9 % bolus 500 mL (500 mLs Intravenous New Bag/Given 05/13/22 2349)     IMPRESSION / MDM / ASSESSMENT AND PLAN / ED COURSE  I reviewed the triage vital signs and the nursing notes.                             51 year old male presenting with dizziness and syncope.   Differential diagnosis includes but is not limited to [place], CVA, ACS, metabolic, infectious etiologies, etc.  I have personally reviewed patient's records and note his recent cardiology office visit on 05/09/2022 for heart failure maintenance.  Patient's presentation is most consistent with acute presentation with potential threat to life or bodily function.  The patient is on the cardiac monitor to evaluate for evidence of arrhythmia and/or significant heart rate changes.  Laboratory results demonstrate normal WBC 9.8, stable renal insufficiency BUN 38/creatinine 1.59, initial troponin mildly elevated at 32.  CT head negative for ICH.  Will perform orthostatic vital signs, initiate IV fluid hydration.  Given patient's extensive cardiac history and syncope, will consult hospital services for evaluation and admission.  Clinical Course as of 05/14/22 0623  Tue May 14, 2022  0006 Orthostatic VS:    Supine HR 79 BP 155/79 Sitting HR 86 BP 151/86 Standing HR 81 BP 163/87 Reported severe dizziness with standing. [JS]    Clinical Course User Index [JS] Paulette Blanch, MD     FINAL CLINICAL IMPRESSION(S) / ED DIAGNOSES   Final diagnoses:  Syncope and collapse  Dizziness and giddiness     Rx / DC Orders   ED Discharge Orders     None        Note:  This document was prepared using Dragon voice recognition software and may include unintentional dictation errors.   Paulette Blanch, MD 05/14/22 [phone]

## 2022-05-13 NOTE — ED Notes (Signed)
Orthostatic VS:   Supine HR 79 BP 155/79 Sitting HR 86 BP 151/86 Standing HR 81 BP 163/87 Reported severe dizziness with standing.

## 2022-05-13 NOTE — Telephone Encounter (Signed)
  Chief Complaint: Dizziness - passing out Symptoms: ibid Frequency: 3 times since Thursday Pertinent Negatives: Patient denies  Disposition: '[x]'$ ED /'[]'$ Urgent Care (no appt availability in office) / '[]'$ Appointment(In office/virtual)/ '[]'$  [facility]/ '[]'$ Home Care/ '[]'$ Refused Recommended Disposition /'[]'$ [facility]/ '[]'$  Follow-up with PCP Additional Notes: PT has a hx of heart problems. PT was seen at the office on Thursday. Since then the pt has gotten dizzy and passed out 3 times.     Reason for Disposition  SEVERE dizziness (e.g., unable to stand, requires support to walk, feels like passing out now)  Answer Assessment - Initial Assessment Questions 1. DESCRIPTION: "Describe your dizziness."     Dizziness - followed by black out 2. LIGHTHEADED: "Do you feel lightheaded?" (e.g., somewhat faint, woozy, weak upon standing)      3. VERTIGO: "Do you feel like either you or the room is spinning or tilting?" (i.e. vertigo)     no 4. SEVERITY: "How bad is it?"  "Do you feel like you are going to faint?" "Can you stand and walk?"   - MILD: Feels slightly dizzy, but walking normally.   - MODERATE: Feels unsteady when walking, but not falling; interferes with normal activities (e.g., school, work).   - SEVERE: Unable to walk without falling, or requires assistance to walk without falling; feels like passing out now.      severe 5. ONSET:  "When did the dizziness begin?"     Thursday 6. AGGRAVATING FACTORS: "Does anything make it worse?" (e.g., standing, change in head position)      7. HEART RATE: "Can you tell me your heart rate?" "How many beats in 15 seconds?"  (Note: not all patients can do this)        8. CAUSE: "What do you think is causing the dizziness?"     Heart issue or too much fluid removed. 9. RECURRENT SYMPTOM: "Have you had dizziness before?" If Yes, ask: "When was the last time?" "What happened that time?"      10. OTHER SYMPTOMS: "Do you have any other  symptoms?" (e.g., fever, chest pain, vomiting, diarrhea, bleeding)        11. PREGNANCY: "Is there any chance you are pregnant?" "When was your last menstrual period?"  Protocols used: Dizziness - Lightheadedness-A-AH

## 2022-05-14 ENCOUNTER — Emergency Department: Payer: Medicaid Other

## 2022-05-14 ENCOUNTER — Observation Stay: Payer: Medicaid Other

## 2022-05-14 DIAGNOSIS — R55 Syncope and collapse: Secondary | ICD-10-CM | POA: Diagnosis not present

## 2022-05-14 DIAGNOSIS — E782 Mixed hyperlipidemia: Secondary | ICD-10-CM | POA: Diagnosis not present

## 2022-05-14 DIAGNOSIS — N179 Acute kidney failure, unspecified: Secondary | ICD-10-CM

## 2022-05-14 DIAGNOSIS — Z794 Long term (current) use of insulin: Secondary | ICD-10-CM

## 2022-05-14 DIAGNOSIS — J449 Chronic obstructive pulmonary disease, unspecified: Secondary | ICD-10-CM

## 2022-05-14 DIAGNOSIS — E1165 Type 2 diabetes mellitus with hyperglycemia: Secondary | ICD-10-CM | POA: Diagnosis not present

## 2022-05-14 DIAGNOSIS — R918 Other nonspecific abnormal finding of lung field: Secondary | ICD-10-CM | POA: Diagnosis not present

## 2022-05-14 LAB — CBC
HCT: 41.4 % (ref 39.0–52.0)
Hemoglobin: 14.3 g/dL (ref 13.0–17.0)
MCH: 29.2 pg (ref 26.0–34.0)
MCHC: 34.5 g/dL (ref 30.0–36.0)
MCV: 84.5 fL (ref 80.0–100.0)
Platelets: 139 10*3/uL — ABNORMAL LOW (ref 150–400)
RBC: 4.9 MIL/uL (ref 4.22–5.81)
RDW: 13 % (ref 11.5–15.5)
WBC: 9.4 10*3/uL (ref 4.0–10.5)
nRBC: 0 % (ref 0.0–0.2)

## 2022-05-14 LAB — BASIC METABOLIC PANEL
Anion gap: 6 (ref 5–15)
BUN: 36 mg/dL — ABNORMAL HIGH (ref 6–20)
CO2: 26 mmol/L (ref 22–32)
Calcium: 9.1 mg/dL (ref 8.9–10.3)
Chloride: 104 mmol/L (ref 98–111)
Creatinine, Ser: 1.38 mg/dL — ABNORMAL HIGH (ref 0.61–1.24)
GFR, Estimated: >60 mL/min (ref >60)
Glucose, Bld: 302 mg/dL — ABNORMAL HIGH (ref 70–99)
Potassium: 3.5 mmol/L (ref 3.5–5.1)
Sodium: 136 mmol/L (ref 135–145)

## 2022-05-14 LAB — CBG MONITORING, ED
Glucose-Capillary: 180 mg/dL — ABNORMAL HIGH (ref 70–99)
Glucose-Capillary: 275 mg/dL — ABNORMAL HIGH (ref 70–99)
Glucose-Capillary: 243 mg/dL — ABNORMAL HIGH (ref 70–99)
Glucose-Capillary: 164 mg/dL — ABNORMAL HIGH (ref 70–99)
Glucose-Capillary: 177 mg/dL — ABNORMAL HIGH (ref 70–99)

## 2022-05-14 LAB — TROPONIN I (HIGH SENSITIVITY): Troponin I (High Sensitivity): 33 ng/L — ABNORMAL HIGH (ref <18)

## 2022-05-14 LAB — GLUCOSE, CAPILLARY: Glucose-Capillary: 134 mg/dL — ABNORMAL HIGH (ref 70–99)

## 2022-05-14 LAB — HEMOGLOBIN A1C
Hgb A1c MFr Bld: 7.5 % — ABNORMAL HIGH (ref 4.8–5.6)
Mean Plasma Glucose: 168.55 mg/dL

## 2022-05-14 MED ORDER — FLUOXETINE HCL 20 MG PO CAPS
20.0000 mg | ORAL_CAPSULE | Freq: Every day | ORAL | Status: DC
  Administered 2022-05-14 – 2022-05-15 (×2): 20 mg via ORAL
  Filled 2022-05-14 (×2): qty 1

## 2022-05-14 MED ORDER — INSULIN ASPART 100 UNIT/ML IJ SOLN
0.0000 [IU] | Freq: Three times a day (TID) | INTRAMUSCULAR | Status: DC
  Administered 2022-05-14: 4 [IU] via SUBCUTANEOUS
  Administered 2022-05-14: 7 [IU] via SUBCUTANEOUS
  Administered 2022-05-14: 4 [IU] via SUBCUTANEOUS
  Administered 2022-05-14: 3 [IU] via SUBCUTANEOUS
  Filled 2022-05-14 (×4): qty 1

## 2022-05-14 MED ORDER — HYDROCODONE-ACETAMINOPHEN 5-325 MG PO TABS
1.0000 | ORAL_TABLET | Freq: Four times a day (QID) | ORAL | Status: DC | PRN
  Administered 2022-05-14: 1 via ORAL
  Filled 2022-05-14: qty 1

## 2022-05-14 MED ORDER — IPRATROPIUM-ALBUTEROL 0.5-2.5 (3) MG/3ML IN SOLN: 3.0000 mL | Freq: Four times a day (QID) | RESPIRATORY_TRACT | Status: DC | PRN

## 2022-05-14 MED ORDER — ONDANSETRON HCL 4 MG PO TABS: 4.0000 mg | ORAL_TABLET | Freq: Four times a day (QID) | ORAL | Status: DC | PRN

## 2022-05-14 MED ORDER — INSULIN ASPART 100 UNIT/ML IJ SOLN: 3.0000 [IU] | Freq: Three times a day (TID) | INTRAMUSCULAR | Status: DC

## 2022-05-14 MED ORDER — SODIUM CHLORIDE 0.9 % IV SOLN
1.0000 g | INTRAVENOUS | Status: DC
  Administered 2022-05-14 – 2022-05-15 (×2): 1 g via INTRAVENOUS
  Filled 2022-05-14 (×2): qty 10

## 2022-05-14 MED ORDER — MECLIZINE HCL 25 MG PO TABS
25.0000 mg | ORAL_TABLET | Freq: Three times a day (TID) | ORAL | Status: AC
  Administered 2022-05-14 (×3): 25 mg via ORAL
  Filled 2022-05-14 (×3): qty 1

## 2022-05-14 MED ORDER — NITROGLYCERIN 0.4 MG SL SUBL
0.4000 mg | SUBLINGUAL_TABLET | SUBLINGUAL | Status: DC | PRN
Start: 2022-05-14 — End: 2022-05-15

## 2022-05-14 MED ORDER — INSULIN GLARGINE-YFGN 100 UNIT/ML ~~LOC~~ SOLN
36.0000 [IU] | Freq: Every day | SUBCUTANEOUS | Status: DC
  Filled 2022-05-14: qty 0.36

## 2022-05-14 MED ORDER — ACETAMINOPHEN 650 MG RE SUPP: 650.0000 mg | Freq: Four times a day (QID) | RECTAL | Status: DC | PRN

## 2022-05-14 MED ORDER — ROSUVASTATIN CALCIUM 10 MG PO TABS
40.0000 mg | ORAL_TABLET | Freq: Every day | ORAL | Status: DC
  Administered 2022-05-14 – 2022-05-15 (×2): 40 mg via ORAL
  Filled 2022-05-14: qty 2
  Filled 2022-05-14: qty 4

## 2022-05-14 MED ORDER — INSULIN GLARGINE-YFGN 100 UNIT/ML ~~LOC~~ SOLN
36.0000 [IU] | Freq: Every day | SUBCUTANEOUS | Status: DC
  Administered 2022-05-14: 36 [IU] via SUBCUTANEOUS
  Filled 2022-05-14 (×2): qty 0.36

## 2022-05-14 MED ORDER — MAGNESIUM HYDROXIDE 400 MG/5ML PO SUSP: 30.0000 mL | Freq: Every day | ORAL | Status: DC | PRN

## 2022-05-14 MED ORDER — POTASSIUM CHLORIDE CRYS ER 20 MEQ PO TBCR
20.0000 meq | EXTENDED_RELEASE_TABLET | Freq: Every day | ORAL | Status: DC
  Administered 2022-05-14 – 2022-05-15 (×2): 20 meq via ORAL
  Filled 2022-05-14 (×2): qty 1

## 2022-05-14 MED ORDER — CARVEDILOL 3.125 MG PO TABS
3.1250 mg | ORAL_TABLET | Freq: Two times a day (BID) | ORAL | Status: DC
  Administered 2022-05-14 – 2022-05-15 (×3): 3.125 mg via ORAL
  Filled 2022-05-14 (×3): qty 1

## 2022-05-14 MED ORDER — IOHEXOL 350 MG/ML SOLN
60.0000 mL | Freq: Once | INTRAVENOUS | Status: AC | PRN
  Administered 2022-05-14: 60 mL via INTRAVENOUS

## 2022-05-14 MED ORDER — ONDANSETRON HCL 4 MG/2ML IJ SOLN: 4.0000 mg | Freq: Four times a day (QID) | INTRAMUSCULAR | Status: DC | PRN

## 2022-05-14 MED ORDER — INSULIN GLARGINE-YFGN 100 UNIT/ML ~~LOC~~ SOLN: 36.0000 [IU] | Freq: Every day | SUBCUTANEOUS | Status: DC

## 2022-05-14 MED ORDER — FENOFIBRATE 160 MG PO TABS
160.0000 mg | ORAL_TABLET | Freq: Every day | ORAL | Status: DC
  Administered 2022-05-14 – 2022-05-15 (×2): 160 mg via ORAL
  Filled 2022-05-14 (×2): qty 1

## 2022-05-14 MED ORDER — DAPAGLIFLOZIN PROPANEDIOL 10 MG PO TABS
10.0000 mg | ORAL_TABLET | Freq: Every day | ORAL | Status: DC
  Administered 2022-05-14 – 2022-05-15 (×2): 10 mg via ORAL
  Filled 2022-05-14 (×2): qty 1

## 2022-05-14 MED ORDER — SODIUM CHLORIDE 0.9 % IV SOLN: INTRAVENOUS | Status: DC

## 2022-05-14 MED ORDER — ACETAMINOPHEN 325 MG PO TABS: 650.0000 mg | ORAL_TABLET | Freq: Four times a day (QID) | ORAL | Status: DC | PRN

## 2022-05-14 MED ORDER — RIVAROXABAN 20 MG PO TABS
20.0000 mg | ORAL_TABLET | Freq: Every day | ORAL | Status: DC
  Administered 2022-05-14: 20 mg via ORAL
  Filled 2022-05-14 (×2): qty 1

## 2022-05-14 MED ORDER — TRAZODONE HCL 50 MG PO TABS: 25.0000 mg | ORAL_TABLET | Freq: Every evening | ORAL | Status: DC | PRN

## 2022-05-14 NOTE — Evaluation (Signed)
Physical Therapy Evaluation Patient Details Name: Gabriel [name]. MRN: [id number] DOB: 03-16-1971 Today's Date: 05/14/2022  History of Present Illness  Pt is a 51 y.o. Caucasian male with medical history significant for stage II-3 chronic kidney disease, COPD, type 2 diabetes mellitus, heart failure with reduced ejection fraction, hypertension and mixed ischemic and nonischemic cardiomyopathy, who presented to the emergency room with acute onset of dizziness and syncope.  The patient had a scheduled AICD planned for next month.   Clinical Impression  Pt alert, oriented x4, denied pain currently but does reference chronic low back pain and L hip pain. Pt reported he is living with his father in a two story home, full flight of stairs to his bedroom. At least 3 falls in the last couple of days due to dizziness/blacking out.  The patient described his symptoms as rotational, with and without positional changes, denied recently hitting his head. Did state his vision will go black with some of the dizzy spells. He was able to perform bed mobility with supervision, sit <> stand with supervision and AD as well, and ambulated ~20f with PT. Also assessed for BPPV, POTENTIAL R posterior canal, epley x2 with pt reported improvement in symptoms with second attempt.  Overall the patient demonstrated deficits (see "PT Problem List") that impede the patient's functional abilities, safety, and mobility and would benefit from skilled PT intervention. Recommendation at this time is outpatient PT, specifically vestibular therapy for further assessment as well as compensatory strategies as needed.   Screening questions: denies: unilateral weakness/numbness, facial droop, slurred speech, or difficulty swallowing with episode, tinnitus, diplopia/visual field deficits Subjective history of current problem: sudden onset, comes and goes, not consistent with positional changes or activities.   exacerbating factors: pt  unclear but says in general standing makes it worse  Relieving factors: limited movement, sitting down, resting  Symptom duration: variable, 3-5 seconds to several minutes  Symptom frequency: variable  Description of symptoms, description of dizziness: rotational, vision blacks out   OCULOMOTOR / VESTIBULAR TESTING:  Oculomotor Exam- Room Light  Findings Comments  Ocular Alignment normal   Ocular ROM normal   Spontaneous Nystagmus normal   Gaze-Holding Nystagmus normal   End-Gaze Nystagmus normal   Vergence (normal 2-3") not examined   Smooth Pursuit normal   Saccades normal Did make pt feel woozy      BPPV TESTS:  Symptoms Duration Intensity Nystagmus  Left Dix-Hallpike negative     Right Dix-Hallpike dizziness 3-20seconds 5/10 Unclear, very minimal if present  Left Head Roll negative     Right Head Roll negative          Recommendations for follow up therapy are one component of a multi-disciplinary discharge planning process, led by the attending physician.  Recommendations may be updated based on patient status, additional functional criteria and insurance authorization.  Follow Up Recommendations Outpatient PT (vestibular therapy)      Assistance Recommended at Discharge Intermittent Supervision/Assistance  Patient can return home with the following  Assistance with cooking/housework;Assist for transportation;Help with stairs or ramp for entrance    Equipment Recommendations None recommended by PT  Recommendations for Other Services       Functional Status Assessment Patient has had a recent decline in their functional status and demonstrates the ability to make significant improvements in function in a reasonable and predictable amount of time.     Precautions / Restrictions Precautions Precautions: Fall Restrictions Weight Bearing Restrictions: No      Mobility  Bed  Mobility   Bed Mobility: Rolling, Sidelying to Sit, Sit to Supine, Supine to  Sit Rolling: Supervision Sidelying to sit: Min assist Supine to sit: Supervision Sit to supine: Supervision        Transfers Overall transfer level: Needs assistance Equipment used: None Transfers: Sit to/from Stand Sit to Stand: Supervision                Ambulation/Gait Ambulation/Gait assistance: Supervision Gait Distance (Feet): 80 Feet Assistive device: None         General Gait Details: very slow, cautious, no LOB  Stairs            Wheelchair Mobility    Modified Rankin (Stroke Patients Only)       Balance Overall balance assessment: Needs assistance Sitting-balance support: Feet supported Sitting balance-Leahy Scale: Good     Standing balance support: During functional activity Standing balance-Leahy Scale: Good                               Pertinent Vitals/Pain Pain Assessment Pain Assessment: No/denies pain    Home Living Family/patient expects to be discharged to:: Private residence Living Arrangements: Parent Available Help at Discharge: Family Type of Home: House Home Access: Stairs to enter   Technical brewer of Steps: 2 Alternate Level Stairs-Number of Steps: flight Home Layout: Two level Home Equipment: Conservation officer, nature (2 wheels);Rollator (4 wheels);Cane - single point Additional Comments: has equipment, does not use it    Prior Function Prior Level of Function : Independent/Modified Independent             Mobility Comments: not currently working       Journalist, newspaper        Extremity/Trunk Assessment   Upper Extremity Assessment Upper Extremity Assessment: Overall WFL for tasks assessed (symmetrical strength, coordination/sensation intact)    Lower Extremity Assessment Lower Extremity Assessment: Overall WFL for tasks assessed (symmetrical strength, coordination/sensation intact)    Cervical / Trunk Assessment Cervical / Trunk Assessment: Normal  Communication   Communication: No  difficulties  Cognition Arousal/Alertness: Awake/alert Behavior During Therapy: WFL for tasks assessed/performed Overall Cognitive Status: Within Functional Limits for tasks assessed                                          General Comments      Exercises Other Exercises Other Exercises: L and R dix hallpike, epley x2 on R   Assessment/Plan    PT Assessment Patient needs continued PT services  PT Problem List Decreased balance;Decreased mobility;Decreased activity tolerance       PT Treatment Interventions DME instruction;Therapeutic exercise;Gait training;Balance training;Stair training;Neuromuscular re-education;Functional mobility training;Therapeutic activities;Patient/family education    PT Goals (Current goals can be found in the Care Plan section)       Frequency Min 2X/week     Co-evaluation               AM-PAC PT "6 Clicks" Mobility  Outcome Measure Help needed turning from your back to your side while in a flat bed without using bedrails?: None Help needed moving from lying on your back to sitting on the side of a flat bed without using bedrails?: None Help needed moving to and from a bed to a chair (including a wheelchair)?: None Help needed standing up from a chair using your arms (e.g.,  wheelchair or bedside chair)?: None Help needed to walk in hospital room?: A Little Help needed climbing 3-5 steps with a railing? : A Little 6 Click Score: 22    End of Session   Activity Tolerance: Patient tolerated treatment well Patient left: in bed;with call bell/phone within reach Nurse Communication: Mobility status PT Visit Diagnosis: Other abnormalities of gait and mobility (R26.89);Dizziness and giddiness (R42)    Time: 1329-1401 PT Time Calculation (min) (ACUTE ONLY): 32 min   Charges:   PT Evaluation $PT Eval Low Complexity: 1 Low PT Treatments $Therapeutic Activity: 8-22 mins $Canalith Rep Proc: 8-22 mins        Lieutenant Diego PT, DPT 3:08 PM,05/14/22

## 2022-05-14 NOTE — Assessment & Plan Note (Signed)
-   The patient will be hydrated with IV normal saline and will follow BMP. - We will hold off nephrotoxins.

## 2022-05-14 NOTE — Assessment & Plan Note (Signed)
-   We will continue his inhalers. 

## 2022-05-14 NOTE — Progress Notes (Signed)
PT Cancellation Note  Patient Details Name: Gabriel [name]. MRN: [id number] DOB: 07/25/1971   Cancelled Treatment:    Reason Eval/Treat Not Completed: Other (comment). Pt headed to CT, PT to re-attempt as able.   Lieutenant Diego PT, DPT 10:40 AM,05/14/22

## 2022-05-14 NOTE — Progress Notes (Signed)
[facility] PLAN OF CARE NOTE Patient: Gabriel [name]. HXT:[id number]   PCP: [name], FNP DOB: 1970/11/27   DOA: 05/13/2022   DOS: 05/14/2022    Patient was admitted by my colleague earlier on 05/14/2022. I have reviewed the H&P as well as assessment and plan and agree with the same. Important changes in the plan are listed below.  Plan of care: Principal Problem:   Syncope and collapse Active Problems:   Acute kidney injury ([place])   Uncontrolled type 2 diabetes mellitus with hyperglycemia, with long-term current use of insulin ([facility])   Mixed hyperlipidemia   Essential hypertension   Chronic obstructive pulmonary disease (COPD) ([place])  Patient reports vertiginous symptoms specifically on changing his position which is leading to his recurrent syncopal events. At the time of my evaluation no focal deficit seen.  Finger-nose-finger normal.  Bilateral equal strength.  I was not able to appreciate any nystagmus. Will initiate scheduled meclizine and consult PT for vestibular evaluation.  Chest pain. Given that the patient is presenting with a syncopal event, reporting chest pain and has some cough and mild shortness of breath we will get CT chest PE protocol to rule out pulmonary embolism.  Pulmonary nodule. A 1 view chest x-ray made a comment about 10 mm right apex pulmonary nodule. I recommended a follow-up chest x-ray which was not showing the nodule although radiology recommended to perform a CT of the chest for definitive rule out. CT angio chest was ordered for this purpose. Patient had a CT angio chest in April 2023 which did not show any pulmonary nodules.  Back pain. Pain medication added for now  Author: Berle Mull, MD [facility] 05/14/2022 4:00 PM   If 7PM-7AM, please contact night-coverage at [email or web address]

## 2022-05-14 NOTE — Assessment & Plan Note (Signed)
-   The patient will be admitted to a cardiac telemetry observation bed. - We will continue hydration with IV normal saline. - We will check orthostatics. - Differential diagnosis would include cardiogenic, arrhythmia related, neurally mediated and less likely hypoglycemia.

## 2022-05-14 NOTE — ED Notes (Signed)
Message sent to Posey Pronto, MD for pain medication. Patient requests medication stronger than Tylenol for back pain.

## 2022-05-14 NOTE — Assessment & Plan Note (Signed)
-   We will continue the patient on sliding scale and continue basal coverage.

## 2022-05-14 NOTE — Assessment & Plan Note (Signed)
-   We will continue her antihypertensives. 

## 2022-05-14 NOTE — H&P (Addendum)
[facility]   PATIENT NAME: Gabriel [name]    MR#:  [id number]  DATE OF BIRTH:  1970/11/01  DATE OF ADMISSION:  05/13/2022  PRIMARY CARE PHYSICIAN: [name], FNP   Patient is coming from: Home  REQUESTING/REFERRING PHYSICIAN: Delman Kitten, MD  CHIEF COMPLAINT:   Chief Complaint  Patient presents with   Loss of Consciousness    HISTORY OF PRESENT ILLNESS:  Gabriel [name]. is a 51 y.o. Caucasian male with medical history significant for stage II-3 chronic kidney disease, COPD, type 2 diabetes mellitus, heart failure with reduced ejection fraction, hypertension and mixed ischemic and nonischemic cardiomyopathy, who presented to the emergency room with acute onset of dizziness and syncope.  The patient had a scheduled AICD planned for next month.  No reported fever or chills.  No paresthesias or focal muscle weakness.  No reported nausea or vomiting or abdominal pain.  He admits to mild diarrhea that is no more than his usual.  No chest pain or palpitations.  No cough or wheezing or dyspnea.  No dysuria, oliguria or hematuria or flank pain.  ED Course: Upon presentation to the emergency room, vital signs were within normal.  Labs revealed hyperglycemia of 304 and BUN of 38 with creatinine of 1.59 previously normal on 04/23/2022.  High sensitive troponin was 32 and CBC was within normal.  EKG as reviewed by me : EKG showed normal sinus rhythm with a rate of 90 with Q waves in V1. Imaging: Portable chest ray showed possible right apical pulmonary nodule that could be better assessed with CT.  Showed no acute cardiopulmonary disease.  The patient was given 500 mill IV normal saline bolus.  He will be admitted to a cardiac telemetry observation bed for further evaluation and management. PAST MEDICAL HISTORY:   Past Medical History:  Diagnosis Date   CAD (coronary artery disease)    a. 04/2015 low risk MV;  b. 12/2016 Cath: minor irregs in LAD/Diag/LCX/OM, RCA 40p/m/d; c. 05/2020  Cath: LM nl, LAD 50d, LCX 30p, OM1 40, RCA 100p w/ L->R collats. CO/CI 3.1/1.3-->Med rx.   Chest wall pain, chronic    Chronic Troponin Elevation    CKD (chronic kidney disease), stage II-III    COPD (chronic obstructive pulmonary disease) (HCC)    Diabetes mellitus without complication ([place])    HFrEF (heart failure with reduced ejection fraction) ([place])    a. 03/2015 Echo: EF 45-50%; b. 12/2015 Echo: EF 20-25%; c. 02/2016 Echo: EF 30-35%; d. 11/2016 Echo: EF 40-45%; e. 06/2019 Echo: EF 30-35%; f. 11/2019 Echo: EF 25-30%; g. 05/2020 Echo: EF 35-40%; h. 05/2021 Echo: EF 40-45%; i. 11/2021 Echo: EF 20-25%, glob HK, mild LVH, GrIII DD, sev red RV fxn, mild MR.   Hypertension    Mitral regurgitation    Mild to moderate by October 2021 echocardiogram.   Mixed Ischemic & NICM (nonischemic cardiomyopathy) ([place])    a. 03/2015 Echo: EF 45-50%; b. 12/2015 Echo: EF 20-25%; c. 02/2016 Echo: EF 30-35%; d. 11/2016 Echo: EF 40-45%; e. 06/2019 Echo: EF 30-35%; f. 11/2019 Echo: EF 25-30%; g. 05/2020 Echo: EF 35-40%; h. 05/2021 Echo: EF 40-45%; i. 11/2021 Echo: EF 20-25%   Myocardial infarct (HCC)    NSVT (nonsustained ventricular tachycardia) ([place])    a. 12/2015 noted on tele-->amio;  b. 12/2015 Event monitor: no VT noted.   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  12/31/19: RUL and RLL PEs.   Syncope    a. 01/2016 - felt to be vasovagal.    PAST SURGICAL HISTORY:   Past Surgical History:  Procedure Laterality Date   AMPUTATION     CARDIAC CATHETERIZATION     FINGER AMPUTATION     Traumatic   FINGER FRACTURE SURGERY Left    LEFT HEART CATH AND CORONARY ANGIOGRAPHY N/A 01/06/2017   Procedure: Left Heart Cath and Coronary Angiography;  Surgeon: Wellington Hampshire, MD;  Location: [facility];  Service: Cardiovascular;  Laterality: N/A;   RIGHT/LEFT HEART CATH AND CORONARY ANGIOGRAPHY N/A 06/13/2020   Procedure: RIGHT/LEFT HEART CATH  AND CORONARY ANGIOGRAPHY;  Surgeon: Nelva Bush, MD;  Location: [facility];  Service: Cardiovascular;  Laterality: N/A;    SOCIAL HISTORY:   Social History   Tobacco Use   Smoking status: Former    Packs/day: 0.50    Years: 33.00    Total pack years: 16.50    Types: Cigarettes    Quit date: 05/08/2020    Years since quitting: 2.0   Smokeless tobacco: Former    Quit date: 05/08/2020   Tobacco comments:    Quit Sept 2021  Substance Use Topics   Alcohol use: Not Currently    Comment: occassionally    FAMILY HISTORY:   Family History  Problem Relation Age of Onset   Diabetes Mother    Diabetes Mellitus II Mother    Hypothyroidism Mother    Hypertension Mother    Kidney failure Mother        Dialysis   Heart attack Mother        53 yo approximately   Hypertension Father    Gout Father    Cancer Maternal Grandfather    Diabetes Maternal Grandfather    Cancer Paternal Aunt     DRUG ALLERGIES:   Allergies  Allergen Reactions   Metformin And Related Nausea And Vomiting   Prednisone Other (See Comments)    Reaction: Hallucinations     Zantac [Ranitidine Hcl] Diarrhea and Nausea Only    Night sweats    REVIEW OF SYSTEMS:   ROS As per history of present illness. All pertinent systems were reviewed above. Constitutional, HEENT, cardiovascular, respiratory, GI, GU, musculoskeletal, neuro, psychiatric, endocrine, integumentary and hematologic systems were reviewed and are otherwise negative/unremarkable except for positive findings mentioned above in the HPI.   MEDICATIONS AT HOME:   Prior to Admission medications   Medication Sig Start Date End Date Taking? Authorizing Provider  carvedilol (COREG) 3.125 MG tablet Take 1 tablet (3.125 mg total) by mouth 2 (two) times daily with a meal. 02/14/22  Yes Theora Gianotti, NP  dapagliflozin propanediol (FARXIGA) 10 MG TABS tablet Take 1 tablet (10 mg total) by mouth daily. 05/09/22  Yes Hackney, Tina  A, FNP  fenofibrate (TRICOR) 145 MG tablet Take 1 tablet (145 mg total) by mouth daily. 01/15/22  Yes Hackney, Otila Kluver A, FNP  FLUoxetine (PROZAC) 20 MG capsule Take 1 capsule (20 mg total) by mouth daily. 02/08/22  Yes [name], FNP  Furosemide (FUROSCIX) 80 MG/10ML CTKT Inject 1 Dose into the skin.   Yes [provider]  insulin aspart (NOVOLOG) 100 UNIT/ML FlexPen Inject 3 units into the skin before breakfast and lunch and 5 units before dinner. 01/21/22  Yes Swayze, Ava, DO  Insulin Glargine (BASAGLAR KWIKPEN) 100 UNIT/ML Inject 36 Units into the skin at bedtime. 01/19/22 02/09/23 Yes Swayze, Ava, DO  potassium  chloride SA (KLOR-CON M20) 20 MEQ tablet Take 1 tablet daily Patient taking differently: 20 mEq every other day. 01/15/22  Yes Alisa Graff, FNP  rivaroxaban (XARELTO) 20 MG TABS tablet Take 1 tablet (20 mg total) by mouth daily with supper. 01/15/22  Yes [name] A, FNP  rosuvastatin (CRESTOR) 40 MG tablet Take 1 tablet (40 mg total) by mouth daily. 04/01/22  Yes Gollan, Kathlene November, MD  sacubitril-valsartan (ENTRESTO) 24-26 MG Take 1 tablet by mouth 2 (two) times daily. 12/20/21  Yes [name] A, FNP  spironolactone (ALDACTONE) 25 MG tablet Take 1 tablet (25 mg total) by mouth daily. 04/25/22  Yes Hackney, Otila Kluver A, FNP  torsemide (DEMADEX) 20 MG tablet Take 2 tablets (40 mg total) by mouth 2 (two) times daily. 01/15/22  Yes Hackney, Tina A, FNP  Dulaglutide (TRULICITY) 3 KY/7.0WC SOPN Inject 3 mg as directed once a week. Patient not taking: Reported on 05/13/2022 02/08/22   Tally Joe T, FNP  Insulin Pen Needle (PEN NEEDLES) 32G X 4 MM MISC 200 each by Does not apply route at bedtime. 05/09/22   [name], FNP  Ipratropium-Albuterol (COMBIVENT RESPIMAT) 20-100 MCG/ACT AERS respimat Inhale 1 puff into the lungs every 6 (six) hours as needed for wheezing.    [provider]  nitroGLYCERIN (NITROSTAT) 0.4 MG SL tablet Place 1 tablet (0.4 mg total) under the tongue every  5 (five) minutes x 3 doses as needed for chest pain. 06/09/20   Lorella Nimrod, MD  sucralfate (CARAFATE) 1 g tablet TAKE 1 TABLET BY MOUTH 4 TIMES DAILY WITH MEALS AND AT BEDTIME Patient not taking: Reported on 05/09/2022 03/26/22     traZODone (DESYREL) 100 MG tablet Take 1 tablet (100 mg total) by mouth at bedtime. Patient not taking: Reported on 05/09/2022 02/08/22   Tally Joe T, FNP  insulin lispro (HUMALOG) 100 UNIT/ML KwikPen Inject 3 units before breakfast and lunch. 5 units before dinner. 01/19/22 01/21/22  Swayze, Ava, DO      VITAL SIGNS:  Blood pressure (!) 163/87, pulse 74, temperature 98.1 F (36.7 C), temperature source Oral, resp. rate 20, height 6' (1.829 m), weight 107.5 kg, SpO2 99 %.  PHYSICAL EXAMINATION:  Physical Exam  GENERAL:  51 y.o.-year-old, Caucasian male patient lying in the bed with no acute distress.  EYES: Pupils equal, round, reactive to light and accommodation. No scleral icterus. Extraocular muscles intact.  HEENT: Head atraumatic, normocephalic. Oropharynx and nasopharynx clear.  NECK:  Supple, no jugular venous distention. No thyroid enlargement, no tenderness.  LUNGS: Normal breath sounds bilaterally, no wheezing, rales,rhonchi or crepitation. No use of accessory muscles of respiration.  CARDIOVASCULAR: Regular rate and rhythm, S1, S2 normal. No murmurs, rubs, or gallops.  ABDOMEN: Soft, nondistended, nontender. Bowel sounds present. No organomegaly or mass.  EXTREMITIES: No pedal edema, cyanosis, or clubbing.  NEUROLOGIC: Cranial nerves II through XII are intact. Muscle strength 5/5 in all extremities. Sensation intact. Gait not checked.  PSYCHIATRIC: The patient is alert and oriented x 3.  Normal affect and good eye contact. SKIN: No obvious rash, lesion, or ulcer.   LABORATORY PANEL:   CBC Recent Labs  Lab 05/13/22 1642  WBC 9.8  HGB 16.6  HCT 47.9  PLT 182    ------------------------------------------------------------------------------------------------------------------  Chemistries  Recent Labs  Lab 05/13/22 1642  NA 137  K 4.2  CL 99  CO2 25  GLUCOSE 304*  BUN 38*  CREATININE 1.59*  CALCIUM 10.1   ------------------------------------------------------------------------------------------------------------------  Cardiac Enzymes No  results for input(s): "TROPONINI" in the last 168 hours. ------------------------------------------------------------------------------------------------------------------  RADIOLOGY:  DG Chest 1 View  Result Date: 05/14/2022 CLINICAL DATA:  Syncope EXAM: CHEST  1 VIEW COMPARISON:  03/01/2022 FINDINGS: 10 mm nodular density at the right apex appears new from prior examination but is indeterminate. This may represent an underlying pulmonary nodule or may be artifactual given portable, slightly lordotic technique. Lungs are otherwise clear. No pneumothorax or pleural effusion. Cardiac size within normal limits. Pulmonary vascularity is normal. No acute bone abnormality. IMPRESSION: 1. No radiographic evidence of acute cardiopulmonary disease. 2. Possible right apical pulmonary nodule. This could be better assessed with a standard two view chest radiograph or CT imaging of the chest. Electronically Signed   By: Fidela Salisbury M.D.   On: 05/14/2022 00:27   CT Head Wo Contrast  Result Date: 05/13/2022 CLINICAL DATA:  Dizziness, persistent/recurrent, cardiac or vascular cause suspected EXAM: CT HEAD WITHOUT CONTRAST TECHNIQUE: Contiguous axial images were obtained from the base of the skull through the vertex without intravenous contrast. RADIATION DOSE REDUCTION: This exam was performed according to the departmental dose-optimization program which includes automated exposure control, adjustment of the mA and/or kV according to patient size and/or use of iterative reconstruction technique. COMPARISON:  09/05/2019  FINDINGS: Brain: No acute intracranial abnormality. Specifically, no hemorrhage, hydrocephalus, mass lesion, acute infarction, or significant intracranial injury. Vascular: No hyperdense vessel or unexpected calcification. Skull: No acute calvarial abnormality. Sinuses/Orbits: No acute findings Other: None IMPRESSION: No acute intracranial abnormality. Electronically Signed   By: Rolm Baptise M.D.   On: 05/13/2022 23:27      IMPRESSION AND PLAN:  Assessment and Plan: * Syncope and collapse - The patient will be admitted to a cardiac telemetry observation bed. - We will continue hydration with IV normal saline. - We will check orthostatics. - Differential diagnosis would include cardiogenic, arrhythmia related, neurally mediated and less likely hypoglycemia.  Acute kidney injury ([place]) - The patient will be hydrated with IV normal saline and will follow BMP. - We will hold off nephrotoxins.  Uncontrolled type 2 diabetes mellitus with hyperglycemia, with long-term current use of insulin (HCC) - We will continue the patient on sliding scale and continue basal coverage.  Mixed hyperlipidemia - We will continue statin therapy and fenofibrate.  Chronic obstructive pulmonary disease (COPD) (HCC) - We will continue his inhalers.  Essential hypertension - We will continue her antihypertensives.   DVT prophylaxis: Lovenox.  Advanced Care Planning:  Code Status: He is DNR/DNI.  This was discussed with him. Family Communication:  The plan of care was discussed in details with the patient (and family). I answered all questions. The patient agreed to proceed with the above mentioned plan. Further management will depend upon hospital course. Disposition Plan: Back to previous home environment Consults called: none.  All the records are reviewed and case discussed with ED provider.  Status is: Observation  I certify that at the time of admission, it is my clinical judgment that the patient will  require hospital care extending less than 2 midnights.                            Dispo: The patient is from: Home              Anticipated d/c is to: Home              Patient currently is not medically stable to d/c.  Difficult to place patient: No  Christel Mormon M.D on 05/14/2022 at 3:05 AM  [facility]   From 7 PM-7 AM, contact night-coverage [email or web address]  CC: Primary care physician; [name], FNP

## 2022-05-14 NOTE — Assessment & Plan Note (Addendum)
-   We will continue statin therapy and fenofibrate.

## 2022-05-15 DIAGNOSIS — R55 Syncope and collapse: Secondary | ICD-10-CM | POA: Diagnosis not present

## 2022-05-15 DIAGNOSIS — I10 Essential (primary) hypertension: Secondary | ICD-10-CM | POA: Diagnosis not present

## 2022-05-15 DIAGNOSIS — Z794 Long term (current) use of insulin: Secondary | ICD-10-CM | POA: Diagnosis not present

## 2022-05-15 DIAGNOSIS — N179 Acute kidney failure, unspecified: Secondary | ICD-10-CM | POA: Diagnosis not present

## 2022-05-15 DIAGNOSIS — J449 Chronic obstructive pulmonary disease, unspecified: Secondary | ICD-10-CM | POA: Diagnosis not present

## 2022-05-15 DIAGNOSIS — E1165 Type 2 diabetes mellitus with hyperglycemia: Secondary | ICD-10-CM | POA: Diagnosis not present

## 2022-05-15 DIAGNOSIS — E782 Mixed hyperlipidemia: Secondary | ICD-10-CM | POA: Diagnosis not present

## 2022-05-15 LAB — CBC
HCT: 39.9 % (ref 39.0–52.0)
Hemoglobin: 13.6 g/dL (ref 13.0–17.0)
MCH: 29 pg (ref 26.0–34.0)
MCHC: 34.1 g/dL (ref 30.0–36.0)
MCV: 85.1 fL (ref 80.0–100.0)
Platelets: 131 10*3/uL — ABNORMAL LOW (ref 150–400)
RBC: 4.69 MIL/uL (ref 4.22–5.81)
RDW: 12.9 % (ref 11.5–15.5)
WBC: 6.5 10*3/uL (ref 4.0–10.5)
nRBC: 0 % (ref 0.0–0.2)

## 2022-05-15 LAB — BASIC METABOLIC PANEL
Anion gap: 5 (ref 5–15)
BUN: 27 mg/dL — ABNORMAL HIGH (ref 6–20)
CO2: 26 mmol/L (ref 22–32)
Calcium: 9.1 mg/dL (ref 8.9–10.3)
Chloride: 111 mmol/L (ref 98–111)
Creatinine, Ser: 1.34 mg/dL — ABNORMAL HIGH (ref 0.61–1.24)
GFR, Estimated: >60 mL/min (ref >60)
Glucose, Bld: 106 mg/dL — ABNORMAL HIGH (ref 70–99)
Potassium: 4.1 mmol/L (ref 3.5–5.1)
Sodium: 142 mmol/L (ref 135–145)

## 2022-05-15 LAB — GLUCOSE, CAPILLARY: Glucose-Capillary: 103 mg/dL — ABNORMAL HIGH (ref 70–99)

## 2022-05-15 MED ORDER — MECLIZINE HCL 25 MG PO TABS: 25.0000 mg | ORAL_TABLET | Freq: Three times a day (TID) | ORAL | 0 refills | Status: DC | PRN

## 2022-05-15 NOTE — Plan of Care (Signed)
  Problem: Education: Goal: Knowledge of cardiac device and self-care will improve Outcome: Progressing Goal: Ability to safely manage health related needs after discharge will improve Outcome: Progressing Goal: Individualized Educational Video(s) Outcome: Progressing   Problem: Cardiac: Goal: Ability to achieve and maintain adequate cardiopulmonary perfusion will improve Outcome: Progressing   Problem: Education: Goal: Ability to describe self-care measures that may prevent or decrease complications (Diabetes Survival Skills Education) will improve Outcome: Progressing Goal: Individualized Educational Video(s) Outcome: Progressing   Problem: Coping: Goal: Ability to adjust to condition or change in health will improve Outcome: Progressing   Problem: Fluid Volume: Goal: Ability to maintain a balanced intake and output will improve Outcome: Progressing   Problem: Health Behavior/Discharge Planning: Goal: Ability to identify and utilize available resources and services will improve Outcome: Progressing Goal: Ability to manage health-related needs will improve Outcome: Progressing   Problem: Metabolic: Goal: Ability to maintain appropriate glucose levels will improve Outcome: Progressing   Problem: Nutritional: Goal: Maintenance of adequate nutrition will improve Outcome: Progressing Goal: Progress toward achieving an optimal weight will improve Outcome: Progressing   Problem: Skin Integrity: Goal: Risk for impaired skin integrity will decrease Outcome: Progressing   Problem: Tissue Perfusion: Goal: Adequacy of tissue perfusion will improve Outcome: Progressing   Problem: Education: Goal: Knowledge of General Education information will improve Description: Including pain rating scale, medication(s)/side effects and non-pharmacologic comfort measures Outcome: Progressing   Problem: Health Behavior/Discharge Planning: Goal: Ability to manage health-related needs  will improve Outcome: Progressing   Problem: Clinical Measurements: Goal: Ability to maintain clinical measurements within normal limits will improve Outcome: Progressing Goal: Will remain free from infection Outcome: Progressing Goal: Diagnostic test results will improve Outcome: Progressing Goal: Respiratory complications will improve Outcome: Progressing Goal: Cardiovascular complication will be avoided Outcome: Progressing   Problem: Activity: Goal: Risk for activity intolerance will decrease Outcome: Progressing   Problem: Nutrition: Goal: Adequate nutrition will be maintained Outcome: Progressing   Problem: Coping: Goal: Level of anxiety will decrease Outcome: Progressing   Problem: Elimination: Goal: Will not experience complications related to bowel motility Outcome: Progressing Goal: Will not experience complications related to urinary retention Outcome: Progressing   Problem: Pain Managment: Goal: General experience of comfort will improve Outcome: Progressing   Problem: Safety: Goal: Ability to remain free from injury will improve Outcome: Progressing   Problem: Skin Integrity: Goal: Risk for impaired skin integrity will decrease Outcome: Progressing

## 2022-05-15 NOTE — Progress Notes (Signed)
Physical Therapy Treatment Patient Details Name: Gabriel [name]. MRN: [id number] DOB: 17-Jul-1971 Today's Date: 05/15/2022   History of Present Illness Pt is a 51 y.o. Caucasian male with medical history significant for stage II-3 chronic kidney disease, COPD, type 2 diabetes mellitus, heart failure with reduced ejection fraction, hypertension and mixed ischemic and nonischemic cardiomyopathy, who presented to the emergency room with acute onset of dizziness and syncope.  The patient had a scheduled AICD planned for next month.    PT Comments    Patient alert, agreeable PT, stated feeling better than yesterday and that overall his dizziness symptoms have really improved, including when he turns his head to the R. He was able to transfer, perform bed mobility and ambulate ~2102f independently. R DMarye Roundand Epley Maneuver performed today, pt instructed in technique for home use and given handout to re-enforce education, no further questions at this time. The patient would benefit from further skilled PT intervention to continue to progress towards goals. Recommendation remains appropriate.     Recommendations for follow up therapy are one component of a multi-disciplinary discharge planning process, led by the attending physician.  Recommendations may be updated based on patient status, additional functional criteria and insurance authorization.  Follow Up Recommendations  Outpatient PT (vestibular therapy)     Assistance Recommended at Discharge Intermittent Supervision/Assistance  Patient can return home with the following Assistance with cooking/housework;Assist for transportation;Help with stairs or ramp for entrance   Equipment Recommendations  None recommended by PT    Recommendations for Other Services       Precautions / Restrictions Precautions Precautions: Fall Restrictions Weight Bearing Restrictions: No     Mobility  Bed Mobility Overal bed mobility:  Independent                  Transfers Overall transfer level: Independent                      Ambulation/Gait Ambulation/Gait assistance: Independent Gait Distance (Feet): 200 Feet Assistive device: None             Stairs             Wheelchair Mobility    Modified Rankin (Stroke Patients Only)       Balance Overall balance assessment: Independent                                          Cognition Arousal/Alertness: Awake/alert Behavior During Therapy: WFL for tasks assessed/performed Overall Cognitive Status: Within Functional Limits for tasks assessed                                          Exercises Other Exercises Other Exercises: R dix hallpike and epley, given handout    General Comments        Pertinent Vitals/Pain Pain Assessment Pain Assessment: No/denies pain    Home Living                          Prior Function            PT Goals (current goals can now be found in the care plan section) Progress towards PT goals: Progressing toward goals    Frequency  Min 2X/week      PT Plan Current plan remains appropriate    Co-evaluation              AM-PAC PT "6 Clicks" Mobility   Outcome Measure  Help needed turning from your back to your side while in a flat bed without using bedrails?: None Help needed moving from lying on your back to sitting on the side of a flat bed without using bedrails?: None Help needed moving to and from a bed to a chair (including a wheelchair)?: None Help needed standing up from a chair using your arms (e.g., wheelchair or bedside chair)?: None Help needed to walk in hospital room?: A Little Help needed climbing 3-5 steps with a railing? : A Little 6 Click Score: 22    End of Session   Activity Tolerance: Patient tolerated treatment well Patient left: Other (comment) (sitting EOB at end of session) Nurse Communication:  Mobility status PT Visit Diagnosis: Other abnormalities of gait and mobility (R26.89);Dizziness and giddiness (R42)     Time: 1113-1130 PT Time Calculation (min) (ACUTE ONLY): 17 min  Charges:  $Therapeutic Activity: 8-22 mins                     Lieutenant Diego PT, DPT 11:49 AM,05/15/22

## 2022-05-15 NOTE — TOC Transition Note (Signed)
Transition of Care [facility]) - CM/SW Discharge Note   Patient Details  Name: Gabriel [name]. MRN: [id number] Date of Birth: Feb 14, 1971  [facility] CM/SW Contact:  Candie Chroman, LCSW Phone Number: 05/15/2022, 11:06 AM   Clinical Narrative:  Patient has orders to discharge home today. CSW met with patient. No supports at bedside. CSW introduced role and explained that PT recommendations would be discussed. Patient is agreeable to vestibular therapy. Left voicemail at rehab dept, asking for call back so referral can be made. Patient has SDOH flag for food insecurities but he states he has no concerns. His father is on the way to pick him up. No further concerns. CSW signing off.   Final next level of care: OP Rehab Barriers to Discharge: No Barriers Identified   Patient Goals and CMS Choice        Discharge Placement                Patient to be transferred to facility by: Father   Patient and family notified of of transfer: 05/15/22  Discharge Plan and Services                                     Social Determinants of Health (SDOH) Interventions Food Insecurity Interventions: Inpatient TOC, Patient Refused (Patient reported no food insecurities)   Readmission Risk Interventions    08/13/2021    9:45 AM 05/22/2021    4:19 PM 11/27/2020    8:58 AM  Readmission Risk Prevention Plan  Transportation Screening Complete Complete   PCP or Specialist Appt within 3-5 Days Complete    Social Work Consult for [place] Planning/Counseling Complete Complete   Palliative Care Screening Not Applicable Not Applicable   Medication Review Press photographer) Complete Complete   PCP or Specialist appointment within 3-5 days of discharge   Complete

## 2022-05-15 NOTE — Discharge Summary (Signed)
Physician Discharge Summary   Patient: Gabriel [name]. MRN: [id number] DOB: 1971-01-03  Admit date:     05/13/2022  Discharge date: {dischdate:26783}  Discharge Physician: Patrecia Pour   PCP: [name], FNP   Recommendations at discharge:  {Tip this will not be part of the note when signed- Example include specific recommendations for outpatient follow-up, pending tests to follow-up on. (Optional):26781}  ***  Discharge Diagnoses: Principal Problem:   Syncope and collapse Active Problems:   Acute kidney injury ([place])   Uncontrolled type 2 diabetes mellitus with hyperglycemia, with long-term current use of insulin (HCC)   Mixed hyperlipidemia   Essential hypertension   Chronic obstructive pulmonary disease (COPD) ([place])  Resolved Problems:   * No resolved hospital problems. [facility] Course: No notes on file  Assessment and Plan: * Syncope and collapse - The patient will be admitted to a cardiac telemetry observation bed. - We will continue hydration with IV normal saline. - We will check orthostatics. - Differential diagnosis would include cardiogenic, arrhythmia related, neurally mediated and less likely hypoglycemia.  Acute kidney injury ([place]) - The patient will be hydrated with IV normal saline and will follow BMP. - We will hold off nephrotoxins.  Uncontrolled type 2 diabetes mellitus with hyperglycemia, with long-term current use of insulin (HCC) - We will continue the patient on sliding scale and continue basal coverage.  Mixed hyperlipidemia - We will continue statin therapy and fenofibrate.  Chronic obstructive pulmonary disease (COPD) (HCC) - We will continue his inhalers.  Essential hypertension - We will continue her antihypertensives.      {Tip this will not be part of the note when signed Body mass index is 32.14 kg/m. , ,  (Optional):26781}  {(NOTE) Pain control PDMP Statment (Optional):26782} Consultants: *** Procedures performed:  ***  Disposition: {Plan; Disposition:26390} Diet recommendation:  Discharge Diet Orders (From admission, onward)     Start     Ordered   05/15/22 0000  Diet - low sodium heart healthy        05/15/22 1109   05/15/22 0000  Diet Carb Modified        05/15/22 1109           {Diet_Plan:26776} DISCHARGE MEDICATION: Allergies as of 05/15/2022       Reactions   Metformin And Related Nausea And Vomiting   Prednisone Other (See Comments)   Reaction: Hallucinations    Zantac [ranitidine Hcl] Diarrhea, Nausea Only   Night sweats        Medication List     STOP taking these medications    sucralfate 1 g tablet Commonly known as: CARAFATE       TAKE these medications    Basaglar KwikPen 100 UNIT/ML Inject 36 Units into the skin at bedtime.   carvedilol 3.125 MG tablet Commonly known as: COREG Take 1 tablet (3.125 mg total) by mouth 2 (two) times daily with a meal.   Combivent Respimat 20-100 MCG/ACT Aers respimat Generic drug: Ipratropium-Albuterol Inhale 1 puff into the lungs every 6 (six) hours as needed for wheezing.   dapagliflozin propanediol 10 MG Tabs tablet Commonly known as: FARXIGA Take 1 tablet (10 mg total) by mouth daily.   fenofibrate 145 MG tablet Commonly known as: TRICOR Take 1 tablet (145 mg total) by mouth daily.   FLUoxetine 20 MG capsule Commonly known as: PROZAC Take 1 capsule (20 mg total) by mouth daily.   Furoscix 80 MG/10ML Ctkt Generic drug: Furosemide Inject 1 Dose  into the skin.   insulin aspart 100 UNIT/ML FlexPen Commonly known as: NOVOLOG Inject 3 units into the skin before breakfast and lunch and 5 units before dinner.   meclizine 25 MG tablet Commonly known as: ANTIVERT Take 1 tablet (25 mg total) by mouth 3 (three) times daily as needed for dizziness.   nitroGLYCERIN 0.4 MG SL tablet Commonly known as: NITROSTAT Place 1 tablet (0.4 mg total) under the tongue every 5 (five) minutes x 3 doses as needed for chest  pain.   Pen Needles 32G X 4 MM Misc 200 each by Does not apply route at bedtime.   potassium chloride SA 20 MEQ tablet Commonly known as: Klor-Con M20 Take 1 tablet by mouth daily (Take 1 tablet daily) What changed:  how much to take when to take this additional instructions   rivaroxaban 20 MG Tabs tablet Commonly known as: XARELTO Take 1 tablet (20 mg total) by mouth daily with supper.   rosuvastatin 40 MG tablet Commonly known as: CRESTOR Take 1 tablet (40 mg total) by mouth daily.   sacubitril-valsartan 24-26 MG Commonly known as: ENTRESTO Take 1 tablet by mouth 2 (two) times daily.   spironolactone 25 MG tablet Commonly known as: ALDACTONE Take 1 tablet (25 mg total) by mouth daily.   torsemide 20 MG tablet Commonly known as: DEMADEX Take 2 tablets by mouth two times daily and take an additional 4 tablets as needed for weight gain (Take 2 tablets (40 mg total) by mouth 2 (two) times daily.)   traZODone 100 MG tablet Commonly known as: DESYREL Take 1 tablet (100 mg total) by mouth at bedtime.   Trulicity 3 LO/7.5IE Sopn Generic drug: Dulaglutide Inject 3 mg as directed once a week.        Follow-up Information     Alisa Graff, FNP. Go on 05/27/2022.   Specialty: Family Medicine Contact information: [address] [place] [address] [phone]                Discharge Exam: Danley Danker Weights   05/13/22 1638  Weight: 107.5 kg   ***  Condition at discharge: {DC Condition:26389}  The results of significant diagnostics from this hospitalization (including imaging, microbiology, ancillary and laboratory) are listed below for reference.   Imaging Studies: CT Angio Chest Pulmonary Embolism (PE) W or WO Contrast  Result Date: 05/14/2022 CLINICAL DATA:  Chest pain and shortness of breath EXAM: CT ANGIOGRAPHY CHEST WITH CONTRAST TECHNIQUE: Multidetector CT imaging of the chest was performed using the standard protocol  during bolus administration of intravenous contrast. Multiplanar CT image reconstructions and MIPs were obtained to evaluate the vascular anatomy. RADIATION DOSE REDUCTION: This exam was performed according to the departmental dose-optimization program which includes automated exposure control, adjustment of the mA and/or kV according to patient size and/or use of iterative reconstruction technique. CONTRAST:  74m OMNIPAQUE IOHEXOL 350 MG/ML SOLN COMPARISON:  CT chest angiogram dated November 24, 2021; chest CT dated February 14, 2021 FINDINGS: Cardiovascular: Heart is upper limits of normal in size. Pericardial effusion. Moderate left main and three-vessel coronary artery calcifications. Normal caliber thoracic aorta with mild atherosclerotic disease. Adequate contrast opacification of the pulmonary arteries. Mediastinum/Nodes: Esophagus and thyroid are unremarkable. Mildly enlarged subcarinal lymph node measuring 1.4 cm in short axis on series 5, image 72, previously measured 1.9 cm, likely reactive. No pathologically enlarged lymph nodes seen in the chest. Lungs/Pleura: Central airways are patent. Mild bilateral bronchial wall thickening. No consolidation, pleural effusion  or pneumothorax. Solid left upper lobe pulmonary nodule measuring 5 mm on series 6, image 50, stable when compared with February 14, 2021 prior exam, no follow-up imaging is needed given greater than 1 year stability. Small bilateral calcified pulmonary nodules, likely sequela of prior granulomatous infection. Upper Abdomen: No acute abnormality. Musculoskeletal: No chest wall abnormality. No acute or significant osseous findings. Review of the MIP images confirms the above findings. IMPRESSION: 1. No evidence of pulmonary embolus or acute airspace opacity. 2. Mild bilateral bronchial wall thickening, findings can be seen in the setting of bronchitis. 3. Aortic Atherosclerosis (ICD10-I70.0). Electronically Signed   By: Yetta Glassman M.D.   On:  05/14/2022 11:08   DG Chest 2 View  Result Date: 05/14/2022 CLINICAL DATA:  Dizziness and loss of consciousness. Nausea. Mid nodule. EXAM: CHEST - 2 VIEW COMPARISON:  Chest radiograph from the same day. FINDINGS: The heart size and mediastinal contours are within normal limits. No consolidation. No visible pleural effusions or pneumothorax. Right apical nodular opacity seen on same day chest radiograph not as well visualized on this study; however, this could be related to differences in technique. The visualized skeletal structures are unremarkable. IMPRESSION: Right apical nodular opacity seen on same day chest radiograph not as well visualized on this study; however, this could be related to differences in technique. Chest CT is recommended to fully exclude a nodule. Electronically Signed   By: Margaretha Sheffield M.D.   On: 05/14/2022 09:24   DG Chest 1 View  Result Date: 05/14/2022 CLINICAL DATA:  Syncope EXAM: CHEST  1 VIEW COMPARISON:  03/01/2022 FINDINGS: 10 mm nodular density at the right apex appears new from prior examination but is indeterminate. This may represent an underlying pulmonary nodule or may be artifactual given portable, slightly lordotic technique. Lungs are otherwise clear. No pneumothorax or pleural effusion. Cardiac size within normal limits. Pulmonary vascularity is normal. No acute bone abnormality. IMPRESSION: 1. No radiographic evidence of acute cardiopulmonary disease. 2. Possible right apical pulmonary nodule. This could be better assessed with a standard two view chest radiograph or CT imaging of the chest. Electronically Signed   By: Fidela Salisbury M.D.   On: 05/14/2022 00:27   CT Head Wo Contrast  Result Date: 05/13/2022 CLINICAL DATA:  Dizziness, persistent/recurrent, cardiac or vascular cause suspected EXAM: CT HEAD WITHOUT CONTRAST TECHNIQUE: Contiguous axial images were obtained from the base of the skull through the vertex without intravenous contrast. RADIATION  DOSE REDUCTION: This exam was performed according to the departmental dose-optimization program which includes automated exposure control, adjustment of the mA and/or kV according to patient size and/or use of iterative reconstruction technique. COMPARISON:  09/05/2019 FINDINGS: Brain: No acute intracranial abnormality. Specifically, no hemorrhage, hydrocephalus, mass lesion, acute infarction, or significant intracranial injury. Vascular: No hyperdense vessel or unexpected calcification. Skull: No acute calvarial abnormality. Sinuses/Orbits: No acute findings Other: None IMPRESSION: No acute intracranial abnormality. Electronically Signed   By: Rolm Baptise M.D.   On: 05/13/2022 23:27    Microbiology: Results for orders placed or performed during the hospital encounter of 03/01/22  Resp Panel by RT-PCR (Flu A&B, Covid) Anterior Nasal Swab     Status: Abnormal   Collection Time: 03/01/22  4:38 PM   Specimen: Anterior Nasal Swab  Result Value Ref Range Status   SARS Coronavirus 2 by RT PCR POSITIVE (A) NEGATIVE Final    Comment: (NOTE) SARS-CoV-2 target nucleic acids are DETECTED.  The SARS-CoV-2 RNA is generally detectable in upper respiratory specimens during  the acute phase of infection. Positive results are indicative of the presence of the identified virus, but do not rule out bacterial infection or co-infection with other pathogens not detected by the test. Clinical correlation with patient history and other diagnostic information is necessary to determine patient infection status. The expected result is Negative.  Fact Sheet for Patients: EntrepreneurPulse.com.au  Fact Sheet for Healthcare Providers: IncredibleEmployment.be  This test is not yet approved or cleared by the [place] FDA and  has been authorized for detection and/or diagnosis of SARS-CoV-2 by FDA under an Emergency Use Authorization (EUA).  This EUA will remain in effect (meaning  this test can be used) for the duration of  the COVID-19 declaration under Section 564(b)(1) of the A ct, 21 U.S.C. section 360bbb-3(b)(1), unless the authorization is terminated or revoked sooner.     Influenza A by PCR NEGATIVE NEGATIVE Final   Influenza B by PCR NEGATIVE NEGATIVE Final    Comment: (NOTE) The Xpert Xpress SARS-CoV-2/FLU/RSV plus assay is intended as an aid in the diagnosis of influenza from Nasopharyngeal swab specimens and should not be used as a sole basis for treatment. Nasal washings and aspirates are unacceptable for Xpert Xpress SARS-CoV-2/FLU/RSV testing.  Fact Sheet for Patients: EntrepreneurPulse.com.au  Fact Sheet for Healthcare Providers: IncredibleEmployment.be  This test is not yet approved or cleared by the [place] FDA and has been authorized for detection and/or diagnosis of SARS-CoV-2 by FDA under an Emergency Use Authorization (EUA). This EUA will remain in effect (meaning this test can be used) for the duration of the COVID-19 declaration under Section 564(b)(1) of the Act, 21 U.S.C. section 360bbb-3(b)(1), unless the authorization is terminated or revoked.  Performed at [facility], [address] [place], [place] [address]     Labs: CBC: Recent Labs  Lab 05/13/22 1642 05/14/22 0615 05/15/22 0522  WBC 9.8 9.4 6.5  NEUTROABS 6.2  --   --   HGB 16.6 14.3 13.6  HCT 47.9 41.4 39.9  MCV 83.9 84.5 85.1  PLT 182 139* 031*   Basic Metabolic Panel: Recent Labs  Lab 05/13/22 1642 05/14/22 0615 05/15/22 0522  NA 137 136 142  K 4.2 3.5 4.1  CL 99 104 111  CO2 '25 26 26  '$ GLUCOSE 304* 302* 106*  BUN 38* 36* 27*  CREATININE 1.59* 1.38* 1.34*  CALCIUM 10.1 9.1 9.1   Liver Function Tests: No results for input(s): "AST", "ALT", "ALKPHOS", "BILITOT", "PROT", "ALBUMIN" in the last 168 hours. CBG: Recent Labs  Lab 05/14/22 0734 05/14/22 1138 05/14/22 1646 05/14/22 2022  05/15/22 0743  GLUCAP 243* 164* 177* 134* 103*    Discharge time spent: {LESS THAN/GREATER THAN:26388} 30 minutes.  Signed: Patrecia Pour, MD [facility] 05/15/2022

## 2022-05-17 IMAGING — DX DG CHEST 1V PORT
1 series · 1 of 1 positions shown · non-contrast
Comparison: October 27, 2020

CLINICAL DATA: Cough

EXAM:
PORTABLE CHEST 1 VIEW

[chest ap]
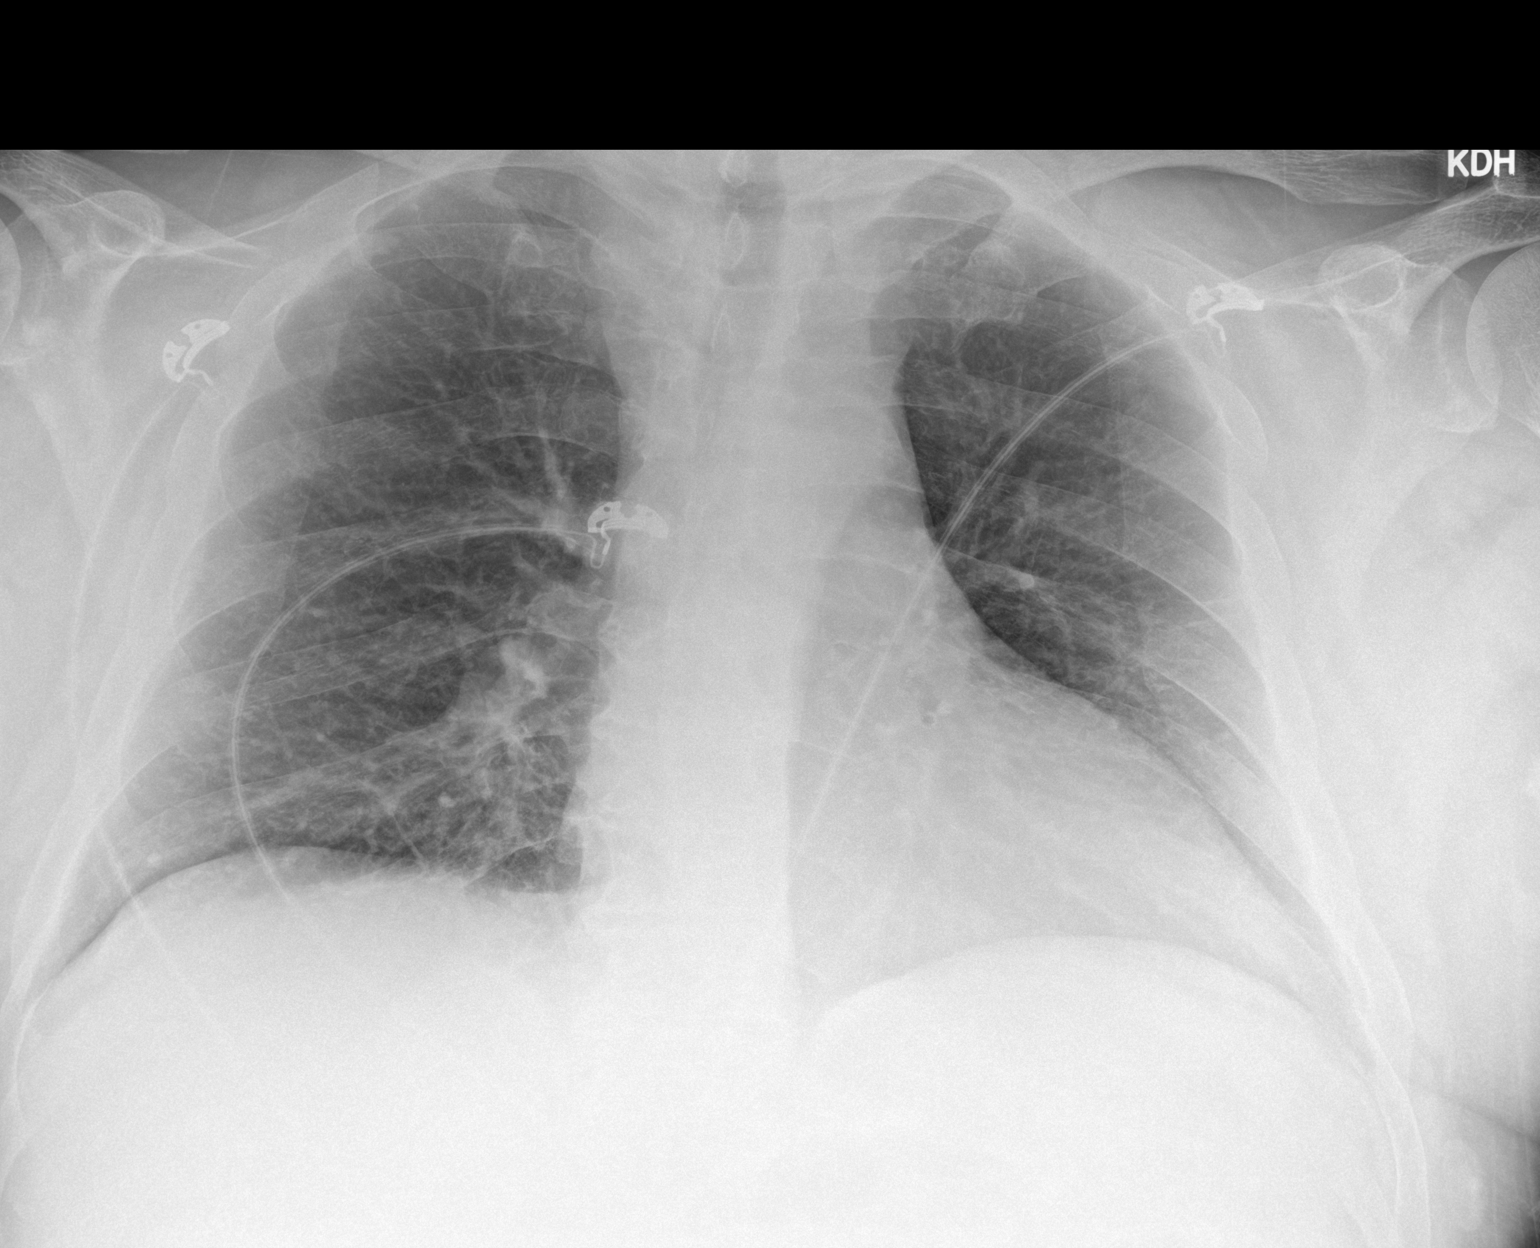

[1 of 1 positions shown; findings below may reference images not displayed]

FINDINGS: Lungs are clear. Heart is borderline enlarged with pulmonary
vascularity normal. No adenopathy. No bone lesions.
IMPRESSION: Lungs clear.  Heart borderline enlarged.

## 2022-05-17 NOTE — Pre-Procedure Instructions (Signed)
Instructed patient on the following items: Arrival time 0830 Nothing to eat or drink after midnight No meds AM of procedure Responsible person to drive you home and stay with you for 24 hrs Wash with special soap night before and morning of procedure If on anti-coagulant drug instructions Xarelto- last dose 9/28

## 2022-05-19 IMAGING — DX DG CHEST 1V PORT
1 series · 1 of 1 positions shown · non-contrast
Comparison: 11/23/2020

CLINICAL DATA: Dizziness and weakness.

EXAM:
PORTABLE CHEST 1 VIEW

[chest ap]
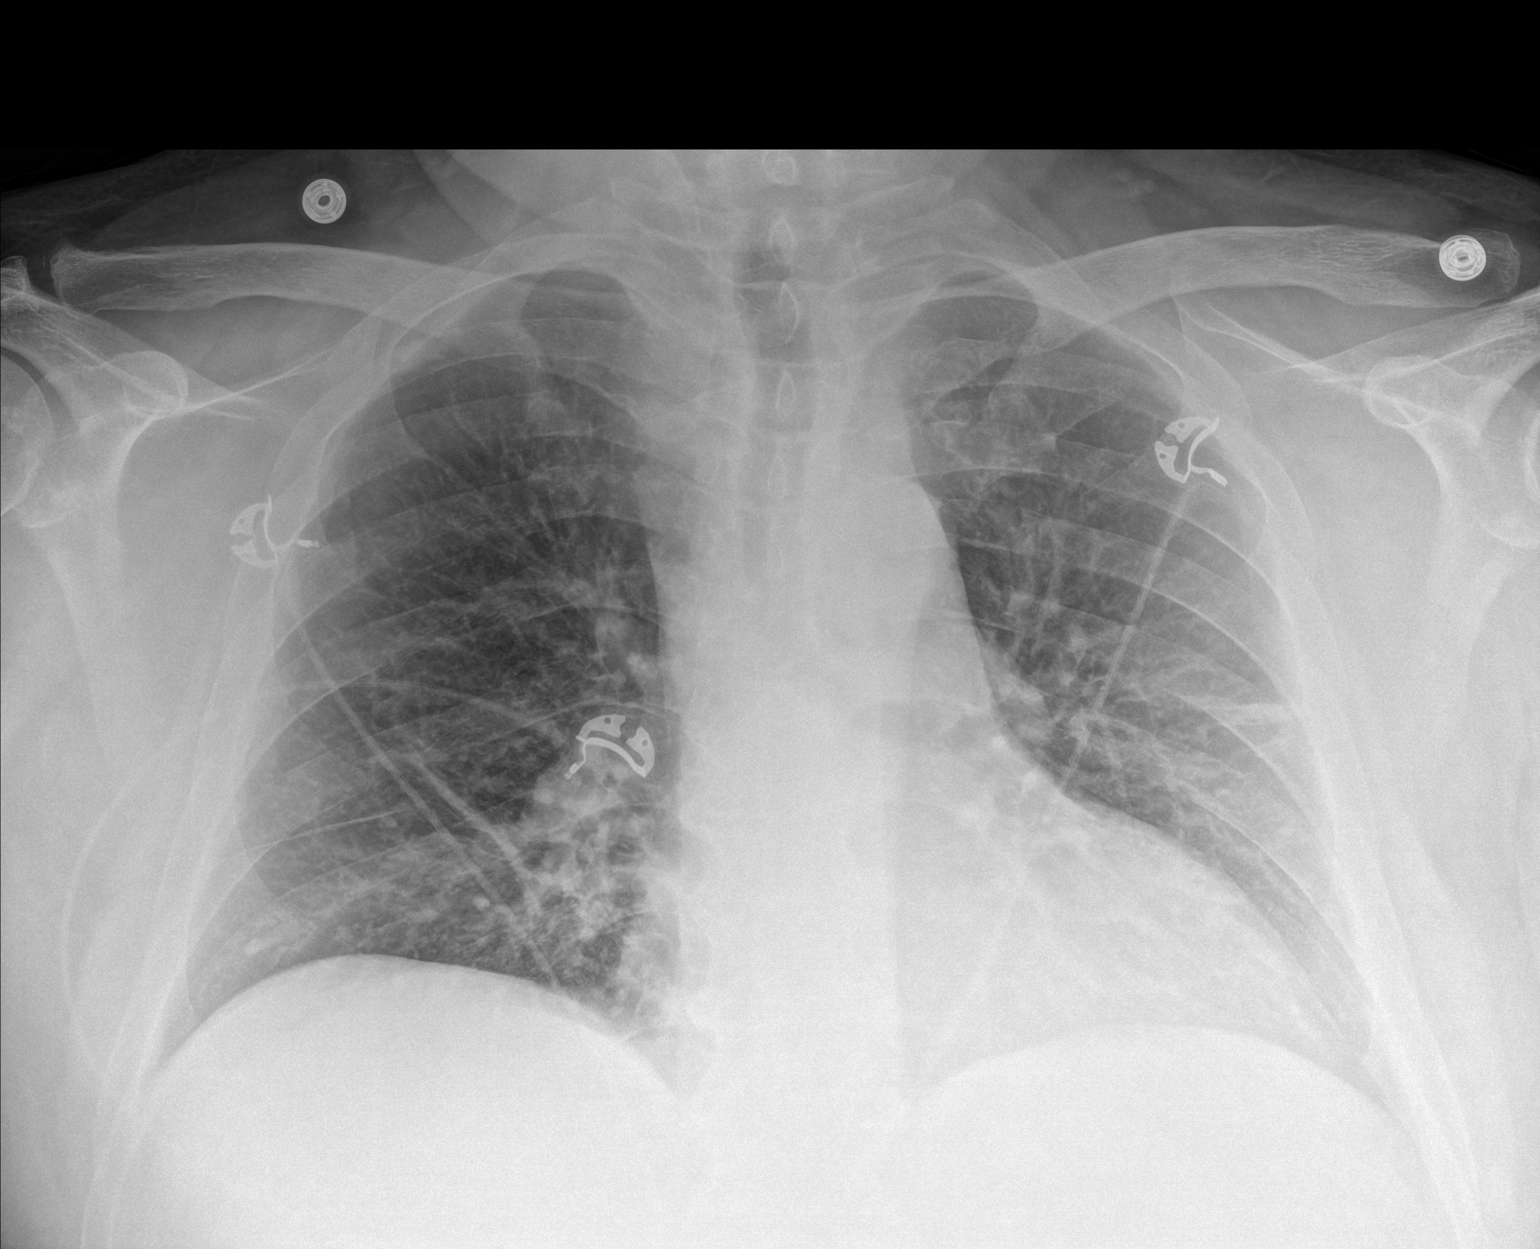

[1 of 1 positions shown; findings below may reference images not displayed]

FINDINGS: 3433 hours. Low volume film. Cardiopericardial silhouette is at
upper limits of normal for size. There is pulmonary vascular
congestion without overt pulmonary edema. Streaky opacity in the
left parahilar lung compatible with atelectasis. No overt edema,
focal consolidation, or pleural effusion. The visualized bony
structures of the thorax show no acute abnormality. Telemetry leads
overlie the chest.
IMPRESSION: Low volume film with pulmonary vascular congestion.

## 2022-05-19 NOTE — Anesthesia Preprocedure Evaluation (Signed)
Anesthesia Evaluation  Patient identified by MRN, date of birth, ID band Patient awake    Reviewed: Allergy & Precautions, NPO status , Patient's Chart, lab work & pertinent test results, reviewed documented beta blocker date and time   Airway Mallampati: III  TM Distance: >3 FB Neck ROM: Full    Dental  (+) Dental Advisory Given, Chipped,    Pulmonary shortness of breath, sleep apnea and Continuous Positive Airway Pressure Ventilation , COPD, Patient abstained from smoking., former smoker, PE   Pulmonary exam normal breath sounds clear to auscultation       Cardiovascular hypertension, Pt. on home beta blockers and Pt. on medications + angina + CAD, + Past MI and +CHF  Normal cardiovascular exam+ dysrhythmias Ventricular Tachycardia + Valvular Problems/Murmurs MR and AI  Rhythm:Regular Rate:Normal  TTE 2023 1. Left ventricular ejection fraction, by estimation, is 30 to 35%. The  left ventricle has moderate to severely decreased function. Left  ventricular diastolic parameters are consistent with Grade II diastolic  dysfunction (pseudonormalization).  2. Aortic valve regurgitation is mild to moderate.   Cath 2021 1. Severe single-vessel coronary artery disease with chronic total occlusion of the proximal RCA.  The distal vessel fills via left to right collaterals. 2. Mild to moderate, nonobstructive CAD involving the LAD and LCx with up to 50% stenosis. 3. Mildly elevated left and right heart filling pressures. 4. Moderate pulmonary hypertension (mean PAP 40 mmHg) with significantly elevated pulmonary vascular resistance (PVR 7.4 WU). 5. Severely reduced Fick cardiac output/index.     Neuro/Psych  Headaches, PSYCHIATRIC DISORDERS Depression    GI/Hepatic negative GI ROS, Neg liver ROS,   Endo/Other  diabetes, Type 2, Insulin Dependent  Renal/GU Renal InsufficiencyRenal diseaseLab Results      Component                 Value               Date                      CREATININE               1.34 (H)            05/15/2022                BUN                      27 (H)              05/15/2022                NA                       142                 05/15/2022                K                        4.1                 05/15/2022                CL                       111  05/15/2022                CO2                      26                  05/15/2022             negative genitourinary   Musculoskeletal negative musculoskeletal ROS (+)   Abdominal   Peds  Hematology  (+) Blood dyscrasia (on xarelto), ,   Anesthesia Other Findings   Reproductive/Obstetrics                            Anesthesia Physical Anesthesia Plan  ASA: 3  Anesthesia Plan: General   Post-op Pain Management:    Induction: Intravenous  PONV Risk Score and Plan: 2 and Midazolam, Dexamethasone and Ondansetron  Airway Management Planned: Oral ETT  Additional Equipment:   Intra-op Plan:   Post-operative Plan: Extubation in OR  Informed Consent: I have reviewed the patients History and Physical, chart, labs and discussed the procedure including the risks, benefits and alternatives for the proposed anesthesia with the patient or authorized representative who has indicated his/her understanding and acceptance.     Dental advisory given  Plan Discussed with: CRNA  Anesthesia Plan Comments:         Anesthesia Quick Evaluation

## 2022-05-20 ENCOUNTER — Encounter (HOSPITAL_COMMUNITY)
Admission: RE | Disposition: A | Discharge status: Home / Self Care | Payer: Self-pay | Source: Home / Self Care | Attending: Cardiology

## 2022-05-20 ENCOUNTER — Ambulatory Visit (HOSPITAL_BASED_OUTPATIENT_CLINIC_OR_DEPARTMENT_OTHER): Payer: Medicaid Other | Admitting: Anesthesiology

## 2022-05-20 ENCOUNTER — Ambulatory Visit (HOSPITAL_COMMUNITY)
Admission: RE | Admit: 2022-05-20 | Discharge: 2022-05-21 | Disposition: A | Discharge status: Home / Self Care | Payer: Medicaid Other | Attending: Cardiology | Admitting: Cardiology

## 2022-05-20 ENCOUNTER — Other Ambulatory Visit: Payer: Self-pay

## 2022-05-20 ENCOUNTER — Ambulatory Visit (HOSPITAL_COMMUNITY): Payer: Medicaid Other | Admitting: Anesthesiology

## 2022-05-20 ENCOUNTER — Ambulatory Visit (HOSPITAL_COMMUNITY): Payer: Medicaid Other

## 2022-05-20 ENCOUNTER — Ambulatory Visit: Payer: Medicaid Other | Admitting: Family

## 2022-05-20 DIAGNOSIS — I252 Old myocardial infarction: Secondary | ICD-10-CM | POA: Diagnosis not present

## 2022-05-20 DIAGNOSIS — Z95810 Presence of automatic (implantable) cardiac defibrillator: Secondary | ICD-10-CM

## 2022-05-20 DIAGNOSIS — I429 Cardiomyopathy, unspecified: Secondary | ICD-10-CM

## 2022-05-20 DIAGNOSIS — Z7901 Long term (current) use of anticoagulants: Secondary | ICD-10-CM | POA: Diagnosis not present

## 2022-05-20 DIAGNOSIS — Z794 Long term (current) use of insulin: Secondary | ICD-10-CM | POA: Insufficient documentation

## 2022-05-20 DIAGNOSIS — I472 Ventricular tachycardia, unspecified: Secondary | ICD-10-CM | POA: Insufficient documentation

## 2022-05-20 DIAGNOSIS — R918 Other nonspecific abnormal finding of lung field: Secondary | ICD-10-CM | POA: Diagnosis not present

## 2022-05-20 DIAGNOSIS — Z6832 Body mass index (BMI) 32.0-32.9, adult: Secondary | ICD-10-CM | POA: Insufficient documentation

## 2022-05-20 DIAGNOSIS — E1122 Type 2 diabetes mellitus with diabetic chronic kidney disease: Secondary | ICD-10-CM | POA: Insufficient documentation

## 2022-05-20 DIAGNOSIS — I428 Other cardiomyopathies: Secondary | ICD-10-CM | POA: Diagnosis not present

## 2022-05-20 DIAGNOSIS — Z86711 Personal history of pulmonary embolism: Secondary | ICD-10-CM | POA: Insufficient documentation

## 2022-05-20 DIAGNOSIS — I5022 Chronic systolic (congestive) heart failure: Secondary | ICD-10-CM | POA: Diagnosis present

## 2022-05-20 DIAGNOSIS — J449 Chronic obstructive pulmonary disease, unspecified: Secondary | ICD-10-CM | POA: Insufficient documentation

## 2022-05-20 DIAGNOSIS — G4730 Sleep apnea, unspecified: Secondary | ICD-10-CM | POA: Diagnosis not present

## 2022-05-20 DIAGNOSIS — N183 Chronic kidney disease, stage 3 unspecified: Secondary | ICD-10-CM | POA: Insufficient documentation

## 2022-05-20 DIAGNOSIS — I509 Heart failure, unspecified: Secondary | ICD-10-CM | POA: Diagnosis not present

## 2022-05-20 DIAGNOSIS — I110 Hypertensive heart disease with heart failure: Secondary | ICD-10-CM

## 2022-05-20 DIAGNOSIS — Z87891 Personal history of nicotine dependence: Secondary | ICD-10-CM | POA: Insufficient documentation

## 2022-05-20 DIAGNOSIS — I130 Hypertensive heart and chronic kidney disease with heart failure and stage 1 through stage 4 chronic kidney disease, or unspecified chronic kidney disease: Secondary | ICD-10-CM | POA: Insufficient documentation

## 2022-05-20 DIAGNOSIS — I25119 Atherosclerotic heart disease of native coronary artery with unspecified angina pectoris: Secondary | ICD-10-CM

## 2022-05-20 DIAGNOSIS — E669 Obesity, unspecified: Secondary | ICD-10-CM | POA: Insufficient documentation

## 2022-05-20 DIAGNOSIS — I2510 Atherosclerotic heart disease of native coronary artery without angina pectoris: Secondary | ICD-10-CM | POA: Insufficient documentation

## 2022-05-20 DIAGNOSIS — Z7985 Long-term (current) use of injectable non-insulin antidiabetic drugs: Secondary | ICD-10-CM | POA: Diagnosis not present

## 2022-05-20 HISTORY — DX: Presence of automatic (implantable) cardiac defibrillator: Z95.810

## 2022-05-20 HISTORY — PX: SUBQ ICD IMPLANT: EP1223

## 2022-05-20 LAB — GLUCOSE, CAPILLARY
Glucose-Capillary: 214 mg/dL — ABNORMAL HIGH (ref 70–99)
Glucose-Capillary: 191 mg/dL — ABNORMAL HIGH (ref 70–99)
Glucose-Capillary: 187 mg/dL — ABNORMAL HIGH (ref 70–99)
Glucose-Capillary: 317 mg/dL — ABNORMAL HIGH (ref 70–99)
Glucose-Capillary: 230 mg/dL — ABNORMAL HIGH (ref 70–99)

## 2022-05-20 SURGERY — SUBQ ICD IMPLANT
Anesthesia: General

## 2022-05-20 MED ORDER — MECLIZINE HCL 25 MG PO TABS: 25.0000 mg | ORAL_TABLET | Freq: Three times a day (TID) | ORAL | Status: DC | PRN

## 2022-05-20 MED ORDER — SPIRONOLACTONE 25 MG PO TABS
25.0000 mg | ORAL_TABLET | Freq: Every day | ORAL | Status: DC
  Administered 2022-05-20 – 2022-05-21 (×2): 25 mg via ORAL
  Filled 2022-05-20 (×2): qty 1

## 2022-05-20 MED ORDER — SODIUM CHLORIDE 0.9 % IV SOLN
80.0000 mg | INTRAVENOUS | Status: AC
  Administered 2022-05-20: 80 mg

## 2022-05-20 MED ORDER — CHLORHEXIDINE GLUCONATE 4 % EX LIQD: 4.0000 | Freq: Once | CUTANEOUS | Status: DC

## 2022-05-20 MED ORDER — MORPHINE SULFATE (PF) 2 MG/ML IV SOLN
0.5000 mg | Freq: Once | INTRAVENOUS | Status: AC
  Administered 2022-05-20: 0.5 mg via INTRAVENOUS
  Filled 2022-05-20: qty 1

## 2022-05-20 MED ORDER — DAPAGLIFLOZIN PROPANEDIOL 10 MG PO TABS
10.0000 mg | ORAL_TABLET | Freq: Every day | ORAL | Status: DC
  Administered 2022-05-21: 10 mg via ORAL
  Filled 2022-05-20: qty 1

## 2022-05-20 MED ORDER — LIDOCAINE 2% (20 MG/ML) 5 ML SYRINGE
INTRAMUSCULAR | Status: DC | PRN
  Administered 2022-05-20: 80 mg via INTRAVENOUS

## 2022-05-20 MED ORDER — SODIUM CHLORIDE 0.9 % IV SOLN
INTRAVENOUS | Status: AC
  Filled 2022-05-20: qty 2

## 2022-05-20 MED ORDER — OXYCODONE-ACETAMINOPHEN 5-325 MG PO TABS
ORAL_TABLET | ORAL | Status: AC
  Filled 2022-05-20: qty 1

## 2022-05-20 MED ORDER — MIDAZOLAM HCL 2 MG/2ML IJ SOLN
INTRAMUSCULAR | Status: DC | PRN
  Administered 2022-05-20: 2 mg via INTRAVENOUS

## 2022-05-20 MED ORDER — HYDROCODONE-ACETAMINOPHEN 5-325 MG PO TABS
2.0000 | ORAL_TABLET | Freq: Four times a day (QID) | ORAL | Status: DC | PRN
  Administered 2022-05-21: 2 via ORAL
  Filled 2022-05-20: qty 2

## 2022-05-20 MED ORDER — DEXAMETHASONE SODIUM PHOSPHATE 10 MG/ML IJ SOLN
INTRAMUSCULAR | Status: DC | PRN
  Administered 2022-05-20: 4 mg via INTRAVENOUS

## 2022-05-20 MED ORDER — TORSEMIDE 20 MG PO TABS
40.0000 mg | ORAL_TABLET | Freq: Two times a day (BID) | ORAL | Status: DC
  Administered 2022-05-21: 40 mg via ORAL
  Filled 2022-05-20: qty 2

## 2022-05-20 MED ORDER — INSULIN ASPART 100 UNIT/ML IJ SOLN
0.0000 [IU] | Freq: Three times a day (TID) | INTRAMUSCULAR | Status: DC
  Administered 2022-05-20: 11 [IU] via SUBCUTANEOUS
  Administered 2022-05-21: 3 [IU] via SUBCUTANEOUS

## 2022-05-20 MED ORDER — PHENYLEPHRINE HCL-NACL 20-0.9 MG/250ML-% IV SOLN
INTRAVENOUS | Status: DC | PRN
  Administered 2022-05-20: 25 ug/min via INTRAVENOUS

## 2022-05-20 MED ORDER — CEFAZOLIN SODIUM-DEXTROSE 2-4 GM/100ML-% IV SOLN
INTRAVENOUS | Status: AC
  Filled 2022-05-20: qty 100

## 2022-05-20 MED ORDER — ONDANSETRON HCL 4 MG/2ML IJ SOLN: 4.0000 mg | Freq: Four times a day (QID) | INTRAMUSCULAR | Status: DC | PRN

## 2022-05-20 MED ORDER — FENTANYL CITRATE (PF) 250 MCG/5ML IJ SOLN
INTRAMUSCULAR | Status: DC | PRN
  Administered 2022-05-20: 100 ug via INTRAVENOUS

## 2022-05-20 MED ORDER — SACUBITRIL-VALSARTAN 24-26 MG PO TABS
1.0000 | ORAL_TABLET | Freq: Two times a day (BID) | ORAL | Status: DC
  Administered 2022-05-20 – 2022-05-21 (×2): 1 via ORAL
  Filled 2022-05-20 (×2): qty 1

## 2022-05-20 MED ORDER — CARVEDILOL 3.125 MG PO TABS
3.1250 mg | ORAL_TABLET | Freq: Two times a day (BID) | ORAL | Status: DC
  Administered 2022-05-20 – 2022-05-21 (×2): 3.125 mg via ORAL
  Filled 2022-05-20 (×2): qty 1

## 2022-05-20 MED ORDER — SODIUM CHLORIDE 0.9 % IV SOLN: INTRAVENOUS | Status: DC

## 2022-05-20 MED ORDER — POTASSIUM CHLORIDE CRYS ER 20 MEQ PO TBCR
20.0000 meq | EXTENDED_RELEASE_TABLET | ORAL | Status: DC
  Administered 2022-05-21: 20 meq via ORAL
  Filled 2022-05-20: qty 1

## 2022-05-20 MED ORDER — FLUOXETINE HCL 20 MG PO CAPS
20.0000 mg | ORAL_CAPSULE | Freq: Every day | ORAL | Status: DC
  Administered 2022-05-20 – 2022-05-21 (×2): 20 mg via ORAL
  Filled 2022-05-20 (×2): qty 1

## 2022-05-20 MED ORDER — INSULIN ASPART 100 UNIT/ML IJ SOLN
INTRAMUSCULAR | Status: DC | PRN
  Administered 2022-05-20: 4 [IU] via SUBCUTANEOUS

## 2022-05-20 MED ORDER — BUPIVACAINE HCL (PF) 0.25 % IJ SOLN
INTRAMUSCULAR | Status: AC
  Filled 2022-05-20: qty 30

## 2022-05-20 MED ORDER — CEFAZOLIN SODIUM-DEXTROSE 2-4 GM/100ML-% IV SOLN
2.0000 g | INTRAVENOUS | Status: AC
  Administered 2022-05-20: 2 g via INTRAVENOUS

## 2022-05-20 MED ORDER — ONDANSETRON HCL 4 MG/2ML IJ SOLN
INTRAMUSCULAR | Status: DC | PRN
  Administered 2022-05-20: 4 mg via INTRAVENOUS

## 2022-05-20 MED ORDER — NITROGLYCERIN 0.4 MG SL SUBL: 0.4000 mg | SUBLINGUAL_TABLET | SUBLINGUAL | Status: DC | PRN

## 2022-05-20 MED ORDER — BUPIVACAINE HCL (PF) 0.25 % IJ SOLN
INTRAMUSCULAR | Status: DC | PRN
  Administered 2022-05-20: 30 mL

## 2022-05-20 MED ORDER — INSULIN ASPART 100 UNIT/ML IJ SOLN: 0.0000 [IU] | Freq: Three times a day (TID) | INTRAMUSCULAR | Status: DC

## 2022-05-20 MED ORDER — OXYCODONE-ACETAMINOPHEN 5-325 MG PO TABS
1.0000 | ORAL_TABLET | Freq: Once | ORAL | Status: AC
  Administered 2022-05-20: 1 via ORAL

## 2022-05-20 MED ORDER — PHENYLEPHRINE 80 MCG/ML (10ML) SYRINGE FOR IV PUSH (FOR BLOOD PRESSURE SUPPORT)
PREFILLED_SYRINGE | INTRAVENOUS | Status: DC | PRN
  Administered 2022-05-20: 160 ug via INTRAVENOUS

## 2022-05-20 MED ORDER — HYDROCODONE-ACETAMINOPHEN 5-325 MG PO TABS
1.0000 | ORAL_TABLET | Freq: Four times a day (QID) | ORAL | Status: DC | PRN
  Administered 2022-05-20 (×2): 1 via ORAL
  Filled 2022-05-20 (×2): qty 1

## 2022-05-20 MED ORDER — POVIDONE-IODINE 10 % EX SWAB
2.0000 | Freq: Once | CUTANEOUS | Status: AC
  Administered 2022-05-20: 2 via TOPICAL

## 2022-05-20 MED ORDER — ROSUVASTATIN CALCIUM 20 MG PO TABS
40.0000 mg | ORAL_TABLET | Freq: Every day | ORAL | Status: DC
  Administered 2022-05-21: 40 mg via ORAL
  Filled 2022-05-20: qty 2

## 2022-05-20 MED ORDER — TRAZODONE HCL 100 MG PO TABS
100.0000 mg | ORAL_TABLET | Freq: Every day | ORAL | Status: DC
  Filled 2022-05-20: qty 1

## 2022-05-20 MED ORDER — PROPOFOL 10 MG/ML IV BOLUS
INTRAVENOUS | Status: DC | PRN
  Administered 2022-05-20: 100 mg via INTRAVENOUS

## 2022-05-20 MED ORDER — ROCURONIUM BROMIDE 10 MG/ML (PF) SYRINGE
PREFILLED_SYRINGE | INTRAVENOUS | Status: DC | PRN
  Administered 2022-05-20: 100 mg via INTRAVENOUS

## 2022-05-20 MED ORDER — ACETAMINOPHEN 325 MG PO TABS: 325.0000 mg | ORAL_TABLET | ORAL | Status: DC | PRN

## 2022-05-20 MED ORDER — SUGAMMADEX SODIUM 200 MG/2ML IV SOLN
INTRAVENOUS | Status: DC | PRN
  Administered 2022-05-20: 200 mg via INTRAVENOUS

## 2022-05-20 SURGICAL SUPPLY — 6 items
CABLE SURGICAL S-101-97-12 (CABLE) ×2 IMPLANT
ICD SUBCU MRI EMBLEM A219 (ICD Generator) IMPLANT
LEAD SUBQU EMBLEM 3501 (Pacemaker) IMPLANT
MAT PREVALON FULL STRYKER (MISCELLANEOUS) IMPLANT
PAD DEFIB RADIO PHYSIO CONN (PAD) ×2 IMPLANT
TRAY PACEMAKER INSERTION (PACKS) ×2 IMPLANT

## 2022-05-20 NOTE — Discharge Instructions (Addendum)
Subcutaneous Cardioverter Defibrillator Implantation, Care After  This sheet gives you information about how to care for yourself after your procedure. Your health care provider may also give you more specific instructions. If you have problems or questions, contact your health care provider.  What can I expect after the procedure? After the procedure, it is common to have: Some pain. It may last a few days. A slight bump under the skin where the subcutaneous implantation cardioverter defibrillator (S-ICD) is. You may be able to feel the device under the skin. This is normal.  Follow these instructions at home: Medicines Take over-the-counter and prescription medicines only as told by your health care provider.   Incision care   Follow instructions from your health care provider about how to take care of your incision. Make sure you: Leave stitches (sutures), skin glue, or adhesive strips in place. These skin closures may need to stay in place for 2 weeks or longer. If adhesive strip edges start to loosen and curl up, you may trim the loose edges. Do not remove adhesive strips completely unless your health care provider tells you to do that. Check your incision area every day for signs of infection. Check for: Redness, swelling, or pain. Fluid or blood. Warmth. Pus or a bad smell.  Activity Do not lift anything that is heavier than 10 lb (4.5 kg), or the limit that you are told, until your health care provider says that it is safe. Avoid sports and any other activity that could cause a hit to the generator or leads. Ask your health care provider what activities are safe for you and when you may return to your normal activities. Follow instructions from your health care provider about exercise and sexual activity restrictions after your procedure.  Electric and magnetic fields Tell all health care providers, including your dentist, that you have a defibrillator. They need to know this so  they do not give you an MRI scan, which uses strong magnets. When using your cell phone, hold it to the ear that is on the opposite side from the defibrillator. Do not leave your cell phone in a pocket over the defibrillator. If you must pass through a metal detector, quickly walk through it. Do not stop under the detector, and do not stand near it. Avoid places or objects that have a strong electric or magnetic field, including: Airport security gates. At the airport, tell officials that you have a defibrillator. Your defibrillator ID card will let you be checked in a way that is safe for you and will not damage your defibrillator. Also, do not let a security person wave a magnetic wand near your defibrillator. That can make it stop working. Power plants. Large electrical generators. Anti-theft systems or electronic article surveillance (EAS). Radiofrequency transmission towers, such as cell phone and radio towers. Do not use amateur (ham) radio equipment or electric (arc) welding torches. Some devices are safe to use if they are held 12 inches (30 cm) or more away from your defibrillator. These include power tools, lawn mowers, and speakers. If you are not sure if something is safe to use, ask your health care provider. Do not use MP3 player headphones. They have magnets. You may safely use electric blankets, heating pads, computers, and microwave ovens. General instructions Do not take baths, swim, shower, or use a hot tub until your health care provider approves. You may need to take sponge baths until your health care provider says that you may bathe or   shower. Do not drive until your health care provider approves. Always keep your defibrillator ID card with you. The card should list the implant date, device model, and manufacturer. Consider wearing a medical alert bracelet or necklace that says that you have an S-ICD. Do not use any products that contain nicotine or tobacco, such as cigarettes  and e-cigarettes. If you need help quitting, ask your health care provider. Have your defibrillator checked as often as told by your health care provider. Most S-ICDs last for 4-8 years before they need to be replaced.  Contact a health care provider if: You feel one shock in your chest. You gain weight suddenly. You have a fever. You have severe pain, and medicines do not help. You have redness, swelling, or pain around your incision area. You have pus or a bad smell coming from your incision area. You have fluid or blood coming from your incision. Your incision area feels warm to the touch. Your heart feels like it is fluttering or skipping beats (heart palpitations). You feel increased anxiety or depression.  Get help right away if: You feel more than one shock. You have chest pain. You have problems breathing or have shortness of breath. You have dizziness or fainting.  Summary After the procedure, you may have some pain, see a bump under your skin, and feel the device under your skin. Check your incision area every day for signs of infection. Be careful around electric and magnetic fields. Always keep your defibrillator ID card with you. You should receive this in 6-8 weeks  

## 2022-05-20 NOTE — Anesthesia Procedure Notes (Addendum)
Procedure Name: Intubation Date/Time: 05/20/2022 10:20 AM  Performed by: Leonor Liv, CRNAPre-anesthesia Checklist: Patient identified, Emergency Drugs available, Suction available, Patient being monitored and Timeout performed Patient Re-evaluated:Patient Re-evaluated prior to induction Oxygen Delivery Method: Circle system utilized Preoxygenation: Pre-oxygenation with 100% oxygen Induction Type: IV induction Ventilation: Mask ventilation without difficulty, Oral airway inserted - appropriate to patient size and Two handed mask ventilation required Laryngoscope Size: Glidescope and 4 Grade View: Grade I Tube type: Subglottic suction tube Tube size: 7.5 mm Number of attempts: 1 Airway Equipment and Method: Stylet and Video-laryngoscopy Placement Confirmation: ETT inserted through vocal cords under direct vision, breath sounds checked- equal and bilateral and CO2 detector Secured at: 24 cm Tube secured with: Tape Dental Injury: Teeth and Oropharynx as per pre-operative assessment

## 2022-05-20 NOTE — H&P (Signed)
Electrophysiology Office Note:     Date:  05/20/2022    ID:  Gabriel Righter., DOB April 13, 1971, MRN [id number]   PCP:  [name], FNP  [facility] Cardiologist:  [name], MD  [facility] Electrophysiologist:  Vickie Epley, MD    Referring MD: Antony Madura, PA-C    Chief Complaint: Chronic systolic heart failure   History of Present Illness:     Gabriel Hemp. is a 51 y.o. male who presents for an evaluation of chronic systolic heart failure at the request of Gabriel Furth, PA-C. Their medical history includes coronary artery disease, chronic systolic heart failure, nonsustained ventricular tachycardia, CKD 3, COPD, pulmonary embolism on anticoagulation, diabetes and obesity.  The patient last saw Gabriel in clinic Jan 15, 2022.  At that appointment a repeat echo was ordered to recheck his left ventricular function.  His diuretics were also increased.  He saw Gabriel [name] in clinic on January 21, 2022.  At that appointment he reported class III symptoms.   Presents today for S-ICD implant.     Objective      Past Medical History:  Diagnosis Date   CAD (coronary artery disease)      a. 04/2015 low risk MV;  b. 12/2016 Cath: minor irregs in LAD/Diag/LCX/OM, RCA 40p/m/d; c. 05/2020 Cath: LM nl, LAD 50d, LCX 30p, OM1 40, RCA 100p w/ L->R collats. CO/CI 3.1/1.3-->Med rx.   Chest wall pain, chronic     Chronic Troponin Elevation     CKD (chronic kidney disease), stage II-III     COPD (chronic obstructive pulmonary disease) (HCC)     Diabetes mellitus without complication ([place])     HFrEF (heart failure with reduced ejection fraction) ([place])      a. 03/2015 Echo: EF 45-50%; b. 12/2015 Echo: EF 20-25%; c. 02/2016 Echo: EF 30-35%; d. 11/2016 Echo: EF 40-45%; e. 06/2019 Echo: EF 30-35%; f. 11/2019 Echo: EF 25-30%; g. 05/2020 Echo: EF 35-40%; h. 05/2021 Echo: EF 40-45%; i. 11/2021 Echo: EF 20-25%, glob HK, mild LVH, GrIII DD, sev red RV fxn, mild MR.   Hypertension      Mitral regurgitation      Mild to moderate by October 2021 echocardiogram.   Mixed Ischemic & NICM (nonischemic cardiomyopathy) ([place])      a. 03/2015 Echo: EF 45-50%; b. 12/2015 Echo: EF 20-25%; c. 02/2016 Echo: EF 30-35%; d. 11/2016 Echo: EF 40-45%; e. 06/2019 Echo: EF 30-35%; f. 11/2019 Echo: EF 25-30%; g. 05/2020 Echo: EF 35-40%; h. 05/2021 Echo: EF 40-45%; i. 11/2021 Echo: EF 20-25%   Myocardial infarct (HCC)     NSVT (nonsustained ventricular tachycardia) ([place])      a. 12/2015 noted on tele-->amio;  b. 12/2015 Event monitor: no VT noted.   Obesity (BMI 30.0-34.9)     Psoriasis     Recurrent pulmonary emboli ([place]) 06/07/2020    06/07/20: small bilateral PEs.  12/31/19: RUL and RLL PEs.   Syncope      a. 01/2016 - felt to be vasovagal.           Past Surgical History:  Procedure Laterality Date   AMPUTATION       CARDIAC CATHETERIZATION       FINGER AMPUTATION        Traumatic   FINGER FRACTURE SURGERY Left     LEFT HEART CATH AND CORONARY ANGIOGRAPHY N/A 01/06/2017    Procedure: Left Heart Cath and Coronary Angiography;  Surgeon: Wellington Hampshire, MD;  Location: [facility];  Service: Cardiovascular;  Laterality: N/A;   RIGHT/LEFT HEART CATH AND CORONARY ANGIOGRAPHY N/A 06/13/2020    Procedure: RIGHT/LEFT HEART CATH AND CORONARY ANGIOGRAPHY;  Surgeon: Nelva Bush, MD;  Location: [facility];  Service: Cardiovascular;  Laterality: N/A;      Current Medications: Active Medications      Current Meds  Medication Sig   carvedilol (COREG) 3.125 MG tablet Take 1 tablet (3.125 mg total) by mouth 2 (two) times daily with a meal.   dapagliflozin propanediol (FARXIGA) 10 MG TABS tablet Take 1 tablet (10 mg total) by mouth daily.   Dulaglutide (TRULICITY) 3 ZO/1.0RU SOPN Inject 3 mg as directed once a week.   fenofibrate (TRICOR) 145 MG tablet Take 1 tablet (145 mg total) by mouth daily.   insulin aspart (NOVOLOG) 100 UNIT/ML FlexPen Inject 3 units into the skin before  breakfast and lunch and 5 units before dinner.   Insulin Glargine (BASAGLAR KWIKPEN) 100 UNIT/ML Inject 36 Units into the skin at bedtime.   Insulin Pen Needle (PEN NEEDLES) 32G X 4 MM MISC 200 each by Does not apply route at bedtime.   Ipratropium-Albuterol (COMBIVENT RESPIMAT) 20-100 MCG/ACT AERS respimat Inhale 1 puff into the lungs every 6 (six) hours as needed for wheezing.   meclizine (ANTIVERT) 25 MG tablet Take by mouth 3 (three) times daily.   naproxen (NAPROSYN) 500 MG tablet Take 1 tablet (500 mg total) by mouth daily as needed.   nitroGLYCERIN (NITROSTAT) 0.4 MG SL tablet Place 1 tablet (0.4 mg total) under the tongue every 5 (five) minutes x 3 doses as needed for chest pain.   potassium chloride SA (KLOR-CON M20) 20 MEQ tablet Take 1 tablet daily   rivaroxaban (XARELTO) 20 MG TABS tablet Take 1 tablet (20 mg total) by mouth daily with supper.   rosuvastatin (CRESTOR) 40 MG tablet Take 1 tablet (40 mg total) by mouth daily.   sacubitril-valsartan (ENTRESTO) 24-26 MG Take 1 tablet by mouth 2 (two) times daily.   spironolactone (ALDACTONE) 25 MG tablet Take 0.5 tablets (12.5 mg total) by mouth daily.   sucralfate (CARAFATE) 1 g tablet TAKE 1 TABLET BY MOUTH 4 TIMES DAILY WITH MEALS AND AT BEDTIME   torsemide (DEMADEX) 20 MG tablet Take 2 tablets (40 mg total) by mouth 2 (two) times daily.   traZODone (DESYREL) 100 MG tablet Take 1 tablet (100 mg total) by mouth at bedtime.        Allergies:   Metformin and related, Prednisone, and Zantac [ranitidine hcl]    Social History         Socioeconomic History   Marital status: Widowed      Spouse name: Not on file   Number of children: 1   Years of education: 68   Highest education level: 12th grade  Occupational History   Occupation: disabled  Tobacco Use   Smoking status: Former      Packs/day: 0.50      Years: 33.00      Total pack years: 16.50      Types: Cigarettes      Quit date: 05/08/2020      Years since quitting: 1.9    Smokeless tobacco: Former      Quit date: 05/08/2020   Tobacco comments:      Quit Sept 2021  Vaping Use   Vaping Use: Former  Substance and Sexual Activity   Alcohol use: Not Currently      Comment: occassionally  Drug use: No   Sexual activity: Not Currently  Other Topics Concern   Not on file  Social History Narrative    Lives at home with wife and his dad's wife.           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     Social Determinants of Health        Financial Resource Strain: Medium Risk (01/20/2018)    Overall Financial Resource Strain (CARDIA)     Difficulty of Paying Living Expenses: Somewhat hard  Food Insecurity: Food Insecurity Present (01/20/2018)    Hunger Vital Sign     Worried About Running Out of Food in the Last Year: Often true     Ran Out of Food in the Last Year: Sometimes true  Transportation Needs: No Transportation Needs (09/02/2017)    PRAPARE - Armed forces logistics/support/administrative officer (Medical): No     Lack of Transportation (Non-Medical): No  Physical Activity: Insufficiently Active (01/20/2018)    Exercise Vital Sign     Days of Exercise per Week: 7 days     Minutes of Exercise per Session: 10 min  Stress: Stress Concern Present (09/02/2017)    [place]     Feeling of Stress : Very much  Social Connections: Moderately Isolated (09/02/2017)    Social Connection and Isolation Panel [NHANES]     Frequency of Communication with Friends and Family: Twice a week     Frequency of Social Gatherings with Friends and Family: Once a week     Attends Religious Services: Never     Marine scientist or Organizations: No     Attends Archivist Meetings: Never     Marital Status: Widowed      Family History: The patient's family history includes Cancer in his maternal grandfather and paternal aunt; Diabetes in his  maternal grandfather and mother; Diabetes Mellitus II in his mother; Gout in his father; Heart attack in his mother; Hypertension in his father and mother; Hypothyroidism in his mother; Kidney failure in his mother.   ROS:   Please see the history of present illness.    All other systems reviewed and are negative.   EKGs/Labs/Other Studies Reviewed:     The following studies were reviewed today:   February 06, 2022 echo limited EF 30 to 35% Mild to moderate AI     April 13, 2020 left and right heart catheterization Severe single-vessel coronary disease with a CTO of the proximal RCA Mild to moderate nonobstructive coronary disease involving the LAD and circumflex Severely reduced Fick cardiac output and index Moderate pulmonary hypertension   EKG on March 01, 2022 shows sinus tachycardia.  QRS duration is 98 ms.   Recent Labs: 11/05/2021: TSH 1.270 12/04/2021: B Natriuretic Peptide 243.0 01/17/2022: Magnesium 2.4 02/08/2022: ALT 26 03/01/2022: BUN 25; Creatinine, Ser 1.77; Hemoglobin 14.8; Platelets 128; Potassium 3.3; Sodium 134  Recent Lipid Panel Labs (Brief)          Component Value Date/Time    CHOL 110 11/25/2021 0620    CHOL 157 11/05/2021 1403    TRIG 153 (H) 11/25/2021 0620    HDL 17 (L) 11/25/2021 0620    HDL 28 (L) 11/05/2021 1403    CHOLHDL 6.5 11/25/2021 0620    VLDL 31 11/25/2021 0620    LDLCALC 62 11/25/2021 0620    LDLCALC  56 11/05/2021 1403    LDLDIRECT 60.6 11/23/2020 2101        Physical Exam:     VS:  BP 125/81   Pulse 86   Ht 6' (1.829 m)   Wt 240 lb (108.9 kg)   SpO2 96%   BMI 32.55 kg/m         Wt Readings from Last 3 Encounters:  04/17/22 240 lb (108.9 kg)  03/26/22 239 lb (108.4 kg)  02/14/22 239 lb (108.4 kg)      GEN:  Well nourished, well developed in no acute distress HEENT: Normal NECK: No JVD; No carotid bruits LYMPHATICS: No lymphadenopathy CARDIAC: RRR, no murmurs, rubs, gallops.  2 out of 4 diastolic murmur at the left upper  sternal border. RESPIRATORY:  Clear to auscultation without rales, wheezing or rhonchi  ABDOMEN: Soft, non-tender, non-distended MUSCULOSKELETAL:  No edema; No deformity  SKIN: Warm and dry NEUROLOGIC:  Alert and oriented x 3 PSYCHIATRIC:  Normal affect          Assessment ASSESSMENT:     1. Chronic systolic CHF (congestive heart failure) ([place])   2. NSVT (nonsustained ventricular tachycardia) (HCC)   3. Pulmonary embolism, unspecified chronicity, unspecified pulmonary embolism type, unspecified whether acute cor pulmonale present ([place])     PLAN:     In order of problems listed above:   #Chronic systolic heart failure Persistently reduced ejection fraction despite medical therapy.  Continue Coreg, Lisabeth Register, spironolactone.  Had previously been on beta-blockers but these were stopped due to marginal cardiac indices.   Discussed role of ICD for primary prevention during today's office visit.  He wishes to proceed with implant.   Plan for a subcutaneous ICD given lack of pacing indication, narrow QRS and relatively young age.   The patient has an mixed nonischemic/ischemic CM (EF 25%), NYHA Class III CHF, and CAD.  He is referred by Dr Rockey Situ for risk stratification of sudden death and consideration of ICD implantation.  At this time, he meets criteria for ICD implantation for primary prevention of sudden death.  I have had a thorough discussion with the patient reviewing options.  The patient and their family (if available) have had opportunities to ask questions and have them answered. The patient and I have decided together through a shared decision making process to proceed with ICD implant at this time.     Risks, benefits, alternatives to ICD implantation were discussed in detail with the patient today. The patient understands that the risks include but are not limited to bleeding, infection, pneumothorax, perforation, tamponade, vascular damage, renal failure, MI, stroke,  death, inappropriate shocks, and lead dislodgement and wishes to proceed.  We will therefore schedule device implantation at the next available time.   He will need to hold his anticoagulation for 3 days prior to implant.      Presents today for S-ICD implant. Procedure reviewed.     Signed, Hilton Cork. Quentin Ore, MD, [facility], [facility] 05/20/2022 Electrophysiology [facility]

## 2022-05-20 NOTE — Progress Notes (Signed)
Patient admitted to [facility] for observation. Writer gave hand off report to [place], Therapist, sports. Writer took patient to 3 E unit.

## 2022-05-20 NOTE — Transfer of Care (Signed)
Immediate Anesthesia Transfer of Care Note  Patient: Gabriel [name].  Procedure(s) Performed: SUBQ ICD IMPLANT  Patient Location: Cath Lab  Anesthesia Type:General  Level of Consciousness: awake, alert  and oriented  Airway & Oxygen Therapy: Patient Spontanous Breathing and Patient connected to nasal cannula oxygen  Post-op Assessment: Report given to RN, Post -op Vital signs reviewed and stable and Patient moving all extremities  Post vital signs: Reviewed and stable  Last Vitals:  Vitals Value Taken Time  BP 129/67 05/20/22 1149  Temp    Pulse 79 05/20/22 1153  Resp 14 05/20/22 1153  SpO2 96 % 05/20/22 1153  Vitals shown include unvalidated device data.  Last Pain:  Vitals:   05/20/22 0912  TempSrc:   PainSc: 0-No pain         Complications: There were no known notable events for this encounter.

## 2022-05-20 NOTE — Progress Notes (Signed)
Per Dr. Quentin Ore after visiting patient post procedure in short stay, MD stated he wants patient to be admitted for observation on a telemetry floor. Orders followed and bed request placed. Awaiting further orders to transfer patient to telemetry unit.

## 2022-05-20 NOTE — Anesthesia Postprocedure Evaluation (Signed)
Anesthesia Post Note  Patient: Gabriel [name].  Procedure(s) Performed: SUBQ ICD IMPLANT     Patient location during evaluation: PACU Anesthesia Type: General Level of consciousness: awake and alert Pain management: pain level controlled Vital Signs Assessment: post-procedure vital signs reviewed and stable Respiratory status: spontaneous breathing, nonlabored ventilation, respiratory function stable and patient connected to nasal cannula oxygen Cardiovascular status: blood pressure returned to baseline and stable Postop Assessment: no apparent nausea or vomiting Anesthetic complications: no   There were no known notable events for this encounter.  Last Vitals:  Vitals:   05/20/22 1440 05/20/22 1509  BP:  125/89  Pulse: 86 86  Resp: (!) 23 18  Temp:  36.6 C  SpO2: 95% 98%    Last Pain:  Vitals:   05/20/22 1509  TempSrc: Oral  PainSc: 10-Worst pain ever                 Kaydon Husby L Veva Grimley

## 2022-05-21 ENCOUNTER — Other Ambulatory Visit: Payer: Self-pay

## 2022-05-21 ENCOUNTER — Encounter (HOSPITAL_COMMUNITY): Payer: Self-pay | Admitting: Cardiology

## 2022-05-21 DIAGNOSIS — I428 Other cardiomyopathies: Secondary | ICD-10-CM | POA: Diagnosis not present

## 2022-05-21 DIAGNOSIS — I429 Cardiomyopathy, unspecified: Secondary | ICD-10-CM | POA: Diagnosis not present

## 2022-05-21 LAB — GLUCOSE, CAPILLARY: Glucose-Capillary: 151 mg/dL — ABNORMAL HIGH (ref 70–99)

## 2022-05-21 MED ORDER — HYDROCODONE-ACETAMINOPHEN 5-325 MG PO TABS: 1.0000 | ORAL_TABLET | Freq: Three times a day (TID) | ORAL | 0 refills | Status: DC | PRN

## 2022-05-21 MED ORDER — RIVAROXABAN 20 MG PO TABS: 20.0000 mg | ORAL_TABLET | Freq: Every day | ORAL | 3 refills | Status: DC

## 2022-05-21 NOTE — TOC Transition Note (Signed)
Transition of Care [facility]) - CM/SW Discharge Note   Patient Details  Name: Gabriel [name]. MRN: [id number] Date of Birth: Jun 22, 1971  Transition of Care [facility]) CM/SW Contact:  Zenon Mayo, RN Phone Number: 05/21/2022, 10:16 AM   Clinical Narrative:    Patient is for dc today, s/p ICD placed yesterday. Has no needs. Indep.         Patient Goals and CMS Choice        Discharge Placement                       Discharge Plan and Services                                     Social Determinants of Health (SDOH) Interventions     Readmission Risk Interventions    08/13/2021    9:45 AM 05/22/2021    4:19 PM 11/27/2020    8:58 AM  Readmission Risk Prevention Plan  Transportation Screening Complete Complete   PCP or Specialist Appt within 3-5 Days Complete    Social Work Consult for [place] Planning/Counseling Complete Complete   Palliative Care Screening Not Applicable Not Applicable   Medication Review Press photographer) Complete Complete   PCP or Specialist appointment within 3-5 days of discharge   Complete

## 2022-05-21 NOTE — Discharge Summary (Signed)
ELECTROPHYSIOLOGY PROCEDURE DISCHARGE SUMMARY    Patient ID: Gabriel [name].,  MRN: [id number], DOB/AGE: 53-Jan-1972 53 y.o.  Admit date: 05/20/2022 Discharge date: 05/21/2022  Primary Care Physician: [name], FNP  Primary Cardiologist: [name], MD  Electrophysiologist: Dr. Quentin Ore    Primary Diagnosis:  Chronic systolic CHF   Secondary Diagnosis:    Allergies  Allergen Reactions   Metformin And Related Nausea And Vomiting   Prednisone Other (See Comments)    Reaction: Hallucinations     Zantac [Ranitidine Hcl] Diarrhea and Nausea Only    Night sweats     Procedures This Admission:  1.  Implantation of a [employer] subcutaneous single chamber ICD on 05/20/2022 by Dr. Quentin Ore.  The patient received a [place] with lead (serial [id number]) Pt did not require an LV lead. DFTs were successful at 65 J at time of implant. There were no post procedure complications 2.  CXR on 05/20/2022 demonstrated no pneumothorax status post device implantation.  Brief HPI: Gabriel [name]. is a 51 y.o. male was referred to electrophysiology in the outpatient setting  for consideration of ICD implantation.  Past medical history includes above.  The patient has persistent LV dysfunction despite guideline directed therapy.  Risks, benefits, and alternatives to ICD implantation were reviewed with the patient who wished to proceed.   Hospital Course:  The patient was admitted and underwent implantation of a [employer] subcutaneous single chamber ICD with details as outlined above. They were monitored on telemetry overnight which demonstrated NSR .  Left flank and center chest was without hematoma or ecchymosis.  The device was interrogated and found to be functioning normally.  CXR was obtained and demonstrated no pneumothorax status post device implantation..  Wound care, arm mobility, and restrictions were reviewed with the patient.  The  patient was examined and considered stable for discharge to home.   The patient's discharge medications include an ACE-I/ARB/ARNI (entresto) and beta blocker (coreg).     Physical Exam: Vitals:   05/20/22 1931 05/21/22 0018 05/21/22 0413 05/21/22 0712  BP: 118/64 127/71 128/68 (!) 146/78  Pulse: 76 69 66 73  Resp:  '18 18 17  '$ Temp: 98.4 F (36.9 C) 98.8 F (37.1 C) 97.8 F (36.6 C) 98 F (36.7 C)  TempSrc: Oral Oral Oral Oral  SpO2: 92% 96% 99% 97%  Weight:   108.8 kg   Height:        GEN- The patient is well appearing, alert and oriented x 3 today.   HEENT: benign Lungs- Clear to ausculation bilaterally, normal work of breathing.  No wheezes, rales, rhonchi Heart- Regular rate and rhythm, no murmurs, rubs or gallops, PMI not laterally displaced GI- soft, non-tender, non-distended, bowel sounds present, no hepatosplenomegaly, obese abdomen Extremities- no clubbing, cyanosis, or edema; DP/PT/radial pulses 2+ bilaterally MS- no significant deformity or atrophy Skin- warm and dry, no rash or lesion. ICD site c/d/I, L flank and center chest outer dressings removed. Scant blood present on center chest steri-strips. Mild ecchymosis on L flank Psych- euthymic mood, full affect Neuro- strength and sensation are intact   Labs:   Lab Results  Component Value Date   WBC 6.5 05/15/2022   HGB 13.6 05/15/2022   HCT 39.9 05/15/2022   MCV 85.1 05/15/2022   PLT 131 (L) 05/15/2022    Recent Labs  Lab 05/15/22 0522  NA 142  K 4.1  CL 111  CO2 26  BUN 27*  CREATININE 1.34*  CALCIUM 9.1  GLUCOSE 106*    Discharge Medications:  Allergies as of 05/21/2022       Reactions   Metformin And Related Nausea And Vomiting   Prednisone Other (See Comments)   Reaction: Hallucinations    Zantac [ranitidine Hcl] Diarrhea, Nausea Only   Night sweats        Medication List     TAKE these medications    Basaglar KwikPen 100 UNIT/ML Inject 36 Units into the skin at bedtime.    carvedilol 3.125 MG tablet Commonly known as: COREG Take 1 tablet (3.125 mg total) by mouth 2 (two) times daily with a meal.   Combivent Respimat 20-100 MCG/ACT Aers respimat Generic drug: Ipratropium-Albuterol Inhale 1 puff into the lungs every 6 (six) hours as needed for wheezing.   dapagliflozin propanediol 10 MG Tabs tablet Commonly known as: FARXIGA Take 1 tablet (10 mg total) by mouth daily.   fenofibrate 145 MG tablet Commonly known as: TRICOR Take 1 tablet (145 mg total) by mouth daily.   FLUoxetine 20 MG capsule Commonly known as: PROZAC Take 1 capsule (20 mg total) by mouth daily.   Furoscix 80 MG/10ML Ctkt Generic drug: Furosemide Inject 1 Dose into the skin.   HYDROcodone-acetaminophen 5-325 MG tablet Commonly known as: NORCO/VICODIN Take 1 tablet by mouth every 8 (eight) hours as needed for severe pain.   insulin aspart 100 UNIT/ML FlexPen Commonly known as: NOVOLOG Inject 3 units into the skin before breakfast and lunch and 5 units before dinner.   meclizine 25 MG tablet Commonly known as: ANTIVERT Take 1 tablet (25 mg total) by mouth 3 (three) times daily as needed for dizziness.   nitroGLYCERIN 0.4 MG SL tablet Commonly known as: NITROSTAT Place 1 tablet (0.4 mg total) under the tongue every 5 (five) minutes x 3 doses as needed for chest pain.   Pen Needles 32G X 4 MM Misc 200 each by Does not apply route at bedtime.   potassium chloride SA 20 MEQ tablet Commonly known as: [place]-Con M20 Take 1 tablet by mouth daily (Take 1 tablet daily) What changed:  how much to take when to take this additional instructions   rivaroxaban 20 MG Tabs tablet Commonly known as: XARELTO Take 1 tablet (20 mg total) by mouth daily with supper. Start taking on: May 26, 2022 What changed: These instructions start on May 26, 2022. If you are unsure what to do until then, ask your doctor or other care provider.   rosuvastatin 40 MG tablet Commonly known as:  CRESTOR Take 1 tablet (40 mg total) by mouth daily.   sacubitril-valsartan 24-26 MG Commonly known as: ENTRESTO Take 1 tablet by mouth 2 (two) times daily.   spironolactone 25 MG tablet Commonly known as: ALDACTONE Take 1 tablet (25 mg total) by mouth daily.   torsemide 20 MG tablet Commonly known as: DEMADEX Take 2 tablets by mouth two times daily and take an additional 4 tablets as needed for weight gain (Take 2 tablets (40 mg total) by mouth 2 (two) times daily.)   traZODone 100 MG tablet Commonly known as: DESYREL Take 1 tablet (100 mg total) by mouth at bedtime.   Trulicity 3 FX/9.0WI Sopn Generic drug: Dulaglutide Inject 3 mg as directed once a week.        Disposition:    Follow-up Information     [facility] [place] Follow up.   Why: on 10/12 at 920 Contact  information: [place] [id number] [phone]                Duration of Discharge Encounter: Greater than 30 minutes including physician time.  Signed, Mamie Levers, NP  05/21/2022 9:52 AM

## 2022-05-21 NOTE — Progress Notes (Signed)
Opening error 

## 2022-05-23 ENCOUNTER — Telehealth: Payer: Self-pay | Admitting: Cardiology

## 2022-05-23 ENCOUNTER — Encounter: Payer: Self-pay | Admitting: Family

## 2022-05-23 ENCOUNTER — Ambulatory Visit: Payer: Medicaid Other | Attending: Family | Admitting: Family

## 2022-05-23 ENCOUNTER — Other Ambulatory Visit: Payer: Medicaid Other | Admitting: Pharmacist

## 2022-05-23 VITALS — BP 132/74 | HR 95 | Resp 16 | Ht 72.0 in | Wt 243.4 lb

## 2022-05-23 DIAGNOSIS — Z794 Long term (current) use of insulin: Secondary | ICD-10-CM | POA: Diagnosis not present

## 2022-05-23 DIAGNOSIS — Z87891 Personal history of nicotine dependence: Secondary | ICD-10-CM | POA: Diagnosis not present

## 2022-05-23 DIAGNOSIS — J4489 Other specified chronic obstructive pulmonary disease: Secondary | ICD-10-CM | POA: Diagnosis not present

## 2022-05-23 DIAGNOSIS — Z7984 Long term (current) use of oral hypoglycemic drugs: Secondary | ICD-10-CM | POA: Insufficient documentation

## 2022-05-23 DIAGNOSIS — N189 Chronic kidney disease, unspecified: Secondary | ICD-10-CM | POA: Insufficient documentation

## 2022-05-23 DIAGNOSIS — I10 Essential (primary) hypertension: Secondary | ICD-10-CM | POA: Diagnosis not present

## 2022-05-23 DIAGNOSIS — Z86711 Personal history of pulmonary embolism: Secondary | ICD-10-CM | POA: Diagnosis not present

## 2022-05-23 DIAGNOSIS — Z95810 Presence of automatic (implantable) cardiac defibrillator: Secondary | ICD-10-CM | POA: Insufficient documentation

## 2022-05-23 DIAGNOSIS — Z79899 Other long term (current) drug therapy: Secondary | ICD-10-CM | POA: Insufficient documentation

## 2022-05-23 DIAGNOSIS — R0602 Shortness of breath: Secondary | ICD-10-CM | POA: Diagnosis present

## 2022-05-23 DIAGNOSIS — Z8249 Family history of ischemic heart disease and other diseases of the circulatory system: Secondary | ICD-10-CM | POA: Insufficient documentation

## 2022-05-23 DIAGNOSIS — G8918 Other acute postprocedural pain: Secondary | ICD-10-CM | POA: Insufficient documentation

## 2022-05-23 DIAGNOSIS — I2510 Atherosclerotic heart disease of native coronary artery without angina pectoris: Secondary | ICD-10-CM | POA: Insufficient documentation

## 2022-05-23 DIAGNOSIS — Z833 Family history of diabetes mellitus: Secondary | ICD-10-CM | POA: Diagnosis not present

## 2022-05-23 DIAGNOSIS — I252 Old myocardial infarction: Secondary | ICD-10-CM | POA: Insufficient documentation

## 2022-05-23 DIAGNOSIS — E1122 Type 2 diabetes mellitus with diabetic chronic kidney disease: Secondary | ICD-10-CM | POA: Insufficient documentation

## 2022-05-23 DIAGNOSIS — I130 Hypertensive heart and chronic kidney disease with heart failure and stage 1 through stage 4 chronic kidney disease, or unspecified chronic kidney disease: Secondary | ICD-10-CM | POA: Diagnosis not present

## 2022-05-23 DIAGNOSIS — R109 Unspecified abdominal pain: Secondary | ICD-10-CM

## 2022-05-23 DIAGNOSIS — N1832 Chronic kidney disease, stage 3b: Secondary | ICD-10-CM | POA: Diagnosis not present

## 2022-05-23 DIAGNOSIS — I5022 Chronic systolic (congestive) heart failure: Secondary | ICD-10-CM | POA: Insufficient documentation

## 2022-05-23 NOTE — Telephone Encounter (Signed)
Patient calling in to say that he still have swelling and burning from is procedure. Also that it is still hurting. Please advise

## 2022-05-23 NOTE — Telephone Encounter (Signed)
Dr. Quentin Ore would like to have the patient come in for a early wound check appointment. Advised the patient that the device clinic will see him for a wound check appointment on Oct 9 at 9 am.  Patient verbalized understanding and agreement.

## 2022-05-23 NOTE — Progress Notes (Signed)
Patient ID: Gabriel [name]., male    DOB: 1971-05-09, 51 y.o.   MRN: [id number]  HPI  Mr Mcenery is a 51 y/o male with a history of HTN, diabetes, COPD, CKD, asthma, previous tobacco use and chronic heart failure.    Echo report from 02/06/22 reviewed and showed an EF of 30-35% along with mild/moderate AR. Echo report from 11/24/21 reviewed and showed an EF of 20-25% along with mild LVH and mild MR. Echo results from 05/21/21 reviewed and showed an EF of 40-45% along with mild LVH and mild MR. Echo report from 05/21/21 reviewed and showed an EF of 40-45% along with mild LVH and mild MR. Echo report from 06/08/20 reviewed and showed an EF of 35-40% along with mildly elevated PA pressure of 44.7 mmHg, moderate LAE and mild/moderate MR. Echo report from 11/23/19 reviewed and showed an EF of 25-30% with mild LVH and moderately elevated PA Pressure. Echo report from 06/26/2019 reviewed and showed an EF of 30-35% along with moderately elevated PA pressure. Echo done 04/29/17 showed EF 45-50%. Echo report that was done 11/28/16 and it showed an EF of 40-45% along with mild MR. EF has improved from 30-35% July 2017.   RHC/LHC done 06/13/20 and showed: Severe single-vessel coronary artery disease with chronic total occlusion of the proximal RCA.  The distal vessel fills via left to right collaterals. Mild to moderate, nonobstructive CAD involving the LAD and LCx with up to 50% stenosis. Mildly elevated left and right heart filling pressures. Moderate pulmonary hypertension (mean PAP 40 mmHg) with significantly elevated pulmonary vascular resistance (PVR 7.4 WU). Severely reduced Fick cardiac output/index.   Admitted 05/20/22 for ICD placement and discharged the next day. Admitted 05/13/22 due to syncope. Given IVF. Chest CT negative for PE. Discharged after 2 days. Was in the ED 03/01/22 due to acute viral syndrome where he was treated and released. Admitted 01/17/22 due to nausea and vomiting, polydipsia, polyuria. Blood  glucose was elevated at 534. Creatinine was 2.42. Cardiology consult obtained. Given IVF and insulin. Lantus increased. Discharged after 2 days. Was in the ED 12/04/21 due to acute on chronic heart failure where he was treated and released. Admitted 11/24/21 due to SOB and abdominal pain. CT scan of the abdomen and liver ultrasound were concerning for cholecystitis.  However, HIDA scan did not show any evidence of cholecystitis. Cardiology and surgical consults obtained. Initially given IV lasix with transition to oral diuretics. Started on spironolactone. Discharged after 4 days.   He presents today for a follow-up visit with a chief complaint of moderate shortness of breath with little exertion. Describes this as chronic although worse since he's in so much pain from his recent ICD insertion 3 days ago. He has associated fatigue, cough, chest pain, occasional dizziness, bruising on abdomen/neck and severe pain at ICD insertion site (left flank area) along with swelling at insertion site. He denies any pedal edema, palpitations, abdominal distention or weight gain.   He says that his pain at the ICD insertion site if "off the charts" and is affecting his breathing. He has been taking the pain medication prescribed but says that it's not helping at all and that he doesn't have anymore because he's taken them sooner than the 8 hours.   Past Medical History:  Diagnosis Date   CAD (coronary artery disease)    a. 04/2015 low risk MV;  b. 12/2016 Cath: minor irregs in LAD/Diag/LCX/OM, RCA 40p/m/d; c. 05/2020 Cath: LM nl, LAD 50d, LCX  30p, OM1 40, RCA 100p w/ L->R collats. CO/CI 3.1/1.3-->Med rx.   Chest wall pain, chronic    Chronic Troponin Elevation    CKD (chronic kidney disease), stage II-III    COPD (chronic obstructive pulmonary disease) (HCC)    Diabetes mellitus without complication ([place])    HFrEF (heart failure with reduced ejection fraction) ([place])    a. 03/2015 Echo: EF 45-50%; b. 12/2015 Echo: EF  20-25%; c. 02/2016 Echo: EF 30-35%; d. 11/2016 Echo: EF 40-45%; e. 06/2019 Echo: EF 30-35%; f. 11/2019 Echo: EF 25-30%; g. 05/2020 Echo: EF 35-40%; h. 05/2021 Echo: EF 40-45%; i. 11/2021 Echo: EF 20-25%, glob HK, mild LVH, GrIII DD, sev red RV fxn, mild MR.   Hypertension    Mitral regurgitation    Mild to moderate by October 2021 echocardiogram.   Mixed Ischemic & NICM (nonischemic cardiomyopathy) ([place])    a. 03/2015 Echo: EF 45-50%; b. 12/2015 Echo: EF 20-25%; c. 02/2016 Echo: EF 30-35%; d. 11/2016 Echo: EF 40-45%; e. 06/2019 Echo: EF 30-35%; f. 11/2019 Echo: EF 25-30%; g. 05/2020 Echo: EF 35-40%; h. 05/2021 Echo: EF 40-45%; i. 11/2021 Echo: EF 20-25%   Myocardial infarct (HCC)    NSVT (nonsustained ventricular tachycardia) ([place])    a. 12/2015 noted on tele-->amio;  b. 12/2015 Event monitor: no VT noted.   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  12/31/19: RUL and RLL PEs.   Syncope    a. 01/2016 - felt to be vasovagal.   Past Surgical History:  Procedure Laterality Date   AMPUTATION     CARDIAC CATHETERIZATION     FINGER AMPUTATION     Traumatic   FINGER FRACTURE SURGERY Left    LEFT HEART CATH AND CORONARY ANGIOGRAPHY N/A 01/06/2017   Procedure: Left Heart Cath and Coronary Angiography;  Surgeon: Wellington Hampshire, MD;  Location: [facility];  Service: Cardiovascular;  Laterality: N/A;   RIGHT/LEFT HEART CATH AND CORONARY ANGIOGRAPHY N/A 06/13/2020   Procedure: RIGHT/LEFT HEART CATH AND CORONARY ANGIOGRAPHY;  Surgeon: Nelva Bush, MD;  Location: [facility];  Service: Cardiovascular;  Laterality: N/A;   SUBQ ICD IMPLANT N/A 05/20/2022   Procedure: SUBQ ICD IMPLANT;  Surgeon: Vickie Epley, MD;  Location: [facility];  Service: Cardiovascular;  Laterality: N/A;   Family History  Problem Relation Age of Onset   Diabetes Mother    Diabetes Mellitus II Mother    Hypothyroidism Mother     Hypertension Mother    Kidney failure Mother        Dialysis   Heart attack Mother        58 yo approximately   Hypertension Father    Gout Father    Cancer Maternal Grandfather    Diabetes Maternal Grandfather    Cancer Paternal Aunt    Social History   Tobacco Use   Smoking status: Former    Packs/day: 0.50    Years: 33.00    Total pack years: 16.50    Types: Cigarettes    Quit date: 05/08/2020    Years since quitting: 2.0   Smokeless tobacco: Former    Quit date: 05/08/2020   Tobacco comments:    Quit Sept 2021  Substance Use Topics   Alcohol use: Not Currently    Comment: occassionally   Allergies  Allergen Reactions   Metformin And Related Nausea And Vomiting   Prednisone Other (See Comments)  Reaction: Hallucinations     Zantac [Ranitidine Hcl] Diarrhea and Nausea Only    Night sweats   Prior to Admission medications   Medication Sig Start Date End Date Taking? Authorizing Provider  carvedilol (COREG) 3.125 MG tablet Take 1 tablet (3.125 mg total) by mouth 2 (two) times daily with a meal. 02/14/22  Yes Theora Gianotti, NP  dapagliflozin propanediol (FARXIGA) 10 MG TABS tablet Take 1 tablet (10 mg total) by mouth daily. 05/09/22  Yes Tomasa Dobransky A, FNP  fenofibrate (TRICOR) 145 MG tablet Take 1 tablet (145 mg total) by mouth daily. 01/15/22  Yes Ajna Moors, Otila Kluver A, FNP  FLUoxetine (PROZAC) 20 MG capsule Take 1 capsule (20 mg total) by mouth daily. 02/08/22  Yes [name], FNP  insulin aspart (NOVOLOG) 100 UNIT/ML FlexPen Inject 3 units into the skin before breakfast and lunch and 5 units before dinner. 01/21/22  Yes Swayze, Ava, DO  Insulin Glargine (BASAGLAR KWIKPEN) 100 UNIT/ML Inject 36 Units into the skin at bedtime. 01/19/22 02/09/23 Yes Swayze, Ava, DO  Insulin Pen Needle (PEN NEEDLES) 32G X 4 MM MISC 200 each by Does not apply route at bedtime. 05/09/22  Yes Tally Joe T, FNP  Ipratropium-Albuterol (COMBIVENT RESPIMAT) 20-100 MCG/ACT AERS respimat  Inhale 1 puff into the lungs every 6 (six) hours as needed for wheezing.   Yes [provider]  meclizine (ANTIVERT) 25 MG tablet Take 1 tablet (25 mg total) by mouth 3 (three) times daily as needed for dizziness. 05/15/22  Yes Patrecia Pour, MD  nitroGLYCERIN (NITROSTAT) 0.4 MG SL tablet Place 1 tablet (0.4 mg total) under the tongue every 5 (five) minutes x 3 doses as needed for chest pain. 06/09/20  Yes Lorella Nimrod, MD  potassium chloride SA (KLOR-CON M20) 20 MEQ tablet Take 1 tablet daily Patient taking differently: 20 mEq every other day. 01/15/22  Yes [name] A, FNP  rosuvastatin (CRESTOR) 40 MG tablet Take 1 tablet (40 mg total) by mouth daily. 04/01/22  Yes Gollan, Kathlene November, MD  sacubitril-valsartan (ENTRESTO) 24-26 MG Take 1 tablet by mouth 2 (two) times daily. 12/20/21  Yes [name] A, FNP  spironolactone (ALDACTONE) 25 MG tablet Take 1 tablet (25 mg total) by mouth daily. 04/25/22  Yes Tyress Loden, Otila Kluver A, FNP  torsemide (DEMADEX) 20 MG tablet Take 2 tablets (40 mg total) by mouth 2 (two) times daily. 01/15/22  Yes [name] A, FNP  traZODone (DESYREL) 100 MG tablet Take 1 tablet (100 mg total) by mouth at bedtime. 02/08/22  Yes [name], FNP  Furosemide (FUROSCIX) 80 MG/10ML CTKT Inject 1 Dose into the skin. Patient not taking: Reported on 05/23/2022    [provider]  HYDROcodone-acetaminophen (NORCO/VICODIN) 5-325 MG tablet Take 1 tablet by mouth every 8 (eight) hours as needed for severe pain. Patient not taking: Reported on 05/23/2022 05/21/22   Shirley Friar, PA-C  rivaroxaban (XARELTO) 20 MG TABS tablet Take 1 tablet (20 mg total) by mouth daily with supper. Patient not taking: Reported on 05/23/2022 05/26/22   Shirley Friar, PA-C  insulin lispro (HUMALOG) 100 UNIT/ML KwikPen Inject 3 units before breakfast and lunch. 5 units before dinner. 01/19/22 01/21/22  Swayze, Ava, DO    Review of Systems  Constitutional:  Positive for fatigue.  Negative for appetite change.  HENT:  Negative for congestion, postnasal drip and sore throat.   Eyes: Negative.   Respiratory:  Positive for cough and shortness of breath (worsening due to pain). Negative  for chest tightness.   Cardiovascular:  Positive for chest pain ("usual"). Negative for palpitations and leg swelling.  Gastrointestinal:  Negative for abdominal distention and abdominal pain.  Endocrine: Negative.   Genitourinary: Negative.   Musculoskeletal:  Positive for arthralgias ("severe pain at ICD insertion site").       Swelling at ICD insertion site  Skin:  Positive for wound (ICD insertion site at left flank area).  Allergic/Immunologic: Negative.   Neurological:  Positive for dizziness. Negative for light-headedness.  Hematological:  Negative for adenopathy. Does not bruise/bleed easily.  Psychiatric/Behavioral:  Negative for dysphoric mood and sleep disturbance. The patient is nervous/anxious (due to pain).    Vitals:   05/23/22 0837  BP: 132/74  Pulse: 95  Resp: 16  SpO2: 100%  Weight: 243 lb 6 oz (110.4 kg)  Height: 6' (1.829 m)   Wt Readings from Last 3 Encounters:  05/23/22 243 lb 6 oz (110.4 kg)  05/21/22 239 lb 12.8 oz (108.8 kg)  05/13/22 237 lb (107.5 kg)   Lab Results  Component Value Date   CREATININE 1.34 (H) 05/15/2022   CREATININE 1.38 (H) 05/14/2022   CREATININE 1.59 (H) 05/13/2022   Physical Exam Vitals and nursing note reviewed.  Constitutional:      Appearance: He is well-developed.  HENT:     Head: Normocephalic and atraumatic.  Cardiovascular:     Rate and Rhythm: Regular rhythm.  Pulmonary:     Effort: Pulmonary effort is normal. No accessory muscle usage.     Breath sounds: No wheezing, rhonchi or rales.  Abdominal:     General: There is no distension.     Palpations: Abdomen is soft.     Tenderness: There is abdominal tenderness (reports tenderness around ICD insertion site).     Comments: Swelling noted around ICD insertion  site at left flank area  Musculoskeletal:     Right lower leg: No tenderness. No edema.     Left lower leg: No tenderness. No edema.  Skin:    General: Skin is warm and dry.     Findings: Bruising (around ICD insertion site and under neck) present.  Neurological:     General: No focal deficit present.     Mental Status: He is alert and oriented to person, place, and time.  Psychiatric:        Mood and Affect: Mood normal.        Behavior: Behavior normal.    Assessment & Plan:   1: Chronic heart failure with reduced ejection fraction- - NYHA class II - euvolemic today - weighing daily and reminded to call for an overnight weight gain of >2 pounds or a weekly weight gain of >5 pounds - weight up 1.4 pounds from last visit here 1 month ago  - not adding salt and he was encouraged to closely follow a '2000mg'$  sodium diet - saw cardiology Barbarann Ehlers) 02/14/22 - on GDMT of carvedilol, farxiga, spironolactone & entresto  - saw EP Quentin Ore) 04/17/22; ICD placed on 05/20/22 - at next visit, will discuss dry weight and how to adjust diuretic if needed; due to severe pain that he's in today, not sure he would remember any changes made today - [facility] - BNP from 12/04/21 was 243.0   2: HTN- - BP mildly elevated (132/74) - saw PCP Rollene Rotunda) 05/09/22 - BMP from 05/15/22 reviewed and showed sodium 142, potassium 4.1, creatinine 1.34 and GFR >60   3: Diabetes-   -  A1c was 7.5% on 05/14/22 - hasn't been able to check his glucose at home because he can't afford the test strips - saw nephrology Lanora Manis) 03/26/22  4: Left flank pain- - ICD was placed in left flank area on 05/20/22 and he's having severe pain around the site - has noticed swelling and bruising as well - explained that I didn't know if this was normal swelling/ bruising because I don't usually see patients so soon after ICD insertion - emphasized that he call Dr Mardene Speak office for guidance - I also sent  a message to Dr Quentin Ore and he has asked his nurse to see if they can get him in for device/ wound check       Patient did not bring his medications nor a list. Each medication was verbally reviewed with the patient and he was encouraged to bring the bottles to every visit to confirm accuracy of list.   Return in 2 weeks, sooner if needed.

## 2022-05-23 NOTE — Patient Instructions (Signed)
Continue weighing daily and call for an overnight weight gain of 3 pounds or more or a weekly weight gain of more than 5 pounds.   If you have voicemail, please make sure your mailbox is cleaned out so that we may leave a message and please make sure to listen to any voicemails.     

## 2022-05-24 ENCOUNTER — Other Ambulatory Visit: Payer: Medicaid Other | Admitting: Pharmacist

## 2022-05-24 NOTE — Progress Notes (Signed)
Care Coordination Call  Contacted patient to follow up on transition from [place] to [place]. He reports he has taken 2 doses of Ozempic 0.25 mg weekly and denies intolerability. As previously discussed with Tally Joe, NP, he will increase to 0.5 mg weekly next week.   Discussed that per his Medicaid plan, his PA for Elenor Legato was denied because they believe he has a Scientist, physiological. Patient denies this. When I spoke with the PA team for his insurance yesterday, they noted he would need to call Medicaid to discuss why it appears he has a second Buyer, retail. Patient is going to call his insurance next week for further clarification.   We will touch base next week.   Catie Hedwig Morton, PharmD, [facility] [phone]

## 2022-05-27 ENCOUNTER — Ambulatory Visit: Payer: Medicaid Other | Attending: Internal Medicine

## 2022-05-27 DIAGNOSIS — I5022 Chronic systolic (congestive) heart failure: Secondary | ICD-10-CM

## 2022-05-27 NOTE — Telephone Encounter (Signed)
Pt seen in clinic

## 2022-05-27 NOTE — Progress Notes (Signed)
Appointment to check wound site.  Pt continues to have discomfort, but feels it is improving from last week.    Wound site assessed by Dr. Caryl Comes.  Appears to be healing normally.  Will schedule f/u with Dr. Quentin Ore 05/29/2022 at 9:00 am in [place] to reassess.  Pt will call device clinic tomorrow to cancel if pain continues to improve.

## 2022-05-28 ENCOUNTER — Telehealth: Payer: Self-pay

## 2022-05-28 NOTE — Telephone Encounter (Signed)
Pt called to let Sonia Baller know he does not need to see Dr. Quentin Ore tomorrow. He will just come to the device clinic on 10/12.

## 2022-05-28 NOTE — Telephone Encounter (Signed)
Cancelled appt with Dr. Quentin Ore for 05/29/22 per Pt request.  Pt will keep wound check appt.

## 2022-05-29 ENCOUNTER — Ambulatory Visit: Payer: Medicaid Other | Admitting: Cardiology

## 2022-05-30 ENCOUNTER — Other Ambulatory Visit: Payer: Medicaid Other | Admitting: Pharmacist

## 2022-05-30 ENCOUNTER — Ambulatory Visit: Payer: Medicaid Other | Attending: Cardiology

## 2022-05-30 DIAGNOSIS — I5022 Chronic systolic (congestive) heart failure: Secondary | ICD-10-CM

## 2022-05-30 DIAGNOSIS — I428 Other cardiomyopathies: Secondary | ICD-10-CM | POA: Diagnosis not present

## 2022-05-30 DIAGNOSIS — E1169 Type 2 diabetes mellitus with other specified complication: Secondary | ICD-10-CM

## 2022-05-30 LAB — CUP PACEART INCLINIC DEVICE CHECK
Date Time Interrogation Session: 20231012095803
Implantable Lead Implant Date: 20231002
Implantable Lead Location: 753862
Implantable Lead Model: 3501
Implantable Lead Serial Number: 221766
Implantable Pulse Generator Implant Date: 20231002
Pulse Gen Serial Number: 189685

## 2022-05-30 MED ORDER — FREESTYLE LIBRE 3 SENSOR MISC: 3 refills | Status: DC

## 2022-05-30 NOTE — Progress Notes (Signed)
Wound check appointment. Steri-strips removed. Wound without redness or edema. Incision edges approximated, wound well healed. Normal device function.  Subcutaneous ICD check in clinic. 0 episodes. Electrode impedance status okay. No programming changes. Remaining longevity to ERI 100%. Home remotes scheduled and 91 day follow up with implanting physician scheduled. Shock plan reviewed.

## 2022-05-30 NOTE — Progress Notes (Signed)
Care Coordination Call  Contacted patient for follow up. He notes that he contacted his insurance to verify that he no longer has a Nature conservation officer, just the [employer].   [employer]. They are now saying his PA for Elenor Legato 3 was denied on 10/5 because additional information was needed from the provider (does the patient need to adjust insulin doses based on blood sugar levels). I provided this information verbally and asked that the PA be re-opened instead of requiring [place] to complete an appeal. Was able to complete a Peer to Peer over the phone and PA was approved for [place] 3 sensors.   Script sent to the pharmacy per standing order.   Catie Hedwig Morton, PharmD, [facility] [phone]

## 2022-05-30 NOTE — Patient Instructions (Addendum)
   After Your ICD (Implantable Cardiac Defibrillator)    Monitor your defibrillator site for redness, swelling, and drainage. Call the device clinic at [phone] if you experience these symptoms or fever/chills.  Your incision was closed with Steri-strips or staples:  You may shower 7 days after your procedure and wash your incision with soap and water. Avoid lotions, ointments, or perfumes over your incision until it is well-healed.  You may use a hot tub or a pool after your wound check appointment if the incision is completely closed.  Your ICD is designed to protect you from life threatening heart rhythms. Because of this, you may receive a shock.   1 shock with no symptoms:  Call the office during business hours. 1 shock with symptoms (chest pain, chest pressure, dizziness, lightheadedness, shortness of breath, overall feeling unwell):  Call 911. If you experience 2 or more shocks in 24 hours:  Call 911. If you receive a shock, you should not drive.  [place] DMV - no driving for 6 months if you receive appropriate therapy from your ICD.   ICD Alerts:  Some alerts are vibratory and others beep. These are NOT emergencies. Please call our office to let us know. If this occurs at night or on weekends, it can wait until the next business day. Send a remote transmission.   Remote monitoring is used to monitor your ICD from home. This monitoring is scheduled every 91 days by our office. It allows [place] to keep an eye on the functioning of your device to ensure it is working properly. You will routinely see your Electrophysiologist annually (more often if necessary).

## 2022-05-31 ENCOUNTER — Ambulatory Visit (INDEPENDENT_AMBULATORY_CARE_PROVIDER_SITE_OTHER): Payer: Medicaid Other | Admitting: Family Medicine

## 2022-05-31 ENCOUNTER — Encounter: Payer: Self-pay | Admitting: Family Medicine

## 2022-05-31 VITALS — BP 112/74 | HR 87 | Resp 16 | Ht 72.0 in | Wt 243.0 lb

## 2022-05-31 DIAGNOSIS — R0609 Other forms of dyspnea: Secondary | ICD-10-CM | POA: Diagnosis not present

## 2022-05-31 DIAGNOSIS — L409 Psoriasis, unspecified: Secondary | ICD-10-CM | POA: Diagnosis not present

## 2022-05-31 DIAGNOSIS — N1832 Chronic kidney disease, stage 3b: Secondary | ICD-10-CM | POA: Diagnosis not present

## 2022-05-31 DIAGNOSIS — I5022 Chronic systolic (congestive) heart failure: Secondary | ICD-10-CM

## 2022-05-31 DIAGNOSIS — Z794 Long term (current) use of insulin: Secondary | ICD-10-CM | POA: Diagnosis not present

## 2022-05-31 DIAGNOSIS — E0822 Diabetes mellitus due to underlying condition with diabetic chronic kidney disease: Secondary | ICD-10-CM

## 2022-05-31 DIAGNOSIS — R0602 Shortness of breath: Secondary | ICD-10-CM | POA: Insufficient documentation

## 2022-05-31 DIAGNOSIS — Z95810 Presence of automatic (implantable) cardiac defibrillator: Secondary | ICD-10-CM | POA: Insufficient documentation

## 2022-05-31 MED ORDER — CLOBETASOL PROPIONATE 0.05 % EX SOLN: 1.0000 | Freq: Two times a day (BID) | CUTANEOUS | 1 refills | Status: DC

## 2022-05-31 MED ORDER — CLOBETASOL PROPIONATE 0.05 % EX OINT: 1.0000 | TOPICAL_OINTMENT | Freq: Two times a day (BID) | CUTANEOUS | 1 refills | Status: DC

## 2022-05-31 NOTE — Assessment & Plan Note (Signed)
Chronic, stable Freestyle Libre sensor set up with assistance of Alex CCM pharmacist Discussed checking BG 90-120 mins following meals to adjust nutritional boluses

## 2022-05-31 NOTE — Assessment & Plan Note (Signed)
Chronic, improved S/p ICD placement Working on being more active and controlling BG

## 2022-05-31 NOTE — Assessment & Plan Note (Signed)
Acute on chronic, associated with HFrEF Request for referral to pulm

## 2022-05-31 NOTE — Progress Notes (Signed)
Established patient visit   Patient: Gabriel [name].   DOB: 02-08-1971   51 y.o. Male  MRN: [id number] Visit Date: 05/31/2022  Today's healthcare provider: [name], FNP   I,Tiffany J Bragg,acting as a scribe for [name], FNP.,have documented all relevant documentation on the behalf of [name], FNP,as directed by  [name], FNP while in the presence of [name], FNP.   Chief Complaint  Patient presents with   Hospitalization Follow-up   Subjective    HPI  Follow up Hospitalization  Patient was admitted to [facility] on 10/2 and discharged on 10/3. He was treated for Chronic systolic heart failure. Treatment for this included put in defibrilator. Telephone follow up was done on 10/5 He reports excellent compliance with treatment. He reports this condition is improved.  ----------------------------------------------------------------------------------------- -   Medications: Outpatient Medications Prior to Visit  Medication Sig   carvedilol (COREG) 3.125 MG tablet Take 1 tablet (3.125 mg total) by mouth 2 (two) times daily with a meal.   Continuous Blood Gluc Sensor (FREESTYLE LIBRE 3 SENSOR) MISC Place 1 sensor on the skin every 14 days. Use to check glucose continuously   dapagliflozin propanediol (FARXIGA) 10 MG TABS tablet Take 1 tablet (10 mg total) by mouth daily.   fenofibrate (TRICOR) 145 MG tablet Take 1 tablet (145 mg total) by mouth daily.   FLUoxetine (PROZAC) 20 MG capsule Take 1 capsule (20 mg total) by mouth daily.   Furosemide (FUROSCIX) 80 MG/10ML CTKT Inject 1 Dose into the skin.   insulin aspart (NOVOLOG) 100 UNIT/ML FlexPen Inject 3 units into the skin before breakfast and lunch and 5 units before dinner.   Insulin Glargine (BASAGLAR KWIKPEN) 100 UNIT/ML Inject 36 Units into the skin at bedtime.   Insulin Pen Needle (PEN NEEDLES) 32G X 4 MM MISC 200 each by Does not apply route at bedtime.   Ipratropium-Albuterol  (COMBIVENT RESPIMAT) 20-100 MCG/ACT AERS respimat Inhale 1 puff into the lungs every 6 (six) hours as needed for wheezing.   meclizine (ANTIVERT) 25 MG tablet Take 1 tablet (25 mg total) by mouth 3 (three) times daily as needed for dizziness.   nitroGLYCERIN (NITROSTAT) 0.4 MG SL tablet Place 1 tablet (0.4 mg total) under the tongue every 5 (five) minutes x 3 doses as needed for chest pain.   potassium chloride SA (KLOR-CON M20) 20 MEQ tablet Take 1 tablet daily (Patient taking differently: 20 mEq every other day.)   rivaroxaban (XARELTO) 20 MG TABS tablet Take 1 tablet (20 mg total) by mouth daily with supper.   rosuvastatin (CRESTOR) 40 MG tablet Take 1 tablet (40 mg total) by mouth daily.   sacubitril-valsartan (ENTRESTO) 24-26 MG Take 1 tablet by mouth 2 (two) times daily.   Semaglutide,0.25 or 0.'5MG'$ /DOS, (OZEMPIC, 0.25 OR 0.5 MG/DOSE,) 2 MG/1.5ML SOPN Inject 0.5 mg into the skin once a week.   spironolactone (ALDACTONE) 25 MG tablet Take 1 tablet (25 mg total) by mouth daily.   torsemide (DEMADEX) 20 MG tablet Take 2 tablets (40 mg total) by mouth 2 (two) times daily.   HYDROcodone-acetaminophen (NORCO/VICODIN) 5-325 MG tablet Take 1 tablet by mouth every 8 (eight) hours as needed for severe pain. (Patient not taking: Reported on 05/31/2022)   No facility-administered medications prior to visit.    Review of Systems    Objective    BP 112/74 (BP Location: Right Arm, Patient Position: Sitting, Cuff Size: Normal)   Pulse  87   Resp 16   Ht 6' (1.829 m)   Wt 243 lb (110.2 kg)   SpO2 98%   BMI 32.96 kg/m   Physical Exam Vitals and nursing note reviewed.  Constitutional:      Appearance: Normal appearance. He is obese.  HENT:     Head: Normocephalic and atraumatic.  Eyes:     Pupils: Pupils are equal, round, and reactive to light.  Cardiovascular:     Rate and Rhythm: Normal rate and regular rhythm.     Pulses: Normal pulses.     Heart sounds: Normal heart sounds.  Pulmonary:      Effort: Pulmonary effort is normal.     Breath sounds: Normal breath sounds.  Musculoskeletal:        General: Normal range of motion.     Cervical back: Normal range of motion.  Skin:    General: Skin is warm and dry.     Capillary Refill: Capillary refill takes less than 2 seconds.  Neurological:     General: No focal deficit present.     Mental Status: He is alert and oriented to person, place, and time. Mental status is at baseline.  Psychiatric:        Mood and Affect: Mood normal.        Behavior: Behavior normal.        Thought Content: Thought content normal.        Judgment: Judgment normal.     No results found for any visits on 05/31/22.  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, with long-term current use of insulin ([place])    Chronic, stable Freestyle Libre sensor set up with assistance of [place] pharmacist Discussed checking BG 90-120 mins following meals to adjust nutritional boluses         Musculoskeletal and Integument   Psoriasis    Acute on chronic, request for topically scalp and skin creams      Relevant Medications   clobetasol (TEMOVATE) 0.05 % external solution   clobetasol ointment (TEMOVATE) 0.05 %   Other Relevant Orders   Ambulatory referral to Dermatology     Other   DOE (dyspnea on exertion)    Acute on chronic, associated with HFrEF Request for referral to pulm      ICD (implantable cardioverter-defibrillator) in place    Site is soft, free from hematoma or discharge; faint bruising remains. Skin glue remains. Aware of arm restrictions s/p placement. Followed by cards, HF and EP        Return in  about 8 weeks (around 07/26/2022) for chonic disease management.      Vonna Kotyk, FNP, have reviewed all documentation for this visit. The documentation on 05/31/22 for the exam, diagnosis, procedures, and orders are all accurate and complete.   Billing reflects outreach to [facility] pharmacist and HF NP for confirmation of dry weight and assistance with CGM.  [name], [place] [phone] (phone) [phone] (fax)  [place]

## 2022-05-31 NOTE — Assessment & Plan Note (Signed)
Acute on chronic, request for topically scalp and skin creams

## 2022-05-31 NOTE — Assessment & Plan Note (Signed)
Site is soft, free from hematoma or discharge; faint bruising remains. Skin glue remains. Aware of arm restrictions s/p placement. Followed by cards, HF and EP

## 2022-06-07 ENCOUNTER — Encounter: Payer: Self-pay | Admitting: Family

## 2022-06-07 ENCOUNTER — Ambulatory Visit: Payer: Medicaid Other | Attending: Family | Admitting: Family

## 2022-06-07 VITALS — BP 154/87 | HR 78 | Resp 20 | Ht 72.0 in | Wt 245.1 lb

## 2022-06-07 DIAGNOSIS — J449 Chronic obstructive pulmonary disease, unspecified: Secondary | ICD-10-CM | POA: Diagnosis not present

## 2022-06-07 DIAGNOSIS — Z95810 Presence of automatic (implantable) cardiac defibrillator: Secondary | ICD-10-CM | POA: Insufficient documentation

## 2022-06-07 DIAGNOSIS — R079 Chest pain, unspecified: Secondary | ICD-10-CM | POA: Insufficient documentation

## 2022-06-07 DIAGNOSIS — H538 Other visual disturbances: Secondary | ICD-10-CM | POA: Insufficient documentation

## 2022-06-07 DIAGNOSIS — E1122 Type 2 diabetes mellitus with diabetic chronic kidney disease: Secondary | ICD-10-CM

## 2022-06-07 DIAGNOSIS — I5022 Chronic systolic (congestive) heart failure: Secondary | ICD-10-CM

## 2022-06-07 DIAGNOSIS — I10 Essential (primary) hypertension: Secondary | ICD-10-CM

## 2022-06-07 DIAGNOSIS — J4489 Other specified chronic obstructive pulmonary disease: Secondary | ICD-10-CM | POA: Insufficient documentation

## 2022-06-07 DIAGNOSIS — N1832 Chronic kidney disease, stage 3b: Secondary | ICD-10-CM | POA: Diagnosis not present

## 2022-06-07 DIAGNOSIS — Z7984 Long term (current) use of oral hypoglycemic drugs: Secondary | ICD-10-CM | POA: Diagnosis not present

## 2022-06-07 DIAGNOSIS — N189 Chronic kidney disease, unspecified: Secondary | ICD-10-CM | POA: Insufficient documentation

## 2022-06-07 DIAGNOSIS — R0602 Shortness of breath: Secondary | ICD-10-CM | POA: Insufficient documentation

## 2022-06-07 DIAGNOSIS — R42 Dizziness and giddiness: Secondary | ICD-10-CM | POA: Diagnosis not present

## 2022-06-07 DIAGNOSIS — Z794 Long term (current) use of insulin: Secondary | ICD-10-CM

## 2022-06-07 DIAGNOSIS — Z87891 Personal history of nicotine dependence: Secondary | ICD-10-CM | POA: Diagnosis not present

## 2022-06-07 DIAGNOSIS — G8929 Other chronic pain: Secondary | ICD-10-CM | POA: Insufficient documentation

## 2022-06-07 DIAGNOSIS — I130 Hypertensive heart and chronic kidney disease with heart failure and stage 1 through stage 4 chronic kidney disease, or unspecified chronic kidney disease: Secondary | ICD-10-CM | POA: Diagnosis not present

## 2022-06-07 NOTE — Progress Notes (Signed)
Patient ID: Gabriel Ernsberger., male    DOB: Jul 13, 1971, 51 y.o.   MRN: [id number]  HPI  [name] is a 51 y/o male with a history of HTN, diabetes, COPD, CKD, asthma, previous tobacco use and chronic heart failure.    Echo report from 02/06/22 reviewed and showed an EF of 30-35% along with mild/moderate AR. Echo report from 11/24/21 reviewed and showed an EF of 20-25% along with mild LVH and mild MR. Echo results from 05/21/21 reviewed and showed an EF of 40-45% along with mild LVH and mild MR. Echo report from 05/21/21 reviewed and showed an EF of 40-45% along with mild LVH and mild MR. Echo report from 06/08/20 reviewed and showed an EF of 35-40% along with mildly elevated PA pressure of 44.7 mmHg, moderate LAE and mild/moderate MR. Echo report from 11/23/19 reviewed and showed an EF of 25-30% with mild LVH and moderately elevated PA Pressure. Echo report from 06/26/2019 reviewed and showed an EF of 30-35% along with moderately elevated PA pressure. Echo done 04/29/17 showed EF 45-50%. Echo report that was done 11/28/16 and it showed an EF of 40-45% along with mild MR. EF has improved from 30-35% July 2017.   RHC/LHC done 06/13/20 and showed: Severe single-vessel coronary artery disease with chronic total occlusion of the proximal RCA.  The distal vessel fills via left to right collaterals. Mild to moderate, nonobstructive CAD involving the LAD and LCx with up to 50% stenosis. Mildly elevated left and right heart filling pressures. Moderate pulmonary hypertension (mean PAP 40 mmHg) with significantly elevated pulmonary vascular resistance (PVR 7.4 WU). Severely reduced Fick cardiac output/index.   Admitted 05/20/22 for ICD placement and discharged the next day. Admitted 05/13/22 due to syncope. Given IVF. Chest CT negative for PE. Discharged after 2 days. Was in the ED 03/01/22 due to acute viral syndrome where he was treated and released. Admitted 01/17/22 due to nausea and vomiting, polydipsia, polyuria. Blood  glucose was elevated at 534. Creatinine was 2.42. Cardiology consult obtained. Given IVF and insulin. Lantus increased. Discharged after 2 days.   He presents today for a follow-up visit with a chief complaint of minimal fatigue upon moderate exertion. Describes this as chronic in nature. He has associated blurry vision, shortness of breath, intermittent chronic chest pain and intermittent dizziness along with this. He denies any difficulty sleeping, abdominal distention, palpitations, pedal edema, cough, wheezing or weight gain.   Says that the pain at his ICD insertion site is much improved from his last visit here.   Now wearing libre and his glucose has ranged from 150-mid 200's.   He self-decreased his torsemide to '40mg'$  daily as when taking it BID, he felt more dizzy. No increased fluid retention with taking the reduced dose.   Would like to see pulmonology as he was a long-term smoker. Quit smoking 2021.   Past Medical History:  Diagnosis Date   CAD (coronary artery disease)    a. 04/2015 low risk MV;  b. 12/2016 Cath: minor irregs in LAD/Diag/LCX/OM, RCA 40p/m/d; c. 05/2020 Cath: LM nl, LAD 50d, LCX 30p, OM1 40, RCA 100p w/ L->R collats. CO/CI 3.1/1.3-->Med rx.   Chest wall pain, chronic    Chronic Troponin Elevation    CKD (chronic kidney disease), stage II-III    COPD (chronic obstructive pulmonary disease) (HCC)    Diabetes mellitus without complication ([place])    HFrEF (heart failure with reduced ejection fraction) ([place])    a. 03/2015 Echo: EF 45-50%; b.  12/2015 Echo: EF 20-25%; c. 02/2016 Echo: EF 30-35%; d. 11/2016 Echo: EF 40-45%; e. 06/2019 Echo: EF 30-35%; f. 11/2019 Echo: EF 25-30%; g. 05/2020 Echo: EF 35-40%; h. 05/2021 Echo: EF 40-45%; i. 11/2021 Echo: EF 20-25%, glob HK, mild LVH, GrIII DD, sev red RV fxn, mild MR.   Hypertension    Mitral regurgitation    Mild to moderate by October 2021 echocardiogram.   Mixed Ischemic & NICM (nonischemic cardiomyopathy) ([place])    a. 03/2015  Echo: EF 45-50%; b. 12/2015 Echo: EF 20-25%; c. 02/2016 Echo: EF 30-35%; d. 11/2016 Echo: EF 40-45%; e. 06/2019 Echo: EF 30-35%; f. 11/2019 Echo: EF 25-30%; g. 05/2020 Echo: EF 35-40%; h. 05/2021 Echo: EF 40-45%; i. 11/2021 Echo: EF 20-25%   Myocardial infarct (HCC)    NSVT (nonsustained ventricular tachycardia) ([place])    a. 12/2015 noted on tele-->amio;  b. 12/2015 Event monitor: no VT noted.   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  12/31/19: RUL and RLL PEs.   Syncope    a. 01/2016 - felt to be vasovagal.   Past Surgical History:  Procedure Laterality Date   AMPUTATION     CARDIAC CATHETERIZATION     FINGER AMPUTATION     Traumatic   FINGER FRACTURE SURGERY Left    LEFT HEART CATH AND CORONARY ANGIOGRAPHY N/A 01/06/2017   Procedure: Left Heart Cath and Coronary Angiography;  Surgeon: Wellington Hampshire, MD;  Location: [place] [facility];  Service: Cardiovascular;  Laterality: N/A;   RIGHT/LEFT HEART CATH AND CORONARY ANGIOGRAPHY N/A 06/13/2020   Procedure: RIGHT/LEFT HEART CATH AND CORONARY ANGIOGRAPHY;  Surgeon: Nelva Bush, MD;  Location: [name] [facility];  Service: Cardiovascular;  Laterality: N/A;   SUBQ ICD IMPLANT N/A 05/20/2022   Procedure: SUBQ ICD IMPLANT;  Surgeon: Vickie Epley, MD;  Location: [place] [facility];  Service: Cardiovascular;  Laterality: N/A;   Family History  Problem Relation Age of Onset   Diabetes Mother    Diabetes Mellitus II Mother    Hypothyroidism Mother    Hypertension Mother    Kidney failure Mother        Dialysis   Heart attack Mother        82 yo approximately   Hypertension Father    Gout Father    Cancer Maternal Grandfather    Diabetes Maternal Grandfather    Cancer Paternal Aunt    Social History   Tobacco Use   Smoking status: Former    Packs/day: 0.50    Years: 33.00    Total pack years: 16.50    Types: Cigarettes    Quit date: 05/08/2020     Years since quitting: 2.0   Smokeless tobacco: Former    Quit date: 05/08/2020   Tobacco comments:    Quit Sept 2021  Substance Use Topics   Alcohol use: Not Currently    Comment: occassionally   Allergies  Allergen Reactions   Metformin And Related Nausea And Vomiting   Prednisone Other (See Comments)    Reaction: Hallucinations     Zantac [Ranitidine Hcl] Diarrhea and Nausea Only    Night sweats   Prior to Admission medications   Medication Sig Start Date End Date Taking? Authorizing Provider  carvedilol (COREG) 3.125 MG tablet Take 1 tablet (3.125 mg total) by mouth 2 (two) times daily with a meal. 02/14/22  Yes Theora Gianotti, NP  clobetasol (TEMOVATE)  0.05 % external solution Apply 1 Application topically 2 (two) times daily. 05/31/22  Yes [name], FNP  clobetasol ointment (TEMOVATE) 7.78 % Apply 1 Application topically 2 (two) times daily. 05/31/22  Yes [name], FNP  Continuous Blood Gluc Sensor (FREESTYLE LIBRE 3 SENSOR) MISC Place 1 sensor on the skin every 14 days. Use to check glucose continuously 05/30/22  Yes Bacigalupo, Dionne Bucy, MD  dapagliflozin propanediol (FARXIGA) 10 MG TABS tablet Take 1 tablet (10 mg total) by mouth daily. 05/09/22  Yes Merek Niu A, FNP  fenofibrate (TRICOR) 145 MG tablet Take 1 tablet (145 mg total) by mouth daily. 01/15/22  Yes Damon Hargrove, Otila Kluver A, FNP  FLUoxetine (PROZAC) 20 MG capsule Take 1 capsule (20 mg total) by mouth daily. 02/08/22  Yes [name], FNP  Furosemide (FUROSCIX) 80 MG/10ML CTKT Inject 1 Dose into the skin.   Yes [provider]  insulin aspart (NOVOLOG) 100 UNIT/ML FlexPen Inject 3 units into the skin before breakfast and lunch and 5 units before dinner. 01/21/22  Yes Swayze, Ava, DO  Insulin Glargine (BASAGLAR KWIKPEN) 100 UNIT/ML Inject 36 Units into the skin at bedtime. 01/19/22 02/09/23 Yes Swayze, Ava, DO  Insulin Pen Needle (PEN NEEDLES) 32G X 4 MM MISC 200 each by Does not apply route at  bedtime. 05/09/22  Yes Tally Joe T, FNP  Ipratropium-Albuterol (COMBIVENT RESPIMAT) 20-100 MCG/ACT AERS respimat Inhale 1 puff into the lungs every 6 (six) hours as needed for wheezing.   Yes [provider]  meclizine (ANTIVERT) 25 MG tablet Take 1 tablet (25 mg total) by mouth 3 (three) times daily as needed for dizziness. 05/15/22  Yes Patrecia Pour, MD  nitroGLYCERIN (NITROSTAT) 0.4 MG SL tablet Place 1 tablet (0.4 mg total) under the tongue every 5 (five) minutes x 3 doses as needed for chest pain. 06/09/20  Yes Lorella Nimrod, MD  potassium chloride SA (KLOR-CON M20) 20 MEQ tablet Take 1 tablet daily Patient taking differently: 20 mEq every other day. 01/15/22  Yes Alisa Graff, FNP  rivaroxaban (XARELTO) 20 MG TABS tablet Take 1 tablet (20 mg total) by mouth daily with supper. 05/26/22  Yes Shirley Friar, PA-C  rosuvastatin (CRESTOR) 40 MG tablet Take 1 tablet (40 mg total) by mouth daily. 04/01/22  Yes Gollan, Kathlene November, MD  sacubitril-valsartan (ENTRESTO) 24-26 MG Take 1 tablet by mouth 2 (two) times daily. 12/20/21  Yes Joyceann Kruser, Otila Kluver A, FNP  Semaglutide,0.25 or 0.'5MG'$ /DOS, (OZEMPIC, 0.25 OR 0.5 MG/DOSE,) 2 MG/1.5ML SOPN Inject 0.5 mg into the skin once a week.   Yes [provider]  spironolactone (ALDACTONE) 25 MG tablet Take 1 tablet (25 mg total) by mouth daily. 04/25/22  Yes Jaymason Ledesma, Otila Kluver A, FNP  torsemide (DEMADEX) 20 MG tablet Take 2 tablets (40 mg total) by mouth 2 (two) times daily. Patient taking differently: Take 40 mg by mouth daily. 01/15/22  Yes Madelline Eshbach, Otila Kluver A, FNP  HYDROcodone-acetaminophen (NORCO/VICODIN) 5-325 MG tablet Take 1 tablet by mouth every 8 (eight) hours as needed for severe pain. Patient not taking: Reported on 05/31/2022 05/21/22   Shirley Friar, PA-C  insulin lispro (HUMALOG) 100 UNIT/ML KwikPen Inject 3 units before breakfast and lunch. 5 units before dinner. 01/19/22 01/21/22  Swayze, Ava, DO   Review of Systems  Constitutional:   Positive for fatigue. Negative for appetite change.  HENT:  Negative for congestion, postnasal drip and sore throat.   Eyes:  Positive for visual disturbance (blurry vision).  Respiratory:  Positive for shortness of breath. Negative for cough and chest tightness.   Cardiovascular:  Positive for chest pain (intermittent). Negative for palpitations and leg swelling.  Gastrointestinal:  Negative for abdominal distention and abdominal pain.  Endocrine: Negative.   Genitourinary: Negative.   Musculoskeletal:  Negative for back pain and neck pain.  Skin: Negative.   Allergic/Immunologic: Negative.   Neurological:  Positive for dizziness. Negative for light-headedness.  Hematological:  Negative for adenopathy. Does not bruise/bleed easily.  Psychiatric/Behavioral:  Negative for dysphoric mood and sleep disturbance. The patient is not nervous/anxious.    Vitals:   06/07/22 0843  BP: (!) 154/87  Pulse: 78  Resp: 20  SpO2: 100%  Weight: 245 lb 2 oz (111.2 kg)  Height: 6' (1.829 m)   Wt Readings from Last 3 Encounters:  06/07/22 245 lb 2 oz (111.2 kg)  05/31/22 243 lb (110.2 kg)  05/23/22 243 lb 6 oz (110.4 kg)   Lab Results  Component Value Date   CREATININE 1.34 (H) 05/15/2022   CREATININE 1.38 (H) 05/14/2022   CREATININE 1.59 (H) 05/13/2022   Physical Exam Vitals and nursing note reviewed.  Constitutional:      Appearance: He is well-developed.  HENT:     Head: Normocephalic and atraumatic.  Cardiovascular:     Rate and Rhythm: Regular rhythm.  Pulmonary:     Effort: Pulmonary effort is normal. No accessory muscle usage.     Breath sounds: No wheezing, rhonchi or rales.  Abdominal:     General: There is no distension.     Palpations: Abdomen is soft.     Tenderness: There is no abdominal tenderness.     Comments: Swelling noted around ICD insertion site at left flank area  Musculoskeletal:     Right lower leg: No tenderness. No edema.     Left lower leg: No tenderness. No  edema.  Skin:    General: Skin is warm and dry.  Neurological:     General: No focal deficit present.     Mental Status: He is alert and oriented to person, place, and time.  Psychiatric:        Mood and Affect: Mood normal.        Behavior: Behavior normal.    Assessment & Plan:   1: Chronic heart failure with reduced ejection fraction- - NYHA class II - euvolemic today - weighing daily and reminded to call for an overnight weight gain of >2 pounds or a weekly weight gain of >5 pounds - weight up 2 pounds from last visit here 2 weeks ago  - not adding salt and he was encouraged to closely follow a '2000mg'$  sodium diet - saw cardiology Barbarann Ehlers) 02/14/22 - on GDMT of carvedilol, farxiga, spironolactone & entresto  - saw EP Quentin Ore) 04/17/22; ICD placed on 05/20/22 - saw provider at [facility] 07/06/20 but didn't return because he didn't want to keep driving to [place]; will have him see [facility] provider in [place] on 06/14/22 - if his home weight gets to 150 pounds, he can use furoscix but if he uses it, he's to not take torsemide that day and also call us.  - BNP from 12/04/21 was 243.0   2: HTN- - BP mildly elevated (154/87) - saw PCP Rollene Rotunda) 05/31/22 - BMP from 05/15/22 reviewed and showed sodium 142, potassium 4.1, creatinine 1.34 and GFR >60   3: Diabetes-   -  A1c was 7.5% on 05/14/22 - now has [place] monitor so can keep a  close on his glucose - saw nephrology Lanora Manis) 03/26/22; returns next week  4: COPD- - stopped smoking 2021 - had smoked for close to 40 years with starting at the age of 3 - at his peak, he was smoking 2-2.5 ppd - pulmonology referral placed    Patient did not bring his medications nor a list. Each medication was verbally reviewed with the patient and he was encouraged to bring the bottles to every visit to confirm accuracy of list.   Return in 2 months, sooner if needed.

## 2022-06-07 NOTE — Patient Instructions (Addendum)
Continue weighing daily and call for an overnight weight gain of 3 pounds or more or a weekly weight gain of more than 5 pounds.   If you have voicemail, please make sure your mailbox is cleaned out so that we may leave a message and please make sure to listen to any voicemails.    Do not use furoscix unless your weight gets to 150 pounds. If you use the furoscix, do not take the torsemide on that day.

## 2022-06-10 ENCOUNTER — Other Ambulatory Visit: Payer: Medicaid Other | Admitting: Pharmacist

## 2022-06-10 NOTE — Progress Notes (Signed)
06/10/2022 Name: Gabriel [name]. MRN: [id number] DOB: 1971-03-01  Chief Complaint  Patient presents with   Medication Management   Diabetes    Gabriel [name]. is a 51 y.o. year old male who presented for a telephone visit.   They were referred to the pharmacist by their PCP for assistance in managing diabetes.   Patient is participating in a Managed Medicaid Plan:  Yes  Subjective:  Care Team: Primary Care Provider: [name], FNP ; Next Scheduled Visit: 08/09/22  Medication Access/Adherence  Current Pharmacy:  [facility] [address]), [place] [address] [place] ([place]) [place] [address] Phone: [phone] Fax: [phone]   Patient reports affordability concerns with their medications: No  Patient reports access/transportation concerns to their pharmacy: No  Patient reports adherence concerns with their medications:  No     Diabetes:  Current medications:  Medications tried in the past:   Date of Download: 10/10-10/23/23 % Time CGM is active: 72% Average Glucose: 186 mg/dL Glucose Management Indicator: 7.8  Glucose Variability: 20 (goal <36%) Time in Goal:  - Time in range 70-180: 48% - Time above range: 52% - Time below range: 0%    Patient denies hypoglycemic s/sx including dizziness, shakiness, sweating. Patient denies hyperglycemic symptoms including polyuria, polydipsia, polyphagia, nocturia, neuropathy, blurred vision.  Current meal patterns:  - Breakfast: skips - Lunch: chicken patty on whole wheat tortilla; mayonnaise/mustard; pickles; occasionally some lightly salted chips but not frequently;  - Supper:  - Snacks: apple, carrots - Drinks: no sugar green tea; water    Health Maintenance  Health Maintenance Due  Topic Date Due   COVID-19 Vaccine (1) Never done   TETANUS/TDAP  Never done   Zoster Vaccines- Shingrix (1 of 2) Never done   FOOT EXAM  03/02/2021      Objective: Lab Results  Component Value Date   HGBA1C 7.5 (H) 05/14/2022    Lab Results  Component Value Date   CREATININE 1.34 (H) 05/15/2022   BUN 27 (H) 05/15/2022   NA 142 05/15/2022   K 4.1 05/15/2022   CL 111 05/15/2022   CO2 26 05/15/2022    Lab Results  Component Value Date   CHOL 110 11/25/2021   HDL 17 (L) 11/25/2021   LDLCALC 62 11/25/2021   LDLDIRECT 60.6 11/23/2020   TRIG 153 (H) 11/25/2021   CHOLHDL 6.5 11/25/2021    Medications Reviewed Today     Reviewed by Alisa Graff, FNP (Family Nurse Practitioner) on 06/07/22 at [place] List Status: <None>   Medication Order Taking? Sig Documenting Provider Last Dose Status Informant  carvedilol (COREG) 3.125 MG tablet [id number] Yes Take 1 tablet (3.125 mg total) by mouth 2 (two) times daily with a meal. Theora Gianotti, NP Taking Active   clobetasol (TEMOVATE) 0.05 % external solution [id number] Yes Apply 1 Application topically 2 (two) times daily. [name], FNP Taking Active   clobetasol ointment (TEMOVATE) 0.05 % [id number] Yes Apply 1 Application topically 2 (two) times daily. [name], FNP Taking Active   Continuous Blood Gluc Sensor (FREESTYLE LIBRE 3 SENSOR) [place] [id number] Yes Place 1 sensor on the skin every 14 days. Use to check glucose continuously Virginia Crews, MD Taking Active   dapagliflozin propanediol (FARXIGA) 10 MG TABS tablet [id number] Yes Take 1 tablet (10 mg total) by mouth daily. Alisa Graff, FNP Taking Active   fenofibrate (TRICOR) 145 MG tablet [id number] Yes  Take 1 tablet (145 mg total) by mouth daily. Alisa Graff, FNP Taking Active   FLUoxetine (PROZAC) 20 MG capsule [id number] Yes Take 1 capsule (20 mg total) by mouth daily. [name], FNP Taking Active   Furosemide (FUROSCIX) 80 MG/10ML CTKT [id number] Yes Inject 1 Dose into the skin. [provider] Taking Active   HYDROcodone-acetaminophen (NORCO/VICODIN) 5-325 MG tablet [id number] No Take  1 tablet by mouth every 8 (eight) hours as needed for severe pain.  Patient not taking: Reported on 05/31/2022   Shirley Friar, PA-C Not Taking Active            Med Note Tamsen Snider May 23, 2022  8:46 AM) Patient is out   insulin aspart (NOVOLOG) 100 UNIT/ML FlexPen [id number] Yes Inject 3 units into the skin before breakfast and lunch and 5 units before dinner. Swayze, Ava, DO Taking Active   Insulin Glargine (BASAGLAR KWIKPEN) 100 UNIT/ML [id number] Yes Inject 36 Units into the skin at bedtime. Swayze, Ava, DO Taking Active     Discontinued 01/21/22 1612 (Change in therapy)            Med Note Janan Ridge   Thu Feb 14, 2022  2:37 PM)    Insulin Pen Needle (PEN NEEDLES) 32G X 4 MM MISC [id number] Yes 200 each by Does not apply route at bedtime. [name], FNP Taking Active   Ipratropium-Albuterol (COMBIVENT RESPIMAT) 20-100 MCG/ACT AERS respimat [id number] Yes Inhale 1 puff into the lungs every 6 (six) hours as needed for wheezing. [provider] Taking Active Self  meclizine (ANTIVERT) 25 MG tablet [id number] Yes Take 1 tablet (25 mg total) by mouth 3 (three) times daily as needed for dizziness. Patrecia Pour, MD Taking Active   nitroGLYCERIN (NITROSTAT) 0.4 MG SL tablet [id number] Yes Place 1 tablet (0.4 mg total) under the tongue every 5 (five) minutes x 3 doses as needed for chest pain. Lorella Nimrod, MD Taking Active Self  potassium chloride SA (KLOR-CON M20) 20 MEQ tablet [id number] Yes Take 1 tablet daily  Patient taking differently: 20 mEq every other day.   Alisa Graff, FNP Taking Active   rivaroxaban (XARELTO) 20 MG TABS tablet [id number] Yes Take 1 tablet (20 mg total) by mouth daily with supper. Shirley Friar, PA-C Taking Active            Med Note Jodi Mourning, Tillie Fantasia May 30, 2022  1:36 PM)    rosuvastatin (CRESTOR) 40 MG tablet [id number] Yes Take 1 tablet (40 mg total) by mouth daily. Minna Merritts, MD Taking Active    sacubitril-valsartan [facility] 24-26 MG [id number] Yes Take 1 tablet by mouth 2 (two) times daily. Alisa Graff, FNP Taking Active   Semaglutide,0.25 or 0.'5MG'$ /DOS, (OZEMPIC, 0.25 OR 0.5 MG/DOSE,) 2 MG/1.5ML SOPN [id number] Yes Inject 0.5 mg into the skin once a week. [provider] Taking Active   spironolactone (ALDACTONE) 25 MG tablet [id number] Yes Take 1 tablet (25 mg total) by mouth daily. Alisa Graff, FNP Taking Active   torsemide (DEMADEX) 20 MG tablet [id number] Yes Take 2 tablets (40 mg total) by mouth 2 (two) times daily.  Patient taking differently: Take 40 mg by mouth daily.   Alisa Graff, FNP Taking Active            Med Note Tyrone Sage Feb 14, 2022  2:37 PM)  Assessment/Plan:   Diabetes: - Currently uncontrolled - Reviewed goal A1c, goal fasting, and goal 2 hour post prandial glucose - Patient with significant basal bolus mismatch. Additionally, goal to continue to increase Ozempic as able. Patient will complete 3rd week of Ozempic 0.5 mg this week. Recommend to increase Ozempic to 1 mg next week. Patient has a supply of 0.5 mg pen, recommend he takes 2 doses of 0.5 mg weekly. Recommend to increase lunch Novolog to 5 units with sliding scale of +1 per 50 mg/dL over 150, and increase supper dose to 8 units with sliding scale. Will discuss with PCP.  Follow Up Plan: phone call in 4 weeks  Catie TJodi Mourning, PharmD, [facility] [phone]

## 2022-06-12 DIAGNOSIS — R809 Proteinuria, unspecified: Secondary | ICD-10-CM | POA: Diagnosis not present

## 2022-06-12 DIAGNOSIS — E1122 Type 2 diabetes mellitus with diabetic chronic kidney disease: Secondary | ICD-10-CM | POA: Diagnosis not present

## 2022-06-12 DIAGNOSIS — E668 Other obesity: Secondary | ICD-10-CM | POA: Diagnosis not present

## 2022-06-12 DIAGNOSIS — E785 Hyperlipidemia, unspecified: Secondary | ICD-10-CM | POA: Diagnosis not present

## 2022-06-12 DIAGNOSIS — N1832 Chronic kidney disease, stage 3b: Secondary | ICD-10-CM | POA: Diagnosis not present

## 2022-06-12 DIAGNOSIS — J449 Chronic obstructive pulmonary disease, unspecified: Secondary | ICD-10-CM | POA: Diagnosis not present

## 2022-06-12 DIAGNOSIS — I509 Heart failure, unspecified: Secondary | ICD-10-CM | POA: Diagnosis not present

## 2022-06-12 DIAGNOSIS — L409 Psoriasis, unspecified: Secondary | ICD-10-CM | POA: Diagnosis not present

## 2022-06-12 DIAGNOSIS — Z4502 Encounter for adjustment and management of automatic implantable cardiac defibrillator: Secondary | ICD-10-CM | POA: Diagnosis not present

## 2022-06-12 DIAGNOSIS — N179 Acute kidney failure, unspecified: Secondary | ICD-10-CM | POA: Diagnosis not present

## 2022-06-12 DIAGNOSIS — I2510 Atherosclerotic heart disease of native coronary artery without angina pectoris: Secondary | ICD-10-CM | POA: Diagnosis not present

## 2022-06-12 DIAGNOSIS — I10 Essential (primary) hypertension: Secondary | ICD-10-CM | POA: Diagnosis not present

## 2022-06-13 ENCOUNTER — Other Ambulatory Visit: Payer: Self-pay | Admitting: Pharmacist

## 2022-06-13 DIAGNOSIS — E1169 Type 2 diabetes mellitus with other specified complication: Secondary | ICD-10-CM

## 2022-06-13 MED ORDER — INSULIN ASPART 100 UNIT/ML FLEXPEN: PEN_INJECTOR | SUBCUTANEOUS | 3 refills | Status: DC

## 2022-06-13 MED ORDER — SEMAGLUTIDE (1 MG/DOSE) 4 MG/3ML ~~LOC~~ SOPN: 1.0000 mg | PEN_INJECTOR | SUBCUTANEOUS | 3 refills | Status: DC

## 2022-06-13 NOTE — Progress Notes (Signed)
Care Coordination Call  Called patient. Discussed regimen changes per Tally Joe. Complete 3rd week of Ozempic 0.5 mg weekly today, then increase to 1 mg weekly next week. Increase Novolog to 5 units + 1 unit per 50 mg/dL above 150 before lunch and 8 units +1 unit per 50 mg/dL above 150. Patient verbalized understanding.   Catie Hedwig Morton, PharmD, [facility] [phone]

## 2022-06-14 ENCOUNTER — Encounter: Payer: Self-pay | Admitting: Internal Medicine

## 2022-06-14 ENCOUNTER — Telehealth (HOSPITAL_COMMUNITY): Payer: Self-pay | Admitting: Pharmacy Technician

## 2022-06-14 ENCOUNTER — Other Ambulatory Visit
Admission: RE | Admit: 2022-06-14 | Discharge: 2022-06-14 | Disposition: A | Discharge status: Home / Self Care | Payer: Medicaid Other | Source: Ambulatory Visit | Attending: Internal Medicine | Admitting: Internal Medicine

## 2022-06-14 ENCOUNTER — Ambulatory Visit (HOSPITAL_BASED_OUTPATIENT_CLINIC_OR_DEPARTMENT_OTHER): Payer: Medicaid Other | Admitting: Internal Medicine

## 2022-06-14 ENCOUNTER — Encounter (HOSPITAL_COMMUNITY): Payer: Self-pay | Admitting: *Deleted

## 2022-06-14 ENCOUNTER — Other Ambulatory Visit (HOSPITAL_COMMUNITY): Payer: Self-pay

## 2022-06-14 VITALS — BP 157/84 | HR 79 | Resp 20 | Ht 72.0 in | Wt 246.2 lb

## 2022-06-14 DIAGNOSIS — I5022 Chronic systolic (congestive) heart failure: Secondary | ICD-10-CM | POA: Diagnosis not present

## 2022-06-14 DIAGNOSIS — I2510 Atherosclerotic heart disease of native coronary artery without angina pectoris: Secondary | ICD-10-CM | POA: Diagnosis not present

## 2022-06-14 DIAGNOSIS — I10 Essential (primary) hypertension: Secondary | ICD-10-CM | POA: Diagnosis not present

## 2022-06-14 DIAGNOSIS — G4733 Obstructive sleep apnea (adult) (pediatric): Secondary | ICD-10-CM

## 2022-06-14 LAB — BASIC METABOLIC PANEL
Anion gap: 9 (ref 5–15)
BUN: 21 mg/dL — ABNORMAL HIGH (ref 6–20)
CO2: 23 mmol/L (ref 22–32)
Calcium: 9.8 mg/dL (ref 8.9–10.3)
Chloride: 108 mmol/L (ref 98–111)
Creatinine, Ser: 1.25 mg/dL — ABNORMAL HIGH (ref 0.61–1.24)
GFR, Estimated: >60 mL/min (ref >60)
Glucose, Bld: 170 mg/dL — ABNORMAL HIGH (ref 70–99)
Potassium: 4.1 mmol/L (ref 3.5–5.1)
Sodium: 140 mmol/L (ref 135–145)

## 2022-06-14 LAB — BRAIN NATRIURETIC PEPTIDE: B Natriuretic Peptide: 149.2 pg/mL — ABNORMAL HIGH (ref 0.0–100.0)

## 2022-06-14 MED ORDER — SACUBITRIL-VALSARTAN 49-51 MG PO TABS: 1.0000 | ORAL_TABLET | Freq: Two times a day (BID) | ORAL | 3 refills | Status: DC

## 2022-06-14 NOTE — Progress Notes (Signed)
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  Referred by [name] for further management of his HF.  In October 2021, he was admitted with chest pain and elevated troponin above her baseline. Cath revealed a chronic total occlusion of the RCA with left-to-right collaterals.  He otherwise had moderate, nonobstructive disease including a 50% distal stenosis in the LAD, 30% proximal stenosis in the left circumflex, and a 40% OM1 lesion.  He was medically managed. Follow-up echo in October 2022, in the setting of recurrent hospitalization for HF, showed EF 40 to 45%.    He was readmitted in 2/23 and 4/23 with HF.  He responded well to IV diuresis and at his request, he was discharged home on April 12 torsemide 40 mg twice daily (previous outpatient dose of 60 mg daily).  Creatinine was trending up prior to discharge and follow-up creatinine today later was higher at 2.08.    He presented to the ED 6/1 with blood glucose elevated @ 534.  Subsequent labs found troponins elevated from 56  51  52  51.  This is consistent with his chronically elevated troponins since 06/2019.  Other notable labs: K 2.7, Creat up from baseline @ 2.42.  He was treated w/ insulin, IVF, and KCl.    Last echo 02/06/22: EF 30-35%   Labs 05/15/22 SCr 1.3   Underwent SQ ICD placement 05/20/22  Lives with hs dad. Quit smoking 2 years ago. DM2 getting under better control A1c now 7.5. Can do go to grocery store but has to go slow and it wears him out. Fluid has been up and down. Now feels like it is better. Taking torsemide  40 daily. Takes extra if weight hits 250 or more.Has Furoscix to use as needed. No orthopnea or PND. Occasional sharp CP. Compliant with meds   Review of Systems: [y] = yes, '[ ]'$  = no   General: Weight gain '[ ]'$ ; Weight loss '[ ]'$ ; Anorexia '[ ]'$ ; Fatigue [ y]; Fever '[ ]'$ ; Chills '[ ]'$ ; Weakness '[ ]'$   Cardiac: Chest pain/pressure [ y]; Resting SOB '[ ]'$ ; Exertional SOB [ y]; Orthopnea '[ ]'$ ; Pedal Edema '[ ]'$ ; Palpitations '[ ]'$ ; Syncope '[ ]'$ ; Presyncope '[ ]'$ ; Paroxysmal nocturnal dyspnea'[ ]'$   Pulmonary: Cough '[ ]'$ ; Wheezing'[ ]'$ ; Hemoptysis'[ ]'$ ; Sputum '[ ]'$ ; Snoring [name] ]  GI: Vomiting'[ ]'$ ; Dysphagia'[ ]'$ ; Melena'[ ]'$ ; Hematochezia '[ ]'$ ; Heartburn'[ ]'$ ; Abdominal pain '[ ]'$ ; Constipation '[ ]'$ ; Diarrhea '[ ]'$ ; BRBPR '[ ]'$   GU: Hematuria'[ ]'$ ; Dysuria '[ ]'$ ; Nocturia'[ ]'$   Vascular: Pain in legs with walking '[ ]'$ ; Pain in feet with lying flat '[ ]'$ ; Non-healing sores '[ ]'$ ; Stroke '[ ]'$ ; TIA '[ ]'$ ; Slurred speech '[ ]'$ ;  Neuro: Headaches'[ ]'$ ; Vertigo'[ ]'$ ; Seizures'[ ]'$ ; Paresthesias'[ ]'$ ;Blurred vision '[ ]'$ ; Diplopia '[ ]'$ ; Vision changes '[ ]'$   Ortho/Skin: Arthritis [ y]; Joint pain [ y]; Muscle pain [ y]; Joint swelling [ y]; Back Pain [ y]; Rash '[ ]'$   Psych: Depression'[ ]'$ ; Anxiety'[ ]'$   Heme: Bleeding problems '[ ]'$ ; Clotting disorders '[ ]'$ ; Anemia '[ ]'$   Endocrine: Diabetes [name] ]; Thyroid dysfunction'[ ]'$   Past Medical History:  Diagnosis Date   CAD (coronary artery disease)    a. 04/2015 low risk MV;  b. 12/2016 Cath: minor irregs in LAD/Diag/LCX/OM, RCA 40p/m/d; c. 05/2020 Cath: LM nl, LAD 50d, LCX 30p, OM1 40, RCA 100p w/ L->R collats. CO/CI 3.1/1.3-->Med rx.   Chest wall pain, chronic    Chronic Troponin Elevation    CKD (chronic kidney disease), stage II-III    COPD (chronic obstructive pulmonary disease) (HCC)    Diabetes mellitus without complication ([place])    HFrEF (heart failure with reduced ejection fraction) ([place])    a. 03/2015 Echo: EF 45-50%; b. 12/2015 Echo: EF 20-25%; c. 02/2016 Echo: EF 30-35%; d. 11/2016 Echo: EF 40-45%; e. 06/2019 Echo: EF 30-35%; f.  11/2019 Echo: EF 25-30%; g. 05/2020 Echo: EF 35-40%; h. 05/2021 Echo: EF 40-45%; i. 11/2021 Echo: EF 20-25%, glob HK, mild LVH, GrIII DD, sev red RV fxn, mild MR.   Hypertension    Mitral regurgitation    Mild to moderate by October 2021 echocardiogram.   Mixed Ischemic & NICM (nonischemic cardiomyopathy) ([place])    a. 03/2015 Echo: EF 45-50%; b. 12/2015 Echo: EF 20-25%; c. 02/2016 Echo: EF 30-35%; d. 11/2016 Echo: EF 40-45%; e. 06/2019 Echo: EF 30-35%; f. 11/2019 Echo: EF 25-30%; g. 05/2020 Echo: EF 35-40%; h. 05/2021 Echo: EF 40-45%; i. 11/2021 Echo: EF 20-25%   Myocardial infarct (HCC)    NSVT (nonsustained ventricular tachycardia) ([place])    a. 12/2015 noted on tele-->amio;  b. 12/2015 Event monitor: no VT noted.   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  12/31/19: RUL and RLL PEs.   Syncope    a. 01/2016 - felt to be vasovagal.    Current Outpatient Medications  Medication Sig Dispense Refill   carvedilol (COREG) 3.125 MG tablet Take 1 tablet (3.125 mg total) by mouth 2 (two) times daily with a meal. 60 tablet 3   clobetasol (TEMOVATE) 0.05 % external solution Apply 1 Application topically 2 (two) times daily. 50 mL 1   clobetasol ointment (TEMOVATE) 9.93 % Apply 1 Application topically 2 (two) times daily. 60 g 1   Continuous Blood Gluc Sensor (FREESTYLE LIBRE 3 SENSOR) MISC Place 1 sensor on the skin every 14 days. Use to check glucose continuously 6 each 3   dapagliflozin propanediol (FARXIGA) 10 MG TABS tablet Take 1 tablet (10 mg total) by mouth daily. 30 tablet 5   fenofibrate (TRICOR) 145 MG tablet Take 1 tablet (145 mg total) by mouth daily. 90 tablet 3   FLUoxetine (PROZAC) 20 MG capsule Take 1 capsule (20 mg total) by mouth daily. 90 capsule 3   Furosemide (FUROSCIX) 80 MG/10ML CTKT Inject 1 Dose into the skin.     HYDROcodone-acetaminophen (NORCO/VICODIN) 5-325 MG tablet Take 1 tablet by mouth every 8 (eight)  hours as needed for severe pain. 8 tablet 0   insulin aspart (NOVOLOG) 100 UNIT/ML FlexPen Inject 5 units + correction before breakfast/lunch and 8 units + correction before supper (correction = 1 unit per 50 mg/dL above 150); max daily dose 30 units 15 mL 3   Insulin Glargine (BASAGLAR KWIKPEN) 100 UNIT/ML Inject 36 Units into the skin at bedtime. 30 mL 3   Insulin Pen Needle (PEN NEEDLES) 32G X 4 MM MISC 200 each by Does not apply route at bedtime. 200 each 3   Ipratropium-Albuterol (COMBIVENT RESPIMAT) 20-100 MCG/ACT AERS respimat Inhale 1 puff into  the lungs every 6 (six) hours as needed for wheezing.     meclizine (ANTIVERT) 25 MG tablet Take 1 tablet (25 mg total) by mouth 3 (three) times daily as needed for dizziness. 15 tablet 0   nitroGLYCERIN (NITROSTAT) 0.4 MG SL tablet Place 1 tablet (0.4 mg total) under the tongue every 5 (five) minutes x 3 doses as needed for chest pain. 30 tablet 0   potassium chloride SA (KLOR-CON M) 20 MEQ tablet Take 20 mEq by mouth every other day.     rivaroxaban (XARELTO) 20 MG TABS tablet Take 1 tablet (20 mg total) by mouth daily with supper. 90 tablet 3   rosuvastatin (CRESTOR) 40 MG tablet Take 1 tablet (40 mg total) by mouth daily. 90 tablet 3   sacubitril-valsartan (ENTRESTO) 24-26 MG Take 1 tablet by mouth 2 (two) times daily. 180 tablet 3   Semaglutide, 1 MG/DOSE, 4 MG/3ML SOPN Inject 1 mg as directed once a week. 3 mL 3   spironolactone (ALDACTONE) 25 MG tablet Take 1 tablet (25 mg total) by mouth daily. 90 tablet 3   torsemide (DEMADEX) 20 MG tablet Take 40 mg by mouth daily.     No current facility-administered medications for this visit.    Allergies  Allergen Reactions   Metformin And Related Nausea And Vomiting   Prednisone Other (See Comments)    Reaction: Hallucinations     Zantac [Ranitidine Hcl] Diarrhea and Nausea Only    Night sweats      Social History   Socioeconomic History   Marital status: Widowed    Spouse name: Not on  file   Number of children: 1   Years of education: 79   Highest education level: 12th grade  Occupational History   Occupation: disabled  Tobacco Use   Smoking status: Former    Packs/day: 0.50    Years: 33.00    Total pack years: 16.50    Types: Cigarettes    Quit date: 05/08/2020    Years since quitting: 2.1   Smokeless tobacco: Former    Quit date: 05/08/2020   Tobacco comments:    Quit Sept 2021  Vaping Use   Vaping Use: Former  Substance and Sexual Activity   Alcohol use: Not Currently    Comment: occassionally   Drug use: No   Sexual activity: Not Currently  Other Topics Concern   Not on file  Social History Narrative   Lives at home with wife and his dad's wife.        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    Social Determinants of Health   Financial Resource Strain: High Risk (04/23/2022)   Overall Financial Resource Strain (CARDIA)    Difficulty of Paying Living Expenses: Hard  Food Insecurity: Food Insecurity Present (05/14/2022)   Hunger Vital Sign    Worried About Running Out of Food in the Last Year: Often true    Ran Out of Food in the Last Year: Sometimes true  Transportation Needs: No Transportation Needs (05/14/2022)   PRAPARE - Hydrologist (Medical): No    Lack of Transportation (Non-Medical): No  Physical Activity: Insufficiently Active (01/20/2018)   Exercise Vital Sign    Days of Exercise per Week: 7 days    Minutes of Exercise per Session: 10 min  Stress: Stress Concern Present (09/02/2017)   [place]  Feeling of Stress : Very much  Social Connections: Moderately Isolated (09/02/2017)   Social Connection and Isolation Panel [NHANES]    Frequency of Communication with Friends and Family: Twice a week    Frequency of Social Gatherings with Friends and Family: Once a week    Attends  Religious Services: Never    Marine scientist or Organizations: No    Attends Archivist Meetings: Never    Marital Status: Widowed  Intimate Partner Violence: Not At Risk (05/14/2022)   Humiliation, Afraid, Rape, and Kick questionnaire    Fear of Current or Ex-Partner: No    Emotionally Abused: No    Physically Abused: No    Sexually Abused: No      Family History  Problem Relation Age of Onset   Diabetes Mother    Diabetes Mellitus II Mother    Hypothyroidism Mother    Hypertension Mother    Kidney failure Mother        Dialysis   Heart attack Mother        71 yo approximately   Hypertension Father    Gout Father    Cancer Maternal Grandfather    Diabetes Maternal Grandfather    Cancer Paternal Aunt     Vitals:   06/14/22 0942  BP: (!) 157/84  Pulse: 79  Resp: 20  SpO2: 98%  Weight: 246 lb 4 oz (111.7 kg)  Height: 6' (1.829 m)    PHYSICAL EXAM: General:  Well appearing. No respiratory difficulty HEENT: normal Neck: supple. no JVD. Carotids 2+ bilat; no bruits. No lymphadenopathy or thryomegaly appreciated. Cor: PMI nondisplaced. Regular rate & rhythm. No rubs, gallops or murmurs. Lungs: clear Abdomen: obese soft, nontender, nondistended. No hepatosplenomegaly. No bruits or masses. Good bowel sounds. Extremities: no cyanosis, clubbing, rash, edema Neuro: alert & oriented x 3, cranial nerves grossly intact. moves all 4 extremities w/o difficulty. Affect pleasant.  ECG: NSR 81 No ST-T wave abnormalities. Personally reviewed   ASSESSMENT & PLAN:   1.  Chronic HFrEF/Mixed Ischemic and nonischemic cardiomyopathy:   - Mixed ICM/NICM - 10/22 where EF was 40-45%.  - Last echo 02/06/22: EF 30-35%  - NYHA III - s/p [place] Sci ICD  - Volume status mildly elevated. Continue torsemide. Can take extra as needed - Increase Entresto to 49/51 bid - Continue Farxiga 10 - Continue spiro 25 daily - Continue carvedilol 3.125 bid - Consider Cardiomems to  help volume management as needed - Refer to CR  2.  CAD/Elevated Troponin and chronic CP:  - H/o CAD w/ last cath in 10/201 revealing an occluded RCA w/ otw nonobs dzs and L  R collats.  - Continue ASA/statin   3.  HTN:  - BP high today but hasn't hd meds yet.   - Increase Entresto   4.  H/o PE/Chronic anticoagulation:  -On Xarelto.    5.  CKD IIIa  - check labs today - continue SGLT2i/Entresto   6. DM2 -  per PCP  7. OSA - not on CPAP due to lack of insurance. Now on Medicaid - Sleep study 3/22: AHI 28  - will repeat sleep study  Glori Bickers, MD  10:17 AM

## 2022-06-14 NOTE — Patient Instructions (Signed)
Medication Changes:  Increase Entresto to 49/51 mg Twice daily   Lab Work:  Labs done today, your results will be available in MyChart, we will contact you for abnormal readings.  Testing/Procedures:  Your physician has recommended that you have a sleep study. This test records several body functions during sleep, including: brain activity, eye movement, oxygen and carbon dioxide blood levels, heart rate and rhythm, breathing rate and rhythm, the flow of air through your mouth and nose, snoring, body muscle movements, and chest and belly movement. ONCE APPROVED BY INSURANCE YOU WILL BE CONTACTED TO SCHEDULE  Referrals:  You have been referred to Cardiac Rehab, they will call you to schedule  Special Instructions // Education:  Do the following things EVERYDAY: Weigh yourself in the morning before breakfast. Write it down and keep it in a log. Take your medicines as prescribed Eat low salt foods--Limit salt (sodium) to 2000 mg per day.  Stay as active as you can everyday Limit all fluids for the day to less than 2 liters   Follow-Up in: 6 weeks  At the [facility], you and your health needs are our priority. We have a designated team specialized in the treatment of Heart Failure. This Care Team includes your primary Heart Failure Specialized Cardiologist (physician), Advanced Practice Providers (APPs- Physician Assistants and Nurse Practitioners), and Pharmacist who all work together to provide you with the care you need, when you need it.   You may see any of the following providers on your designated Care Team at your next follow up:  Dr. Glori Bickers Dr. Loralie Champagne Dr. Roxana Hires, NP Lyda Jester, [place] [facility] [place], [place] Forestine Na, NP Audry Riles, PharmD   Please be sure to bring in all your medications bottles to every appointment.   Need to Contact us:  If you have any questions or concerns before your  next appointment please send [place] a message through [place] or call our office at [phone].    TO LEAVE A MESSAGE FOR THE NURSE SELECT OPTION 2, PLEASE LEAVE A MESSAGE INCLUDING: YOUR NAME DATE OF BIRTH CALL BACK NUMBER REASON FOR CALL**this is important as we prioritize the call backs  YOU WILL RECEIVE A CALL BACK THE SAME DAY AS LONG AS YOU CALL BEFORE 4:00 PM

## 2022-06-14 NOTE — Progress Notes (Signed)
Currently Medicaid will not cover Cardiomems device/implant

## 2022-06-14 NOTE — Telephone Encounter (Signed)
Patient Advocate Encounter   Received notification from [place] that prior authorization for Gabriel [name] is required.   PA submitted on CoverMyMeds Key [id number] Status is pending   Will continue to follow.

## 2022-06-18 NOTE — Telephone Encounter (Signed)
Advanced Heart Failure Patient Advocate [employer] wanted more information that was not on the original PA. Resent PA with updated information and chart notes.   Will follow up.

## 2022-06-18 NOTE — Telephone Encounter (Signed)
Advanced Heart Failure Patient Advocate Encounter  Prior Authorization for Gabriel [name] has been approved.    Effective dates: 06/18/22 through 06/19/23  [name], CPhT

## 2022-06-20 ENCOUNTER — Other Ambulatory Visit: Payer: Self-pay

## 2022-06-20 ENCOUNTER — Encounter: Payer: Medicaid Other | Attending: Internal Medicine

## 2022-06-20 DIAGNOSIS — I5022 Chronic systolic (congestive) heart failure: Secondary | ICD-10-CM | POA: Insufficient documentation

## 2022-06-20 NOTE — Progress Notes (Signed)
Virtual Visit completed. Patient informed on EP and RD appointment and 6 Minute walk test. Patient also informed of patient health questionnaires on My Chart. Patient Verbalizes understanding. Visit diagnosis can be found in [facility] 06/14/2022.

## 2022-06-25 ENCOUNTER — Other Ambulatory Visit: Payer: Medicaid Other | Admitting: Pharmacist

## 2022-07-01 ENCOUNTER — Encounter: Payer: Medicaid Other | Admitting: *Deleted

## 2022-07-01 VITALS — Ht 72.0 in | Wt 244.2 lb

## 2022-07-01 DIAGNOSIS — I5040 Unspecified combined systolic (congestive) and diastolic (congestive) heart failure: Secondary | ICD-10-CM

## 2022-07-01 DIAGNOSIS — I5022 Chronic systolic (congestive) heart failure: Secondary | ICD-10-CM

## 2022-07-01 DIAGNOSIS — I5042 Chronic combined systolic (congestive) and diastolic (congestive) heart failure: Secondary | ICD-10-CM

## 2022-07-01 NOTE — Progress Notes (Signed)
Cardiac Individual Treatment Plan  Patient Details  Name: Gabriel [name]. MRN: [id number] Date of Birth: 1971/03/29 Referring Provider:   Flowsheet Row Cardiac Rehab from 07/01/2022 in [facility] Cardiac and Pulmonary Rehab  Referring Provider Bensimhon       Initial Encounter Date:  Flowsheet Row Cardiac Rehab from 07/01/2022 in [facility] Cardiac and Pulmonary Rehab  Date 07/01/22       Visit Diagnosis: Heart failure, chronic systolic (HCC)  Chronic combined systolic and diastolic congestive heart failure (HCC)  Combined systolic and diastolic heart failure, unspecified HF chronicity (HCC)  Patient's Home Medications on Admission:  Current Outpatient Medications:    carvedilol (COREG) 3.125 MG tablet, Take 1 tablet (3.125 mg total) by mouth 2 (two) times daily with a meal., Disp: 60 tablet, Rfl: 3   clobetasol (TEMOVATE) 0.05 % external solution, Apply 1 Application topically 2 (two) times daily., Disp: 50 mL, Rfl: 1   clobetasol ointment (TEMOVATE) 8.93 %, Apply 1 Application topically 2 (two) times daily. (Patient not taking: Reported on 06/20/2022), Disp: 60 g, Rfl: 1   Continuous Blood Gluc Sensor (FREESTYLE LIBRE 3 SENSOR) MISC, Place 1 sensor on the skin every 14 days. Use to check glucose continuously, Disp: 6 each, Rfl: 3   dapagliflozin propanediol (FARXIGA) 10 MG TABS tablet, Take 1 tablet (10 mg total) by mouth daily., Disp: 30 tablet, Rfl: 5   fenofibrate (TRICOR) 145 MG tablet, Take 1 tablet (145 mg total) by mouth daily., Disp: 90 tablet, Rfl: 3   FLUoxetine (PROZAC) 20 MG capsule, Take 1 capsule (20 mg total) by mouth daily., Disp: 90 capsule, Rfl: 3   Furosemide (FUROSCIX) 80 MG/10ML CTKT, Inject 1 Dose into the skin., Disp: , Rfl:    HYDROcodone-acetaminophen (NORCO/VICODIN) 5-325 MG tablet, Take 1 tablet by mouth every 8 (eight) hours as needed for severe pain. (Patient not taking: Reported on 06/20/2022), Disp: 8 tablet, Rfl: 0   insulin aspart (NOVOLOG) 100 UNIT/ML  FlexPen, Inject 5 units + correction before breakfast/lunch and 8 units + correction before supper (correction = 1 unit per 50 mg/dL above 150); max daily dose 30 units, Disp: 15 mL, Rfl: 3   Insulin Glargine (BASAGLAR KWIKPEN) 100 UNIT/ML, Inject 36 Units into the skin at bedtime., Disp: 30 mL, Rfl: 3   Insulin Pen Needle (PEN NEEDLES) 32G X 4 MM MISC, 200 each by Does not apply route at bedtime., Disp: 200 each, Rfl: 3   Ipratropium-Albuterol (COMBIVENT RESPIMAT) 20-100 MCG/ACT AERS respimat, Inhale 1 puff into the lungs every 6 (six) hours as needed for wheezing., Disp: , Rfl:    meclizine (ANTIVERT) 25 MG tablet, Take 1 tablet (25 mg total) by mouth 3 (three) times daily as needed for dizziness., Disp: 15 tablet, Rfl: 0   nitroGLYCERIN (NITROSTAT) 0.4 MG SL tablet, Place 1 tablet (0.4 mg total) under the tongue every 5 (five) minutes x 3 doses as needed for chest pain., Disp: 30 tablet, Rfl: 0   potassium chloride SA (KLOR-CON M) 20 MEQ tablet, Take 20 mEq by mouth every other day., Disp: , Rfl:    rivaroxaban (XARELTO) 20 MG TABS tablet, Take 1 tablet (20 mg total) by mouth daily with supper., Disp: 90 tablet, Rfl: 3   rosuvastatin (CRESTOR) 40 MG tablet, Take 1 tablet (40 mg total) by mouth daily., Disp: 90 tablet, Rfl: 3   sacubitril-valsartan (ENTRESTO) 49-51 MG, Take 1 tablet by mouth 2 (two) times daily., Disp: 60 tablet, Rfl: 3   Semaglutide, 1 MG/DOSE, 4 MG/3ML SOPN,  Inject 1 mg as directed once a week., Disp: 3 mL, Rfl: 3   Semaglutide,0.25 or 0.'5MG'$ /DOS, (OZEMPIC, 0.25 OR 0.5 MG/DOSE,) 2 MG/1.5ML SOPN, Inject into the skin. (Patient not taking: Reported on 06/20/2022), Disp: , Rfl:    spironolactone (ALDACTONE) 25 MG tablet, Take 1 tablet (25 mg total) by mouth daily., Disp: 90 tablet, Rfl: 3   torsemide (DEMADEX) 20 MG tablet, Take 40 mg by mouth daily., Disp: , Rfl:   Past Medical History: Past Medical History:  Diagnosis Date   CAD (coronary artery disease)    a. 04/2015 low risk MV;   b. 12/2016 Cath: minor irregs in LAD/Diag/LCX/OM, RCA 40p/m/d; c. 05/2020 Cath: LM nl, LAD 50d, LCX 30p, OM1 40, RCA 100p w/ L->R collats. CO/CI 3.1/1.3-->Med rx.   Chest wall pain, chronic    Chronic Troponin Elevation    CKD (chronic kidney disease), stage II-III    COPD (chronic obstructive pulmonary disease) (HCC)    Diabetes mellitus without complication ([place])    HFrEF (heart failure with reduced ejection fraction) ([place])    a. 03/2015 Echo: EF 45-50%; b. 12/2015 Echo: EF 20-25%; c. 02/2016 Echo: EF 30-35%; d. 11/2016 Echo: EF 40-45%; e. 06/2019 Echo: EF 30-35%; f. 11/2019 Echo: EF 25-30%; g. 05/2020 Echo: EF 35-40%; h. 05/2021 Echo: EF 40-45%; i. 11/2021 Echo: EF 20-25%, glob HK, mild LVH, GrIII DD, sev red RV fxn, mild MR.   Hypertension    Mitral regurgitation    Mild to moderate by October 2021 echocardiogram.   Mixed Ischemic & NICM (nonischemic cardiomyopathy) ([place])    a. 03/2015 Echo: EF 45-50%; b. 12/2015 Echo: EF 20-25%; c. 02/2016 Echo: EF 30-35%; d. 11/2016 Echo: EF 40-45%; e. 06/2019 Echo: EF 30-35%; f. 11/2019 Echo: EF 25-30%; g. 05/2020 Echo: EF 35-40%; h. 05/2021 Echo: EF 40-45%; i. 11/2021 Echo: EF 20-25%   Myocardial infarct (HCC)    NSVT (nonsustained ventricular tachycardia) ([place])    a. 12/2015 noted on tele-->amio;  b. 12/2015 Event monitor: no VT noted.   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  12/31/19: RUL and RLL PEs.   Syncope    a. 01/2016 - felt to be vasovagal.    Tobacco Use: Social History   Tobacco Use  Smoking Status Former   Packs/day: 0.50   Years: 33.00   Total pack years: 16.50   Types: Cigarettes   Quit date: 05/08/2020   Years since quitting: 2.1  Smokeless Tobacco Former   Quit date: 05/08/2020  Tobacco Comments   Quit Sept 2021    Labs: Review Flowsheet  More data exists      Latest Ref Rng & Units 11/24/2021 11/25/2021 02/08/2022 05/09/2022 05/14/2022  Labs for ITP Cardiac  and Pulmonary Rehab  Cholestrol 0 - 200 mg/dL - 110  - - -  LDL (calc) 0 - 99 mg/dL - 62  - - -  HDL-C >40 mg/dL - 17  - - -  Trlycerides <150 mg/dL - 153  - - -  Hemoglobin A1c 4.8 - 5.6 % 8.7  - 9.8  7.6  7.5      Exercise Target Goals: Exercise Program Goal: Individual exercise prescription set using results from initial 6 min walk test and THRR while considering  patient's activity barriers and safety.   Exercise Prescription Goal: Initial exercise prescription builds to 30-45 minutes a day of aerobic activity, 2-3 days per week.  Home exercise guidelines  will be given to patient during program as part of exercise prescription that the participant will acknowledge.   Education: Aerobic Exercise: - Group verbal and visual presentation on the components of exercise prescription. Introduces F.I.T.T principle from ACSM for exercise prescriptions.  Reviews F.I.T.T. principles of aerobic exercise including progression. Written material given at graduation. Flowsheet Row Cardiac Rehab from 07/01/2022 in [facility] Cardiac and Pulmonary Rehab  Education need identified 07/01/22       Education: Resistance Exercise: - Group verbal and visual presentation on the components of exercise prescription. Introduces F.I.T.T principle from ACSM for exercise prescriptions  Reviews F.I.T.T. principles of resistance exercise including progression. Written material given at graduation.    Education: Exercise & Equipment Safety: - Individual verbal instruction and demonstration of equipment use and safety with use of the equipment. Flowsheet Row Cardiac Rehab from 07/01/2022 in [facility] Cardiac and Pulmonary Rehab  Date 07/01/22  Educator [facility]  Instruction Review Code 1- Verbalizes Understanding       Education: Exercise Physiology & General Exercise Guidelines: - Group verbal and written instruction with models to review the exercise physiology of the cardiovascular system and associated critical values.  Provides general exercise guidelines with specific guidelines to those with heart or lung disease.    Education: Flexibility, Balance, Mind/Body Relaxation: - Group verbal and visual presentation with interactive activity on the components of exercise prescription. Introduces F.I.T.T principle from ACSM for exercise prescriptions. Reviews F.I.T.T. principles of flexibility and balance exercise training including progression. Also discusses the mind body connection.  Reviews various relaxation techniques to help reduce and manage stress (i.e. Deep breathing, progressive muscle relaxation, and visualization). Balance handout provided to take home. Written material given at graduation.   Activity Barriers & Risk Stratification:   6 Minute Walk:  6 Minute Walk     Row Name 07/01/22 1600         6 Minute Walk   Phase Initial     Distance 700 feet     Walk Time 5.75 minutes     # of Rest Breaks 1     MPH 1.38     METS 2.28     RPE 19     Perceived Dyspnea  4     VO2 Peak 7.97     Symptoms No     Resting HR 75 bpm     Resting BP 148/80     Resting Oxygen Saturation  96 %     Exercise Oxygen Saturation  during 6 min walk 96 %     Max Ex. HR 104 bpm     Max Ex. BP 138/80     2 Minute Post BP 124/80              Oxygen Initial Assessment:   Oxygen Re-Evaluation:   Oxygen Discharge (Final Oxygen Re-Evaluation):   Initial Exercise Prescription:  Initial Exercise Prescription - 07/01/22 1600       Date of Initial Exercise RX and Referring Provider   Date 07/01/22    Referring Provider Bensimhon      Oxygen   Maintain Oxygen Saturation 88% or higher      Treadmill   MPH 1    Grade 0    Minutes 15    METs 1.8      NuStep   Level 1    SPM 80    Minutes 15    METs 2.28      T5 Nustep   Level 1  SPM 80    Minutes 15    METs 2.28      Biostep-RELP   Level 1    SPM 50    Minutes 15    METs 2.28      Track   Laps 7    Minutes 15    METs 1.38       Prescription Details   Frequency (times per week) 2    Duration Progress to 30 minutes of continuous aerobic without signs/symptoms of physical distress      Intensity   THRR 40-80% of Max Heartrate 111-148    Ratings of Perceived Exertion 11-13    Perceived Dyspnea 0-4      Progression   Progression Continue to progress workloads to maintain intensity without signs/symptoms of physical distress.      Resistance Training   Training Prescription Yes    Weight 3    Reps 10-15             Perform Capillary Blood Glucose checks as needed.  Exercise Prescription Changes:   Exercise Prescription Changes     Row Name 07/01/22 1600             Response to Exercise   Blood Pressure (Admit) 148/80       Blood Pressure (Exercise) 138/80       Blood Pressure (Exit) 124/80       Heart Rate (Admit) 75 bpm       Heart Rate (Exercise) 104 bpm       Heart Rate (Exit) 88 bpm       Oxygen Saturation (Admit) 96 %       Oxygen Saturation (Exercise) 97 %       Oxygen Saturation (Exit) 96 %       Rating of Perceived Exertion (Exercise) 19       Perceived Dyspnea (Exercise) 4       Symptoms yes  Patient reported that he had 8/10 chest pain that was relieved to his baseline 4/10 chronic chest pain with rest. He also reported knee and hip tiredness and discomfort.       Comments 6 MWT results                Exercise Comments:   Exercise Goals and Review:   Exercise Goals     Row Name 07/01/22 1623             Exercise Goals   Increase Physical Activity Yes       Intervention Provide advice, education, support and counseling about physical activity/exercise needs.;Develop an individualized exercise prescription for aerobic and resistive training based on initial evaluation findings, risk stratification, comorbidities and participant's personal goals.       Expected Outcomes Short Term: Attend rehab on a regular basis to increase amount of physical activity.;Long Term:  Add in home exercise to make exercise part of routine and to increase amount of physical activity.;Long Term: Exercising regularly at least 3-5 days a week.       Increase Strength and Stamina Yes       Intervention Provide advice, education, support and counseling about physical activity/exercise needs.;Develop an individualized exercise prescription for aerobic and resistive training based on initial evaluation findings, risk stratification, comorbidities and participant's personal goals.       Expected Outcomes Short Term: Increase workloads from initial exercise prescription for resistance, speed, and METs.;Short Term: Perform resistance training exercises routinely during rehab and add in resistance training at home;Long  Term: Improve cardiorespiratory fitness, muscular endurance and strength as measured by increased METs and functional capacity (6MWT)       Able to understand and use rate of perceived exertion (RPE) scale Yes       Intervention Provide education and explanation on how to use RPE scale       Expected Outcomes Short Term: Able to use RPE daily in rehab to express subjective intensity level;Long Term:  Able to use RPE to guide intensity level when exercising independently       Able to understand and use Dyspnea scale Yes       Intervention Provide education and explanation on how to use Dyspnea scale       Expected Outcomes Short Term: Able to use Dyspnea scale daily in rehab to express subjective sense of shortness of breath during exertion;Long Term: Able to use Dyspnea scale to guide intensity level when exercising independently       Knowledge and understanding of Target Heart Rate Range (THRR) Yes       Intervention Provide education and explanation of THRR including how the numbers were predicted and where they are located for reference       Expected Outcomes Short Term: Able to state/look up THRR;Short Term: Able to use daily as guideline for intensity in rehab;Long Term:  Able to use THRR to govern intensity when exercising independently       Able to check pulse independently Yes       Intervention Provide education and demonstration on how to check pulse in carotid and radial arteries.;Review the importance of being able to check your own pulse for safety during independent exercise       Expected Outcomes Short Term: Able to explain why pulse checking is important during independent exercise;Long Term: Able to check pulse independently and accurately       Understanding of Exercise Prescription Yes       Intervention Provide education, explanation, and written materials on patient's individual exercise prescription       Expected Outcomes Short Term: Able to explain program exercise prescription;Long Term: Able to explain home exercise prescription to exercise independently                Exercise Goals Re-Evaluation :   Discharge Exercise Prescription (Final Exercise Prescription Changes):  Exercise Prescription Changes - 07/01/22 1600       Response to Exercise   Blood Pressure (Admit) 148/80    Blood Pressure (Exercise) 138/80    Blood Pressure (Exit) 124/80    Heart Rate (Admit) 75 bpm    Heart Rate (Exercise) 104 bpm    Heart Rate (Exit) 88 bpm    Oxygen Saturation (Admit) 96 %    Oxygen Saturation (Exercise) 97 %    Oxygen Saturation (Exit) 96 %    Rating of Perceived Exertion (Exercise) 19    Perceived Dyspnea (Exercise) 4    Symptoms yes   Patient reported that he had 8/10 chest pain that was relieved to his baseline 4/10 chronic chest pain with rest. He also reported knee and hip tiredness and discomfort.   Comments 6 MWT results             Nutrition:  Target Goals: Understanding of nutrition guidelines, daily intake of sodium '1500mg'$ , cholesterol '200mg'$ , calories 30% from fat and 7% or less from saturated fats, daily to have 5 or more servings of fruits and vegetables.  Education: All About Nutrition: -Group instruction  provided by  verbal, written material, interactive activities, discussions, models, and posters to present general guidelines for heart healthy nutrition including fat, fiber, MyPlate, the role of sodium in heart healthy nutrition, utilization of the nutrition label, and utilization of this knowledge for meal planning. Follow up email sent as well. Written material given at graduation. Flowsheet Row Pulmonary Rehab from 01/17/2021 in [facility] Cardiac and Pulmonary Rehab  Education need identified 01/17/21       Biometrics:  Pre Biometrics - 07/01/22 1623       Pre Biometrics   Height 6' (1.829 m)    Weight 244 lb 3.2 oz (110.8 kg)    Waist Circumference 47.5 inches    Hip Circumference 41 inches    Waist to Hip Ratio 1.16 %    BMI (Calculated) 33.11    Single Leg Stand 30 seconds              Nutrition Therapy Plan and Nutrition Goals:  Nutrition Therapy & Goals - 07/01/22 1509       Nutrition Therapy   RD appointment deferred Yes   Patient would not like to meet with RD at this time, will continue to follow up.     Personal Nutrition Goals   Nutrition Goal Patient would not like to meet with RD at this time, will continue to follow up.             Nutrition Assessments:  MEDIFICTS Score Key: ?70 Need to make dietary changes  40-70 Heart Healthy Diet ? 40 Therapeutic Level Cholesterol Diet  Flowsheet Row Cardiac Rehab from 07/01/2022 in [facility] Cardiac and Pulmonary Rehab  Picture Your Plate Total Score on Admission 73      Picture Your Plate Scores: <91 Unhealthy dietary pattern with much room for improvement. 41-50 Dietary pattern unlikely to meet recommendations for good health and room for improvement. 51-60 More healthful dietary pattern, with some room for improvement.  >60 Healthy dietary pattern, although there may be some specific behaviors that could be improved.    Nutrition Goals Re-Evaluation:   Nutrition Goals Discharge (Final Nutrition Goals  Re-Evaluation):   Psychosocial: Target Goals: Acknowledge presence or absence of significant depression and/or stress, maximize coping skills, provide positive support system. Participant is able to verbalize types and ability to use techniques and skills needed for reducing stress and depression.   Education: Stress, Anxiety, and Depression - Group verbal and visual presentation to define topics covered.  Reviews how body is impacted by stress, anxiety, and depression.  Also discusses healthy ways to reduce stress and to treat/manage anxiety and depression.  Written material given at graduation.   Education: Sleep Hygiene -Provides group verbal and written instruction about how sleep can affect your health.  Define sleep hygiene, discuss sleep cycles and impact of sleep habits. Review good sleep hygiene tips.    Initial Review & Psychosocial Screening:  Initial Psych Review & Screening - 06/20/22 1443       Initial Review   Current issues with Current Psychotropic Meds;Current Anxiety/Panic      Family Dynamics   Good Support System? Yes    Comments His anxiety stems from him trying to get his disability. He takes some medication for his anxiety to help him cope. He has a good family support system and can look to his dad for support.      Barriers   Psychosocial barriers to participate in program The patient should benefit from training in stress management and relaxation.  Screening Interventions   Interventions Encouraged to exercise;To provide support and resources with identified psychosocial needs;Provide feedback about the scores to participant    Expected Outcomes Short Term goal: Utilizing psychosocial counselor, staff and physician to assist with identification of specific Stressors or current issues interfering with healing process. Setting desired goal for each stressor or current issue identified.;Long Term Goal: Stressors or current issues are controlled or  eliminated.;Short Term goal: Identification and review with participant of any Quality of Life or Depression concerns found by scoring the questionnaire.;Long Term goal: The participant improves quality of Life and PHQ9 Scores as seen by post scores and/or verbalization of changes             Quality of Life Scores:   Quality of Life - 07/01/22 1624       Quality of Life   Select Quality of Life      Quality of Life Scores   Health/Function Pre 11.57 %    Socioeconomic Pre 21 %    Psych/Spiritual Pre 18 %    Family Pre 21 %    GLOBAL Pre 15.23 %            Scores of 19 and below usually indicate a poorer quality of life in these areas.  A difference of  2-3 points is a clinically meaningful difference.  A difference of 2-3 points in the total score of the Quality of Life Index has been associated with significant improvement in overall quality of life, self-image, physical symptoms, and general health in studies assessing change in quality of life.  PHQ-9: Review Flowsheet  More data exists      07/01/2022 05/31/2022 05/23/2022 05/09/2022 04/25/2022  Depression screen PHQ 2/9  Decreased Interest '2 1 1 3 1  '$ Down, Depressed, Hopeless '2 1 1 3 2  '$ PHQ - 2 Score '4 2 2 6 3  '$ Altered sleeping '2 1 1 3 1  '$ Tired, decreased energy '3 3 1 3 1  '$ Change in appetite '1 1 1 3 1  '$ Feeling bad or failure about yourself  '1 1 1 2 1  '$ Trouble concentrating 0 0 1 2 0  Moving slowly or fidgety/restless 0 1 0 0 0  Suicidal thoughts 1 0 0 1 0  PHQ-9 Score '12 9 7 20 7  '$ Difficult doing work/chores Somewhat difficult Somewhat difficult Somewhat difficult Very difficult Somewhat difficult   Interpretation of Total Score  Total Score Depression Severity:  1-4 = Minimal depression, 5-9 = Mild depression, 10-14 = Moderate depression, 15-19 = Moderately severe depression, 20-27 = Severe depression   Psychosocial Evaluation and Intervention:  Psychosocial Evaluation - 06/20/22 1444       Psychosocial  Evaluation & Interventions   Interventions Encouraged to exercise with the program and follow exercise prescription;Stress management education;Relaxation education    Comments His anxiety stems from him trying to get his disability. He takes some medication for his anxiety to help him cope. He has a good family support system and can look to his dad for support.    Expected Outcomes Short: Start HeartTrack to help with mood. Long: Maintain a healthy mental state    Continue Psychosocial Services  Follow up required by staff             Psychosocial Re-Evaluation:   Psychosocial Discharge (Final Psychosocial Re-Evaluation):   Vocational Rehabilitation: Provide vocational rehab assistance to qualifying candidates.   Vocational Rehab Evaluation & Intervention:   Education: Education Goals: Education classes will be provided  on a variety of topics geared toward better understanding of heart health and risk factor modification. Participant will state understanding/return demonstration of topics presented as noted by education test scores.  Learning Barriers/Preferences:  Learning Barriers/Preferences - 06/20/22 1442       Learning Barriers/Preferences   Learning Barriers None    Learning Preferences Skilled Demonstration             General Cardiac Education Topics:  AED/CPR: - Group verbal and written instruction with the use of models to demonstrate the basic use of the AED with the basic ABC's of resuscitation.   Anatomy and Cardiac Procedures: - Group verbal and visual presentation and models provide information about basic cardiac anatomy and function. Reviews the testing methods done to diagnose heart disease and the outcomes of the test results. Describes the treatment choices: Medical Management, Angioplasty, or Coronary Bypass Surgery for treating various heart conditions including Myocardial Infarction, Angina, Valve Disease, and Cardiac Arrhythmias.  Written  material given at graduation. Flowsheet Row Pulmonary Rehab from 01/17/2021 in [facility] Cardiac and Pulmonary Rehab  Education need identified 01/17/21       Medication Safety: - Group verbal and visual instruction to review commonly prescribed medications for heart and lung disease. Reviews the medication, class of the drug, and side effects. Includes the steps to properly store meds and maintain the prescription regimen.  Written material given at graduation.   Intimacy: - Group verbal instruction through game format to discuss how heart and lung disease can affect sexual intimacy. Written material given at graduation..   Know Your Numbers and Heart Failure: - Group verbal and visual instruction to discuss disease risk factors for cardiac and pulmonary disease and treatment options.  Reviews associated critical values for Overweight/Obesity, Hypertension, Cholesterol, and Diabetes.  Discusses basics of heart failure: signs/symptoms and treatments.  Introduces Heart Failure Zone chart for action plan for heart failure.  Written material given at graduation. Flowsheet Row Cardiac Rehab from 07/01/2022 in [facility] Cardiac and Pulmonary Rehab  Education need identified 07/01/22       Infection Prevention: - Provides verbal and written material to individual with discussion of infection control including proper hand washing and proper equipment cleaning during exercise session. Flowsheet Row Cardiac Rehab from 07/01/2022 in [facility] Cardiac and Pulmonary Rehab  Date 07/01/22  Educator [facility]  Instruction Review Code 1- Verbalizes Understanding       Falls Prevention: - Provides verbal and written material to individual with discussion of falls prevention and safety. Flowsheet Row Cardiac Rehab from 07/01/2022 in [facility] Cardiac and Pulmonary Rehab  Date 07/01/22  Educator [facility]  Instruction Review Code 1- Verbalizes Understanding       Other: -Provides group and verbal instruction on various topics  (see comments)   Knowledge Questionnaire Score:  Knowledge Questionnaire Score - 07/01/22 1625       Knowledge Questionnaire Score   Pre Score 24/26             Core Components/Risk Factors/Patient Goals at Admission:  Personal Goals and Risk Factors at Admission - 07/01/22 1625       Core Components/Risk Factors/Patient Goals on Admission    Weight Management Yes;Weight Loss;Obesity    Intervention Weight Management: Develop a combined nutrition and exercise program designed to reach desired caloric intake, while maintaining appropriate intake of nutrient and fiber, sodium and fats, and appropriate energy expenditure required for the weight goal.;Weight Management: Provide education and appropriate resources to help participant work on and attain dietary goals.;Weight  Management/Obesity: Establish reasonable short term and long term weight goals.;Obesity: Provide education and appropriate resources to help participant work on and attain dietary goals.    Admit Weight 244 lb 8 oz (110.9 kg)    Goal Weight: Short Term 235 lb (106.6 kg)    Goal Weight: Long Term 200 lb (90.7 kg)    Expected Outcomes Short Term: Continue to assess and modify interventions until short term weight is achieved;Long Term: Adherence to nutrition and physical activity/exercise program aimed toward attainment of established weight goal;Weight Loss: Understanding of general recommendations for a balanced deficit meal plan, which promotes 1-2 lb weight loss per week and includes a negative energy balance of [phone] kcal/d;Understanding recommendations for meals to include 15-35% energy as protein, 25-35% energy from fat, 35-60% energy from carbohydrates, less than '200mg'$  of dietary cholesterol, 20-35 gm of total fiber daily;Understanding of distribution of calorie intake throughout the day with the consumption of 4-5 meals/snacks    Increase knowledge of respiratory medications and ability to use respiratory devices  properly  Yes    Intervention Provide education and demonstration as needed of appropriate use of medications, inhalers, and oxygen therapy.    Expected Outcomes Short Term: Achieves understanding of medications use. Understands that oxygen is a medication prescribed by physician. Demonstrates appropriate use of inhaler and oxygen therapy.;Long Term: Maintain appropriate use of medications, inhalers, and oxygen therapy.    Diabetes Yes    Intervention Provide education about signs/symptoms and action to take for hypo/hyperglycemia.;Provide education about proper nutrition, including hydration, and aerobic/resistive exercise prescription along with prescribed medications to achieve blood glucose in normal ranges: Fasting glucose 65-99 mg/dL    Expected Outcomes Short Term: Participant verbalizes understanding of the signs/symptoms and immediate care of hyper/hypoglycemia, proper foot care and importance of medication, aerobic/resistive exercise and nutrition plan for blood glucose control.;Long Term: Attainment of HbA1C < 7%.    Heart Failure Yes    Intervention Provide a combined exercise and nutrition program that is supplemented with education, support and counseling about heart failure. Directed toward relieving symptoms such as shortness of breath, decreased exercise tolerance, and extremity edema.    Expected Outcomes Improve functional capacity of life;Short term: Attendance in program 2-3 days a week with increased exercise capacity. Reported lower sodium intake. Reported increased fruit and vegetable intake. Reports medication compliance.;Short term: Daily weights obtained and reported for increase. Utilizing diuretic protocols set by physician.;Long term: Adoption of self-care skills and reduction of barriers for early signs and symptoms recognition and intervention leading to self-care maintenance.    Hypertension Yes    Intervention Provide education on lifestyle modifcations including regular  physical activity/exercise, weight management, moderate sodium restriction and increased consumption of fresh fruit, vegetables, and low fat dairy, alcohol moderation, and smoking cessation.;Monitor prescription use compliance.    Expected Outcomes Short Term: Continued assessment and intervention until BP is < 140/21m HG in hypertensive participants. < 130/835mHG in hypertensive participants with diabetes, heart failure or chronic kidney disease.;Long Term: Maintenance of blood pressure at goal levels.    Lipids Yes    Intervention Provide education and support for participant on nutrition & aerobic/resistive exercise along with prescribed medications to achieve LDL '70mg'$ , HDL >'40mg'$ .    Expected Outcomes Short Term: Participant states understanding of desired cholesterol values and is compliant with medications prescribed. Participant is following exercise prescription and nutrition guidelines.;Long Term: Cholesterol controlled with medications as prescribed, with individualized exercise RX and with personalized nutrition plan. Value goals: LDL < '70mg'$ , HDL >  40 mg.             Education:Diabetes - Individual verbal and written instruction to review signs/symptoms of diabetes, desired ranges of glucose level fasting, after meals and with exercise. Acknowledge that pre and post exercise glucose checks will be done for 3 sessions at entry of program. [place] from 07/01/2022 in [facility] Cardiac and Pulmonary Rehab  Date 07/01/22  Educator [facility]  Instruction Review Code 5- Refused Teaching       Core Components/Risk Factors/Patient Goals Review:    Core Components/Risk Factors/Patient Goals at Discharge (Final Review):    ITP Comments:  ITP Comments     Row Name 06/20/22 1441 07/01/22 1555         ITP Comments Virtual Visit completed. Patient informed on EP and RD appointment and 6 Minute walk test. Patient also informed of patient health questionnaires on My Chart.  Patient Verbalizes understanding. Visit diagnosis can be found in [facility] 06/14/2022. Completed 6MWT and gym orientation. Initial ITP created and sent for review to Dr. Emily Filbert, Medical Director.               Comments: initial ITP

## 2022-07-01 NOTE — Patient Instructions (Signed)
Patient Instructions  Patient Details  Name: Gabriel [name]. MRN: [id number] Date of Birth: 03-20-1971 Referring Provider:  Jolaine Artist, MD  Below are your personal goals for exercise, nutrition, and risk factors. Our goal is to help you stay on track towards obtaining and maintaining these goals. We will be discussing your progress on these goals with you throughout the program.  Initial Exercise Prescription:  Initial Exercise Prescription - 07/01/22 1600       Date of Initial Exercise RX and Referring Provider   Date 07/01/22    Referring Provider Bensimhon      Oxygen   Maintain Oxygen Saturation 88% or higher      Treadmill   MPH 1    Grade 0    Minutes 15    METs 1.8      NuStep   Level 1    SPM 80    Minutes 15    METs 2.28      T5 Nustep   Level 1    SPM 80    Minutes 15    METs 2.28      Biostep-RELP   Level 1    SPM 50    Minutes 15    METs 2.28      Track   Laps 7    Minutes 15    METs 1.38      Prescription Details   Frequency (times per week) 2    Duration Progress to 30 minutes of continuous aerobic without signs/symptoms of physical distress      Intensity   THRR 40-80% of Max Heartrate 111-148    Ratings of Perceived Exertion 11-13    Perceived Dyspnea 0-4      Progression   Progression Continue to progress workloads to maintain intensity without signs/symptoms of physical distress.      Resistance Training   Training Prescription Yes    Weight 3    Reps 10-15             Exercise Goals: Frequency: Be able to perform aerobic exercise two to three times per week in program working toward 2-5 days per week of home exercise.  Intensity: Work with a perceived exertion of 11 (fairly light) - 15 (hard) while following your exercise prescription.  We will make changes to your prescription with you as you progress through the program.   Duration: Be able to do 30 to 45 minutes of continuous aerobic exercise in addition to  a 5 minute warm-up and a 5 minute cool-down routine.   Nutrition Goals: Your personal nutrition goals will be established when you do your nutrition analysis with the dietician.  The following are general nutrition guidelines to follow: Cholesterol < '200mg'$ /day Sodium < '1500mg'$ /day Fiber: Men over 50 yrs - 30 grams per day  Personal Goals:  Personal Goals and Risk Factors at Admission - 07/01/22 1625       Core Components/Risk Factors/Patient Goals on Admission    Weight Management Yes;Weight Loss;Obesity    Intervention Weight Management: Develop a combined nutrition and exercise program designed to reach desired caloric intake, while maintaining appropriate intake of nutrient and fiber, sodium and fats, and appropriate energy expenditure required for the weight goal.;Weight Management: Provide education and appropriate resources to help participant work on and attain dietary goals.;Weight Management/Obesity: Establish reasonable short term and long term weight goals.;Obesity: Provide education and appropriate resources to help participant work on and attain dietary goals.    Admit Weight 244  lb 8 oz (110.9 kg)    Goal Weight: Short Term 235 lb (106.6 kg)    Goal Weight: Long Term 200 lb (90.7 kg)    Expected Outcomes Short Term: Continue to assess and modify interventions until short term weight is achieved;Long Term: Adherence to nutrition and physical activity/exercise program aimed toward attainment of established weight goal;Weight Loss: Understanding of general recommendations for a balanced deficit meal plan, which promotes 1-2 lb weight loss per week and includes a negative energy balance of [phone] kcal/d;Understanding recommendations for meals to include 15-35% energy as protein, 25-35% energy from fat, 35-60% energy from carbohydrates, less than '200mg'$  of dietary cholesterol, 20-35 gm of total fiber daily;Understanding of distribution of calorie intake throughout the day with the  consumption of 4-5 meals/snacks    Increase knowledge of respiratory medications and ability to use respiratory devices properly  Yes    Intervention Provide education and demonstration as needed of appropriate use of medications, inhalers, and oxygen therapy.    Expected Outcomes Short Term: Achieves understanding of medications use. Understands that oxygen is a medication prescribed by physician. Demonstrates appropriate use of inhaler and oxygen therapy.;Long Term: Maintain appropriate use of medications, inhalers, and oxygen therapy.    Diabetes Yes    Intervention Provide education about signs/symptoms and action to take for hypo/hyperglycemia.;Provide education about proper nutrition, including hydration, and aerobic/resistive exercise prescription along with prescribed medications to achieve blood glucose in normal ranges: Fasting glucose 65-99 mg/dL    Expected Outcomes Short Term: Participant verbalizes understanding of the signs/symptoms and immediate care of hyper/hypoglycemia, proper foot care and importance of medication, aerobic/resistive exercise and nutrition plan for blood glucose control.;Long Term: Attainment of HbA1C < 7%.    Heart Failure Yes    Intervention Provide a combined exercise and nutrition program that is supplemented with education, support and counseling about heart failure. Directed toward relieving symptoms such as shortness of breath, decreased exercise tolerance, and extremity edema.    Expected Outcomes Improve functional capacity of life;Short term: Attendance in program 2-3 days a week with increased exercise capacity. Reported lower sodium intake. Reported increased fruit and vegetable intake. Reports medication compliance.;Short term: Daily weights obtained and reported for increase. Utilizing diuretic protocols set by physician.;Long term: Adoption of self-care skills and reduction of barriers for early signs and symptoms recognition and intervention leading to  self-care maintenance.    Hypertension Yes    Intervention Provide education on lifestyle modifcations including regular physical activity/exercise, weight management, moderate sodium restriction and increased consumption of fresh fruit, vegetables, and low fat dairy, alcohol moderation, and smoking cessation.;Monitor prescription use compliance.    Expected Outcomes Short Term: Continued assessment and intervention until BP is < 140/56m HG in hypertensive participants. < 130/816mHG in hypertensive participants with diabetes, heart failure or chronic kidney disease.;Long Term: Maintenance of blood pressure at goal levels.    Lipids Yes    Intervention Provide education and support for participant on nutrition & aerobic/resistive exercise along with prescribed medications to achieve LDL '70mg'$ , HDL >'40mg'$ .    Expected Outcomes Short Term: Participant states understanding of desired cholesterol values and is compliant with medications prescribed. Participant is following exercise prescription and nutrition guidelines.;Long Term: Cholesterol controlled with medications as prescribed, with individualized exercise RX and with personalized nutrition plan. Value goals: LDL < '70mg'$ , HDL > 40 mg.             Tobacco Use Initial Evaluation: Social History   Tobacco Use  Smoking Status Former  Packs/day: 0.50   Years: 33.00   Total pack years: 16.50   Types: Cigarettes   Quit date: 05/08/2020   Years since quitting: 2.1  Smokeless Tobacco Former   Quit date: 05/08/2020  Tobacco Comments   Quit Sept 2021    Exercise Goals and Review:  Exercise Goals     Row Name 07/01/22 1623             Exercise Goals   Increase Physical Activity Yes       Intervention Provide advice, education, support and counseling about physical activity/exercise needs.;Develop an individualized exercise prescription for aerobic and resistive training based on initial evaluation findings, risk stratification,  comorbidities and participant's personal goals.       Expected Outcomes Short Term: Attend rehab on a regular basis to increase amount of physical activity.;Long Term: Add in home exercise to make exercise part of routine and to increase amount of physical activity.;Long Term: Exercising regularly at least 3-5 days a week.       Increase Strength and Stamina Yes       Intervention Provide advice, education, support and counseling about physical activity/exercise needs.;Develop an individualized exercise prescription for aerobic and resistive training based on initial evaluation findings, risk stratification, comorbidities and participant's personal goals.       Expected Outcomes Short Term: Increase workloads from initial exercise prescription for resistance, speed, and METs.;Short Term: Perform resistance training exercises routinely during rehab and add in resistance training at home;Long Term: Improve cardiorespiratory fitness, muscular endurance and strength as measured by increased METs and functional capacity (6MWT)       Able to understand and use rate of perceived exertion (RPE) scale Yes       Intervention Provide education and explanation on how to use RPE scale       Expected Outcomes Short Term: Able to use RPE daily in rehab to express subjective intensity level;Long Term:  Able to use RPE to guide intensity level when exercising independently       Able to understand and use Dyspnea scale Yes       Intervention Provide education and explanation on how to use Dyspnea scale       Expected Outcomes Short Term: Able to use Dyspnea scale daily in rehab to express subjective sense of shortness of breath during exertion;Long Term: Able to use Dyspnea scale to guide intensity level when exercising independently       Knowledge and understanding of Target Heart Rate Range (THRR) Yes       Intervention Provide education and explanation of THRR including how the numbers were predicted and where they  are located for reference       Expected Outcomes Short Term: Able to state/look up THRR;Short Term: Able to use daily as guideline for intensity in rehab;Long Term: Able to use THRR to govern intensity when exercising independently       Able to check pulse independently Yes       Intervention Provide education and demonstration on how to check pulse in carotid and radial arteries.;Review the importance of being able to check your own pulse for safety during independent exercise       Expected Outcomes Short Term: Able to explain why pulse checking is important during independent exercise;Long Term: Able to check pulse independently and accurately       Understanding of Exercise Prescription Yes       Intervention Provide education, explanation, and written materials on patient's individual exercise prescription  Expected Outcomes Short Term: Able to explain program exercise prescription;Long Term: Able to explain home exercise prescription to exercise independently                Copy of goals given to participant.

## 2022-07-05 ENCOUNTER — Ambulatory Visit (INDEPENDENT_AMBULATORY_CARE_PROVIDER_SITE_OTHER): Payer: Medicaid Other | Admitting: Student in an Organized Health Care Education/Training Program

## 2022-07-05 ENCOUNTER — Encounter: Payer: Self-pay | Admitting: Student in an Organized Health Care Education/Training Program

## 2022-07-05 VITALS — BP 122/80 | HR 89 | Temp 97.9°F | Ht 72.0 in | Wt 240.4 lb

## 2022-07-05 DIAGNOSIS — R0602 Shortness of breath: Secondary | ICD-10-CM

## 2022-07-05 DIAGNOSIS — G4733 Obstructive sleep apnea (adult) (pediatric): Secondary | ICD-10-CM

## 2022-07-05 NOTE — Progress Notes (Signed)
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 He has a significant cardiac history (multi-vessel CAD, HFrEF, last EF 30%), PE (Diagnosed 12/2019 on Rivaroxaban) and emphysema on imaging (history of smoking).   He's had a [place] in 2021 showing combined pre and post capillary pulmonary hypertension. He most certainly has a combination of Group 2 and Group 3 pulmonary hypertension given his heart failure, emphysema, and history of OSA. He does have a history of psoriasis (? Other associated connective tissue diseases) and has had VTE (? group 4). I will initiate workup with a pulmonary function test to further evaluate for COPD prior to initiation of any inhalers. I will also re-order a sleep study to start management of his OSA. Finally, will consider expanding an auto-immune workup during his follow up visit and continue to consider CTEPH in the differential pending the result from his PFT ([facility]).  - Pulmonary Function Test [facility] Only; Future  #OSA not on CPAP  History of significant sleep apnea with an AHI of 100 that improved with BiPAP 26/22. Treatment will benefit his HFrEF as well as possible pulmonary hypertension. He will need to repeat his sleep study to qualify for NIV. Adaptive servoventilation is contraindicated for him given CHF.  - Split night study; Future   Return in about 3 months (around 10/05/2022).  I spent 70 minutes caring for this patient today, including preparing to see the patient, obtaining and/or reviewing separately obtained history, performing a medically appropriate examination and/or evaluation, counseling and educating the patient/family/caregiver, ordering medications, tests, or procedures, documenting clinical information in  the electronic health record, and independently interpreting results (not separately reported/billed) and communicating results to the patient/family/caregiver  [name], MD [facility] 07/05/2022 3:26 PM    End of visit medications:  No orders of the defined types were placed in this encounter.    Current Outpatient Medications:    carvedilol (COREG) 3.125 MG tablet, Take 1 tablet (3.125 mg total) by mouth 2 (two) times daily with a meal., Disp: 60 tablet, Rfl: 3   clobetasol (TEMOVATE) 0.05 % external solution, Apply 1 Application topically 2 (two) times daily., Disp: 50 mL, Rfl: 1   Continuous Blood Gluc Sensor (FREESTYLE LIBRE 3 SENSOR) MISC, Place 1 sensor on the skin every 14 days. Use to check glucose continuously, Disp: 6 each, Rfl: 3   dapagliflozin propanediol (FARXIGA) 10 MG TABS tablet, Take 1 tablet (10 mg total) by mouth daily., Disp: 30 tablet, Rfl: 5   fenofibrate (TRICOR) 145 MG tablet, Take 1 tablet (145 mg total) by mouth daily., Disp: 90 tablet, Rfl: 3   FLUoxetine (PROZAC) 20 MG capsule, Take 1 capsule (20 mg total) by mouth daily., Disp: 90 capsule, Rfl: 3   Furosemide (FUROSCIX) 80 MG/10ML CTKT, Inject 1 Dose into the skin., Disp: , Rfl:    insulin aspart (NOVOLOG) 100 UNIT/ML FlexPen, Inject 5 units + correction before breakfast/lunch and 8 units + correction before supper (correction = 1 unit per 50 mg/dL above 150); max daily dose 30 units, Disp: 15 mL, Rfl: 3   Insulin Glargine (BASAGLAR KWIKPEN) 100 UNIT/ML, Inject 36 Units into the skin at bedtime., Disp: 30 mL, Rfl: 3   Insulin Pen Needle (PEN NEEDLES) 32G X 4 MM MISC, 200 each by  Does not apply route at bedtime., Disp: 200 each, Rfl: 3   Ipratropium-Albuterol (COMBIVENT RESPIMAT) 20-100 MCG/ACT AERS respimat, Inhale 1 puff into the lungs every 6 (six) hours as needed for wheezing., Disp: , Rfl:    meclizine (ANTIVERT) 25 MG tablet, Take 1 tablet (25 mg total) by mouth 3 (three) times  daily as needed for dizziness., Disp: 15 tablet, Rfl: 0   nitroGLYCERIN (NITROSTAT) 0.4 MG SL tablet, Place 1 tablet (0.4 mg total) under the tongue every 5 (five) minutes x 3 doses as needed for chest pain., Disp: 30 tablet, Rfl: 0   potassium chloride SA (KLOR-CON M) 20 MEQ tablet, Take 20 mEq by mouth every other day., Disp: , Rfl:    rivaroxaban (XARELTO) 20 MG TABS tablet, Take 1 tablet (20 mg total) by mouth daily with supper., Disp: 90 tablet, Rfl: 3   rosuvastatin (CRESTOR) 40 MG tablet, Take 1 tablet (40 mg total) by mouth daily., Disp: 90 tablet, Rfl: 3   sacubitril-valsartan (ENTRESTO) 49-51 MG, Take 1 tablet by mouth 2 (two) times daily., Disp: 60 tablet, Rfl: 3   Semaglutide, 1 MG/DOSE, 4 MG/3ML SOPN, Inject 1 mg as directed once a week., Disp: 3 mL, Rfl: 3   spironolactone (ALDACTONE) 25 MG tablet, Take 1 tablet (25 mg total) by mouth daily., Disp: 90 tablet, Rfl: 3   torsemide (DEMADEX) 20 MG tablet, Take 40 mg by mouth daily., Disp: , Rfl:    clobetasol ointment (TEMOVATE) 3.66 %, Apply 1 Application topically 2 (two) times daily. (Patient not taking: Reported on 06/20/2022), Disp: 60 g, Rfl: 1   HYDROcodone-acetaminophen (NORCO/VICODIN) 5-325 MG tablet, Take 1 tablet by mouth every 8 (eight) hours as needed for severe pain. (Patient not taking: Reported on 06/20/2022), Disp: 8 tablet, Rfl: 0   Semaglutide,0.25 or 0.'5MG'$ /DOS, (OZEMPIC, 0.25 OR 0.5 MG/DOSE,) 2 MG/1.5ML SOPN, Inject into the skin. (Patient not taking: Reported on 07/05/2022), Disp: , Rfl:    Subjective:   PATIENT ID: Gabriel [name]. GENDER: male DOB: 02/07/1971, MRN: [id number]  Chief Complaint  Patient presents with   Consult    SOB for years. Some wheezing. Occasional cough.     HPI  Gabriel [name] is a pleasant 51 year old male patient with a past medical history most notable for CAD, heart failure, diabetes, hypertension, PE on Xarelto, and OSA who is presenting to clinic for the evaluation of shortness of  breath as well as an obstructive sleep apnea evaluation.  Patient reports that he has been short of breath with exertion since 2016 when he had his heart attack.  Over the past 7 years he has been experiencing exertional dyspnea with minimal activity. He reports an occasional cough that is dry and nonproductive.  He sometimes feels chest tightness but denies any wheezing.  He has had multiple hospitalizations for decompensated chins of his heart failure whereby he was treated for COPD as well. He has been closely followed by cardiology and is on an aggressive regimen for the management of heart failure (ARNI, Spironolactone, Torsemide, Carvedilol, Dapagliflozin) as well as on Rivaroxaban for pulmonary embolism. He also has a history of diabetes medically managed with insulin.  Patient does not endorse any wheezing or chest pain/tightness. His lower extremity edema is stable with his current dose of diuretics. He has an ICD placed and is awaiting a CardioMEMS device. He had a RHC in October of 2021 while hospitalized for heart failure showing RA 9, RV 57/10, PA 57/28 (40), PCWP 17, CO 3.1 and  CI of 1.3. PVR calculated at 7.4 [place].  He was previously diagnosed with OSA with a sleep study in 2021 - at that time he had an AHI of 100 requiring a split night sleep study with titration.This did not resolve with CPAP and BiPAP resolved his AHI once titrated up to 26/22. He tells me he was given a sleep machine that was taken away from him secondary to insurance issues that he is unsure of. He has underwent multiple CT scans of the chest with the report noting emphysema which he is inquiring about.  Patient is not on any inhalers but is on multiple medications for the management of his cardiac condition.  He does have a history of smoking and reports having smoked at least 2 packs a day for over 40 years. 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  Ancillary information including prior medications, full medical/surgical/family/social histories, and PFTs (when available) are listed below and have been reviewed.   Review of Systems  Constitutional:  Negative for chills, fever and weight loss.  Respiratory:  Positive for cough and shortness of breath. Negative for wheezing.   Cardiovascular:  Negative for chest pain.     Objective:   Vitals:   07/05/22 1406  BP: 122/80  Pulse: 89  Temp: 97.9 F (36.6 C)  SpO2: 97%  Weight: 240 lb 6.4 oz (109 kg)  Height: 6' (1.829 m)   97% on RA  BMI Readings from Last 3 Encounters:  07/05/22 32.60 kg/m  07/01/22 33.12 kg/m  06/14/22 33.40 kg/m   Wt Readings from Last 3 Encounters:  07/05/22 240 lb 6.4 oz (109 kg)  07/01/22 244 lb 3.2 oz (110.8 kg)  06/14/22 246 lb 4 oz (111.7 kg)    Physical Exam Constitutional:      Appearance: He is obese. He is not ill-appearing.  HENT:     Nose: Nose normal.     Mouth/Throat:     Mouth: Mucous membranes are moist.  Neck:     Comments: Thick neck, facial hair Cardiovascular:     Rate and Rhythm: Normal rate and regular rhythm.     Pulses: Normal pulses.     Heart sounds: Normal heart sounds.  Pulmonary:     Effort: Pulmonary effort is normal.     Breath sounds: Normal breath sounds. No wheezing or rales.  Abdominal:     General: There is distension.     Palpations: Abdomen is soft.  Neurological:     General: No focal deficit present.     Mental Status: He is alert and oriented to person, place, and time. Mental status is at baseline.       Ancillary Information    Past Medical History:  Diagnosis Date   CAD (coronary artery disease)    a. 04/2015 low risk MV;  b. 12/2016 Cath: minor irregs in LAD/Diag/LCX/OM, RCA 40p/m/d; c. 05/2020 Cath: LM nl, LAD 50d, LCX 30p, OM1 40, RCA 100p w/ L->R collats. CO/CI 3.1/1.3-->Med rx.   Chest wall pain, chronic    Chronic Troponin Elevation    CKD (chronic kidney  disease), stage II-III    COPD (chronic obstructive pulmonary disease) (HCC)    Diabetes mellitus without complication ([place])    HFrEF (heart failure with reduced ejection fraction) ([place])    a. 03/2015 Echo: EF 45-50%; b. 12/2015 Echo: EF 20-25%; c. 02/2016 Echo: EF 30-35%; d. 11/2016 Echo: EF 40-45%; e. 06/2019  Echo: EF 30-35%; f. 11/2019 Echo: EF 25-30%; g. 05/2020 Echo: EF 35-40%; h. 05/2021 Echo: EF 40-45%; i. 11/2021 Echo: EF 20-25%, glob HK, mild LVH, GrIII DD, sev red RV fxn, mild MR.   Hypertension    Mitral regurgitation    Mild to moderate by October 2021 echocardiogram.   Mixed Ischemic & NICM (nonischemic cardiomyopathy) ([place])    a. 03/2015 Echo: EF 45-50%; b. 12/2015 Echo: EF 20-25%; c. 02/2016 Echo: EF 30-35%; d. 11/2016 Echo: EF 40-45%; e. 06/2019 Echo: EF 30-35%; f. 11/2019 Echo: EF 25-30%; g. 05/2020 Echo: EF 35-40%; h. 05/2021 Echo: EF 40-45%; i. 11/2021 Echo: EF 20-25%   Myocardial infarct (HCC)    NSVT (nonsustained ventricular tachycardia) ([place])    a. 12/2015 noted on tele-->amio;  b. 12/2015 Event monitor: no VT noted.   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  12/31/19: RUL and RLL PEs.   Syncope    a. 01/2016 - felt to be vasovagal.     Family History  Problem Relation Age of Onset   Diabetes Mother    Diabetes Mellitus II Mother    Hypothyroidism Mother    Hypertension Mother    Kidney failure Mother        Dialysis   Heart attack Mother        61 yo approximately   Hypertension Father    Gout Father    Cancer Maternal Grandfather    Diabetes Maternal Grandfather    Cancer Paternal Aunt      Past Surgical History:  Procedure Laterality Date   AMPUTATION     CARDIAC CATHETERIZATION     FINGER AMPUTATION     Traumatic   FINGER FRACTURE SURGERY Left    LEFT HEART CATH AND CORONARY ANGIOGRAPHY N/A 01/06/2017   Procedure: Left Heart Cath and Coronary Angiography;  Surgeon: Wellington Hampshire,  MD;  Location: [facility];  Service: Cardiovascular;  Laterality: N/A;   RIGHT/LEFT HEART CATH AND CORONARY ANGIOGRAPHY N/A 06/13/2020   Procedure: RIGHT/LEFT HEART CATH AND CORONARY ANGIOGRAPHY;  Surgeon: Nelva Bush, MD;  Location: [facility];  Service: Cardiovascular;  Laterality: N/A;   SUBQ ICD IMPLANT N/A 05/20/2022   Procedure: SUBQ ICD IMPLANT;  Surgeon: Vickie Epley, MD;  Location: [facility];  Service: Cardiovascular;  Laterality: N/A;    Social History   Socioeconomic History   Marital status: Widowed    Spouse name: Not on file   Number of children: 1   Years of education: 58   Highest education level: 12th grade  Occupational History   Occupation: disabled  Tobacco Use   Smoking status: Former    Packs/day: 0.50    Years: 33.00    Total pack years: 16.50    Types: Cigarettes    Quit date: 05/08/2020    Years since quitting: 2.1   Smokeless tobacco: Former    Quit date: 05/08/2020   Tobacco comments:    Quit Sept 2021  Vaping Use   Vaping Use: Former  Substance and Sexual Activity   Alcohol use: Not Currently    Comment: occassionally   Drug use: No   Sexual activity: Not Currently  Other Topics Concern   Not on file  Social History Narrative   Lives at home with wife and his dad's wife.        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    Social Determinants of Health   Financial Resource Strain: High Risk (04/23/2022)   Overall Financial Resource Strain (CARDIA)    Difficulty of Paying Living Expenses: Hard  Food Insecurity: Food Insecurity Present (05/14/2022)   Hunger Vital Sign    Worried About Running Out of Food in the Last Year: Often true    Ran Out of Food in the Last Year: Sometimes true  Transportation Needs: No Transportation Needs (05/14/2022)   PRAPARE - Hydrologist (Medical): No    Lack of Transportation  (Non-Medical): No  Physical Activity: Insufficiently Active (01/20/2018)   Exercise Vital Sign    Days of Exercise per Week: 7 days    Minutes of Exercise per Session: 10 min  Stress: Stress Concern Present (09/02/2017)   [place]    Feeling of Stress : Very much  Social Connections: Moderately Isolated (09/02/2017)   Social Connection and Isolation Panel [NHANES]    Frequency of Communication with Friends and Family: Twice a week    Frequency of Social Gatherings with Friends and Family: Once a week    Attends Religious Services: Never    Marine scientist or Organizations: No    Attends Archivist Meetings: Never    Marital Status: Widowed  Intimate Partner Violence: Not At Risk (05/14/2022)   Humiliation, Afraid, Rape, and Kick questionnaire    Fear of Current or Ex-Partner: No    Emotionally Abused: No    Physically Abused: No    Sexually Abused: No     Allergies  Allergen Reactions   Metformin And Related Nausea And Vomiting   Prednisone Other (See Comments)    Reaction: Hallucinations     Zantac [Ranitidine Hcl] Diarrhea and Nausea Only    Night sweats     CBC    Component Value Date/Time   WBC 6.5 05/15/2022 0522   RBC 4.69 05/15/2022 0522   HGB 13.6 05/15/2022 0522   HGB 17.0 11/05/2021 1403   HCT 39.9 05/15/2022 0522   HCT 51.9 (H) 11/05/2021 1403   PLT 131 (L) 05/15/2022 0522   PLT 170 11/05/2021 1403   MCV 85.1 05/15/2022 0522   MCV 86 11/05/2021 1403   MCV 85 10/07/2013 1703   MCH 29.0 05/15/2022 0522   MCHC 34.1 05/15/2022 0522   RDW 12.9 05/15/2022 0522   RDW 14.3 11/05/2021 1403   RDW 14.6 (H) 10/07/2013 1703   LYMPHSABS 2.6 05/13/2022 1642   LYMPHSABS 1.8 11/05/2021 1403   LYMPHSABS 2.3 08/26/2012 1200   MONOABS 0.7 05/13/2022 1642   MONOABS 0.9 08/26/2012 1200   EOSABS 0.1 05/13/2022 1642   EOSABS 0.1 11/05/2021 1403   EOSABS 0.1 08/26/2012 1200   BASOSABS 0.1  05/13/2022 1642   BASOSABS 0.1 11/05/2021 1403   BASOSABS 0.1 08/26/2012 1200    Pulmonary Functions Testing Results:     No data to display          Outpatient Medications Prior to Visit  Medication Sig Dispense Refill   carvedilol (COREG) 3.125 MG tablet Take 1 tablet (3.125 mg total) by mouth 2 (two) times daily with a meal. 60 tablet 3   clobetasol (TEMOVATE) 0.05 % external solution Apply 1 Application topically 2 (two) times daily. 50 mL 1   Continuous Blood Gluc Sensor (FREESTYLE LIBRE 3 SENSOR) MISC Place 1 sensor on the skin every 14 days.  Use to check glucose continuously 6 each 3   dapagliflozin propanediol (FARXIGA) 10 MG TABS tablet Take 1 tablet (10 mg total) by mouth daily. 30 tablet 5   fenofibrate (TRICOR) 145 MG tablet Take 1 tablet (145 mg total) by mouth daily. 90 tablet 3   FLUoxetine (PROZAC) 20 MG capsule Take 1 capsule (20 mg total) by mouth daily. 90 capsule 3   Furosemide (FUROSCIX) 80 MG/10ML CTKT Inject 1 Dose into the skin.     insulin aspart (NOVOLOG) 100 UNIT/ML FlexPen Inject 5 units + correction before breakfast/lunch and 8 units + correction before supper (correction = 1 unit per 50 mg/dL above 150); max daily dose 30 units 15 mL 3   Insulin Glargine (BASAGLAR KWIKPEN) 100 UNIT/ML Inject 36 Units into the skin at bedtime. 30 mL 3   Insulin Pen Needle (PEN NEEDLES) 32G X 4 MM MISC 200 each by Does not apply route at bedtime. 200 each 3   Ipratropium-Albuterol (COMBIVENT RESPIMAT) 20-100 MCG/ACT AERS respimat Inhale 1 puff into the lungs every 6 (six) hours as needed for wheezing.     meclizine (ANTIVERT) 25 MG tablet Take 1 tablet (25 mg total) by mouth 3 (three) times daily as needed for dizziness. 15 tablet 0   nitroGLYCERIN (NITROSTAT) 0.4 MG SL tablet Place 1 tablet (0.4 mg total) under the tongue every 5 (five) minutes x 3 doses as needed for chest pain. 30 tablet 0   potassium chloride SA (KLOR-CON M) 20 MEQ tablet Take 20 mEq by mouth every other  day.     rivaroxaban (XARELTO) 20 MG TABS tablet Take 1 tablet (20 mg total) by mouth daily with supper. 90 tablet 3   rosuvastatin (CRESTOR) 40 MG tablet Take 1 tablet (40 mg total) by mouth daily. 90 tablet 3   sacubitril-valsartan (ENTRESTO) 49-51 MG Take 1 tablet by mouth 2 (two) times daily. 60 tablet 3   Semaglutide, 1 MG/DOSE, 4 MG/3ML SOPN Inject 1 mg as directed once a week. 3 mL 3   spironolactone (ALDACTONE) 25 MG tablet Take 1 tablet (25 mg total) by mouth daily. 90 tablet 3   torsemide (DEMADEX) 20 MG tablet Take 40 mg by mouth daily.     clobetasol ointment (TEMOVATE) 4.00 % Apply 1 Application topically 2 (two) times daily. (Patient not taking: Reported on 06/20/2022) 60 g 1   HYDROcodone-acetaminophen (NORCO/VICODIN) 5-325 MG tablet Take 1 tablet by mouth every 8 (eight) hours as needed for severe pain. (Patient not taking: Reported on 06/20/2022) 8 tablet 0   Semaglutide,0.25 or 0.'5MG'$ /DOS, (OZEMPIC, 0.25 OR 0.5 MG/DOSE,) 2 MG/1.5ML SOPN Inject into the skin. (Patient not taking: Reported on 07/05/2022)     No facility-administered medications prior to visit.

## 2022-07-08 ENCOUNTER — Other Ambulatory Visit: Payer: Medicaid Other | Admitting: Pharmacist

## 2022-07-08 DIAGNOSIS — E1169 Type 2 diabetes mellitus with other specified complication: Secondary | ICD-10-CM

## 2022-07-08 MED ORDER — BLOOD GLUCOSE MONITOR KIT: PACK | 11 refills | Status: AC

## 2022-07-08 NOTE — Progress Notes (Signed)
07/08/2022 Name: Gabriel [name]. MRN: [id number] DOB: 1971/03/10  Chief Complaint  Patient presents with   Medication Management   Diabetes    Gabriel [name]. is a 51 y.o. year old male who presented for a telephone visit.   They were referred to the pharmacist by their PCP for assistance in managing diabetes and medication access.   Patient is participating in a Managed Medicaid Plan:  Yes  Subjective:  Care Team: Primary Care Provider: Tally Joe ; Next Scheduled Visit: Tally Joe Cardiologist: Lexine Baton; Next Scheduled Visit: 07/22/22  Medication Access/Adherence  Current Pharmacy:  [facility] [address]), [place] [address] [place] ([place]) [place] [address] Phone: [phone] Fax: [phone]   Patient reports affordability concerns with their medications: No  Patient reports access/transportation concerns to their pharmacy: No  Patient reports adherence concerns with their medications:  No     Diabetes:  Current medications: Farxiga 10 mg daily, Ozempic 1 mg (using 2 doses of 0.5 mg to use the supply he has); Basaglar 36 units daily, Novolog 5/5/8 units + 1 unit for every 50 mg/dL over 150 premeal Medications tried in the past: metformin - GI upset, but unclear if he ever tried XR; eGFR has improved and this could be considered moving forward  Date of Download: 06/25/22-07/08/22 % Time CGM is active: 95% Average Glucose: 176 mg/dL Glucose Management Indicator: 7.5  Glucose Variability: 17.1 (goal <36%) Time in Goal:  - Time in range 70-180: 63% - Time above range: 37% - Time below range: 0% Observed patterns: post prandial elevations and occasional overnight elevations, which patient mentions as a point of concern   Hyperlipidemia/ASCVD Risk Reduction  Current lipid lowering medications: rosuvastatin 40 mg daily, fenofibrate 145 mg daily  Antiplatelet regimen: none due to  DOAC  Heart Failure:  Current medications:  ACEi/ARB/ARNI: Entresto 49/51 mg twice daily SGLT2i: Farxiga 10 mg daily Beta blocker: carvedilol 3.125 mg twice daily Mineralocorticoid Receptor Antagonist: spironolactone 25 mg daily Diuretic regimen: torsemide 40 mg daily, extra 20 mg if swelling + potassium 20 mEq every other day  Current home blood pressure readings: 110-120 SBP    Atrial Fibrillation:  Current medications: Rate Control: carvedilol as above Rhythm Control: n/a Anticoagulation Regimen: Xarelto 20 mg daily   Health Maintenance  Health Maintenance Due  Topic Date Due   COVID-19 Vaccine (1) Never done   Zoster Vaccines- Shingrix (1 of 2) Never done   FOOT EXAM  03/02/2021     Objective: Lab Results  Component Value Date   HGBA1C 7.5 (H) 05/14/2022    Lab Results  Component Value Date   CREATININE 1.25 (H) 06/14/2022   BUN 21 (H) 06/14/2022   NA 140 06/14/2022   K 4.1 06/14/2022   CL 108 06/14/2022   CO2 23 06/14/2022    Lab Results  Component Value Date   CHOL 110 11/25/2021   HDL 17 (L) 11/25/2021   LDLCALC 62 11/25/2021   LDLDIRECT 60.6 11/23/2020   TRIG 153 (H) 11/25/2021   CHOLHDL 6.5 11/25/2021    Medications Reviewed Today     Reviewed by Osker Mason, RPH-CPP (Pharmacist) on 07/08/22 at [place] List Status: <None>   Medication Order Taking? Sig Documenting Provider Last Dose Status Informant  carvedilol (COREG) 3.125 MG tablet [id number] Yes Take 1 tablet (3.125 mg total) by mouth 2 (two) times daily with a meal. Theora Gianotti, NP Taking Active  clobetasol (TEMOVATE) 0.05 % external solution [id number]  Apply 1 Application topically 2 (two) times daily. [name], FNP  Active   clobetasol ointment (TEMOVATE) 0.05 % [id number]  Apply 1 Application topically 2 (two) times daily.  Patient not taking: Reported on 06/20/2022   [name], FNP  Active   Continuous Blood Gluc Sensor (FREESTYLE LIBRE 3 SENSOR)  [place] [id number] Yes Place 1 sensor on the skin every 14 days. Use to check glucose continuously Virginia Crews, MD Taking Active   dapagliflozin propanediol (FARXIGA) 10 MG TABS tablet [id number] Yes Take 1 tablet (10 mg total) by mouth daily. Alisa Graff, FNP Taking Active   fenofibrate (TRICOR) 145 MG tablet [id number] Yes Take 1 tablet (145 mg total) by mouth daily. Alisa Graff, FNP Taking Active   FLUoxetine (PROZAC) 20 MG capsule [id number] Yes Take 1 capsule (20 mg total) by mouth daily. [name], FNP Taking Active   Furosemide (FUROSCIX) 80 MG/10ML CTKT [id number] No Inject 1 Dose into the skin.  Patient not taking: Reported on 07/08/2022   [provider] Not Taking Active   HYDROcodone-acetaminophen (NORCO/VICODIN) 5-325 MG tablet [id number]  Take 1 tablet by mouth every 8 (eight) hours as needed for severe pain.  Patient not taking: Reported on 06/20/2022   Shirley Friar, PA-C  Active            Med Note Jodi Mourning, Toney Reil Jul 08, 2022  9:11 AM)    insulin aspart (NOVOLOG) 100 UNIT/ML FlexPen [id number] Yes Inject 5 units + correction before breakfast/lunch and 8 units + correction before supper (correction = 1 unit per 50 mg/dL above 150); max daily dose 30 units [name], FNP Taking Active   Insulin Glargine (BASAGLAR KWIKPEN) 100 UNIT/ML [id number] Yes Inject 36 Units into the skin at bedtime. Swayze, Ava, DO Taking Active     Discontinued 01/21/22 1612 (Change in therapy)            Med Note Janan Ridge   Thu Feb 14, 2022  2:37 PM)    Insulin Pen Needle (PEN NEEDLES) 32G X 4 MM MISC [id number] Yes 200 each by Does not apply route at bedtime. [name], FNP Taking Active   Ipratropium-Albuterol (COMBIVENT RESPIMAT) 20-100 MCG/ACT AERS respimat [id number] Yes Inhale 1 puff into the lungs every 6 (six) hours as needed for wheezing. [provider] Taking Active Self  meclizine (ANTIVERT) 25 MG tablet [id number] Yes Take 1  tablet (25 mg total) by mouth 3 (three) times daily as needed for dizziness. Patrecia Pour, MD Taking Active   nitroGLYCERIN (NITROSTAT) 0.4 MG SL tablet [id number] Yes Place 1 tablet (0.4 mg total) under the tongue every 5 (five) minutes x 3 doses as needed for chest pain. Lorella Nimrod, MD Taking Active Self  potassium chloride SA (KLOR-CON M) 20 MEQ tablet [id number] Yes Take 20 mEq by mouth every other day. [provider] Taking Active   rivaroxaban (XARELTO) 20 MG TABS tablet [id number] Yes Take 1 tablet (20 mg total) by mouth daily with supper. Shirley Friar, PA-C Taking Active            Med Note Jodi Mourning, Tillie Fantasia May 30, 2022  1:36 PM)    rosuvastatin (CRESTOR) 40 MG tablet [id number] Yes Take 1 tablet (40 mg total) by mouth daily. Minna Merritts, MD Taking Active   sacubitril-valsartan [facility]) 49-51 MG [id number] Yes Take 1 tablet by  mouth 2 (two) times daily. Bensimhon, Shaune Pascal, MD Taking Active   Semaglutide, 1 MG/DOSE, 4 MG/3ML SOPN [id number] Yes Inject 1 mg as directed once a week. [name], FNP Taking Active   spironolactone (ALDACTONE) 25 MG tablet [id number] Yes Take 1 tablet (25 mg total) by mouth daily. Alisa Graff, FNP Taking Active   torsemide (DEMADEX) 20 MG tablet [id number] Yes Take 40 mg by mouth daily. [provider] Taking Active            Med Note Jodi Mourning, Jakaiden Fill T   Mon Jul 08, 2022  9:08 AM) 40 mg daily, + 20 mg if feeling edema              Assessment/Plan:   Diabetes: - Currently uncontrolled but improving  - Continue Ozempic 1 mg weekly, but will ask PCP to send order for 1 mg pen strength as patient has almost finished supply of 0.5 mg. Continue Basaglar 36 units daily, but recommend to increase Novolog from 5/5/8 + correction to 6/6/9 + correction. Will discuss with PCP. Would also consider trial of metformin XR moving forward. Addendum: discussed with PCP, she is in agreement with Ozempic and insulin  adjustments, order placed under her for co-sign - Recommend to continue CGM.     Hyperlipidemia/ASCVD Risk Reduction: - Currently controlled - Recommend to continue current regimen at this time  Heart Failure: - Currently appropriately managed - Recommend to continue current regimen along with collaboration with cardiology   Atrial Fibrillation: - Currently controlled - Recommend to continue current regimen at this time   Follow Up Plan: phone call in 6 weeks  Catie Hedwig Morton, PharmD, [place] [phone]

## 2022-07-09 ENCOUNTER — Other Ambulatory Visit: Payer: Self-pay | Admitting: Family Medicine

## 2022-07-09 MED ORDER — SEMAGLUTIDE (1 MG/DOSE) 4 MG/3ML ~~LOC~~ SOPN: 1.0000 mg | PEN_INJECTOR | SUBCUTANEOUS | 0 refills | Status: DC

## 2022-07-09 MED ORDER — INSULIN ASPART 100 UNIT/ML FLEXPEN: PEN_INJECTOR | SUBCUTANEOUS | 3 refills | Status: DC

## 2022-07-09 NOTE — Addendum Note (Signed)
Addended by: Kaylyn Layer T on: 07/09/2022 08:37 AM   Modules accepted: Orders

## 2022-07-16 ENCOUNTER — Encounter: Payer: Medicaid Other | Admitting: *Deleted

## 2022-07-16 DIAGNOSIS — I5042 Chronic combined systolic (congestive) and diastolic (congestive) heart failure: Secondary | ICD-10-CM

## 2022-07-16 DIAGNOSIS — I5022 Chronic systolic (congestive) heart failure: Secondary | ICD-10-CM

## 2022-07-16 LAB — GLUCOSE, CAPILLARY
Glucose-Capillary: 123 mg/dL — ABNORMAL HIGH (ref 70–99)
Glucose-Capillary: 114 mg/dL — ABNORMAL HIGH (ref 70–99)

## 2022-07-16 NOTE — Progress Notes (Signed)
Daily Session Note  Patient Details  Name: Gabriel [name]. MRN: [id number] Date of Birth: 05/31/1971 Referring Provider:   Flowsheet Row Cardiac Rehab from 07/01/2022 in [facility]  Referring Provider Bensimhon       Encounter Date: 07/16/2022  Check In:  Session Check In - 07/16/22 1054       Check-In   Supervising physician immediately available to respond to emergencies See telemetry face sheet for immediately available ER MD    Location [facility]-Cardiac & Pulmonary Rehab    Staff Present Renita Papa, RN BSN;Jessica Reeseville, MA, RCEP, CCRP, Marylynn Pearson, MS, ASCM CEP, Exercise Physiologist    Virtual Visit No    Medication changes reported     No    Fall or balance concerns reported    No    Warm-up and Cool-down Performed on first and last piece of equipment    Resistance Training Performed Yes    VAD Patient? No    PAD/SET Patient? No      Pain Assessment   Currently in Pain? No/denies                Social History   Tobacco Use  Smoking Status Former   Packs/day: 0.50   Years: 33.00   Total pack years: 16.50   Types: Cigarettes   Quit date: 05/08/2020   Years since quitting: 2.1  Smokeless Tobacco Former   Quit date: 05/08/2020  Tobacco Comments   Quit Sept 2021    Goals Met:  Independence with exercise equipment Exercise tolerated well No report of concerns or symptoms today Strength training completed today  Goals Unmet:  Not Applicable  Comments:First full day of exercise!  Patient was oriented to gym and equipment including functions, settings, policies, and procedures.  Patient's individual exercise prescription and treatment plan were reviewed.  All starting workloads were established based on the results of the 6 minute walk test done at initial orientation visit.  The plan for exercise progression was also introduced and progression will be customized based on patient's performance and goals.     Dr.  Emily Filbert is Medical Director for [place].  Dr. Ottie Glazier is Medical Director for [facility] Pulmonary Rehabilitation.

## 2022-07-17 ENCOUNTER — Encounter: Payer: Self-pay | Admitting: *Deleted

## 2022-07-17 DIAGNOSIS — I5022 Chronic systolic (congestive) heart failure: Secondary | ICD-10-CM

## 2022-07-17 NOTE — Progress Notes (Signed)
Cardiac Individual Treatment Plan  Patient Details  Name: Gabriel [name]. MRN: [id number] Date of Birth: 09-19-1970 Referring Provider:   Flowsheet Row Cardiac Rehab from 07/01/2022 in [facility] Cardiac and Pulmonary Rehab  Referring Provider Bensimhon       Initial Encounter Date:  Flowsheet Row Cardiac Rehab from 07/01/2022 in [facility] Cardiac and Pulmonary Rehab  Date 07/01/22       Visit Diagnosis: Heart failure, chronic systolic (HCC)  Patient's Home Medications on Admission:  Current Outpatient Medications:    Semaglutide, 1 MG/DOSE, 4 MG/3ML SOPN, Inject 1 mg as directed once a week., Disp: 9 mL, Rfl: 0   blood glucose meter kit and supplies KIT, Dispense based on patient and insurance preference. Use up to four times daily as directed., Disp: 1 each, Rfl: 11   carvedilol (COREG) 3.125 MG tablet, Take 1 tablet (3.125 mg total) by mouth 2 (two) times daily with a meal., Disp: 60 tablet, Rfl: 3   clobetasol (TEMOVATE) 0.05 % external solution, Apply 1 Application topically 2 (two) times daily., Disp: 50 mL, Rfl: 1   clobetasol ointment (TEMOVATE) 8.03 %, Apply 1 Application topically 2 (two) times daily. (Patient not taking: Reported on 06/20/2022), Disp: 60 g, Rfl: 1   Continuous Blood Gluc Sensor (FREESTYLE LIBRE 3 SENSOR) MISC, Place 1 sensor on the skin every 14 days. Use to check glucose continuously, Disp: 6 each, Rfl: 3   dapagliflozin propanediol (FARXIGA) 10 MG TABS tablet, Take 1 tablet (10 mg total) by mouth daily., Disp: 30 tablet, Rfl: 5   fenofibrate (TRICOR) 145 MG tablet, Take 1 tablet (145 mg total) by mouth daily., Disp: 90 tablet, Rfl: 3   FLUoxetine (PROZAC) 20 MG capsule, Take 1 capsule (20 mg total) by mouth daily., Disp: 90 capsule, Rfl: 3   Furosemide (FUROSCIX) 80 MG/10ML CTKT, Inject 1 Dose into the skin. (Patient not taking: Reported on 07/08/2022), Disp: , Rfl:    HYDROcodone-acetaminophen (NORCO/VICODIN) 5-325 MG tablet, Take 1 tablet by mouth every 8  (eight) hours as needed for severe pain. (Patient not taking: Reported on 06/20/2022), Disp: 8 tablet, Rfl: 0   insulin aspart (NOVOLOG) 100 UNIT/ML FlexPen, Inject 6 units + correction before breakfast/lunch and 9 units + correction before supper (correction = 1 unit per 50 mg/dL above 150); max daily dose 36 units, Disp: 15 mL, Rfl: 3   Insulin Glargine (BASAGLAR KWIKPEN) 100 UNIT/ML, Inject 36 Units into the skin at bedtime., Disp: 30 mL, Rfl: 3   Insulin Pen Needle (PEN NEEDLES) 32G X 4 MM MISC, 200 each by Does not apply route at bedtime., Disp: 200 each, Rfl: 3   Ipratropium-Albuterol (COMBIVENT RESPIMAT) 20-100 MCG/ACT AERS respimat, Inhale 1 puff into the lungs every 6 (six) hours as needed for wheezing., Disp: , Rfl:    meclizine (ANTIVERT) 25 MG tablet, Take 1 tablet (25 mg total) by mouth 3 (three) times daily as needed for dizziness., Disp: 15 tablet, Rfl: 0   nitroGLYCERIN (NITROSTAT) 0.4 MG SL tablet, Place 1 tablet (0.4 mg total) under the tongue every 5 (five) minutes x 3 doses as needed for chest pain., Disp: 30 tablet, Rfl: 0   potassium chloride SA (KLOR-CON M) 20 MEQ tablet, Take 20 mEq by mouth every other day., Disp: , Rfl:    rivaroxaban (XARELTO) 20 MG TABS tablet, Take 1 tablet (20 mg total) by mouth daily with supper., Disp: 90 tablet, Rfl: 3   rosuvastatin (CRESTOR) 40 MG tablet, Take 1 tablet (40 mg total) by  mouth daily., Disp: 90 tablet, Rfl: 3   sacubitril-valsartan (ENTRESTO) 49-51 MG, Take 1 tablet by mouth 2 (two) times daily., Disp: 60 tablet, Rfl: 3   spironolactone (ALDACTONE) 25 MG tablet, Take 1 tablet (25 mg total) by mouth daily., Disp: 90 tablet, Rfl: 3   torsemide (DEMADEX) 20 MG tablet, Take 40 mg by mouth daily., Disp: , Rfl:   Past Medical History: Past Medical History:  Diagnosis Date   CAD (coronary artery disease)    a. 04/2015 low risk MV;  b. 12/2016 Cath: minor irregs in LAD/Diag/LCX/OM, RCA 40p/m/d; c. 05/2020 Cath: LM nl, LAD 50d, LCX 30p, OM1 40,  RCA 100p w/ L->R collats. CO/CI 3.1/1.3-->Med rx.   Chest wall pain, chronic    Chronic Troponin Elevation    CKD (chronic kidney disease), stage II-III    COPD (chronic obstructive pulmonary disease) (HCC)    Diabetes mellitus without complication ([place])    HFrEF (heart failure with reduced ejection fraction) ([place])    a. 03/2015 Echo: EF 45-50%; b. 12/2015 Echo: EF 20-25%; c. 02/2016 Echo: EF 30-35%; d. 11/2016 Echo: EF 40-45%; e. 06/2019 Echo: EF 30-35%; f. 11/2019 Echo: EF 25-30%; g. 05/2020 Echo: EF 35-40%; h. 05/2021 Echo: EF 40-45%; i. 11/2021 Echo: EF 20-25%, glob HK, mild LVH, GrIII DD, sev red RV fxn, mild MR.   Hypertension    Mitral regurgitation    Mild to moderate by October 2021 echocardiogram.   Mixed Ischemic & NICM (nonischemic cardiomyopathy) ([place])    a. 03/2015 Echo: EF 45-50%; b. 12/2015 Echo: EF 20-25%; c. 02/2016 Echo: EF 30-35%; d. 11/2016 Echo: EF 40-45%; e. 06/2019 Echo: EF 30-35%; f. 11/2019 Echo: EF 25-30%; g. 05/2020 Echo: EF 35-40%; h. 05/2021 Echo: EF 40-45%; i. 11/2021 Echo: EF 20-25%   Myocardial infarct (HCC)    NSVT (nonsustained ventricular tachycardia) ([place])    a. 12/2015 noted on tele-->amio;  b. 12/2015 Event monitor: no VT noted.   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  12/31/19: RUL and RLL PEs.   Syncope    a. 01/2016 - felt to be vasovagal.    Tobacco Use: Social History   Tobacco Use  Smoking Status Former   Packs/day: 0.50   Years: 33.00   Total pack years: 16.50   Types: Cigarettes   Quit date: 05/08/2020   Years since quitting: 2.1  Smokeless Tobacco Former   Quit date: 05/08/2020  Tobacco Comments   Quit Sept 2021    Labs: Review Flowsheet  More data exists      Latest Ref Rng & Units 11/24/2021 11/25/2021 02/08/2022 05/09/2022 05/14/2022  Labs for ITP Cardiac and Pulmonary Rehab  Cholestrol 0 - 200 mg/dL - 110  - - -  LDL (calc) 0 - 99 mg/dL - 62  - - -  HDL-C >40  mg/dL - 17  - - -  Trlycerides <150 mg/dL - 153  - - -  Hemoglobin A1c 4.8 - 5.6 % 8.7  - 9.8  7.6  7.5      Exercise Target Goals: Exercise Program Goal: Individual exercise prescription set using results from initial 6 min walk test and THRR while considering  patient's activity barriers and safety.   Exercise Prescription Goal: Initial exercise prescription builds to 30-45 minutes a day of aerobic activity, 2-3 days per week.  Home exercise guidelines will be given to patient during program as part of exercise prescription that  the participant will acknowledge.   Education: Aerobic Exercise: - Group verbal and visual presentation on the components of exercise prescription. Introduces F.I.T.T principle from ACSM for exercise prescriptions.  Reviews F.I.T.T. principles of aerobic exercise including progression. Written material given at graduation. Flowsheet Row Cardiac Rehab from 07/01/2022 in [facility] Cardiac and Pulmonary Rehab  Education need identified 07/01/22       Education: Resistance Exercise: - Group verbal and visual presentation on the components of exercise prescription. Introduces F.I.T.T principle from ACSM for exercise prescriptions  Reviews F.I.T.T. principles of resistance exercise including progression. Written material given at graduation.    Education: Exercise & Equipment Safety: - Individual verbal instruction and demonstration of equipment use and safety with use of the equipment. Flowsheet Row Cardiac Rehab from 07/01/2022 in [facility] Cardiac and Pulmonary Rehab  Date 07/01/22  Educator [facility]  Instruction Review Code 1- Verbalizes Understanding       Education: Exercise Physiology & General Exercise Guidelines: - Group verbal and written instruction with models to review the exercise physiology of the cardiovascular system and associated critical values. Provides general exercise guidelines with specific guidelines to those with heart or lung disease.     Education: Flexibility, Balance, Mind/Body Relaxation: - Group verbal and visual presentation with interactive activity on the components of exercise prescription. Introduces F.I.T.T principle from ACSM for exercise prescriptions. Reviews F.I.T.T. principles of flexibility and balance exercise training including progression. Also discusses the mind body connection.  Reviews various relaxation techniques to help reduce and manage stress (i.e. Deep breathing, progressive muscle relaxation, and visualization). Balance handout provided to take home. Written material given at graduation.   Activity Barriers & Risk Stratification:   6 Minute Walk:  6 Minute Walk     Row Name 07/01/22 1600         6 Minute Walk   Phase Initial     Distance 700 feet     Walk Time 5.75 minutes     # of Rest Breaks 1     MPH 1.38     METS 2.28     RPE 19     Perceived Dyspnea  4     VO2 Peak 7.97     Symptoms No     Resting HR 75 bpm     Resting BP 148/80     Resting Oxygen Saturation  96 %     Exercise Oxygen Saturation  during 6 min walk 96 %     Max Ex. HR 104 bpm     Max Ex. BP 138/80     2 Minute Post BP 124/80              Oxygen Initial Assessment:   Oxygen Re-Evaluation:   Oxygen Discharge (Final Oxygen Re-Evaluation):   Initial Exercise Prescription:  Initial Exercise Prescription - 07/01/22 1600       Date of Initial Exercise RX and Referring Provider   Date 07/01/22    Referring Provider Bensimhon      Oxygen   Maintain Oxygen Saturation 88% or higher      Treadmill   MPH 1    Grade 0    Minutes 15    METs 1.8      NuStep   Level 1    SPM 80    Minutes 15    METs 2.28      T5 Nustep   Level 1    SPM 80    Minutes 15    METs 2.28  Biostep-RELP   Level 1    SPM 50    Minutes 15    METs 2.28      Track   Laps 7    Minutes 15    METs 1.38      Prescription Details   Frequency (times per week) 2    Duration Progress to 30 minutes of  continuous aerobic without signs/symptoms of physical distress      Intensity   THRR 40-80% of Max Heartrate 111-148    Ratings of Perceived Exertion 11-13    Perceived Dyspnea 0-4      Progression   Progression Continue to progress workloads to maintain intensity without signs/symptoms of physical distress.      Resistance Training   Training Prescription Yes    Weight 3    Reps 10-15             Perform Capillary Blood Glucose checks as needed.  Exercise Prescription Changes:   Exercise Prescription Changes     Row Name 07/01/22 1600             Response to Exercise   Blood Pressure (Admit) 148/80       Blood Pressure (Exercise) 138/80       Blood Pressure (Exit) 124/80       Heart Rate (Admit) 75 bpm       Heart Rate (Exercise) 104 bpm       Heart Rate (Exit) 88 bpm       Oxygen Saturation (Admit) 96 %       Oxygen Saturation (Exercise) 97 %       Oxygen Saturation (Exit) 96 %       Rating of Perceived Exertion (Exercise) 19       Perceived Dyspnea (Exercise) 4       Symptoms yes  Patient reported that he had 8/10 chest pain that was relieved to his baseline 4/10 chronic chest pain with rest. He also reported knee and hip tiredness and discomfort.       Comments 6 MWT results                Exercise Comments:   Exercise Comments     Row Name 07/16/22 1055           Exercise Comments First full day of exercise!  Patient was oriented to gym and equipment including functions, settings, policies, and procedures.  Patient's individual exercise prescription and treatment plan were reviewed.  All starting workloads were established based on the results of the 6 minute walk test done at initial orientation visit.  The plan for exercise progression was also introduced and progression will be customized based on patient's performance and goals.                Exercise Goals and Review:   Exercise Goals     Row Name 07/01/22 1623              Exercise Goals   Increase Physical Activity Yes       Intervention Provide advice, education, support and counseling about physical activity/exercise needs.;Develop an individualized exercise prescription for aerobic and resistive training based on initial evaluation findings, risk stratification, comorbidities and participant's personal goals.       Expected Outcomes Short Term: Attend rehab on a regular basis to increase amount of physical activity.;Long Term: Add in home exercise to make exercise part of routine and to increase amount of physical activity.;Long Term: Exercising regularly at  least 3-5 days a week.       Increase Strength and Stamina Yes       Intervention Provide advice, education, support and counseling about physical activity/exercise needs.;Develop an individualized exercise prescription for aerobic and resistive training based on initial evaluation findings, risk stratification, comorbidities and participant's personal goals.       Expected Outcomes Short Term: Increase workloads from initial exercise prescription for resistance, speed, and METs.;Short Term: Perform resistance training exercises routinely during rehab and add in resistance training at home;Long Term: Improve cardiorespiratory fitness, muscular endurance and strength as measured by increased METs and functional capacity (6MWT)       Able to understand and use rate of perceived exertion (RPE) scale Yes       Intervention Provide education and explanation on how to use RPE scale       Expected Outcomes Short Term: Able to use RPE daily in rehab to express subjective intensity level;Long Term:  Able to use RPE to guide intensity level when exercising independently       Able to understand and use Dyspnea scale Yes       Intervention Provide education and explanation on how to use Dyspnea scale       Expected Outcomes Short Term: Able to use Dyspnea scale daily in rehab to express subjective sense of shortness of breath  during exertion;Long Term: Able to use Dyspnea scale to guide intensity level when exercising independently       Knowledge and understanding of Target Heart Rate Range (THRR) Yes       Intervention Provide education and explanation of THRR including how the numbers were predicted and where they are located for reference       Expected Outcomes Short Term: Able to state/look up THRR;Short Term: Able to use daily as guideline for intensity in rehab;Long Term: Able to use THRR to govern intensity when exercising independently       Able to check pulse independently Yes       Intervention Provide education and demonstration on how to check pulse in carotid and radial arteries.;Review the importance of being able to check your own pulse for safety during independent exercise       Expected Outcomes Short Term: Able to explain why pulse checking is important during independent exercise;Long Term: Able to check pulse independently and accurately       Understanding of Exercise Prescription Yes       Intervention Provide education, explanation, and written materials on patient's individual exercise prescription       Expected Outcomes Short Term: Able to explain program exercise prescription;Long Term: Able to explain home exercise prescription to exercise independently                Exercise Goals Re-Evaluation :  Exercise Goals Re-Evaluation     Row Name 07/16/22 1056             Exercise Goal Re-Evaluation   Exercise Goals Review Increase Physical Activity;Able to understand and use rate of perceived exertion (RPE) scale;Knowledge and understanding of Target Heart Rate Range (THRR);Understanding of Exercise Prescription;Increase Strength and Stamina;Able to check pulse independently       Comments Reviewed RPE scale, THR and program prescription with pt today.  Pt voiced understanding and was given a copy of goals to take home.       Expected Outcomes Short: Use RPE daily to regulate  intensity.  Long: Follow program prescription in THR.  Discharge Exercise Prescription (Final Exercise Prescription Changes):  Exercise Prescription Changes - 07/01/22 1600       Response to Exercise   Blood Pressure (Admit) 148/80    Blood Pressure (Exercise) 138/80    Blood Pressure (Exit) 124/80    Heart Rate (Admit) 75 bpm    Heart Rate (Exercise) 104 bpm    Heart Rate (Exit) 88 bpm    Oxygen Saturation (Admit) 96 %    Oxygen Saturation (Exercise) 97 %    Oxygen Saturation (Exit) 96 %    Rating of Perceived Exertion (Exercise) 19    Perceived Dyspnea (Exercise) 4    Symptoms yes   Patient reported that he had 8/10 chest pain that was relieved to his baseline 4/10 chronic chest pain with rest. He also reported knee and hip tiredness and discomfort.   Comments 6 MWT results             Nutrition:  Target Goals: Understanding of nutrition guidelines, daily intake of sodium <1520m, cholesterol <2037m calories 30% from fat and 7% or less from saturated fats, daily to have 5 or more servings of fruits and vegetables.  Education: All About Nutrition: -Group instruction provided by verbal, written material, interactive activities, discussions, models, and posters to present general guidelines for heart healthy nutrition including fat, fiber, MyPlate, the role of sodium in heart healthy nutrition, utilization of the nutrition label, and utilization of this knowledge for meal planning. Follow up email sent as well. Written material given at graduation. [facility]  Education need identified 01/17/21       Biometrics:  Pre Biometrics - 07/01/22 1623       Pre Biometrics   Height 6' (1.829 m)    Weight 244 lb 3.2 oz (110.8 kg)    Waist Circumference 47.5 inches    Hip Circumference 41 inches    Waist to Hip Ratio 1.16 %    BMI (Calculated) 33.11    Single Leg Stand 30 seconds               Nutrition Therapy Plan and Nutrition Goals:  Nutrition Therapy & Goals - 07/01/22 1509       Nutrition Therapy   RD appointment deferred Yes   Patient would not like to meet with RD at this time, will continue to follow up.     Personal Nutrition Goals   Nutrition Goal Patient would not like to meet with RD at this time, will continue to follow up.             Nutrition Assessments:  MEDIFICTS Score Key: ?70 Need to make dietary changes  40-70 Heart Healthy Diet ? 40 Therapeutic Level Cholesterol Diet  [facility]  Picture Your Plate Total Score on Admission 73      Picture Your Plate Scores: <4<53nhealthy dietary pattern with much room for improvement. 41-50 Dietary pattern unlikely to meet recommendations for good health and room for improvement. 51-60 More healthful dietary pattern, with some room for improvement.  >60 Healthy dietary pattern, although there may be some specific behaviors that could be improved.    Nutrition Goals Re-Evaluation:   Nutrition Goals Discharge (Final Nutrition Goals Re-Evaluation):   Psychosocial: Target Goals: Acknowledge presence or absence of significant depression and/or stress, maximize coping skills, provide positive support system. Participant is able to verbalize types and ability to use techniques  and skills needed for reducing stress and depression.   Education: Stress, Anxiety, and Depression - Group verbal and visual presentation to define topics covered.  Reviews how body is impacted by stress, anxiety, and depression.  Also discusses healthy ways to reduce stress and to treat/manage anxiety and depression.  Written material given at graduation.   Education: Sleep Hygiene -Provides group verbal and written instruction about how sleep can affect your health.  Define sleep hygiene, discuss sleep cycles and impact of sleep habits. Review good  sleep hygiene tips.    Initial Review & Psychosocial Screening:  Initial Psych Review & Screening - 06/20/22 1443       Initial Review   Current issues with Current Psychotropic Meds;Current Anxiety/Panic      Family Dynamics   Good Support System? Yes    Comments His anxiety stems from him trying to get his disability. He takes some medication for his anxiety to help him cope. He has a good family support system and can look to his dad for support.      Barriers   Psychosocial barriers to participate in program The patient should benefit from training in stress management and relaxation.      Screening Interventions   Interventions Encouraged to exercise;To provide support and resources with identified psychosocial needs;Provide feedback about the scores to participant    Expected Outcomes Short Term goal: Utilizing psychosocial counselor, staff and physician to assist with identification of specific Stressors or current issues interfering with healing process. Setting desired goal for each stressor or current issue identified.;Long Term Goal: Stressors or current issues are controlled or eliminated.;Short Term goal: Identification and review with participant of any Quality of Life or Depression concerns found by scoring the questionnaire.;Long Term goal: The participant improves quality of Life and PHQ9 Scores as seen by post scores and/or verbalization of changes             Quality of Life Scores:   Quality of Life - 07/01/22 1624       Quality of Life   Select Quality of Life      Quality of Life Scores   Health/Function Pre 11.57 %    Socioeconomic Pre 21 %    Psych/Spiritual Pre 18 %    Family Pre 21 %    GLOBAL Pre 15.23 %            Scores of 19 and below usually indicate a poorer quality of life in these areas.  A difference of  2-3 points is a clinically meaningful difference.  A difference of 2-3 points in the total score of the Quality of Life Index has been  associated with significant improvement in overall quality of life, self-image, physical symptoms, and general health in studies assessing change in quality of life.  PHQ-9: Review Flowsheet  More data exists      07/01/2022 05/31/2022 05/23/2022 05/09/2022 04/25/2022  Depression screen PHQ 2/9  Decreased Interest _0 Down, Depressed, Hopeless _1 PHQ - 2 Score _2 Altered sleeping _3 Tired, decreased energy _4 Change in appetite _5 Feeling bad or failure about yourself  _6 Trouble concentrating 0 0 1 2 0  Moving slowly or fidgety/restless 0 1 0 0 0  Suicidal thoughts 1 0 0 1 0  PHQ-9 Score 12  _0 Difficult doing work/chores Somewhat difficult Somewhat difficult Somewhat difficult Very difficult Somewhat difficult   Interpretation of Total Score  Total Score Depression Severity:  1-4 = Minimal depression, 5-9 = Mild depression, 10-14 = Moderate depression, 15-19 = Moderately severe depression, 20-27 = Severe depression   Psychosocial Evaluation and Intervention:  Psychosocial Evaluation - 06/20/22 1444       Psychosocial Evaluation & Interventions   Interventions Encouraged to exercise with the program and follow exercise prescription;Stress management education;Relaxation education    Comments His anxiety stems from him trying to get his disability. He takes some medication for his anxiety to help him cope. He has a good family support system and can look to his dad for support.    Expected Outcomes Short: Start HeartTrack to help with mood. Long: Maintain a healthy mental state    Continue Psychosocial Services  Follow up required by staff             Psychosocial Re-Evaluation:   Psychosocial Discharge (Final Psychosocial Re-Evaluation):   Vocational Rehabilitation: Provide vocational rehab assistance to qualifying candidates.   Vocational Rehab Evaluation & Intervention:   Education: Education Goals:  Education classes will be provided on a variety of topics geared toward better understanding of heart health and risk factor modification. Participant will state understanding/return demonstration of topics presented as noted by education test scores.  Learning Barriers/Preferences:  Learning Barriers/Preferences - 06/20/22 1442       Learning Barriers/Preferences   Learning Barriers None    Learning Preferences Skilled Demonstration             General Cardiac Education Topics:  AED/CPR: - Group verbal and written instruction with the use of models to demonstrate the basic use of the AED with the basic ABC's of resuscitation.   Anatomy and Cardiac Procedures: - Group verbal and visual presentation and models provide information about basic cardiac anatomy and function. Reviews the testing methods done to diagnose heart disease and the outcomes of the test results. Describes the treatment choices: Medical Management, Angioplasty, or Coronary Bypass Surgery for treating various heart conditions including Myocardial Infarction, Angina, Valve Disease, and Cardiac Arrhythmias.  Written material given at graduation. Flowsheet Row Pulmonary Rehab from 01/17/2021 in [facility] Cardiac and Pulmonary Rehab  Education need identified 01/17/21       Medication Safety: - Group verbal and visual instruction to review commonly prescribed medications for heart and lung disease. Reviews the medication, class of the drug, and side effects. Includes the steps to properly store meds and maintain the prescription regimen.  Written material given at graduation.   Intimacy: - Group verbal instruction through game format to discuss how heart and lung disease can affect sexual intimacy. Written material given at graduation..   Know Your Numbers and Heart Failure: - Group verbal and visual instruction to discuss disease risk factors for cardiac and pulmonary disease and treatment options.  Reviews associated  critical values for Overweight/Obesity, Hypertension, Cholesterol, and Diabetes.  Discusses basics of heart failure: signs/symptoms and treatments.  Introduces Heart Failure Zone chart for action plan for heart failure.  Written material given at graduation. Flowsheet Row Cardiac Rehab from 07/01/2022 in [facility] Cardiac and Pulmonary Rehab  Education need identified 07/01/22       Infection Prevention: - Provides verbal and written material to individual with discussion of infection control including proper hand washing and proper equipment cleaning during exercise session. Flowsheet Row Cardiac Rehab from 07/01/2022 in [facility] Cardiac and  Pulmonary Rehab  Date 07/01/22  Educator [facility]  Instruction Review Code 1- Verbalizes Understanding       Falls Prevention: - Provides verbal and written material to individual with discussion of falls prevention and safety. Flowsheet Row Cardiac Rehab from 07/01/2022 in [facility] Cardiac and Pulmonary Rehab  Date 07/01/22  Educator [facility]  Instruction Review Code 1- Verbalizes Understanding       Other: -Provides group and verbal instruction on various topics (see comments)   Knowledge Questionnaire Score:  Knowledge Questionnaire Score - 07/01/22 1625       Knowledge Questionnaire Score   Pre Score 24/26             Core Components/Risk Factors/Patient Goals at Admission:  Personal Goals and Risk Factors at Admission - 07/01/22 1625       Core Components/Risk Factors/Patient Goals on Admission    Weight Management Yes;Weight Loss;Obesity    Intervention Weight Management: Develop a combined nutrition and exercise program designed to reach desired caloric intake, while maintaining appropriate intake of nutrient and fiber, sodium and fats, and appropriate energy expenditure required for the weight goal.;Weight Management: Provide education and appropriate resources to help participant work on and attain dietary goals.;Weight Management/Obesity:  Establish reasonable short term and long term weight goals.;Obesity: Provide education and appropriate resources to help participant work on and attain dietary goals.    Admit Weight 244 lb 8 oz (110.9 kg)    Goal Weight: Short Term 235 lb (106.6 kg)    Goal Weight: Long Term 200 lb (90.7 kg)    Expected Outcomes Short Term: Continue to assess and modify interventions until short term weight is achieved;Long Term: Adherence to nutrition and physical activity/exercise program aimed toward attainment of established weight goal;Weight Loss: Understanding of general recommendations for a balanced deficit meal plan, which promotes 1-2 lb weight loss per week and includes a negative energy balance of [id number] kcal/d;Understanding recommendations for meals to include 15-35% energy as protein, 25-35% energy from fat, 35-60% energy from carbohydrates, less than 276m of dietary cholesterol, 20-35 gm of total fiber daily;Understanding of distribution of calorie intake throughout the day with the consumption of 4-5 meals/snacks    Increase knowledge of respiratory medications and ability to use respiratory devices properly  Yes    Intervention Provide education and demonstration as needed of appropriate use of medications, inhalers, and oxygen therapy.    Expected Outcomes Short Term: Achieves understanding of medications use. Understands that oxygen is a medication prescribed by physician. Demonstrates appropriate use of inhaler and oxygen therapy.;Long Term: Maintain appropriate use of medications, inhalers, and oxygen therapy.    Diabetes Yes    Intervention Provide education about signs/symptoms and action to take for hypo/hyperglycemia.;Provide education about proper nutrition, including hydration, and aerobic/resistive exercise prescription along with prescribed medications to achieve blood glucose in normal ranges: Fasting glucose 65-99 mg/dL    Expected Outcomes Short Term: Participant verbalizes  understanding of the signs/symptoms and immediate care of hyper/hypoglycemia, proper foot care and importance of medication, aerobic/resistive exercise and nutrition plan for blood glucose control.;Long Term: Attainment of HbA1C < 7%.    Heart Failure Yes    Intervention Provide a combined exercise and nutrition program that is supplemented with education, support and counseling about heart failure. Directed toward relieving symptoms such as shortness of breath, decreased exercise tolerance, and extremity edema.    Expected Outcomes Improve functional capacity of life;Short term: Attendance in program 2-3 days a week with increased exercise capacity. Reported lower sodium intake.  Reported increased fruit and vegetable intake. Reports medication compliance.;Short term: Daily weights obtained and reported for increase. Utilizing diuretic protocols set by physician.;Long term: Adoption of self-care skills and reduction of barriers for early signs and symptoms recognition and intervention leading to self-care maintenance.    Hypertension Yes    Intervention Provide education on lifestyle modifcations including regular physical activity/exercise, weight management, moderate sodium restriction and increased consumption of fresh fruit, vegetables, and low fat dairy, alcohol moderation, and smoking cessation.;Monitor prescription use compliance.    Expected Outcomes Short Term: Continued assessment and intervention until BP is < 140/64m HG in hypertensive participants. < 130/820mHG in hypertensive participants with diabetes, heart failure or chronic kidney disease.;Long Term: Maintenance of blood pressure at goal levels.    Lipids Yes    Intervention Provide education and support for participant on nutrition & aerobic/resistive exercise along with prescribed medications to achieve LDL <7031mHDL >75m35m  Expected Outcomes Short Term: Participant states understanding of desired cholesterol values and is compliant  with medications prescribed. Participant is following exercise prescription and nutrition guidelines.;Long Term: Cholesterol controlled with medications as prescribed, with individualized exercise RX and with personalized nutrition plan. Value goals: LDL < 70mg33mL > 40 mg.             Education:Diabetes - Individual verbal and written instruction to review signs/symptoms of diabetes, desired ranges of glucose level fasting, after meals and with exercise. Acknowledge that pre and post exercise glucose checks will be done for 3 sessions at entry of program. [place] [facility]  Date 07/01/22  Educator [facility]truction Review Code 5- Refused Teaching       Core Components/Risk Factors/Patient Goals Review:    Core Components/Risk Factors/Patient Goals at Discharge (Final Review):    ITP Comments:  ITP Comments     Row Name 06/20/22 1441 07/01/22 1555 07/16/22 1055 07/17/22 0754     ITP Comments Virtual Visit completed. Patient informed on EP and RD appointment and 6 Minute walk test. Patient also informed of patient health questionnaires on My Chart. Patient Verbalizes understanding. Visit diagnosis can be found in [facility]. Completed 6MWT and gym orientation. Initial ITP created and sent for review to Dr. Mark Emily Filbertical Director. First full day of exercise!  Patient was oriented to gym and equipment including functions, settings, policies, and procedures.  Patient's individual exercise prescription and treatment plan were reviewed.  All starting workloads were established based on the results of the 6 minute walk test done at initial orientation visit.  The plan for exercise progression was also introduced and progression will be customized based on patient's performance and goals. 30 Day review completed. Medical Director ITP review done, changes made as directed, and signed approval by Medical Director.   New to  program             Comments:

## 2022-07-18 ENCOUNTER — Other Ambulatory Visit: Payer: Self-pay

## 2022-07-18 ENCOUNTER — Telehealth: Payer: Self-pay

## 2022-07-18 ENCOUNTER — Encounter: Payer: Self-pay | Admitting: Gastroenterology

## 2022-07-18 ENCOUNTER — Encounter: Payer: Medicaid Other | Admitting: *Deleted

## 2022-07-18 ENCOUNTER — Telehealth: Payer: Self-pay | Admitting: Internal Medicine

## 2022-07-18 ENCOUNTER — Ambulatory Visit (INDEPENDENT_AMBULATORY_CARE_PROVIDER_SITE_OTHER): Payer: Medicaid Other | Admitting: Gastroenterology

## 2022-07-18 VITALS — BP 131/84 | HR 99 | Temp 98.1°F | Ht 72.0 in | Wt 247.0 lb

## 2022-07-18 DIAGNOSIS — K7469 Other cirrhosis of liver: Secondary | ICD-10-CM | POA: Diagnosis not present

## 2022-07-18 DIAGNOSIS — R1013 Epigastric pain: Secondary | ICD-10-CM | POA: Diagnosis not present

## 2022-07-18 DIAGNOSIS — Z1211 Encounter for screening for malignant neoplasm of colon: Secondary | ICD-10-CM

## 2022-07-18 DIAGNOSIS — K760 Fatty (change of) liver, not elsewhere classified: Secondary | ICD-10-CM | POA: Diagnosis not present

## 2022-07-18 DIAGNOSIS — I5022 Chronic systolic (congestive) heart failure: Secondary | ICD-10-CM | POA: Diagnosis not present

## 2022-07-18 LAB — GLUCOSE, CAPILLARY
Glucose-Capillary: 141 mg/dL — ABNORMAL HIGH (ref 70–99)
Glucose-Capillary: 138 mg/dL — ABNORMAL HIGH (ref 70–99)

## 2022-07-18 MED ORDER — NA SULFATE-K SULFATE-MG SULF 17.5-3.13-1.6 GM/177ML PO SOLN: 354.0000 mL | Freq: Once | ORAL | 0 refills | Status: AC

## 2022-07-18 NOTE — Telephone Encounter (Signed)
Patient states that he needs a prescription for Ozempic '1mg'$  sent to [address].  He states that the pharmacist, Joellen Jersey?, is wanting to up his dose and has been trying to get in touch with you.

## 2022-07-18 NOTE — Telephone Encounter (Signed)
   [facility] Pre-operative Risk Assessment    Request for surgical clearance:  What type of surgery is being performed? Colonoscopy and EGD   When is this surgery scheduled? 08/01/2022   Are there any medications that need to be held prior to surgery and how long? Xarelto   Practice name and name of physician performing surgery? [facility]   What is your office phone and fax number? [phone]   Anesthesia type (None, local, MAC, general) ? General    Gabriel [name] 07/18/2022, 3:45 PM  _________________________________________________________________   (provider comments below)

## 2022-07-18 NOTE — Progress Notes (Signed)
Daily Session Note  Patient Details  Name: Gabriel [name]. MRN: [id number] Date of Birth: 21-Aug-1970 Referring Provider:   Flowsheet Row Cardiac Rehab from 07/01/2022 in [facility] Cardiac and Pulmonary Rehab  Referring Provider Bensimhon       Encounter Date: 07/18/2022  Check In:  Session Check In - 07/18/22 1057       Check-In   Supervising physician immediately available to respond to emergencies See telemetry face sheet for immediately available ER MD    Location [facility]-Cardiac & Pulmonary Rehab    Staff Present Heath Lark, RN, BSN, CCRP;Noah Tickle, BS, Exercise Physiologist;Meredith Sherryll Burger, RN BSN;Joseph Hood, RCP,RRT,BSRT    Virtual Visit No    Medication changes reported     No    Fall or balance concerns reported    No    Warm-up and Cool-down Performed on first and last piece of equipment    Resistance Training Performed Yes    VAD Patient? No    PAD/SET Patient? No      Pain Assessment   Currently in Pain? No/denies                Social History   Tobacco Use  Smoking Status Former   Packs/day: 0.50   Years: 33.00   Total pack years: 16.50   Types: Cigarettes   Quit date: 05/08/2020   Years since quitting: 2.1  Smokeless Tobacco Former   Quit date: 05/08/2020  Tobacco Comments   Quit Sept 2021    Goals Met:  Independence with exercise equipment Exercise tolerated well No report of concerns or symptoms today  Goals Unmet:  Not Applicable  Comments: Pt able to follow exercise prescription today without complaint.  Will continue to monitor for progression.    Dr. Emily Filbert is Medical Director for [facility].  Dr. Ottie Glazier is Medical Director for [facility] Pulmonary Rehabilitation.

## 2022-07-18 NOTE — Progress Notes (Signed)
Gabriel Darby, MD [address]  [place]  [place], [place] [address]  Main: [phone]  Fax: [phone]    Gastroenterology Consultation  Referring Provider:     [name], FNP Primary Care Physician:  Jolaine Artist, MD Primary Gastroenterologist:  Dr. Cephas [name] Reason for Consultation: Upper abdominal pain        HPI:   Gabriel [name]. is a 51 y.o. male referred by Dr. Jolaine Artist, MD  for consultation & management of chronic history of upper abdominal pain associated with sulfur burps, postprandial abdominal discomfort, burping and regurgitation.  This has been ongoing for at least a year.  He has multiple comorbidities including metabolic syndrome, CHF, CKD, compensated cirrhosis of liver.  Patient used to smoke and drink alcohol, quit both at least 2 to 3 years ago.  He has defibrillator in place.  He is trying to manage his diabetes by following the diet and adherence to his medications.  NSAIDs: None  Antiplts/Anticoagulants/Anti thrombotics: Xarelto  GI Procedures: None He denies family history of GI malignancy  Past Medical History:  Diagnosis Date   CAD (coronary artery disease)    a. 04/2015 low risk MV;  b. 12/2016 Cath: minor irregs in LAD/Diag/LCX/OM, RCA 40p/m/d; c. 05/2020 Cath: LM nl, LAD 50d, LCX 30p, OM1 40, RCA 100p w/ L->R collats. CO/CI 3.1/1.3-->Med rx.   Chest wall pain, chronic    Chronic Troponin Elevation    CKD (chronic kidney disease), stage II-III    COPD (chronic obstructive pulmonary disease) (HCC)    Diabetes mellitus without complication ([place])    HFrEF (heart failure with reduced ejection fraction) ([place])    a. 03/2015 Echo: EF 45-50%; b. 12/2015 Echo: EF 20-25%; c. 02/2016 Echo: EF 30-35%; d. 11/2016 Echo: EF 40-45%; e. 06/2019 Echo: EF 30-35%; f. 11/2019 Echo: EF 25-30%; g. 05/2020 Echo: EF 35-40%; h. 05/2021 Echo: EF 40-45%; i. 11/2021 Echo: EF 20-25%, glob HK, mild LVH, GrIII DD, sev red RV fxn, mild MR.    Hypertension    Mitral regurgitation    Mild to moderate by October 2021 echocardiogram.   Mixed Ischemic & NICM (nonischemic cardiomyopathy) ([place])    a. 03/2015 Echo: EF 45-50%; b. 12/2015 Echo: EF 20-25%; c. 02/2016 Echo: EF 30-35%; d. 11/2016 Echo: EF 40-45%; e. 06/2019 Echo: EF 30-35%; f. 11/2019 Echo: EF 25-30%; g. 05/2020 Echo: EF 35-40%; h. 05/2021 Echo: EF 40-45%; i. 11/2021 Echo: EF 20-25%   Myocardial infarct (HCC)    NSVT (nonsustained ventricular tachycardia) ([place])    a. 12/2015 noted on tele-->amio;  b. 12/2015 Event monitor: no VT noted.   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  12/31/19: RUL and RLL PEs.   Syncope    a. 01/2016 - felt to be vasovagal.    Past Surgical History:  Procedure Laterality Date   AMPUTATION     CARDIAC CATHETERIZATION     FINGER AMPUTATION     Traumatic   FINGER FRACTURE SURGERY Left    LEFT HEART CATH AND CORONARY ANGIOGRAPHY N/A 01/06/2017   Procedure: Left Heart Cath and Coronary Angiography;  Surgeon: Wellington Hampshire, MD;  Location: [place] [facility];  Service: Cardiovascular;  Laterality: N/A;   RIGHT/LEFT HEART CATH AND CORONARY ANGIOGRAPHY N/A 06/13/2020   Procedure: RIGHT/LEFT HEART CATH AND CORONARY ANGIOGRAPHY;  Surgeon: Nelva Bush, MD;  Location: [place] [facility];  Service: Cardiovascular;  Laterality: N/A;   SUBQ ICD IMPLANT N/A 05/20/2022   Procedure: SUBQ ICD IMPLANT;  Surgeon: Vickie Epley, MD;  Location: [place] [facility];  Service: Cardiovascular;  Laterality: N/A;     Current Outpatient Medications:    blood glucose meter kit and supplies KIT, Dispense based on patient and insurance preference. Use up to four times daily as directed., Disp: 1 each, Rfl: 11   carvedilol (COREG) 3.125 MG tablet, Take 1 tablet (3.125 mg total) by mouth 2 (two) times daily with a meal., Disp: 60 tablet, Rfl: 3   clobetasol (TEMOVATE) 0.05 % external  solution, Apply 1 Application topically 2 (two) times daily., Disp: 50 mL, Rfl: 1   Continuous Blood Gluc Sensor (FREESTYLE LIBRE 3 SENSOR) MISC, Place 1 sensor on the skin every 14 days. Use to check glucose continuously, Disp: 6 each, Rfl: 3   dapagliflozin propanediol (FARXIGA) 10 MG TABS tablet, Take 1 tablet (10 mg total) by mouth daily., Disp: 30 tablet, Rfl: 5   fenofibrate (TRICOR) 145 MG tablet, Take 1 tablet (145 mg total) by mouth daily., Disp: 90 tablet, Rfl: 3   FLUoxetine (PROZAC) 20 MG capsule, Take 1 capsule (20 mg total) by mouth daily., Disp: 90 capsule, Rfl: 3   Furosemide (FUROSCIX) 80 MG/10ML CTKT, Inject 1 Dose into the skin., Disp: , Rfl:    insulin aspart (NOVOLOG) 100 UNIT/ML FlexPen, Inject 6 units + correction before breakfast/lunch and 9 units + correction before supper (correction = 1 unit per 50 mg/dL above 150); max daily dose 36 units, Disp: 15 mL, Rfl: 3   Insulin Glargine (BASAGLAR KWIKPEN) 100 UNIT/ML, Inject 36 Units into the skin at bedtime., Disp: 30 mL, Rfl: 3   Insulin Pen Needle (PEN NEEDLES) 32G X 4 MM MISC, 200 each by Does not apply route at bedtime., Disp: 200 each, Rfl: 3   Ipratropium-Albuterol (COMBIVENT RESPIMAT) 20-100 MCG/ACT AERS respimat, Inhale 1 puff into the lungs every 6 (six) hours as needed for wheezing., Disp: , Rfl:    meclizine (ANTIVERT) 25 MG tablet, Take 1 tablet (25 mg total) by mouth 3 (three) times daily as needed for dizziness., Disp: 15 tablet, Rfl: 0   Na Sulfate-K Sulfate-Mg Sulf 17.5-3.13-1.6 GM/177ML SOLN, Take 354 mLs by mouth once for 1 dose., Disp: 354 mL, Rfl: 0   nitroGLYCERIN (NITROSTAT) 0.4 MG SL tablet, Place 1 tablet (0.4 mg total) under the tongue every 5 (five) minutes x 3 doses as needed for chest pain., Disp: 30 tablet, Rfl: 0   potassium chloride SA (KLOR-CON M) 20 MEQ tablet, Take 20 mEq by mouth every other day., Disp: , Rfl:    Semaglutide, 1 MG/DOSE, 4 MG/3ML SOPN, Inject 1 mg as directed once a week., Disp: 9  mL, Rfl: 0   spironolactone (ALDACTONE) 25 MG tablet, Take 1 tablet (25 mg total) by mouth daily., Disp: 90 tablet, Rfl: 3   torsemide (DEMADEX) 20 MG tablet, Take 40 mg by mouth daily., Disp: , Rfl:    rivaroxaban (XARELTO) 20 MG TABS tablet, Take 1 tablet (20 mg total) by mouth daily with supper., Disp: 90 tablet, Rfl: 3   rosuvastatin (CRESTOR) 40 MG tablet, Take 1 tablet (40 mg total) by mouth daily., Disp: 90 tablet, Rfl: 3   sacubitril-valsartan (ENTRESTO) 49-51 MG, Take 1 tablet by mouth 2 (two) times daily., Disp: 60 tablet, Rfl: 3   Family History  Problem Relation Age of Onset   Diabetes Mother    Diabetes Mellitus  II Mother    Hypothyroidism Mother    Hypertension Mother    Kidney failure Mother        Dialysis   Heart attack Mother        80 yo approximately   Hypertension Father    Gout Father    Cancer Maternal Grandfather    Diabetes Maternal Grandfather    Cancer Paternal Aunt      Social History   Tobacco Use   Smoking status: Former    Packs/day: 0.50    Years: 33.00    Total pack years: 16.50    Types: Cigarettes    Quit date: 05/08/2020    Years since quitting: 2.1   Smokeless tobacco: Former    Quit date: 05/08/2020   Tobacco comments:    Quit Sept 2021  Vaping Use   Vaping Use: Former  Substance Use Topics   Alcohol use: Not Currently    Comment: occassionally   Drug use: No    Allergies as of 07/18/2022 - Review Complete 07/18/2022  Allergen Reaction Noted   Metformin and related Nausea And Vomiting 11/22/2019   Prednisone Other (See Comments) 03/20/2015   Zantac [ranitidine hcl] Diarrhea and Nausea Only 08/27/2018    Review of Systems:    All systems reviewed and negative except where noted in HPI.   Physical Exam:  BP 131/84 (BP Location: Left Arm, Patient Position: Sitting, Cuff Size: Normal)   Pulse 99   Temp 98.1 F (36.7 C) (Oral)   Ht 6' (1.829 m)   Wt 247 lb (112 kg)   BMI 33.50 kg/m  No LMP for male patient.  General:    Alert,  Well-developed, well-nourished, pleasant and cooperative in NAD Head:  Normocephalic and atraumatic. Eyes:  Sclera clear, no icterus.   Conjunctiva pink. Ears:  Normal auditory acuity. Nose:  No deformity, discharge, or lesions. Mouth:  No deformity or lesions,oropharynx pink & moist. Neck:  Supple; no masses or thyromegaly. Lungs:  Respirations even and unlabored.  Clear throughout to auscultation.   No wheezes, crackles, or rhonchi. No acute distress. Heart:  Regular rate and rhythm; no murmurs, clicks, rubs, or gallops. Abdomen:  Normal bowel sounds. Soft, non-tender and non-distended without masses, hepatosplenomegaly or hernias noted.  No guarding or rebound tenderness.   Rectal: Not performed Msk:  Symmetrical without gross deformities. Good, equal movement & strength bilaterally. Pulses:  Normal pulses noted. Extremities:  No clubbing or edema.  No cyanosis. Neurologic:  Alert and oriented x3;  grossly normal neurologically. Skin:  Intact without significant lesions or rashes. No jaundice. Psych:  Alert and cooperative. Normal mood and affect.  Imaging Studies: Reviewed  Assessment and Plan:   Gabriel [name]. is a 51 y.o. Caucasian male with metabolic syndrome, poorly controlled diabetes, CHF, nonischemic cardiomyopathy, nonobstructive coronary artery disease AICD, CKD is seen in consultation for compensated cirrhosis of liver, symptoms of dyspepsia  Compensated cirrhosis of liver: Secondary to [facility] Right upper quadrant ultrasound revealed nodularity of the liver with severe hepatic steatosis.  No evidence of portal hypertension Recommend EGD for variceal screening Maintain strict low-sodium diet No evidence of liver lesions based on the recent imaging Tight control of diabetes Continue to remain abstinent from alcohol use  Postprandial symptoms of dyspepsia with sulfur burps Likely diabetic gastroparesis Recommend EGD for further evaluation.  If EGD is negative,  recommend gastric emptying study  Colon cancer screening Recommend colonoscopy and patient is agreeable.  Will obtain clearance from cardiology before proceeding  with upper endoscopy and colonoscopy as well as clearance for interruption of Xarelto  Follow up in 4 months   Gabriel Darby, MD

## 2022-07-19 NOTE — Telephone Encounter (Signed)
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    Procedure: colonoscopy and EGD Date of procedure: 08/01/22  CrCl 6m/min using adjusted body weight due to obesity Platelet count 223K  Per office protocol, patient can hold Xarelto for 1 day prior to procedure. He should resume anticoag as soon as safely possible after.  **This guidance is not considered finalized until pre-operative APP has relayed final recommendations.**

## 2022-07-19 NOTE — Telephone Encounter (Signed)
   Name: Gabriel [name].  DOB: [birth year]  MRN: [id number]   Primary Cardiologist: [name], MD  Chart reviewed as part of pre-operative protocol coverage.  Gabriel [name]. was last seen on 06/14/2022 by Dr. Haroldine Laws.    Therefore, based on ACC/AHA guidelines, the patient would be at acceptable risk for the planned procedure without further cardiovascular testing.   Per Pharm D: Procedure: colonoscopy and EGD Date of procedure: 08/01/22   CrCl 67m/min using adjusted body weight due to obesity Platelet count 223K   Per office protocol, patient can hold Xarelto for 1 day prior to procedure. He should resume anticoag as soon as safely possible after.  I will route this recommendation to the requesting party via Epic fax function and remove from pre-op pool. Please call with questions.  DMayra Reel NP 07/19/2022, 9:47 AM

## 2022-07-22 ENCOUNTER — Telehealth: Payer: Self-pay

## 2022-07-22 ENCOUNTER — Other Ambulatory Visit
Admission: RE | Admit: 2022-07-22 | Discharge: 2022-07-22 | Disposition: A | Discharge status: Home / Self Care | Payer: Medicaid Other | Source: Ambulatory Visit | Attending: Internal Medicine | Admitting: Internal Medicine

## 2022-07-22 ENCOUNTER — Ambulatory Visit (HOSPITAL_BASED_OUTPATIENT_CLINIC_OR_DEPARTMENT_OTHER): Payer: Medicaid Other | Admitting: Internal Medicine

## 2022-07-22 ENCOUNTER — Ambulatory Visit: Payer: Medicaid Other | Attending: Otolaryngology

## 2022-07-22 ENCOUNTER — Encounter: Payer: Self-pay | Admitting: Internal Medicine

## 2022-07-22 VITALS — BP 116/78 | HR 85 | Resp 16 | Ht 72.0 in | Wt 244.1 lb

## 2022-07-22 DIAGNOSIS — E669 Obesity, unspecified: Secondary | ICD-10-CM | POA: Diagnosis not present

## 2022-07-22 DIAGNOSIS — I2510 Atherosclerotic heart disease of native coronary artery without angina pectoris: Secondary | ICD-10-CM | POA: Diagnosis not present

## 2022-07-22 DIAGNOSIS — R0683 Snoring: Secondary | ICD-10-CM | POA: Insufficient documentation

## 2022-07-22 DIAGNOSIS — Z95810 Presence of automatic (implantable) cardiac defibrillator: Secondary | ICD-10-CM | POA: Diagnosis not present

## 2022-07-22 DIAGNOSIS — I10 Essential (primary) hypertension: Secondary | ICD-10-CM

## 2022-07-22 DIAGNOSIS — Z79899 Other long term (current) drug therapy: Secondary | ICD-10-CM | POA: Insufficient documentation

## 2022-07-22 DIAGNOSIS — I130 Hypertensive heart and chronic kidney disease with heart failure and stage 1 through stage 4 chronic kidney disease, or unspecified chronic kidney disease: Secondary | ICD-10-CM | POA: Insufficient documentation

## 2022-07-22 DIAGNOSIS — Z794 Long term (current) use of insulin: Secondary | ICD-10-CM | POA: Insufficient documentation

## 2022-07-22 DIAGNOSIS — K7460 Unspecified cirrhosis of liver: Secondary | ICD-10-CM | POA: Diagnosis not present

## 2022-07-22 DIAGNOSIS — I428 Other cardiomyopathies: Secondary | ICD-10-CM | POA: Insufficient documentation

## 2022-07-22 DIAGNOSIS — Z86711 Personal history of pulmonary embolism: Secondary | ICD-10-CM | POA: Insufficient documentation

## 2022-07-22 DIAGNOSIS — N1832 Chronic kidney disease, stage 3b: Secondary | ICD-10-CM

## 2022-07-22 DIAGNOSIS — G4733 Obstructive sleep apnea (adult) (pediatric): Secondary | ICD-10-CM | POA: Diagnosis not present

## 2022-07-22 DIAGNOSIS — Z7984 Long term (current) use of oral hypoglycemic drugs: Secondary | ICD-10-CM | POA: Insufficient documentation

## 2022-07-22 DIAGNOSIS — E1122 Type 2 diabetes mellitus with diabetic chronic kidney disease: Secondary | ICD-10-CM | POA: Diagnosis not present

## 2022-07-22 DIAGNOSIS — I5022 Chronic systolic (congestive) heart failure: Secondary | ICD-10-CM | POA: Insufficient documentation

## 2022-07-22 DIAGNOSIS — N1831 Chronic kidney disease, stage 3a: Secondary | ICD-10-CM | POA: Insufficient documentation

## 2022-07-22 DIAGNOSIS — J449 Chronic obstructive pulmonary disease, unspecified: Secondary | ICD-10-CM | POA: Diagnosis not present

## 2022-07-22 DIAGNOSIS — R0602 Shortness of breath: Secondary | ICD-10-CM | POA: Insufficient documentation

## 2022-07-22 DIAGNOSIS — Z7901 Long term (current) use of anticoagulants: Secondary | ICD-10-CM | POA: Insufficient documentation

## 2022-07-22 LAB — BASIC METABOLIC PANEL
Anion gap: 10 (ref 5–15)
BUN: 34 mg/dL — ABNORMAL HIGH (ref 6–20)
CO2: 24 mmol/L (ref 22–32)
Calcium: 9.5 mg/dL (ref 8.9–10.3)
Chloride: 105 mmol/L (ref 98–111)
Creatinine, Ser: 1.49 mg/dL — ABNORMAL HIGH (ref 0.61–1.24)
GFR, Estimated: 56 mL/min — ABNORMAL LOW (ref >60)
Glucose, Bld: 127 mg/dL — ABNORMAL HIGH (ref 70–99)
Potassium: 3.8 mmol/L (ref 3.5–5.1)
Sodium: 139 mmol/L (ref 135–145)

## 2022-07-22 LAB — BRAIN NATRIURETIC PEPTIDE: B Natriuretic Peptide: 114.8 pg/mL — ABNORMAL HIGH (ref 0.0–100.0)

## 2022-07-22 MED ORDER — FUROSCIX 80 MG/10ML ~~LOC~~ CTKT: 1.0000 | CARTRIDGE | SUBCUTANEOUS | 0 refills | Status: DC | PRN

## 2022-07-22 NOTE — Patient Instructions (Addendum)
Medication Changes:  Take a dose of furoscix if weight is equal or greater than 250 lbs.  Furoscix refilled.   Lab Work:  Labs done today, your results will be available in MyChart, we will contact you for abnormal readings.   Testing/Procedures:  Your physician has requested that you have an echocardiogram. Echocardiography is a painless test that uses sound waves to create images of your heart. It provides your doctor with information about the size and shape of your heart and how well your heart's chambers and valves are working. This procedure takes approximately one hour. There are no restrictions for this procedure. Please do NOT wear cologne, perfume, aftershave, or lotions (deodorant is allowed). Please arrive 15 minutes prior to your appointment time.  Scheduled for 08/14/22 at 10 AM. Please arrive at [place] entrance 15 minutes prior to appointment to get checked in.  Referrals:  None today  Special Instructions // Education:  Do the following things EVERYDAY: Weigh yourself in the morning before breakfast. Write it down and keep it in a log. Take your medicines as prescribed Eat low salt foods--Limit salt (sodium) to 2000 mg per day.  Stay as active as you can everyday Limit all fluids for the day to less than 2 liters   Follow-Up in:  1 month: Amber will call you to schedule closer to appointment time.      If you have any questions or concerns before your next appointment please send [place] a message through [place] or call our office at [phone] Monday-Friday 8 am-5 pm.   If you have an urgent need after hours on the weekend please call your Primary Cardiologist or the [facility] in [place] at [phone].

## 2022-07-22 NOTE — Progress Notes (Signed)
ReDS Vest / Clip - 07/22/22 1000       ReDS Vest / Clip   Station Marker C    Ruler Value 31    ReDS Value Range High volume overload    ReDS Actual Value 51

## 2022-07-22 NOTE — Telephone Encounter (Signed)
Received blood thinner clearance from cardiology about stopping the [facility]. They want patient to stop the Xaretlo 1 day prior to procedure and then can restart it 1 day after procedure. Informed patient and patient verbalized understanding of instructions

## 2022-07-22 NOTE — Progress Notes (Signed)
[facility] HF CLINIC CONSULT NOTE  Referring Physician: [name], MD Primary Care: Jolaine Artist, MD Primary Cardiologist: [name], MD   HPI:  Gabriel [name] is a 51 y/o male w/ a history including CAD, chronic chest pain, chronic troponin elevation, HFrEF, mixed ischemic and nonischemic cardiomyopathy, nonsustained VT, stage IIIa chronic kidney disease, COPD, unprovoked PE on chronic anticoagulation, type 2 diabetes mellitus, and obesity.  Referred by [name] for further management of his HF.  10/21 admitted with chest pain and elevated troponin. Cath with chronic total occlusion of the RCA with left-to-right collaterals.  He otherwise had moderate, nonobstructive disease including a 50% distal stenosis in the LAD, 30% proximal stenosis in the left circumflex, and a 40% OM1 lesion.  He was medically managed. Follow-up echo in October 2022, in the setting of recurrent hospitalization for HF, showed EF 40-45%.    Readmitted in 2/23 and 4/23 with HF.  He responded well to IV diuresis and at his request, he was discharged home on April 12 torsemide 40 mg twice daily (previous outpatient dose of 60 mg daily).   He presented to the ED 6/1 with blood glucose elevated @ 534.  Troponin stable at 50.  Other notable labs: K 2.7, Creat up from baseline @ 2.42.  He was treated w/ insulin, IVF, and KCl.    Last echo 02/06/22: EF 30-35%   Underwent SQ ICD placement 05/20/22  Has been following with PCP and A1c down to 7.5 in 9/23  Saw GI and planning EGD/colon. Has h/o cirrhosis from NASH  Here for routine f/u. At last visit we increased Entresto to 49/51. Says weight was up from 240 to 250. Increased torsemide from 40 daily to 60 daily. Also used a Furoscix. Some days gets good output and sometimes doesn't. Was previously on torsemide 40 bid but felt that was too much. Can do ADLs as long as he goes slowly. Going to CR. Saw GI and planning for EGD/colonscopy      Past Medical  History:  Diagnosis Date   CAD (coronary artery disease)    a. 04/2015 low risk MV;  b. 12/2016 Cath: minor irregs in LAD/Diag/LCX/OM, RCA 40p/m/d; c. 05/2020 Cath: LM nl, LAD 50d, LCX 30p, OM1 40, RCA 100p w/ L->R collats. CO/CI 3.1/1.3-->Med rx.   Chest wall pain, chronic    Chronic Troponin Elevation    CKD (chronic kidney disease), stage II-III    COPD (chronic obstructive pulmonary disease) (HCC)    Diabetes mellitus without complication ([place])    HFrEF (heart failure with reduced ejection fraction) ([place])    a. 03/2015 Echo: EF 45-50%; b. 12/2015 Echo: EF 20-25%; c. 02/2016 Echo: EF 30-35%; d. 11/2016 Echo: EF 40-45%; e. 06/2019 Echo: EF 30-35%; f. 11/2019 Echo: EF 25-30%; g. 05/2020 Echo: EF 35-40%; h. 05/2021 Echo: EF 40-45%; i. 11/2021 Echo: EF 20-25%, glob HK, mild LVH, GrIII DD, sev red RV fxn, mild MR.   Hypertension    Mitral regurgitation    Mild to moderate by October 2021 echocardiogram.   Mixed Ischemic & NICM (nonischemic cardiomyopathy) ([place])    a. 03/2015 Echo: EF 45-50%; b. 12/2015 Echo: EF 20-25%; c. 02/2016 Echo: EF 30-35%; d. 11/2016 Echo: EF 40-45%; e. 06/2019 Echo: EF 30-35%; f. 11/2019 Echo: EF 25-30%; g. 05/2020 Echo: EF 35-40%; h. 05/2021 Echo: EF 40-45%; i. 11/2021 Echo: EF 20-25%   Myocardial infarct (HCC)    NSVT (nonsustained ventricular tachycardia) ([place])    a. 12/2015 noted on tele-->amio;  b. 12/2015 Event monitor: no VT noted.   Obesity (BMI 30.0-34.9)    Psoriasis    Recurrent pulmonary emboli ([place]) 06/07/2020   06/07/20: small bilateral PEs.  12/31/19: RUL and RLL PEs.   Syncope    a. 01/2016 - felt to be vasovagal.    Current Outpatient Medications  Medication Sig Dispense Refill   blood glucose meter kit and supplies KIT Dispense based on patient and insurance preference. Use up to four times daily as directed. 1 each 11   carvedilol (COREG) 3.125 MG tablet Take 1 tablet (3.125 mg total) by mouth 2 (two) times daily with a meal. 60 tablet 3   clobetasol (TEMOVATE)  0.05 % external solution Apply 1 Application topically 2 (two) times daily. 50 mL 1   Continuous Blood Gluc Sensor (FREESTYLE LIBRE 3 SENSOR) MISC Place 1 sensor on the skin every 14 days. Use to check glucose continuously 6 each 3   dapagliflozin propanediol (FARXIGA) 10 MG TABS tablet Take 1 tablet (10 mg total) by mouth daily. 30 tablet 5   fenofibrate (TRICOR) 145 MG tablet Take 1 tablet (145 mg total) by mouth daily. 90 tablet 3   FLUoxetine (PROZAC) 20 MG capsule Take 1 capsule (20 mg total) by mouth daily. 90 capsule 3   Furosemide (FUROSCIX) 80 MG/10ML CTKT Inject 1 Dose into the skin as needed (edema).     insulin aspart (NOVOLOG) 100 UNIT/ML FlexPen Inject 6 units + correction before breakfast/lunch and 9 units + correction before supper (correction = 1 unit per 50 mg/dL above 150); max daily dose 36 units 15 mL 3   Insulin Glargine (BASAGLAR KWIKPEN) 100 UNIT/ML Inject 36 Units into the skin at bedtime. 30 mL 3   Insulin Pen Needle (PEN NEEDLES) 32G X 4 MM MISC 200 each by Does not apply route at bedtime. 200 each 3   Ipratropium-Albuterol (COMBIVENT RESPIMAT) 20-100 MCG/ACT AERS respimat Inhale 1 puff into the lungs every 6 (six) hours as needed for wheezing.     meclizine (ANTIVERT) 25 MG tablet Take 1 tablet (25 mg total) by mouth 3 (three) times daily as needed for dizziness. 15 tablet 0   nitroGLYCERIN (NITROSTAT) 0.4 MG SL tablet Place 1 tablet (0.4 mg total) under the tongue every 5 (five) minutes x 3 doses as needed for chest pain. 30 tablet 0   potassium chloride SA (KLOR-CON M) 20 MEQ tablet Take 20 mEq by mouth every other day.     rivaroxaban (XARELTO) 20 MG TABS tablet Take 1 tablet (20 mg total) by mouth daily with supper. 90 tablet 3   rosuvastatin (CRESTOR) 40 MG tablet Take 1 tablet (40 mg total) by mouth daily. 90 tablet 3   sacubitril-valsartan (ENTRESTO) 49-51 MG Take 1 tablet by mouth 2 (two) times daily. 60 tablet 3   Semaglutide, 1 MG/DOSE, 4 MG/3ML SOPN Inject 1 mg  as directed once a week. 9 mL 0   spironolactone (ALDACTONE) 25 MG tablet Take 1 tablet (25 mg total) by mouth daily. 90 tablet 3   torsemide (DEMADEX) 20 MG tablet Take 40 mg by mouth daily. Take an extra 20 mg tablet daily as needed for edema     No current facility-administered medications for this visit.    Allergies  Allergen Reactions   Metformin And Related Nausea And Vomiting   Prednisone Other (See Comments)    Reaction: Hallucinations     Zantac [Ranitidine Hcl] Diarrhea and Nausea Only    Night sweats  Social History   Socioeconomic History   Marital status: Widowed    Spouse name: Not on file   Number of children: 1   Years of education: 35   Highest education level: 12th grade  Occupational History   Occupation: disabled  Tobacco Use   Smoking status: Former    Packs/day: 0.50    Years: 33.00    Total pack years: 16.50    Types: Cigarettes    Quit date: 05/08/2020    Years since quitting: 2.2   Smokeless tobacco: Former    Quit date: 05/08/2020   Tobacco comments:    Quit Sept 2021  Vaping Use   Vaping Use: Former  Substance and Sexual Activity   Alcohol use: Not Currently    Comment: occassionally   Drug use: No   Sexual activity: Not Currently  Other Topics Concern   Not on file  Social History Narrative   Lives at home with wife and his dad's wife.        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    Social Determinants of Health   Financial Resource Strain: High Risk (04/23/2022)   Overall Financial Resource Strain (CARDIA)    Difficulty of Paying Living Expenses: Hard  Food Insecurity: Food Insecurity Present (05/14/2022)   Hunger Vital Sign    Worried About Running Out of Food in the Last Year: Often true    Ran Out of Food in the Last Year: Sometimes true  Transportation Needs: No Transportation Needs (05/14/2022)   PRAPARE - Hydrologist  (Medical): No    Lack of Transportation (Non-Medical): No  Physical Activity: Insufficiently Active (01/20/2018)   Exercise Vital Sign    Days of Exercise per Week: 7 days    Minutes of Exercise per Session: 10 min  Stress: Stress Concern Present (09/02/2017)   [place]    Feeling of Stress : Very much  Social Connections: Moderately Isolated (09/02/2017)   Social Connection and Isolation Panel [NHANES]    Frequency of Communication with Friends and Family: Twice a week    Frequency of Social Gatherings with Friends and Family: Once a week    Attends Religious Services: Never    Marine scientist or Organizations: No    Attends Archivist Meetings: Never    Marital Status: Widowed  Intimate Partner Violence: Not At Risk (05/14/2022)   Humiliation, Afraid, Rape, and Kick questionnaire    Fear of Current or Ex-Partner: No    Emotionally Abused: No    Physically Abused: No    Sexually Abused: No      Family History  Problem Relation Age of Onset   Diabetes Mother    Diabetes Mellitus II Mother    Hypothyroidism Mother    Hypertension Mother    Kidney failure Mother        Dialysis   Heart attack Mother        7 yo approximately   Hypertension Father    Gout Father    Cancer Maternal Grandfather    Diabetes Maternal Grandfather    Cancer Paternal Aunt     Vitals:   07/22/22 1000  BP: 116/78  Pulse: 85  Resp: 16  SpO2: 95%  Weight: 244 lb 2 oz (110.7 kg)  Height: 6' (1.829 m)   Wt Readings from Last 3 Encounters:  07/22/22 244  lb 2 oz (110.7 kg)  07/18/22 247 lb (112 kg)  07/05/22 240 lb 6.4 oz (109 kg)     PHYSICAL EXAM: General:  Well appearing. No resp difficulty HEENT: normal Neck: supple. no JVD. Carotids 2+ bilat; no bruits. No lymphadenopathy or thryomegaly appreciated. Cor: PMI nondisplaced. Regular rate & rhythm. No rubs, gallops or murmurs. Lungs: clear Abdomen: obese  soft, nontender, nondistended. No hepatosplenomegaly. No bruits or masses. Good bowel sounds. Extremities: no cyanosis, clubbing, rash, edema Neuro: alert & orientedx3, cranial nerves grossly intact. moves all 4 extremities w/o difficulty. Affect pleasant   ECG: NSR 81 No ST-T wave abnormalities. Personally reviewed   ASSESSMENT & PLAN:   1.  Chronic HFrEF/Mixed Ischemic and nonischemic cardiomyopathy:   - Mixed ICM/NICM - 10/22 where EF was 40-45%.  - Last echo 02/06/22: EF 30-35%  - NYHA III - s/p [place] Sci Sq-ICD  - Volume status looks good. ReDS 51% (probably falsely elevated) - Continue torsemide 60 daily. Can use Furoscix if weight >= 250  - Continue Entresto to 49/51 bid - Continue Farxiga 10 - Continue spiro 25 daily - Continue carvedilol 3.125 bid - Consider Cardiomems to help volume management as needed -> his insurance won't cover - Continue CR - See back in 1 month with repeat echo  - Labs today  2.  CAD/Elevated Troponin and chronic CP:  - H/o CAD w/ last cath in 10/201 revealing an occluded RCA w/ otw nonobs dzs and L  R collats.  - Continue ASA/statin - No s/s angina   3.  HTN:  - Blood pressure well controlled. Continue current regimen.  4.  H/o PE/Chronic anticoagulation:  -On Xarelto.    5.  CKD IIIa  - check labs today - continue SGLT2i/Entresto   6. DM2 -  per PCP - glucose control much improved. Check A1c today  7. OSA - not on CPAP due to lack of insurance. Now on Medicaid - Sleep study 3/22: AHI 28  - will repeat sleep study (pending)  Glori Bickers, MD  10:51 AM

## 2022-07-23 ENCOUNTER — Encounter: Payer: Medicaid Other | Attending: Internal Medicine | Admitting: *Deleted

## 2022-07-23 DIAGNOSIS — I5022 Chronic systolic (congestive) heart failure: Secondary | ICD-10-CM | POA: Diagnosis not present

## 2022-07-23 DIAGNOSIS — Z4889 Encounter for other specified surgical aftercare: Secondary | ICD-10-CM | POA: Diagnosis not present

## 2022-07-23 LAB — GLUCOSE, CAPILLARY
Glucose-Capillary: 119 mg/dL — ABNORMAL HIGH (ref 70–99)
Glucose-Capillary: 123 mg/dL — ABNORMAL HIGH (ref 70–99)

## 2022-07-23 LAB — HEMOGLOBIN A1C
Hgb A1c MFr Bld: 7.1 % — ABNORMAL HIGH (ref 4.8–5.6)
Mean Plasma Glucose: 157 mg/dL

## 2022-07-23 NOTE — Progress Notes (Signed)
Daily Session Note  Patient Details  Name: Gabriel [name]. MRN: [id number] Date of Birth: 28-Dec-1970 Referring Provider:   Flowsheet Row Cardiac Rehab from 07/01/2022 in [facility] Cardiac and Pulmonary Rehab  Referring Provider Bensimhon       Encounter Date: 07/23/2022  Check In:  Session Check In - 07/23/22 1113       Check-In   Supervising physician immediately available to respond to emergencies See telemetry face sheet for immediately available ER MD    Location [facility]-Cardiac & Pulmonary Rehab    Staff Present Renita Papa, RN BSN;Jessica Luan Pulling, MA, RCEP, CCRP, CCET;Noah Tickle, BS, Exercise Physiologist    Virtual Visit No    Medication changes reported     No    Fall or balance concerns reported    No    Warm-up and Cool-down Performed on first and last piece of equipment    Resistance Training Performed Yes    VAD Patient? No    PAD/SET Patient? No      Pain Assessment   Currently in Pain? No/denies                Social History   Tobacco Use  Smoking Status Former   Packs/day: 0.50   Years: 33.00   Total pack years: 16.50   Types: Cigarettes   Quit date: 05/08/2020   Years since quitting: 2.2  Smokeless Tobacco Former   Quit date: 05/08/2020  Tobacco Comments   Quit Sept 2021    Goals Met:  Independence with exercise equipment Exercise tolerated well No report of concerns or symptoms today Strength training completed today  Goals Unmet:  Not Applicable  Comments: Pt able to follow exercise prescription today without complaint.  Will continue to monitor for progression.    Dr. Emily Filbert is Medical Director for [place].  Dr. Ottie Glazier is Medical Director for [facility] Pulmonary Rehabilitation.

## 2022-07-23 NOTE — Telephone Encounter (Signed)
Patient called back in checking on the status of his requested info on the Ozempic prescription. Please assist patient further.

## 2022-07-24 NOTE — Telephone Encounter (Signed)
Spoke with patient, advised it has been ready at the pharmacy for a week.

## 2022-07-25 ENCOUNTER — Encounter: Payer: Medicaid Other | Admitting: *Deleted

## 2022-07-25 ENCOUNTER — Encounter: Payer: Self-pay | Admitting: Emergency Medicine

## 2022-07-25 ENCOUNTER — Ambulatory Visit: Payer: Medicaid Other | Admitting: Family

## 2022-07-25 ENCOUNTER — Emergency Department
Admission: EM | Admit: 2022-07-25 | Discharge: 2022-07-25 | Discharge status: Against Medical Advice | Payer: Medicaid Other | Attending: Emergency Medicine | Admitting: Emergency Medicine

## 2022-07-25 ENCOUNTER — Other Ambulatory Visit: Payer: Self-pay

## 2022-07-25 DIAGNOSIS — R42 Dizziness and giddiness: Secondary | ICD-10-CM | POA: Insufficient documentation

## 2022-07-25 DIAGNOSIS — R531 Weakness: Secondary | ICD-10-CM | POA: Diagnosis not present

## 2022-07-25 DIAGNOSIS — R110 Nausea: Secondary | ICD-10-CM | POA: Insufficient documentation

## 2022-07-25 DIAGNOSIS — I5022 Chronic systolic (congestive) heart failure: Secondary | ICD-10-CM

## 2022-07-25 LAB — BASIC METABOLIC PANEL
Anion gap: 9 (ref 5–15)
BUN: 45 mg/dL — ABNORMAL HIGH (ref 6–20)
CO2: 26 mmol/L (ref 22–32)
Calcium: 9.6 mg/dL (ref 8.9–10.3)
Chloride: 102 mmol/L (ref 98–111)
Creatinine, Ser: 1.88 mg/dL — ABNORMAL HIGH (ref 0.61–1.24)
GFR, Estimated: 42 mL/min — ABNORMAL LOW (ref >60)
Glucose, Bld: 127 mg/dL — ABNORMAL HIGH (ref 70–99)
Potassium: 4.4 mmol/L (ref 3.5–5.1)
Sodium: 137 mmol/L (ref 135–145)

## 2022-07-25 LAB — CBC
HCT: 52.6 % — ABNORMAL HIGH (ref 39.0–52.0)
Hemoglobin: 17.2 g/dL — ABNORMAL HIGH (ref 13.0–17.0)
MCH: 28 pg (ref 26.0–34.0)
MCHC: 32.7 g/dL (ref 30.0–36.0)
MCV: 85.5 fL (ref 80.0–100.0)
Platelets: 209 10*3/uL (ref 150–400)
RBC: 6.15 MIL/uL — ABNORMAL HIGH (ref 4.22–5.81)
RDW: 13.7 % (ref 11.5–15.5)
WBC: 8.1 10*3/uL (ref 4.0–10.5)
nRBC: 0 % (ref 0.0–0.2)

## 2022-07-25 NOTE — ED Notes (Signed)
First Nurse Note: Pt to ED from cardiac rehab for dizziness and weakness. Pt is in NAD at this time.

## 2022-07-25 NOTE — Progress Notes (Signed)
Incomplete Session   Gabriel [name] attended education today. He was unable to stay for exercise due to feeling dizzy and short of breath. He states sometimes this happens and he just needs to rest, but this time rest was not helping. His BP was 112/68 and CBG in the 140s which is "normal" for him. Staff transported him to the Emergency Department to be evaluated.

## 2022-07-25 NOTE — ED Triage Notes (Signed)
Pt via POV from home. Pt c/o weakness and dizziness since yesterday. States that this has happened before and it was vertigo. Denies pain. Pt does endorse nausea. Worse with head movement. Pt is A&OX4 and NAD

## 2022-07-30 ENCOUNTER — Encounter: Payer: Medicaid Other | Admitting: *Deleted

## 2022-07-30 DIAGNOSIS — I5022 Chronic systolic (congestive) heart failure: Secondary | ICD-10-CM

## 2022-07-30 DIAGNOSIS — Z4889 Encounter for other specified surgical aftercare: Secondary | ICD-10-CM | POA: Diagnosis not present

## 2022-07-30 NOTE — Progress Notes (Signed)
Daily Session Note  Patient Details  Name: Gabriel [name]. MRN: [id number] Date of Birth: Apr 23, 1971 Referring Provider:   Flowsheet Row Cardiac Rehab from 07/01/2022 in [facility] Cardiac and Pulmonary Rehab  Referring Provider Bensimhon       Encounter Date: 07/30/2022  Check In:  Session Check In - 07/30/22 0958       Check-In   Supervising physician immediately available to respond to emergencies See telemetry face sheet for immediately available ER MD    Location [facility]-Cardiac & Pulmonary Rehab    Staff Present Renita Papa, RN BSN;Jessica Luan Pulling, MA, RCEP, CCRP, Bertram Gala, MS, ACSM CEP, Exercise Physiologist    Virtual Visit No    Medication changes reported     No    Fall or balance concerns reported    No    Warm-up and Cool-down Performed on first and last piece of equipment    Resistance Training Performed Yes    VAD Patient? No    PAD/SET Patient? No      Pain Assessment   Currently in Pain? No/denies                Social History   Tobacco Use  Smoking Status Former   Packs/day: 0.50   Years: 33.00   Total pack years: 16.50   Types: Cigarettes   Quit date: 05/08/2020   Years since quitting: 2.2  Smokeless Tobacco Former   Quit date: 05/08/2020  Tobacco Comments   Quit Sept 2021    Goals Met:  Independence with exercise equipment Exercise tolerated well No report of concerns or symptoms today Strength training completed today  Goals Unmet:  Not Applicable  Comments: Pt able to follow exercise prescription today without complaint.  Will continue to monitor for progression.  Reviewed home exercise with pt today.  Pt plans to walk and use weights at home for exercise.  . We also talked about using our staff videos as well. Reviewed THR, pulse, RPE, sign and symptoms, pulse oximetery and when to call 911 or MD.  Also discussed weather considerations and indoor options.  Pt voiced understanding.   Dr. Emily Filbert is Medical  Director for [place].  Dr. Ottie Glazier is Medical Director for [facility] Pulmonary Rehabilitation.

## 2022-07-31 ENCOUNTER — Encounter: Payer: Self-pay | Admitting: Gastroenterology

## 2022-08-01 ENCOUNTER — Encounter
Admission: RE | Disposition: A | Discharge status: Home / Self Care | Payer: Self-pay | Source: Home / Self Care | Attending: Gastroenterology

## 2022-08-01 ENCOUNTER — Ambulatory Visit
Admission: RE | Admit: 2022-08-01 | Discharge: 2022-08-01 | Disposition: A | Discharge status: Home / Self Care | Payer: Medicaid Other | Attending: Gastroenterology | Admitting: Gastroenterology

## 2022-08-01 ENCOUNTER — Ambulatory Visit: Payer: Medicaid Other | Admitting: Anesthesiology

## 2022-08-01 DIAGNOSIS — Z87891 Personal history of nicotine dependence: Secondary | ICD-10-CM | POA: Insufficient documentation

## 2022-08-01 DIAGNOSIS — K7460 Unspecified cirrhosis of liver: Secondary | ICD-10-CM | POA: Insufficient documentation

## 2022-08-01 DIAGNOSIS — K259 Gastric ulcer, unspecified as acute or chronic, without hemorrhage or perforation: Secondary | ICD-10-CM | POA: Insufficient documentation

## 2022-08-01 DIAGNOSIS — F32A Depression, unspecified: Secondary | ICD-10-CM | POA: Diagnosis not present

## 2022-08-01 DIAGNOSIS — D126 Benign neoplasm of colon, unspecified: Secondary | ICD-10-CM | POA: Diagnosis not present

## 2022-08-01 DIAGNOSIS — I130 Hypertensive heart and chronic kidney disease with heart failure and stage 1 through stage 4 chronic kidney disease, or unspecified chronic kidney disease: Secondary | ICD-10-CM | POA: Diagnosis not present

## 2022-08-01 DIAGNOSIS — K3189 Other diseases of stomach and duodenum: Secondary | ICD-10-CM | POA: Insufficient documentation

## 2022-08-01 DIAGNOSIS — Z1211 Encounter for screening for malignant neoplasm of colon: Secondary | ICD-10-CM | POA: Diagnosis not present

## 2022-08-01 DIAGNOSIS — Z95810 Presence of automatic (implantable) cardiac defibrillator: Secondary | ICD-10-CM | POA: Insufficient documentation

## 2022-08-01 DIAGNOSIS — Z79899 Other long term (current) drug therapy: Secondary | ICD-10-CM | POA: Insufficient documentation

## 2022-08-01 DIAGNOSIS — K635 Polyp of colon: Secondary | ICD-10-CM | POA: Diagnosis not present

## 2022-08-01 DIAGNOSIS — E1122 Type 2 diabetes mellitus with diabetic chronic kidney disease: Secondary | ICD-10-CM | POA: Diagnosis not present

## 2022-08-01 DIAGNOSIS — I340 Nonrheumatic mitral (valve) insufficiency: Secondary | ICD-10-CM | POA: Diagnosis not present

## 2022-08-01 DIAGNOSIS — Z6834 Body mass index (BMI) 34.0-34.9, adult: Secondary | ICD-10-CM | POA: Diagnosis not present

## 2022-08-01 DIAGNOSIS — D124 Benign neoplasm of descending colon: Secondary | ICD-10-CM | POA: Diagnosis not present

## 2022-08-01 DIAGNOSIS — R1013 Epigastric pain: Secondary | ICD-10-CM | POA: Diagnosis not present

## 2022-08-01 DIAGNOSIS — T7840XA Allergy, unspecified, initial encounter: Secondary | ICD-10-CM | POA: Diagnosis not present

## 2022-08-01 DIAGNOSIS — N183 Chronic kidney disease, stage 3 unspecified: Secondary | ICD-10-CM | POA: Insufficient documentation

## 2022-08-01 DIAGNOSIS — I252 Old myocardial infarction: Secondary | ICD-10-CM | POA: Diagnosis not present

## 2022-08-01 DIAGNOSIS — Z7984 Long term (current) use of oral hypoglycemic drugs: Secondary | ICD-10-CM | POA: Diagnosis not present

## 2022-08-01 DIAGNOSIS — J449 Chronic obstructive pulmonary disease, unspecified: Secondary | ICD-10-CM | POA: Insufficient documentation

## 2022-08-01 DIAGNOSIS — E669 Obesity, unspecified: Secondary | ICD-10-CM | POA: Diagnosis not present

## 2022-08-01 DIAGNOSIS — I5022 Chronic systolic (congestive) heart failure: Secondary | ICD-10-CM | POA: Diagnosis not present

## 2022-08-01 DIAGNOSIS — I2510 Atherosclerotic heart disease of native coronary artery without angina pectoris: Secondary | ICD-10-CM | POA: Diagnosis not present

## 2022-08-01 HISTORY — PX: ESOPHAGOGASTRODUODENOSCOPY (EGD) WITH PROPOFOL: SHX5813

## 2022-08-01 HISTORY — PX: COLONOSCOPY WITH PROPOFOL: SHX5780

## 2022-08-01 LAB — GLUCOSE, CAPILLARY: Glucose-Capillary: 109 mg/dL — ABNORMAL HIGH (ref 70–99)

## 2022-08-01 SURGERY — COLONOSCOPY WITH PROPOFOL
Anesthesia: General

## 2022-08-01 MED ORDER — LIDOCAINE HCL (CARDIAC) PF 100 MG/5ML IV SOSY
PREFILLED_SYRINGE | INTRAVENOUS | Status: DC | PRN
  Administered 2022-08-01: 50 mg via INTRAVENOUS

## 2022-08-01 MED ORDER — PROPOFOL 500 MG/50ML IV EMUL
INTRAVENOUS | Status: DC | PRN
  Administered 2022-08-01: 75 ug/kg/min via INTRAVENOUS

## 2022-08-01 MED ORDER — MIDAZOLAM HCL 2 MG/2ML IJ SOLN
INTRAMUSCULAR | Status: AC
  Filled 2022-08-01: qty 2

## 2022-08-01 MED ORDER — MIDAZOLAM HCL 2 MG/2ML IJ SOLN
INTRAMUSCULAR | Status: DC | PRN
  Administered 2022-08-01: 2 mg via INTRAVENOUS

## 2022-08-01 MED ORDER — PROPOFOL 10 MG/ML IV BOLUS
INTRAVENOUS | Status: DC | PRN
  Administered 2022-08-01: 30 mg via INTRAVENOUS

## 2022-08-01 MED ORDER — OMEPRAZOLE 40 MG PO CPDR: DELAYED_RELEASE_CAPSULE | ORAL | 0 refills | Status: DC

## 2022-08-01 MED ORDER — SODIUM CHLORIDE 0.9 % IV SOLN: INTRAVENOUS | Status: DC

## 2022-08-01 MED ORDER — FUROSEMIDE 10 MG/ML IJ SOLN
INTRAMUSCULAR | Status: AC
  Filled 2022-08-01: qty 4

## 2022-08-01 MED ORDER — FUROSEMIDE 10 MG/ML IJ SOLN
INTRAMUSCULAR | Status: DC | PRN
  Administered 2022-08-01: 20 mg via INTRAMUSCULAR

## 2022-08-01 NOTE — H&P (Signed)
Gabriel Darby, MD [address]  [place]  [place], [place] [address]  Main: [phone]  Fax: [phone] Pager: [phone]  Primary Care Physician:  [name], FNP Primary Gastroenterologist:  Dr. Cephas [name]  Pre-Procedure History & Physical: HPI:  Gabriel [name]. is a 51 y.o. male is here for an endoscopy and colonoscopy.   Past Medical History:  Diagnosis Date   CAD (coronary artery disease)    a. 04/2015 low risk MV;  b. 12/2016 Cath: minor irregs in LAD/Diag/LCX/OM, RCA 40p/m/d; c. 05/2020 Cath: LM nl, LAD 50d, LCX 30p, OM1 40, RCA 100p w/ L->R collats. CO/CI 3.1/1.3-->Med rx.   Chest wall pain, chronic    Chronic Troponin Elevation    CKD (chronic kidney disease), stage II-III    COPD (chronic obstructive pulmonary disease) (HCC)    Diabetes mellitus without complication ([place])    HFrEF (heart failure with reduced ejection fraction) ([place])    a. 03/2015 Echo: EF 45-50%; b. 12/2015 Echo: EF 20-25%; c. 02/2016 Echo: EF 30-35%; d. 11/2016 Echo: EF 40-45%; e. 06/2019 Echo: EF 30-35%; f. 11/2019 Echo: EF 25-30%; g. 05/2020 Echo: EF 35-40%; h. 05/2021 Echo: EF 40-45%; i. 11/2021 Echo: EF 20-25%, glob HK, mild LVH, GrIII DD, sev red RV fxn, mild MR.   Hypertension    Mitral regurgitation    Mild to moderate by October 2021 echocardiogram.   Mixed Ischemic & NICM (nonischemic cardiomyopathy) ([place])    a. 03/2015 Echo: EF 45-50%; b. 12/2015 Echo: EF 20-25%; c. 02/2016 Echo: EF 30-35%; d. 11/2016 Echo: EF 40-45%; e. 06/2019 Echo: EF 30-35%; f. 11/2019 Echo: EF 25-30%; g. 05/2020 Echo: EF 35-40%; h. 05/2021 Echo: EF 40-45%; i. 11/2021 Echo: EF 20-25%   Myocardial infarct (HCC)    NSVT (nonsustained ventricular tachycardia) ([place])    a. 12/2015 noted on tele-->amio;  b. 12/2015 Event monitor: no VT noted.   Obesity (BMI 30.0-34.9)    Psoriasis    Recurrent pulmonary emboli ([place]) 06/07/2020   06/07/20: small bilateral PEs.  12/31/19: RUL and RLL PEs.   Syncope    a. 01/2016 - felt  to be vasovagal.    Past Surgical History:  Procedure Laterality Date   AMPUTATION     CARDIAC CATHETERIZATION     FINGER AMPUTATION     Traumatic   FINGER FRACTURE SURGERY Left    FRACTURE SURGERY     LEFT HEART CATH AND CORONARY ANGIOGRAPHY N/A 01/06/2017   Procedure: Left Heart Cath and Coronary Angiography;  Surgeon: Wellington Hampshire, MD;  Location: [facility];  Service: Cardiovascular;  Laterality: N/A;   RIGHT/LEFT HEART CATH AND CORONARY ANGIOGRAPHY N/A 06/13/2020   Procedure: RIGHT/LEFT HEART CATH AND CORONARY ANGIOGRAPHY;  Surgeon: Nelva Bush, MD;  Location: [facility];  Service: Cardiovascular;  Laterality: N/A;   SUBQ ICD IMPLANT N/A 05/20/2022   Procedure: SUBQ ICD IMPLANT;  Surgeon: Vickie Epley, MD;  Location: [facility];  Service: Cardiovascular;  Laterality: N/A;    Prior to Admission medications   Medication Sig Start Date End Date Taking? Authorizing Provider  carvedilol (COREG) 3.125 MG tablet Take 1 tablet (3.125 mg total) by mouth 2 (two) times daily with a meal. 02/14/22  Yes Theora Gianotti, NP  dapagliflozin propanediol (FARXIGA) 10 MG TABS tablet Take 1 tablet (10 mg total) by mouth daily. 05/09/22  Yes Hackney, Otila Kluver A, FNP  FLUoxetine (PROZAC) 20 MG capsule Take 1 capsule (20 mg total) by mouth  daily. 02/08/22  Yes Tally Joe T, FNP  spironolactone (ALDACTONE) 25 MG tablet Take 1 tablet (25 mg total) by mouth daily. 04/25/22  Yes Alisa Graff, FNP  blood glucose meter kit and supplies KIT Dispense based on patient and insurance preference. Use up to four times daily as directed. 07/08/22   [name], FNP  clobetasol (TEMOVATE) 0.05 % external solution Apply 1 Application topically 2 (two) times daily. 05/31/22   [name], FNP  Continuous Blood Gluc Sensor (FREESTYLE LIBRE 3 SENSOR) MISC Place 1 sensor on the skin every 14 days. Use to check glucose continuously 05/30/22   Virginia Crews, MD   fenofibrate (TRICOR) 145 MG tablet Take 1 tablet (145 mg total) by mouth daily. 01/15/22   Alisa Graff, FNP  Furosemide (FUROSCIX) 80 MG/10ML CTKT Inject 1 Dose into the skin as needed (edema). 07/22/22   Bensimhon, Shaune Pascal, MD  insulin aspart (NOVOLOG) 100 UNIT/ML FlexPen Inject 6 units + correction before breakfast/lunch and 9 units + correction before supper (correction = 1 unit per 50 mg/dL above 150); max daily dose 36 units 07/09/22   [name], FNP  Insulin Glargine (BASAGLAR KWIKPEN) 100 UNIT/ML Inject 36 Units into the skin at bedtime. 01/19/22 02/09/23  Swayze, Ava, DO  Insulin Pen Needle (PEN NEEDLES) 32G X 4 MM MISC 200 each by Does not apply route at bedtime. 05/09/22   [name], FNP  Ipratropium-Albuterol (COMBIVENT RESPIMAT) 20-100 MCG/ACT AERS respimat Inhale 1 puff into the lungs every 6 (six) hours as needed for wheezing.    [provider]  meclizine (ANTIVERT) 25 MG tablet Take 1 tablet (25 mg total) by mouth 3 (three) times daily as needed for dizziness. 05/15/22   Patrecia Pour, MD  nitroGLYCERIN (NITROSTAT) 0.4 MG SL tablet Place 1 tablet (0.4 mg total) under the tongue every 5 (five) minutes x 3 doses as needed for chest pain. 06/09/20   Lorella Nimrod, MD  potassium chloride SA (KLOR-CON M) 20 MEQ tablet Take 20 mEq by mouth every other day.    [provider]  rivaroxaban (XARELTO) 20 MG TABS tablet Take 1 tablet (20 mg total) by mouth daily with supper. 05/26/22   Shirley Friar, PA-C  rosuvastatin (CRESTOR) 40 MG tablet Take 1 tablet (40 mg total) by mouth daily. 04/01/22   Minna Merritts, MD  sacubitril-valsartan (ENTRESTO) 49-51 MG Take 1 tablet by mouth 2 (two) times daily. 06/14/22   Bensimhon, Shaune Pascal, MD  Semaglutide, 1 MG/DOSE, 4 MG/3ML SOPN Inject 1 mg as directed once a week. 07/09/22   [name], FNP  torsemide (DEMADEX) 20 MG tablet Take 40 mg by mouth daily. Take an extra 20 mg tablet daily as needed for edema     [provider]  insulin lispro (HUMALOG) 100 UNIT/ML KwikPen Inject 3 units before breakfast and lunch. 5 units before dinner. 01/19/22 01/21/22  Swayze, Ava, DO    Allergies as of 07/19/2022 - Review Complete 07/18/2022  Allergen Reaction Noted   Metformin and related Nausea And Vomiting 11/22/2019   Prednisone Other (See Comments) 03/20/2015   Zantac [ranitidine hcl] Diarrhea and Nausea Only 08/27/2018    Family History  Problem Relation Age of Onset   Diabetes Mother    Diabetes Mellitus II Mother    Hypothyroidism Mother    Hypertension Mother    Kidney failure Mother        Dialysis   Heart attack Mother  39 yo approximately   Hypertension Father    Gout Father    Cancer Maternal Grandfather    Diabetes Maternal Grandfather    Cancer Paternal Aunt     Social History   Socioeconomic History   Marital status: Widowed    Spouse name: Not on file   Number of children: 1   Years of education: 63   Highest education level: 12th grade  Occupational History   Occupation: disabled  Tobacco Use   Smoking status: Former    Packs/day: 0.50    Years: 33.00    Total pack years: 16.50    Types: Cigarettes    Quit date: 05/08/2020    Years since quitting: 2.2   Smokeless tobacco: Former    Quit date: 05/08/2020   Tobacco comments:    Quit Sept 2021  Vaping Use   Vaping Use: Former  Substance and Sexual Activity   Alcohol use: Not Currently    Comment: occassionally   Drug use: No   Sexual activity: Not Currently  Other Topics Concern   Not on file  Social History Narrative   Lives at home with wife and his dad's wife.        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    Social Determinants of Health   Financial Resource Strain: High Risk (04/23/2022)   Overall Financial Resource Strain (CARDIA)    Difficulty of Paying Living Expenses: Hard  Food Insecurity: Food Insecurity Present  (05/14/2022)   Hunger Vital Sign    Worried About Running Out of Food in the Last Year: Often true    Ran Out of Food in the Last Year: Sometimes true  Transportation Needs: No Transportation Needs (05/14/2022)   PRAPARE - Hydrologist (Medical): No    Lack of Transportation (Non-Medical): No  Physical Activity: Insufficiently Active (01/20/2018)   Exercise Vital Sign    Days of Exercise per Week: 7 days    Minutes of Exercise per Session: 10 min  Stress: Stress Concern Present (09/02/2017)   [place]    Feeling of Stress : Very much  Social Connections: Moderately Isolated (09/02/2017)   Social Connection and Isolation Panel [NHANES]    Frequency of Communication with Friends and Family: Twice a week    Frequency of Social Gatherings with Friends and Family: Once a week    Attends Religious Services: Never    Marine scientist or Organizations: No    Attends Archivist Meetings: Never    Marital Status: Widowed  Intimate Partner Violence: Not At Risk (05/14/2022)   Humiliation, Afraid, Rape, and Kick questionnaire    Fear of Current or Ex-Partner: No    Emotionally Abused: No    Physically Abused: No    Sexually Abused: No    Review of Systems: See HPI, otherwise negative ROS  Physical Exam: BP (!) 159/96   Pulse 79   Temp (!) 97 F (36.1 C) (Temporal)   Resp 18   Ht 6' (1.829 m)   Wt 108.9 kg   SpO2 100%   BMI 32.55 kg/m  General:   Alert,  pleasant and cooperative in NAD Head:  Normocephalic and atraumatic. Neck:  Supple; no masses or thyromegaly. Lungs:  Clear throughout to auscultation.    Heart:  Regular rate and rhythm. Abdomen:  Soft, nontender and nondistended. Normal bowel sounds,  without guarding, and without rebound.   Neurologic:  Alert and  oriented x4;  grossly normal neurologically.  Impression/Plan: Gabriel [name]. is here for an endoscopy  and colonoscopy to be performed for variceal screening, colon cancer screening  Risks, benefits, limitations, and alternatives regarding  endoscopy and colonoscopy have been reviewed with the patient.  Questions have been answered.  All parties agreeable.   Sherri Sear, MD  08/01/2022, 8:02 AM

## 2022-08-01 NOTE — Anesthesia Procedure Notes (Signed)
Procedure Name: MAC Date/Time: 08/01/2022 8:40 AM  Performed by: Jerrye Noble, CRNAPre-anesthesia Checklist: Patient identified, Emergency Drugs available, Suction available and Patient being monitored Patient Re-evaluated:Patient Re-evaluated prior to induction Oxygen Delivery Method: Simple face mask

## 2022-08-01 NOTE — Op Note (Signed)
[facility] Gastroenterology Patient Name: Gabriel [name] Procedure Date: 08/01/2022 8:36 AM MRN: [id number] Account #: [id number] Date of Birth: [birth year] Admit Type: Outpatient Age: 51 Room: [facility] ENDO ROOM 4 Gender: Male Note Status: Finalized Instrument Name: Upper Endoscope [id number] Procedure:             Upper GI endoscopy Indications:           Epigastric abdominal pain, Dyspepsia, Cirrhosis rule                         out esophageal varices Providers:             Lin Landsman MD, MD Referring MD:          Jaci Standard. Rollene Rotunda (Referring MD) Medicines:             General Anesthesia Complications:         No immediate complications. Estimated blood loss: None. Procedure:             Pre-Anesthesia Assessment:                        - Prior to the procedure, a History and Physical was                         performed, and patient medications and allergies were                         reviewed. The patient is competent. The risks and                         benefits of the procedure and the sedation options and                         risks were discussed with the patient. All questions                         were answered and informed consent was obtained.                         Patient identification and proposed procedure were                         verified by the physician, the nurse, the                         anesthesiologist, the anesthetist and the technician                         in the pre-procedure area in the procedure room in the                         endoscopy suite. Mental Status Examination: alert and                         oriented. Airway Examination: normal oropharyngeal                         airway and neck mobility. Respiratory Examination:  clear to auscultation. CV Examination: normal.                         Prophylactic Antibiotics: The patient does not require                         prophylactic  antibiotics. Prior Anticoagulants: The                         patient has taken Xarelto (rivaroxaban), last dose was                         3 days prior to procedure. ASA Grade Assessment: III -                         A patient with severe systemic disease. After                         reviewing the risks and benefits, the patient was                         deemed in satisfactory condition to undergo the                         procedure. The anesthesia plan was to use general                         anesthesia. Immediately prior to administration of                         medications, the patient was re-assessed for adequacy                         to receive sedatives. The heart rate, respiratory                         rate, oxygen saturations, blood pressure, adequacy of                         pulmonary ventilation, and response to care were                         monitored throughout the procedure. The physical                         status of the patient was re-assessed after the                         procedure.                        After obtaining informed consent, the endoscope was                         passed under direct vision. Throughout the procedure,                         the patient's blood pressure, pulse, and oxygen  saturations were monitored continuously. The Endoscope                         was introduced through the mouth, and advanced to the                         second part of duodenum. The upper GI endoscopy was                         accomplished without difficulty. The patient tolerated                         the procedure well. Findings:      The duodenal bulb and second portion of the duodenum were normal.      A few dispersed diminutive erosions with no bleeding and no stigmata of       recent bleeding were found in the gastric antrum. Biopsies were taken       with a cold forceps for histology.      One  non-bleeding linear and superficial gastric ulcer with a clean ulcer       base (Forrest Class III) was found on the greater curvature of the       gastric body. The lesion was 10 mm in largest dimension. Biopsies were       taken with a cold forceps for Helicobacter pylori testing.      The cardia and gastric fundus were normal on retroflexion.      The gastroesophageal junction and examined esophagus were normal. Impression:            - Normal duodenal bulb and second portion of the                         duodenum.                        - Erosive gastropathy with no bleeding and no stigmata                         of recent bleeding. Biopsied.                        - Non-bleeding gastric ulcer with a clean ulcer base                         (Forrest Class III). Biopsied.                        - Normal gastroesophageal junction and esophagus. Recommendation:        - Await pathology results.                        - Follow an antireflux regimen indefinitely.                        - Use a proton pump inhibitor PO BID.                        - Proceed with colonoscopy as scheduled  See colonoscopy report Procedure Code(s):     --- Professional ---                        [phone], Esophagogastroduodenoscopy, flexible,                         transoral; with biopsy, single or multiple Diagnosis Code(s):     --- Professional ---                        K31.89, Other diseases of stomach and duodenum                        K25.9, Gastric ulcer, unspecified as acute or chronic,                         without hemorrhage or perforation                        R10.13, Epigastric pain                        K74.60, Unspecified cirrhosis of liver CPT copyright 2022 American Medical Association. All rights reserved. The codes documented in this report are preliminary and upon coder review may  be revised to meet current compliance requirements. Dr. Ulyess Mort Lin Landsman MD, MD 08/01/2022 9:00:30 AM This report has been signed electronically. Number of Addenda: 0 Note Initiated On: 08/01/2022 8:36 AM Estimated Blood Loss:  Estimated blood loss: none.      [facility]

## 2022-08-01 NOTE — Anesthesia Preprocedure Evaluation (Addendum)
Anesthesia Evaluation  Patient identified by MRN, date of birth, ID band Patient awake    Reviewed: Allergy & Precautions, H&P , NPO status , Patient's Chart, lab work & pertinent test results, reviewed documented beta blocker date and time   Airway Mallampati: III   Neck ROM: full    Dental  (+) Poor Dentition   Pulmonary shortness of breath and with exertion, sleep apnea , COPD,  COPD inhaler, Patient abstained from smoking., former smoker   Pulmonary exam normal        Cardiovascular Exercise Tolerance: Poor hypertension, On Medications + angina with exertion + CAD, + Past MI, +CHF and + DOE  (-) Orthopnea and (-) PND Normal cardiovascular exam+ Cardiac Defibrillator  Rhythm:regular Rate:Normal  Recently saw Cards. Now with EF 35% with multivessel cad. Multiple admissions for chf and elevated glucose. Stable for above. ja   Neuro/Psych  Headaches PSYCHIATRIC DISORDERS  Depression       GI/Hepatic negative GI ROS, Neg liver ROS,,,  Endo/Other  diabetes, Well Controlled, Type 2, Oral Hypoglycemic Agents    Renal/GU negative Renal ROS  negative genitourinary   Musculoskeletal   Abdominal   Peds  Hematology negative hematology ROS (+)   Anesthesia Other Findings Past Medical History: No date: CAD (coronary artery disease)     Comment:  a. 04/2015 low risk MV;  b. 12/2016 Cath: minor irregs in               LAD/Diag/LCX/OM, RCA 40p/m/d; c. 05/2020 Cath: LM nl, LAD              50d, LCX 30p, OM1 40, RCA 100p w/ L->R collats. CO/CI               3.1/1.3-->Med rx. No date: Chest wall pain, chronic No date: Chronic Troponin Elevation No date: CKD (chronic kidney disease), stage II-III No date: COPD (chronic obstructive pulmonary disease) ([place]) No date: Diabetes mellitus without complication ([place]) No date: HFrEF (heart failure with reduced ejection fraction) ([place])     Comment:  a. 03/2015 Echo: EF 45-50%; b. 12/2015 Echo:  EF 20-25%; c.              02/2016 Echo: EF 30-35%; d. 11/2016 Echo: EF 40-45%; e.               06/2019 Echo: EF 30-35%; f. 11/2019 Echo: EF 25-30%; g.               05/2020 Echo: EF 35-40%; h. 05/2021 Echo: EF 40-45%; i.               11/2021 Echo: EF 20-25%, glob HK, mild LVH, GrIII DD, sev               red RV fxn, mild MR. No date: Hypertension No date: Mitral regurgitation     Comment:  Mild to moderate by October 2021 echocardiogram. No date: Mixed Ischemic & NICM (nonischemic cardiomyopathy) ([place])     Comment:  a. 03/2015 Echo: EF 45-50%; b. 12/2015 Echo: EF 20-25%; c.              02/2016 Echo: EF 30-35%; d. 11/2016 Echo: EF 40-45%; e.               06/2019 Echo: EF 30-35%; f. 11/2019 Echo: EF 25-30%; g.               05/2020 Echo: EF 35-40%; h. 05/2021 Echo: EF 40-45%; i.  11/2021 Echo: EF 20-25% No date: Myocardial infarct [facility]) No date: NSVT (nonsustained ventricular tachycardia) ([place])     Comment:  a. 12/2015 noted on tele-->amio;  b. 12/2015 Event               monitor: no VT noted. No date: Obesity (BMI 30.0-34.9) No date: Psoriasis 06/07/2020: Recurrent pulmonary emboli ([place])     Comment:  06/07/20: small bilateral PEs.  12/31/19: RUL and RLL               PEs. No date: Syncope     Comment:  a. 01/2016 - felt to be vasovagal. Past Surgical History: No date: AMPUTATION No date: CARDIAC CATHETERIZATION No date: FINGER AMPUTATION     Comment:  Traumatic No date: FINGER FRACTURE SURGERY; Left No date: FRACTURE SURGERY 01/06/2017: LEFT HEART CATH AND CORONARY ANGIOGRAPHY; N/A     Comment:  Procedure: Left Heart Cath and Coronary Angiography;                Surgeon: Wellington Hampshire, MD;  Location: [place]               CV LAB;  Service: Cardiovascular;  Laterality: N/A; 06/13/2020: RIGHT/LEFT HEART CATH AND CORONARY ANGIOGRAPHY; N/A     Comment:  Procedure: RIGHT/LEFT HEART CATH AND CORONARY               ANGIOGRAPHY;  Surgeon: Nelva Bush, MD;  Location:                [place] [facility];  Service: Cardiovascular;                Laterality: N/A; 05/20/2022: SUBQ ICD IMPLANT; N/A     Comment:  Procedure: SUBQ ICD IMPLANT;  Surgeon: Vickie Epley, MD;  Location: [place] [facility];  Service:               Cardiovascular;  Laterality: N/A;   Reproductive/Obstetrics negative OB ROS                             Anesthesia Physical Anesthesia Plan  ASA: 4  Anesthesia Plan: General   Post-op Pain Management:    Induction:   PONV Risk Score and Plan: 3  Airway Management Planned:   Additional Equipment:   Intra-op Plan:   Post-operative Plan:   Informed Consent: I have reviewed the patients History and Physical, chart, labs and discussed the procedure including the risks, benefits and alternatives for the proposed anesthesia with the patient or authorized representative who has indicated his/her understanding and acceptance.     Dental Advisory Given  Plan Discussed with: CRNA  Anesthesia Plan Comments:        Anesthesia Quick Evaluation

## 2022-08-01 NOTE — Anesthesia Postprocedure Evaluation (Signed)
Anesthesia Post Note  Patient: Gabriel [name].  Procedure(s) Performed: COLONOSCOPY WITH PROPOFOL ESOPHAGOGASTRODUODENOSCOPY (EGD) WITH PROPOFOL  Patient location during evaluation: PACU Anesthesia Type: General Level of consciousness: awake and alert Pain management: pain level controlled Vital Signs Assessment: post-procedure vital signs reviewed and stable Respiratory status: spontaneous breathing, nonlabored ventilation, respiratory function stable and patient connected to nasal cannula oxygen Cardiovascular status: blood pressure returned to baseline and stable Postop Assessment: no apparent nausea or vomiting Anesthetic complications: no   No notable events documented.   Last Vitals:  Vitals:   08/01/22 0933 08/01/22 0943  BP: (!) 126/96 138/89  Pulse: 94 81  Resp: (!) 24 (!) 21  Temp:    SpO2: 94% 97%    Last Pain:  Vitals:   08/01/22 0943  TempSrc:   PainSc: 0-No pain                 Molli Barrows

## 2022-08-01 NOTE — Op Note (Signed)
[facility] Patient Name: Gabriel [name] Procedure Date: 08/01/2022 8:35 AM MRN: [id number] Account #: [id number] Date of Birth: Jun 25, 1971 Admit Type: Outpatient Age: 51 Room: [facility] ENDO ROOM 4 Gender: Male Note Status: Finalized Instrument Name: Park Meo [id number] Procedure:             Colonoscopy Indications:           Screening for colorectal malignant neoplasm, This is                         the patient's first colonoscopy Providers:             Lin Landsman MD, MD Referring MD:          Jaci Standard. Rollene Rotunda (Referring MD) Medicines:             General Anesthesia Complications:         No immediate complications. Estimated blood loss: None. Procedure:             Pre-Anesthesia Assessment:                        - Prior to the procedure, a History and Physical was                         performed, and patient medications and allergies were                         reviewed. The patient is competent. The risks and                         benefits of the procedure and the sedation options and                         risks were discussed with the patient. All questions                         were answered and informed consent was obtained.                         Patient identification and proposed procedure were                         verified by the physician, the nurse, the                         anesthesiologist, the anesthetist and the technician                         in the pre-procedure area in the procedure room in the                         endoscopy suite. Mental Status Examination: alert and                         oriented. Airway Examination: normal oropharyngeal                         airway and neck mobility. Respiratory Examination:  clear to auscultation. CV Examination: normal.                         Prophylactic Antibiotics: The patient does not require                         prophylactic  antibiotics. Prior Anticoagulants: The                         patient has taken Xarelto (rivaroxaban), last dose was                         3 days prior to procedure. ASA Grade Assessment: III -                         A patient with severe systemic disease. After                         reviewing the risks and benefits, the patient was                         deemed in satisfactory condition to undergo the                         procedure. The anesthesia plan was to use general                         anesthesia. Immediately prior to administration of                         medications, the patient was re-assessed for adequacy                         to receive sedatives. The heart rate, respiratory                         rate, oxygen saturations, blood pressure, adequacy of                         pulmonary ventilation, and response to care were                         monitored throughout the procedure. The physical                         status of the patient was re-assessed after the                         procedure.                        After obtaining informed consent, the colonoscope was                         passed under direct vision. Throughout the procedure,                         the patient's blood pressure, pulse, and oxygen  saturations were monitored continuously. The                         Colonoscope was introduced through the anus and                         advanced to the the cecum, identified by appendiceal                         orifice and ileocecal valve. The colonoscopy was                         performed without difficulty. The patient tolerated                         the procedure well. The quality of the bowel                         preparation was adequate to identify polyps greater                         than 5 mm in size. The ileocecal valve, appendiceal                         orifice, and rectum were  photographed. Findings:      The perianal and digital rectal examinations were normal. Pertinent       negatives include normal sphincter tone and no palpable rectal lesions.      Two sessile polyps were found in the descending colon. The polyps were 3       to 5 mm in size. These polyps were removed with a cold snare. Resection       and retrieval were complete. Estimated blood loss: none.      The retroflexed view of the distal rectum and anal verge was normal and       showed no anal or rectal abnormalities. Impression:            - Two 3 to 5 mm polyps in the descending colon,                         removed with a cold snare. Resected and retrieved.                        - The distal rectum and anal verge are normal on                         retroflexion view. Recommendation:        - Discharge patient to home (with escort).                        - Resume previous diet today.                        - Continue present medications.                        - Await pathology results.                        -  Repeat colonoscopy in 5 years for surveillance based                         on pathology results.                        - Resume Xarelto (rivaroxaban) tomorrow at prior dose.                         Refer to managing physician for further adjustment of                         therapy. Procedure Code(s):     --- Professional ---                        [phone], Colonoscopy, flexible; with removal of                         tumor(s), polyp(s), or other lesion(s) by snare                         technique Diagnosis Code(s):     --- Professional ---                        Z12.11, Encounter for screening for malignant neoplasm                         of colon                        D12.4, Benign neoplasm of descending colon CPT copyright 2022 American Medical Association. All rights reserved. The codes documented in this report are preliminary and upon coder review may  be revised to  meet current compliance requirements. Dr. Ulyess Mort Lin Landsman MD, MD 08/01/2022 9:20:35 AM This report has been signed electronically. Number of Addenda: 0 Note Initiated On: 08/01/2022 8:35 AM Scope Withdrawal Time: 0 hours 11 minutes 4 seconds  Total Procedure Duration: 0 hours 14 minutes 42 seconds  Estimated Blood Loss:  Estimated blood loss: none.      [facility]

## 2022-08-01 NOTE — OR Nursing (Signed)
Patient void 455m of yellow clear urine. Vital signs stable, no shortness of breath, pt. Ready to go home. Notified of restarting Xarelto tomorrow.

## 2022-08-01 NOTE — Transfer of Care (Signed)
Immediate Anesthesia Transfer of Care Note  Patient: Gabriel [name].  Procedure(s) Performed: COLONOSCOPY WITH PROPOFOL ESOPHAGOGASTRODUODENOSCOPY (EGD) WITH PROPOFOL  Patient Location: PACU and Endoscopy Unit  Anesthesia Type:General  Level of Consciousness: awake, drowsy, and patient cooperative  Airway & Oxygen Therapy: Patient Spontanous Breathing  Post-op Assessment: Report given to RN and Post -op Vital signs reviewed and stable  Post vital signs: Reviewed and stable  Last Vitals:  Vitals Value Taken Time  BP 115/83 08/01/22 0923  Temp    Pulse 106 08/01/22 0924  Resp 22 08/01/22 0924  SpO2 96 % 08/01/22 0924  Vitals shown include unvalidated device data.  Last Pain:  Vitals:   08/01/22 0714  TempSrc: Temporal  PainSc: 0-No pain         Complications: No notable events documented.

## 2022-08-02 ENCOUNTER — Encounter: Payer: Self-pay | Admitting: Gastroenterology

## 2022-08-02 LAB — SURGICAL PATHOLOGY

## 2022-08-05 ENCOUNTER — Encounter: Payer: Self-pay | Admitting: Gastroenterology

## 2022-08-05 ENCOUNTER — Other Ambulatory Visit: Payer: Medicaid Other | Admitting: Pharmacist

## 2022-08-05 NOTE — Progress Notes (Signed)
08/05/2022 Name: Gabriel [name]. MRN: [id number] DOB: 11-16-1970  Chief Complaint  Patient presents with   Medication Management   Diabetes    Gabriel [name]. is a 51 y.o. year old male who presented for a telephone visit.   They were referred to the pharmacist by their PCP for assistance in managing diabetes.   Patient is participating in a Managed Medicaid Plan:  Yes  Subjective:  Care Team: Primary Care Provider: [name], FNP ; Next Scheduled Visit: 08/09/22  Medication Access/Adherence  Current Pharmacy:  [facility] [address] ([address]), [place] [address] [place] ([place])  [address] Phone: [phone] Fax: [facility], [place] [address] [place] [address] Phone: [phone] Fax: [phone]   Patient reports affordability concerns with their medications: No  Patient reports access/transportation concerns to their pharmacy: No  Patient reports adherence concerns with their medications:  No    Patient does report some financial strain getting to cardiac rehab. Will collaborate w/ SW colleagues on resource.    Diabetes:  Current medications: Ozempic 1 mg weekly, Basaglar 36 units daily, Humalog sliding scale (6 units with breakfast/lunch, 9 units with supper + 1 unit correction for every 50 mg/dL over 150); Farxiga 10 mg daily   Date of Download: 07/23/22-08/05/22 % Time CGM is active: 95% Average Glucose: 95 mg/dL Glucose Management Indicator: 131  Glucose Variability: 6.4 (goal <36%) Time in Goal:  - Time in range 70-180: 95% - Time above range: 5% - Time below range: 0% Observed patterns:   Patient denies hypoglycemic s/sx including dizziness, shakiness, sweating. Patient denies hyperglycemic symptoms including polyuria, polydipsia, polyphagia, nocturia, neuropathy, blurred  vision.  Hyperlipidemia/ASCVD Risk Reduction  Current lipid lowering medications: rosuvastatin 40 mg daily, fenofibrate 145 mg daily  Antiplatelet regimen: none due to DOAC  Heart Failure:  Current medications:  ACEi/ARB/ARNI: Entresto 49/51 mg twice daily SGLT2i: Farxiga 10 mg daily Beta blocker: carvedilol 3.125 mg twice daily Mineralocorticoid Receptor Antagonist: spironolactone 25 mg daily Diuretic regimen: torsemide 20 mg daily, second tablet if fluid retention; Furoscix if weight >250 lbs  Current home blood pressure readings: 120-130s/70-80s Current home weights: 240s;   Patient denies volume overload signs or symptoms including shortness of breath, lower extremity edema, increased use of pillows at night  Dizziness- less frequently now; instructed to take notes as to when he is having it   Health Maintenance  Health Maintenance Due  Topic Date Due   COVID-19 Vaccine (1) Never done   DTaP/Tdap/Td (1 - Tdap) Never done   Zoster Vaccines- Shingrix (1 of 2) Never done   FOOT EXAM  03/02/2021     Objective: Lab Results  Component Value Date   HGBA1C 7.1 (H) 07/22/2022    Lab Results  Component Value Date   CREATININE 1.88 (H) 07/25/2022   BUN 45 (H) 07/25/2022   NA 137 07/25/2022   K 4.4 07/25/2022   CL 102 07/25/2022   CO2 26 07/25/2022    Lab Results  Component Value Date   CHOL 110 11/25/2021   HDL 17 (L) 11/25/2021   LDLCALC 62 11/25/2021   LDLDIRECT 60.6 11/23/2020   TRIG 153 (H) 11/25/2021   CHOLHDL 6.5 11/25/2021    Medications Reviewed Today     Reviewed by Osker Mason, RPH-CPP (Pharmacist) on 08/05/22 at [id number]  Med List Status: <None>  Medication Order Taking? Sig Documenting Provider Last Dose Status Informant  blood glucose meter kit and supplies KIT [id number] Yes Dispense based on patient and insurance preference. Use up to four times daily as directed. [name], FNP Taking Active   carvedilol (COREG) 3.125 MG tablet  [id number] Yes Take 1 tablet (3.125 mg total) by mouth 2 (two) times daily with a meal. Theora Gianotti, NP Taking Active   clobetasol (TEMOVATE) 0.05 % external solution [id number]  Apply 1 Application topically 2 (two) times daily. [name], FNP  Active   Continuous Blood Gluc Sensor (FREESTYLE LIBRE 3 SENSOR) [place] [id number] Yes Place 1 sensor on the skin every 14 days. Use to check glucose continuously Virginia Crews, MD Taking Active   dapagliflozin propanediol (FARXIGA) 10 MG TABS tablet [id number] Yes Take 1 tablet (10 mg total) by mouth daily. Alisa Graff, FNP Taking Active   fenofibrate (TRICOR) 145 MG tablet [id number] Yes Take 1 tablet (145 mg total) by mouth daily. Alisa Graff, FNP Taking Active   FLUoxetine (PROZAC) 20 MG capsule [id number] Yes Take 1 capsule (20 mg total) by mouth daily. [name], FNP Taking Active   Furosemide (FUROSCIX) 80 MG/10ML CTKT [id number] Yes Inject 1 Dose into the skin as needed (edema). Bensimhon, Shaune Pascal, MD Taking Active   insulin aspart (NOVOLOG) 100 UNIT/ML FlexPen [id number] Yes Inject 6 units + correction before breakfast/lunch and 9 units + correction before supper (correction = 1 unit per 50 mg/dL above 150); max daily dose 36 units Tally Joe T, FNP Taking Active   Insulin Glargine (BASAGLAR KWIKPEN) 100 UNIT/ML [id number] Yes Inject 36 Units into the skin at bedtime. Swayze, Ava, DO Taking Active     Discontinued 01/21/22 1612 (Change in therapy)            Med Note Janan Ridge   Thu Feb 14, 2022  2:37 PM)    Insulin Pen Needle (PEN NEEDLES) 32G X 4 MM MISC [id number] Yes 200 each by Does not apply route at bedtime. [name], FNP Taking Active   Ipratropium-Albuterol (COMBIVENT RESPIMAT) 20-100 MCG/ACT AERS respimat [id number] No Inhale 1 puff into the lungs every 6 (six) hours as needed for wheezing.  Patient not taking: Reported on 08/05/2022   [provider] Not Taking Active Self  meclizine  (ANTIVERT) 25 MG tablet [id number] No Take 1 tablet (25 mg total) by mouth 3 (three) times daily as needed for dizziness.  Patient not taking: Reported on 08/05/2022   Patrecia Pour, MD Not Taking Active   nitroGLYCERIN (NITROSTAT) 0.4 MG SL tablet [id number]  Place 1 tablet (0.4 mg total) under the tongue every 5 (five) minutes x 3 doses as needed for chest pain. Lorella Nimrod, MD  Active Self  omeprazole (PRILOSEC) 40 MG capsule [id number] Yes Take 1 capsule (40 mg total) by mouth 2 (two) times daily before a meal for 30 days, THEN 1 capsule (40 mg total) daily before breakfast. Vanga, Tally Due, MD Taking Active   potassium chloride SA (KLOR-CON M) 20 MEQ tablet [id number] Yes Take 20 mEq by mouth every other day. [provider] Taking Active   rivaroxaban (XARELTO) 20 MG TABS tablet [id number] Yes Take 1 tablet (20 mg total) by mouth daily with supper. Shirley Friar, PA-C Taking Active            Med Note Jodi Mourning, Tillie Fantasia May 30, 2022  1:36 PM)    rosuvastatin (  CRESTOR) 40 MG tablet [id number] Yes Take 1 tablet (40 mg total) by mouth daily. Minna Merritts, MD Taking Active   sacubitril-valsartan [facility]) 49-51 MG [id number] Yes Take 1 tablet by mouth 2 (two) times daily. Bensimhon, Shaune Pascal, MD Taking Active   Semaglutide, 1 MG/DOSE, 4 MG/3ML SOPN [id number] Yes Inject 1 mg as directed once a week. [name], FNP Taking Active   spironolactone (ALDACTONE) 25 MG tablet [id number] Yes Take 1 tablet (25 mg total) by mouth daily. Alisa Graff, FNP Taking Active   torsemide (DEMADEX) 20 MG tablet [id number] Yes Take 40 mg by mouth daily. Take an extra 20 mg tablet daily as needed for edema [provider] Taking Active             SDOH Interventions Today    Flowsheet Row Most Recent Value  SDOH Interventions   Financial Strain Interventions Other (Comment)  [gas is expensive]       Assessment/Plan:   Diabetes: - Currently uncontrolled but  improved - Discussed potential to increase Ozempic, but patient would like to wait on that at this time. - Recommend to continue current regimen. Follow up with PCP as scheduled   Hyperlipidemia/ASCVD Risk Reduction: - Currently controlled.  - Recommended to continue current regimen at this time  Heart Failure: - Currently appropriately managed - Recommend to continue current regimen at this time  Follow Up Plan: PCP in 4 days, phone call with PharmD in ~ 6 weeks  Catie Hedwig Morton, PharmD, [place], [place] [phone]

## 2022-08-06 ENCOUNTER — Encounter: Payer: Medicaid Other | Admitting: *Deleted

## 2022-08-06 DIAGNOSIS — I5022 Chronic systolic (congestive) heart failure: Secondary | ICD-10-CM | POA: Diagnosis not present

## 2022-08-06 DIAGNOSIS — Z4889 Encounter for other specified surgical aftercare: Secondary | ICD-10-CM | POA: Diagnosis not present

## 2022-08-06 NOTE — Progress Notes (Signed)
Daily Session Note  Patient Details  Name: Gabriel [name]. MRN: [id number] Date of Birth: 08-May-1971 Referring Provider:   Flowsheet Row Cardiac Rehab from 07/01/2022 in [facility]  Referring Provider Bensimhon       Encounter Date: 08/06/2022  Check In:  Session Check In - 08/06/22 1100       Check-In   Supervising physician immediately available to respond to emergencies See telemetry face sheet for immediately available ER MD    Location [facility]    Staff Present Renita Papa, RN Abel Presto, MS, ACSM CEP, Exercise Physiologist;Jessica Luan Pulling, MA, RCEP, CCRP, CCET    Virtual Visit No    Medication changes reported     No    Fall or balance concerns reported    No    Warm-up and Cool-down Performed on first and last piece of equipment    Resistance Training Performed Yes    VAD Patient? No    PAD/SET Patient? No      Pain Assessment   Currently in Pain? No/denies                Social History   Tobacco Use  Smoking Status Former   Packs/day: 0.50   Years: 33.00   Total pack years: 16.50   Types: Cigarettes   Quit date: 05/08/2020   Years since quitting: 2.2  Smokeless Tobacco Former   Quit date: 05/08/2020  Tobacco Comments   Quit Sept 2021    Goals Met:  Independence with exercise equipment Exercise tolerated well No report of concerns or symptoms today Strength training completed today  Goals Unmet:  Not Applicable  Comments: Pt able to follow exercise prescription today without complaint.  Will continue to monitor for progression.    Dr. Emily Filbert is Medical Director for [place].  Dr. Ottie Glazier is Medical Director for [facility].

## 2022-08-08 ENCOUNTER — Encounter: Payer: Medicaid Other | Admitting: *Deleted

## 2022-08-08 DIAGNOSIS — I5022 Chronic systolic (congestive) heart failure: Secondary | ICD-10-CM | POA: Diagnosis not present

## 2022-08-08 DIAGNOSIS — Z4889 Encounter for other specified surgical aftercare: Secondary | ICD-10-CM | POA: Diagnosis not present

## 2022-08-08 IMAGING — CR DG CHEST 2V
2 series · 2 of 2 positions shown · non-contrast
Comparison: None.

CLINICAL DATA: Shortness of breath and right-sided rib pain.

EXAM:
CHEST - 2 VIEW

[chest pa]
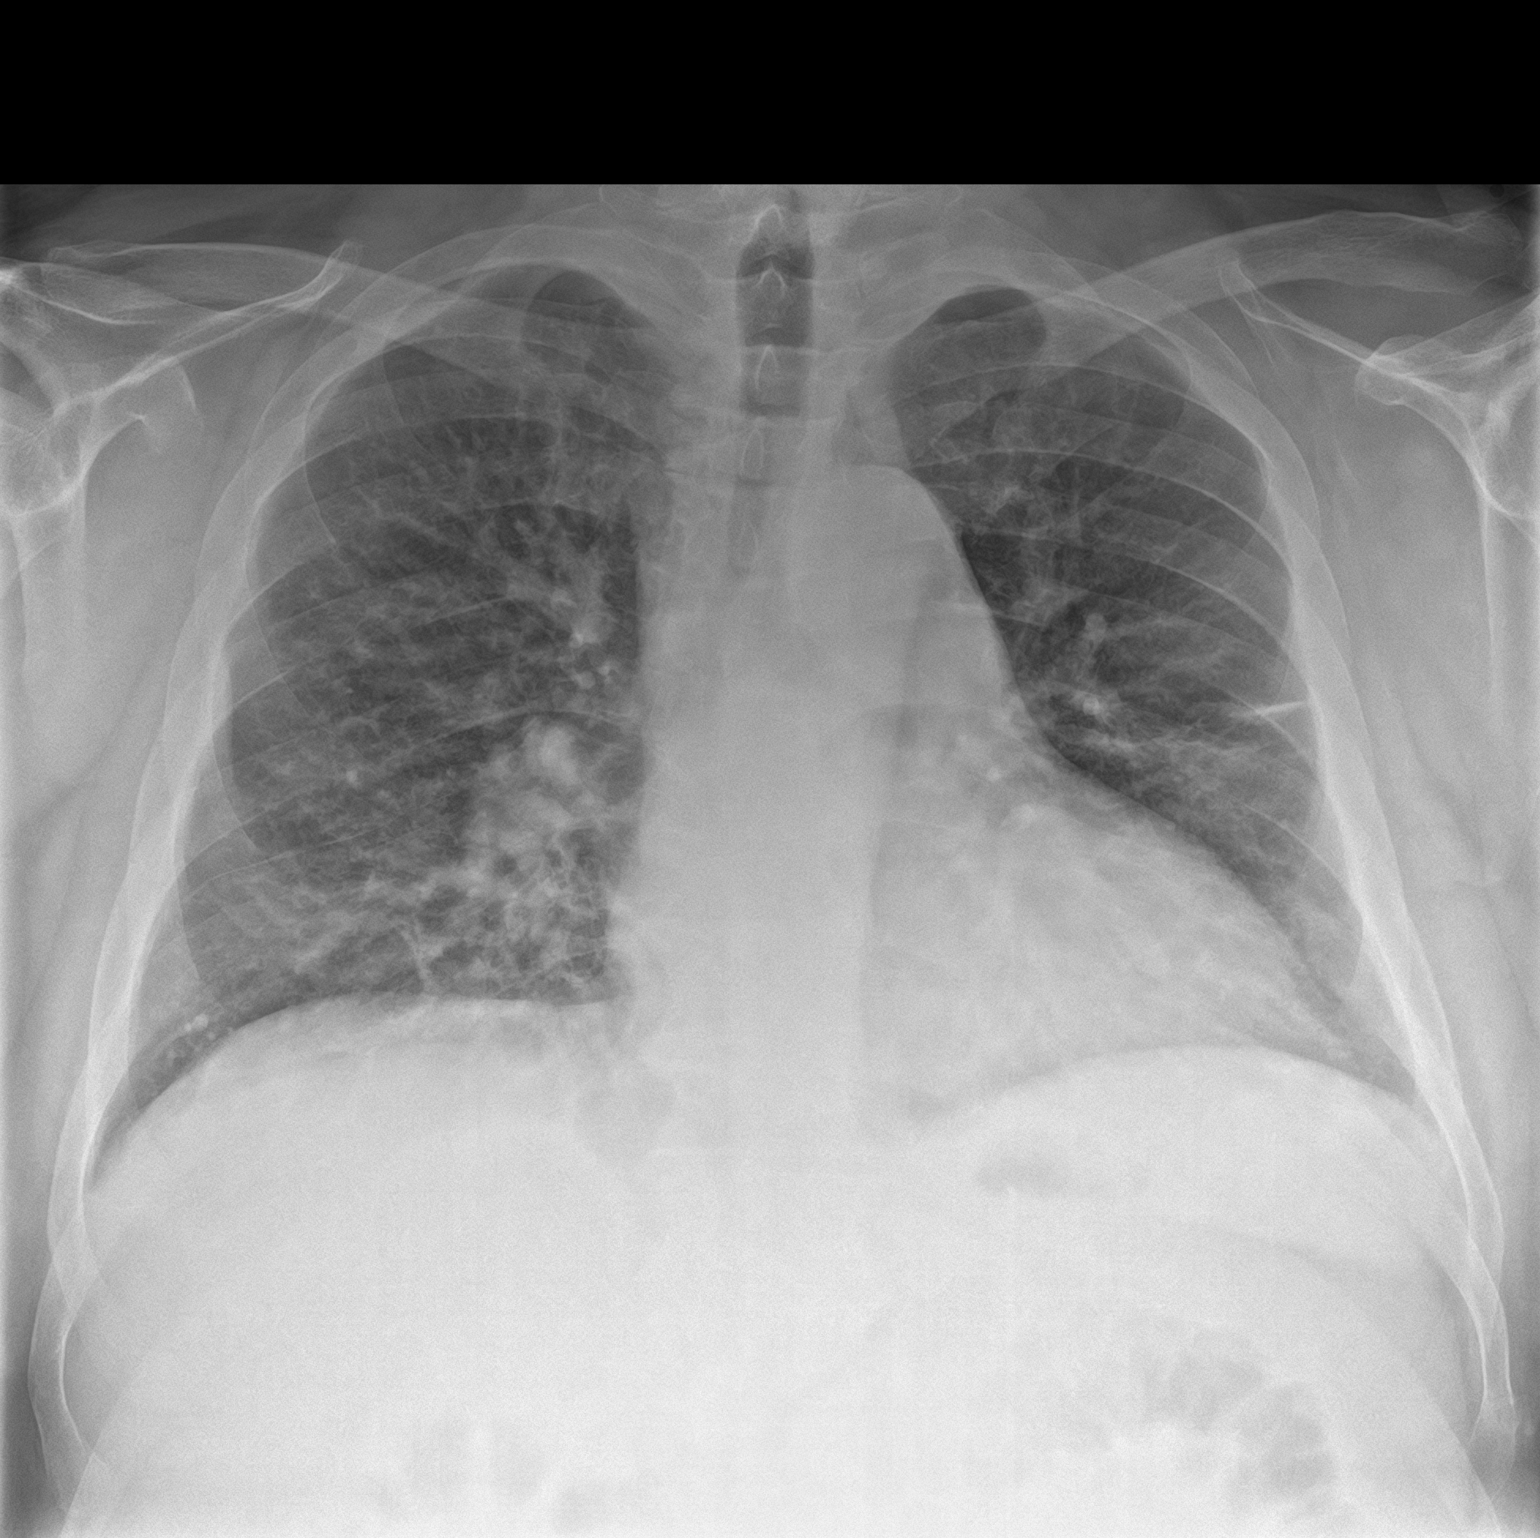

[chest lat]
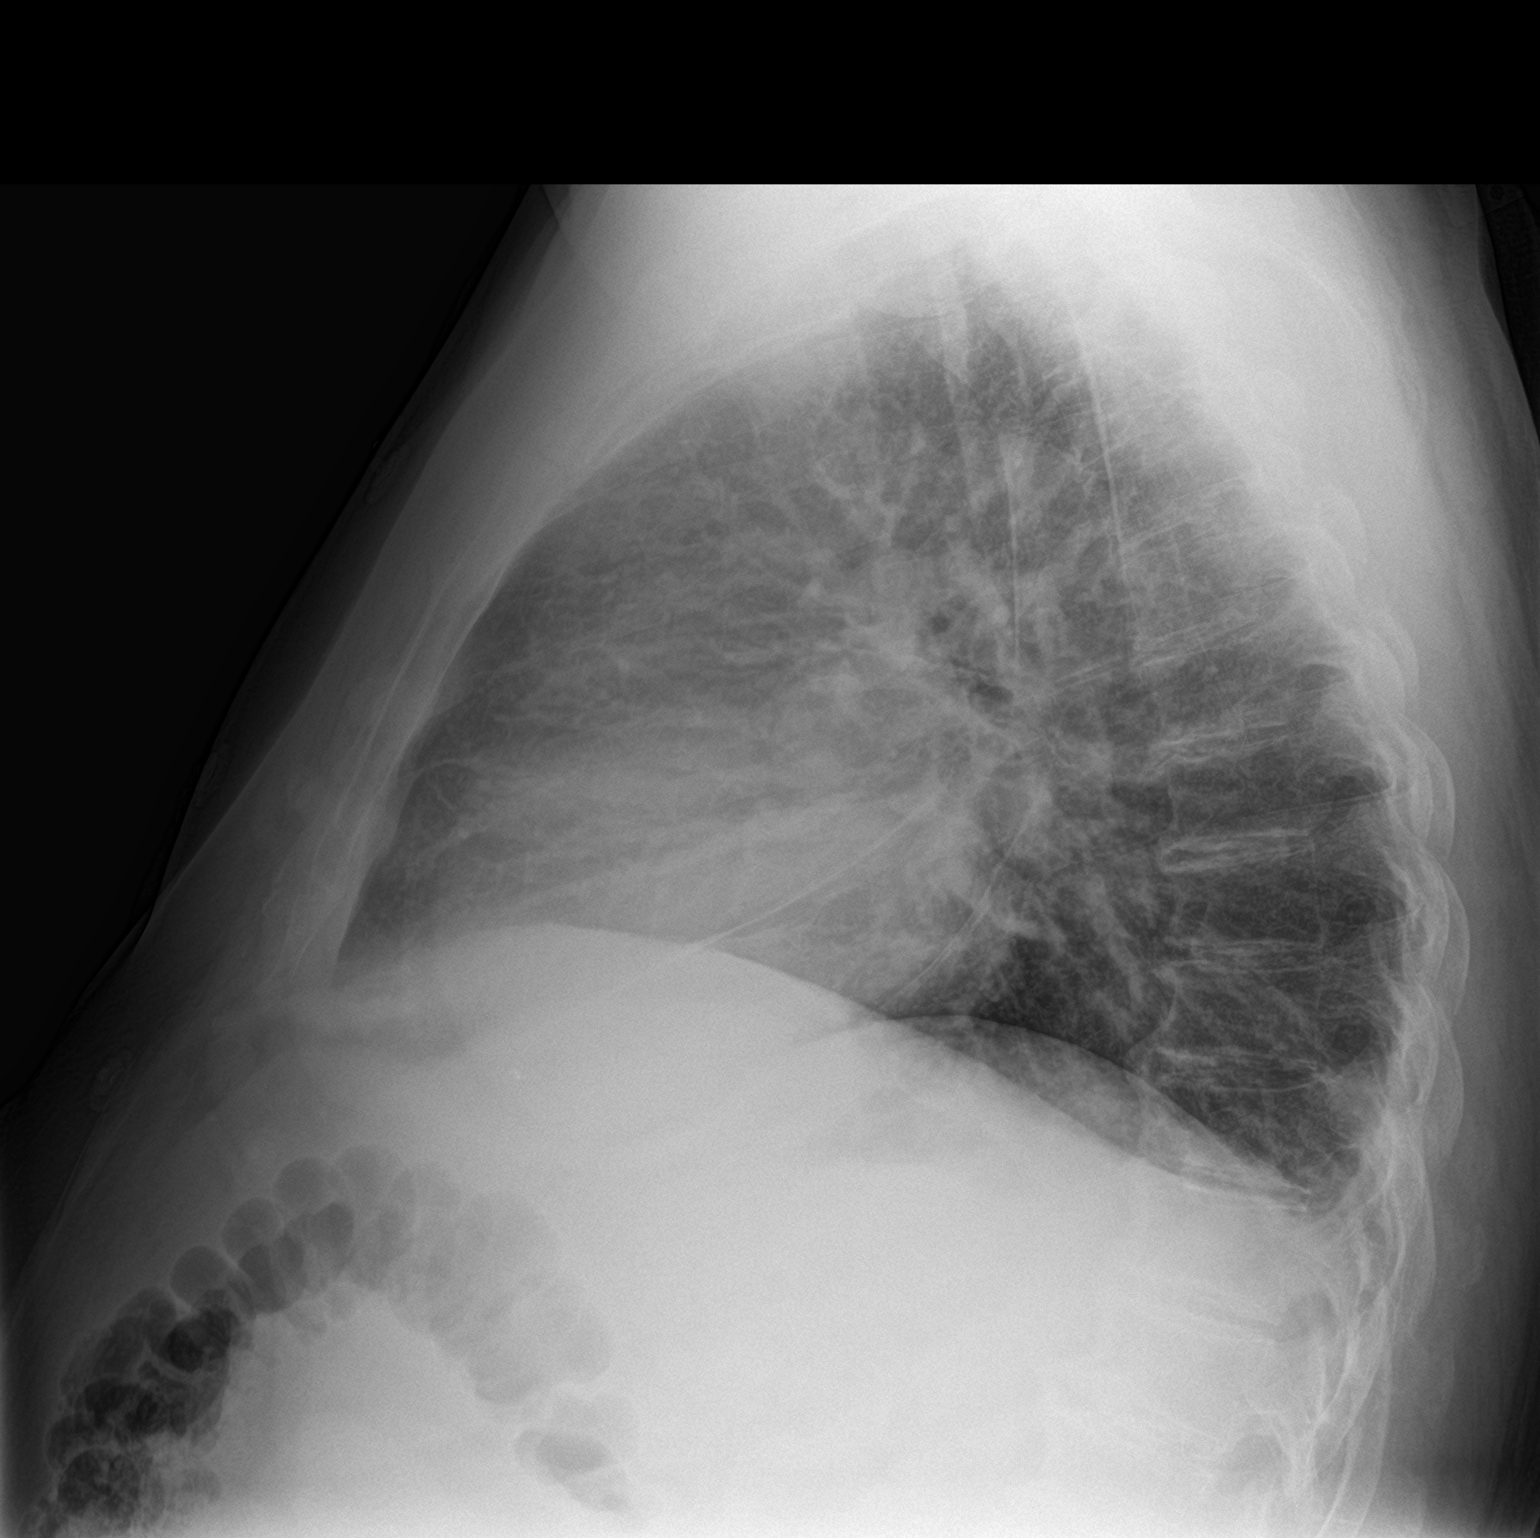

[2 of 2 positions shown; findings below may reference images not displayed]

FINDINGS: Mild, diffusely increased interstitial lung markings are seen. Mild,
stable linear scarring is noted within the lateral aspect of the mid
left lung with mild, stable atelectasis is seen within the bilateral
lung bases. There is no evidence of a pleural effusion or
pneumothorax. The heart size and mediastinal contours are within
normal limits. No acute osseous abnormalities are identified.
IMPRESSION: 1. Mild, stable areas of linear scarring and bibasilar atelectasis.
2. Mildly increased interstitial lung markings which may represent
sequelae associated with mild interstitial edema.

## 2022-08-08 NOTE — Progress Notes (Signed)
I,Connie R Striblin,acting as a Education administrator for [name], FNP.,have documented all relevant documentation on the behalf of [name], FNP,as directed by  [name], FNP while in the presence of [name], FNP.   Established patient visit   Patient: Gabriel [name].   DOB: 07-Feb-1971   51 y.o. Male  MRN: [id number] Visit Date: 08/09/2022  Today's healthcare provider: [name], FNP  Re Introduced to nurse practitioner role and practice setting.  All questions answered.  Discussed provider/patient relationship and expectations.  Chief Complaint  Patient presents with   Follow-up   Subjective    HPI  Diabetes Mellitus Type II, Follow-up  Lab Results  Component Value Date   HGBA1C 7.1 (H) 07/22/2022   HGBA1C 7.5 (H) 05/14/2022   HGBA1C 7.6 (A) 05/09/2022   Wt Readings from Last 3 Encounters:  08/09/22 242 lb 4.8 oz (109.9 kg)  08/01/22 240 lb (108.9 kg)  07/25/22 240 lb (108.9 kg)   Last seen for diabetes 3 months ago.  Management since then includes Continuing crestor 40 mg . He reports excellent compliance with treatment. He is not having side effects.  Symptoms: No fatigue No foot ulcerations  Yes appetite changes No nausea  No paresthesia of the feet  No polydipsia  No polyuria Yes visual disturbances   No vomiting     Home blood sugar records: postprandial range: 180  Episodes of hypoglycemia? Yes 1 time, no side effects (69 during sleep)   Current insulin regiment: 6 units per meal Most Recent Eye Exam: 03/2022 Current exercise: none Current diet habits: in general, a "healthy" diet    Pertinent Labs: Lab Results  Component Value Date   CHOL 110 11/25/2021   HDL 17 (L) 11/25/2021   LDLCALC 62 11/25/2021   LDLDIRECT 60.6 11/23/2020   TRIG 153 (H) 11/25/2021   CHOLHDL 6.5 11/25/2021   Lab Results  Component Value Date   NA 137 07/25/2022   K 4.4 07/25/2022   CREATININE 1.88 (H) 07/25/2022   GFRNONAA 42 (L) 07/25/2022    LABMICR 6.4 02/08/2022     ---------------------------------------------------------------------------------------------------   Medications: Outpatient Medications Prior to Visit  Medication Sig   blood glucose meter kit and supplies KIT Dispense based on patient and insurance preference. Use up to four times daily as directed.   carvedilol (COREG) 3.125 MG tablet Take 1 tablet (3.125 mg total) by mouth 2 (two) times daily with a meal.   clobetasol (TEMOVATE) 0.05 % external solution Apply 1 Application topically 2 (two) times daily.   Continuous Blood Gluc Sensor (FREESTYLE LIBRE 3 SENSOR) MISC Place 1 sensor on the skin every 14 days. Use to check glucose continuously   dapagliflozin propanediol (FARXIGA) 10 MG TABS tablet Take 1 tablet (10 mg total) by mouth daily.   fenofibrate (TRICOR) 145 MG tablet Take 1 tablet (145 mg total) by mouth daily.   FLUoxetine (PROZAC) 20 MG capsule Take 1 capsule (20 mg total) by mouth daily.   Furosemide (FUROSCIX) 80 MG/10ML CTKT Inject 1 Dose into the skin as needed (edema).   insulin aspart (NOVOLOG) 100 UNIT/ML FlexPen Inject 6 units + correction before breakfast/lunch and 9 units + correction before supper (correction = 1 unit per 50 mg/dL above 150); max daily dose 36 units   Insulin Glargine (BASAGLAR KWIKPEN) 100 UNIT/ML Inject 36 Units into the skin at bedtime.   Insulin Pen Needle (PEN NEEDLES) 32G X 4 MM MISC 200 each by  Does not apply route at bedtime.   Ipratropium-Albuterol (COMBIVENT RESPIMAT) 20-100 MCG/ACT AERS respimat Inhale 1 puff into the lungs every 6 (six) hours as needed for wheezing.   meclizine (ANTIVERT) 25 MG tablet Take 1 tablet (25 mg total) by mouth 3 (three) times daily as needed for dizziness.   nitroGLYCERIN (NITROSTAT) 0.4 MG SL tablet Place 1 tablet (0.4 mg total) under the tongue every 5 (five) minutes x 3 doses as needed for chest pain.   omeprazole (PRILOSEC) 40 MG capsule Take 1 capsule (40 mg total) by mouth 2 (two)  times daily before a meal for 30 days, THEN 1 capsule (40 mg total) daily before breakfast.   potassium chloride SA (KLOR-CON M) 20 MEQ tablet Take 20 mEq by mouth every other day.   rivaroxaban (XARELTO) 20 MG TABS tablet Take 1 tablet (20 mg total) by mouth daily with supper.   rosuvastatin (CRESTOR) 40 MG tablet Take 1 tablet (40 mg total) by mouth daily.   sacubitril-valsartan (ENTRESTO) 49-51 MG Take 1 tablet by mouth 2 (two) times daily.   Semaglutide, 1 MG/DOSE, 4 MG/3ML SOPN Inject 1 mg as directed once a week.   spironolactone (ALDACTONE) 25 MG tablet Take 1 tablet (25 mg total) by mouth daily.   torsemide (DEMADEX) 20 MG tablet Take 40 mg by mouth daily. Take an extra 20 mg tablet daily as needed for edema   No facility-administered medications prior to visit.    Review of Systems    Objective    BP 131/79 (BP Location: Left Arm, Patient Position: Sitting, Cuff Size: Normal)   Pulse 85   Temp 98.1 F (36.7 C) (Oral)   Wt 242 lb 4.8 oz (109.9 kg)   SpO2 98%   BMI 32.86 kg/m   Physical Exam Vitals and nursing note reviewed.  Constitutional:      Appearance: Normal appearance. He is obese.  HENT:     Head: Normocephalic and atraumatic.  Eyes:     Pupils: Pupils are equal, round, and reactive to light.  Cardiovascular:     Rate and Rhythm: Normal rate and regular rhythm.     Pulses: Normal pulses.     Heart sounds: Normal heart sounds.  Pulmonary:     Effort: Pulmonary effort is normal.     Breath sounds: Normal breath sounds.  Musculoskeletal:        General: Normal range of motion.     Cervical back: Normal range of motion.  Skin:    General: Skin is warm and dry.     Capillary Refill: Capillary refill takes less than 2 seconds.  Neurological:     General: No focal deficit present.     Mental Status: He is alert and oriented to person, place, and time. Mental status is at baseline.  Psychiatric:        Mood and Affect: Mood normal.        Behavior: Behavior  normal.        Thought Content: Thought content normal.        Judgment: Judgment normal.     No results found for any visits on 08/09/22.  Assessment & Plan     Problem List Items Addressed This Visit       Cardiovascular and Mediastinum   CAD (coronary artery disease)    Chronic with secondary prevention ICD in place Repeat CMP and LP at this time Continue to recommend balanced, lower carb meals. Smaller meal size, adding snacks. Choosing water as  drink of choice and increasing purposeful exercise. 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      Return in about 3 months (around 11/08/2022) for chonic disease management.     Vonna Kotyk, FNP, have reviewed all documentation for this visit. The documentation on 08/09/22 for the exam, diagnosis, procedures, and orders are all accurate and complete.  [name], [place] [phone] (phone) [phone] (fax)  [place] [place]

## 2022-08-08 NOTE — Progress Notes (Signed)
Daily Session Note  Patient Details  Name: Deddrick Saindon. MRN: [id number] Date of Birth: [birth year] Referring Provider:   Flowsheet Row Cardiac Rehab from 07/01/2022 in [facility] Cardiac [facility]  Referring Provider Bensimhon       Encounter Date: 08/08/2022  Check In:  Session Check In - 08/08/22 1108       Check-In   Supervising physician immediately available to respond to emergencies See telemetry face sheet for immediately available ER MD    Location [facility]-Cardiac & Pulmonary Rehab    Staff Present Renita Papa, RN BSN;Joseph Lakewood, RCP,RRT,BSRT;Noah [place], [place], Exercise Physiologist    Virtual Visit No    Medication changes reported     No    Fall or balance concerns reported    No    Warm-up and Cool-down Performed on first and last piece of equipment    Resistance Training Performed Yes    VAD Patient? No    PAD/SET Patient? No      Pain Assessment   Currently in Pain? No/denies                Social History   Tobacco Use  Smoking Status Former   Packs/day: 0.50   Years: 33.00   Total pack years: 16.50   Types: Cigarettes   Quit date: 05/08/2020   Years since quitting: 2.2  Smokeless Tobacco Former   Quit date: 05/08/2020  Tobacco Comments   Quit Sept 2021    Goals Met:  Independence with exercise equipment Exercise tolerated well No report of concerns or symptoms today Strength training completed today  Goals Unmet:  Not Applicable  Comments:  Navdeep is having to leave the program early due to financial issues. His home exercise has been reviewed and education folder was given.    Dr. Emily Filbert is Medical Director for [facility].  Dr. Ottie Glazier is Medical Director for [facility].

## 2022-08-08 NOTE — Progress Notes (Signed)
Cardiac Individual Treatment Plan  Patient Details  Name: Gabriel [name]. MRN: [id number] Date of Birth: 1971-03-04 Referring Provider:   Flowsheet Row Cardiac Rehab from 07/01/2022 in [facility] Cardiac and Pulmonary Rehab  Referring Provider Bensimhon       Initial Encounter Date:  Flowsheet Row Cardiac Rehab from 07/01/2022 in [facility] Cardiac and Pulmonary Rehab  Date 07/01/22       Visit Diagnosis: Heart failure, chronic systolic (HCC)  Patient's Home Medications on Admission:  Current Outpatient Medications:    blood glucose meter kit and supplies KIT, Dispense based on patient and insurance preference. Use up to four times daily as directed., Disp: 1 each, Rfl: 11   carvedilol (COREG) 3.125 MG tablet, Take 1 tablet (3.125 mg total) by mouth 2 (two) times daily with a meal., Disp: 60 tablet, Rfl: 3   clobetasol (TEMOVATE) 0.05 % external solution, Apply 1 Application topically 2 (two) times daily., Disp: 50 mL, Rfl: 1   Continuous Blood Gluc Sensor (FREESTYLE LIBRE 3 SENSOR) MISC, Place 1 sensor on the skin every 14 days. Use to check glucose continuously, Disp: 6 each, Rfl: 3   dapagliflozin propanediol (FARXIGA) 10 MG TABS tablet, Take 1 tablet (10 mg total) by mouth daily., Disp: 30 tablet, Rfl: 5   fenofibrate (TRICOR) 145 MG tablet, Take 1 tablet (145 mg total) by mouth daily., Disp: 90 tablet, Rfl: 3   FLUoxetine (PROZAC) 20 MG capsule, Take 1 capsule (20 mg total) by mouth daily., Disp: 90 capsule, Rfl: 3   Furosemide (FUROSCIX) 80 MG/10ML CTKT, Inject 1 Dose into the skin as needed (edema)., Disp: 6 each, Rfl: 0   insulin aspart (NOVOLOG) 100 UNIT/ML FlexPen, Inject 6 units + correction before breakfast/lunch and 9 units + correction before supper (correction = 1 unit per 50 mg/dL above 150); max daily dose 36 units, Disp: 15 mL, Rfl: 3   Insulin Glargine (BASAGLAR KWIKPEN) 100 UNIT/ML, Inject 36 Units into the skin at bedtime., Disp: 30 mL, Rfl: 3   Insulin Pen Needle (PEN  NEEDLES) 32G X 4 MM MISC, 200 each by Does not apply route at bedtime., Disp: 200 each, Rfl: 3   Ipratropium-Albuterol (COMBIVENT RESPIMAT) 20-100 MCG/ACT AERS respimat, Inhale 1 puff into the lungs every 6 (six) hours as needed for wheezing. (Patient not taking: Reported on 08/05/2022), Disp: , Rfl:    meclizine (ANTIVERT) 25 MG tablet, Take 1 tablet (25 mg total) by mouth 3 (three) times daily as needed for dizziness. (Patient not taking: Reported on 08/05/2022), Disp: 15 tablet, Rfl: 0   nitroGLYCERIN (NITROSTAT) 0.4 MG SL tablet, Place 1 tablet (0.4 mg total) under the tongue every 5 (five) minutes x 3 doses as needed for chest pain., Disp: 30 tablet, Rfl: 0   omeprazole (PRILOSEC) 40 MG capsule, Take 1 capsule (40 mg total) by mouth 2 (two) times daily before a meal for 30 days, THEN 1 capsule (40 mg total) daily before breakfast., Disp: 150 capsule, Rfl: 0   potassium chloride SA (KLOR-CON M) 20 MEQ tablet, Take 20 mEq by mouth every other day., Disp: , Rfl:    rivaroxaban (XARELTO) 20 MG TABS tablet, Take 1 tablet (20 mg total) by mouth daily with supper., Disp: 90 tablet, Rfl: 3   rosuvastatin (CRESTOR) 40 MG tablet, Take 1 tablet (40 mg total) by mouth daily., Disp: 90 tablet, Rfl: 3   sacubitril-valsartan (ENTRESTO) 49-51 MG, Take 1 tablet by mouth 2 (two) times daily., Disp: 60 tablet, Rfl: 3   Semaglutide,  1 MG/DOSE, 4 MG/3ML SOPN, Inject 1 mg as directed once a week., Disp: 9 mL, Rfl: 0   spironolactone (ALDACTONE) 25 MG tablet, Take 1 tablet (25 mg total) by mouth daily., Disp: 90 tablet, Rfl: 3   torsemide (DEMADEX) 20 MG tablet, Take 40 mg by mouth daily. Take an extra 20 mg tablet daily as needed for edema, Disp: , Rfl:   Past Medical History: Past Medical History:  Diagnosis Date   CAD (coronary artery disease)    a. 04/2015 low risk MV;  b. 12/2016 Cath: minor irregs in LAD/Diag/LCX/OM, RCA 40p/m/d; c. 05/2020 Cath: LM nl, LAD 50d, LCX 30p, OM1 40, RCA 100p w/ L->R collats. CO/CI  3.1/1.3-->Med rx.   Chest wall pain, chronic    Chronic Troponin Elevation    CKD (chronic kidney disease), stage II-III    COPD (chronic obstructive pulmonary disease) (HCC)    Diabetes mellitus without complication ([place])    HFrEF (heart failure with reduced ejection fraction) ([place])    a. 03/2015 Echo: EF 45-50%; b. 12/2015 Echo: EF 20-25%; c. 02/2016 Echo: EF 30-35%; d. 11/2016 Echo: EF 40-45%; e. 06/2019 Echo: EF 30-35%; f. 11/2019 Echo: EF 25-30%; g. 05/2020 Echo: EF 35-40%; h. 05/2021 Echo: EF 40-45%; i. 11/2021 Echo: EF 20-25%, glob HK, mild LVH, GrIII DD, sev red RV fxn, mild MR.   Hypertension    Mitral regurgitation    Mild to moderate by October 2021 echocardiogram.   Mixed Ischemic & NICM (nonischemic cardiomyopathy) ([place])    a. 03/2015 Echo: EF 45-50%; b. 12/2015 Echo: EF 20-25%; c. 02/2016 Echo: EF 30-35%; d. 11/2016 Echo: EF 40-45%; e. 06/2019 Echo: EF 30-35%; f. 11/2019 Echo: EF 25-30%; g. 05/2020 Echo: EF 35-40%; h. 05/2021 Echo: EF 40-45%; i. 11/2021 Echo: EF 20-25%   Myocardial infarct (HCC)    NSVT (nonsustained ventricular tachycardia) ([place])    a. 12/2015 noted on tele-->amio;  b. 12/2015 Event monitor: no VT noted.   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  12/31/19: RUL and RLL PEs.   Syncope    a. 01/2016 - felt to be vasovagal.    Tobacco Use: Social History   Tobacco Use  Smoking Status Former   Packs/day: 0.50   Years: 33.00   Total pack years: 16.50   Types: Cigarettes   Quit date: 05/08/2020   Years since quitting: 2.2  Smokeless Tobacco Former   Quit date: 05/08/2020  Tobacco Comments   Quit Sept 2021    Labs: Review Flowsheet  More data exists      Latest Ref Rng & Units 11/25/2021 02/08/2022 05/09/2022 05/14/2022 07/22/2022  Labs for ITP Cardiac and Pulmonary Rehab  Cholestrol 0 - 200 mg/dL 110  - - - -  LDL (calc) 0 - 99 mg/dL 62  - - - -  HDL-C >40 mg/dL 17  - - - -  Trlycerides  <150 mg/dL 153  - - - -  Hemoglobin A1c 4.8 - 5.6 % - 9.8  7.6  7.5  7.1      Exercise Target Goals: Exercise Program Goal: Individual exercise prescription set using results from initial 6 min walk test and THRR while considering  patient's activity barriers and safety.   Exercise Prescription Goal: Initial exercise prescription builds to 30-45 minutes a day of aerobic activity, 2-3 days per week.  Home exercise guidelines will be given to patient during program as part of exercise  prescription that the participant will acknowledge.   Education: Aerobic Exercise: - Group verbal and visual presentation on the components of exercise prescription. Introduces F.I.T.T principle from ACSM for exercise prescriptions.  Reviews F.I.T.T. principles of aerobic exercise including progression. Written material given at graduation. Flowsheet Row Cardiac Rehab from 07/25/2022 in [facility] Cardiac and Pulmonary Rehab  Education need identified 07/01/22       Education: Resistance Exercise: - Group verbal and visual presentation on the components of exercise prescription. Introduces F.I.T.T principle from ACSM for exercise prescriptions  Reviews F.I.T.T. principles of resistance exercise including progression. Written material given at graduation.    Education: Exercise & Equipment Safety: - Individual verbal instruction and demonstration of equipment use and safety with use of the equipment. Flowsheet Row Cardiac Rehab from 07/25/2022 in [facility] Cardiac and Pulmonary Rehab  Date 07/01/22  Educator [facility]  Instruction Review Code 1- Verbalizes Understanding       Education: Exercise Physiology & General Exercise Guidelines: - Group verbal and written instruction with models to review the exercise physiology of the cardiovascular system and associated critical values. Provides general exercise guidelines with specific guidelines to those with heart or lung disease.    Education: Flexibility, Balance,  Mind/Body Relaxation: - Group verbal and visual presentation with interactive activity on the components of exercise prescription. Introduces F.I.T.T principle from ACSM for exercise prescriptions. Reviews F.I.T.T. principles of flexibility and balance exercise training including progression. Also discusses the mind body connection.  Reviews various relaxation techniques to help reduce and manage stress (i.e. Deep breathing, progressive muscle relaxation, and visualization). Balance handout provided to take home. Written material given at graduation.   Activity Barriers & Risk Stratification:   6 Minute Walk:  6 Minute Walk     Row Name 07/01/22 1600         6 Minute Walk   Phase Initial     Distance 700 feet     Walk Time 5.75 minutes     # of Rest Breaks 1     MPH 1.38     METS 2.28     RPE 19     Perceived Dyspnea  4     VO2 Peak 7.97     Symptoms No     Resting HR 75 bpm     Resting BP 148/80     Resting Oxygen Saturation  96 %     Exercise Oxygen Saturation  during 6 min walk 96 %     Max Ex. HR 104 bpm     Max Ex. BP 138/80     2 Minute Post BP 124/80              Oxygen Initial Assessment:   Oxygen Re-Evaluation:   Oxygen Discharge (Final Oxygen Re-Evaluation):   Initial Exercise Prescription:  Initial Exercise Prescription - 07/01/22 1600       Date of Initial Exercise RX and Referring Provider   Date 07/01/22    Referring Provider Bensimhon      Oxygen   Maintain Oxygen Saturation 88% or higher      Treadmill   MPH 1    Grade 0    Minutes 15    METs 1.8      NuStep   Level 1    SPM 80    Minutes 15    METs 2.28      T5 Nustep   Level 1    SPM 80    Minutes 15    METs  2.28      Biostep-RELP   Level 1    SPM 50    Minutes 15    METs 2.28      Track   Laps 7    Minutes 15    METs 1.38      Prescription Details   Frequency (times per week) 2    Duration Progress to 30 minutes of continuous aerobic without signs/symptoms of  physical distress      Intensity   THRR 40-80% of Max Heartrate 111-148    Ratings of Perceived Exertion 11-13    Perceived Dyspnea 0-4      Progression   Progression Continue to progress workloads to maintain intensity without signs/symptoms of physical distress.      Resistance Training   Training Prescription Yes    Weight 3    Reps 10-15             Perform Capillary Blood Glucose checks as needed.  Exercise Prescription Changes:   Exercise Prescription Changes     Row Name 07/01/22 1600 07/17/22 1100 07/30/22 1000         Response to Exercise   Blood Pressure (Admit) 148/80 130/78 --     Blood Pressure (Exercise) 138/80 138/66 --     Blood Pressure (Exit) 124/80 132/84 --     Heart Rate (Admit) 75 bpm 82 bpm --     Heart Rate (Exercise) 104 bpm 108 bpm --     Heart Rate (Exit) 88 bpm 99 bpm --     Oxygen Saturation (Admit) 96 % -- --     Oxygen Saturation (Exercise) 97 % -- --     Oxygen Saturation (Exit) 96 % -- --     Rating of Perceived Exertion (Exercise) 19 14 --     Perceived Dyspnea (Exercise) 4 -- --     Symptoms yes  Patient reported that he had 8/10 chest pain that was relieved to his baseline 4/10 chronic chest pain with rest. He also reported knee and hip tiredness and discomfort. fatigue --     Comments 6 MWT results 1st day of exercise --     Duration -- Progress to 30 minutes of  aerobic without signs/symptoms of physical distress --     Intensity -- THRR unchanged --       Progression   Progression -- Continue to progress workloads to maintain intensity without signs/symptoms of physical distress. --     Average METs -- 1.88 --       Resistance Training   Training Prescription -- Yes --     Weight -- 3 lb --     Reps -- 10-15 --       Interval Training   Interval Training -- No --       Treadmill   MPH -- 1 --     Grade -- 0 --     Minutes -- 15 --     METs -- 1.8 --       Recumbant Bike   Level -- 1 --     Watts -- 25 --      Minutes -- 15 --     METs -- 2 --       Home Exercise Plan   Plans to continue exercise at -- -- Home (comment)  walking, weights, staff videos     Frequency -- -- Add 3 additional days to program exercise sessions.     Initial Home Exercises  Provided -- -- 07/30/22       Oxygen   Maintain Oxygen Saturation -- 88% or higher --              Exercise Comments:   Exercise Comments     Row Name 07/16/22 1055           Exercise Comments First full day of exercise!  Patient was oriented to gym and equipment including functions, settings, policies, and procedures.  Patient's individual exercise prescription and treatment plan were reviewed.  All starting workloads were established based on the results of the 6 minute walk test done at initial orientation visit.  The plan for exercise progression was also introduced and progression will be customized based on patient's performance and goals.                Exercise Goals and Review:   Exercise Goals     Row Name 07/01/22 1623             Exercise Goals   Increase Physical Activity Yes       Intervention Provide advice, education, support and counseling about physical activity/exercise needs.;Develop an individualized exercise prescription for aerobic and resistive training based on initial evaluation findings, risk stratification, comorbidities and participant's personal goals.       Expected Outcomes Short Term: Attend rehab on a regular basis to increase amount of physical activity.;Long Term: Add in home exercise to make exercise part of routine and to increase amount of physical activity.;Long Term: Exercising regularly at least 3-5 days a week.       Increase Strength and Stamina Yes       Intervention Provide advice, education, support and counseling about physical activity/exercise needs.;Develop an individualized exercise prescription for aerobic and resistive training based on initial evaluation findings, risk  stratification, comorbidities and participant's personal goals.       Expected Outcomes Short Term: Increase workloads from initial exercise prescription for resistance, speed, and METs.;Short Term: Perform resistance training exercises routinely during rehab and add in resistance training at home;Long Term: Improve cardiorespiratory fitness, muscular endurance and strength as measured by increased METs and functional capacity (6MWT)       Able to understand and use rate of perceived exertion (RPE) scale Yes       Intervention Provide education and explanation on how to use RPE scale       Expected Outcomes Short Term: Able to use RPE daily in rehab to express subjective intensity level;Long Term:  Able to use RPE to guide intensity level when exercising independently       Able to understand and use Dyspnea scale Yes       Intervention Provide education and explanation on how to use Dyspnea scale       Expected Outcomes Short Term: Able to use Dyspnea scale daily in rehab to express subjective sense of shortness of breath during exertion;Long Term: Able to use Dyspnea scale to guide intensity level when exercising independently       Knowledge and understanding of Target Heart Rate Range (THRR) Yes       Intervention Provide education and explanation of THRR including how the numbers were predicted and where they are located for reference       Expected Outcomes Short Term: Able to state/look up THRR;Short Term: Able to use daily as guideline for intensity in rehab;Long Term: Able to use THRR to govern intensity when exercising independently       Able to  check pulse independently Yes       Intervention Provide education and demonstration on how to check pulse in carotid and radial arteries.;Review the importance of being able to check your own pulse for safety during independent exercise       Expected Outcomes Short Term: Able to explain why pulse checking is important during independent  exercise;Long Term: Able to check pulse independently and accurately       Understanding of Exercise Prescription Yes       Intervention Provide education, explanation, and written materials on patient's individual exercise prescription       Expected Outcomes Short Term: Able to explain program exercise prescription;Long Term: Able to explain home exercise prescription to exercise independently                Exercise Goals Re-Evaluation :  Exercise Goals Re-Evaluation     Row Name 07/16/22 1056 07/17/22 1151 07/30/22 1016         Exercise Goal Re-Evaluation   Exercise Goals Review Increase Physical Activity;Able to understand and use rate of perceived exertion (RPE) scale;Knowledge and understanding of Target Heart Rate Range (THRR);Understanding of Exercise Prescription;Increase Strength and Stamina;Able to check pulse independently Increase Physical Activity;Increase Strength and Stamina;Understanding of Exercise Prescription Increase Physical Activity;Increase Strength and Stamina;Understanding of Exercise Prescription     Comments Reviewed RPE scale, THR and program prescription with pt today.  Pt voiced understanding and was given a copy of goals to take home. Leopoldo is off to a good start with rehab. He finished 1 day so far and was able to follow his initial exercise prescription. He got to walk on the treadmill at 1.0 mph which may be a challenge for him down the road as he has chronic knee pain. We will continue to monitor as he progresses in the program. Hinton is off to a good start in rehab.  He had an episode of low blood pressures and dizziness last Thursday.  He has not noticed much difference yet, but he has only attended 4 sessions.  Reviewed home exercise with pt today.  Pt plans to walk and use weights at home for exercise.  We also talked about using our staff videos as well. Reviewed THR, pulse, RPE, sign and symptoms, pulse oximetery and when to call 911 or MD.  Also  discussed weather considerations and indoor options.  Pt voiced understanding.     Expected Outcomes Short: Use RPE daily to regulate intensity.  Long: Follow program prescription in THR. Short: Continue following initial exercise prescription Long: Build up overall strength and stamina Short: Start to add in walking at home Long: Continue to build stamina              Discharge Exercise Prescription (Final Exercise Prescription Changes):  Exercise Prescription Changes - 07/30/22 1000       Home Exercise Plan   Plans to continue exercise at Home (comment)   walking, weights, staff videos   Frequency Add 3 additional days to program exercise sessions.    Initial Home Exercises Provided 07/30/22             Nutrition:  Target Goals: Understanding of nutrition guidelines, daily intake of sodium <1510m, cholesterol <209m calories 30% from fat and 7% or less from saturated fats, daily to have 5 or more servings of fruits and vegetables.  Education: All About Nutrition: -Group instruction provided by verbal, written material, interactive activities, discussions, models, and posters to present general guidelines for heart  healthy nutrition including fat, fiber, MyPlate, the role of sodium in heart healthy nutrition, utilization of the nutrition label, and utilization of this knowledge for meal planning. Follow up email sent as well. Written material given at graduation. Flowsheet Row Cardiac Rehab from 07/25/2022 in [facility] Cardiac and Pulmonary Rehab  Date 07/18/22  Educator [facility]  Instruction Review Code 1- Verbalizes Understanding       Biometrics:  Pre Biometrics - 07/01/22 1623       Pre Biometrics   Height 6' (1.829 m)    Weight 244 lb 3.2 oz (110.8 kg)    Waist Circumference 47.5 inches    Hip Circumference 41 inches    Waist to Hip Ratio 1.16 %    BMI (Calculated) 33.11    Single Leg Stand 30 seconds              Nutrition Therapy Plan and Nutrition Goals:   Nutrition Therapy & Goals - 07/01/22 1509       Nutrition Therapy   RD appointment deferred Yes   Patient would not like to meet with RD at this time, will continue to follow up.     Personal Nutrition Goals   Nutrition Goal Patient would not like to meet with RD at this time, will continue to follow up.             Nutrition Assessments:  MEDIFICTS Score Key: ?70 Need to make dietary changes  40-70 Heart Healthy Diet ? 40 Therapeutic Level Cholesterol Diet  Flowsheet Row Cardiac Rehab from 07/01/2022 in [facility] Cardiac and Pulmonary Rehab  Picture Your Plate Total Score on Admission 73      Picture Your Plate Scores: <02 Unhealthy dietary pattern with much room for improvement. 41-50 Dietary pattern unlikely to meet recommendations for good health and room for improvement. 51-60 More healthful dietary pattern, with some room for improvement.  >60 Healthy dietary pattern, although there may be some specific behaviors that could be improved.    Nutrition Goals Re-Evaluation:  Nutrition Goals Re-Evaluation     Arthur Name 07/30/22 1027 08/06/22 1145           Goals   Nutrition Goal Patient would not like to meet with RD at this time, will continue to follow up. Patient would not like to meet with RD at this time, will continue to follow up.      Comment Fernie still declines appt.  He is feeling confident in his diet.  He is good about watching his sodium and sugar intake.  He tries to balance his meals.  He eats a lot of salads for lunch to get in a variety of vegetables.  He is trying to get in enough protein with fish and chicken most days. Tory wanted to discuss foods that would help with his BG as well as some breakfast ideas. Discussed nutrition considerations with T2DM and heart health - fiber, protein, and fat with most meals and snacks can be helpful to be mindful of including. Tyshon reports not being able to tolerate milk even lactaid or plant based milks.  Breakfast ideas can include omelette with non-starchy vegetables and whole grain toast, homemade breakfast burrito (eggs, black beans, non-starchy vegetables like tomatoes, onions, peppers, and spinach, some cheese on a whole wheat tortilla), parfait (greek yogurt, frozen berries, sliced almonds, lower sugar granola or homemade granola), oatmeal (adding in greek yogurt or peanut butter for extra protein, berries, and nuts/seeds), grits (eggs, non-starchy vegetables, some cheese), smoothies (berries, part  of a banana, fat like avocado or peanut butter, protein like peanut butter, greek yogurt, or silken tofu).      Expected Outcome Short: Continue to watch sodium Long: COnitnue to get in a good variety. ST: try out breakfast ideas LT: manage A1C <7, limit saturated fat <16g, limit sodium <2g.               Nutrition Goals Discharge (Final Nutrition Goals Re-Evaluation):  Nutrition Goals Re-Evaluation - 08/06/22 1145       Goals   Nutrition Goal Patient would not like to meet with RD at this time, will continue to follow up.    Comment Parv wanted to discuss foods that would help with his BG as well as some breakfast ideas. Discussed nutrition considerations with T2DM and heart health - fiber, protein, and fat with most meals and snacks can be helpful to be mindful of including. Bryn reports not being able to tolerate milk even lactaid or plant based milks. Breakfast ideas can include omelette with non-starchy vegetables and whole grain toast, homemade breakfast burrito (eggs, black beans, non-starchy vegetables like tomatoes, onions, peppers, and spinach, some cheese on a whole wheat tortilla), parfait (greek yogurt, frozen berries, sliced almonds, lower sugar granola or homemade granola), oatmeal (adding in greek yogurt or peanut butter for extra protein, berries, and nuts/seeds), grits (eggs, non-starchy vegetables, some cheese), smoothies (berries, part of a banana, fat like avocado or peanut  butter, protein like peanut butter, greek yogurt, or silken tofu).    Expected Outcome ST: try out breakfast ideas LT: manage A1C <7, limit saturated fat <16g, limit sodium <2g.             Psychosocial: Target Goals: Acknowledge presence or absence of significant depression and/or stress, maximize coping skills, provide positive support system. Participant is able to verbalize types and ability to use techniques and skills needed for reducing stress and depression.   Education: Stress, Anxiety, and Depression - Group verbal and visual presentation to define topics covered.  Reviews how body is impacted by stress, anxiety, and depression.  Also discusses healthy ways to reduce stress and to treat/manage anxiety and depression.  Written material given at graduation.   Education: Sleep Hygiene -Provides group verbal and written instruction about how sleep can affect your health.  Define sleep hygiene, discuss sleep cycles and impact of sleep habits. Review good sleep hygiene tips.    Initial Review & Psychosocial Screening:  Initial Psych Review & Screening - 06/20/22 1443       Initial Review   Current issues with Current Psychotropic Meds;Current Anxiety/Panic      Family Dynamics   Good Support System? Yes    Comments His anxiety stems from him trying to get his disability. He takes some medication for his anxiety to help him cope. He has a good family support system and can look to his dad for support.      Barriers   Psychosocial barriers to participate in program The patient should benefit from training in stress management and relaxation.      Screening Interventions   Interventions Encouraged to exercise;To provide support and resources with identified psychosocial needs;Provide feedback about the scores to participant    Expected Outcomes Short Term goal: Utilizing psychosocial counselor, staff and physician to assist with identification of specific Stressors or current  issues interfering with healing process. Setting desired goal for each stressor or current issue identified.;Long Term Goal: Stressors or current issues are controlled or eliminated.;Short  Term goal: Identification and review with participant of any Quality of Life or Depression concerns found by scoring the questionnaire.;Long Term goal: The participant improves quality of Life and PHQ9 Scores as seen by post scores and/or verbalization of changes             Quality of Life Scores:   Quality of Life - 07/01/22 1624       Quality of Life   Select Quality of Life      Quality of Life Scores   Health/Function Pre 11.57 %    Socioeconomic Pre 21 %    Psych/Spiritual Pre 18 %    Family Pre 21 %    GLOBAL Pre 15.23 %            Scores of 19 and below usually indicate a poorer quality of life in these areas.  A difference of  2-3 points is a clinically meaningful difference.  A difference of 2-3 points in the total score of the Quality of Life Index has been associated with significant improvement in overall quality of life, self-image, physical symptoms, and general health in studies assessing change in quality of life.  PHQ-9: Review Flowsheet  More data exists      07/30/2022 07/01/2022 05/31/2022 05/23/2022 05/09/2022  Depression screen PHQ 2/9  Decreased Interest _0 Down, Depressed, Hopeless _1 PHQ - 2 Score _2 Altered sleeping _3 Tired, decreased energy _4 Change in appetite _5 Feeling bad or failure about yourself  0 _6 Trouble concentrating 1 0 0 1 2  Moving slowly or fidgety/restless 0 0 1 0 0  Suicidal thoughts 1 1 0 0 1  PHQ-9 Score _7 Difficult doing work/chores Somewhat difficult Somewhat difficult Somewhat difficult Somewhat difficult Very difficult   Interpretation of Total Score  Total Score Depression Severity:  1-4 = Minimal depression, 5-9 = Mild depression, 10-14 = Moderate depression,  15-19 = Moderately severe depression, 20-27 = Severe depression   Psychosocial Evaluation and Intervention:  Psychosocial Evaluation - 06/20/22 1444       Psychosocial Evaluation & Interventions   Interventions Encouraged to exercise with the program and follow exercise prescription;Stress management education;Relaxation education    Comments His anxiety stems from him trying to get his disability. He takes some medication for his anxiety to help him cope. He has a good family support system and can look to his dad for support.    Expected Outcomes Short: Start [facility] to help with mood. Long: Maintain a healthy mental state    Continue Psychosocial Services  Follow up required by staff             Psychosocial Re-Evaluation:  Psychosocial Re-Evaluation     [place] Name 07/30/22 1020             Psychosocial Re-Evaluation   Current issues with Current Stress Concerns;Current Anxiety/Panic;Current Depression       Comments Fredderick is doing well in rehab.  He is still struggling with anxiety and depression.  He had a scare last week with dizziness and low blood pressures.  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 His PHQ did improve by one point.  He still has fleeting thoughts about death, but he wants to be here and wants  to keep living his life.  He takes each day one at a time. His sleep is erratic as some night hes up all night, others out in no time.  He is also struggling with appetite as his medications are a suppressant.       Expected Outcomes Short: Continue to look for positive and manage health Long: Use exercise for a mental boost                Psychosocial Discharge (Final Psychosocial Re-Evaluation):  Psychosocial Re-Evaluation - 07/30/22 1020       Psychosocial Re-Evaluation   Current issues with Current Stress Concerns;Current Anxiety/Panic;Current Depression    Comments Damyan is doing well in rehab.  He is still struggling with anxiety  and depression.  He had a scare last week with dizziness and low blood pressures.  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 His PHQ did improve by one point.  He still has fleeting thoughts about death, but he wants to be here and wants to keep living his life.  He takes each day one at a time. His sleep is erratic as some night hes up all night, others out in no time.  He is also struggling with appetite as his medications are a suppressant.    Expected Outcomes Short: Continue to look for positive and manage health Long: Use exercise for a mental boost             Vocational Rehabilitation: Provide vocational rehab assistance to qualifying candidates.   Vocational Rehab Evaluation & Intervention:   Education: Education Goals: Education classes will be provided on a variety of topics geared toward better understanding of heart health and risk factor modification. Participant will state understanding/return demonstration of topics presented as noted by education test scores.  Learning Barriers/Preferences:  Learning Barriers/Preferences - 06/20/22 1442       Learning Barriers/Preferences   Learning Barriers None    Learning Preferences Skilled Demonstration             General Cardiac Education Topics:  AED/CPR: - Group verbal and written instruction with the use of models to demonstrate the basic use of the AED with the basic ABC's of resuscitation.   Anatomy and Cardiac Procedures: - Group verbal and visual presentation and models provide information about basic cardiac anatomy and function. Reviews the testing methods done to diagnose heart disease and the outcomes of the test results. Describes the treatment choices: Medical Management, Angioplasty, or Coronary Bypass Surgery for treating various heart conditions including Myocardial Infarction, Angina, Valve Disease, and Cardiac Arrhythmias.  Written material given at graduation. Flowsheet Row Cardiac  Rehab from 07/25/2022 in [facility] Cardiac and Pulmonary Rehab  Date 07/25/22  Educator SB  Instruction Review Code 1- Verbalizes Understanding       Medication Safety: - Group verbal and visual instruction to review commonly prescribed medications for heart and lung disease. Reviews the medication, class of the drug, and side effects. Includes the steps to properly store meds and maintain the prescription regimen.  Written material given at graduation.   Intimacy: - Group verbal instruction through game format to discuss how heart and lung disease can affect sexual intimacy. Written material given at graduation..   Know Your Numbers and Heart Failure: - Group verbal and visual instruction to discuss disease risk factors for cardiac and pulmonary disease and treatment options.  Reviews associated critical values for Overweight/Obesity, Hypertension, Cholesterol, and Diabetes.  Discusses basics of heart  failure: signs/symptoms and treatments.  Introduces Heart Failure Zone chart for action plan for heart failure.  Written material given at graduation. Flowsheet Row Cardiac Rehab from 07/25/2022 in [facility] Cardiac and Pul[facility] Rehab  Education need identified 07/01/22       Infection Prevention: - Provides verbal and written material to individual with discussion of infection control including proper hand washing and proper equipment cleaning during exercise session. Flowsheet Row Cardiac Rehab from 07/25/2022 in [facility] Cardiac and Pulmonary Rehab  Date 07/01/22  Educator [facility]  Instruction Review Code 1- Verbalizes Understanding       Falls Prevention: - Provides verbal and written material to individual with discussion of falls prevention and safety. Flowsheet Row Cardiac Rehab from 07/25/2022 in [facility] Cardiac and Pulmonary Rehab  Date 07/01/22  Educator [facility]  Instruction Review Code 1- Verbalizes Understanding       Other: -Provides group and verbal instruction on various topics (see  comments)   Knowledge Questionnaire Score:  Knowledge Questionnaire Score - 07/01/22 1625       Knowledge Questionnaire Score   Pre Score 24/26             Core Components/Risk Factors/Patient Goals at Admission:  Personal Goals and Risk Factors at Admission - 07/01/22 1625       Core Components/Risk Factors/Patient Goals on Admission    Weight Management Yes;Weight Loss;Obesity    Intervention Weight Management: Develop a combined nutrition and exercise program designed to reach desired caloric intake, while maintaining appropriate intake of nutrient and fiber, sodium and fats, and appropriate energy expenditure required for the weight goal.;Weight Management: Provide education and appropriate resources to help participant work on and attain dietary goals.;Weight Management/Obesity: Establish reasonable short term and long term weight goals.;Obesity: Provide education and appropriate resources to help participant work on and attain dietary goals.    Admit Weight 244 lb 8 oz (110.9 kg)    Goal Weight: Short Term 235 lb (106.6 kg)    Goal Weight: Long Term 200 lb (90.7 kg)    Expected Outcomes Short Term: Continue to assess and modify interventions until short term weight is achieved;Long Term: Adherence to nutrition and physical activity/exercise program aimed toward attainment of established weight goal;Weight Loss: Understanding of general recommendations for a balanced deficit meal plan, which promotes 1-2 lb weight loss per week and includes a negative energy balance of [phone] kcal/d;Understanding recommendations for meals to include 15-35% energy as protein, 25-35% energy from fat, 35-60% energy from carbohydrates, less than 259m of dietary cholesterol, 20-35 gm of total fiber daily;Understanding of distribution of calorie intake throughout the day with the consumption of 4-5 meals/snacks    Increase knowledge of respiratory medications and ability to use respiratory devices  properly  Yes    Intervention Provide education and demonstration as needed of appropriate use of medications, inhalers, and oxygen therapy.    Expected Outcomes Short Term: Achieves understanding of medications use. Understands that oxygen is a medication prescribed by physician. Demonstrates appropriate use of inhaler and oxygen therapy.;Long Term: Maintain appropriate use of medications, inhalers, and oxygen therapy.    Diabetes Yes    Intervention Provide education about signs/symptoms and action to take for hypo/hyperglycemia.;Provide education about proper nutrition, including hydration, and aerobic/resistive exercise prescription along with prescribed medications to achieve blood glucose in normal ranges: Fasting glucose 65-99 mg/dL    Expected Outcomes Short Term: Participant verbalizes understanding of the signs/symptoms and immediate care of hyper/hypoglycemia, proper foot care and importance of medication, aerobic/resistive exercise and  nutrition plan for blood glucose control.;Long Term: Attainment of HbA1C < 7%.    Heart Failure Yes    Intervention Provide a combined exercise and nutrition program that is supplemented with education, support and counseling about heart failure. Directed toward relieving symptoms such as shortness of breath, decreased exercise tolerance, and extremity edema.    Expected Outcomes Improve functional capacity of life;Short term: Attendance in program 2-3 days a week with increased exercise capacity. Reported lower sodium intake. Reported increased fruit and vegetable intake. Reports medication compliance.;Short term: Daily weights obtained and reported for increase. Utilizing diuretic protocols set by physician.;Long term: Adoption of self-care skills and reduction of barriers for early signs and symptoms recognition and intervention leading to self-care maintenance.    Hypertension Yes    Intervention Provide education on lifestyle modifcations including regular  physical activity/exercise, weight management, moderate sodium restriction and increased consumption of fresh fruit, vegetables, and low fat dairy, alcohol moderation, and smoking cessation.;Monitor prescription use compliance.    Expected Outcomes Short Term: Continued assessment and intervention until BP is < 140/29m HG in hypertensive participants. < 130/871mHG in hypertensive participants with diabetes, heart failure or chronic kidney disease.;Long Term: Maintenance of blood pressure at goal levels.    Lipids Yes    Intervention Provide education and support for participant on nutrition & aerobic/resistive exercise along with prescribed medications to achieve LDL <7049mHDL >59m26m  Expected Outcomes Short Term: Participant states understanding of desired cholesterol values and is compliant with medications prescribed. Participant is following exercise prescription and nutrition guidelines.;Long Term: Cholesterol controlled with medications as prescribed, with individualized exercise RX and with personalized nutrition plan. Value goals: LDL < 70mg50mL > 40 mg.             Education:Diabetes - Individual verbal and written instruction to review signs/symptoms of diabetes, desired ranges of glucose level fasting, after meals and with exercise. Acknowledge that pre and post exercise glucose checks will be done for 3 sessions at entry of program. [place] [facility]  Date 07/01/22  Educator [facility] Review Code 5- Refused Teaching       Core Components/Risk Factors/Patient Goals Review:   Goals and Risk Factor Review     Row Name 07/30/22 1025             Core Components/Risk Factors/Patient Goals Review   Personal Goals Review Weight Management/Obesity;Hypertension;Improve shortness of breath with ADL's;Diabetes;Heart Failure       Review JohnnMarquavionoing well in rehab.  His weight is trending down as his  medicaitons are supressing his appetite. His sugars are staying well controlled on his medications.  He has not had any shortness of breath or swelling recently.  Only thing, has been that episode of dizziness last week.  Normally, his pressures are doing well.  He checks his sugars and pressures and weights at home daily.       Expected Outcomes Short: COnitnue to manage heart failure Long: Continue to monitor risk factors                Core Components/Risk Factors/Patient Goals at Discharge (Final Review):   Goals and Risk Factor Review - 07/30/22 1025       Core Components/Risk Factors/Patient Goals Review   Personal Goals Review Weight Management/Obesity;Hypertension;Improve shortness of breath with ADL's;Diabetes;Heart Failure    Review JohnnTedrickoing well in rehab.  His weight is trending down as his  medicaitons are supressing his appetite. His sugars are staying well controlled on his medications.  He has not had any shortness of breath or swelling recently.  Only thing, has been that episode of dizziness last week.  Normally, his pressures are doing well.  He checks his sugars and pressures and weights at home daily.    Expected Outcomes Short: COnitnue to manage heart failure Long: Continue to monitor risk factors             ITP Comments:  ITP Comments     Row Name 06/20/22 1441 07/01/22 1555 07/16/22 1055 07/17/22 0754 08/08/22 1110   ITP Comments Virtual Visit completed. Patient informed on EP and RD appointment and 6 Minute walk test. Patient also informed of patient health questionnaires on My Chart. Patient Verbalizes understanding. Visit diagnosis can be found in [facility] 06/14/2022. Completed 6MWT and gym orientation. Initial ITP created and sent for review to Dr. Emily Filbert, Medical Director. First full day of exercise!  Patient was oriented to gym and equipment including functions, settings, policies, and procedures.  Patient's individual exercise prescription and  treatment plan were reviewed.  All starting workloads were established based on the results of the 6 minute walk test done at initial orientation visit.  The plan for exercise progression was also introduced and progression will be customized based on patient's performance and goals. 30 Day review completed. Medical Director ITP review done, changes made as directed, and signed approval by Medical Director.   New to program [place] is having to leave the program early due to financial issues. His home exercise has been reviewed and education folder was given.            Comments: Discharge ITP

## 2022-08-08 NOTE — Progress Notes (Signed)
Discharge Summary   Gabriel [name]. (DOB: [birth year])  Gabriel [name] is having to leave the program early due to financial issues. He has completed 9 of 36 sessions. He has met with the dietician. His home exercise has been reviewed and education folder was given. Guidance given on reaching out to staff if he has questions concerning his exercise routine.    6 Minute Walk     Row Name 07/01/22 1600         6 Minute Walk   Phase Initial     Distance 700 feet     Walk Time 5.75 minutes     # of Rest Breaks 1     MPH 1.38     METS 2.28     RPE 19     Perceived Dyspnea  4     VO2 Peak 7.97     Symptoms No     Resting HR 75 bpm     Resting BP 148/80     Resting Oxygen Saturation  96 %     Exercise Oxygen Saturation  during 6 min walk 96 %     Max Ex. HR 104 bpm     Max Ex. BP 138/80     2 Minute Post BP 124/80

## 2022-08-09 ENCOUNTER — Encounter: Payer: Self-pay | Admitting: Family Medicine

## 2022-08-09 ENCOUNTER — Ambulatory Visit (INDEPENDENT_AMBULATORY_CARE_PROVIDER_SITE_OTHER): Payer: Medicaid Other | Admitting: Family Medicine

## 2022-08-09 VITALS — BP 131/79 | HR 85 | Temp 98.1°F | Wt 242.3 lb

## 2022-08-09 DIAGNOSIS — I25118 Atherosclerotic heart disease of native coronary artery with other forms of angina pectoris: Secondary | ICD-10-CM

## 2022-08-09 DIAGNOSIS — N1832 Chronic kidney disease, stage 3b: Secondary | ICD-10-CM

## 2022-08-09 DIAGNOSIS — E0822 Diabetes mellitus due to underlying condition with diabetic chronic kidney disease: Secondary | ICD-10-CM

## 2022-08-09 DIAGNOSIS — I5022 Chronic systolic (congestive) heart failure: Secondary | ICD-10-CM | POA: Diagnosis not present

## 2022-08-09 DIAGNOSIS — Z794 Long term (current) use of insulin: Secondary | ICD-10-CM

## 2022-08-09 NOTE — Assessment & Plan Note (Signed)
Chronic, weight stable DM improved BP stable Goal of <130/<80 today is borderline Followed by cards

## 2022-08-09 NOTE — Assessment & Plan Note (Signed)
Chronic, improved HTN control with use of Coreg 3.125 mg, Farxiga 10,  HLD control with Tricor 145, Crestor 40 Fluid control with Furoscix 80 PRN, Spiro 25, Torsemide 40 mg  DM control with Basaglar 36 u qHS Congratulated on efforts!

## 2022-08-09 NOTE — Assessment & Plan Note (Signed)
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 Smaller meal size, adding snacks. Choosing water as drink of choice and increasing purposeful exercise. HTN control with use of Coreg 3.125 mg, Farxiga 10,  HLD control with Tricor 145, Crestor 40 Fluid control with Furoscix 80 PRN, Spiro 25, Torsemide 40 mg  DM control with Basaglar 36 u qHS

## 2022-08-10 LAB — COMPREHENSIVE METABOLIC PANEL
ALT: 21 IU/L (ref 0–44)
AST: 20 IU/L (ref 0–40)
Albumin/Globulin Ratio: 1.6 (ref 1.2–2.2)
Albumin: 5 g/dL — ABNORMAL HIGH (ref 3.8–4.9)
Alkaline Phosphatase: 75 IU/L (ref 44–121)
BUN/Creatinine Ratio: 18 (ref 9–20)
BUN: 27 mg/dL — ABNORMAL HIGH (ref 6–24)
Bilirubin Total: 1.1 mg/dL (ref 0.0–1.2)
CO2: 25 mmol/L (ref 20–29)
Calcium: 10.6 mg/dL — ABNORMAL HIGH (ref 8.7–10.2)
Chloride: 98 mmol/L (ref 96–106)
Creatinine, Ser: 1.54 mg/dL — ABNORMAL HIGH (ref 0.76–1.27)
Globulin, Total: 3.1 g/dL (ref 1.5–4.5)
Glucose: 158 mg/dL — ABNORMAL HIGH (ref 70–99)
Potassium: 4 mmol/L (ref 3.5–5.2)
Sodium: 143 mmol/L (ref 134–144)
Total Protein: 8.1 g/dL (ref 6.0–8.5)
eGFR: 54 mL/min/{1.73_m2} — ABNORMAL LOW (ref >59)

## 2022-08-10 LAB — LIPID PANEL
Chol/HDL Ratio: 6 ratio — ABNORMAL HIGH (ref 0.0–5.0)
Cholesterol, Total: 143 mg/dL (ref 100–199)
HDL: 24 mg/dL — ABNORMAL LOW (ref >39)
LDL Chol Calc (NIH): 62 mg/dL (ref 0–99)
Triglycerides: 367 mg/dL — ABNORMAL HIGH (ref 0–149)
VLDL Cholesterol Cal: 57 mg/dL — ABNORMAL HIGH (ref 5–40)

## 2022-08-13 IMAGING — CR DG CHEST 2V
2 series · 2 of 2 positions shown · non-contrast
Comparison: Comparison 02/14/2021

CLINICAL DATA: Shortness of breath.

EXAM:
CHEST - 2 VIEW

[chest pa]
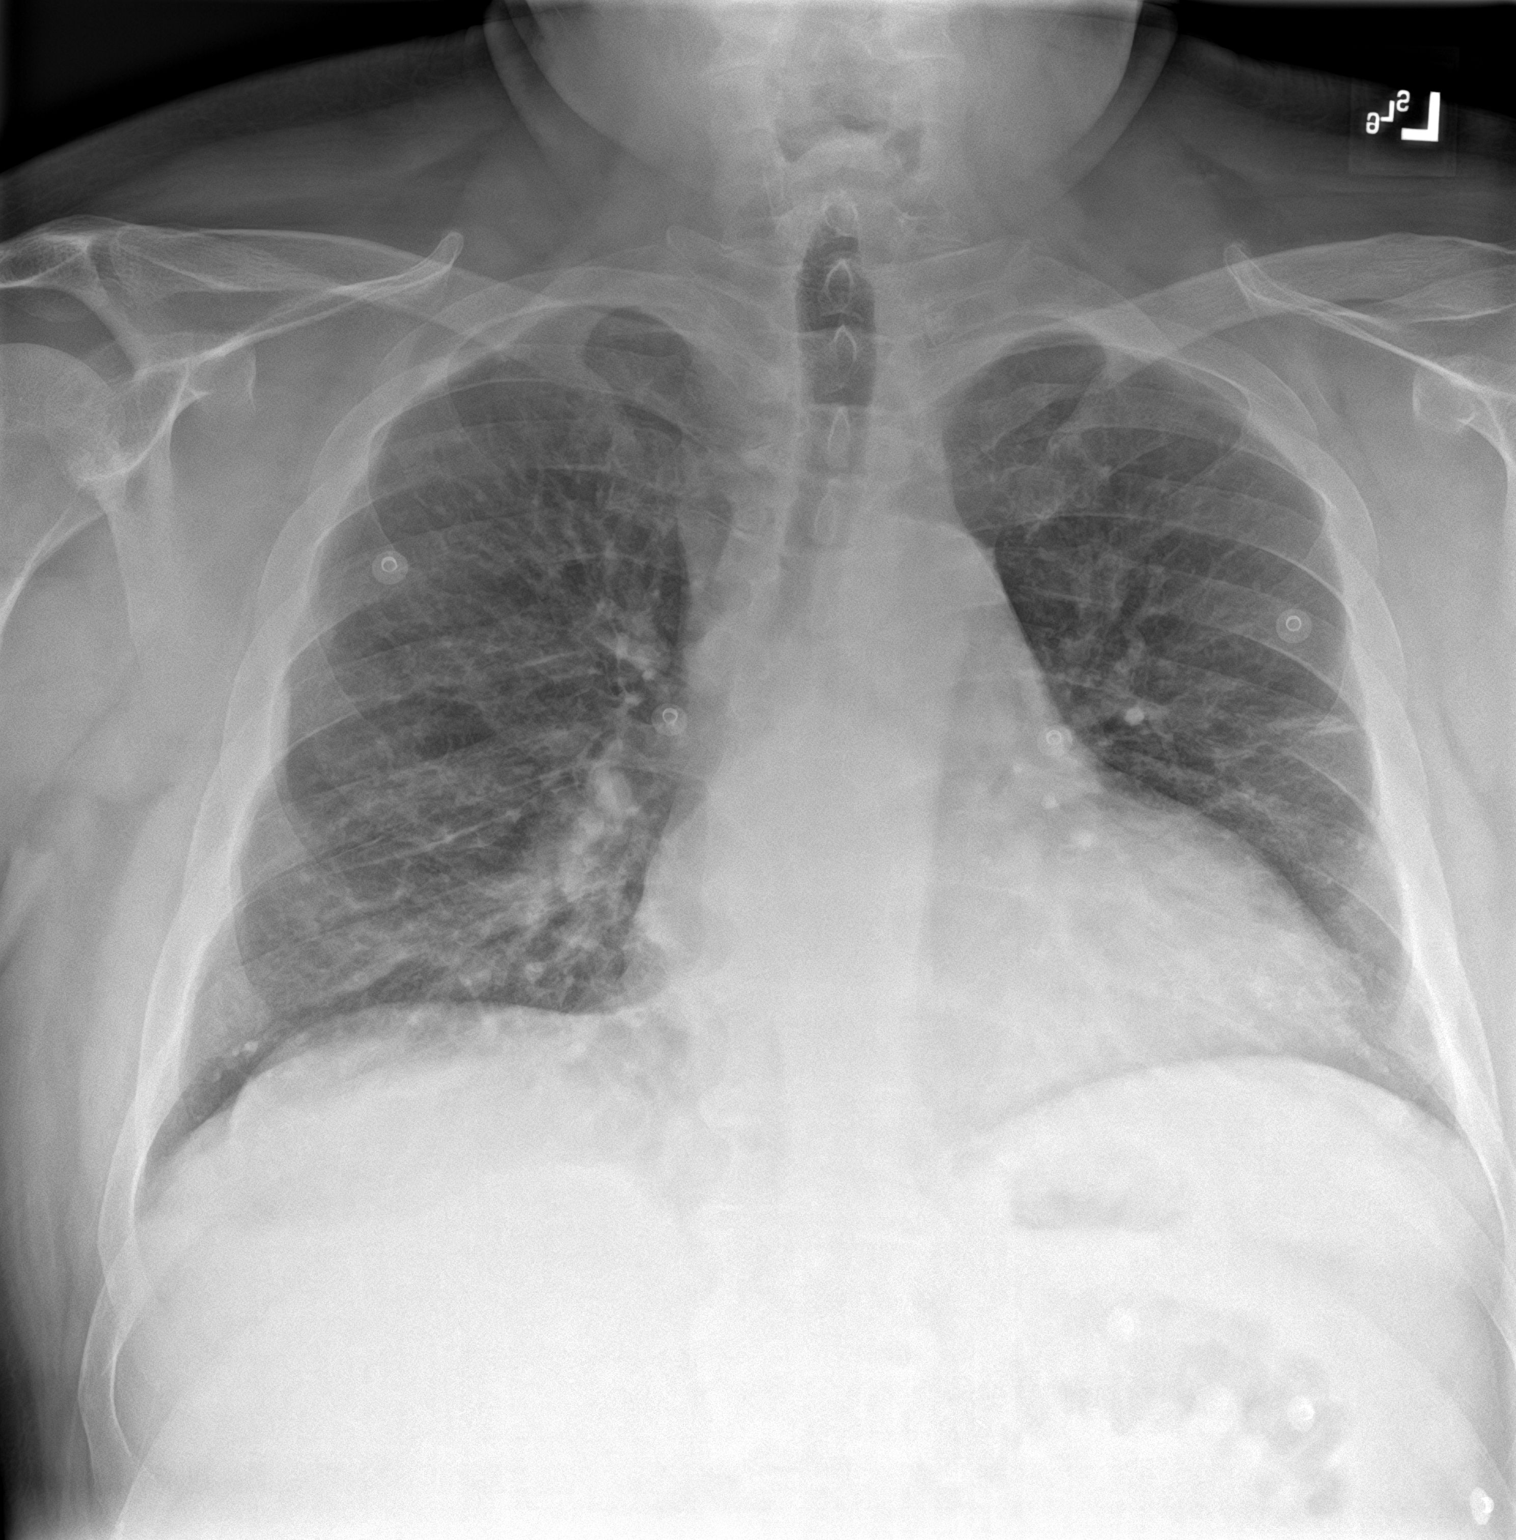

[chest lat]
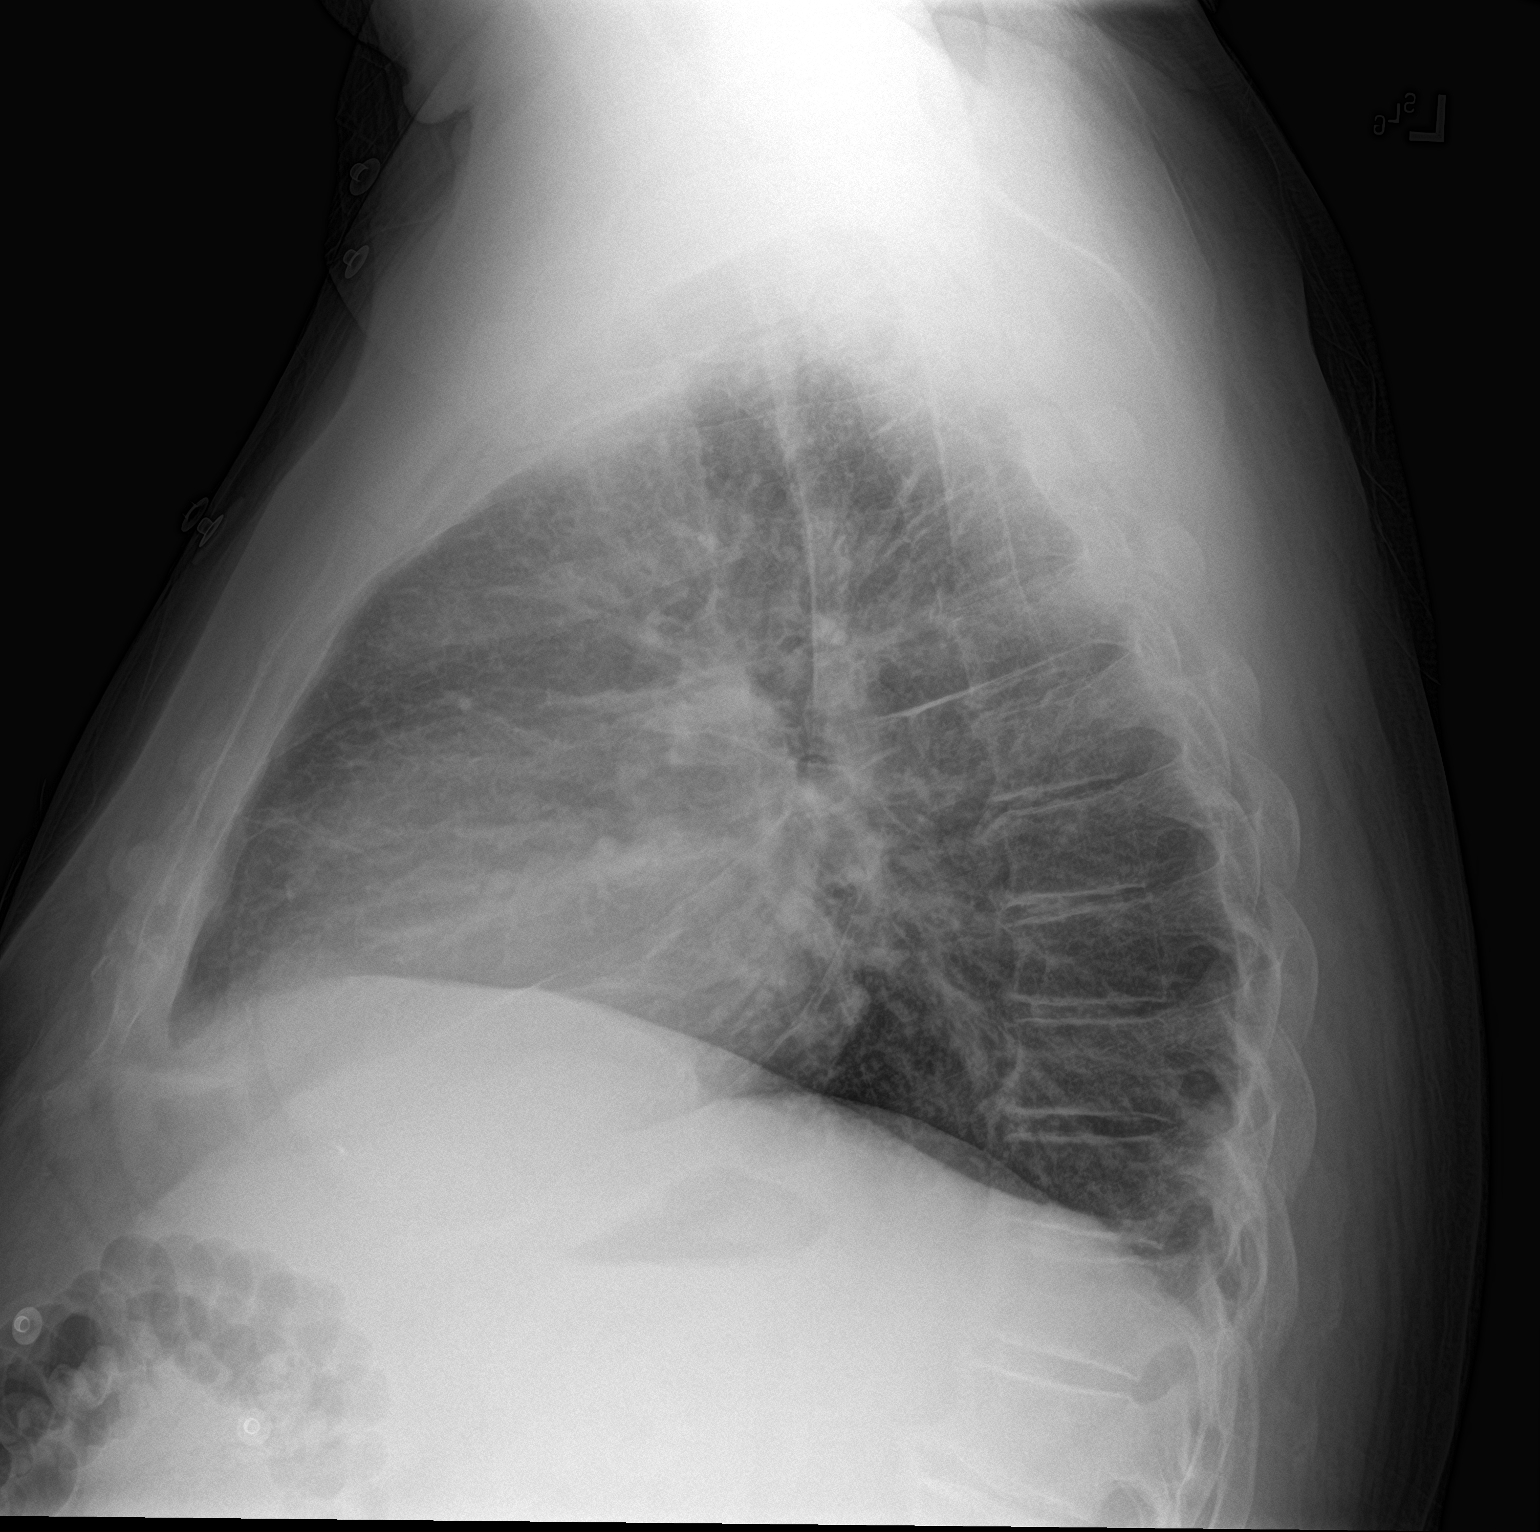

[2 of 2 positions shown; findings below may reference images not displayed]

FINDINGS: Heart size is mildly prominent but unchanged. Stable linear density
in the left mid lung could represent scar or atelectasis. Coarse
lung markings are unchanged. No overt pulmonary edema. No focal
airspace disease. No significant pleural fluid. No acute bone
abnormality.
IMPRESSION: No active cardiopulmonary disease.

## 2022-08-13 IMAGING — US US ABDOMEN LIMITED
1 series · 14 of 25 positions shown · non-contrast
Comparison: None.

CLINICAL DATA: Right upper quadrant pain for 1 week.

EXAM:
ULTRASOUND ABDOMEN LIMITED RIGHT UPPER QUADRANT

[Series 1: us abdomen limited ruq (liver/gb) · 14 of 41 slices shown]
[im 1/41]
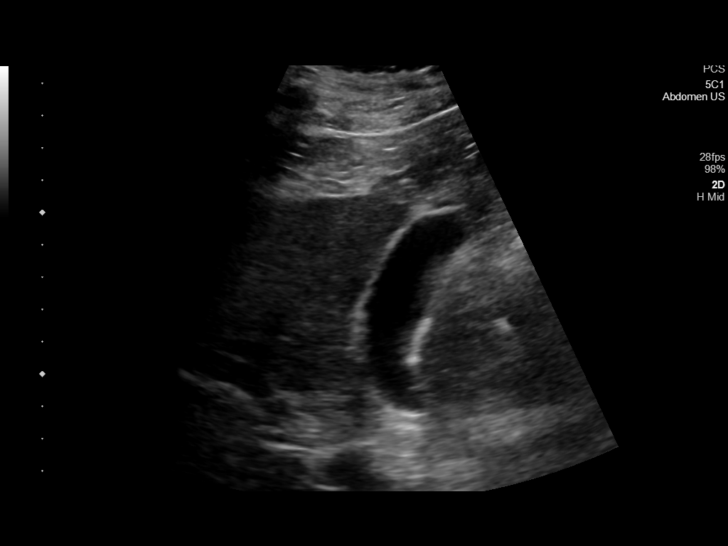
[im 4/41]
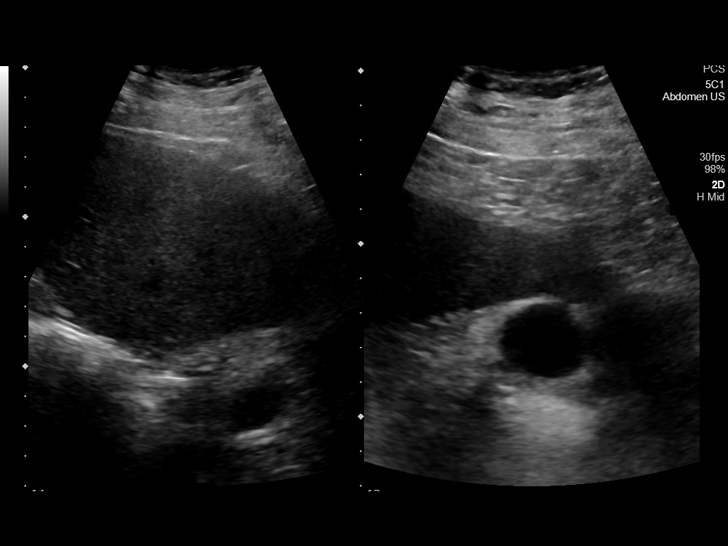
[im 7/41]
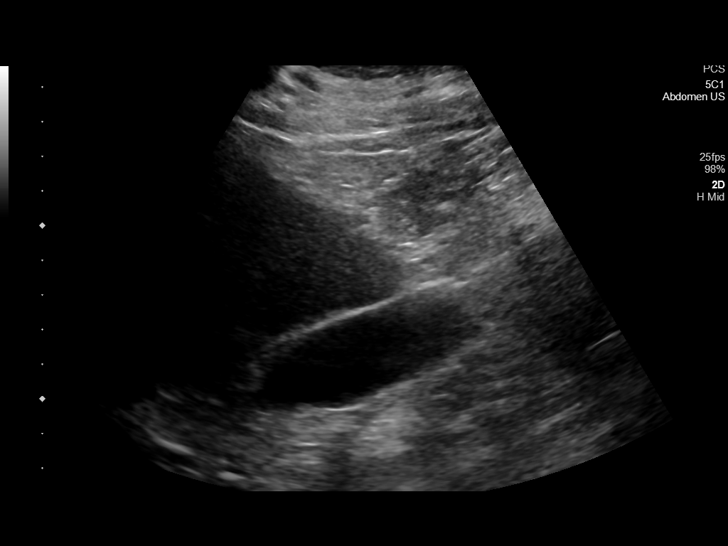
[im 11/41]
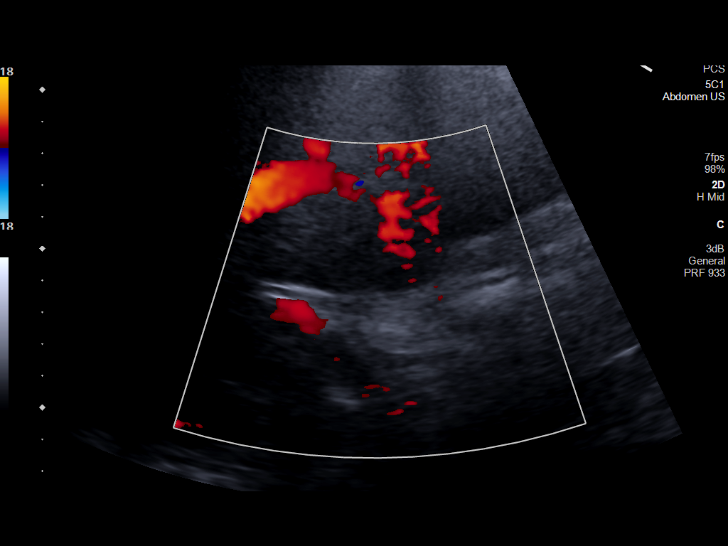
[im 14/41]
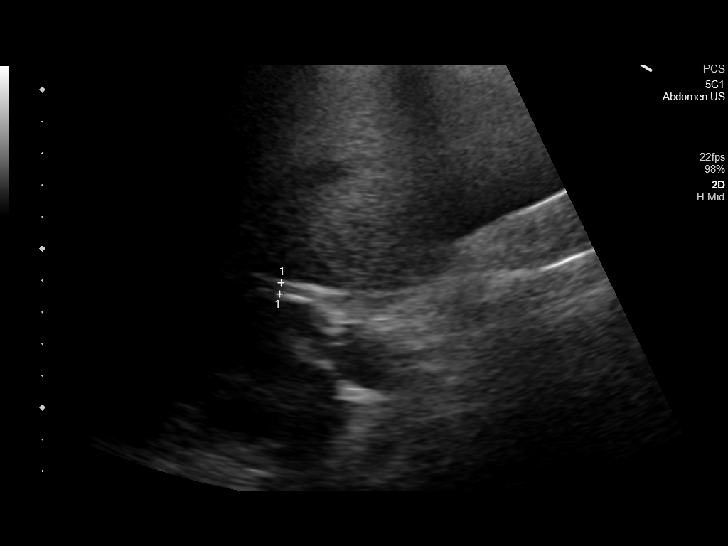
[im 16/41]
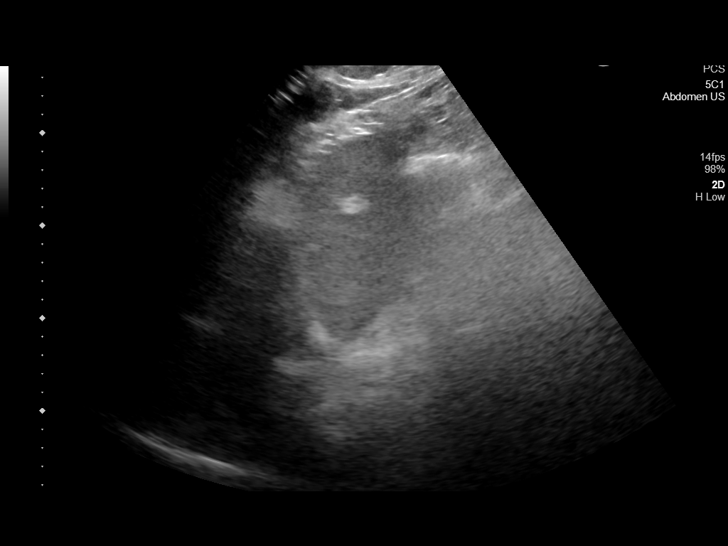
[im 19/41]
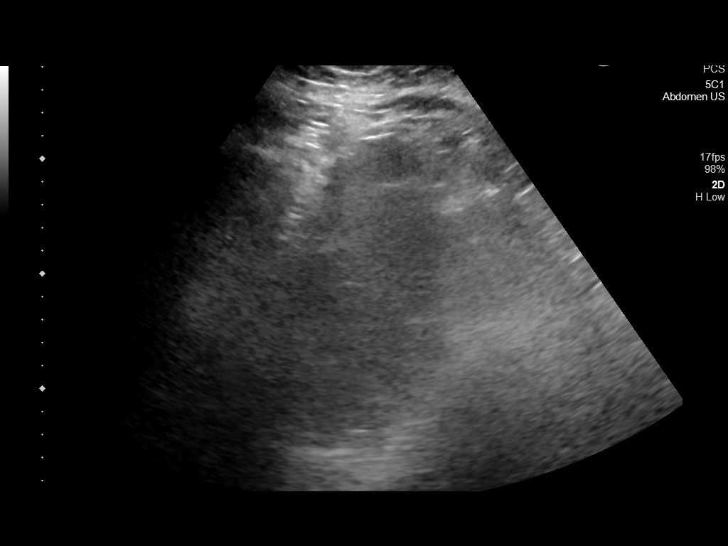
[im 22/41]
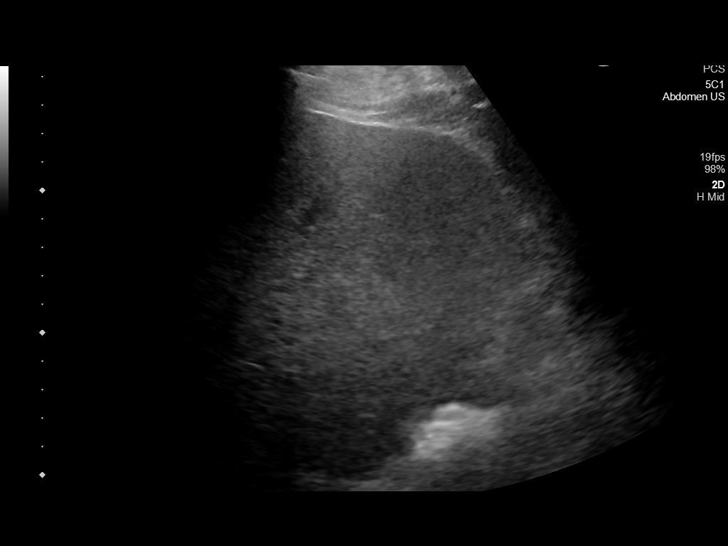
[im 26/41]
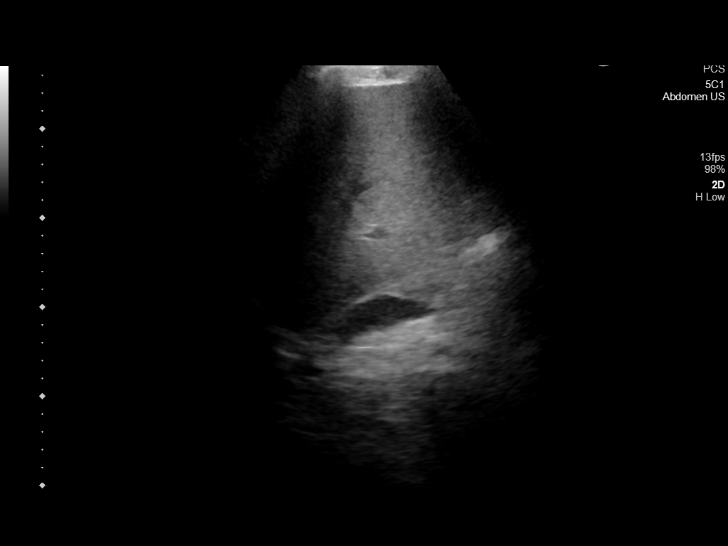
[im 27/41]
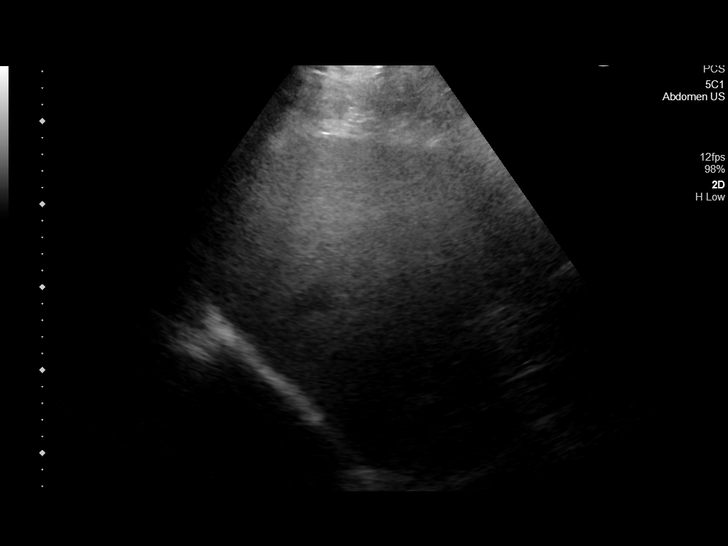
[im 31/41]
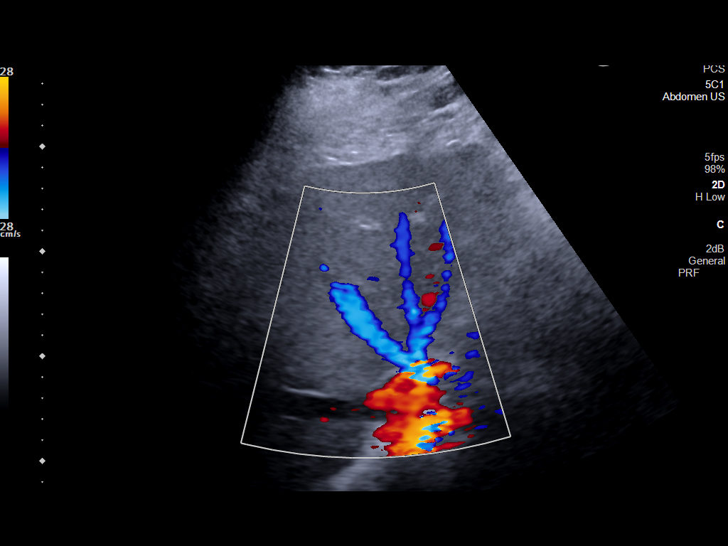
[im 34/41]
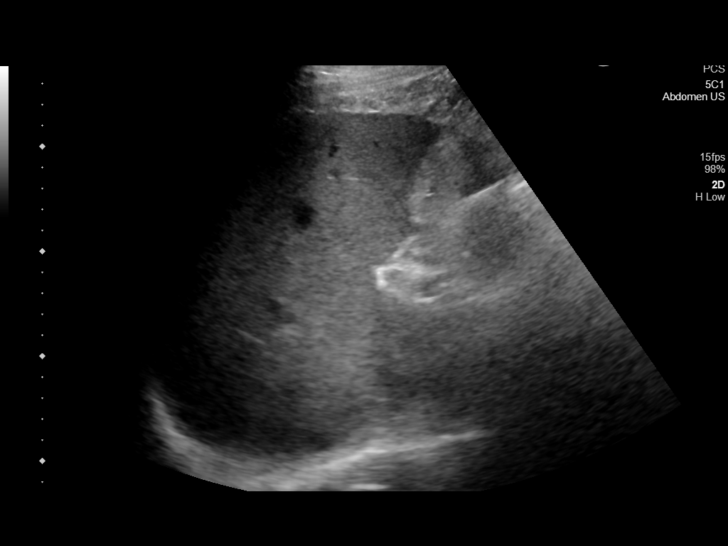
[im 37/41]
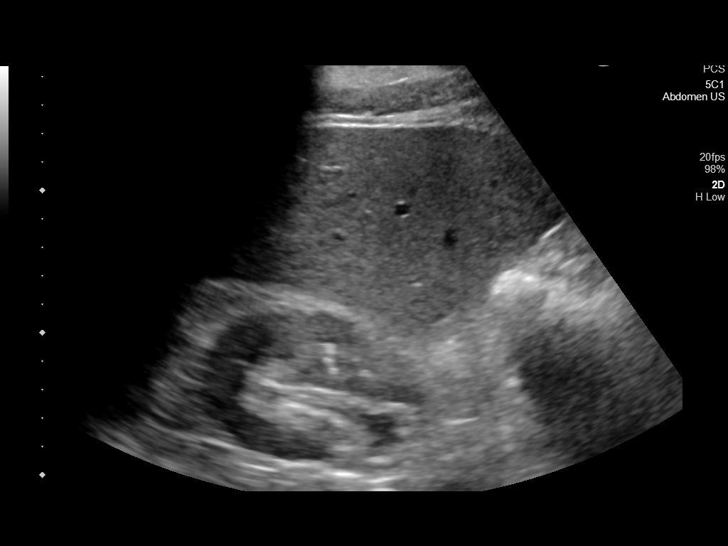
[im 41/41]
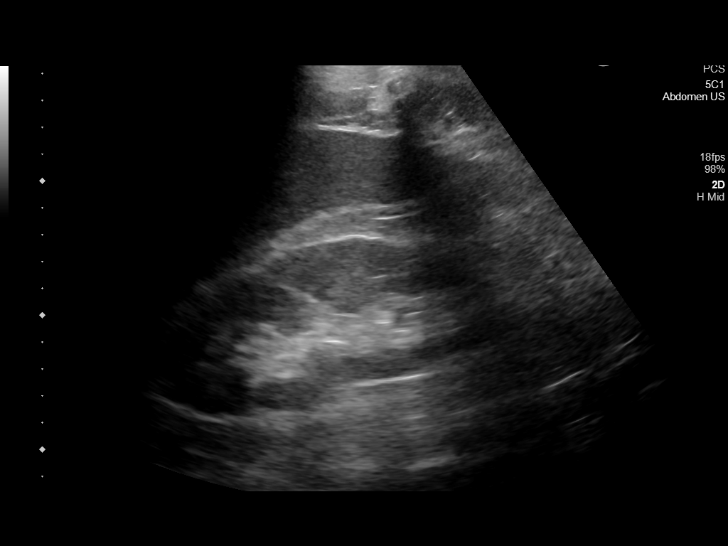

[14 of 25 positions shown; findings below may reference images not displayed]

FINDINGS: Gallbladder:

No gallstones or wall thickening visualized. No sonographic Murphy
sign noted by sonographer.

Common bile duct:

Diameter: 3-4 mm

Liver:

Normal overall size. Diffuse increased parenchymal echogenicity with
decreased through transmission of the sound beam. This limits
assessment of the liver. No mass. Portal vein is patent on color
Doppler imaging with normal direction of blood flow towards the
liver.

Other: None.
IMPRESSION: 1. No acute findings.  Normal gallbladder.  No bile duct dilation.
2. Hepatic steatosis.

## 2022-08-13 NOTE — Progress Notes (Signed)
Labs remain stable. Continue to work on lower fat, heart healthy diet to lower triglycerides.

## 2022-08-14 ENCOUNTER — Ambulatory Visit: Payer: Medicaid Other

## 2022-08-14 ENCOUNTER — Ambulatory Visit
Admission: RE | Admit: 2022-08-14 | Discharge: 2022-08-14 | Disposition: A | Discharge status: Home / Self Care | Payer: Medicaid Other | Source: Ambulatory Visit | Attending: Internal Medicine | Admitting: Internal Medicine

## 2022-08-14 DIAGNOSIS — I2510 Atherosclerotic heart disease of native coronary artery without angina pectoris: Secondary | ICD-10-CM | POA: Insufficient documentation

## 2022-08-14 DIAGNOSIS — I252 Old myocardial infarction: Secondary | ICD-10-CM | POA: Diagnosis not present

## 2022-08-14 DIAGNOSIS — J449 Chronic obstructive pulmonary disease, unspecified: Secondary | ICD-10-CM | POA: Insufficient documentation

## 2022-08-14 DIAGNOSIS — I5022 Chronic systolic (congestive) heart failure: Secondary | ICD-10-CM

## 2022-08-14 DIAGNOSIS — I351 Nonrheumatic aortic (valve) insufficiency: Secondary | ICD-10-CM | POA: Diagnosis not present

## 2022-08-14 DIAGNOSIS — I110 Hypertensive heart disease with heart failure: Secondary | ICD-10-CM | POA: Insufficient documentation

## 2022-08-14 LAB — ECHOCARDIOGRAM COMPLETE
AR max vel: 2.56 cm2
AV Area VTI: 2.97 cm2
AV Area mean vel: 2.41 cm2
AV Mean grad: 3 mmHg
AV Peak grad: 4.6 mmHg
Ao pk vel: 1.07 m/s
Area-P 1/2: 7.66 cm2
Calc EF: 42.3 %
S' Lateral: 3.8 cm
Single Plane A2C EF: 40.3 %
Single Plane A4C EF: 46.6 %

## 2022-08-14 MED ORDER — PERFLUTREN LIPID MICROSPHERE
1.0000 mL | INTRAVENOUS | Status: AC | PRN
  Administered 2022-08-14: 2 mL via INTRAVENOUS

## 2022-08-14 NOTE — Progress Notes (Signed)
*  PRELIMINARY RESULTS* Echocardiogram 2D Echocardiogram has been performed.  Gabriel [name] 08/14/2022, 10:40 AM

## 2022-08-15 ENCOUNTER — Other Ambulatory Visit: Payer: Medicaid Other

## 2022-08-15 NOTE — Patient Instructions (Signed)
Visit Information  Gabriel [name] was given information about Medicaid Managed Care team care coordination services as a part of their [place] [employer] benefit. Gabriel [name]. verbally consentedto engagement with the [facility] Managed Care team.   If you are experiencing a medical emergency, please call 911 or report to your local emergency department or urgent care.   If you have a non-emergency medical problem during routine business hours, please contact your provider's office and ask to speak with a nurse.   For questions related to your [facility] health plan, please call: [phone]  OR visit the member homepage at: [email or web address]  If you would like to schedule transportation through your [facility] plan, please call the following number at least 2 days in advance of your appointment: [phone]  If you are experiencing a behavioral health crisis, call the [place] at [phone]). The line is available 24 hours a day, seven days a week.  If you would like help to quit smoking, call 1-800-QUIT-NOW 803-351-9840) OR Espaol: 1-855-Djelo-Ya (7-209-470-9628) o para ms informacin haga clic aqu or Text READY to 200-400 to register via text  Gabriel [name] - following are the goals we discussed in your visit today:   Goals Addressed   None       The  Patient                                              has been provided with contact information for the Managed Medicaid care management team and has been advised to call with any health related questions or concerns.   Mickel Fuchs, BSW, [place] Managed Medicaid Team  [phone]   Following is a copy of your plan of care:  There are no care plans that you recently modified to display for this patient.

## 2022-08-15 NOTE — Patient Outreach (Signed)
Medicaid Managed Care Social Work Note  08/15/2022 Name:  Gabriel [name]. MRN:  [id number] DOB:  [birth year]  Gabriel [name]. is an 51 y.o. year old male who is a primary patient of Gabriel Sprout, FNP.  The Medicaid Managed Care Coordination team was consulted for assistance with:  Transportation Needs  Community Resources   Gabriel [name] was given information about Medicaid Managed Care Coordination team services today. Gabriel [name]. Patient agreed to services and verbal consent obtained.  Engaged with patient  for by telephone forinitial visit in response to referral for case management and/or care coordination services.   Assessments/Interventions:  Review of past medical history, allergies, medications, health status, including review of consultants reports, laboratory and other test data, was performed as part of comprehensive evaluation and provision of chronic care management services.  SDOH: (Social Determinant of Health) assessments and interventions performed: SDOH Interventions    Flowsheet Row Patient Outreach from 08/05/2022 in [facility] from 07/30/2022 in [facility]hab from 07/01/2022 in [facility] Office Visit from 05/23/2022 in [place] ED to Hosp-Admission (Discharged) from 05/13/2022 in [name] Office Visit from 04/25/2022 in [facility]  SDOH Interventions        Food Insecurity Interventions -- -- -- -- Inpatient TOC, Patient Refused  [Patient reported no food insecurities] --  Depression Interventions/Treatment  -- Medication, Counseling, Currently on Treatment Medication Medication  [Provider notified] -- Medication  [Provider notified]  Financial Strain Interventions Other (Comment)  [gas is expensive] -- -- -- -- --     BSW completed a telephone  outreach with patient. He stated that he only needed assistance with some gas for his car to get to his doctors appointments. BSW informed patient with his insurance he can get free transportation. Patient stated he had his card and would get the phone number for the back of the card. Patient stated at this time he does not need resources for food, rent or utilities. Patient stated he would contact BSW if need came.  Advanced Directives Status:  Not addressed in this encounter.  Care Plan                 Allergies  Allergen Reactions   Metformin And Related Nausea And Vomiting   Prednisone Other (See Comments)    Reaction: Hallucinations     Zantac [Ranitidine Hcl] Diarrhea and Nausea Only    Night sweats    Medications Reviewed Today     Reviewed by Gabriel Sprout, FNP (Family Nurse Practitioner) on 08/09/22 at [phone]  Med List Status: <None>   Medication Order Taking? Sig Documenting Provider Last Dose Status Informant  blood glucose meter kit and supplies KIT [id number] Yes Dispense based on patient and insurance preference. Use up to four times daily as directed. Gabriel Sprout, FNP Taking Active   carvedilol (COREG) 3.125 MG tablet [id number] Yes Take 1 tablet (3.125 mg total) by mouth 2 (two) times daily with a meal. Theora Gianotti, NP Taking Active   clobetasol (TEMOVATE) 0.05 % external solution [id number] Yes Apply 1 Application topically 2 (two) times daily. Gabriel Sprout, FNP Taking Active   Continuous Blood Gluc Sensor (FREESTYLE LIBRE 3 SENSOR) [place] [id number] Yes Place 1 sensor on the skin every 14 days. Use to check glucose continuously Brita Romp Dionne Bucy, MD Taking  Active   dapagliflozin propanediol (FARXIGA) 10 MG TABS tablet [id number] Yes Take 1 tablet (10 mg total) by mouth daily. Alisa Graff, FNP Taking Active   fenofibrate (TRICOR) 145 MG tablet [id number] Yes Take 1 tablet (145 mg total) by mouth daily. Alisa Graff, FNP Taking Active   FLUoxetine  (PROZAC) 20 MG capsule [id number] Yes Take 1 capsule (20 mg total) by mouth daily. Gabriel Sprout, FNP Taking Active   Furosemide (FUROSCIX) 80 MG/10ML CTKT [id number] Yes Inject 1 Dose into the skin as needed (edema). Bensimhon, Shaune Pascal, MD Taking Active   insulin aspart (NOVOLOG) 100 UNIT/ML FlexPen [id number] Yes Inject 6 units + correction before breakfast/lunch and 9 units + correction before supper (correction = 1 unit per 50 mg/dL above 150); max daily dose 36 units Tally Joe T, FNP Taking Active   Insulin Glargine (BASAGLAR KWIKPEN) 100 UNIT/ML [id number] Yes Inject 36 Units into the skin at bedtime. Swayze, Ava, DO Taking Active     Discontinued 01/21/22 1612 (Change in therapy)            Med Note Janan Ridge   Thu Feb 14, 2022  2:37 PM)    Insulin Pen Needle (PEN NEEDLES) 32G X 4 MM MISC [id number] Yes 200 each by Does not apply route at bedtime. Gabriel Sprout, FNP Taking Active   Ipratropium-Albuterol (COMBIVENT RESPIMAT) 20-100 MCG/ACT AERS respimat [id number] Yes Inhale 1 puff into the lungs every 6 (six) hours as needed for wheezing. [provider] Taking Active Self  meclizine (ANTIVERT) 25 MG tablet [id number] Yes Take 1 tablet (25 mg total) by mouth 3 (three) times daily as needed for dizziness. Patrecia Pour, MD Taking Active   nitroGLYCERIN (NITROSTAT) 0.4 MG SL tablet [id number] Yes Place 1 tablet (0.4 mg total) under the tongue every 5 (five) minutes x 3 doses as needed for chest pain. Lorella Nimrod, MD Taking Active Self  omeprazole (PRILOSEC) 40 MG capsule [id number] Yes Take 1 capsule (40 mg total) by mouth 2 (two) times daily before a meal for 30 days, THEN 1 capsule (40 mg total) daily before breakfast. Vanga, Tally Due, MD Taking Active   potassium chloride SA (KLOR-CON M) 20 MEQ tablet [id number] Yes Take 20 mEq by mouth every other day. [provider] Taking Active   rivaroxaban (XARELTO) 20 MG TABS tablet [id number] Yes Take 1 tablet (20 mg total)  by mouth daily with supper. Shirley Friar, PA-C Taking Active            Med Note Jodi Mourning, Tillie Fantasia May 30, 2022  1:36 PM)    rosuvastatin (CRESTOR) 40 MG tablet [id number] Yes Take 1 tablet (40 mg total) by mouth daily. Minna Merritts, MD Taking Active   sacubitril-valsartan [facility]) 49-51 MG [id number] Yes Take 1 tablet by mouth 2 (two) times daily. Bensimhon, Shaune Pascal, MD Taking Active   Semaglutide, 1 MG/DOSE, 4 MG/3ML SOPN [id number] Yes Inject 1 mg as directed once a week. Gabriel Sprout, FNP Taking Active   spironolactone (ALDACTONE) 25 MG tablet [id number] Yes Take 1 tablet (25 mg total) by mouth daily. Alisa Graff, FNP Taking Active   torsemide (DEMADEX) 20 MG tablet [id number] Yes Take 40 mg by mouth daily. Take an extra 20 mg tablet daily as needed for edema [provider] Taking Active             Patient Active Problem List   Diagnosis Date Noted   Dyspepsia  08/01/2022   Polyp of descending colon 08/01/2022   Screening for colon cancer 08/01/2022   Psoriasis 05/31/2022   ICD (implantable cardioverter-defibrillator) in place 05/31/2022   DOE (dyspnea on exertion) 89/38/1017   Chronic systolic heart failure ([place]) 05/20/2022   Syncope and collapse 05/14/2022   Chronic obstructive pulmonary disease (COPD) ([place]) 05/14/2022   Need for influenza vaccination 05/09/2022   Abdominal pain 03/26/2022   Patient cannot afford medications 03/26/2022   Proteinuria, unspecified 03/26/2022   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, with long-term current use of insulin ([place]) 02/08/2022   Depression, recurrent ([place]) 02/08/2022   Chronic tension-type headache, not intractable 02/08/2022   Acute kidney injury superimposed on chronic kidney disease ([place]) 01/18/2022   Dyslipidemia 01/18/2022   Demand ischemia    Gastroenteritis 11/25/2021   SIRS (systemic inflammatory response syndrome) ([place]) 11/25/2021   Abnormal  LFTs 11/24/2021   Pulmonary embolism ([place]) 11/24/2021   Acute on chronic systolic congestive heart failure ([place])    Annual physical exam 11/05/2021   Screening for malignant neoplasm of prostate 11/05/2021   Elevated serum creatinine 11/05/2021   Difficulty attaining erection 11/05/2021   Chronic HFrEF (heart failure with reduced ejection fraction) ([place]) 11/05/2021   Epistaxis 09/27/2021   CAD (coronary artery disease) 09/27/2021   Right foot pain 09/05/2021   Arch pain of right foot 09/05/2021   Elevated triglycerides with high cholesterol 09/05/2021   Memory loss or impairment 09/05/2021   Hyperlipidemia associated with type 2 diabetes mellitus ([place]) 09/05/2021   Hospital discharge follow-up 09/05/2021   Dyspnea 08/10/2021   Acute on chronic systolic (congestive) heart failure ([place]) 08/09/2021   Type II or unspecified type diabetes mellitus without mention of complication, not stated as uncontrolled 05/21/2021   Acute on chronic diastolic CHF (congestive heart failure) ([place]) 05/21/2021   Long term current use of anticoagulant 05/20/2021   Mitral regurgitation 05/20/2021   Mediastinal lymphadenopathy 05/20/2021   Atherosclerotic heart disease of native coronary artery with other forms of angina pectoris ([place])    Acalculous cholecystitis 03/29/2021   Right upper quadrant abdominal pain 03/28/2021   AKI (acute kidney injury) ([place]) 11/24/2020   Acute kidney injury ([place]) 11/23/2020   OSA on CPAP 11/23/2020   Chest pain 10/27/2020   Snoring 08/15/2020   Type II diabetes mellitus with renal manifestations ([place]) 06/11/2020   COPD (chronic obstructive pulmonary disease) ([place]) 06/11/2020   Leukocytosis 06/11/2020   Hyperglycemia    Elevated brain natriuretic peptide (BNP) level    Uncontrolled type 2 diabetes mellitus with hyperglycemia, with long-term current use of insulin ([place]) 05/13/2020   Acute on chronic HFrEF (heart failure with reduced ejection fraction) ([place]) 05/12/2020    Screening PSA (prostate specific antigen) 03/15/2020   Erectile dysfunction 03/02/2020   History of pulmonary embolism    Hemoptysis    Costochondritis    Acute on chronic combined systolic (congestive) and diastolic (congestive) heart failure ([place]) 12/20/2019   Acute on chronic heart failure ([place]) 11/22/2019   CKD (chronic kidney disease), stage IIIa 06/26/2019   Type 2 diabetes mellitus with hyperlipidemia ([place]) 06/26/2019   Acute on chronic combined systolic and diastolic CHF (congestive heart failure) ([place]) 09/02/2017   Other chest pain 08/24/2017   Dizziness    Noncompliance with medications 04/29/2017   Back pain 03/06/2017   Tobacco use 12/04/2016   Gallbladder sludge 12/04/2016   Acute on chronic systolic CHF (congestive heart failure) ([name]) 11/28/2016   Coronary artery disease, non-occlusive 03/15/2016  Atypical chest pain 02/16/2016   Hypomagnesemia 37/90/2409   Chronic systolic CHF (congestive heart failure) ([place]) 01/23/2016   NICM (nonischemic cardiomyopathy) ([place]) 01/17/2016   NSVT (nonsustained ventricular tachycardia) (HCC)    Troponin I above reference range    Mixed hyperlipidemia 05/17/2015   COPD exacerbation ([place]) 05/17/2015   Elevated troponin 03/20/2015    Conditions to be addressed/monitored per PCP order:   community resources  There are no care plans that you recently modified to display for this patient.     Plan: The  Patient has been provided with contact information for the Managed Medicaid care management team and has been advised to call with any health related questions or concerns.    Mickel Fuchs, BSW, [place] Managed Medicaid Team  [phone]

## 2022-08-20 ENCOUNTER — Ambulatory Visit (INDEPENDENT_AMBULATORY_CARE_PROVIDER_SITE_OTHER): Payer: Medicaid Other

## 2022-08-20 DIAGNOSIS — I428 Other cardiomyopathies: Secondary | ICD-10-CM | POA: Diagnosis not present

## 2022-08-20 LAB — CUP PACEART REMOTE DEVICE CHECK
Battery Remaining Percentage: 98 %
Date Time Interrogation Session: 20240102091000
Implantable Lead Connection Status: 753985
Implantable Lead Implant Date: 20231002
Implantable Lead Location: 753862
Implantable Lead Model: 3501
Implantable Lead Serial Number: 221766
Implantable Pulse Generator Implant Date: 20231002
Pulse Gen Serial Number: 189685

## 2022-08-23 IMAGING — CR DG CHEST 2V
2 series · 2 of 2 positions shown · non-contrast
Comparison: 02/19/2021

CLINICAL DATA: Shortness of breath

EXAM:
CHEST - 2 VIEW

[chest pa]
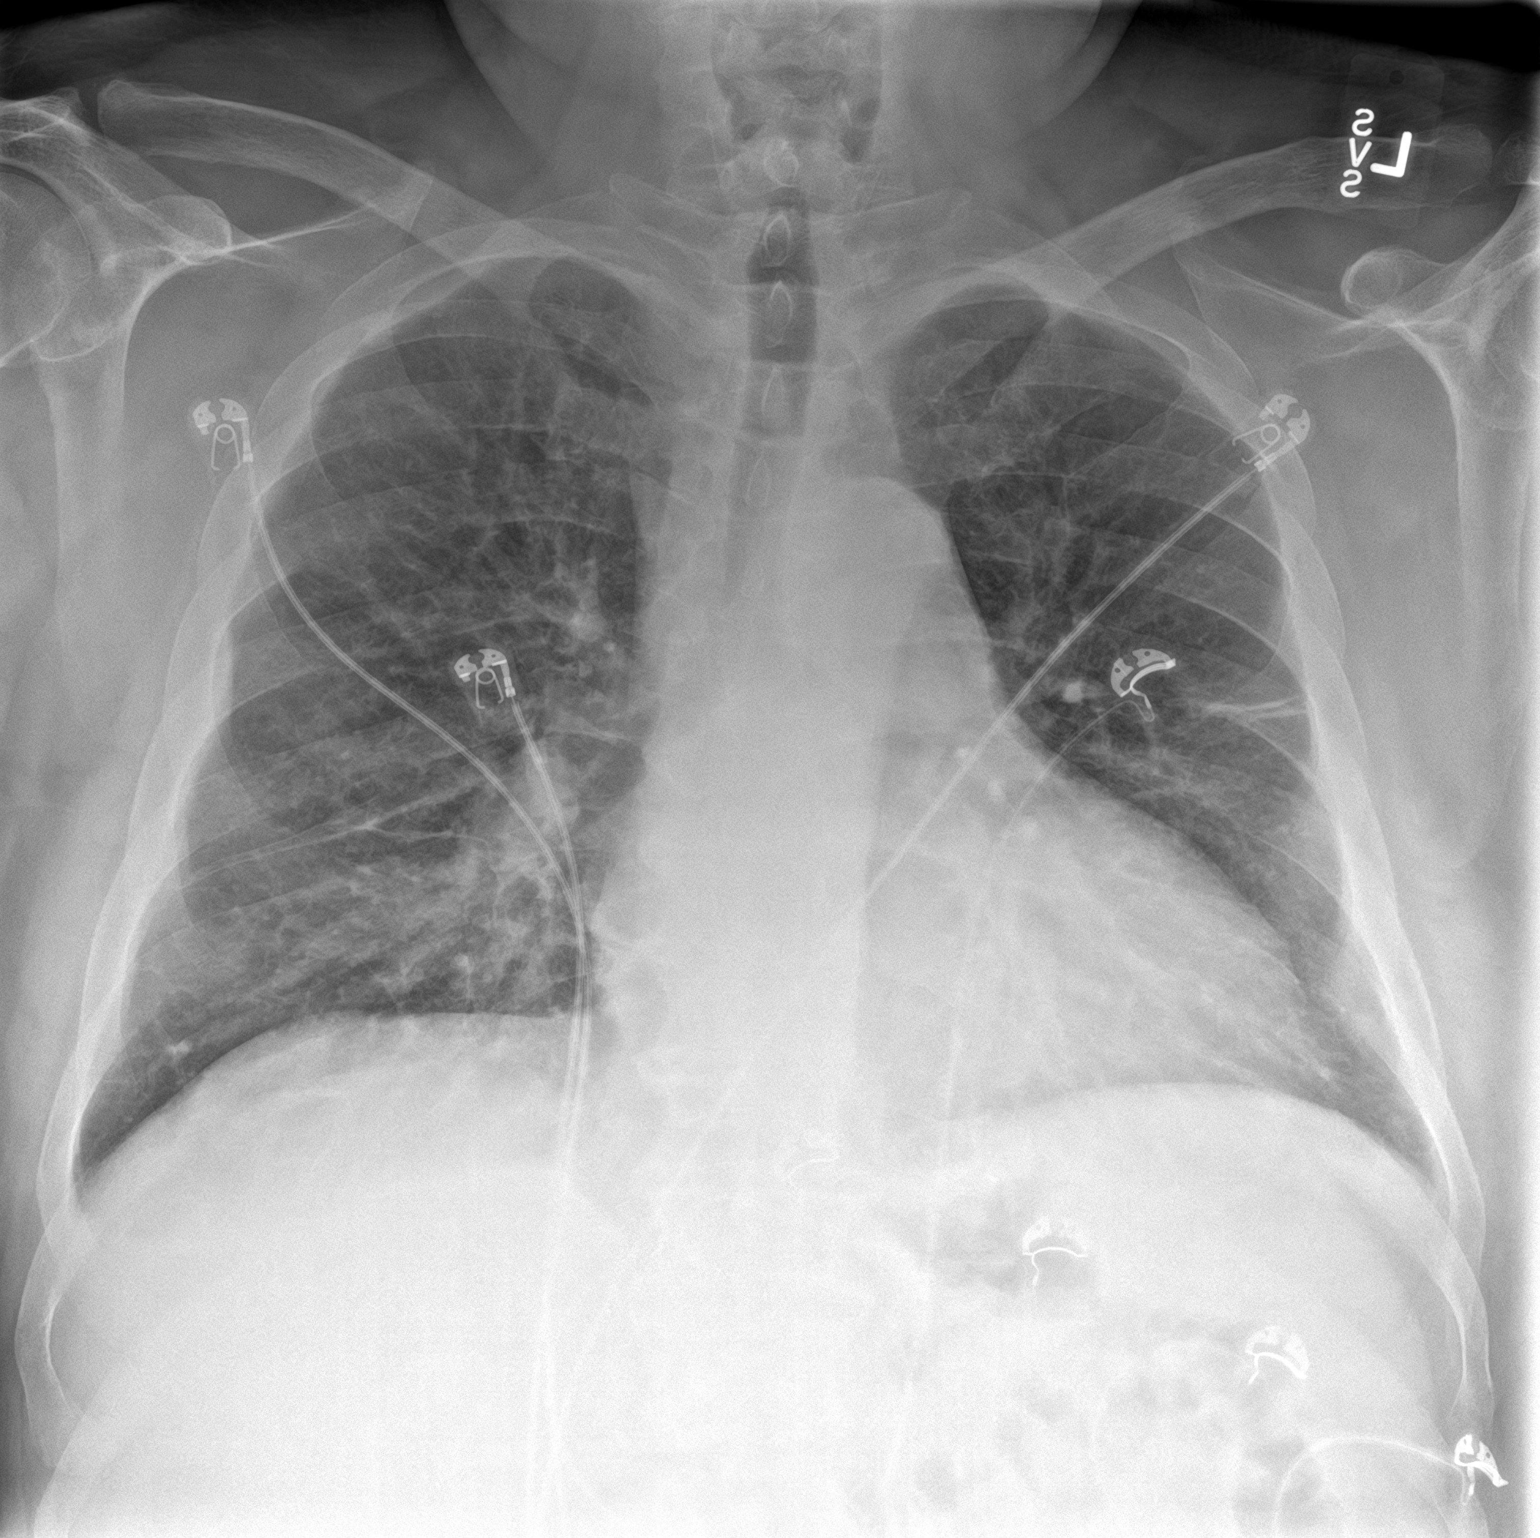

[chest lat]
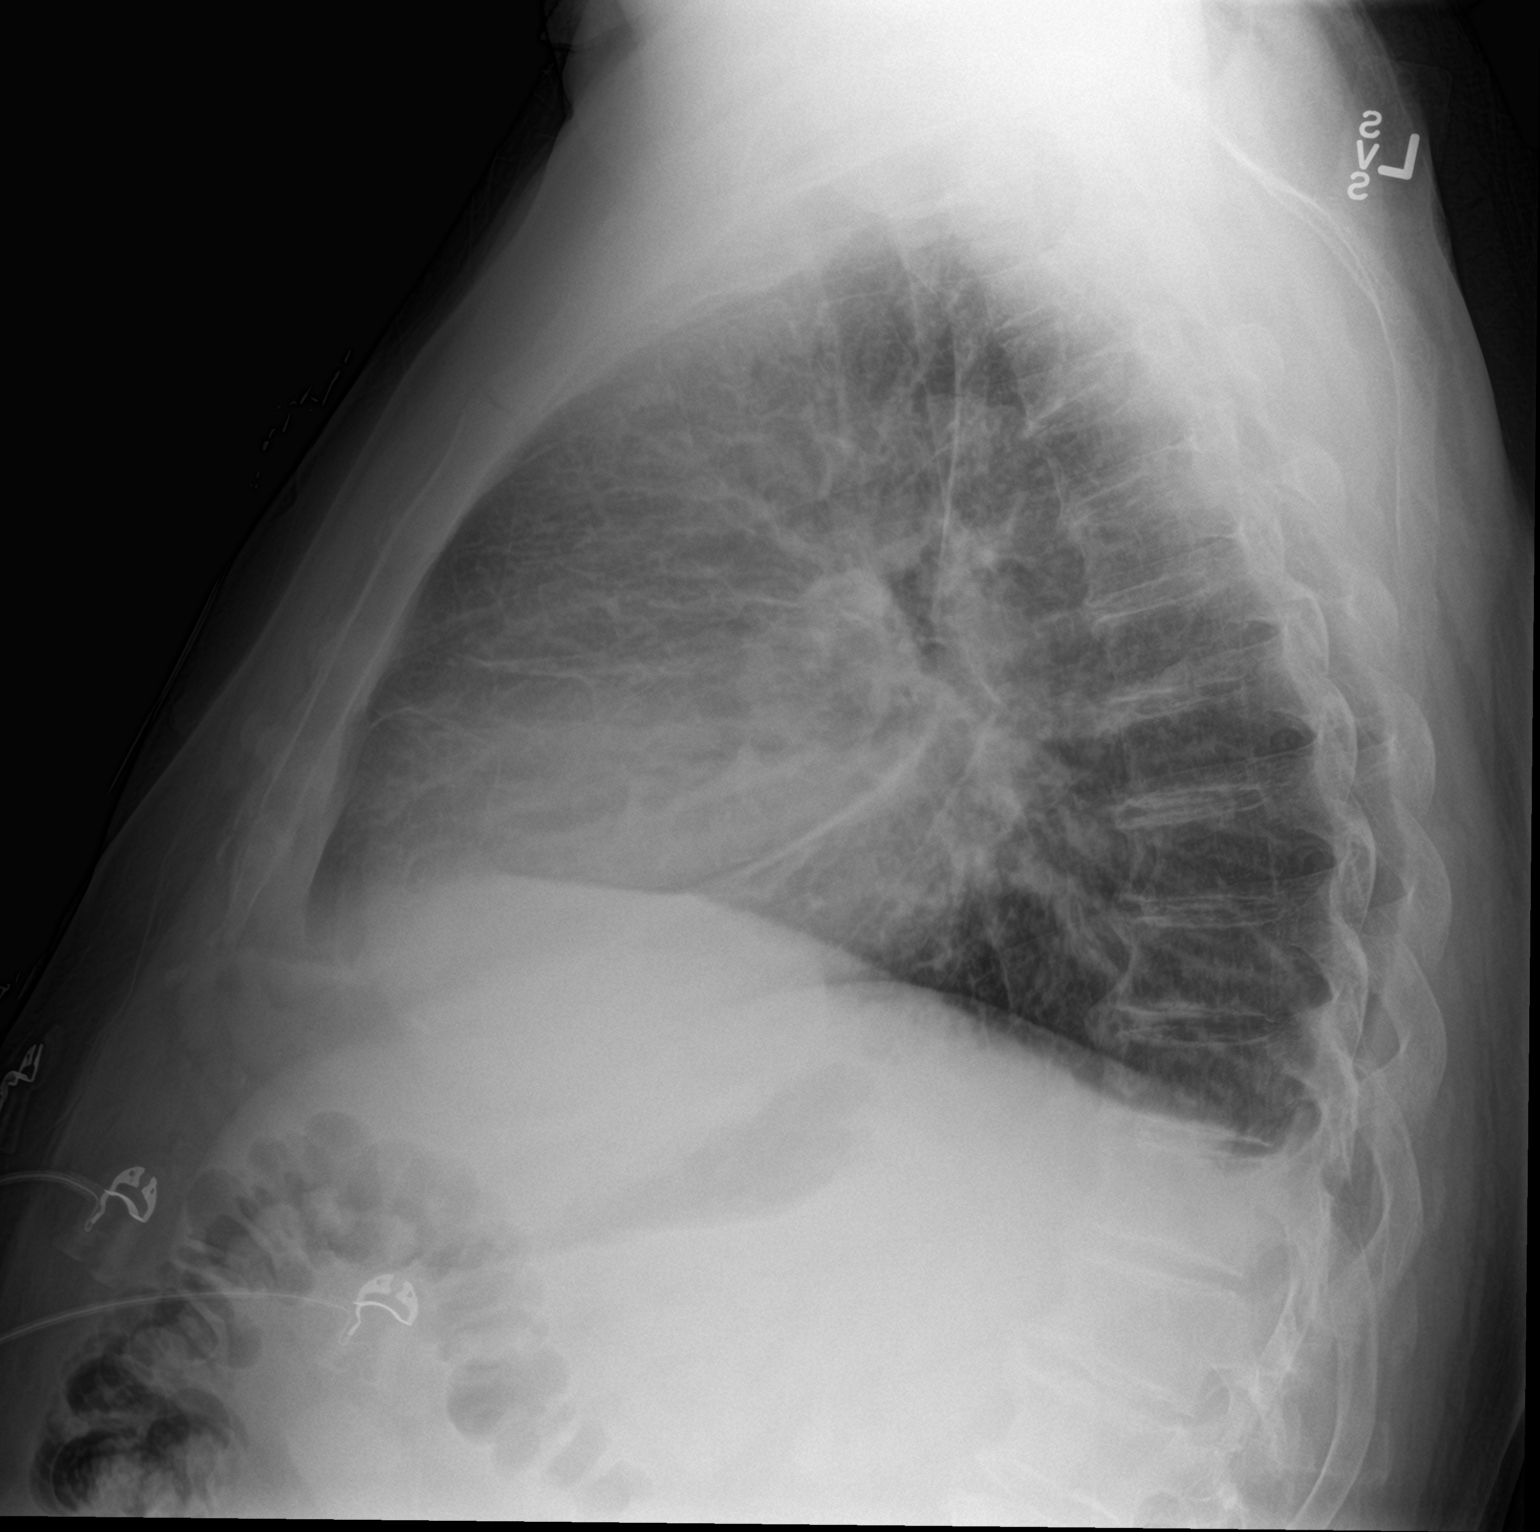

[2 of 2 positions shown; findings below may reference images not displayed]

FINDINGS: Heart is upper limits normal in size. Lingular scarring. No
confluent opacities otherwise. No effusions or acute bony
abnormality.
IMPRESSION: No active cardiopulmonary disease.

## 2022-08-27 ENCOUNTER — Ambulatory Visit: Payer: Medicaid Other | Attending: Internal Medicine | Admitting: Internal Medicine

## 2022-08-27 ENCOUNTER — Encounter: Payer: Self-pay | Admitting: Internal Medicine

## 2022-08-27 VITALS — BP 132/86 | HR 89 | Resp 20 | Wt 249.0 lb

## 2022-08-27 DIAGNOSIS — I10 Essential (primary) hypertension: Secondary | ICD-10-CM | POA: Diagnosis not present

## 2022-08-27 DIAGNOSIS — I2510 Atherosclerotic heart disease of native coronary artery without angina pectoris: Secondary | ICD-10-CM | POA: Diagnosis not present

## 2022-08-27 DIAGNOSIS — G4733 Obstructive sleep apnea (adult) (pediatric): Secondary | ICD-10-CM | POA: Diagnosis not present

## 2022-08-27 DIAGNOSIS — N1831 Chronic kidney disease, stage 3a: Secondary | ICD-10-CM

## 2022-08-27 DIAGNOSIS — I5022 Chronic systolic (congestive) heart failure: Secondary | ICD-10-CM | POA: Diagnosis not present

## 2022-08-27 DIAGNOSIS — M10072 Idiopathic gout, left ankle and foot: Secondary | ICD-10-CM | POA: Diagnosis not present

## 2022-08-27 MED ORDER — TORSEMIDE 20 MG PO TABS: 40.0000 mg | ORAL_TABLET | Freq: Every day | ORAL | 6 refills | Status: DC

## 2022-08-27 MED ORDER — POTASSIUM CHLORIDE CRYS ER 20 MEQ PO TBCR: 20.0000 meq | EXTENDED_RELEASE_TABLET | ORAL | 6 refills | Status: DC

## 2022-08-27 MED ORDER — PREDNISONE 20 MG PO TABS: 40.0000 mg | ORAL_TABLET | Freq: Every day | ORAL | 0 refills | Status: AC

## 2022-08-27 MED ORDER — SACUBITRIL-VALSARTAN 97-103 MG PO TABS: 1.0000 | ORAL_TABLET | Freq: Two times a day (BID) | ORAL | 6 refills | Status: DC

## 2022-08-27 NOTE — Progress Notes (Signed)
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  Referred by [name] for further management of his HF.  10/21 admitted with chest pain and elevated troponin. Cath with chronic total occlusion of the RCA with left-to-right collaterals.  He otherwise had moderate, nonobstructive disease including a 50% distal stenosis in the LAD, 30% proximal stenosis in the left circumflex, and a 40% OM1 lesion.  He was medically managed. Follow-up echo in October 2022, in the setting of recurrent hospitalization for HF, showed EF 40-45%.    Readmitted in 2/23 and 4/23 with HF.  He responded well to IV diuresis and at his request, he was discharged home on April 12 torsemide 40 mg twice daily (previous outpatient dose of 60 mg daily).   He presented to the ED 6/1 with blood glucose elevated @ 534.  Troponin stable at 50.  Other notable labs: K 2.7, Creat up from baseline @ 2.42.  He was treated w/ insulin, IVF, and KCl.    Last echo 02/06/22: EF 30-35%   Underwent SQ ICD placement 05/20/22  Has been following with PCP and A1c down to 7.5 in 9/23  Saw GI and planning EGD/colon. Has h/o cirrhosis from NASH  Echo 08/14/22 EF 40-45% RV ok   Here for routine f/u. Says he's doing pretty good. Has to take his time because gets fatigued and SOB. Occasional CP. Over last 2 days had progressive pain in left ankle. Uses Furoscix about once every 2-3 months for swelling. Feels it helps tremendously. Applying for disability      Past Medical History:  Diagnosis Date   CAD (coronary artery disease)    a. 04/2015  low risk MV;  b. 12/2016 Cath: minor irregs in LAD/Diag/LCX/OM, RCA 40p/m/d; c. 05/2020 Cath: LM nl, LAD 50d, LCX 30p, OM1 40, RCA 100p w/ L->R collats. CO/CI 3.1/1.3-->Med rx.   Chest wall pain, chronic    Chronic Troponin Elevation    CKD (chronic kidney disease), stage II-III    COPD (chronic obstructive pulmonary disease) (HCC)    Diabetes mellitus without complication ([place])    HFrEF (heart failure with reduced ejection fraction) ([place])    a. 03/2015 Echo: EF 45-50%; b. 12/2015 Echo: EF 20-25%; c. 02/2016 Echo: EF 30-35%; d. 11/2016 Echo: EF 40-45%; e. 06/2019 Echo: EF 30-35%; f. 11/2019 Echo: EF 25-30%; g. 05/2020 Echo: EF 35-40%; h. 05/2021 Echo: EF 40-45%; i. 11/2021 Echo: EF 20-25%, glob HK, mild LVH, GrIII DD, sev red RV fxn, mild MR.   Hypertension    Mitral regurgitation    Mild to moderate by October 2021 echocardiogram.   Mixed Ischemic & NICM (nonischemic cardiomyopathy) ([place])    a. 03/2015 Echo: EF 45-50%; b. 12/2015 Echo: EF 20-25%; c. 02/2016 Echo: EF 30-35%; d. 11/2016 Echo: EF 40-45%; e. 06/2019 Echo: EF 30-35%; f. 11/2019 Echo: EF 25-30%; g. 05/2020 Echo: EF 35-40%; h. 05/2021 Echo: EF 40-45%; i. 11/2021 Echo: EF 20-25%   Myocardial infarct (HCC)    NSVT (nonsustained ventricular tachycardia) ([place])    a. 12/2015 noted on tele-->amio;  b. 12/2015 Event monitor: no VT noted.   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  12/31/19: RUL and RLL PEs.   Syncope    a. 01/2016 - felt to be vasovagal.    Current Outpatient Medications  Medication Sig Dispense Refill   blood glucose meter kit and supplies KIT Dispense based on patient and insurance preference. Use up to four times daily as directed. 1 each 11   carvedilol (COREG) 3.125 MG tablet Take 1 tablet (3.125 mg total) by mouth 2 (two) times daily with a meal. 60 tablet 3   clobetasol (TEMOVATE) 0.05 % external solution Apply 1 Application topically 2 (two) times  daily. 50 mL 1   Continuous Blood Gluc Sensor (FREESTYLE LIBRE 3 SENSOR) MISC Place 1 sensor on the skin every 14 days. Use to check glucose continuously 6 each 3   dapagliflozin propanediol (FARXIGA) 10 MG TABS tablet Take 1 tablet (10 mg total) by mouth daily. 30 tablet 5   fenofibrate (TRICOR) 145 MG tablet Take 1 tablet (145 mg total) by mouth daily. 90 tablet 3   FLUoxetine (PROZAC) 20 MG capsule Take 1 capsule (20 mg total) by mouth daily. 90 capsule 3   Furosemide (FUROSCIX) 80 MG/10ML CTKT Inject 1 Dose into the skin as needed (edema). 6 each 0   insulin aspart (NOVOLOG) 100 UNIT/ML FlexPen Inject 6 units + correction before breakfast/lunch and 9 units + correction before supper (correction = 1 unit per 50 mg/dL above 150); max daily dose 36 units 15 mL 3   Insulin Glargine (BASAGLAR KWIKPEN) 100 UNIT/ML Inject 36 Units into the skin at bedtime. 30 mL 3   Insulin Pen Needle (PEN NEEDLES) 32G X 4 MM MISC 200 each by Does not apply route at bedtime. 200 each 3   Ipratropium-Albuterol (COMBIVENT RESPIMAT) 20-100 MCG/ACT AERS respimat Inhale 1 puff into the lungs every 6 (six) hours as needed for wheezing.     meclizine (ANTIVERT) 25 MG tablet Take 1 tablet (25 mg total) by mouth 3 (three) times daily as needed for dizziness. 15 tablet 0   nitroGLYCERIN (NITROSTAT) 0.4 MG SL tablet Place 1 tablet (0.4 mg total) under the tongue every 5 (five) minutes x 3 doses as needed for chest pain. 30 tablet 0   omeprazole (PRILOSEC) 40 MG capsule Take 1 capsule (40 mg total) by mouth 2 (two) times daily before a meal for 30 days, THEN 1 capsule (40 mg total) daily before breakfast. 150 capsule 0   potassium chloride SA (KLOR-CON M) 20 MEQ tablet Take 20 mEq by mouth every other day.     rivaroxaban (XARELTO) 20 MG TABS tablet Take 1 tablet (20 mg total) by mouth daily with supper. 90 tablet 3   rosuvastatin (CRESTOR) 40 MG tablet Take 1 tablet (40 mg total) by mouth daily. 90 tablet 3   sacubitril-valsartan  (ENTRESTO) 49-51 MG Take 1 tablet by mouth 2 (two) times daily. 60 tablet 3   Semaglutide, 1 MG/DOSE, 4 MG/3ML SOPN Inject 1 mg as directed once a week. 9 mL 0   spironolactone (ALDACTONE) 25 MG tablet Take 1 tablet (25 mg total) by mouth daily. 90 tablet 3   torsemide (DEMADEX) 20 MG tablet Take 40 mg by mouth daily. Take an extra 20 mg tablet daily as needed for edema     No current facility-administered medications for this visit.    Allergies  Allergen Reactions   Metformin And Related Nausea And Vomiting   Prednisone Other (See Comments)  Reaction: Hallucinations     Zantac [Ranitidine Hcl] Diarrhea and Nausea Only    Night sweats      Social History   Socioeconomic History   Marital status: Widowed    Spouse name: Not on file   Number of children: 1   Years of education: 56   Highest education level: 12th grade  Occupational History   Occupation: disabled  Tobacco Use   Smoking status: Former    Packs/day: 0.50    Years: 33.00    Total pack years: 16.50    Types: Cigarettes    Quit date: 05/08/2020    Years since quitting: 2.3   Smokeless tobacco: Former    Quit date: 05/08/2020   Tobacco comments:    Quit Sept 2021  Vaping Use   Vaping Use: Former  Substance and Sexual Activity   Alcohol use: Not Currently    Comment: occassionally   Drug use: No   Sexual activity: Not Currently  Other Topics Concern   Not on file  Social History Narrative   Lives at home with wife and his dad's wife.        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    Social Determinants of Health   Financial Resource Strain: Medium Risk (08/05/2022)   Overall Financial Resource Strain (CARDIA)    Difficulty of Paying Living Expenses: Somewhat hard  Food Insecurity: No Food Insecurity (08/15/2022)   Hunger Vital Sign    Worried About Running Out of Food in the Last Year: Never true    Ran Out of Food in the Last Year:  Never true  Transportation Needs: Unmet Transportation Needs (08/15/2022)   PRAPARE - Transportation    Lack of Transportation (Medical): Yes    Lack of Transportation (Non-Medical): Yes  Physical Activity: Insufficiently Active (01/20/2018)   Exercise Vital Sign    Days of Exercise per Week: 7 days    Minutes of Exercise per Session: 10 min  Stress: Stress Concern Present (09/02/2017)   [place]    Feeling of Stress : Very much  Social Connections: Moderately Isolated (09/02/2017)   Social Connection and Isolation Panel [NHANES]    Frequency of Communication with Friends and Family: Twice a week    Frequency of Social Gatherings with Friends and Family: Once a week    Attends Religious Services: Never    Marine scientist or Organizations: No    Attends Archivist Meetings: Never    Marital Status: Widowed  Intimate Partner Violence: Not At Risk (05/14/2022)   Humiliation, Afraid, Rape, and Kick questionnaire    Fear of Current or Ex-Partner: No    Emotionally Abused: No    Physically Abused: No    Sexually Abused: No      Family History  Problem Relation Age of Onset   Diabetes Mother    Diabetes Mellitus II Mother    Hypothyroidism Mother    Hypertension Mother    Kidney failure Mother        Dialysis   Heart attack Mother        77 yo approximately   Hypertension Father    Gout Father    Cancer Maternal Grandfather    Diabetes Maternal Grandfather    Cancer Paternal Aunt     Vitals:   08/27/22 1125  BP: 132/86  Pulse: 89  Resp: 20  SpO2: 98%  Weight: 249 lb (112.9 kg)   Wt Readings from Last 3 Encounters:  08/27/22 249 lb (112.9 kg)  08/09/22 242 lb 4.8 oz (109.9 kg)  08/01/22 240 lb (108.9 kg)     PHYSICAL EXAM: General:  Sitting chair. No resp difficulty HEENT: normal Neck: supple. no JVD. Carotids 2+ bilat; no bruits. No lymphadenopathy or thryomegaly appreciated. Cor:  PMI nondisplaced. Regular rate & rhythm. No rubs, gallops or murmurs. Lungs: clear Abdomen: soft, nontender, nondistended. No hepatosplenomegaly. No bruits or masses. Good bowel sounds. Extremities: no cyanosis, clubbing, rash, edema L ankle tender to touch along joint line. Limited ROM due to pain.  Neuro: alert & orientedx3, cranial nerves grossly intact. moves all 4 extremities w/o difficulty. Affect pleasant   ASSESSMENT & PLAN:   1.  Chronic HFrEF/Mixed Ischemic and nonischemic cardiomyopathy:   - Mixed ICM/NICM - 10/22 where EF was 40-45%.  - Echo 02/06/22: EF 30-35%  - Echo 08/14/22 EF 40-45% RV ok  - Stable NYHA III - s/p [place] Sci Sq-ICD  - Volume status looks good. ReDS (has been falsely elevated) - Continue torsemide 60 daily. Continue to use Furoscix if weight >= 250  - Increase Entresto to 97/103 bid - Continue Farxiga 10 - Continue spiro 25 daily - Continue carvedilol 3.125 bid - Consider Cardiomems to help volume management as needed -> his insurance won't cover - Quit CR due to fact he couldn't afford gas -> will contact SW for assistance (they were willing to get him transportation but he refused) - EF improved on recent echo. Symptoms stable.  - Recent labs ok  2.  CAD/Elevated Troponin and chronic CP:  - H/o CAD w/ last cath in 10/201 revealing an occluded RCA w/ otw nonobs dzs and L  R collats.  - Continue ASA/statin - Stable angina   3.  HTN:  - Blood pressure controlled.   4.  H/o PE/Chronic anticoagulation:  -On Xarelto.  - No recent bleeding   5.  CKD IIIa  - recent bloodwork reviewed and OK. SCr 1.54 and K 4.0  - continue SGLT2i/Entresto   6. DM2 -  per PCP - glucose control much improved.  7. OSA - not on CPAP due to lack of insurance. Now on Medicaid - Sleep study 3/22: AHI 28  - will repeat sleep study (pending)  8. Acute gout in left ankle  - will treat with prednisone '40mg'$  x 3 days - f/u with PCP to consider allopurinol suppressive  therapy  Glori Bickers, MD  11:42 AM

## 2022-08-27 NOTE — Patient Instructions (Signed)
Medication Changes:  Increase Entresto to 97/103 mg Twice daily   START Prednisone 40 mg (2 tabs) for 3 days  Lab Work:  none  Testing/Procedures:  none  Referrals:  none  Special Instructions // Education:  Do the following things EVERYDAY: Weigh yourself in the morning before breakfast. Write it down and keep it in a log. Take your medicines as prescribed Eat low salt foods--Limit salt (sodium) to 2000 mg per day.  Stay as active as you can everyday Limit all fluids for the day to less than 2 liters   Follow-Up in: 2 months with Gabriel Kluver, NP and 4 months with Gabriel [name]    If you have any questions or concerns before your next appointment please send [place] a message through [place] or call our office at [phone] Monday-Friday 8 am-5 pm.   If you have an urgent need after hours on the weekend please call your Primary Cardiologist or the [facility] in [place] at [phone].

## 2022-08-28 ENCOUNTER — Ambulatory Visit: Payer: Medicaid Other | Attending: Cardiology | Admitting: Cardiology

## 2022-08-28 ENCOUNTER — Encounter: Payer: Medicaid Other | Admitting: Cardiology

## 2022-08-28 VITALS — BP 132/74 | HR 96 | Ht 72.0 in | Wt 245.0 lb

## 2022-08-28 DIAGNOSIS — Z95810 Presence of automatic (implantable) cardiac defibrillator: Secondary | ICD-10-CM | POA: Diagnosis not present

## 2022-08-28 DIAGNOSIS — I5022 Chronic systolic (congestive) heart failure: Secondary | ICD-10-CM | POA: Diagnosis not present

## 2022-08-28 LAB — CUP PACEART INCLINIC DEVICE CHECK
Date Time Interrogation Session: 20240110142334
Implantable Lead Connection Status: 753985
Implantable Lead Implant Date: 20231002
Implantable Lead Location: 753862
Implantable Lead Model: 3501
Implantable Lead Serial Number: 221766
Implantable Pulse Generator Implant Date: 20231002
Pulse Gen Serial Number: 189685

## 2022-08-28 NOTE — Progress Notes (Signed)
Electrophysiology Office Follow up Visit Note:    Date:  08/28/2022   ID:  Gabriel Righter., DOB 1971-03-11, MRN [id number]  PCP:  [name], FNP  [facility] Cardiologist:  [name], MD  [facility] Electrophysiologist:  Vickie Epley, MD    Interval History:    Gabriel Yonke. is a 52 y.o. male who presents for a follow up visit.  He had a subcutaneous ICD implanted May 20, 2022.  The device has been well-functioning.  He saw Dr. Haroldine Laws August 27, 2022 in follow-up. He is doing well today.  No problems with his defibrillator incisions.  No high-voltage therapies have been delivered.      Past Medical History:  Diagnosis Date   CAD (coronary artery disease)    a. 04/2015 low risk MV;  b. 12/2016 Cath: minor irregs in LAD/Diag/LCX/OM, RCA 40p/m/d; c. 05/2020 Cath: LM nl, LAD 50d, LCX 30p, OM1 40, RCA 100p w/ L->R collats. CO/CI 3.1/1.3-->Med rx.   Chest wall pain, chronic    Chronic Troponin Elevation    CKD (chronic kidney disease), stage II-III    COPD (chronic obstructive pulmonary disease) (HCC)    Diabetes mellitus without complication ([place])    HFrEF (heart failure with reduced ejection fraction) ([place])    a. 03/2015 Echo: EF 45-50%; b. 12/2015 Echo: EF 20-25%; c. 02/2016 Echo: EF 30-35%; d. 11/2016 Echo: EF 40-45%; e. 06/2019 Echo: EF 30-35%; f. 11/2019 Echo: EF 25-30%; g. 05/2020 Echo: EF 35-40%; h. 05/2021 Echo: EF 40-45%; i. 11/2021 Echo: EF 20-25%, glob HK, mild LVH, GrIII DD, sev red RV fxn, mild MR.   Hypertension    Mitral regurgitation    Mild to moderate by October 2021 echocardiogram.   Mixed Ischemic & NICM (nonischemic cardiomyopathy) ([place])    a. 03/2015 Echo: EF 45-50%; b. 12/2015 Echo: EF 20-25%; c. 02/2016 Echo: EF 30-35%; d. 11/2016 Echo: EF 40-45%; e. 06/2019 Echo: EF 30-35%; f. 11/2019 Echo: EF 25-30%; g. 05/2020 Echo: EF 35-40%; h. 05/2021 Echo: EF 40-45%; i. 11/2021 Echo: EF 20-25%   Myocardial infarct (HCC)    NSVT (nonsustained  ventricular tachycardia) ([place])    a. 12/2015 noted on tele-->amio;  b. 12/2015 Event monitor: no VT noted.   Obesity (BMI 30.0-34.9)    Psoriasis    Recurrent pulmonary emboli ([place]) 06/07/2020   06/07/20: small bilateral PEs.  12/31/19: RUL and RLL PEs.   Syncope    a. 01/2016 - felt to be vasovagal.    Past Surgical History:  Procedure Laterality Date   AMPUTATION     CARDIAC CATHETERIZATION     COLONOSCOPY WITH PROPOFOL N/A 08/01/2022   Procedure: COLONOSCOPY WITH PROPOFOL;  Surgeon: Lin Landsman, MD;  Location[facility]COPY;  Service: Gastroenterology;  Laterality: N/A;   ESOPHAGOGASTRODUODENOSCOPY (EGD) WITH PROPOFOL N/A 08/01/2022   Procedure: ESOPHAGOGASTRODUODENOSCOPY (EGD) WITH PROPOFOL;  Surgeon: Lin Landsman, MD;  Location: [facility] ENDOSCOPY;  Service: Gastroenterology;  Laterality: N/A;   FINGER AMPUTATION     Traumatic   FINGER FRACTURE SURGERY Left    FRACTURE SURGERY     LEFT HEART CATH AND CORONARY ANGIOGRAPHY N/A 01/06/2017   Procedure: Left Heart Cath and Coronary Angiography;  Surgeon: Wellington Hampshire, MD;  Location: [place] CV LAB;  Service: Cardiovascular;  Laterality: N/A;   RIGHT/LEFT HEART CATH AND CORONARY ANGIOGRAPHY N/A 06/13/2020   Procedure: RIGHT/LEFT HEART CATH AND CORONARY ANGIOGRAPHY;  Surgeon: Nelva Bush, MD;  Location: [facility] CV LAB;  Service: Cardiovascular;  Laterality: N/A;   SUBQ ICD IMPLANT N/A 05/20/2022   Procedure: SUBQ ICD IMPLANT;  Surgeon: Vickie Epley, MD;  Location: [place] [facility];  Service: Cardiovascular;  Laterality: N/A;    Current Medications: No outpatient medications have been marked as taking for the 08/28/22 encounter (Office Visit) with Vickie Epley, MD.     Allergies:   Metformin and related and Zantac [ranitidine hcl]   Social History   Socioeconomic History   Marital status: Widowed    Spouse name: Not on file   Number of children: 1   Years of education: 2   Highest  education level: 12th grade  Occupational History   Occupation: disabled  Tobacco Use   Smoking status: Former    Packs/day: 0.50    Years: 33.00    Total pack years: 16.50    Types: Cigarettes    Quit date: 05/08/2020    Years since quitting: 2.3   Smokeless tobacco: Former    Quit date: 05/08/2020   Tobacco comments:    Quit Sept 2021  Vaping Use   Vaping Use: Former  Substance and Sexual Activity   Alcohol use: Not Currently    Comment: occassionally   Drug use: No   Sexual activity: Not Currently  Other Topics Concern   Not on file  Social History Narrative   Lives at home with wife and his dad's wife.        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    Social Determinants of Health   Financial Resource Strain: Medium Risk (08/05/2022)   Overall Financial Resource Strain (CARDIA)    Difficulty of Paying Living Expenses: Somewhat hard  Food Insecurity: No Food Insecurity (08/15/2022)   Hunger Vital Sign    Worried About Running Out of Food in the Last Year: Never true    Ran Out of Food in the Last Year: Never true  Transportation Needs: Unmet Transportation Needs (08/15/2022)   PRAPARE - Transportation    Lack of Transportation (Medical): Yes    Lack of Transportation (Non-Medical): Yes  Physical Activity: Insufficiently Active (01/20/2018)   Exercise Vital Sign    Days of Exercise per Week: 7 days    Minutes of Exercise per Session: 10 min  Stress: Stress Concern Present (09/02/2017)   [place]    Feeling of Stress : Very much  Social Connections: Moderately Isolated (09/02/2017)   Social Connection and Isolation Panel [NHANES]    Frequency of Communication with Friends and Family: Twice a week    Frequency of Social Gatherings with Friends and Family: Once a week    Attends Religious Services: Never    Marine scientist or  Organizations: No    Attends Archivist Meetings: Never    Marital Status: Widowed     Family History: The patient's family history includes Cancer in his maternal grandfather and paternal aunt; Diabetes in his maternal grandfather and mother; Diabetes Mellitus II in his mother; Gout in his father; Heart attack in his mother; Hypertension in his father and mother; Hypothyroidism in his mother; Kidney failure in his mother.  ROS:   Please see the history of present illness.    All other systems reviewed and are negative.  EKGs/Labs/Other Studies Reviewed:    The following studies were reviewed today:  August 28, 2022 in clinic device interrogation personally reviewed Battery and lead  parameter stable.  No high-voltage therapies have been delivered.    Recent Labs: 11/05/2021: TSH 1.270 01/17/2022: Magnesium 2.4 07/22/2022: B Natriuretic Peptide 114.8 07/25/2022: Hemoglobin 17.2; Platelets 209 08/09/2022: ALT 21; BUN 27; Creatinine, Ser 1.54; Potassium 4.0; Sodium 143  Recent Lipid Panel    Component Value Date/Time   CHOL 143 08/09/2022 0922   TRIG 367 (H) 08/09/2022 0922   HDL 24 (L) 08/09/2022 0922   CHOLHDL 6.0 (H) 08/09/2022 0922   CHOLHDL 6.5 11/25/2021 0620   VLDL 31 11/25/2021 0620   LDLCALC 62 08/09/2022 0922   LDLDIRECT 60.6 11/23/2020 2101    Physical Exam:    VS:  BP 132/74   Pulse 96   Ht 6' (1.829 m)   Wt 245 lb (111.1 kg)   SpO2 92%   BMI 33.23 kg/m     Wt Readings from Last 3 Encounters:  08/28/22 245 lb (111.1 kg)  08/27/22 249 lb (112.9 kg)  08/09/22 242 lb 4.8 oz (109.9 kg)     GEN:  Well nourished, well developed in no acute distress HEENT: Normal NECK: No JVD LYMPHATICS: No lymphadenopathy CARDIAC: RRR, no murmurs, rubs, gallops.  Subcutaneous ICD incision sites well-healed RESPIRATORY:  Clear to auscultation without rales, wheezing or rhonchi  ABDOMEN: Soft, non-tender, non-distended MUSCULOSKELETAL:  No edema; No deformity   SKIN: Warm and dry NEUROLOGIC:  Alert and oriented x 3 PSYCHIATRIC:  Normal affect        ASSESSMENT:    1. Chronic systolic heart failure ([place]riel [name])   2. Implantable cardioverter-defibrillator (ICD) in situ    PLAN:    In order of problems listed above:   #Chronic systolic heart failure Follows with Dr. Haroldine Laws NYHA class II today.  Continue home meds  #ICD in situ Device functioning appropriately.  Continue remote monitoring.  Follow-up with APP in 1 year or sooner as needed.   Medication Adjustments/Labs and Tests Ordered: Current medicines are reviewed at length with the patient today.  Concerns regarding medicines are outlined above.  No orders of the defined types were placed in this encounter.  No orders of the defined types were placed in this encounter.    Signed, Lars Mage, MD, [facility], [facility] 08/28/2022 11:12 AM    Electrophysiology [facility]

## 2022-08-28 NOTE — Patient Instructions (Signed)
Medication Instructions:  Your physician recommends that you continue on your current medications as directed. Please refer to the Current Medication list given to you today.  *If you need a refill on your cardiac medications before your next appointment, please call your pharmacy*   Lab Work: None ordered.  If you have labs (blood work) drawn today and your tests are completely normal, you will receive your results only by: [place] (if you have MyChart) OR A paper copy in the mail If you have any lab test that is abnormal or we need to change your treatment, we will call you to review the results.   Testing/Procedure None ordered.    Follow-Up: At [facility], you and your health needs are our priority.  As part of our continuing mission to provide you with exceptional heart care, we have created designated Provider Care Teams.  These Care Teams include your primary Cardiologist (physician) and Advanced Practice Providers (APPs -  Physician Assistants and Nurse Practitioners) who all work together to provide you with the care you need, when you need it.  We recommend signing up for the patient portal called "MyChart".  Sign up information is provided on this After Visit Summary.  MyChart is used to connect with patients for Virtual Visits (Telemedicine).  Patients are able to view lab/test results, encounter notes, upcoming appointments, etc.  Non-urgent messages can be sent to your provider as well.   To learn more about what you can do with MyChart, go to [email or web address].    Your next appointment:   12 months with Dr Mardene Speak APP  Important Information About Sugar

## 2022-09-16 ENCOUNTER — Other Ambulatory Visit: Payer: Medicaid Other | Admitting: Pharmacist

## 2022-09-16 MED ORDER — SEMAGLUTIDE (2 MG/DOSE) 8 MG/3ML ~~LOC~~ SOPN: 2.0000 mg | PEN_INJECTOR | SUBCUTANEOUS | 1 refills | Status: DC

## 2022-09-16 MED ORDER — ICOSAPENT ETHYL 1 G PO CAPS: 2.0000 g | ORAL_CAPSULE | Freq: Two times a day (BID) | ORAL | 1 refills | Status: DC

## 2022-09-16 NOTE — Progress Notes (Unsigned)
I,Ursala Cressy R Baudelia Schroepfer,acting as a Education administrator for [name], FNP.,have documented all relevant documentation on the behalf of [name], FNP,as directed by  [name], FNP while in the presence of [name], FNP.   Established patient visit   Patient: Gabriel [name].   DOB: 13-Aug-1971   52 y.o. Male  MRN: [id number] Visit Date: 09/17/2022  Today's healthcare provider: [name], FNP  Introduced to nurse practitioner role and practice setting.  All questions answered.  Discussed provider/patient relationship and expectations.   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 The patient reports onset of an acute gout attack involving the left foot beginning 3 days, and being treated with prednisone . Patient reports his chronic pain is ongoing, his joint stiffness is ongoing and his joint swelling is ongoing. Limitation on activities include difficulty with walking. The patient is not avoiding high purine foods and reports consuming 0 alcoholic drinks per day.  Patient here for evaluations of pain in in his left foot, knees and back. Pt states that he would like to make sure that the pain is coming from the gout, and not an under lying issue.   This episode of pain began 3 days ago with no improvement.    Medications: Outpatient Medications Prior to Visit  Medication Sig   blood glucose meter kit and supplies KIT Dispense based on patient and insurance preference. Use up to four times daily as directed.   carvedilol (COREG) 3.125 MG tablet Take 1 tablet (3.125 mg total) by mouth 2 (two) times daily with a meal.   clobetasol (TEMOVATE) 0.05 % external solution Apply 1 Application topically 2 (two) times daily.   Continuous Blood Gluc Sensor (FREESTYLE LIBRE 3 SENSOR) MISC Place 1 sensor on the skin every 14 days. Use to check glucose continuously   dapagliflozin propanediol (FARXIGA)  10 MG TABS tablet Take 1 tablet (10 mg total) by mouth daily.   fenofibrate (TRICOR) 145 MG tablet Take 1 tablet (145 mg total) by mouth daily.   FLUoxetine (PROZAC) 20 MG capsule Take 1 capsule (20 mg total) by mouth daily.   Furosemide (FUROSCIX) 80 MG/10ML CTKT Inject 1 Dose into the skin as needed (edema).   icosapent Ethyl (VASCEPA) 1 g capsule Take 2 capsules (2 g total) by mouth 2 (two) times daily.   insulin aspart (NOVOLOG) 100 UNIT/ML FlexPen Inject 6 units + correction before breakfast/lunch and 9 units + correction before supper (correction = 1 unit per 50 mg/dL above 150); max daily dose 36 units   Insulin Glargine (BASAGLAR KWIKPEN) 100 UNIT/ML Inject 36 Units into the skin at bedtime.   Insulin Pen Needle (PEN NEEDLES) 32G X 4 MM MISC 200 each by Does not apply route at bedtime.   Ipratropium-Albuterol (COMBIVENT RESPIMAT) 20-100 MCG/ACT AERS respimat Inhale 1 puff into the lungs every 6 (six) hours as needed for wheezing.   meclizine (ANTIVERT) 25 MG tablet Take 1 tablet (25 mg total) by mouth 3 (three) times daily as needed for dizziness.   omeprazole (PRILOSEC) 40 MG capsule Take 1 capsule (40 mg total) by mouth 2 (two) times daily before a meal for 30 days, THEN 1 capsule (40 mg total) daily before breakfast.   potassium chloride SA (KLOR-CON M) 20 MEQ tablet Take 1 tablet (20 mEq total) by mouth every other day.  rivaroxaban (XARELTO) 20 MG TABS tablet Take 1 tablet (20 mg total) by mouth daily with supper.   rosuvastatin (CRESTOR) 40 MG tablet Take 1 tablet (40 mg total) by mouth daily.   sacubitril-valsartan (ENTRESTO) 97-103 MG Take 1 tablet by mouth 2 (two) times daily.   Semaglutide, 2 MG/DOSE, 8 MG/3ML SOPN Inject 2 mg as directed once a week.   spironolactone (ALDACTONE) 25 MG tablet Take 1 tablet (25 mg total) by mouth daily.   torsemide (DEMADEX) 20 MG tablet Take 2 tablets (40 mg total) by mouth daily. Take an extra 20 mg tablet daily as needed for edema    [DISCONTINUED] nitroGLYCERIN (NITROSTAT) 0.4 MG SL tablet Place 1 tablet (0.4 mg total) under the tongue every 5 (five) minutes x 3 doses as needed for chest pain.   No facility-administered medications prior to visit.    Review of Systems     Objective    BP 125/77 (BP Location: Right Arm, Patient Position: Sitting, Cuff Size: Normal)   Pulse 87   Temp 97.7 F (36.5 C) (Oral)   Wt 246 lb 3.2 oz (111.7 kg)   SpO2 99%   BMI 33.39 kg/m    Physical Exam Vitals and nursing note reviewed.  Constitutional:      Appearance: Normal appearance. He is obese.  HENT:     Head: Normocephalic and atraumatic.  Eyes:     Pupils: Pupils are equal, round, and reactive to light.  Cardiovascular:     Rate and Rhythm: Normal rate and regular rhythm.     Pulses: Normal pulses.     Heart sounds: Normal heart sounds.  Pulmonary:     Effort: Pulmonary effort is normal.     Breath sounds: Normal breath sounds.  Musculoskeletal:        General: Tenderness present. No swelling or signs of injury. Normal range of motion.     Cervical back: Normal range of motion.     Comments: Tenderness from hips/lower back down to toes; occasionally feels it is coming top down, and other times bottom up. "Some days I can't walk"  Skin:    General: Skin is warm and dry.     Capillary Refill: Capillary refill takes less than 2 seconds.  Neurological:     General: No focal deficit present.     Mental Status: He is alert and oriented to person, place, and time. Mental status is at baseline.      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, with long-term current use of insulin ([place])    Chronic, previously stable Working with Jodi Mourning PharmD with Ozempic  dosing Patient has been working on DM diet Continues to be limited in functional capacity for purposeful exercise Unable to void for repeat urine micro albumin        Musculoskeletal and Integument   Psoriatic arthritis of multiple joints ([place])    Hx of gout; worse in ~1 year since being compliant with HF treatment Unable to be seen by derm until 6/24 Unable to tolerate oral prednisone d/t side effects including hyperglycemia  Referral to rheum for assistance       Relevant Medications  allopurinol (ZYLOPRIM) 100 MG tablet   colchicine 0.6 MG tablet   Other Relevant Orders   Ambulatory referral to Rheumatology     Other   Atypical chest pain - Primary    Secure chat to Mallie Snooks, LAMB[place] Refill of Nitro SL tabs Encouraged to seek emergent care if not resolved following use of 3 nitro Reports use of 1-2 nitro to assist with current symptoms; symptoms wake him 2-3 times a week at night      Chronic gout due to renal impairment of multiple sites without tophus    Uric acid elevated previously; repeat labs given current complaints of foot and leg pain Trial of allopurinol daily with PRN colchicine       Relevant Medications   allopurinol (ZYLOPRIM) 100 MG tablet   colchicine 0.6 MG tablet   Other Relevant Orders   Uric acid (Completed)   Chronic pain of both lower extremities    Chronic, worsening CHF team concerned for gout vs psoriatic arthritis  Will repeat uric acid level given known CKD variation as well as diuretic use +uric acid; continue with plan as discussed. OK to continue with rheum referral for arthritis r/o      Return in about 4 weeks (around 10/15/2022) for labs- uric acid.     Gabriel Kotyk, FNP, have reviewed all documentation for this visit. The documentation on 09/18/22 for the exam, diagnosis, procedures, and orders are all accurate and complete.  [name], [place] [phone] (phone) [phone]  (fax)  [place]

## 2022-09-16 NOTE — Progress Notes (Signed)
09/16/2022 Name: Gabriel [name]. MRN: [id number] DOB: 05-06-1971  Chief Complaint  Patient presents with   Medication Management   Diabetes   Hyperlipidemia   Hypertension    Gabriel [name]. is a 52 y.o. year old male who presented for a telephone visit.   They were referred to the pharmacist by their PCP for assistance in managing diabetes and complex medication management.   Patient is participating in a Managed Medicaid Plan:  Yes  Subjective:  Care Team: Primary Care Provider: [name], FNP ; Next Scheduled Visit: tomorrow  Medication Access/Adherence  Current Pharmacy:  [facility] [address]), [place] [address] [place] ([place]) [place] [address] Phone: [phone] Fax: [place], [place] [address] [place] [address] Phone: [phone] Fax: [phone]   Patient reports affordability concerns with their medications: No  Patient reports access/transportation concerns to their pharmacy: No  Patient reports adherence concerns with their medications:  No    S/p gout flare; treated with prednisone by Dr. Haroldine Laws. Patient reports he did not complete the dose because he felt so bad with hyperglycemia.  Also notes that he spoke to the disability provider about his body rash and joint aches.    Diabetes:  Current medications: Ozempic 1 mg weekly, Basaglar 36 units daily, Novolog - 9 units base + correction; Farxiga 10 mg daily Medications tried in the past:   Date of Download: 09/12/21-09/15/22 % Time CGM is active: 96% Average Glucose: 157 mg/dL Glucose Management Indicator: 7.1  Glucose Variability: 19.1 (goal <36%) Time in Goal:  - Time in range 70-180: 81% - Time above range: 19% - Time below range: 0% Observed patterns: post prandial elevations   Patient denies hypoglycemic s/sx including  dizziness, shakiness, sweating. Patient denies hyperglycemic symptoms including polyuria, polydipsia, polyphagia, nocturia, neuropathy, blurred vision.  Notes he is learning what types of foods cause elevations.   Hyperlipidemia/ASCVD Risk Reduction  Current lipid lowering medications: rosuvastatin 40 mg daily, fenofibrate 145 mg daily,  Antiplatelet regimen: none due to DOAC  Patient endorses more frequent need for nitroglycerin lately.   Heart Failure:  Current medications:  ACEi/ARB/ARNI: Entresto 97/103 mg twice daily SGLT2i: Farxiga 10 mg daily Beta blocker: carvedilol 3.125 mg twice daily Mineralocorticoid Receptor Antagonist: spironolactone 25 mg daily Diuretic regimen: torsemide 40 mg daily, + 20 mg extra as needed; potassium 20 mE  Current home blood pressure readings: home readings ~120s/70s Current home weights: ~240s; took a dose of   Patient denies volume overload signs or symptoms including shortness of breath, lower extremity edema, increased use of pillows at night. Does note a dose of Furoscix a few weeks ago  Atrial Fibrillation:  Current medications: Rate Control: carvedilol 3.125 mg twice daily Rhythm Control:  Anticoagulation Regimen: Xarelto 20 mg daily    Objective:  Lab Results  Component Value Date   HGBA1C 7.1 (H) 07/22/2022    Lab Results  Component Value Date   CREATININE 1.54 (H) 08/09/2022   BUN 27 (H) 08/09/2022   NA 143 08/09/2022   K 4.0 08/09/2022   CL 98 08/09/2022   CO2 25 08/09/2022    Lab Results  Component Value Date   CHOL 143 08/09/2022   HDL 24 (L) 08/09/2022   LDLCALC 62 08/09/2022   LDLDIRECT 60.6 11/23/2020   TRIG 367 (H) 08/09/2022   CHOLHDL 6.0 (H)  08/09/2022    Medications Reviewed Today     Reviewed by Osker Mason, RPH-CPP (Pharmacist) on 09/16/22 at [place] List Status: <None>   Medication Order Taking? Sig Documenting Provider Last Dose Status Informant  blood glucose meter kit and supplies  KIT [id number]  Dispense based on patient and insurance preference. Use up to four times daily as directed. [name], FNP  Active   carvedilol (COREG) 3.125 MG tablet [id number] Yes Take 1 tablet (3.125 mg total) by mouth 2 (two) times daily with a meal. Theora Gianotti, NP Taking Active   clobetasol (TEMOVATE) 0.05 % external solution [id number]  Apply 1 Application topically 2 (two) times daily. [name], FNP  Active   Continuous Blood Gluc Sensor (FREESTYLE LIBRE 3 SENSOR) [place] [id number] Yes Place 1 sensor on the skin every 14 days. Use to check glucose continuously Virginia Crews, MD Taking Active   dapagliflozin propanediol (FARXIGA) 10 MG TABS tablet [id number] Yes Take 1 tablet (10 mg total) by mouth daily. Alisa Graff, FNP Taking Active   fenofibrate (TRICOR) 145 MG tablet [id number] Yes Take 1 tablet (145 mg total) by mouth daily. Alisa Graff, FNP Taking Active   FLUoxetine (PROZAC) 20 MG capsule [id number] Yes Take 1 capsule (20 mg total) by mouth daily. [name], FNP Taking Active   Furosemide (FUROSCIX) 80 MG/10ML CTKT [id number] Yes Inject 1 Dose into the skin as needed (edema). Bensimhon, Shaune Pascal, MD Taking Active   insulin aspart (NOVOLOG) 100 UNIT/ML FlexPen [id number] Yes Inject 6 units + correction before breakfast/lunch and 9 units + correction before supper (correction = 1 unit per 50 mg/dL above 150); max daily dose 36 units Tally Joe T, FNP Taking Active   Insulin Glargine (BASAGLAR KWIKPEN) 100 UNIT/ML [id number] Yes Inject 36 Units into the skin at bedtime. Swayze, Ava, DO Taking Active     Discontinued 01/21/22 1612 (Change in therapy)            Med Note Janan Ridge   Thu Feb 14, 2022  2:37 PM)    Insulin Pen Needle (PEN NEEDLES) 32G X 4 MM MISC [id number] Yes 200 each by Does not apply route at bedtime. [name], FNP Taking Active   Ipratropium-Albuterol (COMBIVENT RESPIMAT) 20-100 MCG/ACT AERS respimat [id number] No Inhale 1  puff into the lungs every 6 (six) hours as needed for wheezing.  Patient not taking: Reported on 09/16/2022   [provider] Not Taking Active Self  meclizine (ANTIVERT) 25 MG tablet [id number]  Take 1 tablet (25 mg total) by mouth 3 (three) times daily as needed for dizziness. Patrecia Pour, MD  Active   nitroGLYCERIN (NITROSTAT) 0.4 MG SL tablet [id number] Yes Place 1 tablet (0.4 mg total) under the tongue every 5 (five) minutes x 3 doses as needed for chest pain. Lorella Nimrod, MD Taking Active Self  omeprazole (PRILOSEC) 40 MG capsule [id number] Yes Take 1 capsule (40 mg total) by mouth 2 (two) times daily before a meal for 30 days, THEN 1 capsule (40 mg total) daily before breakfast. Vanga, Tally Due, MD Taking Active   potassium chloride SA (KLOR-CON M) 20 MEQ tablet [id number] Yes Take 1 tablet (20 mEq total) by mouth every other day. Bensimhon, Shaune Pascal, MD Taking Active   rivaroxaban (XARELTO) 20 MG TABS tablet [id number] Yes Take 1 tablet (20 mg total) by mouth daily with supper. Shirley Friar, PA-C Taking Active  Med Note Jodi Mourning, Deivi Huckins T   Thu May 30, 2022  1:36 PM)    rosuvastatin (CRESTOR) 40 MG tablet [id number] Yes Take 1 tablet (40 mg total) by mouth daily. Minna Merritts, MD Taking Active   sacubitril-valsartan [facility]) 97-103 MG [id number] Yes Take 1 tablet by mouth 2 (two) times daily. Bensimhon, Shaune Pascal, MD Taking Active   Semaglutide, 1 MG/DOSE, 4 MG/3ML SOPN [id number] Yes Inject 1 mg as directed once a week. [name], FNP Taking Active   spironolactone (ALDACTONE) 25 MG tablet [id number] Yes Take 1 tablet (25 mg total) by mouth daily. Alisa Graff, FNP Taking Active   torsemide (DEMADEX) 20 MG tablet [id number] Yes Take 2 tablets (40 mg total) by mouth daily. Take an extra 20 mg tablet daily as needed for edema Bensimhon, Shaune Pascal, MD Taking Active               Assessment/Plan:   Diabetes: - Currently uncontrolled but  significantly improved  - Recommend increasing Ozempic to 2 mg weekly. Will discuss with PCP - Recommend to continue current regimen at this time  Hyperlipidemia/ASCVD Risk Reduction: - Currently LDL controlled, but patient concerned about elevated TG.  - Reviewed most recent cholesterol panel. Consider Vascepa. Brand is preferred on Medicaid.  - Encouraged patient to discuss increased need for nitroglycerin with PCP, likely follow up sooner with [name].  Heart Failure: - Currently appropriately managed - Recommend to continue current regimen  ?Gout: - History of elevated uric acid, patient notes he has never been treated. Encouraged to discuss with PCP tomorrow. Recommend initiation of allopurinol with colchicine overlap to reduce risk of flares. Patient also wants to discuss if rheumatology referral is appropriate given body/joint aches and rash.   Follow Up Plan: phone call in 6 weeks  Catie TJodi Mourning, PharmD, [place], [facility] [phone]

## 2022-09-16 NOTE — Patient Instructions (Addendum)
Mr. Baty,   It was great talking to you today!  I will let Daneil Dan know that you would like to talk about:   1) Possible gout, though also if related to body aches and rash 2) How often you are needing nitroglycerin lately  Today we discussed:  1) Increase Ozempic to 2 mg weekly 2) Start Vascepa 2 capsules twice daily. This medication can help lower your triglycerides and protect your heart.   Call me with any questions or concerns!  Catie Hedwig Morton, PharmD, [place], [facility] [phone]

## 2022-09-17 ENCOUNTER — Encounter: Payer: Self-pay | Admitting: Family Medicine

## 2022-09-17 ENCOUNTER — Ambulatory Visit (INDEPENDENT_AMBULATORY_CARE_PROVIDER_SITE_OTHER): Payer: Medicaid Other | Admitting: Family Medicine

## 2022-09-17 VITALS — BP 125/77 | HR 87 | Temp 97.7°F | Wt 246.2 lb

## 2022-09-17 DIAGNOSIS — M79605 Pain in left leg: Secondary | ICD-10-CM | POA: Diagnosis not present

## 2022-09-17 DIAGNOSIS — N1832 Chronic kidney disease, stage 3b: Secondary | ICD-10-CM

## 2022-09-17 DIAGNOSIS — Z794 Long term (current) use of insulin: Secondary | ICD-10-CM | POA: Diagnosis not present

## 2022-09-17 DIAGNOSIS — M79604 Pain in right leg: Secondary | ICD-10-CM | POA: Diagnosis not present

## 2022-09-17 DIAGNOSIS — L4050 Arthropathic psoriasis, unspecified: Secondary | ICD-10-CM

## 2022-09-17 DIAGNOSIS — E0822 Diabetes mellitus due to underlying condition with diabetic chronic kidney disease: Secondary | ICD-10-CM

## 2022-09-17 DIAGNOSIS — R0789 Other chest pain: Secondary | ICD-10-CM | POA: Diagnosis not present

## 2022-09-17 DIAGNOSIS — G8929 Other chronic pain: Secondary | ICD-10-CM | POA: Insufficient documentation

## 2022-09-17 DIAGNOSIS — M1A39X0 Chronic gout due to renal impairment, multiple sites, without tophus (tophi): Secondary | ICD-10-CM | POA: Insufficient documentation

## 2022-09-17 MED ORDER — ALLOPURINOL 100 MG PO TABS: 100.0000 mg | ORAL_TABLET | Freq: Every day | ORAL | 6 refills | Status: DC

## 2022-09-17 MED ORDER — COLCHICINE 0.6 MG PO TABS: ORAL_TABLET | ORAL | 0 refills | Status: DC

## 2022-09-17 MED ORDER — NITROGLYCERIN 0.4 MG SL SUBL: 0.4000 mg | SUBLINGUAL_TABLET | SUBLINGUAL | 0 refills | Status: DC | PRN

## 2022-09-17 NOTE — Assessment & Plan Note (Signed)
Chronic, worsening CHF team concerned for gout vs psoriatic arthritis  Will repeat uric acid level given known CKD variation as well as diuretic use +uric acid; continue with plan as discussed. OK to continue with rheum referral for arthritis r/o

## 2022-09-17 NOTE — Assessment & Plan Note (Signed)
Hx of gout; worse in ~1 year since being compliant with HF treatment Unable to be seen by derm until 6/24 Unable to tolerate oral prednisone d/t side effects including hyperglycemia  Referral to rheum for assistance

## 2022-09-17 NOTE — Assessment & Plan Note (Signed)
Secure chat to Mallie Snooks, LAMBERT Refill of Nitro SL tabs Encouraged to seek emergent care if not resolved following use of 3 nitro Reports use of 1-2 nitro to assist with current symptoms; symptoms wake him 2-3 times a week at night

## 2022-09-17 NOTE — Assessment & Plan Note (Addendum)
Uric acid elevated previously; repeat labs given current complaints of foot and leg pain Trial of allopurinol daily with PRN colchicine

## 2022-09-17 NOTE — Assessment & Plan Note (Signed)
Chronic, previously stable Working with Jodi Mourning PharmD with Ozempic dosing Patient has been working on DM diet Continues to be limited in functional capacity for purposeful exercise Unable to void for repeat urine micro albumin

## 2022-09-18 ENCOUNTER — Encounter: Payer: Self-pay | Admitting: Family Medicine

## 2022-09-18 LAB — URIC ACID: Uric Acid: 8.5 mg/dL — ABNORMAL HIGH (ref 3.8–8.4)

## 2022-09-18 NOTE — Progress Notes (Signed)
Uric acid remains elevated; continue plan to start allopurinol daily with use of colchicine for flares. Can repeat labs at next appt to gauge improvement in uric acid levels.

## 2022-09-19 IMAGING — CR DG CHEST 2V
2 series · 2 of 2 positions shown · non-contrast
Comparison: 03/01/2021

CLINICAL DATA: Chest pain

EXAM:
CHEST - 2 VIEW

[chest pa]
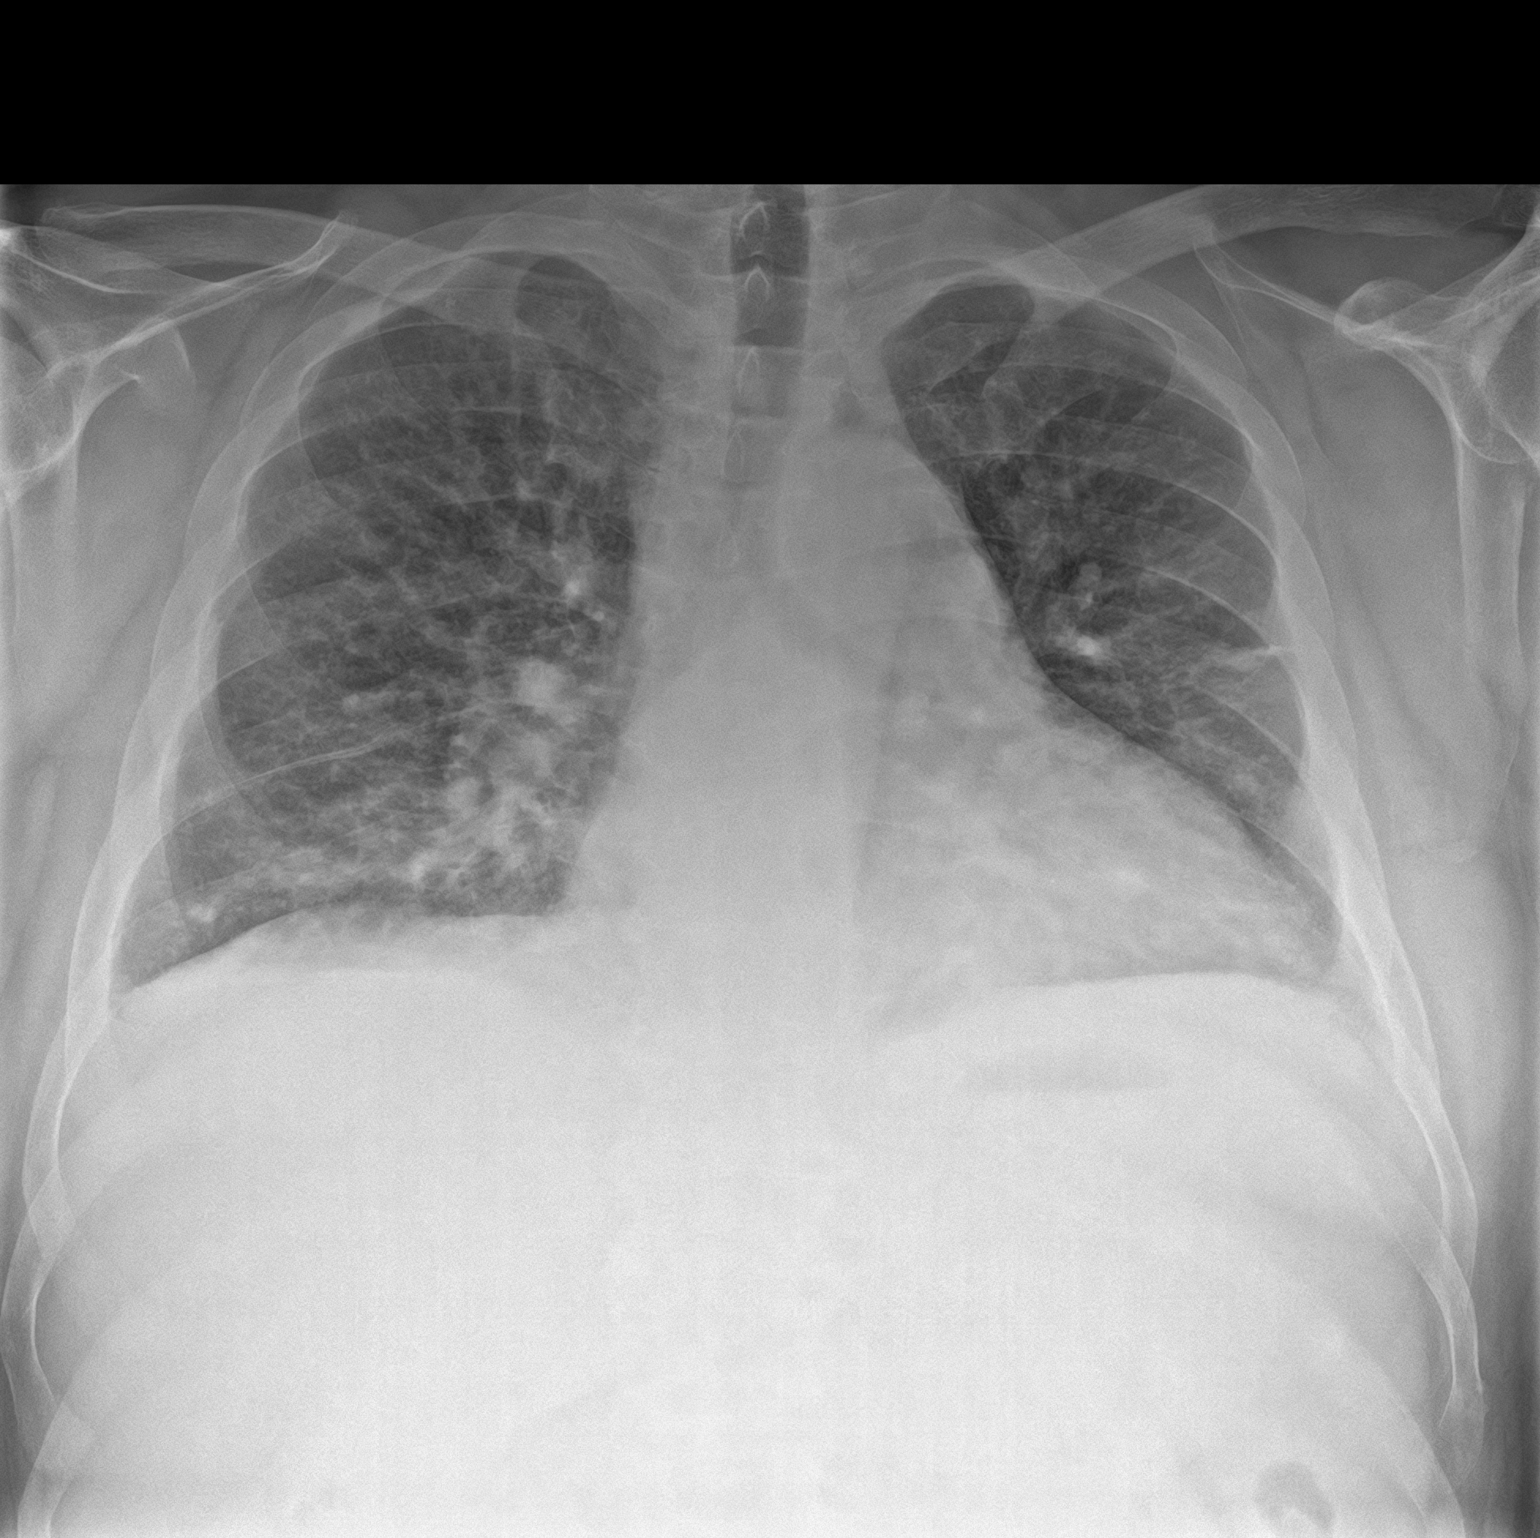

[chest lat]
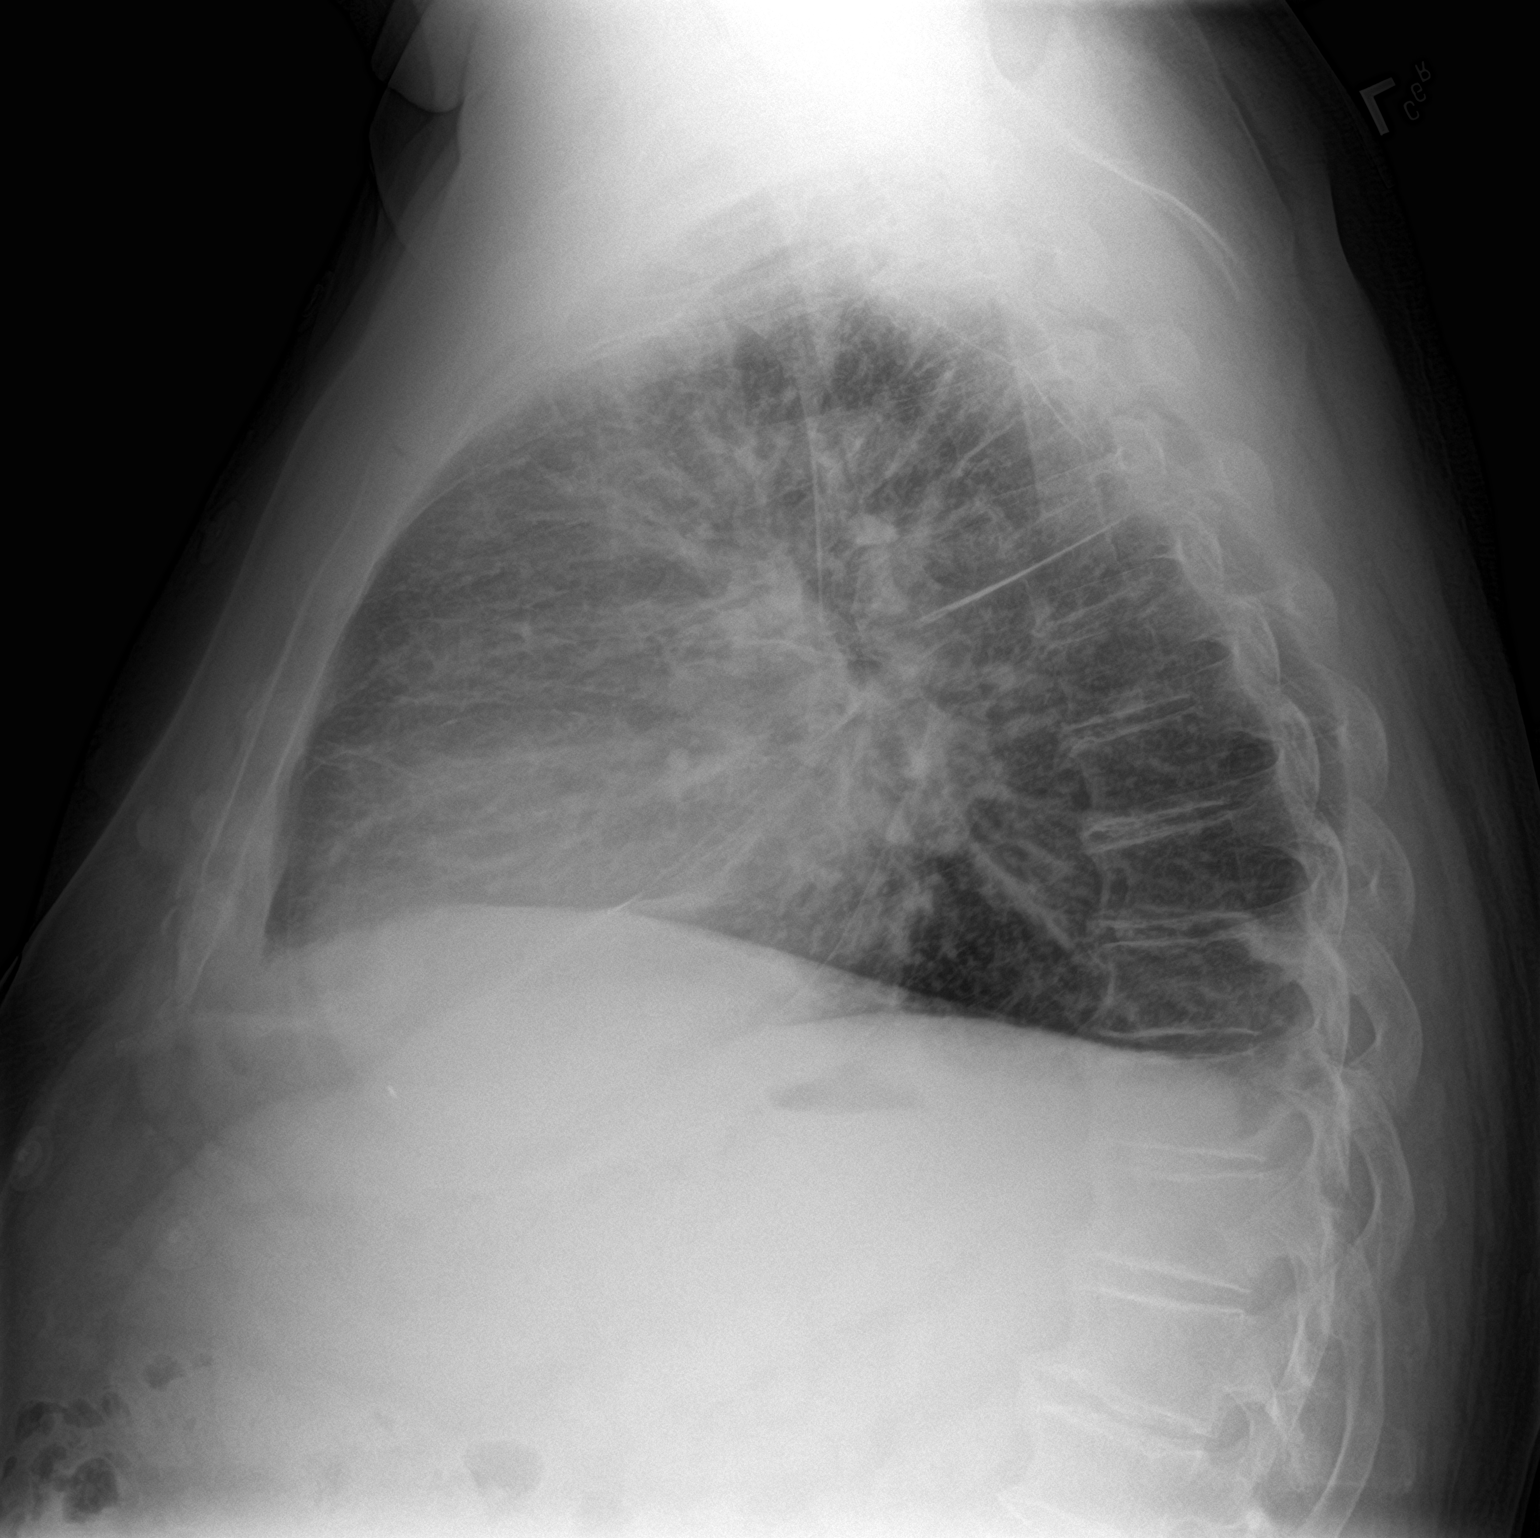

[2 of 2 positions shown; findings below may reference images not displayed]

FINDINGS: Stable cardiomediastinal contours. Low lung volumes with bibasilar
atelectasis. Chronically coarsened interstitial markings
bilaterally. No pleural effusion or pneumothorax.
IMPRESSION: Low lung volumes with bibasilar atelectasis.

## 2022-09-19 IMAGING — US US ABDOMEN LIMITED
1 series · 14 of 25 positions shown · non-contrast
Comparison: CT abdomen pelvis 03/28/2021. Ultrasound abdomen
02/19/2021

CLINICAL DATA: Right upper quadrant pain

EXAM:
ULTRASOUND ABDOMEN LIMITED RIGHT UPPER QUADRANT

[Series 1: us abdomen limited ruq (liver/gb) · 14 of 55 slices shown]
[im 1/55]
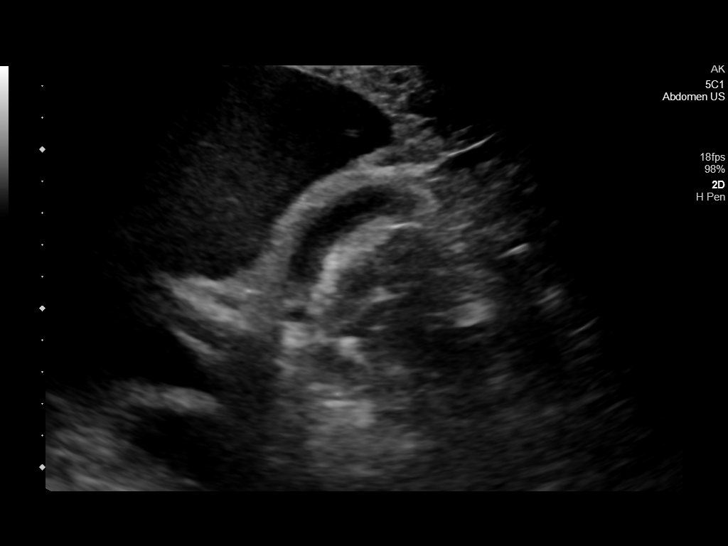
[im 5/55]
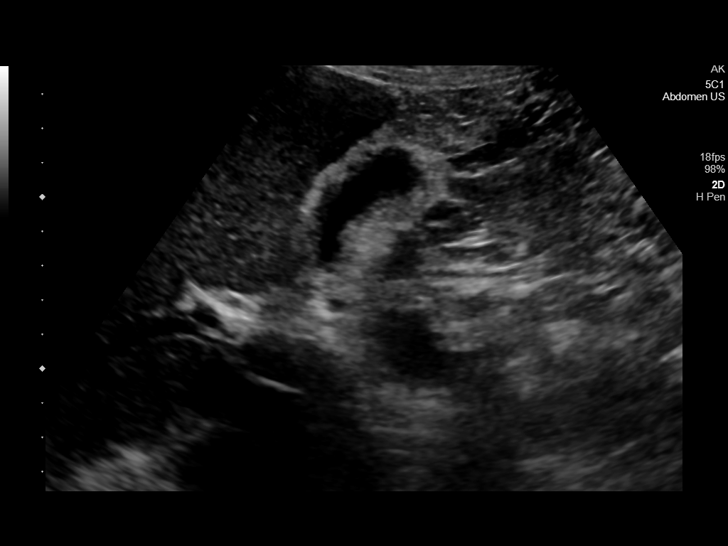
[im 10/55]
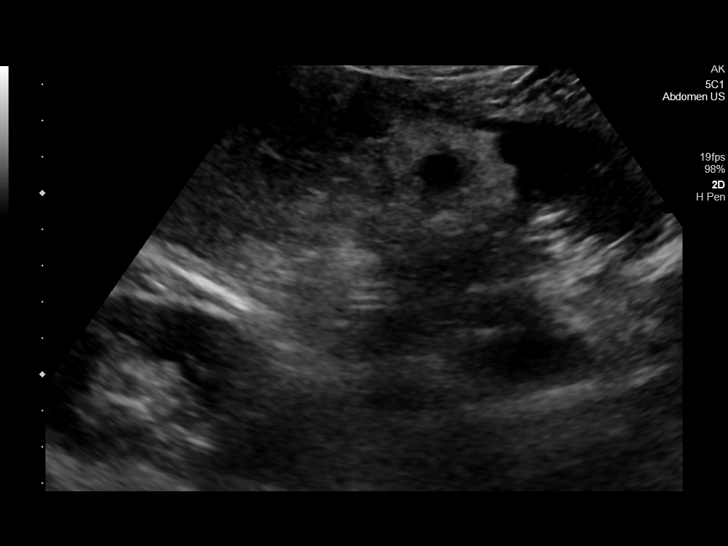
[im 14/55]
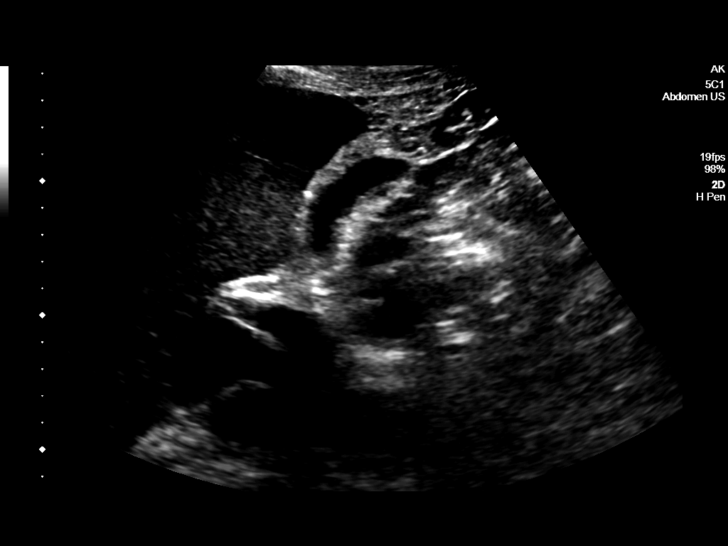
[im 19/55]
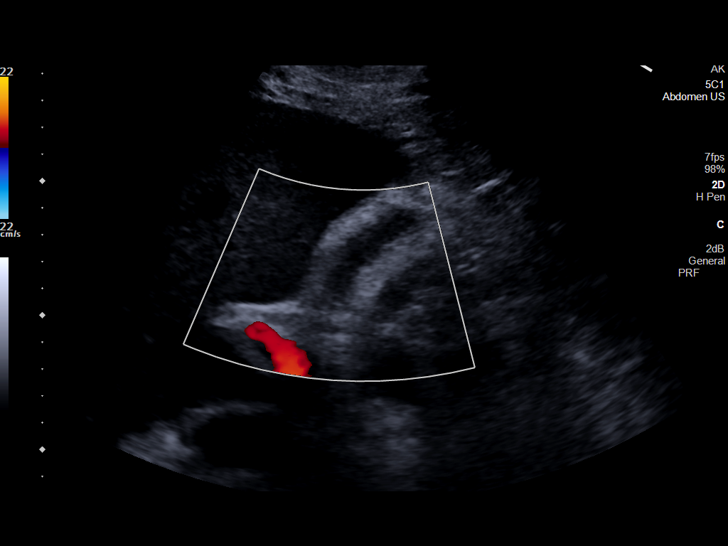
[im 21/55]
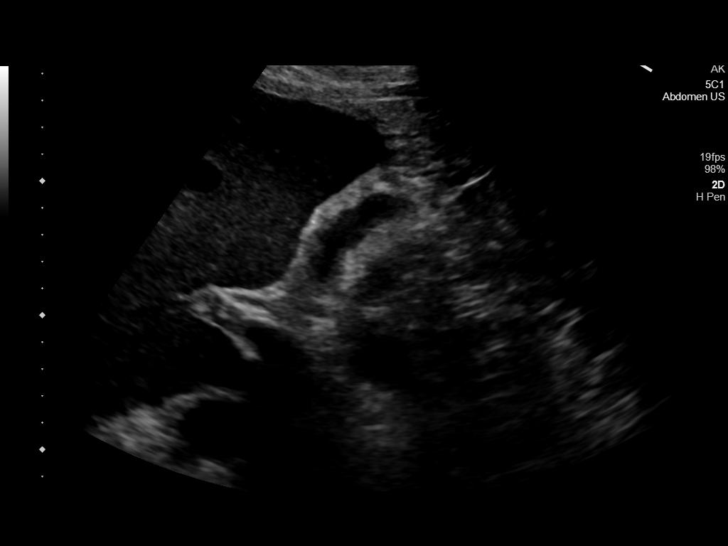
[im 25/55]
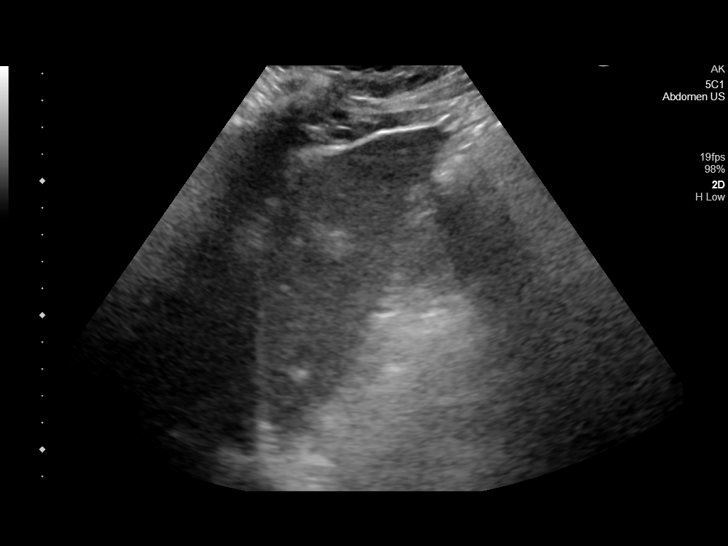
[im 30/55]
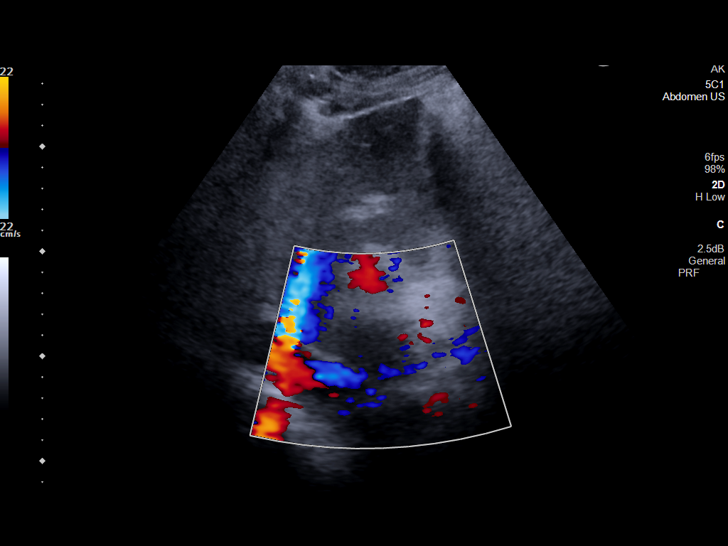
[im 34/55]
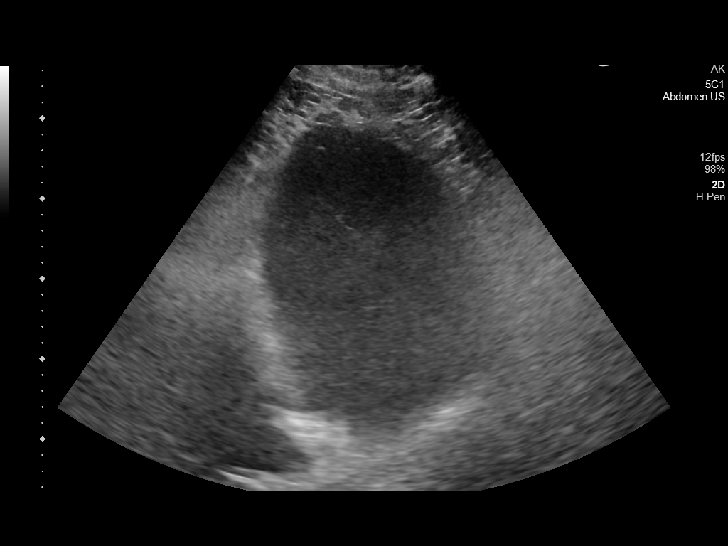
[im 37/55]
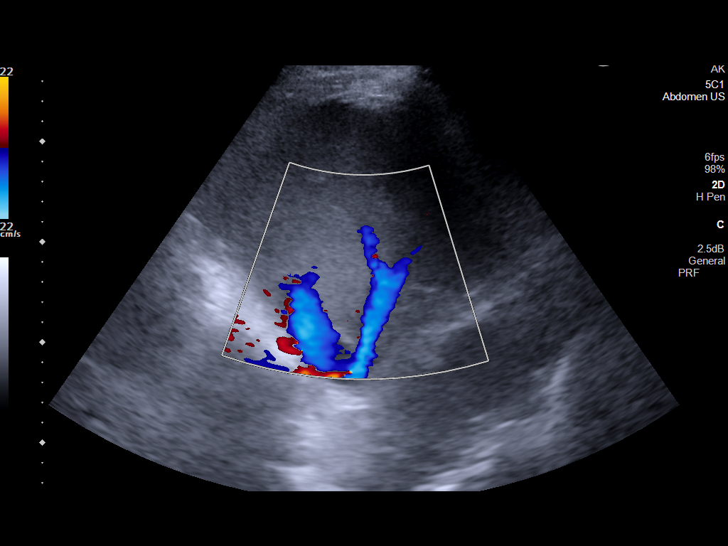
[im 41/55]
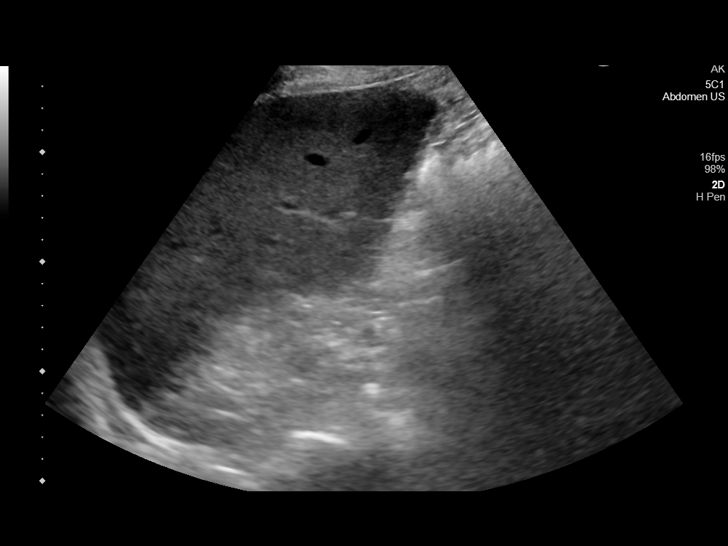
[im 46/55]
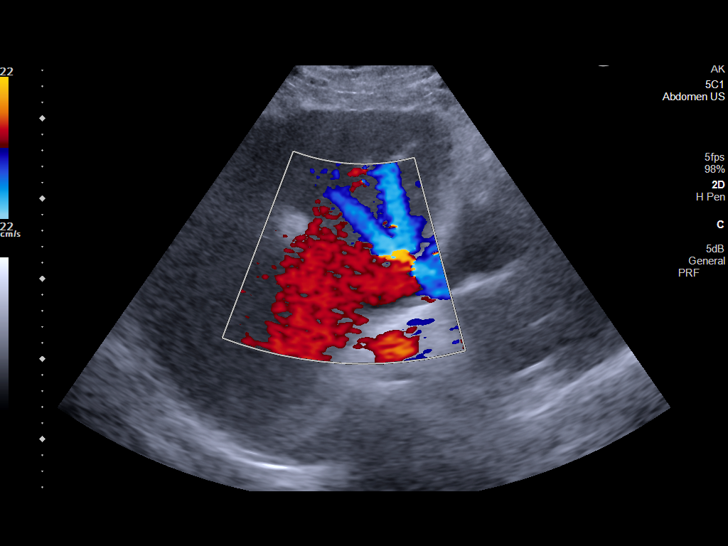
[im 50/55]
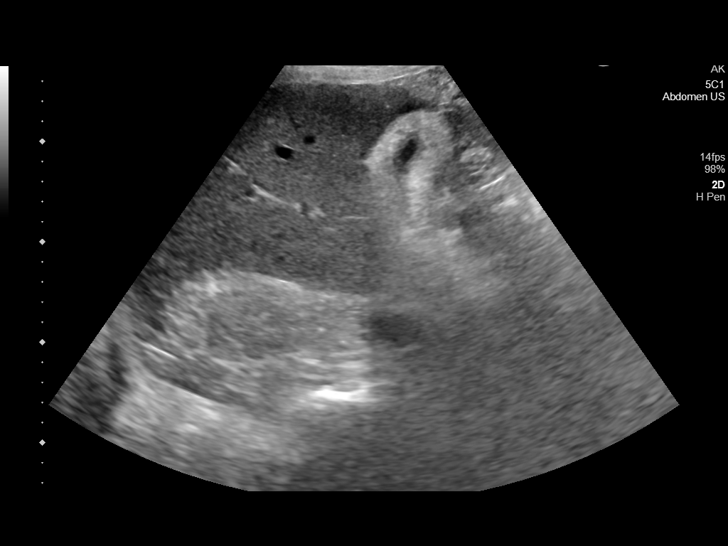
[im 55/55]
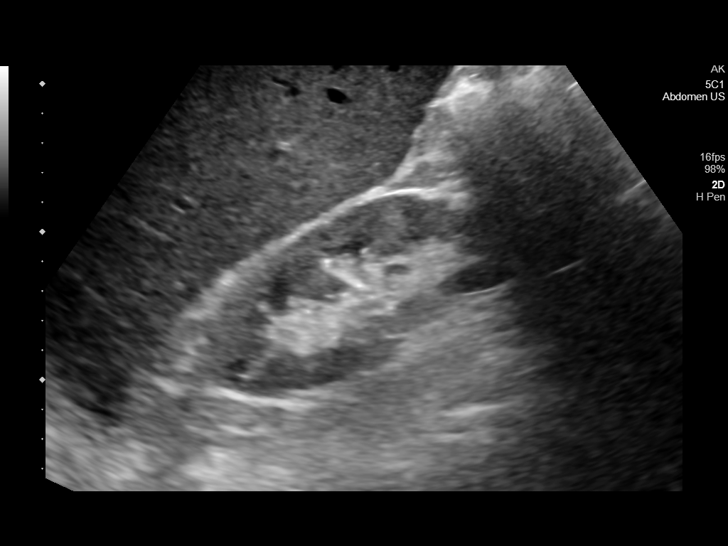

[14 of 25 positions shown; findings below may reference images not displayed]

FINDINGS: Gallbladder:

Contracted gallbladder with diffuse gallbladder wall thickening
measuring 7 mm. Negative sonographic Murphy sign. No gallstones
identified.

Common bile duct:

Diameter: 5 mm

Liver:

Mild increased echogenicity liver parenchyma without focal liver
lesion. Portal vein is patent on color Doppler imaging with normal
direction of blood flow towards the liver.

Other: None.
IMPRESSION: Contracted gallbladder with gallbladder wall thickening. Negative
for gallstones or gallbladder wall thickening. Edema in the
pericholecystic fat on CT suggestive of acalculous cholecystitis. No
biliary dilatation.

## 2022-09-19 NOTE — Progress Notes (Signed)
Remote ICD transmission.   

## 2022-09-20 IMAGING — NM NM HEPATOBILIARY IMAGE, INC GB
1 series · 6 of 6 positions shown · non-contrast
Comparison: CT 03/28/2021

CLINICAL DATA: Right upper quadrant pain. Pericholecystic
inflammatory changes suspected on recent CT. Ultrasound shows no
calculi or gallbladder distension.

EXAM:
NUCLEAR MEDICINE HEPATOBILIARY IMAGING
TECHNIQUE: Sequential images of the abdomen were obtained [DATE] minutes
following intravenous administration of radiopharmaceutical.
Patient had been administered 2 mg morphine sulfate on the floor
preprocedure.
RADIOPHARMACEUTICALS:  5.34 mCi Yc-XXm  Choletec IV

[Series 1000: hepatobiliary scan · 9.59mm/px · 6 of 60 frames shown]
[frame 6/60]
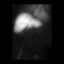
[frame 16/60]
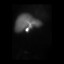
[frame 26/60]
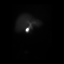
[frame 36/60]
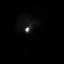
[frame 46/60]
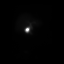
[frame 56/60]
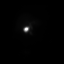

[6 of 6 positions shown; findings below may reference images not displayed]

FINDINGS: Prompt uptake and biliary excretion of activity by the liver is
seen. Gallbladder activity is visualized, consistent with patency of
cystic duct. Biliary activity passes into small bowel, consistent
with patent common bile duct.
IMPRESSION: Patent cystic and common bile ducts.

## 2022-09-30 ENCOUNTER — Encounter: Payer: Self-pay | Admitting: Gastroenterology

## 2022-09-30 ENCOUNTER — Telehealth: Payer: Self-pay

## 2022-09-30 ENCOUNTER — Ambulatory Visit (INDEPENDENT_AMBULATORY_CARE_PROVIDER_SITE_OTHER): Payer: Medicaid Other | Admitting: Gastroenterology

## 2022-09-30 ENCOUNTER — Other Ambulatory Visit: Payer: Self-pay

## 2022-09-30 VITALS — BP 131/83 | HR 77 | Temp 98.1°F | Ht 72.0 in | Wt 246.4 lb

## 2022-09-30 DIAGNOSIS — R1013 Epigastric pain: Secondary | ICD-10-CM | POA: Diagnosis not present

## 2022-09-30 DIAGNOSIS — K529 Noninfective gastroenteritis and colitis, unspecified: Secondary | ICD-10-CM | POA: Diagnosis not present

## 2022-09-30 DIAGNOSIS — Z8711 Personal history of peptic ulcer disease: Secondary | ICD-10-CM

## 2022-09-30 NOTE — Telephone Encounter (Signed)
Patient with history of PE on Xarelto 20 mg for anticoagulation.    Procedure:  EGD   Date of procedure: 10/16/2022    CrCl 72 mL/min (using Adj Wt) Platelet count 209 K  Per office protocol, patient can hold Xarelto for 1-2 days prior to procedure.     **This guidance is not considered finalized until pre-operative APP has relayed final recommendations.**

## 2022-09-30 NOTE — Telephone Encounter (Signed)
Per cardiology 2 day prior to procedure. Called and informed patient of this instructions

## 2022-09-30 NOTE — Patient Instructions (Addendum)
Your Gastric emptying study is schedule for 11/01/2022 arrive at 9:00am for 9:30am scan. Nothing to eat or drink after midnight.  Hold GI medication after midnight. If you need to reschedule please call [phone] option 3 and then option 2.

## 2022-09-30 NOTE — Telephone Encounter (Signed)
   Patient Name: Gabriel [name].  DOB: 10-06-1970 MRN: [id number]  Primary Cardiologist: [name], MD  Clinical pharmacists have reviewed the patient's past medical history, labs, and current medications as part of preoperative protocol coverage. The following recommendations have been made:   Per office protocol, patient can hold Xarelto for 1-2 days prior to procedure.     I will route this recommendation to the requesting party via Epic fax function and remove from pre-op pool.  Please call with questions.  Mable Fill, Marissa Nestle, NP 09/30/2022, 3:23 PM

## 2022-09-30 NOTE — Telephone Encounter (Signed)
Pharmacy please advise on holding Xarelto prior to EGD scheduled for 10/16/2022. Thank you.

## 2022-09-30 NOTE — Progress Notes (Addendum)
[name], MD [address]  [place]  [place], [place] [address]  Main: [phone]  Fax: [phone]    Gastroenterology Consultation  Referring Provider:     Jolaine Artist, MD Primary Care Physician:  [name], FNP Primary Gastroenterologist:  Dr. [name] Reason for Consultation: Upper abdominal pain        HPI:   Gabriel [name]. is a 52 y.o. male referred by [name], FNP  for consultation & management of chronic history of upper abdominal pain associated with sulfur burps, postprandial abdominal discomfort, burping and regurgitation.  This has been ongoing for at least a year.  He has multiple comorbidities including metabolic syndrome, CHF, CKD, compensated cirrhosis of liver.  Patient used to smoke and drink alcohol, quit both at least 2 to 3 years ago.  He has defibrillator in place.  He is trying to manage his diabetes by following the diet and adherence to his medications.  Follow-up visit 09/30/2022 Patient is here for follow-up of ongoing symptoms as described during his initial visit.  He underwent upper endoscopy which revealed gastric erosions and superficial clean-based gastric ulcer.  No evidence of H. pylori.  Colonoscopy was unremarkable.  I started him on omeprazole 40 mg twice daily before meals which he has been taking.  Patient reports that he noticed temporary improvement in his symptoms but since December, they have been the same as before.  He also reports change in bowel habits, alternating between normal and loose bowel movements.  He has stopped smoking and is not drinking alcohol.  Continues to take Ozempic along with insulin  NSAIDs: None  Antiplts/Anticoagulants/Anti thrombotics: Xarelto  GI Procedures:  EGD and colonoscopy 08/01/2022 DIAGNOSIS:  A. 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  - NEGATIVE FOR H. PYLORI, INTESTINAL METAPLASIA, DYSPLASIA, AND   MALIGNANCY.   B. COLON POLYP X 2, DESCENDING; COLD SNARE:  - TUBULAR ADENOMA (1).  - HYPERPLASTIC POLYP (1).  - NEGATIVE FOR HIGH-GRADE DYSPLASIA AND MALIGNANCY.  He denies family history of GI malignancy  Past Medical History:  Diagnosis Date   CAD (coronary artery disease)    a. 04/2015 low risk MV;  b. 12/2016 Cath: minor irregs in LAD/Diag/LCX/OM, RCA 40p/m/d; c. 05/2020 Cath: LM nl, LAD 50d, LCX 30p, OM1 40, RCA 100p w/ L->R collats. CO/CI 3.1/1.3-->Med rx.   Chest wall pain, chronic    Chronic Troponin Elevation    CKD (chronic kidney disease), stage II-III    COPD (chronic obstructive pulmonary disease) (HCC)    Diabetes mellitus without complication ([place])    HFrEF (heart failure with reduced ejection fraction) ([place])    a. 03/2015 Echo: EF 45-50%; b. 12/2015 Echo: EF 20-25%; c. 02/2016 Echo: EF 30-35%; d. 11/2016 Echo: EF 40-45%; e. 06/2019 Echo: EF 30-35%; f. 11/2019 Echo: EF 25-30%; g. 05/2020 Echo: EF 35-40%; h. 05/2021 Echo: EF 40-45%; i. 11/2021 Echo: EF 20-25%, glob HK, mild LVH, GrIII DD, sev red RV fxn, mild MR.   Hypertension    Mitral regurgitation    Mild to moderate by October 2021 echocardiogram.   Mixed Ischemic & NICM (nonischemic cardiomyopathy) ([place])    a. 03/2015 Echo: EF 45-50%; b. 12/2015 Echo: EF 20-25%; c. 02/2016 Echo: EF 30-35%; d. 11/2016 Echo: EF 40-45%; e. 06/2019 Echo: EF 30-35%; f. 11/2019 Echo: EF 25-30%; g. 05/2020 Echo: EF 35-40%; h. 05/2021 Echo: EF 40-45%; i. 11/2021 Echo: EF 20-25%  Myocardial infarct [facility])    NSVT (nonsustained ventricular tachycardia) ([place])    a. 12/2015 noted on tele-->amio;  b. 12/2015 Event monitor: no VT noted.   Obesity (BMI 30.0-34.9)    Psoriasis    Recurrent pulmonary emboli ([place]) 06/07/2020   06/07/20: small bilateral PEs.  12/31/19: RUL and RLL PEs.   Syncope    a. 01/2016 - felt to be vasovagal.    Past Surgical History:  Procedure Laterality Date   AMPUTATION     CARDIAC CATHETERIZATION     COLONOSCOPY WITH PROPOFOL N/A  08/01/2022   Procedure: COLONOSCOPY WITH PROPOFOL;  Surgeon: Lin Landsman, MD;  Location[facility] ENDOSCOPY;  Service: Gastroenterology;  Laterality: N/A;   ESOPHAGOGASTRODUODENOSCOPY (EGD) WITH PROPOFOL N/A 08/01/2022   Procedure: ESOPHAGOGASTRODUODENOSCOPY (EGD) WITH PROPOFOL;  Surgeon: Lin Landsman, MD;  Location: [facility] ENDOSCOPY;  Service: Gastroenterology;  Laterality: N/A;   FINGER AMPUTATION     Traumatic   FINGER FRACTURE SURGERY Left    FRACTURE SURGERY     LEFT HEART CATH AND CORONARY ANGIOGRAPHY N/A 01/06/2017   Procedure: Left Heart Cath and Coronary Angiography;  Surgeon: Wellington Hampshire, MD;  Location: [place] CV LAB;  Service: Cardiovascular;  Laterality: N/A;   RIGHT/LEFT HEART CATH AND CORONARY ANGIOGRAPHY N/A 06/13/2020   Procedure: RIGHT/LEFT HEART CATH AND CORONARY ANGIOGRAPHY;  Surgeon: Nelva Bush, MD;  Location: [place] CV LAB;  Service: Cardiovascular;  Laterality: N/A;   SUBQ ICD IMPLANT N/A 05/20/2022   Procedure: SUBQ ICD IMPLANT;  Surgeon: Vickie Epley, MD;  Location: [place] CV LAB;  Service: Cardiovascular;  Laterality: N/A;     Current Outpatient Medications:    allopurinol (ZYLOPRIM) 100 MG tablet, Take 1 tablet (100 mg total) by mouth daily., Disp: 30 tablet, Rfl: 6   blood glucose meter kit and supplies KIT, Dispense based on patient and insurance preference. Use up to four times daily as directed., Disp: 1 each, Rfl: 11   carvedilol (COREG) 3.125 MG tablet, Take 1 tablet (3.125 mg total) by mouth 2 (two) times daily with a meal., Disp: 60 tablet, Rfl: 3   clobetasol (TEMOVATE) 0.05 % external solution, Apply 1 Application topically 2 (two) times daily., Disp: 50 mL, Rfl: 1   colchicine 0.6 MG tablet, Day 1: Take 2 tablets PO for gout flare, repeat 1 tablet one hour following initial dose. Day 2: Take 1 tablet twice daily PO for an additional 2-3 days., Disp: 60 tablet, Rfl: 0   Continuous Blood Gluc Sensor (FREESTYLE  LIBRE 3 SENSOR) MISC, Place 1 sensor on the skin every 14 days. Use to check glucose continuously, Disp: 6 each, Rfl: 3   dapagliflozin propanediol (FARXIGA) 10 MG TABS tablet, Take 1 tablet (10 mg total) by mouth daily., Disp: 30 tablet, Rfl: 5   fenofibrate (TRICOR) 145 MG tablet, Take 1 tablet (145 mg total) by mouth daily., Disp: 90 tablet, Rfl: 3   FLUoxetine (PROZAC) 20 MG capsule, Take 1 capsule (20 mg total) by mouth daily., Disp: 90 capsule, Rfl: 3   Furosemide (FUROSCIX) 80 MG/10ML CTKT, Inject 1 Dose into the skin as needed (edema)., Disp: 6 each, Rfl: 0   icosapent Ethyl (VASCEPA) 1 g capsule, Take 2 capsules (2 g total) by mouth 2 (two) times daily., Disp: 360 capsule, Rfl: 1   insulin aspart (NOVOLOG) 100 UNIT/ML FlexPen, Inject 6 units + correction before breakfast/lunch and 9 units + correction before supper (correction = 1 unit per 50 mg/dL above 150); max daily dose  36 units, Disp: 15 mL, Rfl: 3   Insulin Glargine (BASAGLAR KWIKPEN) 100 UNIT/ML, Inject 36 Units into the skin at bedtime., Disp: 30 mL, Rfl: 3   Insulin Pen Needle (PEN NEEDLES) 32G X 4 MM MISC, 200 each by Does not apply route at bedtime., Disp: 200 each, Rfl: 3   Ipratropium-Albuterol (COMBIVENT RESPIMAT) 20-100 MCG/ACT AERS respimat, Inhale 1 puff into the lungs every 6 (six) hours as needed for wheezing., Disp: , Rfl:    meclizine (ANTIVERT) 25 MG tablet, Take 1 tablet (25 mg total) by mouth 3 (three) times daily as needed for dizziness., Disp: 15 tablet, Rfl: 0   nitroGLYCERIN (NITROSTAT) 0.4 MG SL tablet, Place 1 tablet (0.4 mg total) under the tongue every 5 (five) minutes x 3 doses as needed for chest pain., Disp: 30 tablet, Rfl: 0   omeprazole (PRILOSEC) 40 MG capsule, Take 1 capsule (40 mg total) by mouth 2 (two) times daily before a meal for 30 days, THEN 1 capsule (40 mg total) daily before breakfast., Disp: 150 capsule, Rfl: 0   potassium chloride SA (KLOR-CON M) 20 MEQ tablet, Take 1 tablet (20 mEq total) by  mouth every other day., Disp: 15 tablet, Rfl: 6   rivaroxaban (XARELTO) 20 MG TABS tablet, Take 1 tablet (20 mg total) by mouth daily with supper., Disp: 90 tablet, Rfl: 3   rosuvastatin (CRESTOR) 40 MG tablet, Take 1 tablet (40 mg total) by mouth daily., Disp: 90 tablet, Rfl: 3   sacubitril-valsartan (ENTRESTO) 97-103 MG, Take 1 tablet by mouth 2 (two) times daily., Disp: 60 tablet, Rfl: 6   Semaglutide, 2 MG/DOSE, 8 MG/3ML SOPN, Inject 2 mg as directed once a week., Disp: 9 mL, Rfl: 1   spironolactone (ALDACTONE) 25 MG tablet, Take 1 tablet (25 mg total) by mouth daily., Disp: 90 tablet, Rfl: 3   torsemide (DEMADEX) 20 MG tablet, Take 2 tablets (40 mg total) by mouth daily. Take an extra 20 mg tablet daily as needed for edema, Disp: 75 tablet, Rfl: 6   Family History  Problem Relation Age of Onset   Diabetes Mother    Diabetes Mellitus II Mother    Hypothyroidism Mother    Hypertension Mother    Kidney failure Mother        Dialysis   Heart attack Mother        39 yo approximately   Hypertension Father    Gout Father    Cancer Maternal Grandfather    Diabetes Maternal Grandfather    Cancer Paternal Aunt      Social History   Tobacco Use   Smoking status: Former    Packs/day: 0.50    Years: 33.00    Total pack years: 16.50    Types: Cigarettes    Quit date: 05/08/2020    Years since quitting: 2.3   Smokeless tobacco: Former    Quit date: 05/08/2020   Tobacco comments:    Quit Sept 2021  Vaping Use   Vaping Use: Former  Substance Use Topics   Alcohol use: Not Currently    Comment: occassionally   Drug use: No    Allergies as of 09/30/2022 - Review Complete 09/30/2022  Allergen Reaction Noted   Metformin and related Nausea And Vomiting 11/22/2019   Zantac [ranitidine hcl] Diarrhea and Nausea Only 08/27/2018    Review of Systems:    All systems reviewed and negative except where noted in HPI.   Physical Exam:  BP 131/83 (BP Location: Left Arm, Patient  Position:  Sitting, Cuff Size: Large)   Pulse 77   Temp 98.1 F (36.7 C) (Oral)   Ht 6' (1.829 m)   Wt 246 lb 6 oz (111.8 kg)   BMI 33.41 kg/m  No LMP for male patient.  General:   Alert,  Well-developed, well-nourished, pleasant and cooperative in NAD Head:  Normocephalic and atraumatic. Eyes:  Sclera clear, no icterus.   Conjunctiva pink. Ears:  Normal auditory acuity. Nose:  No deformity, discharge, or lesions. Mouth:  No deformity or lesions,oropharynx pink & moist. Neck:  Supple; no masses or thyromegaly. Lungs:  Respirations even and unlabored.  Clear throughout to auscultation.   No wheezes, crackles, or rhonchi. No acute distress. Heart:  Regular rate and rhythm; no murmurs, clicks, rubs, or gallops. Abdomen:  Normal bowel sounds. Soft, non-tender and non-distended without masses, hepatosplenomegaly or hernias noted.  No guarding or rebound tenderness.   Rectal: Not performed Msk:  Symmetrical without gross deformities. Good, equal movement & strength bilaterally. Pulses:  Normal pulses noted. Extremities:  No clubbing or edema.  No cyanosis. Neurologic:  Alert and oriented x3;  grossly normal neurologically. Skin:  Intact without significant lesions or rashes. No jaundice. Psych:  Alert and cooperative. Normal mood and affect.  Imaging Studies: Reviewed  Assessment and Plan:   Gabriel [name]. is a 52 y.o. Caucasian male with metabolic syndrome, poorly controlled diabetes, CHF, nonischemic cardiomyopathy, nonobstructive coronary artery disease AICD, CKD is seen in consultation for compensated cirrhosis of liver, symptoms of dyspepsia  Compensated cirrhosis of liver: Secondary to [facility] Right upper quadrant ultrasound revealed nodularity of the liver with severe hepatic steatosis.  No evidence of portal hypertension EGD with no evidence of varices Maintain strict low-sodium diet No evidence of liver lesions based on the recent imaging Tight control of diabetes Continue to  remain abstinent from alcohol use  Chronic upper abdominal pain, burning in nature associated with nausea, frequent burping, change in bowel habits EGD revealed gastric erosions and superficial gastric ulcers, no evidence of H. pylori Continue omeprazole 40 mg twice daily Recommend repeat EGD to confirm healing of gastric ulcers Patient had right upper quadrant ultrasound in 4/23 which was normal, HIDA scan was negative CT abdomen pelvis with contrast in 03/2021 revealed normal-appearing pancreas Recommend gastric emptying study to evaluate for gastroparesis Check pancreatic fecal elastase levels ?  Side effect of Ozempic, will discuss with PCP if it is okay to hold Ozempic for a month and see if his symptoms improve If above workup is negative and symptoms are persistent, will refer to general surgery to evaluate for gallbladder etiology  Follow up in 4 months   [name], MD

## 2022-09-30 NOTE — Telephone Encounter (Signed)
   [facility] Pre-operative Risk Assessment    Request for surgical clearance:  What type of surgery is being performed? EGD    When is this surgery scheduled? 10/16/2022   Are there any medications that need to be held prior to surgery and how long? Xarelto   Practice name and name of physician performing surgery? [facility] Dr. Marius Ditch   What is your office phone and fax number? [phone]   Anesthesia type (None, local, MAC, general) ? General    Gabriel [name] 09/30/2022, 1:42 PM  _________________________________________________________________   (provider comments below)

## 2022-10-08 ENCOUNTER — Ambulatory Visit: Payer: Medicaid Other

## 2022-10-08 LAB — PANCREATIC ELASTASE, FECAL: Pancreatic Elastase, Fecal: 219 ug Elast./g (ref >200)

## 2022-10-14 ENCOUNTER — Ambulatory Visit: Payer: Medicaid Other | Admitting: Student in an Organized Health Care Education/Training Program

## 2022-10-15 ENCOUNTER — Encounter: Payer: Self-pay | Admitting: Gastroenterology

## 2022-10-16 ENCOUNTER — Encounter: Payer: Self-pay | Admitting: Gastroenterology

## 2022-10-16 ENCOUNTER — Other Ambulatory Visit: Payer: Self-pay | Admitting: Gastroenterology

## 2022-10-16 ENCOUNTER — Ambulatory Visit
Admission: RE | Admit: 2022-10-16 | Discharge: 2022-10-16 | Disposition: A | Discharge status: Home / Self Care | Payer: Medicaid Other | Attending: Gastroenterology | Admitting: Gastroenterology

## 2022-10-16 ENCOUNTER — Ambulatory Visit: Payer: Medicaid Other | Admitting: Certified Registered"

## 2022-10-16 ENCOUNTER — Other Ambulatory Visit: Payer: Self-pay

## 2022-10-16 ENCOUNTER — Encounter
Admission: RE | Disposition: A | Discharge status: Home / Self Care | Payer: Self-pay | Source: Home / Self Care | Attending: Gastroenterology

## 2022-10-16 DIAGNOSIS — I252 Old myocardial infarction: Secondary | ICD-10-CM | POA: Insufficient documentation

## 2022-10-16 DIAGNOSIS — G4730 Sleep apnea, unspecified: Secondary | ICD-10-CM | POA: Diagnosis not present

## 2022-10-16 DIAGNOSIS — K319 Disease of stomach and duodenum, unspecified: Secondary | ICD-10-CM

## 2022-10-16 DIAGNOSIS — I110 Hypertensive heart disease with heart failure: Secondary | ICD-10-CM | POA: Diagnosis not present

## 2022-10-16 DIAGNOSIS — K2950 Unspecified chronic gastritis without bleeding: Secondary | ICD-10-CM | POA: Diagnosis not present

## 2022-10-16 DIAGNOSIS — K3189 Other diseases of stomach and duodenum: Secondary | ICD-10-CM | POA: Insufficient documentation

## 2022-10-16 DIAGNOSIS — I130 Hypertensive heart and chronic kidney disease with heart failure and stage 1 through stage 4 chronic kidney disease, or unspecified chronic kidney disease: Secondary | ICD-10-CM | POA: Diagnosis not present

## 2022-10-16 DIAGNOSIS — I25119 Atherosclerotic heart disease of native coronary artery with unspecified angina pectoris: Secondary | ICD-10-CM | POA: Insufficient documentation

## 2022-10-16 DIAGNOSIS — Z8711 Personal history of peptic ulcer disease: Secondary | ICD-10-CM | POA: Insufficient documentation

## 2022-10-16 DIAGNOSIS — E1122 Type 2 diabetes mellitus with diabetic chronic kidney disease: Secondary | ICD-10-CM | POA: Insufficient documentation

## 2022-10-16 DIAGNOSIS — N183 Chronic kidney disease, stage 3 unspecified: Secondary | ICD-10-CM | POA: Diagnosis not present

## 2022-10-16 DIAGNOSIS — Z87891 Personal history of nicotine dependence: Secondary | ICD-10-CM | POA: Diagnosis not present

## 2022-10-16 DIAGNOSIS — I5022 Chronic systolic (congestive) heart failure: Secondary | ICD-10-CM | POA: Diagnosis not present

## 2022-10-16 DIAGNOSIS — I509 Heart failure, unspecified: Secondary | ICD-10-CM | POA: Diagnosis not present

## 2022-10-16 DIAGNOSIS — R1013 Epigastric pain: Secondary | ICD-10-CM | POA: Diagnosis not present

## 2022-10-16 DIAGNOSIS — J449 Chronic obstructive pulmonary disease, unspecified: Secondary | ICD-10-CM | POA: Diagnosis not present

## 2022-10-16 DIAGNOSIS — K2960 Other gastritis without bleeding: Secondary | ICD-10-CM | POA: Diagnosis not present

## 2022-10-16 DIAGNOSIS — Z7901 Long term (current) use of anticoagulants: Secondary | ICD-10-CM | POA: Diagnosis not present

## 2022-10-16 HISTORY — PX: ESOPHAGOGASTRODUODENOSCOPY (EGD) WITH PROPOFOL: SHX5813

## 2022-10-16 LAB — GLUCOSE, CAPILLARY: Glucose-Capillary: 175 mg/dL — ABNORMAL HIGH (ref 70–99)

## 2022-10-16 SURGERY — ESOPHAGOGASTRODUODENOSCOPY (EGD) WITH PROPOFOL
Anesthesia: General

## 2022-10-16 MED ORDER — PROPOFOL 500 MG/50ML IV EMUL
INTRAVENOUS | Status: DC | PRN
  Administered 2022-10-16: 50 mg via INTRAVENOUS
  Administered 2022-10-16: 120 ug/kg/min via INTRAVENOUS
  Administered 2022-10-16: 50 mg via INTRAVENOUS

## 2022-10-16 MED ORDER — SODIUM CHLORIDE 0.9 % IV SOLN: INTRAVENOUS | Status: DC

## 2022-10-16 MED ORDER — GLYCOPYRROLATE 0.2 MG/ML IJ SOLN
INTRAMUSCULAR | Status: DC | PRN
  Administered 2022-10-16: .2 mg via INTRAVENOUS

## 2022-10-16 MED ORDER — LIDOCAINE HCL (CARDIAC) PF 100 MG/5ML IV SOSY
PREFILLED_SYRINGE | INTRAVENOUS | Status: DC | PRN
  Administered 2022-10-16: 100 mg via INTRAVENOUS

## 2022-10-16 MED ORDER — ONDANSETRON HCL 4 MG/2ML IJ SOLN
INTRAMUSCULAR | Status: DC | PRN
  Administered 2022-10-16: 4 mg via INTRAVENOUS

## 2022-10-16 MED ORDER — LACTATED RINGERS IV SOLN: INTRAVENOUS | Status: DC | PRN

## 2022-10-16 NOTE — Anesthesia Postprocedure Evaluation (Signed)
Anesthesia Post Note  Patient: Gabriel [name].  Procedure(s) Performed: ESOPHAGOGASTRODUODENOSCOPY (EGD) WITH PROPOFOL  Patient location during evaluation: Endoscopy Anesthesia Type: General Level of consciousness: awake and alert Pain management: pain level controlled Vital Signs Assessment: post-procedure vital signs reviewed and stable Respiratory status: spontaneous breathing, nonlabored ventilation, respiratory function stable and patient connected to nasal cannula oxygen Cardiovascular status: blood pressure returned to baseline and stable Postop Assessment: no apparent nausea or vomiting Anesthetic complications: no  No notable events documented.   Last Vitals:  Vitals:   10/16/22 0853 10/16/22 0913  BP: 108/84 127/89  Pulse:    Resp:    Temp: 36.6 C   SpO2:      Last Pain:  Vitals:   10/16/22 0913  TempSrc:   PainSc: 0-No pain                 Dimas Millin

## 2022-10-16 NOTE — Transfer of Care (Signed)
Immediate Anesthesia Transfer of Care Note  Patient: Orry Mancini.  Procedure(s) Performed: ESOPHAGOGASTRODUODENOSCOPY (EGD) WITH PROPOFOL  Patient Location: PACU  Anesthesia Type:MAC  Level of Consciousness: awake  Airway & Oxygen Therapy: Patient Spontanous Breathing  Post-op Assessment: Report given to RN  Post vital signs: Reviewed  Last Vitals:  Vitals Value Taken Time  BP 108/84 10/16/22 0853  Temp 36.6 C 10/16/22 0853  Pulse 120 10/16/22 0856  Resp 19 10/16/22 0856  SpO2 97 % 10/16/22 0856  Vitals shown include unvalidated device data.  Last Pain:  Vitals:   10/16/22 0853  TempSrc: Temporal  PainSc: 0-No pain         Complications: No notable events documented.

## 2022-10-16 NOTE — Op Note (Signed)
[facility] Gastroenterology Patient Name: Gabriel [name] Procedure Date: 10/16/2022 8:21 AM MRN: [id number] Account #: [id number] Date of Birth: 08/23/1970 Admit Type: Outpatient Age: 52 Room: [facility] ENDO ROOM 4 Gender: Male Note Status: Finalized Instrument Name: Upper Endoscope [id number] Procedure:             Upper GI endoscopy Indications:           Dyspepsia, Follow-up of peptic ulcer Providers:             Lin Landsman MD, MD Medicines:             General Anesthesia Complications:         No immediate complications. Estimated blood loss: None. Procedure:             Pre-Anesthesia Assessment:                        - Prior to the procedure, a History and Physical was                         performed, and patient medications and allergies were                         reviewed. The patient is competent. The risks and                         benefits of the procedure and the sedation options and                         risks were discussed with the patient. All questions                         were answered and informed consent was obtained.                         Patient identification and proposed procedure were                         verified by the physician, the nurse, the                         anesthesiologist, the anesthetist and the technician                         in the pre-procedure area in the procedure room in the                         endoscopy suite. Mental Status Examination: alert and                         oriented. Airway Examination: normal oropharyngeal                         airway and neck mobility. Respiratory Examination:                         clear to auscultation. CV Examination: normal.  Prophylactic Antibiotics: The patient does not require                         prophylactic antibiotics. Prior Anticoagulants: The                         patient has taken Xarelto (rivaroxaban), last dose was                          4 days prior to procedure. ASA Grade Assessment: III -                         A patient with severe systemic disease. After                         reviewing the risks and benefits, the patient was                         deemed in satisfactory condition to undergo the                         procedure. The anesthesia plan was to use general                         anesthesia. Immediately prior to administration of                         medications, the patient was re-assessed for adequacy                         to receive sedatives. The heart rate, respiratory                         rate, oxygen saturations, blood pressure, adequacy of                         pulmonary ventilation, and response to care were                         monitored throughout the procedure. The physical                         status of the patient was re-assessed after the                         procedure.                        After obtaining informed consent, the endoscope was                         passed under direct vision. Throughout the procedure,                         the patient's blood pressure, pulse, and oxygen                         saturations were monitored continuously. The Endoscope  was introduced through the mouth, and advanced to the                         second part of duodenum. The upper GI endoscopy was                         accomplished without difficulty. The patient tolerated                         the procedure well. Findings:      The duodenal bulb and second portion of the duodenum were normal.       Biopsies were taken with a cold forceps for histology.      Diffuse moderately erythematous mucosa without bleeding was found in the       gastric body, at the incisura and in the gastric antrum. Biopsies were       taken with a cold forceps for histology.      The gastroesophageal junction and examined esophagus were  normal. Impression:            - Normal duodenal bulb and second portion of the                         duodenum. Biopsied.                        - Erythematous mucosa in the gastric body, incisura                         and antrum. Biopsied.                        - Normal gastroesophageal junction and esophagus. Recommendation:        - Await pathology results.                        - Discharge patient to home (with escort).                        - Resume previous diet today.                        - Resume Xarelto (rivaroxaban) today at prior dose.                         Refer to managing physician for further adjustment of                         therapy. Procedure Code(s):     --- Professional ---                        [id number], Esophagogastroduodenoscopy, flexible,                         transoral; with biopsy, single or multiple Diagnosis Code(s):     --- Professional ---                        K31.89, Other diseases of stomach and duodenum  R10.13, Epigastric pain                        K27.9, Peptic ulcer, site unspecified, unspecified as                         acute or chronic, without hemorrhage or perforation CPT copyright 2022 American Medical Association. All rights reserved. The codes documented in this report are preliminary and upon coder review may  be revised to meet current compliance requirements. Dr. Ulyess Mort Lin Landsman MD, MD 10/16/2022 8:53:20 AM This report has been signed electronically. Number of Addenda: 0 Note Initiated On: 10/16/2022 8:21 AM Estimated Blood Loss:  Estimated blood loss: none.      [facility]

## 2022-10-16 NOTE — Anesthesia Preprocedure Evaluation (Signed)
Anesthesia Evaluation  Patient identified by MRN, date of birth, ID band Patient awake    Reviewed: Allergy & Precautions, NPO status , Patient's Chart, lab work & pertinent test results  Airway Mallampati: III  TM Distance: >3 FB Neck ROM: full    Dental  (+) Chipped, Dental Advidsory Given   Pulmonary shortness of breath and with exertion, sleep apnea and Continuous Positive Airway Pressure Ventilation , COPD,  COPD inhaler, former smoker   Pulmonary exam normal        Cardiovascular hypertension, + angina with exertion + Past MI, +CHF and + DOE  Normal cardiovascular exam     Neuro/Psych  PSYCHIATRIC DISORDERS      negative neurological ROS     GI/Hepatic negative GI ROS, Neg liver ROS,,,  Endo/Other  negative endocrine ROSdiabetes    Renal/GU Renal disease  negative genitourinary   Musculoskeletal   Abdominal   Peds  Hematology negative hematology ROS (+)   Anesthesia Other Findings Past Medical History: No date: CAD (coronary artery disease)     Comment:  a. 04/2015 low risk MV;  b. 12/2016 Cath: minor irregs in               LAD/Diag/LCX/OM, RCA 40p/m/d; c. 05/2020 Cath: LM nl, LAD              50d, LCX 30p, OM1 40, RCA 100p w/ L->R collats. CO/CI               3.1/1.3-->Med rx. No date: Chest wall pain, chronic No date: Chronic Troponin Elevation No date: CKD (chronic kidney disease), stage II-III No date: COPD (chronic obstructive pulmonary disease) ([place]) No date: Diabetes mellitus without complication ([facility]) No date: HFrEF (heart failure with reduced ejection fraction) ([place])     Comment:  a. 03/2015 Echo: EF 45-50%; b. 12/2015 Echo: EF 20-25%; c.              02/2016 Echo: EF 30-35%; d. 11/2016 Echo: EF 40-45%; e.               06/2019 Echo: EF 30-35%; f. 11/2019 Echo: EF 25-30%; g.               05/2020 Echo: EF 35-40%; h. 05/2021 Echo: EF 40-45%; i.               11/2021 Echo: EF 20-25%, glob HK, mild LVH,  GrIII DD, sev               red RV fxn, mild MR. No date: Hypertension No date: Mitral regurgitation     Comment:  Mild to moderate by October 2021 echocardiogram. No date: Mixed Ischemic & NICM (nonischemic cardiomyopathy) ([place])     Comment:  a. 03/2015 Echo: EF 45-50%; b. 12/2015 Echo: EF 20-25%; c.              02/2016 Echo: EF 30-35%; d. 11/2016 Echo: EF 40-45%; e.               06/2019 Echo: EF 30-35%; f. 11/2019 Echo: EF 25-30%; g.               05/2020 Echo: EF 35-40%; h. 05/2021 Echo: EF 40-45%; i.               11/2021 Echo: EF 20-25% No date: Myocardial infarct ([place]) No date: NSVT (nonsustained ventricular tachycardia) ([place])     Comment:  a. 12/2015 noted on tele-->amio;  b. 12/2015 Event  monitor: no VT noted. No date: Obesity (BMI 30.0-34.9) No date: Psoriasis 06/07/2020: Recurrent pulmonary emboli ([place])     Comment:  06/07/20: small bilateral PEs.  12/31/19: RUL and RLL               PEs. No date: Syncope     Comment:  a. 01/2016 - felt to be vasovagal.  Past Surgical History: No date: AMPUTATION No date: CARDIAC CATHETERIZATION 08/01/2022: COLONOSCOPY WITH PROPOFOL; N/A     Comment:  Procedure: COLONOSCOPY WITH PROPOFOL;  Surgeon: Lin Landsman, MD;  Location: [facility] ENDOSCOPY;  Service:               Gastroenterology;  Laterality: N/A; 08/01/2022: ESOPHAGOGASTRODUODENOSCOPY (EGD) WITH PROPOFOL; N/A     Comment:  Procedure: ESOPHAGOGASTRODUODENOSCOPY (EGD) WITH               PROPOFOL;  Surgeon: Lin Landsman, MD;  Location:               [facility] ENDOSCOPY;  Service: Gastroenterology;  Laterality:               N/A; No date: FINGER AMPUTATION     Comment:  Traumatic No date: FINGER FRACTURE SURGERY; Left No date: FRACTURE SURGERY 01/06/2017: LEFT HEART CATH AND CORONARY ANGIOGRAPHY; N/A     Comment:  Procedure: Left Heart Cath and Coronary Angiography;                Surgeon: Wellington Hampshire, MD;  Location: [place]                CV [facility];  Service: Cardiovascular;  Laterality: N/A; 06/13/2020: RIGHT/LEFT HEART CATH AND CORONARY ANGIOGRAPHY; N/A     Comment:  Procedure: RIGHT/LEFT HEART CATH AND CORONARY               ANGIOGRAPHY;  Surgeon: Nelva Bush, MD;  Location:               [place] [facility];  Service: Cardiovascular;                Laterality: N/A; 05/20/2022: SUBQ ICD IMPLANT; N/A     Comment:  Procedure: SUBQ ICD IMPLANT;  Surgeon: Vickie Epley, MD;  Location: [place] [facility];  Service:               Cardiovascular;  Laterality: N/A;  BMI    Body Mass Index: 34.87 kg/m      Reproductive/Obstetrics negative OB ROS                             Anesthesia Physical Anesthesia Plan  ASA: 3  Anesthesia Plan: General   Post-op Pain Management: Minimal or no pain anticipated   Induction: Intravenous  PONV Risk Score and Plan: 3 and Propofol infusion, TIVA and Ondansetron  Airway Management Planned: Nasal Cannula  Additional Equipment: None  Intra-op Plan:   Post-operative Plan:   Informed Consent: I have reviewed the patients History and Physical, chart, labs and discussed the procedure including the risks, benefits and alternatives for the proposed anesthesia with the patient or authorized representative who has indicated his/her understanding and acceptance.     Dental advisory given  Plan Discussed with: CRNA and Surgeon  Anesthesia Plan Comments: (Discussed  risks of anesthesia with patient, including possibility of difficulty with spontaneous ventilation under anesthesia necessitating airway intervention, PONV, and rare risks such as cardiac or respiratory or neurological events, and allergic reactions. Discussed the role of CRNA in patient's perioperative care. Patient understands.)       Anesthesia Quick Evaluation

## 2022-10-16 NOTE — H&P (Signed)
[name], MD [address]  [place]  [place], [place] [address]  Main: [phone]  Fax: [phone] Pager: [phone]  Primary Care Physician:  [name], FNP Primary Gastroenterologist:  Dr. [name]  Pre-Procedure History & Physical: HPI:  Gabriel [name]. is a 52 y.o. male is here for an endoscopy.   Past Medical History:  Diagnosis Date   CAD (coronary artery disease)    a. 04/2015 low risk MV;  b. 12/2016 Cath: minor irregs in LAD/Diag/LCX/OM, RCA 40p/m/d; c. 05/2020 Cath: LM nl, LAD 50d, LCX 30p, OM1 40, RCA 100p w/ L->R collats. CO/CI 3.1/1.3-->Med rx.   Chest wall pain, chronic    Chronic Troponin Elevation    CKD (chronic kidney disease), stage II-III    COPD (chronic obstructive pulmonary disease) ([facility])    Diabetes mellitus without complication ([place])    HFrEF (heart failure with reduced ejection fraction) ([place])    a. 03/2015 Echo: EF 45-50%; b. 12/2015 Echo: EF 20-25%; c. 02/2016 Echo: EF 30-35%; d. 11/2016 Echo: EF 40-45%; e. 06/2019 Echo: EF 30-35%; f. 11/2019 Echo: EF 25-30%; g. 05/2020 Echo: EF 35-40%; h. 05/2021 Echo: EF 40-45%; i. 11/2021 Echo: EF 20-25%, glob HK, mild LVH, GrIII DD, sev red RV fxn, mild MR.   Hypertension    Mitral regurgitation    Mild to moderate by October 2021 echocardiogram.   Mixed Ischemic & NICM (nonischemic cardiomyopathy) ([place])    a. 03/2015 Echo: EF 45-50%; b. 12/2015 Echo: EF 20-25%; c. 02/2016 Echo: EF 30-35%; d. 11/2016 Echo: EF 40-45%; e. 06/2019 Echo: EF 30-35%; f. 11/2019 Echo: EF 25-30%; g. 05/2020 Echo: EF 35-40%; h. 05/2021 Echo: EF 40-45%; i. 11/2021 Echo: EF 20-25%   Myocardial infarct ([facility])    NSVT (nonsustained ventricular tachycardia) ([place])    a. 12/2015 noted on tele-->amio;  b. 12/2015 Event monitor: no VT noted.   Obesity (BMI 30.0-34.9)    Psoriasis    Recurrent pulmonary emboli ([place]) 06/07/2020   06/07/20: small bilateral PEs.  12/31/19: RUL and RLL PEs.   Syncope    a. 01/2016 - felt to be vasovagal.     Past Surgical History:  Procedure Laterality Date   AMPUTATION     CARDIAC CATHETERIZATION     COLONOSCOPY WITH PROPOFOL N/A 08/01/2022   Procedure: COLONOSCOPY WITH PROPOFOL;  Surgeon: Lin Landsman, MD;  Location: [facility] ENDOSCOPY;  Service: Gastroenterology;  Laterality: N/A;   ESOPHAGOGASTRODUODENOSCOPY (EGD) WITH PROPOFOL N/A 08/01/2022   Procedure: ESOPHAGOGASTRODUODENOSCOPY (EGD) WITH PROPOFOL;  Surgeon: Lin Landsman, MD;  Location: [facility] ENDOSCOPY;  Service: Gastroenterology;  Laterality: N/A;   FINGER AMPUTATION     Traumatic   FINGER FRACTURE SURGERY Left    FRACTURE SURGERY     LEFT HEART CATH AND CORONARY ANGIOGRAPHY N/A 01/06/2017   Procedure: Left Heart Cath and Coronary Angiography;  Surgeon: Wellington Hampshire, MD;  Location: [place] CV LAB;  Service: Cardiovascular;  Laterality: N/A;   RIGHT/LEFT HEART CATH AND CORONARY ANGIOGRAPHY N/A 06/13/2020   Procedure: RIGHT/LEFT HEART CATH AND CORONARY ANGIOGRAPHY;  Surgeon: Nelva Bush, MD;  Location: [place] [facility] LAB;  Service: Cardiovascular;  Laterality: N/A;   SUBQ ICD IMPLANT N/A 05/20/2022   Procedure: SUBQ ICD IMPLANT;  Surgeon: Vickie Epley, MD;  Location: [place] [facility];  Service: Cardiovascular;  Laterality: N/A;    Prior to Admission medications   Medication Sig Start Date End Date Taking? Authorizing Provider  blood glucose meter kit and supplies KIT  Dispense based on patient and insurance preference. Use up to four times daily as directed. 07/08/22  Yes [name], FNP  sacubitril-valsartan (ENTRESTO) 97-103 MG Take 1 tablet by mouth 2 (two) times daily. 08/27/22  Yes Bensimhon, Shaune Pascal, MD  torsemide (DEMADEX) 20 MG tablet Take 2 tablets (40 mg total) by mouth daily. Take an extra 20 mg tablet daily as needed for edema 08/27/22  Yes Bensimhon, Shaune Pascal, MD  allopurinol (ZYLOPRIM) 100 MG tablet Take 1 tablet (100 mg total) by mouth daily. 09/17/22   [name], FNP   carvedilol (COREG) 3.125 MG tablet Take 1 tablet (3.125 mg total) by mouth 2 (two) times daily with a meal. 02/14/22   Theora Gianotti, NP  clobetasol (TEMOVATE) 0.05 % external solution Apply 1 Application topically 2 (two) times daily. 05/31/22   [name], FNP  colchicine 0.6 MG tablet Day 1: Take 2 tablets PO for gout flare, repeat 1 tablet one hour following initial dose. Day 2: Take 1 tablet twice daily PO for an additional 2-3 days. 09/17/22   [name], FNP  Continuous Blood Gluc Sensor (FREESTYLE LIBRE 3 SENSOR) MISC Place 1 sensor on the skin every 14 days. Use to check glucose continuously 05/30/22   Virginia Crews, MD  dapagliflozin propanediol (FARXIGA) 10 MG TABS tablet Take 1 tablet (10 mg total) by mouth daily. 05/09/22   Alisa Graff, FNP  fenofibrate (TRICOR) 145 MG tablet Take 1 tablet (145 mg total) by mouth daily. 01/15/22   Alisa Graff, FNP  FLUoxetine (PROZAC) 20 MG capsule Take 1 capsule (20 mg total) by mouth daily. 02/08/22   [name], FNP  Furosemide (FUROSCIX) 80 MG/10ML CTKT Inject 1 Dose into the skin as needed (edema). 07/22/22   Bensimhon, Shaune Pascal, MD  icosapent Ethyl (VASCEPA) 1 g capsule Take 2 capsules (2 g total) by mouth 2 (two) times daily. 09/16/22   [name], FNP  insulin aspart (NOVOLOG) 100 UNIT/ML FlexPen Inject 6 units + correction before breakfast/lunch and 9 units + correction before supper (correction = 1 unit per 50 mg/dL above 150); max daily dose 36 units 07/09/22   [name], FNP  Insulin Glargine (BASAGLAR KWIKPEN) 100 UNIT/ML Inject 36 Units into the skin at bedtime. 01/19/22 02/09/23  Swayze, Ava, DO  Insulin Pen Needle (PEN NEEDLES) 32G X 4 MM MISC 200 each by Does not apply route at bedtime. 05/09/22   [name], FNP  Ipratropium-Albuterol (COMBIVENT RESPIMAT) 20-100 MCG/ACT AERS respimat Inhale 1 puff into the lungs every 6 (six) hours as needed for wheezing.    [provider]  meclizine  (ANTIVERT) 25 MG tablet Take 1 tablet (25 mg total) by mouth 3 (three) times daily as needed for dizziness. 05/15/22   Patrecia Pour, MD  nitroGLYCERIN (NITROSTAT) 0.4 MG SL tablet Place 1 tablet (0.4 mg total) under the tongue every 5 (five) minutes x 3 doses as needed for chest pain. 09/17/22   [name], FNP  omeprazole (PRILOSEC) 40 MG capsule Take 1 capsule (40 mg total) by mouth 2 (two) times daily before a meal for 30 days, THEN 1 capsule (40 mg total) daily before breakfast. 08/01/22 11/29/22  Tewana Bohlen, Tally Due, MD  potassium chloride SA (KLOR-CON M) 20 MEQ tablet Take 1 tablet (20 mEq total) by mouth every other day. 08/27/22   Bensimhon, Shaune Pascal, MD  rivaroxaban (XARELTO) 20 MG TABS tablet Take 1 tablet (20 mg  total) by mouth daily with supper. 05/26/22   Shirley Friar, PA-C  rosuvastatin (CRESTOR) 40 MG tablet Take 1 tablet (40 mg total) by mouth daily. 04/01/22   Minna Merritts, MD  Semaglutide, 2 MG/DOSE, 8 MG/3ML SOPN Inject 2 mg as directed once a week. 09/16/22   [name], FNP  spironolactone (ALDACTONE) 25 MG tablet Take 1 tablet (25 mg total) by mouth daily. 04/25/22   Alisa Graff, FNP  insulin lispro (HUMALOG) 100 UNIT/ML KwikPen Inject 3 units before breakfast and lunch. 5 units before dinner. 01/19/22 01/21/22  Swayze, Ava, DO    Allergies as of 09/30/2022 - Review Complete 09/30/2022  Allergen Reaction Noted   Metformin and related Nausea And Vomiting 11/22/2019   Zantac [ranitidine hcl] Diarrhea and Nausea Only 08/27/2018    Family History  Problem Relation Age of Onset   Diabetes Mother    Diabetes Mellitus II Mother    Hypothyroidism Mother    Hypertension Mother    Kidney failure Mother        Dialysis   Heart attack Mother        2 yo approximately   Hypertension Father    Gout Father    Cancer Maternal Grandfather    Diabetes Maternal Grandfather    Cancer Paternal Aunt     Social History   Socioeconomic History   Marital status:  Widowed    Spouse name: Not on file   Number of children: 1   Years of education: 90   Highest education level: 12th grade  Occupational History   Occupation: disabled  Tobacco Use   Smoking status: Former    Packs/day: 0.50    Years: 33.00    Total pack years: 16.50    Types: Cigarettes    Quit date: 05/08/2020    Years since quitting: 2.4   Smokeless tobacco: Former    Quit date: 05/08/2020   Tobacco comments:    Quit Sept 2021  Vaping Use   Vaping Use: Former  Substance and Sexual Activity   Alcohol use: Not Currently    Comment: occassionally   Drug use: No   Sexual activity: Not Currently  Other Topics Concern   Not on file  Social History Narrative   Lives at home with wife and his dad's wife.        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    Social Determinants of Health   Financial Resource Strain: Medium Risk (08/05/2022)   Overall Financial Resource Strain (CARDIA)    Difficulty of Paying Living Expenses: Somewhat hard  Food Insecurity: No Food Insecurity (08/15/2022)   Hunger Vital Sign    Worried About Running Out of Food in the Last Year: Never true    Ran Out of Food in the Last Year: Never true  Transportation Needs: Unmet Transportation Needs (08/15/2022)   PRAPARE - Transportation    Lack of Transportation (Medical): Yes    Lack of Transportation (Non-Medical): Yes  Physical Activity: Insufficiently Active (01/20/2018)   Exercise Vital Sign    Days of Exercise per Week: 7 days    Minutes of Exercise per Session: 10 min  Stress: Stress Concern Present (09/02/2017)   [place]    Feeling of Stress : Very much  Social Connections: Moderately Isolated (09/02/2017)   Social Connection and Isolation Panel [NHANES]    Frequency of Communication with  Friends and Family: Twice a week    Frequency of Social Gatherings with Friends  and Family: Once a week    Attends Religious Services: Never    Marine scientist or Organizations: No    Attends Archivist Meetings: Never    Marital Status: Widowed  Intimate Partner Violence: Not At Risk (05/14/2022)   Humiliation, Afraid, Rape, and Kick questionnaire    Fear of Current or Ex-Partner: No    Emotionally Abused: No    Physically Abused: No    Sexually Abused: No    Review of Systems: See HPI, otherwise negative ROS  Physical Exam: BP (!) 155/105   Pulse 95   Temp 98 F (36.7 C) (Temporal)   Resp (!) 21   Ht '5\' 11"'$  (1.803 m)   Wt 113.4 kg   SpO2 97%   BMI 34.87 kg/m  General:   Alert,  pleasant and cooperative in NAD Head:  Normocephalic and atraumatic. Neck:  Supple; no masses or thyromegaly. Lungs:  Clear throughout to auscultation.    Heart:  Regular rate and rhythm. Abdomen:  Soft, nontender and nondistended. Normal bowel sounds, without guarding, and without rebound.   Neurologic:  Alert and  oriented x4;  grossly normal neurologically.  Impression/Plan: Gabriel [name]. is here for an endoscopy to be performed for Chronic upper abdominal pain, burning in nature associated with nausea, frequent burping, change in bowel habits   Risks, benefits, limitations, and alternatives regarding  endoscopy have been reviewed with the patient.  Questions have been answered.  All parties agreeable.   Sherri Sear, MD  10/16/2022, 8:30 AM

## 2022-10-17 ENCOUNTER — Encounter: Payer: Self-pay | Admitting: Gastroenterology

## 2022-10-17 ENCOUNTER — Ambulatory Visit: Payer: Medicaid Other | Attending: Student in an Organized Health Care Education/Training Program

## 2022-10-17 DIAGNOSIS — J449 Chronic obstructive pulmonary disease, unspecified: Secondary | ICD-10-CM | POA: Diagnosis not present

## 2022-10-17 DIAGNOSIS — G4733 Obstructive sleep apnea (adult) (pediatric): Secondary | ICD-10-CM

## 2022-10-17 DIAGNOSIS — R0602 Shortness of breath: Secondary | ICD-10-CM

## 2022-10-17 LAB — PULMONARY FUNCTION TEST ARMC ONLY
DL/VA % pred: 69 %
DL/VA: 3.03 ml/min/mmHg/L
DLCO unc % pred: 70 %
DLCO unc: 20.99 ml/min/mmHg
FEF 25-75 Post: 3.95 L/sec
FEF 25-75 Pre: 3.84 L/sec
FEF2575-%Change-Post: 2 %
FEF2575-%Pred-Post: 115 %
FEF2575-%Pred-Pre: 112 %
FEV1-%Change-Post: 6 %
FEV1-%Pred-Post: 96 %
FEV1-%Pred-Pre: 91 %
FEV1-Post: 3.83 L
FEV1-Pre: 3.61 L
FEV1FVC-%Change-Post: 3 %
FEV1FVC-%Pred-Pre: 106 %
FEV6-%Change-Post: 2 %
FEV6-%Pred-Post: 90 %
FEV6-%Pred-Pre: 88 %
FEV6-Post: 4.5 L
FEV6-Pre: 4.38 L
FEV6FVC-%Pred-Post: 103 %
FEV6FVC-%Pred-Pre: 103 %
FVC-%Change-Post: 2 %
FVC-%Pred-Post: 87 %
FVC-%Pred-Pre: 85 %
FVC-Post: 4.5 L
FVC-Pre: 4.38 L
Post FEV1/FVC ratio: 85 %
Post FEV6/FVC ratio: 100 %
Pre FEV1/FVC ratio: 83 %
Pre FEV6/FVC Ratio: 100 %
RV % pred: 87 %
RV: 1.88 L
TLC % pred: 91 %
TLC: 6.58 L

## 2022-10-17 LAB — SURGICAL PATHOLOGY

## 2022-10-17 MED ORDER — ALBUTEROL SULFATE (2.5 MG/3ML) 0.083% IN NEBU
2.5000 mg | INHALATION_SOLUTION | Freq: Once | RESPIRATORY_TRACT | Status: AC
  Administered 2022-10-17: 2.5 mg via RESPIRATORY_TRACT
  Filled 2022-10-17: qty 3

## 2022-10-21 ENCOUNTER — Other Ambulatory Visit: Payer: Self-pay | Admitting: Nurse Practitioner

## 2022-10-21 ENCOUNTER — Encounter: Payer: Self-pay | Admitting: Student in an Organized Health Care Education/Training Program

## 2022-10-21 ENCOUNTER — Telehealth: Payer: Self-pay | Admitting: Family

## 2022-10-21 ENCOUNTER — Other Ambulatory Visit
Admission: RE | Admit: 2022-10-21 | Discharge: 2022-10-21 | Disposition: A | Discharge status: Home / Self Care | Payer: Medicaid Other | Attending: Student in an Organized Health Care Education/Training Program | Admitting: Student in an Organized Health Care Education/Training Program

## 2022-10-21 ENCOUNTER — Ambulatory Visit (INDEPENDENT_AMBULATORY_CARE_PROVIDER_SITE_OTHER): Payer: Medicaid Other | Admitting: Student in an Organized Health Care Education/Training Program

## 2022-10-21 VITALS — BP 122/80 | HR 83 | Temp 97.7°F | Ht 71.0 in | Wt 245.8 lb

## 2022-10-21 DIAGNOSIS — R0602 Shortness of breath: Secondary | ICD-10-CM

## 2022-10-21 DIAGNOSIS — I272 Pulmonary hypertension, unspecified: Secondary | ICD-10-CM

## 2022-10-21 DIAGNOSIS — G4733 Obstructive sleep apnea (adult) (pediatric): Secondary | ICD-10-CM | POA: Diagnosis not present

## 2022-10-21 DIAGNOSIS — I2699 Other pulmonary embolism without acute cor pulmonale: Secondary | ICD-10-CM | POA: Diagnosis not present

## 2022-10-21 DIAGNOSIS — I5022 Chronic systolic (congestive) heart failure: Secondary | ICD-10-CM

## 2022-10-21 LAB — BASIC METABOLIC PANEL
Anion gap: 12 (ref 5–15)
BUN: 46 mg/dL — ABNORMAL HIGH (ref 6–20)
CO2: 30 mmol/L (ref 22–32)
Calcium: 10 mg/dL (ref 8.9–10.3)
Chloride: 98 mmol/L (ref 98–111)
Creatinine, Ser: 1.79 mg/dL — ABNORMAL HIGH (ref 0.61–1.24)
GFR, Estimated: 45 mL/min — ABNORMAL LOW (ref >60)
Glucose, Bld: 160 mg/dL — ABNORMAL HIGH (ref 70–99)
Potassium: 4.2 mmol/L (ref 3.5–5.1)
Sodium: 140 mmol/L (ref 135–145)

## 2022-10-21 LAB — BRAIN NATRIURETIC PEPTIDE: B Natriuretic Peptide: 39.9 pg/mL (ref 0.0–100.0)

## 2022-10-21 MED ORDER — ALBUTEROL SULFATE HFA 108 (90 BASE) MCG/ACT IN AERS: 2.0000 | INHALATION_SPRAY | RESPIRATORY_TRACT | 6 refills | Status: DC | PRN

## 2022-10-21 NOTE — Progress Notes (Signed)
Assessment & Plan:   1. Shortness of breath  Presents for follow up on shortness of breath that is multi-factorial. He has a significant cardiac history (multi-vessel CAD, HFrEF, last EF 30%), PE (Diagnosed 12/2019 on Rivaroxaban) and emphysema on imaging (history of smoking).    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 He does have a history of psoriasis (? 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  PFT's show a response to bronchodilators but overall spirometry is normal, as are lung volumes. DLCO is decreased at 70% predicted. He reports some improvement with albuterol during his PFT's and I will provide him a prescription for ventolin; should there be notable symptomatic improvement I will consider prescribing a long acting inhaler. Given history of VTE, I will order a perfusion study to assess for risk of CTEPH. Finally, will obtain a BMP and BNP today given that he is on torsemide and has recently increased his dose.   - albuterol (VENTOLIN HFA) 108 (90 Base) MCG/ACT inhaler; Inhale 2 puffs into the lungs every 4 (four) hours as needed for wheezing or shortness of breath.  Dispense: 8 g; Refill: 6 - [facility] Pulmonary Perfusion; Future - B Nat Peptide; Future - Basic metabolic panel; Future - DG Chest 2 View; Future  2. OSA on CPAP  Severe sleep apnea on split night sleep study. Complex with central sleep apnea. Given heart failure, will shy away from ASV or providing any backup rate. Sleep study recommendation to initiation BiPAP auto-IPAP 6-20 and, EPAP of 12. Will refer to sleep medicine specialist for consultation regarding management of his complex sleep apnea.  - AMB REFERRAL FOR DME  3. Pulmonary embolism, unspecified chronicity, unspecified pulmonary embolism type, unspecified  whether acute cor pulmonale present ([place]) 4. Pulmonary hypertension, low resistance (HCC)  History of pulmonary embolism, and given pre-capillary pulmonary hypertension, will obtain perfusion study to assess for possibility of CTEPH  - [facility] Pulmonary Perfusion; Future - DG Chest 2 View; Future   Return in about 4 months (around 02/20/2023).  I spent 30 minutes caring for this patient today, including preparing to see the patient, obtaining a medical history , reviewing a separately obtained history, performing a medically appropriate examination and/or evaluation, counseling and educating the patient/family/caregiver, ordering medications, tests, or procedures, and documenting clinical information in the electronic health record  Gabriel Reichert, MD [facility] 10/21/2022 8:56 AM    End of visit medications:  Meds ordered this encounter  Medications   albuterol (VENTOLIN HFA) 108 (90 Base) MCG/ACT inhaler    Sig: Inhale 2 puffs into the lungs every 4 (four) hours as needed for wheezing or shortness of breath.    Dispense:  8 g    Refill:  6     Current Outpatient Medications:    albuterol (VENTOLIN HFA) 108 (90 Base) MCG/ACT inhaler, Inhale 2 puffs into the lungs every 4 (four) hours as needed for wheezing or shortness of breath., Disp: 8 g, Rfl: 6   allopurinol (ZYLOPRIM) 100 MG tablet, Take 1 tablet (100 mg total) by mouth daily., Disp: 30 tablet, Rfl: 6   blood glucose meter kit and supplies KIT, Dispense based on patient and insurance preference. Use up to four times daily as directed., Disp: 1 each, Rfl: 11   carvedilol (COREG) 3.125 MG  tablet, Take 1 tablet (3.125 mg total) by mouth 2 (two) times daily with a meal., Disp: 60 tablet, Rfl: 3   clobetasol (TEMOVATE) 0.05 % external solution, Apply 1 Application topically 2 (two) times daily., Disp: 50 mL, Rfl: 1   colchicine 0.6 MG tablet, Day 1: Take 2 tablets PO for gout flare, repeat 1 tablet one hour following  initial dose. Day 2: Take 1 tablet twice daily PO for an additional 2-3 days., Disp: 60 tablet, Rfl: 0   Continuous Blood Gluc Sensor (FREESTYLE LIBRE 3 SENSOR) MISC, Place 1 sensor on the skin every 14 days. Use to check glucose continuously, Disp: 6 each, Rfl: 3   dapagliflozin propanediol (FARXIGA) 10 MG TABS tablet, Take 1 tablet (10 mg total) by mouth daily., Disp: 30 tablet, Rfl: 5   fenofibrate (TRICOR) 145 MG tablet, Take 1 tablet (145 mg total) by mouth daily., Disp: 90 tablet, Rfl: 3   FLUoxetine (PROZAC) 20 MG capsule, Take 1 capsule (20 mg total) by mouth daily., Disp: 90 capsule, Rfl: 3   Furosemide (FUROSCIX) 80 MG/10ML CTKT, Inject 1 Dose into the skin as needed (edema)., Disp: 6 each, Rfl: 0   icosapent Ethyl (VASCEPA) 1 g capsule, Take 2 capsules (2 g total) by mouth 2 (two) times daily., Disp: 360 capsule, Rfl: 1   insulin aspart (NOVOLOG) 100 UNIT/ML FlexPen, Inject 6 units + correction before breakfast/lunch and 9 units + correction before supper (correction = 1 unit per 50 mg/dL above 150); max daily dose 36 units, Disp: 15 mL, Rfl: 3   Insulin Glargine (BASAGLAR KWIKPEN) 100 UNIT/ML, Inject 36 Units into the skin at bedtime., Disp: 30 mL, Rfl: 3   Insulin Pen Needle (PEN NEEDLES) 32G X 4 MM MISC, 200 each by Does not apply route at bedtime., Disp: 200 each, Rfl: 3   meclizine (ANTIVERT) 25 MG tablet, Take 1 tablet (25 mg total) by mouth 3 (three) times daily as needed for dizziness., Disp: 15 tablet, Rfl: 0   nitroGLYCERIN (NITROSTAT) 0.4 MG SL tablet, Place 1 tablet (0.4 mg total) under the tongue every 5 (five) minutes x 3 doses as needed for chest pain., Disp: 30 tablet, Rfl: 0   omeprazole (PRILOSEC) 40 MG capsule, Take 1 capsule (40 mg total) by mouth daily., Disp: 30 capsule, Rfl: 4   potassium chloride SA (KLOR-CON M) 20 MEQ tablet, Take 1 tablet (20 mEq total) by mouth every other day., Disp: 15 tablet, Rfl: 6   rivaroxaban (XARELTO) 20 MG TABS tablet, Take 1 tablet (20 mg  total) by mouth daily with supper., Disp: 90 tablet, Rfl: 3   rosuvastatin (CRESTOR) 40 MG tablet, Take 1 tablet (40 mg total) by mouth daily., Disp: 90 tablet, Rfl: 3   sacubitril-valsartan (ENTRESTO) 97-103 MG, Take 1 tablet by mouth 2 (two) times daily., Disp: 60 tablet, Rfl: 6   Semaglutide, 2 MG/DOSE, 8 MG/3ML SOPN, Inject 2 mg as directed once a week., Disp: 9 mL, Rfl: 1   spironolactone (ALDACTONE) 25 MG tablet, Take 1 tablet (25 mg total) by mouth daily., Disp: 90 tablet, Rfl: 3   torsemide (DEMADEX) 20 MG tablet, Take 2 tablets (40 mg total) by mouth daily. Take an extra 20 mg tablet daily as needed for edema, Disp: 75 tablet, Rfl: 6   Subjective:   PATIENT ID: Gabriel [name]. GENDER: male DOB: August 27, 1970, MRN: [id number]  Chief Complaint  Patient presents with   Follow-up    SOB with exertion, occ dry cough and wheezing.  HPI  Mr. Martire is a pleasant 52 year old male patient with a past medical history most notable for CAD, heart failure, diabetes, hypertension, PE on Xarelto, and OSA who is presenting to clinic for follow up.   Patient reports that he has been short of breath with exertion since 2016 when he had his heart attack.  Over the past 7 years he has been experiencing exertional dyspnea with minimal activity. He reports an occasional cough that is dry and nonproductive.  He sometimes feels chest tightness but denies any wheezing.  He has had multiple hospitalizations for decompensations of his heart failure. He has been closely followed by cardiology and is on an aggressive regimen for the management of heart failure as well as on Rivaroxaban for pulmonary embolism. He has been seen by our heart failure team as well.  Patient does not endorse any wheezing or chest pain/tightness. His lower extremity edema is stable with his current dose of diuretics. He had a RHC in October of 2021 while hospitalized for heart failure showing RA 9, RV 57/10, PA 57/28 (40), PCWP 17, CO  3.1 and CI of 1.3. PVR calculated at 7.4 [place].   He was previously diagnosed with OSA with a sleep study in 2021 - at that time he had an AHI of 100 requiring a split night sleep study with titration. At that time, he required BiPAP pressures of 26/22 to improve his AHI. He tells me he was given a sleep machine that was taken away from him secondary to insurance issues that he is unsure of. He has underwent multiple CT scans of the chest with the report noting emphysema.  Patient is not on any inhalers but is on multiple medications for the management of his cardiac condition.  Today, he presents for follow up. He had his split night sleep study in December, and has been seen by his cardiology providers for follow up. He reports that his dyspnea is unchanged. He has not had any new symptoms. He tried increasing his torsemide but he feels unchanged.   He does have a history of smoking and reports having smoked at least 2 packs a day for over 40 years. 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  Ancillary information including prior medications, full medical/surgical/family/social histories, and PFTs (when available) are listed below and have been reviewed.   Review of Systems  Constitutional:  Negative for chills, fever and weight loss.  Respiratory:  Positive for shortness of breath. Negative for cough, hemoptysis, sputum production and wheezing.   Cardiovascular:  Negative for chest pain.  Skin:  Negative for rash.     Objective:   Vitals:   10/21/22 0831  BP: 122/80  Pulse: 83  Temp: 97.7 F (36.5 C)  TempSrc: Temporal  SpO2: 98%  Weight: 245 lb 12.8 oz (111.5 kg)  Height: '5\' 11"'$  (1.803 m)   98% on RA  BMI Readings from Last 3 Encounters:  10/21/22 34.28 kg/m  10/16/22 34.87 kg/m  09/30/22 33.41 kg/m   Wt Readings from Last 3 Encounters:  10/21/22 245 lb 12.8 oz (111.5 kg)  10/16/22 250 lb (113.4 kg)   09/30/22 246 lb 6 oz (111.8 kg)    Physical Exam Constitutional:      Appearance: He is obese. He is not ill-appearing.  HENT:     Nose: Nose normal.     Mouth/Throat:     Mouth: Mucous membranes are moist.  Neck:  Comments: Thick neck, facial hair Cardiovascular:     Rate and Rhythm: Normal rate and regular rhythm.     Pulses: Normal pulses.     Heart sounds: Normal heart sounds.  Pulmonary:     Effort: Pulmonary effort is normal.     Breath sounds: Rales (faint bibasilar) present. No wheezing.  Abdominal:     General: There is distension.     Palpations: Abdomen is soft.  Neurological:     General: No focal deficit present.     Mental Status: He is alert and oriented to person, place, and time. Mental status is at baseline.     Ancillary Information    Past Medical History:  Diagnosis Date   CAD (coronary artery disease)    a. 04/2015 low risk MV;  b. 12/2016 Cath: minor irregs in LAD/Diag/LCX/OM, RCA 40p/m/d; c. 05/2020 Cath: LM nl, LAD 50d, LCX 30p, OM1 40, RCA 100p w/ L->R collats. CO/CI 3.1/1.3-->Med rx.   Chest wall pain, chronic    Chronic Troponin Elevation    CKD (chronic kidney disease), stage II-III    COPD (chronic obstructive pulmonary disease) (HCC)    Diabetes mellitus without complication ([place])    HFrEF (heart failure with reduced ejection fraction) ([place])    a. 03/2015 Echo: EF 45-50%; b. 12/2015 Echo: EF 20-25%; c. 02/2016 Echo: EF 30-35%; d. 11/2016 Echo: EF 40-45%; e. 06/2019 Echo: EF 30-35%; f. 11/2019 Echo: EF 25-30%; g. 05/2020 Echo: EF 35-40%; h. 05/2021 Echo: EF 40-45%; i. 11/2021 Echo: EF 20-25%, glob HK, mild LVH, GrIII DD, sev red RV fxn, mild MR.   Hypertension    Mitral regurgitation    Mild to moderate by October 2021 echocardiogram.   Mixed Ischemic & NICM (nonischemic cardiomyopathy) ([place])    a. 03/2015 Echo: EF 45-50%; b. 12/2015 Echo: EF 20-25%; c. 02/2016 Echo: EF 30-35%; d. 11/2016 Echo: EF 40-45%; e. 06/2019 Echo: EF 30-35%; f. 11/2019 Echo:  EF 25-30%; g. 05/2020 Echo: EF 35-40%; h. 05/2021 Echo: EF 40-45%; i. 11/2021 Echo: EF 20-25%   Myocardial infarct (HCC)    NSVT (nonsustained ventricular tachycardia) ([place])    a. 12/2015 noted on tele-->amio;  b. 12/2015 Event monitor: no VT noted.   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  12/31/19: RUL and RLL PEs.   Syncope    a. 01/2016 - felt to be vasovagal.     Family History  Problem Relation Age of Onset   Diabetes Mother    Diabetes Mellitus II Mother    Hypothyroidism Mother    Hypertension Mother    Kidney failure Mother        Dialysis   Heart attack Mother        21 yo approximately   Hypertension Father    Gout Father    Cancer Maternal Grandfather    Diabetes Maternal Grandfather    Cancer Paternal Aunt      Past Surgical History:  Procedure Laterality Date   AMPUTATION     CARDIAC CATHETERIZATION     COLONOSCOPY WITH PROPOFOL N/A 08/01/2022   Procedure: COLONOSCOPY WITH PROPOFOL;  Surgeon: Lin Landsman, MD;  Location: [facility];  Service: Gastroenterology;  Laterality: N/A;   ESOPHAGOGASTRODUODENOSCOPY (EGD) WITH PROPOFOL N/A 08/01/2022   Procedure: ESOPHAGOGASTRODUODENOSCOPY (EGD) WITH PROPOFOL;  Surgeon: Lin Landsman, MD;  Location: [facility] ENDOSCOPY;  Service: Gastroenterology;  Laterality: N/A;   ESOPHAGOGASTRODUODENOSCOPY (EGD) WITH PROPOFOL N/A  10/16/2022   Procedure: ESOPHAGOGASTRODUODENOSCOPY (EGD) WITH PROPOFOL;  Surgeon: Lin Landsman, MD;  Location: [facility] ENDOSCOPY;  Service: Gastroenterology;  Laterality: N/A;   FINGER AMPUTATION     Traumatic   FINGER FRACTURE SURGERY Left    FRACTURE SURGERY     LEFT HEART CATH AND CORONARY ANGIOGRAPHY N/A 01/06/2017   Procedure: Left Heart Cath and Coronary Angiography;  Surgeon: Wellington Hampshire, MD;  Location: [facility];  Service: Cardiovascular;  Laterality: N/A;   RIGHT/LEFT HEART CATH AND CORONARY  ANGIOGRAPHY N/A 06/13/2020   Procedure: RIGHT/LEFT HEART CATH AND CORONARY ANGIOGRAPHY;  Surgeon: Nelva Bush, MD;  Location: [facility];  Service: Cardiovascular;  Laterality: N/A;   SUBQ ICD IMPLANT N/A 05/20/2022   Procedure: SUBQ ICD IMPLANT;  Surgeon: Vickie Epley, MD;  Location: [facility];  Service: Cardiovascular;  Laterality: N/A;    Social History   Socioeconomic History   Marital status: Widowed    Spouse name: Not on file   Number of children: 1   Years of education: 18   Highest education level: 12th grade  Occupational History   Occupation: disabled  Tobacco Use   Smoking status: Former    Packs/day: 0.50    Years: 33.00    Total pack years: 16.50    Types: Cigarettes    Quit date: 05/08/2020    Years since quitting: 2.4   Smokeless tobacco: Former    Quit date: 05/08/2020   Tobacco comments:    Quit Sept 2021  Vaping Use   Vaping Use: Former  Substance and Sexual Activity   Alcohol use: Not Currently    Comment: occassionally   Drug use: No   Sexual activity: Not Currently  Other Topics Concern   Not on file  Social History Narrative   Lives at home with wife and his dad's wife.        Works sales/advanced Academic librarian; smoker; cutting; hx of alcoholism [started 65 year]; quit at age of 47 years.    Social Determinants of Health   Financial Resource Strain: Medium Risk (08/05/2022)   Overall Financial Resource Strain (CARDIA)    Difficulty of Paying Living Expenses: Somewhat hard  Food Insecurity: No Food Insecurity (08/15/2022)   Hunger Vital Sign    Worried About Running Out of Food in the Last Year: Never true    Ran Out of Food in the Last Year: Never true  Transportation Needs: Unmet Transportation Needs (08/15/2022)   PRAPARE - Transportation    Lack of Transportation (Medical): Yes    Lack of Transportation (Non-Medical): Yes  Physical Activity: Insufficiently Active (01/20/2018)   Exercise Vital Sign    Days of Exercise  per Week: 7 days    Minutes of Exercise per Session: 10 min  Stress: Stress Concern Present (09/02/2017)   [place]    Feeling of Stress : Very much  Social Connections: Moderately Isolated (09/02/2017)   Social Connection and Isolation Panel [NHANES]    Frequency of Communication with Friends and Family: Twice a week    Frequency of Social Gatherings with Friends and Family: Once a week    Attends Religious Services: Never    Marine scientist or Organizations: No    Attends Archivist Meetings: Never    Marital Status: Widowed  Intimate Partner Violence: Not At Risk (05/14/2022)   Humiliation, Afraid, Rape, and Kick questionnaire    Fear of Current or  Ex-Partner: No    Emotionally Abused: No    Physically Abused: No    Sexually Abused: No     Allergies  Allergen Reactions   Metformin And Related Nausea And Vomiting   Zantac [Ranitidine Hcl] Diarrhea and Nausea Only    Night sweats     CBC    Component Value Date/Time   WBC 8.1 07/25/2022 1207   RBC 6.15 (H) 07/25/2022 1207   HGB 17.2 (H) 07/25/2022 1207   HGB 17.0 11/05/2021 1403   HCT 52.6 (H) 07/25/2022 1207   HCT 51.9 (H) 11/05/2021 1403   PLT 209 07/25/2022 1207   PLT 170 11/05/2021 1403   MCV 85.5 07/25/2022 1207   MCV 86 11/05/2021 1403   MCV 85 10/07/2013 1703   MCH 28.0 07/25/2022 1207   MCHC 32.7 07/25/2022 1207   RDW 13.7 07/25/2022 1207   RDW 14.3 11/05/2021 1403   RDW 14.6 (H) 10/07/2013 1703   LYMPHSABS 2.6 05/13/2022 1642   LYMPHSABS 1.8 11/05/2021 1403   LYMPHSABS 2.3 08/26/2012 1200   MONOABS 0.7 05/13/2022 1642   MONOABS 0.9 08/26/2012 1200   EOSABS 0.1 05/13/2022 1642   EOSABS 0.1 11/05/2021 1403   EOSABS 0.1 08/26/2012 1200   BASOSABS 0.1 05/13/2022 1642   BASOSABS 0.1 11/05/2021 1403   BASOSABS 0.1 08/26/2012 1200    Pulmonary Functions Testing Results:    Latest Ref Rng & Units 10/17/2022    8:14 AM   PFT Results  FVC-Pre L 4.38   FVC-Predicted Pre % 85   FVC-Post L 4.50   FVC-Predicted Post % 87   Pre FEV1/FVC % % 83   Post FEV1/FCV % % 85   FEV1-Pre L 3.61   FEV1-Predicted Pre % 91   FEV1-Post L 3.83   DLCO uncorrected ml/min/mmHg 20.99   DLCO UNC% % 70   DLVA Predicted % 69   TLC L 6.58   TLC % Predicted % 91   RV % Predicted % 87     Outpatient Medications Prior to Visit  Medication Sig Dispense Refill   allopurinol (ZYLOPRIM) 100 MG tablet Take 1 tablet (100 mg total) by mouth daily. 30 tablet 6   blood glucose meter kit and supplies KIT Dispense based on patient and insurance preference. Use up to four times daily as directed. 1 each 11   carvedilol (COREG) 3.125 MG tablet Take 1 tablet (3.125 mg total) by mouth 2 (two) times daily with a meal. 60 tablet 3   clobetasol (TEMOVATE) 0.05 % external solution Apply 1 Application topically 2 (two) times daily. 50 mL 1   colchicine 0.6 MG tablet Day 1: Take 2 tablets PO for gout flare, repeat 1 tablet one hour following initial dose. Day 2: Take 1 tablet twice daily PO for an additional 2-3 days. 60 tablet 0   Continuous Blood Gluc Sensor (FREESTYLE LIBRE 3 SENSOR) MISC Place 1 sensor on the skin every 14 days. Use to check glucose continuously 6 each 3   dapagliflozin propanediol (FARXIGA) 10 MG TABS tablet Take 1 tablet (10 mg total) by mouth daily. 30 tablet 5   fenofibrate (TRICOR) 145 MG tablet Take 1 tablet (145 mg total) by mouth daily. 90 tablet 3   FLUoxetine (PROZAC) 20 MG capsule Take 1 capsule (20 mg total) by mouth daily. 90 capsule 3   Furosemide (FUROSCIX) 80 MG/10ML CTKT Inject 1 Dose into the skin as needed (edema). 6 each 0   icosapent Ethyl (VASCEPA) 1 g capsule Take 2  capsules (2 g total) by mouth 2 (two) times daily. 360 capsule 1   insulin aspart (NOVOLOG) 100 UNIT/ML FlexPen Inject 6 units + correction before breakfast/lunch and 9 units + correction before supper (correction = 1 unit per 50 mg/dL above 150);  max daily dose 36 units 15 mL 3   Insulin Glargine (BASAGLAR KWIKPEN) 100 UNIT/ML Inject 36 Units into the skin at bedtime. 30 mL 3   Insulin Pen Needle (PEN NEEDLES) 32G X 4 MM MISC 200 each by Does not apply route at bedtime. 200 each 3   meclizine (ANTIVERT) 25 MG tablet Take 1 tablet (25 mg total) by mouth 3 (three) times daily as needed for dizziness. 15 tablet 0   nitroGLYCERIN (NITROSTAT) 0.4 MG SL tablet Place 1 tablet (0.4 mg total) under the tongue every 5 (five) minutes x 3 doses as needed for chest pain. 30 tablet 0   omeprazole (PRILOSEC) 40 MG capsule Take 1 capsule (40 mg total) by mouth daily. 30 capsule 4   potassium chloride SA (KLOR-CON M) 20 MEQ tablet Take 1 tablet (20 mEq total) by mouth every other day. 15 tablet 6   rivaroxaban (XARELTO) 20 MG TABS tablet Take 1 tablet (20 mg total) by mouth daily with supper. 90 tablet 3   rosuvastatin (CRESTOR) 40 MG tablet Take 1 tablet (40 mg total) by mouth daily. 90 tablet 3   sacubitril-valsartan (ENTRESTO) 97-103 MG Take 1 tablet by mouth 2 (two) times daily. 60 tablet 6   Semaglutide, 2 MG/DOSE, 8 MG/3ML SOPN Inject 2 mg as directed once a week. 9 mL 1   spironolactone (ALDACTONE) 25 MG tablet Take 1 tablet (25 mg total) by mouth daily. 90 tablet 3   torsemide (DEMADEX) 20 MG tablet Take 2 tablets (40 mg total) by mouth daily. Take an extra 20 mg tablet daily as needed for edema 75 tablet 6   Ipratropium-Albuterol (COMBIVENT RESPIMAT) 20-100 MCG/ACT AERS respimat Inhale 1 puff into the lungs every 6 (six) hours as needed for wheezing.     No facility-administered medications prior to visit.

## 2022-10-21 NOTE — Telephone Encounter (Signed)
Patient came into clinic today requesting a refill on medication    carvedilol (COREG) 3.125 MG tablet     [facility] (N), [place] - [address]       Please call and advise

## 2022-10-22 ENCOUNTER — Other Ambulatory Visit: Payer: Medicaid Other | Admitting: Pharmacist

## 2022-10-22 ENCOUNTER — Telehealth (HOSPITAL_COMMUNITY): Payer: Self-pay

## 2022-10-22 NOTE — Progress Notes (Signed)
10/22/2022 Name: Gabriel [name]. MRN: [id number] DOB: 1971/06/14  Chief Complaint  Patient presents with   Medication Management   Diabetes   Hypertension   Hyperlipidemia    Gabriel [name]. is a 52 y.o. year old male who presented for a telephone visit.   They were referred to the pharmacist by their PCP for assistance in managing diabetes.   Patient is participating in a Managed Medicaid Plan:  Yes  Subjective:  Care Team: Primary Care Provider: [name], FNP ; Next Scheduled Visit: not scheduled   Medication Access/Adherence  Current Pharmacy:  [facility] [address] ([address]), [place] [address] [place] ([place]) [place] [address] Phone: [phone] Fax: [place], [place] [id number] [place] [address] Phone: [phone] Fax: [phone]   Patient reports affordability concerns with their medications: No  Patient reports access/transportation concerns to their pharmacy: No  Patient reports adherence concerns with their medications:  No     Diabetes:  Current medications: Ozempic 2 mg weekly, Basaglar 36 units daily, Novolog 9 units base + 1 unit correction for every 50 mg/dL above 150 prior to meals; Farxiga 10 mg daily Medications tried in the past: metformin - GI intolerance;   Date of Download: 2/21-10/22/22 % Time CGM is active: 96% Average Glucose: 154 mg/dL Glucose Management Indicator: 7  Glucose Variability: 19 (goal <36%) Time in Goal:  - Time in range 70-180: 82% - Time above range: 18% - Time below range: 0%  Heart Failure:  Current medications:  ACEi/ARB/ARNI: Entresto 97/103 mg twice daily SGLT2i: Farxiga 10 mg daily Beta blocker: carvedilol 3.125 mg twice daily Mineralocorticoid Receptor Antagonist: spironolactone 25 mg dialy Diuretic regimen: torsemide 40-80 mg daily + potassium 20 mEq  daily  Current home weights: patient did reach 250 lbs recently, increased torsemide use.   Shortness of Breath:  Current medications: albuterol HFA  Established with Pulmonary yesterday. Patient notes he is unsure when to take albuterol.   Gout: Current medications: allopurinol 100 mg dialy, colchicine 0.6 mg PRN    Objective:  Lab Results  Component Value Date   HGBA1C 7.1 (H) 07/22/2022    Lab Results  Component Value Date   CREATININE 1.79 (H) 10/21/2022   BUN 46 (H) 10/21/2022   NA 140 10/21/2022   K 4.2 10/21/2022   CL 98 10/21/2022   CO2 30 10/21/2022    Lab Results  Component Value Date   CHOL 143 08/09/2022   HDL 24 (L) 08/09/2022   LDLCALC 62 08/09/2022   LDLDIRECT 60.6 11/23/2020   TRIG 367 (H) 08/09/2022   CHOLHDL 6.0 (H) 08/09/2022    Medications Reviewed Today     Reviewed by Gabriel [name], CMA (Certified Medical Assistant) on 10/21/22 at 0830  Med List Status: <None>   Medication Order Taking? Sig Documenting Provider Last Dose Status Informant  allopurinol (ZYLOPRIM) 100 MG tablet [id number] Yes Take 1 tablet (100 mg total) by mouth daily. Gabriel Sprout, FNP Taking Active   blood glucose meter kit and supplies KIT [id number] Yes Dispense based on patient and insurance preference. Use up to four times daily as directed. Gabriel Sprout, FNP Taking Active   carvedilol (COREG) 3.125 MG tablet [id number] Yes Take 1 tablet (3.125 mg total) by mouth 2 (two) times daily with a meal. Gabriel Gianotti, NP Taking Active   clobetasol (  TEMOVATE) 0.05 % external solution [id number] Yes Apply 1 Application topically 2 (two) times daily. Gabriel Sprout, FNP Taking Active   colchicine 0.6 MG tablet [id number] Yes Day 1: Take 2 tablets PO for gout flare, repeat 1 tablet one hour following initial dose. Day 2: Take 1 tablet twice daily PO for an additional 2-3 days. Gabriel Sprout, FNP Taking Active   Continuous Blood Gluc Sensor (FREESTYLE LIBRE 3 SENSOR) [place]  [id number] Yes Place 1 sensor on the skin every 14 days. Use to check glucose continuously Gabriel Crews, MD Taking Active   dapagliflozin propanediol (FARXIGA) 10 MG TABS tablet [id number] Yes Take 1 tablet (10 mg total) by mouth daily. Gabriel Graff, FNP Taking Active   fenofibrate (TRICOR) 145 MG tablet [id number] Yes Take 1 tablet (145 mg total) by mouth daily. Gabriel Graff, FNP Taking Active   FLUoxetine (PROZAC) 20 MG capsule [id number] Yes Take 1 capsule (20 mg total) by mouth daily. Gabriel Sprout, FNP Taking Active   Furosemide (FUROSCIX) 80 MG/10ML CTKT [id number] Yes Inject 1 Dose into the skin as needed (edema). Bensimhon, Shaune Pascal, MD Taking Active   icosapent Ethyl (VASCEPA) 1 g capsule [id number] Yes Take 2 capsules (2 g total) by mouth 2 (two) times daily. Gabriel Sprout, FNP Taking Active   insulin aspart (NOVOLOG) 100 UNIT/ML FlexPen [id number] Yes Inject 6 units + correction before breakfast/lunch and 9 units + correction before supper (correction = 1 unit per 50 mg/dL above 150); max daily dose 36 units Gabriel Joe T, FNP Taking Active   Insulin Glargine (BASAGLAR KWIKPEN) 100 UNIT/ML [id number] Yes Inject 36 Units into the skin at bedtime. Swayze, Ava, DO Taking Active     Discontinued 01/21/22 1612 (Change in therapy)            Med Note Janan Ridge   Thu Feb 14, 2022  2:37 PM)    Insulin Pen Needle (PEN NEEDLES) 32G X 4 MM MISC [id number] Yes 200 each by Does not apply route at bedtime. Gabriel Sprout, FNP Taking Active   Ipratropium-Albuterol (COMBIVENT RESPIMAT) 20-100 MCG/ACT AERS respimat [id number] Yes Inhale 1 puff into the lungs every 6 (six) hours as needed for wheezing. [provider] Taking Active Self  meclizine (ANTIVERT) 25 MG tablet [id number] Yes Take 1 tablet (25 mg total) by mouth 3 (three) times daily as needed for dizziness. Patrecia Pour, MD Taking Active   nitroGLYCERIN (NITROSTAT) 0.4 MG SL tablet [id number] Yes Place 1 tablet (0.4 mg  total) under the tongue every 5 (five) minutes x 3 doses as needed for chest pain. Gabriel Sprout, FNP Taking Active   omeprazole (PRILOSEC) 40 MG capsule [id number] Yes Take 1 capsule (40 mg total) by mouth daily. Lin Landsman, MD Taking Active   potassium chloride SA (KLOR-CON M) 20 MEQ tablet [id number] Yes Take 1 tablet (20 mEq total) by mouth every other day. Bensimhon, Shaune Pascal, MD Taking Active   rivaroxaban (XARELTO) 20 MG TABS tablet [id number] Yes Take 1 tablet (20 mg total) by mouth daily with supper. Shirley Friar, PA-C Taking Active            Med Note Jodi Mourning, Tillie Fantasia May 30, 2022  1:36 PM)    rosuvastatin (CRESTOR) 40 MG tablet [id number] Yes Take 1 tablet (40 mg total) by mouth daily. Minna Merritts, MD Taking Active   sacubitril-valsartan [facility]) 97-103 MG [id number] Yes Take 1 tablet by mouth 2 (  two) times daily. Bensimhon, Shaune Pascal, MD Taking Active   Semaglutide, 2 MG/DOSE, 8 MG/3ML SOPN [id number] Yes Inject 2 mg as directed once a week. Gabriel Sprout, FNP Taking Active   spironolactone (ALDACTONE) 25 MG tablet [id number] Yes Take 1 tablet (25 mg total) by mouth daily. Gabriel Graff, FNP Taking Active   torsemide (DEMADEX) 20 MG tablet [id number] Yes Take 2 tablets (40 mg total) by mouth daily. Take an extra 20 mg tablet daily as needed for edema Bensimhon, Shaune Pascal, MD Taking Active               Assessment/Plan:   Diabetes: - Currently uncontrolled but improved  - Reviewed long term cardiovascular and renal outcomes of uncontrolled blood sugar - Reviewed goal A1c, goal fasting, and goal 2 hour post prandial glucose - Recommend to continue current regimen. Discussed that if he anticipates a meal with higher carb content, he may need to have his base prandial dose be 10-11 units instead of 9. Patient verbalizes understanding. Consider low dose of metformin XR as a retrial moving forward.  - Recommend to check glucose continuously using  CGM   Heart Failure: - Currently appropriately managed - Encouraged to contact HF clinic; I have also [place] for recommendations - Recommend to continue current regimen at this time.     Shortness of Breath: - Currently uncontrolled.  - Reviewed appropriate inhaler technique. Counseled on using with shortness of breath, wheezing. Noted that unlike fluid pill, there is not the same negative implication (renal impairment) associated with use of albuterol if used too much, except potential side effect of tachycardia. Encouraged to contact us if development for change to levalbuterol. Encouraged to see what situations or types of SOB have benefit from albuterol rescue inhaler. He verbalized understanding.   Gout: - Currently uncontrolled - Recommend to check uric acid; titrate allopurinol to 200 mg daily if uric acid >6. Will coordinate with PCP.   Follow Up Plan: phone call in 8 weeks  Catie TJodi Mourning, PharmD, [place], [place] [facility] [phone]

## 2022-10-22 NOTE — Telephone Encounter (Signed)
Patient Advocate Encounter  Prior authorization is required for [facility]. PA submitted and APPROVED on 10/21/22.  Key [id number] Effective: 10/21/22 - 06/19/23  Clista Bernhardt, CPhT Rx Patient Advocate Phone: [phone]

## 2022-10-24 ENCOUNTER — Encounter
Admission: RE | Admit: 2022-10-24 | Discharge: 2022-10-24 | Disposition: A | Discharge status: Home / Self Care | Payer: Medicaid Other | Source: Ambulatory Visit | Attending: Student in an Organized Health Care Education/Training Program | Admitting: Student in an Organized Health Care Education/Training Program

## 2022-10-24 ENCOUNTER — Ambulatory Visit
Admission: RE | Admit: 2022-10-24 | Discharge: 2022-10-24 | Disposition: A | Discharge status: Home / Self Care | Payer: Medicaid Other | Source: Ambulatory Visit | Attending: Student in an Organized Health Care Education/Training Program | Admitting: Student in an Organized Health Care Education/Training Program

## 2022-10-24 DIAGNOSIS — I272 Pulmonary hypertension, unspecified: Secondary | ICD-10-CM | POA: Insufficient documentation

## 2022-10-24 DIAGNOSIS — I2699 Other pulmonary embolism without acute cor pulmonale: Secondary | ICD-10-CM

## 2022-10-24 DIAGNOSIS — R0602 Shortness of breath: Secondary | ICD-10-CM

## 2022-10-24 DIAGNOSIS — Z0389 Encounter for observation for other suspected diseases and conditions ruled out: Secondary | ICD-10-CM | POA: Diagnosis not present

## 2022-10-24 DIAGNOSIS — I10 Essential (primary) hypertension: Secondary | ICD-10-CM | POA: Diagnosis not present

## 2022-10-24 MED ORDER — TECHNETIUM TO 99M ALBUMIN AGGREGATED
4.1700 | Freq: Once | INTRAVENOUS | Status: AC
  Administered 2022-10-24: 4.17 via INTRAVENOUS

## 2022-10-29 ENCOUNTER — Ambulatory Visit (HOSPITAL_BASED_OUTPATIENT_CLINIC_OR_DEPARTMENT_OTHER): Payer: Medicaid Other | Admitting: Family

## 2022-10-29 ENCOUNTER — Other Ambulatory Visit
Admission: RE | Admit: 2022-10-29 | Discharge: 2022-10-29 | Disposition: A | Discharge status: Home / Self Care | Payer: Medicaid Other | Source: Ambulatory Visit | Attending: Family | Admitting: Family

## 2022-10-29 ENCOUNTER — Encounter: Payer: Self-pay | Admitting: Family

## 2022-10-29 VITALS — BP 142/78 | HR 79 | Resp 14 | Wt 248.0 lb

## 2022-10-29 DIAGNOSIS — Z87891 Personal history of nicotine dependence: Secondary | ICD-10-CM | POA: Insufficient documentation

## 2022-10-29 DIAGNOSIS — I130 Hypertensive heart and chronic kidney disease with heart failure and stage 1 through stage 4 chronic kidney disease, or unspecified chronic kidney disease: Secondary | ICD-10-CM | POA: Insufficient documentation

## 2022-10-29 DIAGNOSIS — I2510 Atherosclerotic heart disease of native coronary artery without angina pectoris: Secondary | ICD-10-CM | POA: Insufficient documentation

## 2022-10-29 DIAGNOSIS — I5022 Chronic systolic (congestive) heart failure: Secondary | ICD-10-CM

## 2022-10-29 DIAGNOSIS — N189 Chronic kidney disease, unspecified: Secondary | ICD-10-CM | POA: Insufficient documentation

## 2022-10-29 DIAGNOSIS — N1832 Chronic kidney disease, stage 3b: Secondary | ICD-10-CM

## 2022-10-29 DIAGNOSIS — M109 Gout, unspecified: Secondary | ICD-10-CM | POA: Insufficient documentation

## 2022-10-29 DIAGNOSIS — Z79899 Other long term (current) drug therapy: Secondary | ICD-10-CM | POA: Insufficient documentation

## 2022-10-29 DIAGNOSIS — J4489 Other specified chronic obstructive pulmonary disease: Secondary | ICD-10-CM | POA: Insufficient documentation

## 2022-10-29 DIAGNOSIS — I10 Essential (primary) hypertension: Secondary | ICD-10-CM

## 2022-10-29 DIAGNOSIS — J449 Chronic obstructive pulmonary disease, unspecified: Secondary | ICD-10-CM | POA: Diagnosis not present

## 2022-10-29 DIAGNOSIS — Z95810 Presence of automatic (implantable) cardiac defibrillator: Secondary | ICD-10-CM | POA: Insufficient documentation

## 2022-10-29 DIAGNOSIS — E1122 Type 2 diabetes mellitus with diabetic chronic kidney disease: Secondary | ICD-10-CM | POA: Insufficient documentation

## 2022-10-29 DIAGNOSIS — M1A0720 Idiopathic chronic gout, left ankle and foot, without tophus (tophi): Secondary | ICD-10-CM

## 2022-10-29 DIAGNOSIS — Z794 Long term (current) use of insulin: Secondary | ICD-10-CM | POA: Diagnosis not present

## 2022-10-29 DIAGNOSIS — I272 Pulmonary hypertension, unspecified: Secondary | ICD-10-CM | POA: Insufficient documentation

## 2022-10-29 LAB — BASIC METABOLIC PANEL
Anion gap: 11 (ref 5–15)
BUN: 26 mg/dL — ABNORMAL HIGH (ref 6–20)
CO2: 24 mmol/L (ref 22–32)
Calcium: 9.5 mg/dL (ref 8.9–10.3)
Chloride: 104 mmol/L (ref 98–111)
Creatinine, Ser: 1.42 mg/dL — ABNORMAL HIGH (ref 0.61–1.24)
GFR, Estimated: 59 mL/min — ABNORMAL LOW (ref >60)
Glucose, Bld: 130 mg/dL — ABNORMAL HIGH (ref 70–99)
Potassium: 3.8 mmol/L (ref 3.5–5.1)
Sodium: 139 mmol/L (ref 135–145)

## 2022-10-29 LAB — URIC ACID: Uric Acid, Serum: 8.1 mg/dL (ref 3.7–8.6)

## 2022-10-29 NOTE — Progress Notes (Signed)
REDS VEST READING= low quality CHEST RULER=42  VEST FITTING TASKS: POSTURE=sitting HEIGHT MARKER=D CENTER STRIP=aligned   COMMENTS:l

## 2022-10-29 NOTE — Addendum Note (Signed)
Addended by: [name] A on: 10/29/2022 01:08 PM   Modules accepted: Orders

## 2022-10-29 NOTE — Progress Notes (Addendum)
Patient ID: Gabriel Kinner., male    DOB: 08-02-1971, 52 y.o.   MRN: [id number]  HPI  Gabriel [name] is a 52 y/o male with a history of HTN, diabetes, COPD, CKD, asthma, previous tobacco use and chronic heart failure.    Echo report from 02/06/22 reviewed and showed an EF of 30-35% along with mild/moderate AR. Echo report from 11/24/21 reviewed and showed an EF of 20-25% along with mild LVH and mild Gabriel. Echo results from 05/21/21 reviewed and showed an EF of 40-45% along with mild LVH and mild Gabriel. Echo report from 05/21/21 reviewed and showed an EF of 40-45% along with mild LVH and mild Gabriel. Echo report from 06/08/20 reviewed and showed an EF of 35-40% along with mildly elevated PA pressure of 44.7 mmHg, moderate LAE and mild/moderate Gabriel. Echo report from 11/23/19 reviewed and showed an EF of 25-30% with mild LVH and moderately elevated PA Pressure. Echo report from 06/26/2019 reviewed and showed an EF of 30-35% along with moderately elevated PA pressure. Echo done 04/29/17 showed EF 45-50%. Echo report that was done 11/28/16 and it showed an EF of 40-45% along with mild Gabriel. EF has improved from 30-35% July 2017.   RHC/LHC done 06/13/20 and showed: Severe single-vessel coronary artery disease with chronic total occlusion of the proximal RCA.  The distal vessel fills via left to right collaterals. Mild to moderate, nonobstructive CAD involving the LAD and LCx with up to 50% stenosis. Mildly elevated left and right heart filling pressures. Moderate pulmonary hypertension (mean PAP 40 mmHg) with significantly elevated pulmonary vascular resistance (PVR 7.4 WU). Severely reduced Fick cardiac output/index.   Admitted 05/20/22 for ICD placement and discharged the next day. Admitted 05/13/22 due to syncope. Given IVF. Chest CT negative for PE. Discharged after 2 days.   He presents today for a follow-up visit with a chief complaint of minimal SOB with moderate exertion. Chronic in nature although intermittently worse.  He has associated chronic chest pain & dizziness along with this. Denies any difficulty sleeping, abdominal distention, palpitations, pedal edema, cough or weight gain.   Home weight ranges from 249-252. He does note that he doesn't feel like he's urinating enough after taking the torsemide and wonders if the dose could be increased. He was taking '40mg'$  BID but then was called and told to decrease it to '40mg'$  daily due to worsening renal function. When he got his labs checked last week, he had just used furoscix a couple of days prior.     Continues to have pain in his left foot although he does feel like it is better.   Past Medical History:  Diagnosis Date   CAD (coronary artery disease)    a. 04/2015 low risk MV;  b. 12/2016 Cath: minor irregs in LAD/Diag/LCX/OM, RCA 40p/m/d; c. 05/2020 Cath: LM nl, LAD 50d, LCX 30p, OM1 40, RCA 100p w/ L->R collats. CO/CI 3.1/1.3-->Med rx.   Chest wall pain, chronic    Chronic Troponin Elevation    CKD (chronic kidney disease), stage II-III    COPD (chronic obstructive pulmonary disease) (HCC)    Diabetes mellitus without complication ([place])    HFrEF (heart failure with reduced ejection fraction) ([place])    a. 03/2015 Echo: EF 45-50%; b. 12/2015 Echo: EF 20-25%; c. 02/2016 Echo: EF 30-35%; d. 11/2016 Echo: EF 40-45%; e. 06/2019 Echo: EF 30-35%; f. 11/2019 Echo: EF 25-30%; g. 05/2020 Echo: EF 35-40%; h. 05/2021 Echo: EF 40-45%; i. 11/2021 Echo: EF 20-25%, glob HK,  mild LVH, GrIII DD, sev red RV fxn, mild Gabriel.   Hypertension    Mitral regurgitation    Mild to moderate by October 2021 echocardiogram.   Mixed Ischemic & NICM (nonischemic cardiomyopathy) ([place])    a. 03/2015 Echo: EF 45-50%; b. 12/2015 Echo: EF 20-25%; c. 02/2016 Echo: EF 30-35%; d. 11/2016 Echo: EF 40-45%; e. 06/2019 Echo: EF 30-35%; f. 11/2019 Echo: EF 25-30%; g. 05/2020 Echo: EF 35-40%; h. 05/2021 Echo: EF 40-45%; i. 11/2021 Echo: EF 20-25%   Myocardial infarct (HCC)    NSVT (nonsustained ventricular  tachycardia) ([place])    a. 12/2015 noted on tele-->amio;  b. 12/2015 Event monitor: no VT noted.   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  12/31/19: RUL and RLL PEs.   Syncope    a. 01/2016 - felt to be vasovagal.   Past Surgical History:  Procedure Laterality Date   AMPUTATION     CARDIAC CATHETERIZATION     COLONOSCOPY WITH PROPOFOL N/A 08/01/2022   Procedure: COLONOSCOPY WITH PROPOFOL;  Surgeon: Lin Landsman, MD;  Location: [facility] ENDOSCOPY;  Service: Gastroenterology;  Laterality: N/A;   ESOPHAGOGASTRODUODENOSCOPY (EGD) WITH PROPOFOL N/A 08/01/2022   Procedure: ESOPHAGOGASTRODUODENOSCOPY (EGD) WITH PROPOFOL;  Surgeon: Lin Landsman, MD;  Location: [facility] ENDOSCOPY;  Service: Gastroenterology;  Laterality: N/A;   ESOPHAGOGASTRODUODENOSCOPY (EGD) WITH PROPOFOL N/A 10/16/2022   Procedure: ESOPHAGOGASTRODUODENOSCOPY (EGD) WITH PROPOFOL;  Surgeon: Lin Landsman, MD;  Location: [place];  Service: Gastroenterology;  Laterality: N/A;   FINGER AMPUTATION     Traumatic   FINGER FRACTURE SURGERY Left    FRACTURE SURGERY     LEFT HEART CATH AND CORONARY ANGIOGRAPHY N/A 01/06/2017   Procedure: Left Heart Cath and Coronary Angiography;  Surgeon: Wellington Hampshire, MD;  Location: [place] CV LAB;  Service: Cardiovascular;  Laterality: N/A;   RIGHT/LEFT HEART CATH AND CORONARY ANGIOGRAPHY N/A 06/13/2020   Procedure: RIGHT/LEFT HEART CATH AND CORONARY ANGIOGRAPHY;  Surgeon: Nelva Bush, MD;  Location: [place] [facility];  Service: Cardiovascular;  Laterality: N/A;   SUBQ ICD IMPLANT N/A 05/20/2022   Procedure: SUBQ ICD IMPLANT;  Surgeon: Vickie Epley, MD;  Location: [place] [facility];  Service: Cardiovascular;  Laterality: N/A;   Family History  Problem Relation Age of Onset   Diabetes Mother    Diabetes Mellitus II Mother    Hypothyroidism Mother    Hypertension Mother    Kidney  failure Mother        Dialysis   Heart attack Mother        58 yo approximately   Hypertension Father    Gout Father    Cancer Maternal Grandfather    Diabetes Maternal Grandfather    Cancer Paternal Aunt    Social History   Tobacco Use   Smoking status: Former    Packs/day: 0.50    Years: 33.00    Total pack years: 16.50    Types: Cigarettes    Quit date: 05/08/2020    Years since quitting: 2.4   Smokeless tobacco: Former    Quit date: 05/08/2020   Tobacco comments:    Quit Sept 2021  Substance Use Topics   Alcohol use: Not Currently    Comment: occassionally   Allergies  Allergen Reactions   Metformin And Related Nausea And Vomiting   Zantac [Ranitidine Hcl] Diarrhea and Nausea Only    Night sweats   Prior to Admission  medications   Medication Sig Start Date End Date Taking? Authorizing Provider  albuterol (VENTOLIN HFA) 108 (90 Base) MCG/ACT inhaler Inhale 2 puffs into the lungs every 4 (four) hours as needed for wheezing or shortness of breath. 10/21/22  Yes Dgayli, Berdine Addison, MD  allopurinol (ZYLOPRIM) 100 MG tablet Take 1 tablet (100 mg total) by mouth daily. 09/17/22  Yes [name], FNP  blood glucose meter kit and supplies KIT Dispense based on patient and insurance preference. Use up to four times daily as directed. 07/08/22  Yes Tally Joe T, FNP  carvedilol (COREG) 3.125 MG tablet TAKE 1 TABLET BY MOUTH TWICE DAILY WITH MEALS 10/21/22  Yes Theora Gianotti, NP  colchicine 0.6 MG tablet Day 1: Take 2 tablets PO for gout flare, repeat 1 tablet one hour following initial dose. Day 2: Take 1 tablet twice daily PO for an additional 2-3 days. 09/17/22  Yes [name], FNP  Continuous Blood Gluc Sensor (FREESTYLE LIBRE 3 SENSOR) MISC Place 1 sensor on the skin every 14 days. Use to check glucose continuously 05/30/22  Yes Bacigalupo, Dionne Bucy, MD  dapagliflozin propanediol (FARXIGA) 10 MG TABS tablet Take 1 tablet (10 mg total) by mouth daily. 05/09/22  Yes  Ramere Downs A, FNP  fenofibrate (TRICOR) 145 MG tablet Take 1 tablet (145 mg total) by mouth daily. 01/15/22  Yes Peyson Postema, Otila Kluver A, FNP  FLUoxetine (PROZAC) 20 MG capsule Take 1 capsule (20 mg total) by mouth daily. 02/08/22  Yes [name], FNP  icosapent Ethyl (VASCEPA) 1 g capsule Take 2 capsules (2 g total) by mouth 2 (two) times daily. 09/16/22  Yes [name], FNP  insulin aspart (NOVOLOG) 100 UNIT/ML FlexPen Inject 6 units + correction before breakfast/lunch and 9 units + correction before supper (correction = 1 unit per 50 mg/dL above 150); max daily dose 36 units Patient taking differently: Inject 10 units + correction before breakfast/lunch and supper (correction = 1 unit per 50 mg/dL above 150); max daily dose 36 units 07/09/22  Yes Tally Joe T, FNP  Insulin Glargine (BASAGLAR KWIKPEN) 100 UNIT/ML Inject 36 Units into the skin at bedtime. 01/19/22 02/09/23 Yes Swayze, Ava, DO  Insulin Pen Needle (PEN NEEDLES) 32G X 4 MM MISC 200 each by Does not apply route at bedtime. 05/09/22  Yes [name], FNP  nitroGLYCERIN (NITROSTAT) 0.4 MG SL tablet Place 1 tablet (0.4 mg total) under the tongue every 5 (five) minutes x 3 doses as needed for chest pain. 09/17/22  Yes [name], FNP  omeprazole (PRILOSEC) 40 MG capsule Take 1 capsule (40 mg total) by mouth daily. 10/16/22  Yes Vanga, Tally Due, MD  potassium chloride SA (KLOR-CON M) 20 MEQ tablet Take 1 tablet (20 mEq total) by mouth every other day. 08/27/22  Yes Bensimhon, Shaune Pascal, MD  rivaroxaban (XARELTO) 20 MG TABS tablet Take 1 tablet (20 mg total) by mouth daily with supper. 05/26/22  Yes Shirley Friar, PA-C  rosuvastatin (CRESTOR) 40 MG tablet Take 1 tablet (40 mg total) by mouth daily. 04/01/22  Yes Gollan, Kathlene November, MD  sacubitril-valsartan (ENTRESTO) 97-103 MG Take 1 tablet by mouth 2 (two) times daily. 08/27/22  Yes Bensimhon, Shaune Pascal, MD  Semaglutide, 2 MG/DOSE, 8 MG/3ML SOPN Inject 2 mg as directed once a week.  09/16/22  Yes Tally Joe T, FNP  spironolactone (ALDACTONE) 25 MG tablet Take 1 tablet (25 mg total) by mouth daily. 04/25/22  Yes Alisa Graff, FNP  torsemide (DEMADEX) 20 MG tablet Take 2 tablets (40 mg total) by mouth daily. Take an extra 20 mg tablet daily as needed for edema 08/27/22  Yes Bensimhon, Shaune Pascal, MD  Furosemide (FUROSCIX) 80 MG/10ML CTKT Inject 1 Dose into the skin as needed (edema). Patient not taking: Reported on 10/29/2022 07/22/22   Bensimhon, Shaune Pascal, MD  insulin lispro (HUMALOG) 100 UNIT/ML KwikPen Inject 3 units before breakfast and lunch. 5 units before dinner. 01/19/22 01/21/22  Swayze, Ava, DO    Review of Systems  Constitutional:  Positive for fatigue. Negative for appetite change.  HENT:  Negative for congestion, postnasal drip and sore throat.   Eyes:  Positive for visual disturbance (blurry vision).  Respiratory:  Positive for shortness of breath. Negative for cough and chest tightness.   Cardiovascular:  Positive for chest pain. Negative for leg swelling.  Gastrointestinal:  Negative for abdominal distention and abdominal pain.  Endocrine: Negative.   Genitourinary: Negative.   Musculoskeletal:  Negative for back pain and neck pain.  Skin: Negative.   Allergic/Immunologic: Negative.   Neurological:  Positive for dizziness. Negative for light-headedness.  Hematological:  Negative for adenopathy. Does not bruise/bleed easily.  Psychiatric/Behavioral:  Negative for dysphoric mood and sleep disturbance. The patient is not nervous/anxious.    Vitals:   10/29/22 1019  BP: (!) 142/78  Pulse: 79  Resp: 14  SpO2: 99%  Weight: 248 lb (112.5 kg)   Wt Readings from Last 3 Encounters:  10/29/22 248 lb (112.5 kg)  10/21/22 245 lb 12.8 oz (111.5 kg)  10/16/22 250 lb (113.4 kg)   Lab Results  Component Value Date   CREATININE 1.42 (H) 10/29/2022   CREATININE 1.79 (H) 10/21/2022   CREATININE 1.54 (H) 08/09/2022   Physical Exam Vitals and nursing note reviewed.   Constitutional:      Appearance: He is well-developed.  HENT:     Head: Normocephalic and atraumatic.  Cardiovascular:     Rate and Rhythm: Normal rate and regular rhythm.  Pulmonary:     Effort: Pulmonary effort is normal. No accessory muscle usage.     Breath sounds: No wheezing, rhonchi or rales.  Abdominal:     General: There is no distension.     Palpations: Abdomen is soft.     Tenderness: There is no abdominal tenderness.  Musculoskeletal:     Right lower leg: No tenderness. No edema.     Left lower leg: No tenderness. No edema.  Skin:    General: Skin is warm and dry.  Neurological:     General: No focal deficit present.     Mental Status: He is alert and oriented to person, place, and time.  Psychiatric:        Mood and Affect: Mood normal.        Behavior: Behavior normal.    Assessment & Plan:   1: Chronic heart failure with reduced ejection fraction- - NYHA class II/ III - euvolemic today - weighing daily and reminded to call for an overnight weight gain of >2 pounds or a weekly weight gain of >5 pounds - weight down 1 pound from last visit here 2 months ago  - echo 02/06/22: EF of 30-35% along with mild/moderate AR. Echo 11/24/21: EF of 20-25% along with mild LVH and mild Gabriel. Echo 05/21/21: EF of 40-45% along with mild LVH and mild Gabriel. - RHC/LHC done 06/13/20 and showed: Severe single-vessel coronary artery disease with chronic total occlusion of the proximal RCA.  The distal  vessel fills via left to right collaterals. Mild to moderate, nonobstructive CAD involving the LAD and LCx with up to 50% stenosis. Mildly elevated left and right heart filling pressures. Moderate pulmonary hypertension (mean PAP 40 mmHg) with significantly elevated pulmonary vascular resistance (PVR 7.4 WU). Severely reduced Fick cardiac output/index. - not adding salt and he was encouraged to closely follow a '2000mg'$  sodium diet - saw cardiology Barbarann Ehlers) 02/14/22 - carvedilol 3.'125mg'$  BID - farxiga  '10mg'$  daily - entresto 97/'103mg'$  BID - spironolactone '25mg'$  daily - torsemide '40mg'$  daily/ potassium 24mq QOD - BMP today and then will contact patient if he can increase his torsemide - attempted ReDs clip reading X3 with low quality each time - saw EP (Quentin Ore 08/28/22 - ICD placed on 05/20/22 - saw [facility] provider (Bensimhon) 08/27/22 - had to stop cardiac rehab due to transportation issues - BNP from 10/21/22 was 39.9   2: HTN- - BP 142/78 - saw PCP (Rollene Rotunda 09/17/22 - BMP from 10/21/22 reviewed and showed sodium 140, potassium 4.2, creatinine 1.79 and GFR 45   3: Diabetes-   -  A1c was 7.1% on 07/22/22 - saw nephrology (Korrapati) 03/26/22; has to schedule f/u appt  4: COPD- - stopped smoking 2021 - had smoked for close to 40 years with starting at the age of 89- at his peak, he was smoking 2-2.5 ppd - saw pulmonology (Dgayli) 10/21/22  5: ? Gout left foot- - check uric acid per PCP request and will send results to her - reports foot pain is better    Patient did not bring his medications nor a list. Each medication was verbally reviewed with the patient and he was encouraged to bring the bottles to every visit to confirm accuracy of list.   Return in 2 months, sooner if needed.

## 2022-11-01 ENCOUNTER — Ambulatory Visit
Admission: RE | Admit: 2022-11-01 | Discharge: 2022-11-01 | Disposition: A | Discharge status: Home / Self Care | Payer: Medicaid Other | Source: Ambulatory Visit | Attending: Gastroenterology | Admitting: Gastroenterology

## 2022-11-01 DIAGNOSIS — R1013 Epigastric pain: Secondary | ICD-10-CM | POA: Diagnosis not present

## 2022-11-01 DIAGNOSIS — K529 Noninfective gastroenteritis and colitis, unspecified: Secondary | ICD-10-CM | POA: Diagnosis not present

## 2022-11-01 DIAGNOSIS — Z0389 Encounter for observation for other suspected diseases and conditions ruled out: Secondary | ICD-10-CM | POA: Diagnosis not present

## 2022-11-01 MED ORDER — TECHNETIUM TC 99M SULFUR COLLOID: 2.0000 | Freq: Once | INTRAVENOUS | Status: DC | PRN

## 2022-11-01 MED ORDER — TECHNETIUM TC 99M SULFUR COLLOID
2.2300 | Freq: Once | INTRAVENOUS | Status: AC | PRN
  Administered 2022-11-01: 2.23 via ORAL

## 2022-11-04 ENCOUNTER — Telehealth: Payer: Self-pay

## 2022-11-04 ENCOUNTER — Ambulatory Visit: Payer: Medicaid Other | Admitting: Gastroenterology

## 2022-11-04 DIAGNOSIS — L4050 Arthropathic psoriasis, unspecified: Secondary | ICD-10-CM | POA: Diagnosis not present

## 2022-11-04 DIAGNOSIS — M1A00X0 Idiopathic chronic gout, unspecified site, without tophus (tophi): Secondary | ICD-10-CM | POA: Diagnosis not present

## 2022-11-04 DIAGNOSIS — M19041 Primary osteoarthritis, right hand: Secondary | ICD-10-CM | POA: Diagnosis not present

## 2022-11-04 DIAGNOSIS — L400 Psoriasis vulgaris: Secondary | ICD-10-CM | POA: Diagnosis not present

## 2022-11-04 DIAGNOSIS — M19042 Primary osteoarthritis, left hand: Secondary | ICD-10-CM | POA: Diagnosis not present

## 2022-11-04 NOTE — Telephone Encounter (Signed)
Patient verbalized understanding of results. He will let us know if he has not heard from PCP in a week.

## 2022-11-04 NOTE — Telephone Encounter (Signed)
-----   Message from Lin Landsman, MD sent at 11/04/2022  2:03 PM EDT ----- Gabriel [name]  Patient has gastroparesis confirmed on gastric emptying study.  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  I recommend to hold Ozempic for 1 month and see if his symptoms improve.  If not, I recommend to start [place]  Please inform patient of the gastric emptying study results which showed delayed gastric emptying and explains his symptoms very well.  Tell him that I communicated the results with his PCP and coming up with the plan whether or not to hold Ozempic.  I told him during last visit that Ozempic could lead to the side effects that he is experiencing  He should let us know within the next 1 to 2 weeks if he does not hear back from [place]  [employer]

## 2022-11-06 ENCOUNTER — Telehealth: Payer: Self-pay

## 2022-11-06 MED ORDER — METOCLOPRAMIDE HCL 5 MG PO TABS: 5.0000 mg | ORAL_TABLET | Freq: Three times a day (TID) | ORAL | 0 refills | Status: DC

## 2022-11-06 NOTE — Telephone Encounter (Signed)
Patient verablized understanding of instructions. He will go pick up the medication. Sent medication to [place] [address]

## 2022-11-06 NOTE — Telephone Encounter (Signed)
-----   Message from Lin Landsman, MD sent at 11/05/2022  6:05 PM EDT ----- Gabriel [name] and start reglan 5mg  before each meal and at bedtime, give him 2 weeks, he should let us know if it's helping or not  Thanks RV ----- Message ----- From: [name], FNP Sent: 11/04/2022   3:26 PM EDT To: Lin Landsman, MD; #  I am waiting to hear about restart options from pharm team. He has extensive PMH and one change unfortunately effects multiple problems. ----- Message ----- From: Lin Landsman, MD Sent: 11/04/2022   3:14 PM EDT To: Shelby Mattocks, CMA; [name], FNP  So, are you okay to hold ozempic?  Just wanted to confirm  RV  ----- Message ----- From: [name], FNP Sent: 11/04/2022   2:45 PM EDT To: Lin Landsman, MD; Shelby Mattocks, CMA  Thanks  ----- Message ----- From: Lin Landsman, MD Sent: 11/04/2022   2:03 PM EDT To: Shelby Mattocks, CMA; [name], FNP  [facility]  Patient has gastroparesis confirmed on gastric emptying study.  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  I recommend to hold Ozempic for 1 month and see if his symptoms improve.  If not, I recommend to start [place]  Please inform patient of the gastric emptying study results which showed delayed gastric emptying and explains his symptoms very well.  Tell him that I communicated the results with his PCP and coming up with the plan whether or not to hold Ozempic.  I told him during last visit that Ozempic could lead to the side effects that he is experiencing  He should let us know within the next 1 to 2 weeks if he does not hear back from [place]  [employer]

## 2022-11-07 NOTE — Telephone Encounter (Signed)
-----   Message from Osker Mason, Tiger sent at 11/07/2022  7:56 AM EDT ----- Hi all,   Sorry for the delay, I was out of the office.   I would agree with Reglan- when we spoke about his GI issues, he is adamant that they started prior to being on GLP1, and did not worsen when transitioning from Trulicity -> Ozempic, and have not worsened with titration of Ozempic over the past few months.    Thanks!  Catie Jodi Mourning, PharmD  ----- Message ----- From: Lin Landsman, MD Sent: 11/05/2022   6:09 PM EDT To: Osker Mason, RPH-CPP; #  Ezequiel Kayser and start reglan 5mg  before each meal and at bedtime, give him 2 weeks, he should let us know if it's helping or not  Thanks RV ----- Message ----- From: [name], FNP Sent: 11/04/2022   3:26 PM EDT To: Lin Landsman, MD; #  I am waiting to hear about restart options from pharm team. He has extensive PMH and one change unfortunately effects multiple problems. ----- Message ----- From: Lin Landsman, MD Sent: 11/04/2022   3:14 PM EDT To: Shelby Mattocks, CMA; [name], FNP  So, are you okay to hold ozempic?  Just wanted to confirm  RV  ----- Message ----- From: [name], FNP Sent: 11/04/2022   2:45 PM EDT To: Lin Landsman, MD; Shelby Mattocks, CMA  Thanks  ----- Message ----- From: Lin Landsman, MD Sent: 11/04/2022   2:03 PM EDT To: Shelby Mattocks, CMA; [name], FNP  [facility]  Patient has gastroparesis confirmed on gastric emptying study.  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  I recommend to hold Ozempic for 1 month and see if his symptoms improve.  If not, I recommend to start [place]  Please inform patient of the gastric emptying study results which showed delayed gastric emptying and explains his symptoms very well.  Tell him that I communicated the results with his PCP and coming up with the plan whether or not to hold  Ozempic.  I told him during last visit that Ozempic could lead to the side effects that he is experiencing  He should let us know within the next 1 to 2 weeks if he does not hear back from [place]  [employer]

## 2022-11-08 ENCOUNTER — Other Ambulatory Visit: Payer: Self-pay | Admitting: Family

## 2022-11-08 DIAGNOSIS — I5023 Acute on chronic systolic (congestive) heart failure: Secondary | ICD-10-CM

## 2022-11-11 ENCOUNTER — Other Ambulatory Visit
Admission: RE | Admit: 2022-11-11 | Discharge: 2022-11-11 | Disposition: A | Discharge status: Home / Self Care | Payer: Medicaid Other | Attending: Family | Admitting: Family

## 2022-11-11 DIAGNOSIS — I5022 Chronic systolic (congestive) heart failure: Secondary | ICD-10-CM | POA: Diagnosis not present

## 2022-11-11 DIAGNOSIS — G4733 Obstructive sleep apnea (adult) (pediatric): Secondary | ICD-10-CM | POA: Diagnosis not present

## 2022-11-11 LAB — BASIC METABOLIC PANEL
Anion gap: 11 (ref 5–15)
BUN: 30 mg/dL — ABNORMAL HIGH (ref 6–20)
CO2: 27 mmol/L (ref 22–32)
Calcium: 9.8 mg/dL (ref 8.9–10.3)
Chloride: 103 mmol/L (ref 98–111)
Creatinine, Ser: 1.55 mg/dL — ABNORMAL HIGH (ref 0.61–1.24)
GFR, Estimated: 53 mL/min — ABNORMAL LOW (ref >60)
Glucose, Bld: 130 mg/dL — ABNORMAL HIGH (ref 70–99)
Potassium: 4 mmol/L (ref 3.5–5.1)
Sodium: 141 mmol/L (ref 135–145)

## 2022-11-11 IMAGING — CR DG CHEST 2V
1 series · 2 of 2 positions shown · non-contrast
Comparison: March 28, 2021

CLINICAL DATA: Shortness of breath.  Pain.

EXAM:
CHEST - 2 VIEW

[Series 1: dg chest 2 view · 0.14mm/px · 2 of 2 slices shown]
[im 1/2]
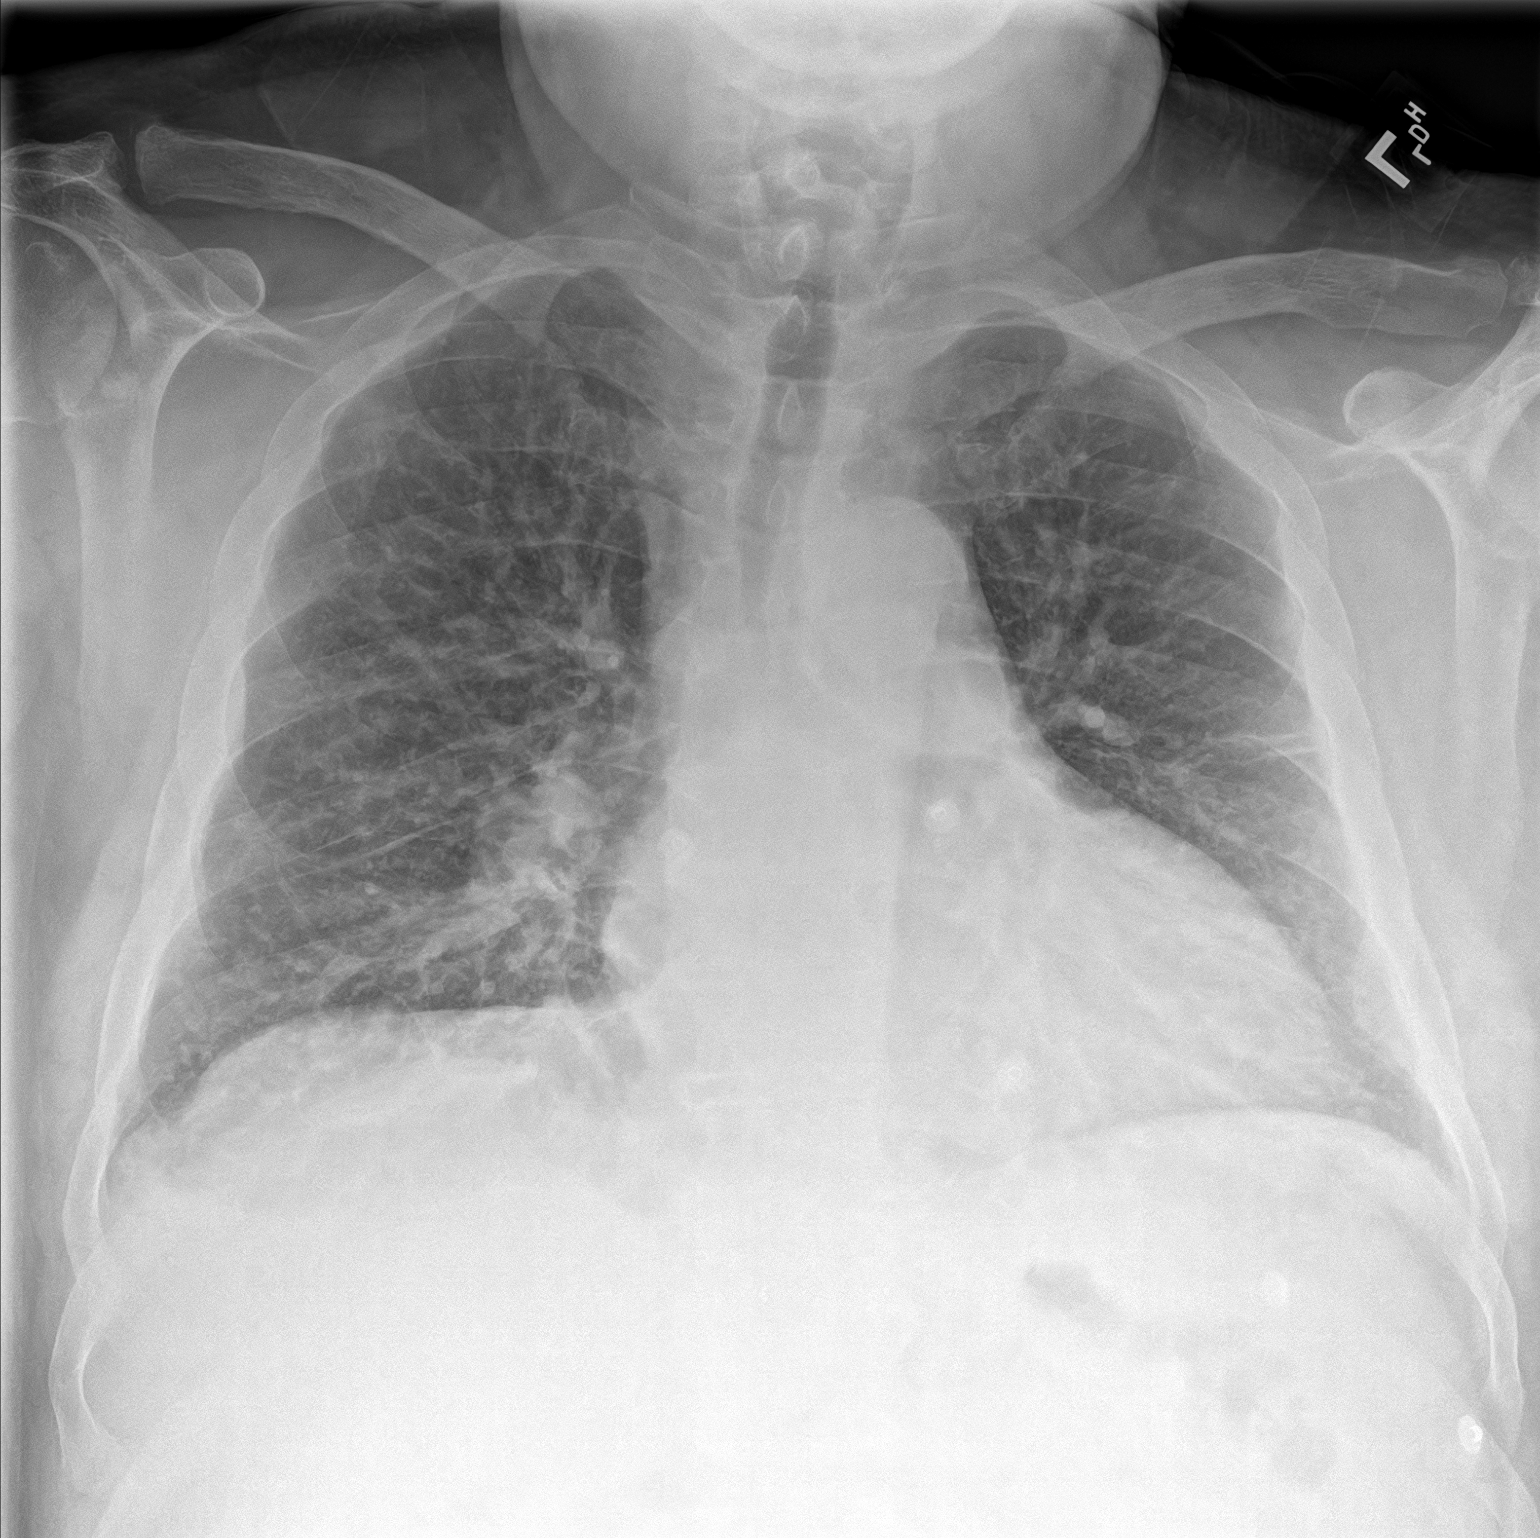
[im 2/2]
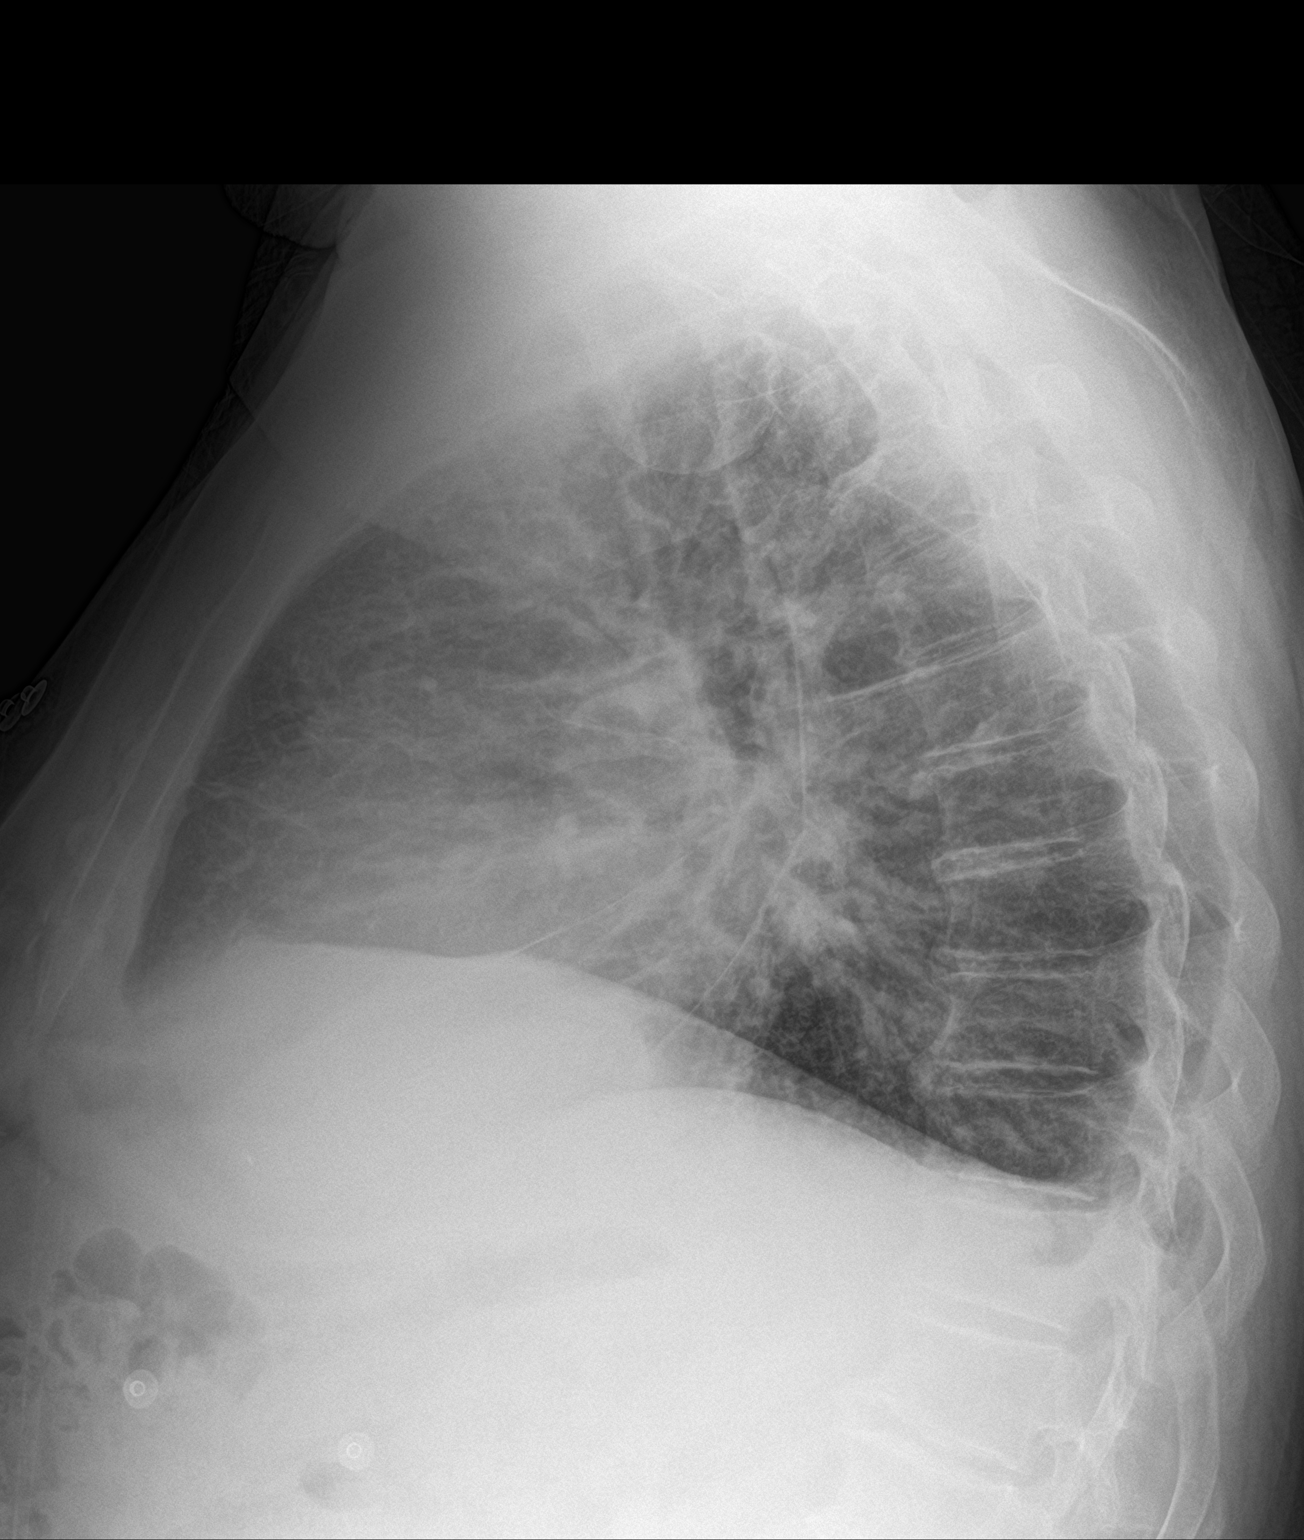

[2 of 2 positions shown; findings below may reference images not displayed]

FINDINGS: Mild cardiomegaly. The hila and mediastinum are unremarkable.
Increased interstitial markings, more prominent compared to March 01, 2021 but decreased since March 28, 2021. No pneumothorax. No
nodules or masses. No other acute abnormalities.
IMPRESSION: Findings are most consistent with mild cardiomegaly and mild
pulmonary edema.

## 2022-11-11 IMAGING — CT CT ANGIO CHEST
3 of 7 series · 18 of 46 positions shown · IV contrast (omnipaque)
Comparison: Chest radiograph earlier same day.

CLINICAL DATA: Chest pain.  Shortness of breath.

EXAM:
CT ANGIOGRAPHY CHEST WITH CONTRAST
TECHNIQUE: Multidetector CT imaging of the chest was performed using the
standard protocol during bolus administration of intravenous
contrast. Multiplanar CT image reconstructions and MIPs were
obtained to evaluate the vascular anatomy.
CONTRAST:  100mL OMNIPAQUE IOHEXOL 350 MG/ML SOLN

[Series 4: axial arterial · axial · arterial · 0.85mm/px · z∈[-138,+135]mm · 8 of 118 slices shown]
[im 14/118  lung]
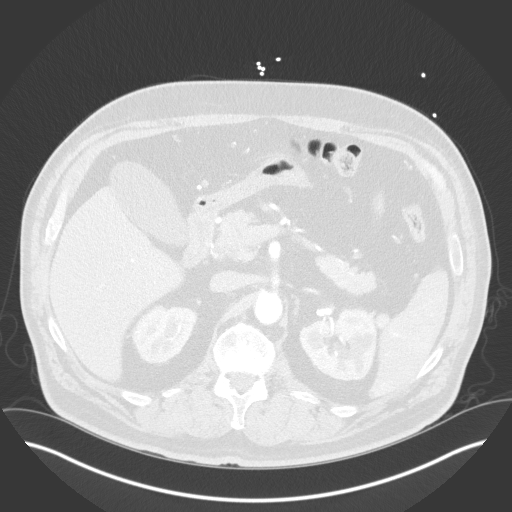
[im 27/118  soft-tissue]
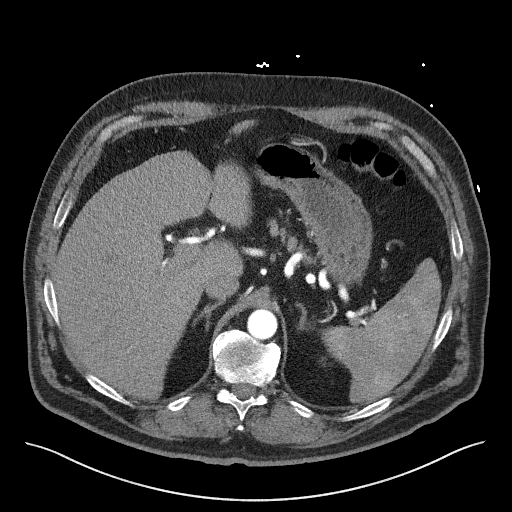
[im 40/118  lung]
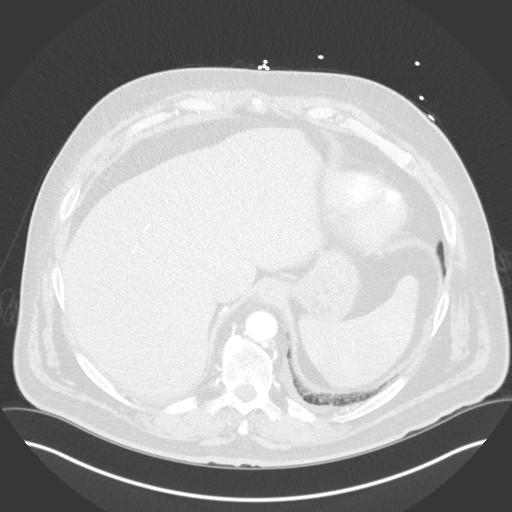
[im 53/118  soft-tissue]
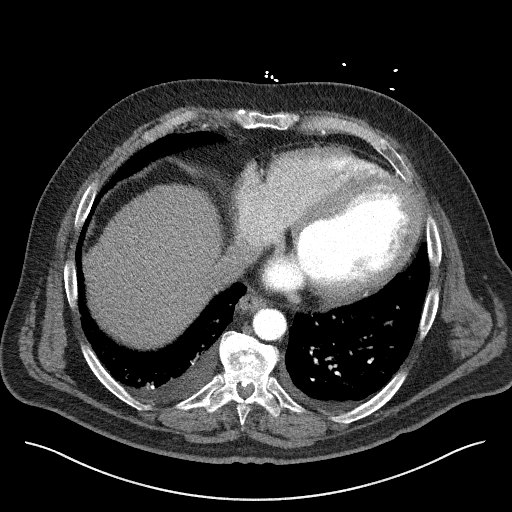
[im 66/118  lung]
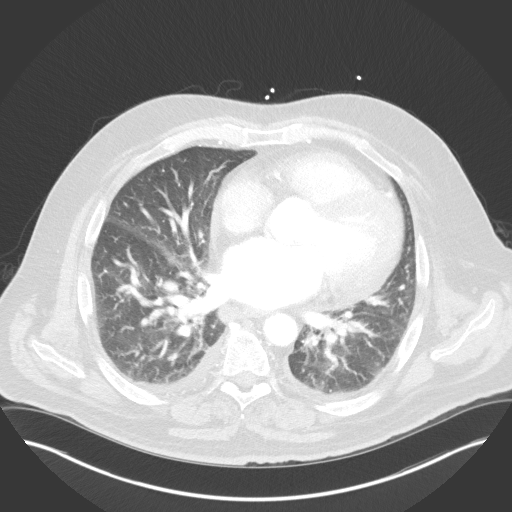
[im 79/118  soft-tissue]
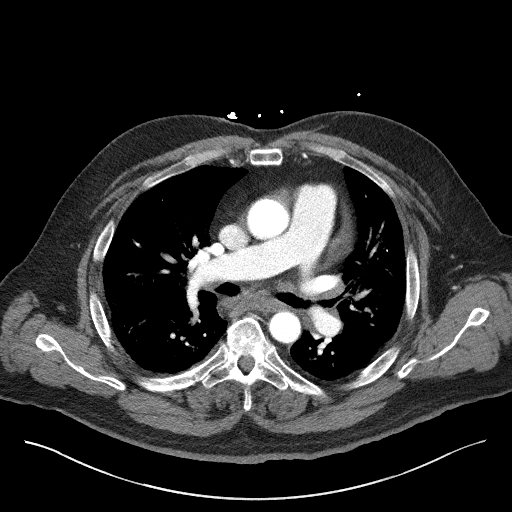
[im 92/118  lung]
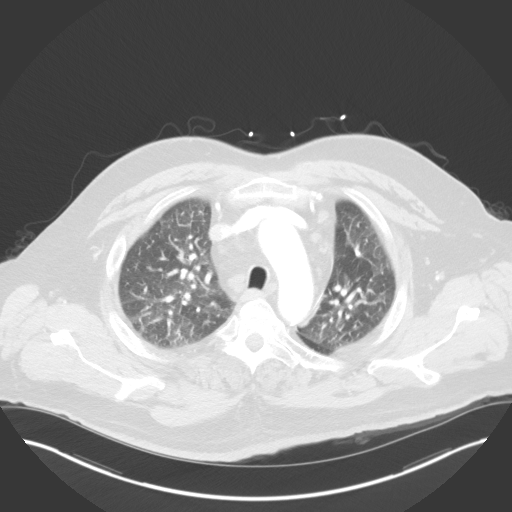
[im 105/118  soft-tissue]
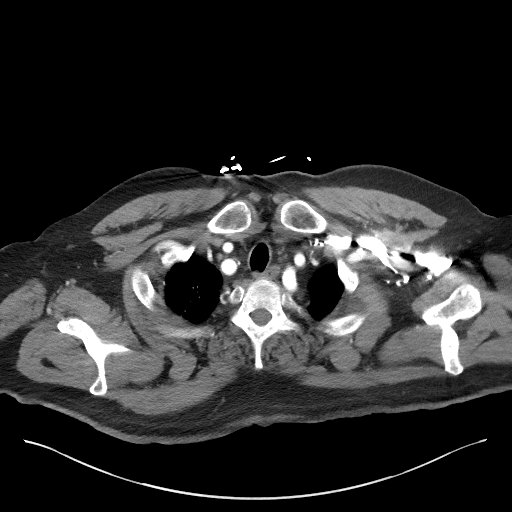

[Series 5: lung · axial · 0.85mm/px · z∈[-156,+102]mm · 7 of 177 slices shown]
[im 12/177  soft-tissue]
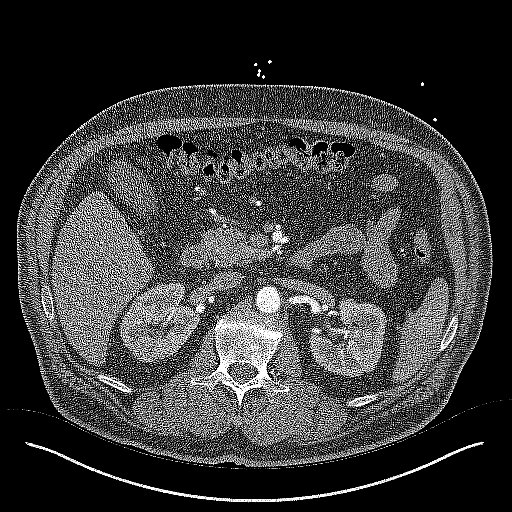
[im 36/177  soft-tissue]
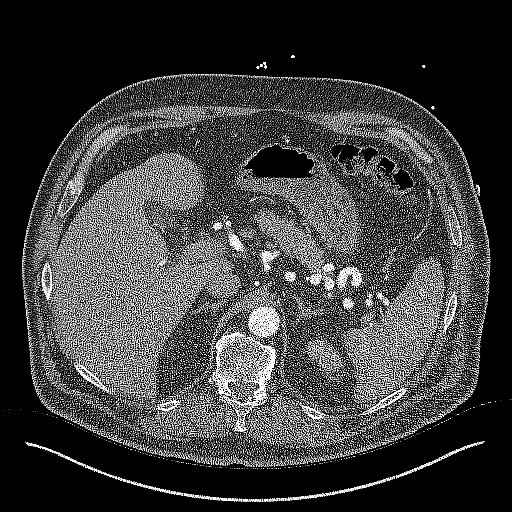
[im 59/177  soft-tissue]
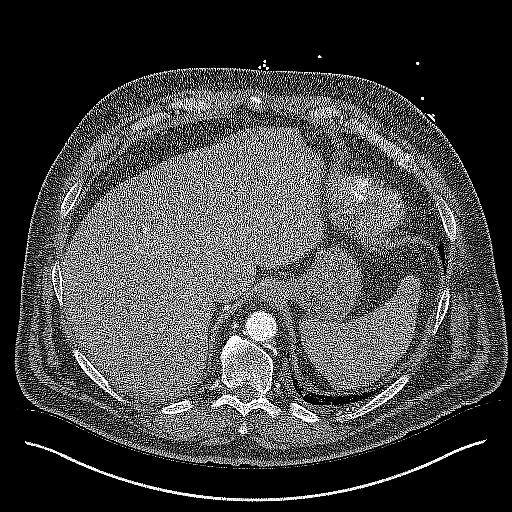
[im 83/177  soft-tissue]
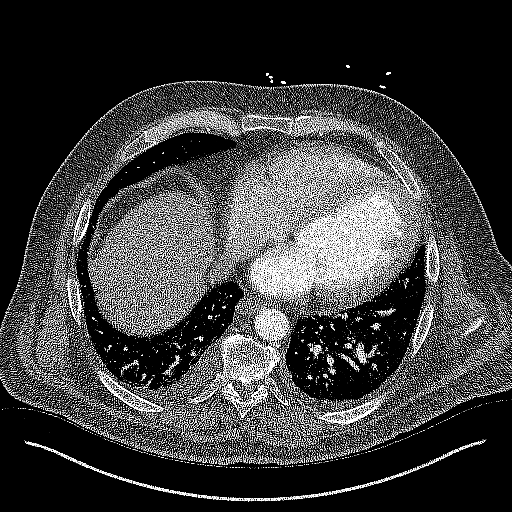
[im 94/177  soft-tissue]
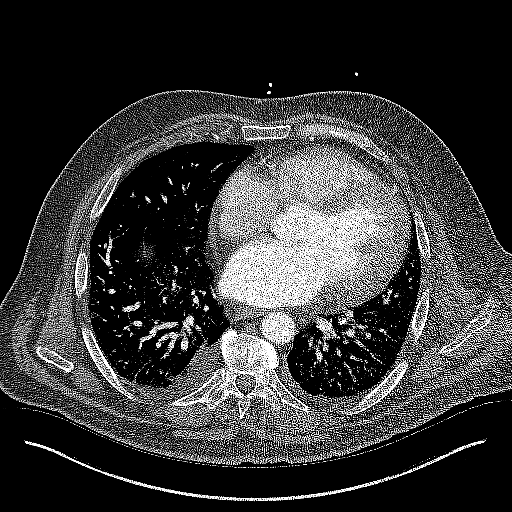
[im 118/177  soft-tissue]
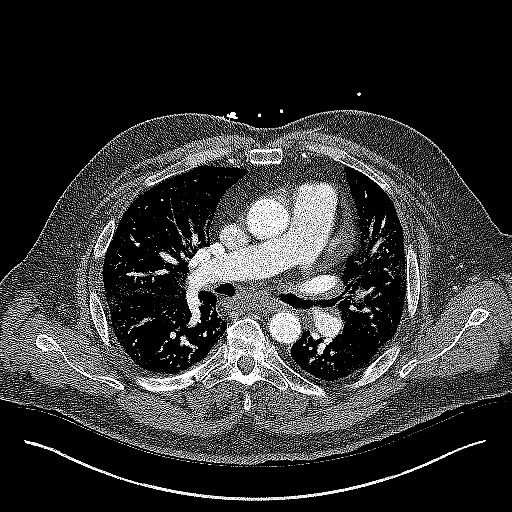
[im 141/177  soft-tissue]
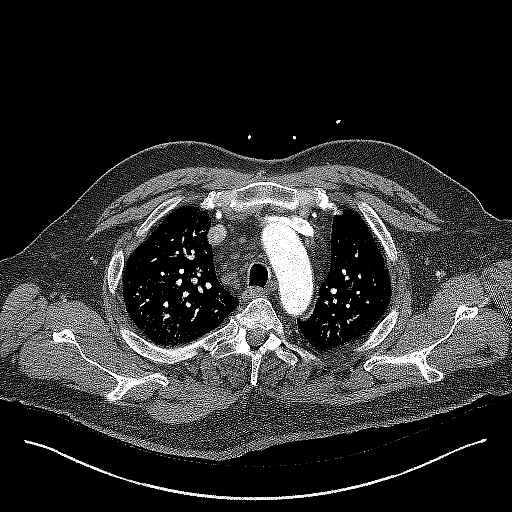

[Series 7: coronals · coronal · 0.72mm/px · 3 of 169 slices shown]
[im 43/169  soft-tissue]
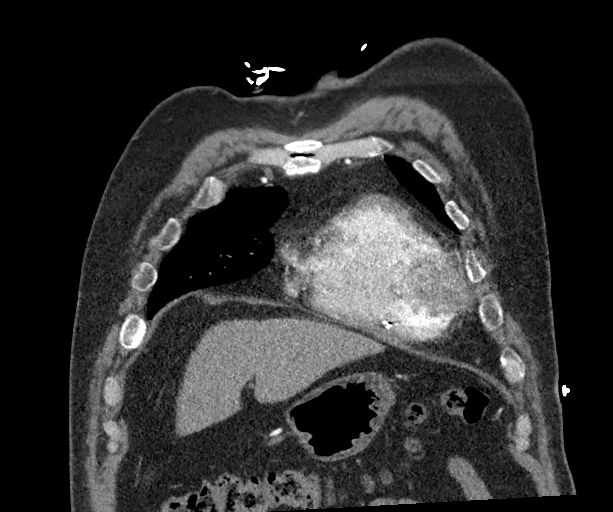
[im 85/169  soft-tissue]
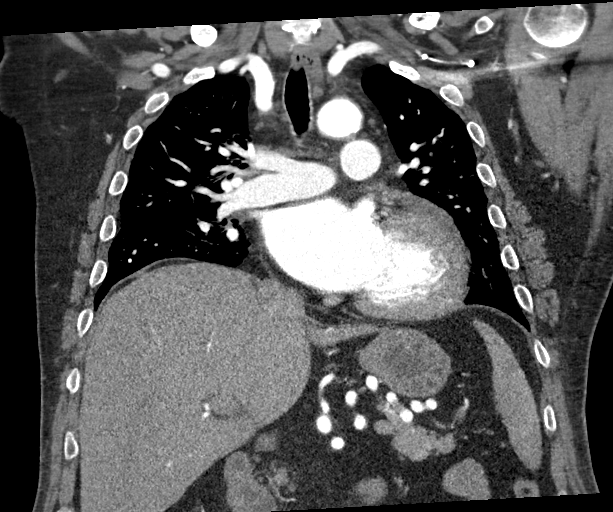
[im 127/169  soft-tissue]
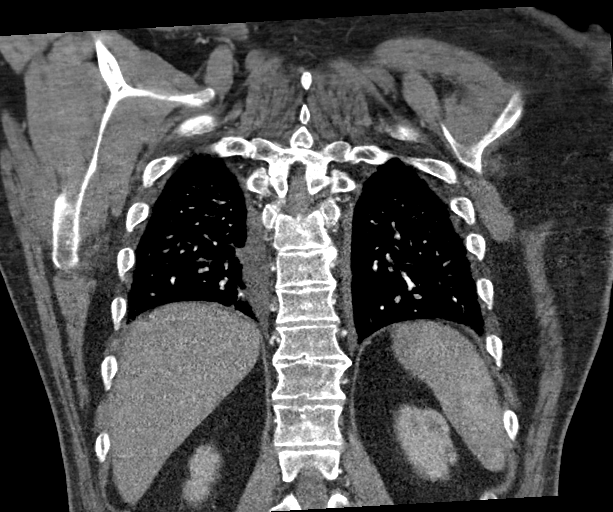

[18 of 46 positions shown; findings below may reference images not displayed]

FINDINGS: Cardiovascular: The heart is enlarged. Normal caliber thoracic
aorta. Adequate opacification of pulmonary arterial system. No
evidence for acute pulmonary embolus.

Mediastinum/Nodes: Multiple prominent subcentimeter mediastinal
lymph nodes are redemonstrated. 2 cm right infrahilar (image 46;
series 4) similar to prior. Normal appearance of the esophagus.

Lungs/Pleura: Central airways are patent. Patchy ground-glass
opacities within the lungs bilaterally. Small bilateral pleural
effusions. No pneumothorax.

Upper Abdomen: No acute process.

Musculoskeletal: Thoracic spine degenerative changes. No aggressive
or acute appearing osseous lesions.

Review of the MIP images confirms the above findings.
IMPRESSION: No acute pulmonary embolus.

Scattered bilateral patchy areas of ground-glass consolidation,
nonspecific. Possibility of pulmonary edema or atypical infection
not excluded

Small bilateral pleural effusions.

Persistent mediastinal adenopathy, predominately subcentimeter in
size. Additionally there is a 2 cm node within the right infrahilar
region. These are likely reactive in etiology. Recommend attention
on follow-up.

## 2022-11-11 NOTE — Progress Notes (Addendum)
Julianne Handler, RN Back to Top    Pt aware, agreeable, and verbalized understanding.    Alisa Graff, FNP     Sodium and potassium levels are normal. Kidney function worsened slightly over the last 2 weeks but in your normal range. Continue medications at this time.

## 2022-11-12 IMAGING — CR DG CERVICAL SPINE 2 OR 3 VIEWS
4 series · 4 of 4 positions shown · non-contrast
Comparison: None.

CLINICAL DATA: Neck pain for 2 days following turning head, initial
encounter

EXAM:
CERVICAL SPINE - 3 VIEW

[c-spine lat]
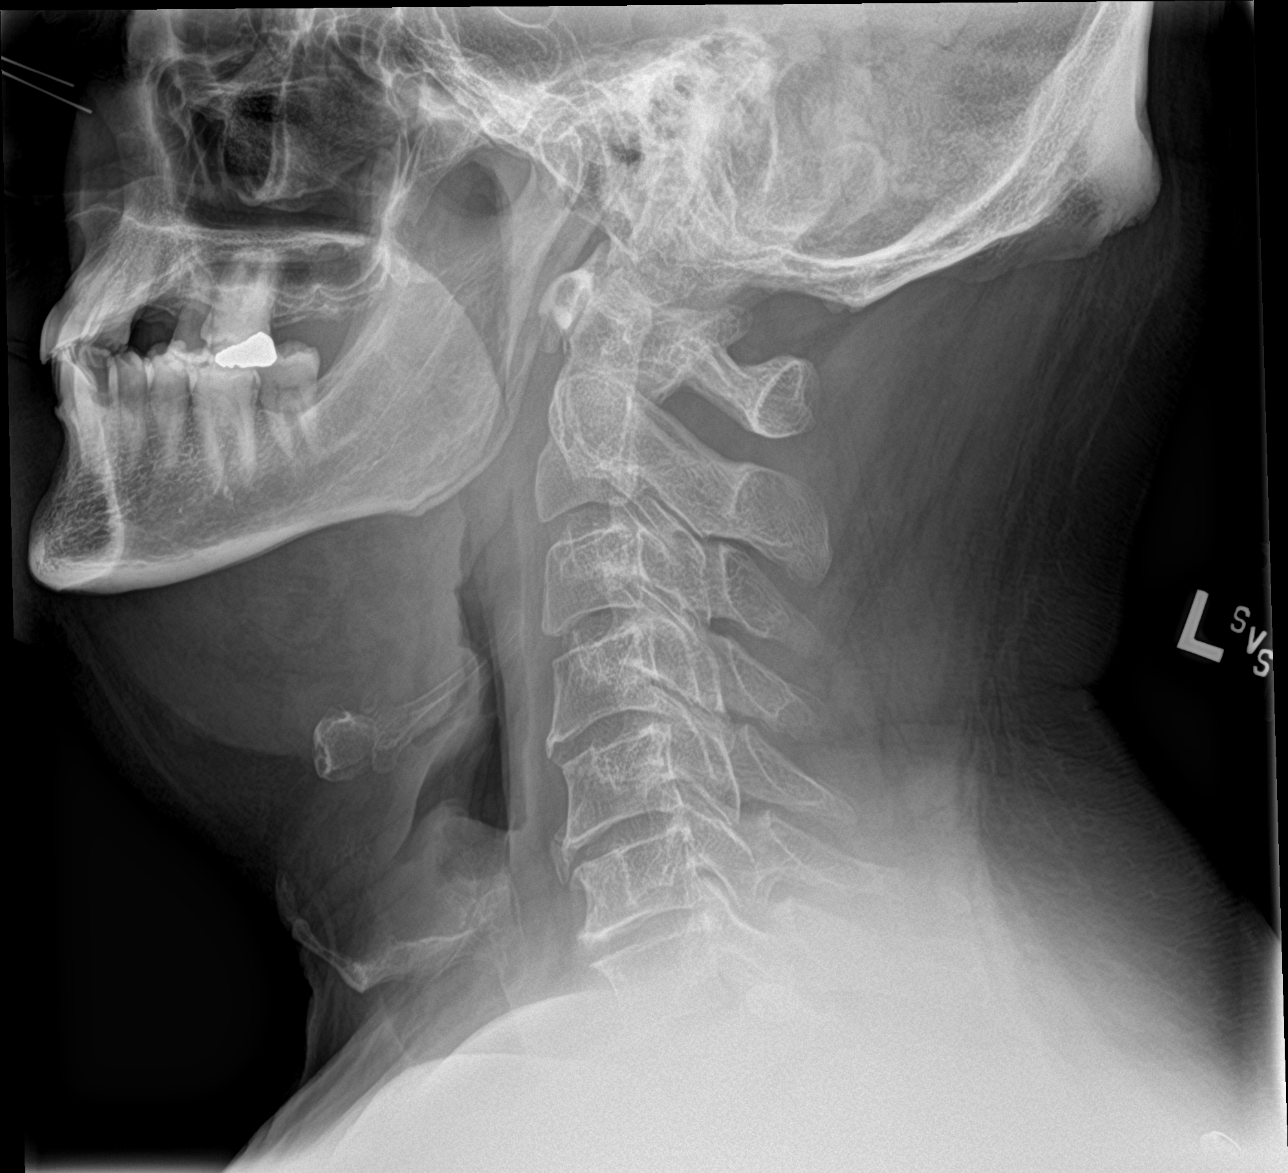

[c-spine ap]
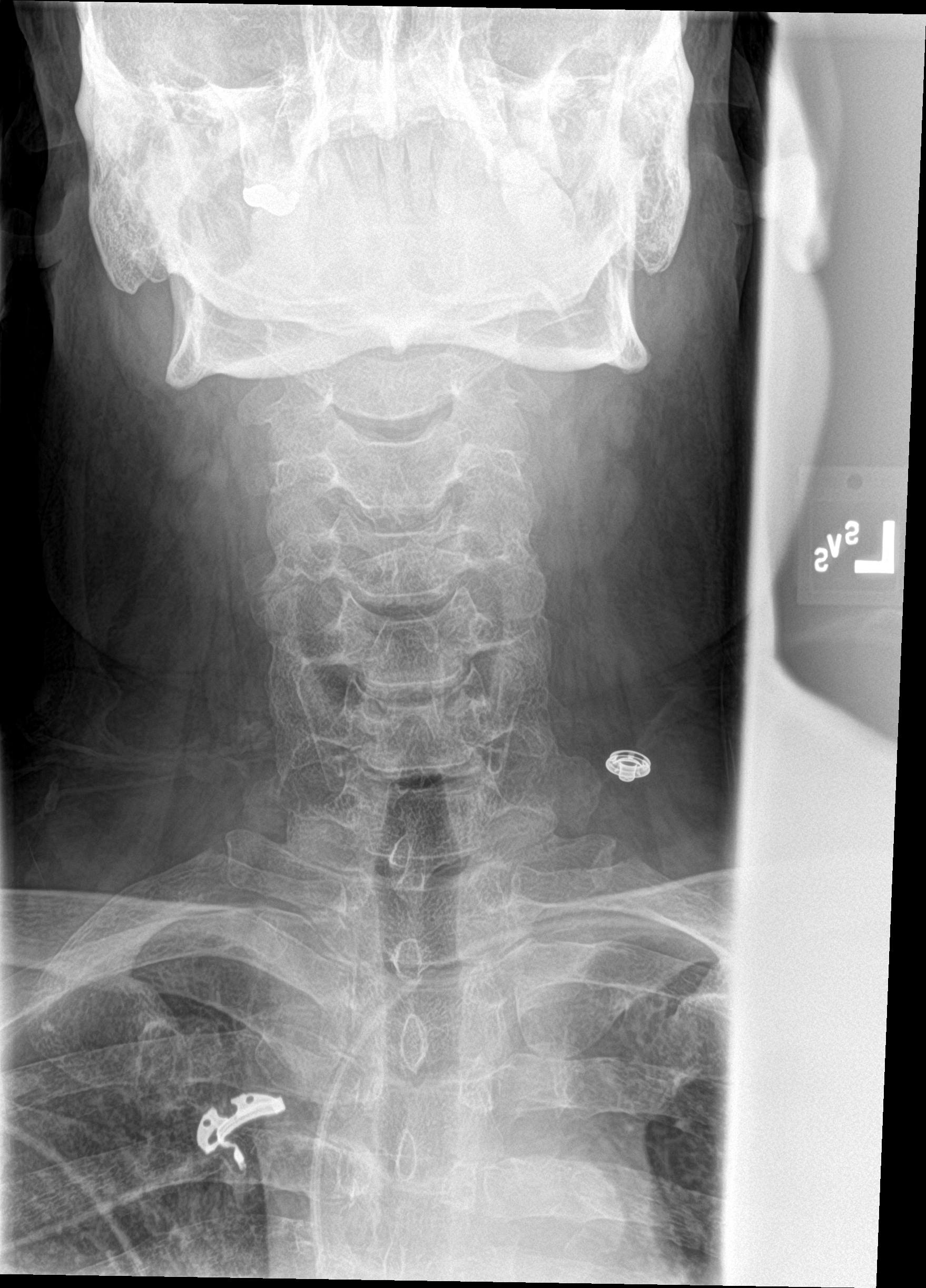

[c-spine open mouth]
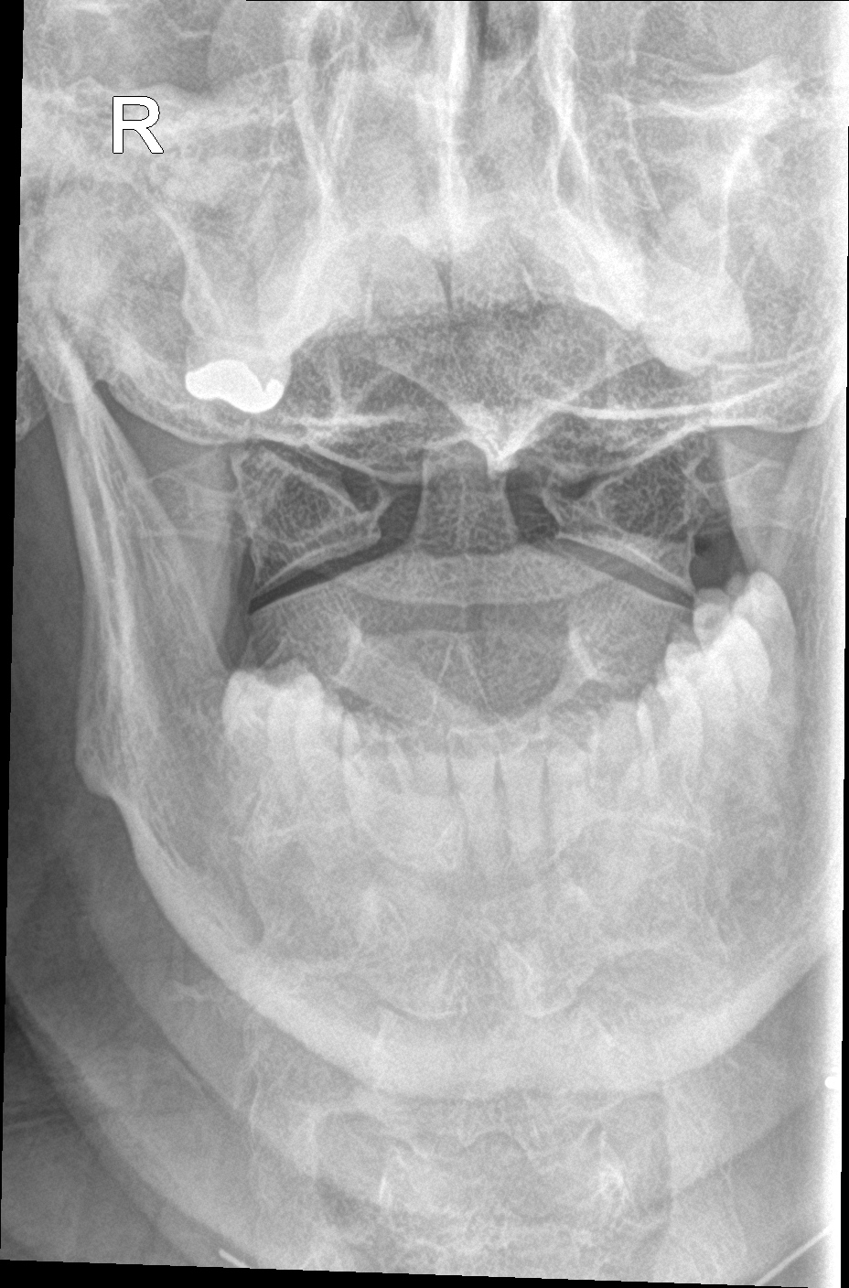

[c-spine swimmers]
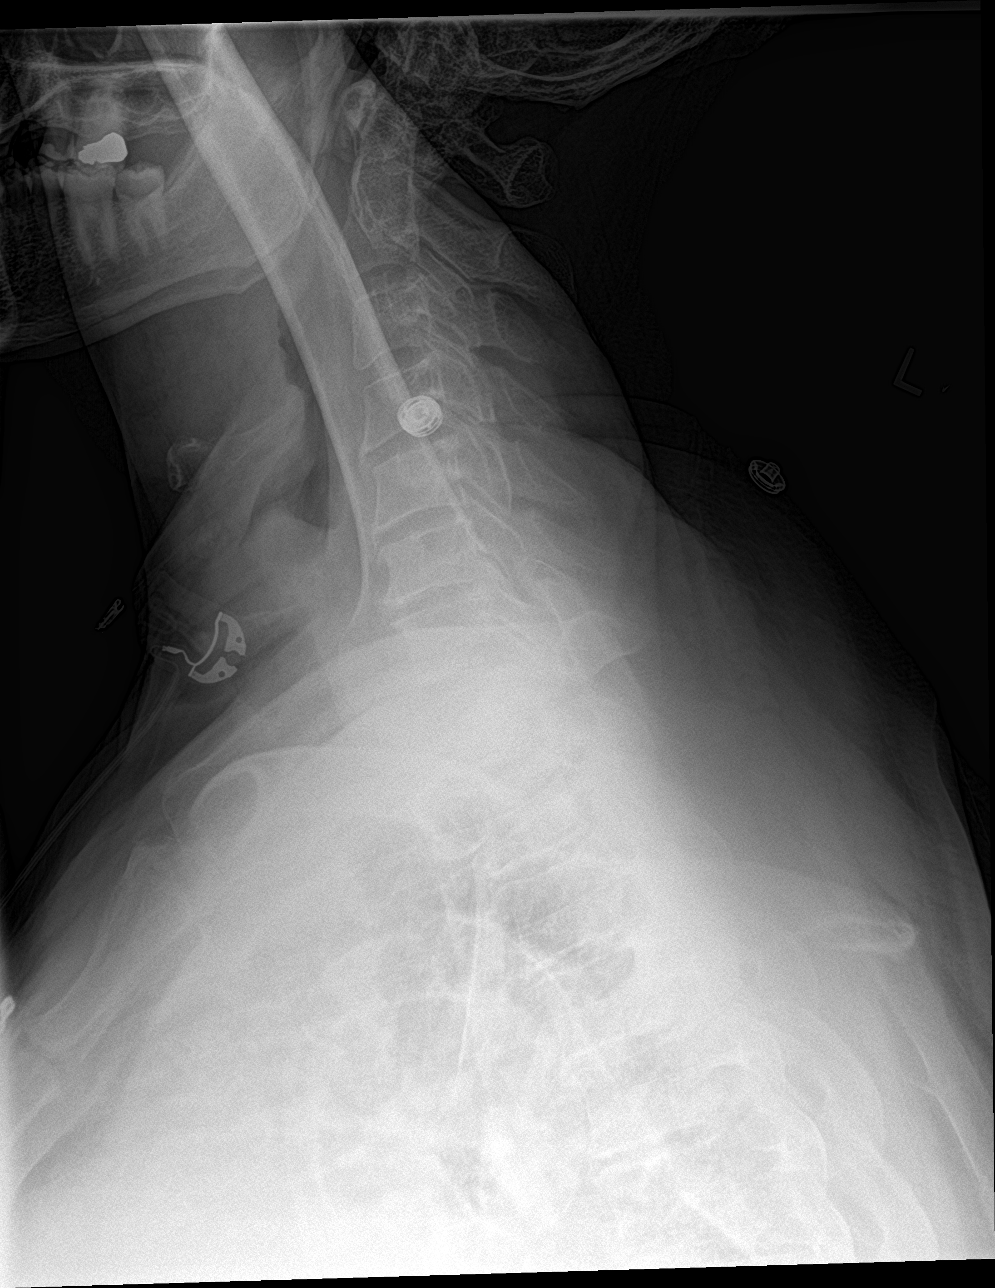

[4 of 4 positions shown; findings below may reference images not displayed]

FINDINGS: Seven cervical segments are well visualized. Vertebral body height
is well maintained. Osteophytic changes are noted from C4-C7. No
anterolisthesis is seen. The odontoid is within normal limits. No
soft tissue abnormality is seen.
IMPRESSION: Mild degenerative change without acute abnormality.

## 2022-11-14 ENCOUNTER — Telehealth: Payer: Self-pay | Admitting: Family Medicine

## 2022-11-14 NOTE — Telephone Encounter (Signed)
Prior authorization needed Key: [id number] Name: Gabriel [name] [facility] 3 sensor

## 2022-11-15 ENCOUNTER — Other Ambulatory Visit: Payer: Self-pay | Admitting: Pharmacist

## 2022-11-15 NOTE — Telephone Encounter (Signed)
Compelted PA vs Cover My Meds. Please let me know when decision is received via fax. Thanks!

## 2022-11-15 NOTE — Progress Notes (Unsigned)
Care Coordination Call  Received call from patient regarding [place] 3. Office was notified yesterday that a PA was needed. I completed this PA today via Cover My Meds, however, most of the questions on the PA were not appropriate for the product in question.   [employer] [phone]. Completed PA verbally to ensure questions were answered appropriately. They are processing PA as urgent.   Catie Hedwig Morton, PharmD, [place], [facility] [phone]

## 2022-11-15 NOTE — Telephone Encounter (Signed)
Pt is calling to check on the status of the PA. Please advise CB- [phone]

## 2022-11-18 ENCOUNTER — Other Ambulatory Visit: Payer: Self-pay | Admitting: Pharmacist

## 2022-11-18 NOTE — Progress Notes (Signed)
Care Coordination Call  [facility]. PA for Elenor Legato was approved. Research officer, trade union. They note the patient picked up [employer] sensors a few days ago.   Catie Hedwig Morton, PharmD, [place], [facility] [phone]

## 2022-11-19 ENCOUNTER — Ambulatory Visit (INDEPENDENT_AMBULATORY_CARE_PROVIDER_SITE_OTHER): Payer: Medicaid Other

## 2022-11-19 DIAGNOSIS — I428 Other cardiomyopathies: Secondary | ICD-10-CM

## 2022-11-21 LAB — CUP PACEART REMOTE DEVICE CHECK
Battery Remaining Percentage: 95 %
Date Time Interrogation Session: 20240403170700
Implantable Lead Connection Status: 753985
Implantable Lead Implant Date: 20231002
Implantable Lead Location: 753862
Implantable Lead Model: 3501
Implantable Lead Serial Number: 221766
Implantable Pulse Generator Implant Date: 20231002
Pulse Gen Serial Number: 189685

## 2022-12-09 ENCOUNTER — Telehealth: Payer: Self-pay

## 2022-12-09 NOTE — Telephone Encounter (Signed)
Patient called. Patient has been informed that his PA request for Ozempic has been approved.

## 2022-12-18 NOTE — Progress Notes (Signed)
Established patient visit  Patient: Gabriel [name].   DOB: 18-Mar-1971   52 y.o. Male  MRN: [id number] Visit Date: 12/19/2022  Today's healthcare provider: Jacky Kindle, FNP  Introduced to nurse practitioner role and practice setting.  All questions answered.  Discussed provider/patient relationship and expectations.  Subjective    HPI HPI     Sinus Problem    Additional comments: Pt stated--sinus drainage, eye red/ itching, pressure, dizzy-worse when walking or standing .2 months. Tried no medication for the symptoms.      Last edited by Shelly Bombard, CMA on 12/19/2022  8:10 AM.       Medications: Outpatient Medications Prior to Visit  Medication Sig   albuterol (VENTOLIN HFA) 108 (90 Base) MCG/ACT inhaler Inhale 2 puffs into the lungs every 4 (four) hours as needed for wheezing or shortness of breath.   allopurinol (ZYLOPRIM) 100 MG tablet Take 1 tablet (100 mg total) by mouth daily.   blood glucose meter kit and supplies KIT Dispense based on patient and insurance preference. Use up to four times daily as directed.   carvedilol (COREG) 3.125 MG tablet TAKE 1 TABLET BY MOUTH TWICE DAILY WITH MEALS   colchicine 0.6 MG tablet Day 1: Take 2 tablets PO for gout flare, repeat 1 tablet one hour following initial dose. Day 2: Take 1 tablet twice daily PO for an additional 2-3 days.   Continuous Blood Gluc Sensor (FREESTYLE LIBRE 3 SENSOR) MISC Place 1 sensor on the skin every 14 days. Use to check glucose continuously   dapagliflozin propanediol (FARXIGA) 10 MG TABS tablet Take 1 tablet (10 mg total) by mouth daily.   fenofibrate (TRICOR) 145 MG tablet Take 1 tablet (145 mg total) by mouth daily.   FLUoxetine (PROZAC) 20 MG capsule Take 1 capsule (20 mg total) by mouth daily.   Furosemide (FUROSCIX) 80 MG/10ML CTKT Inject 1 Dose into the skin as needed (edema).   icosapent Ethyl (VASCEPA) 1 g capsule Take 2 capsules (2 g total) by mouth 2 (two) times daily.   insulin aspart  (NOVOLOG) 100 UNIT/ML FlexPen Inject 6 units + correction before breakfast/lunch and 9 units + correction before supper (correction = 1 unit per 50 mg/dL above 119); max daily dose 36 units (Patient taking differently: Inject 10 units + correction before breakfast/lunch and supper (correction = 1 unit per 50 mg/dL above 147); max daily dose 36 units)   Insulin Glargine (BASAGLAR KWIKPEN) 100 UNIT/ML Inject 36 Units into the skin at bedtime.   Insulin Pen Needle (PEN NEEDLES) 32G X 4 MM MISC 200 each by Does not apply route at bedtime.   nitroGLYCERIN (NITROSTAT) 0.4 MG SL tablet Place 1 tablet (0.4 mg total) under the tongue every 5 (five) minutes x 3 doses as needed for chest pain.   omeprazole (PRILOSEC) 40 MG capsule Take 1 capsule (40 mg total) by mouth daily.   potassium chloride SA (KLOR-CON M) 20 MEQ tablet Take 1 tablet (20 mEq total) by mouth every other day.   rivaroxaban (XARELTO) 20 MG TABS tablet Take 1 tablet (20 mg total) by mouth daily with supper.   rosuvastatin (CRESTOR) 40 MG tablet Take 1 tablet (40 mg total) by mouth daily.   sacubitril-valsartan (ENTRESTO) 97-103 MG Take 1 tablet by mouth 2 (two) times daily.   Semaglutide, 2 MG/DOSE, 8 MG/3ML SOPN Inject 2 mg as directed once a week.   spironolactone (ALDACTONE) 25 MG tablet Take 1 tablet (25 mg total) by  mouth daily.   torsemide (DEMADEX) 20 MG tablet Take 2 tablets (40 mg total) by mouth daily. Take an extra 20 mg tablet daily as needed for edema   metoCLOPramide (REGLAN) 5 MG tablet Take 1 tablet (5 mg total) by mouth 4 (four) times daily -  before meals and at bedtime for 14 days.   No facility-administered medications prior to visit.    Review of Systems    Objective    BP 118/67 (BP Location: Left Arm, Patient Position: Sitting, Cuff Size: Large)   Pulse 85   Temp 97.6 F (36.4 C)   Ht 5\' 11"  (1.803 m)   Wt 247 lb (112 kg)   SpO2 97%   BMI 34.45 kg/m   Physical Exam Vitals and nursing note reviewed.   Constitutional:      Appearance: Normal appearance. He is obese.  HENT:     Head: Normocephalic and atraumatic.     Right Ear: Tympanic membrane, ear canal and external ear normal.     Left Ear: Tympanic membrane, ear canal and external ear normal.     Nose: Congestion present.     Mouth/Throat:     Mouth: Mucous membranes are moist.     Pharynx: Oropharynx is clear. No oropharyngeal exudate or posterior oropharyngeal erythema.  Eyes:     Extraocular Movements: Extraocular movements intact.     Pupils: Pupils are equal, round, and reactive to light.     Comments: Allergic shiners  Cardiovascular:     Rate and Rhythm: Normal rate and regular rhythm.     Pulses: Normal pulses.     Heart sounds: Normal heart sounds.  Pulmonary:     Effort: Pulmonary effort is normal.     Breath sounds: Normal breath sounds.  Musculoskeletal:        General: Normal range of motion.     Cervical back: Normal range of motion.  Skin:    General: Skin is warm and dry.     Capillary Refill: Capillary refill takes less than 2 seconds.  Neurological:     General: No focal deficit present.     Mental Status: He is alert and oriented to person, place, and time. Mental status is at baseline.  Psychiatric:        Mood and Affect: Mood normal.        Behavior: Behavior normal.        Thought Content: Thought content normal.        Judgment: Judgment normal.    No results found for any visits on 12/19/22.  Assessment & Plan     Problem List Items Addressed This Visit       Respiratory   Seasonal allergic rhinitis due to pollen - Primary    Acute on chronic symptoms x2 months; has not tried OTC medications to assist Benign, self limiting presentation Recommend oral antihistamine as well as start of nasal steroid and nasal antihistamine Return to clinic/call clinic in 1 week if s/s do not improve       Return if symptoms worsen or fail to improve.     Leilani Merl, FNP, have reviewed all  documentation for this visit. The documentation on 12/19/22 for the exam, diagnosis, procedures, and orders are all accurate and complete.  Jacky Kindle, FNP  [facility] [phone] (phone) [phone] (fax)  [facility]

## 2022-12-19 ENCOUNTER — Encounter: Payer: Self-pay | Admitting: Family Medicine

## 2022-12-19 ENCOUNTER — Ambulatory Visit (INDEPENDENT_AMBULATORY_CARE_PROVIDER_SITE_OTHER): Payer: Medicaid Other | Admitting: Family Medicine

## 2022-12-19 VITALS — BP 118/67 | HR 85 | Temp 97.6°F | Ht 71.0 in | Wt 247.0 lb

## 2022-12-19 DIAGNOSIS — J301 Allergic rhinitis due to pollen: Secondary | ICD-10-CM | POA: Insufficient documentation

## 2022-12-19 MED ORDER — CETIRIZINE HCL 10 MG PO TABS: 10.0000 mg | ORAL_TABLET | Freq: Every day | ORAL | 11 refills | Status: DC

## 2022-12-19 MED ORDER — FLUTICASONE PROPIONATE 50 MCG/ACT NA SUSP: 2.0000 | Freq: Every day | NASAL | 6 refills | Status: DC

## 2022-12-19 MED ORDER — AZELASTINE HCL 0.1 % NA SOLN: 2.0000 | Freq: Two times a day (BID) | NASAL | 12 refills | Status: DC

## 2022-12-19 NOTE — Assessment & Plan Note (Signed)
Acute on chronic symptoms x2 months; has not tried OTC medications to assist Benign, self limiting presentation Recommend oral antihistamine as well as start of nasal steroid and nasal antihistamine Return to clinic/call clinic in 1 week if s/s do not improve

## 2022-12-21 ENCOUNTER — Emergency Department
Admission: EM | Admit: 2022-12-21 | Discharge: 2022-12-21 | Disposition: A | Discharge status: Home / Self Care | Payer: Medicaid Other | Attending: Emergency Medicine | Admitting: Emergency Medicine

## 2022-12-21 ENCOUNTER — Emergency Department: Payer: Medicaid Other

## 2022-12-21 ENCOUNTER — Other Ambulatory Visit: Payer: Self-pay

## 2022-12-21 ENCOUNTER — Encounter: Payer: Self-pay | Admitting: Intensive Care

## 2022-12-21 DIAGNOSIS — R0602 Shortness of breath: Secondary | ICD-10-CM | POA: Diagnosis not present

## 2022-12-21 DIAGNOSIS — I509 Heart failure, unspecified: Secondary | ICD-10-CM | POA: Diagnosis not present

## 2022-12-21 DIAGNOSIS — R42 Dizziness and giddiness: Secondary | ICD-10-CM | POA: Insufficient documentation

## 2022-12-21 DIAGNOSIS — R079 Chest pain, unspecified: Secondary | ICD-10-CM | POA: Insufficient documentation

## 2022-12-21 DIAGNOSIS — R0789 Other chest pain: Secondary | ICD-10-CM | POA: Diagnosis not present

## 2022-12-21 DIAGNOSIS — J810 Acute pulmonary edema: Secondary | ICD-10-CM | POA: Diagnosis not present

## 2022-12-21 LAB — TROPONIN I (HIGH SENSITIVITY)
Troponin I (High Sensitivity): 35 ng/L — ABNORMAL HIGH (ref <18)
Troponin I (High Sensitivity): 31 ng/L — ABNORMAL HIGH (ref <18)

## 2022-12-21 LAB — CBC WITH DIFFERENTIAL/PLATELET
Abs Immature Granulocytes: 0.03 10*3/uL (ref 0.00–0.07)
Basophils Absolute: 0.1 10*3/uL (ref 0.0–0.1)
Basophils Relative: 1 %
Eosinophils Absolute: 0.2 10*3/uL (ref 0.0–0.5)
Eosinophils Relative: 2 %
HCT: 55.5 % — ABNORMAL HIGH (ref 39.0–52.0)
Hemoglobin: 18.2 g/dL — ABNORMAL HIGH (ref 13.0–17.0)
Immature Granulocytes: 0 %
Lymphocytes Relative: 33 %
Lymphs Abs: 2.9 10*3/uL (ref 0.7–4.0)
MCH: 27.7 pg (ref 26.0–34.0)
MCHC: 32.8 g/dL (ref 30.0–36.0)
MCV: 84.3 fL (ref 80.0–100.0)
Monocytes Absolute: 0.6 10*3/uL (ref 0.1–1.0)
Monocytes Relative: 7 %
Neutro Abs: 5.1 10*3/uL (ref 1.7–7.7)
Neutrophils Relative %: 57 %
Platelets: 209 10*3/uL (ref 150–400)
RBC: 6.58 MIL/uL — ABNORMAL HIGH (ref 4.22–5.81)
RDW: 14.2 % (ref 11.5–15.5)
WBC: 8.8 10*3/uL (ref 4.0–10.5)
nRBC: 0 % (ref 0.0–0.2)

## 2022-12-21 LAB — COMPREHENSIVE METABOLIC PANEL
ALT: 30 U/L (ref 0–44)
AST: 23 U/L (ref 15–41)
Albumin: 4.4 g/dL (ref 3.5–5.0)
Alkaline Phosphatase: 62 U/L (ref 38–126)
Anion gap: 11 (ref 5–15)
BUN: 24 mg/dL — ABNORMAL HIGH (ref 6–20)
CO2: 24 mmol/L (ref 22–32)
Calcium: 9.5 mg/dL (ref 8.9–10.3)
Chloride: 104 mmol/L (ref 98–111)
Creatinine, Ser: 1.62 mg/dL — ABNORMAL HIGH (ref 0.61–1.24)
GFR, Estimated: 50 mL/min — ABNORMAL LOW (ref >60)
Glucose, Bld: 155 mg/dL — ABNORMAL HIGH (ref 70–99)
Potassium: 3.9 mmol/L (ref 3.5–5.1)
Sodium: 139 mmol/L (ref 135–145)
Total Bilirubin: 1.2 mg/dL (ref 0.3–1.2)
Total Protein: 7.7 g/dL (ref 6.5–8.1)

## 2022-12-21 LAB — BRAIN NATRIURETIC PEPTIDE: B Natriuretic Peptide: 34 pg/mL (ref 0.0–100.0)

## 2022-12-21 NOTE — ED Provider Notes (Signed)
[facility] Provider Note   Event Date/Time   First MD Initiated Contact with Patient 12/21/22 [phone]     (approximate) History  Chest Pain and Dizziness  HPI Gabriel Cada. is a 52 y.o. male with send past medical history of CHF who presents for chest pain, lightheadedness, and allergy symptoms are worsening over the past 2 weeks.  Patient states that he was given intranasal allergy spray that has not improved his symptoms.  Patient has not tried any other interventions for the symptoms.  Patient endorses worsening of the symptoms with exertion ROS: Patient currently denies any vision changes, tinnitus, difficulty speaking, facial droop, sore throat, shortness of breath, abdominal pain, nausea/vomiting/diarrhea, dysuria, or weakness/numbness/paresthesias in any extremity   Physical Exam  Triage Vital Signs: ED Triage Vitals  Enc Vitals Group     BP --      Pulse --      Resp --      Temp --      Temp src --      SpO2 --      Weight 12/21/22 0902 245 lb (111.1 kg)     Height 12/21/22 0902 5\' 11"  (1.803 m)     Head Circumference --      Peak Flow --      Pain Score 12/21/22 0901 6     Pain Loc --      Pain Edu? --      Excl. in GC? --    Most recent vital signs: Vitals:   12/21/22 1000 12/21/22 1335  BP: 112/72 114/69  Pulse: 78 73  Resp: 19 17  SpO2: 98% 98%   General: Awake, oriented x4. CV:  Good peripheral perfusion.  Resp:  Normal effort.  Abd:  No distention.  Other:  Middle-aged overweight Caucasian male laying in bed in no acute distress ED Results / Procedures / Treatments  Labs (all labs ordered are listed, but only abnormal results are displayed) Labs Reviewed  CBC WITH DIFFERENTIAL/PLATELET - Abnormal; Notable for the following components:      Result Value   RBC 6.58 (*)    Hemoglobin 18.2 (*)    HCT 55.5 (*)    All other components within normal limits  COMPREHENSIVE METABOLIC PANEL - Abnormal; Notable for the following  components:   Glucose, Bld 155 (*)    BUN 24 (*)    Creatinine, Ser 1.62 (*)    GFR, Estimated 50 (*)    All other components within normal limits  TROPONIN I (HIGH SENSITIVITY) - Abnormal; Notable for the following components:   Troponin I (High Sensitivity) 35 (*)    All other components within normal limits  TROPONIN I (HIGH SENSITIVITY) - Abnormal; Notable for the following components:   Troponin I (High Sensitivity) 31 (*)    All other components within normal limits  BRAIN NATRIURETIC PEPTIDE   EKG ED ECG REPORT I, Merwyn Katos, the attending physician, personally viewed and interpreted this ECG. Date: 12/21/2022 EKG Time: 0853 Rate: 96 Rhythm: normal sinus rhythm QRS Axis: normal Intervals: normal ST/T Wave abnormalities: normal Narrative Interpretation: no evidence of acute ischemia RADIOLOGY ED MD interpretation: Single view portable chest x-ray interpreted independently by me and shows mildly worsening bilateral interstitial opacities concerning for pulmonary edema -Agree with radiology assessment Official radiology report(s): DG Chest Port 1 View  Result Date: 12/21/2022 CLINICAL DATA:  52 year old male with chest pain, shortness of breath, lightheaded. EXAM: PORTABLE CHEST 1 VIEW COMPARISON:  Chest radiographs 10/24/2022 and earlier. FINDINGS: Portable AP view at 1119 hours. External cardiac defibrillator remains in place, seen to be within the anterior chest wall on the previous lateral. Stable lung volumes and mediastinal contours. Visualized tracheal air column is within normal limits. Mildly increased and symmetric bilateral pulmonary interstitial opacity. No pneumothorax, pleural effusion pulmonary consolidation. Visualized tracheal air column is within normal limits. No acute osseous abnormality identified. IMPRESSION: Mild symmetrically increased bilateral pulmonary interstitial opacity since March suspicious for mild or developing pulmonary edema. Electronically  Signed   By: Odessa Fleming M.D.   On: 12/21/2022 11:31   PROCEDURES: Critical Care performed: No .1-3 Lead EKG Interpretation  Performed by: Merwyn Katos, MD Authorized by: Merwyn Katos, MD     Interpretation: normal     ECG rate:  71   ECG rate assessment: normal     Rhythm: sinus rhythm     Ectopy: none     Conduction: normal    MEDICATIONS ORDERED IN ED: Medications - No data to display IMPRESSION / MDM / ASSESSMENT AND PLAN / ED COURSE  I reviewed the triage vital signs and the nursing notes.                             The patient is on the cardiac monitor to evaluate for evidence of arrhythmia and/or significant heart rate changes. Patient's presentation is most consistent with acute presentation with potential threat to life or bodily function. Workup: ECG, CXR, CBC, BMP, Troponin Findings: ECG: No overt evidence of STEMI. No evidence of Brugadas sign, delta wave, epsilon wave, significantly prolonged QTc, or malignant arrhythmia HS Troponin: Negative x1 Other Labs unremarkable for emergent problems. CXR: Without PTX, PNA, or widened mediastinum Last Stress Test:  2022 Last Heart Catheterization:  2023 HEART Score: 5  Given History, Exam, and Workup I have low suspicion for ACS, Pneumothorax, Pneumonia, Pulmonary Embolus, Tamponade, Aortic Dissection or other emergent problem as a cause for this presentation.   Reassesment: Prior to discharge patients pain was controlled and they were well appearing.  Disposition:  Discharge. Strict return precautions discussed with patient with full understanding. Advised patient to follow up promptly with primary care provider    FINAL CLINICAL IMPRESSION(S) / ED DIAGNOSES   Final diagnoses:  Lightheadedness  Chest pain, unspecified type  Acute pulmonary edema (HCC)   Rx / DC Orders   ED Discharge Orders          Ordered    Ambulatory referral to Cardiology       Comments: If you have not heard from the Cardiology office  within the next 72 hours please call [phone].   12/21/22 1349           Note:  This document was prepared using Dragon voice recognition software and may include unintentional dictation errors.   Merwyn Katos, MD 12/22/22 [phone]

## 2022-12-21 NOTE — Discharge Instructions (Addendum)
Please reduce your daily fluid intake to 50 ounces per day prior to follow-up with your cardiologist.

## 2022-12-21 NOTE — ED Triage Notes (Signed)
Patient c/o chest pain and lightheadedness.

## 2022-12-23 NOTE — Progress Notes (Unsigned)
Patient ID: Gabriel [name]., male   DOB: 08-27-1970, 52 y.o.   MRN: [id number] Cardiology Office Note  Date:  12/24/2022   ID:  Gabriel [name]., DOB 1970-12-17, MRN [id number]  PCP:  Jacky Kindle, FNP   Chief Complaint  Patient presents with   chest pain & lightheaded     Patient c/o chest pressure now, shortness of breath with little to no exertion, dizziness & feeling like could black out. Medications reviewed by the patient verbally.     HPI:  52 y.o. male with h/o  nonischemic cardiomyopathy by nuclear stress test in 2016, OSA, did not tolerate CPAP History of medication noncompliance History of chronic chest pain [facility] on 01/03/16 with cough and SOB, septic shock, right upper lobe pneumonia,  Left heart cath 01/06/2017  left ventricular ejection fraction  25-35% by visual estimate. chronic systolic CHF, Follow-up echocardiograms ejection fraction 30-35%, up to 45% - 50% with medical management COPD, long smoking history Prior runs of nonsustained VT, syncope DM2, med noncompliance, Morbid obesity Cath 05/2020: occluded RCA with collaterals He presents today for follow-up of his nonischemic cardiomyopathy, chronic systolic CHF, weakness, dehydration  Last seen by myself August 2022 Last seen by EP January 2024 Followed by advanced heart failure clinic  admitted in 2/23 and 4/23 with HF.  He responded well to IV diuresis and at his request, he was discharged home on April 12 torsemide 40 mg twice daily (previous outpatient dose of 60 mg daily).    He presented to the ED 6/1 with blood glucose elevated @ 534.  Troponin stable at 50.  Other notable labs: K 2.7, Creat up from baseline @ 2.42.  He was treated w/ insulin, IVF, and KCl.     Last echo 02/06/22: EF 30-35%    Underwent SQ ICD placement 05/20/22   Has been following with PCP and A1c down to 7.5 in 9/23   Saw GI and planning EGD/colon. Has h/o cirrhosis from NASH   Echo 08/14/22 EF 40-45% RV ok   Uses  Furoscix about once every 2-3 months for swelling.    H/o CAD w/ last cath in 05/2020 revealing an occluded RCA w/ otw nonobs dzs and L  R collats.   Went to the ER Saturday, was dizzy Cardiac enzymes negative, BNP low was discharged  Reports he continues to have dizziness, spinning, will have symptoms when sitting or laying down Also reports having chest pain but states it is chronic, no change from his usual chest pain Reports chronic abdominal pain  Reports compliance with his medications   EKG personally reviewed by myself on todays visit Sinus tachycardia rate 100 bpm no significant ST-T wave changes  PMH:   has a past medical history of CAD (coronary artery disease), Chest wall pain, chronic, Chronic Troponin Elevation, CKD (chronic kidney disease), stage II-III, COPD (chronic obstructive pulmonary disease) (HCC), Diabetes mellitus without complication (HCC), HFrEF (heart failure with reduced ejection fraction) (HCC), Hypertension, Mitral regurgitation, Mixed Ischemic & NICM (nonischemic cardiomyopathy) (HCC), Myocardial infarct (HCC), NSVT (nonsustained ventricular tachycardia) (HCC), Obesity (BMI 30.0-34.9), Psoriasis, Recurrent pulmonary emboli (HCC) (06/07/2020), and Syncope.  PSH:    Past Surgical History:  Procedure Laterality Date   AMPUTATION     CARDIAC CATHETERIZATION     COLONOSCOPY WITH PROPOFOL N/A 08/01/2022   Procedure: COLONOSCOPY WITH PROPOFOL;  Surgeon: Toney Reil, MD;  Location: [facility] ENDOSCOPY;  Service: Gastroenterology;  Laterality: N/A;   ESOPHAGOGASTRODUODENOSCOPY (EGD) WITH PROPOFOL N/A 08/01/2022  Procedure: ESOPHAGOGASTRODUODENOSCOPY (EGD) WITH PROPOFOL;  Surgeon: Toney Reil, MD;  Location: [facility] ENDOSCOPY;  Service: Gastroenterology;  Laterality: N/A;   ESOPHAGOGASTRODUODENOSCOPY (EGD) WITH PROPOFOL N/A 10/16/2022   Procedure: ESOPHAGOGASTRODUODENOSCOPY (EGD) WITH PROPOFOL;  Surgeon: Toney Reil, MD;  Location: [facility] ENDOSCOPY;   Service: Gastroenterology;  Laterality: N/A;   FINGER AMPUTATION     Traumatic   FINGER FRACTURE SURGERY Left    FRACTURE SURGERY     LEFT HEART CATH AND CORONARY ANGIOGRAPHY N/A 01/06/2017   Procedure: Left Heart Cath and Coronary Angiography;  Surgeon: Iran Ouch, MD;  Location: [facility] INVASIVE CV LAB;  Service: Cardiovascular;  Laterality: N/A;   RIGHT/LEFT HEART CATH AND CORONARY ANGIOGRAPHY N/A 06/13/2020   Procedure: RIGHT/LEFT HEART CATH AND CORONARY ANGIOGRAPHY;  Surgeon: Yvonne Kendall, MD;  Location: [facility] INVASIVE CV LAB;  Service: Cardiovascular;  Laterality: N/A;   SUBQ ICD IMPLANT N/A 05/20/2022   Procedure: SUBQ ICD IMPLANT;  Surgeon: Lanier Prude, MD;  Location: [facility] INVASIVE CV LAB;  Service: Cardiovascular;  Laterality: N/A;    Current Outpatient Medications  Medication Sig Dispense Refill   albuterol (VENTOLIN HFA) 108 (90 Base) MCG/ACT inhaler Inhale 2 puffs into the lungs every 4 (four) hours as needed for wheezing or shortness of breath. 8 g 6   allopurinol (ZYLOPRIM) 100 MG tablet Take 1 tablet (100 mg total) by mouth daily. 30 tablet 6   azelastine (ASTELIN) 0.1 % nasal spray Place 2 sprays into both nostrils 2 (two) times daily. Use in each nostril as directed 30 mL 12   blood glucose meter kit and supplies KIT Dispense based on patient and insurance preference. Use up to four times daily as directed. 1 each 11   carvedilol (COREG) 3.125 MG tablet TAKE 1 TABLET BY MOUTH TWICE DAILY WITH MEALS 180 tablet 0   cetirizine (ZYRTEC) 10 MG tablet Take 1 tablet (10 mg total) by mouth daily. 30 tablet 11   colchicine 0.6 MG tablet Day 1: Take 2 tablets PO for gout flare, repeat 1 tablet one hour following initial dose. Day 2: Take 1 tablet twice daily PO for an additional 2-3 days. 60 tablet 0   Continuous Blood Gluc Sensor (FREESTYLE LIBRE 3 SENSOR) MISC Place 1 sensor on the skin every 14 days. Use to check glucose continuously 6 each 3   dapagliflozin propanediol  (FARXIGA) 10 MG TABS tablet Take 1 tablet (10 mg total) by mouth daily. 30 tablet 5   fenofibrate (TRICOR) 145 MG tablet Take 1 tablet (145 mg total) by mouth daily. 90 tablet 3   FLUoxetine (PROZAC) 20 MG capsule Take 1 capsule (20 mg total) by mouth daily. 90 capsule 3   fluticasone (FLONASE) 50 MCG/ACT nasal spray Place 2 sprays into both nostrils daily. 16 g 6   Furosemide (FUROSCIX) 80 MG/10ML CTKT Inject 1 Dose into the skin as needed (edema). 6 each 0   icosapent Ethyl (VASCEPA) 1 g capsule Take 2 capsules (2 g total) by mouth 2 (two) times daily. 360 capsule 1   insulin aspart (NOVOLOG) 100 UNIT/ML FlexPen Inject 6 units + correction before breakfast/lunch and 9 units + correction before supper (correction = 1 unit per 50 mg/dL above 161); max daily dose 36 units (Patient taking differently: Inject 10 units + correction before breakfast/lunch and supper (correction = 1 unit per 50 mg/dL above 096); max daily dose 36 units) 15 mL 3   Insulin Glargine (BASAGLAR KWIKPEN) 100 UNIT/ML Inject 36 Units into the skin at bedtime.  30 mL 3   Insulin Pen Needle (PEN NEEDLES) 32G X 4 MM MISC 200 each by Does not apply route at bedtime. 200 each 3   metoCLOPramide (REGLAN) 5 MG tablet Take 1 tablet (5 mg total) by mouth 4 (four) times daily -  before meals and at bedtime for 14 days. 56 tablet 0   nitroGLYCERIN (NITROSTAT) 0.4 MG SL tablet Place 1 tablet (0.4 mg total) under the tongue every 5 (five) minutes x 3 doses as needed for chest pain. 30 tablet 0   omeprazole (PRILOSEC) 40 MG capsule Take 1 capsule (40 mg total) by mouth daily. 30 capsule 4   potassium chloride SA (KLOR-CON M) 20 MEQ tablet Take 1 tablet (20 mEq total) by mouth every other day. 15 tablet 6   rivaroxaban (XARELTO) 20 MG TABS tablet Take 1 tablet (20 mg total) by mouth daily with supper. 90 tablet 3   rosuvastatin (CRESTOR) 40 MG tablet Take 1 tablet (40 mg total) by mouth daily. 90 tablet 3   sacubitril-valsartan (ENTRESTO) 97-103  MG Take 1 tablet by mouth 2 (two) times daily. 60 tablet 6   Semaglutide, 2 MG/DOSE, 8 MG/3ML SOPN Inject 2 mg as directed once a week. 9 mL 1   spironolactone (ALDACTONE) 25 MG tablet Take 1 tablet (25 mg total) by mouth daily. 90 tablet 3   torsemide (DEMADEX) 20 MG tablet Take 2 tablets (40 mg total) by mouth daily. Take an extra 20 mg tablet daily as needed for edema 75 tablet 6   No current facility-administered medications for this visit.    Allergies:   Metformin and related and Zantac [ranitidine hcl]   Social History:  The patient  reports that he has been smoking Cigarettes.  He has been smoking about 2.00 packs per day for the past 33 years. He has never used smokeless tobacco. He reports that he drinks about 1.8 oz of alcohol per week. He reports that he does not use illicit drugs.   Family History:   family history includes Cancer in his maternal grandfather and paternal aunt; Diabetes in his maternal grandfather and mother; Diabetes Mellitus II in his mother; Gout in his father; Heart attack in his mother; Hypertension in his father and mother; Hypothyroidism in his mother; Kidney failure in his mother.    Review of Systems: Review of Systems  Constitutional: Negative.        Abdominal distention  HENT: Negative.    Respiratory:  Positive for shortness of breath.   Cardiovascular: Negative.   Gastrointestinal: Negative.   Musculoskeletal:  Positive for back pain.  Psychiatric/Behavioral: Negative.    All other systems reviewed and are negative.   PHYSICAL EXAM: VS:  BP 124/87 (BP Location: Left Arm, Patient Position: Sitting, Cuff Size: Normal)   Pulse 100   Ht 5\' 11"  (1.803 m)   Wt 245 lb (111.1 kg)   SpO2 97%   BMI 34.17 kg/m  , BMI Body mass index is 34.17 kg/m. Constitutional:  oriented to person, place, and time. No distress.  HENT:  Head: Grossly normal Eyes:  no discharge. No scleral icterus.  Neck: No JVD, no carotid bruits  Cardiovascular: Regular rate  and rhythm, no murmurs appreciated Pulmonary/Chest: Clear to auscultation bilaterally, no wheezes or rails Abdominal: Soft.  no distension.  no tenderness.  Musculoskeletal: Normal range of motion Neurological:  normal muscle tone. Coordination normal. No atrophy Skin: Skin warm and dry Psychiatric: normal affect, pleasant   Recent Labs: 01/17/2022: Magnesium 2.4  12/21/2022: ALT 30; B Natriuretic Peptide 34.0; BUN 24; Creatinine, Ser 1.62; Hemoglobin 18.2; Platelets 209; Potassium 3.9; Sodium 139    Lipid Panel Lab Results  Component Value Date   CHOL 143 08/09/2022   HDL 24 (L) 08/09/2022   LDLCALC 62 08/09/2022   TRIG 367 (H) 08/09/2022      Wt Readings from Last 3 Encounters:  12/24/22 245 lb (111.1 kg)  12/21/22 245 lb (111.1 kg)  12/19/22 247 lb (112 kg)     ASSESSMENT AND PLAN:  Dizziness Main symptom today, concerning for vertigo Room is spinning Recommended he try meclizine 25 mg 3 times daily as needed If symptoms persist, may need referral to ENT  Cardiomyopathy (HCC) -Dilated, nonischemic Prior history of medication noncompliance, now reports compliance with his medications Taking torsemide 60 alternating with 40 BNP low Chronic atypical chest pain in the setting of known coronary disease Occluded RCA with collaterals from left to right No further workup at this time  Essential hypertension Blood pressure is well controlled on today's visit. No changes made to the medications.  Chronic systolic heart failure (HCC) Euvolemic, continue Coreg 3.125 twice daily, Farxiga, torsemide 60 alternating with 40, Vascepa, spironolactone, Entresto  coronary artery disease without angina RCA occlusion with collaterals from left to right October 2021 Reports history of chronic chest pain, symptoms today no different from his prior chronic symptoms  Morbid obesity We have encouraged continued exercise, careful diet management in an effort to lose weight. Weight stable  if not trending down on Ozempic   Total encounter time more than 40 minutes  Greater than 50% was spent in counseling and coordination of care with the patient     No orders of the defined types were placed in this encounter.    Signed, Dossie Arbour, M.D., Ph.D. 12/24/2022  [facility] [place], [place] [phone]

## 2022-12-24 ENCOUNTER — Encounter: Payer: Self-pay | Admitting: Cardiovascular Disease

## 2022-12-24 ENCOUNTER — Ambulatory Visit: Payer: Medicaid Other | Attending: Cardiovascular Disease | Admitting: Cardiovascular Disease

## 2022-12-24 VITALS — BP 124/87 | HR 100 | Ht 71.0 in | Wt 245.0 lb

## 2022-12-24 DIAGNOSIS — I10 Essential (primary) hypertension: Secondary | ICD-10-CM | POA: Diagnosis not present

## 2022-12-24 DIAGNOSIS — Z95810 Presence of automatic (implantable) cardiac defibrillator: Secondary | ICD-10-CM | POA: Diagnosis not present

## 2022-12-24 DIAGNOSIS — I428 Other cardiomyopathies: Secondary | ICD-10-CM

## 2022-12-24 DIAGNOSIS — I5022 Chronic systolic (congestive) heart failure: Secondary | ICD-10-CM

## 2022-12-24 DIAGNOSIS — N1832 Chronic kidney disease, stage 3b: Secondary | ICD-10-CM

## 2022-12-24 DIAGNOSIS — Z794 Long term (current) use of insulin: Secondary | ICD-10-CM

## 2022-12-24 DIAGNOSIS — E1122 Type 2 diabetes mellitus with diabetic chronic kidney disease: Secondary | ICD-10-CM

## 2022-12-24 DIAGNOSIS — I2510 Atherosclerotic heart disease of native coronary artery without angina pectoris: Secondary | ICD-10-CM

## 2022-12-24 DIAGNOSIS — J449 Chronic obstructive pulmonary disease, unspecified: Secondary | ICD-10-CM

## 2022-12-24 DIAGNOSIS — N1831 Chronic kidney disease, stage 3a: Secondary | ICD-10-CM | POA: Diagnosis not present

## 2022-12-24 DIAGNOSIS — G4733 Obstructive sleep apnea (adult) (pediatric): Secondary | ICD-10-CM

## 2022-12-24 MED ORDER — MECLIZINE HCL 25 MG PO TABS: 25.0000 mg | ORAL_TABLET | Freq: Three times a day (TID) | ORAL | 1 refills | Status: DC | PRN

## 2022-12-24 NOTE — Patient Instructions (Addendum)
Medication Instructions:  Meclizine 25 mg up to three times a day as needed for vertigo  If you need a refill on your cardiac medications before your next appointment, please call your pharmacy.   Lab work: No new labs needed  Testing/Procedures: No new testing needed  Follow-Up: At [facility], you and your health needs are our priority.  As part of our continuing mission to provide you with exceptional heart care, we have created designated Provider Care Teams.  These Care Teams include your primary Cardiologist (physician) and Advanced Practice Providers (APPs -  Physician Assistants and Nurse Practitioners) who all work together to provide you with the care you need, when you need it.  You will need a follow up appointment in  6 months  Providers on your designated Care Team:   Nicolasa Ducking, NP Eula Listen, PA-C Cadence Fransico Michael, [place]  COVID-19 Vaccine Information can be found at: [email or web address] For questions related to vaccine distribution or appointments, please email [email or web address] or call [phone].

## 2022-12-26 NOTE — Addendum Note (Signed)
Addended by: Ihor Dow on: 12/26/2022 09:49 AM   Modules accepted: Orders

## 2022-12-29 NOTE — Progress Notes (Unsigned)
PCP: Merita Norton, FNP (last seen 12/19/22) Primary Cardiologist: Julien Nordmann, MD (last seen 12/24/22) HF cardiologist: Arvilla Meres, MD (last seen 01/24)  HPI:     [name] is a 52 y/o male with a history of HTN, diabetes, COPD, CKD, asthma, previous tobacco use and chronic heart failure.    Echo 08/14/22: EF 40-45% with mild LVH, Grade I DD, severe hypokinesis of left ventricular, entire inferior and inferolateral wall. Echo 02/06/22: EF of 30-35% along with mild/moderate AR. Echo 11/24/21: EF of 20-25% along with mild LVH and mild MR. Echo 05/21/21: EF of 40-45% along with mild LVH and mild MR. Echo 05/21/21: EF of 40-45% along with mild LVH and mild MR. Echo 06/08/20: EF of 35-40% along with mildly elevated PA pressure of 44.7 mmHg, moderate LAE and mild/moderate MR. Echo 11/23/19: EF of 25-30% with mild LVH and moderately elevated PA Pressure. Echo 06/26/2019: EF of 30-35% along with moderately elevated PA pressure. Echo 04/29/17: EF 45-50%. Echo 11/28/16: EF of 40-45% along with mild MR. EF has improved from 30-35% July 2017.   RHC/LHC done 06/13/20 and showed: Severe single-vessel coronary artery disease with chronic total occlusion of the proximal RCA.  The distal vessel fills via left to right collaterals. Mild to moderate, nonobstructive CAD involving the LAD and LCx with up to 50% stenosis. Mildly elevated left and right heart filling pressures. Moderate pulmonary hypertension (mean PAP 40 mmHg) with significantly elevated pulmonary vascular resistance (PVR 7.4 WU). Severely reduced Fick cardiac output/index.   Was in the ED 12/21/22 due to dizziness & chest pain. Admitted 05/20/22 for ICD placement and discharged the next day.   He presents today for a HF follow-up visit with a chief complaint of    Since last here he    ROS: All systems negative except as listed in HPI, PMH and Problem List.  SH:  Social History   Socioeconomic History   Marital status: Widowed    Spouse name:  Not on file   Number of children: 1   Years of education: 39   Highest education level: 12th grade  Occupational History   Occupation: disabled  Tobacco Use   Smoking status: Former    Packs/day: 0.50    Years: 33.00    Additional pack years: 0.00    Total pack years: 16.50    Types: Cigarettes    Quit date: 05/08/2020    Years since quitting: 2.6   Smokeless tobacco: Former    Quit date: 05/08/2020   Tobacco comments:    Quit Sept 2021  Vaping Use   Vaping Use: Former  Substance and Sexual Activity   Alcohol use: Not Currently    Comment: occassionally   Drug use: No   Sexual activity: Not Currently  Other Topics Concern   Not on file  Social History Narrative   Lives at home with wife and his dad's wife.        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    Social Determinants of Health   Financial Resource Strain: Medium Risk (08/05/2022)   Overall Financial Resource Strain (CARDIA)    Difficulty of Paying Living Expenses: Somewhat hard  Food Insecurity: No Food Insecurity (08/15/2022)   Hunger Vital Sign    Worried About Running Out of Food in the Last Year: Never true    Ran Out of Food in the Last Year: Never true  Transportation Needs: Unmet Transportation Needs (08/15/2022)  PRAPARE - Administrator, Civil Service (Medical): Yes    Lack of Transportation (Non-Medical): Yes  Physical Activity: Insufficiently Active (01/20/2018)   Exercise Vital Sign    Days of Exercise per Week: 7 days    Minutes of Exercise per Session: 10 min  Stress: Stress Concern Present (09/02/2017)   [name] of Occupational Health - Occupational Stress Questionnaire    Feeling of Stress : Very much  Social Connections: Moderately Isolated (09/02/2017)   Social Connection and Isolation Panel [NHANES]    Frequency of Communication with Friends and Family: Twice a week    Frequency of Social Gatherings with  Friends and Family: Once a week    Attends Religious Services: Never    Database administrator or Organizations: No    Attends Banker Meetings: Never    Marital Status: Widowed  Intimate Partner Violence: Not At Risk (05/14/2022)   Humiliation, Afraid, Rape, and Kick questionnaire    Fear of Current or Ex-Partner: No    Emotionally Abused: No    Physically Abused: No    Sexually Abused: No    FH:  Family History  Problem Relation Age of Onset   Diabetes Mother    Diabetes Mellitus II Mother    Hypothyroidism Mother    Hypertension Mother    Kidney failure Mother        Dialysis   Heart attack Mother        25 yo approximately   Hypertension Father    Gout Father    Cancer Maternal Grandfather    Diabetes Maternal Grandfather    Cancer Paternal Aunt     Past Medical History:  Diagnosis Date   CAD (coronary artery disease)    a. 04/2015 low risk MV;  b. 12/2016 Cath: minor irregs in LAD/Diag/LCX/OM, RCA 40p/m/d; c. 05/2020 Cath: LM nl, LAD 50d, LCX 30p, OM1 40, RCA 100p w/ L->R collats. CO/CI 3.1/1.3-->Med rx.   Chest wall pain, chronic    Chronic Troponin Elevation    CKD (chronic kidney disease), stage II-III    COPD (chronic obstructive pulmonary disease) (HCC)    Diabetes mellitus without complication (HCC)    HFrEF (heart failure with reduced ejection fraction) (HCC)    a. 03/2015 Echo: EF 45-50%; b. 12/2015 Echo: EF 20-25%; c. 02/2016 Echo: EF 30-35%; d. 11/2016 Echo: EF 40-45%; e. 06/2019 Echo: EF 30-35%; f. 11/2019 Echo: EF 25-30%; g. 05/2020 Echo: EF 35-40%; h. 05/2021 Echo: EF 40-45%; i. 11/2021 Echo: EF 20-25%, glob HK, mild LVH, GrIII DD, sev red RV fxn, mild MR.   Hypertension    Mitral regurgitation    Mild to moderate by October 2021 echocardiogram.   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 03/2015 Echo: EF 45-50%; b. 12/2015 Echo: EF 20-25%; c. 02/2016 Echo: EF 30-35%; d. 11/2016 Echo: EF 40-45%; e. 06/2019 Echo: EF 30-35%; f. 11/2019  Echo: EF 25-30%; g. 05/2020 Echo: EF 35-40%; h. 05/2021 Echo: EF 40-45%; i. 11/2021 Echo: EF 20-25%   Myocardial infarct (HCC)    NSVT (nonsustained ventricular tachycardia) (HCC)    a. 12/2015 noted on tele-->amio;  b. 12/2015 Event monitor: no VT noted.   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  12/31/19: RUL and RLL PEs.   Syncope    a. 01/2016 - felt to be vasovagal.        PHYSICAL  EXAM:  General:  Well appearing. No resp difficulty HEENT: normal Neck: supple. JVP flat. Carotids 2+ bilaterally; no bruits. No lymphadenopathy or thryomegaly appreciated. Cor: PMI normal. Regular rate & rhythm. No rubs, gallops or murmurs. Lungs: clear Abdomen: soft, nontender, nondistended. No hepatosplenomegaly. No bruits or masses. Good bowel sounds. Extremities: no cyanosis, clubbing, rash, edema Neuro: alert & orientedx3, cranial nerves grossly intact. Moves all 4 extremities w/o difficulty. Affect pleasant.   ECG:   ASSESSMENT & PLAN:  1.  Chronic HFrEF/Mixed Ischemic and nonischemic cardiomyopathy:   - Mixed ICM/NICM - Stable NYHA III - s/p [place] Sci Sq-ICD  - weighing daily; reminded to call for an overnight weight gain of > 2 pounds or a weekly weight gain of > 5 pounds - weight 248 pounds from last visit here 2 months ago - Echo 08/14/22: EF 40-45% with mild LVH, Grade I DD, severe hypokinesis of left ventricular, entire inferior and inferolateral wall.  - Echo 02/06/22: EF of 30-35% along with mild/moderate AR.  - Echo 11/24/21: EF of 20-25% along with mild LVH and mild MR.  - Echo 05/21/21: EF of 40-45% along with mild LVH and mild MR. Echo 05/21/21: EF of 40-45% along with mild LVH and mild MR.  - Echo 06/08/20: EF of 35-40% along with mildly elevated PA pressure of 44.7 mmHg, moderate LAE and mild/moderate MR. - Continue torsemide 60 daily. Continue to use Furoscix if weight >= 250  - continue Entresto to  97/103 bid - continue Farxiga 10 - continue spiro 25 daily - continue carvedilol 3.125 bid - consider Cardiomems to help volume management as needed -> his insurance won't cover - saw EP Lalla Brothers) 01/24 - ICD placed on 05/20/22 - saw ADHF provider (Bensimhon) 01/24 - BNP 12/21/22 was 34.0   2.  CAD/Elevated Troponin and chronic CP:  - H/o CAD  RHC/LHC done 10/21 and showed:  Severe single-vessel coronary artery disease with chronic total occlusion of the proximal RCA.  The distal vessel fills via left to right collaterals.  Mild to moderate, nonobstructive CAD involving the LAD and LCx with up to 50% stenosis.  Mildly elevated left and right heart filling pressures.  Moderate pulmonary hypertension (mean PAP 40 mmHg) with significantly elevated pulmonary vascular resistance (PVR 7.4 WU).  Severely reduced Fick cardiac output/index. - Stable angina - Continue ASA/statin - saw cardiology (Gollan) 12/24/22   3.  HTN:  - BP - saw PCP Suzie Portela) 12/19/22 - BMP 12/21/22 showed sodium 139, potassium 3.9, creatinine 1.62 and GFR 50   4.  H/o PE/Chronic anticoagulation:  -continue xarelto.  - No recent bleeding   5.  DM2- - glucose  - saw nephrology (Korrapati) 08/23  - A1c 07/22/22 was 7.1%   6. OSA - not on CPAP due to lack of insurance. Now on Medicaid - Sleep study 3/22: AHI 28  - will repeat sleep study (pending)  7: COPD- - stopped smoking 2021 - had smoked for close to 40 years with starting at the age of 89 - at his peak, he was smoking 2-2.5 ppd - saw pulmonology (Dgayli) 03/24

## 2022-12-30 ENCOUNTER — Ambulatory Visit: Payer: Medicaid Other | Attending: Family | Admitting: Family

## 2022-12-30 ENCOUNTER — Encounter: Payer: Self-pay | Admitting: Family

## 2022-12-30 VITALS — BP 141/78 | HR 82 | Wt 246.6 lb

## 2022-12-30 DIAGNOSIS — G4733 Obstructive sleep apnea (adult) (pediatric): Secondary | ICD-10-CM

## 2022-12-30 DIAGNOSIS — Z86711 Personal history of pulmonary embolism: Secondary | ICD-10-CM | POA: Insufficient documentation

## 2022-12-30 DIAGNOSIS — I25118 Atherosclerotic heart disease of native coronary artery with other forms of angina pectoris: Secondary | ICD-10-CM | POA: Insufficient documentation

## 2022-12-30 DIAGNOSIS — I130 Hypertensive heart and chronic kidney disease with heart failure and stage 1 through stage 4 chronic kidney disease, or unspecified chronic kidney disease: Secondary | ICD-10-CM | POA: Diagnosis not present

## 2022-12-30 DIAGNOSIS — I2510 Atherosclerotic heart disease of native coronary artery without angina pectoris: Secondary | ICD-10-CM | POA: Diagnosis not present

## 2022-12-30 DIAGNOSIS — N189 Chronic kidney disease, unspecified: Secondary | ICD-10-CM | POA: Insufficient documentation

## 2022-12-30 DIAGNOSIS — Z8249 Family history of ischemic heart disease and other diseases of the circulatory system: Secondary | ICD-10-CM | POA: Diagnosis not present

## 2022-12-30 DIAGNOSIS — J4489 Other specified chronic obstructive pulmonary disease: Secondary | ICD-10-CM | POA: Diagnosis not present

## 2022-12-30 DIAGNOSIS — Z5982 Transportation insecurity: Secondary | ICD-10-CM | POA: Insufficient documentation

## 2022-12-30 DIAGNOSIS — Z833 Family history of diabetes mellitus: Secondary | ICD-10-CM | POA: Insufficient documentation

## 2022-12-30 DIAGNOSIS — E1122 Type 2 diabetes mellitus with diabetic chronic kidney disease: Secondary | ICD-10-CM

## 2022-12-30 DIAGNOSIS — I5022 Chronic systolic (congestive) heart failure: Secondary | ICD-10-CM

## 2022-12-30 DIAGNOSIS — I272 Pulmonary hypertension, unspecified: Secondary | ICD-10-CM | POA: Diagnosis not present

## 2022-12-30 DIAGNOSIS — Z7901 Long term (current) use of anticoagulants: Secondary | ICD-10-CM | POA: Diagnosis not present

## 2022-12-30 DIAGNOSIS — I10 Essential (primary) hypertension: Secondary | ICD-10-CM | POA: Diagnosis not present

## 2022-12-30 DIAGNOSIS — R42 Dizziness and giddiness: Secondary | ICD-10-CM | POA: Diagnosis not present

## 2022-12-30 DIAGNOSIS — J449 Chronic obstructive pulmonary disease, unspecified: Secondary | ICD-10-CM

## 2022-12-30 DIAGNOSIS — Z95810 Presence of automatic (implantable) cardiac defibrillator: Secondary | ICD-10-CM | POA: Diagnosis not present

## 2022-12-30 DIAGNOSIS — I255 Ischemic cardiomyopathy: Secondary | ICD-10-CM | POA: Insufficient documentation

## 2022-12-30 DIAGNOSIS — Z87891 Personal history of nicotine dependence: Secondary | ICD-10-CM | POA: Insufficient documentation

## 2022-12-30 DIAGNOSIS — I428 Other cardiomyopathies: Secondary | ICD-10-CM | POA: Diagnosis not present

## 2022-12-30 DIAGNOSIS — Z794 Long term (current) use of insulin: Secondary | ICD-10-CM | POA: Diagnosis not present

## 2022-12-30 DIAGNOSIS — Z5986 Financial insecurity: Secondary | ICD-10-CM | POA: Insufficient documentation

## 2022-12-30 DIAGNOSIS — Z7984 Long term (current) use of oral hypoglycemic drugs: Secondary | ICD-10-CM | POA: Insufficient documentation

## 2022-12-30 DIAGNOSIS — N1832 Chronic kidney disease, stage 3b: Secondary | ICD-10-CM

## 2022-12-30 DIAGNOSIS — K7460 Unspecified cirrhosis of liver: Secondary | ICD-10-CM | POA: Diagnosis not present

## 2022-12-30 DIAGNOSIS — I2699 Other pulmonary embolism without acute cor pulmonale: Secondary | ICD-10-CM | POA: Diagnosis not present

## 2022-12-30 NOTE — Progress Notes (Signed)
[facility] FAIL[facility]IN[facility] - Pharmacist Education Note  Gabriel [name]. is a 52 y.o. male with HFimpEF (baseline EF <40% with a follow-up EF >40%) presenting to the Heart Failure Clinic for follow up. Patient reports no issues on current regimen and no barriers to medication access. He checks his weight at least every other day and reports that it is stable around 245-236 lbs. He reports some changes in his appetite since starting semaglutide but reports that they are tolerable.   Patient voiced some concerns about insurance coverage with his current pharmacy. He states that he has not has problems so far, but he is concerned that his change in insurance may cause affordability problems in the future. Patient was instructed to reach out to the clinic if he encounters any such problems.   Recent ED Visit (past 6 months):  Date: 12/21/2022, CC: chest pain and dizziness  Date: 07/25/2022, CC: dizziness and weakness  Guideline-Directed Medical Therapy/Evidence Based Medicine ACE/ARB/ARNI: Sacubitril/valsartan 97/103 mg BID Beta Blocker: Carvedilol 3.125 mg twice daily Aldosterone Antagonist: Spironolactone 25 mg daily Diuretic: Torsemide 60 mg and 40 mg on alternating days SGLT2i: Dapagliflozin 10 mg daily  Adherence Assessment Do you ever forget to take your medication? [] Yes [x] No  Do you ever skip doses due to side effects? [] Yes [x] No  Do you have trouble affording your medicines? [] Yes [x] No  Are you ever unable to pick up your medication due to transportation difficulties? [] Yes [x] No  Do you ever stop taking your medications because you don't believe they are helping? [] Yes [x] No  Do you check your weight daily? [x] Yes [] No  Adherence strategy: pill box Barriers to obtaining medications: none reported  Diagnostics ECHO: Date 08/14/2022, EF 40-45%, RWMA, mild LVH, G1DD  Vitals    12/30/2022    8:21 AM 12/24/2022    4:14 PM 12/21/2022    1:35 PM  Vitals with BMI  Height  5\' 11"     Weight 246 lbs 10 oz 245 lbs   BMI 34.41 34.19   Systolic 141 124 696  Diastolic 78 87 69  Pulse 82 100 73     Recent Labs    Latest Ref Rng & Units 12/21/2022    8:59 AM 11/11/2022    1:21 PM 10/29/2022   11:15 AM  BMP  Glucose 70 - 99 mg/dL 295  284  132   BUN 6 - 20 mg/dL 24  30  26    Creatinine 0.61 - 1.24 mg/dL 4.40  1.02  7.25   Sodium 135 - 145 mmol/L 139  141  139   Potassium 3.5 - 5.1 mmol/L 3.9  4.0  3.8   Chloride 98 - 111 mmol/L 104  103  104   CO2 22 - 32 mmol/L 24  27  24    Calcium 8.9 - 10.3 mg/dL 9.5  9.8  9.5     Past Medical History Past Medical History:  Diagnosis Date   CAD (coronary artery disease)    a. 04/2015 low risk MV;  b. 12/2016 Cath: minor irregs in LAD/Diag/LCX/OM, RCA 40p/m/d; c. 05/2020 Cath: LM nl, LAD 50d, LCX 30p, OM1 40, RCA 100p w/ L->R collats. CO/CI 3.1/1.3-->Med rx.   Chest wall pain, chronic    Chronic Troponin Elevation    CKD (chronic kidney disease), stage II-III    COPD (chronic obstructive pulmonary disease) (HCC)    Diabetes mellitus without complication (HCC)    HFrEF (heart failure with reduced ejection fraction) (HCC)    a. 03/2015  Echo: EF 45-50%; b. 12/2015 Echo: EF 20-25%; c. 02/2016 Echo: EF 30-35%; d. 11/2016 Echo: EF 40-45%; e. 06/2019 Echo: EF 30-35%; f. 11/2019 Echo: EF 25-30%; g. 05/2020 Echo: EF 35-40%; h. 05/2021 Echo: EF 40-45%; i. 11/2021 Echo: EF 20-25%, glob HK, mild LVH, GrIII DD, sev red RV fxn, mild MR.   Hypertension    Mitral regurgitation    Mild to moderate by October 2021 echocardiogram.   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 03/2015 Echo: EF 45-50%; b. 12/2015 Echo: EF 20-25%; c. 02/2016 Echo: EF 30-35%; d. 11/2016 Echo: EF 40-45%; e. 06/2019 Echo: EF 30-35%; f. 11/2019 Echo: EF 25-30%; g. 05/2020 Echo: EF 35-40%; h. 05/2021 Echo: EF 40-45%; i. 11/2021 Echo: EF 20-25%   Myocardial infarct (HCC)    NSVT (nonsustained ventricular tachycardia) (HCC)    a. 12/2015 noted on tele-->amio;  b.  12/2015 Event monitor: no VT noted.   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  12/31/19: RUL and RLL PEs.   Syncope    a. 01/2016 - felt to be vasovagal.    Plan CHF Continue current regimen as directed by NP Consider titrating carvedilol to 6.25 mg BID as tolerated Contact clinic if experiencing issues with medication affordability  Time spent: 10 minutes  Celene Squibb, PharmD PGY1 Pharmacy Resident 12/30/2022 8:42 AM

## 2022-12-30 NOTE — Patient Instructions (Addendum)
[facility] [address] [place] [place], [place] [address]  Phone: [phone]    Increase your carvedilol to 2 tablets every morning and 2 tablets every evening

## 2022-12-31 NOTE — Progress Notes (Signed)
Remote ICD transmission.   

## 2023-01-17 ENCOUNTER — Ambulatory Visit (HOSPITAL_BASED_OUTPATIENT_CLINIC_OR_DEPARTMENT_OTHER): Payer: Medicaid Other | Admitting: Family

## 2023-01-17 ENCOUNTER — Other Ambulatory Visit
Admission: RE | Admit: 2023-01-17 | Discharge: 2023-01-17 | Disposition: A | Discharge status: Home / Self Care | Payer: Medicaid Other | Source: Ambulatory Visit | Attending: Family | Admitting: Family

## 2023-01-17 ENCOUNTER — Encounter: Payer: Self-pay | Admitting: Family

## 2023-01-17 VITALS — BP 116/64 | HR 78 | Wt 247.2 lb

## 2023-01-17 DIAGNOSIS — Z794 Long term (current) use of insulin: Secondary | ICD-10-CM | POA: Diagnosis not present

## 2023-01-17 DIAGNOSIS — R42 Dizziness and giddiness: Secondary | ICD-10-CM | POA: Diagnosis not present

## 2023-01-17 DIAGNOSIS — I10 Essential (primary) hypertension: Secondary | ICD-10-CM | POA: Diagnosis not present

## 2023-01-17 DIAGNOSIS — N1832 Chronic kidney disease, stage 3b: Secondary | ICD-10-CM

## 2023-01-17 DIAGNOSIS — I5022 Chronic systolic (congestive) heart failure: Secondary | ICD-10-CM

## 2023-01-17 DIAGNOSIS — G4733 Obstructive sleep apnea (adult) (pediatric): Secondary | ICD-10-CM

## 2023-01-17 DIAGNOSIS — E1122 Type 2 diabetes mellitus with diabetic chronic kidney disease: Secondary | ICD-10-CM

## 2023-01-17 DIAGNOSIS — I2699 Other pulmonary embolism without acute cor pulmonale: Secondary | ICD-10-CM

## 2023-01-17 DIAGNOSIS — I2510 Atherosclerotic heart disease of native coronary artery without angina pectoris: Secondary | ICD-10-CM

## 2023-01-17 DIAGNOSIS — J449 Chronic obstructive pulmonary disease, unspecified: Secondary | ICD-10-CM

## 2023-01-17 LAB — BASIC METABOLIC PANEL
Anion gap: 9 (ref 5–15)
BUN: 31 mg/dL — ABNORMAL HIGH (ref 6–20)
CO2: 29 mmol/L (ref 22–32)
Calcium: 9.6 mg/dL (ref 8.9–10.3)
Chloride: 102 mmol/L (ref 98–111)
Creatinine, Ser: 1.65 mg/dL — ABNORMAL HIGH (ref 0.61–1.24)
GFR, Estimated: 49 mL/min — ABNORMAL LOW (ref >60)
Glucose, Bld: 141 mg/dL — ABNORMAL HIGH (ref 70–99)
Potassium: 4.2 mmol/L (ref 3.5–5.1)
Sodium: 140 mmol/L (ref 135–145)

## 2023-01-17 MED ORDER — CARVEDILOL 6.25 MG PO TABS: 6.2500 mg | ORAL_TABLET | Freq: Two times a day (BID) | ORAL | 3 refills | Status: DC

## 2023-01-17 NOTE — Progress Notes (Signed)
PCP: Merita Norton, FNP (last seen 12/19/22) Primary Cardiologist: Julien Nordmann, MD (last seen 12/24/22) HF cardiologist: Arvilla Meres, MD (last seen 01/24)  HPI:   Gabriel [name] is a 52 y/o male with a history of HTN, diabetes, COPD, CKD, asthma, NSVT, cirrhosis from NASH, ICD placement (10/23), previous tobacco use and chronic heart failure.    Echo 08/14/22: EF 40-45% with mild LVH, Grade I DD, severe hypokinesis of left ventricular, entire inferior and inferolateral wall. Echo 02/06/22: EF of 30-35% along with mild/moderate AR. Echo 11/24/21: EF of 20-25% along with mild LVH and mild Gabriel. Echo 05/21/21: EF of 40-45% along with mild LVH and mild Gabriel. Echo 05/21/21: EF of 40-45% along with mild LVH and mild Gabriel. Echo 06/08/20: EF of 35-40% along with mildly elevated PA pressure of 44.7 mmHg, moderate LAE and mild/moderate Gabriel. Echo 11/23/19: EF of 25-30% with mild LVH and moderately elevated PA Pressure. Echo 06/26/2019: EF of 30-35% along with moderately elevated PA pressure. Echo 04/29/17: EF 45-50%. Echo 11/28/16: EF of 40-45% along with mild Gabriel. EF has improved from 30-35% July 2017.   RHC/LHC done 06/13/20 and showed: Severe single-vessel coronary artery disease with chronic total occlusion of the proximal RCA.  The distal vessel fills via left to right collaterals. Mild to moderate, nonobstructive CAD involving the LAD and LCx with up to 50% stenosis. Mildly elevated left and right heart filling pressures. Moderate pulmonary hypertension (mean PAP 40 mmHg) with significantly elevated pulmonary vascular resistance (PVR 7.4 WU). Severely reduced Fick cardiac output/index.   Was in the ED 12/21/22 due to dizziness & chest pain. Admitted 05/20/22 for ICD placement and discharged the next day.   He presents today for a HF follow-up visit with a chief complaint of minimal SOB with moderate exertion. Chronic in nature. Has associated fatigue, dizziness and stable chest pain along with this. Denies cough,  palpitations, pedal edema or difficulty sleeping. Was supposed to increase his carvedilol dose at last visit but then realized that he would run out and he wouldn't be able to get a refill so he wanted to wait to increase it until he got a new prescription today.   He does feel like he stays sleepy "all the time". Says that he sleeps well at night but then feels like he can go right back to sleep after he's been up for a short period of time. Is wearing his CPAP nightly. Denies feelings of depression.   Did get approved for disability which is a big relief for him.   ROS: All systems negative except as listed in HPI, PMH and Problem List.  SH:  Social History   Socioeconomic History   Marital status: Widowed    Spouse name: Not on file   Number of children: 1   Years of education: 46   Highest education level: 12th grade  Occupational History   Occupation: disabled  Tobacco Use   Smoking status: Former    Packs/day: 0.50    Years: 33.00    Additional pack years: 0.00    Total pack years: 16.50    Types: Cigarettes    Quit date: 05/08/2020    Years since quitting: 2.6   Smokeless tobacco: Former    Quit date: 05/08/2020   Tobacco comments:    Quit Sept 2021  Vaping Use   Vaping Use: Former  Substance and Sexual Activity   Alcohol use: Not Currently    Comment: occassionally   Drug use: No   Sexual activity:  Not Currently  Other Topics Concern   Not on file  Social History Narrative   Lives at home with wife and his dad's wife.        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    Social Determinants of Health   Financial Resource Strain: Medium Risk (08/05/2022)   Overall Financial Resource Strain (CARDIA)    Difficulty of Paying Living Expenses: Somewhat hard  Food Insecurity: No Food Insecurity (08/15/2022)   Hunger Vital Sign    Worried About Running Out of Food in the Last Year: Never true    Ran Out  of Food in the Last Year: Never true  Transportation Needs: Unmet Transportation Needs (08/15/2022)   PRAPARE - Transportation    Lack of Transportation (Medical): Yes    Lack of Transportation (Non-Medical): Yes  Physical Activity: Insufficiently Active (01/20/2018)   Exercise Vital Sign    Days of Exercise per Week: 7 days    Minutes of Exercise per Session: 10 min  Stress: Stress Concern Present (09/02/2017)   [name] of Occupational Health - Occupational Stress Questionnaire    Feeling of Stress : Very much  Social Connections: Moderately Isolated (09/02/2017)   Social Connection and Isolation Panel [NHANES]    Frequency of Communication with Friends and Family: Twice a week    Frequency of Social Gatherings with Friends and Family: Once a week    Attends Religious Services: Never    Database administrator or Organizations: No    Attends Banker Meetings: Never    Marital Status: Widowed  Intimate Partner Violence: Not At Risk (05/14/2022)   Humiliation, Afraid, Rape, and Kick questionnaire    Fear of Current or Ex-Partner: No    Emotionally Abused: No    Physically Abused: No    Sexually Abused: No    FH:  Family History  Problem Relation Age of Onset   Diabetes Mother    Diabetes Mellitus II Mother    Hypothyroidism Mother    Hypertension Mother    Kidney failure Mother        Dialysis   Heart attack Mother        80 yo approximately   Hypertension Father    Gout Father    Cancer Maternal Grandfather    Diabetes Maternal Grandfather    Cancer Paternal Aunt     Past Medical History:  Diagnosis Date   CAD (coronary artery disease)    a. 04/2015 low risk MV;  b. 12/2016 Cath: minor irregs in LAD/Diag/LCX/OM, RCA 40p/m/d; c. 05/2020 Cath: LM nl, LAD 50d, LCX 30p, OM1 40, RCA 100p w/ L->R collats. CO/CI 3.1/1.3-->Med rx.   Chest wall pain, chronic    Chronic Troponin Elevation    CKD (chronic kidney disease), stage II-III    COPD (chronic  obstructive pulmonary disease) (HCC)    Diabetes mellitus without complication (HCC)    HFrEF (heart failure with reduced ejection fraction) (HCC)    a. 03/2015 Echo: EF 45-50%; b. 12/2015 Echo: EF 20-25%; c. 02/2016 Echo: EF 30-35%; d. 11/2016 Echo: EF 40-45%; e. 06/2019 Echo: EF 30-35%; f. 11/2019 Echo: EF 25-30%; g. 05/2020 Echo: EF 35-40%; h. 05/2021 Echo: EF 40-45%; i. 11/2021 Echo: EF 20-25%, glob HK, mild LVH, GrIII DD, sev red RV fxn, mild Gabriel.   Hypertension    Mitral regurgitation    Mild to moderate by October 2021 echocardiogram.   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 03/2015 Echo: EF 45-50%; b. 12/2015 Echo: EF 20-25%; c. 02/2016 Echo: EF 30-35%; d. 11/2016 Echo: EF 40-45%; e. 06/2019 Echo: EF 30-35%; f. 11/2019 Echo: EF 25-30%; g. 05/2020 Echo: EF 35-40%; h. 05/2021 Echo: EF 40-45%; i. 11/2021 Echo: EF 20-25%   Myocardial infarct (HCC)    NSVT (nonsustained ventricular tachycardia) (HCC)    a. 12/2015 noted on tele-->amio;  b. 12/2015 Event monitor: no VT noted.   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  12/31/19: RUL and RLL PEs.   Syncope    a. 01/2016 - felt to be vasovagal.   Prior to Admission medications   Medication Sig Start Date End Date Taking? Authorizing Provider  albuterol (VENTOLIN HFA) 108 (90 Base) MCG/ACT inhaler Inhale 2 puffs into the lungs every 4 (four) hours as needed for wheezing or shortness of breath. 10/21/22  Yes Dgayli, Lianne Bushy, MD  allopurinol (ZYLOPRIM) 100 MG tablet Take 1 tablet (100 mg total) by mouth daily. 09/17/22  Yes Jacky Kindle, FNP  azelastine (ASTELIN) 0.1 % nasal spray Place 2 sprays into both nostrils 2 (two) times daily. Use in each nostril as directed 12/19/22  Yes Jacky Kindle, FNP  blood glucose meter kit and supplies KIT Dispense based on patient and insurance preference. Use up to four times daily as directed. 07/08/22  Yes Jacky Kindle, FNP   carvedilol (COREG) 6.25 MG tablet Take 1 tablet (6.25 mg total) by mouth 2 (two) times daily. 01/17/23  Yes Clarisa Kindred A, FNP  cetirizine (ZYRTEC) 10 MG tablet Take 1 tablet (10 mg total) by mouth daily. 12/19/22  Yes Jacky Kindle, FNP  colchicine 0.6 MG tablet Day 1: Take 2 tablets PO for gout flare, repeat 1 tablet one hour following initial dose. Day 2: Take 1 tablet twice daily PO for an additional 2-3 days. 09/17/22  Yes Jacky Kindle, FNP  Continuous Blood Gluc Sensor (FREESTYLE LIBRE 3 SENSOR) MISC Place 1 sensor on the skin every 14 days. Use to check glucose continuously 05/30/22  Yes Bacigalupo, Marzella Schlein, MD  dapagliflozin propanediol (FARXIGA) 10 MG TABS tablet Take 1 tablet (10 mg total) by mouth daily. 11/08/22  Yes Elmus Mathes A, FNP  fenofibrate (TRICOR) 145 MG tablet Take 1 tablet (145 mg total) by mouth daily. 01/15/22  Yes Loetta Connelley, Inetta Fermo A, FNP  FLUoxetine (PROZAC) 20 MG capsule Take 1 capsule (20 mg total) by mouth daily. 02/08/22  Yes Jacky Kindle, FNP  fluticasone (FLONASE) 50 MCG/ACT nasal spray Place 2 sprays into both nostrils daily. 12/19/22  Yes Jacky Kindle, FNP  icosapent Ethyl (VASCEPA) 1 g capsule Take 2 capsules (2 g total) by mouth 2 (two) times daily. 09/16/22  Yes Jacky Kindle, FNP  insulin aspart (NOVOLOG) 100 UNIT/ML FlexPen Inject 6 units + correction before breakfast/lunch and 9 units + correction before supper (correction = 1 unit per 50 mg/dL above 161); max daily dose 36 units Patient taking differently: Inject 10 Units into the skin 3 (three) times daily with meals. Inject 10 units + correction before breakfast/lunch and supper (correction = 1 unit per 50 mg/dL above 096); max daily dose 36 units 07/09/22  Yes Merita Norton T, FNP  Insulin Glargine (BASAGLAR KWIKPEN) 100 UNIT/ML Inject 36 Units into the skin at bedtime. 01/19/22 02/09/23 Yes Swayze, Ava, DO  Insulin Pen Needle (PEN NEEDLES) 32G X  4 MM MISC 200 each by Does not apply route at bedtime. 05/09/22   Yes Jacky Kindle, FNP  meclizine (ANTIVERT) 25 MG tablet Take 1 tablet (25 mg total) by mouth 3 (three) times daily as needed for dizziness. 12/24/22  Yes Gollan, Tollie Pizza, MD  nitroGLYCERIN (NITROSTAT) 0.4 MG SL tablet Place 1 tablet (0.4 mg total) under the tongue every 5 (five) minutes x 3 doses as needed for chest pain. 09/17/22  Yes Jacky Kindle, FNP  omeprazole (PRILOSEC) 40 MG capsule Take 1 capsule (40 mg total) by mouth daily. 10/16/22  Yes Vanga, [name], MD  potassium chloride SA (KLOR-CON M) 20 MEQ tablet Take 1 tablet (20 mEq total) by mouth every other day. 08/27/22  Yes Bensimhon, Bevelyn Buckles, MD  rivaroxaban (XARELTO) 20 MG TABS tablet Take 1 tablet (20 mg total) by mouth daily with supper. 05/26/22  Yes Graciella Freer, PA-C  rosuvastatin (CRESTOR) 40 MG tablet Take 1 tablet (40 mg total) by mouth daily. 04/01/22  Yes Gollan, Tollie Pizza, MD  sacubitril-valsartan (ENTRESTO) 97-103 MG Take 1 tablet by mouth 2 (two) times daily. 08/27/22  Yes Bensimhon, Bevelyn Buckles, MD  Semaglutide, 2 MG/DOSE, 8 MG/3ML SOPN Inject 2 mg as directed once a week. Patient taking differently: Inject 2 mg as directed once a week. Saturdays 09/16/22  Yes Merita Norton T, FNP  spironolactone (ALDACTONE) 25 MG tablet Take 1 tablet (25 mg total) by mouth daily. 04/25/22  Yes Julliana Whitmyer, Inetta Fermo A, FNP  torsemide (DEMADEX) 20 MG tablet Take 2 tablets (40 mg total) by mouth daily. Take an extra 20 mg tablet daily as needed for edema Patient taking differently: Take 40 mg by mouth daily. Alternating days 60 mg and 40 mg 08/27/22  Yes Bensimhon, Bevelyn Buckles, MD  Furosemide (FUROSCIX) 80 MG/10ML CTKT Inject 1 Dose into the skin as needed (edema). 07/22/22   Bensimhon, Bevelyn Buckles, MD  metoCLOPramide (REGLAN) 5 MG tablet Take 1 tablet (5 mg total) by mouth 4 (four) times daily -  before meals and at bedtime for 14 days. 11/06/22 12/24/22  Toney Reil, MD  insulin lispro (HUMALOG) 100 UNIT/ML KwikPen Inject 3 units before breakfast  and lunch. 5 units before dinner. 01/19/22 01/21/22  Swayze, Ava, DO    Vitals:   01/17/23 0819  BP: 116/64  Pulse: 78  SpO2: 97%  Weight: 247 lb 3.2 oz (112.1 kg)   Wt Readings from Last 3 Encounters:  01/17/23 247 lb 3.2 oz (112.1 kg)  12/30/22 246 lb 9.6 oz (111.9 kg)  12/24/22 245 lb (111.1 kg)   Lab Results  Component Value Date   CREATININE 1.62 (H) 12/21/2022   CREATININE 1.55 (H) 11/11/2022   CREATININE 1.42 (H) 10/29/2022    PHYSICAL EXAM:  General:  Well appearing. No resp difficulty HEENT: normal Neck: supple. JVP flat. No lymphadenopathy or thryomegaly appreciated. Cor: PMI normal. Regular rate & rhythm. No rubs, gallops or murmurs. Lungs: clear Abdomen: soft, nontender, nondistended. No hepatosplenomegaly. No bruits or masses.  Extremities: no cyanosis, clubbing, rash, edema Neuro: alert & orientedx3, cranial nerves grossly intact. Moves all 4 extremities w/o difficulty. Affect pleasant.  ASSESSMENT & PLAN:  1.  Chronic HFrEF/Mixed Ischemic and nonischemic cardiomyopathy:   - Mixed ICM/NICM - NYHA class II - s/p [place] [employer] Sq-ICD  - weighing daily;  reminded to call for an overnight weight gain of > 2 pounds or a weekly weight gain of > 5 pounds - weight stable from last visit here  2 weeks ago - Echo 08/14/22: EF 40-45% with mild LVH, Grade I DD, severe hypokinesis of left ventricular, entire inferior and inferolateral wall.  - Echo 02/06/22: EF of 30-35% along with mild/moderate AR.  - Echo 11/24/21: EF of 20-25% along with mild LVH and mild Gabriel.  - Echo 05/21/21: EF of 40-45% along with mild LVH and mild Gabriel. Echo 05/21/21: EF of 40-45% along with mild LVH and mild Gabriel.  - Echo 06/08/20: EF of 35-40% along with mildly elevated PA pressure of 44.7 mmHg, moderate LAE and mild/moderate Gabriel. - continue Entresto to 97/103 bid - continue Farxiga 10mg  daily - continue spiro 25 daily - increased carvedilol to 6.25mg  BID; new RX provided today - continue torsemide 60mg  QOD/  40mg  QOD with potassium QOD - continue to use Furoscix if weight >= 250; last used ~ 2 months ago - consider Cardiomems to help volume management as needed -> his insurance won't cover - saw EP Lalla Brothers) 01/24 - ICD placed on 05/20/22 - saw [facility] provider (Bensimhon) 01/24 - BNP 12/21/22 was 34.0 - PharmD reconciled meds w/ patient   2.  CAD/Elevated Troponin and chronic CP:  - H/o CAD  - RHC/LHC done 10/21 and showed:  Severe single-vessel coronary artery disease with chronic total occlusion of the proximal RCA.  The distal vessel fills via left to right collaterals.  Mild to moderate, nonobstructive CAD involving the LAD and LCx with up to 50% stenosis.  Mildly elevated left and right heart filling pressures.  Moderate pulmonary hypertension (mean PAP 40 mmHg) with significantly elevated pulmonary vascular resistance (PVR 7.4 WU).  Severely reduced Fick cardiac output/index. - Stable angina - continue rosuvastatin 40mg  daily - saw cardiology Mariah Milling) 05/24   3.  HTN:  - BP 116/64 - saw PCP Suzie Portela) 05/24 - BMP 12/21/22 showed sodium 139, potassium 3.9, creatinine 1.62 and GFR 50; repeat this at next visit - BMP today   4.  H/o PE/Chronic anticoagulation:  - continue xarelto.  - no recent bleeding   5.  DM2- - saw nephrology (Korrapati) 08/23  - A1c 07/22/22 was 7.1%; recheck this today   6. OSA- - Sleep study 3/22: AHI 28  - sleep study 12/23; AHI 45.1 - wearing CPAP nightly but reports feeling sleepy and tired even after sleeping all night - thyroid panel ordered today  7: COPD- - stopped smoking 2021 - had smoked for close to 40 years with starting at the age of 89 - at his peak, he was smoking 2-2.5 ppd - saw pulmonology (Dgayli) 03/24  8: Dizziness- - meclizine helping some but dizziness continues - ENT referral made last visit and he's still waiting to hear from them - encouraged him to contact their office  Return in 1 month, sooner if needed.

## 2023-01-17 NOTE — Patient Instructions (Signed)
Increase your carvedilol 6.25mg  to twice daily

## 2023-01-17 NOTE — Progress Notes (Signed)
[facility] HEART FAILURE CLINIC - Pharmacist Note  Gabriel [name]. is a 52 y.o. male with HFimpEF (baseline EF <40% with a follow-up EF >40%) presenting to the Heart Failure Clinic for follow up. He reports no changes and no issues on current regimen. At last visit, he expressed concern about insurance coverage with his current pharmacy. Today, he states that this has been resolved. He did not increase his carvedilol to 6.25 mg twice daily as directed at last visit. He states that he is here today for a new carvedilol prescription.  Recent ED Visit (past 6 months):  Date: 12/21/2022, CC: chest pain and dizziness  Date: 07/25/2022, CC: dizziness and weakness  Guideline-Directed Medical Therapy/Evidence Based Medicine ACE/ARB/ARNI: Sacubitril/valsartan 97/103 mg BID Beta Blocker: Carvedilol 3.125 mg twice daily Aldosterone Antagonist: Spironolactone 25 mg daily Diuretic: Torsemide 60 mg and 40 mg on alternating days SGLT2i: Dapagliflozin 10 mg daily  Adherence Assessment Do you ever forget to take your medication? [] Yes [x] No  Do you ever skip doses due to side effects? [] Yes [x] No  Do you have trouble affording your medicines? [] Yes [x] No  Are you ever unable to pick up your medication due to transportation difficulties? [] Yes [x] No  Do you ever stop taking your medications because you don't believe they are helping? [] Yes [x] No  Do you check your weight daily? [x] Yes [] No  Adherence strategy: pillbox Barriers to obtaining medications: none reported   Diagnostics ECHO: Date 08/14/2022, EF 40-45%, RWMA, mild LVH, G1DD   Vitals    12/30/2022    8:21 AM 12/24/2022    4:14 PM 12/21/2022    1:35 PM  Vitals with BMI  Height  5\' 11"    Weight 246 lbs 10 oz 245 lbs   BMI 34.41 34.19   Systolic 141 124 161  Diastolic 78 87 69  Pulse 82 100 73     Recent Labs    Latest Ref Rng & Units 12/21/2022    8:59 AM 11/11/2022    1:21 PM 10/29/2022   11:15 AM  BMP  Glucose 70 - 99 mg/dL 096  045   409   BUN 6 - 20 mg/dL 24  30  26    Creatinine 0.61 - 1.24 mg/dL 8.11  9.14  7.82   Sodium 135 - 145 mmol/L 139  141  139   Potassium 3.5 - 5.1 mmol/L 3.9  4.0  3.8   Chloride 98 - 111 mmol/L 104  103  104   CO2 22 - 32 mmol/L 24  27  24    Calcium 8.9 - 10.3 mg/dL 9.5  9.8  9.5     Past Medical History Past Medical History:  Diagnosis Date   CAD (coronary artery disease)    a. 04/2015 low risk MV;  b. 12/2016 Cath: minor irregs in LAD/Diag/LCX/OM, RCA 40p/m/d; c. 05/2020 Cath: LM nl, LAD 50d, LCX 30p, OM1 40, RCA 100p w/ L->R collats. CO/CI 3.1/1.3-->Med rx.   Chest wall pain, chronic    Chronic Troponin Elevation    CKD (chronic kidney disease), stage II-III    COPD (chronic obstructive pulmonary disease) (HCC)    Diabetes mellitus without complication (HCC)    HFrEF (heart failure with reduced ejection fraction) (HCC)    a. 03/2015 Echo: EF 45-50%; b. 12/2015 Echo: EF 20-25%; c. 02/2016 Echo: EF 30-35%; d. 11/2016 Echo: EF 40-45%; e. 06/2019 Echo: EF 30-35%; f. 11/2019 Echo: EF 25-30%; g. 05/2020 Echo: EF 35-40%; h. 05/2021 Echo: EF 40-45%; i. 11/2021 Echo: EF 20-25%,  glob HK, mild LVH, GrIII DD, sev red RV fxn, mild MR.   Hypertension    Mitral regurgitation    Mild to moderate by October 2021 echocardiogram.   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 03/2015 Echo: EF 45-50%; b. 12/2015 Echo: EF 20-25%; c. 02/2016 Echo: EF 30-35%; d. 11/2016 Echo: EF 40-45%; e. 06/2019 Echo: EF 30-35%; f. 11/2019 Echo: EF 25-30%; g. 05/2020 Echo: EF 35-40%; h. 05/2021 Echo: EF 40-45%; i. 11/2021 Echo: EF 20-25%   Myocardial infarct (HCC)    NSVT (nonsustained ventricular tachycardia) (HCC)    a. 12/2015 noted on tele-->amio;  b. 12/2015 Event monitor: no VT noted.   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  12/31/19: RUL and RLL PEs.   Syncope    a. 01/2016 - felt to be vasovagal.    Plan Continue regimen as  directed by NP Increase carvedilol to 6.25 mg twice daily Continue to titrate GDMT as able per BP and renal function Annual echo due 07/2023  Time spent: 5 minutes  Celene Squibb, PharmD PGY1 Pharmacy Resident 01/17/2023 8:35 AM

## 2023-01-18 LAB — HEMOGLOBIN A1C
Hgb A1c MFr Bld: 6.6 % — ABNORMAL HIGH (ref 4.8–5.6)
Mean Plasma Glucose: 143 mg/dL

## 2023-01-18 LAB — THYROID PANEL WITH TSH
Free Thyroxine Index: 3.6 (ref 1.2–4.9)
T3 Uptake Ratio: 39 % (ref 24–39)
T4, Total: 9.3 ug/dL (ref 4.5–12.0)
TSH: 2.29 u[IU]/mL (ref 0.450–4.500)

## 2023-01-20 ENCOUNTER — Encounter: Payer: Self-pay | Admitting: Family

## 2023-01-25 ENCOUNTER — Other Ambulatory Visit: Payer: Self-pay | Admitting: Family Medicine

## 2023-01-27 NOTE — Telephone Encounter (Signed)
Requested Prescriptions  Pending Prescriptions Disp Refills   OZEMPIC, 2 MG/DOSE, 8 MG/3ML SOPN [Pharmacy Med Name: Ozempic (2 MG/DOSE) 8 MG/3ML Subcutaneous Solution Pen-injector] 9 mL 0    Sig: INJECT 1 DOSE (2MG ) SUBCUTANEOUSLY  ONCE A WEEK     Endocrinology:  Diabetes - GLP-1 Receptor Agonists - semaglutide Failed - 01/27/2023 12:35 PM      Failed - HBA1C in normal range and within 180 days    Hgb A1c MFr Bld  Date Value Ref Range Status  01/17/2023 6.6 (H) 4.8 - 5.6 % Final    Comment:    (NOTE)         Prediabetes: 5.7 - 6.4         Diabetes: >6.4         Glycemic control for adults with diabetes: <7.0          Failed - Cr in normal range and within 360 days    Creatinine  Date Value Ref Range Status  10/07/2013 0.77 0.60 - 1.30 mg/dL Final   Creatinine, Ser  Date Value Ref Range Status  01/17/2023 1.65 (H) 0.61 - 1.24 mg/dL Final         Passed - Valid encounter within last 6 months    Recent Outpatient Visits           1 month ago Seasonal allergic rhinitis due to pollen   [facility] Merita Norton T, FNP   4 months ago Atypical chest pain   [facility] Merita Norton T, FNP   5 months ago Chronic HFrEF (heart failure with reduced ejection fraction) [facility])   [place] [facility] Merita Norton T, FNP   8 months ago Chronic HFrEF (heart failure with reduced ejection fraction) [facility])   [place] [facility] Merita Norton T, FNP   8 months ago Chronic HFrEF (heart failure with reduced ejection fraction) [facility])   [place] [facility] Jacky Kindle, FNP       Future Appointments             In 3 days Deirdre Evener, MD  [facility]   In 2 weeks Raechel Chute, MD [facility]monary Care at [place]   In 2 months Vanga, [name], MD [facility]

## 2023-01-29 ENCOUNTER — Telehealth: Payer: Self-pay | Admitting: Pharmacist

## 2023-01-29 NOTE — Telephone Encounter (Signed)
Patient advised to check with pharmacy. 

## 2023-01-29 NOTE — Progress Notes (Signed)
Received VM from patient that he needs a refill placed for Ozempic and pen needles. Routing to PCP. Will also route to Estelle Grumbles who covers this practice for follow up.   Catie Eppie Gibson, PharmD, BCACP, CPP [facility] [phone]

## 2023-01-30 ENCOUNTER — Ambulatory Visit (INDEPENDENT_AMBULATORY_CARE_PROVIDER_SITE_OTHER): Payer: Medicaid Other | Admitting: Dermatology

## 2023-01-30 VITALS — BP 138/77 | HR 86

## 2023-01-30 DIAGNOSIS — Z7189 Other specified counseling: Secondary | ICD-10-CM

## 2023-01-30 DIAGNOSIS — X32XXXA Exposure to sunlight, initial encounter: Secondary | ICD-10-CM

## 2023-01-30 DIAGNOSIS — L409 Psoriasis, unspecified: Secondary | ICD-10-CM

## 2023-01-30 DIAGNOSIS — W908XXA Exposure to other nonionizing radiation, initial encounter: Secondary | ICD-10-CM | POA: Diagnosis not present

## 2023-01-30 DIAGNOSIS — Z79899 Other long term (current) drug therapy: Secondary | ICD-10-CM

## 2023-01-30 DIAGNOSIS — L4050 Arthropathic psoriasis, unspecified: Secondary | ICD-10-CM

## 2023-01-30 DIAGNOSIS — L578 Other skin changes due to chronic exposure to nonionizing radiation: Secondary | ICD-10-CM

## 2023-01-30 NOTE — Patient Instructions (Addendum)
Recommend getting and starting Taltz injections prescribed by rheumatologist to help with psoriasis and joint pain.  Will reevaluate in 6 months   Reviewed risks of biologics including immunosuppression, infections, injection site reaction, and failure to improve condition. Goal is control of skin condition, not cure.  Some older biologics such as Humira and Enbrel may slightly increase risk of malignancy and may worsen congestive heart failure.  Taltz and Cosentyx may cause inflammatory bowel disease to flare. The use of biologics requires long term medication management, including periodic office visits and monitoring of blood work.      Due to recent changes in healthcare laws, you may see results of your pathology and/or laboratory studies on MyChart before the doctors have had a chance to review them. We understand that in some cases there may be results that are confusing or concerning to you. Please understand that not all results are received at the same time and often the doctors may need to interpret multiple results in order to provide you with the best plan of care or course of treatment. Therefore, we ask that you please give [place] 2 business days to thoroughly review all your results before contacting the office for clarification. Should we see a critical lab result, you will be contacted sooner.   If You Need Anything After Your Visit  If you have any questions or concerns for your doctor, please call our main line at [phone] and press option 4 to reach your doctor's medical assistant. If no one answers, please leave a voicemail as directed and we will return your call as soon as possible. Messages left after 4 pm will be answered the following business day.   You may also send [place] a message via MyChart. We typically respond to MyChart messages within 1-2 business days.  For prescription refills, please ask your pharmacy to contact our office. Our fax number is [phone].  If  you have an urgent issue when the clinic is closed that cannot wait until the next business day, you can page your doctor at the number below.    Please note that while we do our best to be available for urgent issues outside of office hours, we are not available 24/7.   If you have an urgent issue and are unable to reach [place], you may choose to seek medical care at your doctor's office, retail clinic, urgent care center, or emergency room.  If you have a medical emergency, please immediately call 911 or go to the emergency department.  Pager Numbers  - Dr. Gwen Pounds: [phone]  - Dr. Neale Burly: [phone]  - Dr. Roseanne Reno: [phone]  In the event of inclement weather, please call our main line at [phone] for an update on the status of any delays or closures.  Dermatology Medication Tips: Please keep the boxes that topical medications come in in order to help keep track of the instructions about where and how to use these. Pharmacies typically print the medication instructions only on the boxes and not directly on the medication tubes.   If your medication is too expensive, please contact our office at [phone] option 4 or send [place] a message through MyChart.   We are unable to tell what your co-pay for medications will be in advance as this is different depending on your insurance coverage. However, we may be able to find a substitute medication at lower cost or fill out paperwork to get insurance to cover a needed medication.   If a prior  authorization is required to get your medication covered by your insurance company, please allow us 1-2 business days to complete this process.  Drug prices often vary depending on where the prescription is filled and some pharmacies may offer cheaper prices.  The website www.goodrx.com contains coupons for medications through different pharmacies. The prices here do not account for what the cost may be with help from insurance (it may be cheaper  with your insurance), but the website can give you the price if you did not use any insurance.  - You can print the associated coupon and take it with your prescription to the pharmacy.  - You may also stop by our office during regular business hours and pick up a GoodRx coupon card.  - If you need your prescription sent electronically to a different pharmacy, notify our office through [facility] MyChart or by phone at [phone] option 4.     Si Usted Necesita Algo Despus de Su Visita  Tambin puede enviarnos un mensaje a travs de MyChart. Por lo general respondemos a los mensajes de MyChart en el transcurso de 1 a 2 das hbiles.  Para renovar recetas, por favor pida a su farmacia que se ponga en contacto con nuestra oficina. Nuestro nmero de fax es el [phone].  Si tiene un asunto urgente cuando la clnica est cerrada y que no puede esperar hasta el siguiente da hbil, puede llamar/localizar a su doctor(a) al nmero que aparece a continuacin.   Por favor, tenga en cuenta que aunque hacemos todo lo posible para estar disponibles para asuntos urgentes fuera del horario de oficina, no estamos disponibles las 24 horas del da, los 7 das de la semana.   Si tiene un problema urgente y no puede comunicarse con nosotros, puede optar por buscar atencin mdica  en el consultorio de su doctor(a), en una clnica privada, en un centro de atencin urgente o en una sala de emergencias.  Si tiene una emergencia mdica, por favor llame inmediatamente al 911 o vaya a la sala de emergencias.  Nmeros de bper  - Dr. Kowalski: [phone]  - Dra. Moye: [phone]  - Dra. Stewart: [phone]  En caso de inclemencias del tiempo, por favor llame a nuestra lnea principal al [phone] para una actualizacin sobre el estado de cualquier retraso o cierre.  Consejos para la medicacin en dermatologa: Por favor, guarde las cajas en las que vienen los medicamentos de uso tpico para  ayudarle a seguir las instrucciones sobre dnde y cmo usarlos. Las farmacias generalmente imprimen las instrucciones del medicamento slo en las cajas y no directamente en los tubos del medicamento.   Si su medicamento es muy caro, por favor, pngase en contacto con nuestra oficina llamando al [phone] y presione la opcin 4 o envenos un mensaje a travs de MyChart.   No podemos decirle cul ser su copago por los medicamentos por adelantado ya que esto es diferente dependiendo de la cobertura de su seguro. Sin embargo, es posible que podamos encontrar un medicamento sustituto a menor costo o llenar un formulario para que el seguro cubra el medicamento que se considera necesario.   Si se requiere una autorizacin previa para que su compaa de seguros cubra su medicamento, por favor permtanos de 1 a 2 das hbiles para completar este proceso.  Los precios de los medicamentos varan con frecuencia dependiendo del lugar de dnde se surte la receta y alguna farmacias pueden ofrecer precios ms baratos.  El sitio web www.goodrx.com tiene cupones para   medicamentos de [employer]. Los precios aqu no tienen en cuenta lo que podra costar con la ayuda del seguro (puede ser ms barato con su seguro), pero el sitio web puede darle el precio si no utiliz Tourist information centre manager.  - Puede imprimir el cupn correspondiente y llevarlo con su receta a la farmacia.  - Tambin puede pasar por nuestra oficina durante el horario de atencin regular y Education officer, museum una tarjeta de cupones de GoodRx.  - Si necesita que su receta se enve electrnicamente a una farmacia diferente, informe a nuestra oficina a travs de MyChart de [place] o por telfono llamando al [phone] y presione la opcin 4.

## 2023-01-30 NOTE — Progress Notes (Signed)
   New Patient Visit   Subjective  Gabriel [name]. is a 52 y.o. male who presents for the following: itchy patches legs, arms, trunk, back Clobesol solution and ointment to use, has been dealing with for several years  Has been seen by rheumatologist concerning joint pain. Told has PsA and plan on prescribing Taltz. Patient has been unable to  The following portions of the chart were reviewed this encounter and updated as appropriate: medications, allergies, medical history  Review of Systems:  No other skin or systemic complaints except as noted in HPI or Assessment and Plan.  Objective  Well appearing patient in no apparent distress; mood and affect are within normal limits.  A focused examination was performed of the following areas: Legs, arms, chest, trunk, back, scalp, elbows   Relevant exam findings are noted in the Assessment and Plan.                          Assessment & Plan   PSORIASIS with PsA worsening  Exam: Well-demarcated erythematous papules/plaques with silvery scale, guttate pink scaly papules. 40% BSA. With facial, back, trunk, arms, elbows, legs and scalp involvement  Chronic and persistent condition with duration or expected duration over one year. Condition is bothersome/symptomatic for patient. Currently flared. patient has joint pain  Seen by Rheumatology diagnosed with PsA  planning to start patient on Taltz   Psoriasis is a chronic non-curable, but treatable genetic/hereditary disease that may have other systemic features affecting other organ systems such as joints (Psoriatic Arthritis). It is associated with an increased risk of inflammatory bowel disease, heart disease, non-alcoholic fatty liver disease, and depression.  Treatments include light and laser treatments; topical medications; and systemic medications including oral and injectables.  Treatment Plan: Recommend starting Taltz to help with psoriasis and joint pain.    Patient should be getting disability money soon to pay copayment.  Do not recommend starting other treatments at this time as Altamease Oiler may resolve most everything.  If he needs further treatment such as topicals in the future we will reevaluate that on follow-up.  ACTINIC DAMAGE - chronic, secondary to cumulative UV radiation exposure/sun exposure over time - diffuse scaly erythematous macules with underlying dyspigmentation - Recommend daily broad spectrum sunscreen SPF 30+ to sun-exposed areas, reapply every 2 hours as needed.  - Recommend staying in the shade or wearing long sleeves, sun glasses (UVA+UVB protection) and wide brim hats (4-inch brim around the entire circumference of the hat). - Call for new or changing lesions.  6 month recheck   Return for 6 month psoriasis follow up.  Gabriel [name], CMA, am acting as scribe for Armida Sans, MD.  Documentation: I have reviewed the above documentation for accuracy and completeness, and I agree with the above.  Armida Sans, MD

## 2023-01-31 IMAGING — CR DG CHEST 2V
1 series · 2 of 2 positions shown · non-contrast
Comparison: 05/20/2021

CLINICAL DATA: Short of breath

EXAM:
CHEST - 2 VIEW

[Series 1: dg chest 2 view · 0.14mm/px · 2 of 2 slices shown]
[im 1/2]
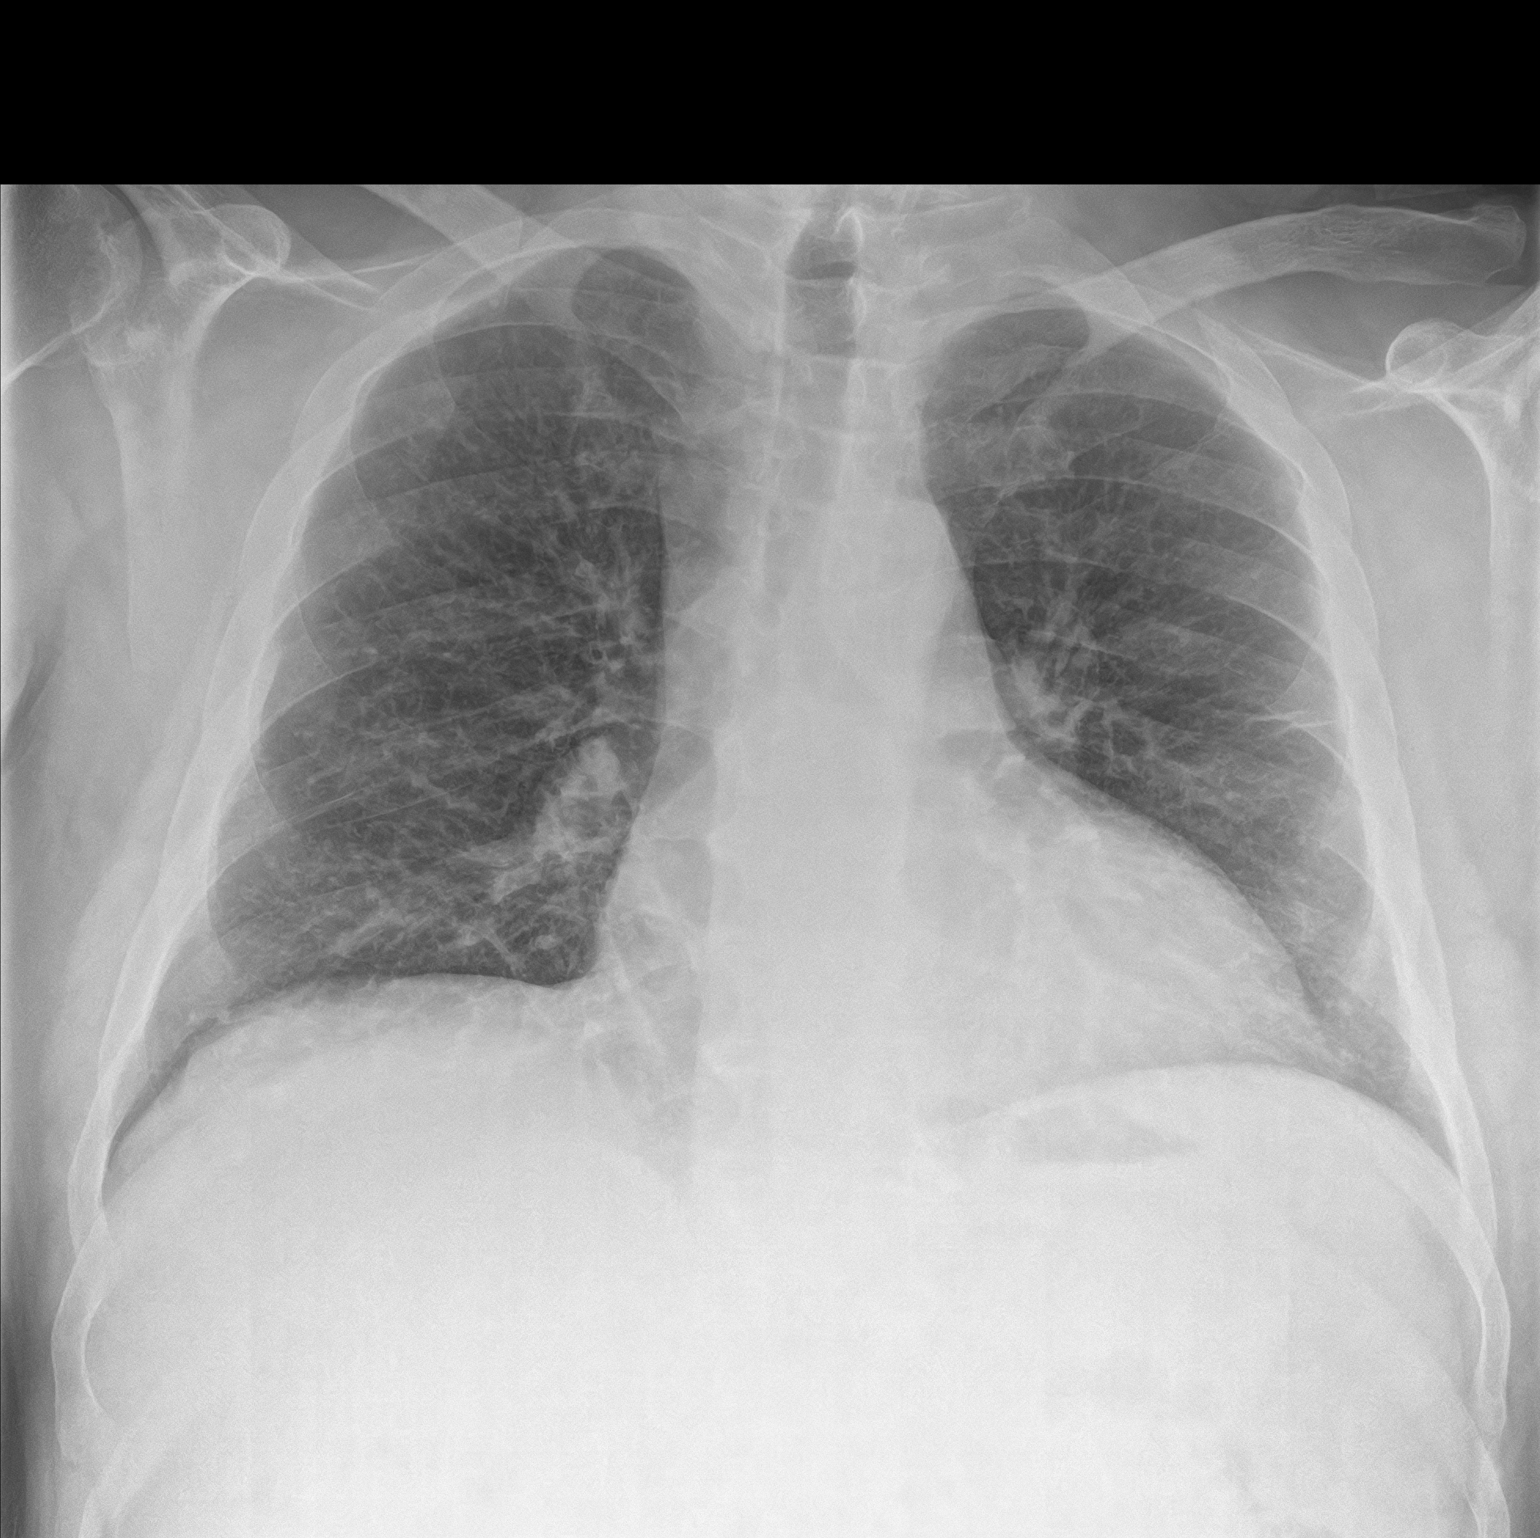
[im 2/2]
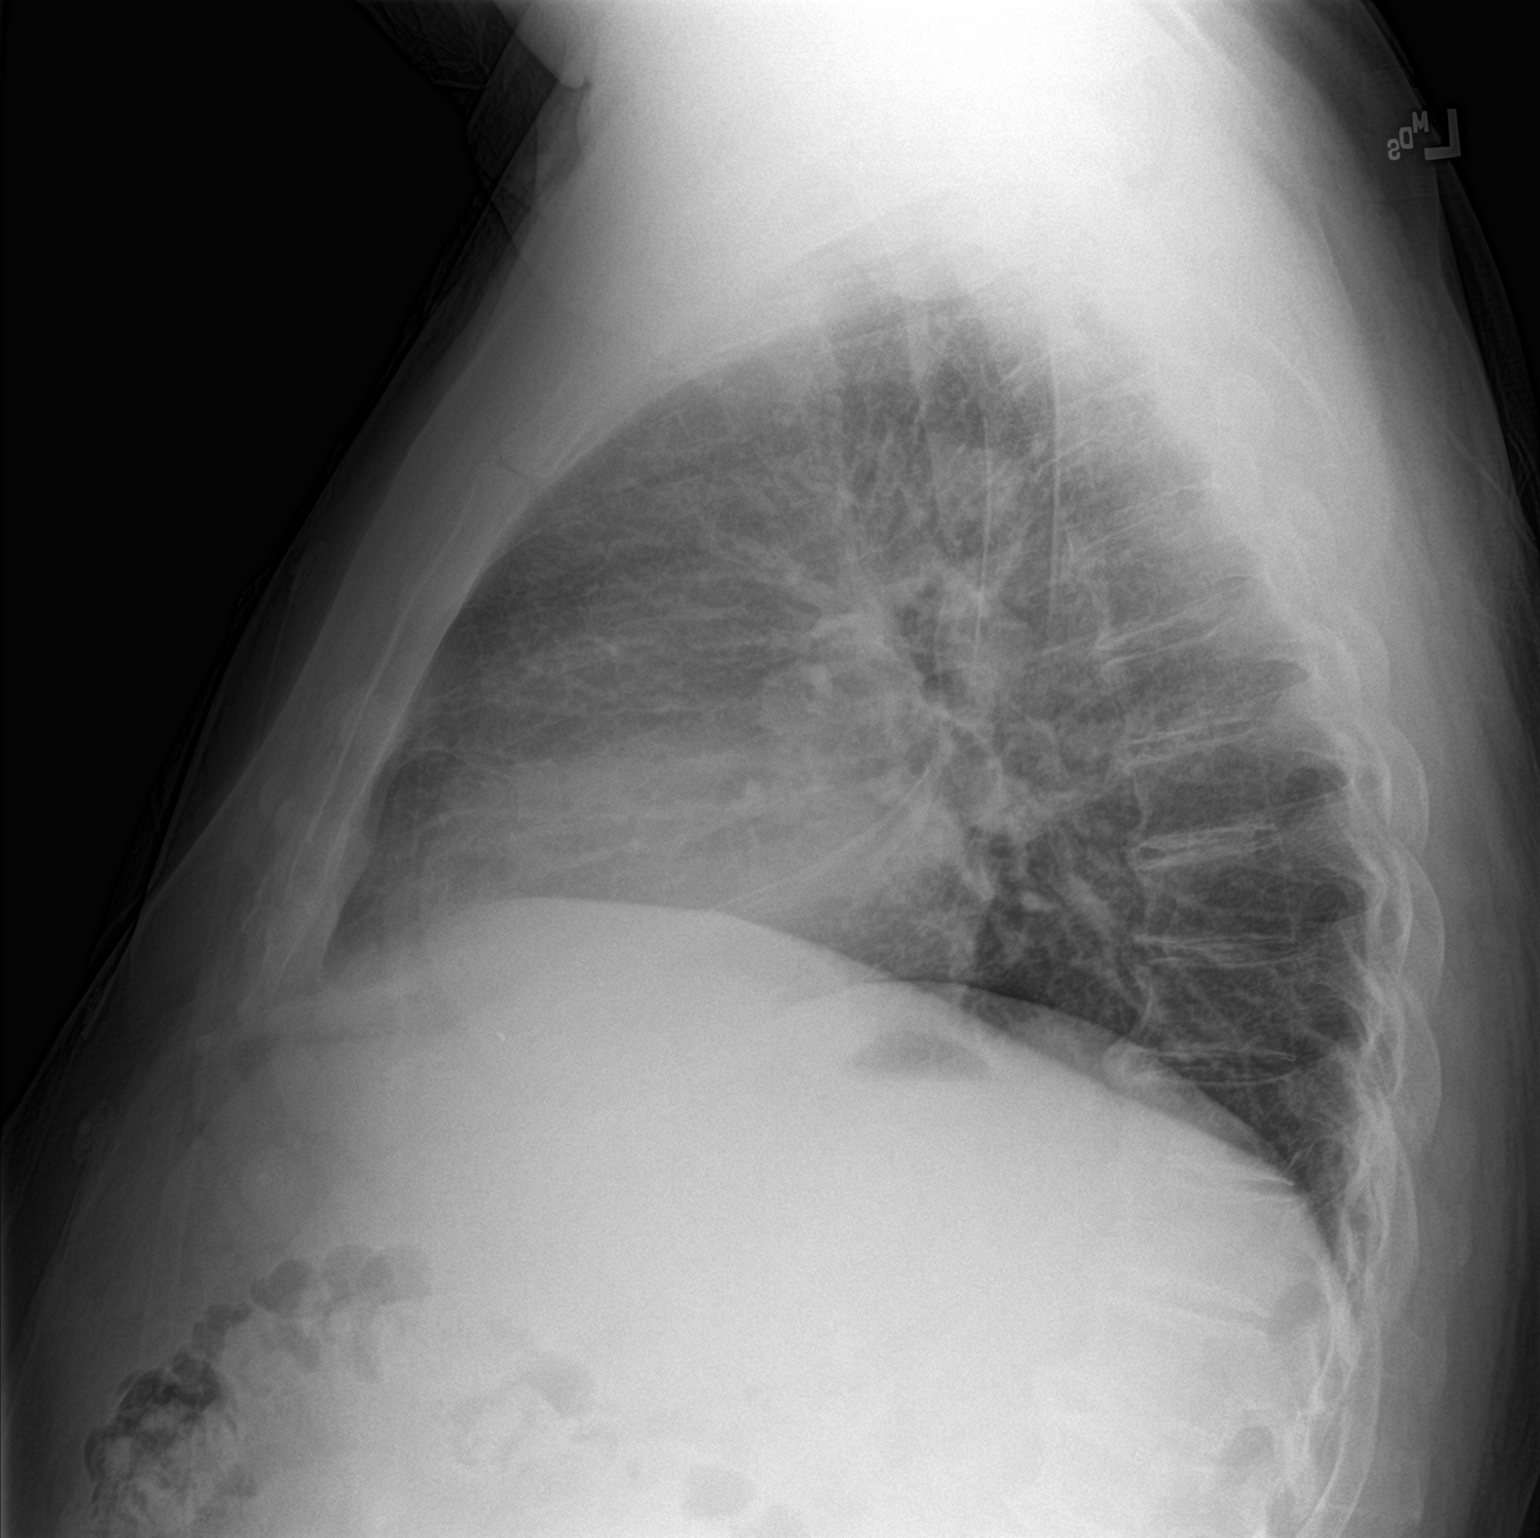

[2 of 2 positions shown; findings below may reference images not displayed]

FINDINGS: The heart size and mediastinal contours are within normal limits.
Both lungs are clear. The visualized skeletal structures are
unremarkable.
IMPRESSION: No active cardiopulmonary disease.

## 2023-01-31 IMAGING — CT CT ANGIO CHEST
3 of 7 series · 19 of 46 positions shown · IV contrast (omnipaque)
Comparison: None.

CLINICAL DATA: Pulmonary embolism suspected. High probability.
Bloody sputum.

EXAM:
CT ANGIOGRAPHY CHEST WITH CONTRAST
TECHNIQUE: Multidetector CT imaging of the chest was performed using the
standard protocol during bolus administration of intravenous
contrast. Multiplanar CT image reconstructions and MIPs were
obtained to evaluate the vascular anatomy.
CONTRAST:  75mL OMNIPAQUE IOHEXOL 350 MG/ML SOLN

[Series 5: thins · axial · 0.74mm/px · z∈[-83,+154]mm · 9 of 99 slices shown]
[im 10/99  lung]
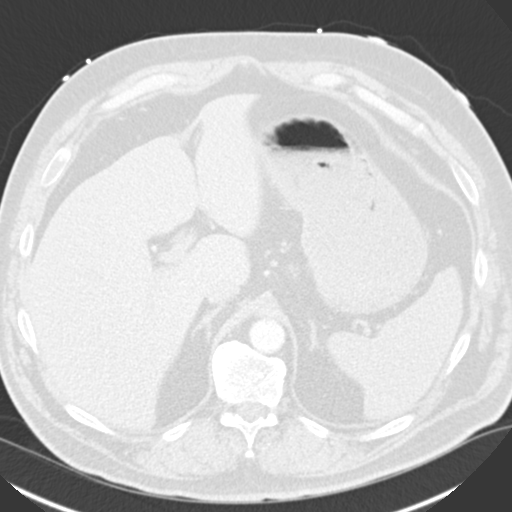
[im 20/99  soft-tissue]
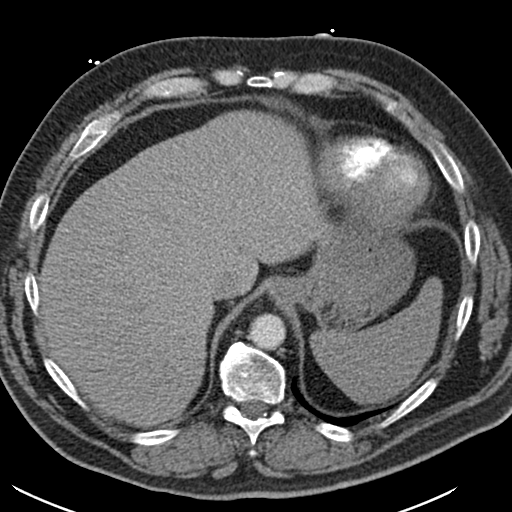
[im 30/99  lung]
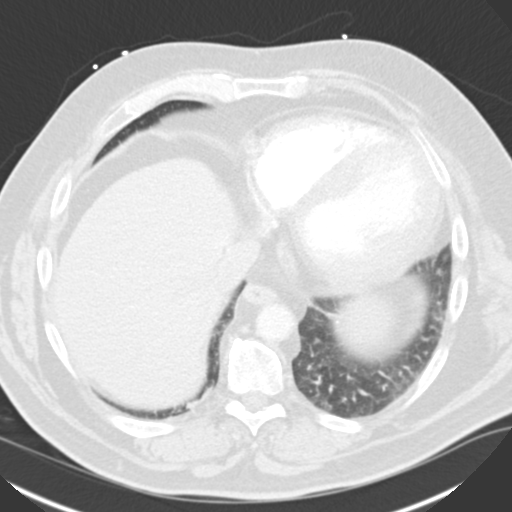
[im 40/99  soft-tissue]
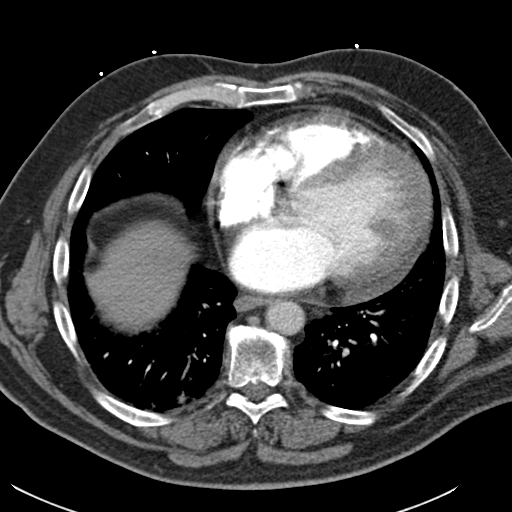
[im 50/99  lung]
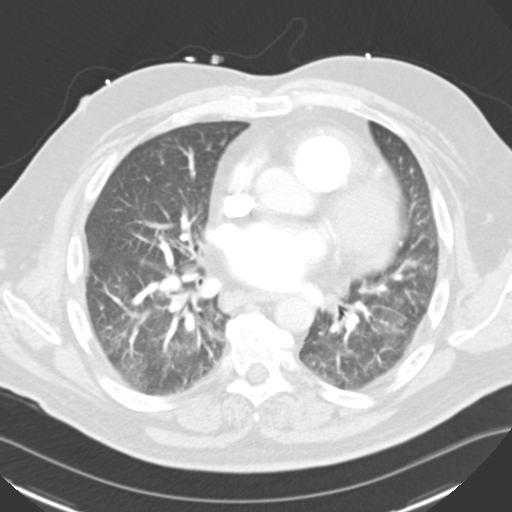
[im 59/99  soft-tissue]
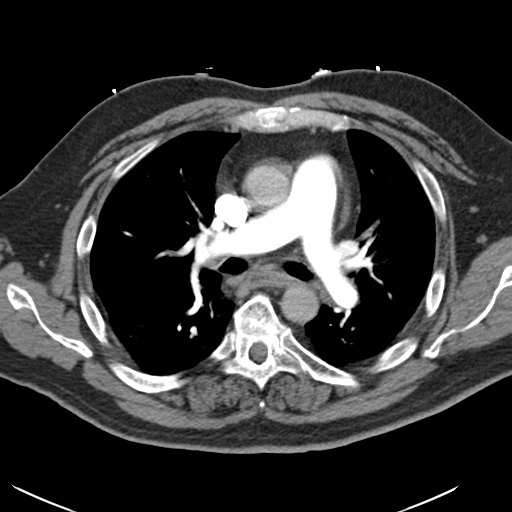
[im 69/99  lung]
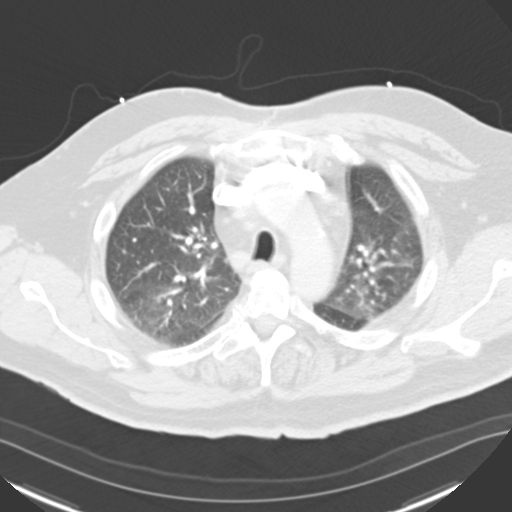
[im 79/99  soft-tissue]
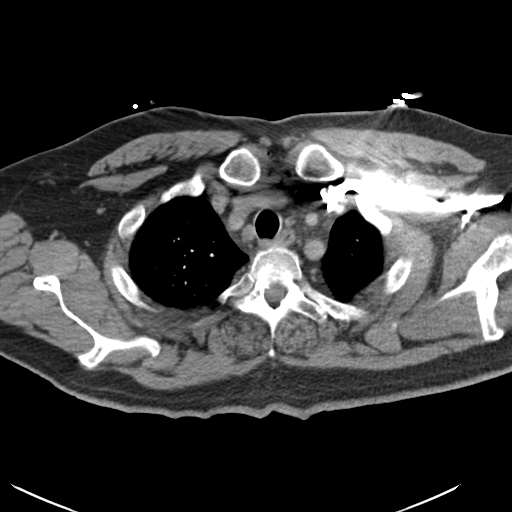
[im 89/99  lung]
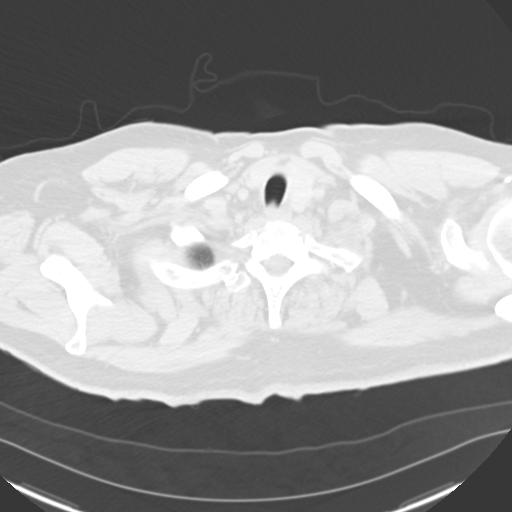

[Series 6: lung · axial · 0.74mm/px · z∈[-83,+124]mm · 7 of 99 slices shown]
[im 10/99  soft-tissue]
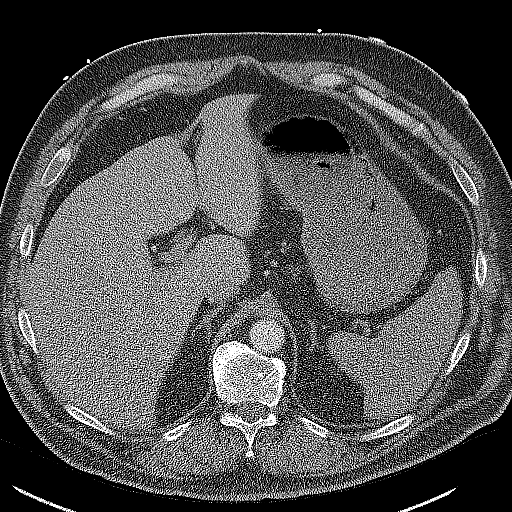
[im 20/99  soft-tissue]
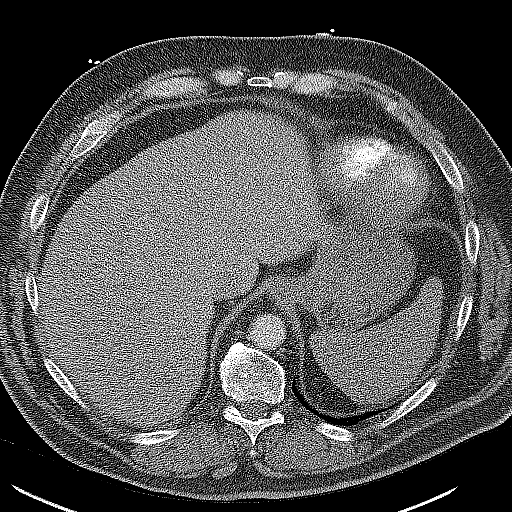
[im 30/99  soft-tissue]
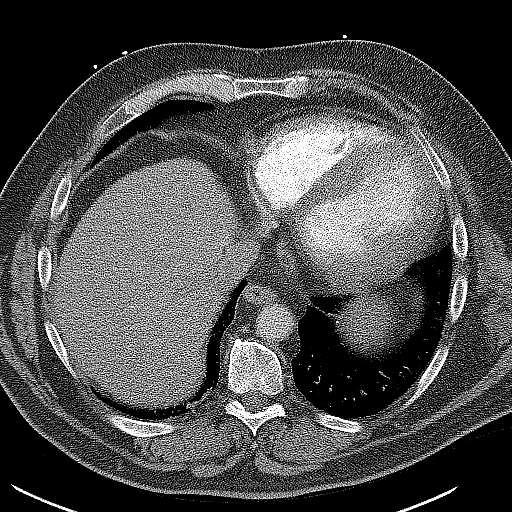
[im 40/99  soft-tissue]
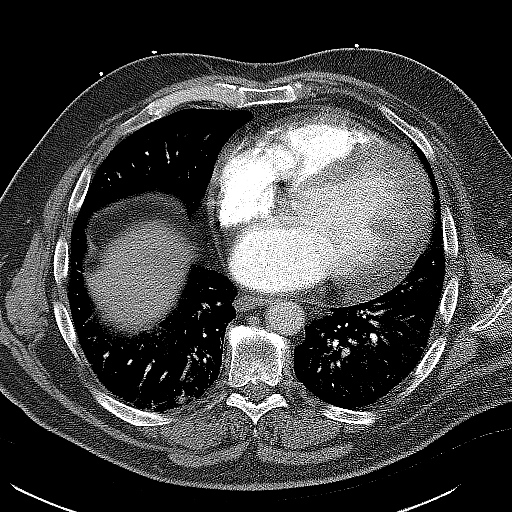
[im 59/99  soft-tissue]
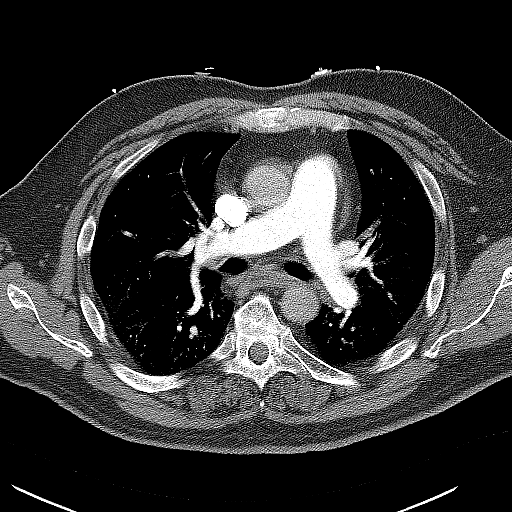
[im 69/99  soft-tissue]
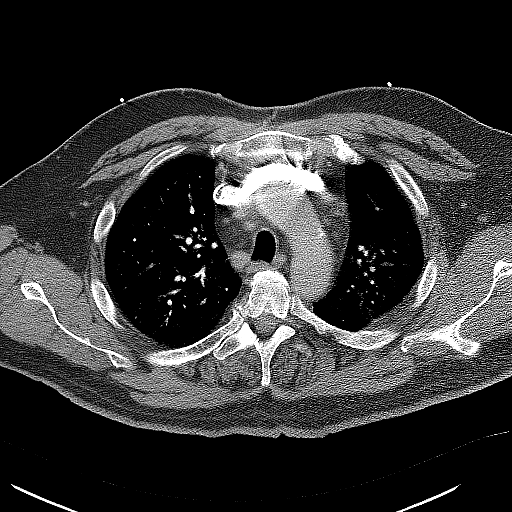
[im 79/99  soft-tissue]
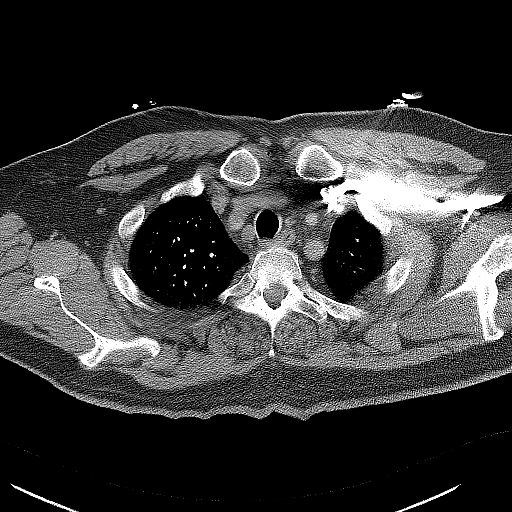

[Series 7: coronal mpr · coronal · 0.63mm/px · 3 of 95 slices shown]
[im 24/95  soft-tissue]
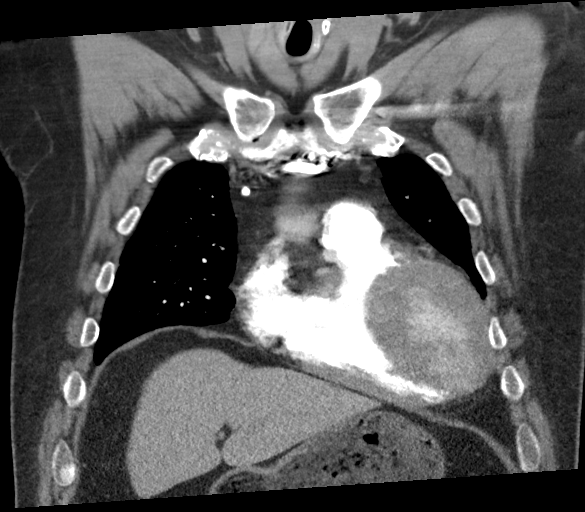
[im 48/95  soft-tissue]
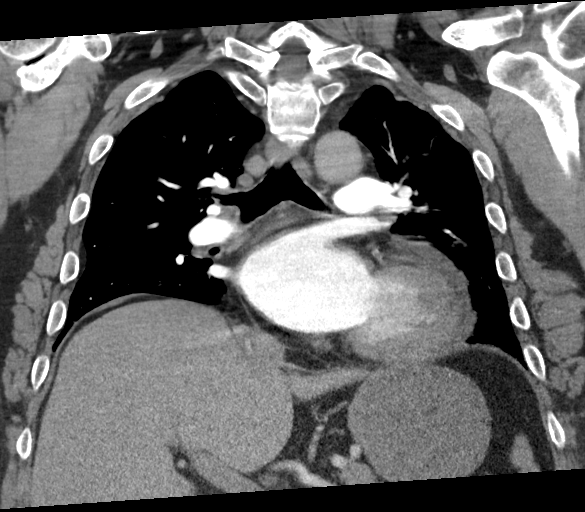
[im 71/95  soft-tissue]
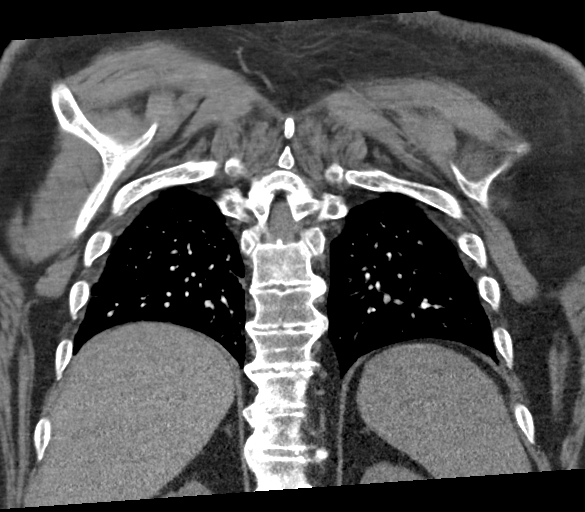

[19 of 46 positions shown; findings below may reference images not displayed]

FINDINGS: Cardiovascular: No filling defects within the pulmonary arteries to
suggest acute pulmonary embolism.

Mediastinum/Nodes: No significant vascular findings. Normal heart
size. No pericardial effusion.

Lungs/Pleura: No suspicious pulmonary nodules. Normal pleural.
Airways normal. Diffuse ground-glass opacities. No pulmonary
infarction. No pneumonia.

Upper Abdomen: Limited view of the liver, kidneys, pancreas are
unremarkable. Normal adrenal glands.

Musculoskeletal: No aggressive osseous lesion.

Review of the MIP images confirms the above findings.
IMPRESSION: 1. No evidence acute pulmonary embolism.
2. Diffuse ground-glass opacities suggest mild pulmonary edema.

## 2023-02-01 ENCOUNTER — Encounter: Payer: Self-pay | Admitting: Dermatology

## 2023-02-01 ENCOUNTER — Other Ambulatory Visit: Payer: Self-pay | Admitting: Family Medicine

## 2023-02-01 DIAGNOSIS — E1169 Type 2 diabetes mellitus with other specified complication: Secondary | ICD-10-CM

## 2023-02-03 NOTE — Telephone Encounter (Signed)
Requested Prescriptions  Pending Prescriptions Disp Refills   BD PEN NEEDLE NANO 2ND GEN 32G X 4 MM MISC [Pharmacy Med Name: BD PEN NEEDLE/NANO 32GX4MM MIS] 200 each 0    Sig: USE  AS DIRECTED AT BEDTIME     Endocrinology: Diabetes - Testing Supplies Passed - 02/01/2023 12:57 PM      Passed - Valid encounter within last 12 months    Recent Outpatient Visits           1 month ago Seasonal allergic rhinitis due to pollen   [facility] Merita Norton T, FNP   4 months ago Atypical chest pain   [facility] T, FNP   5 months ago Chronic HFrEF (heart failure with reduced ejection fraction) [facility])   [facility] Merita Norton T, FNP   8 months ago Chronic HFrEF (heart failure with reduced ejection fraction) [facility])   [facility] Merita Norton T, FNP   9 months ago Chronic HFrEF (heart failure with reduced ejection fraction) [facility])   [place] [facility] Merita Norton T, FNP       Future Appointments             In 1 week Raechel Chute, MD [facility] Pulmonary Care at [place]   In 2 months Vanga, [name], MD [facility] [place] [facility] at [place]   In 5 months Deirdre Evener, MD [facility]

## 2023-02-04 ENCOUNTER — Telehealth: Payer: Self-pay | Admitting: Family Medicine

## 2023-02-04 DIAGNOSIS — E1169 Type 2 diabetes mellitus with other specified complication: Secondary | ICD-10-CM

## 2023-02-04 NOTE — Telephone Encounter (Signed)
Gabriel [name] with [facility] called to request an update on BD PEN NEEDLE NANO 2ND GEN 32G X 4 MM MISC  rx to reflect QID in directions.

## 2023-02-04 NOTE — Telephone Encounter (Signed)
Alcario Drought from [facility] requesting update on directions for Requested Prescriptions   Pending Prescriptions Disp Refills   Insulin Pen Needle (BD PEN NEEDLE NANO 2ND GEN) 32G X 4 MM MISC 200 each 0   [phone], please advise.

## 2023-02-05 ENCOUNTER — Other Ambulatory Visit: Payer: Self-pay | Admitting: Family Medicine

## 2023-02-05 ENCOUNTER — Other Ambulatory Visit: Payer: Medicaid Other | Admitting: Pharmacist

## 2023-02-05 ENCOUNTER — Telehealth: Payer: Self-pay | Admitting: Pharmacist

## 2023-02-05 MED ORDER — INSULIN PEN NEEDLE 32G X 4 MM MISC: 1.0000 | Freq: Four times a day (QID) | 11 refills | Status: DC

## 2023-02-05 NOTE — Progress Notes (Unsigned)
Patient requesting renewal of his Novolog prescription be sent to [facility] as latest Rx out of refills.  Note patient currently taking Novolog 10 units base + 1 unit correction for every 50 mg/dL above 657 prior to meals.  Thank you!  Gentry Fitz

## 2023-02-05 NOTE — Progress Notes (Signed)
02/05/2023 Name: Gabriel [name]. MRN: [id number] DOB: [birth year]  Chief Complaint  Patient presents with   Medication Management    Gabriel [name]. is a 52 y.o. year old male who presented for a telephone visit.   They were referred to the pharmacist by their PCP for assistance in managing diabetes.    Patient is participating in a Managed Medicaid Plan:  Yes, [employer]   Subjective:   Care Team: Primary Care Provider: Jacky Kindle, FNP Rheumatologist: Defoor, Lonia Farber, PA; Next Scheduled Visit: 02/07/2023 Pulmonologist: Raechel Chute, MD; Next Scheduled Visit: 02/14/2023 Heart Failure Specialist: Delma Freeze, FNP; Next Scheduled Visit: 02/17/2023 Gastroenterologist: Toney Reil, MD; Next Scheduled Visit: 04/14/2023  Medication Access/Adherence  Current Pharmacy:  [facility] [address] ([address]),  - [address] [place]) [place] [address] Phone: [phone] Fax: [phone]  [facility] - [place], [place] - [address] [place] [address] Phone: [phone] Fax: [phone]   Patient reports affordability concerns with their medications: No  Patient reports access/transportation concerns to their pharmacy: No  Patient reports adherence concerns with their medications:  No       Diabetes:   Current medications: Ozempic 2 mg weekly, Basaglar 36 units daily, Novolog 10 units base + 1 unit correction for every 50 mg/dL above 416 prior to meals; Farxiga 10 mg daily  Medications tried in the past: metformin - GI intolerance;    Patient uses Freestyle Libre 3 Continuous Glucose Monitor  Review patient's last 14-days of data from LibreView:     Patient denies recent symptoms of hypoglycemia  Patient requesting renewal of his Novolog prescription as current prescription is out of refills    Heart  Failure/Hypertension:   Current medications:  ACEi/ARB/ARNI: Entresto 97/103 mg twice daily SGLT2i: Farxiga 10 mg daily Beta blocker: carvedilol 6.25 mg twice daily Mineralocorticoid Receptor Antagonist: spironolactone 25 mg dialy Diuretic regimen: torsemide alternates taking 40 and 60 mg every other day + potassium 20 mEq every other day   Reports recent home blood pressure readings ranging 120-130/60-70  Reports occasionally has dizziness if changes positions quickly - Note on 5/13 patient referred by Heart Failure specialist to [facility], but patient denies having heard from ENT office   Objective:  Lab Results  Component Value Date   HGBA1C 6.6 (H) 01/17/2023    Lab Results  Component Value Date   CREATININE 1.65 (H) 01/17/2023   BUN 31 (H) 01/17/2023   NA 140 01/17/2023   K 4.2 01/17/2023   CL 102 01/17/2023   CO2 29 01/17/2023    Lab Results  Component Value Date   CHOL 143 08/09/2022   HDL 24 (L) 08/09/2022   LDLCALC 62 08/09/2022   LDLDIRECT 60.6 11/23/2020   TRIG 367 (H) 08/09/2022   CHOLHDL 6.0 (H) 08/09/2022    Medications Reviewed Today     Reviewed by Manuela Neptune, RPH-CPP (Pharmacist) on 02/05/23 at 1428  Med List Status: <None>   Medication Order Taking? Sig Documenting Provider Last Dose Status Informant  albuterol (VENTOLIN HFA) 108 (90 Base) MCG/ACT inhaler [id number] Yes Inhale 2 puffs into the lungs every 4 (four) hours as needed for wheezing or shortness of breath. Raechel Chute, MD Taking Active   allopurinol (ZYLOPRIM) 100 MG tablet [id number]  Take 1 tablet (100 mg total) by mouth daily. Jacky Kindle, FNP  Active   azelastine (  ASTELIN) 0.1 % nasal spray [id number]  Place 2 sprays into both nostrils 2 (two) times daily. Use in each nostril as directed Jacky Kindle, FNP  Active   BD PEN NEEDLE NANO 2ND GEN 32G X 4 MM MISC [id number]  USE  AS DIRECTED AT BEDTIME Jacky Kindle, FNP  Active   blood glucose meter kit and supplies KIT  [id number]  Dispense based on patient and insurance preference. Use up to four times daily as directed. Jacky Kindle, FNP  Active   carvedilol (COREG) 6.25 MG tablet [id number] Yes Take 1 tablet (6.25 mg total) by mouth 2 (two) times daily. Delma Freeze, FNP Taking Active   cetirizine (ZYRTEC) 10 MG tablet [id number] Yes Take 1 tablet (10 mg total) by mouth daily. Jacky Kindle, FNP Taking Active   colchicine 0.6 MG tablet [id number]  Day 1: Take 2 tablets PO for gout flare, repeat 1 tablet one hour following initial dose. Day 2: Take 1 tablet twice daily PO for an additional 2-3 days. Jacky Kindle, FNP  Active   Continuous Blood Gluc Sensor (FREESTYLE LIBRE 3 SENSOR) [place] [id number]  Place 1 sensor on the skin every 14 days. Use to check glucose continuously Erasmo Downer, MD  Active   dapagliflozin propanediol (FARXIGA) 10 MG TABS tablet [id number] Yes Take 1 tablet (10 mg total) by mouth daily. Delma Freeze, FNP Taking Active   fenofibrate (TRICOR) 145 MG tablet [id number] Yes Take 1 tablet (145 mg total) by mouth daily. Delma Freeze, FNP Taking Active   FLUoxetine (PROZAC) 20 MG capsule [id number] Yes Take 1 capsule (20 mg total) by mouth daily. Jacky Kindle, FNP Taking Active   fluticasone [facility]) 50 MCG/ACT nasal spray [id number] Yes Place 2 sprays into both nostrils daily.  Patient taking differently: Place 2 sprays into both nostrils daily as needed.   Jacky Kindle, FNP Taking Active   Furosemide (FUROSCIX) 80 MG/10ML CTKT [id number]  Inject 1 Dose into the skin as needed (edema). Bensimhon, Bevelyn Buckles, MD  Active   icosapent Ethyl (VASCEPA) 1 g capsule [id number] Yes Take 2 capsules (2 g total) by mouth 2 (two) times daily. Jacky Kindle, FNP Taking Active   insulin aspart (NOVOLOG) 100 UNIT/ML FlexPen [id number] Yes Inject 6 units + correction before breakfast/lunch and 9 units + correction before supper (correction = 1 unit per 50 mg/dL above 616); max daily dose 36 units   Patient taking differently: Inject 10 Units into the skin 3 (three) times daily with meals. Inject 10 units + correction before breakfast/lunch and supper (correction = 1 unit per 50 mg/dL above 073); max daily dose 36 units   Merita Norton T, FNP Taking Active   Insulin Glargine (BASAGLAR KWIKPEN) 100 UNIT/ML [id number] Yes Inject 36 Units into the skin at bedtime. Swayze, Ava, DO Taking Active     Discontinued 01/21/22 1612 (Change in therapy)            Med Note Raynald Kemp Feb 14, 2022  2:37 PM)    Insulin Pen Needle 32G X 4 MM MISC [id number]  1 each by Does not apply route in the morning, at noon, in the evening, and at bedtime. Jacky Kindle, FNP  Active   meclizine (ANTIVERT) 25 MG tablet [id number] Yes Take 1 tablet (25 mg total) by mouth 3 (three) times daily as needed for dizziness. Antonieta Iba, MD Taking Active   metoCLOPramide (REGLAN) 5 MG tablet  [id number] No Take 1 tablet (5 mg total) by mouth 4 (four) times daily -  before meals and at bedtime for 14 days.  Patient not taking: Reported on 02/05/2023   Toney Reil, MD Not Taking Expired 12/24/22 2359   nitroGLYCERIN (NITROSTAT) 0.4 MG SL tablet [id number]  Place 1 tablet (0.4 mg total) under the tongue every 5 (five) minutes x 3 doses as needed for chest pain. Jacky Kindle, FNP  Active   omeprazole (PRILOSEC) 40 MG capsule [id number] Yes Take 1 capsule (40 mg total) by mouth daily. Toney Reil, MD Taking Active   OZEMPIC, 2 MG/DOSE, 8 MG/3ML Namon Cirri [id number] Yes INJECT 1 DOSE (2MG ) SUBCUTANEOUSLY  ONCE A WEEK Jacky Kindle, FNP Taking Active   potassium chloride SA (KLOR-CON M) 20 MEQ tablet [id number] Yes Take 1 tablet (20 mEq total) by mouth every other day. Bensimhon, Bevelyn Buckles, MD Taking Active   rivaroxaban (XARELTO) 20 MG TABS tablet [id number] Yes Take 1 tablet (20 mg total) by mouth daily with supper. Graciella Freer, PA-C Taking Active            Med Note Clearance Coots, Alanda Slim May 30, 2022  1:36 PM)    rosuvastatin (CRESTOR) 40 MG tablet [id number] Yes Take 1 tablet (40 mg total) by mouth daily. Antonieta Iba, MD Taking Active   sacubitril-valsartan [facility]) 97-103 MG [id number] Yes Take 1 tablet by mouth 2 (two) times daily. Bensimhon, Bevelyn Buckles, MD Taking Active   spironolactone (ALDACTONE) 25 MG tablet [id number] Yes Take 1 tablet (25 mg total) by mouth daily. Delma Freeze, FNP Taking Active   torsemide (DEMADEX) 20 MG tablet [id number] Yes Take 2 tablets (40 mg total) by mouth daily. Take an extra 20 mg tablet daily as needed for edema  Patient taking differently: Take 40 mg by mouth daily. Alternating days 60 mg and 40 mg   Bensimhon, Bevelyn Buckles, MD Taking Active               Assessment/Plan:   Comprehensive medication review performed; medication list updated in electronic medical record - Patient denies any difficulty with swallowing potassium chloride tablets, but will let office/pharmacist know if has any difficulty in future - Reports Rheumatologist recently increased his allopurinol dose to 300 mg daily - Denies currently taking/needing metoclopramide, but plans to discuss further with GI specialist at upcoming appointment  Encourage patient to contact [facility] to follow up today regarding referral related to dizziness - Phone number provided  Diabetes: - Currently uncontrolled but improved  - Reviewed long term cardiovascular and renal outcomes of uncontrolled blood sugar - Reviewed goal A1c, goal fasting, and goal 2 hour post prandial glucose - Counsel patient on s/s of low blood sugar and how to treat lows Review rules of 15s - importance of using 15 grams of sugar to treat low, recheck blood sugar in 15 minutes, treat again if remains low or, if back to normal, having meal if mealtime or snack  Review examples of sources of 15 grams of sugar Encourage patient to obtain glucose tablets to carry with him - Will collaborate with PCP to  request provider send renewal of Novolog prescription to pharmacy for patient - Recommend to check glucose continuously using CGM and contact office if needed for readings outside of established parameters or for symptoms     Heart Failure: - Currently appropriately managed - Encourage patient to take positional changes slowly - Recommend patient to continue  to monitor his home blood pressure and daily weight and contact office if needed for readings outside of established parameters or for symptoms     Follow Up Plan: Clinical Pharmacist will follow up with patient by telephone again on 05/07/2023 at 8:30 AM

## 2023-02-05 NOTE — Patient Instructions (Signed)
Goals Addressed             This Visit's Progress    Pharmacy Goals       Our goal A1c is less than 7%. This corresponds with fasting sugars less than 130 and 2 hour after meal sugars less than 180. Please continue to use Freestyle Libre 3 Continuous Glucose monitor for blood sugar monitoring.  Our goal is to maintain lower triglyceride and LDL levels. This is why it is important to continue taking your rosuvastatin, fenofibrate, Vascepa in addition to exercise as able and limit dietary intake of trans and saturated fats  Estelle Grumbles, PharmD, [facility] [phone]

## 2023-02-06 ENCOUNTER — Other Ambulatory Visit: Payer: Self-pay

## 2023-02-06 DIAGNOSIS — E1169 Type 2 diabetes mellitus with other specified complication: Secondary | ICD-10-CM

## 2023-02-06 MED ORDER — INSULIN ASPART 100 UNIT/ML FLEXPEN
10.0000 [IU] | PEN_INJECTOR | Freq: Three times a day (TID) | SUBCUTANEOUS | 3 refills | Status: DC
Start: 2023-02-06 — End: 2023-03-11

## 2023-02-06 NOTE — Telephone Encounter (Signed)
Encounter turned into medication refill

## 2023-02-07 DIAGNOSIS — Z79899 Other long term (current) drug therapy: Secondary | ICD-10-CM | POA: Diagnosis not present

## 2023-02-07 DIAGNOSIS — M1A00X0 Idiopathic chronic gout, unspecified site, without tophus (tophi): Secondary | ICD-10-CM | POA: Diagnosis not present

## 2023-02-07 DIAGNOSIS — L4050 Arthropathic psoriasis, unspecified: Secondary | ICD-10-CM | POA: Diagnosis not present

## 2023-02-11 ENCOUNTER — Ambulatory Visit (INDEPENDENT_AMBULATORY_CARE_PROVIDER_SITE_OTHER): Payer: Medicaid Other | Admitting: Student in an Organized Health Care Education/Training Program

## 2023-02-11 ENCOUNTER — Encounter: Payer: Self-pay | Admitting: Student in an Organized Health Care Education/Training Program

## 2023-02-11 VITALS — BP 128/78 | HR 84 | Temp 97.6°F | Ht 71.0 in | Wt 246.0 lb

## 2023-02-11 DIAGNOSIS — R0602 Shortness of breath: Secondary | ICD-10-CM

## 2023-02-11 DIAGNOSIS — G4733 Obstructive sleep apnea (adult) (pediatric): Secondary | ICD-10-CM

## 2023-02-11 MED ORDER — ANORO ELLIPTA 62.5-25 MCG/ACT IN AEPB
1.0000 | INHALATION_SPRAY | Freq: Every day | RESPIRATORY_TRACT | 12 refills | Status: DC
Start: 2023-02-11 — End: 2023-09-16

## 2023-02-11 NOTE — Progress Notes (Signed)
Assessment & Plan:   1. Shortness of breath  Presents for follow up on shortness of breath that is multi-factorial. He has a significant cardiac history (multi-vessel CAD, HFrEF, last EF 30%), PE (Diagnosed 12/2019 on Rivaroxaban) and emphysema on imaging (history of smoking).   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 He does have a history of psoriasis (? 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 Given history of VTE, I ordered a perfusion study which returned normal ruling out CTEPH.   PFT's show a response to bronchodilators but overall spirometry is normal, as are lung volumes. DLCO is decreased at 70% predicted. He reports some improvement with albuterol during his PFT's as well as with as needed albuterol after our last visit. I will provide him with a prescription for LABA/LAMA therapy (Anoro Ellipta) today.   - umeclidinium-vilanterol (ANORO ELLIPTA) 62.5-25 MCG/ACT AEPB; Inhale 1 puff into the lungs daily.  Dispense: 30 each; Refill: 12  2. OSA on CPAP  Noted to have OSA and initiated on BiPAP. Compliance report shows poor compliance. Discussed importance of compliance. Will attempt to trial other masks and re-assess fit with DME company.   - AMB REFERRAL FOR DME   Return in about 28 weeks (around 08/26/2023).  I spent 30 minutes caring for this patient today, including preparing to see the patient, obtaining a medical history , reviewing a separately obtained history, performing a medically appropriate examination and/or evaluation, counseling and educating the patient/family/caregiver, ordering medications, tests, or procedures, documenting clinical information in the electronic health record, and independently interpreting results (not separately reported/billed) and  communicating results to the patient/family/caregiver  Raechel Chute, MD [facility] 02/11/2023 5:35 PM    End of visit medications:  Meds ordered this encounter  Medications   umeclidinium-vilanterol (ANORO ELLIPTA) 62.5-25 MCG/ACT AEPB    Sig: Inhale 1 puff into the lungs daily.    Dispense:  30 each    Refill:  12     Current Outpatient Medications:    albuterol (VENTOLIN HFA) 108 (90 Base) MCG/ACT inhaler, Inhale 2 puffs into the lungs every 4 (four) hours as needed for wheezing or shortness of breath., Disp: 8 g, Rfl: 6   allopurinol (ZYLOPRIM) 300 MG tablet, Take 300 mg by mouth daily., Disp: , Rfl:    azelastine (ASTELIN) 0.1 % nasal spray, Place 2 sprays into both nostrils 2 (two) times daily. Use in each nostril as directed, Disp: 30 mL, Rfl: 12   BD PEN NEEDLE NANO 2ND GEN 32G X 4 MM MISC, USE  AS DIRECTED AT BEDTIME, Disp: 200 each, Rfl: 0   blood glucose meter kit and supplies KIT, Dispense based on patient and insurance preference. Use up to four times daily as directed., Disp: 1 each, Rfl: 11   carvedilol (COREG) 6.25 MG tablet, Take 1 tablet (6.25 mg total) by mouth 2 (two) times daily., Disp: 180 tablet, Rfl: 3   cetirizine (ZYRTEC) 10 MG tablet, Take 1 tablet (10 mg total) by mouth daily., Disp: 30 tablet, Rfl: 11   colchicine 0.6 MG tablet, Day 1: Take 2 tablets PO for gout flare, repeat 1 tablet one hour following initial dose. Day 2: Take 1 tablet twice daily PO for an additional 2-3 days., Disp: 60 tablet, Rfl: 0   Continuous Blood  Gluc Sensor (FREESTYLE LIBRE 3 SENSOR) MISC, Place 1 sensor on the skin every 14 days. Use to check glucose continuously, Disp: 6 each, Rfl: 3   dapagliflozin propanediol (FARXIGA) 10 MG TABS tablet, Take 1 tablet (10 mg total) by mouth daily., Disp: 30 tablet, Rfl: 5   fenofibrate (TRICOR) 145 MG tablet, Take 1 tablet (145 mg total) by mouth daily., Disp: 90 tablet, Rfl: 3   FLUoxetine (PROZAC) 20 MG capsule, Take 1  capsule (20 mg total) by mouth daily., Disp: 90 capsule, Rfl: 3   fluticasone (FLONASE) 50 MCG/ACT nasal spray, Place 2 sprays into both nostrils daily. (Patient taking differently: Place 2 sprays into both nostrils daily as needed.), Disp: 16 g, Rfl: 6   Furosemide (FUROSCIX) 80 MG/10ML CTKT, Inject 1 Dose into the skin as needed (edema)., Disp: 6 each, Rfl: 0   icosapent Ethyl (VASCEPA) 1 g capsule, Take 2 capsules (2 g total) by mouth 2 (two) times daily., Disp: 360 capsule, Rfl: 1   insulin aspart (NOVOLOG) 100 UNIT/ML FlexPen, Inject 10 Units into the skin 3 (three) times daily with meals. Inject 10 units + correction before breakfast/lunch and supper (correction = 1 unit per 50 mg/dL above 578); max daily dose 36 units, Disp: 15 mL, Rfl: 3   Insulin Pen Needle 32G X 4 MM MISC, 1 each by Does not apply route in the morning, at noon, in the evening, and at bedtime., Disp: 120 each, Rfl: 11   meclizine (ANTIVERT) 25 MG tablet, Take 1 tablet (25 mg total) by mouth 3 (three) times daily as needed for dizziness., Disp: 60 tablet, Rfl: 1   nitroGLYCERIN (NITROSTAT) 0.4 MG SL tablet, Place 1 tablet (0.4 mg total) under the tongue every 5 (five) minutes x 3 doses as needed for chest pain., Disp: 30 tablet, Rfl: 0   omeprazole (PRILOSEC) 40 MG capsule, Take 1 capsule (40 mg total) by mouth daily., Disp: 30 capsule, Rfl: 4   OZEMPIC, 2 MG/DOSE, 8 MG/3ML SOPN, INJECT 1 DOSE (2MG ) SUBCUTANEOUSLY  ONCE A WEEK, Disp: 9 mL, Rfl: 0   potassium chloride SA (KLOR-CON M) 20 MEQ tablet, Take 1 tablet (20 mEq total) by mouth every other day., Disp: 15 tablet, Rfl: 6   rivaroxaban (XARELTO) 20 MG TABS tablet, Take 1 tablet (20 mg total) by mouth daily with supper., Disp: 90 tablet, Rfl: 3   rosuvastatin (CRESTOR) 40 MG tablet, Take 1 tablet (40 mg total) by mouth daily., Disp: 90 tablet, Rfl: 3   sacubitril-valsartan (ENTRESTO) 97-103 MG, Take 1 tablet by mouth 2 (two) times daily., Disp: 60 tablet, Rfl: 6    spironolactone (ALDACTONE) 25 MG tablet, Take 1 tablet (25 mg total) by mouth daily., Disp: 90 tablet, Rfl: 3   torsemide (DEMADEX) 20 MG tablet, Take 2 tablets (40 mg total) by mouth daily. Take an extra 20 mg tablet daily as needed for edema (Patient taking differently: Take 40 mg by mouth daily. Alternating days 60 mg and 40 mg), Disp: 75 tablet, Rfl: 6   umeclidinium-vilanterol (ANORO ELLIPTA) 62.5-25 MCG/ACT AEPB, Inhale 1 puff into the lungs daily., Disp: 30 each, Rfl: 12   Insulin Glargine (BASAGLAR KWIKPEN) 100 UNIT/ML, Inject 36 Units into the skin at bedtime., Disp: 30 mL, Rfl: 3   metoCLOPramide (REGLAN) 5 MG tablet, Take 1 tablet (5 mg total) by mouth 4 (four) times daily -  before meals and at bedtime for 14 days. (Patient not taking: Reported on 02/05/2023), Disp: 56 tablet, Rfl: 0  Subjective:   PATIENT ID: Gabriel [name]. GENDER: male DOB: 1971/08/04, MRN: [id number]  Chief Complaint  Patient presents with   Follow-up    SOB and occ dry cough. Wearing cpap nightly- feels pressure is not strong enough.     HPI  Mr. Manalang is a pleasant 52 year old male patient with a past medical history most notable for CAD, heart failure, diabetes, hypertension, PE on Xarelto, and OSA who is presenting to clinic for follow up.  Patient reports that he has been short of breath with exertion since 2016 when he had his heart attack.  Over the past 8 years he has been experiencing exertional dyspnea with minimal activity. He reports an occasional cough that is dry and nonproductive.  He sometimes feels chest tightness but denies any wheezing.  He has had multiple hospitalizations for decompensations of his heart failure. He has been closely followed by cardiology and is on an aggressive regimen for the management of heart failure as well as on Rivaroxaban for pulmonary embolism. He has been seen by our heart failure team as well.  He is presenting today for follow up. He's had his PFT's, and has  tried as needed albuterol, which he feels helps a lot with symptoms. He also had a sleep study (split night with titration) and we started him on BiPAP (Auto-IPAP, EPAP of 12, no back up rate). Compliance report reviewed (63% usage days, 27%-8 days of more than 4 hours and 11 days < 4 hours). His AHI was 10 with therapy. He feels that the mask migrates over his face, and he is having difficulty tolerating it.   He had a RHC in October of 2021 while hospitalized for heart failure showing RA 9, RV 57/10, PA 57/28 (40), PCWP 17, CO 3.1 and CI of 1.3. PVR calculated at 7.4 [place].   He was previously diagnosed with OSA with a sleep study in 2021 - at that time he had an AHI of 100 requiring a split night sleep study with titration. At that time, he required BiPAP pressures of 26/22 to improve his AHI. He has underwent multiple CT scans of the chest with the report noting emphysema.  Patient is not on any inhalers but is on multiple medications for the management of his cardiac condition.   He does have a history of smoking and reports having smoked at least 2 packs a day for over 40 years. 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  Ancillary information including prior medications, full medical/surgical/family/social histories, and PFTs (when available) are listed below and have been reviewed.   Review of Systems  Constitutional:  Negative for chills, fever and weight loss.  Respiratory:  Positive for shortness of breath. Negative for cough, hemoptysis, sputum production and wheezing.   Cardiovascular:  Negative for chest pain.  Skin:  Negative for rash.     Objective:   Vitals:   02/11/23 1030  BP: 128/78  Pulse: 84  Temp: 97.6 F (36.4 C)  TempSrc: Temporal  SpO2: 95%  Weight: 246 lb (111.6 kg)  Height: 5\' 11"  (1.803 m)   95% on RA BMI Readings from Last 3 Encounters:  02/11/23 34.31 kg/m  01/17/23 34.48 kg/m   12/30/22 34.39 kg/m   Wt Readings from Last 3 Encounters:  02/11/23 246 lb (111.6 kg)  01/17/23 247 lb 3.2 oz (112.1 kg)  12/30/22 246 lb 9.6 oz (111.9 kg)    Physical Exam  Constitutional:      Appearance: He is obese. He is not ill-appearing.  HENT:     Nose: Nose normal.     Mouth/Throat:     Mouth: Mucous membranes are moist.  Neck:     Comments: Thick neck, facial hair Cardiovascular:     Rate and Rhythm: Normal rate and regular rhythm.     Pulses: Normal pulses.     Heart sounds: Normal heart sounds.  Pulmonary:     Effort: Pulmonary effort is normal.     Breath sounds: No wheezing or rales (faint bibasilar).  Abdominal:     General: There is distension.     Palpations: Abdomen is soft.  Neurological:     General: No focal deficit present.     Mental Status: He is alert and oriented to person, place, and time. Mental status is at baseline.       Ancillary Information    Past Medical History:  Diagnosis Date   CAD (coronary artery disease)    a. 04/2015 low risk MV;  b. 12/2016 Cath: minor irregs in LAD/Diag/LCX/OM, RCA 40p/m/d; c. 05/2020 Cath: LM nl, LAD 50d, LCX 30p, OM1 40, RCA 100p w/ L->R collats. CO/CI 3.1/1.3-->Med rx.   Chest wall pain, chronic    Chronic Troponin Elevation    CKD (chronic kidney disease), stage II-III    COPD (chronic obstructive pulmonary disease) (HCC)    Diabetes mellitus without complication (HCC)    HFrEF (heart failure with reduced ejection fraction) (HCC)    a. 03/2015 Echo: EF 45-50%; b. 12/2015 Echo: EF 20-25%; c. 02/2016 Echo: EF 30-35%; d. 11/2016 Echo: EF 40-45%; e. 06/2019 Echo: EF 30-35%; f. 11/2019 Echo: EF 25-30%; g. 05/2020 Echo: EF 35-40%; h. 05/2021 Echo: EF 40-45%; i. 11/2021 Echo: EF 20-25%, glob HK, mild LVH, GrIII DD, sev red RV fxn, mild MR.   Hypertension    Mitral regurgitation    Mild to moderate by October 2021 echocardiogram.   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 03/2015 Echo: EF  45-50%; b. 12/2015 Echo: EF 20-25%; c. 02/2016 Echo: EF 30-35%; d. 11/2016 Echo: EF 40-45%; e. 06/2019 Echo: EF 30-35%; f. 11/2019 Echo: EF 25-30%; g. 05/2020 Echo: EF 35-40%; h. 05/2021 Echo: EF 40-45%; i. 11/2021 Echo: EF 20-25%   Myocardial infarct (HCC)    NSVT (nonsustained ventricular tachycardia) (HCC)    a. 12/2015 noted on tele-->amio;  b. 12/2015 Event monitor: no VT noted.   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  12/31/19: RUL and RLL PEs.   Syncope    a. 01/2016 - felt to be vasovagal.     Family History  Problem Relation Age of Onset   Diabetes Mother    Diabetes Mellitus II Mother    Hypothyroidism Mother    Hypertension Mother    Kidney failure Mother        Dialysis   Heart attack Mother        77 yo approximately   Hypertension Father    Gout Father    Cancer Maternal Grandfather    Diabetes Maternal Grandfather    Cancer Paternal Aunt      Past Surgical History:  Procedure Laterality Date   AMPUTATION     CARDIAC CATHETERIZATION     COLONOSCOPY WITH PROPOFOL N/A 08/01/2022   Procedure: COLONOSCOPY WITH PROPOFOL;  Surgeon: Toney Reil, MD;  Location[facility]  ENDOSCOPY;  Service: Gastroenterology;  Laterality: N/A;   ESOPHAGOGASTRODUODENOSCOPY (EGD) WITH PROPOFOL N/A 08/01/2022   Procedure: ESOPHAGOGASTRODUODENOSCOPY (EGD) WITH PROPOFOL;  Surgeon: Toney Reil, MD;  Location: [facility] ENDOSCOPY;  Service: Gastroenterology;  Laterality: N/A;   ESOPHAGOGASTRODUODENOSCOPY (EGD) WITH PROPOFOL N/A 10/16/2022   Procedure: ESOPHAGOGASTRODUODENOSCOPY (EGD) WITH PROPOFOL;  Surgeon: Toney Reil, MD;  Location: [facility] ENDOSCOPY;  Service: Gastroenterology;  Laterality: N/A;   FINGER AMPUTATION     Traumatic   FINGER FRACTURE SURGERY Left    FRACTURE SURGERY     LEFT HEART CATH AND CORONARY ANGIOGRAPHY N/A 01/06/2017   Procedure: Left Heart Cath and Coronary Angiography;  Surgeon: Iran Ouch, MD;  Location: [facility] INVASIVE CV LAB;  Service: Cardiovascular;  Laterality: N/A;   RIGHT/LEFT HEART CATH AND CORONARY ANGIOGRAPHY N/A 06/13/2020   Procedure: RIGHT/LEFT HEART CATH AND CORONARY ANGIOGRAPHY;  Surgeon: Yvonne Kendall, MD;  Location: [facility] INVASIVE CV LAB;  Service: Cardiovascular;  Laterality: N/A;   SUBQ ICD IMPLANT N/A 05/20/2022   Procedure: SUBQ ICD IMPLANT;  Surgeon: Lanier Prude, MD;  Location: [facility] INVASIVE CV LAB;  Service: Cardiovascular;  Laterality: N/A;    Social History   Socioeconomic History   Marital status: Widowed    Spouse name: Not on file   Number of children: 1   Years of education: 47   Highest education level: 12th grade  Occupational History   Occupation: disabled  Tobacco Use   Smoking status: Former    Packs/day: 0.50    Years: 33.00    Additional pack years: 0.00    Total pack years: 16.50    Types: Cigarettes    Quit date: 05/08/2020    Years since quitting: 2.7   Smokeless tobacco: Former    Quit date: 05/08/2020   Tobacco comments:    Quit Sept 2021  Vaping Use   Vaping Use: Former  Substance and Sexual Activity   Alcohol use: Not Currently    Comment: occassionally   Drug use: No   Sexual activity: Not Currently  Other Topics Concern   Not on file  Social History Narrative   Lives at home with wife and his dad's wife.        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    Social Determinants of Health   Financial Resource Strain: Medium Risk (08/05/2022)   Overall Financial Resource Strain (CARDIA)    Difficulty of Paying Living Expenses: Somewhat hard  Food Insecurity: No Food Insecurity (08/15/2022)   Hunger Vital Sign    Worried About Running Out of Food in the Last Year: Never true    Ran Out of Food in the Last Year: Never true  Transportation Needs: Unmet Transportation Needs (08/15/2022)   PRAPARE - Transportation    Lack of  Transportation (Medical): Yes    Lack of Transportation (Non-Medical): Yes  Physical Activity: Insufficiently Active (01/20/2018)   Exercise Vital Sign    Days of Exercise per Week: 7 days    Minutes of Exercise per Session: 10 min  Stress: Stress Concern Present (09/02/2017)   [name] of Occupational Health - Occupational Stress Questionnaire    Feeling of Stress : Very much  Social Connections: Moderately Isolated (09/02/2017)   Social Connection and Isolation Panel [NHANES]    Frequency of Communication with Friends and Family: Twice a week    Frequency of Social Gatherings with Friends and Family: Once a week  Attends Religious Services: Never    Active Member of Clubs or Organizations: No    Attends Banker Meetings: Never    Marital Status: Widowed  Intimate Partner Violence: Not At Risk (05/14/2022)   Humiliation, Afraid, Rape, and Kick questionnaire    Fear of Current or Ex-Partner: No    Emotionally Abused: No    Physically Abused: No    Sexually Abused: No     Allergies  Allergen Reactions   Metformin And Related Nausea And Vomiting   Zantac [Ranitidine Hcl] Diarrhea and Nausea Only    Night sweats     CBC    Component Value Date/Time   WBC 8.8 12/21/2022 0859   RBC 6.58 (H) 12/21/2022 0859   HGB 18.2 (H) 12/21/2022 0859   HGB 17.0 11/05/2021 1403   HCT 55.5 (H) 12/21/2022 0859   HCT 51.9 (H) 11/05/2021 1403   PLT 209 12/21/2022 0859   PLT 170 11/05/2021 1403   MCV 84.3 12/21/2022 0859   MCV 86 11/05/2021 1403   MCV 85 10/07/2013 1703   MCH 27.7 12/21/2022 0859   MCHC 32.8 12/21/2022 0859   RDW 14.2 12/21/2022 0859   RDW 14.3 11/05/2021 1403   RDW 14.6 (H) 10/07/2013 1703   LYMPHSABS 2.9 12/21/2022 0859   LYMPHSABS 1.8 11/05/2021 1403   LYMPHSABS 2.3 08/26/2012 1200   MONOABS 0.6 12/21/2022 0859   MONOABS 0.9 08/26/2012 1200   EOSABS 0.2 12/21/2022 0859   EOSABS 0.1 11/05/2021 1403   EOSABS 0.1 08/26/2012 1200   BASOSABS 0.1  12/21/2022 0859   BASOSABS 0.1 11/05/2021 1403   BASOSABS 0.1 08/26/2012 1200    Pulmonary Functions Testing Results:    Latest Ref Rng & Units 10/17/2022    8:14 AM  PFT Results  FVC-Pre L 4.38   FVC-Predicted Pre % 85   FVC-Post L 4.50   FVC-Predicted Post % 87   Pre FEV1/FVC % % 83   Post FEV1/FCV % % 85   FEV1-Pre L 3.61   FEV1-Predicted Pre % 91   FEV1-Post L 3.83   DLCO uncorrected ml/min/mmHg 20.99   DLCO UNC% % 70   DLVA Predicted % 69   TLC L 6.58   TLC % Predicted % 91   RV % Predicted % 87     Outpatient Medications Prior to Visit  Medication Sig Dispense Refill   albuterol (VENTOLIN HFA) 108 (90 Base) MCG/ACT inhaler Inhale 2 puffs into the lungs every 4 (four) hours as needed for wheezing or shortness of breath. 8 g 6   allopurinol (ZYLOPRIM) 300 MG tablet Take 300 mg by mouth daily.     azelastine (ASTELIN) 0.1 % nasal spray Place 2 sprays into both nostrils 2 (two) times daily. Use in each nostril as directed 30 mL 12   BD PEN NEEDLE NANO 2ND GEN 32G X 4 MM MISC USE  AS DIRECTED AT BEDTIME 200 each 0   blood glucose meter kit and supplies KIT Dispense based on patient and insurance preference. Use up to four times daily as directed. 1 each 11   carvedilol (COREG) 6.25 MG tablet Take 1 tablet (6.25 mg total) by mouth 2 (two) times daily. 180 tablet 3   cetirizine (ZYRTEC) 10 MG tablet Take 1 tablet (10 mg total) by mouth daily. 30 tablet 11   colchicine 0.6 MG tablet Day 1: Take 2 tablets PO for gout flare, repeat 1 tablet one hour following initial dose. Day 2: Take 1 tablet twice daily  PO for an additional 2-3 days. 60 tablet 0   Continuous Blood Gluc Sensor (FREESTYLE LIBRE 3 SENSOR) MISC Place 1 sensor on the skin every 14 days. Use to check glucose continuously 6 each 3   dapagliflozin propanediol (FARXIGA) 10 MG TABS tablet Take 1 tablet (10 mg total) by mouth daily. 30 tablet 5   fenofibrate (TRICOR) 145 MG tablet Take 1 tablet (145 mg total) by mouth daily.  90 tablet 3   FLUoxetine (PROZAC) 20 MG capsule Take 1 capsule (20 mg total) by mouth daily. 90 capsule 3   fluticasone (FLONASE) 50 MCG/ACT nasal spray Place 2 sprays into both nostrils daily. (Patient taking differently: Place 2 sprays into both nostrils daily as needed.) 16 g 6   Furosemide (FUROSCIX) 80 MG/10ML CTKT Inject 1 Dose into the skin as needed (edema). 6 each 0   icosapent Ethyl (VASCEPA) 1 g capsule Take 2 capsules (2 g total) by mouth 2 (two) times daily. 360 capsule 1   insulin aspart (NOVOLOG) 100 UNIT/ML FlexPen Inject 10 Units into the skin 3 (three) times daily with meals. Inject 10 units + correction before breakfast/lunch and supper (correction = 1 unit per 50 mg/dL above 409); max daily dose 36 units 15 mL 3   Insulin Pen Needle 32G X 4 MM MISC 1 each by Does not apply route in the morning, at noon, in the evening, and at bedtime. 120 each 11   meclizine (ANTIVERT) 25 MG tablet Take 1 tablet (25 mg total) by mouth 3 (three) times daily as needed for dizziness. 60 tablet 1   nitroGLYCERIN (NITROSTAT) 0.4 MG SL tablet Place 1 tablet (0.4 mg total) under the tongue every 5 (five) minutes x 3 doses as needed for chest pain. 30 tablet 0   omeprazole (PRILOSEC) 40 MG capsule Take 1 capsule (40 mg total) by mouth daily. 30 capsule 4   OZEMPIC, 2 MG/DOSE, 8 MG/3ML SOPN INJECT 1 DOSE (2MG ) SUBCUTANEOUSLY  ONCE A WEEK 9 mL 0   potassium chloride SA (KLOR-CON M) 20 MEQ tablet Take 1 tablet (20 mEq total) by mouth every other day. 15 tablet 6   rivaroxaban (XARELTO) 20 MG TABS tablet Take 1 tablet (20 mg total) by mouth daily with supper. 90 tablet 3   rosuvastatin (CRESTOR) 40 MG tablet Take 1 tablet (40 mg total) by mouth daily. 90 tablet 3   sacubitril-valsartan (ENTRESTO) 97-103 MG Take 1 tablet by mouth 2 (two) times daily. 60 tablet 6   spironolactone (ALDACTONE) 25 MG tablet Take 1 tablet (25 mg total) by mouth daily. 90 tablet 3   torsemide (DEMADEX) 20 MG tablet Take 2 tablets  (40 mg total) by mouth daily. Take an extra 20 mg tablet daily as needed for edema (Patient taking differently: Take 40 mg by mouth daily. Alternating days 60 mg and 40 mg) 75 tablet 6   Insulin Glargine (BASAGLAR KWIKPEN) 100 UNIT/ML Inject 36 Units into the skin at bedtime. 30 mL 3   metoCLOPramide (REGLAN) 5 MG tablet Take 1 tablet (5 mg total) by mouth 4 (four) times daily -  before meals and at bedtime for 14 days. (Patient not taking: Reported on 02/05/2023) 56 tablet 0   No facility-administered medications prior to visit.

## 2023-02-14 ENCOUNTER — Ambulatory Visit: Payer: Medicaid Other | Admitting: Student in an Organized Health Care Education/Training Program

## 2023-02-17 ENCOUNTER — Encounter: Payer: Medicaid Other | Admitting: Family

## 2023-02-18 ENCOUNTER — Ambulatory Visit: Payer: Medicaid Other

## 2023-02-18 DIAGNOSIS — I428 Other cardiomyopathies: Secondary | ICD-10-CM

## 2023-02-18 LAB — CUP PACEART REMOTE DEVICE CHECK
Battery Remaining Percentage: 92 %
Date Time Interrogation Session: 20240702102500
Implantable Lead Connection Status: 753985
Implantable Lead Implant Date: 20231002
Implantable Lead Location: 753862
Implantable Lead Model: 3501
Implantable Lead Serial Number: 221766
Implantable Pulse Generator Implant Date: 20231002
Pulse Gen Serial Number: 189685

## 2023-02-19 ENCOUNTER — Encounter: Payer: Self-pay | Admitting: Family

## 2023-02-19 ENCOUNTER — Ambulatory Visit: Payer: Medicaid Other | Attending: Family | Admitting: Family

## 2023-02-19 VITALS — BP 116/75 | HR 91 | Wt 249.6 lb

## 2023-02-19 DIAGNOSIS — I2510 Atherosclerotic heart disease of native coronary artery without angina pectoris: Secondary | ICD-10-CM | POA: Diagnosis not present

## 2023-02-19 DIAGNOSIS — I428 Other cardiomyopathies: Secondary | ICD-10-CM | POA: Diagnosis not present

## 2023-02-19 DIAGNOSIS — R42 Dizziness and giddiness: Secondary | ICD-10-CM | POA: Diagnosis not present

## 2023-02-19 DIAGNOSIS — Z5986 Financial insecurity: Secondary | ICD-10-CM | POA: Insufficient documentation

## 2023-02-19 DIAGNOSIS — G4733 Obstructive sleep apnea (adult) (pediatric): Secondary | ICD-10-CM | POA: Insufficient documentation

## 2023-02-19 DIAGNOSIS — Z794 Long term (current) use of insulin: Secondary | ICD-10-CM | POA: Insufficient documentation

## 2023-02-19 DIAGNOSIS — I5022 Chronic systolic (congestive) heart failure: Secondary | ICD-10-CM | POA: Insufficient documentation

## 2023-02-19 DIAGNOSIS — Z95810 Presence of automatic (implantable) cardiac defibrillator: Secondary | ICD-10-CM | POA: Insufficient documentation

## 2023-02-19 DIAGNOSIS — Z79899 Other long term (current) drug therapy: Secondary | ICD-10-CM | POA: Diagnosis not present

## 2023-02-19 DIAGNOSIS — I2699 Other pulmonary embolism without acute cor pulmonale: Secondary | ICD-10-CM | POA: Diagnosis not present

## 2023-02-19 DIAGNOSIS — I255 Ischemic cardiomyopathy: Secondary | ICD-10-CM | POA: Diagnosis not present

## 2023-02-19 DIAGNOSIS — Z87891 Personal history of nicotine dependence: Secondary | ICD-10-CM | POA: Diagnosis not present

## 2023-02-19 DIAGNOSIS — Z7901 Long term (current) use of anticoagulants: Secondary | ICD-10-CM | POA: Insufficient documentation

## 2023-02-19 DIAGNOSIS — I130 Hypertensive heart and chronic kidney disease with heart failure and stage 1 through stage 4 chronic kidney disease, or unspecified chronic kidney disease: Secondary | ICD-10-CM | POA: Diagnosis not present

## 2023-02-19 DIAGNOSIS — J4489 Other specified chronic obstructive pulmonary disease: Secondary | ICD-10-CM | POA: Diagnosis not present

## 2023-02-19 DIAGNOSIS — Z8249 Family history of ischemic heart disease and other diseases of the circulatory system: Secondary | ICD-10-CM | POA: Insufficient documentation

## 2023-02-19 DIAGNOSIS — N189 Chronic kidney disease, unspecified: Secondary | ICD-10-CM | POA: Diagnosis not present

## 2023-02-19 DIAGNOSIS — Z5982 Transportation insecurity: Secondary | ICD-10-CM | POA: Insufficient documentation

## 2023-02-19 DIAGNOSIS — Z86711 Personal history of pulmonary embolism: Secondary | ICD-10-CM | POA: Insufficient documentation

## 2023-02-19 DIAGNOSIS — I25118 Atherosclerotic heart disease of native coronary artery with other forms of angina pectoris: Secondary | ICD-10-CM | POA: Diagnosis not present

## 2023-02-19 DIAGNOSIS — I10 Essential (primary) hypertension: Secondary | ICD-10-CM | POA: Diagnosis not present

## 2023-02-19 DIAGNOSIS — Z833 Family history of diabetes mellitus: Secondary | ICD-10-CM | POA: Insufficient documentation

## 2023-02-19 DIAGNOSIS — N1832 Chronic kidney disease, stage 3b: Secondary | ICD-10-CM

## 2023-02-19 DIAGNOSIS — Z7984 Long term (current) use of oral hypoglycemic drugs: Secondary | ICD-10-CM | POA: Insufficient documentation

## 2023-02-19 DIAGNOSIS — E1122 Type 2 diabetes mellitus with diabetic chronic kidney disease: Secondary | ICD-10-CM | POA: Insufficient documentation

## 2023-02-19 NOTE — Patient Instructions (Signed)
Was good to see you today! Have a safe 4th of July!

## 2023-02-19 NOTE — Progress Notes (Signed)
PCP: Merita Norton, FNP (last seen 05/24) Primary Cardiologist: Julien Nordmann, MD (last seen 05/24) HF cardiologist: Arvilla Meres, MD (last seen 01/24)  HPI:  Gabriel [name] is a 52 y/o male with a history of HTN, diabetes, COPD, CKD, asthma, NSVT, OSA, PE, pulmonary HTN, psoriasis, cirrhosis from NASH, ICD placement (10/23), previous tobacco use and chronic heart failure.    Echo 08/14/22: EF 40-45% with mild LVH, Grade I DD, severe hypokinesis of left ventricular, entire inferior and inferolateral wall. Echo 02/06/22: EF of 30-35% along with mild/moderate AR. Echo 11/24/21: EF of 20-25% along with mild LVH and mild Gabriel. Echo 05/21/21: EF of 40-45% along with mild LVH and mild Gabriel. Echo 05/21/21: EF of 40-45% along with mild LVH and mild Gabriel. Echo 06/08/20: EF of 35-40% along with mildly elevated PA pressure of 44.7 mmHg, moderate LAE and mild/moderate Gabriel. Echo 11/23/19: EF of 25-30% with mild LVH and moderately elevated PA Pressure. Echo 06/26/2019: EF of 30-35% along with moderately elevated PA pressure. Echo 04/29/17: EF 45-50%. Echo 11/28/16: EF of 40-45% along with mild Gabriel. EF has improved from 30-35% July 2017.   RHC/LHC done 06/13/20 and showed: Severe single-vessel coronary artery disease with chronic total occlusion of the proximal RCA.  The distal vessel fills via left to right collaterals. Mild to moderate, nonobstructive CAD involving the LAD and LCx with up to 50% stenosis. Mildly elevated left and right heart filling pressures. Moderate pulmonary hypertension (mean PAP 40 mmHg) with significantly elevated pulmonary vascular resistance (PVR 7.4 WU). Severely reduced Fick cardiac output/index.   Was in the ED 12/21/22 due to dizziness & chest pain. Admitted 05/20/22 for ICD placement and discharged the next day.   He presents today for a HF follow-up visit with a chief complaint of minimal SOB with moderate exertion. Chronic in nature. Has associated fatigue, dizziness and stable angina along with  this. Denies cough, palpitations, abdominal distention, pedal edema, difficulty sleeping or weight gain.   At last visit, carvedilol was increased to 6.25mg  BID. Wearing his CPAP nightly but having difficulty with mask fitting correctly and sometimes he has to take it off because it causes so much pressure on his face. He says that when he's able to wear it all night, he sleeps well and wakes up feeling good.   Has upcoming appt with ENT on 02/24/23 to address his dizziness. Said that it worsened on 02/15/23 but some reason but says that he has no warning that it's going to get worse. Taking meclizine which does help.   ROS: All systems negative except as listed in HPI, PMH and Problem List.  SH:  Social History   Socioeconomic History   Marital status: Widowed    Spouse name: Not on file   Number of children: 1   Years of education: 78   Highest education level: 12th grade  Occupational History   Occupation: disabled  Tobacco Use   Smoking status: Former    Packs/day: 0.50    Years: 33.00    Additional pack years: 0.00    Total pack years: 16.50    Types: Cigarettes    Quit date: 05/08/2020    Years since quitting: 2.7   Smokeless tobacco: Former    Quit date: 05/08/2020   Tobacco comments:    Quit Sept 2021  Vaping Use   Vaping Use: Former  Substance and Sexual Activity   Alcohol use: Not Currently    Comment: occassionally   Drug use: No   Sexual activity:  Not Currently  Other Topics Concern   Not on file  Social History Narrative   Lives at home with wife and his dad's wife.        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    Social Determinants of Health   Financial Resource Strain: Medium Risk (08/05/2022)   Overall Financial Resource Strain (CARDIA)    Difficulty of Paying Living Expenses: Somewhat hard  Food Insecurity: No Food Insecurity (08/15/2022)   Hunger Vital Sign    Worried About Running Out  of Food in the Last Year: Never true    Ran Out of Food in the Last Year: Never true  Transportation Needs: Unmet Transportation Needs (08/15/2022)   PRAPARE - Transportation    Lack of Transportation (Medical): Yes    Lack of Transportation (Non-Medical): Yes  Physical Activity: Insufficiently Active (01/20/2018)   Exercise Vital Sign    Days of Exercise per Week: 7 days    Minutes of Exercise per Session: 10 min  Stress: Stress Concern Present (09/02/2017)   [name] of Occupational Health - Occupational Stress Questionnaire    Feeling of Stress : Very much  Social Connections: Moderately Isolated (09/02/2017)   Social Connection and Isolation Panel [NHANES]    Frequency of Communication with Friends and Family: Twice a week    Frequency of Social Gatherings with Friends and Family: Once a week    Attends Religious Services: Never    Database administrator or Organizations: No    Attends Banker Meetings: Never    Marital Status: Widowed  Intimate Partner Violence: Not At Risk (05/14/2022)   Humiliation, Afraid, Rape, and Kick questionnaire    Fear of Current or Ex-Partner: No    Emotionally Abused: No    Physically Abused: No    Sexually Abused: No    FH:  Family History  Problem Relation Age of Onset   Diabetes Mother    Diabetes Mellitus II Mother    Hypothyroidism Mother    Hypertension Mother    Kidney failure Mother        Dialysis   Heart attack Mother        78 yo approximately   Hypertension Father    Gout Father    Cancer Maternal Grandfather    Diabetes Maternal Grandfather    Cancer Paternal Aunt     Past Medical History:  Diagnosis Date   CAD (coronary artery disease)    a. 04/2015 low risk MV;  b. 12/2016 Cath: minor irregs in LAD/Diag/LCX/OM, RCA 40p/m/d; c. 05/2020 Cath: LM nl, LAD 50d, LCX 30p, OM1 40, RCA 100p w/ L->R collats. CO/CI 3.1/1.3-->Med rx.   Chest wall pain, chronic    Chronic Troponin Elevation    CKD (chronic kidney  disease), stage II-III    COPD (chronic obstructive pulmonary disease) (HCC)    Diabetes mellitus without complication (HCC)    HFrEF (heart failure with reduced ejection fraction) (HCC)    a. 03/2015 Echo: EF 45-50%; b. 12/2015 Echo: EF 20-25%; c. 02/2016 Echo: EF 30-35%; d. 11/2016 Echo: EF 40-45%; e. 06/2019 Echo: EF 30-35%; f. 11/2019 Echo: EF 25-30%; g. 05/2020 Echo: EF 35-40%; h. 05/2021 Echo: EF 40-45%; i. 11/2021 Echo: EF 20-25%, glob HK, mild LVH, GrIII DD, sev red RV fxn, mild Gabriel.   Hypertension    Mitral regurgitation    Mild to moderate by October 2021 echocardiogram.   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 03/2015 Echo: EF 45-50%; b. 12/2015 Echo: EF 20-25%; c. 02/2016 Echo: EF 30-35%; d. 11/2016 Echo: EF 40-45%; e. 06/2019 Echo: EF 30-35%; f. 11/2019 Echo: EF 25-30%; g. 05/2020 Echo: EF 35-40%; h. 05/2021 Echo: EF 40-45%; i. 11/2021 Echo: EF 20-25%   Myocardial infarct (HCC)    NSVT (nonsustained ventricular tachycardia) (HCC)    a. 12/2015 noted on tele-->amio;  b. 12/2015 Event monitor: no VT noted.   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  12/31/19: RUL and RLL PEs.   Syncope    a. 01/2016 - felt to be vasovagal.    Current Outpatient Medications  Medication Sig Dispense Refill   albuterol (VENTOLIN HFA) 108 (90 Base) MCG/ACT inhaler Inhale 2 puffs into the lungs every 4 (four) hours as needed for wheezing or shortness of breath. 8 g 6   allopurinol (ZYLOPRIM) 300 MG tablet Take 300 mg by mouth daily.     azelastine (ASTELIN) 0.1 % nasal spray Place 2 sprays into both nostrils 2 (two) times daily. Use in each nostril as directed 30 mL 12   BD PEN NEEDLE NANO 2ND GEN 32G X 4 MM MISC USE  AS DIRECTED AT BEDTIME 200 each 0   blood glucose meter kit and supplies KIT Dispense based on patient and insurance preference. Use up to four times daily as directed. 1 each 11   carvedilol (COREG)  6.25 MG tablet Take 1 tablet (6.25 mg total) by mouth 2 (two) times daily. 180 tablet 3   cetirizine (ZYRTEC) 10 MG tablet Take 1 tablet (10 mg total) by mouth daily. 30 tablet 11   colchicine 0.6 MG tablet Day 1: Take 2 tablets PO for gout flare, repeat 1 tablet one hour following initial dose. Day 2: Take 1 tablet twice daily PO for an additional 2-3 days. 60 tablet 0   Continuous Blood Gluc Sensor (FREESTYLE LIBRE 3 SENSOR) MISC Place 1 sensor on the skin every 14 days. Use to check glucose continuously 6 each 3   dapagliflozin propanediol (FARXIGA) 10 MG TABS tablet Take 1 tablet (10 mg total) by mouth daily. 30 tablet 5   fenofibrate (TRICOR) 145 MG tablet Take 1 tablet (145 mg total) by mouth daily. 90 tablet 3   FLUoxetine (PROZAC) 20 MG capsule Take 1 capsule (20 mg total) by mouth daily. 90 capsule 3   fluticasone (FLONASE) 50 MCG/ACT nasal spray Place 2 sprays into both nostrils daily. (Patient taking differently: Place 2 sprays into both nostrils daily as needed.) 16 g 6   Furosemide (FUROSCIX) 80 MG/10ML CTKT Inject 1 Dose into the skin as needed (edema). 6 each 0   icosapent Ethyl (VASCEPA) 1 g capsule Take 2 capsules (2 g total) by mouth 2 (two) times daily. 360 capsule 1   insulin aspart (NOVOLOG) 100 UNIT/ML FlexPen Inject 10 Units into the skin 3 (three) times daily with meals. Inject 10 units + correction before breakfast/lunch and supper (correction = 1 unit per 50 mg/dL above 147); max daily dose 36 units 15 mL 3   Insulin Glargine (BASAGLAR KWIKPEN) 100 UNIT/ML Inject 36 Units into the skin at bedtime. 30 mL 3   Insulin Pen Needle 32G X 4 MM MISC 1 each by Does not apply route in the morning, at noon, in the evening, and at bedtime. 120 each 11   meclizine (ANTIVERT) 25 MG tablet Take  1 tablet (25 mg total) by mouth 3 (three) times daily as needed for dizziness. 60 tablet 1   metoCLOPramide (REGLAN) 5 MG tablet Take 1 tablet (5 mg total) by mouth 4 (four) times daily -  before  meals and at bedtime for 14 days. (Patient not taking: Reported on 02/05/2023) 56 tablet 0   nitroGLYCERIN (NITROSTAT) 0.4 MG SL tablet Place 1 tablet (0.4 mg total) under the tongue every 5 (five) minutes x 3 doses as needed for chest pain. 30 tablet 0   omeprazole (PRILOSEC) 40 MG capsule Take 1 capsule (40 mg total) by mouth daily. 30 capsule 4   OZEMPIC, 2 MG/DOSE, 8 MG/3ML SOPN INJECT 1 DOSE (2MG ) SUBCUTANEOUSLY  ONCE A WEEK 9 mL 0   potassium chloride SA (KLOR-CON M) 20 MEQ tablet Take 1 tablet (20 mEq total) by mouth every other day. 15 tablet 6   rivaroxaban (XARELTO) 20 MG TABS tablet Take 1 tablet (20 mg total) by mouth daily with supper. 90 tablet 3   rosuvastatin (CRESTOR) 40 MG tablet Take 1 tablet (40 mg total) by mouth daily. 90 tablet 3   sacubitril-valsartan (ENTRESTO) 97-103 MG Take 1 tablet by mouth 2 (two) times daily. 60 tablet 6   spironolactone (ALDACTONE) 25 MG tablet Take 1 tablet (25 mg total) by mouth daily. 90 tablet 3   torsemide (DEMADEX) 20 MG tablet Take 2 tablets (40 mg total) by mouth daily. Take an extra 20 mg tablet daily as needed for edema (Patient taking differently: Take 40 mg by mouth daily. Alternating days 60 mg and 40 mg) 75 tablet 6   umeclidinium-vilanterol (ANORO ELLIPTA) 62.5-25 MCG/ACT AEPB Inhale 1 puff into the lungs daily. 30 each 12   No current facility-administered medications for this visit.   Vitals:   02/19/23 0839  BP: 116/75  Pulse: 91  SpO2: 97%  Weight: 249 lb 9.6 oz (113.2 kg)   Wt Readings from Last 3 Encounters:  02/19/23 249 lb 9.6 oz (113.2 kg)  02/11/23 246 lb (111.6 kg)  01/17/23 247 lb 3.2 oz (112.1 kg)   Lab Results  Component Value Date   CREATININE 1.65 (H) 01/17/2023   CREATININE 1.62 (H) 12/21/2022   CREATININE 1.55 (H) 11/11/2022   PHYSICAL EXAM:  General:  Well appearing. No resp difficulty HEENT: normal Neck: supple. JVP flat. No lymphadenopathy or thryomegaly appreciated. Cor: PMI normal. Regular rate &  rhythm. No rubs, gallops or murmurs. Lungs: clear Abdomen: soft, nontender, nondistended. No hepatosplenomegaly. No bruits or masses.  Extremities: no cyanosis, clubbing, rash, edema Neuro: alert & oriented x3, cranial nerves grossly intact. Moves all 4 extremities w/o difficulty. Affect pleasant.   ECG: not done   ASSESSMENT & PLAN:  1.  Chronic HFrEF/Mixed Ischemic and nonischemic cardiomyopathy:   - Mixed ICM/NICM - NYHA class II - s/p [place] [employer] Sq-ICD  - weighing daily;  reminded to call for an overnight weight gain of > 2 pounds or a weekly weight gain of > 5 pounds - weight up 2 pounds from last visit here 1 month ago - Echo 08/14/22: EF 40-45% with mild LVH, Grade I DD, severe hypokinesis of left ventricular, entire inferior and inferolateral wall.  - Echo 02/06/22: EF of 30-35% along with mild/moderate AR.  - Echo 11/24/21: EF of 20-25% along with mild LVH and mild Gabriel.  - Echo 05/21/21: EF of 40-45% along with mild LVH and mild Gabriel. Echo 05/21/21: EF of 40-45% along with mild LVH and mild Gabriel.  -  Echo 06/08/20: EF of 35-40% along with mildly elevated PA pressure of 44.7 mmHg, moderate LAE and mild/moderate Gabriel. - continue Entresto to 97/103 bid - continue Farxiga 10mg  daily - continue spiro 25 daily - continue carvedilol to 6.25mg  BID; consider titration at future visits although BP has dropped in the past - continue torsemide 60mg  QOD/ 40mg  QOD with potassium QOD - continue to use Furoscix if weight >= 250; last used ~ 3 months ago - consider Cardiomems to help volume management as needed -> his insurance won't cover - saw EP Lalla Brothers) 01/24 - ICD placed on 05/20/22 - saw [facility] provider (Bensimhon) 01/24 - BNP 12/21/22 was 34.0   2.  CAD/Elevated Troponin and chronic CP:  - H/o CAD  - RHC/LHC done 10/21 and showed:  Severe single-vessel coronary artery disease with chronic total occlusion of the proximal RCA.  The distal vessel fills via left to right collaterals.  Mild to  moderate, nonobstructive CAD involving the LAD and LCx with up to 50% stenosis.  Mildly elevated left and right heart filling pressures.  Moderate pulmonary hypertension (mean PAP 40 mmHg) with significantly elevated pulmonary vascular resistance (PVR 7.4 WU).  Severely reduced Fick cardiac output/index. - Stable angina - continue rosuvastatin 40mg  daily - saw cardiology Mariah Milling) 05/24   3.  HTN:  - BP 116/75 - saw PCP Suzie Portela) 05/24 - BMP 01/17/23 showed sodium 140, potassium 4.2, creatinine 1.65 & GFR 49   4.  H/o PE/Chronic anticoagulation:  - continue xarelto 20mg  daily - no recent bleeding   5.  DM2- - saw nephrology (Korrapati) 08/23  - A1c 01/17/23 was 6.6%   6. OSA- - Sleep study 3/22: AHI 28  - sleep study 12/23; AHI 45.1 - wearing CPAP nightly but having difficulty with mask and has appt later today to address this - saw pulmonology (Dgayli) 06/24  7: COPD- - stopped smoking 2021 - had smoked for close to 40 years with starting at the age of 89 - at his peak, he was smoking 2-2.5 ppd - saw pulmonology (Dgayli) 03/24  8: Dizziness- - meclizine helping some but dizziness continues - has ENT appt next week - thyroid panel 01/17/23 was normal  Return in 3 months, sooner if needed.

## 2023-02-24 DIAGNOSIS — H903 Sensorineural hearing loss, bilateral: Secondary | ICD-10-CM | POA: Diagnosis not present

## 2023-02-24 DIAGNOSIS — R42 Dizziness and giddiness: Secondary | ICD-10-CM | POA: Diagnosis not present

## 2023-02-24 DIAGNOSIS — H6981 Other specified disorders of Eustachian tube, right ear: Secondary | ICD-10-CM | POA: Diagnosis not present

## 2023-02-25 DIAGNOSIS — G4733 Obstructive sleep apnea (adult) (pediatric): Secondary | ICD-10-CM | POA: Diagnosis not present

## 2023-03-02 ENCOUNTER — Other Ambulatory Visit: Payer: Self-pay

## 2023-03-02 ENCOUNTER — Encounter: Payer: Self-pay | Admitting: Emergency Medicine

## 2023-03-02 ENCOUNTER — Emergency Department
Admission: EM | Admit: 2023-03-02 | Discharge: 2023-03-02 | Disposition: A | Discharge status: Home / Self Care | Payer: Medicaid Other | Attending: Emergency Medicine | Admitting: Emergency Medicine

## 2023-03-02 ENCOUNTER — Emergency Department: Payer: Medicaid Other

## 2023-03-02 DIAGNOSIS — N189 Chronic kidney disease, unspecified: Secondary | ICD-10-CM | POA: Insufficient documentation

## 2023-03-02 DIAGNOSIS — I129 Hypertensive chronic kidney disease with stage 1 through stage 4 chronic kidney disease, or unspecified chronic kidney disease: Secondary | ICD-10-CM | POA: Insufficient documentation

## 2023-03-02 DIAGNOSIS — M25512 Pain in left shoulder: Secondary | ICD-10-CM | POA: Diagnosis not present

## 2023-03-02 DIAGNOSIS — J449 Chronic obstructive pulmonary disease, unspecified: Secondary | ICD-10-CM | POA: Insufficient documentation

## 2023-03-02 DIAGNOSIS — G8929 Other chronic pain: Secondary | ICD-10-CM | POA: Insufficient documentation

## 2023-03-02 DIAGNOSIS — E1122 Type 2 diabetes mellitus with diabetic chronic kidney disease: Secondary | ICD-10-CM | POA: Insufficient documentation

## 2023-03-02 DIAGNOSIS — I2510 Atherosclerotic heart disease of native coronary artery without angina pectoris: Secondary | ICD-10-CM | POA: Diagnosis not present

## 2023-03-02 DIAGNOSIS — M5412 Radiculopathy, cervical region: Secondary | ICD-10-CM | POA: Insufficient documentation

## 2023-03-02 MED ORDER — ACETAMINOPHEN 500 MG PO TABS
1000.0000 mg | ORAL_TABLET | Freq: Once | ORAL | Status: AC
  Administered 2023-03-02: 1000 mg via ORAL
  Filled 2023-03-02: qty 2

## 2023-03-02 MED ORDER — KETOROLAC TROMETHAMINE 30 MG/ML IJ SOLN
30.0000 mg | Freq: Once | INTRAMUSCULAR | Status: AC
  Administered 2023-03-02: 30 mg via INTRAMUSCULAR
  Filled 2023-03-02: qty 1

## 2023-03-02 MED ORDER — CYCLOBENZAPRINE HCL 5 MG PO TABS: 5.0000 mg | ORAL_TABLET | ORAL | 0 refills | Status: DC | PRN

## 2023-03-02 NOTE — ED Triage Notes (Signed)
Pt reports pain to his left shoulder for a couple of months. Pt reports he thinks it may be from years of work. Pain starts at top of shoulder and radiates to his mid arm. Pt reports he is having difficulty raising that left arm. Pt reports he is supposed to have a MRI on it but he has not heard from his MD

## 2023-03-02 NOTE — ED Notes (Signed)
See triage note  Presents with pain to left shoulder  States pain has been intermittent for couple of months but became worse over the past few days  Denies any specific injury

## 2023-03-02 NOTE — ED Provider Notes (Signed)
[facility] Emergency Department Provider Note     Event Date/Time   First MD Initiated Contact with Patient 03/02/23 1349     (approximate)   History   Shoulder Pain   HPI  Gabriel [name]. is a 52 y.o. male with a past medical history of HTN, CAD, CKD, COPD, diabetes presents to the ED with complaint of left shoulder pain for 2 months and has since increased in severity this past week.  Endorses radiation descending to elbow.  Patient has tried ibuprofen and Tylenol with no relief.  Patient has experienced this before.  Denies trauma or injury.   Physical Exam   Triage Vital Signs: ED Triage Vitals  Encounter Vitals Group     BP 03/02/23 1340 (!) 176/96     Systolic BP Percentile --      Diastolic BP Percentile --      Pulse Rate 03/02/23 1340 92     Resp 03/02/23 1340 20     Temp 03/02/23 1340 98 F (36.7 C)     Temp Source 03/02/23 1340 Oral     SpO2 03/02/23 1340 93 %     Weight 03/02/23 1338 250 lb (113.4 kg)     Height 03/02/23 1338 5\' 11"  (1.803 m)     Head Circumference --      Peak Flow --      Pain Score 03/02/23 1338 10     Pain Loc --      Pain Education --      Exclude from Growth Chart --     Most recent vital signs: Vitals:   03/02/23 1340  BP: (!) 176/96  Pulse: 92  Resp: 20  Temp: 98 F (36.7 C)  SpO2: 93%    General Awake, no distress.  HEENT NCAT. PERRL. EOMI. AROM limited due to pain with right sided rotation.  CV:  Good peripheral perfusion.  RESP:  Normal effort.  ABD:  No distention.  Other:  No visible deformity of the left shoulder.  (+) Spurling's.  No ecchymosis noted.  Tenderness over anterior glenohumeral head.  TTP.  Neurovascular status intact throughout. 5/5 strength bilaterally.   ED Results / Procedures / Treatments   Labs (all labs ordered are listed, but only abnormal results are displayed) Labs Reviewed - No data to display  RADIOLOGY I personally viewed and evaluated these images  as part of my medical decision making, as well as reviewing the written report by the radiologist.  ED Provider Interpretation: Left shoulder x-ray is unremarkable.  DG Shoulder Left  Result Date: 03/02/2023 CLINICAL DATA:  Left shoulder pain. EXAM: LEFT SHOULDER - 3 VIEW COMPARISON:  None Available. FINDINGS: There is no evidence of fracture or dislocation. There is no evidence of arthropathy or other focal bone abnormality. Soft tissues are unremarkable. IMPRESSION: Negative left shoulder radiographs. Electronically Signed   By: Marin Roberts M.D.   On: 03/02/2023 15:48    PROCEDURES:  Critical Care performed: No  Procedures  MEDICATIONS ORDERED IN ED: Medications  ketorolac (TORADOL) 30 MG/ML injection 30 mg (30 mg Intramuscular Given 03/02/23 1507)  acetaminophen (TYLENOL) tablet 1,000 mg (1,000 mg Oral Given 03/02/23 1507)   IMPRESSION / MDM / ASSESSMENT AND PLAN / ED COURSE  I reviewed the triage vital signs and the nursing notes.  Clinical Course as of 03/02/23 1831  Wynelle Link Mar 02, 2023  [address] [facility] clinic defoor.  [MH]    Clinical Course User Index [MH] Conrad Bonaparte, PA-C   52 y.o. male presents to the emergency department for evaluation and treatment of chronic left shoulder pain. See HPI for further details.   Differential diagnosis includes, but is not limited to cervical radiculopathy, dislocation, adhesive capsulitis.  Given physical exam findings of positive Spurling's test his presentation is clinically consistent with cervical radiculopathy.  There is no evidence of fracture or dislocation as confirmed by imaging.  Patient is given Toradol and Tylenol in the ED with some improvement of his pain.  He reports he was provided with a steroids which gave him no relief in the pain and only increased his glucose.  Patient is stable for discharge and outpatient follow-up.  Patient is to follow-up in 3 days with orthopedics for further  management.  Also provided with neurology referral.  Patient will be discharged home with descriptions for Flexeril.  He is encouraged to take ibuprofen for pain.  He is placed in a sling for comfort and instructed to use as needed. Patient is given ED precautions to return to the ED for any worsening or new symptoms. Patient verbalizes understanding. All questions and concerns were addressed during ED visit.    Patient's presentation is most consistent with acute complicated illness / injury requiring diagnostic workup.  FINAL CLINICAL IMPRESSION(S) / ED DIAGNOSES   Final diagnoses:  Cervical radiculopathy  Chronic left shoulder pain   Rx / DC Orders   ED Discharge Orders          Ordered    cyclobenzaprine (FLEXERIL) 5 MG tablet  As needed        03/02/23 1623             Note:  This document was prepared using Dragon voice recognition software and may include unintentional dictation errors.    Romeo Apple, Sephora Boyar A, PA-C 03/02/23 1831    Dionne Bucy, MD 03/02/23 1907

## 2023-03-06 NOTE — Progress Notes (Unsigned)
Referring Physician:  Dionne Bucy, MD [address] [place],  [place] [address]  Primary Physician:  Jacky Kindle, FNP  History of Present Illness: 03/13/2023 [name] has a history of HTN, CAD, CKD, COPD, CHF, CAD, OSA, hyperlipidemia, CKD, psoriatic arthritis, gout, and DM.   Seen in ED on 03/02/23 for left shoulder pain that radiated to his elbow that was felt to be cervical mediated. He sees rheumatology- was given steroids that did not help and was to return for shoulder injection if needed.   He has constant left shoulder pain that radiates to his elbow x 1 year that has gotten progressively worse. He had left shoulder injectio on 03/11/23 by ortho. He had good pain relief and was able to move arm that day, but pain returned yesterday. No numbness or tingling. No right arm pain. Pain is worse with moving his left arm.   He starts PT on 04/18/23. Given flexeril from ED.   He is on XARELTO.   Bowel/Bladder Dysfunction: none  Conservative measures:  Physical therapy: Starts 04/18/23 for shoulder Multimodal medical therapy including regular antiinflammatories: steroids, flexeril  Injections: no epidural steroid injections   Past Surgery: no spinal surgery  Gabriel [name]. has no symptoms of cervical myelopathy. Is seeing ENT for dizziness.   The symptoms are causing a significant impact on the patient's life.   Review of Systems:  A 10 point review of systems is negative, except for the pertinent positives and negatives detailed in the HPI.  Past Medical History: Past Medical History:  Diagnosis Date   Acute on chronic combined systolic and diastolic CHF (congestive heart failure) (HCC) 09/02/2017   CAD (coronary artery disease)    a. 04/2015 low risk MV;  b. 12/2016 Cath: minor irregs in LAD/Diag/LCX/OM, RCA 40p/m/d; c. 05/2020 Cath: LM nl, LAD 50d, LCX 30p, OM1 40, RCA 100p w/ L->R collats. CO/CI 3.1/1.3-->Med rx.   Chest wall pain, chronic     Chronic Troponin Elevation    CKD (chronic kidney disease), stage II-III    COPD (chronic obstructive pulmonary disease) (HCC)    Diabetes mellitus without complication (HCC)    HFrEF (heart failure with reduced ejection fraction) (HCC)    a. 03/2015 Echo: EF 45-50%; b. 12/2015 Echo: EF 20-25%; c. 02/2016 Echo: EF 30-35%; d. 11/2016 Echo: EF 40-45%; e. 06/2019 Echo: EF 30-35%; f. 11/2019 Echo: EF 25-30%; g. 05/2020 Echo: EF 35-40%; h. 05/2021 Echo: EF 40-45%; i. 11/2021 Echo: EF 20-25%, glob HK, mild LVH, GrIII DD, sev red RV fxn, mild MR.   Hypertension    Mitral regurgitation    Mild to moderate by October 2021 echocardiogram.   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 03/2015 Echo: EF 45-50%; b. 12/2015 Echo: EF 20-25%; c. 02/2016 Echo: EF 30-35%; d. 11/2016 Echo: EF 40-45%; e. 06/2019 Echo: EF 30-35%; f. 11/2019 Echo: EF 25-30%; g. 05/2020 Echo: EF 35-40%; h. 05/2021 Echo: EF 40-45%; i. 11/2021 Echo: EF 20-25%   Myocardial infarct (HCC)    NSVT (nonsustained ventricular tachycardia) (HCC)    a. 12/2015 noted on tele-->amio;  b. 12/2015 Event monitor: no VT noted.   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  12/31/19: RUL and RLL PEs.   Syncope    a. 01/2016 - felt to be vasovagal.    Past Surgical History: Past Surgical History:  Procedure Laterality Date  AMPUTATION     CARDIAC CATHETERIZATION     COLONOSCOPY WITH PROPOFOL N/A 08/01/2022   Procedure: COLONOSCOPY WITH PROPOFOL;  Surgeon: Toney Reil, MD;  Location: [facility] ENDOSCOPY;  Service: Gastroenterology;  Laterality: N/A;   ESOPHAGOGASTRODUODENOSCOPY (EGD) WITH PROPOFOL N/A 08/01/2022   Procedure: ESOPHAGOGASTRODUODENOSCOPY (EGD) WITH PROPOFOL;  Surgeon: Toney Reil, MD;  Location: [facility] ENDOSCOPY;  Service: Gastroenterology;  Laterality: N/A;   ESOPHAGOGASTRODUODENOSCOPY (EGD) WITH PROPOFOL N/A 10/16/2022   Procedure:  ESOPHAGOGASTRODUODENOSCOPY (EGD) WITH PROPOFOL;  Surgeon: Toney Reil, MD;  Location: [facility] ENDOSCOPY;  Service: Gastroenterology;  Laterality: N/A;   FINGER AMPUTATION     Traumatic   FINGER FRACTURE SURGERY Left    FRACTURE SURGERY     LEFT HEART CATH AND CORONARY ANGIOGRAPHY N/A 01/06/2017   Procedure: Left Heart Cath and Coronary Angiography;  Surgeon: Iran Ouch, MD;  Location: [facility] INVASIVE CV LAB;  Service: Cardiovascular;  Laterality: N/A;   RIGHT/LEFT HEART CATH AND CORONARY ANGIOGRAPHY N/A 06/13/2020   Procedure: RIGHT/LEFT HEART CATH AND CORONARY ANGIOGRAPHY;  Surgeon: Yvonne Kendall, MD;  Location: [facility] INVASIVE CV LAB;  Service: Cardiovascular;  Laterality: N/A;   SUBQ ICD IMPLANT N/A 05/20/2022   Procedure: SUBQ ICD IMPLANT;  Surgeon: Lanier Prude, MD;  Location: [facility] INVASIVE CV LAB;  Service: Cardiovascular;  Laterality: N/A;    Allergies: Allergies as of 03/13/2023 - Review Complete 03/13/2023  Allergen Reaction Noted   Metformin and related Nausea And Vomiting 11/22/2019   Zantac [ranitidine hcl] Diarrhea and Nausea Only 08/27/2018    Medications: Outpatient Encounter Medications as of 03/13/2023  Medication Sig   Accu-Chek Softclix Lancets lancets USE UP TO FOUR TIMES DAILY   albuterol (VENTOLIN HFA) 108 (90 Base) MCG/ACT inhaler Inhale 2 puffs into the lungs every 4 (four) hours as needed for wheezing or shortness of breath.   allopurinol (ZYLOPRIM) 300 MG tablet Take 300 mg by mouth daily.   azelastine (ASTELIN) 0.1 % nasal spray Place 2 sprays into both nostrils 2 (two) times daily. Use in each nostril as directed   BD PEN NEEDLE NANO 2ND GEN 32G X 4 MM MISC USE  AS DIRECTED AT BEDTIME   blood glucose meter kit and supplies KIT Dispense based on patient and insurance preference. Use up to four times daily as directed.   buPROPion (WELLBUTRIN XL) 150 MG 24 hr tablet Take 1 tablet (150 mg total) by mouth daily.   carvedilol (COREG) 6.25 MG tablet  Take 2 tablets (12.5 mg total) by mouth 2 (two) times daily. Increased from 6.25 mg 2 times daily.   cetirizine (ZYRTEC) 10 MG tablet Take 1 tablet (10 mg total) by mouth daily.   colchicine 0.6 MG tablet Day 1: Take 2 tablets PO for gout flare, repeat 1 tablet one hour following initial dose. Day 2: Take 1 tablet twice daily PO for an additional 2-3 days.   Continuous Blood Gluc Sensor (FREESTYLE LIBRE 3 SENSOR) MISC Place 1 sensor on the skin every 14 days. Use to check glucose continuously   cyclobenzaprine (FLEXERIL) 5 MG tablet Take 1 tablet (5 mg total) by mouth as needed.   dapagliflozin propanediol (FARXIGA) 10 MG TABS tablet Take 1 tablet (10 mg total) by mouth daily.   fenofibrate (TRICOR) 145 MG tablet Take 1 tablet (145 mg total) by mouth daily.   FLUoxetine (PROZAC) 20 MG capsule Take 1 capsule (20 mg total) by mouth daily.   fluticasone (FLONASE) 50 MCG/ACT nasal spray Place 2 sprays into both nostrils daily  as needed. Home med.   icosapent Ethyl (VASCEPA) 1 g capsule Take 2 capsules (2 g total) by mouth 2 (two) times daily.   insulin aspart (NOVOLOG) 100 UNIT/ML FlexPen Inject 10 Units into the skin 3 (three) times daily with meals. Inject 10 units + correction before breakfast/lunch and supper (correction = 1 unit per 50 mg/dL above 542); max daily dose 36 units   Insulin Glargine (BASAGLAR KWIKPEN) 100 UNIT/ML Inject 18 Units into the skin at bedtime. Reduced from 38 units.   Insulin Pen Needle 32G X 4 MM MISC 1 each by Does not apply route in the morning, at noon, in the evening, and at bedtime.   meclizine (ANTIVERT) 25 MG tablet Take 1 tablet (25 mg total) by mouth 3 (three) times daily as needed for dizziness.   naltrexone (DEPADE) 50 MG tablet Take 1 tablet (50 mg total) by mouth daily.   nitroGLYCERIN (NITROSTAT) 0.4 MG SL tablet Place 1 tablet (0.4 mg total) under the tongue every 5 (five) minutes x 3 doses as needed for chest pain.   omeprazole (PRILOSEC) 40 MG capsule Take 1  capsule (40 mg total) by mouth daily.   OZEMPIC, 2 MG/DOSE, 8 MG/3ML SOPN INJECT 1 DOSE (2MG ) SUBCUTANEOUSLY  ONCE A WEEK   potassium chloride SA (KLOR-CON M) 20 MEQ tablet Take 1 tablet (20 mEq total) by mouth every other day.   rivaroxaban (XARELTO) 20 MG TABS tablet Take 1 tablet (20 mg total) by mouth daily with supper.   rosuvastatin (CRESTOR) 40 MG tablet Take 1 tablet (40 mg total) by mouth daily.   sacubitril-valsartan (ENTRESTO) 97-103 MG Hold until followup with cardiology due to acute kidney injury.   spironolactone (ALDACTONE) 25 MG tablet Hold until followup with cardiology due to acute kidney injury.   TALTZ 80 MG/ML SOAJ Inject 3 mLs into the skin every 14 (fourteen) days.   torsemide (DEMADEX) 20 MG tablet Take 2 tablets (40 mg total) by mouth daily. Alternating days 60 mg and 40 mg.  Home med.   umeclidinium-vilanterol (ANORO ELLIPTA) 62.5-25 MCG/ACT AEPB Inhale 1 puff into the lungs daily.   [DISCONTINUED] carvedilol (COREG) 6.25 MG tablet Take 1 tablet (6.25 mg total) by mouth 2 (two) times daily.   [DISCONTINUED] fluticasone (FLONASE) 50 MCG/ACT nasal spray Place 2 sprays into both nostrils daily. (Patient taking differently: Place 2 sprays into both nostrils daily as needed.)   [DISCONTINUED] Furosemide (FUROSCIX) 80 MG/10ML CTKT Inject 1 Dose into the skin as needed (edema).   [DISCONTINUED] insulin aspart (NOVOLOG) 100 UNIT/ML FlexPen Inject 10 Units into the skin 3 (three) times daily with meals. Inject 10 units + correction before breakfast/lunch and supper (correction = 1 unit per 50 mg/dL above 706); max daily dose 36 units   [DISCONTINUED] Insulin Glargine (BASAGLAR KWIKPEN) 100 UNIT/ML Inject 36 Units into the skin at bedtime.   [DISCONTINUED] insulin lispro (HUMALOG) 100 UNIT/ML KwikPen Inject 3 units before breakfast and lunch. 5 units before dinner.   [DISCONTINUED] metoCLOPramide (REGLAN) 5 MG tablet Take 1 tablet (5 mg total) by mouth 4 (four) times daily -  before  meals and at bedtime for 14 days. (Patient not taking: Reported on 02/05/2023)   [DISCONTINUED] sacubitril-valsartan (ENTRESTO) 97-103 MG Take 1 tablet by mouth 2 (two) times daily.   [DISCONTINUED] spironolactone (ALDACTONE) 25 MG tablet Take 1 tablet (25 mg total) by mouth daily.   [DISCONTINUED] torsemide (DEMADEX) 20 MG tablet Take 2 tablets (40 mg total) by mouth daily. Take an extra 20  mg tablet daily as needed for edema (Patient taking differently: Take 40 mg by mouth daily. Alternating days 60 mg and 40 mg)   No facility-administered encounter medications on file as of 03/13/2023.    Social History: Social History   Tobacco Use   Smoking status: Former    Current packs/day: 0.00    Average packs/day: 0.5 packs/day for 33.0 years (16.5 ttl pk-yrs)    Types: Cigarettes    Start date: 05/09/1987    Quit date: 05/08/2020    Years since quitting: 2.8   Smokeless tobacco: Former    Quit date: 05/08/2020   Tobacco comments:    Quit Sept 2021  Vaping Use   Vaping status: Former  Substance Use Topics   Alcohol use: Not Currently    Comment: occassionally   Drug use: No    Family Medical History: Family History  Problem Relation Age of Onset   Diabetes Mother    Diabetes Mellitus II Mother    Hypothyroidism Mother    Hypertension Mother    Kidney failure Mother        Dialysis   Heart attack Mother        32 yo approximately   Hypertension Father    Gout Father    Cancer Maternal Grandfather    Diabetes Maternal Grandfather    Cancer Paternal Aunt     Physical Examination: Vitals:   03/13/23 1344 03/13/23 1439  BP: (!) 152/94 (!) 142/90    General: Patient is well developed, well nourished, calm, collected, and in no apparent distress. Attention to examination is appropriate.  Respiratory: Patient is breathing without any difficulty.   NEUROLOGICAL:     Awake, alert, oriented to person, place, and time.  Speech is clear and fluent. Fund of knowledge is  appropriate.   Cranial Nerves: Pupils equal round and reactive to light.  Facial tone is symmetric.    Mild lower posterior cervical tenderness. Mild tenderness in bilateral trapezial region.   Painful limited ROM of left shoulder. He cannot get to 90 degrees of flexion or abduction. Pain with IR/ER of left shoulder.   No abnormal lesions on exposed skin.   Strength: Side Biceps Triceps Deltoid Interossei Grip Wrist Ext. Wrist Flex.  R 5 5 5 5 5 5 5   L 5 5 5 5 5 5 5    Side Iliopsoas Quads Hamstring PF DF EHL  R 5 5 5 5 5 5   L 5 5 5 5 5 5    Reflexes are 2+ and symmetric at the biceps, brachioradialis, patella and achilles.   Hoffman's is absent.  Clonus is not present.   Bilateral upper and lower extremity sensation is intact to light touch.     Gait is normal.     Medical Decision Making  Imaging: No cervical imaging.   Assessment and Plan: Gabriel [name] is a pleasant 52 y.o. male has constant left shoulder pain that radiates to his elbow x 1 year that has gotten progressively worse. He has intermittent neck pain.   No cervical imaging. Neck pain is likely cervical mediated. Left shoulder pain appears left shoulder mediated. He has painful/limited ROM of shoulder. He had diagnostic response to left shoulder injection by ortho.   Treatment options discussed with patient and following plan made:   - Cervical xrays ordered. Will message him with results.  - Continue to follow up with ortho as scheduled.  - Seeing ENT and he thought they wanted him to follow up with [place]  for dizziness. I think this was neurology. He will check when he gets home.  - Will regroup regarding follow up when I message him about his xrays.   BP was elevated. No symptoms of chest pain, shortness of breath, blurry vision, or headaches. He checks BP at home and it generally runs 130s/80s. Will recheck at home and call PCP if not improved. If he develops CP, SOB, blurry vision, or headaches, then he will go to  ED.     I spent a total of 35 minutes in face-to-face and non-face-to-face activities related to this patient's care today including review of outside records, review of imaging, review of symptoms, physical exam, discussion of differential diagnosis, discussion of treatment options, and documentation.   Thank you for involving me in the care of this patient.   Drake Leach PA-C Dept. of Neurosurgery

## 2023-03-07 ENCOUNTER — Inpatient Hospital Stay: Payer: Medicaid Other

## 2023-03-07 ENCOUNTER — Other Ambulatory Visit: Payer: Self-pay

## 2023-03-07 ENCOUNTER — Emergency Department: Payer: Medicaid Other

## 2023-03-07 ENCOUNTER — Inpatient Hospital Stay
Admission: EM | Admit: 2023-03-07 | Discharge: 2023-03-09 | DRG: 683 | Disposition: A | Discharge status: Home / Self Care | Payer: Medicaid Other | Attending: Hospitalist | Admitting: Hospitalist

## 2023-03-07 DIAGNOSIS — I2510 Atherosclerotic heart disease of native coronary artery without angina pectoris: Secondary | ICD-10-CM | POA: Diagnosis not present

## 2023-03-07 DIAGNOSIS — R42 Dizziness and giddiness: Secondary | ICD-10-CM | POA: Diagnosis not present

## 2023-03-07 DIAGNOSIS — G8929 Other chronic pain: Secondary | ICD-10-CM | POA: Diagnosis present

## 2023-03-07 DIAGNOSIS — Z95810 Presence of automatic (implantable) cardiac defibrillator: Secondary | ICD-10-CM | POA: Diagnosis not present

## 2023-03-07 DIAGNOSIS — M25512 Pain in left shoulder: Secondary | ICD-10-CM | POA: Diagnosis present

## 2023-03-07 DIAGNOSIS — I272 Pulmonary hypertension, unspecified: Secondary | ICD-10-CM | POA: Diagnosis not present

## 2023-03-07 DIAGNOSIS — E860 Dehydration: Secondary | ICD-10-CM | POA: Diagnosis not present

## 2023-03-07 DIAGNOSIS — T502X5A Adverse effect of carbonic-anhydrase inhibitors, benzothiadiazides and other diuretics, initial encounter: Secondary | ICD-10-CM | POA: Diagnosis present

## 2023-03-07 DIAGNOSIS — Z833 Family history of diabetes mellitus: Secondary | ICD-10-CM

## 2023-03-07 DIAGNOSIS — Z7901 Long term (current) use of anticoagulants: Secondary | ICD-10-CM

## 2023-03-07 DIAGNOSIS — Z79899 Other long term (current) drug therapy: Secondary | ICD-10-CM

## 2023-03-07 DIAGNOSIS — J449 Chronic obstructive pulmonary disease, unspecified: Secondary | ICD-10-CM | POA: Diagnosis not present

## 2023-03-07 DIAGNOSIS — Z87891 Personal history of nicotine dependence: Secondary | ICD-10-CM | POA: Diagnosis not present

## 2023-03-07 DIAGNOSIS — Z89029 Acquired absence of unspecified finger(s): Secondary | ICD-10-CM

## 2023-03-07 DIAGNOSIS — I2583 Coronary atherosclerosis due to lipid rich plaque: Secondary | ICD-10-CM | POA: Diagnosis not present

## 2023-03-07 DIAGNOSIS — R918 Other nonspecific abnormal finding of lung field: Secondary | ICD-10-CM | POA: Diagnosis not present

## 2023-03-07 DIAGNOSIS — N179 Acute kidney failure, unspecified: Principal | ICD-10-CM | POA: Diagnosis present

## 2023-03-07 DIAGNOSIS — Z720 Tobacco use: Secondary | ICD-10-CM | POA: Diagnosis present

## 2023-03-07 DIAGNOSIS — Z841 Family history of disorders of kidney and ureter: Secondary | ICD-10-CM

## 2023-03-07 DIAGNOSIS — G4733 Obstructive sleep apnea (adult) (pediatric): Secondary | ICD-10-CM | POA: Diagnosis present

## 2023-03-07 DIAGNOSIS — F1021 Alcohol dependence, in remission: Secondary | ICD-10-CM | POA: Diagnosis present

## 2023-03-07 DIAGNOSIS — E861 Hypovolemia: Secondary | ICD-10-CM | POA: Diagnosis not present

## 2023-03-07 DIAGNOSIS — E1122 Type 2 diabetes mellitus with diabetic chronic kidney disease: Secondary | ICD-10-CM | POA: Diagnosis present

## 2023-03-07 DIAGNOSIS — I255 Ischemic cardiomyopathy: Secondary | ICD-10-CM | POA: Diagnosis present

## 2023-03-07 DIAGNOSIS — I428 Other cardiomyopathies: Secondary | ICD-10-CM | POA: Diagnosis not present

## 2023-03-07 DIAGNOSIS — E1169 Type 2 diabetes mellitus with other specified complication: Secondary | ICD-10-CM

## 2023-03-07 DIAGNOSIS — I5042 Chronic combined systolic (congestive) and diastolic (congestive) heart failure: Secondary | ICD-10-CM | POA: Diagnosis not present

## 2023-03-07 DIAGNOSIS — I130 Hypertensive heart and chronic kidney disease with heart failure and stage 1 through stage 4 chronic kidney disease, or unspecified chronic kidney disease: Secondary | ICD-10-CM | POA: Diagnosis not present

## 2023-03-07 DIAGNOSIS — Z794 Long term (current) use of insulin: Secondary | ICD-10-CM

## 2023-03-07 DIAGNOSIS — I340 Nonrheumatic mitral (valve) insufficiency: Secondary | ICD-10-CM | POA: Diagnosis present

## 2023-03-07 DIAGNOSIS — I5043 Acute on chronic combined systolic (congestive) and diastolic (congestive) heart failure: Secondary | ICD-10-CM | POA: Diagnosis not present

## 2023-03-07 DIAGNOSIS — I5022 Chronic systolic (congestive) heart failure: Secondary | ICD-10-CM

## 2023-03-07 DIAGNOSIS — R0989 Other specified symptoms and signs involving the circulatory and respiratory systems: Secondary | ICD-10-CM | POA: Diagnosis not present

## 2023-03-07 DIAGNOSIS — R079 Chest pain, unspecified: Secondary | ICD-10-CM | POA: Diagnosis present

## 2023-03-07 DIAGNOSIS — Z1152 Encounter for screening for COVID-19: Secondary | ICD-10-CM

## 2023-03-07 DIAGNOSIS — N182 Chronic kidney disease, stage 2 (mild): Secondary | ICD-10-CM | POA: Diagnosis present

## 2023-03-07 DIAGNOSIS — Z7985 Long-term (current) use of injectable non-insulin antidiabetic drugs: Secondary | ICD-10-CM

## 2023-03-07 DIAGNOSIS — I472 Ventricular tachycardia, unspecified: Secondary | ICD-10-CM | POA: Diagnosis not present

## 2023-03-07 DIAGNOSIS — I420 Dilated cardiomyopathy: Secondary | ICD-10-CM | POA: Diagnosis not present

## 2023-03-07 DIAGNOSIS — Z8249 Family history of ischemic heart disease and other diseases of the circulatory system: Secondary | ICD-10-CM | POA: Diagnosis not present

## 2023-03-07 DIAGNOSIS — R072 Precordial pain: Secondary | ICD-10-CM | POA: Diagnosis not present

## 2023-03-07 DIAGNOSIS — Z86711 Personal history of pulmonary embolism: Secondary | ICD-10-CM | POA: Diagnosis present

## 2023-03-07 DIAGNOSIS — L409 Psoriasis, unspecified: Secondary | ICD-10-CM | POA: Diagnosis present

## 2023-03-07 DIAGNOSIS — E1165 Type 2 diabetes mellitus with hyperglycemia: Secondary | ICD-10-CM | POA: Diagnosis not present

## 2023-03-07 DIAGNOSIS — Z23 Encounter for immunization: Secondary | ICD-10-CM

## 2023-03-07 DIAGNOSIS — I951 Orthostatic hypotension: Secondary | ICD-10-CM | POA: Diagnosis not present

## 2023-03-07 DIAGNOSIS — I252 Old myocardial infarction: Secondary | ICD-10-CM

## 2023-03-07 DIAGNOSIS — N189 Chronic kidney disease, unspecified: Secondary | ICD-10-CM | POA: Diagnosis present

## 2023-03-07 DIAGNOSIS — Z888 Allergy status to other drugs, medicaments and biological substances status: Secondary | ICD-10-CM

## 2023-03-07 DIAGNOSIS — I25118 Atherosclerotic heart disease of native coronary artery with other forms of angina pectoris: Secondary | ICD-10-CM | POA: Diagnosis present

## 2023-03-07 DIAGNOSIS — Z9989 Dependence on other enabling machines and devices: Secondary | ICD-10-CM

## 2023-03-07 DIAGNOSIS — R0602 Shortness of breath: Secondary | ICD-10-CM | POA: Diagnosis not present

## 2023-03-07 DIAGNOSIS — R7989 Other specified abnormal findings of blood chemistry: Secondary | ICD-10-CM | POA: Diagnosis not present

## 2023-03-07 DIAGNOSIS — R0789 Other chest pain: Secondary | ICD-10-CM | POA: Diagnosis not present

## 2023-03-07 LAB — HEPATIC FUNCTION PANEL
ALT: 29 U/L (ref 0–44)
AST: 20 U/L (ref 15–41)
Albumin: 4 g/dL (ref 3.5–5.0)
Alkaline Phosphatase: 58 U/L (ref 38–126)
Bilirubin, Direct: 0.1 mg/dL (ref 0.0–0.2)
Indirect Bilirubin: 1.1 mg/dL — ABNORMAL HIGH (ref 0.3–0.9)
Total Bilirubin: 1.2 mg/dL (ref 0.3–1.2)
Total Protein: 6.9 g/dL (ref 6.5–8.1)

## 2023-03-07 LAB — CBG MONITORING, ED: Glucose-Capillary: 139 mg/dL — ABNORMAL HIGH (ref 70–99)

## 2023-03-07 LAB — BETA-HYDROXYBUTYRIC ACID: Beta-Hydroxybutyric Acid: 0.14 mmol/L (ref 0.05–0.27)

## 2023-03-07 LAB — BASIC METABOLIC PANEL
Anion gap: 16 — ABNORMAL HIGH (ref 5–15)
BUN: 34 mg/dL — ABNORMAL HIGH (ref 6–20)
CO2: 20 mmol/L — ABNORMAL LOW (ref 22–32)
Calcium: 9.4 mg/dL (ref 8.9–10.3)
Chloride: 99 mmol/L (ref 98–111)
Creatinine, Ser: 2.68 mg/dL — ABNORMAL HIGH (ref 0.61–1.24)
GFR, Estimated: 27 mL/min — ABNORMAL LOW (ref >60)
Glucose, Bld: 260 mg/dL — ABNORMAL HIGH (ref 70–99)
Potassium: 3.7 mmol/L (ref 3.5–5.1)
Sodium: 135 mmol/L (ref 135–145)

## 2023-03-07 LAB — CK: Total CK: 70 U/L (ref 49–397)

## 2023-03-07 LAB — BLOOD GAS, VENOUS
Acid-Base Excess: 2.3 mmol/L — ABNORMAL HIGH (ref 0.0–2.0)
Bicarbonate: 26.5 mmol/L (ref 20.0–28.0)
O2 Saturation: 86 %
Patient temperature: 37
pCO2, Ven: 39 mmHg — ABNORMAL LOW (ref 44–60)
pH, Ven: 7.44 — ABNORMAL HIGH (ref 7.25–7.43)
pO2, Ven: 57 mmHg — ABNORMAL HIGH (ref 32–45)

## 2023-03-07 LAB — CBC
HCT: 53.6 % — ABNORMAL HIGH (ref 39.0–52.0)
Hemoglobin: 17.9 g/dL — ABNORMAL HIGH (ref 13.0–17.0)
MCH: 27.5 pg (ref 26.0–34.0)
MCHC: 33.4 g/dL (ref 30.0–36.0)
MCV: 82.2 fL (ref 80.0–100.0)
Platelets: 236 10*3/uL (ref 150–400)
RBC: 6.52 MIL/uL — ABNORMAL HIGH (ref 4.22–5.81)
RDW: 15.3 % (ref 11.5–15.5)
WBC: 11.5 10*3/uL — ABNORMAL HIGH (ref 4.0–10.5)
nRBC: 0 % (ref 0.0–0.2)

## 2023-03-07 LAB — D-DIMER, QUANTITATIVE: D-Dimer, Quant: <0.27 ug/mL-FEU (ref 0.00–0.50)

## 2023-03-07 LAB — SARS CORONAVIRUS 2 BY RT PCR: SARS Coronavirus 2 by RT PCR: NEGATIVE

## 2023-03-07 LAB — ETHANOL: Alcohol, Ethyl (B): <10 mg/dL (ref <10)

## 2023-03-07 LAB — BRAIN NATRIURETIC PEPTIDE: B Natriuretic Peptide: 104.6 pg/mL — ABNORMAL HIGH (ref 0.0–100.0)

## 2023-03-07 LAB — LACTIC ACID, PLASMA: Lactic Acid, Venous: 1.6 mmol/L (ref 0.5–1.9)

## 2023-03-07 LAB — TROPONIN I (HIGH SENSITIVITY)
Troponin I (High Sensitivity): 57 ng/L — ABNORMAL HIGH (ref <18)
Troponin I (High Sensitivity): 42 ng/L — ABNORMAL HIGH (ref <18)

## 2023-03-07 MED ORDER — ACETAMINOPHEN 325 MG PO TABS
650.0000 mg | ORAL_TABLET | Freq: Four times a day (QID) | ORAL | Status: DC | PRN
  Administered 2023-03-08 (×2): 650 mg via ORAL
  Filled 2023-03-07 (×2): qty 2

## 2023-03-07 MED ORDER — ONDANSETRON HCL 4 MG/2ML IJ SOLN
4.0000 mg | Freq: Once | INTRAMUSCULAR | Status: AC
  Administered 2023-03-07: 4 mg via INTRAVENOUS

## 2023-03-07 MED ORDER — ACETAMINOPHEN 650 MG RE SUPP: 650.0000 mg | Freq: Four times a day (QID) | RECTAL | Status: DC | PRN

## 2023-03-07 MED ORDER — FLUOXETINE HCL 20 MG PO CAPS
20.0000 mg | ORAL_CAPSULE | Freq: Every day | ORAL | Status: DC
  Administered 2023-03-08 – 2023-03-09 (×2): 20 mg via ORAL
  Filled 2023-03-07 (×2): qty 1

## 2023-03-07 MED ORDER — SODIUM CHLORIDE 0.9% FLUSH
3.0000 mL | Freq: Two times a day (BID) | INTRAVENOUS | Status: DC
  Administered 2023-03-09: 3 mL via INTRAVENOUS

## 2023-03-07 MED ORDER — SODIUM CHLORIDE 0.9 % IV SOLN: Freq: Once | INTRAVENOUS | Status: AC

## 2023-03-07 MED ORDER — MORPHINE SULFATE (PF) 2 MG/ML IV SOLN
1.0000 mg | INTRAVENOUS | Status: DC | PRN
  Administered 2023-03-08 (×3): 1 mg via INTRAVENOUS
  Filled 2023-03-07 (×4): qty 1

## 2023-03-07 MED ORDER — ASPIRIN 81 MG PO CHEW
324.0000 mg | CHEWABLE_TABLET | Freq: Once | ORAL | Status: AC
  Administered 2023-03-07: 324 mg via ORAL

## 2023-03-07 MED ORDER — INSULIN ASPART 100 UNIT/ML IJ SOLN: 0.0000 [IU] | Freq: Three times a day (TID) | INTRAMUSCULAR | Status: DC

## 2023-03-07 MED ORDER — NICOTINE 21 MG/24HR TD PT24
21.0000 mg | MEDICATED_PATCH | Freq: Every day | TRANSDERMAL | Status: DC
  Filled 2023-03-07 (×2): qty 1

## 2023-03-07 MED ORDER — ROSUVASTATIN CALCIUM 10 MG PO TABS
40.0000 mg | ORAL_TABLET | Freq: Every day | ORAL | Status: DC
  Administered 2023-03-07 – 2023-03-09 (×3): 40 mg via ORAL
  Filled 2023-03-07: qty 4
  Filled 2023-03-07: qty 2
  Filled 2023-03-07: qty 4

## 2023-03-07 MED ORDER — SODIUM CHLORIDE 0.9 % IV BOLUS
1000.0000 mL | Freq: Once | INTRAVENOUS | Status: AC
  Administered 2023-03-07: 1000 mL via INTRAVENOUS

## 2023-03-07 MED ORDER — RIVAROXABAN 20 MG PO TABS
20.0000 mg | ORAL_TABLET | Freq: Every day | ORAL | Status: DC
  Administered 2023-03-08: 20 mg via ORAL
  Filled 2023-03-07: qty 1

## 2023-03-07 MED ORDER — NITROGLYCERIN 0.4 MG SL SUBL
0.4000 mg | SUBLINGUAL_TABLET | SUBLINGUAL | Status: DC | PRN
  Administered 2023-03-08: 0.4 mg via SUBLINGUAL
  Filled 2023-03-07 (×2): qty 1

## 2023-03-07 MED ORDER — HEPARIN SODIUM (PORCINE) 5000 UNIT/ML IJ SOLN: 5000.0000 [IU] | Freq: Three times a day (TID) | INTRAMUSCULAR | Status: DC

## 2023-03-07 MED ORDER — LACTATED RINGERS IV SOLN: INTRAVENOUS | Status: DC

## 2023-03-07 MED ORDER — LACTATED RINGERS IV SOLN: INTRAVENOUS | Status: AC

## 2023-03-07 MED ORDER — ALBUTEROL SULFATE (2.5 MG/3ML) 0.083% IN NEBU: 3.0000 mL | INHALATION_SOLUTION | RESPIRATORY_TRACT | Status: DC | PRN

## 2023-03-07 MED ORDER — INSULIN ASPART 100 UNIT/ML IJ SOLN
0.0000 [IU] | Freq: Three times a day (TID) | INTRAMUSCULAR | Status: DC
  Administered 2023-03-08: 3 [IU] via SUBCUTANEOUS
  Administered 2023-03-08: 2 [IU] via SUBCUTANEOUS
  Administered 2023-03-08: 3 [IU] via SUBCUTANEOUS
  Administered 2023-03-09: 2 [IU] via SUBCUTANEOUS
  Filled 2023-03-07 (×5): qty 1

## 2023-03-07 NOTE — Assessment & Plan Note (Signed)
Xarelto continued.

## 2023-03-07 NOTE — Assessment & Plan Note (Signed)
Vitals:   03/07/23 1710 03/07/23 1730 03/07/23 1800 03/07/23 1830  BP: 111/70 112/71 119/77 113/69   03/07/23 1900 03/07/23 1930 03/07/23 2000 03/07/23 2030  BP: 117/74 101/66 121/63 115/77   03/07/23 2100 03/07/23 2118 03/07/23 2130 03/07/23 2200  BP: 116/68 120/73 112/65 102/66  Secondary to hypotension from medications or dysrhythmia. Continue with gentle hydration and monitoring of blood pressure.

## 2023-03-07 NOTE — ED Provider Notes (Signed)
[facility] Provider Note    Event Date/Time   First MD Initiated Contact with Patient 03/07/23 1616     (approximate)   History   Chest Pain   HPI  Gabriel [name]. is a 52 y.o. male who presents to the emergency department today because of concerns for dizziness and chest discomfort.  The patient symptoms started roughly around 1:30 PM.  He is concerned that he might be dehydrated.  Only urinated 1 time today.  Was trying to drink water.  Denies any unusual outdoor activity.  His chest pain is located in the center part of his chest.  It is somewhat sharp and pressure-like.  Symptoms remind him of when he was seen and admitted a few years ago for dehydration.     Physical Exam   Triage Vital Signs: ED Triage Vitals [03/07/23 1556]  Encounter Vitals Group     BP (!) 84/57     Systolic BP Percentile      Diastolic BP Percentile      Pulse Rate (!) 108     Resp 20     Temp 98 F (36.7 C)     Temp Source Oral     SpO2 98 %     Weight 249 lb 1.9 oz (113 kg)     Height 5\' 11"  (1.803 m)     Head Circumference      Peak Flow      Pain Score 10     Pain Loc      Pain Education      Exclude from Growth Chart     Most recent vital signs: Vitals:   03/07/23 1556 03/07/23 1559  BP: (!) 84/57   Pulse: (!) 108 (!) 115  Resp: 20   Temp: 98 F (36.7 C)   SpO2: 98%    General: Awake, alert, oriented. CV:  Good peripheral perfusion. Tachycardia Resp:  Normal effort. Lungs clear. Abd:  No distention.    ED Results / Procedures / Treatments   Labs (all labs ordered are listed, but only abnormal results are displayed) Labs Reviewed  BASIC METABOLIC PANEL - Abnormal; Notable for the following components:      Result Value   CO2 20 (*)    Glucose, Bld 260 (*)    BUN 34 (*)    Creatinine, Ser 2.68 (*)    GFR, Estimated 27 (*)    Anion gap 16 (*)    All other components within normal limits  CBC - Abnormal; Notable for the following  components:   WBC 11.5 (*)    RBC 6.52 (*)    Hemoglobin 17.9 (*)    HCT 53.6 (*)    All other components within normal limits  BRAIN NATRIURETIC PEPTIDE - Abnormal; Notable for the following components:   B Natriuretic Peptide 104.6 (*)    All other components within normal limits  BLOOD GAS, VENOUS - Abnormal; Notable for the following components:   pH, Ven 7.44 (*)    pCO2, Ven 39 (*)    pO2, Ven 57 (*)    Acid-Base Excess 2.3 (*)    All other components within normal limits  HEPATIC FUNCTION PANEL - Abnormal; Notable for the following components:   Indirect Bilirubin 1.1 (*)    All other components within normal limits  CBG MONITORING, ED - Abnormal; Notable for the following components:   Glucose-Capillary 139 (*)    All other components within normal limits  TROPONIN I (HIGH SENSITIVITY) - Abnormal; Notable for the following components:   Troponin I (High Sensitivity) 57 (*)    All other components within normal limits  TROPONIN I (HIGH SENSITIVITY) - Abnormal; Notable for the following components:   Troponin I (High Sensitivity) 42 (*)    All other components within normal limits  SARS CORONAVIRUS 2 BY RT PCR  D-DIMER, QUANTITATIVE  BETA-HYDROXYBUTYRIC ACID  LACTIC ACID, PLASMA  CK  ETHANOL  COMPREHENSIVE METABOLIC PANEL  CBC  URINALYSIS, COMPLETE (UACMP) WITH MICROSCOPIC  HIV ANTIBODY (ROUTINE TESTING W REFLEX)  T4, FREE  TSH  TROPONIN I (HIGH SENSITIVITY)     EKG  I, Phineas Semen, attending physician, personally viewed and interpreted this EKG  EKG Time: 1555 Rate: 113 Rhythm: sinus tachycardia Axis: normal Intervals: qtc 477 QRS: narrow, q waves v1 ST changes: no st elevation Impression: abnormal ekg   RADIOLOGY I independently interpreted and visualized the CXR. My interpretation: No pneumonia Radiology interpretation:  IMPRESSION:  Low lung volumes with bronchovascular crowding. Left mid lung linear  opacity, likely atelectasis.         PROCEDURES:  Critical Care performed: No   MEDICATIONS ORDERED IN ED: Medications - No data to display   IMPRESSION / MDM / ASSESSMENT AND PLAN / ED COURSE  I reviewed the triage vital signs and the nursing notes.                              Differential diagnosis includes, but is not limited to, dehydration, ACS, electrolyte abnormality, anemia  Patient's presentation is most consistent with acute presentation with potential threat to life or bodily function.   The patient is on the cardiac monitor to evaluate for evidence of arrhythmia and/or significant heart rate changes.  Patient presented to the emergency department today because of concerns for dizziness and chest pain.  On exam patient was tachycardic and slightly hypotensive.  Blood work concerning for AKI.  I do think some component patient's symptoms is likely secondary to dehydration.  Will start IV fluids.  Additionally slight troponin elevation over baseline.  This point unclear if this truly represents ACS or simply slight elevation given worsening kidney function.  Will plan on repeat.  Repeat troponin was slightly improved.  At this time I have low concern for ACS and think most likely secondary to poor kidney function.  Given AKI I do think patient would benefit from admission.  Discussed with Dr. Allena Katz with the hospitalist service who will come and evaluate patient for admission.      FINAL CLINICAL IMPRESSION(S) / ED DIAGNOSES   Final diagnoses:  Dizziness  AKI (acute kidney injury) (HCC)  Nonspecific chest pain     Note:  This document was prepared using Dragon voice recognition software and may include unintentional dictation errors.    Phineas Semen, MD 03/07/23 [phone]

## 2023-03-07 NOTE — Assessment & Plan Note (Addendum)
Tni down trending , recheck pending. Will continue patient's rosuvastatin and start. As needed nitroglycerin.

## 2023-03-07 NOTE — H&P (Addendum)
History and Physical    Patient: Gabriel [name]. ZOX:[id number] DOB: [birth year] DOA: 03/07/2023 DOS: the patient was seen and examined on 03/07/2023 PCP: Jacky Kindle, FNP  Patient coming from: Home   Chief Complaint:  Chief Complaint  Patient presents with   Chest Pain    HPI: Gabriel [name]. is a 52 y.o. 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  Patient reports his symptoms around 1:00 where he was a little dizzy and chest pain was dehydrated.  No vomiting but patient has had nausea.  In the emergency room patient was initially hypotensive, diaphoretic with systolics of 84/57 heart rate of 108 afebrile and O2 sats of 98% on room air.'SpO2: 90 % EKG in the emergency room is sinus tach at 113 with narrow Q waves V1 and no ST changes. X-ray shows low lung volumes and left midlung linear opacity and suspected atelectasis. Venous blood gas shows pO2 of 57 pH of 7.4 and pCO2 of 39. Alcohol level less than 10. In the emergency room patient had a bolus of 1 L normal saline along with Zofran and a 324 aspirin.   Review of Systems: Review of Systems  Constitutional:  Positive for fever and malaise/fatigue.  Respiratory:  Positive for shortness of breath.   Cardiovascular:  Positive for chest pain and palpitations.  Neurological:  Positive for dizziness.   Past Medical History:  Diagnosis Date   Acute on chronic combined systolic and diastolic CHF (congestive heart failure) (HCC) 09/02/2017   CAD (coronary artery disease)    a. 04/2015 low risk MV;  b. 12/2016 Cath: minor irregs in LAD/Diag/LCX/OM, RCA 40p/m/d; c. 05/2020 Cath: LM nl, LAD 50d, LCX 30p, OM1 40, RCA 100p w/ L->R collats. CO/CI 3.1/1.3-->Med rx.   Chest wall pain, chronic    Chronic Troponin Elevation    CKD  (chronic kidney disease), stage II-III    COPD (chronic obstructive pulmonary disease) (HCC)    Diabetes mellitus without complication (HCC)    HFrEF (heart failure with reduced ejection fraction) (HCC)    a. 03/2015 Echo: EF 45-50%; b. 12/2015 Echo: EF 20-25%; c. 02/2016 Echo: EF 30-35%; d. 11/2016 Echo: EF 40-45%; e. 06/2019 Echo: EF 30-35%; f. 11/2019 Echo: EF 25-30%; g. 05/2020 Echo: EF 35-40%; h. 05/2021 Echo: EF 40-45%; i. 11/2021 Echo: EF 20-25%, glob HK, mild LVH, GrIII DD, sev red RV fxn, mild MR.   Hypertension    Mitral regurgitation    Mild to moderate by October 2021 echocardiogram.   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 03/2015 Echo: EF 45-50%; b. 12/2015 Echo: EF 20-25%; c. 02/2016 Echo: EF 30-35%; d. 11/2016 Echo: EF 40-45%; e. 06/2019 Echo: EF 30-35%; f. 11/2019 Echo: EF 25-30%; g. 05/2020 Echo: EF 35-40%; h. 05/2021 Echo: EF 40-45%; i. 11/2021 Echo: EF 20-25%   Myocardial infarct (HCC)    NSVT (nonsustained ventricular tachycardia) (HCC)    a. 12/2015 noted on tele-->amio;  b. 12/2015 Event monitor: no VT noted.   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  12/31/19: RUL and RLL PEs.   Syncope    a. 01/2016 - felt to be vasovagal.   Past Surgical History:  Procedure Laterality Date   AMPUTATION     CARDIAC  CATHETERIZATION     COLONOSCOPY WITH PROPOFOL N/A 08/01/2022   Procedure: COLONOSCOPY WITH PROPOFOL;  Surgeon: Toney Reil, MD;  Location: [facility] ENDOSCOPY;  Service: Gastroenterology;  Laterality: N/A;   ESOPHAGOGASTRODUODENOSCOPY (EGD) WITH PROPOFOL N/A 08/01/2022   Procedure: ESOPHAGOGASTRODUODENOSCOPY (EGD) WITH PROPOFOL;  Surgeon: Toney Reil, MD;  Location: [facility] ENDOSCOPY;  Service: Gastroenterology;  Laterality: N/A;   ESOPHAGOGASTRODUODENOSCOPY (EGD) WITH PROPOFOL N/A 10/16/2022   Procedure: ESOPHAGOGASTRODUODENOSCOPY (EGD) WITH PROPOFOL;  Surgeon: Toney Reil, MD;  Location: [facility] ENDOSCOPY;  Service: Gastroenterology;  Laterality: N/A;   FINGER AMPUTATION     Traumatic   FINGER FRACTURE SURGERY Left    FRACTURE SURGERY     LEFT HEART CATH AND CORONARY ANGIOGRAPHY N/A 01/06/2017   Procedure: Left Heart Cath and Coronary Angiography;  Surgeon: Iran Ouch, MD;  Location: [facility] INVASIVE CV LAB;  Service: Cardiovascular;  Laterality: N/A;   RIGHT/LEFT HEART CATH AND CORONARY ANGIOGRAPHY N/A 06/13/2020   Procedure: RIGHT/LEFT HEART CATH AND CORONARY ANGIOGRAPHY;  Surgeon: Yvonne Kendall, MD;  Location: [facility] INVASIVE CV LAB;  Service: Cardiovascular;  Laterality: N/A;   SUBQ ICD IMPLANT N/A 05/20/2022   Procedure: SUBQ ICD IMPLANT;  Surgeon: Lanier Prude, MD;  Location: [facility] INVASIVE CV LAB;  Service: Cardiovascular;  Laterality: N/A;   Social History:  reports that he quit smoking about 2 years ago. His smoking use included cigarettes. He started smoking about 35 years ago. He has a 16.5 pack-year smoking history. He quit smokeless tobacco use about 2 years ago. He reports that he does not currently use alcohol. He reports that he does not use drugs.  Allergies  Allergen Reactions   Metformin And Related Nausea And Vomiting   Zantac [Ranitidine Hcl] Diarrhea and Nausea Only    Night sweats    Family History  Problem Relation Age of Onset   Diabetes Mother    Diabetes Mellitus II Mother    Hypothyroidism Mother    Hypertension Mother    Kidney failure Mother        Dialysis   Heart attack Mother        16 yo approximately   Hypertension Father    Gout Father    Cancer Maternal Grandfather    Diabetes Maternal Grandfather    Cancer Paternal Aunt     Prior to Admission medications   Medication Sig Start Date End Date Taking? Authorizing Provider  albuterol (VENTOLIN HFA) 108 (90 Base) MCG/ACT inhaler Inhale 2 puffs into the lungs every 4 (four) hours as needed for wheezing or shortness of breath. 10/21/22   Raechel Chute,  MD  allopurinol (ZYLOPRIM) 300 MG tablet Take 300 mg by mouth daily.    [provider]  azelastine (ASTELIN) 0.1 % nasal spray Place 2 sprays into both nostrils 2 (two) times daily. Use in each nostril as directed 12/19/22   Jacky Kindle, FNP  BD PEN NEEDLE NANO 2ND GEN 32G X 4 MM MISC USE  AS DIRECTED AT BEDTIME 02/03/23   Jacky Kindle, FNP  blood glucose meter kit and supplies KIT Dispense based on patient and insurance preference. Use up to four times daily as directed. 07/08/22   Jacky Kindle, FNP  carvedilol (COREG) 6.25 MG tablet Take 1 tablet (6.25 mg total) by mouth 2 (two) times daily. 01/17/23   Delma Freeze, FNP  cetirizine (ZYRTEC) 10 MG tablet Take 1 tablet (10 mg total) by mouth daily. 12/19/22   Jacky Kindle, FNP  colchicine  0.6 MG tablet Day 1: Take 2 tablets PO for gout flare, repeat 1 tablet one hour following initial dose. Day 2: Take 1 tablet twice daily PO for an additional 2-3 days. 09/17/22   Jacky Kindle, FNP  Continuous Blood Gluc Sensor (FREESTYLE LIBRE 3 SENSOR) MISC Place 1 sensor on the skin every 14 days. Use to check glucose continuously 05/30/22   Erasmo Downer, MD  cyclobenzaprine (FLEXERIL) 5 MG tablet Take 1 tablet (5 mg total) by mouth as needed. 03/02/23   Romeo Apple, Myah A, PA-C  dapagliflozin propanediol (FARXIGA) 10 MG TABS tablet Take 1 tablet (10 mg total) by mouth daily. 11/08/22   Delma Freeze, FNP  fenofibrate (TRICOR) 145 MG tablet Take 1 tablet (145 mg total) by mouth daily. 01/15/22   Delma Freeze, FNP  FLUoxetine (PROZAC) 20 MG capsule Take 1 capsule (20 mg total) by mouth daily. 02/08/22   Jacky Kindle, FNP  fluticasone (FLONASE) 50 MCG/ACT nasal spray Place 2 sprays into both nostrils daily. Patient taking differently: Place 2 sprays into both nostrils daily as needed. 12/19/22   Jacky Kindle, FNP  Furosemide (FUROSCIX) 80 MG/10ML CTKT Inject 1 Dose into the skin as needed (edema). 07/22/22   Bensimhon, Bevelyn Buckles, MD   icosapent Ethyl (VASCEPA) 1 g capsule Take 2 capsules (2 g total) by mouth 2 (two) times daily. 09/16/22   Jacky Kindle, FNP  insulin aspart (NOVOLOG) 100 UNIT/ML FlexPen Inject 10 Units into the skin 3 (three) times daily with meals. Inject 10 units + correction before breakfast/lunch and supper (correction = 1 unit per 50 mg/dL above 355); max daily dose 36 units 02/06/23   Jacky Kindle, FNP  Insulin Glargine [facility] KWIKPEN) 100 UNIT/ML Inject 36 Units into the skin at bedtime. 01/19/22 02/09/23  Swayze, Ava, DO  Insulin Pen Needle 32G X 4 MM MISC 1 each by Does not apply route in the morning, at noon, in the evening, and at bedtime. 02/05/23   Jacky Kindle, FNP  meclizine (ANTIVERT) 25 MG tablet Take 1 tablet (25 mg total) by mouth 3 (three) times daily as needed for dizziness. 12/24/22   Antonieta Iba, MD  metoCLOPramide (REGLAN) 5 MG tablet Take 1 tablet (5 mg total) by mouth 4 (four) times daily -  before meals and at bedtime for 14 days. Patient not taking: Reported on 02/05/2023 11/06/22 12/24/22  Toney Reil, MD  nitroGLYCERIN (NITROSTAT) 0.4 MG SL tablet Place 1 tablet (0.4 mg total) under the tongue every 5 (five) minutes x 3 doses as needed for chest pain. 09/17/22   Jacky Kindle, FNP  omeprazole (PRILOSEC) 40 MG capsule Take 1 capsule (40 mg total) by mouth daily. 10/16/22   Toney Reil, MD  OZEMPIC, 2 MG/DOSE, 8 MG/3ML SOPN INJECT 1 DOSE (2MG ) SUBCUTANEOUSLY  ONCE A WEEK 01/27/23   Merita Norton T, FNP  potassium chloride SA (KLOR-CON M) 20 MEQ tablet Take 1 tablet (20 mEq total) by mouth every other day. 08/27/22   Bensimhon, Bevelyn Buckles, MD  rivaroxaban (XARELTO) 20 MG TABS tablet Take 1 tablet (20 mg total) by mouth daily with supper. 05/26/22   Graciella Freer, PA-C  rosuvastatin (CRESTOR) 40 MG tablet Take 1 tablet (40 mg total) by mouth daily. 04/01/22   Antonieta Iba, MD  sacubitril-valsartan (ENTRESTO) 97-103 MG Take 1 tablet by mouth 2 (two) times daily.  08/27/22   Bensimhon, Bevelyn Buckles, MD  spironolactone (ALDACTONE) 25 MG tablet  Take 1 tablet (25 mg total) by mouth daily. 04/25/22   Delma Freeze, FNP  torsemide (DEMADEX) 20 MG tablet Take 2 tablets (40 mg total) by mouth daily. Take an extra 20 mg tablet daily as needed for edema Patient taking differently: Take 40 mg by mouth daily. Alternating days 60 mg and 40 mg 08/27/22   Bensimhon, Bevelyn Buckles, MD  umeclidinium-vilanterol [facility] ELLIPTA) 62.5-25 MCG/ACT AEPB Inhale 1 puff into the lungs daily. 02/11/23   Raechel Chute, MD  insulin lispro (HUMALOG) 100 UNIT/ML KwikPen Inject 3 units before breakfast and lunch. 5 units before dinner. 01/19/22 01/21/22  Swayze, Ava, DO     Vitals:   03/07/23 2100 03/07/23 2118 03/07/23 2130 03/07/23 2200  BP: 116/68 120/73 112/65 102/66  Pulse: 89 83 88 84  Resp: (!) 24 (!) 21 (!) 25 (!) 22  Temp:  98 F (36.7 C)    TempSrc:      SpO2: 91% 92% 91% 90%  Weight:      Height:       Physical Exam Vitals and nursing note reviewed.  Constitutional:      General: He is not in acute distress. HENT:     Head: Normocephalic and atraumatic.     Right Ear: Hearing normal.     Left Ear: Hearing normal.     Nose: Nose normal. No nasal deformity.     Mouth/Throat:     Lips: Pink.     Tongue: No lesions.     Pharynx: Oropharynx is clear.  Eyes:     General: Lids are normal.     Extraocular Movements: Extraocular movements intact.  Cardiovascular:     Rate and Rhythm: Normal rate and regular rhythm.     Pulses: Normal pulses.     Heart sounds: Normal heart sounds.  Pulmonary:     Effort: Pulmonary effort is normal.     Breath sounds: No wheezing or rales.  Abdominal:     General: Bowel sounds are normal. There is no distension.     Palpations: Abdomen is soft. There is no mass.     Tenderness: There is no abdominal tenderness.  Musculoskeletal:     Right lower leg: No edema.     Left lower leg: No edema.  Skin:    General: Skin is warm.  Neurological:      General: No focal deficit present.     Mental Status: He is alert and oriented to person, place, and time.     Cranial Nerves: Cranial nerves 2-12 are intact.  Psychiatric:        Attention and Perception: Attention normal.        Mood and Affect: Mood normal.        Speech: Speech normal.        Behavior: Behavior normal. Behavior is cooperative.    Labs on Admission: I have personally reviewed following labs and imaging studies  CBC: Recent Labs  Lab 03/07/23 1558  WBC 11.5*  HGB 17.9*  HCT 53.6*  MCV 82.2  PLT 236   Basic Metabolic Panel: Recent Labs  Lab 03/07/23 1558  NA 135  K 3.7  CL 99  CO2 20*  GLUCOSE 260*  BUN 34*  CREATININE 2.68*  CALCIUM 9.4   GFR: Estimated Creatinine Clearance: 40.3 mL/min (A) (by C-G formula based on SCr of 2.68 mg/dL (H)). Liver Function Tests: Recent Labs  Lab 03/07/23 1758  AST 20  ALT 29  ALKPHOS 58  BILITOT 1.2  PROT 6.9  ALBUMIN 4.0   No results for input(s): "LIPASE", "AMYLASE" in the last 168 hours. No results for input(s): "AMMONIA" in the last 168 hours. Coagulation Profile: No results for input(s): "INR", "PROTIME" in the last 168 hours. Cardiac Enzymes: Recent Labs  Lab 03/07/23 1758  CKTOTAL 70   BNP (last 3 results) No results for input(s): "PROBNP" in the last 8760 hours. HbA1C: No results for input(s): "HGBA1C" in the last 72 hours. CBG: No results for input(s): "GLUCAP" in the last 168 hours. Lipid Profile: No results for input(s): "CHOL", "HDL", "LDLCALC", "TRIG", "CHOLHDL", "LDLDIRECT" in the last 72 hours. Thyroid Function Tests: No results for input(s): "TSH", "T4TOTAL", "FREET4", "T3FREE", "THYROIDAB" in the last 72 hours. Anemia Panel: No results for input(s): "VITAMINB12", "FOLATE", "FERRITIN", "TIBC", "IRON", "RETICCTPCT" in the last 72 hours. Urine analysis: Urinalysis    Component Value Date/Time   COLORURINE YELLOW (A) 01/17/2022 1953   APPEARANCEUR CLEAR (A) 01/17/2022 1953    APPEARANCEUR Clear 03/08/2020 0951   LABSPEC 1.012 01/17/2022 1953   LABSPEC 1.019 11/12/2012 1713   PHURINE 5.0 01/17/2022 1953   GLUCOSEU >=500 (A) 01/17/2022 1953   GLUCOSEU Negative 11/12/2012 1713   HGBUR NEGATIVE 01/17/2022 1953   BILIRUBINUR NEGATIVE 01/17/2022 1953   BILIRUBINUR Negative 03/08/2020 0951   BILIRUBINUR Negative 11/12/2012 1713   KETONESUR NEGATIVE 01/17/2022 1953   PROTEINUR NEGATIVE 01/17/2022 1953   NITRITE NEGATIVE 01/17/2022 1953   LEUKOCYTESUR NEGATIVE 01/17/2022 1953   LEUKOCYTESUR Negative 11/12/2012 1713    Unresulted Labs (From admission, onward)     Start     Ordered   03/08/23 0500  Comprehensive metabolic panel  Tomorrow morning,   R        03/07/23 2037   03/08/23 0500  CBC  Tomorrow morning,   R        03/07/23 2037   03/08/23 0500  HIV Antibody (routine testing w rflx)  (HIV Antibody (Routine testing w reflex) panel)  Once,   R        03/07/23 2211   03/08/23 0500  T4, free  Once,   R        03/07/23 2211   03/08/23 0500  TSH  Once,   R        03/07/23 2211   03/07/23 2104  Urinalysis, Complete w Microscopic -Urine, Random  Add-on,   AD       Question:  Specimen Source  Answer:  Urine, Random   03/07/23 2103   03/07/23 2018  Lactic acid, plasma  STAT Now then every 3 hours,   STAT      03/07/23 2020             Radiological Exams on Admission: DG Chest 2 View  Result Date: 03/07/2023 CLINICAL DATA:  Chest pain, shortness of breath, and dizziness EXAM: CHEST - 2 VIEW COMPARISON:  Chest radiograph dated 12/21/2022 FINDINGS: Lines/tubes: Partially imaged external cardiac defibrillator with lead projecting over the left anterior chest wall. Lungs: Low lung volumes with bronchovascular crowding. Left mid lung linear opacity. No focal consolidations. Pleura: No pneumothorax or pleural effusion. Heart/mediastinum: Similar mildly enlarged cardiomediastinal silhouette. Bones: No acute osseous abnormality. IMPRESSION: Low lung volumes with  bronchovascular crowding. Left mid lung linear opacity, likely atelectasis. Electronically Signed   By: Agustin Cree M.D.   On: 03/07/2023 16:48     Data Reviewed: Relevant notes from primary care and specialist visits, past discharge summaries as available in EHR, including Care Everywhere. Prior diagnostic  testing as pertinent to current admission diagnoses Updated medications and problem lists for reconciliation ED course, including vitals, labs, imaging, treatment and response to treatment Triage notes, nursing and pharmacy notes and ED provider's notes Notable results as noted in HPI Assessment and Plan: * Chest pain Tni down trending , recheck pending. Will continue patient's rosuvastatin and start. As needed nitroglycerin.    Coronary artery disease, non-occlusive Continue statin and anticoagulation. Coreg fenofibrate Entresto and Aldactone all held secondary to low blood pressures.  Chronic obstructive pulmonary disease (COPD) (HCC) As needed albuterol.   Acute kidney injury superimposed on chronic kidney disease (HCC) Lab Results  Component Value Date   CREATININE 2.68 (H) 03/07/2023   CREATININE 1.65 (H) 01/17/2023   CREATININE 1.62 (H) 12/21/2022  Suspect prerenal etiology  However we will get renal usg.  Currently hold Entresto and diuretic therapy. Hold blood pressure medication. Hold Aldactone and torsemide. Patient started on low rate IV fluid rehydration.    History of pulmonary embolism Xarelto continued.   ICD (implantable cardioverter-defibrillator) in place Integration as deemed appropriate per cardiology.   Chronic systolic CHF (congestive heart failure) (HCC) Hypovolemic, strict I's and O's. 2D echocardiogram per cardiology. Cardiology consulted due to chest pain and hypotension. Patient sees [facility] Dr. Mariah Milling.  Uncontrolled type 2 diabetes mellitus with hyperglycemia, with long-term current use of insulin (HCC) Glycemic  protocol. Currently will hold patient's Farxiga due to hypotension.  Tobacco use Nicotine patch.   Dizziness Vitals:   03/07/23 1710 03/07/23 1730 03/07/23 1800 03/07/23 1830  BP: 111/70 112/71 119/77 113/69   03/07/23 1900 03/07/23 1930 03/07/23 2000 03/07/23 2030  BP: 117/74 101/66 121/63 115/77   03/07/23 2100 03/07/23 2118 03/07/23 2130 03/07/23 2200  BP: 116/68 120/73 112/65 102/66  Secondary to hypotension from medications or dysrhythmia. Continue with gentle hydration and monitoring of blood pressure.     DVT prophylaxis:  Xarelto  Consults:  Cardiology [facility]  Advance Care Planning:    Code Status: Full Code   Family Communication:  None.  Disposition Plan:  Back to previous home environment  Severity of Illness: The appropriate patient status for this patient is INPATIENT. Inpatient status is judged to be reasonable and necessary in order to provide the required intensity of service to ensure the patient's safety. The patient's presenting symptoms, physical exam findings, and initial radiographic and laboratory data in the context of their chronic comorbidities is felt to place them at high risk for further clinical deterioration. Furthermore, it is not anticipated that the patient will be medically stable for discharge from the hospital within 2 midnights of admission.   * I certify that at the point of admission it is my clinical judgment that the patient will require inpatient hospital care spanning beyond 2 midnights from the point of admission due to high intensity of service, high risk for further deterioration and high frequency of surveillance required.*  Author: Gertha Calkin, MD 03/07/2023 10:21 PM  For on call review [email or web address].

## 2023-03-07 NOTE — Assessment & Plan Note (Addendum)
Lab Results  Component Value Date   CREATININE 2.68 (H) 03/07/2023   CREATININE 1.65 (H) 01/17/2023   CREATININE 1.62 (H) 12/21/2022  Suspect prerenal etiology  However we will get renal usg.  Currently hold Entresto and diuretic therapy. Hold blood pressure medication. Hold Aldactone and torsemide. Patient started on low rate IV fluid rehydration.

## 2023-03-07 NOTE — Assessment & Plan Note (Signed)
Glycemic protocol. Currently will hold patient's Farxiga due to hypotension.

## 2023-03-07 NOTE — Assessment & Plan Note (Signed)
Hypovolemic, strict I's and O's. 2D echocardiogram per cardiology. Cardiology consulted due to chest pain and hypotension. Patient sees [facility] Dr. Mariah Milling.

## 2023-03-07 NOTE — Assessment & Plan Note (Signed)
-   As needed albuterol 

## 2023-03-07 NOTE — Assessment & Plan Note (Signed)
Continue statin and anticoagulation. Coreg fenofibrate Entresto and Aldactone all held secondary to low blood pressures.

## 2023-03-07 NOTE — ED Triage Notes (Addendum)
Pt to ed from home via POV for CP and dizziness that started about 3-4 hours ago. Pt is caox4, Pt RR increased and is hypotensive and clammy in triage,

## 2023-03-07 NOTE — Assessment & Plan Note (Signed)
Integration as deemed appropriate per cardiology.

## 2023-03-07 NOTE — Assessment & Plan Note (Signed)
-  Nicotine patch 

## 2023-03-08 ENCOUNTER — Inpatient Hospital Stay (HOSPITAL_COMMUNITY)
Admit: 2023-03-08 | Discharge: 2023-03-08 | Disposition: A | Discharge status: Home / Self Care | Payer: Medicaid Other | Attending: Medical | Admitting: Medical

## 2023-03-08 DIAGNOSIS — Z9989 Dependence on other enabling machines and devices: Secondary | ICD-10-CM

## 2023-03-08 DIAGNOSIS — E861 Hypovolemia: Secondary | ICD-10-CM

## 2023-03-08 DIAGNOSIS — I5043 Acute on chronic combined systolic (congestive) and diastolic (congestive) heart failure: Secondary | ICD-10-CM

## 2023-03-08 DIAGNOSIS — R079 Chest pain, unspecified: Secondary | ICD-10-CM

## 2023-03-08 DIAGNOSIS — I420 Dilated cardiomyopathy: Secondary | ICD-10-CM

## 2023-03-08 DIAGNOSIS — I2510 Atherosclerotic heart disease of native coronary artery without angina pectoris: Secondary | ICD-10-CM | POA: Diagnosis not present

## 2023-03-08 DIAGNOSIS — I2583 Coronary atherosclerosis due to lipid rich plaque: Secondary | ICD-10-CM

## 2023-03-08 DIAGNOSIS — N179 Acute kidney failure, unspecified: Secondary | ICD-10-CM

## 2023-03-08 DIAGNOSIS — R7989 Other specified abnormal findings of blood chemistry: Secondary | ICD-10-CM

## 2023-03-08 DIAGNOSIS — G8929 Other chronic pain: Secondary | ICD-10-CM

## 2023-03-08 HISTORY — DX: Dependence on other enabling machines and devices: Z99.89

## 2023-03-08 LAB — ECHOCARDIOGRAM COMPLETE
Height: 72 in
S' Lateral: 4.45 cm
Single Plane A4C EF: 52.2 %
Weight: 4003.2 oz

## 2023-03-08 LAB — HIV ANTIBODY (ROUTINE TESTING W REFLEX): HIV Screen 4th Generation wRfx: NONREACTIVE

## 2023-03-08 LAB — CBC
HCT: 47.9 % (ref 39.0–52.0)
Hemoglobin: 16.3 g/dL (ref 13.0–17.0)
MCH: 28.1 pg (ref 26.0–34.0)
MCHC: 34 g/dL (ref 30.0–36.0)
MCV: 82.6 fL (ref 80.0–100.0)
Platelets: 165 10*3/uL (ref 150–400)
RBC: 5.8 MIL/uL (ref 4.22–5.81)
RDW: 14.7 % (ref 11.5–15.5)
WBC: 9.2 10*3/uL (ref 4.0–10.5)
nRBC: 0 % (ref 0.0–0.2)

## 2023-03-08 LAB — COMPREHENSIVE METABOLIC PANEL
ALT: 28 U/L (ref 0–44)
AST: 17 U/L (ref 15–41)
Albumin: 3.8 g/dL (ref 3.5–5.0)
Alkaline Phosphatase: 60 U/L (ref 38–126)
Anion gap: 11 (ref 5–15)
BUN: 35 mg/dL — ABNORMAL HIGH (ref 6–20)
CO2: 25 mmol/L (ref 22–32)
Calcium: 8.6 mg/dL — ABNORMAL LOW (ref 8.9–10.3)
Chloride: 103 mmol/L (ref 98–111)
Creatinine, Ser: 2.07 mg/dL — ABNORMAL HIGH (ref 0.61–1.24)
GFR, Estimated: 37 mL/min — ABNORMAL LOW (ref >60)
Glucose, Bld: 186 mg/dL — ABNORMAL HIGH (ref 70–99)
Potassium: 3.3 mmol/L — ABNORMAL LOW (ref 3.5–5.1)
Sodium: 139 mmol/L (ref 135–145)
Total Bilirubin: 1.4 mg/dL — ABNORMAL HIGH (ref 0.3–1.2)
Total Protein: 6.6 g/dL (ref 6.5–8.1)

## 2023-03-08 LAB — GLUCOSE, CAPILLARY
Glucose-Capillary: 197 mg/dL — ABNORMAL HIGH (ref 70–99)
Glucose-Capillary: 172 mg/dL — ABNORMAL HIGH (ref 70–99)
Glucose-Capillary: 123 mg/dL — ABNORMAL HIGH (ref 70–99)
Glucose-Capillary: 156 mg/dL — ABNORMAL HIGH (ref 70–99)
Glucose-Capillary: 172 mg/dL — ABNORMAL HIGH (ref 70–99)

## 2023-03-08 LAB — MAGNESIUM: Magnesium: 2 mg/dL (ref 1.7–2.4)

## 2023-03-08 LAB — TSH: TSH: 1.508 u[IU]/mL (ref 0.350–4.500)

## 2023-03-08 LAB — TROPONIN I (HIGH SENSITIVITY)
Troponin I (High Sensitivity): 44 ng/L — ABNORMAL HIGH (ref <18)
Troponin I (High Sensitivity): 43 ng/L — ABNORMAL HIGH (ref <18)

## 2023-03-08 LAB — T4, FREE: Free T4: 1.01 ng/dL (ref 0.61–1.12)

## 2023-03-08 MED ORDER — LACTATED RINGERS IV SOLN: INTRAVENOUS | Status: AC

## 2023-03-08 MED ORDER — TRAMADOL HCL 50 MG PO TABS
50.0000 mg | ORAL_TABLET | ORAL | Status: DC | PRN
  Administered 2023-03-08: 100 mg via ORAL

## 2023-03-08 MED ORDER — TRAMADOL HCL 50 MG PO TABS
50.0000 mg | ORAL_TABLET | Freq: Four times a day (QID) | ORAL | Status: DC | PRN
  Administered 2023-03-08: 100 mg via ORAL
  Filled 2023-03-08: qty 2

## 2023-03-08 MED ORDER — CARVEDILOL 6.25 MG PO TABS
6.2500 mg | ORAL_TABLET | Freq: Two times a day (BID) | ORAL | Status: DC
  Administered 2023-03-08 – 2023-03-09 (×2): 6.25 mg via ORAL
  Filled 2023-03-08 (×2): qty 1

## 2023-03-08 MED ORDER — PNEUMOCOCCAL VAC POLYVALENT 25 MCG/0.5ML IJ INJ
0.5000 mL | INJECTION | INTRAMUSCULAR | Status: AC
  Administered 2023-03-09: 0.5 mL via INTRAMUSCULAR
  Filled 2023-03-08 (×2): qty 0.5

## 2023-03-08 MED ORDER — TRAMADOL HCL 50 MG PO TABS
ORAL_TABLET | ORAL | Status: AC
  Filled 2023-03-08: qty 1

## 2023-03-08 MED ORDER — POTASSIUM CHLORIDE CRYS ER 20 MEQ PO TBCR
40.0000 meq | EXTENDED_RELEASE_TABLET | Freq: Once | ORAL | Status: AC
  Administered 2023-03-08: 40 meq via ORAL
  Filled 2023-03-08: qty 2

## 2023-03-08 MED ORDER — MORPHINE SULFATE (PF) 2 MG/ML IV SOLN
2.0000 mg | Freq: Once | INTRAVENOUS | Status: AC
  Administered 2023-03-08: 2 mg via INTRAVENOUS
  Filled 2023-03-08: qty 1

## 2023-03-08 MED ORDER — PERFLUTREN LIPID MICROSPHERE
1.0000 mL | INTRAVENOUS | Status: AC | PRN
  Administered 2023-03-08: 5 mL via INTRAVENOUS

## 2023-03-08 MED ORDER — ORAL CARE MOUTH RINSE: 15.0000 mL | OROMUCOSAL | Status: DC | PRN

## 2023-03-08 MED ORDER — TRAMADOL HCL 50 MG PO TABS
ORAL_TABLET | ORAL | Status: AC
  Filled 2023-03-08: qty 2

## 2023-03-08 NOTE — Assessment & Plan Note (Signed)
Pt has h/o osa and we will contineu cpap per home settings.

## 2023-03-08 NOTE — Progress Notes (Signed)
PROGRESS NOTE    Gabriel [name].  VWU:[id number] DOB: 1971/04/15 DOA: 03/07/2023 PCP: Jacky Kindle, FNP  [id number]  LOS: 1 day   Brief hospital course:   Assessment & Plan: Gabriel [name]. is a 52 y.o. 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    * Chest pain, chronic CAD --trop 40's, similarly mildly elevated as prior --cardio consulted.  Per cardio, chest pain "is chronic and unchanged. He says he always has chest pain and it never goes away. It has not changed in frequency or severity."   --Cardiac cath almost a year ago showed an occluded RCA with left-to-right collaterals and mild to moderate nonobstructive coronary disease of left circumflex and LAD.  --Echo showed no change in EF. --cont statin  Left shoulder pain --not a new problem, is undergoing outpatient workup --d/c IV morphine, no indication for IV opioids for non-acute MSK pain --tramadol 50-100 PRN  Chronic sCHF mixed ischemic/nonischemic cardiomyopathy  --last EF 40 to 45% in December 2023  --hold home Entresto, spironolactone and torsemide due to AKI --resume home coreg  AKI --likely related to volume depletion from overdiuresis.  Baseline Cr 1.5.  Cr 2.68 on presentation, improved to 2.07 with IVF.   --hold home Entresto, spironolactone and torsemide due to AKI --cont gentle MIVF  Dizziness and vertigo --not a new problem.  Already being set up for outpatient ENT  --orthostatic BP neg.  Chronic obstructive pulmonary disease (COPD) (HCC) As needed albuterol.  History of pulmonary embolism --cont Xarelto.   ICD (implantable cardioverter-defibrillator) in place Integration as deemed appropriate per cardiology.  Uncontrolled type 2 diabetes mellitus with hyperglycemia, with long-term current use  of insulin (HCC) Glycemic protocol. Currently will hold patient's Farxiga due to hypotension.  Tobacco use Nicotine patch.  CPAP (continuous positive airway pressure) dependence Pt has h/o osa and we will contineu cpap per home settings.    DVT prophylaxis: [id number]  Code Status: Full code  Family Communication:  Level of care: Progressive Dispo:   The patient is from: home Anticipated d/c is to: home Anticipated d/c date is: tomorrow   Subjective and Interval History:  Pt reported still having chest pain, also dizziness, but mostly complained of left shoulder pain, and asked for IV morphine.  When told IV morphine was d/c'ed and tramadol ordered, pt said he didn't want tramadol and he might just as well be in pain then.   Objective: Vitals:   03/08/23 1207 03/08/23 1209 03/08/23 1210 03/08/23 1705  BP: 132/80 135/77 139/84 (!) 142/77  Pulse: 73 82 90 79  Resp: 16  (!) 22 20  Temp: 97.8 F (36.6 C)   98.2 F (36.8 C)  TempSrc:    Oral  SpO2: 98% 100% 96% 94%  Weight:      Height:        Intake/Output Summary (Last 24 hours) at 03/08/2023 1850 Last data filed at 03/08/2023 1023 Gross per 24 hour  Intake 404.12 ml  Output 900 ml  Net -495.88 ml   Filed Weights   03/07/23 1556 03/08/23 0021  Weight: 113 kg 113.5 kg    Examination:   Constitutional: NAD, AAOx3 HEENT: conjunctivae and lids normal, EOMI CV: No cyanosis.   RESP: normal respiratory effort, on RA Neuro: II - XII grossly intact.     Data Reviewed: I have personally  reviewed labs and imaging studies  Time spent: 50 minutes  Darlin Priestly, MD [facility] If 7PM-7AM, please contact night-coverage 03/08/2023, 6:50 PM

## 2023-03-08 NOTE — Plan of Care (Signed)

## 2023-03-08 NOTE — Consult Note (Signed)
Cardiology Consultation   Patient ID: Gabriel [name]. MRN: [id number]; DOB: [birth year]  Admit date: 03/07/2023 Date of Consult: 03/08/2023  PCP:  Jacky Kindle, FNP   [facility] Providers Cardiologist:  Julien Nordmann, MD  Electrophysiologist:  Lanier Prude, MD  {  Patient Profile:   Gabriel [name]. is a 52 y.o. 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 Fran Lowes.  History of Present Illness:   Gabriel [name] is followed by Dr. Mariah Milling.   He was admitted in 05/2020 with chest pain and elevated troponin. Cath showed CTO of the RCA with L>R collaterals. He otherwise had moderate, nonobstructive disease, including a 50% distal stenosis in the LAD, 30% stenosis in the let Circumflex, and a 40% OM1 lesion. He was medically managed. Follow-up echo in October 2022 in the setting of recurrent hospitalization for HF showed EF 40-45%.   Readmitted 09/2021 and 11/2021 with heart failure. He responded will to IV lasix and at his request, he was discharged home on April 12 on torsemide 40mg  BID.   ER visit 01/17/22 with elevated BG levels. Troponin 50. K 27. Scr up from baseline. He was treated with insulin, IVF and KCL.  Echo 02/06/22 LVEF 30-35%. The patient underwent ICD placement 05/20/22. Echo 07/2022 showed LVEF 40-45%.   Seen by heart failure clinic most recently 08/2022 and volume status was good on torsemide 60mg  BID and Furoscix if weight >250lbs.   The patient presented to [facility] ED 03/07/23 with dizziness. IT started at 1:30 the day on presentation. Felt very dizzy, like the room was spinning. He didn't fall. He reports unchanged chronic chest pain. He has a litite nausea. Says he came into the ER and was given SL NTG and this made his  chest pain worse. He also reported SOB. No LLE, fever, chills, orthopnea or pnd. He has not need Furoscix.   In the ER he was hypotensive with BP 84/57, HR 108bpm, O2 98% on RA. EKG showed Sinus tachycardia. HS trop 57>42>44>43. BNP 104. Scr 2.68, BUN 34, CO2 20. Ddimer negative. CXR showed low lung volume and left midlung linear opacity and suspected atelectasis. Alcohol <10. He was given 1 bolus of NS and admitted.   Past Medical History:  Diagnosis Date   Acute on chronic combined systolic and diastolic CHF (congestive heart failure) (HCC) 09/02/2017   CAD (coronary artery disease)    a. 04/2015 low risk MV;  b. 12/2016 Cath: minor irregs in LAD/Diag/LCX/OM, RCA 40p/m/d; c. 05/2020 Cath: LM nl, LAD 50d, LCX 30p, OM1 40, RCA 100p w/ L->R collats. CO/CI 3.1/1.3-->Med rx.   Chest wall pain, chronic    Chronic Troponin Elevation    CKD (chronic kidney disease), stage II-III    COPD (chronic obstructive pulmonary disease) (HCC)    Diabetes mellitus without complication (HCC)    HFrEF (heart failure with reduced ejection fraction) (HCC)    a. 03/2015 Echo: EF 45-50%; b. 12/2015 Echo: EF 20-25%; c. 02/2016 Echo: EF 30-35%; d. 11/2016 Echo: EF 40-45%; e. 06/2019 Echo: EF 30-35%; f. 11/2019 Echo: EF 25-30%; g. 05/2020 Echo: EF 35-40%; h. 05/2021 Echo: EF 40-45%; i. 11/2021 Echo: EF 20-25%, glob HK, mild LVH, GrIII DD, sev red RV fxn, mild MR.   Hypertension  Mitral regurgitation    Mild to moderate by October 2021 echocardiogram.   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 03/2015 Echo: EF 45-50%; b. 12/2015 Echo: EF 20-25%; c. 02/2016 Echo: EF 30-35%; d. 11/2016 Echo: EF 40-45%; e. 06/2019 Echo: EF 30-35%; f. 11/2019 Echo: EF 25-30%; g. 05/2020 Echo: EF 35-40%; h. 05/2021 Echo: EF 40-45%; i. 11/2021 Echo: EF 20-25%   Myocardial infarct (HCC)    NSVT (nonsustained ventricular tachycardia) (HCC)    a. 12/2015 noted on tele-->amio;  b. 12/2015 Event monitor: no VT noted.   Obesity (BMI  30.0-34.9)    Psoriasis    Recurrent pulmonary emboli (HCC) 06/07/2020   06/07/20: small bilateral PEs.  12/31/19: RUL and RLL PEs.   Syncope    a. 01/2016 - felt to be vasovagal.    Past Surgical History:  Procedure Laterality Date   AMPUTATION     CARDIAC CATHETERIZATION     COLONOSCOPY WITH PROPOFOL N/A 08/01/2022   Procedure: COLONOSCOPY WITH PROPOFOL;  Surgeon: Toney Reil, MD;  Location: [facility] ENDOSCOPY;  Service: Gastroenterology;  Laterality: N/A;   ESOPHAGOGASTRODUODENOSCOPY (EGD) WITH PROPOFOL N/A 08/01/2022   Procedure: ESOPHAGOGASTRODUODENOSCOPY (EGD) WITH PROPOFOL;  Surgeon: Toney Reil, MD;  Location: [facility] ENDOSCOPY;  Service: Gastroenterology;  Laterality: N/A;   ESOPHAGOGASTRODUODENOSCOPY (EGD) WITH PROPOFOL N/A 10/16/2022   Procedure: ESOPHAGOGASTRODUODENOSCOPY (EGD) WITH PROPOFOL;  Surgeon: Toney Reil, MD;  Location: [facility] ENDOSCOPY;  Service: Gastroenterology;  Laterality: N/A;   FINGER AMPUTATION     Traumatic   FINGER FRACTURE SURGERY Left    FRACTURE SURGERY     LEFT HEART CATH AND CORONARY ANGIOGRAPHY N/A 01/06/2017   Procedure: Left Heart Cath and Coronary Angiography;  Surgeon: Iran Ouch, MD;  Location: [facility] INVASIVE CV LAB;  Service: Cardiovascular;  Laterality: N/A;   RIGHT/LEFT HEART CATH AND CORONARY ANGIOGRAPHY N/A 06/13/2020   Procedure: RIGHT/LEFT HEART CATH AND CORONARY ANGIOGRAPHY;  Surgeon: Yvonne Kendall, MD;  Location: [facility] INVASIVE CV LAB;  Service: Cardiovascular;  Laterality: N/A;   SUBQ ICD IMPLANT N/A 05/20/2022   Procedure: SUBQ ICD IMPLANT;  Surgeon: Lanier Prude, MD;  Location: [facility] INVASIVE CV LAB;  Service: Cardiovascular;  Laterality: N/A;     Home Medications:  Prior to Admission medications   Medication Sig Start Date End Date Taking? Authorizing Provider  albuterol (VENTOLIN HFA) 108 (90 Base) MCG/ACT inhaler Inhale 2 puffs into the lungs every 4 (four) hours as needed for wheezing or shortness of  breath. 10/21/22   Raechel Chute, MD  allopurinol (ZYLOPRIM) 300 MG tablet Take 300 mg by mouth daily.    [provider]  azelastine (ASTELIN) 0.1 % nasal spray Place 2 sprays into both nostrils 2 (two) times daily. Use in each nostril as directed 12/19/22   Jacky Kindle, FNP  BD PEN NEEDLE NANO 2ND GEN 32G X 4 MM MISC USE  AS DIRECTED AT BEDTIME 02/03/23   Jacky Kindle, FNP  blood glucose meter kit and supplies KIT Dispense based on patient and insurance preference. Use up to four times daily as directed. 07/08/22   Jacky Kindle, FNP  carvedilol (COREG) 6.25 MG tablet Take 1 tablet (6.25 mg total) by mouth 2 (two) times daily. 01/17/23   Delma Freeze, FNP  cetirizine (ZYRTEC) 10 MG tablet Take 1 tablet (10 mg total) by mouth daily. 12/19/22   Jacky Kindle, FNP  colchicine 0.6 MG tablet Day 1: Take 2 tablets PO for gout flare, repeat  1 tablet one hour following initial dose. Day 2: Take 1 tablet twice daily PO for an additional 2-3 days. 09/17/22   Jacky Kindle, FNP  Continuous Blood Gluc Sensor (FREESTYLE LIBRE 3 SENSOR) MISC Place 1 sensor on the skin every 14 days. Use to check glucose continuously 05/30/22   Erasmo Downer, MD  cyclobenzaprine (FLEXERIL) 5 MG tablet Take 1 tablet (5 mg total) by mouth as needed. 03/02/23   Romeo Apple, Myah A, PA-C  dapagliflozin propanediol (FARXIGA) 10 MG TABS tablet Take 1 tablet (10 mg total) by mouth daily. 11/08/22   Delma Freeze, FNP  fenofibrate (TRICOR) 145 MG tablet Take 1 tablet (145 mg total) by mouth daily. 01/15/22   Delma Freeze, FNP  FLUoxetine (PROZAC) 20 MG capsule Take 1 capsule (20 mg total) by mouth daily. 02/08/22   Jacky Kindle, FNP  fluticasone (FLONASE) 50 MCG/ACT nasal spray Place 2 sprays into both nostrils daily. Patient taking differently: Place 2 sprays into both nostrils daily as needed. 12/19/22   Jacky Kindle, FNP  Furosemide (FUROSCIX) 80 MG/10ML CTKT Inject 1 Dose into the skin as needed (edema). 07/22/22    Bensimhon, Bevelyn Buckles, MD  icosapent Ethyl (VASCEPA) 1 g capsule Take 2 capsules (2 g total) by mouth 2 (two) times daily. 09/16/22   Jacky Kindle, FNP  insulin aspart (NOVOLOG) 100 UNIT/ML FlexPen Inject 10 Units into the skin 3 (three) times daily with meals. Inject 10 units + correction before breakfast/lunch and supper (correction = 1 unit per 50 mg/dL above 725); max daily dose 36 units 02/06/23   Jacky Kindle, FNP  Insulin Glargine [facility] KWIKPEN) 100 UNIT/ML Inject 36 Units into the skin at bedtime. 01/19/22 02/09/23  Swayze, Ava, DO  Insulin Pen Needle 32G X 4 MM MISC 1 each by Does not apply route in the morning, at noon, in the evening, and at bedtime. 02/05/23   Jacky Kindle, FNP  meclizine (ANTIVERT) 25 MG tablet Take 1 tablet (25 mg total) by mouth 3 (three) times daily as needed for dizziness. 12/24/22   Antonieta Iba, MD  metoCLOPramide (REGLAN) 5 MG tablet Take 1 tablet (5 mg total) by mouth 4 (four) times daily -  before meals and at bedtime for 14 days. Patient not taking: Reported on 02/05/2023 11/06/22 12/24/22  Toney Reil, MD  nitroGLYCERIN (NITROSTAT) 0.4 MG SL tablet Place 1 tablet (0.4 mg total) under the tongue every 5 (five) minutes x 3 doses as needed for chest pain. 09/17/22   Jacky Kindle, FNP  omeprazole (PRILOSEC) 40 MG capsule Take 1 capsule (40 mg total) by mouth daily. 10/16/22   Toney Reil, MD  OZEMPIC, 2 MG/DOSE, 8 MG/3ML SOPN INJECT 1 DOSE (2MG ) SUBCUTANEOUSLY  ONCE A WEEK 01/27/23   Merita Norton T, FNP  potassium chloride SA (KLOR-CON M) 20 MEQ tablet Take 1 tablet (20 mEq total) by mouth every other day. 08/27/22   Bensimhon, Bevelyn Buckles, MD  rivaroxaban (XARELTO) 20 MG TABS tablet Take 1 tablet (20 mg total) by mouth daily with supper. 05/26/22   Graciella Freer, PA-C  rosuvastatin (CRESTOR) 40 MG tablet Take 1 tablet (40 mg total) by mouth daily. 04/01/22   Antonieta Iba, MD  sacubitril-valsartan (ENTRESTO) 97-103 MG Take 1 tablet by mouth 2  (two) times daily. 08/27/22   Bensimhon, Bevelyn Buckles, MD  spironolactone (ALDACTONE) 25 MG tablet Take 1 tablet (25 mg total) by mouth daily. 04/25/22   Hackney,  Jarold Song, FNP  torsemide (DEMADEX) 20 MG tablet Take 2 tablets (40 mg total) by mouth daily. Take an extra 20 mg tablet daily as needed for edema Patient taking differently: Take 40 mg by mouth daily. Alternating days 60 mg and 40 mg 08/27/22   Bensimhon, Bevelyn Buckles, MD  umeclidinium-vilanterol [facility] ELLIPTA) 62.5-25 MCG/ACT AEPB Inhale 1 puff into the lungs daily. 02/11/23   Raechel Chute, MD  insulin lispro (HUMALOG) 100 UNIT/ML KwikPen Inject 3 units before breakfast and lunch. 5 units before dinner. 01/19/22 01/21/22  Swayze, Ava, DO    Inpatient Medications: Scheduled Meds:  FLUoxetine  20 mg Oral Daily   insulin aspart  0-15 Units Subcutaneous TID WC   nicotine  21 mg Transdermal Daily   [START ON 03/09/2023] pneumococcal 23 valent vaccine  0.5 mL Intramuscular Tomorrow-1000   rivaroxaban  20 mg Oral Q supper   rosuvastatin  40 mg Oral Daily   sodium chloride flush  3 mL Intravenous Q12H   Continuous Infusions:  lactated ringers 50 mL/hr at 03/08/23 0627   PRN Meds: acetaminophen **OR** acetaminophen, albuterol, morphine injection, nitroGLYCERIN, mouth rinse  Allergies:    Allergies  Allergen Reactions   Metformin And Related Nausea And Vomiting   Zantac [Ranitidine Hcl] Diarrhea and Nausea Only    Night sweats    Social History:   Social History   Socioeconomic History   Marital status: Widowed    Spouse name: Not on file   Number of children: 1   Years of education: 95   Highest education level: 12th grade  Occupational History   Occupation: disabled  Tobacco Use   Smoking status: Former    Current packs/day: 0.00    Average packs/day: 0.5 packs/day for 33.0 years (16.5 ttl pk-yrs)    Types: Cigarettes    Start date: 05/09/1987    Quit date: 05/08/2020    Years since quitting: 2.8   Smokeless tobacco: Former    Quit  date: 05/08/2020   Tobacco comments:    Quit Sept 2021  Vaping Use   Vaping status: Former  Substance and Sexual Activity   Alcohol use: Not Currently    Comment: occassionally   Drug use: No   Sexual activity: Not Currently  Other Topics Concern   Not on file  Social History Narrative   Lives at home with wife and his dad's wife.        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    Social Determinants of Health   Financial Resource Strain: Medium Risk (08/05/2022)   Overall Financial Resource Strain (CARDIA)    Difficulty of Paying Living Expenses: Somewhat hard  Food Insecurity: No Food Insecurity (03/08/2023)   Hunger Vital Sign    Worried About Running Out of Food in the Last Year: Never true    Ran Out of Food in the Last Year: Never true  Transportation Needs: No Transportation Needs (03/08/2023)   PRAPARE - Administrator, Civil Service (Medical): No    Lack of Transportation (Non-Medical): No  Physical Activity: Insufficiently Active (01/20/2018)   Exercise Vital Sign    Days of Exercise per Week: 7 days    Minutes of Exercise per Session: 10 min  Stress: Stress Concern Present (09/02/2017)   [name] of Occupational Health - Occupational Stress Questionnaire    Feeling of Stress : Very much  Social Connections: Moderately Isolated (09/02/2017)   Social Connection  and Isolation Panel [NHANES]    Frequency of Communication with Friends and Family: Twice a week    Frequency of Social Gatherings with Friends and Family: Once a week    Attends Religious Services: Never    Database administrator or Organizations: No    Attends Banker Meetings: Never    Marital Status: Widowed  Intimate Partner Violence: Not At Risk (03/08/2023)   Humiliation, Afraid, Rape, and Kick questionnaire    Fear of Current or Ex-Partner: No    Emotionally Abused: No    Physically Abused: No    Sexually  Abused: No    Family History:    Family History  Problem Relation Age of Onset   Diabetes Mother    Diabetes Mellitus II Mother    Hypothyroidism Mother    Hypertension Mother    Kidney failure Mother        Dialysis   Heart attack Mother        64 yo approximately   Hypertension Father    Gout Father    Cancer Maternal Grandfather    Diabetes Maternal Grandfather    Cancer Paternal Aunt      ROS:  Please see the history of present illness.   All other ROS reviewed and negative.     Physical Exam/Data:   Vitals:   03/08/23 0021 03/08/23 0216 03/08/23 0232 03/08/23 0301  BP: 134/79 (!) 101/45 98/62 116/72  Pulse: 89 87 90 83  Resp: 19   20  Temp: 97.6 F (36.4 C)   97.8 F (36.6 C)  TempSrc: Oral   Oral  SpO2: 96% 90% 90% 95%  Weight: 113.5 kg     Height: 6' (1.829 m)       Intake/Output Summary (Last 24 hours) at 03/08/2023 0711 Last data filed at 03/08/2023 6295 Gross per 24 hour  Intake 207.39 ml  Output 400 ml  Net -192.61 ml      03/08/2023   12:21 AM 03/07/2023    3:56 PM 03/02/2023    1:38 PM  Last 3 Weights  Weight (lbs) 250 lb 3.2 oz 249 lb 1.9 oz 250 lb  Weight (kg) 113.49 kg 113 kg 113.399 kg     Body mass index is 33.93 kg/m.  General:  Well nourished, well developed, in no acute distress HEENT: normal Neck: no JVD Vascular: No carotid bruits; Distal pulses 2+ bilaterally Cardiac:  normal S1, S2; RRR; no murmur  Lungs:  clear to auscultation bilaterally, no wheezing, rhonchi or rales  Abd: soft, nontender, no hepatomegaly  Ext: no edema Musculoskeletal:  No deformities, BUE and BLE strength normal and equal Skin: warm and dry  Neuro:  CNs 2-12 intact, no focal abnormalities noted Psych:  Normal affect   EKG:  The EKG was personally reviewed and demonstrates:  NSR, 84bpm, nonspecific T wave changes Telemetry:  Telemetry was personally reviewed and demonstrates:  NSR PVCs, 6 beats NSVT  Relevant CV Studies:  Echo 07/2022 1. Left  ventricular ejection fraction, by estimation, is 40 to 45%. The  left ventricle has mildly decreased function. The left ventricle  demonstrates regional wall motion abnormalities (see scoring  diagram/findings for description). The left ventricular   internal cavity size was mildly dilated. There is mild left ventricular  hypertrophy. Left ventricular diastolic parameters are consistent with  Grade I diastolic dysfunction (impaired relaxation). There is severe  hypokinesis of the left ventricular,  entire inferior wall and inferolateral wall.   2. Right ventricular  systolic function is normal. The right ventricular  size is normal. Tricuspid regurgitation signal is inadequate for assessing  PA pressure.   3. Left atrial size was mildly dilated.   4. The mitral valve is normal in structure. No evidence of mitral valve  regurgitation. No evidence of mitral stenosis.   5. The aortic valve is normal in structure. Aortic valve regurgitation is  mild. Aortic valve sclerosis/calcification is present, without any  evidence of aortic stenosis.   6. Aortic dilatation noted. There is mild dilatation of the aortic root,  measuring 40 mm.   Comparison(s): A prior study was performed on 11/24/2021. Changes from prior  study are noted. The left ventricular function has improved.   Limited echo 01/2022  1. Left ventricular ejection fraction, by estimation, is 30 to 35%. The  left ventricle has moderate to severely decreased function. Left  ventricular diastolic parameters are consistent with Grade II diastolic  dysfunction (pseudonormalization).   2. Aortic valve regurgitation is mild to moderate.   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  The distal vessel fills via left to right collaterals. Mild to moderate, nonobstructive CAD involving the LAD and LCx with up to 50% stenosis. Mildly elevated left and right  heart filling pressures. Moderate pulmonary hypertension (mean PAP 40 mmHg) with significantly elevated pulmonary vascular resistance (PVR 7.4 WU). Severely reduced Fick cardiac output/index.   Recommendations: Continue IV diuresis for at least 1 more day. Given severely reduced cardiac output, I will decrease carvedilol to 6.5 mg twice daily.  If blood pressure tolerates and renal function is stable, consider further escalation of Entresto. Restart IV heparin 2 hours after TR band removal.  If no evidence of bleeding or vascular complication, the patient can be transitioned back to [facility] tomorrow. 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  He was evaluated by Dr. Shirlee Latch in June but failed to follow-up since then. Aggressive secondary prevention of coronary artery disease.  Favor medical management of CTO of RCA, which is new since 2018.   Yvonne Kendall, MD [facility]  Echo 05/2020  1. Left ventricular ejection fraction, by estimation, is 35 to 40%. The  left ventricle has moderately decreased function. The left ventricle  demonstrates global hypokinesis. The left ventricular internal cavity size  was moderately dilated. Left  ventricular diastolic parameters are indeterminate.   2. Right ventricular systolic function is normal. The right ventricular  size is normal. There is mildly elevated pulmonary artery systolic  pressure. The estimated right ventricular systolic pressure is 44.7 mmHg.   3. Left atrial size was moderately dilated.   4. Mild to moderate mitral valve regurgitation.   Laboratory Data:  High Sensitivity Troponin:   Recent Labs  Lab 03/07/23 1558 03/07/23 1758 03/07/23 2222 03/08/23 0101  TROPONINIHS 57* 42* 44* 43*     Chemistry Recent Labs  Lab 03/07/23 1558 03/08/23 0353  NA 135 139  K 3.7 3.3*  CL 99 103  CO2  20* 25  GLUCOSE 260* 186*  BUN 34* 35*  CREATININE 2.68* 2.07*  CALCIUM 9.4 8.6*  MG  --  2.0  GFRNONAA 27* 37*  ANIONGAP 16* 11    Recent Labs  Lab 03/07/23 1758 03/08/23 0353  PROT 6.9 6.6  ALBUMIN 4.0 3.8  AST 20 17  ALT 29 28  ALKPHOS 58 60  BILITOT  1.2 1.4*   Lipids No results for input(s): "CHOL", "TRIG", "HDL", "LABVLDL", "LDLCALC", "CHOLHDL" in the last 168 hours.  Hematology Recent Labs  Lab 03/29/23 1558 03/08/23 0353  WBC 11.5* 9.2  RBC 6.52* 5.80  HGB 17.9* 16.3  HCT 53.6* 47.9  MCV 82.2 82.6  MCH 27.5 28.1  MCHC 33.4 34.0  RDW 15.3 14.7  PLT 236 165   Thyroid  Recent Labs  Lab 03/08/23 0353  TSH 1.508  FREET4 1.01    BNP Recent Labs  Lab March 29, 2023 1724  BNP 104.6*    DDimer  Recent Labs  Lab March 29, 2023 1724  DDIMER <0.27     Radiology/Studies:  US RENAL  Result Date: 2023-03-29 CLINICAL DATA:  Acute kidney injury EXAM: RENAL / URINARY TRACT ULTRASOUND COMPLETE COMPARISON:  None Available. FINDINGS: Right Kidney: Renal measurements: 11.1 x 5.7 x 6.1 cm = volume: 201 mL. Echogenicity within normal limits. No mass or hydronephrosis visualized. Left Kidney: Renal measurements: 11.8 x 5.6 x 4.9 cm = volume: 171 mL. Echogenicity within normal limits. No mass or hydronephrosis visualized. Bladder: Appears normal for degree of bladder distention. Other: None. IMPRESSION: No significant sonographic abnormality of the kidneys. Electronically Signed   By: Darliss Cheney M.D.   On: 29-Mar-2023 23:07   DG Chest 2 View  Result Date: 29-Mar-2023 CLINICAL DATA:  Chest pain, shortness of breath, and dizziness EXAM: CHEST - 2 VIEW COMPARISON:  Chest radiograph dated 12/21/2022 FINDINGS: Lines/tubes: Partially imaged external cardiac defibrillator with lead projecting over the left anterior chest wall. Lungs: Low lung volumes with bronchovascular crowding. Left mid lung linear opacity. No focal consolidations. Pleura: No pneumothorax or pleural effusion.  Heart/mediastinum: Similar mildly enlarged cardiomediastinal silhouette. Bones: No acute osseous abnormality. IMPRESSION: Low lung volumes with bronchovascular crowding. Left mid lung linear opacity, likely atelectasis. Electronically Signed   By: Agustin Cree M.D.   On: 03/29/23 16:48     Assessment and Plan:   Dizziness Hypotension AKI - presented with dizziness found to have hypotension, AKI with minimally elevated trop and near normal BNP. HE reported chronic chest pain and minimal SOB - s/p IVF - PTA entresto, torsemide, farxiga, spiro and Coreg held - kidney function is improving 2.68>2.07. Baseline around 1.5 - Blood pressures/kidney function are improving - add back meds once BP/kidney function improves. May not tolerate high dose Entresto  - check orthostatics - recommend he check BP at home  Chest pain CAD - patient has a history of chronic chest pain and elevated troponin, levels so far stable - HS trop trend not consistent with ACS - he reports his chronic chest pain - would not start IV heparin - LHC 2021 showed CTO pRCA with L>R collaterals, mild to mod nonobstructive CAD - echo ordered - continue medical management with fenofibrate, Vascepa and Crestor  HFrEF Mixed ICM/NICM S/p ICD - Echo 07/2022 showed LVEF 40-45%, mild LVH, G1DD, severe HK.EF as low as 30-35% in the past - PTA Farxiga 10mg  daily, Entresto 97-103mg  BID, spironolactone 25mg  daily, torsemide alternating 40/60mg  daily, Coreg 6.25mg  BID - meds held for hypotension and AKI - Patient is euvolemic on exam - AKI/BP improving, restart meds as able - check echo as above - supplement K  H/o pulmonary embolism - continue Xarelto  For questions or updates, please contact [facility] Please consult [email or web address] for contact info under    Signed, Chanda Laperle David Stall, PA-C  03/08/2023 7:11 AM

## 2023-03-08 NOTE — Progress Notes (Signed)
  Echocardiogram 2D Echocardiogram has been performed. Definity IV ultrasound imaging agent used on this study.  Gabriel [name] 03/08/2023, 1:16 PM

## 2023-03-08 NOTE — Plan of Care (Signed)
Alert, oriented. Orthostatic vitals obtained. Medicated for pain with PRN medication. Dr. Fran Lowes changed medication from IV dose to oral. Patient educated, reinforcement needed.   Problem: Education: Goal: Ability to describe self-care measures that may prevent or decrease complications (Diabetes Survival Skills Education) will improve Outcome: Progressing   Problem: Coping: Goal: Ability to adjust to condition or change in health will improve Outcome: Progressing   Problem: Fluid Volume: Goal: Ability to maintain a balanced intake and output will improve Outcome: Progressing   Problem: Health Behavior/Discharge Planning: Goal: Ability to identify and utilize available resources and services will improve Outcome: Progressing   Problem: Pain Managment: Goal: General experience of comfort will improve Outcome: Progressing   Problem: Safety: Goal: Ability to remain free from injury will improve Outcome: Progressing

## 2023-03-08 NOTE — Progress Notes (Signed)
Handoff completed with RN taking over care for remainder of shift.  Report given to Audry RN at this time.

## 2023-03-08 NOTE — Progress Notes (Signed)
       CROSS COVER NOTE  NAME: Gabriel [name]. MRN: [id number] DOB : Dec 06, 1970    Concern as stated by nurse / staff   Patient reported unrelieved chronic pain described as in shoulder that radiates to chest. No relief with 1 mg IV morphine and reports headache and worse pain after SL  Had 6 beat run v tach     Pertinent findings on chart review: Extensive cardiac history with chronic chest wall pain and combined systolic/diastolic heart failure   Assessment and  Interventions   Assessment: EKG reviewed by me SR 84 non specific T wave changes. Prolonged QT Plan: Morphine 2 mg IV x 1 Continue telemetry monitoring, Monitor and supplement K and mag goal K >4 and Mag above 2       Donnie Mesa NP [facility] Cross Cover 7pm-7am - check amion for availability Pager [phone]

## 2023-03-09 DIAGNOSIS — I951 Orthostatic hypotension: Secondary | ICD-10-CM | POA: Diagnosis not present

## 2023-03-09 DIAGNOSIS — I5022 Chronic systolic (congestive) heart failure: Secondary | ICD-10-CM

## 2023-03-09 DIAGNOSIS — I2510 Atherosclerotic heart disease of native coronary artery without angina pectoris: Secondary | ICD-10-CM | POA: Diagnosis not present

## 2023-03-09 DIAGNOSIS — R079 Chest pain, unspecified: Secondary | ICD-10-CM | POA: Diagnosis not present

## 2023-03-09 LAB — BASIC METABOLIC PANEL
Anion gap: 7 (ref 5–15)
BUN: 31 mg/dL — ABNORMAL HIGH (ref 6–20)
CO2: 25 mmol/L (ref 22–32)
Calcium: 9 mg/dL (ref 8.9–10.3)
Chloride: 104 mmol/L (ref 98–111)
Creatinine, Ser: 1.56 mg/dL — ABNORMAL HIGH (ref 0.61–1.24)
GFR, Estimated: 52 mL/min — ABNORMAL LOW (ref >60)
Glucose, Bld: 159 mg/dL — ABNORMAL HIGH (ref 70–99)
Potassium: 4.3 mmol/L (ref 3.5–5.1)
Sodium: 136 mmol/L (ref 135–145)

## 2023-03-09 LAB — MAGNESIUM: Magnesium: 2.1 mg/dL (ref 1.7–2.4)

## 2023-03-09 LAB — CBC
HCT: 45.9 % (ref 39.0–52.0)
Hemoglobin: 15.4 g/dL (ref 13.0–17.0)
MCH: 27.9 pg (ref 26.0–34.0)
MCHC: 33.6 g/dL (ref 30.0–36.0)
MCV: 83.3 fL (ref 80.0–100.0)
Platelets: 158 10*3/uL (ref 150–400)
RBC: 5.51 MIL/uL (ref 4.22–5.81)
RDW: 14.3 % (ref 11.5–15.5)
WBC: 7.7 10*3/uL (ref 4.0–10.5)
nRBC: 0 % (ref 0.0–0.2)

## 2023-03-09 LAB — GLUCOSE, CAPILLARY: Glucose-Capillary: 123 mg/dL — ABNORMAL HIGH (ref 70–99)

## 2023-03-09 MED ORDER — TORSEMIDE 20 MG PO TABS: 60.0000 mg | ORAL_TABLET | ORAL | Status: DC

## 2023-03-09 MED ORDER — SPIRONOLACTONE 25 MG PO TABS
ORAL_TABLET | ORAL | Status: DC
Start: 2023-03-09 — End: 2023-07-07

## 2023-03-09 MED ORDER — CARVEDILOL 6.25 MG PO TABS: 12.5000 mg | ORAL_TABLET | Freq: Two times a day (BID) | ORAL | 0 refills | Status: DC

## 2023-03-09 MED ORDER — DAPAGLIFLOZIN PROPANEDIOL 10 MG PO TABS
10.0000 mg | ORAL_TABLET | Freq: Every day | ORAL | Status: DC
  Administered 2023-03-09: 10 mg via ORAL
  Filled 2023-03-09: qty 1

## 2023-03-09 MED ORDER — SACUBITRIL-VALSARTAN 97-103 MG PO TABS: ORAL_TABLET | ORAL | Status: DC

## 2023-03-09 MED ORDER — TORSEMIDE 20 MG PO TABS
40.0000 mg | ORAL_TABLET | ORAL | Status: DC
  Administered 2023-03-09: 40 mg via ORAL
  Filled 2023-03-09: qty 2

## 2023-03-09 MED ORDER — TORSEMIDE 20 MG PO TABS: 40.0000 mg | ORAL_TABLET | Freq: Every day | ORAL | Status: DC

## 2023-03-09 MED ORDER — BASAGLAR KWIKPEN 100 UNIT/ML ~~LOC~~ SOPN: 18.0000 [IU] | PEN_INJECTOR | Freq: Every evening | SUBCUTANEOUS | Status: DC

## 2023-03-09 MED ORDER — FLUTICASONE PROPIONATE 50 MCG/ACT NA SUSP: 2.0000 | Freq: Every day | NASAL | Status: DC | PRN

## 2023-03-09 MED ORDER — NICOTINE 21 MG/24HR TD PT24: 21.0000 mg | MEDICATED_PATCH | Freq: Every day | TRANSDERMAL | 0 refills | Status: DC

## 2023-03-09 NOTE — Discharge Summary (Signed)
Physician Discharge Summary   Bransyn Adami.  male DOB: 12/24/1970  WUJ:[id number]  PCP: Jacky Kindle, FNP  Admit date: 03/07/2023 Discharge date: 03/09/2023  Admitted From: home Disposition:  home CODE STATUS: Full code  Discharge Instructions     Diet - low sodium heart healthy   Complete by: As directed    Diet Carb Modified   Complete by: As directed    Discharge instructions   Complete by: As directed    You have acute kidney injury likely from dehydration.  Your kidney function is back to normal after IV fluids.  Please hold your Entresto and spironolactone until follow up with cardiology next week.  Your coreg is increased from 6.25 mg to 12.5 mg 2 times daily.  Cardiology office will call you tomorrow to set you up with appointment.  You have only needed 2-3 units of short-acting insulin with meals during the 2 days you are in the hospital, without any long-acting Glargine.  Your A1c was 6.6 about one month ago.  You may not need as much insulin at home as you used to.  Please reduce your Glargine from 36 units to 18 units nightly, and follow up with your outpatient provider. [facility] Course:  For full details, please see H&P, progress notes, consult notes and ancillary notes.  Briefly,  Avishai Reihl. is a 52 y.o. 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    * Chest pain, chronic CAD --trop 40's, similarly mildly elevated as prior --cardio consulted.  Per cardio, chest pain "is chronic and unchanged. He says he always has chest pain and it never goes away. It has not changed in frequency or severity."   --Cardiac cath almost a year ago showed an occluded RCA with left-to-right collaterals and mild to moderate nonobstructive coronary disease of left circumflex and LAD.   --Echo showed no change in EF. --cont statin   Left shoulder pain --not a new problem, is undergoing outpatient workup   Chronic sCHF mixed ischemic/nonischemic cardiomyopathy  --last EF 40 to 45% in December 2023  --hold home Entresto, spironolactone due to AKI pending outpatient cardio f/u --resume home torsemide after discharge, per cardio --increase home coreg to 12.5 mg BID due to Vtach.  Non-sustained Vtach Patient had an asymptomatic 14 beat run of ventricular tachycardia on telemetry but was asymptomatic. He does have ICD in place. Cardio rec increasing carvedilol to 12.5 mg twice daily.    AKI --likely related to volume depletion from overdiuresis PTA.  Baseline Cr 1.5.  Cr 2.68 on presentation, improved with IVF, Cr 1.56 prior to discharge. --hold home Entresto, spironolactone due to AKI pending outpatient cardio f/u.   Dizziness and vertigo --not a new problem.  Already being set up for outpatient ENT.  Symptoms improved after presentation. --orthostatic BP neg.   Chronic obstructive pulmonary disease (COPD) (HCC) --stable   History of pulmonary embolism --cont Xarelto.   ICD (implantable cardioverter-defibrillator) in place   type 2 diabetes mellitus with hyperglycemia, with long-term current use of insulin (HCC) --recent A1c 6.6.  Pt only needed 2-3 units of short-acting insulin with meals during hospitalization without any long-acting Glargine.  --Wonder if pt was on too much insulin at home (glargine 36 u nightly, and 10u mealtime TID), and whether dizziness may be due to  hypoglycemia, however, pt said he has Libre at home and was not aware of frequent hypoglycemic episodes. --Home Glargine reduced from 36 units to 18 units nightly.  Pt advised to followup with outpatient provider for further adjustment.   Tobacco use Nicotine patch.   CPAP (continuous positive airway pressure) dependence --CPAP nightly   Unless noted above, medications under "STOP" list  are ones pt was not taking PTA.  Discharge Diagnoses:  Principal Problem:   Chest pain Active Problems:   Coronary artery disease, non-occlusive   History of pulmonary embolism   Acute kidney injury superimposed on chronic kidney disease (HCC)   Chronic obstructive pulmonary disease (COPD) (HCC)   Chronic systolic CHF (congestive heart failure) (HCC)   ICD (implantable cardioverter-defibrillator) in place   Uncontrolled type 2 diabetes mellitus with hyperglycemia, with long-term current use of insulin (HCC)   Tobacco use   Dizziness   CPAP (continuous positive airway pressure) dependence   30 Day Unplanned Readmission Risk Score    Flowsheet Row ED to Hosp-Admission (Current) from 03/07/2023 in [facility]  30 Day Unplanned Readmission Risk Score (%) 16.98 Filed at 03/09/2023 0801       This score is the patient's risk of an unplanned readmission within 30 days of being discharged (0 -100%). The score is based on dignosis, age, lab data, medications, orders, and past utilization.   Low:  0-14.9   Medium: 15-21.9   High: 22-29.9   Extreme: 30 and above         Discharge Instructions:  Allergies as of 03/09/2023       Reactions   Metformin And Related Nausea And Vomiting   Zantac [ranitidine Hcl] Diarrhea, Nausea Only   Night sweats        Medication List     STOP taking these medications    Furoscix 80 MG/10ML Ctkt Generic drug: Furosemide   metoCLOPramide 5 MG tablet Commonly known as: Reglan       TAKE these medications    albuterol 108 (90 Base) MCG/ACT inhaler Commonly known as: VENTOLIN HFA Inhale 2 puffs into the lungs every 4 (four) hours as needed for wheezing or shortness of breath.   allopurinol 300 MG tablet Commonly known as: ZYLOPRIM Take 300 mg by mouth daily.   Anoro Ellipta 62.5-25 MCG/ACT Aepb Generic drug: umeclidinium-vilanterol Inhale 1 puff into the lungs daily.   azelastine 0.1 % nasal spray Commonly  known as: ASTELIN Place 2 sprays into both nostrils 2 (two) times daily. Use in each nostril as directed   Basaglar KwikPen 100 UNIT/ML Inject 18 Units into the skin at bedtime. Reduced from 38 units. What changed:  how much to take additional instructions   BD Pen Needle Nano 2nd Gen 32G X 4 MM Misc Generic drug: Insulin Pen Needle USE  AS DIRECTED AT BEDTIME   Insulin Pen Needle 32G X 4 MM Misc 1 each by Does not apply route in the morning, at noon, in the evening, and at bedtime.   blood glucose meter kit and supplies Kit Dispense based on patient and insurance preference. Use up to four times daily as directed.   carvedilol 6.25 MG tablet Commonly known as: COREG Take 2 tablets (12.5 mg total) by mouth 2 (two) times daily. Increased from 6.25 mg 2 times daily. What changed:  how much to take additional instructions   cetirizine 10 MG tablet Commonly known as: ZYRTEC Take 1 tablet (10 mg total) by mouth daily.   colchicine  0.6 MG tablet Day 1: Take 2 tablets PO for gout flare, repeat 1 tablet one hour following initial dose. Day 2: Take 1 tablet twice daily PO for an additional 2-3 days.   cyclobenzaprine 5 MG tablet Commonly known as: FLEXERIL Take 1 tablet (5 mg total) by mouth as needed.   dapagliflozin propanediol 10 MG Tabs tablet Commonly known as: FARXIGA Take 1 tablet (10 mg total) by mouth daily.   fenofibrate 145 MG tablet Commonly known as: TRICOR Take 1 tablet (145 mg total) by mouth daily.   FLUoxetine 20 MG capsule Commonly known as: PROZAC Take 1 capsule (20 mg total) by mouth daily.   fluticasone 50 MCG/ACT nasal spray Commonly known as: FLONASE Place 2 sprays into both nostrils daily as needed. Home med. What changed:  when to take this reasons to take this additional instructions   FreeStyle Libre 3 Sensor Misc Place 1 sensor on the skin every 14 days. Use to check glucose continuously   icosapent Ethyl 1 g capsule Commonly known as:  Vascepa Take 2 capsules (2 g total) by mouth 2 (two) times daily.   insulin aspart 100 UNIT/ML FlexPen Commonly known as: NOVOLOG Inject 10 Units into the skin 3 (three) times daily with meals. Inject 10 units + correction before breakfast/lunch and supper (correction = 1 unit per 50 mg/dL above 161); max daily dose 36 units   meclizine 25 MG tablet Commonly known as: ANTIVERT Take 1 tablet (25 mg total) by mouth 3 (three) times daily as needed for dizziness.   nicotine 21 mg/24hr patch Commonly known as: NICODERM CQ - dosed in mg/24 hours Place 1 patch (21 mg total) onto the skin daily. Start taking on: March 10, 2023   nitroGLYCERIN 0.4 MG SL tablet Commonly known as: NITROSTAT Place 1 tablet (0.4 mg total) under the tongue every 5 (five) minutes x 3 doses as needed for chest pain.   omeprazole 40 MG capsule Commonly known as: PRILOSEC Take 1 capsule (40 mg total) by mouth daily.   Ozempic (2 MG/DOSE) 8 MG/3ML Sopn Generic drug: Semaglutide (2 MG/DOSE) INJECT 1 DOSE (2MG ) SUBCUTANEOUSLY  ONCE A WEEK   potassium chloride SA 20 MEQ tablet Commonly known as: KLOR-CON M Take 1 tablet (20 mEq total) by mouth every other day.   rivaroxaban 20 MG Tabs tablet Commonly known as: XARELTO Take 1 tablet (20 mg total) by mouth daily with supper.   rosuvastatin 40 MG tablet Commonly known as: CRESTOR Take 1 tablet (40 mg total) by mouth daily.   sacubitril-valsartan 97-103 MG Commonly known as: ENTRESTO Hold until followup with cardiology due to acute kidney injury. What changed:  how much to take how to take this when to take this additional instructions   spironolactone 25 MG tablet Commonly known as: ALDACTONE Hold until followup with cardiology due to acute kidney injury. What changed:  how much to take how to take this when to take this additional instructions   torsemide 20 MG tablet Commonly known as: DEMADEX Take 2 tablets (40 mg total) by mouth daily.  Alternating days 60 mg and 40 mg.  Home med. What changed: additional instructions         Follow-up Information     Jacky Kindle, FNP Follow up in 1 week(s).   Specialty: Family Medicine Contact information: [address] [place] [address] [phone]         Fransico Michael, Cadence H, PA-C Follow up.   Specialty: Cardiology Contact information: [address] [place] [address] [phone]                 Allergies  Allergen Reactions   Metformin And Related Nausea And Vomiting   Zantac [Ranitidine Hcl] Diarrhea and Nausea Only    Night sweats     The results of significant diagnostics from this hospitalization (including imaging, microbiology, ancillary and laboratory) are listed below for reference.   Consultations:   Procedures/Studies: ECHOCARDIOGRAM COMPLETE  Result Date: 03/08/2023    ECHOCARDIOGRAM REPORT   Patient Name:   Bernie Ransford. Date of Exam: 03/08/2023 Medical Rec #:  [id number]           Height:       72.0 in Accession #:    [id number]          Weight:       250.2 lb Date of Birth:  1971-04-25           BSA:          2.343 m Patient Age:    52 years            BP:           120/71 mmHg Patient Gender: M                   HR:           75 bpm. Exam Location:  [facility] Procedure: 2D Echo and Intracardiac Opacification Agent Indications:     Chest Pain R07.9  History:         Patient has prior history of Echocardiogram examinations, most                  recent 08/14/2022.  Sonographer:     Overton Mam RDCS, FASE Referring Phys:  [id number] Francee Nodal FURTH Diagnosing Phys: Armanda Magic MD  Sonographer Comments: Technically difficult study due to poor echo windows, suboptimal apical window and patient is obese. Image acquisition challenging due to patient body habitus. IMPRESSIONS  1. Left ventricular ejection fraction, by estimation, is 45 to 50%. The left ventricle has mildly decreased function. The left ventricle demonstrates  global hypokinesis. The left ventricular internal cavity size was mildly to moderately dilated. There is moderate concentric left ventricular hypertrophy. Left ventricular diastolic function could not be evaluated.  2. Right ventricular systolic function is normal. The right ventricular size is normal. Tricuspid regurgitation signal is inadequate for assessing PA pressure.  3. The mitral valve is degenerative. Trivial mitral valve regurgitation. No evidence of mitral stenosis.  4. The aortic valve is calcified. Aortic valve regurgitation is mild. Aortic valve sclerosis/calcification is present, without any evidence of aortic stenosis.  5. Aortic dilatation noted. There is mild dilatation of the aortic root, measuring 43 mm. FINDINGS  Left Ventricle: Left ventricular ejection fraction, by estimation, is 45 to 50%. The left ventricle has mildly decreased function. The left ventricle demonstrates global hypokinesis. Definity contrast agent was given IV to delineate the left ventricular  endocardial borders. The left ventricular internal cavity size was mildly to moderately dilated. There is moderate concentric left ventricular hypertrophy. Left ventricular diastolic function could not be evaluated. Right Ventricle: The right ventricular size is normal. No increase in right ventricular wall thickness. Right ventricular systolic function is normal. Tricuspid regurgitation signal is inadequate for assessing PA pressure. Left Atrium: Left atrial size was normal in size. Right Atrium: Right atrial size was normal in size. Pericardium: There is no evidence of  pericardial effusion. Mitral Valve: The mitral valve is degenerative in appearance. There is mild thickening of the mitral valve leaflet(s). Mild to moderate mitral annular calcification. Trivial mitral valve regurgitation. No evidence of mitral valve stenosis. Tricuspid Valve: The tricuspid valve is normal in structure. Tricuspid valve regurgitation is not  demonstrated. No evidence of tricuspid stenosis. Aortic Valve: The aortic valve is calcified. Aortic valve regurgitation is mild. Aortic valve sclerosis/calcification is present, without any evidence of aortic stenosis. Pulmonic Valve: The pulmonic valve was normal in structure. Pulmonic valve regurgitation is not visualized. No evidence of pulmonic stenosis. Aorta: Aortic dilatation noted. There is mild dilatation of the aortic root, measuring 43 mm. Venous: The inferior vena cava was not well visualized. IAS/Shunts: No atrial level shunt detected by color flow Doppler.  LEFT VENTRICLE PLAX 2D LVIDd:         6.10 cm      Diastology LVIDs:         4.45 cm      LV e' medial: 3.37 cm/s LV PW:         1.35 cm LV IVS:        1.45 cm LVOT diam:     2.50 cm LV SV:         81 LV SV Index:   35 LVOT Area:     4.91 cm  LV Volumes (MOD) LV vol d, MOD A4C: 203.0 ml LV vol s, MOD A4C: 97.0 ml LV SV MOD A4C:     203.0 ml LEFT ATRIUM         Index LA diam:    3.90 cm 1.66 cm/m  AORTIC VALVE             PULMONIC VALVE LVOT Vmax:   85.90 cm/s  PV Vmax:        0.83 m/s LVOT Vmean:  61.000 cm/s PV Peak grad:   2.8 mmHg LVOT VTI:    0.166 m     RVOT Peak grad: 1 mmHg  AORTA Ao Root diam: 4.30 cm Ao Asc diam:  3.40 cm  SHUNTS Systemic VTI:  0.17 m Systemic Diam: 2.50 cm Armanda Magic MD Electronically signed by Armanda Magic MD Signature Date/Time: 03/08/2023/5:11:06 PM    Final    US RENAL  Result Date: 03/07/2023 CLINICAL DATA:  Acute kidney injury EXAM: RENAL / URINARY TRACT ULTRASOUND COMPLETE COMPARISON:  None Available. FINDINGS: Right Kidney: Renal measurements: 11.1 x 5.7 x 6.1 cm = volume: 201 mL. Echogenicity within normal limits. No mass or hydronephrosis visualized. Left Kidney: Renal measurements: 11.8 x 5.6 x 4.9 cm = volume: 171 mL. Echogenicity within normal limits. No mass or hydronephrosis visualized. Bladder: Appears normal for degree of bladder distention. Other: None. IMPRESSION: No significant sonographic  abnormality of the kidneys. Electronically Signed   By: Darliss Cheney M.D.   On: 03/07/2023 23:07   DG Chest 2 View  Result Date: 03/07/2023 CLINICAL DATA:  Chest pain, shortness of breath, and dizziness EXAM: CHEST - 2 VIEW COMPARISON:  Chest radiograph dated 12/21/2022 FINDINGS: Lines/tubes: Partially imaged external cardiac defibrillator with lead projecting over the left anterior chest wall. Lungs: Low lung volumes with bronchovascular crowding. Left mid lung linear opacity. No focal consolidations. Pleura: No pneumothorax or pleural effusion. Heart/mediastinum: Similar mildly enlarged cardiomediastinal silhouette. Bones: No acute osseous abnormality. IMPRESSION: Low lung volumes with bronchovascular crowding. Left mid lung linear opacity, likely atelectasis. Electronically Signed   By: Agustin Cree M.D.   On: 03/07/2023 16:48  DG Shoulder Left  Result Date: 03/02/2023 CLINICAL DATA:  Left shoulder pain. EXAM: LEFT SHOULDER - 3 VIEW COMPARISON:  None Available. FINDINGS: There is no evidence of fracture or dislocation. There is no evidence of arthropathy or other focal bone abnormality. Soft tissues are unremarkable. IMPRESSION: Negative left shoulder radiographs. Electronically Signed   By: Marin Roberts M.D.   On: 03/02/2023 15:48   CUP PACEART REMOTE DEVICE CHECK  Result Date: 02/18/2023 Scheduled remote reviewed. Normal device function.  1 AF episode, 1.2 hrs, hx AF on Xarelto per Epic. Next remote 91 days. MC, CVRS.Sensing Configuration: Secondary Gain Setting: 1X Post Shock Pacing: OFF     Labs: BNP (last 3 results) Recent Labs    10/21/22 0922 12/21/22 0859 03/07/23 1724  BNP 39.9 34.0 104.6*   Basic Metabolic Panel: Recent Labs  Lab 03/07/23 1558 03/08/23 0353 03/09/23 0413  NA 135 139 136  K 3.7 3.3* 4.3  CL 99 103 104  CO2 20* 25 25  GLUCOSE 260* 186* 159*  BUN 34* 35* 31*  CREATININE 2.68* 2.07* 1.56*  CALCIUM 9.4 8.6* 9.0  MG  --  2.0 2.1   Liver Function  Tests: Recent Labs  Lab 03/07/23 1758 03/08/23 0353  AST 20 17  ALT 29 28  ALKPHOS 58 60  BILITOT 1.2 1.4*  PROT 6.9 6.6  ALBUMIN 4.0 3.8   No results for input(s): "LIPASE", "AMYLASE" in the last 168 hours. No results for input(s): "AMMONIA" in the last 168 hours. CBC: Recent Labs  Lab 03/07/23 1558 03/08/23 0353 03/09/23 0413  WBC 11.5* 9.2 7.7  HGB 17.9* 16.3 15.4  HCT 53.6* 47.9 45.9  MCV 82.2 82.6 83.3  PLT 236 165 158   Cardiac Enzymes: Recent Labs  Lab 03/07/23 1758  CKTOTAL 70   BNP: Invalid input(s): "POCBNP" CBG: Recent Labs  Lab 03/08/23 0724 03/08/23 1223 03/08/23 1624 03/08/23 2100 03/09/23 0808  GLUCAP 172* 123* 156* 172* 123*   D-Dimer Recent Labs    03/07/23 1724  DDIMER <0.27   Hgb A1c No results for input(s): "HGBA1C" in the last 72 hours. Lipid Profile No results for input(s): "CHOL", "HDL", "LDLCALC", "TRIG", "CHOLHDL", "LDLDIRECT" in the last 72 hours. Thyroid function studies Recent Labs    03/08/23 0353  TSH 1.508   Anemia work up No results for input(s): "VITAMINB12", "FOLATE", "FERRITIN", "TIBC", "IRON", "RETICCTPCT" in the last 72 hours. Urinalysis    Component Value Date/Time   COLORURINE YELLOW (A) 01/17/2022 1953   APPEARANCEUR CLEAR (A) 01/17/2022 1953   APPEARANCEUR Clear 03/08/2020 0951   LABSPEC 1.012 01/17/2022 1953   LABSPEC 1.019 11/12/2012 1713   PHURINE 5.0 01/17/2022 1953   GLUCOSEU >=500 (A) 01/17/2022 1953   GLUCOSEU Negative 11/12/2012 1713   HGBUR NEGATIVE 01/17/2022 1953   BILIRUBINUR NEGATIVE 01/17/2022 1953   BILIRUBINUR Negative 03/08/2020 0951   BILIRUBINUR Negative 11/12/2012 1713   KETONESUR NEGATIVE 01/17/2022 1953   PROTEINUR NEGATIVE 01/17/2022 1953   NITRITE NEGATIVE 01/17/2022 1953   LEUKOCYTESUR NEGATIVE 01/17/2022 1953   LEUKOCYTESUR Negative 11/12/2012 1713   Sepsis Labs Recent Labs  Lab 03/07/23 1558 03/08/23 0353 03/09/23 0413  WBC 11.5* 9.2 7.7   Microbiology Recent  Results (from the past 240 hour(s))  SARS Coronavirus 2 by RT PCR (hospital order, performed in [facility] hospital lab) *cepheid single result test* Anterior Nasal Swab     Status: None   Collection Time: 03/07/23  8:03 PM   Specimen: Anterior Nasal Swab  Result Value Ref  Range Status   SARS Coronavirus 2 by RT PCR NEGATIVE NEGATIVE Final    Comment: (NOTE) SARS-CoV-2 target nucleic acids are NOT DETECTED.  The SARS-CoV-2 RNA is generally detectable in upper and lower respiratory specimens during the acute phase of infection. The lowest concentration of SARS-CoV-2 viral copies this assay can detect is 250 copies / mL. A negative result does not preclude SARS-CoV-2 infection and should not be used as the sole basis for treatment or other patient management decisions.  A negative result may occur with improper specimen collection / handling, submission of specimen other than nasopharyngeal swab, presence of viral mutation(s) within the areas targeted by this assay, and inadequate number of viral copies (<250 copies / mL). A negative result must be combined with clinical observations, patient history, and epidemiological information.  Fact Sheet for Patients:   RoadLapTop.co.za  Fact Sheet for Healthcare Providers: http://kim-miller.com/  This test is not yet approved or  cleared by the [place] FDA and has been authorized for detection and/or diagnosis of SARS-CoV-2 by FDA under an Emergency Use Authorization (EUA).  This EUA will remain in effect (meaning this test can be used) for the duration of the COVID-19 declaration under Section 564(b)(1) of the Act, 21 U.S.C. section 360bbb-3(b)(1), unless the authorization is terminated or revoked sooner.  Performed at [facility], [address] [place], [place] [address]      Total time spend on discharging this patient, including the last patient exam, discussing the  hospital stay, instructions for ongoing care as it relates to all pertinent caregivers, as well as preparing the medical discharge records, prescriptions, and/or referrals as applicable, is 45 minutes.    Darlin Priestly, MD  [facility] 03/09/2023, 11:04 AM

## 2023-03-09 NOTE — Plan of Care (Signed)

## 2023-03-09 NOTE — Progress Notes (Signed)
Rounding Note    Patient Name: Gabriel [name]. Date of Encounter: 03/09/2023  [facility] Cardiologist: Julien Nordmann, MD   Subjective   Blood pressures are improving. Kidney function is back to baseline. The patient is overall feeling better. Possible d/c today.  Inpatient Medications    Scheduled Meds:  carvedilol  6.25 mg Oral BID   FLUoxetine  20 mg Oral Daily   insulin aspart  0-15 Units Subcutaneous TID WC   nicotine  21 mg Transdermal Daily   pneumococcal 23 valent vaccine  0.5 mL Intramuscular Tomorrow-1000   rivaroxaban  20 mg Oral Q supper   rosuvastatin  40 mg Oral Daily   sodium chloride flush  3 mL Intravenous Q12H   Continuous Infusions:  PRN Meds: acetaminophen **OR** acetaminophen, albuterol, nitroGLYCERIN, mouth rinse, traMADol   Vital Signs    Vitals:   03/08/23 2038 03/08/23 2332 03/09/23 0427 03/09/23 0823  BP: (!) 155/83 132/82 127/68 (!) 143/89  Pulse: 82 76 68 72  Resp: 19 16 16 16   Temp: 98.4 F (36.9 C) 98.4 F (36.9 C) 98.6 F (37 C) 98.3 F (36.8 C)  TempSrc: Oral Oral Oral Oral  SpO2: 97% 93% 97% 96%  Weight:      Height:        Intake/Output Summary (Last 24 hours) at 03/09/2023 0838 Last data filed at 03/09/2023 0400 Gross per 24 hour  Intake 1012.76 ml  Output 1000 ml  Net 12.76 ml      03/08/2023   12:21 AM 03/07/2023    3:56 PM 03/02/2023    1:38 PM  Last 3 Weights  Weight (lbs) 250 lb 3.2 oz 249 lb 1.9 oz 250 lb  Weight (kg) 113.49 kg 113 kg 113.399 kg      Telemetry    N/A - Personally Reviewed  ECG    No new - Personally Reviewed  Physical Exam   GEN: No acute distress.   Neck: No JVD Cardiac: RRR, no murmurs, rubs, or gallops.  Respiratory: Clear to auscultation bilaterally. GI: Soft, nontender, non-distended  MS: No edema; No deformity. Neuro:  Nonfocal  Psych: Normal affect   Labs    High Sensitivity Troponin:   Recent Labs  Lab 03/07/23 1558 03/07/23 1758 03/07/23 2222  03/08/23 0101  TROPONINIHS 57* 42* 44* 43*     Chemistry Recent Labs  Lab 03/07/23 1558 03/07/23 1758 03/08/23 0353 03/09/23 0413  NA 135  --  139 136  K 3.7  --  3.3* 4.3  CL 99  --  103 104  CO2 20*  --  25 25  GLUCOSE 260*  --  186* 159*  BUN 34*  --  35* 31*  CREATININE 2.68*  --  2.07* 1.56*  CALCIUM 9.4  --  8.6* 9.0  MG  --   --  2.0 2.1  PROT  --  6.9 6.6  --   ALBUMIN  --  4.0 3.8  --   AST  --  20 17  --   ALT  --  29 28  --   ALKPHOS  --  58 60  --   BILITOT  --  1.2 1.4*  --   GFRNONAA 27*  --  37* 52*  ANIONGAP 16*  --  11 7    Lipids No results for input(s): "CHOL", "TRIG", "HDL", "LABVLDL", "LDLCALC", "CHOLHDL" in the last 168 hours.  Hematology Recent Labs  Lab 03/07/23 1558 03/08/23 0353 03/09/23 0413  WBC  11.5* 9.2 7.7  RBC 6.52* 5.80 5.51  HGB 17.9* 16.3 15.4  HCT 53.6* 47.9 45.9  MCV 82.2 82.6 83.3  MCH 27.5 28.1 27.9  MCHC 33.4 34.0 33.6  RDW 15.3 14.7 14.3  PLT 236 165 158   Thyroid  Recent Labs  Lab 03/08/23 0353  TSH 1.508  FREET4 1.01    BNP Recent Labs  Lab 03/07/23 1724  BNP 104.6*    DDimer  Recent Labs  Lab 03/07/23 1724  DDIMER <0.27     Radiology    ECHOCARDIOGRAM COMPLETE  Result Date: 03/08/2023    ECHOCARDIOGRAM REPORT   Patient Name:   Gabriel [name]. Date of Exam: 03/08/2023 Medical Rec #:  [id number]           Height:       72.0 in Accession #:    [id number]          Weight:       250.2 lb Date of Birth:  1970/10/21           BSA:          2.343 m Patient Age:    52 years            BP:           120/71 mmHg Patient Gender: M                   HR:           75 bpm. Exam Location:  [facility] Procedure: 2D Echo and Intracardiac Opacification Agent Indications:     Chest Pain R07.9  History:         Patient has prior history of Echocardiogram examinations, most                  recent 08/14/2022.  Sonographer:     Overton Mam RDCS, FASE Referring Phys:  [id number] Francee Nodal Jabria Loos Diagnosing Phys: Armanda Magic MD   Sonographer Comments: Technically difficult study due to poor echo windows, suboptimal apical window and patient is obese. Image acquisition challenging due to patient body habitus. IMPRESSIONS  1. Left ventricular ejection fraction, by estimation, is 45 to 50%. The left ventricle has mildly decreased function. The left ventricle demonstrates global hypokinesis. The left ventricular internal cavity size was mildly to moderately dilated. There is moderate concentric left ventricular hypertrophy. Left ventricular diastolic function could not be evaluated.  2. Right ventricular systolic function is normal. The right ventricular size is normal. Tricuspid regurgitation signal is inadequate for assessing PA pressure.  3. The mitral valve is degenerative. Trivial mitral valve regurgitation. No evidence of mitral stenosis.  4. The aortic valve is calcified. Aortic valve regurgitation is mild. Aortic valve sclerosis/calcification is present, without any evidence of aortic stenosis.  5. Aortic dilatation noted. There is mild dilatation of the aortic root, measuring 43 mm. FINDINGS  Left Ventricle: Left ventricular ejection fraction, by estimation, is 45 to 50%. The left ventricle has mildly decreased function. The left ventricle demonstrates global hypokinesis. Definity contrast agent was given IV to delineate the left ventricular  endocardial borders. The left ventricular internal cavity size was mildly to moderately dilated. There is moderate concentric left ventricular hypertrophy. Left ventricular diastolic function could not be evaluated. Right Ventricle: The right ventricular size is normal. No increase in right ventricular wall thickness. Right ventricular systolic function is normal. Tricuspid regurgitation signal is inadequate for assessing PA pressure. Left Atrium: Left atrial size was normal in size.  Right Atrium: Right atrial size was normal in size. Pericardium: There is no evidence of pericardial effusion.  Mitral Valve: The mitral valve is degenerative in appearance. There is mild thickening of the mitral valve leaflet(s). Mild to moderate mitral annular calcification. Trivial mitral valve regurgitation. No evidence of mitral valve stenosis. Tricuspid Valve: The tricuspid valve is normal in structure. Tricuspid valve regurgitation is not demonstrated. No evidence of tricuspid stenosis. Aortic Valve: The aortic valve is calcified. Aortic valve regurgitation is mild. Aortic valve sclerosis/calcification is present, without any evidence of aortic stenosis. Pulmonic Valve: The pulmonic valve was normal in structure. Pulmonic valve regurgitation is not visualized. No evidence of pulmonic stenosis. Aorta: Aortic dilatation noted. There is mild dilatation of the aortic root, measuring 43 mm. Venous: The inferior vena cava was not well visualized. IAS/Shunts: No atrial level shunt detected by color flow Doppler.  LEFT VENTRICLE PLAX 2D LVIDd:         6.10 cm      Diastology LVIDs:         4.45 cm      LV e' medial: 3.37 cm/s LV PW:         1.35 cm LV IVS:        1.45 cm LVOT diam:     2.50 cm LV SV:         81 LV SV Index:   35 LVOT Area:     4.91 cm  LV Volumes (MOD) LV vol d, MOD A4C: 203.0 ml LV vol s, MOD A4C: 97.0 ml LV SV MOD A4C:     203.0 ml LEFT ATRIUM         Index LA diam:    3.90 cm 1.66 cm/m  AORTIC VALVE             PULMONIC VALVE LVOT Vmax:   85.90 cm/s  PV Vmax:        0.83 m/s LVOT Vmean:  61.000 cm/s PV Peak grad:   2.8 mmHg LVOT VTI:    0.166 m     RVOT Peak grad: 1 mmHg  AORTA Ao Root diam: 4.30 cm Ao Asc diam:  3.40 cm  SHUNTS Systemic VTI:  0.17 m Systemic Diam: 2.50 cm Armanda Magic MD Electronically signed by Armanda Magic MD Signature Date/Time: 03/08/2023/5:11:06 PM    Final    US RENAL  Result Date: 03/07/2023 CLINICAL DATA:  Acute kidney injury EXAM: RENAL / URINARY TRACT ULTRASOUND COMPLETE COMPARISON:  None Available. FINDINGS: Right Kidney: Renal measurements: 11.1 x 5.7 x 6.1 cm = volume:  201 mL. Echogenicity within normal limits. No mass or hydronephrosis visualized. Left Kidney: Renal measurements: 11.8 x 5.6 x 4.9 cm = volume: 171 mL. Echogenicity within normal limits. No mass or hydronephrosis visualized. Bladder: Appears normal for degree of bladder distention. Other: None. IMPRESSION: No significant sonographic abnormality of the kidneys. Electronically Signed   By: Darliss Cheney M.D.   On: 03/07/2023 23:07   DG Chest 2 View  Result Date: 03/07/2023 CLINICAL DATA:  Chest pain, shortness of breath, and dizziness EXAM: CHEST - 2 VIEW COMPARISON:  Chest radiograph dated 12/21/2022 FINDINGS: Lines/tubes: Partially imaged external cardiac defibrillator with lead projecting over the left anterior chest wall. Lungs: Low lung volumes with bronchovascular crowding. Left mid lung linear opacity. No focal consolidations. Pleura: No pneumothorax or pleural effusion. Heart/mediastinum: Similar mildly enlarged cardiomediastinal silhouette. Bones: No acute osseous abnormality. IMPRESSION: Low lung volumes with bronchovascular crowding. Left mid lung linear opacity, likely atelectasis. Electronically  Signed   By: Agustin Cree M.D.   On: 03/07/2023 16:48    Cardiac Studies   Echo 07/2022 1. Left ventricular ejection fraction, by estimation, is 40 to 45%. The  left ventricle has mildly decreased function. The left ventricle  demonstrates regional wall motion abnormalities (see scoring  diagram/findings for description). The left ventricular   internal cavity size was mildly dilated. There is mild left ventricular  hypertrophy. Left ventricular diastolic parameters are consistent with  Grade I diastolic dysfunction (impaired relaxation). There is severe  hypokinesis of the left ventricular,  entire inferior wall and inferolateral wall.   2. Right ventricular systolic function is normal. The right ventricular  size is normal. Tricuspid regurgitation signal is inadequate for assessing  PA pressure.    3. Left atrial size was mildly dilated.   4. The mitral valve is normal in structure. No evidence of mitral valve  regurgitation. No evidence of mitral stenosis.   5. The aortic valve is normal in structure. Aortic valve regurgitation is  mild. Aortic valve sclerosis/calcification is present, without any  evidence of aortic stenosis.   6. Aortic dilatation noted. There is mild dilatation of the aortic root,  measuring 40 mm.   Comparison(s): A prior study was performed on 11/24/2021. Changes from prior  study are noted. The left ventricular function has improved.    Limited echo 01/2022  1. Left ventricular ejection fraction, by estimation, is 30 to 35%. The  left ventricle has moderate to severely decreased function. Left  ventricular diastolic parameters are consistent with Grade II diastolic  dysfunction (pseudonormalization).   2. Aortic valve regurgitation is mild to moderate.    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  The distal vessel fills via left to right collaterals. Mild to moderate, nonobstructive CAD involving the LAD and LCx with up to 50% stenosis. Mildly elevated left and right heart filling pressures. Moderate pulmonary hypertension (mean PAP 40 mmHg) with significantly elevated pulmonary vascular resistance (PVR 7.4 WU). Severely reduced Fick cardiac output/index.   Recommendations: Continue IV diuresis for at least 1 more day. Given severely reduced cardiac output, I will decrease carvedilol to 6.5 mg twice daily.  If blood pressure tolerates and renal function is stable, consider further escalation of Entresto. Restart IV heparin 2 hours after TR band removal.  If no evidence of bleeding or vascular complication, the patient can be transitioned back to [facility] tomorrow. Strongly encourage patient to follow-up with the advanced heart failure clinic given his significant mixed  ischemic and nonischemic cardiomyopathy and severe pulmonary hypertension.  He was evaluated by Dr. Shirlee Latch in June but failed to follow-up since then. Aggressive secondary prevention of coronary artery disease.  Favor medical management of CTO of RCA, which is new since 2018.   Yvonne Kendall, MD [facility]   Echo 05/2020  1. Left ventricular ejection fraction, by estimation, is 35 to 40%. The  left ventricle has moderately decreased function. The left ventricle  demonstrates global hypokinesis. The left ventricular internal cavity size  was moderately dilated. Left  ventricular diastolic parameters are indeterminate.   2. Right ventricular systolic function is normal. The right ventricular  size is normal. There is mildly elevated pulmonary artery systolic  pressure. The estimated right ventricular systolic pressure is 44.7 mmHg.   3. Left atrial size was moderately dilated.   4. Mild to moderate mitral valve regurgitation.      Patient Profile  52 y.o. 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 HE reported chronic chest pain and minimal SOB - PTA entresto, torsemide, farxiga, spiro and Coreg held - S/p IVF - kidney function is improving 2.68>2.07>1.56. Baseline around 1.5 - Blood pressures improving - orthostatics negative - Echo showed LVEF 45-50%, global HK, normal RVSF, trivial MR - Coreg restarted. I will restart Marcelline Deist and Torsemide   Chest pain CAD - patient has a history of chronic chest pain and elevated troponin, levels so far stable - HS trop trend not consistent with ACS - he  reports his chronic chest pain - would not start IV heparin - LHC 2021 showed CTO pRCA with L>R collaterals, mild to mod nonobstructive CAD - echo showed stable pump function - continue medical management with fenofibrate, Vascepa and Crestor   HFrEF Mixed ICM/NICM S/p ICD - Echo 07/2022 showed LVEF 40-45%, mild LVH, G1DD, severe HK.EF as low as 30-35% in the past - PTA Farxiga 10mg  daily, Entresto 97-103mg  BID, spironolactone 25mg  daily, torsemide alternating 40/60mg  daily, Coreg 6.25mg  BID held for hypotension and AKI - Patient is euvolemic on exam - AKI/BP improving - Echo this admission showed LVEF 45-50% - supplement K - Coreg 6.25mg  BID restarted. I will restart Marcelline Deist and  home torsemide - He may not tolerate high dose Entresto   H/o pulmonary embolism - continue Xarelto  For questions or updates, please contact [facility] Please consult [email or web address] for contact info under        Signed, Miranda Garber David Stall, PA-C  03/09/2023, 8:38 AM

## 2023-03-09 NOTE — Plan of Care (Signed)
Problem: Education: Goal: Ability to describe self-care measures that may prevent or decrease complications (Diabetes Survival Skills Education) will improve 03/09/2023 0048 by Evalee Jefferson, RN Outcome: Progressing 03/09/2023 0048 by Evalee Jefferson, RN Outcome: Progressing Goal: Individualized Educational Video(s) 03/09/2023 0048 by Evalee Jefferson, RN Outcome: Progressing 03/09/2023 0048 by Evalee Jefferson, RN Outcome: Progressing   Problem: Coping: Goal: Ability to adjust to condition or change in health will improve 03/09/2023 0048 by Evalee Jefferson, RN Outcome: Progressing 03/09/2023 0048 by Evalee Jefferson, RN Outcome: Progressing   Problem: Fluid Volume: Goal: Ability to maintain a balanced intake and output will improve 03/09/2023 0048 by Evalee Jefferson, RN Outcome: Progressing 03/09/2023 0048 by Evalee Jefferson, RN Outcome: Progressing   Problem: Health Behavior/Discharge Planning: Goal: Ability to identify and utilize available resources and services will improve 03/09/2023 0048 by Evalee Jefferson, RN Outcome: Progressing 03/09/2023 0048 by Evalee Jefferson, RN Outcome: Progressing Goal: Ability to manage health-related needs will improve 03/09/2023 0048 by Evalee Jefferson, RN Outcome: Progressing 03/09/2023 0048 by Evalee Jefferson, RN Outcome: Progressing   Problem: Metabolic: Goal: Ability to maintain appropriate glucose levels will improve 03/09/2023 0048 by Evalee Jefferson, RN Outcome: Progressing 03/09/2023 0048 by Evalee Jefferson, RN Outcome: Progressing   Problem: Nutritional: Goal: Maintenance of adequate nutrition will improve 03/09/2023 0048 by Evalee Jefferson, RN Outcome: Progressing 03/09/2023 0048 by Evalee Jefferson, RN Outcome: Progressing Goal: Progress toward achieving an optimal weight will improve 03/09/2023 0048 by Evalee Jefferson, RN Outcome: Progressing 03/09/2023 0048 by Evalee Jefferson, RN Outcome:  Progressing   Problem: Skin Integrity: Goal: Risk for impaired skin integrity will decrease 03/09/2023 0048 by Evalee Jefferson, RN Outcome: Progressing 03/09/2023 0048 by Evalee Jefferson, RN Outcome: Progressing   Problem: Tissue Perfusion: Goal: Adequacy of tissue perfusion will improve 03/09/2023 0048 by Evalee Jefferson, RN Outcome: Progressing 03/09/2023 0048 by Evalee Jefferson, RN Outcome: Progressing   Problem: Education: Goal: Knowledge of General Education information will improve Description: Including pain rating scale, medication(s)/side effects and non-pharmacologic comfort measures 03/09/2023 0048 by Evalee Jefferson, RN Outcome: Progressing 03/09/2023 0048 by Evalee Jefferson, RN Outcome: Progressing   Problem: Health Behavior/Discharge Planning: Goal: Ability to manage health-related needs will improve 03/09/2023 0048 by Evalee Jefferson, RN Outcome: Progressing 03/09/2023 0048 by Evalee Jefferson, RN Outcome: Progressing   Problem: Clinical Measurements: Goal: Ability to maintain clinical measurements within normal limits will improve 03/09/2023 0048 by Evalee Jefferson, RN Outcome: Progressing 03/09/2023 0048 by Evalee Jefferson, RN Outcome: Progressing Goal: Will remain free from infection 03/09/2023 0048 by Evalee Jefferson, RN Outcome: Progressing 03/09/2023 0048 by Evalee Jefferson, RN Outcome: Progressing Goal: Diagnostic test results will improve 03/09/2023 0048 by Evalee Jefferson, RN Outcome: Progressing 03/09/2023 0048 by Evalee Jefferson, RN Outcome: Progressing Goal: Respiratory complications will improve 03/09/2023 0048 by Evalee Jefferson, RN Outcome: Progressing 03/09/2023 0048 by Evalee Jefferson, RN Outcome: Progressing Goal: Cardiovascular complication will be avoided 03/09/2023 0048 by Evalee Jefferson, RN Outcome: Progressing 03/09/2023 0048 by Evalee Jefferson, RN Outcome: Progressing   Problem: Activity: Goal: Risk  for activity intolerance will decrease 03/09/2023 0048 by Evalee Jefferson, RN Outcome: Progressing 03/09/2023 0048 by Evalee Jefferson, RN Outcome: Progressing   Problem: Nutrition: Goal: Adequate nutrition will be maintained 03/09/2023 0048 by Evalee Jefferson, RN Outcome: Progressing 03/09/2023 0048 by Evalee Jefferson, RN Outcome: Progressing  Problem: Coping: Goal: Level of anxiety will decrease 03/09/2023 0048 by Evalee Jefferson, RN Outcome: Progressing 03/09/2023 0048 by Evalee Jefferson, RN Outcome: Progressing   Problem: Elimination: Goal: Will not experience complications related to bowel motility 03/09/2023 0048 by Evalee Jefferson, RN Outcome: Progressing 03/09/2023 0048 by Evalee Jefferson, RN Outcome: Progressing Goal: Will not experience complications related to urinary retention 03/09/2023 0048 by Evalee Jefferson, RN Outcome: Progressing 03/09/2023 0048 by Evalee Jefferson, RN Outcome: Progressing   Problem: Pain Managment: Goal: General experience of comfort will improve 03/09/2023 0048 by Evalee Jefferson, RN Outcome: Progressing 03/09/2023 0048 by Evalee Jefferson, RN Outcome: Progressing   Problem: Safety: Goal: Ability to remain free from injury will improve 03/09/2023 0048 by Evalee Jefferson, RN Outcome: Progressing 03/09/2023 0048 by Evalee Jefferson, RN Outcome: Progressing   Problem: Skin Integrity: Goal: Risk for impaired skin integrity will decrease 03/09/2023 0048 by Evalee Jefferson, RN Outcome: Progressing 03/09/2023 0048 by Evalee Jefferson, RN Outcome: Progressing

## 2023-03-10 ENCOUNTER — Telehealth: Payer: Self-pay

## 2023-03-10 NOTE — Transitions of Care (Post Inpatient/ED Visit) (Signed)
03/10/2023  Name: Gabriel [name]. MRN: [id number] DOB: 02-04-1971  Today's TOC FU Call Status: Today's TOC FU Call Status:: Successful TOC FU Call Competed TOC FU Call Complete Date: 03/10/23  Transition Care Management Follow-up Telephone Call Date of Discharge: 03/09/23 Discharge Facility: [facility]) Type of Discharge: Inpatient Admission Primary Inpatient Discharge Diagnosis:: dizziness How have you been since you were released from the hospital?: Better Any questions or concerns?: No  Items Reviewed: Did you receive and understand the discharge instructions provided?: Yes Medications obtained,verified, and reconciled?: Yes (Medications Reviewed) Any new allergies since your discharge?: No Dietary orders reviewed?: Yes Do you have support at home?: Yes People in Home: parent(s)  Medications Reviewed Today: Medications Reviewed Today     Reviewed by Karena Addison, LPN (Licensed Practical Nurse) on 03/10/23 at 1223  Med List Status: <None>   Medication Order Taking? Sig Documenting Provider Last Dose Status Informant  albuterol (VENTOLIN HFA) 108 (90 Base) MCG/ACT inhaler [id number] No Inhale 2 puffs into the lungs every 4 (four) hours as needed for wheezing or shortness of breath. Raechel Chute, MD Taking Active   allopurinol (ZYLOPRIM) 300 MG tablet [id number] No Take 300 mg by mouth daily. [provider] Taking Active   azelastine (ASTELIN) 0.1 % nasal spray [id number] No Place 2 sprays into both nostrils 2 (two) times daily. Use in each nostril as directed Jacky Kindle, FNP Taking Active   BD PEN NEEDLE NANO 2ND GEN 32G X 4 MM MISC [id number] No USE  AS DIRECTED AT BEDTIME Jacky Kindle, FNP Taking Active   blood glucose meter kit and supplies KIT [id number] No Dispense based on patient and insurance preference. Use up to four times daily as directed. Jacky Kindle, FNP Taking Active   carvedilol (COREG) 6.25 MG tablet [id number]   Take 2 tablets (12.5 mg total) by mouth 2 (two) times daily. Increased from 6.25 mg 2 times daily. Darlin Priestly, MD  Active   cetirizine (ZYRTEC) 10 MG tablet [id number] No Take 1 tablet (10 mg total) by mouth daily. Jacky Kindle, FNP Taking Active   colchicine 0.6 MG tablet [id number] No Day 1: Take 2 tablets PO for gout flare, repeat 1 tablet one hour following initial dose. Day 2: Take 1 tablet twice daily PO for an additional 2-3 days. Jacky Kindle, FNP Taking Active   Continuous Blood Gluc Sensor (FREESTYLE LIBRE 3 SENSOR) [place] [id number] No Place 1 sensor on the skin every 14 days. Use to check glucose continuously Erasmo Downer, MD Taking Active   cyclobenzaprine (FLEXERIL) 5 MG tablet [id number]  Take 1 tablet (5 mg total) by mouth as needed. Romeo Apple, Myah A, PA-C  Active   dapagliflozin propanediol (FARXIGA) 10 MG TABS tablet [id number] No Take 1 tablet (10 mg total) by mouth daily. Delma Freeze, FNP Taking Active   fenofibrate (TRICOR) 145 MG tablet [id number] No Take 1 tablet (145 mg total) by mouth daily. Delma Freeze, FNP Taking Active   FLUoxetine (PROZAC) 20 MG capsule [id number] No Take 1 capsule (20 mg total) by mouth daily. Jacky Kindle, FNP Taking Active   fluticasone [facility]) 50 MCG/ACT nasal spray [id number]  Place 2 sprays into both nostrils daily as needed. Home med. Darlin Priestly, MD  Active   icosapent Ethyl (VASCEPA) 1 g capsule [id number] No Take 2 capsules (2 g total) by mouth 2 (two) times daily. Jacky Kindle, FNP Taking Active   insulin aspart (NOVOLOG) 100  UNIT/ML FlexPen [id number] No Inject 10 Units into the skin 3 (three) times daily with meals. Inject 10 units + correction before breakfast/lunch and supper (correction = 1 unit per 50 mg/dL above 409); max daily dose 36 units Merita Norton T, FNP Taking Active   Insulin Glargine (BASAGLAR KWIKPEN) 100 UNIT/ML [id number]  Inject 18 Units into the skin at bedtime. Reduced from 38 units. Darlin Priestly, MD  Active    Discontinued 01/21/22 1612 (Change in therapy)            Med Note Raynald Kemp Feb 14, 2022  2:37 PM)    Insulin Pen Needle 32G X 4 MM MISC [id number] No 1 each by Does not apply route in the morning, at noon, in the evening, and at bedtime. Jacky Kindle, FNP Taking Active   meclizine (ANTIVERT) 25 MG tablet [id number] No Take 1 tablet (25 mg total) by mouth 3 (three) times daily as needed for dizziness. Antonieta Iba, MD Taking Active   nicotine (NICODERM CQ - DOSED IN MG/24 HOURS) 21 mg/24hr patch [id number]  Place 1 patch (21 mg total) onto the skin daily. Darlin Priestly, MD  Active   nitroGLYCERIN (NITROSTAT) 0.4 MG SL tablet [id number] No Place 1 tablet (0.4 mg total) under the tongue every 5 (five) minutes x 3 doses as needed for chest pain. Jacky Kindle, FNP Taking Active   omeprazole (PRILOSEC) 40 MG capsule [id number] No Take 1 capsule (40 mg total) by mouth daily. Toney Reil, MD Taking Active   OZEMPIC, 2 MG/DOSE, 8 MG/3ML SOPN [id number] No INJECT 1 DOSE (2MG ) SUBCUTANEOUSLY  ONCE A WEEK Jacky Kindle, FNP Taking Active   potassium chloride SA (KLOR-CON M) 20 MEQ tablet [id number] No Take 1 tablet (20 mEq total) by mouth every other day. Bensimhon, Bevelyn Buckles, MD Taking Active   rivaroxaban (XARELTO) 20 MG TABS tablet [id number] No Take 1 tablet (20 mg total) by mouth daily with supper. Graciella Freer, PA-C Taking Active            Med Note Clearance Coots, Alanda Slim May 30, 2022  1:36 PM)    rosuvastatin (CRESTOR) 40 MG tablet [id number] No Take 1 tablet (40 mg total) by mouth daily. Antonieta Iba, MD Taking Active   sacubitril-valsartan [facility]) 97-103 MG [id number]  Hold until followup with cardiology due to acute kidney injury. Darlin Priestly, MD  Active   spironolactone (ALDACTONE) 25 MG tablet [id number]  Hold until followup with cardiology due to acute kidney injury. Darlin Priestly, MD  Active   torsemide (DEMADEX) 20 MG tablet [id number]  Take 2 tablets (40 mg  total) by mouth daily. Alternating days 60 mg and 40 mg.  Home med. Darlin Priestly, MD  Active   umeclidinium-vilanterol [facility] ELLIPTA) 62.5-25 MCG/ACT AEPB [id number]  Inhale 1 puff into the lungs daily. Raechel Chute, MD  Active             Home Care and Equipment/Supplies: Were Home Health Services Ordered?: NA Any new equipment or medical supplies ordered?: NA  Functional Questionnaire: Do you need assistance with bathing/showering or dressing?: No Do you need assistance with meal preparation?: No Do you need assistance with eating?: No Do you have difficulty maintaining continence: No Do you need assistance with getting out of bed/getting out of a chair/moving?: No Do you have difficulty managing or taking your medications?: No  Follow up appointments reviewed: PCP Follow-up appointment confirmed?: Yes Date of PCP  follow-up appointment?: 03/11/23 Follow-up Provider: [facility] Follow-up appointment confirmed?: Yes Date of Specialist follow-up appointment?: 03/12/23 Follow-Up Specialty Provider:: cardio Do you need transportation to your follow-up appointment?: No Do you understand care options if your condition(s) worsen?: Yes-patient verbalized understanding    SIGNATURE Karena Addison, LPN [facility] Nurse Health Advisor Direct Dial [phone]

## 2023-03-11 ENCOUNTER — Ambulatory Visit: Payer: Medicaid Other | Admitting: Family Medicine

## 2023-03-11 ENCOUNTER — Encounter: Payer: Self-pay | Admitting: Family Medicine

## 2023-03-11 VITALS — BP 128/79 | HR 93 | Wt 249.4 lb

## 2023-03-11 DIAGNOSIS — Z09 Encounter for follow-up examination after completed treatment for conditions other than malignant neoplasm: Secondary | ICD-10-CM | POA: Diagnosis not present

## 2023-03-11 DIAGNOSIS — E1169 Type 2 diabetes mellitus with other specified complication: Secondary | ICD-10-CM | POA: Diagnosis not present

## 2023-03-11 DIAGNOSIS — E785 Hyperlipidemia, unspecified: Secondary | ICD-10-CM

## 2023-03-11 DIAGNOSIS — E662 Morbid (severe) obesity with alveolar hypoventilation: Secondary | ICD-10-CM | POA: Diagnosis not present

## 2023-03-11 DIAGNOSIS — E66811 Obesity, class 1: Secondary | ICD-10-CM | POA: Insufficient documentation

## 2023-03-11 DIAGNOSIS — M778 Other enthesopathies, not elsewhere classified: Secondary | ICD-10-CM | POA: Diagnosis not present

## 2023-03-11 DIAGNOSIS — Z6833 Body mass index (BMI) 33.0-33.9, adult: Secondary | ICD-10-CM | POA: Diagnosis not present

## 2023-03-11 MED ORDER — NALTREXONE HCL 50 MG PO TABS: 50.0000 mg | ORAL_TABLET | Freq: Every day | ORAL | 2 refills | Status: DC

## 2023-03-11 MED ORDER — BASAGLAR KWIKPEN 100 UNIT/ML ~~LOC~~ SOPN
18.0000 [IU] | PEN_INJECTOR | Freq: Every evening | SUBCUTANEOUS | 3 refills | Status: DC
Start: 2023-03-11 — End: 2023-06-27

## 2023-03-11 MED ORDER — INSULIN ASPART 100 UNIT/ML FLEXPEN
10.0000 [IU] | PEN_INJECTOR | Freq: Three times a day (TID) | SUBCUTANEOUS | 3 refills | Status: DC
Start: 2023-03-11 — End: 2023-07-11

## 2023-03-11 MED ORDER — BUPROPION HCL ER (XL) 150 MG PO TB24: 150.0000 mg | ORAL_TABLET | Freq: Every day | ORAL | 1 refills | Status: DC

## 2023-03-11 NOTE — Assessment & Plan Note (Signed)
Chronic, Body mass index is 33.82 kg/m. Discussed importance of healthy weight management Discussed diet and exercise Request for assistance Discussed plan to switch ozempic to mounjaro and trial of wellbutrin and naltrexone

## 2023-03-11 NOTE — Progress Notes (Signed)
Established patient visit   Patient: Gabriel [name].   DOB: Apr 13, 1971   52 y.o. Male  MRN: [id number] Visit Date: 03/11/2023  Today's healthcare provider: Jacky Kindle, FNP  Introduced to nurse practitioner role and practice setting.  All questions answered.  Discussed provider/patient relationship and expectations.   Chief Complaint  Patient presents with   Hospitalization Follow-up    Patient reports concerns are improving but he still feels weak   Subjective    HPI HPI     Hospitalization Follow-up    Additional comments: Patient reports concerns are improving but he still feels weak      Last edited by Acey Lav, CMA on 03/11/2023  1:44 PM.      Follow up Hospitalization  Patient was admitted to [facility] on 7/19 and discharged on 7/21. He was treated for dizziness and L shoulder pain. Treatment for this included IVFs and reduced dose of insulin and medications held, Entresto etc. Telephone follow up was done on 7/22 He reports satisfactory compliance with treatment. He reports this condition is improved.  ----------------------------------------------------------------------------------------- -   Medications: Outpatient Medications Prior to Visit  Medication Sig   albuterol (VENTOLIN HFA) 108 (90 Base) MCG/ACT inhaler Inhale 2 puffs into the lungs every 4 (four) hours as needed for wheezing or shortness of breath.   allopurinol (ZYLOPRIM) 300 MG tablet Take 300 mg by mouth daily.   azelastine (ASTELIN) 0.1 % nasal spray Place 2 sprays into both nostrils 2 (two) times daily. Use in each nostril as directed   BD PEN NEEDLE NANO 2ND GEN 32G X 4 MM MISC USE  AS DIRECTED AT BEDTIME   blood glucose meter kit and supplies KIT Dispense based on patient and insurance preference. Use up to four times daily as directed.   carvedilol (COREG) 6.25 MG tablet Take 2 tablets (12.5 mg total) by mouth 2 (two) times daily. Increased from 6.25 mg 2 times daily.    cetirizine (ZYRTEC) 10 MG tablet Take 1 tablet (10 mg total) by mouth daily.   colchicine 0.6 MG tablet Day 1: Take 2 tablets PO for gout flare, repeat 1 tablet one hour following initial dose. Day 2: Take 1 tablet twice daily PO for an additional 2-3 days.   Continuous Blood Gluc Sensor (FREESTYLE LIBRE 3 SENSOR) MISC Place 1 sensor on the skin every 14 days. Use to check glucose continuously   cyclobenzaprine (FLEXERIL) 5 MG tablet Take 1 tablet (5 mg total) by mouth as needed.   dapagliflozin propanediol (FARXIGA) 10 MG TABS tablet Take 1 tablet (10 mg total) by mouth daily.   fenofibrate (TRICOR) 145 MG tablet Take 1 tablet (145 mg total) by mouth daily.   FLUoxetine (PROZAC) 20 MG capsule Take 1 capsule (20 mg total) by mouth daily.   fluticasone (FLONASE) 50 MCG/ACT nasal spray Place 2 sprays into both nostrils daily as needed. Home med.   icosapent Ethyl (VASCEPA) 1 g capsule Take 2 capsules (2 g total) by mouth 2 (two) times daily.   Insulin Pen Needle 32G X 4 MM MISC 1 each by Does not apply route in the morning, at noon, in the evening, and at bedtime.   meclizine (ANTIVERT) 25 MG tablet Take 1 tablet (25 mg total) by mouth 3 (three) times daily as needed for dizziness.   nicotine (NICODERM CQ - DOSED IN MG/24 HOURS) 21 mg/24hr patch Place 1 patch (21 mg total) onto the skin daily.   nitroGLYCERIN (NITROSTAT) 0.4  MG SL tablet Place 1 tablet (0.4 mg total) under the tongue every 5 (five) minutes x 3 doses as needed for chest pain.   omeprazole (PRILOSEC) 40 MG capsule Take 1 capsule (40 mg total) by mouth daily.   OZEMPIC, 2 MG/DOSE, 8 MG/3ML SOPN INJECT 1 DOSE (2MG ) SUBCUTANEOUSLY  ONCE A WEEK   potassium chloride SA (KLOR-CON M) 20 MEQ tablet Take 1 tablet (20 mEq total) by mouth every other day.   rivaroxaban (XARELTO) 20 MG TABS tablet Take 1 tablet (20 mg total) by mouth daily with supper.   rosuvastatin (CRESTOR) 40 MG tablet Take 1 tablet (40 mg total) by mouth daily.    sacubitril-valsartan (ENTRESTO) 97-103 MG Hold until followup with cardiology due to acute kidney injury.   spironolactone (ALDACTONE) 25 MG tablet Hold until followup with cardiology due to acute kidney injury.   torsemide (DEMADEX) 20 MG tablet Take 2 tablets (40 mg total) by mouth daily. Alternating days 60 mg and 40 mg.  Home med.   umeclidinium-vilanterol (ANORO ELLIPTA) 62.5-25 MCG/ACT AEPB Inhale 1 puff into the lungs daily.   [DISCONTINUED] insulin aspart (NOVOLOG) 100 UNIT/ML FlexPen Inject 10 Units into the skin 3 (three) times daily with meals. Inject 10 units + correction before breakfast/lunch and supper (correction = 1 unit per 50 mg/dL above 696); max daily dose 36 units   [DISCONTINUED] Insulin Glargine (BASAGLAR KWIKPEN) 100 UNIT/ML Inject 18 Units into the skin at bedtime. Reduced from 38 units.   No facility-administered medications prior to visit.    Review of Systems  Last CBC Lab Results  Component Value Date   WBC 7.7 03/09/2023   HGB 15.4 03/09/2023   HCT 45.9 03/09/2023   MCV 83.3 03/09/2023   MCH 27.9 03/09/2023   RDW 14.3 03/09/2023   PLT 158 03/09/2023   Last metabolic panel Lab Results  Component Value Date   GLUCOSE 159 (H) 03/09/2023   NA 136 03/09/2023   K 4.3 03/09/2023   CL 104 03/09/2023   CO2 25 03/09/2023   BUN 31 (H) 03/09/2023   CREATININE 1.56 (H) 03/09/2023   GFRNONAA 52 (L) 03/09/2023   CALCIUM 9.0 03/09/2023   PROT 6.6 03/08/2023   ALBUMIN 3.8 03/08/2023   LABGLOB 3.1 08/09/2022   AGRATIO 1.6 08/09/2022   BILITOT 1.4 (H) 03/08/2023   ALKPHOS 60 03/08/2023   AST 17 03/08/2023   ALT 28 03/08/2023   ANIONGAP 7 03/09/2023   Last lipids Lab Results  Component Value Date   CHOL 143 08/09/2022   HDL 24 (L) 08/09/2022   LDLCALC 62 08/09/2022   LDLDIRECT 60.6 11/23/2020   TRIG 367 (H) 08/09/2022   CHOLHDL 6.0 (H) 08/09/2022   Last hemoglobin A1c Lab Results  Component Value Date   HGBA1C 6.6 (H) 01/17/2023   Last thyroid  functions Lab Results  Component Value Date   TSH 1.508 03/08/2023   T4TOTAL 9.3 01/17/2023       Objective    BP 128/79   Pulse 93   Wt 249 lb 6.4 oz (113.1 kg)   SpO2 98%   BMI 33.82 kg/m  BP Readings from Last 3 Encounters:  03/11/23 128/79  03/09/23 (!) 143/89  03/02/23 (!) 176/96   Wt Readings from Last 3 Encounters:  03/11/23 249 lb 6.4 oz (113.1 kg)  03/08/23 250 lb 3.2 oz (113.5 kg)  03/02/23 250 lb (113.4 kg)   SpO2 Readings from Last 3 Encounters:  03/11/23 98%  03/09/23 96%  03/02/23 93%  Physical Exam Vitals and nursing note reviewed.  Constitutional:      Appearance: Normal appearance. He is obese.  HENT:     Head: Normocephalic and atraumatic.  Cardiovascular:     Rate and Rhythm: Normal rate and regular rhythm.     Pulses: Normal pulses.     Heart sounds: Normal heart sounds.  Pulmonary:     Effort: Pulmonary effort is normal.     Breath sounds: Normal breath sounds.     Comments: SOB noted/labored breathing with >2 sentences Abdominal:     General: Bowel sounds are normal.     Palpations: Abdomen is soft.  Musculoskeletal:        General: Normal range of motion.     Cervical back: Normal range of motion.  Skin:    General: Skin is warm and dry.     Capillary Refill: Capillary refill takes less than 2 seconds.  Neurological:     General: No focal deficit present.     Mental Status: He is alert and oriented to person, place, and time. Mental status is at baseline.  Psychiatric:        Mood and Affect: Mood normal.        Behavior: Behavior normal.        Thought Content: Thought content normal.        Judgment: Judgment normal.     No results found for any visits on 03/11/23.  Assessment & Plan     Problem List Items Addressed This Visit       Endocrine   Type 2 diabetes mellitus with hyperlipidemia (HCC) - Primary    Chronic; previously stable However, noted to have dehydration and labs were changed; request refills at this  time Too early to check A1c Continue to recommend balanced, lower carb meals. Smaller meal size, adding snacks. Choosing water as drink of choice and increasing purposeful exercise. Consult placed to [facility] PharmD to assist      Relevant Medications   insulin aspart (NOVOLOG) 100 UNIT/ML FlexPen   Insulin Glargine (BASAGLAR KWIKPEN) 100 UNIT/ML     Other   Class 1 obesity with alveolar hypoventilation, serious comorbidity, and body mass index (BMI) of 33.0 to 33.9 in adult [facility])    Chronic, Body mass index is 33.82 kg/m. Discussed importance of healthy weight management Discussed diet and exercise Request for assistance Discussed plan to switch ozempic to mounjaro and trial of wellbutrin and naltrexone       Relevant Medications   insulin aspart (NOVOLOG) 100 UNIT/ML FlexPen   Insulin Glargine (BASAGLAR KWIKPEN) 100 UNIT/ML   Hospital discharge follow-up    3 day admission for dehydration      Return in about 4 weeks (around 04/08/2023) for chonic disease management.     Leilani Merl, FNP, have reviewed all documentation for this visit. The documentation on 03/11/23 for the exam, diagnosis, procedures, and orders are all accurate and complete.  Jacky Kindle, FNP  [facility] [phone] (phone) [phone] (fax)  [facility]

## 2023-03-11 NOTE — Assessment & Plan Note (Signed)
3 day admission for dehydration

## 2023-03-11 NOTE — Assessment & Plan Note (Signed)
Chronic; previously stable However, noted to have dehydration and labs were changed; request refills at this time Too early to check A1c Continue to recommend balanced, lower carb meals. Smaller meal size, adding snacks. Choosing water as drink of choice and increasing purposeful exercise. Consult placed to [facility] PharmD to assist

## 2023-03-12 ENCOUNTER — Other Ambulatory Visit
Admission: RE | Admit: 2023-03-12 | Discharge: 2023-03-12 | Disposition: A | Discharge status: Home / Self Care | Payer: Medicaid Other | Source: Ambulatory Visit | Attending: Medical | Admitting: Medical

## 2023-03-12 ENCOUNTER — Other Ambulatory Visit: Payer: Self-pay

## 2023-03-12 ENCOUNTER — Ambulatory Visit: Payer: Medicaid Other | Attending: Medical | Admitting: Medical

## 2023-03-12 ENCOUNTER — Telehealth: Payer: Self-pay | Admitting: Student in an Organized Health Care Education/Training Program

## 2023-03-12 ENCOUNTER — Encounter: Payer: Self-pay | Admitting: Medical

## 2023-03-12 VITALS — BP 159/94 | HR 79 | Ht 71.0 in | Wt 250.6 lb

## 2023-03-12 DIAGNOSIS — R42 Dizziness and giddiness: Secondary | ICD-10-CM | POA: Diagnosis not present

## 2023-03-12 DIAGNOSIS — Z79899 Other long term (current) drug therapy: Secondary | ICD-10-CM

## 2023-03-12 DIAGNOSIS — I2510 Atherosclerotic heart disease of native coronary artery without angina pectoris: Secondary | ICD-10-CM

## 2023-03-12 DIAGNOSIS — Z95810 Presence of automatic (implantable) cardiac defibrillator: Secondary | ICD-10-CM | POA: Diagnosis not present

## 2023-03-12 DIAGNOSIS — I5022 Chronic systolic (congestive) heart failure: Secondary | ICD-10-CM | POA: Diagnosis not present

## 2023-03-12 LAB — BASIC METABOLIC PANEL
Anion gap: 9 (ref 5–15)
BUN: 24 mg/dL — ABNORMAL HIGH (ref 6–20)
CO2: 21 mmol/L — ABNORMAL LOW (ref 22–32)
Calcium: 9.1 mg/dL (ref 8.9–10.3)
Chloride: 108 mmol/L (ref 98–111)
Creatinine, Ser: 1.28 mg/dL — ABNORMAL HIGH (ref 0.61–1.24)
GFR, Estimated: >60 mL/min (ref >60)
Glucose, Bld: 187 mg/dL — ABNORMAL HIGH (ref 70–99)
Potassium: 3.8 mmol/L (ref 3.5–5.1)
Sodium: 138 mmol/L (ref 135–145)

## 2023-03-12 MED ORDER — TORSEMIDE 20 MG PO TABS: 40.0000 mg | ORAL_TABLET | Freq: Every day | ORAL | 3 refills | Status: DC

## 2023-03-12 NOTE — Telephone Encounter (Signed)
Pt states DME needs a RX to recalibrate/change pressure on CPAP

## 2023-03-12 NOTE — Telephone Encounter (Signed)
Lm for patient.  

## 2023-03-12 NOTE — Patient Instructions (Signed)
Medication Instructions:  Your physician recommends that you continue on your current medications as directed. Please refer to the Current Medication list given to you today.  *If you need a refill on your cardiac medications before your next appointment, please call your pharmacy*  Lab Work: Your physician recommends that you get lab work today: Engineer, civil (consulting) at [facility] 1st desk on the right to check in (REGISTRATION)  Lab hours: Monday- Friday (7:30 am- 5:30 pm)  If you have labs (blood work) drawn today and your tests are completely normal, you will receive your results only by: MyChart Message (if you have MyChart) OR A paper copy in the mail If you have any lab test that is abnormal or we need to change your treatment, we will call you to review the results.  Testing/Procedures: -None ordered  Follow-Up: At [facility], you and your health needs are our priority.  As part of our continuing mission to provide you with exceptional heart care, we have created designated Provider Care Teams.  These Care Teams include your primary Cardiologist (physician) and Advanced Practice Providers (APPs -  Physician Assistants and Nurse Practitioners) who all work together to provide you with the care you need, when you need it.  Your next appointment:   5 month(s)  Provider:   You may see Julien Nordmann, MD or one of the following Advanced Practice Providers on your designated Care Team:   Nicolasa Ducking, NP Eula Listen, PA-C Cadence Fransico Michael, PA-C Charlsie Quest, NP    Other Instructions -None

## 2023-03-12 NOTE — Progress Notes (Signed)
Cardiology Office Note:    Date:  03/12/2023   ID:  [name]., DOB 1970-10-15, MRN [id number]  PCP:  Jacky Kindle, FNP  [facility] Cardiologist:  Julien Nordmann, MD  [facility] Electrophysiologist:  Lanier Prude, MD   Referring MD: Jacky Kindle, FNP   Chief Complaint: Hospital follow-up  History of Present Illness:    Gabriel [name]. is a 52 y.o. 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  Patient was admitted in October 2021 with chest pain and elevated troponin.  Cath showed CTO of the RCA with left-to-right collaterals.  He otherwise had moderate, nonobstructive disease, including a 50% distal stenosis in the LAD, 30% stenosis in the left circumflex, and a 40% OM1 lesion.  He was medically managed.  Follow-up echo in October 2022 in the setting of recurrent hospitalization for heart failure showed EF of 40 to 45%.  The patient was readmitted in February 2023 in April 2023 with heart failure.  He responded well to IV Lasix and at his request, he was discharged home on torsemide 40 mg twice daily.  At an ER visit 01/17/2022 with elevated blood glucose levels.  Troponin was 59 K 2.7.  Creatinine was up from baseline.  He was treated with insulin, IV fluids and potassium.  Echo 02/06/2022 showed LVEF of 30 to 35%.  The patient underwent ICD placement on May 20, 2022.  Echo in December 2023 showed LVEF of 40 to 45%.    Patient was admitted to [facility] 03/07/2023 with dizziness found to be hypotensive and dehydrated.  All his cardiac medications were held.  Blood pressure and kidney function improved and cardiac medications were slowly added back on.  Today, the patient reports mild weakness and persistent dizziness. BP today is mildly  elevated. He is tolerating all his cardiac medications. He has stable chest pain and shortness of breath. He has not been checking BP at home. He is going to ENT and will have MRI of the head.  Past Medical History:  Diagnosis Date   Acute on chronic combined systolic and diastolic CHF (congestive heart failure) (HCC) 09/02/2017   CAD (coronary artery disease)    a. 04/2015 low risk MV;  b. 12/2016 Cath: minor irregs in LAD/Diag/LCX/OM, RCA 40p/m/d; c. 05/2020 Cath: LM nl, LAD 50d, LCX 30p, OM1 40, RCA 100p w/ L->R collats. CO/CI 3.1/1.3-->Med rx.   Chest wall pain, chronic    Chronic Troponin Elevation    CKD (chronic kidney disease), stage II-III    COPD (chronic obstructive pulmonary disease) (HCC)    Diabetes mellitus without complication (HCC)    HFrEF (heart failure with reduced ejection fraction) (HCC)    a. 03/2015 Echo: EF 45-50%; b. 12/2015 Echo: EF 20-25%; c. 02/2016 Echo: EF 30-35%; d. 11/2016 Echo: EF 40-45%; e. 06/2019 Echo: EF 30-35%; f. 11/2019 Echo: EF 25-30%; g. 05/2020 Echo: EF 35-40%; h. 05/2021 Echo: EF 40-45%; i. 11/2021 Echo: EF 20-25%, glob HK, mild LVH, GrIII DD, sev red RV fxn, mild MR.   Hypertension    Mitral regurgitation    Mild to moderate by October 2021 echocardiogram.   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 03/2015 Echo: EF 45-50%; b. 12/2015 Echo: EF 20-25%; c. 02/2016 Echo: EF 30-35%; d.  11/2016 Echo: EF 40-45%; e. 06/2019 Echo: EF 30-35%; f. 11/2019 Echo: EF 25-30%; g. 05/2020 Echo: EF 35-40%; h. 05/2021 Echo: EF 40-45%; i. 11/2021 Echo: EF 20-25%   Myocardial infarct (HCC)    NSVT (nonsustained ventricular tachycardia) (HCC)    a. 12/2015 noted on tele-->amio;  b. 12/2015 Event monitor: no VT noted.   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  12/31/19: RUL and RLL PEs.   Syncope    a. 01/2016 - felt to be vasovagal.    Past Surgical History:  Procedure Laterality  Date   AMPUTATION     CARDIAC CATHETERIZATION     COLONOSCOPY WITH PROPOFOL N/A 08/01/2022   Procedure: COLONOSCOPY WITH PROPOFOL;  Surgeon: Toney Reil, MD;  Location: [facility] ENDOSCOPY;  Service: Gastroenterology;  Laterality: N/A;   ESOPHAGOGASTRODUODENOSCOPY (EGD) WITH PROPOFOL N/A 08/01/2022   Procedure: ESOPHAGOGASTRODUODENOSCOPY (EGD) WITH PROPOFOL;  Surgeon: Toney Reil, MD;  Location: [facility] ENDOSCOPY;  Service: Gastroenterology;  Laterality: N/A;   ESOPHAGOGASTRODUODENOSCOPY (EGD) WITH PROPOFOL N/A 10/16/2022   Procedure: ESOPHAGOGASTRODUODENOSCOPY (EGD) WITH PROPOFOL;  Surgeon: Toney Reil, MD;  Location: [facility] ENDOSCOPY;  Service: Gastroenterology;  Laterality: N/A;   FINGER AMPUTATION     Traumatic   FINGER FRACTURE SURGERY Left    FRACTURE SURGERY     LEFT HEART CATH AND CORONARY ANGIOGRAPHY N/A 01/06/2017   Procedure: Left Heart Cath and Coronary Angiography;  Surgeon: Iran Ouch, MD;  Location: [facility] INVASIVE CV LAB;  Service: Cardiovascular;  Laterality: N/A;   RIGHT/LEFT HEART CATH AND CORONARY ANGIOGRAPHY N/A 06/13/2020   Procedure: RIGHT/LEFT HEART CATH AND CORONARY ANGIOGRAPHY;  Surgeon: Yvonne Kendall, MD;  Location: [facility] INVASIVE CV LAB;  Service: Cardiovascular;  Laterality: N/A;   SUBQ ICD IMPLANT N/A 05/20/2022   Procedure: SUBQ ICD IMPLANT;  Surgeon: Lanier Prude, MD;  Location: [facility] INVASIVE CV LAB;  Service: Cardiovascular;  Laterality: N/A;    Current Medications: Current Meds  Medication Sig   albuterol (VENTOLIN HFA) 108 (90 Base) MCG/ACT inhaler Inhale 2 puffs into the lungs every 4 (four) hours as needed for wheezing or shortness of breath.   allopurinol (ZYLOPRIM) 300 MG tablet Take 300 mg by mouth daily.   azelastine (ASTELIN) 0.1 % nasal spray Place 2 sprays into both nostrils 2 (two) times daily. Use in each nostril as directed   BD PEN NEEDLE NANO 2ND GEN 32G X 4 MM MISC USE  AS DIRECTED AT BEDTIME   blood glucose meter  kit and supplies KIT Dispense based on patient and insurance preference. Use up to four times daily as directed.   buPROPion (WELLBUTRIN XL) 150 MG 24 hr tablet Take 1 tablet (150 mg total) by mouth daily.   carvedilol (COREG) 6.25 MG tablet Take 2 tablets (12.5 mg total) by mouth 2 (two) times daily. Increased from 6.25 mg 2 times daily.   cetirizine (ZYRTEC) 10 MG tablet Take 1 tablet (10 mg total) by mouth daily.   colchicine 0.6 MG tablet Day 1: Take 2 tablets PO for gout flare, repeat 1 tablet one hour following initial dose. Day 2: Take 1 tablet twice daily PO for an additional 2-3 days.   Continuous Blood Gluc Sensor (FREESTYLE LIBRE 3 SENSOR) MISC Place 1 sensor on the skin every 14 days. Use to check glucose continuously   cyclobenzaprine (FLEXERIL) 5 MG tablet Take 1 tablet (5 mg total) by mouth as needed.   dapagliflozin  propanediol (FARXIGA) 10 MG TABS tablet Take 1 tablet (10 mg total) by mouth daily.   fenofibrate (TRICOR) 145 MG tablet Take 1 tablet (145 mg total) by mouth daily.   FLUoxetine (PROZAC) 20 MG capsule Take 1 capsule (20 mg total) by mouth daily.   fluticasone (FLONASE) 50 MCG/ACT nasal spray Place 2 sprays into both nostrils daily as needed. Home med.   icosapent Ethyl (VASCEPA) 1 g capsule Take 2 capsules (2 g total) by mouth 2 (two) times daily.   insulin aspart (NOVOLOG) 100 UNIT/ML FlexPen Inject 10 Units into the skin 3 (three) times daily with meals. Inject 10 units + correction before breakfast/lunch and supper (correction = 1 unit per 50 mg/dL above 657); max daily dose 36 units   Insulin Glargine (BASAGLAR KWIKPEN) 100 UNIT/ML Inject 18 Units into the skin at bedtime. Reduced from 38 units.   Insulin Pen Needle 32G X 4 MM MISC 1 each by Does not apply route in the morning, at noon, in the evening, and at bedtime.   meclizine (ANTIVERT) 25 MG tablet Take 1 tablet (25 mg total) by mouth 3 (three) times daily as needed for dizziness.   naltrexone (DEPADE) 50 MG  tablet Take 1 tablet (50 mg total) by mouth daily.   nitroGLYCERIN (NITROSTAT) 0.4 MG SL tablet Place 1 tablet (0.4 mg total) under the tongue every 5 (five) minutes x 3 doses as needed for chest pain.   omeprazole (PRILOSEC) 40 MG capsule Take 1 capsule (40 mg total) by mouth daily.   OZEMPIC, 2 MG/DOSE, 8 MG/3ML SOPN INJECT 1 DOSE (2MG ) SUBCUTANEOUSLY  ONCE A WEEK   potassium chloride SA (KLOR-CON M) 20 MEQ tablet Take 1 tablet (20 mEq total) by mouth every other day.   rivaroxaban (XARELTO) 20 MG TABS tablet Take 1 tablet (20 mg total) by mouth daily with supper.   rosuvastatin (CRESTOR) 40 MG tablet Take 1 tablet (40 mg total) by mouth daily.   sacubitril-valsartan (ENTRESTO) 97-103 MG Hold until followup with cardiology due to acute kidney injury.   torsemide (DEMADEX) 20 MG tablet Take 2 tablets (40 mg total) by mouth daily. Alternating days 60 mg and 40 mg.  Home med.   umeclidinium-vilanterol (ANORO ELLIPTA) 62.5-25 MCG/ACT AEPB Inhale 1 puff into the lungs daily.     Allergies:   Metformin and related and Zantac [ranitidine hcl]   Social History   Socioeconomic History   Marital status: Widowed    Spouse name: Not on file   Number of children: 1   Years of education: 61   Highest education level: 12th grade  Occupational History   Occupation: disabled  Tobacco Use   Smoking status: Former    Current packs/day: 0.00    Average packs/day: 0.5 packs/day for 33.0 years (16.5 ttl pk-yrs)    Types: Cigarettes    Start date: 05/09/1987    Quit date: 05/08/2020    Years since quitting: 2.8   Smokeless tobacco: Former    Quit date: 05/08/2020   Tobacco comments:    Quit Sept 2021  Vaping Use   Vaping status: Former  Substance and Sexual Activity   Alcohol use: Not Currently    Comment: occassionally   Drug use: No   Sexual activity: Not Currently  Other Topics Concern   Not on file  Social History Narrative   Lives at home with wife and his dad's wife.        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    Social Determinants of Health   Financial Resource Strain: Medium Risk (08/05/2022)   Overall Financial Resource Strain (CARDIA)    Difficulty of Paying Living Expenses: Somewhat hard  Food Insecurity: No Food Insecurity (03/08/2023)   Hunger Vital Sign    Worried About Running Out of Food in the Last Year: Never true    Ran Out of Food in the Last Year: Never true  Transportation Needs: No Transportation Needs (03/08/2023)   PRAPARE - Administrator, Civil Service (Medical): No    Lack of Transportation (Non-Medical): No  Physical Activity: Insufficiently Active (01/20/2018)   Exercise Vital Sign    Days of Exercise per Week: 7 days    Minutes of Exercise per Session: 10 min  Stress: Stress Concern Present (09/02/2017)   [name] of Occupational Health - Occupational Stress Questionnaire    Feeling of Stress : Very much  Social Connections: Moderately Isolated (09/02/2017)   Social Connection and Isolation Panel [NHANES]    Frequency of Communication with Friends and Family: Twice a week    Frequency of Social Gatherings with Friends and Family: Once a week    Attends Religious Services: Never    Database administrator or Organizations: No    Attends Banker Meetings: Never    Marital Status: Widowed     Family History: The patient's family history includes Cancer in his maternal grandfather and paternal aunt; Diabetes in his maternal grandfather and mother; Diabetes Mellitus II in his mother; Gout in his father; Heart attack in his mother; Hypertension in his father and mother; Hypothyroidism in his mother; Kidney failure in his mother.  ROS:   Please see the history of present illness.     All other systems reviewed and are negative.  EKGs/Labs/Other Studies Reviewed:    The following studies were reviewed today:  Echo 02/2023 1. Left  ventricular ejection fraction, by estimation, is 45 to 50%. The  left ventricle has mildly decreased function. The left ventricle  demonstrates global hypokinesis. The left ventricular internal cavity size  was mildly to moderately dilated. There  is moderate concentric left ventricular hypertrophy. Left ventricular  diastolic function could not be evaluated.   2. Right ventricular systolic function is normal. The right ventricular  size is normal. Tricuspid regurgitation signal is inadequate for assessing  PA pressure.   3. The mitral valve is degenerative. Trivial mitral valve regurgitation.  No evidence of mitral stenosis.   4. The aortic valve is calcified. Aortic valve regurgitation is mild.  Aortic valve sclerosis/calcification is present, without any evidence of  aortic stenosis.   5. Aortic dilatation noted. There is mild dilatation of the aortic root,  measuring 43 mm.   Echo 07/2022 1. Left ventricular ejection fraction, by estimation, is 40 to 45%. The  left ventricle has mildly decreased function. The left ventricle  demonstrates regional wall motion abnormalities (see scoring  diagram/findings for description). The left ventricular   internal cavity size was mildly dilated. There is mild left ventricular  hypertrophy. Left ventricular diastolic parameters are consistent with  Grade I diastolic dysfunction (impaired relaxation). There is severe  hypokinesis of the left ventricular,  entire inferior wall and inferolateral wall.   2. Right ventricular systolic function is normal. The right ventricular  size is normal. Tricuspid regurgitation signal is inadequate for assessing  PA pressure.   3. Left atrial size was mildly dilated.  4. The mitral valve is normal in structure. No evidence of mitral valve  regurgitation. No evidence of mitral stenosis.   5. The aortic valve is normal in structure. Aortic valve regurgitation is  mild. Aortic valve sclerosis/calcification is  present, without any  evidence of aortic stenosis.   6. Aortic dilatation noted. There is mild dilatation of the aortic root,  measuring 40 mm.   Comparison(s): A prior study was performed on 11/24/2021. Changes from prior  study are noted. The left ventricular function has improved.    Limited echo 01/2022  1. Left ventricular ejection fraction, by estimation, is 30 to 35%. The  left ventricle has moderate to severely decreased function. Left  ventricular diastolic parameters are consistent with Grade II diastolic  dysfunction (pseudonormalization).   2. Aortic valve regurgitation is mild to moderate.    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  The distal vessel fills via left to right collaterals. Mild to moderate, nonobstructive CAD involving the LAD and LCx with up to 50% stenosis. Mildly elevated left and right heart filling pressures. Moderate pulmonary hypertension (mean PAP 40 mmHg) with significantly elevated pulmonary vascular resistance (PVR 7.4 WU). Severely reduced Fick cardiac output/index.   Recommendations: Continue IV diuresis for at least 1 more day. Given severely reduced cardiac output, I will decrease carvedilol to 6.5 mg twice daily.  If blood pressure tolerates and renal function is stable, consider further escalation of Entresto. Restart IV heparin 2 hours after TR band removal.  If no evidence of bleeding or vascular complication, the patient can be transitioned back to [facility] tomorrow. 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  He was evaluated by Dr. Shirlee Latch in June but failed to follow-up since then. Aggressive secondary prevention of coronary artery disease.  Favor medical management of CTO of RCA, which is new since 2018.   Yvonne Kendall, MD [facility]   Echo 05/2020  1. Left ventricular ejection fraction, by estimation, is 35 to 40%. The  left ventricle has moderately decreased function. The left ventricle  demonstrates global hypokinesis. The left ventricular internal cavity size  was moderately dilated. Left  ventricular diastolic parameters are indeterminate.   2. Right ventricular systolic function is normal. The right ventricular  size is normal. There is mildly elevated pulmonary artery systolic  pressure. The estimated right ventricular systolic pressure is 44.7 mmHg.   3. Left atrial size was moderately dilated.   4. Mild to moderate mitral valve regurgitation.   EKG:  EKG is ordered today.  The ekg ordered today demonstrates NSR, PVCs, 79bpm,   Recent Labs: 03/07/2023: B Natriuretic Peptide 104.6 03/08/2023: ALT 28; TSH 1.508 03/09/2023: BUN 31; Creatinine, Ser 1.56; Hemoglobin 15.4; Magnesium 2.1; Platelets 158; Potassium 4.3; Sodium 136  Recent Lipid Panel    Component Value Date/Time   CHOL 143 08/09/2022 0922   TRIG 367 (H) 08/09/2022 0922   HDL 24 (L) 08/09/2022 0922   CHOLHDL 6.0 (H) 08/09/2022 0922   CHOLHDL 6.5 11/25/2021 0620   VLDL 31 11/25/2021 0620   LDLCALC 62 08/09/2022 0922   LDLDIRECT 60.6 11/23/2020 2101    Physical Exam:    VS:  BP (!) 159/94 (BP Location: Left Arm, Patient Position: Sitting, Cuff Size: Normal)   Pulse 79   Ht 5\' 11"  (1.803 m)   Wt 250 lb 9.6 oz (  113.7 kg)   SpO2 93%   BMI 34.95 kg/m     Wt Readings from Last 3 Encounters:  03/12/23 250 lb 9.6 oz (113.7 kg)  03/11/23 249 lb 6.4 oz (113.1 kg)  03/08/23 250 lb 3.2 oz (113.5 kg)     GEN:  Well nourished, well developed in no acute distress HEENT: Normal NECK: No JVD; No carotid bruits LYMPHATICS: No lymphadenopathy CARDIAC: RRR, no murmurs, rubs, gallops RESPIRATORY:  Clear to auscultation without rales, wheezing or rhonchi  ABDOMEN: Soft, non-tender, non-distended MUSCULOSKELETAL:  No edema; No  deformity  SKIN: Warm and dry NEUROLOGIC:  Alert and oriented x 3 PSYCHIATRIC:  Normal affect   ASSESSMENT:    1. Dizziness   2. Chronic systolic heart failure (HCC)   3. Implantable cardioverter-defibrillator (ICD) in situ   4. CAD in native artery    PLAN:    In order of problems listed above:  Dizziness/hypotension/AKI Chronic HFrEF Mixed ICM/NICM S/p ICD Recent hospitalization for dizziness found to be hypotensive with AKI.  All of his cardiac medications were stopped.  His blood pressure and kidney function improved and his cardiac meds were slowly added on. Echo showed stable pump function of 45-50%. He is currently taking Coreg 6.25 mg twice daily, Farxiga 10 mg daily, Entresto 97-103 mg twice daily, torsemide alternating 40/60 mg daily.  Blood pressure today 159/94.  Most recent lab work showed serum creatinine 1.56.  Patient is euvolemic on exam.  He is overall feeling better, but however still weak with some dizziness.  He plans on seeing ENT and will have a MRI of the head.  I will check a BMET today.  If this is normal, we will add back spironolactone 25 mg daily.  Patient has follow-up with heart failure clinic in the next few months.  I recommend he check his blood pressure at home.  CAD with chronic chest pain and elevated troponin Patient reports chronic unchanged chest pain.  Left heart cath in 2021 showed CTO of the proximal RCA with left-to-right collaterals, mild to moderate nonobstructive CAD.  Recent echo showed table pump function.  Continue medical management with fenofibrate, Vascepa, and Crestor.  No aspirin given Xarelto.  H/o pulmonary embolism Continue long-term Xarelto  CKD stage 3 BMET as above.   Disposition: Follow up in 5 month(s) with MD/APP    Signed, Raven Furnas David Stall, PA-C  03/12/2023 9:33 AM    [facility]

## 2023-03-13 ENCOUNTER — Ambulatory Visit (INDEPENDENT_AMBULATORY_CARE_PROVIDER_SITE_OTHER): Payer: Medicaid Other | Admitting: Orthopedic Surgery

## 2023-03-13 ENCOUNTER — Ambulatory Visit
Admission: RE | Admit: 2023-03-13 | Discharge: 2023-03-13 | Disposition: A | Discharge status: Home / Self Care | Payer: Medicaid Other | Attending: Orthopedic Surgery | Admitting: Orthopedic Surgery

## 2023-03-13 ENCOUNTER — Encounter: Payer: Self-pay | Admitting: Orthopedic Surgery

## 2023-03-13 ENCOUNTER — Ambulatory Visit
Admission: RE | Admit: 2023-03-13 | Discharge: 2023-03-13 | Disposition: A | Discharge status: Home / Self Care | Payer: Medicaid Other | Source: Ambulatory Visit | Attending: Orthopedic Surgery | Admitting: Orthopedic Surgery

## 2023-03-13 VITALS — BP 142/90 | Wt 250.0 lb

## 2023-03-13 DIAGNOSIS — M47812 Spondylosis without myelopathy or radiculopathy, cervical region: Secondary | ICD-10-CM | POA: Diagnosis not present

## 2023-03-13 DIAGNOSIS — M542 Cervicalgia: Secondary | ICD-10-CM

## 2023-03-13 DIAGNOSIS — M25512 Pain in left shoulder: Secondary | ICD-10-CM | POA: Insufficient documentation

## 2023-03-13 DIAGNOSIS — M25519 Pain in unspecified shoulder: Secondary | ICD-10-CM | POA: Diagnosis not present

## 2023-03-13 NOTE — Telephone Encounter (Signed)
I spoke with the patient. He said when he went to [employer] for his mask fitting they looked at his compliance report and said his pressures need to be changed. He said he feels it is too low. I have copied his compliance download in the message.

## 2023-03-13 NOTE — Patient Instructions (Signed)
It was so nice to see you today. Thank you so much for coming in.    I think the pain in your left shoulder is from your shoulder.   The pain in the back of your neck is likely from your neck.   I ordered xrays of your neck. You can get these at [facility] (building with the white pillars) off of [address]. The address is [address], [place], [place] [address]. You do not need any appointment. I will message you with results.   Follow up with ortho as scheduled for the shoulder.   I'm not sure why ENT wanted [place] to see you. I will message you in 4-5 weeks to check on you.   Please do not hesitate to call if you have any questions or concerns. You can also message me in MyChart.   Your blood pressure was elevated today.  I want you to recheck it at home and follow up with your PCP if it remains high. If you have any chest pain, shortness of breath, blurry vision, or headaches then you need to go to ED.    Drake Leach PA-C [phone]

## 2023-03-14 ENCOUNTER — Encounter: Payer: Self-pay | Admitting: Pharmacist

## 2023-03-14 NOTE — Telephone Encounter (Signed)
This encounter was created in error - please disregard.

## 2023-03-14 NOTE — Progress Notes (Signed)
Remote ICD transmission.   

## 2023-03-17 NOTE — Telephone Encounter (Signed)
I have notified the patient. Nothing further needed. 

## 2023-03-18 ENCOUNTER — Other Ambulatory Visit: Payer: Self-pay | Admitting: Family Medicine

## 2023-03-18 MED ORDER — TIRZEPATIDE 5 MG/0.5ML ~~LOC~~ SOAJ: 5.0000 mg | SUBCUTANEOUS | 0 refills | Status: DC

## 2023-03-20 ENCOUNTER — Other Ambulatory Visit: Payer: Self-pay | Admitting: Gastroenterology

## 2023-03-20 NOTE — Telephone Encounter (Signed)
Last office visit 09/30/2022 Plan: Continue omeprazole 40 mg twice daily Recommend repeat EGD to confirm healing of gastric ulcers Patient had right upper quadrant ultrasound in 4/23 which was normal, HIDA scan was negative CT abdomen pelvis with contrast in 03/2021 revealed normal-appearing pancreas  EGD 10/16/2022  Gabriel [name]   The pathology results from EGD came back normal, will follow up on the gastric emptying study results   Gastri emptying Please inform patient of the gastric emptying study results which showed delayed gastric emptying and explains his symptoms very well.   Has appointment 04/14/2023

## 2023-03-21 ENCOUNTER — Encounter: Payer: Self-pay | Admitting: Orthopedic Surgery

## 2023-03-21 IMAGING — CR DG CHEST 2V
2 series · 2 of 2 positions shown · non-contrast
Comparison: 08/09/2021.

CLINICAL DATA: chest pain, SHOB

EXAM:
CHEST - 2 VIEW

[chest pa]
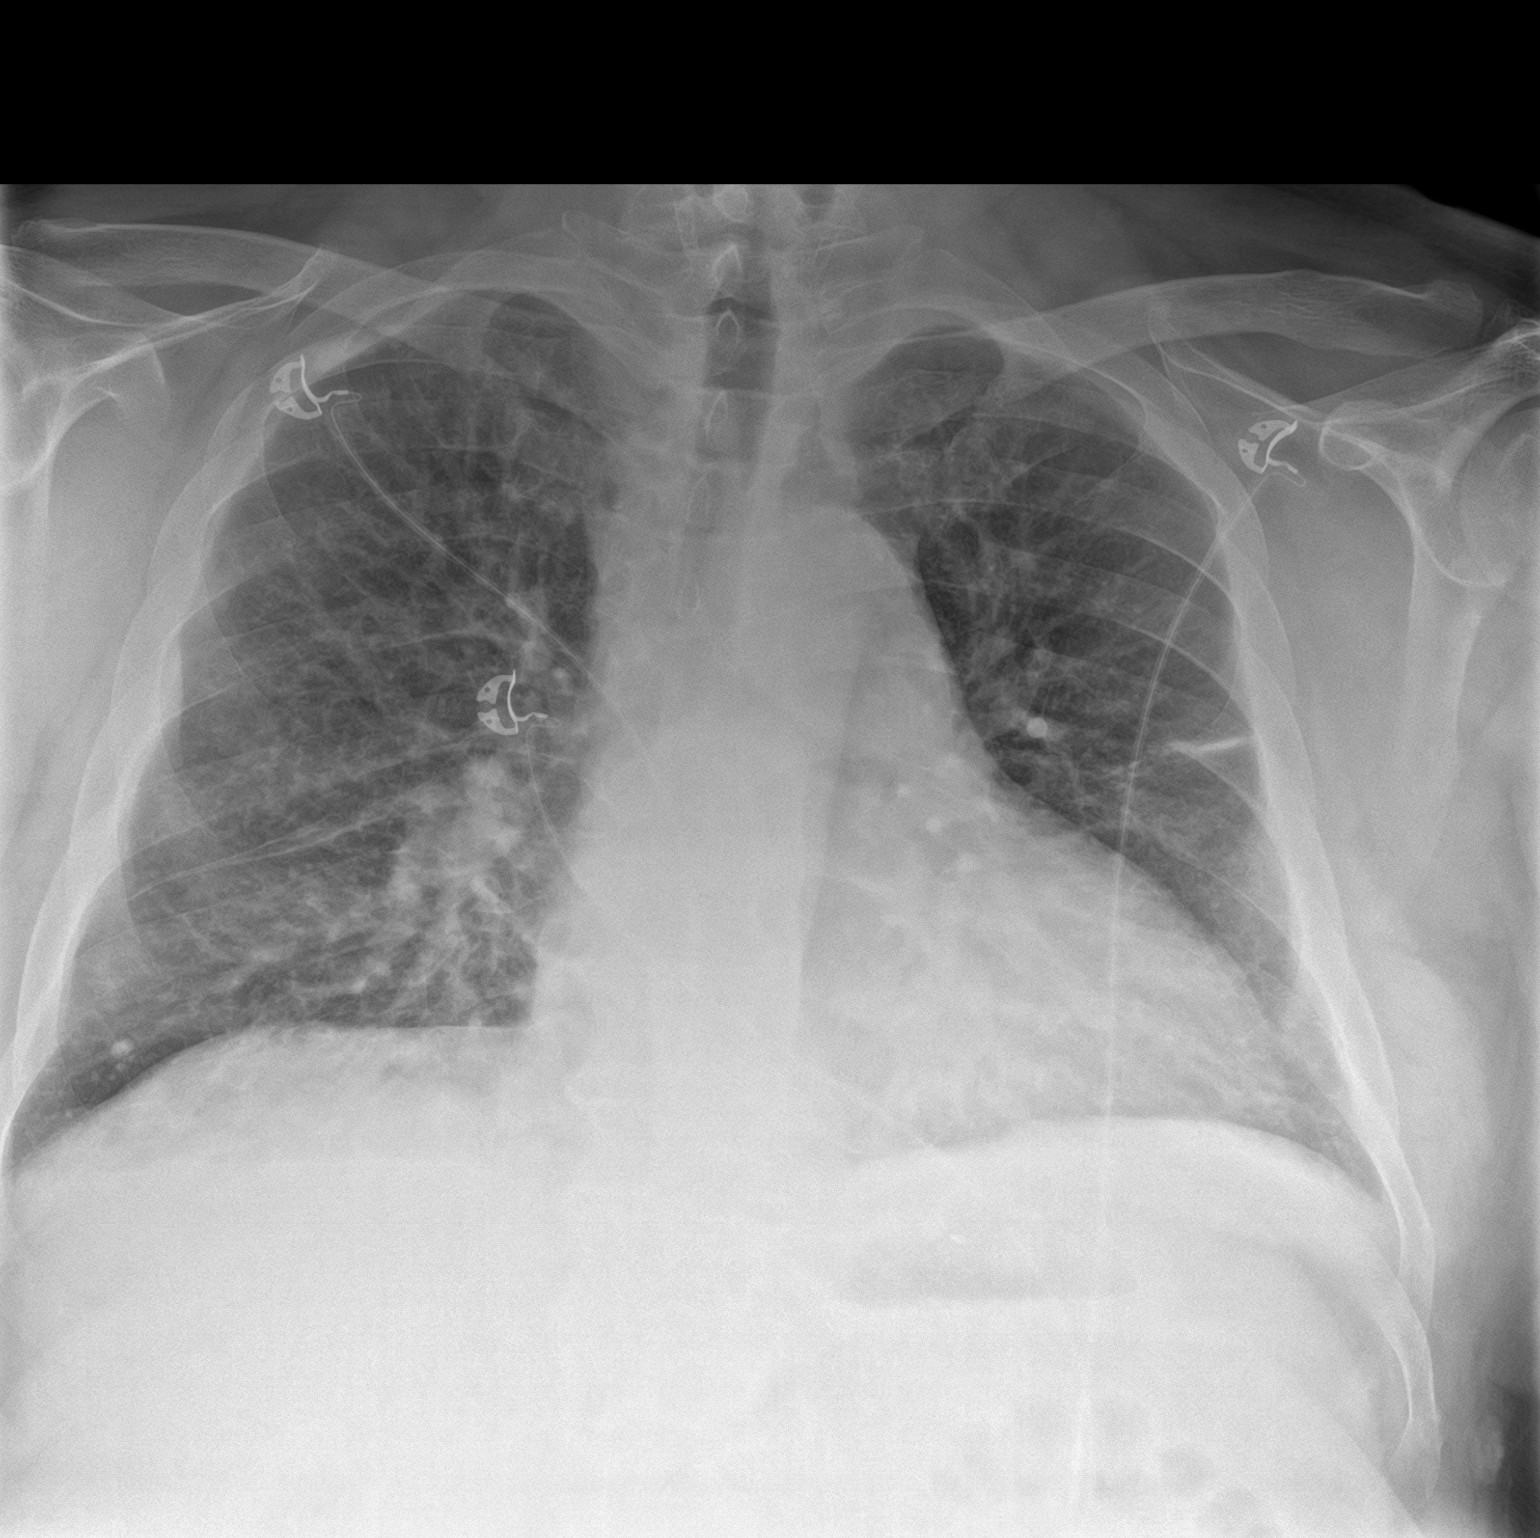

[chest lat]
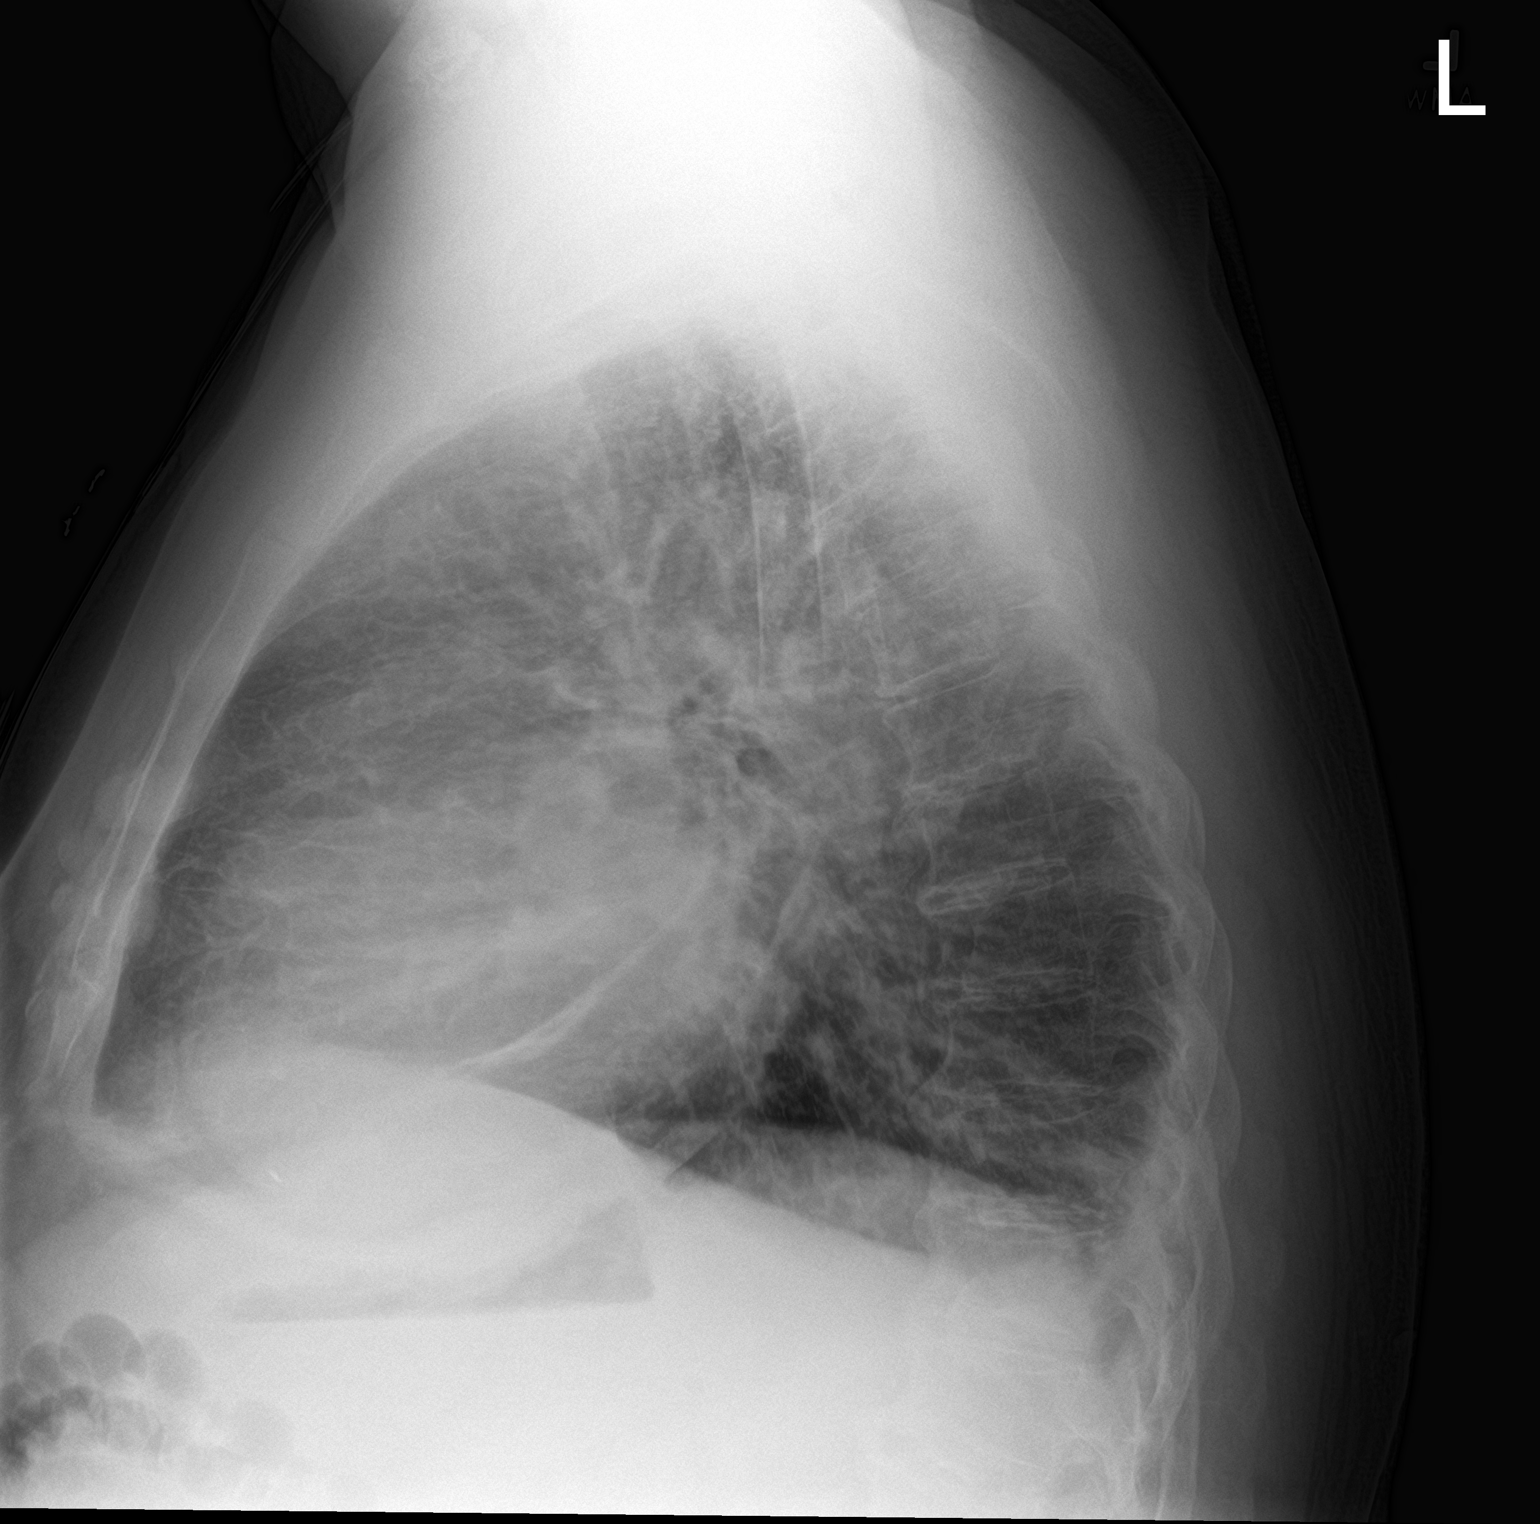

[2 of 2 positions shown; findings below may reference images not displayed]

FINDINGS: Linear opacity left midlung, compatible with subsegmental
atelectasis. No confluent consolidation. No visible pleural
effusions or pneumothorax. Similar cardiomediastinal silhouette.
IMPRESSION: Left midlung subsegmental atelectasis. Otherwise, low lung volumes
without evidence of acute cardiopulmonary disease.

## 2023-03-21 NOTE — Telephone Encounter (Signed)
Cervical xrays dated 03/13/23:  FINDINGS: No fracture, bone lesion or spondylolisthesis.   Minor loss of disc height with small anterior endplate osteophytes at C4-C5, C5-C6 and C6-C7, stable. Remaining disc spaces are well preserved.   Soft tissues are unremarkable.   IMPRESSION: 1. No fracture or acute finding.  No malalignment. 2. Minor disc degenerative changes at C4-C5, C5-C6 and C6-C7.     Electronically Signed   By: Amie Portland M.D.   On: 03/20/2023 13:08  I have personally reviewed the images and agree with the above interpretation.  Message sent to patient. Recommend he do PT for cervical spine.

## 2023-03-24 DIAGNOSIS — R42 Dizziness and giddiness: Secondary | ICD-10-CM | POA: Diagnosis not present

## 2023-03-25 DIAGNOSIS — R42 Dizziness and giddiness: Secondary | ICD-10-CM | POA: Diagnosis not present

## 2023-03-25 DIAGNOSIS — R519 Headache, unspecified: Secondary | ICD-10-CM | POA: Diagnosis not present

## 2023-03-28 ENCOUNTER — Other Ambulatory Visit: Payer: Self-pay | Admitting: Student

## 2023-03-28 DIAGNOSIS — R42 Dizziness and giddiness: Secondary | ICD-10-CM

## 2023-04-08 ENCOUNTER — Telehealth: Payer: Self-pay | Admitting: Cardiology

## 2023-04-08 NOTE — Telephone Encounter (Signed)
Spoke with Judeth Cornfield, she stated she would send order to [facility] and [facility] to schedule MRI

## 2023-04-08 NOTE — Telephone Encounter (Signed)
LVM with [facility] informing them that patient could have MRI but it would need to be done at hospital facility. Direct number to device clinic given.

## 2023-04-08 NOTE — Telephone Encounter (Signed)
Patient came by office States that he is suppose to have an MRI through ENT but they told him he could not due to his defibrillator States that he read that they do MRIs with defibrillators and would like to discuss with nurse Please call to discuss

## 2023-04-14 ENCOUNTER — Telehealth: Payer: Self-pay

## 2023-04-14 ENCOUNTER — Other Ambulatory Visit
Admission: RE | Admit: 2023-04-14 | Discharge: 2023-04-14 | Disposition: A | Discharge status: Home / Self Care | Payer: Medicaid Other | Source: Ambulatory Visit | Attending: Family | Admitting: Family

## 2023-04-14 ENCOUNTER — Ambulatory Visit (INDEPENDENT_AMBULATORY_CARE_PROVIDER_SITE_OTHER): Payer: Medicaid Other | Admitting: Gastroenterology

## 2023-04-14 ENCOUNTER — Other Ambulatory Visit (HOSPITAL_COMMUNITY): Payer: Self-pay | Admitting: Student

## 2023-04-14 ENCOUNTER — Other Ambulatory Visit: Payer: Self-pay

## 2023-04-14 ENCOUNTER — Encounter: Payer: Self-pay | Admitting: Gastroenterology

## 2023-04-14 VITALS — BP 108/71 | HR 85 | Temp 98.1°F | Ht 71.0 in | Wt 246.5 lb

## 2023-04-14 DIAGNOSIS — K7581 Nonalcoholic steatohepatitis (NASH): Secondary | ICD-10-CM | POA: Diagnosis not present

## 2023-04-14 DIAGNOSIS — K529 Noninfective gastroenteritis and colitis, unspecified: Secondary | ICD-10-CM | POA: Diagnosis not present

## 2023-04-14 DIAGNOSIS — K7689 Other specified diseases of liver: Secondary | ICD-10-CM | POA: Diagnosis not present

## 2023-04-14 DIAGNOSIS — I5022 Chronic systolic (congestive) heart failure: Secondary | ICD-10-CM

## 2023-04-14 DIAGNOSIS — K7460 Unspecified cirrhosis of liver: Secondary | ICD-10-CM

## 2023-04-14 DIAGNOSIS — K3184 Gastroparesis: Secondary | ICD-10-CM

## 2023-04-14 DIAGNOSIS — K259 Gastric ulcer, unspecified as acute or chronic, without hemorrhage or perforation: Secondary | ICD-10-CM | POA: Diagnosis not present

## 2023-04-14 DIAGNOSIS — R42 Dizziness and giddiness: Secondary | ICD-10-CM | POA: Diagnosis not present

## 2023-04-14 DIAGNOSIS — Z8711 Personal history of peptic ulcer disease: Secondary | ICD-10-CM | POA: Diagnosis not present

## 2023-04-14 DIAGNOSIS — K7469 Other cirrhosis of liver: Secondary | ICD-10-CM | POA: Diagnosis not present

## 2023-04-14 LAB — MAGNESIUM: Magnesium: 2.4 mg/dL (ref 1.7–2.4)

## 2023-04-14 LAB — BASIC METABOLIC PANEL
Anion gap: 12 (ref 5–15)
BUN: 33 mg/dL — ABNORMAL HIGH (ref 6–20)
CO2: 28 mmol/L (ref 22–32)
Calcium: 10 mg/dL (ref 8.9–10.3)
Chloride: 99 mmol/L (ref 98–111)
Creatinine, Ser: 1.83 mg/dL — ABNORMAL HIGH (ref 0.61–1.24)
GFR, Estimated: 43 mL/min — ABNORMAL LOW (ref >60)
Glucose, Bld: 199 mg/dL — ABNORMAL HIGH (ref 70–99)
Potassium: 4.7 mmol/L (ref 3.5–5.1)
Sodium: 139 mmol/L (ref 135–145)

## 2023-04-14 MED ORDER — TORSEMIDE 20 MG PO TABS: 40.0000 mg | ORAL_TABLET | Freq: Every day | ORAL | 3 refills | Status: DC

## 2023-04-14 MED ORDER — METOCLOPRAMIDE HCL 5 MG PO TABS: 5.0000 mg | ORAL_TABLET | Freq: Three times a day (TID) | ORAL | 0 refills | Status: DC | PRN

## 2023-04-14 NOTE — Telephone Encounter (Addendum)
     Electa Sniff, RN      Spoke with pt regarding Gabriel Kindred, FNP recommendations. Take Torsemide 40 mg (2 tablets) daily. Pt aware, agreeable, and verbalized understanding Pt will come next week for lab work.   Delma Freeze, FNP      Potassium, sodium and magnesium levels are normal so cramping would not be related to this. Please follow up with PCP about the cramping. Kidney function is a little worse which could be due to the diarrhea that you're having. If weight / BP is stable, decrease torsemide to 40mg  daily (currently alternating 40/ 60mg  doses). Recheck BMP next week

## 2023-04-14 NOTE — Addendum Note (Signed)
Addended by: Merita Norton T on: 04/14/2023 07:13 AM   Modules accepted: Level of Service

## 2023-04-14 NOTE — Telephone Encounter (Signed)
Pt came to office and requested to speak to [facility].  Pt complained of cramping and general feeling unwell and wanted advice if it could be medication related. Pt states he is going to see a new cardiology office within a different system today, but he cannot remember the name. Clarisa Kindred, FNP ordered labs to be drawn today, and we will call patient with results and to schedule a follow up appointment later this week as needed.  Pt verbalized agreement and thanks. Labs ordered.

## 2023-04-14 NOTE — Progress Notes (Unsigned)
Arlyss Repress, MD [address]  [address]  [place], [place] [address]  Main: [phone]  Fax: [phone]    Gastroenterology Consultation  Referring Provider:     Jacky Kindle, FNP Primary Care Physician:  Jacky Kindle, FNP Primary Gastroenterologist:  Dr. Arlyss Repress Reason for Consultation: Upper abdominal pain        HPI:   Gabriel [name]. is a 52 y.o. male referred by Jacky Kindle, FNP  for consultation & management of chronic history of upper abdominal pain associated with sulfur burps, postprandial abdominal discomfort, burping and regurgitation.  This has been ongoing for at least a year.  He has multiple comorbidities including metabolic syndrome, CHF, CKD, compensated cirrhosis of liver.  Patient used to smoke and drink alcohol, quit both at least 2 to 3 years ago.  He has defibrillator in place.  He is trying to manage his diabetes by following the diet and adherence to his medications.  Follow-up visit 09/30/2022 Patient is here for follow-up of ongoing symptoms as described during his initial visit.  He underwent upper endoscopy which revealed gastric erosions and superficial clean-based gastric ulcer.  No evidence of H. pylori.  Colonoscopy was unremarkable.  I started him on omeprazole 40 mg twice daily before meals which he has been taking.  Patient reports that he noticed temporary improvement in his symptoms but since December, they have been the same as before.  He also reports change in bowel habits, alternating between normal and loose bowel movements.  He has stopped smoking and is not drinking alcohol.  Continues to take Ozempic along with insulin  NSAIDs: None  Antiplts/Anticoagulants/Anti thrombotics: Xarelto  GI Procedures:  EGD and colonoscopy 08/01/2022 DIAGNOSIS:  A. 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  - NEGATIVE FOR H. PYLORI, INTESTINAL METAPLASIA, DYSPLASIA, AND  MALIGNANCY.    B. COLON POLYP X 2, DESCENDING; COLD SNARE:  - TUBULAR ADENOMA (1).  - HYPERPLASTIC POLYP (1).  - NEGATIVE FOR HIGH-GRADE DYSPLASIA AND MALIGNANCY.  He denies family history of GI malignancy  Past Medical History:  Diagnosis Date   Acute on chronic combined systolic and diastolic CHF (congestive heart failure) (HCC) 09/02/2017   CAD (coronary artery disease)    a. 04/2015 low risk MV;  b. 12/2016 Cath: minor irregs in LAD/Diag/LCX/OM, RCA 40p/m/d; c. 05/2020 Cath: LM nl, LAD 50d, LCX 30p, OM1 40, RCA 100p w/ L->R collats. CO/CI 3.1/1.3-->Med rx.   Chest wall pain, chronic    Chronic Troponin Elevation    CKD (chronic kidney disease), stage II-III    COPD (chronic obstructive pulmonary disease) (HCC)    Diabetes mellitus without complication (HCC)    HFrEF (heart failure with reduced ejection fraction) (HCC)    a. 03/2015 Echo: EF 45-50%; b. 12/2015 Echo: EF 20-25%; c. 02/2016 Echo: EF 30-35%; d. 11/2016 Echo: EF 40-45%; e. 06/2019 Echo: EF 30-35%; f. 11/2019 Echo: EF 25-30%; g. 05/2020 Echo: EF 35-40%; h. 05/2021 Echo: EF 40-45%; i. 11/2021 Echo: EF 20-25%, glob HK, mild LVH, GrIII DD, sev red RV fxn, mild MR.   Hypertension    Mitral regurgitation    Mild to moderate by October 2021 echocardiogram.   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 03/2015 Echo: EF 45-50%; b. 12/2015 Echo: EF 20-25%; c. 02/2016 Echo: EF 30-35%; d. 11/2016 Echo: EF 40-45%; e. 06/2019 Echo: EF 30-35%; f. 11/2019 Echo: EF 25-30%; g. 05/2020  Echo: EF 35-40%; h. 05/2021 Echo: EF 40-45%; i. 11/2021 Echo: EF 20-25%   Myocardial infarct (HCC)    NSVT (nonsustained ventricular tachycardia) (HCC)    a. 12/2015 noted on tele-->amio;  b. 12/2015 Event monitor: no VT noted.   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  12/31/19: RUL and RLL PEs.   Syncope    a. 01/2016 - felt to be vasovagal.    Past Surgical History:  Procedure  Laterality Date   AMPUTATION     CARDIAC CATHETERIZATION     COLONOSCOPY WITH PROPOFOL N/A 08/01/2022   Procedure: COLONOSCOPY WITH PROPOFOL;  Surgeon: Toney Reil, MD;  Location: [facility] ENDOSCOPY;  Service: Gastroenterology;  Laterality: N/A;   ESOPHAGOGASTRODUODENOSCOPY (EGD) WITH PROPOFOL N/A 08/01/2022   Procedure: ESOPHAGOGASTRODUODENOSCOPY (EGD) WITH PROPOFOL;  Surgeon: Toney Reil, MD;  Location: [facility] ENDOSCOPY;  Service: Gastroenterology;  Laterality: N/A;   ESOPHAGOGASTRODUODENOSCOPY (EGD) WITH PROPOFOL N/A 10/16/2022   Procedure: ESOPHAGOGASTRODUODENOSCOPY (EGD) WITH PROPOFOL;  Surgeon: Toney Reil, MD;  Location: [facility] ENDOSCOPY;  Service: Gastroenterology;  Laterality: N/A;   FINGER AMPUTATION     Traumatic   FINGER FRACTURE SURGERY Left    FRACTURE SURGERY     LEFT HEART CATH AND CORONARY ANGIOGRAPHY N/A 01/06/2017   Procedure: Left Heart Cath and Coronary Angiography;  Surgeon: Iran Ouch, MD;  Location: [facility] INVASIVE CV LAB;  Service: Cardiovascular;  Laterality: N/A;   RIGHT/LEFT HEART CATH AND CORONARY ANGIOGRAPHY N/A 06/13/2020   Procedure: RIGHT/LEFT HEART CATH AND CORONARY ANGIOGRAPHY;  Surgeon: Yvonne Kendall, MD;  Location: [facility] INVASIVE CV LAB;  Service: Cardiovascular;  Laterality: N/A;   SUBQ ICD IMPLANT N/A 05/20/2022   Procedure: SUBQ ICD IMPLANT;  Surgeon: Lanier Prude, MD;  Location: [facility] INVASIVE CV LAB;  Service: Cardiovascular;  Laterality: N/A;     Current Outpatient Medications:    Accu-Chek Softclix Lancets lancets, USE UP TO FOUR TIMES DAILY, Disp: , Rfl:    albuterol (VENTOLIN HFA) 108 (90 Base) MCG/ACT inhaler, Inhale 2 puffs into the lungs every 4 (four) hours as needed for wheezing or shortness of breath., Disp: 8 g, Rfl: 6   allopurinol (ZYLOPRIM) 300 MG tablet, Take 300 mg by mouth daily., Disp: , Rfl:    azelastine (ASTELIN) 0.1 % nasal spray, Place 2 sprays into both nostrils 2 (two) times daily. Use in each  nostril as directed, Disp: 30 mL, Rfl: 12   BD PEN NEEDLE NANO 2ND GEN 32G X 4 MM MISC, USE  AS DIRECTED AT BEDTIME, Disp: 200 each, Rfl: 0   blood glucose meter kit and supplies KIT, Dispense based on patient and insurance preference. Use up to four times daily as directed., Disp: 1 each, Rfl: 11   buPROPion (WELLBUTRIN XL) 150 MG 24 hr tablet, Take 1 tablet (150 mg total) by mouth daily., Disp: 30 tablet, Rfl: 1   carvedilol (COREG) 6.25 MG tablet, Take 2 tablets (12.5 mg total) by mouth 2 (two) times daily. Increased from 6.25 mg 2 times daily., Disp: 120 tablet, Rfl: 0   cetirizine (ZYRTEC) 10 MG tablet, Take 1 tablet (10 mg total) by mouth daily., Disp: 30 tablet, Rfl: 11   colchicine 0.6 MG tablet, Day 1: Take 2 tablets PO for gout flare, repeat 1 tablet one hour following initial dose. Day 2: Take 1 tablet twice daily PO for an additional 2-3 days., Disp: 60 tablet, Rfl: 0   Continuous  Blood Gluc Sensor (FREESTYLE LIBRE 3 SENSOR) MISC, Place 1 sensor on the skin every 14 days. Use to check glucose continuously, Disp: 6 each, Rfl: 3   cyclobenzaprine (FLEXERIL) 5 MG tablet, Take 1 tablet (5 mg total) by mouth as needed., Disp: 10 tablet, Rfl: 0   dapagliflozin propanediol (FARXIGA) 10 MG TABS tablet, Take 1 tablet (10 mg total) by mouth daily., Disp: 30 tablet, Rfl: 5   fenofibrate (TRICOR) 145 MG tablet, Take 1 tablet (145 mg total) by mouth daily., Disp: 90 tablet, Rfl: 3   FLUoxetine (PROZAC) 20 MG capsule, Take 1 capsule (20 mg total) by mouth daily., Disp: 90 capsule, Rfl: 3   fluticasone (FLONASE) 50 MCG/ACT nasal spray, Place 2 sprays into both nostrils daily as needed. Home med., Disp: , Rfl:    icosapent Ethyl (VASCEPA) 1 g capsule, Take 2 capsules (2 g total) by mouth 2 (two) times daily., Disp: 360 capsule, Rfl: 1   insulin aspart (NOVOLOG) 100 UNIT/ML FlexPen, Inject 10 Units into the skin 3 (three) times daily with meals. Inject 10 units + correction before breakfast/lunch and supper  (correction = 1 unit per 50 mg/dL above 784); max daily dose 36 units, Disp: 15 mL, Rfl: 3   Insulin Glargine (BASAGLAR KWIKPEN) 100 UNIT/ML, Inject 18 Units into the skin at bedtime. Reduced from 38 units., Disp: 15 mL, Rfl: 3   insulin glargine-yfgn (SEMGLEE) 100 UNIT/ML injection, Inject into the skin., Disp: , Rfl:    meclizine (ANTIVERT) 25 MG tablet, Take 1 tablet (25 mg total) by mouth 3 (three) times daily as needed for dizziness., Disp: 60 tablet, Rfl: 1   naltrexone (DEPADE) 50 MG tablet, Take 1 tablet (50 mg total) by mouth daily., Disp: 30 tablet, Rfl: 2   nitroGLYCERIN (NITROSTAT) 0.4 MG SL tablet, Place 1 tablet (0.4 mg total) under the tongue every 5 (five) minutes x 3 doses as needed for chest pain., Disp: 30 tablet, Rfl: 0   omeprazole (PRILOSEC) 40 MG capsule, Take 1 capsule by mouth once daily, Disp: 90 capsule, Rfl: 0   potassium chloride SA (KLOR-CON M) 20 MEQ tablet, Take 1 tablet (20 mEq total) by mouth every other day., Disp: 15 tablet, Rfl: 6   rivaroxaban (XARELTO) 20 MG TABS tablet, Take 1 tablet (20 mg total) by mouth daily with supper., Disp: 90 tablet, Rfl: 3   rosuvastatin (CRESTOR) 40 MG tablet, Take 1 tablet (40 mg total) by mouth daily., Disp: 90 tablet, Rfl: 3   sacubitril-valsartan (ENTRESTO) 97-103 MG, Hold until followup with cardiology due to acute kidney injury., Disp: , Rfl:    spironolactone (ALDACTONE) 25 MG tablet, Hold until followup with cardiology due to acute kidney injury., Disp: , Rfl:    TALTZ 80 MG/ML SOAJ, Inject 3 mLs into the skin every 14 (fourteen) days., Disp: , Rfl:    tirzepatide (MOUNJARO) 5 MG/0.5ML Pen, Inject 5 mg into the skin once a week., Disp: 2 mL, Rfl: 0   torsemide (DEMADEX) 20 MG tablet, Take 2 tablets (40 mg total) by mouth daily. Alternating days 60 mg and 40 mg.  Home med., Disp: 216 tablet, Rfl: 3   umeclidinium-vilanterol (ANORO ELLIPTA) 62.5-25 MCG/ACT AEPB, Inhale 1 puff into the lungs daily., Disp: 30 each, Rfl: 12    Insulin Pen Needle 32G X 4 MM MISC, 1 each by Does not apply route in the morning, at noon, in the evening, and at bedtime., Disp: 120 each, Rfl: 11   Family History  Problem Relation Age  of Onset   Diabetes Mother    Diabetes Mellitus II Mother    Hypothyroidism Mother    Hypertension Mother    Kidney failure Mother        Dialysis   Heart attack Mother        9 yo approximately   Hypertension Father    Gout Father    Cancer Maternal Grandfather    Diabetes Maternal Grandfather    Cancer Paternal Aunt      Social History   Tobacco Use   Smoking status: Former    Current packs/day: 0.00    Average packs/day: 0.5 packs/day for 33.0 years (16.5 ttl pk-yrs)    Types: Cigarettes    Start date: 05/09/1987    Quit date: 05/08/2020    Years since quitting: 2.9   Smokeless tobacco: Former    Quit date: 05/08/2020   Tobacco comments:    Quit Sept 2021  Vaping Use   Vaping status: Former  Substance Use Topics   Alcohol use: Not Currently    Comment: occassionally   Drug use: No    Allergies as of 04/14/2023 - Review Complete 04/14/2023  Allergen Reaction Noted   Metformin and related Nausea And Vomiting 11/22/2019   Zantac [ranitidine hcl] Diarrhea and Nausea Only 08/27/2018    Review of Systems:    All systems reviewed and negative except where noted in HPI.   Physical Exam:  BP 108/71 (BP Location: Left Arm, Patient Position: Sitting, Cuff Size: Large)   Pulse 85   Temp 98.1 F (36.7 C) (Oral)   Ht 5\' 11"  (1.803 m)   Wt 246 lb 8 oz (111.8 kg)   BMI 34.38 kg/m  No LMP for male patient.  General:   Alert,  Well-developed, well-nourished, pleasant and cooperative in NAD Head:  Normocephalic and atraumatic. Eyes:  Sclera clear, no icterus.   Conjunctiva pink. Ears:  Normal auditory acuity. Nose:  No deformity, discharge, or lesions. Mouth:  No deformity or lesions,oropharynx pink & moist. Neck:  Supple; no masses or thyromegaly. Lungs:  Respirations even and  unlabored.  Clear throughout to auscultation.   No wheezes, crackles, or rhonchi. No acute distress. Heart:  Regular rate and rhythm; no murmurs, clicks, rubs, or gallops. Abdomen:  Normal bowel sounds. Soft, non-tender and non-distended without masses, hepatosplenomegaly or hernias noted.  No guarding or rebound tenderness.   Rectal: Not performed Msk:  Symmetrical without gross deformities. Good, equal movement & strength bilaterally. Pulses:  Normal pulses noted. Extremities:  No clubbing or edema.  No cyanosis. Neurologic:  Alert and oriented x3;  grossly normal neurologically. Skin:  Intact without significant lesions or rashes. No jaundice. Psych:  Alert and cooperative. Normal mood and affect.  Imaging Studies: Reviewed  Assessment and Plan:   Gabriel [name]. is a 52 y.o. Caucasian male with metabolic syndrome, poorly controlled diabetes, CHF, nonischemic cardiomyopathy, nonobstructive coronary artery disease AICD, CKD is seen in consultation for compensated cirrhosis of liver, symptoms of dyspepsia  Compensated cirrhosis of liver: Secondary to [facility] Right upper quadrant ultrasound revealed nodularity of the liver with severe hepatic steatosis.  No evidence of portal hypertension EGD with no evidence of varices Maintain strict low-sodium diet No evidence of liver lesions based on the recent imaging Tight control of diabetes Continue to remain abstinent from alcohol use  Chronic upper abdominal pain, burning in nature associated with nausea, frequent burping, change in bowel habits EGD revealed gastric erosions and superficial gastric ulcers, no evidence of  H. pylori Continue omeprazole 40 mg twice daily Recommend repeat EGD to confirm healing of gastric ulcers Patient had right upper quadrant ultrasound in 4/23 which was normal, HIDA scan was negative CT abdomen pelvis with contrast in 03/2021 revealed normal-appearing pancreas Recommend gastric emptying study to evaluate for  gastroparesis Check pancreatic fecal elastase levels ?  Side effect of Ozempic, will discuss with PCP if it is okay to hold Ozempic for a month and see if his symptoms improve If above workup is negative and symptoms are persistent, will refer to general surgery to evaluate for gallbladder etiology  Follow up in 4 months   Arlyss Repress, MD

## 2023-04-14 NOTE — Patient Instructions (Addendum)
probiotics you could try as below   Culturelle [place] VSL #3 Lactobacillus  Gave Ibguard samples if they work you can buy them over the counter   Lactose-Free Diet, Adult If you have lactose intolerance, you are not able to digest lactose. Lactose is a natural sugar found mainly in dairy milk and dairy products. A lactose-free diet can help you avoid foods and beverages that contain lactose. What are tips for following this plan? Reading food labels Do not consume foods, beverages, vitamins, minerals, or medicines containing lactose. Read ingredient lists carefully. Look for the words "lactose-free" on labels. Meal planning Use alternatives to dairy milk and foods made with milk products. These include the following: Lactose-free milk. Soy milk with added calcium and vitamin D. Almond milk, coconut milk, rice milk, or other nondairy milk alternatives with added calcium and vitamin D. Note that a lot of these are low in protein. Soy products, such as soy yogurt, soy cheese, soy ice cream, and soy-based sour cream. Other nut milk products, such as almond yogurt, almond cheese, cashew yogurt, cashew cheese, cashew ice cream, coconut yogurt, and coconut ice cream. Medicines, vitamins, and supplements Use lactase enzyme drops or tablets as directed by your health care provider. Make sure you get enough calcium and vitamin D in your diet. A lactose-free eating plan can be lacking in these important nutrients. Take calcium and vitamin D supplements as directed by your health care provider. Talk with your health care provider about supplements if you are not able to get enough calcium and vitamin D from food. What foods should I eat?  Fruits All fresh, canned, frozen, or dried fruits and fruit juices that are not processed with lactose. Vegetables All fresh, frozen, and canned vegetables without cheese, cream, or butter sauces. Grains Any that are not made with dairy milk or dairy  products. Meats and other proteins Any meat, fish, poultry, and other protein sources that are not made with dairy milk or dairy products. Fats and oils Any that are not made with dairy milk or dairy products. Sweets and desserts Any that are not made with dairy milk or dairy products. Seasonings and condiments Any that are not made with dairy milk or dairy products. Calcium Calcium is found in many foods that contain lactose and is important for bone health. The amount of calcium you need depends on your age: Adults younger than 50 years: 1,000 mg of calcium a day. Adults older than 50 years: 1,200 mg of calcium a day. If you are not getting enough calcium, you may get it from other sources, including: Orange juice that has been fortified with calcium. This means that calcium has been added to the product. There are 300-350 mg of calcium in 1 cup (237 mL) of calcium-fortified orange juice. Soy milk fortified with calcium. There are 300-400 mg of calcium in 1 cup (237 mL) of calcium-fortified soy milk. Rice or almond milk fortified with calcium. There are 300 mg of calcium in 1 cup (237 mL) of calcium-fortified rice or almond milk. Breakfast cereals fortified with calcium. There are 100-1,000 mg of calcium in calcium-fortified breakfast cereals. Spinach, cooked. There are 145 mg of calcium in  cup (90 g) of cooked spinach. Edamame, cooked. There are 130 mg of calcium in  cup (47 g) of cooked edamame. Collard greens, cooked. There are 125 mg of calcium in  cup (85 g) of cooked collard greens. Kale, frozen or cooked. There are 90 mg of calcium in  cup (59 g) of cooked or frozen kale. Almonds. There are 95 mg of calcium in  cup (35 g) of almonds. Broccoli, cooked. There are 60 mg of calcium in 1 cup (156 g) of cooked broccoli. The items listed above may not be a complete list of foods and beverages you can eat. Contact a dietitian for more options. What foods should I avoid? Lactose is  found in dairy milk and dairy products, such as: Yogurt. Cheese. Butter. Margarine. Sour cream. Cream. Whipped toppings and creamers. Ice cream and other dairy-based desserts. Lactose is also found in foods or products made with dairy milk or milk ingredients. To find out whether a food contains dairy milk or a milk ingredient, look at the ingredients list. Avoid foods with the statement "May contain milk" and foods that contain: Milk powder. Whey. Curd. Lactose. Lactoglobulin. The items listed above may not be a complete list of foods and beverages to avoid. Contact a dietitian for more information. Where to find more information [employer] of Diabetes and Digestive and Kidney Diseases: [employer] Summary If you are lactose intolerant, it means that you are not able to digest lactose, a natural sugar found in milk and milk products. Following a lactose-free diet can help you manage this condition. Calcium is important for bone health and is found in many foods that contain lactose. Talk with your health care provider about other sources of calcium. This information is not intended to replace advice given to you by your health care provider. Make sure you discuss any questions you have with your health care provider. Document Revised: 07/11/2020 Document Reviewed: 07/11/2020 Elsevier Patient Education  2024 [employer].

## 2023-04-15 ENCOUNTER — Telehealth: Payer: Self-pay

## 2023-04-15 DIAGNOSIS — M778 Other enthesopathies, not elsewhere classified: Secondary | ICD-10-CM | POA: Diagnosis not present

## 2023-04-15 DIAGNOSIS — K7460 Unspecified cirrhosis of liver: Secondary | ICD-10-CM

## 2023-04-15 DIAGNOSIS — M25512 Pain in left shoulder: Secondary | ICD-10-CM | POA: Diagnosis not present

## 2023-04-15 DIAGNOSIS — K529 Noninfective gastroenteritis and colitis, unspecified: Secondary | ICD-10-CM | POA: Diagnosis not present

## 2023-04-15 LAB — MISC LABCORP TEST (SEND OUT): Labcorp test code: 143000

## 2023-04-15 NOTE — Telephone Encounter (Signed)
Order lab work and ultrasound and called and got ultrasound schedule for patient on 04/23/2023 [facility] medical mall at 8:45am for a 9:00am scan. Nothing to eat or drink after midnight. Called patient and patient verbalized understanding he will have ultrasound done and will have lab work done either the same day or when he drops off his stool kits

## 2023-04-15 NOTE — Telephone Encounter (Signed)
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

## 2023-04-16 ENCOUNTER — Ambulatory Visit (INDEPENDENT_AMBULATORY_CARE_PROVIDER_SITE_OTHER): Payer: Medicaid Other | Admitting: Family Medicine

## 2023-04-16 ENCOUNTER — Encounter: Payer: Self-pay | Admitting: Family Medicine

## 2023-04-16 VITALS — BP 112/71 | HR 84 | Temp 98.2°F | Resp 16 | Ht 71.0 in | Wt 245.6 lb

## 2023-04-16 DIAGNOSIS — N179 Acute kidney failure, unspecified: Secondary | ICD-10-CM

## 2023-04-16 DIAGNOSIS — Z794 Long term (current) use of insulin: Secondary | ICD-10-CM

## 2023-04-16 DIAGNOSIS — K7581 Nonalcoholic steatohepatitis (NASH): Secondary | ICD-10-CM | POA: Diagnosis not present

## 2023-04-16 DIAGNOSIS — E785 Hyperlipidemia, unspecified: Secondary | ICD-10-CM

## 2023-04-16 DIAGNOSIS — F339 Major depressive disorder, recurrent, unspecified: Secondary | ICD-10-CM

## 2023-04-16 DIAGNOSIS — E1169 Type 2 diabetes mellitus with other specified complication: Secondary | ICD-10-CM | POA: Diagnosis not present

## 2023-04-16 DIAGNOSIS — K7460 Unspecified cirrhosis of liver: Secondary | ICD-10-CM | POA: Diagnosis not present

## 2023-04-16 LAB — POCT GLYCOSYLATED HEMOGLOBIN (HGB A1C): Hemoglobin A1C: 6.4 % — AB (ref 4.0–5.6)

## 2023-04-16 MED ORDER — FLUOXETINE HCL 20 MG PO CAPS
20.0000 mg | ORAL_CAPSULE | Freq: Every day | ORAL | 3 refills | Status: DC
Start: 2023-04-16 — End: 2023-09-16

## 2023-04-16 MED ORDER — DIAZEPAM 5 MG PO TABS: 5.0000 mg | ORAL_TABLET | Freq: Two times a day (BID) | ORAL | 5 refills | Status: DC | PRN

## 2023-04-16 MED ORDER — BUPROPION HCL ER (XL) 150 MG PO TB24: 150.0000 mg | ORAL_TABLET | Freq: Every day | ORAL | 3 refills | Status: DC

## 2023-04-16 NOTE — Assessment & Plan Note (Signed)
Acute on chronic CKD Repeat labs next week per pt report under care of cards Has appt for [place] for liver; encouraged scheduling for renal US at same time Recommend urine lytes to assist

## 2023-04-16 NOTE — Progress Notes (Signed)
Established patient visit  Patient: Gabriel [name].   DOB: 09-06-1970   52 y.o. Male  MRN: [id number] Visit Date: 04/16/2023  Today's healthcare provider: Jacky Kindle, FNP  Introduced to nurse practitioner role and practice setting.  All questions answered.  Discussed provider/patient relationship and expectations.  Subjective    HPI  Medications: Outpatient Medications Prior to Visit  Medication Sig   Accu-Chek Softclix Lancets lancets USE UP TO FOUR TIMES DAILY   albuterol (VENTOLIN HFA) 108 (90 Base) MCG/ACT inhaler Inhale 2 puffs into the lungs every 4 (four) hours as needed for wheezing or shortness of breath.   allopurinol (ZYLOPRIM) 300 MG tablet Take 300 mg by mouth daily.   azelastine (ASTELIN) 0.1 % nasal spray Place 2 sprays into both nostrils 2 (two) times daily. Use in each nostril as directed   BD PEN NEEDLE NANO 2ND GEN 32G X 4 MM MISC USE  AS DIRECTED AT BEDTIME   blood glucose meter kit and supplies KIT Dispense based on patient and insurance preference. Use up to four times daily as directed.   carvedilol (COREG) 6.25 MG tablet Take 2 tablets (12.5 mg total) by mouth 2 (two) times daily. Increased from 6.25 mg 2 times daily.   cetirizine (ZYRTEC) 10 MG tablet Take 1 tablet (10 mg total) by mouth daily.   colchicine 0.6 MG tablet Day 1: Take 2 tablets PO for gout flare, repeat 1 tablet one hour following initial dose. Day 2: Take 1 tablet twice daily PO for an additional 2-3 days.   Continuous Blood Gluc Sensor (FREESTYLE LIBRE 3 SENSOR) MISC Place 1 sensor on the skin every 14 days. Use to check glucose continuously   cyclobenzaprine (FLEXERIL) 5 MG tablet Take 1 tablet (5 mg total) by mouth as needed.   dapagliflozin propanediol (FARXIGA) 10 MG TABS tablet Take 1 tablet (10 mg total) by mouth daily.   fenofibrate (TRICOR) 145 MG tablet Take 1 tablet (145 mg total) by mouth daily.   fluticasone (FLONASE) 50 MCG/ACT nasal spray Place 2 sprays into both nostrils  daily as needed. Home med.   icosapent Ethyl (VASCEPA) 1 g capsule Take 2 capsules (2 g total) by mouth 2 (two) times daily.   insulin aspart (NOVOLOG) 100 UNIT/ML FlexPen Inject 10 Units into the skin 3 (three) times daily with meals. Inject 10 units + correction before breakfast/lunch and supper (correction = 1 unit per 50 mg/dL above 295); max daily dose 36 units   Insulin Glargine (BASAGLAR KWIKPEN) 100 UNIT/ML Inject 18 Units into the skin at bedtime. Reduced from 38 units.   insulin glargine-yfgn (SEMGLEE) 100 UNIT/ML injection Inject into the skin.   Insulin Pen Needle 32G X 4 MM MISC 1 each by Does not apply route in the morning, at noon, in the evening, and at bedtime.   meclizine (ANTIVERT) 25 MG tablet Take 1 tablet (25 mg total) by mouth 3 (three) times daily as needed for dizziness.   metoCLOPramide (REGLAN) 5 MG tablet Take 1 tablet (5 mg total) by mouth every 8 (eight) hours as needed for nausea.   naltrexone (DEPADE) 50 MG tablet Take 1 tablet (50 mg total) by mouth daily.   nitroGLYCERIN (NITROSTAT) 0.4 MG SL tablet Place 1 tablet (0.4 mg total) under the tongue every 5 (five) minutes x 3 doses as needed for chest pain.   omeprazole (PRILOSEC) 40 MG capsule Take 1 capsule by mouth once daily   potassium chloride SA (KLOR-CON M) 20 MEQ  tablet Take 1 tablet (20 mEq total) by mouth every other day.   rivaroxaban (XARELTO) 20 MG TABS tablet Take 1 tablet (20 mg total) by mouth daily with supper.   rosuvastatin (CRESTOR) 40 MG tablet Take 1 tablet (40 mg total) by mouth daily.   sacubitril-valsartan (ENTRESTO) 97-103 MG Hold until followup with cardiology due to acute kidney injury.   spironolactone (ALDACTONE) 25 MG tablet Hold until followup with cardiology due to acute kidney injury.   TALTZ 80 MG/ML SOAJ Inject 3 mLs into the skin every 14 (fourteen) days.   tirzepatide [facility]) 5 MG/0.5ML Pen Inject 5 mg into the skin once a week.   torsemide (DEMADEX) 20 MG tablet Take 2 tablets  (40 mg total) by mouth daily.   umeclidinium-vilanterol (ANORO ELLIPTA) 62.5-25 MCG/ACT AEPB Inhale 1 puff into the lungs daily.   [DISCONTINUED] buPROPion (WELLBUTRIN XL) 150 MG 24 hr tablet Take 1 tablet (150 mg total) by mouth daily.   [DISCONTINUED] FLUoxetine (PROZAC) 20 MG capsule Take 1 capsule (20 mg total) by mouth daily.   No facility-administered medications prior to visit.    Review of Systems    Objective    BP 112/71 (BP Location: Left Arm, Patient Position: Sitting, Cuff Size: Large)   Pulse 84   Temp 98.2 F (36.8 C) (Temporal)   Resp 16   Ht 5\' 11"  (1.803 m)   Wt 245 lb 9.6 oz (111.4 kg)   SpO2 97%   BMI 34.25 kg/m   Physical Exam Vitals and nursing note reviewed.  Constitutional:      Appearance: Normal appearance. He is obese.  HENT:     Head: Normocephalic and atraumatic.  Eyes:     Pupils: Pupils are equal, round, and reactive to light.  Cardiovascular:     Rate and Rhythm: Normal rate and regular rhythm.     Pulses: Normal pulses.     Heart sounds: Normal heart sounds.  Pulmonary:     Effort: Pulmonary effort is normal.     Breath sounds: Normal breath sounds.  Musculoskeletal:        General: Normal range of motion.     Cervical back: Normal range of motion.  Skin:    General: Skin is warm and dry.     Capillary Refill: Capillary refill takes less than 2 seconds.  Neurological:     General: No focal deficit present.     Mental Status: He is alert and oriented to person, place, and time. Mental status is at baseline.  Psychiatric:        Mood and Affect: Mood normal.        Behavior: Behavior normal.        Thought Content: Thought content normal.        Judgment: Judgment normal.     Comments: Fatigued/worn down       Results for orders placed or performed in visit on 04/16/23  POCT glycosylated hemoglobin (Hb A1C)  Result Value Ref Range   Hemoglobin A1C 6.4 (A) 4.0 - 5.6 %    Assessment & Plan     Problem List Items Addressed  This Visit       Endocrine   Type 2 diabetes mellitus with hyperlipidemia (HCC)    Chronic, stable Continue medications as previously prescribed Referral placed for DM eye exam Encouraged consistent hydration to assist kidney function and medication mgmt       Relevant Orders   POCT glycosylated hemoglobin (Hb A1C) (Completed)   US  Renal   Sodium, urine, random   Osmolality, urine   Urine Microalbumin w/creat. ratio   Ambulatory referral to Ophthalmology     Genitourinary   AKI (acute kidney injury) (HCC) - Primary    Acute on chronic CKD Repeat labs next week per pt report under care of cards Has appt for [place] for liver; encouraged scheduling for renal US at same time Recommend urine lytes to assist       Relevant Orders   US Renal   Sodium, urine, random   Osmolality, urine   Urine Microalbumin w/creat. ratio     Other   Depression, recurrent (HCC)    Chronic, worsening Reports ongoing dizziness and fatigue is exacerbating his mood Continues on prozac 20 and wellbutrin 150 Will add low dose valium 2.5-5 mg bid prn to assist F/u as needed  Pt denies SI or SP      Relevant Medications   buPROPion (WELLBUTRIN XL) 150 MG 24 hr tablet   FLUoxetine (PROZAC) 20 MG capsule   diazepam (VALIUM) 5 MG tablet    Return in about 3 months (around 07/17/2023), or if symptoms worsen or fail to improve, for chonic disease management.      Leilani Merl, FNP, have reviewed all documentation for this visit. The documentation on 04/16/23 for the exam, diagnosis, procedures, and orders are all accurate and complete.  Jacky Kindle, FNP  [facility] [phone] (phone) [phone] (fax)  [facility]

## 2023-04-16 NOTE — Assessment & Plan Note (Signed)
Chronic, stable Continue medications as previously prescribed Referral placed for DM eye exam Encouraged consistent hydration to assist kidney function and medication mgmt

## 2023-04-16 NOTE — Assessment & Plan Note (Signed)
Chronic, worsening Reports ongoing dizziness and fatigue is exacerbating his mood Continues on prozac 20 and wellbutrin 150 Will add low dose valium 2.5-5 mg bid prn to assist F/u as needed  Pt denies SI or SP

## 2023-04-17 LAB — OSMOLALITY, URINE: Osmolality, Ur: 357 mosm/kg

## 2023-04-17 LAB — MICROALBUMIN / CREATININE URINE RATIO
Creatinine, Urine: 25.9 mg/dL
Microalb/Creat Ratio: 25 mg/g{creat} (ref 0–29)
Microalbumin, Urine: 6.4 ug/mL

## 2023-04-17 LAB — SODIUM, URINE, RANDOM: Sodium, Ur: 73 mmol/L

## 2023-04-18 ENCOUNTER — Ambulatory Visit: Payer: Medicaid Other | Admitting: Family Medicine

## 2023-04-18 DIAGNOSIS — G8929 Other chronic pain: Secondary | ICD-10-CM | POA: Diagnosis not present

## 2023-04-18 DIAGNOSIS — H524 Presbyopia: Secondary | ICD-10-CM | POA: Diagnosis not present

## 2023-04-18 DIAGNOSIS — E119 Type 2 diabetes mellitus without complications: Secondary | ICD-10-CM | POA: Diagnosis not present

## 2023-04-18 DIAGNOSIS — M25512 Pain in left shoulder: Secondary | ICD-10-CM | POA: Diagnosis not present

## 2023-04-18 DIAGNOSIS — H04123 Dry eye syndrome of bilateral lacrimal glands: Secondary | ICD-10-CM | POA: Diagnosis not present

## 2023-04-18 LAB — HM DIABETES EYE EXAM

## 2023-04-20 LAB — C DIFFICILE TOXINS A+B W/RFLX: C difficile Toxins A+B, EIA: NEGATIVE

## 2023-04-20 LAB — C DIFFICILE, CYTOTOXIN B

## 2023-04-20 LAB — GI PROFILE, STOOL, PCR

## 2023-04-21 ENCOUNTER — Encounter: Payer: Self-pay | Admitting: Emergency Medicine

## 2023-04-21 ENCOUNTER — Emergency Department
Admission: EM | Admit: 2023-04-21 | Discharge: 2023-04-21 | Disposition: A | Discharge status: Home / Self Care | Payer: Medicaid Other | Source: Home / Self Care | Attending: Emergency Medicine | Admitting: Emergency Medicine

## 2023-04-21 ENCOUNTER — Other Ambulatory Visit: Payer: Self-pay

## 2023-04-21 DIAGNOSIS — M10071 Idiopathic gout, right ankle and foot: Secondary | ICD-10-CM

## 2023-04-21 DIAGNOSIS — M109 Gout, unspecified: Secondary | ICD-10-CM | POA: Diagnosis not present

## 2023-04-21 DIAGNOSIS — I10 Essential (primary) hypertension: Secondary | ICD-10-CM | POA: Diagnosis not present

## 2023-04-21 DIAGNOSIS — E119 Type 2 diabetes mellitus without complications: Secondary | ICD-10-CM | POA: Diagnosis not present

## 2023-04-21 DIAGNOSIS — M79671 Pain in right foot: Secondary | ICD-10-CM | POA: Diagnosis present

## 2023-04-21 MED ORDER — PREDNISONE 20 MG PO TABS
60.0000 mg | ORAL_TABLET | Freq: Once | ORAL | Status: AC
  Administered 2023-04-21: 60 mg via ORAL
  Filled 2023-04-21: qty 3

## 2023-04-21 MED ORDER — HYDROCODONE-ACETAMINOPHEN 5-325 MG PO TABS
1.0000 | ORAL_TABLET | Freq: Once | ORAL | Status: AC
  Administered 2023-04-21: 1 via ORAL
  Filled 2023-04-21: qty 1

## 2023-04-21 MED ORDER — PREDNISONE 20 MG PO TABS: 20.0000 mg | ORAL_TABLET | Freq: Every day | ORAL | 0 refills | Status: DC

## 2023-04-21 NOTE — ED Provider Notes (Signed)
[facility] Emergency Department Provider Note     Event Date/Time   First MD Initiated Contact with Patient 04/21/23 1042     (approximate)   History   Foot Pain   HPI  Gabriel [name]. is a 52 y.o. male with a history of hypertension, diabetes and gout presents to the ED with complaint of right foot pain x 2 days.  Patient believes this is a flareup due to his gout.  Patient reports he consumed seafood Saturday night and has since felt pain in his foot.  No trauma or injury.  Patient picked up colchicine prescription yesterday.  Denies numbness and tingling.    Physical Exam   Triage Vital Signs: ED Triage Vitals  Encounter Vitals Group     BP 04/21/23 1055 139/85     Systolic BP Percentile --      Diastolic BP Percentile --      Pulse Rate 04/21/23 1055 83     Resp 04/21/23 1055 18     Temp 04/21/23 1055 98.1 F (36.7 C)     Temp Source 04/21/23 1055 Oral     SpO2 04/21/23 1055 97 %     Weight --      Height --      Head Circumference --      Peak Flow --      Pain Score 04/21/23 1058 10     Pain Loc --      Pain Education --      Exclude from Growth Chart --     Most recent vital signs: Vitals:   04/21/23 1055  BP: 139/85  Pulse: 83  Resp: 18  Temp: 98.1 F (36.7 C)  SpO2: 97%    General Awake, no distress.  HEENT NCAT. PERRL. EOMI. No rhinorrhea. Mucous membranes are moist.  CV:  Good peripheral perfusion.  RESP:  Normal effort.  ABD:  No distention.  Other:  Right foot reveals no deformity.  No warmth.  No rash or skin lesion noted.  Full AROM of digits and ankle joint.  TTP over dorsal aspect and lateral 1st MTP joint.  neurovascular status intact all throughout.  ED Results / Procedures / Treatments   Labs (all labs ordered are listed, but only abnormal results are displayed) Labs Reviewed - No data to display  No results found.  PROCEDURES:  Critical Care performed: No  Procedures   MEDICATIONS ORDERED  IN ED: Medications  HYDROcodone-acetaminophen (NORCO/VICODIN) 5-325 MG per tablet 1 tablet (1 tablet Oral Given 04/21/23 1129)  predniSONE (DELTASONE) tablet 60 mg (60 mg Oral Given 04/21/23 1128)     IMPRESSION / MDM / ASSESSMENT AND PLAN / ED COURSE  I reviewed the triage vital signs and the nursing notes.                               52 y.o. male presents to the emergency department for evaluation and treatment of right foot pain without injury. See HPI for further details. Vital signs are unremarkable and physical exam are pertinent for tenderness over first MTP joint of right foot.   Differential diagnosis includes, but is not limited to cellulitis, septic arthritis, fracture, acute gout flare, arthritis.  Patient's presentation is most consistent with acute illness / injury with system symptoms.  Patient's presentation is clinically consistent with an acute gout attack given physical exam findings stated above.  There was  no injury or trauma which lessens my suspicion for a fracture or bony abnormality.  Skin is not warm or erythemic indicating possible septic arthritis and skin appearance is normal ruling out cellulitis.  Given patient's history of recent seafood consumption which may have triggered the onset, I suspect gout.  Patient is in stable condition for discharge home with a prescription for prednisone.  I am reassured that patient was able to refill prescription for colchicine yesterday.  He is advised to continue this medication and follow-up with his primary care. Patient is given ED precautions to return to the ED for any worsening or new symptoms. Patient verbalizes understanding. All questions and concerns were addressed during ED visit.     FINAL CLINICAL IMPRESSION(S) / ED DIAGNOSES   Final diagnoses:  Acute idiopathic gout of right foot   Rx / DC Orders   ED Discharge Orders          Ordered    predniSONE (DELTASONE) 20 MG tablet  Daily        04/21/23 1126            Note:  This document was prepared using Dragon voice recognition software and may include unintentional dictation errors.    Kern Reap A, PA-C 04/21/23 1145    Jene Every, MD 04/21/23 1310

## 2023-04-21 NOTE — ED Notes (Signed)
See triage notes. Patient c/o right foot pain since Saturday. Stated he is already on gout medication but that it isn't helping.

## 2023-04-21 NOTE — ED Triage Notes (Signed)
Pt here with right foot pain since Sat. Pt state he was told he has gout but he has been taking his medication but it is not helping. Pt states pain is across the top of his foot. Pt denies injury.

## 2023-04-21 NOTE — Discharge Instructions (Addendum)
You were evaluated in the ED for what appears to be a gout flare up.  You will need to continue your colchicine that you just picked up and follow-up with your primary care.  Pick up prescription of prednisone from your pharmacy.  Please review low purine eating plan educational packet attached.  Avoid alcohol consumption and seafood until symptoms resolve.

## 2023-04-22 ENCOUNTER — Telehealth: Payer: Self-pay

## 2023-04-22 MED ORDER — RIFAXIMIN 550 MG PO TABS: 550.0000 mg | ORAL_TABLET | Freq: Three times a day (TID) | ORAL | 0 refills | Status: DC

## 2023-04-22 NOTE — Telephone Encounter (Signed)
-----   Message from [facility] sent at 04/22/2023  8:39 AM EDT ----- Stool studies came back negative for infection.  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  If his diarrhea is still persistent, recommend flexible sigmoidoscopy to evaluate for microscopic colitis  RV

## 2023-04-22 NOTE — Telephone Encounter (Signed)
Patient verbalized understanding of results and he will let us know if the diarrhea still persist after the treatment. Sent medication to the pharmacy

## 2023-04-23 ENCOUNTER — Ambulatory Visit
Admission: RE | Admit: 2023-04-23 | Discharge: 2023-04-23 | Disposition: A | Discharge status: Home / Self Care | Payer: Medicaid Other | Source: Ambulatory Visit | Attending: Family Medicine | Admitting: Family Medicine

## 2023-04-23 ENCOUNTER — Telehealth: Payer: Self-pay

## 2023-04-23 ENCOUNTER — Encounter: Payer: Self-pay | Admitting: Family

## 2023-04-23 ENCOUNTER — Ambulatory Visit
Admission: RE | Admit: 2023-04-23 | Discharge: 2023-04-23 | Disposition: A | Discharge status: Home / Self Care | Payer: Medicaid Other | Source: Ambulatory Visit | Attending: Gastroenterology | Admitting: Gastroenterology

## 2023-04-23 ENCOUNTER — Other Ambulatory Visit
Admission: RE | Admit: 2023-04-23 | Discharge: 2023-04-23 | Disposition: A | Discharge status: Home / Self Care | Payer: Medicaid Other | Source: Ambulatory Visit | Attending: Gastroenterology | Admitting: Gastroenterology

## 2023-04-23 DIAGNOSIS — K7460 Unspecified cirrhosis of liver: Secondary | ICD-10-CM | POA: Insufficient documentation

## 2023-04-23 DIAGNOSIS — N281 Cyst of kidney, acquired: Secondary | ICD-10-CM | POA: Diagnosis not present

## 2023-04-23 DIAGNOSIS — I5022 Chronic systolic (congestive) heart failure: Secondary | ICD-10-CM | POA: Diagnosis not present

## 2023-04-23 DIAGNOSIS — E1169 Type 2 diabetes mellitus with other specified complication: Secondary | ICD-10-CM

## 2023-04-23 DIAGNOSIS — K7581 Nonalcoholic steatohepatitis (NASH): Secondary | ICD-10-CM | POA: Diagnosis not present

## 2023-04-23 DIAGNOSIS — N179 Acute kidney failure, unspecified: Secondary | ICD-10-CM

## 2023-04-23 DIAGNOSIS — E785 Hyperlipidemia, unspecified: Secondary | ICD-10-CM | POA: Insufficient documentation

## 2023-04-23 LAB — BASIC METABOLIC PANEL
Anion gap: 10 (ref 5–15)
BUN: 30 mg/dL — ABNORMAL HIGH (ref 6–20)
CO2: 27 mmol/L (ref 22–32)
Calcium: 9.2 mg/dL (ref 8.9–10.3)
Chloride: 103 mmol/L (ref 98–111)
Creatinine, Ser: 1.36 mg/dL — ABNORMAL HIGH (ref 0.61–1.24)
GFR, Estimated: >60 mL/min (ref >60)
Glucose, Bld: 132 mg/dL — ABNORMAL HIGH (ref 70–99)
Potassium: 3.7 mmol/L (ref 3.5–5.1)
Sodium: 140 mmol/L (ref 135–145)

## 2023-04-23 NOTE — Telephone Encounter (Signed)
-----   Message from [facility] sent at 04/23/2023  2:35 PM EDT ----- Please inform patient that ultrasound of the liver did not reveal any cirrhosis of liver which is good news  [place] fibrosis panel is not back yet, can you please check on the status  RV

## 2023-04-23 NOTE — Telephone Encounter (Signed)
Tried to do PA on cover my meds but it said member not found. Called the insurance company and they said that the member do have insurance and answer questions. They said it would take 24 hours for a decision to be made    Case number [id number] Case number [id number]

## 2023-04-23 NOTE — Telephone Encounter (Signed)
Called [facility] and talk to sherry and  she state the test is still pending and it can take any where to 4 to 6 days after the test is set up in the lab. Asked her how much longer it would be then. This testing is done in 2 parts of the lab is special chemistry and then the main part of the lab. She states that the special chemistry part of the testing is done but they sent the main lab a email to find out how much longer the results would be.    Called and informed patient and patient verbalized understanding of results

## 2023-04-24 ENCOUNTER — Other Ambulatory Visit: Payer: Self-pay | Admitting: Family Medicine

## 2023-04-24 DIAGNOSIS — E1169 Type 2 diabetes mellitus with other specified complication: Secondary | ICD-10-CM

## 2023-04-24 LAB — NASH FIBROSURE(R) PLUS
ALPHA 2-MACROGLOBULINS, QN: 268 mg/dL (ref 110–276)
ALT (SGPT) P5P: 20 IU/L (ref 0–55)
AST (SGOT) P5P: 24 IU/L (ref 0–40)
Apolipoprotein A-1: 109 mg/dL (ref 101–178)
Bilirubin, Total: 0.8 mg/dL (ref 0.0–1.2)
Cholesterol, Total: 134 mg/dL (ref 100–199)
Fibrosis Score: 0.59 — ABNORMAL HIGH (ref 0.00–0.21)
GGT: 31 IU/L (ref 0–65)
Glucose: 132 mg/dL — ABNORMAL HIGH (ref 70–99)
Haptoglobin: 105 mg/dL (ref 29–370)
NASH Score: 0.67 — ABNORMAL HIGH (ref 0.00–0.25)
Steatosis Score: 0.6 — ABNORMAL HIGH (ref 0.00–0.40)
Triglycerides: 339 mg/dL — ABNORMAL HIGH (ref 0–149)

## 2023-04-24 LAB — AFP TUMOR MARKER: AFP, Serum, Tumor Marker: 4.8 ng/mL (ref 0.0–8.4)

## 2023-04-24 MED ORDER — METRONIDAZOLE 500 MG PO TABS: 500.0000 mg | ORAL_TABLET | Freq: Three times a day (TID) | ORAL | 0 refills | Status: AC

## 2023-04-24 NOTE — Telephone Encounter (Signed)
Insurance company Denied the medication for [employer]. They are wanting him to be on first Metronidazole tablet, Dicyclomine,  Lomotil

## 2023-04-24 NOTE — Telephone Encounter (Signed)
Informed patient of this information and he verbalized understanding  

## 2023-04-24 NOTE — Addendum Note (Signed)
Addended by: Radene Knee L on: 04/24/2023 09:29 AM   Modules accepted: Orders

## 2023-04-25 ENCOUNTER — Telehealth: Payer: Self-pay

## 2023-04-25 DIAGNOSIS — M25512 Pain in left shoulder: Secondary | ICD-10-CM | POA: Diagnosis not present

## 2023-04-25 DIAGNOSIS — G8929 Other chronic pain: Secondary | ICD-10-CM | POA: Diagnosis not present

## 2023-04-25 NOTE — Telephone Encounter (Signed)
Called and left a message for call back  

## 2023-04-25 NOTE — Telephone Encounter (Signed)
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  Please apply for this medication if patient is agreeable  He has to continue these, maintain healthy lifestyle  RV

## 2023-04-28 NOTE — Telephone Encounter (Signed)
Patient verbalized understanding of results. He states he would be okay with starting the medication Rezdiffra. He will come by our office tomorrow to sign the paperwork.

## 2023-04-29 ENCOUNTER — Telehealth: Payer: Self-pay

## 2023-04-29 NOTE — Telephone Encounter (Signed)
Faxed paper work for medication Rezdiffra to the speciality pharmacy. Got confirmation fax went through.

## 2023-05-06 ENCOUNTER — Telehealth: Payer: Self-pay

## 2023-05-06 ENCOUNTER — Ambulatory Visit: Payer: Medicaid Other | Attending: Family | Admitting: Internal Medicine

## 2023-05-06 VITALS — BP 151/83 | HR 86 | Wt 250.0 lb

## 2023-05-06 DIAGNOSIS — N1831 Chronic kidney disease, stage 3a: Secondary | ICD-10-CM | POA: Diagnosis not present

## 2023-05-06 DIAGNOSIS — I5022 Chronic systolic (congestive) heart failure: Secondary | ICD-10-CM

## 2023-05-06 DIAGNOSIS — I2510 Atherosclerotic heart disease of native coronary artery without angina pectoris: Secondary | ICD-10-CM | POA: Diagnosis not present

## 2023-05-06 DIAGNOSIS — G4733 Obstructive sleep apnea (adult) (pediatric): Secondary | ICD-10-CM | POA: Diagnosis not present

## 2023-05-06 NOTE — Telephone Encounter (Signed)
Patient states he will call them today to schedule the shipment of his medication. He will let me know when he receives it and starts on the medication

## 2023-05-06 NOTE — Telephone Encounter (Signed)
-----   Message from [facility] sent at 04/29/2023  8:45 AM EDT ----- Rezdiffra check on medication

## 2023-05-06 NOTE — Telephone Encounter (Signed)
Called the patient assistance company at [phone] and talk to [facility] and patient needs patient needs to contact the speciality pharmacy for shipment of the medication. The pharmacy the Speciality pharmacy is [facility] at [phone]. They did not say how much it would cost the patient. [facility] pharmacy and they state they do not have a high copay and they left a message for patient to schedule shipment on 05/02/2023.

## 2023-05-06 NOTE — Progress Notes (Signed)
[facility] HF CLINIC NOTE  Referring Physician: Julien Nordmann, MD Primary Care: Jacky Kindle, FNP Primary Cardiologist: Julien Nordmann, MD   HPI:  Gabriel [name] is a 52 y.o. male w/ a history including CAD, DM2, chronic chest pain, chronic troponin elevation, HFrEF, mixed ischemic and nonischemic cardiomyopathy, nonsustained VT, stage IIIa chronic kidney disease, COPD, unprovoked PE on chronic anticoagulation, type 2 diabetes mellitus, gout and obesity.  Referred by Dr. Mariah Milling for further management of his HF.  10/21 admitted with chest pain and elevated troponin. Cath with chronic total occlusion of the RCA with left-to-right collaterals.  He otherwise had moderate, nonobstructive disease including a 50% distal stenosis in the LAD, 30% proximal stenosis in the left circumflex, and a 40% OM1 lesion.  He was medically managed. Follow-up echo in October 2022, in the setting of recurrent hospitalization for HF, showed EF 40-45%.    Readmitted in 2/23 and 4/23 with HF.    Echo 02/06/22: EF 30-35%   Underwent SQ ICD placement 05/20/22  Has been following with PCP and A1c down to 7.5 in 9/23  Saw GI and planning EGD/colon. Has h/o cirrhosis from NASH  Echo 08/14/22 EF 40-45% RV ok   Echo 7/24 EF 45-50% RV ok   Here for routine f/u. Says he has good and bad days but overall doing ok. Still SOB with mild to moderate activity. Last time he used Furoscix says it pulled a lot of fluid off of him and he would up in hospital. Taking torsemide 40 daily and weight stable. Says he has been dizzy recently and pending brain MRI. Says SBP usually in 120s  Sleep study 12/23 AHI 45. Has CPAP but says it isn't working right. Following with Pulmonary    Past Medical History:  Diagnosis Date   Acute on chronic combined systolic and diastolic CHF (congestive heart failure) (HCC) 09/02/2017   CAD (coronary artery disease)    a. 04/2015 low risk MV;  b. 12/2016 Cath: minor irregs in LAD/Diag/LCX/OM, RCA  40p/m/d; c. 05/2020 Cath: LM nl, LAD 50d, LCX 30p, OM1 40, RCA 100p w/ L->R collats. CO/CI 3.1/1.3-->Med rx.   Chest wall pain, chronic    Chronic Troponin Elevation    CKD (chronic kidney disease), stage II-III    COPD (chronic obstructive pulmonary disease) (HCC)    Diabetes mellitus without complication (HCC)    HFrEF (heart failure with reduced ejection fraction) (HCC)    a. 03/2015 Echo: EF 45-50%; b. 12/2015 Echo: EF 20-25%; c. 02/2016 Echo: EF 30-35%; d. 11/2016 Echo: EF 40-45%; e. 06/2019 Echo: EF 30-35%; f. 11/2019 Echo: EF 25-30%; g. 05/2020 Echo: EF 35-40%; h. 05/2021 Echo: EF 40-45%; i. 11/2021 Echo: EF 20-25%, glob HK, mild LVH, GrIII DD, sev red RV fxn, mild MR.   Hypertension    Mitral regurgitation    Mild to moderate by October 2021 echocardiogram.   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 03/2015 Echo: EF 45-50%; b. 12/2015 Echo: EF 20-25%; c. 02/2016 Echo: EF 30-35%; d. 11/2016 Echo: EF 40-45%; e. 06/2019 Echo: EF 30-35%; f. 11/2019 Echo: EF 25-30%; g. 05/2020 Echo: EF 35-40%; h. 05/2021 Echo: EF 40-45%; i. 11/2021 Echo: EF 20-25%   Myocardial infarct (HCC)    NSVT (nonsustained ventricular tachycardia) (HCC)    a. 12/2015 noted on tele-->amio;  b. 12/2015 Event monitor: no VT noted.   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  12/31/19: RUL and RLL PEs.   Syncope    a. 01/2016 - felt to be vasovagal.    Current Outpatient Medications  Medication Sig Dispense Refill   Accu-Chek Softclix Lancets lancets USE UP TO FOUR TIMES DAILY     albuterol (VENTOLIN HFA) 108 (90 Base) MCG/ACT inhaler Inhale 2 puffs into the lungs every 4 (four) hours as needed for wheezing or shortness of breath. 8 g 6   allopurinol (ZYLOPRIM) 300 MG tablet Take 300 mg by mouth daily.     azelastine (ASTELIN) 0.1 % nasal spray Place 2 sprays into both nostrils 2 (two) times daily. Use in each nostril as directed 30 mL  12   BD PEN NEEDLE NANO 2ND GEN 32G X 4 MM MISC USE  AS DIRECTED AT BEDTIME 200 each 0   blood glucose meter kit and supplies KIT Dispense based on patient and insurance preference. Use up to four times daily as directed. 1 each 11   buPROPion (WELLBUTRIN XL) 150 MG 24 hr tablet Take 1 tablet (150 mg total) by mouth daily. 90 tablet 3   carvedilol (COREG) 6.25 MG tablet Take 2 tablets (12.5 mg total) by mouth 2 (two) times daily. Increased from 6.25 mg 2 times daily. 120 tablet 0   cetirizine (ZYRTEC) 10 MG tablet Take 1 tablet (10 mg total) by mouth daily. 30 tablet 11   colchicine 0.6 MG tablet Day 1: Take 2 tablets PO for gout flare, repeat 1 tablet one hour following initial dose. Day 2: Take 1 tablet twice daily PO for an additional 2-3 days. 60 tablet 0   Continuous Glucose Sensor (FREESTYLE LIBRE 3 SENSOR) MISC PLACE 1 SENSOR ON TO THE SKIN EVERY 14 DAYS. USE TO CHECK BLOOD SUGAR CONTINUEOUSLY. 6 each 0   cyclobenzaprine (FLEXERIL) 5 MG tablet Take 1 tablet (5 mg total) by mouth as needed. 10 tablet 0   dapagliflozin propanediol (FARXIGA) 10 MG TABS tablet Take 1 tablet (10 mg total) by mouth daily. 30 tablet 5   diazepam (VALIUM) 5 MG tablet Take 1 tablet (5 mg total) by mouth 2 (two) times daily as needed (vertigo). 60 tablet 5   fenofibrate (TRICOR) 145 MG tablet Take 1 tablet (145 mg total) by mouth daily. 90 tablet 3   FLUoxetine (PROZAC) 20 MG capsule Take 1 capsule (20 mg total) by mouth daily. 90 capsule 3   fluticasone (FLONASE) 50 MCG/ACT nasal spray Place 2 sprays into both nostrils daily as needed. Home med.     icosapent Ethyl (VASCEPA) 1 g capsule Take 2 capsules (2 g total) by mouth 2 (two) times daily. 360 capsule 1   insulin aspart (NOVOLOG) 100 UNIT/ML FlexPen Inject 10 Units into the skin 3 (three) times daily with meals. Inject 10 units + correction before breakfast/lunch and supper (correction = 1 unit per 50 mg/dL above 130); max daily dose 36 units 15 mL 3   Insulin  Glargine (BASAGLAR KWIKPEN) 100 UNIT/ML Inject 18 Units into the skin at bedtime. Reduced from 38 units. 15 mL 3   insulin glargine-yfgn (SEMGLEE) 100 UNIT/ML injection Inject into the skin.     Insulin Pen Needle 32G X 4 MM MISC 1 each by Does not apply route in the morning, at noon, in the evening, and at bedtime. 120 each 11   meclizine (ANTIVERT) 25 MG tablet Take 1 tablet (25 mg total) by mouth 3 (three) times daily as needed for dizziness. 60 tablet 1   metoCLOPramide (REGLAN)  5 MG tablet Take 1 tablet (5 mg total) by mouth every 8 (eight) hours as needed for nausea. 30 tablet 0   naltrexone (DEPADE) 50 MG tablet Take 1 tablet (50 mg total) by mouth daily. 30 tablet 2   omeprazole (PRILOSEC) 40 MG capsule Take 1 capsule by mouth once daily 90 capsule 0   potassium chloride SA (KLOR-CON M) 20 MEQ tablet Take 1 tablet (20 mEq total) by mouth every other day. 15 tablet 6   predniSONE (DELTASONE) 20 MG tablet Take 1 tablet (20 mg total) by mouth daily. 10 tablet 0   rivaroxaban (XARELTO) 20 MG TABS tablet Take 1 tablet (20 mg total) by mouth daily with supper. 90 tablet 3   rosuvastatin (CRESTOR) 40 MG tablet Take 1 tablet (40 mg total) by mouth daily. 90 tablet 3   sacubitril-valsartan (ENTRESTO) 97-103 MG Hold until followup with cardiology due to acute kidney injury.     spironolactone (ALDACTONE) 25 MG tablet Hold until followup with cardiology due to acute kidney injury.     TALTZ 80 MG/ML SOAJ Inject 3 mLs into the skin every 14 (fourteen) days.     tirzepatide [facility]) 5 MG/0.5ML Pen Inject 5 mg into the skin once a week. 2 mL 0   torsemide (DEMADEX) 20 MG tablet Take 2 tablets (40 mg total) by mouth daily. 180 tablet 3   umeclidinium-vilanterol (ANORO ELLIPTA) 62.5-25 MCG/ACT AEPB Inhale 1 puff into the lungs daily. 30 each 12   nitroGLYCERIN (NITROSTAT) 0.4 MG SL tablet Place 1 tablet (0.4 mg total) under the tongue every 5 (five) minutes x 3 doses as needed for chest pain. (Patient not  taking: Reported on 05/06/2023) 30 tablet 0   No current facility-administered medications for this visit.    Allergies  Allergen Reactions   Metformin And Related Nausea And Vomiting   Zantac [Ranitidine Hcl] Diarrhea and Nausea Only    Night sweats      Social History   Socioeconomic History   Marital status: Widowed    Spouse name: Not on file   Number of children: 1   Years of education: 81   Highest education level: 12th grade  Occupational History   Occupation: disabled  Tobacco Use   Smoking status: Former    Current packs/day: 0.00    Average packs/day: 0.5 packs/day for 33.0 years (16.5 ttl pk-yrs)    Types: Cigarettes    Start date: 05/09/1987    Quit date: 05/08/2020    Years since quitting: 2.9   Smokeless tobacco: Former    Quit date: 05/08/2020   Tobacco comments:    Quit Sept 2021  Vaping Use   Vaping status: Former  Substance and Sexual Activity   Alcohol use: Not Currently    Comment: occassionally   Drug use: No   Sexual activity: Not Currently  Other Topics Concern   Not on file  Social History Narrative   Lives at home with wife and his dad's wife.        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    Social Determinants of Health   Financial Resource Strain: Medium Risk (08/05/2022)   Overall Financial Resource Strain (CARDIA)    Difficulty of Paying Living Expenses: Somewhat hard  Food Insecurity: No Food Insecurity (03/08/2023)   Hunger Vital Sign    Worried About Running Out of Food in the Last Year: Never true    Ran Out of  Food in the Last Year: Never true  Transportation Needs: No Transportation Needs (03/08/2023)   PRAPARE - Administrator, Civil Service (Medical): No    Lack of Transportation (Non-Medical): No  Physical Activity: Insufficiently Active (01/20/2018)   Exercise Vital Sign    Days of Exercise per Week: 7 days    Minutes of Exercise per Session: 10  min  Stress: Stress Concern Present (09/02/2017)   [name] of Occupational Health - Occupational Stress Questionnaire    Feeling of Stress : Very much  Social Connections: Moderately Isolated (09/02/2017)   Social Connection and Isolation Panel [NHANES]    Frequency of Communication with Friends and Family: Twice a week    Frequency of Social Gatherings with Friends and Family: Once a week    Attends Religious Services: Never    Database administrator or Organizations: No    Attends Banker Meetings: Never    Marital Status: Widowed  Intimate Partner Violence: Not At Risk (03/08/2023)   Humiliation, Afraid, Rape, and Kick questionnaire    Fear of Current or Ex-Partner: No    Emotionally Abused: No    Physically Abused: No    Sexually Abused: No      Family History  Problem Relation Age of Onset   Diabetes Mother    Diabetes Mellitus II Mother    Hypothyroidism Mother    Hypertension Mother    Kidney failure Mother        Dialysis   Heart attack Mother        68 yo approximately   Hypertension Father    Gout Father    Cancer Maternal Grandfather    Diabetes Maternal Grandfather    Cancer Paternal Aunt     Vitals:   05/06/23 0933  BP: (!) 151/83  Pulse: 86  SpO2: 96%  Weight: 250 lb (113.4 kg)   Wt Readings from Last 3 Encounters:  05/06/23 250 lb (113.4 kg)  04/21/23 245 lb 9.5 oz (111.4 kg)  04/16/23 245 lb 9.6 oz (111.4 kg)     PHYSICAL EXAM: General:  Well appearing. No resp difficulty HEENT: normal Neck: supple. no JVD. Carotids 2+ bilat; no bruits. No lymphadenopathy or thryomegaly appreciated. Cor: PMI nondisplaced. Regular rate & rhythm. No rubs, gallops or murmurs. Lungs: clear Abdomen: obese soft, nontender, nondistended. No hepatosplenomegaly. No bruits or masses. Good bowel sounds. Extremities: no cyanosis, clubbing, rash, edema Neuro: alert & orientedx3, cranial nerves grossly intact. moves all 4 extremities w/o difficulty.  Affect pleasant. Cranial nerves grossly intact. moves all 4 extremities w/o difficulty. Affect pleasant   ASSESSMENT & PLAN:   1.  Chronic HFrEF/Mixed Ischemic and nonischemic cardiomyopathy:   - Mixed ICM/NICM - 10/22 where EF was 40-45%.  - Echo 02/06/22: EF 30-35%  - Echo 08/14/22 EF 40-45% RV ok  - Echo 7/24 EF 45-50% RV ok  - Stable NYHA III likely multifactorial  - s/p [place] Sci Sq-ICD  - Volume status looks good. ReDS (has been falsely elevated) - Continue torsemide 40 daily. - Continue Entresto to 97/103 bid - Continue Farxiga 10 - Continue spiro 25 daily - Continue carvedilol 12.5 bid  2.  CAD/Elevated Troponin and chronic CP:  - H/o CAD w/ last cath in 10/201 revealing an occluded RCA w/ otw nonobs dzs and L  R collats.  - Continue ASA/statin - Stable angina - Follows with Dr. Mariah Milling    3.  HTN:  - Blood pressure high today but says usually  well controlled at home. Wants to defer med titration today  4.  H/o PE/Chronic anticoagulation:  -On Xarelto.  - No recent bleeding   5.  CKD IIIa  - recent bloodwork reviewed and OK. SCr 1.36 and K 3.7 - continue SGLT2i/Entresto   6. DM2 -  per PCP - glucose control much improved.  7. OSA - has not been on CPAP due to lack of insurance. Now on Medicaid - Sleep study 3/22: AHI 28  - Sleep study 12/23 AHI 45.1 -> needs f/u with Pulmonary  Arvilla Meres, MD  9:39 AM

## 2023-05-06 NOTE — Patient Instructions (Signed)
Medication Changes:  None, continue current medications  Special Instructions // Education:  Do the following things EVERYDAY: Weigh yourself in the morning before breakfast. Write it down and keep it in a log. Take your medicines as prescribed Eat low salt foods--Limit salt (sodium) to 2000 mg per day.  Stay as active as you can everyday Limit all fluids for the day to less than 2 liters   Follow-Up in: 1 year (Sept 2025), we will call you closer to that time to schedule    If you have any questions or concerns before your next appointment please send [place] a message through [place] or call our office at [phone] Monday-Friday 8 am-5 pm.   If you have an urgent need after hours on the weekend please call your Primary Cardiologist or the [facility] in [place] at [phone].

## 2023-05-07 ENCOUNTER — Other Ambulatory Visit: Payer: Self-pay

## 2023-05-07 ENCOUNTER — Other Ambulatory Visit: Payer: Medicaid Other | Admitting: Pharmacist

## 2023-05-07 ENCOUNTER — Other Ambulatory Visit: Payer: Self-pay | Admitting: Family Medicine

## 2023-05-07 DIAGNOSIS — E0822 Diabetes mellitus due to underlying condition with diabetic chronic kidney disease: Secondary | ICD-10-CM

## 2023-05-07 MED ORDER — FREESTYLE LIBRE 3 SENSOR MISC
1.0000 | 0 refills | Status: DC
  Filled 2023-05-07: qty 6, 84d supply, fill #0

## 2023-05-07 MED ORDER — FREESTYLE LIBRE 3 PLUS SENSOR MISC: 1.0000 | 0 refills | Status: DC

## 2023-05-07 NOTE — Patient Instructions (Addendum)
Please follow up with [facility] regarding picking up your [facility] [place] 3 sensors.  [facility] [address] [facility] [address] [place], [place], [place] [address] [phone]  We will attempt to reach you by telephone again on 05/08/2023 at 11:00 AM.  Thank you!  Estelle Grumbles, PharmD, [facility] [phone]

## 2023-05-07 NOTE — Progress Notes (Signed)
05/07/2023  Patient ID: [name]., male   DOB: December 04, 1970, 52 y.o.   MRN: [id number]  Outreach to patient today by telephone as scheduled.  Patient requests assistance with obtaining his Freestyle [place] sensors. Reports needs to change his sensor tomorrow, but was advised by his [facility] that the [employer] 3 sensors are not in stock.   Outreach to [employer] today on behalf of patient. Speak with [facility] who advises that [facility] 3 sensors are on back order, but that pharmacy is able to obtain the [employer] 3 Plus sensors. - Per protocol, send prescription for Freestyle Libre 3 Plus sensors to [employer] for pharmacy to try running through patient's Medicaid plan. However, per [facility], claim rejected by insurance as product not on formulary/not covered.   Contact local pharmacies on behalf of patient and find [facility] has the [facility] 3 sensors in stock. Patient requests to have prescription for sensors filled at [facility]. Speak with Madelaine Bhat at [facility] who confirms pharmacy will contact patient's [facility] to transfer and fill prescription for [facility] 3 sensors for patient today.  Patient requests to reschedule the remainder of our appointment as he is not feeling well/has a migraine this morning. Patient confirms will contact provider office if needed regarding migraine.   Follow Up Plan: Reschedule appointment with patient as requested.  Estelle Grumbles, PharmD, [facility] [phone]

## 2023-05-08 ENCOUNTER — Other Ambulatory Visit: Payer: Self-pay

## 2023-05-08 ENCOUNTER — Other Ambulatory Visit: Payer: Medicaid Other | Admitting: Pharmacist

## 2023-05-08 NOTE — Progress Notes (Addendum)
05/08/2023 Name: Gabriel [name]. MRN: [id number] DOB: [birth year]  Chief Complaint  Patient presents with   Diabetes   Hypertension    Gabriel [name]. is a 52 y.o. year old male who presented for a telephone visit.   They were referred to the pharmacist by their PCP for assistance in managing diabetes.    Subjective:  Care Team: Primary Care Provider: Jacky Kindle, FNP ; Next Scheduled Visit: 07/16/23 Clinical pharmacist: Marlowe Aschoff, PharmD  Medication Access/Adherence  Current Pharmacy:  [facility] [address] ([address]),  - [address] [place]) [place] [address] Phone: [phone] Fax: [phone]  [facility] - [place], [place] - [address] [place] [address] Phone: [phone] Fax: [phone]   Patient reports affordability concerns with their medications: No  Patient reports access/transportation concerns to their pharmacy: No  Patient reports adherence concerns with their medications:  No     Diabetes:  Current medications: Farxiga 10mg , Ozempic 2mg  weekly, Basaglar 18U at bedtime, Novolog 10 U with meals + correction dose; NOT using PAP Medications tried in the past: Metformin (N/V), Lantus, Levemir (discontinued), Trulicity (trial to Ozempic to see if nausea resolves)   Date of Download: 05/08/23 See attached document    Patient denies hypoglycemic s/sx including dizziness, shakiness, sweating. Patient denies hyperglycemic symptoms including polyuria, polydipsia, polyphagia, nocturia, neuropathy, blurred vision.  Current meal patterns:  - Eats 1-2 meals a day only and alternates between the same foods - Unsure if afternoon BG is looking higher than usual due to GI upset or migraine recently   Objective:  Lab Results  Component Value Date   HGBA1C 6.4 (A) 04/16/2023    Lab Results  Component Value Date    CREATININE 1.36 (H) 04/23/2023   BUN 30 (H) 04/23/2023   NA 140 04/23/2023   K 3.7 04/23/2023   CL 103 04/23/2023   CO2 27 04/23/2023    Lab Results  Component Value Date   CHOL 134 04/16/2023   HDL 24 (L) 08/09/2022   LDLCALC 62 08/09/2022   LDLDIRECT 60.6 11/23/2020   TRIG 339 (H) 04/16/2023   CHOLHDL 6.0 (H) 08/09/2022    Medications Reviewed Today     Reviewed by Pollie Friar, RPH (Pharmacist) on 05/08/23 at 1206  Med List Status: <None>   Medication Order Taking? Sig Documenting Provider Last Dose Status Informant  Accu-Chek Softclix Lancets lancets [id number] Yes USE UP TO FOUR TIMES DAILY [provider] Taking Active   albuterol (VENTOLIN HFA) 108 (90 Base) MCG/ACT inhaler [id number] Yes Inhale 2 puffs into the lungs every 4 (four) hours as needed for wheezing or shortness of breath. Raechel Chute, MD Taking Active   allopurinol (ZYLOPRIM) 300 MG tablet [id number] Yes Take 300 mg by mouth daily. [provider] Taking Active   azelastine (ASTELIN) 0.1 % nasal spray [id number] Yes Place 2 sprays into both nostrils 2 (two) times daily. Use in each nostril as directed Jacky Kindle, FNP Taking Active   BD PEN NEEDLE NANO 2ND GEN 32G X 4 MM MISC [id number] Yes USE  AS DIRECTED AT BEDTIME Jacky Kindle, FNP Taking Active   blood glucose meter kit and supplies KIT [id number] Yes Dispense based on patient and insurance preference. Use up to four times daily as directed. Jacky Kindle, FNP Taking Active   buPROPion (WELLBUTRIN XL) 150 MG 24 hr tablet [id number] No  Take 1 tablet (150 mg total) by mouth daily.  Patient not taking: Reported on 05/08/2023   Jacky Kindle, FNP Unknown Active   carvedilol (COREG) 6.25 MG tablet [id number] Yes Take 2 tablets (12.5 mg total) by mouth 2 (two) times daily. Increased from 6.25 mg 2 times daily. Darlin Priestly, MD Taking Active   cetirizine (ZYRTEC) 10 MG tablet [id number] Yes Take 1 tablet (10 mg total) by mouth daily. Jacky Kindle, FNP Taking Active   colchicine 0.6 MG tablet [id number] Yes Day 1: Take 2 tablets PO for gout flare, repeat 1 tablet one hour following initial dose. Day 2: Take 1 tablet twice daily PO for an additional 2-3 days. Jacky Kindle, FNP Taking Active   Continuous Glucose Sensor (FREESTYLE LIBRE 3 SENSOR) [place] [id number] Yes PLACE 1 SENSOR ON TO THE SKIN EVERY 14 DAYS. USE TO CHECK BLOOD SUGAR CONTINUEOUSLY. [provider] Taking Active   Continuous Glucose Sensor (FREESTYLE LIBRE 3 SENSOR) [place] [id number] Yes 1 Application by Does not apply route every 14 (fourteen) days. Use to check blood sugar continuously.  Taking Active   cyclobenzaprine (FLEXERIL) 5 MG tablet [id number] No Take 1 tablet (5 mg total) by mouth as needed.  Patient not taking: Reported on 05/08/2023   Kern Reap A, PA-C Not Taking Active   dapagliflozin propanediol (FARXIGA) 10 MG TABS tablet [id number] Yes Take 1 tablet (10 mg total) by mouth daily. Delma Freeze, FNP Taking Active   diazepam (VALIUM) 5 MG tablet [id number] Yes Take 1 tablet (5 mg total) by mouth 2 (two) times daily as needed (vertigo). Jacky Kindle, FNP Taking Active   fenofibrate (TRICOR) 145 MG tablet [id number] Yes Take 1 tablet (145 mg total) by mouth daily. Delma Freeze, FNP Taking Active   FLUoxetine (PROZAC) 20 MG capsule [id number] Yes Take 1 capsule (20 mg total) by mouth daily. Jacky Kindle, FNP Taking Active   fluticasone [facility]) 50 MCG/ACT nasal spray [id number] Yes Place 2 sprays into both nostrils daily as needed. Home med. Darlin Priestly, MD Taking Active   icosapent Ethyl (VASCEPA) 1 g capsule [id number] Yes Take 2 capsules (2 g total) by mouth 2 (two) times daily. Jacky Kindle, FNP Taking Active   insulin aspart (NOVOLOG) 100 UNIT/ML FlexPen [id number] Yes Inject 10 Units into the skin 3 (three) times daily with meals. Inject 10 units + correction before breakfast/lunch and supper (correction = 1 unit per 50 mg/dL above 762); max daily  dose 36 units Merita Norton T, FNP Taking Active   Insulin Glargine (BASAGLAR KWIKPEN) 100 UNIT/ML [id number] Yes Inject 18 Units into the skin at bedtime. Reduced from 38 units. Jacky Kindle, FNP Taking Active   insulin glargine-yfgn (SEMGLEE) 100 UNIT/ML injection [id number] No Inject into the skin.  Patient not taking: Reported on 05/08/2023   [provider] Not Taking Active     Discontinued 01/21/22 1612 (Change in therapy)            Med Note Raynald Kemp Feb 14, 2022  2:37 PM)    Insulin Pen Needle 32G X 4 MM MISC [id number] Yes 1 each by Does not apply route in the morning, at noon, in the evening, and at bedtime. Jacky Kindle, FNP Taking Active   meclizine (ANTIVERT) 25 MG tablet [id number] Yes Take 1 tablet (25 mg total) by mouth 3 (three) times daily as needed for dizziness. Antonieta Iba, MD Taking Active   metoCLOPramide [facility])  5 MG tablet [id number] Yes Take 1 tablet (5 mg total) by mouth every 8 (eight) hours as needed for nausea. Toney Reil, MD Taking Active   naltrexone (DEPADE) 50 MG tablet [id number] No Take 1 tablet (50 mg total) by mouth daily.  Patient not taking: Reported on 05/08/2023   Jacky Kindle, FNP Not Taking Active   nitroGLYCERIN (NITROSTAT) 0.4 MG SL tablet [id number] Yes Place 1 tablet (0.4 mg total) under the tongue every 5 (five) minutes x 3 doses as needed for chest pain. Jacky Kindle, FNP Taking Active   omeprazole (PRILOSEC) 40 MG capsule [id number] Yes Take 1 capsule by mouth once daily Vanga, [name], MD Taking Active   Peppermint Oil (IBGARD PO) [id number] Yes Take 1 tablet by mouth daily. [provider] Taking Active Self  potassium chloride SA (KLOR-CON M) 20 MEQ tablet [id number] Yes Take 1 tablet (20 mEq total) by mouth every other day. Bensimhon, Bevelyn Buckles, MD Taking Active   predniSONE (DELTASONE) 20 MG tablet [id number] No Take 1 tablet (20 mg total) by mouth daily.  Patient not taking: Reported on  05/08/2023   Kern Reap A, PA-C Not Taking Active   rivaroxaban (XARELTO) 20 MG TABS tablet [id number] Yes Take 1 tablet (20 mg total) by mouth daily with supper. Graciella Freer, PA-C Taking Active            Med Note Clearance Coots, Alanda Slim May 30, 2022  1:36 PM)    rosuvastatin (CRESTOR) 40 MG tablet [id number] Yes Take 1 tablet (40 mg total) by mouth daily. Antonieta Iba, MD Taking Active   sacubitril-valsartan [facility]) 97-103 MG [id number] Yes Hold until followup with cardiology due to acute kidney injury.  Patient taking differently: Take 1 tablet by mouth 2 (two) times daily. Hold until followup with cardiology due to acute kidney injury.   Darlin Priestly, MD Taking Active   Semaglutide, 2 MG/DOSE, (OZEMPIC, 2 MG/DOSE,) 8 MG/3ML SOPN [id number] Yes Inject 2 mg into the skin once a week. [provider] Taking Active Self  spironolactone (ALDACTONE) 25 MG tablet [id number] Yes Hold until followup with cardiology due to acute kidney injury.  Patient taking differently: Take 25 mg by mouth daily. Hold until followup with cardiology due to acute kidney injury.   Darlin Priestly, MD Taking Active   TALTZ 80 MG/ML [place] [id number] Yes Inject 3 mLs into the skin every 14 (fourteen) days. [provider] Taking Active            Med Note Lorenso Courier, Angelica Chessman May 08, 2023 11:29 AM) Monthly currently  tirzepatide [facility]) 5 MG/0.5ML Pen [id number] No Inject 5 mg into the skin once a week.  Patient not taking: Reported on 05/08/2023   Jacky Kindle, FNP Not Taking Active   torsemide (DEMADEX) 20 MG tablet [id number] Yes Take 2 tablets (40 mg total) by mouth daily. Delma Freeze, FNP Taking Active   umeclidinium-vilanterol (ANORO ELLIPTA) 62.5-25 MCG/ACT AEPB [id number] Yes Inhale 1 puff into the lungs daily. Raechel Chute, MD Taking Active               Assessment/Plan:   Diabetes: - Currently controlled - Reviewed long term cardiovascular and renal outcomes of  uncontrolled blood sugar - Reviewed goal A1c, goal fasting, and goal 2 hour post prandial glucose - Reviewed lifestyle modifications including: eating more consistent timeframes  Hypertension:  - Currently controlled - BP was 138/70 while on the phone today -  Reports usually lower in 120-130's/70's at home, but has had migraine for last 3 days (first one in several years)   **Follow Up Plan:**  - Scheduled for 3 month follow-up with Gentry Fitz on 07/30/23 - Continue medications as prescribed - PCP reports it is okay with patient continuing Ozempic currently due to low cost- if A1c increases, will change to [facility] in the future *Of note, picked up Wellbutrin earlier this month but could not find it during call- marked as Unknown   Length of visit: 48 minutes  Marlowe Aschoff, PharmD [facility] Phone Number: [phone]

## 2023-05-08 NOTE — Telephone Encounter (Signed)
Requested medication (s) are due for refill today:   Requested medication (s) are on the active medication list: Yes  Last refill:  01/27/23  Future visit scheduled:   Notes to clinic:  Chart indicates historical provider.    Requested Prescriptions  Pending Prescriptions Disp Refills   OZEMPIC, 2 MG/DOSE, 8 MG/3ML SOPN [Pharmacy Med Name: Ozempic (2 MG/DOSE) 8 MG/3ML Subcutaneous Solution Pen-injector] 9 mL 0    Sig: INJECT 2MG  SUBCUTANEOUSLY ONCE A WEEK     Endocrinology:  Diabetes - GLP-1 Receptor Agonists - semaglutide Failed - 05/07/2023  3:15 PM      Failed - HBA1C in normal range and within 180 days    Hemoglobin A1C  Date Value Ref Range Status  04/16/2023 6.4 (A) 4.0 - 5.6 % Final   Hgb A1c MFr Bld  Date Value Ref Range Status  01/17/2023 6.6 (H) 4.8 - 5.6 % Final    Comment:    (NOTE)         Prediabetes: 5.7 - 6.4         Diabetes: >6.4         Glycemic control for adults with diabetes: <7.0          Failed - Cr in normal range and within 360 days    Creatinine  Date Value Ref Range Status  10/07/2013 0.77 0.60 - 1.30 mg/dL Final   Creatinine, Ser  Date Value Ref Range Status  04/23/2023 1.36 (H) 0.61 - 1.24 mg/dL Final         Passed - Valid encounter within last 6 months    Recent Outpatient Visits           3 weeks ago AKI (acute kidney injury) [facility])   [facility] Merita Norton T, FNP   1 month ago Type 2 diabetes mellitus with hyperlipidemia [facility])   [facility] Merita Norton T, FNP   4 months ago Seasonal allergic rhinitis due to pollen   [facility] Merita Norton T, FNP   7 months ago Atypical chest pain   [facility] Merita Norton T, FNP   9 months ago Chronic HFrEF (heart failure with reduced ejection fraction) [facility])   [facility] Jacky Kindle, FNP       Future Appointments             In 6 days Raechel Chute, MD [facility] [place]   In 2 months Jacky Kindle, FNP [facility]   In 2 months Deirdre Evener, MD [facility]   In 3 months Furth, Cadence H, PA-C [facility] [place]

## 2023-05-09 DIAGNOSIS — M25512 Pain in left shoulder: Secondary | ICD-10-CM | POA: Diagnosis not present

## 2023-05-09 DIAGNOSIS — G8929 Other chronic pain: Secondary | ICD-10-CM | POA: Diagnosis not present

## 2023-05-14 ENCOUNTER — Telehealth: Payer: Self-pay

## 2023-05-14 ENCOUNTER — Ambulatory Visit: Payer: Medicaid Other | Admitting: Student in an Organized Health Care Education/Training Program

## 2023-05-14 ENCOUNTER — Encounter: Payer: Self-pay | Admitting: Student in an Organized Health Care Education/Training Program

## 2023-05-14 VITALS — BP 126/80 | HR 95 | Temp 98.1°F | Ht 71.0 in | Wt 240.0 lb

## 2023-05-14 DIAGNOSIS — I2699 Other pulmonary embolism without acute cor pulmonale: Secondary | ICD-10-CM

## 2023-05-14 DIAGNOSIS — G4733 Obstructive sleep apnea (adult) (pediatric): Secondary | ICD-10-CM | POA: Diagnosis not present

## 2023-05-14 DIAGNOSIS — R0602 Shortness of breath: Secondary | ICD-10-CM | POA: Diagnosis not present

## 2023-05-14 DIAGNOSIS — I10 Essential (primary) hypertension: Secondary | ICD-10-CM | POA: Diagnosis not present

## 2023-05-14 NOTE — Telephone Encounter (Signed)
Patient left a voicemail on main line at 9:28am he states he started the Rezdiffra and his GI symptoms are a lot worse since starting the medication. He states that his diarrhea and nausea, some abdominal pain. The diarrhea is 3 to 4 times a day. Denies any vomiting any blood in stool. The pain is lower abdominal area all the way across when he has to have a bowel movement. Patient started the medication started on 05/08/2023 and is still taking it because did not want to stop the medication with out you telling him to. He states he called the pharmacy and these are common side effects to the medication but he states he was having this before starting the medication but his symptoms have got worse since starting.

## 2023-05-14 NOTE — Progress Notes (Signed)
Assessment & Plan:   #Shortness of breath   Presents for follow up on shortness of breath that is multi-factorial. He has a significant cardiac history (multi-vessel CAD, HFrEF, last EF 30%), PE (Diagnosed 12/2019 on Rivaroxaban) and emphysema on imaging (history of smoking).   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 He does have a history of psoriasis (? 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 Given history of VTE, I ordered a perfusion study which returned normal ruling out CTEPH.   PFT's showed a response to bronchodilators but overall spirometry was normal, as were lung volumes. DLCO was decreased at 70% predicted. We initiated LABA/LAMA therapy which the patient felt some improvement with. While he continues to be short of breath, his symptoms are overall manageable. He will continue with Anoro Ellipta for now.  -Continue Anoro Ellipta (Umeclidinium-Vilanterol) one puff once daily.   #OSA on CPAP   Noted to have OSA and initiated on BiPAP. Compliance report showed poor compliance. Patient continues to have problems tolerating the BiPAP. Discussed with Mr. Hartshorne at length that any backup rate would be contraindicated given his history of HFrEF. I will refer him to a sleep specialist for consultation and co-management of his complex sleep disorder.   Return in about 1 year (around 05/13/2024).  I spent 30 minutes caring for this patient today, including preparing to see the patient, obtaining a medical history , reviewing a separately obtained history, performing a medically appropriate examination and/or evaluation, counseling and educating the patient/family/caregiver, and documenting clinical information in the electronic health record  Raechel Chute, MD [facility] 05/14/2023 9:18 AM    End of visit medications:  No orders of the defined types were placed in this encounter.    Current Outpatient Medications:    Accu-Chek Softclix Lancets lancets, USE UP TO FOUR TIMES DAILY, Disp: , Rfl:    albuterol (VENTOLIN HFA) 108 (90 Base) MCG/ACT inhaler, Inhale 2 puffs into the lungs every 4 (four) hours as needed for wheezing or shortness of breath., Disp: 8 g, Rfl: 6   allopurinol (ZYLOPRIM) 300 MG tablet, Take 300 mg by mouth daily., Disp: , Rfl:    azelastine (ASTELIN) 0.1 % nasal spray, Place 2 sprays into both nostrils 2 (two) times daily. Use in each nostril as directed, Disp: 30 mL, Rfl: 12   BD PEN NEEDLE NANO 2ND GEN 32G X 4 MM MISC, USE  AS DIRECTED AT BEDTIME, Disp: 200 each, Rfl: 0   blood glucose meter kit and supplies KIT, Dispense based on patient and insurance preference. Use up to four times daily as directed., Disp: 1 each, Rfl: 11   buPROPion (WELLBUTRIN XL) 150 MG 24 hr tablet, Take 1 tablet (150 mg total) by mouth daily., Disp: 90 tablet, Rfl: 3   carvedilol (COREG) 6.25 MG tablet, Take 2 tablets (12.5 mg total) by mouth 2 (two) times daily. Increased from 6.25 mg 2 times daily., Disp: 120 tablet, Rfl: 0   cetirizine (ZYRTEC) 10 MG tablet, Take 1 tablet (10 mg total) by mouth daily., Disp: 30 tablet, Rfl: 11   colchicine 0.6 MG tablet, Day 1: Take 2 tablets PO for gout flare, repeat 1 tablet one hour following initial dose. Day 2: Take 1 tablet twice daily PO for an additional 2-3 days.,  Disp: 60 tablet, Rfl: 0   Continuous Glucose Sensor (FREESTYLE LIBRE 3 SENSOR) MISC, PLACE 1 SENSOR ON TO THE SKIN EVERY 14 DAYS. USE TO CHECK BLOOD SUGAR CONTINUEOUSLY., Disp: , Rfl:    Continuous Glucose Sensor (FREESTYLE LIBRE 3 SENSOR) MISC, 1 Application by Does not apply route every 14 (fourteen) days. Use to check blood sugar continuously., Disp: 6 each, Rfl: 0   cyclobenzaprine (FLEXERIL) 5 MG tablet, Take 1 tablet (5 mg total) by mouth as  needed., Disp: 10 tablet, Rfl: 0   dapagliflozin propanediol (FARXIGA) 10 MG TABS tablet, Take 1 tablet (10 mg total) by mouth daily., Disp: 30 tablet, Rfl: 5   diazepam (VALIUM) 5 MG tablet, Take 1 tablet (5 mg total) by mouth 2 (two) times daily as needed (vertigo)., Disp: 60 tablet, Rfl: 5   fenofibrate (TRICOR) 145 MG tablet, Take 1 tablet (145 mg total) by mouth daily., Disp: 90 tablet, Rfl: 3   FLUoxetine (PROZAC) 20 MG capsule, Take 1 capsule (20 mg total) by mouth daily., Disp: 90 capsule, Rfl: 3   fluticasone (FLONASE) 50 MCG/ACT nasal spray, Place 2 sprays into both nostrils daily as needed. Home med., Disp: , Rfl:    icosapent Ethyl (VASCEPA) 1 g capsule, Take 2 capsules (2 g total) by mouth 2 (two) times daily., Disp: 360 capsule, Rfl: 1   insulin aspart (NOVOLOG) 100 UNIT/ML FlexPen, Inject 10 Units into the skin 3 (three) times daily with meals. Inject 10 units + correction before breakfast/lunch and supper (correction = 1 unit per 50 mg/dL above 956); max daily dose 36 units, Disp: 15 mL, Rfl: 3   Insulin Glargine (BASAGLAR KWIKPEN) 100 UNIT/ML, Inject 18 Units into the skin at bedtime. Reduced from 38 units., Disp: 15 mL, Rfl: 3   insulin glargine-yfgn (SEMGLEE) 100 UNIT/ML injection, Inject into the skin., Disp: , Rfl:    Insulin Pen Needle 32G X 4 MM MISC, 1 each by Does not apply route in the morning, at noon, in the evening, and at bedtime., Disp: 120 each, Rfl: 11   meclizine (ANTIVERT) 25 MG tablet, Take 1 tablet (25 mg total) by mouth 3 (three) times daily as needed for dizziness., Disp: 60 tablet, Rfl: 1   metoCLOPramide (REGLAN) 5 MG tablet, Take 1 tablet (5 mg total) by mouth every 8 (eight) hours as needed for nausea., Disp: 30 tablet, Rfl: 0   naltrexone (DEPADE) 50 MG tablet, Take 1 tablet (50 mg total) by mouth daily., Disp: 30 tablet, Rfl: 2   nitroGLYCERIN (NITROSTAT) 0.4 MG SL tablet, Place 1 tablet (0.4 mg total) under the tongue every 5 (five) minutes x 3 doses as  needed for chest pain., Disp: 30 tablet, Rfl: 0   omeprazole (PRILOSEC) 40 MG capsule, Take 1 capsule by mouth once daily, Disp: 90 capsule, Rfl: 0   OZEMPIC, 2 MG/DOSE, 8 MG/3ML SOPN, INJECT 2MG  SUBCUTANEOUSLY ONCE A WEEK, Disp: 9 mL, Rfl: 0   Peppermint Oil (IBGARD PO), Take 1 tablet by mouth daily., Disp: , Rfl:    potassium chloride SA (KLOR-CON M) 20 MEQ tablet, Take 1 tablet (20 mEq total) by mouth every other day., Disp: 15 tablet, Rfl: 6   predniSONE (DELTASONE) 20 MG tablet, Take 1 tablet (20 mg total) by mouth daily., Disp: 10 tablet, Rfl: 0   REZDIFFRA 100 MG TABS, Take by mouth., Disp: , Rfl:    rivaroxaban (XARELTO) 20 MG TABS tablet, Take 1 tablet (20 mg total) by mouth daily with supper., Disp: 90 tablet,  Rfl: 3   rosuvastatin (CRESTOR) 40 MG tablet, Take 1 tablet (40 mg total) by mouth daily., Disp: 90 tablet, Rfl: 3   sacubitril-valsartan (ENTRESTO) 97-103 MG, Hold until followup with cardiology due to acute kidney injury. (Patient taking differently: Take 1 tablet by mouth 2 (two) times daily. Hold until followup with cardiology due to acute kidney injury.), Disp: , Rfl:    spironolactone (ALDACTONE) 25 MG tablet, Hold until followup with cardiology due to acute kidney injury. (Patient taking differently: Take 25 mg by mouth daily. Hold until followup with cardiology due to acute kidney injury.), Disp: , Rfl:    TALTZ 80 MG/ML SOAJ, Inject 3 mLs into the skin every 14 (fourteen) days., Disp: , Rfl:    torsemide (DEMADEX) 20 MG tablet, Take 2 tablets (40 mg total) by mouth daily., Disp: 180 tablet, Rfl: 3   umeclidinium-vilanterol (ANORO ELLIPTA) 62.5-25 MCG/ACT AEPB, Inhale 1 puff into the lungs daily., Disp: 30 each, Rfl: 12   Subjective:   PATIENT ID: Gabriel [name]. GENDER: male DOB: [birth year], MRN: [id number]  Chief Complaint  Patient presents with   Follow-up    Shortness of breath with exertion and occasional at rest. No cough or wheezing. Chest tightness x 2 days.     HPI  Mr. Shute is a 52 year old male patient with a past medical history most notable for CAD, heart failure, diabetes, hypertension, PE on Xarelto, and OSA who is presenting to clinic for follow up.  He continues to have symptoms of shortness of breath with exertion as well as occasional chest tightness. He's been followed closely in coordination with cardiology. During his last visit, we initiated Anoro Ellipta given response to bronchodilator, though without diagnosis of COPD.  Today, Mr. Assenmacher is reporting multiple problems with his BiPAP machine. He has been having multiple symptoms with it. He is waking up multiple times a night and feels like he is suffocating. He is concerned that he would not be able to breath with the mask on. During a previous visit, we reviewed a sleep study he had (split night with titration) and we started him on BiPAP (Auto-IPAP, EPAP of 12, no back up rate). AHI had improved to 10 with therapy during a previous visit and review of use.  He was previously diagnosed with OSA with a sleep study in 2021 - at that time he had an AHI of 100 requiring a split night sleep study with titration. At that time, he required BiPAP pressures of 26/22 to improve his AHI. He has underwent multiple CT scans of the chest with the report noting emphysema.  Patient is not on any inhalers but is on multiple medications for the management of his cardiac condition.   Patient reports that he has been short of breath with exertion since 2016 when he had his heart attack.  Over the past 8 years he has been experiencing exertional dyspnea with minimal activity. He reports an occasional cough that is dry and nonproductive.  He sometimes feels chest tightness but denies any wheezing.  He has had multiple hospitalizations for decompensations of his heart failure. He has been closely followed by cardiology and is on an aggressive regimen for the management of heart failure as well as on Rivaroxaban  for pulmonary embolism.     He had a RHC in October of 2021 while hospitalized for heart failure showing RA 9, RV 57/10, PA 57/28 (40), PCWP 17, CO 3.1 and CI of 1.3. PVR calculated at 7.4 [place].  He does have a history of smoking and reports having smoked at least 2 packs a day for over 40 years. 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  Ancillary information including prior medications, full medical/surgical/family/social histories, and PFTs (when available) are listed below and have been reviewed.   Review of Systems  Constitutional:  Negative for chills, fever and weight loss.  Respiratory:  Positive for shortness of breath. Negative for cough, hemoptysis, sputum production and wheezing.   Cardiovascular:  Negative for chest pain.  Skin:  Negative for rash.     Objective:   Vitals:   05/14/23 0848  BP: 126/80  Pulse: 95  Temp: 98.1 F (36.7 C)  TempSrc: Temporal  SpO2: 96%  Weight: 240 lb (108.9 kg)  Height: 5\' 11"  (1.803 m)   96% on RA  BMI Readings from Last 3 Encounters:  05/14/23 33.47 kg/m  05/06/23 34.87 kg/m  04/21/23 34.25 kg/m   Wt Readings from Last 3 Encounters:  05/14/23 240 lb (108.9 kg)  05/06/23 250 lb (113.4 kg)  04/21/23 245 lb 9.5 oz (111.4 kg)    Physical Exam Constitutional:      Appearance: He is obese. He is not ill-appearing.  HENT:     Nose: Nose normal.     Mouth/Throat:     Mouth: Mucous membranes are moist.  Neck:     Comments: Thick neck, facial hair Cardiovascular:     Rate and Rhythm: Normal rate and regular rhythm.     Pulses: Normal pulses.     Heart sounds: Normal heart sounds.  Pulmonary:     Effort: Pulmonary effort is normal.     Breath sounds: No wheezing or rales (faint bibasilar).  Abdominal:     General: There is distension.     Palpations: Abdomen is soft.  Neurological:     General: No focal deficit present.     Mental Status: He is  alert and oriented to person, place, and time. Mental status is at baseline.       Ancillary Information    Past Medical History:  Diagnosis Date   Acute on chronic combined systolic and diastolic CHF (congestive heart failure) (HCC) 09/02/2017   CAD (coronary artery disease)    a. 04/2015 low risk MV;  b. 12/2016 Cath: minor irregs in LAD/Diag/LCX/OM, RCA 40p/m/d; c. 05/2020 Cath: LM nl, LAD 50d, LCX 30p, OM1 40, RCA 100p w/ L->R collats. CO/CI 3.1/1.3-->Med rx.   Chest wall pain, chronic    Chronic Troponin Elevation    CKD (chronic kidney disease), stage II-III    COPD (chronic obstructive pulmonary disease) (HCC)    Diabetes mellitus without complication (HCC)    HFrEF (heart failure with reduced ejection fraction) (HCC)    a. 03/2015 Echo: EF 45-50%; b. 12/2015 Echo: EF 20-25%; c. 02/2016 Echo: EF 30-35%; d. 11/2016 Echo: EF 40-45%; e. 06/2019 Echo: EF 30-35%; f. 11/2019 Echo: EF 25-30%; g. 05/2020 Echo: EF 35-40%; h. 05/2021 Echo: EF 40-45%; i. 11/2021 Echo: EF 20-25%, glob HK, mild LVH, GrIII DD, sev red RV fxn, mild MR.   Hypertension    Mitral regurgitation    Mild to moderate by October 2021 echocardiogram.   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 03/2015 Echo: EF 45-50%; b. 12/2015 Echo: EF 20-25%; c. 02/2016 Echo: EF 30-35%; d. 11/2016 Echo: EF 40-45%; e. 06/2019 Echo: EF 30-35%; f. 11/2019 Echo: EF 25-30%; g. 05/2020  Echo: EF 35-40%; h. 05/2021 Echo: EF 40-45%; i. 11/2021 Echo: EF 20-25%   Myocardial infarct (HCC)    NSVT (nonsustained ventricular tachycardia) (HCC)    a. 12/2015 noted on tele-->amio;  b. 12/2015 Event monitor: no VT noted.   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  12/31/19: RUL and RLL PEs.   Syncope    a. 01/2016 - felt to be vasovagal.     Family History  Problem Relation Age of Onset   Diabetes Mother    Diabetes Mellitus II Mother    Hypothyroidism Mother     Hypertension Mother    Kidney failure Mother        Dialysis   Heart attack Mother        3 yo approximately   Hypertension Father    Gout Father    Cancer Maternal Grandfather    Diabetes Maternal Grandfather    Cancer Paternal Aunt      Past Surgical History:  Procedure Laterality Date   AMPUTATION     CARDIAC CATHETERIZATION     COLONOSCOPY WITH PROPOFOL N/A 08/01/2022   Procedure: COLONOSCOPY WITH PROPOFOL;  Surgeon: Toney Reil, MD;  Location: [facility] ENDOSCOPY;  Service: Gastroenterology;  Laterality: N/A;   ESOPHAGOGASTRODUODENOSCOPY (EGD) WITH PROPOFOL N/A 08/01/2022   Procedure: ESOPHAGOGASTRODUODENOSCOPY (EGD) WITH PROPOFOL;  Surgeon: Toney Reil, MD;  Location: [facility] ENDOSCOPY;  Service: Gastroenterology;  Laterality: N/A;   ESOPHAGOGASTRODUODENOSCOPY (EGD) WITH PROPOFOL N/A 10/16/2022   Procedure: ESOPHAGOGASTRODUODENOSCOPY (EGD) WITH PROPOFOL;  Surgeon: Toney Reil, MD;  Location: [facility] ENDOSCOPY;  Service: Gastroenterology;  Laterality: N/A;   FINGER AMPUTATION     Traumatic   FINGER FRACTURE SURGERY Left    FRACTURE SURGERY     LEFT HEART CATH AND CORONARY ANGIOGRAPHY N/A 01/06/2017   Procedure: Left Heart Cath and Coronary Angiography;  Surgeon: Iran Ouch, MD;  Location: [facility] INVASIVE CV LAB;  Service: Cardiovascular;  Laterality: N/A;   RIGHT/LEFT HEART CATH AND CORONARY ANGIOGRAPHY N/A 06/13/2020   Procedure: RIGHT/LEFT HEART CATH AND CORONARY ANGIOGRAPHY;  Surgeon: Yvonne Kendall, MD;  Location: [facility] INVASIVE CV LAB;  Service: Cardiovascular;  Laterality: N/A;   SUBQ ICD IMPLANT N/A 05/20/2022   Procedure: SUBQ ICD IMPLANT;  Surgeon: Lanier Prude, MD;  Location: [facility] INVASIVE CV LAB;  Service: Cardiovascular;  Laterality: N/A;    Social History   Socioeconomic History   Marital status: Widowed    Spouse name: Not on file   Number of children: 1   Years of education: 69   Highest education level: 12th grade  Occupational  History   Occupation: disabled  Tobacco Use   Smoking status: Former    Current packs/day: 0.00    Average packs/day: 0.5 packs/day for 33.0 years (16.5 ttl pk-yrs)    Types: Cigarettes    Start date: 05/09/1987    Quit date: 05/08/2020    Years since quitting: 3.0   Smokeless tobacco: Former    Quit date: 05/08/2020   Tobacco comments:    Quit Sept 2021  Vaping Use   Vaping status: Former  Substance and Sexual Activity   Alcohol use: Not Currently    Comment: occassionally   Drug use: No   Sexual activity: Not Currently  Other Topics Concern   Not on file  Social History Narrative   Lives at home with wife and his dad's wife.  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    Social Determinants of Health   Financial Resource Strain: Medium Risk (08/05/2022)   Overall Financial Resource Strain (CARDIA)    Difficulty of Paying Living Expenses: Somewhat hard  Food Insecurity: No Food Insecurity (03/08/2023)   Hunger Vital Sign    Worried About Running Out of Food in the Last Year: Never true    Ran Out of Food in the Last Year: Never true  Transportation Needs: No Transportation Needs (03/08/2023)   PRAPARE - Administrator, Civil Service (Medical): No    Lack of Transportation (Non-Medical): No  Physical Activity: Insufficiently Active (01/20/2018)   Exercise Vital Sign    Days of Exercise per Week: 7 days    Minutes of Exercise per Session: 10 min  Stress: Stress Concern Present (09/02/2017)   [name] of Occupational Health - Occupational Stress Questionnaire    Feeling of Stress : Very much  Social Connections: Moderately Isolated (09/02/2017)   Social Connection and Isolation Panel [NHANES]    Frequency of Communication with Friends and Family: Twice a week    Frequency of Social Gatherings with Friends and Family: Once a week    Attends Religious Services: Never    Doctor, general practice or Organizations: No    Attends Banker Meetings: Never    Marital Status: Widowed  Intimate Partner Violence: Not At Risk (03/08/2023)   Humiliation, Afraid, Rape, and Kick questionnaire    Fear of Current or Ex-Partner: No    Emotionally Abused: No    Physically Abused: No    Sexually Abused: No     Allergies  Allergen Reactions   Metformin And Related Nausea And Vomiting   Zantac [Ranitidine Hcl] Diarrhea and Nausea Only    Night sweats     CBC    Component Value Date/Time   WBC 7.7 03/09/2023 0413   RBC 5.51 03/09/2023 0413   HGB 15.4 03/09/2023 0413   HGB 17.0 11/05/2021 1403   HCT 45.9 03/09/2023 0413   HCT 51.9 (H) 11/05/2021 1403   PLT 158 03/09/2023 0413   PLT 170 11/05/2021 1403   MCV 83.3 03/09/2023 0413   MCV 86 11/05/2021 1403   MCV 85 10/07/2013 1703   MCH 27.9 03/09/2023 0413   MCHC 33.6 03/09/2023 0413   RDW 14.3 03/09/2023 0413   RDW 14.3 11/05/2021 1403   RDW 14.6 (H) 10/07/2013 1703   LYMPHSABS 2.9 12/21/2022 0859   LYMPHSABS 1.8 11/05/2021 1403   LYMPHSABS 2.3 08/26/2012 1200   MONOABS 0.6 12/21/2022 0859   MONOABS 0.9 08/26/2012 1200   EOSABS 0.2 12/21/2022 0859   EOSABS 0.1 11/05/2021 1403   EOSABS 0.1 08/26/2012 1200   BASOSABS 0.1 12/21/2022 0859   BASOSABS 0.1 11/05/2021 1403   BASOSABS 0.1 08/26/2012 1200    Pulmonary Functions Testing Results:    Latest Ref Rng & Units 10/17/2022    8:14 AM  PFT Results  FVC-Pre L 4.38   FVC-Predicted Pre % 85   FVC-Post L 4.50   FVC-Predicted Post % 87   Pre FEV1/FVC % % 83   Post FEV1/FCV % % 85   FEV1-Pre L 3.61   FEV1-Predicted Pre % 91   FEV1-Post L 3.83   DLCO uncorrected ml/min/mmHg 20.99   DLCO UNC% % 70   DLVA Predicted % 69   TLC L 6.58   TLC % Predicted %  91   RV % Predicted % 87     Outpatient Medications Prior to Visit  Medication Sig Dispense Refill   Accu-Chek Softclix Lancets lancets USE UP TO FOUR TIMES DAILY     albuterol (VENTOLIN HFA) 108 (90  Base) MCG/ACT inhaler Inhale 2 puffs into the lungs every 4 (four) hours as needed for wheezing or shortness of breath. 8 g 6   allopurinol (ZYLOPRIM) 300 MG tablet Take 300 mg by mouth daily.     azelastine (ASTELIN) 0.1 % nasal spray Place 2 sprays into both nostrils 2 (two) times daily. Use in each nostril as directed 30 mL 12   BD PEN NEEDLE NANO 2ND GEN 32G X 4 MM MISC USE  AS DIRECTED AT BEDTIME 200 each 0   blood glucose meter kit and supplies KIT Dispense based on patient and insurance preference. Use up to four times daily as directed. 1 each 11   buPROPion (WELLBUTRIN XL) 150 MG 24 hr tablet Take 1 tablet (150 mg total) by mouth daily. 90 tablet 3   carvedilol (COREG) 6.25 MG tablet Take 2 tablets (12.5 mg total) by mouth 2 (two) times daily. Increased from 6.25 mg 2 times daily. 120 tablet 0   cetirizine (ZYRTEC) 10 MG tablet Take 1 tablet (10 mg total) by mouth daily. 30 tablet 11   colchicine 0.6 MG tablet Day 1: Take 2 tablets PO for gout flare, repeat 1 tablet one hour following initial dose. Day 2: Take 1 tablet twice daily PO for an additional 2-3 days. 60 tablet 0   Continuous Glucose Sensor (FREESTYLE LIBRE 3 SENSOR) MISC PLACE 1 SENSOR ON TO THE SKIN EVERY 14 DAYS. USE TO CHECK BLOOD SUGAR CONTINUEOUSLY.     Continuous Glucose Sensor (FREESTYLE LIBRE 3 SENSOR) MISC 1 Application by Does not apply route every 14 (fourteen) days. Use to check blood sugar continuously. 6 each 0   cyclobenzaprine (FLEXERIL) 5 MG tablet Take 1 tablet (5 mg total) by mouth as needed. 10 tablet 0   dapagliflozin propanediol (FARXIGA) 10 MG TABS tablet Take 1 tablet (10 mg total) by mouth daily. 30 tablet 5   diazepam (VALIUM) 5 MG tablet Take 1 tablet (5 mg total) by mouth 2 (two) times daily as needed (vertigo). 60 tablet 5   fenofibrate (TRICOR) 145 MG tablet Take 1 tablet (145 mg total) by mouth daily. 90 tablet 3   FLUoxetine (PROZAC) 20 MG capsule Take 1 capsule (20 mg total) by mouth daily. 90  capsule 3   fluticasone (FLONASE) 50 MCG/ACT nasal spray Place 2 sprays into both nostrils daily as needed. Home med.     icosapent Ethyl (VASCEPA) 1 g capsule Take 2 capsules (2 g total) by mouth 2 (two) times daily. 360 capsule 1   insulin aspart (NOVOLOG) 100 UNIT/ML FlexPen Inject 10 Units into the skin 3 (three) times daily with meals. Inject 10 units + correction before breakfast/lunch and supper (correction = 1 unit per 50 mg/dL above 161); max daily dose 36 units 15 mL 3   Insulin Glargine (BASAGLAR KWIKPEN) 100 UNIT/ML Inject 18 Units into the skin at bedtime. Reduced from 38 units. 15 mL 3   insulin glargine-yfgn (SEMGLEE) 100 UNIT/ML injection Inject into the skin.     Insulin Pen Needle 32G X 4 MM MISC 1 each by Does not apply route in the morning, at noon, in the evening, and at bedtime. 120 each 11   meclizine (ANTIVERT) 25 MG tablet Take 1 tablet (25  mg total) by mouth 3 (three) times daily as needed for dizziness. 60 tablet 1   metoCLOPramide (REGLAN) 5 MG tablet Take 1 tablet (5 mg total) by mouth every 8 (eight) hours as needed for nausea. 30 tablet 0   naltrexone (DEPADE) 50 MG tablet Take 1 tablet (50 mg total) by mouth daily. 30 tablet 2   nitroGLYCERIN (NITROSTAT) 0.4 MG SL tablet Place 1 tablet (0.4 mg total) under the tongue every 5 (five) minutes x 3 doses as needed for chest pain. 30 tablet 0   omeprazole (PRILOSEC) 40 MG capsule Take 1 capsule by mouth once daily 90 capsule 0   OZEMPIC, 2 MG/DOSE, 8 MG/3ML SOPN INJECT 2MG  SUBCUTANEOUSLY ONCE A WEEK 9 mL 0   Peppermint Oil (IBGARD PO) Take 1 tablet by mouth daily.     potassium chloride SA (KLOR-CON M) 20 MEQ tablet Take 1 tablet (20 mEq total) by mouth every other day. 15 tablet 6   predniSONE (DELTASONE) 20 MG tablet Take 1 tablet (20 mg total) by mouth daily. 10 tablet 0   REZDIFFRA 100 MG TABS Take by mouth.     rivaroxaban (XARELTO) 20 MG TABS tablet Take 1 tablet (20 mg total) by mouth daily with supper. 90 tablet 3    rosuvastatin (CRESTOR) 40 MG tablet Take 1 tablet (40 mg total) by mouth daily. 90 tablet 3   sacubitril-valsartan (ENTRESTO) 97-103 MG Hold until followup with cardiology due to acute kidney injury. (Patient taking differently: Take 1 tablet by mouth 2 (two) times daily. Hold until followup with cardiology due to acute kidney injury.)     spironolactone (ALDACTONE) 25 MG tablet Hold until followup with cardiology due to acute kidney injury. (Patient taking differently: Take 25 mg by mouth daily. Hold until followup with cardiology due to acute kidney injury.)     TALTZ 80 MG/ML SOAJ Inject 3 mLs into the skin every 14 (fourteen) days.     torsemide (DEMADEX) 20 MG tablet Take 2 tablets (40 mg total) by mouth daily. 180 tablet 3   umeclidinium-vilanterol (ANORO ELLIPTA) 62.5-25 MCG/ACT AEPB Inhale 1 puff into the lungs daily. 30 each 12   No facility-administered medications prior to visit.

## 2023-05-15 ENCOUNTER — Other Ambulatory Visit: Payer: Self-pay | Admitting: Family Medicine

## 2023-05-15 DIAGNOSIS — M1A00X0 Idiopathic chronic gout, unspecified site, without tophus (tophi): Secondary | ICD-10-CM | POA: Diagnosis not present

## 2023-05-15 DIAGNOSIS — L4050 Arthropathic psoriasis, unspecified: Secondary | ICD-10-CM | POA: Diagnosis not present

## 2023-05-15 MED ORDER — DICYCLOMINE HCL 10 MG PO CAPS: 20.0000 mg | ORAL_CAPSULE | Freq: Three times a day (TID) | ORAL | 0 refills | Status: DC

## 2023-05-15 MED ORDER — ROSUVASTATIN CALCIUM 20 MG PO TABS: 20.0000 mg | ORAL_TABLET | Freq: Every day | ORAL | 3 refills | Status: DC

## 2023-05-15 NOTE — Addendum Note (Signed)
Addended by: Radene Knee L on: 05/15/2023 02:31 PM   Modules accepted: Orders

## 2023-05-15 NOTE — Telephone Encounter (Signed)
Also the Drug rep stop by today and he stated that patients on Statins on this medications if they are on Simvastatin or Crestor it needs to be decreased to 20mg  and if it is atorvastatin or Pravastatin decreased to 40mg .Patient is on Crestor 40mg 

## 2023-05-15 NOTE — Telephone Encounter (Signed)
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  He is also on Crestor 40 mg daily.  Recommendation is to decrease Crestor dose to 20 mg daily as long as he is on Rezdiffra.  He is also having some GI side effects.  I just wanted to let you know that we will be calling the patient to reduce the dose to 20 mg daily  Please let me know if you have any other recommendations  Morrie Sheldon, please advise patient to decrease Crestor to 20 mg daily Recommend Bentyl 10 to 20 mg every 8 hours as needed before each meal and at bed time for abdominal cramps and diarrhea   Thank you Cristin Penaflor

## 2023-05-15 NOTE — Telephone Encounter (Signed)
Called patient and informed patient the instructions and he verbalized understanding. He will start with one pill and increased to 2 pills as needed if the 1 pill does not help. He will contact the prescriber of Crestor to decrease the medication

## 2023-05-18 IMAGING — NM NM HEPATOBILIARY IMAGE, INC GB
1 series · 6 of 6 positions shown · non-contrast
Comparison: November 24, 2021 ultrasound abdomen

CLINICAL DATA: RUQ abdominal pain, US nondiagnostic

EXAM:
NUCLEAR MEDICINE HEPATOBILIARY IMAGING
TECHNIQUE: Sequential images of the abdomen were obtained [DATE] minutes
following intravenous administration of radiopharmaceutical.
RADIOPHARMACEUTICALS:  5.46 mCi 0c-BBm  Choletec IV

[Series 1000: hepatobiliary scan · 9.59mm/px · 6 of 60 frames shown]
[frame 6/60]
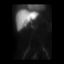
[frame 16/60]
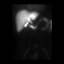
[frame 26/60]
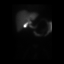
[frame 36/60]
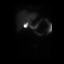
[frame 46/60]
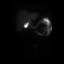
[frame 56/60]
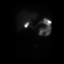

[6 of 6 positions shown; findings below may reference images not displayed]

FINDINGS: Prompt uptake and biliary excretion of activity by the liver is
seen. Gallbladder activity is visualized, consistent with patency of
cystic duct. Biliary activity passes into small bowel, consistent
with patent common bile duct.
IMPRESSION: Patent cystic duct.  Patent common bile duct.

## 2023-05-18 IMAGING — US US ABDOMEN LIMITED
1 series · 15 of 25 positions shown · non-contrast
Comparison: No priors.

CLINICAL DATA: 50-year-old male with history of right upper
quadrant abdominal pain.

EXAM:
ULTRASOUND ABDOMEN LIMITED RIGHT UPPER QUADRANT

[Series 1: us abdomen limited ruq · 15 of 39 slices shown]
[im 1/39]
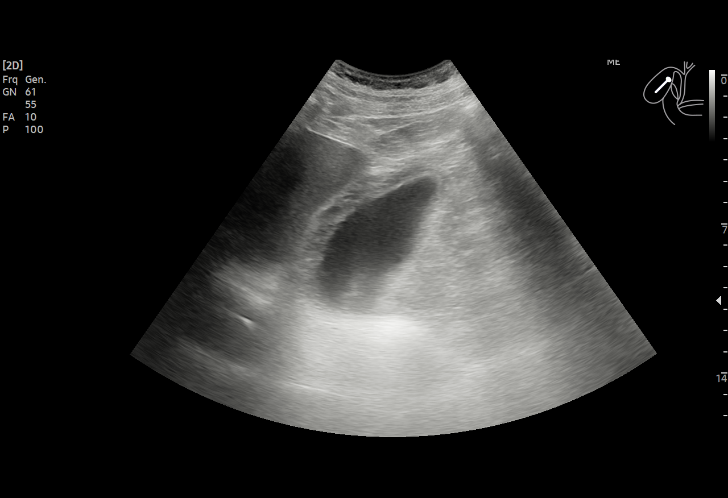
[im 4/39]
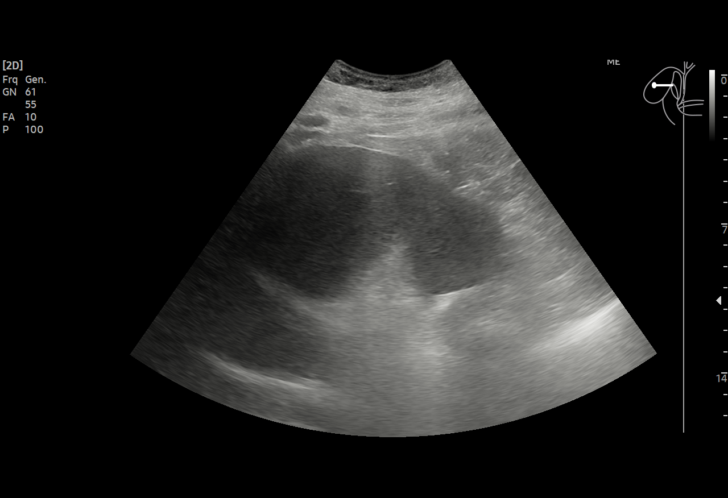
[im 7/39]
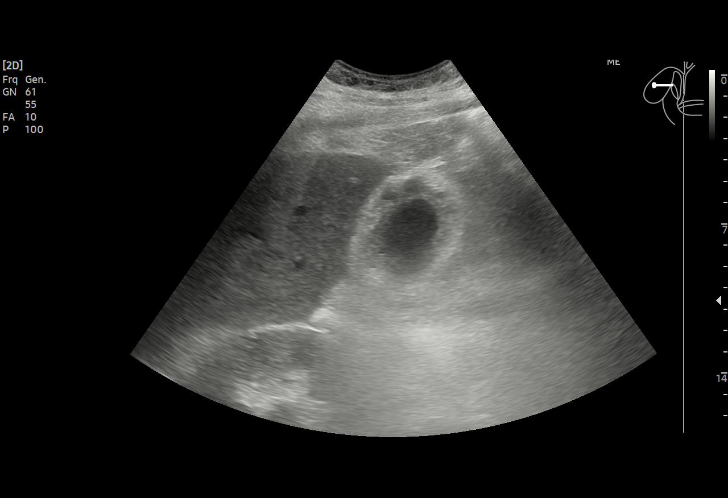
[im 8/39]
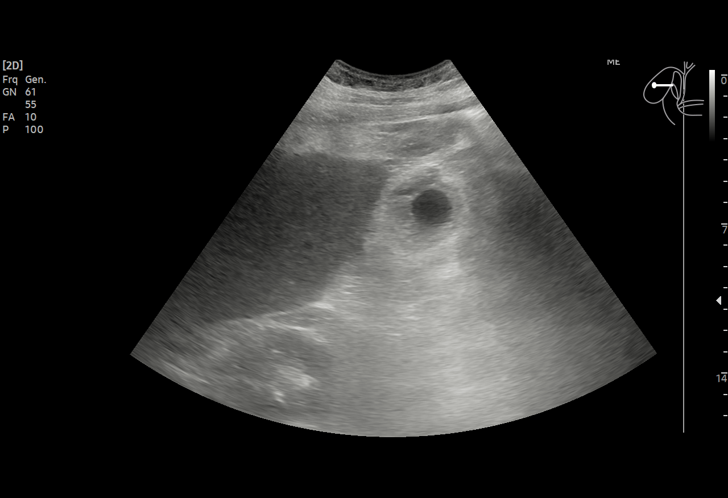
[im 12/39]
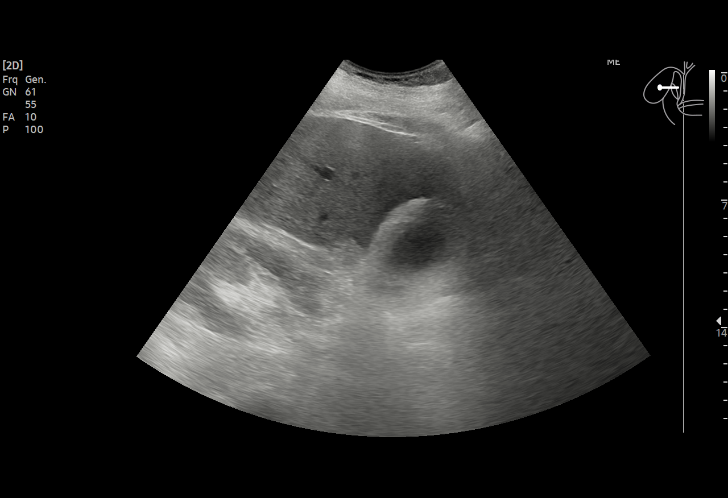
[im 15/39]
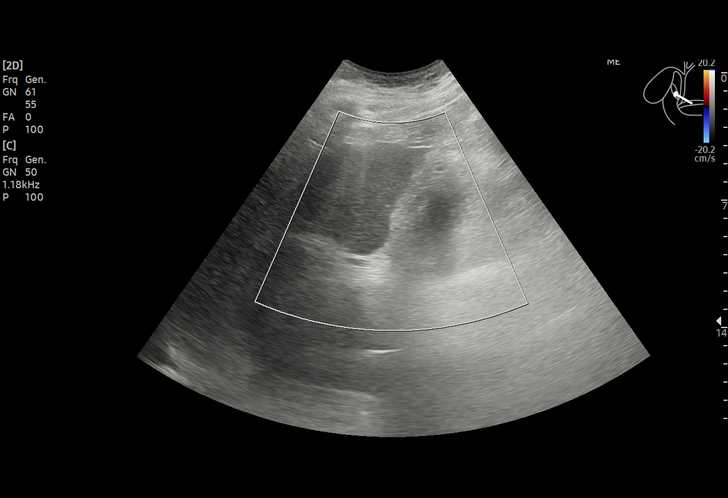
[im 16/39]
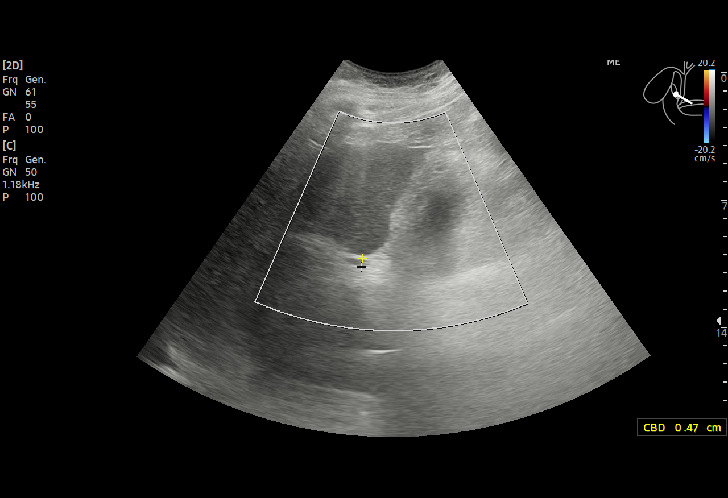
[im 20/39]
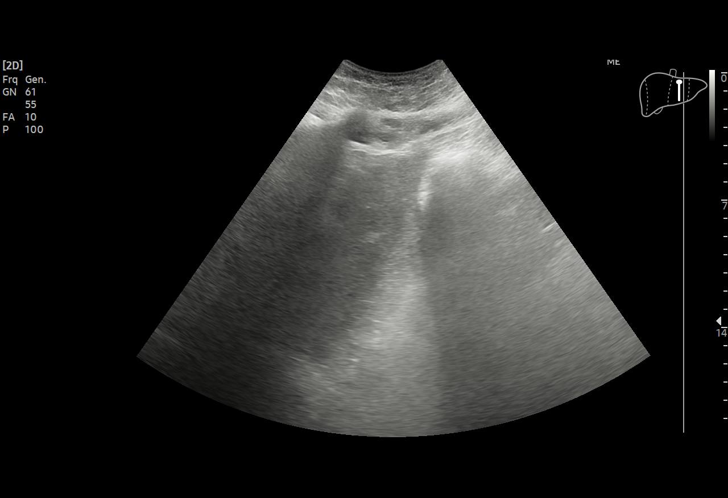
[im 23/39]
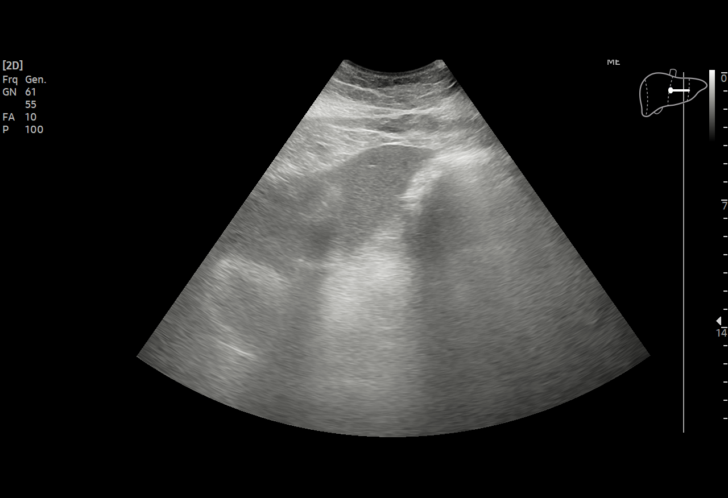
[im 24/39]
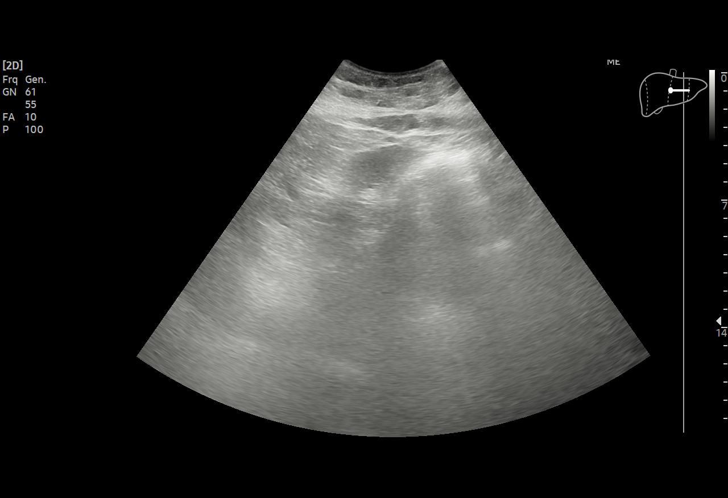
[im 27/39]
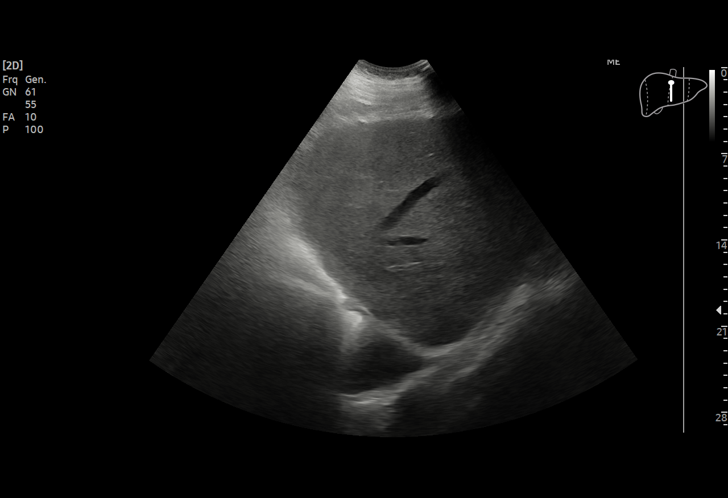
[im 31/39]
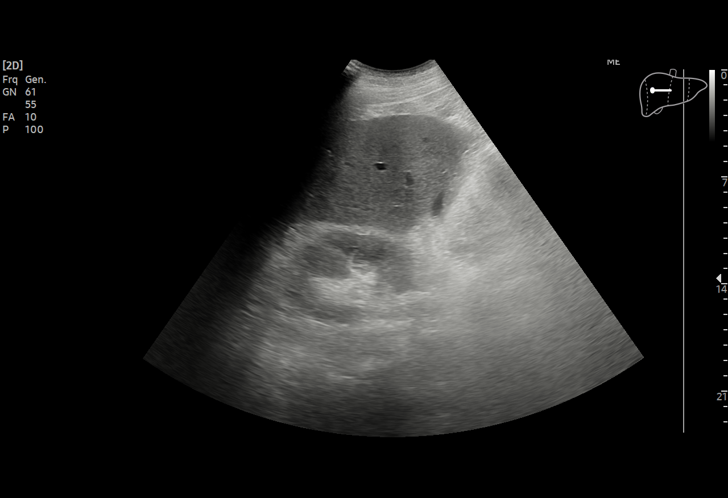
[im 32/39]
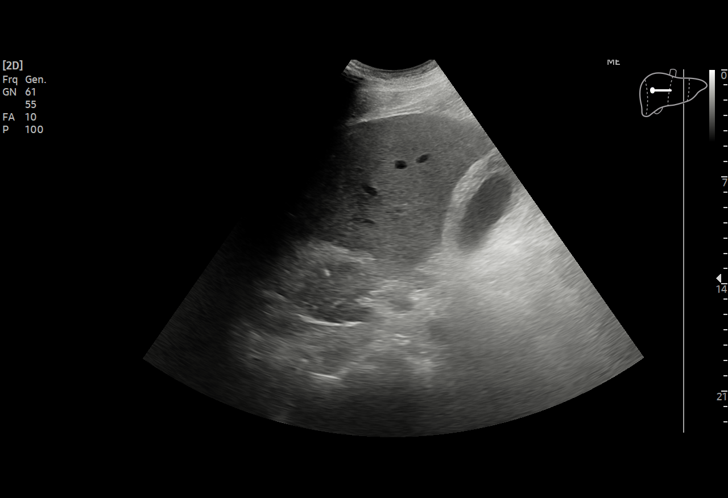
[im 35/39]
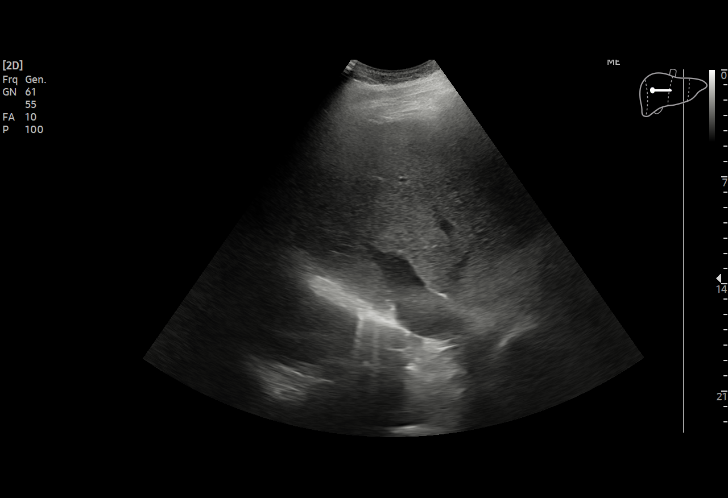
[im 39/39]
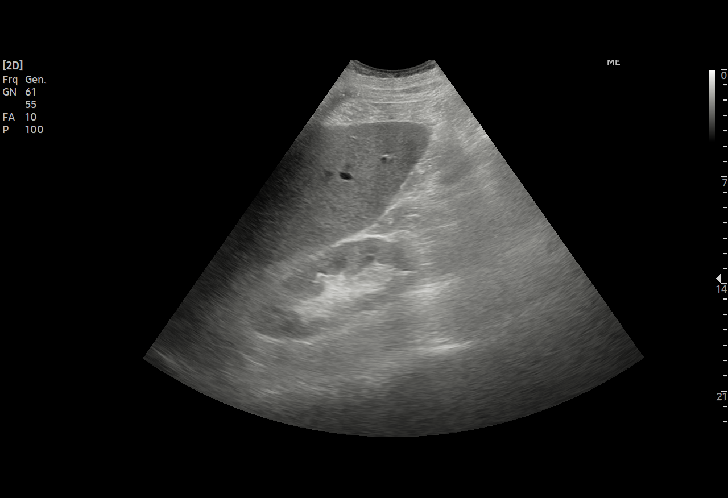

[15 of 25 positions shown; findings below may reference images not displayed]

FINDINGS: Gallbladder:

No definite shadowing gallstones are noted. Gallbladder does not
appear distended. Gallbladder wall is diffusely thickened and
edematous measuring up to 13 mm in thickness. Trace volume of
pericholecystic fluid. Per report from the sonographer, there was a
sonographic Murphy's sign on examination.

Common bile duct:

Diameter: 5 mm

Liver:

Liver has a shrunken appearance and nodular contour, indicative of
underlying cirrhosis. Diffusely increased hepatic echogenicity,
suggestive of hepatic steatosis. No definite suspicious hepatic
lesions are confidently identified. Portal vein is patent on color
Doppler imaging with normal direction of blood flow towards the
liver.

Other: None.
IMPRESSION: 1. Gallbladder wall is thickened and edematous, with positive
sonographic Murphy's sign on examination. No gallstones are noted.
Findings could represent acute acalculous cholecystitis. Surgical
consultation is recommended. Correlation with nuclear medicine
hepatobiliary scan should be considered if clinically appropriate.
2. Hepatic cirrhosis with evidence of hepatic steatosis.

## 2023-05-18 IMAGING — CT CT ANGIO CHEST
2 of 6 series · 18 of 46 positions shown · IV contrast (APPLIED)
Comparison: 08/09/2021.

CLINICAL DATA: Short of breath. Abdominal pain. Bilateral leg pain.
Symptoms for 3-4 days.

EXAM:
CT ANGIOGRAPHY CHEST WITH CONTRAST
TECHNIQUE: Multidetector CT imaging of the chest was performed using the
standard protocol during bolus administration of intravenous
contrast. Multiplanar CT image reconstructions and MIPs were
obtained to evaluate the vascular anatomy.

[Series 6: thins · axial · 0.80mm/px · z∈[-1149,-878]mm · 15 of 426 slices shown]
[im 19/426  lung]
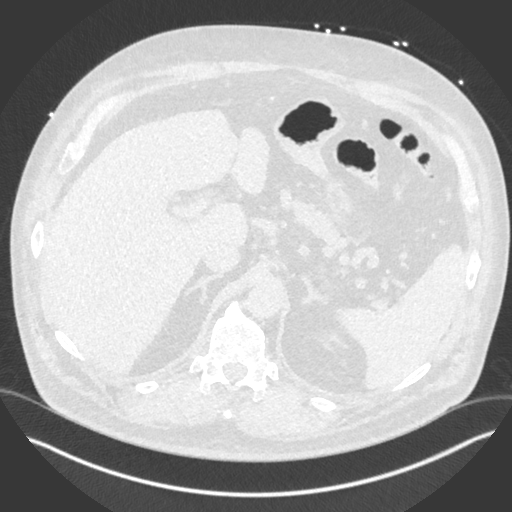
[im 56/426  soft-tissue]
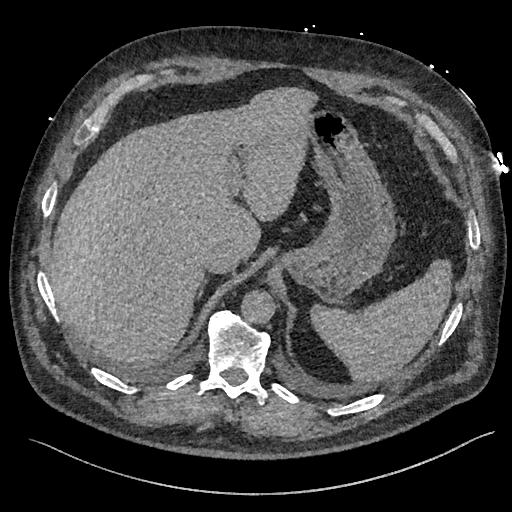
[im 74/426  lung]
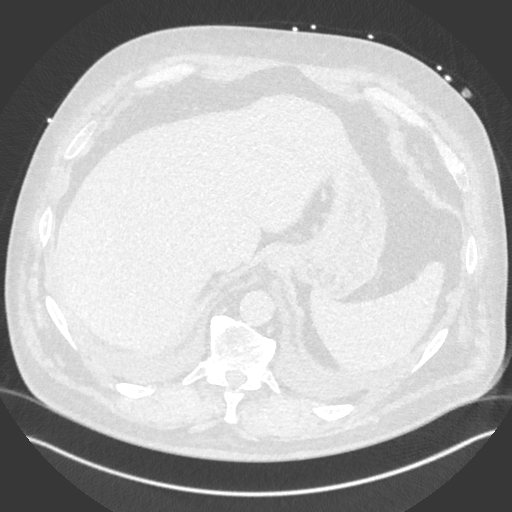
[im 111/426  soft-tissue]
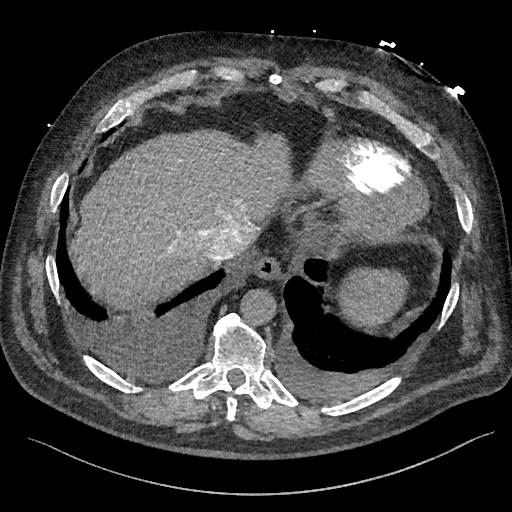
[im 130/426  lung]
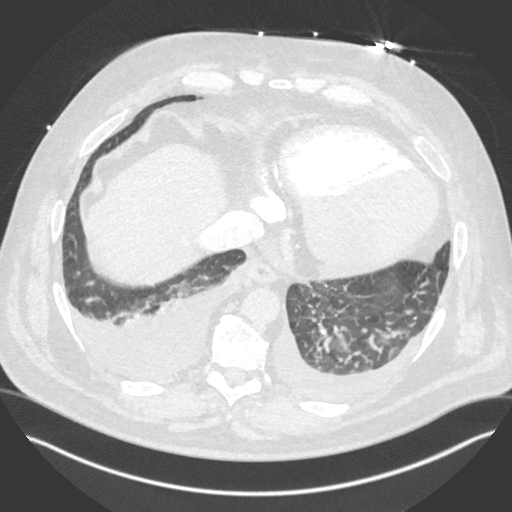
[im 167/426  soft-tissue]
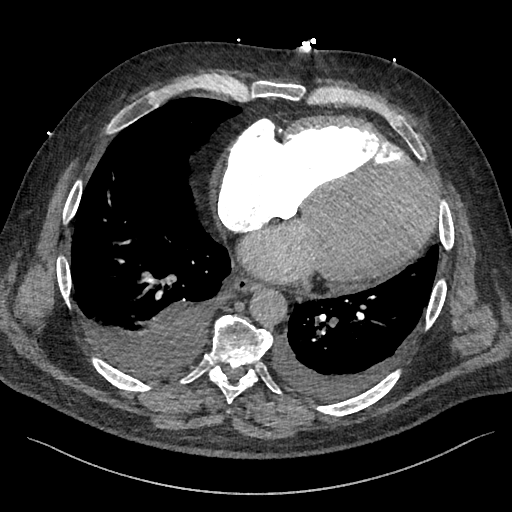
[im 185/426  lung]
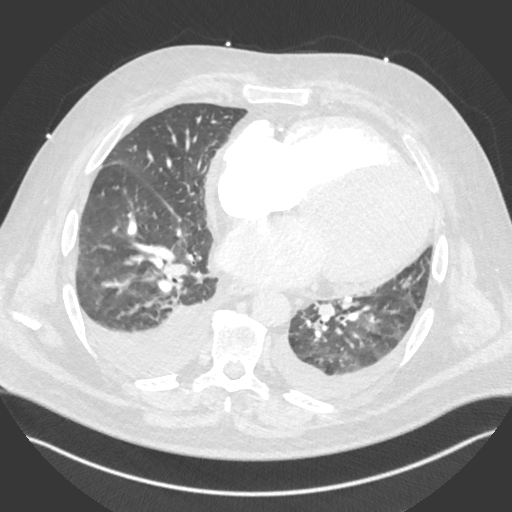
[im 222/426  soft-tissue]
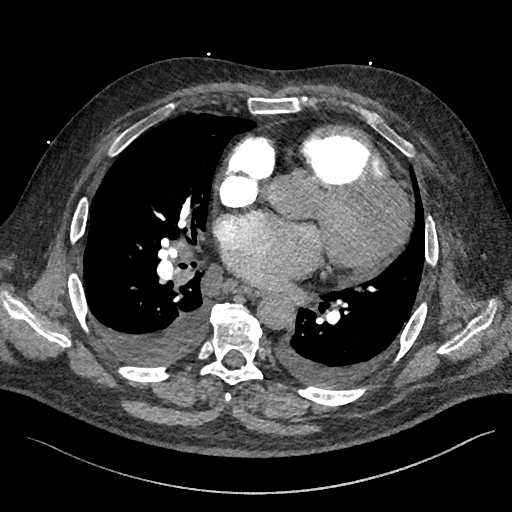
[im 241/426  lung]
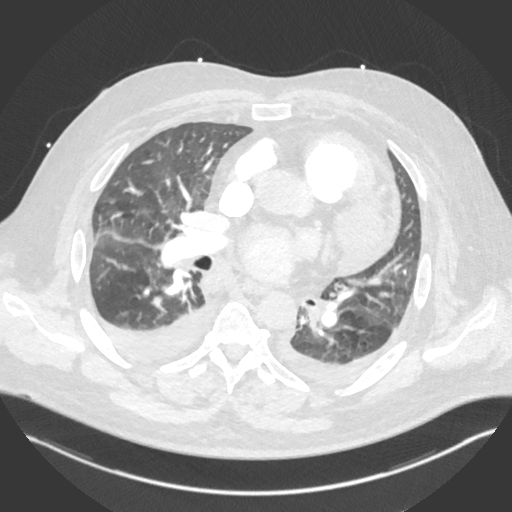
[im 259/426  soft-tissue]
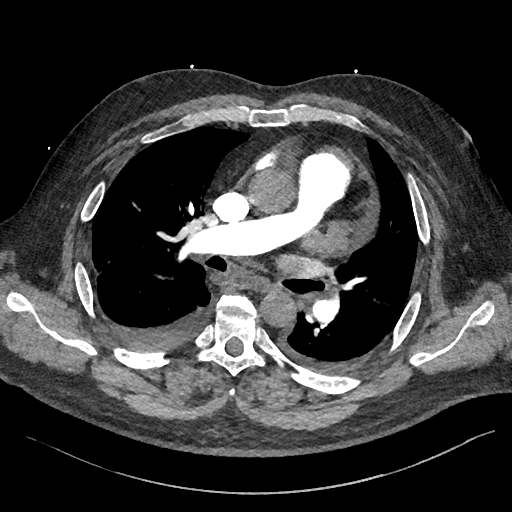
[im 296/426  lung]
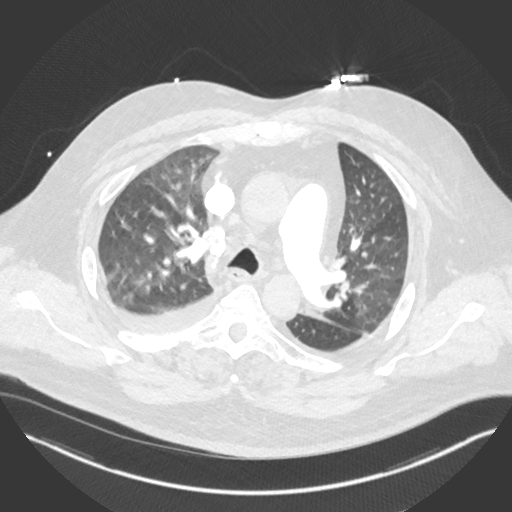
[im 315/426  soft-tissue]
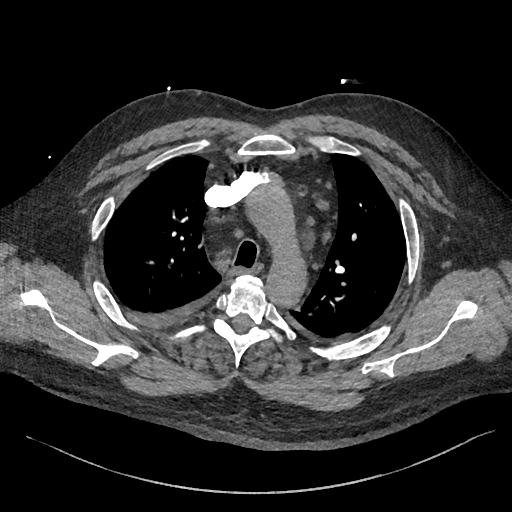
[im 352/426  lung]
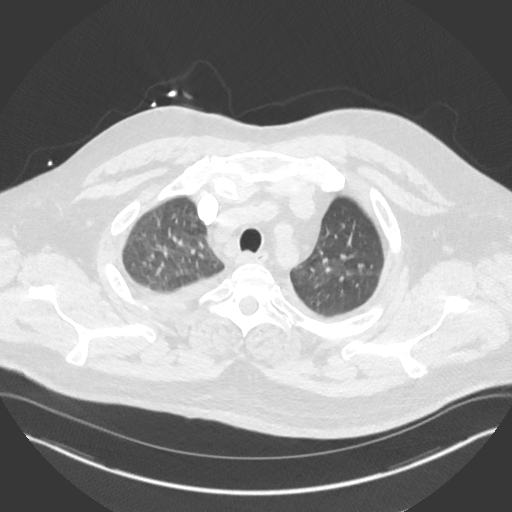
[im 370/426  soft-tissue]
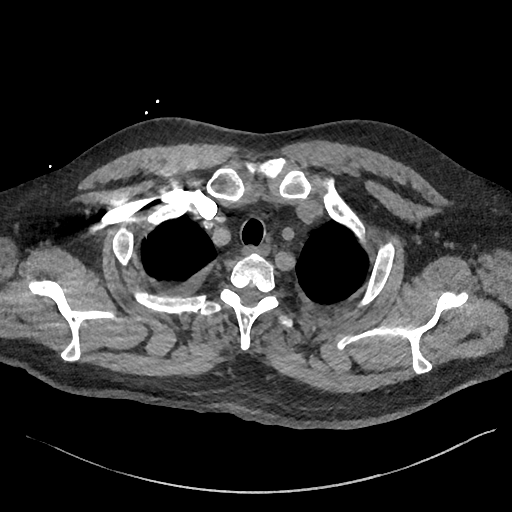
[im 407/426  lung]
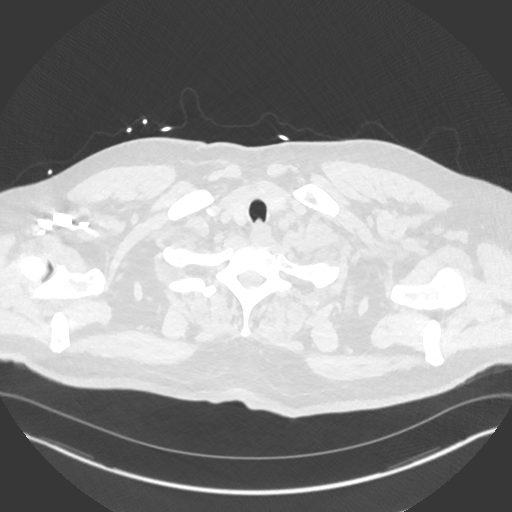

[Series 7: cor · coronal · 0.66mm/px · 3 of 175 slices shown]
[im 44/175  soft-tissue]
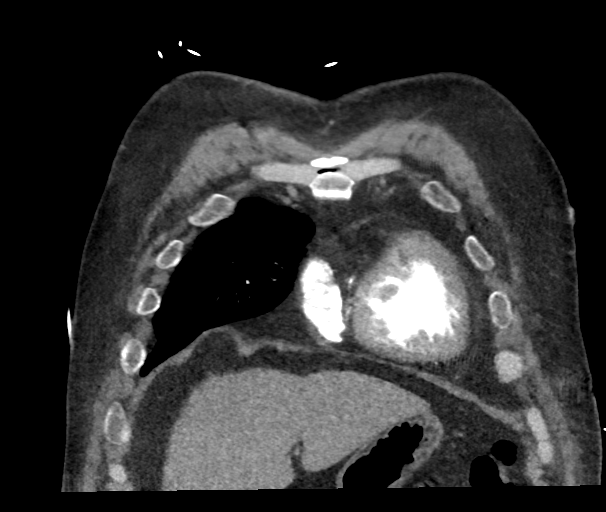
[im 88/175  soft-tissue]
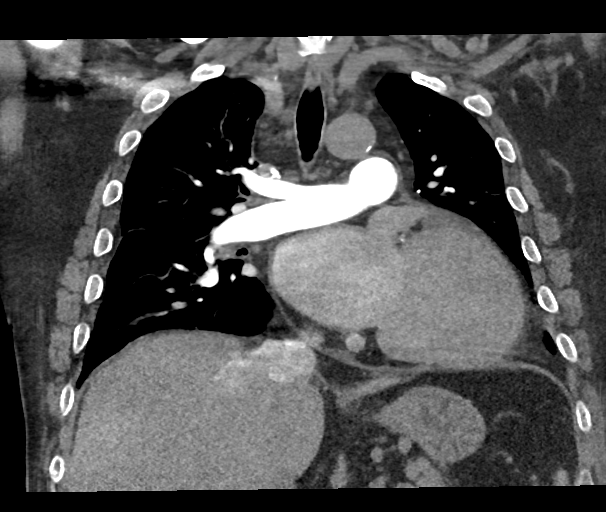
[im 131/175  soft-tissue]
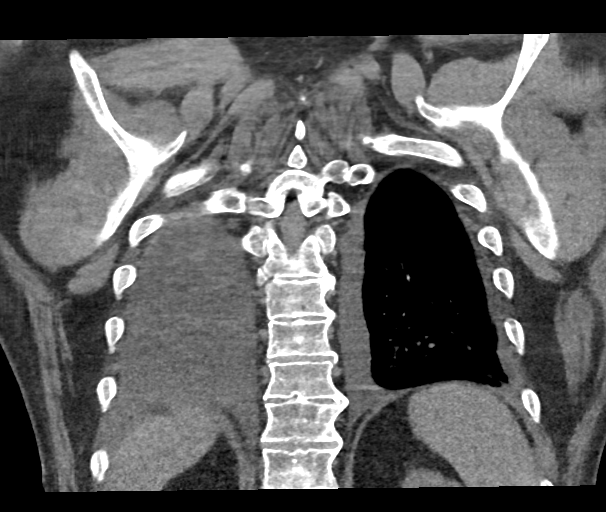

[18 of 46 positions shown; findings below may reference images not displayed]

RADIATION DOSE REDUCTION: This exam was performed according to the
departmental dose-optimization program which includes automated
exposure control, adjustment of the mA and/or kV according to
patient size and/or use of iterative reconstruction technique.

CONTRAST:  75mL OMNIPAQUE IOHEXOL 350 MG/ML SOLN
FINDINGS: Cardiovascular: Pulmonary arteries are well opacified. There is no
evidence of a pulmonary embolism.

Heart top-normal in size. Trace pericardial effusion. Three-vessel
coronary artery calcifications. Great vessels are normal in caliber.
Aorta is not opacified.

Mediastinum/Nodes: Multiple subcentimeter shotty mediastinal lymph
nodes. Enlarged right infrahilar node, 1.7 cm in short axis, similar
to the prior CT. No mediastinal or hilar masses. Trachea and
esophagus are unremarkable.

Lungs/Pleura: Small, right greater than left, pleural effusions.

Interstitial thickening and hazy areas of ground-glass opacity are
similar to the prior chest CTA, consistent with congestive heart
failure and pulmonary edema. No convincing pneumonia. Several small
calcified granuloma in the lower lobes, also stable.

No pneumothorax.

Upper Abdomen: No acute findings.

Musculoskeletal: No fracture or acute finding.  No bone lesion.

Review of the MIP images confirms the above findings.
IMPRESSION: 1. No evidence of a pulmonary embolism.
2. Findings consistent with mild congestive heart failure reflected
by borderline cardiomegaly, small pleural effusions, interstitial
thickening and hazy areas of ground-glass lung opacities. Findings
are similar to the prior chest CTA.
3. No convincing pneumonia.

## 2023-05-18 IMAGING — DX DG CHEST 1V PORT
1 series · 1 of 1 positions shown · non-contrast
Comparison: PA and lateral chest 09/27/2021.

CLINICAL DATA: CHF with increasing shortness of breath.

EXAM:
PORTABLE CHEST 1 VIEW

[chest ap]
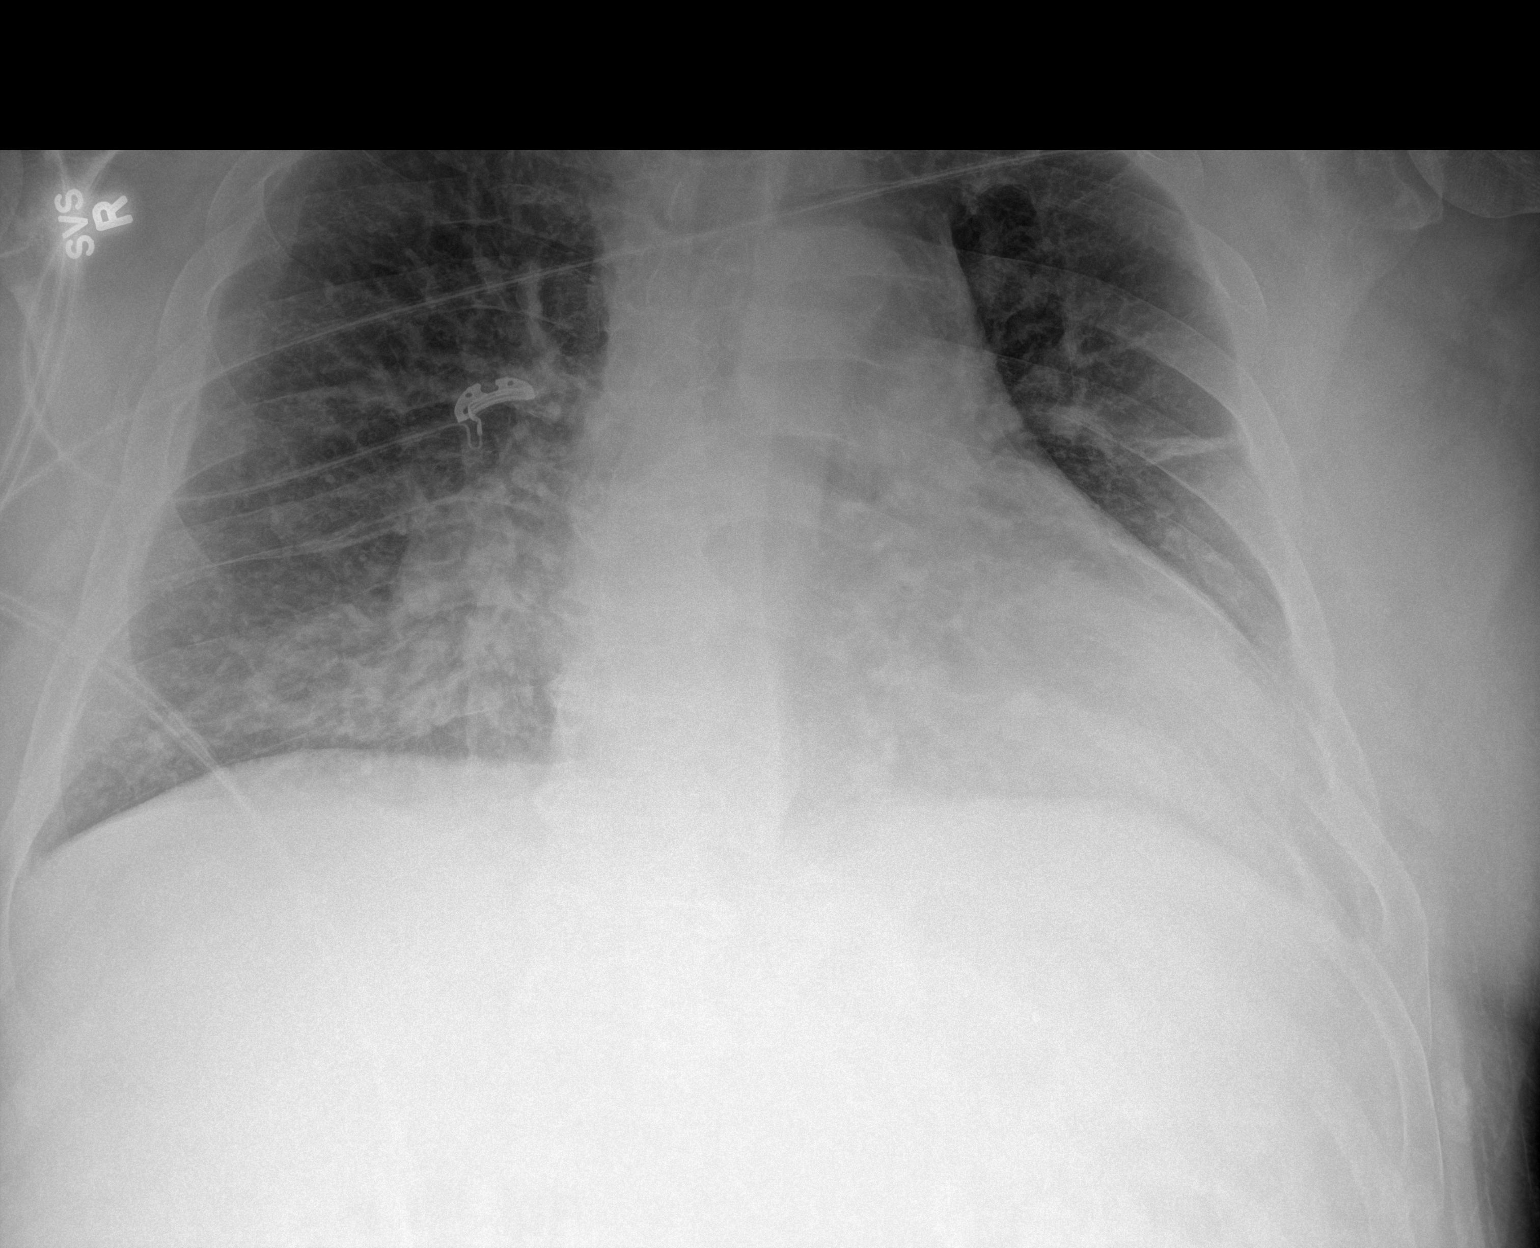

[1 of 1 positions shown; findings below may reference images not displayed]

FINDINGS: Heart is enlarged. There is perihilar vascular distension and flow
cephalization and mild interstitial edema. Minimal pleural effusions
are forming.

There are bilateral infrahilar opacities which could be due to
alveolar edema or pneumonia.

Remaining lungs show no focal infiltrates. Linear scar-like opacity
chronically seen left mid field.
IMPRESSION: Perihilar vascular congestion and mild interstitial edema consistent
with CHF or fluid overload.

There are bilateral infrahilar opacities which could reflect
alveolar edema or pneumonia, and there are minimal pleural
effusions.

Clinical correlation and radiographic follow-up recommended.

There were similar findings on the prior study but the interstitial
edema is greater today.

## 2023-05-19 ENCOUNTER — Other Ambulatory Visit: Payer: Self-pay | Admitting: Student

## 2023-05-19 DIAGNOSIS — M25512 Pain in left shoulder: Secondary | ICD-10-CM

## 2023-05-19 DIAGNOSIS — M778 Other enthesopathies, not elsewhere classified: Secondary | ICD-10-CM

## 2023-05-20 ENCOUNTER — Ambulatory Visit (INDEPENDENT_AMBULATORY_CARE_PROVIDER_SITE_OTHER): Payer: Medicaid Other

## 2023-05-20 DIAGNOSIS — I5022 Chronic systolic (congestive) heart failure: Secondary | ICD-10-CM

## 2023-05-20 DIAGNOSIS — I428 Other cardiomyopathies: Secondary | ICD-10-CM

## 2023-05-21 DIAGNOSIS — R519 Headache, unspecified: Secondary | ICD-10-CM | POA: Diagnosis not present

## 2023-05-21 DIAGNOSIS — R42 Dizziness and giddiness: Secondary | ICD-10-CM | POA: Diagnosis not present

## 2023-05-21 LAB — CUP PACEART REMOTE DEVICE CHECK
Battery Remaining Percentage: 89 %
Date Time Interrogation Session: 20241002130500
HighPow Impedance: 70 Ohm
Implantable Lead Connection Status: 753985
Implantable Lead Implant Date: 20231002
Implantable Lead Location: 753862
Implantable Lead Model: 3501
Implantable Lead Serial Number: 221766
Implantable Pulse Generator Implant Date: 20231002
Pulse Gen Serial Number: 189685

## 2023-05-26 ENCOUNTER — Other Ambulatory Visit: Payer: Self-pay

## 2023-05-26 ENCOUNTER — Inpatient Hospital Stay: Payer: Medicaid Other | Attending: Nurse Practitioner | Admitting: Nurse Practitioner

## 2023-05-26 ENCOUNTER — Encounter: Payer: Self-pay | Admitting: Nurse Practitioner

## 2023-05-26 ENCOUNTER — Inpatient Hospital Stay: Payer: Medicaid Other

## 2023-05-26 VITALS — BP 151/74 | HR 93 | Temp 97.6°F | Wt 251.0 lb

## 2023-05-26 DIAGNOSIS — Z86718 Personal history of other venous thrombosis and embolism: Secondary | ICD-10-CM | POA: Insufficient documentation

## 2023-05-26 DIAGNOSIS — I5042 Chronic combined systolic (congestive) and diastolic (congestive) heart failure: Secondary | ICD-10-CM | POA: Diagnosis not present

## 2023-05-26 DIAGNOSIS — R5383 Other fatigue: Secondary | ICD-10-CM | POA: Insufficient documentation

## 2023-05-26 DIAGNOSIS — G4733 Obstructive sleep apnea (adult) (pediatric): Secondary | ICD-10-CM | POA: Diagnosis not present

## 2023-05-26 DIAGNOSIS — D751 Secondary polycythemia: Secondary | ICD-10-CM | POA: Insufficient documentation

## 2023-05-26 DIAGNOSIS — Z87891 Personal history of nicotine dependence: Secondary | ICD-10-CM | POA: Diagnosis not present

## 2023-05-26 DIAGNOSIS — Z86711 Personal history of pulmonary embolism: Secondary | ICD-10-CM | POA: Insufficient documentation

## 2023-05-26 LAB — CBC WITH DIFFERENTIAL/PLATELET
Abs Immature Granulocytes: 0.04 10*3/uL (ref 0.00–0.07)
Basophils Absolute: 0.1 10*3/uL (ref 0.0–0.1)
Basophils Relative: 1 %
Eosinophils Absolute: 0.1 10*3/uL (ref 0.0–0.5)
Eosinophils Relative: 2 %
HCT: 49.5 % (ref 39.0–52.0)
Hemoglobin: 16.3 g/dL (ref 13.0–17.0)
Immature Granulocytes: 1 %
Lymphocytes Relative: 26 %
Lymphs Abs: 1.5 10*3/uL (ref 0.7–4.0)
MCH: 28.1 pg (ref 26.0–34.0)
MCHC: 32.9 g/dL (ref 30.0–36.0)
MCV: 85.2 fL (ref 80.0–100.0)
Monocytes Absolute: 0.4 10*3/uL (ref 0.1–1.0)
Monocytes Relative: 6 %
Neutro Abs: 3.7 10*3/uL (ref 1.7–7.7)
Neutrophils Relative %: 64 %
Platelets: 160 10*3/uL (ref 150–400)
RBC: 5.81 MIL/uL (ref 4.22–5.81)
RDW: 14 % (ref 11.5–15.5)
WBC: 5.8 10*3/uL (ref 4.0–10.5)
nRBC: 0 % (ref 0.0–0.2)

## 2023-05-26 LAB — CMP (CANCER CENTER ONLY)
ALT: 30 U/L (ref 0–44)
AST: 25 U/L (ref 15–41)
Albumin: 4.1 g/dL (ref 3.5–5.0)
Alkaline Phosphatase: 62 U/L (ref 38–126)
Anion gap: 9 (ref 5–15)
BUN: 18 mg/dL (ref 6–20)
CO2: 22 mmol/L (ref 22–32)
Calcium: 8.7 mg/dL — ABNORMAL LOW (ref 8.9–10.3)
Chloride: 105 mmol/L (ref 98–111)
Creatinine: 1.04 mg/dL (ref 0.61–1.24)
GFR, Estimated: >60 mL/min (ref >60)
Glucose, Bld: 293 mg/dL — ABNORMAL HIGH (ref 70–99)
Potassium: 3.7 mmol/L (ref 3.5–5.1)
Sodium: 136 mmol/L (ref 135–145)
Total Bilirubin: 1 mg/dL (ref 0.3–1.2)
Total Protein: 7.4 g/dL (ref 6.5–8.1)

## 2023-05-26 LAB — IRON AND TIBC
Iron: 77 ug/dL (ref 45–182)
Saturation Ratios: 20 % (ref 17.9–39.5)
TIBC: 392 ug/dL (ref 250–450)
UIBC: 315 ug/dL

## 2023-05-26 LAB — FERRITIN: Ferritin: 77 ng/mL (ref 24–336)

## 2023-05-26 NOTE — Progress Notes (Signed)
[facility] CONSULT NOTE  Patient Care Team: Jacky Kindle, FNP as PCP - General (Family Medicine) Mariah Milling Tollie Pizza, MD as PCP - Cardiology (Cardiology) Lanier Prude, MD as PCP - Electrophysiology (Cardiology) Delma Freeze, FNP as Nurse Practitioner (Family Medicine) Antonieta Iba, MD as Consulting Physician (Cardiology) Ronney Asters, Jackelyn Poling, RPH-CPP as Pharmacist Earna Coder, MD as Consulting Physician (Oncology)  CHIEF COMPLAINTS/PURPOSE OF CONSULTATION: DVT/PE - Erythrocytosis  # MAY 2021-acute PE right upper lower lobe/ right lower extremity DVT-unprovoked on xarelto x stopped 2 months [cost/insurance issues]; October 2021- Right lobar Artery- on xarelto   # CAD; chronic systolic CHF (EF of 35 to 40% in October 2021; Dr.Gollan/Dr.Klein; [place]) ; HTN; DM-2 [NOV 2021-A1c- 9.8; poorly controlled]; CKD- stage-III; smoker- trying to cut down [OCT 2021];      HISTORY OF PRESENTING ILLNESS:  Gabriel [name]. 52 y.o. male with history of multiple medical problems including CAD CHF hypertension hyperlipidemia type 2 diabetes poorly controlled CKD, ARF, DVT/PE, on Xarelto, who was last seen by Dr. Donneta Romberg in 2022, now re-referred to hematology for concerns of elevated RBCs.    He is unsure of reason for referral but upon chart review appears that He was seen by rheumatology who noticed hemoglobin of 21. Patient continues to be on Xarelto. He endorses aches and pains particularly of his hands. He is a former smoker. Denies exposure to carbon monoxide. Has sleep apnea which is controlled with CPAP. He generally feels sluggish and tired.    Review of Systems  Constitutional:  Positive for malaise/fatigue. Negative for chills, diaphoresis, fever and weight loss.  HENT:  Negative for nosebleeds and sore throat.   Eyes:  Negative for double vision.  Respiratory:  Negative for cough, hemoptysis, sputum production, shortness of breath and wheezing.    Cardiovascular:  Negative for chest pain, palpitations, orthopnea and leg swelling.  Gastrointestinal:  Negative for abdominal pain, blood in stool, constipation, diarrhea, heartburn, melena, nausea and vomiting.  Genitourinary:  Negative for dysuria, frequency and urgency.  Musculoskeletal:  Positive for back pain and joint pain.  Skin: Negative.  Negative for itching and rash.  Neurological:  Negative for dizziness, tingling, focal weakness, weakness and headaches.  Endo/Heme/Allergies:  Does not bruise/bleed easily.  Psychiatric/Behavioral:  Negative for depression. The patient is not nervous/anxious and does not have insomnia.      MEDICAL HISTORY:  Past Medical History:  Diagnosis Date   Acute on chronic combined systolic and diastolic CHF (congestive heart failure) (HCC) 09/02/2017   CAD (coronary artery disease)    a. 04/2015 low risk MV;  b. 12/2016 Cath: minor irregs in LAD/Diag/LCX/OM, RCA 40p/m/d; c. 05/2020 Cath: LM nl, LAD 50d, LCX 30p, OM1 40, RCA 100p w/ L->R collats. CO/CI 3.1/1.3-->Med rx.   Chest wall pain, chronic    Chronic Troponin Elevation    CKD (chronic kidney disease), stage II-III    COPD (chronic obstructive pulmonary disease) (HCC)    Diabetes mellitus without complication (HCC)    HFrEF (heart failure with reduced ejection fraction) (HCC)    a. 03/2015 Echo: EF 45-50%; b. 12/2015 Echo: EF 20-25%; c. 02/2016 Echo: EF 30-35%; d. 11/2016 Echo: EF 40-45%; e. 06/2019 Echo: EF 30-35%; f. 11/2019 Echo: EF 25-30%; g. 05/2020 Echo: EF 35-40%; h. 05/2021 Echo: EF 40-45%; i. 11/2021 Echo: EF 20-25%, glob HK, mild LVH, GrIII DD, sev red RV fxn, mild MR.   Hypertension    Mitral regurgitation    Mild to moderate  by October 2021 echocardiogram.   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 03/2015 Echo: EF 45-50%; b. 12/2015 Echo: EF 20-25%; c. 02/2016 Echo: EF 30-35%; d. 11/2016 Echo: EF 40-45%; e. 06/2019 Echo: EF 30-35%; f. 11/2019 Echo: EF 25-30%; g. 05/2020  Echo: EF 35-40%; h. 05/2021 Echo: EF 40-45%; i. 11/2021 Echo: EF 20-25%   Myocardial infarct (HCC)    NSVT (nonsustained ventricular tachycardia) (HCC)    a. 12/2015 noted on tele-->amio;  b. 12/2015 Event monitor: no VT noted.   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  12/31/19: RUL and RLL PEs.   Syncope    a. 01/2016 - felt to be vasovagal.    SURGICAL HISTORY: Past Surgical History:  Procedure Laterality Date   AMPUTATION     CARDIAC CATHETERIZATION     COLONOSCOPY WITH PROPOFOL N/A 08/01/2022   Procedure: COLONOSCOPY WITH PROPOFOL;  Surgeon: Toney Reil, MD;  Location: [facility] ENDOSCOPY;  Service: Gastroenterology;  Laterality: N/A;   ESOPHAGOGASTRODUODENOSCOPY (EGD) WITH PROPOFOL N/A 08/01/2022   Procedure: ESOPHAGOGASTRODUODENOSCOPY (EGD) WITH PROPOFOL;  Surgeon: Toney Reil, MD;  Location: [facility] ENDOSCOPY;  Service: Gastroenterology;  Laterality: N/A;   ESOPHAGOGASTRODUODENOSCOPY (EGD) WITH PROPOFOL N/A 10/16/2022   Procedure: ESOPHAGOGASTRODUODENOSCOPY (EGD) WITH PROPOFOL;  Surgeon: Toney Reil, MD;  Location: [facility] ENDOSCOPY;  Service: Gastroenterology;  Laterality: N/A;   FINGER AMPUTATION     Traumatic   FINGER FRACTURE SURGERY Left    FRACTURE SURGERY     LEFT HEART CATH AND CORONARY ANGIOGRAPHY N/A 01/06/2017   Procedure: Left Heart Cath and Coronary Angiography;  Surgeon: Iran Ouch, MD;  Location: [facility] INVASIVE CV LAB;  Service: Cardiovascular;  Laterality: N/A;   RIGHT/LEFT HEART CATH AND CORONARY ANGIOGRAPHY N/A 06/13/2020   Procedure: RIGHT/LEFT HEART CATH AND CORONARY ANGIOGRAPHY;  Surgeon: Yvonne Kendall, MD;  Location: [facility] INVASIVE CV LAB;  Service: Cardiovascular;  Laterality: N/A;   SUBQ ICD IMPLANT N/A 05/20/2022   Procedure: SUBQ ICD IMPLANT;  Surgeon: Lanier Prude, MD;  Location: [facility] INVASIVE CV LAB;  Service: Cardiovascular;  Laterality: N/A;     SOCIAL HISTORY: Social History   Socioeconomic History   Marital status: Widowed    Spouse name: Not on file   Number of children: 1   Years of education: 54   Highest education level: 12th grade  Occupational History   Occupation: disabled  Tobacco Use   Smoking status: Former    Current packs/day: 0.00    Average packs/day: 0.5 packs/day for 33.0 years (16.5 ttl pk-yrs)    Types: Cigarettes    Start date: 05/09/1987    Quit date: 05/08/2020    Years since quitting: 3.0   Smokeless tobacco: Former    Quit date: 05/08/2020   Tobacco comments:    Quit Sept 2021  Vaping Use   Vaping status: Former  Substance and Sexual Activity   Alcohol use: Not Currently    Comment: occassionally   Drug use: No   Sexual activity: Not Currently  Other Topics Concern   Not on file  Social History Narrative   Lives at home with wife and his dad's wife.        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    Social Determinants of Health   Financial Resource Strain: Medium Risk (08/05/2022)   Overall Financial Resource Strain (  CARDIA)    Difficulty of Paying Living Expenses: Somewhat hard  Food Insecurity: No Food Insecurity (03/08/2023)   Hunger Vital Sign    Worried About Running Out of Food in the Last Year: Never true    Ran Out of Food in the Last Year: Never true  Transportation Needs: No Transportation Needs (03/08/2023)   PRAPARE - Administrator, Civil Service (Medical): No    Lack of Transportation (Non-Medical): No  Physical Activity: Insufficiently Active (01/20/2018)   Exercise Vital Sign    Days of Exercise per Week: 7 days    Minutes of Exercise per Session: 10 min  Stress: Stress Concern Present (09/02/2017)   [name] of Occupational Health - Occupational Stress Questionnaire    Feeling of Stress : Very much  Social Connections: Moderately Isolated (09/02/2017)   Social Connection and  Isolation Panel [NHANES]    Frequency of Communication with Friends and Family: Twice a week    Frequency of Social Gatherings with Friends and Family: Once a week    Attends Religious Services: Never    Database administrator or Organizations: No    Attends Banker Meetings: Never    Marital Status: Widowed  Intimate Partner Violence: Not At Risk (03/08/2023)   Humiliation, Afraid, Rape, and Kick questionnaire    Fear of Current or Ex-Partner: No    Emotionally Abused: No    Physically Abused: No    Sexually Abused: No    FAMILY HISTORY: Family History  Problem Relation Age of Onset   Diabetes Mother    Diabetes Mellitus II Mother    Hypothyroidism Mother    Hypertension Mother    Kidney failure Mother        Dialysis   Heart attack Mother        66 yo approximately   Hypertension Father    Gout Father    Cancer Maternal Grandfather    Diabetes Maternal Grandfather    Cancer Paternal Aunt     ALLERGIES:  is allergic to metformin and related and zantac [ranitidine hcl].  MEDICATIONS:  Current Outpatient Medications  Medication Sig Dispense Refill   Accu-Chek Softclix Lancets lancets USE UP TO FOUR TIMES DAILY     albuterol (VENTOLIN HFA) 108 (90 Base) MCG/ACT inhaler Inhale 2 puffs into the lungs every 4 (four) hours as needed for wheezing or shortness of breath. 8 g 6   allopurinol (ZYLOPRIM) 300 MG tablet Take 300 mg by mouth daily.     azelastine (ASTELIN) 0.1 % nasal spray Place 2 sprays into both nostrils 2 (two) times daily. Use in each nostril as directed 30 mL 12   BD PEN NEEDLE NANO 2ND GEN 32G X 4 MM MISC USE  AS DIRECTED AT BEDTIME 200 each 0   blood glucose meter kit and supplies KIT Dispense based on patient and insurance preference. Use up to four times daily as directed. 1 each 11   buPROPion (WELLBUTRIN XL) 150 MG 24 hr tablet Take 1 tablet (150 mg total) by mouth daily. 90 tablet 3   carvedilol (COREG) 6.25 MG tablet Take 2 tablets (12.5 mg  total) by mouth 2 (two) times daily. Increased from 6.25 mg 2 times daily. 120 tablet 0   cetirizine (ZYRTEC) 10 MG tablet Take 1 tablet (10 mg total) by mouth daily. 30 tablet 11   colchicine 0.6 MG tablet Day 1: Take 2 tablets PO for gout flare, repeat 1 tablet one hour following initial dose.  Day 2: Take 1 tablet twice daily PO for an additional 2-3 days. 60 tablet 0   Continuous Glucose Sensor (FREESTYLE LIBRE 3 SENSOR) MISC PLACE 1 SENSOR ON TO THE SKIN EVERY 14 DAYS. USE TO CHECK BLOOD SUGAR CONTINUEOUSLY.     Continuous Glucose Sensor (FREESTYLE LIBRE 3 SENSOR) MISC 1 Application by Does not apply route every 14 (fourteen) days. Use to check blood sugar continuously. 6 each 0   cyclobenzaprine (FLEXERIL) 5 MG tablet Take 1 tablet (5 mg total) by mouth as needed. 10 tablet 0   dapagliflozin propanediol (FARXIGA) 10 MG TABS tablet Take 1 tablet (10 mg total) by mouth daily. 30 tablet 5   diazepam (VALIUM) 5 MG tablet Take 1 tablet (5 mg total) by mouth 2 (two) times daily as needed (vertigo). 60 tablet 5   dicyclomine (BENTYL) 10 MG capsule Take 2 capsules (20 mg total) by mouth 4 (four) times daily -  before meals and at bedtime. 240 capsule 0   fenofibrate (TRICOR) 145 MG tablet Take 1 tablet (145 mg total) by mouth daily. 90 tablet 3   FLUoxetine (PROZAC) 20 MG capsule Take 1 capsule (20 mg total) by mouth daily. 90 capsule 3   fluticasone (FLONASE) 50 MCG/ACT nasal spray Place 2 sprays into both nostrils daily as needed. Home med.     icosapent Ethyl (VASCEPA) 1 g capsule Take 2 capsules (2 g total) by mouth 2 (two) times daily. 360 capsule 1   insulin aspart (NOVOLOG) 100 UNIT/ML FlexPen Inject 10 Units into the skin 3 (three) times daily with meals. Inject 10 units + correction before breakfast/lunch and supper (correction = 1 unit per 50 mg/dL above 161); max daily dose 36 units 15 mL 3   Insulin Glargine (BASAGLAR KWIKPEN) 100 UNIT/ML Inject 18 Units into the skin at bedtime. Reduced from  38 units. 15 mL 3   insulin glargine-yfgn (SEMGLEE) 100 UNIT/ML injection Inject into the skin.     Insulin Pen Needle 32G X 4 MM MISC 1 each by Does not apply route in the morning, at noon, in the evening, and at bedtime. 120 each 11   meclizine (ANTIVERT) 25 MG tablet Take 1 tablet (25 mg total) by mouth 3 (three) times daily as needed for dizziness. 60 tablet 1   metoCLOPramide (REGLAN) 5 MG tablet Take 1 tablet (5 mg total) by mouth every 8 (eight) hours as needed for nausea. 30 tablet 0   naltrexone (DEPADE) 50 MG tablet Take 1 tablet (50 mg total) by mouth daily. 30 tablet 2   nitroGLYCERIN (NITROSTAT) 0.4 MG SL tablet Place 1 tablet (0.4 mg total) under the tongue every 5 (five) minutes x 3 doses as needed for chest pain. 30 tablet 0   omeprazole (PRILOSEC) 40 MG capsule Take 1 capsule by mouth once daily 90 capsule 0   OZEMPIC, 2 MG/DOSE, 8 MG/3ML SOPN INJECT 2MG  SUBCUTANEOUSLY ONCE A WEEK 9 mL 0   Peppermint Oil (IBGARD PO) Take 1 tablet by mouth daily.     potassium chloride SA (KLOR-CON M) 20 MEQ tablet Take 1 tablet (20 mEq total) by mouth every other day. 15 tablet 6   predniSONE (DELTASONE) 20 MG tablet Take 1 tablet (20 mg total) by mouth daily. 10 tablet 0   Resmetirom (REZDIFFRA) 100 MG TABS Take by mouth.     REZDIFFRA 100 MG TABS Take by mouth.     rivaroxaban (XARELTO) 20 MG TABS tablet Take 1 tablet (20 mg total) by mouth daily  with supper. 90 tablet 3   rosuvastatin (CRESTOR) 20 MG tablet Take 1 tablet (20 mg total) by mouth daily. 90 tablet 3   sacubitril-valsartan (ENTRESTO) 97-103 MG Hold until followup with cardiology due to acute kidney injury. (Patient taking differently: Take 1 tablet by mouth 2 (two) times daily. Hold until followup with cardiology due to acute kidney injury.)     spironolactone (ALDACTONE) 25 MG tablet Hold until followup with cardiology due to acute kidney injury. (Patient taking differently: Take 25 mg by mouth daily. Hold until followup with  cardiology due to acute kidney injury.)     TALTZ 80 MG/ML SOAJ Inject 3 mLs into the skin every 14 (fourteen) days.     torsemide (DEMADEX) 20 MG tablet Take 2 tablets (40 mg total) by mouth daily. 180 tablet 3   umeclidinium-vilanterol (ANORO ELLIPTA) 62.5-25 MCG/ACT AEPB Inhale 1 puff into the lungs daily. 30 each 12   No current facility-administered medications for this visit.    PHYSICAL EXAMINATION: Vitals:   05/26/23 1030  BP: (!) 151/74  Pulse: 93  Temp: 97.6 F (36.4 C)  SpO2: 97%   Filed Weights   05/26/23 1030  Weight: 251 lb (113.9 kg)   Physical Exam Constitutional:      Appearance: He is obese. He is not ill-appearing.     Comments: Alone.  Ambulating independently.  HENT:     Head: Normocephalic and atraumatic.  Eyes:     General: No scleral icterus. Cardiovascular:     Rate and Rhythm: Normal rate and regular rhythm.  Pulmonary:     Effort: No respiratory distress.  Abdominal:     General: There is no distension.     Palpations: Abdomen is soft.     Tenderness: There is no abdominal tenderness.  Musculoskeletal:     Comments: Ambulates w/o aids  Skin:    General: Skin is warm.  Neurological:     Mental Status: He is alert and oriented to person, place, and time.  Psychiatric:        Mood and Affect: Mood and affect normal.        Behavior: Behavior normal.    LABORATORY DATA:  I have reviewed the data as listed Lab Results  Component Value Date   WBC 5.8 05/26/2023   HGB 16.3 05/26/2023   HCT 49.5 05/26/2023   MCV 85.2 05/26/2023   PLT 160 05/26/2023   Recent Labs    03/07/23 1758 03/08/23 0353 03/09/23 0413 04/14/23 1035 04/23/23 1026 05/26/23 1107  NA  --  139   < > 139 140 136  K  --  3.3*   < > 4.7 3.7 3.7  CL  --  103   < > 99 103 105  CO2  --  25   < > 28 27 22   GLUCOSE  --  186*   < > 199* 132* 293*  BUN  --  35*   < > 33* 30* 18  CREATININE  --  2.07*   < > 1.83* 1.36* 1.04  CALCIUM  --  8.6*   < > 10.0 9.2 8.7*   GFRNONAA  --  37*   < > 43* >60 >60  PROT 6.9 6.6  --   --   --  7.4  ALBUMIN 4.0 3.8  --   --   --  4.1  AST 20 17  --   --   --  25  ALT 29 28  --   --   --  30  ALKPHOS 58 60  --   --   --  62  BILITOT 1.2 1.4*  --   --   --  1.0  BILIDIR 0.1  --   --   --   --   --   IBILI 1.1*  --   --   --   --   --    < > = values in this interval not displayed.   Iron/TIBC/Ferritin/ %Sat    Component Value Date/Time   IRON 77 05/26/2023 1108   TIBC 392 05/26/2023 1108   FERRITIN 77 05/26/2023 1108   IRONPCTSAT 20 05/26/2023 1108     RADIOGRAPHIC STUDIES: I have personally reviewed the radiological images as listed and agreed with the findings in the report. CUP PACEART REMOTE DEVICE CHECK  Result Date: 05/21/2023 Scheduled remote reviewed. Normal device function.  Next remote 91 days. [place], CVRSSensing Configuration: Secondary Gain Setting: 1X Post Shock Pacing: OFF    ASSESSMENT & PLAN:   Erythrocytosis/Polycythemia- December 2019 (hemoglobin 17.8 and has had intermittent elevated readings since that time. 2023 more consistently elevated readings. Hemoglobin at Rheumatology was 21.2. Hematocrit 64.9. Symptomatic. Today we reviewed that there are two types of polycythemia, Primary polycythemia and secondary polycythemia. Primary polycythemia is overproduction of red blood cells due to a driver mutation. The most common mutation is the JAK2 [id number] (95% of cases), but there are other mutations which can cause this disorder. Primary polycythemia is a myeloproliferative neoplasm which may require cytoreductive therapy to decrease risk of thrombosis. This can consist of medications or phlebotomy to drive down the red blood cell counts. Secondary polycythemia is polycythemia driven by low oxygen levels. This represents an appropriate response of the body attempting to increase red cell volume. Causes of secondary polycythemia include smoking (most common), obstructive sleep apnea (OSA), or living at  altitude. This can also be caused by testosterone supplementation. Certain thalassemias can present with marked erythrocytosis , but normal hemoglobin. Secondary polycythemia does not have the same level of thrombotic risk and therefore does not require cytoreductive therapy or phlebotomy.   He is a former smoker. Does not use supplemental testosterone. He does have OSA but reports controlled with CPAP. He does have renal, pulmonary, and cardiac comorbidities. Recommend workup today including  CBC, CMP, reticulocyte panel, erythropoietin level, ferritin, iron studies, carbon monoxide, and JAK2 with reflex and BCR/ABL FISH. Based on results he may need BMBx. Given that his hemoglobin is elevated, I'd recommend therapeutic phlebotomy this week to help improve symptoms and reduce risk of complications.   Repeat hemoglobin today is normal at 16.3 with hct of 49.5. Ferritin and iron studies were normal. Remainder of workup is pending.   # Thrombocytopenia- Plt 160. Normal today.   # Pulmonary Embolism - on right. Unprovoked PE May 2021. Was on eliquis but noncompliance secondary to cost & insurance. September 2021- acute PE. On xarelto, reports compliance. Managed by Cardiology. Had previously recommended switching to eliquis 5 mg twice daily given renal failure but defer to cardiology.   # CHF- on diuretics. Managed by cardiology.   Disposition:  Lab today (cbc, cmp, EPO, Ferritin, iron, CO, JAK2, BCR-ABL1 FISH) Phlebotomy this week 3 weeks- lab, see me, +/- phlebotomy- la  No problem-specific Assessment & Plan notes found for this encounter.  All questions were answered. The patient knows to call the clinic with any problems, questions or concerns.   Alinda Dooms, NP 05/26/2023

## 2023-05-27 ENCOUNTER — Encounter: Payer: Medicaid Other | Admitting: Family

## 2023-05-27 ENCOUNTER — Inpatient Hospital Stay: Payer: Medicaid Other

## 2023-05-27 VITALS — BP 158/89 | HR 90 | Resp 20

## 2023-05-27 DIAGNOSIS — Z86718 Personal history of other venous thrombosis and embolism: Secondary | ICD-10-CM | POA: Diagnosis not present

## 2023-05-27 DIAGNOSIS — I5042 Chronic combined systolic (congestive) and diastolic (congestive) heart failure: Secondary | ICD-10-CM | POA: Diagnosis not present

## 2023-05-27 DIAGNOSIS — R5383 Other fatigue: Secondary | ICD-10-CM | POA: Diagnosis not present

## 2023-05-27 DIAGNOSIS — Z86711 Personal history of pulmonary embolism: Secondary | ICD-10-CM | POA: Diagnosis not present

## 2023-05-27 DIAGNOSIS — Z87891 Personal history of nicotine dependence: Secondary | ICD-10-CM | POA: Diagnosis not present

## 2023-05-27 DIAGNOSIS — G4733 Obstructive sleep apnea (adult) (pediatric): Secondary | ICD-10-CM | POA: Diagnosis not present

## 2023-05-27 DIAGNOSIS — D751 Secondary polycythemia: Secondary | ICD-10-CM

## 2023-05-27 LAB — ERYTHROPOIETIN: Erythropoietin: 23.3 m[IU]/mL — ABNORMAL HIGH (ref 2.6–18.5)

## 2023-05-27 LAB — CARBON MONOXIDE, BLOOD (PERFORMED AT REF LAB): Carbon Monoxide, Blood: 3.4 % (ref 0.0–3.6)

## 2023-05-27 NOTE — Patient Instructions (Signed)
Therapeutic Phlebotomy, Care After The following information offers guidance on how to care for yourself after your procedure. Your health care provider may also give you more specific instructions. If you have problems or questions, contact your health care provider. What can I expect after the procedure? After therapeutic phlebotomy, it is common to have: Light-headedness or dizziness. You may feel faint. Nausea. Tiredness (fatigue). Follow these instructions at home: Eating and drinking Be sure to eat well-balanced meals for the next 24 hours. Drink enough fluid to keep your urine pale yellow. Avoid drinking alcohol on the day that you had the procedure. Activity  Return to your normal activities as told by your health care provider. Most people can go back to their normal activities right away. Avoid activities that take a lot of effort for about 5 hours after the procedure. Athletes should avoid strenuous exercise for at least 12 hours. Avoid heavy lifting or pulling for about 5 hours after the procedure. Do not lift anything that is heavier than 10 lb (4.5 kg). Change positions slowly for the remainder of the day, like from sitting to standing. This can help prevent light-headedness or fainting. If you feel light-headed, lie down until the feeling goes away. Needle insertion site care  Keep your bandage (dressing) dry. You can remove the bandage after about 5 hours or as told by your health care provider. If you have bleeding from the needle insertion site, raise (elevate) your arm and press firmly on the site until the bleeding stops. If you have bruising at the site, apply ice to the area. To do this: Put ice in a plastic bag. Place a towel between your skin and the bag. Leave the ice on for 20 minutes, 2-3 times a day for the first 24 hours. Remove the ice if your skin turns bright red so you do not damage the area. If the swelling does not go away after 24 hours, apply a warm,  moist cloth (warm compress) to the area for 20 minutes, 2-3 times a day. General instructions Do not use any products that contain nicotine or tobacco, like cigarettes, chewing tobacco, and vaping devices, such as e-cigarettes, for at least 30 minutes after the procedure. If you need help quitting, ask your health care provider. Keep all follow-up visits. You may need to continue having regular blood tests and therapeutic phlebotomy treatments as directed. Contact a health care provider if: You have redness, swelling, or pain at the needle insertion site. Fluid or blood is coming from the needle insertion site. Pus or a bad smell is coming from the needle insertion site. The needle insertion site feels warm to the touch. You feel light-headed, dizzy, or nauseous, and the feeling does not go away. You have new bruising at the needle insertion site. You feel weaker than normal. You have a fever or chills. Get help right away if: You have chest pain. You have trouble breathing. You have severe nausea or vomiting. Summary After the procedure, it is common to have some light-headedness, dizziness, nausea, or tiredness (fatigue). Be sure to eat well-balanced meals for the next 24 hours. Drink enough fluid to keep your urine pale yellow. Return to your normal activities as told by your health care provider. Keep all follow-up visits. You may need to continue having regular blood tests and therapeutic phlebotomy treatments as directed. This information is not intended to replace advice given to you by your health care provider. Make sure you discuss any questions you have   with your health care provider. Document Revised: 01/31/2021 Document Reviewed: 01/31/2021 Elsevier Patient Education  2024 [employer] Inc[employer]  

## 2023-05-28 IMAGING — CR DG CHEST 2V
2 series · 2 of 2 positions shown · non-contrast
Comparison: 11/24/2021

CLINICAL DATA: Chest pain

EXAM:
CHEST - 2 VIEW

[chest pa]
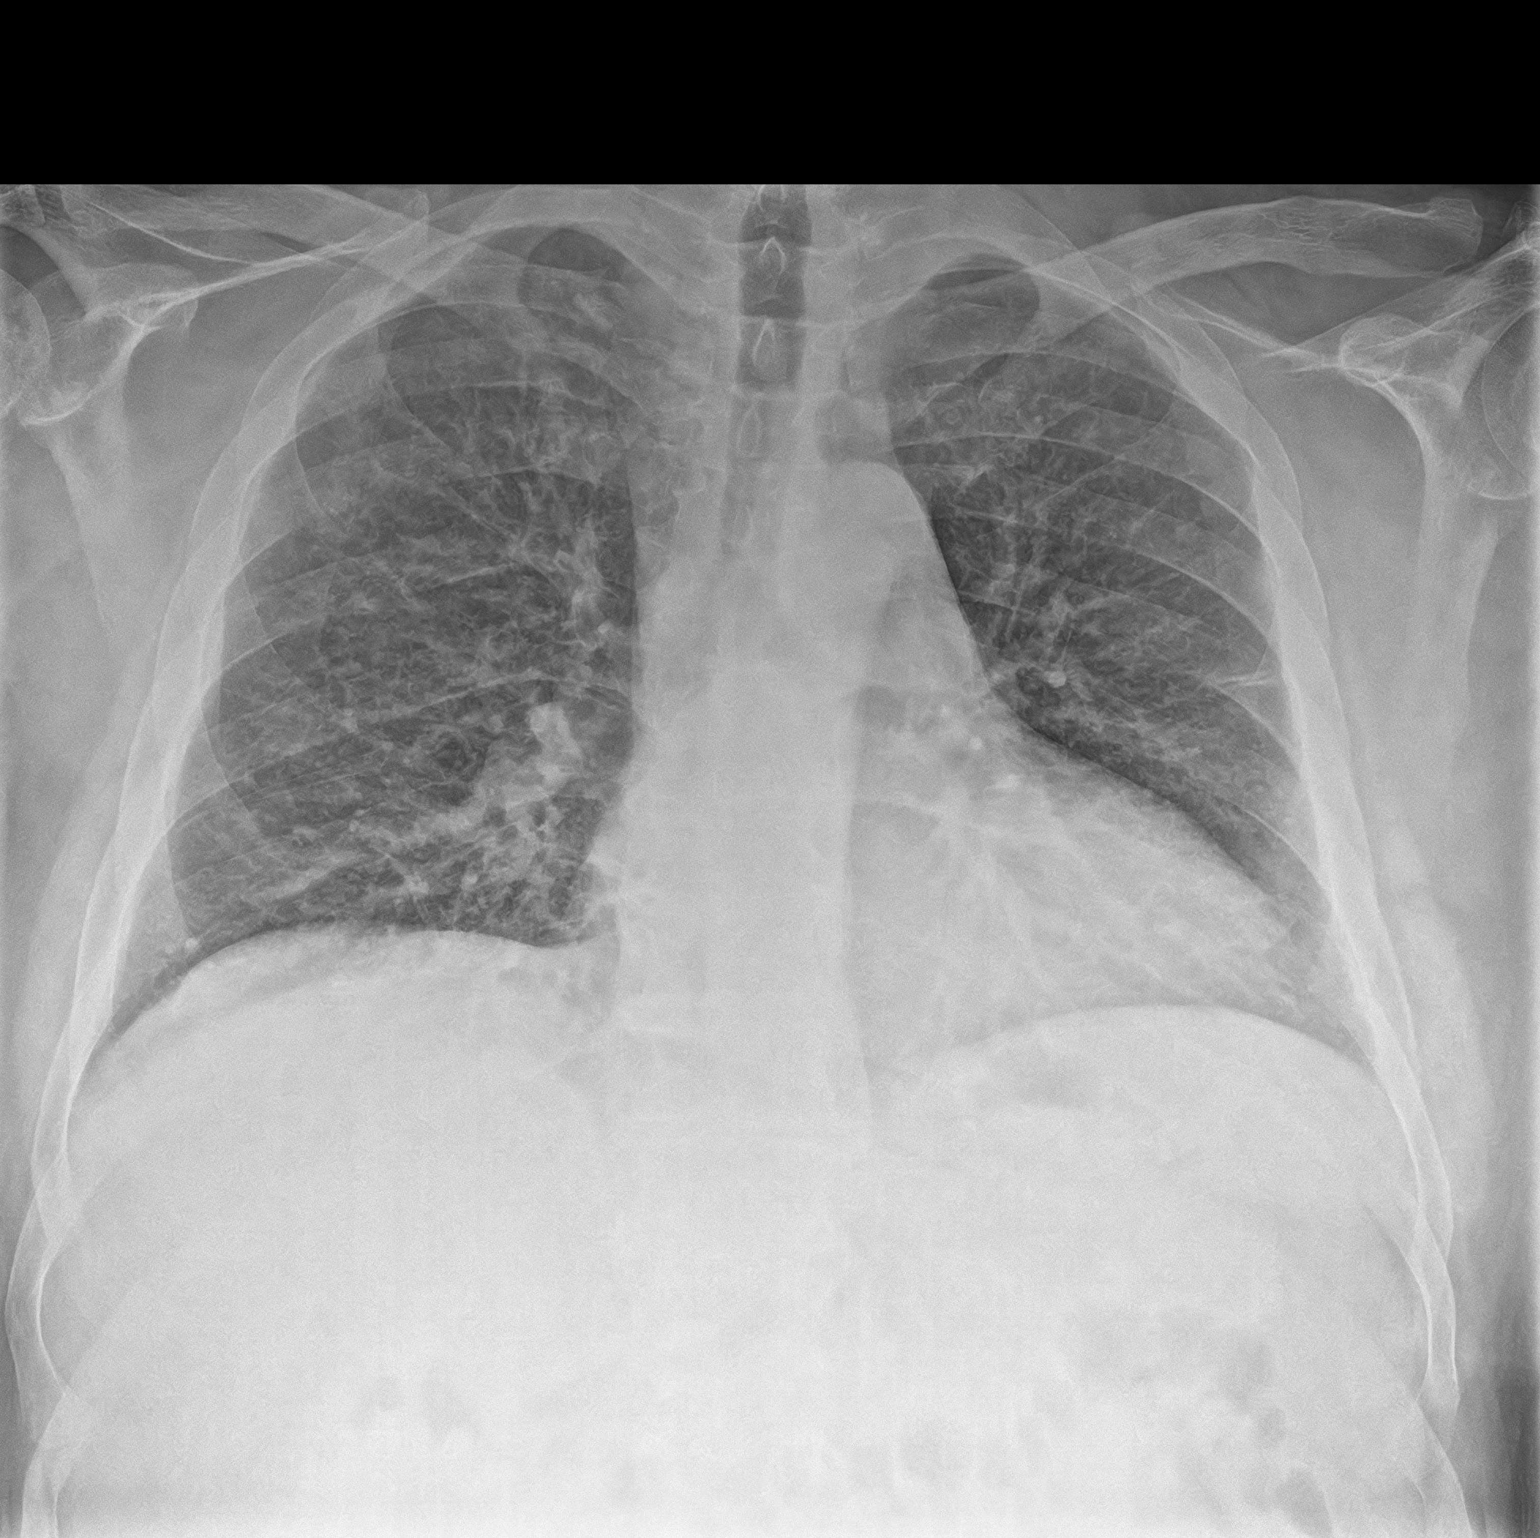

[chest lat]
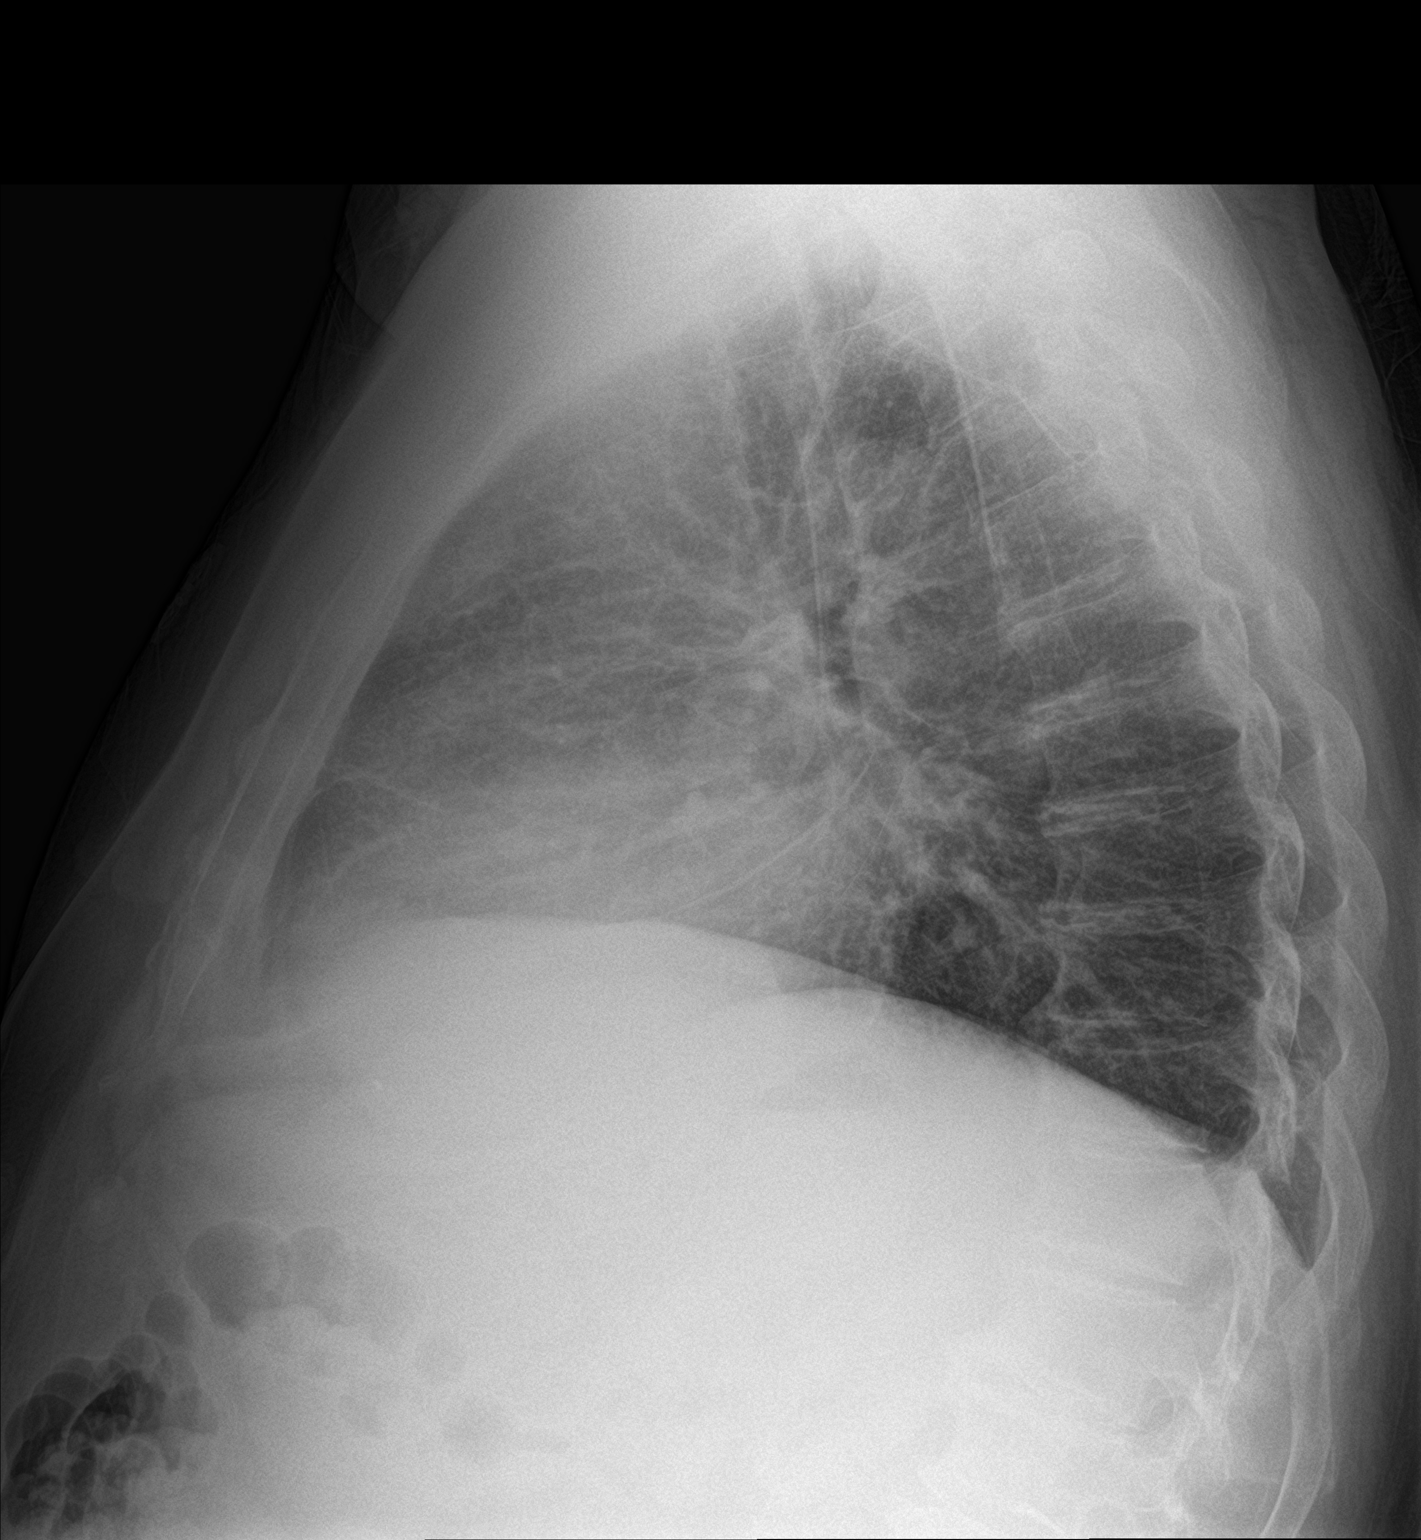

[2 of 2 positions shown; findings below may reference images not displayed]

FINDINGS: Heart size within normal limits. Slightly low lung volumes.
Increased interstitial markings bilaterally. Improved aeration of
the lung bases compared to prior. No pleural effusion or
pneumothorax.
IMPRESSION: Increased interstitial markings bilaterally which may reflect
interstitial edema or atypical/viral infection. Improved aeration of
the lung bases compared to prior.

## 2023-05-29 ENCOUNTER — Telehealth: Payer: Self-pay

## 2023-05-29 NOTE — Telephone Encounter (Signed)
We received a a adverse reaction form to filled out from the company of [employer]. Patient was having nausea and diarrhea as a side effect to the medication. Filled out the form and faxed back to the company of what we did

## 2023-05-30 LAB — BCR-ABL1 FISH
Cells Analyzed: 200
Cells Counted: 200

## 2023-05-30 LAB — JAK2 V617F RFX CALR/MPL/E12-15

## 2023-05-30 LAB — CALR +MPL + E12-E15  (REFLEX)

## 2023-06-03 NOTE — Progress Notes (Signed)
Remote ICD transmission.   

## 2023-06-09 ENCOUNTER — Emergency Department: Payer: Medicaid Other

## 2023-06-09 ENCOUNTER — Emergency Department
Admission: EM | Admit: 2023-06-09 | Discharge: 2023-06-09 | Disposition: A | Discharge status: Home / Self Care | Payer: Medicaid Other | Attending: Emergency Medicine | Admitting: Emergency Medicine

## 2023-06-09 ENCOUNTER — Other Ambulatory Visit: Payer: Self-pay

## 2023-06-09 DIAGNOSIS — R918 Other nonspecific abnormal finding of lung field: Secondary | ICD-10-CM | POA: Diagnosis not present

## 2023-06-09 DIAGNOSIS — I509 Heart failure, unspecified: Secondary | ICD-10-CM | POA: Insufficient documentation

## 2023-06-09 DIAGNOSIS — I2510 Atherosclerotic heart disease of native coronary artery without angina pectoris: Secondary | ICD-10-CM | POA: Diagnosis not present

## 2023-06-09 DIAGNOSIS — Z7901 Long term (current) use of anticoagulants: Secondary | ICD-10-CM | POA: Insufficient documentation

## 2023-06-09 DIAGNOSIS — R0789 Other chest pain: Secondary | ICD-10-CM

## 2023-06-09 DIAGNOSIS — Z87891 Personal history of nicotine dependence: Secondary | ICD-10-CM | POA: Diagnosis not present

## 2023-06-09 DIAGNOSIS — E119 Type 2 diabetes mellitus without complications: Secondary | ICD-10-CM | POA: Diagnosis not present

## 2023-06-09 DIAGNOSIS — J181 Lobar pneumonia, unspecified organism: Secondary | ICD-10-CM | POA: Diagnosis not present

## 2023-06-09 DIAGNOSIS — R079 Chest pain, unspecified: Secondary | ICD-10-CM | POA: Diagnosis not present

## 2023-06-09 DIAGNOSIS — J449 Chronic obstructive pulmonary disease, unspecified: Secondary | ICD-10-CM | POA: Diagnosis not present

## 2023-06-09 DIAGNOSIS — I517 Cardiomegaly: Secondary | ICD-10-CM | POA: Diagnosis not present

## 2023-06-09 DIAGNOSIS — Z86711 Personal history of pulmonary embolism: Secondary | ICD-10-CM | POA: Diagnosis not present

## 2023-06-09 DIAGNOSIS — J189 Pneumonia, unspecified organism: Secondary | ICD-10-CM

## 2023-06-09 DIAGNOSIS — J168 Pneumonia due to other specified infectious organisms: Secondary | ICD-10-CM | POA: Diagnosis not present

## 2023-06-09 DIAGNOSIS — R0602 Shortness of breath: Secondary | ICD-10-CM | POA: Diagnosis present

## 2023-06-09 LAB — BASIC METABOLIC PANEL
Anion gap: 9 (ref 5–15)
BUN: 22 mg/dL — ABNORMAL HIGH (ref 6–20)
CO2: 24 mmol/L (ref 22–32)
Calcium: 9 mg/dL (ref 8.9–10.3)
Chloride: 105 mmol/L (ref 98–111)
Creatinine, Ser: 1.14 mg/dL (ref 0.61–1.24)
GFR, Estimated: >60 mL/min (ref >60)
Glucose, Bld: 175 mg/dL — ABNORMAL HIGH (ref 70–99)
Potassium: 3.9 mmol/L (ref 3.5–5.1)
Sodium: 138 mmol/L (ref 135–145)

## 2023-06-09 LAB — CBC
HCT: 47.5 % (ref 39.0–52.0)
Hemoglobin: 15.4 g/dL (ref 13.0–17.0)
MCH: 27.9 pg (ref 26.0–34.0)
MCHC: 32.4 g/dL (ref 30.0–36.0)
MCV: 86.1 fL (ref 80.0–100.0)
Platelets: 183 10*3/uL (ref 150–400)
RBC: 5.52 MIL/uL (ref 4.22–5.81)
RDW: 14.5 % (ref 11.5–15.5)
WBC: 7.8 10*3/uL (ref 4.0–10.5)
nRBC: 0 % (ref 0.0–0.2)

## 2023-06-09 LAB — BRAIN NATRIURETIC PEPTIDE: B Natriuretic Peptide: 519.3 pg/mL — ABNORMAL HIGH (ref 0.0–100.0)

## 2023-06-09 LAB — TROPONIN I (HIGH SENSITIVITY)
Troponin I (High Sensitivity): 58 ng/L — ABNORMAL HIGH (ref <18)
Troponin I (High Sensitivity): 55 ng/L — ABNORMAL HIGH (ref <18)

## 2023-06-09 MED ORDER — AMOXICILLIN-POT CLAVULANATE 875-125 MG PO TABS
1.0000 | ORAL_TABLET | Freq: Once | ORAL | Status: AC
  Administered 2023-06-09: 1 via ORAL
  Filled 2023-06-09: qty 1

## 2023-06-09 MED ORDER — DEXAMETHASONE 4 MG PO TABS
6.0000 mg | ORAL_TABLET | Freq: Once | ORAL | Status: AC
  Administered 2023-06-09: 6 mg via ORAL
  Filled 2023-06-09: qty 2

## 2023-06-09 MED ORDER — DOXYCYCLINE HYCLATE 50 MG PO CAPS: 100.0000 mg | ORAL_CAPSULE | Freq: Two times a day (BID) | ORAL | 0 refills | Status: AC

## 2023-06-09 MED ORDER — DOXYCYCLINE HYCLATE 100 MG PO TABS
100.0000 mg | ORAL_TABLET | Freq: Once | ORAL | Status: AC
  Administered 2023-06-09: 100 mg via ORAL
  Filled 2023-06-09: qty 1

## 2023-06-09 MED ORDER — IPRATROPIUM-ALBUTEROL 0.5-2.5 (3) MG/3ML IN SOLN
3.0000 mL | Freq: Once | RESPIRATORY_TRACT | Status: AC
  Administered 2023-06-09: 3 mL via RESPIRATORY_TRACT
  Filled 2023-06-09: qty 3

## 2023-06-09 MED ORDER — AMOXICILLIN-POT CLAVULANATE 875-125 MG PO TABS: 1.0000 | ORAL_TABLET | Freq: Two times a day (BID) | ORAL | 0 refills | Status: AC

## 2023-06-09 NOTE — ED Provider Notes (Signed)
[facility] Provider Note    Event Date/Time   First MD Initiated Contact with Patient 06/09/23 0900     (approximate)   History   Shortness of Breath and Chest Pain   HPI  Gabriel [name]. is a 52 y.o. 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  A couple weeks ago had a cold and productive cough that resolved mostly and then shortness of breath recurrent cough and chest pain central nonradiating nonexertional started 2 days ago.  Denies any GI or GU complaints.  Has used his albuterol inhaler without much relief at home.  He has been fully compliant with his anticoagulation.   External Medical Documents Reviewed: Echo performed July 2024 with global hypokinesis, EF of 45 to 50%      Physical Exam   Triage Vital Signs: ED Triage Vitals  Encounter Vitals Group     BP 06/09/23 0732 (!) 176/103     Systolic BP Percentile --      Diastolic BP Percentile --      Pulse Rate 06/09/23 0732 96     Resp 06/09/23 0732 (!) 22     Temp 06/09/23 0732 97.9 F (36.6 C)     Temp src --      SpO2 06/09/23 0732 97 %     Weight 06/09/23 0731 250 lb (113.4 kg)     Height 06/09/23 0731 5\' 11"  (1.803 m)     Head Circumference --      Peak Flow --      Pain Score 06/09/23 0731 8     Pain Loc --      Pain Education --      Exclude from Growth Chart --     Most recent vital signs: Vitals:   06/09/23 0732  BP: (!) 176/103  Pulse: 96  Resp: (!) 22  Temp: 97.9 F (36.6 C)  SpO2: 97%    General: Awake, no distress.  CV:  Good peripheral perfusion.  Resp:  Normal effort.  Abd:  No distention.  Other:  Tachypneic, hypertensive speaking full sentences no respiratory distress.  Afebrile.  Lung sounds without focality rales or wheezing.   ED Results / Procedures /  Treatments   Labs (all labs ordered are listed, but only abnormal results are displayed) Labs Reviewed  BASIC METABOLIC PANEL - Abnormal; Notable for the following components:      Result Value   Glucose, Bld 175 (*)    BUN 22 (*)    All other components within normal limits  TROPONIN I (HIGH SENSITIVITY) - Abnormal; Notable for the following components:   Troponin I (High Sensitivity) 58 (*)    All other components within normal limits  CBC  BRAIN NATRIURETIC PEPTIDE  TROPONIN I (HIGH SENSITIVITY)     I ordered and reviewed the above labs they are notable for initial troponin is 58, near baseline.  EKG  ED ECG REPORT I, Pilar Jarvis, the attending physician, personally viewed and interpreted this ECG.   Date: 06/09/2023  EKG Time: 0732  Rate: 98  Rhythm: sinus  Axis: nl  Intervals: long QTC  ST&T Change: no stemi    RADIOLOGY I independently reviewed and interpreted chest x-ray and right-sided midlung opacity concerning for pneumonia I also reviewed radiologist's formal read.   PROCEDURES:  Critical Care performed: No  Procedures   MEDICATIONS ORDERED IN ED: Medications  amoxicillin-clavulanate (AUGMENTIN) 875-125 MG per tablet 1 tablet (1 tablet Oral Given 06/09/23 0923)  doxycycline (VIBRA-TABS) tablet 100 mg (100 mg Oral Given 06/09/23 0922)  ipratropium-albuterol (DUONEB) 0.5-2.5 (3) MG/3ML nebulizer solution 3 mL (3 mLs Nebulization Given 06/09/23 0923)  dexamethasone (DECADRON) tablet 6 mg (6 mg Oral Given 06/09/23 1610)    IMPRESSION / MDM / ASSESSMENT AND PLAN / ED COURSE  I reviewed the triage vital signs and the nursing notes.                                Patient's presentation is most consistent with acute presentation with potential threat to life or bodily function.  Differential diagnosis includes, but is not limited to, viral URI, bacterial pneumonia, CHF exacerbation, COPD exacerbation, considered but less likely ACS or PE   The  patient is on the cardiac monitor to evaluate for evidence of arrhythmia and/or significant heart rate changes.  MDM:    Double sickening with recent cold and cough now with worsening chest pain shortness of breath and recurrent cough concerning for bacterial pneumonia especially in light of chest x-ray with consolidation in the right middle lung.  Will treat with antibiotics oral, no signs of sepsis at this time I think outpatient treatment appropriate terms of the pneumonia -- CURB 65 score low risk.  Initial troponin in the 50s not far from his baseline and EKG without ischemic changes, given his CAD will trend troponin for signs of severe demand ischemia to determine disposition.  Will treat with DuoNebs/dexamethasone given his history of COPD in case component of shortness of breath due to exacerbation of COPD.  No signs of fluid overload doubt CHF at this time.         FINAL CLINICAL IMPRESSION(S) / ED DIAGNOSES   Final diagnoses:  Atypical chest pain  Pneumonia of right middle lobe due to infectious organism     Rx / DC Orders   ED Discharge Orders          Ordered    amoxicillin-clavulanate (AUGMENTIN) 875-125 MG tablet  2 times daily        06/09/23 0953    doxycycline (VIBRAMYCIN) 50 MG capsule  2 times daily        06/09/23 9604             Note:  This document was prepared using Dragon voice recognition software and may include unintentional dictation errors.    Pilar Jarvis, MD 06/09/23 [phone]

## 2023-06-09 NOTE — Discharge Instructions (Addendum)
Take antibiotics for pneumonia for the full 5-day course as prescribed.  Take your albuterol breathing treatment every 4-6 hours as needed for shortness of breath. Take an extra dose of your torsemide every other day for one week and call your cardiologist for further guidance moving forward because there may have been a little extra fluid in your lungs.   Take acetaminophen 650 mg and ibuprofen 400 mg every 6 hours for pain.  Take with food.   Thank you for choosing [place] for your health care today!  Please see your primary doctor this week for a follow up appointment.   If you have any new, worsening, or unexpected symptoms call your doctor right away or come back to the emergency department for reevaluation.  It was my pleasure to care for you today.   Daneil Dan Modesto Charon, MD

## 2023-06-09 NOTE — ED Triage Notes (Signed)
Pt ambulatory to emergency room for SOB and central, constant chest pain that started Saturday. States BP has been high as well. Takes Xerelto.

## 2023-06-16 ENCOUNTER — Inpatient Hospital Stay: Payer: Medicaid Other

## 2023-06-16 ENCOUNTER — Inpatient Hospital Stay (HOSPITAL_BASED_OUTPATIENT_CLINIC_OR_DEPARTMENT_OTHER): Payer: Medicaid Other | Admitting: Nurse Practitioner

## 2023-06-16 ENCOUNTER — Encounter: Payer: Self-pay | Admitting: Nurse Practitioner

## 2023-06-16 VITALS — BP 132/74 | HR 88 | Temp 96.6°F | Resp 17 | Wt 248.7 lb

## 2023-06-16 VITALS — BP 125/83 | HR 96

## 2023-06-16 DIAGNOSIS — I5042 Chronic combined systolic (congestive) and diastolic (congestive) heart failure: Secondary | ICD-10-CM | POA: Diagnosis not present

## 2023-06-16 DIAGNOSIS — Z86711 Personal history of pulmonary embolism: Secondary | ICD-10-CM | POA: Diagnosis not present

## 2023-06-16 DIAGNOSIS — D751 Secondary polycythemia: Secondary | ICD-10-CM

## 2023-06-16 DIAGNOSIS — R5383 Other fatigue: Secondary | ICD-10-CM | POA: Diagnosis not present

## 2023-06-16 DIAGNOSIS — Z87891 Personal history of nicotine dependence: Secondary | ICD-10-CM | POA: Diagnosis not present

## 2023-06-16 DIAGNOSIS — Z86718 Personal history of other venous thrombosis and embolism: Secondary | ICD-10-CM | POA: Diagnosis not present

## 2023-06-16 DIAGNOSIS — G4733 Obstructive sleep apnea (adult) (pediatric): Secondary | ICD-10-CM | POA: Diagnosis not present

## 2023-06-16 LAB — CBC WITH DIFFERENTIAL/PLATELET
Abs Immature Granulocytes: 0.08 10*3/uL — ABNORMAL HIGH (ref 0.00–0.07)
Basophils Absolute: 0.1 10*3/uL (ref 0.0–0.1)
Basophils Relative: 1 %
Eosinophils Absolute: 0.2 10*3/uL (ref 0.0–0.5)
Eosinophils Relative: 2 %
HCT: 52.2 % — ABNORMAL HIGH (ref 39.0–52.0)
Hemoglobin: 17.3 g/dL — ABNORMAL HIGH (ref 13.0–17.0)
Immature Granulocytes: 1 %
Lymphocytes Relative: 27 %
Lymphs Abs: 2 10*3/uL (ref 0.7–4.0)
MCH: 28.1 pg (ref 26.0–34.0)
MCHC: 33.1 g/dL (ref 30.0–36.0)
MCV: 84.7 fL (ref 80.0–100.0)
Monocytes Absolute: 0.5 10*3/uL (ref 0.1–1.0)
Monocytes Relative: 6 %
Neutro Abs: 4.7 10*3/uL (ref 1.7–7.7)
Neutrophils Relative %: 63 %
Platelets: 214 10*3/uL (ref 150–400)
RBC: 6.16 MIL/uL — ABNORMAL HIGH (ref 4.22–5.81)
RDW: 14.5 % (ref 11.5–15.5)
WBC: 7.5 10*3/uL (ref 4.0–10.5)
nRBC: 0 % (ref 0.0–0.2)

## 2023-06-16 LAB — IRON AND TIBC
Iron: 89 ug/dL (ref 45–182)
Saturation Ratios: 21 % (ref 17.9–39.5)
TIBC: 434 ug/dL (ref 250–450)
UIBC: 345 ug/dL

## 2023-06-16 LAB — COMPREHENSIVE METABOLIC PANEL
ALT: 23 U/L (ref 0–44)
AST: 18 U/L (ref 15–41)
Albumin: 4.2 g/dL (ref 3.5–5.0)
Alkaline Phosphatase: 62 U/L (ref 38–126)
Anion gap: 9 (ref 5–15)
BUN: 24 mg/dL — ABNORMAL HIGH (ref 6–20)
CO2: 24 mmol/L (ref 22–32)
Calcium: 9 mg/dL (ref 8.9–10.3)
Chloride: 102 mmol/L (ref 98–111)
Creatinine, Ser: 1.41 mg/dL — ABNORMAL HIGH (ref 0.61–1.24)
GFR, Estimated: 59 mL/min — ABNORMAL LOW (ref >60)
Glucose, Bld: 278 mg/dL — ABNORMAL HIGH (ref 70–99)
Potassium: 4.5 mmol/L (ref 3.5–5.1)
Sodium: 135 mmol/L (ref 135–145)
Total Bilirubin: 1.5 mg/dL — ABNORMAL HIGH (ref 0.3–1.2)
Total Protein: 7.6 g/dL (ref 6.5–8.1)

## 2023-06-16 LAB — FERRITIN: Ferritin: 70 ng/mL (ref 24–336)

## 2023-06-16 NOTE — Addendum Note (Signed)
Addended by: Ashley Royalty A on: 06/16/2023 02:40 PM   Modules accepted: Orders

## 2023-06-16 NOTE — Progress Notes (Signed)
[facility] CONSULT NOTE  Patient Care Team: Jacky Kindle, FNP as PCP - General (Family Medicine) Mariah Milling Tollie Pizza, MD as PCP - Cardiology (Cardiology) Lanier Prude, MD as PCP - Electrophysiology (Cardiology) Delma Freeze, FNP as Nurse Practitioner (Family Medicine) Antonieta Iba, MD as Consulting Physician (Cardiology) Ronney Asters, Jackelyn Poling, RPH-CPP as Pharmacist  CHIEF COMPLAINTS/PURPOSE OF CONSULTATION: DVT/PE - Erythrocytosis  # MAY 2021-acute PE right upper lower lobe/ right lower extremity DVT-unprovoked on xarelto x stopped 2 months [cost/insurance issues]; October 2021- Right lobar Artery- on xarelto   # CAD; chronic systolic CHF (EF of 35 to 40% in October 2021; Dr.Gollan/Dr.Klein; [place]) ; HTN; DM-2 [NOV 2021-A1c- 9.8; poorly controlled]; CKD- stage-III; smoker- trying to cut down [OCT 2021];    HISTORY OF PRESENTING ILLNESS:  Gabriel [name]. 52 y.o. male with history of multiple medical problems including CAD CHF hypertension hyperlipidemia type 2 diabetes poorly controlled CKD, ARF, DVT/PE, on Xarelto, who was last seen by Dr. Donneta Romberg in 2022, now re-referred to hematology for concerns of elevated RBCs.    He is unsure of reason for referral but upon chart review appears that He was seen by rheumatology who noticed hemoglobin of 21. Patient continues to be on Xarelto. He endorses aches and pains particularly of his hands. He is a former smoker. Denies exposure to carbon monoxide. Has sleep apnea which is controlled with CPAP. He generally feels sluggish and tired.   Interval History: Patient is 52 year old male who returns to clinic for follow up of his polycythemia. He has undergone phlebotomy x 1. Tolerated well. Did not notice difference in his symptoms. In interim he was seen in ER for URI and CHF exacerbation. He received antibiotics and steroids. Improving symptoms now. Weight is stable. No fevers, chills, infections. Appetite is reduced but  weight is stable.   Review of Systems  Constitutional:  Positive for malaise/fatigue. Negative for chills, diaphoresis, fever and weight loss.  HENT:  Negative for nosebleeds and sore throat.   Eyes:  Negative for double vision.  Respiratory:  Negative for cough, hemoptysis, sputum production, shortness of breath and wheezing.   Cardiovascular:  Negative for chest pain, palpitations, orthopnea and leg swelling.  Gastrointestinal:  Negative for abdominal pain, blood in stool, constipation, diarrhea, heartburn, melena, nausea and vomiting.  Genitourinary:  Negative for dysuria, frequency and urgency.  Musculoskeletal:  Positive for back pain and joint pain.  Skin: Negative.  Negative for itching and rash.  Neurological:  Negative for dizziness, tingling, focal weakness, weakness and headaches.  Endo/Heme/Allergies:  Does not bruise/bleed easily.  Psychiatric/Behavioral:  Negative for depression. The patient is not nervous/anxious and does not have insomnia.      MEDICAL HISTORY:  Past Medical History:  Diagnosis Date   Acute on chronic combined systolic and diastolic CHF (congestive heart failure) (HCC) 09/02/2017   CAD (coronary artery disease)    a. 04/2015 low risk MV;  b. 12/2016 Cath: minor irregs in LAD/Diag/LCX/OM, RCA 40p/m/d; c. 05/2020 Cath: LM nl, LAD 50d, LCX 30p, OM1 40, RCA 100p w/ L->R collats. CO/CI 3.1/1.3-->Med rx.   Chest wall pain, chronic    Chronic Troponin Elevation    CKD (chronic kidney disease), stage II-III    COPD (chronic obstructive pulmonary disease) (HCC)    Diabetes mellitus without complication (HCC)    HFrEF (heart failure with reduced ejection fraction) (HCC)    a. 03/2015 Echo: EF 45-50%; b. 12/2015 Echo: EF 20-25%; c. 02/2016 Echo:  EF 30-35%; d. 11/2016 Echo: EF 40-45%; e. 06/2019 Echo: EF 30-35%; f. 11/2019 Echo: EF 25-30%; g. 05/2020 Echo: EF 35-40%; h. 05/2021 Echo: EF 40-45%; i. 11/2021 Echo: EF 20-25%, glob HK, mild LVH, GrIII DD, sev red RV fxn, mild MR.    Hypertension    Mitral regurgitation    Mild to moderate by October 2021 echocardiogram.   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 03/2015 Echo: EF 45-50%; b. 12/2015 Echo: EF 20-25%; c. 02/2016 Echo: EF 30-35%; d. 11/2016 Echo: EF 40-45%; e. 06/2019 Echo: EF 30-35%; f. 11/2019 Echo: EF 25-30%; g. 05/2020 Echo: EF 35-40%; h. 05/2021 Echo: EF 40-45%; i. 11/2021 Echo: EF 20-25%   Myocardial infarct (HCC)    NSVT (nonsustained ventricular tachycardia) (HCC)    a. 12/2015 noted on tele-->amio;  b. 12/2015 Event monitor: no VT noted.   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  12/31/19: RUL and RLL PEs.   Syncope    a. 01/2016 - felt to be vasovagal.    SURGICAL HISTORY: Past Surgical History:  Procedure Laterality Date   AMPUTATION     CARDIAC CATHETERIZATION     COLONOSCOPY WITH PROPOFOL N/A 08/01/2022   Procedure: COLONOSCOPY WITH PROPOFOL;  Surgeon: Toney Reil, MD;  Location: [facility] ENDOSCOPY;  Service: Gastroenterology;  Laterality: N/A;   ESOPHAGOGASTRODUODENOSCOPY (EGD) WITH PROPOFOL N/A 08/01/2022   Procedure: ESOPHAGOGASTRODUODENOSCOPY (EGD) WITH PROPOFOL;  Surgeon: Toney Reil, MD;  Location: [facility] ENDOSCOPY;  Service: Gastroenterology;  Laterality: N/A;   ESOPHAGOGASTRODUODENOSCOPY (EGD) WITH PROPOFOL N/A 10/16/2022   Procedure: ESOPHAGOGASTRODUODENOSCOPY (EGD) WITH PROPOFOL;  Surgeon: Toney Reil, MD;  Location: [facility] ENDOSCOPY;  Service: Gastroenterology;  Laterality: N/A;   FINGER AMPUTATION     Traumatic   FINGER FRACTURE SURGERY Left    FRACTURE SURGERY     LEFT HEART CATH AND CORONARY ANGIOGRAPHY N/A 01/06/2017   Procedure: Left Heart Cath and Coronary Angiography;  Surgeon: Iran Ouch, MD;  Location: [facility] INVASIVE CV LAB;  Service: Cardiovascular;  Laterality: N/A;   RIGHT/LEFT HEART CATH AND CORONARY ANGIOGRAPHY N/A 06/13/2020   Procedure: RIGHT/LEFT  HEART CATH AND CORONARY ANGIOGRAPHY;  Surgeon: Yvonne Kendall, MD;  Location: [facility] INVASIVE CV LAB;  Service: Cardiovascular;  Laterality: N/A;   SUBQ ICD IMPLANT N/A 05/20/2022   Procedure: SUBQ ICD IMPLANT;  Surgeon: Lanier Prude, MD;  Location: [facility] INVASIVE CV LAB;  Service: Cardiovascular;  Laterality: N/A;    SOCIAL HISTORY: Social History   Socioeconomic History   Marital status: Widowed    Spouse name: Not on file   Number of children: 1   Years of education: 34   Highest education level: 12th grade  Occupational History   Occupation: disabled  Tobacco Use   Smoking status: Former    Current packs/day: 0.00    Average packs/day: 0.5 packs/day for 33.0 years (16.5 ttl pk-yrs)    Types: Cigarettes    Start date: 05/09/1987    Quit date: 05/08/2020    Years since quitting: 3.1   Smokeless tobacco: Former    Quit date: 05/08/2020   Tobacco comments:    Quit Sept 2021  Vaping Use   Vaping status: Former  Substance and Sexual Activity   Alcohol use: Not Currently    Comment: occassionally   Drug use: No   Sexual activity: Not Currently  Other Topics Concern   Not on file  Social  History Narrative   Lives at home with wife and his dad's wife.        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    Social Determinants of Health   Financial Resource Strain: Medium Risk (08/05/2022)   Overall Financial Resource Strain (CARDIA)    Difficulty of Paying Living Expenses: Somewhat hard  Food Insecurity: No Food Insecurity (03/08/2023)   Hunger Vital Sign    Worried About Running Out of Food in the Last Year: Never true    Ran Out of Food in the Last Year: Never true  Transportation Needs: No Transportation Needs (03/08/2023)   PRAPARE - Administrator, Civil Service (Medical): No    Lack of Transportation (Non-Medical): No  Physical Activity: Insufficiently Active (01/20/2018)   Exercise Vital Sign     Days of Exercise per Week: 7 days    Minutes of Exercise per Session: 10 min  Stress: Stress Concern Present (09/02/2017)   [name] of Occupational Health - Occupational Stress Questionnaire    Feeling of Stress : Very much  Social Connections: Moderately Isolated (09/02/2017)   Social Connection and Isolation Panel [NHANES]    Frequency of Communication with Friends and Family: Twice a week    Frequency of Social Gatherings with Friends and Family: Once a week    Attends Religious Services: Never    Database administrator or Organizations: No    Attends Banker Meetings: Never    Marital Status: Widowed  Intimate Partner Violence: Not At Risk (03/08/2023)   Humiliation, Afraid, Rape, and Kick questionnaire    Fear of Current or Ex-Partner: No    Emotionally Abused: No    Physically Abused: No    Sexually Abused: No    FAMILY HISTORY: Family History  Problem Relation Age of Onset   Diabetes Mother    Diabetes Mellitus II Mother    Hypothyroidism Mother    Hypertension Mother    Kidney failure Mother        Dialysis   Heart attack Mother        72 yo approximately   Hypertension Father    Gout Father    Cancer Maternal Grandfather    Diabetes Maternal Grandfather    Cancer Paternal Aunt     ALLERGIES:  is allergic to metformin and related and zantac [ranitidine hcl].  MEDICATIONS:  Current Outpatient Medications  Medication Sig Dispense Refill   Accu-Chek Softclix Lancets lancets USE UP TO FOUR TIMES DAILY     albuterol (VENTOLIN HFA) 108 (90 Base) MCG/ACT inhaler Inhale 2 puffs into the lungs every 4 (four) hours as needed for wheezing or shortness of breath. 8 g 6   allopurinol (ZYLOPRIM) 300 MG tablet Take 300 mg by mouth daily.     azelastine (ASTELIN) 0.1 % nasal spray Place 2 sprays into both nostrils 2 (two) times daily. Use in each nostril as directed 30 mL 12   BD PEN NEEDLE NANO 2ND GEN 32G X 4 MM MISC USE  AS DIRECTED AT BEDTIME 200  each 0   blood glucose meter kit and supplies KIT Dispense based on patient and insurance preference. Use up to four times daily as directed. 1 each 11   buPROPion (WELLBUTRIN XL) 150 MG 24 hr tablet Take 1 tablet (150 mg total) by mouth daily. 90 tablet 3   butalbital-acetaminophen-caffeine (FIORICET) 50-325-40 MG tablet 1 tablet.  carvedilol (COREG) 6.25 MG tablet Take 2 tablets (12.5 mg total) by mouth 2 (two) times daily. Increased from 6.25 mg 2 times daily. 120 tablet 0   cetirizine (ZYRTEC) 10 MG tablet Take 1 tablet (10 mg total) by mouth daily. 30 tablet 11   colchicine 0.6 MG tablet Day 1: Take 2 tablets PO for gout flare, repeat 1 tablet one hour following initial dose. Day 2: Take 1 tablet twice daily PO for an additional 2-3 days. 60 tablet 0   Continuous Glucose Sensor (FREESTYLE LIBRE 3 SENSOR) MISC PLACE 1 SENSOR ON TO THE SKIN EVERY 14 DAYS. USE TO CHECK BLOOD SUGAR CONTINUEOUSLY.     Continuous Glucose Sensor (FREESTYLE LIBRE 3 SENSOR) MISC 1 Application by Does not apply route every 14 (fourteen) days. Use to check blood sugar continuously. 6 each 0   cyclobenzaprine (FLEXERIL) 5 MG tablet Take 1 tablet (5 mg total) by mouth as needed. 10 tablet 0   dapagliflozin propanediol (FARXIGA) 10 MG TABS tablet Take 1 tablet (10 mg total) by mouth daily. 30 tablet 5   diazepam (VALIUM) 5 MG tablet Take 1 tablet (5 mg total) by mouth 2 (two) times daily as needed (vertigo). 60 tablet 5   dicyclomine (BENTYL) 10 MG capsule Take 2 capsules (20 mg total) by mouth 4 (four) times daily -  before meals and at bedtime. 240 capsule 0   fenofibrate (TRICOR) 145 MG tablet Take 1 tablet (145 mg total) by mouth daily. 90 tablet 3   FLUoxetine (PROZAC) 20 MG capsule Take 1 capsule (20 mg total) by mouth daily. 90 capsule 3   fluticasone (FLONASE) 50 MCG/ACT nasal spray Place 2 sprays into both nostrils daily as needed. Home med.     icosapent Ethyl (VASCEPA) 1 g capsule Take 2 capsules (2 g total) by  mouth 2 (two) times daily. 360 capsule 1   insulin aspart (NOVOLOG) 100 UNIT/ML FlexPen Inject 10 Units into the skin 3 (three) times daily with meals. Inject 10 units + correction before breakfast/lunch and supper (correction = 1 unit per 50 mg/dL above 725); max daily dose 36 units 15 mL 3   Insulin Glargine (BASAGLAR KWIKPEN) 100 UNIT/ML Inject 18 Units into the skin at bedtime. Reduced from 38 units. 15 mL 3   insulin glargine-yfgn (SEMGLEE) 100 UNIT/ML injection Inject into the skin.     Insulin Pen Needle 32G X 4 MM MISC 1 each by Does not apply route in the morning, at noon, in the evening, and at bedtime. 120 each 11   meclizine (ANTIVERT) 25 MG tablet Take 1 tablet (25 mg total) by mouth 3 (three) times daily as needed for dizziness. 60 tablet 1   metoCLOPramide (REGLAN) 5 MG tablet Take 1 tablet (5 mg total) by mouth every 8 (eight) hours as needed for nausea. 30 tablet 0   naltrexone (DEPADE) 50 MG tablet Take 1 tablet (50 mg total) by mouth daily. 30 tablet 2   nitroGLYCERIN (NITROSTAT) 0.4 MG SL tablet Place 1 tablet (0.4 mg total) under the tongue every 5 (five) minutes x 3 doses as needed for chest pain. 30 tablet 0   omeprazole (PRILOSEC) 40 MG capsule Take 1 capsule by mouth once daily 90 capsule 0   OZEMPIC, 2 MG/DOSE, 8 MG/3ML SOPN INJECT 2MG  SUBCUTANEOUSLY ONCE A WEEK 9 mL 0   Peppermint Oil (IBGARD PO) Take 1 tablet by mouth daily.     potassium chloride SA (KLOR-CON M) 20 MEQ tablet Take 1 tablet (20  mEq total) by mouth every other day. 15 tablet 6   predniSONE (DELTASONE) 20 MG tablet Take 1 tablet (20 mg total) by mouth daily. 10 tablet 0   Resmetirom (REZDIFFRA) 100 MG TABS Take by mouth.     REZDIFFRA 100 MG TABS Take by mouth.     rivaroxaban (XARELTO) 20 MG TABS tablet Take 1 tablet (20 mg total) by mouth daily with supper. 90 tablet 3   rosuvastatin (CRESTOR) 20 MG tablet Take 1 tablet (20 mg total) by mouth daily. 90 tablet 3   sacubitril-valsartan (ENTRESTO) 97-103 MG  Hold until followup with cardiology due to acute kidney injury. (Patient taking differently: Take 1 tablet by mouth 2 (two) times daily. Hold until followup with cardiology due to acute kidney injury.)     spironolactone (ALDACTONE) 25 MG tablet Hold until followup with cardiology due to acute kidney injury. (Patient taking differently: Take 25 mg by mouth daily. Hold until followup with cardiology due to acute kidney injury.)     TALTZ 80 MG/ML SOAJ Inject 3 mLs into the skin every 14 (fourteen) days.     torsemide (DEMADEX) 20 MG tablet Take 2 tablets (40 mg total) by mouth daily. 180 tablet 3   umeclidinium-vilanterol (ANORO ELLIPTA) 62.5-25 MCG/ACT AEPB Inhale 1 puff into the lungs daily. 30 each 12   No current facility-administered medications for this visit.    PHYSICAL EXAMINATION: Vitals:   06/16/23 1324  BP: 132/74  Pulse: 88  Resp: 17  Temp: (!) 96.6 F (35.9 C)  SpO2: 96%   Filed Weights   06/16/23 1324  Weight: 248 lb 11.2 oz (112.8 kg)   Physical Exam Constitutional:      Appearance: He is obese. He is not ill-appearing.     Comments: Alone.  Ambulating independently.  HENT:     Head: Normocephalic and atraumatic.  Eyes:     General: No scleral icterus. Cardiovascular:     Rate and Rhythm: Normal rate and regular rhythm.  Pulmonary:     Effort: No respiratory distress.  Abdominal:     General: There is no distension.     Palpations: Abdomen is soft.     Tenderness: There is no abdominal tenderness.  Musculoskeletal:     Comments: Ambulates w/o aids  Skin:    General: Skin is warm.     Comments: Complexion appears less ruddy  Neurological:     Mental Status: He is alert and oriented to person, place, and time.  Psychiatric:        Mood and Affect: Mood and affect normal.        Behavior: Behavior normal.    LABORATORY DATA:  I have reviewed the data as listed Lab Results  Component Value Date   WBC 7.5 06/16/2023   HGB 17.3 (H) 06/16/2023   HCT  52.2 (H) 06/16/2023   MCV 84.7 06/16/2023   PLT 214 06/16/2023   Recent Labs    03/07/23 1758 03/08/23 0353 03/09/23 0413 05/26/23 1107 06/09/23 0745 06/16/23 1311  NA  --  139   < > 136 138 135  K  --  3.3*   < > 3.7 3.9 4.5  CL  --  103   < > 105 105 102  CO2  --  25   < > 22 24 24   GLUCOSE  --  186*   < > 293* 175* 278*  BUN  --  35*   < > 18 22* 24*  CREATININE  --  2.07*   < > 1.04 1.14 1.41*  CALCIUM  --  8.6*   < > 8.7* 9.0 9.0  GFRNONAA  --  37*   < > >60 >60 59*  PROT 6.9 6.6  --  7.4  --  7.6  ALBUMIN 4.0 3.8  --  4.1  --  4.2  AST 20 17  --  25  --  18  ALT 29 28  --  30  --  23  ALKPHOS 58 60  --  62  --  62  BILITOT 1.2 1.4*  --  1.0  --  1.5*  BILIDIR 0.1  --   --   --   --   --   IBILI 1.1*  --   --   --   --   --    < > = values in this interval not displayed.   Iron/TIBC/Ferritin/ %Sat    Component Value Date/Time   IRON 77 05/26/2023 1108   TIBC 392 05/26/2023 1108   FERRITIN 77 05/26/2023 1108   IRONPCTSAT 20 05/26/2023 1108     RADIOGRAPHIC STUDIES: I have personally reviewed the radiological images as listed and agreed with the findings in the report. DG Chest 2 View  Result Date: 06/09/2023 CLINICAL DATA:  Chest pain EXAM: CHEST - 2 VIEW COMPARISON:  Chest radiograph 03/07/2023 FINDINGS: The cardiac device lead projecting over the anterior chest wall to the left of midline is unchanged. The heart is mildly enlarged, unchanged. The upper mediastinal contours are stable. There are new patchy opacities in the medial right lower lobe. Linear opacity in the left midlung is unchanged likely reflecting scar. There is no other focal airspace consolidation. There is no definite overt pulmonary edema. There is no significant pleural effusion. There is no pneumothorax. There is no acute osseous abnormality. IMPRESSION: Probable right middle/lower lobe pneumonia. Recommend follow-up radiographs in 6-8 weeks to ensure resolution. Electronically Signed   By:  Lesia Hausen M.D.   On: 06/09/2023 08:39   CUP PACEART REMOTE DEVICE CHECK  Result Date: 05/21/2023 Scheduled remote reviewed. Normal device function.  Next remote 91 days. LA, CVRSSensing Configuration: Secondary Gain Setting: 1X Post Shock Pacing: OFF    ASSESSMENT & PLAN:   Erythrocytosis/Polycythemia- December 2019 (hemoglobin 17.8 and has had intermittent elevated readings since that time. 2023 more consistently elevated readings. Hemoglobin at Rheumatology was 21.2. Hematocrit 64.9.). EPO was elevated. JAK2 negative. BCR/ABL FISH negative. CO, ferritin, iron levels normal. Non-smoker. No testosterone use. 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 Today, hemoglobin 17.3, hct 52.2.  we reviewed that secondary polycythemia does not carry the same level of thrombotic risk as primary but I would recommend continuing aspirin 81 mg daily. Plan to continue therapeutic phlebotomy with goal of hct < 50.   # Thrombocytopenia- Plt 160. Normal today. Monitor.   # Pulmonary Embolism - on right. Unprovoked PE May 2021. Was on eliquis but noncompliance secondary to cost & insurance. September 2021- acute PE. On xarelto, reports compliance. Managed by Cardiology. Had previously recommended switching to eliquis 5 mg twice daily given renal failure but defer to cardiology.   # CHF- recent exacerbation. on diuretics. Managed by cardiology.   # Diabetes- bg 278 today. Defer to pcp.   # Abnormal bilirubin- total bili 1.5. Asymptomatic. Monitor.   Disposition:  Phlebotomy today 3 months- lab (cbc, cmp), establish care with MD, +/- phlebotomy- la  No problem-specific Assessment &  Plan notes found for this encounter.  All questions were answered. The patient knows to call the clinic with any problems, questions or concerns.   Alinda Dooms, NP 06/16/2023

## 2023-06-19 ENCOUNTER — Other Ambulatory Visit: Payer: Self-pay | Admitting: Gastroenterology

## 2023-06-23 ENCOUNTER — Ambulatory Visit (HOSPITAL_COMMUNITY)
Admission: RE | Admit: 2023-06-23 | Discharge: 2023-06-23 | Disposition: A | Discharge status: Home / Self Care | Payer: Medicaid Other | Source: Ambulatory Visit | Attending: Student | Admitting: Student

## 2023-06-23 DIAGNOSIS — R9089 Other abnormal findings on diagnostic imaging of central nervous system: Secondary | ICD-10-CM | POA: Diagnosis not present

## 2023-06-23 DIAGNOSIS — R42 Dizziness and giddiness: Secondary | ICD-10-CM | POA: Diagnosis not present

## 2023-06-23 MED ORDER — GADOBUTROL 1 MMOL/ML IV SOLN
10.0000 mL | Freq: Once | INTRAVENOUS | Status: AC | PRN
Start: 2023-06-23 — End: 2023-06-23
  Administered 2023-06-23: 10 mL via INTRAVENOUS

## 2023-06-25 DIAGNOSIS — M67814 Other specified disorders of tendon, left shoulder: Secondary | ICD-10-CM | POA: Diagnosis not present

## 2023-06-25 DIAGNOSIS — M19012 Primary osteoarthritis, left shoulder: Secondary | ICD-10-CM | POA: Diagnosis not present

## 2023-06-25 DIAGNOSIS — M778 Other enthesopathies, not elsewhere classified: Secondary | ICD-10-CM | POA: Diagnosis not present

## 2023-06-25 DIAGNOSIS — M25512 Pain in left shoulder: Secondary | ICD-10-CM | POA: Diagnosis not present

## 2023-06-25 DIAGNOSIS — M75112 Incomplete rotator cuff tear or rupture of left shoulder, not specified as traumatic: Secondary | ICD-10-CM | POA: Diagnosis not present

## 2023-06-26 ENCOUNTER — Telehealth: Payer: Self-pay | Admitting: Family Medicine

## 2023-06-26 DIAGNOSIS — E1169 Type 2 diabetes mellitus with other specified complication: Secondary | ICD-10-CM

## 2023-06-26 NOTE — Telephone Encounter (Signed)
[facility] faxed refill request for the following medications:   Insulin Glargine (BASAGLAR KWIKPEN) 100 UNIT/ML    Please advise.

## 2023-06-27 MED ORDER — BASAGLAR KWIKPEN 100 UNIT/ML ~~LOC~~ SOPN: 18.0000 [IU] | PEN_INJECTOR | Freq: Every evening | SUBCUTANEOUS | Status: DC

## 2023-06-27 NOTE — Telephone Encounter (Signed)
refilled 

## 2023-07-01 ENCOUNTER — Other Ambulatory Visit: Payer: Self-pay | Admitting: Internal Medicine

## 2023-07-01 ENCOUNTER — Telehealth: Payer: Self-pay | Admitting: Family

## 2023-07-01 DIAGNOSIS — I5023 Acute on chronic systolic (congestive) heart failure: Secondary | ICD-10-CM

## 2023-07-01 MED ORDER — DAPAGLIFLOZIN PROPANEDIOL 10 MG PO TABS: 10.0000 mg | ORAL_TABLET | Freq: Every day | ORAL | 3 refills | Status: DC

## 2023-07-01 MED ORDER — RIVAROXABAN 20 MG PO TABS: 20.0000 mg | ORAL_TABLET | Freq: Every day | ORAL | 3 refills | Status: AC

## 2023-07-01 NOTE — Progress Notes (Signed)
Medication refills sent for Gabriel [name], and Xarelto.

## 2023-07-02 ENCOUNTER — Other Ambulatory Visit (HOSPITAL_COMMUNITY): Payer: Self-pay

## 2023-07-03 ENCOUNTER — Other Ambulatory Visit: Payer: Self-pay

## 2023-07-03 ENCOUNTER — Telehealth: Payer: Self-pay | Admitting: Family Medicine

## 2023-07-03 ENCOUNTER — Ambulatory Visit: Payer: Medicaid Other | Attending: Neurology

## 2023-07-03 DIAGNOSIS — R42 Dizziness and giddiness: Secondary | ICD-10-CM | POA: Diagnosis not present

## 2023-07-03 DIAGNOSIS — E1169 Type 2 diabetes mellitus with other specified complication: Secondary | ICD-10-CM

## 2023-07-03 NOTE — Telephone Encounter (Signed)
[facility] is asking for refills on Basaglar 100 unit inj

## 2023-07-03 NOTE — Therapy (Signed)
OUTPATIENT PHYSICAL THERAPY VESTIBULAR EVALUATION     Patient Name: Gabriel [name]. MRN: [id number] DOB:May 31, 1971, 52 y.o., male Today's Date: 07/03/2023  END OF SESSION:  PT End of Session - 07/03/23 0830     Visit Number 1    Number of Visits 1    Date for PT Re-Evaluation 07/04/23    Authorization Type [employer]    Authorization Time Period auth req after 27th PT visit this calender year    PT Start Time 0835    PT Stop Time 0915    PT Time Calculation (min) 40 min    Activity Tolerance Patient tolerated treatment well    Behavior During Therapy [facility] for tasks assessed/performed             Past Medical History:  Diagnosis Date   Acute on chronic combined systolic and diastolic CHF (congestive heart failure) (HCC) 09/02/2017   CAD (coronary artery disease)    a. 04/2015 low risk MV;  b. 12/2016 Cath: minor irregs in LAD/Diag/LCX/OM, RCA 40p/m/d; c. 05/2020 Cath: LM nl, LAD 50d, LCX 30p, OM1 40, RCA 100p w/ L->R collats. CO/CI 3.1/1.3-->Med rx.   Chest wall pain, chronic    Chronic Troponin Elevation    CKD (chronic kidney disease), stage II-III    COPD (chronic obstructive pulmonary disease) (HCC)    Diabetes mellitus without complication (HCC)    HFrEF (heart failure with reduced ejection fraction) (HCC)    a. 03/2015 Echo: EF 45-50%; b. 12/2015 Echo: EF 20-25%; c. 02/2016 Echo: EF 30-35%; d. 11/2016 Echo: EF 40-45%; e. 06/2019 Echo: EF 30-35%; f. 11/2019 Echo: EF 25-30%; g. 05/2020 Echo: EF 35-40%; h. 05/2021 Echo: EF 40-45%; i. 11/2021 Echo: EF 20-25%, glob HK, mild LVH, GrIII DD, sev red RV fxn, mild MR.   Hypertension    Mitral regurgitation    Mild to moderate by October 2021 echocardiogram.   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 03/2015 Echo: EF 45-50%; b. 12/2015 Echo: EF 20-25%; c. 02/2016 Echo: EF 30-35%; d. 11/2016 Echo: EF 40-45%; e. 06/2019 Echo: EF 30-35%; f. 11/2019 Echo: EF 25-30%; g. 05/2020 Echo: EF 35-40%; h. 05/2021  Echo: EF 40-45%; i. 11/2021 Echo: EF 20-25%   Myocardial infarct (HCC)    NSVT (nonsustained ventricular tachycardia) (HCC)    a. 12/2015 noted on tele-->amio;  b. 12/2015 Event monitor: no VT noted.   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  12/31/19: RUL and RLL PEs.   Syncope    a. 01/2016 - felt to be vasovagal.   Past Surgical History:  Procedure Laterality Date   AMPUTATION     CARDIAC CATHETERIZATION     COLONOSCOPY WITH PROPOFOL N/A 08/01/2022   Procedure: COLONOSCOPY WITH PROPOFOL;  Surgeon: Toney Reil, MD;  Location: [facility] ENDOSCOPY;  Service: Gastroenterology;  Laterality: N/A;   ESOPHAGOGASTRODUODENOSCOPY (EGD) WITH PROPOFOL N/A 08/01/2022   Procedure: ESOPHAGOGASTRODUODENOSCOPY (EGD) WITH PROPOFOL;  Surgeon: Toney Reil, MD;  Location: [facility] ENDOSCOPY;  Service: Gastroenterology;  Laterality: N/A;   ESOPHAGOGASTRODUODENOSCOPY (EGD) WITH PROPOFOL N/A 10/16/2022   Procedure: ESOPHAGOGASTRODUODENOSCOPY (EGD) WITH PROPOFOL;  Surgeon: Toney Reil, MD;  Location: [facility] ENDOSCOPY;  Service: Gastroenterology;  Laterality: N/A;   FINGER AMPUTATION     Traumatic   FINGER FRACTURE SURGERY Left    FRACTURE SURGERY     LEFT HEART CATH AND CORONARY ANGIOGRAPHY N/A 01/06/2017   Procedure:  Left Heart Cath and Coronary Angiography;  Surgeon: Iran Ouch, MD;  Location: [facility] INVASIVE CV LAB;  Service: Cardiovascular;  Laterality: N/A;   RIGHT/LEFT HEART CATH AND CORONARY ANGIOGRAPHY N/A 06/13/2020   Procedure: RIGHT/LEFT HEART CATH AND CORONARY ANGIOGRAPHY;  Surgeon: Yvonne Kendall, MD;  Location: [facility] INVASIVE CV LAB;  Service: Cardiovascular;  Laterality: N/A;   SUBQ ICD IMPLANT N/A 05/20/2022   Procedure: SUBQ ICD IMPLANT;  Surgeon: Lanier Prude, MD;  Location: [facility] INVASIVE CV LAB;  Service: Cardiovascular;  Laterality: N/A;   Patient Active Problem List   Diagnosis Date Noted    Erythrocytosis 05/26/2023   CPAP (continuous positive airway pressure) dependence 03/08/2023   Chronic 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 02/07/2023   Gastric erythema 10/16/2022   Psoriatic arthritis of multiple joints (HCC) 09/17/2022   Polyp of descending colon 08/01/2022   ICD (implantable cardioverter-defibrillator) in place 05/31/2022   Chronic obstructive pulmonary disease (COPD) (HCC) 05/14/2022   Acute nontraumatic kidney injury (HCC) 03/26/2022   Depression, recurrent (HCC) 02/08/2022   Annual physical exam 11/05/2021   Hyperlipidemia associated with type 2 diabetes mellitus (HCC) 09/05/2021   Long term current use of anticoagulant 05/20/2021   Mitral regurgitation 05/20/2021   Mediastinal lymphadenopathy 05/20/2021   Atherosclerotic heart disease of native coronary artery with other forms of angina pectoris (HCC)    AKI (acute kidney injury) (HCC) 11/24/2020   OSA on CPAP 11/23/2020   Type II diabetes mellitus with renal manifestations (HCC) 06/11/2020   COPD (chronic obstructive pulmonary disease) (HCC) 06/11/2020   Acute on chronic HFrEF (heart failure with reduced ejection fraction) (HCC) 05/12/2020   History of pulmonary embolism    Type 2 diabetes mellitus with hyperlipidemia (HCC) 06/26/2019   Acute on chronic systolic CHF (congestive heart failure) (HCC) 11/28/2016   Coronary artery disease, non-occlusive 03/15/2016   Chronic systolic CHF (congestive heart failure) (HCC) 01/23/2016   NICM (nonischemic cardiomyopathy) (HCC) 01/17/2016   NSVT (nonsustained ventricular tachycardia) (HCC)     PCP: Jacky Kindle, FNP REFERRING PROVIDER: Morene Crocker, MD  REFERRING DIAG: R42 (ICD-10-CM) - Dizziness and giddiness  THERAPY DIAG:  Dizziness and giddiness  ONSET DATE: 06/27/2023 (Referral Date) - Chronic Dizziness  Rationale for Evaluation and Treatment: Rehabilitation  SUBJECTIVE:   SUBJECTIVE STATEMENT: Patient reports some mild dizziness currently. Reports ENT  thought it was not peripheral. Reports that he has episodes where he gets very lightheaded or feels like he is going to fall if he is standing. Lasts approx 30 seconds to minutes. No specific movements he knows of that stirs it up. Does have some intermittent spinning sensation. Reports headaches that fluctuate, currently has a migraine, 10/10. Reports currently taking meclizine for the dizziness.  Pt accompanied by: self  PERTINENT HISTORY: Patient with chronic dizziness. ED visit in Sept 2023 d/t dizziness and black out spells, with other ED visits due to dizziness. ENT did not feel that dizziness is related to peripheral cause. Patient verbalizes ongoing dizziness, can be spontaneous or occur with positional changes. Does have history of migraines, with some associated photophobia/phonophobia and nausea. PMH of CKD stage III, HFrEF, cardiomyopathy, hypertension, OSA, hyperlipidemia, type 2 diabetes, migraine, anxiety and depression, recurrent pulmonary embolism on Xarelto.  PAIN:  Are you having pain?  10/10; migraine.   PRECAUTIONS: None  RED FLAGS: None   WEIGHT BEARING RESTRICTIONS: No  FALLS: Has patient fallen in last 6 months? No  PLOF: Independent  PATIENT GOALS: Get rid of dizziness  OBJECTIVE:  Note: Objective measures  were completed at Evaluation unless otherwise noted.  DIAGNOSTIC FINDINGS: MRI NOV - IMPRESSION: 1.  No evidence of an acute intracranial abnormality. 2. No cerebellopontine angle or internal auditory canal mass. 3. 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 4. Mild generalized cerebral volume loss. 5. Incidentally noted 8 mm subcutaneous forehead lipoma.  COGNITION: Overall cognitive status: Within functional limits for tasks assessed   SENSATION: WFL  STRENGTH: WFL  CERVICAL ROM: NORMAL  LOWER EXTREMITY MMT:   MMT Right eval Left eval  Hip flexion    Hip abduction    Hip adduction    Hip internal rotation    Hip external  rotation    Knee flexion    Knee extension    Ankle dorsiflexion    Ankle plantarflexion    Ankle inversion    Ankle eversion    (Blank rows = not tested)  BED MOBILITY:  Sit to supine Complete Independence Supine to sit Complete Independence  TRANSFERS: Assistive device utilized: None  Sit to stand: Complete Independence Stand to sit: Complete Independence  GAIT: Gait pattern: WFL Distance walked: into/out of clinic Assistive device utilized: None Level of assistance: Complete Independence Comments: no unsteadiness noted; increased time upon standing due to dizziness   PATIENT SURVEYS:  FOTO DPS: and DFS:   VESTIBULAR ASSESSMENT:    SYMPTOM BEHAVIOR:  Subjective history: see above   Non-Vestibular symptoms: changes in vision, headaches, nausea/vomiting, and migraine symptoms  Type of dizziness: Blurred Vision, Imbalance (Disequilibrium), Spinning/Vertigo, and Lightheadedness/Faint  Frequency: 3-4x/week  Duration: seconds to minutes  Aggravating factors: Spontaneous  Relieving factors: rest  Progression of symptoms: worse  OCULOMOTOR EXAM:  Ocular Alignment: normal  Ocular ROM: No Limitations  Spontaneous Nystagmus: absent  Gaze-Induced Nystagmus: absent  Smooth Pursuits: intact  Saccades: hypometric/undershoots  Convergence/Divergence: 3 cm  VESTIBULAR - OCULAR REFLEX:   Slow VOR: Normal  VOR Cancellation: Normal  Head-Impulse Test: HIT Right: negative HIT Left: negative  Dynamic Visual Acuity: Static: 7 Dynamic: 5   POSITIONAL TESTING: Right Dix-Hallpike: no nystagmus Left Dix-Hallpike: no nystagmus Right Roll Test: no nystagmus Left Roll Test: no nystagmus  MOTION SENSITIVITY:  Motion Sensitivity Quotient Intensity: 0 = none, 1 = Lightheaded, 2 = Mild, 3 = Moderate, 4 = Severe, 5 = Vomiting  Intensity  1. Sitting to supine 1  2. Supine to L side 0  3. Supine to R side 0  4. Supine to sitting 1  5. L Hallpike-Dix 1  6. Up from L  1  7. R  Hallpike-Dix 1  8. Up from R  1  9. Sitting, head tipped to L knee   10. Head up from L knee   11. Sitting, head tipped to R knee   12. Head up from R knee   13. Sitting head turns x5   14.Sitting head nods x5   15. In stance, 180 turn to L    16. In stance, 180 turn to R     OTHOSTATICS: not done   PATIENT EDUCATION: Education details: Educated on Eval; No Peripheral Vestibular Findings noted on evaluation. Potential for Vestibular Component to Migraines. Educated on follow up with Neurologist regarding headache/migraine management, as well as Cardiologist follow up due to lightheadedness/syncope.  Person educated: Patient Education method: Explanation Education comprehension: verbalized understanding  HOME EXERCISE PROGRAM:  GOALS: NO GOALS WARRANTED DUE TO NO FOLLOW UP VISITS NEEDED  ASSESSMENT:  CLINICAL IMPRESSION: Patient is a 52 y.o. male referred to OPPT services for Dizziness. Patient's  PMH significant for the following: CKD stage III, HFrEF, cardiomyopathy, hypertension, OSA, hyperlipidemia, type 2 diabetes, migraine, anxiety and depression, recurrent pulmonary embolism on Xarelto . Upon evaluation, patient presents with no significant findings to indicate peripheral vestibular impairment. Negative Positional Testing, Normal Oculomotor Exam, Negative HIT Bilaterally and Normal VOR. Normal gait and balance noted on evaluation. Due to frequency and intensity of migraines/headache, believe will benefit from speaking with neurologist for further guidance for medication management.  Patient does not demonstrate need for skilled PT services at this time. Will discharge.    OBJECTIVE IMPAIRMENTS: dizziness.   ACTIVITY LIMITATIONS: standing  PARTICIPATION LIMITATIONS: driving and community activity  PERSONAL FACTORS: Time since onset of injury/illness/exacerbation and 3+ comorbidities: CKD stage III, HFrEF, cardiomyopathy, hypertension, OSA, hyperlipidemia, type 2 diabetes,  migraine, anxiety and depression, recurrent pulmonary embolism on Xarelto  are also affecting patient's functional outcome.   CLINICAL DECISION MAKING: Stable/uncomplicated  EVALUATION COMPLEXITY: Low   PLAN FOR NEXT SESSION: d/c this visit   Howie Ill, PT, DPT 07/03/23 9:16 AM

## 2023-07-07 ENCOUNTER — Encounter: Payer: Self-pay | Admitting: Family

## 2023-07-07 ENCOUNTER — Other Ambulatory Visit
Admission: RE | Admit: 2023-07-07 | Discharge: 2023-07-07 | Disposition: A | Discharge status: Home / Self Care | Payer: Medicaid Other | Source: Ambulatory Visit | Attending: Family | Admitting: Family

## 2023-07-07 ENCOUNTER — Ambulatory Visit (HOSPITAL_BASED_OUTPATIENT_CLINIC_OR_DEPARTMENT_OTHER): Payer: Medicaid Other | Admitting: Family

## 2023-07-07 VITALS — BP 151/91 | HR 86 | Wt 252.4 lb

## 2023-07-07 DIAGNOSIS — E1122 Type 2 diabetes mellitus with diabetic chronic kidney disease: Secondary | ICD-10-CM

## 2023-07-07 DIAGNOSIS — I5022 Chronic systolic (congestive) heart failure: Secondary | ICD-10-CM

## 2023-07-07 DIAGNOSIS — N1832 Chronic kidney disease, stage 3b: Secondary | ICD-10-CM | POA: Diagnosis not present

## 2023-07-07 DIAGNOSIS — J449 Chronic obstructive pulmonary disease, unspecified: Secondary | ICD-10-CM | POA: Diagnosis not present

## 2023-07-07 DIAGNOSIS — I10 Essential (primary) hypertension: Secondary | ICD-10-CM

## 2023-07-07 DIAGNOSIS — Z794 Long term (current) use of insulin: Secondary | ICD-10-CM | POA: Diagnosis not present

## 2023-07-07 DIAGNOSIS — I25118 Atherosclerotic heart disease of native coronary artery with other forms of angina pectoris: Secondary | ICD-10-CM | POA: Diagnosis not present

## 2023-07-07 DIAGNOSIS — I2699 Other pulmonary embolism without acute cor pulmonale: Secondary | ICD-10-CM

## 2023-07-07 DIAGNOSIS — R42 Dizziness and giddiness: Secondary | ICD-10-CM | POA: Diagnosis not present

## 2023-07-07 DIAGNOSIS — G4733 Obstructive sleep apnea (adult) (pediatric): Secondary | ICD-10-CM | POA: Diagnosis not present

## 2023-07-07 LAB — BASIC METABOLIC PANEL
Anion gap: 9 (ref 5–15)
BUN: 16 mg/dL (ref 6–20)
CO2: 24 mmol/L (ref 22–32)
Calcium: 9.2 mg/dL (ref 8.9–10.3)
Chloride: 105 mmol/L (ref 98–111)
Creatinine, Ser: 1.16 mg/dL (ref 0.61–1.24)
GFR, Estimated: >60 mL/min (ref >60)
Glucose, Bld: 163 mg/dL — ABNORMAL HIGH (ref 70–99)
Potassium: 4.9 mmol/L (ref 3.5–5.1)
Sodium: 138 mmol/L (ref 135–145)

## 2023-07-07 MED ORDER — SPIRONOLACTONE 25 MG PO TABS: 25.0000 mg | ORAL_TABLET | Freq: Every day | ORAL | 3 refills | Status: DC

## 2023-07-07 MED ORDER — DAPAGLIFLOZIN PROPANEDIOL 10 MG PO TABS: 10.0000 mg | ORAL_TABLET | Freq: Every day | ORAL | 3 refills | Status: AC

## 2023-07-07 MED ORDER — ENTRESTO 97-103 MG PO TABS: 1.0000 | ORAL_TABLET | Freq: Two times a day (BID) | ORAL | 11 refills | Status: DC

## 2023-07-07 NOTE — Patient Instructions (Signed)
Go over to the [facility]. Go pass the gift shop and have your blood work completed.

## 2023-07-07 NOTE — Progress Notes (Signed)
PCP: Merita Norton, FNP (last seen 08/24) Primary Cardiologist: Julien Nordmann, MD (last seen 07/24) HF cardiologist: Arvilla Meres, MD (last seen 09/24)  HPI:  Mr Eastin is a 52 y/o male with a history of HTN, T2DM, chronic chest pain, chronic troponin elevation, COPD, CKD, asthma, NSVT, OSA, unprovoked PE, pulmonary HTN, psoriasis, migraines, cirrhosis from NASH, ICD placement (10/23), previous tobacco use and chronic heart failure.   Admitted 09/2021 & 11/2021 with HF. Admitted 05/20/22 for ICD placement and discharged the next day. Was in the ED 12/21/22 due to dizziness & chest pain. Was in the ED 06/09/23 due to SOB/ chest pain for previous 2 days. CXR concerning for pneumonia so antibiotics provided. Initial troponin in the 50's which is close to his baseline. EKG without ischemic changes.   Echo 11/28/16: EF of 40-45% along with mild MR. EF has improved from 30-35% July 2017. Echo 04/29/17: EF 45-50%.  Echo 06/26/2019: EF of 30-35% along with moderately elevated PA pressure.  Echo 11/23/19: EF of 25-30% with mild LVH and moderately elevated PA Pressure.  Echo 06/08/20: EF of 35-40% along with mildly elevated PA pressure of 44.7 mmHg, moderate LAE and mild/moderate MR.  Echo 05/21/21: EF of 40-45% along with mild LVH and mild MR. Echo 05/21/21: EF of 40-45% along with mild LVH and mild MR.  Echo 11/24/21: EF of 20-25% along with mild LVH and mild MR.  Echo 02/06/22: EF of 30-35% along with mild/moderate AR.  Echo 08/14/22: EF 40-45% with mild LVH, Grade I DD, severe hypokinesis of left ventricular, entire inferior and inferolateral wall.  Echo 03/08/23: EF 45-50% with moderate LVH, trivial MR, mild dilatation of aortic root at 43 mm  RHC/LHC done 06/13/20 and showed: Severe single-vessel coronary artery disease with chronic total occlusion of the proximal RCA.  The distal vessel fills via left to right collaterals. Mild to moderate, nonobstructive CAD involving the LAD and LCx with up to 50%  stenosis. Mildly elevated left and right heart filling pressures. Moderate pulmonary hypertension (mean PAP 40 mmHg) with significantly elevated pulmonary vascular resistance (PVR 7.4 WU). Severely reduced Fick cardiac output/index.   He presents today for a HF follow-up visit with a chief complaint of moderate shortness of breath with minimal exertion. Chronic in nature. Has shortness of breath, chronic chest pain, chronic dizziness, HA (last 2-3 months), productive cough and intermittent abdominal distention along with this. Denies palpitations or difficulty sleeping. Weight at home has ranged from 245-250 pounds.Generally takes 40mg  torsemide daily with additional 40mg  PRN but says that he gets terrible leg cramps when he takes the 80mg  dose.   Has been out of farxiga for ~ 3 weeks as he needed refills. Says that was told a refill was sent but when [facility] went to [facility] to get it, he was told they hadn't gotten a RX. Explained that it showed a confirmation from [facility] that the farxiga had been received last week.   Sees pulm tomorrow. Does wear CPAP usually every night unless he's feeling very congested or coughing. Has upcoming sleep study 07/21/23.  Has had a frontal headache pretty consistently for the last 2-3 months. Intermittent blurry vision with occasional nausea. Bright lights make the headache worse. Had migraines as a child/ young adult and he says that this feels similar to those. Has upcoming PCP appointment later this week.   ROS: All systems negative except as listed in HPI, PMH and Problem List.  SH:  Social History   Socioeconomic History   Marital status:  Widowed    Spouse name: Not on file   Number of children: 1   Years of education: 80   Highest education level: 12th grade  Occupational History   Occupation: disabled  Tobacco Use   Smoking status: Former    Current packs/day: 0.00    Average packs/day: 0.5 packs/day for 33.0 years (16.5 ttl pk-yrs)    Types:  Cigarettes    Start date: 05/09/1987    Quit date: 05/08/2020    Years since quitting: 3.1   Smokeless tobacco: Former    Quit date: 05/08/2020   Tobacco comments:    Quit Sept 2021  Vaping Use   Vaping status: Former  Substance and Sexual Activity   Alcohol use: Not Currently    Comment: occassionally   Drug use: No   Sexual activity: Not Currently  Other Topics Concern   Not on file  Social History Narrative   Lives at home with wife and his dad's wife.        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    Social Determinants of Health   Financial Resource Strain: Medium Risk (08/05/2022)   Overall Financial Resource Strain (CARDIA)    Difficulty of Paying Living Expenses: Somewhat hard  Food Insecurity: No Food Insecurity (03/08/2023)   Hunger Vital Sign    Worried About Running Out of Food in the Last Year: Never true    Ran Out of Food in the Last Year: Never true  Transportation Needs: No Transportation Needs (03/08/2023)   PRAPARE - Administrator, Civil Service (Medical): No    Lack of Transportation (Non-Medical): No  Physical Activity: Insufficiently Active (01/20/2018)   Exercise Vital Sign    Days of Exercise per Week: 7 days    Minutes of Exercise per Session: 10 min  Stress: Stress Concern Present (09/02/2017)   [name] of Occupational Health - Occupational Stress Questionnaire    Feeling of Stress : Very much  Social Connections: Moderately Isolated (09/02/2017)   Social Connection and Isolation Panel [NHANES]    Frequency of Communication with Friends and Family: Twice a week    Frequency of Social Gatherings with Friends and Family: Once a week    Attends Religious Services: Never    Database administrator or Organizations: No    Attends Banker Meetings: Never    Marital Status: Widowed  Intimate Partner Violence: Not At Risk (03/08/2023)   Humiliation, Afraid,  Rape, and Kick questionnaire    Fear of Current or Ex-Partner: No    Emotionally Abused: No    Physically Abused: No    Sexually Abused: No    FH:  Family History  Problem Relation Age of Onset   Diabetes Mother    Diabetes Mellitus II Mother    Hypothyroidism Mother    Hypertension Mother    Kidney failure Mother        Dialysis   Heart attack Mother        54 yo approximately   Hypertension Father    Gout Father    Cancer Maternal Grandfather    Diabetes Maternal Grandfather    Cancer Paternal Aunt     Past Medical History:  Diagnosis Date   Acute on chronic combined systolic and diastolic CHF (congestive heart failure) (HCC) 09/02/2017   CAD (coronary artery disease)    a. 04/2015 low risk MV;  b. 12/2016 Cath: minor irregs  in LAD/Diag/LCX/OM, RCA 40p/m/d; c. 05/2020 Cath: LM nl, LAD 50d, LCX 30p, OM1 40, RCA 100p w/ L->R collats. CO/CI 3.1/1.3-->Med rx.   Chest wall pain, chronic    Chronic Troponin Elevation    CKD (chronic kidney disease), stage II-III    COPD (chronic obstructive pulmonary disease) (HCC)    Diabetes mellitus without complication (HCC)    HFrEF (heart failure with reduced ejection fraction) (HCC)    a. 03/2015 Echo: EF 45-50%; b. 12/2015 Echo: EF 20-25%; c. 02/2016 Echo: EF 30-35%; d. 11/2016 Echo: EF 40-45%; e. 06/2019 Echo: EF 30-35%; f. 11/2019 Echo: EF 25-30%; g. 05/2020 Echo: EF 35-40%; h. 05/2021 Echo: EF 40-45%; i. 11/2021 Echo: EF 20-25%, glob HK, mild LVH, GrIII DD, sev red RV fxn, mild MR.   Hypertension    Mitral regurgitation    Mild to moderate by October 2021 echocardiogram.   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 03/2015 Echo: EF 45-50%; b. 12/2015 Echo: EF 20-25%; c. 02/2016 Echo: EF 30-35%; d. 11/2016 Echo: EF 40-45%; e. 06/2019 Echo: EF 30-35%; f. 11/2019 Echo: EF 25-30%; g. 05/2020 Echo: EF 35-40%; h. 05/2021 Echo: EF 40-45%; i. 11/2021 Echo: EF 20-25%   Myocardial infarct (HCC)    NSVT (nonsustained ventricular  tachycardia) (HCC)    a. 12/2015 noted on tele-->amio;  b. 12/2015 Event monitor: no VT noted.   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  12/31/19: RUL and RLL PEs.   Syncope    a. 01/2016 - felt to be vasovagal.    Current Outpatient Medications  Medication Sig Dispense Refill   Accu-Chek Softclix Lancets lancets USE UP TO FOUR TIMES DAILY     albuterol (VENTOLIN HFA) 108 (90 Base) MCG/ACT inhaler Inhale 2 puffs into the lungs every 4 (four) hours as needed for wheezing or shortness of breath. 8 g 6   allopurinol (ZYLOPRIM) 300 MG tablet Take 300 mg by mouth daily.     azelastine (ASTELIN) 0.1 % nasal spray Place 2 sprays into both nostrils 2 (two) times daily. Use in each nostril as directed 30 mL 12   BD PEN NEEDLE NANO 2ND GEN 32G X 4 MM MISC USE  AS DIRECTED AT BEDTIME 200 each 0   blood glucose meter kit and supplies KIT Dispense based on patient and insurance preference. Use up to four times daily as directed. 1 each 11   buPROPion (WELLBUTRIN XL) 150 MG 24 hr tablet Take 1 tablet (150 mg total) by mouth daily. 90 tablet 3   butalbital-acetaminophen-caffeine (FIORICET) 50-325-40 MG tablet 1 tablet.     carvedilol (COREG) 6.25 MG tablet Take 2 tablets (12.5 mg total) by mouth 2 (two) times daily. Increased from 6.25 mg 2 times daily. 120 tablet 0   cetirizine (ZYRTEC) 10 MG tablet Take 1 tablet (10 mg total) by mouth daily. 30 tablet 11   colchicine 0.6 MG tablet Day 1: Take 2 tablets PO for gout flare, repeat 1 tablet one hour following initial dose. Day 2: Take 1 tablet twice daily PO for an additional 2-3 days. 60 tablet 0   Continuous Glucose Sensor (FREESTYLE LIBRE 3 SENSOR) MISC PLACE 1 SENSOR ON TO THE SKIN EVERY 14 DAYS. USE TO CHECK BLOOD SUGAR CONTINUEOUSLY.     Continuous Glucose Sensor (FREESTYLE LIBRE 3 SENSOR) MISC 1 Application by Does not apply route every 14 (fourteen) days. Use to check blood  sugar continuously. 6 each 0   cyclobenzaprine (FLEXERIL) 5 MG tablet Take 1 tablet (5 mg total) by mouth as needed. 10 tablet 0   dapagliflozin propanediol (FARXIGA) 10 MG TABS tablet Take 1 tablet (10 mg total) by mouth daily. 90 tablet 3   diazepam (VALIUM) 5 MG tablet Take 1 tablet (5 mg total) by mouth 2 (two) times daily as needed (vertigo). 60 tablet 5   dicyclomine (BENTYL) 10 MG capsule Take 2 capsules (20 mg total) by mouth 4 (four) times daily -  before meals and at bedtime. 240 capsule 0   fenofibrate (TRICOR) 145 MG tablet Take 1 tablet (145 mg total) by mouth daily. 90 tablet 3   FLUoxetine (PROZAC) 20 MG capsule Take 1 capsule (20 mg total) by mouth daily. 90 capsule 3   fluticasone (FLONASE) 50 MCG/ACT nasal spray Place 2 sprays into both nostrils daily as needed. Home med.     icosapent Ethyl (VASCEPA) 1 g capsule Take 2 capsules (2 g total) by mouth 2 (two) times daily. 360 capsule 1   insulin aspart (NOVOLOG) 100 UNIT/ML FlexPen Inject 10 Units into the skin 3 (three) times daily with meals. Inject 10 units + correction before breakfast/lunch and supper (correction = 1 unit per 50 mg/dL above 161); max daily dose 36 units 15 mL 3   Insulin Glargine (BASAGLAR KWIKPEN) 100 UNIT/ML Inject 18 Units into the skin at bedtime. Reduced from 38 units.     insulin glargine-yfgn (SEMGLEE) 100 UNIT/ML injection Inject into the skin.     Insulin Pen Needle 32G X 4 MM MISC 1 each by Does not apply route in the morning, at noon, in the evening, and at bedtime. 120 each 11   meclizine (ANTIVERT) 25 MG tablet Take 1 tablet (25 mg total) by mouth 3 (three) times daily as needed for dizziness. 60 tablet 1   metoCLOPramide (REGLAN) 5 MG tablet Take 1 tablet (5 mg total) by mouth every 8 (eight) hours as needed for nausea. 30 tablet 0   naltrexone (DEPADE) 50 MG tablet Take 1 tablet (50 mg total) by mouth daily. 30 tablet 2   nitroGLYCERIN (NITROSTAT) 0.4 MG SL tablet Place 1 tablet (0.4 mg total)  under the tongue every 5 (five) minutes x 3 doses as needed for chest pain. 30 tablet 0   omeprazole (PRILOSEC) 40 MG capsule Take 1 capsule by mouth once daily 90 capsule 1   OZEMPIC, 2 MG/DOSE, 8 MG/3ML SOPN INJECT 2MG  SUBCUTANEOUSLY ONCE A WEEK 9 mL 0   Peppermint Oil (IBGARD PO) Take 1 tablet by mouth daily.     potassium chloride SA (KLOR-CON M) 20 MEQ tablet Take 1 tablet (20 mEq total) by mouth every other day. 15 tablet 6   predniSONE (DELTASONE) 20 MG tablet Take 1 tablet (20 mg total) by mouth daily. 10 tablet 0   Resmetirom (REZDIFFRA) 100 MG TABS Take by mouth.     REZDIFFRA 100 MG TABS Take by mouth.     rivaroxaban (XARELTO) 20 MG TABS tablet Take 1 tablet (20 mg total) by mouth daily with supper. 90 tablet 3   rosuvastatin (CRESTOR) 20 MG tablet Take 1 tablet (20 mg total) by mouth daily. 90 tablet 3   sacubitril-valsartan (ENTRESTO) 97-103 MG Take 1 tablet by mouth 2 (two) times daily. Hold until followup with cardiology due to acute kidney injury. 60 tablet 5   spironolactone (ALDACTONE) 25 MG tablet Hold until followup with cardiology due to acute kidney injury. (Patient  taking differently: Take 25 mg by mouth daily. Hold until followup with cardiology due to acute kidney injury.)     TALTZ 80 MG/ML SOAJ Inject 3 mLs into the skin every 14 (fourteen) days.     torsemide (DEMADEX) 20 MG tablet Take 2 tablets (40 mg total) by mouth daily. 180 tablet 3   umeclidinium-vilanterol (ANORO ELLIPTA) 62.5-25 MCG/ACT AEPB Inhale 1 puff into the lungs daily. 30 each 12   No current facility-administered medications for this visit.   Vitals:   07/07/23 0829  BP: (!) 151/91  Pulse: 86  SpO2: 99%  Weight: 252 lb 6 oz (114.5 kg)   Wt Readings from Last 3 Encounters:  07/07/23 252 lb 6 oz (114.5 kg)  06/16/23 248 lb 11.2 oz (112.8 kg)  06/09/23 250 lb (113.4 kg)   Lab Results  Component Value Date   CREATININE 1.16 07/07/2023   CREATININE 1.41 (H) 06/16/2023   CREATININE 1.14  06/09/2023   PHYSICAL EXAM:  General:  Well appearing although uncomfortable due to headache. No resp difficulty HEENT: normal Neck: supple. JVP flat. No lymphadenopathy or thryomegaly appreciated. Cor: PMI normal. Regular rate & rhythm. No rubs, gallops or murmurs. Lungs: clear Abdomen: soft, nontender, nondistended. No hepatosplenomegaly. No bruits or masses.  Extremities: no cyanosis, clubbing, rash, edema Neuro: alert & oriented x3, cranial nerves grossly intact. Moves all 4 extremities w/o difficulty. Affect pleasant.   ECG: not done  Reds: unable to obtain reading due to "low volume"; same message both times   ASSESSMENT & PLAN:  1.  Chronic HFrEF/Mixed Ischemic and nonischemic cardiomyopathy:   - Mixed ICM/NICM - NYHA class III - s/p [place] [employer]q-ICD  - weighing daily; reminded to call for an overnight weight gain of > 2 pounds or a weekly weight gain of > 5 pounds - weight up 2 pounds from last visit here 2 months ago - Echo 11/28/16: EF of 40-45% along with mild MR. EF has improved from 30-35% July 2017. - Echo 04/29/17: EF 45-50%.  - Echo 06/26/2019: EF of 30-35% along with moderately elevated PA pressure.  - Echo 11/23/19: EF of 25-30% with mild LVH and moderately elevated PA Pressure.  - Echo 06/08/20: EF of 35-40% along with mildly elevated PA pressure of 44.7 mmHg, moderate LAE and mild/moderate MR.  - Echo 05/21/21: EF of 40-45% along with mild LVH and mild MR. - Echo 05/21/21: EF of 40-45% along with mild LVH and mild MR.  - Echo 11/24/21: EF of 20-25% along with mild LVH and mild MR.  - Echo 02/06/22: EF of 30-35% along with mild/moderate AR.  - Echo 08/14/22: EF 40-45% with mild LVH, Grade I DD, severe hypokinesis of left ventricular, entire inferior and inferolateral wall.  - Echo 03/08/23: EF 45-50% with moderate LVH, trivial MR, mild dilatation of aortic root at 43 mm - continue Entresto 97/103 bid - resume [place] 10mg  daily - continue spiro 25 daily - continue  carvedilol 12.5mg  BID - continue torsemide 40mg  daily with additional 40mg  PRN - continue potassium QOD - continue to use Furoscix if weight >= 250 - BMET today - consider Cardiomems to help volume management as needed -> his insurance won't cover - saw EP Lalla Brothers) 01/24 - ICD placed on 05/20/22 - saw [facility] provider (Bensimhon) 09/24 - BNP 06/09/23 was 519.3   2.  CAD/Elevated Troponin and chronic CP:  - H/o CAD  - RHC/LHC done 10/21 and showed:  Severe single-vessel coronary artery disease with chronic total occlusion  of the proximal RCA.  The distal vessel fills via left to right collaterals.  Mild to moderate, nonobstructive CAD involving the LAD and LCx with up to 50% stenosis.  Mildly elevated left and right heart filling pressures.  Moderate pulmonary hypertension (mean PAP 40 mmHg) with significantly elevated pulmonary vascular resistance (PVR 7.4 WU).  Severely reduced Fick cardiac output/index. - Stable angina - continue rosuvastatin 20mg  daily - saw cardiology Mariah Milling) 07/24   3.  HTN:  - BP  - saw PCP Suzie Portela) 08/24 - BMP 06/16/23 showed sodium 135, potassium 4.5, creatinine 1.41 & GFR 59 - recheck BMET today   4.  H/o PE/Chronic anticoagulation:  - continue xarelto 20mg  daily - no recent bleeding   5.  DM2- - saw nephrology (Korrapati) 08/23  - A1c 04/16/23 was 6.4%   6. OSA- - Sleep study 3/22: AHI 28  - sleep study 12/23; AHI 45.1 - wearing CPAP nightly unless feeling very congested - saw pulmonology (Dgayli) 09/24; returns tomorrow  7: COPD- - stopped smoking 2021 - had smoked for close to 40 years with starting at the age of 45 - at his peak, he was smoking 2-2.5 ppd - saw pulmonology (Dgayli) 09/24  8: Dizziness/ HA- - meclizine helping some but dizziness continues - saw neurology Marlene Bast) 10/24 - thyroid panel 01/17/23 was normal - discuss HA further with PCP later this week   Return in 3 months, sooner if needed.

## 2023-07-07 NOTE — Progress Notes (Signed)
Reds vest. Low quality times two. Unable to obtain.

## 2023-07-08 ENCOUNTER — Telehealth: Payer: Self-pay | Admitting: Family Medicine

## 2023-07-08 ENCOUNTER — Ambulatory Visit
Admission: RE | Admit: 2023-07-08 | Discharge: 2023-07-08 | Disposition: A | Discharge status: Home / Self Care | Payer: Medicaid Other | Source: Ambulatory Visit | Attending: Student in an Organized Health Care Education/Training Program | Admitting: Student in an Organized Health Care Education/Training Program

## 2023-07-08 ENCOUNTER — Ambulatory Visit: Payer: Medicaid Other

## 2023-07-08 ENCOUNTER — Other Ambulatory Visit: Payer: Self-pay

## 2023-07-08 ENCOUNTER — Other Ambulatory Visit
Admission: RE | Admit: 2023-07-08 | Discharge: 2023-07-08 | Disposition: A | Discharge status: Home / Self Care | Payer: Medicaid Other | Source: Ambulatory Visit | Attending: Student in an Organized Health Care Education/Training Program | Admitting: Student in an Organized Health Care Education/Training Program

## 2023-07-08 ENCOUNTER — Ambulatory Visit (INDEPENDENT_AMBULATORY_CARE_PROVIDER_SITE_OTHER): Payer: Medicaid Other | Admitting: Student in an Organized Health Care Education/Training Program

## 2023-07-08 ENCOUNTER — Encounter: Payer: Self-pay | Admitting: Student in an Organized Health Care Education/Training Program

## 2023-07-08 VITALS — BP 126/80 | HR 84 | Temp 97.6°F | Ht 71.0 in | Wt 250.0 lb

## 2023-07-08 DIAGNOSIS — J449 Chronic obstructive pulmonary disease, unspecified: Secondary | ICD-10-CM | POA: Diagnosis not present

## 2023-07-08 DIAGNOSIS — I5023 Acute on chronic systolic (congestive) heart failure: Secondary | ICD-10-CM

## 2023-07-08 DIAGNOSIS — G4733 Obstructive sleep apnea (adult) (pediatric): Secondary | ICD-10-CM

## 2023-07-08 DIAGNOSIS — I2699 Other pulmonary embolism without acute cor pulmonale: Secondary | ICD-10-CM | POA: Diagnosis not present

## 2023-07-08 DIAGNOSIS — R0602 Shortness of breath: Secondary | ICD-10-CM | POA: Insufficient documentation

## 2023-07-08 DIAGNOSIS — E1169 Type 2 diabetes mellitus with other specified complication: Secondary | ICD-10-CM

## 2023-07-08 DIAGNOSIS — I272 Pulmonary hypertension, unspecified: Secondary | ICD-10-CM

## 2023-07-08 DIAGNOSIS — I428 Other cardiomyopathies: Secondary | ICD-10-CM | POA: Diagnosis not present

## 2023-07-08 LAB — CBC WITH DIFFERENTIAL/PLATELET
Abs Immature Granulocytes: 0.06 10*3/uL (ref 0.00–0.07)
Basophils Absolute: 0.1 10*3/uL (ref 0.0–0.1)
Basophils Relative: 1 %
Eosinophils Absolute: 0.2 10*3/uL (ref 0.0–0.5)
Eosinophils Relative: 2 %
HCT: 52.5 % — ABNORMAL HIGH (ref 39.0–52.0)
Hemoglobin: 17 g/dL (ref 13.0–17.0)
Immature Granulocytes: 1 %
Lymphocytes Relative: 32 %
Lymphs Abs: 2.5 10*3/uL (ref 0.7–4.0)
MCH: 26.9 pg (ref 26.0–34.0)
MCHC: 32.4 g/dL (ref 30.0–36.0)
MCV: 83.2 fL (ref 80.0–100.0)
Monocytes Absolute: 0.6 10*3/uL (ref 0.1–1.0)
Monocytes Relative: 7 %
Neutro Abs: 4.5 10*3/uL (ref 1.7–7.7)
Neutrophils Relative %: 57 %
Platelets: 191 10*3/uL (ref 150–400)
RBC: 6.31 MIL/uL — ABNORMAL HIGH (ref 4.22–5.81)
RDW: 14 % (ref 11.5–15.5)
WBC: 8 10*3/uL (ref 4.0–10.5)
nRBC: 0 % (ref 0.0–0.2)

## 2023-07-08 LAB — BASIC METABOLIC PANEL
Anion gap: 11 (ref 5–15)
BUN: 20 mg/dL (ref 6–20)
CO2: 25 mmol/L (ref 22–32)
Calcium: 9.3 mg/dL (ref 8.9–10.3)
Chloride: 102 mmol/L (ref 98–111)
Creatinine, Ser: 1.22 mg/dL (ref 0.61–1.24)
GFR, Estimated: >60 mL/min (ref >60)
Glucose, Bld: 183 mg/dL — ABNORMAL HIGH (ref 70–99)
Potassium: 4.3 mmol/L (ref 3.5–5.1)
Sodium: 138 mmol/L (ref 135–145)

## 2023-07-08 LAB — BRAIN NATRIURETIC PEPTIDE: B Natriuretic Peptide: 342.1 pg/mL — ABNORMAL HIGH (ref 0.0–100.0)

## 2023-07-08 MED ORDER — BASAGLAR KWIKPEN 100 UNIT/ML ~~LOC~~ SOPN: 18.0000 [IU] | PEN_INJECTOR | Freq: Every evening | SUBCUTANEOUS | 11 refills | Status: DC

## 2023-07-08 NOTE — Telephone Encounter (Signed)
2nd  request :   [facility] on [address] is asking for refills on Basaglar for patient. Original request was 07/03/23.

## 2023-07-08 NOTE — Progress Notes (Signed)
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  He also has a history of OSA currently maintained with BiPAP.  He continues to have exertional dyspnea and is currently also reporting symptoms of dizziness.  I reviewed his imaging from his recent ED visit with chest x-ray notable for increased interstitial markings as well as potential fluid in the fissure which suggests to me a component of pulmonary edema as well as heart failure.  His BNP was markedly elevated during that ED presentation but this could simply be chronically elevated secondary to sacubitril.  I have also reviewed his pulmonary function testing which essentially shows a mild decrease in DLCO and otherwise with normal spirometry.  There is a mild response to bronchodilator challenge and we have initiated him on LAMA/LABA therapy without much improvement in symptoms.  Today, I have repeated his chest x-ray which appears improved compared to October, with continued presence of interstitial markings.  CBC, BMP, and BNP are reassuring.  The BNP level, while still elevated (expected with [facility]) is lower than it was in October.  We have obtained his blood pressure at rest as well as on standing and he does not show signs of orthostasis to explain his dizziness and shortness of breath.  He is known to have heart failure with reduced ejection fraction on goal-directed medical therapy with Entresto, carvedilol, and torsemide.  His last right heart cath was in 2021 showing PA of 57/28 (40), PCWP 17, Fick CO/CI of 3.1/1.3 with PVR of 7.4.  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  This is likely mostly driven by group 2 pulmonary hypertension with group 3 contribution  from his severe sleep apnea.  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  He could potentially have Group 1 physiology secondary to longstanding group 2 and group 3 pulmonary hypertension.  He is on Ixekizumab which is an interleukin-17 inhibitor use for the treatment of psoriasis.  There are case reports of Ixekizumab induced interstitial lung disease (with NSIP as underlying histopathology).  I do not suspect that this is the case given his symptoms predate all of this and the fact that he has had a CT scan in September that did not show signs of ILD.  Nonetheless, I will obtain a high-resolution chest CT to rule this out.  I have discussed all of this with the patient and explained that his overall pulmonary workup is reassuring without any red flags.  I suspect his symptoms are driven by chronotropic or inotropic insufficiency secondary to his heart failure.  Finally, patient will follow-up with our sleep specialist for management of his complex sleep disorder.  - DG Chest 2 View; Future - B Nat Peptide; Future - CBC w/Diff; Future - Basic metabolic panel; Future - High-resolution chest CT - Continue Rivaroxaban  Return in about 6 months (around 01/05/2024).  I spent 65 minutes caring for this patient today, including preparing to see the patient, obtaining a medical history , reviewing a separately obtained history, performing a medically appropriate examination and/or evaluation, counseling and educating the patient/family/caregiver, ordering medications, tests, or procedures, documenting clinical information in the electronic health record, independently interpreting results (not separately  reported/billed) and communicating results to the patient/family/caregiver, and care coordination (not separately reported/billed).  This visit as part of longitudinal management of this patient's chronic medical conditions.  Gabriel Chute, MD [facility] 07/08/2023 1:10 PM    End of visit medications:  No orders of the defined types were placed in this encounter.    Current Outpatient Medications:    Accu-Chek Softclix Lancets lancets, USE UP TO FOUR TIMES DAILY, Disp: , Rfl:    albuterol (VENTOLIN HFA) 108 (90 Base) MCG/ACT inhaler, Inhale 2 puffs into the lungs every 4 (four) hours as needed for wheezing or shortness of breath., Disp: 8 g, Rfl: 6   allopurinol (ZYLOPRIM) 300 MG tablet, Take 300 mg by mouth daily., Disp: , Rfl:    azelastine (ASTELIN) 0.1 % nasal spray, Place 2 sprays into both nostrils 2 (two) times daily. Use in each nostril as directed, Disp: 30 mL, Rfl: 12   BD PEN NEEDLE NANO 2ND GEN 32G X 4 MM MISC, USE  AS DIRECTED AT BEDTIME, Disp: 200 each, Rfl: 0   blood glucose meter kit and supplies KIT, Dispense based on patient and insurance preference. Use up to four times daily as directed., Disp: 1 each, Rfl: 11   buPROPion (WELLBUTRIN XL) 150 MG 24 hr tablet, Take 1 tablet (150 mg total) by mouth daily., Disp: 90 tablet, Rfl: 3   butalbital-acetaminophen-caffeine (FIORICET) 50-325-40 MG tablet, 1 tablet., Disp: , Rfl:    carvedilol (COREG) 6.25 MG tablet, Take 2 tablets (12.5 mg total) by mouth 2 (two) times daily. Increased from 6.25 mg 2 times daily., Disp: 120 tablet, Rfl: 0   cetirizine (ZYRTEC) 10 MG tablet, Take 1 tablet (10 mg total) by mouth daily., Disp: 30 tablet, Rfl: 11   colchicine 0.6 MG tablet, Day 1: Take 2 tablets PO for gout flare, repeat 1 tablet one hour following initial dose. Day 2: Take 1 tablet twice daily PO for an additional 2-3 days., Disp: 60 tablet, Rfl: 0   Continuous Glucose Sensor (FREESTYLE LIBRE 3 SENSOR) MISC, PLACE 1 SENSOR ON TO THE SKIN EVERY 14 DAYS. USE TO CHECK BLOOD SUGAR CONTINUEOUSLY., Disp: , Rfl:    Continuous Glucose Sensor (FREESTYLE LIBRE 3 SENSOR) MISC, 1 Application by Does not apply route every 14 (fourteen) days. Use to check blood sugar continuously., Disp:  6 each, Rfl: 0   cyclobenzaprine (FLEXERIL) 5 MG tablet, Take 1 tablet (5 mg total) by mouth as needed., Disp: 10 tablet, Rfl: 0   dapagliflozin propanediol (FARXIGA) 10 MG TABS tablet, Take 1 tablet (10 mg total) by mouth daily., Disp: 90 tablet, Rfl: 3   dicyclomine (BENTYL) 10 MG capsule, Take 2 capsules (20 mg total) by mouth 4 (four) times daily -  before meals and at bedtime., Disp: 240 capsule, Rfl: 0   fenofibrate (TRICOR) 145 MG tablet, Take 1 tablet (145 mg total) by mouth daily., Disp: 90 tablet, Rfl: 3   FLUoxetine (PROZAC) 20 MG capsule, Take 1 capsule (20 mg total) by mouth daily., Disp: 90 capsule, Rfl: 3   fluticasone (FLONASE) 50 MCG/ACT nasal spray, Place 2 sprays into both nostrils daily as needed. Home med., Disp: , Rfl:    icosapent Ethyl (VASCEPA) 1 g capsule, Take 2 capsules (2 g total) by mouth 2 (two) times daily., Disp: 360 capsule, Rfl: 1   insulin aspart (NOVOLOG) 100 UNIT/ML FlexPen, Inject 10 Units into the skin 3 (three) times daily with meals. Inject 10 units + correction before breakfast/lunch and  supper (correction = 1 unit per 50 mg/dL above 161); max daily dose 36 units, Disp: 15 mL, Rfl: 3   Insulin Glargine (BASAGLAR KWIKPEN) 100 UNIT/ML, Inject 18 Units into the skin at bedtime. Reduced from 38 units., Disp: , Rfl:    insulin glargine-yfgn (SEMGLEE) 100 UNIT/ML injection, Inject into the skin., Disp: , Rfl:    Insulin Pen Needle 32G X 4 MM MISC, 1 each by Does not apply route in the morning, at noon, in the evening, and at bedtime., Disp: 120 each, Rfl: 11   meclizine (ANTIVERT) 25 MG tablet, Take 1 tablet (25 mg total) by mouth 3 (three) times daily as needed for dizziness., Disp: 60 tablet, Rfl: 1   metoCLOPramide (REGLAN) 5 MG tablet, Take 1 tablet (5 mg total) by mouth every 8 (eight) hours as needed for nausea., Disp: 30 tablet, Rfl: 0   naltrexone (DEPADE) 50 MG tablet, Take 1 tablet (50 mg total) by mouth daily., Disp: 30 tablet, Rfl: 2   nitroGLYCERIN  (NITROSTAT) 0.4 MG SL tablet, Place 1 tablet (0.4 mg total) under the tongue every 5 (five) minutes x 3 doses as needed for chest pain., Disp: 30 tablet, Rfl: 0   omeprazole (PRILOSEC) 40 MG capsule, Take 1 capsule by mouth once daily, Disp: 90 capsule, Rfl: 1   OZEMPIC, 2 MG/DOSE, 8 MG/3ML SOPN, INJECT 2MG  SUBCUTANEOUSLY ONCE A WEEK, Disp: 9 mL, Rfl: 0   Peppermint Oil (IBGARD PO), Take 1 tablet by mouth daily., Disp: , Rfl:    potassium chloride SA (KLOR-CON M) 20 MEQ tablet, Take 1 tablet (20 mEq total) by mouth every other day., Disp: 15 tablet, Rfl: 6   Resmetirom (REZDIFFRA) 100 MG TABS, Take by mouth., Disp: , Rfl:    REZDIFFRA 100 MG TABS, Take by mouth., Disp: , Rfl:    rivaroxaban (XARELTO) 20 MG TABS tablet, Take 1 tablet (20 mg total) by mouth daily with supper., Disp: 90 tablet, Rfl: 3   rosuvastatin (CRESTOR) 20 MG tablet, Take 1 tablet (20 mg total) by mouth daily., Disp: 90 tablet, Rfl: 3   sacubitril-valsartan (ENTRESTO) 97-103 MG, Take 1 tablet by mouth 2 (two) times daily. Hold until followup with cardiology due to acute kidney injury., Disp: 60 tablet, Rfl: 11   spironolactone (ALDACTONE) 25 MG tablet, Take 1 tablet (25 mg total) by mouth daily. Hold until followup with cardiology due to acute kidney injury., Disp: 90 tablet, Rfl: 3   TALTZ 80 MG/ML SOAJ, Inject 3 mLs into the skin every 14 (fourteen) days., Disp: , Rfl:    torsemide (DEMADEX) 20 MG tablet, Take 2 tablets (40 mg total) by mouth daily., Disp: 180 tablet, Rfl: 3   umeclidinium-vilanterol (ANORO ELLIPTA) 62.5-25 MCG/ACT AEPB, Inhale 1 puff into the lungs daily., Disp: 30 each, Rfl: 12   Subjective:   PATIENT ID: Gabriel [name]. GENDER: male DOB: [birth year], MRN: [id number]  Chief Complaint  Patient presents with   Follow-up    Shortness of breath on exertion and rest. Cough with occasional phlegm. No wheezing. Wearing CPAP nightly. Reports pressure could be increased.     HPI  Gabriel [name] is a 52 year old  male patient with a past medical history most notable for CAD, heart failure, diabetes, hypertension, PE on Xarelto, and OSA who is presenting to clinic for follow up.  Patient continues to have symptom burden that includes shortness of breath especially with exertion as well as dizziness.  He feels dizzy whenever he stands up or was about  doing much exertion.  He feels that something is not right with his body and is very different from his baseline.  He does have an occasional cough that is nonproductive.  He denies any chest tightness but does report occasional chest pains on the right side of his chest.  He does not have any fevers, chills, night sweats, or weight loss.  He denies any worsening lower extremity edema or increased abdominal fullness.  He is compliant with his medications and tries to be as compliant as he can with his BiPAP machine, but is unable to use it every night as it feels claustrophobic. On the days he does use it, he does feel somewhat rested in the morning. He continues opn Anoro Ellipta which hasn't provided any significant improvement in his symptoms.  Since our last meeting, he is followed up with rheumatology (at [facility]) and is currently maintained Ixekizumab and allopurinol.  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  He continues on rivaroxaban for history of unprovoked pulmonary embolism (with recurrence) and was last seen by oncology on October 7.  He is also followed by advanced heart failure and was last seen in the heart failure clinic yesterday on 11/18 with goal-directed medical therapy optimized.  He did present to the ED on 06/09/2023 with a chief complaint of shortness of breath as well as dizziness.  During said visit, a chest x-ray was performed and read as having potential pneumonia for which  she was prescribed antibiotics and steroids.  He feels no improvement following stent ED presentation.   During a previous visit, we reviewed a sleep study he had (split night with titration) and we started him on BiPAP (Auto-IPAP, EPAP of 12, no back up rate). AHI had improved to 10 with therapy during a previous visit and review of use.  Today, I reviewed his compliance report and it is still suboptimal.  Continued use of BiPAP is notable to decrease his AHI.  He has an upcoming visit with my colleague Dr. Wynona Neat on Thursday for a sleep consultation.   Patient reports that he has been short of breath with exertion since 2016 when he had his heart attack.  Over the past 8 years he has been experiencing exertional dyspnea with minimal activity. He reports an occasional cough that is dry and nonproductive.  He sometimes feels chest tightness but denies any wheezing.  He has had multiple hospitalizations for decompensations of his heart failure. He has been closely followed by cardiology and is on an aggressive regimen for the management of heart failure as well as on Rivaroxaban for pulmonary embolism. CardioMEMS was not approved by his insurance providers.    He had a RHC in October of 2021 while hospitalized for heart failure showing RA 9, RV 57/10, PA 57/28 (40), PCWP 17, CO 3.1 and CI of 1.3. PVR calculated at 7.4 Wood.   He does have a history of smoking and reports having smoked at least 2 packs a day for over 40 years. He quit in 2021. 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  Ancillary information including prior medications, full medical/surgical/family/social histories, and PFTs (when available) are listed below and have been reviewed.   Review of Systems  Constitutional:  Negative for chills, fever and weight loss.  Respiratory:  Positive for shortness of breath. Negative for cough, hemoptysis, sputum production and  wheezing.   Cardiovascular:  Negative for chest pain.  Skin:  Negative for rash.  Neurological:  Positive for dizziness.     Objective:   Vitals:   07/08/23 0839  BP: 126/80  Pulse: 84  Temp: 97.6 F (36.4 C)  TempSrc: Temporal  SpO2: 97%  Weight: 250 lb (113.4 kg)  Height: 5\' 11"  (1.803 m)   Blood pressure supine and erect both within normal and no sign of postural hypotension.  97% on RA BMI Readings from Last 3 Encounters:  07/08/23 34.87 kg/m  07/07/23 35.20 kg/m  06/16/23 34.69 kg/m   Wt Readings from Last 3 Encounters:  07/08/23 250 lb (113.4 kg)  07/07/23 252 lb 6 oz (114.5 kg)  06/16/23 248 lb 11.2 oz (112.8 kg)    Physical Exam Constitutional:      Appearance: He is obese. He is not ill-appearing.  HENT:     Nose: Nose normal.     Mouth/Throat:     Mouth: Mucous membranes are moist.  Neck:     Comments: Thick neck, facial hair Cardiovascular:     Rate and Rhythm: Normal rate and regular rhythm.     Pulses: Normal pulses.     Heart sounds: Normal heart sounds.  Pulmonary:     Effort: Pulmonary effort is normal.     Breath sounds: No wheezing or rales (faint bibasilar).  Abdominal:     General: There is distension.     Palpations: Abdomen is soft.  Neurological:     General: No focal deficit present.     Mental Status: He is alert and oriented to person, place, and time. Mental status is at baseline.       Ancillary Information    Past Medical History:  Diagnosis Date   Acute on chronic combined systolic and diastolic CHF (congestive heart failure) (HCC) 09/02/2017   CAD (coronary artery disease)    a. 04/2015 low risk MV;  b. 12/2016 Cath: minor irregs in LAD/Diag/LCX/OM, RCA 40p/m/d; c. 05/2020 Cath: LM nl, LAD 50d, LCX 30p, OM1 40, RCA 100p w/ L->R collats. CO/CI 3.1/1.3-->Med rx.   Chest wall pain, chronic    Chronic Troponin Elevation    CKD (chronic kidney disease), stage II-III    COPD (chronic obstructive pulmonary disease) (HCC)     Diabetes mellitus without complication (HCC)    HFrEF (heart failure with reduced ejection fraction) (HCC)    a. 03/2015 Echo: EF 45-50%; b. 12/2015 Echo: EF 20-25%; c. 02/2016 Echo: EF 30-35%; d. 11/2016 Echo: EF 40-45%; e. 06/2019 Echo: EF 30-35%; f. 11/2019 Echo: EF 25-30%; g. 05/2020 Echo: EF 35-40%; h. 05/2021 Echo: EF 40-45%; i. 11/2021 Echo: EF 20-25%, glob HK, mild LVH, GrIII DD, sev red RV fxn, mild MR.   Hypertension    Mitral regurgitation    Mild to moderate by October 2021 echocardiogram.   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 03/2015 Echo: EF 45-50%; b. 12/2015 Echo: EF 20-25%; c. 02/2016 Echo: EF 30-35%; d. 11/2016 Echo: EF 40-45%; e. 06/2019 Echo: EF 30-35%; f. 11/2019 Echo: EF 25-30%; g. 05/2020 Echo: EF 35-40%; h. 05/2021 Echo: EF 40-45%; i. 11/2021 Echo: EF 20-25%   Myocardial infarct (HCC)    NSVT (nonsustained ventricular tachycardia) (HCC)    a. 12/2015 noted on tele-->amio;  b. 12/2015 Event monitor: no VT noted.   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  12/31/19: RUL and RLL PEs.   Syncope    a. 01/2016 - felt to be vasovagal.     Family History  Problem Relation Age of Onset   Diabetes Mother    Diabetes Mellitus II Mother    Hypothyroidism Mother    Hypertension Mother    Kidney failure Mother        Dialysis   Heart attack Mother        86 yo approximately   Hypertension Father    Gout Father    Cancer Maternal Grandfather    Diabetes Maternal Grandfather    Cancer Paternal Aunt      Past Surgical History:  Procedure Laterality Date   AMPUTATION     CARDIAC CATHETERIZATION     COLONOSCOPY WITH PROPOFOL N/A 08/01/2022   Procedure: COLONOSCOPY WITH PROPOFOL;  Surgeon: Toney Reil, MD;  Location: [facility] ENDOSCOPY;  Service: Gastroenterology;  Laterality: N/A;   ESOPHAGOGASTRODUODENOSCOPY (EGD) WITH PROPOFOL N/A 08/01/2022   Procedure:  ESOPHAGOGASTRODUODENOSCOPY (EGD) WITH PROPOFOL;  Surgeon: Toney Reil, MD;  Location: [facility] ENDOSCOPY;  Service: Gastroenterology;  Laterality: N/A;   ESOPHAGOGASTRODUODENOSCOPY (EGD) WITH PROPOFOL N/A 10/16/2022   Procedure: ESOPHAGOGASTRODUODENOSCOPY (EGD) WITH PROPOFOL;  Surgeon: Toney Reil, MD;  Location: [facility] ENDOSCOPY;  Service: Gastroenterology;  Laterality: N/A;   FINGER AMPUTATION     Traumatic   FINGER FRACTURE SURGERY Left    FRACTURE SURGERY     LEFT HEART CATH AND CORONARY ANGIOGRAPHY N/A 01/06/2017   Procedure: Left Heart Cath and Coronary Angiography;  Surgeon: Iran Ouch, MD;  Location: [facility] INVASIVE CV LAB;  Service: Cardiovascular;  Laterality: N/A;   RIGHT/LEFT HEART CATH AND CORONARY ANGIOGRAPHY N/A 06/13/2020   Procedure: RIGHT/LEFT HEART CATH AND CORONARY ANGIOGRAPHY;  Surgeon: Yvonne Kendall, MD;  Location: [facility] INVASIVE CV LAB;  Service: Cardiovascular;  Laterality: N/A;   SUBQ ICD IMPLANT N/A 05/20/2022   Procedure: SUBQ ICD IMPLANT;  Surgeon: Lanier Prude, MD;  Location: [facility] INVASIVE CV LAB;  Service: Cardiovascular;  Laterality: N/A;    Social History   Socioeconomic History   Marital status: Widowed    Spouse name: Not on file   Number of children: 1   Years of education: 70   Highest education level: 12th grade  Occupational History   Occupation: disabled  Tobacco Use   Smoking status: Former    Current packs/day: 0.00    Average packs/day: 0.5 packs/day for 33.0 years (16.5 ttl pk-yrs)    Types: Cigarettes    Start date: 05/09/1987    Quit date: 05/08/2020    Years since quitting: 3.1   Smokeless tobacco: Former    Quit date: 05/08/2020   Tobacco comments:    Quit Sept 2021  Vaping Use   Vaping status: Former  Substance and Sexual Activity   Alcohol use: Not Currently    Comment: occassionally   Drug use: No   Sexual activity: Not Currently  Other Topics Concern   Not on file  Social History Narrative   Lives at  home with wife and his dad's wife.        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    Social Determinants of Health   Financial Resource Strain: Medium Risk (08/05/2022)   Overall Financial Resource Strain (CARDIA)    Difficulty of Paying Living Expenses: Somewhat hard  Food Insecurity: No  Food Insecurity (03/08/2023)   Hunger Vital Sign    Worried About Running Out of Food in the Last Year: Never true    Ran Out of Food in the Last Year: Never true  Transportation Needs: No Transportation Needs (03/08/2023)   PRAPARE - Administrator, Civil Service (Medical): No    Lack of Transportation (Non-Medical): No  Physical Activity: Insufficiently Active (01/20/2018)   Exercise Vital Sign    Days of Exercise per Week: 7 days    Minutes of Exercise per Session: 10 min  Stress: Stress Concern Present (09/02/2017)   [name] of Occupational Health - Occupational Stress Questionnaire    Feeling of Stress : Very much  Social Connections: Moderately Isolated (09/02/2017)   Social Connection and Isolation Panel [NHANES]    Frequency of Communication with Friends and Family: Twice a week    Frequency of Social Gatherings with Friends and Family: Once a week    Attends Religious Services: Never    Database administrator or Organizations: No    Attends Banker Meetings: Never    Marital Status: Widowed  Intimate Partner Violence: Not At Risk (03/08/2023)   Humiliation, Afraid, Rape, and Kick questionnaire    Fear of Current or Ex-Partner: No    Emotionally Abused: No    Physically Abused: No    Sexually Abused: No     Allergies  Allergen Reactions   Metformin And Related Nausea And Vomiting   Zantac [Ranitidine Hcl] Diarrhea and Nausea Only    Night sweats     CBC    Component Value Date/Time   WBC 7.5 06/16/2023 1311   RBC 6.16 (H) 06/16/2023 1311   HGB 17.3 (H) 06/16/2023 1311   HGB 17.0  11/05/2021 1403   HCT 52.2 (H) 06/16/2023 1311   HCT 51.9 (H) 11/05/2021 1403   PLT 214 06/16/2023 1311   PLT 170 11/05/2021 1403   MCV 84.7 06/16/2023 1311   MCV 86 11/05/2021 1403   MCV 85 10/07/2013 1703   MCH 28.1 06/16/2023 1311   MCHC 33.1 06/16/2023 1311   RDW 14.5 06/16/2023 1311   RDW 14.3 11/05/2021 1403   RDW 14.6 (H) 10/07/2013 1703   LYMPHSABS 2.0 06/16/2023 1311   LYMPHSABS 1.8 11/05/2021 1403   LYMPHSABS 2.3 08/26/2012 1200   MONOABS 0.5 06/16/2023 1311   MONOABS 0.9 08/26/2012 1200   EOSABS 0.2 06/16/2023 1311   EOSABS 0.1 11/05/2021 1403   EOSABS 0.1 08/26/2012 1200   BASOSABS 0.1 06/16/2023 1311   BASOSABS 0.1 11/05/2021 1403   BASOSABS 0.1 08/26/2012 1200    Pulmonary Functions Testing Results:    Latest Ref Rng & Units 10/17/2022    8:14 AM  PFT Results  FVC-Pre L 4.38   FVC-Predicted Pre % 85   FVC-Post L 4.50   FVC-Predicted Post % 87   Pre FEV1/FVC % % 83   Post FEV1/FCV % % 85   FEV1-Pre L 3.61   FEV1-Predicted Pre % 91   FEV1-Post L 3.83   DLCO uncorrected ml/min/mmHg 20.99   DLCO UNC% % 70   DLVA Predicted % 69   TLC L 6.58   TLC % Predicted % 91   RV % Predicted % 87     Outpatient Medications Prior to Visit  Medication Sig Dispense Refill   Accu-Chek Softclix Lancets lancets USE UP TO FOUR TIMES DAILY     albuterol (VENTOLIN HFA) 108 (90 Base) MCG/ACT inhaler Inhale 2 puffs into  the lungs every 4 (four) hours as needed for wheezing or shortness of breath. 8 g 6   allopurinol (ZYLOPRIM) 300 MG tablet Take 300 mg by mouth daily.     azelastine (ASTELIN) 0.1 % nasal spray Place 2 sprays into both nostrils 2 (two) times daily. Use in each nostril as directed 30 mL 12   BD PEN NEEDLE NANO 2ND GEN 32G X 4 MM MISC USE  AS DIRECTED AT BEDTIME 200 each 0   blood glucose meter kit and supplies KIT Dispense based on patient and insurance preference. Use up to four times daily as directed. 1 each 11   buPROPion (WELLBUTRIN XL) 150 MG 24 hr tablet  Take 1 tablet (150 mg total) by mouth daily. 90 tablet 3   butalbital-acetaminophen-caffeine (FIORICET) 50-325-40 MG tablet 1 tablet.     carvedilol (COREG) 6.25 MG tablet Take 2 tablets (12.5 mg total) by mouth 2 (two) times daily. Increased from 6.25 mg 2 times daily. 120 tablet 0   cetirizine (ZYRTEC) 10 MG tablet Take 1 tablet (10 mg total) by mouth daily. 30 tablet 11   colchicine 0.6 MG tablet Day 1: Take 2 tablets PO for gout flare, repeat 1 tablet one hour following initial dose. Day 2: Take 1 tablet twice daily PO for an additional 2-3 days. 60 tablet 0   Continuous Glucose Sensor (FREESTYLE LIBRE 3 SENSOR) MISC PLACE 1 SENSOR ON TO THE SKIN EVERY 14 DAYS. USE TO CHECK BLOOD SUGAR CONTINUEOUSLY.     Continuous Glucose Sensor (FREESTYLE LIBRE 3 SENSOR) MISC 1 Application by Does not apply route every 14 (fourteen) days. Use to check blood sugar continuously. 6 each 0   cyclobenzaprine (FLEXERIL) 5 MG tablet Take 1 tablet (5 mg total) by mouth as needed. 10 tablet 0   dapagliflozin propanediol (FARXIGA) 10 MG TABS tablet Take 1 tablet (10 mg total) by mouth daily. 90 tablet 3   dicyclomine (BENTYL) 10 MG capsule Take 2 capsules (20 mg total) by mouth 4 (four) times daily -  before meals and at bedtime. 240 capsule 0   fenofibrate (TRICOR) 145 MG tablet Take 1 tablet (145 mg total) by mouth daily. 90 tablet 3   FLUoxetine (PROZAC) 20 MG capsule Take 1 capsule (20 mg total) by mouth daily. 90 capsule 3   fluticasone (FLONASE) 50 MCG/ACT nasal spray Place 2 sprays into both nostrils daily as needed. Home med.     icosapent Ethyl (VASCEPA) 1 g capsule Take 2 capsules (2 g total) by mouth 2 (two) times daily. 360 capsule 1   insulin aspart (NOVOLOG) 100 UNIT/ML FlexPen Inject 10 Units into the skin 3 (three) times daily with meals. Inject 10 units + correction before breakfast/lunch and supper (correction = 1 unit per 50 mg/dL above 706); max daily dose 36 units 15 mL 3   Insulin Glargine (BASAGLAR  KWIKPEN) 100 UNIT/ML Inject 18 Units into the skin at bedtime. Reduced from 38 units.     insulin glargine-yfgn (SEMGLEE) 100 UNIT/ML injection Inject into the skin.     Insulin Pen Needle 32G X 4 MM MISC 1 each by Does not apply route in the morning, at noon, in the evening, and at bedtime. 120 each 11   meclizine (ANTIVERT) 25 MG tablet Take 1 tablet (25 mg total) by mouth 3 (three) times daily as needed for dizziness. 60 tablet 1   metoCLOPramide (REGLAN) 5 MG tablet Take 1 tablet (5 mg total) by mouth every 8 (eight) hours as needed  for nausea. 30 tablet 0   naltrexone (DEPADE) 50 MG tablet Take 1 tablet (50 mg total) by mouth daily. 30 tablet 2   nitroGLYCERIN (NITROSTAT) 0.4 MG SL tablet Place 1 tablet (0.4 mg total) under the tongue every 5 (five) minutes x 3 doses as needed for chest pain. 30 tablet 0   omeprazole (PRILOSEC) 40 MG capsule Take 1 capsule by mouth once daily 90 capsule 1   OZEMPIC, 2 MG/DOSE, 8 MG/3ML SOPN INJECT 2MG  SUBCUTANEOUSLY ONCE A WEEK 9 mL 0   Peppermint Oil (IBGARD PO) Take 1 tablet by mouth daily.     potassium chloride SA (KLOR-CON M) 20 MEQ tablet Take 1 tablet (20 mEq total) by mouth every other day. 15 tablet 6   Resmetirom (REZDIFFRA) 100 MG TABS Take by mouth.     REZDIFFRA 100 MG TABS Take by mouth.     rivaroxaban (XARELTO) 20 MG TABS tablet Take 1 tablet (20 mg total) by mouth daily with supper. 90 tablet 3   rosuvastatin (CRESTOR) 20 MG tablet Take 1 tablet (20 mg total) by mouth daily. 90 tablet 3   sacubitril-valsartan (ENTRESTO) 97-103 MG Take 1 tablet by mouth 2 (two) times daily. Hold until followup with cardiology due to acute kidney injury. 60 tablet 11   spironolactone (ALDACTONE) 25 MG tablet Take 1 tablet (25 mg total) by mouth daily. Hold until followup with cardiology due to acute kidney injury. 90 tablet 3   TALTZ 80 MG/ML SOAJ Inject 3 mLs into the skin every 14 (fourteen) days.     torsemide (DEMADEX) 20 MG tablet Take 2 tablets (40 mg  total) by mouth daily. 180 tablet 3   umeclidinium-vilanterol (ANORO ELLIPTA) 62.5-25 MCG/ACT AEPB Inhale 1 puff into the lungs daily. 30 each 12   No facility-administered medications prior to visit.

## 2023-07-08 NOTE — Telephone Encounter (Signed)
Rx sent 

## 2023-07-09 DIAGNOSIS — R519 Headache, unspecified: Secondary | ICD-10-CM | POA: Diagnosis not present

## 2023-07-09 DIAGNOSIS — R42 Dizziness and giddiness: Secondary | ICD-10-CM | POA: Diagnosis not present

## 2023-07-10 ENCOUNTER — Ambulatory Visit (INDEPENDENT_AMBULATORY_CARE_PROVIDER_SITE_OTHER): Payer: Medicaid Other | Admitting: Pulmonary Disease

## 2023-07-10 ENCOUNTER — Encounter: Payer: Self-pay | Admitting: Pulmonary Disease

## 2023-07-10 VITALS — BP 107/68 | HR 85 | Ht 71.0 in | Wt 250.0 lb

## 2023-07-10 DIAGNOSIS — G4733 Obstructive sleep apnea (adult) (pediatric): Secondary | ICD-10-CM

## 2023-07-10 DIAGNOSIS — R0602 Shortness of breath: Secondary | ICD-10-CM

## 2023-07-10 DIAGNOSIS — J449 Chronic obstructive pulmonary disease, unspecified: Secondary | ICD-10-CM

## 2023-07-10 DIAGNOSIS — I272 Pulmonary hypertension, unspecified: Secondary | ICD-10-CM

## 2023-07-10 NOTE — Patient Instructions (Addendum)
Will see you back in about 4 weeks  Lets wait for the result of his sleep study that is scheduled for December 2 before changes need made about your BiPAP  Continue to try to use it on a regular basis  Hopefully if you get good enough sleep during the study, we should be able to get you on the right pressure that you need for the sleep apnea

## 2023-07-10 NOTE — Progress Notes (Signed)
Gabriel [name]    [id number]    03-17-1971  Primary Care Physician:Payne, Daryl Eastern, FNP  Referring Physician: Jacky Kindle, FNP [address] [place],  [place] [address]  Chief complaint:   Patient being seen for obstructive sleep apnea  HPI:  Patient currently using a BiPAP diagnosed with severe obstructive sleep apnea From his sleep study appears that he is on auto titrating BiPAP  Mask fits well  He feels that he is not getting enough pressure  He is set up to have a retitration study on December 2  He does have chronic shortness of breath, heart failure with reduced ejection fraction, pulmonary hypertension Continues to be short of breath Recently saw Dr. Aundria Rud  Dr. Doreene Adas notes were reviewed  Patient usually goes to bed between 9 and 930 Wakes up about 7 AM Wakes up feeling rested He feels his mask works well, does not have significant mask leak  Last sleep titration study was in December 2023, was started on auto BiPAP after the study He does have a brother with obstructive sleep apnea uses a machine  His weight has been steady  Outpatient Encounter Medications as of 07/10/2023  Medication Sig   Accu-Chek Softclix Lancets lancets USE UP TO FOUR TIMES DAILY   albuterol (VENTOLIN HFA) 108 (90 Base) MCG/ACT inhaler Inhale 2 puffs into the lungs every 4 (four) hours as needed for wheezing or shortness of breath.   allopurinol (ZYLOPRIM) 300 MG tablet Take 300 mg by mouth daily.   azelastine (ASTELIN) 0.1 % nasal spray Place 2 sprays into both nostrils 2 (two) times daily. Use in each nostril as directed   BD PEN NEEDLE NANO 2ND GEN 32G X 4 MM MISC USE  AS DIRECTED AT BEDTIME   blood glucose meter kit and supplies KIT Dispense based on patient and insurance preference. Use up to four times daily as directed.   buPROPion (WELLBUTRIN XL) 150 MG 24 hr tablet Take 1 tablet (150 mg total) by mouth daily.   butalbital-acetaminophen-caffeine (FIORICET)  50-325-40 MG tablet 1 tablet.   carvedilol (COREG) 6.25 MG tablet Take 2 tablets (12.5 mg total) by mouth 2 (two) times daily. Increased from 6.25 mg 2 times daily.   cetirizine (ZYRTEC) 10 MG tablet Take 1 tablet (10 mg total) by mouth daily.   colchicine 0.6 MG tablet Day 1: Take 2 tablets PO for gout flare, repeat 1 tablet one hour following initial dose. Day 2: Take 1 tablet twice daily PO for an additional 2-3 days.   Continuous Glucose Sensor (FREESTYLE LIBRE 3 SENSOR) MISC PLACE 1 SENSOR ON TO THE SKIN EVERY 14 DAYS. USE TO CHECK BLOOD SUGAR CONTINUEOUSLY.   Continuous Glucose Sensor (FREESTYLE LIBRE 3 SENSOR) MISC 1 Application by Does not apply route every 14 (fourteen) days. Use to check blood sugar continuously.   cyclobenzaprine (FLEXERIL) 5 MG tablet Take 1 tablet (5 mg total) by mouth as needed.   dapagliflozin propanediol (FARXIGA) 10 MG TABS tablet Take 1 tablet (10 mg total) by mouth daily.   dicyclomine (BENTYL) 10 MG capsule Take 2 capsules (20 mg total) by mouth 4 (four) times daily -  before meals and at bedtime.   fenofibrate (TRICOR) 145 MG tablet Take 1 tablet (145 mg total) by mouth daily.   FLUoxetine (PROZAC) 20 MG capsule Take 1 capsule (20 mg total) by mouth daily.   fluticasone (FLONASE) 50 MCG/ACT nasal spray Place 2 sprays into both nostrils  daily as needed. Home med.   icosapent Ethyl (VASCEPA) 1 g capsule Take 2 capsules (2 g total) by mouth 2 (two) times daily.   insulin aspart (NOVOLOG) 100 UNIT/ML FlexPen Inject 10 Units into the skin 3 (three) times daily with meals. Inject 10 units + correction before breakfast/lunch and supper (correction = 1 unit per 50 mg/dL above 540); max daily dose 36 units   Insulin Glargine (BASAGLAR KWIKPEN) 100 UNIT/ML Inject 18 Units into the skin at bedtime. Reduced from 38 units.   insulin glargine-yfgn (SEMGLEE) 100 UNIT/ML injection Inject into the skin.   Insulin Pen Needle 32G X 4 MM MISC 1 each by Does not apply route in the  morning, at noon, in the evening, and at bedtime.   meclizine (ANTIVERT) 25 MG tablet Take 1 tablet (25 mg total) by mouth 3 (three) times daily as needed for dizziness.   metoCLOPramide (REGLAN) 5 MG tablet Take 1 tablet (5 mg total) by mouth every 8 (eight) hours as needed for nausea.   naltrexone (DEPADE) 50 MG tablet Take 1 tablet (50 mg total) by mouth daily.   nitroGLYCERIN (NITROSTAT) 0.4 MG SL tablet Place 1 tablet (0.4 mg total) under the tongue every 5 (five) minutes x 3 doses as needed for chest pain.   omeprazole (PRILOSEC) 40 MG capsule Take 1 capsule by mouth once daily   OZEMPIC, 2 MG/DOSE, 8 MG/3ML SOPN INJECT 2MG  SUBCUTANEOUSLY ONCE A WEEK   Peppermint Oil (IBGARD PO) Take 1 tablet by mouth daily.   potassium chloride SA (KLOR-CON M) 20 MEQ tablet Take 1 tablet (20 mEq total) by mouth every other day.   Resmetirom (REZDIFFRA) 100 MG TABS Take by mouth.   REZDIFFRA 100 MG TABS Take by mouth.   rivaroxaban (XARELTO) 20 MG TABS tablet Take 1 tablet (20 mg total) by mouth daily with supper.   rosuvastatin (CRESTOR) 20 MG tablet Take 1 tablet (20 mg total) by mouth daily.   sacubitril-valsartan (ENTRESTO) 97-103 MG Take 1 tablet by mouth 2 (two) times daily. Hold until followup with cardiology due to acute kidney injury.   spironolactone (ALDACTONE) 25 MG tablet Take 1 tablet (25 mg total) by mouth daily. Hold until followup with cardiology due to acute kidney injury.   TALTZ 80 MG/ML SOAJ Inject 3 mLs into the skin every 14 (fourteen) days.   torsemide (DEMADEX) 20 MG tablet Take 2 tablets (40 mg total) by mouth daily.   umeclidinium-vilanterol (ANORO ELLIPTA) 62.5-25 MCG/ACT AEPB Inhale 1 puff into the lungs daily.   [DISCONTINUED] insulin lispro (HUMALOG) 100 UNIT/ML KwikPen Inject 3 units before breakfast and lunch. 5 units before dinner.   No facility-administered encounter medications on file as of 07/10/2023.    Allergies as of 07/10/2023 - Review Complete 07/10/2023   Allergen Reaction Noted   Metformin and related Nausea And Vomiting 11/22/2019   Zantac [ranitidine hcl] Diarrhea and Nausea Only 08/27/2018    Past Medical History:  Diagnosis Date   Acute on chronic combined systolic and diastolic CHF (congestive heart failure) (HCC) 09/02/2017   CAD (coronary artery disease)    a. 04/2015 low risk MV;  b. 12/2016 Cath: minor irregs in LAD/Diag/LCX/OM, RCA 40p/m/d; c. 05/2020 Cath: LM nl, LAD 50d, LCX 30p, OM1 40, RCA 100p w/ L->R collats. CO/CI 3.1/1.3-->Med rx.   Chest wall pain, chronic    Chronic Troponin Elevation    CKD (chronic kidney disease), stage II-III    COPD (chronic obstructive pulmonary disease) (HCC)    Diabetes  mellitus without complication (HCC)    HFrEF (heart failure with reduced ejection fraction) (HCC)    a. 03/2015 Echo: EF 45-50%; b. 12/2015 Echo: EF 20-25%; c. 02/2016 Echo: EF 30-35%; d. 11/2016 Echo: EF 40-45%; e. 06/2019 Echo: EF 30-35%; f. 11/2019 Echo: EF 25-30%; g. 05/2020 Echo: EF 35-40%; h. 05/2021 Echo: EF 40-45%; i. 11/2021 Echo: EF 20-25%, glob HK, mild LVH, GrIII DD, sev red RV fxn, mild MR.   Hypertension    Mitral regurgitation    Mild to moderate by October 2021 echocardiogram.   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 03/2015 Echo: EF 45-50%; b. 12/2015 Echo: EF 20-25%; c. 02/2016 Echo: EF 30-35%; d. 11/2016 Echo: EF 40-45%; e. 06/2019 Echo: EF 30-35%; f. 11/2019 Echo: EF 25-30%; g. 05/2020 Echo: EF 35-40%; h. 05/2021 Echo: EF 40-45%; i. 11/2021 Echo: EF 20-25%   Myocardial infarct (HCC)    NSVT (nonsustained ventricular tachycardia) (HCC)    a. 12/2015 noted on tele-->amio;  b. 12/2015 Event monitor: no VT noted.   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  12/31/19: RUL and RLL PEs.   Syncope    a. 01/2016 - felt to be vasovagal.    Past Surgical History:  Procedure Laterality Date   AMPUTATION     CARDIAC CATHETERIZATION      COLONOSCOPY WITH PROPOFOL N/A 08/01/2022   Procedure: COLONOSCOPY WITH PROPOFOL;  Surgeon: Toney Reil, MD;  Location: [facility] ENDOSCOPY;  Service: Gastroenterology;  Laterality: N/A;   ESOPHAGOGASTRODUODENOSCOPY (EGD) WITH PROPOFOL N/A 08/01/2022   Procedure: ESOPHAGOGASTRODUODENOSCOPY (EGD) WITH PROPOFOL;  Surgeon: Toney Reil, MD;  Location: [facility] ENDOSCOPY;  Service: Gastroenterology;  Laterality: N/A;   ESOPHAGOGASTRODUODENOSCOPY (EGD) WITH PROPOFOL N/A 10/16/2022   Procedure: ESOPHAGOGASTRODUODENOSCOPY (EGD) WITH PROPOFOL;  Surgeon: Toney Reil, MD;  Location[facility] ENDOSCOPY;  Service: Gastroenterology;  Laterality: N/A;   FINGER AMPUTATION     Traumatic   FINGER FRACTURE SURGERY Left    FRACTURE SURGERY     LEFT HEART CATH AND CORONARY ANGIOGRAPHY N/A 01/06/2017   Procedure: Left Heart Cath and Coronary Angiography;  Surgeon: Iran Ouch, MD;  Location: [facility] INVASIVE CV LAB;  Service: Cardiovascular;  Laterality: N/A;   RIGHT/LEFT HEART CATH AND CORONARY ANGIOGRAPHY N/A 06/13/2020   Procedure: RIGHT/LEFT HEART CATH AND CORONARY ANGIOGRAPHY;  Surgeon: Yvonne Kendall, MD;  Location: [facility] INVASIVE CV LAB;  Service: Cardiovascular;  Laterality: N/A;   SUBQ ICD IMPLANT N/A 05/20/2022   Procedure: SUBQ ICD IMPLANT;  Surgeon: Lanier Prude, MD;  Location: [facility] INVASIVE CV LAB;  Service: Cardiovascular;  Laterality: N/A;    Family History  Problem Relation Age of Onset   Diabetes Mother    Diabetes Mellitus II Mother    Hypothyroidism Mother    Hypertension Mother    Kidney failure Mother        Dialysis   Heart attack Mother        15 yo approximately   Hypertension Father    Gout Father    Cancer Maternal Grandfather    Diabetes Maternal Grandfather    Cancer Paternal Aunt     Social History   Socioeconomic History   Marital status: Widowed    Spouse name: Not on file   Number of children: 1   Years of education: 27   Highest education  level: 12th grade  Occupational History   Occupation: disabled  Tobacco  Use   Smoking status: Former    Current packs/day: 0.00    Average packs/day: 0.5 packs/day for 33.0 years (16.5 ttl pk-yrs)    Types: Cigarettes    Start date: 05/09/1987    Quit date: 05/08/2020    Years since quitting: 3.1   Smokeless tobacco: Former    Quit date: 05/08/2020   Tobacco comments:    Quit Sept 2021  Vaping Use   Vaping status: Former  Substance and Sexual Activity   Alcohol use: Not Currently    Comment: occassionally   Drug use: No   Sexual activity: Not Currently  Other Topics Concern   Not on file  Social History Narrative   Lives at home with wife and his dad's wife.        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    Social Determinants of Health   Financial Resource Strain: Medium Risk (08/05/2022)   Overall Financial Resource Strain (CARDIA)    Difficulty of Paying Living Expenses: Somewhat hard  Food Insecurity: No Food Insecurity (03/08/2023)   Hunger Vital Sign    Worried About Running Out of Food in the Last Year: Never true    Ran Out of Food in the Last Year: Never true  Transportation Needs: No Transportation Needs (03/08/2023)   PRAPARE - Administrator, Civil Service (Medical): No    Lack of Transportation (Non-Medical): No  Physical Activity: Insufficiently Active (01/20/2018)   Exercise Vital Sign    Days of Exercise per Week: 7 days    Minutes of Exercise per Session: 10 min  Stress: Stress Concern Present (09/02/2017)   [name] of Occupational Health - Occupational Stress Questionnaire    Feeling of Stress : Very much  Social Connections: Moderately Isolated (09/02/2017)   Social Connection and Isolation Panel [NHANES]    Frequency of Communication with Friends and Family: Twice a week    Frequency of Social Gatherings with Friends and Family: Once a week    Attends Religious  Services: Never    Database administrator or Organizations: No    Attends Banker Meetings: Never    Marital Status: Widowed  Intimate Partner Violence: Not At Risk (03/08/2023)   Humiliation, Afraid, Rape, and Kick questionnaire    Fear of Current or Ex-Partner: No    Emotionally Abused: No    Physically Abused: No    Sexually Abused: No    Review of Systems  Respiratory:  Positive for apnea.   Psychiatric/Behavioral:  Positive for sleep disturbance.     Vitals:   07/10/23 1113  BP: 107/68  Pulse: 85  SpO2: 96%     Physical Exam Constitutional:      Appearance: He is obese.  HENT:     Head: Normocephalic.     Mouth/Throat:     Mouth: Mucous membranes are moist.  Eyes:     General: No scleral icterus. Cardiovascular:     Rate and Rhythm: Normal rate and regular rhythm.     Heart sounds: No murmur heard.    No friction rub.  Pulmonary:     Effort: No respiratory distress.     Breath sounds: No stridor. No wheezing or rhonchi.  Musculoskeletal:     Cervical back: No rigidity or tenderness.  Neurological:     Mental Status: He is alert.  Psychiatric:        Mood and Affect: Mood normal.  Data Reviewed: Most recent titration study reviewed  I do not have a BiPAP download on him today  Assessment:  History of obstructive sleep apnea -Suboptimally treated at present -Patient not tolerating pressures well -Has no mask issues Just feels pressure is not appropriate  He is scheduled for a retitration study on 07/21/2023 and I think it is appropriate to hold steady at present until the study is done.  If he is able to get enough rest on that night, we should be able to titrate him to the optimal pressures  Shortness of breath  Systolic heart failure  Pulmonary hypertension  Plan/Recommendations: Encouraged to make sure the gets his titration study performed  Will try and see him back in the office in about 4 to 6 weeks  Repeat in his  titration is about the only way to be sure that he is on the appropriate pressure settings  We will unable to get a download for him today to review  Encouraged to call with significant concerns  He will continue to follow-up with Dr. Aundria Rud and I will see him in follow-up for his sleep apnea   Virl Diamond MD [facility] 07/10/2023, 11:38 AM  CC: Jacky Kindle, FNP

## 2023-07-11 ENCOUNTER — Ambulatory Visit (INDEPENDENT_AMBULATORY_CARE_PROVIDER_SITE_OTHER): Payer: Medicaid Other | Admitting: Family Medicine

## 2023-07-11 ENCOUNTER — Other Ambulatory Visit: Payer: Self-pay

## 2023-07-11 ENCOUNTER — Telehealth: Payer: Self-pay | Admitting: Pharmacy Technician

## 2023-07-11 ENCOUNTER — Encounter: Payer: Self-pay | Admitting: Oncology

## 2023-07-11 ENCOUNTER — Other Ambulatory Visit (HOSPITAL_COMMUNITY): Payer: Self-pay

## 2023-07-11 ENCOUNTER — Encounter: Payer: Self-pay | Admitting: Family Medicine

## 2023-07-11 VITALS — BP 122/69 | HR 87 | Ht 71.0 in | Wt 246.0 lb

## 2023-07-11 DIAGNOSIS — E662 Morbid (severe) obesity with alveolar hypoventilation: Secondary | ICD-10-CM | POA: Diagnosis not present

## 2023-07-11 DIAGNOSIS — Z23 Encounter for immunization: Secondary | ICD-10-CM

## 2023-07-11 DIAGNOSIS — I4729 Other ventricular tachycardia: Secondary | ICD-10-CM

## 2023-07-11 DIAGNOSIS — Z794 Long term (current) use of insulin: Secondary | ICD-10-CM

## 2023-07-11 DIAGNOSIS — I5022 Chronic systolic (congestive) heart failure: Secondary | ICD-10-CM

## 2023-07-11 DIAGNOSIS — E1169 Type 2 diabetes mellitus with other specified complication: Secondary | ICD-10-CM | POA: Diagnosis not present

## 2023-07-11 DIAGNOSIS — F339 Major depressive disorder, recurrent, unspecified: Secondary | ICD-10-CM | POA: Diagnosis not present

## 2023-07-11 DIAGNOSIS — I25118 Atherosclerotic heart disease of native coronary artery with other forms of angina pectoris: Secondary | ICD-10-CM | POA: Diagnosis not present

## 2023-07-11 DIAGNOSIS — I428 Other cardiomyopathies: Secondary | ICD-10-CM | POA: Diagnosis not present

## 2023-07-11 DIAGNOSIS — N1832 Chronic kidney disease, stage 3b: Secondary | ICD-10-CM

## 2023-07-11 DIAGNOSIS — E66811 Obesity, class 1: Secondary | ICD-10-CM | POA: Insufficient documentation

## 2023-07-11 DIAGNOSIS — Z6834 Body mass index (BMI) 34.0-34.9, adult: Secondary | ICD-10-CM

## 2023-07-11 DIAGNOSIS — J449 Chronic obstructive pulmonary disease, unspecified: Secondary | ICD-10-CM

## 2023-07-11 DIAGNOSIS — E782 Mixed hyperlipidemia: Secondary | ICD-10-CM

## 2023-07-11 LAB — POCT GLYCOSYLATED HEMOGLOBIN (HGB A1C): Hemoglobin A1C: 6.6 % — AB (ref 4.0–5.6)

## 2023-07-11 IMAGING — CR DG CHEST 2V
2 series · 2 of 2 positions shown · non-contrast
Comparison: 12/04/2021

CLINICAL DATA: Dizziness

EXAM:
CHEST - 2 VIEW

[chest lat]
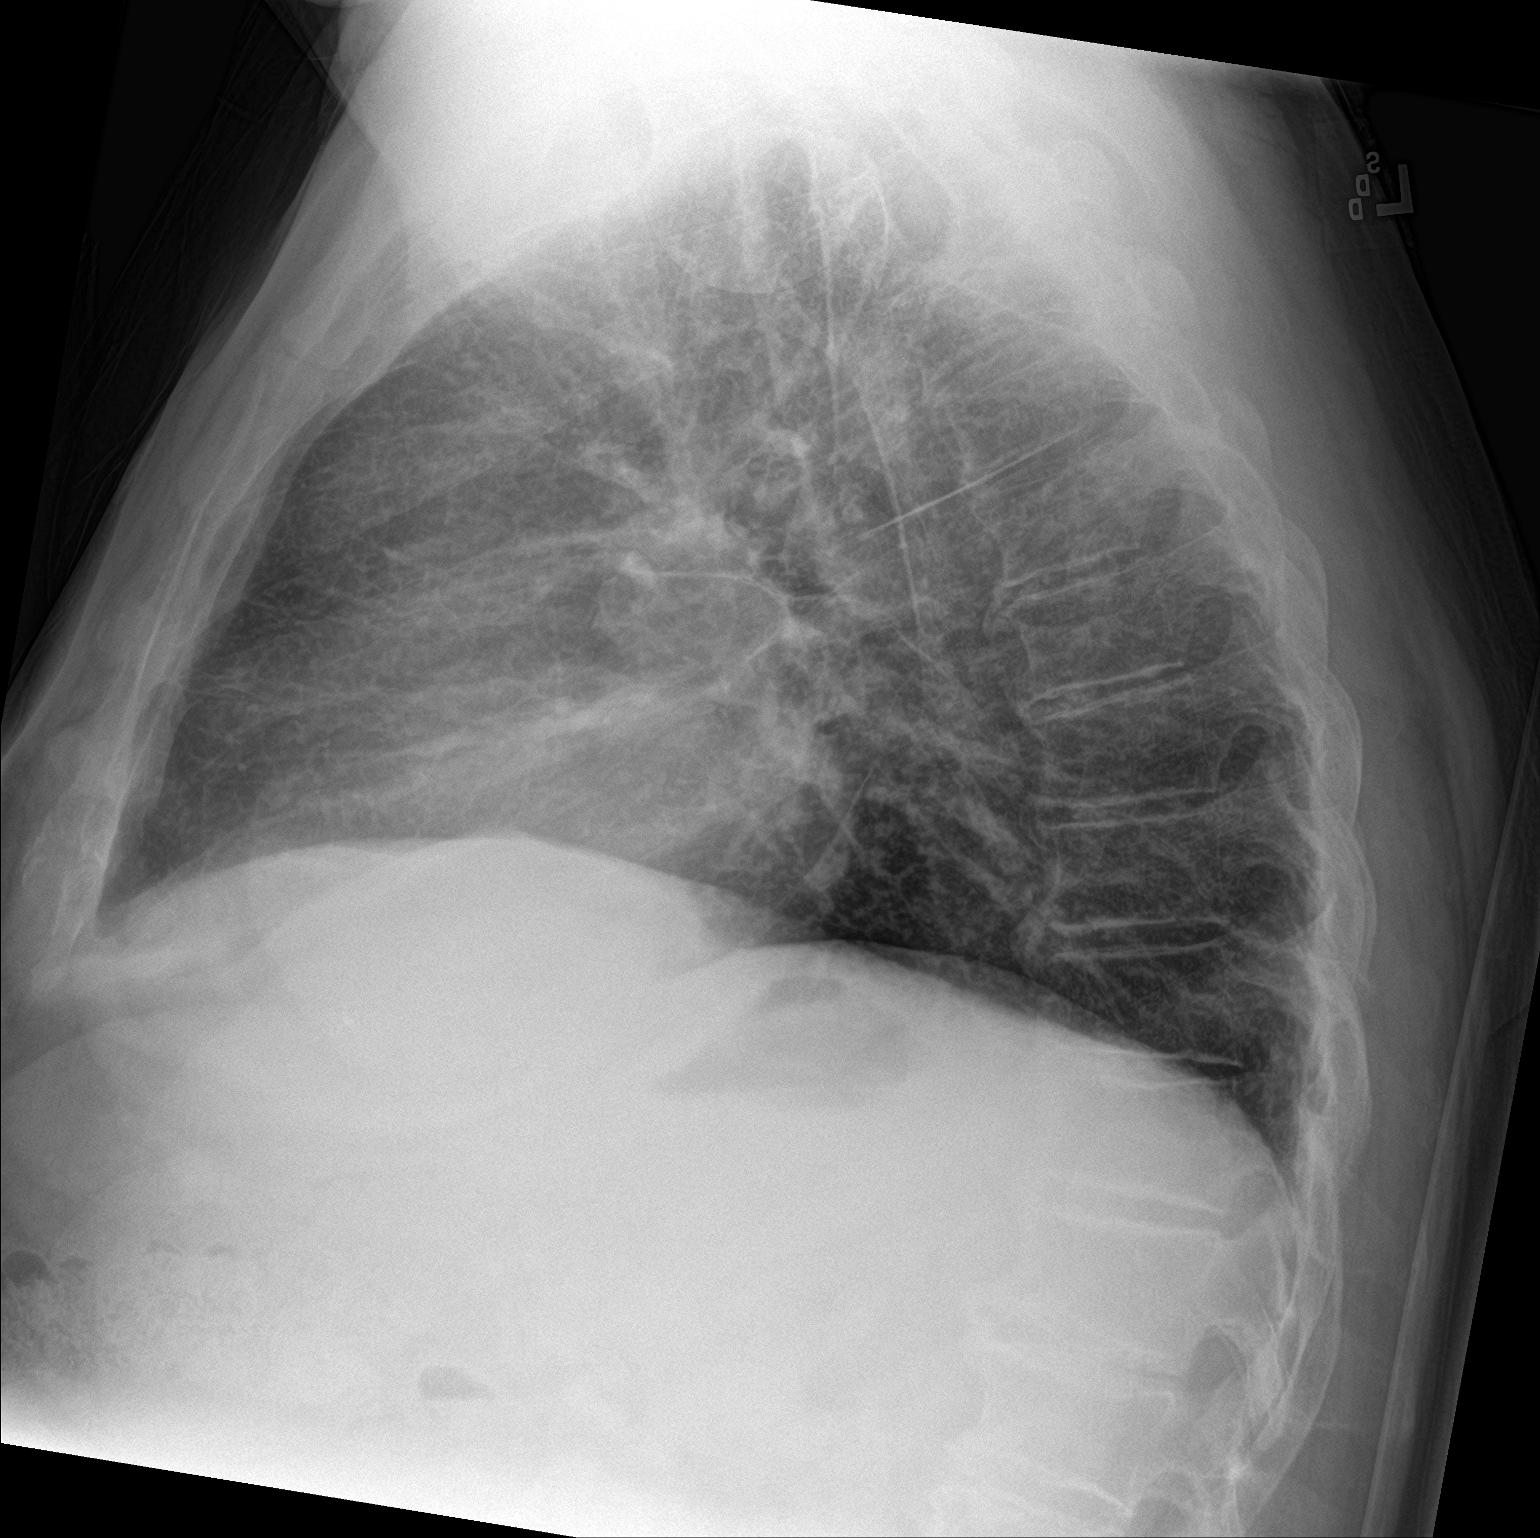

[chest ap]
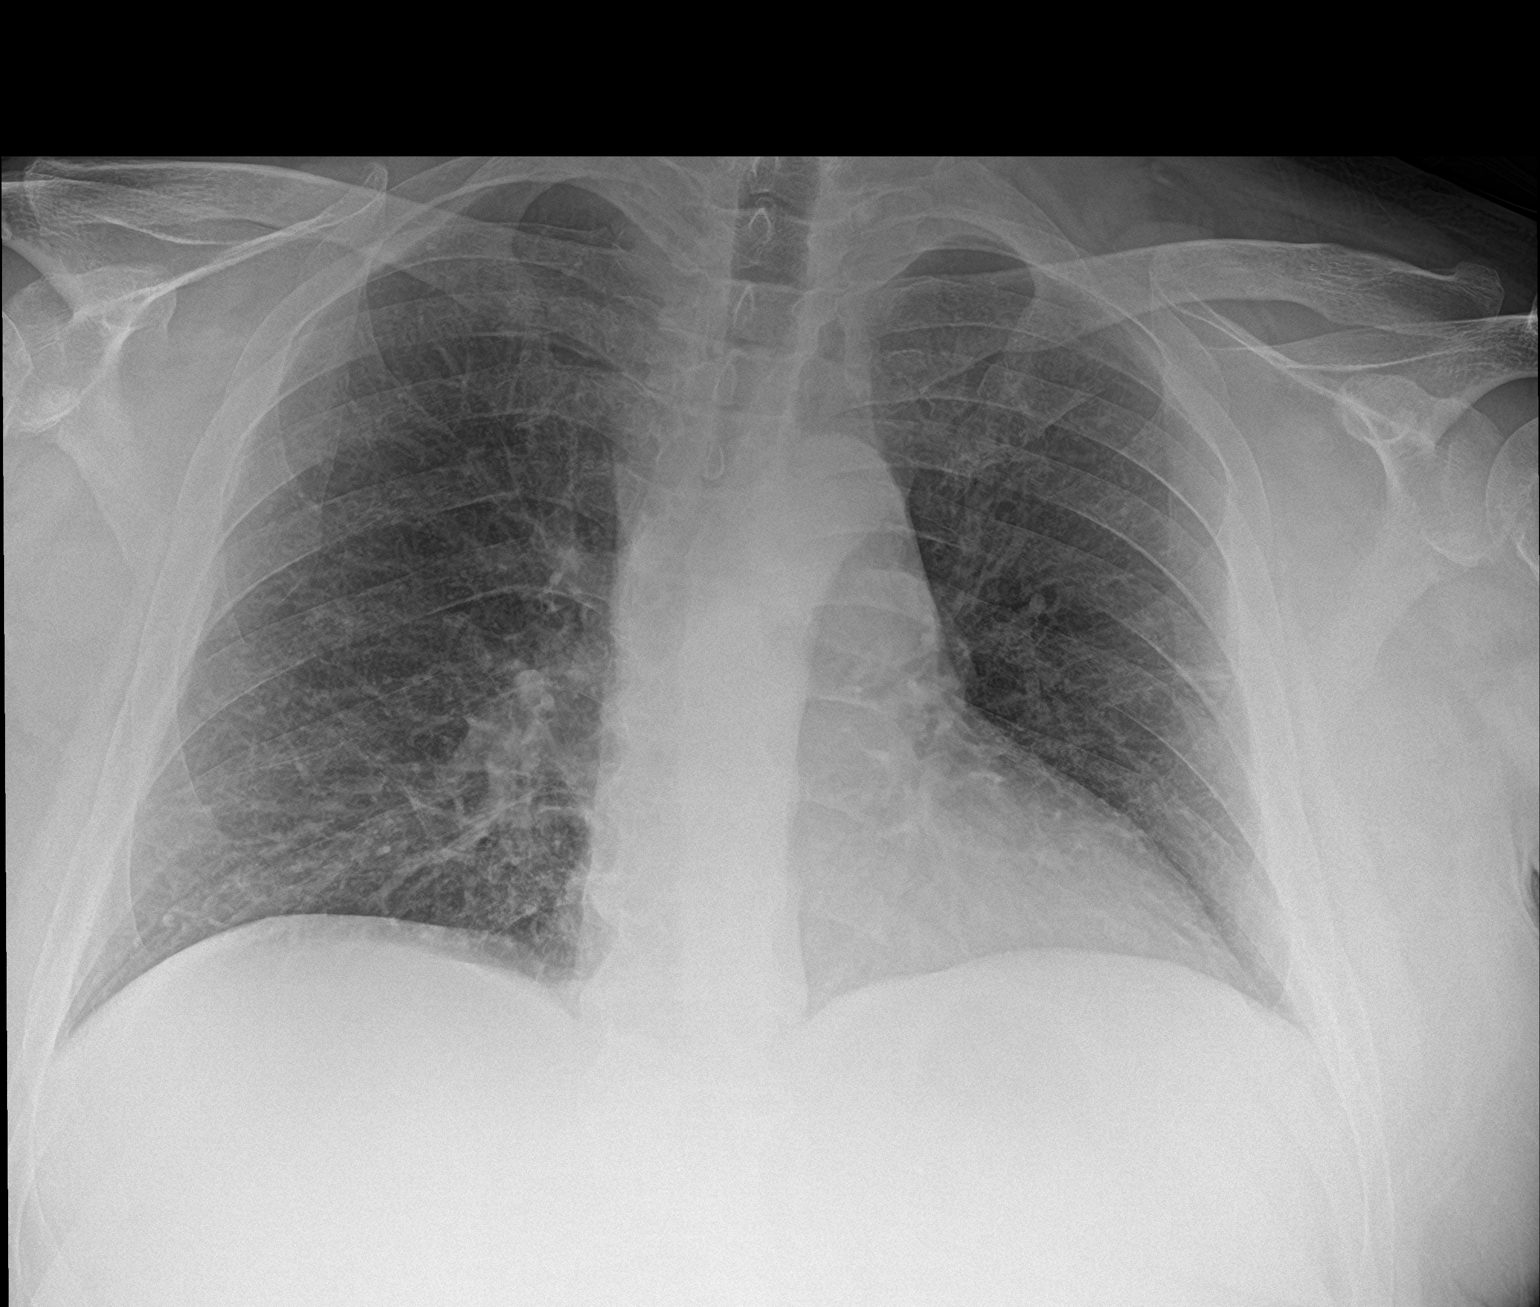

[2 of 2 positions shown; findings below may reference images not displayed]

FINDINGS: Heart and mediastinal contours are within normal limits. No focal
opacities or effusions. No acute bony abnormality.
IMPRESSION: No active cardiopulmonary disease.

## 2023-07-11 MED ORDER — BASAGLAR KWIKPEN 100 UNIT/ML ~~LOC~~ SOPN: 36.0000 [IU] | PEN_INJECTOR | Freq: Every day | SUBCUTANEOUS | 3 refills | Status: DC

## 2023-07-11 MED ORDER — NALTREXONE HCL 50 MG PO TABS: 50.0000 mg | ORAL_TABLET | Freq: Every day | ORAL | 3 refills | Status: DC

## 2023-07-11 MED ORDER — FREESTYLE LIBRE 3 SENSOR MISC
1.0000 | 0 refills | Status: DC
  Filled 2023-07-11: qty 6, 84d supply, fill #0

## 2023-07-11 MED ORDER — FENOFIBRATE 145 MG PO TABS: 145.0000 mg | ORAL_TABLET | Freq: Every day | ORAL | 3 refills | Status: DC

## 2023-07-11 NOTE — Telephone Encounter (Addendum)
Pharmacy Patient Advocate Encounter   Received notification from Fax that prior authorization for dapagliflozin is required/requested.   Insurance verification completed.   The patient is insured through [facility] .   Per test claim: PA required; PA submitted to above mentioned insurance via CoverMyMeds Key/confirmation #/EOC [id number] Status is pending

## 2023-07-11 NOTE — Progress Notes (Unsigned)
Established patient visit   Patient: Gabriel [name].   DOB: 05-13-1971   52 y.o. Male  MRN: [id number] Visit Date: 07/11/2023  Today's healthcare provider: Jacky Kindle, FNP  Re Introduced to nurse practitioner role and practice setting.  All questions answered.  Discussed provider/patient relationship and expectations.  Subjective    HPI HPI   Diabetes f/u Meds refill Last edited by Shelly Bombard, CMA on 07/11/2023  1:06 PM.      Medications: Outpatient Medications Prior to Visit  Medication Sig   Accu-Chek Softclix Lancets lancets USE UP TO FOUR TIMES DAILY   albuterol (VENTOLIN HFA) 108 (90 Base) MCG/ACT inhaler Inhale 2 puffs into the lungs every 4 (four) hours as needed for wheezing or shortness of breath.   allopurinol (ZYLOPRIM) 300 MG tablet Take 300 mg by mouth daily.   azelastine (ASTELIN) 0.1 % nasal spray Place 2 sprays into both nostrils 2 (two) times daily. Use in each nostril as directed   BD PEN NEEDLE NANO 2ND GEN 32G X 4 MM MISC USE  AS DIRECTED AT BEDTIME   blood glucose meter kit and supplies KIT Dispense based on patient and insurance preference. Use up to four times daily as directed.   buPROPion (WELLBUTRIN XL) 150 MG 24 hr tablet Take 1 tablet (150 mg total) by mouth daily.   butalbital-acetaminophen-caffeine (FIORICET) 50-325-40 MG tablet 1 tablet.   carvedilol (COREG) 6.25 MG tablet Take 2 tablets (12.5 mg total) by mouth 2 (two) times daily. Increased from 6.25 mg 2 times daily.   cetirizine (ZYRTEC) 10 MG tablet Take 1 tablet (10 mg total) by mouth daily.   colchicine 0.6 MG tablet Day 1: Take 2 tablets PO for gout flare, repeat 1 tablet one hour following initial dose. Day 2: Take 1 tablet twice daily PO for an additional 2-3 days.   Continuous Glucose Sensor (FREESTYLE LIBRE 3 SENSOR) MISC PLACE 1 SENSOR ON TO THE SKIN EVERY 14 DAYS. USE TO CHECK BLOOD SUGAR CONTINUEOUSLY.   cyclobenzaprine (FLEXERIL) 5 MG tablet Take 1 tablet (5 mg total) by  mouth as needed.   dapagliflozin propanediol (FARXIGA) 10 MG TABS tablet Take 1 tablet (10 mg total) by mouth daily.   FLUoxetine (PROZAC) 20 MG capsule Take 1 capsule (20 mg total) by mouth daily.   fluticasone (FLONASE) 50 MCG/ACT nasal spray Place 2 sprays into both nostrils daily as needed. Home med.   insulin glargine-yfgn (SEMGLEE) 100 UNIT/ML injection Inject into the skin.   Insulin Pen Needle 32G X 4 MM MISC 1 each by Does not apply route in the morning, at noon, in the evening, and at bedtime.   meclizine (ANTIVERT) 25 MG tablet Take 1 tablet (25 mg total) by mouth 3 (three) times daily as needed for dizziness.   metoCLOPramide (REGLAN) 5 MG tablet Take 1 tablet (5 mg total) by mouth every 8 (eight) hours as needed for nausea.   nitroGLYCERIN (NITROSTAT) 0.4 MG SL tablet Place 1 tablet (0.4 mg total) under the tongue every 5 (five) minutes x 3 doses as needed for chest pain.   omeprazole (PRILOSEC) 40 MG capsule Take 1 capsule by mouth once daily   OZEMPIC, 2 MG/DOSE, 8 MG/3ML SOPN INJECT 2MG  SUBCUTANEOUSLY ONCE A WEEK   Peppermint Oil (IBGARD PO) Take 1 tablet by mouth daily.   potassium chloride SA (KLOR-CON M) 20 MEQ tablet Take 1 tablet (20 mEq total) by mouth every other day.   Resmetirom (REZDIFFRA) 100  MG TABS Take by mouth.   REZDIFFRA 100 MG TABS Take by mouth.   rivaroxaban (XARELTO) 20 MG TABS tablet Take 1 tablet (20 mg total) by mouth daily with supper.   rosuvastatin (CRESTOR) 20 MG tablet Take 1 tablet (20 mg total) by mouth daily.   sacubitril-valsartan (ENTRESTO) 97-103 MG Take 1 tablet by mouth 2 (two) times daily. Hold until followup with cardiology due to acute kidney injury.   spironolactone (ALDACTONE) 25 MG tablet Take 1 tablet (25 mg total) by mouth daily. Hold until followup with cardiology due to acute kidney injury.   TALTZ 80 MG/ML SOAJ Inject 3 mLs into the skin every 14 (fourteen) days.   torsemide (DEMADEX) 20 MG tablet Take 2 tablets (40 mg total) by  mouth daily.   umeclidinium-vilanterol (ANORO ELLIPTA) 62.5-25 MCG/ACT AEPB Inhale 1 puff into the lungs daily.   [DISCONTINUED] Continuous Glucose Sensor (FREESTYLE LIBRE 3 SENSOR) MISC 1 Application by Does not apply route every 14 (fourteen) days. Use to check blood sugar continuously.   [DISCONTINUED] dicyclomine (BENTYL) 10 MG capsule Take 2 capsules (20 mg total) by mouth 4 (four) times daily -  before meals and at bedtime.   [DISCONTINUED] fenofibrate (TRICOR) 145 MG tablet Take 1 tablet (145 mg total) by mouth daily.   [DISCONTINUED] icosapent Ethyl (VASCEPA) 1 g capsule Take 2 capsules (2 g total) by mouth 2 (two) times daily.   [DISCONTINUED] insulin aspart (NOVOLOG) 100 UNIT/ML FlexPen Inject 10 Units into the skin 3 (three) times daily with meals. Inject 10 units + correction before breakfast/lunch and supper (correction = 1 unit per 50 mg/dL above 132); max daily dose 36 units   [DISCONTINUED] Insulin Glargine (BASAGLAR KWIKPEN) 100 UNIT/ML Inject 18 Units into the skin at bedtime. Reduced from 38 units.   [DISCONTINUED] naltrexone (DEPADE) 50 MG tablet Take 1 tablet (50 mg total) by mouth daily.   No facility-administered medications prior to visit.    Review of Systems Last CBC Lab Results  Component Value Date   WBC 8.0 07/08/2023   HGB 17.0 07/08/2023   HCT 52.5 (H) 07/08/2023   MCV 83.2 07/08/2023   MCH 26.9 07/08/2023   RDW 14.0 07/08/2023   PLT 191 07/08/2023   Last metabolic panel Lab Results  Component Value Date   GLUCOSE 183 (H) 07/08/2023   NA 138 07/08/2023   K 4.3 07/08/2023   CL 102 07/08/2023   CO2 25 07/08/2023   BUN 20 07/08/2023   CREATININE 1.22 07/08/2023   GFRNONAA >60 07/08/2023   CALCIUM 9.3 07/08/2023   PROT 7.6 06/16/2023   ALBUMIN 4.2 06/16/2023   LABGLOB 3.1 08/09/2022   AGRATIO 1.6 08/09/2022   BILITOT 1.5 (H) 06/16/2023   ALKPHOS 62 06/16/2023   AST 18 06/16/2023   ALT 23 06/16/2023   ANIONGAP 11 07/08/2023   Last lipids Lab  Results  Component Value Date   CHOL 134 04/16/2023   HDL 24 (L) 08/09/2022   LDLCALC 62 08/09/2022   LDLDIRECT 60.6 11/23/2020   TRIG 339 (H) 04/16/2023   CHOLHDL 6.0 (H) 08/09/2022   Last hemoglobin A1c Lab Results  Component Value Date   HGBA1C 6.6 (A) 07/11/2023   Last thyroid functions Lab Results  Component Value Date   TSH 1.508 03/08/2023   T4TOTAL 9.3 01/17/2023     Objective    BP 122/69 (BP Location: Right Arm, Patient Position: Sitting, Cuff Size: Normal)   Pulse 87   Ht 5\' 11"  (1.803 m)   Wt  246 lb (111.6 kg)   SpO2 98%   BMI 34.31 kg/m   BP Readings from Last 3 Encounters:  07/11/23 122/69  07/10/23 107/68  07/08/23 126/80   Wt Readings from Last 3 Encounters:  07/11/23 246 lb (111.6 kg)  07/10/23 250 lb (113.4 kg)  07/08/23 250 lb (113.4 kg)   Physical Exam Vitals and nursing note reviewed.  Constitutional:      Appearance: Normal appearance. He is obese.  HENT:     Head: Normocephalic and atraumatic.  Cardiovascular:     Rate and Rhythm: Normal rate and regular rhythm.     Pulses: Normal pulses.     Heart sounds: Normal heart sounds.  Pulmonary:     Effort: Pulmonary effort is normal.     Breath sounds: Normal breath sounds.  Musculoskeletal:        General: Normal range of motion.     Cervical back: Normal range of motion.  Skin:    General: Skin is warm and dry.     Capillary Refill: Capillary refill takes less than 2 seconds.  Neurological:     General: No focal deficit present.     Mental Status: He is alert and oriented to person, place, and time. Mental status is at baseline.  Psychiatric:        Mood and Affect: Mood normal.        Behavior: Behavior normal.        Thought Content: Thought content normal.        Judgment: Judgment normal.     Results for orders placed or performed in visit on 07/11/23  POCT HgB A1C  Result Value Ref Range   Hemoglobin A1C 6.6 (A) 4.0 - 5.6 %   HbA1c POC (<> result, manual entry)      HbA1c, POC (prediabetic range)     HbA1c, POC (controlled diabetic range)      Assessment & Plan     Problem List Items Addressed This Visit       Cardiovascular and Mediastinum   Atherosclerotic heart disease of native coronary artery with other forms of angina pectoris (HCC)    Chronic, stable F/b cardiology and heart failure teams      Relevant Medications   fenofibrate (TRICOR) 145 MG tablet   Chronic systolic CHF (congestive heart failure) (HCC)    Chronic, request for repeat ECHO Team notified via secure chat Plan for repeat BiPAP titration study to assist CHF      Relevant Medications   fenofibrate (TRICOR) 145 MG tablet   NICM (nonischemic cardiomyopathy) (HCC)    Chronic, stable Continue to monitor      Relevant Medications   fenofibrate (TRICOR) 145 MG tablet   NSVT (nonsustained ventricular tachycardia) (HCC)    Chronic, improved F/b EP and cardiology Defer medication changes      Relevant Medications   fenofibrate (TRICOR) 145 MG tablet     Respiratory   Chronic obstructive pulmonary disease (COPD) (HCC)    Chronic, presumably worse Upcoming titration study Request from pulm for repeat ECHO Team made aware        Endocrine   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  Smaller meal size, adding snacks. Choosing water as drink of choice and increasing purposeful exercise.       Relevant Medications   Insulin Glargine (BASAGLAR KWIKPEN) 100 UNIT/ML   Continuous Glucose Sensor (FREESTYLE LIBRE 3 SENSOR) MISC   Other Relevant Orders   POCT HgB A1C (Completed)   Type 2 diabetes mellitus with hyperlipidemia  (HCC)    Chronic, close to goal recommend diet low in saturated fat and regular exercise - 30 min at least 5 times per week LDL goal of <55; recommend diet low in saturated fat and regular exercise - 30 min at least 5 times per week Continue on previously prescribed medications      Relevant Medications   fenofibrate (TRICOR) 145 MG tablet   Insulin Glargine (BASAGLAR KWIKPEN) 100 UNIT/ML     Other   Class 1 obesity with alveolar hypoventilation, serious comorbidity, and body mass index (BMI) of 34.0 to 34.9 in adult [facility])    Chronic, stable Body mass index is 34.31 kg/m. Continue to monitor      Relevant Medications   naltrexone (DEPADE) 50 MG tablet   Insulin Glargine (BASAGLAR KWIKPEN) 100 UNIT/ML   Depression, recurrent (HCC)    Chronic, improved given symptoms this week/last; variable presentation given chronic illnesses and frequent appts needed Denies SI or HI Continue to monitor    04/16/2023   11:04 AM 03/11/2023    1:45 PM 12/19/2022    8:14 AM  PHQ9 SCORE ONLY  PHQ-9 Total Score 20 1 11       04/16/2023   11:06 AM 06/07/2022    8:45 AM 05/23/2022    8:44 AM 04/25/2022    8:18 AM  GAD 7 : Generalized Anxiety Score  Nervous, Anxious, on Edge 2 0 1 1  Control/stop worrying 2 0 1 1  Worry too much - different things 3 0 1 1  Trouble relaxing 3 0 1 1  Restless 1 0 0 0  Easily annoyed or irritable 3 0 0 0  Afraid - awful might happen 3 0 0 0  Total GAD 7 Score 17 0 4 4  Anxiety Difficulty Somewhat difficult Not difficult at all Somewhat difficult Somewhat difficult          Elevated triglycerides with high cholesterol    Chronic, previously elevated Continue to monitor diet/exercise as well as medication adherence to assist with further CAD/ASCVD prevention      Relevant Medications   fenofibrate (TRICOR) 145 MG tablet   Other Visit Diagnoses     Encounter for immunization       Relevant Orders   Flu vaccine trivalent PF, 6mos and  older(Flulaval,Afluria,Fluarix,Fluzone) (Completed)      No follow-ups on file.     Leilani Merl, FNP, have reviewed all documentation for this visit. The documentation on 07/13/23 for the exam, diagnosis, procedures, and orders are all accurate and complete.  Jacky Kindle, FNP  [facility] [phone] (phone) [phone] (fax)  [facility]

## 2023-07-13 NOTE — Assessment & Plan Note (Signed)
Chronic, stable F/b cardiology and heart failure teams

## 2023-07-13 NOTE — Assessment & Plan Note (Signed)
Chronic, request for repeat ECHO Team notified via secure chat Plan for repeat BiPAP titration study to assist CHF

## 2023-07-13 NOTE — Assessment & Plan Note (Signed)
Chronic, presumably worse Upcoming titration study Request from pulm for repeat ECHO Team made aware

## 2023-07-13 NOTE — Assessment & Plan Note (Signed)
Chronic, stable. Continue to monitor.

## 2023-07-13 NOTE — Assessment & Plan Note (Signed)
Chronic, previously elevated Continue to monitor diet/exercise as well as medication adherence to assist with further CAD/ASCVD prevention

## 2023-07-13 NOTE — Assessment & Plan Note (Signed)
Chronic, improved F/b EP and cardiology Defer medication changes

## 2023-07-13 NOTE — Assessment & Plan Note (Signed)
Chronic, close to goal recommend diet low in saturated fat and regular exercise - 30 min at least 5 times per week LDL goal of <55; recommend diet low in saturated fat and regular exercise - 30 min at least 5 times per week Continue on previously prescribed medications

## 2023-07-13 NOTE — Assessment & Plan Note (Signed)
Chronic, well controlled Reports 8 weeks without insulin d/t supply issues vs pharmacy issues Continues to use CGM; however, has had to change pharmacy to assist given previous failure of sensors Continue to recommend balanced, lower carb meals. Smaller meal size, adding snacks. Choosing water as drink of choice and increasing purposeful exercise.

## 2023-07-13 NOTE — Assessment & Plan Note (Signed)
Chronic, improved given symptoms this week/last; variable presentation given chronic illnesses and frequent appts needed Denies SI or HI Continue to monitor    04/16/2023   11:04 AM 03/11/2023    1:45 PM 12/19/2022    8:14 AM  PHQ9 SCORE ONLY  PHQ-9 Total Score 20 1 11       04/16/2023   11:06 AM 06/07/2022    8:45 AM 05/23/2022    8:44 AM 04/25/2022    8:18 AM  GAD 7 : Generalized Anxiety Score  Nervous, Anxious, on Edge 2 0 1 1  Control/stop worrying 2 0 1 1  Worry too much - different things 3 0 1 1  Trouble relaxing 3 0 1 1  Restless 1 0 0 0  Easily annoyed or irritable 3 0 0 0  Afraid - awful might happen 3 0 0 0  Total GAD 7 Score 17 0 4 4  Anxiety Difficulty Somewhat difficult Not difficult at all Somewhat difficult Somewhat difficult

## 2023-07-13 NOTE — Assessment & Plan Note (Signed)
Chronic, stable Body mass index is 34.31 kg/m. Continue to monitor

## 2023-07-14 ENCOUNTER — Ambulatory Visit: Payer: Medicaid Other | Attending: Otolaryngology

## 2023-07-14 DIAGNOSIS — G4719 Other hypersomnia: Secondary | ICD-10-CM | POA: Insufficient documentation

## 2023-07-14 DIAGNOSIS — G4733 Obstructive sleep apnea (adult) (pediatric): Secondary | ICD-10-CM | POA: Insufficient documentation

## 2023-07-14 DIAGNOSIS — E669 Obesity, unspecified: Secondary | ICD-10-CM | POA: Diagnosis not present

## 2023-07-14 DIAGNOSIS — I10 Essential (primary) hypertension: Secondary | ICD-10-CM | POA: Diagnosis not present

## 2023-07-16 ENCOUNTER — Ambulatory Visit: Payer: Medicaid Other | Admitting: Family Medicine

## 2023-07-18 ENCOUNTER — Other Ambulatory Visit (HOSPITAL_COMMUNITY): Payer: Self-pay

## 2023-07-20 DIAGNOSIS — G4733 Obstructive sleep apnea (adult) (pediatric): Secondary | ICD-10-CM | POA: Diagnosis not present

## 2023-07-20 DIAGNOSIS — J40 Bronchitis, not specified as acute or chronic: Secondary | ICD-10-CM

## 2023-07-20 HISTORY — DX: Bronchitis, not specified as acute or chronic: J40

## 2023-07-22 ENCOUNTER — Other Ambulatory Visit (HOSPITAL_COMMUNITY): Payer: Self-pay

## 2023-07-22 ENCOUNTER — Ambulatory Visit
Admission: RE | Admit: 2023-07-22 | Discharge: 2023-07-22 | Disposition: A | Discharge status: Home / Self Care | Payer: Medicaid Other | Source: Ambulatory Visit | Attending: Student in an Organized Health Care Education/Training Program | Admitting: Student in an Organized Health Care Education/Training Program

## 2023-07-22 ENCOUNTER — Other Ambulatory Visit: Payer: Self-pay

## 2023-07-22 ENCOUNTER — Emergency Department
Admission: EM | Admit: 2023-07-22 | Discharge: 2023-07-22 | Disposition: A | Discharge status: Home / Self Care | Payer: Medicaid Other | Attending: Emergency Medicine | Admitting: Emergency Medicine

## 2023-07-22 ENCOUNTER — Emergency Department: Payer: Medicaid Other

## 2023-07-22 DIAGNOSIS — I509 Heart failure, unspecified: Secondary | ICD-10-CM | POA: Diagnosis not present

## 2023-07-22 DIAGNOSIS — Z7951 Long term (current) use of inhaled steroids: Secondary | ICD-10-CM | POA: Insufficient documentation

## 2023-07-22 DIAGNOSIS — I2510 Atherosclerotic heart disease of native coronary artery without angina pectoris: Secondary | ICD-10-CM | POA: Insufficient documentation

## 2023-07-22 DIAGNOSIS — R7989 Other specified abnormal findings of blood chemistry: Secondary | ICD-10-CM | POA: Insufficient documentation

## 2023-07-22 DIAGNOSIS — Z1152 Encounter for screening for COVID-19: Secondary | ICD-10-CM | POA: Insufficient documentation

## 2023-07-22 DIAGNOSIS — E119 Type 2 diabetes mellitus without complications: Secondary | ICD-10-CM | POA: Insufficient documentation

## 2023-07-22 DIAGNOSIS — R0602 Shortness of breath: Secondary | ICD-10-CM | POA: Insufficient documentation

## 2023-07-22 DIAGNOSIS — R918 Other nonspecific abnormal finding of lung field: Secondary | ICD-10-CM | POA: Diagnosis not present

## 2023-07-22 DIAGNOSIS — J209 Acute bronchitis, unspecified: Secondary | ICD-10-CM | POA: Diagnosis not present

## 2023-07-22 DIAGNOSIS — R0789 Other chest pain: Secondary | ICD-10-CM | POA: Diagnosis not present

## 2023-07-22 DIAGNOSIS — R059 Cough, unspecified: Secondary | ICD-10-CM | POA: Diagnosis present

## 2023-07-22 DIAGNOSIS — J449 Chronic obstructive pulmonary disease, unspecified: Secondary | ICD-10-CM | POA: Insufficient documentation

## 2023-07-22 DIAGNOSIS — R0681 Apnea, not elsewhere classified: Secondary | ICD-10-CM | POA: Diagnosis not present

## 2023-07-22 LAB — CBC
HCT: 49.6 % (ref 39.0–52.0)
Hemoglobin: 16.1 g/dL (ref 13.0–17.0)
MCH: 27 pg (ref 26.0–34.0)
MCHC: 32.5 g/dL (ref 30.0–36.0)
MCV: 83.2 fL (ref 80.0–100.0)
Platelets: 181 10*3/uL (ref 150–400)
RBC: 5.96 MIL/uL — ABNORMAL HIGH (ref 4.22–5.81)
RDW: 14.1 % (ref 11.5–15.5)
WBC: 8.9 10*3/uL (ref 4.0–10.5)
nRBC: 0 % (ref 0.0–0.2)

## 2023-07-22 LAB — BASIC METABOLIC PANEL
Anion gap: 9 (ref 5–15)
BUN: 17 mg/dL (ref 6–20)
CO2: 23 mmol/L (ref 22–32)
Calcium: 9 mg/dL (ref 8.9–10.3)
Chloride: 102 mmol/L (ref 98–111)
Creatinine, Ser: 1.17 mg/dL (ref 0.61–1.24)
GFR, Estimated: >60 mL/min (ref >60)
Glucose, Bld: 167 mg/dL — ABNORMAL HIGH (ref 70–99)
Potassium: 3.7 mmol/L (ref 3.5–5.1)
Sodium: 134 mmol/L — ABNORMAL LOW (ref 135–145)

## 2023-07-22 LAB — TROPONIN I (HIGH SENSITIVITY)
Troponin I (High Sensitivity): 52 ng/L — ABNORMAL HIGH (ref <18)
Troponin I (High Sensitivity): 54 ng/L — ABNORMAL HIGH (ref <18)

## 2023-07-22 LAB — RESP PANEL BY RT-PCR (RSV, FLU A&B, COVID)  RVPGX2
Influenza A by PCR: NEGATIVE
Influenza B by PCR: NEGATIVE
Resp Syncytial Virus by PCR: NEGATIVE
SARS Coronavirus 2 by RT PCR: NEGATIVE

## 2023-07-22 LAB — BRAIN NATRIURETIC PEPTIDE: B Natriuretic Peptide: 454.3 pg/mL — ABNORMAL HIGH (ref 0.0–100.0)

## 2023-07-22 MED ORDER — PREDNISONE 20 MG PO TABS: 60.0000 mg | ORAL_TABLET | Freq: Every day | ORAL | 0 refills | Status: AC

## 2023-07-22 MED ORDER — ALBUTEROL SULFATE (2.5 MG/3ML) 0.083% IN NEBU
2.5000 mg | INHALATION_SOLUTION | Freq: Once | RESPIRATORY_TRACT | Status: AC
  Administered 2023-07-22: 2.5 mg via RESPIRATORY_TRACT
  Filled 2023-07-22: qty 3

## 2023-07-22 MED ORDER — PREDNISONE 20 MG PO TABS
60.0000 mg | ORAL_TABLET | Freq: Once | ORAL | Status: AC
  Administered 2023-07-22: 60 mg via ORAL
  Filled 2023-07-22: qty 3

## 2023-07-22 MED ORDER — GUAIFENESIN 100 MG/5ML PO LIQD
15.0000 mL | Freq: Once | ORAL | Status: AC
  Administered 2023-07-22: 15 mL via ORAL
  Filled 2023-07-22: qty 20

## 2023-07-22 MED ORDER — GUAIFENESIN 200 MG PO TABS: 400.0000 mg | ORAL_TABLET | Freq: Four times a day (QID) | ORAL | 0 refills | Status: DC | PRN

## 2023-07-22 MED ORDER — ALBUTEROL SULFATE (2.5 MG/3ML) 0.083% IN NEBU
2.5000 mg | INHALATION_SOLUTION | Freq: Once | RESPIRATORY_TRACT | Status: AC
Start: 2023-07-22 — End: 2023-07-22
  Administered 2023-07-22: 2.5 mg via RESPIRATORY_TRACT
  Filled 2023-07-22: qty 3

## 2023-07-22 MED ORDER — IPRATROPIUM-ALBUTEROL 0.5-2.5 (3) MG/3ML IN SOLN
3.0000 mL | Freq: Once | RESPIRATORY_TRACT | Status: AC
  Administered 2023-07-22: 3 mL via RESPIRATORY_TRACT
  Filled 2023-07-22: qty 3

## 2023-07-22 NOTE — Discharge Instructions (Addendum)
Use albuterol every 4-6 hours as needed for shortness of breath.  Take the prednisone daily as prescribed for the next 5 days.  Use the guaifenesin as needed for cough.  Follow-up with your primary care provider.  Return to the ER for new, worsening, or persistent severe shortness of breath, chest pain, cough, fever, weakness or any other new or worsening symptoms that concern you.

## 2023-07-22 NOTE — ED Notes (Signed)
Patient states he feels better after 2nd set of neb treatments.

## 2023-07-22 NOTE — ED Provider Notes (Signed)
[facility] Provider Note    Event Date/Time   First MD Initiated Contact with Patient 07/22/23 1504     (approximate)   History   Cough   HPI  Cas Mergel. is a 52 y.o. male with a history of COPD, CAD, pulmonary embolism on anticoagulation, diabetes, and CHF who presents with shortness of breath for the last 2 days associated with cough, nasal congestion, and rhinorrhea.  He denies any vomiting.  He reports some loose stools but states that these are chronic for him.  He denies any acute chest pain.  I reviewed the past medical records per the patient's most recent outpatient counter was with family medicine on 11/22 for follow-up of his diabetes.  He has no recent hospitalizations.   Physical Exam   Triage Vital Signs: ED Triage Vitals  Encounter Vitals Group     BP 07/22/23 1448 (!) 145/82     Systolic BP Percentile --      Diastolic BP Percentile --      Pulse Rate 07/22/23 1448 95     Resp 07/22/23 1448 18     Temp 07/22/23 1448 97.8 F (36.6 C)     Temp Source 07/22/23 1448 Oral     SpO2 07/22/23 1448 98 %     Weight 07/22/23 1449 250 lb (113.4 kg)     Height 07/22/23 1449 5\' 11"  (1.803 m)     Head Circumference --      Peak Flow --      Pain Score 07/22/23 1449 10     Pain Loc --      Pain Education --      Exclude from Growth Chart --     Most recent vital signs: Vitals:   07/22/23 1715 07/22/23 1730  BP:    Pulse: 100 98  Resp:    Temp:    SpO2: 96% 98%    General: Awake, no distress.  CV:  Good peripheral perfusion.  Resp:  Slightly increased effort.  Coarse breath sounds bilaterally.  No rales. Abd:  No distention.  Other:  No peripheral edema.  Nasal congestion.   ED Results / Procedures / Treatments   Labs (all labs ordered are listed, but only abnormal results are displayed) Labs Reviewed  BASIC METABOLIC PANEL - Abnormal; Notable for the following components:      Result Value   Sodium 134 (*)     Glucose, Bld 167 (*)    All other components within normal limits  CBC - Abnormal; Notable for the following components:   RBC 5.96 (*)    All other components within normal limits  BRAIN NATRIURETIC PEPTIDE - Abnormal; Notable for the following components:   B Natriuretic Peptide 454.3 (*)    All other components within normal limits  TROPONIN I (HIGH SENSITIVITY) - Abnormal; Notable for the following components:   Troponin I (High Sensitivity) 52 (*)    All other components within normal limits  TROPONIN I (HIGH SENSITIVITY) - Abnormal; Notable for the following components:   Troponin I (High Sensitivity) 54 (*)    All other components within normal limits  RESP PANEL BY RT-PCR (RSV, FLU A&B, COVID)  [id number]     EKG  ED ECG REPORT I, Dionne Bucy, the attending physician, personally viewed and interpreted this ECG.  Date: 07/22/2023 EKG Time: 1443 Rate: 99 Rhythm: normal sinus rhythm QRS Axis: Right axis Intervals: normal ST/T Wave abnormalities: normal Narrative Interpretation: no evidence  of acute ischemia no significant change when compared to EKG of 06/09/2023    RADIOLOGY  Chest x-ray: I independently viewed and interpreted the images; there are bronchitic changes with no focal consolidation or edema  PROCEDURES:  Critical Care performed: No  Procedures   MEDICATIONS ORDERED IN ED: Medications  albuterol (PROVENTIL) (2.5 MG/3ML) 0.083% nebulizer solution 2.5 mg (2.5 mg Nebulization Given 07/22/23 1543)  albuterol (PROVENTIL) (2.5 MG/3ML) 0.083% nebulizer solution 2.5 mg (2.5 mg Nebulization Given 07/22/23 1543)  guaiFENesin (ROBITUSSIN) 100 MG/5ML liquid 15 mL (15 mLs Oral Given 07/22/23 1541)  ipratropium-albuterol (DUONEB) 0.5-2.5 (3) MG/3ML nebulizer solution 3 mL (3 mLs Nebulization Given 07/22/23 1737)  ipratropium-albuterol (DUONEB) 0.5-2.5 (3) MG/3ML nebulizer solution 3 mL (3 mLs Nebulization Given 07/22/23 1736)  predniSONE (DELTASONE) tablet 60 mg  (60 mg Oral Given 07/22/23 1727)     IMPRESSION / MDM / ASSESSMENT AND PLAN / ED COURSE  I reviewed the triage vital signs and the nursing notes.  52 year old male with PMH as noted above presents with cough, shortness of breath, and nasal congestion for the last 2 days.  Differential diagnosis includes, but is not limited to, bacterial pneumonia, viral bronchitis, COVID-19, other viral syndrome, less likely CHF exacerbation or other cardiac etiology.  There is no clinical evidence for PE given lack of tachycardia, hypoxia, or significant chest pain as well as the presence of nasal congestion and viral symptoms.  We will obtain chest x-ray, lab workup, give bronchodilators, cough medicine, and reassess.  Patient's presentation is most consistent with acute complicated illness / injury requiring diagnostic workup.  ----------------------------------------- 7:11 PM on 07/22/2023 -----------------------------------------  Chest x-ray shows evidence of bronchitis with no consolidation or edema.  Lab workup is overall reassuring.  BNP is minimally elevated.  Troponins are elevated around 50, but this appears to be baseline for the patient.  There was no rise from the first the second.  He is not having any chest pain or active symptoms of ACS.  Rest panel is negative.  The patient continued to have some shortness of breath after the initial round of albuterol.  I ordered DuoNebs, steroid, and now he is feeling significantly better and would like to go home.  He is stable for discharge at this time.  I counseled the patient on the results of the workup and plan of care.  I have prescribed a steroid course as well as cough medicine.  The patient has albuterol at home.  There is no indication for antibiotics at this time.  I gave strict return precautions and he expresses understanding.   FINAL CLINICAL IMPRESSION(S) / ED DIAGNOSES   Final diagnoses:  Acute bronchitis, unspecified organism     Rx  / DC Orders   ED Discharge Orders          Ordered    predniSONE (DELTASONE) 20 MG tablet  Daily        07/22/23 1910    guaiFENesin 200 MG tablet  Every 6 hours PRN        07/22/23 1910             Note:  This document was prepared using Dragon voice recognition software and may include unintentional dictation errors.    Dionne Bucy, MD 07/22/23 [phone]

## 2023-07-22 NOTE — ED Triage Notes (Signed)
Pt sts that he has been having a cough for the last several days. Pt sts that he has been a little SOB and than he is now having CP d/t the coughing.

## 2023-07-22 NOTE — ED Notes (Signed)
Dr. Marisa Severin aware of troponin of 52

## 2023-07-23 ENCOUNTER — Ambulatory Visit: Payer: Medicaid Other | Admitting: Dermatology

## 2023-07-23 NOTE — Telephone Encounter (Signed)
Pharmacy Patient Advocate Encounter  Received notification from [facility] that Prior Authorization for dapagliflozin has been DENIED.  Full denial letter will be uploaded to the media tab. See denial reason below.   PA #/Case ID/Reference #: [id number]

## 2023-07-25 DIAGNOSIS — M75112 Incomplete rotator cuff tear or rupture of left shoulder, not specified as traumatic: Secondary | ICD-10-CM | POA: Insufficient documentation

## 2023-07-25 DIAGNOSIS — M7582 Other shoulder lesions, left shoulder: Secondary | ICD-10-CM | POA: Diagnosis not present

## 2023-07-25 DIAGNOSIS — M7522 Bicipital tendinitis, left shoulder: Secondary | ICD-10-CM | POA: Diagnosis not present

## 2023-07-30 ENCOUNTER — Other Ambulatory Visit: Payer: Self-pay | Admitting: Family Medicine

## 2023-07-30 ENCOUNTER — Other Ambulatory Visit: Payer: Self-pay

## 2023-07-30 ENCOUNTER — Other Ambulatory Visit: Payer: Self-pay | Admitting: Pharmacist

## 2023-07-30 ENCOUNTER — Telehealth: Payer: Self-pay | Admitting: Pharmacist

## 2023-07-30 MED ORDER — OZEMPIC (2 MG/DOSE) 8 MG/3ML ~~LOC~~ SOPN: 2.0000 mg | PEN_INJECTOR | SUBCUTANEOUS | 0 refills | Status: DC

## 2023-07-30 NOTE — Patient Instructions (Signed)
Goals Addressed             This Visit's Progress    Pharmacy Goals       Our goal A1c is less than 7%. This corresponds with fasting sugars less than 130 and 2 hour after meal sugars less than 180. Please continue to use Freestyle Libre 3 Continuous Glucose monitor for blood sugar monitoring.   Estelle Grumbles, PharmD, [facility] [phone]

## 2023-07-30 NOTE — Progress Notes (Signed)
Patient requesting renewal of his Ozempic prescription as current prescription is out of refills.  Would you please consider sending renewal to [facility] for patient?  Thank you!

## 2023-07-30 NOTE — Progress Notes (Signed)
   07/30/2023 Name: Gabriel [name]. MRN: [id number] DOB: [birth year]  Chief Complaint  Patient presents with   Medication Management   Medication Assistance    Gabriel [name]. is a 52 y.o. year old male who was referred to the pharmacist by their PCP for assistance in managing diabetes.    Reach patient by telephone today, but he reports that he is not feeling well today. Reports has had bronchitis and is still feeling short of breath.  - Recommend patient contact office now as expresses interest in scheduling follow up appointment with PCP office. Appointment scheduled for tomorrow, 07/31/2023 at 10:20 am  Call back to patient Patient requests assistance with access for his medications - Will collaborate with clinical team as patient requesting renewal of his Ozempic prescription - Reports has been unable to obtain a refill of his Marcelline Deist as reports was told that the prior authorization for this was denied through his insurance From review of chart/follow up with patient's [facility] plan, find that prior authorization (PA) was submitted for patient for generic dapagliflozin and this was denied as brand name, Marcelline Deist is preferred Submit PA request for [place] via covermymeds today and PA is approved through 07/29/2024 - Update both pharmacy and patient. [facility] confirms cost will be $4 for 90 day supply   He requests to reschedule the rest of our appointment as he is not feeling well.  Follow up Plan: Clinical Pharmacist will follow up with patient by telephone on 08/11/2023 at 9:30 AM   Estelle Grumbles, PharmD, [facility] [phone]

## 2023-07-30 NOTE — Telephone Encounter (Signed)
Please advise 

## 2023-07-31 ENCOUNTER — Ambulatory Visit: Payer: Medicaid Other | Admitting: Family Medicine

## 2023-07-31 ENCOUNTER — Encounter: Payer: Self-pay | Admitting: Family Medicine

## 2023-07-31 VITALS — BP 141/90 | HR 86 | Temp 97.8°F | Resp 18 | Ht 71.0 in | Wt 251.9 lb

## 2023-07-31 DIAGNOSIS — J209 Acute bronchitis, unspecified: Secondary | ICD-10-CM | POA: Insufficient documentation

## 2023-07-31 DIAGNOSIS — R058 Other specified cough: Secondary | ICD-10-CM | POA: Insufficient documentation

## 2023-07-31 DIAGNOSIS — J0100 Acute maxillary sinusitis, unspecified: Secondary | ICD-10-CM | POA: Insufficient documentation

## 2023-07-31 DIAGNOSIS — J449 Chronic obstructive pulmonary disease, unspecified: Secondary | ICD-10-CM | POA: Diagnosis not present

## 2023-07-31 MED ORDER — HYDROCOD POLI-CHLORPHE POLI ER 10-8 MG/5ML PO SUER: 5.0000 mL | Freq: Two times a day (BID) | ORAL | 0 refills | Status: DC | PRN

## 2023-07-31 MED ORDER — AMOXICILLIN-POT CLAVULANATE 875-125 MG PO TABS: 1.0000 | ORAL_TABLET | Freq: Two times a day (BID) | ORAL | 0 refills | Status: DC

## 2023-07-31 MED ORDER — AZELASTINE HCL 0.1 % NA SOLN: 2.0000 | Freq: Two times a day (BID) | NASAL | 12 refills | Status: DC

## 2023-07-31 MED ORDER — IPRATROPIUM-ALBUTEROL 0.5-2.5 (3) MG/3ML IN SOLN: 3.0000 mL | Freq: Four times a day (QID) | RESPIRATORY_TRACT | 11 refills | Status: DC | PRN

## 2023-07-31 MED ORDER — BENZONATATE 100 MG PO CAPS: 100.0000 mg | ORAL_CAPSULE | Freq: Two times a day (BID) | ORAL | 0 refills | Status: DC | PRN

## 2023-07-31 NOTE — Assessment & Plan Note (Signed)
Pt appears to have developed acute sinusitis secondary to bronchitis based on symptoms and time course. Pt has sinus pressure pain bilateral maxillary sinuses and has concerns for general pressure on face.  Has increased production of post nasal drip.  - Augmentin for sinusitis  - Instructed to use nasal saline spray  - Instructed to use azelastine nasal spray to help reduce histamine response  - may also use Flonase nasal spray to help with inflammation in sinus passages

## 2023-07-31 NOTE — Assessment & Plan Note (Addendum)
Pt have developed subacute cough d/t bronchitis. Was previously prescribed prednisone and guaifenesin. Cough not improved. Affecting sleep, daily tasks, and increasing his as needed COPD management. He also is developing rib pain due to persistent cough  - Benzonatate 100 mg tablet as needed BID  - Tussionex 5mL every 12 hrs as needed for cough  - continue mucinex to sputum expectorant  - continue adequate fluids  - nasal saline spray

## 2023-07-31 NOTE — Assessment & Plan Note (Signed)
Discussed option to have 5 day course of prednisone, pt finished 5 day course on 07/28/23 for 60mg  per day for bronchitis - pt would like to hold off, didn't feel it improved his cough and afraid another steroid dosing will affect his DMII - will hold for now. During visit no wheezing or respiratory distress, Sp02 99% Continue use of daily Anoro Ellipta Continue use of rescue inhaler Albuterol Ordered duoneb solution for pt's at home nebulizing treatments

## 2023-07-31 NOTE — Progress Notes (Signed)
Acute Office Visit  Introduced to nurse practitioner role and practice setting.  All questions answered.  Discussed provider/patient relationship and expectations.   Subjective:     Patient ID: Gabriel Beets., male    DOB: 05-13-1971, 52 y.o.   MRN: [id number]  Chief Complaint  Patient presents with   Cough    Patient was seen about 2 weeks and was diagnosed with Bronchitis and still not feeling well. Took Robitussin last night.    Pt presents with concerns for cough for nearly 3 weeks. Was treated for bronchitis on 07/22/23 with guaifenesin and prednisone 60 mg daily for 5 days. Pt continues to has worsening cough, using his albuterol 3 times per day and his anoro ellipta for daily maintenance. Cough is keeping up up at night and causing rib pain. He is also developing headache in front of head and increased congestion in his sinuses. Has ear popping and drainage in back of throat.   Concerns his cough not improving affecting his COPD. He is out of his nebulizing medicated solution would like refill so he can do daily treatments.     Cough This is a recurrent problem. The current episode started 1 to 4 weeks ago. The problem has been unchanged. The problem occurs every few hours. The cough is Productive of purulent sputum. Associated symptoms include ear congestion, headaches, nasal congestion, postnasal drip, rhinorrhea, shortness of breath and wheezing. Pertinent negatives include no chills, ear pain, fever, heartburn, hemoptysis, myalgias, rash, sore throat, sweats or weight loss. Associated symptoms comments: Intermittent shortness of breath with COPD, inhaler improves symptom. He has tried a beta-agonist inhaler, oral steroids, rest and body position changes (Anoro Ellipta) for the symptoms. The treatment provided mild relief. His past medical history is significant for bronchitis, COPD and environmental allergies.    Review of Systems  Constitutional:  Negative for chills,  fever and weight loss.  HENT:  Positive for postnasal drip and rhinorrhea. Negative for ear pain and sore throat.   Respiratory:  Positive for cough, shortness of breath and wheezing. Negative for hemoptysis.   Gastrointestinal:  Negative for heartburn.  Musculoskeletal:  Negative for myalgias.  Skin:  Negative for rash.  Neurological:  Positive for headaches.  Endo/Heme/Allergies:  Positive for environmental allergies.  All other systems reviewed and are negative.     Objective:    BP (!) 141/90 (BP Location: Left Arm, Patient Position: Sitting, Cuff Size: Normal)   Pulse 86   Temp 97.8 F (36.6 C) (Oral)   Resp 18   Ht 5\' 11"  (1.803 m)   Wt 251 lb 14.4 oz (114.3 kg)   SpO2 99%   BMI 35.13 kg/m    Physical Exam Vitals reviewed.  Constitutional:      Appearance: Normal appearance.  HENT:     Head: Normocephalic.     Right Ear: Tympanic membrane normal.     Left Ear: Tympanic membrane normal.     Nose: Congestion and rhinorrhea present.     Right Turbinates: Enlarged.     Left Turbinates: Enlarged.     Right Sinus: Maxillary sinus tenderness present. No frontal sinus tenderness.     Left Sinus: Maxillary sinus tenderness present. No frontal sinus tenderness.     Mouth/Throat:     Mouth: Mucous membranes are moist.     Tongue: No lesions.     Palate: No lesions.     Pharynx: Posterior oropharyngeal erythema and postnasal drip present. No oropharyngeal exudate.  Tonsils: No tonsillar exudate or tonsillar abscesses.  Eyes:     Conjunctiva/sclera: Conjunctivae normal.     Pupils: Pupils are equal, round, and reactive to light.  Cardiovascular:     Rate and Rhythm: Normal rate and regular rhythm.     Pulses: Normal pulses.     Heart sounds: Normal heart sounds.  Pulmonary:     Effort: Pulmonary effort is normal. No tachypnea, prolonged expiration or respiratory distress.     Breath sounds: No stridor. No wheezing or rhonchi.  Musculoskeletal:        General: No  tenderness.     Cervical back: Normal range of motion.  Skin:    General: Skin is warm and dry.     Capillary Refill: Capillary refill takes less than 2 seconds.  Neurological:     General: No focal deficit present.     Mental Status: He is alert. Mental status is at baseline.  Psychiatric:        Mood and Affect: Mood normal.        Behavior: Behavior normal.        Thought Content: Thought content normal.        Judgment: Judgment normal.    No results found for any visits on 07/31/23.     Assessment & Plan:   Problem List Items Addressed This Visit       Respiratory   Chronic obstructive pulmonary disease (COPD) (HCC)   Discussed option to have 5 day course of prednisone, pt finished 5 day course on 07/28/23 for 60mg  per day for bronchitis - pt would like to hold off, didn't feel it improved his cough and afraid another steroid dosing will affect his DMII - will hold for now. During visit no wheezing or respiratory distress, Sp02 99% Continue use of daily Anoro Ellipta Continue use of rescue inhaler Albuterol Ordered duoneb solution for pt's at home nebulizing treatments       Relevant Medications   chlorpheniramine-HYDROcodone (TUSSIONEX) 10-8 MG/5ML   azelastine (ASTELIN) 0.1 % nasal spray   benzonatate (TESSALON) 100 MG capsule   ipratropium-albuterol (DUONEB) 0.5-2.5 (3) MG/3ML SOLN   Acute non-recurrent maxillary sinusitis - Primary   Pt appears to have developed acute sinusitis secondary to bronchitis based on symptoms and time course. Pt has sinus pressure pain bilateral maxillary sinuses and has concerns for general pressure on face.  Has increased production of post nasal drip.  - Augmentin for sinusitis  - Instructed to use nasal saline spray  - Instructed to use azelastine nasal spray to help reduce histamine response  - may also use Flonase nasal spray to help with inflammation in sinus passages      Relevant Medications   chlorpheniramine-HYDROcodone  (TUSSIONEX) 10-8 MG/5ML   azelastine (ASTELIN) 0.1 % nasal spray   benzonatate (TESSALON) 100 MG capsule   amoxicillin-clavulanate (AUGMENTIN) 875-125 MG tablet   Bronchitis, subacute   Relevant Medications   chlorpheniramine-HYDROcodone (TUSSIONEX) 10-8 MG/5ML   benzonatate (TESSALON) 100 MG capsule     Other   Cough with sputum   Pt have developed subacute cough d/t bronchitis. Was previously prescribed prednisone and guaifenesin. Cough not improved. Affecting sleep, daily tasks, and increasing his as needed COPD management. He also is developing rib pain due to persistent cough  - Benzonatate 100 mg tablet as needed BID  - Tussionex 5mL every 12 hrs as needed for cough  - continue mucinex to sputum expectorant  - continue adequate fluids  - nasal saline spray  Relevant Medications   azelastine (ASTELIN) 0.1 % nasal spray      Meds ordered this encounter  Medications   chlorpheniramine-HYDROcodone (TUSSIONEX) 10-8 MG/5ML    Sig: Take 5 mLs by mouth every 12 (twelve) hours as needed for cough.    Dispense:  70 mL    Refill:  0   azelastine (ASTELIN) 0.1 % nasal spray    Sig: Place 2 sprays into both nostrils 2 (two) times daily. Use in each nostril as directed    Dispense:  30 mL    Refill:  12   benzonatate (TESSALON) 100 MG capsule    Sig: Take 1 capsule (100 mg total) by mouth 2 (two) times daily as needed for cough.    Dispense:  20 capsule    Refill:  0   amoxicillin-clavulanate (AUGMENTIN) 875-125 MG tablet    Sig: Take 1 tablet by mouth 2 (two) times daily.    Dispense:  14 tablet    Refill:  0   ipratropium-albuterol (DUONEB) 0.5-2.5 (3) MG/3ML SOLN    Sig: Take 3 mLs by nebulization every 6 (six) hours as needed.    Dispense:  360 mL    Refill:  11    Return if symptoms worsen or fail to improve.  I, Sallee Provencal, FNP, have reviewed all documentation for this visit. The documentation on 07/31/23 for the exam, diagnosis, procedures, and orders are  all accurate and complete.   Sallee Provencal, FNP

## 2023-08-08 ENCOUNTER — Encounter: Payer: Self-pay | Admitting: Medical

## 2023-08-08 ENCOUNTER — Telehealth: Payer: Self-pay | Admitting: Family

## 2023-08-08 ENCOUNTER — Ambulatory Visit: Payer: Medicaid Other | Attending: Medical | Admitting: Medical

## 2023-08-08 VITALS — BP 115/68 | HR 85 | Ht 71.0 in | Wt 248.4 lb

## 2023-08-08 DIAGNOSIS — I10 Essential (primary) hypertension: Secondary | ICD-10-CM

## 2023-08-08 DIAGNOSIS — I5022 Chronic systolic (congestive) heart failure: Secondary | ICD-10-CM | POA: Diagnosis not present

## 2023-08-08 DIAGNOSIS — Z95810 Presence of automatic (implantable) cardiac defibrillator: Secondary | ICD-10-CM | POA: Diagnosis not present

## 2023-08-08 DIAGNOSIS — N1831 Chronic kidney disease, stage 3a: Secondary | ICD-10-CM | POA: Diagnosis not present

## 2023-08-08 DIAGNOSIS — G4733 Obstructive sleep apnea (adult) (pediatric): Secondary | ICD-10-CM

## 2023-08-08 DIAGNOSIS — I25118 Atherosclerotic heart disease of native coronary artery with other forms of angina pectoris: Secondary | ICD-10-CM

## 2023-08-08 DIAGNOSIS — R0609 Other forms of dyspnea: Secondary | ICD-10-CM | POA: Diagnosis not present

## 2023-08-08 MED ORDER — POTASSIUM CHLORIDE CRYS ER 20 MEQ PO TBCR: 20.0000 meq | EXTENDED_RELEASE_TABLET | ORAL | 3 refills | Status: DC

## 2023-08-08 NOTE — Telephone Encounter (Signed)
Patient was seen today in the clinic with provider. Refill was sent into pharmacy.

## 2023-08-08 NOTE — Patient Instructions (Addendum)
Medication Instructions:  No changes at this time.   *If you need a refill on your cardiac medications before your next appointment, please call your pharmacy*   Lab Work: CBC & BMP today  If you have labs (blood work) drawn today and your tests are completely normal, you will receive your results only by: MyChart Message (if you have MyChart) OR A paper copy in the mail If you have any lab test that is abnormal or we need to change your treatment, we will call you to review the results.   Testing/Procedures:  [place] A DEPT OF . [facility] AT [place] [address] [place], [address] [place] [address] Dept: [phone] Loc: [phone]  Plutarco Matusek.  08/08/2023  You are scheduled for a Cardiac Catheterization on Tuesday, December 31 with Dr. Cristal Deer End.  1. Please arrive at the [facility] Entrance of [facility], [address], [place] [address] at 6:30 AM (This is 1 hour(s) prior to your procedure time).  Proceed to the Check-In Desk directly inside the entrance.  Procedure Parking: Use the entrance off of the [address] side of the hospital. Turn right upon entering and follow the driveway to parking that is directly in front of the [facility] Center. There is no valet parking available at this entrance, however there is an awning directly in front of the [facility] Vascular Center for drop off/ pick up for patients.  Special note: Every effort is made to have your procedure done on time. Please understand that emergencies sometimes delay scheduled procedures.  2. Diet: Do not eat solid foods after midnight.  The patient may have clear liquids until 5am upon the day of the procedure.  3. Labs: You will need to have blood drawn CBC & BMP today.   4. Medication instructions in preparation for your procedure:   Contrast Allergy: No  HOLD Torsemide the day of your procedure.   HOLD Farxiga 3 days before your cath NO Insulin the morning of your procedure.   Stop taking Xarelto (Rivaroxaban) on Sunday, December 31.   Take only 18 units of insulin the night before your procedure. Do not take any insulin on the day of the procedure.   On the morning of your procedure, take your Aspirin 81 mg and any morning medicines NOT listed above.  You may use sips of water.  5. Plan to go home the same day, you will only stay overnight if medically necessary. 6. Bring a current list of your medications and current insurance cards. 7. You MUST have a responsible person to drive you home. 8. Someone MUST be with you the first 24 hours after you arrive home or your discharge will be delayed. 9. Please wear clothes that are easy to get on and off and wear slip-on shoes.  Thank you for allowing [place] to care for you!   -- [place] [facility]    Follow-Up: At [facility], you and your health needs are our priority.  As part of our continuing mission to provide you with exceptional heart care, we have created designated Provider Care Teams.  These Care Teams include your primary Cardiologist (physician) and Advanced Practice Providers (APPs -  Physician Assistants and Nurse Practitioners) who all work together to provide you with the care you need, when you need it.    Your next appointment:   3 week(s)  Provider:  Cadence Fransico Michael, PA-C     Dr. Gala Romney with Heart Failure clinic appointment scheduled for 09/22/23 at 10:45 am. If you should need to reschedule this please call [phone]

## 2023-08-08 NOTE — Progress Notes (Signed)
Cardiology Office Note:    Date:  08/08/2023   ID:  Gabriel Beets., DOB [birth year], MRN [id number]  PCP:  Jacky Kindle, FNP  [facility] Cardiologist:  Julien Nordmann, MD  [facility] Electrophysiologist:  Lanier Prude, MD   Referring MD: Jacky Kindle, FNP   Chief Complaint: DOE/chest pain  History of Present Illness:    Gabriel Sabir. is a 52 y.o. 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   Patient was admitted in October 2021 with chest pain and elevated troponin.  Cath showed CTO of the RCA with left-to-right collaterals.  He otherwise had moderate, nonobstructive disease, including a 50% distal stenosis in the LAD, 30% stenosis in the left circumflex, and a 40% OM1 lesion.  He was medically managed.  Follow-up echo in October 2022 in the setting of recurrent hospitalization for heart failure showed EF of 40 to 45%.   The patient was readmitted in February 2023 in April 2023 with heart failure.  He responded well to IV Lasix and at his request, he was discharged home on torsemide 40 mg twice daily.   At an ER visit 01/17/2022 with elevated blood glucose levels.  Troponin was 59 K 2.7.  Creatinine was up from baseline.  He was treated with insulin, IV fluids and potassium.  Echo 02/06/2022 showed LVEF of 30 to 35%.  The patient underwent ICD placement on May 20, 2022.  Echo in December 2023 showed LVEF of 40 to 45%.     Patient was admitted to [facility] 03/07/2023 with dizziness found to be hypotensive and dehydrated.  All his cardiac medications were held.  Blood pressure and kidney function improved and cardiac medications were slowly added back on. Echo 02/2023 LVEF 45-50%, normal RV.   He was last seen by the AHF clinic 05/06/23 and was stable  from a cardiac perspective.   He was seen in the ER 07/22/23 for cough and SOB. BNP 453, HS trop 52>54. CXR showed bronchitis. He was treated with Duonebs and sent home.   Today, the patient reports he has been dealing with bronchitis, cough, SOB and chest pain. Pulmonary recommended he see cardiology. He reports worsening symptoms the last month. Feels chest pain worse from coughing. He has orthopnea. He is having trouble breathing even with CPAP. He takes torsemide 20mg  daily, but is supposed to take 40mg  daily. Says it causes him to urinate all night and does not want to take the higher dose.   Past Medical History:  Diagnosis Date   Acute on chronic combined systolic and diastolic CHF (congestive heart failure) (HCC) 09/02/2017   CAD (coronary artery disease)    a. 04/2015 low risk MV;  b. 12/2016 Cath: minor irregs in LAD/Diag/LCX/OM, RCA 40p/m/d; c. 05/2020 Cath: LM nl, LAD 50d, LCX 30p, OM1 40, RCA 100p w/ L->R collats. CO/CI 3.1/1.3-->Med rx.   Chest wall pain, chronic    Chronic Troponin Elevation    CKD (chronic kidney disease), stage II-III    COPD (chronic obstructive pulmonary disease) (HCC)    Diabetes mellitus without complication (HCC)    HFrEF (heart failure with reduced ejection fraction) (HCC)    a. 03/2015 Echo: EF 45-50%; b. 12/2015 Echo: EF 20-25%; c. 02/2016 Echo: EF 30-35%; d. 11/2016 Echo: EF 40-45%; e.  06/2019 Echo: EF 30-35%; f. 11/2019 Echo: EF 25-30%; g. 05/2020 Echo: EF 35-40%; h. 05/2021 Echo: EF 40-45%; i. 11/2021 Echo: EF 20-25%, glob HK, mild LVH, GrIII DD, sev red RV fxn, mild MR.   Hypertension    Mitral regurgitation    Mild to moderate by October 2021 echocardiogram.   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 03/2015 Echo: EF 45-50%; b. 12/2015 Echo: EF 20-25%; c. 02/2016 Echo: EF 30-35%; d. 11/2016 Echo: EF 40-45%; e. 06/2019 Echo: EF 30-35%; f. 11/2019 Echo: EF 25-30%; g. 05/2020 Echo: EF 35-40%; h. 05/2021 Echo: EF 40-45%; i. 11/2021 Echo: EF  20-25%   Myocardial infarct (HCC)    NSVT (nonsustained ventricular tachycardia) (HCC)    a. 12/2015 noted on tele-->amio;  b. 12/2015 Event monitor: no VT noted.   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  12/31/19: RUL and RLL PEs.   Syncope    a. 01/2016 - felt to be vasovagal.    Past Surgical History:  Procedure Laterality Date   AMPUTATION     CARDIAC CATHETERIZATION     COLONOSCOPY WITH PROPOFOL N/A 08/01/2022   Procedure: COLONOSCOPY WITH PROPOFOL;  Surgeon: Toney Reil, MD;  Location[facility] ENDOSCOPY;  Service: Gastroenterology;  Laterality: N/A;   ESOPHAGOGASTRODUODENOSCOPY (EGD) WITH PROPOFOL N/A 08/01/2022   Procedure: ESOPHAGOGASTRODUODENOSCOPY (EGD) WITH PROPOFOL;  Surgeon: Toney Reil, MD;  Location: [facility] ENDOSCOPY;  Service: Gastroenterology;  Laterality: N/A;   ESOPHAGOGASTRODUODENOSCOPY (EGD) WITH PROPOFOL N/A 10/16/2022   Procedure: ESOPHAGOGASTRODUODENOSCOPY (EGD) WITH PROPOFOL;  Surgeon: Toney Reil, MD;  Location: [facility] ENDOSCOPY;  Service: Gastroenterology;  Laterality: N/A;   FINGER AMPUTATION     Traumatic   FINGER FRACTURE SURGERY Left    FRACTURE SURGERY     LEFT HEART CATH AND CORONARY ANGIOGRAPHY N/A 01/06/2017   Procedure: Left Heart Cath and Coronary Angiography;  Surgeon: Iran Ouch, MD;  Location: [facility] INVASIVE CV LAB;  Service: Cardiovascular;  Laterality: N/A;   RIGHT/LEFT HEART CATH AND CORONARY ANGIOGRAPHY N/A 06/13/2020   Procedure: RIGHT/LEFT HEART CATH AND CORONARY ANGIOGRAPHY;  Surgeon: Yvonne Kendall, MD;  Location: [facility] INVASIVE CV LAB;  Service: Cardiovascular;  Laterality: N/A;   SUBQ ICD IMPLANT N/A 05/20/2022   Procedure: SUBQ ICD IMPLANT;  Surgeon: Lanier Prude, MD;  Location: [facility] INVASIVE CV LAB;  Service: Cardiovascular;  Laterality: N/A;    Current Medications: Current Meds  Medication Sig   Accu-Chek Softclix Lancets  lancets USE UP TO FOUR TIMES DAILY   albuterol (VENTOLIN HFA) 108 (90 Base) MCG/ACT inhaler Inhale 2 puffs into the lungs every 4 (four) hours as needed for wheezing or shortness of breath.   allopurinol (ZYLOPRIM) 300 MG tablet Take 300 mg by mouth daily.   amoxicillin-clavulanate (AUGMENTIN) 875-125 MG tablet Take 1 tablet by mouth 2 (two) times daily.   azelastine (ASTELIN) 0.1 % nasal spray Place 2 sprays into both nostrils 2 (two) times daily. Use in each nostril as directed   BD PEN NEEDLE NANO 2ND GEN 32G X 4 MM MISC USE  AS DIRECTED AT BEDTIME   benzonatate (TESSALON) 100 MG capsule Take 1 capsule (100 mg total) by mouth 2 (two) times daily as needed for cough.   blood glucose meter kit and supplies KIT Dispense based on patient and insurance preference. Use up to four times daily as directed.   buPROPion (WELLBUTRIN XL) 150 MG 24 hr  tablet Take 1 tablet (150 mg total) by mouth daily.   butalbital-acetaminophen-caffeine (FIORICET) 50-325-40 MG tablet 1 tablet.   carvedilol (COREG) 6.25 MG tablet Take 2 tablets (12.5 mg total) by mouth 2 (two) times daily. Increased from 6.25 mg 2 times daily.   cetirizine (ZYRTEC) 10 MG tablet Take 1 tablet (10 mg total) by mouth daily.   chlorpheniramine-HYDROcodone (TUSSIONEX) 10-8 MG/5ML Take 5 mLs by mouth every 12 (twelve) hours as needed for cough.   colchicine 0.6 MG tablet Day 1: Take 2 tablets PO for gout flare, repeat 1 tablet one hour following initial dose. Day 2: Take 1 tablet twice daily PO for an additional 2-3 days.   Continuous Glucose Sensor (FREESTYLE LIBRE 3 SENSOR) MISC PLACE 1 SENSOR ON TO THE SKIN EVERY 14 DAYS. USE TO CHECK BLOOD SUGAR CONTINUEOUSLY.   Continuous Glucose Sensor (FREESTYLE LIBRE 3 SENSOR) MISC 1 Application by Does not apply route every 14 (fourteen) days. Use to check blood sugar continuously.   cyclobenzaprine (FLEXERIL) 5 MG tablet Take 1 tablet (5 mg total) by mouth as needed.   dapagliflozin propanediol (FARXIGA)  10 MG TABS tablet Take 1 tablet (10 mg total) by mouth daily.   fenofibrate (TRICOR) 145 MG tablet Take 1 tablet (145 mg total) by mouth daily.   FLUoxetine (PROZAC) 20 MG capsule Take 1 capsule (20 mg total) by mouth daily.   fluticasone (FLONASE) 50 MCG/ACT nasal spray Place 2 sprays into both nostrils daily as needed. Home med.   guaiFENesin 200 MG tablet Take 2 tablets (400 mg total) by mouth every 6 (six) hours as needed for cough or to loosen phlegm.   Insulin Glargine (BASAGLAR KWIKPEN) 100 UNIT/ML Inject 36 Units into the skin at bedtime.   insulin glargine-yfgn (SEMGLEE) 100 UNIT/ML injection Inject into the skin.   Insulin Pen Needle 32G X 4 MM MISC 1 each by Does not apply route in the morning, at noon, in the evening, and at bedtime.   ipratropium-albuterol (DUONEB) 0.5-2.5 (3) MG/3ML SOLN Take 3 mLs by nebulization every 6 (six) hours as needed.   meclizine (ANTIVERT) 25 MG tablet Take 1 tablet (25 mg total) by mouth 3 (three) times daily as needed for dizziness.   metoCLOPramide (REGLAN) 5 MG tablet Take 1 tablet (5 mg total) by mouth every 8 (eight) hours as needed for nausea.   naltrexone (DEPADE) 50 MG tablet Take 1 tablet (50 mg total) by mouth daily.   nitroGLYCERIN (NITROSTAT) 0.4 MG SL tablet Place 1 tablet (0.4 mg total) under the tongue every 5 (five) minutes x 3 doses as needed for chest pain.   omeprazole (PRILOSEC) 40 MG capsule Take 1 capsule by mouth once daily   Peppermint Oil (IBGARD PO) Take 1 tablet by mouth daily.   Resmetirom (REZDIFFRA) 100 MG TABS Take by mouth.   REZDIFFRA 100 MG TABS Take by mouth.   rivaroxaban (XARELTO) 20 MG TABS tablet Take 1 tablet (20 mg total) by mouth daily with supper.   rosuvastatin (CRESTOR) 20 MG tablet Take 1 tablet (20 mg total) by mouth daily.   sacubitril-valsartan (ENTRESTO) 97-103 MG Take 1 tablet by mouth 2 (two) times daily. Hold until followup with cardiology due to acute kidney injury.   Semaglutide, 2 MG/DOSE, (OZEMPIC,  2 MG/DOSE,) 8 MG/3ML SOPN Inject 2 mg into the skin once a week.   spironolactone (ALDACTONE) 25 MG tablet Take 1 tablet (25 mg total) by mouth daily. Hold until followup with cardiology due to acute kidney injury.  TALTZ 80 MG/ML SOAJ Inject 3 mLs into the skin every 14 (fourteen) days.   torsemide (DEMADEX) 20 MG tablet Take 2 tablets (40 mg total) by mouth daily.   umeclidinium-vilanterol (ANORO ELLIPTA) 62.5-25 MCG/ACT AEPB Inhale 1 puff into the lungs daily.   [DISCONTINUED] potassium chloride SA (KLOR-CON M) 20 MEQ tablet Take 1 tablet (20 mEq total) by mouth every other day.     Allergies:   Metformin and related and Zantac [ranitidine hcl]   Social History   Socioeconomic History   Marital status: Widowed    Spouse name: Not on file   Number of children: 1   Years of education: 64   Highest education level: 12th grade  Occupational History   Occupation: disabled  Tobacco Use   Smoking status: Former    Current packs/day: 0.00    Average packs/day: 0.5 packs/day for 33.0 years (16.5 ttl pk-yrs)    Types: Cigarettes    Start date: 05/09/1987    Quit date: 05/08/2020    Years since quitting: 3.2   Smokeless tobacco: Former    Quit date: 05/08/2020   Tobacco comments:    Quit Sept 2021  Vaping Use   Vaping status: Former  Substance and Sexual Activity   Alcohol use: Not Currently    Comment: occassionally   Drug use: No   Sexual activity: Not Currently  Other Topics Concern   Not on file  Social History Narrative   Lives at home with wife and his dad's wife.        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    Social Drivers of Health   Financial Resource Strain: Medium Risk (08/05/2022)   Overall Financial Resource Strain (CARDIA)    Difficulty of Paying Living Expenses: Somewhat hard  Food Insecurity: No Food Insecurity (03/08/2023)   Hunger Vital Sign    Worried About Running Out of Food in the Last  Year: Never true    Ran Out of Food in the Last Year: Never true  Transportation Needs: No Transportation Needs (03/08/2023)   PRAPARE - Administrator, Civil Service (Medical): No    Lack of Transportation (Non-Medical): No  Physical Activity: Insufficiently Active (01/20/2018)   Exercise Vital Sign    Days of Exercise per Week: 7 days    Minutes of Exercise per Session: 10 min  Stress: Stress Concern Present (09/02/2017)   [name] of Occupational Health - Occupational Stress Questionnaire    Feeling of Stress : Very much  Social Connections: Moderately Isolated (09/02/2017)   Social Connection and Isolation Panel [NHANES]    Frequency of Communication with Friends and Family: Twice a week    Frequency of Social Gatherings with Friends and Family: Once a week    Attends Religious Services: Never    Database administrator or Organizations: No    Attends Banker Meetings: Never    Marital Status: Widowed     Family History: The patient's family history includes Cancer in his maternal grandfather and paternal aunt; Diabetes in his maternal grandfather and mother; Diabetes Mellitus II in his mother; Gout in his father; Heart attack in his mother; Hypertension in his father and mother; Hypothyroidism in his mother; Kidney failure in his mother.  ROS:   Please see the history of present illness.     All other systems reviewed and are negative.  EKGs/Labs/Other Studies Reviewed:    The  following studies were reviewed today:  Echo 02/2023 1. Left ventricular ejection fraction, by estimation, is 45 to 50%. The  left ventricle has mildly decreased function. The left ventricle  demonstrates global hypokinesis. The left ventricular internal cavity size  was mildly to moderately dilated. There  is moderate concentric left ventricular hypertrophy. Left ventricular  diastolic function could not be evaluated.   2. Right ventricular systolic function is normal.  The right ventricular  size is normal. Tricuspid regurgitation signal is inadequate for assessing  PA pressure.   3. The mitral valve is degenerative. Trivial mitral valve regurgitation.  No evidence of mitral stenosis.   4. The aortic valve is calcified. Aortic valve regurgitation is mild.  Aortic valve sclerosis/calcification is present, without any evidence of  aortic stenosis.   5. Aortic dilatation noted. There is mild dilatation of the aortic root,  measuring 43 mm.   Echo 07/2022  1. Left ventricular ejection fraction, by estimation, is 40 to 45%. The  left ventricle has mildly decreased function. The left ventricle  demonstrates regional wall motion abnormalities (see scoring  diagram/findings for description). The left ventricular   internal cavity size was mildly dilated. There is mild left ventricular  hypertrophy. Left ventricular diastolic parameters are consistent with  Grade I diastolic dysfunction (impaired relaxation). There is severe  hypokinesis of the left ventricular,  entire inferior wall and inferolateral wall.   2. Right ventricular systolic function is normal. The right ventricular  size is normal. Tricuspid regurgitation signal is inadequate for assessing  PA pressure.   3. Left atrial size was mildly dilated.   4. The mitral valve is normal in structure. No evidence of mitral valve  regurgitation. No evidence of mitral stenosis.   5. The aortic valve is normal in structure. Aortic valve regurgitation is  mild. Aortic valve sclerosis/calcification is present, without any  evidence of aortic stenosis.   6. Aortic dilatation noted. There is mild dilatation of the aortic root,  measuring 40 mm.   Echo 2022 1. Left ventricular ejection fraction, by estimation, is 40 to 45%. The  left ventricle has mildly decreased function. Left ventricular endocardial  border not optimally defined to evaluate regional wall motion. The left  ventricular internal cavity size   was mildly dilated. There is mild left ventricular hypertrophy. Left  ventricular diastolic parameters were normal.   2. Right ventricular systolic function is normal. The right ventricular  size is normal. Tricuspid regurgitation signal is inadequate for assessing  PA pressure.   3. Left atrial size was mild to moderately dilated.   4. Right atrial size was mildly dilated.   5. The mitral valve is normal in structure. Mild mitral valve  regurgitation. No evidence of mitral stenosis.   6. The aortic valve is normal in structure. Aortic valve regurgitation is  mild. Mild to moderate aortic valve sclerosis/calcification is present,  without any evidence of aortic stenosis.   Cardiac cath 05/2020 Conclusions: Severe single-vessel coronary artery disease with chronic total occlusion of the proximal RCA.  The distal vessel fills via left to right collaterals. Mild to moderate, nonobstructive CAD involving the LAD and LCx with up to 50% stenosis. Mildly elevated left and right heart filling pressures. Moderate pulmonary hypertension (mean PAP 40 mmHg) with significantly elevated pulmonary vascular resistance (PVR 7.4 WU). Severely reduced Fick cardiac output/index.   Recommendations: Continue IV diuresis for at least 1 more day. Given severely reduced cardiac output, I will decrease carvedilol to 6.5 mg twice daily.  If blood pressure tolerates and renal function is stable, consider further escalation of Entresto. Restart IV heparin 2 hours after TR band removal.  If no evidence of bleeding or vascular complication, the patient can be transitioned back to [facility] tomorrow. 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  He was evaluated by Dr. Shirlee Latch in June but failed to follow-up since then. Aggressive secondary prevention of coronary artery disease.  Favor  medical management of CTO of RCA, which is new since 2018.    Yvonne Kendall, MD [facility]   EKG:  EKG is ordered today.  The ekg ordered today demonstrates ST 85bpm, nonspecific T wave changes  Recent Labs: 03/08/2023: TSH 1.508 04/14/2023: Magnesium 2.4 06/16/2023: ALT 23 07/22/2023: B Natriuretic Peptide 454.3; BUN 17; Creatinine, Ser 1.17; Hemoglobin 16.1; Platelets 181; Potassium 3.7; Sodium 134  Recent Lipid Panel    Component Value Date/Time   CHOL 134 04/16/2023 0832   TRIG 339 (H) 04/16/2023 0832   HDL 24 (L) 08/09/2022 0922   CHOLHDL 6.0 (H) 08/09/2022 0922   CHOLHDL 6.5 11/25/2021 0620   VLDL 31 11/25/2021 0620   LDLCALC 62 08/09/2022 0922   LDLDIRECT 60.6 11/23/2020 2101    Physical Exam:    VS:  BP 115/68 (BP Location: Left Arm, Patient Position: Sitting)   Pulse 85   Ht 5\' 11"  (1.803 m)   Wt 248 lb 6.4 oz (112.7 kg)   SpO2 96%   BMI 34.64 kg/m     Wt Readings from Last 3 Encounters:  08/08/23 248 lb 6.4 oz (112.7 kg)  07/31/23 251 lb 14.4 oz (114.3 kg)  07/22/23 250 lb (113.4 kg)     GEN:  Well nourished, well developed in no acute distress HEENT: Normal NECK: No JVD; No carotid bruits LYMPHATICS: No lymphadenopathy CARDIAC: RRR, no murmurs, rubs, gallops RESPIRATORY:  Clear to auscultation without rales, wheezing or rhonchi  ABDOMEN: Soft, non-tender, non-distended MUSCULOSKELETAL:  No edema; No deformity  SKIN: Warm and dry NEUROLOGIC:  Alert and oriented x 3 PSYCHIATRIC:  Normal affect   ASSESSMENT:    1. DOE (dyspnea on exertion)   2. Chronic systolic heart failure (HCC)   3. Implantable cardioverter-defibrillator (ICD) in situ   4. Coronary artery disease of native artery of native heart with stable angina pectoris (HCC)   5. Essential hypertension   6. Stage 3a chronic kidney disease (HCC)   7. OSA (obstructive sleep apnea)    PLAN:    In order of problems listed above:  DO/chest pain Chronic HFrEF/mixed ICM/NICM - mixed  ICM/NICM - echo 01/2022 LVEF 30-35% - Echo 07/2022 LVEF 40-45% - Echo 02/2023 LVEF 45-50% - patient reports worsening DOE and SOB which is likely multifactorial given COPD, OSA, CHF, deconditioning, recent bronchitis/sinusitis - says he decreased Torsemide to 20mg  daily 1 month ago. Reports worsening cough and SOB since then. Saw pulmonology who recommended possible heart cath. Says he has not been using his CPAP nightly due to recent illness. - In the ER BNP 400s, which may be normal - patient is not wanting to take higher dose of Torsemide due to frequent urination - continue Torsemide 20mg  daily - continue Entresto 97/103mg BID - continue [place] 10mg  daily - continue spiro 25mg  dialy - continue Coreg 12.5mg BID - on exam he does not appear volume up, however  I recommended he take higher Torsemide dose 40mg  daily (as previously prescribed), but he is  reluctant. I would rather monitor symptoms at this point, but patient pushing for a heart cath as recommended by pulmonology, so I will order a right and left cardiac cath and he will [facility] team  CAD with chronic chest pain - he reports worsening chest pain/DOE as above - he has chronic troponin elevation - last cath in 05/2020 showed occluded RCA w/ otw nonobstructive disease with L>R collaterals - continue ASA/statin - plan as above  HTN - BP is normal, continue current medications  H/o PE/chronic anticoagulation - continue Xarelto>hold prior to cath  CKD stage 3 - stable on recent labs  OSA - reports difficulty using CPAP over the last month - he follows with pulmonology  Disposition: Follow up in 3 week(s) with MD/APP   Shared Decision Making/Informed Consent   Informed Consent   Shared Decision Making/Informed Consent The risks [stroke (1 in 1000), death (1 in 1000), kidney failure [usually temporary] (1 in 500), bleeding (1 in 200), allergic reaction [possibly serious] (1 in 200)], benefits (diagnostic support  and management of coronary artery disease) and alternatives of a cardiac catheterization were discussed in detail with Gabriel [name] and he is willing to proceed.       Signed, Jazzy Parmer Ardelle Lesches  08/08/2023 9:36 AM    [facility]

## 2023-08-09 LAB — BASIC METABOLIC PANEL
BUN/Creatinine Ratio: 15 (ref 9–20)
BUN: 22 mg/dL (ref 6–24)
CO2: 23 mmol/L (ref 20–29)
Calcium: 10.3 mg/dL — ABNORMAL HIGH (ref 8.7–10.2)
Chloride: 104 mmol/L (ref 96–106)
Creatinine, Ser: 1.48 mg/dL — ABNORMAL HIGH (ref 0.76–1.27)
Glucose: 176 mg/dL — ABNORMAL HIGH (ref 70–99)
Potassium: 5.2 mmol/L (ref 3.5–5.2)
Sodium: 143 mmol/L (ref 134–144)
eGFR: 56 mL/min/{1.73_m2} — ABNORMAL LOW (ref >59)

## 2023-08-09 LAB — CBC
Hematocrit: 56 % — ABNORMAL HIGH (ref 37.5–51.0)
Hemoglobin: 17.9 g/dL — ABNORMAL HIGH (ref 13.0–17.7)
MCH: 26.6 pg (ref 26.6–33.0)
MCHC: 32 g/dL (ref 31.5–35.7)
MCV: 83 fL (ref 79–97)
Platelets: 212 10*3/uL (ref 150–450)
RBC: 6.73 x10E6/uL — ABNORMAL HIGH (ref 4.14–5.80)
RDW: 15.3 % (ref 11.6–15.4)
WBC: 7.4 10*3/uL (ref 3.4–10.8)

## 2023-08-11 ENCOUNTER — Other Ambulatory Visit: Payer: Self-pay

## 2023-08-11 ENCOUNTER — Other Ambulatory Visit: Payer: Self-pay | Admitting: Pharmacist

## 2023-08-11 DIAGNOSIS — Z79899 Other long term (current) drug therapy: Secondary | ICD-10-CM

## 2023-08-11 MED ORDER — TORSEMIDE 20 MG PO TABS: 20.0000 mg | ORAL_TABLET | Freq: Every day | ORAL | 3 refills | Status: DC

## 2023-08-11 NOTE — Progress Notes (Signed)
08/11/2023 Name: Gabriel [name]. MRN: [id number] DOB: Jan 15, 1971  Chief Complaint  Patient presents with   Medication Management    Gabriel [name]. is a 52 y.o. year old male who presented for a telephone visit.   They were referred to the pharmacist by their PCP for assistance in managing diabetes.    Patient is participating in a Managed Medicaid Plan:  Yes, [employer]   Subjective:   Care Team: Primary Care Provider: Jacky Kindle, FNP/Clifton, Jennette Kettle, FNP Rheumatologist: Defoor, Lonia Farber, PA; Next Scheduled Visit: 09/16/2023 Pulmonologist: Raechel Chute, MD Heart Failure Specialist: Dolores Patty, MD; Next Scheduled Visit: 10/02/2023 Gastroenterologist: Toney Reil, MD Cardiologist: Julien Nordmann, MD; Scheduled Procedure: 08/19/2023    Medication Access/Adherence  Current Pharmacy:  [facility] [address] ([address]), [place] - [address] [place]) [place] [address] Phone: [phone] Fax: [phone]  [facility] - [place], [place] - [address] [place] [address] Phone: [phone] Fax: [phone]   Patient reports affordability concerns with their medications: No  Patient reports access/transportation concerns to their pharmacy: No  Patient reports adherence concerns with their medications:  No     Reports his respiratory symptoms have improved. Confirms completed course of Augmentin  From review of chart, note patient seen for Office Visit with [facility] on 12/20 - Scheduled for Heart Catheterization on 08/19/2023  Confirms has directions for his medications to follow prior to upcoming heart cath procedure  Diabetes:   Current medications:  - Ozempic 2 mg weekly - Basaglar 36 units daily - Novolog 10 units base + 1 unit correction for every 50 mg/dL above 676 prior to meals -  Farxiga 10 mg daily   Medications tried in the past: metformin - GI intolerance;    Patient uses Freestyle Libre 3 Continuous Glucose Monitor   Review patient's last 14-days of data from LibreView:   Date of Download: 08/11/23 % Time CGM is active: 96% Average Glucose: 197 mg/dL Glucose Management Indicator: 8.0%  Glucose Variability: 26.4% (goal <36%) Time in Goal:  - Time in range 70-180: 46% - Time above range: 54% - Time below range: 0% Observed patterns: reading previously elevated (note patient had been on steroid taper), but over past week readings/time in range significantly improved  Statin therapy: rosuvastatin 20 mg daily   Patient denies recent symptoms of hypoglycemia      Heart Failure/Hypertension:   Current medications:  ACEi/ARB/ARNI: Entresto 97/103 mg twice daily SGLT2i: Farxiga 10 mg daily Beta blocker: carvedilol 6.25 mg twice daily Mineralocorticoid Receptor Antagonist: spironolactone 25 mg dialy Diuretic regimen: torsemide 20 mg daily + potassium 20 mEq every other day   Reports recent home blood pressure reading yesterday ~130/70   Reports occasionally has dizziness if changes positions quickly - Note patient followed by Cardiologist. Reports was seen by [facility] and advised that he does not have vertigo   COPD:  Patient followed by [facility]  Current medications: - Anoro Elipta - 1 puff daily - albuterol HFA - 2 puffs every 4 hours as needed for wheezing/shortness of breath - ipratropium/albuterol nebulizer solution every 6 hours as needed    Objective:  Lab Results  Component Value Date   HGBA1C 6.6 (A) 07/11/2023    Lab Results  Component Value Date   CREATININE 1.48 (H) 08/08/2023   BUN 22 08/08/2023  NA 143 08/08/2023   K 5.2 08/08/2023   CL 104 08/08/2023   CO2 23 08/08/2023    Lab Results  Component Value Date   CHOL 134 04/16/2023   HDL 24 (L) 08/09/2022   LDLCALC 62 08/09/2022    LDLDIRECT 60.6 11/23/2020   TRIG 339 (H) 04/16/2023   CHOLHDL 6.0 (H) 08/09/2022   BP Readings from Last 3 Encounters:  08/08/23 115/68  07/31/23 (!) 141/90  07/22/23 134/81   Pulse Readings from Last 3 Encounters:  08/08/23 85  07/31/23 86  07/22/23 98     Medications Reviewed Today     Reviewed by Manuela Neptune, RPH-CPP (Pharmacist) on 08/11/23 at 1046  Med List Status: <None>   Medication Order Taking? Sig Documenting Provider Last Dose Status Informant  Accu-Chek Softclix Lancets lancets [id number]  USE UP TO FOUR TIMES DAILY [provider]  Active Self  albuterol (VENTOLIN HFA) 108 (90 Base) MCG/ACT inhaler [id number] Yes Inhale 2 puffs into the lungs every 4 (four) hours as needed for wheezing or shortness of breath. Raechel Chute, MD Taking Active Self  allopurinol (ZYLOPRIM) 300 MG tablet [id number] Yes Take 300 mg by mouth daily. [provider] Taking Active Self  aspirin EC 81 MG tablet [id number] Yes Take 81 mg by mouth daily. [provider] Taking Active Self  azelastine (ASTELIN) 0.1 % nasal spray [id number] Yes Place 2 sprays into both nostrils 2 (two) times daily. Use in each nostril as directed Sallee Provencal, FNP Taking Active Self  BD PEN NEEDLE NANO 2ND GEN 32G X 4 MM MISC [id number]  USE  AS DIRECTED AT BEDTIME Jacky Kindle, FNP  Active Self  blood glucose meter kit and supplies KIT [id number]  Dispense based on patient and insurance preference. Use up to four times daily as directed. Jacky Kindle, FNP  Active Self  carvedilol (COREG) 6.25 MG tablet [id number] Yes Take 2 tablets (12.5 mg total) by mouth 2 (two) times daily. Increased from 6.25 mg 2 times daily.  Patient taking differently: Take 6.25 mg by mouth 2 (two) times daily.   Darlin Priestly, MD Taking Active Self  cetirizine (ZYRTEC) 10 MG tablet [id number] Yes Take 1 tablet (10 mg total) by mouth daily. Jacky Kindle, FNP Taking Active Self  colchicine 0.6 MG tablet [id number]   Day 1: Take 2 tablets PO for gout flare, repeat 1 tablet one hour following initial dose. Day 2: Take 1 tablet twice daily PO for an additional 2-3 days. Jacky Kindle, FNP  Active Self  Continuous Glucose Sensor (FREESTYLE LIBRE 3 SENSOR) [place] [id number]  PLACE 1 SENSOR ON TO THE SKIN EVERY 14 DAYS. USE TO CHECK BLOOD SUGAR CONTINUEOUSLY. [provider]  Active Self  Continuous Glucose Sensor (FREESTYLE LIBRE 3 SENSOR) MISC [id number]  1 Application by Does not apply route every 14 (fourteen) days. Use to check blood sugar continuously. Jacky Kindle, FNP  Active Self  dapagliflozin propanediol (FARXIGA) 10 MG TABS tablet [id number] Yes Take 1 tablet (10 mg total) by mouth daily. Delma Freeze, FNP Taking Active Self  fenofibrate (TRICOR) 145 MG tablet [id number] Yes Take 1 tablet (145 mg total) by mouth daily. Jacky Kindle, FNP Taking Active Self  FLUoxetine (PROZAC) 20 MG capsule [id number] Yes Take 1 capsule (20 mg total) by mouth daily. Jacky Kindle, FNP Taking Active Self  fluticasone (FLONASE) 50 MCG/ACT nasal spray [id number] Yes Place 2 sprays into both nostrils daily as needed. Home med.  Patient taking differently: Place 2 sprays into both nostrils daily as needed for allergies or rhinitis. Home med.   Darlin Priestly, MD Taking Active Self  Insulin Glargine [facility]) 100 UNIT/ML [id number] Yes Inject 36 Units into the skin at bedtime. Jacky Kindle, FNP Taking Active Self  insulin lispro (HUMALOG) 100 UNIT/ML injection [id number] Yes Inject 10 Units into the skin 3 (three) times daily before meals. [provider] Taking Active Self  Insulin Pen Needle 32G X 4 MM MISC [id number]  1 each by Does not apply route in the morning, at noon, in the evening, and at bedtime. Jacky Kindle, FNP  Active Self  ipratropium-albuterol (DUONEB) 0.5-2.5 (3) MG/3ML SOLN [id number]  Take 3 mLs by nebulization every 6 (six) hours as needed. Sallee Provencal, FNP  Active Self  meclizine  (ANTIVERT) 25 MG tablet [id number]  Take 1 tablet (25 mg total) by mouth 3 (three) times daily as needed for dizziness.  Patient taking differently: Take 12.5 mg by mouth 3 (three) times daily as needed for dizziness.   Antonieta Iba, MD  Active Self  metoCLOPramide (REGLAN) 5 MG tablet [id number] No Take 1 tablet (5 mg total) by mouth every 8 (eight) hours as needed for nausea.  Patient not taking: Reported on 08/11/2023   Toney Reil, MD Not Taking Active Self  naltrexone (DEPADE) 50 MG tablet [id number] Yes Take 1 tablet (50 mg total) by mouth daily. Jacky Kindle, FNP Taking Active Self  nitroGLYCERIN (NITROSTAT) 0.4 MG SL tablet [id number]  Place 1 tablet (0.4 mg total) under the tongue every 5 (five) minutes x 3 doses as needed for chest pain. Jacky Kindle, FNP  Active Self  omeprazole (PRILOSEC) 40 MG capsule [id number] Yes Take 1 capsule by mouth once daily Vanga, [name], MD Taking Active Self  potassium chloride SA (KLOR-CON M) 20 MEQ tablet [id number] Yes Take 1 tablet (20 mEq total) by mouth every other day. Fransico Michael, Cadence H, PA-C Taking Active Self  REZDIFFRA 100 MG TABS [id number] Yes Take 100 mg by mouth daily. [provider] Taking Active Self  rivaroxaban (XARELTO) 20 MG TABS tablet [id number] Yes Take 1 tablet (20 mg total) by mouth daily with supper. Bensimhon, Bevelyn Buckles, MD Taking Active Self  rosuvastatin (CRESTOR) 20 MG tablet [id number] Yes Take 1 tablet (20 mg total) by mouth daily. Jacky Kindle, FNP Taking Active Self  sacubitril-valsartan (ENTRESTO) 97-103 MG [id number] Yes Take 1 tablet by mouth 2 (two) times daily. Hold until followup with cardiology due to acute kidney injury. Delma Freeze, FNP Taking Active Self  Semaglutide, 2 MG/DOSE, (OZEMPIC, 2 MG/DOSE,) 8 MG/3ML SOPN [id number] Yes Inject 2 mg into the skin once a week. Jacky Kindle, FNP Taking Active Self  spironolactone (ALDACTONE) 25 MG tablet [id number] Yes Take 1 tablet (25 mg total) by  mouth daily. Hold until followup with cardiology due to acute kidney injury. Delma Freeze, FNP Taking Active Self  TALTZ 80 MG/ML SOAJ [id number] Yes Inject 3 mLs into the skin every 28 (twenty-eight) days. [provider] Taking Active Self           Med Note Lenoria Farrier   Fri Aug 08, 2023  3:20 PM)    torsemide (DEMADEX) 20 MG tablet [id number] Yes Take 2 tablets (40 mg total) by mouth daily.  Patient taking differently: Take 20 mg by mouth daily.   Delma Freeze, FNP Taking Active Self  umeclidinium-vilanterol (ANORO ELLIPTA) 62.5-25 MCG/ACT  AEPB [id number] Yes Inhale 1 puff into the lungs daily. Raechel Chute, MD Taking Active Self              Assessment/Plan:   Comprehensive medication review performed; medication list updated in electronic medical record - Patient denies any difficulty with swallowing potassium chloride tablets, but will let office/pharmacist know if has any difficulty in future - Denies currently taking/needing metoclopramide, but plans to discuss in future with GI Specialist - Note patient reports that he has been taking rosuvastatin 40 mg (previous dose). However, note taking rosuvastatin in combination with Rezdiffra may increase the serum concentration of rosuvastatin. Max recommended dose for rosuvastatin in patients taking Rezdiffra is 20 mg/day. Note PCP has sent prescription for rosuvastatin 20 mg to pharmacy for patient on 05/15/2023 Patient confirms will pick up and start taking rosuvastatin 20 mg daily as directed - Reports stopped Wellbutrin as was upsetting his stomach   Diabetes: - Currently uncontrolled but improved  - Reviewed long term cardiovascular and renal outcomes of uncontrolled blood sugar - Reviewed goal A1c, goal fasting, and goal 2 hour post prandial glucose - Have counseled patient on s/s of low blood sugar and how to treat lows Encouraged patient to obtain glucose tablets to carry with him - Recommend to check  glucose continuously using CGM and contact office if needed for readings outside of established parameters or for symptoms     Heart Failure: - Encourage patient to take positional changes slowly - Recommend patient to continue to monitor his home blood pressure and daily weight and contact office if needed for readings outside of established parameters or for symptoms   COPD: - Reviewed appropriate inhaler technique.     Follow Up Plan: Clinical Pharmacist will follow up with patient by telephone again on 10/22/2023 at 9:30 AM    Estelle Grumbles, PharmD, [facility] [phone]

## 2023-08-11 NOTE — Patient Instructions (Signed)
Goals Addressed             This Visit's Progress    Pharmacy Goals       Our goal A1c is less than 7%. This corresponds with fasting sugars less than 130 and 2 hour after meal sugars less than 180. Please continue to use Freestyle Libre 3 Continuous Glucose monitor for blood sugar monitoring.  Check your blood pressure once daily, and any time you have concerning symptoms like headache, chest pain, dizziness, shortness of breath, or vision changes.    To appropriately check your blood pressure, make sure you do the following:  1) Avoid caffeine, exercise, or tobacco products for 30 minutes before checking. Empty your bladder. 2) Sit with your back supported in a flat-backed chair. Rest your arm on something flat (arm of the chair, table, etc). 3) Sit still with your feet flat on the floor, resting, for at least 5 minutes.  4) Check your blood pressure. Take 1-2 readings.  5) Write down these readings and bring with you to any provider appointments.  Bring your home blood pressure machine with you to a provider's office for accuracy comparison at least once a year.   Make sure you take your blood pressure medications before you come to any office visit, even if you were asked to fast for labs.   Estelle Grumbles, PharmD, [facility] [phone]

## 2023-08-18 ENCOUNTER — Other Ambulatory Visit: Payer: Self-pay | Admitting: Gastroenterology

## 2023-08-19 ENCOUNTER — Encounter
Admission: RE | Disposition: A | Discharge status: Home / Self Care | Payer: Medicaid Other | Source: Ambulatory Visit | Attending: Internal Medicine

## 2023-08-19 ENCOUNTER — Encounter: Payer: Self-pay | Admitting: Internal Medicine

## 2023-08-19 ENCOUNTER — Ambulatory Visit (INDEPENDENT_AMBULATORY_CARE_PROVIDER_SITE_OTHER): Payer: Medicaid Other

## 2023-08-19 ENCOUNTER — Other Ambulatory Visit: Payer: Self-pay

## 2023-08-19 ENCOUNTER — Ambulatory Visit
Admission: RE | Admit: 2023-08-19 | Discharge: 2023-08-19 | Disposition: A | Discharge status: Home / Self Care | Payer: Medicaid Other | Source: Ambulatory Visit | Attending: Internal Medicine | Admitting: Internal Medicine

## 2023-08-19 DIAGNOSIS — Z86711 Personal history of pulmonary embolism: Secondary | ICD-10-CM | POA: Diagnosis not present

## 2023-08-19 DIAGNOSIS — N1831 Chronic kidney disease, stage 3a: Secondary | ICD-10-CM | POA: Diagnosis not present

## 2023-08-19 DIAGNOSIS — I2582 Chronic total occlusion of coronary artery: Secondary | ICD-10-CM | POA: Insufficient documentation

## 2023-08-19 DIAGNOSIS — E1122 Type 2 diabetes mellitus with diabetic chronic kidney disease: Secondary | ICD-10-CM | POA: Diagnosis not present

## 2023-08-19 DIAGNOSIS — I428 Other cardiomyopathies: Secondary | ICD-10-CM

## 2023-08-19 DIAGNOSIS — Z794 Long term (current) use of insulin: Secondary | ICD-10-CM | POA: Diagnosis not present

## 2023-08-19 DIAGNOSIS — Z7984 Long term (current) use of oral hypoglycemic drugs: Secondary | ICD-10-CM | POA: Diagnosis not present

## 2023-08-19 DIAGNOSIS — R0609 Other forms of dyspnea: Secondary | ICD-10-CM | POA: Diagnosis not present

## 2023-08-19 DIAGNOSIS — Z7985 Long-term (current) use of injectable non-insulin antidiabetic drugs: Secondary | ICD-10-CM | POA: Insufficient documentation

## 2023-08-19 DIAGNOSIS — Z7982 Long term (current) use of aspirin: Secondary | ICD-10-CM | POA: Insufficient documentation

## 2023-08-19 DIAGNOSIS — I5022 Chronic systolic (congestive) heart failure: Secondary | ICD-10-CM

## 2023-08-19 DIAGNOSIS — I2510 Atherosclerotic heart disease of native coronary artery without angina pectoris: Secondary | ICD-10-CM

## 2023-08-19 DIAGNOSIS — Z7901 Long term (current) use of anticoagulants: Secondary | ICD-10-CM | POA: Insufficient documentation

## 2023-08-19 DIAGNOSIS — Z95810 Presence of automatic (implantable) cardiac defibrillator: Secondary | ICD-10-CM | POA: Insufficient documentation

## 2023-08-19 DIAGNOSIS — I272 Pulmonary hypertension, unspecified: Secondary | ICD-10-CM | POA: Diagnosis not present

## 2023-08-19 DIAGNOSIS — I130 Hypertensive heart and chronic kidney disease with heart failure and stage 1 through stage 4 chronic kidney disease, or unspecified chronic kidney disease: Secondary | ICD-10-CM | POA: Insufficient documentation

## 2023-08-19 DIAGNOSIS — Z79899 Other long term (current) drug therapy: Secondary | ICD-10-CM | POA: Insufficient documentation

## 2023-08-19 DIAGNOSIS — I25118 Atherosclerotic heart disease of native coronary artery with other forms of angina pectoris: Secondary | ICD-10-CM | POA: Diagnosis not present

## 2023-08-19 DIAGNOSIS — R079 Chest pain, unspecified: Secondary | ICD-10-CM | POA: Diagnosis present

## 2023-08-19 DIAGNOSIS — Z87891 Personal history of nicotine dependence: Secondary | ICD-10-CM | POA: Insufficient documentation

## 2023-08-19 DIAGNOSIS — G4733 Obstructive sleep apnea (adult) (pediatric): Secondary | ICD-10-CM | POA: Diagnosis not present

## 2023-08-19 DIAGNOSIS — J449 Chronic obstructive pulmonary disease, unspecified: Secondary | ICD-10-CM | POA: Insufficient documentation

## 2023-08-19 DIAGNOSIS — R0602 Shortness of breath: Secondary | ICD-10-CM | POA: Diagnosis present

## 2023-08-19 DIAGNOSIS — I5042 Chronic combined systolic (congestive) and diastolic (congestive) heart failure: Secondary | ICD-10-CM | POA: Diagnosis not present

## 2023-08-19 HISTORY — PX: RIGHT/LEFT HEART CATH AND CORONARY ANGIOGRAPHY: CATH118266

## 2023-08-19 LAB — POCT I-STAT 7, (LYTES, BLD GAS, ICA,H+H)
Acid-base deficit: 2 mmol/L (ref 0.0–2.0)
Bicarbonate: 23.1 mmol/L (ref 20.0–28.0)
Calcium, Ion: 1.24 mmol/L (ref 1.15–1.40)
HCT: 46 % (ref 39.0–52.0)
Hemoglobin: 15.6 g/dL (ref 13.0–17.0)
O2 Saturation: 92 %
Potassium: 3.9 mmol/L (ref 3.5–5.1)
Sodium: 142 mmol/L (ref 135–145)
TCO2: 24 mmol/L (ref 22–32)
pCO2 arterial: 40.3 mm[Hg] (ref 32–48)
pH, Arterial: 7.367 (ref 7.35–7.45)
pO2, Arterial: 65 mm[Hg] — ABNORMAL LOW (ref 83–108)

## 2023-08-19 LAB — POCT I-STAT EG7
Acid-base deficit: 2 mmol/L (ref 0.0–2.0)
Bicarbonate: 24.5 mmol/L (ref 20.0–28.0)
Calcium, Ion: 1.23 mmol/L (ref 1.15–1.40)
HCT: 47 % (ref 39.0–52.0)
Hemoglobin: 16 g/dL (ref 13.0–17.0)
O2 Saturation: 59 %
Potassium: 3.8 mmol/L (ref 3.5–5.1)
Sodium: 142 mmol/L (ref 135–145)
TCO2: 26 mmol/L (ref 22–32)
pCO2, Ven: 45.5 mm[Hg] (ref 44–60)
pH, Ven: 7.338 (ref 7.25–7.43)
pO2, Ven: 33 mm[Hg] (ref 32–45)

## 2023-08-19 LAB — GLUCOSE, CAPILLARY
Glucose-Capillary: 157 mg/dL — ABNORMAL HIGH (ref 70–99)
Glucose-Capillary: 124 mg/dL — ABNORMAL HIGH (ref 70–99)

## 2023-08-19 SURGERY — RIGHT/LEFT HEART CATH AND CORONARY ANGIOGRAPHY
Anesthesia: Moderate Sedation | Laterality: Bilateral

## 2023-08-19 MED ORDER — HEPARIN (PORCINE) IN NACL 1000-0.9 UT/500ML-% IV SOLN
INTRAVENOUS | Status: AC
  Filled 2023-08-19: qty 1000

## 2023-08-19 MED ORDER — LIDOCAINE HCL 1 % IJ SOLN
INTRAMUSCULAR | Status: AC
  Filled 2023-08-19: qty 20

## 2023-08-19 MED ORDER — SODIUM CHLORIDE 0.9 % WEIGHT BASED INFUSION: 1.0000 mL/kg/h | INTRAVENOUS | Status: DC

## 2023-08-19 MED ORDER — SODIUM CHLORIDE 0.9 % IV SOLN: 250.0000 mL | INTRAVENOUS | Status: DC | PRN

## 2023-08-19 MED ORDER — FENTANYL CITRATE (PF) 100 MCG/2ML IJ SOLN
INTRAMUSCULAR | Status: DC | PRN
  Administered 2023-08-19: 25 ug via INTRAVENOUS

## 2023-08-19 MED ORDER — LIDOCAINE HCL 1 % IJ SOLN
INTRAMUSCULAR | Status: DC | PRN
  Administered 2023-08-19 (×2): 1 mL

## 2023-08-19 MED ORDER — ONDANSETRON HCL 4 MG/2ML IJ SOLN
4.0000 mg | Freq: Four times a day (QID) | INTRAMUSCULAR | Status: DC | PRN
Start: 2023-08-19 — End: 2023-08-19

## 2023-08-19 MED ORDER — FENTANYL CITRATE (PF) 100 MCG/2ML IJ SOLN
INTRAMUSCULAR | Status: AC
  Filled 2023-08-19: qty 2

## 2023-08-19 MED ORDER — ACETAMINOPHEN 325 MG PO TABS
650.0000 mg | ORAL_TABLET | ORAL | Status: DC | PRN
Start: 2023-08-19 — End: 2023-08-19

## 2023-08-19 MED ORDER — IOHEXOL 300 MG/ML  SOLN
INTRAMUSCULAR | Status: DC | PRN
  Administered 2023-08-19: 21 mL

## 2023-08-19 MED ORDER — SODIUM CHLORIDE 0.9% FLUSH: 3.0000 mL | Freq: Two times a day (BID) | INTRAVENOUS | Status: DC

## 2023-08-19 MED ORDER — MIDAZOLAM HCL 2 MG/2ML IJ SOLN
INTRAMUSCULAR | Status: AC
  Filled 2023-08-19: qty 2

## 2023-08-19 MED ORDER — VERAPAMIL HCL 2.5 MG/ML IV SOLN
INTRAVENOUS | Status: AC
  Filled 2023-08-19: qty 2

## 2023-08-19 MED ORDER — LABETALOL HCL 5 MG/ML IV SOLN: 10.0000 mg | INTRAVENOUS | Status: DC | PRN

## 2023-08-19 MED ORDER — HEPARIN (PORCINE) IN NACL 1000-0.9 UT/500ML-% IV SOLN
INTRAVENOUS | Status: DC | PRN
  Administered 2023-08-19: 1000 mL

## 2023-08-19 MED ORDER — SODIUM CHLORIDE 0.9 % WEIGHT BASED INFUSION
3.0000 mL/kg/h | INTRAVENOUS | Status: AC
  Administered 2023-08-19: 3 mL/kg/h via INTRAVENOUS

## 2023-08-19 MED ORDER — ASPIRIN 81 MG PO CHEW: 81.0000 mg | CHEWABLE_TABLET | ORAL | Status: DC

## 2023-08-19 MED ORDER — SODIUM CHLORIDE 0.9% FLUSH: 3.0000 mL | INTRAVENOUS | Status: DC | PRN

## 2023-08-19 MED ORDER — HEPARIN SODIUM (PORCINE) 1000 UNIT/ML IJ SOLN
INTRAMUSCULAR | Status: DC | PRN
  Administered 2023-08-19: 5000 [IU] via INTRAVENOUS

## 2023-08-19 MED ORDER — ISOSORBIDE MONONITRATE ER 30 MG PO TB24: 15.0000 mg | ORAL_TABLET | Freq: Every day | ORAL | 5 refills | Status: DC

## 2023-08-19 MED ORDER — HEPARIN SODIUM (PORCINE) 1000 UNIT/ML IJ SOLN
INTRAMUSCULAR | Status: AC
  Filled 2023-08-19: qty 10

## 2023-08-19 MED ORDER — VERAPAMIL HCL 2.5 MG/ML IV SOLN
INTRAVENOUS | Status: DC | PRN
  Administered 2023-08-19 (×2): 2.5 mg via INTRA_ARTERIAL

## 2023-08-19 MED ORDER — MIDAZOLAM HCL 2 MG/2ML IJ SOLN
INTRAMUSCULAR | Status: DC | PRN
  Administered 2023-08-19: 1 mg via INTRAVENOUS

## 2023-08-19 MED ORDER — HYDRALAZINE HCL 20 MG/ML IJ SOLN: 10.0000 mg | INTRAMUSCULAR | Status: DC | PRN

## 2023-08-19 SURGICAL SUPPLY — 12 items
CATH 5F 110X4 TIG (CATHETERS) IMPLANT
CATH BALLN WEDGE 5F 110CM (CATHETERS) IMPLANT
DEVICE RAD TR BAND REGULAR (VASCULAR PRODUCTS) IMPLANT
DRAPE BRACHIAL (DRAPES) IMPLANT
GLIDESHEATH SLEND SS 6F .021 (SHEATH) IMPLANT
GUIDEWIRE INQWIRE 1.5J.035X260 (WIRE) IMPLANT
INQWIRE 1.5J .035X260CM (WIRE) ×1 IMPLANT
PACK CARDIAC CATH (CUSTOM PROCEDURE TRAY) ×2 IMPLANT
PROTECTION STATION PRESSURIZED (MISCELLANEOUS) ×1 IMPLANT
SET ATX-X65L (MISCELLANEOUS) IMPLANT
SHEATH GLIDE SLENDER 4/5FR (SHEATH) IMPLANT
STATION PROTECTION PRESSURIZED (MISCELLANEOUS) IMPLANT

## 2023-08-19 NOTE — Interval H&P Note (Signed)
 History and Physical Interval Note:  08/19/2023 7:43 AM  Gabriel [name].  has presented today for surgery, with the diagnosis of chest pain and heart failure.  The various methods of treatment have been discussed with the patient and family. After consideration of risks, benefits and other options for treatment, the patient has consented to  Procedure(s): RIGHT/LEFT HEART CATH AND CORONARY ANGIOGRAPHY (Bilateral) as a surgical intervention.  The patient's history has been reviewed, patient examined, no change in status, stable for surgery.  I have reviewed the patient's chart and labs.  Questions were answered to the patient's satisfaction.    Cath Lab Visit (complete for each Cath Lab visit)  Clinical Evaluation Leading to the Procedure:   ACS: No.  Non-ACS:    Anginal Classification: CCS III  Anti-ischemic medical therapy: Minimal Therapy (1 class of medications)  Non-Invasive Test Results: No non-invasive testing performed  Prior CABG: No previous CABG  Naz Denunzio

## 2023-08-20 LAB — CUP PACEART REMOTE DEVICE CHECK
Battery Remaining Percentage: 87 %
Date Time Interrogation Session: 20241231171500
HighPow Impedance: 75 Ohm
Implantable Lead Connection Status: 753985
Implantable Lead Implant Date: 20231002
Implantable Lead Location: 753862
Implantable Lead Model: 3501
Implantable Lead Serial Number: 221766
Implantable Pulse Generator Implant Date: 20231002
Pulse Gen Serial Number: 189685

## 2023-08-21 ENCOUNTER — Encounter: Payer: Self-pay | Admitting: Internal Medicine

## 2023-08-28 DIAGNOSIS — G4733 Obstructive sleep apnea (adult) (pediatric): Secondary | ICD-10-CM | POA: Diagnosis not present

## 2023-08-28 DIAGNOSIS — I10 Essential (primary) hypertension: Secondary | ICD-10-CM | POA: Diagnosis not present

## 2023-08-28 NOTE — Progress Notes (Signed)
 Cardiology Office Note:    Date:  08/29/2023   ID:  Gabriel [name]., DOB 05-Apr-1971, MRN [id number]  PCP:  [name] LABOR, FNP  [facility] Cardiologist:  [name], MD  [facility] Electrophysiologist:  [name], MD   Referring MD: Emilio Kelly ONEIDA, FNP   Chief Complaint: heart cath follow-up  History of Present Illness:    Gabriel [name]. is a 53 y.o. 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   Patient was admitted in October 2021 with chest pain and elevated troponin.  Cath showed CTO of the RCA with left-to-right collaterals.  He otherwise had moderate, nonobstructive disease, including a 50% distal stenosis in the LAD, 30% stenosis in the left circumflex, and a 40% OM1 lesion.  He was medically managed.  Follow-up echo in October 2022 in the setting of recurrent hospitalization for heart failure showed EF of 40 to 45%.   The patient was readmitted in February 2023 in April 2023 with heart failure.  He responded well to IV Lasix  and at his request, he was discharged home on torsemide  40 mg twice daily.   At an ER visit 01/17/2022 with elevated blood glucose levels.  Troponin was 59 K 2.7.  Creatinine was up from baseline.  He was treated with insulin , IV fluids and potassium.  Echo 02/06/2022 showed LVEF of 30 to 35%.  The patient underwent ICD placement on May 20, 2022.  Echo in December 2023 showed LVEF of 40 to 45%.     Patient was admitted to [facility] 03/07/2023 with dizziness found to be hypotensive and dehydrated.  All his cardiac medications were held.  Blood pressure and kidney function improved and cardiac medications were slowly added back on. Echo 02/2023 LVEF 45-50%, normal RV.   He was seen in the ER 07/22/23 for cough and SOB.  BNP 453, HS trop 52>54. CXR showed bronchitis. He was treated with Duonebs and sent home.   Patient was last seen in December 2024 reporting he was dealing with bronchitis cough, shortness of breath and chest pain.  Patient was taking torsemide  20 mg daily, but was supposed to be taking 40 mg daily.  Patient was wanting to pursue heart cath, so patient was set up for right and left heart cath.  Heart cath showed relatively stable appearance of coronary artery since last cath in 2021.  He has chronically occluded proximal RCA and now with antegrade flow, although suspected he has bridging with collaterals.  Left-to-right collaterals remain as well as moderate LAD and left circumflex disease.  LVEDP was mildly elevated at 20 mmHg.  Moderate pulmonary hypertension with a mean PA of 41 mmHg.  Moderately reduced cardiac output and index.  Patient was started on Imdur .  Today, the patient reports he is feeling OK. He denies difference in chest pain or SOB. No lower leg edema. He takes torsemide  20mg  daily. Patient feels he is too dry when he takes the torsemide  40mg  daily.     Past Medical History:  Diagnosis Date   Acute on chronic combined systolic and diastolic CHF (congestive heart failure) (HCC) 09/02/2017   CAD (coronary artery disease)    a. 04/2015 low risk MV;  b. 12/2016 Cath: minor irregs in LAD/Diag/LCX/OM, RCA 40p/m/d; c. 05/2020 Cath: LM nl, LAD  50d, LCX 30p, OM1 40, RCA 100p w/ L->R collats. CO/CI 3.1/1.3-->Med rx.   Chest wall pain, chronic    Chronic Troponin Elevation    CKD (chronic kidney disease), stage II-III    COPD (chronic obstructive pulmonary disease) (HCC)    Diabetes mellitus without complication (HCC)    HFrEF (heart failure with reduced ejection fraction) (HCC)    a. 03/2015 Echo: EF 45-50%; b. 12/2015 Echo: EF 20-25%; c. 02/2016 Echo: EF 30-35%; d. 11/2016 Echo: EF 40-45%; e. 06/2019 Echo: EF 30-35%; f. 11/2019 Echo: EF 25-30%; g. 05/2020 Echo: EF 35-40%; h. 05/2021 Echo: EF  40-45%; i. 11/2021 Echo: EF 20-25%, glob HK, mild LVH, GrIII DD, sev red RV fxn, mild MR.   Hypertension    Mitral regurgitation    Mild to moderate by October 2021 echocardiogram.   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 03/2015 Echo: EF 45-50%; b. 12/2015 Echo: EF 20-25%; c. 02/2016 Echo: EF 30-35%; d. 11/2016 Echo: EF 40-45%; e. 06/2019 Echo: EF 30-35%; f. 11/2019 Echo: EF 25-30%; g. 05/2020 Echo: EF 35-40%; h. 05/2021 Echo: EF 40-45%; i. 11/2021 Echo: EF 20-25%   Myocardial infarct (HCC)    NSVT (nonsustained ventricular tachycardia) (HCC)    a. 12/2015 noted on tele-->amio;  b. 12/2015 Event monitor: no VT noted.   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  12/31/19: RUL and RLL PEs.   Syncope    a. 01/2016 - felt to be vasovagal.    Past Surgical History:  Procedure Laterality Date   AMPUTATION     CARDIAC CATHETERIZATION     COLONOSCOPY WITH PROPOFOL  N/A 08/01/2022   Procedure: COLONOSCOPY WITH PROPOFOL ;  Surgeon: Unk Corinn Skiff, MD;  Location: [facility] ENDOSCOPY;  Service: Gastroenterology;  Laterality: N/A;   ESOPHAGOGASTRODUODENOSCOPY (EGD) WITH PROPOFOL  N/A 08/01/2022   Procedure: ESOPHAGOGASTRODUODENOSCOPY (EGD) WITH PROPOFOL ;  Surgeon: Unk Corinn Skiff, MD;  Location: [facility] ENDOSCOPY;  Service: Gastroenterology;  Laterality: N/A;   ESOPHAGOGASTRODUODENOSCOPY (EGD) WITH PROPOFOL  N/A 10/16/2022   Procedure: ESOPHAGOGASTRODUODENOSCOPY (EGD) WITH PROPOFOL ;  Surgeon: Unk Corinn Skiff, MD;  Location: [facility] ENDOSCOPY;  Service: Gastroenterology;  Laterality: N/A;   FINGER AMPUTATION     Traumatic   FINGER FRACTURE SURGERY Left    FRACTURE SURGERY     LEFT HEART CATH AND CORONARY ANGIOGRAPHY N/A 01/06/2017   Procedure: Left Heart Cath and Coronary Angiography;  Surgeon: Darron Deatrice LABOR, MD;  Location: [facility] INVASIVE CV LAB;  Service: Cardiovascular;  Laterality: N/A;   RIGHT/LEFT HEART  CATH AND CORONARY ANGIOGRAPHY N/A 06/13/2020   Procedure: RIGHT/LEFT HEART CATH AND CORONARY ANGIOGRAPHY;  Surgeon: Mady Bruckner, MD;  Location: [facility] INVASIVE CV LAB;  Service: Cardiovascular;  Laterality: N/A;   RIGHT/LEFT HEART CATH AND CORONARY ANGIOGRAPHY Bilateral 08/19/2023   Procedure: RIGHT/LEFT HEART CATH AND CORONARY ANGIOGRAPHY;  Surgeon: Mady Bruckner, MD;  Location: [facility] INVASIVE CV LAB;  Service: Cardiovascular;  Laterality: Bilateral;   SUBQ ICD IMPLANT N/A 05/20/2022   Procedure: SUBQ ICD IMPLANT;  Surgeon: Cindie Ole DASEN, MD;  Location: [facility] INVASIVE CV LAB;  Service: Cardiovascular;  Laterality: N/A;    Current Medications: Current Meds  Medication Sig   Accu-Chek Softclix Lancets lancets USE UP TO FOUR TIMES DAILY   albuterol  (VENTOLIN  HFA) 108 (90 Base) MCG/ACT inhaler Inhale 2 puffs into the lungs every 4 (four) hours as needed for wheezing or shortness of breath.   allopurinol  (ZYLOPRIM ) 300 MG  tablet Take 300 mg by mouth daily.   aspirin  EC 81 MG tablet Take 81 mg by mouth daily.   azelastine  (ASTELIN ) 0.1 % nasal spray Place 2 sprays into both nostrils 2 (two) times daily. Use in each nostril as directed   BD PEN NEEDLE NANO 2ND GEN 32G X 4 MM MISC USE  AS DIRECTED AT BEDTIME   blood glucose meter kit and supplies KIT Dispense based on patient and insurance preference. Use up to four times daily as directed.   carvedilol  (COREG ) 6.25 MG tablet Take 2 tablets (12.5 mg total) by mouth 2 (two) times daily. Increased from 6.25 mg 2 times daily. (Patient taking differently: Take 6.25 mg by mouth 2 (two) times daily.)   cetirizine  (ZYRTEC ) 10 MG tablet Take 1 tablet (10 mg total) by mouth daily.   colchicine  0.6 MG tablet Day 1: Take 2 tablets PO for gout flare, repeat 1 tablet one hour following initial dose. Day 2: Take 1 tablet twice daily PO for an additional 2-3 days.   Continuous Glucose Sensor (FREESTYLE LIBRE 3 SENSOR) MISC PLACE 1 SENSOR ON TO THE SKIN EVERY  14 DAYS. USE TO CHECK BLOOD SUGAR CONTINUEOUSLY.   Continuous Glucose Sensor (FREESTYLE LIBRE 3 SENSOR) MISC 1 Application by Does not apply route every 14 (fourteen) days. Use to check blood sugar continuously.   dapagliflozin  propanediol (FARXIGA ) 10 MG TABS tablet Take 1 tablet (10 mg total) by mouth daily.   fenofibrate  (TRICOR ) 145 MG tablet Take 1 tablet (145 mg total) by mouth daily.   FLUoxetine  (PROZAC ) 20 MG capsule Take 1 capsule (20 mg total) by mouth daily.   fluticasone  (FLONASE ) 50 MCG/ACT nasal spray Place 2 sprays into both nostrils daily as needed. Home med. (Patient taking differently: Place 2 sprays into both nostrils daily as needed for allergies or rhinitis. Home med.)   Insulin  Glargine (BASAGLAR  KWIKPEN) 100 UNIT/ML Inject 36 Units into the skin at bedtime.   insulin  lispro (HUMALOG ) 100 UNIT/ML injection Inject 10 Units into the skin 3 (three) times daily before meals.   Insulin  Pen Needle 32G X 4 MM MISC 1 each by Does not apply route in the morning, at noon, in the evening, and at bedtime.   ipratropium-albuterol  (DUONEB) 0.5-2.5 (3) MG/3ML SOLN Take 3 mLs by nebulization every 6 (six) hours as needed.   isosorbide  mononitrate (IMDUR ) 30 MG 24 hr tablet Take 0.5 tablets (15 mg total) by mouth daily.   meclizine  (ANTIVERT ) 25 MG tablet Take 1 tablet (25 mg total) by mouth 3 (three) times daily as needed for dizziness. (Patient taking differently: Take 12.5 mg by mouth 3 (three) times daily as needed for dizziness.)   metoCLOPramide  (REGLAN ) 5 MG tablet Take 1 tablet (5 mg total) by mouth every 8 (eight) hours as needed for nausea.   naltrexone  (DEPADE) 50 MG tablet Take 1 tablet (50 mg total) by mouth daily.   nitroGLYCERIN  (NITROSTAT ) 0.4 MG SL tablet Place 1 tablet (0.4 mg total) under the tongue every 5 (five) minutes x 3 doses as needed for chest pain.   omeprazole  (PRILOSEC) 40 MG capsule Take 1 capsule by mouth once daily   potassium chloride  SA (KLOR-CON  M) 20 MEQ  tablet Take 1 tablet (20 mEq total) by mouth every other day.   Resmetirom  (REZDIFFRA ) 100 MG TABS TAKE 1 TABLET BY MOUTH DAILY WITH OR WITHOUT FOOD   rivaroxaban  (XARELTO ) 20 MG TABS tablet Take 1 tablet (20 mg total) by mouth daily with supper.  rosuvastatin  (CRESTOR ) 20 MG tablet Take 1 tablet (20 mg total) by mouth daily.   sacubitril -valsartan  (ENTRESTO ) 97-103 MG Take 1 tablet by mouth 2 (two) times daily. Hold until followup with cardiology due to acute kidney injury.   Semaglutide , 2 MG/DOSE, (OZEMPIC , 2 MG/DOSE,) 8 MG/3ML SOPN Inject 2 mg into the skin once a week.   spironolactone  (ALDACTONE ) 25 MG tablet Take 1 tablet (25 mg total) by mouth daily. Hold until followup with cardiology due to acute kidney injury.   TALTZ 80 MG/ML SOAJ Inject 3 mLs into the skin every 28 (twenty-eight) days.   torsemide  (DEMADEX ) 20 MG tablet Take 1 tablet (20 mg total) by mouth daily.   umeclidinium-vilanterol (ANORO ELLIPTA ) 62.5-25 MCG/ACT AEPB Inhale 1 puff into the lungs daily.     Allergies:   Metformin  and related and Zantac  [ranitidine  hcl]   Social History   Socioeconomic History   Marital status: Widowed    Spouse name: Not on file   Number of children: 1   Years of education: 79   Highest education level: 12th grade  Occupational History   Occupation: disabled  Tobacco Use   Smoking status: Former    Current packs/day: 0.00    Average packs/day: 0.5 packs/day for 33.0 years (16.5 ttl pk-yrs)    Types: Cigarettes    Start date: 05/09/1987    Quit date: 05/08/2020    Years since quitting: 3.3   Smokeless tobacco: Former    Quit date: 05/08/2020   Tobacco comments:    Quit Sept 2021  Vaping Use   Vaping status: Former  Substance and Sexual Activity   Alcohol use: Not Currently   Drug use: No   Sexual activity: Not Currently  Other Topics Concern   Not on file  Social History Narrative   Lives at home with wife and his dad's wife.        Works sales/advanced research scientist (life sciences); smoker;  cutting; hx of alcoholism [started 13 year]; quit at age of 22 years.    Social Drivers of Health   Financial Resource Strain: Medium Risk (08/05/2022)   Overall Financial Resource Strain (CARDIA)    Difficulty of Paying Living Expenses: Somewhat hard  Food Insecurity: No Food Insecurity (03/08/2023)   Hunger Vital Sign    Worried About Running Out of Food in the Last Year: Never true    Ran Out of Food in the Last Year: Never true  Transportation Needs: No Transportation Needs (03/08/2023)   PRAPARE - Administrator, Civil Service (Medical): No    Lack of Transportation (Non-Medical): No  Physical Activity: Insufficiently Active (01/20/2018)   Exercise Vital Sign    Days of Exercise per Week: 7 days    Minutes of Exercise per Session: 10 min  Stress: Stress Concern Present (09/02/2017)   [name] of Occupational Health - Occupational Stress Questionnaire    Feeling of Stress : Very much  Social Connections: Moderately Isolated (09/02/2017)   Social Connection and Isolation Panel [NHANES]    Frequency of Communication with Friends and Family: Twice a week    Frequency of Social Gatherings with Friends and Family: Once a week    Attends Religious Services: Never    Database Administrator or Organizations: No    Attends Banker Meetings: Never    Marital Status: Widowed     Family History: The patient's family history includes Cancer in his maternal grandfather and paternal aunt; Diabetes in his maternal grandfather and mother;  Diabetes Mellitus II in his mother; Gout in his father; Heart attack in his mother; Hypertension in his father and mother; Hypothyroidism in his mother; Kidney failure in his mother.  ROS:   Please see the history of present illness.     All other systems reviewed and are negative.  EKGs/Labs/Other Studies Reviewed:    The following studies were reviewed today:  R/L heart cath 07/2023 RIGHT/LEFT HEART CATH AND CORONARY  ANGIOGRAPHY   Conclusion  Conclusions: Relatively stable appearance of the coronary arteries since last catheterization in 2021.  Chronically occluded proximal RCA now has antegrade flow, though I suspect this represents bridging collaterals.  Left-to-right collateral remain as well as moderate LAD and LCx disease. Mildly elevated left heart filling pressures (LVEDP 20 mmHg, PCWP 18 mmHg). Moderate pulmonary hypertension (mean PA 41 mmHg, PVR 5.3 WU). Moderately reduced Fick cardiac output/index (CO 4.3 L/min, CI 1.9 L/min/m^2).   Recommendations: Add isosorbide  mononitrate for antianginal therapy, though I suspect symptoms are driven primarily by heart failure and pulmonary hypertension. Continue current GDMT and torsemide ; continue close follow-up in the heart failure clinic. Continue secondary prevention of coronary artery disease. If no evidence of bleeding or vascular injury at catheterization sites, rivaroxaban  can be resumed this evening.   Lonni Hanson, MD [facility]      EKG:  EKG is ordered today.  The ekg ordered today demonstrates NSR 78 cpm, TWI aVL, no change  Recent Labs: 03/08/2023: TSH 1.508 04/14/2023: Magnesium  2.4 06/16/2023: ALT 23 07/22/2023: B Natriuretic Peptide 454.3 08/08/2023: BUN 22; Creatinine, Ser 1.48; Platelets 212 08/19/2023: Hemoglobin 15.6; Potassium 3.9; Sodium 142  Recent Lipid Panel    Component Value Date/Time   CHOL 134 04/16/2023 0832   TRIG 339 (H) 04/16/2023 0832   HDL 24 (L) 08/09/2022 0922   CHOLHDL 6.0 (H) 08/09/2022 0922   CHOLHDL 6.5 11/25/2021 0620   VLDL 31 11/25/2021 0620   LDLCALC 62 08/09/2022 0922   LDLDIRECT 60.6 11/23/2020 2101    Physical Exam:    VS:  BP 110/65 (BP Location: Left Arm, Patient Position: Sitting, Cuff Size: Normal)   Pulse 76   Ht 5' 11 (1.803 m)   Wt 244 lb 12.8 oz (111 kg)   SpO2 96%   BMI 34.14 kg/m     Wt Readings from Last 3 Encounters:  08/29/23 244 lb 12.8 oz (111 kg)  08/19/23  251 lb 8 oz (114.1 kg)  08/08/23 248 lb 6.4 oz (112.7 kg)     GEN:  Well nourished, well developed in no acute distress HEENT: Normal NECK: No JVD; No carotid bruits LYMPHATICS: No lymphadenopathy CARDIAC: RRR, no murmurs, rubs, gallops RESPIRATORY:  Clear to auscultation without rales, wheezing or rhonchi  ABDOMEN: Soft, non-tender, non-distended MUSCULOSKELETAL:  No edema; No deformity  SKIN: Warm and dry NEUROLOGIC:  Alert and oriented x 3 PSYCHIATRIC:  Normal affect   ASSESSMENT:    1. Chronic systolic heart failure (HCC)   2. NICM (nonischemic cardiomyopathy) (HCC)   3. CAD in native artery   4. Chest pain, unspecified type   5. Essential hypertension   6. Pulmonary embolism, unspecified chronicity, unspecified pulmonary embolism type, unspecified whether acute cor pulmonale present (HCC)   7. Stage 3a chronic kidney disease (HCC)   8. OSA (obstructive sleep apnea)   9. Pulmonary hypertension, unspecified (HCC)    PLAN:    In order of problems listed above:  Chronic HFrEF/mixed ICM/NICM - Echo 01/2022 LVEF 30-35% - Echo 07/2022 LVEF 40-45% - Echo  02/2023 LVEF 45-50% -Patient was previously prescribed 40 mg daily, however has been taking 20 mg daily.  He feels too dry on the 20 mg and that he urinates too much - recent R/L heart cath for DOE/chest pain showed relatively stable appearance of the coronary arteries since last cath in 2021 (report above), mildly elevated LVEDP of 20 mmHg, moderate pulmonary hypertension, moderately reduced Fick cardiac output/index.  Imdur  was added. -Patient reports mildly improved chest pain, breathing is the same -Continue torsemide  20 mg daily, still reluctant to increase dose, but discussed he may need an extra one 1-2 times weekly -Entresto  97/23 mg twice daily -Farxiga  10 mg daily -Spironolactone  25 mg daily -Coreg  12.5 mg twice daily -Patient does not appear significantly volume up on exam.  Patient has follow-up with advanced heart  failure team next month.  CAD with chronic chest pain -Recent left heart cath was fairly similar to prior heart cath in 2021 (report above) -Imdur  was added post cath -Continue aspirin , statin, beta-blocker, and Imdur   Hypertension - blood pressure normal, continue current medications  History of PE/chronic anticoagulation -Continue Xarelto   CKD stage III -Repeat BMET today  OSA Moderate pulmonary HTN - Patient follows with pulmonology - He reports compliance with his CPAP  Disposition: Follow up in 4 month(s) with MD/APP    Signed, Drew Lips VEAR Fishman, PA-C  08/29/2023 8:41 AM    [facility]

## 2023-08-29 ENCOUNTER — Ambulatory Visit: Payer: Medicaid Other | Attending: Medical | Admitting: Medical

## 2023-08-29 ENCOUNTER — Encounter: Payer: Self-pay | Admitting: Medical

## 2023-08-29 VITALS — BP 110/65 | HR 76 | Ht 71.0 in | Wt 244.8 lb

## 2023-08-29 DIAGNOSIS — G4733 Obstructive sleep apnea (adult) (pediatric): Secondary | ICD-10-CM

## 2023-08-29 DIAGNOSIS — I10 Essential (primary) hypertension: Secondary | ICD-10-CM

## 2023-08-29 DIAGNOSIS — I272 Pulmonary hypertension, unspecified: Secondary | ICD-10-CM

## 2023-08-29 DIAGNOSIS — N1831 Chronic kidney disease, stage 3a: Secondary | ICD-10-CM | POA: Diagnosis not present

## 2023-08-29 DIAGNOSIS — I5022 Chronic systolic (congestive) heart failure: Secondary | ICD-10-CM | POA: Diagnosis not present

## 2023-08-29 DIAGNOSIS — I2510 Atherosclerotic heart disease of native coronary artery without angina pectoris: Secondary | ICD-10-CM

## 2023-08-29 DIAGNOSIS — I2699 Other pulmonary embolism without acute cor pulmonale: Secondary | ICD-10-CM

## 2023-08-29 DIAGNOSIS — I428 Other cardiomyopathies: Secondary | ICD-10-CM | POA: Diagnosis not present

## 2023-08-29 DIAGNOSIS — R079 Chest pain, unspecified: Secondary | ICD-10-CM | POA: Diagnosis not present

## 2023-08-29 NOTE — Patient Instructions (Signed)
 Medication Instructions:  No changes at this time.   *If you need a refill on your cardiac medications before your next appointment, please call your pharmacy*   Lab Work: [facility] today here in the office.  If you have labs (blood work) drawn today and your tests are completely normal, you will receive your results only by: MyChart Message (if you have MyChart) OR A paper copy in the mail If you have any lab test that is abnormal or we need to change your treatment, we will call you to review the results.   Testing/Procedures: None   Follow-Up: At [facility], you and your health needs are our priority.  As part of our continuing mission to provide you with exceptional heart care, we have created designated Provider Care Teams.  These Care Teams include your primary Cardiologist (physician) and Advanced Practice Providers (APPs -  Physician Assistants and Nurse Practitioners) who all work together to provide you with the care you need, when you need it.    Your next appointment:   4 month(s)  Provider:   Timothy Gollan, MD or Cadence Franchester, PA-C

## 2023-08-30 LAB — BASIC METABOLIC PANEL
BUN/Creatinine Ratio: 15 (ref 9–20)
BUN: 27 mg/dL — ABNORMAL HIGH (ref 6–24)
CO2: 20 mmol/L (ref 20–29)
Calcium: 9.5 mg/dL (ref 8.7–10.2)
Chloride: 99 mmol/L (ref 96–106)
Creatinine, Ser: 1.86 mg/dL — ABNORMAL HIGH (ref 0.76–1.27)
Glucose: 161 mg/dL — ABNORMAL HIGH (ref 70–99)
Potassium: 4 mmol/L (ref 3.5–5.2)
Sodium: 139 mmol/L (ref 134–144)
eGFR: 42 mL/min/{1.73_m2} — ABNORMAL LOW (ref >59)

## 2023-09-01 ENCOUNTER — Telehealth: Payer: Self-pay | Admitting: Family Medicine

## 2023-09-01 ENCOUNTER — Other Ambulatory Visit: Payer: Self-pay | Admitting: Family Medicine

## 2023-09-01 DIAGNOSIS — J449 Chronic obstructive pulmonary disease, unspecified: Secondary | ICD-10-CM

## 2023-09-01 NOTE — Telephone Encounter (Signed)
 Pt requesting new nebulizer, as machine is broken. Needing new machine. Chronically managed by pulmonary.

## 2023-09-01 NOTE — Telephone Encounter (Signed)
 Pt came in the office and states that his nebulizer is cracked and he is needing a new one.  He is asking for a prescription be sent to [facility] where he gets his nebulizer supplies.  The fax number is [phone].  He would like a prescription for new nebulizer and supplies faxed to the number provided.

## 2023-09-02 ENCOUNTER — Ambulatory Visit: Payer: Self-pay | Admitting: Family

## 2023-09-02 ENCOUNTER — Other Ambulatory Visit: Payer: Self-pay | Admitting: Surgery

## 2023-09-02 NOTE — Telephone Encounter (Signed)
 Kindly place order for Nebulizer unit

## 2023-09-03 ENCOUNTER — Telehealth: Payer: Self-pay | Admitting: Internal Medicine

## 2023-09-03 NOTE — Telephone Encounter (Signed)
   Pre-operative Risk Assessment    Patient Name: Gabriel [name].  DOB: 1971/06/07 MRN: [id number]   Date of last office visit: 08/29/2023 Date of next office visit: 12/26/2023   Request for Surgical Clearance    Procedure:   Shoulder Arthroscopy  Date of Surgery:  Clearance 09/16/23                                Surgeon:  Rico Charters, MD Surgeon's Group or Practice Name:  [facility] Phone number:  [phone] Fax number:  [phone]   Type of Clearance Requested:   - Medical    Type of Anesthesia:  Not Indicated   Additional requests/questions:    SignedGenny Kid Schools   09/03/2023, 2:44 PM

## 2023-09-05 NOTE — Telephone Encounter (Signed)
   Patient Name: Gabriel [name].  DOB: 04-27-1971 MRN: [id number]  Primary Cardiologist: Julien Nordmann, MD  Chart reviewed as part of pre-operative protocol coverage.  Patient was last seen in the office on 08/29/2023 by Cadence Furth, PA-C and was stable from a cardiac standpoint.  Per Cadence Fransico Michael, PA-C, given past medical history and time since last visit, based on ACC/AHA guidelines, Romon Corsino. is at acceptable risk for the planned procedure without further cardiovascular testing.   Regarding ASA therapy, we recommend continuation of ASA throughout the perioperative period.  However, if the surgeon feels that cessation of ASA is required in the perioperative period, it may be stopped 5-7 days prior to surgery with a plan to resume it as soon as felt to be feasible from a surgical standpoint in the post-operative period.  I will route this recommendation to the requesting party via Epic fax function and remove from pre-op pool.  Please call with questions.  Joylene Grapes, NP 09/05/2023, 4:32 PM

## 2023-09-08 ENCOUNTER — Encounter
Admission: RE | Admit: 2023-09-08 | Discharge: 2023-09-08 | Disposition: A | Discharge status: Home / Self Care | Payer: Medicaid Other | Source: Ambulatory Visit | Attending: Surgery | Admitting: Surgery

## 2023-09-08 ENCOUNTER — Other Ambulatory Visit: Payer: Self-pay | Admitting: Cardiovascular Disease

## 2023-09-08 ENCOUNTER — Other Ambulatory Visit: Payer: Self-pay | Admitting: Family

## 2023-09-08 DIAGNOSIS — I25118 Atherosclerotic heart disease of native coronary artery with other forms of angina pectoris: Secondary | ICD-10-CM

## 2023-09-08 DIAGNOSIS — Z01818 Encounter for other preprocedural examination: Secondary | ICD-10-CM

## 2023-09-08 HISTORY — DX: Cerebral infarction, unspecified: I63.9

## 2023-09-08 HISTORY — DX: Obstructive sleep apnea (adult) (pediatric): G47.33

## 2023-09-08 HISTORY — DX: Depression, unspecified: F32.A

## 2023-09-08 HISTORY — DX: Hyperlipidemia, unspecified: E78.5

## 2023-09-08 HISTORY — DX: Localized enlarged lymph nodes: R59.0

## 2023-09-08 HISTORY — DX: Presence of automatic (implantable) cardiac defibrillator: Z95.810

## 2023-09-08 HISTORY — DX: Personal history of Methicillin resistant Staphylococcus aureus infection: Z86.14

## 2023-09-08 HISTORY — DX: Pulmonary hypertension, unspecified: I27.20

## 2023-09-08 HISTORY — DX: Arthropathic psoriasis, unspecified: L40.50

## 2023-09-08 HISTORY — DX: Idiopathic chronic gout, unspecified site, without tophus (tophi): M1A.00X0

## 2023-09-08 HISTORY — DX: Thrombocytopenia, unspecified: D69.6

## 2023-09-08 HISTORY — DX: Long term (current) use of anticoagulants: Z79.01

## 2023-09-08 HISTORY — DX: Polyp of colon: K63.5

## 2023-09-08 HISTORY — DX: Type 2 diabetes mellitus without complications: E11.9

## 2023-09-08 NOTE — Patient Instructions (Addendum)
Your procedure is scheduled on:09-16-23 Tuesday Report to the Registration Desk on the 1st floor of the [facility].Then proceed to the 2nd floor Surgery Desk To find out your arrival time, please call [phone] between 1PM - 3PM on:09-15-23 Monday If your arrival time is 6:00 am, do not arrive before that time as the [facility] entrance doors do not open until 6:00 am.  REMEMBER: Instructions that are not followed completely may result in serious medical risk, up to and including death; or upon the discretion of your surgeon and anesthesiologist your surgery may need to be rescheduled.  Do not eat food after midnight the night before surgery.  No gum chewing or hard candies.  You may however, drink Water up to 2 hours before you are scheduled to arrive for your surgery. Do not drink anything within 2 hours of your scheduled arrival time.  In addition, your doctor has ordered for you to drink the provided:  Gatorade G2 Drinking this carbohydrate drink up to two hours before surgery helps to reduce insulin resistance and improve patient outcomes. Please complete drinking 2 hours before scheduled arrival time.  One week prior to surgery:Stop NOW (09-08-23) Stop Anti-inflammatories (NSAIDS) such as Advil, Aleve, Ibuprofen, Motrin, Naproxen, Naprosyn and Aspirin based products such as Excedrin, Goody's Powder, BC Powder. Stop ANY OVER THE COUNTER supplements until after surgery.  You may however, continue to take Tylenol if needed for pain up until the day of surgery.  Stop dapagliflozin propanediol (FARXIGA) 3 days prior to surgery-Last dose will be on 09-12-23 Friday  Stop rivaroxaban (XARELTO) 3 days prior to surgery-Last dose will be on 09-12-23 Friday  Stop Ozempic 7 days prior to surgery-Do NOT take again until AFTER your surgery  Continue taking all of your other prescription medications up until the day of surgery.  ON THE DAY OF SURGERY ONLY TAKE THESE MEDICATIONS WITH SIPS OF  WATER: -allopurinol (ZYLOPRIM)  -carvedilol (COREG)  -cetirizine (ZYRTEC)  -isosorbide mononitrate (IMDUR)  -omeprazole (PRILOSEC)   Use your Anoro Ellipta and Albuterol Inhaler the day of surgery and bring your Albuterol Inhaler to the hospital  Continue your 81 mg Aspirin up until the day prior to surgery-Do NOT take the morning of surgery  Take half of your Basaglar Insulin (18 units) the night before surgery and NO Insulin the morning of surgery  Ask Dr Binnie Rail office tomorrow (09-09-23) at your visit when you need to stop your rivaroxaban (XARELTO)   No Alcohol for 24 hours before or after surgery.  No Smoking including e-cigarettes for 24 hours before surgery.  No chewable tobacco products for at least 6 hours before surgery.  No nicotine patches on the day of surgery.  Do not use any "recreational" drugs for at least a week (preferably 2 weeks) before your surgery.  Please be advised that the combination of cocaine and anesthesia may have negative outcomes, up to and including death. If you test positive for cocaine, your surgery will be cancelled.  On the morning of surgery brush your teeth with toothpaste and water, you may rinse your mouth with mouthwash if you wish. Do not swallow any toothpaste or mouthwash.  Use CHG Soap as directed on instruction sheet.  Do not wear jewelry, make-up, hairpins, clips or nail polish.  For welded (permanent) jewelry: bracelets, anklets, waist bands, etc.  Please have this removed prior to surgery.  If it is not removed, there is a chance that hospital personnel will need to cut it off on  the day of surgery.  Do not wear lotions, powders, or perfumes.   Do not shave body hair from the neck down 48 hours before surgery.  Contact lenses, hearing aids and dentures may not be worn into surgery.  Do not bring valuables to the hospital. [facility] is not responsible for any missing/lost belongings or valuables.   Bring your Bi-PAP to the  hospital  Notify your doctor if there is any change in your medical condition (cold, fever, infection).  Wear comfortable clothing (specific to your surgery type) to the hospital.  After surgery, you can help prevent lung complications by doing breathing exercises.  Take deep breaths and cough every 1-2 hours. Your doctor may order a device called an Incentive Spirometer to help you take deep breaths. When coughing or sneezing, hold a pillow firmly against your incision with both hands. This is called "splinting." Doing this helps protect your incision. It also decreases belly discomfort.  If you are being admitted to the hospital overnight, leave your suitcase in the car. After surgery it may be brought to your room.  In case of increased patient census, it may be necessary for you, the patient, to continue your postoperative care in the Same Day Surgery department.  If you are being discharged the day of surgery, you will not be allowed to drive home. You will need a responsible individual to drive you home and stay with you for 24 hours after surgery.   If you are taking public transportation, you will need to have a responsible individual with you.  Please call the Pre-admissions Testing Dept. at [phone] if you have any questions about these instructions.  Surgery Visitation Policy:  Patients having surgery or a procedure may have two visitors.  Children under the age of 36 must have an adult with them who is not the patient.  Temporary Visitor Restrictions Due to increasing cases of flu, RSV and COVID-19: Children ages 71 and under will not be able to visit patients in [facility] hospitals under most circumstances.     Preparing for Surgery with CHLORHEXIDINE GLUCONATE (CHG) Soap  Chlorhexidine Gluconate (CHG) Soap  o An antiseptic cleaner that kills germs and bonds with the skin to continue killing germs even after washing  o Used for showering the night before  surgery and morning of surgery  Before surgery, you can play an important role by reducing the number of germs on your skin.  CHG (Chlorhexidine gluconate) soap is an antiseptic cleanser which kills germs and bonds with the skin to continue killing germs even after washing.  Please do not use if you have an allergy to CHG or antibacterial soaps. If your skin becomes reddened/irritated stop using the CHG.  1. Shower the NIGHT BEFORE SURGERY and the MORNING OF SURGERY with CHG soap.  2. If you choose to wash your hair, wash your hair first as usual with your normal shampoo.  3. After shampooing, rinse your hair and body thoroughly to remove the shampoo.  4. Use CHG as you would any other liquid soap. You can apply CHG directly to the skin and wash gently with a scrungie or a clean washcloth.  5. Apply the CHG soap to your body only from the neck down. Do not use on open wounds or open sores. Avoid contact with your eyes, ears, mouth, and genitals (private parts). Wash face and genitals (private parts) with your normal soap.  6. Wash thoroughly, paying special attention to the area  where your surgery will be performed.  7. Thoroughly rinse your body with warm water.  8. Do not shower/wash with your normal soap after using and rinsing off the CHG soap.  9. Pat yourself dry with a clean towel.  10. Wear clean pajamas to bed the night before surgery.  12. Place clean sheets on your bed the night of your first shower and do not sleep with pets.  13. Shower again with the CHG soap on the day of surgery prior to arriving at the hospital.  14. Do not apply any deodorants/lotions/powders.  15. Please wear clean clothes to the hospital.  How to Use an Incentive Spirometer An incentive spirometer is a tool that measures how well you are filling your lungs with each breath. Learning to take long, deep breaths using this tool can help you keep your lungs clear and active. This may help to  reverse or lessen your chance of developing breathing (pulmonary) problems, especially infection. You may be asked to use a spirometer: After a surgery. If you have a lung problem or a history of smoking. After a long period of time when you have been unable to move or be active. If the spirometer includes an indicator to show the highest number that you have reached, your health care provider or respiratory therapist will help you set a goal. Keep a log of your progress as told by your health care provider. What are the risks? Breathing too quickly may cause dizziness or cause you to pass out. Take your time so you do not get dizzy or light-headed. If you are in pain, you may need to take pain medicine before doing incentive spirometry. It is harder to take a deep breath if you are having pain. How to use your incentive spirometer  Sit up on the edge of your bed or on a chair. Hold the incentive spirometer so that it is in an upright position. Before you use the spirometer, breathe out normally. Place the mouthpiece in your mouth. Make sure your lips are closed tightly around it. Breathe in slowly and as deeply as you can through your mouth, causing the piston or the ball to rise toward the top of the chamber. Hold your breath for 3-5 seconds, or for as long as possible. If the spirometer includes a coach indicator, use this to guide you in breathing. Slow down your breathing if the indicator goes above the marked areas. Remove the mouthpiece from your mouth and breathe out normally. The piston or ball will return to the bottom of the chamber. Rest for a few seconds, then repeat the steps 10 or more times. Take your time and take a few normal breaths between deep breaths so that you do not get dizzy or light-headed. Do this every 1-2 hours when you are awake. If the spirometer includes a goal marker to show the highest number you have reached (best effort), use this as a goal to work toward  during each repetition. After each set of 10 deep breaths, cough a few times. This will help to make sure that your lungs are clear. If you have an incision on your chest or abdomen from surgery, place a pillow or a rolled-up towel firmly against the incision when you cough. This can help to reduce pain while taking deep breaths and coughing. General tips When you are able to get out of bed: Walk around often. Continue to take deep breaths and cough in order to clear your  lungs. Keep using the incentive spirometer until your health care provider says it is okay to stop using it. If you have been in the hospital, you may be told to keep using the spirometer at home. Contact a health care provider if: You are having difficulty using the spirometer. You have trouble using the spirometer as often as instructed. Your pain medicine is not giving enough relief for you to use the spirometer as told. You have a fever. Get help right away if: You develop shortness of breath. You develop a cough with bloody mucus from the lungs. You have fluid or blood coming from an incision site after you cough. Summary An incentive spirometer is a tool that can help you learn to take long, deep breaths to keep your lungs clear and active. You may be asked to use a spirometer after a surgery, if you have a lung problem or a history of smoking, or if you have been inactive for a long period of time. Use your incentive spirometer as instructed every 1-2 hours while you are awake. If you have an incision on your chest or abdomen, place a pillow or a rolled-up towel firmly against your incision when you cough. This will help to reduce pain. Get help right away if you have shortness of breath, you cough up bloody mucus, or blood comes from your incision when you cough. This information is not intended to replace advice given to you by your health care provider. Make sure you discuss any questions you have with your health  care provider. Document Revised: 06/13/2023 Document Reviewed: 06/13/2023 Elsevier Patient Education  2024 [employer].

## 2023-09-09 DIAGNOSIS — M75112 Incomplete rotator cuff tear or rupture of left shoulder, not specified as traumatic: Secondary | ICD-10-CM | POA: Diagnosis not present

## 2023-09-10 NOTE — Telephone Encounter (Signed)
Order for new neb machine and supplies was placed.

## 2023-09-11 ENCOUNTER — Other Ambulatory Visit: Payer: Medicaid Other

## 2023-09-12 ENCOUNTER — Encounter: Payer: Self-pay | Admitting: Surgery

## 2023-09-12 DIAGNOSIS — Z7982 Long term (current) use of aspirin: Secondary | ICD-10-CM

## 2023-09-12 NOTE — Progress Notes (Signed)
Perioperative / Anesthesia Services  Pre-Admission Testing Clinical Review / Pre-Operative Anesthesia Consult  Date: 09/12/23  Patient Demographics:  Name: Gabriel [name]. DOB: [birth year] MRN:   [id number]  Planned Surgical Procedure(s):    Case: [id number] Date/Time: 09/16/23 1015   Procedure: SHOULDER ARTHROSCOPY WITH DEBRIDEMENT, DECOMPRESSION, ROTATOR CUFF REPAIR AND BICEPS TENODESIS. (Left: Shoulder)   Anesthesia type: Choice   Pre-op diagnosis:      Tendinitis of upper biceps tendon of left shoulder M75.22     Rotator cuff tendinitis, left M75.82     Nontraumatic incomplete tear of left rotator cuff M75.112   Location: [facility] OR ROOM 02 / [facility]   Surgeons: Christena Flake, MD      NOTE: Available PAT nursing documentation and vital signs have been reviewed. Clinical nursing staff has updated patient's PMH/PSHx, current medication list, and drug allergies/intolerances to ensure comprehensive history available to assist in medical decision making as it pertains to the aforementioned surgical procedure and anticipated anesthetic course. Extensive review of available clinical information personally performed. Gabriel [name] PMH and PSHx updated with any diagnoses/procedures that  may have been inadvertently omitted during his intake with the pre-admission testing department's nursing staff.  Clinical Discussion:  Gabriel [name]. is a 53 y.o. male who is submitted for pre-surgical anesthesia review and clearance prior to him undergoing the above procedure. Patient is a Former Smoker (16.5 pack years; quit 04/2020). Pertinent PMH includes: CAD, NSTEMI, mixed ischemic and nonischemic cardiomyopathy, HFrEF, recurrent pulmonary emboli, NSVT, AICD placement, RIGHT carotid artery disease, aortic dilatation, CVA, HTN, HLD, T2DM, CKD-III, COPD, pulmonary hypertension, OSAH (requires nocturnal PAP therapy) on, thrombocytopenia, psoriasis/psoriatic arthritis, LEFT rotator cuff  tear, depression.  Patient is followed by cardiology Mariah Milling, MD). He was last seen in the cardiology clinic on 08/29/2023; notes reviewed. At the time of his clinic visit, patient doing well overall from a cardiovascular perspective. Patient denied any chest pain, shortness of breath, PND, orthopnea, palpitations, significant peripheral edema, weakness, fatigue, vertiginous symptoms, or presyncope/syncope. Patient with a past medical history significant for cardiovascular diagnoses. Documented physical exam was grossly benign, providing no evidence of acute exacerbation and/or decompensation of the patient's known cardiovascular conditions.  Patient suffered an NSTEMI in May 2018.  Diagnostic LEFT heart catheterization on 01/06/2017 revealing a severely reduced left ventricular systolic function with an EF of 25-35%.  There was global hypokinesis.  LVEDP documented in the high 20s.  Coronary artery disease noted with minor luminal irregularities and the LAD, diagonal, LCx, and OM.  There was 40% stenosis noted of the proximal to distal RCA.  Given the nonobstructive nature of his coronary artery disease, the decision was made to defer intervention opting for medical management.  Patient with a history of a mixed ischemic and nonischemic cardiomyopathy resulting in HFrEF and pulmonary hypertension.  Cardiac function has been serially monitored via various modalities since time of his diagnosis.    Diagnostic RIGHT/LEFT heart catheterization was performed on 06/13/2020 revealing multivessel CAD; 50% distal LAD, 30% proximal to mid LCx, 40% OM1, and 100% proximal RCA.  RCA with left-to-right collateral formation.  Hemodynamics: LVEDP = 20 mmHg, mean RA = 9 mmHg, mean PA = 40 mmHg, mean PCWP = 17 mmHg, AO saturation = 98%, PA saturation = 61%.  CO = 3.1 L/min, CI = 1.3 L/min/m, and PVR 7.4 Wood units.  Patient underwent placement of a Interior and spatial designer MRI subcutaneous AICD on 05/20/2022.  Device is  regularly interrogated by patient's  primary electrophysiology team.  Most recent interrogation was on 08/26/2023, at which time device was noted to be functioning properly.  Most recent TTE was performed on 03/08/2023 revealing a mildly reduced left ventricular systolic function with an EF of 45-50%.  There was global hypokinesis.  Mild to moderate left ventricular cavity enlargement was noted.  Right ventricular size and function normal.  There was trivial mitral, and mild aortic valve regurgitation.  Aortic valve was sclerotic/calcified.  All transvalvular gradients were noted to be normal providing no evidence suggestive of valvular stenosis.  There was mild dilatation of the aortic root measuring 43 mm.  Repeat diagnostic RIGHT/LEFT heart catheterization was performed on 08/19/2023 revealing multivessel CAD; 50% distal LAD, 30% proximal-mid LCx, 40% OM1, chronic total occlusion of the proximal RCA with left-to-right collateral formation, and 50% proximal and mid RCA.  Coronary arteries relatively stable overall.  Given the chronic nature of the RCA occlusion, no intervention was deemed necessary.  Hemodynamics: LVEDP = 20 mmHg, mean RA = 5 mmHg, mean PA = 41 mmHg, mean PCWP = 18 mmHg, AO saturation, 92%, PA saturation = 59%.  CO = 4.3 L/min, CI = 1.9 L/min/m, and PVR 5.3 Wood units.  Patient on daily antithrombotic therapy using rivaroxaban.  He is reportedly compliant with therapy with no evidence or reports of GI/GU related bleeding.  Ischemic/nonischemic cardiomyopathy with resulting HFrEF managed using beta-blocker (carvedilol), nitrate (isosorbide mononitrate), and diuretic (torsemide + spironolactone), and ARB/ANRi (Entresto) therapies.  Blood pressure documented at 110/65 mmHg. In addition to his scheduled nitrate therapy, patient has a supply of short acting nitrates (NTG) to use on a as needed basis for recurrent angina/anginal equivalent symptoms; denied recent use. Patient is on fenofibrate +  rosuvastatin for his HLD diagnosis and ASCVD prevention. T2DM well controlled on currently prescribed regimen; last HgbA1c was 6.6% when checked on 07/11/2023.  In the setting of known cardiovascular diagnoses and concurrent T2DM, patient is on an SGLT2i (dapagliflozin) for added cardiovascular and renovascular protection. He does have an OSAH diagnosis and is reported to be compliant with prescribed nocturnal PAP therapy. Patient is able to complete all of his  ADL/IADLs without cardiovascular limitation.  Per the [facility]ASI, patient is able to achieve at least 4 METS of physical activity without experiencing any significant degree of angina/anginal equivalent symptoms. No changes were made to his medication regimen during his visit with cardiology. Patient scheduled to follow-up with outpatient cardiology in 4 months or sooner if needed.  Gabriel Mena Pauls. is scheduled for an elective SHOULDER ARTHROSCOPY WITH DEBRIDEMENT, DECOMPRESSION, ROTATOR CUFF REPAIR AND BICEPS TENODESIS. (Left: Shoulder) on 09/16/2023 with Dr. Leron Croak, MD.  Given patient's past medical history significant for cardiovascular diagnoses, presurgical cardiac clearance was sought by the PAT team. Per cardiology, "based ACC/AHA guidelines, the patient's past medical history, and the amount of time since his last clinic visit, this patient would be at an overall ACCEPTABLE risk for the planned procedure without further cardiovascular testing or intervention at this time".   Again, this patient is on daily oral anticoagulation therapy using a DOAC medication.  He has been instructed on recommendations for holding his rivaroxaban for 3 days prior to his procedure with plans to restart as soon as postoperative bleeding risk felt to be minimized by his attending surgeon. The patient has been instructed that his last dose of rivaroxaban should be on 09/12/2023. Given his significant cardiovascular history, cardiology recommending that patient  continue his daily low dose ASA throughout his perioperative  course.   Patient denies previous perioperative complications with anesthesia in the past. In review his EMR, it is noted that patient underwent a general anesthetic course here at [facility] ([facility]) in 09/2022 without documented complications.      08/29/2023    7:55 AM 08/19/2023   11:30 AM 08/19/2023   11:00 AM  Vitals with BMI  Height 5\' 11"     Weight 244 lbs 13 oz    BMI 34.16    Systolic 110 154 811  Diastolic 65 84 94  Pulse 76 83 85   Providers/Specialists:  NOTE: Primary physician provider listed below. Patient may have been seen by APP or partner within same practice.   PROVIDER ROLE / SPECIALTY LAST OV  Poggi, Excell Seltzer, MD Orthopedics (Surgeon) 09/09/2023  Sallee Provencal, FNP Primary Care Provider 07/31/2023  Julien Nordmann, MD Cardiology 08/29/2023; update call with preop APP on 09/05/2023   Allergies:   Allergies  Allergen Reactions   Metformin And Related Nausea And Vomiting   Zantac [Ranitidine Hcl] Diarrhea and Nausea Only    Night sweats   Current Home Medications:   No current facility-administered medications for this encounter.    albuterol (VENTOLIN HFA) 108 (90 Base) MCG/ACT inhaler   allopurinol (ZYLOPRIM) 300 MG tablet   aspirin EC 81 MG tablet   azelastine (ASTELIN) 0.1 % nasal spray   carvedilol (COREG) 6.25 MG tablet   cetirizine (ZYRTEC) 10 MG tablet   dapagliflozin propanediol (FARXIGA) 10 MG TABS tablet   fenofibrate (TRICOR) 145 MG tablet   FLUoxetine (PROZAC) 20 MG capsule   fluticasone (FLONASE) 50 MCG/ACT nasal spray   Insulin Glargine (BASAGLAR KWIKPEN) 100 UNIT/ML   insulin lispro (HUMALOG) 100 UNIT/ML injection   ipratropium-albuterol (DUONEB) 0.5-2.5 (3) MG/3ML SOLN   isosorbide mononitrate (IMDUR) 30 MG 24 hr tablet   meclizine (ANTIVERT) 25 MG tablet   metoCLOPramide (REGLAN) 5 MG tablet   naltrexone (DEPADE) 50 MG tablet    nitroGLYCERIN (NITROSTAT) 0.4 MG SL tablet   omeprazole (PRILOSEC) 40 MG capsule   potassium chloride SA (KLOR-CON M) 20 MEQ tablet   Resmetirom (REZDIFFRA) 100 MG TABS   rivaroxaban (XARELTO) 20 MG TABS tablet   rosuvastatin (CRESTOR) 20 MG tablet   sacubitril-valsartan (ENTRESTO) 97-103 MG   Semaglutide, 2 MG/DOSE, (OZEMPIC, 2 MG/DOSE,) 8 MG/3ML SOPN   TALTZ 80 MG/ML SOAJ   torsemide (DEMADEX) 20 MG tablet   umeclidinium-vilanterol (ANORO ELLIPTA) 62.5-25 MCG/ACT AEPB   Accu-Chek Softclix Lancets lancets   BD PEN NEEDLE NANO 2ND GEN 32G X 4 MM MISC   blood glucose meter kit and supplies KIT   colchicine 0.6 MG tablet   Continuous Glucose Sensor (FREESTYLE LIBRE 3 SENSOR) MISC   Continuous Glucose Sensor (FREESTYLE LIBRE 3 SENSOR) MISC   Insulin Pen Needle 32G X 4 MM MISC   spironolactone (ALDACTONE) 25 MG tablet   History:   Past Medical History:  Diagnosis Date   Aortic root dilation (HCC) 11/28/2016   a.) TTE 11/28/2016: 43 mm; b.) TTE 11/23/2019: 38 mm; c.) TTE 08/14/2022: 40 mm; d.) TTE 03/08/2023: 43 mm   Arteriosclerosis of right carotid artery    a.) carotid doppler 04/29/2017: <50% RICA   Bronchitis 07/2023   CAD (coronary artery disease)    a.) MV 04/2015: low risk; b.) LHC 01/06/2017: minor irregs in LAD/Diag/LCX/OM, RCA 40p/m/d; c.) R/LHC 06/13/2020: 50% dLAD, 30% p-mLCx, 40% OM1, 100% pRCA (L-R collats) - med mgmt; d.) R/LHC 08/19/2023: 50% dLAD,  30% p-mLCx, 40% OM1, 100% pRCA (L-R collats), 50% p-mRCA - med mgmt   Chest wall pain, chronic    Chronic 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    Chronic Troponin Elevation    CKD (chronic kidney disease), stage II-III    COPD (chronic obstructive pulmonary disease) (HCC)    Depression    DM (diabetes mellitus), type 2 (HCC)    HFrEF (heart failure with reduced ejection fraction) (HCC)    a.) EF 03/20/2015: EF 45-50%; b.) MV 05/19/2015: EF 30-44%; c.) TTE 01/04/2016: EF 20-25%; d.) TTE 02/29/2016: EF 30-35%; e.) TTE 11/28/2016:  EF 40-45%; f.) LHC 01/06/2017: EF 25-35%; g.) TTE 04/29/2017: EF 45-50%; h.) TTE 06/26/2019: EF 30-35%; i.) TTE 11/23/2019: EF 25-30%; j.) TTE 06/08/2020: EF 35-40%; k.) TTE 05/21/2021: EF 40-45%; l.) TTE 08/14/2022: EF 40-45%; m.) TTE 03/08/2023: EF 40-45%   History of 2019 novel coronavirus disease (COVID-19) 03/01/2022   History of methicillin resistant staphylococcus aureus (MRSA)    Hyperlipidemia    Hypertension    ICD (implantable cardioverter-defibrillator) in place 05/20/2022   a.) [employer] MRI S-ICD (SN: [phone])   Left rotator cuff tear    Long term current use of immunosuppressive drug    a.) ixekizumab for psoriasis/psoriatic arthritis diagnosis   Long-term use of aspirin therapy    Mediastinal lymphadenopathy    Mitral regurgitation    Mild to moderate by October 2021 echocardiogram.   Mixed Ischemic & NICM (nonischemic cardiomyopathy) (HCC)    NASH (nonalcoholic steatohepatitis)    NSTEMI (non-ST elevated myocardial infarction) (HCC) 12/2016   a.) LHC: 01/06/2017: minor luminal irregs LAD/diag/LCx/OM, 40% dRCA, 40% p-mRCA --> med mgmt   NSVT (nonsustained ventricular tachycardia) (HCC)    a. 12/2015 noted on tele-->amio;  b. 12/2015 Event monitor: no VT noted.   Obesity (BMI 30.0-34.9)    On rivaroxaban therapy    OSA treated with BiPAP    Polyp of descending colon    Psoriasis    Psoriatic arthritis (HCC)    a.) Tx'd with ixekizumab   Pulmonary HTN (HCC) 02/29/2016   a.) TTE 02/29/2016: RVSP 47; b.) TTE 06/26/2019: RVSP 58.4; c.) TTE 12/08/2019: RVSP 40.8; d.) TTE 06/08/2020: RVSP 44.7; e.) R/LHC 06/08/2020: mRA 9, mPA 40, mPWCP 17, AO sat 98, PA sat 61, CO 3.1, CI 1.3, PVR 7.4, LVEDP 20; f.) R/LHC 08/18/2020: mRA 5, mPA 41, mPCWP 18, AO sat 92, PA sat 59, CO 4.3, CI 1.9, PVR 5.3   Recurrent pulmonary emboli (HCC) 06/07/2020   06/07/20: small bilateral PEs.  12/31/19: RUL and RLL PEs.   Stroke [facility])    pt states per mri in 2023-unsure of when stroke  happened   Syncope    a. 01/2016 - felt to be vasovagal.   Thrombocytopenia (HCC)    Past Surgical History:  Procedure Laterality Date   COLONOSCOPY WITH PROPOFOL N/A 08/01/2022   Procedure: COLONOSCOPY WITH PROPOFOL;  Surgeon: Toney Reil, MD;  Location: [facility] ENDOSCOPY;  Service: Gastroenterology;  Laterality: N/A;   ESOPHAGOGASTRODUODENOSCOPY (EGD) WITH PROPOFOL N/A 08/01/2022   Procedure: ESOPHAGOGASTRODUODENOSCOPY (EGD) WITH PROPOFOL;  Surgeon: Toney Reil, MD;  Location: [facility] ENDOSCOPY;  Service: Gastroenterology;  Laterality: N/A;   ESOPHAGOGASTRODUODENOSCOPY (EGD) WITH PROPOFOL N/A 10/16/2022   Procedure: ESOPHAGOGASTRODUODENOSCOPY (EGD) WITH PROPOFOL;  Surgeon: Toney Reil, MD;  Location: [facility] ENDOSCOPY;  Service: Gastroenterology;  Laterality: N/A;   FINGER AMPUTATION Left    Traumatic   LEFT HEART CATH AND CORONARY ANGIOGRAPHY N/A 01/06/2017   Procedure: Left Heart Cath  and Coronary Angiography;  Surgeon: Iran Ouch, MD;  Location: [facility] INVASIVE CV LAB;  Service: Cardiovascular;  Laterality: N/A;   RIGHT/LEFT HEART CATH AND CORONARY ANGIOGRAPHY N/A 06/13/2020   Procedure: RIGHT/LEFT HEART CATH AND CORONARY ANGIOGRAPHY;  Surgeon: Yvonne Kendall, MD;  Location: [facility] INVASIVE CV LAB;  Service: Cardiovascular;  Laterality: N/A;   RIGHT/LEFT HEART CATH AND CORONARY ANGIOGRAPHY Bilateral 08/19/2023   Procedure: RIGHT/LEFT HEART CATH AND CORONARY ANGIOGRAPHY;  Surgeon: Yvonne Kendall, MD;  Location: [facility] INVASIVE CV LAB;  Service: Cardiovascular;  Laterality: Bilateral;   SUBQ ICD IMPLANT N/A 05/20/2022   Procedure: SUBQ ICD IMPLANT;  Surgeon: Lanier Prude, MD;  Location: [facility] INVASIVE CV LAB;  Service: Cardiovascular;  Laterality: N/A;   Family History  Problem Relation Age of Onset   Diabetes Mother    Diabetes Mellitus II Mother    Hypothyroidism Mother    Hypertension Mother    Kidney failure Mother        Dialysis   Heart attack Mother         54 yo approximately   Hypertension Father    Gout Father    Cancer Maternal Grandfather    Diabetes Maternal Grandfather    Cancer Paternal Aunt    Social History   Tobacco Use   Smoking status: Former    Current packs/day: 0.00    Average packs/day: 0.5 packs/day for 33.0 years (16.5 ttl pk-yrs)    Types: Cigarettes    Start date: 05/09/1987    Quit date: 05/08/2020    Years since quitting: 3.3   Smokeless tobacco: Former    Quit date: 05/08/2020   Tobacco comments:    Quit Sept 2021  Substance Use Topics   Alcohol use: Not Currently   Pertinent Clinical Results:  LABS:  Lab Results  Component Value Date   WBC 7.4 08/08/2023   HGB 15.6 08/19/2023   HCT 46.0 08/19/2023   MCV 83 08/08/2023   PLT 212 08/08/2023   Last metabolic panel Lab Results  Component Value Date   GLUCOSE 161 (H) 08/29/2023   NA 139 08/29/2023   K 4.0 08/29/2023   CL 99 08/29/2023   CO2 20 08/29/2023   BUN 27 (H) 08/29/2023   CREATININE 1.86 (H) 08/29/2023   EGFR 42 (L) 08/29/2023   CALCIUM 9.5 08/29/2023   PROT 7.6 06/16/2023   ALBUMIN 4.2 06/16/2023   LABGLOB 3.1 08/09/2022   AGRATIO 1.6 08/09/2022   BILITOT 1.5 (H) 06/16/2023   ALKPHOS 62 06/16/2023   AST 18 06/16/2023   ALT 23 06/16/2023   ANIONGAP 9 07/22/2023    ECG: Date: 08/29/2023 Time ECG obtained: 0802 AM Rate: 78 bpm Rhythm: normal sinus Axis (leads I and aVF): normal Intervals: PR 166 ms. QRS 102 ms. QTc 467 ms. ST segment and T wave changes: No evidence of acute T wave abnormalities or significant ST segment elevation or depression. Comparison: Similar to previous tracing obtained on 08/08/2023   IMAGING / PROCEDURES: CT CHEST HIGH RESOLUTION performed on 07/22/2023 No evidence of fibrotic interstitial lung disease. Mild congestive heart failure. Air trapping is indicative of small airways disease. Age advanced three-vessel coronary artery calcification. Punctate left renal stone. Aortic  atherosclerosis Enlarged pulmonic trunk, indicative of pulmonary arterial hypertension.  RIGHT/LEFT HEART CATHETERIZATION AND CORONARY ANGIOGRAPHY performed on 08/19/2023 Relatively stable appearance of the coronary arteries since last catheterization in 2021.  Chronically occluded proximal RCA now has antegrade flow, though I suspect this represents bridging collaterals.  Left-to-right collateral remain  as well as moderate LAD and LCx disease. Mildly elevated left heart filling pressures (LVEDP 20 mmHg, PCWP 18 mmHg). Moderate pulmonary hypertension (mean PA 41 mmHg, PVR 5.3 WU). Moderately reduced Fick cardiac output/index (CO 4.3 L/min, CI 1.9 L/min/m^2).  MRI LEFT SHOULDER WITHOUT CONTRAST performed on 06/25/2023 Rotator cuff tendinosis with high-grade supraspinatus tear Mild cartilage changes of osteoarthritis.  Mild inflammation about the joint.   TRANSTHORACIC ECHOCARDIOGRAM performed on 03/08/2023 Left ventricular ejection fraction, by estimation, is 45 to 50%. The left ventricle has mildly decreased function. The left ventricle demonstrates global hypokinesis. The left ventricular internal cavity size was mildly to moderately dilated. There is moderate concentric left ventricular hypertrophy. Left ventricular diastolic function could not be evaluated.  Right ventricular systolic function is normal. The right ventricular size is normal. Tricuspid regurgitation signal is inadequate for assessing PA pressure.  The mitral valve is degenerative. Trivial mitral valve regurgitation. No evidence of mitral stenosis.  The aortic valve is calcified. Aortic valve regurgitation is mild. Aortic valve sclerosis/calcification is present, without any evidence of aortic stenosis.  Aortic dilatation noted. There is mild dilatation of the aortic root, measuring 43 mm.   PULMONARY FUNCTION TESTING performed on 10/17/2022    Latest Ref Rng & Units 10/17/2022    8:14 AM  PFT Results  FVC-Pre L 4.38    FVC-Predicted Pre % 85   FVC-Post L 4.50   FVC-Predicted Post % 87   Pre FEV1/FVC % % 83   Post FEV1/FCV % % 85   FEV1-Pre L 3.61   FEV1-Predicted Pre % 91   FEV1-Post L 3.83   DLCO uncorrected ml/min/mmHg 20.99   DLCO UNC% % 70   DLVA Predicted % 69   TLC L 6.58   TLC % Predicted % 91   RV % Predicted % 87     Impression and Plan:  Gabriel [name]. has been referred for pre-anesthesia review and clearance prior to him undergoing the planned anesthetic and procedural courses. Available labs, pertinent testing, and imaging results were personally reviewed by me in preparation for upcoming operative/procedural course. [facility] medical record has been updated following extensive record review and patient interview with PAT staff.   This patient has been appropriately cleared by cardiology with an overall ACCEPTABLE risk of experiencing significant perioperative cardiovascular complications.Completed perioperative prescription for cardiac device management documentation completed by primary cardiology team and placed on patient's chart for review by the surgical/anesthetic team on the day of his procedure. Electrophysiology indicating that procedure will likely interfere with planned surgical procedure.  Recommendations are for device to be programmed with tachy arrhythmias off.  This is generally accomplished with the placement of a magnet, however anesthesia is advising that magnet will be in the way of surgical site, and is therefore asking for industry representative to present to campus to reprogram device. SDS nursing director Lionel December, RN) to contact device representative and advise them of case details and need to be present on day of patient's procedure.   Based on clinical review performed today (09/12/23), barring any significant acute changes in the patient's overall condition, it is anticipated that he will be able to proceed with the planned surgical intervention. Any acute  changes in clinical condition may necessitate his procedure being postponed and/or cancelled. Patient will meet with anesthesia team (MD and/or CRNA) on the day of his procedure for preoperative evaluation/assessment. Questions regarding anesthetic course will be fielded at that time.   Pre-surgical instructions were reviewed with the patient  during his PAT appointment, and questions were fielded to satisfaction by PAT clinical staff. He has been instructed on which medications that he will need to hold prior to surgery, as well as the ones that have been deemed safe/appropriate to take on the day of his procedure. As part of the general education provided by PAT, patient made aware both verbally and in writing, that he would need to abstain from the use of any illegal substances during his perioperative course. He was advised that failure to follow the provided instructions could necessitate case cancellation or result in serious perioperative complications up to and including death. Patient encouraged to contact PAT and/or his surgeon's office to discuss any questions or concerns that may arise prior to surgery; verbalized understanding.   Quentin Mulling, MSN, APRN, FNP-C, CEN [facility]  Perioperative Services Nurse Practitioner Phone: [phone] Fax: [phone] 09/12/23 7:12 PM  NOTE: This note has been prepared using Dragon dictation software. Despite my best ability to proofread, there is always the potential that unintentional transcriptional errors may still occur from this process.

## 2023-09-14 ENCOUNTER — Encounter: Payer: Self-pay | Admitting: Surgery

## 2023-09-15 ENCOUNTER — Encounter: Payer: Self-pay | Admitting: Cardiology

## 2023-09-15 MED ORDER — CEFAZOLIN SODIUM-DEXTROSE 2-4 GM/100ML-% IV SOLN
2.0000 g | INTRAVENOUS | Status: AC
  Administered 2023-09-16: 2 g via INTRAVENOUS

## 2023-09-15 MED ORDER — SODIUM CHLORIDE 0.9 % IV SOLN: INTRAVENOUS | Status: DC

## 2023-09-15 MED ORDER — ORAL CARE MOUTH RINSE
15.0000 mL | Freq: Once | OROMUCOSAL | Status: AC
Start: 2023-09-15 — End: 2023-09-16

## 2023-09-15 MED ORDER — CHLORHEXIDINE GLUCONATE 0.12 % MT SOLN
15.0000 mL | Freq: Once | OROMUCOSAL | Status: AC
Start: 2023-09-15 — End: 2023-09-16
  Administered 2023-09-16: 15 mL via OROMUCOSAL

## 2023-09-15 NOTE — Progress Notes (Signed)
PERIOPERATIVE PRESCRIPTION FOR IMPLANTED CARDIAC DEVICE PROGRAMMING  Patient Information: Name:  Gabriel [name].  DOB:  [birth year]  MRN:  [id number]    Planned Procedure: LEFT SHOULDER ARTHROSCOPY WITH DEBRIDEMENT, DECOMPRESSION, ROTATOR CUFF REPAIR AND BICEPS TENODESIS.    Surgeon:  Dr. Leron Croak, MD  Requesting device clearance: Quentin Mulling, FNP-C  Date of Procedure:  09/16/2023  Cautery will be used.   Please route documentation back me via [facility], or may fax report to [facility] PAT APP at [phone].  Device Information:  Clinic EP Physician:  Dr. Steffanie Dunn   Device Type:  Defibrillator Manufacturer and Phone #:  [employer]: [phone] Pacemaker Dependent?:  No. Date of Last Device Check:  08/26/2023 Normal Device Function?:  Yes.    Electrophysiologist's Recommendations:  Have magnet available. Provide continuous ECG monitoring when magnet is used or reprogramming is to be performed.  Procedure will likely interfere with device function.  Device should be programmed:  Tachy therapies disabled  Per Device Clinic Standing Orders, Lenor Coffin, RN  10:43 AM 09/15/2023

## 2023-09-16 ENCOUNTER — Ambulatory Visit: Payer: Medicaid Other

## 2023-09-16 ENCOUNTER — Other Ambulatory Visit: Payer: Self-pay

## 2023-09-16 ENCOUNTER — Ambulatory Visit: Payer: Self-pay | Admitting: Urgent Care

## 2023-09-16 ENCOUNTER — Ambulatory Visit
Admission: RE | Admit: 2023-09-16 | Discharge: 2023-09-16 | Disposition: A | Discharge status: Home / Self Care | Payer: Medicaid Other | Source: Ambulatory Visit | Attending: Surgery | Admitting: Surgery

## 2023-09-16 ENCOUNTER — Ambulatory Visit: Payer: Medicaid Other | Admitting: Oncology

## 2023-09-16 ENCOUNTER — Encounter
Admission: RE | Disposition: A | Discharge status: Home / Self Care | Payer: Self-pay | Source: Ambulatory Visit | Attending: Surgery

## 2023-09-16 ENCOUNTER — Other Ambulatory Visit: Payer: Medicaid Other

## 2023-09-16 ENCOUNTER — Ambulatory Visit: Payer: Medicaid Other | Admitting: Urgent Care

## 2023-09-16 ENCOUNTER — Encounter: Payer: Self-pay | Admitting: Surgery

## 2023-09-16 DIAGNOSIS — M7582 Other shoulder lesions, left shoulder: Secondary | ICD-10-CM | POA: Diagnosis not present

## 2023-09-16 DIAGNOSIS — L4050 Arthropathic psoriasis, unspecified: Secondary | ICD-10-CM | POA: Insufficient documentation

## 2023-09-16 DIAGNOSIS — Z7982 Long term (current) use of aspirin: Secondary | ICD-10-CM

## 2023-09-16 DIAGNOSIS — Z86711 Personal history of pulmonary embolism: Secondary | ICD-10-CM | POA: Diagnosis not present

## 2023-09-16 DIAGNOSIS — M7542 Impingement syndrome of left shoulder: Secondary | ICD-10-CM | POA: Diagnosis not present

## 2023-09-16 DIAGNOSIS — E66811 Obesity, class 1: Secondary | ICD-10-CM

## 2023-09-16 DIAGNOSIS — Z01818 Encounter for other preprocedural examination: Secondary | ICD-10-CM

## 2023-09-16 DIAGNOSIS — M25812 Other specified joint disorders, left shoulder: Secondary | ICD-10-CM | POA: Diagnosis present

## 2023-09-16 DIAGNOSIS — Z7984 Long term (current) use of oral hypoglycemic drugs: Secondary | ICD-10-CM | POA: Diagnosis not present

## 2023-09-16 DIAGNOSIS — E785 Hyperlipidemia, unspecified: Secondary | ICD-10-CM | POA: Insufficient documentation

## 2023-09-16 DIAGNOSIS — N183 Chronic kidney disease, stage 3 unspecified: Secondary | ICD-10-CM | POA: Insufficient documentation

## 2023-09-16 DIAGNOSIS — M19012 Primary osteoarthritis, left shoulder: Secondary | ICD-10-CM | POA: Diagnosis not present

## 2023-09-16 DIAGNOSIS — M24112 Other articular cartilage disorders, left shoulder: Secondary | ICD-10-CM | POA: Insufficient documentation

## 2023-09-16 DIAGNOSIS — Z79899 Other long term (current) drug therapy: Secondary | ICD-10-CM | POA: Insufficient documentation

## 2023-09-16 DIAGNOSIS — Z794 Long term (current) use of insulin: Secondary | ICD-10-CM | POA: Insufficient documentation

## 2023-09-16 DIAGNOSIS — Z7901 Long term (current) use of anticoagulants: Secondary | ICD-10-CM | POA: Diagnosis not present

## 2023-09-16 DIAGNOSIS — Z8673 Personal history of transient ischemic attack (TIA), and cerebral infarction without residual deficits: Secondary | ICD-10-CM | POA: Insufficient documentation

## 2023-09-16 DIAGNOSIS — Z87891 Personal history of nicotine dependence: Secondary | ICD-10-CM | POA: Insufficient documentation

## 2023-09-16 DIAGNOSIS — I130 Hypertensive heart and chronic kidney disease with heart failure and stage 1 through stage 4 chronic kidney disease, or unspecified chronic kidney disease: Secondary | ICD-10-CM | POA: Diagnosis not present

## 2023-09-16 DIAGNOSIS — I252 Old myocardial infarction: Secondary | ICD-10-CM | POA: Diagnosis not present

## 2023-09-16 DIAGNOSIS — E1122 Type 2 diabetes mellitus with diabetic chronic kidney disease: Secondary | ICD-10-CM | POA: Insufficient documentation

## 2023-09-16 DIAGNOSIS — M1A39X0 Chronic gout due to renal impairment, multiple sites, without tophus (tophi): Secondary | ICD-10-CM

## 2023-09-16 DIAGNOSIS — M7522 Bicipital tendinitis, left shoulder: Secondary | ICD-10-CM | POA: Diagnosis not present

## 2023-09-16 DIAGNOSIS — G4733 Obstructive sleep apnea (adult) (pediatric): Secondary | ICD-10-CM | POA: Insufficient documentation

## 2023-09-16 DIAGNOSIS — F32A Depression, unspecified: Secondary | ICD-10-CM | POA: Insufficient documentation

## 2023-09-16 DIAGNOSIS — F339 Major depressive disorder, recurrent, unspecified: Secondary | ICD-10-CM

## 2023-09-16 DIAGNOSIS — I272 Pulmonary hypertension, unspecified: Secondary | ICD-10-CM | POA: Insufficient documentation

## 2023-09-16 DIAGNOSIS — R0602 Shortness of breath: Secondary | ICD-10-CM

## 2023-09-16 DIAGNOSIS — I5022 Chronic systolic (congestive) heart failure: Secondary | ICD-10-CM | POA: Diagnosis not present

## 2023-09-16 DIAGNOSIS — N1832 Chronic kidney disease, stage 3b: Secondary | ICD-10-CM | POA: Diagnosis not present

## 2023-09-16 DIAGNOSIS — E782 Mixed hyperlipidemia: Secondary | ICD-10-CM

## 2023-09-16 DIAGNOSIS — Z7985 Long-term (current) use of injectable non-insulin antidiabetic drugs: Secondary | ICD-10-CM | POA: Diagnosis not present

## 2023-09-16 DIAGNOSIS — M75112 Incomplete rotator cuff tear or rupture of left shoulder, not specified as traumatic: Secondary | ICD-10-CM | POA: Diagnosis not present

## 2023-09-16 DIAGNOSIS — J449 Chronic obstructive pulmonary disease, unspecified: Secondary | ICD-10-CM | POA: Insufficient documentation

## 2023-09-16 DIAGNOSIS — I2510 Atherosclerotic heart disease of native coronary artery without angina pectoris: Secondary | ICD-10-CM | POA: Diagnosis not present

## 2023-09-16 HISTORY — DX: Other long term (current) drug therapy: Z79.899

## 2023-09-16 HISTORY — DX: Nonalcoholic steatohepatitis (NASH): K75.81

## 2023-09-16 HISTORY — DX: Long term (current) use of unspecified immunomodulators and immunosuppressants: Z79.60

## 2023-09-16 HISTORY — DX: Occlusion and stenosis of right carotid artery: I65.21

## 2023-09-16 HISTORY — PX: SHOULDER ARTHROSCOPY WITH SUBACROMIAL DECOMPRESSION, ROTATOR CUFF REPAIR AND BICEP TENDON REPAIR: SHX5687

## 2023-09-16 HISTORY — DX: Long term (current) use of anticoagulants: Z79.01

## 2023-09-16 HISTORY — DX: Long term (current) use of aspirin: Z79.82

## 2023-09-16 HISTORY — DX: Unspecified rotator cuff tear or rupture of left shoulder, not specified as traumatic: M75.102

## 2023-09-16 LAB — GLUCOSE, CAPILLARY
Glucose-Capillary: 211 mg/dL — ABNORMAL HIGH (ref 70–99)
Glucose-Capillary: 203 mg/dL — ABNORMAL HIGH (ref 70–99)

## 2023-09-16 SURGERY — SHOULDER ARTHROSCOPY WITH SUBACROMIAL DECOMPRESSION, ROTATOR CUFF REPAIR AND BICEP TENDON REPAIR
Anesthesia: General | Site: Shoulder | Laterality: Left

## 2023-09-16 MED ORDER — FENTANYL CITRATE PF 50 MCG/ML IJ SOSY
50.0000 ug | PREFILLED_SYRINGE | Freq: Once | INTRAMUSCULAR | Status: AC
  Administered 2023-09-16: 50 ug via INTRAVENOUS

## 2023-09-16 MED ORDER — FENOFIBRATE 145 MG PO TABS: 145.0000 mg | ORAL_TABLET | Freq: Every evening | ORAL | Status: DC

## 2023-09-16 MED ORDER — PROPOFOL 10 MG/ML IV BOLUS
INTRAVENOUS | Status: AC
  Filled 2023-09-16: qty 20

## 2023-09-16 MED ORDER — TORSEMIDE 20 MG PO TABS: 20.0000 mg | ORAL_TABLET | ORAL | Status: DC

## 2023-09-16 MED ORDER — ONDANSETRON HCL 4 MG/2ML IJ SOLN
INTRAMUSCULAR | Status: DC | PRN
  Administered 2023-09-16: 4 mg via INTRAVENOUS

## 2023-09-16 MED ORDER — ROSUVASTATIN CALCIUM 20 MG PO TABS: 20.0000 mg | ORAL_TABLET | Freq: Every evening | ORAL | Status: DC

## 2023-09-16 MED ORDER — OMEPRAZOLE 40 MG PO CPDR: 40.0000 mg | DELAYED_RELEASE_CAPSULE | ORAL | Status: DC

## 2023-09-16 MED ORDER — NALTREXONE HCL 50 MG PO TABS: 50.0000 mg | ORAL_TABLET | ORAL | Status: DC

## 2023-09-16 MED ORDER — MIDAZOLAM HCL 2 MG/2ML IJ SOLN
1.0000 mg | INTRAMUSCULAR | Status: DC | PRN
  Administered 2023-09-16: 1 mg via INTRAVENOUS

## 2023-09-16 MED ORDER — DEXAMETHASONE SODIUM PHOSPHATE 10 MG/ML IJ SOLN
INTRAMUSCULAR | Status: DC | PRN
  Administered 2023-09-16: 5 mg via INTRAVENOUS

## 2023-09-16 MED ORDER — CETIRIZINE HCL 10 MG PO TABS: 10.0000 mg | ORAL_TABLET | ORAL | Status: DC

## 2023-09-16 MED ORDER — LACTATED RINGERS IV SOLN: INTRAVENOUS | Status: DC | PRN

## 2023-09-16 MED ORDER — BUPIVACAINE LIPOSOME 1.3 % IJ SUSP
INTRAMUSCULAR | Status: DC | PRN
  Administered 2023-09-16: 7 mL via PERINEURAL
  Administered 2023-09-16: 13 mL via PERINEURAL

## 2023-09-16 MED ORDER — OXYCODONE HCL 5 MG PO TABS
5.0000 mg | ORAL_TABLET | Freq: Once | ORAL | Status: AC | PRN
  Administered 2023-09-16: 5 mg via ORAL

## 2023-09-16 MED ORDER — DEXAMETHASONE SODIUM PHOSPHATE 10 MG/ML IJ SOLN
INTRAMUSCULAR | Status: AC
  Filled 2023-09-16: qty 1

## 2023-09-16 MED ORDER — MECLIZINE HCL 25 MG PO TABS: 12.5000 mg | ORAL_TABLET | Freq: Three times a day (TID) | ORAL | Status: AC | PRN

## 2023-09-16 MED ORDER — LIDOCAINE HCL (PF) 2 % IJ SOLN
INTRAMUSCULAR | Status: AC
  Filled 2023-09-16: qty 5

## 2023-09-16 MED ORDER — LIDOCAINE HCL (PF) 1 % IJ SOLN
INTRAMUSCULAR | Status: AC
  Filled 2023-09-16: qty 5

## 2023-09-16 MED ORDER — EPHEDRINE 5 MG/ML INJ
INTRAVENOUS | Status: AC
  Filled 2023-09-16: qty 5

## 2023-09-16 MED ORDER — CHLORHEXIDINE GLUCONATE 0.12 % MT SOLN
OROMUCOSAL | Status: AC
  Filled 2023-09-16: qty 15

## 2023-09-16 MED ORDER — OXYCODONE HCL 5 MG PO TABS
ORAL_TABLET | ORAL | Status: AC
  Filled 2023-09-16: qty 1

## 2023-09-16 MED ORDER — OZEMPIC (2 MG/DOSE) 8 MG/3ML ~~LOC~~ SOPN: 2.0000 mg | PEN_INJECTOR | SUBCUTANEOUS | Status: DC

## 2023-09-16 MED ORDER — FENTANYL CITRATE (PF) 100 MCG/2ML IJ SOLN
INTRAMUSCULAR | Status: DC | PRN
  Administered 2023-09-16 (×2): 50 ug via INTRAVENOUS

## 2023-09-16 MED ORDER — KETOROLAC TROMETHAMINE 30 MG/ML IJ SOLN
INTRAMUSCULAR | Status: AC
  Filled 2023-09-16: qty 1

## 2023-09-16 MED ORDER — RINGERS IRRIGATION IR SOLN
Status: DC | PRN
  Administered 2023-09-16: 1

## 2023-09-16 MED ORDER — INSULIN ASPART 100 UNIT/ML IJ SOLN
INTRAMUSCULAR | Status: AC
  Filled 2023-09-16: qty 1

## 2023-09-16 MED ORDER — KETOROLAC TROMETHAMINE 30 MG/ML IJ SOLN
30.0000 mg | Freq: Once | INTRAMUSCULAR | Status: AC
  Administered 2023-09-16: 30 mg via INTRAVENOUS

## 2023-09-16 MED ORDER — BUPIVACAINE LIPOSOME 1.3 % IJ SUSP
INTRAMUSCULAR | Status: AC
  Filled 2023-09-16: qty 20

## 2023-09-16 MED ORDER — LIDOCAINE HCL (CARDIAC) PF 100 MG/5ML IV SOSY
PREFILLED_SYRINGE | INTRAVENOUS | Status: DC | PRN
  Administered 2023-09-16: 40 mg via INTRAVENOUS

## 2023-09-16 MED ORDER — FENTANYL CITRATE (PF) 100 MCG/2ML IJ SOLN
25.0000 ug | INTRAMUSCULAR | Status: DC | PRN
  Administered 2023-09-16: 25 ug via INTRAVENOUS

## 2023-09-16 MED ORDER — OXYCODONE HCL 5 MG/5ML PO SOLN: 5.0000 mg | Freq: Once | ORAL | Status: AC | PRN

## 2023-09-16 MED ORDER — EPINEPHRINE PF 1 MG/ML IJ SOLN
INTRAMUSCULAR | Status: AC
  Filled 2023-09-16: qty 1

## 2023-09-16 MED ORDER — INSULIN ASPART 100 UNIT/ML IJ SOLN
5.0000 [IU] | Freq: Once | INTRAMUSCULAR | Status: AC
  Administered 2023-09-16: 5 [IU] via SUBCUTANEOUS

## 2023-09-16 MED ORDER — PHENYLEPHRINE 80 MCG/ML (10ML) SYRINGE FOR IV PUSH (FOR BLOOD PRESSURE SUPPORT)
PREFILLED_SYRINGE | INTRAVENOUS | Status: AC
  Filled 2023-09-16: qty 10

## 2023-09-16 MED ORDER — ANORO ELLIPTA 62.5-25 MCG/ACT IN AEPB: 1.0000 | INHALATION_SPRAY | RESPIRATORY_TRACT | Status: DC

## 2023-09-16 MED ORDER — MIDAZOLAM HCL 2 MG/2ML IJ SOLN
INTRAMUSCULAR | Status: AC
Start: 2023-09-16 — End: ?
  Filled 2023-09-16: qty 2

## 2023-09-16 MED ORDER — COLCHICINE 0.6 MG PO TABS: 0.6000 mg | ORAL_TABLET | Freq: Every day | ORAL | Status: DC | PRN

## 2023-09-16 MED ORDER — CARVEDILOL 6.25 MG PO TABS: 6.2500 mg | ORAL_TABLET | Freq: Two times a day (BID) | ORAL | Status: DC

## 2023-09-16 MED ORDER — FENTANYL CITRATE (PF) 100 MCG/2ML IJ SOLN
INTRAMUSCULAR | Status: AC
  Filled 2023-09-16: qty 2

## 2023-09-16 MED ORDER — ENTRESTO 97-103 MG PO TABS: 1.0000 | ORAL_TABLET | Freq: Two times a day (BID) | ORAL | Status: DC

## 2023-09-16 MED ORDER — REZDIFFRA 100 MG PO TABS: 1.0000 | ORAL_TABLET | Freq: Every evening | ORAL | Status: DC

## 2023-09-16 MED ORDER — EPHEDRINE SULFATE-NACL 50-0.9 MG/10ML-% IV SOSY
PREFILLED_SYRINGE | INTRAVENOUS | Status: DC | PRN
  Administered 2023-09-16 (×4): 5 mg via INTRAVENOUS

## 2023-09-16 MED ORDER — BUPIVACAINE-EPINEPHRINE (PF) 0.5% -1:200000 IJ SOLN
INTRAMUSCULAR | Status: AC
  Filled 2023-09-16: qty 10

## 2023-09-16 MED ORDER — POTASSIUM CHLORIDE CRYS ER 20 MEQ PO TBCR: 20.0000 meq | EXTENDED_RELEASE_TABLET | ORAL | Status: DC

## 2023-09-16 MED ORDER — BUPIVACAINE HCL (PF) 0.5 % IJ SOLN
INTRAMUSCULAR | Status: AC
  Filled 2023-09-16: qty 10

## 2023-09-16 MED ORDER — ONDANSETRON HCL 4 MG/2ML IJ SOLN
INTRAMUSCULAR | Status: AC
  Filled 2023-09-16: qty 2

## 2023-09-16 MED ORDER — ROCURONIUM BROMIDE 100 MG/10ML IV SOLN
INTRAVENOUS | Status: DC | PRN
  Administered 2023-09-16: 50 mg via INTRAVENOUS
  Administered 2023-09-16: 30 mg via INTRAVENOUS

## 2023-09-16 MED ORDER — PHENYLEPHRINE 80 MCG/ML (10ML) SYRINGE FOR IV PUSH (FOR BLOOD PRESSURE SUPPORT)
PREFILLED_SYRINGE | INTRAVENOUS | Status: DC | PRN
  Administered 2023-09-16 (×2): 80 ug via INTRAVENOUS
  Administered 2023-09-16: 160 ug via INTRAVENOUS
  Administered 2023-09-16: 80 ug via INTRAVENOUS
  Administered 2023-09-16: 160 ug via INTRAVENOUS

## 2023-09-16 MED ORDER — BUPIVACAINE HCL (PF) 0.5 % IJ SOLN
INTRAMUSCULAR | Status: DC | PRN
  Administered 2023-09-16: 7 mL via PERINEURAL
  Administered 2023-09-16: 3 mL via PERINEURAL

## 2023-09-16 MED ORDER — FENTANYL CITRATE PF 50 MCG/ML IJ SOSY
PREFILLED_SYRINGE | INTRAMUSCULAR | Status: AC
  Filled 2023-09-16: qty 1

## 2023-09-16 MED ORDER — FLUOXETINE HCL 20 MG PO CAPS: 20.0000 mg | ORAL_CAPSULE | Freq: Every evening | ORAL | Status: DC

## 2023-09-16 MED ORDER — SUGAMMADEX SODIUM 200 MG/2ML IV SOLN
INTRAVENOUS | Status: DC | PRN
  Administered 2023-09-16: 200 mg via INTRAVENOUS

## 2023-09-16 MED ORDER — ISOSORBIDE MONONITRATE ER 30 MG PO TB24: 15.0000 mg | ORAL_TABLET | ORAL | Status: DC

## 2023-09-16 MED ORDER — OXYCODONE HCL 5 MG PO TABS: 5.0000 mg | ORAL_TABLET | ORAL | Status: DC | PRN

## 2023-09-16 MED ORDER — FLUTICASONE PROPIONATE 50 MCG/ACT NA SUSP: 2.0000 | Freq: Every day | NASAL | Status: DC | PRN

## 2023-09-16 MED ORDER — PROPOFOL 10 MG/ML IV BOLUS
INTRAVENOUS | Status: DC | PRN
  Administered 2023-09-16: 200 mg via INTRAVENOUS

## 2023-09-16 MED ORDER — OXYCODONE HCL 5 MG PO TABS: 5.0000 mg | ORAL_TABLET | ORAL | 0 refills | Status: DC | PRN

## 2023-09-16 MED ORDER — CEFAZOLIN SODIUM-DEXTROSE 2-4 GM/100ML-% IV SOLN
INTRAVENOUS | Status: AC
  Filled 2023-09-16: qty 100

## 2023-09-16 MED ORDER — BUPIVACAINE-EPINEPHRINE 0.5% -1:200000 IJ SOLN
INTRAMUSCULAR | Status: DC | PRN
  Administered 2023-09-16: 30 mL

## 2023-09-16 SURGICAL SUPPLY — 45 items
ANCHOR HEALICOIL REGEN 5.5 (Anchor) IMPLANT
ANCHOR JUGGERKNOT WTAP NDL 2.9 (Anchor) IMPLANT
ANCHOR QFIX 2.8 SUT MINI TAPE (Anchor) IMPLANT
BIT DRILL JUGRKNT W/NDL BIT2.9 (DRILL) IMPLANT
BLADE FULL RADIUS 3.5 (BLADE) ×2 IMPLANT
BUR ACROMIONIZER 4.0 (BURR) ×2 IMPLANT
CHLORAPREP W/TINT 26 (MISCELLANEOUS) ×2 IMPLANT
COVER MAYO STAND STRL (DRAPES) ×2 IMPLANT
DILATOR 5.5 THREADED HEALICOIL (MISCELLANEOUS) IMPLANT
DRILL JUGGERKNOT W/NDL BIT 2.9 (DRILL) ×1 IMPLANT
ELECT CAUTERY BLADE 6.4 (BLADE) ×2 IMPLANT
ELECT REM PT RETURN 9FT ADLT (ELECTROSURGICAL) ×1 IMPLANT
ELECTRODE REM PT RTRN 9FT ADLT (ELECTROSURGICAL) ×2 IMPLANT
GAUZE SPONGE 4X4 12PLY STRL (GAUZE/BANDAGES/DRESSINGS) ×2 IMPLANT
GAUZE XEROFORM 1X8 LF (GAUZE/BANDAGES/DRESSINGS) ×2 IMPLANT
GLOVE BIO SURGEON STRL SZ7.5 (GLOVE) ×4 IMPLANT
GLOVE BIO SURGEON STRL SZ8 (GLOVE) ×4 IMPLANT
GLOVE BIOGEL PI IND STRL 8 (GLOVE) ×2 IMPLANT
GLOVE INDICATOR 8.0 STRL GRN (GLOVE) ×2 IMPLANT
GOWN STRL REUS W/ TWL LRG LVL3 (GOWN DISPOSABLE) ×2 IMPLANT
GOWN STRL REUS W/ TWL XL LVL3 (GOWN DISPOSABLE) ×2 IMPLANT
GRASPER SUT 15 45D LOW PRO (SUTURE) IMPLANT
IV LR IRRIG 3000ML ARTHROMATIC (IV SOLUTION) ×4 IMPLANT
KIT CANNULA 8X76-LX IN CANNULA (CANNULA) ×2 IMPLANT
KIT SUTURE 2.8 Q-FIX DISP (MISCELLANEOUS) IMPLANT
MANIFOLD NEPTUNE II (INSTRUMENTS) ×2 IMPLANT
MASK FACE SPIDER DISP (MASK) ×2 IMPLANT
MAT ABSORB FLUID 56X50 GRAY (MISCELLANEOUS) ×2 IMPLANT
PACK ARTHROSCOPY SHOULDER (MISCELLANEOUS) ×2 IMPLANT
PAD ABD DERMACEA PRESS 5X9 (GAUZE/BANDAGES/DRESSINGS) ×4 IMPLANT
PASSER SUT FIRSTPASS SELF (INSTRUMENTS) IMPLANT
SLING ARM LRG DEEP (SOFTGOODS) ×2 IMPLANT
SLING ULTRA II LG (MISCELLANEOUS) ×2 IMPLANT
SPONGE T-LAP 18X18 ~~LOC~~+RFID (SPONGE) ×2 IMPLANT
STAPLER SKIN PROX 35W (STAPLE) ×2 IMPLANT
STRAP SAFETY 5IN WIDE (MISCELLANEOUS) ×2 IMPLANT
SUT ETHIBOND 0 MO6 C/R (SUTURE) ×2 IMPLANT
SUT ULTRABRAID 2 COBRAID 38 (SUTURE) IMPLANT
SUT VIC AB 2-0 CT1 TAPERPNT 27 (SUTURE) ×4 IMPLANT
TAPE MICROFOAM 4IN (TAPE) ×2 IMPLANT
TRAP FLUID SMOKE EVACUATOR (MISCELLANEOUS) ×2 IMPLANT
TUBE SET DOUBLEFLO INFLOW (TUBING) ×2 IMPLANT
TUBING CONNECTING 10 (TUBING) ×2 IMPLANT
WAND WEREWOLF FLOW 90D (MISCELLANEOUS) ×2 IMPLANT
WATER STERILE IRR 500ML POUR (IV SOLUTION) ×2 IMPLANT

## 2023-09-16 NOTE — Op Note (Signed)
09/16/2023  12:32 PM  Patient:   Gabriel [name].  Pre-Op Diagnosis:   Impingement/tendinopathy with partial-thickness rotator cuff tear, left shoulder.  Post-Op Diagnosis:   Impingement/tendinopathy with partial-thickness rotator cuff tear, extensive degenerative labral fraying, and biceps tendinopathy, left shoulder.  Procedure:   Limited arthroscopic debridement, arthroscopic subacromial decompression, mini-open rotator cuff repair, and mini-open biceps tenodesis, left shoulder.  Anesthesia:   General endotracheal with interscalene block using Exparel placed preoperatively by the anesthesiologist.  Surgeon:   Maryagnes Amos, MD  Assistant:   Horris Latino, PA-C  Findings:   As above. There was extensive labral fraying involving the anterior, superior, and posterior superior portions of the labrum without frank detachment from the glenoid rim. There was a near full-thickness articular sided partial-thickness tear involving the mid and posterior insertional fibers of the supraspinatus tendon. The remainder of the rotator cuff was in satisfactory condition. The biceps tendon demonstrated moderate "lip sticking" without partial or full-thickness tearing. The articular surfaces of the glenoid and humerus both were in satisfactory condition.  Complications:   None  Fluids:   500 cc  Estimated blood loss:   10 cc  Tourniquet time:   None  Drains:   None  Closure:   Staples      Brief clinical note:   The patient is a 53 year old male with a history of progressively worsening left shoulder pain and weakness. His symptoms have progressed despite medications, activity modification, etc. The patient's history and examination are consistent with impingement/tendinopathy with a rotator cuff tear. These findings were confirmed by MRI scan. The patient presents at this time for definitive management of these shoulder symptoms.  Procedure:   The patient underwent placement of an interscalene  block using Exparel by the anesthesiologist in the preoperative holding area before being brought into the operating room and lain in the supine position. The patient then underwent general endotracheal intubation and anesthesia before being repositioned in the beach chair position using the beach chair positioner. The left shoulder and upper extremity were prepped with ChloraPrep solution before being draped sterilely. Preoperative antibiotics were administered. A timeout was performed to confirm the proper surgical site before the expected portal sites and incision site were injected with 0.5% Sensorcaine with epinephrine.   A posterior portal was created and the glenohumeral joint thoroughly inspected with the findings as described above. An anterior portal was created using an outside-in technique. The labrum and rotator cuff were further probed, again confirming the above-noted findings. The areas of labral fraying were debrided back to stable margins using the full-radius resector, as were areas of synovitis. The ArthroCare wand was inserted and used to release the biceps tendon from its labral anchor. It also was used to obtain hemostasis as well as to "anneal" the labrum superiorly and anteriorly. The instruments were removed from the joint after suctioning the excess fluid.  The camera was repositioned through the posterior portal into the subacromial space. A separate lateral portal was created using an outside-in technique. The 3.5 mm full-radius resector was introduced and used to perform a subtotal bursectomy. The ArthroCare wand was then inserted and used to remove the periosteal tissue off the undersurface of the anterior third of the acromion as well as to recess the coracoacromial ligament from its attachment along the anterior and lateral margins of the acromion. The 4.0 mm acromionizing bur was introduced and used to complete the decompression by removing the undersurface of the anterior third  of the acromion. The full  radius resector was reintroduced to remove any residual bony debris before the ArthroCare wand was reintroduced to obtain hemostasis. The instruments were then removed from the subacromial space after suctioning the excess fluid.  An approximately 4-5 cm incision was made over the anterolateral aspect of the shoulder beginning at the anterolateral corner of the acromion and extending distally in line with the bicipital groove. This incision was carried down through the subcutaneous tissues to expose the deltoid fascia. The raphae between the anterior and middle thirds was identified and this plane developed to provide access into the subacromial space. Additional bursal tissues were debrided sharply using Metzenbaum scissors. The rotator cuff tear was readily identified by palpation.   The bicipital groove was identified by palpation and opened for 1-1.5 cm. The biceps tendon stump was retrieved through this defect. The floor of the bicipital groove was roughened with a curet before a a single Biomet 2.9 mm JuggerKnot anchor was inserted. Both sets of sutures were passed through the biceps tendon and tied securely to effect the tenodesis. The bicipital sheath was reapproximated using two #0 Ethibond interrupted sutures, incorporating the biceps tendon to further reinforce the tenodesis.  The rotator cuff tear was completed and its margins debrided sharply with a #15 blade. The exposed greater tuberosity roughened with a rongeur. The tear was repaired using two [employer] 2.8 mm Q-Fix anchors. These sutures were then brought back laterally and secured using two Smith [employer] Nephew Healicoil knotless RegeneSorb anchors to create a two-layer closure. An apparent watertight closure was obtained.  The wound was copiously irrigated with sterile saline solution before the deltoid raphae was reapproximated using 2-0 Vicryl interrupted sutures. The subcutaneous tissues were closed in two  layers using 2-0 Vicryl interrupted sutures before the skin was closed using staples. The portal sites also were closed using staples. A sterile bulky dressing was applied to the shoulder before the arm was placed into a shoulder immobilizer. The patient was then awakened, extubated, and returned to the recovery room in satisfactory condition after tolerating the procedure well.

## 2023-09-16 NOTE — Transfer of Care (Signed)
Immediate Anesthesia Transfer of Care Note  Patient: Gabriel [name].  Procedure(s) Performed: SHOULDER ARTHROSCOPY WITH DEBRIDEMENT, DECOMPRESSION, ROTATOR CUFF REPAIR AND BICEPS TENODESIS. (Left: Shoulder)  Patient Location: PACU  Anesthesia Type:General  Level of Consciousness: awake and alert   Airway & Oxygen Therapy: Patient Spontanous Breathing and Patient connected to face mask oxygen  Post-op Assessment: Report given to RN and Post -op Vital signs reviewed and stable  Post vital signs: Reviewed and stable  Last Vitals:  Vitals Value Taken Time  BP 137/77 09/16/23 1251  Temp    Pulse 75 09/16/23 1253  Resp 17 09/16/23 1253  SpO2 96 % 09/16/23 1253  Vitals shown include unfiled device data.  Last Pain:  Vitals:   09/16/23 0811  TempSrc: Temporal  PainSc: 0-No pain         Complications: No notable events documented.

## 2023-09-16 NOTE — Progress Notes (Signed)
Anesthesia called:  Dr. Randa Ngo made aware of blood sugar 203:  no new orders, instructed when he arrives home take his regular blood glucose medications.

## 2023-09-16 NOTE — H&P (Signed)
History of Present Illness: Gabriel [name] is a 53 y.o. who presents today for a history and physical. He is to undergo a right shoulder arthroscopy with debridement, decompression, rotator cuff repair and bicep tenodesis on 09/16/2023. Since his last visit at the clinic there has been no improvement in his condition. The patient expresses his desire to proceed with surgery.  The patient's symptoms began several years ago but worsened about 6 months ago, prompting him to go to the emergency room. He was placed into a sling and advised to follow-up with orthopedics. He saw Gean Birchwood, [place], in July, 2024. He was given a steroid injection and started in physical therapy. He notes that the therapy initially seem to be helping, but then his symptoms again worsened. Therefore, he was sent for an MRI scan and referred to me for further evaluation and treatment. The patient describes the symptoms as marked (major pain with significant limitations) and have the quality of being aching, miserable, nagging, stabbing, tender, and throbbing. The pain is localized to the lateral arm/shoulder. These symptoms are aggravated with normal daily activities, with sleeping, carrying heavy objects, at higher levels of activity, with overhead activity, reaching behind the back, and activity in general. He has tried acetaminophen and muscle relaxants with limited benefit. He has tried physical therapy , rest , and ice with no substantial relief of the symptoms. He has tried the one injection described above, also with no substantial relief of his symptoms. The patient denies any neck pain, nor does he note any numbness or paresthesias down his arm to his hand. He is right-hand dominant.   Past Medical History: Acute nasopharyngitis (common cold)  Adjustment disorder with mixed anxiety and depressed mood 2013  Citalopram helpful  Allergic rhinitis due to pollen  Apnea  Cramp of limb  Dizziness and giddiness  Epistaxis   Essential hypertension, benign  Headache(784.0)  Migraines- s/p neurology consult 2013  Hypercalcemia  Hypertrophy of salivary gland  Migraine 1999  Imitrex helpful  Mixed hyperlipidemia  Obesity, unspecified  Obstructive sleep apnea (adult) (pediatric)  Osteoarthrosis, unspecified whether generalized or localized, other specified sites  Other chronic nonalcoholic liver disease  Spasm of muscle  Type II or unspecified type diabetes mellitus without mention of complication, not stated as uncontrolled (CMS/HHS-HCC)  Unspecified vitamin D deficiency   Past Surgical History: 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)   Past Family History: High blood pressure (Hypertension) Mother  Thyroid disease Mother  Diabetes type II Mother  Other Mother  PAD  Depression Neg Hx   Medications: azelastine (ASTELIN) 137 mcg nasal spray Place 2 sprays into both nostrils 2 (two) times daily  ipratropium-albuteroL (DUO-NEB) nebulizer solution Inhale 3 mLs into the lungs every 6 (six) hours as needed for Wheezing or Shortness of Breath  isosorbide mononitrate (IMDUR) 30 MG ER tablet Take 15 mg by mouth once daily  potassium chloride (KLOR-CON M20) 20 MEQ ER tablet Take 20 mEq by mouth every other day  REZDIFFRA 100 mg Tab Take 1 tablet by mouth once daily  TALTZ AUTOINJECTOR 80 mg/mL AtIn Inject 3 mLs subcutaneously every 28 (twenty-eight) days  ACCU-CHEK SOFTCLIX LANCETS lancets USE UP TO FOUR TIMES DAILY  allopurinoL (ZYLOPRIM) 300 MG tablet Take 300 mg by mouth every morning  amLODIPine (NORVASC) 10 MG tablet TAKE ONE TABLET BY MOUTH EVERY DAY 30 tablet 5  ANORO ELLIPTA 62.5-25 mcg/actuation inhaler Inhale 1 Puff into the lungs once daily  aspirin 81 MG EC tablet Take 81 mg by  mouth once daily  BASAGLAR KWIKPEN U-100 INSULIN pen injector (concentration 100 units/mL) Inject subcutaneously  BD NANO 2ND GEN PEN NEEDLE 32 gauge x 5/32" Ndle  buPROPion (WELLBUTRIN XL) 150 MG XL tablet Take 150 mg by  mouth once daily  carvediloL (COREG) 6.25 MG tablet Take 6.25 mg by mouth 2 (two) times daily  cetirizine (ZYRTEC) 10 MG tablet Take 1 tablet by mouth once daily  colchicine (COLCRYS) 0.6 mg tablet Take by mouth as needed  dapagliflozin propanediol (FARXIGA) 10 mg tablet Take 1 tablet by mouth once daily  ENBREL SURECLICK 50 mg/mL (0.98 mL) pen injector Take 50 mg by mouth Once a week.  ENTRESTO 97-103 mg tablet  fenofibrate nanocrystallized (TRICOR) 145 MG tablet Take 145 mg by mouth once daily  FLUoxetine (PROZAC) 20 MG capsule Take 20 mg by mouth once daily  fluticasone (FLONASE) 50 mcg/actuation nasal spray Place 2 sprays into both nostrils continuously as needed.  FREESTYLE LIBRE 3 SENSOR  galcanezumab-gnlm 120 mg/mL PnIj Inject 120 mg subcutaneously monthly 1 mL 5  insulin GLARGINE-yfgn (SEMGLEE) injection (concentration 100 units/mL) Inject 36 Units subcutaneously at bedtime  insulin LISPRO (ADMELOG, HUMALOG) injection (concentration 100 units/mL) Inject 10 Units subcutaneously 3 (three) times daily with meals  meclizine (ANTIVERT) 25 mg tablet Take 25 mg by mouth 3 (three) times daily as needed  naltrexone (REVIA) 50 mg tablet Take 50 mg by mouth once daily  nitroGLYcerin (NITROSTAT) 0.4 MG SL tablet Place 0.4 mg under the tongue every 5 (five) minutes as needed for Chest pain DO NOT EXCEED A TOTAL OF 3 DOSES IN 15 MINUTES  NOVOLOG FLEXPEN U-100 INSULIN pen injector (concentration 100 units/mL) Inject subcutaneously  OZEMPIC 2 mg/dose (8 mg/3 mL) pen injector INJECT 1 DOSE (2MG ) SUBCUTANEOUSLY ONCE A WEEK  rivaroxaban (XARELTO) 20 mg tablet Take 20 mg by mouth once daily  rosuvastatin (CRESTOR) 40 MG tablet Take 40 mg by mouth once daily  TORsemide (DEMADEX) 20 MG tablet Take 40 mg by mouth once daily  VASCEPA 1 gram capsule 1 g 2 (two) times daily  VENTOLIN HFA 90 mcg/actuation inhaler Inhale 2 inhalations into the lungs every 4 (four) hours as needed for Wheezing or Shortness of  Breath   Allergies: Metformin Other (See Comments)  Morphine Unknown  Ranitidine Hcl Other (See Comments)   Review of Systems A comprehensive 14 point ROS was performed, reviewed, and the pertinent orthopaedic findings are documented in the HPI.  Physical Exam: BP 130/80 (BP Location: Left upper arm, Patient Position: Sitting, BP Cuff Size: Adult)  Ht 180.3 cm (5\' 11" )  Wt (!) 111 kg (244 lb 12.8 oz)  BMI 34.14 kg/m   General: Well-developed well-nourished male seen in no acute distress.   HEENT: Atraumatic,normocephalic. Pupils are equal and reactive to light. Oropharynx is clear with moist mucosa  Lungs: Clear to auscultation bilaterally   Cardiovascular: Regular rate and rhythm. Normal S1, S2. No murmurs. No appreciable gallops or rubs. Peripheral pulses are palpable.  Abdomen: Soft, non-tender, nondistended. Bowel sounds present  Left shoulder exam: SKIN: normal SWELLING: none WARMTH: none LYMPH NODES: no adenopathy palpable CREPITUS: none TENDERNESS: Mildly tender over anterior shoulder region ROM (active):  Forward flexion: 100 degrees Abduction: 90 degrees Internal rotation: Left PSIS ROM (passive):  Forward flexion: 155 degrees Abduction: 145 degrees  ER/IR at 90 abd: 80 degrees / 55 degrees  He describes moderate pain at the extremes of all motions.  STRENGTH: Forward flexion: 3+/5 Abduction: 3+/5 External rotation: 4-4+/5 Internal rotation: 4+/5 Pain  with RC testing: Moderate pain with resisted forward flexion and abduction, and mild pain with resisted external rotation.  STABILITY: Normal  SPECIAL TESTS: Juanetta Gosling' test: positive, mild Speed's test: Equivocally positive Capsulitis - pain w/ passive ER: no Crossed arm test: Mildly positive Crank: Not evaluated Anterior apprehension: Negative Posterior apprehension: Not evaluated  Neurological: The patient is alert and oriented Sensation to light touch appears to be intact and within normal  limits Gross motor strength appeared to be equal to 5/5  Vascular : Peripheral pulses felt to be palpable. Capillary refill appears to be intact and within normal limits  Left Shoulder Imaging, MRI: MRI Shoulder Cartilage: No cartilage abnormality. MRI Shoulder Rotator Cuff: Partial thickness tear of the supraspinatus. No retraction. MRI Shoulder Labrum / Biceps: Biceps tendinopathy. MRI Shoulder Bone: Normal bone.  Impression: 1. Nontraumatic incomplete tear of left rotator cuff  Plan:  The treatment options were discussed with the patient. In addition, patient educational materials were provided regarding the diagnosis and treatment options. the patient is quite frustrated by his symptoms and functional limitations, and would like to consider more aggressive treatment options. Therefore, I have recommended a surgical procedure, specifically a left shoulder arthroscopy with debridement, decompression, rotator cuff repair, and probable biceps tenodesis. The procedure was discussed with the patient, as were the potential risks (including bleeding, infection, nerve and/or blood vessel injury, persistent or recurrent pain, failure of the repair, progression of arthritis, need for further surgery, blood clots, strokes, heart attacks and/or arhythmias, pneumonia, etc.) and benefits. The patient states his understanding and wishes to proceed. All of the patient's questions and concerns were answered. He can call any time with further concerns. He will follow up post-surgery, routine.     H&P reviewed and patient re-examined. No changes.

## 2023-09-16 NOTE — Discharge Instructions (Addendum)
Orthopedic discharge instructions: Keep dressing dry and intact.  May shower after dressing changed on post-op day #4 (Saturday).  Cover staples with Band-Aids after drying off. Apply ice frequently to shoulder. Take oxycodone as prescribed when needed.  May supplement with ES Tylenol if necessary. Keep shoulder immobilizer on at all times except may remove for bathing purposes. Follow-up in 10-14 days or as scheduled.

## 2023-09-16 NOTE — Anesthesia Preprocedure Evaluation (Signed)
Anesthesia Evaluation  Patient identified by MRN, date of birth, ID band Patient awake    Reviewed: Allergy & Precautions, NPO status , Patient's Chart, lab work & pertinent test results  History of Anesthesia Complications Negative for: history of anesthetic complications  Airway Mallampati: III  TM Distance: <3 FB Neck ROM: full    Dental  (+) Chipped   Pulmonary neg shortness of breath, sleep apnea , COPD (97% on RA), former smoker   Pulmonary exam normal        Cardiovascular Exercise Tolerance: Good hypertension, (-) angina + CAD, + Past MI, +CHF and + DOE  Normal cardiovascular exam+ Cardiac Defibrillator      Neuro/Psych  PSYCHIATRIC DISORDERS      CVA    GI/Hepatic negative GI ROS,,,(+) Hepatitis -  Endo/Other  diabetes, Type 2    Renal/GU Renal disease     Musculoskeletal   Abdominal   Peds  Hematology negative hematology ROS (+)   Anesthesia Other Findings Past Medical History: 11/28/2016: Aortic root dilation (HCC)     Comment:  a.) TTE 11/28/2016: 43 mm; b.) TTE 11/23/2019: 38 mm;               c.) TTE 08/14/2022: 40 mm; d.) TTE 03/08/2023: 43 mm No date: Arteriosclerosis of right carotid artery     Comment:  a.) carotid doppler 04/29/2017: <50% RICA 07/2023: Bronchitis No date: CAD (coronary artery disease)     Comment:  a.) MV 04/2015: low risk; b.) LHC 01/06/2017: minor irregs              in LAD/Diag/LCX/OM, RCA 40p/m/d; c.) R/LHC 06/13/2020:               50% dLAD, 30% p-mLCx, 40% OM1, 100% pRCA (L-R collats) -               med mgmt; d.) R/LHC 08/19/2023: 50% dLAD, 30% p-mLCx, 40%              OM1, 100% pRCA (L-R collats), 50% p-mRCA - med mgmt No date: Chest wall pain, chronic No date: Chronic 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 No date: Chronic Troponin Elevation No date: CKD (chronic kidney disease), stage II-III No date: COPD (chronic obstructive pulmonary disease) (HCC) No date:  Depression No date: DM (diabetes mellitus), type 2 (HCC) No date: HFrEF (heart failure with reduced ejection fraction) (HCC)     Comment:  a.) EF 03/20/2015: EF 45-50%; b.) MV 05/19/2015: EF               30-44%; c.) TTE 01/04/2016: EF 20-25%; d.) TTE               02/29/2016: EF 30-35%; e.) TTE 11/28/2016: EF 40-45%; f.)              LHC 01/06/2017: EF 25-35%; g.) TTE 04/29/2017: EF 45-50%;              h.) TTE 06/26/2019: EF 30-35%; i.) TTE 11/23/2019: EF               25-30%; j.) TTE 06/08/2020: EF 35-40%; k.) TTE               05/21/2021: EF 40-45%; l.) TTE 08/14/2022: EF 40-45%; m.)              TTE 03/08/2023: EF 40-45% 03/01/2022: History of 2019 novel coronavirus disease (COVID-19) No date: History of methicillin resistant staphylococcus aureus (MRSA) No date: Hyperlipidemia No date: Hypertension 05/20/2022: 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 12/2015 noted on tele-->amio;  b. 12/2015 Event               monitor: no VT noted. No date: Obesity (BMI 30.0-34.9) No date: On rivaroxaban therapy No date: OSA treated with BiPAP No date: Polyp of descending colon No date: Psoriasis No date:  Psoriatic arthritis (HCC)     Comment:  a.) Tx'd with ixekizumab 02/29/2016: Pulmonary HTN (HCC)     Comment:  a.) TTE 02/29/2016: RVSP 47; b.) TTE 06/26/2019: RVSP               58.4; c.) TTE 12/08/2019: RVSP 40.8; d.) TTE 06/08/2020:               RVSP 44.7; e.) R/LHC 06/08/2020: mRA 9, mPA 40, mPWCP 17,              AO sat 98, PA sat 61, CO 3.1, CI 1.3, PVR 7.4, LVEDP 20;               f.) R/LHC 08/18/2020: mRA 5, mPA 41, mPCWP 18, AO sat 92,              PA sat 59, CO 4.3, CI 1.9, PVR 5.3 06/07/2020: Recurrent pulmonary emboli (HCC)     Comment:  06/07/20: small bilateral PEs.  12/31/19: RUL and RLL               PEs. No date: Stroke [facility])     Comment:  pt states per mri in 2023-unsure of when stroke happened No date: Syncope     Comment:  a. 01/2016 - felt to be vasovagal. No date: Thrombocytopenia (HCC)  Past Surgical History: 08/01/2022: COLONOSCOPY WITH PROPOFOL; N/A     Comment:  Procedure: COLONOSCOPY WITH PROPOFOL;  Surgeon: Toney Reil, MD;  Location: [facility] ENDOSCOPY;  Service:               Gastroenterology;  Laterality: N/A; 08/01/2022: ESOPHAGOGASTRODUODENOSCOPY (EGD) WITH PROPOFOL; N/A     Comment:  Procedure: ESOPHAGOGASTRODUODENOSCOPY (EGD) WITH               PROPOFOL;  Surgeon: Toney Reil, MD;  Location:               [facility] ENDOSCOPY;  Service: Gastroenterology;  Laterality:               N/A; 10/16/2022: ESOPHAGOGASTRODUODENOSCOPY (EGD) WITH PROPOFOL; N/A     Comment:  Procedure: ESOPHAGOGASTRODUODENOSCOPY (EGD) WITH  PROPOFOL;  Surgeon: Toney Reil, MD;  Location:               [facility] ENDOSCOPY;  Service: Gastroenterology;  Laterality:               N/A; No date: FINGER AMPUTATION; Left     Comment:  Traumatic 01/06/2017: LEFT HEART CATH AND CORONARY ANGIOGRAPHY; N/A     Comment:  Procedure: Left Heart Cath and Coronary Angiography;                Surgeon: Iran Ouch, MD;  Location: [facility] INVASIVE                CV LAB;  Service: Cardiovascular;  Laterality: N/A; 06/13/2020: RIGHT/LEFT HEART CATH AND CORONARY ANGIOGRAPHY; N/A     Comment:  Procedure: RIGHT/LEFT HEART CATH AND CORONARY               ANGIOGRAPHY;  Surgeon: Yvonne Kendall, MD;  Location:               [facility] INVASIVE CV LAB;  Service: Cardiovascular;                Laterality: N/A; 08/19/2023: RIGHT/LEFT HEART CATH AND CORONARY ANGIOGRAPHY; Bilateral     Comment:  Procedure: RIGHT/LEFT HEART CATH AND CORONARY               ANGIOGRAPHY;  Surgeon: Yvonne Kendall, MD;  Location:               [facility] INVASIVE CV LAB;  Service: Cardiovascular;                Laterality: Bilateral; 05/20/2022: SUBQ ICD IMPLANT; N/A     Comment:  Procedure: SUBQ ICD IMPLANT;  Surgeon: Lanier Prude, MD;  Location: [facility] INVASIVE CV LAB;  Service:               Cardiovascular;  Laterality: N/A;  BMI    Body Mass Index: 34.14 kg/m      Reproductive/Obstetrics negative OB ROS                             Anesthesia Physical Anesthesia Plan  ASA: 3  Anesthesia Plan: General ETT   Post-op Pain Management: Regional block*   Induction: Intravenous  PONV Risk Score and Plan: Ondansetron, Dexamethasone, Midazolam and Treatment may vary due to age or medical condition  Airway Management Planned: Oral ETT  Additional Equipment:   Intra-op Plan:   Post-operative Plan: Extubation in OR  Informed Consent: I have reviewed the patients History and Physical, chart, labs and discussed the procedure including the risks, benefits and alternatives for the proposed anesthesia with the patient or authorized representative who has indicated his/her understanding and acceptance.     Dental Advisory Given  Plan Discussed with: Anesthesiologist, CRNA and Surgeon  Anesthesia Plan Comments: (Patient consented for risks of anesthesia including but not limited to:  - adverse reactions to medications - damage to eyes,  teeth, lips or other oral mucosa - nerve damage due to positioning  - sore throat or hoarseness - Damage to heart, brain, nerves, lungs, other parts of body or loss of life  Patient voiced understanding and assent.)       Anesthesia Quick Evaluation

## 2023-09-16 NOTE — Anesthesia Procedure Notes (Signed)
Anesthesia Regional Block: Interscalene brachial plexus block   Pre-Anesthetic Checklist: , timeout performed,  Correct Patient, Correct Site, Correct Laterality,  Correct Procedure, Correct Position, site marked,  Risks and benefits discussed,  Surgical consent,  Pre-op evaluation,  At surgeon's request and post-op pain management  Laterality: Upper and Left  Prep: chloraprep       Needles:  Injection technique: Single-shot  Needle Type: Stimiplex     Needle Length: 9cm  Needle Gauge: 22     Additional Needles:   Procedures:,,,, ultrasound used (permanent image in chart),,    Narrative:  Start time: 09/16/2023 8:54 AM End time: 09/16/2023 8:56 AM Injection made incrementally with aspirations every 5 mL.  Performed by: Personally   Additional Notes: Patient consented for risk and benefits of nerve block including but not limited to nerve damage, Horner's syndrome, failed block, bleeding and infection.  Patient voiced understanding.  Functioning IV was confirmed and monitors were applied.  Timeout done prior to procedure and prior to any sedation being given to the patient.  Patient confirmed procedure site prior to any sedation given to the patient.  A 50mm 22ga Stimuplex needle was used. Sterile prep,hand hygiene and sterile gloves were used.  Minimal sedation used for procedure.  No paresthesia endorsed by patient during the procedure.  Negative aspiration and negative test dose prior to incremental administration of local anesthetic. The patient tolerated the procedure well with no immediate complications.

## 2023-09-16 NOTE — Anesthesia Postprocedure Evaluation (Signed)
Anesthesia Post Note  Patient: Ashlee Bewley.  Procedure(s) Performed: SHOULDER ARTHROSCOPY WITH DEBRIDEMENT, DECOMPRESSION, ROTATOR CUFF REPAIR AND BICEPS TENODESIS. (Left: Shoulder)  Patient location during evaluation: PACU Anesthesia Type: General Level of consciousness: awake and alert Pain management: pain level controlled Vital Signs Assessment: post-procedure vital signs reviewed and stable Respiratory status: spontaneous breathing, nonlabored ventilation, respiratory function stable and patient connected to nasal cannula oxygen Cardiovascular status: blood pressure returned to baseline and stable Postop Assessment: no apparent nausea or vomiting Anesthetic complications: no   No notable events documented.   Last Vitals:  Vitals:   09/16/23 1330 09/16/23 1352  BP: 136/79 (!) 147/74  Pulse: 83   Resp: 18 20  Temp: (!) 36.1 C   SpO2: 96% 96%    Last Pain:  Vitals:   09/16/23 1330  TempSrc:   PainSc: 0-No pain                 Cleda Mccreedy Kaitlin Alcindor

## 2023-09-16 NOTE — Anesthesia Procedure Notes (Signed)
Procedure Name: Intubation Date/Time: 09/16/2023 11:10 AM  Performed by: Monico Hoar, CRNAPre-anesthesia Checklist: Patient identified, Patient being monitored, Timeout performed, Emergency Drugs available and Suction available Patient Re-evaluated:Patient Re-evaluated prior to induction Oxygen Delivery Method: Circle system utilized Preoxygenation: Pre-oxygenation with 100% oxygen Induction Type: IV induction Ventilation: Mask ventilation without difficulty Laryngoscope Size: Mac and 4 Grade View: Grade I Tube type: Oral Tube size: 7.5 mm Number of attempts: 1 Airway Equipment and Method: Stylet Placement Confirmation: ETT inserted through vocal cords under direct vision, positive ETCO2 and breath sounds checked- equal and bilateral Secured at: 23 cm Tube secured with: Tape Dental Injury: Teeth and Oropharynx as per pre-operative assessment

## 2023-09-18 ENCOUNTER — Encounter: Payer: Self-pay | Admitting: Surgery

## 2023-09-22 ENCOUNTER — Other Ambulatory Visit: Payer: Self-pay

## 2023-09-22 ENCOUNTER — Other Ambulatory Visit: Payer: Self-pay | Admitting: Family Medicine

## 2023-09-22 DIAGNOSIS — G8929 Other chronic pain: Secondary | ICD-10-CM | POA: Diagnosis not present

## 2023-09-22 DIAGNOSIS — E0822 Diabetes mellitus due to underlying condition with diabetic chronic kidney disease: Secondary | ICD-10-CM

## 2023-09-22 DIAGNOSIS — Z9889 Other specified postprocedural states: Secondary | ICD-10-CM | POA: Diagnosis not present

## 2023-09-22 DIAGNOSIS — M25612 Stiffness of left shoulder, not elsewhere classified: Secondary | ICD-10-CM | POA: Diagnosis not present

## 2023-09-22 DIAGNOSIS — M25512 Pain in left shoulder: Secondary | ICD-10-CM | POA: Diagnosis not present

## 2023-09-22 DIAGNOSIS — M6281 Muscle weakness (generalized): Secondary | ICD-10-CM | POA: Diagnosis not present

## 2023-09-23 ENCOUNTER — Other Ambulatory Visit: Payer: Self-pay

## 2023-09-23 MED FILL — Continuous Glucose System Sensor: 84 days supply | Qty: 6 | Fill #0 | Status: AC

## 2023-09-23 NOTE — Progress Notes (Signed)
 Electrophysiology Clinic Note    Date:  09/24/2023  Patient ID:  Gabriel Janik., DOB 08-25-1970, MRN [id number] PCP:  [name] LABOR, FNP  Cardiologist:  [name], MD HF cardiologist: Bensimhon Electrophysiologist: Gabriel ONEIDA HOLTS, MD   Discussed the use of AI scribe software for clinical note transcription with the patient, who gave verbal consent to proceed.   Patient Profile    Chief Complaint: 1 year ICD follow-up  History of Present Illness: Gabriel Becraft. is a 53 y.o. male with PMH notable for HFrEF, ICD, CAD, NSVT, CKD-3, COPD, recurrent PE on xarelto , OSA on CPAP, T2DM; seen today for Gabriel ONEIDA HOLTS, MD for routine electrophysiology followup.  He last saw Dr. Holts 08/2022 for routine 91d follow-up after sub-q ICD placed.   On follow-up today, he recently underwent L shoulder surgery and has ongoing L flank edema. He starts PT soon.  The patient describes brief palpitations that feel like a fluttering sensation in the chest, visible externally, and occurring approximately three times in the past month. Each episode lasts about fifteen to twenty seconds. The patient reports feeling mildly short of breath during these episodes depending on his position. The patient also notes that he can hear his heartbeat, which he describes as stopping after about thirty beats before resuming. This has been occurring since before his recent shoulder surgery. He denies chest pain, dizziness, presyncope during palpitation episodes.  He has follow-up soon with Dr. Bensimhon.  He denies increased edema, weight is stable, has good appetite.    Device Information: [employer] Sci S-ICD, imp 05/2022; dx ICM  AAD History: none    ROS:  Please see the history of present illness. All other systems are reviewed and otherwise negative.    Physical Exam    VS:  BP 128/80 (BP Location: Right Wrist, Patient Position: Sitting, Cuff Size: Normal)   Pulse 99   Ht 5' 11 (1.803 m)    Wt 248 lb 3.2 oz (112.6 kg)   SpO2 96%   BMI 34.62 kg/m  BMI: Body mass index is 34.62 kg/m.  Wt Readings from Last 3 Encounters:  09/24/23 248 lb 3.2 oz (112.6 kg)  09/16/23 244 lb 12.8 oz (111 kg)  08/29/23 244 lb 12.8 oz (111 kg)     GEN- The patient is well appearing, alert and oriented x 3 today.   Lungs- Clear to ausculation bilaterally, normal work of breathing.  Heart- Regular rate and rhythm, no murmurs, rubs or gallops Extremities- No peripheral edema, warm, dry;  Skin-   device pocket well-healed, no tethering   Device interrogation done today and reviewed by myself:  Battery 85% Lead thresholds, impedence, sensing stable Episodes concerning for AF vs frequent PACs   Studies Reviewed   Previous EP, cardiology notes.    EKG is not ordered. Personal review of EKG from  08/29/2023  shows:  SR at 78bpm        [facility], 08/19/2023 Conclusions: Relatively stable appearance of the coronary arteries since last catheterization in 2021.  Chronically occluded proximal RCA now has antegrade flow, though I suspect this represents bridging collaterals.  Left-to-right collateral remain as well as moderate LAD and LCx disease. Mildly elevated left heart filling pressures (LVEDP 20 mmHg, PCWP 18 mmHg). Moderate pulmonary hypertension (mean PA 41 mmHg, PVR 5.3 WU). Moderately reduced Fick cardiac output/index (CO 4.3 L/min, CI 1.9 L/min/m^2).   Recommendations: Add isosorbide  mononitrate for antianginal therapy, though I suspect symptoms are driven primarily  by heart failure and pulmonary hypertension. Continue current GDMT and torsemide ; continue close follow-up in the heart failure clinic. Continue secondary prevention of coronary artery disease. If no evidence of bleeding or vascular injury at catheterization sites, rivaroxaban  can be resumed this evening.  TTE, 03/08/2023  1. Left ventricular ejection fraction, by estimation, is 45 to 50%. The left ventricle has mildly  decreased function. The left ventricle demonstrates global hypokinesis. The left ventricular internal cavity size was mildly to moderately dilated. There  is moderate concentric left ventricular hypertrophy. Left ventricular diastolic function could not be evaluated.   2. Right ventricular systolic function is normal. The right ventricular size is normal. Tricuspid regurgitation signal is inadequate for assessing PA pressure.   3. The mitral valve is degenerative. Trivial mitral valve regurgitation. No evidence of mitral stenosis.   4. The aortic valve is calcified. Aortic valve regurgitation is mild. Aortic valve sclerosis/calcification is present, without any evidence of aortic stenosis.   5. Aortic dilatation noted. There is mild dilatation of the aortic root, measuring 43 mm.   TTE, 08/14/2022  1. Left ventricular ejection fraction, by estimation, is 40 to 45%. The left ventricle has mildly decreased function. The left ventricle demonstrates regional wall motion abnormalities (see scoring diagram/findings for description). The left ventricular  internal cavity size was mildly dilated. There is mild left ventricular  hypertrophy. Left ventricular diastolic parameters are consistent with Grade I diastolic dysfunction (impaired relaxation). There is severe hypokinesis of the left ventricular, entire inferior wall and inferolateral wall.   2. Right ventricular systolic function is normal. The right ventricular size is normal. Tricuspid regurgitation signal is inadequate for assessing PA pressure.   3. Left atrial size was mildly dilated.   4. The mitral valve is normal in structure. No evidence of mitral valve regurgitation. No evidence of mitral stenosis.   5. The aortic valve is normal in structure. Aortic valve regurgitation is mild. Aortic valve sclerosis/calcification is present, without any evidence of aortic stenosis.   6. Aortic dilatation noted. There is mild dilatation of the aortic root,  measuring 40 mm.   Comparison(s): A prior study was performed on 11/24/2021. Changes from prior study are noted. The left ventricular function has improved.   Limited TTE, 02/06/2022  1. Left ventricular ejection fraction, by estimation, is 30 to 35%. The left ventricle has moderate to severely decreased function. Left ventricular diastolic parameters are consistent with Grade II diastolic dysfunction (pseudonormalization).   2. Aortic valve regurgitation is mild to moderate.    Assessment and Plan     #) NICM #) ICD in situ Device functioning well, no therapies  #) HFmrEF Appears euvolemic and well-compensated on exam Continue follow-up with HF team   #) palpitations Patient reports palpitations approximately three times in the last month, lasting 15-20 seconds each. No associated chest pain or syncope, occasional mild shortness of breath. - Symptomatic episodes do not correlate with ?AFib episodes on s-ICD -Apply a two-week monitor to further evaluate the nature of these palpitations. -Instruct patient to press the monitor button if palpitations are felt. -Schedule follow-up appointment in six weeks to discuss monitor results.        Current medicines are reviewed at length with the patient today.   The patient does not have concerns regarding his medicines.  The following changes were made today:  none  Labs/ tests ordered today include:  Orders Placed This Encounter  Procedures   LONG TERM MONITOR (3-14 DAYS)   LONG TERM MONITOR (3-14  DAYS)     Disposition: Follow up with Dr. Cindie or EP APP  6-8 weeks  to review zio monitor results   Signed, Chantal Needle, NP  09/24/23  10:43 AM  Electrophysiology [facility]

## 2023-09-24 ENCOUNTER — Ambulatory Visit: Payer: Medicaid Other | Attending: Cardiology | Admitting: Cardiology

## 2023-09-24 ENCOUNTER — Ambulatory Visit: Payer: Medicaid Other

## 2023-09-24 ENCOUNTER — Encounter: Payer: Self-pay | Admitting: Cardiology

## 2023-09-24 VITALS — BP 128/80 | HR 99 | Ht 71.0 in | Wt 248.2 lb

## 2023-09-24 DIAGNOSIS — R002 Palpitations: Secondary | ICD-10-CM

## 2023-09-24 DIAGNOSIS — Z95810 Presence of automatic (implantable) cardiac defibrillator: Secondary | ICD-10-CM | POA: Diagnosis not present

## 2023-09-24 DIAGNOSIS — I5022 Chronic systolic (congestive) heart failure: Secondary | ICD-10-CM

## 2023-09-24 LAB — CUP PACEART INCLINIC DEVICE CHECK
Date Time Interrogation Session: 20250205111801
Implantable Lead Connection Status: 753985
Implantable Lead Implant Date: 20231002
Implantable Lead Location: 753862
Implantable Lead Model: 3501
Implantable Lead Serial Number: 221766
Implantable Pulse Generator Implant Date: 20231002
Pulse Gen Serial Number: 189685

## 2023-09-24 NOTE — Patient Instructions (Addendum)
 Medication Instructions:  The current medical regimen is effective;  continue present plan and medications.  *If you need a refill on your cardiac medications before your next appointment, please call your pharmacy*   Testing/Procedures:  ZIO XT- Long Term Monitor Instructions  Your physician has requested you wear a ZIO patch monitor for 14 days.  This is a single patch monitor. [employer] supplies one patch monitor per enrollment. Additional stickers are not available. Please do not apply patch if you will be having a Nuclear Stress Test,  Echocardiogram, Cardiac CT, MRI, or Chest Xray during the period you would be wearing the  monitor. The patch cannot be worn during these tests. You cannot remove and re-apply the  ZIO XT patch monitor.  Your ZIO patch monitor will be mailed 3 day USPS to your address on file. It may take 3-5 days  to receive your monitor after you have been enrolled.  Once you have received your monitor, please review the enclosed instructions. Your monitor  has already been registered assigning a specific monitor serial # to you.  Billing and Patient Assistance Program Information  We have supplied [employer] with any of your insurance information on file for billing purposes. [employer] offers a sliding scale Patient Assistance Program for patients that do not have  insurance, or whose insurance does not completely cover the cost of the ZIO monitor.  You must apply for the Patient Assistance Program to qualify for this discounted rate.  To apply, please call [employer] at [phone], select option 4, select option 2, ask to apply for  Patient Assistance Program. Meredeth will ask your household income, and how many people  are in your household. They will quote your out-of-pocket cost based on that information.  [employer] will also be able to set up a 43-month, interest-free payment plan if needed.  Applying the monitor   Shave hair from upper left chest.  Hold abrader  disc by orange tab. Rub abrader in 40 strokes over the upper left chest as  indicated in your monitor instructions.  Clean area with 4 enclosed alcohol pads. Let dry.  Apply patch as indicated in monitor instructions. Patch will be placed under collarbone on left  side of chest with arrow pointing upward.  Rub patch adhesive wings for 2 minutes. Remove white label marked 1. Remove the white  label marked 2. Rub patch adhesive wings for 2 additional minutes.  While looking in a mirror, press and release button in center of patch. A small green light will  flash 3-4 times. This will be your only indicator that the monitor has been turned on.  Do not shower for the first 24 hours. You may shower after the first 24 hours.  Press the button if you feel a symptom. You will hear a small click. Record Date, Time and  Symptom in the Patient Logbook.  When you are ready to remove the patch, follow instructions on the last 2 pages of Patient  Logbook. Stick patch monitor onto the last page of Patient Logbook.  Place Patient Logbook in the blue and white box. Use locking tab on box and tape box closed  securely. The blue and white box has prepaid postage on it. Please place it in the mailbox as  soon as possible. Your physician should have your test results approximately 7 days after the  monitor has been mailed back to [facility].  Call [facility] Customer Care at [phone] if you have questions regarding  your ZIO  XT patch monitor. Call them immediately if you see an orange light blinking on your  monitor.  If your monitor falls off in less than 4 days, contact our Monitor department at [phone].  If your monitor becomes loose or falls off after 4 days call [employer] at [phone] for  suggestions on securing your monitor    Follow-Up: At [facility], you and your health needs are our priority.  As part of our continuing mission to provide you with exceptional heart  care, we have created designated Provider Care Teams.  These Care Teams include your primary Cardiologist (physician) and Advanced Practice Providers (APPs -  Physician Assistants and Nurse Practitioners) who all work together to provide you with the care you need, when you need it.  We recommend signing up for the patient portal called MyChart.  Sign up information is provided on this After Visit Summary.  MyChart is used to connect with patients for Virtual Visits (Telemedicine).  Patients are able to view lab/test results, encounter notes, upcoming appointments, etc.  Non-urgent messages can be sent to your provider as well.   To learn more about what you can do with MyChart, go to [email or web address].    Your next appointment:   6-7 week(s)  Provider:   Chantal Needle, NP   12 month follow up with Dr.Lambert

## 2023-09-25 ENCOUNTER — Other Ambulatory Visit: Payer: Self-pay

## 2023-09-29 ENCOUNTER — Inpatient Hospital Stay: Payer: Medicaid Other | Attending: Oncology

## 2023-09-29 ENCOUNTER — Inpatient Hospital Stay (HOSPITAL_BASED_OUTPATIENT_CLINIC_OR_DEPARTMENT_OTHER): Payer: Medicaid Other | Admitting: Oncology

## 2023-09-29 ENCOUNTER — Encounter: Payer: Self-pay | Admitting: Oncology

## 2023-09-29 ENCOUNTER — Inpatient Hospital Stay: Payer: Medicaid Other

## 2023-09-29 VITALS — HR 86 | Temp 97.8°F | Resp 19 | Wt 247.8 lb

## 2023-09-29 DIAGNOSIS — D751 Secondary polycythemia: Secondary | ICD-10-CM

## 2023-09-29 DIAGNOSIS — Z86711 Personal history of pulmonary embolism: Secondary | ICD-10-CM | POA: Insufficient documentation

## 2023-09-29 DIAGNOSIS — M25512 Pain in left shoulder: Secondary | ICD-10-CM | POA: Diagnosis not present

## 2023-09-29 DIAGNOSIS — M6281 Muscle weakness (generalized): Secondary | ICD-10-CM | POA: Diagnosis not present

## 2023-09-29 DIAGNOSIS — G8929 Other chronic pain: Secondary | ICD-10-CM | POA: Diagnosis not present

## 2023-09-29 DIAGNOSIS — M25612 Stiffness of left shoulder, not elsewhere classified: Secondary | ICD-10-CM | POA: Diagnosis not present

## 2023-09-29 DIAGNOSIS — Z9889 Other specified postprocedural states: Secondary | ICD-10-CM | POA: Diagnosis not present

## 2023-09-29 LAB — CMP (CANCER CENTER ONLY)
ALT: 24 U/L (ref 0–44)
AST: 22 U/L (ref 15–41)
Albumin: 4.1 g/dL (ref 3.5–5.0)
Alkaline Phosphatase: 73 U/L (ref 38–126)
Anion gap: 11 (ref 5–15)
BUN: 16 mg/dL (ref 6–20)
CO2: 25 mmol/L (ref 22–32)
Calcium: 9.3 mg/dL (ref 8.9–10.3)
Chloride: 102 mmol/L (ref 98–111)
Creatinine: 1.2 mg/dL (ref 0.61–1.24)
GFR, Estimated: >60 mL/min (ref >60)
Glucose, Bld: 175 mg/dL — ABNORMAL HIGH (ref 70–99)
Potassium: 3.9 mmol/L (ref 3.5–5.1)
Sodium: 138 mmol/L (ref 135–145)
Total Bilirubin: 1.3 mg/dL — ABNORMAL HIGH (ref 0.0–1.2)
Total Protein: 7.8 g/dL (ref 6.5–8.1)

## 2023-09-29 LAB — CBC WITH DIFFERENTIAL/PLATELET
Abs Immature Granulocytes: 0.04 10*3/uL (ref 0.00–0.07)
Basophils Absolute: 0.1 10*3/uL (ref 0.0–0.1)
Basophils Relative: 1 %
Eosinophils Absolute: 0.2 10*3/uL (ref 0.0–0.5)
Eosinophils Relative: 3 %
HCT: 52.6 % — ABNORMAL HIGH (ref 39.0–52.0)
Hemoglobin: 17 g/dL (ref 13.0–17.0)
Immature Granulocytes: 1 %
Lymphocytes Relative: 24 %
Lymphs Abs: 1.8 10*3/uL (ref 0.7–4.0)
MCH: 26.6 pg (ref 26.0–34.0)
MCHC: 32.3 g/dL (ref 30.0–36.0)
MCV: 82.2 fL (ref 80.0–100.0)
Monocytes Absolute: 0.5 10*3/uL (ref 0.1–1.0)
Monocytes Relative: 6 %
Neutro Abs: 5 10*3/uL (ref 1.7–7.7)
Neutrophils Relative %: 65 %
Platelets: 183 10*3/uL (ref 150–400)
RBC: 6.4 MIL/uL — ABNORMAL HIGH (ref 4.22–5.81)
RDW: 16 % — ABNORMAL HIGH (ref 11.5–15.5)
WBC: 7.6 10*3/uL (ref 4.0–10.5)
nRBC: 0 % (ref 0.0–0.2)

## 2023-09-29 NOTE — Patient Instructions (Signed)

## 2023-09-29 NOTE — Progress Notes (Signed)
 [facility]  [phone] [phone]  ID: Gabriel [name]. OB: Nov 01, 1970  MR#: [id number]  ION#:[id number]  Patient Care Team: Tasia Farr, FNP as PCP - General (Family Medicine) Devorah Fonder, MD as PCP - Cardiology (Cardiology) Boyce Byes, MD as PCP - Electrophysiology (Cardiology) Charlette Console, FNP as Nurse Practitioner (Family Medicine) Devorah Fonder, MD as Consulting Physician (Cardiology) Alla Isaacs, Severa Daniels, RPH-CPP as Pharmacist  CHIEF COMPLAINT: Secondary polycythemia.  INTERVAL HISTORY: Patient returns to clinic today for repeat laboratory, further evaluation, and consideration of additional phlebotomy.  He recently underwent left shoulder rotator cuff repair, but otherwise has felt well.  He has no neurologic complaints.  He denies any recent fevers or illnesses.  He has a good appetite and denies weight loss.  He does not complain of weakness or fatigue today.  He has no chest pain, shortness of breath, cough, or hemoptysis.  He denies any nausea, vomiting, constipation, or diarrhea.  He has no urinary complaints.  Patient offers no further specific complaints today.  REVIEW OF SYSTEMS:   Review of Systems  Constitutional: Negative.  Negative for fever, malaise/fatigue and weight loss.  Respiratory: Negative.  Negative for cough, hemoptysis and shortness of breath.   Cardiovascular: Negative.  Negative for chest pain and leg swelling.  Gastrointestinal: Negative.  Negative for abdominal pain.  Genitourinary: Negative.  Negative for dysuria.  Musculoskeletal: Negative.  Negative for back pain and joint pain.  Skin: Negative.  Negative for rash.  Neurological: Negative.  Negative for dizziness, focal weakness, weakness and headaches.  Psychiatric/Behavioral: Negative.  The patient is not nervous/anxious.     As per HPI. Otherwise, a complete review of systems is negative.  PAST MEDICAL HISTORY: Past Medical  History:  Diagnosis Date   Aortic root dilation (HCC) 11/28/2016   a.) TTE 11/28/2016: 43 mm; b.) TTE 11/23/2019: 38 mm; c.) TTE 08/14/2022: 40 mm; d.) TTE 03/08/2023: 43 mm   Arteriosclerosis of right carotid artery    a.) carotid doppler 04/29/2017: <50% RICA   Bronchitis 07/2023   CAD (coronary artery disease)    a.) MV 04/2015: low risk; b.) LHC 01/06/2017: minor irregs in LAD/Diag/LCX/OM, RCA 40p/m/d; c.) R/LHC 06/13/2020: 50% dLAD, 30% p-mLCx, 40% OM1, 100% pRCA (L-R collats) - med mgmt; d.) R/LHC 08/19/2023: 50% dLAD, 30% p-mLCx, 40% OM1, 100% pRCA (L-R collats), 50% p-mRCA - med mgmt   Chest wall pain, chronic    Chronic 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    Chronic Troponin Elevation    CKD (chronic kidney disease), stage II-III    COPD (chronic obstructive pulmonary disease) (HCC)    Depression    DM (diabetes mellitus), type 2 (HCC)    HFrEF (heart failure with reduced ejection fraction) (HCC)    a.) EF 03/20/2015: EF 45-50%; b.) MV 05/19/2015: EF 30-44%; c.) TTE 01/04/2016: EF 20-25%; d.) TTE 02/29/2016: EF 30-35%; e.) TTE 11/28/2016: EF 40-45%; f.) LHC 01/06/2017: EF 25-35%; g.) TTE 04/29/2017: EF 45-50%; h.) TTE 06/26/2019: EF 30-35%; i.) TTE 11/23/2019: EF 25-30%; j.) TTE 06/08/2020: EF 35-40%; k.) TTE 05/21/2021: EF 40-45%; l.) TTE 08/14/2022: EF 40-45%; m.) TTE 03/08/2023: EF 40-45%   History of 2019 novel coronavirus disease (COVID-19) 03/01/2022   History of methicillin resistant staphylococcus aureus (MRSA)    Hyperlipidemia    Hypertension    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 12/2015 noted on tele-->amio;  b. 12/2015 Event monitor: no VT noted.   Obesity (BMI 30.0-34.9)    On rivaroxaban  therapy    OSA treated with BiPAP    Polyp of descending colon    Psoriasis    Psoriatic arthritis (HCC)    a.) Tx'd with ixekizumab   Pulmonary HTN (HCC) 02/29/2016   a.) TTE 02/29/2016: RVSP 47; b.) TTE 06/26/2019: RVSP 58.4; c.) TTE 12/08/2019: RVSP 40.8; d.) TTE 06/08/2020: RVSP 44.7; e.) R/LHC 06/08/2020: mRA 9, mPA 40, mPWCP 17, AO sat 98, PA sat 61, CO 3.1, CI 1.3, PVR 7.4, LVEDP 20; f.) R/LHC 08/18/2020: mRA 5, mPA 41, mPCWP 18, AO sat 92, PA sat 59, CO 4.3, CI 1.9, PVR 5.3   Recurrent pulmonary emboli (HCC) 06/07/2020   06/07/20: small bilateral PEs.  12/31/19: RUL and RLL PEs.   Stroke [facility])    pt states per mri in 2023-unsure of when stroke happened   Syncope    a. 01/2016 - felt to be vasovagal.   Thrombocytopenia (HCC)     PAST SURGICAL HISTORY: Past Surgical History:  Procedure Laterality Date   COLONOSCOPY WITH PROPOFOL  N/A 08/01/2022   Procedure: COLONOSCOPY WITH PROPOFOL ;  Surgeon: Selena Daily, MD;  Location: [facility] ENDOSCOPY;  Service: Gastroenterology;  Laterality: N/A;   ESOPHAGOGASTRODUODENOSCOPY (EGD) WITH PROPOFOL  N/A 08/01/2022   Procedure: ESOPHAGOGASTRODUODENOSCOPY (EGD) WITH PROPOFOL ;  Surgeon: Selena Daily, MD;  Location: [facility] ENDOSCOPY;  Service: Gastroenterology;  Laterality: N/A;   ESOPHAGOGASTRODUODENOSCOPY (EGD) WITH PROPOFOL  N/A 10/16/2022   Procedure: ESOPHAGOGASTRODUODENOSCOPY (EGD) WITH PROPOFOL ;  Surgeon: Selena Daily, MD;  Location: [facility] ENDOSCOPY;  Service: Gastroenterology;  Laterality: N/A;   FINGER  AMPUTATION Left    Traumatic   LEFT HEART CATH AND CORONARY ANGIOGRAPHY N/A 01/06/2017   Procedure: Left Heart Cath and Coronary Angiography;  Surgeon: Wenona Hamilton, MD;  Location: [facility] INVASIVE CV LAB;  Service: Cardiovascular;  Laterality: N/A;   RIGHT/LEFT HEART CATH AND CORONARY ANGIOGRAPHY N/A 06/13/2020   Procedure: RIGHT/LEFT HEART CATH AND CORONARY ANGIOGRAPHY;  Surgeon: Sammy Crisp, MD;  Location: [facility] INVASIVE CV LAB;  Service: Cardiovascular;  Laterality: N/A;   RIGHT/LEFT HEART CATH AND CORONARY ANGIOGRAPHY Bilateral 08/19/2023   Procedure: RIGHT/LEFT HEART CATH AND CORONARY ANGIOGRAPHY;  Surgeon: Sammy Crisp, MD;  Location: [facility] INVASIVE CV LAB;  Service: Cardiovascular;  Laterality: Bilateral;   SHOULDER ARTHROSCOPY WITH SUBACROMIAL DECOMPRESSION, ROTATOR CUFF REPAIR AND BICEP TENDON REPAIR Left 09/16/2023   Procedure: SHOULDER ARTHROSCOPY WITH DEBRIDEMENT, DECOMPRESSION, ROTATOR CUFF REPAIR AND BICEPS TENODESIS.;  Surgeon: Elner Hahn, MD;  Location: [facility] ORS;  Service: Orthopedics;  Laterality: Left;   SUBQ ICD IMPLANT N/A 05/20/2022   Procedure: SUBQ ICD IMPLANT;  Surgeon: Boyce Byes, MD;  Location: [facility] INVASIVE CV LAB;  Service: Cardiovascular;  Laterality: N/A;    FAMILY HISTORY: Family History  Problem Relation Age of Onset   Diabetes Mother  Diabetes Mellitus II Mother    Hypothyroidism Mother    Hypertension Mother    Kidney failure Mother        Dialysis   Heart attack Mother        2 yo approximately   Hypertension Father    Gout Father    Cancer Maternal Grandfather    Diabetes Maternal Grandfather    Cancer Paternal Aunt     ADVANCED DIRECTIVES (Y/N):  N  HEALTH MAINTENANCE: Social History   Tobacco Use   Smoking status: Former    Current packs/day: 0.00    Average packs/day: 0.5 packs/day for 33.0 years (16.5 ttl pk-yrs)    Types: Cigarettes    Start date: 05/09/1987    Quit date: 05/08/2020    Years since quitting: 3.3    Smokeless tobacco: Former    Quit date: 05/08/2020   Tobacco comments:    Quit Sept 2021  Vaping Use   Vaping status: Former  Substance Use Topics   Alcohol use: Not Currently   Drug use: No     Colonoscopy:  PAP:  Bone density:  Lipid panel:  Allergies  Allergen Reactions   Metformin  And Related Nausea And Vomiting   Zantac  [Ranitidine  Hcl] Diarrhea and Nausea Only    Night sweats    Current Outpatient Medications  Medication Sig Dispense Refill   Accu-Chek Softclix Lancets lancets USE UP TO FOUR TIMES DAILY     albuterol  (VENTOLIN  HFA) 108 (90 Base) MCG/ACT inhaler Inhale 2 puffs into the lungs [name] 4 (four) hours as needed for wheezing or shortness of breath. 8 g 6   allopurinol  (ZYLOPRIM ) 300 MG tablet Take 300 mg by mouth [name] morning.     aspirin  EC 81 MG tablet Take 81 mg by mouth [name] morning.     azelastine  (ASTELIN ) 0.1 % nasal spray Place 2 sprays into both nostrils 2 (two) times daily. Use in each nostril as directed 30 mL 12   BD PEN NEEDLE NANO 2ND GEN 32G X 4 MM MISC USE  AS DIRECTED AT BEDTIME 200 each 0   blood glucose meter kit and supplies KIT Dispense based on patient and insurance preference. Use up to four times daily as directed. 1 each 11   carvedilol  (COREG ) 6.25 MG tablet Take 1 tablet (6.25 mg total) by mouth 2 (two) times daily.     cetirizine  (ZYRTEC ) 10 MG tablet Take 1 tablet (10 mg total) by mouth [name] morning.     colchicine  0.6 MG tablet Take 1 tablet (0.6 mg total) by mouth daily as needed (gout flare).     Continuous Glucose Sensor (FREESTYLE LIBRE 3 SENSOR) MISC PLACE 1 SENSOR ON TO THE SKIN [name] 14 DAYS. USE TO CHECK BLOOD SUGAR CONTINUEOUSLY.     Continuous Glucose Sensor (FREESTYLE LIBRE 3 SENSOR) MISC 1 Application by Does not apply route [name] 14 (fourteen) days. Use to check blood sugar continuously. 6 each 0   Continuous Glucose Sensor (FREESTYLE LIBRE 3 SENSOR) MISC Apply sensor [name] 14 (fourteen) days to check blood sugar  continuously. 6 each 0   dapagliflozin  propanediol (FARXIGA ) 10 MG TABS tablet Take 1 tablet (10 mg total) by mouth daily. 90 tablet 3   fenofibrate  (TRICOR ) 145 MG tablet Take 1 tablet (145 mg total) by mouth [name] evening.     FLUoxetine  (PROZAC ) 20 MG capsule Take 1 capsule (20 mg total) by mouth [name] evening.     fluticasone  (FLONASE ) 50 MCG/ACT nasal spray Place 2 sprays into  both nostrils daily as needed for allergies or rhinitis. Home med.     Insulin  Glargine (BASAGLAR  KWIKPEN) 100 UNIT/ML Inject 36 Units into the skin at bedtime. 45 mL 3   insulin  lispro (HUMALOG ) 100 UNIT/ML injection Inject 10 Units into the skin 3 (three) times daily before meals.     Insulin  Pen Needle 32G X 4 MM MISC 1 each by Does not apply route in the morning, at noon, in the evening, and at bedtime. 120 each 11   ipratropium-albuterol  (DUONEB) 0.5-2.5 (3) MG/3ML SOLN Take 3 mLs by nebulization [name] 6 (six) hours as needed. 360 mL 11   isosorbide  mononitrate (IMDUR ) 30 MG 24 hr tablet Take 0.5 tablets (15 mg total) by mouth [name] morning.     meclizine  (ANTIVERT ) 25 MG tablet Take 0.5 tablets (12.5 mg total) by mouth 3 (three) times daily as needed for dizziness.     metoCLOPramide  (REGLAN ) 5 MG tablet Take 1 tablet (5 mg total) by mouth [name] 8 (eight) hours as needed for nausea. 30 tablet 0   naltrexone  (DEPADE) 50 MG tablet Take 1 tablet (50 mg total) by mouth [name] morning.     nitroGLYCERIN  (NITROSTAT ) 0.4 MG SL tablet Place 1 tablet (0.4 mg total) under the tongue [name] 5 (five) minutes x 3 doses as needed for chest pain. 30 tablet 0   omeprazole  (PRILOSEC) 40 MG capsule Take 1 capsule (40 mg total) by mouth [name] morning.     oxyCODONE  (ROXICODONE ) 5 MG immediate release tablet Take 1-2 tablets (5-10 mg total) by mouth [name] 4 (four) hours as needed for moderate pain (pain score 4-6) or severe pain (pain score 7-10). 40 tablet 0   potassium chloride  SA (KLOR-CON  M) 20 MEQ tablet Take 1 tablet (20 mEq total)  by mouth [name] other day. AM     Resmetirom  (REZDIFFRA ) 100 MG TABS Take 1 tablet by mouth at bedtime.     rivaroxaban  (XARELTO ) 20 MG TABS tablet Take 1 tablet (20 mg total) by mouth daily with supper. 90 tablet 3   rosuvastatin  (CRESTOR ) 20 MG tablet Take 1 tablet (20 mg total) by mouth at bedtime.     sacubitril -valsartan  (ENTRESTO ) 97-103 MG Take 1 tablet by mouth 2 (two) times daily.     Semaglutide , 2 MG/DOSE, (OZEMPIC , 2 MG/DOSE,) 8 MG/3ML SOPN Inject 2 mg into the skin once a week. Tuesdays     spironolactone  (ALDACTONE ) 25 MG tablet Take 1 tablet by mouth once daily 90 tablet 1   TALTZ 80 MG/ML SOAJ Inject 3 mLs into the skin [name] 28 (twenty-eight) days.     torsemide  (DEMADEX ) 20 MG tablet Take 1 tablet (20 mg total) by mouth [name] morning.     umeclidinium-vilanterol (ANORO ELLIPTA ) 62.5-25 MCG/ACT AEPB Inhale 1 puff into the lungs [name] morning.     No current facility-administered medications for this visit.    OBJECTIVE: Vitals:   09/29/23 1412  Pulse: 86  Resp: 19  Temp: 97.8 F (36.6 C)  SpO2: 98%     Body mass index is 34.56 kg/m.    ECOG FS:0 - Asymptomatic  General: Well-developed, well-nourished, no acute distress. Eyes: Pink conjunctiva, anicteric sclera. HEENT: Normocephalic, moist mucous membranes. Lungs: No audible wheezing or coughing. Heart: Regular rate and rhythm. Abdomen: Soft, nontender, no obvious distention. Musculoskeletal: No edema, cyanosis, or clubbing. Neuro: Alert, answering all questions appropriately. Cranial nerves grossly intact. Skin: No rashes or petechiae noted. Psych: Normal affect.  LAB RESULTS:  Lab Results  Component Value  Date   NA 138 09/29/2023   K 3.9 09/29/2023   CL 102 09/29/2023   CO2 25 09/29/2023   GLUCOSE 175 (H) 09/29/2023   BUN 16 09/29/2023   CREATININE 1.20 09/29/2023   CALCIUM  9.3 09/29/2023   PROT 7.8 09/29/2023   ALBUMIN  4.1 09/29/2023   AST 22 09/29/2023   ALT 24 09/29/2023   ALKPHOS 73 09/29/2023    BILITOT 1.3 (H) 09/29/2023   GFRNONAA >60 09/29/2023   GFRAA >60 05/14/2020    Lab Results  Component Value Date   WBC 7.6 09/29/2023   NEUTROABS 5.0 09/29/2023   HGB 17.0 09/29/2023   HCT 52.6 (H) 09/29/2023   MCV 82.2 09/29/2023   PLT 183 09/29/2023     STUDIES: CUP PACEART INCLINIC DEVICE CHECK Result Date: 09/24/2023 Subcutaneous ICD check in clinic. 0 untreated episodes; 0 treated episodes; 0 shocks delivered. parox AFib episodes. Electrode impedance status okay. No programming changes. Remaining longevity to ERI 85__%. Maylene Spear, NP  US  OR NERVE BLOCK-IMAGE ONLY [facility]) Result Date: 09/16/2023 There is no interpretation for this exam.  This order is for images obtained during a surgical procedure.  Please See "Surgeries" Tab for more information regarding the procedure.    ASSESSMENT: Secondary polycythemia.  PLAN:    Secondary polycythemia: Previously, entire workup was negative for etiology including JAK2 and BCR-ABL mutations.  Possibly secondary to underlying COPD versus heart disease.  Goal hemoglobin is hematocrit less than 50.  Currently, his hematocrit is 52.6 and patient agreed to additional phlebotomy.  Unfortunately, IV access was difficult and only 100 mL was removed.  Patient did not wish to attempt additional needle sticks.  No intervention is needed.  Return to clinic in 4 months as previously scheduled for repeat laboratory work, further evaluation, and consideration of phlebotomy. 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 Rotator cuff repair: Patient reports his pain is significantly improved.  Continue follow-up with orthopedics as scheduled.  Patient expressed understanding and was in agreement with this plan. He also understands that He can call clinic at any time with any questions, concerns, or complaints.    Shellie Dials, MD   09/29/2023 4:05 PM

## 2023-09-29 NOTE — Progress Notes (Signed)
 Attempted phlebotomy, thick & running really slow, only 100cc removed. 2nd stick unsuccessful. Patient with should surgery only one arm available to stick. Also on blood thinners. Patient declined to proceed. MD notified, ok per MD to stop phlebotomy. No need to add on additional phlebotomy  appointment. Keep f/u as as scheduled. Patient discharged. Stable. Refused AVS

## 2023-09-30 ENCOUNTER — Other Ambulatory Visit: Payer: Medicaid Other

## 2023-09-30 ENCOUNTER — Ambulatory Visit: Payer: Medicaid Other | Admitting: Oncology

## 2023-09-30 NOTE — Progress Notes (Deleted)
 Advanced Heart Failure Clinic Note   PCP: Merita Norton, FNP (last seen 08/24) Primary Cardiologist: Julien Nordmann, MD (last seen 07/24) HF cardiologist: Arvilla Meres, MD (last seen 09/24)  Chief Complaint:  HPI:  Gabriel [name] is a 53 y/o male with a history of HTN, T2DM, chronic chest pain, chronic troponin elevation, COPD, CKD, asthma, NSVT, OSA, unprovoked PE, pulmonary HTN, psoriasis, migraines, cirrhosis from NASH, ICD placement (10/23), previous tobacco use and chronic heart failure.   Admitted 09/2021 & 11/2021 with HF. Admitted 05/20/22 for ICD placement and discharged the next day. Was in the ED 12/21/22 due to dizziness & chest pain. Was in the ED 06/09/23 due to SOB/ chest pain for previous 2 days. CXR concerning for pneumonia so antibiotics provided. Initial troponin in the 50's which is close to his baseline. EKG without ischemic changes.   Echo 11/28/16: EF of 40-45% along with mild Gabriel. EF has improved from 30-35% July 2017. Echo 04/29/17: EF 45-50%.  Echo 06/26/2019: EF of 30-35% along with moderately elevated PA pressure.  Echo 11/23/19: EF of 25-30% with mild LVH and moderately elevated PA Pressure.  Echo 06/08/20: EF of 35-40% along with mildly elevated PA pressure of 44.7 mmHg, moderate LAE and mild/moderate Gabriel.  Echo 05/21/21: EF of 40-45% along with mild LVH and mild Gabriel. Echo 05/21/21: EF of 40-45% along with mild LVH and mild Gabriel.  Echo 11/24/21: EF of 20-25% along with mild LVH and mild Gabriel.  Echo 02/06/22: EF of 30-35% along with mild/moderate AR.  Echo 08/14/22: EF 40-45% with mild LVH, Grade I DD, severe hypokinesis of left ventricular, entire inferior and inferolateral wall.  Echo 03/08/23: EF 45-50% with moderate LVH, trivial Gabriel, mild dilatation of aortic root at 43 mm  RHC/LHC done 06/13/20 and showed: Severe single-vessel coronary artery disease with chronic total occlusion of the proximal RCA.  The distal vessel fills via left to right collaterals. Mild to moderate,  nonobstructive CAD involving the LAD and LCx with up to 50% stenosis. Mildly elevated left and right heart filling pressures. Moderate pulmonary hypertension (mean PAP 40 mmHg) with significantly elevated pulmonary vascular resistance (PVR 7.4 WU). Severely reduced Fick cardiac output/index.   He presents today for a HF follow-up visit with a chief complaint of moderate shortness of breath with minimal exertion. Chronic in nature. Has shortness of breath, chronic chest pain, chronic dizziness, HA (last 2-3 months), productive cough and intermittent abdominal distention along with this. Denies palpitations or difficulty sleeping. Weight at home has ranged from 245-250 pounds.Generally takes 40mg  torsemide daily with additional 40mg  PRN but says that he gets terrible leg cramps when he takes the 80mg  dose.   Has been out of farxiga for ~ 3 weeks as he needed refills. Says that was told a refill was sent but when he went to [facility] to get it, he was told they hadn't gotten a RX. Explained that it showed a confirmation from [facility] that the farxiga had been received last week.   Sees pulm tomorrow. Does wear CPAP usually every night unless he's feeling very congested or coughing. Has upcoming sleep study 07/21/23.  Has had a frontal headache pretty consistently for the last 2-3 months. Intermittent blurry vision with occasional nausea. Bright lights make the headache worse. Had migraines as a child/ young adult and he says that this feels similar to those. Has upcoming PCP appointment later this week.   ROS: All systems negative except as listed in HPI, PMH and Problem List.  SH:  Social History   Socioeconomic History   Marital status: Widowed    Spouse name: Not on file   Number of children: 1   Years of education: 85   Highest education level: 12th grade  Occupational History   Occupation: disabled  Tobacco Use   Smoking status: Former    Current packs/day: 0.00    Average packs/day: 0.5  packs/day for 33.0 years (16.5 ttl pk-yrs)    Types: Cigarettes    Start date: 05/09/1987    Quit date: 05/08/2020    Years since quitting: 3.3   Smokeless tobacco: Former    Quit date: 05/08/2020   Tobacco comments:    Quit Sept 2021  Vaping Use   Vaping status: Former  Substance and Sexual Activity   Alcohol use: Not Currently   Drug use: No   Sexual activity: Not Currently  Other Topics Concern   Not on file  Social History Narrative   Lives at home with wife and his dad's wife.        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    Social Drivers of Health   Financial Resource Strain: Medium Risk (08/05/2022)   Overall Financial Resource Strain (CARDIA)    Difficulty of Paying Living Expenses: Somewhat hard  Food Insecurity: No Food Insecurity (03/08/2023)   Hunger Vital Sign    Worried About Running Out of Food in the Last Year: Never true    Ran Out of Food in the Last Year: Never true  Transportation Needs: No Transportation Needs (03/08/2023)   PRAPARE - Administrator, Civil Service (Medical): No    Lack of Transportation (Non-Medical): No  Physical Activity: Insufficiently Active (01/20/2018)   Exercise Vital Sign    Days of Exercise per Week: 7 days    Minutes of Exercise per Session: 10 min  Stress: Stress Concern Present (09/02/2017)   [name] of Occupational Health - Occupational Stress Questionnaire    Feeling of Stress : Very much  Social Connections: Moderately Isolated (09/02/2017)   Social Connection and Isolation Panel [NHANES]    Frequency of Communication with Friends and Family: Twice a week    Frequency of Social Gatherings with Friends and Family: Once a week    Attends Religious Services: Never    Database administrator or Organizations: No    Attends Banker Meetings: Never    Marital Status: Widowed  Intimate Partner Violence: Not At Risk (03/08/2023)    Humiliation, Afraid, Rape, and Kick questionnaire    Fear of Current or Ex-Partner: No    Emotionally Abused: No    Physically Abused: No    Sexually Abused: No    FH:  Family History  Problem Relation Age of Onset   Diabetes Mother    Diabetes Mellitus II Mother    Hypothyroidism Mother    Hypertension Mother    Kidney failure Mother        Dialysis   Heart attack Mother        32 yo approximately   Hypertension Father    Gout Father    Cancer Maternal Grandfather    Diabetes Maternal Grandfather    Cancer Paternal Aunt     Past Medical History:  Diagnosis Date   Aortic root dilation (HCC) 11/28/2016   a.) TTE 11/28/2016: 43 mm; b.) TTE 11/23/2019: 38 mm; c.) TTE 08/14/2022: 40 mm; d.) TTE 03/08/2023: 43 mm  Arteriosclerosis of right carotid artery    a.) carotid doppler 04/29/2017: <50% RICA   Bronchitis 07/2023   CAD (coronary artery disease)    a.) MV 04/2015: low risk; b.) LHC 01/06/2017: minor irregs in LAD/Diag/LCX/OM, RCA 40p/m/d; c.) R/LHC 06/13/2020: 50% dLAD, 30% p-mLCx, 40% OM1, 100% pRCA (L-R collats) - med mgmt; d.) R/LHC 08/19/2023: 50% dLAD, 30% p-mLCx, 40% OM1, 100% pRCA (L-R collats), 50% p-mRCA - med mgmt   Chest wall pain, chronic    Chronic 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    Chronic Troponin Elevation    CKD (chronic kidney disease), stage II-III    COPD (chronic obstructive pulmonary disease) (HCC)    Depression    DM (diabetes mellitus), type 2 (HCC)    HFrEF (heart failure with reduced ejection fraction) (HCC)    a.) EF 03/20/2015: EF 45-50%; b.) MV 05/19/2015: EF 30-44%; c.) TTE 01/04/2016: EF 20-25%; d.) TTE 02/29/2016: EF 30-35%; e.) TTE 11/28/2016: EF 40-45%; f.) LHC 01/06/2017: EF 25-35%; g.) TTE 04/29/2017: EF 45-50%; h.) TTE 06/26/2019: EF 30-35%; i.) TTE 11/23/2019: EF 25-30%; j.) TTE 06/08/2020: EF 35-40%; k.) TTE 05/21/2021: EF 40-45%; l.) TTE 08/14/2022: EF 40-45%; m.) TTE 03/08/2023: EF 40-45%   History of 2019 novel coronavirus disease  (COVID-19) 03/01/2022   History of methicillin resistant staphylococcus aureus (MRSA)    Hyperlipidemia    Hypertension    ICD (implantable cardioverter-defibrillator) in place 05/20/2022   a.) LEFT midxillary [employer] Emblem MRI S-ICD (SN: [id number])   Left rotator cuff tear    Long term current use of immunosuppressive drug    a.) ixekizumab for psoriasis/psoriatic arthritis diagnosis   Long-term use of aspirin therapy    Mediastinal lymphadenopathy    Mitral regurgitation    Mild to moderate by October 2021 echocardiogram.   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 12/2015 noted on tele-->amio;  b. 12/2015 Event monitor: no VT noted.   Obesity (BMI 30.0-34.9)    On rivaroxaban therapy    OSA treated with BiPAP    Polyp of descending colon    Psoriasis    Psoriatic arthritis (HCC)    a.) Tx'd with ixekizumab   Pulmonary HTN (HCC) 02/29/2016   a.) TTE 02/29/2016: RVSP 47; b.) TTE 06/26/2019: RVSP 58.4; c.) TTE 12/08/2019: RVSP 40.8; d.) TTE 06/08/2020: RVSP 44.7; e.) R/LHC 06/08/2020: mRA 9, mPA 40, mPWCP 17, AO sat 98, PA sat 61, CO 3.1, CI 1.3, PVR 7.4, LVEDP 20; f.) R/LHC 08/18/2020: mRA 5, mPA 41, mPCWP 18, AO sat 92, PA sat 59, CO 4.3, CI 1.9, PVR 5.3   Recurrent pulmonary emboli (HCC) 06/07/2020   06/07/20: small bilateral PEs.  12/31/19: RUL and RLL PEs.   Stroke [facility])    pt states per mri in 2023-unsure of when stroke happened   Syncope    a. 01/2016 - felt to be vasovagal.   Thrombocytopenia (HCC)     Current Outpatient Medications  Medication Sig Dispense Refill   Accu-Chek Softclix Lancets lancets USE UP TO FOUR TIMES DAILY     albuterol (VENTOLIN HFA) 108 (90 Base)  MCG/ACT inhaler Inhale 2 puffs into the lungs every 4 (four) hours as needed for wheezing or shortness of breath. 8 g  6   allopurinol (ZYLOPRIM) 300 MG tablet Take 300 mg by mouth every morning.     aspirin EC 81 MG tablet Take 81 mg by mouth every morning.     azelastine (ASTELIN) 0.1 % nasal spray Place 2 sprays into both nostrils 2 (two) times daily. Use in each nostril as directed 30 mL 12   BD PEN NEEDLE NANO 2ND GEN 32G X 4 MM MISC USE  AS DIRECTED AT BEDTIME 200 each 0   blood glucose meter kit and supplies KIT Dispense based on patient and insurance preference. Use up to four times daily as directed. 1 each 11   carvedilol (COREG) 6.25 MG tablet Take 1 tablet (6.25 mg total) by mouth 2 (two) times daily.     cetirizine (ZYRTEC) 10 MG tablet Take 1 tablet (10 mg total) by mouth every morning.     colchicine 0.6 MG tablet Take 1 tablet (0.6 mg total) by mouth daily as needed (gout flare).     Continuous Glucose Sensor (FREESTYLE LIBRE 3 SENSOR) MISC PLACE 1 SENSOR ON TO THE SKIN EVERY 14 DAYS. USE TO CHECK BLOOD SUGAR CONTINUEOUSLY.     Continuous Glucose Sensor (FREESTYLE LIBRE 3 SENSOR) MISC 1 Application by Does not apply route every 14 (fourteen) days. Use to check blood sugar continuously. 6 each 0   Continuous Glucose Sensor (FREESTYLE LIBRE 3 SENSOR) MISC Apply sensor every 14 (fourteen) days to check blood sugar continuously. 6 each 0   dapagliflozin propanediol (FARXIGA) 10 MG TABS tablet Take 1 tablet (10 mg total) by mouth daily. 90 tablet 3   fenofibrate (TRICOR) 145 MG tablet Take 1 tablet (145 mg total) by mouth every evening.     FLUoxetine (PROZAC) 20 MG capsule Take 1 capsule (20 mg total) by mouth every evening.     fluticasone (FLONASE) 50 MCG/ACT nasal spray Place 2 sprays into both nostrils daily as needed for allergies or rhinitis. Home med.     Insulin Glargine (BASAGLAR KWIKPEN) 100 UNIT/ML Inject 36 Units into the skin at bedtime. 45 mL 3   insulin lispro (HUMALOG) 100  UNIT/ML injection Inject 10 Units into the skin 3 (three) times daily before meals.     Insulin Pen Needle 32G X 4 MM MISC 1 each by Does not apply route in the morning, at noon, in the evening, and at bedtime. 120 each 11   ipratropium-albuterol (DUONEB) 0.5-2.5 (3) MG/3ML SOLN Take 3 mLs by nebulization every 6 (six) hours as needed. 360 mL 11   isosorbide mononitrate (IMDUR) 30 MG 24 hr tablet Take 0.5 tablets (15 mg total) by mouth every morning.     meclizine (ANTIVERT) 25 MG tablet Take 0.5 tablets (12.5 mg total) by mouth 3 (three) times daily as needed for dizziness.     metoCLOPramide (REGLAN) 5 MG tablet Take 1 tablet (5 mg total) by mouth every 8 (eight) hours as needed for nausea. 30 tablet 0   naltrexone (DEPADE) 50 MG tablet Take 1 tablet (50 mg total) by mouth every morning.     nitroGLYCERIN (NITROSTAT) 0.4 MG SL tablet Place 1 tablet (0.4 mg total) under the tongue every 5 (five) minutes x 3 doses as needed for chest pain. 30 tablet 0   omeprazole (PRILOSEC) 40 MG capsule Take 1 capsule (40 mg total) by mouth every morning.     oxyCODONE (ROXICODONE) 5 MG immediate release tablet Take 1-2 tablets (5-10 mg total) by mouth every 4 (four) hours as needed for moderate pain (  pain score 4-6) or severe pain (pain score 7-10). 40 tablet 0   potassium chloride SA (KLOR-CON M) 20 MEQ tablet Take 1 tablet (20 mEq total) by mouth every other day. AM     Resmetirom (REZDIFFRA) 100 MG TABS Take 1 tablet by mouth at bedtime.     rivaroxaban (XARELTO) 20 MG TABS tablet Take 1 tablet (20 mg total) by mouth daily with supper. 90 tablet 3   rosuvastatin (CRESTOR) 20 MG tablet Take 1 tablet (20 mg total) by mouth at bedtime.     sacubitril-valsartan (ENTRESTO) 97-103 MG Take 1 tablet by mouth 2 (two) times daily.     Semaglutide, 2 MG/DOSE, (OZEMPIC, 2 MG/DOSE,) 8 MG/3ML SOPN Inject 2 mg into the skin once a week. Tuesdays     spironolactone (ALDACTONE) 25 MG tablet Take 1 tablet by mouth once daily 90  tablet 1   TALTZ 80 MG/ML SOAJ Inject 3 mLs into the skin every 28 (twenty-eight) days.     torsemide (DEMADEX) 20 MG tablet Take 1 tablet (20 mg total) by mouth every morning.     umeclidinium-vilanterol (ANORO ELLIPTA) 62.5-25 MCG/ACT AEPB Inhale 1 puff into the lungs every morning.     No current facility-administered medications for this visit.   There were no vitals filed for this visit.  Wt Readings from Last 3 Encounters:  09/29/23 247 lb 12.8 oz (112.4 kg)  09/24/23 248 lb 3.2 oz (112.6 kg)  09/16/23 244 lb 12.8 oz (111 kg)   Lab Results  Component Value Date   CREATININE 1.20 09/29/2023   CREATININE 1.86 (H) 08/29/2023   CREATININE 1.48 (H) 08/08/2023   PHYSICAL EXAM:  General:  Well appearing although uncomfortable due to headache. No resp difficulty HEENT: normal Neck: supple. JVP flat. No lymphadenopathy or thryomegaly appreciated. Cor: PMI normal. Regular rate & rhythm. No rubs, gallops or murmurs. Lungs: clear Abdomen: soft, nontender, nondistended. No hepatosplenomegaly. No bruits or masses.  Extremities: no cyanosis, clubbing, rash, edema Neuro: alert & oriented x3, cranial nerves grossly intact. Moves all 4 extremities w/o difficulty. Affect pleasant.   ECG: not done  Reds: unable to obtain reading due to "low volume"; same message both times   ASSESSMENT & PLAN:  1.  Chronic HFrEF/Mixed Ischemic and nonischemic cardiomyopathy:   - Mixed ICM/NICM - NYHA class III - s/p [employer]q-ICD  - weighing daily; reminded to call for an overnight weight gain of > 2 pounds or a weekly weight gain of > 5 pounds - weight up 2 pounds from last visit here 2 months ago - Echo 11/28/16: EF of 40-45% along with mild Gabriel. EF has improved from 30-35% July 2017. - Echo 04/29/17: EF 45-50%.  - Echo 06/26/2019: EF of 30-35% along with moderately elevated PA pressure.  - Echo 11/23/19: EF of 25-30% with mild LVH and moderately elevated PA Pressure.  - Echo 06/08/20: EF of 35-40%  along with mildly elevated PA pressure of 44.7 mmHg, moderate LAE and mild/moderate Gabriel.  - Echo 05/21/21: EF of 40-45% along with mild LVH and mild Gabriel. - Echo 05/21/21: EF of 40-45% along with mild LVH and mild Gabriel.  - Echo 11/24/21: EF of 20-25% along with mild LVH and mild Gabriel.  - Echo 02/06/22: EF of 30-35% along with mild/moderate AR.  - Echo 08/14/22: EF 40-45% with mild LVH, Grade I DD, severe hypokinesis of left ventricular, entire inferior and inferolateral wall.  - Echo 03/08/23: EF 45-50% with moderate LVH, trivial Gabriel, mild dilatation  of aortic root at 43 mm - continue Entresto 97/103 bid - resume [place] 10mg  daily - continue spiro 25 daily - continue carvedilol 12.5mg  BID - continue torsemide 40mg  daily with additional 40mg  PRN - continue potassium QOD - continue to use Furoscix if weight >= 250 - BMET today - consider Cardiomems to help volume management as needed -> his insurance won't cover - saw EP Lalla Brothers) 01/24 - ICD placed on 05/20/22 - saw [facility] provider (Bensimhon) 09/24 - BNP 06/09/23 was 519.3   2.  CAD/Elevated Troponin and chronic CP:  - H/o CAD  - RHC/LHC done 10/21 and showed:  Severe single-vessel coronary artery disease with chronic total occlusion of the proximal RCA.  The distal vessel fills via left to right collaterals.  Mild to moderate, nonobstructive CAD involving the LAD and LCx with up to 50% stenosis.  Mildly elevated left and right heart filling pressures.  Moderate pulmonary hypertension (mean PAP 40 mmHg) with significantly elevated pulmonary vascular resistance (PVR 7.4 WU).  Severely reduced Fick cardiac output/index. - Stable angina - continue rosuvastatin 20mg  daily - saw cardiology Mariah Milling) 07/24   3.  HTN:  - BP  - saw PCP Suzie Portela) 08/24 - BMP 06/16/23 showed sodium 135, potassium 4.5, creatinine 1.41 & GFR 59 - recheck BMET today   4.  H/o PE/Chronic anticoagulation:  - continue xarelto 20mg  daily - no recent bleeding   5.   DM2- - saw nephrology (Korrapati) 08/23  - A1c 04/16/23 was 6.4%   6. OSA- - Sleep study 3/22: AHI 28  - sleep study 12/23; AHI 45.1 - wearing CPAP nightly unless feeling very congested - saw pulmonology (Dgayli) 09/24; returns tomorrow  7: COPD- - stopped smoking 2021 - had smoked for close to 40 years with starting at the age of 34 - at his peak, he was smoking 2-2.5 ppd - saw pulmonology (Dgayli) 09/24  8: Dizziness/ HA- - meclizine helping some but dizziness continues - saw neurology Marlene Bast) 10/24 - thyroid panel 01/17/23 was normal - discuss HA further with PCP later this week   Return in 3 months, sooner if needed.        Delma Freeze, FNP 09/30/23

## 2023-09-30 NOTE — Progress Notes (Signed)
Remote ICD transmission.

## 2023-09-30 NOTE — Addendum Note (Signed)
Addended by: Geralyn Flash D on: 09/30/2023 11:03 AM   Modules accepted: Orders

## 2023-10-01 ENCOUNTER — Telehealth: Payer: Self-pay | Admitting: Internal Medicine

## 2023-10-01 NOTE — Telephone Encounter (Signed)
Pt confirmed appt for 10/02/23

## 2023-10-02 ENCOUNTER — Encounter: Payer: Medicaid Other | Admitting: Internal Medicine

## 2023-10-03 ENCOUNTER — Telehealth: Payer: Self-pay | Admitting: Family

## 2023-10-03 NOTE — Telephone Encounter (Signed)
Pt confirmed appt for 10/06/23

## 2023-10-06 ENCOUNTER — Telehealth: Payer: Self-pay | Admitting: Family

## 2023-10-06 ENCOUNTER — Encounter: Payer: Medicaid Other | Admitting: Family

## 2023-10-06 DIAGNOSIS — Z9889 Other specified postprocedural states: Secondary | ICD-10-CM | POA: Diagnosis not present

## 2023-10-06 DIAGNOSIS — M6281 Muscle weakness (generalized): Secondary | ICD-10-CM | POA: Diagnosis not present

## 2023-10-06 DIAGNOSIS — M25512 Pain in left shoulder: Secondary | ICD-10-CM | POA: Diagnosis not present

## 2023-10-06 DIAGNOSIS — G8929 Other chronic pain: Secondary | ICD-10-CM | POA: Diagnosis not present

## 2023-10-06 DIAGNOSIS — M25612 Stiffness of left shoulder, not elsewhere classified: Secondary | ICD-10-CM | POA: Diagnosis not present

## 2023-10-06 NOTE — Telephone Encounter (Signed)
 Pt confirmed appt for 10/07/23

## 2023-10-06 NOTE — Progress Notes (Unsigned)
Advanced Heart Failure Clinic Note   PCP: Merita Norton, FNP (last seen 08/24) Primary Cardiologist: Julien Nordmann, MD (last seen 07/24; returns 05/25) HF cardiologist: Arvilla Meres, MD (last seen 09/24; returns 03/25)  Chief Complaint: shortness of breath  HPI:  Gabriel [name] is a 53 y/o male with a history of HTN, T2DM, chronic chest pain, chronic troponin elevation, COPD, CKD, asthma, NSVT, OSA, unprovoked PE, pulmonary HTN, psoriasis, migraines, cirrhosis from NASH, ICD placement (10/23), previous tobacco use and chronic heart failure. Had left shoulder arthroscopy with debridement, decompression, rotator cuff repair and biceps tenodesis done 09/16/23.   Admitted 09/2021 & 11/2021 with HF. Admitted 05/20/22 for ICD placement and discharged the next day. Was in the ED 12/21/22 due to dizziness & chest pain. Was in the ED 06/09/23 due to SOB/ chest pain for previous 2 days. CXR concerning for pneumonia so antibiotics provided. Initial troponin in the 50's which is close to his baseline. EKG without ischemic changes.   Echo 05/21/21: EF of 40-45% along with mild LVH and mild Gabriel.  Echo 11/24/21: EF of 20-25% along with mild LVH and mild Gabriel.  Echo 02/06/22: EF of 30-35% along with mild/moderate AR.  Echo 08/14/22: EF 40-45% with mild LVH, Grade I DD, severe hypokinesis of left ventricular, entire inferior and inferolateral wall.  Echo 03/08/23: EF 45-50% with moderate LVH, trivial Gabriel, mild dilatation of aortic root at 43 mm  RHC/LHC done 06/13/20 and showed: Severe single-vessel coronary artery disease with chronic total occlusion of the proximal RCA.  The distal vessel fills via left to right collaterals. Mild to moderate, nonobstructive CAD involving the LAD and LCx with up to 50% stenosis. Mildly elevated left and right heart filling pressures. Moderate pulmonary hypertension (mean PAP 40 mmHg) with significantly elevated pulmonary vascular resistance (PVR 7.4 WU). Severely reduced Fick cardiac  output/index.  RHC/ LHC 08/19/23:  Relatively stable appearance of the coronary arteries since last catheterization in 2021.  Chronically occluded proximal RCA now has antegrade flow, though I suspect this represents bridging collaterals.  Left-to-right collateral remain as well as moderate LAD and LCx disease. Mildly elevated left heart filling pressures (LVEDP 20 mmHg, PCWP 18 mmHg). Moderate pulmonary hypertension (mean PA 41 mmHg, PVR 5.3 WU). Moderately reduced Fick cardiac output/index (CO 4.3 L/min, CI 1.9 L/min/m^2).   He presents today for a HF follow-up visit with a chief complaint of worsening moderate shortness of breath due to cold weather. Has associated fatigue, palpitations, occasional dizziness and trouble sleeping due to shoulder pain from his recent surgery. Denies chest pain, abdominal distention, pedal edema or weight gain. He is currently wearing a zio monitor. Getting weekly PT on his shoulder right now and wearing a sling.     Has nebulizer but it's cracked & he's wanting to know if we can send in RX just for the machine to [facility].   At last visit, farxiga 10mg  was restarted. Does wear CPAP usually every night unless he's feeling very congested or coughing. Had sleep study 11/24.   ROS: All systems negative except as listed in HPI, PMH and Problem List.  SH:  Social History   Socioeconomic History   Marital status: Widowed    Spouse name: Not on file   Number of children: 1   Years of education: 55   Highest education level: 12th grade  Occupational History   Occupation: disabled  Tobacco Use   Smoking status: Former    Current packs/day: 0.00    Average packs/day: 0.5  packs/day for 33.0 years (16.5 ttl pk-yrs)    Types: Cigarettes    Start date: 05/09/1987    Quit date: 05/08/2020    Years since quitting: 3.4   Smokeless tobacco: Former    Quit date: 05/08/2020   Tobacco comments:    Quit Sept 2021  Vaping Use   Vaping status: Former  Substance  and Sexual Activity   Alcohol use: Not Currently   Drug use: No   Sexual activity: Not Currently  Other Topics Concern   Not on file  Social History Narrative   Lives at home with wife and his dad's wife.        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    Social Drivers of Health   Financial Resource Strain: Medium Risk (08/05/2022)   Overall Financial Resource Strain (CARDIA)    Difficulty of Paying Living Expenses: Somewhat hard  Food Insecurity: No Food Insecurity (03/08/2023)   Hunger Vital Sign    Worried About Running Out of Food in the Last Year: Never true    Ran Out of Food in the Last Year: Never true  Transportation Needs: No Transportation Needs (03/08/2023)   PRAPARE - Administrator, Civil Service (Medical): No    Lack of Transportation (Non-Medical): No  Physical Activity: Insufficiently Active (01/20/2018)   Exercise Vital Sign    Days of Exercise per Week: 7 days    Minutes of Exercise per Session: 10 min  Stress: Stress Concern Present (09/02/2017)   [name] of Occupational Health - Occupational Stress Questionnaire    Feeling of Stress : Very much  Social Connections: Moderately Isolated (09/02/2017)   Social Connection and Isolation Panel [NHANES]    Frequency of Communication with Friends and Family: Twice a week    Frequency of Social Gatherings with Friends and Family: Once a week    Attends Religious Services: Never    Database administrator or Organizations: No    Attends Banker Meetings: Never    Marital Status: Widowed  Intimate Partner Violence: Not At Risk (03/08/2023)   Humiliation, Afraid, Rape, and Kick questionnaire    Fear of Current or Ex-Partner: No    Emotionally Abused: No    Physically Abused: No    Sexually Abused: No    FH:  Family History  Problem Relation Age of Onset   Diabetes Mother    Diabetes Mellitus II Mother     Hypothyroidism Mother    Hypertension Mother    Kidney failure Mother        Dialysis   Heart attack Mother        54 yo approximately   Hypertension Father    Gout Father    Cancer Maternal Grandfather    Diabetes Maternal Grandfather    Cancer Paternal Aunt     Past Medical History:  Diagnosis Date   Aortic root dilation (HCC) 11/28/2016   a.) TTE 11/28/2016: 43 mm; b.) TTE 11/23/2019: 38 mm; c.) TTE 08/14/2022: 40 mm; d.) TTE 03/08/2023: 43 mm   Arteriosclerosis of right carotid artery    a.) carotid doppler 04/29/2017: <50% RICA   Bronchitis 07/2023   CAD (coronary artery disease)    a.) MV 04/2015: low risk; b.) LHC 01/06/2017: minor irregs in LAD/Diag/LCX/OM, RCA 40p/m/d; c.) R/LHC 06/13/2020: 50% dLAD, 30% p-mLCx, 40% OM1, 100% pRCA (L-R collats) - med mgmt; d.) R/LHC 08/19/2023: 50% dLAD, 30% p-mLCx,  40% OM1, 100% pRCA (L-R collats), 50% p-mRCA - med mgmt   Chest wall pain, chronic    Chronic 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    Chronic Troponin Elevation    CKD (chronic kidney disease), stage II-III    COPD (chronic obstructive pulmonary disease) (HCC)    Depression    DM (diabetes mellitus), type 2 (HCC)    HFrEF (heart failure with reduced ejection fraction) (HCC)    a.) EF 03/20/2015: EF 45-50%; b.) MV 05/19/2015: EF 30-44%; c.) TTE 01/04/2016: EF 20-25%; d.) TTE 02/29/2016: EF 30-35%; e.) TTE 11/28/2016: EF 40-45%; f.) LHC 01/06/2017: EF 25-35%; g.) TTE 04/29/2017: EF 45-50%; h.) TTE 06/26/2019: EF 30-35%; i.) TTE 11/23/2019: EF 25-30%; j.) TTE 06/08/2020: EF 35-40%; k.) TTE 05/21/2021: EF 40-45%; l.) TTE 08/14/2022: EF 40-45%; m.) TTE 03/08/2023: EF 40-45%   History of 2019 novel coronavirus disease (COVID-19) 03/01/2022   History of methicillin resistant staphylococcus aureus (MRSA)    Hyperlipidemia    Hypertension    ICD (implantable cardioverter-defibrillator) in place 05/20/2022   a.) LEFT midxillary [employer] Emblem MRI S-ICD (SN: [phone])   Left rotator  cuff tear    Long term current use of immunosuppressive drug    a.) ixekizumab for psoriasis/psoriatic arthritis diagnosis   Long-term use of aspirin therapy    Mediastinal lymphadenopathy    Mitral regurgitation    Mild to moderate by October 2021 echocardiogram.   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 12/2015 noted on tele-->amio;  b. 12/2015 Event monitor: no VT noted.   Obesity (BMI 30.0-34.9)    On rivaroxaban therapy    OSA treated with BiPAP    Polyp of descending colon    Psoriasis    Psoriatic arthritis (HCC)    a.) Tx'd with ixekizumab   Pulmonary HTN (HCC) 02/29/2016   a.) TTE 02/29/2016: RVSP 47; b.) TTE 06/26/2019: RVSP 58.4; c.) TTE 12/08/2019: RVSP 40.8; d.) TTE 06/08/2020: RVSP 44.7; e.) R/LHC 06/08/2020: mRA 9, mPA 40, mPWCP 17, AO sat 98, PA sat 61, CO 3.1, CI 1.3, PVR 7.4, LVEDP 20; f.) R/LHC 08/18/2020: mRA 5, mPA 41, mPCWP 18, AO sat 92, PA sat 59, CO 4.3, CI 1.9, PVR 5.3   Recurrent pulmonary emboli (HCC) 06/07/2020   06/07/20: small bilateral PEs.  12/31/19: RUL and RLL PEs.   Stroke [facility])    pt states per mri in 2023-unsure of when stroke happened   Syncope    a. 01/2016 - felt to be vasovagal.   Thrombocytopenia (HCC)     Current Outpatient Medications  Medication Sig Dispense Refill   Accu-Chek Softclix Lancets lancets USE UP TO FOUR TIMES DAILY     albuterol (VENTOLIN HFA) 108 (90 Base) MCG/ACT inhaler Inhale 2 puffs into the lungs every 4 (four) hours as needed for wheezing or shortness of breath. 8 g 6   allopurinol (ZYLOPRIM) 300 MG tablet Take 300 mg by mouth every morning.     aspirin EC 81 MG tablet Take 81 mg by mouth every morning.     azelastine  (ASTELIN) 0.1 % nasal spray Place 2 sprays into both nostrils 2 (two) times daily. Use in each nostril as directed 30 mL 12   BD PEN  NEEDLE NANO 2ND GEN 32G X 4 MM MISC USE  AS DIRECTED AT BEDTIME 200 each 0   blood glucose meter kit and supplies KIT Dispense based on patient and insurance preference. Use up to four times daily as directed. 1 each 11   carvedilol (COREG) 6.25 MG tablet Take 1 tablet (6.25 mg total) by mouth 2 (two) times daily.     cetirizine (ZYRTEC) 10 MG tablet Take 1 tablet (10 mg total) by mouth every morning.     colchicine 0.6 MG tablet Take 1 tablet (0.6 mg total) by mouth daily as needed (gout flare).     Continuous Glucose Sensor (FREESTYLE LIBRE 3 SENSOR) MISC PLACE 1 SENSOR ON TO THE SKIN EVERY 14 DAYS. USE TO CHECK BLOOD SUGAR CONTINUEOUSLY.     Continuous Glucose Sensor (FREESTYLE LIBRE 3 SENSOR) MISC 1 Application by Does not apply route every 14 (fourteen) days. Use to check blood sugar continuously. 6 each 0   Continuous Glucose Sensor (FREESTYLE LIBRE 3 SENSOR) MISC Apply sensor every 14 (fourteen) days to check blood sugar continuously. 6 each 0   dapagliflozin propanediol (FARXIGA) 10 MG TABS tablet Take 1 tablet (10 mg total) by mouth daily. 90 tablet 3   fenofibrate (TRICOR) 145 MG tablet Take 1 tablet (145 mg total) by mouth every evening.     FLUoxetine (PROZAC) 20 MG capsule Take 1 capsule (20 mg total) by mouth every evening.     fluticasone (FLONASE) 50 MCG/ACT nasal spray Place 2 sprays into both nostrils daily as needed for allergies or rhinitis. Home med.     Insulin Glargine (BASAGLAR KWIKPEN) 100 UNIT/ML Inject 36 Units into the skin at bedtime. 45 mL 3   insulin lispro (HUMALOG) 100 UNIT/ML injection Inject 10 Units into the skin 3 (three) times daily before meals.     Insulin Pen Needle 32G X 4 MM MISC 1 each by Does not apply route in the morning, at noon, in the evening, and at bedtime. 120 each 11   ipratropium-albuterol (DUONEB) 0.5-2.5 (3)  MG/3ML SOLN Take 3 mLs by nebulization every 6 (six) hours as needed. 360 mL 11   isosorbide mononitrate (IMDUR) 30 MG 24 hr tablet Take 0.5 tablets (15 mg total) by mouth every morning.     meclizine (ANTIVERT) 25 MG tablet Take 0.5 tablets (12.5 mg total) by mouth 3 (three) times daily as needed for dizziness.     metoCLOPramide (REGLAN) 5 MG tablet Take 1 tablet (5 mg total) by mouth every 8 (eight) hours as needed for nausea. 30 tablet 0   naltrexone (DEPADE) 50 MG tablet Take 1 tablet (50 mg total) by mouth every morning.     nitroGLYCERIN (NITROSTAT) 0.4 MG SL tablet Place 1 tablet (0.4 mg total) under the tongue every 5 (five) minutes x 3 doses as needed for chest pain. 30 tablet 0   omeprazole (PRILOSEC) 40 MG capsule Take 1 capsule (40 mg total) by mouth every morning.     oxyCODONE (ROXICODONE) 5 MG immediate release tablet Take 1-2 tablets (5-10 mg total) by mouth every 4 (four) hours as needed for moderate pain (pain score 4-6) or severe pain (pain score 7-10). 40 tablet 0   potassium chloride SA (KLOR-CON M) 20 MEQ tablet Take 1 tablet (20 mEq total) by mouth every other day. AM     Resmetirom (REZDIFFRA) 100 MG TABS Take 1 tablet by mouth at bedtime.     rivaroxaban (XARELTO) 20 MG TABS tablet Take 1 tablet (20  mg total) by mouth daily with supper. 90 tablet 3   rosuvastatin (CRESTOR) 20 MG tablet Take 1 tablet (20 mg total) by mouth at bedtime.     sacubitril-valsartan (ENTRESTO) 97-103 MG Take 1 tablet by mouth 2 (two) times daily.     Semaglutide, 2 MG/DOSE, (OZEMPIC, 2 MG/DOSE,) 8 MG/3ML SOPN Inject 2 mg into the skin once a week. Tuesdays     spironolactone (ALDACTONE) 25 MG tablet Take 1 tablet by mouth once daily 90 tablet 1   TALTZ 80 MG/ML SOAJ Inject 3 mLs into the skin every 28 (twenty-eight) days.     torsemide (DEMADEX) 20 MG tablet Take 1 tablet (20 mg total) by mouth every morning.     umeclidinium-vilanterol (ANORO ELLIPTA) 62.5-25 MCG/ACT AEPB Inhale 1 puff into the  lungs every morning.     No current facility-administered medications for this visit.   Vitals:   10/07/23 0847  BP: (!) 115/56  Pulse: 96  SpO2: 98%  Weight: 245 lb (111.1 kg)   Wt Readings from Last 3 Encounters:  10/07/23 245 lb (111.1 kg)  09/29/23 247 lb 12.8 oz (112.4 kg)  09/24/23 248 lb 3.2 oz (112.6 kg)   Lab Results  Component Value Date   CREATININE 1.20 09/29/2023   CREATININE 1.86 (H) 08/29/2023   CREATININE 1.48 (H) 08/08/2023    PHYSICAL EXAM:  General: Well appearing. No resp difficulty HEENT: normal Neck: supple, no JVD Cor: Regular rhythm, rate. No rubs, gallops or murmurs Lungs: clear Abdomen: soft, nontender, nondistended. Extremities: no cyanosis, clubbing, rash, edema; left arm in a sling due to shoulder surgery Neuro: alert & oriented X 3. Moves all 4 extremities w/o difficulty. Affect pleasant   ECG: not done    ASSESSMENT & PLAN:  1.  Chronic HFrEF/Mixed Ischemic and nonischemic cardiomyopathy:   - Mixed ICM/NICM - NYHA class III - s/p [employer] Sq-ICD  - weighing daily; reminded to call for an overnight weight gain of > 2 pounds or a weekly weight gain of > 5 pounds - weight down 7 pounds from last visit here 3 months ago - Echo 05/21/21: EF of 40-45% along with mild LVH and mild Gabriel.  - Echo 11/24/21: EF of 20-25% along with mild LVH and mild Gabriel.  - Echo 02/06/22: EF of 30-35% along with mild/moderate AR.  - Echo 08/14/22: EF 40-45% with mild LVH, Grade I DD, severe hypokinesis of left ventricular, entire inferior and inferolateral wall.  - Echo 03/08/23: EF 45-50% with moderate LVH, trivial Gabriel, mild dilatation of aortic root at 43 mm - due to rising HR, increase carvedilol to 12.5mg  BID - continue [place] 10mg  daily - continue Entresto 97/103 bid - continue spiro 25 daily - continue torsemide 20mg  daily with additional 40mg  PRN - continue potassium QOD - continue to use Furoscix if weight >= 250; has not used in "awhile" -  consider Cardiomems to help volume management as needed -> his insurance won't cover - saw EP (Riddle) 02/25 - ICD placed on 05/20/22 - saw [facility] provider (Bensimhon) 09/24 - BNP 07/22/23 reviewed and was 454.3   2.  CAD/Elevated Troponin and chronic CP:  - H/o CAD  -  RHC/ LHC 08/19/23:     1. Relatively stable appearance of the coronary arteries since last catheterization in 2021.  Chronically occluded proximal RCA now has antegrade flow, though I suspect this represents bridging collaterals.  Left-to-right collateral remain as well as moderate LAD and LCx disease.    2.  Mildly elevated left heart filling pressures (LVEDP 20 mmHg, PCWP 18 mmHg).    3. Moderate pulmonary hypertension (mean PA 41 mmHg, PVR 5.3 WU).    4. Moderately reduced Fick cardiac output/index (CO 4.3 L/min, CI 1.9 L/min/m^2). - continue rosuvastatin 20mg  daily - continue isosorbide MN 15mg  daily - saw cardiology Fransico Michael) 01/25   3.  HTN:  - BP 115/56 - saw PCP Ephriam Knuckles) 12/24 - BMP 09/29/23 reviewed and showed sodium 138, potassium 3.9, creatinine 1.2 & GFR >60   4.  H/o PE/Chronic anticoagulation:  - continue xarelto 20mg  daily - no recent bleeding   5.  DM2- - saw nephrology (Korrapati) 08/23  - A1c 07/11/23 reviewed and was 6.6%   6. OSA- - Sleep study 3/22: AHI 28  - sleep study 12/23; AHI 45.1 - sleep study 11/24 AHI 70.0 - wearing CPAP nightly unless feeling very congested - saw pulmonology Wynona Neat) 11/24  7: COPD- - stopped smoking 2021 - had smoked for close to 40 years with starting at the age of 12 - at his peak, he was smoking 2-2.5 ppd - saw pulmonology Wynona Neat) 11/24 - RX for nebulizer machine faxed to [facility]   Return next month, sooner if needed.   Delma Freeze, FNP 10/06/23

## 2023-10-07 ENCOUNTER — Telehealth: Payer: Self-pay | Admitting: Family

## 2023-10-07 ENCOUNTER — Encounter: Payer: Self-pay | Admitting: Family

## 2023-10-07 ENCOUNTER — Ambulatory Visit: Payer: Medicaid Other | Attending: Family | Admitting: Family

## 2023-10-07 VITALS — BP 115/56 | HR 96 | Wt 245.0 lb

## 2023-10-07 DIAGNOSIS — I428 Other cardiomyopathies: Secondary | ICD-10-CM | POA: Diagnosis not present

## 2023-10-07 DIAGNOSIS — Z7985 Long-term (current) use of injectable non-insulin antidiabetic drugs: Secondary | ICD-10-CM | POA: Diagnosis not present

## 2023-10-07 DIAGNOSIS — I5022 Chronic systolic (congestive) heart failure: Secondary | ICD-10-CM | POA: Insufficient documentation

## 2023-10-07 DIAGNOSIS — N189 Chronic kidney disease, unspecified: Secondary | ICD-10-CM | POA: Insufficient documentation

## 2023-10-07 DIAGNOSIS — J449 Chronic obstructive pulmonary disease, unspecified: Secondary | ICD-10-CM | POA: Diagnosis not present

## 2023-10-07 DIAGNOSIS — I130 Hypertensive heart and chronic kidney disease with heart failure and stage 1 through stage 4 chronic kidney disease, or unspecified chronic kidney disease: Secondary | ICD-10-CM | POA: Diagnosis not present

## 2023-10-07 DIAGNOSIS — R0602 Shortness of breath: Secondary | ICD-10-CM | POA: Insufficient documentation

## 2023-10-07 DIAGNOSIS — Z87891 Personal history of nicotine dependence: Secondary | ICD-10-CM | POA: Insufficient documentation

## 2023-10-07 DIAGNOSIS — Z86711 Personal history of pulmonary embolism: Secondary | ICD-10-CM | POA: Insufficient documentation

## 2023-10-07 DIAGNOSIS — Z7901 Long term (current) use of anticoagulants: Secondary | ICD-10-CM | POA: Diagnosis not present

## 2023-10-07 DIAGNOSIS — I2699 Other pulmonary embolism without acute cor pulmonale: Secondary | ICD-10-CM

## 2023-10-07 DIAGNOSIS — E1122 Type 2 diabetes mellitus with diabetic chronic kidney disease: Secondary | ICD-10-CM | POA: Insufficient documentation

## 2023-10-07 DIAGNOSIS — I255 Ischemic cardiomyopathy: Secondary | ICD-10-CM | POA: Insufficient documentation

## 2023-10-07 DIAGNOSIS — G4733 Obstructive sleep apnea (adult) (pediatric): Secondary | ICD-10-CM | POA: Insufficient documentation

## 2023-10-07 DIAGNOSIS — K7581 Nonalcoholic steatohepatitis (NASH): Secondary | ICD-10-CM | POA: Insufficient documentation

## 2023-10-07 DIAGNOSIS — N1832 Chronic kidney disease, stage 3b: Secondary | ICD-10-CM

## 2023-10-07 DIAGNOSIS — Z794 Long term (current) use of insulin: Secondary | ICD-10-CM | POA: Insufficient documentation

## 2023-10-07 DIAGNOSIS — I10 Essential (primary) hypertension: Secondary | ICD-10-CM | POA: Diagnosis not present

## 2023-10-07 DIAGNOSIS — R002 Palpitations: Secondary | ICD-10-CM | POA: Insufficient documentation

## 2023-10-07 DIAGNOSIS — M25519 Pain in unspecified shoulder: Secondary | ICD-10-CM | POA: Diagnosis not present

## 2023-10-07 DIAGNOSIS — K7460 Unspecified cirrhosis of liver: Secondary | ICD-10-CM | POA: Insufficient documentation

## 2023-10-07 DIAGNOSIS — J4489 Other specified chronic obstructive pulmonary disease: Secondary | ICD-10-CM | POA: Diagnosis not present

## 2023-10-07 DIAGNOSIS — Z95810 Presence of automatic (implantable) cardiac defibrillator: Secondary | ICD-10-CM | POA: Insufficient documentation

## 2023-10-07 DIAGNOSIS — L409 Psoriasis, unspecified: Secondary | ICD-10-CM | POA: Diagnosis not present

## 2023-10-07 DIAGNOSIS — Z79899 Other long term (current) drug therapy: Secondary | ICD-10-CM | POA: Diagnosis not present

## 2023-10-07 DIAGNOSIS — I2510 Atherosclerotic heart disease of native coronary artery without angina pectoris: Secondary | ICD-10-CM | POA: Diagnosis not present

## 2023-10-07 DIAGNOSIS — R42 Dizziness and giddiness: Secondary | ICD-10-CM | POA: Diagnosis not present

## 2023-10-07 DIAGNOSIS — Z7984 Long term (current) use of oral hypoglycemic drugs: Secondary | ICD-10-CM | POA: Insufficient documentation

## 2023-10-07 DIAGNOSIS — I272 Pulmonary hypertension, unspecified: Secondary | ICD-10-CM | POA: Diagnosis not present

## 2023-10-07 MED ORDER — CARVEDILOL 12.5 MG PO TABS: 12.5000 mg | ORAL_TABLET | Freq: Two times a day (BID) | ORAL | 3 refills | Status: DC

## 2023-10-07 NOTE — Patient Instructions (Addendum)
INCREASE YOUR CARVEDILOL TO 12.5 MG TWICE DAILY  WE'VE SENT YOU A NEW RX BUT YOU CAN DOUBLE UP ON YOUR CURRENT PRESCRIPTION IF YOU PREFER

## 2023-10-10 DIAGNOSIS — G4733 Obstructive sleep apnea (adult) (pediatric): Secondary | ICD-10-CM | POA: Diagnosis not present

## 2023-10-10 DIAGNOSIS — R0683 Snoring: Secondary | ICD-10-CM | POA: Diagnosis not present

## 2023-10-10 DIAGNOSIS — J449 Chronic obstructive pulmonary disease, unspecified: Secondary | ICD-10-CM | POA: Diagnosis not present

## 2023-10-13 DIAGNOSIS — M25512 Pain in left shoulder: Secondary | ICD-10-CM | POA: Diagnosis not present

## 2023-10-13 DIAGNOSIS — Z9889 Other specified postprocedural states: Secondary | ICD-10-CM | POA: Diagnosis not present

## 2023-10-13 DIAGNOSIS — M6281 Muscle weakness (generalized): Secondary | ICD-10-CM | POA: Diagnosis not present

## 2023-10-13 DIAGNOSIS — M25612 Stiffness of left shoulder, not elsewhere classified: Secondary | ICD-10-CM | POA: Diagnosis not present

## 2023-10-13 DIAGNOSIS — G8929 Other chronic pain: Secondary | ICD-10-CM | POA: Diagnosis not present

## 2023-10-14 DIAGNOSIS — R42 Dizziness and giddiness: Secondary | ICD-10-CM | POA: Diagnosis not present

## 2023-10-14 DIAGNOSIS — R519 Headache, unspecified: Secondary | ICD-10-CM | POA: Diagnosis not present

## 2023-10-16 DIAGNOSIS — L4050 Arthropathic psoriasis, unspecified: Secondary | ICD-10-CM | POA: Diagnosis not present

## 2023-10-16 DIAGNOSIS — M1A00X0 Idiopathic chronic gout, unspecified site, without tophus (tophi): Secondary | ICD-10-CM | POA: Diagnosis not present

## 2023-10-16 DIAGNOSIS — Z79899 Other long term (current) drug therapy: Secondary | ICD-10-CM | POA: Diagnosis not present

## 2023-10-20 ENCOUNTER — Other Ambulatory Visit: Payer: Self-pay | Admitting: Gastroenterology

## 2023-10-20 DIAGNOSIS — M6281 Muscle weakness (generalized): Secondary | ICD-10-CM | POA: Diagnosis not present

## 2023-10-20 DIAGNOSIS — M25612 Stiffness of left shoulder, not elsewhere classified: Secondary | ICD-10-CM | POA: Diagnosis not present

## 2023-10-20 DIAGNOSIS — G8929 Other chronic pain: Secondary | ICD-10-CM | POA: Diagnosis not present

## 2023-10-20 DIAGNOSIS — Z9889 Other specified postprocedural states: Secondary | ICD-10-CM | POA: Diagnosis not present

## 2023-10-20 DIAGNOSIS — M25512 Pain in left shoulder: Secondary | ICD-10-CM | POA: Diagnosis not present

## 2023-10-20 NOTE — Telephone Encounter (Signed)
 Last office visit 04/14/2023 chronic diarrhea  Last refill 08/22/2023 1 refills 30 tablets  Has appointment 10/27/2023

## 2023-10-21 DIAGNOSIS — R002 Palpitations: Secondary | ICD-10-CM

## 2023-10-22 ENCOUNTER — Ambulatory Visit: Payer: Self-pay | Admitting: Family Medicine

## 2023-10-22 ENCOUNTER — Emergency Department

## 2023-10-22 ENCOUNTER — Other Ambulatory Visit: Payer: Self-pay

## 2023-10-22 ENCOUNTER — Other Ambulatory Visit: Payer: Self-pay | Admitting: Pharmacist

## 2023-10-22 ENCOUNTER — Inpatient Hospital Stay
Admission: EM | Admit: 2023-10-22 | Discharge: 2023-10-24 | DRG: 191 | Disposition: A | Discharge status: Home / Self Care | Attending: Internal Medicine | Admitting: Internal Medicine

## 2023-10-22 DIAGNOSIS — I4891 Unspecified atrial fibrillation: Secondary | ICD-10-CM | POA: Diagnosis present

## 2023-10-22 DIAGNOSIS — Z89022 Acquired absence of left finger(s): Secondary | ICD-10-CM

## 2023-10-22 DIAGNOSIS — Z7901 Long term (current) use of anticoagulants: Secondary | ICD-10-CM

## 2023-10-22 DIAGNOSIS — E785 Hyperlipidemia, unspecified: Secondary | ICD-10-CM | POA: Diagnosis present

## 2023-10-22 DIAGNOSIS — R059 Cough, unspecified: Secondary | ICD-10-CM | POA: Insufficient documentation

## 2023-10-22 DIAGNOSIS — G4733 Obstructive sleep apnea (adult) (pediatric): Secondary | ICD-10-CM

## 2023-10-22 DIAGNOSIS — Z8249 Family history of ischemic heart disease and other diseases of the circulatory system: Secondary | ICD-10-CM

## 2023-10-22 DIAGNOSIS — E1129 Type 2 diabetes mellitus with other diabetic kidney complication: Secondary | ICD-10-CM | POA: Diagnosis present

## 2023-10-22 DIAGNOSIS — Z79899 Other long term (current) drug therapy: Secondary | ICD-10-CM

## 2023-10-22 DIAGNOSIS — E1122 Type 2 diabetes mellitus with diabetic chronic kidney disease: Secondary | ICD-10-CM | POA: Diagnosis present

## 2023-10-22 DIAGNOSIS — Z888 Allergy status to other drugs, medicaments and biological substances status: Secondary | ICD-10-CM

## 2023-10-22 DIAGNOSIS — Z7982 Long term (current) use of aspirin: Secondary | ICD-10-CM

## 2023-10-22 DIAGNOSIS — R0602 Shortness of breath: Secondary | ICD-10-CM | POA: Diagnosis not present

## 2023-10-22 DIAGNOSIS — I2510 Atherosclerotic heart disease of native coronary artery without angina pectoris: Secondary | ICD-10-CM | POA: Diagnosis present

## 2023-10-22 DIAGNOSIS — J111 Influenza due to unidentified influenza virus with other respiratory manifestations: Secondary | ICD-10-CM | POA: Diagnosis present

## 2023-10-22 DIAGNOSIS — Z8614 Personal history of Methicillin resistant Staphylococcus aureus infection: Secondary | ICD-10-CM

## 2023-10-22 DIAGNOSIS — Z86711 Personal history of pulmonary embolism: Secondary | ICD-10-CM | POA: Diagnosis present

## 2023-10-22 DIAGNOSIS — Z860100 Personal history of colon polyps, unspecified: Secondary | ICD-10-CM

## 2023-10-22 DIAGNOSIS — Z1152 Encounter for screening for COVID-19: Secondary | ICD-10-CM

## 2023-10-22 DIAGNOSIS — E66811 Obesity, class 1: Secondary | ICD-10-CM | POA: Diagnosis present

## 2023-10-22 DIAGNOSIS — I340 Nonrheumatic mitral (valve) insufficiency: Secondary | ICD-10-CM | POA: Diagnosis present

## 2023-10-22 DIAGNOSIS — J441 Chronic obstructive pulmonary disease with (acute) exacerbation: Principal | ICD-10-CM | POA: Diagnosis present

## 2023-10-22 DIAGNOSIS — K7581 Nonalcoholic steatohepatitis (NASH): Secondary | ICD-10-CM | POA: Diagnosis present

## 2023-10-22 DIAGNOSIS — I428 Other cardiomyopathies: Secondary | ICD-10-CM | POA: Diagnosis present

## 2023-10-22 DIAGNOSIS — E1169 Type 2 diabetes mellitus with other specified complication: Secondary | ICD-10-CM | POA: Diagnosis present

## 2023-10-22 DIAGNOSIS — Z95810 Presence of automatic (implantable) cardiac defibrillator: Secondary | ICD-10-CM | POA: Diagnosis present

## 2023-10-22 DIAGNOSIS — Z8616 Personal history of COVID-19: Secondary | ICD-10-CM

## 2023-10-22 DIAGNOSIS — I25118 Atherosclerotic heart disease of native coronary artery with other forms of angina pectoris: Secondary | ICD-10-CM | POA: Diagnosis present

## 2023-10-22 DIAGNOSIS — I130 Hypertensive heart and chronic kidney disease with heart failure and stage 1 through stage 4 chronic kidney disease, or unspecified chronic kidney disease: Secondary | ICD-10-CM | POA: Diagnosis present

## 2023-10-22 DIAGNOSIS — I5022 Chronic systolic (congestive) heart failure: Secondary | ICD-10-CM | POA: Diagnosis present

## 2023-10-22 DIAGNOSIS — Z9989 Dependence on other enabling machines and devices: Secondary | ICD-10-CM

## 2023-10-22 DIAGNOSIS — Z7960 Long term (current) use of unspecified immunomodulators and immunosuppressants: Secondary | ICD-10-CM

## 2023-10-22 DIAGNOSIS — Z8673 Personal history of transient ischemic attack (TIA), and cerebral infarction without residual deficits: Secondary | ICD-10-CM

## 2023-10-22 DIAGNOSIS — N644 Mastodynia: Secondary | ICD-10-CM | POA: Diagnosis present

## 2023-10-22 DIAGNOSIS — D84821 Immunodeficiency due to drugs: Secondary | ICD-10-CM | POA: Diagnosis present

## 2023-10-22 DIAGNOSIS — N189 Chronic kidney disease, unspecified: Secondary | ICD-10-CM | POA: Diagnosis present

## 2023-10-22 DIAGNOSIS — Z833 Family history of diabetes mellitus: Secondary | ICD-10-CM

## 2023-10-22 DIAGNOSIS — E662 Morbid (severe) obesity with alveolar hypoventilation: Secondary | ICD-10-CM

## 2023-10-22 DIAGNOSIS — F339 Major depressive disorder, recurrent, unspecified: Secondary | ICD-10-CM | POA: Diagnosis present

## 2023-10-22 DIAGNOSIS — I252 Old myocardial infarction: Secondary | ICD-10-CM

## 2023-10-22 DIAGNOSIS — N1832 Chronic kidney disease, stage 3b: Secondary | ICD-10-CM

## 2023-10-22 DIAGNOSIS — M109 Gout, unspecified: Secondary | ICD-10-CM | POA: Diagnosis present

## 2023-10-22 DIAGNOSIS — Z6834 Body mass index (BMI) 34.0-34.9, adult: Secondary | ICD-10-CM

## 2023-10-22 DIAGNOSIS — Z841 Family history of disorders of kidney and ureter: Secondary | ICD-10-CM

## 2023-10-22 DIAGNOSIS — Z87891 Personal history of nicotine dependence: Secondary | ICD-10-CM

## 2023-10-22 DIAGNOSIS — J449 Chronic obstructive pulmonary disease, unspecified: Secondary | ICD-10-CM | POA: Diagnosis present

## 2023-10-22 DIAGNOSIS — L4050 Arthropathic psoriasis, unspecified: Secondary | ICD-10-CM | POA: Diagnosis present

## 2023-10-22 DIAGNOSIS — Z7985 Long-term (current) use of injectable non-insulin antidiabetic drugs: Secondary | ICD-10-CM

## 2023-10-22 DIAGNOSIS — Z794 Long term (current) use of insulin: Secondary | ICD-10-CM

## 2023-10-22 LAB — RESPIRATORY PANEL BY PCR

## 2023-10-22 LAB — BASIC METABOLIC PANEL
Anion gap: 10 (ref 5–15)
BUN: 25 mg/dL — ABNORMAL HIGH (ref 6–20)
CO2: 21 mmol/L — ABNORMAL LOW (ref 22–32)
Calcium: 8.9 mg/dL (ref 8.9–10.3)
Chloride: 101 mmol/L (ref 98–111)
Creatinine, Ser: 1.35 mg/dL — ABNORMAL HIGH (ref 0.61–1.24)
GFR, Estimated: >60 mL/min (ref >60)
Glucose, Bld: 208 mg/dL — ABNORMAL HIGH (ref 70–99)
Potassium: 4.4 mmol/L (ref 3.5–5.1)
Sodium: 132 mmol/L — ABNORMAL LOW (ref 135–145)

## 2023-10-22 LAB — SARS CORONAVIRUS 2 BY RT PCR: SARS Coronavirus 2 by RT PCR: NEGATIVE

## 2023-10-22 LAB — CBC
HCT: 45.8 % (ref 39.0–52.0)
Hemoglobin: 14.8 g/dL (ref 13.0–17.0)
MCH: 25.7 pg — ABNORMAL LOW (ref 26.0–34.0)
MCHC: 32.3 g/dL (ref 30.0–36.0)
MCV: 79.7 fL — ABNORMAL LOW (ref 80.0–100.0)
Platelets: 333 10*3/uL (ref 150–400)
RBC: 5.75 MIL/uL (ref 4.22–5.81)
RDW: 14.6 % (ref 11.5–15.5)
WBC: 5.5 10*3/uL (ref 4.0–10.5)
nRBC: 0 % (ref 0.0–0.2)

## 2023-10-22 LAB — BLOOD GAS, VENOUS
Acid-Base Excess: 1.7 mmol/L (ref 0.0–2.0)
Bicarbonate: 23.4 mmol/L (ref 20.0–28.0)
O2 Saturation: 93.4 %
Patient temperature: 37
pCO2, Ven: 28 mmHg — ABNORMAL LOW (ref 44–60)
pH, Ven: 7.53 — ABNORMAL HIGH (ref 7.25–7.43)
pO2, Ven: 61 mmHg — ABNORMAL HIGH (ref 32–45)

## 2023-10-22 LAB — TROPONIN I (HIGH SENSITIVITY): Troponin I (High Sensitivity): 51 ng/L — ABNORMAL HIGH (ref <18)

## 2023-10-22 LAB — GLUCOSE, CAPILLARY
Glucose-Capillary: 226 mg/dL — ABNORMAL HIGH (ref 70–99)
Glucose-Capillary: 341 mg/dL — ABNORMAL HIGH (ref 70–99)

## 2023-10-22 LAB — MRSA NEXT GEN BY PCR, NASAL: MRSA by PCR Next Gen: NOT DETECTED

## 2023-10-22 LAB — BRAIN NATRIURETIC PEPTIDE: B Natriuretic Peptide: 125.8 pg/mL — ABNORMAL HIGH (ref 0.0–100.0)

## 2023-10-22 MED ORDER — METHYLPREDNISOLONE SODIUM SUCC 125 MG IJ SOLR
125.0000 mg | Freq: Once | INTRAMUSCULAR | Status: AC
  Administered 2023-10-22: 125 mg via INTRAVENOUS
  Filled 2023-10-22: qty 2

## 2023-10-22 MED ORDER — FENOFIBRATE 160 MG PO TABS
160.0000 mg | ORAL_TABLET | Freq: Every day | ORAL | Status: DC
  Administered 2023-10-22 – 2023-10-23 (×2): 160 mg via ORAL
  Filled 2023-10-22 (×2): qty 1

## 2023-10-22 MED ORDER — ISOSORBIDE MONONITRATE ER 30 MG PO TB24
15.0000 mg | ORAL_TABLET | ORAL | Status: DC
  Administered 2023-10-23 – 2023-10-24 (×2): 15 mg via ORAL
  Filled 2023-10-22 (×2): qty 1

## 2023-10-22 MED ORDER — OXYCODONE HCL 5 MG PO TABS
5.0000 mg | ORAL_TABLET | Freq: Four times a day (QID) | ORAL | Status: AC | PRN
  Administered 2023-10-23: 5 mg via ORAL
  Filled 2023-10-22: qty 1

## 2023-10-22 MED ORDER — UMECLIDINIUM-VILANTEROL 62.5-25 MCG/ACT IN AEPB
1.0000 | INHALATION_SPRAY | RESPIRATORY_TRACT | Status: DC
  Administered 2023-10-23 – 2023-10-24 (×2): 1 via RESPIRATORY_TRACT
  Filled 2023-10-22 (×2): qty 14

## 2023-10-22 MED ORDER — GUAIFENESIN 100 MG/5ML PO LIQD: 5.0000 mL | Freq: Four times a day (QID) | ORAL | Status: DC | PRN

## 2023-10-22 MED ORDER — COLCHICINE 0.6 MG PO TABS: 0.6000 mg | ORAL_TABLET | Freq: Every day | ORAL | Status: DC | PRN

## 2023-10-22 MED ORDER — ALLOPURINOL 100 MG PO TABS
300.0000 mg | ORAL_TABLET | ORAL | Status: DC
  Administered 2023-10-23 – 2023-10-24 (×2): 300 mg via ORAL
  Filled 2023-10-22 (×2): qty 3

## 2023-10-22 MED ORDER — FLUOXETINE HCL 20 MG PO CAPS
20.0000 mg | ORAL_CAPSULE | Freq: Every evening | ORAL | Status: DC
  Administered 2023-10-22 – 2023-10-23 (×2): 20 mg via ORAL
  Filled 2023-10-22 (×2): qty 1

## 2023-10-22 MED ORDER — DAPAGLIFLOZIN PROPANEDIOL 10 MG PO TABS
10.0000 mg | ORAL_TABLET | Freq: Every day | ORAL | Status: DC
  Administered 2023-10-23 – 2023-10-24 (×2): 10 mg via ORAL
  Filled 2023-10-22 (×2): qty 1

## 2023-10-22 MED ORDER — HYDROCOD POLI-CHLORPHE POLI ER 10-8 MG/5ML PO SUER
5.0000 mL | Freq: Every evening | ORAL | Status: DC | PRN
  Administered 2023-10-22 – 2023-10-23 (×2): 5 mL via ORAL
  Filled 2023-10-22 (×3): qty 5

## 2023-10-22 MED ORDER — LORAZEPAM 2 MG/ML IJ SOLN: 0.5000 mg | Freq: Four times a day (QID) | INTRAMUSCULAR | Status: DC | PRN

## 2023-10-22 MED ORDER — IPRATROPIUM-ALBUTEROL 0.5-2.5 (3) MG/3ML IN SOLN: 3.0000 mL | RESPIRATORY_TRACT | Status: AC | PRN

## 2023-10-22 MED ORDER — INSULIN GLARGINE 100 UNIT/ML ~~LOC~~ SOLN
36.0000 [IU] | Freq: Every day | SUBCUTANEOUS | Status: DC
  Administered 2023-10-22 – 2023-10-23 (×2): 36 [IU] via SUBCUTANEOUS
  Filled 2023-10-22 (×3): qty 0.36

## 2023-10-22 MED ORDER — RIVAROXABAN 20 MG PO TABS
20.0000 mg | ORAL_TABLET | Freq: Every day | ORAL | Status: DC
  Administered 2023-10-22 – 2023-10-23 (×2): 20 mg via ORAL
  Filled 2023-10-22 (×3): qty 1

## 2023-10-22 MED ORDER — ENOXAPARIN SODIUM 60 MG/0.6ML IJ SOSY: 55.0000 mg | PREFILLED_SYRINGE | Freq: Every day | INTRAMUSCULAR | Status: DC

## 2023-10-22 MED ORDER — MECLIZINE HCL 25 MG PO TABS: 12.5000 mg | ORAL_TABLET | Freq: Three times a day (TID) | ORAL | Status: DC | PRN

## 2023-10-22 MED ORDER — ONDANSETRON HCL 4 MG/2ML IJ SOLN: 4.0000 mg | Freq: Four times a day (QID) | INTRAMUSCULAR | Status: DC | PRN

## 2023-10-22 MED ORDER — ROSUVASTATIN CALCIUM 20 MG PO TABS
20.0000 mg | ORAL_TABLET | Freq: Every day | ORAL | Status: DC
  Administered 2023-10-22 – 2023-10-23 (×2): 20 mg via ORAL
  Filled 2023-10-22 (×2): qty 1

## 2023-10-22 MED ORDER — FLUTICASONE PROPIONATE 50 MCG/ACT NA SUSP: 2.0000 | Freq: Every day | NASAL | Status: DC | PRN

## 2023-10-22 MED ORDER — INSULIN ASPART 100 UNIT/ML IJ SOLN
0.0000 [IU] | Freq: Three times a day (TID) | INTRAMUSCULAR | Status: DC
  Administered 2023-10-22: 5 [IU] via SUBCUTANEOUS
  Administered 2023-10-23: 11 [IU] via SUBCUTANEOUS
  Administered 2023-10-23 – 2023-10-24 (×4): 5 [IU] via SUBCUTANEOUS
  Filled 2023-10-22 (×6): qty 1

## 2023-10-22 MED ORDER — ASPIRIN 81 MG PO TBEC
81.0000 mg | DELAYED_RELEASE_TABLET | ORAL | Status: DC
  Administered 2023-10-23 – 2023-10-24 (×2): 81 mg via ORAL
  Filled 2023-10-22 (×2): qty 1

## 2023-10-22 MED ORDER — ACETAMINOPHEN 325 MG PO TABS: 650.0000 mg | ORAL_TABLET | Freq: Four times a day (QID) | ORAL | Status: DC | PRN

## 2023-10-22 MED ORDER — SACUBITRIL-VALSARTAN 97-103 MG PO TABS
1.0000 | ORAL_TABLET | Freq: Two times a day (BID) | ORAL | Status: DC
  Administered 2023-10-22 – 2023-10-23 (×3): 1 via ORAL
  Filled 2023-10-22 (×4): qty 1

## 2023-10-22 MED ORDER — CARVEDILOL 6.25 MG PO TABS
12.5000 mg | ORAL_TABLET | Freq: Two times a day (BID) | ORAL | Status: DC
  Administered 2023-10-22 – 2023-10-23 (×3): 12.5 mg via ORAL
  Filled 2023-10-22 (×3): qty 2

## 2023-10-22 MED ORDER — ACETAMINOPHEN 650 MG RE SUPP: 650.0000 mg | Freq: Four times a day (QID) | RECTAL | Status: DC | PRN

## 2023-10-22 MED ORDER — PREDNISONE 20 MG PO TABS
40.0000 mg | ORAL_TABLET | Freq: Every day | ORAL | Status: DC
  Administered 2023-10-24: 40 mg via ORAL
  Filled 2023-10-22: qty 2

## 2023-10-22 MED ORDER — INSULIN ASPART 100 UNIT/ML IJ SOLN
0.0000 [IU] | Freq: Every day | INTRAMUSCULAR | Status: DC
  Administered 2023-10-22 – 2023-10-23 (×2): 4 [IU] via SUBCUTANEOUS
  Filled 2023-10-22 (×2): qty 1

## 2023-10-22 MED ORDER — NITROGLYCERIN 0.4 MG SL SUBL: 0.4000 mg | SUBLINGUAL_TABLET | SUBLINGUAL | Status: DC | PRN

## 2023-10-22 MED ORDER — IPRATROPIUM-ALBUTEROL 0.5-2.5 (3) MG/3ML IN SOLN
3.0000 mL | Freq: Once | RESPIRATORY_TRACT | Status: AC
  Administered 2023-10-22: 3 mL via RESPIRATORY_TRACT
  Filled 2023-10-22: qty 3

## 2023-10-22 MED ORDER — FENTANYL CITRATE PF 50 MCG/ML IJ SOSY
50.0000 ug | PREFILLED_SYRINGE | Freq: Once | INTRAMUSCULAR | Status: AC
  Administered 2023-10-22: 50 ug via INTRAVENOUS
  Filled 2023-10-22: qty 1

## 2023-10-22 MED ORDER — IPRATROPIUM-ALBUTEROL 0.5-2.5 (3) MG/3ML IN SOLN
3.0000 mL | Freq: Four times a day (QID) | RESPIRATORY_TRACT | Status: DC
  Administered 2023-10-22 – 2023-10-23 (×2): 3 mL via RESPIRATORY_TRACT
  Filled 2023-10-22 (×3): qty 3

## 2023-10-22 MED ORDER — TORSEMIDE 20 MG PO TABS
20.0000 mg | ORAL_TABLET | ORAL | Status: DC
  Administered 2023-10-23 – 2023-10-24 (×2): 20 mg via ORAL
  Filled 2023-10-22 (×2): qty 1

## 2023-10-22 MED ORDER — ONDANSETRON HCL 4 MG PO TABS: 4.0000 mg | ORAL_TABLET | Freq: Four times a day (QID) | ORAL | Status: DC | PRN

## 2023-10-22 MED ORDER — METHYLPREDNISOLONE SODIUM SUCC 40 MG IJ SOLR
40.0000 mg | Freq: Two times a day (BID) | INTRAMUSCULAR | Status: AC
  Administered 2023-10-23 (×2): 40 mg via INTRAVENOUS
  Filled 2023-10-22 (×2): qty 1

## 2023-10-22 MED ORDER — PANTOPRAZOLE SODIUM 40 MG PO TBEC
80.0000 mg | DELAYED_RELEASE_TABLET | Freq: Every day | ORAL | Status: DC
  Administered 2023-10-23 – 2023-10-24 (×2): 80 mg via ORAL
  Filled 2023-10-22 (×2): qty 2

## 2023-10-22 MED ORDER — SPIRONOLACTONE 25 MG PO TABS
25.0000 mg | ORAL_TABLET | Freq: Every day | ORAL | Status: DC
  Administered 2023-10-23: 25 mg via ORAL
  Filled 2023-10-22: qty 1

## 2023-10-22 MED ORDER — LIDOCAINE 5 % EX PTCH
1.0000 | MEDICATED_PATCH | CUTANEOUS | Status: DC
  Administered 2023-10-22 – 2023-10-23 (×2): 2 via TRANSDERMAL
  Filled 2023-10-22 (×3): qty 2

## 2023-10-22 NOTE — ED Notes (Signed)
 Alerted MD, Kinner to patients now 9/10 bilateral chest pain. Patients work of breathing also labored.

## 2023-10-22 NOTE — Assessment & Plan Note (Signed)
 Guaifenesin every 6 hours as needed to loosen phlegm and cough during the day, 3 days ordered; Tussionex 5 ml nightly as needed for cough, 2 days ordered

## 2023-10-22 NOTE — Telephone Encounter (Signed)
  Chief Complaint: shortness of breath Symptoms: shortness of breath, congestion, abdominal pain Frequency: since Sunday Pertinent Negatives: Patient denies fever, chest pain,dizziness Disposition: [x] ED /[] Urgent Care (no appt availability in office) / [] Appointment(In office/virtual)/ []  [facility]/ [] Home Care/ [] Refused Recommended Disposition /[] [facility]/ []  Follow-up with PCP Additional Notes: Patient reports he has been experiencing shortness of breath since Sunday which has progressively gotten worse. Shortness of breath is now severe. This RN observes patient is struggling to speak a a few words without sounding winded. Patient advised he is also struggling with a cough which is causing "abdominal pain from coughing so hard". Denies any known sick contacts, chest pain, fever, or dizziness. Patient reports a history of COPD but does not require oxygen at baseline and is unsure of current o2 saturation. Advised patient to call EMS. Patient advised he will drive himself to the ED, but will go now.     Copied from CRM [id number]. Topic: Clinical - Red Word Triage >> Oct 22, 2023  9:55 AM Priscille Loveless wrote: Red Word that prompted transfer to Nurse Triage: Pt called for appt but he can barely breathe. He stated that he has congestion. While on the phone he sounded like he was gasping for air. Reason for Disposition  SEVERE difficulty breathing (e.g., struggling for each breath, speaks in single words)  Answer Assessment - Initial Assessment Questions 1. RESPIRATORY STATUS: "Describe your breathing?" (e.g., wheezing, shortness of breath, unable to speak, severe coughing)      Shortness of breath 2. ONSET: "When did this breathing problem begin?"      Sunday 3. PATTERN "Does the difficult breathing come and go, or has it been constant since it started?"      constant 4. SEVERITY: "How bad is your breathing?" (e.g., mild, moderate, severe)    - MILD: No SOB at rest, mild  SOB with walking, speaks normally in sentences, can lie down, no retractions, pulse < 100.    - MODERATE: SOB at rest, SOB with minimal exertion and prefers to sit, cannot lie down flat, speaks in phrases, mild retractions, audible wheezing, pulse 100-120.    - SEVERE: Very SOB at rest, speaks in single words, struggling to breathe, sitting hunched forward, retractions, pulse > 120      severe 5. RECURRENT SYMPTOM: "Have you had difficulty breathing before?" If Yes, ask: "When was the last time?" and "What happened that time?"      none 6. CARDIAC HISTORY: "Do you have any history of heart disease?" (e.g., heart attack, angina, bypass surgery, angioplasty)      none 7. LUNG HISTORY: "Do you have any history of lung disease?"  (e.g., pulmonary embolus, asthma, emphysema)     COPD 8. CAUSE: "What do you think is causing the breathing problem?"      unsure 9. OTHER SYMPTOMS: "Do you have any other symptoms? (e.g., dizziness, runny nose, cough, chest pain, fever)     Cough, abdominal pain from coughing, runny nose 10. O2 SATURATION MONITOR:  "Do you use an oxygen saturation monitor (pulse oximeter) at home?" If Yes, ask: "What is your reading (oxygen level) today?" "What is your usual oxygen saturation reading?" (e.g., 95%)       unsure  Protocols used: Breathing Difficulty-A-AH

## 2023-10-22 NOTE — Assessment & Plan Note (Signed)
 CPAP nightly ordered

## 2023-10-22 NOTE — Assessment & Plan Note (Addendum)
 with increased work of breathing, etiology workup in progress  20 pathogen respiratory panel, MRSA PCR, COVID PCR Check VBG DuoNebs 4 times daily, 4 doses ordered on admission Solu-Medrol 40 mg for 10/23/2023, followed by prednisone taper Continue oxygen supplementation to maintain SpO2 greater than 92% Telemetry medical, observation

## 2023-10-22 NOTE — Progress Notes (Signed)
   10/22/2023  Patient ID: Gabriel Beets., male   DOB: Sep 15, 1970, 53 y.o.   MRN: [id number]  Reach patient by telephone today. Reports that he has had a head cold since Sunday. Reports congestion and feeling short of breath. Reports taking his Anoro inhaler consistently as well as using his rescue inhaler/nebulizer treatments, which have helped. However, reports not feeling better and sometimes dizzy if stands up for too long.  Reports blood sugar currently controlled. Per data from [facility], blood sugar ~130 this morning.   Recommend patient contact PCP office this morning regarding current symptoms/request appointment regarding his current respiratory symptoms - Send PCP message to let her know of advice given  Follow Up Plan: Clinical Pharmacist will follow up with patient by telephone within the next 30 days  Estelle Grumbles, PharmD, [facility] [phone]

## 2023-10-22 NOTE — ED Provider Notes (Signed)
 [facility] Provider Note    Event Date/Time   First MD Initiated Contact with Patient 10/22/23 1053     (approximate)   History   Shortness of Breath   HPI  Gabriel [name]. is a 53 y.o. male with a history of CHF, diabetes, history of PE, COPD, ICD, CKD who presents with complaints of shortness of breath.  He reports this started over the last day or 2.  He has tried his inhaler at home with some improvement.  Does have a cough, does have chills.     Physical Exam   Triage Vital Signs: ED Triage Vitals  Encounter Vitals Group     BP 10/22/23 1049 (!) 141/88     Systolic BP Percentile --      Diastolic BP Percentile --      Pulse Rate 10/22/23 1048 97     Resp 10/22/23 1048 (!) 24     Temp 10/22/23 1048 98.1 F (36.7 C)     Temp src --      SpO2 10/22/23 1048 100 %     Weight 10/22/23 1049 111.1 kg (245 lb)     Height 10/22/23 1049 1.803 m (5\' 11" )     Head Circumference --      Peak Flow --      Pain Score 10/22/23 1049 10     Pain Loc --      Pain Education --      Exclude from Growth Chart --     Most recent vital signs: Vitals:   10/22/23 1231 10/22/23 1245  BP:    Pulse:    Resp: 18 16  Temp:    SpO2:       General: Awake, no distress.  CV:  Good peripheral perfusion.  Resp:  Tachypnea, scattered wheezes, bibasilar Rales Abd:  No distention.  Other:  No lower extremity edema   ED Results / Procedures / Treatments   Labs (all labs ordered are listed, but only abnormal results are displayed) Labs Reviewed  CBC - Abnormal; Notable for the following components:      Result Value   MCV 79.7 (*)    MCH 25.7 (*)    All other components within normal limits  BASIC METABOLIC PANEL - Abnormal; Notable for the following components:   Sodium 132 (*)    CO2 21 (*)    Glucose, Bld 208 (*)    BUN 25 (*)    Creatinine, Ser 1.35 (*)    All other components within normal limits  BRAIN NATRIURETIC PEPTIDE - Abnormal;  Notable for the following components:   B Natriuretic Peptide 125.8 (*)    All other components within normal limits  TROPONIN I (HIGH SENSITIVITY) - Abnormal; Notable for the following components:   Troponin I (High Sensitivity) 51 (*)    All other components within normal limits     EKG  ED ECG REPORT I, Jene Every, the attending physician, personally viewed and interpreted this ECG.  Date: 10/22/2023  Rhythm: normal sinus rhythm QRS Axis: normal Intervals: normal ST/T Wave abnormalities: normal Narrative Interpretation: no evidence of acute ischemia    RADIOLOGY Chest x-ray viewed interpret by me, no evidence of pneumonia, confirmed by radiology    PROCEDURES:  Critical Care performed:   Procedures   MEDICATIONS ORDERED IN ED: Medications  ipratropium-albuterol (DUONEB) 0.5-2.5 (3) MG/3ML nebulizer solution 3 mL (has no administration in time range)  ipratropium-albuterol (DUONEB) 0.5-2.5 (3) MG/3ML nebulizer  solution 3 mL (3 mLs Nebulization Given 10/22/23 1117)  ipratropium-albuterol (DUONEB) 0.5-2.5 (3) MG/3ML nebulizer solution 3 mL (3 mLs Nebulization Given 10/22/23 1117)  methylPREDNISolone sodium succinate (SOLU-MEDROL) 125 mg/2 mL injection 125 mg (125 mg Intravenous Given 10/22/23 1114)  fentaNYL (SUBLIMAZE) injection 50 mcg (50 mcg Intravenous Given 10/22/23 1353)     IMPRESSION / MDM / ASSESSMENT AND PLAN / ED COURSE  I reviewed the triage vital signs and the nursing notes. Patient's presentation is most consistent with acute presentation with potential threat to life or bodily function.  Patient presents with shortness of breath as detailed above, he is tachypneic here, currently maintaining oxygen saturations.  Differential includes COPD exacerbation, CHF exacerbation, pneumonia  Will treat with DuoNebs, IV Solu-Medrol while we await labs, chest x-ray  ----------------------------------------- 2:17 PM on  10/22/2023 ----------------------------------------- Patient still with wheezing, shortness of breath and tachypnea, requiring supplemental oxygen, will admit to the hospitalist team      FINAL CLINICAL IMPRESSION(S) / ED DIAGNOSES   Final diagnoses:  COPD exacerbation (HCC)     Rx / DC Orders   ED Discharge Orders     None        Note:  This document was prepared using Dragon voice recognition software and may include unintentional dictation errors.   Jene Every, MD 10/22/23 [phone]

## 2023-10-22 NOTE — Assessment & Plan Note (Signed)
 Home rosuvastatin 2 mg daily, Imdur 15 mg in the morning, Coreg 12.5 mg p.o. twice daily, Entresto home dosing, spironolactone were resumed on admission

## 2023-10-22 NOTE — Hospital Course (Addendum)
 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  Vitals in the ED showed temperature of 98.1, respiration 24, heart rate 97, blood pressure 141/88, SpO2 of 97% on room air.  Serum sodium is 132, potassium 4.4, chloride 101, bicarb 21, BUN of 25, serum creatinine 1.35, EGFR greater than 60, nonfasting blood glucose 5.5, hemoglobin 14.8, platelets of 333.  BNP was 125.8.  High sensitive troponin is 51.  ED treatment: Fentanyl 50 mcg IV one-time dose, DuoNebs 2 treatments at 11:00, 1 more DuoNeb treatment at 1420, Solu-Medrol 125 mg IV one-time dose.  Per EDP, patient remains with increased work of breathing despite 3 treatments of DuoNeb's.

## 2023-10-22 NOTE — ED Triage Notes (Signed)
 Pt to ED for shob started on Sunday with cough. Reports has been using inhalers with no relief. +abd pain

## 2023-10-22 NOTE — Assessment & Plan Note (Signed)
 Home rosuvastatin 20 mg nightly resumed

## 2023-10-22 NOTE — Assessment & Plan Note (Signed)
 Home fluoxetine 20 mg every evening resumed on admission

## 2023-10-22 NOTE — H&P (Addendum)
 History and Physical   Gabriel [name]. WUJ:[id number] DOB: 08-05-1971 DOA: 10/22/2023  PCP: Sallee Provencal, FNP  Outpatient Specialists: Dr. Cristopher Peru, [facility] clinic neurology Patient coming from: Home  I have personally briefly reviewed patient's old medical records in [facility] EMR.  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  Vitals in the ED showed temperature of 98.1, respiration 24, heart rate 97, blood pressure 141/88, SpO2 of 97% on room air.  Serum sodium is 132, potassium 4.4, chloride 101, bicarb 21, BUN of 25, serum creatinine 1.35, EGFR greater than 60, nonfasting blood glucose 5.5, hemoglobin 14.8, platelets of 333.  BNP was 125.8.  High sensitive troponin is 51.  ED treatment: Fentanyl 50 mcg IV one-time dose, DuoNebs 2 treatments at 11:00, 1 more DuoNeb treatment at 1420, Solu-Medrol 125 mg IV one-time dose.  Per EDP, patient remains with increased work of breathing despite 3 treatments of DuoNeb's. ------------------------------------ At bedside, patient he is able to tell me his name, age, current location, current calendar year.   He denies recent trauma to his person. He reports difficulty getting sputum up. He has been coughing almost nonstop since Sunday. He reports bilateral upper abdominal and right sided breast pain worse everytime he coughs. He describes the pain as dull and worse with coughing. He denies dysuria, hematuria, diarrhea, blood his stool, swelling in his legs.  He denies fever. He endorses chills.   Social  history: He currently lives with his father. He is a former tobacco user. At the peak, he smoked 2.5 ppd. He denies etoh and recreational drugs. He is currently disabled and formerly worked in Passenger transport manager work.  ROS: Constitutional: no weight change, no fever, + chills ENT/Mouth: no sore throat, no rhinorrhea Eyes: no eye pain, no vision changes Cardiovascular: no chest pain, + dyspnea,  no edema, no palpitations Respiratory: + cough, no sputum, no wheezing Gastrointestinal: no nausea, no vomiting, no diarrhea, no constipation Genitourinary: no urinary incontinence, no dysuria, no hematuria Musculoskeletal: no arthralgias, no myalgias Skin: no skin lesions, no pruritus, Neuro: + weakness, no loss of consciousness, no syncope Psych: no anxiety, no depression, + decrease appetite Heme/Lymph: no bruising, no bleeding  ED Course: Discussed with EDP, patient requiring hospitalization for chief concerns of COPD exacerbation.  Assessment/Plan  Principal Problem:   COPD exacerbation (HCC) Active Problems:   Chronic obstructive pulmonary disease (COPD) (HCC)   Coronary artery disease, non-occlusive   History of pulmonary embolism   ICD (implantable cardioverter-defibrillator) in place   NICM (nonischemic cardiomyopathy) (HCC)   Type 2 diabetes mellitus with hyperlipidemia (HCC)   Type II diabetes mellitus with renal manifestations (HCC)   OSA on CPAP   Atherosclerotic heart disease of native coronary artery with other forms of angina pectoris (HCC)   Long term current use of anticoagulant   Hyperlipidemia associated with type 2 diabetes mellitus (HCC)   Depression, recurrent (HCC)   CPAP (continuous positive airway pressure) dependence   Class 1 obesity with alveolar hypoventilation, serious comorbidity, and body mass index (BMI) of 34.0 to 34.9 in adult (HCC)   Cough   Assessment and Plan:  *  COPD exacerbation (HCC) with increased work of breathing,  etiology workup in progress  20 pathogen respiratory panel, MRSA PCR, COVID PCR Check VBG DuoNebs 4 times daily, 4 doses ordered on admission Solu-Medrol 40 mg for 10/23/2023, followed by prednisone taper Continue oxygen supplementation to maintain SpO2 greater than 92% Telemetry medical, observation  History of pulmonary embolism Home Xarelto resumed on admission  Cough Guaifenesin every 6 hours as needed to loosen phlegm and cough during the day, 3 days ordered; Tussionex 5 ml nightly as needed for cough, 2 days ordered  CPAP (continuous positive airway pressure) dependence CPAP nightly ordered  Depression, recurrent (HCC) Home fluoxetine 20 mg every evening resumed on admission  Hyperlipidemia associated with type 2 diabetes mellitus (HCC) Home rosuvastatin 20 mg nightly resumed  Atherosclerotic heart disease of native coronary artery with other forms of angina pectoris (HCC) Home rosuvastatin 2 mg daily, Imdur 15 mg in the morning, Coreg 12.5 mg p.o. twice daily, Entresto home dosing, spironolactone were resumed on admission  OSA on CPAP CPAP nightly ordered  PDMP reviewed patient had oxycodone 5 mg prescribed on 09/16/2023, for 4 days, 40 tablets  Chart reviewed.   DVT prophylaxis: Home Xarelto Code Status: full code Diet: heart healthy Family Communication: a p hone call was offered, paitent declined  Disposition Plan: Pending clinical course Consults called: RT for cpap qhs Admission status: Telemetry medical, observation  Past Medical History:  Diagnosis Date   Aortic root dilation (HCC) 11/28/2016   a.) TTE 11/28/2016: 43 mm; b.) TTE 11/23/2019: 38 mm; c.) TTE 08/14/2022: 40 mm; d.) TTE 03/08/2023: 43 mm   Arteriosclerosis of right carotid artery    a.) carotid doppler 04/29/2017: <50% RICA   Bronchitis 07/2023   CAD (coronary artery disease)    a.) MV 04/2015: low risk; b.) LHC 01/06/2017: minor irregs in LAD/Diag/LCX/OM, RCA 40p/m/d; c.) R/LHC 06/13/2020: 50%  dLAD, 30% p-mLCx, 40% OM1, 100% pRCA (L-R collats) - med mgmt; d.) R/LHC 08/19/2023: 50% dLAD, 30% p-mLCx, 40% OM1, 100% pRCA (L-R collats), 50% p-mRCA - med mgmt   Chest wall pain, chronic    Chronic 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    Chronic Troponin Elevation    CKD (chronic kidney disease), stage II-III    COPD (chronic obstructive pulmonary disease) (HCC)    Depression    DM (diabetes mellitus), type 2 (HCC)    HFrEF (heart failure with reduced ejection fraction) (HCC)    a.) EF 03/20/2015: EF 45-50%; b.) MV 05/19/2015: EF 30-44%; c.) TTE 01/04/2016: EF 20-25%; d.) TTE 02/29/2016: EF 30-35%; e.) TTE 11/28/2016: EF 40-45%; f.) LHC 01/06/2017: EF 25-35%; g.) TTE 04/29/2017: EF 45-50%; h.) TTE 06/26/2019: EF 30-35%; i.) TTE 11/23/2019: EF 25-30%; j.) TTE 06/08/2020: EF 35-40%; k.) TTE 05/21/2021: EF 40-45%; l.) TTE 08/14/2022: EF 40-45%; m.) TTE 03/08/2023: EF 40-45%   History of 2019 novel coronavirus disease (COVID-19) 03/01/2022   History of methicillin resistant staphylococcus aureus (MRSA)    Hyperlipidemia    Hypertension    ICD (implantable cardioverter-defibrillator) in place 05/20/2022   a.) LEFT midxillary [employer] Emblem MRI S-ICD (SN: [id number])   Left rotator cuff tear    Long term current use of immunosuppressive drug    a.) ixekizumab for psoriasis/psoriatic arthritis diagnosis   Long-term use of aspirin therapy    Mediastinal lymphadenopathy    Mitral regurgitation    Mild to moderate by October 2021 echocardiogram.   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 12/2015 noted on tele-->amio;  b. 12/2015 Event monitor: no VT noted.   Obesity (BMI 30.0-34.9)    On rivaroxaban  therapy    OSA treated with BiPAP    Polyp of descending colon    Psoriasis    Psoriatic arthritis (HCC)    a.) Tx'd with ixekizumab   Pulmonary HTN (HCC) 02/29/2016   a.) TTE 02/29/2016: RVSP 47; b.) TTE 06/26/2019: RVSP 58.4; c.) TTE 12/08/2019: RVSP 40.8; d.) TTE 06/08/2020: RVSP 44.7; e.) R/LHC 06/08/2020: mRA 9, mPA 40, mPWCP 17, AO sat 98, PA sat 61, CO 3.1, CI 1.3, PVR 7.4, LVEDP 20; f.) R/LHC 08/18/2020: mRA 5, mPA 41, mPCWP 18, AO sat 92, PA sat 59, CO 4.3, CI 1.9, PVR 5.3   Recurrent pulmonary emboli (HCC) 06/07/2020   06/07/20: small bilateral PEs.  12/31/19: RUL and RLL PEs.   Stroke [facility])    pt states per mri in 2023-unsure of when stroke happened   Syncope    a. 01/2016 - felt to be vasovagal.   Thrombocytopenia (HCC)    Past Surgical History:  Procedure Laterality Date   COLONOSCOPY WITH PROPOFOL N/A 08/01/2022   Procedure: COLONOSCOPY WITH PROPOFOL;  Surgeon: Toney Reil, MD;  Location: [facility] ENDOSCOPY;  Service: Gastroenterology;  Laterality: N/A;   ESOPHAGOGASTRODUODENOSCOPY (EGD) WITH PROPOFOL N/A 08/01/2022   Procedure: ESOPHAGOGASTRODUODENOSCOPY (EGD) WITH PROPOFOL;  Surgeon: Toney Reil, MD;  Location: [facility] ENDOSCOPY;  Service: Gastroenterology;  Laterality: N/A;   ESOPHAGOGASTRODUODENOSCOPY (EGD) WITH PROPOFOL N/A 10/16/2022   Procedure: ESOPHAGOGASTRODUODENOSCOPY (EGD) WITH PROPOFOL;  Surgeon: Toney Reil, MD;  Location: [facility] ENDOSCOPY;  Service: Gastroenterology;  Laterality: N/A;   FINGER AMPUTATION Left    Traumatic   LEFT HEART CATH AND CORONARY ANGIOGRAPHY N/A 01/06/2017   Procedure: Left Heart Cath and Coronary Angiography;  Surgeon: Iran Ouch, MD;  Location: [facility] INVASIVE CV LAB;  Service: Cardiovascular;  Laterality: N/A;   RIGHT/LEFT HEART CATH AND CORONARY ANGIOGRAPHY N/A 06/13/2020   Procedure: RIGHT/LEFT HEART CATH AND CORONARY ANGIOGRAPHY;  Surgeon: Yvonne Kendall, MD;  Location: [facility] INVASIVE CV LAB;  Service:  Cardiovascular;  Laterality: N/A;   RIGHT/LEFT HEART CATH AND CORONARY ANGIOGRAPHY Bilateral 08/19/2023   Procedure: RIGHT/LEFT HEART CATH AND CORONARY ANGIOGRAPHY;  Surgeon: Yvonne Kendall, MD;  Location: [facility] INVASIVE CV LAB;  Service: Cardiovascular;  Laterality: Bilateral;   SHOULDER ARTHROSCOPY WITH SUBACROMIAL DECOMPRESSION, ROTATOR CUFF REPAIR AND BICEP TENDON REPAIR Left 09/16/2023   Procedure: SHOULDER ARTHROSCOPY WITH DEBRIDEMENT, DECOMPRESSION, ROTATOR CUFF REPAIR AND BICEPS TENODESIS.;  Surgeon: Christena Flake, MD;  Location: [facility] ORS;  Service: Orthopedics;  Laterality: Left;   SUBQ ICD IMPLANT N/A 05/20/2022   Procedure: SUBQ ICD IMPLANT;  Surgeon: Lanier Prude, MD;  Location: [facility] INVASIVE CV LAB;  Service: Cardiovascular;  Laterality: N/A;   Social History:  reports that he quit smoking about 3 years ago. His smoking use included cigarettes. He started smoking about 36 years ago. He has a 16.5 pack-year smoking history. He quit smokeless tobacco use about 3 years ago. He reports that he does not currently use alcohol. He reports that he does not use drugs.  Allergies  Allergen Reactions   Metformin And Related Nausea And Vomiting   Zantac [Ranitidine Hcl] Diarrhea and Nausea Only  Night sweats   Family History  Problem Relation Age of Onset   Diabetes Mother    Diabetes Mellitus II Mother    Hypothyroidism Mother    Hypertension Mother    Kidney failure Mother        Dialysis   Heart attack Mother        43 yo approximately   Hypertension Father    Gout Father    Cancer Maternal Grandfather    Diabetes Maternal Grandfather    Cancer Paternal Aunt    Family history: Family history reviewed and not pertinent.  Prior to Admission medications   Medication Sig Start Date End Date Taking? Authorizing Provider  Accu-Chek Softclix Lancets lancets USE UP TO FOUR TIMES DAILY 07/10/22   [provider]  albuterol (VENTOLIN HFA) 108 (90 Base) MCG/ACT inhaler  Inhale 2 puffs into the lungs every 4 (four) hours as needed for wheezing or shortness of breath. 10/21/22   Raechel Chute, MD  allopurinol (ZYLOPRIM) 300 MG tablet Take 300 mg by mouth every morning.    [provider]  aspirin EC 81 MG tablet Take 81 mg by mouth every morning.    [provider]  azelastine (ASTELIN) 0.1 % nasal spray Place 2 sprays into both nostrils 2 (two) times daily. Use in each nostril as directed 07/31/23   Sallee Provencal, FNP  BD PEN NEEDLE NANO 2ND GEN 32G X 4 MM MISC USE  AS DIRECTED AT BEDTIME 02/03/23   Jacky Kindle, FNP  blood glucose meter kit and supplies KIT Dispense based on patient and insurance preference. Use up to four times daily as directed. 07/08/22   Jacky Kindle, FNP  carvedilol (COREG) 12.5 MG tablet Take 1 tablet (12.5 mg total) by mouth 2 (two) times daily. 10/07/23 01/05/24  Delma Freeze, FNP  cetirizine (ZYRTEC) 10 MG tablet Take 1 tablet (10 mg total) by mouth every morning. 09/16/23   Poggi, Excell Seltzer, MD  colchicine 0.6 MG tablet Take 1 tablet (0.6 mg total) by mouth daily as needed (gout flare). 09/16/23   Poggi, Excell Seltzer, MD  Continuous Glucose Sensor (FREESTYLE LIBRE 3 SENSOR) MISC PLACE 1 SENSOR ON TO THE SKIN EVERY 14 DAYS. USE TO CHECK BLOOD SUGAR CONTINUEOUSLY.    [provider]  Continuous Glucose Sensor (FREESTYLE LIBRE 3 SENSOR) MISC 1 Application by Does not apply route every 14 (fourteen) days. Use to check blood sugar continuously. 07/11/23   Jacky Kindle, FNP  Continuous Glucose Sensor (FREESTYLE LIBRE 3 SENSOR) MISC Apply sensor every 14 (fourteen) days to check blood sugar continuously. 09/23/23   Sallee Provencal, FNP  dapagliflozin propanediol (FARXIGA) 10 MG TABS tablet Take 1 tablet (10 mg total) by mouth daily. 07/07/23   Delma Freeze, FNP  fenofibrate (TRICOR) 145 MG tablet Take 1 tablet (145 mg total) by mouth every evening. 09/16/23   Poggi, Excell Seltzer, MD  FLUoxetine (PROZAC) 20 MG capsule Take 1  capsule (20 mg total) by mouth every evening. 09/16/23   Poggi, Excell Seltzer, MD  fluticasone (FLONASE) 50 MCG/ACT nasal spray Place 2 sprays into both nostrils daily as needed for allergies or rhinitis. Home med. 09/16/23   Poggi, Excell Seltzer, MD  Insulin Glargine [facility]) 100 UNIT/ML Inject 36 Units into the skin at bedtime. 07/11/23 11/22/24  Jacky Kindle, FNP  insulin lispro (HUMALOG) 100 UNIT/ML injection Inject 10 Units into the skin 3 (three) times daily before meals.    [provider]  Insulin Pen Needle 32G X 4 MM MISC 1 each by Does not apply route in the morning, at noon, in the evening, and at bedtime. 02/05/23   Jacky Kindle, FNP  ipratropium-albuterol (DUONEB) 0.5-2.5 (3) MG/3ML SOLN Take 3 mLs by nebulization every 6 (six) hours as needed. 07/31/23   Sallee Provencal, FNP  isosorbide mononitrate (IMDUR) 30 MG 24 hr tablet Take 0.5 tablets (15 mg total) by mouth every morning. 09/16/23 09/15/24  Poggi, Excell Seltzer, MD  meclizine (ANTIVERT) 25 MG tablet Take 0.5 tablets (12.5 mg total) by mouth 3 (three) times daily as needed for dizziness. 09/16/23   Poggi, Excell Seltzer, MD  metoCLOPramide (REGLAN) 5 MG tablet Take 1 tablet (5 mg total) by mouth every 8 (eight) hours as needed for nausea. 04/14/23   Toney Reil, MD  naltrexone (DEPADE) 50 MG tablet Take 1 tablet (50 mg total) by mouth every morning. 09/16/23   Poggi, Excell Seltzer, MD  nitroGLYCERIN (NITROSTAT) 0.4 MG SL tablet Place 1 tablet (0.4 mg total) under the tongue every 5 (five) minutes x 3 doses as needed for chest pain. 09/17/22   Jacky Kindle, FNP  omeprazole (PRILOSEC) 40 MG capsule Take 1 capsule (40 mg total) by mouth every morning. 09/16/23   Poggi, Excell Seltzer, MD  oxyCODONE (ROXICODONE) 5 MG immediate release tablet Take 1-2 tablets (5-10 mg total) by mouth every 4 (four) hours as needed for moderate pain (pain score 4-6) or severe pain (pain score 7-10). 09/16/23   Poggi, Excell Seltzer, MD  potassium chloride SA (KLOR-CON M) 20 MEQ tablet  Take 1 tablet (20 mEq total) by mouth every other day. AM 09/16/23   Poggi, Excell Seltzer, MD  REZDIFFRA 100 MG TABS TAKE 1 TABLET BY MOUTH DAILY WITH OR WITHOUT FOOD 10/20/23   Toney Reil, MD  rivaroxaban (XARELTO) 20 MG TABS tablet Take 1 tablet (20 mg total) by mouth daily with supper. 07/01/23   Bensimhon, Bevelyn Buckles, MD  rosuvastatin (CRESTOR) 20 MG tablet Take 1 tablet (20 mg total) by mouth at bedtime. 09/16/23   Poggi, Excell Seltzer, MD  sacubitril-valsartan (ENTRESTO) 97-103 MG Take 1 tablet by mouth 2 (two) times daily. 09/16/23   Poggi, Excell Seltzer, MD  Semaglutide, 2 MG/DOSE, (OZEMPIC, 2 MG/DOSE,) 8 MG/3ML SOPN Inject 2 mg into the skin once a week. Tuesdays 09/16/23   Poggi, Excell Seltzer, MD  spironolactone (ALDACTONE) 25 MG tablet Take 1 tablet by mouth once daily 09/09/23   Clarisa Kindred A, FNP  TALTZ 80 MG/ML SOAJ Inject 3 mLs into the skin every 28 (twenty-eight) days. 02/10/23   [provider]  torsemide (DEMADEX) 20 MG tablet Take 1 tablet (20 mg total) by mouth every morning. 09/16/23   Poggi, Excell Seltzer, MD  umeclidinium-vilanterol (ANORO ELLIPTA) 62.5-25 MCG/ACT AEPB Inhale 1 puff into the lungs every morning. 09/16/23   Poggi, Excell Seltzer, MD    Physical Exam: Vitals:   10/22/23 1231 10/22/23 1245 10/22/23 1500 10/22/23 1606  BP:   109/60 135/81  Pulse:   88 90  Resp: 18 16 (!) 21 20  Temp:    97.6 F (36.4 C)  TempSrc:    Oral  SpO2:   96% 98%  Weight:      Height:       Constitutional: appears older than chronological age, chronically ill, mildly uncomfortable Eyes: PERRL, lids and conjunctivae normal ENMT: Mucous membranes are moist. Posterior pharynx clear of any exudate or lesions. Age-appropriate dentition.  Hearing appropriate Neck: normal, supple, no masses, no thyromegaly Respiratory: clear to auscultation bilaterally, no wheezing, no crackles.  Increased respiratory effort. No accessory muscle use.  Nasal cannula in place Cardiovascular: Regular rate and rhythm, no murmurs / rubs /  gallops. No extremity edema. 2+ pedal pulses. No carotid bruits.  Abdomen: Obese abdomen, + bilateral upper abdominal and epigastric tenderness with palpation tenderness, no masses palpated, no hepatosplenomegaly. Bowel sounds positive.  Musculoskeletal: no clubbing / cyanosis. No joint deformity upper and lower extremities. Good ROM, no contractures, no atrophy. Normal muscle tone.  Skin: no rashes, lesions, ulcers. No induration Neurologic: Sensation intact. Strength 5/5 in all 4.  Psychiatric: Normal judgment and insight. Alert and oriented x 3.  Depressed mood.   EKG: independently reviewed, showing sinus rhythm with rate of 98, QTc 455  Chest x-ray on Admission: I personally reviewed and I agree with radiologist reading as below.  DG Chest 2 View Result Date: 10/22/2023 CLINICAL DATA:  Cough, shortness of breath. EXAM: CHEST - 2 VIEW COMPARISON:  July 22, 2023. FINDINGS: Stable cardiomediastinal silhouette. Both lungs are clear. The visualized skeletal structures are unremarkable. IMPRESSION: No active cardiopulmonary disease. Electronically Signed   By: Lupita Raider M.D.   On: 10/22/2023 14:12   Labs on Admission: I have personally reviewed following labs  CBC: Recent Labs  Lab 10/22/23 1109  WBC 5.5  HGB 14.8  HCT 45.8  MCV 79.7*  PLT 333   Basic Metabolic Panel: Recent Labs  Lab 10/22/23 1109  NA 132*  K 4.4  CL 101  CO2 21*  GLUCOSE 208*  BUN 25*  CREATININE 1.35*  CALCIUM 8.9   GFR: Estimated Creatinine Clearance: 79.3 mL/min (A) (by C-G formula based on SCr of 1.35 mg/dL (H)).  Urine analysis:    Component Value Date/Time   COLORURINE YELLOW (A) 01/17/2022 1953   APPEARANCEUR CLEAR (A) 01/17/2022 1953   APPEARANCEUR Clear 03/08/2020 0951   LABSPEC 1.012 01/17/2022 1953   LABSPEC 1.019 11/12/2012 1713   PHURINE 5.0 01/17/2022 1953   GLUCOSEU >=500 (A) 01/17/2022 1953   GLUCOSEU Negative 11/12/2012 1713   HGBUR NEGATIVE 01/17/2022 1953   BILIRUBINUR  NEGATIVE 01/17/2022 1953   BILIRUBINUR Negative 03/08/2020 0951   BILIRUBINUR Negative 11/12/2012 1713   KETONESUR NEGATIVE 01/17/2022 1953   PROTEINUR NEGATIVE 01/17/2022 1953   NITRITE NEGATIVE 01/17/2022 1953   LEUKOCYTESUR NEGATIVE 01/17/2022 1953   LEUKOCYTESUR Negative 11/12/2012 1713   This document was prepared using Dragon Voice Recognition software and may include unintentional dictation errors.  Dr. Sedalia Muta [facility]  If 7PM-7AM, please contact overnight-coverage provider If 7AM-7PM, please contact day attending provider [email or web address]  10/22/2023, 6:01 PM

## 2023-10-22 NOTE — Plan of Care (Addendum)
 Patient  is admitted from ED via bed in stable condition. He is oxygen 2 lit/min via nasal cannula. Denies any pain. Plan of care ongoing.   Problem: Education: Goal: Ability to describe self-care measures that may prevent or decrease complications (Diabetes Survival Skills Education) will improve Outcome: Progressing Goal: Individualized Educational Video(s) Outcome: Progressing   Problem: Coping: Goal: Ability to adjust to condition or change in health will improve Outcome: Progressing   Problem: Fluid Volume: Goal: Ability to maintain a balanced intake and output will improve Outcome: Progressing   Problem: Metabolic: Goal: Ability to maintain appropriate glucose levels will improve Outcome: Progressing   Problem: Nutritional: Goal: Maintenance of adequate nutrition will improve Outcome: Progressing Goal: Progress toward achieving an optimal weight will improve Outcome: Progressing

## 2023-10-22 NOTE — Assessment & Plan Note (Signed)
 Home Xarelto resumed on admission

## 2023-10-22 NOTE — Progress Notes (Signed)
 PHARMACIST - PHYSICIAN COMMUNICATION  CONCERNING:  Enoxaparin (Lovenox) for DVT Prophylaxis    RECOMMENDATION: Patient was prescribed enoxaprin 40mg  q24 hours for VTE prophylaxis.   Filed Weights   10/22/23 1049  Weight: 111.1 kg (245 lb)    Body mass index is 34.17 kg/m.  Estimated Creatinine Clearance: 79.3 mL/min (A) (by C-G formula based on SCr of 1.35 mg/dL (H)).   Based on [facility] policy patient is candidate for enoxaparin 0.5mg /kg TBW SQ every 24 hours based on BMI being >30.   DESCRIPTION: Pharmacy has adjusted enoxaparin dose per [facility] policy.  Patient is now receiving enoxaparin 55 mg every 24 hours    Barrie Folk, PharmD Clinical Pharmacist  10/22/2023 2:42 PM

## 2023-10-22 NOTE — ED Notes (Signed)
Patient transferred to Xray

## 2023-10-22 NOTE — ED Notes (Signed)
 2L O2 applied to patient for comfort.

## 2023-10-23 ENCOUNTER — Other Ambulatory Visit: Payer: Self-pay

## 2023-10-23 DIAGNOSIS — Z794 Long term (current) use of insulin: Secondary | ICD-10-CM | POA: Diagnosis not present

## 2023-10-23 DIAGNOSIS — R0602 Shortness of breath: Secondary | ICD-10-CM | POA: Diagnosis not present

## 2023-10-23 DIAGNOSIS — Z7901 Long term (current) use of anticoagulants: Secondary | ICD-10-CM | POA: Diagnosis not present

## 2023-10-23 DIAGNOSIS — Z7985 Long-term (current) use of injectable non-insulin antidiabetic drugs: Secondary | ICD-10-CM | POA: Diagnosis not present

## 2023-10-23 DIAGNOSIS — N189 Chronic kidney disease, unspecified: Secondary | ICD-10-CM | POA: Diagnosis not present

## 2023-10-23 DIAGNOSIS — F339 Major depressive disorder, recurrent, unspecified: Secondary | ICD-10-CM | POA: Diagnosis not present

## 2023-10-23 DIAGNOSIS — Z6834 Body mass index (BMI) 34.0-34.9, adult: Secondary | ICD-10-CM | POA: Diagnosis not present

## 2023-10-23 DIAGNOSIS — J441 Chronic obstructive pulmonary disease with (acute) exacerbation: Secondary | ICD-10-CM | POA: Diagnosis not present

## 2023-10-23 DIAGNOSIS — I4891 Unspecified atrial fibrillation: Secondary | ICD-10-CM | POA: Diagnosis not present

## 2023-10-23 DIAGNOSIS — Z8616 Personal history of COVID-19: Secondary | ICD-10-CM | POA: Diagnosis not present

## 2023-10-23 DIAGNOSIS — I428 Other cardiomyopathies: Secondary | ICD-10-CM | POA: Diagnosis not present

## 2023-10-23 DIAGNOSIS — I130 Hypertensive heart and chronic kidney disease with heart failure and stage 1 through stage 4 chronic kidney disease, or unspecified chronic kidney disease: Secondary | ICD-10-CM | POA: Diagnosis not present

## 2023-10-23 DIAGNOSIS — I340 Nonrheumatic mitral (valve) insufficiency: Secondary | ICD-10-CM | POA: Diagnosis not present

## 2023-10-23 DIAGNOSIS — R059 Cough, unspecified: Secondary | ICD-10-CM | POA: Diagnosis not present

## 2023-10-23 DIAGNOSIS — E1169 Type 2 diabetes mellitus with other specified complication: Secondary | ICD-10-CM | POA: Diagnosis not present

## 2023-10-23 DIAGNOSIS — Z8249 Family history of ischemic heart disease and other diseases of the circulatory system: Secondary | ICD-10-CM | POA: Diagnosis not present

## 2023-10-23 DIAGNOSIS — I5022 Chronic systolic (congestive) heart failure: Secondary | ICD-10-CM | POA: Diagnosis not present

## 2023-10-23 DIAGNOSIS — Z7982 Long term (current) use of aspirin: Secondary | ICD-10-CM | POA: Diagnosis not present

## 2023-10-23 DIAGNOSIS — E1122 Type 2 diabetes mellitus with diabetic chronic kidney disease: Secondary | ICD-10-CM | POA: Diagnosis not present

## 2023-10-23 DIAGNOSIS — L4050 Arthropathic psoriasis, unspecified: Secondary | ICD-10-CM | POA: Diagnosis not present

## 2023-10-23 DIAGNOSIS — K7581 Nonalcoholic steatohepatitis (NASH): Secondary | ICD-10-CM | POA: Diagnosis not present

## 2023-10-23 DIAGNOSIS — E785 Hyperlipidemia, unspecified: Secondary | ICD-10-CM | POA: Diagnosis not present

## 2023-10-23 DIAGNOSIS — D84821 Immunodeficiency due to drugs: Secondary | ICD-10-CM | POA: Diagnosis not present

## 2023-10-23 DIAGNOSIS — Z1152 Encounter for screening for COVID-19: Secondary | ICD-10-CM | POA: Diagnosis not present

## 2023-10-23 DIAGNOSIS — E662 Morbid (severe) obesity with alveolar hypoventilation: Secondary | ICD-10-CM | POA: Diagnosis not present

## 2023-10-23 DIAGNOSIS — Z79899 Other long term (current) drug therapy: Secondary | ICD-10-CM | POA: Diagnosis not present

## 2023-10-23 LAB — BASIC METABOLIC PANEL
Anion gap: 13 (ref 5–15)
BUN: 27 mg/dL — ABNORMAL HIGH (ref 6–20)
CO2: 23 mmol/L (ref 22–32)
Calcium: 9.3 mg/dL (ref 8.9–10.3)
Chloride: 100 mmol/L (ref 98–111)
Creatinine, Ser: 0.91 mg/dL (ref 0.61–1.24)
GFR, Estimated: >60 mL/min (ref >60)
Glucose, Bld: 238 mg/dL — ABNORMAL HIGH (ref 70–99)
Potassium: 4.1 mmol/L (ref 3.5–5.1)
Sodium: 136 mmol/L (ref 135–145)

## 2023-10-23 LAB — CBC
HCT: 44.5 % (ref 39.0–52.0)
Hemoglobin: 14.6 g/dL (ref 13.0–17.0)
MCH: 25.8 pg — ABNORMAL LOW (ref 26.0–34.0)
MCHC: 32.8 g/dL (ref 30.0–36.0)
MCV: 78.8 fL — ABNORMAL LOW (ref 80.0–100.0)
Platelets: 329 10*3/uL (ref 150–400)
RBC: 5.65 MIL/uL (ref 4.22–5.81)
RDW: 14.4 % (ref 11.5–15.5)
WBC: 6.4 10*3/uL (ref 4.0–10.5)
nRBC: 0 % (ref 0.0–0.2)

## 2023-10-23 LAB — GLUCOSE, CAPILLARY
Glucose-Capillary: 225 mg/dL — ABNORMAL HIGH (ref 70–99)
Glucose-Capillary: 248 mg/dL — ABNORMAL HIGH (ref 70–99)
Glucose-Capillary: 320 mg/dL — ABNORMAL HIGH (ref 70–99)
Glucose-Capillary: 311 mg/dL — ABNORMAL HIGH (ref 70–99)

## 2023-10-23 MED ORDER — GUAIFENESIN ER 600 MG PO TB12
600.0000 mg | ORAL_TABLET | Freq: Two times a day (BID) | ORAL | Status: DC
  Administered 2023-10-23 – 2023-10-24 (×3): 600 mg via ORAL
  Filled 2023-10-23 (×3): qty 1

## 2023-10-23 MED ORDER — OSELTAMIVIR PHOSPHATE 75 MG PO CAPS
75.0000 mg | ORAL_CAPSULE | Freq: Two times a day (BID) | ORAL | Status: DC
  Administered 2023-10-23 – 2023-10-24 (×3): 75 mg via ORAL
  Filled 2023-10-23 (×3): qty 1

## 2023-10-23 NOTE — Progress Notes (Signed)
 Introduced patient to role of Statistician. Intake questions completed.  Patient states he currently lives with his father and has for about 7 years. He is in the process of looking for a rental he can afford, however. Would like assistance with obtaining housing authority application. Application provided.  Patient drives to/from appointment. Does not currently use O2, but does require CPAP at night.   Patient receives $1774/mo in SSDI income. 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 For fun he enjoys fishing.  Patient denies any drug or alcohol use. He reports a history of smoking, but states he hasn't smoked in 3-4 years.   Patient reports current PCP and Pulmonologist.   Denies any other SDOH needs at present time.  Will continue to follow along. Patient encouraged to reach out to nurse navigator with any questions or concerns.

## 2023-10-23 NOTE — Discharge Instructions (Signed)
 Your nurse navigator, Liandro Thelin, may be reached at [phone]

## 2023-10-23 NOTE — Inpatient Diabetes Management (Signed)
 Inpatient Diabetes Program Recommendations  AACE/ADA: New Consensus Statement on Inpatient Glycemic Control (2015)  Target Ranges:  Prepandial:   less than 140 mg/dL      Peak postprandial:   less than 180 mg/dL (1-2 hours)      Critically ill patients:  140 - 180 mg/dL   Lab Results  Component Value Date   GLUCAP 248 (H) 10/23/2023   HGBA1C 6.6 (A) 07/11/2023    Review of Glycemic Control  Latest Reference Range & Units 10/22/23 17:15 10/22/23 21:39 10/23/23 07:51 10/23/23 11:07  Glucose-Capillary 70 - 99 mg/dL 469 (H) 629 (H) 528 (H) 248 (H)  (H): Data is abnormally high  Diabetes history: DM2 Outpatient Diabetes medications:  Farxiga 10 mg every day Basaglar 36 units every day Ozempic 2 mg weekly Humalog 10 units TID Freestyle Libre 3 Current orders for Inpatient glycemic control:  Lantus 36 units at bedtime Novolog 0-15 units TID and 0-5 units at bedtime Prednisone 40 mg Q12H  Inpatient Diabetes Program Recommendations:    Postprandials elevated.  Might consider:  Novolog 3 units TID with meals   Will continue to follow while inpatient.  Thank you, Gabriel Sellar, MSN, CDCES Diabetes Coordinator Inpatient Diabetes Program [phone] (team pager from 8a-5p)

## 2023-10-23 NOTE — Progress Notes (Signed)
 Dr Mayford Knife and RN Charline Bills made aware that tele monitoring reports a 7 beat run of Vtach, pt asymptomatic, no complaints, per MD continue to monitor

## 2023-10-23 NOTE — Plan of Care (Signed)
  Problem: Education: Goal: Ability to describe self-care measures that may prevent or decrease complications (Diabetes Survival Skills Education) will improve Outcome: Progressing   Problem: Health Behavior/Discharge Planning: Goal: Ability to identify and utilize available resources and services will improve Outcome: Progressing   Problem: Skin Integrity: Goal: Risk for impaired skin integrity will decrease Outcome: Progressing   Problem: Clinical Measurements: Goal: Ability to maintain clinical measurements within normal limits will improve Outcome: Progressing

## 2023-10-23 NOTE — Progress Notes (Signed)
 PROGRESS NOTE    Gabriel [name].  UEA:[id number] DOB: 07/16/1971 DOA: 10/22/2023 PCP: Sallee Provencal, FNP   Assessment & Plan:   Principal Problem:   COPD exacerbation (HCC) Active Problems:   Chronic obstructive pulmonary disease (COPD) (HCC)   Coronary artery disease, non-occlusive   History of pulmonary embolism   ICD (implantable cardioverter-defibrillator) in place   NICM (nonischemic cardiomyopathy) (HCC)   Type 2 diabetes mellitus with hyperlipidemia (HCC)   Type II diabetes mellitus with renal manifestations (HCC)   OSA on CPAP   Atherosclerotic heart disease of native coronary artery with other forms of angina pectoris (HCC)   Long term current use of anticoagulant   Hyperlipidemia associated with type 2 diabetes mellitus (HCC)   Depression, recurrent (HCC)   CPAP (continuous positive airway pressure) dependence   Class 1 obesity with alveolar hypoventilation, serious comorbidity, and body mass index (BMI) of 34.0 to 34.9 in adult (HCC)   Cough  Assessment and Plan: COPD exacerbation: likely secondary to influenza. Continue on bronchodilators, steroids & encourage incentive spirometry   Influenza: started on tamiflu. Droplet precautions   Hx of pulmonary embolism: continue on home dose of xarelto    OSA: CPAP qhs  Depression: recurrent. Severity unknown. Continue on home dose of fluoxetin    HLD: continue on statin    Hx of CAD: continue on coreg, entresto, aldactone, statine   Obesity: BMI 34.1. Would benefit from weight loss   DVT prophylaxis: xarelto  Code Status: full  Family Communication:  Disposition Plan: likely d/c back home.  Level of care: Telemetry Medical  Status is: Inpatient Remains inpatient appropriate because: severity of illness    Consultants:    Procedures:   Antimicrobials:    Subjective: Pt c/o shortness of breath   Objective: Vitals:   10/22/23 1935 10/23/23 0440 10/23/23 0749 10/23/23 0807  BP: 116/67 117/63  132/81   Pulse: 90 84 84   Resp: (!) 24 19 18    Temp: 98.3 F (36.8 C) 98 F (36.7 C) 97.8 F (36.6 C)   TempSrc:  Axillary    SpO2: 95% 95% 95% 96%  Weight:      Height:       No intake or output data in the 24 hours ending 10/23/23 0838 Filed Weights   10/22/23 1049  Weight: 111.1 kg    Examination:  General exam: Appears calm and comfortable  Respiratory system: diminished breath sounds b/l  Cardiovascular system: S1 & S2 +. No rubs, gallops or clicks.  Gastrointestinal system: Abdomen is obese, soft and nontender.  Normal bowel sounds heard. Central nervous system: Alert and oriented. Moves all extremities Psychiatry: Judgement and insight appear normal. Mood & affect appropriate.     Data Reviewed: I have personally reviewed following labs and imaging studies  CBC: Recent Labs  Lab 10/22/23 1109 10/23/23 0458  WBC 5.5 6.4  HGB 14.8 14.6  HCT 45.8 44.5  MCV 79.7* 78.8*  PLT 333 329   Basic Metabolic Panel: Recent Labs  Lab 10/22/23 1109 10/23/23 0458  NA 132* 136  K 4.4 4.1  CL 101 100  CO2 21* 23  GLUCOSE 208* 238*  BUN 25* 27*  CREATININE 1.35* 0.91  CALCIUM 8.9 9.3   GFR: Estimated Creatinine Clearance: 117.6 mL/min (by C-G formula based on SCr of 0.91 mg/dL). Liver Function Tests: No results for input(s): "AST", "ALT", "ALKPHOS", "BILITOT", "PROT", "ALBUMIN" in the last 168 hours. No results for input(s): "LIPASE", "AMYLASE" in the last 168  hours. No results for input(s): "AMMONIA" in the last 168 hours. Coagulation Profile: No results for input(s): "INR", "PROTIME" in the last 168 hours. Cardiac Enzymes: No results for input(s): "CKTOTAL", "CKMB", "CKMBINDEX", "TROPONINI" in the last 168 hours. BNP (last 3 results) No results for input(s): "PROBNP" in the last 8760 hours. HbA1C: No results for input(s): "HGBA1C" in the last 72 hours. CBG: Recent Labs  Lab 10/22/23 1715 10/22/23 2139 10/23/23 0751  GLUCAP 226* 341* 225*   Lipid  Profile: No results for input(s): "CHOL", "HDL", "LDLCALC", "TRIG", "CHOLHDL", "LDLDIRECT" in the last 72 hours. Thyroid Function Tests: No results for input(s): "TSH", "T4TOTAL", "FREET4", "T3FREE", "THYROIDAB" in the last 72 hours. Anemia Panel: No results for input(s): "VITAMINB12", "FOLATE", "FERRITIN", "TIBC", "IRON", "RETICCTPCT" in the last 72 hours. Sepsis Labs: No results for input(s): "PROCALCITON", "LATICACIDVEN" in the last 168 hours.  Recent Results (from the past 240 hours)  MRSA Next Gen by PCR, Nasal     Status: None   Collection Time: 10/22/23  2:50 PM   Specimen: Nasal Mucosa; Nasal Swab  Result Value Ref Range Status   MRSA by PCR Next Gen NOT DETECTED NOT DETECTED Final    Comment: (NOTE) The GeneXpert MRSA Assay (FDA approved for NASAL specimens only), is one component of a comprehensive MRSA colonization surveillance program. It is not intended to diagnose MRSA infection nor to guide or monitor treatment for MRSA infections. Test performance is not FDA approved in patients less than 104 years old. Performed at [facility], [address] [place], [place] [address]   Respiratory (~20 pathogens) panel by PCR     Status: Abnormal   Collection Time: 10/22/23  2:50 PM   Specimen: Nasal Mucosa; Respiratory  Result Value Ref Range Status   Adenovirus NOT DETECTED NOT DETECTED Final   Coronavirus 229E NOT DETECTED NOT DETECTED Final    Comment: (NOTE) The Coronavirus on the Respiratory Panel, DOES NOT test for the novel  Coronavirus (2019 nCoV)    Coronavirus HKU1 NOT DETECTED NOT DETECTED Final   Coronavirus NL63 NOT DETECTED NOT DETECTED Final   Coronavirus OC43 NOT DETECTED NOT DETECTED Final   Metapneumovirus NOT DETECTED NOT DETECTED Final   Rhinovirus / Enterovirus NOT DETECTED NOT DETECTED Final   Influenza A H1 2009 DETECTED (A) NOT DETECTED Final   Influenza B NOT DETECTED NOT DETECTED Final   Parainfluenza Virus 1 NOT DETECTED NOT DETECTED  Final   Parainfluenza Virus 2 NOT DETECTED NOT DETECTED Final   Parainfluenza Virus 3 NOT DETECTED NOT DETECTED Final   Parainfluenza Virus 4 NOT DETECTED NOT DETECTED Final   Respiratory Syncytial Virus NOT DETECTED NOT DETECTED Final   Bordetella pertussis NOT DETECTED NOT DETECTED Final   Bordetella Parapertussis NOT DETECTED NOT DETECTED Final   Chlamydophila pneumoniae NOT DETECTED NOT DETECTED Final   Mycoplasma pneumoniae NOT DETECTED NOT DETECTED Final    Comment: Performed at [facility], [address] [place], [place] [address]  SARS Coronavirus 2 by RT PCR (hospital order, performed in [facility] hospital lab) *cepheid single result test* Nasal Mucosa     Status: None   Collection Time: 10/22/23  2:50 PM   Specimen: Nasal Mucosa; Nasal Swab  Result Value Ref Range Status   SARS Coronavirus 2 by RT PCR NEGATIVE NEGATIVE Final    Comment: (NOTE) SARS-CoV-2 target nucleic acids are NOT DETECTED.  The SARS-CoV-2 RNA is generally detectable in upper and lower respiratory specimens during the acute phase of infection.  The lowest concentration of SARS-CoV-2 viral copies this assay can detect is 250 copies / mL. A negative result does not preclude SARS-CoV-2 infection and should not be used as the sole basis for treatment or other patient management decisions.  A negative result may occur with improper specimen collection / handling, submission of specimen other than nasopharyngeal swab, presence of viral mutation(s) within the areas targeted by this assay, and inadequate number of viral copies (<250 copies / mL). A negative result must be combined with clinical observations, patient history, and epidemiological information.  Fact Sheet for Patients:   RoadLapTop.co.za  Fact Sheet for Healthcare Providers: http://kim-miller.com/  This test is not yet approved or  cleared by the [place] FDA and has been authorized for  detection and/or diagnosis of SARS-CoV-2 by FDA under an Emergency Use Authorization (EUA).  This EUA will remain in effect (meaning this test can be used) for the duration of the COVID-19 declaration under Section 564(b)(1) of the Act, 21 U.S.C. section 360bbb-3(b)(1), unless the authorization is terminated or revoked sooner.  Performed at [facility], [address], [place], [place] [address]          Radiology Studies: DG Chest 2 View Result Date: 10/22/2023 CLINICAL DATA:  Cough, shortness of breath. EXAM: CHEST - 2 VIEW COMPARISON:  July 22, 2023. FINDINGS: Stable cardiomediastinal silhouette. Both lungs are clear. The visualized skeletal structures are unremarkable. IMPRESSION: No active cardiopulmonary disease. Electronically Signed   By: Lupita Raider M.D.   On: 10/22/2023 14:12        Scheduled Meds:  allopurinol  300 mg Oral BH-q7a   aspirin EC  81 mg Oral BH-q7a   carvedilol  12.5 mg Oral BID   dapagliflozin propanediol  10 mg Oral Daily   fenofibrate  160 mg Oral QHS   FLUoxetine  20 mg Oral QPM   insulin aspart  0-15 Units Subcutaneous TID WC   insulin aspart  0-5 Units Subcutaneous QHS   insulin glargine  36 Units Subcutaneous QHS   ipratropium-albuterol  3 mL Nebulization QID   isosorbide mononitrate  15 mg Oral BH-q7a   lidocaine  1-2 patch Transdermal Q24H   methylPREDNISolone (SOLU-MEDROL) injection  40 mg Intravenous Q12H   Followed by   Melene Muller ON 10/24/2023] predniSONE  40 mg Oral Q breakfast   pantoprazole  80 mg Oral QAC breakfast   rivaroxaban  20 mg Oral Q supper   rosuvastatin  20 mg Oral QHS   sacubitril-valsartan  1 tablet Oral BID   spironolactone  25 mg Oral Daily   torsemide  20 mg Oral BH-q7a   umeclidinium-vilanterol  1 puff Inhalation BH-q7a   Continuous Infusions:   LOS: 0 days      Charise Killian, MD [facility] Pager [phone]  If 7PM-7AM, please contact night-coverage [email or web address] 10/23/2023,  8:38 AM

## 2023-10-24 DIAGNOSIS — J441 Chronic obstructive pulmonary disease with (acute) exacerbation: Secondary | ICD-10-CM | POA: Diagnosis not present

## 2023-10-24 LAB — BASIC METABOLIC PANEL
Anion gap: 12 (ref 5–15)
BUN: 58 mg/dL — ABNORMAL HIGH (ref 6–20)
CO2: 23 mmol/L (ref 22–32)
Calcium: 9.2 mg/dL (ref 8.9–10.3)
Chloride: 96 mmol/L — ABNORMAL LOW (ref 98–111)
Creatinine, Ser: 1.56 mg/dL — ABNORMAL HIGH (ref 0.61–1.24)
GFR, Estimated: 52 mL/min — ABNORMAL LOW (ref >60)
Glucose, Bld: 267 mg/dL — ABNORMAL HIGH (ref 70–99)
Potassium: 4.4 mmol/L (ref 3.5–5.1)
Sodium: 131 mmol/L — ABNORMAL LOW (ref 135–145)

## 2023-10-24 LAB — GLUCOSE, CAPILLARY
Glucose-Capillary: 226 mg/dL — ABNORMAL HIGH (ref 70–99)
Glucose-Capillary: 221 mg/dL — ABNORMAL HIGH (ref 70–99)
Glucose-Capillary: 223 mg/dL — ABNORMAL HIGH (ref 70–99)

## 2023-10-24 MED ORDER — MORPHINE SULFATE (PF) 2 MG/ML IV SOLN
1.0000 mg | INTRAVENOUS | Status: DC | PRN
  Administered 2023-10-24: 1 mg via INTRAVENOUS
  Filled 2023-10-24: qty 1

## 2023-10-24 MED ORDER — OXYCODONE-ACETAMINOPHEN 5-325 MG PO TABS: 1.0000 | ORAL_TABLET | Freq: Four times a day (QID) | ORAL | Status: DC | PRN

## 2023-10-24 MED ORDER — PREDNISONE 20 MG PO TABS: 40.0000 mg | ORAL_TABLET | Freq: Every day | ORAL | 0 refills | Status: AC

## 2023-10-24 MED ORDER — OSELTAMIVIR PHOSPHATE 75 MG PO CAPS: 75.0000 mg | ORAL_CAPSULE | Freq: Two times a day (BID) | ORAL | 0 refills | Status: AC

## 2023-10-24 MED ORDER — LOPERAMIDE HCL 2 MG PO CAPS
4.0000 mg | ORAL_CAPSULE | Freq: Once | ORAL | Status: AC
  Administered 2023-10-24: 4 mg via ORAL
  Filled 2023-10-24: qty 2

## 2023-10-24 NOTE — Progress Notes (Signed)
 Patient discharging home, all discharge education and instructions provided and questions answered. All belongings sent with patient and IV removed.

## 2023-10-24 NOTE — Inpatient Diabetes Management (Signed)
 Inpatient Diabetes Program Recommendations  AACE/ADA: New Consensus Statement on Inpatient Glycemic Control (2015)  Target Ranges:  Prepandial:   less than 140 mg/dL      Peak postprandial:   less than 180 mg/dL (1-2 hours)      Critically ill patients:  140 - 180 mg/dL   Lab Results  Component Value Date   GLUCAP 221 (H) 10/24/2023   HGBA1C 6.6 (A) 07/11/2023    Review of Glycemic Control  Latest Reference Range & Units 10/23/23 07:51 10/23/23 11:07 10/23/23 16:18 10/23/23 19:45 10/24/23 05:14 10/24/23 08:02  Glucose-Capillary 70 - 99 mg/dL 409 (H) 811 (H) 914 (H) 311 (H) 226 (H) 221 (H)  (H): Data is abnormally high  Diabetes history: DM2 Outpatient Diabetes medications:  Farxiga 10 mg every day Basaglar 36 units every day Ozempic 2 mg weekly Humalog 10 units TID Freestyle Libre 3 Current orders for Inpatient glycemic control:  Lantus 36 units at bedtime Novolog 0-15 units TID and 0-5 units at bedtime Prednisone 40 mg Q12H   Inpatient Diabetes Program Recommendations:     Postprandials elevated.  Might consider:   Novolog 4 units TID with meals    Will continue to follow while inpatient.   Thank you, Dulce Sellar, MSN, CDCES Diabetes Coordinator Inpatient Diabetes Program [phone] (team pager from 8a-5p)

## 2023-10-24 NOTE — Progress Notes (Signed)
 Rounded on patient. Patient provided with listing of some income-based apartments and apartments for disabled individuals to call for availability.

## 2023-10-24 NOTE — Plan of Care (Signed)
 Problem: Education: Goal: Ability to describe self-care measures that may prevent or decrease complications (Diabetes Survival Skills Education) will improve Outcome: Progressing Goal: Individualized Educational Video(s) Outcome: Progressing   Problem: Coping: Goal: Ability to adjust to condition or change in health will improve Outcome: Progressing   Problem: Fluid Volume: Goal: Ability to maintain a balanced intake and output will improve Outcome: Progressing   Problem: Health Behavior/Discharge Planning: Goal: Ability to identify and utilize available resources and services will improve Outcome: Progressing Goal: Ability to manage health-related needs will improve Outcome: Progressing   Problem: Metabolic: Goal: Ability to maintain appropriate glucose levels will improve Outcome: Progressing   Problem: Nutritional: Goal: Maintenance of adequate nutrition will improve Outcome: Progressing Goal: Progress toward achieving an optimal weight will improve Outcome: Progressing   Problem: Skin Integrity: Goal: Risk for impaired skin integrity will decrease Outcome: Progressing   Problem: Tissue Perfusion: Goal: Adequacy of tissue perfusion will improve Outcome: Progressing   Problem: Education: Goal: Knowledge of General Education information will improve Description: Including pain rating scale, medication(s)/side effects and non-pharmacologic comfort measures Outcome: Progressing   Problem: Health Behavior/Discharge Planning: Goal: Ability to manage health-related needs will improve Outcome: Progressing   Problem: Clinical Measurements: Goal: Ability to maintain clinical measurements within normal limits will improve Outcome: Progressing Goal: Will remain free from infection Outcome: Progressing Goal: Diagnostic test results will improve Outcome: Progressing Goal: Respiratory complications will improve Outcome: Progressing Goal: Cardiovascular complication will  be avoided Outcome: Progressing   Problem: Activity: Goal: Risk for activity intolerance will decrease Outcome: Progressing   Problem: Nutrition: Goal: Adequate nutrition will be maintained Outcome: Progressing   Problem: Coping: Goal: Level of anxiety will decrease Outcome: Progressing   Problem: Elimination: Goal: Will not experience complications related to bowel motility Outcome: Progressing Goal: Will not experience complications related to urinary retention Outcome: Progressing   Problem: Pain Managment: Goal: General experience of comfort will improve and/or be controlled Outcome: Progressing   Problem: Safety: Goal: Ability to remain free from injury will improve Outcome: Progressing   Problem: Skin Integrity: Goal: Risk for impaired skin integrity will decrease Outcome: Progressing   Problem: Education: Goal: Knowledge of General Education information will improve Description: Including pain rating scale, medication(s)/side effects and non-pharmacologic comfort measures Outcome: Progressing   Problem: Health Behavior/Discharge Planning: Goal: Ability to manage health-related needs will improve Outcome: Progressing   Problem: Clinical Measurements: Goal: Ability to maintain clinical measurements within normal limits will improve Outcome: Progressing Goal: Will remain free from infection Outcome: Progressing Goal: Diagnostic test results will improve Outcome: Progressing Goal: Respiratory complications will improve Outcome: Progressing Goal: Cardiovascular complication will be avoided Outcome: Progressing   Problem: Activity: Goal: Risk for activity intolerance will decrease Outcome: Progressing   Problem: Nutrition: Goal: Adequate nutrition will be maintained Outcome: Progressing   Problem: Coping: Goal: Level of anxiety will decrease Outcome: Progressing   Problem: Elimination: Goal: Will not experience complications related to bowel  motility Outcome: Progressing Goal: Will not experience complications related to urinary retention Outcome: Progressing   Problem: Pain Managment: Goal: General experience of comfort will improve and/or be controlled Outcome: Progressing   Problem: Safety: Goal: Ability to remain free from injury will improve Outcome: Progressing   Problem: Skin Integrity: Goal: Risk for impaired skin integrity will decrease Outcome: Progressing   Problem: Education: Goal: Knowledge of disease or condition will improve Outcome: Progressing Goal: Knowledge of the prescribed therapeutic regimen will improve Outcome: Progressing Goal: Individualized Educational Video(s) Outcome: Progressing   Problem: Activity: Goal:  Ability to tolerate increased activity will improve Outcome: Progressing Goal: Will verbalize the importance of balancing activity with adequate rest periods Outcome: Progressing   Problem: Respiratory: Goal: Ability to maintain a clear airway will improve Outcome: Progressing Goal: Levels of oxygenation will improve Outcome: Progressing Goal: Ability to maintain adequate ventilation will improve Outcome: Progressing

## 2023-10-24 NOTE — Discharge Summary (Addendum)
 Physician Discharge Summary  Gabriel [name]. ZOX:[id number] DOB: [birth year] DOA: 10/22/2023  PCP: Sallee Provencal, FNP  Admit date: 10/22/2023 Discharge date: 10/24/2023  Admitted From: home  Disposition:  home   Recommendations for Outpatient Follow-up:  Follow up with PCP in 1-2 weeks   Home Health: no  Equipment/Devices:  Discharge Condition: stable  CODE STATUS: full  Diet recommendation: Heart Healthy   Brief/Interim Summary: HPI was taken from Dr. Sedalia Muta: Mr. 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   Vitals in the ED showed temperature of 98.1, respiration 24, heart rate 97, blood pressure 141/88, SpO2 of 97% on room air.   Serum sodium is 132, potassium 4.4, chloride 101, bicarb 21, BUN of 25, serum creatinine 1.35, EGFR greater than 60, nonfasting blood glucose 5.5, hemoglobin 14.8, platelets of 333.   BNP was 125.8.  High sensitive troponin is 51.   ED treatment: Fentanyl 50 mcg IV one-time dose, DuoNebs 2 treatments at 11:00, 1 more DuoNeb treatment at 1420, Solu-Medrol 125 mg IV one-time dose.   Per EDP, patient remains with increased work of breathing despite 3 treatments of DuoNeb's. ------------------------------------ At bedside, patient he is able to tell me his name, age, current location, current calendar year.    He denies recent trauma to his person. He reports difficulty getting sputum up. He has been coughing almost nonstop since Sunday. He reports bilateral upper abdominal and right sided breast pain worse everytime he coughs. He describes the pain as dull and worse with  coughing. He denies dysuria, hematuria, diarrhea, blood his stool, swelling in his legs.   He denies fever. He endorses chills.   Discharge Diagnoses:  Principal Problem:   COPD exacerbation (HCC) Active Problems:   Chronic obstructive pulmonary disease (COPD) (HCC)   Coronary artery disease, non-occlusive   History of pulmonary embolism   ICD (implantable cardioverter-defibrillator) in place   NICM (nonischemic cardiomyopathy) (HCC)   Type 2 diabetes mellitus with hyperlipidemia (HCC)   Type II diabetes mellitus with renal manifestations (HCC)   OSA on CPAP   Atherosclerotic heart disease of native coronary artery with other forms of angina pectoris (HCC)   Long term current use of anticoagulant   Hyperlipidemia associated with type 2 diabetes mellitus (HCC)   Depression, recurrent (HCC)   CPAP (continuous positive airway pressure) dependence   Class 1 obesity with alveolar hypoventilation, serious comorbidity, and body mass index (BMI) of 34.0 to 34.9 in adult (HCC)   Cough  COPD exacerbation: likely secondary to influenza. Continue on bronchodilators, steroids & encourage incentive spirometry   Influenza: continue on tamiflu. Droplet precautions  DM2: well controlled, HbA1c 6.6. Continue on glargine, SSI w/ accuchecks    Hx of pulmonary embolism: continue on home dose of xarelto    OSA: CPAP qhs  Depression: recurrent. Severity unknown. Continue on home dose of fluoxetine   HLD: continue on statin    Hx of CAD: continue on coreg, entresto, aldactone, statine   Obesity: BMI 34.1. Would benefit from weight loss   Discharge Instructions  Discharge Instructions     Diet - low sodium heart healthy   Complete by: As directed    Diet Carb Modified   Complete  by: As directed    Discharge instructions   Complete by: As directed    F/u w/ PCP in 1-2 weeks   Increase activity slowly   Complete by: As directed       Allergies as of 10/24/2023       Reactions    Metformin And Related Nausea And Vomiting   Zantac [ranitidine Hcl] Diarrhea, Nausea Only   Night sweats        Medication List     TAKE these medications    Accu-Chek Softclix Lancets lancets USE UP TO FOUR TIMES DAILY   albuterol 108 (90 Base) MCG/ACT inhaler Commonly known as: VENTOLIN HFA Inhale 2 puffs into the lungs every 4 (four) hours as needed for wheezing or shortness of breath.   allopurinol 300 MG tablet Commonly known as: ZYLOPRIM Take 300 mg by mouth every morning.   Anoro Ellipta 62.5-25 MCG/ACT Aepb Generic drug: umeclidinium-vilanterol Inhale 1 puff into the lungs every morning.   aspirin EC 81 MG tablet Take 81 mg by mouth every morning.   azelastine 0.1 % nasal spray Commonly known as: ASTELIN Place 2 sprays into both nostrils 2 (two) times daily. Use in each nostril as directed   Basaglar KwikPen 100 UNIT/ML Inject 36 Units into the skin at bedtime.   BD Pen Needle Nano 2nd Gen 32G X 4 MM Misc Generic drug: Insulin Pen Needle USE  AS DIRECTED AT BEDTIME   Insulin Pen Needle 32G X 4 MM Misc 1 each by Does not apply route in the morning, at noon, in the evening, and at bedtime.   blood glucose meter kit and supplies Kit Dispense based on patient and insurance preference. Use up to four times daily as directed.   butalbital-acetaminophen-caffeine 50-325-40 MG tablet Commonly known as: FIORICET Take 1 tablet by mouth every 4 (four) hours as needed for migraine.   carvedilol 12.5 MG tablet Commonly known as: COREG Take 1 tablet (12.5 mg total) by mouth 2 (two) times daily.   cetirizine 10 MG tablet Commonly known as: ZYRTEC Take 1 tablet (10 mg total) by mouth every morning.   colchicine 0.6 MG tablet Take 1 tablet (0.6 mg total) by mouth daily as needed (gout flare).   dapagliflozin propanediol 10 MG Tabs tablet Commonly known as: FARXIGA Take 1 tablet (10 mg total) by mouth daily.   Entresto 97-103 MG Generic drug:  sacubitril-valsartan Take 1 tablet by mouth 2 (two) times daily.   fenofibrate 145 MG tablet Commonly known as: TRICOR Take 1 tablet (145 mg total) by mouth every evening.   FLUoxetine 20 MG capsule Commonly known as: PROZAC Take 1 capsule (20 mg total) by mouth every evening.   fluticasone 50 MCG/ACT nasal spray Commonly known as: FLONASE Place 2 sprays into both nostrils daily as needed for allergies or rhinitis. Home med.   FreeStyle Libre 3 Sensor Misc PLACE 1 SENSOR ON TO THE SKIN EVERY 14 DAYS. USE TO CHECK BLOOD SUGAR CONTINUEOUSLY.   FreeStyle Libre 3 Sensor Misc 1 Application by Does not apply route every 14 (fourteen) days. Use to check blood sugar continuously.   FreeStyle Libre 3 Sensor Misc Apply sensor every 14 (fourteen) days to check blood sugar continuously.   Galcanezumab-gnlm 120 MG/ML Soaj Inject 120 mg into the skin. Monthly   insulin lispro 100 UNIT/ML injection Commonly known as: HUMALOG Inject 10 Units into the skin 3 (three) times daily before meals.   ipratropium-albuterol 0.5-2.5 (3) MG/3ML Soln Commonly known as: DUONEB Take  3 mLs by nebulization every 6 (six) hours as needed.   isosorbide mononitrate 30 MG 24 hr tablet Commonly known as: IMDUR Take 0.5 tablets (15 mg total) by mouth every morning.   meclizine 25 MG tablet Commonly known as: ANTIVERT Take 0.5 tablets (12.5 mg total) by mouth 3 (three) times daily as needed for dizziness.   metoCLOPramide 5 MG tablet Commonly known as: Reglan Take 1 tablet (5 mg total) by mouth every 8 (eight) hours as needed for nausea.   naltrexone 50 MG tablet Commonly known as: DEPADE Take 1 tablet (50 mg total) by mouth every morning.   nitroGLYCERIN 0.4 MG SL tablet Commonly known as: NITROSTAT Place 1 tablet (0.4 mg total) under the tongue every 5 (five) minutes x 3 doses as needed for chest pain.   omeprazole 40 MG capsule Commonly known as: PRILOSEC Take 1 capsule (40 mg total) by mouth  every morning.   oseltamivir 75 MG capsule Commonly known as: TAMIFLU Take 1 capsule (75 mg total) by mouth 2 (two) times daily for 4 days.   oxyCODONE 5 MG immediate release tablet Commonly known as: Roxicodone Take 1-2 tablets (5-10 mg total) by mouth every 4 (four) hours as needed for moderate pain (pain score 4-6) or severe pain (pain score 7-10).   Ozempic (2 MG/DOSE) 8 MG/3ML Sopn Generic drug: Semaglutide (2 MG/DOSE) Inject 2 mg into the skin once a week. Tuesdays   potassium chloride SA 20 MEQ tablet Commonly known as: KLOR-CON M Take 1 tablet (20 mEq total) by mouth every other day. AM   predniSONE 20 MG tablet Commonly known as: DELTASONE Take 2 tablets (40 mg total) by mouth daily with breakfast for 4 days. Start taking on: October 25, 2023   Rezdiffra 100 MG Tabs Generic drug: Resmetirom TAKE 1 TABLET BY MOUTH DAILY WITH OR WITHOUT FOOD   rivaroxaban 20 MG Tabs tablet Commonly known as: XARELTO Take 1 tablet (20 mg total) by mouth daily with supper.   rosuvastatin 20 MG tablet Commonly known as: Crestor Take 1 tablet (20 mg total) by mouth at bedtime.   spironolactone 25 MG tablet Commonly known as: ALDACTONE Take 1 tablet by mouth once daily   Taltz 80 MG/ML pen Generic drug: ixekizumab Inject 3 mLs into the skin every 28 (twenty-eight) days.   torsemide 20 MG tablet Commonly known as: DEMADEX Take 1 tablet (20 mg total) by mouth every morning.        Allergies  Allergen Reactions   Metformin And Related Nausea And Vomiting   Zantac [Ranitidine Hcl] Diarrhea and Nausea Only    Night sweats    Consultations:    Procedures/Studies: DG Chest 2 View Result Date: 10/22/2023 CLINICAL DATA:  Cough, shortness of breath. EXAM: CHEST - 2 VIEW COMPARISON:  July 22, 2023. FINDINGS: Stable cardiomediastinal silhouette. Both lungs are clear. The visualized skeletal structures are unremarkable. IMPRESSION: No active cardiopulmonary disease. Electronically  Signed   By: Lupita Raider M.D.   On: 10/22/2023 14:12   LONG TERM MONITOR (3-14 DAYS) Result Date: 10/21/2023 HR 56 to 214, average 89. 988 VT episodes, longest 55.1 seconds with an average rate of 100 bpm. 46 SVT episodes, longest 10.7 seconds with an average rate of 179 bpm. Occasional supraventricular ectopy, 1.3% Occasional ventricular ectopy, 2.5% Sheria Lang T. Lalla Brothers, MD, [facility], [facility] Cardiac Electrophysiology  (Echo, Carotid, EGD, Colonoscopy, ERCP)    Subjective: Pt c/o fatigue    Discharge Exam: Vitals:   10/24/23 0736 10/24/23 1120  BP: 94/61 (!) 96/58  Pulse: 69 72  Resp: 20 18  Temp: 97.6 F (36.4 C) 97.7 F (36.5 C)  SpO2: 94% 97%   Vitals:   10/24/23 0009 10/24/23 0511 10/24/23 0736 10/24/23 1120  BP: (!) 93/52 106/71 94/61 (!) 96/58  Pulse: 73 64 69 72  Resp: 19 19 20 18   Temp: (!) 97.4 F (36.3 C) (!) 97.4 F (36.3 C) 97.6 F (36.4 C) 97.7 F (36.5 C)  TempSrc:   Oral   SpO2: 97% 98% 94% 97%  Weight:      Height:        General: Pt is alert, awake, not in acute distress Cardiovascular: S1/S2 +, no rubs, no gallops Respiratory: decreased breath sounds b/l  Abdominal: Soft, NT, obese, bowel sounds + Extremities:  no cyanosis    The results of significant diagnostics from this hospitalization (including imaging, microbiology, ancillary and laboratory) are listed below for reference.     Microbiology: Recent Results (from the past 240 hours)  MRSA Next Gen by PCR, Nasal     Status: None   Collection Time: 10/22/23  2:50 PM   Specimen: Nasal Mucosa; Nasal Swab  Result Value Ref Range Status   MRSA by PCR Next Gen NOT DETECTED NOT DETECTED Final    Comment: (NOTE) The GeneXpert MRSA Assay (FDA approved for NASAL specimens only), is one component of a comprehensive MRSA colonization surveillance program. It is not intended to diagnose MRSA infection nor to guide or monitor treatment for MRSA infections. Test performance is not FDA approved in  patients less than 73 years old. Performed at [facility], [address] [place], [place] [address]   Respiratory (~20 pathogens) panel by PCR     Status: Abnormal   Collection Time: 10/22/23  2:50 PM   Specimen: Nasal Mucosa; Respiratory  Result Value Ref Range Status   Adenovirus NOT DETECTED NOT DETECTED Final   Coronavirus 229E NOT DETECTED NOT DETECTED Final    Comment: (NOTE) The Coronavirus on the Respiratory Panel, DOES NOT test for the novel  Coronavirus (2019 nCoV)    Coronavirus HKU1 NOT DETECTED NOT DETECTED Final   Coronavirus NL63 NOT DETECTED NOT DETECTED Final   Coronavirus OC43 NOT DETECTED NOT DETECTED Final   Metapneumovirus NOT DETECTED NOT DETECTED Final   Rhinovirus / Enterovirus NOT DETECTED NOT DETECTED Final   Influenza A H1 2009 DETECTED (A) NOT DETECTED Final   Influenza B NOT DETECTED NOT DETECTED Final   Parainfluenza Virus 1 NOT DETECTED NOT DETECTED Final   Parainfluenza Virus 2 NOT DETECTED NOT DETECTED Final   Parainfluenza Virus 3 NOT DETECTED NOT DETECTED Final   Parainfluenza Virus 4 NOT DETECTED NOT DETECTED Final   Respiratory Syncytial Virus NOT DETECTED NOT DETECTED Final   Bordetella pertussis NOT DETECTED NOT DETECTED Final   Bordetella Parapertussis NOT DETECTED NOT DETECTED Final   Chlamydophila pneumoniae NOT DETECTED NOT DETECTED Final   Mycoplasma pneumoniae NOT DETECTED NOT DETECTED Final    Comment: Performed at [facility], [address] [place], [place] [address]  SARS Coronavirus 2 by RT PCR (hospital order, performed in [facility] hospital lab) *cepheid single result test* Nasal Mucosa     Status: None   Collection Time: 10/22/23  2:50 PM   Specimen: Nasal Mucosa; Nasal Swab  Result Value Ref Range Status   SARS Coronavirus 2 by RT PCR NEGATIVE NEGATIVE Final    Comment: (NOTE) SARS-CoV-2 target nucleic acids are NOT DETECTED.  The SARS-CoV-2 RNA is generally detectable in upper and lower respiratory  specimens during the acute phase of infection. The lowest concentration of SARS-CoV-2 viral copies this assay can detect is 250 copies / mL. A negative result does not preclude SARS-CoV-2 infection and should not be used as the sole basis for treatment or other patient management decisions.  A negative result may occur with improper specimen collection / handling, submission of specimen other than nasopharyngeal swab, presence of viral mutation(s) within the areas targeted by this assay, and inadequate number of viral copies (<250 copies / mL). A negative result must be combined with clinical observations, patient history, and epidemiological information.  Fact Sheet for Patients:   RoadLapTop.co.za  Fact Sheet for Healthcare Providers: http://kim-miller.com/  This test is not yet approved or  cleared by the [place] FDA and has been authorized for detection and/or diagnosis of SARS-CoV-2 by FDA under an Emergency Use Authorization (EUA).  This EUA will remain in effect (meaning this test can be used) for the duration of the COVID-19 declaration under Section 564(b)(1) of the Act, 21 U.S.C. section 360bbb-3(b)(1), unless the authorization is terminated or revoked sooner.  Performed at [facility], [address] [place], [place] [address]      Labs: BNP (last 3 results) Recent Labs    07/08/23 0930 07/22/23 1445 10/22/23 1109  BNP 342.1* 454.3* 125.8*   Basic Metabolic Panel: Recent Labs  Lab 10/22/23 1109 10/23/23 0458 10/24/23 0915  NA 132* 136 131*  K 4.4 4.1 4.4  CL 101 100 96*  CO2 21* 23 23  GLUCOSE 208* 238* 267*  BUN 25* 27* 58*  CREATININE 1.35* 0.91 1.56*  CALCIUM 8.9 9.3 9.2   Liver Function Tests: No results for input(s): "AST", "ALT", "ALKPHOS", "BILITOT", "PROT", "ALBUMIN" in the last 168 hours. No results for input(s): "LIPASE", "AMYLASE" in the last 168 hours. No results for input(s):  "AMMONIA" in the last 168 hours. CBC: Recent Labs  Lab 10/22/23 1109 10/23/23 0458  WBC 5.5 6.4  HGB 14.8 14.6  HCT 45.8 44.5  MCV 79.7* 78.8*  PLT 333 329   Cardiac Enzymes: No results for input(s): "CKTOTAL", "CKMB", "CKMBINDEX", "TROPONINI" in the last 168 hours. BNP: Invalid input(s): "POCBNP" CBG: Recent Labs  Lab 10/23/23 1618 10/23/23 1945 10/24/23 0514 10/24/23 0802 10/24/23 1119  GLUCAP 320* 311* 226* 221* 223*   D-Dimer No results for input(s): "DDIMER" in the last 72 hours. Hgb A1c No results for input(s): "HGBA1C" in the last 72 hours. Lipid Profile No results for input(s): "CHOL", "HDL", "LDLCALC", "TRIG", "CHOLHDL", "LDLDIRECT" in the last 72 hours. Thyroid function studies No results for input(s): "TSH", "T4TOTAL", "T3FREE", "THYROIDAB" in the last 72 hours.  Invalid input(s): "FREET3" Anemia work up No results for input(s): "VITAMINB12", "FOLATE", "FERRITIN", "TIBC", "IRON", "RETICCTPCT" in the last 72 hours. Urinalysis    Component Value Date/Time   COLORURINE YELLOW (A) 01/17/2022 1953   APPEARANCEUR CLEAR (A) 01/17/2022 1953   APPEARANCEUR Clear 03/08/2020 0951   LABSPEC 1.012 01/17/2022 1953   LABSPEC 1.019 11/12/2012 1713   PHURINE 5.0 01/17/2022 1953   GLUCOSEU >=500 (A) 01/17/2022 1953   GLUCOSEU Negative 11/12/2012 1713   HGBUR NEGATIVE 01/17/2022 1953   BILIRUBINUR NEGATIVE 01/17/2022 1953   BILIRUBINUR Negative 03/08/2020 0951   BILIRUBINUR Negative 11/12/2012 1713   KETONESUR NEGATIVE 01/17/2022 1953   PROTEINUR NEGATIVE 01/17/2022 1953   NITRITE NEGATIVE 01/17/2022 1953   LEUKOCYTESUR NEGATIVE 01/17/2022 1953   LEUKOCYTESUR Negative 11/12/2012 1713  Sepsis Labs Recent Labs  Lab 10/22/23 1109 10/23/23 0458  WBC 5.5 6.4   Microbiology Recent Results (from the past 240 hours)  MRSA Next Gen by PCR, Nasal     Status: None   Collection Time: 10/22/23  2:50 PM   Specimen: Nasal Mucosa; Nasal Swab  Result Value Ref Range  Status   MRSA by PCR Next Gen NOT DETECTED NOT DETECTED Final    Comment: (NOTE) The GeneXpert MRSA Assay (FDA approved for NASAL specimens only), is one component of a comprehensive MRSA colonization surveillance program. It is not intended to diagnose MRSA infection nor to guide or monitor treatment for MRSA infections. Test performance is not FDA approved in patients less than 29 years old. Performed at [facility], [address] [place], [place] [address]   Respiratory (~20 pathogens) panel by PCR     Status: Abnormal   Collection Time: 10/22/23  2:50 PM   Specimen: Nasal Mucosa; Respiratory  Result Value Ref Range Status   Adenovirus NOT DETECTED NOT DETECTED Final   Coronavirus 229E NOT DETECTED NOT DETECTED Final    Comment: (NOTE) The Coronavirus on the Respiratory Panel, DOES NOT test for the novel  Coronavirus (2019 nCoV)    Coronavirus HKU1 NOT DETECTED NOT DETECTED Final   Coronavirus NL63 NOT DETECTED NOT DETECTED Final   Coronavirus OC43 NOT DETECTED NOT DETECTED Final   Metapneumovirus NOT DETECTED NOT DETECTED Final   Rhinovirus / Enterovirus NOT DETECTED NOT DETECTED Final   Influenza A H1 2009 DETECTED (A) NOT DETECTED Final   Influenza B NOT DETECTED NOT DETECTED Final   Parainfluenza Virus 1 NOT DETECTED NOT DETECTED Final   Parainfluenza Virus 2 NOT DETECTED NOT DETECTED Final   Parainfluenza Virus 3 NOT DETECTED NOT DETECTED Final   Parainfluenza Virus 4 NOT DETECTED NOT DETECTED Final   Respiratory Syncytial Virus NOT DETECTED NOT DETECTED Final   Bordetella pertussis NOT DETECTED NOT DETECTED Final   Bordetella Parapertussis NOT DETECTED NOT DETECTED Final   Chlamydophila pneumoniae NOT DETECTED NOT DETECTED Final   Mycoplasma pneumoniae NOT DETECTED NOT DETECTED Final    Comment: Performed at [facility], [address], [place], [place] [address]  SARS Coronavirus 2 by RT PCR (hospital order, performed in [facility] hospital lab)  *cepheid single result test* Nasal Mucosa     Status: None   Collection Time: 10/22/23  2:50 PM   Specimen: Nasal Mucosa; Nasal Swab  Result Value Ref Range Status   SARS Coronavirus 2 by RT PCR NEGATIVE NEGATIVE Final    Comment: (NOTE) SARS-CoV-2 target nucleic acids are NOT DETECTED.  The SARS-CoV-2 RNA is generally detectable in upper and lower respiratory specimens during the acute phase of infection. The lowest concentration of SARS-CoV-2 viral copies this assay can detect is 250 copies / mL. A negative result does not preclude SARS-CoV-2 infection and should not be used as the sole basis for treatment or other patient management decisions.  A negative result may occur with improper specimen collection / handling, submission of specimen other than nasopharyngeal swab, presence of viral mutation(s) within the areas targeted by this assay, and inadequate number of viral copies (<250 copies / mL). A negative result must be combined with clinical observations, patient history, and epidemiological information.  Fact Sheet for Patients:   RoadLapTop.co.za  Fact Sheet for Healthcare Providers: http://kim-miller.com/  This test is not yet approved or  cleared by the [place] FDA and has been authorized for detection  and/or diagnosis of SARS-CoV-2 by FDA under an Emergency Use Authorization (EUA).  This EUA will remain in effect (meaning this test can be used) for the duration of the COVID-19 declaration under Section 564(b)(1) of the Act, 21 U.S.C. section 360bbb-3(b)(1), unless the authorization is terminated or revoked sooner.  Performed at [facility], [address] [place], [place] [address]      Time coordinating discharge: Over 30 minutes  SIGNED:   Charise Killian, MD  [facility] 10/24/2023, 12:09 PM Pager   If 7PM-7AM, please contact night-coverage [email or web address]

## 2023-10-27 ENCOUNTER — Telehealth: Payer: Self-pay

## 2023-10-27 ENCOUNTER — Ambulatory Visit: Payer: Medicaid Other | Admitting: Gastroenterology

## 2023-10-27 NOTE — Transitions of Care (Post Inpatient/ED Visit) (Signed)
 10/27/2023  Name: Gabriel [name]. MRN: [id number] DOB: 07/23/1971  Today's TOC FU Call Status: Today's TOC FU Call Status:: Successful TOC FU Call Completed TOC FU Call Complete Date: 10/27/23 Patient's Name and Date of Birth confirmed.  Transition Care Management Follow-up Telephone Call Date of Discharge: 10/24/23 Discharge Facility: [facility]) Type of Discharge: Inpatient Admission Primary Inpatient Discharge Diagnosis:: Flu, COPD exacerbation How have you been since you were released from the hospital?: Worse (Still feels like he has the Flu. Has one more dose of Tamiflu) Any questions or concerns?: No  Items Reviewed: Did you receive and understand the discharge instructions provided?: Yes Medications obtained,verified, and reconciled?: Yes (Medications Reviewed) Any new allergies since your discharge?: No Dietary orders reviewed?: Yes Type of Diet Ordered:: Low sodium Heart Healty Do you have support at home?: No  Medications Reviewed Today: Medications Reviewed Today     Reviewed by Redge Gainer, RN (Case Manager) on 10/27/23 at 1330  Med List Status: <None>   Medication Order Taking? Sig Documenting Provider Last Dose Status Informant  Accu-Chek Softclix Lancets lancets [id number] No USE UP TO FOUR TIMES DAILY [provider] Taking Active Self  albuterol (VENTOLIN HFA) 108 (90 Base) MCG/ACT inhaler [id number] No Inhale 2 puffs into the lungs every 4 (four) hours as needed for wheezing or shortness of breath. Raechel Chute, MD Taking Active Self  allopurinol (ZYLOPRIM) 300 MG tablet [id number] No Take 300 mg by mouth every morning. [provider] Taking Active Self  aspirin EC 81 MG tablet [id number] No Take 81 mg by mouth every morning. [provider] Taking Active Self  azelastine (ASTELIN) 0.1 % nasal spray [id number] No Place 2 sprays into both nostrils 2 (two) times daily. Use in each nostril as directed  Sallee Provencal, FNP Taking Active Self  BD PEN NEEDLE NANO 2ND GEN 32G X 4 MM MISC [id number] No USE  AS DIRECTED AT BEDTIME Jacky Kindle, FNP Taking Active Self  blood glucose meter kit and supplies KIT [id number] No Dispense based on patient and insurance preference. Use up to four times daily as directed. Jacky Kindle, FNP Taking Active Self  butalbital-acetaminophen-caffeine (FIORICET) 50-325-40 MG tablet [id number]  Take 1 tablet by mouth every 4 (four) hours as needed for migraine. [provider]  Active   carvedilol (COREG) 12.5 MG tablet [id number]  Take 1 tablet (12.5 mg total) by mouth 2 (two) times daily. Clarisa Kindred A, FNP  Active   cetirizine (ZYRTEC) 10 MG tablet [id number] No Take 1 tablet (10 mg total) by mouth every morning. Poggi, Excell Seltzer, MD Taking Active   colchicine 0.6 MG tablet [id number] No Take 1 tablet (0.6 mg total) by mouth daily as needed (gout flare). Poggi, Excell Seltzer, MD Taking Active   Continuous Glucose Sensor (FREESTYLE LIBRE 3 SENSOR) [place] [id number] No PLACE 1 SENSOR ON TO THE SKIN EVERY 14 DAYS. USE TO CHECK BLOOD SUGAR CONTINUEOUSLY. [provider] Taking Active Self  Continuous Glucose Sensor (FREESTYLE LIBRE 3 SENSOR) MISC [id number] No 1 Application by Does not apply route every 14 (fourteen) days. Use to check blood sugar continuously. Jacky Kindle, FNP Taking Active Self  Continuous Glucose Sensor (FREESTYLE LIBRE 3 SENSOR) [place] [id number] No Apply sensor every 14 (fourteen) days to check blood sugar continuously. Sallee Provencal, FNP Taking Active   dapagliflozin propanediol (FARXIGA) 10 MG TABS tablet [id number] No Take 1 tablet (10 mg total) by mouth daily. Delma Freeze, FNP  Taking Active Self  fenofibrate (TRICOR) 145 MG tablet [id number] No Take 1 tablet (145 mg total) by mouth every evening. Poggi, Excell Seltzer, MD Taking Active   FLUoxetine (PROZAC) 20 MG capsule [id number] No Take 1 capsule (20 mg total) by mouth every evening. Poggi, Excell Seltzer, MD Taking Active   fluticasone [facility]) 50 MCG/ACT nasal spray [id number] No Place 2 sprays into both nostrils daily as needed for allergies or rhinitis. Home med. Christena Flake, MD Taking Active   Galcanezumab-gnlm 120 MG/ML Ivory Broad [id number]  Inject 120 mg into the skin. Monthly [provider]  Active   Insulin Glargine (BASAGLAR KWIKPEN) 100 UNIT/ML [id number] No Inject 36 Units into the skin at bedtime. Jacky Kindle, FNP Taking Active Self  insulin lispro (HUMALOG) 100 UNIT/ML injection [id number] No Inject 10 Units into the skin 3 (three) times daily before meals. [provider] Taking Active Self  Insulin Pen Needle 32G X 4 MM MISC [id number] No 1 each by Does not apply route in the morning, at noon, in the evening, and at bedtime. Jacky Kindle, FNP Taking Active Self  ipratropium-albuterol (DUONEB) 0.5-2.5 (3) MG/3ML SOLN [id number] No Take 3 mLs by nebulization every 6 (six) hours as needed. Sallee Provencal, FNP Taking Active Self  isosorbide mononitrate (IMDUR) 30 MG 24 hr tablet [id number] No Take 0.5 tablets (15 mg total) by mouth every morning. Poggi, Excell Seltzer, MD Taking Active   meclizine (ANTIVERT) 25 MG tablet [id number] No Take 0.5 tablets (12.5 mg total) by mouth 3 (three) times daily as needed for dizziness. Poggi, Excell Seltzer, MD Taking Active   metoCLOPramide (REGLAN) 5 MG tablet [id number] No Take 1 tablet (5 mg total) by mouth every 8 (eight) hours as needed for nausea. Toney Reil, MD Taking Active Self  naltrexone (DEPADE) 50 MG tablet [id number] No Take 1 tablet (50 mg total) by mouth every morning. Poggi, Excell Seltzer, MD Taking Active   nitroGLYCERIN (NITROSTAT) 0.4 MG SL tablet [id number] No Place 1 tablet (0.4 mg total) under the tongue every 5 (five) minutes x 3 doses as needed for chest pain. Jacky Kindle, FNP Taking Active Self  omeprazole (PRILOSEC) 40 MG capsule [id number] No Take 1 capsule (40 mg total) by mouth every morning. Poggi, Excell Seltzer, MD Taking  Active   oseltamivir (TAMIFLU) 75 MG capsule [id number]  Take 1 capsule (75 mg total) by mouth 2 (two) times daily for 4 days. Charise Killian, MD  Active   oxyCODONE (ROXICODONE) 5 MG immediate release tablet [id number] No Take 1-2 tablets (5-10 mg total) by mouth every 4 (four) hours as needed for moderate pain (pain score 4-6) or severe pain (pain score 7-10). Poggi, Excell Seltzer, MD Taking Active   potassium chloride SA (KLOR-CON M) 20 MEQ tablet [id number] No Take 1 tablet (20 mEq total) by mouth every other day. AM Poggi, Excell Seltzer, MD Taking Active   predniSONE (DELTASONE) 20 MG tablet [id number]  Take 2 tablets (40 mg total) by mouth daily with breakfast for 4 days. Charise Killian, MD  Active   REZDIFFRA 100 MG TABS [id number]  TAKE 1 TABLET BY MOUTH DAILY WITH OR WITHOUT FOOD Vanga, [name], MD  Active   rivaroxaban (XARELTO) 20 MG TABS tablet [id number] No Take 1 tablet (20 mg total) by mouth daily with supper. Bensimhon, Bevelyn Buckles, MD Taking Active Self  rosuvastatin (CRESTOR) 20 MG tablet [id number] No Take 1 tablet (20 mg total) by mouth at bedtime. Poggi, Jonny Ruiz  J, MD Taking Active   sacubitril-valsartan (ENTRESTO) 97-103 MG [id number] No Take 1 tablet by mouth 2 (two) times daily. Poggi, Excell Seltzer, MD Taking Active   Semaglutide, 2 MG/DOSE, (OZEMPIC, 2 MG/DOSE,) 8 MG/3ML SOPN [id number] No Inject 2 mg into the skin once a week. Tuesdays Christena Flake, MD Taking Active   spironolactone (ALDACTONE) 25 MG tablet [id number] No Take 1 tablet by mouth once daily Delma Freeze, FNP Taking Active   TALTZ 80 MG/ML SOAJ [id number] No Inject 3 mLs into the skin every 28 (twenty-eight) days. [provider] Taking Active Self           Med Note Lenoria Farrier   Fri Aug 08, 2023  3:20 PM)    torsemide (DEMADEX) 20 MG tablet [id number] No Take 1 tablet (20 mg total) by mouth every morning. Poggi, Excell Seltzer, MD Taking Active   umeclidinium-vilanterol [facility] ELLIPTA) 62.5-25 MCG/ACT AEPB [id number] No  Inhale 1 puff into the lungs every morning. Poggi, Excell Seltzer, MD Taking Active             Home Care and Equipment/Supplies: Were Home Health Services Ordered?: NA Any new equipment or medical supplies ordered?: NA  Functional Questionnaire: Do you need assistance with bathing/showering or dressing?: No Do you need assistance with meal preparation?: No Do you need assistance with eating?: No Do you have difficulty maintaining continence: No Do you need assistance with getting out of bed/getting out of a chair/moving?: No Do you have difficulty managing or taking your medications?: No  Follow up appointments reviewed: PCP Follow-up appointment confirmed?: Yes Date of PCP follow-up appointment?: 10/31/23 Follow-up Provider: Charlcie Cradle Specialist [facility] Follow-up appointment confirmed?: NA Do you need transportation to your follow-up appointment?: No Do you understand care options if your condition(s) worsen?: Yes-patient verbalized understanding  SDOH Interventions Today    Flowsheet Row Most Recent Value  SDOH Interventions   Food Insecurity Interventions Intervention Not Indicated  Housing Interventions Intervention Not Indicated  Transportation Interventions Intervention Not Indicated  Utilities Interventions Intervention Not Indicated      Interventions Today    Flowsheet Row Most Recent Value  Chronic Disease   Chronic disease during today's visit Chronic Obstructive Pulmonary Disease (COPD)  General Interventions   General Interventions Discussed/Reviewed General Interventions Discussed, Doctor Visits  Doctor Visits Discussed/Reviewed Doctor Visits Reviewed  Education Interventions   Education Provided Provided Education  Provided Verbal Education On Blood Sugar Monitoring, Medication, Insurance Plans  Pharmacy Interventions   Pharmacy Dicussed/Reviewed Medications and their functions       The patient is still sick with the Flu. He did not feel good  during the interview process and it was a brief conversation. PCP appointment was scheduled. Reviewed steroids and the effect on his blood sugar and the fact that he is not eating nor drinking much.  Deidre Ala, BSN, RN [place]  [facility] - [employer] [facility] RN Care Manager [phone]

## 2023-10-28 ENCOUNTER — Encounter: Payer: Medicaid Other | Admitting: Internal Medicine

## 2023-10-30 ENCOUNTER — Telehealth: Payer: Self-pay

## 2023-10-31 ENCOUNTER — Inpatient Hospital Stay: Admitting: Family Medicine

## 2023-10-31 ENCOUNTER — Ambulatory Visit: Admitting: Family Medicine

## 2023-10-31 ENCOUNTER — Telehealth: Payer: Self-pay

## 2023-10-31 NOTE — Telephone Encounter (Signed)
 Copied from CRM [id number]. Topic: Clinical - Medication Question >> Oct 31, 2023 12:56 PM Gery Pray wrote: Reason for CRM: Patient called to have Dr. Ephriam Knuckles to call his cardiologist due to medication that was provided significantly lowering his blood pressure 80/45. Patient is experiencing Vertigo with the hypotension. Please contact patient at [phone].

## 2023-10-31 NOTE — Telephone Encounter (Signed)
 Messaged most recent cardiology provider, Bing Neighbors, FNP regarding pt's symptoms. And ED recommendation.

## 2023-10-31 NOTE — Telephone Encounter (Signed)
 Spoke to the [facility] on behalf of this patient. He wanted a telephone call visit today instead of coming into the office and patient does not know how to work [employer]. Due to patient feeling dizzy and unable to drive to appt the provider said she would send a message to the cardiologist on his behalf but he also needed to call and let them know that he would reschedule with [place] for another day. And provider also advise that if he felt that he needs to go into the ED .

## 2023-11-03 ENCOUNTER — Inpatient Hospital Stay
Admission: EM | Admit: 2023-11-03 | Discharge: 2023-11-07 | DRG: 308 | Disposition: A | Discharge status: Home / Self Care | Attending: Internal Medicine | Admitting: Internal Medicine

## 2023-11-03 ENCOUNTER — Other Ambulatory Visit: Payer: Self-pay

## 2023-11-03 ENCOUNTER — Emergency Department

## 2023-11-03 ENCOUNTER — Ambulatory Visit (HOSPITAL_BASED_OUTPATIENT_CLINIC_OR_DEPARTMENT_OTHER): Admitting: Family

## 2023-11-03 ENCOUNTER — Encounter: Payer: Self-pay | Admitting: Family

## 2023-11-03 ENCOUNTER — Encounter: Payer: Self-pay | Admitting: Internal Medicine

## 2023-11-03 ENCOUNTER — Telehealth (HOSPITAL_COMMUNITY): Payer: Self-pay

## 2023-11-03 VITALS — BP 105/70 | HR 62 | Wt 230.0 lb

## 2023-11-03 DIAGNOSIS — I4729 Other ventricular tachycardia: Secondary | ICD-10-CM | POA: Insufficient documentation

## 2023-11-03 DIAGNOSIS — Z7985 Long-term (current) use of injectable non-insulin antidiabetic drugs: Secondary | ICD-10-CM

## 2023-11-03 DIAGNOSIS — Z7901 Long term (current) use of anticoagulants: Secondary | ICD-10-CM | POA: Insufficient documentation

## 2023-11-03 DIAGNOSIS — N182 Chronic kidney disease, stage 2 (mild): Secondary | ICD-10-CM | POA: Diagnosis present

## 2023-11-03 DIAGNOSIS — Z794 Long term (current) use of insulin: Secondary | ICD-10-CM

## 2023-11-03 DIAGNOSIS — I272 Pulmonary hypertension, unspecified: Secondary | ICD-10-CM | POA: Insufficient documentation

## 2023-11-03 DIAGNOSIS — I252 Old myocardial infarction: Secondary | ICD-10-CM

## 2023-11-03 DIAGNOSIS — E1169 Type 2 diabetes mellitus with other specified complication: Secondary | ICD-10-CM | POA: Diagnosis present

## 2023-11-03 DIAGNOSIS — R079 Chest pain, unspecified: Secondary | ICD-10-CM

## 2023-11-03 DIAGNOSIS — R918 Other nonspecific abnormal finding of lung field: Secondary | ICD-10-CM | POA: Diagnosis not present

## 2023-11-03 DIAGNOSIS — Z95810 Presence of automatic (implantable) cardiac defibrillator: Secondary | ICD-10-CM

## 2023-11-03 DIAGNOSIS — Z89022 Acquired absence of left finger(s): Secondary | ICD-10-CM

## 2023-11-03 DIAGNOSIS — I5023 Acute on chronic systolic (congestive) heart failure: Secondary | ICD-10-CM | POA: Diagnosis not present

## 2023-11-03 DIAGNOSIS — I5022 Chronic systolic (congestive) heart failure: Secondary | ICD-10-CM | POA: Insufficient documentation

## 2023-11-03 DIAGNOSIS — I4891 Unspecified atrial fibrillation: Secondary | ICD-10-CM | POA: Diagnosis not present

## 2023-11-03 DIAGNOSIS — G4733 Obstructive sleep apnea (adult) (pediatric): Secondary | ICD-10-CM | POA: Insufficient documentation

## 2023-11-03 DIAGNOSIS — I428 Other cardiomyopathies: Secondary | ICD-10-CM

## 2023-11-03 DIAGNOSIS — I2510 Atherosclerotic heart disease of native coronary artery without angina pectoris: Secondary | ICD-10-CM | POA: Diagnosis present

## 2023-11-03 DIAGNOSIS — I4819 Other persistent atrial fibrillation: Secondary | ICD-10-CM | POA: Diagnosis not present

## 2023-11-03 DIAGNOSIS — R0602 Shortness of breath: Secondary | ICD-10-CM | POA: Diagnosis not present

## 2023-11-03 DIAGNOSIS — Z86711 Personal history of pulmonary embolism: Secondary | ICD-10-CM | POA: Insufficient documentation

## 2023-11-03 DIAGNOSIS — Z79899 Other long term (current) drug therapy: Secondary | ICD-10-CM

## 2023-11-03 DIAGNOSIS — G8929 Other chronic pain: Secondary | ICD-10-CM | POA: Diagnosis present

## 2023-11-03 DIAGNOSIS — E1122 Type 2 diabetes mellitus with diabetic chronic kidney disease: Secondary | ICD-10-CM | POA: Diagnosis present

## 2023-11-03 DIAGNOSIS — M109 Gout, unspecified: Secondary | ICD-10-CM | POA: Diagnosis present

## 2023-11-03 DIAGNOSIS — Z6832 Body mass index (BMI) 32.0-32.9, adult: Secondary | ICD-10-CM

## 2023-11-03 DIAGNOSIS — Z87891 Personal history of nicotine dependence: Secondary | ICD-10-CM

## 2023-11-03 DIAGNOSIS — M25512 Pain in left shoulder: Secondary | ICD-10-CM | POA: Diagnosis not present

## 2023-11-03 DIAGNOSIS — I10 Essential (primary) hypertension: Secondary | ICD-10-CM

## 2023-11-03 DIAGNOSIS — Z8249 Family history of ischemic heart disease and other diseases of the circulatory system: Secondary | ICD-10-CM

## 2023-11-03 DIAGNOSIS — I255 Ischemic cardiomyopathy: Secondary | ICD-10-CM | POA: Diagnosis present

## 2023-11-03 DIAGNOSIS — E785 Hyperlipidemia, unspecified: Secondary | ICD-10-CM | POA: Diagnosis not present

## 2023-11-03 DIAGNOSIS — G4730 Sleep apnea, unspecified: Secondary | ICD-10-CM | POA: Diagnosis not present

## 2023-11-03 DIAGNOSIS — Z7984 Long term (current) use of oral hypoglycemic drugs: Secondary | ICD-10-CM

## 2023-11-03 DIAGNOSIS — E669 Obesity, unspecified: Secondary | ICD-10-CM | POA: Diagnosis present

## 2023-11-03 DIAGNOSIS — E1129 Type 2 diabetes mellitus with other diabetic kidney complication: Secondary | ICD-10-CM | POA: Diagnosis present

## 2023-11-03 DIAGNOSIS — N1832 Chronic kidney disease, stage 3b: Secondary | ICD-10-CM | POA: Diagnosis not present

## 2023-11-03 DIAGNOSIS — I2699 Other pulmonary embolism without acute cor pulmonale: Secondary | ICD-10-CM

## 2023-11-03 DIAGNOSIS — I3139 Other pericardial effusion (noninflammatory): Secondary | ICD-10-CM | POA: Diagnosis present

## 2023-11-03 DIAGNOSIS — F339 Major depressive disorder, recurrent, unspecified: Secondary | ICD-10-CM | POA: Diagnosis not present

## 2023-11-03 DIAGNOSIS — Z7982 Long term (current) use of aspirin: Secondary | ICD-10-CM

## 2023-11-03 DIAGNOSIS — Z8673 Personal history of transient ischemic attack (TIA), and cerebral infarction without residual deficits: Secondary | ICD-10-CM

## 2023-11-03 DIAGNOSIS — R0789 Other chest pain: Secondary | ICD-10-CM | POA: Diagnosis not present

## 2023-11-03 DIAGNOSIS — I472 Ventricular tachycardia, unspecified: Secondary | ICD-10-CM | POA: Diagnosis not present

## 2023-11-03 DIAGNOSIS — N1831 Chronic kidney disease, stage 3a: Secondary | ICD-10-CM | POA: Insufficient documentation

## 2023-11-03 DIAGNOSIS — I130 Hypertensive heart and chronic kidney disease with heart failure and stage 1 through stage 4 chronic kidney disease, or unspecified chronic kidney disease: Secondary | ICD-10-CM | POA: Insufficient documentation

## 2023-11-03 DIAGNOSIS — K7581 Nonalcoholic steatohepatitis (NASH): Secondary | ICD-10-CM | POA: Diagnosis not present

## 2023-11-03 DIAGNOSIS — K7460 Unspecified cirrhosis of liver: Secondary | ICD-10-CM | POA: Insufficient documentation

## 2023-11-03 DIAGNOSIS — Z91148 Patient's other noncompliance with medication regimen for other reason: Secondary | ICD-10-CM

## 2023-11-03 DIAGNOSIS — Z888 Allergy status to other drugs, medicaments and biological substances status: Secondary | ICD-10-CM

## 2023-11-03 DIAGNOSIS — Z1152 Encounter for screening for COVID-19: Secondary | ICD-10-CM

## 2023-11-03 DIAGNOSIS — Z8616 Personal history of COVID-19: Secondary | ICD-10-CM

## 2023-11-03 DIAGNOSIS — J449 Chronic obstructive pulmonary disease, unspecified: Secondary | ICD-10-CM | POA: Diagnosis not present

## 2023-11-03 DIAGNOSIS — I959 Hypotension, unspecified: Secondary | ICD-10-CM | POA: Diagnosis not present

## 2023-11-03 DIAGNOSIS — Z841 Family history of disorders of kidney and ureter: Secondary | ICD-10-CM

## 2023-11-03 DIAGNOSIS — L409 Psoriasis, unspecified: Secondary | ICD-10-CM | POA: Insufficient documentation

## 2023-11-03 DIAGNOSIS — Z833 Family history of diabetes mellitus: Secondary | ICD-10-CM

## 2023-11-03 LAB — BASIC METABOLIC PANEL
Anion gap: 12 (ref 5–15)
BUN: 30 mg/dL — ABNORMAL HIGH (ref 6–20)
CO2: 19 mmol/L — ABNORMAL LOW (ref 22–32)
Calcium: 8.7 mg/dL — ABNORMAL LOW (ref 8.9–10.3)
Chloride: 102 mmol/L (ref 98–111)
Creatinine, Ser: 1.2 mg/dL (ref 0.61–1.24)
GFR, Estimated: >60 mL/min (ref >60)
Glucose, Bld: 128 mg/dL — ABNORMAL HIGH (ref 70–99)
Potassium: 4 mmol/L (ref 3.5–5.1)
Sodium: 133 mmol/L — ABNORMAL LOW (ref 135–145)

## 2023-11-03 LAB — TROPONIN I (HIGH SENSITIVITY)
Troponin I (High Sensitivity): 27 ng/L — ABNORMAL HIGH (ref <18)
Troponin I (High Sensitivity): 24 ng/L — ABNORMAL HIGH (ref <18)

## 2023-11-03 LAB — CBC
HCT: 50.1 % (ref 39.0–52.0)
Hemoglobin: 16 g/dL (ref 13.0–17.0)
MCH: 25.7 pg — ABNORMAL LOW (ref 26.0–34.0)
MCHC: 31.9 g/dL (ref 30.0–36.0)
MCV: 80.5 fL (ref 80.0–100.0)
Platelets: 415 10*3/uL — ABNORMAL HIGH (ref 150–400)
RBC: 6.22 MIL/uL — ABNORMAL HIGH (ref 4.22–5.81)
RDW: 15.5 % (ref 11.5–15.5)
WBC: 11 10*3/uL — ABNORMAL HIGH (ref 4.0–10.5)
nRBC: 0 % (ref 0.0–0.2)

## 2023-11-03 LAB — RESP PANEL BY RT-PCR (RSV, FLU A&B, COVID)  RVPGX2
Influenza A by PCR: NEGATIVE
Influenza B by PCR: NEGATIVE
Resp Syncytial Virus by PCR: NEGATIVE
SARS Coronavirus 2 by RT PCR: NEGATIVE

## 2023-11-03 LAB — CBG MONITORING, ED
Glucose-Capillary: 121 mg/dL — ABNORMAL HIGH (ref 70–99)
Glucose-Capillary: 139 mg/dL — ABNORMAL HIGH (ref 70–99)
Glucose-Capillary: 90 mg/dL (ref 70–99)

## 2023-11-03 LAB — PROTIME-INR
INR: 1.9 — ABNORMAL HIGH (ref 0.8–1.2)
Prothrombin Time: 22.1 s — ABNORMAL HIGH (ref 11.4–15.2)

## 2023-11-03 LAB — TSH: TSH: 0.649 u[IU]/mL (ref 0.350–4.500)

## 2023-11-03 LAB — MAGNESIUM: Magnesium: 1.9 mg/dL (ref 1.7–2.4)

## 2023-11-03 LAB — BRAIN NATRIURETIC PEPTIDE: B Natriuretic Peptide: 164.1 pg/mL — ABNORMAL HIGH (ref 0.0–100.0)

## 2023-11-03 LAB — PHOSPHORUS: Phosphorus: 3.9 mg/dL (ref 2.5–4.6)

## 2023-11-03 MED ORDER — INSULIN ASPART 100 UNIT/ML IJ SOLN: 0.0000 [IU] | Freq: Every day | INTRAMUSCULAR | Status: DC

## 2023-11-03 MED ORDER — ONDANSETRON HCL 4 MG PO TABS: 4.0000 mg | ORAL_TABLET | Freq: Four times a day (QID) | ORAL | Status: DC | PRN

## 2023-11-03 MED ORDER — FLUOXETINE HCL 20 MG PO CAPS
20.0000 mg | ORAL_CAPSULE | Freq: Every evening | ORAL | Status: DC
  Administered 2023-11-03 – 2023-11-06 (×4): 20 mg via ORAL
  Filled 2023-11-03 (×4): qty 1

## 2023-11-03 MED ORDER — ONDANSETRON HCL 4 MG/2ML IJ SOLN
INTRAMUSCULAR | Status: AC
  Filled 2023-11-03: qty 2

## 2023-11-03 MED ORDER — ONDANSETRON HCL 4 MG/2ML IJ SOLN
4.0000 mg | Freq: Once | INTRAMUSCULAR | Status: AC
  Administered 2023-11-03: 4 mg via INTRAVENOUS

## 2023-11-03 MED ORDER — ACETAMINOPHEN 650 MG RE SUPP: 650.0000 mg | Freq: Four times a day (QID) | RECTAL | Status: DC | PRN

## 2023-11-03 MED ORDER — ACETAMINOPHEN 500 MG PO TABS
1000.0000 mg | ORAL_TABLET | Freq: Once | ORAL | Status: AC
  Administered 2023-11-03: 1000 mg via ORAL
  Filled 2023-11-03: qty 2

## 2023-11-03 MED ORDER — MELATONIN 5 MG PO TABS
5.0000 mg | ORAL_TABLET | Freq: Every evening | ORAL | Status: DC | PRN
  Administered 2023-11-03 – 2023-11-05 (×3): 5 mg via ORAL
  Filled 2023-11-03 (×3): qty 1

## 2023-11-03 MED ORDER — ACETAMINOPHEN 325 MG PO TABS
650.0000 mg | ORAL_TABLET | Freq: Four times a day (QID) | ORAL | Status: DC | PRN
  Administered 2023-11-06: 650 mg via ORAL
  Filled 2023-11-03: qty 2

## 2023-11-03 MED ORDER — ASPIRIN 81 MG PO TBEC
81.0000 mg | DELAYED_RELEASE_TABLET | ORAL | Status: DC
Start: 2023-11-04 — End: 2023-11-07
  Administered 2023-11-04 – 2023-11-07 (×3): 81 mg via ORAL
  Filled 2023-11-03 (×4): qty 1

## 2023-11-03 MED ORDER — IOHEXOL 350 MG/ML SOLN
75.0000 mL | Freq: Once | INTRAVENOUS | Status: AC | PRN
  Administered 2023-11-03: 75 mL via INTRAVENOUS

## 2023-11-03 MED ORDER — RIVAROXABAN 20 MG PO TABS
20.0000 mg | ORAL_TABLET | Freq: Every day | ORAL | Status: DC
  Administered 2023-11-03 – 2023-11-06 (×4): 20 mg via ORAL
  Filled 2023-11-03 (×5): qty 1

## 2023-11-03 MED ORDER — ONDANSETRON HCL 4 MG/2ML IJ SOLN: 4.0000 mg | Freq: Four times a day (QID) | INTRAMUSCULAR | Status: DC | PRN

## 2023-11-03 MED ORDER — INSULIN GLARGINE 100 UNIT/ML ~~LOC~~ SOLN
36.0000 [IU] | Freq: Every day | SUBCUTANEOUS | Status: DC
  Administered 2023-11-04 – 2023-11-06 (×3): 36 [IU] via SUBCUTANEOUS
  Filled 2023-11-03 (×5): qty 0.36

## 2023-11-03 MED ORDER — FENOFIBRATE 160 MG PO TABS
160.0000 mg | ORAL_TABLET | Freq: Every day | ORAL | Status: DC
  Administered 2023-11-03 – 2023-11-06 (×4): 160 mg via ORAL
  Filled 2023-11-03 (×5): qty 1

## 2023-11-03 MED ORDER — DILTIAZEM HCL 25 MG/5ML IV SOLN
15.0000 mg | Freq: Four times a day (QID) | INTRAVENOUS | Status: AC | PRN
  Administered 2023-11-04 (×2): 15 mg via INTRAVENOUS
  Filled 2023-11-03 (×2): qty 5

## 2023-11-03 MED ORDER — DILTIAZEM HCL 25 MG/5ML IV SOLN
15.0000 mg | Freq: Once | INTRAVENOUS | Status: AC
  Administered 2023-11-03: 15 mg via INTRAVENOUS
  Filled 2023-11-03: qty 5

## 2023-11-03 MED ORDER — BUTALBITAL-APAP-CAFFEINE 50-325-40 MG PO TABS: 1.0000 | ORAL_TABLET | ORAL | Status: DC | PRN

## 2023-11-03 MED ORDER — SODIUM CHLORIDE 0.9 % IV SOLN: INTRAVENOUS | Status: DC

## 2023-11-03 MED ORDER — IPRATROPIUM-ALBUTEROL 0.5-2.5 (3) MG/3ML IN SOLN: 3.0000 mL | Freq: Four times a day (QID) | RESPIRATORY_TRACT | Status: AC | PRN

## 2023-11-03 MED ORDER — DEXTROSE 50 % IV SOLN: 12.5000 g | INTRAVENOUS | Status: DC | PRN

## 2023-11-03 MED ORDER — LACTATED RINGERS IV BOLUS: 500.0000 mL | Freq: Once | INTRAVENOUS | Status: DC

## 2023-11-03 MED ORDER — AZELASTINE HCL 0.1 % NA SOLN
2.0000 | Freq: Two times a day (BID) | NASAL | Status: DC | PRN
  Administered 2023-11-05: 2 via NASAL
  Filled 2023-11-03: qty 30

## 2023-11-03 MED ORDER — SENNOSIDES-DOCUSATE SODIUM 8.6-50 MG PO TABS: 1.0000 | ORAL_TABLET | Freq: Every evening | ORAL | Status: DC | PRN

## 2023-11-03 MED ORDER — RESMETIROM 100 MG PO TABS: 100.0000 mg | ORAL_TABLET | Freq: Every day | ORAL | Status: DC

## 2023-11-03 MED ORDER — DILTIAZEM HCL 25 MG/5ML IV SOLN: 15.0000 mg | Freq: Four times a day (QID) | INTRAVENOUS | Status: DC | PRN

## 2023-11-03 MED ORDER — DILTIAZEM HCL 25 MG/5ML IV SOLN: 20.0000 mg | Freq: Once | INTRAVENOUS | Status: DC

## 2023-11-03 MED ORDER — PANTOPRAZOLE SODIUM 40 MG PO TBEC
80.0000 mg | DELAYED_RELEASE_TABLET | Freq: Every morning | ORAL | Status: DC
  Administered 2023-11-04 – 2023-11-07 (×4): 80 mg via ORAL
  Filled 2023-11-03 (×4): qty 2

## 2023-11-03 MED ORDER — SODIUM CHLORIDE 0.9 % IV BOLUS
500.0000 mL | Freq: Once | INTRAVENOUS | Status: AC
  Administered 2023-11-03: 500 mL via INTRAVENOUS

## 2023-11-03 MED ORDER — ROSUVASTATIN CALCIUM 10 MG PO TABS
20.0000 mg | ORAL_TABLET | Freq: Every day | ORAL | Status: DC
  Administered 2023-11-03 – 2023-11-06 (×4): 20 mg via ORAL
  Filled 2023-11-03 (×2): qty 2
  Filled 2023-11-03 (×2): qty 1
  Filled 2023-11-03: qty 2

## 2023-11-03 MED ORDER — UMECLIDINIUM-VILANTEROL 62.5-25 MCG/ACT IN AEPB
1.0000 | INHALATION_SPRAY | RESPIRATORY_TRACT | Status: DC
  Administered 2023-11-06 – 2023-11-07 (×2): 1 via RESPIRATORY_TRACT
  Filled 2023-11-03 (×2): qty 14

## 2023-11-03 MED ORDER — ALLOPURINOL 100 MG PO TABS
300.0000 mg | ORAL_TABLET | ORAL | Status: DC
  Administered 2023-11-04 – 2023-11-07 (×3): 300 mg via ORAL
  Filled 2023-11-03 (×2): qty 3
  Filled 2023-11-03: qty 1
  Filled 2023-11-03: qty 3

## 2023-11-03 MED ORDER — INSULIN ASPART 100 UNIT/ML IJ SOLN
0.0000 [IU] | Freq: Three times a day (TID) | INTRAMUSCULAR | Status: DC
  Administered 2023-11-03 – 2023-11-04 (×2): 2 [IU] via SUBCUTANEOUS
  Administered 2023-11-04: 3 [IU] via SUBCUTANEOUS
  Administered 2023-11-06 (×2): 2 [IU] via SUBCUTANEOUS
  Filled 2023-11-03 (×5): qty 1

## 2023-11-03 MED ORDER — BUPROPION HCL ER (XL) 150 MG PO TB24
150.0000 mg | ORAL_TABLET | Freq: Every day | ORAL | Status: DC
  Administered 2023-11-03 – 2023-11-06 (×4): 150 mg via ORAL
  Filled 2023-11-03 (×6): qty 1

## 2023-11-03 MED ORDER — NITROGLYCERIN 0.4 MG SL SUBL: 0.4000 mg | SUBLINGUAL_TABLET | SUBLINGUAL | Status: DC | PRN

## 2023-11-03 MED ORDER — MECLIZINE HCL 25 MG PO TABS
12.5000 mg | ORAL_TABLET | Freq: Three times a day (TID) | ORAL | Status: DC | PRN
Start: 2023-11-03 — End: 2023-11-04

## 2023-11-03 MED ORDER — CARVEDILOL 6.25 MG PO TABS: 12.5000 mg | ORAL_TABLET | Freq: Two times a day (BID) | ORAL | Status: DC

## 2023-11-03 NOTE — Assessment & Plan Note (Signed)
 Home fluoxetine 20 mg every evening resumed Bupropion 150 mg daily resumed

## 2023-11-03 NOTE — Assessment & Plan Note (Addendum)
 Etiology workup in progress Check TSH and complete echo on admission Currently no symptoms reported by patient concerning for infectious etiology Chest x-ray ordered by EDP negative for concerns of pneumonia Cardiology has been consulted for atrial fibrillation and heart failure medication optimization Diltiazem 15 mg every 6 hours as needed for heart rate greater than 120, 2 doses ordered on admission Admit to telemetry cardiac, inpatient

## 2023-11-03 NOTE — Telephone Encounter (Signed)
-----   Message from Delma Freeze sent at 10/31/2023  3:38 PM EDT ----- Regarding: FW: Low BP, symptomatic Please call patient and ask him to cut his entresto in half and take 1/2 tablet BID. Needs an appt next week. Agree with ED if BP is still that low and he's dizzy. ----- Message ----- From: Sallee Provencal, FNP Sent: 10/31/2023   3:28 PM EDT To: Delma Freeze, FNP Subject: Low BP, symptomatic                            Good Afternoon Inetta Fermo,   My name is Charlcie Cradle. I am Mr. Weismann's PCP. I was assigned to him after his previous PCP left.  He was supposed to come into [facility] today for COPD hospitalization follow up. Unfortunately he had to cancel due to dizziness and hypotensive (80/45) per his phone phone with [facility].   He was not able to operate virtual, but when he spoke to my front desk he asked for me to message his cardiology team in regards to his symptoms and stating a new medication change by cardiology. Per chart review - It appears you saw him in February - I am not sure which new medication he is speaking of?  I recommended he reach out personally to cardiology as well, but I also told him given his hypotensive symptoms to seek care through ED.    Thank you,  Caryl Asp

## 2023-11-03 NOTE — Assessment & Plan Note (Signed)
 CPAP nightly ordered on admission

## 2023-11-03 NOTE — Progress Notes (Signed)
 [facility] Note   PCP: Merita Norton, FNP (last seen 08/24) Primary Cardiologist: Julien Nordmann, MD (last seen 07/24; returns 05/25) HF cardiologist: Arvilla Meres, MD (last seen 09/24; returns 03/25)  Chief Complaint: shortness of breath  HPI:  Gabriel [name] is a 53 y/o male with a history of HTN, T2DM, chronic chest pain, chronic troponin elevation, COPD, CKD, asthma, NSVT, OSA, unprovoked PE, pulmonary HTN, psoriasis, migraines, cirrhosis from NASH, ICD placement (10/23), previous tobacco use and chronic heart failure. Had left shoulder arthroscopy with debridement, decompression, rotator cuff repair and biceps tenodesis done 09/16/23.   RHC/LHC done 06/13/20 and showed: Severe single-vessel coronary artery disease with chronic total occlusion of the proximal RCA.  The distal vessel fills via left to right collaterals. Mild to moderate, nonobstructive CAD involving the LAD and LCx with up to 50% stenosis. Mildly elevated left and right heart filling pressures. Moderate pulmonary hypertension (mean PAP 40 mmHg) with significantly elevated pulmonary vascular resistance (PVR 7.4 WU). Severely reduced Fick cardiac output/index.  Echo 05/21/21: EF of 40-45% along with mild LVH and mild Gabriel.   Admitted 09/2021 & 11/2021 with HF.   Echo 11/24/21: EF of 20-25% along with mild LVH and mild Gabriel.  Echo 02/06/22: EF of 30-35% along with mild/moderate AR.   Admitted 05/20/22 for ICD placement and discharged the next day. Was in the ED 12/21/22 due to dizziness & chest pain.   Echo 08/14/22: EF 40-45% with mild LVH, Grade I DD, severe hypokinesis of left ventricular, entire inferior and inferolateral wall.  Echo 03/08/23: EF 45-50% with moderate LVH, trivial Gabriel, mild dilatation of aortic root at 43 mm  Was in the ED 06/09/23 due to SOB/ chest pain for previous 2 days. CXR concerning for pneumonia so antibiotics provided. Initial troponin in the 50's which is close to his baseline. EKG  without ischemic changes.   RHC/ LHC 08/19/23:  Relatively stable appearance of the coronary arteries since last catheterization in 2021.  Chronically occluded proximal RCA now has antegrade flow, though I suspect this represents bridging collaterals.  Left-to-right collateral remain as well as moderate LAD and LCx disease. Mildly elevated left heart filling pressures (LVEDP 20 mmHg, PCWP 18 mmHg). Moderate pulmonary hypertension (mean PA 41 mmHg, PVR 5.3 WU). Moderately reduced Fick cardiac output/index (CO 4.3 L/min, CI 1.9 L/min/m^2).  Admitted 10/22/23 due to COPD exacerbation secondary to influenza.    He presents today for an acute HF visit with a chief complaint of dizziness with position changes. Says this has been present since carvedilol was increased ~ 1 month ago. Has shortness of breath, fatigue, palpitations (increasing), decreased appetite, chronic chest pain and weight loss along with this. Denies abdominal distention or pedal edema. He says that he feels dizzy especially after he takes his morning medications. His chest pain isn't worse but the palpitations are more frequent.   Took his last ozempic yesterday.   ROS: All systems negative except as listed in HPI, PMH and Problem List.  SH:  Social History   Socioeconomic History   Marital status: Widowed    Spouse name: Not on file   Number of children: 1   Years of education: 57   Highest education level: 12th grade  Occupational History   Occupation: disabled  Tobacco Use   Smoking status: Former    Current packs/day: 0.00    Average packs/day: 0.5 packs/day for 33.0 years (16.5 ttl pk-yrs)    Types: Cigarettes    Start date: 05/09/1987  Quit date: 05/08/2020    Years since quitting: 3.4   Smokeless tobacco: Former    Quit date: 05/08/2020   Tobacco comments:    Quit Sept 2021  Vaping Use   Vaping status: Former  Substance and Sexual Activity   Alcohol use: Not Currently   Drug use: No   Sexual activity: Not  Currently  Other Topics Concern   Not on file  Social History Narrative   Lives at home with wife and his dad's wife.        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    Social Drivers of Health   Financial Resource Strain: Medium Risk (08/05/2022)   Overall Financial Resource Strain (CARDIA)    Difficulty of Paying Living Expenses: Somewhat hard  Food Insecurity: No Food Insecurity (10/27/2023)   Hunger Vital Sign    Worried About Running Out of Food in the Last Year: Never true    Ran Out of Food in the Last Year: Never true  Transportation Needs: No Transportation Needs (10/27/2023)   PRAPARE - Administrator, Civil Service (Medical): No    Lack of Transportation (Non-Medical): No  Physical Activity: Insufficiently Active (01/20/2018)   Exercise Vital Sign    Days of Exercise per Week: 7 days    Minutes of Exercise per Session: 10 min  Stress: Stress Concern Present (09/02/2017)   [name] of Occupational Health - Occupational Stress Questionnaire    Feeling of Stress : Very much  Social Connections: Unknown (10/22/2023)   Social Connection and Isolation Panel [NHANES]    Frequency of Communication with Friends and Family: Not on file    Frequency of Social Gatherings with Friends and Family: Patient declined    Attends Religious Services: Patient declined    Active Member of Clubs or Organizations: Patient declined    Attends Banker Meetings: Patient declined    Marital Status: Patient declined  Intimate Partner Violence: Not At Risk (10/27/2023)   Humiliation, Afraid, Rape, and Kick questionnaire    Fear of Current or Ex-Partner: No    Emotionally Abused: No    Physically Abused: No    Sexually Abused: No    FH:  Family History  Problem Relation Age of Onset   Diabetes Mother    Diabetes Mellitus II Mother    Hypothyroidism Mother    Hypertension Mother    Kidney failure  Mother        Dialysis   Heart attack Mother        48 yo approximately   Hypertension Father    Gout Father    Cancer Maternal Grandfather    Diabetes Maternal Grandfather    Cancer Paternal Aunt     Past Medical History:  Diagnosis Date   Aortic root dilation (HCC) 11/28/2016   a.) TTE 11/28/2016: 43 mm; b.) TTE 11/23/2019: 38 mm; c.) TTE 08/14/2022: 40 mm; d.) TTE 03/08/2023: 43 mm   Arteriosclerosis of right carotid artery    a.) carotid doppler 04/29/2017: <50% RICA   Bronchitis 07/2023   CAD (coronary artery disease)    a.) MV 04/2015: low risk; b.) LHC 01/06/2017: minor irregs in LAD/Diag/LCX/OM, RCA 40p/m/d; c.) R/LHC 06/13/2020: 50% dLAD, 30% p-mLCx, 40% OM1, 100% pRCA (L-R collats) - med mgmt; d.) [facility] 08/19/2023: 50% dLAD, 30% p-mLCx, 40% OM1, 100% pRCA (L-R collats), 50% p-mRCA - med mgmt   Chest wall pain, chronic  Chronic 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    Chronic Troponin Elevation    CKD (chronic kidney disease), stage II-III    COPD (chronic obstructive pulmonary disease) (HCC)    Depression    DM (diabetes mellitus), type 2 (HCC)    HFrEF (heart failure with reduced ejection fraction) (HCC)    a.) EF 03/20/2015: EF 45-50%; b.) MV 05/19/2015: EF 30-44%; c.) TTE 01/04/2016: EF 20-25%; d.) TTE 02/29/2016: EF 30-35%; e.) TTE 11/28/2016: EF 40-45%; f.) LHC 01/06/2017: EF 25-35%; g.) TTE 04/29/2017: EF 45-50%; h.) TTE 06/26/2019: EF 30-35%; i.) TTE 11/23/2019: EF 25-30%; j.) TTE 06/08/2020: EF 35-40%; k.) TTE 05/21/2021: EF 40-45%; l.) TTE 08/14/2022: EF 40-45%; m.) TTE 03/08/2023: EF 40-45%   History of 2019 novel coronavirus disease (COVID-19) 03/01/2022   History of methicillin resistant staphylococcus aureus (MRSA)    Hyperlipidemia    Hypertension    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 12/2015 noted on tele-->amio;  b. 12/2015 Event monitor: no VT noted.   Obesity (BMI 30.0-34.9)    On rivaroxaban therapy    OSA treated with BiPAP    Polyp of descending colon    Psoriasis    Psoriatic arthritis (HCC)    a.) Tx'd with ixekizumab   Pulmonary HTN (HCC) 02/29/2016   a.) TTE 02/29/2016: RVSP 47; b.) TTE 06/26/2019: RVSP 58.4; c.) TTE 12/08/2019: RVSP 40.8; d.) TTE 06/08/2020: RVSP 44.7; e.) R/LHC 06/08/2020: mRA 9, mPA 40, mPWCP 17, AO sat 98, PA sat 61, CO 3.1, CI 1.3, PVR 7.4, LVEDP 20; f.) R/LHC 08/18/2020: mRA 5, mPA 41, mPCWP 18, AO sat 92, PA sat 59, CO 4.3, CI 1.9, PVR 5.3   Recurrent pulmonary emboli (HCC) 06/07/2020   06/07/20: small bilateral PEs.  12/31/19: RUL and RLL PEs.   Stroke [facility])    pt states per mri in 2023-unsure of when stroke happened   Syncope    a. 01/2016 - felt to be vasovagal.   Thrombocytopenia (HCC)     Current Outpatient Medications  Medication Sig Dispense Refill   Accu-Chek Softclix Lancets lancets USE UP TO FOUR TIMES DAILY     albuterol (VENTOLIN HFA) 108 (90 Base) MCG/ACT inhaler Inhale 2 puffs into the lungs every 4 (four) hours as needed for wheezing or shortness of breath. 8 g 6   allopurinol (ZYLOPRIM) 300 MG tablet Take 300 mg by mouth every morning.     aspirin EC 81 MG tablet Take 81 mg by mouth every morning.     azelastine (ASTELIN) 0.1 % nasal spray Place 2 sprays into both nostrils 2 (two)  times daily. Use in each nostril as directed 30 mL 12   BD PEN NEEDLE NANO 2ND GEN 32G X 4 MM MISC USE  AS DIRECTED AT BEDTIME 200 each 0  blood glucose meter kit and supplies KIT Dispense based on patient and insurance preference. Use up to four times daily as directed. 1 each 11   butalbital-acetaminophen-caffeine (FIORICET) 50-325-40 MG tablet Take 1 tablet by mouth every 4 (four) hours as needed for migraine.     carvedilol (COREG) 12.5 MG tablet Take 1 tablet (12.5 mg total) by mouth 2 (two) times daily. 180 tablet 3   cetirizine (ZYRTEC) 10 MG tablet Take 1 tablet (10 mg total) by mouth every morning.     colchicine 0.6 MG tablet Take 1 tablet (0.6 mg total) by mouth daily as needed (gout flare).     Continuous Glucose Sensor (FREESTYLE LIBRE 3 SENSOR) MISC PLACE 1 SENSOR ON TO THE SKIN EVERY 14 DAYS. USE TO CHECK BLOOD SUGAR CONTINUEOUSLY.     Continuous Glucose Sensor (FREESTYLE LIBRE 3 SENSOR) MISC 1 Application by Does not apply route every 14 (fourteen) days. Use to check blood sugar continuously. 6 each 0   Continuous Glucose Sensor (FREESTYLE LIBRE 3 SENSOR) MISC Apply sensor every 14 (fourteen) days to check blood sugar continuously. 6 each 0   dapagliflozin propanediol (FARXIGA) 10 MG TABS tablet Take 1 tablet (10 mg total) by mouth daily. 90 tablet 3   fenofibrate (TRICOR) 145 MG tablet Take 1 tablet (145 mg total) by mouth every evening.     FLUoxetine (PROZAC) 20 MG capsule Take 1 capsule (20 mg total) by mouth every evening.     fluticasone (FLONASE) 50 MCG/ACT nasal spray Place 2 sprays into both nostrils daily as needed for allergies or rhinitis. Home med.     Galcanezumab-gnlm 120 MG/ML SOAJ Inject 120 mg into the skin. Monthly     Insulin Glargine (BASAGLAR KWIKPEN) 100 UNIT/ML Inject 36 Units into the skin at bedtime. 45 mL 3   insulin lispro (HUMALOG) 100 UNIT/ML injection Inject 10 Units into the skin 3 (three) times daily before meals.     Insulin Pen Needle 32G X 4 MM  MISC 1 each by Does not apply route in the morning, at noon, in the evening, and at bedtime. 120 each 11   ipratropium-albuterol (DUONEB) 0.5-2.5 (3) MG/3ML SOLN Take 3 mLs by nebulization every 6 (six) hours as needed. 360 mL 11   isosorbide mononitrate (IMDUR) 30 MG 24 hr tablet Take 0.5 tablets (15 mg total) by mouth every morning.     meclizine (ANTIVERT) 25 MG tablet Take 0.5 tablets (12.5 mg total) by mouth 3 (three) times daily as needed for dizziness.     metoCLOPramide (REGLAN) 5 MG tablet Take 1 tablet (5 mg total) by mouth every 8 (eight) hours as needed for nausea. 30 tablet 0   naltrexone (DEPADE) 50 MG tablet Take 1 tablet (50 mg total) by mouth every morning.     nitroGLYCERIN (NITROSTAT) 0.4 MG SL tablet Place 1 tablet (0.4 mg total) under the tongue every 5 (five) minutes x 3 doses as needed for chest pain. 30 tablet 0   omeprazole (PRILOSEC) 40 MG capsule Take 1 capsule (40 mg total) by mouth every morning.     oxyCODONE (ROXICODONE) 5 MG immediate release tablet Take 1-2 tablets (5-10 mg total) by mouth every 4 (four) hours as needed for moderate pain (pain score 4-6) or severe pain (pain score 7-10). 40 tablet 0   potassium chloride SA (KLOR-CON M) 20 MEQ tablet Take 1 tablet (20 mEq total) by mouth every other day. AM     REZDIFFRA 100 MG TABS TAKE 1 TABLET BY  MOUTH DAILY WITH OR WITHOUT FOOD 30 tablet 0   rivaroxaban (XARELTO) 20 MG TABS tablet Take 1 tablet (20 mg total) by mouth daily with supper. 90 tablet 3   rosuvastatin (CRESTOR) 20 MG tablet Take 1 tablet (20 mg total) by mouth at bedtime.     sacubitril-valsartan (ENTRESTO) 97-103 MG Take 1 tablet by mouth 2 (two) times daily.     Semaglutide, 2 MG/DOSE, (OZEMPIC, 2 MG/DOSE,) 8 MG/3ML SOPN Inject 2 mg into the skin once a week. Tuesdays     spironolactone (ALDACTONE) 25 MG tablet Take 1 tablet by mouth once daily 90 tablet 1   TALTZ 80 MG/ML SOAJ Inject 3 mLs into the skin every 28 (twenty-eight) days.     torsemide  (DEMADEX) 20 MG tablet Take 1 tablet (20 mg total) by mouth every morning.     umeclidinium-vilanterol (ANORO ELLIPTA) 62.5-25 MCG/ACT AEPB Inhale 1 puff into the lungs every morning.     No current facility-administered medications for this visit.   Vitals:   11/03/23 0852  BP: 105/70  Pulse: 62  SpO2: 100%  Weight: 230 lb (104.3 kg)   Wt Readings from Last 3 Encounters:  11/03/23 230 lb (104.3 kg)  10/22/23 245 lb (111.1 kg)  10/07/23 245 lb (111.1 kg)   Lab Results  Component Value Date   CREATININE 1.20 11/03/2023   CREATININE 1.56 (H) 10/24/2023   CREATININE 0.91 10/23/2023    PHYSICAL EXAM:  General: Tired appearing. No resp difficulty HEENT: normal Neck: supple, no JVD Cor: Irregular rhythm, normal rate -> tachycardia. No rubs, gallops or murmurs Lungs: clear Abdomen: soft, nontender, nondistended. Extremities: no cyanosis, clubbing, rash, edema Neuro: alert & oriented X 3. Moves all 4 extremities w/o difficulty. Affect pleasant   ECG: atrial fibrillation, HR 132 (personally reviewed)   ASSESSMENT & PLAN:  1.  Chronic HFrEF/Mixed Ischemic and nonischemic cardiomyopathy:   - Mixed ICM/NICM - NYHA class III - s/p [employer] Sq-ICD  - weighing daily; reminded to call for an overnight weight gain of > 2 pounds or a weekly weight gain of > 5 pounds - weight down 15 pounds from last visit here 1 month ago - Echo 05/21/21: EF of 40-45% along with mild LVH and mild Gabriel.  - Echo 11/24/21: EF of 20-25% along with mild LVH and mild Gabriel.  - Echo 02/06/22: EF of 30-35% along with mild/moderate AR.  - Echo 08/14/22: EF 40-45% with mild LVH, Grade I DD, severe hypokinesis of left ventricular, entire inferior and inferolateral wall.  - Echo 03/08/23: EF 45-50% with moderate LVH, trivial Gabriel, mild dilatation of aortic root at 43 mm - unable to tolerate 12.5mg  carvedilol dose with increased dizziness; will hold today and decrease back to 6.25mg  BID (this may change pending  admission) - continue Farxiga 10mg  daily - continue Entresto 97/103 bid - continue spiro 25 daily - continue torsemide 20mg  daily  - continue potassium QOD - continue to use Furoscix if weight >= 250 - consider Cardiomems to help volume management as needed -> his insurance won't cover - saw EP (Riddle) 02/25 - ICD placed on 05/20/22 - saw ADHF provider (Bensimhon) 09/24 - BNP 10/22/23 reviewed and was 125.8   2.  CAD/Elevated Troponin and chronic CP:  - H/o CAD  -  RHC/ LHC 08/19/23:     1. Relatively stable appearance of the coronary arteries since last catheterization in 2021.  Chronically occluded proximal RCA now has antegrade flow, though I suspect this represents  bridging collaterals.  Left-to-right collateral remain as well as moderate LAD and LCx disease.    2. Mildly elevated left heart filling pressures (LVEDP 20 mmHg, PCWP 18 mmHg).    3. Moderate pulmonary hypertension (mean PA 41 mmHg, PVR 5.3 WU).    4. Moderately reduced Fick cardiac output/index (CO 4.3 L/min, CI 1.9 L/min/m^2). - continue rosuvastatin 20mg  daily - continue isosorbide MN 15mg  daily - saw cardiology Fransico Michael) 01/25   3.  HTN:  - BP 105/70 - saw PCP Ephriam Knuckles) 12/24 - BMP 10/24/23 reviewed and showed sodium 131, potassium 4.4, creatinine 1.56& GFR 52   4.  H/o PE/Chronic anticoagulation:  - continue xarelto 20mg  daily - no recent bleeding   5.  DM2- - saw nephrology (Korrapati) 08/23  - A1c 07/11/23 reviewed and was 6.6%   6. OSA- - Sleep study 3/22: AHI 28  - sleep study 12/23; AHI 45.1 - sleep study 11/24 AHI 70.0 - wearing CPAP nightly unless feeling very congested - saw pulmonology Wynona Neat) 11/24  7: AF RVR- - EKG today is AF RVR with HR 132 - hasn't missed any xarelto - due to low BP and worsening symptoms of dizziness / palpitations, unable to safely use amiodarone outpatient - took to ED via wheelchair with HF RN present - EP advised - last used ozempic yesterday   Return here  pending admission   Delma Freeze, FNP 11/03/23

## 2023-11-03 NOTE — Patient Instructions (Addendum)
 Medication Changes:  Hold Carvedilol for the next two doses, and then resume at 6.25 MG twice daily  Hold Ozempic this week  Special Instructions // Education:  Do the following things EVERYDAY: Weigh yourself in the morning before breakfast. Write it down and keep it in a log. Take your medicines as prescribed Eat low salt foods--Limit salt (sodium) to 2000 mg per day.  Stay as active as you can everyday Limit all fluids for the day to less than 2 liters   Follow-Up in: 1 month with Clarisa Kindred, FNP    If you have any questions or concerns before your next appointment please send [place] a message through [place] or call our office at [phone], If it is after office hours your call will be answered by our answering service and directed appropriately.     At the [facility], you and your health needs are our priority. We have a designated team specialized in the treatment of Heart Failure. This Care Team includes your primary Heart Failure Specialized Cardiologist (physician), Advanced Practice Providers (APPs- Physician Assistants and Nurse Practitioners), and Pharmacist who all work together to provide you with the care you need, when you need it.   You may see any of the following providers on your designated Care Team at your next follow up:  Dr. Arvilla Meres Dr. Marca Ancona Dr. Dorthula Nettles Dr. Theresia Bough Tonye Becket, NP Robbie Lis, [place] [address] [place], [place] Brynda Peon, NP [place] Lee, NP Clarisa Kindred, NP Enos Fling, PharmD

## 2023-11-03 NOTE — H&P (Addendum)
 History and Physical   Gabriel [name]. XBM:[id number] DOB: 19-Nov-1970 DOA: 11/03/2023  PCP: Sallee Provencal, FNP  Outpatient Specialists: Dr. Mariah Milling, [facility] cardiology Patient coming from: home  I have personally briefly reviewed patient's old medical records in [facility] EMR.  Chief Concern: Dizziness, increased heart rate and A-fib  HPI: Gabriel [name], Gabriel [name]. is a 53 year old male with history of CAD, non-insulin-dependent diabetes mellitus, hypertension, history of PE on Eliquis, history of nonsustained V. tach status post AICD, who presents emergency department for chief concerns of dizziness and atrial fibrillation.  Vitals in the ED showed temperature 98.1, respiration rate of 25 improved to 18, heart rate 62, with elevation to 108, blood pressure 108/75, improved to 110/79, SpO2 of 99% on room air.  Serum sodium is 133, potassium 4.0, chloride 102, bicarb 19, BUN of 30, serum creatinine of 1.20, eGFR > 60, nonfasting blood glucose 128, WBC 11, hemoglobin 16, platelets of 415.  High sensitive troponin was 27 and on repeat was 24.  BNP was 164.1.  COVID/influenza A/influenza B/RSV PCR were negative.  ED treatment: Acetaminophen 1000 mg p.o. one-time dose, diltiazem 15 mg IV one-time dose, ondansetron 4 mg IV one-time dose, sodium chloride 500 mL liter bolus. ----------------------------------- At bedside, patient is able to tell me his first and last name, age, location, current calendar year.  He reports he has been feeling dizziness generalized, over the last few weeks.  He also endorses generalized weakness and poor appetite.  He reports chest discomfort across his chest that is reproducible with palpation and dysphagia with lack of appetite for the last 2 to 3 weeks.  He reports he was recently started on Ozempic and has lost about 20 pounds in the last month.  He reports his last dose of Ozempic was yesterday.  He denies known trauma to his person.  He denies chest  pressure, shortness of breath, nausea, vomiting, diarrhea, burning or pain with urination, changes to his urinary habits, blood in his stool or urine.  He denies fever, cough, chills.  He reports that he does not think he takes naltrexone.  Social history: Patient is a former tobacco user, quitting smoking about 2 to 3 years ago.  He denies EtOH and recreational drug use.  He currently lives with his father as he is disabled.  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  ROS: Constitutional: no weight change, no fever ENT/Mouth: no sore throat, no rhinorrhea Eyes: no eye pain, no vision changes Cardiovascular: no chest pain, no dyspnea,  no edema, no palpitations Respiratory: no cough, no sputum, no wheezing Gastrointestinal: no nausea, no vomiting, no diarrhea, no constipation Genitourinary: no urinary incontinence, no dysuria, no hematuria Musculoskeletal: no arthralgias, no myalgias Skin: no skin lesions, no pruritus, Neuro: + weakness, no loss of consciousness, no syncope, + dizziness Psych: no anxiety, no depression, + decrease appetite Heme/Lymph: no bruising, no bleeding  ED Course: Discussed with EDP, patient requiring hospitalization for chief concerns of atrial fibrillation with RVR.  Assessment/Plan  Principal Problem:   Atrial fibrillation with RVR (HCC) Active Problems:   Chronic obstructive pulmonary disease (COPD) (HCC)   NICM (nonischemic cardiomyopathy) (HCC)   Type 2 diabetes mellitus with hyperlipidemia (HCC)   Type II diabetes mellitus with renal manifestations (HCC)   OSA on CPAP   Depression, recurrent (HCC)   Assessment and Plan:  * Atrial fibrillation with RVR (HCC) Etiology workup in progress Check TSH and complete echo on admission Currently no  symptoms reported by patient concerning for infectious etiology Chest x-ray ordered by EDP negative for concerns of pneumonia Cardiology has been consulted for atrial  fibrillation and heart failure medication optimization Diltiazem 15 mg every 6 hours as needed for heart rate greater than 120, 2 doses ordered on admission Admit to telemetry cardiac, inpatient  Depression, recurrent (HCC) Home fluoxetine 20 mg every evening resumed Bupropion 150 mg daily resumed  OSA on CPAP CPAP nightly ordered on admission  Type II diabetes mellitus with renal manifestations (HCC) Home long-acting insulin 36 units nightly resumed Insulin SSI with at bedtime coverage ordered Home Ozempic will not be resumed on admission  Type 2 diabetes mellitus with hyperlipidemia (HCC) Home fenofibrate equivalent resumed on admission Rosuvastatin 20 mg nightly resumed  AM team to complete med reconciliation  Chart reviewed.   DVT prophylaxis: Home Xarelto resumed Code Status: Full code Diet: Heart healthy Family Communication: No Disposition Plan: Pending clinical course Consults called: Cardiology Admission status: Telemetry cardiac, inpatient  Past Medical History:  Diagnosis Date   Aortic root dilation (HCC) 11/28/2016   a.) TTE 11/28/2016: 43 mm; b.) TTE 11/23/2019: 38 mm; c.) TTE 08/14/2022: 40 mm; d.) TTE 03/08/2023: 43 mm   Arteriosclerosis of right carotid artery    a.) carotid doppler 04/29/2017: <50% RICA   Bronchitis 07/2023   CAD (coronary artery disease)    a.) MV 04/2015: low risk; b.) LHC 01/06/2017: minor irregs in LAD/Diag/LCX/OM, RCA 40p/m/d; c.) R/LHC 06/13/2020: 50% dLAD, 30% p-mLCx, 40% OM1, 100% pRCA (L-R collats) - med mgmt; d.) R/LHC 08/19/2023: 50% dLAD, 30% p-mLCx, 40% OM1, 100% pRCA (L-R collats), 50% p-mRCA - med mgmt   Chest wall pain, chronic    Chronic 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    Chronic Troponin Elevation    CKD (chronic kidney disease), stage II-III    COPD (chronic obstructive pulmonary disease) (HCC)    Depression    DM (diabetes mellitus), type 2 (HCC)    HFrEF (heart failure with reduced ejection fraction) (HCC)    a.) EF  03/20/2015: EF 45-50%; b.) MV 05/19/2015: EF 30-44%; c.) TTE 01/04/2016: EF 20-25%; d.) TTE 02/29/2016: EF 30-35%; e.) TTE 11/28/2016: EF 40-45%; f.) LHC 01/06/2017: EF 25-35%; g.) TTE 04/29/2017: EF 45-50%; h.) TTE 06/26/2019: EF 30-35%; i.) TTE 11/23/2019: EF 25-30%; j.) TTE 06/08/2020: EF 35-40%; k.) TTE 05/21/2021: EF 40-45%; l.) TTE 08/14/2022: EF 40-45%; m.) TTE 03/08/2023: EF 40-45%   History of 2019 novel coronavirus disease (COVID-19) 03/01/2022   History of methicillin resistant staphylococcus aureus (MRSA)    Hyperlipidemia    Hypertension    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 12/2015 noted on tele-->amio;  b. 12/2015 Event monitor: no VT noted.   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  12/31/19: RUL and RLL PEs.   Stroke [facility])    pt states per mri in 2023-unsure of when stroke happened   Syncope    a. 01/2016 - felt to be vasovagal.   Thrombocytopenia (HCC)    Past Surgical History:  Procedure Laterality Date   COLONOSCOPY WITH PROPOFOL N/A 08/01/2022   Procedure: COLONOSCOPY WITH PROPOFOL;  Surgeon: Toney Reil, MD;  Location: [facility] ENDOSCOPY;  Service: Gastroenterology;  Laterality: N/A;   ESOPHAGOGASTRODUODENOSCOPY (EGD) WITH PROPOFOL N/A 08/01/2022   Procedure: ESOPHAGOGASTRODUODENOSCOPY (EGD) WITH PROPOFOL;  Surgeon: Toney Reil, MD;  Location: [facility] ENDOSCOPY;  Service: Gastroenterology;  Laterality: N/A;   ESOPHAGOGASTRODUODENOSCOPY (EGD) WITH PROPOFOL N/A 10/16/2022   Procedure: ESOPHAGOGASTRODUODENOSCOPY (EGD) WITH PROPOFOL;  Surgeon: Toney Reil, MD;  Location: [facility] ENDOSCOPY;  Service: Gastroenterology;  Laterality: N/A;   FINGER AMPUTATION Left    Traumatic   LEFT HEART CATH AND CORONARY ANGIOGRAPHY N/A 01/06/2017   Procedure: Left Heart Cath and Coronary Angiography;  Surgeon: Iran Ouch, MD;  Location: [facility] INVASIVE CV LAB;  Service: Cardiovascular;  Laterality: N/A;   RIGHT/LEFT HEART CATH AND CORONARY ANGIOGRAPHY N/A 06/13/2020   Procedure: RIGHT/LEFT HEART CATH AND CORONARY ANGIOGRAPHY;  Surgeon: Yvonne Kendall, MD;  Location: [facility] INVASIVE CV LAB;  Service: Cardiovascular;  Laterality: N/A;   RIGHT/LEFT HEART CATH AND CORONARY ANGIOGRAPHY Bilateral 08/19/2023   Procedure: RIGHT/LEFT HEART CATH AND CORONARY ANGIOGRAPHY;  Surgeon: Yvonne Kendall, MD;  Location: [facility] INVASIVE CV LAB;  Service: Cardiovascular;  Laterality: Bilateral;   SHOULDER ARTHROSCOPY WITH SUBACROMIAL DECOMPRESSION, ROTATOR CUFF REPAIR AND BICEP TENDON REPAIR Left 09/16/2023   Procedure: SHOULDER ARTHROSCOPY WITH DEBRIDEMENT, DECOMPRESSION, ROTATOR CUFF REPAIR AND BICEPS  TENODESIS.;  Surgeon: Christena Flake, MD;  Location: [facility] ORS;  Service: Orthopedics;  Laterality: Left;   SUBQ ICD IMPLANT N/A 05/20/2022   Procedure: SUBQ ICD IMPLANT;  Surgeon: Lanier Prude, MD;  Location: [facility] INVASIVE CV LAB;  Service: Cardiovascular;  Laterality: N/A;   Social History:  reports that he quit smoking about 3 years ago. His smoking use included cigarettes. He started smoking about 36 years ago. He has a 16.5 pack-year smoking history. He quit smokeless tobacco use about 3 years ago. He reports that he does not currently use alcohol. He reports that he does not use drugs.  Allergies  Allergen Reactions   Metformin And Related Nausea And Vomiting   Zantac [Ranitidine Hcl] Diarrhea and Nausea Only    Night sweats   Family History  Problem Relation Age of Onset   Diabetes Mother    Diabetes Mellitus II Mother    Hypothyroidism Mother    Hypertension Mother    Kidney failure Mother        Dialysis   Heart attack Mother        35 yo approximately   Hypertension Father  Gout Father    Cancer Maternal Grandfather    Diabetes Maternal Grandfather    Cancer Paternal Aunt    Family history: Family history reviewed and not pertinent.  Prior to Admission medications   Medication Sig Start Date End Date Taking? Authorizing Provider  Accu-Chek Softclix Lancets lancets USE UP TO FOUR TIMES DAILY 07/10/22   [provider]  albuterol (VENTOLIN HFA) 108 (90 Base) MCG/ACT inhaler Inhale 2 puffs into the lungs every 4 (four) hours as needed for wheezing or shortness of breath. 10/21/22   Raechel Chute, MD  allopurinol (ZYLOPRIM) 300 MG tablet Take 300 mg by mouth every morning.    [provider]  aspirin EC 81 MG tablet Take 81 mg by mouth every morning.    [provider]  azelastine (ASTELIN) 0.1 % nasal spray Place 2 sprays into both nostrils 2 (two) times daily. Use in each nostril as directed 07/31/23   Sallee Provencal, FNP  BD PEN NEEDLE  NANO 2ND GEN 32G X 4 MM MISC USE  AS DIRECTED AT BEDTIME 02/03/23   Jacky Kindle, FNP  blood glucose meter kit and supplies KIT Dispense based on patient and insurance preference. Use up to four times daily as directed. 07/08/22   Jacky Kindle, FNP  butalbital-acetaminophen-caffeine (FIORICET) [phone] MG tablet Take 1 tablet by mouth every 4 (four) hours as needed for migraine. 10/14/23   [provider]  carvedilol (COREG) 12.5 MG tablet Take 1 tablet (12.5 mg total) by mouth 2 (two) times daily. 10/07/23 01/05/24  Delma Freeze, FNP  cetirizine (ZYRTEC) 10 MG tablet Take 1 tablet (10 mg total) by mouth every morning. 09/16/23   Poggi, Excell Seltzer, MD  colchicine 0.6 MG tablet Take 1 tablet (0.6 mg total) by mouth daily as needed (gout flare). 09/16/23   Poggi, Excell Seltzer, MD  Continuous Glucose Sensor (FREESTYLE LIBRE 3 SENSOR) MISC PLACE 1 SENSOR ON TO THE SKIN EVERY 14 DAYS. USE TO CHECK BLOOD SUGAR CONTINUEOUSLY.    [provider]  Continuous Glucose Sensor (FREESTYLE LIBRE 3 SENSOR) MISC 1 Application by Does not apply route every 14 (fourteen) days. Use to check blood sugar continuously. 07/11/23   Jacky Kindle, FNP  Continuous Glucose Sensor (FREESTYLE LIBRE 3 SENSOR) MISC Apply sensor every 14 (fourteen) days to check blood sugar continuously. 09/23/23   Sallee Provencal, FNP  dapagliflozin propanediol (FARXIGA) 10 MG TABS tablet Take 1 tablet (10 mg total) by mouth daily. 07/07/23   Delma Freeze, FNP  fenofibrate (TRICOR) 145 MG tablet Take 1 tablet (145 mg total) by mouth every evening. 09/16/23   Poggi, Excell Seltzer, MD  FLUoxetine (PROZAC) 20 MG capsule Take 1 capsule (20 mg total) by mouth every evening. 09/16/23   Poggi, Excell Seltzer, MD  fluticasone (FLONASE) 50 MCG/ACT nasal spray Place 2 sprays into both nostrils daily as needed for allergies or rhinitis. Home med. 09/16/23   Poggi, Excell Seltzer, MD  Galcanezumab-gnlm 120 MG/ML SOAJ Inject 120 mg into the skin. Monthly 10/14/23   [provider]  Insulin Glargine (BASAGLAR KWIKPEN) 100 UNIT/ML Inject 36 Units into the skin at bedtime. 07/11/23 11/22/24  Jacky Kindle, FNP  insulin lispro (HUMALOG) 100 UNIT/ML injection Inject 10 Units into the skin 3 (three) times daily before meals.    [provider]  Insulin Pen Needle 32G X 4 MM MISC 1 each by Does not apply route in the morning, at noon, in the evening, and  at bedtime. 02/05/23   Jacky Kindle, FNP  ipratropium-albuterol (DUONEB) 0.5-2.5 (3) MG/3ML SOLN Take 3 mLs by nebulization every 6 (six) hours as needed. 07/31/23   Sallee Provencal, FNP  isosorbide mononitrate (IMDUR) 30 MG 24 hr tablet Take 0.5 tablets (15 mg total) by mouth every morning. 09/16/23 09/15/24  Poggi, Excell Seltzer, MD  meclizine (ANTIVERT) 25 MG tablet Take 0.5 tablets (12.5 mg total) by mouth 3 (three) times daily as needed for dizziness. 09/16/23   Poggi, Excell Seltzer, MD  metoCLOPramide (REGLAN) 5 MG tablet Take 1 tablet (5 mg total) by mouth every 8 (eight) hours as needed for nausea. 04/14/23   Toney Reil, MD  naltrexone (DEPADE) 50 MG tablet Take 1 tablet (50 mg total) by mouth every morning. 09/16/23   Poggi, Excell Seltzer, MD  nitroGLYCERIN (NITROSTAT) 0.4 MG SL tablet Place 1 tablet (0.4 mg total) under the tongue every 5 (five) minutes x 3 doses as needed for chest pain. 09/17/22   Jacky Kindle, FNP  omeprazole (PRILOSEC) 40 MG capsule Take 1 capsule (40 mg total) by mouth every morning. 09/16/23   Poggi, Excell Seltzer, MD  oxyCODONE (ROXICODONE) 5 MG immediate release tablet Take 1-2 tablets (5-10 mg total) by mouth every 4 (four) hours as needed for moderate pain (pain score 4-6) or severe pain (pain score 7-10). 09/16/23   Poggi, Excell Seltzer, MD  potassium chloride SA (KLOR-CON M) 20 MEQ tablet Take 1 tablet (20 mEq total) by mouth every other day. AM 09/16/23   Poggi, Excell Seltzer, MD  REZDIFFRA 100 MG TABS TAKE 1 TABLET BY MOUTH DAILY WITH OR WITHOUT FOOD 10/20/23   Toney Reil, MD  rivaroxaban (XARELTO) 20  MG TABS tablet Take 1 tablet (20 mg total) by mouth daily with supper. 07/01/23   Bensimhon, Bevelyn Buckles, MD  rosuvastatin (CRESTOR) 20 MG tablet Take 1 tablet (20 mg total) by mouth at bedtime. 09/16/23   Poggi, Excell Seltzer, MD  sacubitril-valsartan (ENTRESTO) 97-103 MG Take 1 tablet by mouth 2 (two) times daily. 09/16/23   Poggi, Excell Seltzer, MD  Semaglutide, 2 MG/DOSE, (OZEMPIC, 2 MG/DOSE,) 8 MG/3ML SOPN Inject 2 mg into the skin once a week. Tuesdays 09/16/23   Poggi, Excell Seltzer, MD  spironolactone (ALDACTONE) 25 MG tablet Take 1 tablet by mouth once daily 09/09/23   Clarisa Kindred A, FNP  TALTZ 80 MG/ML SOAJ Inject 3 mLs into the skin every 28 (twenty-eight) days. 02/10/23   [provider]  torsemide (DEMADEX) 20 MG tablet Take 1 tablet (20 mg total) by mouth every morning. 09/16/23   Poggi, Excell Seltzer, MD  umeclidinium-vilanterol (ANORO ELLIPTA) 62.5-25 MCG/ACT AEPB Inhale 1 puff into the lungs every morning. 09/16/23   PoggiExcell Seltzer, MD   Physical Exam: Vitals:   11/03/23 1330 11/03/23 1520 11/03/23 1607 11/03/23 1743  BP: 110/79   96/60  Pulse: 100   83  Resp: (!) 22   (!) 23  Temp:   98.2 F (36.8 C)   TempSrc:   Oral   SpO2: 99%   96%  Weight:  104.3 kg    Height:  5\' 11"  (1.803 m)     Constitutional: appears older than chronological age, calm Eyes: PERRL, lids and conjunctivae normal ENMT: Mucous membranes are moist. Posterior pharynx clear of any exudate or lesions. Age-appropriate dentition. Hearing appropriate Neck: normal, supple, no masses, no thyromegaly Respiratory: clear to auscultation bilaterally, no wheezing, no crackles. Normal respiratory effort. No accessory muscle use.  Cardiovascular: Tachycardia, with irregular rhythm, no murmurs / rubs / gallops. No extremity edema. 2+ pedal pulses. No carotid bruits.  Abdomen: Obese abdomen, no tenderness, no masses palpated, no hepatosplenomegaly. Bowel sounds positive.  Musculoskeletal: no clubbing / cyanosis. No joint deformity upper and  lower extremities. Good ROM, no contractures, no atrophy. Normal muscle tone.  Skin: no rashes, lesions, ulcers. No induration Neurologic: Sensation intact. Strength 5/5 in all 4.  Psychiatric: Normal judgment and insight. Alert and oriented x 3.  Depressed mood.  Flat affect  EKG: independently reviewed, showing atrial fibrillation with RVR with a rate of 148, QTc 483  Chest x-ray on Admission: I personally reviewed and I agree with radiologist reading as below.  CT Angio Chest PE W/Cm &/Or Wo Cm Result Date: 11/03/2023 CLINICAL DATA:  Atrial fibrillation, tachycardia, short of breath, hypotension, chest pain EXAM: CT ANGIOGRAPHY CHEST WITH CONTRAST TECHNIQUE: Multidetector CT imaging of the chest was performed using the standard protocol during bolus administration of intravenous contrast. Multiplanar CT image reconstructions and MIPs were obtained to evaluate the vascular anatomy. RADIATION DOSE REDUCTION: This exam was performed according to the departmental dose-optimization program which includes automated exposure control, adjustment of the mA and/or kV according to patient size and/or use of iterative reconstruction technique. CONTRAST:  75mL OMNIPAQUE IOHEXOL 350 MG/ML SOLN COMPARISON:  07/22/2023 FINDINGS: Cardiovascular: This is a technically adequate evaluation of the pulmonary vasculature. No filling defects or pulmonary emboli. Mild cardiomegaly without pericardial effusion. No evidence of thoracic aortic aneurysm or dissection. Atherosclerosis of the aorta and coronary vasculature. Mediastinum/Nodes: No enlarged mediastinal, hilar, or axillary lymph nodes. Thyroid gland, trachea, and esophagus demonstrate no significant findings. Lungs/Pleura: No acute airspace disease, effusion, or pneumothorax. Central airways are patent. There is a 5 mm left upper lobe pulmonary nodule reference image 60/6, stable. Upper Abdomen: No acute abnormality. Musculoskeletal: Defibrillator is seen within the left  lateral chest wall, lead within the anterior chest wall along the left margin of the sternum. There is a significant left shoulder effusion, with bony destruction along the superolateral margin of the humeral head. Please correlate with any prior history of shoulder trauma as this could reflect healing fracture and posttraumatic effusion. Otherwise, underlying septic arthritis and osteomyelitis could be considered. No other acute bony abnormalities. The reconstructed images demonstrate no additional findings. Review of the MIP images confirms the above findings. IMPRESSION: 1. No evidence of pulmonary embolus. 2. Significant left shoulder effusion with bony destruction along the superolateral margin of the left humeral head. Please correlate with any history of recent trauma, otherwise septic arthritis and osteomyelitis should be considered. 3. Cardiomegaly without pericardial effusion. 4. Aortic Atherosclerosis (ICD10-I70.0). Coronary artery atherosclerosis. Electronically Signed   By: Sharlet Salina M.D.   On: 11/03/2023 15:53   DG Chest 2 View Result Date: 11/03/2023 CLINICAL DATA:  Chest pain EXAM: CHEST - 2 VIEW COMPARISON:  10/22/2023 FINDINGS: External defibrillator lead overlies the anterior left chest. Stable heart size. Mildly prominent bibasilar interstitial markings. Lungs appear otherwise clear. No pleural effusion or pneumothorax. IMPRESSION: Mildly prominent bibasilar interstitial markings, which may reflect bronchitic type lung changes. Electronically Signed   By: Duanne Guess D.O.   On: 11/03/2023 11:46   Labs on Admission: I have personally reviewed following labs  CBC: Recent Labs  Lab 11/03/23 1027  WBC 11.0*  HGB 16.0  HCT 50.1  MCV 80.5  PLT 415*   Basic Metabolic Panel: Recent Labs  Lab 11/03/23 1142  NA 133*  K 4.0  CL  102  CO2 19*  GLUCOSE 128*  BUN 30*  CREATININE 1.20  CALCIUM 8.7*  MG 1.9  PHOS 3.9   GFR: Estimated Creatinine Clearance: 86.5 mL/min (by  C-G formula based on SCr of 1.2 mg/dL).  Coagulation Profile: Recent Labs  Lab 11/03/23 1027  INR 1.9*   Urine analysis:    Component Value Date/Time   COLORURINE YELLOW (A) 01/17/2022 1953   APPEARANCEUR CLEAR (A) 01/17/2022 1953   APPEARANCEUR Clear 03/08/2020 0951   LABSPEC 1.012 01/17/2022 1953   LABSPEC 1.019 11/12/2012 1713   PHURINE 5.0 01/17/2022 1953   GLUCOSEU >=500 (A) 01/17/2022 1953   GLUCOSEU Negative 11/12/2012 1713   HGBUR NEGATIVE 01/17/2022 1953   BILIRUBINUR NEGATIVE 01/17/2022 1953   BILIRUBINUR Negative 03/08/2020 0951   BILIRUBINUR Negative 11/12/2012 1713   KETONESUR NEGATIVE 01/17/2022 1953   PROTEINUR NEGATIVE 01/17/2022 1953   NITRITE NEGATIVE 01/17/2022 1953   LEUKOCYTESUR NEGATIVE 01/17/2022 1953   LEUKOCYTESUR Negative 11/12/2012 1713   This document was prepared using Dragon Voice Recognition software and may include unintentional dictation errors.  Dr. Sedalia Muta [facility]  If 7PM-7AM, please contact overnight-coverage provider If 7AM-7PM, please contact day attending provider [email or web address]  11/03/2023, 5:58 PM

## 2023-11-03 NOTE — ED Triage Notes (Signed)
 Pt here from the HF clinic for a new appt with a fib RVR in the 140s. Pt has been c/o SOB and low bp. Pt endorses cp as well. Pt was told to hold his Ozempic but he took a dose yesterday.

## 2023-11-03 NOTE — Assessment & Plan Note (Signed)
 Home fenofibrate equivalent resumed on admission Rosuvastatin 20 mg nightly resumed

## 2023-11-03 NOTE — Hospital Course (Signed)
 Mr. Vikram Tillett, Montez Hageman. is a 53 year old male with history of CAD, non-insulin-dependent diabetes mellitus, hypertension, history of PE on Eliquis, history of nonsustained V. tach status post AICD, who presents emergency department for chief concerns of dizziness and atrial fibrillation.  Vitals in the ED showed temperature 98.1, respiration rate of 25 improved to 18, heart rate 62, with elevation to 108, blood pressure 108/75, improved to 110/79, SpO2 of 99% on room air.  Serum sodium is 133, potassium 4.0, chloride 102, bicarb 19, BUN of 30, serum creatinine of 1.20, eGFR > 60, nonfasting blood glucose 128, WBC 11, hemoglobin 16, platelets of 415.  High sensitive troponin was 27 and on repeat was 24.  BNP was 164.1.  COVID/influenza A/influenza B/RSV PCR were negative.  ED treatment: Acetaminophen 1000 mg p.o. one-time dose, diltiazem 15 mg IV one-time dose, ondansetron 4 mg IV one-time dose, sodium chloride 500 mL liter bolus.

## 2023-11-03 NOTE — ED Provider Notes (Signed)
 [facility] Provider Note    Event Date/Time   First MD Initiated Contact with Patient 11/03/23 1018     (approximate)   History   Tachycardia   HPI  Gabriel [name]. is a 53 y.o. male with history of heart failure, history of nonsustained VT status post AICD, CAD, diabetes, hypertension, history of PE on Xarelto presenting with tachycardia.  Patient states that they went to physical therapy today, went to see his cardiologist.  Had not taken any medications today.  Was having some chest pain and cough.  Does have shortness of breath that he says is at baseline.  Denies any fevers or new leg swelling.  Was sent in by his cardiologist due to his elevated heart rate.   On independent chart review, he was seen by his cardiologist today, he was told to hold his carvedilol for 2 doses and sent to the emergency department for A-fib with RVR.  On independent review, he has been on Eliquis in the past but had a recurrent PE in the setting of Eliquis noncompliance and switched to Xarelto.     Physical Exam   Triage Vital Signs: ED Triage Vitals  Encounter Vitals Group     BP 11/03/23 1016 108/65     Systolic BP Percentile --      Diastolic BP Percentile --      Pulse Rate 11/03/23 1016 68     Resp 11/03/23 1016 (!) 21     Temp 11/03/23 1016 98.1 F (36.7 C)     Temp Source 11/03/23 1016 Oral     SpO2 11/03/23 1016 99 %     Weight --      Height --      Head Circumference --      Peak Flow --      Pain Score 11/03/23 1017 9     Pain Loc --      Pain Education --      Exclude from Growth Chart --     Most recent vital signs: Vitals:   11/03/23 1300 11/03/23 1330  BP: 95/70 110/79  Pulse: (!) 102 100  Resp: (!) 30 (!) 22  Temp:    SpO2: 90% 99%     General: Awake, no distress.  CV:  Good peripheral perfusion.  Resp:  Normal effort.  Tachypneic Abd:  No distention.  Soft nontender Other:  No lower extremity edema, no unilateral calf  swelling or tenderness.   ED Results / Procedures / Treatments   Labs (all labs ordered are listed, but only abnormal results are displayed) Labs Reviewed  CBC - Abnormal; Notable for the following components:      Result Value   WBC 11.0 (*)    RBC 6.22 (*)    MCH 25.7 (*)    Platelets 415 (*)    All other components within normal limits  BRAIN NATRIURETIC PEPTIDE - Abnormal; Notable for the following components:   B Natriuretic Peptide 164.1 (*)    All other components within normal limits  PROTIME-INR - Abnormal; Notable for the following components:   Prothrombin Time 22.1 (*)    INR 1.9 (*)    All other components within normal limits  BASIC METABOLIC PANEL - Abnormal; Notable for the following components:   Sodium 133 (*)    CO2 19 (*)    Glucose, Bld 128 (*)    BUN 30 (*)    Calcium 8.7 (*)  All other components within normal limits  TROPONIN I (HIGH SENSITIVITY) - Abnormal; Notable for the following components:   Troponin I (High Sensitivity) 27 (*)    All other components within normal limits  TROPONIN I (HIGH SENSITIVITY) - Abnormal; Notable for the following components:   Troponin I (High Sensitivity) 24 (*)    All other components within normal limits  RESP PANEL BY RT-PCR (RSV, FLU A&B, COVID)  RVPGX2  MAGNESIUM  PHOSPHORUS     EKG  Atrial fibrillation with RVR, rate 148, normal QRS, normal QTc, mild ST depression to 2, no obvious ischemic ST elevation, this is change compared to prior since prior EKG showed sinus rhythm   RADIOLOGY EP study on my interpretation without obvious PE   PROCEDURES:  Critical Care performed: Yes, see critical care procedure note(s)  .Critical Care  Performed by: Claybon Jabs, MD Authorized by: Claybon Jabs, MD   Critical care provider statement:    Critical care time (minutes):  40   Critical care was necessary to treat or prevent imminent or life-threatening deterioration of the following conditions:  Cardiac  failure   Critical care was time spent personally by me on the following activities:  Development of treatment plan with patient or surrogate, discussions with consultants, evaluation of patient's response to treatment, examination of patient, ordering and review of laboratory studies, ordering and review of radiographic studies, ordering and performing treatments and interventions, pulse oximetry, re-evaluation of patient's condition and review of old charts    MEDICATIONS ORDERED IN ED: Medications  diltiazem (CARDIZEM) injection 15 mg (15 mg Intravenous Given 11/03/23 1045)  acetaminophen (TYLENOL) tablet 1,000 mg (1,000 mg Oral Given 11/03/23 1148)  ondansetron (ZOFRAN) injection 4 mg (4 mg Intravenous Given 11/03/23 1214)  sodium chloride 0.9 % bolus 500 mL (500 mLs Intravenous New Bag/Given 11/03/23 1215)  iohexol (OMNIPAQUE) 350 MG/ML injection 75 mL (75 mLs Intravenous Contrast Given 11/03/23 1403)     IMPRESSION / MDM / ASSESSMENT AND PLAN / ED COURSE  I reviewed the triage vital signs and the nursing notes.                              Differential diagnosis includes, but is not limited to, electrolyte derangements, CHF, ACS, also considered PE, pneumonia, viral illness, worsening arrhythmia.  Will get labs, EKG, troponin, chest x-ray, will give him some IV Dilt.  Will get a CT PE study.  Bedside echo was done, no obvious B-lines bilaterally, IVC is collapsing less than 50%.  No obvious pericardial effusion, RV appears less than LV, he does have diminished contractility.  Patient's presentation is most consistent with acute presentation with potential threat to life or bodily function.  Independent review of labs imaging are below.  Given his A-fib with RVR when he first presented as well as his soft blood pressures and extensive cardiac history, he will need to be admitted for further evaluation as well as cardiology consultation.  Consulted hospitalist who is agreeable plan for  admission and will evaluate the patient.  He is admitted.  Clinical Course as of 11/03/23 1426  Mon Nov 03, 2023  1104 On reassessment heart rate after initial dilt is 100. [TT]  1211 Patient feeling little bit nauseous, blood pressure in the mid 80s, heart rate in the 100s.  Will give him some fluids, Zofran.  Reassess.  He will likely need to be admitted. [TT]  1310 DG Chest  2 View IMPRESSION: Mildly prominent bibasilar interstitial markings, which may reflect bronchitic type lung changes.   [TT]  1310 Independent review of labs, creatinine is 1.2, sodium is mildly low, rest electrolytes not severely deranged, mild leukocytosis, troponin is mildly elevated as well as BNP, respiratory viral plate is negative, phosphorus and magnesium are normal. [TT]    Clinical Course User Index [TT] Jodie Echevaria, Franchot Erichsen, MD     FINAL CLINICAL IMPRESSION(S) / ED DIAGNOSES   Final diagnoses:  Atrial fibrillation with RVR (HCC)  Shortness of breath  Chest pain, unspecified type     Rx / DC Orders   ED Discharge Orders     None        Note:  This document was prepared using Dragon voice recognition software and may include unintentional dictation errors.    Claybon Jabs, MD 11/03/23 [phone]

## 2023-11-03 NOTE — Telephone Encounter (Signed)
 Spoke to pt. He's not taking any entresto. Agreeable to appointment this morning after cardiac rehab to discuss bp and meds.

## 2023-11-03 NOTE — Assessment & Plan Note (Addendum)
 Home long-acting insulin 36 units nightly resumed Insulin SSI with at bedtime coverage ordered Home Ozempic will not be resumed on admission

## 2023-11-04 ENCOUNTER — Inpatient Hospital Stay (HOSPITAL_COMMUNITY)
Admit: 2023-11-04 | Discharge: 2023-11-04 | Disposition: A | Discharge status: Home / Self Care | Attending: Internal Medicine | Admitting: Internal Medicine

## 2023-11-04 DIAGNOSIS — I428 Other cardiomyopathies: Secondary | ICD-10-CM | POA: Diagnosis not present

## 2023-11-04 DIAGNOSIS — G4730 Sleep apnea, unspecified: Secondary | ICD-10-CM

## 2023-11-04 DIAGNOSIS — J449 Chronic obstructive pulmonary disease, unspecified: Secondary | ICD-10-CM

## 2023-11-04 DIAGNOSIS — I5023 Acute on chronic systolic (congestive) heart failure: Secondary | ICD-10-CM

## 2023-11-04 DIAGNOSIS — E1169 Type 2 diabetes mellitus with other specified complication: Secondary | ICD-10-CM

## 2023-11-04 DIAGNOSIS — I4891 Unspecified atrial fibrillation: Secondary | ICD-10-CM

## 2023-11-04 DIAGNOSIS — E785 Hyperlipidemia, unspecified: Secondary | ICD-10-CM

## 2023-11-04 LAB — CBC
HCT: 39.9 % (ref 39.0–52.0)
Hemoglobin: 12.9 g/dL — ABNORMAL LOW (ref 13.0–17.0)
MCH: 25.7 pg — ABNORMAL LOW (ref 26.0–34.0)
MCHC: 32.3 g/dL (ref 30.0–36.0)
MCV: 79.6 fL — ABNORMAL LOW (ref 80.0–100.0)
Platelets: 281 10*3/uL (ref 150–400)
RBC: 5.01 MIL/uL (ref 4.22–5.81)
RDW: 15.4 % (ref 11.5–15.5)
WBC: 8.2 10*3/uL (ref 4.0–10.5)
nRBC: 0 % (ref 0.0–0.2)

## 2023-11-04 LAB — GLUCOSE, CAPILLARY: Glucose-Capillary: 159 mg/dL — ABNORMAL HIGH (ref 70–99)

## 2023-11-04 LAB — BASIC METABOLIC PANEL
Anion gap: 8 (ref 5–15)
BUN: 23 mg/dL — ABNORMAL HIGH (ref 6–20)
CO2: 22 mmol/L (ref 22–32)
Calcium: 8.3 mg/dL — ABNORMAL LOW (ref 8.9–10.3)
Chloride: 103 mmol/L (ref 98–111)
Creatinine, Ser: 1.2 mg/dL (ref 0.61–1.24)
GFR, Estimated: >60 mL/min (ref >60)
Glucose, Bld: 104 mg/dL — ABNORMAL HIGH (ref 70–99)
Potassium: 3.4 mmol/L — ABNORMAL LOW (ref 3.5–5.1)
Sodium: 133 mmol/L — ABNORMAL LOW (ref 135–145)

## 2023-11-04 LAB — ECHOCARDIOGRAM COMPLETE
AR max vel: 3.22 cm2
AV Area VTI: 3.18 cm2
AV Area mean vel: 2.91 cm2
AV Mean grad: 3.5 mmHg
AV Peak grad: 5.3 mmHg
Ao pk vel: 1.16 m/s
Area-P 1/2: 8.16 cm2
Height: 71 in
MV VTI: 3.69 cm2
S' Lateral: 5.4 cm
Weight: 3679.04 [oz_av]

## 2023-11-04 LAB — CBG MONITORING, ED
Glucose-Capillary: 144 mg/dL — ABNORMAL HIGH (ref 70–99)
Glucose-Capillary: 195 mg/dL — ABNORMAL HIGH (ref 70–99)

## 2023-11-04 MED ORDER — AMIODARONE HCL IN DEXTROSE 360-4.14 MG/200ML-% IV SOLN
30.0000 mg/h | INTRAVENOUS | Status: DC
  Administered 2023-11-04 – 2023-11-07 (×5): 30 mg/h via INTRAVENOUS
  Filled 2023-11-04 (×6): qty 200

## 2023-11-04 MED ORDER — PERFLUTREN LIPID MICROSPHERE
1.0000 mL | INTRAVENOUS | Status: AC | PRN
  Administered 2023-11-04: 5 mL via INTRAVENOUS

## 2023-11-04 MED ORDER — PROMETHAZINE HCL 25 MG/ML IJ SOLN
25.0000 mg | Freq: Once | INTRAMUSCULAR | Status: AC
  Administered 2023-11-04: 25 mg via INTRAVENOUS
  Filled 2023-11-04: qty 1

## 2023-11-04 MED ORDER — AMIODARONE LOAD VIA INFUSION
150.0000 mg | Freq: Once | INTRAVENOUS | Status: AC
  Administered 2023-11-04: 150 mg via INTRAVENOUS
  Filled 2023-11-04: qty 83.34

## 2023-11-04 MED ORDER — FUROSEMIDE 10 MG/ML IJ SOLN
80.0000 mg | Freq: Once | INTRAMUSCULAR | Status: AC
  Administered 2023-11-04: 80 mg via INTRAVENOUS
  Filled 2023-11-04: qty 8

## 2023-11-04 MED ORDER — EMPAGLIFLOZIN 10 MG PO TABS
10.0000 mg | ORAL_TABLET | Freq: Every day | ORAL | Status: DC
  Filled 2023-11-04: qty 1

## 2023-11-04 MED ORDER — POTASSIUM CHLORIDE CRYS ER 20 MEQ PO TBCR
40.0000 meq | EXTENDED_RELEASE_TABLET | Freq: Once | ORAL | Status: AC
  Administered 2023-11-04: 40 meq via ORAL
  Filled 2023-11-04: qty 2

## 2023-11-04 MED ORDER — AMIODARONE HCL IN DEXTROSE 360-4.14 MG/200ML-% IV SOLN
60.0000 mg/h | INTRAVENOUS | Status: DC
  Administered 2023-11-04 (×2): 60 mg/h via INTRAVENOUS
  Filled 2023-11-04 (×2): qty 200

## 2023-11-04 MED ORDER — POTASSIUM CHLORIDE CRYS ER 20 MEQ PO TBCR
20.0000 meq | EXTENDED_RELEASE_TABLET | Freq: Once | ORAL | Status: AC
  Administered 2023-11-04: 20 meq via ORAL
  Filled 2023-11-04: qty 1

## 2023-11-04 MED ORDER — ONDANSETRON HCL 4 MG/2ML IJ SOLN
4.0000 mg | Freq: Four times a day (QID) | INTRAMUSCULAR | Status: DC | PRN
  Administered 2023-11-04: 4 mg via INTRAVENOUS
  Filled 2023-11-04: qty 2

## 2023-11-04 MED ORDER — SPIRONOLACTONE 25 MG PO TABS
25.0000 mg | ORAL_TABLET | Freq: Every day | ORAL | Status: DC
  Administered 2023-11-04 – 2023-11-07 (×4): 25 mg via ORAL
  Filled 2023-11-04 (×4): qty 1

## 2023-11-04 MED ORDER — MAGNESIUM SULFATE 2 GM/50ML IV SOLN
2.0000 g | Freq: Once | INTRAVENOUS | Status: AC
Start: 2023-11-04 — End: 2023-11-04
  Administered 2023-11-04: 2 g via INTRAVENOUS
  Filled 2023-11-04: qty 50

## 2023-11-04 NOTE — TOC Initial Note (Signed)
 Transition of Care (TOC) - Initial/Assessment Note    Patient Details  Name: Gabriel [name]. MRN: [id number] Date of Birth: [birth year]  Transition of Care [facility]) CM/SW Contact:    Colin Broach, LCSW Phone Number: 11/04/2023, 8:19 PM  Clinical Narrative:                 CSW introduced self and reason for call.  Demographic and insurance information verified. Readmission prevention screening completed.  Patient's PCP is Charlcie Cradle, MD.  He states that he uses [facility] for his pharmacy needs and he has no concerns with getting his medications.  Patient is currently living with his dad.  He states that he has a CPAP machine that he uses at home, but he denies O2 usage.  At discharge, patient will drive himself home.  His vehicle is currently parked at [facility].  TOC to continue to follow for any discharge needs that may arise.         Patient Goals and CMS Choice            Expected Discharge Plan and Services                                              Prior Living Arrangements/Services                       Activities of Daily Living   ADL Screening (condition at time of admission) Independently performs ADLs?: Yes (appropriate for developmental age) Is the patient deaf or have difficulty hearing?: No Does the patient have difficulty seeing, even when wearing glasses/contacts?: No Does the patient have difficulty concentrating, remembering, or making decisions?: No  Permission Sought/Granted                  Emotional Assessment              Admission diagnosis:  Shortness of breath [R06.02] Atrial fibrillation with RVR (HCC) [I48.91] Chest pain, unspecified type [R07.9] Patient Active Problem List   Diagnosis Date Noted   Atrial fibrillation with RVR (HCC) 11/03/2023   COPD exacerbation (HCC) 10/22/2023   Cough 10/22/2023   Degenerative tear of glenoid labrum of left shoulder 09/16/2023   Long-term use of aspirin therapy    Cough  with sputum 07/31/2023   Nontraumatic incomplete tear of left rotator cuff 07/25/2023   Class 1 obesity with alveolar hypoventilation, serious comorbidity, and body mass index (BMI) of 34.0 to 34.9 in adult (HCC) 07/11/2023   Erythrocytosis 05/26/2023   CPAP (continuous positive airway pressure) dependence 03/08/2023   Chronic 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 02/07/2023   Psoriatic arthritis of multiple joints (HCC) 09/17/2022   Polyp of descending colon 08/01/2022   ICD (implantable cardioverter-defibrillator) in place 05/31/2022   DOE (dyspnea on exertion) 05/31/2022   Chronic obstructive pulmonary disease (COPD) (HCC) 05/14/2022   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, with long-term current use of insulin (HCC) 02/08/2022   Depression, recurrent (HCC) 02/08/2022   Annual physical exam 11/05/2021   Elevated triglycerides with high cholesterol 09/05/2021   Hyperlipidemia associated with type 2 diabetes mellitus (HCC) 09/05/2021   Long term current use of anticoagulant 05/20/2021   Mitral regurgitation 05/20/2021   Mediastinal lymphadenopathy 05/20/2021   Atherosclerotic heart disease of native coronary artery with other forms of angina pectoris (  HCC)    OSA on CPAP 11/23/2020   Type II diabetes mellitus with renal manifestations (HCC) 06/11/2020   History of pulmonary embolism    Type 2 diabetes mellitus with hyperlipidemia (HCC) 06/26/2019   Coronary artery disease, non-occlusive 03/15/2016   Chronic systolic CHF (congestive heart failure) (HCC) 01/23/2016   NICM (nonischemic cardiomyopathy) (HCC) 01/17/2016   NSVT (nonsustained ventricular tachycardia) (HCC)    Chest pain 03/20/2015   PCP:  Sallee Provencal, FNP Pharmacy:   [facility] [address] ([address]), [place] - [address] [place]) [place] [address] Phone: [phone] Fax: [phone]  [facility] - [place], [place] - [address] [place] [address] Phone: [phone] Fax: [phone]  [facility] - [address] [place], [place] - [address] [place] [address] Phone: [phone] Fax: [phone]  [facility] [address] [place] [address] Phone: [phone] Fax: [phone]     Social Drivers of Health (SDOH) Social History: SDOH Screenings   Food Insecurity: No Food Insecurity (11/03/2023)  Housing: Low Risk  (11/03/2023)  Transportation Needs: No Transportation Needs (11/03/2023)  Utilities: Not At Risk (11/03/2023)  Alcohol Screen: Low Risk  (09/17/2022)  Depression (PHQ2-9): High Risk (04/16/2023)  Financial Resource Strain: Medium Risk (08/05/2022)  Physical Activity: Insufficiently Active (01/20/2018)  Social Connections: Unknown (10/22/2023)  Stress: Stress Concern Present (09/02/2017)  Tobacco Use: Medium Risk (11/03/2023)   SDOH Interventions:     Readmission Risk Interventions    11/04/2023    8:19 PM 08/13/2021    9:45 AM 05/22/2021    4:19 PM  Readmission Risk Prevention Plan  Transportation Screening Complete Complete Complete  PCP or Specialist Appt within 3-5 Days  Complete   Social Work Consult for Recovery Care Planning/Counseling  Complete Complete  Palliative Care Screening  Not Applicable Not Applicable  Medication Review Oceanographer) Complete Complete Complete  PCP or Specialist appointment within 3-5 days of discharge Complete    HRI or Home Care Consult Complete    SW Recovery Care/Counseling Consult Complete    Palliative Care Screening Not Applicable    Skilled Nursing Facility Not Applicable

## 2023-11-04 NOTE — Consult Note (Signed)
    Advanced Heart Failure Team Consult Note   Primary Physician: Sallee Provencal, FNP PCP-Cardiologist:  Julien Nordmann, MD  Reason for Consultation: Afib  HPI:    Gabriel [name]. is a 53 y.o. male with a PMH of CAD, DM, HTN, HFrEF, PE on xarelto, VT s/p IC who is seen today for evaluation of new atrial fibrillation at the request of Dr. Sherryll Burger.    Patient presented to previous heart failure clinic yesterday with chief complaint of 2 to 3 weeks of worsening dyspnea on exertion, shortness of breath, appetite, congestion and overwhelming feeling of fatigue.  He denies any palpitations, chest pain.  He does have some feelings of dizziness that have worsened since he was given diltiazem.  He has not significantly swelling, but has noticed some swelling around his abdomen.     Objective:    Vital Signs:   Temp:  [97.7 F (36.5 C)-98.8 F (37.1 C)] 98.8 F (37.1 C) (03/18 1400) Pulse Rate:  [90-115] 90 (03/18 1719) Resp:  [15-24] 22 (03/18 1400) BP: (108-141)/(51-84) 129/66 (03/18 1719) SpO2:  [93 %-100 %] 98 % (03/18 1719)    Weight change: Filed Weights   11/03/23 1520  Weight: 104.3 kg    Intake/Output:   Intake/Output Summary (Last 24 hours) at 11/04/2023 1835 Last data filed at 11/04/2023 1400 Gross per 24 hour  Intake --  Output 2600 ml  Net -2600 ml      Physical Exam    General:  Well appearing. No resp difficulty HEENT: NCAT Cor:  tachycardic, irregular rate & rhythm. No rubs, gallops or murmurs. JVP moderately elevated. No LE edema Lungs: Normal work of breathing, clear to auscultation bilaterally Abdomen: mildly distended,  Extremities: no cyanosis, clubbing, rash Neuro: alert & orientedx3, cranial nerves grossly intact. moves all 4 extremities w/o difficulty. Affect pleasant Vascular: 2+ radial pulses   Telemetry   Afib in the 110-120s  Labs and medications reviewed in Epic.  Patient Profile   Patient is a 53 year old male with past  medical history of CAD, chronic systolic heart failure who presents with new atrial fibrillation with RVR.  Assessment/Plan   Acute on chronic systolic heart failure: Appears moderately volume overloaded, lethargic, likely in the setting of recent IV diltiazem use.  Will avoid further given the known heart failure.   -Stop fluids and start IV diuresis, Lasix 80 mg x 1 -Appear to have been on Farxiga as an outpatient, but hesitant to start.  Will discuss further tomorrow -Restart spironolactone 25 mg -Management of afib as below - Hopeful restart entresto tomorrow  Atrial fibrillation with RVR: Avoid further nodal blocking agents, BP preserved and rates relatively well controlled.  -Start IV amiodarone. -Continue xarelto, has not missed any doses -Tentative plan for DCCV on Thursday after diuresis  CKD: Creatinine 1.2, stable. - Conitnue diuresis as above - Restart SGLT2  CAD: Known from previous cath, poor revascularization targets. - Continue xarelto, fenofibrate, crestor  HTN: Management as above  OSA: Compliant with CPAP  Length of Stay: 1  Romie Minus, MD  11/04/2023, 6:35 PM  Advanced Heart Failure Team Pager [phone] (M-F; 7a - 5p)  Please contact [facility] for night-coverage after hours (4p -7a ) and weekends on [email or web address]

## 2023-11-04 NOTE — Consult Note (Signed)
 ELECTROPHYSIOLOGY CONSULT NOTE    Patient ID: Gabriel [name]. MRN: [id number], DOB/AGE: February 10, 1971 53 y.o.  Admit date: 11/03/2023 Date of Consult: 11/04/2023  Primary Physician: Sallee Provencal, FNP Primary Cardiologist: Gabriel Nordmann, MD  Electrophysiologist: Dr. Lalla Brothers   Referring Provider: Dr. Okey Dupre   Patient Profile: Gabriel [name]. is a 53 y.o. 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 Okey Dupre.  HPI:  Gabriel [name]. is a 53 y.o. male with PMH as above. He has been having increased palpitation episodes over the past few weeks. I saw him in clinic 2/5, ordered ambulatory monitor at that time that showed SVT and VT episodes. Review of VT episodes were concerning for both VT and SVT.  He was seen in HF clinic yesterday by NP [facility], who noted he was in AFib w RVR at 132. He complained of dizziness, SOB, fatigue, chest pain and recommended to proceed to ER for further eval.   Currently, he "feels terrible" with increased SOB and his heart feels like it's racing. He is on 2L [place] with O2 sats in high 90s. Initial EKG with AFib w RVR up to 130s.  Last dose of ozempic was 3/16. No missed doses of xarelto.  He believes he is holding on to extra fluid in abd, says it feels fuller than normal with some bloating.     Labs Potassium3.4* (03/18 5284) Magnesium  1.9 (03/17 1142) Creatinine, ser  1.20 (03/18 0605) PLT  281 (03/18 0605) HGB  12.9* (03/18 0605) WBC 8.2 (03/18 0605) Troponin I (High Sensitivity)24* (03/17 1326).    Past Medical History:  Diagnosis Date   Aortic root dilation (HCC) 11/28/2016   a.) TTE 11/28/2016: 43 mm; b.) TTE 11/23/2019: 38 mm; c.) TTE 08/14/2022: 40 mm; d.) TTE 03/08/2023: 43 mm   Arteriosclerosis of right carotid artery    a.) carotid doppler 04/29/2017: <50% RICA   Bronchitis 07/2023   CAD (coronary  artery disease)    a.) MV 04/2015: low risk; b.) LHC 01/06/2017: minor irregs in LAD/Diag/LCX/OM, RCA 40p/m/d; c.) R/LHC 06/13/2020: 50% dLAD, 30% p-mLCx, 40% OM1, 100% pRCA (L-R collats) - med mgmt; d.) R/LHC 08/19/2023: 50% dLAD, 30% p-mLCx, 40% OM1, 100% pRCA (L-R collats), 50% p-mRCA - med mgmt   Chest wall pain, chronic    Chronic 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    Chronic Troponin Elevation    CKD (chronic kidney disease), stage II-III    COPD (chronic obstructive pulmonary disease) (HCC)    Depression    DM (diabetes mellitus), type 2 (HCC)    HFrEF (heart failure with reduced ejection fraction) (HCC)    a.) EF 03/20/2015: EF 45-50%; b.) MV 05/19/2015: EF 30-44%; c.) TTE 01/04/2016: EF 20-25%; d.) TTE 02/29/2016: EF 30-35%; e.) TTE 11/28/2016: EF 40-45%; f.) LHC 01/06/2017: EF 25-35%; g.) TTE 04/29/2017: EF 45-50%; h.) TTE 06/26/2019: EF 30-35%; i.) TTE 11/23/2019: EF 25-30%; j.) TTE 06/08/2020: EF 35-40%; k.) TTE 05/21/2021: EF 40-45%; l.) TTE 08/14/2022: EF 40-45%; m.) TTE 03/08/2023: EF 40-45%   History of 2019 novel coronavirus disease (COVID-19) 03/01/2022   History of methicillin resistant staphylococcus aureus (MRSA)    Hyperlipidemia    Hypertension    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 12/2015 noted on tele-->amio;  b. 12/2015 Event monitor: no VT noted.   Obesity (BMI 30.0-34.9)    On rivaroxaban therapy    OSA treated with BiPAP    Polyp of descending colon    Psoriasis    Psoriatic arthritis (HCC)    a.) Tx'd with ixekizumab   Pulmonary HTN (HCC) 02/29/2016   a.) TTE 02/29/2016: RVSP 47; b.) TTE 06/26/2019: RVSP 58.4; c.) TTE 12/08/2019: RVSP 40.8; d.) TTE 06/08/2020: RVSP 44.7; e.) R/LHC 06/08/2020: mRA 9, mPA 40, mPWCP 17, AO sat 98, PA sat 61, CO 3.1, CI 1.3, PVR 7.4, LVEDP 20; f.) R/LHC 08/18/2020: mRA 5, mPA 41, mPCWP 18, AO sat 92, PA sat 59, CO 4.3, CI 1.9, PVR 5.3   Recurrent pulmonary emboli (HCC) 06/07/2020   06/07/20: small bilateral PEs.  12/31/19: RUL and RLL PEs.   Stroke [facility])    pt states per mri in 2023-unsure of when stroke happened   Syncope    a. 01/2016 - felt to be vasovagal.   Thrombocytopenia (HCC)      Surgical History:  Past Surgical History:  Procedure Laterality Date   COLONOSCOPY WITH PROPOFOL N/A 08/01/2022   Procedure: COLONOSCOPY WITH PROPOFOL;  Surgeon: Toney Reil, MD;  Location: [facility] ENDOSCOPY;  Service: Gastroenterology;  Laterality: N/A;   ESOPHAGOGASTRODUODENOSCOPY (EGD) WITH PROPOFOL N/A 08/01/2022   Procedure: ESOPHAGOGASTRODUODENOSCOPY (EGD) WITH PROPOFOL;  Surgeon: Toney Reil, MD;  Location: [facility] ENDOSCOPY;  Service: Gastroenterology;  Laterality: N/A;   ESOPHAGOGASTRODUODENOSCOPY (EGD) WITH PROPOFOL N/A 10/16/2022   Procedure: ESOPHAGOGASTRODUODENOSCOPY (EGD) WITH PROPOFOL;  Surgeon: Toney Reil, MD;  Location: [facility] ENDOSCOPY;  Service: Gastroenterology;  Laterality: N/A;   FINGER AMPUTATION Left    Traumatic   LEFT HEART CATH AND CORONARY ANGIOGRAPHY N/A 01/06/2017   Procedure: Left Heart Cath and Coronary Angiography;  Surgeon: Iran Ouch, MD;  Location: [facility] INVASIVE CV LAB;  Service: Cardiovascular;  Laterality: N/A;   RIGHT/LEFT HEART CATH AND CORONARY  ANGIOGRAPHY N/A 06/13/2020   Procedure: RIGHT/LEFT HEART CATH AND CORONARY ANGIOGRAPHY;  Surgeon: Yvonne Kendall, MD;  Location: [facility] INVASIVE CV LAB;  Service: Cardiovascular;  Laterality: N/A;   RIGHT/LEFT HEART CATH AND CORONARY ANGIOGRAPHY Bilateral 08/19/2023   Procedure: RIGHT/LEFT HEART CATH AND CORONARY ANGIOGRAPHY;  Surgeon: Yvonne Kendall, MD;  Location: [facility] INVASIVE CV LAB;  Service: Cardiovascular;  Laterality: Bilateral;   SHOULDER ARTHROSCOPY WITH SUBACROMIAL DECOMPRESSION, ROTATOR CUFF REPAIR AND BICEP TENDON REPAIR Left 09/16/2023   Procedure: SHOULDER ARTHROSCOPY WITH DEBRIDEMENT, DECOMPRESSION, ROTATOR CUFF REPAIR AND BICEPS TENODESIS.;  Surgeon: Christena Flake, MD;  Location: [facility] ORS;  Service: Orthopedics;  Laterality: Left;   SUBQ ICD IMPLANT N/A 05/20/2022   Procedure: SUBQ ICD IMPLANT;  Surgeon: Lanier Prude, MD;  Location: [facility] INVASIVE CV LAB;  Service: Cardiovascular;  Laterality: N/A;     (Not in a hospital admission)   Inpatient  Medications:   allopurinol  300 mg Oral BH-q7a   aspirin EC  81 mg Oral BH-q7a   buPROPion  150 mg Oral Daily   fenofibrate  160 mg Oral QHS   FLUoxetine  20 mg Oral QPM   insulin aspart  0-15 Units Subcutaneous TID WC   insulin aspart  0-5 Units Subcutaneous QHS   insulin glargine  36 Units Subcutaneous QHS   pantoprazole  80 mg Oral q morning   Resmetirom  100 mg Oral Q1200   rivaroxaban  20 mg Oral Q supper   rosuvastatin  20 mg Oral QHS   umeclidinium-vilanterol  1 puff Inhalation BH-q7a    Allergies:  Allergies  Allergen Reactions   Metformin And Related Nausea And Vomiting   Zantac [Ranitidine Hcl] Diarrhea and Nausea Only    Night sweats    Family History  Problem Relation Age of Onset   Diabetes Mother    Diabetes Mellitus II Mother    Hypothyroidism Mother    Hypertension Mother    Kidney failure Mother        Dialysis   Heart attack Mother        55 yo approximately   Hypertension Father    Gout  Father    Cancer Maternal Grandfather    Diabetes Maternal Grandfather    Cancer Paternal Aunt      Physical Exam: Vitals:   11/04/23 0400 11/04/23 0600 11/04/23 0605 11/04/23 0630  BP: (!) 141/73 127/81  116/84  Pulse: (!) 110 (!) 115  (!) 102  Resp: 15 17  18   Temp:   98.5 F (36.9 C)   TempSrc:   Oral   SpO2: 96% 95%  96%  Weight:      Height:        GEN- ill-appearing, A&O x 3, normal affect HEENT: Normocephalic, atraumatic Lungs- CTAB, Normal effort.  Heart- irregular, tachycardic rate and rhythm, No M/G/R. JVD at mid-neck GI- Soft, NT, ND.  Extremities- No clubbing, cyanosis, or edema   Radiology/Studies: CT Angio Chest PE W/Cm &/Or Wo Cm Result Date: 11/03/2023 CLINICAL DATA:  Atrial fibrillation, tachycardia, short of breath, hypotension, chest pain EXAM: CT ANGIOGRAPHY CHEST WITH CONTRAST TECHNIQUE: Multidetector CT imaging of the chest was performed using the standard protocol during bolus administration of intravenous contrast. Multiplanar CT image reconstructions and MIPs were obtained to evaluate the vascular anatomy. RADIATION DOSE REDUCTION: This exam was performed according to the departmental dose-optimization program which includes automated exposure control, adjustment of the mA and/or kV according to patient size and/or use of iterative reconstruction technique. CONTRAST:  75mL OMNIPAQUE IOHEXOL 350 MG/ML SOLN COMPARISON:  07/22/2023 FINDINGS: Cardiovascular: This is a technically adequate evaluation of the pulmonary vasculature. No filling defects or pulmonary emboli. Mild cardiomegaly without pericardial effusion. No evidence of thoracic aortic aneurysm or dissection. Atherosclerosis of the aorta and coronary vasculature. Mediastinum/Nodes: No enlarged mediastinal, hilar, or axillary lymph nodes. Thyroid gland, trachea, and esophagus demonstrate no significant findings. Lungs/Pleura: No acute airspace disease, effusion, or pneumothorax. Central airways are patent.  There is a 5 mm left upper lobe pulmonary nodule reference image 60/6, stable. Upper Abdomen: No acute abnormality. Musculoskeletal: Defibrillator is seen within the left lateral chest wall, lead within the anterior chest wall along the left margin of the sternum. There is a significant left shoulder effusion, with bony destruction along the superolateral margin of the humeral head. Please correlate with any prior history of shoulder trauma as this could reflect healing fracture and posttraumatic  effusion. Otherwise, underlying septic arthritis and osteomyelitis could be considered. No other acute bony abnormalities. The reconstructed images demonstrate no additional findings. Review of the MIP images confirms the above findings. IMPRESSION: 1. No evidence of pulmonary embolus. 2. Significant left shoulder effusion with bony destruction along the superolateral margin of the left humeral head. Please correlate with any history of recent trauma, otherwise septic arthritis and osteomyelitis should be considered. 3. Cardiomegaly without pericardial effusion. 4. Aortic Atherosclerosis (ICD10-I70.0). Coronary artery atherosclerosis. Electronically Signed   By: Sharlet Salina M.D.   On: 11/03/2023 15:53   DG Chest 2 View Result Date: 11/03/2023 CLINICAL DATA:  Chest pain EXAM: CHEST - 2 VIEW COMPARISON:  10/22/2023 FINDINGS: External defibrillator lead overlies the anterior left chest. Stable heart size. Mildly prominent bibasilar interstitial markings. Lungs appear otherwise clear. No pleural effusion or pneumothorax. IMPRESSION: Mildly prominent bibasilar interstitial markings, which may reflect bronchitic type lung changes. Electronically Signed   By: Duanne Guess D.O.   On: 11/03/2023 11:46   DG Chest 2 View Result Date: 10/22/2023 CLINICAL DATA:  Cough, shortness of breath. EXAM: CHEST - 2 VIEW COMPARISON:  July 22, 2023. FINDINGS: Stable cardiomediastinal silhouette. Both lungs are clear. The visualized  skeletal structures are unremarkable. IMPRESSION: No active cardiopulmonary disease. Electronically Signed   By: Lupita Raider M.D.   On: 10/22/2023 14:12   LONG TERM MONITOR (3-14 DAYS) Result Date: 10/21/2023 HR 56 to 214, average 89. 988 VT episodes, longest 55.1 seconds with an average rate of 100 bpm. 46 SVT episodes, longest 10.7 seconds with an average rate of 179 bpm. Occasional supraventricular ectopy, 1.3% Occasional ventricular ectopy, 2.5% Sheria Lang T. Lalla Brothers, MD, [facility] Cardiac Electrophysiology  TTE, 03/08/23 LVEF  45-50%, LV mildly decreased function, global hypokinesis   EKG:  11/03/2023 - AFib w RVR, rate 148; QT 308 / 483 10/22/2023 EKG - SR at 98bpm; QT 356 / QTC 455 08/08/2023 EKG - SR at 85bpm; QT 426 / QTC 506  (personally reviewed)  TELEMETRY: AFib w rates 90-130s (personally reviewed)  DEVICE HISTORY: [employer] S-ICD, imp 05/2022; dx ICM 84% battery life No VT/VF episodes 10% AF burden   Assessment/Plan: #) AFib w RVR Relatively new diagnosis On xarelto for h/o PE no missed doses AAD options quite limited d/t history of CAD and prolonged QTC  Will start IV amiodarone bolus + gtt Recent LFTs and TSH stable Keep K > 4, Mag > 2, Will replete K Consider DCCV if does not convert with AAD, last ozempic dose 3/16 Consider AF ablation in future   #) Osa on CPAP Continue nightly use while inpatient  #) HFmrEF Does not appear fluid overloaded on exam Query whether abd bloating, decreased appetite related to arrhythmia and not CHF exacerbation HF team is on board          For questions or updates, please contact [facility] Please consult [email or web address] for contact info under Cardiology/STEMI.  Signed, Sherie Don, NP  11/04/2023 9:15 AM

## 2023-11-04 NOTE — Progress Notes (Addendum)
 1      PROGRESS NOTE    Gabriel [name].  UJW:[id number] DOB: September 23, 1970 DOA: 11/03/2023 PCP: Sallee Provencal, FNP    Brief Narrative:   53 year old male with history of CAD, non-insulin-dependent diabetes mellitus, hypertension, history of PE on Eliquis, history of nonsustained V. tach status post AICD, who presents emergency department for chief concerns of dizziness and atrial fibrillation   3/18: Advanced CHF team consult/evaluation   Assessment & Plan:   Principal Problem:   Atrial fibrillation with RVR (HCC) Active Problems:   Chronic obstructive pulmonary disease (COPD) (HCC)   NICM (nonischemic cardiomyopathy) (HCC)   Type 2 diabetes mellitus with hyperlipidemia (HCC)   Type II diabetes mellitus with renal manifestations (HCC)   OSA on CPAP   Depression, recurrent (HCC)   * Atrial fibrillation with RVR (HCC) Acute on chronic systolic heart failure:  -Normal TSH and pending echo this admission - Farxiga on hold Currently no symptoms reported by patient concerning for infectious etiology Chest x-ray suggestive of bronchitis changes.  No pneumonia Advanced heart failure team c/s Avoid Cardizem and any fluids.  Status post IV Lasix and starting amiodarone per Dr. Elwyn Lade Tentative plan for cardioversion on Thursday  Depression, recurrent (HCC) Home fluoxetine 20 mg every evening resumed Bupropion 150 mg daily resumed   Type II diabetes mellitus with renal manifestations (HCC) Home long-acting insulin 36 units nightly resumed Insulin SSI with at bedtime coverage ordered Home Ozempic will not be resumed on admission   Type 2 diabetes mellitus with hyperlipidemia (HCC) Home fenofibrate equivalent resumed on admission Rosuvastatin 20 mg nightly resumed  History of pulmonary embolism Continue Xarelto   OSA on CPAP CPAP nightly    DVT prophylaxis: Xarelto Place TED hose Start: 11/03/23 1426 rivaroxaban (XARELTO) tablet 20 mg     Code Status: Full  code Family Communication: (discussed with patient Disposition Plan: Possible discharge in 3 to 4 days depending on clinical condition and cardiac evaluation   Consultants:  Cardiology  Procedures:  Cardioversion planned for Thursday 3/20    Subjective:  Feeling short of breath and dizzy, tired  Objective: Vitals:   11/04/23 0600 11/04/23 0605 11/04/23 0630 11/04/23 1017  BP: 127/81  116/84 125/75  Pulse: (!) 115  (!) 102 99  Resp: 17  18 19   Temp:  98.5 F (36.9 C)  98.6 F (37 C)  TempSrc:  Oral  Oral  SpO2: 95%  96% 99%  Weight:      Height:        Intake/Output Summary (Last 24 hours) at 11/04/2023 1301 Last data filed at 11/04/2023 0227 Gross per 24 hour  Intake --  Output 800 ml  Net -800 ml   Filed Weights   11/03/23 1520  Weight: 104.3 kg    Examination:  General exam: Appears calm and comfortable  Respiratory system: Clear to auscultation. Respiratory effort normal. Cardiovascular system: Irregularly irregular heart rate, tachycardic.  JVD + Gastrointestinal system: Abdomen is soft, benign Central nervous system: Alert and oriented. No focal neurological deficits. Extremities: Symmetric 5 x 5 power. Skin: No rashes, lesions or ulcers Psychiatry: Judgement and insight appear normal. Mood & affect appropriate.     Data Reviewed: I have personally reviewed following labs and imaging studies  CBC: Recent Labs  Lab 11/03/23 1027 11/04/23 0605  WBC 11.0* 8.2  HGB 16.0 12.9*  HCT 50.1 39.9  MCV 80.5 79.6*  PLT 415* 281   Basic Metabolic Panel: Recent Labs  Lab 11/03/23 1142 11/04/23  0605  NA 133* 133*  K 4.0 3.4*  CL 102 103  CO2 19* 22  GLUCOSE 128* 104*  BUN 30* 23*  CREATININE 1.20 1.20  CALCIUM 8.7* 8.3*  MG 1.9  --   PHOS 3.9  --     Coagulation Profile: Recent Labs  Lab 11/03/23 1027  INR 1.9*    CBG: Recent Labs  Lab 11/03/23 1609 11/03/23 2217 11/03/23 2342 11/04/23 0830  GLUCAP 139* 90 121* 144*   Lipid  Profile: No results for input(s): "CHOL", "HDL", "LDLCALC", "TRIG", "CHOLHDL", "LDLDIRECT" in the last 72 hours. Thyroid Function Tests: Recent Labs    11/03/23 1607  TSH 0.649   Anemia Panel: No results for input(s): "VITAMINB12", "FOLATE", "FERRITIN", "TIBC", "IRON", "RETICCTPCT" in the last 72 hours. Sepsis Labs: No results for input(s): "PROCALCITON", "LATICACIDVEN" in the last 168 hours.  Recent Results (from the past 240 hours)  Resp panel by RT-PCR (RSV, Flu A&B, Covid) Anterior Nasal Swab     Status: None   Collection Time: 11/03/23 10:49 AM   Specimen: Anterior Nasal Swab  Result Value Ref Range Status   SARS Coronavirus 2 by RT PCR NEGATIVE NEGATIVE Final    Comment: (NOTE) SARS-CoV-2 target nucleic acids are NOT DETECTED.  The SARS-CoV-2 RNA is generally detectable in upper respiratory specimens during the acute phase of infection. The lowest concentration of SARS-CoV-2 viral copies this assay can detect is 138 copies/mL. A negative result does not preclude SARS-Cov-2 infection and should not be used as the sole basis for treatment or other patient management decisions. A negative result may occur with  improper specimen collection/handling, submission of specimen other than nasopharyngeal swab, presence of viral mutation(s) within the areas targeted by this assay, and inadequate number of viral copies(<138 copies/mL). A negative result must be combined with clinical observations, patient history, and epidemiological information. The expected result is Negative.  Fact Sheet for Patients:  BloggerCourse.com  Fact Sheet for Healthcare Providers:  SeriousBroker.it  This test is no t yet approved or cleared by the [place] FDA and  has been authorized for detection and/or diagnosis of SARS-CoV-2 by FDA under an Emergency Use Authorization (EUA). This EUA will remain  in effect (meaning this test can be used) for  the duration of the COVID-19 declaration under Section 564(b)(1) of the Act, 21 U.S.C.section 360bbb-3(b)(1), unless the authorization is terminated  or revoked sooner.       Influenza A by PCR NEGATIVE NEGATIVE Final   Influenza B by PCR NEGATIVE NEGATIVE Final    Comment: (NOTE) The Xpert Xpress SARS-CoV-2/FLU/RSV plus assay is intended as an aid in the diagnosis of influenza from Nasopharyngeal swab specimens and should not be used as a sole basis for treatment. Nasal washings and aspirates are unacceptable for Xpert Xpress SARS-CoV-2/FLU/RSV testing.  Fact Sheet for Patients: BloggerCourse.com  Fact Sheet for Healthcare Providers: SeriousBroker.it  This test is not yet approved or cleared by the [place] FDA and has been authorized for detection and/or diagnosis of SARS-CoV-2 by FDA under an Emergency Use Authorization (EUA). This EUA will remain in effect (meaning this test can be used) for the duration of the COVID-19 declaration under Section 564(b)(1) of the Act, 21 U.S.C. section 360bbb-3(b)(1), unless the authorization is terminated or revoked.     Resp Syncytial Virus by PCR NEGATIVE NEGATIVE Final    Comment: (NOTE) Fact Sheet for Patients: BloggerCourse.com  Fact Sheet for Healthcare Providers: SeriousBroker.it  This test is not yet approved or cleared by  the [employer] and has been authorized for detection and/or diagnosis of SARS-CoV-2 by FDA under an Emergency Use Authorization (EUA). This EUA will remain in effect (meaning this test can be used) for the duration of the COVID-19 declaration under Section 564(b)(1) of the Act, 21 U.S.C. section 360bbb-3(b)(1), unless the authorization is terminated or revoked.  Performed at [facility], [address], [place], [place] [address]          Radiology Studies: ECHOCARDIOGRAM  COMPLETE Result Date: 11/04/2023    ECHOCARDIOGRAM REPORT   Patient Name:   Gabriel [name]. Date of Exam: 11/04/2023 Medical Rec #:  [id number]           Height:       71.0 in Accession #:    [id number]          Weight:       229.9 lb Date of Birth:  Dec 28, 1970           BSA:          2.237 m Patient Age:    52 years            BP:           116/84 mmHg Patient Gender: M                   HR:           119 bpm. Exam Location:  [facility] Procedure: 2D Echo, Cardiac Doppler, Color Doppler and Intracardiac            Opacification Agent (Both Spectral and Color Flow Doppler were            utilized during procedure). Indications:     Atrial Fibnllation  History:         Patient has prior history of Echocardiogram examinations, most                  recent 03/10/2023. CHF and Cardiomyopathy, Defibrillator, COPD,                  Arrythmias:Atrial Fibrillation, Signs/Symptoms:Chest Pain and                  Dyspnea; Risk Factors:Dyslipidemia, Diabetes and Sleep Apnea.  Sonographer:     Mikki Harbor Referring Phys:  [id number] AMY N COX Diagnosing Phys: Yvonne Kendall MD  Sonographer Comments: Technically difficult study due to poor echo windows, suboptimal subcostal window and patient is obese. Image acquisition challenging due to COPD. IMPRESSIONS  1. Left ventricular ejection fraction, by estimation, is 35 to 40%. The left ventricle has moderately decreased function. The left ventricle demonstrates global hypokinesis. The left ventricular internal cavity size was severely dilated. There is mild left ventricular hypertrophy. Left ventricular diastolic function could not be evaluated.  2. Right ventricular systolic function is mildly reduced. The right ventricular size is mildly enlarged.  3. Left atrial size was moderately dilated.  4. Right atrial size was mildly dilated.  5. A small pericardial effusion is present. The pericardial effusion is posterior to the left ventricle.  6. The mitral valve is degenerative. Mild  mitral valve regurgitation.  7. The aortic valve is tricuspid. There is mild calcification of the aortic valve. There is mild thickening of the aortic valve. Aortic valve regurgitation is at least moderate but may be underestimated by jet eccentricity. Aortic valve sclerosis/calcification is present, without any evidence of aortic stenosis.  8. Aortic dilatation noted. There is mild dilatation  of the aortic root, measuring 43 mm. Conclusion(s)/Recommendation(s): No left ventricular mural or apical thrombus/thrombi. FINDINGS  Left Ventricle: Left ventricular ejection fraction, by estimation, is 35 to 40%. The left ventricle has moderately decreased function. The left ventricle demonstrates global hypokinesis. Definity contrast agent was given IV to delineate the left ventricular endocardial borders. The left ventricular internal cavity size was severely dilated. There is mild left ventricular hypertrophy. Left ventricular diastolic function could not be evaluated due to atrial fibrillation. Left ventricular diastolic  function could not be evaluated. Right Ventricle: The right ventricular size is mildly enlarged. No increase in right ventricular wall thickness. Right ventricular systolic function is mildly reduced. Left Atrium: Left atrial size was moderately dilated. Right Atrium: Right atrial size was mildly dilated. Pericardium: A small pericardial effusion is present. The pericardial effusion is posterior to the left ventricle. Presence of epicardial fat layer. Mitral Valve: The mitral valve is degenerative in appearance. Mild mitral valve regurgitation. MV peak gradient, 4.8 mmHg. The mean mitral valve gradient is 2.0 mmHg. Tricuspid Valve: The tricuspid valve is grossly normal. Tricuspid valve regurgitation is not demonstrated. Aortic Valve: The aortic valve is tricuspid. There is mild calcification of the aortic valve. There is mild thickening of the aortic valve. Aortic valve regurgitation is at least  moderate but may be underestimated by jet eccentricity. Aortic valve sclerosis/calcification is present, without any evidence of aortic stenosis. Aortic valve mean gradient measures 3.5 mmHg. Aortic valve peak gradient measures 5.3 mmHg. Aortic valve area, by VTI measures 3.18 cm. Pulmonic Valve: The pulmonic valve was not well visualized. Pulmonic valve regurgitation is not visualized. No evidence of pulmonic stenosis. Aorta: Aortic dilatation noted. There is mild dilatation of the aortic root, measuring 43 mm. Venous: The inferior vena cava was not well visualized. IAS/Shunts: The interatrial septum was not well visualized.  LEFT VENTRICLE PLAX 2D LVIDd:         6.50 cm LVIDs:         5.40 cm LV PW:         1.20 cm LV IVS:        1.30 cm LVOT diam:     2.30 cm LV SV:         69 LV SV Index:   31 LVOT Area:     4.15 cm  RIGHT VENTRICLE RV Basal diam:  4.15 cm RV Mid diam:    3.90 cm RV S prime:     9.25 cm/s LEFT ATRIUM            Index        RIGHT ATRIUM           Index LA diam:      4.60 cm  2.06 cm/m   RA Area:     21.20 cm LA Vol (A2C): 92.2 ml  41.21 ml/m  RA Volume:   67.20 ml  30.04 ml/m LA Vol (A4C): 130.0 ml 58.10 ml/m  AORTIC VALVE AV Area (Vmax):    3.22 cm AV Area (Vmean):   2.91 cm AV Area (VTI):     3.18 cm AV Vmax:           115.50 cm/s AV Vmean:          87.500 cm/s AV VTI:            0.218 m AV Peak Grad:      5.3 mmHg AV Mean Grad:      3.5 mmHg LVOT Vmax:  89.60 cm/s LVOT Vmean:        61.300 cm/s LVOT VTI:          0.167 m LVOT/AV VTI ratio: 0.77  AORTA Ao Root diam: 4.30 cm Ao Asc diam:  3.20 cm MITRAL VALVE MV Area (PHT): 8.16 cm    SHUNTS MV Area VTI:   3.69 cm    Systemic VTI:  0.17 m MV Peak grad:  4.8 mmHg    Systemic Diam: 2.30 cm MV Mean grad:  2.0 mmHg MV Vmax:       1.10 m/s MV Vmean:      64.1 cm/s MV Decel Time: 93 msec MV E velocity: 84.40 cm/s Yvonne Kendall MD Electronically signed by Yvonne Kendall MD Signature Date/Time: 11/04/2023/11:53:09 AM    Final     CT Angio Chest PE W/Cm &/Or Wo Cm Result Date: 11/03/2023 CLINICAL DATA:  Atrial fibrillation, tachycardia, short of breath, hypotension, chest pain EXAM: CT ANGIOGRAPHY CHEST WITH CONTRAST TECHNIQUE: Multidetector CT imaging of the chest was performed using the standard protocol during bolus administration of intravenous contrast. Multiplanar CT image reconstructions and MIPs were obtained to evaluate the vascular anatomy. RADIATION DOSE REDUCTION: This exam was performed according to the departmental dose-optimization program which includes automated exposure control, adjustment of the mA and/or kV according to patient size and/or use of iterative reconstruction technique. CONTRAST:  75mL OMNIPAQUE IOHEXOL 350 MG/ML SOLN COMPARISON:  07/22/2023 FINDINGS: Cardiovascular: This is a technically adequate evaluation of the pulmonary vasculature. No filling defects or pulmonary emboli. Mild cardiomegaly without pericardial effusion. No evidence of thoracic aortic aneurysm or dissection. Atherosclerosis of the aorta and coronary vasculature. Mediastinum/Nodes: No enlarged mediastinal, hilar, or axillary lymph nodes. Thyroid gland, trachea, and esophagus demonstrate no significant findings. Lungs/Pleura: No acute airspace disease, effusion, or pneumothorax. Central airways are patent. There is a 5 mm left upper lobe pulmonary nodule reference image 60/6, stable. Upper Abdomen: No acute abnormality. Musculoskeletal: Defibrillator is seen within the left lateral chest wall, lead within the anterior chest wall along the left margin of the sternum. There is a significant left shoulder effusion, with bony destruction along the superolateral margin of the humeral head. Please correlate with any prior history of shoulder trauma as this could reflect healing fracture and posttraumatic effusion. Otherwise, underlying septic arthritis and osteomyelitis could be considered. No other acute bony abnormalities. The reconstructed  images demonstrate no additional findings. Review of the MIP images confirms the above findings. IMPRESSION: 1. No evidence of pulmonary embolus. 2. Significant left shoulder effusion with bony destruction along the superolateral margin of the left humeral head. Please correlate with any history of recent trauma, otherwise septic arthritis and osteomyelitis should be considered. 3. Cardiomegaly without pericardial effusion. 4. Aortic Atherosclerosis (ICD10-I70.0). Coronary artery atherosclerosis. Electronically Signed   By: Sharlet Salina M.D.   On: 11/03/2023 15:53   DG Chest 2 View Result Date: 11/03/2023 CLINICAL DATA:  Chest pain EXAM: CHEST - 2 VIEW COMPARISON:  10/22/2023 FINDINGS: External defibrillator lead overlies the anterior left chest. Stable heart size. Mildly prominent bibasilar interstitial markings. Lungs appear otherwise clear. No pleural effusion or pneumothorax. IMPRESSION: Mildly prominent bibasilar interstitial markings, which may reflect bronchitic type lung changes. Electronically Signed   By: Duanne Guess D.O.   On: 11/03/2023 11:46        Scheduled Meds:  allopurinol  300 mg Oral BH-q7a   aspirin EC  81 mg Oral BH-q7a   buPROPion  150 mg Oral Daily   empagliflozin  10  mg Oral Daily   fenofibrate  160 mg Oral QHS   FLUoxetine  20 mg Oral QPM   furosemide  80 mg Intravenous Once   insulin aspart  0-15 Units Subcutaneous TID WC   insulin aspart  0-5 Units Subcutaneous QHS   insulin glargine  36 Units Subcutaneous QHS   pantoprazole  80 mg Oral q morning   potassium chloride  20 mEq Oral Once   Resmetirom  100 mg Oral Q1200   rivaroxaban  20 mg Oral Q supper   rosuvastatin  20 mg Oral QHS   spironolactone  25 mg Oral Daily   umeclidinium-vilanterol  1 puff Inhalation BH-q7a   Continuous Infusions:  amiodarone 60 mg/hr (11/04/23 1225)   Followed by   amiodarone       LOS: 1 day    Time spent: 35 minutes    Dsean Vantol Sherryll Burger, MD [facility] Pager  [phone]  If 7PM-7AM, please contact night-coverage [email or web address]  11/04/2023, 1:01 PM

## 2023-11-04 NOTE — Progress Notes (Signed)
 PHARMACY CONSULT NOTE - ELECTROLYTES  Pharmacy Consult for Electrolyte Monitoring and Replacement   Recent Labs: Height: 5\' 11"  (180.3 cm) Weight: 104.3 kg (229 lb 15 oz) IBW/kg (Calculated) : 75.3 Estimated Creatinine Clearance: 86.5 mL/min (by C-G formula based on SCr of 1.2 mg/dL). Potassium (mmol/L)  Date Value  11/04/2023 3.4 (L)  10/07/2013 3.8   Magnesium (mg/dL)  Date Value  47/42/5956 1.9   Calcium (mg/dL)  Date Value  38/75/6433 8.3 (L)   Calcium, Total (mg/dL)  Date Value  29/51/8841 8.9   Albumin (g/dL)  Date Value  66/01/3015 4.1  08/09/2022 5.0 (H)  11/12/2012 4.2   Phosphorus (mg/dL)  Date Value  08/27/3233 3.9   Sodium (mmol/L)  Date Value  11/04/2023 133 (L)  08/29/2023 139  10/07/2013 139    Assessment  Gabriel [name]. is a 53 y.o. male presenting with dizziness and atrial fibrillation. PMH significant for CAD, non-insulin-dependent diabetes mellitus, hypertension, history of PE on Eliquis, history of nonsustained V. tach status post AICD. Pharmacy has been consulted to monitor and replace electrolytes.  Diet: PO, thin fluids MIVF: N/A Pertinent medications: spirolactone, amiodarone  Goal of Therapy: K>4, Mag>2 given A-fib  Plan:  K 3.4, MD ordered KCl 20 mEq PO x 1 Will order an additional KCl 40 mEq PO x 1 Mag 1.9, give Mag sulfate 2 g IV x 1 Check BMP, Mg, Phos with AM labs  Thank you for allowing pharmacy to be a part of this patient's care.  Merryl Hacker, PharmD Clinical Pharmacist 11/04/2023 1:17 PM

## 2023-11-04 NOTE — Progress Notes (Signed)
*  PRELIMINARY RESULTS* Echocardiogram 2D Echocardiogram has been performed.  Carolyne Fiscal 11/04/2023, 11:04 AM

## 2023-11-04 NOTE — Progress Notes (Signed)
 Heart Failure Navigator Progress Note  Assessed for Heart & Vascular TOC clinic readiness.  Patient does not meet criteria due to current Advanced Heart Failure Team patient.   Navigator will sign off at this time.  Roxy Horseman, RN, BSN [facility] Heart Failure Navigator Secure Chat Only

## 2023-11-05 ENCOUNTER — Ambulatory Visit: Payer: Medicaid Other | Admitting: Cardiology

## 2023-11-05 DIAGNOSIS — I4891 Unspecified atrial fibrillation: Secondary | ICD-10-CM | POA: Diagnosis not present

## 2023-11-05 DIAGNOSIS — I5022 Chronic systolic (congestive) heart failure: Secondary | ICD-10-CM

## 2023-11-05 DIAGNOSIS — I5023 Acute on chronic systolic (congestive) heart failure: Secondary | ICD-10-CM | POA: Diagnosis not present

## 2023-11-05 LAB — GLUCOSE, CAPILLARY
Glucose-Capillary: 102 mg/dL — ABNORMAL HIGH (ref 70–99)
Glucose-Capillary: 107 mg/dL — ABNORMAL HIGH (ref 70–99)
Glucose-Capillary: 110 mg/dL — ABNORMAL HIGH (ref 70–99)
Glucose-Capillary: 131 mg/dL — ABNORMAL HIGH (ref 70–99)

## 2023-11-05 LAB — BASIC METABOLIC PANEL
Anion gap: 10 (ref 5–15)
BUN: 25 mg/dL — ABNORMAL HIGH (ref 6–20)
CO2: 23 mmol/L (ref 22–32)
Calcium: 8.9 mg/dL (ref 8.9–10.3)
Chloride: 100 mmol/L (ref 98–111)
Creatinine, Ser: 1.11 mg/dL (ref 0.61–1.24)
GFR, Estimated: >60 mL/min (ref >60)
Glucose, Bld: 109 mg/dL — ABNORMAL HIGH (ref 70–99)
Potassium: 3.8 mmol/L (ref 3.5–5.1)
Sodium: 133 mmol/L — ABNORMAL LOW (ref 135–145)

## 2023-11-05 LAB — CBC
HCT: 39.4 % (ref 39.0–52.0)
Hemoglobin: 12.9 g/dL — ABNORMAL LOW (ref 13.0–17.0)
MCH: 25.6 pg — ABNORMAL LOW (ref 26.0–34.0)
MCHC: 32.7 g/dL (ref 30.0–36.0)
MCV: 78.3 fL — ABNORMAL LOW (ref 80.0–100.0)
Platelets: 256 10*3/uL (ref 150–400)
RBC: 5.03 MIL/uL (ref 4.22–5.81)
RDW: 15.4 % (ref 11.5–15.5)
WBC: 8.6 10*3/uL (ref 4.0–10.5)
nRBC: 0 % (ref 0.0–0.2)

## 2023-11-05 MED ORDER — GLUCERNA SHAKE PO LIQD: 237.0000 mL | Freq: Three times a day (TID) | ORAL | Status: DC

## 2023-11-05 MED ORDER — POTASSIUM CHLORIDE CRYS ER 20 MEQ PO TBCR
40.0000 meq | EXTENDED_RELEASE_TABLET | Freq: Once | ORAL | Status: AC
  Administered 2023-11-05: 40 meq via ORAL
  Filled 2023-11-05: qty 2

## 2023-11-05 MED ORDER — DAPAGLIFLOZIN PROPANEDIOL 10 MG PO TABS
10.0000 mg | ORAL_TABLET | Freq: Every day | ORAL | Status: DC
Start: 2023-11-05 — End: 2023-11-07
  Administered 2023-11-05 – 2023-11-07 (×3): 10 mg via ORAL
  Filled 2023-11-05 (×3): qty 1

## 2023-11-05 MED ORDER — SACUBITRIL-VALSARTAN 24-26 MG PO TABS
1.0000 | ORAL_TABLET | Freq: Two times a day (BID) | ORAL | Status: DC
  Administered 2023-11-05 – 2023-11-07 (×4): 1 via ORAL
  Filled 2023-11-05 (×4): qty 1

## 2023-11-05 MED ORDER — LORATADINE 10 MG PO TABS
10.0000 mg | ORAL_TABLET | Freq: Every day | ORAL | Status: DC
  Administered 2023-11-05 – 2023-11-07 (×3): 10 mg via ORAL
  Filled 2023-11-05 (×3): qty 1

## 2023-11-05 MED ORDER — FUROSEMIDE 10 MG/ML IJ SOLN
80.0000 mg | Freq: Two times a day (BID) | INTRAMUSCULAR | Status: DC
  Administered 2023-11-05 – 2023-11-06 (×4): 80 mg via INTRAVENOUS
  Filled 2023-11-05 (×4): qty 8

## 2023-11-05 NOTE — Plan of Care (Signed)
   Problem: Education: Goal: Ability to describe self-care measures that may prevent or decrease complications (Diabetes Survival Skills Education) will improve Outcome: Progressing   Problem: Coping: Goal: Ability to adjust to condition or change in health will improve Outcome: Progressing   Problem: Fluid Volume: Goal: Ability to maintain a balanced intake and output will improve Outcome: Progressing

## 2023-11-05 NOTE — Progress Notes (Signed)
    Advanced Heart Failure Rounding Note  PCP-Cardiologist: Julien Nordmann, MD   Subjective:     Feels and looks much better than yesterday now that diltiazem has been stopped.  Diuresed well with IV Lasix.  Discussed plan for cardioversion tomorrow.   Objective:   Weight Range: 104.3 kg Body mass index is 32.07 kg/m.   Vital Signs:   Temp:  [97.3 F (36.3 C)-98.6 F (37 C)] 98.5 F (36.9 C) (03/19 1230) Pulse Rate:  [90-125] 125 (03/19 1513) Resp:  [18-19] 18 (03/19 0436) BP: (107-146)/(66-91) 107/91 (03/19 1230) SpO2:  [96 %-99 %] 99 % (03/19 1230) FiO2 (%):  [28 %] 28 % (03/18 2255) Last BM Date : 11/04/23  Weight change: Filed Weights   11/03/23 1520  Weight: 104.3 kg    Intake/Output:   Intake/Output Summary (Last 24 hours) at 11/05/2023 1609 Last data filed at 11/05/2023 1114 Gross per 24 hour  Intake 636.91 ml  Output 1400 ml  Net -763.09 ml      Physical Exam    General: Chronically ill-appearing HEENT: NCAT Cor: Regular rate & rhythm. No rubs, gallops or murmurs. JVP mildly elevated.  No LE edema Lungs: Normal work of breathing, clear to auscultation bilaterally Abdomen: Mildly distended Extremities: no cyanosis, clubbing, rash Neuro: alert & orientedx3, cranial nerves grossly intact. moves all 4 extremities w/o difficulty. Affect pleasant Vascular: 2+ radial pulses    Telemetry   A-fib in the 110s  Patient Profile   Gabriel [name]. is a 53 y.o. male with a PMH of CAD, DM, HTN, HFrEF, PE on xarelto, VT s/p IC who is seen today for evaluation of new atrial fibrillation at the request of Dr. Sherryll Burger.   Assessment/Plan   Acute on chronic systolic heart failure: Volume status improving with IV Lasix, will continue for 2 additional doses today.  Can likely transition to orals tomorrow. -Continue IV Lasix 80 mg twice daily -Restart Farxiga 10 mg daily -Continue spironolactone 25 mg -Management of afib as below -Restarted Entresto 24/26 mg  twice daily   Atrial fibrillation with RVR: Avoid further nodal blocking agents, BP preserved and rates relatively well controlled.  Plan for cardioversion tomorrow.  Was on Ozempic, will discuss with anesthesiology.  If need be could use moderate sedation. -Continue IV amiodarone. -Continue xarelto, has not missed any doses -plan for DCCV on Thursday after diuresis   CKD: Creatinine improving with diuresis. - Conitnue diuresis as above - Restart SGLT2   CAD: Known from previous cath, poor revascularization targets. - Continue xarelto, fenofibrate, crestor   HTN: Management as above   OSA: Compliant with CPAP   Length of Stay: 2  Romie Minus, MD  11/05/2023, 4:09 PM  Advanced Heart Failure Team Pager [phone] (M-F; 7a - 5p)  Please contact [facility] for night-coverage after hours (5p -7a ) and weekends on [email or web address]

## 2023-11-05 NOTE — Progress Notes (Signed)
  Patient Name: Gabriel [name]. Date of Encounter: 11/05/2023  Primary Cardiologist: Julien Nordmann, MD Electrophysiologist: Lanier Prude, MD  Interval Summary   NAEON Transferred from ER to tele bed late yesterday. He is feeling better, somewhat improved appetite. Abd feels less tight.   He denies chest pain, chest pressure. Breathing comfortable.  Vital Signs    Vitals:   11/04/23 2303 11/05/23 0436 11/05/23 0829 11/05/23 1230  BP: (!) 142/86 120/81 124/84 (!) 107/91  Pulse: (!) 107 91 94 90  Resp: 18 18    Temp: 97.7 F (36.5 C) (!) 97.3 F (36.3 C) 97.9 F (36.6 C) 98.5 F (36.9 C)  TempSrc: Oral Oral Oral   SpO2: 96% 99% 97% 99%  Weight:      Height:        Intake/Output Summary (Last 24 hours) at 11/05/2023 1340 Last data filed at 11/05/2023 1114 Gross per 24 hour  Intake 636.91 ml  Output 2400 ml  Net -1763.09 ml   Filed Weights   11/03/23 1520  Weight: 104.3 kg    Physical Exam    GEN- The patient is well appearing, alert and oriented x 3 today.   Lungs- Clear to ausculation bilaterally, normal work of breathing Cardiac- Irregularly irregular, tachycardic rate and rhythm, no murmurs, rubs or gallops GI- soft, NT, ND, + BS Extremities- no clubbing or cyanosis. No edema  Telemetry    AFib 90-120s (personally reviewed)  Hospital Course    Gabriel N Cornel Werber. is a 53 y.o. 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, OSA on CPAP, T2DM  admitted for AFib w RVR  Assessment & Plan    #) AFib w RVR Relatively new diagnosis On xarelto for h/o PE no missed doses AAD options quite limited d/t history of CAD and prolonged QTC  Continue IV amiodarone 30mg /hr until after DCCV tomorrow, then consider transition to PO Keep K > 4, Mag > 2, Will replete K HF team planning on DCCV 3/20 at 1p NPO for procedure    #) Osa on CPAP Continue nightly use while inpatient   #) HFmrEF HF team is on board, managing GDMT and  diuretics         For questions or updates, please contact [facility] Please consult [email or web address] for contact info under Cardiology/STEMI.  Signed, Sherie Don, NP  11/05/2023, 1:40 PM

## 2023-11-05 NOTE — Progress Notes (Signed)
 PHARMACY CONSULT NOTE - ELECTROLYTES  Pharmacy Consult for Electrolyte Monitoring and Replacement   Recent Labs: Height: 5\' 11"  (180.3 cm) Weight: 104.3 kg (229 lb 15 oz) IBW/kg (Calculated) : 75.3 Estimated Creatinine Clearance: 93.5 mL/min (by C-G formula based on SCr of 1.11 mg/dL). Potassium (mmol/L)  Date Value  11/05/2023 3.8  10/07/2013 3.8   Magnesium (mg/dL)  Date Value  25/95/6387 1.9   Calcium (mg/dL)  Date Value  56/43/3295 8.9   Calcium, Total (mg/dL)  Date Value  18/84/1660 8.9   Albumin (g/dL)  Date Value  63/08/6008 4.1  08/09/2022 5.0 (H)  11/12/2012 4.2   Phosphorus (mg/dL)  Date Value  93/23/5573 3.9   Sodium (mmol/L)  Date Value  11/05/2023 133 (L)  08/29/2023 139  10/07/2013 139    Assessment  Gabriel [name]. is a 53 y.o. male presenting with dizziness and atrial fibrillation. PMH significant for CAD, non-insulin-dependent diabetes mellitus, hypertension, history of PE on Eliquis, history of nonsustained V. tach status post AICD. Pharmacy has been consulted to monitor and replace electrolytes.  Diet: PO, thin fluids MIVF: N/A Pertinent medications: spirolactone, amiodarone  Goal of Therapy: Optimize K>4, Mag>2 given A-fib All other electrolytes WNL  Plan:  K 3.8, replace with Kcl po x1 dose Renal function panel and Mg with AM labs  Thank you for allowing pharmacy to be a part of this patient's care.  Shandon Matson Rodriguez-Guzman PharmD, BCPS 11/05/2023 7:55 AM

## 2023-11-05 NOTE — H&P (View-Only) (Signed)
    Advanced Heart Failure Rounding Note  PCP-Cardiologist: Julien Nordmann, MD   Subjective:     Feels and looks much better than yesterday now that diltiazem has been stopped.  Diuresed well with IV Lasix.  Discussed plan for cardioversion tomorrow.   Objective:   Weight Range: 104.3 kg Body mass index is 32.07 kg/m.   Vital Signs:   Temp:  [97.3 F (36.3 C)-98.6 F (37 C)] 98.5 F (36.9 C) (03/19 1230) Pulse Rate:  [90-125] 125 (03/19 1513) Resp:  [18-19] 18 (03/19 0436) BP: (107-146)/(66-91) 107/91 (03/19 1230) SpO2:  [96 %-99 %] 99 % (03/19 1230) FiO2 (%):  [28 %] 28 % (03/18 2255) Last BM Date : 11/04/23  Weight change: Filed Weights   11/03/23 1520  Weight: 104.3 kg    Intake/Output:   Intake/Output Summary (Last 24 hours) at 11/05/2023 1609 Last data filed at 11/05/2023 1114 Gross per 24 hour  Intake 636.91 ml  Output 1400 ml  Net -763.09 ml      Physical Exam    General: Chronically ill-appearing HEENT: NCAT Cor: Regular rate & rhythm. No rubs, gallops or murmurs. JVP mildly elevated.  No LE edema Lungs: Normal work of breathing, clear to auscultation bilaterally Abdomen: Mildly distended Extremities: no cyanosis, clubbing, rash Neuro: alert & orientedx3, cranial nerves grossly intact. moves all 4 extremities w/o difficulty. Affect pleasant Vascular: 2+ radial pulses    Telemetry   A-fib in the 110s  Patient Profile   Gabriel [name]. is a 53 y.o. male with a PMH of CAD, DM, HTN, HFrEF, PE on xarelto, VT s/p IC who is seen today for evaluation of new atrial fibrillation at the request of Dr. Sherryll Burger.   Assessment/Plan   Acute on chronic systolic heart failure: Volume status improving with IV Lasix, will continue for 2 additional doses today.  Can likely transition to orals tomorrow. -Continue IV Lasix 80 mg twice daily -Restart Farxiga 10 mg daily -Continue spironolactone 25 mg -Management of afib as below -Restarted Entresto 24/26 mg  twice daily   Atrial fibrillation with RVR: Avoid further nodal blocking agents, BP preserved and rates relatively well controlled.  Plan for cardioversion tomorrow.  Was on Ozempic, will discuss with anesthesiology.  If need be could use moderate sedation. -Continue IV amiodarone. -Continue xarelto, has not missed any doses -plan for DCCV on Thursday after diuresis   CKD: Creatinine improving with diuresis. - Conitnue diuresis as above - Restart SGLT2   CAD: Known from previous cath, poor revascularization targets. - Continue xarelto, fenofibrate, crestor   HTN: Management as above   OSA: Compliant with CPAP   Length of Stay: 2  Gabriel Minus, MD  11/05/2023, 4:09 PM  Advanced Heart Failure Team Pager [phone] (M-F; 7a - 5p)  Please contact [facility] for night-coverage after hours (5p -7a ) and weekends on [email or web address]

## 2023-11-05 NOTE — Progress Notes (Signed)
 1      PROGRESS NOTE    Gabriel [name].  FAO:[id number] DOB: 06-Feb-1971 DOA: 11/03/2023 PCP: Sallee Provencal, FNP    Brief Narrative:   53 year old male with history of CAD, non-insulin-dependent diabetes mellitus, hypertension, history of PE on Eliquis, history of nonsustained V. tach status post AICD, who presents emergency department for chief concerns of dizziness and atrial fibrillation   3/18: Advanced CHF team consult/evaluation   Assessment & Plan:   Principal Problem:   Atrial fibrillation with RVR (HCC) Active Problems:   Chronic obstructive pulmonary disease (COPD) (HCC)   NICM (nonischemic cardiomyopathy) (HCC)   Type 2 diabetes mellitus with hyperlipidemia (HCC)   Type II diabetes mellitus with renal manifestations (HCC)   OSA on CPAP   Depression, recurrent (HCC)   * Atrial fibrillation with RVR (HCC) Acute on chronic systolic heart failure:  -Normal TSH and pending echo this admission - Farxiga on hold Currently no symptoms reported by patient concerning for infectious etiology Chest x-ray suggestive of bronchitis changes.  No pneumonia Advanced heart failure team c/s Avoid Cardizem and any fluids.  Status post IV Lasix and starting amiodarone per Dr. Elwyn Lade Tentative plan for cardioversion on Thursday  Depression, recurrent (HCC) Home fluoxetine 20 mg every evening resumed Bupropion 150 mg daily resumed   Type II diabetes mellitus with renal manifestations (HCC) Home long-acting insulin 36 units nightly resumed Insulin SSI with at bedtime coverage ordered Home Ozempic will not be resumed on admission   Type 2 diabetes mellitus with hyperlipidemia (HCC) Home fenofibrate equivalent resumed on admission Rosuvastatin 20 mg nightly resumed  History of pulmonary embolism Continue Xarelto   OSA on CPAP CPAP nightly    DVT prophylaxis: Xarelto Place TED hose Start: 11/03/23 1426 rivaroxaban (XARELTO) tablet 20 mg     Code Status: Full  code  Family Communication: (discussed with patient)  Disposition Plan: Possible discharge in 3 to 4 days depending on clinical condition and cardiac evaluation   Consultants:  Cardiology  Procedures:  Cardioversion planned for Thursday 3/20    Subjective:  Pt reports feeling little bit better.  Nausea overnight, no vomiting, feels better today.  No other acute complaints.   Objective: Vitals:   11/04/23 2303 11/05/23 0436 11/05/23 0829 11/05/23 1230  BP: (!) 142/86 120/81 124/84 (!) 107/91  Pulse: (!) 107 91 94 90  Resp: 18 18    Temp: 97.7 F (36.5 C) (!) 97.3 F (36.3 C) 97.9 F (36.6 C) 98.5 F (36.9 C)  TempSrc: Oral Oral Oral   SpO2: 96% 99% 97% 99%  Weight:      Height:        Intake/Output Summary (Last 24 hours) at 11/05/2023 1244 Last data filed at 11/05/2023 1114 Gross per 24 hour  Intake 636.91 ml  Output 2400 ml  Net -1763.09 ml   Filed Weights   11/03/23 1520  Weight: 104.3 kg    Examination:  General exam: awake, alert, no acute distress HEENT: moist mucus membranes, hearing grossly normal  Respiratory system: CTAB, no wheezes, rales or rhonchi, normal respiratory effort. Cardiovascular system: normal S1/S2, RRR, no pedal edema.   Gastrointestinal system: soft, NT, ND Central nervous system: A&O x 3. no gross focal neurologic deficits, normal speech Extremities: moves all, no edema, normal tone Skin: dry, intact, normal temperature Psychiatry: normal mood, congruent affect, judgement and insight appear normal     Data Reviewed:   3/19 Notable labs -- Na 133, glucose 109, BUN 25 otherwise normal  BMP.  Hbg 12.9 stable  ECHO 11/04/23 -- EF 35-40%, global LV hypokinesis, severely dilated LV internal cavity size, mild LVH, small pericardial effusion, mild MR   Time spent: 38 minutes    Pennie Banter, DO [facility] Pager [phone]  If 7PM-7AM, please contact night-coverage [email or web address]  11/05/2023, 12:44 PM

## 2023-11-05 NOTE — Plan of Care (Signed)
 Problem: Education: Goal: Ability to describe self-care measures that may prevent or decrease complications (Diabetes Survival Skills Education) will improve 11/05/2023 1804 by Lucky Cowboy, RN Outcome: Progressing 11/05/2023 1804 by Lucky Cowboy, RN Outcome: Progressing Goal: Individualized Educational Video(s) 11/05/2023 1804 by Lucky Cowboy, RN Outcome: Progressing 11/05/2023 1804 by Lucky Cowboy, RN Outcome: Progressing   Problem: Coping: Goal: Ability to adjust to condition or change in health will improve 11/05/2023 1804 by Lucky Cowboy, RN Outcome: Progressing 11/05/2023 1804 by Lucky Cowboy, RN Outcome: Progressing   Problem: Fluid Volume: Goal: Ability to maintain a balanced intake and output will improve 11/05/2023 1804 by Lucky Cowboy, RN Outcome: Progressing 11/05/2023 1804 by Lucky Cowboy, RN Outcome: Progressing   Problem: Health Behavior/Discharge Planning: Goal: Ability to identify and utilize available resources and services will improve 11/05/2023 1804 by Lucky Cowboy, RN Outcome: Progressing 11/05/2023 1804 by Lucky Cowboy, RN Outcome: Progressing Goal: Ability to manage health-related needs will improve 11/05/2023 1804 by Lucky Cowboy, RN Outcome: Progressing 11/05/2023 1804 by Lucky Cowboy, RN Outcome: Progressing   Problem: Metabolic: Goal: Ability to maintain appropriate glucose levels will improve 11/05/2023 1804 by Lucky Cowboy, RN Outcome: Progressing 11/05/2023 1804 by Marinus Maw D, RN Outcome: Progressing   Problem: Nutritional: Goal: Maintenance of adequate nutrition will improve 11/05/2023 1804 by Lucky Cowboy, RN Outcome: Progressing 11/05/2023 1804 by Lucky Cowboy, RN Outcome: Progressing Goal: Progress toward achieving an optimal weight will improve 11/05/2023 1804 by Lucky Cowboy, RN Outcome: Progressing 11/05/2023 1804 by Marinus Maw D, RN Outcome: Progressing   Problem: Skin  Integrity: Goal: Risk for impaired skin integrity will decrease 11/05/2023 1804 by Lucky Cowboy, RN Outcome: Progressing 11/05/2023 1804 by Marinus Maw D, RN Outcome: Progressing   Problem: Tissue Perfusion: Goal: Adequacy of tissue perfusion will improve 11/05/2023 1804 by Lucky Cowboy, RN Outcome: Progressing 11/05/2023 1804 by Lucky Cowboy, RN Outcome: Progressing   Problem: Education: Goal: Knowledge of General Education information will improve Description: Including pain rating scale, medication(s)/side effects and non-pharmacologic comfort measures 11/05/2023 1804 by Lucky Cowboy, RN Outcome: Progressing 11/05/2023 1804 by Lucky Cowboy, RN Outcome: Progressing   Problem: Health Behavior/Discharge Planning: Goal: Ability to manage health-related needs will improve 11/05/2023 1804 by Lucky Cowboy, RN Outcome: Progressing 11/05/2023 1804 by Lucky Cowboy, RN Outcome: Progressing   Problem: Clinical Measurements: Goal: Ability to maintain clinical measurements within normal limits will improve 11/05/2023 1804 by Lucky Cowboy, RN Outcome: Progressing 11/05/2023 1804 by Marinus Maw D, RN Outcome: Progressing Goal: Will remain free from infection 11/05/2023 1804 by Lucky Cowboy, RN Outcome: Progressing 11/05/2023 1804 by Marinus Maw D, RN Outcome: Progressing Goal: Diagnostic test results will improve 11/05/2023 1804 by Lucky Cowboy, RN Outcome: Progressing 11/05/2023 1804 by Marinus Maw D, RN Outcome: Progressing Goal: Respiratory complications will improve 11/05/2023 1804 by Marinus Maw D, RN Outcome: Progressing 11/05/2023 1804 by Marinus Maw D, RN Outcome: Progressing Goal: Cardiovascular complication will be avoided 11/05/2023 1804 by Lucky Cowboy, RN Outcome: Progressing 11/05/2023 1804 by Lucky Cowboy, RN Outcome: Progressing   Problem: Activity: Goal: Risk for activity intolerance will decrease 11/05/2023 1804 by Lucky Cowboy, RN Outcome: Progressing 11/05/2023 1804 by Marinus Maw D, RN Outcome: Progressing   Problem: Nutrition: Goal: Adequate nutrition will be maintained 11/05/2023 1804 by Lucky Cowboy, RN Outcome: Progressing 11/05/2023 1804 by Lucky Cowboy, RN Outcome: Progressing  Problem: Coping: Goal: Level of anxiety will decrease 11/05/2023 1804 by Lucky Cowboy, RN Outcome: Progressing 11/05/2023 1804 by Lucky Cowboy, RN Outcome: Progressing   Problem: Elimination: Goal: Will not experience complications related to bowel motility Outcome: Progressing Goal: Will not experience complications related to urinary retention Outcome: Progressing   Problem: Pain Managment: Goal: General experience of comfort will improve and/or be controlled Outcome: Progressing   Problem: Safety: Goal: Ability to remain free from injury will improve Outcome: Progressing   Problem: Skin Integrity: Goal: Risk for impaired skin integrity will decrease Outcome: Progressing

## 2023-11-05 NOTE — Evaluation (Signed)
 Physical Therapy Evaluation Patient Details Name: Gabriel [name]. MRN: [id number] DOB: 29-Oct-1970 Today's Date: 11/05/2023  History of Present Illness  presented to ER secondary to dizziness, SOB, elevated HR with activity; admitted for management of Afib with RVR.  Scheduled for DCCV 3/20  Clinical Impression  Patient currently resting in bed upon arrival to room; alert and oriented, follows commands and agreeable to participation with session.  Denies pain; denies episodes of dizziness, SOB this date.  Bilat UE/LE strength and ROM grossly symmetrical and WFL; no focal weakness appreciated.  Able to complete bed mobility, sit/stand, basic transfers and gait (220') without assist device, indep.  Demonstrates reciprocal stepping pattern with good step height/lenght, good trunk rotation, arm swing and overall cadence. Confident and comfortable gait mechanics; endorses baseline performance for him. No SOB, dizziness reported with gait efforts; HR max 125 with gait, quickly recovering to baseline low-100s with seated rest  Appears to be at baseline level of functional ability; no acute PT needs identified.  Will complete PT orders; please reconsult should needs change.        If plan is discharge home, recommend the following:     Can travel by private vehicle        Equipment Recommendations    Recommendations for Other Services       Functional Status Assessment Patient has not had a recent decline in their functional status     Precautions / Restrictions Precautions Precautions: None Restrictions Weight Bearing Restrictions Per Provider Order: No      Mobility  Bed Mobility Overal bed mobility: Independent                  Transfers Overall transfer level: Independent Equipment used: None                    Ambulation/Gait Ambulation/Gait assistance: Independent Gait Distance (Feet): 220 Feet Assistive device: None         General Gait Details:  reciprocal stepping pattern with good step height/lenght, good trunk rotation, arm swing and overall cadence.  Confident and comfortable gait mechanics; endorses baseline performance for him. No SOB, dizziness reported with gait efforts; HR max 125 with gait, quickly recovering to baseline low-100s with seated rest  Stairs            Wheelchair Mobility     Tilt Bed    Modified Rankin (Stroke Patients Only)       Balance Overall balance assessment: Modified Independent                                           Pertinent Vitals/Pain Pain Assessment Pain Assessment: No/denies pain    Home Living Family/patient expects to be discharged to:: Private residence Living Arrangements: Parent   Type of Home: House Home Access: Stairs to enter   Secretary/administrator of Steps: 2 Alternate Level Stairs-Number of Steps: 12 Home Layout: Two level;Bed/bath upstairs Home Equipment: None      Prior Function Prior Level of Function : Independent/Modified Independent                     Extremity/Trunk Assessment   Upper Extremity Assessment Upper Extremity Assessment: Overall WFL for tasks assessed    Lower Extremity Assessment Lower Extremity Assessment: Overall WFL for tasks assessed       Communication  Cognition Arousal: Alert Behavior During Therapy: WFL for tasks assessed/performed   PT - Cognitive impairments: No apparent impairments                                 Cueing       General Comments      Exercises     Assessment/Plan    PT Assessment Patient does not need any further PT services  PT Problem List         PT Treatment Interventions      PT Goals (Current goals can be found in the Care Plan section)  Acute Rehab PT Goals Patient Stated Goal: to move around a little more PT Goal Formulation: All assessment and education complete, DC therapy Time For Goal Achievement: 11/05/23 Potential  to Achieve Goals: Good    Frequency       Co-evaluation               AM-PAC PT "6 Clicks" Mobility  Outcome Measure Help needed turning from your back to your side while in a flat bed without using bedrails?: None Help needed moving from lying on your back to sitting on the side of a flat bed without using bedrails?: None Help needed moving to and from a bed to a chair (including a wheelchair)?: None Help needed standing up from a chair using your arms (e.g., wheelchair or bedside chair)?: None Help needed to walk in hospital room?: None Help needed climbing 3-5 steps with a railing? : None 6 Click Score: 24    End of Session   Activity Tolerance: Patient tolerated treatment well Patient left: in bed   PT Visit Diagnosis: Difficulty in walking, not elsewhere classified (R26.2)    Time: 6387-5643 PT Time Calculation (min) (ACUTE ONLY): 11 min   Charges:   PT Evaluation $PT Eval Low Complexity: 1 Low   PT General Charges $$ ACUTE PT VISIT: 1 Visit         Charidy Cappelletti H. Manson Passey, PT, DPT, NCS 11/05/23, 3:22 PM [phone]

## 2023-11-06 ENCOUNTER — Inpatient Hospital Stay: Admitting: Anesthesiology

## 2023-11-06 ENCOUNTER — Other Ambulatory Visit: Payer: Self-pay

## 2023-11-06 ENCOUNTER — Encounter
Admission: EM | Disposition: A | Discharge status: Home / Self Care | Payer: Self-pay | Source: Home / Self Care | Attending: Internal Medicine

## 2023-11-06 ENCOUNTER — Inpatient Hospital Stay: Admitting: Family Medicine

## 2023-11-06 DIAGNOSIS — I5023 Acute on chronic systolic (congestive) heart failure: Secondary | ICD-10-CM | POA: Diagnosis not present

## 2023-11-06 DIAGNOSIS — I4891 Unspecified atrial fibrillation: Secondary | ICD-10-CM

## 2023-11-06 HISTORY — PX: CARDIOVERSION: SHX1299

## 2023-11-06 LAB — RENAL FUNCTION PANEL
Albumin: 2.9 g/dL — ABNORMAL LOW (ref 3.5–5.0)
Anion gap: 13 (ref 5–15)
BUN: 29 mg/dL — ABNORMAL HIGH (ref 6–20)
CO2: 26 mmol/L (ref 22–32)
Calcium: 8.9 mg/dL (ref 8.9–10.3)
Chloride: 96 mmol/L — ABNORMAL LOW (ref 98–111)
Creatinine, Ser: 1.2 mg/dL (ref 0.61–1.24)
GFR, Estimated: >60 mL/min (ref >60)
Glucose, Bld: 121 mg/dL — ABNORMAL HIGH (ref 70–99)
Phosphorus: 4.4 mg/dL (ref 2.5–4.6)
Potassium: 3.7 mmol/L (ref 3.5–5.1)
Sodium: 135 mmol/L (ref 135–145)

## 2023-11-06 LAB — GLUCOSE, CAPILLARY
Glucose-Capillary: 128 mg/dL — ABNORMAL HIGH (ref 70–99)
Glucose-Capillary: 112 mg/dL — ABNORMAL HIGH (ref 70–99)
Glucose-Capillary: 126 mg/dL — ABNORMAL HIGH (ref 70–99)
Glucose-Capillary: 106 mg/dL — ABNORMAL HIGH (ref 70–99)

## 2023-11-06 LAB — MAGNESIUM: Magnesium: 1.9 mg/dL (ref 1.7–2.4)

## 2023-11-06 SURGERY — CARDIOVERSION
Anesthesia: General

## 2023-11-06 MED ORDER — MAGNESIUM SULFATE 2 GM/50ML IV SOLN
2.0000 g | Freq: Once | INTRAVENOUS | Status: AC
  Administered 2023-11-06: 2 g via INTRAVENOUS
  Filled 2023-11-06: qty 50

## 2023-11-06 MED ORDER — POTASSIUM CHLORIDE CRYS ER 20 MEQ PO TBCR
40.0000 meq | EXTENDED_RELEASE_TABLET | ORAL | Status: AC
  Administered 2023-11-06: 40 meq via ORAL
  Filled 2023-11-06: qty 2

## 2023-11-06 MED ORDER — PROPOFOL 10 MG/ML IV BOLUS
INTRAVENOUS | Status: DC | PRN
  Administered 2023-11-06: 100 mg via INTRAVENOUS

## 2023-11-06 MED ORDER — LIDOCAINE HCL (PF) 2 % IJ SOLN
INTRAMUSCULAR | Status: DC | PRN
  Administered 2023-11-06: 100 mg via INTRADERMAL

## 2023-11-06 MED ORDER — SODIUM CHLORIDE 0.9 % IV SOLN: INTRAVENOUS | Status: DC | PRN

## 2023-11-06 NOTE — Plan of Care (Signed)

## 2023-11-06 NOTE — CV Procedure (Signed)
   DIRECT CURRENT CARDIOVERSION  NAME:  Gabriel [name].    MRN: [id number] DOB:  12-03-1970    ADMIT DATE: 11/03/2023  CARDIOVERSION:     Indications:  Symptomatic Atrial Fibrillation  Informed consent was obtained prior to the procedure. The risks, benefits and alternatives for the procedure were discussed and the patient comprehended these risks.  Risks include, but are not limited to treatment failure, burns to the chest, pain/discomfort, ventricular arrhythmia.   After a procedural time-out, sedation was performed by anesthesia. The patient had the defibrillator pads placed in the anterior and posterior position. Once an appropriate level of sedation was confirmed, the patient was cardioverted successfully with 200J of biphasic synchronized energy.  The patient converted to NSR.  There were no apparent complications.  The patient had normal neuro status and respiratory status post procedure with vitals stable as recorded elsewhere.  Adequate airway was maintained throughout and vital signs monitored per protocol.  COMPLICATIONS:    Complications: No complications Patient tolerated procedure well.  Clearnce Hasten Advanced Heart Failure 4:18 PM

## 2023-11-06 NOTE — Progress Notes (Addendum)
 1      PROGRESS NOTE    Gabriel [name].  ZOX:[id number] DOB: [birth year] DOA: 11/03/2023 PCP: Sallee Provencal, FNP    Brief Narrative:   53 year old male with history of CAD, non-insulin-dependent diabetes mellitus, hypertension, history of PE on Eliquis, history of nonsustained V. tach status post AICD, who presents emergency department for chief concerns of dizziness and atrial fibrillation   3/18: Advanced CHF team consult/evaluation   Assessment & Plan:   Principal Problem:   Atrial fibrillation with RVR (HCC) Active Problems:   Chronic obstructive pulmonary disease (COPD) (HCC)   NICM (nonischemic cardiomyopathy) (HCC)   Type 2 diabetes mellitus with hyperlipidemia (HCC)   Type II diabetes mellitus with renal manifestations (HCC)   OSA on CPAP   Depression, recurrent (HCC)   * Atrial fibrillation with RVR (HCC) Acute on chronic systolic heart failure:  Echo 8/11 -- EF 35-40%, global LV hypokinesis, severely dilated LV internal cavity size, mild LVH, small pericardial effusion, mild MR Normal TSH Farxiga on hold Currently no symptoms reported by patient concerning for infectious etiology Chest x-ray suggestive of bronchitis changes.  No pneumonia Advanced heart failure team c/s Avoid Cardizem and any fluids. 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 Complicates overall care and prognosis.  Recommend lifestyle modifications including physical activity and diet for weight loss and overall long-term health.   DVT prophylaxis: Xarelto Place TED hose Start: 11/03/23 1426 rivaroxaban (XARELTO) tablet 20 mg     Code Status: Full code  Family Communication: (discussed with patient)  Disposition Plan: Possible discharge home tomorrow 3/21 if cleared by cardiology   Consultants:  Cardiology  Procedures:  Cardioversion planned for today 3/20    Subjective:  Pt awake resting in bed.  No acute complaints. Going for cardioversion soon.   Objective: Vitals:   11/05/23 2015 11/05/23 2321 11/06/23 0501 11/06/23 0811  BP: 105/89 113/83 117/75 102/72  Pulse: (!) 106 97 97 93  Resp: 19 18 18 16   Temp: 97.7 F (36.5 C) (!) 97.5 F (36.4 C) 98.4 F (36.9 C) 97.9 F (36.6 C)  TempSrc:    Oral  SpO2: 98% 100% 98% 98%  Weight:      Height:        Intake/Output Summary (Last 24 hours) at 11/06/2023 1115 Last data filed at 11/06/2023 0554 Gross per 24 hour  Intake 684.46 ml  Output 2510 ml  Net -1825.54 ml   Filed Weights   11/03/23 1520  Weight: 104.3 kg    Examination:  General exam: awake, alert, no acute distress  Respiratory system: CTA, no wheezes, rales or rhonchi, normal respiratory effort. Cardiovascular system: normal S1/S2, RRR, no pedal edema.   Gastrointestinal system: soft, NT, ND, no HSM felt, +bowel sounds. Central nervous system: A&O x 3. no gross focal neurologic deficits, normal speech Extremities: moves all, no edema, normal  tone Skin: dry, intact, normal temperature Psychiatry: normal mood, congruent affect, judgement and insight appear normal      Data Reviewed:   3/20 Notable labs -- Cl 96, glucose 121, BUN 29 otherwise normal BMP.   Albumin 2.9  Last  Hbg 12.9 stable  ECHO 11/04/23 -- EF 35-40%, global LV hypokinesis, severely dilated LV internal cavity size,  mild LVH, small pericardial effusion, mild MR   Time spent: 36 minutes    Pennie Banter, DO [facility]   11/06/2023, 11:15 AM

## 2023-11-06 NOTE — Anesthesia Preprocedure Evaluation (Addendum)
 Anesthesia Evaluation  Patient identified by MRN, date of birth, ID band Patient awake    Reviewed: Allergy & Precautions, NPO status , Patient's Chart, lab work & pertinent test results  History of Anesthesia Complications Negative for: history of anesthetic complications  Airway Mallampati: III  TM Distance: <3 FB Neck ROM: full    Dental no notable dental hx.    Pulmonary neg shortness of breath, sleep apnea , COPD (97% on RA), former smoker   Pulmonary exam normal        Cardiovascular Exercise Tolerance: Good hypertension, pulmonary hypertension (R/LHC 08/18/2020: mRA 5, mPA 41, mPCWP 18, AO sat 92, PA sat 59, CO 4.3, CI 1.9, PVR 5.3)(-) angina + CAD, + Past MI, +CHF and + DOE  + dysrhythmias + Cardiac Defibrillator  Rhythm:Irregular Rate:Tachycardia - Peripheral Edema    Neuro/Psych  PSYCHIATRIC DISORDERS      CVA    GI/Hepatic negative GI ROS,,,(+) Hepatitis -  Endo/Other  diabetes, Type 2    Renal/GU Renal disease     Musculoskeletal   Abdominal   Peds  Hematology negative hematology ROS (+)   Anesthesia Other Findings Past Medical History: 11/28/2016: Aortic root dilation (HCC)     Comment:  a.) TTE 11/28/2016: 43 mm; b.) TTE 11/23/2019: 38 mm;               c.) TTE 08/14/2022: 40 mm; d.) TTE 03/08/2023: 43 mm No date: Arteriosclerosis of right carotid artery     Comment:  a.) carotid doppler 04/29/2017: <50% RICA 07/2023: Bronchitis No date: CAD (coronary artery disease)     Comment:  a.) MV 04/2015: low risk; b.) LHC 01/06/2017: minor irregs              in LAD/Diag/LCX/OM, RCA 40p/m/d; c.) R/LHC 06/13/2020:               50% dLAD, 30% p-mLCx, 40% OM1, 100% pRCA (L-R collats) -               med mgmt; d.) R/LHC 08/19/2023: 50% dLAD, 30% p-mLCx, 40%              OM1, 100% pRCA (L-R collats), 50% p-mRCA - med mgmt No date: Chest wall pain, chronic No date: Chronic 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 No  date: Chronic Troponin Elevation No date: CKD (chronic kidney disease), stage II-III No date: COPD (chronic obstructive pulmonary disease) (HCC) No date: Depression No date: DM (diabetes mellitus), type 2 (HCC) No date: HFrEF (heart failure with reduced ejection fraction) (HCC)     Comment:  a.) EF 03/20/2015: EF 45-50%; b.) MV 05/19/2015: EF               30-44%; c.) TTE 01/04/2016: EF 20-25%; d.) TTE               02/29/2016: EF 30-35%; e.) TTE 11/28/2016: EF 40-45%; f.)              LHC 01/06/2017: EF 25-35%; g.) TTE 04/29/2017: EF 45-50%;              h.) TTE 06/26/2019: EF 30-35%; i.) TTE 11/23/2019: EF               25-30%; j.) TTE 06/08/2020: EF 35-40%; k.) TTE               05/21/2021: EF 40-45%; l.) TTE 08/14/2022: EF 40-45%; m.)              TTE 03/08/2023:  EF 40-45% 03/01/2022: History of 2019 novel coronavirus disease (COVID-19) No date: History of methicillin resistant staphylococcus aureus (MRSA) No date: Hyperlipidemia No date: Hypertension 05/20/2022: ICD (implantable cardioverter-defibrillator) in place     Comment:  a.) [employer] MRI S-ICD (SN: [phone]) No date: Left rotator cuff tear No date: Long term current use of immunosuppressive drug     Comment:  a.) ixekizumab for psoriasis/psoriatic arthritis               diagnosis No date: Long-term use of aspirin therapy No date: Mediastinal lymphadenopathy No date: Mitral regurgitation     Comment:  Mild to moderate by October 2021 echocardiogram. 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 12/2015 noted on tele-->amio;  b. 12/2015 Event               monitor: no VT noted. No date:  Obesity (BMI 30.0-34.9) No date: On rivaroxaban therapy No date: OSA treated with BiPAP No date: Polyp of descending colon No date: Psoriasis No date: Psoriatic arthritis (HCC)     Comment:  a.) Tx'd with ixekizumab 02/29/2016: Pulmonary HTN (HCC)     Comment:  a.) TTE 02/29/2016: RVSP 47; b.) TTE 06/26/2019: RVSP               58.4; c.) TTE 12/08/2019: RVSP 40.8; d.) TTE 06/08/2020:               RVSP 44.7; e.) R/LHC 06/08/2020: mRA 9, mPA 40, mPWCP 17,              AO sat 98, PA sat 61, CO 3.1, CI 1.3, PVR 7.4, LVEDP 20;               f.) R/LHC 08/18/2020: mRA 5, mPA 41, mPCWP 18, AO sat 92,              PA sat 59, CO 4.3, CI 1.9, PVR 5.3 06/07/2020: Recurrent pulmonary emboli (HCC)     Comment:  06/07/20: small bilateral PEs.  12/31/19: RUL and RLL               PEs. No date: Stroke [facility])     Comment:  pt states per mri in 2023-unsure of when stroke happened No date: Syncope     Comment:  a. 01/2016 - felt to be vasovagal. No date: Thrombocytopenia (HCC)  Past Surgical History: 08/01/2022: COLONOSCOPY WITH PROPOFOL; N/A     Comment:  Procedure: COLONOSCOPY WITH PROPOFOL;  Surgeon: Toney Reil, MD;  Location: [facility] ENDOSCOPY;  Service:               Gastroenterology;  Laterality: N/A; 08/01/2022: ESOPHAGOGASTRODUODENOSCOPY (EGD) WITH PROPOFOL; N/A     Comment:  Procedure: ESOPHAGOGASTRODUODENOSCOPY (EGD) WITH               PROPOFOL;  Surgeon: Toney Reil, MD;  Location:               [facility] ENDOSCOPY;  Service: Gastroenterology;  Laterality:  N/A; 10/16/2022: ESOPHAGOGASTRODUODENOSCOPY (EGD) WITH PROPOFOL; N/A     Comment:  Procedure: ESOPHAGOGASTRODUODENOSCOPY (EGD) WITH               PROPOFOL;  Surgeon: Toney Reil, MD;  Location:               [facility] ENDOSCOPY;  Service: Gastroenterology;  Laterality:               N/A; No date: FINGER AMPUTATION; Left     Comment:  Traumatic 01/06/2017: LEFT HEART CATH AND CORONARY ANGIOGRAPHY;  N/A     Comment:  Procedure: Left Heart Cath and Coronary Angiography;                Surgeon: Iran Ouch, MD;  Location: [facility] INVASIVE               CV LAB;  Service: Cardiovascular;  Laterality: N/A; 06/13/2020: RIGHT/LEFT HEART CATH AND CORONARY ANGIOGRAPHY; N/A     Comment:  Procedure: RIGHT/LEFT HEART CATH AND CORONARY               ANGIOGRAPHY;  Surgeon: Yvonne Kendall, MD;  Location:               [facility] INVASIVE CV LAB;  Service: Cardiovascular;                Laterality: N/A; 08/19/2023: RIGHT/LEFT HEART CATH AND CORONARY ANGIOGRAPHY; Bilateral     Comment:  Procedure: RIGHT/LEFT HEART CATH AND CORONARY               ANGIOGRAPHY;  Surgeon: Yvonne Kendall, MD;  Location:               [facility] INVASIVE CV LAB;  Service: Cardiovascular;                Laterality: Bilateral; 05/20/2022: SUBQ ICD IMPLANT; N/A     Comment:  Procedure: SUBQ ICD IMPLANT;  Surgeon: Lanier Prude, MD;  Location: [facility] INVASIVE CV LAB;  Service:               Cardiovascular;  Laterality: N/A;  BMI    Body Mass Index: 34.14 kg/m      Reproductive/Obstetrics negative OB ROS                             Anesthesia Physical Anesthesia Plan  ASA: 3  Anesthesia Plan: General   Post-op Pain Management: Minimal or no pain anticipated   Induction: Intravenous  PONV Risk Score and Plan: Ondansetron and Dexamethasone  Airway Management Planned: Natural Airway  Additional Equipment:   Intra-op Plan:   Post-operative Plan:   Informed Consent: I have reviewed the patients History and Physical, chart, labs and discussed the procedure including the risks, benefits and alternatives for the proposed anesthesia with the patient or authorized representative who has indicated his/her understanding and acceptance.     Dental Advisory Given  Plan Discussed with: Anesthesiologist, CRNA and Surgeon  Anesthesia Plan Comments:        Anesthesia Quick  Evaluation

## 2023-11-06 NOTE — Anesthesia Postprocedure Evaluation (Signed)
 Anesthesia Post Note  Patient: Melik Blancett.  Procedure(s) Performed: CARDIOVERSION  Patient location during evaluation: Specials Recovery Anesthesia Type: General Level of consciousness: awake and alert Pain management: pain level controlled Vital Signs Assessment: post-procedure vital signs reviewed and stable Respiratory status: spontaneous breathing, nonlabored ventilation and respiratory function stable Cardiovascular status: blood pressure returned to baseline and stable Postop Assessment: no apparent nausea or vomiting Anesthetic complications: no   No notable events documented.   Last Vitals:  Vitals:   11/06/23 1300 11/06/23 1315  BP: 101/65 (!) 107/92  Pulse: 83 84  Resp: 15 16  Temp:    SpO2: 98% 96%    Last Pain:  Vitals:   11/06/23 1131  TempSrc: Oral  PainSc: 0-No pain                 Foye Deer

## 2023-11-06 NOTE — Interval H&P Note (Signed)
 History and Physical Interval Note:  11/06/2023 12:36 PM  Willliam Mena Pauls.  has presented today for surgery, with the diagnosis of Atrial fibrillation.  The various methods of treatment have been discussed with the patient and family. After consideration of risks, benefits and other options for treatment, the patient has consented to  Procedure(s): CARDIOVERSION (N/A) as a surgical intervention.  The patient's history has been reviewed, patient examined, no change in status, stable for surgery.  I have reviewed the patient's chart and labs.  Questions were answered to the patient's satisfaction.     Romie Minus

## 2023-11-06 NOTE — Progress Notes (Signed)
    Advanced Heart Failure Rounding Note  PCP-Cardiologist: Julien Nordmann, MD   Subjective:     Successful cardioversion today, patient feeling better.    Objective:   Weight Range: 104.3 kg Body mass index is 32.07 kg/m.   Vital Signs:   Temp:  [97.5 F (36.4 C)-98.9 F (37.2 C)] 98.1 F (36.7 C) (03/20 1131) Pulse Rate:  [83-106] 83 (03/20 1330) Resp:  [14-22] 16 (03/20 1330) BP: (95-117)/(61-93) 105/72 (03/20 1330) SpO2:  [96 %-100 %] 96 % (03/20 1330) FiO2 (%):  [28 %] 28 % (03/19 2204) Last BM Date : 11/04/23  Weight change: Filed Weights   11/03/23 1520  Weight: 104.3 kg    Intake/Output:   Intake/Output Summary (Last 24 hours) at 11/06/2023 1615 Last data filed at 11/06/2023 1504 Gross per 24 hour  Intake 826.54 ml  Output 2810 ml  Net -1983.46 ml      Physical Exam    General: Chronically ill-appearing HEENT: NCAT Cor: Regular rate & rhythm. No rubs, gallops or murmurs. JVP mildly elevated.  No LE edema Lungs: Normal work of breathing, clear to auscultation bilaterally Abdomen: Mildly distended Extremities: no cyanosis, clubbing, rash Neuro: alert & orientedx3, cranial nerves grossly intact. moves all 4 extremities w/o difficulty. Affect pleasant Vascular: 2+ radial pulses    Telemetry   A-fib in the 110s  Patient Profile   Gabriel [name]. is a 53 y.o. male with a PMH of CAD, DM, HTN, HFrEF, PE on xarelto, VT s/p IC who is seen today for evaluation of new atrial fibrillation at the request of Dr. Sherryll Burger.   Assessment/Plan   Acute on chronic systolic heart failure: Volume status improving with IV Lasix, will continue for 2 additional doses today. Will place back on orals tomorrow. -Continue IV Lasix 80 mg twice daily -Continue Farxiga 10 mg daily -Continue spironolactone 25 mg -Management of afib as below -Continue Entresto 24/26 mg twice daily, plan to increase tomorrow   Atrial fibrillation with RVR: Avoid further nodal blocking  agents, BP preserved and rates relatively well controlled.  Successful cardioversion today.  -Continue IV amiodarone, transition to orals tomorrow -Continue xarelto,   CKD: Creatinine improving with diuresis. - Conitnue diuresis as above - Restart SGLT2   CAD: Known from previous cath, poor revascularization targets. - Continue xarelto, fenofibrate, crestor   HTN: Management as above   OSA: Compliant with CPAP   Length of Stay: 3  Romie Minus, MD  11/06/2023, 4:15 PM  Advanced Heart Failure Team Pager [phone] (M-F; 7a - 5p)  Please contact [facility] for night-coverage after hours (5p -7a ) and weekends on [email or web address]

## 2023-11-06 NOTE — Transfer of Care (Signed)
 Immediate Anesthesia Transfer of Care Note  Patient: Gabriel [name].  Procedure(s) Performed: CARDIOVERSION  Patient Location: PACU and Nursing Unit  Anesthesia Type:General  Level of Consciousness: drowsy and patient cooperative  Airway & Oxygen Therapy: Patient Spontanous Breathing and Patient connected to nasal cannula oxygen  Post-op Assessment: Report given to RN and Post -op Vital signs reviewed and stable  Post vital signs: Reviewed and stable  Last Vitals:  Vitals Value Taken Time  BP 95/61 11/06/23 1254  Temp    Pulse    Resp 14 11/06/23 1254  SpO2 98 % 11/06/23 1254    Last Pain:  Vitals:   11/06/23 1131  TempSrc: Oral  PainSc: 0-No pain         Complications: No notable events documented.

## 2023-11-06 NOTE — Progress Notes (Addendum)
 PHARMACY CONSULT NOTE - ELECTROLYTES  Pharmacy Consult for Electrolyte Monitoring and Replacement   Recent Labs: Height: 5\' 11"  (180.3 cm) Weight: 104.3 kg (229 lb 15 oz) IBW/kg (Calculated) : 75.3 Estimated Creatinine Clearance: 86.5 mL/min (by C-G formula based on SCr of 1.2 mg/dL). Potassium (mmol/L)  Date Value  11/06/2023 3.7  10/07/2013 3.8   Magnesium (mg/dL)  Date Value  41/32/4401 1.9   Calcium (mg/dL)  Date Value  02/72/5366 8.9   Calcium, Total (mg/dL)  Date Value  44/10/4740 8.9   Albumin (g/dL)  Date Value  59/56/3875 2.9 (L)  08/09/2022 5.0 (H)  11/12/2012 4.2   Phosphorus (mg/dL)  Date Value  64/33/2951 4.4   Sodium (mmol/L)  Date Value  11/06/2023 135  08/29/2023 139  10/07/2013 139    Assessment  Gabriel [name]. is a 53 y.o. male presenting with dizziness and atrial fibrillation. PMH significant for CAD, non-insulin-dependent diabetes mellitus, hypertension, history of PE on Eliquis, history of nonsustained V. tach status post AICD. Pharmacy has been consulted to monitor and replace electrolytes.  Diet: NPO prior to procedure MIVF: N/A Pertinent medications: spirolactone, amiodarone, furosemide 80mg    Goal of Therapy: Optimize K>4, Mag>2 given A-fib All other electrolytes WNL  Plan:  K 3.7, replace with Kcl po q4hr x 2 doses Mg 1.9, replace with MgSulfate 2gm IV x 1 dose  Renal function panel and Mg with AM labs  Thank you for allowing pharmacy to be a part of this patient's care.  Ladell Lea Rodriguez-Guzman PharmD, BCPS 11/06/2023 7:24 AM

## 2023-11-06 NOTE — Plan of Care (Signed)
  Problem: Coping: Goal: Ability to adjust to condition or change in health will improve Outcome: Progressing   Problem: Fluid Volume: Goal: Ability to maintain a balanced intake and output will improve Outcome: Progressing   Problem: Health Behavior/Discharge Planning: Goal: Ability to manage health-related needs will improve Outcome: Progressing   Problem: Metabolic: Goal: Ability to maintain appropriate glucose levels will improve Outcome: Progressing   Problem: Skin Integrity: Goal: Risk for impaired skin integrity will decrease Outcome: Progressing   Problem: Education: Goal: Knowledge of General Education information will improve Description: Including pain rating scale, medication(s)/side effects and non-pharmacologic comfort measures Outcome: Progressing   Problem: Clinical Measurements: Goal: Will remain free from infection Outcome: Progressing Goal: Respiratory complications will improve Outcome: Progressing Goal: Cardiovascular complication will be avoided Outcome: Progressing   Problem: Nutrition: Goal: Adequate nutrition will be maintained Outcome: Progressing   Problem: Coping: Goal: Level of anxiety will decrease Outcome: Progressing   Problem: Elimination: Goal: Will not experience complications related to bowel motility Outcome: Progressing Goal: Will not experience complications related to urinary retention Outcome: Progressing

## 2023-11-07 ENCOUNTER — Encounter: Payer: Self-pay | Admitting: Cardiology

## 2023-11-07 ENCOUNTER — Other Ambulatory Visit: Payer: Self-pay

## 2023-11-07 DIAGNOSIS — I5023 Acute on chronic systolic (congestive) heart failure: Secondary | ICD-10-CM | POA: Diagnosis not present

## 2023-11-07 DIAGNOSIS — I4891 Unspecified atrial fibrillation: Secondary | ICD-10-CM | POA: Diagnosis not present

## 2023-11-07 LAB — BASIC METABOLIC PANEL
Anion gap: 14 (ref 5–15)
BUN: 36 mg/dL — ABNORMAL HIGH (ref 6–20)
CO2: 28 mmol/L (ref 22–32)
Calcium: 9.2 mg/dL (ref 8.9–10.3)
Chloride: 94 mmol/L — ABNORMAL LOW (ref 98–111)
Creatinine, Ser: 1.56 mg/dL — ABNORMAL HIGH (ref 0.61–1.24)
GFR, Estimated: 52 mL/min — ABNORMAL LOW (ref >60)
Glucose, Bld: 135 mg/dL — ABNORMAL HIGH (ref 70–99)
Potassium: 4 mmol/L (ref 3.5–5.1)
Sodium: 136 mmol/L (ref 135–145)

## 2023-11-07 LAB — GLUCOSE, CAPILLARY: Glucose-Capillary: 113 mg/dL — ABNORMAL HIGH (ref 70–99)

## 2023-11-07 MED ORDER — AMIODARONE HCL 200 MG PO TABS
200.0000 mg | ORAL_TABLET | Freq: Two times a day (BID) | ORAL | Status: DC
Start: 2023-11-21 — End: 2023-11-07

## 2023-11-07 MED ORDER — AMIODARONE HCL 200 MG PO TABS
ORAL_TABLET | ORAL | 0 refills | Status: DC
  Filled 2023-11-07: qty 144, 58d supply, fill #0

## 2023-11-07 MED ORDER — SENNOSIDES-DOCUSATE SODIUM 8.6-50 MG PO TABS: 1.0000 | ORAL_TABLET | Freq: Every evening | ORAL | Status: DC | PRN

## 2023-11-07 MED ORDER — CARVEDILOL 3.125 MG PO TABS
3.1250 mg | ORAL_TABLET | Freq: Two times a day (BID) | ORAL | Status: DC
  Administered 2023-11-07: 3.125 mg via ORAL
  Filled 2023-11-07: qty 1

## 2023-11-07 MED ORDER — GLUCERNA SHAKE PO LIQD: 237.0000 mL | Freq: Three times a day (TID) | ORAL | Status: DC

## 2023-11-07 MED ORDER — SACUBITRIL-VALSARTAN 24-26 MG PO TABS
1.0000 | ORAL_TABLET | Freq: Two times a day (BID) | ORAL | 2 refills | Status: DC
  Filled 2023-11-07: qty 60, 30d supply, fill #0
  Filled 2023-12-02: qty 60, 30d supply, fill #1

## 2023-11-07 MED ORDER — AMIODARONE HCL 200 MG PO TABS: 200.0000 mg | ORAL_TABLET | Freq: Two times a day (BID) | ORAL | Status: DC

## 2023-11-07 MED ORDER — CARVEDILOL 3.125 MG PO TABS
3.1250 mg | ORAL_TABLET | Freq: Two times a day (BID) | ORAL | 2 refills | Status: DC
  Filled 2023-11-07: qty 60, 30d supply, fill #0
  Filled 2023-12-02: qty 60, 30d supply, fill #1

## 2023-11-07 MED ORDER — TORSEMIDE 20 MG PO TABS: 20.0000 mg | ORAL_TABLET | Freq: Every day | ORAL | Status: DC

## 2023-11-07 MED ORDER — AMIODARONE HCL 200 MG PO TABS: 200.0000 mg | ORAL_TABLET | Freq: Every day | ORAL | Status: DC

## 2023-11-07 MED ORDER — AMIODARONE HCL 200 MG PO TABS: 400.0000 mg | ORAL_TABLET | Freq: Two times a day (BID) | ORAL | Status: DC

## 2023-11-07 MED ORDER — AMIODARONE HCL 200 MG PO TABS
400.0000 mg | ORAL_TABLET | Freq: Two times a day (BID) | ORAL | Status: DC
  Administered 2023-11-07: 400 mg via ORAL
  Filled 2023-11-07: qty 2

## 2023-11-07 NOTE — Progress Notes (Addendum)
 PHARMACY CONSULT NOTE - ELECTROLYTES  Pharmacy Consult for Electrolyte Monitoring and Replacement   Recent Labs: Height: 5\' 11"  (180.3 cm) Weight: 104.3 kg (229 lb 15 oz) IBW/kg (Calculated) : 75.3 Estimated Creatinine Clearance: 66.5 mL/min (A) (by C-G formula based on SCr of 1.56 mg/dL (H)). Potassium (mmol/L)  Date Value  11/07/2023 4.0  10/07/2013 3.8   Magnesium (mg/dL)  Date Value  16/05/9603 1.9   Calcium (mg/dL)  Date Value  54/04/8118 9.2   Calcium, Total (mg/dL)  Date Value  14/78/2956 8.9   Albumin (g/dL)  Date Value  21/30/8657 2.9 (L)  08/09/2022 5.0 (H)  11/12/2012 4.2   Phosphorus (mg/dL)  Date Value  84/69/6295 4.4   Sodium (mmol/L)  Date Value  11/07/2023 136  08/29/2023 139  10/07/2013 139    Assessment  Ej Billye Nydam. is a 53 y.o. male presenting with dizziness and atrial fibrillation. PMH significant for CAD, non-insulin-dependent diabetes mellitus, hypertension, history of PE on Eliquis, history of nonsustained V. tach status post AICD. Pharmacy has been consulted to monitor and replace electrolytes.  Diet: regular/fluid/thin MIVF: N/A Pertinent medications: spirolactone, amiodarone, torsemide 20 mg daily, entresto,   Goal of Therapy: Optimize K>4, Mag>2 given A-fib All other electrolytes WNL  Plan:  No replacement needed.  F/u with AM labs. If potassium is trending down a decent while on torsemide (to start 3/22) and spironolactone may need to add on Kcl 20 mEq daily at discharge.   Thank you for allowing pharmacy to be a part of this patient's care.  Paschal Dopp, PharmD, BCPS 11/07/2023 9:54 AM

## 2023-11-07 NOTE — Progress Notes (Addendum)
 Patient Name: Gabriel [name]. Date of Encounter: 11/07/2023  Primary Cardiologist: Julien Nordmann, MD Electrophysiologist: Lanier Prude, MD  Interval Summary   He believes he has maintained sinus rhythm since DCCV yesterday. No further palpitations, chest discomfort, SOB.    Vital Signs    Vitals:   11/06/23 1927 11/06/23 2307 11/07/23 0440 11/07/23 0831  BP: 110/68 117/72 116/69 102/69  Pulse: 88 85 76 74  Resp: 20 20 20    Temp: 98.6 F (37 C) 98.6 F (37 C) 97.9 F (36.6 C) 97.9 F (36.6 C)  TempSrc: Oral Oral    SpO2: 100% 99% 100% 100%  Weight:      Height:        Intake/Output Summary (Last 24 hours) at 11/07/2023 0934 Last data filed at 11/07/2023 2956 Gross per 24 hour  Intake 142.08 ml  Output 1620 ml  Net -1477.92 ml   Filed Weights   11/03/23 1520  Weight: 104.3 kg    Physical Exam    GEN- The patient is well appearing, alert and oriented x 3 today.   Lungs- Clear to ausculation bilaterally, normal work of breathing Cardiac- Regular rate and rhythm, no murmurs, rubs or gallops GI- soft, NT, ND, + BS Extremities- no clubbing or cyanosis. No edema  Telemetry    SR 60-80s (personally reviewed)  Hospital Course    Gabriel [name]. is a 53 y.o. 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, OSA on CPAP, T2DM  admitted for AFib w RVR  Assessment & Plan    #) AFib w RVR Relatively new diagnosis On xarelto for h/o PE no missed doses AAD options quite limited d/t history of CAD and prolonged QTC  S/p DCCV yesterday, maintaining sinus rhythm since  Transition to PO amiodarone 400mg  amiodarone BID x 2 weeks, then 400mg  daily x 2 weeks, then 200mg  daily Follow-up in 6 weeks in clinic    #) Osa on CPAP Continue nightly use    #) HFmrEF HF team is on board, managing GDMT and diuretics   EP Lubertha Leite sign off at this time, ok to discharge from EP perspective      For questions or updates, please contact [facility] Please consult [email or web address] for contact info under Cardiology/STEMI.  Signed, Sherie Don, NP  11/07/2023, 9:34 AM   I have seen and examined this patient with Sherie Don.  Agree with above, note added to reflect my findings.  Patient is now post cardioversion.  He is in sinus rhythm.  He is feeling well with significantly less fatigue or shortness of breath.  GEN: Well nourished, well developed, in no acute distress  HEENT: normal  Neck: no JVD Respiratory: Normal work of breathing MS: no deformity or atrophy  Skin: warm and dry Neuro:  Strength and sensation are grossly intact Psych: euthymic mood, full affect   Persistent atrial fibrillation: Patient is post cardioversion.  Is currently on amiodarone IV.  Gabriel [name] transition to p.o. amiodarone at discharge with the above schedule.  He Gabriel [name] follow-up in clinic in 6 weeks to discuss further rhythm control management. Secondary hypercoagulable state: Continue Xarelto for atrial fibrillation and PE Obstructive sleep apnea: Nightly CPAP encouraged Chronic systolic heart failure: Medical management per heart failure cardiology  Eloy Fehl M. Arelene Moroni MD 11/07/2023 2:27 PM  I have seen the patient via virtual consultation today.  The patient expressed their consent to participate in today's virtual consult. The patient was located at [facility] while I was located at [facility].  I have examined the patient and reviewed the above assessment and plan.     -----------------     Caregility was used to complete today's consult.     Total time of encounter: 35 minutes total time of encounter, including face-to-face patient care, coordination of care and counseling regarding high complexity medical decision making re: atrial fibrillation and medication management.   Z6109 - ER or Initial Inpatient 30 min U0454 - ER or Initial Inpatient 50 min G0427 - ER or Initial Inpatient 70 min  G0406 -  Follow up inpatient consult limited 15 min G0407 - Follow up inpatient consult intermediate 25 min G0408 - Follow up inpatient consult complex 35 min  -GT modifier - via interactive audio and video telecommunication systems     Yul Diana Jorja Loa, MD 11/07/2023 2:27 PM

## 2023-11-07 NOTE — Plan of Care (Signed)

## 2023-11-07 NOTE — Progress Notes (Signed)
    Advanced Heart Failure Rounding Note  PCP-Cardiologist: Julien Nordmann, MD   Subjective:     Feeling much better this morning, remains in sinus rhythm. Plan for discharge today. EP to place amiodarone recs. Will restart diuretics tomorrow given mild bump in creatinine.    Objective:   Weight Range: 104.3 kg Body mass index is 32.07 kg/m.   Vital Signs:   Temp:  [97.8 F (36.6 C)-98.6 F (37 C)] 97.9 F (36.6 C) (03/21 0831) Pulse Rate:  [74-100] 74 (03/21 0831) Resp:  [14-22] 20 (03/21 0440) BP: (95-117)/(61-93) 102/69 (03/21 0831) SpO2:  [96 %-100 %] 100 % (03/21 0831) Last BM Date : 11/06/23  Weight change: Filed Weights   11/03/23 1520  Weight: 104.3 kg    Intake/Output:   Intake/Output Summary (Last 24 hours) at 11/07/2023 0953 Last data filed at 11/07/2023 4782 Gross per 24 hour  Intake 142.08 ml  Output 1620 ml  Net -1477.92 ml      Physical Exam    General: Chronically ill-appearing Cor: Regular rate & rhythm. No rubs, gallops or murmurs. JVP mildly elevated.  No LE edema Lungs: Normal work of breathing, clear to auscultation bilaterally Abdomen: Mildly distended Extremities: no cyanosis, clubbing, rash Neuro: alert & orientedx3, cranial nerves grossly intact. moves all 4 extremities w/o difficulty. Affect pleasant Vascular: 2+ radial pulses    Telemetry   A-fib in the 110s  Patient Profile   Gabriel [name]. is a 53 y.o. male with a PMH of CAD, DM, HTN, HFrEF, PE on xarelto, VT s/p IC who is seen today for evaluation of new atrial fibrillation at the request of Dr. Sherryll Burger.   Assessment/Plan   Acute on chronic systolic heart failure: Volume status improving with IV Lasix, will continue for 2 additional doses today. Mild bump in creatinine. Otherwise doing well, will discharge on reduced dose GDMT, can hopefully increase in clinic.  - Restart carvedilol 3.125mg  BID -Continue Farxiga 10 mg daily -Continue spironolactone 25 mg - Restart  toresemide 20mg  daily tomorrow -Continue Entresto 24/26 mg twice daily hopefully increase at discharge   Atrial fibrillation with RVR: Back in NSR. Successful cardioversion 3/20. -Oral amiodarone load per EP, restart BB -Continue xarelto,   CKD: Stage IIIa, mild bump today. Restart diuretics tomorrow.  - Conitnue diuresis as above - Continue SGLT2   CAD: Known from previous cath, poor revascularization targets. - Continue xarelto, fenofibrate, crestor   HTN: Management as above   OSA: Compliant with CPAP   Length of Stay: 4  Romie Minus, MD  11/07/2023, 9:53 AM  Advanced Heart Failure Team Pager [phone] (M-F; 7a - 5p)  Please contact [facility] for night-coverage after hours (5p -7a ) and weekends on [email or web address]

## 2023-11-10 ENCOUNTER — Telehealth: Payer: Self-pay

## 2023-11-10 DIAGNOSIS — M25512 Pain in left shoulder: Secondary | ICD-10-CM | POA: Diagnosis not present

## 2023-11-10 DIAGNOSIS — G8929 Other chronic pain: Secondary | ICD-10-CM | POA: Diagnosis not present

## 2023-11-10 NOTE — Discharge Summary (Signed)
 Physician Discharge Summary   Patient: Gabriel [name]. MRN: [id number] DOB: [birth year]  Admit date:     11/03/2023  Discharge date: 11/07/2023  Discharge Physician: Pennie Banter   PCP: Sallee Provencal, FNP   Recommendations at discharge:    Follow up with Cardiology Follow up with Primary Care Repeat CBC, BMP, Mg at follow up  Discharge Diagnoses: Principal Problem:   Atrial fibrillation with RVR (HCC) Active Problems:   Chronic obstructive pulmonary disease (COPD) (HCC)   NICM (nonischemic cardiomyopathy) (HCC)   Type 2 diabetes mellitus with hyperlipidemia (HCC)   Type II diabetes mellitus with renal manifestations (HCC)   OSA on CPAP   Depression, recurrent (HCC)  Resolved Problems:   * No resolved hospital problems. *  Hospital Course: "53 year old male with history of CAD, non-insulin-dependent diabetes mellitus, hypertension, history of PE on Eliquis, history of nonsustained V. tach status post AICD, who presents emergency department for chief concerns of dizziness and atrial fibrillation    3/18: Advanced CHF team consult/evaluation"  I assumed care 3/19.  Patient underwent successful cardioversion on 3/20.  3/21 -- patient cleared by cardiology and medically stable, requesting discharge home today.    Assessment and Plan:  * Atrial fibrillation with RVR (HCC) Acute on chronic systolic heart failure:  Echo 6/21 -- EF 35-40%, global LV hypokinesis, severely dilated LV internal cavity size, mild LVH, small pericardial effusion, mild MR Normal TSH Continue Farxiga, Aldactone Torsemide 20 mg daily starting tomorrow Entresto 24/26 PO BID Xarelto for stroke prevention Advanced Heart Failure Cardiology was consulted Status post IV Lasix and started amiodarone  Underwent successful cardioversion 3/20, maintaining sinus rhythm Outpatient Cardiology follow up Daily weights at home   Depression, recurrent (HCC) Home fluoxetine 20 mg every evening  resumed Bupropion 150 mg daily resumed   Type II diabetes mellitus with renal manifestations (HCC) Home long-acting insulin 36 units nightly resumed Insulin SSI with at bedtime coverage ordered Home Ozempic will not be resumed on admission   Type 2 diabetes mellitus with hyperlipidemia (HCC) Home fenofibrate equivalent resumed on admission Rosuvastatin 20 mg nightly resumed   History of pulmonary embolism Continue Xarelto   OSA on CPAP CPAP nightly    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 Complicates overall care and prognosis.  Recommend lifestyle modifications including physical activity and diet for weight loss and overall long-term health.       Consultants: advanced heart failure / cardiology Procedures performed: TEE/cardioversion  Disposition: Home Diet recommendation:  Discharge Diet Orders (From admission, onward)     Start     Ordered   11/07/23 0000  Diet - low sodium heart healthy        11/07/23 1058            DISCHARGE MEDICATION: Allergies as of 11/07/2023       Reactions   Metformin And Related Nausea And Vomiting   Zantac [ranitidine Hcl] Diarrhea, Nausea Only   Night sweats        Medication List     STOP taking these medications    Entresto 97-103 MG Generic drug: sacubitril-valsartan Replaced by: Sherryll Burger 24-26 MG   naltrexone 50 MG tablet Commonly known as: DEPADE       TAKE these medications    Accu-Chek Softclix Lancets lancets USE UP TO FOUR TIMES DAILY   albuterol 108 (90 Base) MCG/ACT inhaler Commonly known as: VENTOLIN HFA Inhale 2  puffs into the lungs every 4 (four) hours as needed for wheezing or shortness of breath.   allopurinol 300 MG tablet Commonly known as: ZYLOPRIM Take 300 mg by mouth every morning.   amiodarone 200 MG tablet Commonly known as: Pacerone Take 2 tablets (400 mg total) by mouth 2 (two) times daily for 14 days, THEN 1 tablet (200 mg  total) 2 (two) times daily for 14 days, THEN 1 tablet (200 mg total) 2 (two) times daily. Start taking on: November 07, 2023   Anoro Ellipta 62.5-25 MCG/ACT Aepb Generic drug: umeclidinium-vilanterol Inhale 1 puff into the lungs every morning.   aspirin EC 81 MG tablet Take 81 mg by mouth every morning.   azelastine 0.1 % nasal spray Commonly known as: ASTELIN Place 2 sprays into both nostrils 2 (two) times daily. Use in each nostril as directed   Basaglar KwikPen 100 UNIT/ML Inject 36 Units into the skin at bedtime.   BD Pen Needle Nano 2nd Gen 32G X 4 MM Misc Generic drug: Insulin Pen Needle USE  AS DIRECTED AT BEDTIME   Insulin Pen Needle 32G X 4 MM Misc 1 each by Does not apply route in the morning, at noon, in the evening, and at bedtime.   blood glucose meter kit and supplies Kit Dispense based on patient and insurance preference. Use up to four times daily as directed.   buPROPion 150 MG 24 hr tablet Commonly known as: WELLBUTRIN XL Take 150 mg by mouth daily.   butalbital-acetaminophen-caffeine 50-325-40 MG tablet Commonly known as: FIORICET Take 1 tablet by mouth every 4 (four) hours as needed for migraine.   carvedilol 3.125 MG tablet Commonly known as: COREG Take 1 tablet (3.125 mg total) by mouth 2 (two) times daily with a meal. What changed:  medication strength how much to take when to take this   cetirizine 10 MG tablet Commonly known as: ZYRTEC Take 1 tablet (10 mg total) by mouth every morning.   colchicine 0.6 MG tablet Take 1 tablet (0.6 mg total) by mouth daily as needed (gout flare).   dapagliflozin propanediol 10 MG Tabs tablet Commonly known as: FARXIGA Take 1 tablet (10 mg total) by mouth daily.   Entresto 24-26 MG Generic drug: sacubitril-valsartan Take 1 tablet by mouth 2 (two) times daily. Replaces: Entresto 97-103 MG   feeding supplement (GLUCERNA SHAKE) Liqd Take 237 mLs by mouth 3 (three) times daily between meals.   fenofibrate  145 MG tablet Commonly known as: TRICOR Take 1 tablet (145 mg total) by mouth every evening.   FLUoxetine 20 MG capsule Commonly known as: PROZAC Take 1 capsule (20 mg total) by mouth every evening.   fluticasone 50 MCG/ACT nasal spray Commonly known as: FLONASE Place 2 sprays into both nostrils daily as needed for allergies or rhinitis. Home med.   FreeStyle Libre 3 Sensor Misc PLACE 1 SENSOR ON TO THE SKIN EVERY 14 DAYS. USE TO CHECK BLOOD SUGAR CONTINUEOUSLY.   FreeStyle Libre 3 Sensor Misc 1 Application by Does not apply route every 14 (fourteen) days. Use to check blood sugar continuously.   FreeStyle Libre 3 Sensor Misc Apply sensor every 14 (fourteen) days to check blood sugar continuously.   Galcanezumab-gnlm 120 MG/ML Soaj Inject 120 mg into the skin. Monthly   insulin lispro 100 UNIT/ML injection Commonly known as: HUMALOG Inject 10 Units into the skin 3 (three) times daily before meals.   ipratropium-albuterol 0.5-2.5 (3) MG/3ML Soln Commonly known as: DUONEB Take 3 mLs by nebulization  every 6 (six) hours as needed.   isosorbide mononitrate 30 MG 24 hr tablet Commonly known as: IMDUR Take 0.5 tablets (15 mg total) by mouth every morning.   meclizine 25 MG tablet Commonly known as: ANTIVERT Take 0.5 tablets (12.5 mg total) by mouth 3 (three) times daily as needed for dizziness.   metoCLOPramide 5 MG tablet Commonly known as: Reglan Take 1 tablet (5 mg total) by mouth every 8 (eight) hours as needed for nausea.   nitroGLYCERIN 0.4 MG SL tablet Commonly known as: NITROSTAT Place 1 tablet (0.4 mg total) under the tongue every 5 (five) minutes x 3 doses as needed for chest pain.   omeprazole 40 MG capsule Commonly known as: PRILOSEC Take 1 capsule (40 mg total) by mouth every morning.   oxyCODONE 5 MG immediate release tablet Commonly known as: Roxicodone Take 1-2 tablets (5-10 mg total) by mouth every 4 (four) hours as needed for moderate pain (pain score  4-6) or severe pain (pain score 7-10).   Ozempic (2 MG/DOSE) 8 MG/3ML Sopn Generic drug: Semaglutide (2 MG/DOSE) Inject 2 mg into the skin once a week. Tuesdays   potassium chloride SA 20 MEQ tablet Commonly known as: KLOR-CON M Take 1 tablet (20 mEq total) by mouth every other day. AM   Rezdiffra 100 MG Tabs Generic drug: Resmetirom TAKE 1 TABLET BY MOUTH DAILY WITH OR WITHOUT FOOD   rivaroxaban 20 MG Tabs tablet Commonly known as: XARELTO Take 1 tablet (20 mg total) by mouth daily with supper.   rosuvastatin 20 MG tablet Commonly known as: Crestor Take 1 tablet (20 mg total) by mouth at bedtime.   senna-docusate 8.6-50 MG tablet Commonly known as: Senokot-S Take 1 tablet by mouth at bedtime as needed for mild constipation.   spironolactone 25 MG tablet Commonly known as: ALDACTONE Take 1 tablet by mouth once daily   Taltz 80 MG/ML pen Generic drug: ixekizumab Inject 3 mLs into the skin every 28 (twenty-eight) days.   torsemide 20 MG tablet Commonly known as: DEMADEX Take 1 tablet (20 mg total) by mouth every morning.        Follow-up Information     [facility]. Go on 11/13/2023.   Specialty: Cardiology Why: Hospital Follow-Up 11/13/23 @ 2:30PM Please bring all medications to follow-up appointment [facility] [address] Free Valet Parking at the door. Contact information: [address] [place] [address] [phone]               Discharge Exam: Ceasar Mons Weights   11/03/23 1520  Weight: 104.3 kg   General exam: awake, alert, no acute distress HEENT: moist mucus membranes, hearing grossly normal  Respiratory system: on room air, normal respiratory effort, lungs clear. Cardiovascular system: normal S1/S2, RRR, no pedal edema.   Gastrointestinal system: soft, NT, ND Central nervous system: A&O x3. no gross focal neurologic deficits, normal  speech Extremities: moves all, no edema, normal tone Skin: dry, intact, normal temperature Psychiatry: normal mood, congruent affect, judgement and insight appear normal   Condition at discharge: stable  The results of significant diagnostics from this hospitalization (including imaging, microbiology, ancillary and laboratory) are listed below for reference.   Imaging Studies: ECHOCARDIOGRAM COMPLETE Result Date: 11/04/2023    ECHOCARDIOGRAM REPORT   Patient Name:   Lawyer Washabaugh. Date of Exam: 11/04/2023 Medical Rec #:  [id number]           Height:  71.0 in Accession #:    [id number]          Weight:       229.9 lb Date of Birth:  1970-09-21           BSA:          2.237 m Patient Age:    52 years            BP:           116/84 mmHg Patient Gender: M                   HR:           119 bpm. Exam Location:  [facility] Procedure: 2D Echo, Cardiac Doppler, Color Doppler and Intracardiac            Opacification Agent (Both Spectral and Color Flow Doppler were            utilized during procedure). Indications:     Atrial Fibnllation  History:         Patient has prior history of Echocardiogram examinations, most                  recent 03/10/2023. CHF and Cardiomyopathy, Defibrillator, COPD,                  Arrythmias:Atrial Fibrillation, Signs/Symptoms:Chest Pain and                  Dyspnea; Risk Factors:Dyslipidemia, Diabetes and Sleep Apnea.  Sonographer:     Mikki Harbor Referring Phys:  [id number] AMY N COX Diagnosing Phys: Yvonne Kendall MD  Sonographer Comments: Technically difficult study due to poor echo windows, suboptimal subcostal window and patient is obese. Image acquisition challenging due to COPD. IMPRESSIONS  1. Left ventricular ejection fraction, by estimation, is 35 to 40%. The left ventricle has moderately decreased function. The left ventricle demonstrates global hypokinesis. The left ventricular internal cavity size was severely dilated. There is mild left ventricular  hypertrophy. Left ventricular diastolic function could not be evaluated.  2. Right ventricular systolic function is mildly reduced. The right ventricular size is mildly enlarged.  3. Left atrial size was moderately dilated.  4. Right atrial size was mildly dilated.  5. A small pericardial effusion is present. The pericardial effusion is posterior to the left ventricle.  6. The mitral valve is degenerative. Mild mitral valve regurgitation.  7. The aortic valve is tricuspid. There is mild calcification of the aortic valve. There is mild thickening of the aortic valve. Aortic valve regurgitation is at least moderate but may be underestimated by jet eccentricity. Aortic valve sclerosis/calcification is present, without any evidence of aortic stenosis.  8. Aortic dilatation noted. There is mild dilatation of the aortic root, measuring 43 mm. Conclusion(s)/Recommendation(s): No left ventricular mural or apical thrombus/thrombi. FINDINGS  Left Ventricle: Left ventricular ejection fraction, by estimation, is 35 to 40%. The left ventricle has moderately decreased function. The left ventricle demonstrates global hypokinesis. Definity contrast agent was given IV to delineate the left ventricular endocardial borders. The left ventricular internal cavity size was severely dilated. There is mild left ventricular hypertrophy. Left ventricular diastolic function could not be evaluated due to atrial fibrillation. Left ventricular diastolic  function could not be evaluated. Right Ventricle: The right ventricular size is mildly enlarged. No increase in right ventricular wall thickness. Right ventricular systolic function is mildly reduced. Left Atrium: Left atrial size was moderately dilated. Right Atrium: Right atrial size was mildly  dilated. Pericardium: A small pericardial effusion is present. The pericardial effusion is posterior to the left ventricle. Presence of epicardial fat layer. Mitral Valve: The mitral valve is  degenerative in appearance. Mild mitral valve regurgitation. MV peak gradient, 4.8 mmHg. The mean mitral valve gradient is 2.0 mmHg. Tricuspid Valve: The tricuspid valve is grossly normal. Tricuspid valve regurgitation is not demonstrated. Aortic Valve: The aortic valve is tricuspid. There is mild calcification of the aortic valve. There is mild thickening of the aortic valve. Aortic valve regurgitation is at least moderate but may be underestimated by jet eccentricity. Aortic valve sclerosis/calcification is present, without any evidence of aortic stenosis. Aortic valve mean gradient measures 3.5 mmHg. Aortic valve peak gradient measures 5.3 mmHg. Aortic valve area, by VTI measures 3.18 cm. Pulmonic Valve: The pulmonic valve was not well visualized. Pulmonic valve regurgitation is not visualized. No evidence of pulmonic stenosis. Aorta: Aortic dilatation noted. There is mild dilatation of the aortic root, measuring 43 mm. Venous: The inferior vena cava was not well visualized. IAS/Shunts: The interatrial septum was not well visualized.  LEFT VENTRICLE PLAX 2D LVIDd:         6.50 cm LVIDs:         5.40 cm LV PW:         1.20 cm LV IVS:        1.30 cm LVOT diam:     2.30 cm LV SV:         69 LV SV Index:   31 LVOT Area:     4.15 cm  RIGHT VENTRICLE RV Basal diam:  4.15 cm RV Mid diam:    3.90 cm RV S prime:     9.25 cm/s LEFT ATRIUM            Index        RIGHT ATRIUM           Index LA diam:      4.60 cm  2.06 cm/m   RA Area:     21.20 cm LA Vol (A2C): 92.2 ml  41.21 ml/m  RA Volume:   67.20 ml  30.04 ml/m LA Vol (A4C): 130.0 ml 58.10 ml/m  AORTIC VALVE AV Area (Vmax):    3.22 cm AV Area (Vmean):   2.91 cm AV Area (VTI):     3.18 cm AV Vmax:           115.50 cm/s AV Vmean:          87.500 cm/s AV VTI:            0.218 m AV Peak Grad:      5.3 mmHg AV Mean Grad:      3.5 mmHg LVOT Vmax:         89.60 cm/s LVOT Vmean:        61.300 cm/s LVOT VTI:          0.167 m LVOT/AV VTI ratio: 0.77  AORTA Ao Root diam:  4.30 cm Ao Asc diam:  3.20 cm MITRAL VALVE MV Area (PHT): 8.16 cm    SHUNTS MV Area VTI:   3.69 cm    Systemic VTI:  0.17 m MV Peak grad:  4.8 mmHg    Systemic Diam: 2.30 cm MV Mean grad:  2.0 mmHg MV Vmax:       1.10 m/s MV Vmean:      64.1 cm/s MV Decel Time: 93 msec MV E velocity: 84.40 cm/s Yvonne Kendall MD Electronically signed by Yvonne Kendall  MD Signature Date/Time: 11/04/2023/11:53:09 AM    Final    CT Angio Chest PE W/Cm &/Or Wo Cm Result Date: 11/03/2023 CLINICAL DATA:  Atrial fibrillation, tachycardia, short of breath, hypotension, chest pain EXAM: CT ANGIOGRAPHY CHEST WITH CONTRAST TECHNIQUE: Multidetector CT imaging of the chest was performed using the standard protocol during bolus administration of intravenous contrast. Multiplanar CT image reconstructions and MIPs were obtained to evaluate the vascular anatomy. RADIATION DOSE REDUCTION: This exam was performed according to the departmental dose-optimization program which includes automated exposure control, adjustment of the mA and/or kV according to patient size and/or use of iterative reconstruction technique. CONTRAST:  75mL OMNIPAQUE IOHEXOL 350 MG/ML SOLN COMPARISON:  07/22/2023 FINDINGS: Cardiovascular: This is a technically adequate evaluation of the pulmonary vasculature. No filling defects or pulmonary emboli. Mild cardiomegaly without pericardial effusion. No evidence of thoracic aortic aneurysm or dissection. Atherosclerosis of the aorta and coronary vasculature. Mediastinum/Nodes: No enlarged mediastinal, hilar, or axillary lymph nodes. Thyroid gland, trachea, and esophagus demonstrate no significant findings. Lungs/Pleura: No acute airspace disease, effusion, or pneumothorax. Central airways are patent. There is a 5 mm left upper lobe pulmonary nodule reference image 60/6, stable. Upper Abdomen: No acute abnormality. Musculoskeletal: Defibrillator is seen within the left lateral chest wall, lead within the anterior chest wall  along the left margin of the sternum. There is a significant left shoulder effusion, with bony destruction along the superolateral margin of the humeral head. Please correlate with any prior history of shoulder trauma as this could reflect healing fracture and posttraumatic effusion. Otherwise, underlying septic arthritis and osteomyelitis could be considered. No other acute bony abnormalities. The reconstructed images demonstrate no additional findings. Review of the MIP images confirms the above findings. IMPRESSION: 1. No evidence of pulmonary embolus. 2. Significant left shoulder effusion with bony destruction along the superolateral margin of the left humeral head. Please correlate with any history of recent trauma, otherwise septic arthritis and osteomyelitis should be considered. 3. Cardiomegaly without pericardial effusion. 4. Aortic Atherosclerosis (ICD10-I70.0). Coronary artery atherosclerosis. Electronically Signed   By: Sharlet Salina M.D.   On: 11/03/2023 15:53   DG Chest 2 View Result Date: 11/03/2023 CLINICAL DATA:  Chest pain EXAM: CHEST - 2 VIEW COMPARISON:  10/22/2023 FINDINGS: External defibrillator lead overlies the anterior left chest. Stable heart size. Mildly prominent bibasilar interstitial markings. Lungs appear otherwise clear. No pleural effusion or pneumothorax. IMPRESSION: Mildly prominent bibasilar interstitial markings, which may reflect bronchitic type lung changes. Electronically Signed   By: Duanne Guess D.O.   On: 11/03/2023 11:46   DG Chest 2 View Result Date: 10/22/2023 CLINICAL DATA:  Cough, shortness of breath. EXAM: CHEST - 2 VIEW COMPARISON:  July 22, 2023. FINDINGS: Stable cardiomediastinal silhouette. Both lungs are clear. The visualized skeletal structures are unremarkable. IMPRESSION: No active cardiopulmonary disease. Electronically Signed   By: Lupita Raider M.D.   On: 10/22/2023 14:12   LONG TERM MONITOR (3-14 DAYS) Result Date: 10/21/2023 HR 56 to 214,  average 89. 988 VT episodes, longest 55.1 seconds with an average rate of 100 bpm. 46 SVT episodes, longest 10.7 seconds with an average rate of 179 bpm. Occasional supraventricular ectopy, 1.3% Occasional ventricular ectopy, 2.5% Sheria Lang T. Lalla Brothers, MD, [facility], [facility] Cardiac Electrophysiology   Microbiology: Results for orders placed or performed during the hospital encounter of 11/03/23  Resp panel by RT-PCR (RSV, Flu A&B, Covid) Anterior Nasal Swab     Status: None   Collection Time: 11/03/23 10:49 AM   Specimen: Anterior Nasal  Swab  Result Value Ref Range Status   SARS Coronavirus 2 by RT PCR NEGATIVE NEGATIVE Final    Comment: (NOTE) SARS-CoV-2 target nucleic acids are NOT DETECTED.  The SARS-CoV-2 RNA is generally detectable in upper respiratory specimens during the acute phase of infection. The lowest concentration of SARS-CoV-2 viral copies this assay can detect is 138 copies/mL. A negative result does not preclude SARS-Cov-2 infection and should not be used as the sole basis for treatment or other patient management decisions. A negative result may occur with  improper specimen collection/handling, submission of specimen other than nasopharyngeal swab, presence of viral mutation(s) within the areas targeted by this assay, and inadequate number of viral copies(<138 copies/mL). A negative result must be combined with clinical observations, patient history, and epidemiological information. The expected result is Negative.  Fact Sheet for Patients:  BloggerCourse.com  Fact Sheet for Healthcare Providers:  SeriousBroker.it  This test is no t yet approved or cleared by the [place] FDA and  has been authorized for detection and/or diagnosis of SARS-CoV-2 by FDA under an Emergency Use Authorization (EUA). This EUA will remain  in effect (meaning this test can be used) for the duration of the COVID-19 declaration under Section  564(b)(1) of the Act, 21 U.S.C.section 360bbb-3(b)(1), unless the authorization is terminated  or revoked sooner.       Influenza A by PCR NEGATIVE NEGATIVE Final   Influenza B by PCR NEGATIVE NEGATIVE Final    Comment: (NOTE) The Xpert Xpress SARS-CoV-2/FLU/RSV plus assay is intended as an aid in the diagnosis of influenza from Nasopharyngeal swab specimens and should not be used as a sole basis for treatment. Nasal washings and aspirates are unacceptable for Xpert Xpress SARS-CoV-2/FLU/RSV testing.  Fact Sheet for Patients: BloggerCourse.com  Fact Sheet for Healthcare Providers: SeriousBroker.it  This test is not yet approved or cleared by the [place] FDA and has been authorized for detection and/or diagnosis of SARS-CoV-2 by FDA under an Emergency Use Authorization (EUA). This EUA will remain in effect (meaning this test can be used) for the duration of the COVID-19 declaration under Section 564(b)(1) of the Act, 21 U.S.C. section 360bbb-3(b)(1), unless the authorization is terminated or revoked.     Resp Syncytial Virus by PCR NEGATIVE NEGATIVE Final    Comment: (NOTE) Fact Sheet for Patients: BloggerCourse.com  Fact Sheet for Healthcare Providers: SeriousBroker.it  This test is not yet approved or cleared by the [place] FDA and has been authorized for detection and/or diagnosis of SARS-CoV-2 by FDA under an Emergency Use Authorization (EUA). This EUA will remain in effect (meaning this test can be used) for the duration of the COVID-19 declaration under Section 564(b)(1) of the Act, 21 U.S.C. section 360bbb-3(b)(1), unless the authorization is terminated or revoked.  Performed at [facility], [address] [place], [place] [address]    *Note: Due to a large number of results and/or encounters for the requested time period, some results have  not been displayed. A complete set of results can be found in Results Review.    Labs: CBC: Recent Labs  Lab 11/04/23 0605 11/05/23 0433  WBC 8.2 8.6  HGB 12.9* 12.9*  HCT 39.9 39.4  MCV 79.6* 78.3*  PLT 281 256   Basic Metabolic Panel: Recent Labs  Lab 11/04/23 0605 11/05/23 0433 11/06/23 0523 11/07/23 0647  NA 133* 133* 135 136  K 3.4* 3.8 3.7 4.0  CL 103 100 96* 94*  CO2 22 23 26 28   GLUCOSE 104*  109* 121* 135*  BUN 23* 25* 29* 36*  CREATININE 1.20 1.11 1.20 1.56*  CALCIUM 8.3* 8.9 8.9 9.2  MG  --   --  1.9  --   PHOS  --   --  4.4  --    Liver Function Tests: Recent Labs  Lab 11/06/23 0523  ALBUMIN 2.9*   CBG: Recent Labs  Lab 11/06/23 0814 11/06/23 1135 11/06/23 1628 11/06/23 2117 11/07/23 0833  GLUCAP 128* 112* 126* 106* 113*    Discharge time spent: less than 30 minutes.  Signed: Pennie Banter, DO [facility] 11/10/2023

## 2023-11-10 NOTE — Transitions of Care (Post Inpatient/ED Visit) (Signed)
 11/10/2023  Name: Gabriel [name]. MRN: [id number] DOB: 03/31/1971  Today's TOC FU Call Status: Today's TOC FU Call Status:: Successful TOC FU Call Completed TOC FU Call Complete Date: 11/10/23 Patient's Name and Date of Birth confirmed.  Transition Care Management Follow-up Telephone Call Date of Discharge: 11/07/23 Discharge Facility: [facility]) Type of Discharge: Inpatient Admission Primary Inpatient Discharge Diagnosis:: A-fib How have you been since you were released from the hospital?: Better Any questions or concerns?: No  Items Reviewed: Did you receive and understand the discharge instructions provided?: Yes Medications obtained,verified, and reconciled?: Yes (Medications Reviewed) Any new allergies since your discharge?: No Dietary orders reviewed?: Yes Type of Diet Ordered:: Low sodium Heart Healthy Do you have support at home?: Yes People in Home: parent(s) Name of Support/Comfort Primary Source: Drayk Humbarger  Medications Reviewed Today: Medications Reviewed Today     Reviewed by Redge Gainer, RN (Case Manager) on 11/10/23 at 1011  Med List Status: <None>   Medication Order Taking? Sig Documenting Provider Last Dose Status Informant  Accu-Chek Softclix Lancets lancets [id number] No USE UP TO FOUR TIMES DAILY [provider] Taking Active Self  albuterol (VENTOLIN HFA) 108 (90 Base) MCG/ACT inhaler [id number] No Inhale 2 puffs into the lungs every 4 (four) hours as needed for wheezing or shortness of breath. Raechel Chute, MD Taking Active Self  allopurinol (ZYLOPRIM) 300 MG tablet [id number] No Take 300 mg by mouth every morning. [provider] 11/02/2023 Active Self  amiodarone (PACERONE) 200 MG tablet [id number]  Take 2 tablets (400 mg total) by mouth 2 (two) times daily for 14 days, THEN 1 tablet (200 mg total) 2 (two) times daily for 14 days, THEN 1 tablet (200 mg total) 2 (two) times daily. Esaw Grandchild A,  DO  Active   aspirin EC 81 MG tablet [id number] No Take 81 mg by mouth every morning. [provider] 11/02/2023 Active Self  azelastine (ASTELIN) 0.1 % nasal spray [id number] No Place 2 sprays into both nostrils 2 (two) times daily. Use in each nostril as directed Sallee Provencal, FNP Taking Active Self  BD PEN NEEDLE NANO 2ND GEN 32G X 4 MM MISC [id number] No USE  AS DIRECTED AT BEDTIME Jacky Kindle, FNP Taking Active Self  blood glucose meter kit and supplies KIT [id number] No Dispense based on patient and insurance preference. Use up to four times daily as directed. Jacky Kindle, FNP Taking Active Self  buPROPion (WELLBUTRIN XL) 150 MG 24 hr tablet [id number] No Take 150 mg by mouth daily. [provider] 11/02/2023 Active Self  butalbital-acetaminophen-caffeine (FIORICET) 50-325-40 MG tablet [id number] No Take 1 tablet by mouth every 4 (four) hours as needed for migraine.  Patient not taking: Reported on 11/03/2023   [provider] Not Taking Active Self  carvedilol (COREG) 3.125 MG tablet [id number]  Take 1 tablet (3.125 mg total) by mouth 2 (two) times daily with a meal. Pennie Banter, DO  Active   cetirizine (ZYRTEC) 10 MG tablet [id number] No Take 1 tablet (10 mg total) by mouth every morning. Christena Flake, MD 11/02/2023 Active Self  colchicine 0.6 MG tablet [id number] No Take 1 tablet (0.6 mg total) by mouth daily as needed (gout flare). Poggi, Excell Seltzer, MD Taking Active Self  Continuous Glucose Sensor (FREESTYLE LIBRE 3 SENSOR) [place] [id number] No PLACE 1 SENSOR ON TO THE SKIN EVERY 14 DAYS. USE TO CHECK BLOOD SUGAR CONTINUEOUSLY. [provider] Taking Active Self  Continuous  Glucose Sensor (FREESTYLE LIBRE 3 SENSOR) MISC [id number] No 1 Application by Does not apply route every 14 (fourteen) days. Use to check blood sugar continuously. Jacky Kindle, FNP Taking Active Self  Continuous Glucose Sensor (FREESTYLE LIBRE 3 SENSOR) [place] [id number] No Apply sensor  every 14 (fourteen) days to check blood sugar continuously. Sallee Provencal, FNP Taking Active Self  dapagliflozin propanediol (FARXIGA) 10 MG TABS tablet [id number] No Take 1 tablet (10 mg total) by mouth daily. Delma Freeze, [place] 11/02/2023 Active Self  feeding supplement, GLUCERNA SHAKE, (GLUCERNA SHAKE) LIQD [id number]  Take 237 mLs by mouth 3 (three) times daily between meals. Pennie Banter, DO  Active   fenofibrate (TRICOR) 145 MG tablet [id number] No Take 1 tablet (145 mg total) by mouth every evening. Christena Flake, MD 11/02/2023 Active Self  FLUoxetine (PROZAC) 20 MG capsule [id number] No Take 1 capsule (20 mg total) by mouth every evening. Christena Flake, MD 11/02/2023 Active Self  fluticasone (FLONASE) 50 MCG/ACT nasal spray [id number] No Place 2 sprays into both nostrils daily as needed for allergies or rhinitis. Home med. Poggi, Excell Seltzer, MD Taking Active Self  Galcanezumab-gnlm 120 MG/ML Ivory Broad [id number] No Inject 120 mg into the skin. Monthly [provider] Taking Active Self  Insulin Glargine (BASAGLAR KWIKPEN) 100 UNIT/ML [id number] No Inject 36 Units into the skin at bedtime. Jacky Kindle, FNP 11/02/2023 Evening Active Self  insulin lispro (HUMALOG) 100 UNIT/ML injection [id number] No Inject 10 Units into the skin 3 (three) times daily before meals. [provider] 11/02/2023 Active Self  Insulin Pen Needle 32G X 4 MM MISC [id number] No 1 each by Does not apply route in the morning, at noon, in the evening, and at bedtime. Jacky Kindle, FNP Taking Active Self  ipratropium-albuterol (DUONEB) 0.5-2.5 (3) MG/3ML SOLN [id number] No Take 3 mLs by nebulization every 6 (six) hours as needed. Sallee Provencal, FNP Taking Active Self  isosorbide mononitrate (IMDUR) 30 MG 24 hr tablet [id number] No Take 0.5 tablets (15 mg total) by mouth every morning. Christena Flake, MD 11/02/2023 Active Self  meclizine (ANTIVERT) 25 MG tablet [id number] No Take 0.5 tablets (12.5 mg total) by mouth 3  (three) times daily as needed for dizziness. Poggi, Excell Seltzer, MD Taking Active Self  metoCLOPramide (REGLAN) 5 MG tablet [id number] No Take 1 tablet (5 mg total) by mouth every 8 (eight) hours as needed for nausea. Toney Reil, MD Taking Active Self  nitroGLYCERIN (NITROSTAT) 0.4 MG SL tablet [id number] No Place 1 tablet (0.4 mg total) under the tongue every 5 (five) minutes x 3 doses as needed for chest pain. Jacky Kindle, FNP Taking Active Self  omeprazole (PRILOSEC) 40 MG capsule [id number] No Take 1 capsule (40 mg total) by mouth every morning. Christena Flake, MD 11/02/2023 Active Self  oxyCODONE (ROXICODONE) 5 MG immediate release tablet [id number] No Take 1-2 tablets (5-10 mg total) by mouth every 4 (four) hours as needed for moderate pain (pain score 4-6) or severe pain (pain score 7-10). Poggi, Excell Seltzer, MD Taking Active Self  potassium chloride SA (KLOR-CON M) 20 MEQ tablet [id number] No Take 1 tablet (20 mEq total) by mouth every other day. AM Poggi, Excell Seltzer, MD 11/02/2023 Active Self  REZDIFFRA 100 MG TABS [id number] No TAKE 1 TABLET BY MOUTH DAILY WITH OR WITHOUT FOOD Toney Reil, MD 11/02/2023 Active Self  rivaroxaban (XARELTO) 20 MG TABS tablet [id number] No Take 1 tablet (20 mg total) by mouth  daily with supper. Bensimhon, Bevelyn Buckles, MD 11/02/2023 Active Self  rosuvastatin (CRESTOR) 20 MG tablet [id number] No Take 1 tablet (20 mg total) by mouth at bedtime. Christena Flake, MD 11/02/2023 Active Self  sacubitril-valsartan (ENTRESTO) 24-26 MG [id number]  Take 1 tablet by mouth 2 (two) times daily. Esaw Grandchild A, DO  Active   Semaglutide, 2 MG/DOSE, (OZEMPIC, 2 MG/DOSE,) 8 MG/3ML SOPN [id number] No Inject 2 mg into the skin once a week. Tuesdays Christena Flake, MD Taking Active Self  senna-docusate (SENOKOT-S) 8.6-50 MG tablet [id number]  Take 1 tablet by mouth at bedtime as needed for mild constipation. Esaw Grandchild A, DO  Active   spironolactone (ALDACTONE) 25 MG tablet [id number] No Take 1  tablet by mouth once daily Delma Freeze, [place] 11/02/2023 Active Self  TALTZ 80 MG/ML SOAJ [id number] No Inject 3 mLs into the skin every 28 (twenty-eight) days. [provider] Taking Active Self           Med Note Lenoria Farrier   Fri Aug 08, 2023  3:20 PM)    torsemide (DEMADEX) 20 MG tablet [id number] No Take 1 tablet (20 mg total) by mouth every morning. Christena Flake, MD 11/02/2023 Active Self  umeclidinium-vilanterol (ANORO ELLIPTA) 62.5-25 MCG/ACT AEPB [id number] No Inhale 1 puff into the lungs every morning. Poggi, Excell Seltzer, MD Taking Active Self            Home Care and Equipment/Supplies: Were Home Health Services Ordered?: NA Any new equipment or medical supplies ordered?: NA  Functional Questionnaire: Do you need assistance with bathing/showering or dressing?: No Do you need assistance with meal preparation?: No Do you need assistance with eating?: No Do you have difficulty maintaining continence: No Do you need assistance with getting out of bed/getting out of a chair/moving?: No Do you have difficulty managing or taking your medications?: No  Follow up appointments reviewed: PCP Follow-up appointment confirmed?: Yes Date of PCP follow-up appointment?: 11/20/23 Follow-up Provider: [facility] [place]. His PCP is not available for months Specialist Hospital Follow-up appointment confirmed?: Yes Date of Specialist follow-up appointment?: 11/13/23 Follow-Up Specialty Provider:: Clarisa Kindred Do you need transportation to your follow-up appointment?: No Do you understand care options if your condition(s) worsen?: Yes-patient verbalized understanding  SDOH Interventions Today    Flowsheet Row Most Recent Value  SDOH Interventions   Food Insecurity Interventions Intervention Not Indicated  Housing Interventions Intervention Not Indicated  Transportation Interventions Intervention Not Indicated  Utilities Interventions Intervention Not Indicated      Interventions  Today    Flowsheet Row Most Recent Value  Chronic Disease   Chronic disease during today's visit Atrial Fibrillation (AFib)  General Interventions   General Interventions Discussed/Reviewed General Interventions Discussed, General Interventions Reviewed, Doctor Visits  Doctor Visits Discussed/Reviewed Doctor Visits Reviewed  Exercise Interventions   Exercise Discussed/Reviewed Weight Managment  Weight Management Weight loss  Education Interventions   Education Provided Provided Education  Provided Verbal Education On Medication, Development worker, community, Community Resources  Nutrition Interventions   Nutrition Discussed/Reviewed Supplemental nutrition  Pharmacy Interventions   Pharmacy Dicussed/Reviewed Medications and their functions      The patient is filling out forms for housing with Social Services. He has a place to live with his father but he wants his own place. He received his discharge instructions and does not have questions.  Deidre Ala, BSN, RN [place]  [facility] - [employer] [facility] RN Care Manager [phone]

## 2023-11-11 ENCOUNTER — Telehealth: Payer: Self-pay

## 2023-11-12 ENCOUNTER — Telehealth: Payer: Self-pay | Admitting: Family

## 2023-11-12 NOTE — Telephone Encounter (Incomplete)
 Called to confirm/remind patient of their appointment at [facility] on 11/13/[facility]***.   Appointment:   [x] Confirmed  [] Left mess   [] No answer/No voice mail  [] Phone not in service  Patient reminded to bring all medications and/or complete list.  Confirmed patient has transportation. Gave directions, instructed to utilize valet parking.

## 2023-11-12 NOTE — Progress Notes (Unsigned)
 Advanced [facility] Failure Clinic Note   PCP: Merita Norton, FNP (last seen 08/24) Primary Cardiologist: Julien Nordmann, MD (last seen 07/24; returns 05/25) HF cardiologist: Arvilla Meres, MD (last seen 09/24; returns 03/25)  Chief Complaint: shortness of breath  HPI:  Mr Goodrich is a 53 y/o male with a history of HTN, T2DM, chronic chest pain, chronic troponin elevation, COPD, CKD, asthma, NSVT, OSA, unprovoked PE, pulmonary HTN, psoriasis, migraines, cirrhosis from NASH, ICD placement (10/23), previous tobacco use and chronic heart failure. Had left shoulder arthroscopy with debridement, decompression, rotator cuff repair and biceps tenodesis done 09/16/23.   RHC/LHC done 06/13/20 and showed: Severe single-vessel coronary artery disease with chronic total occlusion of the proximal RCA.  The distal vessel fills via left to right collaterals. Mild to moderate, nonobstructive CAD involving the LAD and LCx with up to 50% stenosis. Mildly elevated left and right heart filling pressures. Moderate pulmonary hypertension (mean PAP 40 mmHg) with significantly elevated pulmonary vascular resistance (PVR 7.4 WU). Severely reduced Fick cardiac output/index.  Echo 05/21/21: EF of 40-45% along with mild LVH and mild MR.   Admitted 09/2021 & 11/2021 with HF.   Echo 11/24/21: EF of 20-25% along with mild LVH and mild MR.  Echo 02/06/22: EF of 30-35% along with mild/moderate AR.   Admitted 05/20/22 for ICD placement and discharged the next day. Was in the ED 12/21/22 due to dizziness & chest pain.   Echo 08/14/22: EF 40-45% with mild LVH, Grade I DD, severe hypokinesis of left ventricular, entire inferior and inferolateral wall.  Echo 03/08/23: EF 45-50% with moderate LVH, trivial MR, mild dilatation of aortic root at 43 mm  Was in the ED 06/09/23 due to SOB/ chest pain for previous 2 days. CXR concerning for pneumonia so antibiotics provided. Initial troponin in the 50's which is close to his baseline. EKG  without ischemic changes.   RHC/ LHC 08/19/23:  Relatively stable appearance of the coronary arteries since last catheterization in 2021.  Chronically occluded proximal RCA now has antegrade flow, though I suspect this represents bridging collaterals.  Left-to-right collateral remain as well as moderate LAD and LCx disease. Mildly elevated left heart filling pressures (LVEDP 20 mmHg, PCWP 18 mmHg). Moderate pulmonary hypertension (mean PA 41 mmHg, PVR 5.3 WU). Moderately reduced Fick cardiac output/index (CO 4.3 L/min, CI 1.9 L/min/m^2).  Admitted 10/22/23 due to COPD exacerbation secondary to influenza.    He presents today for an acute HF visit with a chief complaint of dizziness with position changes. Says this has been present since carvedilol was increased ~ 1 month ago. Has shortness of breath, fatigue, palpitations (increasing), decreased appetite, chronic chest pain and weight loss along with this. Denies abdominal distention or pedal edema. He says that he feels dizzy especially after he takes his morning medications. His chest pain isn't worse but the palpitations are more frequent.   Took his last ozempic yesterday.   ROS: All systems negative except as listed in HPI, PMH and Problem List.  SH:  Social History   Socioeconomic History   Marital status: Widowed    Spouse name: Not on file   Number of children: 1   Years of education: 61   Highest education level: 12th grade  Occupational History   Occupation: disabled  Tobacco Use   Smoking status: Former    Current packs/day: 0.00    Average packs/day: 0.5 packs/day for 33.0 years (16.5 ttl pk-yrs)    Types: Cigarettes    Start date: 05/09/1987  Quit date: 05/08/2020    Years since quitting: 3.5   Smokeless tobacco: Former    Quit date: 05/08/2020   Tobacco comments:    Quit Sept 2021  Vaping Use   Vaping status: Former  Substance and Sexual Activity   Alcohol use: Not Currently   Drug use: No   Sexual activity: Not  Currently  Other Topics Concern   Not on file  Social History Narrative   Lives at home with wife and his dad's wife.        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    Social Drivers of Health   Financial Resource Strain: Medium Risk (08/05/2022)   Overall Financial Resource Strain (CARDIA)    Difficulty of Paying Living Expenses: Somewhat hard  Food Insecurity: No Food Insecurity (11/10/2023)   Hunger Vital Sign    Worried About Running Out of Food in the Last Year: Never true    Ran Out of Food in the Last Year: Never true  Transportation Needs: No Transportation Needs (11/10/2023)   PRAPARE - Administrator, Civil Service (Medical): No    Lack of Transportation (Non-Medical): No  Physical Activity: Insufficiently Active (01/20/2018)   Exercise Vital Sign    Days of Exercise per Week: 7 days    Minutes of Exercise per Session: 10 min  Stress: Stress Concern Present (09/02/2017)   [name] of Occupational Health - Occupational Stress Questionnaire    Feeling of Stress : Very much  Social Connections: Unknown (10/22/2023)   Social Connection and Isolation Panel [NHANES]    Frequency of Communication with Friends and Family: Not on file    Frequency of Social Gatherings with Friends and Family: Patient declined    Attends Religious Services: Patient declined    Active Member of Clubs or Organizations: Patient declined    Attends Banker Meetings: Patient declined    Marital Status: Patient declined  Intimate Partner Violence: Not At Risk (11/10/2023)   Humiliation, Afraid, Rape, and Kick questionnaire    Fear of Current or Ex-Partner: No    Emotionally Abused: No    Physically Abused: No    Sexually Abused: No    FH:  Family History  Problem Relation Age of Onset   Diabetes Mother    Diabetes Mellitus II Mother    Hypothyroidism Mother    Hypertension Mother    Kidney failure  Mother        Dialysis   Heart attack Mother        55 yo approximately   Hypertension Father    Gout Father    Cancer Maternal Grandfather    Diabetes Maternal Grandfather    Cancer Paternal Aunt     Past Medical History:  Diagnosis Date   Aortic root dilation (HCC) 11/28/2016   a.) TTE 11/28/2016: 43 mm; b.) TTE 11/23/2019: 38 mm; c.) TTE 08/14/2022: 40 mm; d.) TTE 03/08/2023: 43 mm   Arteriosclerosis of right carotid artery    a.) carotid doppler 04/29/2017: <50% RICA   Bronchitis 07/2023   CAD (coronary artery disease)    a.) MV 04/2015: low risk; b.) LHC 01/06/2017: minor irregs in LAD/Diag/LCX/OM, RCA 40p/m/d; c.) R/LHC 06/13/2020: 50% dLAD, 30% p-mLCx, 40% OM1, 100% pRCA (L-R collats) - med mgmt; d.) [facility] 08/19/2023: 50% dLAD, 30% p-mLCx, 40% OM1, 100% pRCA (L-R collats), 50% p-mRCA - med mgmt   Chest wall pain, chronic  Chronic 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    Chronic Troponin Elevation    CKD (chronic kidney disease), stage II-III    COPD (chronic obstructive pulmonary disease) (HCC)    Depression    DM (diabetes mellitus), type 2 (HCC)    HFrEF (heart failure with reduced ejection fraction) (HCC)    a.) EF 03/20/2015: EF 45-50%; b.) MV 05/19/2015: EF 30-44%; c.) TTE 01/04/2016: EF 20-25%; d.) TTE 02/29/2016: EF 30-35%; e.) TTE 11/28/2016: EF 40-45%; f.) LHC 01/06/2017: EF 25-35%; g.) TTE 04/29/2017: EF 45-50%; h.) TTE 06/26/2019: EF 30-35%; i.) TTE 11/23/2019: EF 25-30%; j.) TTE 06/08/2020: EF 35-40%; k.) TTE 05/21/2021: EF 40-45%; l.) TTE 08/14/2022: EF 40-45%; m.) TTE 03/08/2023: EF 40-45%   History of 2019 novel coronavirus disease (COVID-19) 03/01/2022   History of methicillin resistant staphylococcus aureus (MRSA)    Hyperlipidemia    Hypertension    ICD (implantable cardioverter-defibrillator) in place 05/20/2022   a.) LEFT midxillary [employer] Emblem MRI S-ICD (SN: [id number])   Left rotator cuff tear    Long term current use of immunosuppressive drug     a.) ixekizumab for psoriasis/psoriatic arthritis diagnosis   Long-term use of aspirin therapy    Mediastinal lymphadenopathy    Mitral regurgitation    Mild to moderate by October 2021 echocardiogram.   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 12/2015 noted on tele-->amio;  b. 12/2015 Event monitor: no VT noted.   Obesity (BMI 30.0-34.9)    On rivaroxaban therapy    OSA treated with BiPAP    Polyp of descending colon    Psoriasis    Psoriatic arthritis (HCC)    a.) Tx'd with ixekizumab   Pulmonary HTN (HCC) 02/29/2016   a.) TTE 02/29/2016: RVSP 47; b.) TTE 06/26/2019: RVSP 58.4; c.) TTE 12/08/2019: RVSP 40.8; d.) TTE 06/08/2020: RVSP 44.7; e.) R/LHC 06/08/2020: mRA 9, mPA 40, mPWCP 17, AO sat 98, PA sat 61, CO 3.1, CI 1.3, PVR 7.4, LVEDP 20; f.) R/LHC 08/18/2020: mRA 5, mPA 41, mPCWP 18, AO sat 92, PA sat 59, CO 4.3, CI 1.9, PVR 5.3   Recurrent pulmonary emboli (HCC) 06/07/2020   06/07/20: small bilateral PEs.  12/31/19: RUL and RLL PEs.   Stroke [facility])    pt states per mri in 2023-unsure of when stroke happened   Syncope    a. 01/2016 - felt to be vasovagal.   Thrombocytopenia (HCC)     Current Outpatient Medications  Medication Sig Dispense Refill   Accu-Chek Softclix Lancets lancets USE UP TO FOUR TIMES DAILY     albuterol (VENTOLIN HFA) 108 (90 Base) MCG/ACT inhaler Inhale 2 puffs into the lungs every 4 (four) hours as needed for wheezing or shortness of breath. 8 g 6   allopurinol (ZYLOPRIM) 300 MG tablet Take 300 mg by mouth every morning.     amiodarone (PACERONE) 200 MG tablet Take 2 tablets (400 mg total) by mouth 2 (two) times daily for 14 days, THEN 1 tablet (200 mg total) 2 (two)  times daily for 14 days, THEN 1 tablet (200 mg total) 2 (two) times daily. 144 tablet 0   aspirin EC 81 MG tablet Take 81 mg by mouth every morning.     azelastine (  ASTELIN) 0.1 % nasal spray Place 2 sprays into both nostrils 2 (two) times daily. Use in each nostril as directed 30 mL 12   BD PEN NEEDLE NANO 2ND GEN 32G X 4 MM MISC USE  AS DIRECTED AT BEDTIME 200 each 0   blood glucose meter kit and supplies KIT Dispense based on patient and insurance preference. Use up to four times daily as directed. 1 each 11   buPROPion (WELLBUTRIN XL) 150 MG 24 hr tablet Take 150 mg by mouth daily.     butalbital-acetaminophen-caffeine (FIORICET) 50-325-40 MG tablet Take 1 tablet by mouth every 4 (four) hours as needed for migraine. (Patient not taking: Reported on 11/03/2023)     carvedilol (COREG) 3.125 MG tablet Take 1 tablet (3.125 mg total) by mouth 2 (two) times daily with a meal. 60 tablet 2   cetirizine (ZYRTEC) 10 MG tablet Take 1 tablet (10 mg total) by mouth every morning.     colchicine 0.6 MG tablet Take 1 tablet (0.6 mg total) by mouth daily as needed (gout flare).     Continuous Glucose Sensor (FREESTYLE LIBRE 3 SENSOR) MISC PLACE 1 SENSOR ON TO THE SKIN EVERY 14 DAYS. USE TO CHECK BLOOD SUGAR CONTINUEOUSLY.     Continuous Glucose Sensor (FREESTYLE LIBRE 3 SENSOR) MISC 1 Application by Does not apply route every 14 (fourteen) days. Use to check blood sugar continuously. 6 each 0   Continuous Glucose Sensor (FREESTYLE LIBRE 3 SENSOR) MISC Apply sensor every 14 (fourteen) days to check blood sugar continuously. 6 each 0   dapagliflozin propanediol (FARXIGA) 10 MG TABS tablet Take 1 tablet (10 mg total) by mouth daily. 90 tablet 3   feeding supplement, GLUCERNA SHAKE, (GLUCERNA SHAKE) LIQD Take 237 mLs by mouth 3 (three) times daily between meals.     fenofibrate (TRICOR) 145 MG tablet Take 1 tablet (145 mg total) by mouth every evening.     FLUoxetine (PROZAC) 20 MG capsule Take 1 capsule (20 mg  total) by mouth every evening.     fluticasone (FLONASE) 50 MCG/ACT nasal spray Place 2 sprays into both nostrils daily as needed for allergies or rhinitis. Home med.     Galcanezumab-gnlm 120 MG/ML SOAJ Inject 120 mg into the skin. Monthly     Insulin Glargine (BASAGLAR KWIKPEN) 100 UNIT/ML Inject 36 Units into the skin at bedtime. 45 mL 3   insulin lispro (HUMALOG) 100 UNIT/ML injection Inject 10 Units into the skin 3 (three) times daily before meals.     Insulin Pen Needle 32G X 4 MM MISC 1 each by Does not apply route in the morning, at noon, in the evening, and at bedtime. 120 each 11   ipratropium-albuterol (DUONEB) 0.5-2.5 (3) MG/3ML SOLN Take 3 mLs by nebulization every 6 (six) hours as needed. 360 mL 11   isosorbide mononitrate (IMDUR) 30 MG 24 hr tablet Take 0.5 tablets (15 mg total) by mouth every morning.     meclizine (ANTIVERT) 25 MG tablet Take 0.5 tablets (12.5 mg total) by mouth 3 (three) times daily as needed for dizziness.     metoCLOPramide (REGLAN) 5 MG tablet Take 1 tablet (5 mg total) by mouth every 8 (eight) hours as needed for nausea. 30 tablet 0   nitroGLYCERIN (NITROSTAT) 0.4 MG SL tablet Place 1 tablet (0.4 mg total) under the tongue every 5 (five) minutes x 3 doses as needed for chest pain. 30 tablet 0   omeprazole (PRILOSEC) 40 MG capsule Take 1 capsule (40 mg total)  by mouth every morning.     oxyCODONE (ROXICODONE) 5 MG immediate release tablet Take 1-2 tablets (5-10 mg total) by mouth every 4 (four) hours as needed for moderate pain (pain score 4-6) or severe pain (pain score 7-10). 40 tablet 0   potassium chloride SA (KLOR-CON M) 20 MEQ tablet Take 1 tablet (20 mEq total) by mouth every other day. AM     REZDIFFRA 100 MG TABS TAKE 1 TABLET BY MOUTH DAILY WITH OR WITHOUT FOOD 30 tablet 0   rivaroxaban (XARELTO) 20 MG TABS tablet Take 1 tablet (20 mg total) by mouth daily with supper. 90 tablet 3   rosuvastatin (CRESTOR) 20 MG tablet Take 1 tablet (20 mg total) by  mouth at bedtime.     sacubitril-valsartan (ENTRESTO) 24-26 MG Take 1 tablet by mouth 2 (two) times daily. 60 tablet 2   Semaglutide, 2 MG/DOSE, (OZEMPIC, 2 MG/DOSE,) 8 MG/3ML SOPN Inject 2 mg into the skin once a week. Tuesdays     senna-docusate (SENOKOT-S) 8.6-50 MG tablet Take 1 tablet by mouth at bedtime as needed for mild constipation.     spironolactone (ALDACTONE) 25 MG tablet Take 1 tablet by mouth once daily 90 tablet 1   TALTZ 80 MG/ML SOAJ Inject 3 mLs into the skin every 28 (twenty-eight) days.     torsemide (DEMADEX) 20 MG tablet Take 1 tablet (20 mg total) by mouth every morning.     umeclidinium-vilanterol (ANORO ELLIPTA) 62.5-25 MCG/ACT AEPB Inhale 1 puff into the lungs every morning.     No current facility-administered medications for this visit.   There were no vitals filed for this visit.  Wt Readings from Last 3 Encounters:  11/03/23 229 lb 15 oz (104.3 kg)  11/03/23 230 lb (104.3 kg)  10/22/23 245 lb (111.1 kg)   Lab Results  Component Value Date   CREATININE 1.56 (H) 11/07/2023   CREATININE 1.20 11/06/2023   CREATININE 1.11 11/05/2023    PHYSICAL EXAM:  General: Tired appearing. No resp difficulty HEENT: normal Neck: supple, no JVD Cor: Irregular rhythm, normal rate -> tachycardia. No rubs, gallops or murmurs Lungs: clear Abdomen: soft, nontender, nondistended. Extremities: no cyanosis, clubbing, rash, edema Neuro: alert & oriented X 3. Moves all 4 extremities w/o difficulty. Affect pleasant   ECG: atrial fibrillation, HR 132 (personally reviewed)   ASSESSMENT & PLAN:  1.  Chronic HFrEF/Mixed Ischemic and nonischemic cardiomyopathy:   - Mixed ICM/NICM - NYHA class III - s/p [employer] Sq-ICD  - weighing daily; reminded to call for an overnight weight gain of > 2 pounds or a weekly weight gain of > 5 pounds - weight down 15 pounds from last visit here 1 month ago - Echo 05/21/21: EF of 40-45% along with mild LVH and mild MR.  - Echo 11/24/21: EF of  20-25% along with mild LVH and mild MR.  - Echo 02/06/22: EF of 30-35% along with mild/moderate AR.  - Echo 08/14/22: EF 40-45% with mild LVH, Grade I DD, severe hypokinesis of left ventricular, entire inferior and inferolateral wall.  - Echo 03/08/23: EF 45-50% with moderate LVH, trivial MR, mild dilatation of aortic root at 43 mm - unable to tolerate 12.5mg  carvedilol dose with increased dizziness; will hold today and decrease back to 6.25mg  BID (this may change pending admission) - continue Farxiga 10mg  daily - continue Entresto 97/103 bid - continue spiro 25 daily - continue torsemide 20mg  daily  - continue potassium QOD - continue to use Furoscix if weight >=  250 - consider Cardiomems to help volume management as needed -> his insurance won't cover - saw EP (Riddle) 02/25 - ICD placed on 05/20/22 - saw ADHF provider (Bensimhon) 09/24 - BNP 10/22/23 reviewed and was 125.8   2.  CAD/Elevated Troponin and chronic CP:  - H/o CAD  -  RHC/ LHC 08/19/23:     1. Relatively stable appearance of the coronary arteries since last catheterization in 2021.  Chronically occluded proximal RCA now has antegrade flow, though I suspect this represents bridging collaterals.  Left-to-right collateral remain as well as moderate LAD and LCx disease.    2. Mildly elevated left heart filling pressures (LVEDP 20 mmHg, PCWP 18 mmHg).    3. Moderate pulmonary hypertension (mean PA 41 mmHg, PVR 5.3 WU).    4. Moderately reduced Fick cardiac output/index (CO 4.3 L/min, CI 1.9 L/min/m^2). - continue rosuvastatin 20mg  daily - continue isosorbide MN 15mg  daily - saw cardiology Fransico Michael) 01/25   3.  HTN:  - BP 105/70 - saw PCP Ephriam Knuckles) 12/24 - BMP 10/24/23 reviewed and showed sodium 131, potassium 4.4, creatinine 1.56& GFR 52   4.  H/o PE/Chronic anticoagulation:  - continue xarelto 20mg  daily - no recent bleeding   5.  DM2- - saw nephrology (Korrapati) 08/23  - A1c 07/11/23 reviewed and was 6.6%   6.  OSA- - Sleep study 3/22: AHI 28  - sleep study 12/23; AHI 45.1 - sleep study 11/24 AHI 70.0 - wearing CPAP nightly unless feeling very congested - saw pulmonology Wynona Neat) 11/24  7: AF RVR- - EKG today is AF RVR with HR 132 - hasn't missed any xarelto - due to low BP and worsening symptoms of dizziness / palpitations, unable to safely use amiodarone outpatient - took to ED via wheelchair with HF RN present - EP advised - last used ozempic yesterday   Return here pending admission   Delma Freeze, FNP 11/12/23

## 2023-11-13 ENCOUNTER — Other Ambulatory Visit
Admission: RE | Admit: 2023-11-13 | Discharge: 2023-11-13 | Disposition: A | Discharge status: Home / Self Care | Source: Ambulatory Visit | Attending: Family | Admitting: Family

## 2023-11-13 ENCOUNTER — Encounter: Payer: Self-pay | Admitting: Family

## 2023-11-13 ENCOUNTER — Ambulatory Visit (HOSPITAL_BASED_OUTPATIENT_CLINIC_OR_DEPARTMENT_OTHER): Admitting: Family

## 2023-11-13 VITALS — BP 121/67 | HR 76 | Wt 230.5 lb

## 2023-11-13 DIAGNOSIS — I2510 Atherosclerotic heart disease of native coronary artery without angina pectoris: Secondary | ICD-10-CM | POA: Diagnosis not present

## 2023-11-13 DIAGNOSIS — Z79899 Other long term (current) drug therapy: Secondary | ICD-10-CM | POA: Diagnosis not present

## 2023-11-13 DIAGNOSIS — I272 Pulmonary hypertension, unspecified: Secondary | ICD-10-CM | POA: Diagnosis not present

## 2023-11-13 DIAGNOSIS — I5022 Chronic systolic (congestive) heart failure: Secondary | ICD-10-CM | POA: Diagnosis not present

## 2023-11-13 DIAGNOSIS — Z7901 Long term (current) use of anticoagulants: Secondary | ICD-10-CM | POA: Diagnosis not present

## 2023-11-13 DIAGNOSIS — I480 Paroxysmal atrial fibrillation: Secondary | ICD-10-CM

## 2023-11-13 DIAGNOSIS — Z7985 Long-term (current) use of injectable non-insulin antidiabetic drugs: Secondary | ICD-10-CM | POA: Diagnosis not present

## 2023-11-13 DIAGNOSIS — I2699 Other pulmonary embolism without acute cor pulmonale: Secondary | ICD-10-CM | POA: Diagnosis not present

## 2023-11-13 DIAGNOSIS — Z86711 Personal history of pulmonary embolism: Secondary | ICD-10-CM | POA: Diagnosis not present

## 2023-11-13 DIAGNOSIS — Z95810 Presence of automatic (implantable) cardiac defibrillator: Secondary | ICD-10-CM | POA: Diagnosis not present

## 2023-11-13 DIAGNOSIS — Z5986 Financial insecurity: Secondary | ICD-10-CM | POA: Diagnosis not present

## 2023-11-13 DIAGNOSIS — N182 Chronic kidney disease, stage 2 (mild): Secondary | ICD-10-CM | POA: Diagnosis not present

## 2023-11-13 DIAGNOSIS — N1832 Chronic kidney disease, stage 3b: Secondary | ICD-10-CM | POA: Diagnosis not present

## 2023-11-13 DIAGNOSIS — I3139 Other pericardial effusion (noninflammatory): Secondary | ICD-10-CM | POA: Insufficient documentation

## 2023-11-13 DIAGNOSIS — K7581 Nonalcoholic steatohepatitis (NASH): Secondary | ICD-10-CM | POA: Diagnosis not present

## 2023-11-13 DIAGNOSIS — J4489 Other specified chronic obstructive pulmonary disease: Secondary | ICD-10-CM | POA: Diagnosis not present

## 2023-11-13 DIAGNOSIS — Z87891 Personal history of nicotine dependence: Secondary | ICD-10-CM | POA: Insufficient documentation

## 2023-11-13 DIAGNOSIS — Z7984 Long term (current) use of oral hypoglycemic drugs: Secondary | ICD-10-CM | POA: Diagnosis not present

## 2023-11-13 DIAGNOSIS — E1122 Type 2 diabetes mellitus with diabetic chronic kidney disease: Secondary | ICD-10-CM | POA: Diagnosis not present

## 2023-11-13 DIAGNOSIS — G4733 Obstructive sleep apnea (adult) (pediatric): Secondary | ICD-10-CM

## 2023-11-13 DIAGNOSIS — Z794 Long term (current) use of insulin: Secondary | ICD-10-CM | POA: Diagnosis not present

## 2023-11-13 DIAGNOSIS — Z7982 Long term (current) use of aspirin: Secondary | ICD-10-CM | POA: Diagnosis not present

## 2023-11-13 DIAGNOSIS — I428 Other cardiomyopathies: Secondary | ICD-10-CM | POA: Diagnosis not present

## 2023-11-13 DIAGNOSIS — I10 Essential (primary) hypertension: Secondary | ICD-10-CM | POA: Diagnosis not present

## 2023-11-13 DIAGNOSIS — I130 Hypertensive heart and chronic kidney disease with heart failure and stage 1 through stage 4 chronic kidney disease, or unspecified chronic kidney disease: Secondary | ICD-10-CM | POA: Diagnosis not present

## 2023-11-13 DIAGNOSIS — I340 Nonrheumatic mitral (valve) insufficiency: Secondary | ICD-10-CM | POA: Diagnosis not present

## 2023-11-13 LAB — BASIC METABOLIC PANEL WITH GFR
Anion gap: 11 (ref 5–15)
BUN: 30 mg/dL — ABNORMAL HIGH (ref 6–20)
CO2: 23 mmol/L (ref 22–32)
Calcium: 9.3 mg/dL (ref 8.9–10.3)
Chloride: 100 mmol/L (ref 98–111)
Creatinine, Ser: 1.58 mg/dL — ABNORMAL HIGH (ref 0.61–1.24)
GFR, Estimated: 52 mL/min — ABNORMAL LOW (ref >60)
Glucose, Bld: 136 mg/dL — ABNORMAL HIGH (ref 70–99)
Potassium: 4.3 mmol/L (ref 3.5–5.1)
Sodium: 134 mmol/L — ABNORMAL LOW (ref 135–145)

## 2023-11-13 LAB — BRAIN NATRIURETIC PEPTIDE: B Natriuretic Peptide: 262.3 pg/mL — ABNORMAL HIGH (ref 0.0–100.0)

## 2023-11-13 LAB — MAGNESIUM: Magnesium: 2.2 mg/dL (ref 1.7–2.4)

## 2023-11-13 LAB — CBC
HCT: 43.3 % (ref 39.0–52.0)
Hemoglobin: 14 g/dL (ref 13.0–17.0)
MCH: 25.2 pg — ABNORMAL LOW (ref 26.0–34.0)
MCHC: 32.3 g/dL (ref 30.0–36.0)
MCV: 78 fL — ABNORMAL LOW (ref 80.0–100.0)
Platelets: 263 10*3/uL (ref 150–400)
RBC: 5.55 MIL/uL (ref 4.22–5.81)
RDW: 15.4 % (ref 11.5–15.5)
WBC: 7.6 10*3/uL (ref 4.0–10.5)
nRBC: 0 % (ref 0.0–0.2)

## 2023-11-13 NOTE — Patient Instructions (Signed)
 Medication Changes:  No medication changes today!  Lab Work:  Go DOWN to LOWER LEVEL (LL) to have your blood work completed inside of [employer] office.  We will only call you if the results are abnormal or if the provider would like to make medication changes.    Follow-Up in: Please follow up in about 2 months with Dr. Gala Romney.  At the [facility], you and your health needs are our priority. We have a designated team specialized in the treatment of Heart Failure. This Care Team includes your primary Heart Failure Specialized Cardiologist (physician), Advanced Practice Providers (APPs- Physician Assistants and Nurse Practitioners), and Pharmacist who all work together to provide you with the care you need, when you need it.   You may see any of the following providers on your designated Care Team at your next follow up:  Dr. Arvilla Meres Dr. Marca Ancona Dr. Dorthula Nettles Dr. Theresia Bough Clarisa Kindred, FNP Enos Fling, RPH-CPP  Please be sure to bring in all your medications bottles to every appointment.   Need to Contact us:  If you have any questions or concerns before your next appointment please send [place] a message through [place] or call our office at [phone].    TO LEAVE A MESSAGE FOR THE NURSE SELECT OPTION 2, PLEASE LEAVE A MESSAGE INCLUDING: YOUR NAME DATE OF BIRTH CALL BACK NUMBER REASON FOR CALL**this is important as we prioritize the call backs  YOU WILL RECEIVE A CALL BACK THE SAME DAY AS LONG AS YOU CALL BEFORE 4:00 PM

## 2023-11-13 NOTE — Progress Notes (Signed)
 [facility] - HEART FAILURE CLINIC - PHARMACIST COUNSELING NOTE  Adherence Assessment  Do you ever forget to take your medication? [] Yes [x] No  Do you ever skip doses due to side effects? [] Yes [x] No  Do you have trouble affording your medicines? [] Yes [x] No  Are you ever unable to pick up your medication due to transportation difficulties? [] Yes [x] No  Do you ever stop taking your medications because you don't believe they are helping? [] Yes [x] No  Do you check your weight daily? [x] Yes [] No  Do you check your blood pressure daily? [x] Yes [] No  Adherence strategy: Pill box   Barriers to obtaining medications: None reported   Vital signs: HR 76, BP 121/67, weight 230 lbs. ECHO: Date 11/04/2023, EF 35-40% Renal function: Date 11/13/2023, GFR 52  Current Guideline-Directed Medical Therapy/Evidence Based Medicine  ACE/ARB/ARNI: Sacubitril-valsartan 24-26 mg twice daily Target dose: 97/103 BID  Beta Blocker: Carvedilol 3.125 mg twice daily Target dose: 50 mg BID  Aldosterone Antagonist: Spironolactone 25 mg daily Target dose: 50 mg daily   SGLT2i: Dapagliflozin 10 mg daily Target dose: On target dose  Diuretic: Torsemide 20 mg daily / KCl 20 mEq every other day  ASSESSMENT 53 year old male who presents to the HF clinic for a follow-up appointment after a recent hospital admission from 11/03/2023 - 11/07/2023. PMH is significant for atrial fibrillation (03/25), HTN, T2DM, chronic chest pain, chronic troponin elevation, COPD, CKD, asthma, NSVT, OSA, unprovoked PE, pulmonary HTN, psoriasis, migraines, cirrhosis from NASH, ICD placement (10/23), previous tobacco use and chronic heart failure. Had left shoulder arthroscopy with debridement, decompression, rotator cuff repair and biceps tenodesis done 09/16/23.   Recent ED visit or hospitalization (past 6 months):  Date: 11/03/2023 - 11/07/2023, CC: tachycardia , Admission Dx: atrial fibrillation with RVR Date:  10/22/2023 - 10/24/2023, CC: SOB, Admission Dx: COPD exacerbation  Date: 07/22/2023, CC: cough  Date: 06/09/2023, CC: SOB & chest pain   PLAN Recommendations discussed with NP Continue current regimen as directed by NP  Consider titration of current GDMT to target doses if/when able   Time spent: 20 minutes   Littie Deeds, PharmD Pharmacy Resident  11/14/2023 3:38 PM

## 2023-11-14 ENCOUNTER — Encounter: Payer: Self-pay | Admitting: Family

## 2023-11-14 ENCOUNTER — Other Ambulatory Visit: Payer: Self-pay | Admitting: Gastroenterology

## 2023-11-17 ENCOUNTER — Encounter: Payer: Self-pay | Admitting: Oncology

## 2023-11-17 DIAGNOSIS — G8929 Other chronic pain: Secondary | ICD-10-CM | POA: Diagnosis not present

## 2023-11-17 DIAGNOSIS — M25512 Pain in left shoulder: Secondary | ICD-10-CM | POA: Diagnosis not present

## 2023-11-17 DIAGNOSIS — M6281 Muscle weakness (generalized): Secondary | ICD-10-CM | POA: Diagnosis not present

## 2023-11-17 DIAGNOSIS — Z9889 Other specified postprocedural states: Secondary | ICD-10-CM | POA: Diagnosis not present

## 2023-11-17 DIAGNOSIS — M25612 Stiffness of left shoulder, not elsewhere classified: Secondary | ICD-10-CM | POA: Diagnosis not present

## 2023-11-18 ENCOUNTER — Ambulatory Visit (INDEPENDENT_AMBULATORY_CARE_PROVIDER_SITE_OTHER): Payer: Medicaid Other

## 2023-11-18 DIAGNOSIS — I428 Other cardiomyopathies: Secondary | ICD-10-CM | POA: Diagnosis not present

## 2023-11-18 LAB — CUP PACEART REMOTE DEVICE CHECK
Battery Remaining Percentage: 84 %
Date Time Interrogation Session: 20250401124600
HighPow Impedance: 75 Ohm
Implantable Lead Connection Status: 753985
Implantable Lead Implant Date: 20231002
Implantable Lead Location: 753862
Implantable Lead Model: 3501
Implantable Lead Serial Number: 221766
Implantable Pulse Generator Implant Date: 20231002
Pulse Gen Serial Number: 189685

## 2023-11-19 ENCOUNTER — Telehealth: Payer: Self-pay | Admitting: Pharmacist

## 2023-11-19 ENCOUNTER — Other Ambulatory Visit: Payer: Self-pay | Admitting: Pharmacist

## 2023-11-19 ENCOUNTER — Other Ambulatory Visit: Payer: Self-pay

## 2023-11-19 DIAGNOSIS — E0822 Diabetes mellitus due to underlying condition with diabetic chronic kidney disease: Secondary | ICD-10-CM

## 2023-11-19 DIAGNOSIS — I5022 Chronic systolic (congestive) heart failure: Secondary | ICD-10-CM

## 2023-11-19 DIAGNOSIS — I4891 Unspecified atrial fibrillation: Secondary | ICD-10-CM

## 2023-11-19 DIAGNOSIS — J449 Chronic obstructive pulmonary disease, unspecified: Secondary | ICD-10-CM

## 2023-11-19 DIAGNOSIS — E1122 Type 2 diabetes mellitus with diabetic chronic kidney disease: Secondary | ICD-10-CM

## 2023-11-19 MED ORDER — FREESTYLE LIBRE 3 PLUS SENSOR MISC
12 refills | Status: DC
  Filled 2023-11-19: qty 2, fill #0

## 2023-11-19 NOTE — Progress Notes (Unsigned)
 Patient requesting renewal of Ozempic prescription as current Rx is out of refills.   Would you please send renewal to [facility]?  Thank you!  Estelle Grumbles, PharmD, [facility] [phone]

## 2023-11-19 NOTE — Patient Instructions (Signed)
 Goals Addressed             This Visit's Progress    Pharmacy Goals       Our goal A1c is less than 7%. This corresponds with fasting sugars less than 130 and 2 hour after meal sugars less than 180. Please continue to use Freestyle Libre 3 Continuous Glucose monitor for blood sugar monitoring.  Check your blood pressure once daily, and any time you have concerning symptoms like headache, chest pain, dizziness, shortness of breath, or vision changes.    To appropriately check your blood pressure, make sure you do the following:  1) Avoid caffeine, exercise, or tobacco products for 30 minutes before checking. Empty your bladder. 2) Sit with your back supported in a flat-backed chair. Rest your arm on something flat (arm of the chair, table, etc). 3) Sit still with your feet flat on the floor, resting, for at least 5 minutes.  4) Check your blood pressure. Take 1-2 readings.  5) Write down these readings and bring with you to any provider appointments.  Bring your home blood pressure machine with you to a provider's office for accuracy comparison at least once a year.   Make sure you take your blood pressure medications before you come to any office visit, even if you were asked to fast for labs.   Estelle Grumbles, PharmD, [facility] [phone]

## 2023-11-19 NOTE — Progress Notes (Unsigned)
 11/19/2023 Name: Gabriel [name]. MRN: [id number] DOB: 13-Jun-1971  Chief Complaint  Patient presents with   Medication Management    Gabriel [name]. is a 53 y.o. year old male who presented for a telephone visit.   They were referred to the pharmacist by their PCP for assistance in managing diabetes.      Subjective:   Care Team: Primary Care Provider: Sallee Provencal, FNP; Next Scheduled Visit: Tomorrow Rheumatologist: Defoor, Lonia Farber, PA Pulmonologist: Raechel Chute, MD Heart Failure Specialist: Delma Freeze, FNP; Next Scheduled Visit: 12/05/2023 Gastroenterologist: Toney Reil, MD; Next Scheduled Visit: 12/31/2023 Cardiologist: Antonieta Iba, MD; Next Scheduled Visit: 12/26/2023 Electrophysiologist: Lanier Prude, MD; Next Scheduled Visit: 12/24/2023 Orthopedic Specialist: Ernesto Rutherford, MD; Next Scheduled Visit: 12/12/2023    Medication Access/Adherence  Current Pharmacy:  [facility] [address]), [place] - [address] [place]) [place] [address] Phone: [phone] Fax: [phone]  [facility] - [place], [place] - [address] [place] [address] Phone: [phone] Fax: [phone]  [facility] - [place], [place] - [address] [place] [address] Phone: [phone] Fax: [phone]  [facility] [address] [place] [address] Phone: [phone] Fax: [phone]   Patient reports affordability concerns with their medications: No  Patient reports access/transportation concerns to their pharmacy: No  Patient reports adherence concerns with their medications:  No     From review of chart, note:  Patient admitted to [facility] 3/5-10/24/2023 related to COPD exacerbation.    Patient admitted to [facility] on 11/03/2023 related to dizziness/atrial fibrillation. Discharged on 11/07/2023. At discharge advised: Start: amiodarone 200 mg - "2 tablets (400 mg total) by mouth 2 (two) times daily for 14 days, THEN 1 tablet (200 mg total) 2 (two) times daily for 14 days, THEN 1 tablet (200 mg total) 2 (two) times daily."  Entresto dose decreased to Entresto 24-26 mg twice daily Carvedilol dose changed to 3.125 mg twice daily Stop: naltrexone (as patient denied taking)    Diabetes:   Current medications: Ozempic 2 mg weekly, Basaglar 36 units daily, Novolog 10 units base + 1 unit correction for every 50 mg/dL above 283 prior to meals; Farxiga 10 mg daily   Medications tried in the past: metformin - GI intolerance;    Patient uses Freestyle Libre 3 Continuous Glucose Monitor   Review patient's last 14-days of data from LibreView:   Date of Download: 11/19/23 % Time CGM is active: 96% Average Glucose: 133 mg/dL Glucose Management Indicator: 6.5%  Glucose Variability: 18.9% (goal <36%) Time in Goal:  - Time in range 70-180: 94% - Time above range: 6% - Time below range: 0%   Patient denies recent symptoms of hypoglycemia   Patient requesting renewal of his Ozempic prescription as current prescription is out of refills     Heart Failure/Hypertension/Atrial Fibrillation:   Current medications:  ACEi/ARB/ARNI: Entresto 24-26 mg twice daily SGLT2i: Farxiga 10 mg daily Beta blocker: carvedilol 3.125 mg twice daily Mineralocorticoid Receptor Antagonist: spironolactone 25 mg dialy Diuretic regimen: torsemide 20 mg daily + potassium 20 mEq every other day  Isosorbide ER 30 mg - 1/2 tablet each morning   Rhythm Control: amiodarone 200 mg - "2 tablets (400 mg total) by mouth 2 (two) times daily for 14 days, THEN  1 tablet (200 mg total) 2 (two) times daily for 14 days, THEN 1 tablet (200 mg total) 2 (two) times daily."   Anticoagulation  Regimen: Xarelto 20 mg daily with supper    Reports recent home blood pressure readings ranging 120-140/70-80s   Denies symptoms of hypotension such as dizziness or lightheadedness   COPD:   Patient [facility]  [facility]   Current medications: - Anoro Elipta - 1 puff daily - albuterol HFA - 2 puffs every 4 hours as needed for wheezing/shortness of breath - ipratropium/albuterol nebulizer solution every 6 hours as needed   Objective:  Lab Results  Component Value Date   HGBA1C 6.6 (A) 07/11/2023    Lab Results  Component Value Date   CREATININE 1.58 (H) 11/13/2023   BUN 30 (H) 11/13/2023   NA 134 (L) 11/13/2023   K 4.3 11/13/2023   CL 100 11/13/2023   CO2 23 11/13/2023    Lab Results  Component Value Date   CHOL 134 04/16/2023   HDL 24 (L) 08/09/2022   LDLCALC 62 08/09/2022   LDLDIRECT 60.6 11/23/2020   TRIG 339 (H) 04/16/2023   CHOLHDL 6.0 (H) 08/09/2022   BP Readings from Last 3 Encounters:  11/13/23 121/67  11/07/23 102/69  11/03/23 105/70   Pulse Readings from Last 3 Encounters:  11/13/23 76  11/07/23 74  11/03/23 62     Medications Reviewed Today     Reviewed by Manuela Neptune, RPH-CPP (Pharmacist) on 11/19/23 at 1028  Med List Status: <None>   Medication Order Taking? Sig Documenting Provider Last Dose Status Informant  Accu-Chek Softclix Lancets lancets [id number]  USE UP TO FOUR TIMES DAILY [provider]  Active Self  albuterol (VENTOLIN HFA) 108 (90 Base) MCG/ACT inhaler [id number] Yes Inhale 2 puffs into the lungs every 4 (four) hours as needed for wheezing or shortness of breath. Raechel Chute, MD Taking Active Self  allopurinol (ZYLOPRIM) 300 MG tablet [id number] Yes Take 300 mg by mouth every morning. [provider] Taking Active Self  amiodarone (PACERONE) 200 MG tablet [id number] Yes Take 2 tablets (400 mg total) by mouth 2 (two) times daily for 14 days, THEN 1 tablet (200 mg total) 2 (two)  times daily for 14 days, THEN 1 tablet (200 mg total) 2 (two) times daily. Esaw Grandchild A, DO Taking Active   aspirin EC 81 MG tablet [id number] Yes Take 81 mg by mouth every morning. [provider] Taking Active Self  azelastine (ASTELIN) 0.1 % nasal spray [id number] Yes Place 2 sprays into both nostrils 2 (two) times daily. Use in each nostril as directed Sallee Provencal, FNP Taking Active Self  BD PEN NEEDLE NANO 2ND GEN 32G X 4 MM MISC [id number]  USE  AS DIRECTED AT BEDTIME Jacky Kindle, FNP  Active Self  blood glucose meter kit and supplies KIT [id number]  Dispense based on patient and insurance preference. Use up to four times daily as directed. Jacky Kindle, FNP  Active Self  buPROPion (WELLBUTRIN XL) 150 MG 24 hr tablet [id number] Yes Take 150 mg by mouth daily. [provider] Taking Active Self  butalbital-acetaminophen-caffeine (FIORICET) 50-325-40 MG tablet [id number] Yes Take 1 tablet by mouth every 4 (four) hours as needed for migraine. [provider] Taking Active Self  carvedilol (COREG) 3.125 MG tablet [id number] Yes Take 1 tablet (3.125 mg total) by mouth 2 (two) times daily with a meal. Pennie Banter, DO Taking Active   cetirizine (ZYRTEC)  10 MG tablet [id number] Yes Take 1 tablet (10 mg total) by mouth every morning. Poggi, Excell Seltzer, MD Taking Active Self  colchicine 0.6 MG tablet [id number]  Take 1 tablet (0.6 mg total) by mouth daily as needed (gout flare). Poggi, Excell Seltzer, MD  Active Self  Continuous Glucose Sensor (FREESTYLE LIBRE 3 SENSOR) [place] [id number]  PLACE 1 SENSOR ON TO THE SKIN EVERY 14 DAYS. USE TO CHECK BLOOD SUGAR CONTINUEOUSLY. [provider]  Active Self  Continuous Glucose Sensor (FREESTYLE LIBRE 3 SENSOR) MISC [id number]  1 Application by Does not apply route every 14 (fourteen) days. Use to check blood sugar continuously. Jacky Kindle, FNP  Active Self  Continuous Glucose Sensor (FREESTYLE LIBRE 3 SENSOR) [place] [id number]   Apply sensor every 14 (fourteen) days to check blood sugar continuously. Sallee Provencal, FNP  Active Self  dapagliflozin propanediol (FARXIGA) 10 MG TABS tablet [id number] Yes Take 1 tablet (10 mg total) by mouth daily. Delma Freeze, FNP Taking Active Self  fenofibrate (TRICOR) 145 MG tablet [id number] Yes Take 1 tablet (145 mg total) by mouth every evening. Poggi, Excell Seltzer, MD Taking Active Self  FLUoxetine (PROZAC) 20 MG capsule [id number] Yes Take 1 capsule (20 mg total) by mouth every evening. Poggi, Excell Seltzer, MD Taking Active Self  fluticasone Aleda Grana) 50 MCG/ACT nasal spray [id number] Yes Place 2 sprays into both nostrils daily as needed for allergies or rhinitis. Home med. Poggi, Excell Seltzer, MD Taking Active Self  Galcanezumab-gnlm 120 MG/ML Ivory Broad [id number] Yes Inject 120 mg into the skin. Monthly [provider] Taking Active Self  Insulin Glargine (BASAGLAR KWIKPEN) 100 UNIT/ML [id number] Yes Inject 36 Units into the skin at bedtime. Jacky Kindle, FNP Taking Active Self  insulin lispro (HUMALOG) 100 UNIT/ML injection [id number] Yes Inject 10 Units into the skin 3 (three) times daily before meals. [provider] Taking Active Self  Insulin Pen Needle 32G X 4 MM MISC [id number]  1 each by Does not apply route in the morning, at noon, in the evening, and at bedtime. Jacky Kindle, FNP  Active Self  ipratropium-albuterol (DUONEB) 0.5-2.5 (3) MG/3ML SOLN [id number]  Take 3 mLs by nebulization every 6 (six) hours as needed. Sallee Provencal, FNP  Active Self  isosorbide mononitrate (IMDUR) 30 MG 24 hr tablet [id number]  Take 0.5 tablets (15 mg total) by mouth every morning. Poggi, Excell Seltzer, MD  Active Self  meclizine (ANTIVERT) 25 MG tablet [id number]  Take 0.5 tablets (12.5 mg total) by mouth 3 (three) times daily as needed for dizziness. Poggi, Excell Seltzer, MD  Active Self  nitroGLYCERIN (NITROSTAT) 0.4 MG SL tablet [id number]  Place 1 tablet (0.4 mg total) under the tongue every 5 (five) minutes  x 3 doses as needed for chest pain. Jacky Kindle, FNP  Active Self  omeprazole (PRILOSEC) 40 MG capsule [id number]  Take 1 capsule (40 mg total) by mouth every morning. Poggi, Excell Seltzer, MD  Active Self  potassium chloride SA (KLOR-CON M) 20 MEQ tablet [id number] Yes Take 1 tablet (20 mEq total) by mouth every other day. AM Poggi, Excell Seltzer, MD Taking Active Self  Resmetirom (REZDIFFRA) 100 MG TABS [id number] Yes TAKE 1 TABLET BY MOUTH DAILY WITH OR WITHOUT FOOD Vanga, [name], MD Taking Active   rivaroxaban (XARELTO) 20 MG TABS tablet [id number] Yes Take 1 tablet (20 mg total) by mouth daily with supper. Bensimhon, Bevelyn Buckles, MD Taking Active Self  rosuvastatin (CRESTOR) 20 MG tablet [id number] Yes  Take 1 tablet (20 mg total) by mouth at bedtime. Poggi, Excell Seltzer, MD Taking Active Self  sacubitril-valsartan (ENTRESTO) 24-26 [place] [id number] Yes Take 1 tablet by mouth 2 (two) times daily. Pennie Banter, DO Taking Active   Semaglutide, 2 MG/DOSE, (OZEMPIC, 2 MG/DOSE,) 8 MG/3ML SOPN [id number] Yes Inject 2 mg into the skin once a week. Tuesdays Christena Flake, MD Taking Active Self  spironolactone (ALDACTONE) 25 MG tablet [id number] Yes Take 1 tablet by mouth once daily Delma Freeze, FNP Taking Active Self  TALTZ 80 MG/ML SOAJ [id number] Yes Inject 3 mLs into the skin every 28 (twenty-eight) days. [provider] Taking Active Self           Med Note Lenoria Farrier   Fri Aug 08, 2023  3:20 PM)    torsemide (DEMADEX) 20 MG tablet [id number] Yes Take 1 tablet (20 mg total) by mouth every morning. Poggi, Excell Seltzer, MD Taking Active Self  umeclidinium-vilanterol [facility] ELLIPTA) 62.5-25 MCG/ACT AEPB [id number] Yes Inhale 1 puff into the lungs every morning. Poggi, Excell Seltzer, MD Taking Active Self              Assessment/Plan:   Comprehensive medication review performed; medication list updated in electronic medical record  Send message to PCP/office to request renewal of Ozempic prescription as  requested by patient  Patient to contact pharmacy to request refill of isosorbide prescription   Diabetes: - Controlled - Reviewed long term cardiovascular and renal outcomes of uncontrolled blood sugar - Have counseled patient on s/s of low blood sugar and how to treat lows Encouraged patient to obtain glucose tablets to carry with him - Recommend to continue to check glucose continuously using CGM and contact office if needed for readings outside of established parameters or for symptoms - As Freestyle Libre 3 CGM is being discontinued by manufacturer this year, will send prescription to pharmacy (per protocol) to switch patient to [employer] 3 Plus sensors. Sent to [facility] as requested  Counsel that [employer] 3 Plus sensors each last 15 days     Heart Failure/Hypertension/Atrial Fibrillation: - Send message to Dr. Lalla Brothers and Dr. Elberta Fortis to request clarification for directions for patient's amiodarone prescription Note on 11/07/2023, patient was discharged with amiodarone 200 mg prescription with direction: "Take 2 tablets (400 mg total) by mouth 2 (two) times daily for 14 days, THEN 1 tablet (200 mg total) 2 (two) times daily for 14 days, THEN 1 tablet (200 mg total) 2 (two) times daily" However, from review of note from Dr. Elberta Fortis on 11/07/2023, provider recommended "Transition to PO amiodarone 400mg  amiodarone BID x 2 weeks, then 400mg  daily x 2 weeks, then 200mg  daily" - Encourage patient to take positional changes slowly - Recommend patient to continue to monitor his home blood pressure and daily weight and contact office if needed for readings outside of established parameters or for symptoms   COPD: - Reviewed appropriate inhaler technique.       Follow Up Plan: Clinical Pharmacist will follow up with patient by telephone again on 01/07/2024 at 9:00 AM     Estelle Grumbles, PharmD, [facility] [phone]

## 2023-11-20 ENCOUNTER — Ambulatory Visit: Admitting: Physician Assistant

## 2023-11-20 ENCOUNTER — Telehealth: Payer: Self-pay

## 2023-11-20 VITALS — BP 117/70 | HR 74 | Resp 16 | Ht 71.0 in | Wt 230.0 lb

## 2023-11-20 DIAGNOSIS — G4733 Obstructive sleep apnea (adult) (pediatric): Secondary | ICD-10-CM

## 2023-11-20 DIAGNOSIS — Z09 Encounter for follow-up examination after completed treatment for conditions other than malignant neoplasm: Secondary | ICD-10-CM | POA: Diagnosis not present

## 2023-11-20 DIAGNOSIS — F339 Major depressive disorder, recurrent, unspecified: Secondary | ICD-10-CM

## 2023-11-20 DIAGNOSIS — I4891 Unspecified atrial fibrillation: Secondary | ICD-10-CM | POA: Diagnosis not present

## 2023-11-20 DIAGNOSIS — E0822 Diabetes mellitus due to underlying condition with diabetic chronic kidney disease: Secondary | ICD-10-CM

## 2023-11-20 DIAGNOSIS — I428 Other cardiomyopathies: Secondary | ICD-10-CM

## 2023-11-20 DIAGNOSIS — Z794 Long term (current) use of insulin: Secondary | ICD-10-CM

## 2023-11-20 DIAGNOSIS — J449 Chronic obstructive pulmonary disease, unspecified: Secondary | ICD-10-CM

## 2023-11-20 DIAGNOSIS — N1832 Chronic kidney disease, stage 3b: Secondary | ICD-10-CM

## 2023-11-20 DIAGNOSIS — N1831 Chronic kidney disease, stage 3a: Secondary | ICD-10-CM

## 2023-11-20 MED ORDER — FREESTYLE LIBRE 3 SENSOR MISC
11 refills | Status: DC
Start: 2023-11-20 — End: 2024-02-17

## 2023-11-20 MED ORDER — OZEMPIC (2 MG/DOSE) 8 MG/3ML ~~LOC~~ SOPN: 2.0000 mg | PEN_INJECTOR | SUBCUTANEOUS | 2 refills | Status: DC

## 2023-11-20 NOTE — Progress Notes (Signed)
 Established patient visit  Patient: Gabriel [name].   DOB: [birth year]   53 y.o. Male  MRN: [id number] Visit Date: 11/20/2023  Today's healthcare provider: Debera Lat, PA-C   Chief Complaint  Patient presents with   Hospitalization Follow-up    Hospital f/u   Subjective      Discussed the use of AI scribe software for clinical note transcription with the patient, who gave verbal consent to proceed.  History of Present Illness The patient, with a history of atrial fibrillation, COPD, diabetes, sleep apnea, and depression/anxiety, presents for a follow-up after a recent hospital stay. The patient reports feeling significantly better since his heart was "reset" in the hospital, noting improvements in breathing and fatigue. He reports a significant weight loss of 20 pounds within a month, which he attributes to changes in diet and medication. The patient also mentions a recent surgery for a torn tendon, which is currently undergoing physical therapy.  The patient's diabetes is managed with Ozempic (semaglutide) and insulin, but he expresses a desire to discontinue certain medications he feels are no longer necessary. Specifically, he wishes to stop taking Wellbutrin and sertraline, previously prescribed for depression and anxiety, as he feels his anxiety has resolved since understanding his heart condition better. The patient also reports seasonal allergies, which he manages with Zyrtec and Flonase.  Follow up Hospitalization  Patient was admitted to [facility] on 11/04/23 and discharged on 11/07/23. He was treated for  A-fib. Treatment for this included underwent cardioversion 3/20. Telephone follow up was done on 11/10/23 He reports fair compliance with treatment. He reports this condition is improved.  I see that you have a post-hospitalization visit with patient tomorrow. I reviewed medications with him today.      11/20/2023   10:46 AM 04/16/2023    11:04 AM 03/11/2023    1:45 PM  Depression screen PHQ 2/9  Decreased Interest 0 3 0  Down, Depressed, Hopeless 0 3 1  PHQ - 2 Score 0 6 1  Altered sleeping 0 1   Tired, decreased energy 0 3   Change in appetite 3 2   Feeling bad or failure about yourself  0 3   Trouble concentrating 0 2   Moving slowly or fidgety/restless 0 2   Suicidal thoughts 0 1   PHQ-9 Score 3 20   Difficult doing work/chores Not difficult at all Somewhat difficult       11/20/2023   10:46 AM 04/16/2023   11:06 AM 06/07/2022    8:45 AM 05/23/2022    8:44 AM  GAD 7 : Generalized Anxiety Score  Nervous, Anxious, on Edge 0 2 0 1  Control/stop worrying 0 2 0 1  Worry too much - different things 0 3 0 1  Trouble relaxing 0 3 0 1  Restless 0 1 0 0  Easily annoyed or irritable 0 3 0 0  Afraid - awful might happen 0 3 0 0  Total GAD 7 Score 0 17 0 4  Anxiety Difficulty Not difficult at all Somewhat difficult Not difficult at all Somewhat difficult    Medications: Outpatient Medications Prior to Visit  Medication Sig   Accu-Chek Softclix Lancets lancets USE UP TO FOUR TIMES DAILY   albuterol (VENTOLIN HFA) 108 (90 Base) MCG/ACT inhaler Inhale 2 puffs into the lungs every 4 (four) hours as needed for wheezing or shortness of breath.   allopurinol (ZYLOPRIM) 300 MG tablet Take 300 mg by mouth every morning.  amiodarone (PACERONE) 200 MG tablet Take 2 tablets (400 mg total) by mouth 2 (two) times daily for 14 days, THEN 1 tablet (200 mg total) 2 (two) times daily for 14 days, THEN 1 tablet (200 mg total) 2 (two) times daily.   aspirin EC 81 MG tablet Take 81 mg by mouth every morning.   azelastine (ASTELIN) 0.1 % nasal spray Place 2 sprays into both nostrils 2 (two) times daily. Use in each nostril as directed   BD PEN NEEDLE NANO 2ND GEN 32G X 4 MM MISC USE  AS DIRECTED AT BEDTIME   blood glucose meter kit and supplies KIT Dispense based on patient and insurance preference. Use up to four times daily as directed.    buPROPion (WELLBUTRIN XL) 150 MG 24 hr tablet Take 150 mg by mouth daily.   butalbital-acetaminophen-caffeine (FIORICET) 50-325-40 MG tablet Take 1 tablet by mouth every 4 (four) hours as needed for migraine.   carvedilol (COREG) 3.125 MG tablet Take 1 tablet (3.125 mg total) by mouth 2 (two) times daily with a meal.   cetirizine (ZYRTEC) 10 MG tablet Take 1 tablet (10 mg total) by mouth every morning.   colchicine 0.6 MG tablet Take 1 tablet (0.6 mg total) by mouth daily as needed (gout flare).   Continuous Glucose Sensor (FREESTYLE LIBRE 3 PLUS SENSOR) MISC Place 1 sensor on the skin every 15 days. Use to check glucose continuously   dapagliflozin propanediol (FARXIGA) 10 MG TABS tablet Take 1 tablet (10 mg total) by mouth daily.   fenofibrate (TRICOR) 145 MG tablet Take 1 tablet (145 mg total) by mouth every evening.   FLUoxetine (PROZAC) 20 MG capsule Take 1 capsule (20 mg total) by mouth every evening.   fluticasone (FLONASE) 50 MCG/ACT nasal spray Place 2 sprays into both nostrils daily as needed for allergies or rhinitis. Home med.   Galcanezumab-gnlm 120 MG/ML SOAJ Inject 120 mg into the skin. Monthly   Insulin Glargine (BASAGLAR KWIKPEN) 100 UNIT/ML Inject 36 Units into the skin at bedtime.   insulin lispro (HUMALOG) 100 UNIT/ML injection Inject 10 Units into the skin 3 (three) times daily before meals.   Insulin Pen Needle 32G X 4 MM MISC 1 each by Does not apply route in the morning, at noon, in the evening, and at bedtime.   ipratropium-albuterol (DUONEB) 0.5-2.5 (3) MG/3ML SOLN Take 3 mLs by nebulization every 6 (six) hours as needed.   isosorbide mononitrate (IMDUR) 30 MG 24 hr tablet Take 0.5 tablets (15 mg total) by mouth every morning.   meclizine (ANTIVERT) 25 MG tablet Take 0.5 tablets (12.5 mg total) by mouth 3 (three) times daily as needed for dizziness.   nitroGLYCERIN (NITROSTAT) 0.4 MG SL tablet Place 1 tablet (0.4 mg total) under the tongue every 5 (five) minutes x 3  doses as needed for chest pain.   omeprazole (PRILOSEC) 40 MG capsule Take 1 capsule (40 mg total) by mouth every morning.   potassium chloride SA (KLOR-CON M) 20 MEQ tablet Take 1 tablet (20 mEq total) by mouth every other day. AM   Resmetirom (REZDIFFRA) 100 MG TABS TAKE 1 TABLET BY MOUTH DAILY WITH OR WITHOUT FOOD   rivaroxaban (XARELTO) 20 MG TABS tablet Take 1 tablet (20 mg total) by mouth daily with supper.   rosuvastatin (CRESTOR) 20 MG tablet Take 1 tablet (20 mg total) by mouth at bedtime.   sacubitril-valsartan (ENTRESTO) 24-26 MG Take 1 tablet by mouth 2 (two) times daily.   spironolactone (ALDACTONE) 25  MG tablet Take 1 tablet by mouth once daily   TALTZ 80 MG/ML SOAJ Inject 3 mLs into the skin every 28 (twenty-eight) days.   torsemide (DEMADEX) 20 MG tablet Take 1 tablet (20 mg total) by mouth every morning.   umeclidinium-vilanterol (ANORO ELLIPTA) 62.5-25 MCG/ACT AEPB Inhale 1 puff into the lungs every morning.   [DISCONTINUED] Semaglutide, 2 MG/DOSE, (OZEMPIC, 2 MG/DOSE,) 8 MG/3ML SOPN Inject 2 mg into the skin once a week. Tuesdays   No facility-administered medications prior to visit.    Review of Systems  All other systems reviewed and are negative.  All negative Except see HPI   {Insert previous labs (optional):23779} {See past labs  Heme  Chem  Endocrine  Serology  Results Review (optional):1}   Objective    BP 117/70 (BP Location: Right Arm, Patient Position: Sitting, Cuff Size: Normal)   Pulse 74   Resp 16   Ht 5\' 11"  (1.803 m)   Wt 230 lb (104.3 kg)   SpO2 97%   BMI 32.08 kg/m  {Insert last BP/Wt (optional):23777}{See vitals history (optional):1}   Physical Exam Vitals reviewed.  Constitutional:      General: He is not in acute distress.    Appearance: Normal appearance. He is not diaphoretic.  HENT:     Head: Normocephalic and atraumatic.  Eyes:     General: No scleral icterus.    Conjunctiva/sclera: Conjunctivae normal.  Cardiovascular:      Rate and Rhythm: Normal rate and regular rhythm.     Pulses: Normal pulses.     Heart sounds: Normal heart sounds. No murmur heard. Pulmonary:     Effort: Pulmonary effort is normal. No respiratory distress.     Breath sounds: Normal breath sounds. No wheezing or rhonchi.  Musculoskeletal:     Cervical back: Neck supple.     Right lower leg: No edema.     Left lower leg: No edema.  Lymphadenopathy:     Cervical: No cervical adenopathy.  Skin:    General: Skin is warm and dry.     Findings: No rash.  Neurological:     Mental Status: He is alert and oriented to person, place, and time. Mental status is at baseline.  Psychiatric:        Mood and Affect: Mood normal.        Behavior: Behavior normal.      No results found for any visits on 11/20/23.      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 Use to check glucose continuously  Dispense: 2 each; Refill: 11 - Semaglutide, 2 MG/DOSE, (OZEMPIC, 2 MG/DOSE,) 8 MG/3ML SOPN; Inject 2 mg into the skin once a week. Tuesdays  Dispense: 3 mL; Refill: 2  OSA on CPAP Depression, recurrent (HCC)    11/20/2023   10:46 AM 04/16/2023   11:04 AM 03/11/2023    1:45 PM  PHQ9 SCORE ONLY  PHQ-9 Total Score 3 20 1   Denies having depression and stop taking fluoxetine and bupropion Requested removal of both medications from his med list.  Will follow-up  Atrial Fibrillation NICM Hospitalized for atrial fibrillation, started on Xarelto and amiodarone. Reports improvement post-cardioversion. Under cardiologist care,  see note from 11/13/23 - Continue Xarelto and  amiodarone, entresto, torsemide, aldactone, farxiga as prescribed. - Follow up with cardiologist as scheduled.   7: AF- - EKG today is NSR; denies any more chest pain since cardioversion - cardioverted 11/06/23 - continue amiodarone 400mg  BID taper - continue ASA 81mg  daily - continue xarelto 20mg  daily Diabetes Mellitus Type 2 On Ozempic, insulin 36 units, and [place]. Recent blood sugar 136 mg/dL, non-fasting. Experienced weight loss, beneficial but should be monitored to avoid rapid weight loss complications. - Refill Ozempic prescription. - Continue insulin and Farxiga as prescribed. - Monitor blood sugar levels. - Encourage gradual weight loss. Advised low carb diet and regular exercise as toleratedcontinue rosuvastatin 20 and fenofibrate Will follow-up  5.  DM2- - saw nephrology (Korrapati) 08/23  - A1c 07/11/23 reviewed and was 6.6%    2.  CAD/Elevated Troponin and chronic CP:  - H/o CAD  -  RHC/ LHC 08/19/23:   - continue rosuvastatin 20mg  daily - continue isosorbide MN 15mg  daily - saw cardiology Fransico Michael) 01/25 - continue carvedilol 3.125mg  BID - continue Farxiga 10mg  daily - continue Entresto 24/26mg  bid - continue spiro 25mg  daily - continue torsemide 20mg  daily  - continue potassium every other day - CBC, BMP, BNP, Mg today Chronic Kidney Disease Kidney function slightly reduced with GFR of 52. - Avoid NSAIDs such as ibuprofen and Aleve. - Encourage hydration with 8-12 glasses of water daily.  Chronic Obstructive Pulmonary Disease (COPD) Reports improvement in breathing and no longer feels fatigued. - Continue current COPD management.  Sleep Apnea Uses CPAP nightly. No new issues reported. - Continue using CPAP nightly.  Tendon Repair Underwent tendon repair surgery, currently in physical therapy. - Continue physical therapy for arm rehabilitation.  Per pharmacy, there should be clarification regarding amiodarone prescription from  cardiology.  General Health Maintenance Advised to manage seasonal allergies with antihistamines and nasal sprays. Advised to monitor weight and avoid rapid weight loss to prevent complications. - Continue antihistamines and Flonase for seasonal allergies. - Monitor weight and avoid rapid weight loss.  Follow-up Scheduled to follow up in six weeks to assess response to Ozempic and overall health status. Under care of cardiologist and other specialists. - Schedule follow-up appointment in six weeks. - Continue follow-up with cardiologist and other specialists as scheduled.  Home Ozempic will not be resumed on admission     No orders of the defined types were placed in this encounter.   Return in about 6 weeks (around 01/01/2024) for chronic disease f/u.   The patient was advised to call back or seek an in-person evaluation if the symptoms worsen or if the condition fails to improve as anticipated.  I discussed the assessment and treatment plan with the patient. The patient was provided an opportunity to ask questions and all were answered. The patient agreed with the plan and demonstrated an understanding of the instructions.  I, Debera Lat, PA-C have reviewed all documentation for this visit. The documentation on 11/20/2023  for the exam, diagnosis, procedures, and orders are all accurate and complete.  Debera Lat, [facility], MMS [facility] [phone] (phone) [phone] (fax)  [facility]

## 2023-11-20 NOTE — Telephone Encounter (Signed)
 The Freestyle Libre 2 and 3 sensors are being discontinued September 2025. We are highly encouraging Dr's to go ahead and switch to the [place] 2 plus or the [place] 3 plus instead of doing the prior authorization for the old ones.

## 2023-11-21 ENCOUNTER — Other Ambulatory Visit: Payer: Self-pay | Admitting: Pharmacist

## 2023-11-21 ENCOUNTER — Encounter: Payer: Self-pay | Admitting: Surgery

## 2023-11-21 DIAGNOSIS — I4891 Unspecified atrial fibrillation: Secondary | ICD-10-CM

## 2023-11-21 NOTE — Progress Notes (Signed)
 Regan Lemming, MD  Ronney Asters, Jackelyn Poling, RPH-CPP; Lanier Prude, MD 400 mg BID x2 weeks, 400 mg daily x2 weeks, 200 mg daily. Thank you.       Previous Messages    ----- Message ----- From: Manuela Neptune, RPH-CPP Sent: 11/19/2023  11:08 AM EDT To: Regan Lemming, MD; Lanier Prude, MD  Good Morning,  During review of medications with patient today, note at hospital discharge on 11/07/2023, he was discharged with amiodarone 200 mg prescription with direction: "Take 2 tablets (400 mg total) by mouth 2 (two) times daily for 14 days, THEN 1 tablet (200 mg total) 2 (two) times daily for 14 days, THEN 1 tablet (200 mg total) 2 (two) times daily"  However, from review of note from Dr. Elberta Fortis on 11/07/2023, provider recommended "Transition to PO amiodarone 400mg  amiodarone BID x 2 weeks, then 400mg  daily x 2 weeks, then 200mg  daily Follow-up in 6 weeks in clinic"  Patient is currently still on initial step of these directions (taking amiodarone 400 mg twice daily). However, would you please confirm the final/maintenance part of the direction for patient?  Thank you!  Estelle Grumbles, PharmD, [facility] [phone]

## 2023-11-21 NOTE — Progress Notes (Signed)
   11/21/2023  Patient ID: [name]., male   DOB: 01/26/1971, 53 y.o.   MRN: [id number]  Follow up with patient to provide update from Dr. Elberta Fortis. Patient verbalizes understanding via teach back of plan from Dr. Elberta Fortis to take amiodarone 400 mg BID x2 weeks, then 400 mg daily x2 weeks, then 200 mg daily.  Denies further medication questions or concerns.  Estelle Grumbles, PharmD, [facility] [phone]

## 2023-11-21 NOTE — Patient Instructions (Signed)
Check your blood pressure once daily, and any time you have concerning symptoms like headache, chest pain, dizziness, shortness of breath, or vision changes.   To appropriately check your blood pressure, make sure you do the following:  1) Avoid caffeine, exercise, or tobacco products for 30 minutes before checking. Empty your bladder. 2) Sit with your back supported in a flat-backed chair. Rest your arm on something flat (arm of the chair, table, etc). 3) Sit still with your feet flat on the floor, resting, for at least 5 minutes.  4) Check your blood pressure. Take 1-2 readings.  5) Write down these readings and bring with you to any provider appointments.  Bring your home blood pressure machine with you to a provider's office for accuracy comparison at least once a year.   Make sure you take your blood pressure medications before you come to any office visit, even if you were asked to fast for labs.  Fredie Majano Rozalia Dino, PharmD, BCACP [facility] [phone]  

## 2023-11-22 ENCOUNTER — Encounter: Payer: Self-pay | Admitting: Cardiology

## 2023-11-23 ENCOUNTER — Encounter: Payer: Self-pay | Admitting: Physician Assistant

## 2023-11-24 DIAGNOSIS — G8929 Other chronic pain: Secondary | ICD-10-CM | POA: Diagnosis not present

## 2023-11-24 DIAGNOSIS — M25512 Pain in left shoulder: Secondary | ICD-10-CM | POA: Diagnosis not present

## 2023-11-24 MED ORDER — FREESTYLE LIBRE 3 PLUS SENSOR MISC
12 refills | Status: DC
  Filled 2023-12-12: qty 2, 30d supply, fill #0
  Filled 2023-12-12: qty 2, 28d supply, fill #0
  Filled 2024-01-09: qty 2, 30d supply, fill #1

## 2023-11-26 DIAGNOSIS — I10 Essential (primary) hypertension: Secondary | ICD-10-CM | POA: Diagnosis not present

## 2023-11-26 DIAGNOSIS — G4733 Obstructive sleep apnea (adult) (pediatric): Secondary | ICD-10-CM | POA: Diagnosis not present

## 2023-11-27 DIAGNOSIS — G8929 Other chronic pain: Secondary | ICD-10-CM | POA: Diagnosis not present

## 2023-11-27 DIAGNOSIS — M25512 Pain in left shoulder: Secondary | ICD-10-CM | POA: Diagnosis not present

## 2023-11-27 DIAGNOSIS — M25612 Stiffness of left shoulder, not elsewhere classified: Secondary | ICD-10-CM | POA: Diagnosis not present

## 2023-11-27 DIAGNOSIS — M6281 Muscle weakness (generalized): Secondary | ICD-10-CM | POA: Diagnosis not present

## 2023-11-27 DIAGNOSIS — Z9889 Other specified postprocedural states: Secondary | ICD-10-CM | POA: Diagnosis not present

## 2023-12-01 DIAGNOSIS — G8929 Other chronic pain: Secondary | ICD-10-CM | POA: Diagnosis not present

## 2023-12-01 DIAGNOSIS — M25512 Pain in left shoulder: Secondary | ICD-10-CM | POA: Diagnosis not present

## 2023-12-01 DIAGNOSIS — M25612 Stiffness of left shoulder, not elsewhere classified: Secondary | ICD-10-CM | POA: Diagnosis not present

## 2023-12-01 DIAGNOSIS — Z9889 Other specified postprocedural states: Secondary | ICD-10-CM | POA: Diagnosis not present

## 2023-12-01 DIAGNOSIS — M6281 Muscle weakness (generalized): Secondary | ICD-10-CM | POA: Diagnosis not present

## 2023-12-03 ENCOUNTER — Other Ambulatory Visit: Payer: Self-pay

## 2023-12-03 DIAGNOSIS — M25612 Stiffness of left shoulder, not elsewhere classified: Secondary | ICD-10-CM | POA: Diagnosis not present

## 2023-12-03 DIAGNOSIS — G8929 Other chronic pain: Secondary | ICD-10-CM | POA: Diagnosis not present

## 2023-12-03 DIAGNOSIS — M25512 Pain in left shoulder: Secondary | ICD-10-CM | POA: Diagnosis not present

## 2023-12-03 DIAGNOSIS — M6281 Muscle weakness (generalized): Secondary | ICD-10-CM | POA: Diagnosis not present

## 2023-12-03 DIAGNOSIS — Z9889 Other specified postprocedural states: Secondary | ICD-10-CM | POA: Diagnosis not present

## 2023-12-04 ENCOUNTER — Telehealth: Payer: Self-pay | Admitting: Family

## 2023-12-04 NOTE — Telephone Encounter (Incomplete)
 Called to confirm/remind patient of their appointment at the [facility] on 12/05/23***.   Appointment:   [x] Confirmed  [] Left mess   [] No answer/No voice mail  [] Phone not in service  Patient reminded to bring all medications and/or complete list.  Confirmed patient has transportation. Gave directions, instructed to utilize valet parking.

## 2023-12-04 NOTE — Progress Notes (Signed)
 [facility] - PHARMACIST COUNSELING NOTE  Adherence Assessment  Do you ever forget to take your medication? [] Yes [x] No  Do you ever skip doses due to side effects? [] Yes [x] No  Do you have trouble affording your medicines? [] Yes [x] No  Are you ever unable to pick up your medication due to transportation difficulties? [] Yes [x] No  Do you ever stop taking your medications because you don't believe they are helping? [] Yes [x] No  Do you check your weight daily? [x] Yes [] No  Do you check your blood pressure daily? [x] Yes [] No  Adherence strategy: Pill box  Barriers to obtaining medications:    Vital signs: HR 78, BP 114/63, weight 233 lbs.  ECHO: Date 11/04/2023, EF 35-40%  Renal function: Date 11/13/23, GFR 52  Current Guideline-Directed Medical Therapy/Evidence Based Medicine  ACE/ARB/ARNI: Sacubitril -valsartan  24-26 mg twice daily Target dose: 97/103 BID   Beta Blocker: Carvedilol  3.125 mg twice daily Target dose: 50 mg BID   Aldosterone Antagonist: Spironolactone  25 mg daily Target dose: 50 mg daily    SGLT2i: Dapagliflozin  10 mg daily Target dose: On target dose   Diuretic: Torsemide  20 mg daily / KCl 20 mEq every other day  ASSESSMENT 53 year old male who presents to the HF clinic for a follow-up appointment. PMH is significant for  atrial fibrillation (03/25), HTN, T2DM, chronic chest pain, chronic troponin elevation, COPD, CKD, asthma, NSVT, OSA, unprovoked PE, pulmonary HTN, psoriasis, migraines, cirrhosis from NASH, ICD placement (10/23), previous tobacco use and chronic heart failure. Had left shoulder arthroscopy with debridement, decompression, rotator cuff repair and biceps tenodesis done 09/16/23.   Recent ED visit or hospitalization (past 6 months): Date: 11/03/2023 - 11/07/2023, CC: tachycardia , Admission Dx: atrial fibrillation with RVR Date: 10/22/2023 - 10/24/2023, CC: SOB, Admission Dx: COPD exacerbation  Date:  07/22/2023, CC: cough  Date: 06/09/2023, CC: SOB & chest pain   PLAN Continue current regimen as directed by NP Will plan to increase torsemide  to 40 mg daily x 3 days, then back to 20 mg daily    Time spent: 20 minutes  Evonnie Nieves, PharmD Pharmacy Resident  12/04/2023 10:57 PM

## 2023-12-04 NOTE — Progress Notes (Signed)
 [facility] Note    PCP: Ostwalt, Janna, PA (last seen 04/25) Primary Cardiologist: Gollan, Timothy, MD (last seen 07/24; returns 05/25) HF cardiologist: Cherrie Sieving, MD (last seen 09/24)  Chief Complaint: shortness of breath  HPI:  Gabriel [name] is a 53 y/o male with a history of atrial fibrillation (03/25), HTN, T2DM, chronic chest pain, chronic troponin elevation, COPD, CKD, asthma, NSVT, OSA, unprovoked PE, pulmonary HTN, psoriasis, migraines, cirrhosis from NASH, ICD placement (10/23), previous tobacco use and chronic heart failure. Had left shoulder arthroscopy with debridement, decompression, rotator cuff repair and biceps tenodesis done 09/16/23.   RHC/LHC done 06/13/20 and showed: Severe single-vessel coronary artery disease with chronic total occlusion of the proximal RCA.  The distal vessel fills via left to right collaterals. Mild to moderate, nonobstructive CAD involving the LAD and LCx with up to 50% stenosis. Mildly elevated left and right heart filling pressures. Moderate pulmonary hypertension (mean PAP 40 mmHg) with significantly elevated pulmonary vascular resistance (PVR 7.4 WU). Severely reduced Fick cardiac output/index.  Echo 05/21/21: EF of 40-45% along with mild LVH and mild Gabriel.   Admitted 09/2021 & 11/2021 with HF.   Echo 11/24/21: EF of 20-25% along with mild LVH and mild Gabriel.  Echo 02/06/22: EF of 30-35% along with mild/moderate AR.   Admitted 05/20/22 for ICD placement and discharged the next day. Was in the ED 12/21/22 due to dizziness & chest pain.   Echo 08/14/22: EF 40-45% with mild LVH, Grade I DD, severe hypokinesis of left ventricular, entire inferior and inferolateral wall.  Echo 03/08/23: EF 45-50% with moderate LVH, trivial Gabriel, mild dilatation of aortic root at 43 mm  Was in the ED 06/09/23 due to SOB/ chest pain for previous 2 days. CXR concerning for pneumonia so antibiotics provided. Initial troponin in the 50's which is close to  his baseline. EKG without ischemic changes.   RHC/ LHC 08/19/23:  Relatively stable appearance of the coronary arteries since last catheterization in 2021.  Chronically occluded proximal RCA now has antegrade flow, though I suspect this represents bridging collaterals.  Left-to-right collateral remain as well as moderate LAD and LCx disease. Mildly elevated left heart filling pressures (LVEDP 20 mmHg, PCWP 18 mmHg). Moderate pulmonary hypertension (mean PA 41 mmHg, PVR 5.3 WU). Moderately reduced Fick cardiac output/index (CO 4.3 L/min, CI 1.9 L/min/m^2).  Admitted 10/22/23 due to COPD exacerbation secondary to influenza.   Admitted 11/03/23 with dizziness, tachycardia and new onset AF. Echo 3/18 -- EF 35-40%, global LV hypokinesis, severely dilated LV internal cavity size, mild LVH, small pericardial effusion, mild Gabriel. IV diuresed, GDMT restarted. Successful cardioversion 11/06/23. Amiodarone  loaded.    He presents today for HF visit with a chief complaint of shortness of breath (worsening). Has associated fatigue, slight weight gain and episodes of elevated HR with occasional times at night where the HR goes up to upper 90's/ low 100's. He says that these symptoms have been worsening over the last couple of weeks. He denies dizziness, abdominal distention or pedal edema.   ROS: All systems negative except as listed in HPI, PMH and Problem List.  SH:  Social History   Socioeconomic History   Marital status: Widowed    Spouse name: Not on file   Number of children: 1   Years of education: 13   Highest education level: 12th grade  Occupational History   Occupation: disabled  Tobacco Use   Smoking status: Former    Current packs/day: 0.00    Average  packs/day: 0.5 packs/day for 33.0 years (16.5 ttl pk-yrs)    Types: Cigarettes    Start date: 05/09/1987    Quit date: 05/08/2020    Years since quitting: 3.5   Smokeless tobacco: Former    Quit date: 05/08/2020   Tobacco comments:    Quit  Sept 2021  Vaping Use   Vaping status: Former  Substance and Sexual Activity   Alcohol use: Not Currently   Drug use: No   Sexual activity: Not Currently  Other Topics Concern   Not on file  Social History Narrative   Lives at home with wife and his dad's wife.        Works sales/advanced research scientist (life sciences); smoker; cutting; hx of alcoholism [started 13 year]; quit at age of 22 years.    Social Drivers of Health   Financial Resource Strain: Medium Risk (08/05/2022)   Overall Financial Resource Strain (CARDIA)    Difficulty of Paying Living Expenses: Somewhat hard  Food Insecurity: No Food Insecurity (11/10/2023)   Hunger Vital Sign    Worried About Running Out of Food in the Last Year: Never true    Ran Out of Food in the Last Year: Never true  Transportation Needs: No Transportation Needs (11/10/2023)   PRAPARE - Administrator, Civil Service (Medical): No    Lack of Transportation (Non-Medical): No  Physical Activity: Insufficiently Active (01/20/2018)   Exercise Vital Sign    Days of Exercise per Week: 7 days    Minutes of Exercise per Session: 10 min  Stress: Stress Concern Present (09/02/2017)   [name] of Occupational Health - Occupational Stress Questionnaire    Feeling of Stress : Very much  Social Connections: Unknown (10/22/2023)   Social Connection and Isolation Panel [NHANES]    Frequency of Communication with Friends and Family: Not on file    Frequency of Social Gatherings with Friends and Family: Patient declined    Attends Religious Services: Patient declined    Active Member of Clubs or Organizations: Patient declined    Attends Banker Meetings: Patient declined    Marital Status: Patient declined  Intimate Partner Violence: Not At Risk (11/10/2023)   Humiliation, Afraid, Rape, and Kick questionnaire    Fear of Current or Ex-Partner: No    Emotionally Abused: No    Physically Abused: No    Sexually Abused: No    FH:  Family History   Problem Relation Age of Onset   Diabetes Mother    Diabetes Mellitus II Mother    Hypothyroidism Mother    Hypertension Mother    Kidney failure Mother        Dialysis   Heart attack Mother        35 yo approximately   Hypertension Father    Gout Father    Cancer Maternal Grandfather    Diabetes Maternal Grandfather    Cancer Paternal Aunt     Past Medical History:  Diagnosis Date   Aortic root dilation (HCC) 11/28/2016   a.) TTE 11/28/2016: 43 mm; b.) TTE 11/23/2019: 38 mm; c.) TTE 08/14/2022: 40 mm; d.) TTE 03/08/2023: 43 mm   Arteriosclerosis of right carotid artery    a.) carotid doppler 04/29/2017: <50% RICA   Bronchitis 07/2023   CAD (coronary artery disease)    a.) MV 04/2015: low risk; b.) LHC 01/06/2017: minor irregs in LAD/Diag/LCX/OM, RCA 40p/m/d; c.) R/LHC 06/13/2020: 50% dLAD, 30% p-mLCx, 40% OM1, 100% pRCA (L-R collats) - med mgmt; d.) [facility] 08/19/2023:  50% dLAD, 30% p-mLCx, 40% OM1, 100% pRCA (L-R collats), 50% p-mRCA - med mgmt   Chest wall pain, chronic    Chronic 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    Chronic Troponin Elevation    CKD (chronic kidney disease), stage II-III    COPD (chronic obstructive pulmonary disease) (HCC)    Depression    DM (diabetes mellitus), type 2 (HCC)    HFrEF (heart failure with reduced ejection fraction) (HCC)    a.) EF 03/20/2015: EF 45-50%; b.) MV 05/19/2015: EF 30-44%; c.) TTE 01/04/2016: EF 20-25%; d.) TTE 02/29/2016: EF 30-35%; e.) TTE 11/28/2016: EF 40-45%; f.) LHC 01/06/2017: EF 25-35%; g.) TTE 04/29/2017: EF 45-50%; h.) TTE 06/26/2019: EF 30-35%; i.) TTE 11/23/2019: EF 25-30%; j.) TTE 06/08/2020: EF 35-40%; k.) TTE 05/21/2021: EF 40-45%; l.) TTE 08/14/2022: EF 40-45%; m.) TTE 03/08/2023: EF 40-45%   History of 2019 novel coronavirus disease (COVID-19) 03/01/2022   History of methicillin resistant staphylococcus aureus (MRSA)    Hyperlipidemia    Hypertension    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 12/2015 noted on tele-->amio;  b. 12/2015 Event monitor: no VT noted.   Obesity (BMI 30.0-34.9)    On rivaroxaban  therapy    OSA treated with BiPAP    Polyp of descending colon    Psoriasis    Psoriatic arthritis (HCC)    a.) Tx'd with ixekizumab   Pulmonary HTN (HCC) 02/29/2016   a.) TTE 02/29/2016: RVSP 47; b.) TTE 06/26/2019: RVSP 58.4; c.) TTE 12/08/2019: RVSP 40.8; d.) TTE 06/08/2020: RVSP 44.7; e.) R/LHC 06/08/2020: mRA 9, mPA 40, mPWCP 17, AO sat 98, PA sat 61, CO 3.1, CI 1.3, PVR 7.4, LVEDP 20; f.) R/LHC 08/18/2020: mRA 5, mPA 41, mPCWP 18, AO sat 92, PA sat 59, CO 4.3, CI 1.9, PVR 5.3   Recurrent pulmonary emboli (HCC) 06/07/2020   06/07/20: small bilateral PEs.  12/31/19: RUL and RLL PEs.   Stroke [facility])    pt states per mri in 2023-unsure of when stroke happened   Syncope    a. 01/2016 - felt to be vasovagal.   Thrombocytopenia (HCC)     Current Outpatient Medications  Medication Sig Dispense Refill   Accu-Chek Softclix Lancets lancets USE UP TO FOUR TIMES DAILY     albuterol  (VENTOLIN  HFA) 108 (90 Base) MCG/ACT inhaler Inhale 2 puffs into the lungs every 4 (four) hours as needed for wheezing or shortness of breath. 8 g 6   allopurinol  (ZYLOPRIM ) 300 MG tablet Take 300 mg by mouth every morning.      amiodarone  (PACERONE ) 200 MG tablet Take 2 tablets (400 mg total) by mouth 2 (two) times daily for 14 days, THEN 1 tablet (200 mg total) 2 (two) times daily for 14 days, THEN 1 tablet (200 mg total) 2 (two)  times daily. 144 tablet 0   aspirin  EC 81 MG tablet Take 81 mg by mouth every morning.     azelastine  (ASTELIN ) 0.1 % nasal spray Place 2 sprays into both nostrils 2 (two) times daily. Use in each nostril as directed 30 mL 12   BD PEN NEEDLE NANO 2ND GEN 32G X 4 MM MISC USE  AS DIRECTED AT BEDTIME 200 each 0   blood glucose meter kit and supplies KIT Dispense based on patient and insurance preference. Use up to four times daily as directed. 1 each 11   buPROPion  (WELLBUTRIN  XL) 150 MG 24 hr tablet Take 150 mg by mouth daily.     butalbital -acetaminophen -caffeine  (FIORICET ) 50-325-40 MG tablet Take 1 tablet by mouth every 4 (four) hours as needed for migraine.     carvedilol  (COREG ) 3.125 MG tablet Take 1 tablet (3.125 mg total) by mouth 2 (two) times daily with a meal. 60 tablet 2   cetirizine  (ZYRTEC ) 10 MG tablet Take 1 tablet (10 mg total) by mouth every morning.     colchicine  0.6 MG tablet Take 1 tablet (0.6 mg total) by mouth daily as needed (gout flare).     Continuous Glucose Sensor (FREESTYLE LIBRE 3 PLUS SENSOR) MISC Place 1 sensor on the skin every 15 days. Use to check glucose continuously 2 each 12   Continuous Glucose Sensor (FREESTYLE LIBRE 3 SENSOR) MISC Place 1 sensor on the skin every 14 days. Use to check glucose continuously 2 each 11   dapagliflozin  propanediol (FARXIGA ) 10 MG TABS tablet Take 1 tablet (10 mg total) by mouth daily. 90 tablet 3   fenofibrate  (TRICOR ) 145 MG tablet Take 1 tablet (145 mg total) by mouth every evening.     FLUoxetine  (PROZAC ) 20 MG capsule Take 1 capsule (20 mg total) by mouth every evening.     fluticasone  (FLONASE ) 50 MCG/ACT nasal spray Place 2 sprays into both nostrils daily as needed for allergies or rhinitis. Home med.      Galcanezumab-gnlm 120 MG/ML SOAJ Inject 120 mg into the skin. Monthly     Insulin  Glargine (BASAGLAR  KWIKPEN) 100 UNIT/ML Inject 36 Units into the skin at bedtime. 45 mL 3   insulin  lispro (HUMALOG ) 100 UNIT/ML injection Inject 10 Units into the skin 3 (three) times daily before meals.     Insulin  Pen Needle 32G X 4 MM MISC 1 each by Does not apply route in the morning, at noon, in the evening, and at bedtime. 120 each 11   ipratropium-albuterol  (DUONEB) 0.5-2.5 (3) MG/3ML SOLN Take 3 mLs by nebulization every 6 (six) hours as needed. 360 mL 11   isosorbide  mononitrate (IMDUR ) 30 MG 24 hr tablet Take 0.5 tablets (15 mg total) by mouth every morning.     meclizine  (ANTIVERT ) 25 MG tablet Take 0.5 tablets (12.5 mg total) by mouth 3 (three) times daily as needed for dizziness.     nitroGLYCERIN  (NITROSTAT ) 0.4 MG SL tablet Place 1 tablet (0.4 mg total) under the tongue every 5 (five) minutes x 3 doses as needed for chest pain. 30 tablet 0   omeprazole  (PRILOSEC) 40 MG capsule Take 1 capsule (40 mg total) by mouth every morning.     potassium chloride  SA (KLOR-CON  M) 20 MEQ tablet Take 1 tablet (20 mEq total) by mouth every other day. AM     Resmetirom  (REZDIFFRA ) 100 MG TABS TAKE 1 TABLET BY MOUTH DAILY WITH OR WITHOUT FOOD 30 tablet 1   rivaroxaban  (XARELTO ) 20 MG TABS  tablet Take 1 tablet (20 mg total) by mouth daily with supper. 90 tablet 3   rosuvastatin  (CRESTOR ) 20 MG tablet Take 1 tablet (20 mg total) by mouth at bedtime.     sacubitril -valsartan  (ENTRESTO ) 24-26 MG Take 1 tablet by mouth 2 (two) times daily. 60 tablet 2   Semaglutide , 2 MG/DOSE, (OZEMPIC , 2 MG/DOSE,) 8 MG/3ML SOPN Inject 2 mg into the skin once a week. Tuesdays 3 mL 2   spironolactone  (ALDACTONE ) 25 MG tablet Take 1 tablet by mouth once daily 90 tablet 1   TALTZ 80 MG/ML SOAJ Inject 3 mLs into the skin every 28 (twenty-eight) days.     torsemide  (DEMADEX ) 20 MG tablet Take 1 tablet (20 mg total) by mouth every morning.      umeclidinium-vilanterol (ANORO ELLIPTA ) 62.5-25 MCG/ACT AEPB Inhale 1 puff into the lungs every morning.     No current facility-administered medications for this visit.   Vitals:   12/05/23 0833  BP: 114/63  Pulse: 78  SpO2: 97%  Weight: 233 lb 3.2 oz (105.8 kg)   Wt Readings from Last 3 Encounters:  12/05/23 233 lb 3.2 oz (105.8 kg)  11/20/23 230 lb (104.3 kg)  11/13/23 230 lb 8 oz (104.6 kg)   Lab Results  Component Value Date   CREATININE 1.58 (H) 11/13/2023   CREATININE 1.56 (H) 11/07/2023   CREATININE 1.20 11/06/2023    PHYSICAL EXAM:  General: Well appearing. No resp difficulty HEENT: normal Neck: supple, no JVD Cor: Regular rhythm, rate. No rubs, gallops or murmurs Lungs: clear Abdomen: soft, nontender, nondistended. Extremities: no cyanosis, clubbing, rash, edema Neuro: alert & oriented X 3. Moves all 4 extremities w/o difficulty. Affect pleasant   ECG: not done   ASSESSMENT & PLAN:  1.  Chronic HFrEF/Mixed Ischemic and nonischemic cardiomyopathy:   - Mixed ICM/NICM - NYHA class III - fluid up with worsening symptoms and weight gain - s/p [employer]q-ICD  - weighing daily; reminded to call for an overnight weight gain of > 2 pounds or a weekly weight gain of > 5 pounds - weight up 3 pounds from last visit here 1 month ago - Echo 05/21/21: EF of 40-45% along with mild LVH and mild Gabriel.  - Echo 11/24/21: EF of 20-25% along with mild LVH and mild Gabriel.  - Echo 02/06/22: EF of 30-35% along with mild/moderate AR.  - Echo 08/14/22: EF 40-45% with mild LVH, Grade I DD, severe hypokinesis of left ventricular, entire inferior and inferolateral wall.  - Echo 03/08/23: EF 45-50% with moderate LVH, trivial Gabriel, mild dilatation of aortic root at 43 mm - Echo 10/1823: EF 35-40%, global LV hypokinesis, severely dilated LV internal cavity size, mild LVH, small pericardial effusion, mild Gabriel. - continue carvedilol  3.125mg  BID - continue Farxiga  10mg  daily - continue Entresto   24/26mg  bid - continue spiro 25mg  daily - increase torsemide  to 40mg  daily X 3 days and then decrease back to 20mg  daily - continue potassium 20meq every other day - BMET/ proBNP today - continue to use Furoscix  if weight >= 250 - consider Cardiomems to help volume management as needed -> his insurance won't cover - saw EP (Riddle) 02/25 - ICD placed on 05/20/22 - saw ADHF provider (Bensimhon) 09/24 - BNP 11/03/23 reviewed and was 164.1   2.  CAD/Elevated Troponin and chronic CP:  - H/o CAD  -  RHC/ LHC 08/19/23:     1. Relatively stable appearance of the coronary arteries since last catheterization in 2021.  Chronically occluded proximal RCA now has antegrade flow, though I suspect this represents bridging collaterals.  Left-to-right collateral remain as well as moderate LAD and LCx disease.    2. Mildly elevated left heart filling pressures (LVEDP 20 mmHg, PCWP 18 mmHg).    3. Moderate pulmonary hypertension (mean PA 41 mmHg, PVR 5.3 WU).    4. Moderately reduced Fick cardiac output/index (CO 4.3 L/min, CI 1.9 L/min/m^2). - continue rosuvastatin  20mg  daily - continue isosorbide  MN 15mg  daily - saw cardiology Dene) 01/25 - lipid panel today   3.  HTN:  - BP 114/63 - saw PCP (Ostwalt) 04/25 - BMP 11/13/23 reviewed: sodium 134, potassium 4.3, creatinine 1.58 & GFR 52 - BMET today   4.  H/o PE/Chronic anticoagulation:  - continue xarelto  20mg  daily - no recent bleeding   5.  DM2- - saw nephrology (Korrapati) 08/23  - A1c 07/11/23 reviewed and was 6.6%   6. OSA- - Sleep study 3/22: AHI 28  - sleep study 12/23; AHI 45.1 - sleep study 11/24 AHI 70.0 - wearing CPAP nightly unless feeling very congested - saw pulmonology Jodeen) 11/24  7: AF- - cardioverted 11/06/23 - continue amiodarone  400mg  BID taper - continue ASA 81mg  daily - continue xarelto  20mg  daily   Return next week, sooner if needed  Ellouise DELENA Class, FNP 12/04/23

## 2023-12-05 ENCOUNTER — Encounter: Payer: Self-pay | Admitting: Family

## 2023-12-05 ENCOUNTER — Other Ambulatory Visit: Payer: Self-pay

## 2023-12-05 ENCOUNTER — Ambulatory Visit: Admitting: Family

## 2023-12-05 ENCOUNTER — Other Ambulatory Visit
Admission: RE | Admit: 2023-12-05 | Discharge: 2023-12-05 | Disposition: A | Discharge status: Home / Self Care | Source: Ambulatory Visit | Attending: Family | Admitting: Family

## 2023-12-05 VITALS — BP 114/63 | HR 78 | Wt 233.2 lb

## 2023-12-05 DIAGNOSIS — I480 Paroxysmal atrial fibrillation: Secondary | ICD-10-CM | POA: Diagnosis not present

## 2023-12-05 DIAGNOSIS — I2510 Atherosclerotic heart disease of native coronary artery without angina pectoris: Secondary | ICD-10-CM

## 2023-12-05 DIAGNOSIS — I5022 Chronic systolic (congestive) heart failure: Secondary | ICD-10-CM | POA: Diagnosis not present

## 2023-12-05 DIAGNOSIS — Z794 Long term (current) use of insulin: Secondary | ICD-10-CM | POA: Diagnosis not present

## 2023-12-05 DIAGNOSIS — N1832 Chronic kidney disease, stage 3b: Secondary | ICD-10-CM

## 2023-12-05 DIAGNOSIS — I10 Essential (primary) hypertension: Secondary | ICD-10-CM

## 2023-12-05 DIAGNOSIS — I2699 Other pulmonary embolism without acute cor pulmonale: Secondary | ICD-10-CM | POA: Diagnosis not present

## 2023-12-05 DIAGNOSIS — E1122 Type 2 diabetes mellitus with diabetic chronic kidney disease: Secondary | ICD-10-CM | POA: Diagnosis not present

## 2023-12-05 DIAGNOSIS — G4733 Obstructive sleep apnea (adult) (pediatric): Secondary | ICD-10-CM | POA: Diagnosis not present

## 2023-12-05 LAB — LIPID PANEL
Cholesterol: 124 mg/dL (ref 0–200)
HDL: 27 mg/dL — ABNORMAL LOW (ref >40)
LDL Cholesterol: 54 mg/dL (ref 0–99)
Total CHOL/HDL Ratio: 4.6 ratio
Triglycerides: 216 mg/dL — ABNORMAL HIGH (ref <150)
VLDL: 43 mg/dL — ABNORMAL HIGH (ref 0–40)

## 2023-12-05 LAB — BASIC METABOLIC PANEL WITH GFR
Anion gap: 11 (ref 5–15)
BUN: 23 mg/dL — ABNORMAL HIGH (ref 6–20)
CO2: 22 mmol/L (ref 22–32)
Calcium: 9.1 mg/dL (ref 8.9–10.3)
Chloride: 103 mmol/L (ref 98–111)
Creatinine, Ser: 1.12 mg/dL (ref 0.61–1.24)
GFR, Estimated: >60 mL/min (ref >60)
Glucose, Bld: 170 mg/dL — ABNORMAL HIGH (ref 70–99)
Potassium: 4.3 mmol/L (ref 3.5–5.1)
Sodium: 136 mmol/L (ref 135–145)

## 2023-12-05 MED ORDER — TORSEMIDE 20 MG PO TABS
40.0000 mg | ORAL_TABLET | Freq: Every day | ORAL | 0 refills | Status: DC
  Filled 2023-12-05: qty 6, 3d supply, fill #0

## 2023-12-05 NOTE — Patient Instructions (Signed)
 Medication Changes:  INCREASE YOUR TORSEMIDE  TO 40 MG ONCE DAILY FOR 3 DAYS  THEN, DECREASE BACK DOWN TO 20 MG ONCE DAILY   Lab Work:  Go over to the [facility]. Go pass the gift shop and have your blood work completed.  We will only call you if the results are abnormal or if the provider would like to make medication changes.   Follow-Up in: 1 WEEK WITH Shawnee Dellen, FNP.  At the [facility], you and your health needs are our priority. We have a designated team specialized in the treatment of Heart Failure. This Care Team includes your primary Heart Failure Specialized Cardiologist (physician), Advanced Practice Providers (APPs- Physician Assistants and Nurse Practitioners), and Pharmacist who all work together to provide you with the care you need, when you need it.   You may see any of the following providers on your designated Care Team at your next follow up:  Dr. Jules Oar Dr. Peder Bourdon Dr. Alwin Baars Dr. Judyth Nunnery Shawnee Dellen, FNP Bevely Brush, RPH-CPP  Please be sure to bring in all your medications bottles to every appointment.   Need to Contact Us :  If you have any questions or concerns before your next appointment please send us  a message through [facility] or call our office at [phone].    TO LEAVE A MESSAGE FOR THE NURSE SELECT OPTION 2, PLEASE LEAVE A MESSAGE INCLUDING: YOUR NAME DATE OF BIRTH CALL BACK NUMBER REASON FOR CALL**this is important as we prioritize the call backs  YOU WILL RECEIVE A CALL BACK THE SAME DAY AS LONG AS YOU CALL BEFORE 4:00 PM

## 2023-12-06 LAB — MISC LABCORP TEST (SEND OUT): Labcorp test code: 143000

## 2023-12-09 ENCOUNTER — Telehealth: Payer: Self-pay | Admitting: Family

## 2023-12-09 DIAGNOSIS — G8929 Other chronic pain: Secondary | ICD-10-CM | POA: Diagnosis not present

## 2023-12-09 DIAGNOSIS — M25512 Pain in left shoulder: Secondary | ICD-10-CM | POA: Diagnosis not present

## 2023-12-09 DIAGNOSIS — M25612 Stiffness of left shoulder, not elsewhere classified: Secondary | ICD-10-CM | POA: Diagnosis not present

## 2023-12-09 DIAGNOSIS — Z9889 Other specified postprocedural states: Secondary | ICD-10-CM | POA: Diagnosis not present

## 2023-12-09 DIAGNOSIS — M6281 Muscle weakness (generalized): Secondary | ICD-10-CM | POA: Diagnosis not present

## 2023-12-09 NOTE — Progress Notes (Unsigned)
 [facility] Note    PCP: [name], Janna, PA (last seen 04/25) Primary Cardiologist: [name], Timothy, MD (last seen 07/24; returns 05/25) HF cardiologist: Gabriel Oar, MD (last seen 09/24)  Chief Complaint: shortness of breath  HPI:  Gabriel [name] is a 53 y/o male with a history of atrial fibrillation (03/25), HTN, T2DM, chronic chest pain, chronic troponin elevation, COPD, CKD, asthma, NSVT, OSA, unprovoked PE, pulmonary HTN, psoriasis, migraines, cirrhosis from NASH, ICD placement (10/23), previous tobacco use and chronic heart failure. Had left shoulder arthroscopy with debridement, decompression, rotator cuff repair and biceps tenodesis done 09/16/23.   RHC/LHC done 06/13/20 and showed: Severe single-vessel coronary artery disease with chronic total occlusion of the proximal RCA.  The distal vessel fills via left to right collaterals. Mild to moderate, nonobstructive CAD involving the LAD and LCx with up to 50% stenosis. Mildly elevated left and right heart filling pressures. Moderate pulmonary hypertension (mean PAP 40 mmHg) with significantly elevated pulmonary vascular resistance (PVR 7.4 WU). Severely reduced Fick cardiac output/index.  Echo 05/21/21: EF of 40-45% along with mild LVH and mild Gabriel.   Admitted 09/2021 & 11/2021 with HF.   Echo 11/24/21: EF of 20-25% along with mild LVH and mild Gabriel.  Echo 02/06/22: EF of 30-35% along with mild/moderate AR.   Admitted 05/20/22 for ICD placement and discharged the next day. Was in the ED 12/21/22 due to dizziness & chest pain.   Echo 08/14/22: EF 40-45% with mild LVH, Grade I DD, severe hypokinesis of left ventricular, entire inferior and inferolateral wall.  Echo 03/08/23: EF 45-50% with moderate LVH, trivial Gabriel, mild dilatation of aortic root at 43 mm  Was in the ED 06/09/23 due to SOB/ chest pain for previous 2 days. CXR concerning for pneumonia so antibiotics provided. Initial troponin in the 50's which is close to  his baseline. EKG without ischemic changes.   RHC/ LHC 08/19/23:  Relatively stable appearance of the coronary arteries since last catheterization in 2021.  Chronically occluded proximal RCA now has antegrade flow, though I suspect this represents bridging collaterals.  Left-to-right collateral remain as well as moderate LAD and LCx disease. Mildly elevated left heart filling pressures (LVEDP 20 mmHg, PCWP 18 mmHg). Moderate pulmonary hypertension (mean PA 41 mmHg, PVR 5.3 WU). Moderately reduced Fick cardiac output/index (CO 4.3 L/min, CI 1.9 L/min/m^2).  Admitted 10/22/23 due to COPD exacerbation secondary to influenza.   Admitted 11/03/23 with dizziness, tachycardia and new onset AF. Echo 3/18 -- EF 35-40%, global LV hypokinesis, severely dilated LV internal cavity size, mild LVH, small pericardial effusion, mild Gabriel. IV diuresed, GDMT restarted. Successful cardioversion 11/06/23. Amiodarone  loaded.   Was seen in the HF clinic 12/05/23 where torsemide  was increased to 40mg  daily X 3 days and then decreased back to 20mg  daily.    He presents today for an acute HF visit with a chief complaint of shortness of breath with little exertion. Has associated fatigue, chest pain, palpitations, tachycardia, weight gain and dizziness. Symptoms started getting worse 3 days ago and he feels "like I'm heading back to where I ended up in the hospital". No improvement in symptoms with increased torsemide . Says that the palpitations are even waking him up from sleep. He checks his BP/ HR when he's feeling bad and his HR will be up to 100 but then quickly comes down after a few seconds. Has used furoscix  in the past but it causes him to be dehydrated.   Had zio monitor done early March which,  per EP, showed "several episodes concerning for slow VT, though do not believe the burden is quite as high as zio reports. Anticipate starting mexiletine when I see him in clinic."   ROS: All systems negative except as listed in  HPI, PMH and Problem List.  SH:  Social History   Socioeconomic History   Marital status: Widowed    Spouse name: Not on file   Number of children: 1   Years of education: 63   Highest education level: 12th grade  Occupational History   Occupation: disabled  Tobacco Use   Smoking status: Former    Current packs/day: 0.00    Average packs/day: 0.5 packs/day for 33.0 years (16.5 ttl pk-yrs)    Types: Cigarettes    Start date: 05/09/1987    Quit date: 05/08/2020    Years since quitting: 3.5   Smokeless tobacco: Former    Quit date: 05/08/2020   Tobacco comments:    Quit Sept 2021  Vaping Use   Vaping status: Former  Substance and Sexual Activity   Alcohol use: Not Currently   Drug use: No   Sexual activity: Not Currently  Other Topics Concern   Not on file  Social History Narrative   Lives at home with wife and his dad's wife.        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    Social Drivers of Health   Financial Resource Strain: Medium Risk (08/05/2022)   Overall Financial Resource Strain (CARDIA)    Difficulty of Paying Living Expenses: Somewhat hard  Food Insecurity: No Food Insecurity (11/10/2023)   Hunger Vital Sign    Worried About Running Out of Food in the Last Year: Never true    Ran Out of Food in the Last Year: Never true  Transportation Needs: No Transportation Needs (11/10/2023)   PRAPARE - Administrator, Civil Service (Medical): No    Lack of Transportation (Non-Medical): No  Physical Activity: Insufficiently Active (01/20/2018)   Exercise Vital Sign    Days of Exercise per Week: 7 days    Minutes of Exercise per Session: 10 min  Stress: Stress Concern Present (09/02/2017)   [name] of Occupational Health - Occupational Stress Questionnaire    Feeling of Stress : Very much  Social Connections: Unknown (10/22/2023)   Social Connection and Isolation Panel [NHANES]     Frequency of Communication with Friends and Family: Not on file    Frequency of Social Gatherings with Friends and Family: Patient declined    Attends Religious Services: Patient declined    Active Member of Clubs or Organizations: Patient declined    Attends Banker Meetings: Patient declined    Marital Status: Patient declined  Intimate Partner Violence: Not At Risk (11/10/2023)   Humiliation, Afraid, Rape, and Kick questionnaire    Fear of Current or Ex-Partner: No    Emotionally Abused: No    Physically Abused: No    Sexually Abused: No    FH:  Family History  Problem Relation Age of Onset   Diabetes Mother    Diabetes Mellitus II Mother    Hypothyroidism Mother    Hypertension Mother    Kidney failure Mother        Dialysis   Heart attack Mother        34 yo approximately   Hypertension Father    Gout Father    Cancer Maternal Grandfather  Diabetes Maternal Grandfather    Cancer Paternal Aunt     Past Medical History:  Diagnosis Date   Aortic root dilation (HCC) 11/28/2016   a.) TTE 11/28/2016: 43 mm; b.) TTE 11/23/2019: 38 mm; c.) TTE 08/14/2022: 40 mm; d.) TTE 03/08/2023: 43 mm   Arteriosclerosis of right carotid artery    a.) carotid doppler 04/29/2017: <50% RICA   Bronchitis 07/2023   CAD (coronary artery disease)    a.) MV 04/2015: low risk; b.) LHC 01/06/2017: minor irregs in LAD/Diag/LCX/OM, RCA 40p/m/d; c.) R/LHC 06/13/2020: 50% dLAD, 30% p-mLCx, 40% OM1, 100% pRCA (L-R collats) - med mgmt; d.) R/LHC 08/19/2023: 50% dLAD, 30% p-mLCx, 40% OM1, 100% pRCA (L-R collats), 50% p-mRCA - med mgmt   Chest wall pain, chronic    Chronic 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    Chronic Troponin Elevation    CKD (chronic kidney disease), stage II-III    COPD (chronic obstructive pulmonary disease) (HCC)    Depression    DM (diabetes mellitus), type 2 (HCC)    HFrEF (heart failure with reduced ejection fraction) (HCC)    a.) EF 03/20/2015: EF 45-50%; b.) MV  05/19/2015: EF 30-44%; c.) TTE 01/04/2016: EF 20-25%; d.) TTE 02/29/2016: EF 30-35%; e.) TTE 11/28/2016: EF 40-45%; f.) LHC 01/06/2017: EF 25-35%; g.) TTE 04/29/2017: EF 45-50%; h.) TTE 06/26/2019: EF 30-35%; i.) TTE 11/23/2019: EF 25-30%; j.) TTE 06/08/2020: EF 35-40%; k.) TTE 05/21/2021: EF 40-45%; l.) TTE 08/14/2022: EF 40-45%; m.) TTE 03/08/2023: EF 40-45%   History of 2019 novel coronavirus disease (COVID-19) 03/01/2022   History of methicillin resistant staphylococcus aureus (MRSA)    Hyperlipidemia    Hypertension    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 12/2015 noted on tele-->amio;  b. 12/2015 Event monitor: no VT noted.   Obesity (BMI 30.0-34.9)    On rivaroxaban  therapy    OSA treated with BiPAP    Polyp of descending colon    Psoriasis    Psoriatic arthritis (HCC)    a.) Tx'd with ixekizumab   Pulmonary HTN (HCC) 02/29/2016   a.) TTE 02/29/2016: RVSP 47; b.) TTE 06/26/2019: RVSP 58.4; c.) TTE 12/08/2019: RVSP 40.8; d.) TTE 06/08/2020: RVSP 44.7; e.) R/LHC 06/08/2020: mRA 9, mPA 40, mPWCP 17, AO sat 98, PA sat 61, CO 3.1, CI 1.3, PVR 7.4, LVEDP 20; f.) R/LHC 08/18/2020: mRA 5, mPA 41, mPCWP 18, AO sat 92, PA sat 59, CO 4.3, CI 1.9, PVR 5.3   Recurrent pulmonary emboli (HCC)  06/07/2020   06/07/20: small bilateral PEs.  12/31/19: RUL and RLL PEs.   Stroke [facility])    pt states per mri in 2023-unsure of when stroke happened   Syncope    a. 01/2016 - felt to be vasovagal.   Thrombocytopenia (HCC)     Current Outpatient  Medications  Medication Sig Dispense Refill   Accu-Chek Softclix Lancets lancets USE UP TO FOUR TIMES DAILY     albuterol  (VENTOLIN  HFA) 108 (90 Base) MCG/ACT inhaler Inhale 2 puffs into the lungs every 4 (four) hours as needed for wheezing or shortness of breath. 8 g 6   allopurinol  (ZYLOPRIM ) 300 MG tablet Take 300 mg by mouth every morning.     amiodarone  (PACERONE ) 200 MG tablet Take 2 tablets (400 mg total) by mouth 2 (two) times daily for 14 days, THEN 1 tablet (200 mg total) 2 (two) times daily for 14 days, THEN 1 tablet (200 mg total) 2 (two) times daily. 144 tablet 0   aspirin  EC 81 MG tablet Take 81 mg by mouth every morning.     azelastine  (ASTELIN ) 0.1 % nasal spray Place 2 sprays into both nostrils 2 (two) times daily. Use in each nostril as directed 30 mL 12   BD PEN NEEDLE NANO 2ND GEN 32G X 4 MM MISC USE  AS DIRECTED AT BEDTIME 200 each 0   blood glucose meter kit and supplies KIT Dispense based on patient and insurance preference. Use up to four times daily as directed. 1 each 11   buPROPion  (WELLBUTRIN  XL) 150 MG 24 hr tablet Take 150 mg by mouth daily.     butalbital -acetaminophen -caffeine  (FIORICET ) 50-325-40 MG tablet Take 1 tablet by mouth every 4 (four) hours as needed for migraine.     carvedilol  (COREG ) 3.125 MG tablet Take 1 tablet (3.125 mg total) by mouth 2 (two) times daily with a meal. 60 tablet 2   cetirizine  (ZYRTEC ) 10 MG tablet Take 1 tablet (10 mg total) by mouth every morning.     colchicine  0.6 MG tablet Take 1 tablet (0.6 mg total) by mouth daily as needed (gout flare).     Continuous Glucose Sensor (FREESTYLE LIBRE 3 PLUS SENSOR) MISC Place 1 sensor on the skin every 15 days. Use to check glucose continuously 2 each  12   Continuous Glucose Sensor (FREESTYLE LIBRE 3 SENSOR) MISC Place 1 sensor on the skin every 14 days. Use to check glucose continuously 2 each 11   dapagliflozin  propanediol (FARXIGA ) 10 MG TABS tablet Take 1 tablet (10 mg total) by mouth daily. 90 tablet 3   fenofibrate  (TRICOR ) 145 MG tablet Take 1 tablet (145 mg total) by mouth every evening.     fluticasone  (FLONASE ) 50 MCG/ACT nasal spray Place 2 sprays into both nostrils daily as needed for allergies or rhinitis. Home med.     Galcanezumab-gnlm 120 MG/ML SOAJ Inject 120 mg into the skin. Monthly     Insulin  Glargine (BASAGLAR  KWIKPEN) 100 UNIT/ML Inject 36 Units into the skin at bedtime. 45 mL 3   insulin  lispro (HUMALOG ) 100 UNIT/ML injection Inject 10 Units into the skin 3 (three) times daily before meals.     Insulin  Pen Needle 32G X 4 MM MISC 1 each by Does not apply route in the morning, at noon, in the evening, and at bedtime. 120 each 11   ipratropium-albuterol  (DUONEB) 0.5-2.5 (3) MG/3ML SOLN Take 3 mLs by nebulization every 6 (six) hours as needed. 360 mL 11   meclizine  (ANTIVERT ) 25 MG tablet Take 0.5 tablets (12.5 mg total) by mouth 3 (three) times daily as needed for dizziness.     nitroGLYCERIN  (NITROSTAT ) 0.4 MG SL tablet Place 1 tablet (0.4 mg total) under the tongue every 5 (five) minutes x 3 doses as needed for chest pain. 30 tablet 0  omeprazole  (PRILOSEC) 40 MG capsule Take 1 capsule (40 mg total) by mouth every morning.     potassium chloride  SA (KLOR-CON  M) 20 MEQ tablet Take 1 tablet (20 mEq total) by mouth every other day. AM     Resmetirom  (REZDIFFRA ) 100 MG TABS TAKE 1 TABLET BY MOUTH DAILY WITH OR WITHOUT FOOD 30 tablet 1   rivaroxaban  (XARELTO ) 20 MG TABS tablet Take 1 tablet (20 mg total) by mouth daily with supper. 90 tablet 3   rosuvastatin  (CRESTOR ) 20 MG tablet Take 1 tablet (20 mg total) by mouth at bedtime.     sacubitril -valsartan  (ENTRESTO ) 24-26 MG Take 1 tablet by mouth 2 (two) times daily. 60 tablet 2    Semaglutide , 2 MG/DOSE, (OZEMPIC , 2 MG/DOSE,) 8 MG/3ML SOPN Inject 2 mg into the skin once a week. Tuesdays 3 mL 2   spironolactone  (ALDACTONE ) 25 MG tablet Take 1 tablet by mouth once daily 90 tablet 1   TALTZ 80 MG/ML SOAJ Inject 3 mLs into the skin every 28 (twenty-eight) days.     torsemide  (DEMADEX ) 20 MG tablet Take 1 tablet (20 mg total) by mouth every morning.     torsemide  (DEMADEX ) 20 MG tablet Take 2 tablets (40 mg total) by mouth daily. 6 tablet 0   umeclidinium-vilanterol (ANORO ELLIPTA ) 62.5-25 MCG/ACT AEPB Inhale 1 puff into the lungs every morning.     No current facility-administered medications for this visit.   Vitals:   12/10/23 0819  BP: 114/76  Pulse: 77  SpO2: 97%  Weight: 233 lb (105.7 kg)   Wt Readings from Last 3 Encounters:  12/10/23 233 lb (105.7 kg)  12/05/23 233 lb 3.2 oz (105.8 kg)  11/20/23 230 lb (104.3 kg)   Lab Results  Component Value Date   CREATININE 1.12 12/05/2023   CREATININE 1.58 (H) 11/13/2023   CREATININE 1.56 (H) 11/07/2023    PHYSICAL EXAM:  General: Ill appearing, SOB @ rest HEENT: normal Neck: supple, no JVD Cor: Regular rhythm, rate. No rubs, gallops or murmurs Lungs: clear although visibly SOB even with talking Abdomen: soft, nontender, distended. Extremities: no cyanosis, clubbing, rash, edema Neuro: alert & oriented X 3. Moves all 4 extremities w/o difficulty. Affect pleasant   ECG: NSR HR 76 (personally reviewed)   ASSESSMENT & PLAN:  1.  Chronic HFrEF/Mixed Ischemic and nonischemic cardiomyopathy:   - Mixed ICM/NICM - NYHA class III / IV - fluid up with continued worsening symptoms and home weight gain even after increased torsemide  dose - s/p [employer]; denies any shocks - weighing daily; reminded to call for an overnight weight gain of > 2 pounds or a weekly weight gain of > 5 pounds - weight stable from last visit here 1 week ago - Echo 05/21/21: EF of 40-45% along with mild LVH and mild Gabriel.  -  Echo 11/24/21: EF of 20-25% along with mild LVH and mild Gabriel.  - Echo 02/06/22: EF of 30-35% along with mild/moderate AR.  - Echo 08/14/22: EF 40-45% with mild LVH, Grade I DD, severe hypokinesis of left ventricular, entire inferior and inferolateral wall.  - Echo 03/08/23: EF 45-50% with moderate LVH, trivial Gabriel, mild dilatation of aortic root at 43 mm - Echo 10/1823: EF 35-40%, global LV hypokinesis, severely dilated LV internal cavity size, mild LVH, small pericardial effusion, mild Gabriel. - continue carvedilol  3.125mg  BID - continue Farxiga  10mg  daily - continue Entresto  24/26mg  bid - continue spiro 25mg  daily - continue torsemide  20mg  daily - continue potassium 20meq  every other day - will send for 80mg  IV lasix / 40meq PO potassium today - BMET/ BNP today - continue to use Furoscix  if weight >= 250 although he says that this dehydrates him - consider Cardiomems to help volume management as needed => his insurance won't cover - saw ADHF provider (Bensimhon) 09/24 - BNP 11/13/23 reviewed and was 164.1262.3   2.  CAD/Elevated Troponin and chronic CP:  - H/o CAD  - RHC/ LHC 08/19/23:     1. Relatively stable appearance of the coronary arteries since last catheterization in 2021.  Chronically occluded proximal RCA now has antegrade flow, though I suspect this represents bridging collaterals.  Left-to-right collateral remain as well as moderate LAD and LCx disease.    2. Mildly elevated left heart filling pressures (LVEDP 20 mmHg, PCWP 18 mmHg).    3. Moderate pulmonary hypertension (mean PA 41 mmHg, PVR 5.3 WU).    4. Moderately reduced Fick cardiac output/index (CO 4.3 L/min, CI 1.9 L/min/m^2). - continue rosuvastatin  20mg  daily - continue isosorbide  MN 15mg  daily - saw cardiology Gennaro Khat) 01/25 - lipid panel 12/05/23: LDL 54, triglycerides 216   3.  HTN:  - BP 114/76 - saw PCP ([name]) 04/25 - BMP 12/05/23 reviewed: sodium 136, potassium 4.3, creatinine 1.12 & GFR >60 - BMET today with IV  lasix    4.  H/o PE/Chronic anticoagulation:  - continue xarelto  20mg  daily - no recent bleeding   5.  DM2- - saw nephrology (Korrapati) 08/23  - A1c 07/11/23 reviewed and was 6.6%   6. OSA- - Sleep study 3/22: AHI 28  - sleep study 12/23; AHI 45.1 - sleep study 11/24 AHI 70.0 - wearing CPAP nightly unless feeling very congested - saw pulmonology Gaynell Keeler) 11/24  7: AF- - saw EP (Riddle) 02/25 - cardioverted 11/06/23 - continue amiodarone  400mg  BID taper - continue ASA 81mg  daily - continue xarelto  20mg  daily - zio monitor was done early March but will place back on today since worsening symptoms; will reach out to EP as his next appt is in 2 weeks. On previous monitor EP had noted probably starting mexiletine at his next visit   Return in 2 days, sooner if needed. If symptoms worsen or he doesn't get any improvement with IV lasix , he needs to go to the ER.    Charlette Console, FNP 12/09/23

## 2023-12-09 NOTE — Telephone Encounter (Signed)
 Called to confirm/remind patient of their appointment at the [facility] on 12/10/23.   Appointment:   [x] Confirmed  [] Left mess   [] No answer/No voice mail  [] VM Full/unable to leave message  [] Phone not in service  Patient reminded to bring all medications and/or complete list.  Confirmed patient has transportation. Gave directions, instructed to utilize valet parking.

## 2023-12-09 NOTE — Telephone Encounter (Signed)
 Spoke with patient by phone. He states his heart rate is mostly staying around 80 at rest and 100 with any activity the past few days. He reports increased shortness of breath and fatigue and states " it feels like it felt when I was in afib".  Clinic appointment moved up to tomorrow morning and will obtain an EKG. Pt instructed if HR increases or symptoms become worse to seek emergency help. Pt aware, agreeable, and verbalized understanding.

## 2023-12-10 ENCOUNTER — Other Ambulatory Visit: Payer: Self-pay | Admitting: Family

## 2023-12-10 ENCOUNTER — Ambulatory Visit
Admission: RE | Admit: 2023-12-10 | Discharge: 2023-12-10 | Disposition: A | Discharge status: Home / Self Care | Source: Ambulatory Visit | Attending: Family | Admitting: Family

## 2023-12-10 ENCOUNTER — Ambulatory Visit (HOSPITAL_BASED_OUTPATIENT_CLINIC_OR_DEPARTMENT_OTHER): Admitting: Family

## 2023-12-10 ENCOUNTER — Other Ambulatory Visit: Payer: Self-pay | Admitting: Internal Medicine

## 2023-12-10 ENCOUNTER — Other Ambulatory Visit: Payer: Self-pay | Admitting: Pharmacy

## 2023-12-10 ENCOUNTER — Ambulatory Visit (HOSPITAL_COMMUNITY)
Admission: RE | Admit: 2023-12-10 | Discharge: 2023-12-10 | Disposition: A | Discharge status: Home / Self Care | Source: Ambulatory Visit | Attending: Internal Medicine | Admitting: Internal Medicine

## 2023-12-10 ENCOUNTER — Encounter: Payer: Self-pay | Admitting: Family

## 2023-12-10 ENCOUNTER — Other Ambulatory Visit: Payer: Self-pay

## 2023-12-10 ENCOUNTER — Other Ambulatory Visit: Admission: RE | Admit: 2023-12-10 | Source: Ambulatory Visit

## 2023-12-10 VITALS — BP 114/76 | HR 77 | Wt 233.0 lb

## 2023-12-10 DIAGNOSIS — K7581 Nonalcoholic steatohepatitis (NASH): Secondary | ICD-10-CM | POA: Insufficient documentation

## 2023-12-10 DIAGNOSIS — I4891 Unspecified atrial fibrillation: Secondary | ICD-10-CM | POA: Insufficient documentation

## 2023-12-10 DIAGNOSIS — I5089 Other heart failure: Secondary | ICD-10-CM | POA: Diagnosis not present

## 2023-12-10 DIAGNOSIS — G4733 Obstructive sleep apnea (adult) (pediatric): Secondary | ICD-10-CM

## 2023-12-10 DIAGNOSIS — Z86711 Personal history of pulmonary embolism: Secondary | ICD-10-CM | POA: Insufficient documentation

## 2023-12-10 DIAGNOSIS — I2510 Atherosclerotic heart disease of native coronary artery without angina pectoris: Secondary | ICD-10-CM | POA: Diagnosis not present

## 2023-12-10 DIAGNOSIS — I480 Paroxysmal atrial fibrillation: Secondary | ICD-10-CM | POA: Diagnosis not present

## 2023-12-10 DIAGNOSIS — Z87891 Personal history of nicotine dependence: Secondary | ICD-10-CM | POA: Insufficient documentation

## 2023-12-10 DIAGNOSIS — L409 Psoriasis, unspecified: Secondary | ICD-10-CM | POA: Insufficient documentation

## 2023-12-10 DIAGNOSIS — N189 Chronic kidney disease, unspecified: Secondary | ICD-10-CM | POA: Insufficient documentation

## 2023-12-10 DIAGNOSIS — I4729 Other ventricular tachycardia: Secondary | ICD-10-CM | POA: Insufficient documentation

## 2023-12-10 DIAGNOSIS — I5022 Chronic systolic (congestive) heart failure: Secondary | ICD-10-CM | POA: Diagnosis not present

## 2023-12-10 DIAGNOSIS — Z794 Long term (current) use of insulin: Secondary | ICD-10-CM

## 2023-12-10 DIAGNOSIS — E1122 Type 2 diabetes mellitus with diabetic chronic kidney disease: Secondary | ICD-10-CM | POA: Insufficient documentation

## 2023-12-10 DIAGNOSIS — I10 Essential (primary) hypertension: Secondary | ICD-10-CM | POA: Diagnosis not present

## 2023-12-10 DIAGNOSIS — J4489 Other specified chronic obstructive pulmonary disease: Secondary | ICD-10-CM | POA: Insufficient documentation

## 2023-12-10 DIAGNOSIS — Z95810 Presence of automatic (implantable) cardiac defibrillator: Secondary | ICD-10-CM | POA: Insufficient documentation

## 2023-12-10 DIAGNOSIS — N1832 Chronic kidney disease, stage 3b: Secondary | ICD-10-CM

## 2023-12-10 DIAGNOSIS — I130 Hypertensive heart and chronic kidney disease with heart failure and stage 1 through stage 4 chronic kidney disease, or unspecified chronic kidney disease: Secondary | ICD-10-CM | POA: Insufficient documentation

## 2023-12-10 DIAGNOSIS — I2699 Other pulmonary embolism without acute cor pulmonale: Secondary | ICD-10-CM | POA: Diagnosis not present

## 2023-12-10 DIAGNOSIS — Z7901 Long term (current) use of anticoagulants: Secondary | ICD-10-CM | POA: Insufficient documentation

## 2023-12-10 DIAGNOSIS — I272 Pulmonary hypertension, unspecified: Secondary | ICD-10-CM | POA: Insufficient documentation

## 2023-12-10 DIAGNOSIS — Z79899 Other long term (current) drug therapy: Secondary | ICD-10-CM | POA: Insufficient documentation

## 2023-12-10 DIAGNOSIS — Z7985 Long-term (current) use of injectable non-insulin antidiabetic drugs: Secondary | ICD-10-CM | POA: Insufficient documentation

## 2023-12-10 DIAGNOSIS — I255 Ischemic cardiomyopathy: Secondary | ICD-10-CM | POA: Insufficient documentation

## 2023-12-10 DIAGNOSIS — I428 Other cardiomyopathies: Secondary | ICD-10-CM | POA: Insufficient documentation

## 2023-12-10 DIAGNOSIS — R002 Palpitations: Secondary | ICD-10-CM

## 2023-12-10 DIAGNOSIS — R7989 Other specified abnormal findings of blood chemistry: Secondary | ICD-10-CM | POA: Insufficient documentation

## 2023-12-10 DIAGNOSIS — R0789 Other chest pain: Secondary | ICD-10-CM | POA: Insufficient documentation

## 2023-12-10 LAB — BASIC METABOLIC PANEL WITH GFR
Anion gap: 8 (ref 5–15)
BUN: 30 mg/dL — ABNORMAL HIGH (ref 6–20)
CO2: 21 mmol/L — ABNORMAL LOW (ref 22–32)
Calcium: 9.5 mg/dL (ref 8.9–10.3)
Chloride: 103 mmol/L (ref 98–111)
Creatinine, Ser: 1.42 mg/dL — ABNORMAL HIGH (ref 0.61–1.24)
GFR, Estimated: 59 mL/min — ABNORMAL LOW (ref >60)
Glucose, Bld: 180 mg/dL — ABNORMAL HIGH (ref 70–99)
Potassium: 4.2 mmol/L (ref 3.5–5.1)
Sodium: 132 mmol/L — ABNORMAL LOW (ref 135–145)

## 2023-12-10 LAB — BRAIN NATRIURETIC PEPTIDE: B Natriuretic Peptide: 148.9 pg/mL — ABNORMAL HIGH (ref 0.0–100.0)

## 2023-12-10 MED ORDER — POTASSIUM CHLORIDE CRYS ER 20 MEQ PO TBCR
EXTENDED_RELEASE_TABLET | ORAL | Status: AC
  Filled 2023-12-10: qty 2

## 2023-12-10 MED ORDER — FUROSEMIDE 10 MG/ML IJ SOLN
80.0000 mg | Freq: Once | INTRAMUSCULAR | Status: AC
  Administered 2023-12-10: 80 mg via INTRAVENOUS

## 2023-12-10 MED ORDER — POTASSIUM CHLORIDE CRYS ER 20 MEQ PO TBCR
40.0000 meq | EXTENDED_RELEASE_TABLET | Freq: Once | ORAL | Status: AC
  Administered 2023-12-10: 40 meq via ORAL

## 2023-12-10 MED ORDER — FUROSEMIDE 10 MG/ML IJ SOLN
INTRAMUSCULAR | Status: AC
  Filled 2023-12-10: qty 8

## 2023-12-10 NOTE — Progress Notes (Signed)
 Called same day surgery to verify that it is ok that he go for IV lasix . Pt to be sent over.

## 2023-12-10 NOTE — Progress Notes (Signed)
 Zio patch placed onto patient.  All instructions and information reviewed with patient, they verbalize understanding with no questions.

## 2023-12-10 NOTE — Patient Instructions (Addendum)
 [facility]  today, and return to clinic for follow up appointment on Friday, April 25th.    Your provider has recommended that  you wear a Zio Patch for 7 days.  This monitor will record your heart rhythm for our review.  IF you have any symptoms while wearing the monitor please press the button.  If you have any issues with the patch or you notice a red or orange light on it please call the company at [phone].  Once you remove the patch please mail it back to the company as soon as possible so we can get the results.

## 2023-12-11 ENCOUNTER — Telehealth: Payer: Self-pay | Admitting: Family

## 2023-12-11 NOTE — Progress Notes (Unsigned)
 [facility] Note    PCP: [name], Janna, PA (last seen 04/25) Primary Cardiologist: [name], Timothy, MD (last seen 07/24; returns 05/25) HF cardiologist: Gabriel Oar, MD (last seen 09/24)  Chief Complaint: shortness of breath  HPI:  Gabriel [name] is a 53 y/o male with a history of atrial fibrillation (03/25), HTN, T2DM, chronic chest pain, chronic troponin elevation, COPD, CKD, asthma, NSVT, OSA, unprovoked PE, pulmonary HTN, psoriasis, migraines, cirrhosis from NASH, ICD placement (10/23), previous tobacco use and chronic heart failure. Had left shoulder arthroscopy with debridement, decompression, rotator cuff repair and biceps tenodesis done 09/16/23.   RHC/LHC done 06/13/20 and showed: Severe single-vessel coronary artery disease with chronic total occlusion of the proximal RCA.  The distal vessel fills via left to right collaterals. Mild to moderate, nonobstructive CAD involving the LAD and LCx with up to 50% stenosis. Mildly elevated left and right heart filling pressures. Moderate pulmonary hypertension (mean PAP 40 mmHg) with significantly elevated pulmonary vascular resistance (PVR 7.4 WU). Severely reduced Fick cardiac output/index.  Echo 05/21/21: EF of 40-45% along with mild LVH and mild Gabriel.   Admitted 09/2021 & 11/2021 with HF.   Echo 11/24/21: EF of 20-25% along with mild LVH and mild Gabriel.  Echo 02/06/22: EF of 30-35% along with mild/moderate AR.   Admitted 05/20/22 for ICD placement and discharged the next day. Was in the ED 12/21/22 due to dizziness & chest pain.   Echo 08/14/22: EF 40-45% with mild LVH, Grade I DD, severe hypokinesis of left ventricular, entire inferior and inferolateral wall.  Echo 03/08/23: EF 45-50% with moderate LVH, trivial Gabriel, mild dilatation of aortic root at 43 mm  Was in the ED 06/09/23 due to SOB/ chest pain for previous 2 days. CXR concerning for pneumonia so antibiotics provided. Initial troponin in the 50's which is close to  his baseline. EKG without ischemic changes.   RHC/ LHC 08/19/23:  Relatively stable appearance of the coronary arteries since last catheterization in 2021.  Chronically occluded proximal RCA now has antegrade flow, though I suspect this represents bridging collaterals.  Left-to-right collateral remain as well as moderate LAD and LCx disease. Mildly elevated left heart filling pressures (LVEDP 20 mmHg, PCWP 18 mmHg). Moderate pulmonary hypertension (mean PA 41 mmHg, PVR 5.3 WU). Moderately reduced Fick cardiac output/index (CO 4.3 L/min, CI 1.9 L/min/m^2).  Admitted 10/22/23 due to COPD exacerbation secondary to influenza.   Admitted 11/03/23 with dizziness, tachycardia and new onset AF. Echo 3/18 -- EF 35-40%, global LV hypokinesis, severely dilated LV internal cavity size, mild LVH, small pericardial effusion, mild Gabriel. IV diuresed, GDMT restarted. Successful cardioversion 11/06/23. Amiodarone  loaded.   Was seen in the HF clinic 12/05/23 where torsemide  was increased to 40mg  daily X 3 days and then decreased back to 20mg  daily.   Was seen in the HF clinic 12/10/23 & was sent for IV lasix  due to being fluid up. Zio monitor placed due to increased palpitations.    He presents today for a HF follow-up visit with a chief complaint of shortness of breath with little exertion. Has fatigue, "squeezing" chest pressure, occasional palpitations, dizziness. Denies chest pain, abdominal distention or pedal edema. Doesn't feel any better since receiving IV lasix  2 days ago and says that he feels like he's headed back into atrial fibrillation. Continues to occasionally feel like his heart is racing. Has not taken any NTG because he says that he's not feeling any chest pain but describes it as a squeezing sensation.  ROS: All systems negative except as listed in HPI, PMH and Problem List.  SH:  Social History   Socioeconomic History   Marital status: Widowed    Spouse name: Not on file   Number of children: 1    Years of education: 63   Highest education level: 12th grade  Occupational History   Occupation: disabled  Tobacco Use   Smoking status: Former    Current packs/day: 0.00    Average packs/day: 0.5 packs/day for 33.0 years (16.5 ttl pk-yrs)    Types: Cigarettes    Start date: 05/09/1987    Quit date: 05/08/2020    Years since quitting: 3.5   Smokeless tobacco: Former    Quit date: 05/08/2020   Tobacco comments:    Quit Sept 2021  Vaping Use   Vaping status: Former  Substance and Sexual Activity   Alcohol use: Not Currently   Drug use: No   Sexual activity: Not Currently  Other Topics Concern   Not on file  Social History Narrative   Lives at home with wife and his dad's wife.        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    Social Drivers of Health   Financial Resource Strain: Medium Risk (08/05/2022)   Overall Financial Resource Strain (CARDIA)    Difficulty of Paying Living Expenses: Somewhat hard  Food Insecurity: No Food Insecurity (11/10/2023)   Hunger Vital Sign    Worried About Running Out of Food in the Last Year: Never true    Ran Out of Food in the Last Year: Never true  Transportation Needs: No Transportation Needs (11/10/2023)   PRAPARE - Administrator, Civil Service (Medical): No    Lack of Transportation (Non-Medical): No  Physical Activity: Insufficiently Active (01/20/2018)   Exercise Vital Sign    Days of Exercise per Week: 7 days    Minutes of Exercise per Session: 10 min  Stress: Stress Concern Present (09/02/2017)   [name] [employer] - Occupational Stress Questionnaire    Feeling of Stress : Very much  Social Connections: Unknown (10/22/2023)   Social Connection and Isolation Panel [NHANES]    Frequency of Communication with Friends and Family: Not on file    Frequency of Social Gatherings with Friends and Family: Patient declined    Attends Religious  Services: Patient declined    Active Member of Clubs or Organizations: Patient declined    Attends Banker Meetings: Patient declined    Marital Status: Patient declined  Intimate Partner Violence: Not At Risk (11/10/2023)   Humiliation, Afraid, Rape, and Kick questionnaire    Fear of Current or Ex-Partner: No    Emotionally Abused: No    Physically Abused: No    Sexually Abused: No    FH:  Family History  Problem Relation Age of Onset   Diabetes Mother    Diabetes Mellitus II Mother    Hypothyroidism Mother    Hypertension Mother    Kidney failure Mother        Dialysis   Heart attack Mother        70 yo approximately   Hypertension Father    Gout Father    Cancer Maternal Grandfather    Diabetes Maternal Grandfather    Cancer Paternal Aunt     Past Medical History:  Diagnosis Date   Aortic root dilation (HCC) 11/28/2016   a.) TTE 11/28/2016:  43 mm; b.) TTE 11/23/2019: 38 mm; c.) TTE 08/14/2022: 40 mm; d.) TTE 03/08/2023: 43 mm   Arteriosclerosis of right carotid artery    a.) carotid doppler 04/29/2017: <50% RICA   Bronchitis 07/2023   CAD (coronary artery disease)    a.) MV 04/2015: low risk; b.) LHC 01/06/2017: minor irregs in LAD/Diag/LCX/OM, RCA 40p/m/d; c.) R/LHC 06/13/2020: 50% dLAD, 30% p-mLCx, 40% OM1, 100% pRCA (L-R collats) - med mgmt; d.) R/LHC 08/19/2023: 50% dLAD, 30% p-mLCx, 40% OM1, 100% pRCA (L-R collats), 50% p-mRCA - med mgmt   Chest wall pain, chronic    Chronic 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    Chronic Troponin Elevation    CKD (chronic kidney disease), stage II-III    COPD (chronic obstructive pulmonary disease) (HCC)    Depression    DM (diabetes mellitus), type 2 (HCC)    HFrEF (heart failure with reduced ejection fraction) (HCC)    a.) EF 03/20/2015: EF 45-50%; b.) MV 05/19/2015: EF 30-44%; c.) TTE 01/04/2016: EF 20-25%; d.) TTE 02/29/2016: EF 30-35%; e.) TTE 11/28/2016: EF 40-45%; f.) LHC 01/06/2017: EF 25-35%; g.) TTE 04/29/2017:  EF 45-50%; h.) TTE 06/26/2019: EF 30-35%; i.) TTE 11/23/2019: EF 25-30%; j.) TTE 06/08/2020: EF 35-40%; k.) TTE 05/21/2021: EF 40-45%; l.) TTE 08/14/2022: EF 40-45%; m.) TTE 03/08/2023: EF 40-45%   History of 2019 novel coronavirus disease (COVID-19) 03/01/2022   History of methicillin resistant staphylococcus aureus (MRSA)    Hyperlipidemia    Hypertension    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 12/2015 noted on tele-->amio;  b. 12/2015 Event monitor: no VT noted.   Obesity (BMI 30.0-34.9)    On rivaroxaban  therapy    OSA treated with BiPAP    Polyp of descending colon    Psoriasis    Psoriatic arthritis (HCC)    a.) Tx'd with ixekizumab   Pulmonary HTN (HCC) 02/29/2016   a.) TTE 02/29/2016: RVSP 47; b.) TTE 06/26/2019: RVSP 58.4; c.) TTE 12/08/2019: RVSP 40.8; d.) TTE 06/08/2020: RVSP 44.7; e.) R/LHC 06/08/2020: mRA 9, mPA 40, mPWCP 17, AO sat 98, PA sat 61, CO 3.1, CI 1.3, PVR 7.4, LVEDP 20; f.) R/LHC 08/18/2020: mRA 5, mPA 41, mPCWP 18, AO sat 92, PA sat 59, CO 4.3, CI 1.9, PVR 5.3   Recurrent pulmonary emboli (HCC) 06/07/2020   06/07/20: small bilateral PEs.  12/31/19: RUL and RLL PEs.   Stroke [facility])    pt states per mri in 2023-unsure of when stroke happened   Syncope    a.  01/2016 - felt to be vasovagal.   Thrombocytopenia (HCC)     Current Outpatient Medications  Medication Sig Dispense Refill   Accu-Chek Softclix Lancets lancets USE UP TO FOUR TIMES DAILY     albuterol  (VENTOLIN  HFA) 108 (90 Base) MCG/ACT inhaler Inhale  2 puffs into the lungs every 4 (four) hours as needed for wheezing or shortness of breath. 8 g 6   allopurinol  (ZYLOPRIM ) 300 MG tablet Take 300 mg by mouth every morning.     amiodarone  (PACERONE ) 200 MG tablet Take 2 tablets (400 mg total) by mouth 2 (two) times daily for 14 days, THEN 1 tablet (200 mg total) 2 (two) times daily for 14 days, THEN 1 tablet (200 mg total) 2 (two) times daily. 144 tablet 0   aspirin  EC 81 MG tablet Take 81 mg by mouth every morning.     azelastine  (ASTELIN ) 0.1 % nasal spray Place 2 sprays into both nostrils 2 (two) times daily. Use in each nostril as directed 30 mL 12   BD PEN NEEDLE NANO 2ND GEN 32G X 4 MM MISC USE  AS DIRECTED AT BEDTIME 200 each 0   blood glucose meter kit and supplies KIT Dispense based on patient and insurance preference. Use up to four times daily as directed. 1 each 11   buPROPion  (WELLBUTRIN  XL) 150 MG 24 hr tablet Take 150 mg by mouth daily.     butalbital -acetaminophen -caffeine  (FIORICET ) 50-325-40 MG tablet Take 1 tablet by mouth every 4 (four) hours as needed for migraine.     carvedilol  (COREG ) 3.125 MG tablet Take 1 tablet (3.125 mg total) by mouth 2 (two) times daily with a meal. 60 tablet 2   cetirizine  (ZYRTEC ) 10 MG tablet Take 1 tablet (10 mg total) by mouth every morning.     colchicine  0.6 MG tablet Take 1 tablet (0.6 mg total) by mouth daily as needed (gout flare).     Continuous Glucose Sensor (FREESTYLE LIBRE 3 PLUS SENSOR) MISC Place 1 sensor on the skin every 15 days. Use to check glucose continuously 2 each 12   Continuous Glucose Sensor (FREESTYLE LIBRE 3 SENSOR) MISC Place 1 sensor on the skin every 14 days. Use to check glucose continuously 2 each 11   dapagliflozin   propanediol (FARXIGA ) 10 MG TABS tablet Take 1 tablet (10 mg total) by mouth daily. 90 tablet 3   fenofibrate  (TRICOR ) 145 MG tablet Take 1 tablet (145 mg total) by mouth every evening.     fluticasone  (FLONASE ) 50 MCG/ACT nasal spray Place 2 sprays into both nostrils daily as needed for allergies or rhinitis. Home med.     Galcanezumab-gnlm 120 MG/ML SOAJ Inject 120 mg into the skin. Monthly     Insulin  Glargine (BASAGLAR  KWIKPEN) 100 UNIT/ML Inject 36 Units into the skin at bedtime. 45 mL 3   insulin  lispro (HUMALOG ) 100 UNIT/ML injection Inject 10 Units into the skin 3 (three) times daily before meals.     Insulin  Pen Needle 32G X 4 MM MISC 1 each by Does not apply route in the morning, at noon, in the evening, and at bedtime. 120 each 11   ipratropium-albuterol  (DUONEB) 0.5-2.5 (3) MG/3ML SOLN Take 3 mLs by nebulization every 6 (six) hours as needed. 360 mL 11   meclizine  (ANTIVERT ) 25 MG tablet Take 0.5 tablets (12.5 mg total) by mouth 3 (three) times daily as needed for dizziness.     nitroGLYCERIN  (NITROSTAT ) 0.4 MG SL tablet Place 1 tablet (0.4 mg total) under the tongue every 5 (five) minutes x 3 doses as needed for chest pain. 30 tablet 0   omeprazole  (PRILOSEC) 40 MG capsule Take 1 capsule (40 mg total) by mouth every morning.     potassium chloride  SA (KLOR-CON  M) 20 MEQ tablet Take 1 tablet (  20 mEq total) by mouth every other day. AM     Resmetirom  (REZDIFFRA ) 100 MG TABS TAKE 1 TABLET BY MOUTH DAILY WITH OR WITHOUT FOOD 30 tablet 1   rivaroxaban  (XARELTO ) 20 MG TABS tablet Take 1 tablet (20 mg total) by mouth daily with supper. 90 tablet 3   rosuvastatin  (CRESTOR ) 20 MG tablet Take 1 tablet (20 mg total) by mouth at bedtime.     sacubitril -valsartan  (ENTRESTO ) 24-26 MG Take 1 tablet by mouth 2 (two) times daily. 60 tablet 2   Semaglutide , 2 MG/DOSE, (OZEMPIC , 2 MG/DOSE,) 8 MG/3ML SOPN Inject 2 mg into the skin once a week. Tuesdays 3 mL 2   spironolactone  (ALDACTONE ) 25 MG tablet Take  1 tablet by mouth once daily 90 tablet 1   TALTZ 80 MG/ML SOAJ Inject 3 mLs into the skin every 28 (twenty-eight) days.     torsemide  (DEMADEX ) 20 MG tablet Take 1 tablet (20 mg total) by mouth every morning.     umeclidinium-vilanterol (ANORO ELLIPTA ) 62.5-25 MCG/ACT AEPB Inhale 1 puff into the lungs every morning.     No current facility-administered medications for this visit.   Vitals:   12/12/23 1046  BP: 113/70  Pulse: 76  SpO2: 98%  Weight: 233 lb (105.7 kg)   Wt Readings from Last 3 Encounters:  12/12/23 233 lb (105.7 kg)  12/12/23 233 lb (105.7 kg)  12/10/23 233 lb (105.7 kg)   Lab Results  Component Value Date   CREATININE 1.33 (H) 12/12/2023   CREATININE 1.42 (H) 12/10/2023   CREATININE 1.12 12/05/2023    PHYSICAL EXAM:  General: Well appearing. Visibly SOB although less than 2 days ago HEENT: normal Neck: supple, no JVD Cor: Regular rhythm, rate. No rubs, gallops or murmurs Lungs: clear Abdomen: soft, nontender, distended. Extremities: no cyanosis, clubbing, rash, edema Neuro: alert & oriented X 3. Moves all 4 extremities w/o difficulty. Affect pleasant   ECG: NSR HR 78 (personally reviewed)   ASSESSMENT & PLAN:  1.  Chronic HFrEF/Mixed Ischemic and nonischemic cardiomyopathy:   - Mixed ICM/NICM - NYHA class III / IV - euvolemic - s/p [employer]; denies any shocks - weighing daily; reminded to call for an overnight weight gain of > 2 pounds or a weekly weight gain of > 5 pounds - weight unchanged from last visit here 2 days ago - Echo 05/21/21: EF of 40-45% along with mild LVH and mild Gabriel.  - Echo 11/24/21: EF of 20-25% along with mild LVH and mild Gabriel.  - Echo 02/06/22: EF of 30-35% along with mild/moderate AR.  - Echo 08/14/22: EF 40-45% with mild LVH, Grade I DD, severe hypokinesis of left ventricular, entire inferior and inferolateral wall.  - Echo 03/08/23: EF 45-50% with moderate LVH, trivial Gabriel, mild dilatation of aortic root at 43 mm - Echo  10/1823: EF 35-40%, global LV hypokinesis, severely dilated LV internal cavity size, mild LVH, small pericardial effusion, mild Gabriel. - will increase carvedilol  to 6.25mg  BID - continue Farxiga  10mg  daily - continue Entresto  24/26mg  bid - continue spiro 25mg  daily - continue torsemide  20mg  daily - continue potassium 20meq every other day - sent for 80mg  IV lasix / 40meq PO potassium 2 days ago but does not feel any better or different - continue to use Furoscix  if weight >= 250 although he says that this dehydrates him - consider Cardiomems to help volume management as needed => his insurance won't cover - saw AD[facility] provider (Bensimhon) 09/24 - BNP 12/10/23 reviewed and  was 148.9   2.  CAD/Elevated Troponin and chronic CP:  - H/o CAD  - RHC/ LHC 08/19/23:     1. Relatively stable appearance of the coronary arteries since last catheterization in 2021.  Chronically occluded proximal RCA now has antegrade flow, though I suspect this represents bridging collaterals.  Left-to-right collateral remain as well as moderate LAD and LCx disease.    2. Mildly elevated left heart filling pressures (LVEDP 20 mmHg, PCWP 18 mmHg).    3. Moderate pulmonary hypertension (mean PA 41 mmHg, PVR 5.3 WU).    4. Moderately reduced Fick cardiac output/index (CO 4.3 L/min, CI 1.9 L/min/m^2). - continue rosuvastatin  20mg  daily - continue isosorbide  MN 15mg  daily - saw cardiology Gennaro Khat) 01/25 - lipid panel 12/05/23: LDL 54, triglycerides 216   3.  HTN:  - BP 113/70 - saw PCP ([name]) 04/25 - BMP 12/10/23 reviewed: sodium 132, potassium 4.2, creatinine 1.42 & GFR 59   4.  H/o PE/Chronic anticoagulation:  - continue xarelto  20mg  daily - no recent bleeding   5.  DM2- - saw nephrology (Korrapati) 08/23  - A1c 07/11/23 reviewed and was 6.6%   6. OSA- - Sleep study 3/22: AHI 28  - sleep study 12/23; AHI 45.1 - sleep study 11/24 AHI 70.0 - wearing CPAP nightly unless feeling very congested - saw pulmonology  Gaynell Keeler) 11/24  7: AF- - saw EP (Riddle) 02/25 - cardioverted 11/06/23 - continue amiodarone  400mg  BID taper - continue ASA 81mg  daily - continue xarelto  20mg  daily - EKG today is NSR - currently wearing zio monitor   Informed patient that I had reached out to EP at last visit to keep them informed. Patient says that he may stop by their office after leaving here. If symptoms persist or do not improve, he should go to the ED. He verbalizes understanding but says that he's trying to avoid going back to the ED if possible.   Charlette Console, FNP 12/11/23

## 2023-12-11 NOTE — Telephone Encounter (Signed)
 Called to confirm/remind patient of their appointment at the [facility] on 12/12/23.   Appointment:   [x] Confirmed  [] Left mess   [] No answer/No voice mail  [] VM Full/unable to leave message  [] Phone not in service  Patient reminded to bring all medications and/or complete list.  Confirmed patient has transportation. Gave directions, instructed to utilize valet parking.

## 2023-12-12 ENCOUNTER — Encounter: Admitting: Family

## 2023-12-12 ENCOUNTER — Other Ambulatory Visit: Payer: Self-pay

## 2023-12-12 ENCOUNTER — Telehealth: Payer: Self-pay | Admitting: Cardiovascular Disease

## 2023-12-12 ENCOUNTER — Telehealth: Payer: Self-pay

## 2023-12-12 ENCOUNTER — Other Ambulatory Visit: Payer: Self-pay | Admitting: Pharmacist

## 2023-12-12 ENCOUNTER — Encounter: Payer: Self-pay | Admitting: Intensive Care

## 2023-12-12 ENCOUNTER — Ambulatory Visit: Attending: Family | Admitting: Family

## 2023-12-12 ENCOUNTER — Emergency Department

## 2023-12-12 ENCOUNTER — Encounter: Payer: Self-pay | Admitting: Family

## 2023-12-12 ENCOUNTER — Emergency Department
Admission: EM | Admit: 2023-12-12 | Discharge: 2023-12-12 | Disposition: A | Discharge status: Home / Self Care | Attending: Emergency Medicine | Admitting: Emergency Medicine

## 2023-12-12 VITALS — BP 113/70 | HR 76 | Wt 233.0 lb

## 2023-12-12 DIAGNOSIS — I272 Pulmonary hypertension, unspecified: Secondary | ICD-10-CM | POA: Insufficient documentation

## 2023-12-12 DIAGNOSIS — I10 Essential (primary) hypertension: Secondary | ICD-10-CM

## 2023-12-12 DIAGNOSIS — Z7901 Long term (current) use of anticoagulants: Secondary | ICD-10-CM | POA: Diagnosis not present

## 2023-12-12 DIAGNOSIS — Z86711 Personal history of pulmonary embolism: Secondary | ICD-10-CM | POA: Insufficient documentation

## 2023-12-12 DIAGNOSIS — I2510 Atherosclerotic heart disease of native coronary artery without angina pectoris: Secondary | ICD-10-CM | POA: Insufficient documentation

## 2023-12-12 DIAGNOSIS — E1122 Type 2 diabetes mellitus with diabetic chronic kidney disease: Secondary | ICD-10-CM

## 2023-12-12 DIAGNOSIS — R0789 Other chest pain: Secondary | ICD-10-CM | POA: Insufficient documentation

## 2023-12-12 DIAGNOSIS — N189 Chronic kidney disease, unspecified: Secondary | ICD-10-CM | POA: Diagnosis not present

## 2023-12-12 DIAGNOSIS — G4733 Obstructive sleep apnea (adult) (pediatric): Secondary | ICD-10-CM | POA: Diagnosis not present

## 2023-12-12 DIAGNOSIS — N1832 Chronic kidney disease, stage 3b: Secondary | ICD-10-CM

## 2023-12-12 DIAGNOSIS — Z7985 Long-term (current) use of injectable non-insulin antidiabetic drugs: Secondary | ICD-10-CM | POA: Insufficient documentation

## 2023-12-12 DIAGNOSIS — K7460 Unspecified cirrhosis of liver: Secondary | ICD-10-CM | POA: Diagnosis not present

## 2023-12-12 DIAGNOSIS — I509 Heart failure, unspecified: Secondary | ICD-10-CM | POA: Diagnosis not present

## 2023-12-12 DIAGNOSIS — I5022 Chronic systolic (congestive) heart failure: Secondary | ICD-10-CM | POA: Diagnosis not present

## 2023-12-12 DIAGNOSIS — Z5986 Financial insecurity: Secondary | ICD-10-CM | POA: Diagnosis not present

## 2023-12-12 DIAGNOSIS — I428 Other cardiomyopathies: Secondary | ICD-10-CM | POA: Diagnosis not present

## 2023-12-12 DIAGNOSIS — I480 Paroxysmal atrial fibrillation: Secondary | ICD-10-CM

## 2023-12-12 DIAGNOSIS — Z7982 Long term (current) use of aspirin: Secondary | ICD-10-CM | POA: Diagnosis not present

## 2023-12-12 DIAGNOSIS — J4489 Other specified chronic obstructive pulmonary disease: Secondary | ICD-10-CM | POA: Insufficient documentation

## 2023-12-12 DIAGNOSIS — Z79899 Other long term (current) drug therapy: Secondary | ICD-10-CM | POA: Diagnosis not present

## 2023-12-12 DIAGNOSIS — R002 Palpitations: Secondary | ICD-10-CM | POA: Insufficient documentation

## 2023-12-12 DIAGNOSIS — Z87891 Personal history of nicotine dependence: Secondary | ICD-10-CM | POA: Diagnosis not present

## 2023-12-12 DIAGNOSIS — K7581 Nonalcoholic steatohepatitis (NASH): Secondary | ICD-10-CM | POA: Insufficient documentation

## 2023-12-12 DIAGNOSIS — Z794 Long term (current) use of insulin: Secondary | ICD-10-CM | POA: Diagnosis not present

## 2023-12-12 DIAGNOSIS — I130 Hypertensive heart and chronic kidney disease with heart failure and stage 1 through stage 4 chronic kidney disease, or unspecified chronic kidney disease: Secondary | ICD-10-CM | POA: Diagnosis not present

## 2023-12-12 DIAGNOSIS — I25110 Atherosclerotic heart disease of native coronary artery with unstable angina pectoris: Secondary | ICD-10-CM | POA: Diagnosis not present

## 2023-12-12 DIAGNOSIS — J449 Chronic obstructive pulmonary disease, unspecified: Secondary | ICD-10-CM | POA: Diagnosis not present

## 2023-12-12 DIAGNOSIS — I2699 Other pulmonary embolism without acute cor pulmonale: Secondary | ICD-10-CM | POA: Diagnosis not present

## 2023-12-12 DIAGNOSIS — E119 Type 2 diabetes mellitus without complications: Secondary | ICD-10-CM | POA: Diagnosis not present

## 2023-12-12 DIAGNOSIS — M25512 Pain in left shoulder: Secondary | ICD-10-CM | POA: Diagnosis not present

## 2023-12-12 DIAGNOSIS — R079 Chest pain, unspecified: Secondary | ICD-10-CM

## 2023-12-12 DIAGNOSIS — G8929 Other chronic pain: Secondary | ICD-10-CM | POA: Diagnosis not present

## 2023-12-12 DIAGNOSIS — Z7984 Long term (current) use of oral hypoglycemic drugs: Secondary | ICD-10-CM | POA: Insufficient documentation

## 2023-12-12 DIAGNOSIS — I4891 Unspecified atrial fibrillation: Secondary | ICD-10-CM | POA: Insufficient documentation

## 2023-12-12 DIAGNOSIS — Z95810 Presence of automatic (implantable) cardiac defibrillator: Secondary | ICD-10-CM | POA: Insufficient documentation

## 2023-12-12 LAB — BASIC METABOLIC PANEL WITH GFR
Anion gap: 7 (ref 5–15)
BUN: 27 mg/dL — ABNORMAL HIGH (ref 6–20)
CO2: 22 mmol/L (ref 22–32)
Calcium: 9.5 mg/dL (ref 8.9–10.3)
Chloride: 106 mmol/L (ref 98–111)
Creatinine, Ser: 1.33 mg/dL — ABNORMAL HIGH (ref 0.61–1.24)
GFR, Estimated: >60 mL/min (ref >60)
Glucose, Bld: 138 mg/dL — ABNORMAL HIGH (ref 70–99)
Potassium: 4.3 mmol/L (ref 3.5–5.1)
Sodium: 135 mmol/L (ref 135–145)

## 2023-12-12 LAB — CBC
HCT: 49.4 % (ref 39.0–52.0)
Hemoglobin: 15.4 g/dL (ref 13.0–17.0)
MCH: 25.3 pg — ABNORMAL LOW (ref 26.0–34.0)
MCHC: 31.2 g/dL (ref 30.0–36.0)
MCV: 81.1 fL (ref 80.0–100.0)
Platelets: 269 10*3/uL (ref 150–400)
RBC: 6.09 MIL/uL — ABNORMAL HIGH (ref 4.22–5.81)
RDW: 17.8 % — ABNORMAL HIGH (ref 11.5–15.5)
WBC: 8.5 10*3/uL (ref 4.0–10.5)
nRBC: 0 % (ref 0.0–0.2)

## 2023-12-12 LAB — TROPONIN I (HIGH SENSITIVITY): Troponin I (High Sensitivity): 20 ng/L — ABNORMAL HIGH (ref <18)

## 2023-12-12 NOTE — Telephone Encounter (Signed)
**  PT IS WAITING IN LOBBY

## 2023-12-12 NOTE — ED Notes (Addendum)
 Pt called x1 for repeat VS and troponin. No answer and pt not visualized in the lobby.

## 2023-12-12 NOTE — Telephone Encounter (Signed)
 The patient presented to the office stating he hasn't been feeling well and complained of chest pain and shortness of breath. He mentioned that he currently feels as though his heart is being squeezed and is nervous about going back into Afib.   Based on his current symptoms, the nurse recommended that the patient be evaluated in the emergency room. Nurse offered to transport the patient via wheelchair, but the patient declined and stated he would drive himself to the ER.

## 2023-12-12 NOTE — Telephone Encounter (Signed)
 Copied from CRM [id number]. Topic: Clinical - Prescription Issue >> Dec 12, 2023 10:03 AM Phil Braun wrote: Reason for CRM:   Ref Continuous Glucose Sensor (FREESTYLE LIBRE 3 PLUS SENSOR) MISC  Pt went to get his rx and they advised him that he needs a prior authorization for this and they dont have one. Could you please check on this and advise.

## 2023-12-12 NOTE — ED Triage Notes (Signed)
 Patient presents with left sided chest pain that started Monday. Describes as squeezing and pressure.  History CHF and a-fib and reports he is compliant with medication

## 2023-12-12 NOTE — Patient Instructions (Signed)
 Increase carvedilol  to 6.25mg  twice daily. This will be 2 tablets in the morning and 2 tablets in the evening.

## 2023-12-12 NOTE — ED Provider Notes (Addendum)
 [facility] Provider Note    Event Date/Time   First MD Initiated Contact with Patient 12/12/23 1457     (approximate)   History   Chest Pain   HPI  Gabriel [name]. is a 53 y.o. male with known history of CAD, CHF, atrial fibrillation, COPD, diabetes, who presents with chest discomfort.  Patient went to heart failure clinic today and reports that his medications were adjusted slightly.  He went to fill medications and was talking to a nurse who became concerned about his chest discomfort, he reports this has been ongoing for quite some time but they referred him to the emergency department.     Physical Exam   Triage Vital Signs: ED Triage Vitals  Encounter Vitals Group     BP 12/12/23 1147 124/73     Systolic BP Percentile --      Diastolic BP Percentile --      Pulse Rate 12/12/23 1147 80     Resp 12/12/23 1147 18     Temp 12/12/23 1149 (!) 97.5 F (36.4 C)     Temp Source 12/12/23 1149 Oral     SpO2 12/12/23 1147 97 %     Weight 12/12/23 1147 105.7 kg (233 lb)     Height 12/12/23 1147 1.803 m (5\' 11" )     Head Circumference --      Peak Flow --      Pain Score 12/12/23 1147 8     Pain Loc --      Pain Education --      Exclude from Growth Chart --     Most recent vital signs: Vitals:   12/12/23 1147 12/12/23 1149  BP: 124/73   Pulse: 80   Resp: 18   Temp:  (!) 97.5 F (36.4 C)  SpO2: 97%      General: Awake, no distress.  CV:  Good peripheral perfusion.  Regular rate and rhythm, no irregularity at this time Resp:  Normal effort.  Clear to auscultation bilaterally Abd:  No distention.  Other:  No lower extremity edema   ED Results / Procedures / Treatments   Labs (all labs ordered are listed, but only abnormal results are displayed) Labs Reviewed  BASIC METABOLIC PANEL WITH GFR - Abnormal; Notable for the following components:      Result Value   Glucose, Bld 138 (*)    BUN 27 (*)    Creatinine, Ser 1.33 (*)     All other components within normal limits  CBC - Abnormal; Notable for the following components:   RBC 6.09 (*)    MCH 25.3 (*)    RDW 17.8 (*)    All other components within normal limits  TROPONIN I (HIGH SENSITIVITY) - Abnormal; Notable for the following components:   Troponin I (High Sensitivity) 20 (*)    All other components within normal limits     EKG  ED ECG REPORT I, Bryson Carbine, the attending physician, personally viewed and interpreted this ECG.  Date: 12/12/2023  Rhythm: normal sinus rhythm QRS Axis: normal Intervals: normal ST/T Wave abnormalities: normal Narrative Interpretation: Normal sinus rhythm at this time, no A-fib    RADIOLOGY Chest x-ray viewed interpret by me, no pulmonary edema    PROCEDURES:  Critical Care performed:   Procedures   MEDICATIONS ORDERED IN ED: Medications - No data to display   IMPRESSION / MDM / ASSESSMENT AND PLAN / ED COURSE  I reviewed the triage vital  signs and the nursing notes. Patient's presentation is most consistent with acute presentation with potential threat to life or bodily function.  Patient with significant history of CAD, multiple comorbidities presents with ongoing chest discomfort which he reports has been present for nearly a week.  He states that he only came to the emergency department because the nurses insisted  Likely his high sensitive troponin is in line with prior levels, he has a chronically elevated high sensitive troponin.  His EKG looks very normal today.  Chest x-ray without evidence of edema, he is compliant with his medications.  He is not in atrial fibrillation.  I offered admission given his multiple comorbidities, CAD and chest discomfort however he refused, he states he feels comfortable going home and would like to be discharged, he knows that he can always return if any worsening.  He has follow-up with Dr. Gollan and Dr. Marven Slimmer next week      FINAL CLINICAL IMPRESSION(S)  / ED DIAGNOSES   Final diagnoses:  Nonspecific chest pain     Rx / DC Orders   ED Discharge Orders     None        Note:  This document was prepared using Dragon voice recognition software and may include unintentional dictation errors.   Bryson Carbine, MD 12/12/23 Gabriel [name]    Bryson Carbine, MD 12/12/23 [phone]

## 2023-12-12 NOTE — Progress Notes (Signed)
   12/12/2023  Patient ID: Gabriel [name]., male   DOB: 31-Oct-1970, 53 y.o.   MRN: [id number]  Receive a message from patient requesting assistance with Prior Authorization for his Freestyle Libre 3 Plus continuous glucose monitor sensors. Submit request for PA via covermymeds on behalf of patient today. - PA approved through 12/11/2024 - Follow up with [facility] to provide update  Follow up with patient who confirms picking up from [facility]    Arthur Lash, PharmD, [facility] [phone]

## 2023-12-12 NOTE — ED Notes (Signed)
Pt d/c home per EDP order. Discharge summary reviewed, pt verbalizes understanding. NAD.

## 2023-12-12 NOTE — Telephone Encounter (Signed)
   Pt c/o of Chest Pain: STAT if active CP, including tightness, pressure, jaw pain, radiating pain to shoulder/upper arm/back, CP unrelieved by Nitro. Symptoms reported of SOB, nausea, vomiting, sweating.  1. Are you having CP right now? yes    2. Are you experiencing any other symptoms (ex. SOB, nausea, vomiting, sweating)? SOB and chest tightness   3. Is your CP continuous or coming and going? continuous   4. Have you taken Nitroglycerin ? no   5. How long have you been experiencing CP?  The past five days    6. If NO CP at time of call then end call with telling Pt to call back or call 911 if Chest pain returns prior to return call from triage team.

## 2023-12-15 DIAGNOSIS — G8929 Other chronic pain: Secondary | ICD-10-CM | POA: Diagnosis not present

## 2023-12-15 DIAGNOSIS — M25512 Pain in left shoulder: Secondary | ICD-10-CM | POA: Diagnosis not present

## 2023-12-15 DIAGNOSIS — M25612 Stiffness of left shoulder, not elsewhere classified: Secondary | ICD-10-CM | POA: Diagnosis not present

## 2023-12-15 DIAGNOSIS — M6281 Muscle weakness (generalized): Secondary | ICD-10-CM | POA: Diagnosis not present

## 2023-12-15 DIAGNOSIS — Z9889 Other specified postprocedural states: Secondary | ICD-10-CM | POA: Diagnosis not present

## 2023-12-16 ENCOUNTER — Telehealth: Payer: Self-pay

## 2023-12-16 ENCOUNTER — Encounter: Payer: Self-pay | Admitting: Oncology

## 2023-12-16 ENCOUNTER — Other Ambulatory Visit (HOSPITAL_COMMUNITY): Payer: Self-pay

## 2023-12-16 NOTE — Telephone Encounter (Signed)
/  Pharmacy Patient Advocate Encounter  Received notification from [facility] that Prior Authorization for FreeStyle Libre 3 Plus Senso has been APPROVED from 12/16/23 to 12/15/24   PA #/Case ID/Reference #: [id number]

## 2023-12-16 NOTE — Telephone Encounter (Signed)
 Pharmacy Patient Advocate Encounter   Received notification from Pt Calls Messages that prior authorization for FreeStyle Libre 3 Plus Sensor is required/requested.   Insurance verification completed.   The patient is insured through [facility] .   Per test claim: PA required; PA submitted to above mentioned insurance via CoverMyMeds Key/confirmation #/EOC [id number] Status is pending

## 2023-12-17 DIAGNOSIS — M6281 Muscle weakness (generalized): Secondary | ICD-10-CM | POA: Diagnosis not present

## 2023-12-17 DIAGNOSIS — M25612 Stiffness of left shoulder, not elsewhere classified: Secondary | ICD-10-CM | POA: Diagnosis not present

## 2023-12-17 DIAGNOSIS — M25512 Pain in left shoulder: Secondary | ICD-10-CM | POA: Diagnosis not present

## 2023-12-17 DIAGNOSIS — G8929 Other chronic pain: Secondary | ICD-10-CM | POA: Diagnosis not present

## 2023-12-17 DIAGNOSIS — Z9889 Other specified postprocedural states: Secondary | ICD-10-CM | POA: Diagnosis not present

## 2023-12-19 ENCOUNTER — Encounter: Admitting: Cardiology

## 2023-12-22 DIAGNOSIS — G8929 Other chronic pain: Secondary | ICD-10-CM | POA: Diagnosis not present

## 2023-12-22 DIAGNOSIS — M25512 Pain in left shoulder: Secondary | ICD-10-CM | POA: Diagnosis not present

## 2023-12-23 NOTE — Progress Notes (Unsigned)
 Electrophysiology Office Follow up Visit Note:    Date:  12/24/2023   ID:  Gabriel Every., DOB 1971/07/02, MRN [id number]  PCP:  Gabriel Farr, FNP  [facility] Cardiologist:  Gabriel Boyden, MD  [facility] Electrophysiologist:  Gabriel Byes, MD    Interval History:     Gabriel Hulick. is a 53 y.o. male who presents for a follow up visit.   I last saw the patient in March when he was hospitalized at [facility] with atrial fibrillation with rapid ventricular rates.  His history includes chronic systolic heart failure with an ICD in situ, CKD 3, COPD, pulmonary embolism on Xarelto , obstructive sleep apnea on CPAP.  While he was in the hospital we loaded him on amiodarone  and planned to revisit candidacy for catheter ablation if his pulmonary status improved.  He was seen in the emergency department on December 12, 2023 with chest discomfort.  He was evaluated and discharged from the emergency department.  Today he is doing okay.  He wore a monitor recently that did not show any atrial fibrillation.  He tells me he wakes up sometimes at night and feels like his heart is racing or is beating strongly.  No syncope or presyncope.  His breathing is not as bad as it was when he was in the hospital but it has worsened over the last month and a half.        Past medical, surgical, social and family history were reviewed.  ROS:   Please see the history of present illness.    All other systems reviewed and are negative.  EKGs/Labs/Other Studies Reviewed:    The following studies were reviewed today:  Dec 24, 2023 in clinic device interrogation personally reviewed Battery longevity 83% A-fib burden 12% No recent high-voltage therapies  Dec 22, 2023 ZIO monitor Heart rate 47-1 82, average 76 Rare supraventricular and ventricular ectopy No atrial fibrillation  December 15, 2023 EKG shows sinus rhythm.   EKG Interpretation Date/Time:  Wednesday Dec 24 2023  11:38:25 EDT Ventricular Rate:  81 PR Interval:  174 QRS Duration:  102 QT Interval:  426 QTC Calculation: 494 R Axis:   91  Text Interpretation: Normal sinus rhythm Confirmed by Gabriel [name] [phone]) on 12/24/2023 11:42:05 AM    Physical Exam:    VS:  BP 118/74   Pulse 81   Ht 5\' 11"  (1.803 m)   Wt 235 lb (106.6 kg)   SpO2 95%   BMI 32.78 kg/m     Wt Readings from Last 3 Encounters:  12/24/23 235 lb (106.6 kg)  12/12/23 233 lb (105.7 kg)  12/12/23 233 lb (105.7 kg)     GEN: no distress CARD: RRR, 2 out of 4 decrescendo diastolic murmur loudest at the upper sternal borders consistent with AI RESP: No IWOB. CTAB.      ASSESSMENT:    1. Chronic systolic heart failure (HCC)   2. Persistent atrial fibrillation (HCC)   3. Encounter for long-term (current) use of high-risk medication    PLAN:    In order of problems listed above:  #Aortic insufficiency/regurgitation Significant murmur on exam.  I would like to order a repeat echo to assess his LV dimensions and severity of AI now that he has been back in sinus rhythm.  I am concerned that his aortic valve may need intervention.  If his EF is still low and LV still dilated, favor surgical eval.  He is going to see  Gabriel [name] after his echo and me in 3 months.  #Persistent atrial fibrillation #High risk med monitoring-amiodarone  On Xarelto  for stroke prophylaxis Maintaining sinus rhythm  I discussed treatment options with the patient for his atrial fibrillation.  I do think rhythm control is indicated.  He is currently on amiodarone  but given his young age, this is not an ideal long-term solution.  Catheter ablation may be the best strategy moving forward but his aortic valve may require intervention.  If his aortic valve requires intervention, could plan for maze and left atrial appendage ligation at the time of surgery.  #Chronic systolic heart failure NYHA class II-III.  Follows with heart failure clinic. Appears  relatively euvolemic today Continue spironolactone , torsemide , Entresto , Farxiga , Coreg   Echo to reevaluate AI, LV function and LV dimensions Follow-up with Dr. Jerelene [name] after echo Follow-up with me no later than 3 months   Signed, Gabriel Liner, MD, [facility] 12/24/2023 11:42 AM    Electrophysiology [facility]

## 2023-12-24 ENCOUNTER — Encounter: Payer: Self-pay | Admitting: Cardiology

## 2023-12-24 ENCOUNTER — Ambulatory Visit: Attending: Cardiology | Admitting: Cardiology

## 2023-12-24 VITALS — BP 118/74 | HR 81 | Ht 71.0 in | Wt 235.0 lb

## 2023-12-24 DIAGNOSIS — Z95810 Presence of automatic (implantable) cardiac defibrillator: Secondary | ICD-10-CM | POA: Diagnosis not present

## 2023-12-24 DIAGNOSIS — Z86718 Personal history of other venous thrombosis and embolism: Secondary | ICD-10-CM | POA: Diagnosis not present

## 2023-12-24 DIAGNOSIS — Z79899 Other long term (current) drug therapy: Secondary | ICD-10-CM | POA: Insufficient documentation

## 2023-12-24 DIAGNOSIS — M25512 Pain in left shoulder: Secondary | ICD-10-CM | POA: Diagnosis not present

## 2023-12-24 DIAGNOSIS — Z7901 Long term (current) use of anticoagulants: Secondary | ICD-10-CM | POA: Insufficient documentation

## 2023-12-24 DIAGNOSIS — I5022 Chronic systolic (congestive) heart failure: Secondary | ICD-10-CM | POA: Diagnosis not present

## 2023-12-24 DIAGNOSIS — I4819 Other persistent atrial fibrillation: Secondary | ICD-10-CM | POA: Diagnosis not present

## 2023-12-24 DIAGNOSIS — N183 Chronic kidney disease, stage 3 unspecified: Secondary | ICD-10-CM | POA: Diagnosis not present

## 2023-12-24 DIAGNOSIS — J449 Chronic obstructive pulmonary disease, unspecified: Secondary | ICD-10-CM | POA: Insufficient documentation

## 2023-12-24 DIAGNOSIS — G4733 Obstructive sleep apnea (adult) (pediatric): Secondary | ICD-10-CM | POA: Diagnosis not present

## 2023-12-24 DIAGNOSIS — I351 Nonrheumatic aortic (valve) insufficiency: Secondary | ICD-10-CM | POA: Insufficient documentation

## 2023-12-24 DIAGNOSIS — G8929 Other chronic pain: Secondary | ICD-10-CM | POA: Diagnosis not present

## 2023-12-24 DIAGNOSIS — M25612 Stiffness of left shoulder, not elsewhere classified: Secondary | ICD-10-CM | POA: Diagnosis not present

## 2023-12-24 DIAGNOSIS — M6281 Muscle weakness (generalized): Secondary | ICD-10-CM | POA: Diagnosis not present

## 2023-12-24 DIAGNOSIS — Z9889 Other specified postprocedural states: Secondary | ICD-10-CM | POA: Diagnosis not present

## 2023-12-24 NOTE — Patient Instructions (Addendum)
 Medication Instructions:  Your physician recommends that you continue on your current medications as directed. Please refer to the Current Medication list given to you today.  *If you need a refill on your cardiac medications before your next appointment, please call your pharmacy*  Testing/Procedures: Echocardiogram  Your physician has requested that you have an echocardiogram. Echocardiography is a painless test that uses sound waves to create images of your heart. It provides your doctor with information about the size and shape of your heart and how well your heart's chambers and valves are working. This procedure takes approximately one hour. There are no restrictions for this procedure. Please do NOT wear cologne, perfume, aftershave, or lotions (deodorant is allowed). Please arrive 15 minutes prior to your appointment time.  Please note: We ask at that you not bring children with you during ultrasound (echo/ vascular) testing. Due to room size and safety concerns, children are not allowed in the ultrasound rooms during exams. Our front office staff cannot provide observation of children in our lobby area while testing is being conducted. An adult accompanying a patient to their appointment will only be allowed in the ultrasound room at the discretion of the ultrasound technician under special circumstances. We apologize for any inconvenience.  Follow-Up: At [facility], you and your health needs are our priority.  As part of our continuing mission to provide you with exceptional heart care, we have created designated Provider Care Teams.  These Care Teams include your primary Cardiologist (physician) and Advanced Practice Providers (APPs -  Physician Assistants and Nurse Practitioners) who all work together to provide you with the care you need, when you need it.   Your next appointment:    Reschedule follow up with Dr. Gollan until after echocardiogram  Follow up with Dr.  Marven Slimmer in 3 months

## 2023-12-25 ENCOUNTER — Encounter (HOSPITAL_COMMUNITY): Payer: Self-pay

## 2023-12-25 ENCOUNTER — Other Ambulatory Visit: Payer: Self-pay

## 2023-12-26 ENCOUNTER — Ambulatory Visit: Payer: Medicaid Other | Admitting: Cardiovascular Disease

## 2023-12-29 DIAGNOSIS — G8929 Other chronic pain: Secondary | ICD-10-CM | POA: Diagnosis not present

## 2023-12-29 DIAGNOSIS — M25512 Pain in left shoulder: Secondary | ICD-10-CM | POA: Diagnosis not present

## 2023-12-29 LAB — CUP PACEART INCLINIC DEVICE CHECK
Date Time Interrogation Session: 20250507085153
Implantable Lead Connection Status: 753985
Implantable Lead Implant Date: 20231002
Implantable Lead Location: 753862
Implantable Lead Model: 3501
Implantable Lead Serial Number: 221766
Implantable Pulse Generator Implant Date: 20231002
Pulse Gen Serial Number: 189685

## 2023-12-30 ENCOUNTER — Encounter: Payer: Self-pay | Admitting: Cardiology

## 2023-12-30 NOTE — Addendum Note (Signed)
 Encounter addended by: Stan Eans, RN on: 12/30/2023 12:14 PM  Actions taken: Imaging Exam ended

## 2023-12-31 ENCOUNTER — Ambulatory Visit (INDEPENDENT_AMBULATORY_CARE_PROVIDER_SITE_OTHER): Admitting: Gastroenterology

## 2023-12-31 ENCOUNTER — Encounter: Payer: Self-pay | Admitting: Gastroenterology

## 2023-12-31 VITALS — BP 128/83 | HR 90 | Temp 98.2°F | Wt 238.0 lb

## 2023-12-31 DIAGNOSIS — F1090 Alcohol use, unspecified, uncomplicated: Secondary | ICD-10-CM | POA: Diagnosis not present

## 2023-12-31 DIAGNOSIS — K7402 Hepatic fibrosis, advanced fibrosis: Secondary | ICD-10-CM

## 2023-12-31 DIAGNOSIS — K529 Noninfective gastroenteritis and colitis, unspecified: Secondary | ICD-10-CM

## 2023-12-31 DIAGNOSIS — K7581 Nonalcoholic steatohepatitis (NASH): Secondary | ICD-10-CM | POA: Diagnosis not present

## 2023-12-31 DIAGNOSIS — M25612 Stiffness of left shoulder, not elsewhere classified: Secondary | ICD-10-CM | POA: Diagnosis not present

## 2023-12-31 DIAGNOSIS — K7400 Hepatic fibrosis, unspecified: Secondary | ICD-10-CM

## 2023-12-31 DIAGNOSIS — M25512 Pain in left shoulder: Secondary | ICD-10-CM | POA: Diagnosis not present

## 2023-12-31 DIAGNOSIS — K3184 Gastroparesis: Secondary | ICD-10-CM | POA: Diagnosis not present

## 2023-12-31 DIAGNOSIS — G8929 Other chronic pain: Secondary | ICD-10-CM | POA: Diagnosis not present

## 2023-12-31 DIAGNOSIS — M6281 Muscle weakness (generalized): Secondary | ICD-10-CM | POA: Diagnosis not present

## 2023-12-31 DIAGNOSIS — Z9889 Other specified postprocedural states: Secondary | ICD-10-CM | POA: Diagnosis not present

## 2023-12-31 NOTE — Progress Notes (Signed)
 Remote ICD transmission.

## 2023-12-31 NOTE — Progress Notes (Addendum)
 Karma Oz, MD [address]  [address]  [place], [place] [address]  Main: [phone]  Fax: [phone]    Gastroenterology Consultation  Referring Provider:     Tasia Farr, FNP Primary Care Physician:  Tasia Farr, FNP Primary Gastroenterologist:  Dr. Karma Oz Reason for Consultation: Chronic diarrhea, gastroparesis, MASH fibrosis of liver        HPI:   Gabriel [name]. is a 53 y.o. male referred by Tasia Farr, FNP  for consultation & management of chronic history of upper abdominal pain associated with sulfur  burps, postprandial abdominal discomfort, burping and regurgitation.  This has been ongoing for at least a year.  He has multiple comorbidities including metabolic syndrome, CHF, CKD, compensated cirrhosis of liver.  Patient used to smoke and drink alcohol, quit both at least 2 to 3 years ago.  He has defibrillator in place.  He is trying to manage his diabetes by following the diet and adherence to his medications.  Follow-up visit 09/30/2022 Patient is here for follow-up of ongoing symptoms as described during his initial visit.  He underwent upper endoscopy which revealed gastric erosions and superficial clean-based gastric ulcer.  No evidence of H. pylori.  Colonoscopy was unremarkable.  I started him on omeprazole  40 mg twice daily before meals which he has been taking.  Patient reports that he noticed temporary improvement in his symptoms but since December, they have been the same as before.  He also reports change in bowel habits, alternating between normal and loose bowel movements.  He has stopped smoking and is not drinking alcohol.  Continues to take Ozempic  along with insulin   Follow-up visit 04/14/2023 Patient is here for follow-up of symptoms of dyspepsia.  EGD was unremarkable.  Gastric emptying study confirmed gastroparesis.  He was on Reglan  which improved his symptoms.  Currently, he has been off Reglan .  He does report that  his blood sugars have been fairly regulated, trying to eat healthy and overall his symptoms are less frequent.  He is HbA1c is 7.2.  His main concern today is 2 months of ongoing diarrhea, about 4-5 episodes daily and sometimes at night, associated with abdominal bloating, without any rectal bleeding.  Denies any weight loss.  Follow-up visit 12/31/2023 Gabriel [name]. is here for follow-up of Gabriel [name] fibrosis of liver.  He is on Resmetirom  100 mg daily and tolerating it well.  He his main concern today is ongoing symptoms of nausea, sulfur  burps, belching as well as watery diarrheal episodes.  He has been gaining weight, denies any swelling of legs, has no abdominal distention.  He admits to eating salads with ranch dressing twice daily, consumes sausage, bacon on a regular basis.  Minimal activity due to his cardiac issues.  He does not recall trying Reglan  in the past for short period of time.  NSAIDs: None  Antiplts/Anticoagulants/Anti thrombotics: Xarelto   GI Procedures:  EGD and colonoscopy 08/01/2022 DIAGNOSIS:  A. 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  - NEGATIVE FOR H. PYLORI, INTESTINAL METAPLASIA, DYSPLASIA, AND  MALIGNANCY.   B. COLON POLYP X 2, DESCENDING; COLD SNARE:  - TUBULAR ADENOMA (1).  - HYPERPLASTIC POLYP (1).  - NEGATIVE FOR HIGH-GRADE DYSPLASIA AND MALIGNANCY.  He denies family history of GI malignancy  Past Medical History:  Diagnosis Date   Aortic root dilation (HCC) 11/28/2016   a.) TTE 11/28/2016: 43 mm; b.) TTE 11/23/2019: 38 mm; c.) TTE  08/14/2022: 40 mm; d.) TTE 03/08/2023: 43 mm   Arteriosclerosis of right carotid artery    a.) carotid doppler 04/29/2017: <50% RICA   Bronchitis 07/2023   CAD (coronary artery disease)    a.) MV 04/2015: low risk; b.) LHC 01/06/2017: minor irregs in LAD/Diag/LCX/OM, RCA 40p/m/d; c.) R/LHC 06/13/2020: 50% dLAD, 30% p-mLCx, 40% OM1, 100% pRCA (L-R collats) - med mgmt; d.) R/LHC  08/19/2023: 50% dLAD, 30% p-mLCx, 40% OM1, 100% pRCA (L-R collats), 50% p-mRCA - med mgmt   Chest wall pain, chronic    CHF (congestive heart failure) (HCC)    Chronic 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    Chronic Troponin Elevation    CKD (chronic kidney disease), stage II-III    COPD (chronic obstructive pulmonary disease) (HCC)    Depression    DM (diabetes mellitus), type 2 (HCC)    HFrEF (heart failure with reduced ejection fraction) (HCC)    a.) EF 03/20/2015: EF 45-50%; b.) MV 05/19/2015: EF 30-44%; c.) TTE 01/04/2016: EF 20-25%; d.) TTE 02/29/2016: EF 30-35%; e.) TTE 11/28/2016: EF 40-45%; f.) LHC 01/06/2017: EF 25-35%; g.) TTE 04/29/2017: EF 45-50%; h.) TTE 06/26/2019: EF 30-35%; i.) TTE 11/23/2019: EF 25-30%; j.) TTE 06/08/2020: EF 35-40%; k.) TTE 05/21/2021: EF 40-45%; l.) TTE 08/14/2022: EF 40-45%; m.) TTE 03/08/2023: EF 40-45%   History of 2019 novel coronavirus disease (COVID-19) 03/01/2022   History of methicillin resistant staphylococcus aureus (MRSA)    Hyperlipidemia    Hypertension    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 12/2015 noted on tele-->amio;  b. 12/2015 Event monitor: no VT noted.   Obesity (BMI 30.0-34.9)    On rivaroxaban  therapy    OSA treated with BiPAP     Polyp of descending colon    Psoriasis    Psoriatic arthritis (HCC)    a.) Tx'd with ixekizumab   Pulmonary HTN (HCC) 02/29/2016   a.) TTE 02/29/2016: RVSP 47; b.) TTE 06/26/2019: RVSP 58.4; c.) TTE 12/08/2019: RVSP 40.8; d.) TTE 06/08/2020: RVSP 44.7; e.) R/LHC 06/08/2020: mRA 9, mPA 40, mPWCP 17, AO sat 98, PA sat 61, CO 3.1, CI 1.3, PVR 7.4, LVEDP 20; f.) R/LHC 08/18/2020: mRA 5, mPA 41, mPCWP 18, AO sat 92, PA sat 59, CO 4.3, CI 1.9, PVR 5.3   Recurrent pulmonary emboli (HCC) 06/07/2020   06/07/20: small bilateral PEs.  12/31/19: RUL and RLL PEs.   Stroke [facility])    pt states per mri in 2023-unsure of when stroke happened   Syncope    a. 01/2016 - felt to be vasovagal.   Thrombocytopenia (HCC)     Past Surgical History:  Procedure Laterality Date   CARDIOVERSION N/A 11/06/2023   Procedure: CARDIOVERSION;  Surgeon: Lauralee Poll, MD;  Location: [facility] ORS;  Service: Cardiovascular;  Laterality: N/A;  COLONOSCOPY WITH PROPOFOL  N/A 08/01/2022   Procedure: COLONOSCOPY WITH PROPOFOL ;  Surgeon: Selena Daily, MD;  Location: [facility] ENDOSCOPY;  Service: Gastroenterology;  Laterality: N/A;   ESOPHAGOGASTRODUODENOSCOPY (EGD) WITH PROPOFOL  N/A 08/01/2022   Procedure: ESOPHAGOGASTRODUODENOSCOPY (EGD) WITH PROPOFOL ;  Surgeon: Selena Daily, MD;  Location: [facility] ENDOSCOPY;  Service: Gastroenterology;  Laterality: N/A;   ESOPHAGOGASTRODUODENOSCOPY (EGD) WITH PROPOFOL  N/A 10/16/2022   Procedure: ESOPHAGOGASTRODUODENOSCOPY (EGD) WITH PROPOFOL ;  Surgeon: Selena Daily, MD;  Location: [facility] ENDOSCOPY;  Service: Gastroenterology;  Laterality: N/A;   FINGER AMPUTATION Left    Traumatic   LEFT HEART CATH AND CORONARY ANGIOGRAPHY N/A 01/06/2017   Procedure: Left Heart Cath and Coronary Angiography;  Surgeon: Wenona Hamilton, MD;  Location: [facility] INVASIVE CV LAB;  Service: Cardiovascular;  Laterality: N/A;   RIGHT/LEFT HEART CATH AND CORONARY ANGIOGRAPHY N/A 06/13/2020   Procedure:  RIGHT/LEFT HEART CATH AND CORONARY ANGIOGRAPHY;  Surgeon: Sammy Crisp, MD;  Location: [facility] INVASIVE CV LAB;  Service: Cardiovascular;  Laterality: N/A;   RIGHT/LEFT HEART CATH AND CORONARY ANGIOGRAPHY Bilateral 08/19/2023   Procedure: RIGHT/LEFT HEART CATH AND CORONARY ANGIOGRAPHY;  Surgeon: Sammy Crisp, MD;  Location: [facility] INVASIVE CV LAB;  Service: Cardiovascular;  Laterality: Bilateral;   SHOULDER ARTHROSCOPY WITH SUBACROMIAL DECOMPRESSION, ROTATOR CUFF REPAIR AND BICEP TENDON REPAIR Left 09/16/2023   Procedure: SHOULDER ARTHROSCOPY WITH DEBRIDEMENT, DECOMPRESSION, ROTATOR CUFF REPAIR AND BICEPS TENODESIS.;  Surgeon: Elner Hahn, MD;  Location: [facility] ORS;  Service: Orthopedics;  Laterality: Left;   SUBQ ICD IMPLANT N/A 05/20/2022   Procedure: SUBQ ICD IMPLANT;  Surgeon: Boyce Byes, MD;  Location: [facility] INVASIVE CV LAB;  Service: Cardiovascular;  Laterality: N/A;     Current Outpatient Medications:    Accu-Chek Softclix Lancets lancets, USE UP TO FOUR TIMES DAILY, Disp: , Rfl:    albuterol  (VENTOLIN  HFA) 108 (90 Base) MCG/ACT inhaler, Inhale 2 puffs into the lungs every 4 (four) hours as needed for wheezing or shortness of breath., Disp: 8 g, Rfl: 6   allopurinol  (ZYLOPRIM ) 300 MG tablet, Take 300 mg by mouth every morning., Disp: , Rfl:    amiodarone  (PACERONE ) 200 MG tablet, Take 2 tablets (400 mg total) by mouth 2 (two) times daily for 14 days, THEN 1 tablet (200 mg total) 2 (two) times daily for 14 days, THEN 1 tablet (200 mg total) 2 (two) times daily., Disp: 144 tablet, Rfl: 0   aspirin  EC 81 MG tablet, Take 81 mg by mouth every morning., Disp: , Rfl:    azelastine  (ASTELIN ) 0.1 % nasal spray, Place 2 sprays into both nostrils 2 (two) times daily. Use in each nostril as directed, Disp: 30 mL, Rfl: 12   BD PEN NEEDLE NANO 2ND GEN 32G X 4 MM MISC, USE  AS DIRECTED AT BEDTIME, Disp: 200 each, Rfl: 0   blood glucose meter kit and supplies KIT, Dispense based on patient and  insurance preference. Use up to four times daily as directed., Disp: 1 each, Rfl: 11   buPROPion  (WELLBUTRIN  XL) 150 MG 24 hr tablet, Take 150 mg by mouth daily., Disp: , Rfl:    butalbital -acetaminophen -caffeine  (FIORICET ) 50-325-40 MG tablet, Take 1 tablet by mouth every 4 (four) hours as needed for migraine., Disp: , Rfl:    carvedilol  (COREG ) 3.125 MG tablet, Take 1 tablet (3.125 mg total) by mouth 2 (two) times daily with a meal. (Patient taking differently: Take 6.25 mg by mouth 2 (two) times daily with a meal.), Disp: 60 tablet, Rfl: 2   cetirizine  (  ZYRTEC ) 10 MG tablet, Take 1 tablet (10 mg total) by mouth every morning., Disp: , Rfl:    colchicine  0.6 MG tablet, Take 1 tablet (0.6 mg total) by mouth daily as needed (gout flare)., Disp: , Rfl:    Continuous Glucose Sensor (FREESTYLE LIBRE 3 PLUS SENSOR) MISC, Place 1 sensor on the skin every 15 days. Use to check glucose continuously, Disp: 2 each, Rfl: 12   Continuous Glucose Sensor (FREESTYLE LIBRE 3 SENSOR) MISC, Place 1 sensor on the skin every 14 days. Use to check glucose continuously, Disp: 2 each, Rfl: 11   dapagliflozin  propanediol (FARXIGA ) 10 MG TABS tablet, Take 1 tablet (10 mg total) by mouth daily., Disp: 90 tablet, Rfl: 3   fenofibrate  (TRICOR ) 145 MG tablet, Take 1 tablet (145 mg total) by mouth every evening., Disp: , Rfl:    fluticasone  (FLONASE ) 50 MCG/ACT nasal spray, Place 2 sprays into both nostrils daily as needed for allergies or rhinitis. Home med., Disp: , Rfl:    Galcanezumab-gnlm 120 MG/ML SOAJ, Inject 120 mg into the skin. Monthly, Disp: , Rfl:    Insulin  Glargine (BASAGLAR  KWIKPEN) 100 UNIT/ML, Inject 36 Units into the skin at bedtime., Disp: 45 mL, Rfl: 3   insulin  lispro (HUMALOG ) 100 UNIT/ML injection, Inject 10 Units into the skin 3 (three) times daily before meals., Disp: , Rfl:    Insulin  Pen Needle 32G X 4 MM MISC, 1 each by Does not apply route in the morning, at noon, in the evening, and at bedtime., Disp:  120 each, Rfl: 11   ipratropium-albuterol  (DUONEB) 0.5-2.5 (3) MG/3ML SOLN, Take 3 mLs by nebulization every 6 (six) hours as needed., Disp: 360 mL, Rfl: 11   meclizine  (ANTIVERT ) 25 MG tablet, Take 0.5 tablets (12.5 mg total) by mouth 3 (three) times daily as needed for dizziness., Disp: , Rfl:    nitroGLYCERIN  (NITROSTAT ) 0.4 MG SL tablet, Place 1 tablet (0.4 mg total) under the tongue every 5 (five) minutes x 3 doses as needed for chest pain., Disp: 30 tablet, Rfl: 0   omeprazole  (PRILOSEC) 40 MG capsule, Take 1 capsule (40 mg total) by mouth every morning., Disp: , Rfl:    oxyCODONE  (OXY IR/ROXICODONE ) 5 MG immediate release tablet, Take 5 mg by mouth every 6 (six) hours as needed for severe pain (pain score 7-10)., Disp: , Rfl:    potassium chloride  SA (KLOR-CON  M) 20 MEQ tablet, Take 1 tablet (20 mEq total) by mouth every other day. AM, Disp: , Rfl:    Resmetirom  (REZDIFFRA ) 100 MG TABS, TAKE 1 TABLET BY MOUTH DAILY WITH OR WITHOUT FOOD, Disp: 30 tablet, Rfl: 1   rivaroxaban  (XARELTO ) 20 MG TABS tablet, Take 1 tablet (20 mg total) by mouth daily with supper., Disp: 90 tablet, Rfl: 3   rosuvastatin  (CRESTOR ) 20 MG tablet, Take 1 tablet (20 mg total) by mouth at bedtime., Disp: , Rfl:    sacubitril -valsartan  (ENTRESTO ) 24-26 MG, Take 1 tablet by mouth 2 (two) times daily., Disp: 60 tablet, Rfl: 2   Semaglutide , 2 MG/DOSE, (OZEMPIC , 2 MG/DOSE,) 8 MG/3ML SOPN, Inject 2 mg into the skin once a week. Tuesdays, Disp: 3 mL, Rfl: 2   spironolactone  (ALDACTONE ) 25 MG tablet, Take 1 tablet by mouth once daily, Disp: 90 tablet, Rfl: 1   TALTZ 80 MG/ML SOAJ, Inject 3 mLs into the skin every 28 (twenty-eight) days., Disp: , Rfl:    torsemide  (DEMADEX ) 20 MG tablet, Take 1 tablet (20 mg total) by mouth every morning., Disp: ,  Rfl:    umeclidinium-vilanterol (ANORO ELLIPTA ) 62.5-25 MCG/ACT AEPB, Inhale 1 puff into the lungs every morning., Disp: , Rfl:    Family History  Problem Relation Age of Onset    Diabetes Mother    Diabetes Mellitus II Mother    Hypothyroidism Mother    Hypertension Mother    Kidney failure Mother        Dialysis   Heart attack Mother        10 yo approximately   Hypertension Father    Gout Father    Cancer Maternal Grandfather    Diabetes Maternal Grandfather    Cancer Paternal Aunt      Social History   Tobacco Use   Smoking status: Former    Current packs/day: 0.00    Average packs/day: 0.5 packs/day for 33.0 years (16.5 ttl pk-yrs)    Types: Cigarettes    Start date: 05/09/1987    Quit date: 05/08/2020    Years since quitting: 3.6   Smokeless tobacco: Former    Quit date: 05/08/2020   Tobacco comments:    Quit Sept 2021  Vaping Use   Vaping status: Former  Substance Use Topics   Alcohol use: Not Currently   Drug use: No    Allergies as of 12/31/2023 - Review Complete 12/31/2023  Allergen Reaction Noted   Metformin  and related Nausea And Vomiting 11/22/2019   Zantac  [ranitidine  hcl] Diarrhea and Nausea Only 08/27/2018    Review of Systems:    All systems reviewed and negative except where noted in HPI.   Physical Exam:  BP 128/83 (BP Location: Left Arm, Patient Position: Sitting, Cuff Size: Normal)   Pulse 90   Temp 98.2 F (36.8 C) (Oral)   Wt 238 lb (108 kg)   BMI 33.19 kg/m  No LMP for male patient.  General:   Alert,  Well-developed, well-nourished, pleasant and cooperative in NAD Head:  Normocephalic and atraumatic. Eyes:  Sclera clear, no icterus.   Conjunctiva pink. Ears:  Normal auditory acuity. Nose:  No deformity, discharge, or lesions. Mouth:  No deformity or lesions,oropharynx pink & moist. Neck:  Supple; no masses or thyromegaly. Lungs:  Respirations even and unlabored.  Clear throughout to auscultation.   No wheezes, crackles, or rhonchi. No acute distress. Heart:  Regular rate and rhythm; no murmurs, clicks, rubs, or gallops. Abdomen:  Normal bowel sounds. Soft, non-tender and non-distended without masses,  hepatosplenomegaly or hernias noted.  No guarding or rebound tenderness.   Rectal: Not performed Msk:  Symmetrical without gross deformities. Good, equal movement & strength bilaterally. Pulses:  Normal pulses noted. Extremities:  No clubbing or edema.  No cyanosis. Neurologic:  Alert and oriented x3;  grossly normal neurologically. Skin:  Intact without significant lesions or rashes. No jaundice. Psych:  Alert and cooperative. Normal mood and affect.  Imaging Studies: Reviewed  Assessment and Plan:   Gabriel [name]. is a 53 y.o. Caucasian male with metabolic syndrome, poorly controlled diabetes, CHF, nonischemic cardiomyopathy, nonobstructive coronary artery disease AICD, history of PE, A-fib on Xarelto , CKD is seen in consultation for MASH fibrosis of liver, gastroparesis, chronic diarrhea  Chronic nonbloody diarrhea without any signs and symptoms of malabsorption Pancreatic fecal elastase levels are normal GI profile PCR, C. difficile toxin A and B were normal Discussed in length regarding healthy eating habits, dietary changes and moderate physical activity.  Advised him to decrease the amount of ranch dressing, significantly cut back on consumption of red meat, include more vegetables, lean  protein and plant-based protein Check food allergy profile, alpha gal panel If his symptoms are persistent despite lifestyle modification, discussed about colonoscopy with biopsies to rule out microscopic colitis  MASH fibrosis of liver Viral hepatitis panel negative Right upper quadrant ultrasound in 04/2023 revealed steatosis with no presence of nodularity Right upper quadrant ultrasound in 11/2021 revealed nodularity of the liver with severe hepatic steatosis.  No evidence of portal hypertension EGD in 09/2022 with no evidence of varices Maintain strict low-sodium diet Delight Felts revealed F3 fibrosis in 03/2023, therefore resmetirom  100 mg was initiated  Repeat Gabriel [name] fibrosis panel Tight  control of diabetes Continue to remain abstinent from alcohol use  Gastroparesis based on gastric emptying study EGD revealed gastric erosions and superficial gastric ulcers, no evidence of H. pylori Continue omeprazole  40 mg once daily long-term given history of peptic ulcer disease and on Xarelto  Repeat EGD confirmed healing of gastric ulcers Patient had right upper quadrant ultrasound in 4/23 which was normal, HIDA scan was negative CT abdomen pelvis with contrast in 03/2021 revealed normal-appearing pancreas Refill Reglan  5 mg before each meal as needed only Tight control of diabetes   Follow up in 6 months   Karma Oz, MD

## 2024-01-01 ENCOUNTER — Ambulatory Visit: Admitting: Physician Assistant

## 2024-01-01 ENCOUNTER — Encounter: Payer: Self-pay | Admitting: Physician Assistant

## 2024-01-01 VITALS — BP 135/82 | HR 80 | Resp 16 | Ht 71.0 in | Wt 238.0 lb

## 2024-01-01 DIAGNOSIS — N1832 Chronic kidney disease, stage 3b: Secondary | ICD-10-CM

## 2024-01-01 DIAGNOSIS — K529 Noninfective gastroenteritis and colitis, unspecified: Secondary | ICD-10-CM | POA: Diagnosis not present

## 2024-01-01 DIAGNOSIS — N1831 Chronic kidney disease, stage 3a: Secondary | ICD-10-CM

## 2024-01-01 DIAGNOSIS — E0822 Diabetes mellitus due to underlying condition with diabetic chronic kidney disease: Secondary | ICD-10-CM

## 2024-01-01 DIAGNOSIS — I4891 Unspecified atrial fibrillation: Secondary | ICD-10-CM

## 2024-01-01 DIAGNOSIS — J449 Chronic obstructive pulmonary disease, unspecified: Secondary | ICD-10-CM

## 2024-01-01 DIAGNOSIS — K7581 Nonalcoholic steatohepatitis (NASH): Secondary | ICD-10-CM | POA: Diagnosis not present

## 2024-01-01 DIAGNOSIS — K7460 Unspecified cirrhosis of liver: Secondary | ICD-10-CM | POA: Diagnosis not present

## 2024-01-01 DIAGNOSIS — F339 Major depressive disorder, recurrent, unspecified: Secondary | ICD-10-CM

## 2024-01-01 DIAGNOSIS — G4733 Obstructive sleep apnea (adult) (pediatric): Secondary | ICD-10-CM | POA: Diagnosis not present

## 2024-01-01 DIAGNOSIS — I25118 Atherosclerotic heart disease of native coronary artery with other forms of angina pectoris: Secondary | ICD-10-CM

## 2024-01-01 DIAGNOSIS — I5022 Chronic systolic (congestive) heart failure: Secondary | ICD-10-CM

## 2024-01-01 DIAGNOSIS — Z794 Long term (current) use of insulin: Secondary | ICD-10-CM | POA: Diagnosis not present

## 2024-01-01 NOTE — Progress Notes (Signed)
 Established patient visit  Patient: Gabriel [name].   DOB: 1970-10-20   53 y.o. Male  MRN: [id number] Visit Date: 01/01/2024  Today's healthcare provider: Blane Bunting, PA-C   Chief Complaint  Patient presents with   Follow-up    3 month f/u  no other concerns   Subjective       Discussed the use of AI scribe software for clinical note transcription with the patient, who gave verbal consent to proceed.  History of Present Illness   Diabetes Mellitus Type II, Follow-up  Lab Results  Component Value Date   HGBA1C 6.9 (H) 01/01/2024   HGBA1C 6.6 (A) 07/11/2023   HGBA1C 6.4 (A) 04/16/2023   Wt Readings from Last 3 Encounters:  01/01/24 238 lb (108 kg)  12/31/23 238 lb (108 kg)  12/24/23 235 lb (106.6 kg)   Last seen for diabetes 6 weeks ago.  Management since then includes Ozempic , insulin  36 units and Farxiga . He reports good compliance with treatment. He is not having side effects.  Symptoms: No fatigue No foot ulcerations  No appetite changes No nausea   No polydipsia  No polyuria No visual disturbances   No vomiting     Home blood sugar records: 136 nonfasting  Episodes of hypoglycemia? No    Current insulin  regiment: Insulin  glargine 36 units daily    Pertinent Labs: Lab Results  Component Value Date   CHOL 180 01/01/2024   HDL 29 (L) 01/01/2024   LDLCALC 94 01/01/2024   LDLDIRECT 60.6 11/23/2020   TRIG 341 (H) 01/01/2024   CHOLHDL 6.2 (H) 01/01/2024   Lab Results  Component Value Date   NA 139 01/01/2024   K 4.9 01/01/2024   CREATININE 1.50 (H) 01/01/2024   EGFR 55 (L) 01/01/2024   MICRALBCREAT 25 04/16/2023     ---------------------------------------------------------------------------------------------------      01/01/2024    8:42 AM 11/20/2023   10:46 AM 04/16/2023   11:04 AM  Depression screen PHQ 2/9  Decreased Interest 0 0 3  Down, Depressed, Hopeless 0 0 3  PHQ - 2 Score 0 0 6  Altered sleeping 2 0 1  Tired, decreased  energy 3 0 3  Change in appetite 1 3 2   Feeling bad or failure about yourself  0 0 3  Trouble concentrating 0 0 2  Moving slowly or fidgety/restless 0 0 2  Suicidal thoughts 0 0 1  PHQ-9 Score 6 3 20   Difficult doing work/chores Not difficult at all Not difficult at all Somewhat difficult      01/01/2024    8:43 AM 11/20/2023   10:46 AM 04/16/2023   11:06 AM 06/07/2022    8:45 AM  GAD 7 : Generalized Anxiety Score  Nervous, Anxious, on Edge 0 0 2 0  Control/stop worrying 0 0 2 0  Worry too much - different things 0 0 3 0  Trouble relaxing 1 0 3 0  Restless 0 0 1 0  Easily annoyed or irritable 0 0 3 0  Afraid - awful might happen 0 0 3 0  Total GAD 7 Score 1 0 17 0  Anxiety Difficulty Not difficult at all Not difficult at all Somewhat difficult Not difficult at all    Medications: Outpatient Medications Prior to Visit  Medication Sig   Accu-Chek Softclix Lancets lancets USE UP TO FOUR TIMES DAILY   albuterol  (VENTOLIN  HFA) 108 (90 Base) MCG/ACT inhaler Inhale 2 puffs into the lungs every 4 (four) hours as needed for wheezing or  shortness of breath.   allopurinol  (ZYLOPRIM ) 300 MG tablet Take 300 mg by mouth every morning.   amiodarone  (PACERONE ) 200 MG tablet Take 2 tablets (400 mg total) by mouth 2 (two) times daily for 14 days, THEN 1 tablet (200 mg total) 2 (two) times daily for 14 days, THEN 1 tablet (200 mg total) 2 (two) times daily.   aspirin  EC 81 MG tablet Take 81 mg by mouth every morning.   azelastine  (ASTELIN ) 0.1 % nasal spray Place 2 sprays into both nostrils 2 (two) times daily. Use in each nostril as directed   BD PEN NEEDLE NANO 2ND GEN 32G X 4 MM MISC USE  AS DIRECTED AT BEDTIME   blood glucose meter kit and supplies KIT Dispense based on patient and insurance preference. Use up to four times daily as directed.   buPROPion  (WELLBUTRIN  XL) 150 MG 24 hr tablet Take 150 mg by mouth daily.   butalbital -acetaminophen -caffeine  (FIORICET ) 50-325-40 MG tablet Take 1 tablet  by mouth every 4 (four) hours as needed for migraine.   carvedilol  (COREG ) 3.125 MG tablet Take 1 tablet (3.125 mg total) by mouth 2 (two) times daily with a meal. (Patient taking differently: Take 6.25 mg by mouth 2 (two) times daily with a meal.)   cetirizine  (ZYRTEC ) 10 MG tablet Take 1 tablet (10 mg total) by mouth every morning.   colchicine  0.6 MG tablet Take 1 tablet (0.6 mg total) by mouth daily as needed (gout flare).   Continuous Glucose Sensor (FREESTYLE LIBRE 3 PLUS SENSOR) MISC Place 1 sensor on the skin every 15 days. Use to check glucose continuously   Continuous Glucose Sensor (FREESTYLE LIBRE 3 SENSOR) MISC Place 1 sensor on the skin every 14 days. Use to check glucose continuously   dapagliflozin  propanediol (FARXIGA ) 10 MG TABS tablet Take 1 tablet (10 mg total) by mouth daily.   fenofibrate  (TRICOR ) 145 MG tablet Take 1 tablet (145 mg total) by mouth every evening.   fluticasone  (FLONASE ) 50 MCG/ACT nasal spray Place 2 sprays into both nostrils daily as needed for allergies or rhinitis. Home med.   Galcanezumab-gnlm 120 MG/ML SOAJ Inject 120 mg into the skin. Monthly   Insulin  Glargine (BASAGLAR  KWIKPEN) 100 UNIT/ML Inject 36 Units into the skin at bedtime.   insulin  lispro (HUMALOG ) 100 UNIT/ML injection Inject 10 Units into the skin 3 (three) times daily before meals.   Insulin  Pen Needle 32G X 4 MM MISC 1 each by Does not apply route in the morning, at noon, in the evening, and at bedtime.   ipratropium-albuterol  (DUONEB) 0.5-2.5 (3) MG/3ML SOLN Take 3 mLs by nebulization every 6 (six) hours as needed.   meclizine  (ANTIVERT ) 25 MG tablet Take 0.5 tablets (12.5 mg total) by mouth 3 (three) times daily as needed for dizziness.   nitroGLYCERIN  (NITROSTAT ) 0.4 MG SL tablet Place 1 tablet (0.4 mg total) under the tongue every 5 (five) minutes x 3 doses as needed for chest pain.   omeprazole  (PRILOSEC) 40 MG capsule Take 1 capsule (40 mg total) by mouth every morning.   oxyCODONE   (OXY IR/ROXICODONE ) 5 MG immediate release tablet Take 5 mg by mouth every 6 (six) hours as needed for severe pain (pain score 7-10).   potassium chloride  SA (KLOR-CON  M) 20 MEQ tablet Take 1 tablet (20 mEq total) by mouth every other day. AM   Resmetirom  (REZDIFFRA ) 100 MG TABS TAKE 1 TABLET BY MOUTH DAILY WITH OR WITHOUT FOOD   rivaroxaban  (XARELTO ) 20 MG TABS tablet  Take 1 tablet (20 mg total) by mouth daily with supper.   rosuvastatin  (CRESTOR ) 20 MG tablet Take 1 tablet (20 mg total) by mouth at bedtime.   sacubitril -valsartan  (ENTRESTO ) 24-26 MG Take 1 tablet by mouth 2 (two) times daily.   Semaglutide , 2 MG/DOSE, (OZEMPIC , 2 MG/DOSE,) 8 MG/3ML SOPN Inject 2 mg into the skin once a week. Tuesdays   spironolactone  (ALDACTONE ) 25 MG tablet Take 1 tablet by mouth once daily   TALTZ 80 MG/ML SOAJ Inject 3 mLs into the skin every 28 (twenty-eight) days.   torsemide  (DEMADEX ) 20 MG tablet Take 1 tablet (20 mg total) by mouth every morning.   umeclidinium-vilanterol (ANORO ELLIPTA ) 62.5-25 MCG/ACT AEPB Inhale 1 puff into the lungs every morning.   No facility-administered medications prior to visit.    Review of Systems All negative Except see HPI        Objective    BP 135/82 (BP Location: Right Arm, Patient Position: Sitting, Cuff Size: Normal)   Pulse 80   Resp 16   Ht 5\' 11"  (1.803 m)   Wt 238 lb (108 kg)   SpO2 96%   BMI 33.19 kg/m      Physical Exam Vitals reviewed.  Constitutional:      General: He is not in acute distress.    Appearance: Normal appearance. He is not diaphoretic.  HENT:     Head: Normocephalic and atraumatic.  Eyes:     General: No scleral icterus.    Conjunctiva/sclera: Conjunctivae normal.  Cardiovascular:     Rate and Rhythm: Normal rate and regular rhythm.     Pulses: Normal pulses.     Heart sounds: Normal heart sounds. No murmur heard. Pulmonary:     Effort: Pulmonary effort is normal. No respiratory distress.     Breath sounds: Normal  breath sounds. No wheezing or rhonchi.  Musculoskeletal:     Cervical back: Neck supple.     Right lower leg: No edema.     Left lower leg: No edema.  Lymphadenopathy:     Cervical: No cervical adenopathy.  Skin:    General: Skin is warm and dry.     Findings: No rash.  Neurological:     Mental Status: He is alert and oriented to person, place, and time. Mental status is at baseline.  Psychiatric:        Mood and Affect: Mood normal.        Behavior: Behavior normal.      No results found for any visits on 01/01/24.      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, with long-term current use of insulin  (HCC) (Primary) Chronic, previously stable Continue Ozempic  mg daily, insulin  36 units and Farxiga  Continue to use [employer] with nephrology Continue with low-carb diet and regular exercise as tolerated - Ambulatory referral to Endocrinology - Amb Referral to Nutrition and Diabetic Education - CBC with Differential/Platelet - Comprehensive metabolic panel with GFR - Hemoglobin A1c - Lipid panel Will reassess after receiving lab results  Chronic obstructive pulmonary disease, unspecified COPD type (HCC) Chronic and stable Continue current medical management Will follow-up  OSA on CPAP Chronic and stable Continue to use CPAP nightly Follow-up  Atherosclerotic heart disease of native coronary artery with other forms of angina pectoris (HCC) Chronic systolic CHF (congestive heart failure) (HCC) Atrial fibrillation with RVR (HCC) Chronic and stable Continue taking Xarelto  and amiodarone  400 twice daily, Entresto , torsemide  20, Aldactone ,  carvedilol  3.125 twice daily, Farxiga  10 Continue follow-up with specialist Repeat CBC, CMP, A1c, lipid Will follow-up  Depression, recurrent (HCC)    01/01/2024    8:42 AM 11/20/2023   10:46 AM 04/16/2023   11:04 AM  PHQ9 SCORE ONLY  PHQ-9 Total Score 6 3 20   Chronic  and stable Not currently on medication We will follow-up  Stage 3a chronic kidney disease (HCC) Chronic with previous GFR of 52 We will repeat Cmp Advised to avoid NSAID, encourage hydration Will follow-up   Orders Placed This Encounter  Procedures   Ambulatory referral to Endocrinology    Referral Priority:   Routine    Referral Type:   Consultation    Referral Reason:   Specialty Services Required    Number of Visits Requested:   1   Amb Referral to Nutrition and Diabetic Education    Referral Priority:   Routine    Referral Type:   Consultation    Referral Reason:   Specialty Services Required    Number of Visits Requested:   1    No follow-ups on file.   The patient was advised to call back or seek an in-person evaluation if the symptoms worsen or if the condition fails to improve as anticipated.  I discussed the assessment and treatment plan with the patient. The patient was provided an opportunity to ask questions and all were answered. The patient agreed with the plan and demonstrated an understanding of the instructions.  I, Shakeerah Gradel, PA-C have reviewed all documentation for this visit. The documentation on 01/01/2024  for the exam, diagnosis, procedures, and orders are all accurate and complete.  Blane Bunting, [facility], MMS [facility] [phone] (phone) [phone] (fax)  [facility]

## 2024-01-02 ENCOUNTER — Ambulatory Visit: Attending: Cardiology

## 2024-01-02 DIAGNOSIS — I4819 Other persistent atrial fibrillation: Secondary | ICD-10-CM | POA: Diagnosis not present

## 2024-01-02 DIAGNOSIS — I5022 Chronic systolic (congestive) heart failure: Secondary | ICD-10-CM | POA: Insufficient documentation

## 2024-01-02 DIAGNOSIS — Z79899 Other long term (current) drug therapy: Secondary | ICD-10-CM | POA: Insufficient documentation

## 2024-01-02 LAB — CBC WITH DIFFERENTIAL/PLATELET
Basophils Absolute: 0.1 10*3/uL (ref 0.0–0.2)
Basos: 2 %
EOS (ABSOLUTE): 0.2 10*3/uL (ref 0.0–0.4)
Eos: 3 %
Hematocrit: 58.4 % — ABNORMAL HIGH (ref 37.5–51.0)
Hemoglobin: 18.3 g/dL — ABNORMAL HIGH (ref 13.0–17.7)
Immature Grans (Abs): 0.1 10*3/uL (ref 0.0–0.1)
Immature Granulocytes: 1 %
Lymphocytes Absolute: 2.2 10*3/uL (ref 0.7–3.1)
Lymphs: 29 %
MCH: 26.4 pg — ABNORMAL LOW (ref 26.6–33.0)
MCHC: 31.3 g/dL — ABNORMAL LOW (ref 31.5–35.7)
MCV: 84 fL (ref 79–97)
Monocytes Absolute: 0.5 10*3/uL (ref 0.1–0.9)
Monocytes: 7 %
Neutrophils Absolute: 4.5 10*3/uL (ref 1.4–7.0)
Neutrophils: 58 %
Platelets: 142 10*3/uL — ABNORMAL LOW (ref 150–450)
RBC: 6.94 x10E6/uL — ABNORMAL HIGH (ref 4.14–5.80)
RDW: 19.5 % — ABNORMAL HIGH (ref 11.6–15.4)
WBC: 7.5 10*3/uL (ref 3.4–10.8)

## 2024-01-02 LAB — ECHOCARDIOGRAM COMPLETE
AR max vel: 3.84 cm2
AV Area VTI: 4.22 cm2
AV Area mean vel: 3.71 cm2
AV Mean grad: 2 mmHg
AV Peak grad: 4.2 mmHg
Ao pk vel: 1.02 m/s
Area-P 1/2: 3.79 cm2
Calc EF: 35 %
P 1/2 time: 397 ms
S' Lateral: 5.1 cm
Single Plane A2C EF: 32.7 %
Single Plane A4C EF: 37 %

## 2024-01-02 LAB — COMPREHENSIVE METABOLIC PANEL WITH GFR
ALT: 28 IU/L (ref 0–44)
AST: 19 IU/L (ref 0–40)
Albumin: 4.6 g/dL (ref 3.8–4.9)
Alkaline Phosphatase: 88 IU/L (ref 44–121)
BUN/Creatinine Ratio: 19 (ref 9–20)
BUN: 29 mg/dL — ABNORMAL HIGH (ref 6–24)
Bilirubin Total: 1.1 mg/dL (ref 0.0–1.2)
CO2: 22 mmol/L (ref 20–29)
Calcium: 10.4 mg/dL — ABNORMAL HIGH (ref 8.7–10.2)
Chloride: 99 mmol/L (ref 96–106)
Creatinine, Ser: 1.5 mg/dL — ABNORMAL HIGH (ref 0.76–1.27)
Globulin, Total: 3.4 g/dL (ref 1.5–4.5)
Glucose: 178 mg/dL — ABNORMAL HIGH (ref 70–99)
Potassium: 4.9 mmol/L (ref 3.5–5.2)
Sodium: 139 mmol/L (ref 134–144)
Total Protein: 8 g/dL (ref 6.0–8.5)
eGFR: 55 mL/min/{1.73_m2} — ABNORMAL LOW (ref >59)

## 2024-01-02 LAB — LIPID PANEL
Chol/HDL Ratio: 6.2 ratio — ABNORMAL HIGH (ref 0.0–5.0)
Cholesterol, Total: 180 mg/dL (ref 100–199)
HDL: 29 mg/dL — ABNORMAL LOW (ref >39)
LDL Chol Calc (NIH): 94 mg/dL (ref 0–99)
Triglycerides: 341 mg/dL — ABNORMAL HIGH (ref 0–149)
VLDL Cholesterol Cal: 57 mg/dL — ABNORMAL HIGH (ref 5–40)

## 2024-01-02 LAB — HEMOGLOBIN A1C
Est. average glucose Bld gHb Est-mCnc: 151 mg/dL
Hgb A1c MFr Bld: 6.9 % — ABNORMAL HIGH (ref 4.8–5.6)

## 2024-01-02 MED ORDER — PERFLUTREN LIPID MICROSPHERE: 3.0000 mL | INTRAVENOUS | Status: AC | PRN

## 2024-01-03 LAB — NASH FIBROSURE(R) PLUS
ALPHA 2-MACROGLOBULINS, QN: 229 mg/dL (ref 110–276)
ALT (SGPT) P5P: 29 IU/L (ref 0–55)
AST (SGOT) P5P: 24 IU/L (ref 0–40)
Apolipoprotein A-1: 110 mg/dL (ref 101–178)
Bilirubin, Total: 0.8 mg/dL (ref 0.0–1.2)
Cholesterol, Total: 179 mg/dL (ref 100–199)
Fibrosis Score: 0.49 — ABNORMAL HIGH (ref 0.00–0.21)
GGT: 32 IU/L (ref 0–65)
Glucose: 188 mg/dL — ABNORMAL HIGH (ref 70–99)
Haptoglobin: 129 mg/dL (ref 29–370)
NASH Score: 0.63 — ABNORMAL HIGH (ref 0.00–0.25)
Steatosis Score: 0.8 — ABNORMAL HIGH (ref 0.00–0.40)
Triglycerides: 344 mg/dL — ABNORMAL HIGH (ref 0–149)

## 2024-01-03 LAB — ALPHA-GAL PANEL
Allergen Lamb IgE: <0.1 kU/L
Beef IgE: <0.1 kU/L
IgE (Immunoglobulin E), Serum: 35 [IU]/mL (ref 6–495)
O215-IgE Alpha-Gal: <0.1 kU/L
Pork IgE: <0.1 kU/L

## 2024-01-03 LAB — FOOD ALLERGY PROFILE
Allergen Corn, IgE: <0.1 kU/L
Clam IgE: <0.1 kU/L
Codfish IgE: <0.1 kU/L
Egg White IgE: <0.1 kU/L
Milk IgE: <0.1 kU/L
Peanut IgE: <0.1 kU/L
Scallop IgE: <0.1 kU/L
Sesame Seed IgE: <0.1 kU/L
Shrimp IgE: <0.1 kU/L
Soybean IgE: <0.1 kU/L
Walnut IgE: <0.1 kU/L
Wheat IgE: <0.1 kU/L

## 2024-01-04 ENCOUNTER — Ambulatory Visit: Payer: Self-pay | Admitting: Cardiology

## 2024-01-05 ENCOUNTER — Ambulatory Visit: Payer: Self-pay | Admitting: Physician Assistant

## 2024-01-05 ENCOUNTER — Ambulatory Visit: Payer: Self-pay | Admitting: Gastroenterology

## 2024-01-07 ENCOUNTER — Other Ambulatory Visit: Payer: Self-pay | Admitting: Pharmacist

## 2024-01-07 ENCOUNTER — Telehealth: Payer: Self-pay | Admitting: Pharmacist

## 2024-01-07 NOTE — Progress Notes (Signed)
   Outreach Note  01/07/2024 Name: Gabriel [name]. MRN: [id number] DOB: 05-Feb-1971  Referred by: Tasia Farr, FNP  Was unable to reach patient via telephone today and have left HIPAA compliant voicemail asking patient to return my call.   Follow Up Plan: Will collaborate with Care Guide to outreach to schedule follow up with me  Arthur Lash, PharmD, [facility] [phone]

## 2024-01-09 ENCOUNTER — Telehealth: Payer: Self-pay | Admitting: Family

## 2024-01-13 ENCOUNTER — Telehealth: Payer: Self-pay | Admitting: Cardiovascular Disease

## 2024-01-13 ENCOUNTER — Other Ambulatory Visit: Payer: Self-pay

## 2024-01-13 MED ORDER — CARVEDILOL 6.25 MG PO TABS: 6.2500 mg | ORAL_TABLET | Freq: Two times a day (BID) | ORAL | 11 refills | Status: DC

## 2024-01-13 NOTE — Telephone Encounter (Signed)
 Pt came by the office and was wanting to know if he needed to continue his medication for A-fib, but is unsure of the medication name. Pt stated that there is no refill on that medication. Please advise.

## 2024-01-13 NOTE — Telephone Encounter (Signed)
 Spoke with patient. Patient states that he is currently taking Amiodarone  200 MG daily and that he is out of refills. Patient requesting prescription be sent in by Dr. Marven Slimmer. Previously ordered by PCP.

## 2024-01-14 DIAGNOSIS — R42 Dizziness and giddiness: Secondary | ICD-10-CM | POA: Diagnosis not present

## 2024-01-14 DIAGNOSIS — R519 Headache, unspecified: Secondary | ICD-10-CM | POA: Diagnosis not present

## 2024-01-14 DIAGNOSIS — Z1331 Encounter for screening for depression: Secondary | ICD-10-CM | POA: Diagnosis not present

## 2024-01-14 MED ORDER — AMIODARONE HCL 200 MG PO TABS: 200.0000 mg | ORAL_TABLET | Freq: Every day | ORAL | 3 refills | Status: DC

## 2024-01-14 MED ORDER — AMIODARONE HCL 200 MG PO TABS: 200.0000 mg | ORAL_TABLET | Freq: Every day | ORAL | 3 refills | Status: AC

## 2024-01-14 NOTE — Telephone Encounter (Signed)
Refill has been sent in.  

## 2024-01-14 NOTE — Addendum Note (Signed)
 Addended by: CHAUVIGNE, Arlander Gillen on: 01/14/2024 07:25 AM   Modules accepted: Orders

## 2024-01-19 ENCOUNTER — Encounter: Admitting: Internal Medicine

## 2024-01-21 ENCOUNTER — Encounter: Payer: Self-pay | Admitting: Internal Medicine

## 2024-01-21 ENCOUNTER — Telehealth: Payer: Self-pay

## 2024-01-21 ENCOUNTER — Ambulatory Visit: Attending: Internal Medicine | Admitting: Internal Medicine

## 2024-01-21 VITALS — BP 124/80 | HR 89 | Wt 238.2 lb

## 2024-01-21 DIAGNOSIS — K7581 Nonalcoholic steatohepatitis (NASH): Secondary | ICD-10-CM | POA: Insufficient documentation

## 2024-01-21 DIAGNOSIS — N189 Chronic kidney disease, unspecified: Secondary | ICD-10-CM | POA: Diagnosis not present

## 2024-01-21 DIAGNOSIS — I472 Ventricular tachycardia, unspecified: Secondary | ICD-10-CM | POA: Diagnosis not present

## 2024-01-21 DIAGNOSIS — I428 Other cardiomyopathies: Secondary | ICD-10-CM | POA: Diagnosis not present

## 2024-01-21 DIAGNOSIS — Z7901 Long term (current) use of anticoagulants: Secondary | ICD-10-CM | POA: Diagnosis not present

## 2024-01-21 DIAGNOSIS — I130 Hypertensive heart and chronic kidney disease with heart failure and stage 1 through stage 4 chronic kidney disease, or unspecified chronic kidney disease: Secondary | ICD-10-CM | POA: Insufficient documentation

## 2024-01-21 DIAGNOSIS — Z794 Long term (current) use of insulin: Secondary | ICD-10-CM | POA: Insufficient documentation

## 2024-01-21 DIAGNOSIS — G8929 Other chronic pain: Secondary | ICD-10-CM | POA: Diagnosis not present

## 2024-01-21 DIAGNOSIS — R7989 Other specified abnormal findings of blood chemistry: Secondary | ICD-10-CM | POA: Diagnosis not present

## 2024-01-21 DIAGNOSIS — Z7984 Long term (current) use of oral hypoglycemic drugs: Secondary | ICD-10-CM | POA: Insufficient documentation

## 2024-01-21 DIAGNOSIS — I480 Paroxysmal atrial fibrillation: Secondary | ICD-10-CM | POA: Diagnosis not present

## 2024-01-21 DIAGNOSIS — K7460 Unspecified cirrhosis of liver: Secondary | ICD-10-CM | POA: Diagnosis not present

## 2024-01-21 DIAGNOSIS — G4733 Obstructive sleep apnea (adult) (pediatric): Secondary | ICD-10-CM | POA: Diagnosis not present

## 2024-01-21 DIAGNOSIS — Z7982 Long term (current) use of aspirin: Secondary | ICD-10-CM | POA: Insufficient documentation

## 2024-01-21 DIAGNOSIS — R0789 Other chest pain: Secondary | ICD-10-CM | POA: Insufficient documentation

## 2024-01-21 DIAGNOSIS — Z87891 Personal history of nicotine dependence: Secondary | ICD-10-CM | POA: Diagnosis not present

## 2024-01-21 DIAGNOSIS — Z86711 Personal history of pulmonary embolism: Secondary | ICD-10-CM | POA: Diagnosis not present

## 2024-01-21 DIAGNOSIS — J4489 Other specified chronic obstructive pulmonary disease: Secondary | ICD-10-CM | POA: Diagnosis not present

## 2024-01-21 DIAGNOSIS — I272 Pulmonary hypertension, unspecified: Secondary | ICD-10-CM | POA: Insufficient documentation

## 2024-01-21 DIAGNOSIS — Z79899 Other long term (current) drug therapy: Secondary | ICD-10-CM | POA: Insufficient documentation

## 2024-01-21 DIAGNOSIS — I10 Essential (primary) hypertension: Secondary | ICD-10-CM

## 2024-01-21 DIAGNOSIS — Z95810 Presence of automatic (implantable) cardiac defibrillator: Secondary | ICD-10-CM | POA: Insufficient documentation

## 2024-01-21 DIAGNOSIS — E1122 Type 2 diabetes mellitus with diabetic chronic kidney disease: Secondary | ICD-10-CM | POA: Insufficient documentation

## 2024-01-21 DIAGNOSIS — I2510 Atherosclerotic heart disease of native coronary artery without angina pectoris: Secondary | ICD-10-CM | POA: Diagnosis not present

## 2024-01-21 DIAGNOSIS — I5022 Chronic systolic (congestive) heart failure: Secondary | ICD-10-CM

## 2024-01-21 DIAGNOSIS — Z7985 Long-term (current) use of injectable non-insulin antidiabetic drugs: Secondary | ICD-10-CM | POA: Insufficient documentation

## 2024-01-21 NOTE — Patient Instructions (Addendum)
 Medication Changes:  No medication changes.   Lab Work:  Go DOWN to LOWER LEVEL (LL) to have your blood work completed inside of [employer] office.  We will only call you if the results are abnormal or if the provider would like to make medication changes.   Follow-Up in: 4 months with Dr. Bensimhon.  Our Doctors' schedules are NOT open yet for 4 months. We will place you on our recall list. Once they are available, we will call you to schedule your follow up appointment.   At the [facility], you and your health needs are our priority. We have a designated team specialized in the treatment of Heart Failure. This Care Team includes your primary Heart Failure Specialized Cardiologist (physician), Advanced Practice Providers (APPs- Physician Assistants and Nurse Practitioners), and Pharmacist who all work together to provide you with the care you need, when you need it.   You may see any of the following providers on your designated Care Team at your next follow up:  Dr. Jules Oar Dr. Peder Bourdon Dr. Alwin Baars Dr. Judyth Nunnery Shawnee Dellen, FNP Bevely Brush, RPH-CPP  Please be sure to bring in all your medications bottles to every appointment.   Need to Contact Us :  If you have any questions or concerns before your next appointment please send us  a message through [place] or call our office at [phone].    TO LEAVE A MESSAGE FOR THE NURSE SELECT OPTION 2, PLEASE LEAVE A MESSAGE INCLUDING: YOUR NAME DATE OF BIRTH CALL BACK NUMBER REASON FOR CALL**this is important as we prioritize the call backs  YOU WILL RECEIVE A CALL BACK THE SAME DAY AS LONG AS YOU CALL BEFORE 4:00 PM

## 2024-01-21 NOTE — Telephone Encounter (Signed)
 Dr. Bensimhon  We have received a surgical clearance request for Gabriel [name] for excision of scalp mass on 01/28/2024.. They were seen recently in clinic on 01/21/2024 for complaint of SOB and follow-up. Can you please comment on surgical clearance and guidance on holding Xarelto  prior to upcoming scalp mass excision. Please forward you guidance and recommendations to P [facility]   Thank you, Charles Connor, NP

## 2024-01-21 NOTE — Progress Notes (Signed)
 Advanced Heart Failure Clinic Note    PCP: Ostwalt, Janna, PA (last seen 04/25) Primary Cardiologist: Gollan, Timothy, MD (last seen 07/24; returns 05/25) HF cardiologist: Jules Oar, MD (last seen 09/24)  Chief Complaint: shortness of breath  HPI:  Gabriel [name] is a 53 y/o male with a history of atrial fibrillation (03/25), HTN, T2DM, chronic chest pain, chronic troponin elevation, COPD, CKD, asthma, NSVT, OSA, unprovoked PE, pulmonary HTN, psoriasis, migraines, cirrhosis from NASH, ICD placement (10/23), previous tobacco use and chronic heart failure. Had left shoulder arthroscopy with debridement, decompression, rotator cuff repair and biceps tenodesis done 09/16/23.   RHC/LHC done 06/13/20 and showed: Severe single-vessel coronary artery disease with chronic total occlusion of the proximal RCA.  The distal vessel fills via left to right collaterals. Mild to moderate, nonobstructive CAD involving the LAD and LCx with up to 50% stenosis. Mildly elevated left and right heart filling pressures. Moderate pulmonary hypertension (mean PAP 40 mmHg) with significantly elevated pulmonary vascular resistance (PVR 7.4 WU). Severely reduced Fick cardiac output/index.  Echo 05/21/21: EF of 40-45% along with mild LVH and mild Gabriel.   Admitted 09/2021 & 11/2021 with HF.   Echo 11/24/21: EF of 20-25% along with mild LVH and mild Gabriel.  Echo 02/06/22: EF of 30-35% along with mild/moderate AR.   Admitted 05/20/22 for ICD placement and discharged the next day. Was in the ED 12/21/22 due to dizziness & chest pain.   Echo 08/14/22: EF 40-45% with mild LVH, Grade I DD, severe hypokinesis of left ventricular, entire inferior and inferolateral wall.  Echo 03/08/23: EF 45-50% with moderate LVH, trivial Gabriel, mild dilatation of aortic root at 43 mm  RHC/ LHC 08/19/23:  Relatively stable appearance of the coronary arteries since last catheterization in 2021.  Chronically occluded proximal RCA now has antegrade flow,  though I suspect this represents bridging collaterals.  Left-to-right collateral remain as well as moderate LAD and LCx disease. Mildly elevated left heart filling pressures (LVEDP 20 mmHg, PCWP 18 mmHg). Moderate pulmonary hypertension (mean PA 41 mmHg, PVR 5.3 WU). Moderately reduced Fick cardiac output/index (CO 4.3 L/min, CI 1.9 L/min/m^2).  Admitted 10/22/23 due to COPD exacerbation secondary to influenza.   Admitted 11/03/23 with dizziness, tachycardia and new onset AF. Echo 3/18 -- EF 35-40%, global LV hypokinesis, severely dilated LV internal cavity size, mild LVH, small pericardial effusion, mild Gabriel. IV diuresed, GDMT restarted. Successful cardioversion 11/06/23. Amiodarone  loaded.   Here for f/u. Seen in HF Clinic 2x in April for volume overload. Increased his torsemide  back up to 40 per day. Feeling some better now. Can do ADLs without problem. SOB if does more. No edema, orthopnea or PND. Occasional CP (stable). No palpitations. Occasional orthostasis. Compliant with CPAP. No bleeding on Eliquis    Zio 5/25: occasional NSVT. No AF  ROS: All systems negative except as listed in HPI, PMH and Problem List.  SH:  Social History   Socioeconomic History   Marital status: Widowed    Spouse name: Not on file   Number of children: 1   Years of education: 46   Highest education level: 12th grade  Occupational History   Occupation: disabled  Tobacco Use   Smoking status: Former    Current packs/day: 0.00    Average packs/day: 0.5 packs/day for 33.0 years (16.5 ttl pk-yrs)    Types: Cigarettes    Start date: 05/09/1987    Quit date: 05/08/2020    Years since quitting: 3.7   Smokeless tobacco: Former  Quit date: 05/08/2020   Tobacco comments:    Quit Sept 2021  Vaping Use   Vaping status: Former  Substance and Sexual Activity   Alcohol use: Not Currently   Drug use: No   Sexual activity: Not Currently  Other Topics Concern   Not on file  Social History Narrative   Lives at  home with wife and his dad's wife.        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    Social Drivers of Corporate investment banker Strain: High Risk (01/20/2024)   Received from [facility]   Overall Financial Resource Strain (CARDIA)    Difficulty of Paying Living Expenses: Hard  Food Insecurity: Food Insecurity Present (01/20/2024)   Received from [facility]   Hunger Vital Sign    Worried About Running Out of Food in the Last Year: Sometimes true    Ran Out of Food in the Last Year: Sometimes true  Transportation Needs: No Transportation Needs (01/20/2024)   Received from [facility] - Transportation    In the past 12 months, has lack of transportation kept you from medical appointments or from getting medications?: No    Lack of Transportation (Non-Medical): No  Physical Activity: Insufficiently Active (01/20/2018)   Exercise Vital Sign    Days of Exercise per Week: 7 days    Minutes of Exercise per Session: 10 min  Stress: Stress Concern Present (09/02/2017)   [name] of Occupational Health - Occupational Stress Questionnaire    Feeling of Stress : Very much  Social Connections: Unknown (10/22/2023)   Social Connection and Isolation Panel [NHANES]    Frequency of Communication with Friends and Family: Not on file    Frequency of Social Gatherings with Friends and Family: Patient declined    Attends Religious Services: Patient declined    Active Member of Clubs or Organizations: Patient declined    Attends Banker Meetings: Patient declined    Marital Status: Patient declined  Intimate Partner Violence: Not At Risk (11/10/2023)   Humiliation, Afraid, Rape, and Kick questionnaire    Fear of Current or Ex-Partner: No    Emotionally Abused: No    Physically Abused: No    Sexually Abused: No    FH:  Family History  Problem Relation  Age of Onset   Diabetes Mother    Diabetes Mellitus II Mother    Hypothyroidism Mother    Hypertension Mother    Kidney failure Mother        Dialysis   Heart attack Mother        61 yo approximately   Hypertension Father    Gout Father    Cancer Maternal Grandfather    Diabetes Maternal Grandfather    Cancer Paternal Aunt     Past Medical History:  Diagnosis Date   Aortic root dilation (HCC) 11/28/2016   a.) TTE 11/28/2016: 43 mm; b.) TTE 11/23/2019: 38 mm; c.) TTE 08/14/2022: 40 mm; d.) TTE 03/08/2023: 43 mm   Arteriosclerosis of right carotid artery    a.) carotid doppler 04/29/2017: <50% RICA   Bronchitis 07/2023   CAD (coronary artery disease)    a.) MV 04/2015: low risk; b.) LHC 01/06/2017: minor irregs in LAD/Diag/LCX/OM, RCA 40p/m/d; c.) R/LHC 06/13/2020: 50% dLAD, 30% p-mLCx, 40% OM1, 100% pRCA (L-R collats) - med mgmt; d.) R/LHC 08/19/2023: 50% dLAD, 30% p-mLCx,  40% OM1, 100% pRCA (L-R collats), 50% p-mRCA - med mgmt   Chest wall pain, chronic    CHF (congestive heart failure) (HCC)    Chronic 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    Chronic Troponin Elevation    CKD (chronic kidney disease), stage II-III    COPD (chronic obstructive pulmonary disease) (HCC)    Depression    DM (diabetes mellitus), type 2 (HCC)    HFrEF (heart failure with reduced ejection fraction) (HCC)    a.) EF 03/20/2015: EF 45-50%; b.) MV 05/19/2015: EF 30-44%; c.) TTE 01/04/2016: EF 20-25%; d.) TTE 02/29/2016: EF 30-35%; e.) TTE 11/28/2016: EF 40-45%; f.) LHC 01/06/2017: EF 25-35%; g.) TTE 04/29/2017: EF 45-50%; h.) TTE 06/26/2019: EF 30-35%; i.) TTE 11/23/2019: EF 25-30%; j.) TTE 06/08/2020: EF 35-40%; k.) TTE 05/21/2021: EF 40-45%; l.) TTE 08/14/2022: EF 40-45%; m.) TTE 03/08/2023: EF 40-45%   History of 2019 novel coronavirus disease (COVID-19) 03/01/2022   History of methicillin resistant staphylococcus aureus (MRSA)    Hyperlipidemia    Hypertension    ICD (implantable cardioverter-defibrillator)  in place 05/20/2022   a.) LEFT midxillary [employer] Emblem MRI S-ICD (SN: [phone])   Left rotator cuff tear    Long term current use of immunosuppressive drug    a.) ixekizumab for psoriasis/psoriatic arthritis diagnosis   Long-term use of aspirin  therapy    Mediastinal lymphadenopathy    Mitral regurgitation    Mild to moderate by October 2021 echocardiogram.   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 12/2015 noted on tele-->amio;  b. 12/2015 Event monitor: no VT noted.   Obesity (BMI 30.0-34.9)    On rivaroxaban  therapy    OSA treated with BiPAP    Polyp of descending colon    Psoriasis    Psoriatic arthritis (HCC)    a.) Tx'd with ixekizumab   Pulmonary HTN (HCC) 02/29/2016   a.) TTE 02/29/2016: RVSP 47; b.) TTE 06/26/2019: RVSP 58.4; c.) TTE 12/08/2019: RVSP 40.8; d.) TTE 06/08/2020: RVSP 44.7; e.) R/LHC 06/08/2020: mRA 9, mPA 40, mPWCP 17, AO sat 98, PA sat 61, CO 3.1, CI 1.3, PVR 7.4, LVEDP 20; f.) R/LHC 08/18/2020: mRA 5, mPA 41, mPCWP 18, AO sat 92, PA sat 59, CO 4.3, CI 1.9, PVR 5.3   Recurrent pulmonary emboli (HCC) 06/07/2020   06/07/20: small bilateral PEs.  12/31/19: RUL and RLL PEs.   Stroke [facility])    pt states per mri in 2023-unsure of when stroke happened   Syncope    a. 01/2016 - felt to be vasovagal.   Thrombocytopenia (HCC)     Current Outpatient Medications  Medication Sig Dispense Refill   aspirin  EC 81 MG tablet Take 81 mg by mouth every morning.     azelastine  (ASTELIN ) 0.1 % nasal spray Place 2 sprays into both nostrils 2 (two) times daily. Use in each nostril as directed 30 mL 12   BD PEN NEEDLE NANO 2ND GEN 32G X 4 MM MISC USE  AS DIRECTED AT BEDTIME 200 each  0   blood glucose meter kit and supplies KIT Dispense based on patient and insurance preference. Use up to four times daily as directed. 1 each 11   buPROPion  (WELLBUTRIN  XL) 150 MG 24  hr tablet Take 150 mg by mouth daily.     butalbital -acetaminophen -caffeine  (FIORICET ) 50-325-40 MG tablet Take 1 tablet by mouth every 4 (four) hours as needed for migraine.     carvedilol  (COREG ) 6.25 MG tablet Take 1 tablet (6.25 mg total) by mouth 2 (two) times daily. 60 tablet 11   carvedilol  (COREG ) 6.25 MG tablet Take 6.25 mg by mouth 2 (two) times daily with a meal.     cetirizine  (ZYRTEC ) 10 MG tablet Take 1 tablet (10 mg total) by mouth every morning.     colchicine  0.6 MG tablet Take 1 tablet (0.6 mg total) by mouth daily as needed (gout flare).     Continuous Glucose Sensor (FREESTYLE LIBRE 3 SENSOR) MISC Place 1 sensor on the skin every 14 days. Use to check glucose continuously 2 each 11   dapagliflozin  propanediol (FARXIGA ) 10 MG TABS tablet Take 1 tablet (10 mg total) by mouth daily. 90 tablet 3   fenofibrate  (TRICOR ) 145 MG tablet Take 1 tablet (145 mg total) by mouth every evening.     fluticasone  (FLONASE ) 50 MCG/ACT nasal spray Place 2 sprays into both nostrils daily as needed for allergies or rhinitis. Home med.     Galcanezumab-gnlm 120 MG/ML SOAJ Inject 120 mg into the skin. Monthly     Insulin  Glargine (BASAGLAR  KWIKPEN) 100 UNIT/ML Inject 36 Units into the skin at bedtime. 45 mL 3   insulin  lispro (HUMALOG ) 100 UNIT/ML injection Inject 10 Units into the skin 3 (three) times daily before meals.     Insulin  Pen Needle 32G X 4 MM MISC 1 each by Does not apply route in the morning, at noon, in the evening, and at bedtime. 120 each 11   ipratropium-albuterol  (DUONEB) 0.5-2.5 (3) MG/3ML SOLN Take 3 mLs by nebulization every 6 (six) hours as needed. 360 mL 11   meclizine  (ANTIVERT ) 25 MG tablet Take 0.5 tablets (12.5 mg total) by mouth 3 (three) times daily as needed for dizziness.     nitroGLYCERIN   (NITROSTAT ) 0.4 MG SL tablet Place 1 tablet (0.4 mg total) under the tongue every 5 (five) minutes x 3 doses as needed for chest pain. 30 tablet 0   omeprazole  (PRILOSEC) 40 MG capsule Take 1 capsule (40 mg total) by mouth every morning.     potassium chloride  SA (KLOR-CON  M) 20 MEQ tablet Take 1 tablet (20 mEq total) by mouth every other day. AM     Resmetirom  (REZDIFFRA ) 100 MG TABS TAKE 1 TABLET BY MOUTH DAILY WITH OR WITHOUT FOOD 30 tablet 1   rivaroxaban  (XARELTO ) 20 MG TABS tablet Take 1 tablet (20 mg total) by mouth daily with supper. 90 tablet 3   rosuvastatin  (CRESTOR ) 20 MG tablet Take 1 tablet (20 mg total) by mouth at bedtime.     sacubitril -valsartan  (ENTRESTO ) 24-26 MG Take 1 tablet by mouth 2 (two) times daily. 60 tablet 2   Semaglutide , 2 MG/DOSE, (OZEMPIC , 2 MG/DOSE,) 8 MG/3ML SOPN Inject 2 mg into the skin once a week. Tuesdays 3 mL 2   spironolactone  (ALDACTONE ) 25 MG tablet Take 1 tablet by mouth once daily 90 tablet 1   TALTZ 80 MG/ML SOAJ Inject 3 mLs into the skin every 28 (twenty-eight) days.     torsemide  (DEMADEX ) 20 MG tablet Take 1 tablet (20 mg total) by mouth every morning.     umeclidinium-vilanterol (ANORO ELLIPTA ) 62.5-25 MCG/ACT AEPB Inhale 1 puff into the lungs every morning.     Accu-Chek Softclix Lancets lancets USE UP  TO FOUR TIMES DAILY     albuterol  (VENTOLIN  HFA) 108 (90 Base) MCG/ACT inhaler Inhale 2 puffs into the lungs every 4 (four) hours as needed for wheezing or shortness of breath. 8 g 6   allopurinol  (ZYLOPRIM ) 300 MG tablet Take 300 mg by mouth every morning.     amiodarone  (PACERONE ) 200 MG tablet Take 1 tablet (200 mg total) by mouth daily. 90 tablet 3   No current facility-administered medications for this visit.   Vitals:   01/21/24 1007  BP: 124/80  Pulse: 89  SpO2: 97%  Weight: 238 lb 3.2 oz (108 kg)   Wt Readings from Last 3 Encounters:  01/21/24 238 lb 3.2 oz (108 kg)  01/01/24 238 lb (108 kg)  12/31/23 238 lb (108 kg)   Lab  Results  Component Value Date   CREATININE 1.50 (H) 01/01/2024   CREATININE 1.33 (H) 12/12/2023   CREATININE 1.42 (H) 12/10/2023    PHYSICAL EXAM: General:  Sitting in chiar. No resp difficulty HEENT: normal Neck: supple. no JVD. Carotids 2+ bilat; no bruits. No lymphadenopathy or thryomegaly appreciated. Cor: PMI nondisplaced. Regular rate & rhythm. No rubs, gallops or murmurs. Lungs: clear Abdomen: soft, nontender, nondistended. No hepatosplenomegaly. No bruits or masses. Good bowel sounds. Extremities: no cyanosis, clubbing, rash, edema Neuro: alert & orientedx3, cranial nerves grossly intact. moves all 4 extremities w/o difficulty. Affect pleasant    ASSESSMENT & PLAN:  1.  Chronic HFrEF/Mixed Ischemic and nonischemic cardiomyopathy:   - Mixed ICM/NICM - Improved NYHA II-III - Volume status looks good. Continue torsemide  40 daily. Discussed use of sliding scale as needed - s/p [employer] Sq-ICD; denies any shocks - Echo 05/21/21: EF of 40-45% along with mild LVH and mild Gabriel.  - Echo 11/24/21: EF of 20-25% along with mild LVH and mild Gabriel.  - Echo 10/1823: EF 35-40%, global LV hypokinesis, severely dilated LV internal cavity size, mild LVH, small pericardial effusion, mild Gabriel. - Echo 5/25 EF 35-40% mild to moderate AI (appears moremild) - continue carvedilol  to 6.25mg  BID (couldn't tolerate 12.5 due to fatigue) - continue Farxiga  10mg  daily - continue Entresto  24/26mg  bid. Will not increase with epsidose of orthostasis  - continue spiro 25mg  daily - consider Cardiomems to help volume management as needed => his insurance won't cover - labs today   2.  CAD/Elevated Troponin and chronic CP:  - H/o CAD  - RHC/ LHC 08/19/23:     1. Relatively stable appearance of the coronary arteries since last catheterization in 2021.  Chronically occluded proximal RCA now has antegrade flow, though I suspect this represents bridging collaterals.  Left-to-right collateral remain as well as  moderate LAD and LCx disease.    2. Mildly elevated left heart filling pressures (LVEDP 20 mmHg, PCWP 18 mmHg).    3. Moderate pulmonary hypertension (mean PA 41 mmHg, PVR 5.3 WU).    4. Moderately reduced Fick cardiac output/index (CO 4.3 L/min, CI 1.9 L/min/m^2). - continue rosuvastatin  20mg  daily - continue isosorbide  MN 15mg  daily - Follows with Dr. Gollan   3.  HTN:  - BP well controlled    4.  H/o PE/Chronic anticoagulation:  - continue xarelto  20mg  daily - no bleeding   5.  DM2- - saw nephrology (Korrapati) 08/23  - A1c 07/11/23 reviewed and was 6.6% - Continue Farxiga    6. OSA- - sleep study 11/24 AHI 70.0 - wearing CPAP - saw pulmonology Gaynell Keeler) 11/24  7. PAF - cardioverted 11/06/23 - on amio 200 daily  remains in NSR - continue ASA 81mg  daily - continue xarelto  20mg  daily - Has seen Dr. Marven Slimmer. Plan for possible AF ablation. I messaged him today  8. ETOH/tobacco use - has quit   9. Aortic insufficiency - echo reviewed from 5/25 - AI appears mild to moderate at worst. Can Get TEE as needed - medical management    Jules Oar, MD 01/21/24

## 2024-01-21 NOTE — Telephone Encounter (Signed)
   Pre-operative Risk Assessment    Patient Name: Gabriel [name].  DOB: [birth year] MRN: [id number]   Date of last office visit: 01/21/24 Date of next office visit: 02/24/24   Request for Surgical Clearance    Procedure:  Excision Scalp Mass  Date of Surgery:  Clearance 01/28/24                                Surgeon:  Dr. Rosea Conch Surgeon's Group or Practice Name:  Dr. Rosea Conch  Phone number:  [phone] Fax number:  [phone]   Type of Clearance Requested:   - Medical  - Pharmacy:  Hold Aspirin      Type of Anesthesia:  Not Indicated   Additional requests/questions:    [name]   01/21/2024, 2:35 PM

## 2024-01-21 NOTE — Telephone Encounter (Signed)
   Patient Name: Gabriel [name].  DOB: [birth year] MRN: [id number]  Primary Cardiologist: Belva Boyden, MD  Chart reviewed as part of pre-operative protocol coverage. Given past medical history and time since last visit, based on ACC/AHA guidelines, Gabriel N Dejarnette Jr. is at acceptable risk for the planned procedure without further cardiovascular testing.   The patient was advised that if he develops new symptoms prior to surgery to contact our office to arrange for a follow-up visit, and he verbalized understanding.  Patient can hold ASA 81 mg 5 to 7 days prior to procedure and restart postprocedure when surgically safe and hemostasis is achieved.  I will route this recommendation to the requesting party via Epic fax function and remove from pre-op  pool.  Please call with questions.  Francene Ing, Retha Cast, NP 01/21/2024, 5:14 PM

## 2024-01-22 ENCOUNTER — Ambulatory Visit (HOSPITAL_COMMUNITY): Payer: Self-pay | Admitting: Internal Medicine

## 2024-01-22 ENCOUNTER — Ambulatory Visit: Payer: Self-pay | Admitting: General Surgery

## 2024-01-22 DIAGNOSIS — I5022 Chronic systolic (congestive) heart failure: Secondary | ICD-10-CM

## 2024-01-22 LAB — BASIC METABOLIC PANEL WITH GFR
BUN/Creatinine Ratio: 17 (ref 9–20)
BUN: 30 mg/dL — ABNORMAL HIGH (ref 6–24)
CO2: 23 mmol/L (ref 20–29)
Calcium: 10.5 mg/dL — ABNORMAL HIGH (ref 8.7–10.2)
Chloride: 98 mmol/L (ref 96–106)
Creatinine, Ser: 1.74 mg/dL — ABNORMAL HIGH (ref 0.76–1.27)
Glucose: 171 mg/dL — ABNORMAL HIGH (ref 70–99)
Potassium: 4.3 mmol/L (ref 3.5–5.2)
Sodium: 142 mmol/L (ref 134–144)
eGFR: 46 mL/min/{1.73_m2} — ABNORMAL LOW (ref >59)

## 2024-01-22 LAB — BRAIN NATRIURETIC PEPTIDE: BNP: 84.9 pg/mL (ref 0.0–100.0)

## 2024-01-22 NOTE — H&P (Signed)
 Subjective:  CC: Scalp mass [R22.0]  History of Present Illness Gabriel [name] is a 53 year old male with a significant cardiac history who presents with headaches and concerns about a soft tissue mass on the forehead.  He has had a soft tissue mass on his forehead for several years, which has recently increased in size and become noticeable to others. Aggravating factors include pressure to area. Patient has been experiencing headaches localized to the area of the mass. Pain does not radiate. No alleviating factors mentioned. He has not noticed any drainage coming from this mass.  He is currently taking Xarelto  for anticoagulation and has previously undergone surgery for a defibrillator placement. He is also on Ozempic , which he needs to stop prior to any procedure.  Past Medical History: has a past medical history of Acute nasopharyngitis (common cold), Adjustment disorder with mixed anxiety and depressed mood (08/20/2011), Allergic rhinitis due to pollen, Apnea, Atrial fibrillation (CMS/HHS-HCC), Cramp of limb, Dizziness and giddiness, Epistaxis, Essential hypertension, benign, Headache(784.0), Hypercalcemia, Hypertrophy of salivary gland, Migraine (08/19/1997), Mixed hyperlipidemia, Obesity, unspecified, Obstructive sleep apnea (adult) (pediatric), Osteoarthrosis, unspecified whether generalized or localized, other specified sites, Other chronic nonalcoholic liver disease, Spasm of muscle, Type II or unspecified type diabetes mellitus without mention of complication, not stated as uncontrolled (CMS/HHS-HCC), and Unspecified vitamin D deficiency.  Past Surgical History: has a past surgical history that includes Amputation (Left) and arthroscopy shoulder (Left, 09/16/2023).  Family History: family history includes Diabetes type II in his mother; High blood pressure (Hypertension) in his mother; Other in his mother; Thyroid  disease in his mother.  Social History: reports that he has quit smoking.  His smoking use included cigarettes and pipe. He has quit using smokeless tobacco. He reports that he does not currently use alcohol. He reports that he does not currently use drugs.  Current Medications: has a current medication list which includes the following prescription(s): accu-chek softclix lancets, allopurinol , amiodarone , amlodipine , anoro ellipta , aspirin , azelastine , basaglar  kwikpen u-100 insulin , bd nano 2nd gen pen needle, bupropion , butalbital -acetaminophen -caffeine , carvedilol , carvedilol , carvedilol , cetirizine , colchicine , dapagliflozin  propanediol, enbrel sureclick, entresto , fenofibrate  nanocrystallized, fluoxetine , fluticasone  propionate, freestyle libre 3 sensor, insulin  glargine-yfgn, insulin  lispro, ipratropium-albuterol , isosorbide  mononitrate, taltz autoinjector, meclizine , naltrexone , nitroglycerin , novolog  flexpen u-100 insulin , omeprazole , oxycodone , ozempic , potassium chloride , rezdiffra , rivaroxaban , rosuvastatin , spironolactone , torsemide , vascepa , and ventolin  hfa.  Allergies: Allergies Allergen Reactions Metformin  Other (See Comments) Morphine  Unknown Ranitidine  Hcl Other (See Comments)  ROS: A 15 point review of systems was performed and pertinent positives and negatives noted in HPI  Objective:   BP (!) 154/96  Pulse 72  Ht 180.3 cm (5\' 11" )  Wt (!) 108 kg (238 lb)  BMI 33.19 kg/m  Constitutional : No distress, cooperative, alert Cardiovascular: Normal rate, regular rhythm Pulmonary: Normal breath sounds, clear to auscultation bilaterally Musculoskeletal: Steady gait and movement Skin: Cool and moist, fatty mass noted on forehead, non-mobile Psychiatric: Normal affect, non-agitated, not confused    LABS: N/A  RADS: N/A  Assessment & Plan  Assessment:   Scalp mass [R22.0] Soft tissue mass increasing in size and causing discomfort. Differential includes lipoma or other fatty mass. Surgical removal required due to location and  anticoagulation therapy. Requesting cardiac clearance prior to surgery. Plan to stop Xarelto  a few days before procedure and Ozempic  a week prior.  Plan:   1. Scalp mass [R22.0] Discussed surgical excision. Alternatives include continued observation. Benefits include possible symptom relief, pathologic evaluation, improved cosmesis. Discussed the risk of surgery including recurrence, chronic pain, post-op  infxn, poor cosmesis, poor/delayed wound healing, and possible re-operation to address said risks. The risks of general anesthetic, if used, includes MI, CVA, sudden death or even reaction to anesthetic medications also discussed. Typical post-op recovery time of 3-5 days with possible activity restrictions were also discussed. Advise on post-op care: expect scar, showering allowed same day, no major dressing changes, glue on outside, stitches inside.  The patient verbalized understanding and all questions were answered to the patient's satisfaction.  Encouraged patient to discuss with primary care provider of other potential causes of his headaches as the fatty mass is likely an unrelated cause.  2. Patient has elected to proceed with surgical treatment. Procedure will be scheduled.  labs/images/medications/previous chart entries reviewed personally and relevant changes/updates noted above.  Eldred Grego, MD

## 2024-01-22 NOTE — H&P (View-Only) (Signed)
 Subjective:  CC: Scalp mass [R22.0]  History of Present Illness Gabriel [name] is a 53 year old male with a significant cardiac history who presents with headaches and concerns about a soft tissue mass on the forehead.  He has had a soft tissue mass on his forehead for several years, which has recently increased in size and become noticeable to others. Aggravating factors include pressure to area. Patient has been experiencing headaches localized to the area of the mass. Pain does not radiate. No alleviating factors mentioned. He has not noticed any drainage coming from this mass.  He is currently taking Xarelto  for anticoagulation and has previously undergone surgery for a defibrillator placement. He is also on Ozempic , which he needs to stop prior to any procedure.  Past Medical History: has a past medical history of Acute nasopharyngitis (common cold), Adjustment disorder with mixed anxiety and depressed mood (08/20/2011), Allergic rhinitis due to pollen, Apnea, Atrial fibrillation (CMS/HHS-HCC), Cramp of limb, Dizziness and giddiness, Epistaxis, Essential hypertension, benign, Headache(784.0), Hypercalcemia, Hypertrophy of salivary gland, Migraine (08/19/1997), Mixed hyperlipidemia, Obesity, unspecified, Obstructive sleep apnea (adult) (pediatric), Osteoarthrosis, unspecified whether generalized or localized, other specified sites, Other chronic nonalcoholic liver disease, Spasm of muscle, Type II or unspecified type diabetes mellitus without mention of complication, not stated as uncontrolled (CMS/HHS-HCC), and Unspecified vitamin D deficiency.  Past Surgical History: has a past surgical history that includes Amputation (Left) and arthroscopy shoulder (Left, 09/16/2023).  Family History: family history includes Diabetes type II in his mother; High blood pressure (Hypertension) in his mother; Other in his mother; Thyroid  disease in his mother.  Social History: reports that he has quit smoking.  His smoking use included cigarettes and pipe. He has quit using smokeless tobacco. He reports that he does not currently use alcohol. He reports that he does not currently use drugs.  Current Medications: has a current medication list which includes the following prescription(s): accu-chek softclix lancets, allopurinol , amiodarone , amlodipine , anoro ellipta , aspirin , azelastine , basaglar  kwikpen u-100 insulin , bd nano 2nd gen pen needle, bupropion , butalbital -acetaminophen -caffeine , carvedilol , carvedilol , carvedilol , cetirizine , colchicine , dapagliflozin  propanediol, enbrel sureclick, entresto , fenofibrate  nanocrystallized, fluoxetine , fluticasone  propionate, freestyle libre 3 sensor, insulin  glargine-yfgn, insulin  lispro, ipratropium-albuterol , isosorbide  mononitrate, taltz autoinjector, meclizine , naltrexone , nitroglycerin , novolog  flexpen u-100 insulin , omeprazole , oxycodone , ozempic , potassium chloride , rezdiffra , rivaroxaban , rosuvastatin , spironolactone , torsemide , vascepa , and ventolin  hfa.  Allergies: Allergies Allergen Reactions Metformin  Other (See Comments) Morphine  Unknown Ranitidine  Hcl Other (See Comments)  ROS: A 15 point review of systems was performed and pertinent positives and negatives noted in HPI  Objective:   BP (!) 154/96  Pulse 72  Ht 180.3 cm (5\' 11" )  Wt (!) 108 kg (238 lb)  BMI 33.19 kg/m  Constitutional : No distress, cooperative, alert Cardiovascular: Normal rate, regular rhythm Pulmonary: Normal breath sounds, clear to auscultation bilaterally Musculoskeletal: Steady gait and movement Skin: Cool and moist, fatty mass noted on forehead, non-mobile Psychiatric: Normal affect, non-agitated, not confused    LABS: N/A  RADS: N/A  Assessment & Plan  Assessment:   Scalp mass [R22.0] Soft tissue mass increasing in size and causing discomfort. Differential includes lipoma or other fatty mass. Surgical removal required due to location and  anticoagulation therapy. Requesting cardiac clearance prior to surgery. Plan to stop Xarelto  a few days before procedure and Ozempic  a week prior.  Plan:   1. Scalp mass [R22.0] Discussed surgical excision. Alternatives include continued observation. Benefits include possible symptom relief, pathologic evaluation, improved cosmesis. Discussed the risk of surgery including recurrence, chronic pain, post-op  infxn, poor cosmesis, poor/delayed wound healing, and possible re-operation to address said risks. The risks of general anesthetic, if used, includes MI, CVA, sudden death or even reaction to anesthetic medications also discussed. Typical post-op recovery time of 3-5 days with possible activity restrictions were also discussed. Advise on post-op care: expect scar, showering allowed same day, no major dressing changes, glue on outside, stitches inside.  The patient verbalized understanding and all questions were answered to the patient's satisfaction.  Encouraged patient to discuss with primary care provider of other potential causes of his headaches as the fatty mass is likely an unrelated cause.  2. Patient has elected to proceed with surgical treatment. Procedure will be scheduled.  labs/images/medications/previous chart entries reviewed personally and relevant changes/updates noted above.  Gabriel Grego, MD

## 2024-01-23 ENCOUNTER — Telehealth: Payer: Self-pay

## 2024-01-23 ENCOUNTER — Encounter: Payer: Self-pay | Admitting: Urgent Care

## 2024-01-23 ENCOUNTER — Other Ambulatory Visit: Payer: Self-pay

## 2024-01-23 ENCOUNTER — Encounter
Admission: RE | Admit: 2024-01-23 | Discharge: 2024-01-23 | Disposition: A | Discharge status: Home / Self Care | Source: Ambulatory Visit | Attending: General Surgery | Admitting: General Surgery

## 2024-01-23 DIAGNOSIS — I480 Paroxysmal atrial fibrillation: Secondary | ICD-10-CM

## 2024-01-23 DIAGNOSIS — Z794 Long term (current) use of insulin: Secondary | ICD-10-CM

## 2024-01-23 HISTORY — DX: Gastro-esophageal reflux disease without esophagitis: K21.9

## 2024-01-23 HISTORY — DX: Personal history of urinary calculi: Z87.442

## 2024-01-23 HISTORY — DX: Presence of automatic (implantable) cardiac defibrillator: Z95.810

## 2024-01-23 HISTORY — DX: Dyspnea, unspecified: R06.00

## 2024-01-23 HISTORY — DX: Angina pectoris, unspecified: I20.9

## 2024-01-23 HISTORY — DX: Headache, unspecified: R51.9

## 2024-01-23 NOTE — Telephone Encounter (Signed)
 Called pt to inform him that due to a schedule change in the lab we needed to reschedule his Afib Ablation.  He has been moved from 8/21 to 8/29 at 10:00 AM.   He will need labs prior to CT due to being diabetic.  He is aware someone will be in contact with him to schedule CT.

## 2024-01-23 NOTE — Telephone Encounter (Signed)
-----   Message from CAMERON T LAMBERT sent at 01/23/2024  9:46 AM EDT ----- Yes.  Will do, Dan.  Carly and April, can we get this patient penciled in for a catheter ablation in that timeframe?  And then get him on my schedule to review the procedure again before the procedure date. Thanks, Donelda Fujita ----- Message ----- From: Mardell Shade, MD Sent: 01/21/2024  10:39 AM EDT To: Boyce Byes, MD  Hey.   On echo AI appears mild to moderate (doesn't need surgery)  Looks like you were thinking AF ablation in that case. His insurance runs out end of August. Can you get him in?  Thanks

## 2024-01-23 NOTE — Patient Instructions (Addendum)
 Your procedure is scheduled on: 01/28/24 - Wednesday Report to the Registration Desk on the 1st floor of the [facility]. To find out your arrival time, please call [phone] between 1PM - 3PM on: 01/27/24 - Tuesday If your arrival time is 6:00 am, do not arrive before that time as the [facility] entrance doors do not open until 6:00 am.  REMEMBER: Instructions that are not followed completely may result in serious medical risk, up to and including death; or upon the discretion of your surgeon and anesthesiologist your surgery may need to be rescheduled.  Do not eat food or drink any liquids after midnight the night before surgery.  No gum chewing or hard candies.   One week prior to surgery: Stop Anti-inflammatories (NSAIDS) such as Advil , Aleve , Ibuprofen , Motrin , Naproxen , Naprosyn  and Aspirin  based products such as Excedrin, Goody's Powder, BC Powder.  Stop ANY OVER THE COUNTER supplements until after surgery.   dapagliflozin  propanediol (FARXIGA ) -  stop beginning 06/08, may resume after surgery.  rivaroxaban  (XARELTO ) -  hold beginning 06/08 , resume with doctor order  Insulin  Glargine (BASAGLAR  KWIKPEN) 100 UNIT/ML - only inject 1/2 of your prescribed dose on the night before your surgery.  insulin  lispro (HUMALOG ) hold on the morning of surgery. May resume with meals.  OZEMPIC  hold 7 days prior to your surgery, may resume after surgery.   ON THE DAY OF SURGERY ONLY TAKE THESE MEDICATIONS WITH SIPS OF WATER:  allopurinol  (ZYLOPRIM )  amiodarone  (PACERONE )  carvedilol  (COREG )  DUONEB  omeprazole  (PRILOSEC)  Resmetirom  (REZDIFFRA )  ANORO ELLIPTA    Use inhalers on the day of surgery and bring to the hospital.   No Alcohol for 24 hours before or after surgery.  No Smoking including e-cigarettes for 24 hours before surgery.  No chewable tobacco products for at least 6 hours before surgery.  No nicotine  patches on the day of surgery.  Do not use any  "recreational" drugs for at least a week (preferably 2 weeks) before your surgery.  Please be advised that the combination of cocaine and anesthesia may have negative outcomes, up to and including death. If you test positive for cocaine, your surgery will be cancelled.  On the morning of surgery brush your teeth with toothpaste and water, you may rinse your mouth with mouthwash if you wish. Do not swallow any toothpaste or mouthwash.  Do not wear jewelry, make-up, hairpins, clips or nail polish.  For welded (permanent) jewelry: bracelets, anklets, waist bands, etc.  Please have this removed prior to surgery.  If it is not removed, there is a chance that hospital personnel will need to cut it off on the day of surgery.  Do not wear lotions, powders, or perfumes.   Do not shave body hair from the neck down 48 hours before surgery.  Contact lenses, hearing aids and dentures may not be worn into surgery.  Do not bring valuables to the hospital. [facility] is not responsible for any missing/lost belongings or valuables.   Bring your C-PAP to the hospital in case you may have to spend the night.   Notify your doctor if there is any change in your medical condition (cold, fever, infection).  Wear comfortable clothing (specific to your surgery type) to the hospital.  After surgery, you can help prevent lung complications by doing breathing exercises.  Take deep breaths and cough every 1-2 hours. Your doctor may order a device called an Incentive Spirometer to help you take deep breaths.  When  coughing or sneezing, hold a pillow firmly against your incision with both hands. This is called "splinting." Doing this helps protect your incision. It also decreases belly discomfort.  If you are being admitted to the hospital overnight, leave your suitcase in the car. After surgery it may be brought to your room.  In case of increased patient census, it may be necessary for you, the patient, to  continue your postoperative care in the Same Day Surgery department.  If you are being discharged the day of surgery, you will not be allowed to drive home. You will need a responsible individual to drive you home and stay with you for 24 hours after surgery.   If you are taking public transportation, you will need to have a responsible individual with you.  Please call the Pre-admissions Testing Dept. at [phone] if you have any questions about these instructions.  Surgery Visitation Policy:  Patients having surgery or a procedure may have two visitors.  Children under the age of 42 must have an adult with them who is not the patient.  Inpatient Visitation:    Visiting hours are 7 a.m. to 8 p.m. Up to four visitors are allowed at one time in a patient room. The visitors may rotate out with other people during the day.  One visitor age 97 or older may stay with the patient overnight and must be in the room by 8 p.m.

## 2024-01-23 NOTE — Telephone Encounter (Signed)
 Spoke with the patient and scheduled him for an ablation with Dr. Marven Slimmer on 8/21. Office visit has been scheduled for 7/30.

## 2024-01-26 ENCOUNTER — Other Ambulatory Visit: Payer: Self-pay | Admitting: *Deleted

## 2024-01-26 DIAGNOSIS — D751 Secondary polycythemia: Secondary | ICD-10-CM

## 2024-01-27 ENCOUNTER — Inpatient Hospital Stay: Payer: Medicaid Other

## 2024-01-27 ENCOUNTER — Encounter: Payer: Self-pay | Admitting: General Surgery

## 2024-01-27 ENCOUNTER — Inpatient Hospital Stay (HOSPITAL_BASED_OUTPATIENT_CLINIC_OR_DEPARTMENT_OTHER): Payer: Medicaid Other | Admitting: Oncology

## 2024-01-27 ENCOUNTER — Encounter: Payer: Self-pay | Admitting: Oncology

## 2024-01-27 ENCOUNTER — Inpatient Hospital Stay: Payer: Medicaid Other | Attending: Oncology

## 2024-01-27 VITALS — BP 128/80 | HR 81 | Temp 97.1°F | Resp 20 | Ht 71.0 in | Wt 246.4 lb

## 2024-01-27 DIAGNOSIS — Z86711 Personal history of pulmonary embolism: Secondary | ICD-10-CM | POA: Diagnosis not present

## 2024-01-27 DIAGNOSIS — D751 Secondary polycythemia: Secondary | ICD-10-CM | POA: Diagnosis not present

## 2024-01-27 DIAGNOSIS — Z87891 Personal history of nicotine dependence: Secondary | ICD-10-CM | POA: Diagnosis not present

## 2024-01-27 LAB — IRON AND TIBC
Iron: 77 ug/dL (ref 45–182)
Saturation Ratios: 18 % (ref 17.9–39.5)
TIBC: 441 ug/dL (ref 250–450)
UIBC: 364 ug/dL

## 2024-01-27 LAB — CBC WITH DIFFERENTIAL/PLATELET
Abs Immature Granulocytes: 0.07 10*3/uL (ref 0.00–0.07)
Basophils Absolute: 0.1 10*3/uL (ref 0.0–0.1)
Basophils Relative: 1 %
Eosinophils Absolute: 0.2 10*3/uL (ref 0.0–0.5)
Eosinophils Relative: 2 %
HCT: 50.7 % (ref 39.0–52.0)
Hemoglobin: 16.3 g/dL (ref 13.0–17.0)
Immature Granulocytes: 1 %
Lymphocytes Relative: 27 %
Lymphs Abs: 2.3 10*3/uL (ref 0.7–4.0)
MCH: 26.8 pg (ref 26.0–34.0)
MCHC: 32.1 g/dL (ref 30.0–36.0)
MCV: 83.3 fL (ref 80.0–100.0)
Monocytes Absolute: 0.5 10*3/uL (ref 0.1–1.0)
Monocytes Relative: 6 %
Neutro Abs: 5.3 10*3/uL (ref 1.7–7.7)
Neutrophils Relative %: 63 %
Platelets: 189 10*3/uL (ref 150–400)
RBC: 6.09 MIL/uL — ABNORMAL HIGH (ref 4.22–5.81)
RDW: 17.2 % — ABNORMAL HIGH (ref 11.5–15.5)
WBC: 8.5 10*3/uL (ref 4.0–10.5)
nRBC: 0 % (ref 0.0–0.2)

## 2024-01-27 LAB — FERRITIN: Ferritin: 38 ng/mL (ref 24–336)

## 2024-01-27 NOTE — Telephone Encounter (Addendum)
 Provider message below reviewed with patient. Pt aware, agreeable, and verbalized understanding. BMET order placed.     ----- Message from Nurse Emer C sent at 01/23/2024  9:14 AM EDT -----  ----- Message ----- From: Mardell Shade, MD Sent: 01/22/2024   1:17 PM EDT To: [facility] Triage Pool  Scr up a bit. Please repeat BMET in 2 weeks

## 2024-01-27 NOTE — Progress Notes (Signed)
No phlebotomy needed today.

## 2024-01-27 NOTE — Progress Notes (Signed)
 [facility]  [phone] [phone]  ID: Gabriel [name]. OB: 01-Aug-1971  MR#: [id number]  AOZ#:[id number]  Patient Care Team: Tasia Farr, FNP as PCP - General (Family Medicine) Devorah Fonder, MD as PCP - Cardiology (Cardiology) Boyce Byes, MD as PCP - Electrophysiology (Cardiology) Charlette Console, FNP as Nurse Practitioner (Family Medicine) Devorah Fonder, MD as Consulting Physician (Cardiology) Alla Isaacs, Severa Daniels, RPH-CPP as Pharmacist  CHIEF COMPLAINT: Secondary polycythemia.  INTERVAL HISTORY: Patient returns to clinic today for repeat laboratory work, further evaluation, and consideration of additional phlebotomy.  He is still recovering from his rotator cuff repair several months ago, but his pain significantly improved.  He is having surgery tomorrow to remove a cyst from his forehead and then also plans to have a cardiac procedure completed in August 2025.  He currently feels well.  He has no neurologic complaints.  He denies any recent fevers or illnesses.  He has a good appetite and denies weight loss.  He does not complain of weakness or fatigue today.  He has no chest pain, shortness of breath, cough, or hemoptysis.  He denies any nausea, vomiting, constipation, or diarrhea.  He has no urinary complaints.  Patient offers no further specific complaints today.  REVIEW OF SYSTEMS:   Review of Systems  Constitutional: Negative.  Negative for fever, malaise/fatigue and weight loss.  Respiratory: Negative.  Negative for cough, hemoptysis and shortness of breath.   Cardiovascular: Negative.  Negative for chest pain and leg swelling.  Gastrointestinal: Negative.  Negative for abdominal pain.  Genitourinary: Negative.  Negative for dysuria.  Musculoskeletal:  Positive for joint pain. Negative for back pain.  Skin: Negative.  Negative for rash.  Neurological: Negative.  Negative for dizziness, focal weakness, weakness and  headaches.  Psychiatric/Behavioral: Negative.  The patient is not nervous/anxious.     As per HPI. Otherwise, a complete review of systems is negative.  PAST MEDICAL HISTORY: Past Medical History:  Diagnosis Date   Anginal pain (HCC)    Aortic atherosclerosis (HCC)    Aortic root dilation (HCC) 11/28/2016   a.) TTE 11/28/2016: 43 mm; b.) TTE 11/23/2019: 38 mm; c.) TTE 08/14/2022: 40 mm; d.) TTE 03/08/2023: 43 mm; e.) TTE 01/02/2024: 40 mm   Arteriosclerosis of right carotid artery    a.) carotid doppler 04/29/2017: <50% RICA   Atrial fibrillation with RVR (HCC)    a.) CHA2DS2VASc = 6 (HFrEF, HTN, CVA x 2, vascular disease, T2DM) as of 01/27/2024; b.) s/p DCCV 11/06/2023 --> 200 J x 1 -->  NSR; c.) rate/rhythm maintained on oral amiodarone ; chronically anticoagulated with rivaroxaban    CAD (coronary artery disease)    a.) MV 04/2015: low risk; b.) LHC 01/06/2017: minor irregs in LAD/Diag/LCX/OM, RCA 40p/m/d; c.) R/LHC 06/13/2020: 50% dLAD, 30% p-mLCx, 40% OM1, 100% pRCA (L-R collats) - med mgmt; d.) [facility] 08/19/2023: 50% dLAD, 30% p-mLCx, 40% OM1, 100% pRCA (L-R collats), 50% p-mRCA - med mgmt   Cardiomegaly    Cerebellar infarct (HCC)    a.) brain MRI 06/23/2023: tiny chronic infarct within the RIGHT cerebellar hemisphere; unsure of timing of neurological event; no deficits   Chest wall pain, chronic    Chronic 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    Chronic Troponin Elevation    CKD (chronic kidney disease), stage II-III    COPD (chronic obstructive pulmonary disease) (HCC)    Depression    DM (diabetes mellitus), type 2 (HCC)    Dyspnea    GERD (  gastroesophageal reflux disease)    Headache    HFrEF (heart failure with reduced ejection fraction) (HCC)    a.) EF 03/20/2015: EF 45-50%; b.) MV 05/19/2015: EF 30-44%; c.) TTE 01/04/2016: EF 20-25%; d.) TTE 02/29/2016: EF 30-35%; e.) TTE 11/28/2016: EF 40-45%; f.) LHC 01/06/2017: EF 25-35%; g.) TTE 04/29/2017: EF 45-50%; h.) TTE 06/26/2019: EF  30-35%; i.) TTE 11/23/2019: EF 25-30%; j.) TTE 06/08/2020: EF 35-40%; k.) TTE 05/21/2021: EF 40-45%; l.) TTE 08/14/2022: EF 40-45%; m.) TTE 03/08/2023: EF 40-45%   History of 2019 novel coronavirus disease (COVID-19) 03/01/2022   History of kidney stones    History of methicillin resistant staphylococcus aureus (MRSA)    Hyperlipidemia    Hypertension    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 12/2015 noted on tele-->amio;  b. 12/2015 Event monitor: no VT noted.   Obesity (BMI 30.0-34.9)    On rivaroxaban  therapy    OSA treated with BiPAP    Polyp of descending colon    Psoriasis    Psoriatic arthritis (HCC)    a.) Tx'd with ixekizumab   Pulmonary HTN (HCC) 02/29/2016   a.) TTE 02/29/2016: RVSP 47; b.) TTE 06/26/2019: RVSP 58.4; c.) TTE 12/08/2019: RVSP 40.8; d.) TTE 06/08/2020: RVSP 44.7; e.) R/LHC 06/08/2020: mRA 9, mPA 40, mPWCP 17, AO sat 98, PA sat 61, CO 3.1, CI 1.3, PVR 7.4, LVEDP 20; f.) R/LHC 08/18/2020: mRA 5, mPA 41, mPCWP 18, AO sat 92, PA sat 59, CO 4.3, CI 1.9, PVR 5.3   Recurrent pulmonary emboli (HCC) 06/07/2020   06/07/20: small bilateral PEs.  12/31/19: RUL and RLL PEs.   Syncope    a. 01/2016 - felt to be vasovagal.   Thrombocytopenia (HCC)     PAST SURGICAL  HISTORY: Past Surgical History:  Procedure Laterality Date   CARDIOVERSION N/A 11/06/2023   Procedure: CARDIOVERSION;  Surgeon: Lauralee Poll, MD;  Location: [facility] OR[facility];  Service: Cardiovascular;  Laterality: N/A;   cardioverted 11/06/23     COLONOSCOPY WITH PROPOFOL  N/A 08/01/2022   Procedure: COLONOSCOPY WITH PROPOFOL ;  Surgeon: Selena Daily, MD;  Location: [facility];  Service: Gastroenterology;  Laterality: N/A;   ESOPHAGOGASTRODUODENOSCOPY (EGD) WITH PROPOFOL  N/A 08/01/2022   Procedure: ESOPHAGOGASTRODUODENOSCOPY (EGD) WITH PROPOFOL ;  Surgeon: Selena Daily, MD;  Location: [facility];  Service: Gastroenterology;  Laterality: N/A;   ESOPHAGOGASTRODUODENOSCOPY (EGD) WITH PROPOFOL  N/A 10/16/2022   Procedure: ESOPHAGOGASTRODUODENOSCOPY (EGD) WITH PROPOFOL ;  Surgeon: Selena Daily, MD;  Location: [facility];  Service: Gastroenterology;  Laterality: N/A;   FINGER AMPUTATION Left    Traumatic   LEFT HEART CATH AND CORONARY ANGIOGRAPHY N/A 01/06/2017   Procedure: Left Heart Cath and Coronary Angiography;  Surgeon: Wenona Hamilton, MD;  Location: [facility];  Service: Cardiovascular;  Laterality: N/A;   RIGHT/LEFT  HEART CATH AND CORONARY ANGIOGRAPHY N/A 06/13/2020   Procedure: RIGHT/LEFT HEART CATH AND CORONARY ANGIOGRAPHY;  Surgeon: Sammy Crisp, MD;  Location: [facility];  Service: Cardiovascular;  Laterality: N/A;   RIGHT/LEFT HEART CATH AND CORONARY ANGIOGRAPHY Bilateral 08/19/2023   Procedure: RIGHT/LEFT HEART CATH AND CORONARY ANGIOGRAPHY;  Surgeon: Sammy Crisp, MD;  Location: [facility] INV[facility] CV [facility];  Service: Cardiovascular;  Laterality: Bilateral;   SHOULDER ARTHROSCOPY WITH SUBACROMIAL DECOMPRESSION, ROTATOR CUFF REPAIR AND BICEP TENDON REPAIR Left 09/16/2023   Procedure: SHOULDER ARTHROSCOPY WITH DEBRIDEMENT, DECOMPRESSION, ROTATOR CUFF REPAIR AND BICEPS TENODESIS.;  Surgeon: Elner Hahn, MD;  Location: [facility];  Service:  Orthopedics;  Laterality: Left;   SUBQ ICD IMPLANT N/A 05/20/2022   Procedure: SUBQ ICD IMPLANT;  Surgeon: Boyce Byes, MD;  Location: [facility] INVASIVE CV LAB;  Service: Cardiovascular;  Laterality: N/A;    FAMILY HISTORY: Family History  Problem Relation Age of Onset   Diabetes Mother    Diabetes Mellitus II Mother    Hypothyroidism Mother    Hypertension Mother    Kidney failure Mother        Dialysis   Heart attack Mother        38 yo approximately   Hypertension Father    Gout Father    Cancer Maternal Grandfather    Diabetes Maternal Grandfather    Cancer Paternal Aunt     ADVANCED DIRECTIVES (Y/N):  N  HEALTH MAINTENANCE: Social History   Tobacco Use   Smoking status: Former    Current packs/day: 0.00    Average packs/day: 0.5 packs/day for 33.0 years (16.5 ttl pk-yrs)    Types: Cigarettes    Start date: 05/09/1987    Quit date: 05/08/2020    Years since quitting: 3.7   Smokeless tobacco: Former    Quit date: 05/08/2020   Tobacco comments:    Quit Sept 2021  Vaping Use   Vaping status: Former  Substance Use Topics   Alcohol use: Not Currently   Drug use: No     Colonoscopy:  PAP:  Bone density:  Lipid panel:  Allergies  Allergen Reactions   Metformin  And Related Nausea And Vomiting   Zantac  [Ranitidine  Hcl] Diarrhea and Nausea Only    Night sweats    Current Outpatient Medications  Medication Sig Dispense Refill   Accu-Chek Softclix Lancets lancets USE UP TO FOUR TIMES DAILY     albuterol  (VENTOLIN  HFA) 108 (90 Base) MCG/ACT inhaler Inhale 2 puffs into the lungs [name] 4 (four) hours as needed for wheezing or shortness of breath. 8 g 6   allopurinol  (ZYLOPRIM ) 300 MG tablet Take 300 mg by mouth [name] morning.     amiodarone  (PACERONE ) 200 MG tablet Take 1 tablet (200 mg total) by mouth daily. 90 tablet 3   aspirin  EC 81 MG tablet Take 81 mg by mouth [name] morning.     azelastine  (ASTELIN ) 0.1 % nasal spray Place 2 sprays into both nostrils 2 (two)  times daily. Use in each nostril as directed 30 mL 12   BD PEN NEEDLE NANO 2ND GEN 32G X 4 MM MISC USE  AS DIRECTED AT BEDTIME 200 each 0   blood glucose meter kit and supplies KIT Dispense based on patient and insurance preference. Use up to four times daily as directed. 1 each 11   buPROPion  (WELLBUTRIN  XL) 150 MG 24 hr tablet Take 150 mg by mouth daily.     butalbital -acetaminophen -caffeine  (FIORICET ) 50-325-40 MG tablet Take 1 tablet by  mouth [name] 4 (four) hours as needed for migraine.     carvedilol  (COREG ) 6.25 MG tablet Take 1 tablet (6.25 mg total) by mouth 2 (two) times daily. 60 tablet 11   cetirizine  (ZYRTEC ) 10 MG tablet Take 1 tablet (10 mg total) by mouth [name] morning.     colchicine  0.6 MG tablet Take 1 tablet (0.6 mg total) by mouth daily as needed (gout flare).     Continuous Glucose Sensor (FREESTYLE LIBRE 3 SENSOR) MISC Place 1 sensor on the skin [name] 14 days. Use to check glucose continuously 2 each 11   dapagliflozin  propanediol (FARXIGA ) 10 MG TABS tablet Take 1 tablet (10 mg total) by mouth daily. 90 tablet 3   fenofibrate  (TRICOR ) 145 MG tablet Take 1 tablet (145 mg total) by mouth [name] evening.     fluticasone  (FLONASE ) 50 MCG/ACT nasal spray Place 2 sprays into both nostrils daily as needed for allergies or rhinitis. Home med.     Galcanezumab-gnlm 120 MG/ML SOAJ Inject 120 mg into the skin. Monthly     Insulin  Glargine (BASAGLAR  KWIKPEN) 100 UNIT/ML Inject 36 Units into the skin at bedtime. 45 mL 3   insulin  lispro (HUMALOG ) 100 UNIT/ML injection Inject 10 Units into the skin 3 (three) times daily before meals.     Insulin  Pen Needle 32G X 4 MM MISC 1 each by Does not apply route in the morning, at noon, in the evening, and at bedtime. 120 each 11   ipratropium-albuterol  (DUONEB) 0.5-2.5 (3) MG/3ML SOLN Take 3 mLs by nebulization [name] 6 (six) hours as needed. 360 mL 11   meclizine  (ANTIVERT ) 25 MG tablet Take 0.5 tablets (12.5 mg total) by mouth 3 (three) times daily  as needed for dizziness.     nitroGLYCERIN  (NITROSTAT ) 0.4 MG SL tablet Place 1 tablet (0.4 mg total) under the tongue [name] 5 (five) minutes x 3 doses as needed for chest pain. 30 tablet 0   omeprazole  (PRILOSEC) 40 MG capsule Take 1 capsule (40 mg total) by mouth [name] morning.     potassium chloride  SA (KLOR-CON  M) 20 MEQ tablet Take 1 tablet (20 mEq total) by mouth [name] other day. AM     Resmetirom  (REZDIFFRA ) 100 MG TABS TAKE 1 TABLET BY MOUTH DAILY WITH OR WITHOUT FOOD 30 tablet 1   rivaroxaban  (XARELTO ) 20 MG TABS tablet Take 1 tablet (20 mg total) by mouth daily with supper. 90 tablet 3   rosuvastatin  (CRESTOR ) 20 MG tablet Take 1 tablet (20 mg total) by mouth at bedtime.     sacubitril -valsartan  (ENTRESTO ) 24-26 MG Take 1 tablet by mouth 2 (two) times daily. 60 tablet 2   Semaglutide , 2 MG/DOSE, (OZEMPIC , 2 MG/DOSE,) 8 MG/3ML SOPN Inject 2 mg into the skin once a week. Tuesdays 3 mL 2   spironolactone  (ALDACTONE ) 25 MG tablet Take 1 tablet by mouth once daily 90 tablet 1   TALTZ 80 MG/ML SOAJ Inject 3 mLs into the skin [name] 28 (twenty-eight) days.     torsemide  (DEMADEX ) 20 MG tablet Take 1 tablet (20 mg total) by mouth [name] morning. (Patient taking differently: Take 40 mg by mouth [name] morning.)     umeclidinium-vilanterol (ANORO ELLIPTA ) 62.5-25 MCG/ACT AEPB Inhale 1 puff into the lungs [name] morning.     No current facility-administered medications for this visit.    OBJECTIVE: Vitals:   01/27/24 1327  BP: 128/80  Pulse: 81  Resp: 20  Temp: (!) 97.1 F (36.2 C)  SpO2: 98%  Body mass index is 34.37 kg/m.    ECOG FS:0 - Asymptomatic  General: Well-developed, well-nourished, no acute distress. Eyes: Pink conjunctiva, anicteric sclera. HEENT: Normocephalic, moist mucous membranes. Lungs: No audible wheezing or coughing. Heart: Regular rate and rhythm. Abdomen: Soft, nontender, no obvious distention. Musculoskeletal: No edema, cyanosis, or clubbing. Neuro: Alert,  answering all questions appropriately. Cranial nerves grossly intact. Skin: No rashes or petechiae noted. Psych: Normal affect.  LAB RESULTS:  Lab Results  Component Value Date   NA 142 01/21/2024   K 4.3 01/21/2024   CL 98 01/21/2024   CO2 23 01/21/2024   GLUCOSE 171 (H) 01/21/2024   BUN 30 (H) 01/21/2024   CREATININE 1.74 (H) 01/21/2024   CALCIUM  10.5 (H) 01/21/2024   PROT 8.0 01/01/2024   ALBUMIN  4.6 01/01/2024   AST 19 01/01/2024   ALT 28 01/01/2024   ALKPHOS 88 01/01/2024   BILITOT 1.1 01/01/2024   GFRNONAA >60 12/12/2023   GFRAA >60 05/14/2020    Lab Results  Component Value Date   WBC 8.5 01/27/2024   NEUTROABS 5.3 01/27/2024   HGB 16.3 01/27/2024   HCT 50.7 01/27/2024   MCV 83.3 01/27/2024   PLT 189 01/27/2024     STUDIES: LONG TERM MONITOR (3-14 DAYS) Result Date: 01/12/2024 Patch Wear Time:  6 days and 21 hours (2025-04-23T09:07:01-399 to 2025-04-30T07:00:53-0400) 1. Sinus rhythm - avg HR of 76 bpm. 2. Five runs of nonsustained Ventricular Tachycardia occurred, the run with the fastest interval lasting 4 beats with a max rate of 182 bpm, the longest lasting 5 beats with an avg rate of 119 bpm. 3. Rare PACs and PVCs. 4. Two patient-triggered events associated with sinus rhythm Jules Oar, MD 8:07 PM  ECHOCARDIOGRAM COMPLETE Result Date: 01/02/2024    ECHOCARDIOGRAM REPORT   Patient Name:   Gabriel [name]. Date of Exam: 01/02/2024 Medical Rec #:  [id number]           Height:       71.0 in Accession #:    [id number]          Weight:       238.0 lb Date of Birth:  12-20-1970           BSA:          2.270 m Patient Age:    52 years            BP:           135/82 mmHg Patient Gender: M                   HR:           82 bpm. Exam Location:  [place] Procedure: 2D Echo, Cardiac Doppler and Color Doppler (Both Spectral and Color            Flow Doppler were utilized during procedure). Indications:    I50.22 Chronic systolic (congestive) heart failure; I48.1                  Persistent atrial fibrillation  History:        Patient has prior history of Echocardiogram examinations, most                 recent 11/11/2023. Arrythmias:Atrial Fibrillation,                 Signs/Symptoms:Chest Pain and Dyspnea; Risk Factors:Dyslipidemia                 and Diabetes.  Sonographer:    Alicia Inoue Referring Phys: [id number] CAMERON T LAMBERT IMPRESSIONS  1. Left ventricular ejection fraction, by estimation, is 35 to 40%. Left ventricular ejection fraction by 2D MOD biplane is 35.0 %. Left ventricular ejection fraction by PLAX is 36 %. The left ventricle has moderately decreased function. The left ventricle demonstrates global hypokinesis. There is mild left ventricular hypertrophy. Left ventricular diastolic function could not be evaluated.  2. Right ventricular systolic function is low normal. The right ventricular size is normal.  3. Left atrial size was mildly dilated.  4. The mitral valve is normal in structure. No evidence of mitral valve regurgitation.  5. The aortic valve is tricuspid. Aortic valve regurgitation is mild to moderate.  6. Aortic dilatation noted. There is mild dilatation of the aortic root, measuring 40 mm.  7. The inferior vena cava is normal in size with greater than 50% respiratory variability, suggesting right atrial pressure of 3 mmHg. FINDINGS  Left Ventricle: Left ventricular ejection fraction, by estimation, is 35 to 40%. Left ventricular ejection fraction by PLAX is 36 %. Left ventricular ejection fraction by 2D MOD biplane is 35.0 %. The left ventricle has moderately decreased function. The left ventricle demonstrates global hypokinesis. The left ventricular internal cavity size was normal in size. There is mild left ventricular hypertrophy. Left ventricular diastolic function could not be evaluated. Right Ventricle: The right ventricular size is normal. No increase in right ventricular wall thickness. Right ventricular systolic function is low normal.  Left Atrium: Left atrial size was mildly dilated. Right Atrium: Right atrial size was normal in size. Pericardium: There is no evidence of pericardial effusion. Mitral Valve: The mitral valve is normal in structure. No evidence of mitral valve regurgitation. Tricuspid Valve: The tricuspid valve is normal in structure. Tricuspid valve regurgitation is not demonstrated. Aortic Valve: The aortic valve is tricuspid. Aortic valve regurgitation is mild to moderate. Aortic regurgitation PHT measures 397 msec. Aortic valve mean gradient measures 2.0 mmHg. Aortic valve peak gradient measures 4.2 mmHg. Aortic valve area, by VTI  measures 4.22 cm. Pulmonic Valve: The pulmonic valve was not well visualized. Pulmonic valve regurgitation is not visualized. Aorta: Aortic dilatation noted. There is mild dilatation of the aortic root, measuring 40 mm. Venous: The inferior vena cava is normal in size with greater than 50% respiratory variability, suggesting right atrial pressure of 3 mmHg. IAS/Shunts: No atrial level shunt detected by color flow Doppler.  LEFT VENTRICLE PLAX 2D                        Biplane EF (MOD) LV EF:         Left            LV Biplane EF:   Left                ventricular                      ventricular                ejection                         ejection                fraction by                      fraction by  PLAX is 36                       2D MOD                %.                               biplane is LVIDd:         6.20 cm                          35.0 %. LVIDs:         5.10 cm LV PW:         1.30 cm         Diastology LV IVS:        1.20 cm         LV e' medial:    4.13 cm/s LVOT diam:     2.80 cm         LV E/e' medial:  5.2 LV SV:         73              LV e' lateral:   5.44 cm/s LV SV Index:   32              LV E/e' lateral: 3.9 LVOT Area:     6.16 cm  LV Volumes (MOD) LV vol d, MOD    159.0 ml A2C: LV vol d, MOD    246.0 ml A4C: LV vol s, MOD    107.0 ml A2C: LV vol s,  MOD    155.0 ml A4C: LV SV MOD A2C:   52.0 ml LV SV MOD A4C:   246.0 ml LV SV MOD BP:    70.7 ml RIGHT VENTRICLE             IVC RV S prime:     11.70 cm/s  IVC diam: 1.40 cm TAPSE (M-mode): 1.1 cm LEFT ATRIUM              Index LA Vol (A2C):   126.0 ml 55.50 ml/m LA Vol (A4C):   77.9 ml  34.31 ml/m LA Biplane Vol: 102.0 ml 44.93 ml/m  AORTIC VALVE AV Area (Vmax):    3.84 cm AV Area (Vmean):   3.71 cm AV Area (VTI):     4.22 cm AV Vmax:           102.00 cm/s AV Vmean:          67.400 cm/s AV VTI:            0.172 m AV Peak Grad:      4.2 mmHg AV Mean Grad:      2.0 mmHg LVOT Vmax:         63.60 cm/s LVOT Vmean:        40.600 cm/s LVOT VTI:          0.118 m LVOT/AV VTI ratio: 0.69 AI PHT:            397 msec  AORTA Ao Sinus diam: 3.95 cm Ao Asc diam:   3.30 cm MITRAL VALVE MV Area (PHT): 3.79 cm    SHUNTS MV Decel Time: 200 msec    Systemic VTI:  0.12 m MV E velocity: 21.36 cm/s  Systemic Diam: 2.80 cm MV A velocity: 39.80 cm/s MV E/A ratio:  0.54 Constancia Delton MD Electronically signed by Constancia Delton MD Signature Date/Time: 01/02/2024/5:08:37 PM    Final     ASSESSMENT: Secondary polycythemia.  PLAN:    Secondary polycythemia: Previously, entire workup was negative for etiology including JAK2 with reflex and BCR-ABL mutations.  Possibly secondary to underlying COPD and/or heart disease.  Previously, we discussed a goal hematocrit of less than 50.0.  Today's hematocrit is 50.7, but patient does not require phlebotomy at this time.  Return to clinic in 4 months with repeat laboratory, further evaluation, and consideration of additional phlebotomy if necessary.  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 Rotator cuff repair: Patient reports his pain is significantly improved.  Continue follow-up with orthopedics as scheduled. Cyst: Patient has surgery scheduled for tomorrow.  Patient expressed understanding and was in agreement with this plan. He also understands that He can call clinic  at any time with any questions, concerns, or complaints.    Shellie Dials, MD   01/27/2024 2:23 PM

## 2024-01-27 NOTE — Telephone Encounter (Signed)
 Will forward back to preop for medical clearance (review of Xarelto )

## 2024-01-27 NOTE — Progress Notes (Deleted)
 Perioperative / Anesthesia Services  Pre-Admission Testing Clinical Review / Pre-Operative Anesthesia Consult  Date: 01/27/24  PATIENT DEMOGRAPHICS: Name: Cleave Ternes. DOB: [birth year] MRN:   [id number]  Note: Available PAT nursing documentation and vital signs have been reviewed. Clinical nursing staff has updated patient's PMH/PSHx, current medication list, and drug allergies/intolerances to ensure complete and comprehensive history available to assist care teams in MDM as it pertains to the aforementioned surgical procedure and anticipated anesthetic course. Extensive review of available clinical information personally performed. [place] PMH and PSHx updated with any diagnoses/procedures that  may have been inadvertently omitted during his intake with the pre-admission testing department's nursing staff.  PLANNED SURGICAL PROCEDURE(S):    Case: [id number] Date: 01/28/24   Procedure: EXCISION, LESION, SCALP (Scalp)   Anesthesia type: General   Pre-op  diagnosis: R22.0 Scalp mass   Location: [facility] ORS FOR ANESTHESIA GROUP   Surgeons: Rodolph Romano, MD     CLINICAL DISCUSSION: Julyan Gales. is a 53 y.o. male who is submitted for pre-surgical anesthesia review and clearance prior to him undergoing the above procedure. al anesthesia review and clearance prior to him undergoing the above procedure. Patient is a Former Smoker (16.5 pack years; quit 04/2020). 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   Patient is followed by cardiology (Gollan, MD). He was last seen in the cardiology clinic  on 01/21/2024; notes reviewed. At the time of his clinic visit, patient doing well overall from a cardiovascular perspective. Patient denied any chest pain, shortness of breath, PND, orthopnea, palpitations, significant peripheral edema, weakness, fatigue, vertiginous symptoms, or presyncope/syncope. Patient with a past medical history significant for cardiovascular diagnoses. Documented physical exam was grossly benign, providing no evidence of acute exacerbation and/or decompensation of the patient's known cardiovascular conditions.  Patient suffered an NSTEMI in May 2018.  Diagnostic LEFT heart catheterization on 01/06/2017 revealed a severely reduced left ventricular systolic function with an EF of 25-35%.  There was global hypokinesis.  LVEDP documented in the high 20s.  Coronary artery disease noted with minor luminal irregularities and the LAD, diagonal, LCx, and OM.  There was 40% stenosis noted of the proximal to distal RCA.  Given the nonobstructive nature of his coronary artery disease, the decision was made to defer intervention opting for medical management.   Patient with a history of a mixed ischemic and nonischemic cardiomyopathy resulting in HFrEF and pulmonary hypertension.  Cardiac function has been serially monitored via various modalities since time of his diagnosis.     Diagnostic RIGHT/LEFT heart catheterization was performed on 06/13/2020 revealing multivessel CAD; 50% distal LAD, 30% proximal to mid LCx, 40% OM1, and 100% proximal RCA.  RCA with left-to-right collateral formation.  Hemodynamics: LVEDP = 20 mmHg, mean RA = 9 mmHg, mean PA = 40 mmHg, mean PCWP = 17 mmHg, AO saturation = 98%, PA saturation = 61%.  CO = 3.1 L/min, CI = 1.3 L/min/m, and PVR 7.4 Wood units.   Patient underwent placement of a Interior and spatial designer MRI subcutaneous AICD on 05/20/2022.  Device is regularly interrogated by patient's primary electrophysiology team.  Most recent interrogation was on  01/13/2024, at which time device was functioning properly.    Patient underwent MRI imaging of brain on 06/23/2023 that  revealed a tiny chronic RIGHT cerebellar infarct.  Timing of neurological event uncertain.  Patient has no resulting neurological deficits.  Repeat diagnostic RIGHT/LEFT heart catheterization was performed on 08/19/2023 revealing multivessel CAD; 50% distal LAD, 30% proximal-mid LCx, 40% OM1, chronic total occlusion of the proximal RCA with left-to-right collateral formation, and 50% proximal and mid RCA.  Coronary arteries relatively stable overall.  Given the chronic nature of the RCA occlusion, no intervention was deemed necessary.  Hemodynamics: LVEDP = 20 mmHg, mean RA = 5 mmHg, mean PA = 41 mmHg, mean PCWP = 18 mmHg, AO saturation, 92%, PA saturation = 59%.  CO = 4.3 L/min, CI = 1.9 L/min/m, and PVR 5.3 Wood units.  Function cardiac event monitor study performed on 12/30/2023 revealed a predominant underlying sinus rhythm with rate 76 bpm.  There were 5 runs of NSVT with the fastest lasting 4 beats at a maximum rate of 182 bpm, and the longest lasting 5 beats an average rate of 119 bpm.  Regular atrial and ventricular ectopy noted.  Most recent TTE performed on 01/02/2024 revealed a moderately reduced left ventricular systolic function with an EF of 35-40%. There was mild LVH.  There was global left ventricular hypokinesis.  Right ventricular size and function normal with a TAPSE measuring 1.1 cm  (normal range >/= 1.6 cm).  Left atrium was mildly dilated.  There was mild to moderate aortic regurgitation.  All transvalvular gradients were noted to be normal providing no evidence suggestive of valvular stenosis.  Aortic root was mildly dilated measuring up to 40 mm.  Patient with an atrial fibrillation diagnosis; CHA2DS2-VASc Score = 6 (HFrEF, HTN, CVA x 2, vascular disease, T2DM ). He underwent a DCCV procedure on 11/06/2023, at which time he received a single 200 J synchronized  cardioversion that restored NSR. Rate and rhythm are currently being maintained on oral amiodarone  + carvedilol . He is chronically anticoagulated using rivaroxaban ; reported to be compliant with therapy with no evidence or reports of GI/GU bleeding.  Ischemic/nonischemic cardiomyopathy with resulting HFrEF managed using beta-blocker (carvedilol ), and diuretic (torsemide  + spironolactone ), and ARB/ANRi (Entresto ) therapies.  Blood pressure documented at 124/80 mmHg.  Patient has a supply of short acting nitrates (NTG) to use on a as needed basis for recurrent angina/anginal equivalent symptoms; denied recent use.  Patient is on fenofibrate  + rosuvastatin  for his HLD diagnosis/ and ASCVD prevention. T2DM well controlled on currently prescribed regimen; last HgbA1c was 6.9% when checked on 01/01/2024.  In the setting of known cardiovascular diagnoses and concurrent T2DM, patient is on an SGLT2i (dapagliflozin ) for added cardiovascular and renovascular protection. He does have an OSAH diagnosis and is reported to be compliant with prescribed nocturnal PAP therapy. Patient is able to complete all of his ADL/IADLs without cardiovascular limitation.  Per the DASI, patient is able to achieve at least 4 METS of physical activity without experiencing any significant degree of angina/anginal equivalent symptoms. No changes were made to his medication regimen during his visit with cardiology. Patient scheduled to follow-up with outpatient cardiology in 4 months or sooner if needed.   [name] is scheduled for an elective EXCISION, LESION, SCALP (Scalp) on 01/28/2024 with Dr. Lucas Sjogren, MD.  Given patient's past medical history significant for cardiovascular diagnoses, presurgical cardiac clearance was sought by the PAT team. Per cardiology, based ACC/AHA guidelines, the patient's past medical history, and the amount of time since his last clinic visit, this patient would be at an overall ACCEPTABLE risk  for the  planned procedure without further cardiovascular testing or intervention at this time.   Again, this patient is on daily antithrombotic and anticoagulation therapies.  He has been instructed on recommendations for holding his daily low-dose ASA for 5 days (last dose 01/22/2024)  and his rivaroxaban  for 3 days (last dose 01/24/2024) prior to his procedure with plans to restart as soon as postoperatively respectively minimized by his primary attending surgeon.  Patient denies previous perioperative complications with anesthesia in the past. In review his EMR, it is noted that patient underwent a general anesthetic course here at [facility] ([facility]) in 10/2023 without documented complications.   MOST RECENT VITAL SIGNS:    01/27/2024    1:27 PM 01/21/2024   10:07 AM 01/01/2024    8:25 AM  Vitals with BMI  Height 5' 11  5' 11  Weight 246 lbs 6 oz 238 lbs 3 oz 238 lbs  BMI 34.38 33.24 33.21  Systolic 128 124 864  Diastolic 80 80 82  Pulse 81 89 80   PROVIDERS/SPECIALISTS: NOTE: Primary physician provider listed below. Patient may have been seen by APP or partner within same practice.   PROVIDER ROLE / SPECIALTY LAST SHERLEAN Rodolph Romano, MD  General Surgery (Surgeon) 01/20/2024  [name] LABOR, FNP Primary Care Provider 01/01/2024  Perla Lye, MD Cardiology 08/29/2023; preop APP call 01/21/2024  Cherrie Sieving , MD   Advanced Heart Failure 01/21/2024  Cindie Smalls, MD  Electrophysiology 12/24/2023  Jacobo Lye, MD Hematology 01/27/2024   ALLERGIES: Allergies  Allergen Reactions   Metformin  And Related Nausea And Vomiting   Zantac  [Ranitidine  Hcl] Diarrhea and Nausea Only    Night sweats   CURRENT HOME MEDICATIONS: No current facility-administered medications for this encounter.    albuterol  (VENTOLIN  HFA) 108 (90 Base) MCG/ACT inhaler   allopurinol  (ZYLOPRIM ) 300 MG tablet   amiodarone  (PACERONE ) 200 MG tablet    aspirin  EC 81 MG tablet   azelastine  (ASTELIN ) 0.1 % nasal spray   buPROPion  (WELLBUTRIN  XL) 150 MG 24 hr tablet   butalbital -acetaminophen -caffeine  (FIORICET ) 50-325-40 MG tablet   carvedilol  (COREG ) 6.25 MG tablet   cetirizine  (ZYRTEC ) 10 MG tablet   colchicine  0.6 MG tablet   dapagliflozin  propanediol (FARXIGA ) 10 MG TABS tablet   fenofibrate  (TRICOR ) 145 MG tablet   fluticasone  (FLONASE ) 50 MCG/ACT nasal spray   Galcanezumab-gnlm 120 MG/ML SOAJ   Insulin  Glargine (BASAGLAR  KWIKPEN) 100 UNIT/ML   insulin  lispro (HUMALOG ) 100 UNIT/ML injection   ipratropium-albuterol  (DUONEB) 0.5-2.5 (3) MG/3ML SOLN   meclizine  (ANTIVERT ) 25 MG tablet   nitroGLYCERIN  (NITROSTAT ) 0.4 MG SL tablet   omeprazole  (PRILOSEC) 40 MG capsule   potassium chloride  SA (KLOR-CON  M) 20 MEQ tablet   Resmetirom  (REZDIFFRA ) 100 MG TABS   rivaroxaban  (XARELTO ) 20 MG TABS tablet   rosuvastatin  (CRESTOR ) 20 MG tablet   sacubitril -valsartan  (ENTRESTO ) 24-26 MG   Semaglutide , 2 MG/DOSE, (OZEMPIC , 2 MG/DOSE,) 8 MG/3ML SOPN   spironolactone  (ALDACTONE ) 25 MG tablet   TALTZ 80 MG/ML SOAJ   torsemide  (DEMADEX ) 20 MG tablet   umeclidinium-vilanterol (ANORO ELLIPTA ) 62.5-25 MCG/ACT AEPB   Accu-Chek Softclix Lancets lancets   BD PEN NEEDLE NANO 2ND GEN 32G X 4 MM MISC   blood glucose meter kit and supplies KIT   Continuous Glucose Sensor (FREESTYLE LIBRE 3 SENSOR) MISC   Insulin  Pen Needle 32G X 4 MM MISC   HISTORY: Past Medical History:  Diagnosis Date   Anginal pain (HCC)    Aortic atherosclerosis (  HCC)    Aortic root dilation (HCC) 11/28/2016   a.) TTE 11/28/2016: 43 mm; b.) TTE 11/23/2019: 38 mm; c.) TTE 08/14/2022: 40 mm; d.) TTE 03/08/2023: 43 mm; e.) TTE 01/02/2024: 40 mm   Arteriosclerosis of right carotid artery    a.) carotid doppler 04/29/2017: <50% RICA   Atrial fibrillation with RVR (HCC)    a.) CHA2DS2VASc = 6 (HFrEF, HTN, CVA x 2, vascular disease, T2DM) as of 01/27/2024; b.) s/p DCCV 11/06/2023 -->  200 J x 1 -->  NSR; c.) rate/rhythm maintained on oral amiodarone  + carvedilol ; chronically anticoagulated with rivaroxaban    CAD (coronary artery disease)    a.) MV 04/2015: low risk; b.) LHC 01/06/2017: minor irregs in LAD/Diag/LCX/OM, RCA 40p/m/d; c.) R/LHC 06/13/2020: 50% dLAD, 30% p-mLCx, 40% OM1, 100% pRCA (L-R collats) - med mgmt; d.) [facility] 08/19/2023: 50% dLAD, 30% p-mLCx, 40% OM1, 100% pRCA (L-R collats), 50% p-mRCA - med mgmt   Cardiomegaly    Cerebellar infarct (HCC)    a.) brain MRI 06/23/2023: tiny chronic infarct within the RIGHT cerebellar hemisphere; unsure of timing of neurological event; no deficits   Chest wall pain, chronic    Chronic 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    Chronic Troponin Elevation    CKD (chronic kidney disease), stage II-III    COPD (chronic obstructive pulmonary disease) (HCC)    Depression    DM (diabetes mellitus), type 2 (HCC)    Dyspnea    GERD (gastroesophageal reflux disease)    Headache    HFrEF (heart failure with reduced ejection fraction) (HCC)    History of 2019 novel coronavirus disease (COVID-19) 03/01/2022   History of kidney stones    History of methicillin resistant staphylococcus aureus (MRSA)    Hyperlipidemia    Hypertension    ICD (implantable cardioverter-defibrillator) in place 05/20/2022   a.) LEFT midaxillary [employer] Emblem MRI S-ICD (SN: [id number])   Left rotator cuff tear    Liver cirrhosis secondary to NASH (HCC)    Long term current use of immunosuppressive drug    a.) ixekizumab for psoriasis/psoriatic arthritis diagnosis   Long-term use of aspirin  therapy    Mediastinal lymphadenopathy    Mitral regurgitation    Mild to moderate by October 2021 echocardiogram.   Mixed Ischemic & NICM (nonischemic cardiomyopathy) (HCC)    a.) s/p AICD placement   NASH (nonalcoholic steatohepatitis)    NSTEMI (non-ST elevated myocardial infarction) (HCC) 12/2016   a.) LHC: 01/06/2017: minor luminal irregs LAD/diag/LCx/OM, 40%  dRCA, 40% p-mRCA --> med mgmt   NSVT (nonsustained ventricular tachycardia) (HCC)    a. 12/2015 noted on tele-->amio;  b. 12/2015 Event monitor: no VT noted.   Obesity (BMI 30.0-34.9)    On rivaroxaban  therapy    OSA treated with BiPAP    Polyp of descending colon    Psoriasis    Psoriatic arthritis (HCC)    a.) Tx'd with ixekizumab   Pulmonary HTN (HCC) 02/29/2016   a.) TTE 02/29/2016: RVSP 47; b.) TTE 06/26/2019: RVSP 58.4; c.) TTE 12/08/2019: RVSP 40.8; d.) TTE 06/08/2020: RVSP 44.7; e.) R/LHC 06/08/2020: mRA 9, mPA 40, mPWCP 17, AO sat 98, PA sat 61, CO 3.1, CI 1.3, PVR 7.4, LVEDP 20; f.) R/LHC 08/18/2020: mRA 5, mPA 41, mPCWP 18, AO sat 92, PA sat 59, CO 4.3, CI 1.9, PVR 5.3   Recurrent pulmonary emboli (HCC) 06/07/2020   06/07/20: small bilateral PEs.  12/31/19: RUL and RLL PEs.   Secondary polycythemia    a.) negative for JAK2  with reflex and BCR-ABL mutations; seeing hematology with etiology felt to be secondary to underlying COPD (quit smoking 04/2020), heart disease, OSAH, and obesity   Syncope    a. 01/2016 - felt to be vasovagal.   Thrombocytopenia (HCC)    Past Surgical History:  Procedure Laterality Date   CARDIOVERSION N/A 11/06/2023   Procedure: CARDIOVERSION;  Surgeon: Zenaida Morene PARAS, MD;  Location: [facility] ORS;  Service: Cardiovascular;  Laterality: N/A;   cardioverted 11/06/23     COLONOSCOPY WITH PROPOFOL  N/A 08/01/2022   Procedure: COLONOSCOPY WITH PROPOFOL ;  Surgeon: Unk Corinn Skiff, MD;  Location: [facility] ENDOSCOPY;  Service: Gastroenterology;  Laterality: N/A;   ESOPHAGOGASTRODUODENOSCOPY (EGD) WITH PROPOFOL  N/A 08/01/2022   Procedure: ESOPHAGOGASTRODUODENOSCOPY (EGD) WITH PROPOFOL ;  Surgeon: Unk Corinn Skiff, MD;  Location: [facility] ENDOSCOPY;  Service: Gastroenterology;  Laterality: N/A;   ESOPHAGOGASTRODUODENOSCOPY (EGD) WITH PROPOFOL  N/A 10/16/2022   Procedure: ESOPHAGOGASTRODUODENOSCOPY (EGD) WITH PROPOFOL ;  Surgeon: Unk Corinn Skiff, MD;  Location: [facility]  ENDOSCOPY;  Service: Gastroenterology;  Laterality: N/A;   FINGER AMPUTATION Left    Traumatic   LEFT HEART CATH AND CORONARY ANGIOGRAPHY N/A 01/06/2017   Procedure: Left Heart Cath and Coronary Angiography;  Surgeon: Darron Deatrice LABOR, MD;  Location: [facility] INVASIVE CV LAB;  Service: Cardiovascular;  Laterality: N/A;   RIGHT/LEFT HEART CATH AND CORONARY ANGIOGRAPHY N/A 06/13/2020   Procedure: RIGHT/LEFT HEART CATH AND CORONARY ANGIOGRAPHY;  Surgeon: Mady Bruckner, MD;  Location: [facility] INVASIVE CV LAB;  Service: Cardiovascular;  Laterality: N/A;   RIGHT/LEFT HEART CATH AND CORONARY ANGIOGRAPHY Bilateral 08/19/2023   Procedure: RIGHT/LEFT HEART CATH AND CORONARY ANGIOGRAPHY;  Surgeon: Mady Bruckner, MD;  Location: [facility] INVASIVE CV LAB;  Service: Cardiovascular;  Laterality: Bilateral;   SHOULDER ARTHROSCOPY WITH SUBACROMIAL DECOMPRESSION, ROTATOR CUFF REPAIR AND BICEP TENDON REPAIR Left 09/16/2023   Procedure: SHOULDER ARTHROSCOPY WITH DEBRIDEMENT, DECOMPRESSION, ROTATOR CUFF REPAIR AND BICEPS TENODESIS.;  Surgeon: [name] Norleen PARAS, MD;  Location: [facility] ORS;  Service: Orthopedics;  Laterality: Left;   SUBQ ICD IMPLANT N/A 05/20/2022   Procedure: SUBQ ICD IMPLANT;  Surgeon: Cindie Ole DASEN, MD;  Location: [facility] INVASIVE CV LAB;  Service: Cardiovascular;  Laterality: N/A;   Family History  Problem Relation Age of Onset   Diabetes Mother    Diabetes Mellitus II Mother    Hypothyroidism Mother    Hypertension Mother    Kidney failure Mother        Dialysis   Heart attack Mother        13 yo approximately   Hypertension Father    Gout Father    Cancer Maternal Grandfather    Diabetes Maternal Grandfather    Cancer Paternal Aunt    Social History   Tobacco Use   Smoking status: Former    Current packs/day: 0.00    Average packs/day: 0.5 packs/day for 33.0 years (16.5 ttl pk-yrs)    Types: Cigarettes    Start date: 05/09/1987    Quit date: 05/08/2020    Years since quitting: 3.7    Smokeless tobacco: Former    Quit date: 05/08/2020   Tobacco comments:    Quit Sept 2021  Substance Use Topics   Alcohol use: Not Currently   LABS:  Lab Results  Component Value Date   WBC 8.5 01/27/2024   HGB 16.3 01/27/2024   HCT 50.7 01/27/2024   MCV 83.3 01/27/2024   PLT 189 01/27/2024   Lab Results  Component Value Date   NA 142 01/21/2024   CL 98 01/21/2024  K 4.3 01/21/2024   CO2 23 01/21/2024   BUN 30 (H) 01/21/2024   CREATININE 1.74 (H) 01/21/2024   EGFR 46 (L) 01/21/2024   CALCIUM  10.5 (H) 01/21/2024   PHOS 4.4 11/06/2023   ALBUMIN  4.6 01/01/2024   GLUCOSE 171 (H) 01/21/2024    ECG: Date: 12/24/2023  Time ECG obtained: 1138 AM Rate: 81 bpm Rhythm: normal sinus Axis (leads I and aVF): normal Intervals: PR 174 ms. QRS 102 ms. QTc 494 ms. ST segment and T wave changes: No evidence of acute T wave abnormalities or significant ST segment elevation or depression.  Evidence of a possible, age undetermined, prior infarct:  No Comparison: Similar to previous tracing obtained on 12/12/2023   IMAGING / PROCEDURES: TRANSTHORACIC ECHOCARDIOGRAM performed on 01/02/2024 Left ventricular ejection fraction, by estimation, is 35 to 40%. Left ventricular ejection fraction by 2D MOD biplane is 35%. Left ventricular ejection fraction by PLAX is 36%. The left ventricle has moderately decreased function. The left ventricle demonstrates global hypokinesis. There is mild left ventricular hypertrophy. Left ventricular diastolic function could not be evaluated.  Right ventricular systolic function is low normal. The right ventricular size is normal.  Left atrial size was mildly dilated.  The mitral valve is normal in structure. No evidence of mitral valve regurgitation.  The aortic valve is tricuspid. Aortic valve regurgitation is mild to moderate.  Aortic dilatation noted. There is mild dilatation of the aortic root, measuring 40 mm.  The inferior vena cava is normal in size with  greater than 50% respiratory variability, suggesting right atrial pressure of 3 mmHg.   LONG TERM CARDIAC EVENT MONITOR STUDY performed on 12/30/2023 Patch Wear Time:  6 days and 21 hours (2025-04-23T09:07:01-399 to 2025-04-30T07:00:53-0400) Sinus rhythm - avg HR of 76 bpm. Five runs of nonsustained Ventricular Tachycardia occurred, the run with the fastest interval lasting 4 beats with a max rate of 182 bpm, the longest lasting 5 beats with an avg rate of 119 bpm.  Rare PACs and PVCs. Two patient-triggered events associated with sinus rhythm    CT ANGIO CHEST PE W/CM &/OR WO CM performed on 11/03/2023 No evidence of pulmonary embolus. Significant left shoulder effusion with bony destruction along the superolateral margin of the left humeral head. Please correlate with any history of recent trauma, otherwise septic arthritis and osteomyelitis should be considered. Cardiomegaly without pericardial effusion. Aortic atherosclerosis  Coronary artery atherosclerosis.   RIGHT/LEFT HEART CATHETERIZATION AND CORONARY ANGIOGRAPHY performed on 08/19/2023 Relatively stable appearance of the coronary arteries since last catheterization in 2021.  Chronically occluded proximal RCA now has antegrade flow, though I suspect this represents bridging collaterals.  Left-to-right collateral remain as well as moderate LAD and LCx disease. Mildly elevated left heart filling pressures (LVEDP 20 mmHg, PCWP 18 mmHg). Moderate pulmonary hypertension (mean PA 41 mmHg, PVR 5.3 WU). Moderately reduced Fick cardiac output/index (CO 4.3 L/min, CI 1.9 L/min/m^2).   MR BRAIN/IAC W WO CONTRAST performed on 06/23/2023 No evidence of an acute intracranial abnormality. No cerebellopontine angle or internal auditory canal mass. 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 Mild generalized cerebral volume loss. Incidentally noted 8 mm subcutaneous forehead lipoma.   PULMONARY FUNCTION TESTING performed on  10/17/2022     Latest Ref Rng & Units 10/17/2022    8:14 AM  PFT Results  FVC-Pre L 4.38   FVC-Predicted Pre % 85   FVC-Post L 4.50   FVC-Predicted Post % 87   Pre FEV1/FVC % % 83   Post FEV1/FCV % %  85   FEV1-Pre L 3.61   FEV1-Predicted Pre % 91   FEV1-Post L 3.83   DLCO uncorrected ml/min/mmHg 20.99   DLCO UNC% % 70   DLVA Predicted % 69   TLC L 6.58   TLC % Predicted % 91   RV % Predicted % 87      IMPRESSION AND PLAN: Jessup Ogas. has been referred for pre-anesthesia review and clearance prior to him undergoing the planned anesthetic and procedural courses. Available labs, pertinent testing, and imaging results were personally reviewed by me in preparation for upcoming operative/procedural course. [facility] medical record has been updated following extensive record review and patient interview with PAT staff.   This patient has been appropriately cleared by cardiology with an overall ACCEPTABLE risk of patient experiencing significant perioperative cardiovascular complications. Completed perioperative prescription for cardiac device management documentation completed by primary cardiology team and placed on patient's chart for review by the surgical/anesthetic team on the day of his procedure. Electrophysiology indicating that procedure may interfere with planned surgical procedure., and are therefore recommending magnet placement. Beyond normal perioperative cardiovascular monitoring, and the aforementioned magnet placement, there are no recommendations from electrophysiology team that prompt further discussion/recommendations from industry representative.   Based on clinical review performed today (01/27/24), barring any significant acute changes in the patient's overall condition, it is anticipated that he will be able to proceed with the planned surgical intervention. Any acute changes in clinical condition may necessitate his procedure being postponed and/or cancelled.  Patient will meet with anesthesia team (MD and/or CRNA) on the day of his procedure for preoperative evaluation/assessment. Questions regarding anesthetic course will be fielded at that time.   Pre-surgical instructions were reviewed with the patient during his PAT appointment, and questions were fielded to satisfaction by PAT clinical staff. He has been instructed on which medications that he will need to hold prior to surgery, as well as the ones that have been deemed safe/appropriate to take on the day of his procedure. As part of the general education provided by PAT, patient made aware both verbally and in writing, that he would need to abstain from the use of any illegal substances during his perioperative course. He was advised that failure to follow the provided instructions could necessitate case cancellation or result in serious perioperative complications up to and including death. Patient encouraged to contact PAT and/or his surgeon's office to discuss any questions or concerns that may arise prior to surgery; verbalized understanding.   Dorise Pereyra, MSN, APRN, FNP-C, CEN [facility]  Perioperative Services Nurse Practitioner Phone: [phone] Fax: [phone] 01/27/24 5:05 PM  NOTE: This note has been prepared using Dragon dictation software. Despite my best ability to proofread, there is always the potential that unintentional transcriptional errors may still occur from this process.

## 2024-01-28 ENCOUNTER — Encounter: Admission: RE | Payer: Self-pay | Source: Ambulatory Visit

## 2024-01-28 ENCOUNTER — Ambulatory Visit: Admission: RE | Admit: 2024-01-28 | Source: Ambulatory Visit | Admitting: General Surgery

## 2024-01-28 HISTORY — DX: Other long term (current) drug therapy: Z79.899

## 2024-01-28 HISTORY — DX: Unspecified cirrhosis of liver: K74.60

## 2024-01-28 HISTORY — DX: Nonalcoholic steatohepatitis (NASH): K75.81

## 2024-01-28 HISTORY — DX: Cerebral infarction, unspecified: I63.9

## 2024-01-28 HISTORY — DX: Secondary polycythemia: D75.1

## 2024-01-28 HISTORY — DX: Atherosclerosis of aorta: I70.0

## 2024-01-28 HISTORY — DX: Other cirrhosis of liver: K74.69

## 2024-01-28 HISTORY — DX: Cardiomegaly: I51.7

## 2024-01-28 HISTORY — DX: Unspecified atrial fibrillation: I48.91

## 2024-01-28 SURGERY — EXCISION, LESION, SCALP
Anesthesia: General | Site: Scalp

## 2024-01-28 NOTE — Progress Notes (Signed)
 PERIOPERATIVE PRESCRIPTION FOR IMPLANTED CARDIAC DEVICE PROGRAMMING  Patient Information: Name:  Gabriel [name].  DOB:  [birth year]  MRN:  [id number]    Planned Procedure: EXCISION, LESION, SCALP   Surgeon:  Dr. Eldred Grego, MD  Requesting device clearance: Renate Caroline, FNP-C  Date of Procedure:  01/28/2024  Cautery will be used.   Please route documentation back me via [facility], or may fax report to [facility] PAT APP at [phone].  Device Information:  Clinic EP Physician:  Dr. Harvie Liner   Device Type:  Defibrillator Manufacturer and Phone #:  [employer]: [phone] Pacemaker Dependent?:  No. Date of Last Device Check:  01/13/2024  Normal Device Function?:  Yes.    Electrophysiologist's Recommendations:  Have magnet available. Provide continuous ECG monitoring when magnet is used or reprogramming is to be performed.  Procedure may interfere with device function.  Magnet should be placed over device during procedure.  Per Device Clinic Standing Orders, Glorianne Largo, RN  11:16 AM 01/28/2024

## 2024-01-29 ENCOUNTER — Telehealth: Payer: Self-pay

## 2024-01-29 NOTE — Telephone Encounter (Signed)
 Copied from CRM [id number]. Topic: Referral - Status >> Jan 29, 2024  8:31 AM Georgeann Kindred wrote: Reason for CRM: Janna Ostwalt referred patient to [facility] for an endocrinologist for diabetes. Anna Solum at [facility] would like for PA Ostwalt to communicate what is the issue with the patient's well controlled diabetes and what would she like for her to address. If no response is received within 2 weeks they are going to close and decline the consult. Provider is Lavanda Porter [phone].

## 2024-01-30 NOTE — Telephone Encounter (Signed)
 Pt has been working with pharmacy on his DM mgmt, which has been controlled. At this time I do not believe he needs a referral for endocrine.Will reassess at next visit on 03/03/2024

## 2024-02-06 ENCOUNTER — Observation Stay
Admission: EM | Admit: 2024-02-06 | Discharge: 2024-02-07 | Disposition: A | Discharge status: Home / Self Care | Attending: Emergency Medicine | Admitting: Emergency Medicine

## 2024-02-06 ENCOUNTER — Encounter: Payer: Self-pay | Admitting: Emergency Medicine

## 2024-02-06 ENCOUNTER — Other Ambulatory Visit: Payer: Self-pay

## 2024-02-06 ENCOUNTER — Other Ambulatory Visit
Admission: RE | Admit: 2024-02-06 | Discharge: 2024-02-06 | Disposition: A | Discharge status: Home / Self Care | Source: Home / Self Care | Attending: Internal Medicine | Admitting: Internal Medicine

## 2024-02-06 ENCOUNTER — Telehealth: Payer: Self-pay

## 2024-02-06 ENCOUNTER — Emergency Department

## 2024-02-06 DIAGNOSIS — G4730 Sleep apnea, unspecified: Secondary | ICD-10-CM | POA: Diagnosis not present

## 2024-02-06 DIAGNOSIS — N1831 Chronic kidney disease, stage 3a: Secondary | ICD-10-CM | POA: Diagnosis not present

## 2024-02-06 DIAGNOSIS — D45 Polycythemia vera: Secondary | ICD-10-CM | POA: Diagnosis not present

## 2024-02-06 DIAGNOSIS — I10 Essential (primary) hypertension: Secondary | ICD-10-CM | POA: Diagnosis not present

## 2024-02-06 DIAGNOSIS — I2510 Atherosclerotic heart disease of native coronary artery without angina pectoris: Secondary | ICD-10-CM | POA: Diagnosis not present

## 2024-02-06 DIAGNOSIS — I272 Pulmonary hypertension, unspecified: Secondary | ICD-10-CM | POA: Insufficient documentation

## 2024-02-06 DIAGNOSIS — G4733 Obstructive sleep apnea (adult) (pediatric): Secondary | ICD-10-CM

## 2024-02-06 DIAGNOSIS — I130 Hypertensive heart and chronic kidney disease with heart failure and stage 1 through stage 4 chronic kidney disease, or unspecified chronic kidney disease: Secondary | ICD-10-CM | POA: Insufficient documentation

## 2024-02-06 DIAGNOSIS — I25118 Atherosclerotic heart disease of native coronary artery with other forms of angina pectoris: Secondary | ICD-10-CM | POA: Diagnosis present

## 2024-02-06 DIAGNOSIS — Z6834 Body mass index (BMI) 34.0-34.9, adult: Secondary | ICD-10-CM | POA: Insufficient documentation

## 2024-02-06 DIAGNOSIS — I679 Cerebrovascular disease, unspecified: Secondary | ICD-10-CM | POA: Diagnosis not present

## 2024-02-06 DIAGNOSIS — K7460 Unspecified cirrhosis of liver: Secondary | ICD-10-CM | POA: Insufficient documentation

## 2024-02-06 DIAGNOSIS — I5022 Chronic systolic (congestive) heart failure: Secondary | ICD-10-CM

## 2024-02-06 DIAGNOSIS — R058 Other specified cough: Secondary | ICD-10-CM

## 2024-02-06 DIAGNOSIS — K7581 Nonalcoholic steatohepatitis (NASH): Secondary | ICD-10-CM | POA: Diagnosis not present

## 2024-02-06 DIAGNOSIS — J9601 Acute respiratory failure with hypoxia: Secondary | ICD-10-CM | POA: Diagnosis not present

## 2024-02-06 DIAGNOSIS — Z95810 Presence of automatic (implantable) cardiac defibrillator: Secondary | ICD-10-CM | POA: Diagnosis not present

## 2024-02-06 DIAGNOSIS — E66811 Obesity, class 1: Secondary | ICD-10-CM | POA: Insufficient documentation

## 2024-02-06 DIAGNOSIS — E785 Hyperlipidemia, unspecified: Secondary | ICD-10-CM | POA: Insufficient documentation

## 2024-02-06 DIAGNOSIS — K7469 Other cirrhosis of liver: Secondary | ICD-10-CM | POA: Insufficient documentation

## 2024-02-06 DIAGNOSIS — N183 Chronic kidney disease, stage 3 unspecified: Secondary | ICD-10-CM | POA: Insufficient documentation

## 2024-02-06 DIAGNOSIS — I509 Heart failure, unspecified: Secondary | ICD-10-CM | POA: Diagnosis not present

## 2024-02-06 DIAGNOSIS — I255 Ischemic cardiomyopathy: Secondary | ICD-10-CM | POA: Diagnosis not present

## 2024-02-06 DIAGNOSIS — J0100 Acute maxillary sinusitis, unspecified: Secondary | ICD-10-CM

## 2024-02-06 DIAGNOSIS — I5023 Acute on chronic systolic (congestive) heart failure: Principal | ICD-10-CM

## 2024-02-06 DIAGNOSIS — R0602 Shortness of breath: Secondary | ICD-10-CM | POA: Diagnosis not present

## 2024-02-06 DIAGNOSIS — I110 Hypertensive heart disease with heart failure: Secondary | ICD-10-CM | POA: Diagnosis not present

## 2024-02-06 DIAGNOSIS — E1169 Type 2 diabetes mellitus with other specified complication: Secondary | ICD-10-CM | POA: Diagnosis not present

## 2024-02-06 DIAGNOSIS — L4050 Arthropathic psoriasis, unspecified: Secondary | ICD-10-CM | POA: Diagnosis not present

## 2024-02-06 DIAGNOSIS — J9811 Atelectasis: Secondary | ICD-10-CM | POA: Diagnosis not present

## 2024-02-06 DIAGNOSIS — I5043 Acute on chronic combined systolic (congestive) and diastolic (congestive) heart failure: Principal | ICD-10-CM | POA: Diagnosis present

## 2024-02-06 DIAGNOSIS — Z86711 Personal history of pulmonary embolism: Secondary | ICD-10-CM | POA: Diagnosis not present

## 2024-02-06 DIAGNOSIS — I214 Non-ST elevation (NSTEMI) myocardial infarction: Secondary | ICD-10-CM | POA: Diagnosis not present

## 2024-02-06 DIAGNOSIS — J449 Chronic obstructive pulmonary disease, unspecified: Secondary | ICD-10-CM | POA: Diagnosis not present

## 2024-02-06 DIAGNOSIS — E662 Morbid (severe) obesity with alveolar hypoventilation: Secondary | ICD-10-CM

## 2024-02-06 HISTORY — DX: Cerebrovascular disease, unspecified: I67.9

## 2024-02-06 LAB — CBC
HCT: 51.6 % (ref 39.0–52.0)
Hemoglobin: 17 g/dL (ref 13.0–17.0)
MCH: 27.2 pg (ref 26.0–34.0)
MCHC: 32.9 g/dL (ref 30.0–36.0)
MCV: 82.6 fL (ref 80.0–100.0)
Platelets: UNDETERMINED 10*3/uL (ref 150–400)
RBC: 6.25 MIL/uL — ABNORMAL HIGH (ref 4.22–5.81)
RDW: 16.5 % — ABNORMAL HIGH (ref 11.5–15.5)
WBC: 8.6 10*3/uL (ref 4.0–10.5)
nRBC: 0 % (ref 0.0–0.2)

## 2024-02-06 LAB — BASIC METABOLIC PANEL WITH GFR
Anion gap: 9 (ref 5–15)
Anion gap: 9 (ref 5–15)
BUN: 26 mg/dL — ABNORMAL HIGH (ref 6–20)
BUN: 25 mg/dL — ABNORMAL HIGH (ref 6–20)
CO2: 23 mmol/L (ref 22–32)
CO2: 21 mmol/L — ABNORMAL LOW (ref 22–32)
Calcium: 9.4 mg/dL (ref 8.9–10.3)
Calcium: 9.2 mg/dL (ref 8.9–10.3)
Chloride: 107 mmol/L (ref 98–111)
Chloride: 107 mmol/L (ref 98–111)
Creatinine, Ser: 1.26 mg/dL — ABNORMAL HIGH (ref 0.61–1.24)
Creatinine, Ser: 1.1 mg/dL (ref 0.61–1.24)
GFR, Estimated: >60 mL/min (ref >60)
GFR, Estimated: >60 mL/min (ref >60)
Glucose, Bld: 169 mg/dL — ABNORMAL HIGH (ref 70–99)
Glucose, Bld: 158 mg/dL — ABNORMAL HIGH (ref 70–99)
Potassium: 4.8 mmol/L (ref 3.5–5.1)
Potassium: 4.2 mmol/L (ref 3.5–5.1)
Sodium: 139 mmol/L (ref 135–145)
Sodium: 137 mmol/L (ref 135–145)

## 2024-02-06 LAB — GLUCOSE, CAPILLARY
Glucose-Capillary: 131 mg/dL — ABNORMAL HIGH (ref 70–99)
Glucose-Capillary: 155 mg/dL — ABNORMAL HIGH (ref 70–99)

## 2024-02-06 LAB — BRAIN NATRIURETIC PEPTIDE: B Natriuretic Peptide: 607.5 pg/mL — ABNORMAL HIGH (ref 0.0–100.0)

## 2024-02-06 LAB — TROPONIN I (HIGH SENSITIVITY)
Troponin I (High Sensitivity): 46 ng/L — ABNORMAL HIGH (ref <18)
Troponin I (High Sensitivity): 58 ng/L — ABNORMAL HIGH (ref <18)

## 2024-02-06 LAB — MAGNESIUM: Magnesium: 1.9 mg/dL (ref 1.7–2.4)

## 2024-02-06 MED ORDER — PANTOPRAZOLE SODIUM 40 MG PO TBEC
40.0000 mg | DELAYED_RELEASE_TABLET | Freq: Every day | ORAL | Status: DC
  Administered 2024-02-06 – 2024-02-07 (×2): 40 mg via ORAL
  Filled 2024-02-06 (×2): qty 1

## 2024-02-06 MED ORDER — SPIRONOLACTONE 25 MG PO TABS
25.0000 mg | ORAL_TABLET | Freq: Every day | ORAL | Status: DC
  Administered 2024-02-07: 25 mg via ORAL
  Filled 2024-02-06: qty 1

## 2024-02-06 MED ORDER — ONDANSETRON HCL 4 MG/2ML IJ SOLN: 4.0000 mg | Freq: Four times a day (QID) | INTRAMUSCULAR | Status: DC | PRN

## 2024-02-06 MED ORDER — ACETAMINOPHEN 325 MG PO TABS
650.0000 mg | ORAL_TABLET | Freq: Four times a day (QID) | ORAL | Status: DC | PRN
Start: 2024-02-06 — End: 2024-02-07

## 2024-02-06 MED ORDER — FUROSEMIDE 10 MG/ML IJ SOLN
80.0000 mg | Freq: Two times a day (BID) | INTRAMUSCULAR | Status: DC
  Administered 2024-02-06 – 2024-02-07 (×2): 80 mg via INTRAVENOUS
  Filled 2024-02-06 (×2): qty 8

## 2024-02-06 MED ORDER — ACETAMINOPHEN 650 MG RE SUPP: 650.0000 mg | Freq: Four times a day (QID) | RECTAL | Status: DC | PRN

## 2024-02-06 MED ORDER — ALLOPURINOL 100 MG PO TABS
300.0000 mg | ORAL_TABLET | ORAL | Status: DC
  Administered 2024-02-07: 300 mg via ORAL
  Filled 2024-02-06: qty 3

## 2024-02-06 MED ORDER — FUROSEMIDE 10 MG/ML IJ SOLN
80.0000 mg | Freq: Once | INTRAMUSCULAR | Status: AC
  Administered 2024-02-06: 80 mg via INTRAVENOUS
  Filled 2024-02-06: qty 8

## 2024-02-06 MED ORDER — INSULIN GLARGINE-YFGN 100 UNIT/ML ~~LOC~~ SOLN
36.0000 [IU] | Freq: Every day | SUBCUTANEOUS | Status: DC
  Administered 2024-02-06: 36 [IU] via SUBCUTANEOUS
  Filled 2024-02-06: qty 0.36

## 2024-02-06 MED ORDER — DAPAGLIFLOZIN PROPANEDIOL 10 MG PO TABS
10.0000 mg | ORAL_TABLET | Freq: Every day | ORAL | Status: DC
  Administered 2024-02-07: 10 mg via ORAL
  Filled 2024-02-06: qty 1

## 2024-02-06 MED ORDER — RIVAROXABAN 20 MG PO TABS
20.0000 mg | ORAL_TABLET | Freq: Every day | ORAL | Status: DC
  Administered 2024-02-06: 20 mg via ORAL
  Filled 2024-02-06: qty 1

## 2024-02-06 MED ORDER — FENOFIBRATE 160 MG PO TABS
160.0000 mg | ORAL_TABLET | Freq: Every day | ORAL | Status: DC
  Administered 2024-02-07: 160 mg via ORAL
  Filled 2024-02-06: qty 1

## 2024-02-06 MED ORDER — INSULIN ASPART 100 UNIT/ML IJ SOLN: 0.0000 [IU] | Freq: Every day | INTRAMUSCULAR | Status: DC

## 2024-02-06 MED ORDER — INSULIN ASPART 100 UNIT/ML IJ SOLN
0.0000 [IU] | Freq: Three times a day (TID) | INTRAMUSCULAR | Status: DC
  Administered 2024-02-06: 2 [IU] via SUBCUTANEOUS
  Administered 2024-02-07 (×2): 3 [IU] via SUBCUTANEOUS
  Filled 2024-02-06 (×3): qty 1

## 2024-02-06 MED ORDER — ROSUVASTATIN CALCIUM 10 MG PO TABS
20.0000 mg | ORAL_TABLET | Freq: Every day | ORAL | Status: DC
  Administered 2024-02-06: 20 mg via ORAL
  Filled 2024-02-06: qty 2

## 2024-02-06 MED ORDER — ONDANSETRON HCL 4 MG PO TABS: 4.0000 mg | ORAL_TABLET | Freq: Four times a day (QID) | ORAL | Status: DC | PRN

## 2024-02-06 MED ORDER — ASPIRIN 81 MG PO TBEC
81.0000 mg | DELAYED_RELEASE_TABLET | ORAL | Status: DC
  Administered 2024-02-07: 81 mg via ORAL
  Filled 2024-02-06: qty 1

## 2024-02-06 MED ORDER — AMIODARONE HCL 200 MG PO TABS
200.0000 mg | ORAL_TABLET | Freq: Every day | ORAL | Status: DC
  Administered 2024-02-07: 200 mg via ORAL
  Filled 2024-02-06: qty 1

## 2024-02-06 MED ORDER — SACUBITRIL-VALSARTAN 24-26 MG PO TABS
1.0000 | ORAL_TABLET | Freq: Two times a day (BID) | ORAL | Status: DC
  Administered 2024-02-06 – 2024-02-07 (×2): 1 via ORAL
  Filled 2024-02-06 (×2): qty 1

## 2024-02-06 MED ORDER — POTASSIUM CHLORIDE CRYS ER 20 MEQ PO TBCR
40.0000 meq | EXTENDED_RELEASE_TABLET | Freq: Two times a day (BID) | ORAL | Status: DC
  Administered 2024-02-06 – 2024-02-07 (×2): 40 meq via ORAL
  Filled 2024-02-06 (×2): qty 2

## 2024-02-06 MED ORDER — CARVEDILOL 6.25 MG PO TABS
6.2500 mg | ORAL_TABLET | Freq: Two times a day (BID) | ORAL | Status: DC
  Administered 2024-02-06 – 2024-02-07 (×2): 6.25 mg via ORAL
  Filled 2024-02-06 (×2): qty 1

## 2024-02-06 NOTE — Telephone Encounter (Signed)
 Pt arrived to clinic extremely short of breath requesting a bp check. Pt states that he feels like he did when he had afib. Cannot continue coversation due to shortness of breath. Advised pt needs to be seen in ED. Wheeled pt over to ED and helped him get checked in.

## 2024-02-06 NOTE — ED Triage Notes (Signed)
 Pt to ED via POV. Pt states that he went to the heart failure clinic this morning for blood work and while there he told them he was short of breath. They brought pt over for evaluation and stated they thought he may be in Afib again. Pt states that he has been short of breath for the last 2-3 days. Pt states that [place]'s are worse after exertion. Pt sats in triage 88% on room air. Pt brought to treatment room and placed on 2 liters of oxygen via [place] with sats improving to 95%. Pt states that he does not wear O2 at home but does use a CPAP to sleep. No swelling noted in the ankles but pt states that he feels like his abdomen is swollen.

## 2024-02-06 NOTE — H&P (Signed)
 History and Physical    Patient: Gabriel [name]. ZOX:[id number] DOB: 10/05/1970 DOA: 02/06/2024 DOS: the patient was seen and examined on 02/06/2024 PCP: Tasia Farr, FNP  Patient coming from: Home  Chief Complaint:  Chief Complaint  Patient presents with   Shortness of Breath       HPI:  53 y.o. M with CAD, HTN, DM, OSA, NASH cirrhosis, sCHF EF 35-40%, ICD in place, obesity, CKD IIIa basleine 1-1.6, pHTN, hx CVA, psoriatic arthritis on Taltz, pAF on amio and Xarelto , hx PE, and polycythemia who was sent from CHF clinic for SOB and hypoxia for a few days.  Evidently started to have worsening SOB a few days ago, worse with exertion, as well as new orthopnea.  Went to his routine Cardiology appointment on day of admission where he was visibly SOB, found to have sats 88% on room air and sent to the ER.  In the ER, Cr normal, CXR showed edema, no effusions.  ECG NSR.  BNP 600.   Started on Lasix  and hospitalist service asked to evaluate for CHF flare.         Review of Systems  Constitutional:  Negative for chills, fever and malaise/fatigue.  Respiratory:  Positive for shortness of breath. Negative for sputum production and wheezing.   Cardiovascular:  Positive for orthopnea and leg swelling. Negative for chest pain and PND.  All other systems reviewed and are negative.    Past Medical History:  Diagnosis Date   Anginal pain (HCC)    Aortic atherosclerosis (HCC)    Aortic root dilation (HCC) 11/28/2016   a.) TTE 11/28/2016: 43 mm; b.) TTE 11/23/2019: 38 mm; c.) TTE 08/14/2022: 40 mm; d.) TTE 03/08/2023: 43 mm; e.) TTE 01/02/2024: 40 mm   Arteriosclerosis of right carotid artery    a.) carotid doppler 04/29/2017: <50% RICA   Atrial fibrillation with RVR (HCC)    a.) CHA2DS2VASc = 6 (HFrEF, HTN, CVA x 2, vascular disease, T2DM) as of 01/27/2024; b.) s/p DCCV 11/06/2023 --> 200 J x 1 -->  NSR; c.) rate/rhythm maintained on oral amiodarone  + carvedilol ; chronically  anticoagulated with rivaroxaban    CAD (coronary artery disease)    a.) MV 04/2015: low risk; b.) LHC 01/06/2017: minor irregs in LAD/Diag/LCX/OM, RCA 40p/m/d; c.) R/LHC 06/13/2020: 50% dLAD, 30% p-mLCx, 40% OM1, 100% pRCA (L-R collats) - med mgmt; d.) [facility] 08/19/2023: 50% dLAD, 30% p-mLCx, 40% OM1, 100% pRCA (L-R collats), 50% p-mRCA - med mgmt   Cardiomegaly    Cerebellar infarct (HCC)    a.) brain MRI 06/23/2023: tiny chronic infarct within the RIGHT cerebellar hemisphere; unsure of timing of neurological event; no deficits   Chest wall pain, chronic    Chronic 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    Chronic Troponin Elevation    CKD (chronic kidney disease), stage II-III    COPD (chronic obstructive pulmonary disease) (HCC)    Depression    DM (diabetes mellitus), type 2 (HCC)    Dyspnea    GERD (gastroesophageal reflux disease)    Headache    HFrEF (heart failure with reduced ejection fraction) (HCC)    History of 2019 novel coronavirus disease (COVID-19) 03/01/2022   History of kidney stones    History of methicillin resistant staphylococcus aureus (MRSA)    Hyperlipidemia    Hypertension    ICD (implantable cardioverter-defibrillator) in place 05/20/2022   a.) LEFT midaxillary [employer] Emblem MRI S-ICD (SN: [id number])   Left rotator cuff tear    Liver cirrhosis  secondary to NASH [facility])    Long term current use of immunosuppressive drug    a.) ixekizumab for psoriasis/psoriatic arthritis diagnosis   Long-term use of aspirin  therapy    Mediastinal lymphadenopathy    Mitral regurgitation    Mild to moderate by October 2021 echocardiogram.   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  12/2015 noted on tele-->amio;  b. 12/2015 Event monitor: no VT noted.   Obesity (BMI 30.0-34.9)    On rivaroxaban  therapy    OSA treated with BiPAP    Polyp of descending colon    Psoriasis    Psoriatic arthritis (HCC)    a.) Tx'd with ixekizumab   Pulmonary HTN (HCC) 02/29/2016   a.) TTE 02/29/2016: RVSP 47; b.) TTE 06/26/2019: RVSP 58.4; c.) TTE 12/08/2019: RVSP 40.8; d.) TTE 06/08/2020: RVSP 44.7; e.) R/LHC 06/08/2020: mRA 9, mPA 40, mPWCP 17, AO sat 98, PA sat 61, CO 3.1, CI 1.3, PVR 7.4, LVEDP 20; f.) R/LHC 08/18/2020: mRA 5, mPA 41, mPCWP 18, AO sat 92, PA sat 59, CO 4.3, CI 1.9, PVR 5.3   Recurrent pulmonary emboli (HCC) 06/07/2020   06/07/20: small bilateral PEs.  12/31/19: RUL and RLL PEs.   Secondary polycythemia    a.) negative for JAK2 with reflex and BCR-ABL mutations; seeing hematology with etiology felt to be secondary to underlying COPD (quit smoking 04/2020), heart disease, OSAH, and obesity   Syncope    a. 01/2016 - felt to be vasovagal.   Thrombocytopenia (HCC)    Past Surgical History:  Procedure Laterality Date   CARDIOVERSION N/A 11/06/2023   Procedure: CARDIOVERSION;  Surgeon: Lauralee Poll, MD;  Location: [facility] ORS;  Service: Cardiovascular;  Laterality: N/A;   cardioverted 11/06/23     COLONOSCOPY WITH PROPOFOL  N/A 08/01/2022   Procedure: COLONOSCOPY WITH PROPOFOL ;  Surgeon: Selena Daily, MD;  Location: [facility] ENDOSCOPY;  Service: Gastroenterology;  Laterality: N/A;   ESOPHAGOGASTRODUODENOSCOPY (EGD) WITH PROPOFOL  N/A 08/01/2022   Procedure: ESOPHAGOGASTRODUODENOSCOPY (EGD) WITH PROPOFOL ;  Surgeon: Selena Daily, MD;  Location: [facility] ENDOSCOPY;  Service: Gastroenterology;  Laterality: N/A;   ESOPHAGOGASTRODUODENOSCOPY (EGD) WITH PROPOFOL  N/A 10/16/2022   Procedure: ESOPHAGOGASTRODUODENOSCOPY (EGD) WITH PROPOFOL ;  Surgeon: Selena Daily, MD;  Location: [facility] ENDOSCOPY;  Service: Gastroenterology;  Laterality: N/A;   FINGER AMPUTATION Left     Traumatic   LEFT HEART CATH AND CORONARY ANGIOGRAPHY N/A 01/06/2017   Procedure: Left Heart Cath and Coronary Angiography;  Surgeon: Wenona Hamilton, MD;  Location: [facility] INVASIVE CV LAB;  Service: Cardiovascular;  Laterality: N/A;   RIGHT/LEFT HEART CATH AND CORONARY ANGIOGRAPHY N/A 06/13/2020   Procedure: RIGHT/LEFT HEART CATH AND CORONARY ANGIOGRAPHY;  Surgeon: Sammy Crisp, MD;  Location: [facility] INVASIVE CV LAB;  Service: Cardiovascular;  Laterality: N/A;   RIGHT/LEFT HEART CATH AND CORONARY ANGIOGRAPHY Bilateral 08/19/2023   Procedure: RIGHT/LEFT HEART CATH AND CORONARY ANGIOGRAPHY;  Surgeon: Sammy Crisp, MD;  Location: [facility] INVASIVE CV LAB;  Service: Cardiovascular;  Laterality: Bilateral;   SHOULDER ARTHROSCOPY WITH SUBACROMIAL DECOMPRESSION, ROTATOR CUFF REPAIR AND BICEP TENDON REPAIR Left 09/16/2023   Procedure: SHOULDER ARTHROSCOPY WITH DEBRIDEMENT, DECOMPRESSION, ROTATOR CUFF REPAIR AND BICEPS TENODESIS.;  Surgeon: Elner Hahn, MD;  Location: [facility] ORS;  Service: Orthopedics;  Laterality: Left;   SUBQ ICD IMPLANT N/A 05/20/2022   Procedure: SUBQ ICD IMPLANT;  Surgeon: Boyce Byes, MD;  Location[facility] INVASIVE CV LAB;  Service: Cardiovascular;  Laterality: N/A;   Social History:  reports that he quit smoking about 3 years ago. His smoking use included cigarettes. He started smoking about 36 years ago. He has a 16.5 pack-year smoking history. He quit smokeless tobacco use about 3 years ago. He reports that he does not currently use alcohol. He reports that he does not use drugs.  Allergies  Allergen Reactions   Metformin  And Related Nausea And Vomiting   Zantac  [Ranitidine  Hcl] Diarrhea and Nausea Only    Night sweats    Family History  Problem Relation Age of Onset   Diabetes Mother    Diabetes Mellitus II Mother    Hypothyroidism Mother    Hypertension Mother    Kidney failure Mother        Dialysis   Heart attack Mother        19 yo approximately    Hypertension Father    Gout Father    Cancer Maternal Grandfather    Diabetes Maternal Grandfather    Cancer Paternal Aunt     Prior to Admission medications   Medication Sig Start Date End Date Taking? Authorizing Provider  albuterol  (VENTOLIN  HFA) 108 (90 Base) MCG/ACT inhaler Inhale 2 puffs into the lungs every 4 (four) hours as needed for wheezing or shortness of breath. 10/21/22  Yes Dgayli, Berneta Brightly, MD  allopurinol  (ZYLOPRIM ) 300 MG tablet Take 300 mg by mouth every morning.   Yes [provider]  amiodarone  (PACERONE ) 200 MG tablet Take 1 tablet (200 mg total) by mouth daily. 01/14/24  Yes Boyce Byes, MD  aspirin  EC 81 MG tablet Take 81 mg by mouth every morning.   Yes [provider]  azelastine  (ASTELIN ) 0.1 % nasal spray Place 2 sprays into both nostrils 2 (two) times daily. Use in each nostril as directed Patient taking differently: Place 2 sprays into both nostrils 2 (two) times daily as needed. Use in each nostril as directed 07/31/23  Yes Rufus Council A, FNP  butalbital -acetaminophen -caffeine  (FIORICET ) 50-325-40 MG tablet Take 1 tablet by mouth every 4 (four) hours as needed for migraine. 10/14/23  Yes [provider]  carvedilol  (COREG ) 6.25 MG tablet Take 1 tablet (6.25 mg total) by mouth 2 (two) times daily. 01/13/24  Yes Shawnee Dellen A, FNP  cetirizine  (ZYRTEC ) 10 MG tablet Take 1 tablet (10 mg total) by mouth every morning. Patient taking differently: Take 10 mg by mouth daily as needed for allergies. 09/16/23  Yes Poggi, Kaylene Pascal, MD  dapagliflozin  propanediol (FARXIGA ) 10 MG TABS tablet Take 1 tablet (10 mg total) by mouth daily. 07/07/23  Yes Hackney, Tina A, FNP  fenofibrate  (TRICOR ) 145 MG tablet Take 1 tablet (145 mg total) by mouth every evening. 09/16/23  Yes Poggi, Kaylene Pascal, MD  fluticasone  (FLONASE ) 50 MCG/ACT nasal spray Place 2 sprays into both nostrils daily as needed for allergies or rhinitis. Home med. 09/16/23  Yes Poggi, Kaylene Pascal, MD   Insulin  Glargine (BASAGLAR  KWIKPEN) 100 UNIT/ML Inject 36 Units into the skin at bedtime. 07/11/23 11/22/24 Yes Normie Becton, FNP  insulin  lispro (HUMALOG ) 100 UNIT/ML injection Inject 10 Units into the skin 3 (three) times daily before meals.   Yes [provider]  meclizine  (ANTIVERT ) 25 MG tablet Take 0.5 tablets (12.5 mg total) by mouth 3 (three) times daily as  needed for dizziness. 09/16/23  Yes Poggi, Kaylene Pascal, MD  omeprazole  (PRILOSEC) 40 MG capsule Take 1 capsule (40 mg total) by mouth every morning. 09/16/23  Yes Poggi, Kaylene Pascal, MD  potassium chloride  SA (KLOR-CON  M) 20 MEQ tablet Take 1 tablet (20 mEq total) by mouth every other day. AM 09/16/23  Yes Poggi, Kaylene Pascal, MD  Resmetirom  (REZDIFFRA ) 100 MG TABS TAKE 1 TABLET BY MOUTH DAILY WITH OR WITHOUT FOOD 11/18/23  Yes Vanga, Rohini Reddy, MD  rivaroxaban  (XARELTO ) 20 MG TABS tablet Take 1 tablet (20 mg total) by mouth daily with supper. 07/01/23  Yes Bensimhon, Rheta Celestine, MD  rosuvastatin  (CRESTOR ) 20 MG tablet Take 1 tablet (20 mg total) by mouth at bedtime. 09/16/23  Yes Poggi, Kaylene Pascal, MD  sacubitril -valsartan  (ENTRESTO ) 24-26 MG Take 1 tablet by mouth 2 (two) times daily. 11/07/23  Yes Darus Engels A, DO  Semaglutide , 2 MG/DOSE, (OZEMPIC , 2 MG/DOSE,) 8 MG/3ML SOPN Inject 2 mg into the skin once a week. Tuesdays 11/20/23  Yes Ostwalt, Janna, PA-C  spironolactone  (ALDACTONE ) 25 MG tablet Take 1 tablet by mouth once daily 09/09/23  Yes Hackney, Tina A, FNP  TALTZ 80 MG/ML SOAJ Inject 3 mLs into the skin every 28 (twenty-eight) days. 02/10/23  Yes [provider]  Accu-Chek Softclix Lancets lancets USE UP TO FOUR TIMES DAILY 07/10/22   [provider]  BD PEN NEEDLE NANO 2ND GEN 32G X 4 MM MISC USE  AS DIRECTED AT BEDTIME 02/03/23   Normie Becton, FNP  blood glucose meter kit and supplies KIT Dispense based on patient and insurance preference. Use up to four times daily as directed. 07/08/22   Normie Becton, FNP  colchicine   0.6 MG tablet Take 1 tablet (0.6 mg total) by mouth daily as needed (gout flare). Patient not taking: Reported on 02/06/2024 09/16/23   Poggi, Kaylene Pascal, MD  Continuous Glucose Sensor (FREESTYLE LIBRE 3 SENSOR) MISC Place 1 sensor on the skin every 14 days. Use to check glucose continuously 11/20/23   Ostwalt, Janna, PA-C  furosemide  (LASIX ) 40 MG tablet Take 40 mg by mouth in the morning. Patient not taking: Reported on 02/06/2024    [provider]  Insulin  Pen Needle 32G X 4 MM MISC 1 each by Does not apply route in the morning, at noon, in the evening, and at bedtime. 02/05/23   Normie Becton, FNP  ipratropium-albuterol  (DUONEB) 0.5-2.5 (3) MG/3ML SOLN Take 3 mLs by nebulization every 6 (six) hours as needed. 07/31/23   Clifton, Kellie A, FNP  nitroGLYCERIN  (NITROSTAT ) 0.4 MG SL tablet Place 1 tablet (0.4 mg total) under the tongue every 5 (five) minutes x 3 doses as needed for chest pain. Patient not taking: Reported on 02/06/2024 09/17/22   Normie Becton, FNP    Physical Exam: Vitals:   02/06/24 1115 02/06/24 1130 02/06/24 1524 02/06/24 1947  BP:  (!) 151/96 (!) 143/86 134/80  Pulse: 87 86 75 77  Resp: 18 (!) 21  20  Temp:   97.7 F (36.5 C) 98.3 F (36.8 C)  TempSrc:      SpO2: 95% 95% 97% 96%  Weight:      Height:       Older adult male, lying in bed, interactive and appropriate Oropharynx moist, no oral lesions, hearing normal, anicteric, conjunctiva pink, lids and lashes normal. RRR, soft systolic murmur, pitting edema in both lower extremities, JVP not visible due to body habitus Respiratory rate normal, crackles in bases,  subtle, no wheezing Abdomen soft, somewhat distended, mild ascites Attention normal, affect normal, judgment and insight appear normal, face symmetric, speech fluent, moves all extremities with normal strength and coordination    Data Reviewed: Basic metabolic panel shows normal potassium, sodium, creatinine BNP elevated EKG shows normal sinus  rhythm, personally reviewed, no ST changes Chest x-ray, personally reviewed, shows low volumes, atelectasis, edema increased from previous CBC unremarkable Troponin low and flat, ACS ruled out      Assessment and Plan: Acute on chronic systolic and diastolic congestive heart failure Ischemic cardiomyopathy ICD in place Echo last month showed EF 35-40% - Furosemide  80 mg IV twice a day  - K supplement - Strict I/Os, daily weights, telemetry  - Daily monitoring renal function - Continue Coreg , Farxiga , spironolactone , Entresto    COPD No active flare, not on maintenance inhaler.  Coronary artery disease without angina Hyperlipidemia Hypertension Pulmonary hypertension Cerebrovascular disease - Continue aspirin , Coreg , Farxiga , fenofibrate , Crestor   NASH cirrhosis - Okay to resume resmiterom if patient able to bring from home  Class I obesity BMI 34, complicates care - Continue PPI  Psoriatic arthritis - Hold home Taltz  Sleep apnea - CPAP at night  History of PE - Continue Xarelto   Chronic kidney disease stage IIIa Cr stable relative to baseline  Polycythemia vera Hgb stable relative to baseline, follows with Dr. Adrian Alba        Advance Care Planning: FULL  Consults: None  Family Communication: None  Severity of Illness: The appropriate patient status for this patient is OBSERVATION. Observation status is judged to be reasonable and necessary in order to provide the required intensity of service to ensure the patient's safety. The patient's presenting symptoms, physical exam findings, and initial radiographic and laboratory data in the context of their medical condition is felt to place them at decreased risk for further clinical deterioration. Furthermore, it is anticipated that the patient will be medically stable for discharge from the hospital within 2 midnights of admission.   Author: Ephriam Hashimoto, MD 02/06/2024 8:24 PM  For on call  review [email or web address].

## 2024-02-06 NOTE — ED Provider Notes (Signed)
 [facility] Provider Note    Event Date/Time   First MD Initiated Contact with Patient 02/06/24 1117     (approximate)   History   Chief Complaint Shortness of Breath   HPI  Gabriel [name]. is a 53 y.o. male with past medical history of hypertension, diabetes, CAD, CHF, atrial fibrillation on Xarelto , CKD, and COPD who presents to the ED complaining of shortness of breath.  Patient reports that he has had increasing difficulty breathing over the past 3 days with intermittent squeezing discomfort in his chest.  He denies any chest pain currently, pain has seemed to be exacerbated when he is ambulating.  He has not noticed any swelling in his legs, but states he typically accumulates fluid in his abdomen, which he feels is more distended than usual.  He has not had any fevers, cough, chills, nausea, vomiting, or diarrhea.  He has been compliant with his diuretic medication.     Physical Exam   Triage Vital Signs: ED Triage Vitals  Encounter Vitals Group     BP 02/06/24 1113 (!) 163/93     Girls Systolic BP Percentile --      Girls Diastolic BP Percentile --      Boys Systolic BP Percentile --      Boys Diastolic BP Percentile --      Pulse Rate 02/06/24 1113 94     Resp 02/06/24 1113 (!) 22     Temp 02/06/24 1113 98 F (36.7 C)     Temp Source 02/06/24 1113 Oral     SpO2 02/06/24 1113 (!) 88 %     Weight 02/06/24 1114 245 lb 9.5 oz (111.4 kg)     Height 02/06/24 1114 5' 11 (1.803 m)     Head Circumference --      Peak Flow --      Pain Score 02/06/24 1113 6     Pain Loc --      Pain Education --      Exclude from Growth Chart --     Most recent vital signs: Vitals:   02/06/24 1113 02/06/24 1115  BP: (!) 163/93   Pulse: 94 87  Resp: (!) 22 18  Temp: 98 F (36.7 C)   SpO2: (!) 88% 95%    Constitutional: Alert and oriented. Eyes: Conjunctivae are normal. Head: Atraumatic. Nose: No congestion/rhinnorhea. Mouth/Throat: Mucous  membranes are moist.  Cardiovascular: Normal rate, regular rhythm. Grossly normal heart sounds.  2+ radial pulses bilaterally. Respiratory: Normal respiratory effort.  No retractions. Lungs with crackles to bilateral bases. Gastrointestinal: Soft and nontender.  Mildly distended. Musculoskeletal: No lower extremity tenderness nor edema.  Neurologic:  Normal speech and language. No gross focal neurologic deficits are appreciated.    ED Results / Procedures / Treatments   Labs (all labs ordered are listed, but only abnormal results are displayed) Labs Reviewed  BASIC METABOLIC PANEL WITH GFR - Abnormal; Notable for the following components:      Result Value   CO2 21 (*)    Glucose, Bld 158 (*)    BUN 25 (*)    All other components within normal limits  CBC - Abnormal; Notable for the following components:   RBC 6.25 (*)    RDW 16.5 (*)    All other components within normal limits  BRAIN NATRIURETIC PEPTIDE - Abnormal; Notable for the following components:   B Natriuretic Peptide 607.5 (*)    All other components within normal limits  TROPONIN I (HIGH SENSITIVITY) - Abnormal; Notable for the following components:   Troponin I (High Sensitivity) 46 (*)    All other components within normal limits  MAGNESIUM   TROPONIN I (HIGH SENSITIVITY)     EKG  ED ECG REPORT I, Twilla Galea, the attending physician, personally viewed and interpreted this ECG.   Date: 02/06/2024  EKG Time: 11:05  Rate: 89  Rhythm: normal sinus rhythm  Axis: RAD  Intervals:Prolonged QT  ST&T Change: None  RADIOLOGY Chest x-ray reviewed and interpreted by me with pulmonary edema, no focal infiltrate noted.  PROCEDURES:  Critical Care performed: Yes, see critical care procedure note(s)  .Critical Care  Performed by: Twilla Galea, MD Authorized by: Twilla Galea, MD   Critical care provider statement:    Critical care time (minutes):  30   Critical care time was exclusive of:  Separately  billable procedures and treating other patients and teaching time   Critical care was necessary to treat or prevent imminent or life-threatening deterioration of the following conditions:  Respiratory failure   Critical care was time spent personally by me on the following activities:  Development of treatment plan with patient or surrogate, discussions with consultants, evaluation of patient's response to treatment, examination of patient, ordering and review of laboratory studies, ordering and review of radiographic studies, ordering and performing treatments and interventions, pulse oximetry, re-evaluation of patient's condition and review of old charts   I assumed direction of critical care for this patient from another provider in my specialty: no     Care discussed with: admitting provider      MEDICATIONS ORDERED IN ED: Medications  furosemide  (LASIX ) injection 80 mg (has no administration in time range)     IMPRESSION / MDM / ASSESSMENT AND PLAN / ED COURSE  I reviewed the triage vital signs and the nursing notes.                              53 y.o. male with past medical history of hypertension, diabetes, CAD, CHF, atrial fibrillation on Xarelto , CKD, and COPD who presents to the ED with increasing difficulty breathing as well as intermittent chest discomfort over the past 3 days.  Patient's presentation is most consistent with acute presentation with potential threat to life or bodily function.  Differential diagnosis includes, but is not limited to, ACS, PE, pneumonia, pneumothorax, CHF exacerbation, COPD exacerbation, anemia, electrolyte abnormality, AKI.  Patient nontoxic-appearing and in no acute distress, vital signs remarkable for oxygen saturations of 88% on room air, now improved on 2 L nasal cannula.  He is not in any respiratory distress, does have crackles to bilateral bases with distention of his abdomen concerning for fluid retention.  We will further assess with  chest x-ray and labs, EKG shows no evidence of arrhythmia or ischemia but does show prolonged QT.  Chest x-ray concerning for pulmonary edema by my read, pneumonia considered but seems less likely at this time.  BNP elevated and troponin also elevated above his prior baseline, suspect this is due to CHF.  No significant anemia, leukocytosis, electrolyte abnormality, or AKI.  We will diurese with IV Lasix  and case discussed with hospitalist for admission.      FINAL CLINICAL IMPRESSION(S) / ED DIAGNOSES   Final diagnoses:  Acute on chronic systolic congestive heart failure (HCC)  Acute respiratory failure with hypoxia (HCC)     Rx / DC Orders   ED Discharge Orders  None        Note:  This document was prepared using Dragon voice recognition software and may include unintentional dictation errors.   Twilla Galea, MD 02/06/24 [id number]

## 2024-02-06 NOTE — Hospital Course (Addendum)
 53 y.o. M with CAD, HTN, DM, OSA, NASH cirrhosis, sCHF EF 35-40%, ICD in place, obesity, CKD IIIa basleine 1-1.6, pHTN, hx CVA, psoriatic arthritis on Taltz, pAF on amio and Xarelto , hx PE, and polycythemia who was sent from CHF clinic for SOB and hypoxia for a few days.  Evidently started to have worsening SOB a few days ago, worse with exertion, as well as new orthopnea.  Went to his routine Cardiology appointment on day of admission where he was visibly SOB, found to have sats 88% on room air and sent to the ER.  In the ER, Cr normal, CXR showed edema, no effusions.  ECG NSR.  BNP 600.   Started on Lasix  and hospitalist service asked to evaluate for CHF flare.

## 2024-02-07 DIAGNOSIS — I5043 Acute on chronic combined systolic (congestive) and diastolic (congestive) heart failure: Secondary | ICD-10-CM | POA: Diagnosis not present

## 2024-02-07 LAB — CBC
HCT: 52.5 % — ABNORMAL HIGH (ref 39.0–52.0)
Hemoglobin: 17.1 g/dL — ABNORMAL HIGH (ref 13.0–17.0)
MCH: 27 pg (ref 26.0–34.0)
MCHC: 32.6 g/dL (ref 30.0–36.0)
MCV: 82.8 fL (ref 80.0–100.0)
Platelets: 137 10*3/uL — ABNORMAL LOW (ref 150–400)
RBC: 6.34 MIL/uL — ABNORMAL HIGH (ref 4.22–5.81)
RDW: 16.3 % — ABNORMAL HIGH (ref 11.5–15.5)
WBC: 8.2 10*3/uL (ref 4.0–10.5)
nRBC: 0 % (ref 0.0–0.2)

## 2024-02-07 LAB — BASIC METABOLIC PANEL WITH GFR
Anion gap: 11 (ref 5–15)
BUN: 30 mg/dL — ABNORMAL HIGH (ref 6–20)
CO2: 24 mmol/L (ref 22–32)
Calcium: 9.5 mg/dL (ref 8.9–10.3)
Chloride: 104 mmol/L (ref 98–111)
Creatinine, Ser: 1.17 mg/dL (ref 0.61–1.24)
GFR, Estimated: >60 mL/min (ref >60)
Glucose, Bld: 158 mg/dL — ABNORMAL HIGH (ref 70–99)
Potassium: 3.8 mmol/L (ref 3.5–5.1)
Sodium: 139 mmol/L (ref 135–145)

## 2024-02-07 MED ORDER — METOLAZONE 5 MG PO TABS: 5.0000 mg | ORAL_TABLET | ORAL | 0 refills | Status: DC

## 2024-02-07 MED ORDER — AZELASTINE HCL 0.1 % NA SOLN: 2.0000 | Freq: Two times a day (BID) | NASAL | Status: DC | PRN

## 2024-02-07 MED ORDER — TORSEMIDE 40 MG PO TABS
40.0000 mg | ORAL_TABLET | Freq: Every day | ORAL | 0 refills | Status: DC
Start: 2024-02-07 — End: 2024-02-24

## 2024-02-07 MED ORDER — CETIRIZINE HCL 10 MG PO TABS: 10.0000 mg | ORAL_TABLET | Freq: Every day | ORAL | Status: DC | PRN

## 2024-02-07 MED ORDER — INSULIN GLARGINE-YFGN 100 UNIT/ML ~~LOC~~ SOLN: 36.0000 [IU] | Freq: Every day | SUBCUTANEOUS | Status: DC

## 2024-02-07 NOTE — Progress Notes (Signed)
 Patient is 97% while laying down on room air.  Patient ambulated in the hallway and O2 sats were between 95-98% on room air

## 2024-02-07 NOTE — Progress Notes (Incomplete)
 PROGRESS NOTE  Gabriel [name] Darrel Mickey.    DOB: 12-17-1970, 53 y.o.  FMW:[id number]    Code Status: Full Code   DOA: 02/06/2024   LOS: 0   Brief hospital course  Gabriel [name]. is a 53 y.o. male with a PMH significant for CAD, HTN, DM, OSA, NASH cirrhosis, sCHF EF 35-40%, ICD in place, obesity, CKD IIIa basleine 1-1.6, pHTN, hx CVA, psoriatic arthritis on Taltz, pAF on amio and Xarelto , hx PE, and polycythemia who was sent from CHF clinic for SOB and hypoxia for a few days.   Evidently started to have worsening SOB a few days ago, worse with exertion, as well as new orthopnea.  Went to his routine Cardiology appointment on day of admission where he was visibly SOB, found to have sats 88% on room air and sent to the ER.   In the ER, Cr normal, CXR showed edema, no effusions.  ECG NSR.  BNP 600.    Started on Lasix  and hospitalist service asked to evaluate for CHF flare.    They were initially treated with ***.   Patient was admitted to medicine service for further workup and management of *** as outlined in detail below.  02/07/24 -***  Assessment & Plan  Principal Problem:   Acute on chronic combined systolic and diastolic CHF (congestive heart failure) (HCC) Active Problems:   Chronic obstructive pulmonary disease (COPD) (HCC)   Coronary artery disease, non-occlusive   History of pulmonary embolism   ICD (implantable cardioverter-defibrillator) in place   Type 2 diabetes mellitus with hyperlipidemia (HCC)   OSA on CPAP   Hyperlipidemia associated with type 2 diabetes mellitus (HCC)   Psoriatic arthritis of multiple joints (HCC)   Class 1 obesity with alveolar hypoventilation, serious comorbidity, and body mass index (BMI) of 34.0 to 34.9 in adult [facility])   Essential hypertension   Liver cirrhosis secondary to NASH (HCC)   CKD (chronic kidney disease), stage IIIa (HCC)   Pulmonary hypertension (HCC)   Polycythemia vera (HCC)   Cerebrovascular disease  Acute on chronic systolic  and diastolic congestive heart failure Ischemic cardiomyopathy ICD in place Echo last month showed EF 35-40% - Furosemide  80 mg IV twice a day  - K supplement - Strict I/Os, daily weights, telemetry  - Daily monitoring renal function - Continue Coreg , Farxiga , spironolactone , Entresto     COPD No active flare, not on maintenance inhaler.   Coronary artery disease without angina Hyperlipidemia Hypertension Pulmonary hypertension Cerebrovascular disease - Continue aspirin , Coreg , Farxiga , fenofibrate , Crestor    NASH cirrhosis - Okay to resume resmiterom if patient able to bring from home   Class I obesity BMI 34, complicates care - Continue PPI   Psoriatic arthritis - Hold home Taltz   Sleep apnea - CPAP at night   History of PE - Continue Xarelto    Chronic kidney disease stage IIIa Cr stable relative to baseline   Polycythemia vera Hgb stable relative to baseline, follows with Dr. Jacobo   Body mass index is 33.21 kg/m.  VTE ppx:  rivaroxaban  (XARELTO ) tablet 20 mg   Diet:     Diet   Diet heart healthy/carb modified Room service appropriate? Yes; Fluid consistency: Thin; Fluid restriction: 1800 mL Fluid   Consultants: ***  Subjective 02/07/24    Pt reports ***   Objective  Blood pressure 134/82, pulse 70, temperature 97.6 F (36.4 C), temperature source Oral, resp. rate 19, height 5' 11 (1.803 m), weight 108 kg, SpO2 95%.  Intake/Output Summary (  Last 24 hours) at 02/07/2024 0752 Last data filed at 02/07/2024 0750 Gross per 24 hour  Intake --  Output 3950 ml  Net -3950 ml   Filed Weights   02/06/24 1114 02/07/24 0500  Weight: 111.4 kg 108 kg     Physical Exam: *** General: awake, alert, NAD HEENT: atraumatic, clear conjunctiva, anicteric sclera, MMM, hearing grossly normal Respiratory: normal respiratory effort. Cardiovascular: quick capillary refill, normal S1/S2, RRR, no JVD, murmurs Gastrointestinal: soft, NT, ND Nervous: A&O x3. no  gross focal neurologic deficits, normal speech Extremities: moves all equally, no edema, normal tone Skin: dry, intact, normal temperature, normal color. No rashes, lesions or ulcers on exposed skin Psychiatry: normal mood, congruent affect  Labs   I have personally reviewed the following labs and imaging studies CBC    Component Value Date/Time   WBC 8.2 02/07/2024 0514   RBC 6.34 (H) 02/07/2024 0514   HGB 17.1 (H) 02/07/2024 0514   HGB 18.3 (H) 01/01/2024 0937   HCT 52.5 (H) 02/07/2024 0514   HCT 58.4 (H) 01/01/2024 0937   PLT 137 (L) 02/07/2024 0514   PLT 142 (L) 01/01/2024 0937   MCV 82.8 02/07/2024 0514   MCV 84 01/01/2024 0937   MCV 85 10/07/2013 1703   MCH 27.0 02/07/2024 0514   MCHC 32.6 02/07/2024 0514   RDW 16.3 (H) 02/07/2024 0514   RDW 19.5 (H) 01/01/2024 0937   RDW 14.6 (H) 10/07/2013 1703   LYMPHSABS 2.3 01/27/2024 1320   LYMPHSABS 2.2 01/01/2024 0937   LYMPHSABS 2.3 08/26/2012 1200   MONOABS 0.5 01/27/2024 1320   MONOABS 0.9 08/26/2012 1200   EOSABS 0.2 01/27/2024 1320   EOSABS 0.2 01/01/2024 0937   EOSABS 0.1 08/26/2012 1200   BASOSABS 0.1 01/27/2024 1320   BASOSABS 0.1 01/01/2024 0937   BASOSABS 0.1 08/26/2012 1200      Latest Ref Rng & Units 02/07/2024    5:14 AM 02/06/2024   11:20 AM 02/06/2024   10:35 AM  BMP  Glucose 70 - 99 mg/dL 841  841  830   BUN 6 - 20 mg/dL 30  25  26    Creatinine 0.61 - 1.24 mg/dL 8.82  8.89  8.73   Sodium 135 - 145 mmol/L 139  137  139   Potassium 3.5 - 5.1 mmol/L 3.8  4.2  4.8   Chloride 98 - 111 mmol/L 104  107  107   CO2 22 - 32 mmol/L 24  21  23    Calcium  8.9 - 10.3 mg/dL 9.5  9.2  9.4     DG Chest 2 View Result Date: 02/06/2024 CLINICAL DATA:  Shortness of breath EXAM: CHEST - 2 VIEW COMPARISON:  Chest x-ray 12/12/2023 and older FINDINGS: Implanted defibrillator along the left hemithorax. Underinflation. Left midlung scar or atelectasis. Normal cardiopericardial silhouette. Prominence of the central vasculature and  interstitium. There is some peribronchial thickening identified. No pneumothorax or effusion. No consolidation. Degenerative changes of the spine on lateral view. IMPRESSION: Increasing interstitial changes and some peribronchial thickening. Acute process is possible. Underinflation.  Increasing left midlung atelectasis. Defibrillator. Electronically Signed   By: Ranell Bring M.D.   On: 02/06/2024 14:54    Disposition Plan & Communication  Patient status: Observation  Admitted From: {From:23814} Planned disposition location: {PLAN; DISPOSITION:26386} Anticipated discharge date: *** pending ***  Family Communication: ***    Author: Marien LITTIE Piety, DO [facility] 02/07/2024, 7:52 AM   Available by Epic secure chat 7AM-7PM. If 7PM-7AM, please contact night-coverage.  [facility] contact information found on [email or web address].

## 2024-02-07 NOTE — Plan of Care (Signed)
 IV removed, discharge instructions and patient to be discharged to home

## 2024-02-07 NOTE — Plan of Care (Signed)
   Problem: Education: Goal: Knowledge of General Education information will improve Description Including pain rating scale, medication(s)/side effects and non-pharmacologic comfort measures Outcome: Progressing

## 2024-02-07 NOTE — Discharge Instructions (Signed)
 You have two prescriptions- torsemide  to take daily and the new medication we discussed called metolazone  which acts as a sort of booster for the torsemide  to take every other day.  Please follow up with your cardiologist in 1-2 weeks to discuss refills and any changes needed.

## 2024-02-07 NOTE — Discharge Summary (Signed)
 Physician Discharge Summary  Patient: Gabriel [name]. FMW:[id number] DOB: 1970/12/15   Code Status: Full Code Admit date: 02/06/2024 Discharge date: 02/07/2024 Disposition: Home, No home health services recommended PCP: [name] LABOR, FNP  Recommendations for Outpatient Follow-up:  Follow up with PCP within 1-2 weeks Regarding general hospital follow up and preventative care Recommend electrolyte and renal function monitoring Follow up with cardiology  Discharge Diagnoses:  Principal Problem:   Acute on chronic combined systolic and diastolic CHF (congestive heart failure) (HCC) Active Problems:   Chronic obstructive pulmonary disease (COPD) (HCC)   Coronary artery disease, non-occlusive   History of pulmonary embolism   ICD (implantable cardioverter-defibrillator) in place   Type 2 diabetes mellitus with hyperlipidemia (HCC)   OSA on CPAP   Hyperlipidemia associated with type 2 diabetes mellitus (HCC)   Psoriatic arthritis of multiple joints (HCC)   Class 1 obesity with alveolar hypoventilation, serious comorbidity, and body mass index (BMI) of 34.0 to 34.9 in adult [facility])   Essential hypertension   Liver cirrhosis secondary to NASH (HCC)   CKD (chronic kidney disease), stage IIIa (HCC)   Pulmonary hypertension (HCC)   Polycythemia vera (HCC)   Cerebrovascular disease  Brief Hospital Course Summary: Pt ***  All other chronic conditions were treated with home medications.    Discharge Condition: {DISCHARGE CONDITION:19696}, improved Recommended discharge diet: {Discharge Ipzu:[id number]}  Consultations: ***  Procedures/Studies: ***   Allergies as of 02/07/2024       Reactions   Metformin  And Related Nausea And Vomiting   Zantac  [ranitidine  Hcl] Diarrhea, Nausea Only   Night sweats        Medication List     STOP taking these medications    colchicine  0.6 MG tablet   furosemide  40 MG tablet Commonly known as: LASIX        TAKE these  medications    Accu-Chek Softclix Lancets lancets USE UP TO FOUR TIMES DAILY   albuterol  108 (90 Base) MCG/ACT inhaler Commonly known as: VENTOLIN  HFA Inhale 2 puffs into the lungs every 4 (four) hours as needed for wheezing or shortness of breath.   allopurinol  300 MG tablet Commonly known as: ZYLOPRIM  Take 300 mg by mouth every morning.   amiodarone  200 MG tablet Commonly known as: PACERONE  Take 1 tablet (200 mg total) by mouth daily.   aspirin  EC 81 MG tablet Take 81 mg by mouth every morning.   azelastine  0.1 % nasal spray Commonly known as: ASTELIN  Place 2 sprays into both nostrils 2 (two) times daily as needed. Use in each nostril as directed   Basaglar  KwikPen 100 UNIT/ML Inject 36 Units into the skin at bedtime.   BD Pen Needle Nano 2nd Gen 32G X 4 MM Misc Generic drug: Insulin  Pen Needle USE  AS DIRECTED AT BEDTIME   Insulin  Pen Needle 32G X 4 MM Misc 1 each by Does not apply route in the morning, at noon, in the evening, and at bedtime.   blood glucose meter kit and supplies Kit Dispense based on patient and insurance preference. Use up to four times daily as directed.   butalbital -acetaminophen -caffeine  50-325-40 MG tablet Commonly known as: FIORICET  Take 1 tablet by mouth every 4 (four) hours as needed for migraine.   carvedilol  6.25 MG tablet Commonly known as: COREG  Take 1 tablet (6.25 mg total) by mouth 2 (two) times daily.   cetirizine  10 MG tablet Commonly known as: ZYRTEC  Take 1 tablet (10 mg total) by mouth daily as needed for  allergies.   dapagliflozin  propanediol 10 MG Tabs tablet Commonly known as: FARXIGA  Take 1 tablet (10 mg total) by mouth daily.   Entresto  24-26 MG Generic drug: sacubitril -valsartan  Take 1 tablet by mouth 2 (two) times daily.   fenofibrate  145 MG tablet Commonly known as: TRICOR  Take 1 tablet (145 mg total) by mouth every evening.   fluticasone  50 MCG/ACT nasal spray Commonly known as: FLONASE  Place 2 sprays  into both nostrils daily as needed for allergies or rhinitis. Home med.   FreeStyle Libre 3 Sensor Misc Place 1 sensor on the skin every 14 days. Use to check glucose continuously   insulin  lispro 100 UNIT/ML injection Commonly known as: HUMALOG  Inject 10 Units into the skin 3 (three) times daily before meals.   ipratropium-albuterol  0.5-2.5 (3) MG/3ML Soln Commonly known as: DUONEB Take 3 mLs by nebulization every 6 (six) hours as needed.   meclizine  25 MG tablet Commonly known as: ANTIVERT  Take 0.5 tablets (12.5 mg total) by mouth 3 (three) times daily as needed for dizziness.   metolazone  5 MG tablet Commonly known as: ZAROXOLYN  Take 1 tablet (5 mg total) by mouth every other day.   nitroGLYCERIN  0.4 MG SL tablet Commonly known as: NITROSTAT  Place 1 tablet (0.4 mg total) under the tongue every 5 (five) minutes x 3 doses as needed for chest pain.   omeprazole  40 MG capsule Commonly known as: PRILOSEC Take 1 capsule (40 mg total) by mouth every morning.   Ozempic  (2 MG/DOSE) 8 MG/3ML Sopn Generic drug: Semaglutide  (2 MG/DOSE) Inject 2 mg into the skin once a week. Tuesdays   potassium chloride  SA 20 MEQ tablet Commonly known as: KLOR-CON  M Take 1 tablet (20 mEq total) by mouth every other day. AM   Rezdiffra  100 MG Tabs Generic drug: Resmetirom  TAKE 1 TABLET BY MOUTH DAILY WITH OR WITHOUT FOOD   rivaroxaban  20 MG Tabs tablet Commonly known as: XARELTO  Take 1 tablet (20 mg total) by mouth daily with supper.   rosuvastatin  20 MG tablet Commonly known as: Crestor  Take 1 tablet (20 mg total) by mouth at bedtime.   spironolactone  25 MG tablet Commonly known as: ALDACTONE  Take 1 tablet by mouth once daily   Taltz 80 MG/ML pen Generic drug: ixekizumab Inject 3 mLs into the skin every 28 (twenty-eight) days.   Torsemide  40 MG Tabs Take 40 mg by mouth daily.        Follow-up Information     Gollan, Evalene PARAS, MD. Schedule an appointment as soon as possible for  a visit in 1 week(s).   Specialty: Cardiology Contact information: [address] [place] [address] [phone]                 Subjective   Pt reports ***  All questions and concerns were addressed at time of discharge.  Objective  Blood pressure 125/75, pulse 76, temperature 98.3 F (36.8 C), resp. rate 19, height 5' 11 (1.803 m), weight 108 kg, SpO2 97%.   General: Pt is alert, awake, not in acute distress Cardiovascular: RRR, S1/S2 +, no rubs, no gallops Respiratory: CTA bilaterally, no wheezing, no rhonchi Abdominal: Soft, NT, ND, bowel sounds + Extremities: no edema, no cyanosis  The results of significant diagnostics from this hospitalization (including imaging, microbiology, ancillary and laboratory) are listed below for reference.   Imaging studies: DG Chest 2 View Result Date: 02/06/2024 CLINICAL DATA:  Shortness of breath EXAM: CHEST - 2 VIEW COMPARISON:  Chest x-ray 12/12/2023 and  older FINDINGS: Implanted defibrillator along the left hemithorax. Underinflation. Left midlung scar or atelectasis. Normal cardiopericardial silhouette. Prominence of the central vasculature and interstitium. There is some peribronchial thickening identified. No pneumothorax or effusion. No consolidation. Degenerative changes of the spine on lateral view. IMPRESSION: Increasing interstitial changes and some peribronchial thickening. Acute process is possible. Underinflation.  Increasing left midlung atelectasis. Defibrillator. Electronically Signed   By: Ranell Bring M.D.   On: 02/06/2024 14:54    Labs: Basic Metabolic Panel: Recent Labs  Lab 02/06/24 1035 02/06/24 1120 02/07/24 0514  NA 139 137 139  K 4.8 4.2 3.8  CL 107 107 104  CO2 23 21* 24  GLUCOSE 169* 158* 158*  BUN 26* 25* 30*  CREATININE 1.26* 1.10 1.17  CALCIUM  9.4 9.2 9.5  MG  --  1.9  --    CBC: Recent Labs  Lab 02/06/24 1120 02/07/24 0514  WBC 8.6 8.2  HGB 17.0 17.1*  HCT 51.6 52.5*   MCV 82.6 82.8  PLT PLATELET CLUMPS NOTED ON SMEAR, UNABLE TO ESTIMATE 137*   Microbiology: Results for orders placed or performed during the hospital encounter of 11/03/23  Resp panel by RT-PCR (RSV, Flu A&B, Covid) Anterior Nasal Swab     Status: None   Collection Time: 11/03/23 10:49 AM   Specimen: Anterior Nasal Swab  Result Value Ref Range Status   SARS Coronavirus 2 by RT PCR NEGATIVE NEGATIVE Final    Comment: (NOTE) SARS-CoV-2 target nucleic acids are NOT DETECTED.  The SARS-CoV-2 RNA is generally detectable in upper respiratory specimens during the acute phase of infection. The lowest concentration of SARS-CoV-2 viral copies this assay can detect is 138 copies/mL. A negative result does not preclude SARS-Cov-2 infection and should not be used as the sole basis for treatment or other patient management decisions. A negative result may occur with  improper specimen collection/handling, submission of specimen other than nasopharyngeal swab, presence of viral mutation(s) within the areas targeted by this assay, and inadequate number of viral copies(<138 copies/mL). A negative result must be combined with clinical observations, patient history, and epidemiological information. The expected result is Negative.  Fact Sheet for Patients:  BloggerCourse.com  Fact Sheet for Healthcare Providers:  SeriousBroker.it  This test is no t yet approved or cleared by the [place]  FDA and  has been authorized for detection and/or diagnosis of SARS-CoV-2 by FDA under an Emergency Use Authorization (EUA). This EUA will remain  in effect (meaning this test can be used) for the duration of the COVID-19 declaration under Section 564(b)(1) of the Act, 21 U.S.C.section 360bbb-3(b)(1), unless the authorization is terminated  or revoked sooner.       Influenza A by PCR NEGATIVE NEGATIVE Final   Influenza B by PCR NEGATIVE NEGATIVE Final     Comment: (NOTE) The Xpert Xpress SARS-CoV-2/FLU/RSV plus assay is intended as an aid in the diagnosis of influenza from Nasopharyngeal swab specimens and should not be used as a sole basis for treatment. Nasal washings and aspirates are unacceptable for Xpert Xpress SARS-CoV-2/FLU/RSV testing.  Fact Sheet for Patients: BloggerCourse.com  Fact Sheet for Healthcare Providers: SeriousBroker.it  This test is not yet approved or cleared by the [place]  FDA and has been authorized for detection and/or diagnosis of SARS-CoV-2 by FDA under an Emergency Use Authorization (EUA). This EUA will remain in effect (meaning this test can be used) for the duration of the COVID-19 declaration under Section 564(b)(1) of the Act, 21 U.S.C. section 360bbb-3(b)(1), unless the authorization  is terminated or revoked.     Resp Syncytial Virus by PCR NEGATIVE NEGATIVE Final    Comment: (NOTE) Fact Sheet for Patients: BloggerCourse.com  Fact Sheet for Healthcare Providers: SeriousBroker.it  This test is not yet approved or cleared by the [place]  FDA and has been authorized for detection and/or diagnosis of SARS-CoV-2 by FDA under an Emergency Use Authorization (EUA). This EUA will remain in effect (meaning this test can be used) for the duration of the COVID-19 declaration under Section 564(b)(1) of the Act, 21 U.S.C. section 360bbb-3(b)(1), unless the authorization is terminated or revoked.  Performed at [facility], [address] [place], [place] [address]    *Note: Due to a large number of results and/or encounters for the requested time period, some results have not been displayed. A complete set of results can be found in Results Review.    Time coordinating discharge: Over 30 minutes  Marien LITTIE Piety, MD  [facility] 02/07/2024, 12:02 PM

## 2024-02-09 ENCOUNTER — Telehealth: Payer: Self-pay | Admitting: *Deleted

## 2024-02-09 ENCOUNTER — Encounter
Admission: RE | Admit: 2024-02-09 | Discharge: 2024-02-09 | Disposition: A | Discharge status: Home / Self Care | Payer: Self-pay | Source: Ambulatory Visit | Attending: General Surgery | Admitting: General Surgery

## 2024-02-09 ENCOUNTER — Other Ambulatory Visit: Payer: Self-pay

## 2024-02-09 ENCOUNTER — Ambulatory Visit: Payer: Self-pay | Admitting: General Surgery

## 2024-02-09 VITALS — Ht 71.0 in | Wt 238.1 lb

## 2024-02-09 DIAGNOSIS — Z01812 Encounter for preprocedural laboratory examination: Secondary | ICD-10-CM

## 2024-02-09 DIAGNOSIS — E1122 Type 2 diabetes mellitus with diabetic chronic kidney disease: Secondary | ICD-10-CM

## 2024-02-09 HISTORY — DX: Pneumonia, unspecified organism: J18.9

## 2024-02-09 HISTORY — DX: Atherosclerotic heart disease of native coronary artery with other forms of angina pectoris: I25.118

## 2024-02-09 LAB — GLUCOSE, CAPILLARY
Glucose-Capillary: 194 mg/dL — ABNORMAL HIGH (ref 70–99)
Glucose-Capillary: 195 mg/dL — ABNORMAL HIGH (ref 70–99)

## 2024-02-09 NOTE — Patient Instructions (Addendum)
 Your procedure is scheduled on:  MONDAY JUNE 30  Report to the Registration Desk on the 1st floor of the [facility] To find out your arrival time, please call [phone] between 1PM - 3PM on:  FRIDAY JUNE 27  If your arrival time is 6:00 am, do not arrive before that time as the [facility] entrance doors do not open until 6:00 am.  REMEMBER: Instructions that are not followed completely may result in serious medical risk, up to and including death; or upon the discretion of your surgeon and anesthesiologist your surgery may need to be rescheduled.  Do not eat food after midnight the night before surgery.  No gum chewing or hard candies.  One week prior to surgery:  MONDAY JUNE 23  Stop Anti-inflammatories (NSAIDS) such as Advil , Aleve , Ibuprofen , Motrin , Naproxen , Naprosyn  and Aspirin  based products such as Excedrin, Goody's Powder, BC Powder. Stop ANY OVER THE COUNTER supplements until after surgery. azelastine  (ASTELIN )  fluticasone  (FLONASE )  You may however, continue to take Tylenol  if needed for pain up until the day of surgery.  **Follow guidelines for insulin  and diabetes medications.** dapagliflozin  propanediol (FARXIGA ) hold 3 days, last dose THURSDAY JUNE 26  Insulin  Glargine take half dose Sunday night before surgery ( 18 units)  insulin  lispro (HUMALOG ) before meals take usual dose, DO NOT TAKE THE MORNING OF SURGERY  Semaglutide , hold for 7 days prior to surgery, last dose SUNDAY JUNE 22  **Follow recommendations regarding stopping blood thinners.** rivaroxaban  (XARELTO ) hold 2 days, last dose FRIDAY JUNE 27  Continue taking all of your other prescription medications up until the day of surgery.  ON THE DAY OF SURGERY ONLY TAKE THESE MEDICATIONS WITH SIPS OF WATER:  carvedilol  (COREG )  omeprazole  (PRILOSEC)  Resmetirom  (REZDIFFRA )   Use inhalers on the day of surgery and bring to the hospital. albuterol  (VENTOLIN  HFA)   No Alcohol for 24 hours before or after  surgery.  Do not use any recreational drugs for at least a week (preferably 2 weeks) before your surgery.  Please be advised that the combination of cocaine and anesthesia may have negative outcomes, up to and including death. If you test positive for cocaine, your surgery will be cancelled.  On the morning of surgery brush your teeth with toothpaste and water, you may rinse your mouth with mouthwash if you wish. Do not swallow any toothpaste or mouthwash.  Do not wear jewelry, make-up, hairpins, clips or nail polish.  For welded (permanent) jewelry: bracelets, anklets, waist bands, etc.  Please have this removed prior to surgery.  If it is not removed, there is a chance that hospital personnel will need to cut it off on the day of surgery.  Do not wear lotions, powders, or perfumes.   Do not shave body hair from the neck down 48 hours before surgery.  Do not bring valuables to the hospital. [facility] is not responsible for any missing/lost belongings or valuables.   Notify your doctor if there is any change in your medical condition (cold, fever, infection).  Wear comfortable clothing (specific to your surgery type) to the hospital.  After surgery, you can help prevent lung complications by doing breathing exercises.  Take deep breaths and cough every 1-2 hours.   If you are being discharged the day of surgery, you will not be allowed to drive home. You will need a responsible individual to drive you home and stay with you for 24 hours after surgery.   If you are  taking public transportation, you will need to have a responsible individual with you.  Please call the Pre-admissions Testing Dept. at [phone] if you have any questions about these instructions.  Surgery Visitation Policy:  Patients having surgery or a procedure may have two visitors.  Children under the age of 47 must have an adult with them who is not the patient.

## 2024-02-09 NOTE — Telephone Encounter (Signed)
 1. What type of surgery is being performed?  EXCISION, LESION, SCALP   2. When is this surgery scheduled?  02/16/2024    3. Type of clearance being requested (medical, pharmacy, both)?  BOTH    4. Are there any medications that need to be held prior to surgery?  RIVAROXABAN    5. Practice name and name of physician performing surgery?  Performing surgeon: Dr. Lucas Sjogren, MD  Requesting clearance: Gabriel Pereyra, FNP-C     6. Anesthesia type (none, local, MAC, general)?  GENERAL   7. What is the office phone and fax number?   Phone: [phone]  Fax: [phone]   LAST OV:  01/21/24 NEXT OV: 02/24/24

## 2024-02-09 NOTE — Telephone Encounter (Signed)
-----   Message from Dorise CHARLENA Pereyra sent at 02/09/2024  4:04 PM EDT ----- Regarding: Request for pre-operative cardiac clearance Request for pre-operative cardiac clearance:  1. What type of surgery is being performed?  EXCISION, LESION, SCALP  2. When is this surgery scheduled?  02/16/2024  3. Type of clearance being requested (medical, pharmacy, both)? BOTH   4. Are there any medications that need to be held prior to surgery? RIVAROXABAN   5. Practice name and name of physician performing surgery?  Performing surgeon: Dr. Lucas Sjogren, MD Requesting clearance: Dorise Pereyra, FNP-C    6. Anesthesia type (none, local, MAC, general)? GENERAL  7. What is the office phone and fax number?   Phone: [phone] Fax: [phone]  ATTENTION: Unable to create telephone message as per your standard workflow. Directed by [facility] providers to send requests for cardiac clearance to this pool for appropriate distribution to provider covering pre-operative clearances.   Dorise Pereyra, MSN, APRN, FNP-C, CEN [facility]  Peri-operative Services Nurse Practitioner Phone: [phone] 02/09/24 4:04 PM

## 2024-02-10 ENCOUNTER — Encounter: Payer: Self-pay | Admitting: Urgent Care

## 2024-02-10 ENCOUNTER — Encounter: Payer: Self-pay | Admitting: General Surgery

## 2024-02-10 NOTE — Telephone Encounter (Signed)
 Looks like we never cleared him to hold Xarelto   Patient with diagnosis of afib on Xarelto  for anticoagulation.    Procedure: EXCISION, LESION, SCALP  Date of procedure: 02/16/24   CHA2DS2-VASc Score = 6   This indicates a 9.7% annual risk of stroke. The patient's score is based upon: CHF History: 1 HTN History: 1 Diabetes History: 1 Stroke History: 2 Vascular Disease History: 1 Age Score: 0 Gender Score: 0      Patient also with recurrent PE  CrCl 89 ml/min Platelet count 137  Patient has not had an Afib/aflutter ablation within the last 3 months or DCCV within the last 30 days  Per office protocol, patient can hold Xarelto  for 1 day prior to procedure.    **This guidance is not considered finalized until pre-operative APP has relayed final recommendations.**

## 2024-02-10 NOTE — Progress Notes (Signed)
 178Perioperative / Anesthesia Services  Pre-Admission Testing Clinical Review / Pre-Operative Anesthesia Consult  Date: 02/10/24  PATIENT DEMOGRAPHICS: Name: Gabriel [name]. DOB: [birth year] MRN:   [id number]  Note: Available PAT nursing documentation and vital signs have been reviewed. Clinical nursing staff has updated patient's PMH/PSHx, current medication list, and drug allergies/intolerances to ensure complete and comprehensive history available to assist care teams in MDM as it pertains to the aforementioned surgical procedure and anticipated anesthetic course. Extensive review of available clinical information personally performed. Vesta PMH and PSHx updated with any diagnoses/procedures that  may have been inadvertently omitted during his intake with the pre-admission testing department's nursing staff.  PLANNED SURGICAL PROCEDURE(S):   Case: [id number] Date/Time: 02/16/24 0826   Procedure: EXCISION, LESION, SCALP (Scalp)   Anesthesia type: General   Pre-op  diagnosis: R22.0 Scalp mass   Location: [facility] OR ROOM 03 / [facility]   Surgeons: Rodolph Romano, MD        CLINICAL DISCUSSION: Gabriel [name]. is a 53 y.o. male who is submitted for pre-surgical anesthesia review and clearance prior to him undergoing the above procedure. P. Patient is a Former Smoker (16.5 pack years; quit 04/2020). 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   Patient is followed by cardiology (Gollan, MD). He was last seen in the cardiology clinic on 01/21/2024; notes reviewed. At the time  of his clinic visit, patient doing well overall from a cardiovascular perspective. Patient denied any chest pain, shortness of breath, PND, orthopnea, palpitations, significant peripheral edema, weakness, fatigue, vertiginous symptoms, or presyncope/syncope. Patient with a past medical history significant for cardiovascular diagnoses. Documented physical exam was grossly benign, providing no evidence of acute exacerbation and/or decompensation of the patient's known cardiovascular conditions.   Patient suffered an NSTEMI in May 2018.  Diagnostic LEFT heart catheterization on 01/06/2017 revealed a severely reduced left ventricular systolic function with an EF of 25-35%.  There was global hypokinesis.  LVEDP documented in the high 20s. Coronary artery disease noted with minor luminal irregularities and the LAD, diagonal, LCx, and OM.  There was 40% stenosis of the proximal to distal RCA noted. Given the nonobstructive nature of his coronary artery disease, the decision was made to defer intervention opting for medical management.   Patient with a history of a mixed ischemic and nonischemic cardiomyopathy resulting in HFrEF and pulmonary hypertension.  Cardiac function has been serially monitored via various modalities since time of his diagnosis.     Diagnostic RIGHT/LEFT heart catheterization was performed on 06/13/2020 revealing multivessel CAD; 50% distal LAD, 30% proximal to mid LCx, 40% OM1, and 100% proximal RCA.  RCA with left-to-right collateral formation.  Hemodynamics: LVEDP = 20 mmHg, mean RA = 9 mmHg, mean PA = 40 mmHg, mean PCWP = 17 mmHg, AO saturation = 98%, PA saturation = 61%.  CO = 3.1 L/min, CI = 1.3 L/min/m, and PVR 7.4 Wood units.   Patient underwent placement of a Interior and spatial designer MRI subcutaneous AICD on 05/20/2022.  Device is regularly interrogated by patient's primary electrophysiology team.  Most recent interrogation was on 01/13/2024, at which time device was functioning  properly.    Patient underwent MRI imaging of brain on 06/23/2023 that revealed a tiny chronic  RIGHT cerebellar infarct.  Timing of neurological event uncertain.  Patient has no resulting neurological deficits.  Repeat diagnostic RIGHT/LEFT heart catheterization was performed on 08/19/2023 revealing multivessel CAD; 50% distal LAD, 30% proximal-mid LCx, 40% OM1, chronic total occlusion of the proximal RCA with left-to-right collateral formation, and 50% proximal and mid RCA.  Coronary arteries relatively stable overall.  Given the chronic nature of the RCA occlusion, no intervention was deemed necessary.  Hemodynamics: LVEDP = 20 mmHg, mean RA = 5 mmHg, mean PA = 41 mmHg, mean PCWP = 18 mmHg, AO saturation, 92%, PA saturation = 59%.  CO = 4.3 L/min, CI = 1.9 L/min/m, and PVR 5.3 Wood units.  Function cardiac event monitor study performed on 12/30/2023 revealed a predominant underlying sinus rhythm with rate 76 bpm.  There were 5 runs of NSVT with the fastest lasting 4 beats at a maximum rate of 182 bpm, and the longest lasting 5 beats an average rate of 119 bpm.  Regular atrial and ventricular ectopy noted.  Most recent TTE performed on 01/02/2024 revealed a moderately reduced left ventricular systolic function with an EF of 35-40%. There was mild LVH.  There was global left ventricular hypokinesis.  Right ventricular size and function normal with a TAPSE measuring 1.1 cm  (normal range >/= 1.6 cm).  Left atrium was mildly dilated.  There was mild to moderate aortic regurgitation.  All transvalvular gradients were noted to be normal providing no evidence suggestive of valvular stenosis.  Aortic root was mildly dilated measuring up to 40 mm.  Patient with an atrial fibrillation diagnosis; CHA2DS2-VASc Score = 6 (HFrEF, HTN, CVA x 2, vascular disease, T2DM). He underwent a DCCV procedure on 11/06/2023, at which time he received a single 200 J synchronized cardioversion that restored NSR. Rate and rhythm are  currently being maintained on oral amiodarone  + carvedilol . He is chronically anticoagulated using rivaroxaban ; reported to be compliant with therapy with no evidence or reports of GI/GU bleeding.  Ischemic/nonischemic cardiomyopathy with resulting HFrEF managed using beta-blocker (carvedilol ), and diuretic (torsemide  + spironolactone ), and ARB/ANRi (Entresto ) therapies.  Blood pressure documented at 124/80 mmHg.  Patient has a supply of short acting nitrates (NTG) to use on a as needed basis for recurrent angina/anginal equivalent symptoms; denied recent use.  Patient is on fenofibrate  + rosuvastatin  for his HLD diagnosis/ and ASCVD prevention. T2DM well controlled on currently prescribed regimen; last HgbA1c was 6.9% when checked on 01/01/2024.  In the setting of known cardiovascular diagnoses and concurrent T2DM, patient is on an SGLT2i (dapagliflozin ) for added cardiovascular and renovascular protection. He does have an OSAH diagnosis and is reported to be compliant with prescribed nocturnal PAP therapy. Patient is able to complete all of his ADL/IADLs without cardiovascular limitation.  Per the DASI, patient is able to achieve at least 4 METS of physical activity without experiencing any significant degree of angina/anginal equivalent symptoms. No changes were made to his medication regimen during his visit with cardiology. Patient scheduled to follow-up with outpatient cardiology in 4 months or sooner if needed.   Gabriel LOISE Darrel Mickey. is scheduled for an elective EXCISION, LESION, SCALP (Scalp) on 02/16/2024 with Dr. Lucas Sjogren, MD.  Given patient's past medical history significant for cardiovascular diagnoses, presurgical cardiac clearance was sought by the PAT team. Per cardiology, based ACC/AHA guidelines, the patient's past medical history, and the amount of time since his last clinic visit, this patient would be at an overall ACCEPTABLE risk for the planned procedure without further  cardiovascular  testing or intervention at this time.   Again, this patient is on daily antithrombotic and anticoagulation therapies.  He has been instructed on recommendations for holding his rivaroxaban  for 2 days prior to his procedure with plans to restart as soon as postoperatively respectively minimized by his primary attending surgeon.  Patient is aware that his last dose of rivaroxaban  should be on 02/13/2024. Given that patient's past medical history is significant for cardiovascular diagnoses, including but not limited to CAD, general surgery has cleared patient to continue his daily low dose ASA throughout his perioperative course. He will be asked to hold his normal dose on the day of his procedure only. Patient has been updated on these directives from his specialty care providers by the PAT team.  Patient denies previous perioperative complications with anesthesia in the past. In review his EMR, it is noted that patient underwent a general anesthetic course here at [facility] (ASA III) in 10/2023 without documented complications.    MOST RECENT VITAL SIGNS:    02/09/2024    2:51 PM 02/07/2024   11:38 AM 02/07/2024    7:53 AM  Vitals with BMI  Height 5' 11    Weight 238 lbs 2 oz    BMI 33.22    Systolic  125 131  Diastolic  75 87  Pulse  76 72   PROVIDERS/SPECIALISTS: NOTE: Primary physician provider listed below. Patient may have been seen by APP or partner within same practice.   PROVIDER ROLE / SPECIALTY LAST SHERLEAN Rodolph Romano, MD  General Surgery (Surgeon) 01/20/2024  [name] LABOR, FNP Primary Care Provider 01/01/2024  Perla Lye, MD Cardiology 08/29/2023; preop APP call 01/21/2024  Cherrie Sieving, MD Advanced Heart Failure 01/21/2024  Cindie Smalls, MD Electrophysiology 12/24/2023  Jacobo Lye, MD Medical Oncology/Hematology 01/27/2024   ALLERGIES: Allergies  Allergen Reactions   Metformin  And Related  Nausea And Vomiting   Zantac  [Ranitidine  Hcl] Diarrhea and Nausea Only    Night sweats    CURRENT HOME MEDICATIONS: No current facility-administered medications for this encounter.    Accu-Chek Softclix Lancets lancets   albuterol  (VENTOLIN  HFA) 108 (90 Base) MCG/ACT inhaler   allopurinol  (ZYLOPRIM ) 300 MG tablet   amiodarone  (PACERONE ) 200 MG tablet   aspirin  EC 81 MG tablet   azelastine  (ASTELIN ) 0.1 % nasal spray   BD PEN NEEDLE NANO 2ND GEN 32G X 4 MM MISC   blood glucose meter kit and supplies KIT   butalbital -acetaminophen -caffeine  (FIORICET ) 50-325-40 MG tablet   carvedilol  (COREG ) 6.25 MG tablet   cetirizine  (ZYRTEC ) 10 MG tablet   Continuous Glucose Sensor (FREESTYLE LIBRE 3 SENSOR) MISC   dapagliflozin  propanediol (FARXIGA ) 10 MG TABS tablet   fenofibrate  (TRICOR ) 145 MG tablet   fluticasone  (FLONASE ) 50 MCG/ACT nasal spray   Insulin  Glargine (BASAGLAR  KWIKPEN) 100 UNIT/ML   insulin  lispro (HUMALOG ) 100 UNIT/ML injection   Insulin  Pen Needle 32G X 4 MM MISC   ipratropium-albuterol  (DUONEB) 0.5-2.5 (3) MG/3ML SOLN   meclizine  (ANTIVERT ) 25 MG tablet   metolazone  (ZAROXOLYN ) 5 MG tablet   nitroGLYCERIN  (NITROSTAT ) 0.4 MG SL tablet   omeprazole  (PRILOSEC) 40 MG capsule   potassium chloride  SA (KLOR-CON  M) 20 MEQ tablet   Resmetirom  (REZDIFFRA ) 100 MG TABS   rivaroxaban  (XARELTO ) 20 MG TABS tablet   rosuvastatin  (CRESTOR ) 20 MG tablet   sacubitril -valsartan  (ENTRESTO ) 24-26 MG   Semaglutide , 2 MG/DOSE, (OZEMPIC , 2 MG/DOSE,) 8 MG/3ML SOPN   spironolactone  (ALDACTONE ) 25 MG tablet  TALTZ 80 MG/ML SOAJ   Torsemide  40 MG TABS   HISTORY: Past Medical History:  Diagnosis Date   Anginal pain (HCC)    Aortic atherosclerosis (HCC)    Aortic root dilation (HCC) 11/28/2016   a.) TTE 11/28/2016: 43 mm; b.) TTE 11/23/2019: 38 mm; c.) TTE 08/14/2022: 40 mm; d.) TTE 03/08/2023: 43 mm; e.) TTE 01/02/2024: 40 mm   Arteriosclerosis of right carotid artery    a.) carotid doppler  04/29/2017: <50% RICA   Atrial fibrillation with RVR (HCC)    a.) CHA2DS2VASc = 6 (HFrEF, HTN, CVA x 2, vascular disease, T2DM) as of 01/27/2024; b.) s/p DCCV 11/06/2023 --> 200 J x 1 -->  NSR; c.) rate/rhythm maintained on oral amiodarone  + carvedilol ; chronically anticoagulated with rivaroxaban    CAD (coronary artery disease)    a.) MV 04/2015: low risk; b.) LHC 01/06/2017: minor irregs in LAD/Diag/LCX/OM, RCA 40p/m/d; c.) R/LHC 06/13/2020: 50% dLAD, 30% p-mLCx, 40% OM1, 100% pRCA (L-R collats) - med mgmt; d.) [facility] 08/19/2023: 50% dLAD, 30% p-mLCx, 40% OM1, 100% pRCA (L-R collats), 50% p-mRCA - med mgmt   Cardiomegaly    Cerebellar infarct (HCC)    a.) brain MRI 06/23/2023: tiny chronic infarct within the RIGHT cerebellar hemisphere; unsure of timing of neurological event; no deficits   Chest wall pain, chronic    Chronic 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    Chronic Troponin Elevation    CKD (chronic kidney disease), stage II-III    COPD (chronic obstructive pulmonary disease) (HCC)    CPAP (continuous positive airway pressure) dependence 03/08/2023   Depression    DM (diabetes mellitus), type 2 (HCC)    Dyspnea    GERD (gastroesophageal reflux disease)    Headache    HFrEF (heart failure with reduced ejection fraction) (HCC)    History of 2019 novel coronavirus disease (COVID-19) 03/01/2022   History of kidney stones    History of methicillin resistant staphylococcus aureus (MRSA)    Hyperlipidemia    Hypertension    ICD (implantable cardioverter-defibrillator) in place 05/20/2022   a.) LEFT midaxillary [employer] Emblem MRI S-ICD (SN: [id number])   Left rotator cuff tear    Liver cirrhosis secondary to NASH (HCC)    Long term current use of amiodarone     Long term current use of immunosuppressive drug    a.) ixekizumab for psoriasis/psoriatic arthritis diagnosis   Long-term use of aspirin  therapy    Mediastinal lymphadenopathy    Mitral regurgitation    Mild to moderate by  October 2021 echocardiogram.   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 12/2015 noted on tele-->amio;  b. 12/2015 Event monitor: no VT noted.   Obesity (BMI 30.0-34.9)    On rivaroxaban  therapy    OSA treated with BiPAP    Pneumonia    Polyp of descending colon    Psoriasis    Psoriatic arthritis (HCC)    a.) Tx'd with ixekizumab   Pulmonary HTN (HCC) 02/29/2016   a.) TTE 02/29/2016: RVSP 47; b.) TTE 06/26/2019: RVSP 58.4; c.) TTE 12/08/2019: RVSP 40.8; d.) TTE 06/08/2020: RVSP 44.7; e.) R/LHC 06/08/2020: mRA 9, mPA 40, mPWCP 17, AO sat 98, PA sat 61, CO 3.1, CI 1.3, PVR 7.4, LVEDP 20;  f.) R/LHC 08/18/2020: mRA 5, mPA 41, mPCWP 18, AO sat 92, PA sat 59, CO 4.3, CI 1.9, PVR 5.3   Recurrent pulmonary emboli (HCC) 06/07/2020   06/07/20: small bilateral PEs.  12/31/19: RUL and RLL PEs.   Secondary polycythemia    a.) negative for JAK2 with reflex and BCR-ABL mutations; seeing hematology with etiology felt to be secondary to underlying COPD (quit smoking 04/2020), heart disease, OSAH, and obesity   Stroke [facility])    Syncope    a. 01/2016 - felt to be vasovagal.   Thrombocytopenia (HCC)    Past Surgical History:  Procedure Laterality Date   CARDIOVERSION N/A 11/06/2023   Procedure: CARDIOVERSION;  Surgeon: Zenaida Morene PARAS, MD;  Location: [facility] ORS;  Service: Cardiovascular;  Laterality: N/A;   cardioverted 11/06/23     COLONOSCOPY WITH PROPOFOL  N/A 08/01/2022   Procedure: COLONOSCOPY WITH PROPOFOL ;  Surgeon: Unk Corinn Skiff, MD;  Location: [facility] ENDOSCOPY;  Service: Gastroenterology;  Laterality: N/A;   ESOPHAGOGASTRODUODENOSCOPY (EGD) WITH PROPOFOL  N/A 08/01/2022   Procedure:  ESOPHAGOGASTRODUODENOSCOPY (EGD) WITH PROPOFOL ;  Surgeon: Unk Corinn Skiff, MD;  Location: [facility] ENDOSCOPY;  Service: Gastroenterology;  Laterality: N/A;   ESOPHAGOGASTRODUODENOSCOPY (EGD) WITH PROPOFOL  N/A 10/16/2022   Procedure: ESOPHAGOGASTRODUODENOSCOPY (EGD) WITH PROPOFOL ;  Surgeon: Unk Corinn Skiff, MD;  Location: [facility] ENDOSCOPY;  Service: Gastroenterology;  Laterality: N/A;   FINGER AMPUTATION Left    Traumatic   LEFT HEART CATH AND CORONARY ANGIOGRAPHY N/A 01/06/2017   Procedure: Left Heart Cath and Coronary Angiography;  Surgeon: Darron Deatrice LABOR, MD;  Location: [facility] INVASIVE CV LAB;  Service: Cardiovascular;  Laterality: N/A;   RIGHT/LEFT HEART CATH AND CORONARY ANGIOGRAPHY N/A 06/13/2020   Procedure: RIGHT/LEFT HEART CATH AND CORONARY ANGIOGRAPHY;  Surgeon: Mady Bruckner, MD;  Location: [facility] INVASIVE CV LAB;  Service: Cardiovascular;  Laterality: N/A;   RIGHT/LEFT HEART CATH AND CORONARY ANGIOGRAPHY Bilateral 08/19/2023   Procedure: RIGHT/LEFT HEART CATH AND CORONARY ANGIOGRAPHY;  Surgeon: Mady Bruckner, MD;  Location: [facility] INVASIVE CV LAB;  Service: Cardiovascular;  Laterality: Bilateral;   SHOULDER ARTHROSCOPY WITH SUBACROMIAL DECOMPRESSION, ROTATOR CUFF REPAIR AND BICEP TENDON REPAIR Left 09/16/2023   Procedure: SHOULDER ARTHROSCOPY WITH DEBRIDEMENT, DECOMPRESSION, ROTATOR CUFF REPAIR AND BICEPS TENODESIS.;  Surgeon: [name] Norleen PARAS, MD;  Location: [facility] ORS;  Service: Orthopedics;  Laterality: Left;   SUBQ ICD IMPLANT N/A 05/20/2022   Procedure: SUBQ ICD IMPLANT;  Surgeon: Cindie Ole DASEN, MD;  [facility] INVASIVE CV LAB;  Service: Cardiovascular;  Laterality: N/A;   Family History  Problem Relation Age of Onset   Diabetes Mother    Diabetes Mellitus II Mother    Hypothyroidism Mother    Hypertension Mother    Kidney failure Mother        Dialysis   Heart attack Mother        89 yo approximately   Hypertension Father    Gout Father    Cancer Maternal  Grandfather    Diabetes Maternal Grandfather    Cancer Paternal Aunt    Social History   Tobacco Use   Smoking status: Former    Current packs/day: 0.00    Average packs/day: 0.5 packs/day for 33.0 years (16.5 ttl pk-yrs)    Types: Cigarettes    Start date: 05/09/1987    Quit date: 05/08/2020    Years since quitting: 3.7   Smokeless tobacco: Former    Quit date: 05/08/2020   Tobacco comments:    Quit Sept 2021  Substance Use Topics   Alcohol use: Not  Currently   LABS:  Lab Results  Component Value Date   WBC 8.2 02/07/2024   HGB 17.1 (H) 02/07/2024   HCT 52.5 (H) 02/07/2024   MCV 82.8 02/07/2024   PLT 137 (L) 02/07/2024   Lab Results  Component Value Date   NA 139 02/07/2024   CL 104 02/07/2024   K 3.8 02/07/2024   CO2 24 02/07/2024   BUN 30 (H) 02/07/2024   CREATININE 1.17 02/07/2024   GFRNONAA >60 02/07/2024   CALCIUM  9.5 02/07/2024   PHOS 4.4 11/06/2023   ALBUMIN  4.6 01/01/2024   GLUCOSE 158 (H) 02/07/2024   Lab Results  Component Value Date   HGBA1C 6.9 (H) 01/01/2024    ECG: Date: 02/06/2024  Time ECG obtained: 1105 AM Rate: 89 bpm Rhythm: normal sinus Axis (leads I and aVF): Thisright Intervals: PR 178 ms. QRS 104 ms. QTc 491 ms. ST segment and T wave changes: No evidence of acute T wave abnormalities or significant ST segment elevation or depression.  Evidence of a possible, age undetermined, prior infarct:  No Comparison: Similar to previous tracing obtained on 12/24/2023   IMAGING / PROCEDURES: TRANSTHORACIC ECHOCARDIOGRAM performed on 01/02/2024 Left ventricular ejection fraction, by estimation, is 35 to 40%. Left ventricular ejection fraction by 2D MOD biplane is 35%. Left ventricular ejection fraction by PLAX is 36%. The left ventricle has moderately decreased function. The left ventricle demonstrates global hypokinesis. There is mild left ventricular hypertrophy. Left ventricular diastolic function could not be evaluated.  Right ventricular  systolic function is low normal. The right ventricular size is normal.  Left atrial size was mildly dilated.  The mitral valve is normal in structure. No evidence of mitral valve regurgitation.  The aortic valve is tricuspid. Aortic valve regurgitation is mild to moderate.  Aortic dilatation noted. There is mild dilatation of the aortic root, measuring 40 mm.  The inferior vena cava is normal in size with greater than 50% respiratory variability, suggesting right atrial pressure of 3 mmHg.   LONG TERM CARDIAC EVENT MONITOR STUDY performed on 12/30/2023 Patch Wear Time:  6 days and 21 hours (2025-04-23T09:07:01-399 to 2025-04-30T07:00:53-0400) Sinus rhythm - avg HR of 76 bpm. Five runs of nonsustained Ventricular Tachycardia occurred, the run with the fastest interval lasting 4 beats with a max rate of 182 bpm, the longest lasting 5 beats with an avg rate of 119 bpm.  Rare PACs and PVCs. Two patient-triggered events associated with sinus rhythm    CT ANGIO CHEST PE W/CM &/OR WO CM performed on 11/03/2023 No evidence of pulmonary embolus. Significant left shoulder effusion with bony destruction along the superolateral margin of the left humeral head. Please correlate with any history of recent trauma, otherwise septic arthritis and osteomyelitis should be considered. Cardiomegaly without pericardial effusion. Aortic atherosclerosis  Coronary artery atherosclerosis.   RIGHT/LEFT HEART CATHETERIZATION AND CORONARY ANGIOGRAPHY performed on 08/19/2023 Relatively stable appearance of the coronary arteries since last catheterization in 2021.  Chronically occluded proximal RCA now has antegrade flow, though I suspect this represents bridging collaterals.  Left-to-right collateral remain as well as moderate LAD and LCx disease. Mildly elevated left heart filling pressures (LVEDP 20 mmHg, PCWP 18 mmHg). Moderate pulmonary hypertension (mean PA 41 mmHg, PVR 5.3 WU). Moderately reduced Fick cardiac  output/index (CO 4.3 L/min, CI 1.9 L/min/m^2).   MR BRAIN/IAC W WO CONTRAST performed on 06/23/2023 No evidence of an acute intracranial abnormality. No cerebellopontine angle or internal auditory canal mass. 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 Mild  generalized cerebral volume loss. Incidentally noted 8 mm subcutaneous forehead lipoma.   PULMONARY FUNCTION TESTING performed on 10/17/2022     Latest Ref Rng & Units 10/17/2022    8:14 AM  PFT Results  FVC-Pre L 4.38   FVC-Predicted Pre % 85   FVC-Post L 4.50   FVC-Predicted Post % 87   Pre FEV1/FVC % % 83   Post FEV1/FCV % % 85   FEV1-Pre L 3.61   FEV1-Predicted Pre % 91   FEV1-Post L 3.83   DLCO uncorrected ml/min/mmHg 20.99   DLCO UNC% % 70   DLVA Predicted % 69   TLC L 6.58   TLC % Predicted % 91   RV % Predicted % 87     IMPRESSION AND PLAN: Gabriel Samad. has been referred for pre-anesthesia review and clearance prior to him undergoing the planned anesthetic and procedural courses. Available labs, pertinent testing, and imaging results were personally reviewed by me in preparation for upcoming operative/procedural course. [facility] medical record has been updated following extensive record review and patient interview with PAT staff.   This patient has been appropriately cleared by cardiology with an overall ACCEPTABLE risk of patient experiencing significant perioperative cardiovascular complications. Completed perioperative prescription for cardiac device management documentation completed by primary cardiology team and placed on patient's chart for review by the surgical/anesthetic team on the day of his procedure. Electrophysiology indicating that procedure may interfere with planned surgical procedure, and are therefore recommending magnet placement. Beyond normal perioperative cardiovascular monitoring, and the aforementioned magnet placement, there are no recommendations from  electrophysiology team that prompt further discussion/recommendations from industry representative.   Based on clinical review performed today (02/10/24), barring any significant acute changes in the patient's overall condition, it is anticipated that he will be able to proceed with the planned surgical intervention. Any acute changes in clinical condition may necessitate his procedure being postponed and/or cancelled. Patient will meet with anesthesia team (MD and/or CRNA) on the day of his procedure for preoperative evaluation/assessment. Questions regarding anesthetic course will be fielded at that time.   Pre-surgical instructions were reviewed with the patient during his PAT appointment, and questions were fielded to satisfaction by PAT clinical staff. He has been instructed on which medications that he will need to hold prior to surgery, as well as the ones that have been deemed safe/appropriate to take on the day of his procedure. As part of the general education provided by PAT, patient made aware both verbally and in writing, that he would need to abstain from the use of any illegal substances during his perioperative course. He was advised that failure to follow the provided instructions could necessitate case cancellation or result in serious perioperative complications up to and including death. Patient encouraged to contact PAT and/or his surgeon's office to discuss any questions or concerns that may arise prior to surgery; verbalized understanding.   Dorise Pereyra, MSN, APRN, FNP-C, CEN [facility]  Perioperative Services Nurse Practitioner Phone: [phone] Fax: [phone] 02/10/24 1:47 PM  NOTE: This note has been prepared using Dragon dictation software. Despite my best ability to proofread, there is always the potential that unintentional transcriptional errors may still occur from this process.

## 2024-02-10 NOTE — Telephone Encounter (Signed)
 Elnor Dorise BRAVO, NP  P Cv Div Preop Callback Hello team...  Please disregard this request. They did not tell me that he was rescheduled from earlier this month. Jackee, NP-C has already cleared this patient. Nothing further is needed.  Respectfully, Dorise Elnor, MSN, APRN, FNP-C, CEN [facility] Perioperative Services Nurse Practitioner 02/10/24 12:22 PM

## 2024-02-16 ENCOUNTER — Telehealth: Payer: Self-pay

## 2024-02-16 ENCOUNTER — Encounter: Payer: Self-pay | Admitting: General Surgery

## 2024-02-16 ENCOUNTER — Other Ambulatory Visit (HOSPITAL_COMMUNITY): Payer: Self-pay

## 2024-02-16 ENCOUNTER — Ambulatory Visit: Payer: Self-pay | Admitting: Urgent Care

## 2024-02-16 ENCOUNTER — Other Ambulatory Visit: Payer: Self-pay

## 2024-02-16 ENCOUNTER — Encounter
Admission: RE | Disposition: A | Discharge status: Home / Self Care | Payer: Self-pay | Source: Ambulatory Visit | Attending: General Surgery

## 2024-02-16 ENCOUNTER — Ambulatory Visit
Admission: RE | Admit: 2024-02-16 | Discharge: 2024-02-16 | Disposition: A | Discharge status: Home / Self Care | Payer: Self-pay | Source: Ambulatory Visit | Attending: General Surgery | Admitting: General Surgery

## 2024-02-16 DIAGNOSIS — Z7901 Long term (current) use of anticoagulants: Secondary | ICD-10-CM | POA: Insufficient documentation

## 2024-02-16 DIAGNOSIS — L729 Follicular cyst of the skin and subcutaneous tissue, unspecified: Secondary | ICD-10-CM | POA: Insufficient documentation

## 2024-02-16 DIAGNOSIS — K7581 Nonalcoholic steatohepatitis (NASH): Secondary | ICD-10-CM | POA: Insufficient documentation

## 2024-02-16 DIAGNOSIS — Z87891 Personal history of nicotine dependence: Secondary | ICD-10-CM | POA: Diagnosis not present

## 2024-02-16 DIAGNOSIS — F32A Depression, unspecified: Secondary | ICD-10-CM | POA: Diagnosis not present

## 2024-02-16 DIAGNOSIS — Z7984 Long term (current) use of oral hypoglycemic drugs: Secondary | ICD-10-CM | POA: Insufficient documentation

## 2024-02-16 DIAGNOSIS — I4891 Unspecified atrial fibrillation: Secondary | ICD-10-CM | POA: Insufficient documentation

## 2024-02-16 DIAGNOSIS — Z01812 Encounter for preprocedural laboratory examination: Secondary | ICD-10-CM

## 2024-02-16 DIAGNOSIS — D751 Secondary polycythemia: Secondary | ICD-10-CM | POA: Insufficient documentation

## 2024-02-16 DIAGNOSIS — I2510 Atherosclerotic heart disease of native coronary artery without angina pectoris: Secondary | ICD-10-CM | POA: Insufficient documentation

## 2024-02-16 DIAGNOSIS — I428 Other cardiomyopathies: Secondary | ICD-10-CM | POA: Diagnosis not present

## 2024-02-16 DIAGNOSIS — E1122 Type 2 diabetes mellitus with diabetic chronic kidney disease: Secondary | ICD-10-CM | POA: Insufficient documentation

## 2024-02-16 DIAGNOSIS — Z794 Long term (current) use of insulin: Secondary | ICD-10-CM | POA: Diagnosis not present

## 2024-02-16 DIAGNOSIS — D696 Thrombocytopenia, unspecified: Secondary | ICD-10-CM | POA: Insufficient documentation

## 2024-02-16 DIAGNOSIS — Z95810 Presence of automatic (implantable) cardiac defibrillator: Secondary | ICD-10-CM | POA: Diagnosis not present

## 2024-02-16 DIAGNOSIS — I255 Ischemic cardiomyopathy: Secondary | ICD-10-CM | POA: Diagnosis not present

## 2024-02-16 DIAGNOSIS — N183 Chronic kidney disease, stage 3 unspecified: Secondary | ICD-10-CM | POA: Insufficient documentation

## 2024-02-16 DIAGNOSIS — Z7985 Long-term (current) use of injectable non-insulin antidiabetic drugs: Secondary | ICD-10-CM | POA: Insufficient documentation

## 2024-02-16 DIAGNOSIS — I491 Atrial premature depolarization: Secondary | ICD-10-CM | POA: Diagnosis not present

## 2024-02-16 DIAGNOSIS — G4733 Obstructive sleep apnea (adult) (pediatric): Secondary | ICD-10-CM | POA: Diagnosis not present

## 2024-02-16 DIAGNOSIS — I493 Ventricular premature depolarization: Secondary | ICD-10-CM | POA: Insufficient documentation

## 2024-02-16 DIAGNOSIS — L4050 Arthropathic psoriasis, unspecified: Secondary | ICD-10-CM | POA: Diagnosis not present

## 2024-02-16 DIAGNOSIS — I252 Old myocardial infarction: Secondary | ICD-10-CM | POA: Diagnosis not present

## 2024-02-16 DIAGNOSIS — I272 Pulmonary hypertension, unspecified: Secondary | ICD-10-CM | POA: Diagnosis not present

## 2024-02-16 DIAGNOSIS — I130 Hypertensive heart and chronic kidney disease with heart failure and stage 1 through stage 4 chronic kidney disease, or unspecified chronic kidney disease: Secondary | ICD-10-CM | POA: Diagnosis not present

## 2024-02-16 DIAGNOSIS — I5022 Chronic systolic (congestive) heart failure: Secondary | ICD-10-CM | POA: Insufficient documentation

## 2024-02-16 DIAGNOSIS — Z86711 Personal history of pulmonary embolism: Secondary | ICD-10-CM | POA: Insufficient documentation

## 2024-02-16 DIAGNOSIS — Z8673 Personal history of transient ischemic attack (TIA), and cerebral infarction without residual deficits: Secondary | ICD-10-CM | POA: Insufficient documentation

## 2024-02-16 DIAGNOSIS — R220 Localized swelling, mass and lump, head: Secondary | ICD-10-CM | POA: Diagnosis not present

## 2024-02-16 DIAGNOSIS — N1831 Chronic kidney disease, stage 3a: Secondary | ICD-10-CM

## 2024-02-16 DIAGNOSIS — Z7982 Long term (current) use of aspirin: Secondary | ICD-10-CM | POA: Insufficient documentation

## 2024-02-16 DIAGNOSIS — I472 Ventricular tachycardia, unspecified: Secondary | ICD-10-CM | POA: Diagnosis not present

## 2024-02-16 HISTORY — PX: LESION EXCISION: SHX5167

## 2024-02-16 LAB — GLUCOSE, CAPILLARY
Glucose-Capillary: 297 mg/dL — ABNORMAL HIGH (ref 70–99)
Glucose-Capillary: 233 mg/dL — ABNORMAL HIGH (ref 70–99)

## 2024-02-16 SURGERY — EXCISION, LESION, SCALP
Anesthesia: General | Site: Scalp | Wound class: Clean

## 2024-02-16 MED ORDER — INSULIN ASPART 100 UNIT/ML IJ SOLN
10.0000 [IU] | Freq: Once | INTRAMUSCULAR | Status: AC
  Administered 2024-02-16: 10 [IU] via SUBCUTANEOUS

## 2024-02-16 MED ORDER — DROPERIDOL 2.5 MG/ML IJ SOLN: 0.6250 mg | Freq: Once | INTRAMUSCULAR | Status: DC | PRN

## 2024-02-16 MED ORDER — MIDAZOLAM HCL 2 MG/2ML IJ SOLN
INTRAMUSCULAR | Status: AC
  Filled 2024-02-16: qty 2

## 2024-02-16 MED ORDER — CHLORHEXIDINE GLUCONATE 0.12 % MT SOLN
OROMUCOSAL | Status: AC
  Filled 2024-02-16: qty 15

## 2024-02-16 MED ORDER — FENTANYL CITRATE (PF) 100 MCG/2ML IJ SOLN
INTRAMUSCULAR | Status: AC
  Filled 2024-02-16: qty 2

## 2024-02-16 MED ORDER — CHLORHEXIDINE GLUCONATE 0.12 % MT SOLN
15.0000 mL | Freq: Once | OROMUCOSAL | Status: AC
  Administered 2024-02-16: 15 mL via OROMUCOSAL

## 2024-02-16 MED ORDER — OXYCODONE HCL 5 MG PO TABS: 5.0000 mg | ORAL_TABLET | Freq: Once | ORAL | Status: DC | PRN

## 2024-02-16 MED ORDER — ORAL CARE MOUTH RINSE: 15.0000 mL | Freq: Once | OROMUCOSAL | Status: AC

## 2024-02-16 MED ORDER — PROPOFOL 1000 MG/100ML IV EMUL
INTRAVENOUS | Status: AC
  Filled 2024-02-16: qty 100

## 2024-02-16 MED ORDER — BUPIVACAINE-EPINEPHRINE 0.5% -1:200000 IJ SOLN
INTRAMUSCULAR | Status: DC | PRN
  Administered 2024-02-16: 4 mL

## 2024-02-16 MED ORDER — BUPIVACAINE-EPINEPHRINE (PF) 0.5% -1:200000 IJ SOLN
INTRAMUSCULAR | Status: AC
  Filled 2024-02-16: qty 30

## 2024-02-16 MED ORDER — FENTANYL CITRATE (PF) 100 MCG/2ML IJ SOLN: 25.0000 ug | INTRAMUSCULAR | Status: DC | PRN

## 2024-02-16 MED ORDER — HYDROCODONE-ACETAMINOPHEN 5-325 MG PO TABS: 1.0000 | ORAL_TABLET | Freq: Four times a day (QID) | ORAL | 0 refills | Status: AC | PRN

## 2024-02-16 MED ORDER — CEFAZOLIN SODIUM-DEXTROSE 2-4 GM/100ML-% IV SOLN
2.0000 g | INTRAVENOUS | Status: AC
  Administered 2024-02-16: 2 g via INTRAVENOUS

## 2024-02-16 MED ORDER — LACTATED RINGERS IV SOLN: INTRAVENOUS | Status: DC | PRN

## 2024-02-16 MED ORDER — OXYCODONE HCL 5 MG/5ML PO SOLN: 5.0000 mg | Freq: Once | ORAL | Status: DC | PRN

## 2024-02-16 MED ORDER — INSULIN ASPART 100 UNIT/ML IJ SOLN
INTRAMUSCULAR | Status: AC
  Filled 2024-02-16: qty 1

## 2024-02-16 MED ORDER — SODIUM CHLORIDE 0.9 % IV SOLN: INTRAVENOUS | Status: DC

## 2024-02-16 MED ORDER — LIDOCAINE HCL (CARDIAC) PF 100 MG/5ML IV SOSY
PREFILLED_SYRINGE | INTRAVENOUS | Status: DC | PRN
  Administered 2024-02-16: 100 mg via INTRAVENOUS

## 2024-02-16 MED ORDER — ACETAMINOPHEN 10 MG/ML IV SOLN: 1000.0000 mg | Freq: Once | INTRAVENOUS | Status: DC | PRN

## 2024-02-16 MED ORDER — PROPOFOL 10 MG/ML IV BOLUS
INTRAVENOUS | Status: DC | PRN
  Administered 2024-02-16: 30 mg via INTRAVENOUS
  Administered 2024-02-16: 100 ug/kg/min via INTRAVENOUS

## 2024-02-16 MED ORDER — MIDAZOLAM HCL 2 MG/2ML IJ SOLN
INTRAMUSCULAR | Status: DC | PRN
  Administered 2024-02-16: 2 mg via INTRAVENOUS

## 2024-02-16 MED ORDER — CEFAZOLIN SODIUM-DEXTROSE 2-4 GM/100ML-% IV SOLN
INTRAVENOUS | Status: AC
  Filled 2024-02-16: qty 100

## 2024-02-16 SURGICAL SUPPLY — 23 items
CHLORAPREP W/TINT 26 (MISCELLANEOUS) IMPLANT
DERMABOND ADVANCED .7 DNX12 (GAUZE/BANDAGES/DRESSINGS) ×2 IMPLANT
DRAPE LAPAROTOMY 77X122 PED (DRAPES) ×2 IMPLANT
ELECTRODE REM PT RTRN 9FT ADLT (ELECTROSURGICAL) ×2 IMPLANT
GAUZE 4X4 16PLY ~~LOC~~+RFID DBL (SPONGE) IMPLANT
GLOVE BIO SURGEON STRL SZ 6.5 (GLOVE) ×2 IMPLANT
GLOVE BIOGEL PI IND STRL 6.5 (GLOVE) ×2 IMPLANT
GLOVE SURG SYN 6.5 ES PF (GLOVE) IMPLANT
GLOVE SURG SYN 6.5 PF PI (GLOVE) ×4 IMPLANT
GOWN STRL REUS W/ TWL LRG LVL3 (GOWN DISPOSABLE) ×2 IMPLANT
KIT TURNOVER KIT A (KITS) ×2 IMPLANT
LABEL OR SOLS (LABEL) ×2 IMPLANT
MANIFOLD NEPTUNE II (INSTRUMENTS) ×2 IMPLANT
NDL HYPO 22X1.5 SAFETY MO (MISCELLANEOUS) ×2 IMPLANT
NEEDLE HYPO 22X1.5 SAFETY MO (MISCELLANEOUS) ×1 IMPLANT
NS IRRIG 500ML POUR BTL (IV SOLUTION) ×2 IMPLANT
PACK BASIN MINOR ARMC (MISCELLANEOUS) ×2 IMPLANT
SUT ETHILON 3-0 (SUTURE) IMPLANT
SUT VIC AB 2-0 SH 27XBRD (SUTURE) ×2 IMPLANT
SUTURE MNCRL 4-0 27XMF (SUTURE) ×2 IMPLANT
SYR 10ML LL (SYRINGE) ×2 IMPLANT
TRAP FLUID SMOKE EVACUATOR (MISCELLANEOUS) ×2 IMPLANT
WATER STERILE IRR 500ML POUR (IV SOLUTION) ×2 IMPLANT

## 2024-02-16 NOTE — Transfer of Care (Signed)
 Immediate Anesthesia Transfer of Care Note  Patient: Gabriel [name].  Procedure(s) Performed: EXCISION, LESION, SCALP (Scalp)  Patient Location: PACU  Anesthesia Type:General  Level of Consciousness: drowsy and patient cooperative  Airway & Oxygen Therapy: Patient Spontanous Breathing and Patient connected to nasal cannula oxygen  Post-op Assessment: Report given to RN and Post -op Vital signs reviewed and stable  Post vital signs: stable  Last Vitals:  Vitals Value Taken Time  BP    Temp    Pulse 83 02/16/24 08:10  Resp 17 02/16/24 08:10  SpO2 95 % 02/16/24 08:10  Vitals shown include unfiled device data.  Last Pain:  Vitals:   02/16/24 0620  PainSc: 3          Complications: No notable events documented.

## 2024-02-16 NOTE — Telephone Encounter (Signed)
 Pharmacy Patient Advocate Encounter   Received notification from Onbase that prior authorization for Ozempic  (2 MG/DOSE) 8MG /3ML pen-injectors  is required/requested.   Insurance verification completed.   The patient is insured through [facility] .   Per test claim: PA required; PA submitted to above mentioned insurance via CoverMyMeds Key/confirmation #/EOC [id number] Status is pending

## 2024-02-16 NOTE — Op Note (Signed)
 OPERATION REPORT  Pre Operative Diagnosis: Scalp mass  Post operative diagnosis: Scalp mass  Anesthesia: MAC and Local   Surgeon: Dr. Rodolph   Indication: This 53 y.o. year old male with a soft tissue mass at the scalp that is growing in size.    Description of procedure: after orienting patient about the procedure steps and benefits and patient agreed to proceed. Time out was done identifying correct patient and location of procedure. After induction of monitored sedation, local anesthesia was infiltrated around the palpable lesion. With a blade #15, an elliptical incision was made using the skin lines. Sharp dissection was carried down and lesion was excised including dermal tissue. The mass measured 2 cm. Deep dermal stitches were done with vicryl 2-0 to repair the laceration and skin closed with Monocryl 4-0 in subcuticular fashion. Specimen sent to pathology.    Complications: none   EBL: minimal  Lucas Rodolph, MD, FACS

## 2024-02-16 NOTE — Discharge Instructions (Addendum)
  Diet: Resume home heart healthy regular diet.   Activity: Increase activity as tolerated.   Wound care: May shower with soapy water and pat dry (do not rub incisions), but no baths or submerging incision underwater until follow-up. (no swimming)   Medications: Resume all home medications. For mild to moderate pain: acetaminophen (Tylenol) or ibuprofen (if no kidney disease). Combining Tylenol with alcohol can substantially increase your risk of causing liver disease. Narcotic pain medications, if prescribed, can be used for severe pain, though may cause nausea, constipation, and drowsiness. Do not combine Tylenol and Norco within a 6 hour period as Norco contains Tylenol. If you do not need the narcotic pain medication, you do not need to fill the prescription.  Call office [phone]) at any time if any questions, worsening pain, fevers/chills, bleeding, drainage from incision site, or other concerns.

## 2024-02-16 NOTE — Anesthesia Preprocedure Evaluation (Signed)
 Anesthesia Evaluation  Patient identified by MRN, date of birth, ID band Patient awake    Reviewed: Allergy  & Precautions, H&P , NPO status , Patient's Chart, lab work & pertinent test results, reviewed documented beta blocker date and time   Airway Mallampati: III  TM Distance: >3 FB Neck ROM: full    Dental  (+) Teeth Intact, Poor Dentition   Pulmonary shortness of breath and with exertion, sleep apnea and Continuous Positive Airway Pressure Ventilation , pneumonia, resolved, COPD, former smoker   Pulmonary exam normal        Cardiovascular Exercise Tolerance: Good hypertension, On Medications + angina with exertion + CAD, + Past MI, +CHF and + DOE  (-) Orthopnea and (-) PND Normal cardiovascular exam Rate:Normal     Neuro/Psych  Headaches PSYCHIATRIC DISORDERS  Depression    CVA, No Residual Symptoms    GI/Hepatic ,GERD  Medicated,,(+) Hepatitis -  Endo/Other  negative endocrine ROSdiabetes, Poorly Controlled, Type 1, Insulin  Dependent    Renal/GU Renal disease  negative genitourinary   Musculoskeletal   Abdominal   Peds  Hematology negative hematology ROS (+)   Anesthesia Other Findings   Reproductive/Obstetrics negative OB ROS                             Anesthesia Physical Anesthesia Plan  ASA: 3  Anesthesia Plan: General LMA   Post-op Pain Management:    Induction:   PONV Risk Score and Plan: 3  Airway Management Planned:   Additional Equipment:   Intra-op Plan:   Post-operative Plan:   Informed Consent: I have reviewed the patients History and Physical, chart, labs and discussed the procedure including the risks, benefits and alternatives for the proposed anesthesia with the patient or authorized representative who has indicated his/her understanding and acceptance.       Plan Discussed with: CRNA  Anesthesia Plan Comments:        Anesthesia Quick  Evaluation

## 2024-02-16 NOTE — Interval H&P Note (Signed)
 History and Physical Interval Note:  02/16/2024 7:07 AM  Gabriel [name].  has presented today for surgery, with the diagnosis of R22.0 Scalp mass.  The various methods of treatment have been discussed with the patient and family. After consideration of risks, benefits and other options for treatment, the patient has consented to  Procedure(s): EXCISION, LESION, SCALP (N/A) as a surgical intervention.  The patient's history has been reviewed, patient examined, no change in status, stable for surgery.  I have reviewed the patient's chart and labs.  Questions were answered to the patient's satisfaction.     Lucas Sjogren

## 2024-02-16 NOTE — Anesthesia Postprocedure Evaluation (Signed)
 Anesthesia Post Note  Patient: Gabriel N Dunaj Jr.  Procedure(s) Performed: EXCISION, LESION, SCALP (Scalp)  Patient location during evaluation: PACU Anesthesia Type: General Level of consciousness: awake and alert Pain management: pain level controlled Vital Signs Assessment: post-procedure vital signs reviewed and stable Respiratory status: spontaneous breathing, nonlabored ventilation, respiratory function stable and patient connected to nasal cannula oxygen Cardiovascular status: blood pressure returned to baseline and stable Postop Assessment: no apparent nausea or vomiting Anesthetic complications: no   There were no known notable events for this encounter.   Last Vitals:  Vitals:   02/16/24 0830 02/16/24 0840  BP: 118/74 123/79  Pulse: 77 75  Resp: 12 15  Temp: (!) 36.3 C (!) 36.1 C  SpO2: 96% 99%    Last Pain:  Vitals:   02/16/24 0840  TempSrc: Temporal  PainSc: 0-No pain                 Lynwood KANDICE Clause

## 2024-02-17 ENCOUNTER — Other Ambulatory Visit: Payer: Self-pay | Admitting: Family Medicine

## 2024-02-17 ENCOUNTER — Other Ambulatory Visit: Payer: Self-pay

## 2024-02-17 ENCOUNTER — Other Ambulatory Visit (HOSPITAL_COMMUNITY): Payer: Self-pay

## 2024-02-17 ENCOUNTER — Ambulatory Visit: Payer: Medicaid Other

## 2024-02-17 ENCOUNTER — Encounter: Payer: Self-pay | Admitting: General Surgery

## 2024-02-17 DIAGNOSIS — N1832 Chronic kidney disease, stage 3b: Secondary | ICD-10-CM

## 2024-02-17 DIAGNOSIS — I428 Other cardiomyopathies: Secondary | ICD-10-CM

## 2024-02-17 LAB — SURGICAL PATHOLOGY

## 2024-02-17 MED ORDER — SACUBITRIL-VALSARTAN 24-26 MG PO TABS: 1.0000 | ORAL_TABLET | Freq: Two times a day (BID) | ORAL | 1 refills | Status: DC

## 2024-02-17 MED FILL — Continuous Glucose System Sensor: 30 days supply | Qty: 2 | Fill #0 | Status: CN

## 2024-02-17 NOTE — Telephone Encounter (Signed)
 Pharmacy Patient Advocate Encounter  Received notification from [facility] that Prior Authorization for Ozempic  (2 MG/DOSE) 8MG /3ML pen-injectors  has been APPROVED from 02/16/24 to 02/15/25. Unable to obtain price due to refill too soon rejection, last fill date 02/16/24 next available fill date09/01/25   PA #/Case ID/Reference #: [id number]

## 2024-02-18 ENCOUNTER — Other Ambulatory Visit: Payer: Self-pay

## 2024-02-18 LAB — CUP PACEART REMOTE DEVICE CHECK
Battery Remaining Percentage: 81 %
Date Time Interrogation Session: 20250701115900
HighPow Impedance: 90 Ohm
Implantable Lead Connection Status: 753985
Implantable Lead Implant Date: 20231002
Implantable Lead Location: 753862
Implantable Lead Model: 3501
Implantable Lead Serial Number: 221766
Implantable Pulse Generator Implant Date: 20231002
Pulse Gen Serial Number: 189685

## 2024-02-18 MED FILL — Continuous Glucose System Sensor: 30 days supply | Qty: 2 | Fill #0 | Status: AC

## 2024-02-21 ENCOUNTER — Other Ambulatory Visit: Payer: Self-pay

## 2024-02-21 ENCOUNTER — Emergency Department
Admission: EM | Admit: 2024-02-21 | Discharge: 2024-02-21 | Disposition: A | Discharge status: Home / Self Care | Attending: Emergency Medicine | Admitting: Emergency Medicine

## 2024-02-21 DIAGNOSIS — Z7901 Long term (current) use of anticoagulants: Secondary | ICD-10-CM | POA: Insufficient documentation

## 2024-02-21 DIAGNOSIS — Z4801 Encounter for change or removal of surgical wound dressing: Secondary | ICD-10-CM | POA: Insufficient documentation

## 2024-02-21 DIAGNOSIS — Z5189 Encounter for other specified aftercare: Secondary | ICD-10-CM

## 2024-02-21 NOTE — ED Triage Notes (Signed)
 Pt here asking for a wound check. Pt states he had surgery on Monday and everything was fine until yesterday when he woke up with blood on his face. Pt denies injury. Pt wants to make sure his wound is ok until his follow up appt. Pt ambulatory to triage.

## 2024-02-21 NOTE — Discharge Instructions (Signed)
 Wear the pressure dressing for the next 24 hours. Do not get it wet.   If you see any blood coming through, lay an ice pack over the bandage. If bleeding does not stop within 10-15 minutes, return to the ER.

## 2024-02-21 NOTE — ED Provider Notes (Signed)
   [facility] Provider Note    Event Date/Time   First MD Initiated Contact with Patient 02/21/24 [phone]     (approximate)   History   Wound Check   HPI  Gabriel [name]. is a 53 y.o. male with complex cardiac history on Xarelto  and as listed in EMR presents to the emergency department for wound check.  He had a cyst removed on his forehead on 02/16/2024.  He had been off of his blood thinner for the procedure but has since restarted it.  No bleeding until yesterday.  He reports soaking through 3 paper towels and then the area stopped bleeding.  He noted dried blood on the Band-Aid this morning which prompted him to come in for a wound check.  He has had no active bleeding today.     Physical Exam    Vitals:   02/21/24 0929  BP: (!) 170/96  Pulse: 92  Resp: 16  Temp: 98.1 F (36.7 C)  SpO2: 98%    General: Awake, no distress.  CV:  Good peripheral perfusion.  Resp:  Normal effort.  Abd:  No distention.  Other:  Surgical wound to the forehead with focal erythema and dried blood under what appears to be Dermabond.  No active bleeding.  No surrounding erythema or area of fluctuance.   ED Results / Procedures / Treatments   Labs (all labs ordered are listed, but only abnormal results are displayed)  Labs Reviewed - No data to display   EKG  Not indicated   RADIOLOGY  Image and radiology report reviewed and interpreted by me. Radiology report consistent with the same.  Not indicated  PROCEDURES:  Critical Care performed: No  Procedures   MEDICATIONS ORDERED IN ED:  Medications - No data to display   IMPRESSION / MDM / ASSESSMENT AND PLAN / ED COURSE   I have reviewed the triage note and vital signs. Vital signs indicate he is mildly hypertensive but is asymptomatic.   Differential diagnosis includes, but is not limited to, wound dehiscence, normal postoperative healing, bleeding secondary to Xarelto .   Patient's  presentation is most consistent with acute, uncomplicated illness.  53 year old male presenting to the emergency department for treatment and evaluation of surgical wound that bled yesterday.  See HPI for further details.  Wound is not actively bleeding.  It does appear that he had some blood ooze from underneath the Dermabond which has since dried.  He was advised that this is likely secondary to restarting his Xarelto .  He would feel better with a different type of dressing to try and prevent bleeding from starting again.  Nonadhesive dressing and Coban applied with slight pressure.  He was encouraged to leave this dressing in place for 24 hours.  If he notices any blood coming through the dressing he is to apply ice pack over the area.  If the bleeding has not stopped within about 15 minutes or so he is to return to the emergency department.       FINAL CLINICAL IMPRESSION(S) / ED DIAGNOSES   Final diagnoses:  Visit for wound check     Rx / DC Orders   ED Discharge Orders     None        Note:  This document was prepared using Dragon voice recognition software and may include unintentional dictation errors.   Herlinda Kirk NOVAK, FNP 02/21/24 1352    Arlander Charleston, MD 02/21/24 [phone]

## 2024-02-23 ENCOUNTER — Telehealth: Payer: Self-pay | Admitting: *Deleted

## 2024-02-23 DIAGNOSIS — Z794 Long term (current) use of insulin: Secondary | ICD-10-CM

## 2024-02-23 DIAGNOSIS — N1831 Chronic kidney disease, stage 3a: Secondary | ICD-10-CM

## 2024-02-23 DIAGNOSIS — I10 Essential (primary) hypertension: Secondary | ICD-10-CM

## 2024-02-23 DIAGNOSIS — J441 Chronic obstructive pulmonary disease with (acute) exacerbation: Secondary | ICD-10-CM

## 2024-02-23 NOTE — Progress Notes (Unsigned)
 Patient ID: Gabriel [name]., male   DOB: [birth year], 53 y.o.   MRN: [id number] Cardiology Office Note  Date:  02/24/2024   ID:  Gabriel [name]., DOB 04/09/1971, MRN [id number]  PCP:  [name] LABOR, FNP   Chief Complaint  Patient presents with   6 month follow up     Follow up prior to a cardiac ablation that scheduled for 04/16/2024 with Dr. Cindie. Patient c/o chest pain/dull ache at times with occasional shortness of breath.     HPI:  53 y.o. male with h/o  nonischemic cardiomyopathy by nuclear stress test in 2016, OSA, did not tolerate CPAP History of medication noncompliance History of chronic chest pain [facility] on 01/03/16 with cough and SOB, septic shock, right upper lobe pneumonia,  Left heart cath 01/06/2017  left ventricular ejection fraction  25-35% by visual estimate. chronic systolic CHF, Follow-up echocardiograms ejection fraction 30-35%, up to 45% - 50% with medical management COPD, long smoking history Prior runs of nonsustained VT, syncope DM2, med noncompliance, Morbid obesity Cath 05/2020: occluded RCA with collaterals He presents today for follow-up of his nonischemic cardiomyopathy, chronic systolic CHF, weakness, dehydration  Last seen by myself 2024 Last seen by EP January 2024 Followed by advanced heart failure clinic  In hospital 6/25 for systolic CHF: Cardiology was not consulted Discharged home metolazone  every other day torsemide  40 daily with spironolactone  weight down to 238 at discharge Since discharge, at home weight 243, here 242 Dry weight he feels should be 235  Denies significant leg edema, PND orthopnea Has not required NTG for chest pain  has run out of his Crestor , Tricor   Other past cardiac history reviewed Admitted 05/20/22 for ICD placement and discharged the next day. Was in the ED 12/21/22 due to dizziness & chest pain.    Echo 08/14/22: EF 40-45% with mild LVH, Grade I DD, severe hypokinesis of left ventricular, entire  inferior and inferolateral wall.  Echo 03/08/23: EF 45-50% with moderate LVH, trivial MR, mild dilatation of aortic root at 43 mm   RHC/ LHC 08/19/23:  Relatively stable appearance of the coronary arteries since last catheterization in 2021.  Chronically occluded proximal RCA now has antegrade flow, though I suspect this represents bridging collaterals.  Left-to-right collateral remain as well as moderate LAD and LCx disease. Mildly elevated left heart filling pressures (LVEDP 20 mmHg, PCWP 18 mmHg). Moderate pulmonary hypertension (mean PA 41 mmHg, PVR 5.3 WU). Moderately reduced Fick cardiac output/index (CO 4.3 L/min, CI 1.9 L/min/m^2).   Admitted 10/22/23 due to COPD exacerbation secondary to influenza.    Admitted 11/03/23 with dizziness, tachycardia and new onset AF. Echo 3/18 -- EF 35-40%, global LV hypokinesis, severely dilated LV internal cavity size, mild LVH, small pericardial effusion, mild MR. IV diuresed, GDMT restarted. Successful cardioversion 11/06/23. Amiodarone  loaded.   EKG personally reviewed by myself on todays visit EKG Interpretation Date/Time:  Tuesday February 24 2024 08:24:03 EDT Ventricular Rate:  81 PR Interval:  166 QRS Duration:  108 QT Interval:  418 QTC Calculation: 485 R Axis:   16  Text Interpretation: Normal sinus rhythm Prolonged QT When compared with ECG of 06-Feb-2024 11:05, Questionable change in QRS axis Confirmed by Perla Lye [phone]) on 02/24/2024 8:27:40 AM   PMH:   has a past medical history of Anginal pain (HCC), Aortic atherosclerosis (HCC), Aortic root dilation (HCC) (11/28/2016), Arteriosclerosis of right carotid artery, Atrial fibrillation with RVR (HCC), CAD (coronary artery disease), Cardiomegaly, Cerebellar infarct (HCC), Chest wall pain,  chronic, Chronic 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, Chronic Troponin Elevation, CKD (chronic kidney disease), stage II-III, COPD (chronic obstructive pulmonary disease) (HCC), CPAP (continuous positive airway  pressure) dependence (03/08/2023), Depression, DM (diabetes mellitus), type 2 (HCC), Dyspnea, GERD (gastroesophageal reflux disease), Headache, HFrEF (heart failure with reduced ejection fraction) (HCC), History of 2019 novel coronavirus disease (COVID-19) (03/01/2022), History of kidney stones, History of methicillin resistant staphylococcus aureus (MRSA), Hyperlipidemia, Hypertension, 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), Left rotator cuff tear, Liver cirrhosis secondary to NASH (HCC), Long term current use of amiodarone , Long term current use of immunosuppressive drug, Long-term use of aspirin  therapy, Mediastinal lymphadenopathy, Mitral regurgitation, Mixed Ischemic & NICM (nonischemic cardiomyopathy) (HCC), NASH (nonalcoholic steatohepatitis), NSTEMI (non-ST elevated myocardial infarction) (HCC) (12/2016), NSVT (nonsustained ventricular tachycardia) (HCC), Obesity (BMI 30.0-34.9), On rivaroxaban  therapy, OSA treated with BiPAP, Pneumonia, Polyp of descending colon, Psoriasis, Psoriatic arthritis (HCC), Pulmonary HTN (HCC) (02/29/2016), 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  PSH:    Past Surgical History:  Procedure Laterality Date   CARDIOVERSION N/A 11/06/2023   Procedure: CARDIOVERSION;  Surgeon: Zenaida Morene PARAS, MD;  Location: [facility] ORS;  Service: Cardiovascular;  Laterality: N/A;   cardioverted 11/06/23     COLONOSCOPY WITH PROPOFOL  N/A 08/01/2022   Procedure: COLONOSCOPY WITH PROPOFOL ;  Surgeon: Unk Corinn Skiff, MD;  Location: [facility] ENDOSCOPY;  Service: Gastroenterology;  Laterality: N/A;   ESOPHAGOGASTRODUODENOSCOPY (EGD) WITH PROPOFOL  N/A 08/01/2022   Procedure: ESOPHAGOGASTRODUODENOSCOPY (EGD) WITH PROPOFOL ;  Surgeon: Unk Corinn Skiff, MD;  Location[facility];  Service: Gastroenterology;  Laterality: N/A;   ESOPHAGOGASTRODUODENOSCOPY (EGD) WITH PROPOFOL  N/A  10/16/2022   Procedure: ESOPHAGOGASTRODUODENOSCOPY (EGD) WITH PROPOFOL ;  Surgeon: Unk Corinn Skiff, MD;  Location: [facility] ENDOSCOPY;  Service: Gastroenterology;  Laterality: N/A;   FINGER AMPUTATION Left    Traumatic   LEFT HEART CATH AND CORONARY ANGIOGRAPHY N/A 01/06/2017   Procedure: Left Heart Cath and Coronary Angiography;  Surgeon: Darron Deatrice LABOR, MD;  Location: [facility] INVASIVE CV LAB;  Service: Cardiovascular;  Laterality: N/A;   LESION EXCISION N/A 02/16/2024   Procedure: EXCISION, LESION, SCALP;  Surgeon: Rodolph Romano, MD;  Location: [facility] ORS;  Service: General;  Laterality: N/A;   RIGHT/LEFT HEART CATH AND CORONARY ANGIOGRAPHY N/A 06/13/2020   Procedure: RIGHT/LEFT HEART CATH AND CORONARY ANGIOGRAPHY;  Surgeon: Mady Bruckner, MD;  Location: [facility] INVASIVE CV LAB;  Service: Cardiovascular;  Laterality: N/A;   RIGHT/LEFT HEART CATH AND CORONARY ANGIOGRAPHY Bilateral 08/19/2023   Procedure: RIGHT/LEFT HEART CATH AND CORONARY ANGIOGRAPHY;  Surgeon: Mady Bruckner, MD;  Location: [facility] INVASIVE CV LAB;  Service: Cardiovascular;  Laterality: Bilateral;   SHOULDER ARTHROSCOPY WITH SUBACROMIAL DECOMPRESSION, ROTATOR CUFF REPAIR AND BICEP TENDON REPAIR Left 09/16/2023   Procedure: SHOULDER ARTHROSCOPY WITH DEBRIDEMENT, DECOMPRESSION, ROTATOR CUFF REPAIR AND BICEPS TENODESIS.;  Surgeon: [name] Norleen PARAS, MD;  Location: [facility] ORS;  Service: Orthopedics;  Laterality: Left;   SUBQ ICD IMPLANT N/A 05/20/2022   Procedure: SUBQ ICD IMPLANT;  Surgeon: Cindie Ole DASEN, MD;  Location: [facility] INVASIVE CV LAB;  Service: Cardiovascular;  Laterality: N/A;    Current Outpatient Medications  Medication Sig Dispense Refill   Accu-Chek Softclix Lancets lancets USE UP TO FOUR TIMES DAILY     albuterol  (VENTOLIN  HFA) 108 (90 Base) MCG/ACT inhaler Inhale 2 puffs into the lungs every 4 (four) hours as needed for wheezing or shortness of breath. 8 g 6   allopurinol  (ZYLOPRIM ) 300 MG tablet Take 300 mg by  mouth every morning.     amiodarone  (  PACERONE ) 200 MG tablet Take 1 tablet (200 mg total) by mouth daily. 90 tablet 3   aspirin  EC 81 MG tablet Take 81 mg by mouth every morning.     azelastine  (ASTELIN ) 0.1 % nasal spray Place 2 sprays into both nostrils 2 (two) times daily as needed. Use in each nostril as directed     BD PEN NEEDLE NANO 2ND GEN 32G X 4 MM MISC USE  AS DIRECTED AT BEDTIME 200 each 0   blood glucose meter kit and supplies KIT Dispense based on patient and insurance preference. Use up to four times daily as directed. 1 each 11   butalbital -acetaminophen -caffeine  (FIORICET ) 50-325-40 MG tablet Take 1 tablet by mouth every 4 (four) hours as needed for migraine.     carvedilol  (COREG ) 6.25 MG tablet Take 1 tablet (6.25 mg total) by mouth 2 (two) times daily. 60 tablet 11   cetirizine  (ZYRTEC ) 10 MG tablet Take 1 tablet (10 mg total) by mouth daily as needed for allergies.     Continuous Glucose Sensor (FREESTYLE LIBRE 3 PLUS SENSOR) MISC Place 1 sensor on the skin every 15 days. Use to check glucose continuously 2 each 12   dapagliflozin  propanediol (FARXIGA ) 10 MG TABS tablet Take 1 tablet (10 mg total) by mouth daily. 90 tablet 3   fenofibrate  (TRICOR ) 145 MG tablet Take 1 tablet (145 mg total) by mouth every evening.     fluticasone  (FLONASE ) 50 MCG/ACT nasal spray Place 2 sprays into both nostrils daily as needed for allergies or rhinitis. Home med.     Insulin  Glargine (BASAGLAR  KWIKPEN) 100 UNIT/ML Inject 36 Units into the skin at bedtime. 45 mL 3   insulin  lispro (HUMALOG ) 100 UNIT/ML injection Inject 10 Units into the skin 3 (three) times daily before meals.     Insulin  Pen Needle 32G X 4 MM MISC 1 each by Does not apply route in the morning, at noon, in the evening, and at bedtime. 120 each 11   ipratropium-albuterol  (DUONEB) 0.5-2.5 (3) MG/3ML SOLN Take 3 mLs by nebulization every 6 (six) hours as needed. 360 mL 11   meclizine  (ANTIVERT ) 25 MG tablet Take 0.5 tablets (12.5 mg  total) by mouth 3 (three) times daily as needed for dizziness.     metolazone  (ZAROXOLYN ) 5 MG tablet Take 1 tablet (5 mg total) by mouth every other day. 15 tablet 0   omeprazole  (PRILOSEC) 40 MG capsule Take 1 capsule (40 mg total) by mouth every morning.     potassium chloride  SA (KLOR-CON  M) 20 MEQ tablet Take 1 tablet (20 mEq total) by mouth every other day. AM     Resmetirom  (REZDIFFRA ) 100 MG TABS TAKE 1 TABLET BY MOUTH DAILY WITH OR WITHOUT FOOD 30 tablet 1   rivaroxaban  (XARELTO ) 20 MG TABS tablet Take 1 tablet (20 mg total) by mouth daily with supper. 90 tablet 3   rosuvastatin  (CRESTOR ) 20 MG tablet Take 1 tablet (20 mg total) by mouth at bedtime.     sacubitril -valsartan  (ENTRESTO ) 24-26 MG Take 1 tablet by mouth 2 (two) times daily. 180 tablet 1   Semaglutide , 2 MG/DOSE, (OZEMPIC , 2 MG/DOSE,) 8 MG/3ML SOPN Inject 2 mg into the skin once a week. Tuesdays 3 mL 2   spironolactone  (ALDACTONE ) 25 MG tablet Take 1 tablet by mouth once daily 90 tablet 1   TALTZ 80 MG/ML SOAJ Inject 3 mLs into the skin every 28 (twenty-eight) days.     Torsemide  40 MG TABS Take 40 mg by  mouth daily. 30 tablet 0   nitroGLYCERIN  (NITROSTAT ) 0.4 MG SL tablet Place 1 tablet (0.4 mg total) under the tongue every 5 (five) minutes x 3 doses as needed for chest pain. 30 tablet 3   No current facility-administered medications for this visit.    Allergies:   Metformin  and related and Zantac  [ranitidine  hcl]   Social History:  The patient  reports that he has been smoking Cigarettes.  He has been smoking about 2.00 packs per day for the past 33 years. He has never used smokeless tobacco. He reports that he drinks about 1.8 oz of alcohol per week. He reports that he does not use illicit drugs.   Family History:   family history includes Cancer in his maternal grandfather and paternal aunt; Diabetes in his maternal grandfather and mother; Diabetes Mellitus II in his mother; Gout in his father; Heart attack in his  mother; Hypertension in his father and mother; Hypothyroidism in his mother; Kidney failure in his mother.   Review of Systems: Review of Systems  Constitutional: Negative.        Abdominal distention  HENT: Negative.    Respiratory: Negative.    Cardiovascular: Negative.   Gastrointestinal: Negative.   Musculoskeletal: Negative.   Psychiatric/Behavioral: Negative.    All other systems reviewed and are negative.   PHYSICAL EXAM: VS:  BP 120/80 (BP Location: Left Arm, Patient Position: Sitting, Cuff Size: Normal)   Pulse 81   Ht 5' 11 (1.803 m)   Wt 242 lb 6 oz (109.9 kg)   SpO2 95%   BMI 33.80 kg/m  , BMI Body mass index is 33.8 kg/m. Constitutional:  oriented to person, place, and time. No distress.  HENT:  Head: Grossly normal Eyes:  no discharge. No scleral icterus.  Neck: No JVD, no carotid bruits  Cardiovascular: Regular rate and rhythm, no murmurs appreciated Pulmonary/Chest: Clear to auscultation bilaterally, no wheezes or rails Abdominal: Soft.  no distension.  no tenderness.  Musculoskeletal: Normal range of motion Neurological:  normal muscle tone. Coordination normal. No atrophy Skin: Skin warm and dry Psychiatric: normal affect, pleasant  Recent Labs: 11/03/2023: TSH 0.649 01/01/2024: ALT 28 02/06/2024: B Natriuretic Peptide 607.5; Magnesium  1.9 02/07/2024: BUN 30; Creatinine, Ser 1.17; Hemoglobin 17.1; Platelets 137; Potassium 3.8; Sodium 139    Lipid Panel Lab Results  Component Value Date   CHOL 180 01/01/2024   HDL 29 (L) 01/01/2024   LDLCALC 94 01/01/2024   TRIG 341 (H) 01/01/2024      Wt Readings from Last 3 Encounters:  02/24/24 242 lb 6 oz (109.9 kg)  02/16/24 240 lb (108.9 kg)  02/09/24 238 lb 1.6 oz (108 kg)     ASSESSMENT AND PLAN:  Cardiomyopathy (HCC) - Mixed ischemic and nonischemic Prior history of medication noncompliance,  Taking metolazone  every other day as recommended by hospitalist on recent discharge with torsemide  40  daily spironolactone  25 daily Catheterization 2024 showing occluded RCA with collaterals from left to right -BMP ordered today given he is on metolazone  every other day.  We will hold off on refill until lab work available  Essential hypertension Blood pressure is well controlled on today's visit. No changes made to the medications.  Chronic systolic heart failure (HCC) Euvolemic, continue Coreg  3.125 twice daily, Farxiga , torsemide  60 alternating with 40, spironolactone , Entresto  Goal weight low 240 or less  coronary artery disease without angina RCA occlusion with collaterals from left to right 2024 Long history of chronic chest pain No further ischemic workup  at this time  Morbid obesity We have encouraged continued exercise, careful diet management in an effort to lose weight. Weight stable if not trending down on Ozempic     Orders Placed This Encounter  Procedures   EKG 12-Lead     Signed, Velinda Lunger, M.D., Ph.D. 02/24/2024  [facility] [place], [place] [phone]

## 2024-02-23 NOTE — Progress Notes (Signed)
 Complex Care Management Note  Care Guide Note 02/23/2024 Name: Izek Corvino. MRN: [id number] DOB: 02/02/1971  [name] is a 53 y.o. year old male who sees [name] LABOR, FNP for primary care. I reached out to [employer]. by phone today to offer complex care management services.  Mr. Savoca was given information about Complex Care Management services today including:   The Complex Care Management services include support from the care team which includes your Nurse Care Manager, Clinical Social Worker, or Pharmacist.  The Complex Care Management team is here to help remove barriers to the health concerns and goals most important to you. Complex Care Management services are voluntary, and the patient may decline or stop services at any time by request to their care team member.   Complex Care Management Consent Status: Patient agreed to services and verbal consent obtained.   Follow up plan:  Telephone appointment with complex care management team member scheduled for:  03/12/24  Encounter Outcome:  Patient Scheduled  Harlene Satterfield  [facility]  [facility] Guide  Direct Dial : [phone]  Fax [phone]

## 2024-02-24 ENCOUNTER — Encounter: Payer: Self-pay | Admitting: Cardiovascular Disease

## 2024-02-24 ENCOUNTER — Telehealth: Payer: Self-pay | Admitting: Pharmacy Technician

## 2024-02-24 ENCOUNTER — Ambulatory Visit: Attending: Cardiovascular Disease | Admitting: Cardiovascular Disease

## 2024-02-24 ENCOUNTER — Ambulatory Visit: Admitting: Dietician

## 2024-02-24 VITALS — BP 120/80 | HR 81 | Ht 71.0 in | Wt 242.4 lb

## 2024-02-24 DIAGNOSIS — I25110 Atherosclerotic heart disease of native coronary artery with unstable angina pectoris: Secondary | ICD-10-CM | POA: Diagnosis not present

## 2024-02-24 DIAGNOSIS — N1832 Chronic kidney disease, stage 3b: Secondary | ICD-10-CM | POA: Diagnosis not present

## 2024-02-24 DIAGNOSIS — Z794 Long term (current) use of insulin: Secondary | ICD-10-CM | POA: Insufficient documentation

## 2024-02-24 DIAGNOSIS — I4819 Other persistent atrial fibrillation: Secondary | ICD-10-CM | POA: Diagnosis not present

## 2024-02-24 DIAGNOSIS — R002 Palpitations: Secondary | ICD-10-CM | POA: Diagnosis not present

## 2024-02-24 DIAGNOSIS — I10 Essential (primary) hypertension: Secondary | ICD-10-CM | POA: Insufficient documentation

## 2024-02-24 DIAGNOSIS — I5022 Chronic systolic (congestive) heart failure: Secondary | ICD-10-CM | POA: Insufficient documentation

## 2024-02-24 DIAGNOSIS — I2699 Other pulmonary embolism without acute cor pulmonale: Secondary | ICD-10-CM | POA: Diagnosis not present

## 2024-02-24 DIAGNOSIS — E1122 Type 2 diabetes mellitus with diabetic chronic kidney disease: Secondary | ICD-10-CM | POA: Diagnosis not present

## 2024-02-24 DIAGNOSIS — G4733 Obstructive sleep apnea (adult) (pediatric): Secondary | ICD-10-CM | POA: Diagnosis not present

## 2024-02-24 DIAGNOSIS — Z79899 Other long term (current) drug therapy: Secondary | ICD-10-CM | POA: Insufficient documentation

## 2024-02-24 DIAGNOSIS — E782 Mixed hyperlipidemia: Secondary | ICD-10-CM | POA: Diagnosis not present

## 2024-02-24 DIAGNOSIS — I480 Paroxysmal atrial fibrillation: Secondary | ICD-10-CM | POA: Insufficient documentation

## 2024-02-24 DIAGNOSIS — I2510 Atherosclerotic heart disease of native coronary artery without angina pectoris: Secondary | ICD-10-CM | POA: Insufficient documentation

## 2024-02-24 MED ORDER — POTASSIUM CHLORIDE CRYS ER 20 MEQ PO TBCR: 20.0000 meq | EXTENDED_RELEASE_TABLET | ORAL | 3 refills | Status: DC

## 2024-02-24 MED ORDER — FENOFIBRATE 145 MG PO TABS: 145.0000 mg | ORAL_TABLET | Freq: Every evening | ORAL | 3 refills | Status: DC

## 2024-02-24 MED ORDER — TORSEMIDE 40 MG PO TABS: 40.0000 mg | ORAL_TABLET | Freq: Every day | ORAL | 3 refills | Status: DC

## 2024-02-24 MED ORDER — ROSUVASTATIN CALCIUM 20 MG PO TABS: 20.0000 mg | ORAL_TABLET | Freq: Every evening | ORAL | 3 refills | Status: DC

## 2024-02-24 MED ORDER — NITROGLYCERIN 0.4 MG SL SUBL: 0.4000 mg | SUBLINGUAL_TABLET | SUBLINGUAL | 3 refills | Status: AC | PRN

## 2024-02-24 MED ORDER — REPATHA SURECLICK 140 MG/ML ~~LOC~~ SOAJ: 140.0000 mg | SUBCUTANEOUS | 3 refills | Status: DC

## 2024-02-24 NOTE — Telephone Encounter (Signed)
 Pharmacy Patient Advocate Encounter   Received notification from Onbase that prior authorization for repatha  is required/requested.   Insurance verification completed.   The patient is insured through [facility] .   Per test claim: PA required; PA submitted to above mentioned insurance via CoverMyMeds Key/confirmation #/EOC [id number] Status is pending

## 2024-02-24 NOTE — Patient Instructions (Addendum)
 Medication Instructions:  Repatha  140 sq every 2 weeks   If you need a refill on your cardiac medications before your next appointment, please call your pharmacy.   Lab work:  [employer] today  Testing/Procedures: No new testing needed  Follow-Up: At [facility], you and your health needs are our priority.  As part of our continuing mission to provide you with exceptional heart care, we have created designated Provider Care Teams.  These Care Teams include your primary Cardiologist (physician) and Advanced Practice Providers (APPs -  Physician Assistants and Nurse Practitioners) who all work together to provide you with the care you need, when you need it.  You will need a follow up appointment in 6 months  Providers on your designated Care Team:   Lonni Meager, NP Bernardino Bring, PA-C Cadence [place]ster, [place]  COVID-19 Vaccine Information can be found at: [email or web address] For questions related to vaccine distribution or appointments, please email [email or web address] or call [phone].

## 2024-02-24 NOTE — Telephone Encounter (Signed)
 Pharmacy Patient Advocate Encounter  Received notification from [facility] that Prior Authorization for repatha  has been DENIED.  Full denial letter will be uploaded to the media tab. See denial reason below.  I see a note saying the crestor  had to be lowered from 40mg  to 20mg  on 05/15/23 but it doesn't say why.

## 2024-02-25 ENCOUNTER — Other Ambulatory Visit: Payer: Self-pay | Admitting: Family Medicine

## 2024-02-25 ENCOUNTER — Telehealth: Payer: Self-pay | Admitting: Pharmacist

## 2024-02-25 ENCOUNTER — Other Ambulatory Visit: Payer: Self-pay | Admitting: Pharmacist

## 2024-02-25 DIAGNOSIS — M1A00X0 Idiopathic chronic gout, unspecified site, without tophus (tophi): Secondary | ICD-10-CM | POA: Diagnosis not present

## 2024-02-25 DIAGNOSIS — E1169 Type 2 diabetes mellitus with other specified complication: Secondary | ICD-10-CM

## 2024-02-25 DIAGNOSIS — Z79899 Other long term (current) drug therapy: Secondary | ICD-10-CM | POA: Diagnosis not present

## 2024-02-25 DIAGNOSIS — Z794 Long term (current) use of insulin: Secondary | ICD-10-CM

## 2024-02-25 DIAGNOSIS — L4050 Arthropathic psoriasis, unspecified: Secondary | ICD-10-CM | POA: Diagnosis not present

## 2024-02-25 LAB — BASIC METABOLIC PANEL WITH GFR
BUN/Creatinine Ratio: 14 (ref 9–20)
BUN: 21 mg/dL (ref 6–24)
CO2: 19 mmol/L — ABNORMAL LOW (ref 20–29)
Calcium: 10.2 mg/dL (ref 8.7–10.2)
Chloride: 99 mmol/L (ref 96–106)
Creatinine, Ser: 1.5 mg/dL — ABNORMAL HIGH (ref 0.76–1.27)
Glucose: 223 mg/dL — ABNORMAL HIGH (ref 70–99)
Potassium: 4.5 mmol/L (ref 3.5–5.2)
Sodium: 141 mmol/L (ref 134–144)
eGFR: 55 mL/min/1.73 — ABNORMAL LOW (ref >59)

## 2024-02-25 MED ORDER — NOVOLOG FLEXPEN RELION 100 UNIT/ML ~~LOC~~ SOPN
10.0000 [IU] | PEN_INJECTOR | Freq: Three times a day (TID) | SUBCUTANEOUS | 3 refills | Status: DC
Start: 2024-02-25 — End: 2024-03-03

## 2024-02-25 NOTE — Telephone Encounter (Signed)
 filled

## 2024-02-25 NOTE — Progress Notes (Signed)
 Patient needing refill of his Novolog  Flexpen Rx as current prescription is out of refills.  Would you please send to [facility] for patient?  Thank you!

## 2024-02-25 NOTE — Patient Instructions (Signed)
 Goals Addressed             This Visit's Progress    Pharmacy Goals       Our goal A1c is less than 7%. This corresponds with fasting sugars less than 130 and 2 hour after meal sugars less than 180. Please continue to use Freestyle Libre 3 Continuous Glucose monitor for blood sugar monitoring.  Check your blood pressure once daily, and any time you have concerning symptoms like headache, chest pain, dizziness, shortness of breath, or vision changes.    To appropriately check your blood pressure, make sure you do the following:  1) Avoid caffeine, exercise, or tobacco products for 30 minutes before checking. Empty your bladder. 2) Sit with your back supported in a flat-backed chair. Rest your arm on something flat (arm of the chair, table, etc). 3) Sit still with your feet flat on the floor, resting, for at least 5 minutes.  4) Check your blood pressure. Take 1-2 readings.  5) Write down these readings and bring with you to any provider appointments.  Bring your home blood pressure machine with you to a provider's office for accuracy comparison at least once a year.   Make sure you take your blood pressure medications before you come to any office visit, even if you were asked to fast for labs.   Estelle Grumbles, PharmD, [facility] [phone]

## 2024-02-25 NOTE — Progress Notes (Signed)
 02/25/2024 Name: Gabriel [name]. MRN: [id number] DOB: 10-Jun-1971  Chief Complaint  Patient presents with   Medication Management    Burel Toa Mia. is a 53 y.o. year old male who presented for a telephone visit.   They were referred to the pharmacist by their PCP for assistance in managing diabetes.      Subjective:   Care Team: Primary Care Provider: [name] LABOR, FNP; Next Scheduled Visit: 03/03/2024 Rheumatologist: Defoor, Elsie Conch, PA Pulmonologist: Isadora Hose, MD Heart Failure Specialist: Donette Ellouise LABOR, FNP Gastroenterologist: Unk Corinn Skiff, MD Cardiologist: Perla Evalene PARAS, MD Electrophysiologist: Cindie Ole DASEN, MD; Next Scheduled Visit: 03/17/2024 Orthopedic Specialist: [name] Norleen Purchase, MD Nurse Care Manager: Devra Lands, RN; Initial Scheduled Visit: 03/12/2024 Dietitian: Katheleen Aleene ORN, RD; Initial Scheduled Visit: 04/06/2024    Medication Access/Adherence  Current Pharmacy:  [facility] [address]), [place] - [address] [place]) [place] [address] Phone: [phone] Fax: [phone]  [facility] - [place], [place] - [address] [place] [address] Phone: [phone] Fax: [phone]  [facility] [place]  - [place], [place] - [address] [place] [address] Phone: [phone] Fax: [phone]  [facility] [address] [place] [address] Phone: [phone] Fax: [phone]   Patient reports affordability concerns with their medications: No  Patient reports access/transportation concerns to their pharmacy: No  Patient reports adherence concerns with their medications:  No     From review of chart, note patient had procedure for removal from cyst on forehead on 02/16/2024  - Followed  up at [facility] ED on 7/5 for wound check  Note patient admitted to [facility] on 02/06/2024 related to acute on chronic combined systolic and diastolic CHF. Discharged on 02/07/2024   Based review of dispensing record/call to [facility], query adherence to Novolog  as appears this prescription last filled 06/01/2023 for quantity of 15 mL    Diabetes:   Current medications:  - Ozempic  2 mg weekly on Tuesdays, Basaglar  36 units daily, Novolog  10 units base + 1 unit correction for every 50 mg/dL above 849 prior to meals; Farxiga  10 mg daily   Medications tried in the past: metformin  - GI intolerance;    Patient uses Freestyle Libre 3 Plus Continuous Glucose Monitor   Review patient's last 14-days of data from LibreView:   Date of Download: 02/25/24 % Time CGM is active: 82% Average Glucose: 233 mg/dL Glucose Management Indicator: 8.9%  Glucose Variability: 20.2% (goal <36%) Time in Goal:  - Time in range 70-180: 9% - Time above range: 91% - Time below range: 0%   Today patient shares that his blood sugar readings have been elevated over the past several weeks. Denies missed doses of his medications at home. Note patient has had recent procedure for removal of cyst on forehead on 6/30 and admission related to CHF 6/20-6/21.  Current Dietary Habits - Breakfast: 1-2 eggs, 1 piece of sausage + 1-2 pieces of bread or bowl of cereal - Lunch: salad or chicken or [place] sandwich with a few potato chips or pretzels - Supper: baked pork chop sandwich - Snack: grapes and watermelon or unsalted mixed nuts - drinks: water or unsweetened tea   Patient denies recent symptoms of hypoglycemia     Objective:  Lab Results  Component Value Date   HGBA1C 6.9 (  H) 01/01/2024    Lab Results  Component Value Date   CREATININE 1.50 (H) 02/24/2024   BUN 21 02/24/2024   NA 141 02/24/2024   K 4.5 02/24/2024   CL 99 02/24/2024   CO2 19 (L) 02/24/2024     Current  Outpatient Medications on File Prior to Visit  Medication Sig Dispense Refill   dapagliflozin  propanediol (FARXIGA ) 10 MG TABS tablet Take 1 tablet (10 mg total) by mouth daily. 90 tablet 3   Insulin  Glargine (BASAGLAR  KWIKPEN) 100 UNIT/ML Inject 36 Units into the skin at bedtime. 45 mL 3   Semaglutide , 2 MG/DOSE, (OZEMPIC , 2 MG/DOSE,) 8 MG/3ML SOPN Inject 2 mg into the skin once a week. Tuesdays 3 mL 2   Accu-Chek Softclix Lancets lancets USE UP TO FOUR TIMES DAILY     albuterol  (VENTOLIN  HFA) 108 (90 Base) MCG/ACT inhaler Inhale 2 puffs into the lungs every 4 (four) hours as needed for wheezing or shortness of breath. 8 g 6   allopurinol  (ZYLOPRIM ) 300 MG tablet Take 300 mg by mouth every morning.     amiodarone  (PACERONE ) 200 MG tablet Take 1 tablet (200 mg total) by mouth daily. 90 tablet 3   aspirin  EC 81 MG tablet Take 81 mg by mouth every morning.     azelastine  (ASTELIN ) 0.1 % nasal spray Place 2 sprays into both nostrils 2 (two) times daily as needed. Use in each nostril as directed     BD PEN NEEDLE NANO 2ND GEN 32G X 4 MM MISC USE  AS DIRECTED AT BEDTIME 200 each 0   blood glucose meter kit and supplies KIT Dispense based on patient and insurance preference. Use up to four times daily as directed. 1 each 11   butalbital -acetaminophen -caffeine  (FIORICET ) 50-325-40 MG tablet Take 1 tablet by mouth every 4 (four) hours as needed for migraine.     carvedilol  (COREG ) 6.25 MG tablet Take 1 tablet (6.25 mg total) by mouth 2 (two) times daily. 60 tablet 11   cetirizine  (ZYRTEC ) 10 MG tablet Take 1 tablet (10 mg total) by mouth daily as needed for allergies.     Continuous Glucose Sensor (FREESTYLE LIBRE 3 PLUS SENSOR) MISC Place 1 sensor on the skin every 15 days. Use to check glucose continuously 2 each 12   Evolocumab  (REPATHA  SURECLICK) 140 MG/ML SOAJ Inject 140 mg into the skin every 14 (fourteen) days. 6 mL 3   fenofibrate  (TRICOR ) 145 MG tablet Take 1 tablet (145 mg total) by mouth every  evening. 90 tablet 3   fluticasone  (FLONASE ) 50 MCG/ACT nasal spray Place 2 sprays into both nostrils daily as needed for allergies or rhinitis. Home med.     insulin  lispro (HUMALOG ) 100 UNIT/ML injection Inject 10 Units into the skin 3 (three) times daily before meals.     Insulin  Pen Needle 32G X 4 MM MISC 1 each by Does not apply route in the morning, at noon, in the evening, and at bedtime. 120 each 11   ipratropium-albuterol  (DUONEB) 0.5-2.5 (3) MG/3ML SOLN Take 3 mLs by nebulization every 6 (six) hours as needed. 360 mL 11   meclizine  (ANTIVERT ) 25 MG tablet Take 0.5 tablets (12.5 mg total) by mouth 3 (three) times daily as needed for dizziness.     metolazone  (ZAROXOLYN ) 5 MG tablet Take 1 tablet (5 mg total) by mouth every other day. 15 tablet 0   nitroGLYCERIN  (NITROSTAT ) 0.4 MG SL tablet Place 1 tablet (0.4 mg total) under the tongue every 5 (five)  minutes x 3 doses as needed for chest pain. 30 tablet 3   omeprazole  (PRILOSEC) 40 MG capsule Take 1 capsule (40 mg total) by mouth every morning.     potassium chloride  SA (KLOR-CON  M) 20 MEQ tablet Take 1 tablet (20 mEq total) by mouth every other day. AM 45 tablet 3   Resmetirom  (REZDIFFRA ) 100 MG TABS TAKE 1 TABLET BY MOUTH DAILY WITH OR WITHOUT FOOD 30 tablet 1   rivaroxaban  (XARELTO ) 20 MG TABS tablet Take 1 tablet (20 mg total) by mouth daily with supper. 90 tablet 3   rosuvastatin  (CRESTOR ) 20 MG tablet Take 1 tablet (20 mg total) by mouth at bedtime. 90 tablet 3   sacubitril -valsartan  (ENTRESTO ) 24-26 MG Take 1 tablet by mouth 2 (two) times daily. 180 tablet 1   spironolactone  (ALDACTONE ) 25 MG tablet Take 1 tablet by mouth once daily 90 tablet 1   TALTZ 80 MG/ML SOAJ Inject 3 mLs into the skin every 28 (twenty-eight) days.     Torsemide  40 MG TABS Take 40 mg by mouth daily. 90 tablet 3   No current facility-administered medications on file prior to visit.        Assessment/Plan:     Diabetes: - Reviewed long term  cardiovascular and renal outcomes of uncontrolled blood sugar - Have counseled patient on s/s of low blood sugar and how to treat lows Encouraged patient to obtain glucose tablets to carry with him - Reviewed dietary modifications including importance of having regular well-balanced meals and snacks throughout the day, while controlling carbohydrate portion sizes Counsel patient on balanced snack ideas. Advise against eating large portions of grapes and watermelon             Encourage patient to increase consumption of non-starchy vegetables - Counsel patient on importance of medication adherence. In particular, encourage patient to ensure adherence to using Novolog  consistently with meals - Send message to PCP requesting refill of Novolog  as current Rx is out of refills - Counsel patient that once opened, Novolog  and Basaglar  pens should be discarded after 28 days - Will collaborate with PCP regarding patient's recent CGM results. Note patient has an upcoming appointment with provider on 03/03/2024 - Recommend to continue to use Freestyle Libre 3 CGM to monitor blood sugar/as feedback on dietary choices Recommend to check glucose with fingerstick check when needed for symptoms and as back up to CGM.  Patient to contact office if needed for readings outside of established parameters or symptoms   Follow Up Plan: Clinical Pharmacist will follow up with patient by telephone again next month     Sharyle Sia, PharmD, [facility] [phone]

## 2024-02-26 NOTE — Telephone Encounter (Signed)
 Called patient to inform him that his Novolog  Flexpen was sent to [facility] pharmacy, he verbalized understanding.

## 2024-02-27 MED ORDER — ROSUVASTATIN CALCIUM 40 MG PO TABS: 40.0000 mg | ORAL_TABLET | Freq: Every evening | ORAL | 3 refills | Status: DC

## 2024-02-27 MED ORDER — EZETIMIBE 10 MG PO TABS: 10.0000 mg | ORAL_TABLET | Freq: Every day | ORAL | 3 refills | Status: AC

## 2024-02-27 NOTE — Telephone Encounter (Signed)
 Called patient and notified him of the following from Dr. Gollan.  Can we recommend that he increase his Crestor  up to 40 He was on this dose last year, looks like he is only on 20 daily Also need to add Zetia  10 daily Thx TGollan  Patient verbalized understanding. Prescriptions sent to preferred pharmacy.

## 2024-02-27 NOTE — Addendum Note (Signed)
 Addended by: CRISTOPHER OLIVIA PARAS on: 02/27/2024 08:39 AM   Modules accepted: Orders

## 2024-02-28 ENCOUNTER — Ambulatory Visit: Payer: Self-pay | Admitting: Cardiology

## 2024-02-29 ENCOUNTER — Other Ambulatory Visit: Payer: Self-pay | Admitting: Family

## 2024-02-29 ENCOUNTER — Ambulatory Visit: Payer: Self-pay | Admitting: Cardiovascular Disease

## 2024-02-29 DIAGNOSIS — I25118 Atherosclerotic heart disease of native coronary artery with other forms of angina pectoris: Secondary | ICD-10-CM

## 2024-03-03 ENCOUNTER — Ambulatory Visit: Admitting: Family Medicine

## 2024-03-03 ENCOUNTER — Encounter: Payer: Self-pay | Admitting: Family Medicine

## 2024-03-03 VITALS — BP 109/75 | HR 85 | Temp 98.4°F | Ht 71.0 in | Wt 243.4 lb

## 2024-03-03 DIAGNOSIS — E785 Hyperlipidemia, unspecified: Secondary | ICD-10-CM

## 2024-03-03 DIAGNOSIS — Z794 Long term (current) use of insulin: Secondary | ICD-10-CM

## 2024-03-03 DIAGNOSIS — J302 Other seasonal allergic rhinitis: Secondary | ICD-10-CM | POA: Diagnosis not present

## 2024-03-03 DIAGNOSIS — E1169 Type 2 diabetes mellitus with other specified complication: Secondary | ICD-10-CM | POA: Diagnosis not present

## 2024-03-03 MED ORDER — NOVOLOG FLEXPEN RELION 100 UNIT/ML ~~LOC~~ SOPN: 12.0000 [IU] | PEN_INJECTOR | Freq: Three times a day (TID) | SUBCUTANEOUS | 3 refills | Status: DC

## 2024-03-03 MED ORDER — CETIRIZINE HCL 10 MG PO TABS: 10.0000 mg | ORAL_TABLET | Freq: Every day | ORAL | Status: DC | PRN

## 2024-03-03 NOTE — Progress Notes (Signed)
 Established Patient Office Visit  Introduced to nurse practitioner role and practice setting.  All questions answered.  Discussed provider/patient relationship and expectations.  Subjective   Patient ID: Gabriel Klauer., male    DOB: 1970/11/24  Age: 53 y.o. MRN: [id number]  Chief Complaint  Patient presents with   Medical Management of Chronic Issues    Discussed the use of AI scribe software for clinical note transcription with the patient, who gave verbal consent to proceed.  History of Present Illness Gabriel Dulworth. is a 53 year old male with hx of DMII, CHF, Afib, ASCVD, Pulm HTN, OSA, COPD, MASH, HLD, CKD stage 3a, polycythemia, and, psoriatic arthritis. He sees multiple specialist including: cardiology, rheumatology, GI, and oncology,  who presents DMII mgmt and concerns for uncontrolled blood sugar levels.  He has been experiencing elevated blood sugar levels, with readings reaching up to 350 mg/dL. His morning fasting blood sugar was 191 mg/dL. He has not made any changes to his diet or medication regimen. He is currently taking Ozempic  at 2 mg, Lantus  at 36 units, and aspart 10 units with additional correction doses of 2-3 units. Despite these measures, his blood sugar remains elevated.  His blood sugar levels rise significantly after meals and even during the night, with a recent reading of 352 mg/dL occurring in the middle of the night despite not eating after 4:30 PM. No recent changes in diet, medication, or stress levels that could account for these fluctuations. No recent episodes of hypoglycemia.  He is also taking Zyrtec  and requires a refill.   His liver medication, Rezdiffra , is prescribed by another doctor, Dr. Unk, and he needs to coordinate with her for refills.  He is currently not working and has been out of work since January.      01/01/2024    8:42 AM 11/20/2023   10:46 AM 04/16/2023   11:04 AM  Depression screen PHQ 2/9  Decreased Interest 0 0 3   Down, Depressed, Hopeless 0 0 3  PHQ - 2 Score 0 0 6  Altered sleeping 2 0 1  Tired, decreased energy 3 0 3  Change in appetite 1 3 2   Feeling bad or failure about yourself  0 0 3  Trouble concentrating 0 0 2  Moving slowly or fidgety/restless 0 0 2  Suicidal thoughts 0 0 1  PHQ-9 Score 6 3 20   Difficult doing work/chores Not difficult at all Not difficult at all Somewhat difficult       01/01/2024    8:43 AM 11/20/2023   10:46 AM 04/16/2023   11:06 AM 06/07/2022    8:45 AM  GAD 7 : Generalized Anxiety Score  Nervous, Anxious, on Edge 0 0 2 0  Control/stop worrying 0 0 2 0  Worry too much - different things 0 0 3 0  Trouble relaxing 1 0 3 0  Restless 0 0 1 0  Easily annoyed or irritable 0 0 3 0  Afraid - awful might happen 0 0 3 0  Total GAD 7 Score 1 0 17 0  Anxiety Difficulty Not difficult at all Not difficult at all Somewhat difficult Not difficult at all     ROS  Negative unless indicated in HPI   Objective:     BP 109/75 (BP Location: Left Arm, Patient Position: Sitting, Cuff Size: Normal)   Pulse 85   Temp 98.4 F (36.9 C) (Oral)   Ht 5' 11 (1.803 m)   Wt 243 lb  6.4 oz (110.4 kg)   SpO2 96%   BMI 33.95 kg/m    Physical Exam Constitutional:      General: He is not in acute distress.    Appearance: Normal appearance. He is obese. He is not ill-appearing, toxic-appearing or diaphoretic.  HENT:     Head: Normocephalic.     Nose: Nose normal.     Mouth/Throat:     Mouth: Mucous membranes are moist.  Eyes:     Extraocular Movements: Extraocular movements intact.     Conjunctiva/sclera: Conjunctivae normal.     Pupils: Pupils are equal, round, and reactive to light.  Neck:     Vascular: No carotid bruit.  Cardiovascular:     Rate and Rhythm: Normal rate and regular rhythm.     Heart sounds: No murmur heard.    No friction rub. No gallop.  Pulmonary:     Effort: Pulmonary effort is normal. No respiratory distress.     Breath sounds: Normal breath  sounds. No stridor. No wheezing, rhonchi or rales.  Chest:     Chest wall: No tenderness.  Abdominal:     Tenderness: There is no right CVA tenderness or left CVA tenderness.  Musculoskeletal:        General: No swelling, tenderness, deformity or signs of injury.     Right lower leg: No edema.     Left lower leg: No edema.  Lymphadenopathy:     Cervical: No cervical adenopathy.  Skin:    General: Skin is warm and dry.     Capillary Refill: Capillary refill takes less than 2 seconds.  Neurological:     General: No focal deficit present.     Mental Status: He is alert and oriented to person, place, and time. Mental status is at baseline.     Cranial Nerves: No cranial nerve deficit.     Sensory: No sensory deficit.     Motor: No weakness.     Coordination: Coordination normal.     Gait: Gait normal.  Psychiatric:        Mood and Affect: Mood normal.        Behavior: Behavior normal.        Thought Content: Thought content normal.        Judgment: Judgment normal.      No results found for any visits on 03/03/24.    The ASCVD Risk score (Arnett DK, et al., 2019) failed to calculate for the following reasons:   Risk score cannot be calculated because patient has a medical history suggesting prior/existing ASCVD    Assessment & Plan:  Seasonal allergies -     Cetirizine  HCl; Take 1 tablet (10 mg total) by mouth daily as needed for allergies.  Type 2 diabetes mellitus with hyperlipidemia (HCC) -     NovoLOG  FlexPen ReliOn; Inject 12 Units into the skin 3 (three) times daily with meals. INJECT 12 UNITS + CORRECTION BEFORE BREAKFAST/LUNCH AND SUPPER (CORRECTION = 1 UNIT PER 50 MG/DL ABOVE 849); MAX DAILY DOSE 51 UNITS  Dispense: 15 mL; Refill: 3     Assessment and Plan Assessment & Plan Type 2 Diabetes Mellitus Elevated blood glucose levels despite current regimen. Insulin  adjustment needed to improve control. No hypoglycemia history. - Increase Novolog  (insulin  aspart)   to 12 units with correction doses of 1 unit per 50 mg/dL above 849 mg/dL. - Continue faxiga 10mg  - Continue ozempic  2mg  weekly - Continue insulin  glargine 36 units nightly - Monitor blood glucose levels  and report if issues persist in two weeks. - Schedule follow-up for A1c check on or after April 02, 2024. - Consider adjusting Lantus  if blood glucose levels remain uncontrolled after two weeks. - Goa la1c <7% - Will work with pharmacist, Delles, on mgmt - fasting glucose goal 80-130 -Continue to make conscious decisions for well balanced diet smaller portions with increase protein, fruits, veggies, water as drink of choice, decrease starches, processed foods, and saturated fats. Increase weekly exercise - 150 minutes per week.  - Plan for foot exam next visit  HLD - Chronic, assc with CAD and CHF - Continue crestor  40mg  daily, tricor  145mg , and and zetia  10mg   Psoriatic arthritis  - Chronic, controlled - mgmt by rheum - continue taltz and allopurinol   Afib Controlled during visit today - mgmt by cards - continue amiodarone  200mg  - coreg  6.25 bid - xarelto  20mg  nightly  CHF - mgmt by cards - Currently stable - continue coreg  6.25 BID - continue Farxiga  10mg  daily - continue entresto  24-26 BID - continue sprinolactone 25 mg daily - continue torsemide  40mg  daily - nitroglycerin  0.4mg  prn  MASH - mgmt by GI - continue resmetirom  100mg  tablet daily  Hypertension Blood pressure well-controlled.  Continue  Allergic Rhinitis/seasonal allergies -Requires Zyrtec  refill for symptom management.  General Health Maintenance Advised to use sunscreen for skin protection, especially around healing site. - Advise use of sunscreen to protect skin from sun exposure.   Return in about 30 days (around 04/02/2024) for a1c check.   I, Curtis DELENA Boom, FNP, have reviewed all documentation for this visit. The documentation on 03/03/24 for the exam, diagnosis, procedures, and orders  are all accurate and complete.   Curtis DELENA Boom, FNP

## 2024-03-09 DIAGNOSIS — I10 Essential (primary) hypertension: Secondary | ICD-10-CM | POA: Diagnosis not present

## 2024-03-09 DIAGNOSIS — G4733 Obstructive sleep apnea (adult) (pediatric): Secondary | ICD-10-CM | POA: Diagnosis not present

## 2024-03-12 ENCOUNTER — Other Ambulatory Visit: Payer: Self-pay

## 2024-03-12 ENCOUNTER — Other Ambulatory Visit: Payer: Self-pay | Admitting: Family Medicine

## 2024-03-12 VITALS — BP 115/80

## 2024-03-12 DIAGNOSIS — E1169 Type 2 diabetes mellitus with other specified complication: Secondary | ICD-10-CM

## 2024-03-12 DIAGNOSIS — R739 Hyperglycemia, unspecified: Secondary | ICD-10-CM

## 2024-03-12 NOTE — Patient Instructions (Signed)
 Visit Information  Thank you for taking time to visit with me today. Please don't hesitate to contact me if I can be of assistance to you before our next scheduled appointment.  Our next appointment is by telephone on 03/24/2024 at 3:30 pm Please call the care guide team at [phone] if you need to cancel or reschedule your appointment.   I have referred you to our Pharmacist, Manuelita Kobs for management of your Diabetes/uncontrolled BG.  You will be contacted shortly to schedule with her.    Following is a copy of your care plan:   Goals Addressed             This Visit's Progress    [facility] RN Care Plan - DM       Problems:  Chronic Disease Management support and education needs related to DMII  Goal: Over the next 90 days the Patient will attend all scheduled medical appointments: PCP and specialist as evidenced by no missed appointments        continue to work with RN Care Manager and/or Social Worker to address care management and care coordination needs related to DMII as evidenced by adherence to care management team scheduled appointments     take all medications exactly as prescribed and will call provider for medication related questions as evidenced by patient reporting compliance with all medications    verbalize understanding of plan for management of DMII as evidenced by taking medications as prescribed, reporting lack of improvement in BG after medication changes as requested by provider, continue diabetic diet/exercise, follow DM action plan sent regarding signs and symptoms to report to provider and [place] for ED Engage with Pharmacist for Disease State Management - DM  Interventions:   Diabetes Interventions: Assessed patient's understanding of A1c goal: <7% Currently 6.9 and trending up with uncontrolled BG. Reviewed medications with patient and discussed importance of medication adherence Provided patient with written educational materials related to hypo and  hyperglycemia and importance of correct treatment Referral to Pharmacist for Disease State Management - DM Screening for signs and symptoms of depression related to chronic disease state  Assessed social determinant of health barriers Lab Results  Component Value Date   HGBA1C 6.9 (H) 01/01/2024    Patient Self-Care Activities:  Attend all scheduled provider appointments Call pharmacy for medication refills 3-7 days in advance of running out of medications Call provider office for new concerns or questions  Take medications as prescribed   schedule appointment with eye doctor check blood sugar at prescribed times: before meals and at bedtime and as directed by provider check feet daily for cuts, sores or redness enter blood sugar readings and medication or insulin  into daily log take the blood sugar log to all doctor visits keep feet up while sitting wash and dry feet carefully every day wear comfortable, cotton socks wear comfortable, well-fitting shoes Discuss endocrinology referral with PCP Complete visit with Pharmacist re DM  Plan:  Telephone follow up appointment with care management team member scheduled for:  03/24/24 at 3:30 pm             Please call the Suicide and Crisis Lifeline: 988 call the [place]  National Suicide Prevention Lifeline: (586)455-0328 or TTY: (562)725-8307 TTY 813-655-6842) to talk to a trained counselor call 1-800-273-TALK (toll free, 24 hour hotline) go to [facility] [address], [place] [phone]) call 911 if you are experiencing a Mental Health or Behavioral Health Crisis or need someone to talk  to.  Patient verbalizes understanding of instructions and care plan provided today and agrees to view in MyChart. Active MyChart status and patient understanding of how to access instructions and care plan via MyChart confirmed with patient.     Nestora Duos, MSN, RN [place]  [facility], [facility] RN Care Manager Direct Dial : [phone] Fax: [phone]  Diabetes Action Plan A diabetes action plan is a way for you to manage your symptoms of diabetes, also called diabetes mellitus. The plan is color-coded to guide you on what actions to take based on any symptoms you're having. If you have symptoms in the red zone, you need medical care right away. If you have symptoms in the yellow zone, your diabetes isn't under control, and you may need to make some changes. If you have symptoms in the green zone, you're doing well. Understanding diabetes can take time. Follow the treatment plan that you created with your health care provider. Know the target range for your blood sugar, also called glucose. Review your plan each time you visit your provider. The target range for my blood sugar level is __________________________ mg/dL. Red zone Get medical help right away if you have any of the following symptoms: A blood sugar test result that's below 54 mg/dL (3 mmol/L). A blood sugar test result that's at or above 240 mg/dL (86.6 mmol/L) for 2 days in a row along with: Extreme thirst and frequent peeing. Confusion or trouble thinking clearly. Moderate or large ketone levels in your pee (urine). Feeling tired or having no energy. Trouble breathing. Sickness or a fever for 2 or more days that's not getting better. These symptoms may be an emergency. Call 911 right away. Do not wait to see if the symptoms will go away. Do not drive yourself to the hospital. If you have very low blood sugar, also called severe hypoglycemia, and you can't eat or drink, you may need glucagon. Make sure a family member or close friend knows how to check your blood sugar and how to give you glucagon. You may need to be treated in a hospital for this condition. Yellow zone If you have any of the following symptoms, your diabetes isn't under control, and you may need to make some  changes: A blood sugar test result that's at or above 240 mg/dL (86.6 mmol/L) for 2 days in a row. Blood sugar test results that are below 70 mg/dL (3.9 mmol/L). Other symptoms of hypoglycemia, such as: Shaking or feeling light-headed. Confusion or irritability. Feeling hungry. Having a fast heartbeat. If you have any yellow zone symptoms: Treat your hypoglycemia by eating or drinking 15 grams of a rapid-acting carbohydrate. Follow the 15:15 rule: Take 15 grams of a rapid-acting carbohydrate, such as: 1 tube of glucose gel. 4 glucose pills. 4 oz (120 mL) of fruit juice. 4 oz (120 mL) of regular (not diet) soda. Check your blood sugar again 15 minutes after you take the carbohydrate. If the second blood sugar test is still at or below 70 mg/dL (3.9 mmol/L), take 15 grams of a carbohydrate again. If your blood sugar doesn't increase above 70 mg/dL (3.9 mmol/L) after 3 tries, get medical help right away. After your blood sugar returns to normal, eat a meal or a snack within 1 hour. Keep taking your daily medicines as told by your provider. Check your blood sugar more often than you normally would. Write down your results. Call your provider if you have trouble keeping your blood sugar  in your target range. Green zone These signs mean you're doing well and can continue what you're doing to manage your diabetes: Your blood sugar is within your personal target range. For most people, a blood sugar level before a meal should be 80-130 mg/dL (4.4-7.2 mmol/L). You feel well, and you're able to do daily activities. If you're in the green zone, continue to manage your diabetes as told by your provider. To do this: Eat a healthy diet. Exercise regularly. Check your blood sugar as told. Take your medicines only as told. Where to find more information American Diabetes Association (ADA): diabetes.org Association of Diabetes Care & Education Specialists (ADCES):  adces.org/diabetes-education-dsmes This information is not intended to replace advice given to you by your health care provider. Make sure you discuss any questions you have with your health care provider. Document Revised: 03/26/2023 Document Reviewed: 03/26/2023 Elsevier Patient Education  2024 [employer]  Diarrhea, Adult Diarrhea is when you pass loose and sometimes watery poop (stool) often. Diarrhea can make you feel weak and cause you to lose water in your body (get dehydrated). Losing water in your body can cause you to: Feel tired and thirsty. Have a dry mouth. Go pee (urinate) less often. Diarrhea often lasts 2-3 days. It can last longer if it is a sign of something more serious. Be sure to treat your diarrhea as told by your doctor. Follow these instructions at home: Eating and drinking     Follow these instructions as told by your doctor: Take an ORS (oral rehydration solution). This is a drink that helps you replace fluids and minerals your body lost. It is sold at pharmacies and stores. Drink enough fluid to keep your pee (urine) pale yellow. Drink fluids such as: Water. You can also get fluids by sucking on ice chips. Diluted fruit juice. Low-calorie sports drinks. Milk. Avoid drinking fluids that have a lot of sugar or caffeine  in them. These include soda, energy drinks, and regular sports drinks. Avoid alcohol. Eat bland, easy-to-digest foods in small amounts as you are able. These foods include: Bananas. Applesauce. Rice. Low-fat (lean) meats. Toast. Crackers. Avoid spicy or fatty foods.  Medicines Take over-the-counter and prescription medicines only as told by your doctor. If you were prescribed antibiotics, take them as told by your doctor. Do not stop taking them even if you start to feel better. General instructions  Wash your hands often using soap and water for 20 seconds. If soap and water are not available, use hand sanitizer. Others in your home  should wash their hands as well. Wash your hands: After using the toilet or changing a diaper. Before preparing, cooking, or serving food. While caring for a sick person. While visiting someone in a hospital. Rest at home while you get better. Take a warm bath to help with any burning or pain from having diarrhea. Watch your condition for any changes. Contact a doctor if: You have a fever. Your diarrhea gets worse. You have new symptoms. You vomit every time you eat or drink. You feel light-headed, dizzy, or you have a headache. You have muscle cramps. You have signs of losing too much water in your body, such as: Dark pee, very little pee, or no pee. Cracked lips. Dry mouth. Sunken eyes. Sleepiness. Weakness. You have bloody or black poop or poop that looks like tar. You have very bad pain, cramping, or bloating in your belly (abdomen). Your skin feels cold and clammy. You feel confused. Get help right away  if: You have chest pain. Your heart is beating very quickly. You have trouble breathing or you are breathing very quickly. You feel very weak or you faint. These symptoms may be an emergency. Get help right away. Call 911. Do not wait to see if the symptoms will go away. Do not drive yourself to the hospital. This information is not intended to replace advice given to you by your health care provider. Make sure you discuss any questions you have with your health care provider. Document Revised: 01/22/2022 Document Reviewed: 01/22/2022 Elsevier Patient Education  2024 [employer].

## 2024-03-12 NOTE — Patient Outreach (Signed)
 Complex Care Management   Visit Note  03/12/2024  Name:  Gabriel [name]. MRN: [id number] DOB: 11-Dec-1970  Situation: Referral received for Complex Care Management related to DM I obtained verbal consent from Patient.  Visit completed with Patient  on the phone  Background:   Past Medical History:  Diagnosis Date   Anginal pain (HCC)    Aortic atherosclerosis (HCC)    Aortic root dilation (HCC) 11/28/2016   a.) TTE 11/28/2016: 43 mm; b.) TTE 11/23/2019: 38 mm; c.) TTE 08/14/2022: 40 mm; d.) TTE 03/08/2023: 43 mm; e.) TTE 01/02/2024: 40 mm   Arteriosclerosis of right carotid artery    a.) carotid doppler 04/29/2017: <50% RICA   Atrial fibrillation with RVR (HCC)    a.) CHA2DS2VASc = 6 (HFrEF, HTN, CVA x 2, vascular disease, T2DM) as of 01/27/2024; b.) s/p DCCV 11/06/2023 --> 200 J x 1 -->  NSR; c.) rate/rhythm maintained on oral amiodarone  + carvedilol ; chronically anticoagulated with rivaroxaban    CAD (coronary artery disease)    a.) MV 04/2015: low risk; b.) LHC 01/06/2017: minor irregs in LAD/Diag/LCX/OM, RCA 40p/m/d; c.) R/LHC 06/13/2020: 50% dLAD, 30% p-mLCx, 40% OM1, 100% pRCA (L-R collats) - med mgmt; d.) [facility] 08/19/2023: 50% dLAD, 30% p-mLCx, 40% OM1, 100% pRCA (L-R collats), 50% p-mRCA - med mgmt   Cardiomegaly    Cerebellar infarct (HCC)    a.) brain MRI 06/23/2023: tiny chronic infarct within the RIGHT cerebellar hemisphere; unsure of timing of neurological event; no deficits   Chest wall pain, chronic    Chronic 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    Chronic Troponin Elevation    CKD (chronic kidney disease), stage II-III    COPD (chronic obstructive pulmonary disease) (HCC)    CPAP (continuous positive airway pressure) dependence 03/08/2023   Depression    DM (diabetes mellitus), type 2 (HCC)    Dyspnea    GERD (gastroesophageal reflux disease)    Headache    HFrEF (heart failure with reduced ejection fraction) (HCC)    History of 2019 novel coronavirus disease  (COVID-19) 03/01/2022   History of kidney stones    History of methicillin resistant staphylococcus aureus (MRSA)    Hyperlipidemia    Hypertension    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 12/2015 noted on tele-->amio;  b. 12/2015 Event monitor: no VT noted.   Obesity (BMI 30.0-34.9)    On rivaroxaban  therapy    OSA treated with BiPAP    Pneumonia    Polyp of descending colon    Psoriasis    Psoriatic arthritis (HCC)    a.) Tx'd with ixekizumab   Pulmonary HTN (HCC) 02/29/2016   a.) TTE 02/29/2016: RVSP 47; b.) TTE  06/26/2019: RVSP 58.4; c.) TTE 12/08/2019: RVSP 40.8; d.) TTE 06/08/2020: RVSP 44.7; e.) R/LHC 06/08/2020: mRA 9, mPA 40, mPWCP 17, AO sat 98, PA sat 61, CO 3.1, CI 1.3, PVR 7.4, LVEDP 20; f.) R/LHC 08/18/2020: mRA 5, mPA 41, mPCWP 18, AO sat 92, PA sat 59, CO 4.3, CI 1.9, PVR 5.3   Recurrent pulmonary emboli (HCC) 06/07/2020   06/07/20: small bilateral PEs.  12/31/19: RUL and RLL PEs.   Secondary polycythemia    a.) negative for JAK2 with reflex and BCR-ABL mutations; seeing hematology with etiology felt to be secondary to underlying COPD (quit smoking 04/2020), heart disease, OSAH, and  obesity   Stroke [facility])    Syncope    a. 01/2016 - felt to be vasovagal.   Thrombocytopenia (HCC)     Assessment: Patient Reported Symptoms:  Cognitive Cognitive Status: Alert and oriented to person, place, and time, Normal speech and language skills, Insightful and able to interpret abstract concepts   Health Maintenance Behaviors: Annual physical exam, Healthy diet, Exercise Healing Pattern: Average Health Facilitated by: Healthy diet  Neurological Neurological Review of Symptoms: Dizziness Neurological Management Strategies: Activity, Adequate rest Neurological Comment: sometimes dizziness - goes away after 5-10 seconds, initially treated for vertigo - ENT said not vertigo, not as frequent as before, thinks related to elevated BG, checked BP - ok  HEENT HEENT Symptoms Reported: Swelling around throat HEENT Comment: reports difficulty eating and at times drinking for about 1 month, throat feels like closing and hard to swallow, no choking, mouth dry, will make appointment with ENT as discussed    Cardiovascular Cardiovascular Symptoms Reported: No symptoms reported Does patient have uncontrolled Hypertension?: No Cardiovascular Management Strategies: Medication therapy, Routine screening Cardiovascular Comment: denies HF symptoms, dull pain in chest for years since MI, aware of red flags for ED related to CHF and MI  Respiratory Respiratory Symptoms Reported: Shortness of breath Additional Respiratory Details: allergies - gets runny nose, SOB when walking at times, relieved with rest, not currently needing rescue inhaler or neb, reports using BiPAP regularly Respiratory Management Strategies: Adequate rest, Medication therapy, Routine screening, BiPAP  Endocrine Endocrine Symptoms Reported: Other Other symptoms related to hypoglycemia or hyperglycemia: dizziness r/t elevated BG, no lows, noted elevated BG for months, previously controlled until Jan/Feb, no improvement with increased  insulin , advised to contact PCP Endocrine Comment: FBG today 231 - ranging 200-upper 300s with 400's at times, no changes in food/drink exercise, no signs of infection - will notify PCP no improvement with increased insulin   Gastrointestinal Gastrointestinal Symptoms Reported: Diarrhea Additional Gastrointestinal Details: reports sudden diarrhea after eating at times, discussed tracking food and symptoms, reduced dairy prior, thinks related to metfromin and zantac  - had taken for long time - will discuss with PCP Gastrointestinal Management Strategies: Medication therapy    Genitourinary Genitourinary Symptoms Reported: Frequency Additional Genitourinary Details: foamy urine - discussed possible causes, patient states dehydration requiring ED several times without known illness - trying to balance hydrating for kidneys and restricting for HF - will discuss fluid restriction with cardiology Genitourinary Management Strategies: Medication therapy, Fluid modification, Diet modification, Activity  Integumentary Integumentary Symptoms Reported: No symptoms reported Additional Integumentary Details: scalp site closed, healing still sore Skin Management Strategies: Routine screening  Musculoskeletal Musculoskelatal Symptoms Reviewed: Back pain, Joint pain Additional Musculoskeletal Details: back and knees Musculoskeletal Management Strategies: Activity, Exercise, Routine screening Falls in the past year?: No Number of falls in past year: 1 or less Was there an injury with Fall?: No Fall  Risk Category Calculator: 0 Patient Fall Risk Level: Low Fall Risk Patient at Risk for Falls Due to: History of fall(s) Fall risk Follow up: Falls evaluation completed, Falls prevention discussed  Psychosocial Psychosocial Symptoms Reported: Irritability Additional Psychological Details: realtes to inability to figure out what is going on with health - states does not think people aways honest, some people in life  causing stress for the most part I am happy and jolly Behavioral Management Strategies: Support system, Activity, Exercise Major Change/Loss/Stressor/Fears (CP): Medical condition, self Techniques to Cope with Loss/Stress/Change: Diversional activities, Exercise Quality of Family Relationships: supportive Do you feel physically threatened by others?: No      03/12/2024    9:55 AM  Depression screen PHQ 2/9  Decreased Interest 0  Down, Depressed, Hopeless 0  PHQ - 2 Score 0    Vitals:   03/12/24 0940  BP: 115/80    Medications Reviewed Today     Reviewed by Devra Lands, RN (Registered Nurse) on 03/12/24 at [phone]  Med List Status: <None>   Medication Order Taking? Sig Documenting Provider Last Dose Status Informant  Accu-Chek Softclix Lancets lancets [id number] Yes USE UP TO FOUR TIMES DAILY [provider]  Active Self, Pharmacy Records  albuterol  (VENTOLIN  HFA) 108 (90 Base) MCG/ACT inhaler [id number]  Inhale 2 puffs into the lungs every 4 (four) hours as needed for wheezing or shortness of breath.  Patient not taking: Reported on 03/12/2024   Isadora Hose, MD  Active Self, Pharmacy Records           Med Note [facility] [place] A   Fri Feb 06, 2024  2:26 PM) PRN  allopurinol  (ZYLOPRIM ) 300 MG tablet [id number] Yes Take 300 mg by mouth every morning. [provider]  Active Self, Pharmacy Records  amiodarone  (PACERONE ) 200 MG tablet [id number] Yes Take 1 tablet (200 mg total) by mouth daily. Cindie Ole DASEN, MD  Active Self, Pharmacy Records  aspirin  EC 81 MG tablet [id number] Yes Take 81 mg by mouth every morning. [provider]  Active Self, Pharmacy Records  BD PEN NEEDLE NANO 2ND GEN 32G X 4 MM MISC [id number] Yes USE  AS DIRECTED AT BEDTIME Emilio Kelly DASEN, FNP  Active Self, Pharmacy Records  blood glucose meter kit and supplies KIT [id number] Yes Dispense based on patient and insurance preference. Use up to four times daily as directed. Emilio Kelly DASEN, FNP  Active Self, Pharmacy Records  butalbital -acetaminophen -caffeine  (FIORICET ) 50-325-40 MG tablet [id number]  Take 1 tablet by mouth every 4 (four) hours as needed for migraine.  Patient not taking: Reported on 03/12/2024   [provider]  Active Self, Pharmacy Records  carvedilol  (COREG ) 6.25 MG tablet [id number] Yes Take 1 tablet (6.25 mg total) by mouth 2 (two) times daily. Donette Ellouise LABOR, FNP  Active Self, Pharmacy Records  cetirizine  (ZYRTEC ) 10 MG tablet [id number] Yes Take 1 tablet (10 mg total) by mouth daily as needed for allergies. [name] LABOR, FNP  Active   Continuous Glucose Sensor (FREESTYLE LIBRE 3 PLUS SENSOR) [place] [id number] Yes Place 1 sensor on the skin every 15 days. Use to check glucose continuously Clifton, Kellie A, FNP  Active   dapagliflozin  propanediol (FARXIGA ) 10 MG TABS tablet [id number] Yes Take 1 tablet (10 mg total) by mouth daily. Donette Ellouise LABOR, FNP  Active Self, Pharmacy Records  ezetimibe  (ZETIA ) 10 MG tablet [id number] Yes Take 1 tablet (10 mg total) by mouth daily. Gollan, Timothy J, MD  Active   fenofibrate  (  TRICOR ) 145 MG tablet [id number] Yes Take 1 tablet (145 mg total) by mouth every evening. Gollan, Timothy J, MD  Active   fluticasone  (FLONASE ) 50 MCG/ACT nasal spray [id number] Yes Place 2 sprays into both nostrils daily as needed for allergies or rhinitis. Home med. Poggi, Norleen PARAS, MD  Active Self, Pharmacy Records           Med Note [facility] [place] A   Fri Feb 06, 2024  2:26 PM) prn  insulin  aspart (NOVOLOG  FLEXPEN) 100 UNIT/ML FlexPen [id number] Yes Inject 12 Units into the skin 3 (three) times daily with meals. INJECT 12 UNITS + CORRECTION BEFORE BREAKFAST/LUNCH AND SUPPER (CORRECTION = 1 UNIT PER 50 MG/DL ABOVE 849); MAX DAILY DOSE 51 UNITS Wellington Beams A, FNP  Active   Insulin  Glargine (BASAGLAR  KWIKPEN) 100 UNIT/ML [id number] Yes Inject 36 Units into the skin at bedtime. Emilio Kelly DASEN, FNP  Active Self, Pharmacy Records   Insulin  Pen Needle 32G X 4 MM MISC [id number] Yes 1 each by Does not apply route in the morning, at noon, in the evening, and at bedtime. Emilio Kelly DASEN, FNP  Active Self, Pharmacy Records  ipratropium-albuterol  (DUONEB) 0.5-2.5 (3) MG/3ML SOLN [id number] Yes Take 3 mLs by nebulization every 6 (six) hours as needed. Wellington Beams LABOR, FNP  Active Self, Pharmacy Records  meclizine  (ANTIVERT ) 25 MG tablet [id number]  Take 0.5 tablets (12.5 mg total) by mouth 3 (three) times daily as needed for dizziness.  Patient not taking: Reported on 03/12/2024   Poggi, John J, MD  Consider Medication Status and Discontinue Self, Pharmacy Records           Med Note [facility] [place] A   Fri Feb 06, 2024  2:27 PM) prn  nitroGLYCERIN  (NITROSTAT ) 0.4 MG SL tablet [id number] Yes Place 1 tablet (0.4 mg total) under the tongue every 5 (five) minutes x 3 doses as needed for chest pain. Gollan, Timothy J, MD  Active   potassium chloride  SA (KLOR-CON  M) 20 MEQ tablet [id number] Yes Take 1 tablet (20 mEq total) by mouth every other day. AM Gollan, Timothy J, MD  Active   Resmetirom  (REZDIFFRA ) 100 MG TABS [id number]  TAKE 1 TABLET BY MOUTH DAILY WITH OR WITHOUT FOOD  Patient not taking: Reported on 03/12/2024   Unk Corinn Skiff, MD  Active Self, Pharmacy Records  rivaroxaban  (XARELTO ) 20 MG TABS tablet [id number] Yes Take 1 tablet (20 mg total) by mouth daily with supper. Bensimhon, Toribio SAUNDERS, MD  Active Self, Pharmacy Records  rosuvastatin  (CRESTOR ) 40 MG tablet [id number] Yes Take 1 tablet (40 mg total) by mouth at bedtime. Gollan, Timothy J, MD  Active   sacubitril -valsartan  (ENTRESTO ) 24-26 MG [id number] Yes Take 1 tablet by mouth 2 (two) times daily. [place]ette [place] A, FNP  Active   Semaglutide , 2 MG/DOSE, (OZEMPIC , 2 MG/DOSE,) 8 MG/3ML SOPN [id number] Yes Inject 2 mg into the skin once a week. Tuesdays Ostwalt, Janna, PA-C  Active Self, Pharmacy Records  spironolactone  (ALDACTONE ) 25 MG tablet [id number] Yes Take 1 tablet by  mouth once daily Donette [place] LABOR, FNP  Active   TALTZ 80 MG/ML SOAJ [id number] Yes Inject 3 mLs into the skin every 28 (twenty-eight) days. [provider]  Active Self, Pharmacy Records           Med Note CHRISTIE ALEXANDER   Fri Aug 08, 2023  3:20 PM)    Torsemide  40 MG TABS [id number] Yes Take 40 mg by mouth daily. Gollan, Timothy J, MD  Active  Recommendation:   PCP Follow-up Referral to: Pharmacist for DM Continue Current Plan of Care  Follow Up Plan:   Telephone follow-up 2 Lisett Dirusso  Nestora Duos, MSN, RN [facility]  [facility], [facility] RN Care Manager Direct Dial : [phone] Fax: [phone]

## 2024-03-15 ENCOUNTER — Encounter: Payer: Self-pay | Admitting: Pharmacist

## 2024-03-15 ENCOUNTER — Telehealth: Payer: Self-pay | Admitting: Pharmacist

## 2024-03-15 ENCOUNTER — Other Ambulatory Visit (HOSPITAL_COMMUNITY): Payer: Self-pay

## 2024-03-15 ENCOUNTER — Telehealth: Payer: Self-pay

## 2024-03-15 ENCOUNTER — Other Ambulatory Visit: Payer: Self-pay | Admitting: Family Medicine

## 2024-03-15 ENCOUNTER — Other Ambulatory Visit: Payer: Self-pay | Admitting: Pharmacist

## 2024-03-15 DIAGNOSIS — E0822 Diabetes mellitus due to underlying condition with diabetic chronic kidney disease: Secondary | ICD-10-CM

## 2024-03-15 DIAGNOSIS — K7581 Nonalcoholic steatohepatitis (NASH): Secondary | ICD-10-CM

## 2024-03-15 DIAGNOSIS — K219 Gastro-esophageal reflux disease without esophagitis: Secondary | ICD-10-CM

## 2024-03-15 MED ORDER — OMEPRAZOLE 40 MG PO CPDR: 40.0000 mg | DELAYED_RELEASE_CAPSULE | Freq: Every day | ORAL | 3 refills | Status: DC

## 2024-03-15 NOTE — Progress Notes (Unsigned)
 03/15/2024 Name: Gabriel [name]. MRN: [id number] DOB: 09-02-1970  Chief Complaint  Patient presents with   Medication Management    Gabriel [name]. is a 53 y.o. year old male who presented for a telephone visit.   They were referred to the pharmacist by their PCP for assistance in managing diabetes.      Subjective:   Care Team: Primary Care Provider: [name] LABOR, FNP; Next Scheduled Visit: 04/02/2024 Rheumatologist: Defoor, Elsie Conch, PA Pulmonologist: Isadora Hose, MD Heart Failure Specialist: Donette Ellouise LABOR, FNP Gastroenterologist: Unk Corinn Skiff, MD Cardiologist: Perla Evalene PARAS, MD Electrophysiologist: Cindie Ole DASEN, MD; Next Scheduled Visit: 03/17/2024 Orthopedic Specialist: [name] Norleen Purchase, MD Nurse Care Manager: Devra Lands, RN; Initial Scheduled Visit: 03/24/2024 Dietitian: Katheleen Aleene ORN, RD; Initial Scheduled Visit: 04/06/2024  Medication Access/Adherence  Current Pharmacy:  [facility] [address] ([address]), [place] - [address] [place]) [place] [address] Phone: [phone] Fax: [phone]  [facility] - [place], [place] - [address] [place] [address] Phone: [phone] Fax: [phone]  [facility] [place]  - [place], [place] - [address] [place] [address] Phone: [phone] Fax: [phone]  [facility] [address] [place] [address] Phone: [phone] Fax: [phone]   Patient reports affordability concerns with their medications: No  Patient reports access/transportation concerns to their pharmacy: No  Patient reports adherence concerns with their medications:  No     From review of chart, looks like patient last seen by Dr. Unk on 12/31/2023. Patient advised to:  - continue  on omeprazole  40 mg once daily long-term given history of peptic ulcer disease and on Xarelto   - Take Reglan  5 mg before each meal as needed only  - Continue taking resmetirom  (Rezdiffra ) - per Results Follow-Up note on 01/05/2024   Today find that patient is out of omeprazole  (latest prescription out of refills) and out of Rezdiffra      Diabetes:   Current medications:  - Ozempic  2 mg weekly on Tuesdays - Basaglar  36 units daily - Novolog  12 units base + 1 unit correction for every 50 mg/dL above 849 prior to meals  Confirms increased Novolog  dose as recommended by PCP  - Farxiga  10 mg daily    Medications tried in the past: metformin  - GI intolerance;    Patient uses Freestyle Libre 3 Plus Continuous Glucose Monitor   Review patient's last 14-days of data from LibreView:   Date of Download: 03/15/24 % Time CGM is active: 96% Average Glucose: 240 mg/dL Glucose Management Indicator: 9.1%  Glucose Variability: 22.9% (goal <36%) Time in Goal:  - Time in range 70-180: 13% - Time above range: 87% - Time below range: 0%    Patient denies recent symptoms of hypoglycemia   Objective:  Lab Results  Component Value Date   HGBA1C 6.9 (H) 01/01/2024    Lab Results  Component Value Date   CREATININE 1.50 (H) 02/24/2024   BUN 21 02/24/2024   NA 141 02/24/2024   K 4.5 02/24/2024   CL 99 02/24/2024   CO2 19 (L) 02/24/2024    Lab Results  Component Value Date   CHOL 180 01/01/2024   HDL 29 (L) 01/01/2024   LDLCALC 94 01/01/2024   LDLDIRECT 60.6 11/23/2020   TRIG 341 (H) 01/01/2024   CHOLHDL 6.2 (H) 01/01/2024  Current Outpatient Medications on File Prior to Visit  Medication Sig Dispense Refill   Accu-Chek Softclix Lancets lancets USE UP TO FOUR TIMES DAILY     albuterol  (VENTOLIN  HFA) 108 (90 Base) MCG/ACT inhaler Inhale 2 puffs into the lungs every 4 (four) hours as needed for wheezing or shortness of breath. (Patient not taking: Reported on 03/12/2024) 8 g 6    allopurinol  (ZYLOPRIM ) 300 MG tablet Take 300 mg by mouth every morning.     amiodarone  (PACERONE ) 200 MG tablet Take 1 tablet (200 mg total) by mouth daily. 90 tablet 3   aspirin  EC 81 MG tablet Take 81 mg by mouth every morning.     BD PEN NEEDLE NANO 2ND GEN 32G X 4 MM MISC USE  AS DIRECTED AT BEDTIME 200 each 0   blood glucose meter kit and supplies KIT Dispense based on patient and insurance preference. Use up to four times daily as directed. 1 each 11   butalbital -acetaminophen -caffeine  (FIORICET ) 50-325-40 MG tablet Take 1 tablet by mouth every 4 (four) hours as needed for migraine. (Patient not taking: Reported on 03/12/2024)     carvedilol  (COREG ) 6.25 MG tablet Take 1 tablet (6.25 mg total) by mouth 2 (two) times daily. 60 tablet 11   cetirizine  (ZYRTEC ) 10 MG tablet Take 1 tablet (10 mg total) by mouth daily as needed for allergies.     Continuous Glucose Sensor (FREESTYLE LIBRE 3 PLUS SENSOR) MISC Place 1 sensor on the skin every 15 days. Use to check glucose continuously 2 each 12   dapagliflozin  propanediol (FARXIGA ) 10 MG TABS tablet Take 1 tablet (10 mg total) by mouth daily. 90 tablet 3   ezetimibe  (ZETIA ) 10 MG tablet Take 1 tablet (10 mg total) by mouth daily. 90 tablet 3   fenofibrate  (TRICOR ) 145 MG tablet Take 1 tablet (145 mg total) by mouth every evening. 90 tablet 3   fluticasone  (FLONASE ) 50 MCG/ACT nasal spray Place 2 sprays into both nostrils daily as needed for allergies or rhinitis. Home med.     insulin  aspart (NOVOLOG  FLEXPEN) 100 UNIT/ML FlexPen Inject 12 Units into the skin 3 (three) times daily with meals. INJECT 12 UNITS + CORRECTION BEFORE BREAKFAST/LUNCH AND SUPPER (CORRECTION = 1 UNIT PER 50 MG/DL ABOVE 849); MAX DAILY DOSE 51 UNITS 15 mL 3   Insulin  Glargine (BASAGLAR  KWIKPEN) 100 UNIT/ML Inject 36 Units into the skin at bedtime. 45 mL 3   Insulin  Pen Needle 32G X 4 MM MISC 1 each by Does not apply route in the morning, at noon, in the evening, and at bedtime.  120 each 11   ipratropium-albuterol  (DUONEB) 0.5-2.5 (3) MG/3ML SOLN Take 3 mLs by nebulization every 6 (six) hours as needed. 360 mL 11   meclizine  (ANTIVERT ) 25 MG tablet Take 0.5 tablets (12.5 mg total) by mouth 3 (three) times daily as needed for dizziness. (Patient not taking: Reported on 03/12/2024)     nitroGLYCERIN  (NITROSTAT ) 0.4 MG SL tablet Place 1 tablet (0.4 mg total) under the tongue every 5 (five) minutes x 3 doses as needed for chest pain. 30 tablet 3   potassium chloride  SA (KLOR-CON  M) 20 MEQ tablet Take 1 tablet (20 mEq total) by mouth every other day. AM 45 tablet 3   Resmetirom  (REZDIFFRA ) 100 MG TABS TAKE 1 TABLET BY MOUTH DAILY WITH OR WITHOUT FOOD (Patient not taking: Reported on 03/12/2024) 30 tablet 1   rivaroxaban  (XARELTO ) 20 MG TABS tablet Take 1 tablet (20 mg total) by mouth  daily with supper. 90 tablet 3   rosuvastatin  (CRESTOR ) 40 MG tablet Take 1 tablet (40 mg total) by mouth at bedtime. 90 tablet 3   sacubitril -valsartan  (ENTRESTO ) 24-26 MG Take 1 tablet by mouth 2 (two) times daily. 180 tablet 1   Semaglutide , 2 MG/DOSE, (OZEMPIC , 2 MG/DOSE,) 8 MG/3ML SOPN Inject 2 mg into the skin once a week. Tuesdays 3 mL 2   spironolactone  (ALDACTONE ) 25 MG tablet Take 1 tablet by mouth once daily 90 tablet 1   TALTZ 80 MG/ML SOAJ Inject 3 mLs into the skin every 28 (twenty-eight) days.     Torsemide  40 MG TABS Take 40 mg by mouth daily. 90 tablet 3   No current facility-administered medications on file prior to visit.      Assessment/Plan:   Will collaborate with PCP to ask if provider willing to send renewal of omeprazole  prescription to pharmacy for patient  Patient to contact [facility] for refill of Rezdiffra   Patient plans to contact [facility] to schedule appointment with Dr. Unk  Diabetes: - Reviewed long term cardiovascular and renal outcomes of uncontrolled blood sugar - Have counseled patient on s/s of low blood sugar and how to  treat lows Encouraged patient to obtain glucose tablets to carry with him - Reviewed dietary modifications including importance of having regular well-balanced meals and snacks throughout the day, while controlling carbohydrate portion sizes - Counsel patient on importance of medication adherence. In particular, encourage patient to ensure adherence to using Novolog  consistently with meals - Send message to collaborate with PCP regarding diabetes medication management for patient. Recommend switch from Ozempic  to Mounjaro  - Recommend to continue to use Freestyle Libre 3 CGM to monitor blood sugar/as feedback on dietary choices Recommend to check glucose with fingerstick check when needed for symptoms and as back up to CGM.  Patient to contact office if needed for readings outside of established parameters or symptoms     Follow Up Plan: Clinical Pharmacist will follow up with patient by telephone again next month     Sharyle Sia, PharmD, [facility] [phone]

## 2024-03-15 NOTE — Telephone Encounter (Signed)
 Pharmacy Patient Advocate Encounter  Insurance verification completed.   The patient is insured through [employer]   Ran test claim for Ozempic . Currently a quantity of 6ml is a 84 day supply and the co-pay is $4.00 .  This test claim was processed through [facility]- copay amounts may vary at other pharmacies due to pharmacy/plan contracts, or as the patient moves through the different stages of their insurance plan.

## 2024-03-15 NOTE — Progress Notes (Unsigned)
 Dr. Gollan,  Patient taking Rezdiffra  as prescribed by GI Specialist. Rezdiffra  may increase serum concentration of rosuvastatin . Package labeling recommends limiting rosuvastatin  dose to 20 mg daily during coadministration with Rezdiffra .  Would you please consider reducing his rosuvastatin  dose to 20 mg daily?  Thank you!  Sharyle Sia, PharmD, [facility] [phone]

## 2024-03-16 MED ORDER — TIRZEPATIDE 5 MG/0.5ML ~~LOC~~ SOAJ: 5.0000 mg | SUBCUTANEOUS | 0 refills | Status: DC

## 2024-03-16 NOTE — Progress Notes (Unsigned)
 Electrophysiology Office Follow up Visit Note:    Date:  03/17/2024   ID:  Gabriel LOISE Darrel Mickey., DOB 25-Apr-1971, MRN [id number]  PCP:  [name] LABOR, FNP  [facility] Cardiologist:  [name], MD  [facility] Electrophysiologist:  Gabriel ONEIDA HOLTS, MD    Interval History:     Gabriel Facundo. is a 53 y.o. male who presents for a follow up visit.   I last saw the patient Dec 24, 2023.  He has a history of atrial fibrillation, chronic systolic heart failure with an ICD in situ, COPD, PE on Xarelto , obstructive sleep apnea on CPAP.  At the last appointment he was doing okay.  We planned for possible catheter ablation of his atrial fibrillation but wanted to make sure his pulmonary status was stable prior to proceeding.  An echo was ordered at the last appointment to reassess his LV function.  At the last appointment with Dr. Cherrie June 4 the patient was doing okay and maintaining sinus rhythm on amiodarone .  Today he reports frequent episodes of lightheadedness and dizziness.  Sometimes these feelings are associated with palpitations but not always.  No syncope.  No ICD shocks.  No fluid accumulation.  The symptoms have progressively worsened over the last couple of weeks.  He also reports his blood pressures have slowly decreased over the last couple weeks.  No recent illnesses.  He has been taking his medications without missed doses.       Past medical, surgical, social and family history were reviewed.  ROS:   Please see the history of present illness.    All other systems reviewed and are negative.  EKGs/Labs/Other Studies Reviewed:    The following studies were reviewed today:  Jan 02, 2024 echo EF 35-40 RV low normal Dilated left atrium No MR Mild to moderate AI   March 17, 2024 in clinic device interrogation personally reviewed Battery longevity 80% No delivered therapies 0% A-fib burden Presenting EGM appears to be sinus rhythm         Physical Exam:    VS:  BP 99/66   Pulse 83   Ht 5' 11 (1.803 m)   Wt 244 lb 6.4 oz (110.9 kg)   SpO2 94%   BMI 34.09 kg/m     Wt Readings from Last 3 Encounters:  03/17/24 244 lb 6.4 oz (110.9 kg)  03/03/24 243 lb 6.4 oz (110.4 kg)  02/24/24 242 lb 6 oz (109.9 kg)     GEN: no distress CARD: RRR, No MRG RESP: No IWOB. CTAB.      ASSESSMENT:    1. Chronic systolic heart failure (HCC)   2. Persistent atrial fibrillation (HCC)    PLAN:    In order of problems listed above:  #Lightheadedness/dizziness Unclear cause.  Could be rhythm related or related to hypotension.  I need to stop his Coreg  and spironolactone  and bring him back to clinic with an APP in a couple of weeks to recheck.  He is also getting keep a close check on his blood pressures at home.  Unguinal order a 2-week ZIO monitor to make sure that we are not missing an arrhythmia not detected by his defibrillator.  For now we mutually agreed that he is not stable enough to undergo an A-fib ablation.  If we can sort out what is driving these transient episodes of lightheadedness and instability, can revisit catheter ablation.  For now, would continue with amiodarone .  I am to see him  back myself in about 6 weeks to review the heart rhythm monitor results and revisit catheter ablation.  #Persistent atrial fibrillation #High risk med monitoring-amiodarone  Maintaining sinus rhythm.  Associated with chronic systolic heart failure and hospitalization.  Rhythm control indicated.  I discussed treatment options including continuing amiodarone  versus pursuing catheter ablation.  The patient is very clear that would like to proceed with catheter ablation which is reasonable.  Given the above lightheadedness episodes, we will postpone his ablation for now.  We we will revisit this when I see him back myself in about 6 to 8 weeks after his heart monitor.  Continue Xarelto  for stroke prophylaxis  #Chronic systolic heart  failure NYHA class II-III.  Follows with the heart failure clinic.  Continue GDMT. Stop Coreg  and spironolactone .  He will follow his blood pressures at home.  It is possible that some of the medication induced hypotension is driving his symptoms.  Follow-up with APP in 2 weeks Follow-up with me in 6 to 8 weeks  Signed, Gabriel Holts, MD, [facility], [facility] 03/17/2024 10:29 AM    Electrophysiology [facility]

## 2024-03-17 ENCOUNTER — Ambulatory Visit: Attending: Cardiology | Admitting: Cardiology

## 2024-03-17 ENCOUNTER — Ambulatory Visit

## 2024-03-17 ENCOUNTER — Other Ambulatory Visit: Payer: Self-pay

## 2024-03-17 VITALS — BP 99/66 | HR 83 | Ht 71.0 in | Wt 244.4 lb

## 2024-03-17 DIAGNOSIS — I5022 Chronic systolic (congestive) heart failure: Secondary | ICD-10-CM

## 2024-03-17 DIAGNOSIS — I4819 Other persistent atrial fibrillation: Secondary | ICD-10-CM

## 2024-03-17 MED FILL — Continuous Glucose System Sensor: 30 days supply | Qty: 2 | Fill #1 | Status: AC

## 2024-03-17 NOTE — Patient Instructions (Addendum)
 Medication Instructions:  Your physician has recommended you make the following change in your medication:  1) STOP taking Coreg  (carvedilol ) and Aldactone  (spironolactone )   *If you need a refill on your cardiac medications before your next appointment, please call your pharmacy*  Testing/Procedures: Event Monitor  Your physician has recommended that you wear an event monitor. Event monitors are medical devices that record the heart's electrical activity. Doctors most often us  these monitors to diagnose arrhythmias. Arrhythmias are problems with the speed or rhythm of the heartbeat. The monitor is a small, portable device. You can wear one while you do your normal daily activities. This is usually used to diagnose what is causing palpitations/syncope (passing out).  Follow-Up: At [facility], you and your health needs are our priority.  As part of our continuing mission to provide you with exceptional heart care, our providers are all part of one team.  This team includes your primary Cardiologist (physician) and Advanced Practice Providers or APPs (Physician Assistants and Nurse Practitioners) who all work together to provide you with the care you need, when you need it.  Your next appointment:   2-3 weeks  Provider:   Suzann Riddle, NP   Follow up with Dr. Cindie in 6-8 weeks   ZIO XT- Long Term Monitor Instructions  Your physician has requested you wear a ZIO patch monitor for 14 days.  This is a single patch monitor. [employer] supplies one patch monitor per enrollment. Additional stickers are not available. Please do not apply patch if you will be having a Nuclear Stress Test,  Echocardiogram, Cardiac CT, MRI, or Chest Xray during the period you would be wearing the  monitor. The patch cannot be worn during these tests. You cannot remove and re-apply the  ZIO XT patch monitor.  Your ZIO patch monitor will be mailed 3 day USPS to your address on file. It may take 3-5 days   to receive your monitor after you have been enrolled.  Once you have received your monitor, please review the enclosed instructions. Your monitor  has already been registered assigning a specific monitor serial # to you.  Billing and Patient Assistance Program Information  We have supplied [employer] with any of your insurance information on file for billing purposes. [employer] offers a sliding scale Patient Assistance Program for patients that do not have  insurance, or whose insurance does not completely cover the cost of the ZIO monitor.  You must apply for the Patient Assistance Program to qualify for this discounted rate.  To apply, please call [employer] at [phone], select option 4, select option 2, ask to apply for  Patient Assistance Program. Meredeth will ask your household income, and how many people  are in your household. They will quote your out-of-pocket cost based on that information.  [employer] will also be able to set up a 46-month, interest-free payment plan if needed.  Applying the monitor   Shave hair from upper left chest.  Hold abrader disc by orange tab. Rub abrader in 40 strokes over the upper left chest as  indicated in your monitor instructions.  Clean area with 4 enclosed alcohol pads. Let dry.  Apply patch as indicated in monitor instructions. Patch will be placed under collarbone on left  side of chest with arrow pointing upward.  Rub patch adhesive wings for 2 minutes. Remove white label marked 1. Remove the white  label marked 2. Rub patch adhesive wings for 2 additional minutes.  While looking in a mirror,  press and release button in center of patch. A small green light will  flash 3-4 times. This will be your only indicator that the monitor has been turned on.  Do not shower for the first 24 hours. You may shower after the first 24 hours.  Press the button if you feel a symptom. You will hear a small click. Record Date, Time and  Symptom in the Patient  Logbook.  When you are ready to remove the patch, follow instructions on the last 2 pages of Patient  Logbook. Stick patch monitor onto the last page of Patient Logbook.  Place Patient Logbook in the blue and white box. Use locking tab on box and tape box closed  securely. The blue and white box has prepaid postage on it. Please place it in the mailbox as  soon as possible. Your physician should have your test results approximately 7 days after the  monitor has been mailed back to [facility].  Call [facility] Customer Care at [phone] if you have questions regarding  your ZIO XT patch monitor. Call them immediately if you see an orange light blinking on your  monitor.  If your monitor falls off in less than 4 days, contact our Monitor department at [phone].  If your monitor becomes loose or falls off after 4 days call [employer] at [phone] for  suggestions on securing your monitor

## 2024-03-18 NOTE — Addendum Note (Signed)
 Addended by: GRETEL MAEOLA CROME on: 03/18/2024 11:42 AM   Modules accepted: Orders

## 2024-03-19 ENCOUNTER — Telehealth: Payer: Self-pay

## 2024-03-19 ENCOUNTER — Other Ambulatory Visit (HOSPITAL_COMMUNITY): Payer: Self-pay

## 2024-03-19 ENCOUNTER — Telehealth: Payer: Self-pay | Admitting: Emergency Medicine

## 2024-03-19 DIAGNOSIS — Z79899 Other long term (current) drug therapy: Secondary | ICD-10-CM

## 2024-03-19 MED ORDER — REPATHA SURECLICK 140 MG/ML ~~LOC~~ SOAJ: 140.0000 mg | SUBCUTANEOUS | 3 refills | Status: DC

## 2024-03-19 NOTE — Telephone Encounter (Addendum)
 Pharmacy Patient Advocate Encounter   Received notification from Physician's Office that prior authorization for REPATHA  is required/requested.   Insurance verification completed.   The patient is insured through [facility] .   Per test claim: PA required; PA submitted to above mentioned insurance via CoverMyMeds Key/confirmation #/EOC [id number] Status is pending

## 2024-03-19 NOTE — Telephone Encounter (Signed)
 Called patient in regards to the following from Dr. Gollan.      --- Message from Timothy Gollan sent at 03/17/2024  7:27 AM EDT ----- Regarding: RE: Rosuvastatin  Dosing His lipids are not at goal He can reduce the Crestor  down to 20 but will need to start Repatha    Olivia can we contact him, recommend Repatha  140 every 2 weeks Thx TGollan    Patient verbalized understanding. Patient states that he will try Repatha  but it was not approved by his insurance in the past. Prescription sent to preferred pharmacy. Forwarding to prior authorization.

## 2024-03-19 NOTE — Telephone Encounter (Signed)
-----   Message from [name] sent at 03/17/2024  7:27 AM EDT ----- Regarding: RE: Rosuvastatin  Dosing His lipids are not at goal He can reduce the Crestor  down to 20 but will need to start Repatha   Olivia can we contact him, recommend Repatha  140 every 2 weeks Thx TGollan ----- Message ----- From: [name] LABOR, FNP Sent: 03/15/2024   5:03 PM EDT To: Evalene JINNY Lunger, MD Subject: Rosuvastatin  Dosing                            Good Evening Dr. Lunger,   I am Mr. Eschmann's PCP. His GI provider, Dr. 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 Pharmacy is recommending reducing his rosuvastatin  dose to 20mg  daily, as Rezdiffra  increases its drug level. Okay to reduce rosuvastatin  to 20mg  daily?  Thank you,  Kellie

## 2024-03-19 NOTE — Telephone Encounter (Signed)
 PA request has been Submitted. New Encounter has been or will be created for follow up. For additional info see Pharmacy Prior Auth telephone encounter from 03/19/24.

## 2024-03-23 NOTE — Telephone Encounter (Signed)
 Pharmacy Patient Advocate Encounter  Received notification from [facility] that Prior Authorization for REPATHA  has been DENIED.  Full denial letter will be uploaded to the media tab. See denial reason below.   PER PLAN MUST BE TAKING THE HIGHEST DOSE OF STATIN & HAVE COMPLETED 90 DAYS OF TREATMENT

## 2024-03-23 NOTE — Telephone Encounter (Signed)
 Scanned in media.

## 2024-03-24 ENCOUNTER — Telehealth: Payer: Self-pay

## 2024-03-24 ENCOUNTER — Other Ambulatory Visit: Payer: Self-pay | Admitting: Family Medicine

## 2024-03-24 DIAGNOSIS — I2510 Atherosclerotic heart disease of native coronary artery without angina pectoris: Secondary | ICD-10-CM

## 2024-03-24 MED ORDER — ROSUVASTATIN CALCIUM 20 MG PO TABS: 20.0000 mg | ORAL_TABLET | Freq: Every day | ORAL | 3 refills | Status: AC

## 2024-03-24 NOTE — Addendum Note (Signed)
 Addended by: CRISTOPHER OLIVIA PARAS on: 03/24/2024 08:14 AM   Modules accepted: Orders

## 2024-03-24 NOTE — Telephone Encounter (Signed)
 Called patient and notified him of th following from Dr. Gollan.  Repatha  denied Would recommend he stay on Crestor  40 daily with the Zetia  Recheck lipid panel in October/November If numbers not at goal, at that time he may qualify for Repatha  Thx TGollan  Patient verbalizes understanding. Order placed for Lipid Panel.

## 2024-03-26 ENCOUNTER — Telehealth: Payer: Self-pay

## 2024-03-26 ENCOUNTER — Ambulatory Visit

## 2024-03-26 NOTE — Telephone Encounter (Signed)
 Patient came into the office, stating that since last Wednesday when doctor told him to hold his BP medications, his blood pressure has went back up.   He is currently running 160-170/90's and the last few days he has started back with having a headache. He stated he was going home and restarting the Carvedilol  and Spironolactone , but wanted to see if MD recommended lower dosage or what other recommendations, he wanted to try and get rid of this headache.   I advised him I would send a message back and call back with recommendations.   Patient thankful for time.

## 2024-03-29 NOTE — Telephone Encounter (Signed)
 Called patient, made aware of the updates from MD.   Patient verbalized understanding, aware to keep upcoming appointment with Suzann Riddle, NP on 08/19 to review as well.

## 2024-04-02 ENCOUNTER — Other Ambulatory Visit (HOSPITAL_COMMUNITY): Payer: Self-pay

## 2024-04-02 ENCOUNTER — Encounter: Payer: Self-pay | Admitting: Family Medicine

## 2024-04-02 ENCOUNTER — Ambulatory Visit (INDEPENDENT_AMBULATORY_CARE_PROVIDER_SITE_OTHER): Admitting: Family Medicine

## 2024-04-02 ENCOUNTER — Ambulatory Visit: Payer: Self-pay | Admitting: Family Medicine

## 2024-04-02 ENCOUNTER — Telehealth: Payer: Self-pay

## 2024-04-02 VITALS — BP 97/66 | HR 87 | Resp 20 | Ht 71.0 in | Wt 251.4 lb

## 2024-04-02 DIAGNOSIS — R197 Diarrhea, unspecified: Secondary | ICD-10-CM | POA: Diagnosis not present

## 2024-04-02 DIAGNOSIS — Z794 Long term (current) use of insulin: Secondary | ICD-10-CM

## 2024-04-02 DIAGNOSIS — B351 Tinea unguium: Secondary | ICD-10-CM | POA: Diagnosis not present

## 2024-04-02 DIAGNOSIS — N1831 Chronic kidney disease, stage 3a: Secondary | ICD-10-CM | POA: Diagnosis not present

## 2024-04-02 DIAGNOSIS — E1122 Type 2 diabetes mellitus with diabetic chronic kidney disease: Secondary | ICD-10-CM

## 2024-04-02 DIAGNOSIS — E1169 Type 2 diabetes mellitus with other specified complication: Secondary | ICD-10-CM

## 2024-04-02 DIAGNOSIS — E785 Hyperlipidemia, unspecified: Secondary | ICD-10-CM

## 2024-04-02 DIAGNOSIS — E1165 Type 2 diabetes mellitus with hyperglycemia: Secondary | ICD-10-CM

## 2024-04-02 LAB — POCT GLYCOSYLATED HEMOGLOBIN (HGB A1C)
Hemoglobin A1C: 8.8 % — AB (ref 4.0–5.6)
PT: 206

## 2024-04-02 MED ORDER — EFINACONAZOLE 10 % EX SOLN: 1.0000 [drp] | Freq: Every day | CUTANEOUS | 0 refills | Status: DC

## 2024-04-02 MED ORDER — NOVOLOG FLEXPEN RELION 100 UNIT/ML ~~LOC~~ SOPN: 14.0000 [IU] | PEN_INJECTOR | Freq: Three times a day (TID) | SUBCUTANEOUS | 3 refills | Status: DC

## 2024-04-02 MED ORDER — TIRZEPATIDE 7.5 MG/0.5ML ~~LOC~~ SOAJ
7.5000 mg | SUBCUTANEOUS | 1 refills | Status: DC
Start: 2024-04-13 — End: 2024-06-02

## 2024-04-02 NOTE — Progress Notes (Signed)
 Established Patient Office Visit  Introduced to nurse practitioner role and practice setting.  All questions answered.  Discussed provider/patient relationship and expectations.  Subjective   Patient ID: Gabriel [name]., male    DOB: 13-Oct-1970  Age: 53 y.o. MRN: [id number]  Chief Complaint  Patient presents with   Follow-up   Diabetes    Discussed the use of AI scribe software for clinical note transcription with the patient, who gave verbal consent to proceed.  History of Present Illness Gabriel [name]. is a 53 year old male with complex cardiac hx, COPD, OSA, MASH, DMII, CKD - here for A1C check  Cards - had been experiencing dizziness and headaches, which he associates with his blood pressure medication, cardiology holding coreg . Was just on Zio patch - mailing in today. Cards appt on 8/19   DMII - he reports fluctuating blood sugar levels, poc today = 8.8, previous 6.9. He has an upcoming endocrinologist appointment. He recently switched from ozempic  to Mounjaro  and is currently on a 5 mg dose. His blood sugar readings have been variable, with some as low as 130 mg/dL and others significantly higher, 250s. He uses a [employer] sensor to monitor glucose levels, which have shown an average of 214 mg/dL over the past week. Blood sugar tends to rise midday and has been as high as 500 mg/dL - per patient in last month.  His diet includes a bowl of Cheerios or an egg sandwich for breakfast, with occasional snacks like apples or grapes. He has been trying to manage his weight, noting a recent increase of 7 pounds. He is on 40 mg of Torsemide  and takes an extra dose when his weight increases.  He has a history of fatty liver noted on an abdominal ultrasound a year ago. He is concerned about his liver, pancreas, and kidneys contributing to his current health issues. He has been experiencing increased diarrhea and manages his insulin  doses, currently taking 12 units before meals with  additional corrective doses as needed.  No low glucose events. Reports increased diarrhea, denies constipation. No falls or injuries.      04/02/2024    8:46 AM 03/12/2024    9:55 AM 01/01/2024    8:42 AM  Depression screen PHQ 2/9  Decreased Interest 1 0 0  Down, Depressed, Hopeless 0 0 0  PHQ - 2 Score 1 0 0  Altered sleeping 1  2  Tired, decreased energy 3  3  Change in appetite 3  1  Feeling bad or failure about yourself  0  0  Trouble concentrating 1  0  Moving slowly or fidgety/restless 0  0  Suicidal thoughts 0  0  PHQ-9 Score 9  6  Difficult doing work/chores Somewhat difficult  Not difficult at all       04/02/2024    8:47 AM 01/01/2024    8:43 AM 11/20/2023   10:46 AM 04/16/2023   11:06 AM  GAD 7 : Generalized Anxiety Score  Nervous, Anxious, on Edge 0 0 0 2  Control/stop worrying 0 0 0 2  Worry too much - different things 1 0 0 3  Trouble relaxing 0 1 0 3  Restless 0 0 0 1  Easily annoyed or irritable 1 0 0 3  Afraid - awful might happen 0 0 0 3  Total GAD 7 Score 2 1 0 17  Anxiety Difficulty Somewhat difficult Not difficult at all Not difficult at all Somewhat difficult  ROS  Negative unless indicated in HPI   Objective:     BP 97/66 (BP Location: Left Arm, Patient Position: Sitting, Cuff Size: Large)   Pulse 87   Resp 20   Ht 5' 11 (1.803 m)   Wt 251 lb 6.4 oz (114 kg)   SpO2 96%   BMI 35.06 kg/m    Physical Exam Constitutional:      General: He is not in acute distress.    Appearance: Normal appearance. He is not ill-appearing, toxic-appearing or diaphoretic.  HENT:     Head: Normocephalic.     Nose: Nose normal.     Mouth/Throat:     Mouth: Mucous membranes are moist.  Eyes:     Extraocular Movements: Extraocular movements intact.     Conjunctiva/sclera: Conjunctivae normal.     Pupils: Pupils are equal, round, and reactive to light.  Cardiovascular:     Rate and Rhythm: Normal rate and regular rhythm.     Pulses:           Dorsalis pedis pulses are 2+ on the right side and 2+ on the left side.       Posterior tibial pulses are 2+ on the right side and 2+ on the left side.     Heart sounds: No murmur heard.    No friction rub. No gallop.  Pulmonary:     Effort: Pulmonary effort is normal. No respiratory distress.     Breath sounds: Normal breath sounds. No stridor. No wheezing, rhonchi or rales.  Chest:     Chest wall: No tenderness.  Musculoskeletal:     Right lower leg: No edema.     Left lower leg: No edema.     Right foot: Normal range of motion. No deformity, bunion, Charcot foot or foot drop.     Left foot: Normal range of motion. No deformity, bunion, Charcot foot or foot drop.  Feet:     Right foot:     Protective Sensation: 10 sites tested.  10 sites sensed.     Skin integrity: Dry skin present.     Toenail Condition: Right toenails are abnormally thick. Fungal disease present.    Left foot:     Protective Sensation: 10 sites tested.  10 sites sensed.     Skin integrity: Dry skin present.     Toenail Condition: Left toenails are abnormally thick. Fungal disease present.    Comments: Bilateral thick, fungal great toes Skin:    General: Skin is warm and dry.     Capillary Refill: Capillary refill takes less than 2 seconds.  Neurological:     General: No focal deficit present.     Mental Status: He is alert and oriented to person, place, and time. Mental status is at baseline.     Cranial Nerves: No cranial nerve deficit.     Sensory: No sensory deficit.     Motor: No weakness.     Coordination: Coordination normal.     Gait: Gait normal.    Results for orders placed or performed in visit on 04/02/24  POCT HgB A1C  Result Value Ref Range   Hemoglobin A1C 8.8 (A) 4.0 - 5.6 %   PT 206       The ASCVD Risk score (Arnett DK, et al., 2019) failed to calculate for the following reasons:   Risk score cannot be calculated because patient has a medical history suggesting prior/existing  ASCVD    Assessment & Plan:  Type 2  diabetes mellitus with stage 3a chronic kidney disease, without long-term current use of insulin  (HCC) -     POCT glycosylated hemoglobin (Hb A1C) -     Microalbumin / creatinine urine ratio  Frequent diarrhea  Uncontrolled diabetes mellitus with hyperglycemia, with long-term current use of insulin  (HCC) -     Tirzepatide ; Inject 7.5 mg into the skin once a week.  Dispense: 6 mL; Refill: 1 -     US  Abdomen Complete  Type 2 diabetes mellitus with hyperlipidemia (HCC) -     NovoLOG  FlexPen ReliOn; Inject 14 Units into the skin 3 (three) times daily with meals. INJECT 14 UNITS + CORRECTION BEFORE BREAKFAST/LUNCH AND SUPPER (CORRECTION = 1 UNIT PER 50 MG/DL ABOVE 849); MAX DAILY DOSE 57 UNITS  Dispense: 15 mL; Refill: 3  Onychomycosis of great toe -     Efinaconazole ; Apply 1 drop topically daily. To both great toes  Dispense: 8 mL; Refill: 0     Assessment and Plan Assessment & Plan Type 2 diabetes mellitus with poor glycemic control and diabetic foot care Type 2 diabetes mellitus with poor glycemic control, evidenced by an A1c of 8.8%. Recent transition from Prozempic to Mounjaro  with current dose at 5 mg. Blood glucose levels have been fluctuating, with recent averages showing improvement but still elevated. Concerns about potential underlying issues with liver, pancreas, or kidneys contributing to poor control. Diabetic foot care is necessary due to peeling and potential fungal infection on toes. - Increase Mounjaro  to 7.5 mg after completing current doses of 5 mg. - Increase mealtime insulin  to 14 units, plus corrective dos, MAX 57 units per day - Continue Glargine 36 units nightly. - Order abdominal ultrasound to assess for any changes in liver, pancreas, or kidneys. - Refer to endocrinology for further management - appt 05/06/24 - Nutritionist appt on 8/19 - on statin and entresto  - Continue Freestyle libre Plus 3 - goal fasting 80 -130;  - Foot  exam done today - Toe fungus present bilateral great toes - pt to schedule with podiatry at [facility].  Dizziness and hypotension - denies today, BP stable - Managed by Cards - Zio patch sent in  - continue to hold coreg  until cards appt - cards manages all of cardiology, HTN, CAD meds  Chronic diarrhea Chronic diarrhea, possibly related to medication side effects. \ - Increase water intake as allowed by cards, 64 ounces - May take fiber supplement - US  abdominal US  to rule out any changes from previous imaging - Endocrine appt 05/06/24  Onychomycosis- bilateral great toes Onychomycosis with peeling and potential fungal infection on toes. No current antifungal treatment in place. - efinaconazole  topical to great toes daily - Encourage follow-up with podiatry at [facility] for further treatment/assesment  Return in about 4 months (around 08/02/2024) for Chronic Disease mgmt.   I, Curtis DELENA Boom, FNP, have reviewed all documentation for this visit. The documentation on 04/02/24 for the exam, diagnosis, procedures, and orders are all accurate and complete.  Curtis DELENA Boom, FNP

## 2024-04-02 NOTE — Telephone Encounter (Signed)
 Pharmacy Patient Advocate Encounter   Received notification from Onbase that prior authorization for Jublia  10% solution  is required/requested.   Insurance verification completed.   The patient is insured through [facility] .   Per test claim: PA required; PA submitted to above mentioned insurance via Latent Key/confirmation #/EOC [id number] Status is pending

## 2024-04-03 LAB — MICROALBUMIN / CREATININE URINE RATIO
Creatinine, Urine: 49.3 mg/dL
Microalb/Creat Ratio: 43 mg/g{creat} — ABNORMAL HIGH (ref 0–29)
Microalbumin, Urine: 21.4 ug/mL

## 2024-04-05 NOTE — Progress Notes (Unsigned)
 Electrophysiology Clinic Note    Date:  04/06/2024  Patient ID:  Gabriel Shams., DOB September 15, 1970, MRN [id number] PCP:  [name] LABOR, FNP  Cardiologist:  [name], MD   Electrophysiologist:  [name], MD  Electrophysiology APP:  Gabriel Pryor, NP   HF cardiologist - Bensimhon   Discussed the use of AI scribe software for clinical note transcription with the patient, who gave verbal consent to proceed.   Patient Profile    Chief Complaint: LH follow-up  History of Present Illness: Gabriel Roudebush. is a 53 y.o. 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    He was started on amiodarone  10/2023 while admitted with afib w RVR along with SOB and dizziness. He saw Dr. HOLTS 12/2023 to re-discuss candidacy for AF ablation where he described improvement in his breathing since hospitalization but it had worsened over the past month. Significant murmur on exam, so planned updated TTE to eval AI/AR. He was in sinus at that visit.   He again saw Dr. HOLTS 02/2024 where he c/o frequent episodes of LH, dizziness, sometimes associated with palpitations, also BP decreasing. Recommended updated zio to confirm sinus rhythm and close BP monitoring at home, coreg  and spiro stopped.   He walked into [facility] cards office with elevated BP (160-170 sys) with headache. Spiro was restarted, coreg  cont to be held.   On follow-up today, he states that when he walked into clinic, his BP was not high and that he only had a headache. He did restart his spiro 25mg  daily and is not taking coreg .  He continues to have intermittent dizziness throughout the day, not constant, not associated with position changes. He checked his BP this AM and it was 120s/70s and he was not dizzy. He did become a little dizzy with walking to exam room, but it  resolved quickly.  He is having ongoing abd bloating with weight steadily increasing. He is taking torsemide  60mg  daily d/t increase weight, his usual diuretic dose is 40mg  daily. He believes something is wrong with his GI system causing the abd bloating and has abd ultrasound scheduled for tomorrow.  He denies chest pain, chest pressure, palpitations, lower extremity edema, SOB or cough.   He continues to take 24-26 entresto  BID, 25mg  spiro daily, 10mg  farxiga , and 40mg  torsemide  daily (additional 20mg  PRN)  He continues to take elqiuis BID, no bleeding concerns. Continues to take amiodarone  daily.     Arrhythmia/Device History Bos Sci s-ICD, imp 05/2022; dx ICM    ROS:  Please see the history of present illness. All other systems are reviewed and otherwise negative.    Physical Exam    VS:  BP 90/62 (BP Location: Left Arm, Patient Position: Sitting, Cuff Size: Large)   Pulse 88   Ht 5' 11 (1.803 m)   Wt 249 lb 6.4 oz (113.1 kg)   SpO2 94%   BMI 34.78 kg/m  BMI: Body mass index is 34.78 kg/m.  Orthostatic VS for the past 24 hrs (Last 3 readings):  BP- Lying Pulse- Lying BP- Sitting Pulse- Sitting BP- Standing at 0 minutes Pulse- Standing at 0 minutes BP- Standing at 3 minutes Pulse- Standing at 3 minutes  04/06/24 1136 95/67 90 100/70 92 110/74 99 106/73 95     Wt Readings from Last  3 Encounters:  04/06/24 249 lb 6.4 oz (113.1 kg)  04/02/24 251 lb 6.4 oz (114 kg)  03/17/24 244 lb 6.4 oz (110.9 kg)     GEN- The patient is well appearing, alert and oriented x 3 today.   Lungs- Clear to ausculation bilaterally, normal work of breathing.  Heart- Regular rate and rhythm, no murmurs, rubs or gallops Abd - nontender, distended but easily compressible Extremities- No peripheral edema, warm, dry Skin-  device pocket well-healed, no tethering   Device interrogation done today and reviewed by myself:  Battery good No episodes Stable impedence  Studies Reviewed    Previous EP, cardiology notes.    EKG is ordered. Personal review of EKG from today shows:    EKG Interpretation Date/Time:  Tuesday April 06 2024 11:23:58 EDT Ventricular Rate:  88 PR Interval:  170 QRS Duration:  106 QT Interval:  396 QTC Calculation: 479 R Axis:   11  Text Interpretation: likely Normal sinus rhythm , significant baseline artifact Incomplete left bundle branch block Nonspecific ST abnormality Confirmed by Ramir Malerba [phone]) on 04/06/2024 3:40:47 PM    TTE, 01/02/2024  1. Left ventricular ejection fraction, by estimation, is 35 to 40%. Left  ventricular ejection fraction by 2D MOD biplane is 35.0 %. Left  ventricular ejection fraction by PLAX is 36 %. The left ventricle has  moderately decreased function. The left ventricle demonstrates global hypokinesis. There is mild left ventricular hypertrophy. Left ventricular diastolic function could not be evaluated.   2. Right ventricular systolic function is low normal. The right  ventricular size is normal.   3. Left atrial size was mildly dilated.   4. The mitral valve is normal in structure. No evidence of mitral valve regurgitation.   5. The aortic valve is tricuspid. Aortic valve regurgitation is mild to  moderate.   6. Aortic dilatation noted. There is mild dilatation of the aortic root,  measuring 40 mm.   7. The inferior vena cava is normal in size with greater than 50%  respiratory variability, suggesting right atrial pressure of 3 mmHg.   Long term zio, 12/30/2023 1. Sinus rhythm - avg HR of 76 bpm. 2. Five runs of nonsustained Ventricular Tachycardia occurred, the run with the fastest interval lasting 4 beats with a max rate of 182 bpm, the longest lasting 5 beats with an avg rate of 119 bpm.  3. Rare PACs and PVCs. 4. Two patient-triggered events associated with sinus rhythm    Assessment and Plan    #) LH, dizziness #) HTN, hypotension #) persis AFib #) HFrEF #) amiodarone  monitoring Last  saw Dr. Cindie two weeks ago where he c/o LH, dizziness, hypotension. Coreg  and spiro both stopped. He notified office that his BP was quite elevated, and spiro restarted last week. BP continues to be low with intermittent dizziness. No syncope or presyncope.   His main complaint is abd bloating/distention. We discussed that this could be d/t worsening fluid accumulation d/t his HF. He strongly believes it has a GI cause and wants to wait to make any medication changes until after Abd ultrasound tomorrow.  We further discussed stopping entresto  or reducing to 12/13mg  BID, he is very hesitant to do this. Given that he has no syncope or presyncope, agreed to continue entresto  24/26 with very close follow-up with HF NP next week. Update CMP, mag, thyroid  labs, BNP today to further eval GI   #) Hypercoag d/t persis afib CHA2DS2-VASc Score = at least 6 [CHF History: 1,  HTN History: 1, Diabetes History: 1, Stroke History: 2, Vascular Disease History: 1, Age Score: 0, Gender Score: 0].  Therefore, the patient's annual risk of stroke is 9.7 %.    Stroke ppx - 20mg  xarelto  daily, appropriately dosed No bleeding concerns          Current medicines are reviewed at length with the patient today.   The patient has concerns regarding his medicines.  The following changes were made today:  none  Labs/ tests ordered today include:  Orders Placed This Encounter  Procedures   Comprehensive metabolic panel with GFR   Magnesium    TSH   T4, free   Brain natriuretic peptide   EKG 12-Lead     Disposition: Follow up with Dr. Cindie  as scheduled   Follow-up with HF NP in 1 week to re-eval GDMT, diuretic doses   Signed, Olar Santini, NP  04/06/24  3:41 PM  Electrophysiology [facility]

## 2024-04-06 ENCOUNTER — Encounter: Attending: Physician Assistant | Admitting: Dietician

## 2024-04-06 ENCOUNTER — Other Ambulatory Visit: Payer: Self-pay | Admitting: Family Medicine

## 2024-04-06 ENCOUNTER — Encounter: Payer: Self-pay | Admitting: Dietician

## 2024-04-06 ENCOUNTER — Ambulatory Visit: Attending: Cardiology | Admitting: Cardiology

## 2024-04-06 VITALS — BP 90/62 | HR 88 | Ht 71.0 in | Wt 249.4 lb

## 2024-04-06 DIAGNOSIS — E0822 Diabetes mellitus due to underlying condition with diabetic chronic kidney disease: Secondary | ICD-10-CM | POA: Diagnosis not present

## 2024-04-06 DIAGNOSIS — B351 Tinea unguium: Secondary | ICD-10-CM

## 2024-04-06 DIAGNOSIS — I10 Essential (primary) hypertension: Secondary | ICD-10-CM | POA: Diagnosis not present

## 2024-04-06 DIAGNOSIS — R42 Dizziness and giddiness: Secondary | ICD-10-CM | POA: Diagnosis not present

## 2024-04-06 DIAGNOSIS — N1832 Chronic kidney disease, stage 3b: Secondary | ICD-10-CM | POA: Insufficient documentation

## 2024-04-06 DIAGNOSIS — Z713 Dietary counseling and surveillance: Secondary | ICD-10-CM | POA: Insufficient documentation

## 2024-04-06 DIAGNOSIS — R140 Abdominal distension (gaseous): Secondary | ICD-10-CM | POA: Diagnosis not present

## 2024-04-06 DIAGNOSIS — Z95810 Presence of automatic (implantable) cardiac defibrillator: Secondary | ICD-10-CM | POA: Diagnosis not present

## 2024-04-06 DIAGNOSIS — I4819 Other persistent atrial fibrillation: Secondary | ICD-10-CM | POA: Insufficient documentation

## 2024-04-06 DIAGNOSIS — I959 Hypotension, unspecified: Secondary | ICD-10-CM | POA: Insufficient documentation

## 2024-04-06 DIAGNOSIS — Z794 Long term (current) use of insulin: Secondary | ICD-10-CM | POA: Insufficient documentation

## 2024-04-06 DIAGNOSIS — I5020 Unspecified systolic (congestive) heart failure: Secondary | ICD-10-CM | POA: Diagnosis not present

## 2024-04-06 MED ORDER — CICLOPIROX 8 % EX SOLN: Freq: Every day | CUTANEOUS | 0 refills | Status: DC

## 2024-04-06 NOTE — Patient Instructions (Addendum)
 Increase your water intake daily, try to spread this out throughout the day in small 8-12 oz servings. If you notice weight gain of 3 or more lbs. in a day, decrease your water intake and adjust your torsemide  as prescribed.  Following your sliding scale for mealtime insulin  and take the entire dose prior to eating.  Choose low fat proteins/animal products. Choose low-fat ground beef (93/7), use only 1 egg yolk when scrambling eggs.  Keep your serving size of fruit to the size of a baseball.  Walk for a total of 30 minutes each day, start by taking short walks of 10 minutes at a time. Take a bottle of water with you and sip on it during your walk.

## 2024-04-06 NOTE — Telephone Encounter (Signed)
 Pharmacy Patient Advocate Encounter  Received notification from [facility] that Prior Authorization for Jublia  10% solution  has been DENIED.  Full denial letter will be uploaded to the media tab. See denial reason below.   PA #/Case ID/Reference #: [id number]

## 2024-04-06 NOTE — Patient Instructions (Addendum)
 Medication Instructions:  The current medical regimen is effective;  continue present plan and medications as directed. Please refer to the Current Medication list given to you today.   *If you need a refill on your cardiac medications before your next appointment, please call your pharmacy*  Lab Work: Your provider would like for you to have following labs drawn today CMET, MAG, TSH, T4, BNP.   If you have labs (blood work) drawn today and your tests are completely normal, you will receive your results only by: MyChart Message (if you have MyChart) OR A paper copy in the mail If you have any lab test that is abnormal or we need to change your treatment, we will call you to review the results.  Follow-Up: At [facility], you and your health needs are our priority.  As part of our continuing mission to provide you with exceptional heart care, our providers are all part of one team.  This team includes your primary Cardiologist (physician) and Advanced Practice Providers or APPs (Physician Assistants and Nurse Practitioners) who all work together to provide you with the care you need, when you need it.  Your next appointment:   With Ellouise Class, NP on 8/26 at 8:30 AM   We recommend signing up for the patient portal called MyChart.  Sign up information is provided on this After Visit Summary.  MyChart is used to connect with patients for Virtual Visits (Telemedicine).  Patients are able to view lab/test results, encounter notes, upcoming appointments, etc.  Non-urgent messages can be sent to your provider as well.   To learn more about what you can do with MyChart, go to [email or web address].    Monitor blood pressure at home.

## 2024-04-06 NOTE — Progress Notes (Signed)
 Diabetes Self-Management Education  Visit Type: First/Initial  Appt. Start Time: 0855 Appt. End Time: 1000  04/06/2024  [name], identified by name and date of birth, is a 53 y.o. male with a diagnosis of Diabetes: Type 2.   ASSESSMENT Pt reports weight gain/increased A1c since January, states they have no idea why, not eating differently and will experience hyperglycemia and has difficulty getting glucose in range. Pt CBG on CGM - 288 mg/dL during visit, verified with finger stick to be 282 mg/dL, states they had a breakfast sandwich about an hour ago. Pt reports bloating ~10 minutes after eating, states this has been going on for the past couple of years. Pt reports bouts of blurry vision, states they have to pull over when driving at times. Pt reports starting Mounjaro  about 3 weeks ago, Basaglar  @36u  at bedtime, Novolog  @14u  TID w/ meals, and will take correction factor after eating if glucose stays high, no hypoglycemia. Pt reports using Freestyle Libre3 CGM CGM Results from download:   Average glucose:   219 mg/dL for 89 days  Time in range (70-180 mg/dL):   28 %   (Goal >29%)  Time High (181-250 mg/dL):   46 %   (Goal < 74%)  Time Very High (>250 mg/dL):    26 %   (Goal < 5%)    Diabetes Self-Management Education - 04/06/24 1030       Visit Information   Visit Type First/Initial      Initial Visit   Diabetes Type Type 2    Are you currently following a meal plan? Yes    What type of meal plan do you follow? Heart Healthy    Are you taking your medications as prescribed? Yes      Health Coping   How would you rate your overall health? Fair      Psychosocial Assessment   Patient Belief/Attitude about Diabetes Defeat/Burnout    What is the hardest part about your diabetes right now, causing you the most concern, or is the most worrisome to you about your diabetes?   Taking/obtaining medications;Getting support / problem solving    Other persons present Patient     Patient Concerns Glycemic Control;Medication;Support    Special Needs None    Preferred Learning Style No preference indicated    Learning Readiness Ready    How often do you need to have someone help you when you read instructions, pamphlets, or other written materials from your doctor or pharmacy? 1 - Never    What is the last grade level you completed in school? 12th grade      Complications   Last HgB A1C per patient/outside source 8.8 %   04/02/2024   How often do you check your blood sugar? > 4 times/day    Fasting Blood glucose range (mg/dL) >799    Postprandial Blood glucose range (mg/dL) >799    Number of hyperglycemic episodes ( >200mg /dL): Daily    Can you tell when your blood sugar is high? Yes    Have you had a dilated eye exam in the past 12 months? Yes    Have you had a dental exam in the past 12 months? Yes    Are you checking your feet? Yes    How many days per week are you checking your feet? 5      Dietary Intake   Breakfast 3 scrambled eggs, hamburger patty    Lunch Chicken wrap w/lettuce, tomato, mayo  Dinner Art gallery manager) Water      Activity / Exercise   Activity / Exercise Type ADL's    How many days per week do you exercise? 0    How many minutes per day do you exercise? 0    Total minutes per week of exercise 0      Patient Education   Previous Diabetes Education Yes    Disease Pathophysiology Factors that contribute to the development of diabetes;Explored patient's options for treatment of their diabetes    Healthy Eating Role of diet in the treatment of diabetes and the relationship between the three main macronutrients and blood glucose level;Meal options for control of blood glucose level and chronic complications.    Being Active Helped patient identify appropriate exercises in relation to his/her diabetes, diabetes complications and other health issue.    Medications Reviewed patients medication for diabetes, action, purpose,  timing of dose and side effects.;Taught/reviewed insulin /injectables, injection, site rotation, insulin /injectables storage and needle disposal.    Monitoring Taught/evaluated CGM (comment)    Acute complications Discussed and identified patients' prevention, symptoms, and treatment of hyperglycemia.    Chronic complications Relationship between chronic complications and blood glucose control    Diabetes Stress and Support Role of stress on diabetes;Identified and addressed patients feelings and concerns about diabetes;Worked with patient to identify barriers to care and solutions      Individualized Goals (developed by patient)   Nutrition Follow meal plan discussed    Physical Activity 30 minutes per day    Medications take my medication as prescribed    Monitoring  Consistenly use CGM    Problem Solving Medication consistency    Reducing Risk examine blood glucose patterns    Health Coping Ask for help with psychological, social, or emotional issues      Post-Education Assessment   Patient understands the diabetes disease and treatment process. Comprehends key points    Patient understands incorporating nutritional management into lifestyle. Comprehends key points    Patient undertands incorporating physical activity into lifestyle. Comprehends key points    Patient understands using medications safely. Comphrehends key points    Patient understands monitoring blood glucose, interpreting and using results Comprehends key points    Patient understands prevention, detection, and treatment of acute complications. Comprehends key points    Patient understands prevention, detection, and treatment of chronic complications. Comprehends key points    Patient understands how to develop strategies to address psychosocial issues. Comprehends key points    Patient understands how to develop strategies to promote health/change behavior. Comprehends key points      Outcomes   Expected Outcomes  Demonstrated interest in learning. Expect positive outcomes    Future DMSE 2 months    Program Status Not Completed          Individualized Plan for Diabetes Self-Management Training:   Learning Objective:  Patient will have a greater understanding of diabetes self-management. Patient education plan is to attend individual and/or group sessions per assessed needs and concerns.   Plan:   Patient Instructions  Increase your water intake daily, try to spread this out throughout the day in small 8-12 oz servings. If you notice weight gain of 3 or more lbs. in a day, decrease your water intake and adjust your torsemide  as prescribed.  Following your sliding scale for mealtime insulin  and take the entire dose prior to eating.  Choose low fat proteins/animal products. Choose low-fat ground beef (93/7), use only 1 egg  yolk when scrambling eggs.  Keep your serving size of fruit to the size of a baseball.  Walk for a total of 30 minutes each day, start by taking short walks of 10 minutes at a time. Take a bottle of water with you and sip on it during your walk.    Expected Outcomes:  Demonstrated interest in learning. Expect positive outcomes  If problems or questions, patient to contact team via:  Phone and Email  Future DSME appointment: 2 months

## 2024-04-06 NOTE — Telephone Encounter (Signed)
 Sent in Ciclopirox  instead

## 2024-04-06 NOTE — Telephone Encounter (Signed)
 Advised

## 2024-04-07 ENCOUNTER — Other Ambulatory Visit: Payer: Self-pay | Admitting: Pharmacist

## 2024-04-07 ENCOUNTER — Ambulatory Visit
Admission: RE | Admit: 2024-04-07 | Discharge: 2024-04-07 | Disposition: A | Discharge status: Home / Self Care | Source: Ambulatory Visit | Attending: Family Medicine | Admitting: Family Medicine

## 2024-04-07 ENCOUNTER — Ambulatory Visit: Payer: Self-pay | Admitting: Cardiology

## 2024-04-07 ENCOUNTER — Encounter: Admitting: Cardiology

## 2024-04-07 DIAGNOSIS — E1165 Type 2 diabetes mellitus with hyperglycemia: Secondary | ICD-10-CM | POA: Insufficient documentation

## 2024-04-07 DIAGNOSIS — E119 Type 2 diabetes mellitus without complications: Secondary | ICD-10-CM | POA: Diagnosis not present

## 2024-04-07 DIAGNOSIS — R161 Splenomegaly, not elsewhere classified: Secondary | ICD-10-CM | POA: Diagnosis not present

## 2024-04-07 DIAGNOSIS — Z794 Long term (current) use of insulin: Secondary | ICD-10-CM | POA: Insufficient documentation

## 2024-04-07 DIAGNOSIS — J441 Chronic obstructive pulmonary disease with (acute) exacerbation: Secondary | ICD-10-CM

## 2024-04-07 DIAGNOSIS — R932 Abnormal findings on diagnostic imaging of liver and biliary tract: Secondary | ICD-10-CM | POA: Diagnosis not present

## 2024-04-07 DIAGNOSIS — I10 Essential (primary) hypertension: Secondary | ICD-10-CM

## 2024-04-07 DIAGNOSIS — R197 Diarrhea, unspecified: Secondary | ICD-10-CM | POA: Diagnosis not present

## 2024-04-07 NOTE — Progress Notes (Unsigned)
 04/07/2024 Name: Gabriel [name]. MRN: [id number] DOB: 09-24-1970  Chief Complaint  Patient presents with   Medication Management    Gabriel [name]. is a 53 y.o. year old male who presented for a telephone visit.   They were referred to the pharmacist by their PCP for assistance in managing diabetes.      Subjective:   Care Team: Primary Care Provider: [name] LABOR, FNP; Next Scheduled Visit: 07/20/2024 Rheumatologist: Defoor, Elsie Conch, PA; Next Scheduled Visit: 06/29/2024 Pulmonologist: Isadora Hose, MD Heart Failure Specialist: Donette Ellouise LABOR, FNP; Next Scheduled Visit: 04/13/2024 Cardiologist: Perla Evalene PARAS, MD Electrophysiologist: Cindie Ole DASEN, MD; Next Scheduled Visit: 05/19/2024 Orthopedic Specialist: [name] Norleen Purchase, MD Nurse Care Manager: Devra Lands, RN; Initial Scheduled Visit: 04/08/2024 Dietitian: Katheleen Aleene ORN, RD; Initial Scheduled Visit: 06/16/2024 Oncologist: Jacobo Evalene PARAS, MD; Next Scheduled Visit: 05/26/2024 Neurologist: Jonette Lauraine Norris, PA; Next Scheduled Visit: 07/20/2024 Endocrinologist: Brendia Calton Squires, PA; Initial Scheduled Visit: 05/06/2024 Gastroenterologist: Previously: Unk Corinn Skiff, MD; Next Scheduled Visit with Therisa Bi, MD: 08/16/2024  Medication Access/Adherence  Current Pharmacy:  [facility] [address]), [place] - [address] [place]) [place] [address] Phone: [phone] Fax: [phone]  [facility] - [place], [place] - [address] [place] [address] Phone: [phone] Fax: [phone]  [facility] [place]  - [place], [place] - [address] [place] [address] Phone: [phone] Fax: [phone]  [facility] [address] [place]  [place] [address] Phone: [phone] Fax: [phone]   Patient reports affordability concerns with their medications: No  Patient reports access/transportation concerns to their pharmacy: No  Patient reports adherence concerns with their medications:  No     Today patient confirms had Abdominal Ultrasound completed at [facility] this morning  Reports noticed small improvement in GI symptoms with restart of omeprazole     Heart Failure/Hypertension/Atrial Fibrillation:   Current medications:  ACEi/ARB/ARNI: Entresto  24-26 mg twice daily SGLT2i: Farxiga  10 mg daily Beta blocker: carvedilol  3.125 mg twice daily Mineralocorticoid Receptor Antagonist: spironolactone  25 mg dialy Diuretic regimen: torsemide  20 mg daily + potassium 20 mEq every other day   Isosorbide  ER 30 mg - 1/2 tablet each morning    Rhythm Control: Currently taking amiodarone  200 mg daily   Anticoagulation Regimen: Xarelto  20 mg daily with supper     Reports home blood pressure reading today ~95/60s   Reports dizziness if takes positional changes quickly    Diabetes:   Current medications:  - Basaglar  36 units daily - Novolog  14 units base + 1 unit correction for every 50 mg/dL above 849 prior to meals - Farxiga  10 mg daily - Mounjaro  5 mg weekly on Tuesdays Plans to increase to Mounjaro  7.5 mg weekly next week    Medications tried in the past: metformin  (GI intolerance); Ozempic  (stopped with switch to Mounjaro )    Patient uses Freestyle Libre 3 Plus Continuous Glucose Monitor   Review patient's last 14-days of data from LibreView:   Date of Download: 04/07/24 % Time CGM is active: 97% Average Glucose: 239 mg/dL Glucose Management Indicator: 9.0%  Glucose Variability: 27.4% (goal <36%) Time in Goal:  - Time in range 70-180: 21% - Time above range: 79% - Time below range: 0%   Patient denies recent symptoms of hypoglycemia    COPD:  Current medications: albuterol  HFA inhaler -  as  needed  Medications tried in the past: Anoro - stopped as did not notice a benefit with use  Reports rarely needing his albuterol  inhaler. Denies shortness of breath or other respiratory symptoms at this time   Objective:  Lab Results  Component Value Date   HGBA1C 8.8 (A) 04/02/2024    Lab Results  Component Value Date   CREATININE 2.16 (H) 04/06/2024   BUN 43 (H) 04/06/2024   NA 137 04/06/2024   K 4.6 04/06/2024   CL 97 04/06/2024   CO2 20 04/06/2024    Lab Results  Component Value Date   CHOL 180 01/01/2024   HDL 29 (L) 01/01/2024   LDLCALC 94 01/01/2024   LDLDIRECT 60.6 11/23/2020   TRIG 341 (H) 01/01/2024   CHOLHDL 6.2 (H) 01/01/2024   BP Readings from Last 3 Encounters:  04/06/24 90/62  04/02/24 97/66  03/17/24 99/66   Pulse Readings from Last 3 Encounters:  04/06/24 88  04/02/24 87  03/17/24 83     Medications Reviewed Today   Medications were not reviewed in this encounter       Assessment/Plan:    Patient to contact [facility] for refill of Rezdiffra    Patient plans to contact [facility] to see if sooner appointment and/or waitlist is available. Note patient previously seen by Dr. Unk   Heart Failure/Hypertension/Atrial Fibrillation: - Encourage patient to take positional changes slowly - Recommend patient to continue to monitor his home blood pressure and daily weight and contact office if needed for readings outside of established parameters or for symptoms   Diabetes: - Reviewed long term cardiovascular and renal outcomes of uncontrolled blood sugar - Have counseled patient on s/s of low blood sugar and how to treat lows - Have reviewed dietary modifications including importance of having regular well-balanced meals and snacks throughout the day, while controlling carbohydrate portion sizes  Patient reports working on following dietary recommendations from Dietitian - Counsel patient on importance of  medication adherence. In particular, encourage patient to ensure adherence to using Novolog  consistently with meals - Plans to increase Mounjaro  dose to 7.5 mg weekly next week.  Note prescription sent to pharmacy by PCP on 8/15 - Recommend to continue to use Freestyle Libre 3 CGM to monitor blood sugar/as feedback on dietary choices Recommend to check glucose with fingerstick check when needed for symptoms and as back up to CGM.  Patient to contact office if needed for readings outside of established parameters or symptoms  COPD: - Request patient monitor his breathing symptoms and contact office if notices any shortness of breath, cough, increase in mucus production, increase in need of albuterol  use or other respiratory symptoms      Follow Up Plan:   Patient prefers to hold of on outreach until October. States will contact clinical pharmacist sooner if needed for medication questions/concerns  Schedule follow up for patient to speak with Clinical Pharmacist Peyton Ferries by telephone on 06/02/2024 at 9:00 AM        Sharyle Sia, PharmD, [facility] [phone]

## 2024-04-08 ENCOUNTER — Other Ambulatory Visit: Payer: Self-pay

## 2024-04-08 ENCOUNTER — Other Ambulatory Visit (HOSPITAL_COMMUNITY): Payer: Self-pay

## 2024-04-08 LAB — COMPREHENSIVE METABOLIC PANEL WITH GFR
ALT: 26 IU/L (ref 0–44)
AST: 26 IU/L (ref 0–40)
Albumin: 4.7 g/dL (ref 3.8–4.9)
Alkaline Phosphatase: 61 IU/L (ref 44–121)
BUN/Creatinine Ratio: 20 (ref 9–20)
BUN: 43 mg/dL — ABNORMAL HIGH (ref 6–24)
Bilirubin Total: 1 mg/dL (ref 0.0–1.2)
CO2: 20 mmol/L (ref 20–29)
Calcium: 10.6 mg/dL — ABNORMAL HIGH (ref 8.7–10.2)
Chloride: 97 mmol/L (ref 96–106)
Creatinine, Ser: 2.16 mg/dL — ABNORMAL HIGH (ref 0.76–1.27)
Globulin, Total: 3.1 g/dL (ref 1.5–4.5)
Glucose: 291 mg/dL — ABNORMAL HIGH (ref 70–99)
Potassium: 4.6 mmol/L (ref 3.5–5.2)
Sodium: 137 mmol/L (ref 134–144)
Total Protein: 7.8 g/dL (ref 6.0–8.5)
eGFR: 35 mL/min/1.73 — ABNORMAL LOW (ref >59)

## 2024-04-08 LAB — T4, FREE: Free T4: 1.75 ng/dL (ref 0.82–1.77)

## 2024-04-08 LAB — TSH: TSH: 1.6 u[IU]/mL (ref 0.450–4.500)

## 2024-04-08 LAB — MAGNESIUM: Magnesium: 2.2 mg/dL (ref 1.6–2.3)

## 2024-04-08 LAB — BRAIN NATRIURETIC PEPTIDE: BNP: 25.8 pg/mL (ref 0.0–100.0)

## 2024-04-08 NOTE — Telephone Encounter (Signed)
 Reviewed results and recommendations with patient. Confirmed he has appt next week with the HF clinic. He verbalized understanding with no further questions.

## 2024-04-08 NOTE — Patient Outreach (Signed)
 Complex Care Management   Visit Note  04/08/2024  Name:  Gabriel [name]. MRN: [id number] DOB: 11-29-1970  Situation: Referral received for Complex Care Management related to Diabetes with Complications I obtained verbal consent from Patient.  Visit completed with Patient  on the phone  Background:   Past Medical History:  Diagnosis Date   Anginal pain (HCC)    Aortic atherosclerosis (HCC)    Aortic root dilation (HCC) 11/28/2016   a.) TTE 11/28/2016: 43 mm; b.) TTE 11/23/2019: 38 mm; c.) TTE 08/14/2022: 40 mm; d.) TTE 03/08/2023: 43 mm; e.) TTE 01/02/2024: 40 mm   Arteriosclerosis of right carotid artery    a.) carotid doppler 04/29/2017: <50% RICA   Atrial fibrillation with RVR (HCC)    a.) CHA2DS2VASc = 6 (HFrEF, HTN, CVA x 2, vascular disease, T2DM) as of 01/27/2024; b.) s/p DCCV 11/06/2023 --> 200 J x 1 -->  NSR; c.) rate/rhythm maintained on oral amiodarone  + carvedilol ; chronically anticoagulated with rivaroxaban    CAD (coronary artery disease)    a.) MV 04/2015: low risk; b.) LHC 01/06/2017: minor irregs in LAD/Diag/LCX/OM, RCA 40p/m/d; c.) R/LHC 06/13/2020: 50% dLAD, 30% p-mLCx, 40% OM1, 100% pRCA (L-R collats) - med mgmt; d.) [facility] 08/19/2023: 50% dLAD, 30% p-mLCx, 40% OM1, 100% pRCA (L-R collats), 50% p-mRCA - med mgmt   Cardiomegaly    Cerebellar infarct (HCC)    a.) brain MRI 06/23/2023: tiny chronic infarct within the RIGHT cerebellar hemisphere; unsure of timing of neurological event; no deficits   Chest wall pain, chronic    Chronic 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    Chronic Troponin Elevation    CKD (chronic kidney disease), stage II-III    COPD (chronic obstructive pulmonary disease) (HCC)    CPAP (continuous positive airway pressure) dependence 03/08/2023   Depression    DM (diabetes mellitus), type 2 (HCC)    Dyspnea    GERD (gastroesophageal reflux disease)    Headache    HFrEF (heart failure with reduced ejection fraction) (HCC)    History of 2019 novel  coronavirus disease (COVID-19) 03/01/2022   History of kidney stones    History of methicillin resistant staphylococcus aureus (MRSA)    Hyperlipidemia    Hypertension    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 12/2015 noted on tele-->amio;  b. 12/2015 Event monitor: no VT noted.   Obesity (BMI 30.0-34.9)    On rivaroxaban  therapy    OSA treated with BiPAP    Pneumonia    Polyp of descending colon    Psoriasis    Psoriatic arthritis (HCC)    a.) Tx'd with ixekizumab   Pulmonary HTN (HCC) 02/29/2016   a.) TTE 02/29/2016: RVSP 47;  b.) TTE 06/26/2019: RVSP 58.4; c.) TTE 12/08/2019: RVSP 40.8; d.) TTE 06/08/2020: RVSP 44.7; e.) R/LHC 06/08/2020: mRA 9, mPA 40, mPWCP 17, AO sat 98, PA sat 61, CO 3.1, CI 1.3, PVR 7.4, LVEDP 20; f.) R/LHC 08/18/2020: mRA 5, mPA 41, mPCWP 18, AO sat 92, PA sat 59, CO 4.3, CI 1.9, PVR 5.3   Recurrent pulmonary emboli (HCC) 06/07/2020   06/07/20: small bilateral PEs.  12/31/19: RUL and RLL PEs.   Secondary polycythemia    a.) negative for JAK2 with reflex and BCR-ABL mutations; seeing hematology with etiology felt to be secondary to underlying COPD (quit smoking 04/2020), heart  disease, OSAH, and obesity   Stroke [facility])    Syncope    a. 01/2016 - felt to be vasovagal.   Thrombocytopenia (HCC)     Assessment: Patient Reported Symptoms:  Cognitive Cognitive Status: No symptoms reported Cognitive/Intellectual Conditions Management [RPT]: None reported or documented in medical history or problem list   Health Maintenance Behaviors: Annual physical exam, Healthy diet Healing Pattern: Average Health Facilitated by: Healthy diet  Neurological Neurological Review of Symptoms: Headaches Neurological Comment: reports dizziness, not vertigo, Zio monitor results pending, headaches bad at times, not necessarily asssociated with BP or BG  HEENT   HEENT Comment: throat improved, some vision issues - thinks associated with elevated BG events    Cardiovascular Cardiovascular Symptoms Reported: Dizziness Cardiovascular Comment: Zio monitor results pending, no HF symptoms and awae of CHF action plan  Respiratory Respiratory Symptoms Reported: No symptoms reported Respiratory Management Strategies: Routine screening  Endocrine Other symptoms related to hypoglycemia or hyperglycemia: BG remains elevated, vision issues when elevated, saw Nutrition and RPH, awaiting US  abdomen results and plans to start Monjouro in 2 [place]aro Koral Is patient diabetic?: Yes Is patient checking blood sugars at home?: Yes List most recent blood sugar readings, include date and time of day: FBG 281 today ranges usually upper 200 for FBG - given education regarding Hypergycemia and when to call provider Endocrine Self-Management Outcome: 2 (bad)  Gastrointestinal Gastrointestinal Symptoms Reported: Abdominal pain or discomfort, Diarrhea Additional Gastrointestinal Details: diarrhea, bloating, US  abdomen results pending Gastrointestinal Management Strategies: Medication therapy    Genitourinary Additional Genitourinary Details: reports told to hydrate more, BNP normal, torsemide  reduced to 40 mg  daily Genitourinary Management Strategies: Medication therapy, Fluid modification, Diet modification  Integumentary Integumentary Symptoms Reported: No symptoms reported Additional Integumentary Details: scalp site less painful Skin Management Strategies: Routine screening  Musculoskeletal Musculoskelatal Symptoms Reviewed: Back pain, Joint pain, Muscle pain Musculoskeletal Management Strategies: Routine screening Falls in the past year?: No Number of falls in past year: 1 or less Was there an injury with Fall?: No Fall Risk Category Calculator: 0 Patient Fall Risk Level: Low Fall Risk Patient at Risk for Falls Due to: History of fall(s) Fall risk Follow up: Falls evaluation completed, Falls prevention discussed (Anticoagulent - ED for head bumps)  Psychosocial Psychosocial Symptoms Reported: Irritability Additional Psychological Details: frustration, have not been able to determine what is wrong with him     Quality of Family Relationships: supportive Do you feel physically threatened by others?: No    04/08/2024    PHQ2-9 Depression Screening   Little interest or pleasure in doing things    Feeling down, depressed, or hopeless    PHQ-2 - Total Score    Trouble falling or staying asleep, or sleeping too much    Feeling tired or having little energy    Poor appetite or overeating     Feeling  bad about yourself - or that you are a failure or have let yourself or your family down    Trouble concentrating on things, such as reading the newspaper or watching television    Moving or speaking so slowly that other people could have noticed.  Or the opposite - being so fidgety or restless that you have been moving around a lot more than usual    Thoughts that you would be better off dead, or hurting yourself in some way    PHQ2-9 Total Score    If you checked off any problems, how difficult have these problems made it for you to do your work, take care of things at home, or get along with  other people    Depression Interventions/Treatment      Vitals:   04/08/24 1352  BP: 130/70    Medications Reviewed Today     Reviewed by Devra Lands, RN (Registered Nurse) on 04/08/24 at 1347  Med List Status: <None>   Medication Order Taking? Sig Documenting Provider Last Dose Status Informant  Accu-Chek Softclix Lancets lancets [id number] Yes USE UP TO FOUR TIMES DAILY [provider]  Active Self, Pharmacy Records  albuterol  (VENTOLIN  HFA) 108 (90 Base) MCG/ACT inhaler [id number] Yes Inhale 2 puffs into the lungs every 4 (four) hours as needed for wheezing or shortness of breath. Isadora Hose, MD  Active Self, Pharmacy Records           Med Note [facility] [place] A   Fri Feb 06, 2024  2:26 PM) PRN  allopurinol  (ZYLOPRIM ) 300 MG tablet [id number] Yes Take 300 mg by mouth every morning. [provider]  Active Self, Pharmacy Records  amiodarone  (PACERONE ) 200 MG tablet [id number] Yes Take 1 tablet (200 mg total) by mouth daily. Cindie Ole DASEN, MD  Active Self, Pharmacy Records  aspirin  EC 81 MG tablet [id number] Yes Take 81 mg by mouth every morning. [provider]  Active Self, Pharmacy Records  BD PEN NEEDLE NANO 2ND GEN 32G X 4 MM MISC [id number] Yes USE  AS DIRECTED AT BEDTIME Emilio Kelly DASEN, FNP  Active Self, Pharmacy Records  blood glucose meter kit and supplies KIT [id number] Yes Dispense based on patient and insurance preference. Use up to four times daily as directed. Emilio Kelly DASEN, FNP  Active Self, Pharmacy Records  butalbital -acetaminophen -caffeine  (FIORICET ) 50-325-40 MG tablet [id number] Yes Take 1 tablet by mouth every 4 (four) hours as needed for migraine. [provider]  Active Self, Pharmacy Records  cetirizine  (ZYRTEC ) 10 MG tablet [id number] Yes Take 1 tablet (10 mg total) by mouth daily as needed for allergies. Wellington Beams A, FNP  Active   ciclopirox  (PENLAC ) 8 % solution [id number] Yes Apply topically at bedtime. Apply over  nail and surrounding skin. Apply daily over previous coat. After seven (7) days, may remove with alcohol and continue cycle. Wellington Beams LABOR, FNP  Active   Continuous Glucose Sensor (FREESTYLE LIBRE 3 PLUS SENSOR) [place] [id number] Yes Place 1 sensor on the skin every 15 days. Use to check glucose continuously Wellington Beams A, FNP  Active   dapagliflozin  propanediol (FARXIGA ) 10 MG TABS tablet [id number] Yes Take 1 tablet (10 mg total) by mouth daily. Donette Ellouise LABOR, FNP  Active Self, Pharmacy Records  Evolocumab  (REPATHA  SURECLICK) 140 MG/ML EMMANUEL [id number] Yes Inject 140 mg into the skin every 14 (fourteen) days. Gollan, Timothy J, MD  Active   ezetimibe  (ZETIA ) 10 MG tablet [id number] Yes Take 1 tablet (10 mg total)  by mouth daily. Gollan, Timothy J, MD  Active   fenofibrate  (TRICOR ) 145 MG tablet [id number] Yes Take 1 tablet (145 mg total) by mouth every evening. Gollan, Timothy J, MD  Active   fluticasone  (FLONASE ) 50 MCG/ACT nasal spray [id number] Yes Place 2 sprays into both nostrils daily as needed for allergies or rhinitis. Home med. Poggi, Norleen PARAS, MD  Active Self, Pharmacy Records           Med Note [facility] [place] A   Fri Feb 06, 2024  2:26 PM) prn  insulin  aspart (NOVOLOG  FLEXPEN) 100 UNIT/ML FlexPen [id number] Yes Inject 14 Units into the skin 3 (three) times daily with meals. INJECT 14 UNITS + CORRECTION BEFORE BREAKFAST/LUNCH AND SUPPER (CORRECTION = 1 UNIT PER 50 MG/DL ABOVE 849); MAX DAILY DOSE 57 UNITS Wellington Beams A, FNP  Active   Insulin  Glargine (BASAGLAR  KWIKPEN) 100 UNIT/ML [id number] Yes Inject 36 Units into the skin at bedtime. Emilio Kelly DASEN, FNP  Active Self, Pharmacy Records  Insulin  Pen Needle 32G X 4 MM MISC [id number] Yes 1 each by Does not apply route in the morning, at noon, in the evening, and at bedtime. Emilio Kelly DASEN, FNP  Active Self, Pharmacy Records  ipratropium-albuterol  (DUONEB) 0.5-2.5 (3) MG/3ML SOLN [id number] Yes Take 3 mLs by nebulization every 6 (six) hours  as needed. Wellington Beams LABOR, FNP  Active Self, Pharmacy Records  meclizine  (ANTIVERT ) 25 MG tablet [id number]  Take 0.5 tablets (12.5 mg total) by mouth 3 (three) times daily as needed for dizziness.  Patient not taking: Reported on 04/08/2024   [name] Norleen PARAS, MD  Active Self, Pharmacy Records           Med Note [facility] [place] [facility]   Fri Feb 06, 2024  2:27 PM) prn  nitroGLYCERIN  (NITROSTAT ) 0.4 MG SL tablet [id number] Yes Place 1 tablet (0.4 mg total) under the tongue every 5 (five) minutes x 3 doses as needed for chest pain. Gollan, Timothy J, MD  Active   omeprazole  (PRILOSEC) 40 MG capsule [id number] Yes Take 1 capsule (40 mg total) by mouth daily. Wellington Beams LABOR, FNP  Active   potassium chloride  SA (KLOR-CON  M) 20 MEQ tablet [id number] Yes Take 1 tablet (20 mEq total) by mouth every other day. AM Gollan, Timothy J, MD  Active   Resmetirom  (REZDIFFRA ) 100 MG TABS [id number]  TAKE 1 TABLET BY MOUTH DAILY WITH OR WITHOUT FOOD  Patient not taking: Reported on 04/08/2024   Unk Corinn Skiff, MD  Active Self, Pharmacy Records  rivaroxaban  (XARELTO ) 20 MG TABS tablet [id number] Yes Take 1 tablet (20 mg total) by mouth daily with supper. Bensimhon, Toribio SAUNDERS, MD  Active Self, Pharmacy Records  rosuvastatin  (CRESTOR ) 20 MG tablet [id number] Yes Take 1 tablet (20 mg total) by mouth daily. Wellington Beams LABOR, FNP  Active   sacubitril -valsartan  (ENTRESTO ) 24-26 MG [id number] Yes Take 1 tablet by mouth 2 (two) times daily. [place] A, FNP  Active   spironolactone  (ALDACTONE ) 25 MG tablet [id number] Yes Take 25 mg by mouth daily. [provider]  Active   TALTZ 80 MG/ML SOAJ [id number] Yes Inject 3 mLs into the skin every 28 (twenty-eight) days. [provider]  Active Self, Pharmacy Records           Med Note CHRISTIE ALEXANDER   Fri Aug 08, 2023  3:20 PM)    tirzepatide  (MOUNJARO ) 7.5 MG/0.5ML Pen [id number]  Inject 7.5 mg into the skin once a week.  Patient not taking:  Reported on  04/08/2024   [name] LABOR, FNP  Active   Torsemide  40 MG TABS [id number] Yes Take 40 mg by mouth daily. Gollan, Timothy J, MD  Active             Recommendation:   PCP Follow-up Specialty provider follow-up Endocrinologist 05/06/2024 Continue Current Plan of Care Call for refill of Rezdiffra    Follow Up Plan:   Telephone follow-up in 1 month Please call to schedule  Nestora Duos, MSN, RN [facility]  [facility] RN Care Manager Direct Dial : [phone] Fax: [phone]

## 2024-04-08 NOTE — Patient Instructions (Signed)
 Visit Information  Mr. Brar was given information about Medicaid Managed Care team care coordination services as a part of their [employer] Medicaid benefit.   If you would like to schedule transportation through your [facility] plan, please call the following number at least 2 days in advance of your appointment: [phone]  If you are experiencing a behavioral health crisis, call the [employer] [place]  Behavioral Health Crisis Line at [phone]). The line is available 24 hours a day, seven days a week.   Mr. Fauth - following are the goals we discussed in your visit today:    Goals Addressed             This Visit's Progress    VBCI RN Care Plan - DM   Worsening    Problems:  Chronic Disease Management support and education needs related to DMII  Goal: Over the next 90 days the Patient will attend all scheduled medical appointments: PCP and specialist as evidenced by no missed appointments        continue to work with RN Care Manager and/or Social Worker to address care management and care coordination needs related to DMII as evidenced by adherence to care management team scheduled appointments     take all medications exactly as prescribed and will call provider for medication related questions as evidenced by patient reporting compliance with all medications    verbalize understanding of plan for management of DMII as evidenced by taking medications as prescribed, reporting lack of improvement in BG after medication changes as requested by provider, continue diabetic diet/exercise, follow DM action plan sent regarding signs and symptoms to report to provider and [place] for ED Engage with Pharmacist for Disease State Management - DM  Interventions:   Diabetes Interventions: Assessed patient's understanding of A1c goal: <7% Currently 8.8 and trending up with uncontrolled BG - unknown cause. Endo NP  appointment 05/06/24 Reviewed medications with patient and discussed importance of medication adherence - reports compliance with all meds Provided patient with written educational materials related to hypo and hyperglycemia and importance of correct treatment Referral to Pharmacist for Disease State Management - DM - visit completed with [facility] and nutrition Screening for signs and symptoms of depression related to chronic disease state  Assessed social determinant of health barriers Lab Results  Component Value Date   HGBA1C 8.8 (A) 04/02/2024    Patient Self-Care Activities:  Attend all scheduled provider appointments Call pharmacy for medication refills 3-7 days in advance of running out of medications Call provider office for new concerns or questions  Take medications as prescribed   schedule appointment with eye doctor check blood sugar at prescribed times: before meals and at bedtime and as directed by provider check feet daily for cuts, sores or redness enter blood sugar readings and medication or insulin  into daily log take the blood sugar log to all doctor visits keep feet up while sitting wash and dry feet carefully every day wear comfortable, cotton socks wear comfortable, well-fitting shoes Discuss endocrinology referral with PCP - new patient appointment 05/06/24 Complete visit with Pharmacist re DM - completed with [facility] and nutrition  Plan:  Telephone follow up appointment with care management team member scheduled for:  Please call to schedule             Please see education materials related to Hyperglycemia provided by MyChart link.  Patient verbalizes understanding of instructions and care plan provided today and agrees to view in  MyChart. Active MyChart status and patient understanding of how to access instructions and care plan via MyChart confirmed with patient.     Telephone follow up appointment with Managed Medicaid care management team member scheduled  for: Please call to schedule, if I do not hear from you I will call next week.   Nestora Duos, MSN, RN [place]  [facility], [facility] RN Care Manager Direct Dial : [phone] Fax: [phone]   Hyperglycemia Hyperglycemia is when the amount of sugar, or glucose, in your blood is too high. High blood sugar can happen if you have diabetes or if you don't have diabetes. It may be an emergency. What are the causes? If you have diabetes, high blood sugar may be caused by: Medicines that increase blood sugar. Not giving yourself enough insulin  (if you take it). Being less active than normal. Eating more than planned. Illness, an injury, or an infection. Having surgery. Stress. If you don't have diabetes, high blood sugar may be caused by: Certain medicines, such as steroids or thiazide diuretics. Stress. A bad illness or infection. Having surgery. Diseases of the pancreas. What increases the risk? You're more likely to have high blood sugar if: Someone in your family has diabetes. You're overweight. You aren't active. You have or have had: Prediabetes. Diabetes when pregnant. Polycystic ovarian syndrome (PCOS). What are the signs or symptoms? High blood sugar may not cause symptoms. If you do have symptoms, they may include: Feeling more thirsty than normal. Needing to pee more often than normal. Hunger. Feeling very tired. Blurry eyesight. You may have other symptoms if your high blood sugar isn't treated. These may include: Pain in your belly. A headache. Weakness. Weight loss that's not planned. A tingling or numb feeling in your hands or feet. Cuts or bruises that heal slowly. How is this diagnosed?  High blood sugar is diagnosed with a blood test. This test tells you how much sugar is in your blood. It's done while you're having symptoms.  Your health care provider may also do a physical exam, look at your medical history, and do  more blood tests. These tests may include: A fasting blood glucose (FBG) test. You can't eat for at least 8 hours before this test. An A1C test. A glucose tolerance test. How is this treated? Treatment may include: Taking medicine to control your blood sugar levels. Changing your medicine or how much you take if you take insulin  or other diabetes medicines. Checking your blood sugar more often. Making changes to your daily life. These may include: Being more active. Eating healthier foods. Losing weight. Treating an illness or infection. Stopping or taking less steroids. If your high blood sugar stays high, you may need to be treated in the hospital. Follow these instructions at home: If you have diabetes:  Know the symptoms of high blood sugar. Follow your diabetes care plan. Make sure you: Take insulin  and medicines as told. Check your blood sugar as often as told. Eat on time. Do not skip meals. Check your blood sugar before and after you exercise. If you exercise longer or harder than normal, check your blood sugar more often. Follow your sick day plan when you can't eat or drink like normal. Make this plan ahead of time with your provider. Share your diabetes care plan with: Your work or school. The people you live with. Wear an alert bracelet or carry a card that says you have diabetes. General instructions Take medicines only as told by  your provider. Drink enough fluid to keep your pee (urine) pale yellow. Make sure you drink enough when you: Exercise. Get sick. Are in hot places. If you drink alcohol: Limit how much you have to: 0-1 drink a day if you're male. 0-2 drinks a day if you're male. Know how much alcohol is in your drink. In the [place] one drink is one 12 oz bottle of beer (355 mL), one 5 oz glass of wine (148 mL), or one 1 oz glass of hard liquor (44 mL). Manage stress. If you need help with this, ask your provider. Exercise as told. Try to stay at a  healthy weight. Keep all follow-up visits. Your provider will want to make sure your high blood sugar is treated. Where to find more information American Diabetes Association (ADA): diabetes.org Contact a health care provider if: You have diabetes and have trouble keeping your blood sugar in the right range. Your blood sugar is at or above 240 mg/dL (86.6 mmol/L) for 2 days in a row. You have high blood sugar often. You have signs of illness, such as: Nausea or vomiting. A headache. A fever. You can't stop vomiting. Get help right away if: Your blood sugar monitor reads high even when you're taking insulin . You have trouble breathing. These symptoms may be an emergency. Call 911 right away. Do not wait to see if the symptoms will go away. Do not drive yourself to the hospital. This information is not intended to replace advice given to you by your health care provider. Make sure you discuss any questions you have with your health care provider. Document Revised: 05/08/2023 Document Reviewed: 10/23/2022 Elsevier Patient Education  2024 [employer].

## 2024-04-09 DIAGNOSIS — I5022 Chronic systolic (congestive) heart failure: Secondary | ICD-10-CM | POA: Diagnosis not present

## 2024-04-09 DIAGNOSIS — I4819 Other persistent atrial fibrillation: Secondary | ICD-10-CM | POA: Diagnosis not present

## 2024-04-09 NOTE — Patient Instructions (Signed)
 Goals Addressed             This Visit's Progress    Pharmacy Goals       Our goal A1c is less than 7%. This corresponds with fasting sugars less than 130 and 2 hour after meal sugars less than 180. Please continue to use Freestyle Libre 3 Continuous Glucose monitor for blood sugar monitoring.  Check your blood pressure once daily, and any time you have concerning symptoms like headache, chest pain, dizziness, shortness of breath, or vision changes.    To appropriately check your blood pressure, make sure you do the following:  1) Avoid caffeine, exercise, or tobacco products for 30 minutes before checking. Empty your bladder. 2) Sit with your back supported in a flat-backed chair. Rest your arm on something flat (arm of the chair, table, etc). 3) Sit still with your feet flat on the floor, resting, for at least 5 minutes.  4) Check your blood pressure. Take 1-2 readings.  5) Write down these readings and bring with you to any provider appointments.  Bring your home blood pressure machine with you to a provider's office for accuracy comparison at least once a year.   Make sure you take your blood pressure medications before you come to any office visit, even if you were asked to fast for labs.   Estelle Grumbles, PharmD, [facility] [phone]

## 2024-04-12 ENCOUNTER — Telehealth: Payer: Self-pay | Admitting: Family

## 2024-04-12 NOTE — Telephone Encounter (Signed)
 Called to confirm/remind patient of [facility].   Appointment:   [x] Confirmed  [] Left mess   [] No answer/No voice mail  [] VM Full/unable to leave message  [] Phone not in service  Patient reminded to bring all medications and/or complete list.  Confirmed patient has transportation. Gave directions, instructed to utilize valet parking.

## 2024-04-12 NOTE — Progress Notes (Unsigned)
 Advanced [facility] Failure Clinic Note    PCP: Ostwalt, Janna, PA  Primary Cardiologist: Gollan, Timothy, MD  HF cardiologist: Cherrie Sieving, MD   Chief Complaint: shortness of breath   HPI:  Mr Pherigo is a 53 y/o male with a history of atrial fibrillation (03/25), HTN, T2DM, chronic chest pain, chronic troponin elevation, COPD, CKD, asthma, NSVT, OSA, unprovoked PE, pulmonary HTN, psoriasis, migraines, cirrhosis from NASH, ICD placement (10/23), previous tobacco use and chronic heart failure. Had left shoulder arthroscopy with debridement, decompression, rotator cuff repair and biceps tenodesis done 09/16/23.   RHC/LHC done 06/13/20 and showed: Severe single-vessel coronary artery disease with chronic total occlusion of the proximal RCA.  The distal vessel fills via left to right collaterals. Mild to moderate, nonobstructive CAD involving the LAD and LCx with up to 50% stenosis. Mildly elevated left and right heart filling pressures. Moderate pulmonary hypertension (mean PAP 40 mmHg) with significantly elevated pulmonary vascular resistance (PVR 7.4 WU). Severely reduced Fick cardiac output/index.  Echo 05/21/21: EF of 40-45% along with mild LVH and mild MR.   Admitted 09/2021 & 11/2021 with HF.   Echo 11/24/21: EF of 20-25% along with mild LVH and mild MR.  Echo 02/06/22: EF of 30-35% along with mild/moderate AR.   Admitted 05/20/22 for ICD placement and discharged the next day. Was in the ED 12/21/22 due to dizziness & chest pain.   Echo 08/14/22: EF 40-45% with mild LVH, Grade I DD, severe hypokinesis of left ventricular, entire inferior and inferolateral wall.  Echo 03/08/23: EF 45-50% with moderate LVH, trivial MR, mild dilatation of aortic root at 43 mm  Was in the ED 06/09/23 due to SOB/ chest pain for previous 2 days. CXR concerning for pneumonia so antibiotics provided. Initial troponin in the 50's which is close to his baseline. EKG without ischemic changes.   RHC/ LHC  08/19/23:  Relatively stable appearance of the coronary arteries since last catheterization in 2021.  Chronically occluded proximal RCA now has antegrade flow, though I suspect this represents bridging collaterals.  Left-to-right collateral remain as well as moderate LAD and LCx disease. Mildly elevated left heart filling pressures (LVEDP 20 mmHg, PCWP 18 mmHg). Moderate pulmonary hypertension (mean PA 41 mmHg, PVR 5.3 WU). Moderately reduced Fick cardiac output/index (CO 4.3 L/min, CI 1.9 L/min/m^2).  Admitted 10/22/23 due to COPD exacerbation secondary to influenza.   Admitted 11/03/23 with dizziness, tachycardia and new onset AF. Echo 3/18 -- EF 35-40%, global LV hypokinesis, severely dilated LV internal cavity size, mild LVH, small pericardial effusion, mild MR. IV diuresed, GDMT restarted. Successful cardioversion 11/06/23. Amiodarone  loaded.   Was seen in the HF clinic 12/05/23 where torsemide  was increased to 40mg  daily X 3 days and then decreased back to 20mg  daily.   Was seen in the HF clinic 12/10/23 & was sent for IV lasix  due to being fluid up. Zio monitor placed due to increased palpitations.   Echo 01/02/24: EF 35-40%, mild LVH, low normal RV, mild LAE, mild/ moderate AR, mild dilatation of the aortic root, measuring 40 mm   Admitted 02/06/24 with SOB and hypoxia for several days prior to admission. CXR showed edema, no effusions. ECG NSR. BNP 600. IV diuresed with transition to oral diuretics.    He presents today for a HF follow-up visit with a chief complaint of shortness of breath. Has associated fatigue, stable chest pain, occasional palpitations, dizziness with sensation of room spinning, sleeping well on 2 pillows. Had been having headaches off of carvedilol  so  he resumed taking 6.25mg  at bedtime with resolution of headaches. Unable to afford Repatha  at the moment as his insurance won't cover it. Is asking for furoscix  to have on hand if he needs it.   Currently undergoing GI workup  for abdominal bloating/ fullness. Recently had abdominal ultrasound and results are pending.   Glucose improving in the 140's /150's since he adjusted his insulin . Prior to that, he had been getting readings in the 400-500's.    ROS: All systems negative except as listed in HPI, PMH and Problem List.  SH:  Social History   Socioeconomic History   Marital status: Widowed    Spouse name: Not on file   Number of children: 1   Years of education: 32   Highest education level: 12th grade  Occupational History   Occupation: disabled  Tobacco Use   Smoking status: Former    Current packs/day: 0.00    Average packs/day: 0.5 packs/day for 33.0 years (16.5 ttl pk-yrs)    Types: Cigarettes    Start date: 05/09/1987    Quit date: 05/08/2020    Years since quitting: 3.9   Smokeless tobacco: Former    Quit date: 05/08/2020   Tobacco comments:    Quit Sept 2021  Vaping Use   Vaping status: Former  Substance and Sexual Activity   Alcohol use: Not Currently   Drug use: No   Sexual activity: Not Currently  Other Topics Concern   Not on file  Social History Narrative   Lives at home with wife and his dad's wife.        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    Social Drivers of Corporate investment banker Strain: High Risk (01/20/2024)   Received from [facility]   Overall Financial Resource Strain (CARDIA)    Difficulty of Paying Living Expenses: Hard  Food Insecurity: No Food Insecurity (04/08/2024)   Hunger Vital Sign    Worried About Running Out of Food in the Last Year: Never true    Ran Out of Food in the Last Year: Never true  Recent Concern: Food Insecurity - Food Insecurity Present (01/20/2024)   Received from [facility]   Hunger Vital Sign    Within the past 12 months, you worried that your food would run out before you got the money to buy more.: Sometimes true    Within the  past 12 months, the food you bought just didn't last and you didn't have money to get more.: Sometimes true  Transportation Needs: No Transportation Needs (04/08/2024)   PRAPARE - Administrator, Civil Service (Medical): No    Lack of Transportation (Non-Medical): No  Physical Activity: Insufficiently Active (01/20/2018)   Exercise Vital Sign    Days of Exercise per Week: 7 days    Minutes of Exercise per Session: 10 min  Stress: Stress Concern Present (09/02/2017)   [name] of Occupational Health - Occupational Stress Questionnaire    Feeling of Stress : Very much  Social Connections: Unknown (10/22/2023)   Social Connection and Isolation Panel    Frequency of Communication with Friends and Family: Not on file    Frequency of Social Gatherings with Friends and Family: Patient declined    Attends Religious Services: Patient declined    Active Member of Clubs or Organizations: Patient declined    Attends Banker Meetings: Patient declined  Marital Status: Patient declined  Intimate Partner Violence: Not At Risk (04/08/2024)   Humiliation, Afraid, Rape, and Kick questionnaire    Fear of Current or Ex-Partner: No    Emotionally Abused: No    Physically Abused: No    Sexually Abused: No    FH:  Family History  Problem Relation Age of Onset   Diabetes Mother    Diabetes Mellitus II Mother    Hypothyroidism Mother    Hypertension Mother    Kidney failure Mother        Dialysis   Heart attack Mother        25 yo approximately   Hypertension Father    Gout Father    Cancer Maternal Grandfather    Diabetes Maternal Grandfather    Cancer Paternal Aunt     Past Medical History:  Diagnosis Date   Anginal pain (HCC)    Aortic atherosclerosis (HCC)    Aortic root dilation (HCC) 11/28/2016   a.) TTE 11/28/2016: 43 mm; b.) TTE 11/23/2019: 38 mm; c.) TTE 08/14/2022: 40 mm; d.) TTE 03/08/2023: 43 mm; e.) TTE 01/02/2024: 40 mm   Arteriosclerosis of right  carotid artery    a.) carotid doppler 04/29/2017: <50% RICA   Atrial fibrillation with RVR (HCC)    a.) CHA2DS2VASc = 6 (HFrEF, HTN, CVA x 2, vascular disease, T2DM) as of 01/27/2024; b.) s/p DCCV 11/06/2023 --> 200 J x 1 -->  NSR; c.) rate/rhythm maintained on oral amiodarone  + carvedilol ; chronically anticoagulated with rivaroxaban    CAD (coronary artery disease)    a.) MV 04/2015: low risk; b.) LHC 01/06/2017: minor irregs in LAD/Diag/LCX/OM, RCA 40p/m/d; c.) R/LHC 06/13/2020: 50% dLAD, 30% p-mLCx, 40% OM1, 100% pRCA (L-R collats) - med mgmt; d.) R/LHC 08/19/2023: 50% dLAD, 30% p-mLCx, 40% OM1, 100% pRCA (L-R collats), 50% p-mRCA - med mgmt   Cardiomegaly    Cerebellar infarct (HCC)    a.) brain MRI 06/23/2023: tiny chronic infarct within the RIGHT cerebellar hemisphere; unsure of timing of neurological event; no deficits   Chest wall pain, chronic    Chronic 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    Chronic Troponin Elevation    CKD (chronic kidney disease), stage II-III    COPD (chronic obstructive pulmonary disease) (HCC)    CPAP (continuous positive airway pressure) dependence 03/08/2023   Depression    DM (diabetes mellitus), type 2 (HCC)    Dyspnea    GERD (gastroesophageal reflux disease)    Headache    HFrEF (heart failure with reduced ejection fraction) (HCC)    History of 2019 novel coronavirus disease (COVID-19) 03/01/2022   History of kidney stones    History of methicillin resistant staphylococcus aureus (MRSA)    Hyperlipidemia    Hypertension    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 12/2015 noted on tele-->amio;  b. 12/2015 Event monitor: no VT noted.   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  12/31/19: RUL and RLL PEs.   Secondary polycythemia    a.) negative for JAK2 with reflex and BCR-ABL mutations; seeing hematology with etiology felt to be secondary to underlying COPD (quit smoking 04/2020), heart disease, OSAH, and obesity   Stroke [facility])    Syncope    a. 01/2016 - felt to be vasovagal.   Thrombocytopenia (HCC)     Current Outpatient Medications  Medication Sig Dispense Refill   Accu-Chek Softclix Lancets lancets USE UP TO FOUR TIMES DAILY     albuterol  (VENTOLIN  HFA) 108 (90 Base) MCG/ACT inhaler Inhale 2 puffs into the lungs every 4 (four) hours as needed for wheezing or shortness of breath. 8 g 6   allopurinol  (ZYLOPRIM ) 300 MG tablet Take 300 mg by mouth every morning.     amiodarone  (PACERONE ) 200 MG tablet Take 1 tablet (200 mg total) by mouth daily. 90 tablet 3   aspirin  EC 81 MG  tablet Take 81 mg by mouth every morning.     BD PEN NEEDLE NANO 2ND GEN 32G X 4 MM MISC USE  AS DIRECTED AT BEDTIME 200 each 0   blood glucose meter kit and supplies KIT Dispense based on patient and insurance preference. Use up to four times daily as directed. 1 each 11   butalbital -acetaminophen -caffeine  (FIORICET ) 50-325-40 MG tablet Take 1 tablet by mouth every 4 (four) hours as needed for migraine.     cetirizine  (ZYRTEC ) 10 MG tablet Take 1 tablet (10 mg total) by mouth daily as needed for allergies.     ciclopirox  (PENLAC ) 8 % solution Apply topically at bedtime. Apply over nail and surrounding skin. Apply daily over previous coat. After seven (7) days, may remove with alcohol and continue cycle. 6.6 mL 0   Continuous Glucose Sensor (FREESTYLE LIBRE 3 PLUS SENSOR) MISC Place 1 sensor on the skin every 15 days. Use to check glucose continuously 2 each 12   dapagliflozin  propanediol (FARXIGA ) 10 MG TABS tablet Take 1 tablet (10 mg total) by mouth daily. 90 tablet 3   Evolocumab  (REPATHA  SURECLICK) 140 MG/ML SOAJ Inject 140 mg into the skin every 14 (fourteen) days. 6 mL 3   ezetimibe  (ZETIA ) 10 MG tablet Take 1 tablet (10 mg total) by mouth daily. 90 tablet 3   fenofibrate  (TRICOR ) 145 MG tablet Take 1 tablet (145 mg total) by mouth every evening. 90 tablet 3   fluticasone  (FLONASE ) 50 MCG/ACT nasal spray Place 2 sprays into both nostrils daily as needed for allergies or rhinitis. Home med.     insulin   aspart (NOVOLOG  FLEXPEN) 100 UNIT/ML FlexPen Inject 14 Units into the skin 3 (three) times daily with meals. INJECT 14 UNITS + CORRECTION BEFORE BREAKFAST/LUNCH AND SUPPER (CORRECTION = 1 UNIT PER 50 MG/DL ABOVE 849); MAX DAILY DOSE 57 UNITS 15 mL 3   Insulin  Glargine (BASAGLAR  KWIKPEN) 100 UNIT/ML Inject 36 Units into the skin at bedtime. 45 mL 3   Insulin  Pen Needle 32G X 4 MM MISC 1 each by Does not apply route in the morning, at noon, in the evening, and at bedtime. 120 each 11    ipratropium-albuterol  (DUONEB) 0.5-2.5 (3) MG/3ML SOLN Take 3 mLs by nebulization every 6 (six) hours as needed. 360 mL 11   meclizine  (ANTIVERT ) 25 MG tablet Take 0.5 tablets (12.5 mg total) by mouth 3 (three) times daily as needed for dizziness. (Patient not taking: Reported on 04/08/2024)     nitroGLYCERIN  (NITROSTAT ) 0.4 MG SL tablet Place 1 tablet (0.4 mg total) under the tongue every 5 (five) minutes x 3 doses as needed for chest pain. 30 tablet 3   omeprazole  (PRILOSEC) 40 MG capsule Take 1 capsule (40 mg total) by mouth daily. 30 capsule 3   potassium chloride  SA (KLOR-CON  M) 20 MEQ tablet Take 1 tablet (20 mEq total) by mouth every other day. AM 45 tablet 3   Resmetirom  (REZDIFFRA ) 100 MG TABS TAKE 1 TABLET BY MOUTH DAILY WITH OR WITHOUT FOOD (Patient not taking: Reported on 04/08/2024) 30 tablet 1   rivaroxaban  (XARELTO ) 20 MG TABS tablet Take 1 tablet (20 mg total) by mouth daily with supper. 90 tablet 3   rosuvastatin  (CRESTOR ) 20 MG tablet Take 1 tablet (20 mg total) by mouth daily. 90 tablet 3   sacubitril -valsartan  (ENTRESTO ) 24-26 MG Take 1 tablet by mouth 2 (two) times daily. 180 tablet 1   spironolactone  (ALDACTONE ) 25 MG tablet Take 25 mg by mouth daily.     TALTZ 80 MG/ML SOAJ Inject 3 mLs into the skin every 28 (twenty-eight) days.     [START ON 04/13/2024] tirzepatide  (MOUNJARO ) 7.5 MG/0.5ML Pen Inject 7.5 mg into the skin once a week. (Patient not taking: Reported on 04/08/2024) 6 mL 1   Torsemide  40 MG TABS Take 40 mg by mouth daily. 90 tablet 3   No current facility-administered medications for this visit.   Vitals:   04/13/24 0821  BP: 126/76  Pulse: 82  SpO2: 96%  Weight: 252 lb 8 oz (114.5 kg)   Wt Readings from Last 3 Encounters:  04/13/24 252 lb 8 oz (114.5 kg)  04/06/24 249 lb 6.4 oz (113.1 kg)  04/02/24 251 lb 6.4 oz (114 kg)   Lab Results  Component Value Date   CREATININE 2.16 (H) 04/06/2024   CREATININE 1.50 (H) 02/24/2024   CREATININE 1.17 02/07/2024     PHYSICAL EXAM:  General: Well appearing.  Cor: No JVD. Regular rhythm, rate.  Lungs: clear Abdomen: soft, nontender, nondistended. Extremities: no edema Neuro:. Affect pleasant  ECG: not done   ASSESSMENT & PLAN:  1.  Chronic HFrEF/Mixed Ischemic and nonischemic cardiomyopathy:   - Mixed ICM/NICM - NYHA class III  - euvolemic - s/p [employer]; denies any shocks - gradually gaining weight but he doesn't think it's fluid related. BNP last week was normal.  - Echo 05/21/21: EF of 40-45% along with mild LVH and mild MR.  - Echo 11/24/21: EF of 20-25% along with mild LVH and mild MR.  - Echo 02/06/22: EF of 30-35% along with mild/moderate AR.  -  Echo 08/14/22: EF 40-45% with mild LVH, Grade I DD, severe hypokinesis of left ventricular, entire inferior and inferolateral wall.  - Echo 03/08/23: EF 45-50% with moderate LVH, trivial MR, mild dilatation of aortic root at 43 mm - Echo 10/1823: EF 35-40%, global LV hypokinesis, severely dilated LV internal cavity size, mild LVH, small pericardial effusion, mild MR. - Echo 01/02/24: EF 35-40%, mild LVH, low normal RV, mild LAE, mild/ moderate AR, mild dilatation of the aortic root, measuring 40 mm  - may benefit from RHC to evaluate pressures/ fluid status although he says this would have to wait until after 05/19/24 due to getting new insurance - has been taking 6.25mg  carvedilol  at night and HA have resolved. Will change it to 3.125mg  BID so he gets the benefits from the med as long as his BP maintains - continue Farxiga  10mg  daily - continue Entresto  24/26mg  bid. He does not want to come off entresto  if BP declines - continue spiro 25mg  daily - continue torsemide  40mg  daily. BMET today and discussed possibly decreasing torsemide  - continue potassium 20meq every other day - continue to use Furoscix  if weight >= 250 although he says that this dehydrates him. Will send in RX for this - consider Cardiomems to help volume management as  needed => his current insurance won't cover. Can revisit this after he gets new insurance 10/25 - saw ADHF provider (Bensimhon) 09/24 - BNP 04/06/24 reviewed and was 25.8   2.  CAD/Elevated Troponin and chronic CP:  - H/o CAD  - RHC/ LHC 08/19/23:     1. Relatively stable appearance of the coronary arteries since last catheterization in 2021.  Chronically occluded proximal RCA now has antegrade flow, though I suspect this represents bridging collaterals.  Left-to-right collateral remain as well as moderate LAD and LCx disease.    2. Mildly elevated left heart filling pressures (LVEDP 20 mmHg, PCWP 18 mmHg).    3. Moderate pulmonary hypertension (mean PA 41 mmHg, PVR 5.3 WU).    4. Moderately reduced Fick cardiac output/index (CO 4.3 L/min, CI 1.9 L/min/m^2). - continue rosuvastatin  20mg  daily - saw cardiology Florestine) 07/25 - lipid panel 01/01/24: LDL 94, triglycerides 341 - insurance is currently not covering repatha  injection   3.  HTN:  - BP 126/76. He checks it daily at home - saw PCP Mazie) 08/25 - BMP 04/06/24 reviewed: sodium 137, potassium 4.6, creatinine 2.16 & GFR 36 - BMET today   4.  H/o PE/Chronic anticoagulation:  - continue xarelto  20mg  daily - no recent bleeding   5.  DM2- - saw nephrology (Korrapati) 08/23  - A1c 04/02/24 reviewed and was 8.8% (worsening) - adjusting insulin  with improving glucose levels   6. OSA- - Sleep study 3/22: AHI 28  - sleep study 12/23; AHI 45.1 - sleep study 11/24 AHI 70.0 - wearing CPAP nightly unless feeling very congested - saw pulmonology Jodeen) 11/24  7: AF- - saw EP (Riddle) 08/25 - cardioverted 11/06/23 - continue amiodarone  400mg  BID taper - continue ASA 81mg  daily - continue xarelto  20mg  daily   Return in 2 months after he gets new insurance, sooner if needed.   Ellouise DELENA Class, FNP 04/12/24

## 2024-04-13 ENCOUNTER — Ambulatory Visit: Payer: Self-pay | Admitting: Family

## 2024-04-13 ENCOUNTER — Encounter: Payer: Self-pay | Admitting: Family

## 2024-04-13 ENCOUNTER — Other Ambulatory Visit (HOSPITAL_COMMUNITY): Payer: Self-pay

## 2024-04-13 ENCOUNTER — Other Ambulatory Visit: Payer: Self-pay

## 2024-04-13 ENCOUNTER — Ambulatory Visit (HOSPITAL_BASED_OUTPATIENT_CLINIC_OR_DEPARTMENT_OTHER): Admitting: Family

## 2024-04-13 ENCOUNTER — Other Ambulatory Visit
Admission: RE | Admit: 2024-04-13 | Discharge: 2024-04-13 | Disposition: A | Discharge status: Home / Self Care | Source: Ambulatory Visit | Attending: Family | Admitting: Family

## 2024-04-13 ENCOUNTER — Other Ambulatory Visit: Payer: Self-pay | Admitting: Pharmacist

## 2024-04-13 ENCOUNTER — Encounter (HOSPITAL_COMMUNITY): Payer: Self-pay

## 2024-04-13 VITALS — BP 126/76 | HR 82 | Wt 252.5 lb

## 2024-04-13 DIAGNOSIS — I25118 Atherosclerotic heart disease of native coronary artery with other forms of angina pectoris: Secondary | ICD-10-CM

## 2024-04-13 DIAGNOSIS — Z794 Long term (current) use of insulin: Secondary | ICD-10-CM

## 2024-04-13 DIAGNOSIS — G4733 Obstructive sleep apnea (adult) (pediatric): Secondary | ICD-10-CM | POA: Diagnosis not present

## 2024-04-13 DIAGNOSIS — E1122 Type 2 diabetes mellitus with diabetic chronic kidney disease: Secondary | ICD-10-CM

## 2024-04-13 DIAGNOSIS — N1832 Chronic kidney disease, stage 3b: Secondary | ICD-10-CM

## 2024-04-13 DIAGNOSIS — I480 Paroxysmal atrial fibrillation: Secondary | ICD-10-CM

## 2024-04-13 DIAGNOSIS — I5022 Chronic systolic (congestive) heart failure: Secondary | ICD-10-CM

## 2024-04-13 DIAGNOSIS — I10 Essential (primary) hypertension: Secondary | ICD-10-CM | POA: Diagnosis not present

## 2024-04-13 DIAGNOSIS — I2699 Other pulmonary embolism without acute cor pulmonale: Secondary | ICD-10-CM | POA: Diagnosis not present

## 2024-04-13 LAB — BASIC METABOLIC PANEL WITH GFR
Anion gap: 15 (ref 5–15)
BUN: 41 mg/dL — ABNORMAL HIGH (ref 6–20)
CO2: 23 mmol/L (ref 22–32)
Calcium: 9.9 mg/dL (ref 8.9–10.3)
Chloride: 101 mmol/L (ref 98–111)
Creatinine, Ser: 2.01 mg/dL — ABNORMAL HIGH (ref 0.61–1.24)
GFR, Estimated: 38 mL/min — ABNORMAL LOW (ref >60)
Glucose, Bld: 204 mg/dL — ABNORMAL HIGH (ref 70–99)
Potassium: 4.4 mmol/L (ref 3.5–5.1)
Sodium: 139 mmol/L (ref 135–145)

## 2024-04-13 MED ORDER — FUROSCIX 80 MG/10ML ~~LOC~~ CTKT
80.0000 mg | CARTRIDGE | Freq: Every day | SUBCUTANEOUS | 2 refills | Status: DC | PRN
  Filled 2024-04-13: qty 10, 10d supply, fill #0

## 2024-04-13 MED ORDER — CARVEDILOL 3.125 MG PO TABS: 3.1250 mg | ORAL_TABLET | Freq: Every day | ORAL | 3 refills | Status: DC

## 2024-04-13 MED ORDER — INSULIN PEN NEEDLE 32G X 4 MM MISC: 11 refills | Status: AC

## 2024-04-13 MED FILL — Continuous Glucose System Sensor: 30 days supply | Qty: 2 | Fill #2 | Status: AC

## 2024-04-13 NOTE — Patient Instructions (Addendum)
 Medication Changes:  DECREASE carvedilol  3.125mg  (1 tab) two times daily  Lab Work:  Go over to the [facility]. Go pass the gift shop and have your blood work completed.  We will only call you if the results are abnormal or if the provider would like to make medication changes.  No news is good news.   Follow-Up in: Please follow up with the Advanced [facility] Failure Clinic in October with Dr. Cherrie. We do not currently have that schedule. Please give us  a call in late September in order to schedule your appointment for October.    Thank you for choosing [facility].    At the Advanced Heart Failure Clinic, you and your health needs are our priority. We have a designated team specialized in the treatment of Heart Failure. This Care Team includes your primary Heart Failure Specialized Cardiologist (physician), Advanced Practice Providers (APPs- Physician Assistants and Nurse Practitioners), and Pharmacist who all work together to provide you with the care you need, when you need it.   You may see any of the following providers on your designated Care Team at your next follow up:  Dr. Toribio Cherrie Dr. Ezra Shuck Dr. Ria Commander Dr. Morene Brownie Ellouise Class, FNP Jaun Bash, RPH-CPP  Please be sure to bring in all your medications bottles to every appointment.   Need to Contact Us :  If you have any questions or concerns before your next appointment please send us  a message through [facility] or call our office at [phone].    TO LEAVE A MESSAGE FOR THE NURSE SELECT OPTION 2, PLEASE LEAVE A MESSAGE INCLUDING: YOUR NAME DATE OF BIRTH CALL BACK NUMBER REASON FOR CALL**this is important as we prioritize the call backs  YOU WILL RECEIVE A CALL BACK THE SAME DAY AS LONG AS YOU CALL BEFORE 4:00 PM

## 2024-04-13 NOTE — Progress Notes (Signed)
 Specialty Pharmacy Initial Fill Coordination Note  Foy Vanduyne. is a 53 y.o. male contacted today regarding initial fill of specialty medication(s) Furosemide  (Furoscix )   Patient requested Delivery   Delivery date: 04/15/24   Verified address: [address] [place] [address]   Medication will be filled on 04/15/2024.   Patient is aware of $0 copayment.  Disenrolling patient as this is a one time fill only.

## 2024-04-13 NOTE — Progress Notes (Unsigned)
 Patient requesting renewal of his pen needle prescription to be sent to pharmacy. Ordered pended.  Would you please send to [facility] for patient?  Thank you!  Sharyle

## 2024-04-16 ENCOUNTER — Ambulatory Visit (HOSPITAL_COMMUNITY): Admit: 2024-04-16 | Admitting: Cardiology

## 2024-04-16 ENCOUNTER — Encounter (HOSPITAL_COMMUNITY): Payer: Self-pay

## 2024-04-16 SURGERY — ATRIAL FIBRILLATION ABLATION
Anesthesia: General

## 2024-04-27 ENCOUNTER — Telehealth: Payer: Self-pay | Admitting: Family Medicine

## 2024-04-27 NOTE — Telephone Encounter (Signed)
 [facility] faxed refill request for the following medications:  cetirizine  (ZYRTEC ) 10 MG tablet    Please advise.

## 2024-04-28 ENCOUNTER — Other Ambulatory Visit: Payer: Self-pay

## 2024-04-28 ENCOUNTER — Ambulatory Visit (INDEPENDENT_AMBULATORY_CARE_PROVIDER_SITE_OTHER): Admitting: Family Medicine

## 2024-04-28 ENCOUNTER — Encounter: Payer: Self-pay | Admitting: Family Medicine

## 2024-04-28 VITALS — BP 102/58 | HR 100 | Temp 98.5°F | Ht 71.0 in | Wt 258.0 lb

## 2024-04-28 DIAGNOSIS — R197 Diarrhea, unspecified: Secondary | ICD-10-CM | POA: Diagnosis not present

## 2024-04-28 DIAGNOSIS — J302 Other seasonal allergic rhinitis: Secondary | ICD-10-CM

## 2024-04-28 DIAGNOSIS — Z794 Long term (current) use of insulin: Secondary | ICD-10-CM | POA: Diagnosis not present

## 2024-04-28 DIAGNOSIS — N1832 Chronic kidney disease, stage 3b: Secondary | ICD-10-CM | POA: Diagnosis not present

## 2024-04-28 DIAGNOSIS — E0822 Diabetes mellitus due to underlying condition with diabetic chronic kidney disease: Secondary | ICD-10-CM | POA: Diagnosis not present

## 2024-04-28 MED ORDER — NOVOLOG FLEXPEN RELION 100 UNIT/ML ~~LOC~~ SOPN: 20.0000 [IU] | PEN_INJECTOR | Freq: Three times a day (TID) | SUBCUTANEOUS | 3 refills | Status: DC

## 2024-04-28 MED ORDER — BASAGLAR KWIKPEN 100 UNIT/ML ~~LOC~~ SOPN: 50.0000 [IU] | PEN_INJECTOR | Freq: Every day | SUBCUTANEOUS | 3 refills | Status: DC

## 2024-04-28 MED ORDER — CETIRIZINE HCL 10 MG PO TABS: 10.0000 mg | ORAL_TABLET | Freq: Every day | ORAL | 2 refills | Status: DC | PRN

## 2024-04-28 NOTE — Telephone Encounter (Signed)
 Converted into refill request.

## 2024-04-28 NOTE — Progress Notes (Signed)
   Established Patient Office Visit  Subjective   Patient ID: Gabriel [name]., male    DOB: 1970-09-17  Age: 53 y.o. MRN: [id number]  Chief Complaint  Patient presents with  . Diabetes    Patient reports that he does check his glucose at home, starting run better than it has in the past.  Wanting to discuss medication changes, he was here on 04/02/2024 and his A1C was 8.8.  States that his insurance will be changing and they will not cover his medications so will need to prescribed something else.  Patient got flu shot yesterday from [name].  Diabetic Eye exam wants to discuss.   Discussed the use of AI scribe software for clinical note transcription with the patient, who gave verbal consent to proceed.  History of Present Illness  Mounjaro  7.5mg  20 units 50 units lantus   Little bit of time Needs a new fast and long acting  {History (Optional):23778}    04/28/2024    1:35 PM 04/02/2024    8:46 AM 03/12/2024    9:55 AM  Depression screen PHQ 2/9  Decreased Interest 0 1 0  Down, Depressed, Hopeless 0 0 0  PHQ - 2 Score 0 1 0  Altered sleeping 0 1   Tired, decreased energy 0 3   Change in appetite 0 3   Feeling bad or failure about yourself  0 0   Trouble concentrating 0 1   Moving slowly or fidgety/restless 0 0   Suicidal thoughts 0 0   PHQ-9 Score 0 9   Difficult doing work/chores Not difficult at all Somewhat difficult        04/28/2024    1:35 PM 04/02/2024    8:47 AM 01/01/2024    8:43 AM 11/20/2023   10:46 AM  GAD 7 : Generalized Anxiety Score  Nervous, Anxious, on Edge 0 0 0 0  Control/stop worrying 0 0 0 0  Worry too much - different things 0 1 0 0  Trouble relaxing 0 0 1 0  Restless 0 0 0 0  Easily annoyed or irritable 0 1 0 0  Afraid - awful might happen 0 0 0 0  Total GAD 7 Score 0 2 1 0  Anxiety Difficulty Not difficult at all Somewhat difficult Not difficult at all Not difficult at all     ROS  Negative unless indicated in HPI   Objective:      BP (!) 102/58 (BP Location: Left Arm, Patient Position: Sitting, Cuff Size: Large)   Pulse 100   Temp 98.5 F (36.9 C) (Oral)   Ht 5' 11 (1.803 m)   Wt 258 lb (117 kg)   SpO2 100%   BMI 35.98 kg/m  {Vitals History (Optional):23777}  Physical Exam   No results found for any visits on 04/28/24.  {Labs (Optional):23779}  The ASCVD Risk score (Arnett DK, et al., 2019) failed to calculate for the following reasons:   Risk score cannot be calculated because patient has a medical history suggesting prior/existing ASCVD    Assessment & Plan:  There are no diagnoses linked to this encounter.   Assessment and Plan Assessment & Plan     No follow-ups on file.    Curtis DELENA Boom, FNP

## 2024-04-29 ENCOUNTER — Encounter: Payer: Self-pay | Admitting: Family Medicine

## 2024-05-03 ENCOUNTER — Other Ambulatory Visit: Payer: Self-pay | Admitting: Family Medicine

## 2024-05-03 ENCOUNTER — Telehealth: Payer: Self-pay | Admitting: Family Medicine

## 2024-05-03 DIAGNOSIS — B351 Tinea unguium: Secondary | ICD-10-CM

## 2024-05-03 MED ORDER — CICLOPIROX 8 % EX SOLN: Freq: Every day | CUTANEOUS | 5 refills | Status: DC

## 2024-05-03 NOTE — Telephone Encounter (Signed)
 [facility] on [address] is asking for refills on Ciclopirox  8%

## 2024-05-03 NOTE — Telephone Encounter (Signed)
 Refilled per patient to [facility]

## 2024-05-04 LAB — HM DIABETES EYE EXAM

## 2024-05-05 ENCOUNTER — Ambulatory Visit: Payer: Self-pay | Admitting: Family Medicine

## 2024-05-06 ENCOUNTER — Other Ambulatory Visit: Payer: Self-pay

## 2024-05-06 NOTE — Patient Outreach (Signed)
 Complex Care Management   Visit Note  05/06/2024  Name:  Gabriel [name]. MRN: [id number] DOB: 1971-02-21  Situation: Referral received for Complex Care Management related to Diabetes with Complications I obtained verbal consent from Patient.  Visit completed with Patient  on the phone  Background:   Past Medical History:  Diagnosis Date   Anginal pain (HCC)    Aortic atherosclerosis (HCC)    Aortic root dilation (HCC) 11/28/2016   a.) TTE 11/28/2016: 43 mm; b.) TTE 11/23/2019: 38 mm; c.) TTE 08/14/2022: 40 mm; d.) TTE 03/08/2023: 43 mm; e.) TTE 01/02/2024: 40 mm   Arteriosclerosis of right carotid artery    a.) carotid doppler 04/29/2017: <50% RICA   Atrial fibrillation with RVR (HCC)    a.) CHA2DS2VASc = 6 (HFrEF, HTN, CVA x 2, vascular disease, T2DM) as of 01/27/2024; b.) s/p DCCV 11/06/2023 --> 200 J x 1 -->  NSR; c.) rate/rhythm maintained on oral amiodarone  + carvedilol ; chronically anticoagulated with rivaroxaban    CAD (coronary artery disease)    a.) MV 04/2015: low risk; b.) LHC 01/06/2017: minor irregs in LAD/Diag/LCX/OM, RCA 40p/m/d; c.) R/LHC 06/13/2020: 50% dLAD, 30% p-mLCx, 40% OM1, 100% pRCA (L-R collats) - med mgmt; d.) [facility] 08/19/2023: 50% dLAD, 30% p-mLCx, 40% OM1, 100% pRCA (L-R collats), 50% p-mRCA - med mgmt   Cardiomegaly    Cerebellar infarct (HCC)    a.) brain MRI 06/23/2023: tiny chronic infarct within the RIGHT cerebellar hemisphere; unsure of timing of neurological event; no deficits   Chest wall pain, chronic    Chronic 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    Chronic Troponin Elevation    CKD (chronic kidney disease), stage II-III    COPD (chronic obstructive pulmonary disease) (HCC)    CPAP (continuous positive airway pressure) dependence 03/08/2023   Depression    DM (diabetes mellitus), type 2 (HCC)    Dyspnea    GERD (gastroesophageal reflux disease)    Headache    HFrEF (heart failure with reduced ejection fraction) (HCC)    History of 2019 novel  coronavirus disease (COVID-19) 03/01/2022   History of kidney stones    History of methicillin resistant staphylococcus aureus (MRSA)    Hyperlipidemia    Hypertension    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 12/2015 noted on tele-->amio;  b. 12/2015 Event monitor: no VT noted.   Obesity (BMI 30.0-34.9)    On rivaroxaban  therapy    OSA treated with BiPAP    Pneumonia    Polyp of descending colon    Psoriasis    Psoriatic arthritis (HCC)    a.) Tx'd with ixekizumab   Pulmonary HTN (HCC) 02/29/2016   a.) TTE 02/29/2016: RVSP 47;  b.) TTE 06/26/2019: RVSP 58.4; c.) TTE 12/08/2019: RVSP 40.8; d.) TTE 06/08/2020: RVSP 44.7; e.) R/LHC 06/08/2020: mRA 9, mPA 40, mPWCP 17, AO sat 98, PA sat 61, CO 3.1, CI 1.3, PVR 7.4, LVEDP 20; f.) R/LHC 08/18/2020: mRA 5, mPA 41, mPCWP 18, AO sat 92, PA sat 59, CO 4.3, CI 1.9, PVR 5.3   Recurrent pulmonary emboli (HCC) 06/07/2020   06/07/20: small bilateral PEs.  12/31/19: RUL and RLL PEs.   Secondary polycythemia    a.) negative for JAK2 with reflex and BCR-ABL mutations; seeing hematology with etiology felt to be secondary to underlying COPD (quit smoking 04/2020), heart  disease, OSAH, and obesity   Stroke [facility])    Syncope    a. 01/2016 - felt to be vasovagal.   Thrombocytopenia (HCC)     Assessment: Patient Reported Symptoms:  Cognitive Cognitive Status: No symptoms reported   Health Maintenance Behaviors: Annual physical exam Healing Pattern: Average Health Facilitated by: Healthy diet  Neurological Neurological Review of Symptoms: No symptoms reported Neurological Comment: headaches improved  HEENT HEENT Symptoms Reported: Other: HEENT Comment: eye exam tuesday - no retinopathy, ENT pending, left eye ad left side nose running all the time    Cardiovascular Cardiovascular Symptoms Reported: Palpitations, Chest pain or discomfort Cardiovascular Management Strategies: Routine screening Weight: 260 lb (117.9 kg) Cardiovascular Comment: chest pain occasionally, not bad, no associated sx and not needing nitro - unchanged since MI, palpitations were frequent but much improved since BG controlled, no CHF sx, weight up 2 lbs today and call out to Cardiology - usually instructs to take more torsemide , had 60 mg today (usu 40 mg)  Respiratory Respiratory Symptoms Reported: Shortness of breath Other Respiratory Symptoms: reports SOB unchanged, BiPap regularly Respiratory Management Strategies: Routine screening, Medication therapy  Endocrine Endocrine Symptoms Reported: No symptoms reported Is patient diabetic?: Yes Is patient checking blood sugars at home?: Yes List most recent blood sugar readings, include date and time of day: FBG 176 today reports improved overall, no lows and fewer highs ranging 120-180 feeling better overall, started Mounjaro  and incrasing dose monthly    Gastrointestinal Additional Gastrointestinal Details: US  neg for acute, some abdominal pain, no more diarrhea      Genitourinary Genitourinary Symptoms Reported: No symptoms reported Genitourinary Management Strategies: Medication therapy  Integumentary Integumentary Symptoms  Reported: No symptoms reported    Musculoskeletal Musculoskelatal Symptoms Reviewed: Back pain, Joint pain, Muscle pain Additional Musculoskeletal Details: not needing cane as often Talzhelping a lot Musculoskeletal Management Strategies: Routine screening Falls in the past year?: No Number of falls in past year: 1 or less Was there an injury with Fall?: No Fall Risk Category Calculator: 0 Patient Fall Risk Level: Low Fall Risk Patient at Risk for Falls Due to: Orthopedic patient Fall risk Follow up: Falls prevention discussed, Falls evaluation completed  Psychosocial Psychosocial Symptoms Reported: No symptoms reported          05/06/2024    PHQ2-9 Depression Screening   Little interest or pleasure in doing things Not at all  Feeling down, depressed, or hopeless Not at all  PHQ-2 - Total Score 0  Trouble falling or staying asleep, or sleeping too much    Feeling tired or having little energy    Poor appetite or overeating     Feeling bad about yourself - or that you are a failure or have let yourself or your family down    Trouble concentrating on things, such as reading the newspaper or watching television  Moving or speaking so slowly that other people could have noticed.  Or the opposite - being so fidgety or restless that you have been moving around a lot more than usual    Thoughts that you would be better off dead, or hurting yourself in some way    PHQ2-9 Total Score    If you checked off any problems, how difficult have these problems made it for you to do your work, take care of things at home, or get along with other people    Depression Interventions/Treatment      Vitals:   05/06/24 1340  BP: (!) 140/80    Medications Reviewed Today     Reviewed by Devra Lands, RN (Registered Nurse) on 05/06/24 at 1337  Med List Status: <None>   Medication Order Taking? Sig Documenting Provider Last Dose Status Informant  Accu-Chek Softclix Lancets lancets [id number] Yes  USE UP TO FOUR TIMES DAILY [provider]  Active Self, Pharmacy Records  albuterol  (VENTOLIN  HFA) 108 (90 Base) MCG/ACT inhaler [id number] Yes Inhale 2 puffs into the lungs every 4 (four) hours as needed for wheezing or shortness of breath. Isadora Hose, MD  Active Self, Pharmacy Records           Med Note [facility], [place] A   Fri Feb 06, 2024  2:26 PM) PRN  allopurinol  (ZYLOPRIM ) 300 MG tablet [id number] Yes Take 300 mg by mouth every morning. [provider]  Active Self, Pharmacy Records  amiodarone  (PACERONE ) 200 MG tablet [id number] Yes Take 1 tablet (200 mg total) by mouth daily. Cindie Ole DASEN, MD  Active Self, Pharmacy Records  aspirin  EC 81 MG tablet [id number] Yes Take 81 mg by mouth every morning. [provider]  Active Self, Pharmacy Records  BD PEN NEEDLE NANO 2ND GEN 32G X 4 MM MISC [id number] Yes USE  AS DIRECTED AT BEDTIME Emilio Kelly DASEN, FNP  Active Self, Pharmacy Records  blood glucose meter kit and supplies KIT [id number] Yes Dispense based on patient and insurance preference. Use up to four times daily as directed. Emilio Kelly DASEN, FNP  Active Self, Pharmacy Records  butalbital -acetaminophen -caffeine  (FIORICET ) 50-325-40 MG tablet [id number]  Take 1 tablet by mouth every 4 (four) hours as needed for migraine.  Patient not taking: Reported on 05/06/2024   [provider]  Active Self, Pharmacy Records  carvedilol  (COREG ) 3.125 MG tablet [id number] Yes Take 1 tablet (3.125 mg total) by mouth daily at 12 noon. [place] A, FNP  Active   cetirizine  (ZYRTEC ) 10 MG tablet [id number] Yes Take 1 tablet (10 mg total) by mouth daily as needed for allergies. Wellington Beams A, FNP  Active   ciclopirox  (PENLAC ) 8 % solution [id number] Yes Apply topically at bedtime. Apply over nail and surrounding skin. Apply daily over previous coat. After seven (7) days, may remove with alcohol and continue cycle. Wellington Beams LABOR, FNP  Active   Continuous Glucose  Sensor (FREESTYLE LIBRE 3 PLUS SENSOR) [place] [id number] Yes Place 1 sensor on the skin every 15 days. Use to check glucose continuously Wellington Beams A, FNP  Active   dapagliflozin  propanediol (FARXIGA ) 10 MG TABS tablet [id number] Yes Take 1 tablet (10 mg total) by mouth daily. [place] LABOR, FNP  Active Self, Pharmacy Records  ezetimibe  (ZETIA ) 10 MG tablet [id number] Yes Take 1 tablet (10 mg total) by mouth daily. Gollan, Timothy J, MD  Active   fenofibrate  (TRICOR ) 145 MG tablet [id number] Yes Take 1 tablet (145 mg total) by  mouth every evening. Gollan, Timothy J, MD  Active   fluticasone  (FLONASE ) 50 MCG/ACT nasal spray [id number] Yes Place 2 sprays into both nostrils daily as needed for allergies or rhinitis. Home med. Poggi, Norleen PARAS, MD  Active Self, Pharmacy Records           Med Note [facility], [place] A   Fri Feb 06, 2024  2:26 PM) prn  Furosemide  (FUROSCIX ) 80 MG/10ML CTKT [id number] Yes Inject 80 mg into the skin daily as needed. For weight above 250lbs from edema [place] A, FNP  Active   insulin  aspart (NOVOLOG  FLEXPEN) 100 UNIT/ML FlexPen [id number] Yes Inject 20 Units into the skin 3 (three) times daily with meals. INJECT 20 UNITS + CORRECTION BEFORE BREAKFAST/LUNCH AND SUPPER (CORRECTION = 1 UNIT PER 50 MG/DL ABOVE 849); MAX DAILY DOSE 77 UNITS Wellington Beams A, FNP  Active   Insulin  Glargine (BASAGLAR  KWIKPEN) 100 UNIT/ML [id number] Yes Inject 50 Units into the skin at bedtime. Wellington Beams LABOR, FNP  Active   Insulin  Pen Needle 32G X 4 MM MISC [id number] Yes USE WITH INSULIN  IN THE MORNING, AT NOON, IN THE EVENING AND AT BEDTIME Wellington Beams A, FNP  Active   ipratropium-albuterol  (DUONEB) 0.5-2.5 (3) MG/3ML SOLN [id number]  Take 3 mLs by nebulization every 6 (six) hours as needed.  Patient not taking: Reported on 05/06/2024   Wellington Beams LABOR, FNP  Active Self, Pharmacy Records  meclizine  (ANTIVERT ) 25 MG tablet [id number]  Take 0.5 tablets (12.5 mg total) by mouth 3 (three) times  daily as needed for dizziness.  Patient not taking: Reported on 05/06/2024   Poggi, John J, MD  Consider Medication Status and Discontinue Self, Pharmacy Records           Med Note [facility], [place] A   Fri Feb 06, 2024  2:27 PM) prn  nitroGLYCERIN  (NITROSTAT ) 0.4 MG SL tablet [id number] Yes Place 1 tablet (0.4 mg total) under the tongue every 5 (five) minutes x 3 doses as needed for chest pain. Gollan, Timothy J, MD  Active   omeprazole  (PRILOSEC) 40 MG capsule [id number] Yes Take 1 capsule (40 mg total) by mouth daily. Wellington Beams LABOR, FNP  Active   potassium chloride  SA (KLOR-CON  M) 20 MEQ tablet [id number] Yes Take 1 tablet (20 mEq total) by mouth every other day. AM Gollan, Timothy J, MD  Active   rivaroxaban  (XARELTO ) 20 MG TABS tablet [id number] Yes Take 1 tablet (20 mg total) by mouth daily with supper. Bensimhon, Toribio SAUNDERS, MD  Active Self, Pharmacy Records  rosuvastatin  (CRESTOR ) 20 MG tablet [id number] Yes Take 1 tablet (20 mg total) by mouth daily. Wellington Beams LABOR, FNP  Active   sacubitril -valsartan  (ENTRESTO ) 24-26 MG [id number] Yes Take 1 tablet by mouth 2 (two) times daily. [place] A, FNP  Active   spironolactone  (ALDACTONE ) 25 MG tablet [id number] Yes Take 25 mg by mouth daily. [provider]  Active   TALTZ 80 MG/ML SOAJ [id number] Yes Inject 3 mLs into the skin every 28 (twenty-eight) days. [provider]  Active Self, Pharmacy Records           Med Note CHRISTIE ALEXANDER   Fri Aug 08, 2023  3:20 PM)    tirzepatide  (MOUNJARO ) 7.5 MG/0.5ML Pen [id number] Yes Inject 7.5 mg into the skin once a week.  Patient taking differently: Inject 7.5 mg into the skin once a week. Will increase to 10mg  October - 12.5 mg November  15 mg in December   [place],  Curtis LABOR, FNP  Active   Torsemide  40 MG TABS [id number] Yes Take 40 mg by mouth daily. Gollan, Timothy J, MD  Active             Recommendation:   PCP Follow-up Continue Current Plan of Care  Follow Up Plan:    Telephone follow-up 6 Nate Perri  Nestora Duos, MSN, RN [facility]  [facility] RN Care Manager Direct Dial : [phone] Fax: [phone]

## 2024-05-06 NOTE — Patient Instructions (Signed)
 Visit Information  [name] was given information about Medicaid Managed Care team care coordination services as a part of their [employer] Medicaid benefit.   If you would like to schedule transportation through your [employer] [facility] plan, please call the following number at least 2 days in advance of your appointment: [phone]  If you are experiencing a behavioral health crisis, call the [employer] [place]  Behavioral Health Crisis Line at [phone] (TTY   Visit Information  [name] was given information about Medicaid Managed Care team care coordination services as a part of their [employer] Medicaid benefit.   If you would like to schedule transportation through your [employer] [facility] plan, please call the following number at least 2 days in advance of your appointment: [phone]  If you are experiencing a behavioral health crisis, call the [employer] [place]  Behavioral Health Crisis Line at [phone]). The line is available 24 hours a day, seven days a week.   Please see education materials related to NONE provided by MyChart link.  Patient verbalizes understanding of instructions and care plan provided today and agrees to view in MyChart. Active MyChart status and patient understanding of how to access instructions and care plan via MyChart confirmed with patient.     Telephone follow up appointment with Managed Medicaid care management team member scheduled for: [name] 06/17/2024 at 1:30 pm  Nestora Duos, MSN, RN [place]  [facility], [facility] RN Care Manager Direct Dial : [phone] Fax: [phone]   Following is a copy of your plan of care:  There are no care plans that you recently modified to display for this patient.

## 2024-05-13 MED FILL — Continuous Glucose System Sensor: 30 days supply | Qty: 2 | Fill #3 | Status: AC

## 2024-05-18 ENCOUNTER — Ambulatory Visit (INDEPENDENT_AMBULATORY_CARE_PROVIDER_SITE_OTHER): Payer: Medicaid Other

## 2024-05-18 DIAGNOSIS — I5022 Chronic systolic (congestive) heart failure: Secondary | ICD-10-CM | POA: Diagnosis not present

## 2024-05-18 NOTE — Progress Notes (Unsigned)
  Electrophysiology Office Follow up Visit Note:    Date:  05/18/2024   ID:  Gabriel LOISE Darrel Mickey., DOB 09/17/1970, MRN [id number]  PCP:  [name] LABOR, FNP  [facility] Cardiologist:  [name], MD  [facility] Electrophysiologist:  [name], MD    Interval History:     Gabriel Starace. is a 53 y.o. male who presents for a follow up visit.   I last saw the patient March 17, 2024.  He has a history of persistent atrial fibrillation on amiodarone  and chronic systolic heart failure.  He follows with the heart failure clinic.  At the last appointment he was reporting lightheadedness episodes.  We discussed postponing his ablation.         Past medical, surgical, social and family history were reviewed.  ROS:   Please see the history of present illness.    All other systems reviewed and are negative.  EKGs/Labs/Other Studies Reviewed:    The following studies were reviewed today:    April 09, 2024 ZIO monitor No atrial fibrillation      Physical Exam:    VS:  There were no vitals taken for this visit.    Wt Readings from Last 3 Encounters:  05/06/24 260 lb (117.9 kg)  04/28/24 258 lb (117 kg)  04/13/24 252 lb 8 oz (114.5 kg)     GEN: no distress CARD: RRR, No MRG RESP: No IWOB. CTAB.      ASSESSMENT:    No diagnosis found. PLAN:    In order of problems listed above:  #persistent atrial fibrillation #High risk med monitoring-amiodarone  Maintaining sinus rhythm on amiodarone .  No A-fib on the most recent heart monitor.  I discussed treatment options again today.  We discussed continuing with antiarrhythmic drug therapy versus proceeding with catheter ablation.  For now, continue antiarrhythmic drug therapy.  Will revisit this in about 6 months.  If he shows clinical stability in that interval, can revisit catheter  ablation.  Continue Xarelto  for stroke prophylaxis  #Chronic systolic heart failure NYHA class II-III.  Warm and dry  on exam today.  Follows with the heart failure clinic.  Continue GDMT.  Follow-up with EP APP in 6 months.   Signed, OLE HOLTS, MD, [facility], [facility] 05/18/2024 5:51 PM    Electrophysiology [facility]

## 2024-05-19 ENCOUNTER — Encounter: Payer: Self-pay | Admitting: Oncology

## 2024-05-19 ENCOUNTER — Ambulatory Visit: Attending: Cardiology | Admitting: Cardiology

## 2024-05-19 ENCOUNTER — Encounter: Payer: Self-pay | Admitting: Cardiology

## 2024-05-19 VITALS — BP 130/80 | HR 85 | Ht 71.0 in | Wt 260.1 lb

## 2024-05-19 DIAGNOSIS — I4819 Other persistent atrial fibrillation: Secondary | ICD-10-CM | POA: Diagnosis not present

## 2024-05-19 DIAGNOSIS — I5022 Chronic systolic (congestive) heart failure: Secondary | ICD-10-CM | POA: Diagnosis not present

## 2024-05-19 NOTE — Patient Instructions (Signed)
 Medication Instructions:  Your physician recommends that you continue on your current medications as directed. Please refer to the Current Medication list given to you today.  *If you need a refill on your cardiac medications before your next appointment, please call your pharmacy*  Follow-Up: At [facility], you and your health needs are our priority.  As part of our continuing mission to provide you with exceptional heart care, our providers are all part of one team.  This team includes your primary Cardiologist (physician) and Advanced Practice Providers or APPs (Physician Assistants and Nurse Practitioners) who all work together to provide you with the care you need, when you need it.  Your next appointment:   6 months  Provider:   Suzann Riddle, NP

## 2024-05-20 ENCOUNTER — Ambulatory Visit: Payer: Self-pay | Admitting: Cardiology

## 2024-05-20 LAB — CUP PACEART REMOTE DEVICE CHECK
Battery Remaining Percentage: 77 %
Date Time Interrogation Session: 20250930142700
HighPow Impedance: 85 Ohm
Implantable Lead Connection Status: 753985
Implantable Lead Implant Date: 20231002
Implantable Lead Location: 753862
Implantable Lead Model: 3501
Implantable Lead Serial Number: 221766
Implantable Pulse Generator Implant Date: 20231002
Pulse Gen Serial Number: 189685

## 2024-05-20 NOTE — Progress Notes (Signed)
 Remote ICD Transmission

## 2024-05-24 NOTE — Progress Notes (Signed)
 Remote ICD Transmission

## 2024-05-25 ENCOUNTER — Other Ambulatory Visit: Payer: Self-pay | Admitting: *Deleted

## 2024-05-25 DIAGNOSIS — D751 Secondary polycythemia: Secondary | ICD-10-CM

## 2024-05-26 ENCOUNTER — Encounter: Payer: Self-pay | Admitting: Oncology

## 2024-05-26 ENCOUNTER — Inpatient Hospital Stay

## 2024-05-26 ENCOUNTER — Inpatient Hospital Stay: Attending: Oncology

## 2024-05-26 ENCOUNTER — Inpatient Hospital Stay (HOSPITAL_BASED_OUTPATIENT_CLINIC_OR_DEPARTMENT_OTHER): Admitting: Oncology

## 2024-05-26 VITALS — BP 113/73 | HR 87 | Resp 18

## 2024-05-26 VITALS — BP 138/84 | HR 87 | Temp 97.3°F | Resp 16 | Wt 257.6 lb

## 2024-05-26 DIAGNOSIS — Z87891 Personal history of nicotine dependence: Secondary | ICD-10-CM | POA: Insufficient documentation

## 2024-05-26 DIAGNOSIS — G4730 Sleep apnea, unspecified: Secondary | ICD-10-CM | POA: Diagnosis not present

## 2024-05-26 DIAGNOSIS — N1832 Chronic kidney disease, stage 3b: Secondary | ICD-10-CM | POA: Diagnosis not present

## 2024-05-26 DIAGNOSIS — E1122 Type 2 diabetes mellitus with diabetic chronic kidney disease: Secondary | ICD-10-CM | POA: Diagnosis not present

## 2024-05-26 DIAGNOSIS — D751 Secondary polycythemia: Secondary | ICD-10-CM

## 2024-05-26 DIAGNOSIS — Z86711 Personal history of pulmonary embolism: Secondary | ICD-10-CM | POA: Insufficient documentation

## 2024-05-26 LAB — CBC WITH DIFFERENTIAL/PLATELET
Abs Immature Granulocytes: 0.04 K/uL (ref 0.00–0.07)
Basophils Absolute: 0.1 K/uL (ref 0.0–0.1)
Basophils Relative: 1 %
Eosinophils Absolute: 0.2 K/uL (ref 0.0–0.5)
Eosinophils Relative: 3 %
HCT: 55.9 % — ABNORMAL HIGH (ref 39.0–52.0)
Hemoglobin: 18.6 g/dL — ABNORMAL HIGH (ref 13.0–17.0)
Immature Granulocytes: 1 %
Lymphocytes Relative: 29 %
Lymphs Abs: 2 K/uL (ref 0.7–4.0)
MCH: 29.1 pg (ref 26.0–34.0)
MCHC: 33.3 g/dL (ref 30.0–36.0)
MCV: 87.3 fL (ref 80.0–100.0)
Monocytes Absolute: 0.5 K/uL (ref 0.1–1.0)
Monocytes Relative: 8 %
Neutro Abs: 4.1 K/uL (ref 1.7–7.7)
Neutrophils Relative %: 58 %
Platelets: 166 K/uL (ref 150–400)
RBC: 6.4 MIL/uL — ABNORMAL HIGH (ref 4.22–5.81)
RDW: 15 % (ref 11.5–15.5)
WBC: 6.9 K/uL (ref 4.0–10.5)
nRBC: 0 % (ref 0.0–0.2)

## 2024-05-26 LAB — IRON AND TIBC
Iron: 74 ug/dL (ref 45–182)
Saturation Ratios: 17 % — ABNORMAL LOW (ref 17.9–39.5)
TIBC: 447 ug/dL (ref 250–450)
UIBC: 373 ug/dL

## 2024-05-26 LAB — FERRITIN: Ferritin: 59 ng/mL (ref 24–336)

## 2024-05-26 NOTE — Patient Instructions (Signed)
 Therapeutic Phlebotomy, Care After The following information offers guidance on how to care for yourself after your procedure. Your health care provider may also give you more specific instructions. If you have problems or questions, contact your health care provider. What can I expect after the procedure? After therapeutic phlebotomy, it is common to have: Light-headedness or dizziness. You may feel faint. Nausea. Tiredness (fatigue). Follow these instructions at home: Eating and drinking Be sure to eat well-balanced meals for the next 24 hours. Drink enough fluid to keep your urine pale yellow. Avoid drinking alcohol on the day that you had the procedure. Activity  Return to your normal activities as told by your health care provider. Most people can go back to their normal activities right away. Avoid activities that take a lot of effort for about 5 hours after the procedure. Athletes should avoid strenuous exercise for at least 12 hours. Avoid heavy lifting or pulling for about 5 hours after the procedure. Do not lift anything that is heavier than 10 lb (4.5 kg). Change positions slowly for the remainder of the day, like from sitting to standing. This can help prevent light-headedness or fainting. If you feel light-headed, lie down until the feeling goes away. Needle insertion site care  Keep your bandage (dressing) dry. You can remove the bandage after about 5 hours or as told by your health care provider. If you have bleeding from the needle insertion site, raise (elevate) your arm and press firmly on the site until the bleeding stops. If you have bruising at the site, apply ice to the area. To do this: Put ice in a plastic bag. Place a towel between your skin and the bag. Leave the ice on for 20 minutes, 2-3 times a day for the first 24 hours. Remove the ice if your skin turns bright red so you do not damage the area. If the swelling does not go away after 24 hours, apply a warm,  moist cloth (warm compress) to the area for 20 minutes, 2-3 times a day. General instructions Do not use any products that contain nicotine  or tobacco, like cigarettes, chewing tobacco, and vaping devices, such as e-cigarettes, for at least 30 minutes after the procedure. If you need help quitting, ask your health care provider. Keep all follow-up visits. You may need to continue having regular blood tests and therapeutic phlebotomy treatments as directed. Contact a health care provider if: You have redness, swelling, or pain at the needle insertion site. Fluid or blood is coming from the needle insertion site. Pus or a bad smell is coming from the needle insertion site. The needle insertion site feels warm to the touch. You feel light-headed, dizzy, or nauseous, and the feeling does not go away. You have new bruising at the needle insertion site. You feel weaker than normal. You have a fever or chills. Get help right away if: You have chest pain. You have trouble breathing. You have severe nausea or vomiting. Summary After the procedure, it is common to have some light-headedness, dizziness, nausea, or tiredness (fatigue). Be sure to eat well-balanced meals for the next 24 hours. Drink enough fluid to keep your urine pale yellow. Return to your normal activities as told by your health care provider. Keep all follow-up visits. You may need to continue having regular blood tests and therapeutic phlebotomy treatments as directed.  This information is not intended to replace advice given to you by your health care provider. Make sure you discuss any questions you  have with your health care provider. Document Revised: 01/21/2023 Document Reviewed: 01/31/2021 Elsevier Patient Education  2024 Elsevier Inc.Therapeutic Phlebotomy Discharge Instructions  - Increase your fluid intake over the next 4 hours  - No smoking for 30 minutes  - Avoid using the affected arm (the one you had the blood  drawn from) for heavy lifting or other activities.  - You may resume all normal activities after 30 minutes.  You are to notify the office if you experience:   - Persistent dizziness and/or lightheadedness -Uncontrolled or excessive bleeding at the site.

## 2024-05-26 NOTE — Progress Notes (Signed)
 [facility]  [phone] [phone]  ID: [name] OB: 04-03-1971  MR#: [id number]  RDW#:[id number]  Patient Care Team: [name] LABOR, FNP as PCP - General (Family Medicine) Perla Evalene PARAS, MD as PCP - Cardiology (Cardiology) Cindie Ole DASEN, MD as PCP - Electrophysiology (Cardiology) Donette Ellouise LABOR, FNP as Nurse Practitioner (Family Medicine) Perla Evalene PARAS, MD as Consulting Physician (Cardiology) Alana, Sharyle LABOR, RPH-CPP as Pharmacist Devra Lands, RN as [facility] Care Management Riddle, Suzann, NP as Nurse Practitioner (Clinical Cardiac Electrophysiology)  CHIEF COMPLAINT: Secondary polycythemia.  INTERVAL HISTORY: Patient returns to clinic today for repeat laboratory, further evaluation, and consideration of additional phlebotomy.  He reports feeling sluggish, but otherwise feels well. He has no neurologic complaints.  He denies any recent fevers or illnesses.  He has a good appetite and denies weight loss.  He does not complain of weakness or fatigue today.  He has no chest pain, shortness of breath, cough, or hemoptysis.  He denies any nausea, vomiting, constipation, or diarrhea.  He has no urinary complaints.  Patient offers no further specific complaints today.  REVIEW OF SYSTEMS:   Review of Systems  Constitutional: Negative.  Negative for fever, malaise/fatigue and weight loss.  Respiratory: Negative.  Negative for cough, hemoptysis and shortness of breath.   Cardiovascular: Negative.  Negative for chest pain and leg swelling.  Gastrointestinal: Negative.  Negative for abdominal pain.  Genitourinary: Negative.  Negative for dysuria.  Musculoskeletal: Negative.  Negative for back pain and joint pain.  Skin: Negative.  Negative for rash.  Neurological: Negative.  Negative for dizziness, focal weakness, weakness and headaches.  Psychiatric/Behavioral: Negative.  The patient is not nervous/anxious.     As per  HPI. Otherwise, a complete review of systems is negative.  PAST MEDICAL HISTORY: Past Medical History:  Diagnosis Date   Anginal pain    Aortic atherosclerosis    Aortic root dilation 11/28/2016   a.) TTE 11/28/2016: 43 mm; b.) TTE 11/23/2019: 38 mm; c.) TTE 08/14/2022: 40 mm; d.) TTE 03/08/2023: 43 mm; e.) TTE 01/02/2024: 40 mm   Arteriosclerosis of right carotid artery    a.) carotid doppler 04/29/2017: <50% RICA   Atrial fibrillation with RVR (HCC)    a.) CHA2DS2VASc = 6 (HFrEF, HTN, CVA x 2, vascular disease, T2DM) as of 01/27/2024; b.) s/p DCCV 11/06/2023 --> 200 J x 1 -->  NSR; c.) rate/rhythm maintained on oral amiodarone  + carvedilol ; chronically anticoagulated with rivaroxaban    CAD (coronary artery disease)    a.) MV 04/2015: low risk; b.) LHC 01/06/2017: minor irregs in LAD/Diag/LCX/OM, RCA 40p/m/d; c.) R/LHC 06/13/2020: 50% dLAD, 30% p-mLCx, 40% OM1, 100% pRCA (L-R collats) - med mgmt; d.) [facility] 08/19/2023: 50% dLAD, 30% p-mLCx, 40% OM1, 100% pRCA (L-R collats), 50% p-mRCA - med mgmt   Cardiomegaly    Cerebellar infarct (HCC)    a.) brain MRI 06/23/2023: tiny chronic infarct within the RIGHT cerebellar hemisphere; unsure of timing of neurological event; no deficits   Chest wall pain, chronic    Chronic 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    Chronic Troponin Elevation    CKD (chronic kidney disease), stage II-III    COPD (chronic obstructive pulmonary disease) (HCC)    CPAP (continuous positive airway pressure) dependence 03/08/2023   Depression    DM (diabetes mellitus), type 2 (HCC)    Dyspnea    GERD (gastroesophageal reflux disease)    Headache    HFrEF (heart failure with reduced ejection fraction) (HCC)  History of 2019 novel coronavirus disease (COVID-19) 03/01/2022   History of kidney stones    History of methicillin resistant staphylococcus aureus (MRSA)    Hyperlipidemia    Hypertension    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 12/2015 noted on tele-->amio;  b. 12/2015 Event monitor: no VT noted.   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  12/31/19: RUL and RLL PEs.   Secondary polycythemia    a.) negative for JAK2 with reflex and BCR-ABL mutations; seeing hematology with etiology felt to be secondary to underlying COPD (quit smoking 04/2020), heart disease, OSAH, and obesity   Stroke [facility])    Syncope    a. 01/2016 - felt to be vasovagal.   Thrombocytopenia     PAST SURGICAL HISTORY: Past Surgical History:  Procedure Laterality Date   CARDIOVERSION N/A 11/06/2023   Procedure:  CARDIOVERSION;  Surgeon: Zenaida Morene PARAS, MD;  Location: [facility] ORS;  Service: Cardiovascular;  Laterality: N/A;   cardioverted 11/06/23     COLONOSCOPY WITH PROPOFOL  N/A 08/01/2022   Procedure: COLONOSCOPY WITH PROPOFOL ;  Surgeon: Unk Corinn Skiff, MD;  Location: [facility] ENDOSCOPY;  Service: Gastroenterology;  Laterality: N/A;   ESOPHAGOGASTRODUODENOSCOPY (EGD) WITH PROPOFOL  N/A 08/01/2022   Procedure: ESOPHAGOGASTRODUODENOSCOPY (EGD) WITH PROPOFOL ;  Surgeon: Unk Corinn Skiff, MD;  Location: [facility] ENDOSCOPY;  Service: Gastroenterology;  Laterality: N/A;   ESOPHAGOGASTRODUODENOSCOPY (EGD) WITH PROPOFOL  N/A 10/16/2022   Procedure: ESOPHAGOGASTRODUODENOSCOPY (EGD) WITH PROPOFOL ;  Surgeon: Unk Corinn Skiff, MD;  Location: [facility] ENDOSCOPY;  Service: Gastroenterology;  Laterality: N/A;   FINGER AMPUTATION Left    Traumatic   LEFT HEART CATH AND CORONARY ANGIOGRAPHY N/A 01/06/2017   Procedure: Left Heart Cath and Coronary Angiography;  Surgeon: Darron Deatrice LABOR, MD;  Location: [facility] INVASIVE CV LAB;  Service: Cardiovascular;  Laterality: N/A;   LESION EXCISION N/A 02/16/2024   Procedure: EXCISION, LESION, SCALP;  Surgeon: Rodolph Romano, MD;  Location: [facility] ORS;  Service: General;  Laterality: N/A;   RIGHT/LEFT  HEART CATH AND CORONARY ANGIOGRAPHY N/A 06/13/2020   Procedure: RIGHT/LEFT HEART CATH AND CORONARY ANGIOGRAPHY;  Surgeon: Mady Bruckner, MD;  Location: [facility] INVASIVE CV LAB;  Service: Cardiovascular;  Laterality: N/A;   RIGHT/LEFT HEART CATH AND CORONARY ANGIOGRAPHY Bilateral 08/19/2023   Procedure: RIGHT/LEFT HEART CATH AND CORONARY ANGIOGRAPHY;  Surgeon: Mady Bruckner, MD;  Location: [facility] INVASIVE CV LAB;  Service: Cardiovascular;  Laterality: Bilateral;   SHOULDER ARTHROSCOPY WITH SUBACROMIAL DECOMPRESSION, ROTATOR CUFF REPAIR AND BICEP TENDON REPAIR Left 09/16/2023   Procedure: SHOULDER ARTHROSCOPY WITH DEBRIDEMENT, DECOMPRESSION, ROTATOR CUFF REPAIR AND BICEPS TENODESIS.;   Surgeon: [name] Norleen PARAS, MD;  Location: [facility] ORS;  Service: Orthopedics;  Laterality: Left;   SUBQ ICD IMPLANT N/A 05/20/2022   Procedure: SUBQ ICD IMPLANT;  Surgeon: Cindie Ole DASEN, MD;  Location: [facility] INVASIVE CV LAB;  Service: Cardiovascular;  Laterality: N/A;    FAMILY HISTORY: Family History  Problem Relation Age of Onset   Diabetes Mother    Diabetes Mellitus II Mother    Hypothyroidism Mother    Hypertension Mother    Kidney failure Mother        Dialysis   Heart attack Mother        67 yo approximately   Hypertension Father    Gout Father    Cancer Maternal Grandfather    Diabetes Maternal Grandfather    Cancer Paternal Aunt     ADVANCED DIRECTIVES (Y/N):  N  HEALTH MAINTENANCE: Social History   Tobacco Use   Smoking status: Former    Current packs/day: 0.00    Average packs/day: 0.5 packs/day for 33.0 years (16.5 ttl pk-yrs)    Types: Cigarettes    Start date: 05/09/1987    Quit date: 05/08/2020    Years since quitting: 4.0   Smokeless tobacco: Former    Quit date: 05/08/2020   Tobacco comments:    Quit Sept 2021  Vaping Use   Vaping status: Former  Substance Use Topics   Alcohol use: Not Currently   Drug use: No     Colonoscopy:  PAP:  Bone density:  Lipid panel:  Allergies  Allergen Reactions   Metformin  And Related Nausea And Vomiting   Ranitidine  Other (See Comments)   Zantac  [Ranitidine  Hcl] Diarrhea and Nausea Only    Night sweats    Current Outpatient Medications  Medication Sig Dispense Refill   Accu-Chek Softclix Lancets lancets USE UP TO FOUR TIMES DAILY     albuterol  (VENTOLIN  HFA) 108 (90 Base) MCG/ACT inhaler Inhale 2 puffs into the lungs every 4 (four) hours as needed for wheezing or shortness of breath. 8 g 6   allopurinol  (ZYLOPRIM ) 300 MG tablet Take 300 mg by mouth every morning.     amiodarone  (PACERONE ) 200 MG tablet Take 1 tablet (200 mg total) by mouth daily. 90 tablet 3   aspirin  EC 81 MG tablet Take 81 mg by mouth every  morning.     BD PEN NEEDLE NANO 2ND GEN 32G X 4 MM MISC USE  AS DIRECTED AT BEDTIME 200 each 0   blood glucose meter kit and supplies KIT Dispense based on patient and insurance preference. Use up to four times daily as directed. 1 each 11   butalbital -acetaminophen -caffeine  (FIORICET ) 50-325-40 MG tablet Take 1 tablet by mouth every 4 (four) hours as needed for migraine.     carvedilol  (COREG ) 3.125 MG tablet Take 1 tablet (3.125 mg total) by mouth daily at 12 noon. 60 tablet 3   cetirizine  (ZYRTEC ) 10 MG  tablet Take 1 tablet (10 mg total) by mouth daily as needed for allergies. 30 tablet 2   ciclopirox  (PENLAC ) 8 % solution Apply topically at bedtime. Apply over nail and surrounding skin. Apply daily over previous coat. After seven (7) days, may remove with alcohol and continue cycle. 6.6 mL 5   Continuous Glucose Sensor (FREESTYLE LIBRE 3 PLUS SENSOR) MISC Place 1 sensor on the skin every 15 days. Use to check glucose continuously 2 each 12   dapagliflozin  propanediol (FARXIGA ) 10 MG TABS tablet Take 1 tablet (10 mg total) by mouth daily. 90 tablet 3   ezetimibe  (ZETIA ) 10 MG tablet Take 1 tablet (10 mg total) by mouth daily. 90 tablet 3   fenofibrate  (TRICOR ) 145 MG tablet Take 1 tablet (145 mg total) by mouth every evening. 90 tablet 3   fluticasone  (FLONASE ) 50 MCG/ACT nasal spray Place 2 sprays into both nostrils daily as needed for allergies or rhinitis. Home med.     Furosemide  (FUROSCIX ) 80 MG/10ML CTKT Inject 80 mg into the skin daily as needed. For weight above 250lbs from edema 10 each 2   insulin  aspart (NOVOLOG  FLEXPEN) 100 UNIT/ML FlexPen Inject 20 Units into the skin 3 (three) times daily with meals. INJECT 20 UNITS + CORRECTION BEFORE BREAKFAST/LUNCH AND SUPPER (CORRECTION = 1 UNIT PER 50 MG/DL ABOVE 849); MAX DAILY DOSE 77 UNITS 15 mL 3   Insulin  Glargine (BASAGLAR  KWIKPEN) 100 UNIT/ML Inject 50 Units into the skin at bedtime. 45 mL 3   Insulin  Pen Needle 32G X 4 MM MISC USE WITH  INSULIN  IN THE MORNING, AT NOON, IN THE EVENING AND AT BEDTIME 400 each 11   ipratropium-albuterol  (DUONEB) 0.5-2.5 (3) MG/3ML SOLN Take 3 mLs by nebulization every 6 (six) hours as needed. 360 mL 11   meclizine  (ANTIVERT ) 25 MG tablet Take 0.5 tablets (12.5 mg total) by mouth 3 (three) times daily as needed for dizziness.     MOUNJARO  10 MG/0.5ML Pen Inject 10 mg into the skin.     nitroGLYCERIN  (NITROSTAT ) 0.4 MG SL tablet Place 1 tablet (0.4 mg total) under the tongue every 5 (five) minutes x 3 doses as needed for chest pain. 30 tablet 3   omeprazole  (PRILOSEC) 40 MG capsule Take 1 capsule (40 mg total) by mouth daily. 30 capsule 3   potassium chloride  SA (KLOR-CON  M) 20 MEQ tablet Take 1 tablet (20 mEq total) by mouth every other day. AM 45 tablet 3   rivaroxaban  (XARELTO ) 20 MG TABS tablet Take 1 tablet (20 mg total) by mouth daily with supper. 90 tablet 3   rosuvastatin  (CRESTOR ) 20 MG tablet Take 1 tablet (20 mg total) by mouth daily. 90 tablet 3   sacubitril -valsartan  (ENTRESTO ) 24-26 MG Take 1 tablet by mouth 2 (two) times daily. 180 tablet 1   spironolactone  (ALDACTONE ) 25 MG tablet Take 25 mg by mouth daily.     TALTZ 80 MG/ML SOAJ Inject 3 mLs into the skin every 28 (twenty-eight) days.     tirzepatide  (MOUNJARO ) 7.5 MG/0.5ML Pen Inject 7.5 mg into the skin once a week. (Patient taking differently: Inject 7.5 mg into the skin once a week. Will increase to 10mg  October - 12.5 mg November  15 mg in December) 6 mL 1   Torsemide  40 MG TABS Take 40 mg by mouth daily. 90 tablet 3   No current facility-administered medications for this visit.    OBJECTIVE: Vitals:   05/26/24 0936  BP: 138/84  Pulse: 87  Resp:  16  Temp: (!) 97.3 F (36.3 C)  SpO2: 97%     Body mass index is 35.93 kg/m.    ECOG FS:0 - Asymptomatic  General: Well-developed, well-nourished, no acute distress. Eyes: Pink conjunctiva, anicteric sclera. HEENT: Normocephalic, moist mucous membranes. Lungs: No audible  wheezing or coughing. Heart: Regular rate and rhythm. Abdomen: Soft, nontender, no obvious distention. Musculoskeletal: No edema, cyanosis, or clubbing. Neuro: Alert, answering all questions appropriately. Cranial nerves grossly intact. Skin: No rashes or petechiae noted. Psych: Normal affect.  LAB RESULTS:  Lab Results  Component Value Date   NA 139 04/13/2024   K 4.4 04/13/2024   CL 101 04/13/2024   CO2 23 04/13/2024   GLUCOSE 204 (H) 04/13/2024   BUN 41 (H) 04/13/2024   CREATININE 2.01 (H) 04/13/2024   CALCIUM  9.9 04/13/2024   PROT 7.8 04/06/2024   ALBUMIN  4.7 04/06/2024   AST 26 04/06/2024   ALT 26 04/06/2024   ALKPHOS 61 04/06/2024   BILITOT 1.0 04/06/2024   GFRNONAA 38 (L) 04/13/2024   GFRAA >60 05/14/2020    Lab Results  Component Value Date   WBC 6.9 05/26/2024   NEUTROABS 4.1 05/26/2024   HGB 18.6 (H) 05/26/2024   HCT 55.9 (H) 05/26/2024   MCV 87.3 05/26/2024   PLT 166 05/26/2024     STUDIES: CUP PACEART REMOTE DEVICE CHECK Result Date: 05/20/2024 S-ICD: Scheduled remote reviewed. Normal device function.  Presenting rhythm: VS Next remote transmission per protocol. ML, CVRSSensing Configuration: Secondary Gain Setting: 1X Post Shock Pacing: OFF   ASSESSMENT: Secondary polycythemia.  PLAN:    Secondary polycythemia: Previously, entire workup was negative for etiology including JAK2 with reflex and BCR-ABL mutations.  Patient is a non-smoker and does not take testosterone  supplementation.  Patient reports he has sleep apnea and has a CPAP machine, but does not tolerate it very well.  Patient's hematocrit today is 55.9 with a goal of less than 50.0.  Proceed with 500 mL phlebotomy today.  Return to clinic in 2 months with repeat laboratory, further evaluation, and consideration of additional phlebotomy. Renal insufficiency: Patient's most recent creatinine is reported 2.01. 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 Rotator cuff repair: Resolved.   Continue follow-up with  orthopedics as scheduled.   Sleep apnea: Continue follow-up with pulmonary as scheduled.   Patient expressed understanding and was in agreement with this plan. He also understands that He can call clinic at any time with any questions, concerns, or complaints.    Evalene JINNY Reusing, MD   05/26/2024 12:47 PM

## 2024-05-26 NOTE — Progress Notes (Signed)
 Leroi N Vallas Jr. presents today for phlebotomy per MD orders. Phlebotomy procedure started at 1035  and ended at 1046. 500 mls removed. Patient tolerated procedure well. IV needle removed intact.

## 2024-06-02 ENCOUNTER — Ambulatory Visit

## 2024-06-02 DIAGNOSIS — E0822 Diabetes mellitus due to underlying condition with diabetic chronic kidney disease: Secondary | ICD-10-CM | POA: Diagnosis not present

## 2024-06-02 DIAGNOSIS — N1832 Chronic kidney disease, stage 3b: Secondary | ICD-10-CM

## 2024-06-02 DIAGNOSIS — I5022 Chronic systolic (congestive) heart failure: Secondary | ICD-10-CM | POA: Diagnosis not present

## 2024-06-02 DIAGNOSIS — Z7985 Long-term (current) use of injectable non-insulin antidiabetic drugs: Secondary | ICD-10-CM

## 2024-06-02 DIAGNOSIS — Z7984 Long term (current) use of oral hypoglycemic drugs: Secondary | ICD-10-CM

## 2024-06-02 DIAGNOSIS — Z794 Long term (current) use of insulin: Secondary | ICD-10-CM | POA: Diagnosis not present

## 2024-06-02 MED ORDER — MOUNJARO 12.5 MG/0.5ML ~~LOC~~ SOAJ: 12.5000 mg | SUBCUTANEOUS | Status: DC

## 2024-06-02 MED ORDER — CARVEDILOL 3.125 MG PO TABS: 3.1250 mg | ORAL_TABLET | Freq: Two times a day (BID) | ORAL | 3 refills | Status: AC

## 2024-06-02 NOTE — Patient Instructions (Signed)
 Thanks for visiting with me today!  Decrease Novolog  to 20 units three times a day with meals If you fasting blood sugar is below 90 mg/dL for 2 days in a row, please decrease your Basaglar  by 2 units.   Sheila Gervasi E. Marsh, PharmD Clinical Pharmacist [facility] [phone]

## 2024-06-02 NOTE — Progress Notes (Signed)
 S:     Chief Complaint  Patient presents with   Medication Management    Diabetes    Reason for visit: ?  Gabriel [name]. is a 53 y.o. male with a history of diabetes (type 2), who presents today for a follow up diabetes Face to Face pharmacotherapy visit.? Pertinent PMH also includes CAD, NASH, CKD stage 3b, hx of PE, ICD, psoriatic arthritis, COPD, Afib, HFrEF.   Care Team: Primary Care Provider: [name] LABOR, FNP  At last visit with endocrinology on 05/06/24, patient was provided instructions for the titration of Mounjaro  every 4 weeks from 10 mg to 12.5 mg to 15 mg.   Current diabetes medications include: Mounjaro  10 mg weekly, Basaglar  50 units daily, Novolog  20 units TID with meals + 1 unit correction for every 50 mg/dL above 849 prior to meals (taking 22 units TID + correction), Farxiga  10 mg daily Previous diabetes medications include: Semglee , Lantus , Humalog , Victoza, metformin , Januvia, and Ozempic   Current hypertension medications include: carvedilol  3.125 mg BID, torsemide  40 mg daily, Furoscix  80 mg PRN, spironolactone  25 mg daily Current hyperlipidemia medications include: ezetimibe  10 mg daily, fenofibrate  145 mg daily, rosuvastatin  20 mg daily  Patient reports adherence to taking all medications as prescribed aside from Novolog  at a dose of 22 units with meals.  Have you been experiencing any side effects to the medications prescribed? no Do you have any problems obtaining medications due to transportation or finances? no Insurance coverage: [facility] Medicare/[place] Medicaid  Patient denies hypoglycemic events.  DM Prevention:  Statin: Taking; high intensity.?  History of chronic kidney disease? yes History of albuminuria? yes, last UACR on 04/02/24 = 43 mg/g Last eye exam:  Lab Results  Component Value Date   HMDIABEYEEXA No Retinopathy 05/04/2024   Tobacco Use:  Tobacco Use: Medium Risk (05/26/2024)   Patient History    Smoking Tobacco Use: Former     Smokeless Tobacco Use: Former    Passive Exposure: Not on file   O:  Lib[employer] Report    Vitals:  Wt Readings from Last 3 Encounters:  05/26/24 257 lb 9.6 oz (116.8 kg)  05/19/24 260 lb 2 oz (118 kg)  05/06/24 260 lb (117.9 kg)   BP Readings from Last 3 Encounters:  05/26/24 113/73  05/26/24 138/84  05/19/24 130/80   Pulse Readings from Last 3 Encounters:  05/26/24 87  05/26/24 87  05/19/24 85     Labs:?  Lab Results  Component Value Date   HGBA1C 8.8 (A) 04/02/2024   HGBA1C 6.9 (H) 01/01/2024   HGBA1C 6.6 (A) 07/11/2023   GLUCOSE 204 (H) 04/13/2024   MICRALBCREAT 43 (H) 04/02/2024   MICRALBCREAT 25 04/16/2023   MICRALBCREAT 49 (H) 02/08/2022   CREATININE 2.01 (H) 04/13/2024   CREATININE 2.16 (H) 04/06/2024   CREATININE 1.50 (H) 02/24/2024    Lab Results  Component Value Date   CHOL 180 01/01/2024   LDLCALC 94 01/01/2024   LDLCALC 54 12/05/2023   LDLCALC 62 08/09/2022   LDLDIRECT 60.6 11/23/2020   LDLDIRECT 65.3 01/18/2020   HDL 29 (L) 01/01/2024   TRIG 341 (H) 01/01/2024   TRIG 344 (H) 01/01/2024   TRIG 216 (H) 12/05/2023   ALT 26 04/06/2024   ALT 28 01/01/2024   AST 26 04/06/2024   AST 19 01/01/2024      Chemistry      Component Value Date/Time   NA 139 04/13/2024 0921   NA 137 04/06/2024 1224   NA 139  10/07/2013 1703   K 4.4 04/13/2024 0921   K 3.8 10/07/2013 1703   CL 101 04/13/2024 0921   CL 107 10/07/2013 1703   CO2 23 04/13/2024 0921   CO2 25 10/07/2013 1703   BUN 41 (H) 04/13/2024 0921   BUN 43 (H) 04/06/2024 1224   BUN 10 10/07/2013 1703   CREATININE 2.01 (H) 04/13/2024 0921   CREATININE 1.20 09/29/2023 1337   CREATININE 0.77 10/07/2013 1703   GLU 188 (H) 01/01/2024 0933      Component Value Date/Time   CALCIUM  9.9 04/13/2024 0921   CALCIUM  8.9 10/07/2013 1703   ALKPHOS 61 04/06/2024 1224   ALKPHOS 83 11/12/2012 1713   AST 26 04/06/2024 1224   AST 22 09/29/2023 1337   ALT 26 04/06/2024 1224   ALT 24 09/29/2023 1337   ALT  57 11/12/2012 1713   BILITOT 1.0 04/06/2024 1224   BILITOT 1.3 (H) 09/29/2023 1337       The ASCVD Risk score (Arnett DK, et al., 2019) failed to calculate for the following reasons:   Risk score cannot be calculated because patient has a medical history suggesting prior/existing ASCVD  Lab Results  Component Value Date   MICRALBCREAT 43 (H) 04/02/2024   MICRALBCREAT 25 04/16/2023   MICRALBCREAT 49 (H) 02/08/2022    A/P: Diabetes currently uncontrolled with a most recent A1c of 8.8% on 04/02/24, which is up from 6.9% on 01/01/24. CGM report shows significant improvement with a GMI of 7.1%. Patient was instructed to increase Mounjaro  dose next week. Concern for hypoglycemia with dose increase. Patient is able to verbalize appropriate hypoglycemia management plan. Medication adherence appears appropriate. Patient is hesitant to adjust insulin  doses with previous hyperglycemia. Will decrease bolus insulin  and provided instructions for down-titration of basal insulin .  -Continued basal insulin  Basaglar  (insulin  glargine)  50 units daily. Instructed to decrease by 2 units every 2 days for fasting BG <90 mg/dL -Decreased dose of rapid insulin  Novolog  (insulin  aspart) from 22 to 20 units TID with meals.  -Increased dose of GLP-1 Mounjaro  (tirzepatide )  to 12.5 mg weekly as previously instructed by endocrinology  -Continued SGLT2-I Farxiga  (dapagliflozin )10 mg daily.  -Patient educated on purpose, proper use, and potential adverse effects of insulin  therapy.  -Extensively discussed pathophysiology of diabetes, recommended lifestyle interventions, dietary effects on blood sugar control.  -Counseled on s/sx of and management of hypoglycemia.  -Next A1c anticipated 06/2024.   ASCVD risk - secondary prevention in patient with diabetes. Last LDL is 94 mg/dL, not at goal of <44 mg/dL.  -Continued rosuvastatin  20 mg daily.  -Continued ezetimibe  10 mg daily  -Continued fenofibrate  145 mg  daily  Hypertension longstanding currently controlled. Blood pressure goal of <130/80 mmHg. Medication adherence appropriate. Carvedilol  prescription was sent in as once vs twice daily. Will update prescription at this time. Patient has been taking correctly as twice daily -Continued carvedilol  3.125 mg BID. Will update prescription to reflect this -Continued torsemide  40 mg daily -Continued Furocix 80 mg prn -Continued spironolactone  25 mg daily  Written patient instructions provided. Patient verbalized understanding of treatment plan.  Total time in face to face counseling 45 minutes.     Follow-up:  Pharmacist on 08/04/24 HF cardiologist on 06/07/24 Endocrinology on 07/08/24  Peyton CHARLENA Ferries, PharmD Clinical Pharmacist [facility] [phone]

## 2024-06-07 ENCOUNTER — Encounter: Admitting: Internal Medicine

## 2024-06-10 ENCOUNTER — Encounter: Payer: Self-pay | Admitting: Oncology

## 2024-06-10 ENCOUNTER — Other Ambulatory Visit: Payer: Self-pay

## 2024-06-10 MED FILL — Continuous Glucose System Sensor: 30 days supply | Qty: 2 | Fill #4 | Status: AC

## 2024-06-14 ENCOUNTER — Other Ambulatory Visit: Payer: Self-pay | Admitting: Family Medicine

## 2024-06-14 DIAGNOSIS — N1831 Chronic kidney disease, stage 3a: Secondary | ICD-10-CM

## 2024-06-14 MED ORDER — INSULIN LISPRO (1 UNIT DIAL) 100 UNIT/ML (KWIKPEN): 20.0000 [IU] | PEN_INJECTOR | Freq: Three times a day (TID) | SUBCUTANEOUS | 11 refills | Status: AC

## 2024-06-14 MED ORDER — INSULIN GLARGINE 100 UNIT/ML SOLOSTAR PEN
50.0000 [IU] | PEN_INJECTOR | Freq: Every day | SUBCUTANEOUS | 11 refills | Status: DC
Start: 2024-06-14 — End: 2024-06-22

## 2024-06-15 DIAGNOSIS — J300 Vasomotor rhinitis: Secondary | ICD-10-CM | POA: Diagnosis not present

## 2024-06-16 ENCOUNTER — Ambulatory Visit: Admitting: Dietician

## 2024-06-16 ENCOUNTER — Telehealth: Payer: Self-pay | Admitting: Family

## 2024-06-16 NOTE — Telephone Encounter (Signed)
 Called to confirm/remind patient of their appointment [facility] on 06/17/[facility].   Appointment:   [x] Confirmed  [] Left mess   [] No answer/No voice mail  [] VM Full/unable to leave message  [] Phone not in service  Patient reminded to bring all medications and/or complete list.  Confirmed patient has transportation. Gave directions, instructed to utilize valet parking.

## 2024-06-17 ENCOUNTER — Other Ambulatory Visit: Payer: Self-pay

## 2024-06-17 ENCOUNTER — Encounter: Payer: Self-pay | Admitting: Family

## 2024-06-17 ENCOUNTER — Telehealth: Payer: Self-pay | Admitting: Family Medicine

## 2024-06-17 ENCOUNTER — Other Ambulatory Visit
Admission: RE | Admit: 2024-06-17 | Discharge: 2024-06-17 | Disposition: A | Discharge status: Home / Self Care | Source: Ambulatory Visit | Attending: Family | Admitting: Family

## 2024-06-17 ENCOUNTER — Ambulatory Visit: Payer: Self-pay | Admitting: Family

## 2024-06-17 ENCOUNTER — Encounter: Payer: Self-pay | Admitting: Oncology

## 2024-06-17 ENCOUNTER — Ambulatory Visit: Admitting: Family

## 2024-06-17 ENCOUNTER — Telehealth: Payer: Self-pay

## 2024-06-17 VITALS — BP 134/89 | HR 86 | Wt 260.0 lb

## 2024-06-17 DIAGNOSIS — I25118 Atherosclerotic heart disease of native coronary artery with other forms of angina pectoris: Secondary | ICD-10-CM | POA: Diagnosis not present

## 2024-06-17 DIAGNOSIS — G4733 Obstructive sleep apnea (adult) (pediatric): Secondary | ICD-10-CM

## 2024-06-17 DIAGNOSIS — N1832 Chronic kidney disease, stage 3b: Secondary | ICD-10-CM

## 2024-06-17 DIAGNOSIS — Z794 Long term (current) use of insulin: Secondary | ICD-10-CM

## 2024-06-17 DIAGNOSIS — E1122 Type 2 diabetes mellitus with diabetic chronic kidney disease: Secondary | ICD-10-CM

## 2024-06-17 DIAGNOSIS — I10 Essential (primary) hypertension: Secondary | ICD-10-CM | POA: Diagnosis not present

## 2024-06-17 DIAGNOSIS — I480 Paroxysmal atrial fibrillation: Secondary | ICD-10-CM

## 2024-06-17 DIAGNOSIS — I2699 Other pulmonary embolism without acute cor pulmonale: Secondary | ICD-10-CM | POA: Diagnosis not present

## 2024-06-17 DIAGNOSIS — I5022 Chronic systolic (congestive) heart failure: Secondary | ICD-10-CM | POA: Insufficient documentation

## 2024-06-17 DIAGNOSIS — K219 Gastro-esophageal reflux disease without esophagitis: Secondary | ICD-10-CM

## 2024-06-17 LAB — CBC
HCT: 51.7 % (ref 39.0–52.0)
Hemoglobin: 16.8 g/dL (ref 13.0–17.0)
MCH: 27.8 pg (ref 26.0–34.0)
MCHC: 32.5 g/dL (ref 30.0–36.0)
MCV: 85.6 fL (ref 80.0–100.0)
Platelets: 227 K/uL (ref 150–400)
RBC: 6.04 MIL/uL — ABNORMAL HIGH (ref 4.22–5.81)
RDW: 13.3 % (ref 11.5–15.5)
WBC: 7.5 K/uL (ref 4.0–10.5)
nRBC: 0 % (ref 0.0–0.2)

## 2024-06-17 LAB — BASIC METABOLIC PANEL WITH GFR
Anion gap: 14 (ref 5–15)
BUN: 36 mg/dL — ABNORMAL HIGH (ref 6–20)
CO2: 26 mmol/L (ref 22–32)
Calcium: 9.3 mg/dL (ref 8.9–10.3)
Chloride: 100 mmol/L (ref 98–111)
Creatinine, Ser: 1.87 mg/dL — ABNORMAL HIGH (ref 0.61–1.24)
GFR, Estimated: 42 mL/min — ABNORMAL LOW (ref >60)
Glucose, Bld: 161 mg/dL — ABNORMAL HIGH (ref 70–99)
Potassium: 3.9 mmol/L (ref 3.5–5.1)
Sodium: 140 mmol/L (ref 135–145)

## 2024-06-17 LAB — BRAIN NATRIURETIC PEPTIDE: B Natriuretic Peptide: 60.7 pg/mL (ref 0.0–100.0)

## 2024-06-17 MED ORDER — METOLAZONE 2.5 MG PO TABS: 2.5000 mg | ORAL_TABLET | Freq: Every day | ORAL | 0 refills | Status: DC

## 2024-06-17 MED ORDER — OMEPRAZOLE 40 MG PO CPDR: 40.0000 mg | DELAYED_RELEASE_CAPSULE | Freq: Every day | ORAL | 2 refills | Status: AC

## 2024-06-17 NOTE — Progress Notes (Signed)
 Advanced Heart Failure Clinic Note    PCP: Ostwalt, Janna, PA  Primary Cardiologist: Gollan, Timothy, MD  HF cardiologist: Cherrie Sieving, MD   Chief Complaint: shortness of breath   HPI:  Gabriel [name] is a 53 y/o male with a history of atrial fibrillation (03/25), HTN, T2DM, chronic chest pain, chronic troponin elevation, COPD, CKD, asthma, NSVT, OSA, unprovoked PE, pulmonary HTN, psoriasis, migraines, cirrhosis from NASH, ICD placement (10/23), previous tobacco use and chronic heart failure. Had left shoulder arthroscopy with debridement, decompression, rotator cuff repair and biceps tenodesis done 09/16/23.   RHC/LHC done 06/13/20 and showed: Severe single-vessel coronary artery disease with chronic total occlusion of the proximal RCA.  The distal vessel fills via left to right collaterals. Mild to moderate, nonobstructive CAD involving the LAD and LCx with up to 50% stenosis. Mildly elevated left and right heart filling pressures. Moderate pulmonary hypertension (mean PAP 40 mmHg) with significantly elevated pulmonary vascular resistance (PVR 7.4 WU). Severely reduced Fick cardiac output/index.  Echo 05/21/21: EF of 40-45% along with mild LVH and mild Gabriel.   Admitted 09/2021 & 11/2021 with HF.   Echo 11/24/21: EF of 20-25% along with mild LVH and mild Gabriel.  Echo 02/06/22: EF of 30-35% along with mild/moderate AR.   Admitted 05/20/22 for ICD placement and discharged the next day. Was in the ED 12/21/22 due to dizziness & chest pain.   Echo 08/14/22: EF 40-45% with mild LVH, Grade I DD, severe hypokinesis of left ventricular, entire inferior and inferolateral wall.  Echo 03/08/23: EF 45-50% with moderate LVH, trivial Gabriel, mild dilatation of aortic root at 43 mm  Was in the ED 06/09/23 due to SOB/ chest pain for previous 2 days. CXR concerning for pneumonia so antibiotics provided. Initial troponin in the 50's which is close to his baseline. EKG without ischemic changes.   RHC/ LHC  08/19/23:  Relatively stable appearance of the coronary arteries since last catheterization in 2021.  Chronically occluded proximal RCA now has antegrade flow, though I suspect this represents bridging collaterals.  Left-to-right collateral remain as well as moderate LAD and LCx disease. Mildly elevated left heart filling pressures (LVEDP 20 mmHg, PCWP 18 mmHg). Moderate pulmonary hypertension (mean PA 41 mmHg, PVR 5.3 WU). Moderately reduced Fick cardiac output/index (CO 4.3 L/min, CI 1.9 L/min/m^2).  Admitted 10/22/23 due to COPD exacerbation secondary to influenza.   Admitted 11/03/23 with dizziness, tachycardia and new onset AF. Echo 3/18 -- EF 35-40%, global LV hypokinesis, severely dilated LV internal cavity size, mild LVH, small pericardial effusion, mild Gabriel. IV diuresed, GDMT restarted. Successful cardioversion 11/06/23. Amiodarone  loaded.   Was seen in the HF clinic 12/05/23 where torsemide  was increased to 40mg  daily X 3 days and then decreased back to 20mg  daily.   Was seen in the HF clinic 12/10/23 & was sent for IV lasix  due to being fluid up. Zio monitor placed due to increased palpitations.   Echo 01/02/24: EF 35-40%, mild LVH, low normal RV, mild LAE, mild/ moderate AR, mild dilatation of the aortic root, measuring 40 mm   Admitted 02/06/24 with SOB and hypoxia for several days prior to admission. CXR showed edema, no effusions. ECG NSR. BNP 600. IV diuresed with transition to oral diuretics.    He presents today for a HF follow-up visit with a chief complaint of moderate shortness of breath with little exertion. Has associated fatigue, stable chest pain, occasional palpitations, occasional dizziness, abdominal distention. Sleeping ok. Feels like he's SOB is worsening where it's taking  less and less to get SOB. Was a little SOB upon walking into the office today. Has used 4 furoscix  in the last month due to weight gain and worsening abdominal distention. He has also increased his daily  torsemide  to 80mg . Has not had much relief from furoscix  nor increasing her torsemide  to 80mg  daily.   ROS: All systems negative except as listed in HPI, PMH and Problem List.  SH:  Social History   Socioeconomic History   Marital status: Widowed    Spouse name: Not on file   Number of children: 1   Years of education: 28   Highest education level: 12th grade  Occupational History   Occupation: disabled  Tobacco Use   Smoking status: Former    Current packs/day: 0.00    Average packs/day: 0.5 packs/day for 33.0 years (16.5 ttl pk-yrs)    Types: Cigarettes    Start date: 05/09/1987    Quit date: 05/08/2020    Years since quitting: 4.1   Smokeless tobacco: Former    Quit date: 05/08/2020   Tobacco comments:    Quit Sept 2021  Vaping Use   Vaping status: Former  Substance and Sexual Activity   Alcohol use: Not Currently   Drug use: No   Sexual activity: Not Currently  Other Topics Concern   Not on file  Social History Narrative   Lives at home with wife and his dad's wife.        Works sales/advanced research scientist (life sciences); smoker; cutting; hx of alcoholism [started 13 year]; quit at age of 22 years.    Social Drivers of Health   Financial Resource Strain: Medium Risk (05/06/2024)   Received from [facility]   Overall Financial Resource Strain (CARDIA)    Difficulty of Paying Living Expenses: Somewhat hard  Food Insecurity: No Food Insecurity (05/06/2024)   Received from [facility]   Hunger Vital Sign    Within the past 12 months, you worried that your food would run out before you got the money to buy more.: Never true    Within the past 12 months, the food you bought just didn't last and you didn't have money to get more.: Never true  Transportation Needs: No Transportation Needs (05/06/2024)   Received from [facility] - Transportation    In the past 12 months, has lack of transportation kept you from medical appointments  or from getting medications?: No    Lack of Transportation (Non-Medical): No  Physical Activity: Insufficiently Active (01/20/2018)   Exercise Vital Sign    Days of Exercise per Week: 7 days    Minutes of Exercise per Session: 10 min  Stress: Stress Concern Present (09/02/2017)   [name] - Occupational Stress Questionnaire    Feeling of Stress : Very much  Social Connections: Unknown (10/22/2023)   Social Connection and Isolation Panel    Frequency of Communication with Friends and Family: Not on file    Frequency of Social Gatherings with Friends and Family: Patient declined    Attends Religious Services: Patient declined    Active Member of Clubs or Organizations: Patient declined    Attends Banker Meetings: Patient declined    Marital Status: Patient declined  Intimate Partner Violence: Not At Risk (05/06/2024)   Humiliation, Afraid, Rape, and Kick questionnaire    Fear of Current or Ex-Partner: No    Emotionally Abused: No    Physically Abused: No  Sexually Abused: No    FH:  Family History  Problem Relation Age of Onset   Diabetes Mother    Diabetes Mellitus II Mother    Hypothyroidism Mother    Hypertension Mother    Kidney failure Mother        Dialysis   Heart attack Mother        59 yo approximately   Hypertension Father    Gout Father    Cancer Maternal Grandfather    Diabetes Maternal Grandfather    Cancer Paternal Aunt     Past Medical History:  Diagnosis Date   Anginal pain    Aortic atherosclerosis    Aortic root dilation 11/28/2016   a.) TTE 11/28/2016: 43 mm; b.) TTE 11/23/2019: 38 mm; c.) TTE 08/14/2022: 40 mm; d.) TTE 03/08/2023: 43 mm; e.) TTE 01/02/2024: 40 mm   Arteriosclerosis of right carotid artery    a.) carotid doppler 04/29/2017: <50% RICA   Atrial fibrillation with RVR (HCC)    a.) CHA2DS2VASc = 6 (HFrEF, HTN, CVA x 2, vascular disease, T2DM) as of 01/27/2024; b.) s/p DCCV 11/06/2023 --> 200 J x  1 -->  NSR; c.) rate/rhythm maintained on oral amiodarone  + carvedilol ; chronically anticoagulated with rivaroxaban    CAD (coronary artery disease)    a.) MV 04/2015: low risk; b.) LHC 01/06/2017: minor irregs in LAD/Diag/LCX/OM, RCA 40p/m/d; c.) R/LHC 06/13/2020: 50% dLAD, 30% p-mLCx, 40% OM1, 100% pRCA (L-R collats) - med mgmt; d.) R/LHC 08/19/2023: 50% dLAD, 30% p-mLCx, 40% OM1, 100% pRCA (L-R collats), 50% p-mRCA - med mgmt   Cardiomegaly    Cerebellar infarct (HCC)    a.) brain MRI 06/23/2023: tiny chronic infarct within the RIGHT cerebellar hemisphere; unsure of timing of neurological event; no deficits   Chest wall pain, chronic    Chronic 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    Chronic Troponin Elevation    CKD (chronic kidney disease), stage II-III    COPD (chronic obstructive pulmonary disease) (HCC)    CPAP (continuous positive airway pressure) dependence 03/08/2023   Depression    DM (diabetes mellitus), type 2 (HCC)    Dyspnea    GERD (gastroesophageal reflux disease)    Headache    HFrEF (heart failure with reduced ejection fraction) (HCC)    History of 2019 novel coronavirus disease (COVID-19) 03/01/2022   History of kidney stones    History of methicillin resistant staphylococcus aureus (MRSA)    Hyperlipidemia    Hypertension    ICD (implantable cardioverter-defibrillator) in place 05/20/2022   a.) LEFT midaxillary [employer] Emblem MRI S-ICD (SN: [phone])   Left rotator cuff tear    Liver cirrhosis secondary to NASH (HCC)    Long term current use of amiodarone     Long term current use of immunosuppressive drug    a.) ixekizumab for psoriasis/psoriatic arthritis diagnosis   Long-term use of aspirin  therapy    Mediastinal lymphadenopathy    Mitral regurgitation    Mild to moderate by October 2021 echocardiogram.   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 12/2015 noted on tele-->amio;  b. 12/2015 Event monitor: no VT noted.   Obesity (BMI 30.0-34.9)    On rivaroxaban  therapy    OSA treated  with BiPAP    Pneumonia    Polyp of descending colon    Psoriasis    Psoriatic arthritis (HCC)    a.) Tx'd with ixekizumab   Pulmonary HTN (HCC) 02/29/2016   a.) TTE 02/29/2016: RVSP 47; b.) TTE 06/26/2019: RVSP 58.4; c.) TTE 12/08/2019: RVSP 40.8; d.) TTE 06/08/2020: RVSP 44.7; e.) R/LHC 06/08/2020: mRA 9, mPA 40, mPWCP 17, AO sat 98, PA sat 61, CO 3.1, CI 1.3, PVR 7.4, LVEDP 20; f.) R/LHC 08/18/2020: mRA 5, mPA 41, mPCWP 18, AO sat 92, PA sat 59, CO 4.3, CI 1.9, PVR 5.3   Recurrent pulmonary emboli (HCC) 06/07/2020   06/07/20: small bilateral PEs.  12/31/19: RUL and RLL PEs.   Secondary polycythemia    a.) negative for JAK2 with reflex and BCR-ABL mutations; seeing hematology with etiology felt to be secondary to underlying COPD (quit smoking 04/2020), heart disease, OSAH, and obesity   Stroke [facility])    Syncope    a. 01/2016 - felt to be vasovagal.   Thrombocytopenia     Current Outpatient Medications  Medication Sig Dispense Refill   Accu-Chek Softclix Lancets lancets USE UP TO FOUR TIMES DAILY     albuterol  (VENTOLIN  HFA) 108 (90 Base) MCG/ACT inhaler Inhale 2 puffs into the lungs every 4 (four) hours as needed for wheezing or shortness of breath. 8 g 6   allopurinol  (ZYLOPRIM ) 300 MG tablet Take 300 mg by mouth every morning.     amiodarone  (PACERONE ) 200 MG tablet Take 1 tablet (200 mg total) by mouth daily. 90 tablet 3   aspirin  EC 81 MG tablet Take 81 mg by mouth every morning.     BD PEN NEEDLE NANO 2ND GEN 32G X 4 MM MISC USE  AS DIRECTED AT BEDTIME 200 each 0   blood glucose meter kit and supplies KIT Dispense based on patient and insurance preference. Use  up to four times daily as directed. 1 each 11   butalbital -acetaminophen -caffeine  (FIORICET ) 50-325-40 MG tablet Take 1 tablet by mouth every 4 (four) hours as needed for migraine.     carvedilol  (COREG ) 3.125 MG tablet Take 1 tablet (3.125 mg total) by mouth 2 (two) times daily with a meal. 60 tablet 3   cetirizine  (ZYRTEC ) 10 MG tablet Take 1 tablet (10 mg total) by mouth daily as needed for allergies. (Patient not taking: Reported on 06/02/2024) 30 tablet 2   ciclopirox  (PENLAC ) 8 % solution Apply topically at bedtime. Apply over nail and surrounding skin. Apply daily over previous coat. After seven (7) days, may remove with alcohol and continue cycle. 6.6 mL 5   Continuous Glucose Sensor (FREESTYLE LIBRE 3 PLUS SENSOR) MISC Place 1 sensor on the skin every 15 days. Use to check glucose continuously 2 each 12   dapagliflozin  propanediol (FARXIGA ) 10 MG TABS tablet Take 1 tablet (10 mg total) by mouth daily. 90 tablet 3   ezetimibe  (ZETIA ) 10 MG tablet Take 1 tablet (10 mg total) by mouth daily. 90 tablet 3   fenofibrate  (TRICOR ) 145 MG tablet Take 1 tablet (145 mg total) by mouth every evening. 90 tablet 3   Furosemide  (FUROSCIX ) 80 MG/10ML CTKT Inject 80 mg into the skin daily as needed. For weight above 250lbs from edema 10 each 2   insulin  glargine (LANTUS ) 100 UNIT/ML Solostar Pen Inject 50 Units into the skin at bedtime. Make continue to decrease by 2 units every 2 days for a goal of fasting blood glucose 80-89 15 mL 11  insulin  lispro (HUMALOG ) 100 UNIT/ML KwikPen Inject 20 Units into the skin 3 (three) times daily. + CORRECTION BEFORE BREAKFAST/LUNCH AND SUPPER (CORRECTION = 1 UNIT PER 50 MG/DL ABOVE 849); MAX DAILY DOSE 77 UNITS 15 mL 11   Insulin  Pen Needle 32G X 4 MM MISC USE WITH INSULIN  IN THE MORNING, AT NOON, IN THE EVENING AND AT BEDTIME 400 each 11   ipratropium-albuterol  (DUONEB) 0.5-2.5 (3) MG/3ML SOLN Take 3 mLs by nebulization every 6 (six) hours as needed. 360 mL 11   meclizine   (ANTIVERT ) 25 MG tablet Take 0.5 tablets (12.5 mg total) by mouth 3 (three) times daily as needed for dizziness.     nitroGLYCERIN  (NITROSTAT ) 0.4 MG SL tablet Place 1 tablet (0.4 mg total) under the tongue every 5 (five) minutes x 3 doses as needed for chest pain. 30 tablet 3   omeprazole  (PRILOSEC) 40 MG capsule Take 1 capsule (40 mg total) by mouth daily. 30 capsule 3   potassium chloride  SA (KLOR-CON  M) 20 MEQ tablet Take 1 tablet (20 mEq total) by mouth every other day. AM 45 tablet 3   rivaroxaban  (XARELTO ) 20 MG TABS tablet Take 1 tablet (20 mg total) by mouth daily with supper. 90 tablet 3   rosuvastatin  (CRESTOR ) 20 MG tablet Take 1 tablet (20 mg total) by mouth daily. 90 tablet 3   sacubitril -valsartan  (ENTRESTO ) 24-26 MG Take 1 tablet by mouth 2 (two) times daily. 180 tablet 1   spironolactone  (ALDACTONE ) 25 MG tablet Take 25 mg by mouth daily.     TALTZ 80 MG/ML SOAJ Inject 3 mLs into the skin every 28 (twenty-eight) days. (Patient not taking: Reported on 06/02/2024)     tirzepatide  (MOUNJARO ) 12.5 MG/0.5ML Pen Inject 12.5 mg into the skin once a week.     Torsemide  40 MG TABS Take 40 mg by mouth daily. 90 tablet 3   No current facility-administered medications for this visit.   Vitals:   06/17/24 0841  BP: 134/89  Pulse: 86  SpO2: 93%  Weight: 260 lb (117.9 kg)   Wt Readings from Last 3 Encounters:  06/17/24 260 lb (117.9 kg)  05/26/24 257 lb 9.6 oz (116.8 kg)  05/19/24 260 lb 2 oz (118 kg)   Lab Results  Component Value Date   CREATININE 2.01 (H) 04/13/2024   CREATININE 2.16 (H) 04/06/2024   CREATININE 1.50 (H) 02/24/2024    PHYSICAL EXAM:  General: Well appearing.  Cor: No JVD. Regular rhythm, rate.  Lungs: clear Abdomen: soft, nontender, distended. Extremities: no edema Neuro:. Affect pleasant   ECG: not done   ASSESSMENT & PLAN:  1.  Chronic HFrEF/Mixed Ischemic and nonischemic cardiomyopathy:   - Mixed ICM/NICM - NYHA [name] III  - fluid up with  weight gain and worsening symptoms. Has used 4 furoscix  in last month along with self-titration of torsemide  without relief - Begin metolazone  2.5mg  daily X 3 doses. BMET today and will let him know if additional potassium supplement is needed.  - s/p [employer] Sq-ICD; denies any shocks - weight up 8 pounds from last visit here 2 months ago - Echo 05/21/21: EF of 40-45% along with mild LVH and mild Gabriel.  - Echo 11/24/21: EF of 20-25% along with mild LVH and mild Gabriel.  - Echo 02/06/22: EF of 30-35% along with mild/moderate AR.  - Echo 08/14/22: EF 40-45% with mild LVH, Grade I DD, severe hypokinesis of left ventricular, entire inferior and inferolateral wall.  - Echo 03/08/23: EF 45-50% with moderate  LVH, trivial Gabriel, mild dilatation of aortic root at 43 mm - Echo 10/1823: EF 35-40%, global LV hypokinesis, severely dilated LV internal cavity size, mild LVH, small pericardial effusion, mild Gabriel. - Echo 01/02/24: EF 35-40%, mild LVH, low normal RV, mild LAE, mild/ moderate AR, mild dilatation of the aortic root, measuring 40 mm  - will schedule RHC to evaluate pressures/ fluid status . CBC, BMET today - continue carvedilol  3.125mg  BID - continue Farxiga  10mg  daily - continue Entresto  24/26mg  bid. He does not want to come off entresto  if BP declines - continue spiro 25mg  daily - continue torsemide  80mg  daily.  - continue potassium 20meq every other day - continue to use Furoscix  if weight >= 250 - consider Cardiomems to help volume management as needed => his current insurance won't cover. Can revisit this after he gets new insurance 10/25 - saw ADHF provider (Bensimhon) 09/24 - BNP 04/06/24 reviewed and was 25.8. BNP today   2.  CAD/Elevated Troponin and chronic CP:  - H/o CAD  - RHC/ LHC 08/19/23:     1. Relatively stable appearance of the coronary arteries since last catheterization in 2021.  Chronically occluded proximal RCA now has antegrade flow, though I suspect this represents bridging  collaterals.  Left-to-right collateral remain as well as moderate LAD and LCx disease.    2. Mildly elevated left heart filling pressures (LVEDP 20 mmHg, PCWP 18 mmHg).    3. Moderate pulmonary hypertension (mean PA 41 mmHg, PVR 5.3 WU).    4. Moderately reduced Fick cardiac output/index (CO 4.3 L/min, CI 1.9 L/min/m^2). - continue rosuvastatin  20mg  daily - saw cardiology Florestine) 07/25 - lipid panel 01/01/24: LDL 94, triglycerides 341 - insurance is currently not covering repatha  injection   3.  HTN:  - BP 134/89 - saw PCP Mazie) 09/25 - BMP 04/13/24 reviewed: sodium 139, potassium 4.4, creatinine 2.01 & GFR 38 - BMET today   4.  H/o PE/Chronic anticoagulation:  - continue xarelto  20mg  daily - no recent bleeding   5.  DM2- - saw nephrology (Korrapati) 08/23  - A1c 04/02/24 reviewed and was 8.8% - saw endocrinology Art) 09/25   6. OSA- - Sleep study 3/22: AHI 28  - sleep study 12/23; AHI 45.1 - sleep study 11/24 AHI 70.0 - wearing CPAP nightly unless feeling very congested - saw pulmonology Jodeen) 11/24  7: AF- - saw EP Marny) 10/25 - cardioverted 11/06/23. Plan for possible ablation in the future as previous ablation was cancelled due to clinical instability.  - continue amiodarone  400mg  BID taper - continue ASA 81mg  daily - continue xarelto  20mg  daily   Return in 1 week after RHC, sooner if needed.   I spent 30 minutes reviewing records, interviewing/ examing patient and managing plan/ orders.    Gabriel DELENA Class, FNP 06/17/24

## 2024-06-17 NOTE — Telephone Encounter (Signed)
[facility] faxed refill request for the following medications:   omeprazole (PRILOSEC) 40 MG capsule    Please advise.  

## 2024-06-17 NOTE — Telephone Encounter (Signed)
 Medication sent into pharmacy

## 2024-06-17 NOTE — Patient Instructions (Signed)
 Visit Information  Thank you for taking time to visit with me today. Please don't hesitate to contact me if I can be of assistance to you before our next scheduled appointment.  Your next care management appointment is no further scheduled appointments.    Closing From: Complex Care Management.  Please call the care guide team at [phone] if you need to cancel, schedule, or reschedule an appointment.   Please call the Suicide and Crisis Lifeline: 988 call the [place]  National Suicide Prevention Lifeline: 508 073 6807 or TTY: (915)023-5940 TTY 4407023144) to talk to a trained counselor call 1-800-273-TALK (toll free, 24 hour hotline) go to [facility] [address] [place] [phone]) call 911 if you are experiencing a Mental Health or Behavioral Health Crisis or need someone to talk to.   Nestora Duos, MSN, RN [facility], [facility] RN Care Manager Direct Dial: [phone] Fax: [phone]

## 2024-06-17 NOTE — H&P (View-Only) (Signed)
 [facility] Note    PCP: Ostwalt, Janna, PA  Primary Cardiologist: Gollan, Timothy, MD  HF cardiologist: Cherrie Sieving, MD   Chief Complaint: shortness of breath   HPI:  Gabriel [name] is a 53 y/o male with a history of atrial fibrillation (03/25), HTN, T2DM, chronic chest pain, chronic troponin elevation, COPD, CKD, asthma, NSVT, OSA, unprovoked PE, pulmonary HTN, psoriasis, migraines, cirrhosis from NASH, ICD placement (10/23), previous tobacco use and chronic heart failure. Had left shoulder arthroscopy with debridement, decompression, rotator cuff repair and biceps tenodesis done 09/16/23.   RHC/LHC done 06/13/20 and showed: Severe single-vessel coronary artery disease with chronic total occlusion of the proximal RCA.  The distal vessel fills via left to right collaterals. Mild to moderate, nonobstructive CAD involving the LAD and LCx with up to 50% stenosis. Mildly elevated left and right heart filling pressures. Moderate pulmonary hypertension (mean PAP 40 mmHg) with significantly elevated pulmonary vascular resistance (PVR 7.4 WU). Severely reduced Fick cardiac output/index.  Echo 05/21/21: EF of 40-45% along with mild LVH and mild Gabriel.   Admitted 09/2021 & 11/2021 with HF.   Echo 11/24/21: EF of 20-25% along with mild LVH and mild Gabriel.  Echo 02/06/22: EF of 30-35% along with mild/moderate AR.   Admitted 05/20/22 for ICD placement and discharged the next day. Was in the ED 12/21/22 due to dizziness & chest pain.   Echo 08/14/22: EF 40-45% with mild LVH, Grade I DD, severe hypokinesis of left ventricular, entire inferior and inferolateral wall.  Echo 03/08/23: EF 45-50% with moderate LVH, trivial Gabriel, mild dilatation of aortic root at 43 mm  Was in the ED 06/09/23 due to SOB/ chest pain for previous 2 days. CXR concerning for pneumonia so antibiotics provided. Initial troponin in the 50's which is close to his baseline. EKG without ischemic changes.   RHC/ LHC  08/19/23:  Relatively stable appearance of the coronary arteries since last catheterization in 2021.  Chronically occluded proximal RCA now has antegrade flow, though I suspect this represents bridging collaterals.  Left-to-right collateral remain as well as moderate LAD and LCx disease. Mildly elevated left heart filling pressures (LVEDP 20 mmHg, PCWP 18 mmHg). Moderate pulmonary hypertension (mean PA 41 mmHg, PVR 5.3 WU). Moderately reduced Fick cardiac output/index (CO 4.3 L/min, CI 1.9 L/min/m^2).  Admitted 10/22/23 due to COPD exacerbation secondary to influenza.   Admitted 11/03/23 with dizziness, tachycardia and new onset AF. Echo 3/18 -- EF 35-40%, global LV hypokinesis, severely dilated LV internal cavity size, mild LVH, small pericardial effusion, mild Gabriel. IV diuresed, GDMT restarted. Successful cardioversion 11/06/23. Amiodarone  loaded.   Was seen in the HF clinic 12/05/23 where torsemide  was increased to 40mg  daily X 3 days and then decreased back to 20mg  daily.   Was seen in the HF clinic 12/10/23 & was sent for IV lasix  due to being fluid up. Zio monitor placed due to increased palpitations.   Echo 01/02/24: EF 35-40%, mild LVH, low normal RV, mild LAE, mild/ moderate AR, mild dilatation of the aortic root, measuring 40 mm   Admitted 02/06/24 with SOB and hypoxia for several days prior to admission. CXR showed edema, no effusions. ECG NSR. BNP 600. IV diuresed with transition to oral diuretics.    He presents today for a HF follow-up visit with a chief complaint of moderate shortness of breath with little exertion. Has associated fatigue, stable chest pain, occasional palpitations, occasional dizziness, abdominal distention. Sleeping ok. Feels like he's SOB is worsening where it's taking  less and less to get SOB. Was a little SOB upon walking into the office today. Has used 4 furoscix  in the last month due to weight gain and worsening abdominal distention. He has also increased his daily  torsemide  to 80mg . Has not had much relief from furoscix  nor increasing her torsemide  to 80mg  daily.   ROS: All systems negative except as listed in HPI, PMH and Problem List.  SH:  Social History   Socioeconomic History   Marital status: Widowed    Spouse name: Not on file   Number of children: 1   Years of education: 28   Highest education level: 12th grade  Occupational History   Occupation: disabled  Tobacco Use   Smoking status: Former    Current packs/day: 0.00    Average packs/day: 0.5 packs/day for 33.0 years (16.5 ttl pk-yrs)    Types: Cigarettes    Start date: 05/09/1987    Quit date: 05/08/2020    Years since quitting: 4.1   Smokeless tobacco: Former    Quit date: 05/08/2020   Tobacco comments:    Quit Sept 2021  Vaping Use   Vaping status: Former  Substance and Sexual Activity   Alcohol use: Not Currently   Drug use: No   Sexual activity: Not Currently  Other Topics Concern   Not on file  Social History Narrative   Lives at home with wife and his dad's wife.        Works sales/advanced research scientist (life sciences); smoker; cutting; hx of alcoholism [started 13 year]; quit at age of 22 years.    Social Drivers of Health   Financial Resource Strain: Medium Risk (05/06/2024)   Received from [facility]   Overall Financial Resource Strain (CARDIA)    Difficulty of Paying Living Expenses: Somewhat hard  Food Insecurity: No Food Insecurity (05/06/2024)   Received from [facility]   Hunger Vital Sign    Within the past 12 months, you worried that your food would run out before you got the money to buy more.: Never true    Within the past 12 months, the food you bought just didn't last and you didn't have money to get more.: Never true  Transportation Needs: No Transportation Needs (05/06/2024)   Received from [facility] - Transportation    In the past 12 months, has lack of transportation kept you from medical appointments  or from getting medications?: No    Lack of Transportation (Non-Medical): No  Physical Activity: Insufficiently Active (01/20/2018)   Exercise Vital Sign    Days of Exercise per Week: 7 days    Minutes of Exercise per Session: 10 min  Stress: Stress Concern Present (09/02/2017)   [name] - Occupational Stress Questionnaire    Feeling of Stress : Very much  Social Connections: Unknown (10/22/2023)   Social Connection and Isolation Panel    Frequency of Communication with Friends and Family: Not on file    Frequency of Social Gatherings with Friends and Family: Patient declined    Attends Religious Services: Patient declined    Active Member of Clubs or Organizations: Patient declined    Attends Banker Meetings: Patient declined    Marital Status: Patient declined  Intimate Partner Violence: Not At Risk (05/06/2024)   Humiliation, Afraid, Rape, and Kick questionnaire    Fear of Current or Ex-Partner: No    Emotionally Abused: No    Physically Abused: No  Sexually Abused: No    FH:  Family History  Problem Relation Age of Onset   Diabetes Mother    Diabetes Mellitus II Mother    Hypothyroidism Mother    Hypertension Mother    Kidney failure Mother        Dialysis   Heart attack Mother        59 yo approximately   Hypertension Father    Gout Father    Cancer Maternal Grandfather    Diabetes Maternal Grandfather    Cancer Paternal Aunt     Past Medical History:  Diagnosis Date   Anginal pain    Aortic atherosclerosis    Aortic root dilation 11/28/2016   a.) TTE 11/28/2016: 43 mm; b.) TTE 11/23/2019: 38 mm; c.) TTE 08/14/2022: 40 mm; d.) TTE 03/08/2023: 43 mm; e.) TTE 01/02/2024: 40 mm   Arteriosclerosis of right carotid artery    a.) carotid doppler 04/29/2017: <50% RICA   Atrial fibrillation with RVR (HCC)    a.) CHA2DS2VASc = 6 (HFrEF, HTN, CVA x 2, vascular disease, T2DM) as of 01/27/2024; b.) s/p DCCV 11/06/2023 --> 200 J x  1 -->  NSR; c.) rate/rhythm maintained on oral amiodarone  + carvedilol ; chronically anticoagulated with rivaroxaban    CAD (coronary artery disease)    a.) MV 04/2015: low risk; b.) LHC 01/06/2017: minor irregs in LAD/Diag/LCX/OM, RCA 40p/m/d; c.) R/LHC 06/13/2020: 50% dLAD, 30% p-mLCx, 40% OM1, 100% pRCA (L-R collats) - med mgmt; d.) R/LHC 08/19/2023: 50% dLAD, 30% p-mLCx, 40% OM1, 100% pRCA (L-R collats), 50% p-mRCA - med mgmt   Cardiomegaly    Cerebellar infarct (HCC)    a.) brain MRI 06/23/2023: tiny chronic infarct within the RIGHT cerebellar hemisphere; unsure of timing of neurological event; no deficits   Chest wall pain, chronic    Chronic 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    Chronic Troponin Elevation    CKD (chronic kidney disease), stage II-III    COPD (chronic obstructive pulmonary disease) (HCC)    CPAP (continuous positive airway pressure) dependence 03/08/2023   Depression    DM (diabetes mellitus), type 2 (HCC)    Dyspnea    GERD (gastroesophageal reflux disease)    Headache    HFrEF (heart failure with reduced ejection fraction) (HCC)    History of 2019 novel coronavirus disease (COVID-19) 03/01/2022   History of kidney stones    History of methicillin resistant staphylococcus aureus (MRSA)    Hyperlipidemia    Hypertension    ICD (implantable cardioverter-defibrillator) in place 05/20/2022   a.) LEFT midaxillary [employer] Emblem MRI S-ICD (SN: [phone])   Left rotator cuff tear    Liver cirrhosis secondary to NASH (HCC)    Long term current use of amiodarone     Long term current use of immunosuppressive drug    a.) ixekizumab for psoriasis/psoriatic arthritis diagnosis   Long-term use of aspirin  therapy    Mediastinal lymphadenopathy    Mitral regurgitation    Mild to moderate by October 2021 echocardiogram.   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 12/2015 noted on tele-->amio;  b. 12/2015 Event monitor: no VT noted.   Obesity (BMI 30.0-34.9)    On rivaroxaban  therapy    OSA treated  with BiPAP    Pneumonia    Polyp of descending colon    Psoriasis    Psoriatic arthritis (HCC)    a.) Tx'd with ixekizumab   Pulmonary HTN (HCC) 02/29/2016   a.) TTE 02/29/2016: RVSP 47; b.) TTE 06/26/2019: RVSP 58.4; c.) TTE 12/08/2019: RVSP 40.8; d.) TTE 06/08/2020: RVSP 44.7; e.) R/LHC 06/08/2020: mRA 9, mPA 40, mPWCP 17, AO sat 98, PA sat 61, CO 3.1, CI 1.3, PVR 7.4, LVEDP 20; f.) R/LHC 08/18/2020: mRA 5, mPA 41, mPCWP 18, AO sat 92, PA sat 59, CO 4.3, CI 1.9, PVR 5.3   Recurrent pulmonary emboli (HCC) 06/07/2020   06/07/20: small bilateral PEs.  12/31/19: RUL and RLL PEs.   Secondary polycythemia    a.) negative for JAK2 with reflex and BCR-ABL mutations; seeing hematology with etiology felt to be secondary to underlying COPD (quit smoking 04/2020), heart disease, OSAH, and obesity   Stroke [facility])    Syncope    a. 01/2016 - felt to be vasovagal.   Thrombocytopenia     Current Outpatient Medications  Medication Sig Dispense Refill   Accu-Chek Softclix Lancets lancets USE UP TO FOUR TIMES DAILY     albuterol  (VENTOLIN  HFA) 108 (90 Base) MCG/ACT inhaler Inhale 2 puffs into the lungs every 4 (four) hours as needed for wheezing or shortness of breath. 8 g 6   allopurinol  (ZYLOPRIM ) 300 MG tablet Take 300 mg by mouth every morning.     amiodarone  (PACERONE ) 200 MG tablet Take 1 tablet (200 mg total) by mouth daily. 90 tablet 3   aspirin  EC 81 MG tablet Take 81 mg by mouth every morning.     BD PEN NEEDLE NANO 2ND GEN 32G X 4 MM MISC USE  AS DIRECTED AT BEDTIME 200 each 0   blood glucose meter kit and supplies KIT Dispense based on patient and insurance preference. Use  up to four times daily as directed. 1 each 11   butalbital -acetaminophen -caffeine  (FIORICET ) 50-325-40 MG tablet Take 1 tablet by mouth every 4 (four) hours as needed for migraine.     carvedilol  (COREG ) 3.125 MG tablet Take 1 tablet (3.125 mg total) by mouth 2 (two) times daily with a meal. 60 tablet 3   cetirizine  (ZYRTEC ) 10 MG tablet Take 1 tablet (10 mg total) by mouth daily as needed for allergies. (Patient not taking: Reported on 06/02/2024) 30 tablet 2   ciclopirox  (PENLAC ) 8 % solution Apply topically at bedtime. Apply over nail and surrounding skin. Apply daily over previous coat. After seven (7) days, may remove with alcohol and continue cycle. 6.6 mL 5   Continuous Glucose Sensor (FREESTYLE LIBRE 3 PLUS SENSOR) MISC Place 1 sensor on the skin every 15 days. Use to check glucose continuously 2 each 12   dapagliflozin  propanediol (FARXIGA ) 10 MG TABS tablet Take 1 tablet (10 mg total) by mouth daily. 90 tablet 3   ezetimibe  (ZETIA ) 10 MG tablet Take 1 tablet (10 mg total) by mouth daily. 90 tablet 3   fenofibrate  (TRICOR ) 145 MG tablet Take 1 tablet (145 mg total) by mouth every evening. 90 tablet 3   Furosemide  (FUROSCIX ) 80 MG/10ML CTKT Inject 80 mg into the skin daily as needed. For weight above 250lbs from edema 10 each 2   insulin  glargine (LANTUS ) 100 UNIT/ML Solostar Pen Inject 50 Units into the skin at bedtime. Make continue to decrease by 2 units every 2 days for a goal of fasting blood glucose 80-89 15 mL 11  insulin  lispro (HUMALOG ) 100 UNIT/ML KwikPen Inject 20 Units into the skin 3 (three) times daily. + CORRECTION BEFORE BREAKFAST/LUNCH AND SUPPER (CORRECTION = 1 UNIT PER 50 MG/DL ABOVE 849); MAX DAILY DOSE 77 UNITS 15 mL 11   Insulin  Pen Needle 32G X 4 MM MISC USE WITH INSULIN  IN THE MORNING, AT NOON, IN THE EVENING AND AT BEDTIME 400 each 11   ipratropium-albuterol  (DUONEB) 0.5-2.5 (3) MG/3ML SOLN Take 3 mLs by nebulization every 6 (six) hours as needed. 360 mL 11   meclizine   (ANTIVERT ) 25 MG tablet Take 0.5 tablets (12.5 mg total) by mouth 3 (three) times daily as needed for dizziness.     nitroGLYCERIN  (NITROSTAT ) 0.4 MG SL tablet Place 1 tablet (0.4 mg total) under the tongue every 5 (five) minutes x 3 doses as needed for chest pain. 30 tablet 3   omeprazole  (PRILOSEC) 40 MG capsule Take 1 capsule (40 mg total) by mouth daily. 30 capsule 3   potassium chloride  SA (KLOR-CON  M) 20 MEQ tablet Take 1 tablet (20 mEq total) by mouth every other day. AM 45 tablet 3   rivaroxaban  (XARELTO ) 20 MG TABS tablet Take 1 tablet (20 mg total) by mouth daily with supper. 90 tablet 3   rosuvastatin  (CRESTOR ) 20 MG tablet Take 1 tablet (20 mg total) by mouth daily. 90 tablet 3   sacubitril -valsartan  (ENTRESTO ) 24-26 MG Take 1 tablet by mouth 2 (two) times daily. 180 tablet 1   spironolactone  (ALDACTONE ) 25 MG tablet Take 25 mg by mouth daily.     TALTZ 80 MG/ML SOAJ Inject 3 mLs into the skin every 28 (twenty-eight) days. (Patient not taking: Reported on 06/02/2024)     tirzepatide  (MOUNJARO ) 12.5 MG/0.5ML Pen Inject 12.5 mg into the skin once a week.     Torsemide  40 MG TABS Take 40 mg by mouth daily. 90 tablet 3   No current facility-administered medications for this visit.   Vitals:   06/17/24 0841  BP: 134/89  Pulse: 86  SpO2: 93%  Weight: 260 lb (117.9 kg)   Wt Readings from Last 3 Encounters:  06/17/24 260 lb (117.9 kg)  05/26/24 257 lb 9.6 oz (116.8 kg)  05/19/24 260 lb 2 oz (118 kg)   Lab Results  Component Value Date   CREATININE 2.01 (H) 04/13/2024   CREATININE 2.16 (H) 04/06/2024   CREATININE 1.50 (H) 02/24/2024    PHYSICAL EXAM:  General: Well appearing.  Cor: No JVD. Regular rhythm, rate.  Lungs: clear Abdomen: soft, nontender, distended. Extremities: no edema Neuro:. Affect pleasant   ECG: not done   ASSESSMENT & PLAN:  1.  Chronic HFrEF/Mixed Ischemic and nonischemic cardiomyopathy:   - Mixed ICM/NICM - NYHA [name] III  - fluid up with  weight gain and worsening symptoms. Has used 4 furoscix  in last month along with self-titration of torsemide  without relief - Begin metolazone  2.5mg  daily X 3 doses. BMET today and will let him know if additional potassium supplement is needed.  - s/p [employer]-ICD; denies any shocks - weight up 8 pounds from last visit here 2 months ago - Echo 05/21/21: EF of 40-45% along with mild LVH and mild Gabriel.  - Echo 11/24/21: EF of 20-25% along with mild LVH and mild Gabriel.  - Echo 02/06/22: EF of 30-35% along with mild/moderate AR.  - Echo 08/14/22: EF 40-45% with mild LVH, Grade I DD, severe hypokinesis of left ventricular, entire inferior and inferolateral wall.  - Echo 03/08/23: EF 45-50% with moderate  LVH, trivial Gabriel, mild dilatation of aortic root at 43 mm - Echo 10/1823: EF 35-40%, global LV hypokinesis, severely dilated LV internal cavity size, mild LVH, small pericardial effusion, mild Gabriel. - Echo 01/02/24: EF 35-40%, mild LVH, low normal RV, mild LAE, mild/ moderate AR, mild dilatation of the aortic root, measuring 40 mm  - will schedule RHC to evaluate pressures/ fluid status . CBC, BMET today - continue carvedilol  3.125mg  BID - continue Farxiga  10mg  daily - continue Entresto  24/26mg  bid. He does not want to come off entresto  if BP declines - continue spiro 25mg  daily - continue torsemide  80mg  daily.  - continue potassium 20meq every other day - continue to use Furoscix  if weight >= 250 - consider Cardiomems to help volume management as needed => his current insurance won't cover. Can revisit this after he gets new insurance 10/25 - saw ADHF provider (Bensimhon) 09/24 - BNP 04/06/24 reviewed and was 25.8. BNP today   2.  CAD/Elevated Troponin and chronic CP:  - H/o CAD  - RHC/ LHC 08/19/23:     1. Relatively stable appearance of the coronary arteries since last catheterization in 2021.  Chronically occluded proximal RCA now has antegrade flow, though I suspect this represents bridging  collaterals.  Left-to-right collateral remain as well as moderate LAD and LCx disease.    2. Mildly elevated left heart filling pressures (LVEDP 20 mmHg, PCWP 18 mmHg).    3. Moderate pulmonary hypertension (mean PA 41 mmHg, PVR 5.3 WU).    4. Moderately reduced Fick cardiac output/index (CO 4.3 L/min, CI 1.9 L/min/m^2). - continue rosuvastatin  20mg  daily - saw cardiology Florestine) 07/25 - lipid panel 01/01/24: LDL 94, triglycerides 341 - insurance is currently not covering repatha  injection   3.  HTN:  - BP 134/89 - saw PCP Mazie) 09/25 - BMP 04/13/24 reviewed: sodium 139, potassium 4.4, creatinine 2.01 & GFR 38 - BMET today   4.  H/o PE/Chronic anticoagulation:  - continue xarelto  20mg  daily - no recent bleeding   5.  DM2- - saw nephrology (Korrapati) 08/23  - A1c 04/02/24 reviewed and was 8.8% - saw endocrinology Art) 09/25   6. OSA- - Sleep study 3/22: AHI 28  - sleep study 12/23; AHI 45.1 - sleep study 11/24 AHI 70.0 - wearing CPAP nightly unless feeling very congested - saw pulmonology Jodeen) 11/24  7: AF- - saw EP Marny) 10/25 - cardioverted 11/06/23. Plan for possible ablation in the future as previous ablation was cancelled due to clinical instability.  - continue amiodarone  400mg  BID taper - continue ASA 81mg  daily - continue xarelto  20mg  daily   Return in 1 week after RHC, sooner if needed.   I spent 30 minutes reviewing records, interviewing/ examing patient and managing plan/ orders.    Gabriel DELENA Class, FNP 06/17/24

## 2024-06-17 NOTE — Patient Instructions (Signed)
 It was good to see you today!   Medication Changes:  START Metolazone  2.5 MG for 3 days  Lab Work:  Labs done today, your results will be available in MyChart, we will contact you for abnormal readings.   Testing/Procedures:  You are scheduled for a Cardiac Catheterization on Wednesday, November 5 with Dr. Toribio Fuel.  1. Please arrive at the [facility] (Main Entrance A) at [facility]: [address] [place], [place] [address] at 6:30 AM (This time is 2 hour(s) before your procedure to ensure your preparation).   Free valet parking service is available. You will check in at ADMITTING. The support person will be asked to wait in the waiting room.  It is OK to have someone drop you off and come back when you are ready to be discharged.    Special note: Every effort is made to have your procedure done on time. Please understand that emergencies sometimes delay scheduled procedures.  2. Diet: Nothing to eat after midnight.   3. Hydration: On November 5, you may drink approved liquids (see below) until 2 hours before the procedure with 8 oz of water as your last intake.   List of approved liquids water, clear juice, clear tea, black coffee, fruit juices, non-citric and without pulp, carbonated beverages, Gatorade, Kool -Aid, plain Jello-O and plain ice popsicles.  4. Labs: Done at visit today 5. Medication instructions in preparation for your procedure:   Contrast Allergy : No  We Will call you with instructions on which medications to hold before your procedure.  On the morning of your procedure, take your Aspirin  81 mg and any morning medicines NOT listed above.  You may use sips of water.  6. Plan to go home the same day, you will only stay overnight if medically necessary. 7. Bring a current list of your medications and current insurance cards. 8. You MUST have a responsible person to drive you home. 9. Someone MUST be with you the first 24 hours after you arrive  home or your discharge will be delayed. 10. Please wear clothes that are easy to get on and off and wear slip-on shoes.  Thank you for allowing us  to care for you!   --  Invasive Cardiovascular services       Follow-Up in: June 28, 2024 at 8:30 AM with Ellouise Class    If you have any questions or concerns before your next appointment please send us  a message through [facility] or call our office at [phone], If it is after office hours your call will be answered by our answering service and directed appropriately.     At the [facility], you and your health needs are our priority. We have a designated team specialized in the treatment of Heart Failure. This Care Team includes your primary Heart Failure Specialized Cardiologist (physician), Advanced Practice Providers (APPs- Physician Assistants and Nurse Practitioners), and Pharmacist who all work together to provide you with the care you need, when you need it.   You may see any of the following providers on your designated Care Team at your next follow up:  Dr. Toribio Fuel Dr. Ezra Shuck Dr. Ria Commander Dr. Odis Brownie Greig Mosses, NP Caffie Shed, [place] [address] [place], [place] Beckey Coe, NP Jordan Lee, NP Ellouise Class, NP Jaun Bash, PharmD

## 2024-06-17 NOTE — Telephone Encounter (Signed)
 Pt aware, agreeable, and verbalized understanding   You are scheduled for a Cardiac Catheterization on Wednesday, November 5 with Dr. Toribio Fuel.  Medication Instructions:   Stop taking Xarelto  (Rivaroxaban ) on Wednesday, November 5.  Stop taking, Torsemide  (Demadex ) Wednesday, November 5,  Stop taking Insulin  Wednesday, November 5.  Stop taking Farxiga  on Wednesday, November 5.   If the procedure is scheduled on your normal day to take Mounjaro , hold until after the procedure.   On the morning of your procedure, take your Aspirin  81 mg and any morning medicines NOT listed above.  You may use sips of water.  6. Plan to go home the same day, you will only stay overnight if medically necessary. 7. Bring a current list of your medications and current insurance cards. 8. You MUST have a responsible person to drive you home. 9. Someone MUST be with you the first 24 hours after you arrive home or your discharge will be delayed. 10. Please wear clothes that are easy to get on and off and wear slip-on shoes.  Thank you for allowing us  to care for you!   -- [place] Invasive Cardiovascular services

## 2024-06-18 ENCOUNTER — Encounter: Payer: Self-pay | Admitting: Oncology

## 2024-06-20 ENCOUNTER — Other Ambulatory Visit: Payer: Self-pay

## 2024-06-21 ENCOUNTER — Telehealth: Payer: Self-pay

## 2024-06-21 ENCOUNTER — Other Ambulatory Visit: Payer: Self-pay

## 2024-06-21 DIAGNOSIS — I5022 Chronic systolic (congestive) heart failure: Secondary | ICD-10-CM

## 2024-06-21 NOTE — Progress Notes (Signed)
 Orders placed for right heart cath.   No precert required

## 2024-06-21 NOTE — Telephone Encounter (Signed)
 Called patient to discuss cath instructions and ensure patient knows directions. Pt denied any questions. Pt instructed to hold xarelto  tomorrow, 06/22/24 until after catheterization, per Dr. Cherrie.  Pt states to let [facility] know that he has not noticed an increase in fluid loss from taking the metolazone , and did not feel that it helped.  [facility].

## 2024-06-22 ENCOUNTER — Other Ambulatory Visit: Payer: Self-pay | Admitting: Family Medicine

## 2024-06-22 DIAGNOSIS — N1831 Chronic kidney disease, stage 3a: Secondary | ICD-10-CM

## 2024-06-22 MED ORDER — LANTUS SOLOSTAR 100 UNIT/ML ~~LOC~~ SOPN: PEN_INJECTOR | SUBCUTANEOUS | 11 refills | Status: DC

## 2024-06-23 ENCOUNTER — Encounter (HOSPITAL_COMMUNITY)
Admission: RE | Disposition: A | Discharge status: Home / Self Care | Payer: Self-pay | Source: Home / Self Care | Attending: Internal Medicine

## 2024-06-23 ENCOUNTER — Other Ambulatory Visit: Payer: Self-pay

## 2024-06-23 ENCOUNTER — Ambulatory Visit (HOSPITAL_COMMUNITY)
Admission: RE | Admit: 2024-06-23 | Discharge: 2024-06-23 | Disposition: A | Discharge status: Home / Self Care | Attending: Internal Medicine | Admitting: Internal Medicine

## 2024-06-23 DIAGNOSIS — I482 Chronic atrial fibrillation, unspecified: Secondary | ICD-10-CM | POA: Diagnosis not present

## 2024-06-23 DIAGNOSIS — Z794 Long term (current) use of insulin: Secondary | ICD-10-CM | POA: Insufficient documentation

## 2024-06-23 DIAGNOSIS — N189 Chronic kidney disease, unspecified: Secondary | ICD-10-CM | POA: Diagnosis not present

## 2024-06-23 DIAGNOSIS — I130 Hypertensive heart and chronic kidney disease with heart failure and stage 1 through stage 4 chronic kidney disease, or unspecified chronic kidney disease: Secondary | ICD-10-CM | POA: Insufficient documentation

## 2024-06-23 DIAGNOSIS — Z87891 Personal history of nicotine dependence: Secondary | ICD-10-CM | POA: Diagnosis not present

## 2024-06-23 DIAGNOSIS — Z7982 Long term (current) use of aspirin: Secondary | ICD-10-CM | POA: Diagnosis not present

## 2024-06-23 DIAGNOSIS — I2510 Atherosclerotic heart disease of native coronary artery without angina pectoris: Secondary | ICD-10-CM | POA: Insufficient documentation

## 2024-06-23 DIAGNOSIS — Z7984 Long term (current) use of oral hypoglycemic drugs: Secondary | ICD-10-CM | POA: Diagnosis not present

## 2024-06-23 DIAGNOSIS — Z79899 Other long term (current) drug therapy: Secondary | ICD-10-CM | POA: Diagnosis not present

## 2024-06-23 DIAGNOSIS — Z7901 Long term (current) use of anticoagulants: Secondary | ICD-10-CM | POA: Insufficient documentation

## 2024-06-23 DIAGNOSIS — Z59869 Financial insecurity, unspecified: Secondary | ICD-10-CM | POA: Insufficient documentation

## 2024-06-23 DIAGNOSIS — Z7985 Long-term (current) use of injectable non-insulin antidiabetic drugs: Secondary | ICD-10-CM | POA: Diagnosis not present

## 2024-06-23 DIAGNOSIS — I428 Other cardiomyopathies: Secondary | ICD-10-CM | POA: Diagnosis not present

## 2024-06-23 DIAGNOSIS — Z86711 Personal history of pulmonary embolism: Secondary | ICD-10-CM | POA: Diagnosis not present

## 2024-06-23 DIAGNOSIS — G4733 Obstructive sleep apnea (adult) (pediatric): Secondary | ICD-10-CM | POA: Diagnosis not present

## 2024-06-23 DIAGNOSIS — I509 Heart failure, unspecified: Secondary | ICD-10-CM | POA: Diagnosis not present

## 2024-06-23 DIAGNOSIS — I5022 Chronic systolic (congestive) heart failure: Secondary | ICD-10-CM | POA: Diagnosis not present

## 2024-06-23 DIAGNOSIS — E1122 Type 2 diabetes mellitus with diabetic chronic kidney disease: Secondary | ICD-10-CM | POA: Insufficient documentation

## 2024-06-23 HISTORY — PX: RIGHT HEART CATH: CATH118263

## 2024-06-23 LAB — POCT I-STAT EG7
Acid-Base Excess: 2 mmol/L (ref 0.0–2.0)
Acid-Base Excess: 3 mmol/L — ABNORMAL HIGH (ref 0.0–2.0)
Bicarbonate: 28 mmol/L (ref 20.0–28.0)
Bicarbonate: 29.2 mmol/L — ABNORMAL HIGH (ref 20.0–28.0)
Calcium, Ion: 1.09 mmol/L — ABNORMAL LOW (ref 1.15–1.40)
Calcium, Ion: 1.22 mmol/L (ref 1.15–1.40)
HCT: 45 % (ref 39.0–52.0)
HCT: 47 % (ref 39.0–52.0)
Hemoglobin: 15.3 g/dL (ref 13.0–17.0)
Hemoglobin: 16 g/dL (ref 13.0–17.0)
O2 Saturation: 59 %
O2 Saturation: 64 %
Potassium: 3.3 mmol/L — ABNORMAL LOW (ref 3.5–5.1)
Potassium: 3.5 mmol/L (ref 3.5–5.1)
Sodium: 140 mmol/L (ref 135–145)
Sodium: 142 mmol/L (ref 135–145)
TCO2: 29 mmol/L (ref 22–32)
TCO2: 31 mmol/L (ref 22–32)
pCO2, Ven: 47.1 mmHg (ref 44–60)
pCO2, Ven: 48 mmHg (ref 44–60)
pH, Ven: 7.383 (ref 7.25–7.43)
pH, Ven: 7.392 (ref 7.25–7.43)
pO2, Ven: 32 mmHg (ref 32–45)
pO2, Ven: 34 mmHg (ref 32–45)

## 2024-06-23 LAB — GLUCOSE, CAPILLARY: Glucose-Capillary: 205 mg/dL — ABNORMAL HIGH (ref 70–99)

## 2024-06-23 SURGERY — RIGHT HEART CATH
Anesthesia: LOCAL

## 2024-06-23 MED ORDER — SODIUM CHLORIDE 0.9% FLUSH: 3.0000 mL | Freq: Two times a day (BID) | INTRAVENOUS | Status: DC

## 2024-06-23 MED ORDER — HYDRALAZINE HCL 20 MG/ML IJ SOLN: 10.0000 mg | INTRAMUSCULAR | Status: DC | PRN

## 2024-06-23 MED ORDER — LABETALOL HCL 5 MG/ML IV SOLN: 10.0000 mg | INTRAVENOUS | Status: DC | PRN

## 2024-06-23 MED ORDER — SODIUM CHLORIDE 0.9% FLUSH
3.0000 mL | INTRAVENOUS | Status: DC | PRN
Start: 2024-06-23 — End: 2024-06-23

## 2024-06-23 MED ORDER — SODIUM CHLORIDE 0.9 % IV SOLN: 250.0000 mL | INTRAVENOUS | Status: DC | PRN

## 2024-06-23 MED ORDER — HEPARIN (PORCINE) IN NACL 1000-0.9 UT/500ML-% IV SOLN
INTRAVENOUS | Status: DC | PRN
  Administered 2024-06-23: 500 mL

## 2024-06-23 MED ORDER — SODIUM CHLORIDE 0.9% FLUSH: 3.0000 mL | INTRAVENOUS | Status: DC | PRN

## 2024-06-23 MED ORDER — LIDOCAINE HCL (PF) 1 % IJ SOLN
INTRAMUSCULAR | Status: AC
  Filled 2024-06-23: qty 30

## 2024-06-23 MED ORDER — ACETAMINOPHEN 325 MG PO TABS: 650.0000 mg | ORAL_TABLET | ORAL | Status: DC | PRN

## 2024-06-23 MED ORDER — ONDANSETRON HCL 4 MG/2ML IJ SOLN: 4.0000 mg | Freq: Four times a day (QID) | INTRAMUSCULAR | Status: DC | PRN

## 2024-06-23 MED ORDER — FREE WATER
250.0000 mL | Freq: Once | Status: AC
  Administered 2024-06-23: 250 mL via ORAL

## 2024-06-23 MED ORDER — LIDOCAINE HCL (PF) 1 % IJ SOLN
INTRAMUSCULAR | Status: DC | PRN
  Administered 2024-06-23: 2 mL via INTRADERMAL

## 2024-06-23 MED ORDER — ASPIRIN 81 MG PO CHEW
81.0000 mg | CHEWABLE_TABLET | ORAL | Status: AC
  Administered 2024-06-23: 81 mg via ORAL
  Filled 2024-06-23: qty 1

## 2024-06-23 SURGICAL SUPPLY — 5 items
CATH SWAN GANZ 7F STRAIGHT (CATHETERS) IMPLANT
GLIDESHEATH SLENDER 7FR .021G (SHEATH) IMPLANT
PACK CARDIAC CATHETERIZATION (CUSTOM PROCEDURE TRAY) ×2 IMPLANT
TRANSDUCER W/STOPCOCK (MISCELLANEOUS) IMPLANT
TUBING ART PRESS 72 MALE/FEM (TUBING) IMPLANT

## 2024-06-23 NOTE — Interval H&P Note (Signed)
 History and Physical Interval Note:  06/23/2024 8:53 AM  Calbert LOISE Darrel Raddle.  has presented today for surgery, with the diagnosis of heart failure.  The various methods of treatment have been discussed with the patient and family. After consideration of risks, benefits and other options for treatment, the patient has consented to  Procedure(s): RIGHT HEART CATH (N/A) as a surgical intervention.  The patient's history has been reviewed, patient examined, no change in status, stable for surgery.  I have reviewed the patient's chart and labs.  Questions were answered to the patient's satisfaction.     Ashtyn Freilich

## 2024-06-23 NOTE — Discharge Instructions (Signed)
 Brachial Site Care   This sheet gives you information about how to care for yourself after your procedure. Your health care provider may also give you more specific instructions. If you have problems or questions, contact your health care provider. What can I expect after the procedure? After the procedure, it is common to have: Bruising and tenderness at the catheter insertion area. Follow these instructions at home:  Insertion site care Follow instructions from your health care provider about how to take care of your insertion site. Make sure you: Wash your hands with soap and water before you change your bandage (dressing). If soap and water are not available, use hand sanitizer. Remove your dressing as told by your health care provider. In 24 hours Check your insertion site every day for signs of infection. Check for: Redness, swelling, or pain. Pus or a bad smell. Warmth. You may shower 24-48 hours after the procedure. Do not apply powder or lotion to the site.  Activity For 24 hours after the procedure, or as directed by your health care provider: Do not push or pull heavy objects with the affected arm. Do not drive yourself home from the hospital or clinic. You may drive 24 hours after the procedure unless your health care provider tells you not to. Do not lift anything that is heavier than 10 lb (4.5 kg), or the limit that you are told, until your health care provider says that it is safe.  For 24 hours

## 2024-06-24 ENCOUNTER — Encounter (HOSPITAL_COMMUNITY): Payer: Self-pay | Admitting: Internal Medicine

## 2024-06-27 NOTE — Progress Notes (Unsigned)
 Advanced Heart Failure Clinic Note    PCP: Ostwalt, Janna, PA  Primary Cardiologist: Gollan, Timothy, MD  HF cardiologist: Cherrie Sieving, MD   Chief Complaint: shortness of breath   HPI:  Gabriel [name] is a 53 y/o male with a history of atrial fibrillation (03/25), HTN, T2DM, chronic chest pain, chronic troponin elevation, COPD, CKD, asthma, NSVT, OSA, unprovoked PE, pulmonary HTN, psoriasis, migraines, cirrhosis from NASH, ICD placement (10/23), previous tobacco use and chronic heart failure. Had left shoulder arthroscopy with debridement, decompression, rotator cuff repair and biceps tenodesis done 09/16/23.   RHC/LHC done 06/13/20 and showed: Severe single-vessel coronary artery disease with chronic total occlusion of the proximal RCA.  The distal vessel fills via left to right collaterals. Mild to moderate, nonobstructive CAD involving the LAD and LCx with up to 50% stenosis. Mildly elevated left and right heart filling pressures. Moderate pulmonary hypertension (mean PAP 40 mmHg) with significantly elevated pulmonary vascular resistance (PVR 7.4 WU). Severely reduced Fick cardiac output/index.  Echo 05/21/21: EF of 40-45% along with mild LVH and mild Gabriel.   Admitted 09/2021 & 11/2021 with HF.   Echo 11/24/21: EF of 20-25% along with mild LVH and mild Gabriel.  Echo 02/06/22: EF of 30-35% along with mild/moderate AR.   Admitted 05/20/22 for ICD placement and discharged the next day. Was in the ED 12/21/22 due to dizziness & chest pain.   Echo 08/14/22: EF 40-45% with mild LVH, Grade I DD, severe hypokinesis of left ventricular, entire inferior and inferolateral wall.  Echo 03/08/23: EF 45-50% with moderate LVH, trivial Gabriel, mild dilatation of aortic root at 43 mm  Was in the ED 06/09/23 due to SOB/ chest pain for previous 2 days. CXR concerning for pneumonia so antibiotics provided. Initial troponin in the 50's which is close to his baseline. EKG without ischemic changes.   RHC/ LHC  08/19/23:  Relatively stable appearance of the coronary arteries since last catheterization in 2021.  Chronically occluded proximal RCA now has antegrade flow, though I suspect this represents bridging collaterals.  Left-to-right collateral remain as well as moderate LAD and LCx disease. Mildly elevated left heart filling pressures (LVEDP 20 mmHg, PCWP 18 mmHg). Moderate pulmonary hypertension (mean PA 41 mmHg, PVR 5.3 WU). Moderately reduced Fick cardiac output/index (CO 4.3 L/min, CI 1.9 L/min/m^2).  Admitted 10/22/23 due to COPD exacerbation secondary to influenza.   Admitted 11/03/23 with dizziness, tachycardia and new onset AF. Echo 3/18 -- EF 35-40%, global LV hypokinesis, severely dilated LV internal cavity size, mild LVH, small pericardial effusion, mild Gabriel. IV diuresed, GDMT restarted. Successful cardioversion 11/06/23. Amiodarone  loaded.   Was seen in the HF clinic 12/05/23 where torsemide  was increased to 40mg  daily X 3 days and then decreased back to 20mg  daily.   Was seen in the HF clinic 12/10/23 & was sent for IV lasix  due to being fluid up. Zio monitor placed due to increased palpitations.   Echo 01/02/24: EF 35-40%, mild LVH, low normal RV, mild LAE, mild/ moderate AR, mild dilatation of the aortic root, measuring 40 mm   Admitted 02/06/24 with SOB and hypoxia for several days prior to admission. CXR showed edema, no effusions. ECG NSR. BNP 600. IV diuresed with transition to oral diuretics.   Seen in [facility] 06/17/24 and due to being fluid up, given 3 doses of metolazone  2.5mg  given and RHC was scheduled.   RHC 06/23/24:  RA = 3 RV = 36/5 PA = 34/15 (24) PCW = 22 Fick cardiac output/index = 4.3/1.8  TD CO/CI = 4.8/2.0 PVR = 0.4 WU Ao sat = 94%  PA sat = 59%, 64% PAPi = 6.3  Assessment: 1. Mildly elevated filling pressures with moderate to severely reduced CO   He presents today for a HF follow-up visit with a chief complaint of worsening shortness of breath. Has associated  fatigue, chest pain, palpitations, occasional dizziness, abdominal distention, continued gradual weight gain. Sleeping ok on 2 pillows.   Hasn't worn BiPap in ~ 1 year. He says that he tolerated CPAP better but when it was changed to bipap, it makes him feel like he's choking. Took the metolazone  after last visit and says that he didn't urinate any more than he previously was. Hasn't used any more furoscix . Has been told previously that he has gallbladder sludge but no stones so he's wondering if his weight gain is more GI related.   ROS: All systems negative except as listed in HPI, PMH and Problem List.  SH:  Social History   Socioeconomic History   Marital status: Widowed    Spouse name: Not on file   Number of children: 1   Years of education: 16   Highest education level: 12th grade  Occupational History   Occupation: disabled  Tobacco Use   Smoking status: Former    Current packs/day: 0.00    Average packs/day: 0.5 packs/day for 33.0 years (16.5 ttl pk-yrs)    Types: Cigarettes    Start date: 05/09/1987    Quit date: 05/08/2020    Years since quitting: 4.1   Smokeless tobacco: Former    Quit date: 05/08/2020   Tobacco comments:    Quit Sept 2021  Vaping Use   Vaping status: Former  Substance and Sexual Activity   Alcohol use: Not Currently   Drug use: No   Sexual activity: Not Currently  Other Topics Concern   Not on file  Social History Narrative   Lives at home with wife and his dad's wife.        Works sales/advanced research scientist (life sciences); smoker; cutting; hx of alcoholism [started 13 year]; quit at age of 22 years.    Social Drivers of Health   Financial Resource Strain: Medium Risk (05/06/2024)   Received from [facility]   Overall Financial Resource Strain (CARDIA)    Difficulty of Paying Living Expenses: Somewhat hard  Food Insecurity: No Food Insecurity (05/06/2024)   Received from [facility]   Hunger Vital Sign    Within the past 12  months, you worried that your food would run out before you got the money to buy more.: Never true    Within the past 12 months, the food you bought just didn't last and you didn't have money to get more.: Never true  Transportation Needs: No Transportation Needs (05/06/2024)   Received from [facility] - Transportation    In the past 12 months, has lack of transportation kept you from medical appointments or from getting medications?: No    Lack of Transportation (Non-Medical): No  Physical Activity: Insufficiently Active (01/20/2018)   Exercise Vital Sign    Days of Exercise per Week: 7 days    Minutes of Exercise per Session: 10 min  Stress: Stress Concern Present (09/02/2017)   [name] - Occupational Stress Questionnaire    Feeling of Stress : Very much  Social Connections: Unknown (10/22/2023)   Social Connection and Isolation Panel    Frequency of Communication with  Friends and Family: Not on file    Frequency of Social Gatherings with Friends and Family: Patient declined    Attends Religious Services: Patient declined    Active Member of Clubs or Organizations: Patient declined    Attends Banker Meetings: Patient declined    Marital Status: Patient declined  Intimate Partner Violence: Not At Risk (05/06/2024)   Humiliation, Afraid, Rape, and Kick questionnaire    Fear of Current or Ex-Partner: No    Emotionally Abused: No    Physically Abused: No    Sexually Abused: No    FH:  Family History  Problem Relation Age of Onset   Diabetes Mother    Diabetes Mellitus II Mother    Hypothyroidism Mother    Hypertension Mother    Kidney failure Mother        Dialysis   Heart attack Mother        23 yo approximately   Hypertension Father    Gout Father    Cancer Maternal Grandfather    Diabetes Maternal Grandfather    Cancer Paternal Aunt     Past Medical History:  Diagnosis Date   Anginal pain     Aortic atherosclerosis    Aortic root dilation 11/28/2016   a.) TTE 11/28/2016: 43 mm; b.) TTE 11/23/2019: 38 mm; c.) TTE 08/14/2022: 40 mm; d.) TTE 03/08/2023: 43 mm; e.) TTE 01/02/2024: 40 mm   Arteriosclerosis of right carotid artery    a.) carotid doppler 04/29/2017: <50% RICA   Atrial fibrillation with RVR (HCC)    a.) CHA2DS2VASc = 6 (HFrEF, HTN, CVA x 2, vascular disease, T2DM) as of 01/27/2024; b.) s/p DCCV 11/06/2023 --> 200 J x 1 -->  NSR; c.) rate/rhythm maintained on oral amiodarone  + carvedilol ; chronically anticoagulated with rivaroxaban    CAD (coronary artery disease)    a.) MV 04/2015: low risk; b.) LHC 01/06/2017: minor irregs in LAD/Diag/LCX/OM, RCA 40p/m/d; c.) R/LHC 06/13/2020: 50% dLAD, 30% p-mLCx, 40% OM1, 100% pRCA (L-R collats) - med mgmt; d.) R/LHC 08/19/2023: 50% dLAD, 30% p-mLCx, 40% OM1, 100% pRCA (L-R collats), 50% p-mRCA - med mgmt   Cardiomegaly    Cerebellar infarct (HCC)    a.) brain MRI 06/23/2023: tiny chronic infarct within the RIGHT cerebellar hemisphere; unsure of timing of neurological event; no deficits   Chest wall pain, chronic    Chronic 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    Chronic Troponin Elevation    CKD (chronic kidney disease), stage II-III    COPD (chronic obstructive pulmonary disease) (HCC)    CPAP (continuous positive airway pressure) dependence 03/08/2023   Depression    DM (diabetes mellitus), type 2 (HCC)    Dyspnea    GERD (gastroesophageal reflux disease)    Headache    HFrEF (heart failure with reduced ejection fraction) (HCC)    History of 2019 novel coronavirus disease (COVID-19) 03/01/2022   History of kidney stones    History of methicillin resistant staphylococcus aureus (MRSA)    Hyperlipidemia    Hypertension    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 12/2015 noted on tele-->amio;  b. 12/2015 Event monitor: no VT noted.   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  12/31/19: RUL and RLL PEs.   Secondary polycythemia    a.) negative for JAK2 with reflex and BCR-ABL mutations; seeing hematology with etiology felt to be secondary to underlying COPD (quit smoking 04/2020), heart disease, OSAH, and obesity   Stroke [facility])    Syncope    a. 01/2016 - felt to be vasovagal.   Thrombocytopenia     Current Outpatient Medications  Medication Sig Dispense Refill   Accu-Chek Softclix Lancets lancets USE UP TO FOUR TIMES DAILY     albuterol  (VENTOLIN  HFA) 108 (90 Base) MCG/ACT inhaler Inhale 2 puffs into the lungs every 4 (four) hours as needed for wheezing or  shortness of breath. 8 g 6   allopurinol  (ZYLOPRIM ) 300 MG tablet Take 300 mg by mouth every morning.     amiodarone  (PACERONE ) 200 MG tablet Take 1 tablet (200 mg total) by mouth daily. 90 tablet 3   aspirin  EC 81 MG tablet Take 81 mg by mouth every morning.     BD PEN NEEDLE NANO 2ND GEN 32G X 4 MM MISC USE  AS DIRECTED AT BEDTIME 200 each 0   blood glucose meter kit and supplies KIT Dispense based on patient and insurance preference. Use up to four times daily as directed. 1 each 11   carvedilol  (COREG ) 3.125 MG tablet Take 1 tablet (3.125 mg total) by mouth 2 (two) times daily with a meal. 60 tablet 3   ciclopirox  (PENLAC ) 8 % solution Apply topically at bedtime. Apply over nail and surrounding skin. Apply daily over previous coat. After seven (7) days, may remove with alcohol and continue cycle. 6.6 mL 5   Continuous Glucose Sensor (FREESTYLE LIBRE 3 PLUS SENSOR) MISC Place 1 sensor on the skin every 15 days. Use to check glucose continuously 2 each 12   dapagliflozin  propanediol (FARXIGA ) 10 MG TABS tablet Take 1 tablet (10 mg total) by mouth daily. 90 tablet 3   ezetimibe  (ZETIA ) 10 MG tablet Take 1 tablet (10 mg total) by mouth daily. 90 tablet 3   fenofibrate  (TRICOR ) 145 MG tablet Take 1 tablet (145 mg total) by mouth every evening. 90 tablet 3   Furosemide  (FUROSCIX ) 80 MG/10ML CTKT Inject 80 mg into the skin daily as needed. For weight above 250lbs from edema 10  each 2   insulin  glargine (LANTUS  SOLOSTAR) 100 UNIT/ML Solostar Pen Inject 50 units into skin at bedtime. Decrease by 2 units every 2 days if fasting blood glucose is less than 90. 15 mL 11   insulin  lispro (HUMALOG ) 100 UNIT/ML KwikPen Inject 20 Units into the skin 3 (three) times daily. + CORRECTION BEFORE BREAKFAST/LUNCH AND SUPPER (CORRECTION = 1 UNIT PER 50 MG/DL ABOVE 849); MAX DAILY DOSE 77 UNITS 15 mL 11   Insulin  Pen Needle 32G X 4 MM MISC USE WITH INSULIN  IN THE MORNING, AT NOON, IN THE EVENING AND AT BEDTIME 400 each 11    ipratropium (ATROVENT) 0.06 % nasal spray Place 2 sprays into both nostrils every 8 (eight) hours.     ipratropium-albuterol  (DUONEB) 0.5-2.5 (3) MG/3ML SOLN Take 3 mLs by nebulization every 6 (six) hours as needed. 360 mL 11   meclizine  (ANTIVERT ) 25 MG tablet Take 0.5 tablets (12.5 mg total) by mouth 3 (three) times daily as needed for dizziness.     nitroGLYCERIN  (NITROSTAT ) 0.4 MG SL tablet Place 1 tablet (0.4 mg total) under the tongue every 5 (five) minutes x 3 doses as needed for chest pain. 30 tablet 3   omeprazole  (PRILOSEC) 40 MG capsule Take 1 capsule (40 mg total) by mouth daily. 90 capsule 2   potassium chloride  SA (KLOR-CON  M) 20 MEQ tablet Take 1 tablet (20 mEq total) by mouth every other day. AM 45 tablet 3   rivaroxaban  (XARELTO ) 20 MG TABS tablet Take 1 tablet (20 mg total) by mouth daily with supper. 90 tablet 3   rosuvastatin  (CRESTOR ) 20 MG tablet Take 1 tablet (20 mg total) by mouth daily. 90 tablet 3   sacubitril -valsartan  (ENTRESTO ) 24-26 MG Take 1 tablet by mouth 2 (two) times daily. 180 tablet 1   spironolactone  (ALDACTONE ) 25 MG tablet Take 25 mg by mouth daily.     tirzepatide  (MOUNJARO ) 12.5 MG/0.5ML Pen Inject 12.5 mg into the skin once a week.     Torsemide  40 MG TABS Take 40 mg by mouth daily. (Patient taking differently: Take 80 mg by mouth daily.) 90 tablet 3   No current facility-administered medications for this visit.   Vitals:   06/28/24 0816  BP: 133/79  Pulse: 93  SpO2: 95%  Weight: 263 lb (119.3 kg)   Wt Readings from Last 3 Encounters:  06/28/24 263 lb (119.3 kg)  06/23/24 260 lb (117.9 kg)  06/17/24 260 lb (117.9 kg)   Lab Results  Component Value Date   CREATININE 1.87 (H) 06/17/2024   CREATININE 2.01 (H) 04/13/2024   CREATININE 2.16 (H) 04/06/2024    PHYSICAL EXAM:  General: Well appearing.  Cor: No JVD. Regular rhythm, rate.  Lungs: clear Abdomen: soft, nontender, distended. Extremities: no edema Neuro:. Affect  pleasant    ECG: not done   ASSESSMENT & PLAN:  1.  Chronic HFrEF/Mixed Ischemic and nonischemic cardiomyopathy:   - Mixed ICM/NICM - NYHA class III  - appears euvolemic other than worsening symptoms/ weight gain - s/p [employer]; denies any shocks - weight up 3 pounds from last visit here 11 days ago - Echo 05/21/21: EF of 40-45% along with mild LVH and mild Gabriel.  - Echo 11/24/21: EF of 20-25% along with mild LVH and mild Gabriel.  - Echo 02/06/22: EF of 30-35% along with mild/moderate AR.  - Echo 08/14/22: EF 40-45% with mild LVH, Grade I DD, severe hypokinesis of left ventricular, entire inferior and inferolateral wall.  - Echo  03/08/23: EF 45-50% with moderate LVH, trivial Gabriel, mild dilatation of aortic root at 43 mm - Echo 10/1823: EF 35-40%, global LV hypokinesis, severely dilated LV internal cavity size, mild LVH, small pericardial effusion, mild Gabriel. - Echo 01/02/24: EF 35-40%, mild LVH, low normal RV, mild LAE, mild/ moderate AR, mild dilatation of the aortic root, measuring 40 mm  - RHC 06/23/24: Mildly elevated filling pressures with moderate to severely reduced CO - refer to cardiac rehab - continue carvedilol  3.125mg  BID - continue Farxiga  10mg  daily - increase Entresto  to 49/51mg  bid. Plan to tolerate to 97/103mg  at next visit - continue spiro 25mg  daily - continue torsemide  80mg  daily.  - continue potassium 20meq every other day - continue to use Furoscix  if weight >= 250 - consider Cardiomems to help volume management as needed => his current insurance won't cover. Can revisit this after he gets new insurance 10/25 - saw ADHF provider (Bensimhon) 09/24 - BNP 06/17/24 reviewed and was 60.7. BNP today   2.  CAD/Elevated Troponin and chronic CP:  - H/o CAD  - RHC/ LHC 08/19/23:     1. Relatively stable appearance of the coronary arteries since last catheterization in 2021.  Chronically occluded proximal RCA now has antegrade flow, though I suspect this represents bridging  collaterals.  Left-to-right collateral remain as well as moderate LAD and LCx disease.    2. Mildly elevated left heart filling pressures (LVEDP 20 mmHg, PCWP 18 mmHg).    3. Moderate pulmonary hypertension (mean PA 41 mmHg, PVR 5.3 WU).    4. Moderately reduced Fick cardiac output/index (CO 4.3 L/min, CI 1.9 L/min/m^2). - continue rosuvastatin  20mg  daily - saw cardiology Florestine) 07/25 - lipid panel 01/01/24: LDL 94, triglycerides 341 - insurance is currently not covering repatha  injection   3.  HTN:  - BP 133/79 - saw PCP Mazie) 09/25 - BMP 06/17/24 reviewed: sodium 140, potassium 3.9, creatinine 1.87 & GFR 42 - BMET today   4.  H/o PE/Chronic anticoagulation:  - continue xarelto  20mg  daily - no recent bleeding - HG 06/23/24 was 16.0   5.  DM2- - saw nephrology Kolleen) 08/23  - A1c 04/02/24 reviewed and was 8.8% - saw endocrinology Art) 09/25   6. OSA- - Sleep study 3/22: AHI 28  - sleep study 12/23; AHI 45.1 - sleep study 11/24 AHI 70.0 - has not worn bipap in ~ 1 year as he says that he feels like he's choking. He says that he tolerated CPAP better.  - explained how his untreated sleep apnea can be contributing to his worsening symptoms. Emphasized that he stop downstairs at pulmonology office and get f/u scheduled to discuss further - saw pulmonology Jodeen) 11/24  7: AF- - saw EP Marny) 10/25 - cardioverted 11/06/23. Plan for possible ablation in the future as previous ablation was cancelled due to clinical instability.  - continue amiodarone  400mg  BID taper - continue ASA 81mg  daily - continue xarelto  20mg  daily   Return in 3 weeks, sooner if needed.   I spent 30 minutes reviewing records, interviewing/ examing patient and managing plan/ orders.   Ellouise DELENA Class, FNP 06/27/24

## 2024-06-28 ENCOUNTER — Ambulatory Visit: Payer: Self-pay | Admitting: Family

## 2024-06-28 ENCOUNTER — Other Ambulatory Visit
Admission: RE | Admit: 2024-06-28 | Discharge: 2024-06-28 | Disposition: A | Discharge status: Home / Self Care | Source: Ambulatory Visit | Attending: Family | Admitting: Family

## 2024-06-28 ENCOUNTER — Encounter: Payer: Self-pay | Admitting: Family

## 2024-06-28 ENCOUNTER — Ambulatory Visit: Admitting: Family

## 2024-06-28 VITALS — BP 133/79 | HR 93 | Wt 263.0 lb

## 2024-06-28 DIAGNOSIS — E1122 Type 2 diabetes mellitus with diabetic chronic kidney disease: Secondary | ICD-10-CM

## 2024-06-28 DIAGNOSIS — I25118 Atherosclerotic heart disease of native coronary artery with other forms of angina pectoris: Secondary | ICD-10-CM | POA: Diagnosis not present

## 2024-06-28 DIAGNOSIS — I10 Essential (primary) hypertension: Secondary | ICD-10-CM | POA: Diagnosis not present

## 2024-06-28 DIAGNOSIS — I5022 Chronic systolic (congestive) heart failure: Secondary | ICD-10-CM | POA: Diagnosis present

## 2024-06-28 DIAGNOSIS — G4733 Obstructive sleep apnea (adult) (pediatric): Secondary | ICD-10-CM

## 2024-06-28 DIAGNOSIS — N1832 Chronic kidney disease, stage 3b: Secondary | ICD-10-CM

## 2024-06-28 DIAGNOSIS — I2699 Other pulmonary embolism without acute cor pulmonale: Secondary | ICD-10-CM | POA: Diagnosis not present

## 2024-06-28 DIAGNOSIS — I480 Paroxysmal atrial fibrillation: Secondary | ICD-10-CM

## 2024-06-28 DIAGNOSIS — Z794 Long term (current) use of insulin: Secondary | ICD-10-CM

## 2024-06-28 LAB — BASIC METABOLIC PANEL WITH GFR
Anion gap: 15 (ref 5–15)
BUN: 27 mg/dL — ABNORMAL HIGH (ref 6–20)
CO2: 26 mmol/L (ref 22–32)
Calcium: 9.3 mg/dL (ref 8.9–10.3)
Chloride: 99 mmol/L (ref 98–111)
Creatinine, Ser: 1.81 mg/dL — ABNORMAL HIGH (ref 0.61–1.24)
GFR, Estimated: 44 mL/min — ABNORMAL LOW (ref >60)
Glucose, Bld: 223 mg/dL — ABNORMAL HIGH (ref 70–99)
Potassium: 4 mmol/L (ref 3.5–5.1)
Sodium: 140 mmol/L (ref 135–145)

## 2024-06-28 LAB — BRAIN NATRIURETIC PEPTIDE: B Natriuretic Peptide: 143.3 pg/mL — ABNORMAL HIGH (ref 0.0–100.0)

## 2024-06-28 MED ORDER — SACUBITRIL-VALSARTAN 49-51 MG PO TABS: 1.0000 | ORAL_TABLET | Freq: Two times a day (BID) | ORAL | 6 refills | Status: DC

## 2024-06-28 NOTE — Patient Instructions (Signed)
 Medication Changes:  INCREASE Entresto  49-51mg  (1 tab) two times daily  Lab Work:  Go over to the [facility]. Go pass the gift shop and have your blood work completed.   We will only call you if the results are abnormal or if the provider would like to make medication changes.  No news is good news.   Referrals:  You have been referred to [facility]. They will contact you in order to schedule your appointment.     Follow-Up in: Please follow up with the Advanced Heart Failure Clinic in 3 weeks with Ellouise Class, FNP.   Thank you for choosing [facility].    At the Advanced Heart Failure Clinic, you and your health needs are our priority. We have a designated team specialized in the treatment of Heart Failure. This Care Team includes your primary Heart Failure Specialized Cardiologist (physician), Advanced Practice Providers (APPs- Physician Assistants and Nurse Practitioners), and Pharmacist who all work together to provide you with the care you need, when you need it.   You may see any of the following providers on your designated Care Team at your next follow up:  Dr. Toribio Fuel Dr. Ezra Shuck Dr. Ria Commander Dr. Morene Brownie Ellouise Class, FNP Jaun Bash, RPH-CPP  Please be sure to bring in all your medications bottles to every appointment.   Need to Contact Us :  If you have any questions or concerns before your next appointment please send us  a message through [place] or call our office at [phone].    TO LEAVE A MESSAGE FOR THE NURSE SELECT OPTION 2, PLEASE LEAVE A MESSAGE INCLUDING: YOUR NAME DATE OF BIRTH CALL BACK NUMBER REASON FOR CALL**this is important as we prioritize the call backs  YOU WILL RECEIVE A CALL BACK THE SAME DAY AS LONG AS YOU CALL BEFORE 4:00 PM

## 2024-06-29 ENCOUNTER — Ambulatory Visit
Admission: RE | Admit: 2024-06-29 | Discharge: 2024-06-29 | Disposition: A | Discharge status: Home / Self Care | Source: Ambulatory Visit | Attending: Family | Admitting: Family

## 2024-06-29 ENCOUNTER — Other Ambulatory Visit: Payer: Self-pay | Admitting: Family

## 2024-06-29 ENCOUNTER — Telehealth: Payer: Self-pay

## 2024-06-29 DIAGNOSIS — I5022 Chronic systolic (congestive) heart failure: Secondary | ICD-10-CM | POA: Insufficient documentation

## 2024-06-29 MED ORDER — POTASSIUM CHLORIDE CRYS ER 20 MEQ PO TBCR
40.0000 meq | EXTENDED_RELEASE_TABLET | Freq: Once | ORAL | Status: AC
  Administered 2024-06-29: 40 meq via ORAL

## 2024-06-29 MED ORDER — FUROSEMIDE 10 MG/ML IJ SOLN
80.0000 mg | Freq: Once | INTRAMUSCULAR | Status: AC
  Administered 2024-06-29: 80 mg via INTRAVENOUS

## 2024-06-29 MED ORDER — FUROSEMIDE 10 MG/ML IJ SOLN
INTRAMUSCULAR | Status: AC
  Filled 2024-06-29: qty 8

## 2024-06-29 MED ORDER — POTASSIUM CHLORIDE CRYS ER 20 MEQ PO TBCR
EXTENDED_RELEASE_TABLET | ORAL | Status: AC
  Filled 2024-06-29: qty 2

## 2024-06-29 NOTE — Progress Notes (Signed)
 IV lasix  orders placed

## 2024-06-29 NOTE — Telephone Encounter (Signed)
 Callers name: self Relation to patient:   Call back phone #:   Pharmacy (if applicable):   Issue/reason for call: pt stated that his weight is up 4lbs today from yesterday. Weight yesterday in office was 263lbs and today at another appt it was 267lbs. Pt states that he feels more swollen today than yesterday. Wanted to make Ellouise Class, FNP aware.

## 2024-06-29 NOTE — Telephone Encounter (Signed)
 Per Ellouise Class, FNP, pt to have IV lasix . Spoke with pt and same day surgery charge RN. Everyone agreeable to pt coming at 2pm for IV lasix .

## 2024-07-01 ENCOUNTER — Other Ambulatory Visit: Payer: Self-pay

## 2024-07-01 DIAGNOSIS — Z79899 Other long term (current) drug therapy: Secondary | ICD-10-CM

## 2024-07-06 ENCOUNTER — Other Ambulatory Visit: Payer: Self-pay

## 2024-07-06 ENCOUNTER — Emergency Department

## 2024-07-06 ENCOUNTER — Inpatient Hospital Stay
Admission: EM | Admit: 2024-07-06 | Discharge: 2024-07-14 | DRG: 286 | Disposition: A | Discharge status: Home / Self Care | Attending: Osteopathic Medicine | Admitting: Osteopathic Medicine

## 2024-07-06 DIAGNOSIS — R1011 Right upper quadrant pain: Secondary | ICD-10-CM | POA: Diagnosis not present

## 2024-07-06 DIAGNOSIS — I493 Ventricular premature depolarization: Secondary | ICD-10-CM | POA: Diagnosis present

## 2024-07-06 DIAGNOSIS — E66812 Obesity, class 2: Secondary | ICD-10-CM | POA: Diagnosis present

## 2024-07-06 DIAGNOSIS — I255 Ischemic cardiomyopathy: Secondary | ICD-10-CM | POA: Diagnosis present

## 2024-07-06 DIAGNOSIS — Z6837 Body mass index (BMI) 37.0-37.9, adult: Secondary | ICD-10-CM

## 2024-07-06 DIAGNOSIS — E7849 Other hyperlipidemia: Secondary | ICD-10-CM | POA: Diagnosis present

## 2024-07-06 DIAGNOSIS — F1021 Alcohol dependence, in remission: Secondary | ICD-10-CM | POA: Diagnosis present

## 2024-07-06 DIAGNOSIS — Z888 Allergy status to other drugs, medicaments and biological substances status: Secondary | ICD-10-CM

## 2024-07-06 DIAGNOSIS — E669 Obesity, unspecified: Secondary | ICD-10-CM

## 2024-07-06 DIAGNOSIS — Z59868 Other specified financial insecurity: Secondary | ICD-10-CM

## 2024-07-06 DIAGNOSIS — Z79899 Other long term (current) drug therapy: Secondary | ICD-10-CM

## 2024-07-06 DIAGNOSIS — Z87891 Personal history of nicotine dependence: Secondary | ICD-10-CM

## 2024-07-06 DIAGNOSIS — Z8249 Family history of ischemic heart disease and other diseases of the circulatory system: Secondary | ICD-10-CM

## 2024-07-06 DIAGNOSIS — E876 Hypokalemia: Secondary | ICD-10-CM | POA: Diagnosis present

## 2024-07-06 DIAGNOSIS — Z8614 Personal history of Methicillin resistant Staphylococcus aureus infection: Secondary | ICD-10-CM

## 2024-07-06 DIAGNOSIS — I5043 Acute on chronic combined systolic (congestive) and diastolic (congestive) heart failure: Secondary | ICD-10-CM | POA: Diagnosis not present

## 2024-07-06 DIAGNOSIS — I472 Ventricular tachycardia, unspecified: Secondary | ICD-10-CM | POA: Diagnosis present

## 2024-07-06 DIAGNOSIS — I252 Old myocardial infarction: Secondary | ICD-10-CM

## 2024-07-06 DIAGNOSIS — I130 Hypertensive heart and chronic kidney disease with heart failure and stage 1 through stage 4 chronic kidney disease, or unspecified chronic kidney disease: Secondary | ICD-10-CM | POA: Diagnosis not present

## 2024-07-06 DIAGNOSIS — K7460 Unspecified cirrhosis of liver: Secondary | ICD-10-CM | POA: Diagnosis present

## 2024-07-06 DIAGNOSIS — I25118 Atherosclerotic heart disease of native coronary artery with other forms of angina pectoris: Secondary | ICD-10-CM | POA: Diagnosis present

## 2024-07-06 DIAGNOSIS — R1010 Upper abdominal pain, unspecified: Principal | ICD-10-CM

## 2024-07-06 DIAGNOSIS — Z7982 Long term (current) use of aspirin: Secondary | ICD-10-CM

## 2024-07-06 DIAGNOSIS — E1122 Type 2 diabetes mellitus with diabetic chronic kidney disease: Secondary | ICD-10-CM | POA: Diagnosis present

## 2024-07-06 DIAGNOSIS — I428 Other cardiomyopathies: Secondary | ICD-10-CM | POA: Diagnosis present

## 2024-07-06 DIAGNOSIS — Z89022 Acquired absence of left finger(s): Secondary | ICD-10-CM

## 2024-07-06 DIAGNOSIS — Z794 Long term (current) use of insulin: Secondary | ICD-10-CM

## 2024-07-06 DIAGNOSIS — Z8419 Family history of other disorders of kidney and ureter: Secondary | ICD-10-CM

## 2024-07-06 DIAGNOSIS — Z860100 Personal history of colon polyps, unspecified: Secondary | ICD-10-CM

## 2024-07-06 DIAGNOSIS — J449 Chronic obstructive pulmonary disease, unspecified: Secondary | ICD-10-CM | POA: Diagnosis present

## 2024-07-06 DIAGNOSIS — Z7901 Long term (current) use of anticoagulants: Secondary | ICD-10-CM

## 2024-07-06 DIAGNOSIS — Z833 Family history of diabetes mellitus: Secondary | ICD-10-CM

## 2024-07-06 DIAGNOSIS — Z8616 Personal history of COVID-19: Secondary | ICD-10-CM

## 2024-07-06 DIAGNOSIS — K7581 Nonalcoholic steatohepatitis (NASH): Secondary | ICD-10-CM | POA: Diagnosis present

## 2024-07-06 DIAGNOSIS — E8881 Metabolic syndrome: Secondary | ICD-10-CM | POA: Diagnosis present

## 2024-07-06 DIAGNOSIS — R0602 Shortness of breath: Secondary | ICD-10-CM | POA: Diagnosis present

## 2024-07-06 DIAGNOSIS — I480 Paroxysmal atrial fibrillation: Secondary | ICD-10-CM | POA: Diagnosis present

## 2024-07-06 DIAGNOSIS — Z87442 Personal history of urinary calculi: Secondary | ICD-10-CM

## 2024-07-06 DIAGNOSIS — R570 Cardiogenic shock: Secondary | ICD-10-CM | POA: Diagnosis not present

## 2024-07-06 DIAGNOSIS — Z8673 Personal history of transient ischemic attack (TIA), and cerebral infarction without residual deficits: Secondary | ICD-10-CM

## 2024-07-06 DIAGNOSIS — R1900 Intra-abdominal and pelvic swelling, mass and lump, unspecified site: Secondary | ICD-10-CM | POA: Diagnosis present

## 2024-07-06 DIAGNOSIS — N179 Acute kidney failure, unspecified: Secondary | ICD-10-CM | POA: Diagnosis not present

## 2024-07-06 DIAGNOSIS — Z95810 Presence of automatic (implantable) cardiac defibrillator: Secondary | ICD-10-CM

## 2024-07-06 DIAGNOSIS — I272 Pulmonary hypertension, unspecified: Secondary | ICD-10-CM | POA: Diagnosis present

## 2024-07-06 DIAGNOSIS — N1831 Chronic kidney disease, stage 3a: Secondary | ICD-10-CM | POA: Diagnosis present

## 2024-07-06 DIAGNOSIS — Z7985 Long-term (current) use of injectable non-insulin antidiabetic drugs: Secondary | ICD-10-CM

## 2024-07-06 DIAGNOSIS — G4733 Obstructive sleep apnea (adult) (pediatric): Secondary | ICD-10-CM | POA: Diagnosis present

## 2024-07-06 DIAGNOSIS — E1169 Type 2 diabetes mellitus with other specified complication: Secondary | ICD-10-CM | POA: Diagnosis present

## 2024-07-06 DIAGNOSIS — E872 Acidosis, unspecified: Secondary | ICD-10-CM | POA: Diagnosis present

## 2024-07-06 DIAGNOSIS — E1165 Type 2 diabetes mellitus with hyperglycemia: Secondary | ICD-10-CM | POA: Diagnosis present

## 2024-07-06 DIAGNOSIS — I509 Heart failure, unspecified: Secondary | ICD-10-CM

## 2024-07-06 DIAGNOSIS — D45 Polycythemia vera: Secondary | ICD-10-CM | POA: Diagnosis present

## 2024-07-06 DIAGNOSIS — I5023 Acute on chronic systolic (congestive) heart failure: Secondary | ICD-10-CM | POA: Diagnosis present

## 2024-07-06 DIAGNOSIS — Z86711 Personal history of pulmonary embolism: Secondary | ICD-10-CM

## 2024-07-06 LAB — LIPASE, BLOOD: Lipase: 61 U/L — ABNORMAL HIGH (ref 11–51)

## 2024-07-06 LAB — CBC
HCT: 48.9 % (ref 39.0–52.0)
Hemoglobin: 15.8 g/dL (ref 13.0–17.0)
MCH: 27.7 pg (ref 26.0–34.0)
MCHC: 32.3 g/dL (ref 30.0–36.0)
MCV: 85.6 fL (ref 80.0–100.0)
Platelets: 169 K/uL (ref 150–400)
RBC: 5.71 MIL/uL (ref 4.22–5.81)
RDW: 13.2 % (ref 11.5–15.5)
WBC: 9.3 K/uL (ref 4.0–10.5)
nRBC: 0 % (ref 0.0–0.2)

## 2024-07-06 LAB — COMPREHENSIVE METABOLIC PANEL WITH GFR
ALT: 21 U/L (ref 0–44)
AST: 22 U/L (ref 15–41)
Albumin: 4.2 g/dL (ref 3.5–5.0)
Alkaline Phosphatase: 62 U/L (ref 38–126)
Anion gap: 14 (ref 5–15)
BUN: 18 mg/dL (ref 6–20)
CO2: 19 mmol/L — ABNORMAL LOW (ref 22–32)
Calcium: 9.7 mg/dL (ref 8.9–10.3)
Chloride: 104 mmol/L (ref 98–111)
Creatinine, Ser: 1.34 mg/dL — ABNORMAL HIGH (ref 0.61–1.24)
GFR, Estimated: >60 mL/min (ref >60)
Glucose, Bld: 226 mg/dL — ABNORMAL HIGH (ref 70–99)
Potassium: 4 mmol/L (ref 3.5–5.1)
Sodium: 138 mmol/L (ref 135–145)
Total Bilirubin: 0.8 mg/dL (ref 0.0–1.2)
Total Protein: 7.5 g/dL (ref 6.5–8.1)

## 2024-07-06 LAB — TROPONIN T, HIGH SENSITIVITY
Troponin T High Sensitivity: 42 ng/L — ABNORMAL HIGH (ref 0–19)
Troponin T High Sensitivity: 41 ng/L — ABNORMAL HIGH (ref 0–19)

## 2024-07-06 LAB — URINALYSIS, ROUTINE W REFLEX MICROSCOPIC
Bacteria, UA: NONE SEEN
Bilirubin Urine: NEGATIVE
Glucose, UA: >=500 mg/dL — AB
Ketones, ur: NEGATIVE mg/dL
Leukocytes,Ua: NEGATIVE
Nitrite: NEGATIVE
Protein, ur: 100 mg/dL — AB
Specific Gravity, Urine: 1.018 (ref 1.005–1.030)
Squamous Epithelial / HPF: 0 /HPF (ref 0–5)
pH: 5 (ref 5.0–8.0)

## 2024-07-06 LAB — RESP PANEL BY RT-PCR (RSV, FLU A&B, COVID)  RVPGX2
Influenza A by PCR: NEGATIVE
Influenza B by PCR: NEGATIVE
Resp Syncytial Virus by PCR: NEGATIVE
SARS Coronavirus 2 by RT PCR: NEGATIVE

## 2024-07-06 LAB — GLUCOSE, CAPILLARY: Glucose-Capillary: 253 mg/dL — ABNORMAL HIGH (ref 70–99)

## 2024-07-06 LAB — PRO BRAIN NATRIURETIC PEPTIDE: Pro Brain Natriuretic Peptide: 2488 pg/mL — ABNORMAL HIGH (ref <300.0)

## 2024-07-06 LAB — CBG MONITORING, ED
Glucose-Capillary: 281 mg/dL — ABNORMAL HIGH (ref 70–99)
Glucose-Capillary: 285 mg/dL — ABNORMAL HIGH (ref 70–99)

## 2024-07-06 MED ORDER — METHYLPREDNISOLONE SODIUM SUCC 125 MG IJ SOLR
125.0000 mg | Freq: Once | INTRAMUSCULAR | Status: AC
  Administered 2024-07-06: 125 mg via INTRAVENOUS
  Filled 2024-07-06: qty 2

## 2024-07-06 MED ORDER — ACETAMINOPHEN 325 MG PO TABS
650.0000 mg | ORAL_TABLET | ORAL | Status: DC | PRN
  Administered 2024-07-10: 650 mg via ORAL
  Filled 2024-07-06: qty 2

## 2024-07-06 MED ORDER — FUROSEMIDE 10 MG/ML IJ SOLN
80.0000 mg | Freq: Two times a day (BID) | INTRAMUSCULAR | Status: DC
  Administered 2024-07-06 – 2024-07-07 (×2): 80 mg via INTRAVENOUS
  Filled 2024-07-06 (×2): qty 8

## 2024-07-06 MED ORDER — IPRATROPIUM-ALBUTEROL 0.5-2.5 (3) MG/3ML IN SOLN
3.0000 mL | Freq: Once | RESPIRATORY_TRACT | Status: AC
  Administered 2024-07-06: 3 mL via RESPIRATORY_TRACT
  Filled 2024-07-06: qty 3

## 2024-07-06 MED ORDER — METOLAZONE 2.5 MG PO TABS
2.5000 mg | ORAL_TABLET | Freq: Once | ORAL | Status: AC
  Administered 2024-07-06: 2.5 mg via ORAL
  Filled 2024-07-06: qty 1

## 2024-07-06 MED ORDER — ONDANSETRON HCL 4 MG/2ML IJ SOLN: 4.0000 mg | Freq: Four times a day (QID) | INTRAMUSCULAR | Status: DC | PRN

## 2024-07-06 MED ORDER — IOHEXOL 350 MG/ML SOLN
100.0000 mL | Freq: Once | INTRAVENOUS | Status: AC | PRN
  Administered 2024-07-06: 100 mL via INTRAVENOUS

## 2024-07-06 MED ORDER — INSULIN ASPART 100 UNIT/ML IJ SOLN
10.0000 [IU] | Freq: Three times a day (TID) | INTRAMUSCULAR | Status: DC
  Administered 2024-07-06 – 2024-07-14 (×23): 10 [IU] via SUBCUTANEOUS
  Filled 2024-07-06 (×23): qty 10

## 2024-07-06 MED ORDER — SACUBITRIL-VALSARTAN 49-51 MG PO TABS
1.0000 | ORAL_TABLET | Freq: Two times a day (BID) | ORAL | Status: DC
  Administered 2024-07-06 – 2024-07-07 (×4): 1 via ORAL
  Filled 2024-07-06 (×5): qty 1

## 2024-07-06 MED ORDER — INSULIN GLARGINE-YFGN 100 UNIT/ML ~~LOC~~ SOLN
30.0000 [IU] | Freq: Every day | SUBCUTANEOUS | Status: DC
  Administered 2024-07-06 – 2024-07-10 (×5): 30 [IU] via SUBCUTANEOUS
  Filled 2024-07-06 (×5): qty 0.3

## 2024-07-06 MED ORDER — ALLOPURINOL 100 MG PO TABS
300.0000 mg | ORAL_TABLET | Freq: Every day | ORAL | Status: DC
  Administered 2024-07-06 – 2024-07-14 (×9): 300 mg via ORAL
  Filled 2024-07-06 (×4): qty 3
  Filled 2024-07-06: qty 1
  Filled 2024-07-06 (×4): qty 3

## 2024-07-06 MED ORDER — SODIUM CHLORIDE 0.9 % IV SOLN: 250.0000 mL | INTRAVENOUS | Status: AC | PRN

## 2024-07-06 MED ORDER — AMIODARONE HCL 200 MG PO TABS
200.0000 mg | ORAL_TABLET | Freq: Every day | ORAL | Status: DC
  Administered 2024-07-06 – 2024-07-14 (×9): 200 mg via ORAL
  Filled 2024-07-06 (×9): qty 1

## 2024-07-06 MED ORDER — CARVEDILOL 3.125 MG PO TABS
3.1250 mg | ORAL_TABLET | Freq: Two times a day (BID) | ORAL | Status: DC
  Administered 2024-07-06 – 2024-07-09 (×5): 3.125 mg via ORAL
  Filled 2024-07-06 (×5): qty 1

## 2024-07-06 MED ORDER — PANTOPRAZOLE SODIUM 40 MG IV SOLR
40.0000 mg | Freq: Once | INTRAVENOUS | Status: AC
  Administered 2024-07-06: 40 mg via INTRAVENOUS
  Filled 2024-07-06: qty 10

## 2024-07-06 MED ORDER — LIDOCAINE VISCOUS HCL 2 % MT SOLN
15.0000 mL | Freq: Once | OROMUCOSAL | Status: AC
  Administered 2024-07-06: 15 mL via ORAL
  Filled 2024-07-06: qty 15

## 2024-07-06 MED ORDER — FUROSEMIDE 10 MG/ML IJ SOLN
60.0000 mg | Freq: Once | INTRAMUSCULAR | Status: AC
  Administered 2024-07-06: 60 mg via INTRAVENOUS
  Filled 2024-07-06: qty 8

## 2024-07-06 MED ORDER — HYDROMORPHONE HCL 1 MG/ML IJ SOLN
0.5000 mg | Freq: Once | INTRAMUSCULAR | Status: AC
  Administered 2024-07-06: 0.5 mg via INTRAVENOUS
  Filled 2024-07-06: qty 0.5

## 2024-07-06 MED ORDER — ASPIRIN 81 MG PO TBEC
81.0000 mg | DELAYED_RELEASE_TABLET | Freq: Every day | ORAL | Status: DC
  Administered 2024-07-06 – 2024-07-14 (×8): 81 mg via ORAL
  Filled 2024-07-06 (×9): qty 1

## 2024-07-06 MED ORDER — INSULIN ASPART 100 UNIT/ML IJ SOLN
0.0000 [IU] | Freq: Every day | INTRAMUSCULAR | Status: DC
  Administered 2024-07-06 – 2024-07-09 (×3): 3 [IU] via SUBCUTANEOUS
  Administered 2024-07-10: 2 [IU] via SUBCUTANEOUS
  Administered 2024-07-11: 4 [IU] via SUBCUTANEOUS
  Administered 2024-07-13: 2 [IU] via SUBCUTANEOUS
  Filled 2024-07-06: qty 2
  Filled 2024-07-06: qty 4
  Filled 2024-07-06 (×2): qty 3
  Filled 2024-07-06: qty 2
  Filled 2024-07-06: qty 3

## 2024-07-06 MED ORDER — SODIUM CHLORIDE 0.9% FLUSH
3.0000 mL | Freq: Two times a day (BID) | INTRAVENOUS | Status: DC
  Administered 2024-07-06 – 2024-07-13 (×15): 3 mL via INTRAVENOUS

## 2024-07-06 MED ORDER — PANTOPRAZOLE SODIUM 40 MG PO TBEC
40.0000 mg | DELAYED_RELEASE_TABLET | Freq: Every day | ORAL | Status: DC
  Administered 2024-07-06 – 2024-07-14 (×9): 40 mg via ORAL
  Filled 2024-07-06 (×9): qty 1

## 2024-07-06 MED ORDER — IPRATROPIUM BROMIDE 0.06 % NA SOLN
2.0000 | Freq: Three times a day (TID) | NASAL | Status: DC
  Administered 2024-07-07 – 2024-07-14 (×20): 2 via NASAL
  Filled 2024-07-06 (×3): qty 15

## 2024-07-06 MED ORDER — ALUM & MAG HYDROXIDE-SIMETH 200-200-20 MG/5ML PO SUSP
30.0000 mL | Freq: Once | ORAL | Status: AC
  Administered 2024-07-06: 30 mL via ORAL
  Filled 2024-07-06: qty 30

## 2024-07-06 MED ORDER — IPRATROPIUM-ALBUTEROL 0.5-2.5 (3) MG/3ML IN SOLN: 3.0000 mL | Freq: Once | RESPIRATORY_TRACT | Status: DC

## 2024-07-06 MED ORDER — SODIUM CHLORIDE 0.9% FLUSH: 3.0000 mL | INTRAVENOUS | Status: DC | PRN

## 2024-07-06 MED ORDER — MORPHINE SULFATE (PF) 2 MG/ML IV SOLN
2.0000 mg | Freq: Once | INTRAVENOUS | Status: AC
  Administered 2024-07-06: 2 mg via INTRAVENOUS
  Filled 2024-07-06: qty 1

## 2024-07-06 MED ORDER — HYDRALAZINE HCL 20 MG/ML IJ SOLN: 5.0000 mg | Freq: Four times a day (QID) | INTRAMUSCULAR | Status: DC | PRN

## 2024-07-06 MED ORDER — INSULIN ASPART 100 UNIT/ML IJ SOLN
0.0000 [IU] | Freq: Three times a day (TID) | INTRAMUSCULAR | Status: DC
  Administered 2024-07-06 (×2): 11 [IU] via SUBCUTANEOUS
  Administered 2024-07-07 (×2): 7 [IU] via SUBCUTANEOUS
  Administered 2024-07-07: 11 [IU] via SUBCUTANEOUS
  Administered 2024-07-08: 3 [IU] via SUBCUTANEOUS
  Administered 2024-07-08: 7 [IU] via SUBCUTANEOUS
  Administered 2024-07-08: 4 [IU] via SUBCUTANEOUS
  Administered 2024-07-09 (×3): 7 [IU] via SUBCUTANEOUS
  Administered 2024-07-10: 11 [IU] via SUBCUTANEOUS
  Administered 2024-07-10 (×2): 7 [IU] via SUBCUTANEOUS
  Administered 2024-07-11: 11 [IU] via SUBCUTANEOUS
  Administered 2024-07-11 (×2): 7 [IU] via SUBCUTANEOUS
  Administered 2024-07-12: 11 [IU] via SUBCUTANEOUS
  Administered 2024-07-12: 7 [IU] via SUBCUTANEOUS
  Administered 2024-07-12: 15 [IU] via SUBCUTANEOUS
  Administered 2024-07-13: 11 [IU] via SUBCUTANEOUS
  Administered 2024-07-13 – 2024-07-14 (×3): 7 [IU] via SUBCUTANEOUS
  Filled 2024-07-06: qty 11
  Filled 2024-07-06 (×3): qty 7
  Filled 2024-07-06: qty 3
  Filled 2024-07-06: qty 15
  Filled 2024-07-06: qty 5
  Filled 2024-07-06: qty 7
  Filled 2024-07-06: qty 11
  Filled 2024-07-06: qty 7
  Filled 2024-07-06: qty 11
  Filled 2024-07-06 (×2): qty 7
  Filled 2024-07-06: qty 11
  Filled 2024-07-06 (×5): qty 7
  Filled 2024-07-06 (×2): qty 11
  Filled 2024-07-06: qty 7
  Filled 2024-07-06: qty 4
  Filled 2024-07-06: qty 11

## 2024-07-06 MED ORDER — ROSUVASTATIN CALCIUM 10 MG PO TABS
20.0000 mg | ORAL_TABLET | Freq: Every day | ORAL | Status: DC
  Administered 2024-07-06 – 2024-07-14 (×9): 20 mg via ORAL
  Filled 2024-07-06 (×4): qty 2
  Filled 2024-07-06: qty 1
  Filled 2024-07-06 (×4): qty 2

## 2024-07-06 MED ORDER — FUROSEMIDE 10 MG/ML IJ SOLN: 60.0000 mg | Freq: Two times a day (BID) | INTRAMUSCULAR | Status: DC

## 2024-07-06 MED ORDER — RIVAROXABAN 20 MG PO TABS
20.0000 mg | ORAL_TABLET | Freq: Every day | ORAL | Status: DC
  Administered 2024-07-06 – 2024-07-11 (×6): 20 mg via ORAL
  Filled 2024-07-06 (×7): qty 1

## 2024-07-06 MED ORDER — IPRATROPIUM-ALBUTEROL 0.5-2.5 (3) MG/3ML IN SOLN: 3.0000 mL | Freq: Four times a day (QID) | RESPIRATORY_TRACT | Status: DC | PRN

## 2024-07-06 MED ORDER — MECLIZINE HCL 25 MG PO TABS
12.5000 mg | ORAL_TABLET | Freq: Three times a day (TID) | ORAL | Status: DC | PRN
  Administered 2024-07-06: 12.5 mg via ORAL
  Filled 2024-07-06: qty 1

## 2024-07-06 NOTE — ED Notes (Signed)
 US  at bedside.

## 2024-07-06 NOTE — ED Provider Notes (Signed)
 8:17 AM Assumed care for off going team.   Blood pressure (!) 171/96, pulse 96, temperature 97.7 F (36.5 C), temperature source Oral, resp. rate 18, height 5' 11 (1.803 m), weight 122.7 kg, SpO2 95%.  See their HPI for full report but in brief pending CT dissection  8:19 AM patient requesting something additional for pain.  Patient was given 0.5 Dilaudid .  Reviewed CT scan personally interpreted did not see any evidence of obvious dissection waiting on CT report I did alert nurse that we do need a second troponin.   . No acute findings in the chest, abdomen or pelvis. No evidence of  thoracic or abdominal aortic aneurysm or dissection.  2. Tiny bilateral pleural effusions right larger than left with  associated basilar atelectasis.  3. Evidence of old granulomatous disease.  4. Mild mediastinal adenopathy likely reactive.  5. Aortic atherosclerosis. Atherosclerotic coronary artery disease.  6. Tiny umbilical hernia containing only peritoneal fat. Stable  small renal cysts.   On review of record patient did undergo ultrasound of the gallbladder on 8/20 that did not see any evidence of gallstones.  I reviewed the cath report from 06/17/2024 where patient had mildly elevated filling pressures  9:16 AM reevaluated patient attempted to stand him up and stated he felt dizzy with standing.  When they were try to get him back into the bed just with the slight movement oxygen levels went down to 86% and he had increased work of breathing.  He does not wear oxygen at home.  He does have wheezing on assessment therefore suspect this is most likely COPD exacerbation.  Will give additional  DuoNeb and discussed with hospital team for admission.  Given reported some upper abdominal pain I will order a repeat ultrasound of his gallbladder to ensure no gallstones suggest biliary colic.     Ernest Ronal BRAVO, MD 07/06/24 [phone]

## 2024-07-06 NOTE — H&P (Signed)
 History and Physical    Gabriel [name]. FMW:[id number] DOB: 03-22-1971 DOA: 07/06/2024  PCP: Franchot Isaiah LABOR, MD (Confirm with patient/family/NH records and if not entered, this has to be entered at [facility] point of entry) Patient coming from: Home  I have personally briefly reviewed patient's old medical records in [facility]  Chief Complaint: SOB, belly swells up and  pain  HPI: Gabriel [name]. is a 53 y.o. male with medical history significant of chronic combined HFrEF and HFpEF, pulmonary hypertension, COPD, nonobstructive CAD, status post ICD, PE on Xarelto , OSA on CPAP, morbid obesity, HTN, IDDM, cirrhosis secondary to NASH, CKD stage IIIa, presented with increasing shortness of breath and abdominal pain.  Symptoms started 4-5 days ago, patient started to notice intermittent  stretching like abdominal pain, worsening with torso movement.  He also noticed increasing girth of his abdomen and he gained more than 10 pounds in last 7 days.  He tried to increase his torsemide  dosage to twice daily with no significant improvement.  Yesterday he started develop wheezing and dry cough and increasing shortness of breath and could not lie flat last night because of shortness of breath.  Denies any chest pain no fever or chills.  No ankle edema.  ED Course: Afebrile, borderline tachycardia blood pressure elevated 170/97 95% on room air.  CT chest abdomen showed no acute findings, blood work showed BUN 18 creatinine 1.3 glucose 220, bicarb 19, WBC 9.2 hemoglobin 15.8.  Patient was given IV Lasix  60 mg x 1 in the ED.  Review of Systems: As per HPI otherwise 14 point review of systems negative.    Past Medical History:  Diagnosis Date   Anginal pain    Aortic atherosclerosis    Aortic root dilation 11/28/2016   a.) TTE 11/28/2016: 43 mm; b.) TTE 11/23/2019: 38 mm; c.) TTE 08/14/2022: 40 mm; d.) TTE 03/08/2023: 43 mm; e.) TTE 01/02/2024: 40 mm   Arteriosclerosis of right carotid  artery    a.) carotid doppler 04/29/2017: <50% RICA   Atrial fibrillation with RVR (HCC)    a.) CHA2DS2VASc = 6 (HFrEF, HTN, CVA x 2, vascular disease, T2DM) as of 01/27/2024; b.) s/p DCCV 11/06/2023 --> 200 J x 1 -->  NSR; c.) rate/rhythm maintained on oral amiodarone  + carvedilol ; chronically anticoagulated with rivaroxaban    CAD (coronary artery disease)    a.) MV 04/2015: low risk; b.) LHC 01/06/2017: minor irregs in LAD/Diag/LCX/OM, RCA 40p/m/d; c.) R/LHC 06/13/2020: 50% dLAD, 30% p-mLCx, 40% OM1, 100% pRCA (L-R collats) - med mgmt; d.) [facility] 08/19/2023: 50% dLAD, 30% p-mLCx, 40% OM1, 100% pRCA (L-R collats), 50% p-mRCA - med mgmt   Cardiomegaly    Cerebellar infarct (HCC)    a.) brain MRI 06/23/2023: tiny chronic infarct within the RIGHT cerebellar hemisphere; unsure of timing of neurological event; no deficits   Chest wall pain, chronic    Chronic 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    Chronic Troponin Elevation    CKD (chronic kidney disease), stage II-III    COPD (chronic obstructive pulmonary disease) (HCC)    CPAP (continuous positive airway pressure) dependence 03/08/2023   Depression    DM (diabetes mellitus), type 2 (HCC)    Dyspnea    GERD (gastroesophageal reflux disease)    Headache    HFrEF (heart failure with reduced ejection fraction) (HCC)    History of 2019 novel coronavirus disease (COVID-19) 03/01/2022   History of kidney stones    History of methicillin resistant staphylococcus aureus (MRSA)  Hyperlipidemia    Hypertension    ICD (implantable cardioverter-defibrillator) in place 05/20/2022   a.) LEFT midaxillary [employer] Emblem MRI S-ICD (SN: [phone])   Left rotator cuff tear    Liver cirrhosis secondary to NASH [facility])    Long term current use of amiodarone     Long term current use of immunosuppressive drug    a.) ixekizumab for psoriasis/psoriatic arthritis diagnosis   Long-term use of aspirin  therapy    Mediastinal lymphadenopathy    Mitral  regurgitation    Mild to moderate by October 2021 echocardiogram.   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 12/2015 noted on tele-->amio;  b. 12/2015 Event monitor: no VT noted.   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  12/31/19: RUL and RLL PEs.   Secondary polycythemia    a.) negative for JAK2 with reflex and BCR-ABL mutations; seeing hematology with etiology felt to be secondary to underlying COPD (quit smoking 04/2020), heart disease, OSAH, and obesity   Stroke [facility])    Syncope    a. 01/2016 - felt to be vasovagal.   Thrombocytopenia     Past Surgical History:  Procedure Laterality Date   CARDIOVERSION N/A 11/06/2023   Procedure: CARDIOVERSION;  Surgeon: Zenaida Morene PARAS, MD;  Location: [facility] ORS;  Service: Cardiovascular;  Laterality: N/A;   cardioverted 11/06/23     COLONOSCOPY WITH PROPOFOL  N/A 08/01/2022   Procedure: COLONOSCOPY WITH PROPOFOL ;  Surgeon: Unk Corinn Skiff, MD;  Location: [facility] ENDOSCOPY;  Service: Gastroenterology;  Laterality: N/A;   ESOPHAGOGASTRODUODENOSCOPY (EGD) WITH PROPOFOL  N/A 08/01/2022    Procedure: ESOPHAGOGASTRODUODENOSCOPY (EGD) WITH PROPOFOL ;  Surgeon: Unk Corinn Skiff, MD;  Location: [facility] ENDOSCOPY;  Service: Gastroenterology;  Laterality: N/A;   ESOPHAGOGASTRODUODENOSCOPY (EGD) WITH PROPOFOL  N/A 10/16/2022   Procedure: ESOPHAGOGASTRODUODENOSCOPY (EGD) WITH PROPOFOL ;  Surgeon: Unk Corinn Skiff, MD;  Location: [facility] ENDOSCOPY;  Service: Gastroenterology;  Laterality: N/A;   FINGER AMPUTATION Left    Traumatic   LEFT HEART CATH AND CORONARY ANGIOGRAPHY N/A 01/06/2017   Procedure: Left Heart Cath and Coronary Angiography;  Surgeon: Darron Deatrice LABOR, MD;  Location: [facility] INVASIVE CV LAB;  Service: Cardiovascular;  Laterality: N/A;   LESION EXCISION N/A 02/16/2024   Procedure: EXCISION, LESION, SCALP;  Surgeon: Rodolph Romano, MD;  Location: [facility] ORS;  Service: General;  Laterality: N/A;   RIGHT HEART CATH N/A 06/23/2024   Procedure: RIGHT HEART CATH;  Surgeon: Cherrie Toribio SAUNDERS, MD;  Location: [facility] INVASIVE CV LAB;  Service: Cardiovascular;  Laterality: N/A;  RIGHT/LEFT HEART CATH AND CORONARY ANGIOGRAPHY N/A 06/13/2020   Procedure: RIGHT/LEFT HEART CATH AND CORONARY ANGIOGRAPHY;  Surgeon: Mady Bruckner, MD;  Location: [facility] INVASIVE CV LAB;  Service: Cardiovascular;  Laterality: N/A;   RIGHT/LEFT HEART CATH AND CORONARY ANGIOGRAPHY Bilateral 08/19/2023   Procedure: RIGHT/LEFT HEART CATH AND CORONARY ANGIOGRAPHY;  Surgeon: Mady Bruckner, MD;  Location: [facility] INVASIVE CV LAB;  Service: Cardiovascular;  Laterality: Bilateral;   SHOULDER ARTHROSCOPY WITH SUBACROMIAL DECOMPRESSION, ROTATOR CUFF REPAIR AND BICEP TENDON REPAIR Left 09/16/2023   Procedure: SHOULDER ARTHROSCOPY WITH DEBRIDEMENT, DECOMPRESSION, ROTATOR CUFF REPAIR AND BICEPS TENODESIS.;  Surgeon: [name] Norleen PARAS, MD;  Location: [facility] ORS;  Service: Orthopedics;  Laterality: Left;   SUBQ ICD IMPLANT N/A 05/20/2022   Procedure: SUBQ ICD IMPLANT;  Surgeon: Cindie Ole DASEN, MD;  Location: [facility] INVASIVE CV LAB;   Service: Cardiovascular;  Laterality: N/A;     reports that he quit smoking about 4 years ago. His smoking use included cigarettes. He started smoking about 37 years ago. He has a 16.5 pack-year smoking history. He quit smokeless tobacco use about 4 years ago. He reports that he does not currently use alcohol. He reports that he does not use drugs.  Allergies  Allergen Reactions   Metformin  And Related Nausea And Vomiting   Ranitidine  Other (See Comments)   Zantac  [Ranitidine  Hcl] Diarrhea and Nausea Only    Night sweats    Family History  Problem Relation Age of Onset   Diabetes Mother    Diabetes Mellitus II Mother    Hypothyroidism Mother    Hypertension Mother    Kidney failure Mother        Dialysis   Heart attack Mother        19 yo approximately   Hypertension Father    Gout Father    Cancer Maternal Grandfather    Diabetes Maternal Grandfather    Cancer Paternal Aunt      Prior to Admission medications   Medication Sig Start Date End Date Taking? Authorizing Provider  albuterol  (VENTOLIN  HFA) 108 (90 Base) MCG/ACT inhaler Inhale 2 puffs into the lungs every 4 (four) hours as needed for wheezing or shortness of breath. 10/21/22  Yes Dgayli, Belva, MD  allopurinol  (ZYLOPRIM ) 300 MG tablet Take 300 mg by mouth every morning.   Yes [provider]  amiodarone  (PACERONE ) 200 MG tablet Take 1 tablet (200 mg total) by mouth daily. 01/14/24  Yes Cindie Ole DASEN, MD  aspirin  EC 81 MG tablet Take 81 mg by mouth every morning.   Yes [provider]  carvedilol  (COREG ) 3.125 MG tablet Take 1 tablet (3.125 mg total) by mouth 2 (two) times daily with a meal. 06/02/24  Yes Wellington Beams A, FNP  dapagliflozin  propanediol (FARXIGA ) 10 MG TABS tablet Take 1 tablet (10 mg total) by mouth daily. 07/07/23  Yes Hackney, Ellouise A, FNP  ezetimibe  (ZETIA ) 10 MG tablet Take 1 tablet (10 mg total) by mouth daily. 02/27/24  Yes Gollan, Timothy J, MD  Furosemide  (FUROSCIX ) 80  MG/10ML CTKT Inject 80 mg into the skin daily as needed. For weight above 250lbs from edema 04/13/24  Yes Donette Ellouise A, FNP  insulin  glargine (LANTUS  SOLOSTAR) 100 UNIT/ML Solostar Pen Inject 50 units into skin at bedtime. Decrease by 2 units every 2 days if fasting blood glucose is less than 90. 06/22/24  Yes Clifton, Beams A, FNP  insulin  lispro (HUMALOG ) 100 UNIT/ML KwikPen Inject 20 Units into the skin 3 (three) times daily. + CORRECTION  BEFORE BREAKFAST/LUNCH AND SUPPER (CORRECTION = 1 UNIT PER 50 MG/DL ABOVE 849); MAX DAILY DOSE 77 UNITS 06/14/24  Yes Clifton, Kellie A, FNP  ipratropium (ATROVENT) 0.06 % nasal spray Place 2 sprays into both nostrils every 8 (eight) hours. 06/15/24  Yes [provider]  ipratropium-albuterol  (DUONEB) 0.5-2.5 (3) MG/3ML SOLN Take 3 mLs by nebulization every 6 (six) hours as needed. 07/31/23  Yes Wellington Beams A, FNP  meclizine  (ANTIVERT ) 25 MG tablet Take 0.5 tablets (12.5 mg total) by mouth 3 (three) times daily as needed for dizziness. 09/16/23  Yes Poggi, Norleen PARAS, MD  nitroGLYCERIN  (NITROSTAT ) 0.4 MG SL tablet Place 1 tablet (0.4 mg total) under the tongue every 5 (five) minutes x 3 doses as needed for chest pain. 02/24/24  Yes Gollan, Timothy J, MD  omeprazole  (PRILOSEC) 40 MG capsule Take 1 capsule (40 mg total) by mouth daily. 06/17/24  Yes Wellington Beams LABOR, FNP  potassium chloride  SA (KLOR-CON  M) 20 MEQ tablet Take 1 tablet (20 mEq total) by mouth every other day. AM 02/24/24  Yes Gollan, Timothy J, MD  rivaroxaban  (XARELTO ) 20 MG TABS tablet Take 1 tablet (20 mg total) by mouth daily with supper. 07/01/23  Yes Bensimhon, Toribio SAUNDERS, MD  rosuvastatin  (CRESTOR ) 20 MG tablet Take 1 tablet (20 mg total) by mouth daily. 03/24/24  Yes Wellington Beams A, FNP  sacubitril -valsartan  (ENTRESTO ) 49-51 MG Take 1 tablet by mouth 2 (two) times daily. 06/28/24  Yes Hackney, Ellouise A, FNP  spironolactone  (ALDACTONE ) 25 MG tablet Take 25 mg by mouth daily.   Yes [provider]  tirzepatide  (MOUNJARO ) 12.5 MG/0.5ML Pen Inject 12.5 mg into the skin once a week. 06/02/24  Yes Wellington Beams A, FNP  Torsemide  40 MG TABS Take 40 mg by mouth daily. 02/24/24  Yes Gollan, Timothy J, MD  Accu-Chek Softclix Lancets lancets USE UP TO FOUR TIMES DAILY 07/10/22   [provider]  BD PEN NEEDLE NANO 2ND GEN 32G X 4 MM MISC USE  AS DIRECTED AT BEDTIME 02/03/23   Emilio Kelly DASEN, FNP  blood glucose meter kit and supplies KIT Dispense based on patient and insurance preference. Use up to four times daily as directed. 07/08/22   Emilio Kelly DASEN, FNP  ciclopirox  (PENLAC ) 8 % solution Apply topically at bedtime. Apply over nail and surrounding skin. Apply daily over previous coat. After seven (7) days, may remove with alcohol and continue cycle. Patient not taking: Reported on 07/06/2024 05/03/24   Wellington Beams LABOR, FNP  Continuous Glucose Sensor (FREESTYLE LIBRE 3 PLUS SENSOR) MISC Place 1 sensor on the skin every 15 days. Use to check glucose continuously 02/17/24   Wellington Beams LABOR, FNP  fenofibrate  (TRICOR ) 145 MG tablet Take 1 tablet (145 mg total) by mouth every evening. Patient not taking: Reported on 07/06/2024 02/24/24   Gollan, Timothy J, MD  Insulin  Pen Needle 32G X 4 MM MISC USE WITH INSULIN  IN THE MORNING, AT NOON, IN THE EVENING AND AT BEDTIME 04/13/24   Clifton, Kellie A, FNP  torsemide  (DEMADEX ) 20 MG tablet Take 20 mg by mouth daily. Patient not taking: Reported on 07/06/2024 02/24/24   [provider]    Physical Exam: Vitals:   07/06/24 0743 07/06/24 0800 07/06/24 0915 07/06/24 1036  BP:  (!) 179/102 (!) 159/96 (!) 174/97  Pulse:  (!) 101 (!) 108 (!) 103  Resp:  (!) 23 (!) 23   Temp:      TempSrc:  SpO2: 95% 93% 95%   Weight:      Height:        Constitutional: NAD, calm, comfortable Vitals:   07/06/24 0743 07/06/24 0800 07/06/24 0915 07/06/24 1036  BP:  (!) 179/102 (!) 159/96 (!) 174/97  Pulse:  (!) 101 (!) 108 (!) 103  Resp:   (!) 23 (!) 23   Temp:      TempSrc:      SpO2: 95% 93% 95%   Weight:      Height:       Eyes: PERRL, lids and conjunctivae normal ENMT: Mucous membranes are moist. Posterior pharynx clear of any exudate or lesions.Normal dentition.  Neck: normal, supple, no masses, no thyromegaly Respiratory: clear to auscultation bilaterally, no wheezing, fine crackles on bilateral lower fields, increasing respiratory effort. No accessory muscle use.  Cardiovascular: Regular rate and rhythm, no murmurs / rubs / gallops. No extremity edema. 2+ pedal pulses. No carotid bruits.  Abdomen: no tenderness, no masses palpated. No hepatosplenomegaly. Bowel sounds positive.  Musculoskeletal: no clubbing / cyanosis. No joint deformity upper and lower extremities. Good ROM, no contractures. Normal muscle tone.  Skin: no rashes, lesions, ulcers. No induration Neurologic: CN 2-12 grossly intact. Sensation intact, DTR normal. Strength 5/5 in all 4.  Psychiatric: Normal judgment and insight. Alert and oriented x 3. Normal mood.     Labs on Admission: I have personally reviewed following labs and imaging studies  CBC: Recent Labs  Lab 07/06/24 0525  WBC 9.3  HGB 15.8  HCT 48.9  MCV 85.6  PLT 169   Basic Metabolic Panel: Recent Labs  Lab 07/06/24 0525  NA 138  K 4.0  CL 104  CO2 19*  GLUCOSE 226*  BUN 18  CREATININE 1.34*  CALCIUM  9.7   GFR: Estimated Creatinine Clearance: 83.1 mL/min (A) (by C-G formula based on SCr of 1.34 mg/dL (H)). Liver Function Tests: Recent Labs  Lab 07/06/24 0525  AST 22  ALT 21  ALKPHOS 62  BILITOT 0.8  PROT 7.5  ALBUMIN  4.2   Recent Labs  Lab 07/06/24 0525  LIPASE 61*   No results for input(s): AMMONIA in the last 168 hours. Coagulation Profile: No results for input(s): INR, PROTIME in the last 168 hours. Cardiac Enzymes: No results for input(s): CKTOTAL, CKMB, CKMBINDEX, TROPONINI in the last 168 hours. BNP (last 3 results) Recent Labs     07/06/24 0613  PROBNP 2,488.0*   HbA1C: No results for input(s): HGBA1C in the last 72 hours. CBG: No results for input(s): GLUCAP in the last 168 hours. Lipid Profile: No results for input(s): CHOL, HDL, LDLCALC, TRIG, CHOLHDL, LDLDIRECT in the last 72 hours. Thyroid  Function Tests: No results for input(s): TSH, T4TOTAL, FREET4, T3FREE, THYROIDAB in the last 72 hours. Anemia Panel: No results for input(s): VITAMINB12, FOLATE, FERRITIN, TIBC, IRON, RETICCTPCT in the last 72 hours. Urine analysis:    Component Value Date/Time   COLORURINE STRAW (A) 07/06/2024 0850   APPEARANCEUR CLEAR (A) 07/06/2024 0850   APPEARANCEUR Clear 03/08/2020 0951   LABSPEC 1.018 07/06/2024 0850   LABSPEC 1.019 11/12/2012 1713   PHURINE 5.0 07/06/2024 0850   GLUCOSEU >=500 (A) 07/06/2024 0850   GLUCOSEU Negative 11/12/2012 1713   HGBUR SMALL (A) 07/06/2024 0850   BILIRUBINUR NEGATIVE 07/06/2024 0850   BILIRUBINUR Negative 03/08/2020 0951   BILIRUBINUR Negative 11/12/2012 1713   KETONESUR NEGATIVE 07/06/2024 0850   PROTEINUR 100 (A) 07/06/2024 0850   NITRITE NEGATIVE 07/06/2024 0850   LEUKOCYTESUR NEGATIVE 07/06/2024 0850  LEUKOCYTESUR Negative 11/12/2012 1713    Radiological Exams on Admission: US  ABDOMEN LIMITED RUQ (LIVER/GB) Result Date: 07/06/2024 CLINICAL DATA:  Right upper quadrant pain. EXAM: ULTRASOUND ABDOMEN LIMITED RIGHT UPPER QUADRANT COMPARISON:  Ultrasound 04/07/2024 and CT earlier today. FINDINGS: Gallbladder: No gallstones or wall thickening visualized. No sonographic Murphy sign noted by sonographer. Common bile duct: Diameter: 2.2 mm. Liver: Moderate hazy echotexture compatible with a degree of steatosis. No focal liver mass. Portal vein is patent on color Doppler imaging with normal direction of blood flow towards the liver. Other: No free fluid. IMPRESSION: 1. No acute findings. 2. Moderate hepatic steatosis. Electronically Signed   By: Toribio Agreste M.D.   On: 07/06/2024 10:29   CT Angio Chest/Abd/Pel for Dissection W and/or Wo Contrast Result Date: 07/06/2024 CLINICAL DATA:  Shortness of breath and hypertension. History of prior pulmonary embolism. Upper abdominal pain. EXAM: CT ANGIOGRAPHY CHEST, ABDOMEN AND PELVIS TECHNIQUE: Non-contrast CT of the chest was initially obtained. Multidetector CT imaging through the chest, abdomen and pelvis was performed using the standard protocol during bolus administration of intravenous contrast. Multiplanar reconstructed images and MIPs were obtained and reviewed to evaluate the vascular anatomy. RADIATION DOSE REDUCTION: This exam was performed according to the departmental dose-optimization program which includes automated exposure control, adjustment of the mA and/or kV according to patient size and/or use of iterative reconstruction technique. CONTRAST:  OMNIPAQUE  IOHEXOL  350 MG/ML SOLN COMPARISON:  CTA chest 11/03/2023, 05/14/2022, CT abdomen/pelvis 03/28/2021 FINDINGS: CTA CHEST FINDINGS Cardiovascular: Borderline stable cardiomegaly. Mild calcified plaque over the left main and 3 vessel coronary arteries. Thoracic aorta is normal caliber without aneurysm or dissection. Normal 3 vessel takeoff from the aortic arch. Pulmonary arterial system is unremarkable. Remaining vascular structures are unremarkable. Mediastinum/Nodes: 1.2 cm right paratracheal lymph node. 1.3 cm precarinal lymph node without significant change 1.6 cm right subcarinal lymph node (previously 1.3 cm). 1.2 cm left thyroid  nodule without significant change. Remaining mediastinal structures are unremarkable. Lungs/Pleura: Lungs are adequately inflated with tiny bilateral pleural effusions right larger than left with associated basilar atelectasis. Several small scattered bilateral calcified granulomas are present. Noncalcified 5 mm nodule over the left upper lobe (image 41) unchanged from September 2023 and therefore considered  benign. No acute lobar consolidation. Airways are normal. Musculoskeletal: No focal abnormality. Review of the MIP images confirms the above findings. CTA ABDOMEN AND PELVIS FINDINGS VASCULAR Aorta: Mild calcified plaque over the abdominal aorta. Abdominal aorta is normal in caliber without aneurysm or dissection. Celiac: Patent without evidence of aneurysm, dissection, vasculitis or significant stenosis. SMA: Patent without evidence of aneurysm, dissection, vasculitis or significant stenosis. Renals: Both renal arteries are patent without evidence of aneurysm, dissection, vasculitis, fibromuscular dysplasia or significant stenosis. Small accessory left renal artery just superior to the native artery. IMA: Patent without evidence of aneurysm, dissection, vasculitis or significant stenosis. Inflow: Patent without evidence of aneurysm, dissection, vasculitis or significant stenosis. Veins: No obvious venous abnormality within the limitations of this arterial phase study. Review of the MIP images confirms the above findings. NON-VASCULAR Hepatobiliary: Liver, gallbladder and biliary tree are normal. Pancreas: Normal. Spleen: Normal. Adrenals/Urinary Tract: Adrenal glands are normal. Kidneys are normal in size without hydronephrosis or nephrolithiasis. Mild cystic change over the kidneys which is stable. Ureters and bladder are normal. Stomach/Bowel: Mild gastric distension. Small bowel is normal. Appendix is normal. Colon is unremarkable. Lymphatic: No adenopathy. Reproductive: Prostate is unremarkable. Other: No free fluid or focal inflammatory change. Tiny umbilical hernia containing only peritoneal fat. Musculoskeletal:  No focal abnormality. Review of the MIP images confirms the above findings. IMPRESSION: 1. No acute findings in the chest, abdomen or pelvis. No evidence of thoracic or abdominal aortic aneurysm or dissection. 2. Tiny bilateral pleural effusions right larger than left with associated basilar  atelectasis. 3. Evidence of old granulomatous disease. 4. Mild mediastinal adenopathy likely reactive. 5. Aortic atherosclerosis. Atherosclerotic coronary artery disease. 6. Tiny umbilical hernia containing only peritoneal fat. Stable small renal cysts. Aortic Atherosclerosis (ICD10-I70.0). Electronically Signed   By: Toribio Agreste M.D.   On: 07/06/2024 08:49   DG Chest Portable 1 View Result Date: 07/06/2024 CLINICAL DATA:  Shortness of breath EXAM: PORTABLE CHEST 1 VIEW COMPARISON:  02/06/2024 FINDINGS: Left chest wall subcutaneous implanted defibrillator is again noted. Stable cardiomediastinal contours. Low lung volumes. No airspace consolidation, pleural effusion or edema. Visualized osseous structures appear intact. IMPRESSION: Low lung volumes. No acute findings. Electronically Signed   By: Waddell Calk M.D.   On: 07/06/2024 07:15    EKG: Independently reviewed.  Sinus rhythm, incomplete LBBB  Assessment/Plan Active Problems:   Acute on chronic systolic CHF (congestive heart failure) (HCC)   CHF (congestive heart failure) (HCC)  (please populate well all problems here in Problem List. (For example, if patient is on BP meds at home and you resume or decide to hold them, it is a problem that needs to be her. Same for CAD, COPD, HLD and so on)  Acute on chronic combined HFrEF and HFpEF decompensated History of ischemic cardiomyopathy Pulmonary hypertension Status post ICD - Decompensation of CHF likely secondary to nonadherence with CHF/BP medication as patient reported that he has not been taking his BP and CHF medications since yesterday.  He confirmed that he has been taking increased torsemide  instead. - Resume home BP meds - Increase Lasix  to 80 mg twice daily - Echocardiogram was done last than 6 months ago, and patient also had more recent RHC 2 weeks ago showed reduced cardiac output as well as increased of PCWP.  COPD - Probably stable, - Continue as needed DuoNebs  CAD  without angina - Continue aspirin  Coreg , Entresto , Farxiga  and Crestor   Morbid obesity - BMI= 37 - On GLP-1 agonist therapy  History of PE - Continue Xarelto   CKD stage IIIa - Creatinine level stable, volume status is overloaded - Diuresis as above  Polycythemia vera - H&H stable, outpatient follow-up with hematology.  IIDM Metabolic syndrome - Most recent A1c 8.8 -Resume home dose of Lantus  of two thirds dose, 10 units NovoLog  3 times daily AC plus sliding scale - Outpatient follow-up with endocrinology  NASH Liver cirrhosis - Stable, synthetic liver function preserved as albumin  level is normal  DVT prophylaxis: Xarelto  Code Status: Full code Family Communication: None at bedside Disposition Plan: Expect less than 2 midnight hospital stay Consults called: None Admission status: Telemetry observation   Cort ONEIDA Mana MD [facility] Pager [phone]  07/06/2024, 11:10 AM

## 2024-07-06 NOTE — Inpatient Diabetes Management (Signed)
 Inpatient Diabetes Program Recommendations  AACE/ADA: New Consensus Statement on Inpatient Glycemic Control (2015)  Target Ranges:  Prepandial:   less than 140 mg/dL      Peak postprandial:   less than 180 mg/dL (1-2 hours)      Critically ill patients:  140 - 180 mg/dL   Lab Results  Component Value Date   GLUCAP 281 (H) 07/06/2024   HGBA1C 8.8 (A) 04/02/2024    Review of Glycemic Control  Latest Reference Range & Units 07/06/24 12:09  Glucose-Capillary 70 - 99 mg/dL 718 (H)   Diabetes history: DM 2 Outpatient Diabetes medications:  Farxiga  10 mg daily Lantus  50 units q HS (reduce dose by 2 units if less than 90 mg/dL fasting) Humalog  20 units tid with meals  Mounjaro  12.5 mg weekly Current orders for Inpatient glycemic control:  Semglee  30 units daily Novolog  10 units tid with meals Novolog  0-20 units tid with meals and HS  Inpatient Diabetes Program Recommendations:    Agree with current orders- will follow.   Thanks,  Randall Bullocks, RN, BC-ADM Inpatient Diabetes Coordinator Pager [phone]  (8a-5p)

## 2024-07-06 NOTE — ED Notes (Signed)
 Pt requesting to be taken off of list with semi-private room. Called bed placement at this time to make them aware. Bed rejected. Pt remains on the list for a inpatient bed.

## 2024-07-06 NOTE — ED Provider Notes (Signed)
 [facility] Provider Note    Event Date/Time   First MD Initiated Contact with Patient 07/06/24 [phone]     (approximate)   History   Shortness of Breath and Abdominal Pain  Pt reports shortness of breath and abd pain for the past few days. Pt denies n/v/d. Pt reports slight cough. Pt reports he had heart cath done this past Wednesday.    HPI Gabriel [name]. is a 53 y.o. male PMH multiple medical comorbidities including COPD, diabetes, NSVT, NICM, CHF, AICD in place, CAD, prior CVA, CKD, prior PE and A-fib on Xarelto  presents for evaluation of shortness of breath, abdominal pain - Patient states he has been having gradually worsening upper abdominal pain over the past 3 weeks or so.  Was unable to sleep tonight due to pain which is why he presents to the emergency department.  Has also had progressively worsening shortness of breath. -No chest pain - He is taking all of his medications as prescribed including his Xarelto  - Appears patient had a right heart cath on 06/23/2024 - No leg swelling but notes that he usually accumulates fluid in his abdomen.  Does feel his abdomen is somewhat distended today.   Per chart review, last echo 01/02/2024, EF 35-40%.  No significant valvular pathology.     Physical Exam   Triage Vital Signs: ED Triage Vitals  Encounter Vitals Group     BP 07/06/24 0523 (!) 184/107     Girls Systolic BP Percentile --      Girls Diastolic BP Percentile --      Boys Systolic BP Percentile --      Boys Diastolic BP Percentile --      Pulse Rate 07/06/24 0523 99     Resp 07/06/24 0523 (!) 22     Temp --      Temp src --      SpO2 07/06/24 0523 97 %     Weight 07/06/24 0522 260 lb (117.9 kg)     Height 07/06/24 0522 5' 11 (1.803 m)     Head Circumference --      Peak Flow --      Pain Score 07/06/24 0522 8     Pain Loc --      Pain Education --      Exclude from Growth Chart --     Most recent vital signs: Vitals:    07/06/24 0601 07/06/24 0617  BP:  (!) 176/101  Pulse:  96  Resp:  14  SpO2: 96% 98%     General: Awake, appears uncomfortable, dyspneic CV:  Good peripheral perfusion. RRR, RP 2+ Resp:  Normal effort.  Some end expiratory wheezing in bilateral lower lung fields, mildly coarse breath sounds bilateral lower lung fields Abd:  Somewhat distention.  Moderate tenderness to palpation throughout upper abdomen. Other:  Lower extremity edema   ED Results / Procedures / Treatments   Labs (all labs ordered are listed, but only abnormal results are displayed) Labs Reviewed  LIPASE, BLOOD - Abnormal; Notable for the following components:      Result Value   Lipase 61 (*)    All other components within normal limits  COMPREHENSIVE METABOLIC PANEL WITH GFR - Abnormal; Notable for the following components:   CO2 19 (*)    Glucose, Bld 226 (*)    Creatinine, Ser 1.34 (*)    All other components within normal limits  PRO BRAIN NATRIURETIC PEPTIDE - Abnormal; Notable for the  following components:   Pro Brain Natriuretic Peptide 2,488.0 (*)    All other components within normal limits  TROPONIN T, HIGH SENSITIVITY - Abnormal; Notable for the following components:   Troponin T High Sensitivity 42 (*)    All other components within normal limits  RESP PANEL BY RT-PCR (RSV, FLU A&B, COVID)  RVPGX2  CBC  URINALYSIS, ROUTINE W REFLEX MICROSCOPIC  TROPONIN T, HIGH SENSITIVITY     EKG  See ED course below   RADIOLOGY Radiology interpreted myself and radiology report reviewed.  No acute pathology identified.  CTA dissection protocol pending.   PROCEDURES:  Critical Care performed: No  Procedures   MEDICATIONS ORDERED IN ED: Medications  iohexol  (OMNIPAQUE ) 350 MG/ML injection 100 mL (has no administration in time range)  furosemide  (LASIX ) injection 60 mg (has no administration in time range)  pantoprazole  (PROTONIX ) injection 40 mg (40 mg Intravenous Given 07/06/24 0623)   morphine  (PF) 2 MG/ML injection 2 mg (2 mg Intravenous Given 07/06/24 0620)  alum & mag hydroxide-simeth (MAALOX/MYLANTA) 200-200-20 MG/5ML suspension 30 mL (30 mLs Oral Given 07/06/24 0604)    And  lidocaine  (XYLOCAINE ) 2 % viscous mouth solution 15 mL (15 mLs Oral Given 07/06/24 0604)  ipratropium-albuterol  (DUONEB) 0.5-2.5 (3) MG/3ML nebulizer solution 3 mL (3 mLs Nebulization Given 07/06/24 0606)  ipratropium-albuterol  (DUONEB) 0.5-2.5 (3) MG/3ML nebulizer solution 3 mL (3 mLs Nebulization Given 07/06/24 0605)  methylPREDNISolone  sodium succinate (SOLU-MEDROL ) 125 mg/2 mL injection 125 mg (125 mg Intravenous Given 07/06/24 0624)     IMPRESSION / MDM / ASSESSMENT AND PLAN / ED COURSE  I reviewed the triage vital signs and the nursing notes.                              DDX/MDM/AP: Differential diagnosis includes, but is not limited to, ACS, cholecystitis, pancreatitis, PUD, dyspepsia, CHF exacerbation, suspect some element of COPD exacerbation contributing.  Plan: - Labs - EKG - Cardiac monitor - Chest x-ray - DuoNebs, steroids -GI cocktail, morphine  - CT abdomen pelvis  Patient's presentation is most consistent with acute presentation with potential threat to life or bodily function.  The patient is on the cardiac monitor to evaluate for evidence of arrhythmia and/or significant heart rate changes.  ED course below.  Laboratory workup notable for elevated proBNP, chest x-ray with no obvious underlying vascular congestion, difficult to compare new proBNP versus prior standard BNP.  Troponin mildly elevated, suspect type II NSTEMI, EKG nonischemic, no frank chest pain.  Signed out to oncoming ED provider pending CTA dissection protocol and repeat troponin.  Recent started.  Clinical Course as of 07/06/24 0736  Tue Jul 06, 2024  0546 Cbc wnl [MM]  [phone] CMP reviewed, overall unremarkable, creatinine improved from prior [MM]  0557 Lipase unremarkable [MM]  0636 Viral swab neg  [MM]  0636 Ecg = sinus rhythm, rate 97, no gross ST elevation or depression, no significant repolarization abnormality, right axis deviation, borderline prolonged QTc.  No clear evidence of ischemia no arrhythmia my interpretation. [MM]  0734 Reevaluated, feeling better after DuoNeb treatment.  Some persistent upper abdominal discomfort.  CT pending.  Repeat exam with resolved wheezing. [MM]    Clinical Course User Index [MM] Clarine Ozell LABOR, MD     FINAL CLINICAL IMPRESSION(S) / ED DIAGNOSES   Final diagnoses:  Upper abdominal pain  Shortness of breath     Rx / DC Orders   ED Discharge Orders  None        Note:  This document was prepared using Dragon voice recognition software and may include unintentional dictation errors.   Clarine Ozell LABOR, MD 07/06/24 [phone]

## 2024-07-06 NOTE — ED Notes (Signed)
 Pt placed on monitor.

## 2024-07-06 NOTE — ED Triage Notes (Addendum)
 Pt reports shortness of breath and abd pain for the past few days. Pt denies n/v/d. Pt reports slight cough. Pt reports he had heart cath done this past Wednesday.

## 2024-07-06 NOTE — ED Notes (Signed)
 Pt ambulated in room on room air. Prior to ambulation pt's SPO2 95%. Pt SPO2 86% after ambulating.

## 2024-07-07 ENCOUNTER — Encounter (HOSPITAL_COMMUNITY): Payer: Self-pay

## 2024-07-07 ENCOUNTER — Other Ambulatory Visit: Payer: Self-pay

## 2024-07-07 DIAGNOSIS — I272 Pulmonary hypertension, unspecified: Secondary | ICD-10-CM | POA: Diagnosis present

## 2024-07-07 DIAGNOSIS — D45 Polycythemia vera: Secondary | ICD-10-CM

## 2024-07-07 DIAGNOSIS — I5023 Acute on chronic systolic (congestive) heart failure: Secondary | ICD-10-CM | POA: Diagnosis not present

## 2024-07-07 DIAGNOSIS — Z86711 Personal history of pulmonary embolism: Secondary | ICD-10-CM

## 2024-07-07 DIAGNOSIS — J439 Emphysema, unspecified: Secondary | ICD-10-CM

## 2024-07-07 DIAGNOSIS — R1900 Intra-abdominal and pelvic swelling, mass and lump, unspecified site: Secondary | ICD-10-CM

## 2024-07-07 DIAGNOSIS — E876 Hypokalemia: Secondary | ICD-10-CM | POA: Diagnosis present

## 2024-07-07 DIAGNOSIS — N179 Acute kidney failure, unspecified: Secondary | ICD-10-CM | POA: Diagnosis not present

## 2024-07-07 DIAGNOSIS — E1169 Type 2 diabetes mellitus with other specified complication: Secondary | ICD-10-CM

## 2024-07-07 DIAGNOSIS — K7581 Nonalcoholic steatohepatitis (NASH): Secondary | ICD-10-CM | POA: Diagnosis present

## 2024-07-07 DIAGNOSIS — N1831 Chronic kidney disease, stage 3a: Secondary | ICD-10-CM | POA: Diagnosis present

## 2024-07-07 DIAGNOSIS — E1122 Type 2 diabetes mellitus with diabetic chronic kidney disease: Secondary | ICD-10-CM | POA: Diagnosis present

## 2024-07-07 DIAGNOSIS — E785 Hyperlipidemia, unspecified: Secondary | ICD-10-CM

## 2024-07-07 DIAGNOSIS — Z8616 Personal history of COVID-19: Secondary | ICD-10-CM | POA: Diagnosis not present

## 2024-07-07 DIAGNOSIS — I5043 Acute on chronic combined systolic (congestive) and diastolic (congestive) heart failure: Secondary | ICD-10-CM | POA: Diagnosis present

## 2024-07-07 DIAGNOSIS — I25118 Atherosclerotic heart disease of native coronary artery with other forms of angina pectoris: Secondary | ICD-10-CM | POA: Diagnosis not present

## 2024-07-07 DIAGNOSIS — R0602 Shortness of breath: Secondary | ICD-10-CM | POA: Diagnosis present

## 2024-07-07 DIAGNOSIS — E872 Acidosis, unspecified: Secondary | ICD-10-CM | POA: Diagnosis present

## 2024-07-07 DIAGNOSIS — I472 Ventricular tachycardia, unspecified: Secondary | ICD-10-CM | POA: Diagnosis present

## 2024-07-07 DIAGNOSIS — Z794 Long term (current) use of insulin: Secondary | ICD-10-CM | POA: Diagnosis not present

## 2024-07-07 DIAGNOSIS — I480 Paroxysmal atrial fibrillation: Secondary | ICD-10-CM

## 2024-07-07 DIAGNOSIS — E66812 Obesity, class 2: Secondary | ICD-10-CM | POA: Diagnosis present

## 2024-07-07 DIAGNOSIS — F1021 Alcohol dependence, in remission: Secondary | ICD-10-CM | POA: Diagnosis present

## 2024-07-07 DIAGNOSIS — Z7901 Long term (current) use of anticoagulants: Secondary | ICD-10-CM | POA: Diagnosis not present

## 2024-07-07 DIAGNOSIS — J449 Chronic obstructive pulmonary disease, unspecified: Secondary | ICD-10-CM | POA: Diagnosis present

## 2024-07-07 DIAGNOSIS — E1165 Type 2 diabetes mellitus with hyperglycemia: Secondary | ICD-10-CM | POA: Diagnosis present

## 2024-07-07 DIAGNOSIS — I130 Hypertensive heart and chronic kidney disease with heart failure and stage 1 through stage 4 chronic kidney disease, or unspecified chronic kidney disease: Secondary | ICD-10-CM | POA: Diagnosis present

## 2024-07-07 DIAGNOSIS — E669 Obesity, unspecified: Secondary | ICD-10-CM

## 2024-07-07 DIAGNOSIS — R570 Cardiogenic shock: Secondary | ICD-10-CM | POA: Diagnosis not present

## 2024-07-07 DIAGNOSIS — K7460 Unspecified cirrhosis of liver: Secondary | ICD-10-CM | POA: Diagnosis present

## 2024-07-07 DIAGNOSIS — I428 Other cardiomyopathies: Secondary | ICD-10-CM | POA: Diagnosis present

## 2024-07-07 LAB — BASIC METABOLIC PANEL WITH GFR
Anion gap: 14 (ref 5–15)
BUN: 28 mg/dL — ABNORMAL HIGH (ref 6–20)
CO2: 26 mmol/L (ref 22–32)
Calcium: 10.1 mg/dL (ref 8.9–10.3)
Chloride: 98 mmol/L (ref 98–111)
Creatinine, Ser: 1.52 mg/dL — ABNORMAL HIGH (ref 0.61–1.24)
GFR, Estimated: 54 mL/min — ABNORMAL LOW (ref >60)
Glucose, Bld: 215 mg/dL — ABNORMAL HIGH (ref 70–99)
Potassium: 3.8 mmol/L (ref 3.5–5.1)
Sodium: 138 mmol/L (ref 135–145)

## 2024-07-07 LAB — GLUCOSE, CAPILLARY
Glucose-Capillary: 218 mg/dL — ABNORMAL HIGH (ref 70–99)
Glucose-Capillary: 231 mg/dL — ABNORMAL HIGH (ref 70–99)
Glucose-Capillary: 259 mg/dL — ABNORMAL HIGH (ref 70–99)
Glucose-Capillary: 288 mg/dL — ABNORMAL HIGH (ref 70–99)

## 2024-07-07 LAB — HIV ANTIBODY (ROUTINE TESTING W REFLEX): HIV Screen 4th Generation wRfx: NONREACTIVE

## 2024-07-07 MED ORDER — FUROSEMIDE 10 MG/ML IJ SOLN
80.0000 mg | Freq: Three times a day (TID) | INTRAMUSCULAR | Status: DC
  Administered 2024-07-07 – 2024-07-08 (×3): 80 mg via INTRAVENOUS
  Filled 2024-07-07 (×3): qty 8

## 2024-07-07 MED ORDER — POTASSIUM CHLORIDE CRYS ER 20 MEQ PO TBCR
40.0000 meq | EXTENDED_RELEASE_TABLET | Freq: Once | ORAL | Status: AC
  Administered 2024-07-07: 40 meq via ORAL
  Filled 2024-07-07: qty 2

## 2024-07-07 MED ORDER — EZETIMIBE 10 MG PO TABS
10.0000 mg | ORAL_TABLET | Freq: Every day | ORAL | Status: DC
  Administered 2024-07-07 – 2024-07-14 (×8): 10 mg via ORAL
  Filled 2024-07-07 (×8): qty 1

## 2024-07-07 MED ORDER — SPIRONOLACTONE 25 MG PO TABS
25.0000 mg | ORAL_TABLET | Freq: Every day | ORAL | Status: DC
  Administered 2024-07-07 – 2024-07-09 (×3): 25 mg via ORAL
  Filled 2024-07-07 (×3): qty 1

## 2024-07-07 MED ORDER — DAPAGLIFLOZIN PROPANEDIOL 10 MG PO TABS
10.0000 mg | ORAL_TABLET | Freq: Every day | ORAL | Status: DC
  Administered 2024-07-07 – 2024-07-14 (×7): 10 mg via ORAL
  Filled 2024-07-07 (×9): qty 1

## 2024-07-07 NOTE — Inpatient Diabetes Management (Signed)
 Inpatient Diabetes Program Recommendations  AACE/ADA: New Consensus Statement on Inpatient Glycemic Control (2015)  Target Ranges:  Prepandial:   less than 140 mg/dL      Peak postprandial:   less than 180 mg/dL (1-2 hours)      Critically ill patients:  140 - 180 mg/dL    Latest Reference Range & Units 07/06/24 12:09 07/06/24 16:48 07/06/24 21:05  Glucose-Capillary 70 - 99 mg/dL 718 (H) 714 (H) 746 (H)  (H): Data is abnormally high  Latest Reference Range & Units 07/07/24 08:46 07/07/24 12:10  Glucose-Capillary 70 - 99 mg/dL 781 (H)  17 units Novolog  @1018   30 units Semglee  231 (H)  17 units Novolog  @1331   (H): Data is abnormally high     Home DM Meds: Farxiga  10 mg daily Lantus  50 units q HS (reduce dose by 2 units if less than 90 mg/dL fasting) Humalog  20 units tid with meals  Mounjaro  12.5 mg weekly   Current Orders: Semglee  30 units daily Novolog  10 units tid with meals Novolog  0-20 units tid with meals and HS Farxiga  10 mg daily    MD- May consider:  1. Increase the Semglee  to 35 units daily  2. Increase the Novolog  Meal Coverage to 15 units TID (takes 20 units TID at home)   --Will follow patient during hospitalization--  Adina Rudolpho Arrow RN, MSN, CDCES Diabetes Coordinator Inpatient Glycemic Control Team Team Pager: [phone] (8a-5p)

## 2024-07-07 NOTE — Plan of Care (Signed)
  Problem: Coping: Goal: Ability to adjust to condition or change in health will improve Outcome: Progressing   Problem: Nutritional: Goal: Maintenance of adequate nutrition will improve Outcome: Progressing   Problem: Skin Integrity: Goal: Risk for impaired skin integrity will decrease Outcome: Progressing   Problem: Clinical Measurements: Goal: Ability to maintain clinical measurements within normal limits will improve Outcome: Progressing Goal: Will remain free from infection Outcome: Progressing Goal: Respiratory complications will improve Outcome: Progressing Goal: Cardiovascular complication will be avoided Outcome: Progressing   Problem: Nutrition: Goal: Adequate nutrition will be maintained Outcome: Progressing   Problem: Elimination: Goal: Will not experience complications related to urinary retention Outcome: Progressing

## 2024-07-07 NOTE — Care Management Obs Status (Signed)
 MEDICARE OBSERVATION STATUS NOTIFICATION   Patient Details  Name: Gabriel [name]. MRN: [id number] Date of Birth: 03/23/1971   Medicare Observation Status Notification Given:  Yes    [name] 07/07/2024, 12:36 PM

## 2024-07-07 NOTE — Assessment & Plan Note (Addendum)
 BMI 35.73, class II obesity.

## 2024-07-07 NOTE — Assessment & Plan Note (Signed)
 Outpatient follow up.

## 2024-07-07 NOTE — Assessment & Plan Note (Addendum)
 Holding spironolactone

## 2024-07-07 NOTE — Assessment & Plan Note (Signed)
 Today's creatinine 1.52 with a GFR 54

## 2024-07-07 NOTE — Assessment & Plan Note (Addendum)
 Continue milrinone  drip.  Lasix  held yesterday with creatinine elevation.  Holding Entresto  today.  Holding spironolactone .  Holding low-dose Coreg  (while on milrinone ).  Patient on Farxiga .  Last EF 35 to 40%.  Cardiology added hydralazine  yesterday.

## 2024-07-07 NOTE — Progress Notes (Signed)
 RN made Dr. Josette aware of 4 beat run of vtach this morning and vtach run over night. MD acknowledged.

## 2024-07-07 NOTE — Plan of Care (Signed)

## 2024-07-07 NOTE — Consult Note (Signed)
 Cardiology Consultation   Patient ID: Gabriel [name]. MRN: [id number]; DOB: March 14, 1971  Admit date: 07/06/2024 Date of Consult: 07/07/2024  PCP:  Franchot Isaiah LABOR, MD   [facility] Providers Cardiologist:  [name], MD  Electrophysiologist:  [name], MD  Electrophysiology APP:  Riddle, Suzann, NP  Physician requesting consult: Dr. Josette Reason for consult: Acute on chronic diastolic and systolic CHF   Patient Profile: Gabriel [name]. is a 53 y.o. male with a hx of ischemic and nonischemic cardiomyopathy, frequent hospital admissions for acute diastolic and systolic CHF, coronary artery disease, occluded RCA with collaterals,  COPD/long smoking history, paroxysmal atrial fibrillation, pulmonary hypertension, ICD, presenting with worsening weight gain and shortness of breath  History of Present Illness: [name] is followed by the CHF clinic Reports compliance with his torsemide  80 daily Despite taking his medication in a consistent manner, he reports having 30 pound weight gain approximately from 230 up to 260 or higher, abdominal distention, worsening shortness of breath.  He had weight gain on evaluation by CHF clinic, was given several doses of metolazone  which he reports did not augment his urine output after 3 doses.  Denies excessive fluid intake  On arrival to the emergency room proBNP 2400, troponin 42, chest x-ray nonacute  Echo May 2025 EF 35 to 40%  Recent catheterization November 2025 RA of 3, PA 34/15 (24), CO 4.3 + CI 1.8 via fick, CO 4.8 + CI 2 via thermo, PCWP 22, PAPi 6.3.   After several doses IV Lasix  so far he reports abdomen is getting a little bit softer but still markedly distended  Past Medical History:  Diagnosis Date   Anginal pain    Aortic atherosclerosis    Aortic root dilation 11/28/2016   a.) TTE 11/28/2016: 43 mm; b.) TTE 11/23/2019: 38 mm; c.) TTE 08/14/2022: 40 mm; d.) TTE 03/08/2023: 43 mm; e.) TTE  01/02/2024: 40 mm   Arteriosclerosis of right carotid artery    a.) carotid doppler 04/29/2017: <50% RICA   Atrial fibrillation with RVR (HCC)    a.) CHA2DS2VASc = 6 (HFrEF, HTN, CVA x 2, vascular disease, T2DM) as of 01/27/2024; b.) s/p DCCV 11/06/2023 --> 200 J x 1 -->  NSR; c.) rate/rhythm maintained on oral amiodarone  + carvedilol ; chronically anticoagulated with rivaroxaban    CAD (coronary artery disease)    a.) MV 04/2015: low risk; b.) LHC 01/06/2017: minor irregs in LAD/Diag/LCX/OM, RCA 40p/m/d; c.) R/LHC 06/13/2020: 50% dLAD, 30% p-mLCx, 40% OM1, 100% pRCA (L-R collats) - med mgmt; d.) [facility] 08/19/2023: 50% dLAD, 30% p-mLCx, 40% OM1, 100% pRCA (L-R collats), 50% p-mRCA - med mgmt   Cardiomegaly    Cerebellar infarct (HCC)    a.) brain MRI 06/23/2023: tiny chronic infarct within the RIGHT cerebellar hemisphere; unsure of timing of neurological event; no deficits   Chest wall pain, chronic    Chronic 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    Chronic Troponin Elevation    CKD (chronic kidney disease), stage II-III    COPD (chronic obstructive pulmonary disease) (HCC)    CPAP (continuous positive airway pressure) dependence 03/08/2023   Depression    DM (diabetes mellitus), type 2 (HCC)    Dyspnea    GERD (gastroesophageal reflux disease)    Headache    HFrEF (heart failure with reduced ejection fraction) (HCC)    History of 2019 novel coronavirus disease (COVID-19) 03/01/2022   History of kidney stones    History of methicillin resistant staphylococcus aureus (MRSA)  Hyperlipidemia    Hypertension    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 12/2015 noted on tele-->amio;  b. 12/2015 Event monitor: no VT noted.   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  12/31/19: RUL and RLL PEs.   Secondary polycythemia    a.) negative for JAK2 with reflex and BCR-ABL mutations; seeing hematology with etiology felt to be secondary to underlying COPD (quit smoking 04/2020), heart disease, OSAH, and obesity   Stroke [facility])    Syncope    a. 01/2016 - felt to be vasovagal.   Thrombocytopenia     Past Surgical History:  Procedure Laterality Date   CARDIOVERSION N/A 11/06/2023   Procedure: CARDIOVERSION;  Surgeon: Zenaida Morene PARAS, MD;  Location: [facility] ORS;  Service: Cardiovascular;  Laterality: N/A;   cardioverted 11/06/23     COLONOSCOPY WITH PROPOFOL  N/A 08/01/2022   Procedure: COLONOSCOPY WITH PROPOFOL ;  Surgeon: Unk Corinn Skiff, MD;  Location: [facility] ENDOSCOPY;  Service: Gastroenterology;  Laterality: N/A;    ESOPHAGOGASTRODUODENOSCOPY (EGD) WITH PROPOFOL  N/A 08/01/2022   Procedure: ESOPHAGOGASTRODUODENOSCOPY (EGD) WITH PROPOFOL ;  Surgeon: Unk Corinn Skiff, MD;  Location: [facility] ENDOSCOPY;  Service: Gastroenterology;  Laterality: N/A;   ESOPHAGOGASTRODUODENOSCOPY (EGD) WITH PROPOFOL  N/A 10/16/2022   Procedure: ESOPHAGOGASTRODUODENOSCOPY (EGD) WITH PROPOFOL ;  Surgeon: Unk Corinn Skiff, MD;  Location: [facility] ENDOSCOPY;  Service: Gastroenterology;  Laterality: N/A;   FINGER AMPUTATION Left    Traumatic   LEFT HEART CATH AND CORONARY ANGIOGRAPHY N/A 01/06/2017   Procedure: Left Heart Cath and Coronary Angiography;  Surgeon: Darron Deatrice LABOR, MD;  Location: [facility] INVASIVE CV LAB;  Service: Cardiovascular;  Laterality: N/A;   LESION EXCISION N/A 02/16/2024   Procedure: EXCISION, LESION, SCALP;  Surgeon: Rodolph Romano, MD;  Location: [facility] ORS;  Service: General;  Laterality: N/A;   RIGHT HEART CATH N/A 06/23/2024   Procedure: RIGHT HEART CATH;  Surgeon: Cherrie Toribio SAUNDERS, MD;  Location: [facility] INVASIVE CV LAB;  Service: Cardiovascular;  Laterality: N/A;  RIGHT/LEFT HEART CATH AND CORONARY ANGIOGRAPHY N/A 06/13/2020   Procedure: RIGHT/LEFT HEART CATH AND CORONARY ANGIOGRAPHY;  Surgeon: Mady Bruckner, MD;  Location: [facility] INVASIVE CV LAB;  Service: Cardiovascular;  Laterality: N/A;   RIGHT/LEFT HEART CATH AND CORONARY ANGIOGRAPHY Bilateral 08/19/2023   Procedure: RIGHT/LEFT HEART CATH AND CORONARY ANGIOGRAPHY;  Surgeon: Mady Bruckner, MD;  Location: [facility] INVASIVE CV LAB;  Service: Cardiovascular;  Laterality: Bilateral;   SHOULDER ARTHROSCOPY WITH SUBACROMIAL DECOMPRESSION, ROTATOR CUFF REPAIR AND BICEP TENDON REPAIR Left 09/16/2023   Procedure: SHOULDER ARTHROSCOPY WITH DEBRIDEMENT, DECOMPRESSION, ROTATOR CUFF REPAIR AND BICEPS TENODESIS.;  Surgeon: [name] Norleen PARAS, MD;  Location: [facility] ORS;  Service: Orthopedics;  Laterality: Left;   SUBQ ICD IMPLANT N/A 05/20/2022   Procedure: SUBQ ICD IMPLANT;   Surgeon: Cindie Ole DASEN, MD;  Location: [facility] INVASIVE CV LAB;  Service: Cardiovascular;  Laterality: N/A;     Home Medications:  Prior to Admission medications   Medication Sig Start Date End Date Taking? Authorizing Provider  albuterol  (VENTOLIN  HFA) 108 (90 Base) MCG/ACT inhaler Inhale 2 puffs into the lungs every 4 (four) hours as needed for wheezing or shortness of breath. 10/21/22  Yes Dgayli, Belva, MD  allopurinol  (ZYLOPRIM ) 300 MG tablet Take 300 mg by mouth every morning.   Yes [provider]  amiodarone  (PACERONE ) 200 MG tablet Take 1 tablet (200 mg total) by mouth daily. 01/14/24  Yes Cindie Ole DASEN, MD  aspirin  EC 81 MG tablet Take 81 mg by mouth every morning.   Yes [provider]  carvedilol  (COREG ) 3.125 MG tablet Take 1 tablet (3.125 mg total) by mouth 2 (two) times daily with a meal. 06/02/24  Yes Wellington Beams A, FNP  dapagliflozin  propanediol (FARXIGA ) 10 MG TABS tablet Take 1 tablet (10 mg total) by mouth daily. 07/07/23  Yes Hackney, Ellouise A, FNP  ezetimibe  (ZETIA ) 10 MG tablet Take 1 tablet (10 mg total) by mouth daily. 02/27/24  Yes Vontae Court J, MD  Furosemide  (FUROSCIX ) 80 MG/10ML CTKT Inject 80 mg into the skin daily as needed. For weight above 250lbs from edema 04/13/24  Yes Hackney, Ellouise LABOR, FNP  insulin  glargine (LANTUS  SOLOSTAR) 100 UNIT/ML Solostar Pen Inject 50 units into skin at bedtime. Decrease by 2 units every 2 days if fasting blood glucose is less than 90. 06/22/24  Yes Clifton, Beams A, FNP  insulin  lispro (HUMALOG ) 100 UNIT/ML KwikPen Inject 20 Units into the skin 3 (three) times daily. + CORRECTION BEFORE BREAKFAST/LUNCH AND SUPPER (CORRECTION = 1 UNIT PER 50 MG/DL ABOVE 849); MAX DAILY DOSE 77 UNITS 06/14/24  Yes Clifton, Kellie A, FNP  ipratropium (ATROVENT) 0.06 % nasal spray Place 2 sprays into both nostrils every 8 (eight) hours. 06/15/24  Yes [provider]  ipratropium-albuterol  (DUONEB) 0.5-2.5 (3) MG/3ML SOLN Take  3 mLs by nebulization every 6 (six) hours as needed. 07/31/23  Yes Wellington Beams A, FNP  meclizine  (ANTIVERT ) 25 MG tablet Take 0.5 tablets (12.5 mg total) by mouth 3 (three) times daily as needed for dizziness. 09/16/23  Yes Poggi, Norleen PARAS, MD  nitroGLYCERIN  (NITROSTAT ) 0.4 MG SL tablet Place 1 tablet (0.4 mg total) under the tongue every 5 (five) minutes x 3 doses as needed for chest pain. 02/24/24  Yes Lene Mckay J, MD  omeprazole  (PRILOSEC) 40 MG capsule Take 1 capsule (40 mg total) by mouth daily. 06/17/24  Yes Clifton, Kellie A, FNP  potassium chloride  SA (KLOR-CON  M) 20 MEQ tablet Take 1 tablet (20 mEq total) by mouth every  other day. AM 02/24/24  Yes Aariel Ems J, MD  rivaroxaban  (XARELTO ) 20 MG TABS tablet Take 1 tablet (20 mg total) by mouth daily with supper. 07/01/23  Yes Bensimhon, Toribio SAUNDERS, MD  rosuvastatin  (CRESTOR ) 20 MG tablet Take 1 tablet (20 mg total) by mouth daily. 03/24/24  Yes [name] LABOR, FNP  sacubitril -valsartan  (ENTRESTO ) 49-51 MG Take 1 tablet by mouth 2 (two) times daily. 06/28/24  Yes Hackney, Ellouise A, FNP  spironolactone  (ALDACTONE ) 25 MG tablet Take 25 mg by mouth daily.   Yes [provider]  tirzepatide  (MOUNJARO ) 12.5 MG/0.5ML Pen Inject 12.5 mg into the skin once a week. 06/02/24  Yes [name] A, FNP  Torsemide  40 MG TABS Take 40 mg by mouth daily. 02/24/24  Yes Benjamine Strout J, MD  Accu-Chek Softclix Lancets lancets USE UP TO FOUR TIMES DAILY 07/10/22   [provider]  BD PEN NEEDLE NANO 2ND GEN 32G X 4 MM MISC USE  AS DIRECTED AT BEDTIME 02/03/23   Emilio Kelly DASEN, FNP  blood glucose meter kit and supplies KIT Dispense based on patient and insurance preference. Use up to four times daily as directed. 07/08/22   Emilio Kelly DASEN, FNP  ciclopirox  (PENLAC ) 8 % solution Apply topically at bedtime. Apply over nail and surrounding skin. Apply daily over previous coat. After seven (7) days, may remove with alcohol and continue  cycle. Patient not taking: Reported on 07/06/2024 05/03/24   [name] LABOR, FNP  Continuous Glucose Sensor (FREESTYLE LIBRE 3 PLUS SENSOR) MISC Place 1 sensor on the skin every 15 days. Use to check glucose continuously 02/17/24   [name] A, FNP  fenofibrate  (TRICOR ) 145 MG tablet Take 1 tablet (145 mg total) by mouth every evening. Patient not taking: Reported on 07/06/2024 02/24/24   Dayjah Selman J, MD  Insulin  Pen Needle 32G X 4 MM MISC USE WITH INSULIN  IN THE MORNING, AT NOON, IN THE EVENING AND AT BEDTIME 04/13/24   [name] A, FNP  torsemide  (DEMADEX ) 20 MG tablet Take 20 mg by mouth daily. Patient not taking: Reported on 07/06/2024 02/24/24   [provider]    Scheduled Meds:  allopurinol   300 mg Oral Daily   amiodarone   200 mg Oral Daily   aspirin  EC  81 mg Oral Daily   carvedilol   3.125 mg Oral BID WC   dapagliflozin  propanediol  10 mg Oral Daily   furosemide   80 mg Intravenous Q8H   insulin  aspart  0-20 Units Subcutaneous TID WC   insulin  aspart  0-5 Units Subcutaneous QHS   insulin  aspart  10 Units Subcutaneous TID WC   insulin  glargine-yfgn  30 Units Subcutaneous Daily   ipratropium  2 spray Each Nare Q8H   pantoprazole   40 mg Oral Daily   rivaroxaban   20 mg Oral Q supper   rosuvastatin   20 mg Oral Daily   sacubitril -valsartan   1 tablet Oral BID   sodium chloride  flush  3 mL Intravenous Q12H   spironolactone   25 mg Oral Daily   Continuous Infusions:  PRN Meds: acetaminophen , hydrALAZINE , ipratropium-albuterol , meclizine , ondansetron  (ZOFRAN ) IV, sodium chloride  flush  Allergies:    Allergies  Allergen Reactions   Metformin  And Related Nausea And Vomiting   Ranitidine  Other (See Comments)   Zantac  [Ranitidine  Hcl] Diarrhea and Nausea Only    Night sweats    Social History:   Social History   Socioeconomic History   Marital status: Widowed    Spouse name: Not on  file   Number of children: 1   Years of education: 12   Highest  education level: 12th grade  Occupational History   Occupation: disabled  Tobacco Use   Smoking status: Former    Current packs/day: 0.00    Average packs/day: 0.5 packs/day for 33.0 years (16.5 ttl pk-yrs)    Types: Cigarettes    Start date: 05/09/1987    Quit date: 05/08/2020    Years since quitting: 4.1   Smokeless tobacco: Former    Quit date: 05/08/2020   Tobacco comments:    Quit Sept 2021  Vaping Use   Vaping status: Former  Substance and Sexual Activity   Alcohol use: Not Currently   Drug use: No   Sexual activity: Not Currently  Other Topics Concern   Not on file  Social History Narrative   Lives at home with wife and his dad's wife.        Works sales/advanced research scientist (life sciences); smoker; cutting; hx of alcoholism [started 13 year]; quit at age of 22 years.    Social Drivers of Health   Financial Resource Strain: Medium Risk (05/06/2024)   Received from [facility]   Overall Financial Resource Strain (CARDIA)    Difficulty of Paying Living Expenses: Somewhat hard  Food Insecurity: No Food Insecurity (07/07/2024)   Hunger Vital Sign    Worried About Running Out of Food in the Last Year: Never true    Ran Out of Food in the Last Year: Never true  Transportation Needs: No Transportation Needs (07/07/2024)   PRAPARE - Administrator, Civil Service (Medical): No    Lack of Transportation (Non-Medical): No  Physical Activity: Insufficiently Active (01/20/2018)   Exercise Vital Sign    Days of Exercise per Week: 7 days    Minutes of Exercise per Session: 10 min  Stress: Stress Concern Present (09/02/2017)   [name] of Occupational Health - Occupational Stress Questionnaire    Feeling of Stress : Very much  Social Connections: Unknown (10/22/2023)   Social Connection and Isolation Panel    Frequency of Communication with Friends and Family: Not on file    Frequency of Social Gatherings with Friends and Family: Patient declined    Attends Religious  Services: Patient declined    Active Member of Clubs or Organizations: Patient declined    Attends Banker Meetings: Patient declined    Marital Status: Patient declined  Intimate Partner Violence: Not At Risk (07/07/2024)   Humiliation, Afraid, Rape, and Kick questionnaire    Fear of Current or Ex-Partner: No    Emotionally Abused: No    Physically Abused: No    Sexually Abused: No    Family History:    Family History  Problem Relation Age of Onset   Diabetes Mother    Diabetes Mellitus II Mother    Hypothyroidism Mother    Hypertension Mother    Kidney failure Mother        Dialysis   Heart attack Mother        48 yo approximately   Hypertension Father    Gout Father    Cancer Maternal Grandfather    Diabetes Maternal Grandfather    Cancer Paternal Aunt      ROS:  Please see the history of present illness.  Review of Systems  Constitutional: Negative.   HENT: Negative.    Respiratory: Negative.    Cardiovascular: Negative.   Gastrointestinal: Negative.  Abdominal distention  Musculoskeletal: Negative.   Neurological: Negative.   Psychiatric/Behavioral: Negative.    All other systems reviewed and are negative.   Physical Exam/Data: Vitals:   07/07/24 0330 07/07/24 0333 07/07/24 0847 07/07/24 1211  BP: (!) 113/56  (!) 151/98 (!) 140/83  Pulse: 91  93 91  Resp: 20  20 14   Temp: (!) 97.4 F (36.3 C)  97.7 F (36.5 C) 98.4 F (36.9 C)  TempSrc:   Oral Oral  SpO2: (!) 89%  95% 93%  Weight:  118.5 kg    Height:        Intake/Output Summary (Last 24 hours) at 07/07/2024 1532 Last data filed at 07/07/2024 1457 Gross per 24 hour  Intake 960 ml  Output 2675 ml  Net -1715 ml      07/07/2024    3:33 AM 07/06/2024    7:37 AM 07/06/2024    5:22 AM  Last 3 Weights  Weight (lbs) 261 lb 3.9 oz 270 lb 9.6 oz 260 lb  Weight (kg) 118.5 kg 122.743 kg 117.935 kg     Body mass index is 36.44 kg/m.  General:  Well nourished, well developed,  in no acute distress HEENT: normal Neck: no JVD Vascular: No carotid bruits; Distal pulses 2+ bilaterally Cardiac:  normal S1, S2; RRR; no murmur  Lungs:  clear to auscultation bilaterally, no wheezing, rhonchi or rales  Abd: soft, distended nontender, no hepatomegaly  Ext: no edema Musculoskeletal:  No deformities, BUE and BLE strength normal and equal Skin: warm and dry  Neuro:  CNs 2-12 intact, no focal abnormalities noted Psych:  Normal affect   EKG:  The EKG was personally reviewed and demonstrates:   Telemetry:  Telemetry was personally reviewed and demonstrates:    Relevant CV Studies:   Laboratory Data: High Sensitivity Troponin:  No results for input(s): TROPONINIHS in the last 720 hours.   Chemistry Recent Labs  Lab 07/06/24 0525 07/07/24 0547  NA 138 138  K 4.0 3.8  CL 104 98  CO2 19* 26  GLUCOSE 226* 215*  BUN 18 28*  CREATININE 1.34* 1.52*  CALCIUM  9.7 10.1  GFRNONAA >60 54*  ANIONGAP 14 14    Recent Labs  Lab 07/06/24 0525  PROT 7.5  ALBUMIN  4.2  AST 22  ALT 21  ALKPHOS 62  BILITOT 0.8   Lipids No results for input(s): CHOL, TRIG, HDL, LABVLDL, LDLCALC, CHOLHDL in the last 168 hours.  Hematology Recent Labs  Lab 07/06/24 0525  WBC 9.3  RBC 5.71  HGB 15.8  HCT 48.9  MCV 85.6  MCH 27.7  MCHC 32.3  RDW 13.2  PLT 169   Thyroid  No results for input(s): TSH, FREET4 in the last 168 hours.  BNP Recent Labs  Lab 07/06/24 0613  PROBNP 2,488.0*    DDimer No results for input(s): DDIMER in the last 168 hours.  Radiology/Studies:  US  ABDOMEN LIMITED RUQ (LIVER/GB) Result Date: 07/06/2024 CLINICAL DATA:  Right upper quadrant pain. EXAM: ULTRASOUND ABDOMEN LIMITED RIGHT UPPER QUADRANT COMPARISON:  Ultrasound 04/07/2024 and CT earlier today. FINDINGS: Gallbladder: No gallstones or wall thickening visualized. No sonographic Murphy sign noted by sonographer. Common bile duct: Diameter: 2.2 mm. Liver: Moderate hazy echotexture  compatible with a degree of steatosis. No focal liver mass. Portal vein is patent on color Doppler imaging with normal direction of blood flow towards the liver. Other: No free fluid. IMPRESSION: 1. No acute findings. 2. Moderate hepatic steatosis. Electronically Signed   By: Toribio Agreste  M.D.   On: 07/06/2024 10:29   CT Angio Chest/Abd/Pel for Dissection W and/or Wo Contrast Result Date: 07/06/2024 CLINICAL DATA:  Shortness of breath and hypertension. History of prior pulmonary embolism. Upper abdominal pain. EXAM: CT ANGIOGRAPHY CHEST, ABDOMEN AND PELVIS TECHNIQUE: Non-contrast CT of the chest was initially obtained. Multidetector CT imaging through the chest, abdomen and pelvis was performed using the standard protocol during bolus administration of intravenous contrast. Multiplanar reconstructed images and MIPs were obtained and reviewed to evaluate the vascular anatomy. RADIATION DOSE REDUCTION: This exam was performed according to the departmental dose-optimization program which includes automated exposure control, adjustment of the mA and/or kV according to patient size and/or use of iterative reconstruction technique. CONTRAST:  OMNIPAQUE  IOHEXOL  350 MG/ML SOLN COMPARISON:  CTA chest 11/03/2023, 05/14/2022, CT abdomen/pelvis 03/28/2021 FINDINGS: CTA CHEST FINDINGS Cardiovascular: Borderline stable cardiomegaly. Mild calcified plaque over the left main and 3 vessel coronary arteries. Thoracic aorta is normal caliber without aneurysm or dissection. Normal 3 vessel takeoff from the aortic arch. Pulmonary arterial system is unremarkable. Remaining vascular structures are unremarkable. Mediastinum/Nodes: 1.2 cm right paratracheal lymph node. 1.3 cm precarinal lymph node without significant change 1.6 cm right subcarinal lymph node (previously 1.3 cm). 1.2 cm left thyroid  nodule without significant change. Remaining mediastinal structures are unremarkable. Lungs/Pleura: Lungs are adequately inflated  with tiny bilateral pleural effusions right larger than left with associated basilar atelectasis. Several small scattered bilateral calcified granulomas are present. Noncalcified 5 mm nodule over the left upper lobe (image 41) unchanged from September 2023 and therefore considered benign. No acute lobar consolidation. Airways are normal. Musculoskeletal: No focal abnormality. Review of the MIP images confirms the above findings. CTA ABDOMEN AND PELVIS FINDINGS VASCULAR Aorta: Mild calcified plaque over the abdominal aorta. Abdominal aorta is normal in caliber without aneurysm or dissection. Celiac: Patent without evidence of aneurysm, dissection, vasculitis or significant stenosis. SMA: Patent without evidence of aneurysm, dissection, vasculitis or significant stenosis. Renals: Both renal arteries are patent without evidence of aneurysm, dissection, vasculitis, fibromuscular dysplasia or significant stenosis. Small accessory left renal artery just superior to the native artery. IMA: Patent without evidence of aneurysm, dissection, vasculitis or significant stenosis. Inflow: Patent without evidence of aneurysm, dissection, vasculitis or significant stenosis. Veins: No obvious venous abnormality within the limitations of this arterial phase study. Review of the MIP images confirms the above findings. NON-VASCULAR Hepatobiliary: Liver, gallbladder and biliary tree are normal. Pancreas: Normal. Spleen: Normal. Adrenals/Urinary Tract: Adrenal glands are normal. Kidneys are normal in size without hydronephrosis or nephrolithiasis. Mild cystic change over the kidneys which is stable. Ureters and bladder are normal. Stomach/Bowel: Mild gastric distension. Small bowel is normal. Appendix is normal. Colon is unremarkable. Lymphatic: No adenopathy. Reproductive: Prostate is unremarkable. Other: No free fluid or focal inflammatory change. Tiny umbilical hernia containing only peritoneal fat. Musculoskeletal: No focal  abnormality. Review of the MIP images confirms the above findings. IMPRESSION: 1. No acute findings in the chest, abdomen or pelvis. No evidence of thoracic or abdominal aortic aneurysm or dissection. 2. Tiny bilateral pleural effusions right larger than left with associated basilar atelectasis. 3. Evidence of old granulomatous disease. 4. Mild mediastinal adenopathy likely reactive. 5. Aortic atherosclerosis. Atherosclerotic coronary artery disease. 6. Tiny umbilical hernia containing only peritoneal fat. Stable small renal cysts. Aortic Atherosclerosis (ICD10-I70.0). Electronically Signed   By: Toribio Agreste M.D.   On: 07/06/2024 08:49   DG Chest Portable 1 View Result Date: 07/06/2024 CLINICAL DATA:  Shortness of breath EXAM: PORTABLE CHEST 1  VIEW COMPARISON:  02/06/2024 FINDINGS: Left chest wall subcutaneous implanted defibrillator is again noted. Stable cardiomediastinal contours. Low lung volumes. No airspace consolidation, pleural effusion or edema. Visualized osseous structures appear intact. IMPRESSION: Low lung volumes. No acute findings. Electronically Signed   By: Waddell Calk M.D.   On: 07/06/2024 07:15   EKG personally reviewed by myself on todays visit Normal sinus rhythm 90 bpm nonspecific ST abnormality   Assessment and Plan: --Acute on chronic diastolic and systolic CHF Over the past month or more with worsening weight gain, abdominal distention -Despite compliance with his torsemide , extra doses of metolazone , no improvement in symptoms and he presents to the emergency room - Elevated BNP 2400 - CT chest very small bilateral pleural effusions right greater than left - Recommend we increase Lasix  up to 40 mg IV every 8 hours - Continue outpatient medications including carvedilol , Farxiga , spironolactone , Entresto   Paroxysmal atrial fibrillation Ablation deferred by EP on office visit October 2025 Maintaining normal sinus rhythm Continue Xarelto , amiodarone , Coreg   Coronary  artery disease with stable angina Cardiac catheterization August 19, 2023, stable appearance of coronary arteries compared to prior study 2021 -Continue Zetia , Crestor  20, Xarelto  in place of aspirin    For questions or updates, please contact [facility] Please consult [email or web address] for contact info under   Signed, Ramiya Delahunty, MD  07/07/2024 3:32 PM

## 2024-07-07 NOTE — Progress Notes (Signed)
 Progress Note   Patient: Gabriel [name]. FMW:[id number] DOB: 1971-05-07 DOA: 07/06/2024     0 DOS: the patient was seen and examined on 07/07/2024   Brief hospital course: 53 y.o. male with medical history significant of chronic combined HFrEF and HFpEF, pulmonary hypertension, COPD, nonobstructive CAD, status post ICD, PE on Xarelto , OSA on CPAP, morbid obesity, HTN, IDDM, cirrhosis secondary to NASH, CKD stage IIIa, presented with increasing shortness of breath and abdominal pain.   Symptoms started 4-5 days ago, patient started to notice intermittent  stretching like abdominal pain, worsening with torso movement.  He also noticed increasing girth of his abdomen and he gained more than 10 pounds in last 7 days.  He tried to increase his torsemide  dosage to twice daily with no significant improvement.  Yesterday he started develop wheezing and dry cough and increasing shortness of breath and could not lie flat last night because of shortness of breath.  Denies any chest pain no fever or chills.  No ankle edema.   ED Course: Afebrile, borderline tachycardia blood pressure elevated 170/97 95% on room air.  CT chest abdomen showed no acute findings, blood work showed BUN 18 creatinine 1.3 glucose 220, bicarb 19, WBC 9.2 hemoglobin 15.8.  11/19.  Patient states he was 230 pounds back in January.  Yesterday's weight was 270 pounds.  Patient feels a little bit better today but short of breath.  Assessment and Plan: * Acute on chronic systolic CHF (congestive heart failure) [facility]) Cardiology increase Lasix  to 80 mg IV every 8 hours, continue Entresto , spironolactone , Farxiga  and low-dose Coreg .  Abdominal swelling Continue spironolactone   PAF (paroxysmal atrial fibrillation) (HCC) On amiodarone   CKD stage 3a, GFR 45-59 ml/min (HCC) Today's creatinine 1.52 with a GFR 54  Type 2 diabetes mellitus with hyperlipidemia (HCC) Patient on Crestor  and 30 units of Semglee  insulin  and 10 units of  NovoLog  3 times daily  NASH (nonalcoholic steatohepatitis) Outpatient follow-up.  Obesity (BMI 30-39.9) BMI 36.44, class II obesity.  History of pulmonary embolism On Xarelto   Polycythemia vera (HCC) Follow-up as outpatient  COPD (chronic obstructive pulmonary disease) (HCC) Respiratory status stable.        Subjective: Patient coming in with shortness of breath.  Admitted with heart failure exacerbation.  States his abdomen is a little swollen also.  Physical Exam: Vitals:   07/07/24 0333 07/07/24 0847 07/07/24 1211 07/07/24 1629  BP:  (!) 151/98 (!) 140/83 110/76  Pulse:  93 91 83  Resp:  20 14 19   Temp:  97.7 F (36.5 C) 98.4 F (36.9 C) 97.8 F (36.6 C)  TempSrc:  Oral Oral   SpO2:  95% 93% 97%  Weight: 118.5 kg     Height:       Physical Exam HENT:     Head: Normocephalic.     Mouth/Throat:     Pharynx: No oropharyngeal exudate.  Eyes:     General: Lids are normal.     Conjunctiva/sclera: Conjunctivae normal.  Cardiovascular:     Rate and Rhythm: Normal rate and regular rhythm.     Heart sounds: Normal heart sounds, S1 normal and S2 normal.  Pulmonary:     Breath sounds: Examination of the right-lower field reveals decreased breath sounds and rales. Examination of the left-lower field reveals decreased breath sounds and rales. Decreased breath sounds and rales present. No wheezing or rhonchi.  Abdominal:     General: There is distension.     Palpations: Abdomen is soft.  Tenderness: There is no abdominal tenderness.  Musculoskeletal:     Right lower leg: No swelling.     Left lower leg: No swelling.  Skin:    General: Skin is warm.     Findings: No rash.  Neurological:     Mental Status: He is alert and oriented to person, place, and time.     Data Reviewed: Creatinine 1.52, GFR 54, proBNP 2488, troponin 42 and 41, CBC normal range  Disposition: Status is: Inpatient Remains inpatient appropriate because: Continue IV diuresis.  Patient  states he normally weighs 230.  Yesterday's weight was 270 today down to 261  Planned Discharge Destination: Home    Time spent: 28 minutes  Author: Charlie Patterson, MD 07/07/2024 4:56 PM  For on call review [email or web address].

## 2024-07-07 NOTE — Assessment & Plan Note (Signed)
Respiratory status stable. 

## 2024-07-07 NOTE — Assessment & Plan Note (Addendum)
 On amiodarone  and Xarelto  (will hold for right heart cath)

## 2024-07-07 NOTE — Assessment & Plan Note (Signed)
 Follow up as outpatient

## 2024-07-07 NOTE — Assessment & Plan Note (Addendum)
 With hyperglycemia also.  Patient on Crestor  and will increase to 36 units of Semglee  insulin  and 10 units of NovoLog  3 times daily

## 2024-07-07 NOTE — Assessment & Plan Note (Addendum)
 On Xarelto  (hold for right heart cath)

## 2024-07-07 NOTE — Hospital Course (Addendum)
 Hospital course / significant events:   53 y.o. male with medical history significant of chronic combined HFrEF and HFpEF, pulmonary hypertension, COPD, nonobstructive CAD, status post ICD, PE on Xarelto , OSA on CPAP, morbid obesity, HTN, IDDM, cirrhosis secondary to NASH, CKD stage IIIa, presented with increasing shortness of breath and abdominal pain.   HPI: Symptoms started 4-5 days ago, patient started to notice intermittent stretching like abdominal pain, worsening with torso movement.  He also noticed increasing girth of his abdomen and he gained more than 10 pounds in last 7 days.  He tried to increase his torsemide  dosage to twice daily with no significant improvement.  Yesterday he started develop wheezing and dry cough and increasing shortness of breath and could not lie flat last night because of shortness of breath.  Denies any chest pain no fever or chills.  No ankle edema.  ED Course: Afebrile, borderline tachycardia blood pressure elevated 170/97 95% on room air.  CT chest abdomen showed no acute findings, blood work showed BUN 18 creatinine 1.3 glucose 220, bicarb 19, WBC 9.2 hemoglobin 15.8.  11/19.  Patient states he was 230 pounds back in January.  Yesterday's weight was 270 pounds.  Patient feels a little bit better today but short of breath.  Cardiology increased Lasix  to 3 times daily dosing 11/20.  Creatinine up to 2.14 hold Lasix  today. 11/21.  Creatinine still elevated at 1.94.  Holding Lasix  and Entresto  currently.  Patient's weight 255.29 pounds.  With lactic acid elevated and we started milrinone  drip and PICC line placed. 11/22.  Patient feeling little bit better.  Weight 255.1. 11/23.  Patient had some pain in his right upper quadrant last night.  Reviewed sonogram done on admission that did not show any acute cholecystitis.  Continue milrinone  drip.  Weight 253.53. 11/24.  Creatinine up at 2.18, weight 254.41 11/25.  Right heart catheterization today to assess fluid  status - mildlly increased L and R filling pressures.  Still on milrinone  drip hoping to wean off, cardiology team thinkg max diuresis has been achieved. Added digoxin , started torsemide , ideally will be able to wean milrinone  tomorrow but if not tolerating this may need to consider transfer to advanced CHF team at [facility]. At this time, he is not a candidate for advanced therapies such as LVAD given that his LVEF greater than 35% and obesity.   Creatinine down to 1.63, weight up to 256.17 pounds. Plan repeat Echo.  11/26:      Consultants:  Cardiology   Procedures/Surgeries: ***      ASSESSMENT & PLAN:   Cardiogenic shock  Patient started on milrinone  on 11/21.  Continue milrinone  drip.  Held Lasix  yesterday with creatinine elevation.  Last EF 35 to 40%.  Today's creatinine 1.63.  Right heart catheterization today to assess fluid status to determine whether more diuresis is needed or not.  Today's weight slightly elevated at 256.17.  (Patient's weight on admission 270.6 pounds and patient told me he was 230 pounds back in January)   Acute on chronic systolic heart failure (HCC) Continue milrinone  drip.  Lasix  held yesterday with creatinine elevation.  Holding Entresto  today.  Holding spironolactone .  Holding low-dose Coreg  (while on milrinone ).  Patient on Farxiga .  Last EF 35 to 40%.  Cardiology added hydralazine  yesterday.   Abdominal swelling Holding spironolactone  No further abdominal pain   Acute kidney injury superimposed on CKD AKI on CKD stage IIIa.  Creatinine rose to 2.14 on 11/20.  Creatinine rose to 2.18 on 11/24.  Creatinine 1.34 on presentation.  Monitor closely on milrinone .  Today's creatinine 1.63   PAF (paroxysmal atrial fibrillation) (HCC) On amiodarone  and Xarelto  (will hold for right heart cath)   Type 2 diabetes mellitus with hyperlipidemia (HCC) With hyperglycemia also.  Patient on Crestor  and will continue 36 units of Semglee  insulin  and 10 units of NovoLog  3  times daily   Hypokalemia Supplemented   NASH (nonalcoholic steatohepatitis) Outpatient follow-up.   Obesity (BMI 30-39.9) BMI 35.73, class II obesity.   History of pulmonary embolism On Xarelto  (hold for right heart cath)   Polycythemia vera (HCC) Follow-up as outpatient   COPD (chronic obstructive pulmonary disease) (HCC) Respiratory status stable.      *** based on BMI: Body mass index is 35.39 kg/m.SABRA Significantly low or high BMI is associated with higher medical risk.  Underweight - under 18  overweight - 25 to 29 obese - 30 or more Class 1 obesity: BMI of 30.0 to 34 Class 2 obesity: BMI of 35.0 to 39 Class 3 obesity: BMI of 40.0 to 49 Super Morbid Obesity: BMI 50-59 Super-super Morbid Obesity: BMI 60+ Healthy nutrition and physical activity advised as adjunct to other disease management and risk reduction treatments    DVT prophylaxis: *** IV fluids: *** continuous IV fluids  Nutrition: *** Central lines / other devices: ***  Code Status: *** ACP documentation reviewed: *** none on file in [facility]  TOC needs: *** Medical barriers to dispo: ***. Expected medical readiness for discharge ***.

## 2024-07-07 NOTE — Progress Notes (Signed)
 Heart Failure Stewardship Pharmacy Note  PCP: Franchot Isaiah LABOR, MD PCP-Cardiologist: [name], MD  HPI: Gabriel [name]. is a 53 y.o. male with atrial fibrillation, HTN, T2DM, COPD, CKD, asthma, NSVT, OSA, unprovoked PE, pulmonary hypertension, psoriasis, migraine, NASH, ICD placement 05/2022, and CHF who presented with upper abdominal pain, abdominal distention, and shortness of breath. On admission, proBNP was 2488, CO2 was 19, HS-troponin was 42, LFTs WNL, and lipase was 61. Chest x-ray noted no acute findings.   Pertinent cardiac history: RHC/LHC in 05/2020 noted CTO of proximal RCA filled via elft to right collaterals, moderate PH (PVR 7.4 WU), and severely reduced cardiac output/index. TTE 05/2021 with LVEF 40-45%. ICD placed 05/2022. TTE 07/2022 EF 40-45% with mild LVH, G1DD, severe hypokinesis of left ventricular, entire inferior and inferolateral wall.  TTE 03/08/23 EF 45-50% with moderate LVH, trivial MR, mild dilatation of aortic root at 43 mm. TTE 10/2023 with LVEF 35-40%, global LV hypokinesis, severely dilated LV internal cavity size, mild LVH, small pericardial effusion, mild MR. TTE 12/2023 with LVEF 35-40%, mild LVH, low normal RV, mild LAE, mild/ moderate AR, mild dilatation of the aortic root, measuring 40 mm. RHC 06/2024 noted RA of 3, PA 34/15 (24), CO 4.3 + CI 1.8 via fick, CO 4.8 + CI 2 via thermo, PCWP 22, PAPi 6.3.   Pertinent Lab Values: Creatinine  Date Value Ref Range Status  09/29/2023 1.20 0.61 - 1.24 mg/dL Final  97/80/7984 9.22 0.60 - 1.30 mg/dL Final   Creatinine, Ser  Date Value Ref Range Status  07/07/2024 1.52 (H) 0.61 - 1.24 mg/dL Final   BUN  Date Value Ref Range Status  07/07/2024 28 (H) 6 - 20 mg/dL Final  91/80/7974 43 (H) 6 - 24 mg/dL Final  97/80/7984 10 7 - 18 mg/dL Final   Potassium  Date Value Ref Range Status  07/07/2024 3.8 3.5 - 5.1 mmol/L Final  10/07/2013 3.8 3.5 - 5.1 mmol/L Final   Sodium  Date Value Ref Range Status   07/07/2024 138 135 - 145 mmol/L Final  04/06/2024 137 134 - 144 mmol/L Final  10/07/2013 139 136 - 145 mmol/L Final   B Natriuretic Peptide  Date Value Ref Range Status  06/28/2024 143.3 (H) 0.0 - 100.0 pg/mL Final    Comment:    Performed at [facility], [address] [place], [place] [address]   Magnesium   Date Value Ref Range Status  04/06/2024 2.2 1.6 - 2.3 mg/dL Final   Hemoglobin J8R  Date Value Ref Range Status  04/02/2024 8.8 (A) 4.0 - 5.6 % Final   Hgb A1c MFr Bld  Date Value Ref Range Status  01/01/2024 6.9 (H) 4.8 - 5.6 % Final    Comment:             Prediabetes: 5.7 - 6.4          Diabetes: >6.4          Glycemic control for adults with diabetes: <7.0    TSH  Date Value Ref Range Status  04/06/2024 1.600 0.450 - 4.500 uIU/mL Final    Vital Signs:  Temp:  [97.4 F (36.3 C)-98.7 F (37.1 C)] 97.4 F (36.3 C) (11/19 0330) Pulse Rate:  [91-108] 91 (11/19 0330) Cardiac Rhythm: Normal sinus rhythm (11/18 2053) Resp:  [18-27] 20 (11/19 0330) BP: (113-179)/(55-102) 113/56 (11/19 0330) SpO2:  [89 %-99 %] 89 % (11/19 0330) Weight:  [118.5 kg (261 lb 3.9 oz)-122.7 kg (270 lb 9.6 oz)] 118.5 kg (  261 lb 3.9 oz) (11/19 0333)  Intake/Output Summary (Last 24 hours) at 07/07/2024 0729 Last data filed at 07/07/2024 0335 Gross per 24 hour  Intake 720 ml  Output 3425 ml  Net -2705 ml    Current Heart Failure Medications:  Loop diuretic: furosemide  80 mg IV BID Beta-Blocker: carvedilol  3.125 mg BID ACEI/ARB/ARNI: Entresto  49-51 mg BID MRA: none SGLT2i: none Other: none  Prior to admission Heart Failure Medications:  Loop diuretic: torsemide  80 mg daily Beta-Blocker: carvedilol  3.125 mg BID ACEI/ARB/ARNI: Entresto  49-51 mg BID MRA: spironolactone  25 mg daily SGLT2i: Farxiga  10 mg daily Other: none  Assessment: 1. Acute on chronic systolic heart failure (LVEF 35-40%) with known low cardiac output, due to NICM. NYHA class III symptoms.   -Symptoms: Reports shortness of breath with minimal exertion. Abdominal pain is improving, still reports discomfort and fullness. Appetite is fair. Reports dry weight was ~230 lbs several months ago. No significant LEE. -Volume: Appears to still hypervolemic, though creatinine and BUN have bumped. Reports neither home torsemide  nor Furoscix  from clinic were successful in increasing urine output at home. Had gained 4 lbs overnight. Abdominal edema likely decreasing absorption of oral diuretics. Can continue current doseof diuretics and monitor urine output, but patient will need to trial oral regimen prior to discharge and may need scheduled metolazone . -Hemodynamics: BP elevated with a few normal readings overnight. HR 90s. CO2 was 19 on admission labs, likely has mild lactic acidosis. LFTs were WNL and pulse pressure is not narrow.  -BB: Continue carvedilol  3.125 mg BID at this time. Would not increase this admission given known chronic low output HF. -ACEI/ARB/ARNI: Continue Entresto  49-51 mg BID. After adding back held GDMT, can consider increasing to target dose pending BP. -MRA: Consider restarting home spironolactone  25 mg daily -SGLT2i: Consider restarting home Farxiga  10 mg daily  Plan: 1) Medication changes recommended at this time: -Consider restarting home spironolactone  25 mg daily and Farxiga  10 mg daily  2) Patient assistance: -Pending  3) Education: - Patient has been educated on current HF medications and potential additions to HF medication regimen - Patient verbalizes understanding that over the next few months, these medication doses may change and more medications may be added to optimize HF regimen - Patient has been educated on basic disease state pathophysiology and goals of therapy  Please do not hesitate to reach out with questions or concerns,  Jaun Bash, PharmD, CPP, BCPS, [facility] Heart Failure Pharmacist  Phone - [phone] 07/07/2024 10:08 AM

## 2024-07-08 DIAGNOSIS — N179 Acute kidney failure, unspecified: Secondary | ICD-10-CM

## 2024-07-08 DIAGNOSIS — N189 Chronic kidney disease, unspecified: Secondary | ICD-10-CM

## 2024-07-08 DIAGNOSIS — I5043 Acute on chronic combined systolic (congestive) and diastolic (congestive) heart failure: Secondary | ICD-10-CM | POA: Diagnosis not present

## 2024-07-08 DIAGNOSIS — I25118 Atherosclerotic heart disease of native coronary artery with other forms of angina pectoris: Secondary | ICD-10-CM | POA: Diagnosis not present

## 2024-07-08 DIAGNOSIS — R1900 Intra-abdominal and pelvic swelling, mass and lump, unspecified site: Secondary | ICD-10-CM | POA: Diagnosis not present

## 2024-07-08 DIAGNOSIS — I5023 Acute on chronic systolic (congestive) heart failure: Secondary | ICD-10-CM | POA: Diagnosis not present

## 2024-07-08 DIAGNOSIS — I480 Paroxysmal atrial fibrillation: Secondary | ICD-10-CM | POA: Diagnosis not present

## 2024-07-08 LAB — BASIC METABOLIC PANEL WITH GFR
Anion gap: 16 — ABNORMAL HIGH (ref 5–15)
BUN: 51 mg/dL — ABNORMAL HIGH (ref 6–20)
CO2: 28 mmol/L (ref 22–32)
Calcium: 10 mg/dL (ref 8.9–10.3)
Chloride: 94 mmol/L — ABNORMAL LOW (ref 98–111)
Creatinine, Ser: 2.14 mg/dL — ABNORMAL HIGH (ref 0.61–1.24)
GFR, Estimated: 36 mL/min — ABNORMAL LOW (ref >60)
Glucose, Bld: 182 mg/dL — ABNORMAL HIGH (ref 70–99)
Potassium: 3.7 mmol/L (ref 3.5–5.1)
Sodium: 139 mmol/L (ref 135–145)

## 2024-07-08 LAB — MAGNESIUM: Magnesium: 2.3 mg/dL (ref 1.7–2.4)

## 2024-07-08 LAB — GLUCOSE, CAPILLARY
Glucose-Capillary: 233 mg/dL — ABNORMAL HIGH (ref 70–99)
Glucose-Capillary: 192 mg/dL — ABNORMAL HIGH (ref 70–99)
Glucose-Capillary: 150 mg/dL — ABNORMAL HIGH (ref 70–99)
Glucose-Capillary: 192 mg/dL — ABNORMAL HIGH (ref 70–99)

## 2024-07-08 NOTE — Progress Notes (Signed)
 Heart Failure Navigator Progress Note  Advanced Heart Failure Team patient.  Navigator obtained a ReDs reading for cardiology this morning.  Patient has a previously scheduled follow-up with the [facility] Clinic on 07/20/24 @ 8:30 AM.  Patient is aware of previously scheduled follow-up with [facility] Clinic. Navigator will adjust appointment appropriateness if needed when discharge gets closer.   Charmaine Pines, RN, BSN [facility] Heart Failure Navigator Secure Chat Only

## 2024-07-08 NOTE — Inpatient Diabetes Management (Signed)
 Inpatient Diabetes Program Recommendations  AACE/ADA: New Consensus Statement on Inpatient Glycemic Control (2015)  Target Ranges:  Prepandial:   less than 140 mg/dL      Peak postprandial:   less than 180 mg/dL (1-2 hours)      Critically ill patients:  140 - 180 mg/dL    Latest Reference Range & Units 07/07/24 08:46 07/07/24 12:10 07/07/24 16:30 07/07/24 20:34  Glucose-Capillary 70 - 99 mg/dL 781 (H)  17 units Novolog   30 units Semglee   231 (H)  17 units Novolog   259 (H)  21 units Novolog   288 (H)  3 units Novolog    (H): Data is abnormally high  Latest Reference Range & Units 07/08/24 08:21  Glucose-Capillary 70 - 99 mg/dL 766 (H)  (H): Data is abnormally high   Home DM Meds: Farxiga  10 mg daily Lantus  50 units q HS (reduce dose by 2 units if less than 90 mg/dL fasting) Humalog  20 units tid with meals  Mounjaro  12.5 mg weekly     Current Orders: Semglee  30 units daily Novolog  10 units tid with meals Novolog  0-20 units tid with meals and HS Farxiga  10 mg daily       MD- May consider:   1. Increase the Semglee  to 35 units daily   2. Increase the Novolog  Meal Coverage to 15 units TID (takes 20 units TID at home)     --Will follow patient during hospitalization--   Adina Rudolpho Arrow RN, MSN, CDCES Diabetes Coordinator Inpatient Glycemic Control Team Team Pager: [phone] (8a-5p)

## 2024-07-08 NOTE — Assessment & Plan Note (Addendum)
 AKI on CKD stage IIIa.  Creatinine rose to 2.14 on 11/20.  Today's creatinine 1.99.  Creatinine 1.34 on presentation.  Monitor closely on milrinone  drip and Lasix .

## 2024-07-08 NOTE — Progress Notes (Signed)
 Rounding Note   Patient Name: Gabriel [name]. Date of Encounter: 07/08/2024  [facility] Cardiologist: [name], MD   Subjective Resting in bed, feels his abdomen is less distended Received IV Lasix  every 8 hours yesterday Weight continues to trend down 256 pounds on standing scale today Reds vest score 43/44 indicating continued fluid overload Abdomen still mildly distended but no significant lower extremity edema  Scheduled Meds:  allopurinol   300 mg Oral Daily   amiodarone   200 mg Oral Daily   aspirin  EC  81 mg Oral Daily   carvedilol   3.125 mg Oral BID WC   dapagliflozin  propanediol  10 mg Oral Daily   ezetimibe   10 mg Oral Daily   insulin  aspart  0-20 Units Subcutaneous TID WC   insulin  aspart  0-5 Units Subcutaneous QHS   insulin  aspart  10 Units Subcutaneous TID WC   insulin  glargine-yfgn  30 Units Subcutaneous Daily   ipratropium  2 spray Each Nare Q8H   pantoprazole   40 mg Oral Daily   rivaroxaban   20 mg Oral Q supper   rosuvastatin   20 mg Oral Daily   sodium chloride  flush  3 mL Intravenous Q12H   spironolactone   25 mg Oral Daily   Continuous Infusions:  PRN Meds: acetaminophen , hydrALAZINE , ipratropium-albuterol , meclizine , ondansetron  (ZOFRAN ) IV, sodium chloride  flush   Vital Signs  Vitals:   07/08/24 0427 07/08/24 0500 07/08/24 0821 07/08/24 0901  BP: 111/80  97/60 97/60  Pulse: 85  96   Resp: 20  20   Temp: 97.9 F (36.6 C)  98.1 F (36.7 C)   TempSrc:   Oral   SpO2: 92%  93%   Weight:  116.3 kg    Height:        Intake/Output Summary (Last 24 hours) at 07/08/2024 1151 Last data filed at 07/08/2024 0805 Gross per 24 hour  Intake 480 ml  Output 2025 ml  Net -1545 ml      07/08/2024    5:00 AM 07/07/2024    3:33 AM 07/06/2024    7:37 AM  Last 3 Weights  Weight (lbs) 256 lb 6.3 oz 261 lb 3.9 oz 270 lb 9.6 oz  Weight (kg) 116.3 kg 118.5 kg 122.743 kg      Telemetry Normal sinus rhythm- Personally Reviewed  ECG    - Personally Reviewed  Physical Exam  GEN: No acute distress.   Neck: No JVD Cardiac: RRR, no murmurs, rubs, or gallops.  Respiratory: Clear to auscultation bilaterally. GI: nontender, distended but soft MS: No edema; No deformity. Neuro:  Nonfocal  Psych: Normal affect   Labs High Sensitivity Troponin:  No results for input(s): TROPONINIHS in the last 720 hours.   Chemistry Recent Labs  Lab 07/06/24 0525 07/07/24 0547 07/08/24 0432  NA 138 138 139  K 4.0 3.8 3.7  CL 104 98 94*  CO2 19* 26 28  GLUCOSE 226* 215* 182*  BUN 18 28* 51*  CREATININE 1.34* 1.52* 2.14*  CALCIUM  9.7 10.1 10.0  MG  --   --  2.3  PROT 7.5  --   --   ALBUMIN  4.2  --   --   AST 22  --   --   ALT 21  --   --   ALKPHOS 62  --   --   BILITOT 0.8  --   --   GFRNONAA >60 54* 36*  ANIONGAP 14 14 16*    Lipids No results for input(s): CHOL, TRIG,  HDL, LABVLDL, LDLCALC, CHOLHDL in the last 168 hours.  Hematology Recent Labs  Lab 07/06/24 0525  WBC 9.3  RBC 5.71  HGB 15.8  HCT 48.9  MCV 85.6  MCH 27.7  MCHC 32.3  RDW 13.2  PLT 169   Thyroid  No results for input(s): TSH, FREET4 in the last 168 hours.  BNP Recent Labs  Lab 07/06/24 0613  PROBNP 2,488.0*    DDimer No results for input(s): DDIMER in the last 168 hours.   Radiology  No results found.  Cardiac Studies   Patient Profile   Gabriel [name]. is a 53 y.o. male with a hx of ischemic and nonischemic cardiomyopathy, frequent hospital admissions for acute diastolic and systolic CHF, coronary artery disease, occluded RCA with collaterals,  COPD/long smoking history, paroxysmal atrial fibrillation, pulmonary hypertension, ICD, presenting with worsening weight gain and shortness of breath   A/P: --Acute on chronic diastolic and systolic CHF Worsening weight gain shortness of breath abdominal distention over the past month at home despite compliance with his torsemide  80 daily and treatment with metolazone   for several days through CHF clinic  - On arrival elevated BNP 2400 - CT chest very small bilateral pleural effusions right greater than left -Received dose IV Lasix  this morning, creatinine elevated over to above his baseline -Afternoon Lasix  held, Entresto  held for hypotension Hold parameters placed on carvedilol , spironolactone  for low blood pressure -Continue Farxiga  - Plan to restart IV Lasix  as renal function improves -Do not think he will need pressors such as milrinone  but will see how renal function responds -Reds vest 44 indicating continued fluid overload   Paroxysmal atrial fibrillation Ablation deferred by EP on office visit October 2025 Maintaining normal sinus rhythm Continue Xarelto , amiodarone , Coreg  - Hold parameters placed on carvedilol  given hypotension   Coronary artery disease with stable angina Cardiac catheterization August 19, 2023, stable  compared to prior study 2021 -Continue Zetia , Crestor  20, Xarelto  in place of aspirin   -Denies chest pain concerning for angina  Continued CHF education, case discussed with heart failure clinic  For questions or updates, please contact [facility] Please consult [email or web address] for contact info under     Signed, Carnetta Losada, MD  07/08/2024, 11:51 AM

## 2024-07-08 NOTE — Progress Notes (Signed)
 Progress Note   Patient: Gabriel [name]. FMW:[id number] DOB: [birth year] DOA: 07/06/2024     1 DOS: the patient was seen and examined on 07/08/2024   Brief hospital course: 53 y.o. male with medical history significant of chronic combined HFrEF and HFpEF, pulmonary hypertension, COPD, nonobstructive CAD, status post ICD, PE on Xarelto , OSA on CPAP, morbid obesity, HTN, IDDM, cirrhosis secondary to NASH, CKD stage IIIa, presented with increasing shortness of breath and abdominal pain.   Symptoms started 4-5 days ago, patient started to notice intermittent  stretching like abdominal pain, worsening with torso movement.  He also noticed increasing girth of his abdomen and he gained more than 10 pounds in last 7 days.  He tried to increase his torsemide  dosage to twice daily with no significant improvement.  Yesterday he started develop wheezing and dry cough and increasing shortness of breath and could not lie flat last night because of shortness of breath.  Denies any chest pain no fever or chills.  No ankle edema.   ED Course: Afebrile, borderline tachycardia blood pressure elevated 170/97 95% on room air.  CT chest abdomen showed no acute findings, blood work showed BUN 18 creatinine 1.3 glucose 220, bicarb 19, WBC 9.2 hemoglobin 15.8.  11/19.  Patient states he was 230 pounds back in January.  Yesterday's weight was 270 pounds.  Patient feels a little bit better today but short of breath.  Cardiology increased Lasix  to 3 times daily dosing 11/20.  Creatinine up to 2.14 hold Lasix  today.  Assessment and Plan: * Acute on chronic systolic CHF (congestive heart failure) [facility]) Cardiology increase Lasix  to 80 mg IV every 8 hours on 11/19.  With creatinine rise held Lasix  today.  Hold Entresto  today with low blood pressure.  Continue spironolactone , Farxiga  and low-dose Coreg .  Abdominal swelling Continue spironolactone   Acute kidney injury superimposed on CKD AKI on CKD stage IIIa.   Creatinine rose to 2.14 with high-dose Lasix  yesterday.  Yesterday's creatinine 1.52.  PAF (paroxysmal atrial fibrillation) (HCC) On amiodarone  and Xarelto   Type 2 diabetes mellitus with hyperlipidemia (HCC) Patient on Crestor  and 30 units of Semglee  insulin  and 10 units of NovoLog  3 times daily  NASH (nonalcoholic steatohepatitis) Outpatient follow-up.  Obesity (BMI 30-39.9) BMI 35.76, class II obesity.  History of pulmonary embolism On Xarelto   Polycythemia vera (HCC) Follow-up as outpatient  COPD (chronic obstructive pulmonary disease) (HCC) Respiratory status stable.        Subjective: Patient feeling a little bit better with regards to his breathing.  Still has some abdominal distention.  Admitted with CHF.  Physical Exam: Vitals:   07/08/24 0901 07/08/24 1217 07/08/24 1554 07/08/24 1640  BP: 97/60 100/78 (!) 102/56 (!) 102/56  Pulse:  92 85   Resp:  20 18   Temp:  97.8 F (36.6 C) (!) 97.4 F (36.3 C)   TempSrc:  Oral Oral   SpO2:  93% 96%   Weight:      Height:       Physical Exam HENT:     Head: Normocephalic.     Mouth/Throat:     Pharynx: No oropharyngeal exudate.  Eyes:     General: Lids are normal.     Conjunctiva/sclera: Conjunctivae normal.  Cardiovascular:     Rate and Rhythm: Normal rate and regular rhythm.     Heart sounds: Normal heart sounds, S1 normal and S2 normal.  Pulmonary:     Breath sounds: Examination of the right-lower field reveals decreased breath sounds  and rhonchi. Examination of the left-lower field reveals decreased breath sounds and rhonchi. Decreased breath sounds and rhonchi present. No wheezing or rales.  Abdominal:     General: There is distension.     Palpations: Abdomen is soft.     Tenderness: There is no abdominal tenderness.  Musculoskeletal:     Right lower leg: No swelling.     Left lower leg: No swelling.  Skin:    General: Skin is warm.     Findings: No rash.  Neurological:     Mental Status: He is  alert and oriented to person, place, and time.     Data Reviewed: Creatinine 2.14, chloride 94, anion gap 16, magnesium  2.3, GFR 36  Disposition: Status is: Inpatient Remains inpatient appropriate because: With creatinine rise we will hold IV Lasix  today and reassess creatinine tomorrow.  Likely will need to be restarted on IV Lasix   Planned Discharge Destination: Home    Time spent: 28 minutes  Author: Charlie Patterson, MD 07/08/2024 5:19 PM  For on call review [email or web address].

## 2024-07-08 NOTE — Progress Notes (Signed)
 Heart Failure Stewardship Pharmacy Note  PCP: Franchot Isaiah LABOR, MD PCP-Cardiologist: [name], MD  HPI: Gabriel [name]. is a 53 y.o. male with atrial fibrillation, HTN, T2DM, COPD, CKD, asthma, NSVT, OSA, unprovoked PE, pulmonary hypertension, psoriasis, migraine, NASH, ICD placement 05/2022, and CHF who presented with upper abdominal pain, abdominal distention, and shortness of breath. On admission, proBNP was 2488, CO2 was 19, HS-troponin was 42, LFTs WNL, and lipase was 61. Chest x-ray noted no acute findings.   Pertinent cardiac history: RHC/LHC in 05/2020 noted CTO of proximal RCA filled via elft to right collaterals, moderate PH (PVR 7.4 WU), and severely reduced cardiac output/index. TTE 05/2021 with LVEF 40-45%. ICD placed 05/2022. TTE 07/2022 EF 40-45% with mild LVH, G1DD, severe hypokinesis of left ventricular, entire inferior and inferolateral wall. TTE 03/08/23 EF 45-50% with moderate LVH, trivial MR, mild dilatation of aortic root at 43 mm. TTE 10/2023 with LVEF 35-40%, global LV hypokinesis, severely dilated LV internal cavity size, mild LVH, small pericardial effusion, mild MR. TTE 12/2023 with LVEF 35-40%, mild LVH, low normal RV, mild LAE, mild/ moderate AR, mild dilatation of the aortic root, measuring 40 mm. RHC 06/2024 noted RA of 3, PA 34/15 (24), CO 4.3 + CI 1.8 via fick, CO 4.8 + CI 2 via thermo, PCWP 22, PAPi 6.3.   Pertinent Lab Values: Creatinine  Date Value Ref Range Status  09/29/2023 1.20 0.61 - 1.24 mg/dL Final  97/80/7984 9.22 0.60 - 1.30 mg/dL Final   Creatinine, Ser  Date Value Ref Range Status  07/08/2024 2.14 (H) 0.61 - 1.24 mg/dL Final   BUN  Date Value Ref Range Status  07/08/2024 51 (H) 6 - 20 mg/dL Final  91/80/7974 43 (H) 6 - 24 mg/dL Final  97/80/7984 10 7 - 18 mg/dL Final   Potassium  Date Value Ref Range Status  07/08/2024 3.7 3.5 - 5.1 mmol/L Final  10/07/2013 3.8 3.5 - 5.1 mmol/L Final   Sodium  Date Value Ref Range Status   07/08/2024 139 135 - 145 mmol/L Final  04/06/2024 137 134 - 144 mmol/L Final  10/07/2013 139 136 - 145 mmol/L Final   B Natriuretic Peptide  Date Value Ref Range Status  06/28/2024 143.3 (H) 0.0 - 100.0 pg/mL Final    Comment:    Performed at [facility], [address] [place], [place] [address]   Magnesium   Date Value Ref Range Status  07/08/2024 2.3 1.7 - 2.4 mg/dL Final    Comment:    Performed at [facility], [address] [place], [place] [address]   Hemoglobin A1C  Date Value Ref Range Status  04/02/2024 8.8 (A) 4.0 - 5.6 % Final   Hgb A1c MFr Bld  Date Value Ref Range Status  01/01/2024 6.9 (H) 4.8 - 5.6 % Final    Comment:             Prediabetes: 5.7 - 6.4          Diabetes: >6.4          Glycemic control for adults with diabetes: <7.0    TSH  Date Value Ref Range Status  04/06/2024 1.600 0.450 - 4.500 uIU/mL Final    Vital Signs:  Temp:  [97.7 F (36.5 C)-98.5 F (36.9 C)] 97.9 F (36.6 C) (11/20 0427) Pulse Rate:  [83-93] 85 (11/20 0427) Cardiac Rhythm: Normal sinus rhythm (11/19 1900) Resp:  [14-20] 20 (11/20 0427) BP: (110-151)/(69-98) 111/80 (11/20 0427) SpO2:  [92 %-97 %] 92 % (  11/20 0427) Weight:  [116.3 kg (256 lb 6.3 oz)] 116.3 kg (256 lb 6.3 oz) (11/20 0500)  Intake/Output Summary (Last 24 hours) at 07/08/2024 9376 Last data filed at 07/08/2024 0100 Gross per 24 hour  Intake 720 ml  Output 2125 ml  Net -1405 ml    Current Heart Failure Medications:  Loop diuretic: furosemide  80 mg IV TID Beta-Blocker: carvedilol  3.125 mg BID ACEI/ARB/ARNI: none MRA: spironolactone  25 mg daily SGLT2i: Farxiga  10 mg daily Other: none  Prior to admission Heart Failure Medications:  Loop diuretic: torsemide  80 mg daily Beta-Blocker: carvedilol  3.125 mg BID ACEI/ARB/ARNI: Entresto  49-51 mg BID MRA: spironolactone  25 mg daily SGLT2i: Farxiga  10 mg daily Other: none  Assessment: 1. Acute on chronic systolic heart failure  (LVEF 64-59%) with known low cardiac output, due to NICM. NYHA class III symptoms.  -Symptoms: Reports shortness of breath is somewhat improved today. Abdominal pain is improving, still reports fullness. Appetite is fair. Reports dry weight was ~230 lbs several months ago. No significant LEE. -Volume: Remains hypervolemic, though creatinine and BUN continue to trend upward with diuresis. This may be due to inadequate vascular refill times or low output HF. REDS elevated at 44%. Continue furosemide  80 mg TID for now, may require milrinone to augment diuresis. Patient will need to trial oral regimen prior to discharge given previous inadequacy of very high oral diuretic doses. -Hemodynamics: BP elevated with a few normal readings overnight. HR 90s. CO2 was 19 on admission labs, likely had mild lactic acidosis. LFTs were WNL and pulse pressures were not narrow.  -BB: Continue carvedilol  3.125 mg BID at this time. Would not increase this admission given known chronic low output HF. -ACEI/ARB/ARNI: Entresto  held this AM given lower BP and AKI. Can consider resuming when resolved. -MRA: Continue home spironolactone  25 mg daily -SGLT2i: Continue home Farxiga  10 mg daily  Plan: 1) Medication changes recommended at this time: - Cardiology planning Lasix  holiday given AKI. When restarted, would consider augmenting diuresis with metolazone . With low output HF on RHC 2 weeks ago, may require some inotropy if unable to diurese with dose escalation.   2) Patient assistance: -Pending  3) Education: - Patient has been educated on current HF medications and potential additions to HF medication regimen - Patient verbalizes understanding that over the next few months, these medication doses may change and more medications may be added to optimize HF regimen - Patient has been educated on basic disease state pathophysiology and goals of therapy  Please do not hesitate to reach out with questions or  concerns,  Silveria Botz, PharmD, CPP, BCPS, [facility] Heart Failure Pharmacist  Phone - [phone] 07/08/2024 6:23 AM

## 2024-07-08 NOTE — Progress Notes (Signed)
 ReDS Vest / Clip - 07/08/24 0945       ReDS Vest / Clip   Station Marker D    Ruler Value 46    ReDS Value Range High volume overload    ReDS Actual Value 43    Anatomical Comments 2nd reading        1st Reading 44% 2nd Reading 43%  Charmaine Pines, RN, BSN [facility] Heart Failure Navigator Secure Chat Only

## 2024-07-08 NOTE — Plan of Care (Signed)
  Problem: Education: Goal: Knowledge of General Education information will improve Description: Including pain rating scale, medication(s)/side effects and non-pharmacologic comfort measures 07/08/2024 0059 by Cathyann Ceola HERO, RN Outcome: Progressing 07/08/2024 0058 by Cathyann Ceola HERO, RN Outcome: Progressing   Problem: Clinical Measurements: Goal: Respiratory complications will improve 07/08/2024 0059 by Cathyann Ceola HERO, RN Outcome: Progressing 07/08/2024 0058 by Latrel Szymczak M, RN Outcome: Progressing   Problem: Activity: Goal: Risk for activity intolerance will decrease 07/08/2024 0059 by Cathyann Ceola HERO, RN Outcome: Progressing 07/08/2024 0058 by Cathyann Ceola HERO, RN Outcome: Progressing   Problem: Elimination: Goal: Will not experience complications related to bowel motility 07/08/2024 0059 by Cathyann Ceola HERO, RN Outcome: Progressing 07/08/2024 0058 by Cathyann Ceola HERO, RN Outcome: Progressing   Problem: Elimination: Goal: Will not experience complications related to urinary retention 07/08/2024 0059 by Cathyann Ceola HERO, RN Outcome: Progressing 07/08/2024 0058 by Cathyann Ceola HERO, RN Outcome: Progressing   Problem: Pain Managment: Goal: General experience of comfort will improve and/or be controlled 07/08/2024 0059 by Cathyann Ceola HERO, RN Outcome: Progressing 07/08/2024 0058 by Cathyann Ceola HERO, RN Outcome: Progressing   Problem: Safety: Goal: Ability to remain free from injury will improve Outcome: Progressing

## 2024-07-08 NOTE — TOC Initial Note (Signed)
 Transition of Care (TOC) - Initial/Assessment Note    Patient Details  Name: Gabriel [name]. MRN: [id number] Date of Birth: [birth year]  Transition of Care [facility]) CM/SW Contact:    Lauraine JAYSON Carpen, LCSW Phone Number: 07/08/2024, 4:22 PM  Clinical Narrative:   Readmission prevention screen complete. CSW met with patient. No family at bedside. CSW introduced role and explained that discharge planning would be discussed. PCP is Isaiah Pepper, MD but he hasn't had his first appt with her yet. It is scheduled for end of November/beginning of December. Patient drives himself to appointments. [facility]. No issues affording medications. Patient lives home with his father. No home health or DME use prior to admission. No further concerns. CSW will continue to follow patient for support and facilitate return home once stable. He will drive himself home at discharge.               Expected Discharge Plan: Home/Self Care Barriers to Discharge: Continued Medical Work up   Patient Goals and CMS Choice            Expected Discharge Plan and Services     Post Acute Care Choice: NA Living arrangements for the past 2 months: Single Family Home                                      Prior Living Arrangements/Services Living arrangements for the past 2 months: Single Family Home Lives with:: Parents Patient language and need for interpreter reviewed:: Yes Do you feel safe going back to the place where you live?: Yes      Need for Family Participation in Patient Care: Yes (Comment) Care giver support system in place?: Yes (comment)   Criminal Activity/Legal Involvement Pertinent to Current Situation/Hospitalization: No - Comment as needed  Activities of Daily Living   ADL Screening (condition at time of admission) Independently performs ADLs?: Yes (appropriate for developmental age) Is the patient deaf or have difficulty hearing?: No Does the  patient have difficulty seeing, even when wearing glasses/contacts?: No Does the patient have difficulty concentrating, remembering, or making decisions?: No  Permission Sought/Granted                  Emotional Assessment Appearance:: Appears stated age Attitude/Demeanor/Rapport: Engaged, Gracious Affect (typically observed): Accepting, Appropriate, Calm, Pleasant Orientation: : Oriented to Self, Oriented to Place, Oriented to  Time, Oriented to Situation Alcohol / Substance Use: Not Applicable Psych Involvement: No (comment)  Admission diagnosis:  Shortness of breath [R06.02] CHF (congestive heart failure) (HCC) [I50.9] Upper abdominal pain [R10.10] Patient Active Problem List   Diagnosis Date Noted   Abdominal swelling 07/07/2024   PAF (paroxysmal atrial fibrillation) (HCC) 07/07/2024   Obesity (BMI 30-39.9) 07/07/2024   Acute on chronic systolic CHF (congestive heart failure) (HCC) 07/06/2024   Acute on chronic combined systolic and diastolic CHF (congestive heart failure) (HCC) 02/06/2024   Essential hypertension 02/06/2024   NASH (nonalcoholic steatohepatitis) 02/06/2024   CKD stage 3a, GFR 45-59 ml/min (HCC) 02/06/2024   Pulmonary hypertension (HCC) 02/06/2024   Polycythemia vera (HCC) 02/06/2024   Cerebrovascular disease 02/06/2024   Atrial fibrillation with RVR (HCC) 11/03/2023   COPD exacerbation (HCC) 10/22/2023   Cough 10/22/2023   Degenerative tear of glenoid labrum of left shoulder 09/16/2023   Long-term use of aspirin  therapy    Cough with sputum 07/31/2023   Nontraumatic  incomplete tear of left rotator cuff 07/25/2023   Rotator cuff tendinitis, left 07/25/2023   Tendinitis of upper biceps tendon of left shoulder 07/25/2023   Class 1 obesity with alveolar hypoventilation, serious comorbidity, and body mass index (BMI) of 34.0 to 34.9 in adult [facility]) 07/11/2023   Erythrocytosis 05/26/2023   CPAP (continuous positive airway pressure) dependence 03/08/2023    Chronic 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 02/07/2023   Psoriatic arthritis of multiple joints (HCC) 09/17/2022   Polyp of descending colon 08/01/2022   ICD (implantable cardioverter-defibrillator) in place 05/31/2022   Shortness of breath 05/31/2022   Chronic obstructive pulmonary disease (COPD) (HCC) 05/14/2022   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, with long-term current use of insulin  (HCC) 02/08/2022   Depression, recurrent 02/08/2022   Annual physical exam 11/05/2021   Elevated triglycerides with high cholesterol 09/05/2021   Hyperlipidemia associated with type 2 diabetes mellitus (HCC) 09/05/2021   Long term current use of anticoagulant 05/20/2021   Mitral regurgitation 05/20/2021   Mediastinal lymphadenopathy 05/20/2021   Atherosclerotic heart disease of native coronary artery with other forms of angina pectoris    OSA on CPAP 11/23/2020   Type II diabetes mellitus with renal manifestations (HCC) 06/11/2020   COPD (chronic obstructive pulmonary disease) (HCC) 06/11/2020   History of pulmonary embolism    Type 2 diabetes mellitus with hyperlipidemia (HCC) 06/26/2019   Nicotine  dependence, uncomplicated 03/12/2017   Coronary artery disease of native artery of native heart with stable angina pectoris 03/15/2016   Chronic systolic CHF (congestive heart failure) (HCC) 01/23/2016   NICM (nonischemic cardiomyopathy) (HCC) 01/17/2016   NSVT (nonsustained ventricular tachycardia) (HCC)    Other nonspecific abnormal finding of lung field 11/08/2013   PCP:  Franchot Isaiah LABOR, MD Pharmacy:   [facility] [address]), [place] - [address] [place]) [place] [address] Phone: [phone] Fax: [phone]  [facility] - [place], [place] - [address] [place] [address] Phone: [phone] Fax: [phone]  [facility] - [place]  - [place], [place] - [address] [place] [address] Phone: [phone] Fax: [phone]     Social Drivers of Health (SDOH) Social History: SDOH Screenings   Food Insecurity: No Food Insecurity (07/07/2024)  Housing: Unknown (07/07/2024)  Transportation Needs: No Transportation Needs (07/07/2024)  Utilities: Not At Risk (07/07/2024)  Alcohol Screen: Low Risk  (09/17/2022)  Depression (PHQ2-9): Low Risk  (06/17/2024)  Recent Concern: Depression (PHQ2-9) - Medium Risk (04/02/2024)  Financial Resource Strain: Medium Risk (05/06/2024)   Received from [facility]  Physical Activity: Insufficiently Active (01/20/2018)  Social Connections: Unknown (10/22/2023)  Stress: Stress Concern Present (09/02/2017)  Tobacco Use: Medium Risk (07/06/2024)  Health Literacy: Adequate Health Literacy (04/02/2024)   SDOH Interventions:     Readmission Risk Interventions    07/08/2024    4:21 PM 11/04/2023    8:19 PM  Readmission Risk Prevention Plan  Transportation Screening Complete Complete  PCP or Specialist Appt within 5-7 Days Complete   Medication Review (RN CM) Complete   Medication Review (RN Care Manager)  Complete  PCP or Specialist appointment within 3-5 days of discharge  Complete  HRI or Home Care Consult  Complete  SW Recovery Care/Counseling Consult  Complete  Palliative Care Screening  Not Applicable  Skilled Nursing Facility  Not Applicable

## 2024-07-09 ENCOUNTER — Other Ambulatory Visit: Payer: Self-pay

## 2024-07-09 ENCOUNTER — Other Ambulatory Visit: Payer: Self-pay | Admitting: *Deleted

## 2024-07-09 DIAGNOSIS — I5022 Chronic systolic (congestive) heart failure: Secondary | ICD-10-CM

## 2024-07-09 DIAGNOSIS — I5023 Acute on chronic systolic (congestive) heart failure: Secondary | ICD-10-CM | POA: Diagnosis not present

## 2024-07-09 DIAGNOSIS — R1900 Intra-abdominal and pelvic swelling, mass and lump, unspecified site: Secondary | ICD-10-CM | POA: Diagnosis not present

## 2024-07-09 DIAGNOSIS — I428 Other cardiomyopathies: Secondary | ICD-10-CM | POA: Diagnosis not present

## 2024-07-09 DIAGNOSIS — I480 Paroxysmal atrial fibrillation: Secondary | ICD-10-CM | POA: Diagnosis not present

## 2024-07-09 DIAGNOSIS — N179 Acute kidney failure, unspecified: Secondary | ICD-10-CM | POA: Diagnosis not present

## 2024-07-09 DIAGNOSIS — I25118 Atherosclerotic heart disease of native coronary artery with other forms of angina pectoris: Secondary | ICD-10-CM | POA: Diagnosis not present

## 2024-07-09 DIAGNOSIS — I255 Ischemic cardiomyopathy: Secondary | ICD-10-CM

## 2024-07-09 LAB — GLUCOSE, CAPILLARY
Glucose-Capillary: 242 mg/dL — ABNORMAL HIGH (ref 70–99)
Glucose-Capillary: 223 mg/dL — ABNORMAL HIGH (ref 70–99)
Glucose-Capillary: 229 mg/dL — ABNORMAL HIGH (ref 70–99)
Glucose-Capillary: 269 mg/dL — ABNORMAL HIGH (ref 70–99)

## 2024-07-09 LAB — BASIC METABOLIC PANEL WITH GFR
Anion gap: 16 — ABNORMAL HIGH (ref 5–15)
BUN: 64 mg/dL — ABNORMAL HIGH (ref 6–20)
CO2: 24 mmol/L (ref 22–32)
Calcium: 9.9 mg/dL (ref 8.9–10.3)
Chloride: 94 mmol/L — ABNORMAL LOW (ref 98–111)
Creatinine, Ser: 1.94 mg/dL — ABNORMAL HIGH (ref 0.61–1.24)
GFR, Estimated: 40 mL/min — ABNORMAL LOW (ref >60)
Glucose, Bld: 193 mg/dL — ABNORMAL HIGH (ref 70–99)
Potassium: 3.4 mmol/L — ABNORMAL LOW (ref 3.5–5.1)
Sodium: 134 mmol/L — ABNORMAL LOW (ref 135–145)

## 2024-07-09 LAB — HEPATIC FUNCTION PANEL
ALT: 15 U/L (ref 0–44)
AST: 19 U/L (ref 15–41)
Albumin: 4.2 g/dL (ref 3.5–5.0)
Alkaline Phosphatase: 72 U/L (ref 38–126)
Bilirubin, Direct: 0.3 mg/dL — ABNORMAL HIGH (ref 0.0–0.2)
Indirect Bilirubin: 0.7 mg/dL (ref 0.3–0.9)
Total Bilirubin: 1 mg/dL (ref 0.0–1.2)
Total Protein: 7.7 g/dL (ref 6.5–8.1)

## 2024-07-09 LAB — LACTIC ACID, PLASMA
Lactic Acid, Venous: 2.6 mmol/L (ref 0.5–1.9)
Lactic Acid, Venous: 2.5 mmol/L (ref 0.5–1.9)

## 2024-07-09 LAB — MAGNESIUM: Magnesium: 2.4 mg/dL (ref 1.7–2.4)

## 2024-07-09 LAB — PRO BRAIN NATRIURETIC PEPTIDE: Pro Brain Natriuretic Peptide: 143 pg/mL (ref <300.0)

## 2024-07-09 MED ORDER — POTASSIUM CHLORIDE CRYS ER 20 MEQ PO TBCR
40.0000 meq | EXTENDED_RELEASE_TABLET | Freq: Once | ORAL | Status: AC
  Administered 2024-07-09: 40 meq via ORAL
  Filled 2024-07-09: qty 2

## 2024-07-09 MED ORDER — MILRINONE LACTATE IN DEXTROSE 20-5 MG/100ML-% IV SOLN
0.1250 ug/kg/min | INTRAVENOUS | Status: DC
  Administered 2024-07-09 – 2024-07-13 (×5): 0.125 ug/kg/min via INTRAVENOUS
  Filled 2024-07-09 (×5): qty 100

## 2024-07-09 MED ORDER — POTASSIUM CHLORIDE CRYS ER 20 MEQ PO TBCR: 20.0000 meq | EXTENDED_RELEASE_TABLET | Freq: Once | ORAL | Status: DC

## 2024-07-09 MED ORDER — SODIUM CHLORIDE 0.9% FLUSH
10.0000 mL | Freq: Two times a day (BID) | INTRAVENOUS | Status: DC
  Administered 2024-07-09 – 2024-07-11 (×4): 10 mL
  Administered 2024-07-12: 20 mL
  Administered 2024-07-12 – 2024-07-14 (×4): 10 mL

## 2024-07-09 MED ORDER — CHLORHEXIDINE GLUCONATE CLOTH 2 % EX PADS
6.0000 | MEDICATED_PAD | Freq: Every day | CUTANEOUS | Status: DC
  Administered 2024-07-09 – 2024-07-14 (×6): 6 via TOPICAL

## 2024-07-09 MED ORDER — SODIUM CHLORIDE 0.9% FLUSH: 10.0000 mL | INTRAVENOUS | Status: DC | PRN

## 2024-07-09 MED ORDER — FUROSEMIDE 10 MG/ML IJ SOLN
120.0000 mg | Freq: Once | INTRAVENOUS | Status: AC
  Administered 2024-07-09: 120 mg via INTRAVENOUS
  Filled 2024-07-09: qty 12

## 2024-07-09 NOTE — Inpatient Diabetes Management (Signed)
 Inpatient Diabetes Program Recommendations  AACE/ADA: New Consensus Statement on Inpatient Glycemic Control (2015)  Target Ranges:  Prepandial:   less than 140 mg/dL      Peak postprandial:   less than 180 mg/dL (1-2 hours)      Critically ill patients:  140 - 180 mg/dL    Latest Reference Range & Units 07/08/24 08:21 07/08/24 12:17 07/08/24 16:44 07/08/24 21:25  Glucose-Capillary 70 - 99 mg/dL 766 (H) 807 (H) 849 (H) 192 (H)  (H): Data is abnormally high  Latest Reference Range & Units 07/09/24 08:37  Glucose-Capillary 70 - 99 mg/dL 757 (H)  (H): Data is abnormally high   Home DM Meds: Farxiga  10 mg daily Lantus  50 units q HS (reduce dose by 2 units if less than 90 mg/dL fasting) Humalog  20 units tid with meals  Mounjaro  12.5 mg weekly     Current Orders: Semglee  30 units daily Novolog  10 units tid with meals Novolog  0-20 units tid with meals and HS Farxiga  10 mg daily       MD- May consider:   Increase the Semglee  to 35 units daily   --Will follow patient during hospitalization--  Adina Rudolpho Arrow RN, MSN, CDCES Diabetes Coordinator Inpatient Glycemic Control Team Team Pager: [phone] (8a-5p)

## 2024-07-09 NOTE — Progress Notes (Addendum)
 Rounding Note   Patient Name: Gabriel [name]. Date of Encounter: 07/09/2024  [facility] Cardiologist: [name], MD   Subjective Patient resting in bed, reports he did well overnight.  Was able to lay flat for some period.  Only occasional dyspnea.  Feels that his abdomen is much less distended.  255 pounds today.  On chart review, difficult to tell his dry weight, but it seems to be around 250 pounds.  Scheduled Meds:  allopurinol   300 mg Oral Daily   amiodarone   200 mg Oral Daily   aspirin  EC  81 mg Oral Daily   carvedilol   3.125 mg Oral BID WC   dapagliflozin  propanediol  10 mg Oral Daily   ezetimibe   10 mg Oral Daily   insulin  aspart  0-20 Units Subcutaneous TID WC   insulin  aspart  0-5 Units Subcutaneous QHS   insulin  aspart  10 Units Subcutaneous TID WC   insulin  glargine-yfgn  30 Units Subcutaneous Daily   ipratropium  2 spray Each Nare Q8H   pantoprazole   40 mg Oral Daily   rivaroxaban   20 mg Oral Q supper   rosuvastatin   20 mg Oral Daily   sodium chloride  flush  3 mL Intravenous Q12H   spironolactone   25 mg Oral Daily   Continuous Infusions:  PRN Meds: acetaminophen , hydrALAZINE , ipratropium-albuterol , meclizine , ondansetron  (ZOFRAN ) IV, sodium chloride  flush   Vital Signs  Vitals:   07/08/24 2030 07/09/24 0010 07/09/24 0411 07/09/24 0500  BP: 127/75 (!) 128/91 117/77   Pulse: 87 84 85   Resp: 20 20 20    Temp: 98 F (36.7 C) (!) 97.5 F (36.4 C) 97.8 F (36.6 C)   TempSrc:      SpO2: 94% 94% 92%   Weight:    115.8 kg  Height:        Intake/Output Summary (Last 24 hours) at 07/09/2024 0801 Last data filed at 07/09/2024 0500 Gross per 24 hour  Intake 360 ml  Output 1900 ml  Net -1540 ml      07/09/2024    5:00 AM 07/08/2024    5:00 AM 07/07/2024    3:33 AM  Last 3 Weights  Weight (lbs) 255 lb 4.7 oz 256 lb 6.3 oz 261 lb 3.9 oz  Weight (kg) 115.8 kg 116.3 kg 118.5 kg      Telemetry Occasional brief salvos of PAT, PVCs,  NSVT  ECG   - No new tracings  Physical Exam  GEN: No acute distress.   Neck: Unable to assess JVD given body habitus Cardiac: RRR, no murmurs, rubs, or gallops.  Respiratory: Clear to auscultation bilaterally. GI: nontender, distended but soft MS: No edema; No deformity. Neuro:  Nonfocal  Psych: Normal affect   Labs High Sensitivity Troponin:  No results for input(s): TROPONINIHS in the last 720 hours.   Chemistry Recent Labs  Lab 07/06/24 0525 07/07/24 0547 07/08/24 0432  NA 138 138 139  K 4.0 3.8 3.7  CL 104 98 94*  CO2 19* 26 28  GLUCOSE 226* 215* 182*  BUN 18 28* 51*  CREATININE 1.34* 1.52* 2.14*  CALCIUM  9.7 10.1 10.0  MG  --   --  2.3  PROT 7.5  --   --   ALBUMIN  4.2  --   --   AST 22  --   --   ALT 21  --   --   ALKPHOS 62  --   --   BILITOT 0.8  --   --  GFRNONAA >60 54* 36*  ANIONGAP 14 14 16*    Lipids No results for input(s): CHOL, TRIG, HDL, LABVLDL, LDLCALC, CHOLHDL in the last 168 hours.  Hematology Recent Labs  Lab 07/06/24 0525  WBC 9.3  RBC 5.71  HGB 15.8  HCT 48.9  MCV 85.6  MCH 27.7  MCHC 32.3  RDW 13.2  PLT 169   Thyroid  No results for input(s): TSH, FREET4 in the last 168 hours.  BNP Recent Labs  Lab 07/06/24 0613  PROBNP 2,488.0*    DDimer No results for input(s): DDIMER in the last 168 hours.   Radiology  No results found.  Cardiac Studies -RHC 06/23/2024 RA 3, mean PA 24, PCWP 22, Fick/TD 1.8/2.0, PAP 6.3 -Monitor 03/2024 mean heart rate 78 bpm, 11 episodes of SVT (longest 8 beats), 7 episodes of NSVT (longest 7 beats), rare ectopy, no atrial fibrillation - TTE 12/2023 LVEF 35 to 40%, low normal RV function, mild to moderate AI, aortic root dilation to 40 mm -TTE 10/2023 EF 35 to 40% - Cath 07/2023 CTO proximal RCA, moderate LAD and LCx disease - TTE 02/2023 LVEF 45 to 50%, mild AI  Patient Profile   Gabriel [name]. is a 53 y.o. 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  A/P: Acute decompensated heart failure Heart failure with reduced ejection fraction Mixed ischemic and nonischemic cardiomyopathy Somewhat difficult volume exam given body habitus.  He was able to lay flat overnight and does not appear grossly volume overloaded.  Unable to assess neck veins accurately.  He is warm and perfusing today.  Lasix  and some of his GDMT held yesterday due to worsening renal function.  Previous right heart cath have shown low cardiac output.  Historical dry weights have been around 250 pounds, and he is 255 pounds today, suggesting that he still may have some volume left to diurese.  Plan: -Will await a.m. labs to monitor renal function.  If the anion gap is elevated, then we should check a lactic acid to make sure he is not in a low output state. -Continue Coreg  3.125 mg daily, Farxiga  10 mg daily, spironolactone  25 mg daily.  Continue to hold Entresto  for now given hypotension and renal dysfunction.  If his kidney function is worsening today, then we will probably need to hold the spironolactone  as well. -If his renal function appears better today, then we can continue with diuresis -Will consider milrinone  if he seems like he has a low output state; this should help facilitate diuresis -If we continue to struggle to assess his volume status, then we should do a right heart cath on Monday.  Will need to hold Xarelto  for 24 hours beforehand. -Advanced heart failure has already evaluated the patient; he is not a candidate for advanced therapies   Paroxysmal atrial fibrillation Ablation deferred by EP on office visit October 2025 Maintaining normal sinus rhythm Continue Xarelto , amiodarone , Coreg     Coronary artery disease with stable  angina Cardiac catheterization August 19, 2023, stable  compared to prior study 2021 -Continue Zetia , Crestor  20, Xarelto  in place of aspirin  -Denies chest pain concerning for angina  Continued CHF education, case discussed with heart failure clinic  For questions or updates, please contact [facility] Please consult [email or web address] for contact info under  Signed, Caron Poser, MD  07/09/2024, 8:01 AM     Addendum 07/09/24 at 1726:  Labs show lactic acid of 2.6.  This suggest that he is likely in a low output state/early cardiogenic shock.  He had a PICC line placed.  Difficult volume exam, though favor him needing additional diuresis.  Plan: - Continue milrinone  0.125 - Lasix  120 mg IV once tonight - Hold GDMT - Trend lactic acid; if not clearing, he may need higher dose of milrinone  - Trend Co. Ox - If he is not improving, then it may be a good idea to move him to the ICU so that continuous CVP can be measured off of his PICC line

## 2024-07-09 NOTE — Progress Notes (Signed)
 Progress Note   Patient: Gabriel [name]. FMW:[id number] DOB: [birth year] DOA: 07/06/2024     2 DOS: the patient was seen and examined on 07/09/2024   Brief hospital course: 53 y.o. male with medical history significant of chronic combined HFrEF and HFpEF, pulmonary hypertension, COPD, nonobstructive CAD, status post ICD, PE on Xarelto , OSA on CPAP, morbid obesity, HTN, IDDM, cirrhosis secondary to NASH, CKD stage IIIa, presented with increasing shortness of breath and abdominal pain.   Symptoms started 4-5 days ago, patient started to notice intermittent  stretching like abdominal pain, worsening with torso movement.  He also noticed increasing girth of his abdomen and he gained more than 10 pounds in last 7 days.  He tried to increase his torsemide  dosage to twice daily with no significant improvement.  Yesterday he started develop wheezing and dry cough and increasing shortness of breath and could not lie flat last night because of shortness of breath.  Denies any chest pain no fever or chills.  No ankle edema.   ED Course: Afebrile, borderline tachycardia blood pressure elevated 170/97 95% on room air.  CT chest abdomen showed no acute findings, blood work showed BUN 18 creatinine 1.3 glucose 220, bicarb 19, WBC 9.2 hemoglobin 15.8.  11/19.  Patient states he was 230 pounds back in January.  Yesterday's weight was 270 pounds.  Patient feels a little bit better today but short of breath.  Cardiology increased Lasix  to 3 times daily dosing 11/20.  Creatinine up to 2.14 hold Lasix  today. 11/21.  Creatinine still elevated at 1.94.  Holding Lasix  and Entresto  currently.  Patient's weight 255.29 pounds.  Will check lactic acid.  Assessment and Plan: * Acute on chronic systolic CHF (congestive heart failure) [facility]) Cardiology increase Lasix  to 80 mg IV every 8 hours on 11/19.  With creatinine rise held Lasix  on 11/20 and 11/21.  Holding Entresto  today.  Continue spironolactone , Farxiga  and  low-dose Coreg .  Abdominal swelling Continue spironolactone   Acute kidney injury superimposed on CKD AKI on CKD stage IIIa.  Creatinine rose to 2.14 on 11/20.  Today's creatinine 1.94.  Creatinine 1.34 on presentation.  PAF (paroxysmal atrial fibrillation) (HCC) On amiodarone  and Xarelto   Type 2 diabetes mellitus with hyperlipidemia (HCC) With hyperglycemia also.  Patient on Crestor  and 30 units of Semglee  insulin  and 10 units of NovoLog  3 times daily  NASH (nonalcoholic steatohepatitis) Outpatient follow-up.  Obesity (BMI 30-39.9) BMI 35.61, class II obesity.  History of pulmonary embolism On Xarelto   Polycythemia vera (HCC) Follow-up as outpatient  COPD (chronic obstructive pulmonary disease) (HCC) Respiratory status stable.        Subjective: Patient does feel better since when he came in.  Fluid status difficult to determine secondary to body habitus.  Admitted with CHF with shortness of breath and abdominal distention.  Physical Exam: Vitals:   07/09/24 0411 07/09/24 0500 07/09/24 0836 07/09/24 1143  BP: 117/77  134/84 133/79  Pulse: 85  88 80  Resp: 20  16 16   Temp: 97.8 F (36.6 C)  97.6 F (36.4 C) 97.6 F (36.4 C)  TempSrc:      SpO2: 92%  94% 96%  Weight:  115.8 kg    Height:       Physical Exam HENT:     Head: Normocephalic.     Mouth/Throat:     Pharynx: No oropharyngeal exudate.  Eyes:     General: Lids are normal.     Conjunctiva/sclera: Conjunctivae normal.  Cardiovascular:  Rate and Rhythm: Normal rate and regular rhythm.     Heart sounds: Normal heart sounds, S1 normal and S2 normal.  Pulmonary:     Breath sounds: Examination of the right-lower field reveals decreased breath sounds and rhonchi. Examination of the left-lower field reveals decreased breath sounds and rhonchi. Decreased breath sounds and rhonchi present. No wheezing or rales.  Abdominal:     General: There is distension.     Palpations: Abdomen is soft.      Tenderness: There is no abdominal tenderness.  Musculoskeletal:     Right lower leg: No swelling.     Left lower leg: No swelling.  Skin:    General: Skin is warm.     Findings: No rash.  Neurological:     Mental Status: He is alert and oriented to person, place, and time.     Data Reviewed: Sodium 134, potassium 3.4, chloride 94, creatinine 1.94, BUN 64.,  proBNP 143, anion gap 16  Disposition: Status is: Inpatient Remains inpatient appropriate because: Checking a lactic acid.  Cardiology may decide on milrinone .  Holding Lasix  but likely will still need more diuresis.  May end up needing a right heart cath on Monday.  Planned Discharge Destination: Home    Time spent: 28 minutes  Author: Charlie Patterson, MD 07/09/2024 1:12 PM  For on call review [email or web address].

## 2024-07-09 NOTE — Progress Notes (Addendum)
 Heart Failure Stewardship Pharmacy Note  PCP: Franchot Isaiah LABOR, MD PCP-Cardiologist: [name], MD  HPI: Gorje Iyer. is a 53 y.o. male with atrial fibrillation, HTN, T2DM, COPD, CKD, asthma, NSVT, OSA, unprovoked PE, pulmonary hypertension, psoriasis, migraine, NASH, ICD placement 05/2022, and CHF who presented with upper abdominal pain, abdominal distention, and shortness of breath. On admission, proBNP was 2488, CO2 was 19, HS-troponin was 42, LFTs WNL, and lipase was 61. Chest x-ray noted no acute findings.   Pertinent cardiac history: RHC/LHC in 05/2020 noted CTO of proximal RCA filled via elft to right collaterals, moderate PH (PVR 7.4 WU), and severely reduced cardiac output/index. TTE 05/2021 with LVEF 40-45%. ICD placed 05/2022. TTE 07/2022 EF 40-45% with mild LVH, G1DD, severe hypokinesis of left ventricular, entire inferior and inferolateral wall. TTE 03/08/23 EF 45-50% with moderate LVH, trivial MR, mild dilatation of aortic root at 43 mm. TTE 10/2023 with LVEF 35-40%, global LV hypokinesis, severely dilated LV internal cavity size, mild LVH, small pericardial effusion, mild MR. TTE 12/2023 with LVEF 35-40%, mild LVH, low normal RV, mild LAE, mild/ moderate AR, mild dilatation of the aortic root, measuring 40 mm. RHC 06/2024 noted RA of 3, PA 34/15 (24), CO 4.3 + CI 1.8 via fick, CO 4.8 + CI 2 via thermo, PCWP 22, PAPi 6.3.   Pertinent Lab Values: Creatinine  Date Value Ref Range Status  09/29/2023 1.20 0.61 - 1.24 mg/dL Final  97/80/7984 9.22 0.60 - 1.30 mg/dL Final   Creatinine, Ser  Date Value Ref Range Status  07/08/2024 2.14 (H) 0.61 - 1.24 mg/dL Final   BUN  Date Value Ref Range Status  07/08/2024 51 (H) 6 - 20 mg/dL Final  91/80/7974 43 (H) 6 - 24 mg/dL Final  97/80/7984 10 7 - 18 mg/dL Final   Potassium  Date Value Ref Range Status  07/08/2024 3.7 3.5 - 5.1 mmol/L Final  10/07/2013 3.8 3.5 - 5.1 mmol/L Final   Sodium  Date Value Ref Range Status   07/08/2024 139 135 - 145 mmol/L Final  04/06/2024 137 134 - 144 mmol/L Final  10/07/2013 139 136 - 145 mmol/L Final   B Natriuretic Peptide  Date Value Ref Range Status  06/28/2024 143.3 (H) 0.0 - 100.0 pg/mL Final    Comment:    Performed at [facility], [address] [place], [place] [address]   Magnesium   Date Value Ref Range Status  07/08/2024 2.3 1.7 - 2.4 mg/dL Final    Comment:    Performed at [facility], [address] [place], [place] [address]   Hemoglobin A1C  Date Value Ref Range Status  04/02/2024 8.8 (A) 4.0 - 5.6 % Final   Hgb A1c MFr Bld  Date Value Ref Range Status  01/01/2024 6.9 (H) 4.8 - 5.6 % Final    Comment:             Prediabetes: 5.7 - 6.4          Diabetes: >6.4          Glycemic control for adults with diabetes: <7.0    TSH  Date Value Ref Range Status  04/06/2024 1.600 0.450 - 4.500 uIU/mL Final    Vital Signs:  Temp:  [97.4 F (36.3 C)-98.1 F (36.7 C)] 97.8 F (36.6 C) (11/21 0411) Pulse Rate:  [84-96] 85 (11/21 0411) Cardiac Rhythm: Normal sinus rhythm (11/21 0800) Resp:  [18-20] 20 (11/21 0411) BP: (97-128)/(56-91) 117/77 (11/21 0411) SpO2:  [92 %-96 %] 92 % (  11/21 0411) Weight:  [115.8 kg (255 lb 4.7 oz)] 115.8 kg (255 lb 4.7 oz) (11/21 0500)  Intake/Output Summary (Last 24 hours) at 07/09/2024 9178 Last data filed at 07/09/2024 0500 Gross per 24 hour  Intake 360 ml  Output 1500 ml  Net -1140 ml    Current Heart Failure Medications:  Loop diuretic: none Beta-Blocker: carvedilol  3.125 mg BID ACEI/ARB/ARNI: none MRA: spironolactone  25 mg daily SGLT2i: Farxiga  10 mg daily Other: none  Prior to admission Heart Failure Medications:  Loop diuretic: torsemide  80 mg daily Beta-Blocker: carvedilol  3.125 mg BID ACEI/ARB/ARNI: Entresto  49-51 mg BID MRA: spironolactone  25 mg daily SGLT2i: Farxiga  10 mg daily Other: none  Assessment: 1. Acute on chronic systolic heart failure (LVEF 35-40%) with  known low cardiac output, due to NICM. NYHA class III symptoms.  -Symptoms: Reports shortness of breath is stable from yesterday. Abdominal pain is improving, still reports fullness. Appetite is fair. Reports dry weight was ~230 lbs several months ago. No significant LEE. -Volume: Remains hypervolemic, creatinine mildly improved but BUN up after holding diuresis. This is less likely due to inadequate vascular refill time given labs after holding Lasix  and more so low output HF vs ATN. REDS elevated yesterday at 44%. Would consider adding milrinone  0.125 and restarting furosemide  at 120 mg IV BID with metolazone  5 mg 2 hours after. -Hemodynamics: BP elevated today after holding Entresto . HR 90s. CO2 was 19 on admission labs, likely had mild lactic acidosis, but this improved. LFTs were WNL and pulse pressures were not narrow. Lactic acid pending. -BB: Would consider holding carvedilol  if milrinone  is started. -ACEI/ARB/ARNI: Entresto  held given lower BP and AKI. Can consider resuming when resolved. -MRA: Continue home spironolactone  25 mg daily -SGLT2i: Continue home Farxiga  10 mg daily  Plan: 1) Medication changes recommended at this time: - If lactic acid is elevated, consider adding milrinone  0.125 mcg/kg/min, holding Coreg , and restarting furosemide  at 120 mg IV BID (or gtt at 20 mg/hr after 1 bolus) with metolazone  5 mg. If lactic acid is normal, may need to explore other causes for symptoms.  2) Patient assistance: -Pending  3) Education: - Patient has been educated on current HF medications and potential additions to HF medication regimen - Patient verbalizes understanding that over the next few months, these medication doses may change and more medications may be added to optimize HF regimen - Patient has been educated on basic disease state pathophysiology and goals of therapy  Please do not hesitate to reach out with questions or concerns,  Nassir Neidert, PharmD, CPP, BCPS, [facility] Heart  Failure Pharmacist  Phone - [phone] 07/09/2024 8:21 AM

## 2024-07-09 NOTE — Progress Notes (Signed)
 Peripherally Inserted Central Catheter Placement  The IV Nurse has discussed with the patient and/or persons authorized to consent for the patient, the purpose of this procedure and the potential benefits and risks involved with this procedure.  The benefits include less needle sticks, lab draws from the catheter, and the patient may be discharged home with the catheter. Risks include, but not limited to, infection, bleeding, blood clot (thrombus formation), and puncture of an artery; nerve damage and irregular heartbeat and possibility to perform a PICC exchange if needed/ordered by physician.  Alternatives to this procedure were also discussed.  Bard Power PICC patient education guide, fact sheet on infection prevention and patient information card has been provided to patient /or left at bedside.    PICC Placement Documentation  PICC Double Lumen 07/09/24 Right Brachial 44 cm 1 cm (Active)  Indication for Insertion or Continuance of Line Vasoactive infusions 07/09/24 1710  Exposed Catheter (cm) 1 cm 07/09/24 1710  Site Assessment Clean, Dry, Intact 07/09/24 1710  Lumen #1 Status Flushed;Saline locked;Blood return noted 07/09/24 1710  Lumen #2 Status Flushed;Saline locked;Blood return noted 07/09/24 1710  Dressing Type Transparent;Securing device 07/09/24 1710  Dressing Status Antimicrobial disc/dressing in place;Clean, Dry, Intact 07/09/24 1710  Line Care Connections checked and tightened 07/09/24 1710  Line Adjustment (NICU/IV Team Only) No 07/09/24 1710  Dressing Intervention New dressing;Adhesive placed at insertion site (IV team only) 07/09/24 1710  Dressing Change Due 07/16/24 07/09/24 1710       Renaee Notice Albarece 07/09/2024, 5:26 PM

## 2024-07-09 NOTE — Plan of Care (Signed)

## 2024-07-10 DIAGNOSIS — I5023 Acute on chronic systolic (congestive) heart failure: Secondary | ICD-10-CM | POA: Diagnosis not present

## 2024-07-10 DIAGNOSIS — R570 Cardiogenic shock: Secondary | ICD-10-CM

## 2024-07-10 DIAGNOSIS — I428 Other cardiomyopathies: Secondary | ICD-10-CM | POA: Diagnosis not present

## 2024-07-10 DIAGNOSIS — R1900 Intra-abdominal and pelvic swelling, mass and lump, unspecified site: Secondary | ICD-10-CM | POA: Diagnosis not present

## 2024-07-10 DIAGNOSIS — E876 Hypokalemia: Secondary | ICD-10-CM

## 2024-07-10 DIAGNOSIS — I25118 Atherosclerotic heart disease of native coronary artery with other forms of angina pectoris: Secondary | ICD-10-CM | POA: Diagnosis not present

## 2024-07-10 DIAGNOSIS — N179 Acute kidney failure, unspecified: Secondary | ICD-10-CM | POA: Diagnosis not present

## 2024-07-10 DIAGNOSIS — I480 Paroxysmal atrial fibrillation: Secondary | ICD-10-CM | POA: Diagnosis not present

## 2024-07-10 LAB — GLUCOSE, CAPILLARY
Glucose-Capillary: 229 mg/dL — ABNORMAL HIGH (ref 70–99)
Glucose-Capillary: 218 mg/dL — ABNORMAL HIGH (ref 70–99)
Glucose-Capillary: 270 mg/dL — ABNORMAL HIGH (ref 70–99)
Glucose-Capillary: 235 mg/dL — ABNORMAL HIGH (ref 70–99)

## 2024-07-10 LAB — BASIC METABOLIC PANEL WITH GFR
Anion gap: 14 (ref 5–15)
Anion gap: 15 (ref 5–15)
BUN: 69 mg/dL — ABNORMAL HIGH (ref 6–20)
BUN: 61 mg/dL — ABNORMAL HIGH (ref 6–20)
CO2: 29 mmol/L (ref 22–32)
CO2: 29 mmol/L (ref 22–32)
Calcium: 9.8 mg/dL (ref 8.9–10.3)
Calcium: 9.5 mg/dL (ref 8.9–10.3)
Chloride: 92 mmol/L — ABNORMAL LOW (ref 98–111)
Chloride: 89 mmol/L — ABNORMAL LOW (ref 98–111)
Creatinine, Ser: 1.98 mg/dL — ABNORMAL HIGH (ref 0.61–1.24)
Creatinine, Ser: 1.98 mg/dL — ABNORMAL HIGH (ref 0.61–1.24)
GFR, Estimated: 39 mL/min — ABNORMAL LOW (ref >60)
GFR, Estimated: 39 mL/min — ABNORMAL LOW (ref >60)
Glucose, Bld: 208 mg/dL — ABNORMAL HIGH (ref 70–99)
Glucose, Bld: 255 mg/dL — ABNORMAL HIGH (ref 70–99)
Potassium: 3.4 mmol/L — ABNORMAL LOW (ref 3.5–5.1)
Potassium: 3.7 mmol/L (ref 3.5–5.1)
Sodium: 135 mmol/L (ref 135–145)
Sodium: 133 mmol/L — ABNORMAL LOW (ref 135–145)

## 2024-07-10 LAB — LACTIC ACID, PLASMA: Lactic Acid, Venous: 1.6 mmol/L (ref 0.5–1.9)

## 2024-07-10 LAB — MAGNESIUM: Magnesium: 2.3 mg/dL (ref 1.7–2.4)

## 2024-07-10 MED ORDER — FUROSEMIDE 10 MG/ML IJ SOLN
120.0000 mg | Freq: Once | INTRAVENOUS | Status: AC
  Administered 2024-07-10: 120 mg via INTRAVENOUS
  Filled 2024-07-10: qty 12

## 2024-07-10 MED ORDER — POTASSIUM CHLORIDE CRYS ER 20 MEQ PO TBCR
40.0000 meq | EXTENDED_RELEASE_TABLET | Freq: Once | ORAL | Status: AC
  Administered 2024-07-10: 40 meq via ORAL
  Filled 2024-07-10: qty 2

## 2024-07-10 MED ORDER — INSULIN GLARGINE-YFGN 100 UNIT/ML ~~LOC~~ SOLN
34.0000 [IU] | Freq: Every day | SUBCUTANEOUS | Status: DC
  Administered 2024-07-11: 34 [IU] via SUBCUTANEOUS
  Filled 2024-07-10: qty 0.34

## 2024-07-10 NOTE — Assessment & Plan Note (Addendum)
 Patient started on milrinone  yesterday afternoon with elevated lactic acid.  Lactic acid has improved.  Continue milrinone  drip.  Lasix  120 mg IV.  Last EF 35 to 40%.

## 2024-07-10 NOTE — Assessment & Plan Note (Signed)
-  Potassium supplementation 

## 2024-07-10 NOTE — Progress Notes (Signed)
 Progress Note   Patient: Gabriel [name]. FMW:[id number] DOB: 1970-09-12 DOA: 07/06/2024     3 DOS: the patient was seen and examined on 07/10/2024   Brief hospital course: 53 y.o. male with medical history significant of chronic combined HFrEF and HFpEF, pulmonary hypertension, COPD, nonobstructive CAD, status post ICD, PE on Xarelto , OSA on CPAP, morbid obesity, HTN, IDDM, cirrhosis secondary to NASH, CKD stage IIIa, presented with increasing shortness of breath and abdominal pain.   Symptoms started 4-5 days ago, patient started to notice intermittent  stretching like abdominal pain, worsening with torso movement.  He also noticed increasing girth of his abdomen and he gained more than 10 pounds in last 7 days.  He tried to increase his torsemide  dosage to twice daily with no significant improvement.  Yesterday he started develop wheezing and dry cough and increasing shortness of breath and could not lie flat last night because of shortness of breath.  Denies any chest pain no fever or chills.  No ankle edema.   ED Course: Afebrile, borderline tachycardia blood pressure elevated 170/97 95% on room air.  CT chest abdomen showed no acute findings, blood work showed BUN 18 creatinine 1.3 glucose 220, bicarb 19, WBC 9.2 hemoglobin 15.8.  11/19.  Patient states he was 230 pounds back in January.  Yesterday's weight was 270 pounds.  Patient feels a little bit better today but short of breath.  Cardiology increased Lasix  to 3 times daily dosing 11/20.  Creatinine up to 2.14 hold Lasix  today. 11/21.  Creatinine still elevated at 1.94.  Holding Lasix  and Entresto  currently.  Patient's weight 255.29 pounds.  Will check lactic acid.  Assessment and Plan: * Cardiogenic shock (HCC) Patient started on milrinone  yesterday afternoon with elevated lactic acid.  Lactic acid has improved.  Continue milrinone  drip.  Lasix  120 mg IV.  Last EF 35 to 40%.  Acute on chronic systolic CHF (congestive heart  failure) [facility]) Cardiology increase Lasix  to 80 mg IV every 8 hours on 11/19.  With creatinine rise held Lasix  on 11/20.  Lasix  restarted with milrinone  dosing.  Holding Entresto  today.  Holding spironolactone .  Holding low-dose Coreg  (while on milrinone ).  Patient on Farxiga .  Abdominal swelling Holding spironolactone   Acute kidney injury superimposed on CKD AKI on CKD stage IIIa.  Creatinine rose to 2.14 on 11/20.  Today's creatinine 1.99.  Creatinine 1.34 on presentation.  Monitor closely on milrinone  drip and Lasix .  PAF (paroxysmal atrial fibrillation) (HCC) On amiodarone  and Xarelto   Type 2 diabetes mellitus with hyperlipidemia (HCC) With hyperglycemia also.  Patient on Crestor  and 34 units of Semglee  insulin  and 10 units of NovoLog  3 times daily  Hypokalemia Potassium supplementation  NASH (nonalcoholic steatohepatitis) Outpatient follow-up.  Obesity (BMI 30-39.9) BMI 35.58, class II obesity.  History of pulmonary embolism On Xarelto   Polycythemia vera (HCC) Follow-up as outpatient  COPD (chronic obstructive pulmonary disease) (HCC) Respiratory status stable.        Subjective: Patient states he feels a little better since coming in.  Patient on milrinone  drip and Lasix .  Patient admitted with CHF exacerbation.  Physical Exam: Vitals:   07/10/24 0743 07/10/24 1015 07/10/24 1117 07/10/24 1200  BP: (!) 158/86  93/64 (!) 144/96  Pulse: 90   89  Resp: 20     Temp: (!) 97.5 F (36.4 C)   97.8 F (36.6 C)  TempSrc:      SpO2: 93%   97%  Weight:  115.7 kg    Height:  Physical Exam HENT:     Head: Normocephalic.     Mouth/Throat:     Pharynx: No oropharyngeal exudate.  Eyes:     General: Lids are normal.     Conjunctiva/sclera: Conjunctivae normal.  Cardiovascular:     Rate and Rhythm: Normal rate and regular rhythm.     Heart sounds: Normal heart sounds, S1 normal and S2 normal.  Pulmonary:     Breath sounds: Examination of the right-lower field  reveals decreased breath sounds. Examination of the left-lower field reveals decreased breath sounds. Decreased breath sounds present. No wheezing, rhonchi or rales.  Abdominal:     General: There is distension.     Palpations: Abdomen is soft.     Tenderness: There is no abdominal tenderness.  Musculoskeletal:     Right lower leg: No swelling.     Left lower leg: No swelling.  Skin:    General: Skin is warm.     Findings: No rash.  Neurological:     Mental Status: He is alert and oriented to person, place, and time.     Data Reviewed: Potassium 3.4, creatinine 1.98, GFR 39, lactic acid 1.6   Disposition: Status is: Inpatient Remains inpatient appropriate because: Patient on milrinone  drip and IV Lasix  for cardiogenic shock.  Very difficult balance with creatinine and obesity and heart failure.  Planned Discharge Destination: Home    Time spent: 28 minutes  Author: Charlie Patterson, MD 07/10/2024 1:56 PM  For on call review [email or web address].

## 2024-07-10 NOTE — Progress Notes (Signed)
 Rounding Note   Patient Name: Gabriel [name]. Date of Encounter: 07/10/2024  [facility] Cardiologist: Timothy Gollan, MD   Subjective Cleared lactic overnight with low-dose milrinone  and lasix . -1300cc. Cr stable. BUN still quite elevated.  He reports he feels about the same as yesterday, maybe slightly better.  Scheduled Meds:  allopurinol   300 mg Oral Daily   amiodarone   200 mg Oral Daily   aspirin  EC  81 mg Oral Daily   Chlorhexidine  Gluconate Cloth  6 each Topical Daily   dapagliflozin  propanediol  10 mg Oral Daily   ezetimibe   10 mg Oral Daily   insulin  aspart  0-20 Units Subcutaneous TID WC   insulin  aspart  0-5 Units Subcutaneous QHS   insulin  aspart  10 Units Subcutaneous TID WC   insulin  glargine-yfgn  30 Units Subcutaneous Daily   ipratropium  2 spray Each Nare Q8H   pantoprazole   40 mg Oral Daily   rivaroxaban   20 mg Oral Q supper   rosuvastatin   20 mg Oral Daily   sodium chloride  flush  10-40 mL Intracatheter Q12H   sodium chloride  flush  3 mL Intravenous Q12H   Continuous Infusions:  milrinone  0.125 mcg/kg/min (07/09/24 1513)   PRN Meds: acetaminophen , hydrALAZINE , ipratropium-albuterol , meclizine , ondansetron  (ZOFRAN ) IV, sodium chloride  flush, sodium chloride  flush   Vital Signs  Vitals:   07/09/24 2001 07/10/24 0107 07/10/24 0500 07/10/24 0520  BP: 123/87 122/88 120/76   Pulse: 90     Resp: 18 18 18    Temp: 98.1 F (36.7 C) 98.3 F (36.8 C) 98.2 F (36.8 C)   TempSrc: Oral Oral Oral   SpO2: 94% 96% 96%   Weight:    115 kg  Height:        Intake/Output Summary (Last 24 hours) at 07/10/2024 0654 Last data filed at 07/10/2024 0645 Gross per 24 hour  Intake 840 ml  Output 2150 ml  Net -1310 ml      07/10/2024    5:20 AM 07/09/2024    5:00 AM 07/08/2024    5:00 AM  Last 3 Weights  Weight (lbs) 253 lb 8.5 oz 255 lb 4.7 oz 256 lb 6.3 oz  Weight (kg) 115 kg 115.8 kg 116.3 kg      Telemetry Occasional brief salvos of PAT,  PVCs, NSVT  ECG   - No new tracings  Physical Exam  GEN: No acute distress.   Neck: Unable to assess JVD given body habitus Cardiac: RRR, no murmurs, rubs, or gallops.  Respiratory: Clear to auscultation bilaterally. GI: nontender, distended but soft MS: No edema; No deformity. Neuro:  Nonfocal  Psych: Normal affect   Labs High Sensitivity Troponin:  No results for input(s): TROPONINIHS in the last 720 hours.   Chemistry Recent Labs  Lab 07/06/24 0525 07/07/24 0547 07/08/24 0432 07/09/24 0748 07/09/24 1343 07/10/24 0620  NA 138   < > 139 134*  --  135  K 4.0   < > 3.7 3.4*  --  3.4*  CL 104   < > 94* 94*  --  92*  CO2 19*   < > 28 24  --  29  GLUCOSE 226*   < > 182* 193*  --  208*  BUN 18   < > 51* 64*  --  69*  CREATININE 1.34*   < > 2.14* 1.94*  --  1.98*  CALCIUM  9.7   < > 10.0 9.9  --  9.8  MG  --   --  2.3 2.4  --  2.3  PROT 7.5  --   --   --  7.7  --   ALBUMIN  4.2  --   --   --  4.2  --   AST 22  --   --   --  19  --   ALT 21  --   --   --  15  --   ALKPHOS 62  --   --   --  72  --   BILITOT 0.8  --   --   --  1.0  --   GFRNONAA >60   < > 36* 40*  --  39*  ANIONGAP 14   < > 16* 16*  --  14   < > = values in this interval not displayed.    Lipids No results for input(s): CHOL, TRIG, HDL, LABVLDL, LDLCALC, CHOLHDL in the last 168 hours.  Hematology Recent Labs  Lab 07/06/24 0525  WBC 9.3  RBC 5.71  HGB 15.8  HCT 48.9  MCV 85.6  MCH 27.7  MCHC 32.3  RDW 13.2  PLT 169   Thyroid  No results for input(s): TSH, FREET4 in the last 168 hours.  BNP Recent Labs  Lab 07/06/24 0613 07/09/24 0748  PROBNP 2,488.0* 143.0    DDimer No results for input(s): DDIMER in the last 168 hours.   Radiology  US  EKG SITE RITE Result Date: 07/09/2024 If Site Rite image not attached, placement could not be confirmed due to current cardiac rhythm.   Cardiac Studies -RHC 06/23/2024 RA 3, mean PA 24, PCWP 22, Fick/TD 1.8/2.0, PAP 6.3 -Monitor  03/2024 mean heart rate 78 bpm, 11 episodes of SVT (longest 8 beats), 7 episodes of NSVT (longest 7 beats), rare ectopy, no atrial fibrillation - TTE 12/2023 LVEF 35 to 40%, low normal RV function, mild to moderate AI, aortic root dilation to 40 mm -TTE 10/2023 EF 35 to 40% - Cath 07/2023 CTO proximal RCA, moderate LAD and LCx disease - TTE 02/2023 LVEF 45 to 50%, mild AI  Patient Profile   Gabriel [name]. is a 53 y.o. male with a hx of ischemic and nonischemic cardiomyopathy status post subcutaneous ICD, frequent hospital admissions for heart failure exacerbation, CAD with known CTO RCA and moderate LAD/LCx disease, COPD/long smoking history, paroxysmal atrial fibrillation, presenting with acute decompensated heart failure.  A/P: Acute decompensated heart failure Heart failure with reduced ejection fraction Mixed ischemic and nonischemic cardiomyopathy Cardiogenic shock Somewhat difficult volume exam given body habitus.  Several discordant perfusion indices on exam and lab work.  Ultimately we obtained an lactic acid which was elevated, suggesting that he was in early low output/cardiogenic shock.  This improved with milrinone  and additional diuresis.  Since he is improving, we will try to manage him clinically for now.  I think we need to continue pushing inotrope and diuresis and target a dry weight of 245 to 250 pounds.  Plan: - Continue milrinone  0.125 -Will diurese again today; will start with Lasix  120 mg once and consider redosing in the p.m.  Goal 1 to 2 L net negative. -Would check another lactic acid and BMP later this p.m. to ensure we are continuing to maintain good perfusion and his electrolytes are tolerating the diuresis -Hold GDMT while giving inotropes; dapagliflozin  okay to continue - Depending on how he does today, we can consider RHC Monday for further guidance.  Would need to hold his Xarelto  dose tomorrow if this is the case. -  If things start heading in the other  direction, then we would want to transduce CVP's off of his PICC line for additional data points to guide diuresis   Paroxysmal atrial fibrillation Ablation deferred by EP on office visit October 2025 Maintaining normal sinus rhythm Continue Xarelto , amiodarone , Coreg     Coronary artery disease with stable angina Cardiac catheterization August 19, 2023, stable  compared to prior study 2021 -Continue Zetia , Crestor  20, Xarelto  in place of aspirin  -Denies chest pain concerning for angina   For questions or updates, please contact [facility] Please consult [email or web address] for contact info under     Signed, Caron Poser, MD  07/10/2024, 6:54 AM

## 2024-07-10 NOTE — Care Management Important Message (Signed)
 Important Message  Patient Details  Name: Gabriel [name]. MRN: [id number] Date of Birth: [birth year]   Important Message Given:  Yes - Medicare IM     [name] 07/10/2024, 12:05 PM

## 2024-07-11 DIAGNOSIS — I480 Paroxysmal atrial fibrillation: Secondary | ICD-10-CM | POA: Diagnosis not present

## 2024-07-11 DIAGNOSIS — I25118 Atherosclerotic heart disease of native coronary artery with other forms of angina pectoris: Secondary | ICD-10-CM | POA: Diagnosis not present

## 2024-07-11 DIAGNOSIS — I5023 Acute on chronic systolic (congestive) heart failure: Secondary | ICD-10-CM | POA: Diagnosis not present

## 2024-07-11 DIAGNOSIS — N179 Acute kidney failure, unspecified: Secondary | ICD-10-CM | POA: Diagnosis not present

## 2024-07-11 DIAGNOSIS — I428 Other cardiomyopathies: Secondary | ICD-10-CM | POA: Diagnosis not present

## 2024-07-11 DIAGNOSIS — R570 Cardiogenic shock: Secondary | ICD-10-CM | POA: Diagnosis not present

## 2024-07-11 DIAGNOSIS — R1900 Intra-abdominal and pelvic swelling, mass and lump, unspecified site: Secondary | ICD-10-CM | POA: Diagnosis not present

## 2024-07-11 LAB — BASIC METABOLIC PANEL WITH GFR
Anion gap: 14 (ref 5–15)
Anion gap: 15 (ref 5–15)
BUN: 67 mg/dL — ABNORMAL HIGH (ref 6–20)
BUN: 56 mg/dL — ABNORMAL HIGH (ref 6–20)
CO2: 29 mmol/L (ref 22–32)
CO2: 29 mmol/L (ref 22–32)
Calcium: 9.6 mg/dL (ref 8.9–10.3)
Calcium: 9.3 mg/dL (ref 8.9–10.3)
Chloride: 91 mmol/L — ABNORMAL LOW (ref 98–111)
Chloride: 89 mmol/L — ABNORMAL LOW (ref 98–111)
Creatinine, Ser: 1.97 mg/dL — ABNORMAL HIGH (ref 0.61–1.24)
Creatinine, Ser: 2.02 mg/dL — ABNORMAL HIGH (ref 0.61–1.24)
GFR, Estimated: 39 mL/min — ABNORMAL LOW (ref >60)
GFR, Estimated: 38 mL/min — ABNORMAL LOW (ref >60)
Glucose, Bld: 221 mg/dL — ABNORMAL HIGH (ref 70–99)
Glucose, Bld: 311 mg/dL — ABNORMAL HIGH (ref 70–99)
Potassium: 3.7 mmol/L (ref 3.5–5.1)
Potassium: 3.7 mmol/L (ref 3.5–5.1)
Sodium: 135 mmol/L (ref 135–145)
Sodium: 133 mmol/L — ABNORMAL LOW (ref 135–145)

## 2024-07-11 LAB — MAGNESIUM: Magnesium: 2.4 mg/dL (ref 1.7–2.4)

## 2024-07-11 LAB — GLUCOSE, CAPILLARY
Glucose-Capillary: 233 mg/dL — ABNORMAL HIGH (ref 70–99)
Glucose-Capillary: 231 mg/dL — ABNORMAL HIGH (ref 70–99)
Glucose-Capillary: 277 mg/dL — ABNORMAL HIGH (ref 70–99)
Glucose-Capillary: 350 mg/dL — ABNORMAL HIGH (ref 70–99)

## 2024-07-11 MED ORDER — POTASSIUM CHLORIDE CRYS ER 20 MEQ PO TBCR
40.0000 meq | EXTENDED_RELEASE_TABLET | Freq: Once | ORAL | Status: AC
  Administered 2024-07-11: 40 meq via ORAL
  Filled 2024-07-11: qty 2

## 2024-07-11 MED ORDER — FUROSEMIDE 10 MG/ML IJ SOLN
120.0000 mg | Freq: Once | INTRAVENOUS | Status: AC
  Administered 2024-07-11: 120 mg via INTRAVENOUS
  Filled 2024-07-11: qty 2

## 2024-07-11 MED ORDER — INSULIN GLARGINE-YFGN 100 UNIT/ML ~~LOC~~ SOLN
36.0000 [IU] | Freq: Every day | SUBCUTANEOUS | Status: DC
  Administered 2024-07-12 – 2024-07-14 (×3): 36 [IU] via SUBCUTANEOUS
  Filled 2024-07-11 (×3): qty 0.36

## 2024-07-11 NOTE — Progress Notes (Signed)
 Progress Note   Patient: Gabriel [name]. FMW:[id number] DOB: 1970/12/01 DOA: 07/06/2024     4 DOS: the patient was seen and examined on 07/11/2024   Brief hospital course: 53 y.o. male with medical history significant of chronic combined HFrEF and HFpEF, pulmonary hypertension, COPD, nonobstructive CAD, status post ICD, PE on Xarelto , OSA on CPAP, morbid obesity, HTN, IDDM, cirrhosis secondary to NASH, CKD stage IIIa, presented with increasing shortness of breath and abdominal pain.   Symptoms started 4-5 days ago, patient started to notice intermittent  stretching like abdominal pain, worsening with torso movement.  He also noticed increasing girth of his abdomen and he gained more than 10 pounds in last 7 days.  He tried to increase his torsemide  dosage to twice daily with no significant improvement.  Yesterday he started develop wheezing and dry cough and increasing shortness of breath and could not lie flat last night because of shortness of breath.  Denies any chest pain no fever or chills.  No ankle edema.   ED Course: Afebrile, borderline tachycardia blood pressure elevated 170/97 95% on room air.  CT chest abdomen showed no acute findings, blood work showed BUN 18 creatinine 1.3 glucose 220, bicarb 19, WBC 9.2 hemoglobin 15.8.  11/19.  Patient states he was 230 pounds back in January.  Yesterday's weight was 270 pounds.  Patient feels a little bit better today but short of breath.  Cardiology increased Lasix  to 3 times daily dosing 11/20.  Creatinine up to 2.14 hold Lasix  today. 11/21.  Creatinine still elevated at 1.94.  Holding Lasix  and Entresto  currently.  Patient's weight 255.29 pounds.  With lactic acid elevated and we started milrinone  drip and PICC line placed. 11/22.  Patient feeling little bit better.  Weight 255.1. 11/23.  Patient had some pain in his right upper quadrant last night.  Reviewed sonogram done on admission that did not show any acute cholecystitis.  Continue  milrinone  drip.  Weight 253.53.  Assessment and Plan: * Cardiogenic shock (HCC) Patient started on milrinone  yesterday afternoon with elevated lactic acid.  Lactic acid has improved.  Continue milrinone  drip.  Lasix  120 mg IV today.  Last EF 35 to 40%.  Acute on chronic systolic CHF (congestive heart failure) [facility]) Cardiology increase Lasix  to 80 mg IV every 8 hours on 11/19.  With creatinine rise held Lasix  on 11/20.  Lasix  IV daily with milrinone  drip.  Holding Entresto  today.  Holding spironolactone .  Holding low-dose Coreg  (while on milrinone ).  Patient on Farxiga .  Last EF 35 to 40%  Abdominal swelling Holding spironolactone  Last night and this morning a little pain in the right upper quadrant.  Reviewed sonogram from admission which did not show any evidence of gallstones or pericholecystic fluid.  Continue to monitor.  Acute kidney injury superimposed on CKD AKI on CKD stage IIIa.  Creatinine rose to 2.14 on 11/20.  Today's creatinine 1.97.  Creatinine 1.34 on presentation.  Monitor closely on milrinone  drip and Lasix .  PAF (paroxysmal atrial fibrillation) (HCC) On amiodarone  and Xarelto   Type 2 diabetes mellitus with hyperlipidemia (HCC) With hyperglycemia also.  Patient on Crestor  and will increase to 36 units of Semglee  insulin  and 10 units of NovoLog  3 times daily  Hypokalemia Potassium supplementation  NASH (nonalcoholic steatohepatitis) Outpatient follow-up.  Obesity (BMI 30-39.9) BMI 35.36, class II obesity.  History of pulmonary embolism On Xarelto   Polycythemia vera (HCC) Follow-up as outpatient  COPD (chronic obstructive pulmonary disease) (HCC) Respiratory status stable.  Subjective: Patient feeling better than when he came in.  Admitted with CHF exacerbation.  Physical Exam: Vitals:   07/11/24 0004 07/11/24 0500 07/11/24 0814 07/11/24 1200  BP: (!) 140/85  (!) 163/87 (!) 147/80  Pulse: 95  89 87  Resp: 20  20 18   Temp: 98.2 F (36.8 C)   (!) 97.4 F (36.3 C) 97.7 F (36.5 C)  TempSrc:      SpO2: 96%  94% 93%  Weight:  115 kg    Height:       Physical Exam HENT:     Head: Normocephalic.     Mouth/Throat:     Pharynx: No oropharyngeal exudate.  Eyes:     General: Lids are normal.     Conjunctiva/sclera: Conjunctivae normal.  Cardiovascular:     Rate and Rhythm: Normal rate and regular rhythm.     Heart sounds: Normal heart sounds, S1 normal and S2 normal.  Pulmonary:     Breath sounds: Examination of the right-lower field reveals decreased breath sounds. Examination of the left-lower field reveals decreased breath sounds. Decreased breath sounds present. No wheezing, rhonchi or rales.  Abdominal:     General: There is distension.     Palpations: Abdomen is soft.     Tenderness: There is abdominal tenderness in the right upper quadrant.  Musculoskeletal:     Right lower leg: No swelling.     Left lower leg: No swelling.  Skin:    General: Skin is warm.     Findings: No rash.  Neurological:     Mental Status: He is alert and oriented to person, place, and time.     Data Reviewed: Creatinine 1.97, chloride 91, potassium 3.7, magnesium  2.4, GFR 39 Reviewed right upper quadrant ultrasound from 11/18 showed no acute findings, moderate hepatic steatosis, no gallstones or gallbladder wall thickening.   Disposition: Status is: Inpatient Remains inpatient appropriate because: Continue milrinone  drip  Planned Discharge Destination: Home    Time spent: 28 minutes  Author: Charlie Patterson, MD 07/11/2024 12:09 PM  For on call review [email or web address].

## 2024-07-11 NOTE — Progress Notes (Signed)
 Rounding Note   Patient Name: Gabriel [name]. Date of Encounter: 07/11/2024  [facility] Cardiologist: Timothy Gollan, MD   Subjective No notable events overnight.  Significant urine output, -4L negative.  253 pounds today.  He notes he is feeling better since admission.  Interesting discussion regarding his weights today; he notes that he felt like 1 million bucks when he was at 230 pounds which was back sometime around June.  Chart review shows that h his weights have gradually been trending up since that time.  Creatinine and BUN essentially similar to yesterday.  Scheduled Meds:  allopurinol   300 mg Oral Daily   amiodarone   200 mg Oral Daily   aspirin  EC  81 mg Oral Daily   Chlorhexidine  Gluconate Cloth  6 each Topical Daily   dapagliflozin  propanediol  10 mg Oral Daily   ezetimibe   10 mg Oral Daily   insulin  aspart  0-20 Units Subcutaneous TID WC   insulin  aspart  0-5 Units Subcutaneous QHS   insulin  aspart  10 Units Subcutaneous TID WC   insulin  glargine-yfgn  34 Units Subcutaneous Daily   ipratropium  2 spray Each Nare Q8H   pantoprazole   40 mg Oral Daily   rivaroxaban   20 mg Oral Q supper   rosuvastatin   20 mg Oral Daily   sodium chloride  flush  10-40 mL Intracatheter Q12H   sodium chloride  flush  3 mL Intravenous Q12H   Continuous Infusions:  milrinone  0.125 mcg/kg/min (07/10/24 1847)   PRN Meds: acetaminophen , hydrALAZINE , ipratropium-albuterol , meclizine , ondansetron  (ZOFRAN ) IV, sodium chloride  flush, sodium chloride  flush   Vital Signs  Vitals:   07/10/24 1628 07/10/24 1945 07/11/24 0004 07/11/24 0500  BP: 138/78 (!) 148/78 (!) 140/85   Pulse: 88 88 95   Resp: 18 20 20    Temp: 98.2 F (36.8 C) 98.1 F (36.7 C) 98.2 F (36.8 C)   TempSrc:      SpO2: 96% 96% 96%   Weight:    115 kg  Height:        Intake/Output Summary (Last 24 hours) at 07/11/2024 0703 Last data filed at 07/11/2024 0656 Gross per 24 hour  Intake 1370.35 ml  Output  5825 ml  Net -4454.65 ml      07/11/2024    5:00 AM 07/10/2024   10:15 AM 07/10/2024    5:20 AM  Last 3 Weights  Weight (lbs) 253 lb 8.5 oz 255 lb 1.6 oz 253 lb 8.5 oz  Weight (kg) 115 kg 115.713 kg 115 kg      Telemetry Occasional brief salvos of PAT, PVCs, NSVT  ECG   - No new tracings  Physical Exam  GEN: No acute distress.   Neck: Unable to assess JVD given body habitus Cardiac: RRR, no murmurs, rubs, or gallops.  Respiratory: Clear to auscultation bilaterally. GI: nontender, distended but soft MS: No edema; No deformity. Neuro:  Nonfocal  Psych: Normal affect   Labs High Sensitivity Troponin:  No results for input(s): TROPONINIHS in the last 720 hours.   Chemistry Recent Labs  Lab 07/06/24 0525 07/07/24 0547 07/08/24 0432 07/09/24 0748 07/09/24 1343 07/10/24 0620 07/10/24 1537  NA 138   < > 139 134*  --  135 133*  K 4.0   < > 3.7 3.4*  --  3.4* 3.7  CL 104   < > 94* 94*  --  92* 89*  CO2 19*   < > 28 24  --  29 29  GLUCOSE 226*   < >  182* 193*  --  208* 255*  BUN 18   < > 51* 64*  --  69* 61*  CREATININE 1.34*   < > 2.14* 1.94*  --  1.98* 1.98*  CALCIUM  9.7   < > 10.0 9.9  --  9.8 9.5  MG  --   --  2.3 2.4  --  2.3  --   PROT 7.5  --   --   --  7.7  --   --   ALBUMIN  4.2  --   --   --  4.2  --   --   AST 22  --   --   --  19  --   --   ALT 21  --   --   --  15  --   --   ALKPHOS 62  --   --   --  72  --   --   BILITOT 0.8  --   --   --  1.0  --   --   GFRNONAA >60   < > 36* 40*  --  39* 39*  ANIONGAP 14   < > 16* 16*  --  14 15   < > = values in this interval not displayed.    Lipids No results for input(s): CHOL, TRIG, HDL, LABVLDL, LDLCALC, CHOLHDL in the last 168 hours.  Hematology Recent Labs  Lab 07/06/24 0525  WBC 9.3  RBC 5.71  HGB 15.8  HCT 48.9  MCV 85.6  MCH 27.7  MCHC 32.3  RDW 13.2  PLT 169   Thyroid  No results for input(s): TSH, FREET4 in the last 168 hours.  BNP Recent Labs  Lab 07/06/24 0613  07/09/24 0748  PROBNP 2,488.0* 143.0    DDimer No results for input(s): DDIMER in the last 168 hours.   Radiology  US  EKG SITE RITE Result Date: 07/09/2024 If Site Rite image not attached, placement could not be confirmed due to current cardiac rhythm.   Cardiac Studies -RHC 06/23/2024 RA 3, mean PA 24, PCWP 22, Fick/TD 1.8/2.0, PAP 6.3 -Monitor 03/2024 mean heart rate 78 bpm, 11 episodes of SVT (longest 8 beats), 7 episodes of NSVT (longest 7 beats), rare ectopy, no atrial fibrillation - TTE 12/2023 LVEF 35 to 40%, low normal RV function, mild to moderate AI, aortic root dilation to 40 mm -TTE 10/2023 EF 35 to 40% - Cath 07/2023 CTO proximal RCA, moderate LAD and LCx disease - TTE 02/2023 LVEF 45 to 50%, mild AI  Patient Profile   Gabriel [name]. is a 53 y.o. male with a hx of ischemic and nonischemic cardiomyopathy status post subcutaneous ICD, frequent hospital admissions for heart failure exacerbation, CAD with known CTO RCA and moderate LAD/LCx disease, COPD/long smoking history, paroxysmal atrial fibrillation, presenting with acute decompensated heart failure.  A/P: Acute decompensated heart failure Heart failure with reduced ejection fraction Mixed ischemic and nonischemic cardiomyopathy Cardiogenic shock Lactic acid has cleared and renal function has improved following initiation of milrinone  with aggressive diuresis.  Difficult volume exam.  Suspect dry weight is somewhere in the 245 pound range based on historical documentation, though perhaps even lower since he was 235 to 240 pounds back in June and reports he was feeling very good at that time.  Plan to continue inotrope-assisted diuresis until azotemia ensues. 253 lbs today.  Plan: - Continue milrinone  0.125 - Will diurese again today; will start with Lasix  120 mg once and likely redose in the  p.m.  - Twice daily BMPs while diuresing - Hold GDMT while giving inotropes; dapagliflozin  okay to continue - Since he seems  to be responding, we will plan to continue diuresing until azotemia given difficult volume exam; we should do a right heart cath at that time to help with weaning milrinone  and diuretic guidance. Xarelto  will need to be held for 24 hours.   Paroxysmal atrial fibrillation Ablation deferred by EP on office visit October 2025 Maintaining normal sinus rhythm Continue Xarelto , amiodarone , Coreg     Coronary artery disease with stable angina Cardiac catheterization August 19, 2023, stable  compared to prior study 2021 -Continue Zetia , Crestor  20, Xarelto  in place of aspirin  -Denies chest pain concerning for angina   For questions or updates, please contact [facility] Please consult [email or web address] for contact info under     Signed, Caron Poser, MD  07/11/2024, 7:03 AM

## 2024-07-12 DIAGNOSIS — I5023 Acute on chronic systolic (congestive) heart failure: Secondary | ICD-10-CM | POA: Diagnosis not present

## 2024-07-12 DIAGNOSIS — R1900 Intra-abdominal and pelvic swelling, mass and lump, unspecified site: Secondary | ICD-10-CM | POA: Diagnosis not present

## 2024-07-12 DIAGNOSIS — N179 Acute kidney failure, unspecified: Secondary | ICD-10-CM | POA: Diagnosis not present

## 2024-07-12 DIAGNOSIS — R570 Cardiogenic shock: Secondary | ICD-10-CM | POA: Diagnosis not present

## 2024-07-12 LAB — BASIC METABOLIC PANEL WITH GFR
Anion gap: 13 (ref 5–15)
BUN: 64 mg/dL — ABNORMAL HIGH (ref 6–20)
CO2: 29 mmol/L (ref 22–32)
Calcium: 9.7 mg/dL (ref 8.9–10.3)
Chloride: 91 mmol/L — ABNORMAL LOW (ref 98–111)
Creatinine, Ser: 2.18 mg/dL — ABNORMAL HIGH (ref 0.61–1.24)
GFR, Estimated: 35 mL/min — ABNORMAL LOW (ref >60)
Glucose, Bld: 236 mg/dL — ABNORMAL HIGH (ref 70–99)
Potassium: 3.6 mmol/L (ref 3.5–5.1)
Sodium: 134 mmol/L — ABNORMAL LOW (ref 135–145)

## 2024-07-12 LAB — GLUCOSE, CAPILLARY
Glucose-Capillary: 265 mg/dL — ABNORMAL HIGH (ref 70–99)
Glucose-Capillary: 310 mg/dL — ABNORMAL HIGH (ref 70–99)
Glucose-Capillary: 202 mg/dL — ABNORMAL HIGH (ref 70–99)
Glucose-Capillary: 160 mg/dL — ABNORMAL HIGH (ref 70–99)

## 2024-07-12 LAB — MAGNESIUM: Magnesium: 2.4 mg/dL (ref 1.7–2.4)

## 2024-07-12 LAB — COOXEMETRY PANEL
Carboxyhemoglobin: 1.6 % — ABNORMAL HIGH (ref 0.5–1.5)
O2 Saturation: 66.9 %
Total hemoglobin: 17.2 g/dL — ABNORMAL HIGH (ref 12.0–16.0)
Total oxygen content: 65.4 %

## 2024-07-12 MED ORDER — RIVAROXABAN 20 MG PO TABS: 20.0000 mg | ORAL_TABLET | Freq: Every day | ORAL | Status: DC

## 2024-07-12 MED ORDER — HYDRALAZINE HCL 25 MG PO TABS
25.0000 mg | ORAL_TABLET | Freq: Three times a day (TID) | ORAL | Status: DC
  Administered 2024-07-12 – 2024-07-14 (×5): 25 mg via ORAL
  Filled 2024-07-12 (×5): qty 1

## 2024-07-12 NOTE — Progress Notes (Signed)
 Heart Failure Stewardship Pharmacy Note  PCP: Franchot Isaiah LABOR, MD PCP-Cardiologist: [name], MD  HPI: Gabriel Laur. is a 53 y.o. male with atrial fibrillation, HTN, T2DM, COPD, CKD, asthma, NSVT, OSA, unprovoked PE, pulmonary hypertension, psoriasis, migraine, NASH, ICD placement 05/2022, and CHF who presented with upper abdominal pain, abdominal distention, and shortness of breath. On admission, proBNP was 2488, CO2 was 19, HS-troponin was 42, LFTs WNL, and lipase was 61. Chest x-ray noted no acute findings.   Pertinent cardiac history: RHC/LHC in 05/2020 noted CTO of proximal RCA filled via elft to right collaterals, moderate PH (PVR 7.4 WU), and severely reduced cardiac output/index. TTE 05/2021 with LVEF 40-45%. ICD placed 05/2022. TTE 07/2022 EF 40-45% with mild LVH, G1DD, severe hypokinesis of left ventricular, entire inferior and inferolateral wall. TTE 03/08/23 EF 45-50% with moderate LVH, trivial MR, mild dilatation of aortic root at 43 mm. TTE 10/2023 with LVEF 35-40%, global LV hypokinesis, severely dilated LV internal cavity size, mild LVH, small pericardial effusion, mild MR. TTE 12/2023 with LVEF 35-40%, mild LVH, low normal RV, mild LAE, mild/ moderate AR, mild dilatation of the aortic root, measuring 40 mm. RHC 06/2024 noted RA of 3, PA 34/15 (24), CO 4.3 + CI 1.8 via fick, CO 4.8 + CI 2 via thermo, PCWP 22, PAPi 6.3.   Pertinent Lab Values: Creatinine  Date Value Ref Range Status  09/29/2023 1.20 0.61 - 1.24 mg/dL Final  97/80/7984 9.22 0.60 - 1.30 mg/dL Final   Creatinine, Ser  Date Value Ref Range Status  07/12/2024 2.18 (H) 0.61 - 1.24 mg/dL Final   BUN  Date Value Ref Range Status  07/12/2024 64 (H) 6 - 20 mg/dL Final  91/80/7974 43 (H) 6 - 24 mg/dL Final  97/80/7984 10 7 - 18 mg/dL Final   Potassium  Date Value Ref Range Status  07/12/2024 3.6 3.5 - 5.1 mmol/L Final  10/07/2013 3.8 3.5 - 5.1 mmol/L Final   Sodium  Date Value Ref Range Status   07/12/2024 134 (L) 135 - 145 mmol/L Final  04/06/2024 137 134 - 144 mmol/L Final  10/07/2013 139 136 - 145 mmol/L Final   B Natriuretic Peptide  Date Value Ref Range Status  06/28/2024 143.3 (H) 0.0 - 100.0 pg/mL Final    Comment:    Performed at [facility], [address] [place], [place] [address]   Magnesium   Date Value Ref Range Status  07/12/2024 2.4 1.7 - 2.4 mg/dL Final    Comment:    Performed at [facility], [address] [place], [place] [address]   Hemoglobin A1C  Date Value Ref Range Status  04/02/2024 8.8 (A) 4.0 - 5.6 % Final   Hgb A1c MFr Bld  Date Value Ref Range Status  01/01/2024 6.9 (H) 4.8 - 5.6 % Final    Comment:             Prediabetes: 5.7 - 6.4          Diabetes: >6.4          Glycemic control for adults with diabetes: <7.0    TSH  Date Value Ref Range Status  04/06/2024 1.600 0.450 - 4.500 uIU/mL Final    Vital Signs:  Temp:  [97.7 F (36.5 C)-98.2 F (36.8 C)] 98.2 F (36.8 C) (11/24 0356) Pulse Rate:  [87-97] 97 (11/24 0356) Cardiac Rhythm: Normal sinus rhythm;Bundle branch block (11/23 1900) Resp:  [18-20] 20 (11/24 0356) BP: (129-150)/(72-96) 129/72 (11/24 0356) SpO2:  [92 %-  97 %] 92 % (11/24 0356) FiO2 (%):  [21 %] 21 % (11/23 2200) Weight:  [115.4 kg (254 lb 6.6 oz)] 115.4 kg (254 lb 6.6 oz) (11/24 0500)  Intake/Output Summary (Last 24 hours) at 07/12/2024 0837 Last data filed at 07/12/2024 0700 Gross per 24 hour  Intake 634.99 ml  Output 4850 ml  Net -4215.01 ml   Current Heart Failure Medications:  Loop diuretic: none Beta-Blocker: none ACEI/ARB/ARNI: none MRA: spironolactone  25 mg daily SGLT2i: Farxiga  10 mg daily Other: milrinone  0.125 mcg/kg/min  Prior to admission Heart Failure Medications:  Loop diuretic: torsemide  80 mg daily Beta-Blocker: carvedilol  3.125 mg BID ACEI/ARB/ARNI: Entresto  49-51 mg BID MRA: spironolactone  25 mg daily SGLT2i: Farxiga  10 mg daily Other:  none  Assessment: 1. Acute on chronic systolic heart failure (LVEF 35-40%) with known low cardiac output, due to NICM. NYHA class III symptoms.  -Symptoms: Reports symptoms are much improved. Denies orthopnea now. Shortness of breath and abdominal distention are back to baseline. Appetite is good. Reports dry weight was ~230 lbs several months ago, though his new dry weight may be closer to 245-250 lbs. No significant LEE. -Volume: Appears relatively euvolemic, though difficult to assess given body habitus and abdominal reservoir. Creatinine and BUN remain elevated but stable. Furosemide  held today. Would consider checking REDS again today to guide further diuresis. -Hemodynamics: Lactic acid is cleared. Symptoms of low output improved on milrinone . No COOX has been drawn thus far despite having PICC line. Currently on milrinone  0.125 mcg/kg/min, would check COOX to determine eligibility for transitioning off inotrope. -BB: Continue holding carvedilol  while on milrinone . -ACEI/ARB/ARNI: Entresto  held given lower BP and AKI. Can consider resuming when AKI resolved, especially given high BP currently. -MRA: Home spironolactone   held for AKI. -SGLT2i: Continue home Farxiga  10 mg daily  Plan: 1) Medication changes recommended at this time: - Daily COOX ordered but not drawn. Messaged RN. - Can adjust diuretics pending REDS. - Would benefit from afterload reduction. Can consider hydralazine  25 mg TID while awaiting appropriate renal function for restarting Entresto .  2) Patient assistance: -Pending  3) Education: - Patient has been educated on current HF medications and potential additions to HF medication regimen - Patient verbalizes understanding that over the next few months, these medication doses may change and more medications may be added to optimize HF regimen - Patient has been educated on basic disease state pathophysiology and goals of therapy  Please do not hesitate to reach out with  questions or concerns,  Gabriel [name], PharmD, CPP, BCPS, [facility] Heart Failure Pharmacist  Phone - [phone] 07/12/2024 8:37 AM

## 2024-07-12 NOTE — Progress Notes (Signed)
 Progress Note   Patient: Gabriel [name]. FMW:[id number] DOB: [birth year] DOA: 07/06/2024     5 DOS: the patient was seen and examined on 07/12/2024   Brief hospital course: 53 y.o. male with medical history significant of chronic combined HFrEF and HFpEF, pulmonary hypertension, COPD, nonobstructive CAD, status post ICD, PE on Xarelto , OSA on CPAP, morbid obesity, HTN, IDDM, cirrhosis secondary to NASH, CKD stage IIIa, presented with increasing shortness of breath and abdominal pain.   Symptoms started 4-5 days ago, patient started to notice intermittent  stretching like abdominal pain, worsening with torso movement.  He also noticed increasing girth of his abdomen and he gained more than 10 pounds in last 7 days.  He tried to increase his torsemide  dosage to twice daily with no significant improvement.  Yesterday he started develop wheezing and dry cough and increasing shortness of breath and could not lie flat last night because of shortness of breath.  Denies any chest pain no fever or chills.  No ankle edema.   ED Course: Afebrile, borderline tachycardia blood pressure elevated 170/97 95% on room air.  CT chest abdomen showed no acute findings, blood work showed BUN 18 creatinine 1.3 glucose 220, bicarb 19, WBC 9.2 hemoglobin 15.8.  11/19.  Patient states he was 230 pounds back in January.  Yesterday's weight was 270 pounds.  Patient feels a little bit better today but short of breath.  Cardiology increased Lasix  to 3 times daily dosing 11/20.  Creatinine up to 2.14 hold Lasix  today. 11/21.  Creatinine still elevated at 1.94.  Holding Lasix  and Entresto  currently.  Patient's weight 255.29 pounds.  With lactic acid elevated and we started milrinone  drip and PICC line placed. 11/22.  Patient feeling little bit better.  Weight 255.1. 11/23.  Patient had some pain in his right upper quadrant last night.  Reviewed sonogram done on admission that did not show any acute cholecystitis.  Continue  milrinone  drip.  Weight 253.53. 11/24.  Creatinine up at 2.18, weight 254.41.  Assessment and Plan: * Cardiogenic shock (HCC) Patient started on milrinone  on 11/21.  Continue milrinone  drip.  Holding Lasix  today with creatinine elevation.  Last EF 35 to 40%.  Acute on chronic systolic CHF (congestive heart failure) (HCC) Continue milrinone  drip.  Lasix  held today with creatinine elevation.  Holding Entresto  today.  Holding spironolactone .  Holding low-dose Coreg  (while on milrinone ).  Patient on Farxiga .  Last EF 35 to 40%.  Cardiology added hydralazine  today.  Abdominal swelling Holding spironolactone  No further abdominal pain  Acute kidney injury superimposed on CKD AKI on CKD stage IIIa.  Creatinine rose to 2.14 on 11/20.  Creatinine rose to 2.18 on 11/24.  Creatinine 1.34 on presentation.  Monitor closely on milrinone .  Lasix  on hold today.  PAF (paroxysmal atrial fibrillation) (HCC) On amiodarone  and Xarelto  (will hold for right heart cath)  Type 2 diabetes mellitus with hyperlipidemia (HCC) With hyperglycemia also.  Patient on Crestor  and will continue 36 units of Semglee  insulin  and 10 units of NovoLog  3 times daily  Hypokalemia Supplemented  NASH (nonalcoholic steatohepatitis) Outpatient follow-up.  Obesity (BMI 30-39.9) BMI 35.36, class II obesity.  History of pulmonary embolism On Xarelto  (hold for right heart cath)  Polycythemia vera (HCC) Follow-up as outpatient  COPD (chronic obstructive pulmonary disease) (HCC) Respiratory status stable.        Subjective: Patient feeling better than when he came in.  Admitted with CHF exacerbation.  Physical Exam: Vitals:   07/12/24 0500 07/12/24 9160  07/12/24 1207 07/12/24 1546  BP:  (!) 158/87 136/82 123/69  Pulse:  95 89 88  Resp:  16 16 17   Temp:  98.6 F (37 C) 98.1 F (36.7 C) 97.6 F (36.4 C)  TempSrc:      SpO2:  94% 96% 97%  Weight: 115.4 kg     Height:       Physical Exam HENT:     Head:  Normocephalic.     Mouth/Throat:     Pharynx: No oropharyngeal exudate.  Eyes:     General: Lids are normal.     Conjunctiva/sclera: Conjunctivae normal.  Cardiovascular:     Rate and Rhythm: Normal rate and regular rhythm.     Heart sounds: Normal heart sounds, S1 normal and S2 normal.  Pulmonary:     Breath sounds: No decreased breath sounds, wheezing, rhonchi or rales.  Abdominal:     Palpations: Abdomen is soft.     Tenderness: There is no abdominal tenderness.  Musculoskeletal:     Right lower leg: No swelling.     Left lower leg: No swelling.  Skin:    General: Skin is warm.     Findings: No rash.  Neurological:     Mental Status: He is alert and oriented to person, place, and time.     Data Reviewed: Creatinine 134, chloride 91, BUN 64, creatinine 2.18, GFR 35  Disposition: Status is: Inpatient Remains inpatient appropriate because: Patient on milrinone  drip.  Cardiology plans on doing a right heart cath tomorrow  Planned Discharge Destination: Home    Time spent: 28 minutes  Author: Charlie Patterson, MD 07/12/2024 4:50 PM  For on call review [email or web address].

## 2024-07-12 NOTE — Progress Notes (Signed)
 ReDS Vest / Clip - 07/12/24 1200       ReDS Vest / Clip   Station Marker D    Ruler Value 46    ReDS Value Range High volume overload    ReDS Actual Value 43         Charmaine Pines, RN, BSN [facility] Heart Failure Navigator Secure Chat Only

## 2024-07-12 NOTE — TOC Progression Note (Signed)
 Transition of Care (TOC) - Progression Note    Patient Details  Name: Gabriel [name]. MRN: [id number] Date of Birth: 03/20/1971  Transition of Care [facility]) CM/SW Contact  Lauraine JAYSON Carpen, LCSW Phone Number: 07/12/2024, 12:05 PM  Clinical Narrative:  CSW continues to follow progress.   Expected Discharge Plan: Home/Self Care Barriers to Discharge: Continued Medical Work up               Expected Discharge Plan and Services     Post Acute Care Choice: NA Living arrangements for the past 2 months: Single Family Home                                       Social Drivers of Health (SDOH) Interventions SDOH Screenings   Food Insecurity: No Food Insecurity (07/07/2024)  Housing: Unknown (07/07/2024)  Transportation Needs: No Transportation Needs (07/07/2024)  Utilities: Not At Risk (07/07/2024)  Alcohol Screen: Low Risk  (09/17/2022)  Depression (PHQ2-9): Low Risk  (06/17/2024)  Recent Concern: Depression (PHQ2-9) - Medium Risk (04/02/2024)  Financial Resource Strain: Medium Risk (05/06/2024)   Received from [facility]  Physical Activity: Insufficiently Active (01/20/2018)  Social Connections: Unknown (10/22/2023)  Stress: Stress Concern Present (09/02/2017)  Tobacco Use: Medium Risk (07/06/2024)  Health Literacy: Adequate Health Literacy (04/02/2024)    Readmission Risk Interventions    07/08/2024    4:21 PM 11/04/2023    8:19 PM  Readmission Risk Prevention Plan  Transportation Screening Complete Complete  PCP or Specialist Appt within 5-7 Days Complete   Medication Review (RN CM) Complete   Medication Review (RN Care Manager)  Complete  PCP or Specialist appointment within 3-5 days of discharge  Complete  HRI or Home Care Consult  Complete  SW Recovery Care/Counseling Consult  Complete  Palliative Care Screening  Not Applicable  Skilled Nursing Facility  Not Applicable

## 2024-07-12 NOTE — Progress Notes (Signed)
 Rounding Note   Patient Name: Gabriel [name]. Date of Encounter: 07/12/2024  [facility] Cardiologist: [name], MD  Subjective Patient reports overall improvements in dyspnea on exertion and abdominal swelling.  He has good UOP with net -15+ liters since admission.  However, weight is inconsistent with UOP.  Kidney function is trending up today.  Would likely benefit from RHC tomorrow.  Scheduled Meds:  allopurinol   300 mg Oral Daily   amiodarone   200 mg Oral Daily   aspirin  EC  81 mg Oral Daily   Chlorhexidine  Gluconate Cloth  6 each Topical Daily   dapagliflozin  propanediol  10 mg Oral Daily   ezetimibe   10 mg Oral Daily   insulin  aspart  0-20 Units Subcutaneous TID WC   insulin  aspart  0-5 Units Subcutaneous QHS   insulin  aspart  10 Units Subcutaneous TID WC   insulin  glargine-yfgn  36 Units Subcutaneous Daily   ipratropium  2 spray Each Nare Q8H   pantoprazole   40 mg Oral Daily   rivaroxaban   20 mg Oral Q supper   rosuvastatin   20 mg Oral Daily   sodium chloride  flush  10-40 mL Intracatheter Q12H   sodium chloride  flush  3 mL Intravenous Q12H   Continuous Infusions:  milrinone  0.125 mcg/kg/min (07/12/24 0700)   PRN Meds: acetaminophen , hydrALAZINE , ipratropium-albuterol , meclizine , ondansetron  (ZOFRAN ) IV, sodium chloride  flush, sodium chloride  flush   Vital Signs  Vitals:   07/12/24 0034 07/12/24 0356 07/12/24 0500 07/12/24 0839  BP: (!) 144/86 129/72  (!) 158/87  Pulse: 97 97  95  Resp: 20 20  16   Temp: 98.1 F (36.7 C) 98.2 F (36.8 C)  98.6 F (37 C)  TempSrc:      SpO2: 95% 92%  94%  Weight:   115.4 kg   Height:        Intake/Output Summary (Last 24 hours) at 07/12/2024 1112 Last data filed at 07/12/2024 1023 Gross per 24 hour  Intake 874.99 ml  Output 4725 ml  Net -3850.01 ml      07/12/2024    5:00 AM 07/11/2024    5:00 AM 07/10/2024   10:15 AM  Last 3 Weights  Weight (lbs) 254 lb 6.6 oz 253 lb 8.5 oz 255 lb 1.6 oz   Weight (kg) 115.4 kg 115 kg 115.713 kg      Telemetry Sinus rhythm - Personally Reviewed  Physical Exam  GEN: No acute distress.   Neck: Difficult to assess JVD due to facial hair Cardiac: RRR, no murmurs, rubs, or gallops.  Respiratory: Clear to auscultation bilaterally. GI: Soft, nontender, non-distended  MS: No edema; No deformity. Neuro:  Nonfocal  Psych: Normal affect   Labs High Sensitivity Troponin:  No results for input(s): TROPONINIHS in the last 720 hours.   Chemistry Recent Labs  Lab 07/06/24 0525 07/07/24 0547 07/09/24 1343 07/10/24 0620 07/10/24 1537 07/11/24 0645 07/11/24 1500 07/12/24 0555  NA 138   < >  --  135   < > 135 133* 134*  K 4.0   < >  --  3.4*   < > 3.7 3.7 3.6  CL 104   < >  --  92*   < > 91* 89* 91*  CO2 19*   < >  --  29   < > 29 29 29   GLUCOSE 226*   < >  --  208*   < > 221* 311* 236*  BUN 18   < >  --  69*   < > 67* 56* 64*  CREATININE 1.34*   < >  --  1.98*   < > 1.97* 2.02* 2.18*  CALCIUM  9.7   < >  --  9.8   < > 9.6 9.3 9.7  MG  --    < >  --  2.3  --  2.4  --  2.4  PROT 7.5  --  7.7  --   --   --   --   --   ALBUMIN  4.2  --  4.2  --   --   --   --   --   AST 22  --  19  --   --   --   --   --   ALT 21  --  15  --   --   --   --   --   ALKPHOS 62  --  72  --   --   --   --   --   BILITOT 0.8  --  1.0  --   --   --   --   --   GFRNONAA >60   < >  --  39*   < > 39* 38* 35*  ANIONGAP 14   < >  --  14   < > 14 15 13    < > = values in this interval not displayed.    Lipids No results for input(s): CHOL, TRIG, HDL, LABVLDL, LDLCALC, CHOLHDL in the last 168 hours.  Hematology Recent Labs  Lab 07/06/24 0525  WBC 9.3  RBC 5.71  HGB 15.8  HCT 48.9  MCV 85.6  MCH 27.7  MCHC 32.3  RDW 13.2  PLT 169   Thyroid  No results for input(s): TSH, FREET4 in the last 168 hours.  BNP Recent Labs  Lab 07/06/24 0613 07/09/24 0748  PROBNP 2,488.0* 143.0    DDimer No results for input(s): DDIMER in the last 168 hours.    Radiology  No results found.  Cardiac Studies  06/23/2024 RHC Mildly elevated filling pressures with moderate to severely reduced CO   12/2023 Echo complete 1. Left ventricular ejection fraction, by estimation, is 35 to 40%. Left  ventricular ejection fraction by 2D MOD biplane is 35.0 %. Left  ventricular ejection fraction by PLAX is 36 %. The left ventricle has  moderately decreased function. The left  ventricle demonstrates global hypokinesis. There is mild left ventricular  hypertrophy. Left ventricular diastolic function could not be evaluated.   2. Right ventricular systolic function is low normal. The right  ventricular size is normal.   3. Left atrial size was mildly dilated.   4. The mitral valve is normal in structure. No evidence of mitral valve  regurgitation.   5. The aortic valve is tricuspid. Aortic valve regurgitation is mild to  moderate.   6. Aortic dilatation noted. There is mild dilatation of the aortic root,  measuring 40 mm.   7. The inferior vena cava is normal in size with greater than 50%  respiratory variability, suggesting right atrial pressure of 3 mmHg.   Patient Profile   53 y.o. male with a hx of ischemic and nonischemic cardiomyopathy status post subcutaneous ICD, frequent hospital admissions for heart failure exacerbation, CAD with known CTO RCA and moderate LAD/LCx disease, COPD/long smoking history, paroxysmal atrial fibrillation, presenting with acute decompensated heart failure.   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  RHC 06/23/2024 with mildly elevated filling pressures and moderate to severely reduced CO. - Has been IV diuresed during admission with net UOP -16 L - Volume status is difficult to assess due to body habitus.  Dry weight appears to be around 250 pounds, although patient reports weight around 235-240 in June feeling very well  at that time.  Weight today is up from yesterday at 254 - Continued on milrinone  and dapagliflozin  - BP trending up.  Will start hydralazine  25 mg 3 times daily for afterload reduction.  Can plan to transition back to Entresto  when renal function improves. - BUN/creatinine uptrending today.  Lasix  held. Will defer further diuresis to MD. - Given worsening renal function, will plan for RHC tomorrow.  Hold Xarelto  tonight  Paroxysmal atrial fibrillation - Evaluated by EP 05/2024 and an ablation was deferred - Maintaining sinus rhythm - Continue amiodarone  and carvedilol .  Holding Xarelto  tonight in preparation for RHC tomorrow.  Coronary artery disease - Catheterization 07/2023 showed stable disease compared to prior study in 2021 - No chest pain - Continue Zetia , Crestor , and Xarelto  in place of aspirin    For questions or updates, please contact [facility] Please consult [email or web address] for contact info under       Signed, Lesley LITTIE Maffucci, PA-C  07/12/2024, 11:12 AM

## 2024-07-12 NOTE — H&P (View-Only) (Signed)
 Rounding Note   Patient Name: Gabriel [name]. Date of Encounter: 07/12/2024  [facility] Cardiologist: [name], MD  Subjective Patient reports overall improvements in dyspnea on exertion and abdominal swelling.  He has good UOP with net -15+ liters since admission.  However, weight is inconsistent with UOP.  Kidney function is trending up today.  Would likely benefit from RHC tomorrow.  Scheduled Meds:  allopurinol   300 mg Oral Daily   amiodarone   200 mg Oral Daily   aspirin  EC  81 mg Oral Daily   Chlorhexidine  Gluconate Cloth  6 each Topical Daily   dapagliflozin  propanediol  10 mg Oral Daily   ezetimibe   10 mg Oral Daily   insulin  aspart  0-20 Units Subcutaneous TID WC   insulin  aspart  0-5 Units Subcutaneous QHS   insulin  aspart  10 Units Subcutaneous TID WC   insulin  glargine-yfgn  36 Units Subcutaneous Daily   ipratropium  2 spray Each Nare Q8H   pantoprazole   40 mg Oral Daily   rivaroxaban   20 mg Oral Q supper   rosuvastatin   20 mg Oral Daily   sodium chloride  flush  10-40 mL Intracatheter Q12H   sodium chloride  flush  3 mL Intravenous Q12H   Continuous Infusions:  milrinone  0.125 mcg/kg/min (07/12/24 0700)   PRN Meds: acetaminophen , hydrALAZINE , ipratropium-albuterol , meclizine , ondansetron  (ZOFRAN ) IV, sodium chloride  flush, sodium chloride  flush   Vital Signs  Vitals:   07/12/24 0034 07/12/24 0356 07/12/24 0500 07/12/24 0839  BP: (!) 144/86 129/72  (!) 158/87  Pulse: 97 97  95  Resp: 20 20  16   Temp: 98.1 F (36.7 C) 98.2 F (36.8 C)  98.6 F (37 C)  TempSrc:      SpO2: 95% 92%  94%  Weight:   115.4 kg   Height:        Intake/Output Summary (Last 24 hours) at 07/12/2024 1112 Last data filed at 07/12/2024 1023 Gross per 24 hour  Intake 874.99 ml  Output 4725 ml  Net -3850.01 ml      07/12/2024    5:00 AM 07/11/2024    5:00 AM 07/10/2024   10:15 AM  Last 3 Weights  Weight (lbs) 254 lb 6.6 oz 253 lb 8.5 oz 255 lb 1.6 oz   Weight (kg) 115.4 kg 115 kg 115.713 kg      Telemetry Sinus rhythm - Personally Reviewed  Physical Exam  GEN: No acute distress.   Neck: Difficult to assess JVD due to facial hair Cardiac: RRR, no murmurs, rubs, or gallops.  Respiratory: Clear to auscultation bilaterally. GI: Soft, nontender, non-distended  MS: No edema; No deformity. Neuro:  Nonfocal  Psych: Normal affect   Labs High Sensitivity Troponin:  No results for input(s): TROPONINIHS in the last 720 hours.   Chemistry Recent Labs  Lab 07/06/24 0525 07/07/24 0547 07/09/24 1343 07/10/24 0620 07/10/24 1537 07/11/24 0645 07/11/24 1500 07/12/24 0555  NA 138   < >  --  135   < > 135 133* 134*  K 4.0   < >  --  3.4*   < > 3.7 3.7 3.6  CL 104   < >  --  92*   < > 91* 89* 91*  CO2 19*   < >  --  29   < > 29 29 29   GLUCOSE 226*   < >  --  208*   < > 221* 311* 236*  BUN 18   < >  --  69*   < > 67* 56* 64*  CREATININE 1.34*   < >  --  1.98*   < > 1.97* 2.02* 2.18*  CALCIUM  9.7   < >  --  9.8   < > 9.6 9.3 9.7  MG  --    < >  --  2.3  --  2.4  --  2.4  PROT 7.5  --  7.7  --   --   --   --   --   ALBUMIN  4.2  --  4.2  --   --   --   --   --   AST 22  --  19  --   --   --   --   --   ALT 21  --  15  --   --   --   --   --   ALKPHOS 62  --  72  --   --   --   --   --   BILITOT 0.8  --  1.0  --   --   --   --   --   GFRNONAA >60   < >  --  39*   < > 39* 38* 35*  ANIONGAP 14   < >  --  14   < > 14 15 13    < > = values in this interval not displayed.    Lipids No results for input(s): CHOL, TRIG, HDL, LABVLDL, LDLCALC, CHOLHDL in the last 168 hours.  Hematology Recent Labs  Lab 07/06/24 0525  WBC 9.3  RBC 5.71  HGB 15.8  HCT 48.9  MCV 85.6  MCH 27.7  MCHC 32.3  RDW 13.2  PLT 169   Thyroid  No results for input(s): TSH, FREET4 in the last 168 hours.  BNP Recent Labs  Lab 07/06/24 0613 07/09/24 0748  PROBNP 2,488.0* 143.0    DDimer No results for input(s): DDIMER in the last 168 hours.    Radiology  No results found.  Cardiac Studies  06/23/2024 RHC Mildly elevated filling pressures with moderate to severely reduced CO   12/2023 Echo complete 1. Left ventricular ejection fraction, by estimation, is 35 to 40%. Left  ventricular ejection fraction by 2D MOD biplane is 35.0 %. Left  ventricular ejection fraction by PLAX is 36 %. The left ventricle has  moderately decreased function. The left  ventricle demonstrates global hypokinesis. There is mild left ventricular  hypertrophy. Left ventricular diastolic function could not be evaluated.   2. Right ventricular systolic function is low normal. The right  ventricular size is normal.   3. Left atrial size was mildly dilated.   4. The mitral valve is normal in structure. No evidence of mitral valve  regurgitation.   5. The aortic valve is tricuspid. Aortic valve regurgitation is mild to  moderate.   6. Aortic dilatation noted. There is mild dilatation of the aortic root,  measuring 40 mm.   7. The inferior vena cava is normal in size with greater than 50%  respiratory variability, suggesting right atrial pressure of 3 mmHg.   Patient Profile   53 y.o. male with a hx of ischemic and nonischemic cardiomyopathy status post subcutaneous ICD, frequent hospital admissions for heart failure exacerbation, CAD with known CTO RCA and moderate LAD/LCx disease, COPD/long smoking history, paroxysmal atrial fibrillation, presenting with acute decompensated heart failure.   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  RHC 06/23/2024 with mildly elevated filling pressures and moderate to severely reduced CO. - Has been IV diuresed during admission with net UOP -16 L - Volume status is difficult to assess due to body habitus.  Dry weight appears to be around 250 pounds, although patient reports weight around 235-240 in June feeling very well  at that time.  Weight today is up from yesterday at 254 - Continued on milrinone  and dapagliflozin  - BP trending up.  Will start hydralazine  25 mg 3 times daily for afterload reduction.  Can plan to transition back to Entresto  when renal function improves. - BUN/creatinine uptrending today.  Lasix  held. Will defer further diuresis to MD. - Given worsening renal function, will plan for RHC tomorrow.  Hold Xarelto  tonight  Paroxysmal atrial fibrillation - Evaluated by EP 05/2024 and an ablation was deferred - Maintaining sinus rhythm - Continue amiodarone  and carvedilol .  Holding Xarelto  tonight in preparation for RHC tomorrow.  Coronary artery disease - Catheterization 07/2023 showed stable disease compared to prior study in 2021 - No chest pain - Continue Zetia , Crestor , and Xarelto  in place of aspirin    For questions or updates, please contact [facility] Please consult [email or web address] for contact info under       Signed, Lesley LITTIE Maffucci, PA-C  07/12/2024, 11:12 AM

## 2024-07-12 NOTE — Inpatient Diabetes Management (Addendum)
 Inpatient Diabetes Program Recommendations  AACE/ADA: New Consensus Statement on Inpatient Glycemic Control (2015)  Target Ranges:  Prepandial:   less than 140 mg/dL      Peak postprandial:   less than 180 mg/dL (1-2 hours)      Critically ill patients:  140 - 180 mg/dL    Latest Reference Range & Units 07/11/24 08:16 07/11/24 12:02 07/11/24 17:00 07/11/24 21:05  Glucose-Capillary 70 - 99 mg/dL 766 (H)  17 units Novolog  @0931  231 (H)  17 units Novolog  @1342   34 units Semglee  @1343  277 (H)  21 units Novolog  @1900  350 (H)  4 units Novolog    (H): Data is abnormally high  Latest Reference Range & Units 07/12/24 08:40 07/12/24 12:07  Glucose-Capillary 70 - 99 mg/dL 734 (H)  21 units Novolog   36 units Semglee   310 (H)  25 units Novolog    (H): Data is abnormally high    Home DM Meds: Farxiga  10 mg daily Lantus  50 units q HS (reduce dose by 2 units if less than 90 mg/dL fasting) Humalog  20 units tid with meals  Mounjaro  12.5 mg weekly     Current Orders: Semglee  36 units daily Novolog  10 units tid with meals Novolog  0-20 units tid with meals and HS Farxiga  10 mg daily    MD- Note Semglee  increased to 36 units daily this AM  CBGs 265-310 today  Please consider:  1. Increase Semglee  to 40 units daily tomorrow AM (11/25)  2. Increase Novolog  Meal Coverage to 15 units TID with meals     --Will follow patient during hospitalization--  Adina Rudolpho Arrow RN, MSN, CDCES Diabetes Coordinator Inpatient Glycemic Control Team Team Pager: [phone] (8a-5p)

## 2024-07-13 ENCOUNTER — Encounter
Admission: EM | Disposition: A | Discharge status: Home / Self Care | Payer: Self-pay | Source: Home / Self Care | Attending: Internal Medicine

## 2024-07-13 ENCOUNTER — Inpatient Hospital Stay: Admit: 2024-07-13 | Discharge: 2024-07-13 | Disposition: A | Attending: Internal Medicine | Admitting: Internal Medicine

## 2024-07-13 ENCOUNTER — Encounter: Payer: Self-pay | Admitting: Internal Medicine

## 2024-07-13 DIAGNOSIS — I272 Pulmonary hypertension, unspecified: Secondary | ICD-10-CM

## 2024-07-13 DIAGNOSIS — I5023 Acute on chronic systolic (congestive) heart failure: Secondary | ICD-10-CM | POA: Diagnosis not present

## 2024-07-13 DIAGNOSIS — N179 Acute kidney failure, unspecified: Secondary | ICD-10-CM | POA: Diagnosis not present

## 2024-07-13 DIAGNOSIS — R570 Cardiogenic shock: Secondary | ICD-10-CM | POA: Diagnosis not present

## 2024-07-13 DIAGNOSIS — R1900 Intra-abdominal and pelvic swelling, mass and lump, unspecified site: Secondary | ICD-10-CM | POA: Diagnosis not present

## 2024-07-13 HISTORY — PX: RIGHT HEART CATH: CATH118263

## 2024-07-13 LAB — BASIC METABOLIC PANEL WITH GFR
Anion gap: 11 (ref 5–15)
BUN: 48 mg/dL — ABNORMAL HIGH (ref 6–20)
CO2: 30 mmol/L (ref 22–32)
Calcium: 9.3 mg/dL (ref 8.9–10.3)
Chloride: 94 mmol/L — ABNORMAL LOW (ref 98–111)
Creatinine, Ser: 1.63 mg/dL — ABNORMAL HIGH (ref 0.61–1.24)
GFR, Estimated: 49 mL/min — ABNORMAL LOW (ref >60)
Glucose, Bld: 173 mg/dL — ABNORMAL HIGH (ref 70–99)
Potassium: 3.5 mmol/L (ref 3.5–5.1)
Sodium: 135 mmol/L (ref 135–145)

## 2024-07-13 LAB — POCT I-STAT EG7
Acid-Base Excess: 6 mmol/L — ABNORMAL HIGH (ref 0.0–2.0)
Bicarbonate: 31.6 mmol/L — ABNORMAL HIGH (ref 20.0–28.0)
Calcium, Ion: 1.18 mmol/L (ref 1.15–1.40)
HCT: 49 % (ref 39.0–52.0)
Hemoglobin: 16.7 g/dL (ref 13.0–17.0)
O2 Saturation: 65 %
Potassium: 3.6 mmol/L (ref 3.5–5.1)
Sodium: 137 mmol/L (ref 135–145)
TCO2: 33 mmol/L — ABNORMAL HIGH (ref 22–32)
pCO2, Ven: 47.3 mmHg (ref 44–60)
pH, Ven: 7.433 — ABNORMAL HIGH (ref 7.25–7.43)
pO2, Ven: 33 mmHg (ref 32–45)

## 2024-07-13 LAB — GLUCOSE, CAPILLARY
Glucose-Capillary: 206 mg/dL — ABNORMAL HIGH (ref 70–99)
Glucose-Capillary: 169 mg/dL — ABNORMAL HIGH (ref 70–99)
Glucose-Capillary: 144 mg/dL — ABNORMAL HIGH (ref 70–99)
Glucose-Capillary: 264 mg/dL — ABNORMAL HIGH (ref 70–99)
Glucose-Capillary: 237 mg/dL — ABNORMAL HIGH (ref 70–99)

## 2024-07-13 LAB — CBC
HCT: 48.8 % (ref 39.0–52.0)
Hemoglobin: 16.2 g/dL (ref 13.0–17.0)
MCH: 27.1 pg (ref 26.0–34.0)
MCHC: 33.2 g/dL (ref 30.0–36.0)
MCV: 81.7 fL (ref 80.0–100.0)
Platelets: 156 K/uL (ref 150–400)
RBC: 5.97 MIL/uL — ABNORMAL HIGH (ref 4.22–5.81)
RDW: 13.1 % (ref 11.5–15.5)
WBC: 10.2 K/uL (ref 4.0–10.5)
nRBC: 0 % (ref 0.0–0.2)

## 2024-07-13 LAB — MAGNESIUM: Magnesium: 2.3 mg/dL (ref 1.7–2.4)

## 2024-07-13 SURGERY — RIGHT HEART CATH
Anesthesia: Moderate Sedation

## 2024-07-13 MED ORDER — FENTANYL CITRATE (PF) 100 MCG/2ML IJ SOLN
INTRAMUSCULAR | Status: AC
  Filled 2024-07-13: qty 2

## 2024-07-13 MED ORDER — POTASSIUM CHLORIDE CRYS ER 20 MEQ PO TBCR
40.0000 meq | EXTENDED_RELEASE_TABLET | Freq: Once | ORAL | Status: AC
  Administered 2024-07-13: 40 meq via ORAL
  Filled 2024-07-13: qty 2

## 2024-07-13 MED ORDER — PERFLUTREN LIPID MICROSPHERE
1.0000 mL | INTRAVENOUS | Status: AC | PRN
Start: 2024-07-13 — End: 2024-07-13
  Administered 2024-07-13: 2 mL via INTRAVENOUS

## 2024-07-13 MED ORDER — ASPIRIN 81 MG PO CHEW: 81.0000 mg | CHEWABLE_TABLET | ORAL | Status: DC

## 2024-07-13 MED ORDER — SODIUM CHLORIDE 0.9 % IV SOLN: 250.0000 mL | INTRAVENOUS | Status: DC | PRN

## 2024-07-13 MED ORDER — MILRINONE LACTATE IN DEXTROSE 20-5 MG/100ML-% IV SOLN
0.1250 ug/kg/min | INTRAVENOUS | Status: DC
  Administered 2024-07-14: 0.125 ug/kg/min via INTRAVENOUS
  Filled 2024-07-13: qty 100

## 2024-07-13 MED ORDER — HYDRALAZINE HCL 20 MG/ML IJ SOLN: 10.0000 mg | INTRAMUSCULAR | Status: AC | PRN

## 2024-07-13 MED ORDER — DIGOXIN 125 MCG PO TABS
0.0625 mg | ORAL_TABLET | Freq: Every day | ORAL | Status: DC
  Administered 2024-07-13 – 2024-07-14 (×2): 0.0625 mg via ORAL
  Filled 2024-07-13 (×2): qty 0.5

## 2024-07-13 MED ORDER — LIDOCAINE HCL (PF) 1 % IJ SOLN
INTRAMUSCULAR | Status: DC | PRN
  Administered 2024-07-13: 5 mL

## 2024-07-13 MED ORDER — LIDOCAINE HCL 1 % IJ SOLN
INTRAMUSCULAR | Status: AC
  Filled 2024-07-13: qty 20

## 2024-07-13 MED ORDER — HEPARIN (PORCINE) IN NACL 1000-0.9 UT/500ML-% IV SOLN
INTRAVENOUS | Status: DC | PRN
  Administered 2024-07-13 (×2): 500 mL

## 2024-07-13 MED ORDER — SODIUM CHLORIDE 0.9% FLUSH
3.0000 mL | Freq: Two times a day (BID) | INTRAVENOUS | Status: DC
  Administered 2024-07-13: 3 mL via INTRAVENOUS

## 2024-07-13 MED ORDER — SODIUM CHLORIDE 0.9% FLUSH
3.0000 mL | Freq: Two times a day (BID) | INTRAVENOUS | Status: DC
  Administered 2024-07-13 – 2024-07-14 (×3): 3 mL via INTRAVENOUS

## 2024-07-13 MED ORDER — SODIUM CHLORIDE 0.9% FLUSH: 3.0000 mL | INTRAVENOUS | Status: DC | PRN

## 2024-07-13 MED ORDER — SODIUM CHLORIDE 0.9 % IV SOLN
250.0000 mL | INTRAVENOUS | Status: DC | PRN
  Administered 2024-07-13: 250 mL via INTRAVENOUS

## 2024-07-13 MED ORDER — TORSEMIDE 20 MG PO TABS
60.0000 mg | ORAL_TABLET | Freq: Every day | ORAL | Status: DC
  Administered 2024-07-13 – 2024-07-14 (×2): 60 mg via ORAL
  Filled 2024-07-13 (×2): qty 3

## 2024-07-13 MED ORDER — HEPARIN (PORCINE) IN NACL 1000-0.9 UT/500ML-% IV SOLN
INTRAVENOUS | Status: AC
  Filled 2024-07-13: qty 1000

## 2024-07-13 MED ORDER — ASPIRIN 81 MG PO CHEW
81.0000 mg | CHEWABLE_TABLET | ORAL | Status: AC
  Administered 2024-07-13: 81 mg via ORAL
  Filled 2024-07-13: qty 1

## 2024-07-13 MED ORDER — HEPARIN SODIUM (PORCINE) 5000 UNIT/ML IJ SOLN: 5000.0000 [IU] | Freq: Three times a day (TID) | INTRAMUSCULAR | Status: DC

## 2024-07-13 MED ORDER — RIVAROXABAN 20 MG PO TABS
20.0000 mg | ORAL_TABLET | Freq: Every day | ORAL | Status: DC
  Administered 2024-07-13: 20 mg via ORAL
  Filled 2024-07-13: qty 1

## 2024-07-13 MED ORDER — MIDAZOLAM HCL 2 MG/2ML IJ SOLN
INTRAMUSCULAR | Status: AC
  Filled 2024-07-13: qty 2

## 2024-07-13 SURGICAL SUPPLY — 6 items
CATH SWAN GANZ 7F STRAIGHT (CATHETERS) IMPLANT
DRAPE BRACHIAL (DRAPES) IMPLANT
GLIDESHEATH SLENDER 7FR .021G (SHEATH) IMPLANT
GUIDEWIRE .025 260CM (WIRE) IMPLANT
PACK CARDIAC CATH (CUSTOM PROCEDURE TRAY) ×2 IMPLANT
SET ATX-X65L (MISCELLANEOUS) IMPLANT

## 2024-07-13 NOTE — Progress Notes (Signed)
 Heart Failure Stewardship Pharmacy Note  PCP: Franchot Isaiah LABOR, MD PCP-Cardiologist: [name], MD  HPI: Gabriel [name]. is a 53 y.o. male with atrial fibrillation, HTN, T2DM, COPD, CKD, asthma, NSVT, OSA, unprovoked PE, pulmonary hypertension, psoriasis, migraine, NASH, ICD placement 05/2022, and CHF who presented with upper abdominal pain, abdominal distention, and shortness of breath. On admission, proBNP was 2488, CO2 was 19, HS-troponin was 42, LFTs WNL, and lipase was 61. Chest x-ray noted no acute findings.   Pertinent cardiac history: RHC/LHC in 05/2020 noted CTO of proximal RCA filled via elft to right collaterals, moderate PH (PVR 7.4 WU), and severely reduced cardiac output/index. TTE 05/2021 with LVEF 40-45%. ICD placed 05/2022. TTE 07/2022 EF 40-45% with mild LVH, G1DD, severe hypokinesis of left ventricular, entire inferior and inferolateral wall. TTE 03/08/23 EF 45-50% with moderate LVH, trivial MR, mild dilatation of aortic root at 43 mm. TTE 10/2023 with LVEF 35-40%, global LV hypokinesis, severely dilated LV internal cavity size, mild LVH, small pericardial effusion, mild MR. TTE 12/2023 with LVEF 35-40%, mild LVH, low normal RV, mild LAE, mild/ moderate AR, mild dilatation of the aortic root, measuring 40 mm. RHC 06/2024 noted RA of 3, PA 34/15 (24), CO 4.3 + CI 1.8 via fick, CO 4.8 + CI 2 via thermo, PCWP 22, PAPi 6.3. Cardiology repeating RHC today.  Pertinent Lab Values: Creatinine  Date Value Ref Range Status  09/29/2023 1.20 0.61 - 1.24 mg/dL Final  97/80/7984 9.22 0.60 - 1.30 mg/dL Final   Creatinine, Ser  Date Value Ref Range Status  07/13/2024 1.63 (H) 0.61 - 1.24 mg/dL Final   BUN  Date Value Ref Range Status  07/13/2024 48 (H) 6 - 20 mg/dL Final  91/80/7974 43 (H) 6 - 24 mg/dL Final  97/80/7984 10 7 - 18 mg/dL Final   Potassium  Date Value Ref Range Status  07/13/2024 3.5 3.5 - 5.1 mmol/L Final  10/07/2013 3.8 3.5 - 5.1 mmol/L Final   Sodium   Date Value Ref Range Status  07/13/2024 135 135 - 145 mmol/L Final  04/06/2024 137 134 - 144 mmol/L Final  10/07/2013 139 136 - 145 mmol/L Final   B Natriuretic Peptide  Date Value Ref Range Status  06/28/2024 143.3 (H) 0.0 - 100.0 pg/mL Final    Comment:    Performed at [facility], [address] [place], [place] [address]   Magnesium   Date Value Ref Range Status  07/13/2024 2.3 1.7 - 2.4 mg/dL Final    Comment:    Performed at [facility], [address] [place], [place] [address]   Hemoglobin A1C  Date Value Ref Range Status  04/02/2024 8.8 (A) 4.0 - 5.6 % Final   Hgb A1c MFr Bld  Date Value Ref Range Status  01/01/2024 6.9 (H) 4.8 - 5.6 % Final    Comment:             Prediabetes: 5.7 - 6.4          Diabetes: >6.4          Glycemic control for adults with diabetes: <7.0    TSH  Date Value Ref Range Status  04/06/2024 1.600 0.450 - 4.500 uIU/mL Final    Vital Signs:  Temp:  [97.6 F (36.4 C)-98.6 F (37 C)] 98 F (36.7 C) (11/25 0329) Pulse Rate:  [82-95] 85 (11/25 0329) Cardiac Rhythm: Normal sinus rhythm (11/24 1900) Resp:  [16-20] 20 (11/25 0329) BP: (123-158)/(69-87) 132/75 (11/25 0329) SpO2:  [94 %-  97 %] 97 % (11/25 0329) Weight:  [116.2 kg (256 lb 2.8 oz)] 116.2 kg (256 lb 2.8 oz) (11/25 0457)  Intake/Output Summary (Last 24 hours) at 07/13/2024 0810 Last data filed at 07/13/2024 0100 Gross per 24 hour  Intake 901.18 ml  Output 1300 ml  Net -398.82 ml   Current Heart Failure Medications:  Loop diuretic: none Beta-Blocker: none ACEI/ARB/ARNI: none MRA: none SGLT2i: Farxiga  10 mg daily Other: milrinone  0.125 mcg/kg/min, hydralazine  25 mg TID  Prior to admission Heart Failure Medications:  Loop diuretic: torsemide  80 mg daily Beta-Blocker: carvedilol  3.125 mg BID ACEI/ARB/ARNI: Entresto  49-51 mg BID MRA: spironolactone  25 mg daily SGLT2i: Farxiga  10 mg daily Other: none  Assessment: 1. Acute on chronic systolic  heart failure (LVEF 64-59%) with known low cardiac output, due to NICM. NYHA class III symptoms.  -Symptoms: Reports symptoms are much improved. Denies orthopnea now. Shortness of breath and abdominal distention are back to baseline. Appetite is good. Reports dry weight was ~230 lbs several months ago, though his new dry weight may be closer to 245-250 lbs. No significant LEE. Ambulated the hall without issue yesterday. -Volume: Appears relatively euvolemic, though difficult to assess given body habitus and potential abdominal reservoir. Creatinine is improving after holding furosemide  x 1 day. Can determine further diuresis based on RHC numbers. If both RA and PCWP are low, could start oral diuretics tomorrow. If RA is moderate-high and PCWP is high, can titrate milrinone  and restart diuresis. -Hemodynamics: Lactic acid is cleared. Symptoms of low output improved on milrinone .COOX yesterday was stable. Currently on milrinone  0.125 mcg/kg/min, can titrate off or up pending CO/CI/filling pressures on RHC today. -BB: Continue holding carvedilol  while on milrinone . -ACEI/ARB/ARNI: Entresto  held given lower BP and AKI. Can consider resuming tomorrow if AKI continues to improve, especially given high BP currently. -MRA: Home spironolactone  held for AKI. Can consider resuming tomorrow if AKI continues to improve. -SGLT2i: Continue home Farxiga  10 mg daily -Other: would probably benefit from increasing hydralazine  to 50 mg TID given persistent HTN. Will have SVR after cath today. Hydralazine  can be stopped when Entresto  is restarted.  Plan: 1) Medication changes recommended at this time: - None. Above recommendations based on RHC findings.  2) Patient assistance: -Pending  3) Education: - Patient has been educated on current HF medications and potential additions to HF medication regimen - Patient verbalizes understanding that over the next few months, these medication doses may change and more  medications may be added to optimize HF regimen - Patient has been educated on basic disease state pathophysiology and goals of therapy  Please do not hesitate to reach out with questions or concerns,  Aldo Sondgeroth, PharmD, CPP, BCPS, [facility] Heart Failure Pharmacist  Phone - [phone] 07/13/2024 8:10 AM

## 2024-07-13 NOTE — Plan of Care (Signed)

## 2024-07-13 NOTE — Plan of Care (Signed)
   Problem: Nutritional: Goal: Maintenance of adequate nutrition will improve Outcome: Progressing

## 2024-07-13 NOTE — Interval H&P Note (Signed)
 History and Physical Interval Note:  07/13/2024 1:48 PM  Gabriel [name].  has presented today for surgery, with the diagnosis of acute heart failure with reduced ejection fraction.  The various methods of treatment have been discussed with the patient and family. After consideration of risks, benefits and other options for treatment, the patient has consented to  Procedure(s): RIGHT HEART CATH (N/A) as a surgical intervention.  The patient's history has been reviewed, patient examined, no change in status, stable for surgery.  I have reviewed the patient's chart and labs.  Questions were answered to the patient's satisfaction.     Sarah Zerby

## 2024-07-13 NOTE — Progress Notes (Signed)
 Rounding Note   Patient Name: Gabriel [name]. Date of Encounter: 07/13/2024  [facility] Cardiologist: [name], MD  Subjective Patient reports overall feeling well. Net - 16.4 L since admission. BUN/Cr increased yesterday and diuresis was subsequently held. Plan for RHC later today.   Scheduled Meds:  [MAR Hold] allopurinol   300 mg Oral Daily   [MAR Hold] amiodarone   200 mg Oral Daily   [MAR Hold] aspirin  EC  81 mg Oral Daily   [MAR Hold] Chlorhexidine  Gluconate Cloth  6 each Topical Daily   [MAR Hold] dapagliflozin  propanediol  10 mg Oral Daily   [MAR Hold] ezetimibe   10 mg Oral Daily   [MAR Hold] hydrALAZINE   25 mg Oral Q8H   [MAR Hold] insulin  aspart  0-20 Units Subcutaneous TID WC   [MAR Hold] insulin  aspart  0-5 Units Subcutaneous QHS   [MAR Hold] insulin  aspart  10 Units Subcutaneous TID WC   [MAR Hold] insulin  glargine-yfgn  36 Units Subcutaneous Daily   [MAR Hold] ipratropium  2 spray Each Nare Q8H   [MAR Hold] pantoprazole   40 mg Oral Daily   [MAR Hold] rosuvastatin   20 mg Oral Daily   [MAR Hold] sodium chloride  flush  10-40 mL Intracatheter Q12H   [MAR Hold] sodium chloride  flush  3 mL Intravenous Q12H   sodium chloride  flush  3 mL Intravenous Q12H   Continuous Infusions:  sodium chloride  250 mL (07/13/24 1215)   milrinone  0.125 mcg/kg/min (07/13/24 0338)   PRN Meds: sodium chloride , [MAR Hold] acetaminophen , [MAR Hold] hydrALAZINE , [MAR Hold] ipratropium-albuterol , [MAR Hold] meclizine , [MAR Hold] ondansetron  (ZOFRAN ) IV, [MAR Hold] sodium chloride  flush, [MAR Hold] sodium chloride  flush, sodium chloride  flush   Vital Signs  Vitals:   07/13/24 0329 07/13/24 0457 07/13/24 0826 07/13/24 1203  BP: 132/75  (!) 152/85 (!) 159/85  Pulse: 85  88 85  Resp: 20  17 (!) 22  Temp: 98 F (36.7 C)  98.1 F (36.7 C) 98.2 F (36.8 C)  TempSrc:    Oral  SpO2: 97%  97% 92%  Weight:  116.2 kg  116.2 kg  Height:    5' 11 (1.803 m)    Intake/Output  Summary (Last 24 hours) at 07/13/2024 1227 Last data filed at 07/13/2024 9171 Gross per 24 hour  Intake 648.15 ml  Output 1000 ml  Net -351.85 ml      07/13/2024   12:03 PM 07/13/2024    4:57 AM 07/12/2024    5:00 AM  Last 3 Weights  Weight (lbs) 256 lb 2.8 oz 256 lb 2.8 oz 254 lb 6.6 oz  Weight (kg) 116.2 kg 116.2 kg 115.4 kg      Telemetry Sinus rhythm - Personally Reviewed  Physical Exam  GEN: No acute distress.   Neck: Difficult to assess JVD due to facial hair Cardiac: RRR, no murmurs, rubs, or gallops.  Respiratory: Clear to auscultation bilaterally. GI: Soft, nontender, non-distended  MS: No edema; No deformity. Neuro:  Nonfocal  Psych: Normal affect   Labs High Sensitivity Troponin:  No results for input(s): TROPONINIHS in the last 720 hours.   Chemistry Recent Labs  Lab 07/09/24 1343 07/10/24 0620 07/11/24 0645 07/11/24 1500 07/12/24 0555 07/13/24 0540  NA  --    < > 135 133* 134* 135  K  --    < > 3.7 3.7 3.6 3.5  CL  --    < > 91* 89* 91* 94*  CO2  --    < > 29  29 29 30   GLUCOSE  --    < > 221* 311* 236* 173*  BUN  --    < > 67* 56* 64* 48*  CREATININE  --    < > 1.97* 2.02* 2.18* 1.63*  CALCIUM   --    < > 9.6 9.3 9.7 9.3  MG  --    < > 2.4  --  2.4 2.3  PROT 7.7  --   --   --   --   --   ALBUMIN  4.2  --   --   --   --   --   AST 19  --   --   --   --   --   ALT 15  --   --   --   --   --   ALKPHOS 72  --   --   --   --   --   BILITOT 1.0  --   --   --   --   --   GFRNONAA  --    < > 39* 38* 35* 49*  ANIONGAP  --    < > 14 15 13 11    < > = values in this interval not displayed.    Lipids No results for input(s): CHOL, TRIG, HDL, LABVLDL, LDLCALC, CHOLHDL in the last 168 hours.  Hematology Recent Labs  Lab 07/13/24 0540  WBC 10.2  RBC 5.97*  HGB 16.2  HCT 48.8  MCV 81.7  MCH 27.1  MCHC 33.2  RDW 13.1  PLT 156   Thyroid  No results for input(s): TSH, FREET4 in the last 168 hours.  BNP Recent Labs  Lab  07/09/24 0748  PROBNP 143.0    DDimer No results for input(s): DDIMER in the last 168 hours.   Radiology  No results found.  Cardiac Studies  06/23/2024 RHC Mildly elevated filling pressures with moderate to severely reduced CO   12/2023 Echo complete 1. Left ventricular ejection fraction, by estimation, is 35 to 40%. Left  ventricular ejection fraction by 2D MOD biplane is 35.0 %. Left  ventricular ejection fraction by PLAX is 36 %. The left ventricle has  moderately decreased function. The left  ventricle demonstrates global hypokinesis. There is mild left ventricular  hypertrophy. Left ventricular diastolic function could not be evaluated.   2. Right ventricular systolic function is low normal. The right  ventricular size is normal.   3. Left atrial size was mildly dilated.   4. The mitral valve is normal in structure. No evidence of mitral valve  regurgitation.   5. The aortic valve is tricuspid. Aortic valve regurgitation is mild to  moderate.   6. Aortic dilatation noted. There is mild dilatation of the aortic root,  measuring 40 mm.   7. The inferior vena cava is normal in size with greater than 50%  respiratory variability, suggesting right atrial pressure of 3 mmHg.   Patient Profile   53 y.o. male with a hx of ischemic and nonischemic cardiomyopathy status post subcutaneous ICD, frequent hospital admissions for heart failure exacerbation, CAD with known CTO RCA and moderate LAD/LCx disease, COPD/long smoking history, paroxysmal atrial fibrillation, presenting with acute decompensated heart failure.   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  RHC 06/23/2024 with mildly elevated filling pressures and moderate to severely reduced CO. - Has been IV diuresed during admission with net  UOP -16.4 L - Volume status is difficult to assess due to body habitus.  Dry weight  appears to be around 250 pounds, although patient reports weight around 235-240 in June feeling very well at that time.  Weight today is up from yesterday at 256 - Continued on milrinone  and dapagliflozin  - Continue hydralazine  25 mg 3 times daily for afterload reduction.  Can plan to transition back to Entresto  when renal function improves. - BUN/creatinine improved today after holding diuresis - Plan for RHC today. Further recommendations pending results.   Paroxysmal atrial fibrillation - Evaluated by EP 05/2024 and an ablation was deferred - Maintaining sinus rhythm - Continue amiodarone  and carvedilol  - Resume Xarelto  tonight  Coronary artery disease - Catheterization 07/2023 showed stable disease compared to prior study in 2021 - No chest pain - Continue Zetia , Crestor , and Xarelto  in place of aspirin    Informed Consent   Shared Decision Making/Informed Consent The risks, including but not limited to, [bleeding or vascular complications (1 in 500), pneumothorax (1 in 1600), arrhythmia (1 in 1000) and death (1 in 5000)], benefits (diagnostic support and/or management of heart failure, pulmonary hypertension) and alternatives of a right heart catheterization were discussed in detail with Mr. Molinari and he is willing to proceed.       For questions or updates, please contact [facility] Please consult [email or web address] for contact info under       Signed, Lesley LITTIE Maffucci, PA-C  07/13/2024, 12:27 PM

## 2024-07-13 NOTE — Inpatient Diabetes Management (Signed)
 Inpatient Diabetes Program Recommendations  AACE/ADA: New Consensus Statement on Inpatient Glycemic Control (2015)  Target Ranges:  Prepandial:   less than 140 mg/dL      Peak postprandial:   less than 180 mg/dL (1-2 hours)      Critically ill patients:  140 - 180 mg/dL     Latest Reference Range & Units 07/12/24 08:40 07/12/24 12:07 07/12/24 15:45 07/12/24 21:16 07/13/24 08:27  Glucose-Capillary 70 - 99 mg/dL 734 (H) 689 (H) 797 (H) 160 (H) 206 (H)   Home DM Meds: Farxiga  10 mg daily Lantus  50 units q HS (reduce dose by 2 units if less than 90 mg/dL fasting) Humalog  20 units tid with meals  Mounjaro  12.5 mg weekly   Current Orders: Semglee  36 units daily Novolog  10 units tid with meals Novolog  0-20 units tid with meals and HS Farxiga  10 mg daily  Note: glucose trends still elevated  Please consider:  1. Increase Semglee  to 40 units daily tomorrow AM (11/25)  2. Increase Novolog  Meal Coverage to 15 units TID with meals   Thanks,  Clotilda Bull RN, MSN, BC-ADM Inpatient Diabetes Coordinator Team Pager [phone] (8a-5p)

## 2024-07-13 NOTE — Progress Notes (Signed)
 Progress Note   Patient: Gabriel [name]. FMW:[id number] DOB: 08/22/1970 DOA: 07/06/2024     6 DOS: the patient was seen and examined on 07/13/2024   Brief hospital course: 53 y.o. male with medical history significant of chronic combined HFrEF and HFpEF, pulmonary hypertension, COPD, nonobstructive CAD, status post ICD, PE on Xarelto , OSA on CPAP, morbid obesity, HTN, IDDM, cirrhosis secondary to NASH, CKD stage IIIa, presented with increasing shortness of breath and abdominal pain.   Symptoms started 4-5 days ago, patient started to notice intermittent  stretching like abdominal pain, worsening with torso movement.  He also noticed increasing girth of his abdomen and he gained more than 10 pounds in last 7 days.  He tried to increase his torsemide  dosage to twice daily with no significant improvement.  Yesterday he started develop wheezing and dry cough and increasing shortness of breath and could not lie flat last night because of shortness of breath.  Denies any chest pain no fever or chills.  No ankle edema.   ED Course: Afebrile, borderline tachycardia blood pressure elevated 170/97 95% on room air.  CT chest abdomen showed no acute findings, blood work showed BUN 18 creatinine 1.3 glucose 220, bicarb 19, WBC 9.2 hemoglobin 15.8.  11/19.  Patient states he was 230 pounds back in January.  Yesterday's weight was 270 pounds.  Patient feels a little bit better today but short of breath.  Cardiology increased Lasix  to 3 times daily dosing 11/20.  Creatinine up to 2.14 hold Lasix  today. 11/21.  Creatinine still elevated at 1.94.  Holding Lasix  and Entresto  currently.  Patient's weight 255.29 pounds.  With lactic acid elevated and we started milrinone  drip and PICC line placed. 11/22.  Patient feeling little bit better.  Weight 255.1. 11/23.  Patient had some pain in his right upper quadrant last night.  Reviewed sonogram done on admission that did not show any acute cholecystitis.  Continue  milrinone  drip.  Weight 253.53. 11/24.  Creatinine up at 2.18, weight 254.41 11/25.  Right heart catheterization today to assess fluid status.  Still on milrinone  drip.  Creatinine down to 1.63, weight up to 256.17 pounds.  Assessment and Plan: * Cardiogenic shock [facility]) Patient started on milrinone  on 11/21.  Continue milrinone  drip.  Held Lasix  yesterday with creatinine elevation.  Last EF 35 to 40%.  Today's creatinine 1.63.  Right heart catheterization today to assess fluid status to determine whether more diuresis is needed or not.  Today's weight slightly elevated at 256.17.  (Patient's weight on admission 270.6 pounds and patient told me he was 230 pounds back in January)  Acute on chronic systolic heart failure (HCC) Continue milrinone  drip.  Lasix  held yesterday with creatinine elevation.  Holding Entresto  today.  Holding spironolactone .  Holding low-dose Coreg  (while on milrinone ).  Patient on Farxiga .  Last EF 35 to 40%.  Cardiology added hydralazine  yesterday.  Abdominal swelling Holding spironolactone  No further abdominal pain  Acute kidney injury superimposed on CKD AKI on CKD stage IIIa.  Creatinine rose to 2.14 on 11/20.  Creatinine rose to 2.18 on 11/24.  Creatinine 1.34 on presentation.  Monitor closely on milrinone .  Today's creatinine 1.63  PAF (paroxysmal atrial fibrillation) (HCC) On amiodarone  and Xarelto  (will hold for right heart cath)  Type 2 diabetes mellitus with hyperlipidemia (HCC) With hyperglycemia also.  Patient on Crestor  and will continue 36 units of Semglee  insulin  and 10 units of NovoLog  3 times daily  Hypokalemia Supplemented  NASH (nonalcoholic steatohepatitis) Outpatient follow-up.  Obesity (BMI 30-39.9) BMI 35.73, class II obesity.  History of pulmonary embolism On Xarelto  (hold for right heart cath)  Polycythemia vera (HCC) Follow-up as outpatient  COPD (chronic obstructive pulmonary disease) (HCC) Respiratory status stable.         Subjective: Patient feels better than when he came in.  Still with some shortness of breath and abdominal distention.  Admitted with shortness of breath and heart failure exacerbation.  His abdomen is also distended.  Physical Exam: Vitals:   07/13/24 0826 07/13/24 1203 07/13/24 1348 07/13/24 1434  BP: (!) 152/85 (!) 159/85  (!) 181/78  Pulse: 88 85  69  Resp: 17 (!) 22  20  Temp: 98.1 F (36.7 C) 98.2 F (36.8 C)    TempSrc:  Oral    SpO2: 97% 92% 95% 94%  Weight:  116.2 kg    Height:  5' 11 (1.803 m)     Physical Exam HENT:     Head: Normocephalic.     Mouth/Throat:     Pharynx: No oropharyngeal exudate.  Eyes:     General: Lids are normal.     Conjunctiva/sclera: Conjunctivae normal.  Cardiovascular:     Rate and Rhythm: Normal rate and regular rhythm.     Heart sounds: Normal heart sounds, S1 normal and S2 normal.  Pulmonary:     Breath sounds: Examination of the right-lower field reveals decreased breath sounds. Examination of the left-lower field reveals decreased breath sounds. Decreased breath sounds present. No wheezing, rhonchi or rales.  Abdominal:     General: There is distension.     Palpations: Abdomen is soft.     Tenderness: There is no abdominal tenderness.  Musculoskeletal:     Right lower leg: No swelling.     Left lower leg: No swelling.  Skin:    General: Skin is warm.     Findings: No rash.  Neurological:     Mental Status: He is alert and oriented to person, place, and time.     Data Reviewed: Weight 256.17, last hemoglobin 16.7, white blood cell count 10.2, platelet count 156 creatinine 1.63, sodium 137, potassium 3.6, chloride 94, CO2 30  Disposition: Status is: Inpatient Remains inpatient appropriate because: Right heart catheterization today to determine fluid status.  Difficult to determine.  Depending on results of this test will determine whether he needs more diuresis or not.  Planned Discharge Destination: Home    Time spent:  28 minutes  Author: Charlie Patterson, MD 07/13/2024 2:51 PM  For on call review [email or web address].

## 2024-07-14 DIAGNOSIS — I25118 Atherosclerotic heart disease of native coronary artery with other forms of angina pectoris: Secondary | ICD-10-CM | POA: Diagnosis not present

## 2024-07-14 DIAGNOSIS — R0602 Shortness of breath: Secondary | ICD-10-CM | POA: Diagnosis not present

## 2024-07-14 DIAGNOSIS — R1900 Intra-abdominal and pelvic swelling, mass and lump, unspecified site: Secondary | ICD-10-CM | POA: Diagnosis not present

## 2024-07-14 DIAGNOSIS — I5023 Acute on chronic systolic (congestive) heart failure: Secondary | ICD-10-CM | POA: Diagnosis not present

## 2024-07-14 LAB — GLUCOSE, CAPILLARY
Glucose-Capillary: 249 mg/dL — ABNORMAL HIGH (ref 70–99)
Glucose-Capillary: 236 mg/dL — ABNORMAL HIGH (ref 70–99)

## 2024-07-14 LAB — ECHOCARDIOGRAM COMPLETE
Area-P 1/2: 4.82 cm2
Height: 71 in
S' Lateral: 4.6 cm
Weight: 4098.79 [oz_av]

## 2024-07-14 LAB — BASIC METABOLIC PANEL WITH GFR
Anion gap: 13 (ref 5–15)
BUN: 40 mg/dL — ABNORMAL HIGH (ref 6–20)
CO2: 29 mmol/L (ref 22–32)
Calcium: 9.2 mg/dL (ref 8.9–10.3)
Chloride: 95 mmol/L — ABNORMAL LOW (ref 98–111)
Creatinine, Ser: 1.76 mg/dL — ABNORMAL HIGH (ref 0.61–1.24)
GFR, Estimated: 45 mL/min — ABNORMAL LOW (ref >60)
Glucose, Bld: 240 mg/dL — ABNORMAL HIGH (ref 70–99)
Potassium: 3.4 mmol/L — ABNORMAL LOW (ref 3.5–5.1)
Sodium: 138 mmol/L (ref 135–145)

## 2024-07-14 LAB — MAGNESIUM: Magnesium: 2.1 mg/dL (ref 1.7–2.4)

## 2024-07-14 MED ORDER — HYDRALAZINE HCL 25 MG PO TABS
25.0000 mg | ORAL_TABLET | Freq: Three times a day (TID) | ORAL | 0 refills | Status: AC
Start: 2024-07-14 — End: ?

## 2024-07-14 MED ORDER — POTASSIUM CHLORIDE CRYS ER 20 MEQ PO TBCR
40.0000 meq | EXTENDED_RELEASE_TABLET | Freq: Once | ORAL | Status: AC
  Administered 2024-07-14: 40 meq via ORAL
  Filled 2024-07-14: qty 2

## 2024-07-14 MED ORDER — CARVEDILOL 3.125 MG PO TABS: 3.1250 mg | ORAL_TABLET | Freq: Two times a day (BID) | ORAL | Status: DC

## 2024-07-14 MED ORDER — FUROSEMIDE 10 MG/ML IJ SOLN: 80.0000 mg | Freq: Once | INTRAMUSCULAR | Status: DC

## 2024-07-14 MED ORDER — DIGOXIN 62.5 MCG PO TABS: 0.0625 mg | ORAL_TABLET | Freq: Every day | ORAL | 0 refills | Status: DC

## 2024-07-14 MED ORDER — FUROSEMIDE 10 MG/ML IJ SOLN
40.0000 mg | Freq: Once | INTRAMUSCULAR | Status: AC
  Administered 2024-07-14: 40 mg via INTRAVENOUS
  Filled 2024-07-14: qty 4

## 2024-07-14 MED ORDER — CARVEDILOL 3.125 MG PO TABS
3.1250 mg | ORAL_TABLET | Freq: Two times a day (BID) | ORAL | Status: DC
  Administered 2024-07-14: 3.125 mg via ORAL
  Filled 2024-07-14: qty 1

## 2024-07-14 MED ORDER — TORSEMIDE 60 MG PO TABS: 60.0000 mg | ORAL_TABLET | Freq: Every day | ORAL | 0 refills | Status: AC

## 2024-07-14 MED ORDER — POTASSIUM CHLORIDE 20 MEQ PO PACK: 40.0000 meq | PACK | Freq: Once | ORAL | Status: DC

## 2024-07-14 NOTE — Progress Notes (Signed)
 Patient refused to wait for 30 minutes after PICC line removal by IV team before going home.

## 2024-07-14 NOTE — Plan of Care (Signed)
  Problem: Clinical Measurements: Goal: Ability to maintain clinical measurements within normal limits will improve Outcome: Progressing   Problem: Activity: Goal: Risk for activity intolerance will decrease Outcome: Progressing   Problem: Elimination: Goal: Will not experience complications related to bowel motility Outcome: Progressing

## 2024-07-14 NOTE — Discharge Summary (Signed)
 Physician Discharge Summary   Patient: Gabriel [name]. MRN: [id number]  DOB: September 01, 1970   Admit:     Date of Admission: 07/06/2024 Admitted from: home   Discharge: Date of discharge: 07/14/24 Disposition: Home health Condition at discharge: good  CODE STATUS: FULL CODE     Discharge Physician: Laneta Blunt, DO [facility]     PCP: Franchot Isaiah LABOR, MD  Recommendations for Outpatient Follow-up:  Follow up with PCP Franchot Isaiah LABOR, MD in 1-2 weeks Follow as directed w/ cardiology    Discharge Instructions     Diet - low sodium heart healthy   Complete by: As directed    Diet Carb Modified   Complete by: As directed    Increase activity slowly   Complete by: As directed          Discharge Diagnoses: Principal Problem:   Cardiogenic shock (HCC) Active Problems:   Coronary artery disease of native artery of native heart with stable angina pectoris   Acute on chronic systolic heart failure (HCC)   Abdominal swelling   Acute kidney injury superimposed on CKD   PAF (paroxysmal atrial fibrillation) (HCC)   Type 2 diabetes mellitus with hyperlipidemia (HCC)   Hypokalemia   NASH (nonalcoholic steatohepatitis)   Obesity (BMI 30-39.9)   History of pulmonary embolism   Polycythemia vera (HCC)   Acute on chronic HFrEF (heart failure with reduced ejection fraction) (HCC)   COPD (chronic obstructive pulmonary disease) (HCC)   Shortness of breath      Hospital course / significant events:   53 y.o. male with medical history significant of chronic combined HFrEF and HFpEF, pulmonary hypertension, COPD, nonobstructive CAD, status post ICD, PE on Xarelto , OSA on CPAP, morbid obesity, HTN, IDDM, cirrhosis secondary to NASH, CKD stage IIIa, presented with increasing shortness of breath and abdominal pain.   HPI: Symptoms started 4-5 days ago, patient started to notice intermittent stretching like abdominal pain, worsening with torso movement.   He also noticed increasing girth of his abdomen and he gained more than 10 pounds in last 7 days.  He tried to increase his torsemide  dosage to twice daily with no significant improvement.  Yesterday he started develop wheezing and dry cough and increasing shortness of breath and could not lie flat last night because of shortness of breath.  Denies any chest pain no fever or chills.  No ankle edema.  ED Course: Afebrile, borderline tachycardia blood pressure elevated 170/97 95% on room air.  CT chest abdomen showed no acute findings, blood work showed BUN 18 creatinine 1.3 glucose 220, bicarb 19, WBC 9.2 hemoglobin 15.8.  11/19.  Patient states he was 230 pounds back in January.  Yesterday's weight was 270 pounds.  Patient feels a little bit better today but short of breath.  Cardiology increased Lasix  to 3 times daily dosing 11/20.  Creatinine up to 2.14 hold Lasix  today. 11/21.  Creatinine still elevated at 1.94.  Holding Lasix  and Entresto  currently.  Patient's weight 255.29 pounds.  With lactic acid elevated and we started milrinone  drip and PICC line placed. 11/22.  Patient feeling little bit better.  Weight 255.1. 11/23.  Patient had some pain in his right upper quadrant last night.  Reviewed sonogram done on admission that did not show any acute cholecystitis.  Continue milrinone  drip.  Weight 253.53. 11/24.  Creatinine up at 2.18, weight 254.41 11/25.  Right heart catheterization today to assess fluid status - mildlly increased L and R filling  pressures.  Still on milrinone  drip hoping to wean off, cardiology team thinkg max diuresis has been achieved. Added digoxin , started torsemide , ideally will be able to wean milrinone  tomorrow but if not tolerating this may need to consider transfer to advanced CHF team at [facility]. At this time, he is not a candidate for advanced therapies such as LVAD given that his LVEF greater than 35% and obesity.    11/26: Off milrinone . Per cardiology ok for discharge  home      Consultants:  Cardiology   Procedures/Surgeries: Cardiac cath 11/25      ASSESSMENT & PLAN:   Cardiogenic shock  Improved/resolved Follow outpatient See underlying cause(s) below    Acute on chronic systolic heart failure  Now off milrinone  drip. Farxiga  10 daily, digoxin  0.0625 daily hydralazine  25 mg 3 times daily  torsemide  80 daily  Per cardiology consider setting up outpatient Lasix  80 mg IV twice weekly through same-day surgery  PT requesting for dc home and ok per cardiology  Abdominal swelling Holding spironolactone  No further abdominal pain   Acute kidney injury superimposed on CKD AKI on CKD stage IIIa.  Creatinine rose to 2.14 on 11/20.  Creatinine rose to 2.18 on 11/24.  Creatinine 1.34 on presentation.  Monitor closely on milrinone .  Today's creatinine 1.63   PAF (paroxysmal atrial fibrillation)  On amiodarone  and Xarelto     Type 2 diabetes mellitus with hyperlipidemia  Resume home insulin    Hypokalemia Supplemented   NASH (nonalcoholic steatohepatitis) Outpatient follow-up.   Obesity (BMI 30-39.9) BMI 35.73, class II obesity.   History of pulmonary embolism On Xarelto  (hold for right heart cath)   Polycythemia vera (HCC) Follow-up as outpatient   COPD (chronic obstructive pulmonary disease) (HCC) Respiratory status stable.                Discharge Instructions  Allergies as of 07/14/2024       Reactions   Metformin  And Related Nausea And Vomiting   Ranitidine  Other (See Comments)   Zantac  [ranitidine  Hcl] Diarrhea, Nausea Only   Night sweats        Medication List     STOP taking these medications    aspirin  EC 81 MG tablet   ciclopirox  8 % solution Commonly known as: PENLAC    fenofibrate  145 MG tablet Commonly known as: TRICOR    Furoscix  80 MG/10ML Ctkt Generic drug: Furosemide    sacubitril -valsartan  49-51 MG Commonly known as: ENTRESTO    spironolactone  25 MG tablet Commonly known as:  ALDACTONE        TAKE these medications    Accu-Chek Softclix Lancets lancets USE UP TO FOUR TIMES DAILY   albuterol  108 (90 Base) MCG/ACT inhaler Commonly known as: VENTOLIN  HFA Inhale 2 puffs into the lungs every 4 (four) hours as needed for wheezing or shortness of breath.   allopurinol  300 MG tablet Commonly known as: ZYLOPRIM  Take 300 mg by mouth every morning.   amiodarone  200 MG tablet Commonly known as: PACERONE  Take 1 tablet (200 mg total) by mouth daily.   BD Pen Needle Nano 2nd Gen 32G X 4 MM Misc Generic drug: Insulin  Pen Needle USE  AS DIRECTED AT BEDTIME   Insulin  Pen Needle 32G X 4 MM Misc USE WITH INSULIN  IN THE MORNING, AT NOON, IN THE EVENING AND AT BEDTIME   blood glucose meter kit and supplies Kit Dispense based on patient and insurance preference. Use up to four times daily as directed.   carvedilol  3.125 MG tablet Commonly known as: COREG  Take  1 tablet (3.125 mg total) by mouth 2 (two) times daily with a meal.   dapagliflozin  propanediol 10 MG Tabs tablet Commonly known as: FARXIGA  Take 1 tablet (10 mg total) by mouth daily.   Digoxin  62.5 MCG Tabs Take 0.0625 mg by mouth daily. Start taking on: July 15, 2024   ezetimibe  10 MG tablet Commonly known as: ZETIA  Take 1 tablet (10 mg total) by mouth daily.   FreeStyle Libre 3 Plus Sensor Misc Place 1 sensor on the skin every 15 days. Use to check glucose continuously   hydrALAZINE  25 MG tablet Commonly known as: APRESOLINE  Take 1 tablet (25 mg total) by mouth every 8 (eight) hours.   insulin  lispro 100 UNIT/ML KwikPen Commonly known as: HUMALOG  Inject 20 Units into the skin 3 (three) times daily. + CORRECTION BEFORE BREAKFAST/LUNCH AND SUPPER (CORRECTION = 1 UNIT PER 50 MG/DL ABOVE 849); MAX DAILY DOSE 77 UNITS   ipratropium 0.06 % nasal spray Commonly known as: ATROVENT  Place 2 sprays into both nostrils every 8 (eight) hours.   ipratropium-albuterol  0.5-2.5 (3) MG/3ML Soln Commonly  known as: DUONEB Take 3 mLs by nebulization every 6 (six) hours as needed.   Lantus  SoloStar 100 UNIT/ML Solostar Pen Generic drug: insulin  glargine Inject 50 units into skin at bedtime. Decrease by 2 units every 2 days if fasting blood glucose is less than 90.   meclizine  25 MG tablet Commonly known as: ANTIVERT  Take 0.5 tablets (12.5 mg total) by mouth 3 (three) times daily as needed for dizziness.   Mounjaro  12.5 MG/0.5ML Pen Generic drug: tirzepatide  Inject 12.5 mg into the skin once a week.   nitroGLYCERIN  0.4 MG SL tablet Commonly known as: NITROSTAT  Place 1 tablet (0.4 mg total) under the tongue every 5 (five) minutes x 3 doses as needed for chest pain.   omeprazole  40 MG capsule Commonly known as: PRILOSEC Take 1 capsule (40 mg total) by mouth daily.   potassium chloride  SA 20 MEQ tablet Commonly known as: KLOR-CON  M Take 1 tablet (20 mEq total) by mouth every other day. AM   rivaroxaban  20 MG Tabs tablet Commonly known as: XARELTO  Take 1 tablet (20 mg total) by mouth daily with supper.   rosuvastatin  20 MG tablet Commonly known as: Crestor  Take 1 tablet (20 mg total) by mouth daily.   Torsemide  60 MG Tabs Take 60 mg by mouth daily. What changed:  medication strength how much to take Another medication with the same name was removed. Continue taking this medication, and follow the directions you see here.          Allergies  Allergen Reactions   Metformin  And Related Nausea And Vomiting   Ranitidine  Other (See Comments)   Zantac  [Ranitidine  Hcl] Diarrhea and Nausea Only    Night sweats     Subjective: pt eager for discharge home does not want to wait any more. All wuestions answered. Denies CP/SOB, no palpitations   Discharge Exam: BP (!) 179/87 (BP Location: Left Arm)   Pulse 88   Temp (!) 97.5 F (36.4 C)   Resp 20   Ht 5' 11 (1.803 m)   Wt 115.1 kg   SpO2 98%   BMI 35.39 kg/m  General: Pt is alert, awake, not in acute  distress Cardiovascular: RRR, S1/S2 +, no rubs, no gallops Respiratory: CTA bilaterally Abdominal: Soft, NT, ND, bowel sounds + Extremities: no edema, no cyanosis     The results of significant diagnostics from this hospitalization (including imaging, microbiology, ancillary and  laboratory) are listed below for reference.     Microbiology: Recent Results (from the past 240 hours)  Resp panel by RT-PCR (RSV, Flu A&B, Covid) Anterior Nasal Swab     Status: None   Collection Time: 07/06/24  5:25 AM   Specimen: Anterior Nasal Swab  Result Value Ref Range Status   SARS Coronavirus 2 by RT PCR NEGATIVE NEGATIVE Final    Comment: (NOTE) SARS-CoV-2 target nucleic acids are NOT DETECTED.  The SARS-CoV-2 RNA is generally detectable in upper respiratory specimens during the acute phase of infection. The lowest concentration of SARS-CoV-2 viral copies this assay can detect is 138 copies/mL. A negative result does not preclude SARS-Cov-2 infection and should not be used as the sole basis for treatment or other patient management decisions. A negative result may occur with  improper specimen collection/handling, submission of specimen other than nasopharyngeal swab, presence of viral mutation(s) within the areas targeted by this assay, and inadequate number of viral copies(<138 copies/mL). A negative result must be combined with clinical observations, patient history, and epidemiological information. The expected result is Negative.  Fact Sheet for Patients:  bloggercourse.com  Fact Sheet for Healthcare Providers:  seriousbroker.it  This test is no t yet approved or cleared by the [place]  FDA and  has been authorized for detection and/or diagnosis of SARS-CoV-2 by FDA under an Emergency Use Authorization (EUA). This EUA will remain  in effect (meaning this test can be used) for the duration of the COVID-19 declaration under Section  564(b)(1) of the Act, 21 U.S.C.section 360bbb-3(b)(1), unless the authorization is terminated  or revoked sooner.       Influenza A by PCR NEGATIVE NEGATIVE Final   Influenza B by PCR NEGATIVE NEGATIVE Final    Comment: (NOTE) The Xpert Xpress SARS-CoV-2/FLU/RSV plus assay is intended as an aid in the diagnosis of influenza from Nasopharyngeal swab specimens and should not be used as a sole basis for treatment. Nasal washings and aspirates are unacceptable for Xpert Xpress SARS-CoV-2/FLU/RSV testing.  Fact Sheet for Patients: bloggercourse.com  Fact Sheet for Healthcare Providers: seriousbroker.it  This test is not yet approved or cleared by the [place]  FDA and has been authorized for detection and/or diagnosis of SARS-CoV-2 by FDA under an Emergency Use Authorization (EUA). This EUA will remain in effect (meaning this test can be used) for the duration of the COVID-19 declaration under Section 564(b)(1) of the Act, 21 U.S.C. section 360bbb-3(b)(1), unless the authorization is terminated or revoked.     Resp Syncytial Virus by PCR NEGATIVE NEGATIVE Final    Comment: (NOTE) Fact Sheet for Patients: bloggercourse.com  Fact Sheet for Healthcare Providers: seriousbroker.it  This test is not yet approved or cleared by the [place]  FDA and has been authorized for detection and/or diagnosis of SARS-CoV-2 by FDA under an Emergency Use Authorization (EUA). This EUA will remain in effect (meaning this test can be used) for the duration of the COVID-19 declaration under Section 564(b)(1) of the Act, 21 U.S.C. section 360bbb-3(b)(1), unless the authorization is terminated or revoked.  Performed at [facility], [address] [place], [place] [address]      Labs: BNP (last 3 results) Recent Labs    04/06/24 1224 06/17/24 0958 06/28/24 0908  BNP 25.8  60.7 143.3*   Basic Metabolic Panel: Recent Labs  Lab 07/10/24 0620 07/10/24 1537 07/11/24 0645 07/11/24 1500 07/12/24 0555 07/13/24 0540 07/13/24 1410 07/14/24 0517  NA 135   < > 135 133* 134* 135 137  138  K 3.4*   < > 3.7 3.7 3.6 3.5 3.6 3.4*  CL 92*   < > 91* 89* 91* 94*  --  95*  CO2 29   < > 29 29 29 30   --  29  GLUCOSE 208*   < > 221* 311* 236* 173*  --  240*  BUN 69*   < > 67* 56* 64* 48*  --  40*  CREATININE 1.98*   < > 1.97* 2.02* 2.18* 1.63*  --  1.76*  CALCIUM  9.8   < > 9.6 9.3 9.7 9.3  --  9.2  MG 2.3  --  2.4  --  2.4 2.3  --  2.1   < > = values in this interval not displayed.   Liver Function Tests: Recent Labs  Lab 07/09/24 1343  AST 19  ALT 15  ALKPHOS 72  BILITOT 1.0  PROT 7.7  ALBUMIN  4.2   No results for input(s): LIPASE, AMYLASE in the last 168 hours. No results for input(s): AMMONIA in the last 168 hours. CBC: Recent Labs  Lab 07/13/24 0540 07/13/24 1410  WBC 10.2  --   HGB 16.2 16.7  HCT 48.8 49.0  MCV 81.7  --   PLT 156  --    Cardiac Enzymes: No results for input(s): CKTOTAL, CKMB, CKMBINDEX, TROPONINI in the last 168 hours. BNP: Invalid input(s): POCBNP CBG: Recent Labs  Lab 07/13/24 1434 07/13/24 1635 07/13/24 2146 07/14/24 0839 07/14/24 1143  GLUCAP 144* 264* 237* 249* 236*   D-Dimer No results for input(s): DDIMER in the last 72 hours. Hgb A1c No results for input(s): HGBA1C in the last 72 hours. Lipid Profile No results for input(s): CHOL, HDL, LDLCALC, TRIG, CHOLHDL, LDLDIRECT in the last 72 hours. Thyroid  function studies No results for input(s): TSH, T4TOTAL, T3FREE, THYROIDAB in the last 72 hours.  Invalid input(s): FREET3 Anemia work up No results for input(s): VITAMINB12, FOLATE, FERRITIN, TIBC, IRON, RETICCTPCT in the last 72 hours. Urinalysis    Component Value Date/Time   COLORURINE STRAW (A) 07/06/2024 0850   APPEARANCEUR CLEAR (A) 07/06/2024 0850    APPEARANCEUR Clear 03/08/2020 0951   LABSPEC 1.018 07/06/2024 0850   LABSPEC 1.019 11/12/2012 1713   PHURINE 5.0 07/06/2024 0850   GLUCOSEU >=500 (A) 07/06/2024 0850   GLUCOSEU Negative 11/12/2012 1713   HGBUR SMALL (A) 07/06/2024 0850   BILIRUBINUR NEGATIVE 07/06/2024 0850   BILIRUBINUR Negative 03/08/2020 0951   BILIRUBINUR Negative 11/12/2012 1713   KETONESUR NEGATIVE 07/06/2024 0850   PROTEINUR 100 (A) 07/06/2024 0850   NITRITE NEGATIVE 07/06/2024 0850   LEUKOCYTESUR NEGATIVE 07/06/2024 0850   LEUKOCYTESUR Negative 11/12/2012 1713   Sepsis Labs Recent Labs  Lab 07/13/24 0540  WBC 10.2   Microbiology Recent Results (from the past 240 hours)  Resp panel by RT-PCR (RSV, Flu A&B, Covid) Anterior Nasal Swab     Status: None   Collection Time: 07/06/24  5:25 AM   Specimen: Anterior Nasal Swab  Result Value Ref Range Status   SARS Coronavirus 2 by RT PCR NEGATIVE NEGATIVE Final    Comment: (NOTE) SARS-CoV-2 target nucleic acids are NOT DETECTED.  The SARS-CoV-2 RNA is generally detectable in upper respiratory specimens during the acute phase of infection. The lowest concentration of SARS-CoV-2 viral copies this assay can detect is 138 copies/mL. A negative result does not preclude SARS-Cov-2 infection and should not be used as the sole basis for treatment or other patient management decisions. A negative result may occur  with  improper specimen collection/handling, submission of specimen other than nasopharyngeal swab, presence of viral mutation(s) within the areas targeted by this assay, and inadequate number of viral copies(<138 copies/mL). A negative result must be combined with clinical observations, patient history, and epidemiological information. The expected result is Negative.  Fact Sheet for Patients:  bloggercourse.com  Fact Sheet for Healthcare Providers:  seriousbroker.it  This test is no t yet approved  or cleared by the [place]  FDA and  has been authorized for detection and/or diagnosis of SARS-CoV-2 by FDA under an Emergency Use Authorization (EUA). This EUA will remain  in effect (meaning this test can be used) for the duration of the COVID-19 declaration under Section 564(b)(1) of the Act, 21 U.S.C.section 360bbb-3(b)(1), unless the authorization is terminated  or revoked sooner.       Influenza A by PCR NEGATIVE NEGATIVE Final   Influenza B by PCR NEGATIVE NEGATIVE Final    Comment: (NOTE) The Xpert Xpress SARS-CoV-2/FLU/RSV plus assay is intended as an aid in the diagnosis of influenza from Nasopharyngeal swab specimens and should not be used as a sole basis for treatment. Nasal washings and aspirates are unacceptable for Xpert Xpress SARS-CoV-2/FLU/RSV testing.  Fact Sheet for Patients: bloggercourse.com  Fact Sheet for Healthcare Providers: seriousbroker.it  This test is not yet approved or cleared by the [place]  FDA and has been authorized for detection and/or diagnosis of SARS-CoV-2 by FDA under an Emergency Use Authorization (EUA). This EUA will remain in effect (meaning this test can be used) for the duration of the COVID-19 declaration under Section 564(b)(1) of the Act, 21 U.S.C. section 360bbb-3(b)(1), unless the authorization is terminated or revoked.     Resp Syncytial Virus by PCR NEGATIVE NEGATIVE Final    Comment: (NOTE) Fact Sheet for Patients: bloggercourse.com  Fact Sheet for Healthcare Providers: seriousbroker.it  This test is not yet approved or cleared by the [place]  FDA and has been authorized for detection and/or diagnosis of SARS-CoV-2 by FDA under an Emergency Use Authorization (EUA). This EUA will remain in effect (meaning this test can be used) for the duration of the COVID-19 declaration under Section 564(b)(1) of the Act,  21 U.S.C. section 360bbb-3(b)(1), unless the authorization is terminated or revoked.  Performed at [facility], [address], [place], [place] [address]    Imaging ECHOCARDIOGRAM COMPLETE Result Date: 07/14/2024    ECHOCARDIOGRAM REPORT   Patient Name:   Gabriel [name]. Date of Exam: 07/13/2024 Medical Rec #:  [id number]           Height:       71.0 in Accession #:    [id number]          Weight:       256.2 lb Date of Birth:  1970/09/17           BSA:          2.342 m Patient Age:    53 years            BP:           135/76 mmHg Patient Gender: M                   HR:           87 bpm. Exam Location:  [facility] Procedure: 2D Echo, Cardiac Doppler, Color Doppler and Intracardiac            Opacification Agent (Both Spectral and Color Flow Doppler were  utilized during procedure). Indications:     I50.9 Congestive heart failure  History:         Patient has prior history of Echocardiogram examinations, most                  recent 01/02/2024. Cardiomegaly and CHF, CAD and Previous                  Myocardial Infarction, Defibrillator, COPD and Stroke,                  Arrythmias:Atrial Fibrillation, Signs/Symptoms:Chest Pain and                  Syncope; Risk Factors:Hypertension, Diabetes and Dyslipidemia.                  Pulmonary hypertension. Obstructive sleep apnea-BiPAP.  Sonographer:     Carl Rodgers-Jones RDCS Referring Phys:  [id number] Diagnosing Phys: [name] MD  Sonographer Comments: Technically difficult study due to poor echo windows. IMPRESSIONS  1. Left ventricular ejection fraction, by estimation, is 35 to 40%. The left ventricle has mildly decreased function. The left ventricle demonstrates mild global hypokinesis with moderate inferior wall hypokinesis. The left ventricular internal cavity size was mildly dilated. Left ventricular diastolic parameters are consistent with Grade I diastolic dysfunction (impaired relaxation).  2. Right ventricular  systolic function is normal. The right ventricular size is mildly enlarged. Tricuspid regurgitation signal is inadequate for assessing PA pressure.  3. The mitral valve is normal in structure. No evidence of mitral valve regurgitation. No evidence of mitral stenosis.  4. The aortic valve is normal in structure. Aortic valve regurgitation is mild. Aortic valve sclerosis is present, with no evidence of aortic valve stenosis.  5. The inferior vena cava is normal in size with greater than 50% respiratory variability, suggesting right atrial pressure of 3 mmHg. FINDINGS  Left Ventricle: Left ventricular ejection fraction, by estimation, is 35 to 40%. The left ventricle has moderately decreased function. The left ventricle demonstrates global hypokinesis. Definity  contrast agent was given IV to delineate the left ventricular endocardial borders. Strain was performed and the global longitudinal strain is indeterminate. The left ventricular internal cavity size was mildly dilated. There is no left ventricular hypertrophy. Left ventricular diastolic parameters are consistent with Grade I diastolic dysfunction (impaired relaxation). Right Ventricle: The right ventricular size is mildly enlarged. No increase in right ventricular wall thickness. Right ventricular systolic function is normal. Tricuspid regurgitation signal is inadequate for assessing PA pressure. Left Atrium: Left atrial size was normal in size. Right Atrium: Right atrial size was normal in size. Pericardium: There is no evidence of pericardial effusion. Mitral Valve: The mitral valve is normal in structure. There is mild calcification of the mitral valve leaflet(s). No evidence of mitral valve regurgitation. No evidence of mitral valve stenosis. Tricuspid Valve: The tricuspid valve is normal in structure. Tricuspid valve regurgitation is not demonstrated. No evidence of tricuspid stenosis. Aortic Valve: The aortic valve is normal in structure. Aortic valve  regurgitation is mild. Aortic valve sclerosis is present, with no evidence of aortic valve stenosis. Pulmonic Valve: The pulmonic valve was normal in structure. Pulmonic valve regurgitation is not visualized. No evidence of pulmonic stenosis. Aorta: The aortic root is normal in size and structure. Venous: The inferior vena cava is normal in size with greater than 50% respiratory variability, suggesting right atrial pressure of 3 mmHg. IAS/Shunts: No atrial level shunt detected by color flow Doppler. Additional Comments:  3D was performed not requiring image post processing on an independent workstation and was indeterminate.  LEFT VENTRICLE PLAX 2D LVIDd:         6.30 cm   Diastology LVIDs:         4.60 cm   LV e' medial:    5.59 cm/s LV PW:         1.20 cm   LV E/e' medial:  8.8 LV IVS:        1.00 cm   LV e' lateral:   8.74 cm/s LVOT diam:     2.40 cm   LV E/e' lateral: 5.6 LV SV:         57 LV SV Index:   24 LVOT Area:     4.52 cm  RIGHT VENTRICLE RV Basal diam:  4.00 cm RV S prime:     18.93 cm/s TAPSE (M-mode): 2.0 cm LEFT ATRIUM             Index        RIGHT ATRIUM           Index LA diam:        4.90 cm 2.09 cm/m   RA Area:     16.50 cm LA Vol (A2C):   63.9 ml 27.28 ml/m  RA Volume:   50.30 ml  21.47 ml/m LA Vol (A4C):   65.8 ml 28.09 ml/m LA Biplane Vol: 68.1 ml 29.07 ml/m  AORTIC VALVE LVOT Vmax:   78.85 cm/s LVOT Vmean:  49.300 cm/s LVOT VTI:    0.126 m  AORTA Ao Root diam: 3.80 cm Ao Asc diam:  3.60 cm MITRAL VALVE MV Area (PHT): 4.82 cm    SHUNTS MV Decel Time: 157 msec    Systemic VTI:  0.13 m MV E velocity: 48.90 cm/s  Systemic Diam: 2.40 cm MV A velocity: 50.63 cm/s MV E/A ratio:  0.97 [name] MD Electronically signed by [name] MD Signature Date/Time: 07/14/2024/7:46:07 AM    Final    CARDIAC CATHETERIZATION Result Date: 07/13/2024 Conclusions: Mildly elevated left and right heart filling pressures (PCWP 14 mmHg, mean RA 7 mmHg). Mild-moderate pulmonary hypertension (PA  48/24, mean 32 mmHg). Mildly-moderately reduced cardiac output on milrinone  0.125 mcg/kg's per minute (Fick CO/CI 4.9 L/min, 2.1 L/min/m; thermodilution CO/CI 4.7 L/min, 2.0 L/min/m). Recommendations: Findings discussed with Dr. Rolan (advanced heart failure team).  Recommend continuation of milrinone  0.125 mcg/kg/min for 1 more day with initiation of digoxin  0.0625 mg daily and torsemide  60 mg p.o. daily. Continue current goal-directed medical therapy. Lonni Hanson, MD [facility]     Time coordinating discharge: over 30 minutes  SIGNED:  Shadana Pry DO [facility]

## 2024-07-14 NOTE — Inpatient Diabetes Management (Signed)
 Inpatient Diabetes Program Recommendations  AACE/ADA: New Consensus Statement on Inpatient Glycemic Control (2015)  Target Ranges:  Prepandial:   less than 140 mg/dL      Peak postprandial:   less than 180 mg/dL (1-2 hours)      Critically ill patients:  140 - 180 mg/dL    Latest Reference Range & Units 07/13/24 08:27 07/13/24 12:00 07/13/24 14:34 07/13/24 16:35 07/13/24 21:46 07/14/24 08:39  Glucose-Capillary 70 - 99 mg/dL 793 (H) 830 (H) 855 (H) 264 (H) 237 (H) 249 (H)   Home DM Meds: Farxiga  10 mg daily Lantus  50 units q HS (reduce dose by 2 units if less than 90 mg/dL fasting) Humalog  20 units tid with meals  Mounjaro  12.5 mg weekly   Current Orders: Semglee  36 units daily Novolog  10 units tid with meals Novolog  0-20 units tid with meals and HS Farxiga  10 mg daily  Note: glucose trends still elevated  Please consider:  1. Increase Semglee  to 40 units daily tomorrow AM (11/25)  2. Increase Novolog  Meal Coverage to 15 units TID with meals   Thanks,  Clotilda Bull RN, MSN, BC-ADM Inpatient Diabetes Coordinator Team Pager [phone] (8a-5p)

## 2024-07-14 NOTE — Progress Notes (Signed)
 Rounding Note   Patient Name: Gabriel [name]. Date of Encounter: 07/14/2024  [facility] Cardiologist: [name], MD   Subjective Resting in bed,  Abdomen less distended, -17 L this admission - Treated past several days on milrinone  infusion with IV Lasix  - Right heart catheterization with mildly elevated pressures - Started on digoxin  today   Scheduled Meds:  allopurinol   300 mg Oral Daily   amiodarone   200 mg Oral Daily   aspirin  EC  81 mg Oral Daily   Chlorhexidine  Gluconate Cloth  6 each Topical Daily   dapagliflozin  propanediol  10 mg Oral Daily   digoxin   0.0625 mg Oral Daily   ezetimibe   10 mg Oral Daily   hydrALAZINE   25 mg Oral Q8H   insulin  aspart  0-20 Units Subcutaneous TID WC   insulin  aspart  0-5 Units Subcutaneous QHS   insulin  aspart  10 Units Subcutaneous TID WC   insulin  glargine-yfgn  36 Units Subcutaneous Daily   ipratropium  2 spray Each Nare Q8H   pantoprazole   40 mg Oral Daily   potassium chloride   40 mEq Oral Once   rivaroxaban   20 mg Oral Q supper   rosuvastatin   20 mg Oral Daily   sodium chloride  flush  10-40 mL Intracatheter Q12H   sodium chloride  flush  3 mL Intravenous Q12H   sodium chloride  flush  3 mL Intravenous Q12H   torsemide   60 mg Oral Daily   Continuous Infusions:  sodium chloride      milrinone  Stopped (07/14/24 1105)   PRN Meds: sodium chloride , acetaminophen , hydrALAZINE , ipratropium-albuterol , meclizine , ondansetron  (ZOFRAN ) IV, sodium chloride  flush, sodium chloride  flush, sodium chloride  flush   Vital Signs  Vitals:   07/14/24 0439 07/14/24 0500 07/14/24 0519 07/14/24 0838  BP:    (!) 145/87  Pulse:    88  Resp: 17  15 19   Temp:    98.3 F (36.8 C)  TempSrc:      SpO2:    97%  Weight:  115.1 kg    Height:        Intake/Output Summary (Last 24 hours) at 07/14/2024 1132 Last data filed at 07/14/2024 1110 Gross per 24 hour  Intake 1307.68 ml  Output 3285 ml  Net -1977.32 ml       07/14/2024    5:00 AM 07/13/2024   12:03 PM 07/13/2024    4:57 AM  Last 3 Weights  Weight (lbs) 253 lb 12 oz 256 lb 2.8 oz 256 lb 2.8 oz  Weight (kg) 115.1 kg 116.2 kg 116.2 kg      Telemetry Normal sinus rhythm- Personally Reviewed  ECG   - Personally Reviewed  Physical Exam  GEN: No acute distress.   Neck: No JVD Cardiac: RRR, no murmurs, rubs, or gallops.  Respiratory: Clear to auscultation bilaterally. GI: nontender, distended but soft MS: No edema; No deformity. Neuro:  Nonfocal  Psych: Normal affect   Labs High Sensitivity Troponin:  No results for input(s): TROPONINIHS in the last 720 hours.   Chemistry Recent Labs  Lab 07/09/24 1343 07/10/24 0620 07/12/24 0555 07/13/24 0540 07/13/24 1410 07/14/24 0517  NA  --    < > 134* 135 137 138  K  --    < > 3.6 3.5 3.6 3.4*  CL  --    < > 91* 94*  --  95*  CO2  --    < > 29 30  --  29  GLUCOSE  --    < >  236* 173*  --  240*  BUN  --    < > 64* 48*  --  40*  CREATININE  --    < > 2.18* 1.63*  --  1.76*  CALCIUM   --    < > 9.7 9.3  --  9.2  MG  --    < > 2.4 2.3  --  2.1  PROT 7.7  --   --   --   --   --   ALBUMIN  4.2  --   --   --   --   --   AST 19  --   --   --   --   --   ALT 15  --   --   --   --   --   ALKPHOS 72  --   --   --   --   --   BILITOT 1.0  --   --   --   --   --   GFRNONAA  --    < > 35* 49*  --  45*  ANIONGAP  --    < > 13 11  --  13   < > = values in this interval not displayed.    Lipids No results for input(s): CHOL, TRIG, HDL, LABVLDL, LDLCALC, CHOLHDL in the last 168 hours.  Hematology Recent Labs  Lab 07/13/24 0540 07/13/24 1410  WBC 10.2  --   RBC 5.97*  --   HGB 16.2 16.7  HCT 48.8 49.0  MCV 81.7  --   MCH 27.1  --   MCHC 33.2  --   RDW 13.1  --   PLT 156  --    Thyroid  No results for input(s): TSH, FREET4 in the last 168 hours.  BNP Recent Labs  Lab 07/09/24 0748  PROBNP 143.0    DDimer No results for input(s): DDIMER in the last 168 hours.    Radiology  ECHOCARDIOGRAM COMPLETE Result Date: 07/14/2024    ECHOCARDIOGRAM REPORT   Patient Name:   Gabriel [name]. Date of Exam: 07/13/2024 Medical Rec #:  [id number]           Height:       71.0 in Accession #:    [id number]          Weight:       256.2 lb Date of Birth:  May 17, 1971           BSA:          2.342 m Patient Age:    53 years            BP:           135/76 mmHg Patient Gender: M                   HR:           87 bpm. Exam Location:  [facility] Procedure: 2D Echo, Cardiac Doppler, Color Doppler and Intracardiac            Opacification Agent (Both Spectral and Color Flow Doppler were            utilized during procedure). Indications:     I50.9 Congestive heart failure  History:         Patient has prior history of Echocardiogram examinations, most                  recent 01/02/2024. Cardiomegaly and CHF, CAD and  Previous                  Myocardial Infarction, Defibrillator, COPD and Stroke,                  Arrythmias:Atrial Fibrillation, Signs/Symptoms:Chest Pain and                  Syncope; Risk Factors:Hypertension, Diabetes and Dyslipidemia.                  Pulmonary hypertension. Obstructive sleep apnea-BiPAP.  Sonographer:     Carl Rodgers-Jones RDCS Referring Phys:  [id number] CHRISTOPHER END Diagnosing Phys: [name] MD  Sonographer Comments: Technically difficult study due to poor echo windows. IMPRESSIONS  1. Left ventricular ejection fraction, by estimation, is 35 to 40%. The left ventricle has mildly decreased function. The left ventricle demonstrates mild global hypokinesis with moderate inferior wall hypokinesis. The left ventricular internal cavity size was mildly dilated. Left ventricular diastolic parameters are consistent with Grade I diastolic dysfunction (impaired relaxation).  2. Right ventricular systolic function is normal. The right ventricular size is mildly enlarged. Tricuspid regurgitation signal is inadequate for assessing PA pressure.  3. The mitral valve is  normal in structure. No evidence of mitral valve regurgitation. No evidence of mitral stenosis.  4. The aortic valve is normal in structure. Aortic valve regurgitation is mild. Aortic valve sclerosis is present, with no evidence of aortic valve stenosis.  5. The inferior vena cava is normal in size with greater than 50% respiratory variability, suggesting right atrial pressure of 3 mmHg. FINDINGS  Left Ventricle: Left ventricular ejection fraction, by estimation, is 35 to 40%. The left ventricle has moderately decreased function. The left ventricle demonstrates global hypokinesis. Definity  contrast agent was given IV to delineate the left ventricular endocardial borders. Strain was performed and the global longitudinal strain is indeterminate. The left ventricular internal cavity size was mildly dilated. There is no left ventricular hypertrophy. Left ventricular diastolic parameters are consistent with Grade I diastolic dysfunction (impaired relaxation). Right Ventricle: The right ventricular size is mildly enlarged. No increase in right ventricular wall thickness. Right ventricular systolic function is normal. Tricuspid regurgitation signal is inadequate for assessing PA pressure. Left Atrium: Left atrial size was normal in size. Right Atrium: Right atrial size was normal in size. Pericardium: There is no evidence of pericardial effusion. Mitral Valve: The mitral valve is normal in structure. There is mild calcification of the mitral valve leaflet(s). No evidence of mitral valve regurgitation. No evidence of mitral valve stenosis. Tricuspid Valve: The tricuspid valve is normal in structure. Tricuspid valve regurgitation is not demonstrated. No evidence of tricuspid stenosis. Aortic Valve: The aortic valve is normal in structure. Aortic valve regurgitation is mild. Aortic valve sclerosis is present, with no evidence of aortic valve stenosis. Pulmonic Valve: The pulmonic valve was normal in structure. Pulmonic valve  regurgitation is not visualized. No evidence of pulmonic stenosis. Aorta: The aortic root is normal in size and structure. Venous: The inferior vena cava is normal in size with greater than 50% respiratory variability, suggesting right atrial pressure of 3 mmHg. IAS/Shunts: No atrial level shunt detected by color flow Doppler. Additional Comments: 3D was performed not requiring image post processing on an independent workstation and was indeterminate.  LEFT VENTRICLE PLAX 2D LVIDd:         6.30 cm   Diastology LVIDs:         4.60 cm   LV e' medial:    5.59  cm/s LV PW:         1.20 cm   LV E/e' medial:  8.8 LV IVS:        1.00 cm   LV e' lateral:   8.74 cm/s LVOT diam:     2.40 cm   LV E/e' lateral: 5.6 LV SV:         57 LV SV Index:   24 LVOT Area:     4.52 cm  RIGHT VENTRICLE RV Basal diam:  4.00 cm RV S prime:     18.93 cm/s TAPSE (M-mode): 2.0 cm LEFT ATRIUM             Index        RIGHT ATRIUM           Index LA diam:        4.90 cm 2.09 cm/m   RA Area:     16.50 cm LA Vol (A2C):   63.9 ml 27.28 ml/m  RA Volume:   50.30 ml  21.47 ml/m LA Vol (A4C):   65.8 ml 28.09 ml/m LA Biplane Vol: 68.1 ml 29.07 ml/m  AORTIC VALVE LVOT Vmax:   78.85 cm/s LVOT Vmean:  49.300 cm/s LVOT VTI:    0.126 m  AORTA Ao Root diam: 3.80 cm Ao Asc diam:  3.60 cm MITRAL VALVE MV Area (PHT): 4.82 cm    SHUNTS MV Decel Time: 157 msec    Systemic VTI:  0.13 m MV E velocity: 48.90 cm/s  Systemic Diam: 2.40 cm MV A velocity: 50.63 cm/s MV E/A ratio:  0.97 [name] MD Electronically signed by [name] MD Signature Date/Time: 07/14/2024/7:46:07 AM    Final    CARDIAC CATHETERIZATION Result Date: 07/13/2024 Conclusions: Mildly elevated left and right heart filling pressures (PCWP 14 mmHg, mean RA 7 mmHg). Mild-moderate pulmonary hypertension (PA 48/24, mean 32 mmHg). Mildly-moderately reduced cardiac output on milrinone  0.125 mcg/kg's per minute (Fick CO/CI 4.9 L/min, 2.1 L/min/m; thermodilution CO/CI 4.7 L/min, 2.0  L/min/m). Recommendations: Findings discussed with Dr. Rolan (advanced heart failure team).  Recommend continuation of milrinone  0.125 mcg/kg/min for 1 more day with initiation of digoxin  0.0625 mg daily and torsemide  60 mg p.o. daily. Continue current goal-directed medical therapy. Lonni Hanson, MD [facility]   Cardiac Studies   Patient Profile   Gabriel [name]. is a 53 y.o. male with a hx of ischemic and nonischemic cardiomyopathy, frequent hospital admissions for acute diastolic and systolic CHF, coronary artery disease, occluded RCA with collaterals,  COPD/long smoking history, paroxysmal atrial fibrillation, pulmonary hypertension, ICD, presenting with worsening weight gain and shortness of breath   A/P: --Acute on chronic diastolic and systolic CHF Presenting with weight gain, abdominal distention despite taking Lasix  80 daily, periodic metolazone , subcutaneous Lasix  as outpatient - On arrival elevated BNP 2400 - CT chest very small bilateral pleural effusions right greater than left -Treated this admission with IV Lasix  3 times daily, held briefly for worsening renal function - Milrinone  infusion augmented diuresis successful -He has indicated his desire to go home today - Continue Farxiga  10 daily, digoxin  0.0625 daily hydralazine  25 mg 3 times daily -At his request will discontinue milrinone  infusion -Resume torsemide  80 daily -Would consider setting up outpatient Lasix  80 mg IV twice weekly through same-day surgery   Paroxysmal atrial fibrillation Ablation deferred by EP on office visit October 2025 Maintaining normal sinus rhythm Continue Xarelto , amiodarone ,  Restart Coreg  3.125 twice daily  Coronary artery disease with stable angina Cardiac catheterization  August 19, 2023, stable  compared to prior study 2021 Denies anginal symptoms -Continue Zetia , Crestor  20, Xarelto  in place of aspirin    Long discussion concerning CHF management Daily weights,  restricting fluids, salt  For questions or updates, please contact [facility] Please consult [email or web address] for contact info under     Signed, Ceferino Lang, MD  07/14/2024, 11:32 AM

## 2024-07-14 NOTE — TOC Transition Note (Signed)
 Transition of Care [facility]) - Discharge Note   Patient Details  Name: Gabriel [name]. MRN: [id number] Date of Birth: [birth year]  Transition of Care [facility]) CM/SW Contact:  Lauraine JAYSON Carpen, LCSW Phone Number: 07/14/2024, 12:37 PM   Clinical Narrative:  Patient has orders to discharge home today. No further concerns. CSW signing off.   Final next level of care: Home/Self Care Barriers to Discharge: Barriers Resolved   Patient Goals and CMS Choice            Discharge Placement                Patient to be transferred to facility by: Self   Patient and family notified of of transfer: 07/14/24  Discharge Plan and Services Additional resources added to the After Visit Summary for       Post Acute Care Choice: NA                               Social Drivers of Health (SDOH) Interventions SDOH Screenings   Food Insecurity: No Food Insecurity (07/07/2024)  Housing: Unknown (07/07/2024)  Transportation Needs: No Transportation Needs (07/07/2024)  Utilities: Not At Risk (07/07/2024)  Alcohol Screen: Low Risk  (09/17/2022)  Depression (PHQ2-9): Low Risk  (06/17/2024)  Recent Concern: Depression (PHQ2-9) - Medium Risk (04/02/2024)  Financial Resource Strain: Medium Risk (05/06/2024)   Received from [facility]  Physical Activity: Insufficiently Active (01/20/2018)  Social Connections: Unknown (10/22/2023)  Stress: Stress Concern Present (09/02/2017)  Tobacco Use: Medium Risk (07/06/2024)  Health Literacy: Adequate Health Literacy (04/02/2024)     Readmission Risk Interventions    07/08/2024    4:21 PM 11/04/2023    8:19 PM  Readmission Risk Prevention Plan  Transportation Screening Complete Complete  PCP or Specialist Appt within 5-7 Days Complete   Medication Review (RN CM) Complete   Medication Review (RN Care Manager)  Complete  PCP or Specialist appointment within 3-5 days of discharge  Complete  HRI or Home Care Consult  Complete  SW  Recovery Care/Counseling Consult  Complete  Palliative Care Screening  Not Applicable  Skilled Nursing Facility  Not Applicable

## 2024-07-14 NOTE — Progress Notes (Signed)
 Heart Failure Stewardship Pharmacy Note  PCP: Franchot Isaiah LABOR, MD PCP-Cardiologist: [name], MD  HPI: Gabriel [name]. is a 53 y.o. male with atrial fibrillation, HTN, T2DM, COPD, CKD, asthma, NSVT, OSA, unprovoked PE, pulmonary hypertension, psoriasis, migraine, NASH, ICD placement 05/2022, and CHF who presented with upper abdominal pain, abdominal distention, and shortness of breath. On admission, proBNP was 2488, CO2 was 19, HS-troponin was 42, LFTs WNL, and lipase was 61. Chest x-ray noted no acute findings.   Pertinent cardiac history: RHC/LHC in 05/2020 noted CTO of proximal RCA filled via elft to right collaterals, moderate PH (PVR 7.4 WU), and severely reduced cardiac output/index. TTE 05/2021 with LVEF 40-45%. ICD placed 05/2022. TTE 07/2022 EF 40-45% with mild LVH, G1DD, severe hypokinesis of left ventricular, entire inferior and inferolateral wall. TTE 03/08/23 EF 45-50% with moderate LVH, trivial MR, mild dilatation of aortic root at 43 mm. TTE 10/2023 with LVEF 35-40%, global LV hypokinesis, severely dilated LV internal cavity size, mild LVH, small pericardial effusion, mild MR. TTE 12/2023 with LVEF 35-40%, mild LVH, low normal RV, mild LAE, mild/ moderate AR, mild dilatation of the aortic root, measuring 40 mm. RHC 06/2024 noted RA of 3, PA 34/15 (24), CO 4.3 + CI 1.8 via fick, CO 4.8 + CI 2 via thermo, PCWP 22, PAPi 6.3. Cardiology repeating RHC today.  Pertinent Lab Values: Creatinine  Date Value Ref Range Status  09/29/2023 1.20 0.61 - 1.24 mg/dL Final  97/80/7984 9.22 0.60 - 1.30 mg/dL Final   Creatinine, Ser  Date Value Ref Range Status  07/14/2024 1.76 (H) 0.61 - 1.24 mg/dL Final   BUN  Date Value Ref Range Status  07/14/2024 40 (H) 6 - 20 mg/dL Final  91/80/7974 43 (H) 6 - 24 mg/dL Final  97/80/7984 10 7 - 18 mg/dL Final   Potassium  Date Value Ref Range Status  07/14/2024 3.4 (L) 3.5 - 5.1 mmol/L Final  10/07/2013 3.8 3.5 - 5.1 mmol/L Final   Sodium   Date Value Ref Range Status  07/14/2024 138 135 - 145 mmol/L Final  04/06/2024 137 134 - 144 mmol/L Final  10/07/2013 139 136 - 145 mmol/L Final   B Natriuretic Peptide  Date Value Ref Range Status  06/28/2024 143.3 (H) 0.0 - 100.0 pg/mL Final    Comment:    Performed at [facility], [address] [place], [place] [address]   Magnesium   Date Value Ref Range Status  07/14/2024 2.1 1.7 - 2.4 mg/dL Final    Comment:    Performed at [facility], [address] [place], [place] [address]   Hemoglobin A1C  Date Value Ref Range Status  04/02/2024 8.8 (A) 4.0 - 5.6 % Final   Hgb A1c MFr Bld  Date Value Ref Range Status  01/01/2024 6.9 (H) 4.8 - 5.6 % Final    Comment:             Prediabetes: 5.7 - 6.4          Diabetes: >6.4          Glycemic control for adults with diabetes: <7.0    TSH  Date Value Ref Range Status  04/06/2024 1.600 0.450 - 4.500 uIU/mL Final    Vital Signs:  Temp:  [97.9 F (36.6 C)-98.4 F (36.9 C)] 98 F (36.7 C) (11/26 0404) Pulse Rate:  [69-92] 92 (11/26 0404) Cardiac Rhythm: Normal sinus rhythm (11/26 0810) Resp:  [15-22] 15 (11/26 0519) BP: (135-220)/(74-98) 138/81 (11/26 0404) SpO2:  [  92 %-97 %] 93 % (11/26 0404) Weight:  [115.1 kg (253 lb 12 oz)-116.2 kg (256 lb 2.8 oz)] 115.1 kg (253 lb 12 oz) (11/26 0500)  Intake/Output Summary (Last 24 hours) at 07/14/2024 9166 Last data filed at 07/14/2024 0500 Gross per 24 hour  Intake 1067.68 ml  Output 2110 ml  Net -1042.32 ml   Current Heart Failure Medications:  Loop diuretic: none Beta-Blocker: carvedilol  3.125 mg BID ACEI/ARB/ARNI: none MRA: none SGLT2i: Farxiga  10 mg daily Other: milrinone  0.125 mcg/kg/min, hydralazine  25 mg TID  Prior to admission Heart Failure Medications:  Loop diuretic: torsemide  80 mg daily Beta-Blocker: none ACEI/ARB/ARNI: Entresto  49-51 mg BID MRA: spironolactone  25 mg daily SGLT2i: Farxiga  10 mg daily Other: none  Assessment: 1.  Acute on chronic systolic heart failure (LVEF 35-40%) with known low cardiac output, due to NICM. NYHA class III symptoms.  -Symptoms: Reports symptoms are much improved. Very eager to go home. Walking the halls on milrinone . -Volume: RHC showed RA of of 7 and PCWP of 14. Transitioned to torsemide  60 mg daily. Renal function appears to be at recent baseline. -Hemodynamics: Lactic acid is cleared. Symptoms of low output improved on milrinone . RHC showed CI of 2.0 on milrinone , improved from previous. Now on digoxin . -BB: Would continue holding carvedilol . -ACEI/ARB/ARNI: Entresto  held given lower BP and AKI. Would consider resuming at 24-26 mg BID today. -MRA: Home spironolactone  held for AKI. Can consider resuming in clinic if creatinine is stable. -SGLT2i: Continue home Farxiga  10 mg daily -Other: Would probably benefit from increasing hydralazine  to 50 mg TID given persistent HTN. Will have SVR after cath today. Hydralazine  can be stopped when Entresto  is restarted.  Plan: 1) Medication changes recommended at this time: - Noted plan to transition off milrinone  today, monitor symptoms, and discharge if stable per patient insistence - Consider adding back Entresto  24-26 mg BID given elevated BPs and improving creatinine. eGFR is 45 and creatinine appears at recent baseline. If Entresto  is not restarted, would increase hydralazine  to 50 mg TID.  2) Patient assistance: -Pending  3) Education: - Patient has been educated on current HF medications and potential additions to HF medication regimen - Patient verbalizes understanding that over the next few months, these medication doses may change and more medications may be added to optimize HF regimen - Patient has been educated on basic disease state pathophysiology and goals of therapy  Please do not hesitate to reach out with questions or concerns,  Gabriel [name], PharmD, CPP, BCPS, [facility] Heart Failure Pharmacist  Phone - [phone] 07/14/2024  8:33 AM

## 2024-07-16 ENCOUNTER — Telehealth: Payer: Self-pay

## 2024-07-16 ENCOUNTER — Other Ambulatory Visit: Payer: Self-pay

## 2024-07-16 MED FILL — Continuous Glucose System Sensor: 30 days supply | Qty: 2 | Fill #5 | Status: AC

## 2024-07-16 NOTE — Transitions of Care (Post Inpatient/ED Visit) (Signed)
   07/16/2024  Name: Gabriel [name]. MRN: [id number] DOB: Nov 20, 1970  Today's TOC FU Call Status: Today's TOC FU Call Status:: Successful TOC FU Call Completed TOC FU Call Complete Date: 07/16/24  Patient's Name and Date of Birth confirmed. Name, DOB  Transition Care Management Follow-up Telephone Call Date of Discharge: 07/14/24 Discharge Facility[facility]) Type of Discharge: Inpatient Admission Primary Inpatient Discharge Diagnosis:: Cardiogenic shock How have you been since you were released from the hospital?: Better Any questions or concerns?: No  Items Reviewed: Did you receive and understand the discharge instructions provided?: Yes Medications obtained,verified, and reconciled?: Partial Review Completed Reason for Partial Mediation Review: I've got to go and check on my new med, I forgot about it Patient hung up. Any new allergies since your discharge?: No  Medications Reviewed Today: began review and patient realized he did not have Digoxin  stated, I have to go and get this Patient hung up. Medications Reviewed Today   Medications were not reviewed in this encounter      Richerd Fish, RN, BSN, CCM [place]  [facility], [place] [facility] Management Coordinator Direct Dial : [phone]

## 2024-07-19 ENCOUNTER — Telehealth: Payer: Self-pay | Admitting: Family

## 2024-07-19 NOTE — Telephone Encounter (Signed)
 Called to confirm/remind patient of their appointment at the [facility] on 07/20/24.   Appointment:   [x] Confirmed  [] Left mess   [] No answer/No voice mail  [] VM Full/unable to leave message  [] Phone not in service  Patient reminded to bring all medications and/or complete list.  Confirmed patient has transportation. Gave directions, instructed to utilize valet parking.

## 2024-07-19 NOTE — Progress Notes (Unsigned)
 Advanced Heart Failure Clinic Note    PCP: Ostwalt, Janna, PA  Primary Cardiologist: Gollan, Timothy, MD  HF cardiologist: Cherrie Sieving, MD   Chief Complaint: shortness of breath   HPI:  [name] is a 54 y/o male with a history of atrial fibrillation (03/25), HTN, T2DM, chronic chest pain, chronic troponin elevation, COPD, CKD, asthma, NSVT, OSA, unprovoked PE, pulmonary HTN, psoriasis, migraines, cirrhosis from NASH, ICD placement (10/23), previous tobacco use and chronic heart failure. Had left shoulder arthroscopy with debridement, decompression, rotator cuff repair and biceps tenodesis done 09/16/23.   RHC/LHC done 06/13/20 and showed: Severe single-vessel coronary artery disease with chronic total occlusion of the proximal RCA.  The distal vessel fills via left to right collaterals. Mild to moderate, nonobstructive CAD involving the LAD and LCx with up to 50% stenosis. Mildly elevated left and right heart filling pressures. Moderate pulmonary hypertension (mean PAP 40 mmHg) with significantly elevated pulmonary vascular resistance (PVR 7.4 WU). Severely reduced Fick cardiac output/index.  Echo 05/21/21: EF of 40-45% along with mild LVH and mild MR.   Admitted 09/2021 & 11/2021 with HF.   Echo 11/24/21: EF of 20-25% along with mild LVH and mild MR.  Echo 02/06/22: EF of 30-35% along with mild/moderate AR.   Admitted 05/20/22 for ICD placement and discharged the next day. Was in the ED 12/21/22 due to dizziness & chest pain.   Echo 08/14/22: EF 40-45% with mild LVH, Grade I DD, severe hypokinesis of left ventricular, entire inferior and inferolateral wall.  Echo 03/08/23: EF 45-50% with moderate LVH, trivial MR, mild dilatation of aortic root at 43 mm  Was in the ED 06/09/23 due to SOB/ chest pain for previous 2 days. CXR concerning for pneumonia so antibiotics provided. Initial troponin in the 50's which is close to his baseline. EKG without ischemic changes.   RHC/ LHC  08/19/23:  Relatively stable appearance of the coronary arteries since last catheterization in 2021.  Chronically occluded proximal RCA now has antegrade flow, though I suspect this represents bridging collaterals.  Left-to-right collateral remain as well as moderate LAD and LCx disease. Mildly elevated left heart filling pressures (LVEDP 20 mmHg, PCWP 18 mmHg). Moderate pulmonary hypertension (mean PA 41 mmHg, PVR 5.3 WU). Moderately reduced Fick cardiac output/index (CO 4.3 L/min, CI 1.9 L/min/m^2).  Admitted 10/22/23 due to COPD exacerbation secondary to influenza.   Admitted 11/03/23 with dizziness, tachycardia and new onset AF. Echo 3/18 -- EF 35-40%, global LV hypokinesis, severely dilated LV internal cavity size, mild LVH, small pericardial effusion, mild MR. IV diuresed, GDMT restarted. Successful cardioversion 11/06/23. Amiodarone  loaded.   Was seen in the HF clinic 12/05/23 where torsemide  was increased to 40mg  daily X 3 days and then decreased back to 20mg  daily.   Was seen in the HF clinic 12/10/23 & was sent for IV lasix  due to being fluid up. Zio monitor placed due to increased palpitations.   Echo 01/02/24: EF 35-40%, mild LVH, low normal RV, mild LAE, mild/ moderate AR, mild dilatation of the aortic root, measuring 40 mm   Admitted 02/06/24 with SOB and hypoxia for several days prior to admission. CXR showed edema, no effusions. ECG NSR. BNP 600. IV diuresed with transition to oral diuretics.   Seen in [facility] 06/17/24 and due to being fluid up, given 3 doses of metolazone  2.5mg  given and RHC was scheduled.   RHC 06/23/24:  RA = 3 RV = 36/5 PA = 34/15 (24) PCW = 22 Fick cardiac output/index = 4.3/1.8  TD CO/CI = 4.8/2.0 PVR = 0.4 WU Ao sat = 94%  PA sat = 59%, 64% PAPi = 6.3  Assessment: 1. Mildly elevated filling pressures with moderate to severely reduced CO  Admitted 07/06/24 with worsening shortness of breath and abdominal pain. Symptoms started 4-5 days prior to admission,  patient started to notice intermittent stretching like abdominal pain, worsening with torso movement. He also noticed increasing girth of his abdomen and he gained more than 10 pounds in last 7 days. He tried to increase his torsemide  dosage to twice daily with no significant improvement. Yesterday he started develop wheezing and dry cough and increasing shortness of breath and could not lie flat because of shortness of breath. CT chest abdomen showed no acute findings. Cardiology consulted with adjustment in diuretics. Lactic acid elevated and we started milrinone  drip and PICC line placed. Echo 07/13/24: EF 35-40%, G1DD, normal RV. RHC 07/13/24: Mildly elevated left and right heart filling pressures (PCWP 14 mmHg, mean RA 7 mmHg). Mild-moderate pulmonary hypertension (PA 48/24, mean 32 mmHg). Mildly-moderately reduced cardiac output on milrinone  0.125 mcg/kg's per minute (Fick CO/CI 4.9 L/min, 2.1 L/min/m; thermodilution CO/CI 4.7 L/min, 2.0 L/min/m). To consider weekly IV lasix  in outpatient setting.    He presents today for a post-hospital HF visit with a chief complaint of shortness of breath. Has associated fatigue, chronic chest pain, abdominal distention, abdominal pain (improving). Sleeping better. Has been taking digoxin  since hospital discharge and he says that it's making him feel funny after 10-15 minutes after taking medication. He's noted increased glucose levels since his recent admission. Overall he says that he feels better since recent admission but still feels like he's retaining fluid. He thinks that his oral medications aren't working as well due to his abdominal distention.    ROS: All systems negative except as listed in HPI, PMH and Problem List.  SH:  Social History   Socioeconomic History   Marital status: Widowed    Spouse name: Not on file   Number of children: 1   Years of education: 44   Highest education level: 12th grade  Occupational History   Occupation:  disabled  Tobacco Use   Smoking status: Former    Current packs/day: 0.00    Average packs/day: 0.5 packs/day for 33.0 years (16.5 ttl pk-yrs)    Types: Cigarettes    Start date: 05/09/1987    Quit date: 05/08/2020    Years since quitting: 4.2   Smokeless tobacco: Former    Quit date: 05/08/2020   Tobacco comments:    Quit Sept 2021  Vaping Use   Vaping status: Former  Substance and Sexual Activity   Alcohol use: Not Currently   Drug use: No   Sexual activity: Not Currently  Other Topics Concern   Not on file  Social History Narrative   Lives at home with wife and his dad's wife.        Works sales/advanced research scientist (life sciences); smoker; cutting; hx of alcoholism [started 13 year]; quit at age of 22 years.    Social Drivers of Health   Financial Resource Strain: Medium Risk (05/06/2024)   Received from [facility]   Overall Financial Resource Strain (CARDIA)    Difficulty of Paying Living Expenses: Somewhat hard  Food Insecurity: No Food Insecurity (07/07/2024)   Hunger Vital Sign    Worried About Running Out of Food in the Last Year: Never true    Ran Out of Food in the Last Year: Never true  Transportation Needs: No Transportation Needs (07/07/2024)   PRAPARE - Administrator, Civil Service (Medical): No    Lack of Transportation (Non-Medical): No  Physical Activity: Insufficiently Active (01/20/2018)   Exercise Vital Sign    Days of Exercise per Week: 7 days    Minutes of Exercise per Session: 10 min  Stress: Stress Concern Present (09/02/2017)   [name] of Occupational Health - Occupational Stress Questionnaire    Feeling of Stress : Very much  Social Connections: Unknown (10/22/2023)   Social Connection and Isolation Panel    Frequency of Communication with Friends and Family: Not on file    Frequency of Social Gatherings with Friends and Family: Patient declined    Attends Religious Services: Patient declined    Active Member of Clubs or  Organizations: Patient declined    Attends Banker Meetings: Patient declined    Marital Status: Patient declined  Intimate Partner Violence: Not At Risk (07/07/2024)   Humiliation, Afraid, Rape, and Kick questionnaire    Fear of Current or Ex-Partner: No    Emotionally Abused: No    Physically Abused: No    Sexually Abused: No    FH:  Family History  Problem Relation Age of Onset   Diabetes Mother    Diabetes Mellitus II Mother    Hypothyroidism Mother    Hypertension Mother    Kidney failure Mother        Dialysis   Heart attack Mother        49 yo approximately   Hypertension Father    Gout Father    Cancer Maternal Grandfather    Diabetes Maternal Grandfather    Cancer Paternal Aunt     Past Medical History:  Diagnosis Date   Anginal pain    Aortic atherosclerosis    Aortic root dilation 11/28/2016   a.) TTE 11/28/2016: 43 mm; b.) TTE 11/23/2019: 38 mm; c.) TTE 08/14/2022: 40 mm; d.) TTE 03/08/2023: 43 mm; e.) TTE 01/02/2024: 40 mm   Arteriosclerosis of right carotid artery    a.) carotid doppler 04/29/2017: <50% RICA   Atrial fibrillation with RVR (HCC)    a.) CHA2DS2VASc = 6 (HFrEF, HTN, CVA x 2, vascular disease, T2DM) as of 01/27/2024; b.) s/p DCCV 11/06/2023 --> 200 J x 1 -->  NSR; c.) rate/rhythm maintained on oral amiodarone  + carvedilol ; chronically anticoagulated with rivaroxaban    CAD (coronary artery disease)    a.) MV 04/2015: low risk; b.) LHC 01/06/2017: minor irregs in LAD/Diag/LCX/OM, RCA 40p/m/d; c.) R/LHC 06/13/2020: 50% dLAD, 30% p-mLCx, 40% OM1, 100% pRCA (L-R collats) - med mgmt; d.) R/LHC 08/19/2023: 50% dLAD, 30% p-mLCx, 40% OM1, 100% pRCA (L-R collats), 50% p-mRCA - med mgmt   Cardiomegaly    Cerebellar infarct (HCC)    a.) brain MRI 06/23/2023: tiny chronic infarct within the RIGHT cerebellar hemisphere; unsure of timing of neurological event; no deficits   Chest wall pain, chronic    Chronic 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     Chronic Troponin Elevation    CKD (chronic kidney disease), stage II-III    COPD (chronic obstructive pulmonary disease) (HCC)    CPAP (continuous positive airway pressure) dependence 03/08/2023   Depression    DM (diabetes mellitus), type 2 (HCC)    Dyspnea    GERD (gastroesophageal reflux disease)    Headache    HFrEF (heart failure with reduced ejection fraction) (HCC)    History of 2019 novel coronavirus disease (COVID-19) 03/01/2022   History  of kidney stones    History of methicillin resistant staphylococcus aureus (MRSA)    Hyperlipidemia    Hypertension    ICD (implantable cardioverter-defibrillator) in place 05/20/2022   a.) LEFT midaxillary [employer] Emblem MRI S-ICD (SN: [id number])   Left rotator cuff tear    Liver cirrhosis secondary to NASH [facility])    Long term current use of amiodarone     Long term current use of immunosuppressive drug    a.) ixekizumab for psoriasis/psoriatic arthritis diagnosis   Long-term use of aspirin  therapy    Mediastinal lymphadenopathy    Mitral regurgitation    Mild to moderate by October 2021 echocardiogram.   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 12/2015 noted on tele-->amio;  b. 12/2015 Event monitor: no VT noted.   Obesity (BMI 30.0-34.9)    On rivaroxaban  therapy    OSA treated with BiPAP    Pneumonia    Polyp of descending colon    Psoriasis    Psoriatic arthritis (HCC)    a.) Tx'd with ixekizumab   Pulmonary HTN (HCC) 02/29/2016   a.) TTE 02/29/2016: RVSP 47; b.) TTE 06/26/2019: RVSP 58.4; c.) TTE 12/08/2019: RVSP 40.8; d.) TTE 06/08/2020: RVSP 44.7; e.) R/LHC 06/08/2020: mRA 9, mPA 40, mPWCP 17, AO sat 98, PA sat 61, CO 3.1, CI  1.3, PVR 7.4, LVEDP 20; f.) R/LHC 08/18/2020: mRA 5, mPA 41, mPCWP 18, AO sat 92, PA sat 59, CO 4.3, CI 1.9, PVR 5.3   Recurrent pulmonary emboli (HCC) 06/07/2020   06/07/20: small bilateral PEs.  12/31/19: RUL and RLL PEs.   Secondary polycythemia    a.) negative for JAK2 with reflex and BCR-ABL mutations; seeing hematology with etiology felt to be secondary to underlying COPD (quit smoking 04/2020), heart disease, OSAH, and obesity   Stroke [facility])    Syncope    a. 01/2016 - felt to be vasovagal.   Thrombocytopenia     Current Outpatient Medications  Medication Sig Dispense Refill   Accu-Chek Softclix Lancets lancets USE UP TO FOUR TIMES DAILY     albuterol  (VENTOLIN  HFA) 108 (90 Base) MCG/ACT inhaler Inhale 2 puffs into the lungs every 4 (four) hours as needed for wheezing or shortness of breath. 8 g 6   allopurinol  (ZYLOPRIM ) 300 MG tablet Take 300 mg by mouth every morning.     amiodarone  (PACERONE ) 200 MG tablet Take 1 tablet (200 mg total) by mouth daily. 90 tablet 3   BD PEN NEEDLE NANO 2ND GEN 32G X 4 MM MISC USE  AS DIRECTED AT BEDTIME 200 each 0   blood glucose meter kit and supplies KIT Dispense based on patient and insurance preference. Use up to four times daily as directed. 1 each 11   carvedilol  (COREG ) 3.125 MG tablet Take 1 tablet (3.125 mg total) by mouth 2 (two) times daily with a meal. 60 tablet 3   Continuous Glucose Sensor (FREESTYLE LIBRE 3 PLUS SENSOR) MISC Place 1 sensor on the skin every 15 days. Use to check glucose continuously 2 each 12   dapagliflozin  propanediol (FARXIGA ) 10 MG TABS tablet Take 1 tablet (10 mg total) by mouth daily. 90 tablet 3   digoxin  62.5 MCG TABS Take 0.0625 mg  by mouth daily. 30 tablet 0   ezetimibe  (ZETIA ) 10 MG tablet Take 1 tablet (10 mg total) by mouth daily. 90 tablet 3   hydrALAZINE  (APRESOLINE ) 25 MG tablet Take 1 tablet (25 mg total) by mouth every 8 (eight) hours. 90 tablet 0   insulin  glargine (LANTUS  SOLOSTAR) 100 UNIT/ML Solostar  Pen Inject 50 units into skin at bedtime. Decrease by 2 units every 2 days if fasting blood glucose is less than 90. 15 mL 11   insulin  lispro (HUMALOG ) 100 UNIT/ML KwikPen Inject 20 Units into the skin 3 (three) times daily. + CORRECTION BEFORE BREAKFAST/LUNCH AND SUPPER (CORRECTION = 1 UNIT PER 50 MG/DL ABOVE 849); MAX DAILY DOSE 77 UNITS 15 mL 11   Insulin  Pen Needle 32G X 4 MM MISC USE WITH INSULIN  IN THE MORNING, AT NOON, IN THE EVENING AND AT BEDTIME 400 each 11   ipratropium (ATROVENT ) 0.06 % nasal spray Place 2 sprays into both nostrils every 8 (eight) hours.     ipratropium-albuterol  (DUONEB) 0.5-2.5 (3) MG/3ML SOLN Take 3 mLs by nebulization every 6 (six) hours as needed. 360 mL 11   meclizine  (ANTIVERT ) 25 MG tablet Take 0.5 tablets (12.5 mg total) by mouth 3 (three) times daily as needed for dizziness.     nitroGLYCERIN  (NITROSTAT ) 0.4 MG SL tablet Place 1 tablet (0.4 mg total) under the tongue every 5 (five) minutes x 3 doses as needed for chest pain. 30 tablet 3   omeprazole  (PRILOSEC) 40 MG capsule Take 1 capsule (40 mg total) by mouth daily. 90 capsule 2   potassium chloride  SA (KLOR-CON  M) 20 MEQ tablet Take 1 tablet (20 mEq total) by mouth every other day. AM 45 tablet 3   rivaroxaban  (XARELTO ) 20 MG TABS tablet Take 1 tablet (20 mg total) by mouth daily with supper. 90 tablet 3   rosuvastatin  (CRESTOR ) 20 MG tablet Take 1 tablet (20 mg total) by mouth daily. 90 tablet 3   tirzepatide  (MOUNJARO ) 12.5 MG/0.5ML Pen Inject 12.5 mg into the skin once a week.     Torsemide  60 MG TABS Take 60 mg by mouth daily. 90 tablet 0   No current facility-administered medications for this visit.   Vitals:   07/20/24 0823  BP: 136/74  Pulse: 90  SpO2: 93%  Weight: 259 lb 6.4 oz (117.7 kg)   Wt Readings from Last 3 Encounters:  07/20/24 259 lb 6.4 oz (117.7 kg)  07/14/24 253 lb 12 oz (115.1 kg)  06/28/24 263 lb (119.3 kg)   Lab Results  Component Value Date   CREATININE 1.76 (H)  07/14/2024   CREATININE 1.63 (H) 07/13/2024   CREATININE 2.18 (H) 07/12/2024     PHYSICAL EXAM:  General: Well appearing.  Cor: No JVD. Regular rhythm, rate.  Lungs: clear Abdomen: soft, nontender, distended. Extremities: no edema Neuro:. Affect pleasant   ECG: NSR, HR 91 (personally reviewed)   ASSESSMENT & PLAN:  1.  Chronic HFrEF/Mixed Ischemic and nonischemic cardiomyopathy:   - Mixed ICM/NICM - NYHA class III  - remains fluid up although he feels like his symptoms are a little better since discharge. Will schedule him for IV lasix  tomorrow and then repeat next week. Will ask HF pharm to check and see if nasal embumyst would be covered with his insurance. BMET, proBNP with each IV lasix  dose.  - s/p [employer] Sq-ICD; denies any shocks - weight down 4 pounds from last visit here 3 weeks ago - Echo 05/21/21: EF of 40-45% along  with mild LVH and mild MR.  - Echo 11/24/21: EF of 20-25% along with mild LVH and mild MR.  - Echo 02/06/22: EF of 30-35% along with mild/moderate AR.  - Echo 08/14/22: EF 40-45% with mild LVH, Grade I DD, severe hypokinesis of left ventricular, entire inferior and inferolateral wall.  - Echo 03/08/23: EF 45-50% with moderate LVH, trivial MR, mild dilatation of aortic root at 43 mm - Echo 10/1823: EF 35-40%, global LV hypokinesis, severely dilated LV internal cavity size, mild LVH, small pericardial effusion, mild MR. - Echo 01/02/24: EF 35-40%, mild LVH, low normal RV, mild LAE, mild/ moderate AR, mild dilatation of the aortic root, measuring 40 mm  - RHC 06/23/24: Mildly elevated filling pressures with moderate to severely reduced CO - Echo 07/13/24: EF 35-40%, G1DD, normal RV.  - RHC 07/13/24: Mildly elevated left and right heart filling pressures (PCWP 14 mmHg, mean RA 7 mmHg). Mild-moderate pulmonary hypertension (PA 48/24, mean 32 mmHg). Mildly-moderately reduced cardiac output on milrinone  0.125 mcg/kg's per minute (Fick CO/CI 4.9 L/min, 2.1 L/min/m;  thermodilution CO/CI 4.7 L/min, 2.0 L/min/m). To consider weekly IV lasix  in outpatient setting.  - referred to cardiac rehab last visit - continue carvedilol  3.125mg  BID - continue Farxiga  10mg  daily - continue digoxin  0.0625mg  daily. Had already taken digoxin  this morning. Will check dig level tomorrow when getting IV lasix . Patient instructed to not take digoxin  tomorrow. Tolerated digoxin  during admission so unclear if his symptoms of feeling funny are related to digoxin  use or not.  - continue torsemide  60mg  BID/ potassium 20meq QOD.  - would like to get him back on entresto  but will get lab work back tomorrow first. If start this back, would stop hydralazine .  - continue to use Furoscix  if weight >= 250 - now that he has new insurance, will see if cardiomems can get approved.  - saw [facility] provider (Bensimhon) 09/24 - proBNP 07/09/24 was 143.0. It was 2488 on 07/06/24   2.  CAD/Elevated Troponin and chronic CP:  - H/o CAD  - RHC/ LHC 08/19/23:     1. Relatively stable appearance of the coronary arteries since last catheterization in 2021.  Chronically occluded proximal RCA now has antegrade flow, though I suspect this represents bridging collaterals.  Left-to-right collateral remain as well as moderate LAD and LCx disease.    2. Mildly elevated left heart filling pressures (LVEDP 20 mmHg, PCWP 18 mmHg).    3. Moderate pulmonary hypertension (mean PA 41 mmHg, PVR 5.3 WU).    4. Moderately reduced Fick cardiac output/index (CO 4.3 L/min, CI 1.9 L/min/m^2). - continue rosuvastatin  20mg  daily - continue zetia  10mg  daily - saw cardiology Florestine) 07/25 - lipid panel 01/01/24: LDL 94, triglycerides 341 - insurance is currently not covering repatha  injection   3.  HTN:  - BP 136/74 - saw PCP Gabriel [name]) 09/25 - BMP 07/14/24 reviewed: sodium 138, potassium 3.4, creatinine 1.76 & GFR 45 - BMET tomorrow with IV lasix    4.  H/o PE/Chronic anticoagulation:  - continue xarelto  20mg  daily - no  recent bleeding - HG 07/13/24 was 16.7   5.  DM2- - saw nephrology (Gabriel [name]) 08/23  - A1c 04/02/24 reviewed and was 8.8% - saw endocrinology Gabriel [name]) 09/25   6. OSA- - Sleep study 3/22: AHI 28  - sleep study 12/23; AHI 45.1 - sleep study 11/24 AHI 70.0 - has not worn bipap in ~ 1 year as he says that he feels like he's choking. He says that he tolerated  CPAP better.  - saw pulmonology Gabriel [name]) 11/24; returns 01/26  7: AF- - saw EP Gabriel [name]) 10/25 - cardioverted 11/06/23. Plan for possible ablation in the future as previous ablation was cancelled due to clinical instability.  - continue amiodarone  200mg  daily - continue xarelto  20mg  daily   Return pending IV lasix  response/ nasal bumex  approval.   I spent 38 minutes reviewing records, interviewing/ examing patient and managing plan/ orders.   Ellouise DELENA Class, FNP 07/19/24

## 2024-07-20 ENCOUNTER — Ambulatory Visit: Attending: Family | Admitting: Family

## 2024-07-20 ENCOUNTER — Other Ambulatory Visit: Payer: Self-pay | Admitting: Family

## 2024-07-20 ENCOUNTER — Other Ambulatory Visit (HOSPITAL_COMMUNITY): Payer: Self-pay

## 2024-07-20 ENCOUNTER — Telehealth: Payer: Self-pay

## 2024-07-20 ENCOUNTER — Encounter: Payer: Self-pay | Admitting: Family

## 2024-07-20 VITALS — BP 136/74 | HR 90 | Wt 259.4 lb

## 2024-07-20 DIAGNOSIS — I10 Essential (primary) hypertension: Secondary | ICD-10-CM

## 2024-07-20 DIAGNOSIS — Z7901 Long term (current) use of anticoagulants: Secondary | ICD-10-CM | POA: Insufficient documentation

## 2024-07-20 DIAGNOSIS — K7460 Unspecified cirrhosis of liver: Secondary | ICD-10-CM | POA: Insufficient documentation

## 2024-07-20 DIAGNOSIS — Z95810 Presence of automatic (implantable) cardiac defibrillator: Secondary | ICD-10-CM | POA: Insufficient documentation

## 2024-07-20 DIAGNOSIS — I482 Chronic atrial fibrillation, unspecified: Secondary | ICD-10-CM | POA: Insufficient documentation

## 2024-07-20 DIAGNOSIS — I480 Paroxysmal atrial fibrillation: Secondary | ICD-10-CM

## 2024-07-20 DIAGNOSIS — I2699 Other pulmonary embolism without acute cor pulmonale: Secondary | ICD-10-CM | POA: Diagnosis not present

## 2024-07-20 DIAGNOSIS — Z7984 Long term (current) use of oral hypoglycemic drugs: Secondary | ICD-10-CM | POA: Insufficient documentation

## 2024-07-20 DIAGNOSIS — G8929 Other chronic pain: Secondary | ICD-10-CM | POA: Insufficient documentation

## 2024-07-20 DIAGNOSIS — Z7985 Long-term (current) use of injectable non-insulin antidiabetic drugs: Secondary | ICD-10-CM | POA: Insufficient documentation

## 2024-07-20 DIAGNOSIS — I472 Ventricular tachycardia, unspecified: Secondary | ICD-10-CM | POA: Insufficient documentation

## 2024-07-20 DIAGNOSIS — I25118 Atherosclerotic heart disease of native coronary artery with other forms of angina pectoris: Secondary | ICD-10-CM

## 2024-07-20 DIAGNOSIS — J4489 Other specified chronic obstructive pulmonary disease: Secondary | ICD-10-CM | POA: Insufficient documentation

## 2024-07-20 DIAGNOSIS — I272 Pulmonary hypertension, unspecified: Secondary | ICD-10-CM | POA: Insufficient documentation

## 2024-07-20 DIAGNOSIS — Z87891 Personal history of nicotine dependence: Secondary | ICD-10-CM | POA: Insufficient documentation

## 2024-07-20 DIAGNOSIS — Z794 Long term (current) use of insulin: Secondary | ICD-10-CM | POA: Insufficient documentation

## 2024-07-20 DIAGNOSIS — I255 Ischemic cardiomyopathy: Secondary | ICD-10-CM | POA: Insufficient documentation

## 2024-07-20 DIAGNOSIS — R140 Abdominal distension (gaseous): Secondary | ICD-10-CM | POA: Insufficient documentation

## 2024-07-20 DIAGNOSIS — L409 Psoriasis, unspecified: Secondary | ICD-10-CM | POA: Insufficient documentation

## 2024-07-20 DIAGNOSIS — N1832 Chronic kidney disease, stage 3b: Secondary | ICD-10-CM

## 2024-07-20 DIAGNOSIS — I130 Hypertensive heart and chronic kidney disease with heart failure and stage 1 through stage 4 chronic kidney disease, or unspecified chronic kidney disease: Secondary | ICD-10-CM | POA: Insufficient documentation

## 2024-07-20 DIAGNOSIS — E1122 Type 2 diabetes mellitus with diabetic chronic kidney disease: Secondary | ICD-10-CM | POA: Insufficient documentation

## 2024-07-20 DIAGNOSIS — I5022 Chronic systolic (congestive) heart failure: Secondary | ICD-10-CM

## 2024-07-20 DIAGNOSIS — G43909 Migraine, unspecified, not intractable, without status migrainosus: Secondary | ICD-10-CM | POA: Insufficient documentation

## 2024-07-20 DIAGNOSIS — Z86711 Personal history of pulmonary embolism: Secondary | ICD-10-CM | POA: Insufficient documentation

## 2024-07-20 DIAGNOSIS — Z79899 Other long term (current) drug therapy: Secondary | ICD-10-CM | POA: Insufficient documentation

## 2024-07-20 DIAGNOSIS — I428 Other cardiomyopathies: Secondary | ICD-10-CM | POA: Insufficient documentation

## 2024-07-20 DIAGNOSIS — G4733 Obstructive sleep apnea (adult) (pediatric): Secondary | ICD-10-CM | POA: Insufficient documentation

## 2024-07-20 DIAGNOSIS — I2510 Atherosclerotic heart disease of native coronary artery without angina pectoris: Secondary | ICD-10-CM | POA: Insufficient documentation

## 2024-07-20 DIAGNOSIS — N189 Chronic kidney disease, unspecified: Secondary | ICD-10-CM | POA: Insufficient documentation

## 2024-07-20 DIAGNOSIS — R7989 Other specified abnormal findings of blood chemistry: Secondary | ICD-10-CM | POA: Insufficient documentation

## 2024-07-20 NOTE — Telephone Encounter (Signed)
 Advanced Heart Failure Patient Advocate Encounter  Test billing for this patient's current coverage ([employer] MPD) returns a $12.15 copay for 30 day supply of Enbumyst.  This test claim was processed through [employer]- copay amounts may vary at other pharmacies due to [employer], or as the patient moves through the different stages of their insurance plan.  Rachel DEL, CPhT Rx Patient Advocate Phone: [phone]

## 2024-07-20 NOTE — Patient Instructions (Addendum)
 Return to the [facility] (Main Entrance) tomorrow for IV lasix  at 9 AM   Return again for another dose of IV lasix  next Monday, 07/26/24 at 10 AM  Hold Digoxin  tomorrow for dig level  We will be in contact with you later this week with further information regarding Cardiomems.

## 2024-07-20 NOTE — Progress Notes (Signed)
 SABRA

## 2024-07-21 ENCOUNTER — Encounter: Payer: Self-pay | Admitting: Oncology

## 2024-07-21 ENCOUNTER — Other Ambulatory Visit: Payer: Self-pay | Admitting: Family

## 2024-07-21 ENCOUNTER — Other Ambulatory Visit
Admission: RE | Admit: 2024-07-21 | Discharge: 2024-07-21 | Disposition: A | Source: Ambulatory Visit | Attending: Family | Admitting: Family

## 2024-07-21 ENCOUNTER — Ambulatory Visit: Admission: RE | Admit: 2024-07-21 | Discharge: 2024-07-21 | Attending: Family

## 2024-07-21 ENCOUNTER — Ambulatory Visit: Payer: Self-pay | Admitting: Family

## 2024-07-21 DIAGNOSIS — I5022 Chronic systolic (congestive) heart failure: Secondary | ICD-10-CM

## 2024-07-21 LAB — BASIC METABOLIC PANEL WITH GFR
Anion gap: 13 (ref 5–15)
BUN: 33 mg/dL — ABNORMAL HIGH (ref 6–20)
CO2: 34 mmol/L — ABNORMAL HIGH (ref 22–32)
Calcium: 10.3 mg/dL (ref 8.9–10.3)
Chloride: 95 mmol/L — ABNORMAL LOW (ref 98–111)
Creatinine, Ser: 1.7 mg/dL — ABNORMAL HIGH (ref 0.61–1.24)
GFR, Estimated: 47 mL/min — ABNORMAL LOW (ref >60)
Glucose, Bld: 277 mg/dL — ABNORMAL HIGH (ref 70–99)
Potassium: 3.3 mmol/L — ABNORMAL LOW (ref 3.5–5.1)
Sodium: 141 mmol/L (ref 135–145)

## 2024-07-21 LAB — PRO BRAIN NATRIURETIC PEPTIDE: Pro Brain Natriuretic Peptide: 393 pg/mL — ABNORMAL HIGH (ref <300.0)

## 2024-07-21 LAB — DIGOXIN LEVEL: Digoxin Level: <0.6 ng/mL — ABNORMAL LOW (ref 0.8–2.0)

## 2024-07-21 MED ORDER — POTASSIUM CHLORIDE CRYS ER 20 MEQ PO TBCR
40.0000 meq | EXTENDED_RELEASE_TABLET | Freq: Once | ORAL | Status: AC
  Administered 2024-07-21: 40 meq via ORAL

## 2024-07-21 MED ORDER — FUROSEMIDE 10 MG/ML IJ SOLN
80.0000 mg | Freq: Once | INTRAMUSCULAR | Status: AC
  Administered 2024-07-21: 80 mg via INTRAVENOUS

## 2024-07-21 MED ORDER — POTASSIUM CHLORIDE CRYS ER 20 MEQ PO TBCR
EXTENDED_RELEASE_TABLET | ORAL | Status: AC
  Filled 2024-07-21: qty 2

## 2024-07-21 MED ORDER — FUROSEMIDE 10 MG/ML IJ SOLN
INTRAMUSCULAR | Status: AC
  Filled 2024-07-21: qty 8

## 2024-07-23 MED ORDER — POTASSIUM CHLORIDE CRYS ER 20 MEQ PO TBCR: 20.0000 meq | EXTENDED_RELEASE_TABLET | Freq: Every day | ORAL | 3 refills | Status: AC

## 2024-07-23 NOTE — Telephone Encounter (Signed)
 Called pt to relay change in medication and lab orders. Made follow up appt. Made pt aware of new medication but that it is in process. No further questions at this time.

## 2024-07-26 ENCOUNTER — Other Ambulatory Visit: Payer: Self-pay

## 2024-07-26 ENCOUNTER — Encounter: Attending: Internal Medicine

## 2024-07-26 ENCOUNTER — Telehealth: Payer: Self-pay | Admitting: Pharmacist

## 2024-07-26 DIAGNOSIS — I5022 Chronic systolic (congestive) heart failure: Secondary | ICD-10-CM | POA: Insufficient documentation

## 2024-07-26 MED ORDER — ENBUMYST 0.5 MG/0.1ML NA SOLN: 1.0000 mg | Freq: Every day | NASAL | 1 refills | Status: AC | PRN

## 2024-07-26 NOTE — Progress Notes (Signed)
 Virtual Visit completed. Patient informed on EP and RD appointment and 6 Minute walk test. Patient also informed of patient health questionnaires on My Chart. Patient Verbalizes understanding. Visit diagnosis can be found in [facility] 06/28/2024.

## 2024-07-26 NOTE — Telephone Encounter (Signed)
 Enbumyst sent to [facility] per [facility]

## 2024-07-27 ENCOUNTER — Ambulatory Visit
Admission: RE | Admit: 2024-07-27 | Discharge: 2024-07-27 | Disposition: A | Discharge status: Home / Self Care | Source: Ambulatory Visit | Attending: Family | Admitting: Family

## 2024-07-27 ENCOUNTER — Ambulatory Visit: Payer: Self-pay | Admitting: Family

## 2024-07-27 ENCOUNTER — Other Ambulatory Visit: Payer: Self-pay | Admitting: *Deleted

## 2024-07-27 DIAGNOSIS — D751 Secondary polycythemia: Secondary | ICD-10-CM

## 2024-07-27 DIAGNOSIS — I5022 Chronic systolic (congestive) heart failure: Secondary | ICD-10-CM

## 2024-07-27 LAB — BASIC METABOLIC PANEL WITH GFR
Anion gap: 14 (ref 5–15)
BUN: 25 mg/dL — ABNORMAL HIGH (ref 6–20)
CO2: 26 mmol/L (ref 22–32)
Calcium: 9.6 mg/dL (ref 8.9–10.3)
Chloride: 99 mmol/L (ref 98–111)
Creatinine, Ser: 1.27 mg/dL — ABNORMAL HIGH (ref 0.61–1.24)
GFR, Estimated: >60 mL/min (ref >60)
Glucose, Bld: 327 mg/dL — ABNORMAL HIGH (ref 70–99)
Potassium: 3.8 mmol/L (ref 3.5–5.1)
Sodium: 138 mmol/L (ref 135–145)

## 2024-07-27 LAB — PRO BRAIN NATRIURETIC PEPTIDE: Pro Brain Natriuretic Peptide: 1328 pg/mL — ABNORMAL HIGH (ref <300.0)

## 2024-07-27 MED ORDER — FUROSEMIDE 10 MG/ML IJ SOLN
INTRAMUSCULAR | Status: AC
  Filled 2024-07-27: qty 8

## 2024-07-27 MED ORDER — POTASSIUM CHLORIDE CRYS ER 20 MEQ PO TBCR
40.0000 meq | EXTENDED_RELEASE_TABLET | Freq: Once | ORAL | Status: AC
  Administered 2024-07-27: 40 meq via ORAL

## 2024-07-27 MED ORDER — FUROSEMIDE 10 MG/ML IJ SOLN
80.0000 mg | Freq: Once | INTRAMUSCULAR | Status: AC
  Administered 2024-07-27: 80 mg via INTRAVENOUS

## 2024-07-27 MED ORDER — POTASSIUM CHLORIDE CRYS ER 20 MEQ PO TBCR
EXTENDED_RELEASE_TABLET | ORAL | Status: AC
  Filled 2024-07-27: qty 2

## 2024-07-28 ENCOUNTER — Encounter

## 2024-07-28 ENCOUNTER — Ambulatory Visit

## 2024-07-28 ENCOUNTER — Encounter: Payer: Self-pay | Admitting: Oncology

## 2024-07-28 ENCOUNTER — Ambulatory Visit: Admitting: Oncology

## 2024-07-28 ENCOUNTER — Other Ambulatory Visit: Attending: Oncology

## 2024-07-28 VITALS — BP 146/85 | HR 98 | Temp 97.7°F | Resp 18 | Ht 71.0 in | Wt 268.0 lb

## 2024-07-28 VITALS — Ht 72.4 in | Wt 267.0 lb

## 2024-07-28 DIAGNOSIS — I5022 Chronic systolic (congestive) heart failure: Secondary | ICD-10-CM

## 2024-07-28 DIAGNOSIS — Z87891 Personal history of nicotine dependence: Secondary | ICD-10-CM | POA: Insufficient documentation

## 2024-07-28 DIAGNOSIS — D751 Secondary polycythemia: Secondary | ICD-10-CM

## 2024-07-28 DIAGNOSIS — G4730 Sleep apnea, unspecified: Secondary | ICD-10-CM | POA: Insufficient documentation

## 2024-07-28 DIAGNOSIS — I272 Pulmonary hypertension, unspecified: Secondary | ICD-10-CM | POA: Diagnosis not present

## 2024-07-28 DIAGNOSIS — Z86711 Personal history of pulmonary embolism: Secondary | ICD-10-CM | POA: Diagnosis not present

## 2024-07-28 DIAGNOSIS — N289 Disorder of kidney and ureter, unspecified: Secondary | ICD-10-CM | POA: Insufficient documentation

## 2024-07-28 LAB — CBC WITH DIFFERENTIAL/PLATELET
Abs Immature Granulocytes: 0.03 K/uL (ref 0.00–0.07)
Basophils Absolute: 0.1 K/uL (ref 0.0–0.1)
Basophils Relative: 1 %
Eosinophils Absolute: 0.2 K/uL (ref 0.0–0.5)
Eosinophils Relative: 3 %
HCT: 48.9 % (ref 39.0–52.0)
Hemoglobin: 16.3 g/dL (ref 13.0–17.0)
Immature Granulocytes: 0 %
Lymphocytes Relative: 25 %
Lymphs Abs: 1.8 K/uL (ref 0.7–4.0)
MCH: 27.7 pg (ref 26.0–34.0)
MCHC: 33.3 g/dL (ref 30.0–36.0)
MCV: 83 fL (ref 80.0–100.0)
Monocytes Absolute: 0.5 K/uL (ref 0.1–1.0)
Monocytes Relative: 7 %
Neutro Abs: 4.4 K/uL (ref 1.7–7.7)
Neutrophils Relative %: 64 %
Platelets: 165 K/uL (ref 150–400)
RBC: 5.89 MIL/uL — ABNORMAL HIGH (ref 4.22–5.81)
RDW: 14.5 % (ref 11.5–15.5)
WBC: 7 K/uL (ref 4.0–10.5)
nRBC: 0 % (ref 0.0–0.2)

## 2024-07-28 LAB — IRON AND TIBC
Iron: 60 ug/dL (ref 45–182)
Saturation Ratios: 13 % — ABNORMAL LOW (ref 17.9–39.5)
TIBC: 473 ug/dL — ABNORMAL HIGH (ref 250–450)
UIBC: 414 ug/dL

## 2024-07-28 LAB — FERRITIN: Ferritin: 45 ng/mL (ref 24–336)

## 2024-07-28 NOTE — Progress Notes (Signed)
 [facility]  Telephone:([phone] [phone]  ID: Gabriel [name] Gabriel [name]. OB: 02-Apr-1971  MR#: [id number]  RDW#:[id number]  Patient Care Team: Franchot Isaiah LABOR, MD as PCP - General (Family Medicine) Perla Evalene PARAS, MD as PCP - Cardiology (Cardiology) Cindie Ole DASEN, MD as PCP - Electrophysiology (Cardiology) Donette Ellouise LABOR, FNP as Nurse Practitioner (Family Medicine) Perla Evalene PARAS, MD as Consulting Physician (Cardiology) Alana, Sharyle LABOR, RPH-CPP as Pharmacist Riddle, Suzann, NP as Nurse Practitioner (Clinical Cardiac Electrophysiology)  CHIEF COMPLAINT: Secondary polycythemia.  INTERVAL HISTORY: Patient returns to clinic today for repeat laboratory, further evaluation, and consideration of additional phlebotomy.  He was recently admitted to the hospital prior to Thanksgiving with fluid overload and what appears to be a CHF exacerbation.  He feels improved, but not back to baseline. He has no neurologic complaints.  He denies any recent fevers or illnesses.  He has a good appetite and denies weight loss.  He does not complain of weakness or fatigue today.  He has no chest pain, shortness of breath, cough, or hemoptysis.  He denies any nausea, vomiting, constipation, or diarrhea.  He has no urinary complaints.  Patient offers no further specific complaints today.  REVIEW OF SYSTEMS:   Review of Systems  Constitutional: Negative.  Negative for fever, malaise/fatigue and weight loss.  Respiratory: Negative.  Negative for cough, hemoptysis and shortness of breath.   Cardiovascular: Negative.  Negative for chest pain and leg swelling.  Gastrointestinal: Negative.  Negative for abdominal pain.  Genitourinary: Negative.  Negative for dysuria.  Musculoskeletal: Negative.  Negative for back pain and joint pain.  Skin: Negative.  Negative for rash.  Neurological: Negative.  Negative for dizziness, focal weakness, weakness and headaches.   Psychiatric/Behavioral: Negative.  The patient is not nervous/anxious.     As per HPI. Otherwise, a complete review of systems is negative.  PAST MEDICAL HISTORY: Past Medical History:  Diagnosis Date   Anginal pain    Aortic atherosclerosis    Aortic root dilation 11/28/2016   a.) TTE 11/28/2016: 43 mm; b.) TTE 11/23/2019: 38 mm; c.) TTE 08/14/2022: 40 mm; d.) TTE 03/08/2023: 43 mm; e.) TTE 01/02/2024: 40 mm   Arteriosclerosis of right carotid artery    a.) carotid doppler 04/29/2017: <50% RICA   Atrial fibrillation with RVR (HCC)    a.) CHA2DS2VASc = 6 (HFrEF, HTN, CVA x 2, vascular disease, T2DM) as of 01/27/2024; b.) s/p DCCV 11/06/2023 --> 200 J x 1 -->  NSR; c.) rate/rhythm maintained on oral amiodarone  + carvedilol ; chronically anticoagulated with rivaroxaban    CAD (coronary artery disease)    a.) MV 04/2015: low risk; b.) LHC 01/06/2017: minor irregs in LAD/Diag/LCX/OM, RCA 40p/m/d; c.) R/LHC 06/13/2020: 50% dLAD, 30% p-mLCx, 40% OM1, 100% pRCA (L-R collats) - med mgmt; d.) [facility] 08/19/2023: 50% dLAD, 30% p-mLCx, 40% OM1, 100% pRCA (L-R collats), 50% p-mRCA - med mgmt   Cardiomegaly    Cerebellar infarct (HCC)    a.) brain MRI 06/23/2023: tiny chronic infarct within the RIGHT cerebellar hemisphere; unsure of timing of neurological event; no deficits   Chest wall pain, chronic    Chronic 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    Chronic Troponin Elevation    CKD (chronic kidney disease), stage II-III    COPD (chronic obstructive pulmonary disease) (HCC)    CPAP (continuous positive airway pressure) dependence 03/08/2023   Depression    DM (diabetes mellitus), type 2 (HCC)    Dyspnea    GERD (gastroesophageal reflux disease)  Headache    HFrEF (heart failure with reduced ejection fraction) (HCC)    History of 2019 novel coronavirus disease (COVID-19) 03/01/2022   History of kidney stones    History of methicillin resistant staphylococcus aureus (MRSA)    Hyperlipidemia     Hypertension    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 12/2015 noted on tele-->amio;  b. 12/2015 Event monitor: no VT noted.   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  12/31/19: RUL and RLL PEs.   Secondary polycythemia    a.) negative for JAK2 with reflex and BCR-ABL mutations; seeing hematology with etiology felt to be secondary to underlying COPD (quit smoking 04/2020), heart disease, OSAH, and obesity   Stroke [facility])    Syncope    a. 01/2016 - felt to be vasovagal.   Thrombocytopenia     PAST SURGICAL HISTORY: Past Surgical  History:  Procedure Laterality Date   CARDIOVERSION N/A 11/06/2023   Procedure: CARDIOVERSION;  Surgeon: Zenaida Morene PARAS, MD;  Location: [facility] ORS;  Service: Cardiovascular;  Laterality: N/A;   cardioverted 11/06/23     COLONOSCOPY WITH PROPOFOL  N/A 08/01/2022   Procedure: COLONOSCOPY WITH PROPOFOL ;  Surgeon: Unk Corinn Skiff, MD;  Location: [facility] ENDOSCOPY;  Service: Gastroenterology;  Laterality: N/A;   ESOPHAGOGASTRODUODENOSCOPY (EGD) WITH PROPOFOL  N/A 08/01/2022   Procedure: ESOPHAGOGASTRODUODENOSCOPY (EGD) WITH PROPOFOL ;  Surgeon: Unk Corinn Skiff, MD;  Location: [facility] ENDOSCOPY;  Service: Gastroenterology;  Laterality: N/A;   ESOPHAGOGASTRODUODENOSCOPY (EGD) WITH PROPOFOL  N/A 10/16/2022   Procedure: ESOPHAGOGASTRODUODENOSCOPY (EGD) WITH PROPOFOL ;  Surgeon: Unk Corinn Skiff, MD;  Location: [facility] ENDOSCOPY;  Service: Gastroenterology;  Laterality: N/A;   FINGER AMPUTATION Left    Traumatic   LEFT HEART CATH AND CORONARY ANGIOGRAPHY N/A 01/06/2017   Procedure: Left Heart Cath and Coronary Angiography;  Surgeon: Darron Deatrice LABOR, MD;  Location: [facility] INVASIVE CV LAB;  Service: Cardiovascular;  Laterality: N/A;   LESION EXCISION N/A 02/16/2024   Procedure: EXCISION, LESION, SCALP;  Surgeon: Rodolph,  Lucas, MD;  Location: [facility] ORS;  Service: General;  Laterality: N/A;   RIGHT HEART CATH N/A 06/23/2024   Procedure: RIGHT HEART CATH;  Surgeon: Cherrie Toribio SAUNDERS, MD;  Location: [facility] INVASIVE CV LAB;  Service: Cardiovascular;  Laterality: N/A;   RIGHT HEART CATH N/A 07/13/2024   Procedure: RIGHT HEART CATH;  Surgeon: Mady Bruckner, MD;  Location: [facility] INVASIVE CV LAB;  Service: Cardiovascular;  Laterality: N/A;   RIGHT/LEFT HEART CATH AND CORONARY ANGIOGRAPHY N/A 06/13/2020   Procedure: RIGHT/LEFT HEART CATH AND CORONARY ANGIOGRAPHY;  Surgeon: Mady Bruckner, MD;  Location: [facility] INVASIVE CV LAB;  Service: Cardiovascular;  Laterality: N/A;   RIGHT/LEFT HEART CATH AND CORONARY  ANGIOGRAPHY Bilateral 08/19/2023   Procedure: RIGHT/LEFT HEART CATH AND CORONARY ANGIOGRAPHY;  Surgeon: Mady Bruckner, MD;  Location: [facility] INVASIVE CV LAB;  Service: Cardiovascular;  Laterality: Bilateral;   SHOULDER ARTHROSCOPY WITH SUBACROMIAL DECOMPRESSION, ROTATOR CUFF REPAIR AND BICEP TENDON REPAIR Left 09/16/2023   Procedure: SHOULDER ARTHROSCOPY WITH DEBRIDEMENT, DECOMPRESSION, ROTATOR CUFF REPAIR AND BICEPS TENODESIS.;  Surgeon: [name] Norleen PARAS, MD;  Location: [facility] ORS;  Service: Orthopedics;  Laterality: Left;   SUBQ ICD IMPLANT N/A 05/20/2022   Procedure: SUBQ ICD IMPLANT;  Surgeon: Cindie Ole DASEN, MD;  Location: [facility] INVASIVE CV LAB;  Service: Cardiovascular;  Laterality: N/A;    FAMILY HISTORY: Family History  Problem Relation Age of Onset   Diabetes Mother    Diabetes Mellitus II Mother    Hypothyroidism Mother    Hypertension Mother    Kidney failure Mother        Dialysis   Heart attack Mother        78 yo approximately   Hypertension Father    Gout Father    Cancer Maternal Grandfather    Diabetes Maternal Grandfather    Cancer Paternal Aunt     ADVANCED DIRECTIVES (Y/N):  N  HEALTH MAINTENANCE: Social History   Tobacco Use   Smoking status: Former    Current packs/day: 0.00    Average packs/day: 0.5 packs/day for 33.0 years (16.5 ttl pk-yrs)    Types: Cigarettes    Start date: 05/09/1987    Quit date: 05/08/2020    Years since quitting: 4.2   Smokeless tobacco: Former    Quit date: 05/08/2020   Tobacco comments:    Quit Sept 2021  Vaping Use   Vaping status: Former  Substance Use Topics   Alcohol use: Not Currently   Drug use: No     Colonoscopy:  PAP:  Bone density:  Lipid panel:  Allergies  Allergen Reactions   Metformin  And Related Nausea And Vomiting   Ranitidine  Other (See Comments)   Zantac  [Ranitidine  Hcl] Diarrhea and Nausea Only    Night sweats    Current Outpatient Medications  Medication Sig Dispense Refill   Accu-Chek  Softclix Lancets lancets USE UP TO FOUR TIMES DAILY     albuterol  (VENTOLIN  HFA) 108 (90 Base) MCG/ACT inhaler Inhale 2 puffs into the lungs every 4 (four) hours as needed for wheezing or shortness of breath. 8 g 6   allopurinol  (ZYLOPRIM ) 300 MG tablet Take 300 mg by mouth every morning.     amiodarone  (PACERONE ) 200 MG tablet Take 1 tablet (200 mg total) by mouth daily. 90 tablet 3   BD PEN NEEDLE NANO 2ND GEN 32G X 4 MM MISC USE  AS DIRECTED AT BEDTIME 200 each 0   blood glucose meter kit and supplies KIT Dispense based on patient and insurance preference. Use  up to four times daily as directed. 1 each 11   Bumetanide  (ENBUMYST) 0.5 MG/0.1ML SOLN Place 1 mg into the nose daily as needed (for weight gain with shortness of breath and swelling). 24 each 1   carvedilol  (COREG ) 3.125 MG tablet Take 1 tablet (3.125 mg total) by mouth 2 (two) times daily with a meal. 60 tablet 3   Continuous Glucose Sensor (FREESTYLE LIBRE 3 PLUS SENSOR) MISC Place 1 sensor on the skin every 15 days. Use to check glucose continuously 2 each 12   dapagliflozin  propanediol (FARXIGA ) 10 MG TABS tablet Take 1 tablet (10 mg total) by mouth daily. 90 tablet 3   digoxin  62.5 MCG TABS Take 0.0625 mg by mouth daily. 30 tablet 0   ezetimibe  (ZETIA ) 10 MG tablet Take 1 tablet (10 mg total) by mouth daily. 90 tablet 3   hydrALAZINE  (APRESOLINE ) 25 MG tablet Take 1 tablet (25 mg total) by mouth every 8 (eight) hours. 90 tablet 0   insulin  glargine (LANTUS  SOLOSTAR) 100 UNIT/ML Solostar Pen Inject 50 units into skin at bedtime. Decrease by 2 units every 2 days if fasting blood glucose is less than 90. 15 mL 11   insulin  lispro (HUMALOG ) 100 UNIT/ML KwikPen Inject 20 Units into the skin 3 (three) times daily. + CORRECTION BEFORE BREAKFAST/LUNCH AND SUPPER (CORRECTION = 1 UNIT PER 50 MG/DL ABOVE 849); MAX DAILY DOSE 77 UNITS 15 mL 11   Insulin  Pen Needle 32G X 4 MM MISC USE WITH INSULIN  IN THE MORNING, AT NOON, IN THE EVENING AND AT  BEDTIME 400 each 11   ipratropium (ATROVENT ) 0.06 % nasal spray Place 2 sprays into both nostrils every 8 (eight) hours.     ipratropium-albuterol  (DUONEB) 0.5-2.5 (3) MG/3ML SOLN Take 3 mLs by nebulization every 6 (six) hours as needed. 360 mL 11   meclizine  (ANTIVERT ) 25 MG tablet Take 0.5 tablets (12.5 mg total) by mouth 3 (three) times daily as needed for dizziness.     nitroGLYCERIN  (NITROSTAT ) 0.4 MG SL tablet Place 1 tablet (0.4 mg total) under the tongue every 5 (five) minutes x 3 doses as needed for chest pain. 30 tablet 3   omeprazole  (PRILOSEC) 40 MG capsule Take 1 capsule (40 mg total) by mouth daily. 90 capsule 2   potassium chloride  SA (KLOR-CON  M) 20 MEQ tablet Take 1 tablet (20 mEq total) by mouth daily. AM 90 tablet 3   rivaroxaban  (XARELTO ) 20 MG TABS tablet Take 1 tablet (20 mg total) by mouth daily with supper. 90 tablet 3   rosuvastatin  (CRESTOR ) 20 MG tablet Take 1 tablet (20 mg total) by mouth daily. 90 tablet 3   tirzepatide  (MOUNJARO ) 12.5 MG/0.5ML Pen Inject 12.5 mg into the skin once a week.     torsemide  (DEMADEX ) 20 MG tablet Take 60 mg by mouth daily.     Torsemide  60 MG TABS Take 60 mg by mouth daily. 90 tablet 0   No current facility-administered medications for this visit.    OBJECTIVE: Vitals:   07/28/24 1015  BP: (!) 146/85  Pulse: 98  Resp: 18  Temp: 97.7 F (36.5 C)  SpO2: 95%     Body mass index is 37.38 kg/m.    ECOG FS:0 - Asymptomatic  General: Well-developed, well-nourished, no acute distress. Eyes: Pink conjunctiva, anicteric sclera. HEENT: Normocephalic, moist mucous membranes. Lungs: No audible wheezing or coughing. Heart: Regular rate and rhythm. Abdomen: Soft, nontender, no obvious distention. Musculoskeletal: No edema, cyanosis, or clubbing. Neuro: Alert, answering all  questions appropriately. Cranial nerves grossly intact. Skin: No rashes or petechiae noted. Psych: Normal affect.  LAB RESULTS:  Lab Results  Component Value  Date   NA 138 07/27/2024   K 3.8 07/27/2024   CL 99 07/27/2024   CO2 26 07/27/2024   GLUCOSE 327 (H) 07/27/2024   BUN 25 (H) 07/27/2024   CREATININE 1.27 (H) 07/27/2024   CALCIUM  9.6 07/27/2024   PROT 7.7 07/09/2024   ALBUMIN  4.2 07/09/2024   AST 19 07/09/2024   ALT 15 07/09/2024   ALKPHOS 72 07/09/2024   BILITOT 1.0 07/09/2024   GFRNONAA >60 07/27/2024   GFRAA >60 05/14/2020    Lab Results  Component Value Date   WBC 7.0 07/28/2024   NEUTROABS 4.4 07/28/2024   HGB 16.3 07/28/2024   HCT 48.9 07/28/2024   MCV 83.0 07/28/2024   PLT 165 07/28/2024     STUDIES: ECHOCARDIOGRAM COMPLETE Result Date: 07/14/2024    ECHOCARDIOGRAM REPORT   Patient Name:   Gabriel [name]. Date of Exam: 07/13/2024 Medical Rec #:  [id number]           Height:       71.0 in Accession #:    [id number]          Weight:       256.2 lb Date of Birth:  [birth year]           BSA:          2.342 m Patient Age:    53 years            BP:           135/76 mmHg Patient Gender: M                   HR:           87 bpm. Exam Location:  [facility] Procedure: 2D Echo, Cardiac Doppler, Color Doppler and Intracardiac            Opacification Agent (Both Spectral and Color Flow Doppler were            utilized during procedure). Indications:     I50.9 Congestive heart failure  History:         Patient has prior history of Echocardiogram examinations, most                  recent 01/02/2024. Cardiomegaly and CHF, CAD and Previous                  Myocardial Infarction, Defibrillator, COPD and Stroke,                  Arrythmias:Atrial Fibrillation, Signs/Symptoms:Chest Pain and                  Syncope; Risk Factors:Hypertension, Diabetes and Dyslipidemia.                  Pulmonary hypertension. Obstructive sleep apnea-BiPAP.  Sonographer:     Carl Rodgers-Jones RDCS Referring Phys:  [id number] CHRISTOPHER END Diagnosing Phys: [name] MD  Sonographer Comments: Technically difficult study due to poor echo windows. IMPRESSIONS  1.  Left ventricular ejection fraction, by estimation, is 35 to 40%. The left ventricle has mildly decreased function. The left ventricle demonstrates mild global hypokinesis with moderate inferior wall hypokinesis. The left ventricular internal cavity size was mildly dilated. Left ventricular diastolic parameters are consistent with Grade I diastolic dysfunction (impaired relaxation).  2. Right ventricular systolic function is normal.  The right ventricular size is mildly enlarged. Tricuspid regurgitation signal is inadequate for assessing PA pressure.  3. The mitral valve is normal in structure. No evidence of mitral valve regurgitation. No evidence of mitral stenosis.  4. The aortic valve is normal in structure. Aortic valve regurgitation is mild. Aortic valve sclerosis is present, with no evidence of aortic valve stenosis.  5. The inferior vena cava is normal in size with greater than 50% respiratory variability, suggesting right atrial pressure of 3 mmHg. FINDINGS  Left Ventricle: Left ventricular ejection fraction, by estimation, is 35 to 40%. The left ventricle has moderately decreased function. The left ventricle demonstrates global hypokinesis. Definity  contrast agent was given IV to delineate the left ventricular endocardial borders. Strain was performed and the global longitudinal strain is indeterminate. The left ventricular internal cavity size was mildly dilated. There is no left ventricular hypertrophy. Left ventricular diastolic parameters are consistent with Grade I diastolic dysfunction (impaired relaxation). Right Ventricle: The right ventricular size is mildly enlarged. No increase in right ventricular wall thickness. Right ventricular systolic function is normal. Tricuspid regurgitation signal is inadequate for assessing PA pressure. Left Atrium: Left atrial size was normal in size. Right Atrium: Right atrial size was normal in size. Pericardium: There is no evidence of pericardial effusion. Mitral  Valve: The mitral valve is normal in structure. There is mild calcification of the mitral valve leaflet(s). No evidence of mitral valve regurgitation. No evidence of mitral valve stenosis. Tricuspid Valve: The tricuspid valve is normal in structure. Tricuspid valve regurgitation is not demonstrated. No evidence of tricuspid stenosis. Aortic Valve: The aortic valve is normal in structure. Aortic valve regurgitation is mild. Aortic valve sclerosis is present, with no evidence of aortic valve stenosis. Pulmonic Valve: The pulmonic valve was normal in structure. Pulmonic valve regurgitation is not visualized. No evidence of pulmonic stenosis. Aorta: The aortic root is normal in size and structure. Venous: The inferior vena cava is normal in size with greater than 50% respiratory variability, suggesting right atrial pressure of 3 mmHg. IAS/Shunts: No atrial level shunt detected by color flow Doppler. Additional Comments: 3D was performed not requiring image post processing on an independent workstation and was indeterminate.  LEFT VENTRICLE PLAX 2D LVIDd:         6.30 cm   Diastology LVIDs:         4.60 cm   LV e' medial:    5.59 cm/s LV PW:         1.20 cm   LV E/e' medial:  8.8 LV IVS:        1.00 cm   LV e' lateral:   8.74 cm/s LVOT diam:     2.40 cm   LV E/e' lateral: 5.6 LV SV:         57 LV SV Index:   24 LVOT Area:     4.52 cm  RIGHT VENTRICLE RV Basal diam:  4.00 cm RV S prime:     18.93 cm/s TAPSE (M-mode): 2.0 cm LEFT ATRIUM             Index        RIGHT ATRIUM           Index LA diam:        4.90 cm 2.09 cm/m   RA Area:     16.50 cm LA Vol (A2C):   63.9 ml 27.28 ml/m  RA Volume:   50.30 ml  21.47 ml/m LA Vol (A4C):   65.8 ml  28.09 ml/m LA Biplane Vol: 68.1 ml 29.07 ml/m  AORTIC VALVE LVOT Vmax:   78.85 cm/s LVOT Vmean:  49.300 cm/s LVOT VTI:    0.126 m  AORTA Ao Root diam: 3.80 cm Ao Asc diam:  3.60 cm MITRAL VALVE MV Area (PHT): 4.82 cm    SHUNTS MV Decel Time: 157 msec    Systemic VTI:  0.13 m MV E  velocity: 48.90 cm/s  Systemic Diam: 2.40 cm MV A velocity: 50.63 cm/s MV E/A ratio:  0.97 [name] MD Electronically signed by [name] MD Signature Date/Time: 07/14/2024/7:46:07 AM    Final    CARDIAC CATHETERIZATION Result Date: 07/13/2024 Conclusions: Mildly elevated left and right heart filling pressures (PCWP 14 mmHg, mean RA 7 mmHg). Mild-moderate pulmonary hypertension (PA 48/24, mean 32 mmHg). Mildly-moderately reduced cardiac output on milrinone  0.125 mcg/kg's per minute (Fick CO/CI 4.9 L/min, 2.1 L/min/m; thermodilution CO/CI 4.7 L/min, 2.0 L/min/m). Recommendations: Findings discussed with Dr. Rolan (advanced heart failure team).  Recommend continuation of milrinone  0.125 mcg/kg/min for 1 more day with initiation of digoxin  0.0625 mg daily and torsemide  60 mg p.o. daily. Continue current goal-directed medical therapy. Lonni Hanson, MD [facility]  US  EKG SITE RITE Result Date: 07/09/2024 If Site Rite image not attached, placement could not be confirmed due to current cardiac rhythm.  US  ABDOMEN LIMITED RUQ (LIVER/GB) Result Date: 07/06/2024 CLINICAL DATA:  Right upper quadrant pain. EXAM: ULTRASOUND ABDOMEN LIMITED RIGHT UPPER QUADRANT COMPARISON:  Ultrasound 04/07/2024 and CT earlier today. FINDINGS: Gallbladder: No gallstones or wall thickening visualized. No sonographic Murphy sign noted by sonographer. Common bile duct: Diameter: 2.2 mm. Liver: Moderate hazy echotexture compatible with a degree of steatosis. No focal liver mass. Portal vein is patent on color Doppler imaging with normal direction of blood flow towards the liver. Other: No free fluid. IMPRESSION: 1. No acute findings. 2. Moderate hepatic steatosis. Electronically Signed   By: Toribio Agreste M.D.   On: 07/06/2024 10:29   CT Angio Chest/Abd/Pel for Dissection W and/or Wo Contrast Result Date: 07/06/2024 CLINICAL DATA:  Shortness of breath and hypertension. History of prior pulmonary embolism. Upper  abdominal pain. EXAM: CT ANGIOGRAPHY CHEST, ABDOMEN AND PELVIS TECHNIQUE: Non-contrast CT of the chest was initially obtained. Multidetector CT imaging through the chest, abdomen and pelvis was performed using the standard protocol during bolus administration of intravenous contrast. Multiplanar reconstructed images and MIPs were obtained and reviewed to evaluate the vascular anatomy. RADIATION DOSE REDUCTION: This exam was performed according to the departmental dose-optimization program which includes automated exposure control, adjustment of the mA and/or kV according to patient size and/or use of iterative reconstruction technique. CONTRAST:  OMNIPAQUE  IOHEXOL  350 MG/ML SOLN COMPARISON:  CTA chest 11/03/2023, 05/14/2022, CT abdomen/pelvis 03/28/2021 FINDINGS: CTA CHEST FINDINGS Cardiovascular: Borderline stable cardiomegaly. Mild calcified plaque over the left main and 3 vessel coronary arteries. Thoracic aorta is normal caliber without aneurysm or dissection. Normal 3 vessel takeoff from the aortic arch. Pulmonary arterial system is unremarkable. Remaining vascular structures are unremarkable. Mediastinum/Nodes: 1.2 cm right paratracheal lymph node. 1.3 cm precarinal lymph node without significant change 1.6 cm right subcarinal lymph node (previously 1.3 cm). 1.2 cm left thyroid  nodule without significant change. Remaining mediastinal structures are unremarkable. Lungs/Pleura: Lungs are adequately inflated with tiny bilateral pleural effusions right larger than left with associated basilar atelectasis. Several small scattered bilateral calcified granulomas are present. Noncalcified 5 mm nodule over the left upper lobe (image 41) unchanged from September 2023 and therefore considered benign. No  acute lobar consolidation. Airways are normal. Musculoskeletal: No focal abnormality. Review of the MIP images confirms the above findings. CTA ABDOMEN AND PELVIS FINDINGS VASCULAR Aorta: Mild calcified plaque over  the abdominal aorta. Abdominal aorta is normal in caliber without aneurysm or dissection. Celiac: Patent without evidence of aneurysm, dissection, vasculitis or significant stenosis. SMA: Patent without evidence of aneurysm, dissection, vasculitis or significant stenosis. Renals: Both renal arteries are patent without evidence of aneurysm, dissection, vasculitis, fibromuscular dysplasia or significant stenosis. Small accessory left renal artery just superior to the native artery. IMA: Patent without evidence of aneurysm, dissection, vasculitis or significant stenosis. Inflow: Patent without evidence of aneurysm, dissection, vasculitis or significant stenosis. Veins: No obvious venous abnormality within the limitations of this arterial phase study. Review of the MIP images confirms the above findings. NON-VASCULAR Hepatobiliary: Liver, gallbladder and biliary tree are normal. Pancreas: Normal. Spleen: Normal. Adrenals/Urinary Tract: Adrenal glands are normal. Kidneys are normal in size without hydronephrosis or nephrolithiasis. Mild cystic change over the kidneys which is stable. Ureters and bladder are normal. Stomach/Bowel: Mild gastric distension. Small bowel is normal. Appendix is normal. Colon is unremarkable. Lymphatic: No adenopathy. Reproductive: Prostate is unremarkable. Other: No free fluid or focal inflammatory change. Tiny umbilical hernia containing only peritoneal fat. Musculoskeletal: No focal abnormality. Review of the MIP images confirms the above findings. IMPRESSION: 1. No acute findings in the chest, abdomen or pelvis. No evidence of thoracic or abdominal aortic aneurysm or dissection. 2. Tiny bilateral pleural effusions right larger than left with associated basilar atelectasis. 3. Evidence of old granulomatous disease. 4. Mild mediastinal adenopathy likely reactive. 5. Aortic atherosclerosis. Atherosclerotic coronary artery disease. 6. Tiny umbilical hernia containing only peritoneal fat. Stable  small renal cysts. Aortic Atherosclerosis (ICD10-I70.0). Electronically Signed   By: Toribio Agreste M.D.   On: 07/06/2024 08:49   DG Chest Portable 1 View Result Date: 07/06/2024 CLINICAL DATA:  Shortness of breath EXAM: PORTABLE CHEST 1 VIEW COMPARISON:  02/06/2024 FINDINGS: Left chest wall subcutaneous implanted defibrillator is again noted. Stable cardiomediastinal contours. Low lung volumes. No airspace consolidation, pleural effusion or edema. Visualized osseous structures appear intact. IMPRESSION: Low lung volumes. No acute findings. Electronically Signed   By: Waddell Calk M.D.   On: 07/06/2024 07:15    ASSESSMENT: Secondary polycythemia.  PLAN:    Secondary polycythemia: Resolved.  Patient's hematocrit is 48.9 today.  Previously, entire workup was negative for etiology including JAK2 with reflex and BCR-ABL mutations.  Patient is a non-smoker and does not take testosterone  supplementation.  Patient reports he has sleep apnea and has a CPAP machine, but does not tolerate it very well.  He does not require 500 mL of vomiting today.  No intervention is needed.  Return to clinic in 3 months with repeat laboratory, further evaluation, and consideration of additional phlebotomy if necessary.  Cardiac disease: Continue follow-up with cardiology as scheduled. Renal insufficiency: Patient's most recent creatinine has improved to 1.27.   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 Sleep apnea: Continue follow-up with pulmonary as scheduled.   Patient expressed understanding and was in agreement with this plan. He also understands that He can call clinic at any time with any questions, concerns, or complaints.    Evalene JINNY Reusing, MD   07/28/2024 10:17 AM

## 2024-07-28 NOTE — Progress Notes (Signed)
 Pulmonary Individual Treatment Plan  Patient Details  Name: Gabriel [name]. MRN: [id number] Date of Birth: 19-Mar-1971 Referring Provider:   Flowsheet Row Pulmonary Rehab from 07/28/2024 in [facility] Cardiac and Pulmonary Rehab  Referring Provider Cherrie Sieving, MD    Initial Encounter Date:  Flowsheet Row Pulmonary Rehab from 07/28/2024 in [facility] Cardiac and Pulmonary Rehab  Date 07/28/24    Visit Diagnosis: Heart failure, chronic systolic (HCC)  Patient's Home Medications on Admission:  Current Outpatient Medications:    Accu-Chek Softclix Lancets lancets, USE UP TO FOUR TIMES DAILY, Disp: , Rfl:    albuterol  (VENTOLIN  HFA) 108 (90 Base) MCG/ACT inhaler, Inhale 2 puffs into the lungs every 4 (four) hours as needed for wheezing or shortness of breath., Disp: 8 g, Rfl: 6   allopurinol  (ZYLOPRIM ) 300 MG tablet, Take 300 mg by mouth every morning., Disp: , Rfl:    amiodarone  (PACERONE ) 200 MG tablet, Take 1 tablet (200 mg total) by mouth daily., Disp: 90 tablet, Rfl: 3   BD PEN NEEDLE NANO 2ND GEN 32G X 4 MM MISC, USE  AS DIRECTED AT BEDTIME, Disp: 200 each, Rfl: 0   blood glucose meter kit and supplies KIT, Dispense based on patient and insurance preference. Use up to four times daily as directed., Disp: 1 each, Rfl: 11   Bumetanide  (ENBUMYST) 0.5 MG/0.1ML SOLN, Place 1 mg into the nose daily as needed (for weight gain with shortness of breath and swelling)., Disp: 24 each, Rfl: 1   carvedilol  (COREG ) 3.125 MG tablet, Take 1 tablet (3.125 mg total) by mouth 2 (two) times daily with a meal., Disp: 60 tablet, Rfl: 3   Continuous Glucose Sensor (FREESTYLE LIBRE 3 PLUS SENSOR) MISC, Place 1 sensor on the skin every 15 days. Use to check glucose continuously, Disp: 2 each, Rfl: 12   dapagliflozin  propanediol (FARXIGA ) 10 MG TABS tablet, Take 1 tablet (10 mg total) by mouth daily., Disp: 90 tablet, Rfl: 3   digoxin  62.5 MCG TABS, Take 0.0625 mg by mouth daily., Disp: 30 tablet, Rfl: 0    ezetimibe  (ZETIA ) 10 MG tablet, Take 1 tablet (10 mg total) by mouth daily., Disp: 90 tablet, Rfl: 3   hydrALAZINE  (APRESOLINE ) 25 MG tablet, Take 1 tablet (25 mg total) by mouth every 8 (eight) hours., Disp: 90 tablet, Rfl: 0   insulin  glargine (LANTUS  SOLOSTAR) 100 UNIT/ML Solostar Pen, Inject 50 units into skin at bedtime. Decrease by 2 units every 2 days if fasting blood glucose is less than 90., Disp: 15 mL, Rfl: 11   insulin  lispro (HUMALOG ) 100 UNIT/ML KwikPen, Inject 20 Units into the skin 3 (three) times daily. + CORRECTION BEFORE BREAKFAST/LUNCH AND SUPPER (CORRECTION = 1 UNIT PER 50 MG/DL ABOVE 849); MAX DAILY DOSE 77 UNITS, Disp: 15 mL, Rfl: 11   Insulin  Pen Needle 32G X 4 MM MISC, USE WITH INSULIN  IN THE MORNING, AT NOON, IN THE EVENING AND AT BEDTIME, Disp: 400 each, Rfl: 11   ipratropium (ATROVENT ) 0.06 % nasal spray, Place 2 sprays into both nostrils every 8 (eight) hours., Disp: , Rfl:    ipratropium-albuterol  (DUONEB) 0.5-2.5 (3) MG/3ML SOLN, Take 3 mLs by nebulization every 6 (six) hours as needed., Disp: 360 mL, Rfl: 11   meclizine  (ANTIVERT ) 25 MG tablet, Take 0.5 tablets (12.5 mg total) by mouth 3 (three) times daily as needed for dizziness., Disp: , Rfl:    nitroGLYCERIN  (NITROSTAT ) 0.4 MG SL tablet, Place 1 tablet (0.4 mg total) under the tongue every 5 (five)  minutes x 3 doses as needed for chest pain., Disp: 30 tablet, Rfl: 3   omeprazole  (PRILOSEC) 40 MG capsule, Take 1 capsule (40 mg total) by mouth daily., Disp: 90 capsule, Rfl: 2   potassium chloride  SA (KLOR-CON  M) 20 MEQ tablet, Take 1 tablet (20 mEq total) by mouth daily. AM, Disp: 90 tablet, Rfl: 3   rivaroxaban  (XARELTO ) 20 MG TABS tablet, Take 1 tablet (20 mg total) by mouth daily with supper., Disp: 90 tablet, Rfl: 3   rosuvastatin  (CRESTOR ) 20 MG tablet, Take 1 tablet (20 mg total) by mouth daily., Disp: 90 tablet, Rfl: 3   tirzepatide  (MOUNJARO ) 12.5 MG/0.5ML Pen, Inject 12.5 mg into the skin once a week., Disp: ,  Rfl:    torsemide  (DEMADEX ) 20 MG tablet, Take 60 mg by mouth daily., Disp: , Rfl:    Torsemide  60 MG TABS, Take 60 mg by mouth daily., Disp: 90 tablet, Rfl: 0  Past Medical History: Past Medical History:  Diagnosis Date   Anginal pain    Aortic atherosclerosis    Aortic root dilation 11/28/2016   a.) TTE 11/28/2016: 43 mm; b.) TTE 11/23/2019: 38 mm; c.) TTE 08/14/2022: 40 mm; d.) TTE 03/08/2023: 43 mm; e.) TTE 01/02/2024: 40 mm   Arteriosclerosis of right carotid artery    a.) carotid doppler 04/29/2017: <50% RICA   Atrial fibrillation with RVR (HCC)    a.) CHA2DS2VASc = 6 (HFrEF, HTN, CVA x 2, vascular disease, T2DM) as of 01/27/2024; b.) s/p DCCV 11/06/2023 --> 200 J x 1 -->  NSR; c.) rate/rhythm maintained on oral amiodarone  + carvedilol ; chronically anticoagulated with rivaroxaban    CAD (coronary artery disease)    a.) MV 04/2015: low risk; b.) LHC 01/06/2017: minor irregs in LAD/Diag/LCX/OM, RCA 40p/m/d; c.) R/LHC 06/13/2020: 50% dLAD, 30% p-mLCx, 40% OM1, 100% pRCA (L-R collats) - med mgmt; d.) [facility] 08/19/2023: 50% dLAD, 30% p-mLCx, 40% OM1, 100% pRCA (L-R collats), 50% p-mRCA - med mgmt   Cardiomegaly    Cerebellar infarct (HCC)    a.) brain MRI 06/23/2023: tiny chronic infarct within the RIGHT cerebellar hemisphere; unsure of timing of neurological event; no deficits   Chest wall pain, chronic    Chronic 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    Chronic Troponin Elevation    CKD (chronic kidney disease), stage II-III    COPD (chronic obstructive pulmonary disease) (HCC)    CPAP (continuous positive airway pressure) dependence 03/08/2023   Depression    DM (diabetes mellitus), type 2 (HCC)    Dyspnea    GERD (gastroesophageal reflux disease)    Headache    HFrEF (heart failure with reduced ejection fraction) (HCC)    History of 2019 novel coronavirus disease (COVID-19) 03/01/2022   History of kidney stones    History of methicillin resistant staphylococcus aureus (MRSA)     Hyperlipidemia    Hypertension    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 12/2015 noted on tele-->amio;  b. 12/2015 Event monitor: no VT noted.   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  12/31/19: RUL and RLL PEs.   Secondary polycythemia    a.) negative for JAK2 with reflex and BCR-ABL mutations; seeing hematology with etiology felt to be secondary to underlying COPD (quit smoking 04/2020), heart disease, OSAH, and obesity   Stroke [facility])    Syncope    a. 01/2016 - felt to be vasovagal.   Thrombocytopenia     Tobacco  Use: Social History   Tobacco Use  Smoking Status Former   Current packs/day: 0.00   Average packs/day: 0.5 packs/day for 33.0 years (16.5 ttl pk-yrs)   Types: Cigarettes   Start date: 05/09/1987   Quit date: 05/08/2020   Years since quitting: 4.2  Smokeless Tobacco Former   Quit date: 05/08/2020  Tobacco Comments   Quit Sept 2021    Labs: Review Flowsheet  More data exists      Latest Ref Rng & Units 01/01/2024 04/02/2024 06/23/2024 07/12/2024 07/13/2024  Labs for ITP Cardiac and Pulmonary Rehab  Cholestrol 100 - 199 mg/dL 819  820  - - - -  LDL (calc) 0 - 99 mg/dL 94  - - - -  HDL-C >60 mg/dL 29  - - - -  Trlycerides 0 - 149 mg/dL 658  655  - - - -  Hemoglobin A1c 4.0 - 5.6 % 6.9  8.8  - - -  Bicarbonate 20.0 - 28.0 mmol/L - - 29.2  28.0  - 31.6   TCO2 22 - 32 mmol/L - - 31  29  - 33   O2 Saturation % - - 64  59  66.9  65     Details       Multiple values from one day are sorted in reverse-chronological order          Pulmonary Assessment Scores:  Pulmonary Assessment Scores     Row Name 07/28/24 1528         ADL UCSD   ADL Phase Entry     SOB Score total 53     Rest 0     Walk 4     Stairs 4     Bath 5     Dress 2     Shop 3       CAT Score   CAT Score 20       mMRC Score   mMRC Score 4        UCSD: Self-administered rating of dyspnea associated with activities  of daily living (ADLs) 6-point scale (0 = not at all to 5 = maximal or unable to do because of breathlessness)  Scoring Scores range from 0 to 120.  Minimally important difference is 5 units  CAT: CAT can identify the health impairment of COPD patients and is better correlated with disease progression.  CAT has a scoring range of zero to 40. The CAT score is classified into four groups of low (less than 10), medium (10 - 20), high (21-30) and very high (31-40) based on the impact level of disease on health status. A CAT score over 10 suggests significant symptoms.  A worsening CAT score  could be explained by an exacerbation, poor medication adherence, poor inhaler technique, or progression of COPD or comorbid conditions.  CAT MCID is 2 points  mMRC: mMRC (Modified Medical Research Council) Dyspnea Scale is used to assess the degree of baseline functional disability in patients of respiratory disease due to dyspnea. No minimal important difference is established. A decrease in score of 1 point or greater is considered a positive change.   Pulmonary Function Assessment:  Pulmonary Function Assessment - 07/26/24 1417       Breath   Shortness of Breath Yes;Limiting activity          Exercise Target Goals: Exercise Program Goal: Individual exercise prescription set using results from initial 6 min walk test and THRR while considering  patients activity barriers and safety.   Exercise Prescription Goal: Initial exercise prescription builds to 30-45 minutes a day of aerobic activity, 2-3 days per week.  Home exercise guidelines will be given to patient during program as part of exercise prescription that the participant will acknowledge.  Education: Aerobic Exercise: - Group verbal and visual presentation on the components of exercise prescription. Introduces F.I.T.T principle from ACSM for exercise prescriptions.  Reviews F.I.T.T. principles of aerobic exercise including progression. Written material provided at class time. Flowsheet Row Cardiac Rehab from 08/08/2022 in [facility] Cardiac and Pulmonary Rehab  Education need identified 07/01/22    Education: Resistance Exercise: - Group verbal and visual presentation on the components of exercise prescription. Introduces F.I.T.T principle from ACSM for exercise prescriptions  Reviews F.I.T.T. principles of resistance exercise including progression. Written material provided at class time.    Education: Exercise & Equipment Safety: - Individual verbal instruction and demonstration of equipment use and safety with use of the  equipment. Flowsheet Row Pulmonary Rehab from 07/28/2024 in [facility] Cardiac and Pulmonary Rehab  Date 07/28/24  Educator MB  Instruction Review Code 1- Verbalizes Understanding    Education: Exercise Physiology & General Exercise Guidelines: - Group verbal and written instruction with models to review the exercise physiology of the cardiovascular system and associated critical values. Provides general exercise guidelines with specific guidelines to those with heart or lung disease.  Flowsheet Row Pulmonary Rehab from 07/28/2024 in [facility] Cardiac and Pulmonary Rehab  Education need identified 07/28/24    Education: Flexibility, Balance, Mind/Body Relaxation: - Group verbal and visual presentation with interactive activity on the components of exercise prescription. Introduces F.I.T.T principle from ACSM for exercise prescriptions. Reviews F.I.T.T. principles of flexibility and balance exercise training including progression. Also discusses the mind body connection.  Reviews various relaxation techniques to help reduce and manage stress (i.e. Deep breathing, progressive muscle relaxation, and visualization). Balance handout provided to take home. Written material provided at class time.   Activity Barriers & Risk Stratification:  Activity Barriers & Cardiac Risk Stratification - 07/28/24 1517  Activity Barriers & Cardiac Risk Stratification   Activity Barriers Arthritis;Deconditioning;Decreased Ventricular Function;Shortness of Breath          6 Minute Walk:  6 Minute Walk     Row Name 07/28/24 1512         6 Minute Walk   Phase Initial     Distance 900 feet     Walk Time 6 minutes     # of Rest Breaks 0     MPH 1.7     METS 2.8     RPE 17     Perceived Dyspnea  2     VO2 Peak 9.8     Symptoms Yes (comment)     Comments Bilateral Hip Pain 10/10     Resting HR 89 bpm     Resting BP 150/88     Resting Oxygen Saturation  98 %     Exercise Oxygen Saturation  during 6 min  walk 92 %     Max Ex. HR 105 bpm     Max Ex. BP 158/80     2 Minute Post BP 144/80       Interval HR   1 Minute HR 97     2 Minute HR 99     3 Minute HR 97     4 Minute HR 105     5 Minute HR 100     6 Minute HR 98     2 Minute Post HR 92     Interval Heart Rate? Yes       Interval Oxygen   Interval Oxygen? Yes     Baseline Oxygen Saturation % 98 %     1 Minute Oxygen Saturation % 94 %     1 Minute Liters of Oxygen 0 L     2 Minute Oxygen Saturation % 93 %     2 Minute Liters of Oxygen 0 L     3 Minute Oxygen Saturation % 92 %     3 Minute Liters of Oxygen 0 L     4 Minute Oxygen Saturation % 94 %     4 Minute Liters of Oxygen 0 L     5 Minute Oxygen Saturation % 96 %     5 Minute Liters of Oxygen 0 L     6 Minute Oxygen Saturation % 95 %     6 Minute Liters of Oxygen 0 L     2 Minute Post Oxygen Saturation % 96 %     2 Minute Post Liters of Oxygen 0 L       Oxygen Initial Assessment:  Oxygen Initial Assessment - 07/26/24 1416       Home Oxygen   Home Oxygen Device None    Sleep Oxygen Prescription CPAP    Liters per minute 0    Home Exercise Oxygen Prescription None    Home Resting Oxygen Prescription None    Compliance with Home Oxygen Use Yes      Initial 6 min Walk   Oxygen Used None      Program Oxygen Prescription   Program Oxygen Prescription None      Intervention   Short Term Goals To learn and demonstrate proper pursed lip breathing techniques or other breathing techniques. ;To learn and understand importance of maintaining oxygen saturations>88%;To learn and understand importance of monitoring SPO2 with pulse oximeter and demonstrate accurate use of the pulse oximeter.;To learn and exhibit compliance with exercise, home and travel  O2 prescription;To learn and demonstrate proper use of respiratory medications    Long  Term Goals Maintenance of O2 saturations>88%;Verbalizes importance of monitoring SPO2 with pulse oximeter and return  demonstration;Exhibits proper breathing techniques, such as pursed lip breathing or other method taught during program session;Exhibits compliance with exercise, home  and travel O2 prescription;Compliance with respiratory medication;Demonstrates proper use of MDIs          Oxygen Re-Evaluation:   Oxygen Discharge (Final Oxygen Re-Evaluation):   Initial Exercise Prescription:  Initial Exercise Prescription - 07/28/24 1500       Date of Initial Exercise RX and Referring Provider   Date 07/28/24    Referring Provider Cherrie Sieving, MD      Oxygen   Maintain Oxygen Saturation 88% or higher      Treadmill   MPH 1.5    Grade 0    Minutes 15    METs 2.15      Recumbant Bike   Level 2    Watts 15    Minutes 15    METs 2.8      NuStep   Level 2   T4 and T6   SPM 80    Minutes 15    METs 2.8      Arm Ergometer   Level 1    RPM 25    Minutes 15    METs 2.8      T5 Nustep   Level 2    SPM 80    Minutes 15    METs 2.8      Prescription Details   Frequency (times per week) 2    Duration Progress to 30 minutes of continuous aerobic without signs/symptoms of physical distress      Intensity   THRR 40-80% of Max Heartrate 119-149    Ratings of Perceived Exertion 11-13    Perceived Dyspnea 0-4      Progression   Progression Continue to progress workloads to maintain intensity without signs/symptoms of physical distress.      Resistance Training   Training Prescription Yes    Weight 7 lb    Reps 10-15          Perform Capillary Blood Glucose checks as needed.  Exercise Prescription Changes:   Exercise Prescription Changes     Row Name 07/28/24 1500             Response to Exercise   Blood Pressure (Admit) 150/88       Blood Pressure (Exercise) 158/80       Blood Pressure (Exit) 144/80       Heart Rate (Admit) 89 bpm       Heart Rate (Exercise) 105 bpm       Heart Rate (Exit) 92 bpm       Oxygen Saturation (Admit) 98 %       Oxygen  Saturation (Exercise) 92 %       Oxygen Saturation (Exit) 96 %       Rating of Perceived Exertion (Exercise) 17       Perceived Dyspnea (Exercise) 2       Symptoms Bilateral hip pain 10/10       Comments results         Progression   Average METs 2.8          Exercise Comments:   Exercise Goals and Review:   Exercise Goals     Row Name 07/28/24 1525  Exercise Goals   Increase Physical Activity Yes       Intervention Provide advice, education, support and counseling about physical activity/exercise needs.;Develop an individualized exercise prescription for aerobic and resistive training based on initial evaluation findings, risk stratification, comorbidities and participant's personal goals.       Expected Outcomes Short Term: Attend rehab on a regular basis to increase amount of physical activity.;Long Term: Add in home exercise to make exercise part of routine and to increase amount of physical activity.;Long Term: Exercising regularly at least 3-5 days a week.       Increase Strength and Stamina Yes       Intervention Provide advice, education, support and counseling about physical activity/exercise needs.;Develop an individualized exercise prescription for aerobic and resistive training based on initial evaluation findings, risk stratification, comorbidities and participant's personal goals.       Expected Outcomes Short Term: Increase workloads from initial exercise prescription for resistance, speed, and METs.;Short Term: Perform resistance training exercises routinely during rehab and add in resistance training at home;Long Term: Improve cardiorespiratory fitness, muscular endurance and strength as measured by increased METs and functional capacity ( )       Able to understand and use rate of perceived exertion (RPE) scale Yes       Intervention Provide education and explanation on how to use RPE scale       Expected Outcomes Short Term: Able to use RPE  daily in rehab to express subjective intensity level;Long Term:  Able to use RPE to guide intensity level when exercising independently       Able to understand and use Dyspnea scale Yes       Intervention Provide education and explanation on how to use Dyspnea scale       Expected Outcomes Short Term: Able to use Dyspnea scale daily in rehab to express subjective sense of shortness of breath during exertion;Long Term: Able to use Dyspnea scale to guide intensity level when exercising independently       Knowledge and understanding of Target Heart Rate Range (THRR) Yes       Intervention Provide education and explanation of THRR including how the numbers were predicted and where they are located for reference       Expected Outcomes Short Term: Able to state/look up THRR;Short Term: Able to use daily as guideline for intensity in rehab;Long Term: Able to use THRR to govern intensity when exercising independently       Able to check pulse independently Yes       Intervention Provide education and demonstration on how to check pulse in carotid and radial arteries.;Review the importance of being able to check your own pulse for safety during independent exercise       Expected Outcomes Short Term: Able to explain why pulse checking is important during independent exercise;Long Term: Able to check pulse independently and accurately       Understanding of Exercise Prescription Yes       Intervention Provide education, explanation, and written materials on patient's individual exercise prescription       Expected Outcomes Short Term: Able to explain program exercise prescription;Long Term: Able to explain home exercise prescription to exercise independently          Exercise Goals Re-Evaluation :   Discharge Exercise Prescription (Final Exercise Prescription Changes):  Exercise Prescription Changes - 07/28/24 1500       Response to Exercise   Blood Pressure (Admit) 150/88    Blood Pressure  (  Exercise) 158/80    Blood Pressure (Exit) 144/80    Heart Rate (Admit) 89 bpm    Heart Rate (Exercise) 105 bpm    Heart Rate (Exit) 92 bpm    Oxygen Saturation (Admit) 98 %    Oxygen Saturation (Exercise) 92 %    Oxygen Saturation (Exit) 96 %    Rating of Perceived Exertion (Exercise) 17    Perceived Dyspnea (Exercise) 2    Symptoms Bilateral hip pain 10/10    Comments results      Progression   Average METs 2.8          Nutrition:  Target Goals: Understanding of nutrition guidelines, daily intake of sodium 1500mg , cholesterol 200mg , calories 30% from fat and 7% or less from saturated fats, daily to have 5 or more servings of fruits and vegetables.  Education: Nutrition 1 -Group instruction provided by verbal, written material, interactive activities, discussions, models, and posters to present general guidelines for heart healthy nutrition including macronutrients, label reading, and promoting whole foods over processed counterparts. Education serves as pensions consultant of discussion of heart healthy eating for all. Written material provided at class time.     Education: Nutrition 2 -Group instruction provided by verbal, written material, interactive activities, discussions, models, and posters to present general guidelines for heart healthy nutrition including sodium, cholesterol, and saturated fat. Providing guidance of habit forming to improve blood pressure, cholesterol, and body weight. Written material provided at class time.     Biometrics:  Pre Biometrics - 07/28/24 1526       Pre Biometrics   Height 6' 0.4 (1.839 m)    Weight 267 lb (121.1 kg)    Waist Circumference 48.2 inches    Hip Circumference 42.2 inches    Waist to Hip Ratio 1.14 %    BMI (Calculated) 35.81    Single Leg Stand 15.9 seconds           Nutrition Therapy Plan and Nutrition Goals:  Nutrition Therapy & Goals - 07/28/24 1531       Nutrition Therapy   RD appointment deferred Yes       Personal Nutrition Goals   Nutrition Goal RD appointment deferred at this time.      Intervention Plan   Intervention Prescribe, educate and counsel regarding individualized specific dietary modifications aiming towards targeted core components such as weight, hypertension, lipid management, diabetes, heart failure and other comorbidities.    Expected Outcomes Short Term Goal: Understand basic principles of dietary content, such as calories, fat, sodium, cholesterol and nutrients.          Nutrition Assessments:  MEDIFICTS Score Key: >=70 Need to make dietary changes  40-70 Heart Healthy Diet <= 40 Therapeutic Level Cholesterol Diet  Flowsheet Row Cardiac Rehab from 07/01/2022 in [facility] Cardiac and Pulmonary Rehab  Picture Your Plate Total Score on Admission 73   Picture Your Plate Scores: <59 Unhealthy dietary pattern with much room for improvement. 41-50 Dietary pattern unlikely to meet recommendations for good health and room for improvement. 51-60 More healthful dietary pattern, with some room for improvement.  >60 Healthy dietary pattern, although there may be some specific behaviors that could be improved.   Nutrition Goals Re-Evaluation:   Nutrition Goals Discharge (Final Nutrition Goals Re-Evaluation):   Psychosocial: Target Goals: Acknowledge presence or absence of significant depression and/or stress, maximize coping skills, provide positive support system. Participant is able to verbalize types and ability to use techniques and skills needed for reducing stress and depression.  Education: Stress, Anxiety, and Depression - Group verbal and visual presentation to define topics covered.  Reviews how body is impacted by stress, anxiety, and depression.  Also discusses healthy ways to reduce stress and to treat/manage anxiety and depression.  Written material provided at class time.   Education: Sleep Hygiene -Provides group verbal and written instruction about how  sleep can affect your health.  Define sleep hygiene, discuss sleep cycles and impact of sleep habits. Review good sleep hygiene tips.    Initial Review & Psychosocial Screening:  Initial Psych Review & Screening - 07/26/24 1419       Initial Review   Current issues with None Identified      Family Dynamics   Good Support System? Yes    Comments He can look to his dad for support and is now getting health insurance and is less stressed than last time he was in the program.      Barriers   Psychosocial barriers to participate in program The patient should benefit from training in stress management and relaxation.      Screening Interventions   Interventions Encouraged to exercise;To provide support and resources with identified psychosocial needs;Provide feedback about the scores to participant    Expected Outcomes Short Term goal: Utilizing psychosocial counselor, staff and physician to assist with identification of specific Stressors or current issues interfering with healing process. Setting desired goal for each stressor or current issue identified.;Long Term Goal: Stressors or current issues are controlled or eliminated.;Short Term goal: Identification and review with participant of any Quality of Life or Depression concerns found by scoring the questionnaire.;Long Term goal: The participant improves quality of Life and PHQ9 Scores as seen by post scores and/or verbalization of changes          Quality of Life Scores:  Scores of 19 and below usually indicate a poorer quality of life in these areas.  A difference of  2-3 points is a clinically meaningful difference.  A difference of 2-3 points in the total score of the Quality of Life Index has been associated with significant improvement in overall quality of life, self-image, physical symptoms, and general health in studies assessing change in quality of life.  PHQ-9: Review Flowsheet  More data exists      07/28/2024 06/17/2024  05/26/2024 05/06/2024 04/28/2024  Depression screen PHQ 2/9  Decreased Interest 1 1 0 0 0  Down, Depressed, Hopeless 0 0 0 0 0  PHQ - 2 Score 1 1 0 0 0  Altered sleeping 1 - - - 0  Tired, decreased energy 3 - - - 0  Change in appetite 1 - - - 0  Feeling bad or failure about yourself  0 - - - 0  Trouble concentrating 1 - - - 0  Moving slowly or fidgety/restless 0 - - - 0  Suicidal thoughts 0 - - - 0  PHQ-9 Score 7 - - - 0   Difficult doing work/chores Somewhat difficult - - - Not difficult at all    Details       Data saved with a previous flowsheet row definition        Interpretation of Total Score  Total Score Depression Severity:  1-4 = Minimal depression, 5-9 = Mild depression, 10-14 = Moderate depression, 15-19 = Moderately severe depression, 20-27 = Severe depression   Psychosocial Evaluation and Intervention:   Psychosocial Re-Evaluation:   Psychosocial Discharge (Final Psychosocial Re-Evaluation):   Education: Education Goals: Education classes will be  provided on a weekly basis, covering required topics. Participant will state understanding/return demonstration of topics presented.  Learning Barriers/Preferences:  Learning Barriers/Preferences - 07/26/24 1419       Learning Barriers/Preferences   Learning Barriers None    Learning Preferences Skilled Demonstration          General Pulmonary Education Topics:  Infection Prevention: - Provides verbal and written material to individual with discussion of infection control including proper hand washing and proper equipment cleaning during exercise session. Flowsheet Row Pulmonary Rehab from 07/28/2024 in [facility] Cardiac and Pulmonary Rehab  Date 07/28/24  Educator MB  Instruction Review Code 1- Verbalizes Understanding    Falls Prevention: - Provides verbal and written material to individual with discussion of falls prevention and safety. Flowsheet Row Pulmonary Rehab from 07/28/2024 in [facility] Cardiac and  Pulmonary Rehab  Date 07/28/24  Educator MB  Instruction Review Code 1- Verbalizes Understanding    Chronic Lung Disease Review: - Group verbal instruction with posters, models, PowerPoint presentations and videos,  to review new updates, new respiratory medications, new advancements in procedures and treatments. Providing information on websites and 800 numbers for continued self-education. Includes information about supplement oxygen, available portable oxygen systems, continuous and intermittent flow rates, oxygen safety, concentrators, and Medicare reimbursement for oxygen. Explanation of Pulmonary Drugs, including class, frequency, complications, importance of spacers, rinsing mouth after steroid MDI's, and proper cleaning methods for nebulizers. Review of basic lung anatomy and physiology related to function, structure, and complications of lung disease. Review of risk factors. Discussion about methods for diagnosing sleep apnea and types of masks and machines for OSA. Includes a review of the use of types of environmental controls: home humidity, furnaces, filters, dust mite/pet prevention, HEPA vacuums. Discussion about weather changes, air quality and the benefits of nasal washing. Instruction on Warning signs, infection symptoms, calling MD promptly, preventive modes, and value of vaccinations. Review of effective airway clearance, coughing and/or vibration techniques. Emphasizing that all should Create an Action Plan. Written material provided at class time. Flowsheet Row Pulmonary Rehab from 07/28/2024 in [facility] Cardiac and Pulmonary Rehab  Education need identified 07/28/24    AED/CPR: - Group verbal and written instruction with the use of models to demonstrate the basic use of the AED with the basic ABC's of resuscitation.    Tests and Procedures:  - Group verbal and visual presentation and models provide information about basic cardiac anatomy and function. Reviews the testing  methods done to diagnose heart disease and the outcomes of the test results. Describes the treatment choices: Medical Management, Angioplasty, or Coronary Bypass Surgery for treating various heart conditions including Myocardial Infarction, Angina, Valve Disease, and Cardiac Arrhythmias.  Written material provided at class time. Flowsheet Row Cardiac Rehab from 08/08/2022 in [facility] Cardiac and Pulmonary Rehab  Date 07/25/22  Educator SB  Instruction Review Code 1- Verbalizes Understanding    Medication Safety: - Group verbal and visual instruction to review commonly prescribed medications for heart and lung disease. Reviews the medication, class of the drug, and side effects. Includes the steps to properly store meds and maintain the prescription regimen.  Written material given at graduation.   Other: -Provides group and verbal instruction on various topics (see comments)   Knowledge Questionnaire Score:  Knowledge Questionnaire Score - 07/28/24 1531       Knowledge Questionnaire Score   Pre Score 13/18           Core Components/Risk Factors/Patient Goals at Admission:  Personal Goals and Risk Factors  at Admission - 07/28/24 1533       Core Components/Risk Factors/Patient Goals on Admission    Weight Management Yes;Weight Loss    Intervention Weight Management: Develop a combined nutrition and exercise program designed to reach desired caloric intake, while maintaining appropriate intake of nutrient and fiber, sodium and fats, and appropriate energy expenditure required for the weight goal.;Weight Management: Provide education and appropriate resources to help participant work on and attain dietary goals.;Weight Management/Obesity: Establish reasonable short term and long term weight goals.    Admit Weight 267 lb (121.1 kg)    Goal Weight: Short Term 248 lb 8 oz (112.7 kg)    Goal Weight: Long Term 230 lb (104.3 kg)    Expected Outcomes Short Term: Continue to assess and modify  interventions until short term weight is achieved;Weight Loss: Understanding of general recommendations for a balanced deficit meal plan, which promotes 1-2 lb weight loss per week and includes a negative energy balance of [phone] kcal/d;Understanding recommendations for meals to include 15-35% energy as protein, 25-35% energy from fat, 35-60% energy from carbohydrates, less than 200mg  of dietary cholesterol, 20-35 gm of total fiber daily;Understanding of distribution of calorie intake throughout the day with the consumption of 4-5 meals/snacks;Long Term: Adherence to nutrition and physical activity/exercise program aimed toward attainment of established weight goal    Improve shortness of breath with ADL's Yes    Intervention Provide education, individualized exercise plan and daily activity instruction to help decrease symptoms of SOB with activities of daily living.    Expected Outcomes Short Term: Improve cardiorespiratory fitness to achieve a reduction of symptoms when performing ADLs;Long Term: Be able to perform more ADLs without symptoms or delay the onset of symptoms    Diabetes Yes    Intervention Provide education about signs/symptoms and action to take for hypo/hyperglycemia.;Provide education about proper nutrition, including hydration, and aerobic/resistive exercise prescription along with prescribed medications to achieve blood glucose in normal ranges: Fasting glucose 65-99 mg/dL    Expected Outcomes Short Term: Participant verbalizes understanding of the signs/symptoms and immediate care of hyper/hypoglycemia, proper foot care and importance of medication, aerobic/resistive exercise and nutrition plan for blood glucose control.;Long Term: Attainment of HbA1C < 7%.    Heart Failure Yes    Intervention Provide a combined exercise and nutrition program that is supplemented with education, support and counseling about heart failure. Directed toward relieving symptoms such as shortness of  breath, decreased exercise tolerance, and extremity edema.    Expected Outcomes Improve functional capacity of life;Short term: Attendance in program 2-3 days a week with increased exercise capacity. Reported lower sodium intake. Reported increased fruit and vegetable intake. Reports medication compliance.;Short term: Daily weights obtained and reported for increase. Utilizing diuretic protocols set by physician.;Long term: Adoption of self-care skills and reduction of barriers for early signs and symptoms recognition and intervention leading to self-care maintenance.    Hypertension Yes    Intervention Provide education on lifestyle modifcations including regular physical activity/exercise, weight management, moderate sodium restriction and increased consumption of fresh fruit, vegetables, and low fat dairy, alcohol moderation, and smoking cessation.;Monitor prescription use compliance.    Expected Outcomes Short Term: Continued assessment and intervention until BP is < 140/76mm HG in hypertensive participants. < 130/8mm HG in hypertensive participants with diabetes, heart failure or chronic kidney disease.;Long Term: Maintenance of blood pressure at goal levels.    Lipids Yes    Intervention Provide education and support for participant on nutrition & aerobic/resistive exercise along with prescribed  medications to achieve LDL 70mg , HDL >40mg .    Expected Outcomes Short Term: Participant states understanding of desired cholesterol values and is compliant with medications prescribed. Participant is following exercise prescription and nutrition guidelines.;Long Term: Cholesterol controlled with medications as prescribed, with individualized exercise RX and with personalized nutrition plan. Value goals: LDL < 70mg , HDL > 40 mg.          Education:Diabetes - Individual verbal and written instruction to review signs/symptoms of diabetes, desired ranges of glucose level fasting, after meals and with  exercise. Acknowledge that pre and post exercise glucose checks will be done for 3 sessions at entry of program. Flowsheet Row Pulmonary Rehab from 07/28/2024 in [facility] Cardiac and Pulmonary Rehab  Date 07/28/24  Educator MB  Instruction Review Code 1- Verbalizes Understanding    Know Your Numbers and Heart Failure: - Group verbal and visual instruction to discuss disease risk factors for cardiac and pulmonary disease and treatment options.  Reviews associated critical values for Overweight/Obesity, Hypertension, Cholesterol, and Diabetes.  Discusses basics of heart failure: signs/symptoms and treatments.  Introduces Heart Failure Zone chart for action plan for heart failure. Written material provided at class time. Flowsheet Row Cardiac Rehab from 08/08/2022 in [facility] Cardiac and Pulmonary Rehab  Education need identified 07/01/22  Date 08/08/22  Educator SB  Instruction Review Code 1- Verbalizes Understanding    Core Components/Risk Factors/Patient Goals Review:    Core Components/Risk Factors/Patient Goals at Discharge (Final Review):    ITP Comments:  ITP Comments     Row Name 07/26/24 1424 07/28/24 1512         ITP Comments Virtual Visit completed. Patient informed on EP and RD appointment and 6 Minute walk test. Patient also informed of patient health questionnaires on My Chart. Patient Verbalizes understanding. Visit diagnosis can be found in [facility] 06/28/2024. Completed and gym orientation for respiratory care services. Initial ITP created and sent for review to Dr. Fuad Aleskerov, Medical Director.         Comments: Initial ITP

## 2024-07-28 NOTE — Progress Notes (Signed)
No phlebotomy today.

## 2024-07-28 NOTE — Patient Instructions (Signed)
 Patient Instructions  Patient Details  Name: Gabriel [name]. MRN: [id number] Date of Birth: 1970/11/12 Referring Provider:  Cherrie Toribio SAUNDERS, MD  Below are your personal goals for exercise, nutrition, and risk factors. Our goal is to help you stay on track towards obtaining and maintaining these goals. We will be discussing your progress on these goals with you throughout the program.  Initial Exercise Prescription:  Initial Exercise Prescription - 07/28/24 1500       Date of Initial Exercise RX and Referring Provider   Date 07/28/24    Referring Provider Cherrie Toribio, MD      Oxygen   Maintain Oxygen Saturation 88% or higher      Treadmill   MPH 1.5    Grade 0    Minutes 15    METs 2.15      Recumbant Bike   Level 2    Watts 15    Minutes 15    METs 2.8      NuStep   Level 2   T4 and T6   SPM 80    Minutes 15    METs 2.8      Arm Ergometer   Level 1    RPM 25    Minutes 15    METs 2.8      T5 Nustep   Level 2    SPM 80    Minutes 15    METs 2.8      Prescription Details   Frequency (times per week) 2    Duration Progress to 30 minutes of continuous aerobic without signs/symptoms of physical distress      Intensity   THRR 40-80% of Max Heartrate 119-149    Ratings of Perceived Exertion 11-13    Perceived Dyspnea 0-4      Progression   Progression Continue to progress workloads to maintain intensity without signs/symptoms of physical distress.      Resistance Training   Training Prescription Yes    Weight 7 lb    Reps 10-15          Exercise Goals: Frequency: Be able to perform aerobic exercise two to three times per week in program working toward 2-5 days per week of home exercise.  Intensity: Work with a perceived exertion of 11 (fairly light) - 15 (hard) while following your exercise prescription.  We will make changes to your prescription with you as you progress through the program.   Duration: Be able to do 30 to 45 minutes  of continuous aerobic exercise in addition to a 5 minute warm-up and a 5 minute cool-down routine.   Nutrition Goals: Your personal nutrition goals will be established when you do your nutrition analysis with the dietician.  The following are general nutrition guidelines to follow: Cholesterol < 200mg /day Sodium < 1500mg /day Fiber: Men over 50 yrs - 30 grams per day  Personal Goals:  Personal Goals and Risk Factors at Admission - 07/28/24 1533       Core Components/Risk Factors/Patient Goals on Admission    Weight Management Yes;Weight Loss    Intervention Weight Management: Develop a combined nutrition and exercise program designed to reach desired caloric intake, while maintaining appropriate intake of nutrient and fiber, sodium and fats, and appropriate energy expenditure required for the weight goal.;Weight Management: Provide education and appropriate resources to help participant work on and attain dietary goals.;Weight Management/Obesity: Establish reasonable short term and long term weight goals.    Admit Weight 267 lb (121.1 kg)  Goal Weight: Short Term 248 lb 8 oz (112.7 kg)    Goal Weight: Long Term 230 lb (104.3 kg)    Expected Outcomes Short Term: Continue to assess and modify interventions until short term weight is achieved;Weight Loss: Understanding of general recommendations for a balanced deficit meal plan, which promotes 1-2 lb weight loss per week and includes a negative energy balance of [phone] kcal/d;Understanding recommendations for meals to include 15-35% energy as protein, 25-35% energy from fat, 35-60% energy from carbohydrates, less than 200mg  of dietary cholesterol, 20-35 gm of total fiber daily;Understanding of distribution of calorie intake throughout the day with the consumption of 4-5 meals/snacks;Long Term: Adherence to nutrition and physical activity/exercise program aimed toward attainment of established weight goal    Improve shortness of breath with ADL's  Yes    Intervention Provide education, individualized exercise plan and daily activity instruction to help decrease symptoms of SOB with activities of daily living.    Expected Outcomes Short Term: Improve cardiorespiratory fitness to achieve a reduction of symptoms when performing ADLs;Long Term: Be able to perform more ADLs without symptoms or delay the onset of symptoms    Diabetes Yes    Intervention Provide education about signs/symptoms and action to take for hypo/hyperglycemia.;Provide education about proper nutrition, including hydration, and aerobic/resistive exercise prescription along with prescribed medications to achieve blood glucose in normal ranges: Fasting glucose 65-99 mg/dL    Expected Outcomes Short Term: Participant verbalizes understanding of the signs/symptoms and immediate care of hyper/hypoglycemia, proper foot care and importance of medication, aerobic/resistive exercise and nutrition plan for blood glucose control.;Long Term: Attainment of HbA1C < 7%.    Heart Failure Yes    Intervention Provide a combined exercise and nutrition program that is supplemented with education, support and counseling about heart failure. Directed toward relieving symptoms such as shortness of breath, decreased exercise tolerance, and extremity edema.    Expected Outcomes Improve functional capacity of life;Short term: Attendance in program 2-3 days a week with increased exercise capacity. Reported lower sodium intake. Reported increased fruit and vegetable intake. Reports medication compliance.;Short term: Daily weights obtained and reported for increase. Utilizing diuretic protocols set by physician.;Long term: Adoption of self-care skills and reduction of barriers for early signs and symptoms recognition and intervention leading to self-care maintenance.    Hypertension Yes    Intervention Provide education on lifestyle modifcations including regular physical activity/exercise, weight management,  moderate sodium restriction and increased consumption of fresh fruit, vegetables, and low fat dairy, alcohol moderation, and smoking cessation.;Monitor prescription use compliance.    Expected Outcomes Short Term: Continued assessment and intervention until BP is < 140/69mm HG in hypertensive participants. < 130/38mm HG in hypertensive participants with diabetes, heart failure or chronic kidney disease.;Long Term: Maintenance of blood pressure at goal levels.    Lipids Yes    Intervention Provide education and support for participant on nutrition & aerobic/resistive exercise along with prescribed medications to achieve LDL 70mg , HDL >40mg .    Expected Outcomes Short Term: Participant states understanding of desired cholesterol values and is compliant with medications prescribed. Participant is following exercise prescription and nutrition guidelines.;Long Term: Cholesterol controlled with medications as prescribed, with individualized exercise RX and with personalized nutrition plan. Value goals: LDL < 70mg , HDL > 40 mg.          Tobacco Use Initial Evaluation: Social History   Tobacco Use  Smoking Status Former   Current packs/day: 0.00   Average packs/day: 0.5 packs/day for 33.0 years (16.5 ttl pk-yrs)  Types: Cigarettes   Start date: 05/09/1987   Quit date: 05/08/2020   Years since quitting: 4.2  Smokeless Tobacco Former   Quit date: 05/08/2020  Tobacco Comments   Quit Sept 2021    Exercise Goals and Review:  Exercise Goals     Row Name 07/28/24 1525             Exercise Goals   Increase Physical Activity Yes       Intervention Provide advice, education, support and counseling about physical activity/exercise needs.;Develop an individualized exercise prescription for aerobic and resistive training based on initial evaluation findings, risk stratification, comorbidities and participant's personal goals.       Expected Outcomes Short Term: Attend rehab on a regular basis to  increase amount of physical activity.;Long Term: Add in home exercise to make exercise part of routine and to increase amount of physical activity.;Long Term: Exercising regularly at least 3-5 days a week.       Increase Strength and Stamina Yes       Intervention Provide advice, education, support and counseling about physical activity/exercise needs.;Develop an individualized exercise prescription for aerobic and resistive training based on initial evaluation findings, risk stratification, comorbidities and participant's personal goals.       Expected Outcomes Short Term: Increase workloads from initial exercise prescription for resistance, speed, and METs.;Short Term: Perform resistance training exercises routinely during rehab and add in resistance training at home;Long Term: Improve cardiorespiratory fitness, muscular endurance and strength as measured by increased METs and functional capacity ( )       Able to understand and use rate of perceived exertion (RPE) scale Yes       Intervention Provide education and explanation on how to use RPE scale       Expected Outcomes Short Term: Able to use RPE daily in rehab to express subjective intensity level;Long Term:  Able to use RPE to guide intensity level when exercising independently       Able to understand and use Dyspnea scale Yes       Intervention Provide education and explanation on how to use Dyspnea scale       Expected Outcomes Short Term: Able to use Dyspnea scale daily in rehab to express subjective sense of shortness of breath during exertion;Long Term: Able to use Dyspnea scale to guide intensity level when exercising independently       Knowledge and understanding of Target Heart Rate Range (THRR) Yes       Intervention Provide education and explanation of THRR including how the numbers were predicted and where they are located for reference       Expected Outcomes Short Term: Able to state/look up THRR;Short Term: Able to use daily  as guideline for intensity in rehab;Long Term: Able to use THRR to govern intensity when exercising independently       Able to check pulse independently Yes       Intervention Provide education and demonstration on how to check pulse in carotid and radial arteries.;Review the importance of being able to check your own pulse for safety during independent exercise       Expected Outcomes Short Term: Able to explain why pulse checking is important during independent exercise;Long Term: Able to check pulse independently and accurately       Understanding of Exercise Prescription Yes       Intervention Provide education, explanation, and written materials on patient's individual exercise prescription       Expected Outcomes Short  Term: Able to explain program exercise prescription;Long Term: Able to explain home exercise prescription to exercise independently

## 2024-07-29 ENCOUNTER — Ambulatory Visit

## 2024-07-29 VITALS — BP 140/80 | HR 97 | Resp 16 | Ht 71.0 in | Wt 263.0 lb

## 2024-07-29 DIAGNOSIS — E1122 Type 2 diabetes mellitus with diabetic chronic kidney disease: Secondary | ICD-10-CM | POA: Diagnosis not present

## 2024-07-29 DIAGNOSIS — Z794 Long term (current) use of insulin: Secondary | ICD-10-CM | POA: Diagnosis not present

## 2024-07-29 DIAGNOSIS — Z95811 Presence of heart assist device: Secondary | ICD-10-CM

## 2024-07-29 DIAGNOSIS — I480 Paroxysmal atrial fibrillation: Secondary | ICD-10-CM

## 2024-07-29 DIAGNOSIS — G4733 Obstructive sleep apnea (adult) (pediatric): Secondary | ICD-10-CM

## 2024-07-29 DIAGNOSIS — I5022 Chronic systolic (congestive) heart failure: Secondary | ICD-10-CM

## 2024-07-29 DIAGNOSIS — N1831 Chronic kidney disease, stage 3a: Secondary | ICD-10-CM | POA: Diagnosis not present

## 2024-07-29 MED ORDER — TIRZEPATIDE 15 MG/0.5ML ~~LOC~~ SOAJ: 15.0000 mg | SUBCUTANEOUS | 3 refills | Status: AC

## 2024-07-29 MED ORDER — LANTUS SOLOSTAR 100 UNIT/ML ~~LOC~~ SOPN: 55.0000 [IU] | PEN_INJECTOR | Freq: Every day | SUBCUTANEOUS | 11 refills | Status: AC

## 2024-07-29 NOTE — Progress Notes (Unsigned)
 New patient visit   Patient: Gabriel [name].   DOB: 06-24-1971   53 y.o. Male  MRN: [id number] Visit Date: 07/29/2024  Today's healthcare provider: Isaiah DELENA Pepper, MD   Chief Complaint  Patient presents with   Transer of Care    TOC/Meds/ 4 month f/u   Subjective    Gabriel Jaylon Boylen. is a 53 y.o. male who presents today as a new patient to establish care.   Discussed the use of AI scribe software for clinical note transcription with the patient, who gave verbal consent to proceed.  History of Present Illness Gabriel Eisenhardt Gabriel Retz. is a 53 year old male with diabetes who presents for medication management and coordination of care.  He has been experiencing elevated blood glucose levels, with readings reaching above 300 mg/dL, recently decreasing to the 200s. He uses a Libre 3 glucose monitor and reports a fasting blood glucose of 231 mg/dL. His current medication regimen includes Mounjaro  15 mg, insulin  20 units at meals, and 50 units of long-acting insulin  at night. He reports that his pharmacist provided guidance on adjusting his insulin  regimen, and he was recently hospitalized for eight days, during which he did not take Mounjaro . He resumed Mounjaro  after discharge.  He has a history of heart failure managed by a cardiologist and mentions recent medication adjustments, including being taken off Entresto . His blood pressure fluctuates but he generally feels fine regardless of the readings.  He previously used a CPAP machine but discontinued it due to discomfort and ineffectiveness. He experiences episodes of waking up gasping for air and has an upcoming appointment with a pulmonologist.  He has multiple upcoming specialist appointments, including endocrinology, cardiology, pulmonology, gastroenterology, and rheumatology. He manages his appointments using a calendar and appointment cards.  He recently started Medicare in October and is adjusting to the new system. He had a  wellness visit earlier in the year under Medicaid but has not yet completed a Medicare wellness visit.   Past Medical History:  Diagnosis Date   Anginal pain    Aortic atherosclerosis    Aortic root dilation 11/28/2016   a.) TTE 11/28/2016: 43 mm; b.) TTE 11/23/2019: 38 mm; c.) TTE 08/14/2022: 40 mm; d.) TTE 03/08/2023: 43 mm; e.) TTE 01/02/2024: 40 mm   Arteriosclerosis of right carotid artery    a.) carotid doppler 04/29/2017: <50% RICA   Atrial fibrillation with RVR (HCC)    a.) CHA2DS2VASc = 6 (HFrEF, HTN, CVA x 2, vascular disease, T2DM) as of 01/27/2024; b.) s/p DCCV 11/06/2023 --> 200 J x 1 -->  NSR; c.) rate/rhythm maintained on oral amiodarone  + carvedilol ; chronically anticoagulated with rivaroxaban    CAD (coronary artery disease)    a.) MV 04/2015: low risk; b.) LHC 01/06/2017: minor irregs in LAD/Diag/LCX/OM, RCA 40p/m/d; c.) R/LHC 06/13/2020: 50% dLAD, 30% p-mLCx, 40% OM1, 100% pRCA (L-R collats) - med mgmt; d.) [facility] 08/19/2023: 50% dLAD, 30% p-mLCx, 40% OM1, 100% pRCA (L-R collats), 50% p-mRCA - med mgmt   Cardiomegaly    Cerebellar infarct (HCC)    a.) brain MRI 06/23/2023: tiny chronic infarct within the RIGHT cerebellar hemisphere; unsure of timing of neurological event; no deficits   Chest wall pain, chronic    Chronic 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    Chronic Troponin Elevation    CKD (chronic kidney disease), stage II-III    COPD (chronic obstructive pulmonary disease) (HCC)    CPAP (continuous positive airway pressure) dependence 03/08/2023   Depression  DM (diabetes mellitus), type 2 (HCC)    Dyspnea    GERD (gastroesophageal reflux disease)    Headache    HFrEF (heart failure with reduced ejection fraction) (HCC)    History of 2019 novel coronavirus disease (COVID-19) 03/01/2022   History of kidney stones    History of methicillin resistant staphylococcus aureus (MRSA)    Hyperlipidemia    Hypertension    ICD (implantable cardioverter-defibrillator) in  place 05/20/2022   a.) LEFT midaxillary [employer] Emblem MRI S-ICD (SN: [id number])   Left rotator cuff tear    Liver cirrhosis secondary to NASH [facility])    Long term current use of amiodarone     Long term current use of immunosuppressive drug    a.) ixekizumab for psoriasis/psoriatic arthritis diagnosis   Long-term use of aspirin  therapy    Mediastinal lymphadenopathy    Mitral regurgitation    Mild to moderate by October 2021 echocardiogram.   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 12/2015 noted on tele-->amio;  b. 12/2015 Event monitor: no VT noted.   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  12/31/19: RUL and RLL PEs.   Secondary polycythemia    a.) negative for JAK2 with reflex and BCR-ABL mutations; seeing hematology with etiology felt to be secondary to underlying COPD (quit smoking 04/2020), heart disease, OSAH, and obesity   Stroke [facility])    Syncope    a. 01/2016 - felt to be vasovagal.   Thrombocytopenia    Past Surgical History:  Procedure Laterality Date   CARDIOVERSION N/A 11/06/2023   Procedure:  CARDIOVERSION;  Surgeon: Zenaida Morene PARAS, MD;  Location: [facility] ORS;  Service: Cardiovascular;  Laterality: N/A;   cardioverted 11/06/23     COLONOSCOPY WITH PROPOFOL  N/A 08/01/2022   Procedure: COLONOSCOPY WITH PROPOFOL ;  Surgeon: Unk Corinn Skiff, MD;  Location: [facility] ENDOSCOPY;  Service: Gastroenterology;  Laterality: N/A;   ESOPHAGOGASTRODUODENOSCOPY (EGD) WITH PROPOFOL  N/A 08/01/2022   Procedure: ESOPHAGOGASTRODUODENOSCOPY (EGD) WITH PROPOFOL ;  Surgeon: Unk Corinn Skiff, MD;  Location: [facility] ENDOSCOPY;  Service: Gastroenterology;  Laterality: N/A;   ESOPHAGOGASTRODUODENOSCOPY (EGD) WITH PROPOFOL  N/A 10/16/2022   Procedure: ESOPHAGOGASTRODUODENOSCOPY (EGD) WITH PROPOFOL ;  Surgeon: Unk Corinn Skiff, MD;  Location: [facility] ENDOSCOPY;  Service: Gastroenterology;  Laterality: N/A;   FINGER AMPUTATION Left    Traumatic   LEFT HEART CATH AND CORONARY ANGIOGRAPHY N/A 01/06/2017   Procedure: Left Heart Cath and Coronary Angiography;  Surgeon: Darron Deatrice LABOR, MD;  Location: [facility] INVASIVE CV LAB;  Service: Cardiovascular;  Laterality:  N/A;   LESION EXCISION N/A 02/16/2024   Procedure: EXCISION, LESION, SCALP;  Surgeon: Rodolph Romano, MD;  Location: [facility] ORS;  Service: General;  Laterality: N/A;   RIGHT HEART CATH N/A 06/23/2024   Procedure: RIGHT HEART CATH;  Surgeon: Cherrie Toribio SAUNDERS, MD;  Location: [facility]VASIVE CV LAB;  Service: Cardiovascular;  Laterality: N/A;   RIGHT HEART CATH N/A 07/13/2024   Procedure: RIGHT HEART CATH;  Surgeon: Mady Bruckner, MD;  Location: [facility] IN[facility] CV [facility];  Service: Cardiovascular;  Laterality: N/A;   RIGHT/LEFT HEART CATH AND CORONARY ANGIOGRAPHY N/A 06/13/2020   Procedure: RIGHT/LEFT HEART CATH AND CORONARY ANGIOGRAPHY;  Surgeon: Mady Bruckner, MD;  Location: [facility] CV LAB;  Service: Cardiovascular;  Laterality: N/A;   RIGHT/LEFT HEART CATH AND CORONARY ANGIOGRAPHY Bilateral 08/19/2023   Procedure: RIGHT/LEFT HEART CATH AND CORONARY  ANGIOGRAPHY;  Surgeon: Mady Bruckner, MD;  Location: [facility] INV[facility]IVE CV LAB;  Service: Cardiovascular;  Laterality: Bilateral;   SHOULDER ARTHROSCOPY WITH SUBACROMIAL DECOMPRESSION, ROTATOR CUFF REPAIR AND BICEP TENDON REPAIR Left 09/16/2023   Procedure: SHOULDER ARTHROSCOPY WITH DEBRIDEMENT, DECOMPRESSION, ROTATOR CUFF REPAIR AND BICEPS TENODESIS.;  Surgeon: [name] Norleen PARAS, MD;  Location: [facility] OR[facility];  Service: Orthopedics;  Laterality: Left;   SUBQ ICD IMPLANT N/A 05/20/2022   Procedure: SUBQ ICD IMPLANT;  Surgeon: Cindie Ole DASEN, MD;  Location: [facility] INVASIVE CV LAB;  Service: Cardiovascular;  Laterality: N/A;   Family Status  Relation Name Status   Mother  Deceased   Father  Alive   MGF  Deceased   Bruna Aunt  Deceased  No partnership data on file   Family History  Problem Relation Age of Onset   Diabetes Mother    Diabetes Mellitus II Mother    Hypothyroidism Mother    Hypertension Mother    Kidney failure Mother        Dialysis   Heart attack Mother        50 yo approximately   Hypertension Father    Gout Father    Cancer Maternal Grandfather    Diabetes Maternal Grandfather    Cancer Paternal Aunt    Social History   Socioeconomic History   Marital status: Widowed    Spouse name: Not on file   Number of children: 1   Years of education: 56   Highest education level: 12th grade  Occupational History   Occupation: disabled  Tobacco Use   Smoking status: Former    Current packs/day: 0.00    Average packs/day: 0.5 packs/day for 33.0 years (16.5 ttl pk-yrs)    Types: Cigarettes    Start date: 05/09/1987    Quit date: 05/08/2020    Years since quitting: 4.2   Smokeless tobacco: Former    Quit date: 05/08/2020   Tobacco comments:    Quit Sept 2021  Vaping Use   Vaping status: Former  Substance and Sexual Activity   Alcohol use: Not Currently   Drug use: No   Sexual activity: Not Currently  Other Topics Concern   Not on file  Social History Narrative   Lives at  home with wife and his dad's wife.        Works sales/advanced research scientist (life sciences); smoker; cutting; hx of alcoholism [started 13 year]; quit at age of 22 years.    Social Drivers of Health   Tobacco Use: Medium Risk (07/29/2024)   Patient History    Smoking Tobacco Use: Former    Smokeless Tobacco Use: Former    Passive Exposure: Not on file  Financial Resource Strain: Medium Risk (05/06/2024)   Received from [facility]   Overall Financial Resource Strain (CARDIA)    Difficulty of Paying Living Expenses: Somewhat hard  Food Insecurity: No Food Insecurity (07/07/2024)   Epic    Worried About Programme Researcher, Broadcasting/film/video in the Last Year: Never true    Ran Out of Food in the Last Year: Never true  Transportation Needs: No Transportation Needs (07/07/2024)   Epic    Lack of Transportation (Medical): No    Lack of Transportation (Non-Medical): No  Physical Activity: Not on file  Stress: Not on file  Social Connections: Unknown (10/22/2023)   Social Connection and Isolation Panel    Frequency of Communication with Friends and Family: Not on file    Frequency of Social Gatherings with Friends and Family: Patient declined    Attends Religious Services: Patient declined    Active Member of Clubs or Organizations: Patient declined    Attends Banker Meetings: Patient declined    Marital Status: Patient declined  Depression (PHQ2-9): Medium Risk (07/29/2024)   Depression (PHQ2-9)    PHQ-2 Score: 6  Alcohol Screen: Low Risk (09/17/2022)   Alcohol Screen    Last Alcohol Screening Score (AUDIT): 0  Housing: Low Risk  (07/26/2024)   Received from [facility]   Epic    In the last 12 months, was there a time when you were not able to pay the mortgage or rent on time?: No    In the past 12 months, how many times have you moved where you were living?: 0    At any time in the past 12 months, were you homeless or living in a shelter (including now)?: No  Utilities: Not  At Risk (07/07/2024)   Epic    Threatened with loss of utilities: No  Health Literacy: Adequate Health Literacy (04/02/2024)   B1300 Health Literacy    Frequency of need for help with medical instructions: Never   Show/hide medication list[1] Allergies[2]  Reviews of Systems as noted in HPI.      Objective    BP (!) 140/80 (BP Location: Left Arm, Patient Position: Sitting, Cuff Size: Large)   Pulse 97   Resp 16   Ht 5' 11 (1.803 m)   Wt 263 lb (119.3 kg)   SpO2 98%   BMI 36.68 kg/m     Physical Exam Constitutional:      Appearance: Normal appearance.  HENT:     Head: Normocephalic and atraumatic.     Mouth/Throat:     Mouth: Mucous membranes are moist.  Eyes:     Pupils: Pupils are equal, round, and reactive to light.  Pulmonary:     Effort: Pulmonary effort is normal.  Skin:    General: Skin is warm.  Neurological:     General: No focal deficit present.     Mental Status: He is alert.     Depression Screen    07/29/2024    8:18 AM 07/28/2024    3:35 PM 06/17/2024    1:51 PM 05/26/2024    9:36 AM  PHQ 2/9 Scores  PHQ - 2 Score 1 1 1  0  PHQ- 9 Score 6 7     No results found for any visits on 07/29/24.  Assessment & Plan      Problem List Items Addressed This Visit       Cardiovascular and Mediastinum   Chronic systolic CHF (congestive heart failure) (HCC)  Chronic. Last EF ~35%. Follows closely with heart failure clinic.  - Continue medications as prescribed - Follow up with heart failure clinic.=      PAF (paroxysmal atrial fibrillation) (HCC)   Chronic, taking amiodarone  and Xarelto . Follows with cardiology. - Continue medications as prescribed - Follow up with cardiology        Respiratory   OSA on CPAP   Chronic, uncontrolled. Has not been using CPAP due to discomfort. Has upcoming appointment with pulmonology. Recommend patient resume CPAP.        Endocrine   Type 2 diabetes mellitus with diabetic chronic kidney disease (HCC) -  Primary   Relevant Medications   insulin  glargine (LANTUS  SOLOSTAR) 100 UNIT/ML Solostar Pen   tirzepatide  (MOUNJARO ) 15 MG/0.5ML Pen     Other   Presence of heart assist device (HCC)   ICD placed on 05/20/22. Follows with cardiology.      Morbid obesity (HCC)   BMI 36 with co morbidities of CHF, CAD, T2DM, HLD, OSA. Continue Monujaro as prescribed for T2DM.       Relevant Medications   insulin  glargine (LANTUS  SOLOSTAR) 100 UNIT/ML Solostar Pen   tirzepatide  (MOUNJARO ) 15 MG/0.5ML Pen   Assessment & Plan Type 2 diabetes mellitus with stage 3a chronic kidney disease Chronic, uncontrolled. Last A1c of 8.8. Patient with fasting BG in the 200s. Insulin  regimen adjusted due to kidney function affecting insulin  clearance. - Increased Lantus  to 55 units at night. - Continue Mounjaro  at 15 mg (just started this a few days ago) - Follow-up with endocrinology in early January. - Monitor blood glucose levels closely.  General Health Maintenance Vaccinations and screenings are current. - Schedule Medicare annual wellness visit. - Coordinate with nurse for annual wellness check.    Return in about 3 months (around 10/27/2024) for Follow Up.      Isaiah DELENA Pepper, MD  [facility] [phone] (phone) [phone] (fax)     [1]  Outpatient Medications Prior to Visit  Medication Sig   Accu-Chek Softclix Lancets lancets USE UP TO FOUR TIMES DAILY   albuterol  (VENTOLIN  HFA) 108 (90 Base) MCG/ACT inhaler Inhale 2 puffs into the lungs every 4 (four) hours as needed for wheezing or shortness of breath.   allopurinol  (ZYLOPRIM ) 300 MG tablet Take 300 mg by mouth every morning.   amiodarone  (PACERONE ) 200 MG tablet Take 1 tablet (200 mg total) by mouth daily.   BD PEN NEEDLE NANO 2ND GEN 32G X 4 MM MISC USE  AS DIRECTED AT BEDTIME   blood glucose meter kit and supplies KIT Dispense based on patient and insurance preference. Use up to four times daily as  directed.   Bumetanide  (ENBUMYST) 0.5 MG/0.1ML SOLN Place 1 mg into the nose daily as needed (for weight gain with shortness of breath and swelling).   carvedilol  (COREG ) 3.125 MG tablet Take 1 tablet (3.125 mg total) by mouth 2 (two) times daily with a meal.   Continuous Glucose Sensor (FREESTYLE LIBRE 3 PLUS SENSOR) MISC Place 1 sensor on the skin every 15 days. Use to check glucose continuously   dapagliflozin  propanediol (FARXIGA ) 10 MG TABS tablet Take 1 tablet (10 mg total) by mouth daily.   digoxin  62.5 MCG TABS Take 0.0625 mg by mouth daily.   ezetimibe  (ZETIA ) 10 MG tablet Take 1 tablet (10 mg total) by mouth daily.   hydrALAZINE  (APRESOLINE ) 25 MG tablet Take 1 tablet (25 mg total) by mouth every 8 (eight) hours.   insulin   lispro (HUMALOG ) 100 UNIT/ML KwikPen Inject 20 Units into the skin 3 (three) times daily. + CORRECTION BEFORE BREAKFAST/LUNCH AND SUPPER (CORRECTION = 1 UNIT PER 50 MG/DL ABOVE 849); MAX DAILY DOSE 77 UNITS   Insulin  Pen Needle 32G X 4 MM MISC USE WITH INSULIN  IN THE MORNING, AT NOON, IN THE EVENING AND AT BEDTIME   ipratropium (ATROVENT ) 0.06 % nasal spray Place 2 sprays into both nostrils every 8 (eight) hours.   ipratropium-albuterol  (DUONEB) 0.5-2.5 (3) MG/3ML SOLN Take 3 mLs by nebulization every 6 (six) hours as needed.   meclizine  (ANTIVERT ) 25 MG tablet Take 0.5 tablets (12.5 mg total) by mouth 3 (three) times daily as needed for dizziness.   nitroGLYCERIN  (NITROSTAT ) 0.4 MG SL tablet Place 1 tablet (0.4 mg total) under the tongue every 5 (five) minutes x 3 doses as needed for chest pain.   omeprazole  (PRILOSEC) 40 MG capsule Take 1 capsule (40 mg total) by mouth daily.   potassium chloride  SA (KLOR-CON  M) 20 MEQ tablet Take 1 tablet (20 mEq total) by mouth daily. AM   rivaroxaban  (XARELTO ) 20 MG TABS tablet Take 1 tablet (20 mg total) by mouth daily with supper.   rosuvastatin  (CRESTOR ) 20 MG tablet Take 1 tablet (20 mg total) by mouth daily.   torsemide   (DEMADEX ) 20 MG tablet Take 60 mg by mouth daily.   Torsemide  60 MG TABS Take 60 mg by mouth daily.   [DISCONTINUED] insulin  glargine (LANTUS  SOLOSTAR) 100 UNIT/ML Solostar Pen Inject 50 units into skin at bedtime. Decrease by 2 units every 2 days if fasting blood glucose is less than 90.   [DISCONTINUED] tirzepatide  (MOUNJARO ) 12.5 MG/0.5ML Pen Inject 12.5 mg into the skin once a week.   No facility-administered medications prior to visit.  [2]  Allergies Allergen Reactions   Metformin  And Related Nausea And Vomiting   Ranitidine  Other (See Comments)   Zantac  [Ranitidine  Hcl] Diarrhea and Nausea Only    Night sweats

## 2024-07-30 DIAGNOSIS — Z95811 Presence of heart assist device: Secondary | ICD-10-CM | POA: Insufficient documentation

## 2024-07-30 NOTE — Assessment & Plan Note (Signed)
 Chronic. Last EF ~35%. Follows closely with heart failure clinic.  - Continue medications as prescribed - Follow up with heart failure clinic.=

## 2024-07-30 NOTE — Assessment & Plan Note (Signed)
 Chronic, uncontrolled. Has not been using CPAP due to discomfort. Has upcoming appointment with pulmonology. Recommend patient resume CPAP.

## 2024-07-30 NOTE — Assessment & Plan Note (Signed)
 ICD placed on 05/20/22. Follows with cardiology.

## 2024-07-30 NOTE — Assessment & Plan Note (Signed)
 BMI 36 with co morbidities of CHF, CAD, T2DM, HLD, OSA. Continue Monujaro as prescribed for T2DM.

## 2024-07-30 NOTE — Assessment & Plan Note (Signed)
 Chronic, taking amiodarone  and Xarelto . Follows with cardiology. - Continue medications as prescribed - Follow up with cardiology

## 2024-08-03 ENCOUNTER — Encounter

## 2024-08-03 ENCOUNTER — Telehealth: Payer: Self-pay | Admitting: Family

## 2024-08-03 DIAGNOSIS — I5022 Chronic systolic (congestive) heart failure: Secondary | ICD-10-CM | POA: Diagnosis not present

## 2024-08-03 LAB — GLUCOSE, CAPILLARY
Glucose-Capillary: 258 mg/dL — ABNORMAL HIGH (ref 70–99)
Glucose-Capillary: 240 mg/dL — ABNORMAL HIGH (ref 70–99)

## 2024-08-03 NOTE — Progress Notes (Unsigned)
 Advanced Heart Failure Clinic Note    PCP: Ostwalt, Janna, PA  Primary Cardiologist: Gollan, Timothy, MD  HF cardiologist: Cherrie Sieving, MD   Chief Complaint:   HPI:  Mr Sautter is a 53 y/o male with a history of atrial fibrillation (03/25), HTN, T2DM, chronic chest pain, chronic troponin elevation, COPD, CKD, asthma, NSVT, OSA, unprovoked PE, pulmonary HTN, psoriasis, migraines, cirrhosis from NASH, ICD placement (10/23), previous tobacco use and chronic heart failure. Had left shoulder arthroscopy with debridement, decompression, rotator cuff repair and biceps tenodesis done 09/16/23.   RHC/LHC done 06/13/20 and showed: Severe single-vessel coronary artery disease with chronic total occlusion of the proximal RCA.  The distal vessel fills via left to right collaterals. Mild to moderate, nonobstructive CAD involving the LAD and LCx with up to 50% stenosis. Mildly elevated left and right heart filling pressures. Moderate pulmonary hypertension (mean PAP 40 mmHg) with significantly elevated pulmonary vascular resistance (PVR 7.4 WU). Severely reduced Fick cardiac output/index.  Echo 05/21/21: EF of 40-45% along with mild LVH and mild MR.   Admitted 09/2021 & 11/2021 with HF.   Echo 11/24/21: EF of 20-25% along with mild LVH and mild MR.  Echo 02/06/22: EF of 30-35% along with mild/moderate AR.   Admitted 05/20/22 for ICD placement and discharged the next day. Was in the ED 12/21/22 due to dizziness & chest pain.   Echo 08/14/22: EF 40-45% with mild LVH, Grade I DD, severe hypokinesis of left ventricular, entire inferior and inferolateral wall.  Echo 03/08/23: EF 45-50% with moderate LVH, trivial MR, mild dilatation of aortic root at 43 mm  Was in the ED 06/09/23 due to SOB/ chest pain for previous 2 days. CXR concerning for pneumonia so antibiotics provided. Initial troponin in the 50's which is close to his baseline. EKG without ischemic changes.   RHC/ LHC 08/19/23:  Relatively  stable appearance of the coronary arteries since last catheterization in 2021.  Chronically occluded proximal RCA now has antegrade flow, though I suspect this represents bridging collaterals.  Left-to-right collateral remain as well as moderate LAD and LCx disease. Mildly elevated left heart filling pressures (LVEDP 20 mmHg, PCWP 18 mmHg). Moderate pulmonary hypertension (mean PA 41 mmHg, PVR 5.3 WU). Moderately reduced Fick cardiac output/index (CO 4.3 L/min, CI 1.9 L/min/m^2).  Admitted 10/22/23 due to COPD exacerbation secondary to influenza.   Admitted 11/03/23 with dizziness, tachycardia and new onset AF. Echo 3/18 -- EF 35-40%, global LV hypokinesis, severely dilated LV internal cavity size, mild LVH, small pericardial effusion, mild MR. IV diuresed, GDMT restarted. Successful cardioversion 11/06/23. Amiodarone  loaded.   Was seen in the HF clinic 12/05/23 where torsemide  was increased to 40mg  daily X 3 days and then decreased back to 20mg  daily.   Was seen in the HF clinic 12/10/23 & was sent for IV lasix  due to being fluid up. Zio monitor placed due to increased palpitations.   Echo 01/02/24: EF 35-40%, mild LVH, low normal RV, mild LAE, mild/ moderate AR, mild dilatation of the aortic root, measuring 40 mm   Admitted 02/06/24 with SOB and hypoxia for several days prior to admission. CXR showed edema, no effusions. ECG NSR. BNP 600. IV diuresed with transition to oral diuretics.   Seen in [facility] 06/17/24 and due to being fluid up, given 3 doses of metolazone  2.5mg  given and RHC was scheduled.   RHC 06/23/24:  RA = 3 RV = 36/5 PA = 34/15 (24) PCW = 22 Fick cardiac output/index = 4.3/1.8 TD CO/CI =  4.8/2.0 PVR = 0.4 WU Ao sat = 94%  PA sat = 59%, 64% PAPi = 6.3  Assessment: 1. Mildly elevated filling pressures with moderate to severely reduced CO  Admitted 07/06/24 with worsening shortness of breath and abdominal pain. Symptoms started 4-5 days prior to admission, patient started to notice  intermittent stretching like abdominal pain, worsening with torso movement. He also noticed increasing girth of his abdomen and he gained more than 10 pounds in last 7 days. He tried to increase his torsemide  dosage to twice daily with no significant improvement. Yesterday he started develop wheezing and dry cough and increasing shortness of breath and could not lie flat because of shortness of breath. CT chest abdomen showed no acute findings. Cardiology consulted with adjustment in diuretics. Lactic acid elevated and we started milrinone  drip and PICC line placed. Echo 07/13/24: EF 35-40%, G1DD, normal RV. RHC 07/13/24: Mildly elevated left and right heart filling pressures (PCWP 14 mmHg, mean RA 7 mmHg). Mild-moderate pulmonary hypertension (PA 48/24, mean 32 mmHg). Mildly-moderately reduced cardiac output on milrinone  0.125 mcg/kg's per minute (Fick CO/CI 4.9 L/min, 2.1 L/min/m; thermodilution CO/CI 4.7 L/min, 2.0 L/min/m). To consider weekly IV lasix  in outpatient setting.   He presents today for a HF follow-up visit with a chief complaint of  Has received weekly IV lasix  X 2 doses since last here. Enbumyst  is currently unable to be found at pharmacies. Currently participating in pulmonary rehab.   ROS: All systems negative except as listed in HPI, PMH and Problem List.  SH:  Social History   Socioeconomic History   Marital status: Widowed    Spouse name: Not on file   Number of children: 1   Years of education: 63   Highest education level: 12th grade  Occupational History   Occupation: disabled  Tobacco Use   Smoking status: Former    Current packs/day: 0.00    Average packs/day: 0.5 packs/day for 33.0 years (16.5 ttl pk-yrs)    Types: Cigarettes    Start date: 05/09/1987    Quit date: 05/08/2020    Years since quitting: 4.2   Smokeless tobacco: Former    Quit date: 05/08/2020   Tobacco comments:    Quit Sept 2021  Vaping Use   Vaping status: Former  Substance and Sexual  Activity   Alcohol use: Not Currently   Drug use: No   Sexual activity: Not Currently  Other Topics Concern   Not on file  Social History Narrative   Lives at home with wife and his dad's wife.        Works sales/advanced research scientist (life sciences); smoker; cutting; hx of alcoholism [started 13 year]; quit at age of 22 years.    Social Drivers of Health   Tobacco Use: Medium Risk (07/29/2024)   Patient History    Smoking Tobacco Use: Former    Smokeless Tobacco Use: Former    Passive Exposure: Not on Actuary Strain: Medium Risk (05/06/2024)   Received from [facility]   Overall Financial Resource Strain (CARDIA)    Difficulty of Paying Living Expenses: Somewhat hard  Food Insecurity: No Food Insecurity (07/07/2024)   Epic    Worried About Programme Researcher, Broadcasting/film/video in the Last Year: Never true    Ran Out of Food in the Last Year: Never true  Transportation Needs: No Transportation Needs (07/07/2024)   Epic    Lack of Transportation (Medical): No    Lack of Transportation (Non-Medical): No  Physical Activity:  Not on file  Stress: Not on file  Social Connections: Unknown (10/22/2023)   Social Connection and Isolation Panel    Frequency of Communication with Friends and Family: Not on file    Frequency of Social Gatherings with Friends and Family: Patient declined    Attends Religious Services: Patient declined    Active Member of Clubs or Organizations: Patient declined    Attends Banker Meetings: Patient declined    Marital Status: Patient declined  Intimate Partner Violence: Not At Risk (07/07/2024)   Epic    Fear of Current or Ex-Partner: No    Emotionally Abused: No    Physically Abused: No    Sexually Abused: No  Depression (PHQ2-9): Medium Risk (07/29/2024)   Depression (PHQ2-9)    PHQ-2 Score: 6  Alcohol Screen: Low Risk (09/17/2022)   Alcohol Screen    Last Alcohol Screening Score (AUDIT): 0  Housing: Low Risk  (07/26/2024)   Received from  [facility]   Epic    In the last 12 months, was there a time when you were not able to pay the mortgage or rent on time?: No    In the past 12 months, how many times have you moved where you were living?: 0    At any time in the past 12 months, were you homeless or living in a shelter (including now)?: No  Utilities: Not At Risk (07/07/2024)   Epic    Threatened with loss of utilities: No  Health Literacy: Adequate Health Literacy (04/02/2024)   B1300 Health Literacy    Frequency of need for help with medical instructions: Never    FH:  Family History  Problem Relation Age of Onset   Diabetes Mother    Diabetes Mellitus II Mother    Hypothyroidism Mother    Hypertension Mother    Kidney failure Mother        Dialysis   Heart attack Mother        33 yo approximately   Hypertension Father    Gout Father    Cancer Maternal Grandfather    Diabetes Maternal Grandfather    Cancer Paternal Aunt     Past Medical History:  Diagnosis Date   Anginal pain    Aortic atherosclerosis    Aortic root dilation 11/28/2016   a.) TTE 11/28/2016: 43 mm; b.) TTE 11/23/2019: 38 mm; c.) TTE 08/14/2022: 40 mm; d.) TTE 03/08/2023: 43 mm; e.) TTE 01/02/2024: 40 mm   Arteriosclerosis of right carotid artery    a.) carotid doppler 04/29/2017: <50% RICA   Atrial fibrillation with RVR (HCC)    a.) CHA2DS2VASc = 6 (HFrEF, HTN, CVA x 2, vascular disease, T2DM) as of 01/27/2024; b.) s/p DCCV 11/06/2023 --> 200 J x 1 -->  NSR; c.) rate/rhythm maintained on oral amiodarone  + carvedilol ; chronically anticoagulated with rivaroxaban    CAD (coronary artery disease)    a.) MV 04/2015: low risk; b.) LHC 01/06/2017: minor irregs in LAD/Diag/LCX/OM, RCA 40p/m/d; c.) R/LHC 06/13/2020: 50% dLAD, 30% p-mLCx, 40% OM1, 100% pRCA (L-R collats) - med mgmt; d.) [facility] 08/19/2023: 50% dLAD, 30% p-mLCx, 40% OM1, 100% pRCA (L-R collats), 50% p-mRCA - med mgmt   Cardiomegaly    Cerebellar infarct (HCC)    a.)  brain MRI 06/23/2023: tiny chronic infarct within the RIGHT cerebellar hemisphere; unsure of timing of neurological event; no deficits   Chest wall pain, chronic    Chronic 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    Chronic Troponin Elevation  CKD (chronic kidney disease), stage II-III    COPD (chronic obstructive pulmonary disease) (HCC)    CPAP (continuous positive airway pressure) dependence 03/08/2023   Depression    DM (diabetes mellitus), type 2 (HCC)    Dyspnea    GERD (gastroesophageal reflux disease)    Headache    HFrEF (heart failure with reduced ejection fraction) (HCC)    History of 2019 novel coronavirus disease (COVID-19) 03/01/2022   History of kidney stones    History of methicillin resistant staphylococcus aureus (MRSA)    Hyperlipidemia    Hypertension    ICD (implantable cardioverter-defibrillator) in place 05/20/2022   a.) LEFT midaxillary [employer] Emblem MRI S-ICD (SN: [id number])   Left rotator cuff tear    Liver cirrhosis secondary to NASH [facility])    Long term current use of amiodarone     Long term current use of immunosuppressive drug    a.) ixekizumab for psoriasis/psoriatic arthritis diagnosis   Long-term use of aspirin  therapy    Mediastinal lymphadenopathy    Mitral regurgitation    Mild to moderate by October 2021 echocardiogram.   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 12/2015 noted on tele-->amio;  b. 12/2015 Event monitor: no VT noted.   Obesity (BMI 30.0-34.9)    On rivaroxaban  therapy    OSA treated with BiPAP    Pneumonia    Polyp of descending colon    Psoriasis    Psoriatic arthritis (HCC)    a.) Tx'd with ixekizumab   Pulmonary HTN (HCC)  02/29/2016   a.) TTE 02/29/2016: RVSP 47; b.) TTE 06/26/2019: RVSP 58.4; c.) TTE 12/08/2019: RVSP 40.8; d.) TTE 06/08/2020: RVSP 44.7; e.) R/LHC 06/08/2020: mRA 9, mPA 40, mPWCP 17, AO sat 98, PA sat 61, CO 3.1, CI 1.3, PVR 7.4, LVEDP 20; f.) R/LHC 08/18/2020: mRA 5, mPA 41, mPCWP 18, AO sat 92, PA sat 59, CO 4.3, CI 1.9, PVR 5.3   Recurrent pulmonary emboli (HCC) 06/07/2020   06/07/20: small bilateral PEs.  12/31/19: RUL and RLL PEs.   Secondary polycythemia    a.) negative for JAK2 with reflex and BCR-ABL mutations; seeing hematology with etiology felt to be secondary to underlying COPD (quit smoking 04/2020), heart disease, OSAH, and obesity   Stroke [facility])    Syncope    a. 01/2016 - felt to be vasovagal.   Thrombocytopenia     Current Outpatient Medications  Medication Sig Dispense Refill   Accu-Chek Softclix Lancets lancets USE UP TO FOUR TIMES DAILY     albuterol  (VENTOLIN  HFA) 108 (90 Base) MCG/ACT inhaler Inhale 2 puffs into the lungs every 4 (four) hours as needed for wheezing or shortness of breath. 8 g 6   allopurinol  (ZYLOPRIM ) 300 MG tablet Take 300 mg by mouth every morning.     amiodarone  (PACERONE ) 200 MG tablet Take 1 tablet (200 mg total) by mouth daily. 90 tablet 3   BD PEN NEEDLE NANO 2ND GEN 32G X 4 MM MISC USE  AS DIRECTED AT BEDTIME 200 each 0   blood glucose meter kit and supplies KIT Dispense based on patient and insurance preference. Use up to four times daily as directed. 1 each 11   Bumetanide  (ENBUMYST ) 0.5 MG/0.1ML  SOLN Place 1 mg into the nose daily as needed (for weight gain with shortness of breath and swelling). 24 each 1   carvedilol  (COREG ) 3.125 MG tablet Take 1 tablet (3.125 mg total) by mouth 2 (two) times daily with a meal. 60 tablet 3   Continuous Glucose Sensor (FREESTYLE LIBRE 3 PLUS SENSOR) MISC Place 1 sensor on the skin every 15 days. Use to check glucose continuously 2 each 12   dapagliflozin  propanediol (FARXIGA ) 10 MG TABS tablet Take 1 tablet (10  mg total) by mouth daily. 90 tablet 3   digoxin  62.5 MCG TABS Take 0.0625 mg by mouth daily. 30 tablet 0   ezetimibe  (ZETIA ) 10 MG tablet Take 1 tablet (10 mg total) by mouth daily. 90 tablet 3   hydrALAZINE  (APRESOLINE ) 25 MG tablet Take 1 tablet (25 mg total) by mouth every 8 (eight) hours. 90 tablet 0   insulin  glargine (LANTUS  SOLOSTAR) 100 UNIT/ML Solostar Pen Inject 55 Units into the skin daily. Decrease by 2 units every 2 days if fasting blood glucose is less than 90. 15 mL 11   insulin  lispro (HUMALOG ) 100 UNIT/ML KwikPen Inject 20 Units into the skin 3 (three) times daily. + CORRECTION BEFORE BREAKFAST/LUNCH AND SUPPER (CORRECTION = 1 UNIT PER 50 MG/DL ABOVE 849); MAX DAILY DOSE 77 UNITS 15 mL 11   Insulin  Pen Needle 32G X 4 MM MISC USE WITH INSULIN  IN THE MORNING, AT NOON, IN THE EVENING AND AT BEDTIME 400 each 11   ipratropium (ATROVENT ) 0.06 % nasal spray Place 2 sprays into both nostrils every 8 (eight) hours.     ipratropium-albuterol  (DUONEB) 0.5-2.5 (3) MG/3ML SOLN Take 3 mLs by nebulization every 6 (six) hours as needed. 360 mL 11   meclizine  (ANTIVERT ) 25 MG tablet Take 0.5 tablets (12.5 mg total) by mouth 3 (three) times daily as needed for dizziness.     nitroGLYCERIN  (NITROSTAT ) 0.4 MG SL tablet Place 1 tablet (0.4 mg total) under the tongue every 5 (five) minutes x 3 doses as needed for chest pain. 30 tablet 3   omeprazole  (PRILOSEC) 40 MG capsule Take 1 capsule (40 mg total) by mouth daily. 90 capsule 2   potassium chloride  SA (KLOR-CON  M) 20 MEQ tablet Take 1 tablet (20 mEq total) by mouth daily. AM 90 tablet 3   rivaroxaban  (XARELTO ) 20 MG TABS tablet Take 1 tablet (20 mg total) by mouth daily with supper. 90 tablet 3   rosuvastatin  (CRESTOR ) 20 MG tablet Take 1 tablet (20 mg total) by mouth daily. 90 tablet 3   tirzepatide  (MOUNJARO ) 15 MG/0.5ML Pen Inject 15 mg into the skin once a week. 6 mL 3   torsemide  (DEMADEX ) 20 MG tablet Take 60 mg by mouth daily.     Torsemide  60  MG TABS Take 60 mg by mouth daily. 90 tablet 0   No current facility-administered medications for this visit.       PHYSICAL EXAM:  General: Well appearing.  Cor: No JVD. Regular rhythm, rate.  Lungs: clear Abdomen: soft, nontender, distended. Extremities: no edema Neuro:. Affect pleasant   ECG: not done   ASSESSMENT & PLAN:  1.  Chronic HFrEF/Mixed Ischemic and nonischemic cardiomyopathy:   - Mixed ICM/NICM - NYHA class III  - remains fluid up although he feels like his symptoms are a little better since discharge. Will schedule him for IV lasix  tomorrow and then repeat next week. Will ask HF pharm to check and see if nasal embumyst would be covered  with his insurance. BMET, proBNP with each IV lasix  dose.  - s/p [employer] Sq-ICD; denies any shocks - weight down 4 pounds from last visit here 3 weeks ago - Echo 05/21/21: EF of 40-45% along with mild LVH and mild MR.  - Echo 11/24/21: EF of 20-25% along with mild LVH and mild MR.  - Echo 02/06/22: EF of 30-35% along with mild/moderate AR.  - Echo 08/14/22: EF 40-45% with mild LVH, Grade I DD, severe hypokinesis of left ventricular, entire inferior and inferolateral wall.  - Echo 03/08/23: EF 45-50% with moderate LVH, trivial MR, mild dilatation of aortic root at 43 mm - Echo 10/1823: EF 35-40%, global LV hypokinesis, severely dilated LV internal cavity size, mild LVH, small pericardial effusion, mild MR. - Echo 01/02/24: EF 35-40%, mild LVH, low normal RV, mild LAE, mild/ moderate AR, mild dilatation of the aortic root, measuring 40 mm  - RHC 06/23/24: Mildly elevated filling pressures with moderate to severely reduced CO - Echo 07/13/24: EF 35-40%, G1DD, normal RV.  - RHC 07/13/24: Mildly elevated left and right heart filling pressures (PCWP 14 mmHg, mean RA 7 mmHg). Mild-moderate pulmonary hypertension (PA 48/24, mean 32 mmHg). Mildly-moderately reduced cardiac output on milrinone  0.125 mcg/kg's per minute (Fick CO/CI 4.9 L/min, 2.1  L/min/m; thermodilution CO/CI 4.7 L/min, 2.0 L/min/m). To consider weekly IV lasix  in outpatient setting.  - participating in pulmonary rehab - continue carvedilol  3.125mg  BID - continue Farxiga  10mg  daily - continue digoxin  0.0625mg  daily. Dig level 07/21/24 was <0.6 - continue torsemide  60mg  BID/ potassium 20meq QOD.  - would like to get him back on entresto  but will get lab work back tomorrow first. If start this back, would stop hydralazine .  - continue to use Furoscix  if weight >= 250 - now that he has new insurance, will see if cardiomems can get approved.  - saw ADHF provider (Bensimhon) 09/24 - proBNP 07/27/24 was 1328   2.  CAD/Elevated Troponin and chronic CP:  - H/o CAD  - RHC/ LHC 08/19/23:     1. Relatively stable appearance of the coronary arteries since last catheterization in 2021.  Chronically occluded proximal RCA now has antegrade flow, though I suspect this represents bridging collaterals.  Left-to-right collateral remain as well as moderate LAD and LCx disease.    2. Mildly elevated left heart filling pressures (LVEDP 20 mmHg, PCWP 18 mmHg).    3. Moderate pulmonary hypertension (mean PA 41 mmHg, PVR 5.3 WU).    4. Moderately reduced Fick cardiac output/index (CO 4.3 L/min, CI 1.9 L/min/m^2). - continue rosuvastatin  20mg  daily - continue zetia  10mg  daily - saw cardiology Florestine) 07/25 - lipid panel 01/01/24: LDL 94, triglycerides 341 - insurance is currently not covering repatha  injection   3.  HTN:  - BP  - saw PCP Mazie) 09/25 - BMP 07/27/24 reviewed: sodium 138, potassium 3.8, creatinine 1.27 & GFR >60   4.  H/o PE/Chronic anticoagulation:  - continue xarelto  20mg  daily - no recent bleeding - HG 07/28/24 was 16.3   5.  DM2- - saw nephrology (Korrapati) 08/23  - A1c 04/02/24 was 8.8% - saw endocrinology Art) 09/25   6. OSA- - Sleep study 3/22: AHI 28  - sleep study 12/23; AHI 45.1 - sleep study 11/24 AHI 70.0 - has not worn bipap in  ~ 1 year as he says that he feels like he's choking. He says that he tolerated CPAP better.  - saw pulmonology Jodeen) 11/24; returns 01/26  7: AF- -  saw EP Marny) 10/25 - cardioverted 11/06/23. Plan for possible ablation in the future as previous ablation was cancelled due to clinical instability.  - continue amiodarone  200mg  daily - continue xarelto  20mg  daily     Ellouise DELENA Class, [place] 08/03/2024

## 2024-08-03 NOTE — Telephone Encounter (Signed)
 Called to confirm/remind patient of their appointment at the [facility] Failure Clinic on 08/04/24.   Appointment:   [x] Confirmed  [] Left mess   [] No answer/No voice mail  [] VM Full/unable to leave message  [] Phone not in service  Patient reminded to bring all medications and/or complete list.  Confirmed patient has transportation. Gave directions, instructed to utilize valet parking.

## 2024-08-03 NOTE — Progress Notes (Signed)
 Daily Session Note  Patient Details  Name: Gabriel [name]. MRN: [id number] Date of Birth: 02/19/1971 Referring Provider:   Flowsheet Row Pulmonary Rehab from 07/28/2024 in [facility] Cardiac and Pulmonary Rehab  Referring Provider Cherrie Sieving, MD    Encounter Date: 08/03/2024  Check In:  Session Check In - 08/03/24 0730       Check-In   Supervising physician immediately available to respond to emergencies See telemetry face sheet for immediately available ER MD    Location [facility]-Cardiac & Pulmonary Rehab    Staff Present Burnard Davenport RN,BSN,MPA;Margaret Best, MS, Exercise Physiologist;Jason Elnor RDN,LDN    Virtual Visit No    Medication changes reported     No    Fall or balance concerns reported    No    Warm-up and Cool-down Performed on first and last piece of equipment    Resistance Training Performed Yes    VAD Patient? No    PAD/SET Patient? No      Pain Assessment   Currently in Pain? No/denies             Tobacco Use History[1]  Goals Met:  Proper associated with RPD/PD & O2 Sat Independence with exercise equipment Using PLB without cueing & demonstrates good technique Exercise tolerated well No report of concerns or symptoms today Strength training completed today  Goals Unmet:  Not Applicable  Comments: First full day of exercise!  Patient was oriented to gym and equipment including functions, settings, policies, and procedures.  Patient's individual exercise prescription and treatment plan were reviewed.  All starting workloads were established based on the results of the 6 minute walk test done at initial orientation visit.  The plan for exercise progression was also introduced and progression will be customized based on patient's performance and goals.     Dr. Oneil Pinal is Medical Director for [facility] Cardiac Rehabilitation.  Dr. Fuad Aleskerov is Medical Director for [facility] Pulmonary Rehabilitation.    [1]  Social  History Tobacco Use  Smoking Status Former   Current packs/day: 0.00   Average packs/day: 0.5 packs/day for 33.0 years (16.5 ttl pk-yrs)   Types: Cigarettes   Start date: 05/09/1987   Quit date: 05/08/2020   Years since quitting: 4.2  Smokeless Tobacco Former   Quit date: 05/08/2020  Tobacco Comments   Quit Sept 2021

## 2024-08-04 ENCOUNTER — Encounter: Payer: Self-pay | Admitting: *Deleted

## 2024-08-04 ENCOUNTER — Encounter: Payer: Self-pay | Admitting: Oncology

## 2024-08-04 ENCOUNTER — Telehealth: Payer: Self-pay

## 2024-08-04 ENCOUNTER — Encounter: Payer: Self-pay | Admitting: Family

## 2024-08-04 ENCOUNTER — Ambulatory Visit: Admitting: Family

## 2024-08-04 ENCOUNTER — Ambulatory Visit

## 2024-08-04 ENCOUNTER — Other Ambulatory Visit (HOSPITAL_COMMUNITY): Payer: Self-pay

## 2024-08-04 VITALS — BP 140/76 | HR 88 | Wt 261.0 lb

## 2024-08-04 DIAGNOSIS — Z86711 Personal history of pulmonary embolism: Secondary | ICD-10-CM | POA: Insufficient documentation

## 2024-08-04 DIAGNOSIS — I428 Other cardiomyopathies: Secondary | ICD-10-CM | POA: Insufficient documentation

## 2024-08-04 DIAGNOSIS — Z8249 Family history of ischemic heart disease and other diseases of the circulatory system: Secondary | ICD-10-CM | POA: Insufficient documentation

## 2024-08-04 DIAGNOSIS — G4733 Obstructive sleep apnea (adult) (pediatric): Secondary | ICD-10-CM | POA: Diagnosis not present

## 2024-08-04 DIAGNOSIS — Z79899 Other long term (current) drug therapy: Secondary | ICD-10-CM | POA: Insufficient documentation

## 2024-08-04 DIAGNOSIS — Z7984 Long term (current) use of oral hypoglycemic drugs: Secondary | ICD-10-CM | POA: Diagnosis not present

## 2024-08-04 DIAGNOSIS — I2699 Other pulmonary embolism without acute cor pulmonale: Secondary | ICD-10-CM

## 2024-08-04 DIAGNOSIS — I130 Hypertensive heart and chronic kidney disease with heart failure and stage 1 through stage 4 chronic kidney disease, or unspecified chronic kidney disease: Secondary | ICD-10-CM | POA: Insufficient documentation

## 2024-08-04 DIAGNOSIS — Z59868 Other specified financial insecurity: Secondary | ICD-10-CM | POA: Diagnosis not present

## 2024-08-04 DIAGNOSIS — I482 Chronic atrial fibrillation, unspecified: Secondary | ICD-10-CM | POA: Diagnosis not present

## 2024-08-04 DIAGNOSIS — N1832 Chronic kidney disease, stage 3b: Secondary | ICD-10-CM | POA: Diagnosis not present

## 2024-08-04 DIAGNOSIS — I5022 Chronic systolic (congestive) heart failure: Secondary | ICD-10-CM | POA: Diagnosis not present

## 2024-08-04 DIAGNOSIS — Z833 Family history of diabetes mellitus: Secondary | ICD-10-CM | POA: Insufficient documentation

## 2024-08-04 DIAGNOSIS — G8929 Other chronic pain: Secondary | ICD-10-CM | POA: Diagnosis not present

## 2024-08-04 DIAGNOSIS — K7460 Unspecified cirrhosis of liver: Secondary | ICD-10-CM | POA: Diagnosis not present

## 2024-08-04 DIAGNOSIS — E1122 Type 2 diabetes mellitus with diabetic chronic kidney disease: Secondary | ICD-10-CM | POA: Insufficient documentation

## 2024-08-04 DIAGNOSIS — I2510 Atherosclerotic heart disease of native coronary artery without angina pectoris: Secondary | ICD-10-CM | POA: Diagnosis not present

## 2024-08-04 DIAGNOSIS — N189 Chronic kidney disease, unspecified: Secondary | ICD-10-CM | POA: Insufficient documentation

## 2024-08-04 DIAGNOSIS — Z8616 Personal history of COVID-19: Secondary | ICD-10-CM | POA: Diagnosis not present

## 2024-08-04 DIAGNOSIS — I255 Ischemic cardiomyopathy: Secondary | ICD-10-CM | POA: Diagnosis not present

## 2024-08-04 DIAGNOSIS — I272 Pulmonary hypertension, unspecified: Secondary | ICD-10-CM | POA: Diagnosis not present

## 2024-08-04 DIAGNOSIS — Z7901 Long term (current) use of anticoagulants: Secondary | ICD-10-CM | POA: Diagnosis not present

## 2024-08-04 DIAGNOSIS — I10 Essential (primary) hypertension: Secondary | ICD-10-CM | POA: Diagnosis not present

## 2024-08-04 DIAGNOSIS — Z794 Long term (current) use of insulin: Secondary | ICD-10-CM | POA: Insufficient documentation

## 2024-08-04 DIAGNOSIS — Z87891 Personal history of nicotine dependence: Secondary | ICD-10-CM | POA: Insufficient documentation

## 2024-08-04 DIAGNOSIS — Z95810 Presence of automatic (implantable) cardiac defibrillator: Secondary | ICD-10-CM | POA: Insufficient documentation

## 2024-08-04 DIAGNOSIS — I25118 Atherosclerotic heart disease of native coronary artery with other forms of angina pectoris: Secondary | ICD-10-CM | POA: Diagnosis not present

## 2024-08-04 DIAGNOSIS — I480 Paroxysmal atrial fibrillation: Secondary | ICD-10-CM | POA: Diagnosis not present

## 2024-08-04 DIAGNOSIS — J4489 Other specified chronic obstructive pulmonary disease: Secondary | ICD-10-CM | POA: Diagnosis not present

## 2024-08-04 DIAGNOSIS — Z7985 Long-term (current) use of injectable non-insulin antidiabetic drugs: Secondary | ICD-10-CM | POA: Insufficient documentation

## 2024-08-04 MED ORDER — HYDRALAZINE HCL 25 MG PO TABS: 25.0000 mg | ORAL_TABLET | Freq: Two times a day (BID) | ORAL | Status: AC

## 2024-08-04 MED ORDER — DIGOXIN 62.5 MCG PO TABS: 0.0625 mg | ORAL_TABLET | Freq: Every day | ORAL | 5 refills | Status: AC

## 2024-08-04 NOTE — Progress Notes (Deleted)
 S:     Reason for visit: ?  Gabriel [name]. is a 53 y.o. male with a history of diabetes (type 2), who presents today for a follow up diabetes Face to Face pharmacotherapy visit.? Pertinent PMH also includes CAD, NASH, CKD stage 3b, hx of PE, ICD, psoriatic arthritis, COPD, Afib, HFrEF.   Care Team: Primary Care Provider: Franchot Isaiah LABOR, MD  Patient was admitted to [facility] from 11/18-11/26/25 with acute on chronic systolic heart failure. Patient was instructed to discontinue Furoscix , Entresto , fenofibrate , and spironolactone  upon discharge.  Patient was seen at   At last visit with PCP on 07/29/24, patient reported fasting BG readings in the 200s. Basaglar  was increased to 55 units daily.   Patient reports Diabetes was diagnosed in ***.   Current diabetes medications include: Mounjaro  15 mg weekly, Basaglar  55 units daily, Novolog  20 units TID with meals + 1 unit correction for every 50 mg/dL above 849 prior to meals, Farxiga  10 mg daily Previous diabetes medications include: Semglee , Lantus , Humalog , Victoza, metformin , Januvia, and Ozempic   Current hypertension medications include: carvedilol  3.125 mg BID, torsemide  40 mg daily, Furoscix  80 mg PRN, spironolactone  25 mg daily Current hyperlipidemia medications include: ezetimibe  10 mg daily, fenofibrate  145 mg daily, rosuvastatin  20 mg daily  Patient reports adherence to taking all medications as prescribed.  *** Patient denies adherence with medications, reports missing *** medications *** times per week, on average.  Have you been experiencing any side effects to the medications prescribed? no Do you have any problems obtaining medications due to transportation or finances? no Insurance coverage: [facility] Medicare/[place] Medicaid  Patient {Actions; denies-reports:120008} hypoglycemic events.  Reported home fasting blood sugars: ***  Reported 2 hour post-meal/random blood sugars: ***.  Patient {Actions; denies-reports:120008}  nocturia (nighttime urination).  Patient {Actions; denies-reports:120008} neuropathy (nerve pain). Patient {Actions; denies-reports:120008} visual changes. Patient {Actions; denies-reports:120008} self foot exams.   Patient reported dietary habits: Eats *** meals/day Breakfast: *** Lunch: *** Dinner: *** Snacks: *** Drinks: ***  Patient-reported exercise habits: *** DM Prevention:  Statin: {Blank single:19197::***,Taking,Not taking,Intolerant to,Declines}; {Blank single:19197::low intensity,moderate intensity,high intensity,n/a}.?  ACE/ARB: {Blank single:19197::yes,no}; *** History of chronic kidney disease? {Blank single:19197::yes,no} Last urinary albumin /creatinine ratio:  Lab Results  Component Value Date   MICRALBCREAT 43 (H) 04/02/2024   MICRALBCREAT 25 04/16/2023   MICRALBCREAT 49 (H) 02/08/2022   Last eye exam:  Lab Results  Component Value Date   HMDIABEYEEXA No Retinopathy 05/04/2024   Lab Results  Component Value Date   HMDIABEYEEXA No Retinopathy 05/04/2024   Last foot exam: No foot exam found Tobacco Use:  Tobacco Use: Medium Risk (07/29/2024)   Patient History    Smoking Tobacco Use: Former    Smokeless Tobacco Use: Former    Passive Exposure: Not on file   O:  [employer] Report    Vitals:  Wt Readings from Last 3 Encounters:  07/29/24 263 lb (119.3 kg)  07/28/24 267 lb (121.1 kg)  07/28/24 268 lb (121.6 kg)   BP Readings from Last 3 Encounters:  07/29/24 (!) 140/80  07/28/24 (!) 146/85  07/27/24 (!) 174/92   Pulse Readings from Last 3 Encounters:  07/29/24 97  07/28/24 98  07/27/24 94     Labs:?  Lab Results  Component Value Date   HGBA1C 8.8 (A) 04/02/2024   HGBA1C 6.9 (H) 01/01/2024   HGBA1C 6.6 (A) 07/11/2023   GLUCOSE 327 (H) 07/27/2024   MICRALBCREAT 43 (H) 04/02/2024   MICRALBCREAT 25 04/16/2023   MICRALBCREAT 49 (  H) 02/08/2022   CREATININE 1.27 (H) 07/27/2024   CREATININE 1.70 (H) 07/21/2024    CREATININE 1.76 (H) 07/14/2024    Lab Results  Component Value Date   CHOL 180 01/01/2024   LDLCALC 94 01/01/2024   LDLCALC 54 12/05/2023   LDLCALC 62 08/09/2022   LDLDIRECT 60.6 11/23/2020   LDLDIRECT 65.3 01/18/2020   HDL 29 (L) 01/01/2024   TRIG 341 (H) 01/01/2024   TRIG 344 (H) 01/01/2024   TRIG 216 (H) 12/05/2023   ALT 15 07/09/2024   ALT 21 07/06/2024   AST 19 07/09/2024   AST 22 07/06/2024      Chemistry      Component Value Date/Time   NA 138 07/27/2024 0952   NA 137 04/06/2024 1224   NA 139 10/07/2013 1703   K 3.8 07/27/2024 0952   K 3.8 10/07/2013 1703   CL 99 07/27/2024 0952   CL 107 10/07/2013 1703   CO2 26 07/27/2024 0952   CO2 25 10/07/2013 1703   BUN 25 (H) 07/27/2024 0952   BUN 43 (H) 04/06/2024 1224   BUN 10 10/07/2013 1703   CREATININE 1.27 (H) 07/27/2024 0952   CREATININE 1.20 09/29/2023 1337   CREATININE 0.77 10/07/2013 1703   GLU 188 (H) 01/01/2024 0933      Component Value Date/Time   CALCIUM  9.6 07/27/2024 0952   CALCIUM  8.9 10/07/2013 1703   ALKPHOS 72 07/09/2024 1343   ALKPHOS 83 11/12/2012 1713   AST 19 07/09/2024 1343   AST 22 09/29/2023 1337   ALT 15 07/09/2024 1343   ALT 24 09/29/2023 1337   ALT 57 11/12/2012 1713   BILITOT 1.0 07/09/2024 1343   BILITOT 1.0 04/06/2024 1224   BILITOT 1.3 (H) 09/29/2023 1337       The ASCVD Risk score (Arnett DK, et al., 2019) failed to calculate for the following reasons:   Risk score cannot be calculated because patient has a medical history suggesting prior/existing ASCVD   * - Cholesterol units were assumed  Lab Results  Component Value Date   MICRALBCREAT 43 (H) 04/02/2024   MICRALBCREAT 25 04/16/2023   MICRALBCREAT 49 (H) 02/08/2022    A/P: Diabetes currently *** with a most recent A1c of *** on ***, which is {DESC; UP/DOWN/UNCHANGED:18711} from *** on ***. Patient is *** able to verbalize appropriate hypoglycemia management plan. Medication adherence appears ***. Control is  suboptimal due to ***. -{Meds adjust:18428} basal insulin  {basal insulins:33573}  *** units daily.  -{Meds adjust:18428} rapid insulin  {bolus insulin :33574} ***.  -{Meds adjust:18428} GLP-1 {GLP1 options:33572} *** mg .  -{Meds adjust:18428} SGLT2-I {SGLT2i options:33571}*** mg daily.  -{Meds adjust:18428} metformin  ***.  -Patient educated on purpose, proper use, and potential adverse effects of ***.  -Extensively discussed pathophysiology of diabetes, recommended lifestyle interventions, dietary effects on blood sugar control.  -Counseled on s/sx of and management of hypoglycemia.  -Next A1c anticipated ***.   ASCVD risk - secondary prevention in patient with diabetes. Last LDL is 94 mg/dL, not at goal of <44 mg/dL.  -Continued rosuvastatin  20 mg daily.  -Continued ezetimibe  10 mg daily  -Continued fenofibrate  145 mg daily  Hypertension longstanding *** currently ***. Blood pressure goal of <130/80 *** mmHg. Medication adherence ***. Blood pressure control is suboptimal due to ***. -{Meds adjust:18428} *** mg ***.  {pharmacisttime:33368}  Follow-up:  Pharmacist on *** PCP clinic visit on ***  Peyton CHARLENA Ferries, PharmD, CPP Clinical Pharmacist [facility] [phone]

## 2024-08-04 NOTE — Progress Notes (Addendum)
 Pulmonary Individual Treatment Plan  Patient Details  Name: Gabriel [name]. MRN: [id number] Date of Birth: [birth year] Referring Provider:   Flowsheet Row Pulmonary Rehab from 07/28/2024 in [facility] Cardiac and Pulmonary Rehab  Referring Provider Cherrie Sieving, MD    Initial Encounter Date:  Flowsheet Row Pulmonary Rehab from 07/28/2024 in [facility] Cardiac and Pulmonary Rehab  Date 07/28/24    Visit Diagnosis: Heart failure, chronic systolic (HCC)  Patient's Home Medications on Admission: Current Medications[1]  Past Medical History: Past Medical History:  Diagnosis Date   Anginal pain    Aortic atherosclerosis    Aortic root dilation 11/28/2016   a.) TTE 11/28/2016: 43 mm; b.) TTE 11/23/2019: 38 mm; c.) TTE 08/14/2022: 40 mm; d.) TTE 03/08/2023: 43 mm; e.) TTE 01/02/2024: 40 mm   Arteriosclerosis of right carotid artery    a.) carotid doppler 04/29/2017: <50% RICA   Atrial fibrillation with RVR (HCC)    a.) CHA2DS2VASc = 6 (HFrEF, HTN, CVA x 2, vascular disease, T2DM) as of 01/27/2024; b.) s/p DCCV 11/06/2023 --> 200 J x 1 -->  NSR; c.) rate/rhythm maintained on oral amiodarone  + carvedilol ; chronically anticoagulated with rivaroxaban    CAD (coronary artery disease)    a.) MV 04/2015: low risk; b.) LHC 01/06/2017: minor irregs in LAD/Diag/LCX/OM, RCA 40p/m/d; c.) R/LHC 06/13/2020: 50% dLAD, 30% p-mLCx, 40% OM1, 100% pRCA (L-R collats) - med mgmt; d.) [facility] 08/19/2023: 50% dLAD, 30% p-mLCx, 40% OM1, 100% pRCA (L-R collats), 50% p-mRCA - med mgmt   Cardiomegaly    Cerebellar infarct (HCC)    a.) brain MRI 06/23/2023: tiny chronic infarct within the RIGHT cerebellar hemisphere; unsure of timing of neurological event; no deficits   Chest wall pain, chronic    Chronic 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    Chronic Troponin Elevation    CKD (chronic kidney disease), stage II-III    COPD (chronic obstructive pulmonary disease) (HCC)    CPAP (continuous positive airway pressure) dependence  03/08/2023   Depression    DM (diabetes mellitus), type 2 (HCC)    Dyspnea    GERD (gastroesophageal reflux disease)    Headache    HFrEF (heart failure with reduced ejection fraction) (HCC)    History of 2019 novel coronavirus disease (COVID-19) 03/01/2022   History of kidney stones    History of methicillin resistant staphylococcus aureus (MRSA)    Hyperlipidemia    Hypertension    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 12/2015 noted on tele-->amio;  b. 12/2015 Event monitor: no VT noted.   Obesity (BMI 30.0-34.9)    On rivaroxaban  therapy    OSA treated with BiPAP    Pneumonia    Polyp of descending colon  Psoriasis    Psoriatic arthritis (HCC)    a.) Tx'd with ixekizumab   Pulmonary HTN (HCC) 02/29/2016   a.) TTE 02/29/2016: RVSP 47; b.) TTE 06/26/2019: RVSP 58.4; c.) TTE 12/08/2019: RVSP 40.8; d.) TTE 06/08/2020: RVSP 44.7; e.) R/LHC 06/08/2020: mRA 9, mPA 40, mPWCP 17, AO sat 98, PA sat 61, CO 3.1, CI 1.3, PVR 7.4, LVEDP 20; f.) R/LHC 08/18/2020: mRA 5, mPA 41, mPCWP 18, AO sat 92, PA sat 59, CO 4.3, CI 1.9, PVR 5.3   Recurrent pulmonary emboli (HCC) 06/07/2020   06/07/20: small  bilateral PEs.  12/31/19: RUL and RLL PEs.   Secondary polycythemia    a.) negative for JAK2 with reflex and BCR-ABL mutations; seeing hematology with etiology felt to be secondary to underlying COPD (quit smoking 04/2020), heart disease, OSAH, and obesity   Stroke [facility])    Syncope    a. 01/2016 - felt to be vasovagal.   Thrombocytopenia     Tobacco Use: Tobacco Use History[2]  Labs: Review Flowsheet  More data exists      Latest Ref Rng & Units 01/01/2024 04/02/2024 06/23/2024 07/12/2024 07/13/2024  Labs for ITP Cardiac and Pulmonary Rehab  Cholestrol 100 - 199 mg/dL 819  820  - - - -  LDL (calc) 0 - 99 mg/dL 94  - - - -  HDL-C >60 mg/dL 29  - - - -  Trlycerides 0 - 149 mg/dL 658  655  - - - -  Hemoglobin A1c 4.0 - 5.6 % 6.9  8.8  - - -  Bicarbonate 20.0 - 28.0 mmol/L - - 29.2  28.0  - 31.6   TCO2 22 - 32 mmol/L - - 31  29  - 33   O2 Saturation % - - 64  59  66.9  65     Details       Multiple values from one day are sorted in reverse-chronological order          Pulmonary Assessment Scores:  Pulmonary Assessment Scores     Row Name 07/28/24 1528         ADL UCSD   ADL Phase Entry     SOB Score total 53     Rest 0     Walk 4     Stairs 4     Bath 5     Dress 2     Shop 3       CAT Score   CAT Score 20       mMRC Score   mMRC Score 4        UCSD: Self-administered rating of dyspnea associated with activities of daily living (ADLs) 6-point scale (0 = not at all to 5 = maximal or unable to do because of breathlessness)  Scoring Scores range from 0 to 120.  Minimally important difference is 5 units  CAT: CAT can identify the health impairment of COPD patients and is better correlated with disease progression.  CAT has a scoring range of zero to 40. The CAT score is classified into four groups of low (less than 10), medium (10 - 20), high (21-30) and very high (31-40) based on the impact level of disease on health status. A CAT score over 10 suggests  significant symptoms.  A worsening CAT score could be explained by an exacerbation, poor medication adherence, poor inhaler technique, or progression of COPD or comorbid conditions.  CAT MCID is 2 points  mMRC: mMRC (Modified Medical Research Council) Dyspnea  Scale is used to assess the degree of baseline functional disability in patients of respiratory disease due to dyspnea. No minimal important difference is established. A decrease in score of 1 point or greater is considered a positive change.   Pulmonary Function Assessment:  Pulmonary Function Assessment - 07/26/24 1417       Breath   Shortness of Breath Yes;Limiting activity          Exercise Target Goals: Exercise Program Goal: Individual exercise prescription set using results from initial 6 min walk test and THRR while considering  patients activity barriers and safety.   Exercise Prescription Goal: Initial exercise prescription builds to 30-45 minutes a day of aerobic activity, 2-3 days per week.  Home exercise guidelines will be given to patient during program as part of exercise prescription that the participant will acknowledge.  Education: Aerobic Exercise: - Group verbal and visual presentation on the components of exercise prescription. Introduces F.I.T.T principle from ACSM for exercise prescriptions.  Reviews F.I.T.T. principles of aerobic exercise including progression. Written material provided at class time. Flowsheet Row Cardiac Rehab from 08/08/2022 in [facility] Cardiac and Pulmonary Rehab  Education need identified 07/01/22    Education: Resistance Exercise: - Group verbal and visual presentation on the components of exercise prescription. Introduces F.I.T.T principle from ACSM for exercise prescriptions  Reviews F.I.T.T. principles of resistance exercise including progression. Written material provided at class time.    Education: Exercise & Equipment Safety: - Individual verbal instruction and demonstration  of equipment use and safety with use of the equipment. Flowsheet Row Pulmonary Rehab from 07/28/2024 in [facility] Cardiac and Pulmonary Rehab  Date 07/28/24  Educator MB  Instruction Review Code 1- Verbalizes Understanding    Education: Exercise Physiology & General Exercise Guidelines: - Group verbal and written instruction with models to review the exercise physiology of the cardiovascular system and associated critical values. Provides general exercise guidelines with specific guidelines to those with heart or lung disease.  Flowsheet Row Pulmonary Rehab from 07/28/2024 in [facility] Cardiac and Pulmonary Rehab  Education need identified 07/28/24    Education: Flexibility, Balance, Mind/Body Relaxation: - Group verbal and visual presentation with interactive activity on the components of exercise prescription. Introduces F.I.T.T principle from ACSM for exercise prescriptions. Reviews F.I.T.T. principles of flexibility and balance exercise training including progression. Also discusses the mind body connection.  Reviews various relaxation techniques to help reduce and manage stress (i.e. Deep breathing, progressive muscle relaxation, and visualization). Balance handout provided to take home. Written material provided at class time.   Activity Barriers & Risk Stratification:  Activity Barriers & Cardiac Risk Stratification - 07/28/24 1517       Activity Barriers & Cardiac Risk Stratification   Activity Barriers Arthritis;Deconditioning;Decreased Ventricular Function;Shortness of Breath          6 Minute Walk:  6 Minute Walk     Row Name 07/28/24 1512         6 Minute Walk   Phase Initial     Distance 900 feet     Walk Time 6 minutes     # of Rest Breaks 0     MPH 1.7     METS 2.8     RPE 17     Perceived Dyspnea  2     VO2 Peak 9.8     Symptoms Yes (comment)     Comments Bilateral Hip Pain 10/10     Resting HR 89 bpm     Resting BP 150/88  Resting Oxygen Saturation  98 %      Exercise Oxygen Saturation  during 6 min walk 92 %     Max Ex. HR 105 bpm     Max Ex. BP 158/80     2 Minute Post BP 144/80       Interval HR   1 Minute HR 97     2 Minute HR 99     3 Minute HR 97     4 Minute HR 105     5 Minute HR 100     6 Minute HR 98     2 Minute Post HR 92     Interval Heart Rate? Yes       Interval Oxygen   Interval Oxygen? Yes     Baseline Oxygen Saturation % 98 %     1 Minute Oxygen Saturation % 94 %     1 Minute Liters of Oxygen 0 L     2 Minute Oxygen Saturation % 93 %     2 Minute Liters of Oxygen 0 L     3 Minute Oxygen Saturation % 92 %     3 Minute Liters of Oxygen 0 L     4 Minute Oxygen Saturation % 94 %     4 Minute Liters of Oxygen 0 L     5 Minute Oxygen Saturation % 96 %     5 Minute Liters of Oxygen 0 L     6 Minute Oxygen Saturation % 95 %     6 Minute Liters of Oxygen 0 L     2 Minute Post Oxygen Saturation % 96 %     2 Minute Post Liters of Oxygen 0 L       Oxygen Initial Assessment:  Oxygen Initial Assessment - 07/26/24 1416       Home Oxygen   Home Oxygen Device None    Sleep Oxygen Prescription CPAP    Liters per minute 0    Home Exercise Oxygen Prescription None    Home Resting Oxygen Prescription None    Compliance with Home Oxygen Use Yes      Initial 6 min Walk   Oxygen Used None      Program Oxygen Prescription   Program Oxygen Prescription None      Intervention   Short Term Goals To learn and demonstrate proper pursed lip breathing techniques or other breathing techniques. ;To learn and understand importance of maintaining oxygen saturations>88%;To learn and understand importance of monitoring SPO2 with pulse oximeter and demonstrate accurate use of the pulse oximeter.;To learn and exhibit compliance with exercise, home and travel O2 prescription;To learn and demonstrate proper use of respiratory medications    Long  Term Goals Maintenance of O2 saturations>88%;Verbalizes importance of monitoring SPO2 with  pulse oximeter and return demonstration;Exhibits proper breathing techniques, such as pursed lip breathing or other method taught during program session;Exhibits compliance with exercise, home  and travel O2 prescription;Compliance with respiratory medication;Demonstrates proper use of MDIs          Oxygen Re-Evaluation:  Oxygen Re-Evaluation     Row Name 08/03/24 0731             Program Oxygen Prescription   Program Oxygen Prescription None         Home Oxygen   Home Oxygen Device None       Sleep Oxygen Prescription CPAP       Liters per minute 0  Home Exercise Oxygen Prescription None       Home Resting Oxygen Prescription None       Compliance with Home Oxygen Use Yes         Goals/Expected Outcomes   Short Term Goals To learn and demonstrate proper pursed lip breathing techniques or other breathing techniques. ;To learn and understand importance of maintaining oxygen saturations>88%;To learn and understand importance of monitoring SPO2 with pulse oximeter and demonstrate accurate use of the pulse oximeter.;To learn and exhibit compliance with exercise, home and travel O2 prescription;To learn and demonstrate proper use of respiratory medications       Long  Term Goals Maintenance of O2 saturations>88%;Verbalizes importance of monitoring SPO2 with pulse oximeter and return demonstration;Exhibits proper breathing techniques, such as pursed lip breathing or other method taught during program session;Exhibits compliance with exercise, home  and travel O2 prescription;Compliance with respiratory medication;Demonstrates proper use of MDIs       Comments Reviewed PLB technique with pt.  Talked about how it works and it's importance in maintaining their exercise saturations.       Goals/Expected Outcomes Short: Become more profiecient at using PLB. Long: Become independent at using PLB.          Oxygen Discharge (Final Oxygen Re-Evaluation):  Oxygen Re-Evaluation - 08/03/24  0731       Program Oxygen Prescription   Program Oxygen Prescription None      Home Oxygen   Home Oxygen Device None    Sleep Oxygen Prescription CPAP    Liters per minute 0    Home Exercise Oxygen Prescription None    Home Resting Oxygen Prescription None    Compliance with Home Oxygen Use Yes      Goals/Expected Outcomes   Short Term Goals To learn and demonstrate proper pursed lip breathing techniques or other breathing techniques. ;To learn and understand importance of maintaining oxygen saturations>88%;To learn and understand importance of monitoring SPO2 with pulse oximeter and demonstrate accurate use of the pulse oximeter.;To learn and exhibit compliance with exercise, home and travel O2 prescription;To learn and demonstrate proper use of respiratory medications    Long  Term Goals Maintenance of O2 saturations>88%;Verbalizes importance of monitoring SPO2 with pulse oximeter and return demonstration;Exhibits proper breathing techniques, such as pursed lip breathing or other method taught during program session;Exhibits compliance with exercise, home  and travel O2 prescription;Compliance with respiratory medication;Demonstrates proper use of MDIs    Comments Reviewed PLB technique with pt.  Talked about how it works and it's importance in maintaining their exercise saturations.    Goals/Expected Outcomes Short: Become more profiecient at using PLB. Long: Become independent at using PLB.          Initial Exercise Prescription:  Initial Exercise Prescription - 07/28/24 1500       Date of Initial Exercise RX and Referring Provider   Date 07/28/24    Referring Provider Cherrie Sieving, MD      Oxygen   Maintain Oxygen Saturation 88% or higher      Treadmill   MPH 1.5    Grade 0    Minutes 15    METs 2.15      Recumbant Bike   Level 2    Watts 15    Minutes 15    METs 2.8      NuStep   Level 2   T4 and T6   SPM 80    Minutes 15    METs 2.8  Arm Ergometer    Level 1    RPM 25    Minutes 15    METs 2.8      T5 Nustep   Level 2    SPM 80    Minutes 15    METs 2.8      Prescription Details   Frequency (times per week) 2    Duration Progress to 30 minutes of continuous aerobic without signs/symptoms of physical distress      Intensity   THRR 40-80% of Max Heartrate 119-149    Ratings of Perceived Exertion 11-13    Perceived Dyspnea 0-4      Progression   Progression Continue to progress workloads to maintain intensity without signs/symptoms of physical distress.      Resistance Training   Training Prescription Yes    Weight 7 lb    Reps 10-15          Perform Capillary Blood Glucose checks as needed.  Exercise Prescription Changes:   Exercise Prescription Changes     Row Name 07/28/24 1500             Response to Exercise   Blood Pressure (Admit) 150/88       Blood Pressure (Exercise) 158/80       Blood Pressure (Exit) 144/80       Heart Rate (Admit) 89 bpm       Heart Rate (Exercise) 105 bpm       Heart Rate (Exit) 92 bpm       Oxygen Saturation (Admit) 98 %       Oxygen Saturation (Exercise) 92 %       Oxygen Saturation (Exit) 96 %       Rating of Perceived Exertion (Exercise) 17       Perceived Dyspnea (Exercise) 2       Symptoms Bilateral hip pain 10/10       Comments results         Progression   Average METs 2.8          Exercise Comments:   Exercise Comments     Row Name 08/03/24 0731           Exercise Comments First full day of exercise!  Patient was oriented to gym and equipment including functions, settings, policies, and procedures.  Patient's individual exercise prescription and treatment plan were reviewed.  All starting workloads were established based on the results of the 6 minute walk test done at initial orientation visit.  The plan for exercise progression was also introduced and progression will be customized based on patient's performance and goals.          Exercise  Goals and Review:   Exercise Goals     Row Name 07/28/24 1525             Exercise Goals   Increase Physical Activity Yes       Intervention Provide advice, education, support and counseling about physical activity/exercise needs.;Develop an individualized exercise prescription for aerobic and resistive training based on initial evaluation findings, risk stratification, comorbidities and participant's personal goals.       Expected Outcomes Short Term: Attend rehab on a regular basis to increase amount of physical activity.;Long Term: Add in home exercise to make exercise part of routine and to increase amount of physical activity.;Long Term: Exercising regularly at least 3-5 days a week.       Increase Strength and Stamina Yes  Intervention Provide advice, education, support and counseling about physical activity/exercise needs.;Develop an individualized exercise prescription for aerobic and resistive training based on initial evaluation findings, risk stratification, comorbidities and participant's personal goals.       Expected Outcomes Short Term: Increase workloads from initial exercise prescription for resistance, speed, and METs.;Short Term: Perform resistance training exercises routinely during rehab and add in resistance training at home;Long Term: Improve cardiorespiratory fitness, muscular endurance and strength as measured by increased METs and functional capacity ( )       Able to understand and use rate of perceived exertion (RPE) scale Yes       Intervention Provide education and explanation on how to use RPE scale       Expected Outcomes Short Term: Able to use RPE daily in rehab to express subjective intensity level;Long Term:  Able to use RPE to guide intensity level when exercising independently       Able to understand and use Dyspnea scale Yes       Intervention Provide education and explanation on how to use Dyspnea scale       Expected Outcomes Short Term: Able to  use Dyspnea scale daily in rehab to express subjective sense of shortness of breath during exertion;Long Term: Able to use Dyspnea scale to guide intensity level when exercising independently       Knowledge and understanding of Target Heart Rate Range (THRR) Yes       Intervention Provide education and explanation of THRR including how the numbers were predicted and where they are located for reference       Expected Outcomes Short Term: Able to state/look up THRR;Short Term: Able to use daily as guideline for intensity in rehab;Long Term: Able to use THRR to govern intensity when exercising independently       Able to check pulse independently Yes       Intervention Provide education and demonstration on how to check pulse in carotid and radial arteries.;Review the importance of being able to check your own pulse for safety during independent exercise       Expected Outcomes Short Term: Able to explain why pulse checking is important during independent exercise;Long Term: Able to check pulse independently and accurately       Understanding of Exercise Prescription Yes       Intervention Provide education, explanation, and written materials on patient's individual exercise prescription       Expected Outcomes Short Term: Able to explain program exercise prescription;Long Term: Able to explain home exercise prescription to exercise independently          Exercise Goals Re-Evaluation :  Exercise Goals Re-Evaluation     Row Name 08/03/24 0731             Exercise Goal Re-Evaluation   Exercise Goals Review Increase Physical Activity;Able to understand and use rate of perceived exertion (RPE) scale;Knowledge and understanding of Target Heart Rate Range (THRR);Understanding of Exercise Prescription;Able to understand and use Dyspnea scale;Increase Strength and Stamina;Able to check pulse independently       Comments Reviewed RPE and dyspnea scale, THR and program prescription with pt today.  Pt  voiced understanding and was given a copy of goals to take home.       Expected Outcomes Short: Use RPE daily to regulate intensity. Long: Follow program prescription in THR.          Discharge Exercise Prescription (Final Exercise Prescription Changes):  Exercise Prescription Changes - 07/28/24 1500  Response to Exercise   Blood Pressure (Admit) 150/88    Blood Pressure (Exercise) 158/80    Blood Pressure (Exit) 144/80    Heart Rate (Admit) 89 bpm    Heart Rate (Exercise) 105 bpm    Heart Rate (Exit) 92 bpm    Oxygen Saturation (Admit) 98 %    Oxygen Saturation (Exercise) 92 %    Oxygen Saturation (Exit) 96 %    Rating of Perceived Exertion (Exercise) 17    Perceived Dyspnea (Exercise) 2    Symptoms Bilateral hip pain 10/10    Comments results      Progression   Average METs 2.8          Nutrition:  Target Goals: Understanding of nutrition guidelines, daily intake of sodium 1500mg , cholesterol 200mg , calories 30% from fat and 7% or less from saturated fats, daily to have 5 or more servings of fruits and vegetables.  Education: Nutrition 1 -Group instruction provided by verbal, written material, interactive activities, discussions, models, and posters to present general guidelines for heart healthy nutrition including macronutrients, label reading, and promoting whole foods over processed counterparts. Education serves as pensions consultant of discussion of heart healthy eating for all. Written material provided at class time.     Education: Nutrition 2 -Group instruction provided by verbal, written material, interactive activities, discussions, models, and posters to present general guidelines for heart healthy nutrition including sodium, cholesterol, and saturated fat. Providing guidance of habit forming to improve blood pressure, cholesterol, and body weight. Written material provided at class time.     Biometrics:  Pre Biometrics - 07/28/24 1526       Pre  Biometrics   Height 6' 0.4 (1.839 m)    Weight 267 lb (121.1 kg)    Waist Circumference 48.2 inches    Hip Circumference 42.2 inches    Waist to Hip Ratio 1.14 %    BMI (Calculated) 35.81    Single Leg Stand 15.9 seconds           Nutrition Therapy Plan and Nutrition Goals:  Nutrition Therapy & Goals - 07/28/24 1531       Nutrition Therapy   RD appointment deferred Yes      Personal Nutrition Goals   Nutrition Goal RD appointment deferred at this time.      Intervention Plan   Intervention Prescribe, educate and counsel regarding individualized specific dietary modifications aiming towards targeted core components such as weight, hypertension, lipid management, diabetes, heart failure and other comorbidities.    Expected Outcomes Short Term Goal: Understand basic principles of dietary content, such as calories, fat, sodium, cholesterol and nutrients.          Nutrition Assessments:  MEDIFICTS Score Key: >=70 Need to make dietary changes  40-70 Heart Healthy Diet <= 40 Therapeutic Level Cholesterol Diet  Flowsheet Row Cardiac Rehab from 07/01/2022 in [facility] Cardiac and Pulmonary Rehab  Picture Your Plate Total Score on Admission 73   Picture Your Plate Scores: <59 Unhealthy dietary pattern with much room for improvement. 41-50 Dietary pattern unlikely to meet recommendations for good health and room for improvement. 51-60 More healthful dietary pattern, with some room for improvement.  >60 Healthy dietary pattern, although there may be some specific behaviors that could be improved.   Nutrition Goals Re-Evaluation:   Nutrition Goals Discharge (Final Nutrition Goals Re-Evaluation):   Psychosocial: Target Goals: Acknowledge presence or absence of significant depression and/or stress, maximize coping skills, provide positive support system. Participant is able to verbalize  types and ability to use techniques and skills needed for reducing stress and depression.    Education: Stress, Anxiety, and Depression - Group verbal and visual presentation to define topics covered.  Reviews how body is impacted by stress, anxiety, and depression.  Also discusses healthy ways to reduce stress and to treat/manage anxiety and depression.  Written material provided at class time.   Education: Sleep Hygiene -Provides group verbal and written instruction about how sleep can affect your health.  Define sleep hygiene, discuss sleep cycles and impact of sleep habits. Review good sleep hygiene tips.    Initial Review & Psychosocial Screening:  Initial Psych Review & Screening - 07/26/24 1419       Initial Review   Current issues with None Identified      Family Dynamics   Good Support System? Yes    Comments He can look to his dad for support and is now getting health insurance and is less stressed than last time he was in the program.      Barriers   Psychosocial barriers to participate in program The patient should benefit from training in stress management and relaxation.      Screening Interventions   Interventions Encouraged to exercise;To provide support and resources with identified psychosocial needs;Provide feedback about the scores to participant    Expected Outcomes Short Term goal: Utilizing psychosocial counselor, staff and physician to assist with identification of specific Stressors or current issues interfering with healing process. Setting desired goal for each stressor or current issue identified.;Long Term Goal: Stressors or current issues are controlled or eliminated.;Short Term goal: Identification and review with participant of any Quality of Life or Depression concerns found by scoring the questionnaire.;Long Term goal: The participant improves quality of Life and PHQ9 Scores as seen by post scores and/or verbalization of changes          Quality of Life Scores:  Scores of 19 and below usually indicate a poorer quality of life in these  areas.  A difference of  2-3 points is a clinically meaningful difference.  A difference of 2-3 points in the total score of the Quality of Life Index has been associated with significant improvement in overall quality of life, self-image, physical symptoms, and general health in studies assessing change in quality of life.  PHQ-9: Review Flowsheet  More data exists      07/29/2024 07/28/2024 06/17/2024 05/26/2024 05/06/2024  Depression screen PHQ 2/9  Decreased Interest 1 1 1  0 0  Down, Depressed, Hopeless 0 0 0 0 0  PHQ - 2 Score 1 1 1  0 0  Altered sleeping 1 1 - - -  Tired, decreased energy 3 3 - - -  Change in appetite 1 1 - - -  Feeling bad or failure about yourself  0 0 - - -  Trouble concentrating 0 1 - - -  Moving slowly or fidgety/restless 0 0 - - -  Suicidal thoughts 0 0 - - -  PHQ-9 Score 6 7 - - -  Difficult doing work/chores Somewhat difficult Somewhat difficult - - -   Interpretation of Total Score  Total Score Depression Severity:  1-4 = Minimal depression, 5-9 = Mild depression, 10-14 = Moderate depression, 15-19 = Moderately severe depression, 20-27 = Severe depression   Psychosocial Evaluation and Intervention:   Psychosocial Re-Evaluation:   Psychosocial Discharge (Final Psychosocial Re-Evaluation):   Education: Education Goals: Education classes will be provided on a weekly basis, covering required topics. Participant will  state understanding/return demonstration of topics presented.  Learning Barriers/Preferences:  Learning Barriers/Preferences - 07/26/24 1419       Learning Barriers/Preferences   Learning Barriers None    Learning Preferences Skilled Demonstration          General Pulmonary Education Topics:  Infection Prevention: - Provides verbal and written material to individual with discussion of infection control including proper hand washing and proper equipment cleaning during exercise session. Flowsheet Row Pulmonary Rehab from  07/28/2024 in [facility] Cardiac and Pulmonary Rehab  Date 07/28/24  Educator MB  Instruction Review Code 1- Verbalizes Understanding    Falls Prevention: - Provides verbal and written material to individual with discussion of falls prevention and safety. Flowsheet Row Pulmonary Rehab from 07/28/2024 in [facility] Cardiac and Pulmonary Rehab  Date 07/28/24  Educator MB  Instruction Review Code 1- Verbalizes Understanding    Chronic Lung Disease Review: - Group verbal instruction with posters, models, PowerPoint presentations and videos,  to review new updates, new respiratory medications, new advancements in procedures and treatments. Providing information on websites and 800 numbers for continued self-education. Includes information about supplement oxygen, available portable oxygen systems, continuous and intermittent flow rates, oxygen safety, concentrators, and Medicare reimbursement for oxygen. Explanation of Pulmonary Drugs, including class, frequency, complications, importance of spacers, rinsing mouth after steroid MDI's, and proper cleaning methods for nebulizers. Review of basic lung anatomy and physiology related to function, structure, and complications of lung disease. Review of risk factors. Discussion about methods for diagnosing sleep apnea and types of masks and machines for OSA. Includes a review of the use of types of environmental controls: home humidity, furnaces, filters, dust mite/pet prevention, HEPA vacuums. Discussion about weather changes, air quality and the benefits of nasal washing. Instruction on Warning signs, infection symptoms, calling MD promptly, preventive modes, and value of vaccinations. Review of effective airway clearance, coughing and/or vibration techniques. Emphasizing that all should Create an Action Plan. Written material provided at class time. Flowsheet Row Pulmonary Rehab from 07/28/2024 in [facility] Cardiac and Pulmonary Rehab  Education need identified 07/28/24     AED/CPR: - Group verbal and written instruction with the use of models to demonstrate the basic use of the AED with the basic ABC's of resuscitation.    Tests and Procedures:  - Group verbal and visual presentation and models provide information about basic cardiac anatomy and function. Reviews the testing methods done to diagnose heart disease and the outcomes of the test results. Describes the treatment choices: Medical Management, Angioplasty, or Coronary Bypass Surgery for treating various heart conditions including Myocardial Infarction, Angina, Valve Disease, and Cardiac Arrhythmias.  Written material provided at class time. Flowsheet Row Cardiac Rehab from 08/08/2022 in [facility] Cardiac and Pulmonary Rehab  Date 07/25/22  Educator SB  Instruction Review Code 1- Verbalizes Understanding    Medication Safety: - Group verbal and visual instruction to review commonly prescribed medications for heart and lung disease. Reviews the medication, class of the drug, and side effects. Includes the steps to properly store meds and maintain the prescription regimen.  Written material given at graduation.   Other: -Provides group and verbal instruction on various topics (see comments)   Knowledge Questionnaire Score:  Knowledge Questionnaire Score - 07/28/24 1531       Knowledge Questionnaire Score   Pre Score 13/18           Core Components/Risk Factors/Patient Goals at Admission:  Personal Goals and Risk Factors at Admission - 07/28/24 1533  Core Components/Risk Factors/Patient Goals on Admission    Weight Management Yes;Weight Loss    Intervention Weight Management: Develop a combined nutrition and exercise program designed to reach desired caloric intake, while maintaining appropriate intake of nutrient and fiber, sodium and fats, and appropriate energy expenditure required for the weight goal.;Weight Management: Provide education and appropriate resources to help participant  work on and attain dietary goals.;Weight Management/Obesity: Establish reasonable short term and long term weight goals.    Admit Weight 267 lb (121.1 kg)    Goal Weight: Short Term 248 lb 8 oz (112.7 kg)    Goal Weight: Long Term 230 lb (104.3 kg)    Expected Outcomes Short Term: Continue to assess and modify interventions until short term weight is achieved;Weight Loss: Understanding of general recommendations for a balanced deficit meal plan, which promotes 1-2 lb weight loss per week and includes a negative energy balance of [id number] kcal/d;Understanding recommendations for meals to include 15-35% energy as protein, 25-35% energy from fat, 35-60% energy from carbohydrates, less than 200mg  of dietary cholesterol, 20-35 gm of total fiber daily;Understanding of distribution of calorie intake throughout the day with the consumption of 4-5 meals/snacks;Long Term: Adherence to nutrition and physical activity/exercise program aimed toward attainment of established weight goal    Improve shortness of breath with ADL's Yes    Intervention Provide education, individualized exercise plan and daily activity instruction to help decrease symptoms of SOB with activities of daily living.    Expected Outcomes Short Term: Improve cardiorespiratory fitness to achieve a reduction of symptoms when performing ADLs;Long Term: Be able to perform more ADLs without symptoms or delay the onset of symptoms    Diabetes Yes    Intervention Provide education about signs/symptoms and action to take for hypo/hyperglycemia.;Provide education about proper nutrition, including hydration, and aerobic/resistive exercise prescription along with prescribed medications to achieve blood glucose in normal ranges: Fasting glucose 65-99 mg/dL    Expected Outcomes Short Term: Participant verbalizes understanding of the signs/symptoms and immediate care of hyper/hypoglycemia, proper foot care and importance of medication, aerobic/resistive exercise  and nutrition plan for blood glucose control.;Long Term: Attainment of HbA1C < 7%.    Heart Failure Yes    Intervention Provide a combined exercise and nutrition program that is supplemented with education, support and counseling about heart failure. Directed toward relieving symptoms such as shortness of breath, decreased exercise tolerance, and extremity edema.    Expected Outcomes Improve functional capacity of life;Short term: Attendance in program 2-3 days a week with increased exercise capacity. Reported lower sodium intake. Reported increased fruit and vegetable intake. Reports medication compliance.;Short term: Daily weights obtained and reported for increase. Utilizing diuretic protocols set by physician.;Long term: Adoption of self-care skills and reduction of barriers for early signs and symptoms recognition and intervention leading to self-care maintenance.    Hypertension Yes    Intervention Provide education on lifestyle modifcations including regular physical activity/exercise, weight management, moderate sodium restriction and increased consumption of fresh fruit, vegetables, and low fat dairy, alcohol moderation, and smoking cessation.;Monitor prescription use compliance.    Expected Outcomes Short Term: Continued assessment and intervention until BP is < 140/67mm HG in hypertensive participants. < 130/80mm HG in hypertensive participants with diabetes, heart failure or chronic kidney disease.;Long Term: Maintenance of blood pressure at goal levels.    Lipids Yes    Intervention Provide education and support for participant on nutrition & aerobic/resistive exercise along with prescribed medications to achieve LDL 70mg , HDL >40mg .    Expected  Outcomes Short Term: Participant states understanding of desired cholesterol values and is compliant with medications prescribed. Participant is following exercise prescription and nutrition guidelines.;Long Term: Cholesterol controlled with  medications as prescribed, with individualized exercise RX and with personalized nutrition plan. Value goals: LDL < 70mg , HDL > 40 mg.          Education:Diabetes - Individual verbal and written instruction to review signs/symptoms of diabetes, desired ranges of glucose level fasting, after meals and with exercise. Acknowledge that pre and post exercise glucose checks will be done for 3 sessions at entry of program. Flowsheet Row Pulmonary Rehab from 07/28/2024 in [facility] Cardiac and Pulmonary Rehab  Date 07/28/24  Educator MB  Instruction Review Code 1- Verbalizes Understanding    Know Your Numbers and Heart Failure: - Group verbal and visual instruction to discuss disease risk factors for cardiac and pulmonary disease and treatment options.  Reviews associated critical values for Overweight/Obesity, Hypertension, Cholesterol, and Diabetes.  Discusses basics of heart failure: signs/symptoms and treatments.  Introduces Heart Failure Zone chart for action plan for heart failure. Written material provided at class time. Flowsheet Row Cardiac Rehab from 08/08/2022 in [facility] Cardiac and Pulmonary Rehab  Education need identified 07/01/22  Date 08/08/22  Educator SB  Instruction Review Code 1- Verbalizes Understanding    Core Components/Risk Factors/Patient Goals Review:    Core Components/Risk Factors/Patient Goals at Discharge (Final Review):    ITP Comments:  ITP Comments     Row Name 07/26/24 1424 07/28/24 1512 08/03/24 0731 08/04/24 1315     ITP Comments Virtual Visit completed. Patient informed on EP and RD appointment and 6 Minute walk test. Patient also informed of patient health questionnaires on My Chart. Patient Verbalizes understanding. Visit diagnosis can be found in [facility] 06/28/2024. Completed and gym orientation for respiratory care services. Initial ITP created and sent for review to Dr. Fuad Aleskerov, Medical Director. First full day of exercise!  Patient was oriented to  gym and equipment including functions, settings, policies, and procedures.  Patient's individual exercise prescription and treatment plan were reviewed.  All starting workloads were established based on the results of the 6 minute walk test done at initial orientation visit.  The plan for exercise progression was also introduced and progression will be customized based on patient's performance and goals. 30 Day review completed. Medical Director ITP review done, changes made as directed, and signed approval by Medical Director. New to program       Comments: 30 Day Review      [1]  Current Outpatient Medications:    Accu-Chek Softclix Lancets lancets, USE UP TO FOUR TIMES DAILY, Disp: , Rfl:    albuterol  (VENTOLIN  HFA) 108 (90 Base) MCG/ACT inhaler, Inhale 2 puffs into the lungs every 4 (four) hours as needed for wheezing or shortness of breath., Disp: 8 g, Rfl: 6   allopurinol  (ZYLOPRIM ) 300 MG tablet, Take 300 mg by mouth every morning., Disp: , Rfl:    amiodarone  (PACERONE ) 200 MG tablet, Take 1 tablet (200 mg total) by mouth daily., Disp: 90 tablet, Rfl: 3   BD PEN NEEDLE NANO 2ND GEN 32G X 4 MM MISC, USE  AS DIRECTED AT BEDTIME, Disp: 200 each, Rfl: 0   blood glucose meter kit and supplies KIT, Dispense based on patient and insurance preference. Use up to four times daily as directed., Disp: 1 each, Rfl: 11   Bumetanide  (ENBUMYST ) 0.5 MG/0.1ML SOLN, Place 1 mg into the nose daily as needed (for weight gain  with shortness of breath and swelling)., Disp: 24 each, Rfl: 1   carvedilol  (COREG ) 3.125 MG tablet, Take 1 tablet (3.125 mg total) by mouth 2 (two) times daily with a meal., Disp: 60 tablet, Rfl: 3   Continuous Glucose Sensor (FREESTYLE LIBRE 3 PLUS SENSOR) MISC, Place 1 sensor on the skin every 15 days. Use to check glucose continuously, Disp: 2 each, Rfl: 12   dapagliflozin  propanediol (FARXIGA ) 10 MG TABS tablet, Take 1 tablet (10 mg total) by mouth daily., Disp: 90 tablet, Rfl: 3    Digoxin  62.5 MCG TABS, Take 0.0625 mg by mouth daily., Disp: 30 tablet, Rfl: 5   ezetimibe  (ZETIA ) 10 MG tablet, Take 1 tablet (10 mg total) by mouth daily., Disp: 90 tablet, Rfl: 3   hydrALAZINE  (APRESOLINE ) 25 MG tablet, Take 1 tablet (25 mg total) by mouth 2 (two) times daily., Disp: , Rfl:    insulin  glargine (LANTUS  SOLOSTAR) 100 UNIT/ML Solostar Pen, Inject 55 Units into the skin daily. Decrease by 2 units every 2 days if fasting blood glucose is less than 90., Disp: 15 mL, Rfl: 11   insulin  lispro (HUMALOG ) 100 UNIT/ML KwikPen, Inject 20 Units into the skin 3 (three) times daily. + CORRECTION BEFORE BREAKFAST/LUNCH AND SUPPER (CORRECTION = 1 UNIT PER 50 MG/DL ABOVE 849); MAX DAILY DOSE 77 UNITS, Disp: 15 mL, Rfl: 11   Insulin  Pen Needle 32G X 4 MM MISC, USE WITH INSULIN  IN THE MORNING, AT NOON, IN THE EVENING AND AT BEDTIME, Disp: 400 each, Rfl: 11   ipratropium (ATROVENT ) 0.06 % nasal spray, Place 2 sprays into both nostrils every 8 (eight) hours., Disp: , Rfl:    ipratropium-albuterol  (DUONEB) 0.5-2.5 (3) MG/3ML SOLN, Take 3 mLs by nebulization every 6 (six) hours as needed., Disp: 360 mL, Rfl: 11   meclizine  (ANTIVERT ) 25 MG tablet, Take 0.5 tablets (12.5 mg total) by mouth 3 (three) times daily as needed for dizziness., Disp: , Rfl:    nitroGLYCERIN  (NITROSTAT ) 0.4 MG SL tablet, Place 1 tablet (0.4 mg total) under the tongue every 5 (five) minutes x 3 doses as needed for chest pain., Disp: 30 tablet, Rfl: 3   omeprazole  (PRILOSEC) 40 MG capsule, Take 1 capsule (40 mg total) by mouth daily., Disp: 90 capsule, Rfl: 2   potassium chloride  SA (KLOR-CON  M) 20 MEQ tablet, Take 1 tablet (20 mEq total) by mouth daily. AM, Disp: 90 tablet, Rfl: 3   rivaroxaban  (XARELTO ) 20 MG TABS tablet, Take 1 tablet (20 mg total) by mouth daily with supper., Disp: 90 tablet, Rfl: 3   rosuvastatin  (CRESTOR ) 20 MG tablet, Take 1 tablet (20 mg total) by mouth daily., Disp: 90 tablet, Rfl: 3   sacubitril -valsartan   (ENTRESTO ) 24-26 MG, Take 1 tablet by mouth 2 (two) times daily., Disp: , Rfl:    tirzepatide  (MOUNJARO ) 15 MG/0.5ML Pen, Inject 15 mg into the skin once a week., Disp: 6 mL, Rfl: 3   torsemide  (DEMADEX ) 20 MG tablet, Take 60 mg by mouth daily., Disp: , Rfl:    Torsemide  60 MG TABS, Take 60 mg by mouth daily., Disp: 90 tablet, Rfl: 0   Vericiguat (VERQUVO) 2.5 MG TABS, Take 1 tablet (2.5 mg total) by mouth daily., Disp: 30 tablet, Rfl: 5 [2]  Social History Tobacco Use  Smoking Status Former   Current packs/day: 0.00   Average packs/day: 0.5 packs/day for 33.0 years (16.5 ttl pk-yrs)   Types: Cigarettes   Start date: 05/09/1987   Quit date: 05/08/2020   Years  since quitting: 4.2  Smokeless Tobacco Former   Quit date: 05/08/2020  Tobacco Comments   Quit Sept 2021

## 2024-08-04 NOTE — Patient Instructions (Addendum)
 Medication Changes:  BEGIN Entresto  24/26 MG Two times daily BEGIN Verquvo 2.5 MG Once daily  Digoxin  Refill sent to your pharmacy    Follow-Up in: 3 weeks with Ellouise Class, NP    If you have any questions or concerns before your next appointment please send us  a message through [place] or call our office at [phone], If it is after office hours your call will be answered by our answering service and directed appropriately.     At the [facility], you and your health needs are our priority. We have a designated team specialized in the treatment of Heart Failure. This Care Team includes your primary Heart Failure Specialized Cardiologist (physician), Advanced Practice Providers (APPs- Physician Assistants and Nurse Practitioners), and Pharmacist who all work together to provide you with the care you need, when you need it.   You may see any of the following providers on your designated Care Team at your next follow up:  Dr. Toribio Fuel Dr. Ezra Shuck Dr. Ria Commander Dr. Odis Brownie Greig Mosses, NP Caffie Shed, [place] [address] [place], [place] Beckey Coe, NP Jordan Lee, NP Ellouise Class, NP Jaun Bash, PharmD

## 2024-08-05 ENCOUNTER — Encounter

## 2024-08-05 DIAGNOSIS — I5022 Chronic systolic (congestive) heart failure: Secondary | ICD-10-CM

## 2024-08-05 LAB — GLUCOSE, CAPILLARY
Glucose-Capillary: 214 mg/dL — ABNORMAL HIGH (ref 70–99)
Glucose-Capillary: 204 mg/dL — ABNORMAL HIGH (ref 70–99)

## 2024-08-05 NOTE — Telephone Encounter (Signed)
 Advanced Heart Failure Patient Advocate Encounter  Prior authorization for [place] has been submitted and approved. Test billing returns $0 for 30 day supply.  Key: [id number] Effective: 08/05/2024 to 08/18/2025  Rachel DEL, CPhT Rx Patient Advocate Phone: [phone]

## 2024-08-05 NOTE — Progress Notes (Signed)
 Daily Session Note  Patient Details  Name: Gabriel [name]. MRN: [id number] Date of Birth: Feb 15, 1971 Referring Provider:   Flowsheet Row Pulmonary Rehab from 07/28/2024 in [facility] Cardiac and Pulmonary Rehab  Referring Provider Cherrie Sieving, MD    Encounter Date: 08/05/2024  Check In:  Session Check In - 08/05/24 0744       Check-In   Supervising physician immediately available to respond to emergencies See telemetry face sheet for immediately available ER MD    Location [facility]-Cardiac & Pulmonary Rehab    Staff Present Leita Franks RN,BSN;Joseph Rolinda RCP,RRT,BSRT;Jason Elnor RDN,LDN    Virtual Visit No    Medication changes reported     No    Fall or balance concerns reported    No    Warm-up and Cool-down Performed on first and last piece of equipment    Resistance Training Performed Yes    VAD Patient? No    PAD/SET Patient? No      Pain Assessment   Currently in Pain? No/denies             Tobacco Use History[1]  Goals Met:  Proper associated with RPD/PD & O2 Sat Independence with exercise equipment Using PLB without cueing & demonstrates good technique Exercise tolerated well No report of concerns or symptoms today Strength training completed today  Goals Unmet:  Not Applicable  Comments: Pt able to follow exercise prescription today without complaint.  Will continue to monitor for progression.    Dr. Oneil Pinal is Medical Director for [facility] Cardiac Rehabilitation.  Dr. Fuad Aleskerov is Medical Director for [facility] Pulmonary Rehabilitation.    [1]  Social History Tobacco Use  Smoking Status Former   Current packs/day: 0.00   Average packs/day: 0.5 packs/day for 33.0 years (16.5 ttl pk-yrs)   Types: Cigarettes   Start date: 05/09/1987   Quit date: 05/08/2020   Years since quitting: 4.2  Smokeless Tobacco Former   Quit date: 05/08/2020  Tobacco Comments   Quit Sept 2021

## 2024-08-06 ENCOUNTER — Ambulatory Visit: Admitting: Family Medicine

## 2024-08-10 ENCOUNTER — Other Ambulatory Visit: Payer: Self-pay

## 2024-08-10 ENCOUNTER — Encounter

## 2024-08-10 DIAGNOSIS — I5022 Chronic systolic (congestive) heart failure: Secondary | ICD-10-CM

## 2024-08-10 LAB — GLUCOSE, CAPILLARY: Glucose-Capillary: 322 mg/dL — ABNORMAL HIGH (ref 70–99)

## 2024-08-10 MED FILL — Continuous Glucose System Sensor: 30 days supply | Qty: 2 | Fill #6 | Status: AC

## 2024-08-10 NOTE — Progress Notes (Signed)
 Incomplete Session Note  Patient Details  Name: Gabriel [name]. MRN: [id number] Date of Birth: September 28, 1970 Referring Provider:   Flowsheet Row Pulmonary Rehab from 07/28/2024 in [facility] Cardiac and Pulmonary Rehab  Referring Provider Cherrie Sieving, MD    [name] did not complete his rehab session.  His BG was 322. Patient was educated by Leita, RN, to drink water , take sliding scale insulin  as prescribed, and notify doctor if BG does not go down. Patient stated understanding.

## 2024-08-17 ENCOUNTER — Ambulatory Visit (INDEPENDENT_AMBULATORY_CARE_PROVIDER_SITE_OTHER): Payer: Medicaid Other

## 2024-08-17 ENCOUNTER — Encounter

## 2024-08-17 DIAGNOSIS — I5022 Chronic systolic (congestive) heart failure: Secondary | ICD-10-CM | POA: Diagnosis not present

## 2024-08-18 LAB — CUP PACEART REMOTE DEVICE CHECK
Battery Remaining Percentage: 75 %
Date Time Interrogation Session: 20251230164100
HighPow Impedance: 90 Ohm
Implantable Lead Connection Status: 753985
Implantable Lead Implant Date: 20231002
Implantable Lead Location: 753862
Implantable Lead Model: 3501
Implantable Lead Serial Number: 221766
Implantable Pulse Generator Implant Date: 20231002
Pulse Gen Serial Number: 189685

## 2024-08-19 ENCOUNTER — Encounter

## 2024-08-22 ENCOUNTER — Ambulatory Visit: Payer: Self-pay | Admitting: Cardiology

## 2024-08-24 ENCOUNTER — Encounter: Attending: Internal Medicine

## 2024-08-24 DIAGNOSIS — Z79899 Other long term (current) drug therapy: Secondary | ICD-10-CM | POA: Diagnosis not present

## 2024-08-24 DIAGNOSIS — G4733 Obstructive sleep apnea (adult) (pediatric): Secondary | ICD-10-CM | POA: Diagnosis not present

## 2024-08-24 DIAGNOSIS — Z48812 Encounter for surgical aftercare following surgery on the circulatory system: Secondary | ICD-10-CM | POA: Insufficient documentation

## 2024-08-24 DIAGNOSIS — R7989 Other specified abnormal findings of blood chemistry: Secondary | ICD-10-CM | POA: Diagnosis not present

## 2024-08-24 DIAGNOSIS — Z7984 Long term (current) use of oral hypoglycemic drugs: Secondary | ICD-10-CM | POA: Diagnosis not present

## 2024-08-24 DIAGNOSIS — I5022 Chronic systolic (congestive) heart failure: Secondary | ICD-10-CM | POA: Diagnosis present

## 2024-08-24 DIAGNOSIS — I130 Hypertensive heart and chronic kidney disease with heart failure and stage 1 through stage 4 chronic kidney disease, or unspecified chronic kidney disease: Secondary | ICD-10-CM | POA: Diagnosis not present

## 2024-08-24 DIAGNOSIS — E1122 Type 2 diabetes mellitus with diabetic chronic kidney disease: Secondary | ICD-10-CM | POA: Diagnosis not present

## 2024-08-24 DIAGNOSIS — Z794 Long term (current) use of insulin: Secondary | ICD-10-CM | POA: Diagnosis not present

## 2024-08-24 DIAGNOSIS — Z86711 Personal history of pulmonary embolism: Secondary | ICD-10-CM | POA: Diagnosis not present

## 2024-08-24 DIAGNOSIS — I3139 Other pericardial effusion (noninflammatory): Secondary | ICD-10-CM | POA: Insufficient documentation

## 2024-08-24 DIAGNOSIS — Z7985 Long-term (current) use of injectable non-insulin antidiabetic drugs: Secondary | ICD-10-CM | POA: Insufficient documentation

## 2024-08-24 DIAGNOSIS — I340 Nonrheumatic mitral (valve) insufficiency: Secondary | ICD-10-CM | POA: Insufficient documentation

## 2024-08-24 DIAGNOSIS — I428 Other cardiomyopathies: Secondary | ICD-10-CM | POA: Diagnosis not present

## 2024-08-24 DIAGNOSIS — I480 Paroxysmal atrial fibrillation: Secondary | ICD-10-CM | POA: Diagnosis not present

## 2024-08-24 DIAGNOSIS — Z7901 Long term (current) use of anticoagulants: Secondary | ICD-10-CM | POA: Diagnosis not present

## 2024-08-24 DIAGNOSIS — N1832 Chronic kidney disease, stage 3b: Secondary | ICD-10-CM | POA: Insufficient documentation

## 2024-08-24 DIAGNOSIS — I272 Pulmonary hypertension, unspecified: Secondary | ICD-10-CM | POA: Insufficient documentation

## 2024-08-24 DIAGNOSIS — I25118 Atherosclerotic heart disease of native coronary artery with other forms of angina pectoris: Secondary | ICD-10-CM | POA: Diagnosis present

## 2024-08-24 DIAGNOSIS — I2510 Atherosclerotic heart disease of native coronary artery without angina pectoris: Secondary | ICD-10-CM | POA: Diagnosis not present

## 2024-08-24 LAB — GLUCOSE, CAPILLARY
Glucose-Capillary: 237 mg/dL — ABNORMAL HIGH (ref 70–99)
Glucose-Capillary: 192 mg/dL — ABNORMAL HIGH (ref 70–99)

## 2024-08-24 NOTE — Progress Notes (Signed)
 Daily Session Note  Patient Details  Name: Gabriel [name]. MRN: [id number] Date of Birth: 1971/05/01 Referring Provider:   Flowsheet Row Pulmonary Rehab from 07/28/2024 in [facility] Cardiac and Pulmonary Rehab  Referring Provider Cherrie Sieving, MD    Encounter Date: 08/24/2024  Check In:  Session Check In - 08/24/24 0726       Check-In   Supervising physician immediately available to respond to emergencies See telemetry face sheet for immediately available ER MD    Location [facility]-Cardiac & Pulmonary Rehab    Staff Present Burnard Davenport RN,BSN,MPA;Noah Tickle, BS, Exercise Physiologist;Margaret Best, MS, Exercise Physiologist;Jason Elnor RDN,LDN    Virtual Visit No    Medication changes reported     No    Fall or balance concerns reported    No    Warm-up and Cool-down Performed on first and last piece of equipment    Resistance Training Performed Yes    VAD Patient? No    PAD/SET Patient? No      Pain Assessment   Currently in Pain? No/denies             Tobacco Use History[1]  Goals Met:  Proper associated with RPD/PD & O2 Sat Independence with exercise equipment Using PLB without cueing & demonstrates good technique Exercise tolerated well Personal goals reviewed No report of concerns or symptoms today Strength training completed today  Goals Unmet:  Not Applicable  Comments: Pt able to follow exercise prescription today without complaint.  Will continue to monitor for progression.    Dr. Oneil Pinal is Medical Director for [facility] Cardiac Rehabilitation.  Dr. Fuad Aleskerov is Medical Director for [facility] Pulmonary Rehabilitation.     [1]  Social History Tobacco Use  Smoking Status Former   Current packs/day: 0.00   Average packs/day: 0.5 packs/day for 33.0 years (16.5 ttl pk-yrs)   Types: Cigarettes   Start date: 05/09/1987   Quit date: 05/08/2020   Years since quitting: 4.2  Smokeless Tobacco Former   Quit date: 05/08/2020  Tobacco  Comments   Quit Sept 2021

## 2024-08-25 NOTE — Progress Notes (Signed)
 Remote ICD Transmission

## 2024-08-26 ENCOUNTER — Ambulatory Visit: Admitting: Student in an Organized Health Care Education/Training Program

## 2024-08-26 ENCOUNTER — Encounter: Payer: Self-pay | Admitting: Family

## 2024-08-26 ENCOUNTER — Ambulatory Visit (HOSPITAL_BASED_OUTPATIENT_CLINIC_OR_DEPARTMENT_OTHER): Admitting: Family

## 2024-08-26 ENCOUNTER — Encounter: Admitting: Emergency Medicine

## 2024-08-26 ENCOUNTER — Encounter: Payer: Self-pay | Admitting: Student in an Organized Health Care Education/Training Program

## 2024-08-26 VITALS — BP 108/62 | HR 83 | Temp 98.7°F | Ht 71.0 in | Wt 262.2 lb

## 2024-08-26 VITALS — BP 88/68 | HR 89 | Wt 264.0 lb

## 2024-08-26 DIAGNOSIS — I130 Hypertensive heart and chronic kidney disease with heart failure and stage 1 through stage 4 chronic kidney disease, or unspecified chronic kidney disease: Secondary | ICD-10-CM | POA: Insufficient documentation

## 2024-08-26 DIAGNOSIS — R0789 Other chest pain: Secondary | ICD-10-CM | POA: Insufficient documentation

## 2024-08-26 DIAGNOSIS — Z87891 Personal history of nicotine dependence: Secondary | ICD-10-CM | POA: Insufficient documentation

## 2024-08-26 DIAGNOSIS — Z794 Long term (current) use of insulin: Secondary | ICD-10-CM | POA: Insufficient documentation

## 2024-08-26 DIAGNOSIS — I8290 Acute embolism and thrombosis of unspecified vein: Secondary | ICD-10-CM

## 2024-08-26 DIAGNOSIS — E1122 Type 2 diabetes mellitus with diabetic chronic kidney disease: Secondary | ICD-10-CM | POA: Insufficient documentation

## 2024-08-26 DIAGNOSIS — Z7985 Long-term (current) use of injectable non-insulin antidiabetic drugs: Secondary | ICD-10-CM | POA: Insufficient documentation

## 2024-08-26 DIAGNOSIS — Z86711 Personal history of pulmonary embolism: Secondary | ICD-10-CM | POA: Insufficient documentation

## 2024-08-26 DIAGNOSIS — G4733 Obstructive sleep apnea (adult) (pediatric): Secondary | ICD-10-CM | POA: Insufficient documentation

## 2024-08-26 DIAGNOSIS — I472 Ventricular tachycardia, unspecified: Secondary | ICD-10-CM | POA: Insufficient documentation

## 2024-08-26 DIAGNOSIS — I2699 Other pulmonary embolism without acute cor pulmonale: Secondary | ICD-10-CM | POA: Diagnosis not present

## 2024-08-26 DIAGNOSIS — L409 Psoriasis, unspecified: Secondary | ICD-10-CM | POA: Insufficient documentation

## 2024-08-26 DIAGNOSIS — Z9889 Other specified postprocedural states: Secondary | ICD-10-CM | POA: Insufficient documentation

## 2024-08-26 DIAGNOSIS — R7989 Other specified abnormal findings of blood chemistry: Secondary | ICD-10-CM | POA: Insufficient documentation

## 2024-08-26 DIAGNOSIS — N183 Chronic kidney disease, stage 3 unspecified: Secondary | ICD-10-CM | POA: Insufficient documentation

## 2024-08-26 DIAGNOSIS — I272 Pulmonary hypertension, unspecified: Secondary | ICD-10-CM | POA: Insufficient documentation

## 2024-08-26 DIAGNOSIS — I25118 Atherosclerotic heart disease of native coronary artery with other forms of angina pectoris: Secondary | ICD-10-CM | POA: Diagnosis not present

## 2024-08-26 DIAGNOSIS — Z95810 Presence of automatic (implantable) cardiac defibrillator: Secondary | ICD-10-CM | POA: Insufficient documentation

## 2024-08-26 DIAGNOSIS — I2722 Pulmonary hypertension due to left heart disease: Secondary | ICD-10-CM

## 2024-08-26 DIAGNOSIS — R0602 Shortness of breath: Secondary | ICD-10-CM | POA: Diagnosis not present

## 2024-08-26 DIAGNOSIS — K7460 Unspecified cirrhosis of liver: Secondary | ICD-10-CM | POA: Insufficient documentation

## 2024-08-26 DIAGNOSIS — I10 Essential (primary) hypertension: Secondary | ICD-10-CM

## 2024-08-26 DIAGNOSIS — K7581 Nonalcoholic steatohepatitis (NASH): Secondary | ICD-10-CM | POA: Insufficient documentation

## 2024-08-26 DIAGNOSIS — I428 Other cardiomyopathies: Secondary | ICD-10-CM | POA: Insufficient documentation

## 2024-08-26 DIAGNOSIS — J4489 Other specified chronic obstructive pulmonary disease: Secondary | ICD-10-CM | POA: Insufficient documentation

## 2024-08-26 DIAGNOSIS — I4891 Unspecified atrial fibrillation: Secondary | ICD-10-CM | POA: Insufficient documentation

## 2024-08-26 DIAGNOSIS — I5022 Chronic systolic (congestive) heart failure: Secondary | ICD-10-CM

## 2024-08-26 DIAGNOSIS — I2510 Atherosclerotic heart disease of native coronary artery without angina pectoris: Secondary | ICD-10-CM | POA: Insufficient documentation

## 2024-08-26 DIAGNOSIS — I255 Ischemic cardiomyopathy: Secondary | ICD-10-CM | POA: Insufficient documentation

## 2024-08-26 DIAGNOSIS — Z7984 Long term (current) use of oral hypoglycemic drugs: Secondary | ICD-10-CM | POA: Insufficient documentation

## 2024-08-26 DIAGNOSIS — I480 Paroxysmal atrial fibrillation: Secondary | ICD-10-CM | POA: Diagnosis not present

## 2024-08-26 DIAGNOSIS — Z79899 Other long term (current) drug therapy: Secondary | ICD-10-CM | POA: Insufficient documentation

## 2024-08-26 DIAGNOSIS — N1832 Chronic kidney disease, stage 3b: Secondary | ICD-10-CM

## 2024-08-26 DIAGNOSIS — Z7901 Long term (current) use of anticoagulants: Secondary | ICD-10-CM | POA: Insufficient documentation

## 2024-08-26 LAB — GLUCOSE, CAPILLARY: Glucose-Capillary: 228 mg/dL — ABNORMAL HIGH (ref 70–99)

## 2024-08-26 MED ORDER — ALBUTEROL SULFATE HFA 108 (90 BASE) MCG/ACT IN AERS: 2.0000 | INHALATION_SPRAY | RESPIRATORY_TRACT | 6 refills | Status: AC | PRN

## 2024-08-26 NOTE — Patient Instructions (Signed)
" °  YOUR PLAN: -CHRONIC HEART FAILURE WITH REDUCED EJECTION FRACTION: Chronic heart failure with reduced ejection fraction means your heart is not pumping blood as well as it should. Continue with your current heart failure medications and participate in cardiac rehab and exercise as recommended.  -PULMONARY HYPERTENSION: Pulmonary hypertension is high blood pressure in the arteries of your lungs. This is secondary to the heart failure above  -ATRIAL FIBRILLATION: Atrial fibrillation is an irregular and often rapid heart rate. Continue taking amiodarone  for managing this condition and follow up with electrophysiology for a potential ablation evaluation.  -Sleep Apnea: We will schedule you for follow up with one of our sleep doctors  INSTRUCTIONS: Continue monitoring your weight and adjusting your medication as needed. Use the albuterol  inhaler as needed for wheezing and participate in cardiac rehab and exercise as recommended. Schedule follow up with our sleep medicine specialist Dr. Jess.                      Contains text generated by [employer]bridge.                                 Contains text generated by Abridge.   "

## 2024-08-26 NOTE — Progress Notes (Signed)
 Daily Session Note  Patient Details  Name: Gabriel [name]. MRN: [id number] Date of Birth: 1971-05-18 Referring Provider:   Flowsheet Row Pulmonary Rehab from 07/28/2024 in [facility] Cardiac and Pulmonary Rehab  Referring Provider Cherrie Sieving, MD    Encounter Date: 08/26/2024  Check In:  Session Check In - 08/26/24 0728       Check-In   Supervising physician immediately available to respond to emergencies See telemetry face sheet for immediately available ER MD    Location [facility]-Cardiac & Pulmonary Rehab    Staff Present Leita Franks RN,BSN;[facility] RCP,RRT,BSRT;Noah Tickle, [place], Exercise Physiologist;Jason Elnor [facility]    Virtual Visit No    Medication changes reported     No    Fall or balance concerns reported    No    Warm-up and Cool-down Performed on first and last piece of equipment    Resistance Training Performed Yes    VAD Patient? No    PAD/SET Patient? No      Pain Assessment   Currently in Pain? No/denies             Tobacco Use History[1]  Goals Met:  Proper associated with RPD/PD & O2 Sat Independence with exercise equipment Using PLB without cueing & demonstrates good technique Exercise tolerated well No report of concerns or symptoms today Strength training completed today  Goals Unmet:  Not Applicable  Comments: Patient experienced episode of dizziness and general weakness while walking on the treadmill. BP checked and was 100/60, HR 106. O2 97%. Patient's blood sugar was 220. Patient sat and drank water  and ate a snake. Repeat BP 96/58. Patient reports some improvement of dizziness but states he does feel short of breath. Patient reports he has a doctor's appointment this morning and wants to leave early to walk to that. Patient ambulatory out of gym and accompanied by staff to his doctor's appointment.     Dr. Oneil Pinal is Medical Director for [facility] Cardiac Rehabilitation.  Dr. Fuad Aleskerov is Medical Director for [facility]  Pulmonary Rehabilitation.     [1]  Social History Tobacco Use  Smoking Status Former   Current packs/day: 0.00   Average packs/day: 0.5 packs/day for 33.0 years (16.5 ttl pk-yrs)   Types: Cigarettes   Start date: 05/09/1987   Quit date: 05/08/2020   Years since quitting: 4.3  Smokeless Tobacco Former   Quit date: 05/08/2020  Tobacco Comments   Quit Sept 2021

## 2024-08-26 NOTE — Patient Instructions (Signed)
 Medication Changes:  HOLD you evening dose of Hydralazine . You may resume your normal dose tomorrow morning.    Follow-Up in: Please follow up with the [facility] in 1 month with Ellouise Class, FNP.   Thank you for choosing [facility].    At the [facility], you and your health needs are our priority. We have a designated team specialized in the treatment of Heart Failure. This Care Team includes your primary Heart Failure Specialized Cardiologist (physician), Advanced Practice Providers (APPs- Physician Assistants and Nurse Practitioners), and Pharmacist who all work together to provide you with the care you need, when you need it.   You may see any of the following providers on your designated Care Team at your next follow up:  Dr. Toribio Fuel Dr. Ezra Shuck Dr. Ria Commander Dr. Morene Brownie Ellouise Class, FNP Jaun Bash, RPH-CPP  Please be sure to bring in all your medications bottles to every appointment.   Need to Contact Us :  If you have any questions or concerns before your next appointment please send us  a message through [place] or call our office at [phone].    TO LEAVE A MESSAGE FOR THE NURSE SELECT OPTION 2, PLEASE LEAVE A MESSAGE INCLUDING: YOUR NAME DATE OF BIRTH CALL BACK NUMBER REASON FOR CALL**this is important as we prioritize the call backs  YOU WILL RECEIVE A CALL BACK THE SAME DAY AS LONG AS YOU CALL BEFORE 4:00 PM

## 2024-08-26 NOTE — Progress Notes (Signed)
 " Assessment & Plan  #Chronic HFrEF on GDMT #Group II Pulmonary Hypertension #VTE on Rivaroxaban  #Shortness of Breath  Presents for follow up of shortness of breath in the setting of known HFrEF on GDMT as well as OSA on BiPAP. Continues to experience exertional dyspnea. Most recent RHC showed decompensated heart failure with elevated filling pressures and normal pulmonary vascular resistance, consistent with group II pulmonary hypertension.  PFT's showed mild spirometry, no COPD, and a mild reduction in DLCO. He was trialed on LAMA/LABA therapy, without any difference in symptoms, which is expected given lack of findings of obstruction or COPD on PFT's.  Previous imaging had shown increased interstitial markings with fluid in the fissure overall suggestive of decompensated heart failure and pulmonary edema. Multiple right heart catheterizations also show this. Previous elevation in PVR could have been partially driven by uncontrolled sleep apnea, but he's been on BiPAP with improvement.   He is also on Ixekizumab which is an interleukin-17 inhibitor use for the treatment of psoriasis.  There are case reports of Ixekizumab induced interstitial lung disease (with NSIP as underlying histopathology).  I do not suspect that this is the case given his symptoms predate all of this and the fact that he has had a CT scans that did not show signs of ILD.    - Continue GDMT for heart failure management. - Encouraged participation in cardiac rehab and exercise. - albuterol  (VENTOLIN  HFA) 108 (90 Base) MCG/ACT inhaler; Inhale 2 puffs into the lungs every 4 (four) hours as needed for wheezing or shortness of breath.  Dispense: 8 g; Refill: 6  #OSA on BIPAP  Patient on BiPAP for OSA, and was previously established with sleep medicine. He will attempt to establish care with Dr. Jess in our clinic for continued management of his sleep disordered breathing.  -Continue BiPAP -Establish with sleep  medicine   Return in about 1 year (around 08/26/2025).  Belva November, MD [facility]  I spent 35 minutes caring for this patient today, including preparing to see the patient, obtaining a medical history , reviewing a separately obtained history, performing a medically appropriate examination and/or evaluation, counseling and educating the patient/family/caregiver, ordering medications, tests, or procedures, documenting clinical information in the electronic health record, and independently interpreting results (not separately reported/billed) and communicating results to the patient/family/caregiver  End of visit medications:  Meds ordered this encounter  Medications   albuterol  (VENTOLIN  HFA) 108 (90 Base) MCG/ACT inhaler    Sig: Inhale 2 puffs into the lungs every 4 (four) hours as needed for wheezing or shortness of breath.    Dispense:  8 g    Refill:  6    Current Medications[1]   Subjective:   PATIENT ID: Gabriel [name]. GENDER: male DOB: Jun 27, 1971, MRN: [id number]  Chief Complaint  Patient presents with   Shortness of Breath    SOB. Wheezing. Occasional cough.  DuoNeb- PRN.  Albuterol - PRN.    HPI  Discussed the use of AI scribe software for clinical note transcription with the patient, who gave verbal consent to proceed.  History of Present Illness Gabriel Kneece Isaac Dubie. is a 54 year old male with chronic heart failure, pulmonary hypertension, and obstructive sleep apnea who presents for follow-up of his cardiac and pulmonary conditions.  Return Visit 07/08/2023:  Patient continues to have symptom burden that includes shortness of breath especially with exertion as well as dizziness.  He feels dizzy whenever he stands up or was about doing much  exertion.  He feels that something is not right with his body and is very different from his baseline.  He does have an occasional cough that is nonproductive.  He denies any chest tightness but does  report occasional chest pains on the right side of his chest.  He does not have any fevers, chills, night sweats, or weight loss.  He denies any worsening lower extremity edema or increased abdominal fullness.  He is compliant with his medications and tries to be as compliant as he can with his BiPAP machine, but is unable to use it every night as it feels claustrophobic. On the days he does use it, he does feel somewhat rested in the morning. He continues opn Anoro Ellipta  which hasn't provided any significant improvement in his symptoms.   Since our last meeting, he is followed up with rheumatology (at [facility]) and is currently maintained Ixekizumab and allopurinol .  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  He continues on rivaroxaban  for history of unprovoked pulmonary embolism (with recurrence) and was last seen by oncology on October 7.  He is also followed by advanced heart failure and was last seen in the heart failure clinic yesterday on 11/18 with goal-directed medical therapy optimized.   He did present to the ED on 06/09/2023 with a chief complaint of shortness of breath as well as dizziness.  During said visit, a chest x-ray was performed and read as having potential pneumonia for which she was prescribed antibiotics and steroids.  He feels no improvement following stent ED presentation.   During a previous visit, we reviewed a sleep study he had (split night with titration) and we started him on BiPAP (Auto-IPAP, EPAP of 12, no back up rate). AHI had improved to 10 with therapy during a previous visit and review of use.  Today, I reviewed his compliance report and it is still suboptimal.  Continued use of BiPAP is notable to decrease his AHI.  He has an upcoming visit with my colleague Dr. Neda on Thursday for a sleep  consultation.   Patient reports that he has been short of breath with exertion since 2016 when he had his heart attack.  Over the past 8 years he has been experiencing exertional dyspnea with minimal activity. He reports an occasional cough that is dry and nonproductive.  He sometimes feels chest tightness but denies any wheezing.  He has had multiple hospitalizations for decompensations of his heart failure. He has been closely followed by cardiology and is on an aggressive regimen for the management of heart failure as well as on Rivaroxaban  for pulmonary embolism. CardioMEMS was not approved by his insurance providers.    He had a RHC in October of 2021 while hospitalized for heart failure showing RA 9, RV 57/10, PA 57/28 (40), PCWP 17, CO 3.1 and CI of 1.3. PVR calculated at 7.4 Wood.  Return Visit 08/26/2024:  He has a history of chronic heart failure with reduced ejection fraction and pulmonary hypertension. He is on guideline-directed medical therapy and uses BiPAP for obstructive sleep apnea. He experiences shortness of breath, particularly during exertion or cardiac rehabilitation, and occasional wheezing, especially when overexerted, though it is not severe.  In December 2024, a right heart catheterization revealed multivessel coronary artery disease and elevated left heart filling pressures. He was hospitalized in March  2025 for shortness of breath and treated for COPD secondary to influenza. Later that month, he was admitted again with atrial fibrillation with rapid ventricular response and was cardioverted. In June 2025, he was hospitalized for decompensated heart failure and was diuresed. A repeat right heart catheterization in November 2025 showed PA pressure of 24, wedge pressure of 22, cardiac output of 4.3, cardiac index of 1.8, and a PVR of 0.4.  In late November 2025, another right heart catheterization showed elevated left and right heart filling pressures with an improved wedge  pressure of 14 and reduced cardiac output while hospitalized. During his last admission in November 2025, he required an infusion of milrinone .  He manages his condition by monitoring his weight and adjusting his medication accordingly. He is concerned about his heart's capacity, stating 'I think whenever I get to doing, I'm doing too much for my heart.'  He does not currently use an inhaler but has been prescribed albuterol  as needed for wheezing. He has not received any recent notifications for refills.   He does have a history of smoking and reports having smoked at least 2 packs a day for over 40 years. He quit in 2021. 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   RHC 05/2020:  RA 9, RV 57/10, PA 57/28 (40), PCWP 17, CO/CI 3.1/1.3, PVR 7.4  RHC 07/2023:  RA 5, RV 56/6, PA 58/32 (41), PCWP 18, CO/CI 4.3/1.9, PVR 5.3  RHC 06/2024: RA 3, RV 36/5, PA 34/15 (24), PCWP 22, CO/CI 4.3/1.8, PVR 0.4   RHC 06/2024: RA 7, RV 45/7, PA 48/24 (32), PCWP 24, CO/CI 4.9/2.1   Ancillary information including prior medications, full medical/surgical/family/social histories, and PFTs (when available) are listed below and have been reviewed.    Review of Systems  Constitutional:  Negative for chills, fever and weight loss.  Respiratory:  Positive for shortness of breath. Negative for cough, hemoptysis, sputum production and wheezing.   Cardiovascular:  Negative for chest pain.     Objective:   Vitals:   08/26/24 0946  BP: 108/62  Pulse: 83  Temp: 98.7 F (37.1 C)  SpO2: 97%  Weight: 262 lb 3.2 oz (118.9 kg)  Height: 5' 11 (1.803 m)   97% on RA BMI Readings from Last 3 Encounters:  08/26/24 36.57 kg/m  08/26/24 36.82 kg/m  08/04/24 36.40 kg/m   Wt Readings from Last 3 Encounters:  08/26/24 262 lb 3.2 oz (118.9 kg)  08/26/24 264 lb (119.7 kg)  08/04/24 261 lb (118.4 kg)    Physical Exam Constitutional:       Appearance: He is well-developed. He is obese.  Cardiovascular:     Rate and Rhythm: Normal rate and regular rhythm.     Pulses: Normal pulses.     Heart sounds: Normal heart sounds.  Pulmonary:     Effort: Pulmonary effort is normal. No respiratory distress.     Breath sounds: Normal breath sounds. No wheezing or rales.  Neurological:     General: No focal deficit present.     Mental Status: He is alert and oriented to person, place, and time. Mental status is at baseline.    Ancillary Information    Past Medical History:  Diagnosis Date   Anginal pain    Aortic atherosclerosis    Aortic root dilation 11/28/2016   a.) TTE 11/28/2016: 43 mm; b.) TTE 11/23/2019: 38 mm; c.) TTE 08/14/2022: 40 mm; d.) TTE 03/08/2023:  43 mm; e.) TTE 01/02/2024: 40 mm   Arteriosclerosis of right carotid artery    a.) carotid doppler 04/29/2017: <50% RICA   Atrial fibrillation with RVR (HCC)    a.) CHA2DS2VASc = 6 (HFrEF, HTN, CVA x 2, vascular disease, T2DM) as of 01/27/2024; b.) s/p DCCV 11/06/2023 --> 200 J x 1 -->  NSR; c.) rate/rhythm maintained on oral amiodarone  + carvedilol ; chronically anticoagulated with rivaroxaban    CAD (coronary artery disease)    a.) MV 04/2015: low risk; b.) LHC 01/06/2017: minor irregs in LAD/Diag/LCX/OM, RCA 40p/m/d; c.) R/LHC 06/13/2020: 50% dLAD, 30% p-mLCx, 40% OM1, 100% pRCA (L-R collats) - med mgmt; d.) [facility] 08/19/2023: 50% dLAD, 30% p-mLCx, 40% OM1, 100% pRCA (L-R collats), 50% p-mRCA - med mgmt   Cardiomegaly    Cerebellar infarct (HCC)    a.) brain MRI 06/23/2023: tiny chronic infarct within the RIGHT cerebellar hemisphere; unsure of timing of neurological event; no deficits   Chest wall pain, chronic    Chronic 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    Chronic Troponin Elevation    CKD (chronic kidney disease), stage II-III    COPD (chronic obstructive pulmonary disease) (HCC)    CPAP (continuous positive airway pressure) dependence 03/08/2023   Depression    DM  (diabetes mellitus), type 2 (HCC)    Dyspnea    GERD (gastroesophageal reflux disease)    Headache    HFrEF (heart failure with reduced ejection fraction) (HCC)    History of 2019 novel coronavirus disease (COVID-19) 03/01/2022   History of kidney stones    History of methicillin resistant staphylococcus aureus (MRSA)    Hyperlipidemia    Hypertension    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 12/2015 noted on tele-->amio;  b. 12/2015 Event monitor: no VT noted.   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  12/31/19: RUL and RLL  PEs.   Secondary polycythemia    a.) negative for JAK2 with reflex and BCR-ABL mutations; seeing hematology  with etiology felt to be secondary to underlying COPD (quit smoking 04/2020), heart disease, OSAH, and obesity   Stroke [facility])    Syncope    a. 01/2016 - felt to be vasovagal.   Thrombocytopenia      Family History  Problem Relation Age of Onset   Diabetes Mother    Diabetes Mellitus II Mother    Hypothyroidism Mother    Hypertension Mother    Kidney failure Mother        Dialysis   Heart attack Mother        4 yo approximately   Hypertension Father    Gout Father    Cancer Maternal Grandfather    Diabetes Maternal Grandfather    Cancer Paternal Aunt      Past Surgical History:  Procedure Laterality Date   CARDIOVERSION N/A 11/06/2023   Procedure: CARDIOVERSION;  Surgeon: Zenaida Morene PARAS, MD;  Location: [facility] ORS;  Service: Cardiovascular;  Laterality: N/A;   cardioverted 11/06/23     COLONOSCOPY WITH PROPOFOL  N/A 08/01/2022   Procedure: COLONOSCOPY WITH PROPOFOL ;  Surgeon: Unk Corinn Skiff, MD;  Location: [facility] ENDOSCOPY;  Service: Gastroenterology;  Laterality: N/A;   ESOPHAGOGASTRODUODENOSCOPY (EGD) WITH PROPOFOL  N/A 08/01/2022   Procedure: ESOPHAGOGASTRODUODENOSCOPY (EGD) WITH PROPOFOL ;  Surgeon: Unk Corinn Skiff, MD;  Location: [facility] ENDOSCOPY;  Service: Gastroenterology;  Laterality: N/A;   ESOPHAGOGASTRODUODENOSCOPY (EGD) WITH PROPOFOL  N/A 10/16/2022   Procedure: ESOPHAGOGASTRODUODENOSCOPY (EGD) WITH PROPOFOL ;  Surgeon: Unk Corinn Skiff, MD;  Location: [facility] ENDOSCOPY;  Service: Gastroenterology;  Laterality: N/A;   FINGER AMPUTATION Left    Traumatic   LEFT HEART CATH AND CORONARY ANGIOGRAPHY N/A 01/06/2017   Procedure: Left Heart Cath and Coronary Angiography;  Surgeon: Darron Deatrice LABOR, MD;  Location: [facility] INVASIVE CV LAB;  Service: Cardiovascular;  Laterality: N/A;   LESION EXCISION N/A 02/16/2024   Procedure: EXCISION, LESION, SCALP;  Surgeon:  Rodolph Romano, MD;  Location: [facility] ORS;  Service: General;  Laterality: N/A;   RIGHT HEART CATH N/A 06/23/2024   Procedure: RIGHT HEART CATH;  Surgeon: Cherrie Toribio SAUNDERS, MD;  Location: [facility] INVASIVE CV LAB;  Service: Cardiovascular;  Laterality: N/A;   RIGHT HEART CATH N/A 07/13/2024   Procedure: RIGHT HEART CATH;  Surgeon: Mady Bruckner, MD;  Location: [facility] INVASIVE CV LAB;  Service: Cardiovascular;  Laterality: N/A;   RIGHT/LEFT HEART CATH AND CORONARY ANGIOGRAPHY N/A 06/13/2020   Procedure: RIGHT/LEFT HEART CATH AND CORONARY ANGIOGRAPHY;  Surgeon: Mady Bruckner, MD;  Location: [facility] INVASIVE CV LAB;  Service: Cardiovascular;  Laterality: N/A;   RIGHT/LEFT HEART CATH AND CORONARY ANGIOGRAPHY Bilateral 08/19/2023   Procedure: RIGHT/LEFT HEART CATH AND CORONARY ANGIOGRAPHY;  Surgeon: Mady Bruckner, MD;  Location: [facility] INVASIVE CV LAB;  Service: Cardiovascular;  Laterality: Bilateral;   SHOULDER ARTHROSCOPY WITH SUBACROMIAL DECOMPRESSION, ROTATOR CUFF REPAIR AND BICEP TENDON REPAIR Left 09/16/2023   Procedure: SHOULDER ARTHROSCOPY WITH DEBRIDEMENT, DECOMPRESSION, ROTATOR CUFF REPAIR AND BICEPS TENODESIS.;  Surgeon: [name] Norleen PARAS, MD;  Location: [facility] ORS;  Service: Orthopedics;  Laterality: Left;   SUBQ ICD IMPLANT N/A 05/20/2022   Procedure: SUBQ ICD IMPLANT;  Surgeon: Cindie Ole DASEN, MD;  Location: [facility] INVASIVE CV LAB;  Service: Cardiovascular;  Laterality: N/A;    Social History   Socioeconomic History   Marital status: Widowed    Spouse name: Not on file   Number of children: 1   Years of education: 64   Highest education level: 12th grade  Occupational History   Occupation: disabled  Tobacco Use   Smoking  status: Former    Current packs/day: 0.00    Average packs/day: 0.5 packs/day for 33.0 years (16.5 ttl pk-yrs)    Types: Cigarettes    Start date: 05/09/1987    Quit date: 05/08/2020    Years since quitting: 4.3   Smokeless tobacco: Former    Quit date:  05/08/2020   Tobacco comments:    Quit Sept 2021  Vaping Use   Vaping status: Former  Substance and Sexual Activity   Alcohol use: Not Currently   Drug use: No   Sexual activity: Not Currently  Other Topics Concern   Not on file  Social History Narrative   Lives at home with wife and his dad's wife.        Works sales/advanced research scientist (life sciences); smoker; cutting; hx of alcoholism [started 13 year]; quit at age of 22 years.    Social Drivers of Health   Tobacco Use: Medium Risk (08/26/2024)   Patient History    Smoking Tobacco Use: Former    Smokeless Tobacco Use: Former    Passive Exposure: Not on Actuary Strain: High Risk (08/16/2024)   [facility]   Overall Financial Resource Strain (CARDIA)    Difficulty of Paying Living Expenses: Hard  Food Insecurity: Food Insecurity Present (08/16/2024)   Received from [facility]   Epic    Within the past 12 months, you worried that your food would run out before you got the money to buy more.: Sometimes true    Within the past 12 months, the food you bought just didn't last and you didn't have money to get more.: Sometimes true  Transportation Needs: No Transportation Needs (08/16/2024)   Received from [facility] - Transportation    In the past 12 months, has lack of transportation kept you from medical appointments or from getting medications?: No    Lack of Transportation (Non-Medical): No  Physical Activity: Not on file  Stress: Not on file  Social Connections: Unknown (10/22/2023)   Social Connection and Isolation Panel    Frequency of Communication with Friends and Family: Not on file    Frequency of Social Gatherings with Friends and Family: Patient declined    Attends Religious Services: Patient declined    Active Member of Clubs or Organizations: Patient declined    Attends Banker Meetings: Patient declined    Marital Status:  Patient declined  Intimate Partner Violence: Not At Risk (07/07/2024)   Epic    Fear of Current or Ex-Partner: No    Emotionally Abused: No    Physically Abused: No    Sexually Abused: No  Depression (PHQ2-9): Medium Risk (07/29/2024)   Depression (PHQ2-9)    PHQ-2 Score: 6  Alcohol Screen: Low Risk (09/17/2022)   Alcohol Screen    Last Alcohol Screening Score (AUDIT): 0  Housing: Low Risk  (08/16/2024)   [facility] from [facility]   Epic    In the last 12 months, was there a time when you were not able to pay the mortgage or rent on time?: No    In the past 12 months, how many times have you moved where you were living?: 0    At any time in the past 12 months, were you homeless or living in a shelter (including now)?: No  Utilities: Not At Risk (08/16/2024)   Received from [facility]   Epic    In the  past 12 months has the electric, gas, oil, or water  company threatened to shut off services in your home?: No  Health Literacy: Adequate Health Literacy (04/02/2024)   B1300 Health Literacy    Frequency of need for help with medical instructions: Never     Allergies[2]   CBC    Component Value Date/Time   WBC 7.0 07/28/2024 0942   RBC 5.89 (H) 07/28/2024 0942   HGB 16.3 07/28/2024 0942   HGB 18.3 (H) 01/01/2024 0937   HCT 48.9 07/28/2024 0942   HCT 58.4 (H) 01/01/2024 0937   PLT 165 07/28/2024 0942   PLT 142 (L) 01/01/2024 0937   MCV 83.0 07/28/2024 0942   MCV 84 01/01/2024 0937   MCV 85 10/07/2013 1703   MCH 27.7 07/28/2024 0942   MCHC 33.3 07/28/2024 0942   RDW 14.5 07/28/2024 0942   RDW 19.5 (H) 01/01/2024 0937   RDW 14.6 (H) 10/07/2013 1703   LYMPHSABS 1.8 07/28/2024 0942   LYMPHSABS 2.2 01/01/2024 0937   LYMPHSABS 2.3 08/26/2012 1200   MONOABS 0.5 07/28/2024 0942   MONOABS 0.9 08/26/2012 1200   EOSABS 0.2 07/28/2024 0942   EOSABS 0.2 01/01/2024 0937   EOSABS 0.1 08/26/2012 1200   BASOSABS 0.1 07/28/2024 0942   BASOSABS 0.1  01/01/2024 0937   BASOSABS 0.1 08/26/2012 1200    Pulmonary Functions Testing Results:    Latest Ref Rng & Units 10/17/2022    8:14 AM  PFT Results  FVC-Pre L 4.38   FVC-Predicted Pre % 85   FVC-Post L 4.50   FVC-Predicted Post % 87   Pre FEV1/FVC % % 83   Post FEV1/FCV % % 85   FEV1-Pre L 3.61   FEV1-Predicted Pre % 91   FEV1-Post L 3.83   DLCO uncorrected ml/min/mmHg 20.99   DLCO UNC% % 70   DLVA Predicted % 69   TLC L 6.58   TLC % Predicted % 91   RV % Predicted % 87     Outpatient Medications Prior to Visit  Medication Sig Dispense Refill   Accu-Chek Softclix Lancets lancets USE UP TO FOUR TIMES DAILY     allopurinol  (ZYLOPRIM ) 300 MG tablet Take 300 mg by mouth every morning.     amiodarone  (PACERONE ) 200 MG tablet Take 1 tablet (200 mg total) by mouth daily. 90 tablet 3   BD PEN NEEDLE NANO 2ND GEN 32G X 4 MM MISC USE  AS DIRECTED AT BEDTIME 200 each 0   blood glucose meter kit and supplies KIT Dispense based on patient and insurance preference. Use up to four times daily as directed. 1 each 11   Bumetanide  (ENBUMYST ) 0.5 MG/0.1ML SOLN Place 1 mg into the nose daily as needed (for weight gain with shortness of breath and swelling). 24 each 1   carvedilol  (COREG ) 3.125 MG tablet Take 1 tablet (3.125 mg total) by mouth 2 (two) times daily with a meal. 60 tablet 3   Continuous Glucose Sensor (FREESTYLE LIBRE 3 PLUS SENSOR) MISC Place 1 sensor on the skin every 15 days. Use to check glucose continuously 2 each 12   dapagliflozin  propanediol (FARXIGA ) 10 MG TABS tablet Take 1 tablet (10 mg total) by mouth daily. 90 tablet 3   Digoxin  62.5 MCG TABS Take 0.0625 mg by mouth daily. 30 tablet 5   ezetimibe  (ZETIA ) 10 MG tablet Take 1 tablet (10 mg total) by mouth daily. 90 tablet 3   hydrALAZINE  (APRESOLINE ) 25 MG tablet Take 1 tablet (25 mg total)  by mouth 2 (two) times daily.     insulin  glargine (LANTUS  SOLOSTAR) 100 UNIT/ML Solostar Pen Inject 55 Units into the skin daily.  Decrease by 2 units every 2 days if fasting blood glucose is less than 90. 15 mL 11   insulin  lispro (HUMALOG ) 100 UNIT/ML KwikPen Inject 20 Units into the skin 3 (three) times daily. + CORRECTION BEFORE BREAKFAST/LUNCH AND SUPPER (CORRECTION = 1 UNIT PER 50 MG/DL ABOVE 849); MAX DAILY DOSE 77 UNITS 15 mL 11   Insulin  Pen Needle 32G X 4 MM MISC USE WITH INSULIN  IN THE MORNING, AT NOON, IN THE EVENING AND AT BEDTIME 400 each 11   ipratropium (ATROVENT ) 0.06 % nasal spray Place 2 sprays into both nostrils every 8 (eight) hours.     ipratropium-albuterol  (DUONEB) 0.5-2.5 (3) MG/3ML SOLN Take 3 mLs by nebulization every 6 (six) hours as needed. 360 mL 11   meclizine  (ANTIVERT ) 25 MG tablet Take 0.5 tablets (12.5 mg total) by mouth 3 (three) times daily as needed for dizziness.     nitroGLYCERIN  (NITROSTAT ) 0.4 MG SL tablet Place 1 tablet (0.4 mg total) under the tongue every 5 (five) minutes x 3 doses as needed for chest pain. 30 tablet 3   omeprazole  (PRILOSEC) 40 MG capsule Take 1 capsule (40 mg total) by mouth daily. 90 capsule 2   potassium chloride  SA (KLOR-CON  M) 20 MEQ tablet Take 1 tablet (20 mEq total) by mouth daily. AM 90 tablet 3   rivaroxaban  (XARELTO ) 20 MG TABS tablet Take 1 tablet (20 mg total) by mouth daily with supper. 90 tablet 3   rosuvastatin  (CRESTOR ) 20 MG tablet Take 1 tablet (20 mg total) by mouth daily. 90 tablet 3   sacubitril -valsartan  (ENTRESTO ) 24-26 MG Take 1 tablet by mouth 2 (two) times daily.     tirzepatide  (MOUNJARO ) 15 MG/0.5ML Pen Inject 15 mg into the skin once a week. 6 mL 3   Torsemide  60 MG TABS Take 60 mg by mouth daily. 90 tablet 0   Vericiguat (VERQUVO) 2.5 MG TABS Take 1 tablet (2.5 mg total) by mouth daily. 30 tablet 5   albuterol  (VENTOLIN  HFA) 108 (90 Base) MCG/ACT inhaler Inhale 2 puffs into the lungs every 4 (four) hours as needed for wheezing or shortness of breath. 8 g 6   No facility-administered medications prior to visit.      [1]  Current  Outpatient Medications:    Accu-Chek Softclix Lancets lancets, USE UP TO FOUR TIMES DAILY, Disp: , Rfl:    allopurinol  (ZYLOPRIM ) 300 MG tablet, Take 300 mg by mouth every morning., Disp: , Rfl:    amiodarone  (PACERONE ) 200 MG tablet, Take 1 tablet (200 mg total) by mouth daily., Disp: 90 tablet, Rfl: 3   BD PEN NEEDLE NANO 2ND GEN 32G X 4 MM MISC, USE  AS DIRECTED AT BEDTIME, Disp: 200 each, Rfl: 0   blood glucose meter kit and supplies KIT, Dispense based on patient and insurance preference. Use up to four times daily as directed., Disp: 1 each, Rfl: 11   Bumetanide  (ENBUMYST ) 0.5 MG/0.1ML SOLN, Place 1 mg into the nose daily as needed (for weight gain with shortness of breath and swelling)., Disp: 24 each, Rfl: 1   carvedilol  (COREG ) 3.125 MG tablet, Take 1 tablet (3.125 mg total) by mouth 2 (two) times daily with a meal., Disp: 60 tablet, Rfl: 3   Continuous Glucose Sensor (FREESTYLE LIBRE 3 PLUS SENSOR) MISC, Place 1 sensor on the skin every 15 days.  Use to check glucose continuously, Disp: 2 each, Rfl: 12   dapagliflozin  propanediol (FARXIGA ) 10 MG TABS tablet, Take 1 tablet (10 mg total) by mouth daily., Disp: 90 tablet, Rfl: 3   Digoxin  62.5 MCG TABS, Take 0.0625 mg by mouth daily., Disp: 30 tablet, Rfl: 5   ezetimibe  (ZETIA ) 10 MG tablet, Take 1 tablet (10 mg total) by mouth daily., Disp: 90 tablet, Rfl: 3   hydrALAZINE  (APRESOLINE ) 25 MG tablet, Take 1 tablet (25 mg total) by mouth 2 (two) times daily., Disp: , Rfl:    insulin  glargine (LANTUS  SOLOSTAR) 100 UNIT/ML Solostar Pen, Inject 55 Units into the skin daily. Decrease by 2 units every 2 days if fasting blood glucose is less than 90., Disp: 15 mL, Rfl: 11   insulin  lispro (HUMALOG ) 100 UNIT/ML KwikPen, Inject 20 Units into the skin 3 (three) times daily. + CORRECTION BEFORE BREAKFAST/LUNCH AND SUPPER (CORRECTION = 1 UNIT PER 50 MG/DL ABOVE 849); MAX DAILY DOSE 77 UNITS, Disp: 15 mL, Rfl: 11   Insulin  Pen Needle 32G X 4 MM MISC, USE WITH  INSULIN  IN THE MORNING, AT NOON, IN THE EVENING AND AT BEDTIME, Disp: 400 each, Rfl: 11   ipratropium (ATROVENT ) 0.06 % nasal spray, Place 2 sprays into both nostrils every 8 (eight) hours., Disp: , Rfl:    ipratropium-albuterol  (DUONEB) 0.5-2.5 (3) MG/3ML SOLN, Take 3 mLs by nebulization every 6 (six) hours as needed., Disp: 360 mL, Rfl: 11   meclizine  (ANTIVERT ) 25 MG tablet, Take 0.5 tablets (12.5 mg total) by mouth 3 (three) times daily as needed for dizziness., Disp: , Rfl:    nitroGLYCERIN  (NITROSTAT ) 0.4 MG SL tablet, Place 1 tablet (0.4 mg total) under the tongue every 5 (five) minutes x 3 doses as needed for chest pain., Disp: 30 tablet, Rfl: 3   omeprazole  (PRILOSEC) 40 MG capsule, Take 1 capsule (40 mg total) by mouth daily., Disp: 90 capsule, Rfl: 2   potassium chloride  SA (KLOR-CON  M) 20 MEQ tablet, Take 1 tablet (20 mEq total) by mouth daily. AM, Disp: 90 tablet, Rfl: 3   rivaroxaban  (XARELTO ) 20 MG TABS tablet, Take 1 tablet (20 mg total) by mouth daily with supper., Disp: 90 tablet, Rfl: 3   rosuvastatin  (CRESTOR ) 20 MG tablet, Take 1 tablet (20 mg total) by mouth daily., Disp: 90 tablet, Rfl: 3   sacubitril -valsartan  (ENTRESTO ) 24-26 MG, Take 1 tablet by mouth 2 (two) times daily., Disp: , Rfl:    tirzepatide  (MOUNJARO ) 15 MG/0.5ML Pen, Inject 15 mg into the skin once a week., Disp: 6 mL, Rfl: 3   Torsemide  60 MG TABS, Take 60 mg by mouth daily., Disp: 90 tablet, Rfl: 0   Vericiguat (VERQUVO) 2.5 MG TABS, Take 1 tablet (2.5 mg total) by mouth daily., Disp: 30 tablet, Rfl: 5   albuterol  (VENTOLIN  HFA) 108 (90 Base) MCG/ACT inhaler, Inhale 2 puffs into the lungs every 4 (four) hours as needed for wheezing or shortness of breath., Disp: 8 g, Rfl: 6 [2]  Allergies Allergen Reactions   Metformin  And Related Nausea And Vomiting   Ranitidine  Other (See Comments)   Zantac  [Ranitidine  Hcl] Diarrhea and Nausea Only    Night sweats   "

## 2024-08-26 NOTE — Progress Notes (Signed)
 "  Advanced Heart Failure Clinic Note    PCP: Ostwalt, Janna, PA  Primary Cardiologist: Perla Lye, MD  HF cardiologist: Cherrie Sieving, MD   Chief Complaint: low BP   HPI:  Gabriel [name] is a 54 y/o male with a history of atrial fibrillation (03/25), HTN, T2DM, chronic chest pain, chronic troponin elevation, COPD, CKD, asthma, NSVT, OSA, unprovoked PE, pulmonary HTN, psoriasis, migraines, cirrhosis from NASH, ICD placement (10/23), previous tobacco use and chronic heart failure. Had left shoulder arthroscopy with debridement, decompression, rotator cuff repair and biceps tenodesis done 09/16/23.   RHC/LHC done 06/13/20 and showed: Severe single-vessel coronary artery disease with chronic total occlusion of the proximal RCA.  The distal vessel fills via left to right collaterals. Mild to moderate, nonobstructive CAD involving the LAD and LCx with up to 50% stenosis. Mildly elevated left and right heart filling pressures. Moderate pulmonary hypertension (mean PAP 40 mmHg) with significantly elevated pulmonary vascular resistance (PVR 7.4 WU). Severely reduced Fick cardiac output/index.  Echo 05/21/21: EF of 40-45% along with mild LVH and mild Gabriel.   Admitted 09/2021 & 11/2021 with HF.   Echo 11/24/21: EF of 20-25% along with mild LVH and mild Gabriel.  Echo 02/06/22: EF of 30-35% along with mild/moderate AR.   Admitted 05/20/22 for ICD placement and discharged the next day. Was in the ED 12/21/22 due to dizziness & chest pain.   Echo 08/14/22: EF 40-45% with mild LVH, Grade I DD, severe hypokinesis of left ventricular, entire inferior and inferolateral wall.  Echo 03/08/23: EF 45-50% with moderate LVH, trivial Gabriel, mild dilatation of aortic root at 43 mm  Was in the ED 06/09/23 due to SOB/ chest pain for previous 2 days. CXR concerning for pneumonia so antibiotics provided. Initial troponin in the 50's which is close to his baseline. EKG without ischemic changes.   RHC/ LHC 08/19/23:   Relatively stable appearance of the coronary arteries since last catheterization in 2021.  Chronically occluded proximal RCA now has antegrade flow, though I suspect this represents bridging collaterals.  Left-to-right collateral remain as well as moderate LAD and LCx disease. Mildly elevated left heart filling pressures (LVEDP 20 mmHg, PCWP 18 mmHg). Moderate pulmonary hypertension (mean PA 41 mmHg, PVR 5.3 WU). Moderately reduced Fick cardiac output/index (CO 4.3 L/min, CI 1.9 L/min/m^2).  Admitted 10/22/23 due to COPD exacerbation secondary to influenza.   Admitted 11/03/23 with dizziness, tachycardia and new onset AF. Echo 3/18 -- EF 35-40%, global LV hypokinesis, severely dilated LV internal cavity size, mild LVH, small pericardial effusion, mild Gabriel. IV diuresed, GDMT restarted. Successful cardioversion 11/06/23. Amiodarone  loaded.   Was seen in the HF clinic 12/05/23 where torsemide  was increased to 40mg  daily X 3 days and then decreased back to 20mg  daily.   Was seen in the HF clinic 12/10/23 & was sent for IV lasix  due to being fluid up. Zio monitor placed due to increased palpitations.   Echo 01/02/24: EF 35-40%, mild LVH, low normal RV, mild LAE, mild/ moderate AR, mild dilatation of the aortic root, measuring 40 mm   Admitted 02/06/24 with SOB and hypoxia for several days prior to admission. CXR showed edema, no effusions. ECG NSR. BNP 600. IV diuresed with transition to oral diuretics.   Seen in [facility] 06/17/24 and due to being fluid up, given 3 doses of metolazone  2.5mg  given and RHC was scheduled.   RHC 06/23/24:  RA = 3 RV = 36/5 PA = 34/15 (24) PCW = 22 Fick cardiac output/index =  4.3/1.8 TD CO/CI = 4.8/2.0 PVR = 0.4 WU Ao sat = 94%  PA sat = 59%, 64% PAPi = 6.3  Assessment: 1. Mildly elevated filling pressures with moderate to severely reduced CO  Admitted 07/06/24 with worsening shortness of breath and abdominal pain. Symptoms started 4-5 days prior to admission, patient  started to notice intermittent stretching like abdominal pain, worsening with torso movement. He also noticed increasing girth of his abdomen and he gained more than 10 pounds in last 7 days. He tried to increase his torsemide  dosage to twice daily with no significant improvement. Yesterday he started develop wheezing and dry cough and increasing shortness of breath and could not lie flat because of shortness of breath. CT chest abdomen showed no acute findings. Cardiology consulted with adjustment in diuretics. Lactic acid elevated and we started milrinone  drip and PICC line placed. Echo 07/13/24: EF 35-40%, G1DD, normal RV. RHC 07/13/24: Mildly elevated left and right heart filling pressures (PCWP 14 mmHg, mean RA 7 mmHg). Mild-moderate pulmonary hypertension (PA 48/24, mean 32 mmHg). Mildly-moderately reduced cardiac output on milrinone  0.125 mcg/kg's per minute (Fick CO/CI 4.9 L/min, 2.1 L/min/m; thermodilution CO/CI 4.7 L/min, 2.0 L/min/m). To consider weekly IV lasix  in outpatient setting.   He presents today for an acute HF follow-up visit with a chief complaint of low BP after completing pulmonary rehab session earlier today. Says that he was finishing up when he started to feel dizzy. He walked up to the HF clinic to be seen. He has SOB, fatigue, intermittent chest pain, palpitations that isn't any different from his usual. Does note some dizziness when standing today. Feels like he's been eating / drinking well and says that, at home, his SBP has been in the 120's. Did take all his AM medications today. Denies pedal edema or change in sleeping pattern.   Has received nasal bumex  but hasn't used it as he's not entirely sure how to use it.   ROS: All systems negative except as listed in HPI, PMH and Problem List.  SH:  Social History   Socioeconomic History   Marital status: Widowed    Spouse name: Not on file   Number of children: 1   Years of education: 80   Highest education level:  12th grade  Occupational History   Occupation: disabled  Tobacco Use   Smoking status: Former    Current packs/day: 0.00    Average packs/day: 0.5 packs/day for 33.0 years (16.5 ttl pk-yrs)    Types: Cigarettes    Start date: 05/09/1987    Quit date: 05/08/2020    Years since quitting: 4.3   Smokeless tobacco: Former    Quit date: 05/08/2020   Tobacco comments:    Quit Sept 2021  Vaping Use   Vaping status: Former  Substance and Sexual Activity   Alcohol use: Not Currently   Drug use: No   Sexual activity: Not Currently  Other Topics Concern   Not on file  Social History Narrative   Lives at home with wife and his dad's wife.        Works sales/advanced research scientist (life sciences); smoker; cutting; hx of alcoholism [started 13 year]; quit at age of 22 years.    Social Drivers of Health   Tobacco Use: Medium Risk (08/24/2024)   Received from [facility]   Patient History    Smoking Tobacco Use: Former    Smokeless Tobacco Use: Former    Passive Exposure: Not on Actuary Strain: [employer]  Risk (08/16/2024)   Received from [facility]   Overall Financial Resource Strain (CARDIA)    Difficulty of Paying Living Expenses: Hard  Food Insecurity: Food Insecurity Present (08/16/2024)   Received from [facility]   Epic    Within the past 12 months, you worried that your food would run out before you got the money to buy more.: Sometimes true    Within the past 12 months, the food you bought just didn't last and you didn't have money to get more.: Sometimes true  Transportation Needs: No Transportation Needs (08/16/2024)   Received from [facility] - Transportation    In the past 12 months, has lack of transportation kept you from medical appointments or from getting medications?: No    Lack of Transportation (Non-Medical): No  Physical Activity: Not on file  Stress: Not on file  Social Connections: Unknown  (10/22/2023)   Social Connection and Isolation Panel    Frequency of Communication with Friends and Family: Not on file    Frequency of Social Gatherings with Friends and Family: Patient declined    Attends Religious Services: Patient declined    Active Member of Clubs or Organizations: Patient declined    Attends Banker Meetings: Patient declined    Marital Status: Patient declined  Intimate Partner Violence: Not At Risk (07/07/2024)   Epic    Fear of Current or Ex-Partner: No    Emotionally Abused: No    Physically Abused: No    Sexually Abused: No  Depression (PHQ2-9): Medium Risk (07/29/2024)   Depression (PHQ2-9)    PHQ-2 Score: 6  Alcohol Screen: Low Risk (09/17/2022)   Alcohol Screen    Last Alcohol Screening Score (AUDIT): 0  Housing: Low Risk  (08/16/2024)   Received from [facility]   Epic    In the last 12 months, was there a time when you were not able to pay the mortgage or rent on time?: No    In the past 12 months, how many times have you moved where you were living?: 0    At any time in the past 12 months, were you homeless or living in a shelter (including now)?: No  Utilities: Not At Risk (08/16/2024)   Received from [facility]   Epic    In the past 12 months has the electric, gas, oil, or water  company threatened to shut off services in your home?: No  Health Literacy: Adequate Health Literacy (04/02/2024)   B1300 Health Literacy    Frequency of need for help with medical instructions: Never    FH:  Family History  Problem Relation Age of Onset   Diabetes Mother    Diabetes Mellitus II Mother    Hypothyroidism Mother    Hypertension Mother    Kidney failure Mother        Dialysis   Heart attack Mother        16 yo approximately   Hypertension Father    Gout Father    Cancer Maternal Grandfather    Diabetes Maternal Grandfather    Cancer Paternal Aunt     Past Medical History:  Diagnosis Date    Anginal pain    Aortic atherosclerosis    Aortic root dilation 11/28/2016   a.) TTE 11/28/2016: 43 mm; b.) TTE 11/23/2019: 38 mm; c.) TTE 08/14/2022: 40 mm; d.) TTE 03/08/2023: 43 mm; e.) TTE 01/02/2024: 40 mm  Arteriosclerosis of right carotid artery    a.) carotid doppler 04/29/2017: <50% RICA   Atrial fibrillation with RVR (HCC)    a.) CHA2DS2VASc = 6 (HFrEF, HTN, CVA x 2, vascular disease, T2DM) as of 01/27/2024; b.) s/p DCCV 11/06/2023 --> 200 J x 1 -->  NSR; c.) rate/rhythm maintained on oral amiodarone  + carvedilol ; chronically anticoagulated with rivaroxaban    CAD (coronary artery disease)    a.) MV 04/2015: low risk; b.) LHC 01/06/2017: minor irregs in LAD/Diag/LCX/OM, RCA 40p/m/d; c.) R/LHC 06/13/2020: 50% dLAD, 30% p-mLCx, 40% OM1, 100% pRCA (L-R collats) - med mgmt; d.) [facility] 08/19/2023: 50% dLAD, 30% p-mLCx, 40% OM1, 100% pRCA (L-R collats), 50% p-mRCA - med mgmt   Cardiomegaly    Cerebellar infarct (HCC)    a.) brain MRI 06/23/2023: tiny chronic infarct within the RIGHT cerebellar hemisphere; unsure of timing of neurological event; no deficits   Chest wall pain, chronic    Chronic 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    Chronic Troponin Elevation    CKD (chronic kidney disease), stage II-III    COPD (chronic obstructive pulmonary disease) (HCC)    CPAP (continuous positive airway pressure) dependence 03/08/2023   Depression    DM (diabetes mellitus), type 2 (HCC)    Dyspnea    GERD (gastroesophageal reflux disease)    Headache    HFrEF (heart failure with reduced ejection fraction) (HCC)    History of 2019 novel coronavirus disease (COVID-19) 03/01/2022   History of kidney stones    History of methicillin resistant staphylococcus aureus (MRSA)    Hyperlipidemia    Hypertension    ICD (implantable cardioverter-defibrillator) in place 05/20/2022   a.) LEFT midaxillary [employer] Emblem MRI S-ICD (SN: [id number])   Left rotator cuff tear    Liver cirrhosis secondary to NASH  (HCC)    Long term current use of amiodarone     Long term current use of immunosuppressive drug    a.) ixekizumab for psoriasis/psoriatic arthritis diagnosis   Long-term use of aspirin  therapy    Mediastinal lymphadenopathy    Mitral regurgitation    Mild to moderate by October 2021 echocardiogram.   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 12/2015 noted on tele-->amio;  b. 12/2015 Event monitor: no VT noted.   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  12/31/19: RUL and RLL PEs.   Secondary polycythemia    a.) negative for JAK2 with reflex and BCR-ABL mutations; seeing hematology with etiology felt to be secondary to underlying COPD (quit  smoking 04/2020), heart disease, OSAH, and obesity   Stroke [facility])    Syncope    a. 01/2016 - felt to be vasovagal.   Thrombocytopenia     Current Outpatient Medications  Medication Sig Dispense Refill   Accu-Chek Softclix Lancets lancets USE UP TO FOUR TIMES DAILY     albuterol  (VENTOLIN  HFA) 108 (90 Base) MCG/ACT inhaler Inhale 2 puffs into the lungs every 4 (four) hours as needed for  wheezing or shortness of breath. 8 g 6   allopurinol  (ZYLOPRIM ) 300 MG tablet Take 300 mg by mouth every morning.     amiodarone  (PACERONE ) 200 MG tablet Take 1 tablet (200 mg total) by mouth daily. 90 tablet 3   BD PEN NEEDLE NANO 2ND GEN 32G X 4 MM MISC USE  AS DIRECTED AT BEDTIME 200 each 0   blood glucose meter kit and supplies KIT Dispense based on patient and insurance preference. Use up to four times daily as directed. 1 each 11   Bumetanide  (ENBUMYST ) 0.5 MG/0.1ML SOLN Place 1 mg into the nose daily as needed (for weight gain with shortness of breath and swelling). 24 each 1   carvedilol  (COREG ) 3.125 MG tablet Take 1 tablet (3.125 mg total) by mouth 2 (two) times daily with a meal. 60 tablet 3   Continuous Glucose Sensor (FREESTYLE LIBRE 3 PLUS SENSOR) MISC Place 1 sensor on the skin every 15 days. Use to check glucose continuously 2 each 12   dapagliflozin  propanediol (FARXIGA ) 10 MG TABS tablet Take 1 tablet (10 mg total) by mouth daily. 90 tablet 3   Digoxin  62.5 MCG TABS Take 0.0625 mg by mouth daily. 30 tablet 5   ezetimibe  (ZETIA ) 10 MG tablet Take 1 tablet (10 mg total) by mouth daily. 90 tablet 3   hydrALAZINE  (APRESOLINE ) 25 MG tablet Take 1 tablet (25 mg total) by mouth 2 (two) times daily.     insulin  glargine (LANTUS  SOLOSTAR) 100 UNIT/ML Solostar Pen Inject 55 Units into the skin daily. Decrease by 2 units every 2 days if fasting blood glucose is less than 90. 15 mL 11   insulin  lispro (HUMALOG ) 100 UNIT/ML KwikPen Inject 20 Units into the skin 3 (three) times daily. + CORRECTION BEFORE BREAKFAST/LUNCH AND SUPPER (CORRECTION = 1 UNIT PER 50 MG/DL ABOVE 849); MAX DAILY DOSE 77 UNITS 15 mL 11   Insulin  Pen Needle 32G X 4 MM MISC USE WITH INSULIN  IN THE MORNING, AT NOON, IN THE EVENING AND AT BEDTIME 400 each 11   ipratropium (ATROVENT ) 0.06 % nasal spray Place 2 sprays into both nostrils every 8 (eight) hours.     ipratropium-albuterol  (DUONEB) 0.5-2.5 (3) MG/3ML SOLN Take 3 mLs  by nebulization every 6 (six) hours as needed. 360 mL 11   meclizine  (ANTIVERT ) 25 MG tablet Take 0.5 tablets (12.5 mg total) by mouth 3 (three) times daily as needed for dizziness.     nitroGLYCERIN  (NITROSTAT ) 0.4 MG SL tablet Place 1 tablet (0.4 mg total) under the tongue every 5 (five) minutes x 3 doses as needed for chest pain. 30 tablet 3   omeprazole  (PRILOSEC) 40 MG capsule Take 1 capsule (40 mg total) by mouth daily. 90 capsule 2   potassium chloride  SA (KLOR-CON  M) 20 MEQ tablet Take 1 tablet (20 mEq total) by mouth daily. AM 90 tablet 3   rivaroxaban  (XARELTO ) 20 MG TABS tablet Take 1 tablet (20 mg total) by mouth daily with supper. 90 tablet 3   rosuvastatin  (CRESTOR ) 20 MG  tablet Take 1 tablet (20 mg total) by mouth daily. 90 tablet 3   sacubitril -valsartan  (ENTRESTO ) 24-26 MG Take 1 tablet by mouth 2 (two) times daily.     tirzepatide  (MOUNJARO ) 15 MG/0.5ML Pen Inject 15 mg into the skin once a week. 6 mL 3   torsemide  (DEMADEX ) 20 MG tablet Take 60 mg by mouth daily.     Torsemide  60 MG TABS Take 60 mg by mouth daily. 90 tablet 0   Vericiguat (VERQUVO) 2.5 MG TABS Take 1 tablet (2.5 mg total) by mouth daily. 30 tablet 5   No current facility-administered medications for this visit.   Initial BP was 88/68  Orthostatic Blood Pressure after drinking 16 ounces of water :  sitting 94/94, standing 100/72, standing 3 minutes later 122/70  Pulse:   sitting 91, standing 91, standing 3 minutes later 90   PHYSICAL EXAM:  General: Well appearing.  Cor: No JVD. Regular rhythm, rate.  Lungs: clear Abdomen: soft, nontender, nondistended. Extremities: no edema Neuro:. Affect pleasant  ECG: not done   ASSESSMENT & PLAN:  1.  Chronic HFrEF/Mixed Ischemic and nonischemic cardiomyopathy:   - Mixed ICM/NICM - NYHA [name] III, euvolemic - has received weekly IV lasix  X 2 but doesn't feel like it's helped a lot. Unable to find Enbumyst  at pharmacies. Will begin vericiguat 2.5mg  daily.  Titrate to 5mg  next visit - s/p [facility]-ICD; denies any shocks - weight up 3 pounds from last visit here 3 weeks ago - Echo 05/21/21: EF of 40-45% along with mild LVH and mild Gabriel.  - Echo 11/24/21: EF of 20-25% along with mild LVH and mild Gabriel.  - Echo 02/06/22: EF of 30-35% along with mild/moderate AR.  - Echo 08/14/22: EF 40-45% with mild LVH, Grade I DD, severe hypokinesis of left ventricular, entire inferior and inferolateral wall.  - Echo 03/08/23: EF 45-50% with moderate LVH, trivial Gabriel, mild dilatation of aortic root at 43 mm - Echo 10/1823: EF 35-40%, global LV hypokinesis, severely dilated LV internal cavity size, mild LVH, small pericardial effusion, mild Gabriel. - Echo 01/02/24: EF 35-40%, mild LVH, low normal RV, mild LAE, mild/ moderate AR, mild dilatation of the aortic root, measuring 40 mm  - RHC 06/23/24: Mildly elevated filling pressures with moderate to severely reduced CO - Echo 07/13/24: EF 35-40%, G1DD, normal RV.  - RHC 07/13/24: Mildly elevated left and right heart filling pressures (PCWP 14 mmHg, mean RA 7 mmHg). Mild-moderate pulmonary hypertension (PA 48/24, mean 32 mmHg). Mildly-moderately reduced cardiac output on milrinone  0.125 mcg/kg's per minute (Fick CO/CI 4.9 L/min, 2.1 L/min/m; thermodilution CO/CI 4.7 L/min, 2.0 L/min/m). To consider weekly IV lasix  in outpatient setting.  - participating in pulmonary rehab twice weekly - continue carvedilol  3.125mg  BID - continue Farxiga  10mg  daily - continue digoxin  0.0625mg  daily. Dig level 07/21/24 was <0.6 - continue torsemide  60mg  QD/ potassium 20meq QD.  - continue entresto  24/26mg  BID. If BP continues to drop, will stop hydralazine . Hold PM hydralazine  today - continue to use Furoscix  if weight >= 250 - he will bring the nasal bumex  to clinic to be shown how to use it - still waiting on insurance approval for cardiomems - saw [facility] provider (Bensimhon) 09/24 - proBNP 07/27/24 was 1328   2.  CAD/Elevated Troponin and  chronic CP:  - H/o CAD  - RHC/ LHC 08/19/23:     1. Relatively stable appearance of the coronary arteries since last catheterization in 2021.  Chronically occluded proximal RCA now has antegrade  flow, though I suspect this represents bridging collaterals.  Left-to-right collateral remain as well as moderate LAD and LCx disease.    2. Mildly elevated left heart filling pressures (LVEDP 20 mmHg, PCWP 18 mmHg).    3. Moderate pulmonary hypertension (mean PA 41 mmHg, PVR 5.3 WU).    4. Moderately reduced Fick cardiac output/index (CO 4.3 L/min, CI 1.9 L/min/m^2). - continue rosuvastatin  20mg  daily - continue zetia  10mg  daily - saw cardiology Florestine) 07/25 - lipid panel 01/01/24: LDL 94, triglycerides 341 - insurance is currently not covering repatha  injection   3.  HTN:  - BP initially was 88/68, Drank 2 bottles of water  and it rose to 122/70 with standing (see orthostatic BPs per above) - will have him hold PM hydralazine  today. Home SBP's have been in the 120's. If he continues to have drops, will stop hydralazine  completely - saw PCP Mazie) 09/25 - BMP 07/27/24 reviewed: sodium 138, potassium 3.8, creatinine 1.27 & GFR >60   4.  H/o PE/Chronic anticoagulation:  - continue xarelto  20mg  daily - no recent bleeding - HG 07/28/24 was 16.3   5.  DM2- - saw nephrology (Korrapati) 08/23  - A1c 04/02/24 was 8.8% - saw endocrinology Art) 01/26   6. OSA- - Sleep study 3/22: AHI 28  - sleep study 12/23; AHI 45.1 - sleep study 11/24 AHI 70.0 - has not worn bipap in ~ 1 year as he says that he feels like he's choking. He says that he tolerated CPAP better.  - saw pulmonology Jodeen) 11/24; returns 01/26  7: AF- - saw EP Marny) 10/25 - cardioverted 11/06/23. Plan for possible ablation in the future as previous ablation was previously cancelled due to clinical instability.  - continue amiodarone  200mg  daily - continue xarelto  20mg  daily   Return in 1 month, sooner if  needed.   I spent 20 minutes reviewing records, interviewing/ examing patient and managing plan/ orders.    Gabriel DELENA Class, FNP-C 08/26/2024 "

## 2024-08-27 ENCOUNTER — Ambulatory Visit: Admitting: Family

## 2024-08-31 ENCOUNTER — Encounter

## 2024-08-31 DIAGNOSIS — I5022 Chronic systolic (congestive) heart failure: Secondary | ICD-10-CM

## 2024-08-31 DIAGNOSIS — I130 Hypertensive heart and chronic kidney disease with heart failure and stage 1 through stage 4 chronic kidney disease, or unspecified chronic kidney disease: Secondary | ICD-10-CM | POA: Diagnosis not present

## 2024-08-31 NOTE — Progress Notes (Signed)
 Daily Session Note  Patient Details  Name: Gabriel [name]. MRN: [id number] Date of Birth: 1970-11-25 Referring Provider:   Flowsheet Row Pulmonary Rehab from 07/28/2024 in [facility]monary [facility]  Referring Provider Cherrie Sieving, MD    Encounter Date: 08/31/2024  Check In:  Session Check In - 08/31/24 0723       Check-In   Supervising physician immediately available to respond to emergencies See telemetry face sheet for immediately available ER MD    Location [facility]-Cardiac & Pulmonary Rehab    Staff Present Burnard Davenport RN,BSN,MPA;Maxon Conetta BS, Exercise Physiologist;Margaret Best, MS, Exercise Physiologist;Jason Elnor RDN,LDN    Virtual Visit No    Medication changes reported     No    Fall or balance concerns reported    No    Warm-up and Cool-down Performed on first and last piece of equipment    Resistance Training Performed Yes    VAD Patient? No    PAD/SET Patient? No      Pain Assessment   Currently in Pain? No/denies             Tobacco Use History[1]  Goals Met:  Proper associated with RPD/PD & O2 Sat Independence with exercise equipment Using PLB without cueing & demonstrates good technique Exercise tolerated well No report of concerns or symptoms today Strength training completed today  Goals Unmet:  Not Applicable  Comments: Pt able to follow exercise prescription today without complaint. Discussed recent weight increase with patient. Patient reports to RD that he is taking diuretic but it has not been effective. He is asymptomatic at this time and planning to contact his MD for more effective diuretic. Will continue to monitor for progression.    Dr. Oneil Pinal is Medical Director for [facility] Cardiac Rehabilitation.  Dr. Fuad Aleskerov is Medical Director for [facility] Pulmonary Rehabilitation.     [1]  Social History Tobacco Use  Smoking Status Former   Current packs/day: 0.00   Average packs/day: 0.5 packs/day for  33.0 years (16.5 ttl pk-yrs)   Types: Cigarettes   Start date: 05/09/1987   Quit date: 05/08/2020   Years since quitting: 4.3  Smokeless Tobacco Former   Quit date: 05/08/2020  Tobacco Comments   Quit Sept 2021

## 2024-09-01 DIAGNOSIS — I5022 Chronic systolic (congestive) heart failure: Secondary | ICD-10-CM

## 2024-09-01 NOTE — Progress Notes (Signed)
 Pulmonary Individual Treatment Plan  Patient Details  Name: Gabriel [name]. MRN: [id number] Date of Birth: 12-04-1970 Referring Provider:   Flowsheet Row Pulmonary Rehab from 07/28/2024 in [facility] Cardiac and Pulmonary Rehab  Referring Provider Cherrie Sieving, MD    Initial Encounter Date:  Flowsheet Row Pulmonary Rehab from 07/28/2024 in [facility] Cardiac and Pulmonary Rehab  Date 07/28/24    Visit Diagnosis: Heart failure, chronic systolic (HCC)  Patient's Home Medications on Admission: Current Medications[1]  Past Medical History: Past Medical History:  Diagnosis Date   Anginal pain    Aortic atherosclerosis    Aortic root dilation 11/28/2016   a.) TTE 11/28/2016: 43 mm; b.) TTE 11/23/2019: 38 mm; c.) TTE 08/14/2022: 40 mm; d.) TTE 03/08/2023: 43 mm; e.) TTE 01/02/2024: 40 mm   Arteriosclerosis of right carotid artery    a.) carotid doppler 04/29/2017: <50% RICA   Atrial fibrillation with RVR (HCC)    a.) CHA2DS2VASc = 6 (HFrEF, HTN, CVA x 2, vascular disease, T2DM) as of 01/27/2024; b.) s/p DCCV 11/06/2023 --> 200 J x 1 -->  NSR; c.) rate/rhythm maintained on oral amiodarone  + carvedilol ; chronically anticoagulated with rivaroxaban    CAD (coronary artery disease)    a.) MV 04/2015: low risk; b.) LHC 01/06/2017: minor irregs in LAD/Diag/LCX/OM, RCA 40p/m/d; c.) R/LHC 06/13/2020: 50% dLAD, 30% p-mLCx, 40% OM1, 100% pRCA (L-R collats) - med mgmt; d.) [facility] 08/19/2023: 50% dLAD, 30% p-mLCx, 40% OM1, 100% pRCA (L-R collats), 50% p-mRCA - med mgmt   Cardiomegaly    Cerebellar infarct (HCC)    a.) brain MRI 06/23/2023: tiny chronic infarct within the RIGHT cerebellar hemisphere; unsure of timing of neurological event; no deficits   Chest wall pain, chronic    Chronic 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    Chronic Troponin Elevation    CKD (chronic kidney disease), stage II-III    COPD (chronic obstructive pulmonary disease) (HCC)    CPAP (continuous positive airway pressure) dependence  03/08/2023   Depression    DM (diabetes mellitus), type 2 (HCC)    Dyspnea    GERD (gastroesophageal reflux disease)    Headache    HFrEF (heart failure with reduced ejection fraction) (HCC)    History of 2019 novel coronavirus disease (COVID-19) 03/01/2022   History of kidney stones    History of methicillin resistant staphylococcus aureus (MRSA)    Hyperlipidemia    Hypertension    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 12/2015 noted on tele-->amio;  b. 12/2015 Event monitor: no VT noted.   Obesity (BMI 30.0-34.9)    On rivaroxaban  therapy    OSA treated with BiPAP    Pneumonia    Polyp of descending colon  Psoriasis    Psoriatic arthritis (HCC)    a.) Tx'd with ixekizumab   Pulmonary HTN (HCC) 02/29/2016   a.) TTE 02/29/2016: RVSP 47; b.) TTE 06/26/2019: RVSP 58.4; c.) TTE 12/08/2019: RVSP 40.8; d.) TTE 06/08/2020: RVSP 44.7; e.) R/LHC 06/08/2020: mRA 9, mPA 40, mPWCP 17, AO sat 98, PA sat 61, CO 3.1, CI 1.3, PVR 7.4, LVEDP 20; f.) R/LHC 08/18/2020: mRA 5, mPA 41, mPCWP 18, AO sat 92, PA sat 59, CO 4.3, CI 1.9, PVR 5.3   Recurrent pulmonary emboli (HCC) 06/07/2020   06/07/20: small  bilateral PEs.  12/31/19: RUL and RLL PEs.   Secondary polycythemia    a.) negative for JAK2 with reflex and BCR-ABL mutations; seeing hematology with etiology felt to be secondary to underlying COPD (quit smoking 04/2020), heart disease, OSAH, and obesity   Stroke [facility])    Syncope    a. 01/2016 - felt to be vasovagal.   Thrombocytopenia     Tobacco Use: Tobacco Use History[2]  Labs: Review Flowsheet  More data exists      Latest Ref Rng & Units 01/01/2024 04/02/2024 06/23/2024 07/12/2024 07/13/2024  Labs for ITP Cardiac and Pulmonary Rehab  Cholestrol 100 - 199 mg/dL 819  820  - - - -  LDL (calc) 0 - 99 mg/dL 94  - - - -  HDL-C >60 mg/dL 29  - - - -  Trlycerides 0 - 149 mg/dL 658  655  - - - -  Hemoglobin A1c 4.0 - 5.6 % 6.9  8.8  - - -  Bicarbonate 20.0 - 28.0 mmol/L - - 29.2  28.0  - 31.6   TCO2 22 - 32 mmol/L - - 31  29  - 33   O2 Saturation % - - 64  59  66.9  65     Details       Multiple values from one day are sorted in reverse-chronological order          Pulmonary Assessment Scores:  Pulmonary Assessment Scores     Row Name 07/28/24 1528         ADL UCSD   ADL Phase Entry     SOB Score total 53     Rest 0     Walk 4     Stairs 4     Bath 5     Dress 2     Shop 3       CAT Score   CAT Score 20       mMRC Score   mMRC Score 4        UCSD: Self-administered rating of dyspnea associated with activities of daily living (ADLs) 6-point scale (0 = not at all to 5 = maximal or unable to do because of breathlessness)  Scoring Scores range from 0 to 120.  Minimally important difference is 5 units  CAT: CAT can identify the health impairment of COPD patients and is better correlated with disease progression.  CAT has a scoring range of zero to 40. The CAT score is classified into four groups of low (less than 10), medium (10 - 20), high (21-30) and very high (31-40) based on the impact level of disease on health status. A CAT score over 10 suggests  significant symptoms.  A worsening CAT score could be explained by an exacerbation, poor medication adherence, poor inhaler technique, or progression of COPD or comorbid conditions.  CAT MCID is 2 points  mMRC: mMRC (Modified Medical Research Council) Dyspnea  Scale is used to assess the degree of baseline functional disability in patients of respiratory disease due to dyspnea. No minimal important difference is established. A decrease in score of 1 point or greater is considered a positive change.   Pulmonary Function Assessment:  Pulmonary Function Assessment - 07/26/24 1417       Breath   Shortness of Breath Yes;Limiting activity          Exercise Target Goals: Exercise Program Goal: Individual exercise prescription set using results from initial 6 min walk test and THRR while considering  patients activity barriers and safety.   Exercise Prescription Goal: Initial exercise prescription builds to 30-45 minutes a day of aerobic activity, 2-3 days per week.  Home exercise guidelines will be given to patient during program as part of exercise prescription that the participant will acknowledge.  Education: Aerobic Exercise: - Group verbal and visual presentation on the components of exercise prescription. Introduces F.I.T.T principle from ACSM for exercise prescriptions.  Reviews F.I.T.T. principles of aerobic exercise including progression. Written material provided at class time. Flowsheet Row Cardiac Rehab from 08/08/2022 in [facility] Cardiac and Pulmonary Rehab  Education need identified 07/01/22    Education: Resistance Exercise: - Group verbal and visual presentation on the components of exercise prescription. Introduces F.I.T.T principle from ACSM for exercise prescriptions  Reviews F.I.T.T. principles of resistance exercise including progression. Written material provided at class time.    Education: Exercise & Equipment Safety: - Individual verbal instruction and demonstration  of equipment use and safety with use of the equipment. Flowsheet Row Pulmonary Rehab from 07/28/2024 in [facility] Cardiac and Pulmonary Rehab  Date 07/28/24  Educator MB  Instruction Review Code 1- Verbalizes Understanding    Education: Exercise Physiology & General Exercise Guidelines: - Group verbal and written instruction with models to review the exercise physiology of the cardiovascular system and associated critical values. Provides general exercise guidelines with specific guidelines to those with heart or lung disease.  Flowsheet Row Pulmonary Rehab from 07/28/2024 in [facility] Cardiac and Pulmonary Rehab  Education need identified 07/28/24    Education: Flexibility, Balance, Mind/Body Relaxation: - Group verbal and visual presentation with interactive activity on the components of exercise prescription. Introduces F.I.T.T principle from ACSM for exercise prescriptions. Reviews F.I.T.T. principles of flexibility and balance exercise training including progression. Also discusses the mind body connection.  Reviews various relaxation techniques to help reduce and manage stress (i.e. Deep breathing, progressive muscle relaxation, and visualization). Balance handout provided to take home. Written material provided at class time.   Activity Barriers & Risk Stratification:  Activity Barriers & Cardiac Risk Stratification - 07/28/24 1517       Activity Barriers & Cardiac Risk Stratification   Activity Barriers Arthritis;Deconditioning;Decreased Ventricular Function;Shortness of Breath          6 Minute Walk:  6 Minute Walk     Row Name 07/28/24 1512         6 Minute Walk   Phase Initial     Distance 900 feet     Walk Time 6 minutes     # of Rest Breaks 0     MPH 1.7     METS 2.8     RPE 17     Perceived Dyspnea  2     VO2 Peak 9.8     Symptoms Yes (comment)     Comments Bilateral Hip Pain 10/10     Resting HR 89 bpm     Resting BP 150/88  Resting Oxygen Saturation  98 %      Exercise Oxygen Saturation  during 6 min walk 92 %     Max Ex. HR 105 bpm     Max Ex. BP 158/80     2 Minute Post BP 144/80       Interval HR   1 Minute HR 97     2 Minute HR 99     3 Minute HR 97     4 Minute HR 105     5 Minute HR 100     6 Minute HR 98     2 Minute Post HR 92     Interval Heart Rate? Yes       Interval Oxygen   Interval Oxygen? Yes     Baseline Oxygen Saturation % 98 %     1 Minute Oxygen Saturation % 94 %     1 Minute Liters of Oxygen 0 L     2 Minute Oxygen Saturation % 93 %     2 Minute Liters of Oxygen 0 L     3 Minute Oxygen Saturation % 92 %     3 Minute Liters of Oxygen 0 L     4 Minute Oxygen Saturation % 94 %     4 Minute Liters of Oxygen 0 L     5 Minute Oxygen Saturation % 96 %     5 Minute Liters of Oxygen 0 L     6 Minute Oxygen Saturation % 95 %     6 Minute Liters of Oxygen 0 L     2 Minute Post Oxygen Saturation % 96 %     2 Minute Post Liters of Oxygen 0 L       Oxygen Initial Assessment:  Oxygen Initial Assessment - 07/26/24 1416       Home Oxygen   Home Oxygen Device None    Sleep Oxygen Prescription CPAP    Liters per minute 0    Home Exercise Oxygen Prescription None    Home Resting Oxygen Prescription None    Compliance with Home Oxygen Use Yes      Initial 6 min Walk   Oxygen Used None      Program Oxygen Prescription   Program Oxygen Prescription None      Intervention   Short Term Goals To learn and demonstrate proper pursed lip breathing techniques or other breathing techniques. ;To learn and understand importance of maintaining oxygen saturations>88%;To learn and understand importance of monitoring SPO2 with pulse oximeter and demonstrate accurate use of the pulse oximeter.;To learn and exhibit compliance with exercise, home and travel O2 prescription;To learn and demonstrate proper use of respiratory medications    Long  Term Goals Maintenance of O2 saturations>88%;Verbalizes importance of monitoring SPO2 with  pulse oximeter and return demonstration;Exhibits proper breathing techniques, such as pursed lip breathing or other method taught during program session;Exhibits compliance with exercise, home  and travel O2 prescription;Compliance with respiratory medication;Demonstrates proper use of MDIs          Oxygen Re-Evaluation:  Oxygen Re-Evaluation     Row Name 08/03/24 0731 08/24/24 0747           Program Oxygen Prescription   Program Oxygen Prescription None None        Home Oxygen   Home Oxygen Device None None      Sleep Oxygen Prescription CPAP CPAP      Liters per minute 0 0  Home Exercise Oxygen Prescription None None      Home Resting Oxygen Prescription None None      Compliance with Home Oxygen Use Yes Yes        Goals/Expected Outcomes   Short Term Goals To learn and demonstrate proper pursed lip breathing techniques or other breathing techniques. ;To learn and understand importance of maintaining oxygen saturations>88%;To learn and understand importance of monitoring SPO2 with pulse oximeter and demonstrate accurate use of the pulse oximeter.;To learn and exhibit compliance with exercise, home and travel O2 prescription;To learn and demonstrate proper use of respiratory medications To learn and understand importance of maintaining oxygen saturations>88%      Long  Term Goals Maintenance of O2 saturations>88%;Verbalizes importance of monitoring SPO2 with pulse oximeter and return demonstration;Exhibits proper breathing techniques, such as pursed lip breathing or other method taught during program session;Exhibits compliance with exercise, home  and travel O2 prescription;Compliance with respiratory medication;Demonstrates proper use of MDIs Exhibits proper breathing techniques, such as pursed lip breathing or other method taught during program session      Comments Reviewed PLB technique with pt.  Talked about how it works and it's importance in maintaining their exercise  saturations. Reviewed PLB technique with pt. Talked about how it works and it's importance in maintaining their exercise saturations.      Goals/Expected Outcomes Short: Become more profiecient at using PLB. Long: Become independent at using PLB. Short: Become more profiecient at using PLB. Long: Become independent at using PLB.         Oxygen Discharge (Final Oxygen Re-Evaluation):  Oxygen Re-Evaluation - 08/24/24 0747       Program Oxygen Prescription   Program Oxygen Prescription None      Home Oxygen   Home Oxygen Device None    Sleep Oxygen Prescription CPAP    Liters per minute 0    Home Exercise Oxygen Prescription None    Home Resting Oxygen Prescription None    Compliance with Home Oxygen Use Yes      Goals/Expected Outcomes   Short Term Goals To learn and understand importance of maintaining oxygen saturations>88%    Long  Term Goals Exhibits proper breathing techniques, such as pursed lip breathing or other method taught during program session    Comments Reviewed PLB technique with pt. Talked about how it works and it's importance in maintaining their exercise saturations.    Goals/Expected Outcomes Short: Become more profiecient at using PLB. Long: Become independent at using PLB.          Initial Exercise Prescription:  Initial Exercise Prescription - 07/28/24 1500       Date of Initial Exercise RX and Referring Provider   Date 07/28/24    Referring Provider Cherrie Sieving, MD      Oxygen   Maintain Oxygen Saturation 88% or higher      Treadmill   MPH 1.5    Grade 0    Minutes 15    METs 2.15      Recumbant Bike   Level 2    Watts 15    Minutes 15    METs 2.8      NuStep   Level 2   T4 and T6   SPM 80    Minutes 15    METs 2.8      Arm Ergometer   Level 1    RPM 25    Minutes 15    METs 2.8      T5 Nustep  Level 2    SPM 80    Minutes 15    METs 2.8      Prescription Details   Frequency (times per week) 2    Duration Progress  to 30 minutes of continuous aerobic without signs/symptoms of physical distress      Intensity   THRR 40-80% of Max Heartrate 119-149    Ratings of Perceived Exertion 11-13    Perceived Dyspnea 0-4      Progression   Progression Continue to progress workloads to maintain intensity without signs/symptoms of physical distress.      Resistance Training   Training Prescription Yes    Weight 7 lb    Reps 10-15          Perform Capillary Blood Glucose checks as needed.  Exercise Prescription Changes:   Exercise Prescription Changes     Row Name 07/28/24 1500 08/18/24 1600           Response to Exercise   Blood Pressure (Admit) 150/88 102/62      Blood Pressure (Exercise) 158/80 160/72      Blood Pressure (Exit) 144/80 136/68      Heart Rate (Admit) 89 bpm 92 bpm      Heart Rate (Exercise) 105 bpm 100 bpm      Heart Rate (Exit) 92 bpm 90 bpm      Oxygen Saturation (Admit) 98 % 92 %      Oxygen Saturation (Exercise) 92 % 85 %      Oxygen Saturation (Exit) 96 % 97 %      Rating of Perceived Exertion (Exercise) 17 19      Perceived Dyspnea (Exercise) 2 4      Symptoms Bilateral hip pain 10/10 none      Comments results 1st week of exercise sessions      Duration -- Progress to 30 minutes of  aerobic without signs/symptoms of physical distress      Intensity -- THRR unchanged        Progression   Progression -- Continue to progress workloads to maintain intensity without signs/symptoms of physical distress.      Average METs 2.8 2.01        Resistance Training   Training Prescription -- Yes      Weight -- 7 lb      Reps -- 10-15        Interval Training   Interval Training -- No        Treadmill   MPH -- 1.5      Grade -- 0      Minutes -- 15      METs -- 2.15        NuStep   Level -- 3      Minutes -- 15      METs -- 3        Arm Ergometer   Level -- 1      Minutes -- 15      METs -- 1        T5 Nustep   Level -- 2  T6      Minutes -- 15       METs -- 1.9        Oxygen   Maintain Oxygen Saturation -- 88% or higher         Exercise Comments:   Exercise Comments     Row Name 08/03/24 0731 08/10/24 0735         Exercise  Comments First full day of exercise!  Patient was oriented to gym and equipment including functions, settings, policies, and procedures.  Patient's individual exercise prescription and treatment plan were reviewed.  All starting workloads were established based on the results of the 6 minute walk test done at initial orientation visit.  The plan for exercise progression was also introduced and progression will be customized based on patient's performance and goals. Rohit LOISE Darrel Raddle. did not complete his rehab session.  His BG was 322. Patient was educated by Leita, RN, to drink water , take sliding scale insulin  as prescribed, and notify doctor if BG does not go down. Patient stated understanding.         Exercise Goals and Review:   Exercise Goals     Row Name 07/28/24 1525             Exercise Goals   Increase Physical Activity Yes       Intervention Provide advice, education, support and counseling about physical activity/exercise needs.;Develop an individualized exercise prescription for aerobic and resistive training based on initial evaluation findings, risk stratification, comorbidities and participant's personal goals.       Expected Outcomes Short Term: Attend rehab on a regular basis to increase amount of physical activity.;Long Term: Add in home exercise to make exercise part of routine and to increase amount of physical activity.;Long Term: Exercising regularly at least 3-5 days a week.       Increase Strength and Stamina Yes       Intervention Provide advice, education, support and counseling about physical activity/exercise needs.;Develop an individualized exercise prescription for aerobic and resistive training based on initial evaluation findings, risk stratification, comorbidities and  participant's personal goals.       Expected Outcomes Short Term: Increase workloads from initial exercise prescription for resistance, speed, and METs.;Short Term: Perform resistance training exercises routinely during rehab and add in resistance training at home;Long Term: Improve cardiorespiratory fitness, muscular endurance and strength as measured by increased METs and functional capacity ( )       Able to understand and use rate of perceived exertion (RPE) scale Yes       Intervention Provide education and explanation on how to use RPE scale       Expected Outcomes Short Term: Able to use RPE daily in rehab to express subjective intensity level;Long Term:  Able to use RPE to guide intensity level when exercising independently       Able to understand and use Dyspnea scale Yes       Intervention Provide education and explanation on how to use Dyspnea scale       Expected Outcomes Short Term: Able to use Dyspnea scale daily in rehab to express subjective sense of shortness of breath during exertion;Long Term: Able to use Dyspnea scale to guide intensity level when exercising independently       Knowledge and understanding of Target Heart Rate Range (THRR) Yes       Intervention Provide education and explanation of THRR including how the numbers were predicted and where they are located for reference       Expected Outcomes Short Term: Able to state/look up THRR;Short Term: Able to use daily as guideline for intensity in rehab;Long Term: Able to use THRR to govern intensity when exercising independently       Able to check pulse independently Yes       Intervention Provide education and demonstration on how to check pulse in carotid and radial  arteries.;Review the importance of being able to check your own pulse for safety during independent exercise       Expected Outcomes Short Term: Able to explain why pulse checking is important during independent exercise;Long Term: Able to check pulse  independently and accurately       Understanding of Exercise Prescription Yes       Intervention Provide education, explanation, and written materials on patient's individual exercise prescription       Expected Outcomes Short Term: Able to explain program exercise prescription;Long Term: Able to explain home exercise prescription to exercise independently          Exercise Goals Re-Evaluation :  Exercise Goals Re-Evaluation     Row Name 08/03/24 0731 08/18/24 1614 08/23/24 1126 08/24/24 0745       Exercise Goal Re-Evaluation   Exercise Goals Review Increase Physical Activity;Able to understand and use rate of perceived exertion (RPE) scale;Knowledge and understanding of Target Heart Rate Range (THRR);Understanding of Exercise Prescription;Able to understand and use Dyspnea scale;Increase Strength and Stamina;Able to check pulse independently Increase Physical Activity;Understanding of Exercise Prescription;Increase Strength and Stamina Increase Physical Activity;Understanding of Exercise Prescription;Increase Strength and Stamina Increase Physical Activity;Increase Strength and Stamina;Understanding of Exercise Prescription    Comments Reviewed RPE and dyspnea scale, THR and program prescription with pt today.  Pt voiced understanding and was given a copy of goals to take home. Dhiren is off to a good start in the program and completed his first week in this review period. He walked at a speed of 1.5 mph on the treadmill with no incline. He worked at level 2 on the T6 nustep, level 3 on the T4 nustep, and level 1 on the arm ergometer. We will continue to monitor his progress in the program. Murry was unable to attend during this review period. We will continue to stay in contact woth him to determine his status in the program. Rami doing well at rehab, working on attending more frequently. Encouraged him to attend more consistently and continue to exercise at home.    Expected Outcomes Short:  Use RPE daily to regulate intensity. Long: Follow program prescription in THR. Short: Continue to follow current exercise prescription. Long: Continue exercise to improve strength and stamina. Short: Attend rehab. Long: continue exercise to improve strength and stamina. STG: Attend rehab more consistently. LTG: Continue to improve strength and stamina.       Discharge Exercise Prescription (Final Exercise Prescription Changes):  Exercise Prescription Changes - 08/18/24 1600       Response to Exercise   Blood Pressure (Admit) 102/62    Blood Pressure (Exercise) 160/72    Blood Pressure (Exit) 136/68    Heart Rate (Admit) 92 bpm    Heart Rate (Exercise) 100 bpm    Heart Rate (Exit) 90 bpm    Oxygen Saturation (Admit) 92 %    Oxygen Saturation (Exercise) 85 %    Oxygen Saturation (Exit) 97 %    Rating of Perceived Exertion (Exercise) 19    Perceived Dyspnea (Exercise) 4    Symptoms none    Comments 1st week of exercise sessions    Duration Progress to 30 minutes of  aerobic without signs/symptoms of physical distress    Intensity THRR unchanged      Progression   Progression Continue to progress workloads to maintain intensity without signs/symptoms of physical distress.    Average METs 2.01      Resistance Training   Training Prescription Yes  Weight 7 lb    Reps 10-15      Interval Training   Interval Training No      Treadmill   MPH 1.5    Grade 0    Minutes 15    METs 2.15      NuStep   Level 3    Minutes 15    METs 3      Arm Ergometer   Level 1    Minutes 15    METs 1      T5 Nustep   Level 2   T6   Minutes 15    METs 1.9      Oxygen   Maintain Oxygen Saturation 88% or higher          Nutrition:  Target Goals: Understanding of nutrition guidelines, daily intake of sodium 1500mg , cholesterol 200mg , calories 30% from fat and 7% or less from saturated fats, daily to have 5 or more servings of fruits and vegetables.  Education: Nutrition  1 -Group instruction provided by verbal, written material, interactive activities, discussions, models, and posters to present general guidelines for heart healthy nutrition including macronutrients, label reading, and promoting whole foods over processed counterparts. Education serves as pensions consultant of discussion of heart healthy eating for all. Written material provided at class time.     Education: Nutrition 2 -Group instruction provided by verbal, written material, interactive activities, discussions, models, and posters to present general guidelines for heart healthy nutrition including sodium, cholesterol, and saturated fat. Providing guidance of habit forming to improve blood pressure, cholesterol, and body weight. Written material provided at class time.     Biometrics:  Pre Biometrics - 07/28/24 1526       Pre Biometrics   Height 6' 0.4 (1.839 m)    Weight 267 lb (121.1 kg)    Waist Circumference 48.2 inches    Hip Circumference 42.2 inches    Waist to Hip Ratio 1.14 %    BMI (Calculated) 35.81    Single Leg Stand 15.9 seconds           Nutrition Therapy Plan and Nutrition Goals:  Nutrition Therapy & Goals - 07/28/24 1531       Nutrition Therapy   RD appointment deferred Yes      Personal Nutrition Goals   Nutrition Goal RD appointment deferred at this time.      Intervention Plan   Intervention Prescribe, educate and counsel regarding individualized specific dietary modifications aiming towards targeted core components such as weight, hypertension, lipid management, diabetes, heart failure and other comorbidities.    Expected Outcomes Short Term Goal: Understand basic principles of dietary content, such as calories, fat, sodium, cholesterol and nutrients.          Nutrition Assessments:  MEDIFICTS Score Key: >=70 Need to make dietary changes  40-70 Heart Healthy Diet <= 40 Therapeutic Level Cholesterol Diet  Flowsheet Row Pulmonary Rehab from 08/31/2024  in [facility] Cardiac and Pulmonary Rehab  Picture Your Plate Total Score on Admission 72   Picture Your Plate Scores: <59 Unhealthy dietary pattern with much room for improvement. 41-50 Dietary pattern unlikely to meet recommendations for good health and room for improvement. 51-60 More healthful dietary pattern, with some room for improvement.  >60 Healthy dietary pattern, although there may be some specific behaviors that could be improved.   Nutrition Goals Re-Evaluation:  Nutrition Goals Re-Evaluation     Row Name 08/24/24 [phone]  Goals   Comment RD appointment deferred.          Nutrition Goals Discharge (Final Nutrition Goals Re-Evaluation):  Nutrition Goals Re-Evaluation - 08/24/24 0750       Goals   Comment RD appointment deferred.          Psychosocial: Target Goals: Acknowledge presence or absence of significant depression and/or stress, maximize coping skills, provide positive support system. Participant is able to verbalize types and ability to use techniques and skills needed for reducing stress and depression.   Education: Stress, Anxiety, and Depression - Group verbal and visual presentation to define topics covered.  Reviews how body is impacted by stress, anxiety, and depression.  Also discusses healthy ways to reduce stress and to treat/manage anxiety and depression.  Written material provided at class time.   Education: Sleep Hygiene -Provides group verbal and written instruction about how sleep can affect your health.  Define sleep hygiene, discuss sleep cycles and impact of sleep habits. Review good sleep hygiene tips.    Initial Review & Psychosocial Screening:  Initial Psych Review & Screening - 07/26/24 1419       Initial Review   Current issues with None Identified      Family Dynamics   Good Support System? Yes    Comments He can look to his dad for support and is now getting health insurance and is less stressed than last time he  was in the program.      Barriers   Psychosocial barriers to participate in program The patient should benefit from training in stress management and relaxation.      Screening Interventions   Interventions Encouraged to exercise;To provide support and resources with identified psychosocial needs;Provide feedback about the scores to participant    Expected Outcomes Short Term goal: Utilizing psychosocial counselor, staff and physician to assist with identification of specific Stressors or current issues interfering with healing process. Setting desired goal for each stressor or current issue identified.;Long Term Goal: Stressors or current issues are controlled or eliminated.;Short Term goal: Identification and review with participant of any Quality of Life or Depression concerns found by scoring the questionnaire.;Long Term goal: The participant improves quality of Life and PHQ9 Scores as seen by post scores and/or verbalization of changes          Quality of Life Scores:  Scores of 19 and below usually indicate a poorer quality of life in these areas.  A difference of  2-3 points is a clinically meaningful difference.  A difference of 2-3 points in the total score of the Quality of Life Index has been associated with significant improvement in overall quality of life, self-image, physical symptoms, and general health in studies assessing change in quality of life.  PHQ-9: Review Flowsheet  More data exists      07/29/2024 07/28/2024 06/17/2024 05/26/2024 05/06/2024  Depression screen PHQ 2/9  Decreased Interest 1 1 1  0 0  Down, Depressed, Hopeless 0 0 0 0 0  PHQ - 2 Score 1 1 1  0 0  Altered sleeping 1 1 - - -  Tired, decreased energy 3 3 - - -  Change in appetite 1 1 - - -  Feeling bad or failure about yourself  0 0 - - -  Trouble concentrating 0 1 - - -  Moving slowly or fidgety/restless 0 0 - - -  Suicidal thoughts 0 0 - - -  PHQ-9 Score 6 7 - - -  Difficult doing work/chores  Somewhat difficult Somewhat difficult - - -   Interpretation of Total Score  Total Score Depression Severity:  1-4 = Minimal depression, 5-9 = Mild depression, 10-14 = Moderate depression, 15-19 = Moderately severe depression, 20-27 = Severe depression   Psychosocial Evaluation and Intervention:   Psychosocial Re-Evaluation:  Psychosocial Re-Evaluation     Row Name 08/24/24 [phone]             Psychosocial Re-Evaluation   Current issues with None Identified       Comments Jaggar denies any anxiety, depression, or stress at this time. He says he has a good support system. He reports he sleeps well most nights but does have nights where he wakes up and feels tired, sleeping later in the afternoon.       Expected Outcomes Short: Continue to look for positive and manage health Long: Use exercise for a mental boost          Psychosocial Discharge (Final Psychosocial Re-Evaluation):  Psychosocial Re-Evaluation - 08/24/24 0748       Psychosocial Re-Evaluation   Current issues with None Identified    Comments Florentino denies any anxiety, depression, or stress at this time. He says he has a good support system. He reports he sleeps well most nights but does have nights where he wakes up and feels tired, sleeping later in the afternoon.    Expected Outcomes Short: Continue to look for positive and manage health Long: Use exercise for a mental boost          Education: Education Goals: Education classes will be provided on a weekly basis, covering required topics. Participant will state understanding/return demonstration of topics presented.  Learning Barriers/Preferences:  Learning Barriers/Preferences - 07/26/24 1419       Learning Barriers/Preferences   Learning Barriers None    Learning Preferences Skilled Demonstration          General Pulmonary Education Topics:  Infection Prevention: - Provides verbal and written material to individual with discussion of infection  control including proper hand washing and proper equipment cleaning during exercise session. Flowsheet Row Pulmonary Rehab from 07/28/2024 in [facility] Cardiac and Pulmonary Rehab  Date 07/28/24  Educator MB  Instruction Review Code 1- Verbalizes Understanding    Falls Prevention: - Provides verbal and written material to individual with discussion of falls prevention and safety. Flowsheet Row Pulmonary Rehab from 07/28/2024 in [facility] Cardiac and Pulmonary Rehab  Date 07/28/24  Educator MB  Instruction Review Code 1- Verbalizes Understanding    Chronic Lung Disease Review: - Group verbal instruction with posters, models, PowerPoint presentations and videos,  to review new updates, new respiratory medications, new advancements in procedures and treatments. Providing information on websites and 800 numbers for continued self-education. Includes information about supplement oxygen, available portable oxygen systems, continuous and intermittent flow rates, oxygen safety, concentrators, and Medicare reimbursement for oxygen. Explanation of Pulmonary Drugs, including class, frequency, complications, importance of spacers, rinsing mouth after steroid MDI's, and proper cleaning methods for nebulizers. Review of basic lung anatomy and physiology related to function, structure, and complications of lung disease. Review of risk factors. Discussion about methods for diagnosing sleep apnea and types of masks and machines for OSA. Includes a review of the use of types of environmental controls: home humidity, furnaces, filters, dust mite/pet prevention, HEPA vacuums. Discussion about weather changes, air quality and the benefits of nasal washing. Instruction on Warning signs, infection symptoms, calling MD promptly, preventive modes, and value of vaccinations. Review of effective airway clearance,  coughing and/or vibration techniques. Emphasizing that all should Create an Action Plan. Written material provided at class  time. Flowsheet Row Pulmonary Rehab from 07/28/2024 in [facility] Cardiac and Pulmonary Rehab  Education need identified 07/28/24    AED/CPR: - Group verbal and written instruction with the use of models to demonstrate the basic use of the AED with the basic ABC's of resuscitation.    Tests and Procedures:  - Group verbal and visual presentation and models provide information about basic cardiac anatomy and function. Reviews the testing methods done to diagnose heart disease and the outcomes of the test results. Describes the treatment choices: Medical Management, Angioplasty, or Coronary Bypass Surgery for treating various heart conditions including Myocardial Infarction, Angina, Valve Disease, and Cardiac Arrhythmias.  Written material provided at class time. Flowsheet Row Cardiac Rehab from 08/08/2022 in [facility] Cardiac and Pulmonary Rehab  Date 07/25/22  Educator SB  Instruction Review Code 1- Verbalizes Understanding    Medication Safety: - Group verbal and visual instruction to review commonly prescribed medications for heart and lung disease. Reviews the medication, class of the drug, and side effects. Includes the steps to properly store meds and maintain the prescription regimen.  Written material given at graduation.   Other: -Provides group and verbal instruction on various topics (see comments)   Knowledge Questionnaire Score:  Knowledge Questionnaire Score - 07/28/24 1531       Knowledge Questionnaire Score   Pre Score 13/18           Core Components/Risk Factors/Patient Goals at Admission:  Personal Goals and Risk Factors at Admission - 07/28/24 1533       Core Components/Risk Factors/Patient Goals on Admission    Weight Management Yes;Weight Loss    Intervention Weight Management: Develop a combined nutrition and exercise program designed to reach desired caloric intake, while maintaining appropriate intake of nutrient and fiber, sodium and fats, and appropriate  energy expenditure required for the weight goal.;Weight Management: Provide education and appropriate resources to help participant work on and attain dietary goals.;Weight Management/Obesity: Establish reasonable short term and long term weight goals.    Admit Weight 267 lb (121.1 kg)    Goal Weight: Short Term 248 lb 8 oz (112.7 kg)    Goal Weight: Long Term 230 lb (104.3 kg)    Expected Outcomes Short Term: Continue to assess and modify interventions until short term weight is achieved;Weight Loss: Understanding of general recommendations for a balanced deficit meal plan, which promotes 1-2 lb weight loss per week and includes a negative energy balance of [phone] kcal/d;Understanding recommendations for meals to include 15-35% energy as protein, 25-35% energy from fat, 35-60% energy from carbohydrates, less than 200mg  of dietary cholesterol, 20-35 gm of total fiber daily;Understanding of distribution of calorie intake throughout the day with the consumption of 4-5 meals/snacks;Long Term: Adherence to nutrition and physical activity/exercise program aimed toward attainment of established weight goal    Improve shortness of breath with ADL's Yes    Intervention Provide education, individualized exercise plan and daily activity instruction to help decrease symptoms of SOB with activities of daily living.    Expected Outcomes Short Term: Improve cardiorespiratory fitness to achieve a reduction of symptoms when performing ADLs;Long Term: Be able to perform more ADLs without symptoms or delay the onset of symptoms    Diabetes Yes    Intervention Provide education about signs/symptoms and action to take for hypo/hyperglycemia.;Provide education about proper nutrition, including hydration, and aerobic/resistive exercise prescription along with prescribed medications to  achieve blood glucose in normal ranges: Fasting glucose 65-99 mg/dL    Expected Outcomes Short Term: Participant verbalizes understanding of  the signs/symptoms and immediate care of hyper/hypoglycemia, proper foot care and importance of medication, aerobic/resistive exercise and nutrition plan for blood glucose control.;Long Term: Attainment of HbA1C < 7%.    Heart Failure Yes    Intervention Provide a combined exercise and nutrition program that is supplemented with education, support and counseling about heart failure. Directed toward relieving symptoms such as shortness of breath, decreased exercise tolerance, and extremity edema.    Expected Outcomes Improve functional capacity of life;Short term: Attendance in program 2-3 days a week with increased exercise capacity. Reported lower sodium intake. Reported increased fruit and vegetable intake. Reports medication compliance.;Short term: Daily weights obtained and reported for increase. Utilizing diuretic protocols set by physician.;Long term: Adoption of self-care skills and reduction of barriers for early signs and symptoms recognition and intervention leading to self-care maintenance.    Hypertension Yes    Intervention Provide education on lifestyle modifcations including regular physical activity/exercise, weight management, moderate sodium restriction and increased consumption of fresh fruit, vegetables, and low fat dairy, alcohol moderation, and smoking cessation.;Monitor prescription use compliance.    Expected Outcomes Short Term: Continued assessment and intervention until BP is < 140/1mm HG in hypertensive participants. < 130/5mm HG in hypertensive participants with diabetes, heart failure or chronic kidney disease.;Long Term: Maintenance of blood pressure at goal levels.    Lipids Yes    Intervention Provide education and support for participant on nutrition & aerobic/resistive exercise along with prescribed medications to achieve LDL 70mg , HDL >40mg .    Expected Outcomes Short Term: Participant states understanding of desired cholesterol values and is compliant with medications  prescribed. Participant is following exercise prescription and nutrition guidelines.;Long Term: Cholesterol controlled with medications as prescribed, with individualized exercise RX and with personalized nutrition plan. Value goals: LDL < 70mg , HDL > 40 mg.          Education:Diabetes - Individual verbal and written instruction to review signs/symptoms of diabetes, desired ranges of glucose level fasting, after meals and with exercise. Acknowledge that pre and post exercise glucose checks will be done for 3 sessions at entry of program. Flowsheet Row Pulmonary Rehab from 07/28/2024 in [facility] Cardiac and Pulmonary Rehab  Date 07/28/24  Educator MB  Instruction Review Code 1- Verbalizes Understanding    Know Your Numbers and Heart Failure: - Group verbal and visual instruction to discuss disease risk factors for cardiac and pulmonary disease and treatment options.  Reviews associated critical values for Overweight/Obesity, Hypertension, Cholesterol, and Diabetes.  Discusses basics of heart failure: signs/symptoms and treatments.  Introduces Heart Failure Zone chart for action plan for heart failure. Written material provided at class time. Flowsheet Row Cardiac Rehab from 08/08/2022 in [facility] Cardiac and Pulmonary Rehab  Education need identified 07/01/22  Date 08/08/22  Educator SB  Instruction Review Code 1- Verbalizes Understanding    Core Components/Risk Factors/Patient Goals Review:   Goals and Risk Factor Review     Row Name 08/24/24 0751             Core Components/Risk Factors/Patient Goals Review   Personal Goals Review Weight Management/Obesity;Improve shortness of breath with ADL's       Review Jamail reports he has been trying to eat better and lose weight. Spoke with him about healthy eating that would include more low caloire dense foods. He reports he has seen some improvement in breathing since starting rehab.  Expected Outcomes STG: Eat more low caloire dense  foods and attend rehab more consistently. LTG: Manage risk factors independently          Core Components/Risk Factors/Patient Goals at Discharge (Final Review):   Goals and Risk Factor Review - 08/24/24 0751       Core Components/Risk Factors/Patient Goals Review   Personal Goals Review Weight Management/Obesity;Improve shortness of breath with ADL's    Review Athan reports he has been trying to eat better and lose weight. Spoke with him about healthy eating that would include more low caloire dense foods. He reports he has seen some improvement in breathing since starting rehab.    Expected Outcomes STG: Eat more low caloire dense foods and attend rehab more consistently. LTG: Manage risk factors independently          ITP Comments:  ITP Comments     Row Name 07/26/24 1424 07/28/24 1512 08/03/24 0731 08/04/24 1315 08/10/24 0735   ITP Comments Virtual Visit completed. Patient informed on EP and RD appointment and 6 Minute walk test. Patient also informed of patient health questionnaires on My Chart. Patient Verbalizes understanding. Visit diagnosis can be found in [facility] 06/28/2024. Completed and gym orientation for respiratory care services. Initial ITP created and sent for review to Dr. Fuad Aleskerov, Medical Director. First full day of exercise!  Patient was oriented to gym and equipment including functions, settings, policies, and procedures.  Patient's individual exercise prescription and treatment plan were reviewed.  All starting workloads were established based on the results of the 6 minute walk test done at initial orientation visit.  The plan for exercise progression was also introduced and progression will be customized based on patient's performance and goals. 30 Day review completed. Medical Director ITP review done, changes made as directed, and signed approval by Medical Director. New to program [employer]. did not complete his rehab session.  His BG was 322.  Patient was educated by Leita, RN, to drink water , take sliding scale insulin  as prescribed, and notify doctor if BG does not go down. Patient stated understanding.    Row Name 09/01/24 1036           ITP Comments 30 Day review completed. Medical Director ITP review done, changes made as directed, and signed approval by Medical Director. New to program.          Comments: 30 day review      [1]  Current Outpatient Medications:    Accu-Chek Softclix Lancets lancets, USE UP TO FOUR TIMES DAILY, Disp: , Rfl:    albuterol  (VENTOLIN  HFA) 108 (90 Base) MCG/ACT inhaler, Inhale 2 puffs into the lungs every 4 (four) hours as needed for wheezing or shortness of breath., Disp: 8 g, Rfl: 6   allopurinol  (ZYLOPRIM ) 300 MG tablet, Take 300 mg by mouth every morning., Disp: , Rfl:    amiodarone  (PACERONE ) 200 MG tablet, Take 1 tablet (200 mg total) by mouth daily., Disp: 90 tablet, Rfl: 3   BD PEN NEEDLE NANO 2ND GEN 32G X 4 MM MISC, USE  AS DIRECTED AT BEDTIME, Disp: 200 each, Rfl: 0   blood glucose meter kit and supplies KIT, Dispense based on patient and insurance preference. Use up to four times daily as directed., Disp: 1 each, Rfl: 11   Bumetanide  (ENBUMYST ) 0.5 MG/0.1ML SOLN, Place 1 mg into the nose daily as needed (for weight gain with shortness of breath and swelling)., Disp: 24 each, Rfl: 1   carvedilol  (  COREG ) 3.125 MG tablet, Take 1 tablet (3.125 mg total) by mouth 2 (two) times daily with a meal., Disp: 60 tablet, Rfl: 3   Continuous Glucose Sensor (FREESTYLE LIBRE 3 PLUS SENSOR) MISC, Place 1 sensor on the skin every 15 days. Use to check glucose continuously, Disp: 2 each, Rfl: 12   dapagliflozin  propanediol (FARXIGA ) 10 MG TABS tablet, Take 1 tablet (10 mg total) by mouth daily., Disp: 90 tablet, Rfl: 3   Digoxin  62.5 MCG TABS, Take 0.0625 mg by mouth daily., Disp: 30 tablet, Rfl: 5   ezetimibe  (ZETIA ) 10 MG tablet, Take 1 tablet (10 mg total) by mouth daily., Disp: 90 tablet, Rfl: 3    hydrALAZINE  (APRESOLINE ) 25 MG tablet, Take 1 tablet (25 mg total) by mouth 2 (two) times daily., Disp: , Rfl:    insulin  glargine (LANTUS  SOLOSTAR) 100 UNIT/ML Solostar Pen, Inject 55 Units into the skin daily. Decrease by 2 units every 2 days if fasting blood glucose is less than 90., Disp: 15 mL, Rfl: 11   insulin  lispro (HUMALOG ) 100 UNIT/ML KwikPen, Inject 20 Units into the skin 3 (three) times daily. + CORRECTION BEFORE BREAKFAST/LUNCH AND SUPPER (CORRECTION = 1 UNIT PER 50 MG/DL ABOVE 849); MAX DAILY DOSE 77 UNITS, Disp: 15 mL, Rfl: 11   Insulin  Pen Needle 32G X 4 MM MISC, USE WITH INSULIN  IN THE MORNING, AT NOON, IN THE EVENING AND AT BEDTIME, Disp: 400 each, Rfl: 11   ipratropium (ATROVENT ) 0.06 % nasal spray, Place 2 sprays into both nostrils every 8 (eight) hours., Disp: , Rfl:    ipratropium-albuterol  (DUONEB) 0.5-2.5 (3) MG/3ML SOLN, Take 3 mLs by nebulization every 6 (six) hours as needed., Disp: 360 mL, Rfl: 11   meclizine  (ANTIVERT ) 25 MG tablet, Take 0.5 tablets (12.5 mg total) by mouth 3 (three) times daily as needed for dizziness., Disp: , Rfl:    nitroGLYCERIN  (NITROSTAT ) 0.4 MG SL tablet, Place 1 tablet (0.4 mg total) under the tongue every 5 (five) minutes x 3 doses as needed for chest pain., Disp: 30 tablet, Rfl: 3   omeprazole  (PRILOSEC) 40 MG capsule, Take 1 capsule (40 mg total) by mouth daily., Disp: 90 capsule, Rfl: 2   potassium chloride  SA (KLOR-CON  M) 20 MEQ tablet, Take 1 tablet (20 mEq total) by mouth daily. AM, Disp: 90 tablet, Rfl: 3   rivaroxaban  (XARELTO ) 20 MG TABS tablet, Take 1 tablet (20 mg total) by mouth daily with supper., Disp: 90 tablet, Rfl: 3   rosuvastatin  (CRESTOR ) 20 MG tablet, Take 1 tablet (20 mg total) by mouth daily., Disp: 90 tablet, Rfl: 3   sacubitril -valsartan  (ENTRESTO ) 24-26 MG, Take 1 tablet by mouth 2 (two) times daily., Disp: , Rfl:    tirzepatide  (MOUNJARO ) 15 MG/0.5ML Pen, Inject 15 mg into the skin once a week., Disp: 6 mL, Rfl: 3    Torsemide  60 MG TABS, Take 60 mg by mouth daily., Disp: 90 tablet, Rfl: 0   Vericiguat (VERQUVO) 2.5 MG TABS, Take 1 tablet (2.5 mg total) by mouth daily., Disp: 30 tablet, Rfl: 5 [2]  Social History Tobacco Use  Smoking Status Former   Current packs/day: 0.00   Average packs/day: 0.5 packs/day for 33.0 years (16.5 ttl pk-yrs)   Types: Cigarettes   Start date: 05/09/1987   Quit date: 05/08/2020   Years since quitting: 4.3  Smokeless Tobacco Former   Quit date: 05/08/2020  Tobacco Comments   Quit Sept 2021

## 2024-09-02 ENCOUNTER — Ambulatory Visit: Admitting: Sleep Medicine

## 2024-09-02 ENCOUNTER — Encounter: Admitting: Emergency Medicine

## 2024-09-02 ENCOUNTER — Encounter: Payer: Self-pay | Admitting: Sleep Medicine

## 2024-09-02 VITALS — BP 110/70 | HR 96 | Temp 97.8°F | Ht 71.0 in | Wt 268.4 lb

## 2024-09-02 DIAGNOSIS — Z87891 Personal history of nicotine dependence: Secondary | ICD-10-CM | POA: Diagnosis not present

## 2024-09-02 DIAGNOSIS — Z6837 Body mass index (BMI) 37.0-37.9, adult: Secondary | ICD-10-CM | POA: Diagnosis not present

## 2024-09-02 DIAGNOSIS — I5022 Chronic systolic (congestive) heart failure: Secondary | ICD-10-CM

## 2024-09-02 DIAGNOSIS — I509 Heart failure, unspecified: Secondary | ICD-10-CM | POA: Diagnosis not present

## 2024-09-02 DIAGNOSIS — E669 Obesity, unspecified: Secondary | ICD-10-CM

## 2024-09-02 DIAGNOSIS — G4733 Obstructive sleep apnea (adult) (pediatric): Secondary | ICD-10-CM | POA: Diagnosis not present

## 2024-09-02 DIAGNOSIS — I130 Hypertensive heart and chronic kidney disease with heart failure and stage 1 through stage 4 chronic kidney disease, or unspecified chronic kidney disease: Secondary | ICD-10-CM | POA: Diagnosis not present

## 2024-09-02 NOTE — Progress Notes (Signed)
 Daily Session Note  Patient Details  Name: Gabriel [name]. MRN: [id number] Date of Birth: 12-07-1970 Referring Provider:   Flowsheet Row Pulmonary Rehab from 07/28/2024 in [facility] Cardiac and Pulmonary Rehab  Referring Provider Cherrie Sieving, MD    Encounter Date: 09/02/2024  Check In:  Session Check In - 09/02/24 0724       Check-In   Supervising physician immediately available to respond to emergencies See telemetry face sheet for immediately available ER MD    Location [facility]-Cardiac & Pulmonary Rehab    Staff Present Leita Franks RN,BSN;[facility] RCP,RRT,BSRT;Noah Tickle, [place], Exercise Physiologist;Jason Elnor [facility]    Virtual Visit No    Medication changes reported     No    Fall or balance concerns reported    No    Warm-up and Cool-down Performed on first and last piece of equipment    Resistance Training Performed Yes    VAD Patient? No    PAD/SET Patient? No      Pain Assessment   Currently in Pain? No/denies             Tobacco Use History[1]  Goals Met:  Proper associated with RPD/PD & O2 Sat Independence with exercise equipment Using PLB without cueing & demonstrates good technique Exercise tolerated well No report of concerns or symptoms today Strength training completed today  Goals Unmet:  Not Applicable  Comments: Pt able to follow exercise prescription today without complaint.  Will continue to monitor for progression.    Dr. Oneil Pinal is Medical Director for [facility] Cardiac Rehabilitation.  Dr. Fuad Aleskerov is Medical Director for [facility] Pulmonary Rehabilitation.    [1]  Social History Tobacco Use  Smoking Status Former   Current packs/day: 0.00   Average packs/day: 0.5 packs/day for 33.0 years (16.5 ttl pk-yrs)   Types: Cigarettes   Start date: 05/09/1987   Quit date: 05/08/2020   Years since quitting: 4.3  Smokeless Tobacco Former   Quit date: 05/08/2020  Tobacco Comments   Quit Sept 2021

## 2024-09-02 NOTE — Progress Notes (Signed)
 "      Name:Gabriel [name]. MRN: [id number] DOB: 04/23/1971   CHIEF COMPLAINT:  REASSESSMENT OF OSA   HISTORY OF PRESENT ILLNESS: Gabriel [name] is a 54 y.o. w/ a h/o OSA, DMII, SVT, CHF, CAD, COPD and obesity who presents for reassessment of OSA. Reports discontinuing BIPAP therapy last year due to pressure discomfort. Reports c/o loud snoring and excessive daytime sleepiness which has been present for several years. Reports nocturnal awakenings due to nocturia, however does not have difficulty falling back to sleep. Reports significant weight changes. Admits to dry mouth. Denies morning headaches, RLS symptoms, dream enactment, cataplexy, hypnagogic or hypnapompic hallucinations. Reports a family history of sleep apnea. Denies drowsy driving. Drinks 1 gallon of tea daily, denies alcohol, tobacco or illicit drug use.   Bedtime 8:30-10 pm Sleep onset varies Rise time 6-7 am   EPWORTH SLEEP SCORE 16    09/02/2024    9:00 AM  Results of the Epworth flowsheet  Sitting and reading 3  Watching TV 3  Sitting, inactive in a public place (e.g. a theatre or a meeting) 3  As a passenger in a car for an hour without a break 1  Lying down to rest in the afternoon when circumstances permit 3  Sitting and talking to someone 0  Sitting quietly after a lunch without alcohol 3  In a car, while stopped for a few minutes in traffic 0  Total score 16    PAST MEDICAL HISTORY :   has a past medical history of Anginal pain, Aortic atherosclerosis, Aortic root dilation (11/28/2016), Arteriosclerosis of right carotid artery, Atrial fibrillation with RVR (HCC), CAD (coronary artery disease), Cardiomegaly, Cerebellar infarct (HCC), Chest wall pain, chronic, Chronic 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, Chronic Troponin Elevation, CKD (chronic kidney disease), stage II-III, COPD (chronic obstructive pulmonary disease) (HCC), CPAP (continuous positive airway pressure) dependence (03/08/2023), Depression, DM (diabetes  mellitus), type 2 (HCC), Dyspnea, GERD (gastroesophageal reflux disease), Headache, HFrEF (heart failure with reduced ejection fraction) (HCC), History of 2019 novel coronavirus disease (COVID-19) (03/01/2022), History of kidney stones, History of methicillin resistant staphylococcus aureus (MRSA), Hyperlipidemia, Hypertension, 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), Left rotator cuff tear, Liver cirrhosis secondary to NASH (HCC), Long term current use of amiodarone , Long term current use of immunosuppressive drug, Long-term use of aspirin  therapy, Mediastinal lymphadenopathy, Mitral regurgitation, 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  has a past surgical history that includes Finger amputation (Left); LEFT HEART CATH AND CORONARY ANGIOGRAPHY (N/A, 01/06/2017); RIGHT/LEFT HEART CATH AND CORONARY ANGIOGRAPHY (N/A, 06/13/2020); SUBQ ICD IMPLANT (N/A, 05/20/2022); Colonoscopy with propofol  (N/A, 08/01/2022); Esophagogastroduodenoscopy (egd) with propofol  (N/A, 08/01/2022); Esophagogastroduodenoscopy (egd) with propofol  (N/A, 10/16/2022); RIGHT/LEFT HEART CATH AND CORONARY ANGIOGRAPHY (Bilateral, 08/19/2023); Cardioversion (N/A, 11/06/2023); Shoulder arthroscopy with subacromial decompression, rotator cuff repair and bicep tendon repair (Left, 09/16/2023); cardioverted 11/06/23; Lesion excision (N/A, 02/16/2024); RIGHT HEART CATH (N/A, 06/23/2024); and RIGHT HEART CATH (N/A, 07/13/2024). Prior to Admission medications  Medication  Sig Start Date End Date Taking? Authorizing Provider  Accu-Chek Softclix Lancets lancets USE UP TO FOUR TIMES DAILY 07/10/22  Yes [provider]  albuterol  (VENTOLIN  HFA) 108 (90 Base) MCG/ACT inhaler Inhale 2 puffs into the lungs every 4 (four)  hours as needed for wheezing or shortness of breath. 08/26/24  Yes Dgayli, Belva, MD  allopurinol  (ZYLOPRIM ) 300 MG tablet Take 300 mg by mouth every morning.   Yes [provider]  amiodarone  (PACERONE ) 200 MG tablet Take 1 tablet (200 mg total) by mouth daily. 01/14/24  Yes Cindie Ole DASEN, MD  BD PEN NEEDLE NANO 2ND GEN 32G X 4 MM MISC USE  AS DIRECTED AT BEDTIME 02/03/23  Yes Emilio Kelly DASEN, FNP  blood glucose meter kit and supplies KIT Dispense based on patient and insurance preference. Use up to four times daily as directed. 07/08/22  Yes Emilio Kelly DASEN, FNP  Bumetanide  (ENBUMYST ) 0.5 MG/0.1ML SOLN Place 1 mg into the nose daily as needed (for weight gain with shortness of breath and swelling). 07/26/24  Yes Donette City A, FNP  carvedilol  (COREG ) 3.125 MG tablet Take 1 tablet (3.125 mg total) by mouth 2 (two) times daily with a meal. 06/02/24  Yes Clifton, Curtis A, FNP  Continuous Glucose Sensor (FREESTYLE LIBRE 3 PLUS SENSOR) MISC Place 1 sensor on the skin every 15 days. Use to check glucose continuously 02/17/24  Yes [name] A, FNP  dapagliflozin  propanediol (FARXIGA ) 10 MG TABS tablet Take 1 tablet (10 mg total) by mouth daily. 07/07/23  Yes Donette City A, FNP  Digoxin  62.5 MCG TABS Take 0.0625 mg by mouth daily. 08/04/24  Yes Hackney, City A, FNP  ezetimibe  (ZETIA ) 10 MG tablet Take 1 tablet (10 mg total) by mouth daily. 02/27/24  Yes Gollan, Timothy J, MD  hydrALAZINE  (APRESOLINE ) 25 MG tablet Take 1 tablet (25 mg total) by mouth 2 (two) times daily. 08/04/24  Yes Donette City A, FNP  insulin  glargine (LANTUS  SOLOSTAR) 100 UNIT/ML Solostar Pen Inject 55 Units into the skin daily. Decrease by 2 units every 2 days if  fasting blood glucose is less than 90. 07/29/24  Yes Franchot Isaiah LABOR, MD  insulin  lispro (HUMALOG ) 100 UNIT/ML KwikPen Inject 20 Units into the skin 3 (three) times daily. + CORRECTION BEFORE BREAKFAST/LUNCH AND SUPPER (CORRECTION = 1 UNIT PER 50 MG/DL ABOVE 849); MAX DAILY DOSE 77 UNITS 06/14/24  Yes Clifton, Curtis A, FNP  Insulin  Pen Needle 32G X 4 MM MISC USE WITH INSULIN  IN THE MORNING, AT NOON, IN THE EVENING AND AT BEDTIME 04/13/24  Yes Clifton, Kellie A, FNP  ipratropium (ATROVENT ) 0.06 % nasal spray Place 2 sprays into both nostrils every 8 (eight) hours. 06/15/24  Yes [provider]  ipratropium-albuterol  (DUONEB) 0.5-2.5 (3) MG/3ML SOLN Take 3 mLs by nebulization every 6 (six) hours as needed. 07/31/23  Yes Clifton, Kellie A, FNP  meclizine  (ANTIVERT ) 25 MG tablet Take 0.5 tablets (12.5 mg total) by mouth 3 (three) times daily as needed for dizziness. 09/16/23  Yes Poggi, Norleen PARAS, MD  nitroGLYCERIN  (NITROSTAT ) 0.4 MG SL tablet Place 1 tablet (0.4 mg total) under the tongue every 5 (five) minutes x 3 doses as needed for chest pain. 02/24/24  Yes Gollan, Timothy J, MD  omeprazole  (PRILOSEC) 40 MG capsule Take 1 capsule (40 mg total) by mouth daily. 06/17/24  Yes Clifton, Kellie A, FNP  potassium chloride  SA (KLOR-CON  M) 20 MEQ tablet Take 1 tablet (20 mEq total) by mouth daily. AM 07/23/24  Yes Donette City A, FNP  rivaroxaban  (XARELTO ) 20 MG TABS tablet Take 1 tablet (20 mg total) by mouth daily with supper. 07/01/23  Yes Bensimhon, Toribio SAUNDERS, MD  rosuvastatin  (CRESTOR ) 20 MG tablet Take 1 tablet (20 mg total)  by mouth daily. 03/24/24  Yes Wellington Beams A, FNP  sacubitril -valsartan  (ENTRESTO ) 24-26 MG Take 1 tablet by mouth 2 (two) times daily. 08/04/24  Yes Hackney, Ellouise A, FNP  tirzepatide  (MOUNJARO ) 15 MG/0.5ML Pen Inject 15 mg into the skin once a week. 07/29/24  Yes Franchot Isaiah LABOR, MD  Torsemide  60 MG TABS Take 60 mg by mouth daily. 07/14/24  Yes Alexander, Natalie, DO   Vericiguat (VERQUVO) 2.5 MG TABS Take 1 tablet (2.5 mg total) by mouth daily. 08/04/24  Yes Donette Ellouise LABOR, FNP   Allergies[1]  FAMILY HISTORY:  family history includes Cancer in his maternal grandfather and paternal aunt; Diabetes in his maternal grandfather and mother; Diabetes Mellitus II in his mother; Gout in his father; Heart attack in his mother; Hypertension in his father and mother; Hypothyroidism in his mother; Kidney failure in his mother. SOCIAL HISTORY:  reports that he quit smoking about 4 years ago. His smoking use included cigarettes. He started smoking about 37 years ago. He has a 16.5 pack-year smoking history. He quit smokeless tobacco use about 4 years ago. He reports that he does not currently use alcohol. He reports that he does not use drugs.   Review of Systems:  Gen:  Denies  fever, sweats, chills weight loss  HEENT: Denies blurred vision, double vision, ear pain, eye pain, hearing loss, nose bleeds, sore throat Cardiac:  No dizziness, chest pain or heaviness, chest tightness,edema, No JVD Resp:   No cough, -sputum production, -shortness of breath,-wheezing, -hemoptysis,  Gi: Denies swallowing difficulty, stomach pain, nausea or vomiting, diarrhea, constipation, bowel incontinence Gu:  Denies bladder incontinence, burning urine Ext:   Denies Joint pain, stiffness or swelling Skin: Denies  skin rash, easy bruising or bleeding or hives Endoc:  Denies polyuria, polydipsia , polyphagia or weight change Psych:   Denies depression, insomnia or hallucinations  Other:  All other systems negative  VITAL SIGNS: BP 110/70   Pulse 96   Temp 97.8 F (36.6 C)   Ht 5' 11 (1.803 m)   Wt 268 lb 6.4 oz (121.7 kg)   SpO2 95%   BMI 37.43 kg/m    Physical Examination:   General Appearance: No distress  EYES PERRLA, EOM intact.   NECK Supple, No JVD Pulmonary: normal breath sounds, No wheezing.  CardiovascularNormal S1,S2.  No m/r/g.   Abdomen: Benign, Soft,  non-tender. Skin:   warm, no rashes, no ecchymosis  Extremities: normal, no cyanosis, clubbing. Neuro:without focal findings,  speech normal  PSYCHIATRIC: Mood, affect within normal limits.   ASSESSMENT AND PLAN  OSA Due to weight gain, will reassess apnea with in lab CPAP/BIPAP titration study. Discussed the consequences of untreated sleep apnea. Advised not to drive drowsy for safety of patient and others. Will complete further evaluation with in lab titration study and follow up to review results.    CHF Stable, on current management. Following with cardiology.  Obesity Counseled patient on diet and lifestyle modification.     MEDICATION ADJUSTMENTS/LABS AND TESTS ORDERED: Recommend Sleep Study   Patient  satisfied with Plan of action and management. All questions answered  Follow up to review titration study results and treatment plan.   I spent a total of 48 minutes reviewing chart data, face-to-face evaluation with the patient, counseling and coordination of care as detailed above.    Daeja Helderman, M.D.  Sleep Medicine [facility]           [1]  Allergies Allergen Reactions  Metformin  And Related Nausea And Vomiting   Ranitidine  Other (See Comments)   Zantac  [Ranitidine  Hcl] Diarrhea and Nausea Only    Night sweats   "

## 2024-09-06 ENCOUNTER — Encounter: Payer: Self-pay | Admitting: Oncology

## 2024-09-07 ENCOUNTER — Encounter

## 2024-09-07 DIAGNOSIS — I5022 Chronic systolic (congestive) heart failure: Secondary | ICD-10-CM

## 2024-09-07 DIAGNOSIS — I130 Hypertensive heart and chronic kidney disease with heart failure and stage 1 through stage 4 chronic kidney disease, or unspecified chronic kidney disease: Secondary | ICD-10-CM | POA: Diagnosis not present

## 2024-09-07 NOTE — Progress Notes (Signed)
 Daily Session Note  Patient Details  Name: Gabriel [name]. MRN: [id number] Date of Birth: 19-Nov-1970 Referring Provider:   Flowsheet Row Pulmonary Rehab from 07/28/2024 in [facility] Cardiac and Pulmonary Rehab  Referring Provider Cherrie Sieving, MD    Encounter Date: 09/07/2024  Check In:  Session Check In - 09/07/24 0720       Check-In   Supervising physician immediately available to respond to emergencies See telemetry face sheet for immediately available ER MD    Location [facility]-Cardiac & Pulmonary Rehab    Staff Present Burnard Davenport RN,BSN,MPA;Noah Tickle, BS, Exercise Physiologist;Margaret Best, MS, Exercise Physiologist;Jason Elnor RDN,LDN    Virtual Visit No    Medication changes reported     No    Fall or balance concerns reported    No    Warm-up and Cool-down Performed on first and last piece of equipment    Resistance Training Performed Yes    VAD Patient? No    PAD/SET Patient? No      Pain Assessment   Currently in Pain? No/denies             Tobacco Use History[1]  Goals Met:  Proper associated with RPD/PD & O2 Sat Independence with exercise equipment Using PLB without cueing & demonstrates good technique Exercise tolerated well No report of concerns or symptoms today Strength training completed today  Goals Unmet:  Not Applicable  Comments: Pt able to follow exercise prescription today without complaint.  Will continue to monitor for progression.    Dr. Oneil Pinal is Medical Director for [facility] Cardiac Rehabilitation.  Dr. Fuad Aleskerov is Medical Director for [facility] Pulmonary Rehabilitation.    [1]  Social History Tobacco Use  Smoking Status Former   Current packs/day: 0.00   Average packs/day: 0.5 packs/day for 33.0 years (16.5 ttl pk-yrs)   Types: Cigarettes   Start date: 05/09/1987   Quit date: 05/08/2020   Years since quitting: 4.3  Smokeless Tobacco Former   Quit date: 05/08/2020  Tobacco Comments   Quit Sept 2021

## 2024-09-09 ENCOUNTER — Encounter

## 2024-09-09 MED FILL — Continuous Glucose System Sensor: 30 days supply | Qty: 2 | Fill #7 | Status: AC

## 2024-09-09 NOTE — Addendum Note (Signed)
 Addended by: Ailene Royal on: 09/09/2024 12:57 PM   Modules accepted: Level of Service

## 2024-09-14 ENCOUNTER — Encounter

## 2024-09-15 ENCOUNTER — Ambulatory Visit (INDEPENDENT_AMBULATORY_CARE_PROVIDER_SITE_OTHER)

## 2024-09-15 DIAGNOSIS — E1122 Type 2 diabetes mellitus with diabetic chronic kidney disease: Secondary | ICD-10-CM

## 2024-09-15 DIAGNOSIS — Z794 Long term (current) use of insulin: Secondary | ICD-10-CM

## 2024-09-15 DIAGNOSIS — N1831 Chronic kidney disease, stage 3a: Secondary | ICD-10-CM

## 2024-09-15 MED ORDER — INSULIN LISPRO 100 UNIT/ML IJ SOLN: INTRAMUSCULAR | 2 refills | Status: AC

## 2024-09-15 MED ORDER — OMNIPOD 5 LIBRE2 G6 INTRO GEN5 KIT: PACK | 0 refills | Status: AC

## 2024-09-15 MED ORDER — OMNIPOD 5 LIBRE2 PLUS G6 PODS MISC: 5 refills | Status: AC

## 2024-09-15 MED ORDER — FREESTYLE LIBRE 2 PLUS SENSOR MISC: 3 refills | Status: AC

## 2024-09-15 NOTE — Progress Notes (Signed)
 "  S:     Reason for visit: ?  Gabriel [name]. is a 54 y.o. male with a history of diabetes (type 2), who presents today for a follow up diabetes Face to Face pharmacotherapy visit.? Pertinent PMH also includes CAD, NASH, CKD stage 3b, hx of PE, ICD, psoriatic arthritis, COPD, Afib, HFrEF.   They were referred to the pharmacist by their PCP for assistance in managing diabetes.   Care Team: Primary Care Provider: Franchot Isaiah LABOR, MD  At last visit with endocrinology on 08/24/24, A1c was improved at 8.2%. He was instructed to increase Lantus  to 65 units and Humalog  to 28 units BID  Today he reports he continued his previous regimen of Lantus  and Humalog  since last visit. He also requested information on Omnipod insulin  pump. He reports he has a friend who uses this and reports success with it.   Current diabetes medications include: Mounjaro  15 mg weekly, Lantus  65 units daily (taking 55 units), Humalog  28 units BID with meals + 1 unit correction for every 50 mg/dL above 849 prior to meals (taking 25 units TID + correction), Farxiga  10 mg daily Previous diabetes medications include: Semglee , Lantus , Humalog , Victoza, metformin , Januvia, and Ozempic   Current hypertension medications include: carvedilol  3.125 mg BID, torsemide  40 mg daily, bumetanide  1 mg EN prn, torsemide  60 mg daily, Entresto  24/26 mg BID Current hyperlipidemia medications include: ezetimibe  10 mg daily,  rosuvastatin  20 mg daily  Patient reports adherence to taking all medications, however, not as prescribed for his insulin  medications. See above.   Have you been experiencing any side effects to the medications prescribed? no Do you have any problems obtaining medications due to transportation or finances? no Insurance coverage: [facility] Medicare  Patient denies hypoglycemic events.  DM Prevention:  Statin: Taking; high intensity.?  History of chronic kidney disease? yes History of albuminuria? yes, last UACR on 04/02/24  = 43 mg/g Last eye exam:  Lab Results  Component Value Date   HMDIABEYEEXA No Retinopathy 05/04/2024   Tobacco Use:  Tobacco Use: Medium Risk (09/02/2024)   Patient History    Smoking Tobacco Use: Former    Smokeless Tobacco Use: Former    Passive Exposure: Not on file   O:  LibreView Report    Vitals:  Wt Readings from Last 3 Encounters:  09/02/24 268 lb 6.4 oz (121.7 kg)  08/26/24 262 lb 3.2 oz (118.9 kg)  08/26/24 264 lb (119.7 kg)   BP Readings from Last 3 Encounters:  09/02/24 110/70  08/26/24 108/62  08/26/24 (!) 88/68   Pulse Readings from Last 3 Encounters:  09/02/24 96  08/26/24 83  08/26/24 89     Labs:?  Lab Results  Component Value Date   HGBA1C 8.8 (A) 04/02/2024   HGBA1C 6.9 (H) 01/01/2024   HGBA1C 6.6 (A) 07/11/2023   GLUCOSE 327 (H) 07/27/2024   MICRALBCREAT 43 (H) 04/02/2024   MICRALBCREAT 25 04/16/2023   MICRALBCREAT 49 (H) 02/08/2022   CREATININE 1.27 (H) 07/27/2024   CREATININE 1.70 (H) 07/21/2024   CREATININE 1.76 (H) 07/14/2024    Lab Results  Component Value Date   CHOL 180 01/01/2024   LDLCALC 94 01/01/2024   LDLCALC 54 12/05/2023   LDLCALC 62 08/09/2022   LDLDIRECT 60.6 11/23/2020   LDLDIRECT 65.3 01/18/2020   HDL 29 (L) 01/01/2024   TRIG 341 (H) 01/01/2024   TRIG 344 (H) 01/01/2024   TRIG 216 (H) 12/05/2023   ALT 15 07/09/2024   ALT 21 07/06/2024  AST 19 07/09/2024   AST 22 07/06/2024      Chemistry      Component Value Date/Time   NA 138 07/27/2024 0952   NA 137 04/06/2024 1224   NA 139 10/07/2013 1703   K 3.8 07/27/2024 0952   K 3.8 10/07/2013 1703   CL 99 07/27/2024 0952   CL 107 10/07/2013 1703   CO2 26 07/27/2024 0952   CO2 25 10/07/2013 1703   BUN 25 (H) 07/27/2024 0952   BUN 43 (H) 04/06/2024 1224   BUN 10 10/07/2013 1703   CREATININE 1.27 (H) 07/27/2024 0952   CREATININE 1.20 09/29/2023 1337   CREATININE 0.77 10/07/2013 1703   GLU 188 (H) 01/01/2024 0933      Component Value Date/Time   CALCIUM   9.6 07/27/2024 0952   CALCIUM  8.9 10/07/2013 1703   ALKPHOS 72 07/09/2024 1343   ALKPHOS 83 11/12/2012 1713   AST 19 07/09/2024 1343   AST 22 09/29/2023 1337   ALT 15 07/09/2024 1343   ALT 24 09/29/2023 1337   ALT 57 11/12/2012 1713   BILITOT 1.0 07/09/2024 1343   BILITOT 1.0 04/06/2024 1224   BILITOT 1.3 (H) 09/29/2023 1337       The ASCVD Risk score (Arnett DK, et al., 2019) failed to calculate for the following reasons:   Risk score cannot be calculated because patient has a medical history suggesting prior/existing ASCVD   * - Cholesterol units were assumed  Lab Results  Component Value Date   MICRALBCREAT 43 (H) 04/02/2024   MICRALBCREAT 25 04/16/2023   MICRALBCREAT 49 (H) 02/08/2022    A/P: Diabetes currently uncontrolled with a most recent A1c of 8.2% on 08/24/24, which is down from 8.8% on 04/02/24. CGM report shows significant improvement with a GMI of 7.3%. Patient was instructed to increase both Lantus  and Humalog  doses by endocrinology, however, he was unaware of this and continued previous doses. Patient is able to verbalize appropriate hypoglycemia management plan.Libreview report shows that post-prandial hyperglycemia has improved, but fasting BG remains elevated. Will increase Lantus  at this time and maintain Novolog  at how he is currently taking this medication. Discussed possible use of Omnipod with PCP. Patient is currently managed by various specialists at this time. Discussed that it may be easier to keep DM management within the primary care space to limit the number of specialist patient is managed by, especially as he only established care with their office in 04/2024.  -Increased dose of basal insulin  Basaglar  (insulin  glargine)  55 to 60 units daily.  -Continued rapid insulin  Humalog  (insulin  lispro) from 25 units TID with meals plus sliding scale. Counseled to place 3 hours between correctional doses to avoid insulin  stacking.  -Continued GLP-1 Mounjaro   (tirzepatide )  to 15 mg weekly -Continued SGLT2-I Farxiga  (dapagliflozin )10 mg daily.  -Patient educated on purpose, proper use, and potential adverse effects of insulin  therapy.  -Extensively discussed pathophysiology of diabetes, recommended lifestyle interventions, dietary effects on blood sugar control.  -Counseled on s/sx of and management of hypoglycemia.  -Next A1c anticipated 11/2024 -Sent supplies for Omnipod to the pharmacy. Will coordinate with Omnipod training for initial training  ASCVD risk - secondary prevention in patient with diabetes. Last LDL is 94 mg/dL, not at goal of <44 mg/dL.  -Continued rosuvastatin  20 mg daily.  -Continued ezetimibe  10 mg daily  -Continued fenofibrate  145 mg daily  Written patient instructions provided. Patient verbalized understanding of treatment plan.  Total time in face to face counseling 45 minutes.  Follow-up:  Pharmacist on 10/20/24 HF cardiologist on 09/29/24 PCP on 10/27/24  Peyton CHARLENA Ferries, PharmD, BCACP, CPP Clinical Pharmacist [facility] [phone]    "

## 2024-09-15 NOTE — Patient Instructions (Addendum)
 Please call the [facility] Medicaid Contact Center at [phone].  Please increase Lantus  to 60 units daily

## 2024-09-16 ENCOUNTER — Encounter: Admitting: Emergency Medicine

## 2024-09-16 DIAGNOSIS — I5022 Chronic systolic (congestive) heart failure: Secondary | ICD-10-CM

## 2024-09-16 DIAGNOSIS — I130 Hypertensive heart and chronic kidney disease with heart failure and stage 1 through stage 4 chronic kidney disease, or unspecified chronic kidney disease: Secondary | ICD-10-CM | POA: Diagnosis not present

## 2024-09-16 NOTE — Progress Notes (Signed)
 Daily Session Note  Patient Details  Name: Gabriel [name]. MRN: [id number] Date of Birth: 1971/03/29 Referring Provider:   Flowsheet Row Pulmonary Rehab from 07/28/2024 in [facility] Cardiac and Pulmonary Rehab  Referring Provider Cherrie Sieving, MD    Encounter Date: 09/16/2024  Check In:  Session Check In - 09/16/24 0730       Check-In   Supervising physician immediately available to respond to emergencies See telemetry face sheet for immediately available ER MD    Location [facility]-Cardiac & Pulmonary Rehab    Staff Present Leita Franks RN,BSN;Joseph [facility] RCP,RRT,BSRT;Margaret Best, MS, Exercise Physiologist;Jason Elnor [facility]    Virtual Visit No    Medication changes reported     No    Fall or balance concerns reported    No    Warm-up and Cool-down Performed on first and last piece of equipment    Resistance Training Performed Yes    VAD Patient? No    PAD/SET Patient? No      Pain Assessment   Currently in Pain? No/denies             Tobacco Use History[1]  Goals Met:  Proper associated with RPD/PD & O2 Sat Independence with exercise equipment Using PLB without cueing & demonstrates good technique Exercise tolerated well No report of concerns or symptoms today Strength training completed today  Goals Unmet:  Not Applicable  Comments: Pt able to follow exercise prescription today without complaint.  Will continue to monitor for progression.    Dr. Oneil Pinal is Medical Director for [facility] Cardiac Rehabilitation.  Dr. Fuad Aleskerov is Medical Director for [facility] Pulmonary Rehabilitation.    [1]  Social History Tobacco Use  Smoking Status Former   Current packs/day: 0.00   Average packs/day: 0.5 packs/day for 33.0 years (16.5 ttl pk-yrs)   Types: Cigarettes   Start date: 05/09/1987   Quit date: 05/08/2020   Years since quitting: 4.3  Smokeless Tobacco Former   Quit date: 05/08/2020  Tobacco Comments   Quit Sept 2021

## 2024-09-17 ENCOUNTER — Encounter: Payer: Self-pay | Admitting: Cardiovascular Disease

## 2024-09-17 ENCOUNTER — Ambulatory Visit: Attending: Cardiovascular Disease | Admitting: Cardiovascular Disease

## 2024-09-17 VITALS — BP 100/62 | HR 88 | Ht 71.0 in | Wt 260.0 lb

## 2024-09-17 DIAGNOSIS — I25118 Atherosclerotic heart disease of native coronary artery with other forms of angina pectoris: Secondary | ICD-10-CM | POA: Diagnosis not present

## 2024-09-17 DIAGNOSIS — I10 Essential (primary) hypertension: Secondary | ICD-10-CM

## 2024-09-17 DIAGNOSIS — I5022 Chronic systolic (congestive) heart failure: Secondary | ICD-10-CM

## 2024-09-17 NOTE — Patient Instructions (Signed)
 Medication Instructions:  No changes  If you need a refill on your cardiac medications before your next appointment, please call your pharmacy.    Lab work: No new labs needed   Testing/Procedures: No new testing needed   Follow-Up: At [facility], you and your health needs are our priority.  As part of our continuing mission to provide you with exceptional heart care, we have created designated Provider Care Teams.  These Care Teams include your primary Cardiologist (physician) and Advanced Practice Providers (APPs -  Physician Assistants and Nurse Practitioners) who all work together to provide you with the care you need, when you need it.  You will need a follow up appointment in 6 months  Providers on your designated Care Team:   Lonni Meager, NP Bernardino Bring, PA-C Cadence Franchester, [place]  COVID-19 Vaccine Information can be found at: [email or web address] For questions related to vaccine distribution or appointments, please email [email or web address] or call [phone].

## 2024-09-17 NOTE — Progress Notes (Signed)
 Patient ID: Gabriel Corning., male   DOB: [birth year], 54 y.o.   MRN: [id number] Cardiology Office Note  Date:  09/17/2024   ID:  Gabriel LOISE Darrel Mickey., DOB Jul 08, 1971, MRN [id number]  PCP:  Gabriel Isaiah LABOR, MD   Chief Complaint  Patient presents with   6 month follow up     Patient c/o weight fluctuating 2-3 lbs most days with abdominal swelling.     HPI:  54 y.o. male with h/o  nonischemic cardiomyopathy by nuclear stress test in 2016, OSA, did not tolerate CPAP History of medication noncompliance History of chronic chest pain [facility] on 01/03/16 with cough and SOB, septic shock, right upper lobe pneumonia,  Left heart cath 01/06/2017  left ventricular ejection fraction  25-35% by visual estimate. chronic systolic CHF, Follow-up echocardiograms ejection fraction 30-35%, up to 45% - 50% with medical management COPD, long smoking history Prior runs of nonsustained VT, syncope DM2, med noncompliance, Morbid obesity Cath 05/2020: occluded RCA with collaterals He presents today for follow-up of his nonischemic cardiomyopathy, chronic systolic CHF, weakness, dehydration  Last seen by myself 7/25 Followed by advanced heart failure clinic In the hospital November 2025 with CHF On arrival BNP 2400 had small bilateral pleural effusions treated with IV Lasix  Required milrinone  augmented diuresis  In follow-up today reports he is taking torsemide  60 daily Now has access to bumetanide  0.5 mg / 0.1 mL nasal spray He finds the nasal spray very effective and takes his as needed  Weight 255 to 265 Rare 270 pounds, takes nasal spray when weight runs high Feels some abdominal fullness, does not know if this is food or fluid  Other main issue he has is nausea and dizziness that develops after working out with pulmonary rehab Reports that he gets sick on stomach when he gets home Does bike at pulmonary rehab Takes his morning medications without food before working out  On discussion of  metolazone , this is not on his medication list, he feels it is less effective than the nasal spray  Denies significant leg edema, PND orthopnea Has not required NTG for chest pain   EKG personally reviewed by myself on todays visit EKG Interpretation Date/Time:  Friday September 17 2024 10:22:43 EST Ventricular Rate:  88 PR Interval:  164 QRS Duration:  106 QT Interval:  414 QTC Calculation: 500 R Axis:   26  Text Interpretation: Normal sinus rhythm Possible Left atrial enlargement Incomplete left bundle branch block Prolonged QT When compared with ECG of 20-Jul-2024 08:58, Borderline criteria for Inferior infarct are no longer Present T wave inversion no longer evident in Inferior leads Nonspecific T wave abnormality now evident in Lateral leads Confirmed by Perla Lye [id number]) on 09/17/2024 11:26:55 AM   Other past cardiac history reviewed Admitted 05/20/22 for ICD placement and discharged the next day. Was in the ED 12/21/22 due to dizziness & chest pain.    Echo 08/14/22: EF 40-45% with mild LVH, Grade I DD, severe hypokinesis of left ventricular, entire inferior and inferolateral wall.  Echo 03/08/23: EF 45-50% with moderate LVH, trivial MR, mild dilatation of aortic root at 43 mm   RHC/ LHC 08/19/23:  Relatively stable appearance of the coronary arteries since last catheterization in 2021.  Chronically occluded proximal RCA now has antegrade flow, though I suspect this represents bridging collaterals.  Left-to-right collateral remain as well as moderate LAD and LCx disease. Mildly elevated left heart filling pressures (LVEDP 20 mmHg, PCWP 18 mmHg). Moderate pulmonary hypertension (mean PA  41 mmHg, PVR 5.3 WU). Moderately reduced Fick cardiac output/index (CO 4.3 L/min, CI 1.9 L/min/m^2).   Admitted 10/22/23 due to COPD exacerbation secondary to influenza.    Admitted 11/03/23 with dizziness, tachycardia and new onset AF. Echo 3/18 -- EF 35-40%, global LV hypokinesis, severely dilated  LV internal cavity size, mild LVH, small pericardial effusion, mild MR. IV diuresed, GDMT restarted. Successful cardioversion 11/06/23. Amiodarone  loaded.   EKG personally reviewed by myself on todays visit     PMH:   has a past medical history of Anginal pain, Aortic atherosclerosis, Aortic root dilation (11/28/2016), Arteriosclerosis of right carotid artery, Atrial fibrillation with RVR (HCC), CAD (coronary artery disease), Cardiomegaly, Cerebellar infarct (HCC), Chest wall pain, chronic, Chronic 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, Chronic Troponin Elevation, CKD (chronic kidney disease), stage II-III, COPD (chronic obstructive pulmonary disease) (HCC), CPAP (continuous positive airway pressure) dependence (03/08/2023), Depression, DM (diabetes mellitus), type 2 (HCC), Dyspnea, GERD (gastroesophageal reflux disease), Headache, HFrEF (heart failure with reduced ejection fraction) (HCC), History of 2019 novel coronavirus disease (COVID-19) (03/01/2022), History of kidney stones, History of methicillin resistant staphylococcus aureus (MRSA), Hyperlipidemia, Hypertension, 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), Left rotator cuff tear, Liver cirrhosis secondary to NASH (HCC), Long term current use of amiodarone , Long term current use of immunosuppressive drug, Long-term use of aspirin  therapy, Mediastinal lymphadenopathy, Mitral regurgitation, 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  PSH:    Past Surgical History:  Procedure  Laterality Date   CARDIOVERSION N/A 11/06/2023   Procedure: CARDIOVERSION;  Surgeon: Zenaida Morene PARAS, MD;  Location: [facility] ORS;  Service: Cardiovascular;  Laterality: N/A;   cardioverted 11/06/23     COLONOSCOPY WITH PROPOFOL  N/A 08/01/2022   Procedure: COLONOSCOPY WITH PROPOFOL ;  Surgeon: Unk Corinn Skiff, MD;  Location: [facility] ENDOSCOPY;  Service: Gastroenterology;  Laterality: N/A;   ESOPHAGOGASTRODUODENOSCOPY (EGD) WITH PROPOFOL  N/A 08/01/2022   Procedure: ESOPHAGOGASTRODUODENOSCOPY (EGD) WITH PROPOFOL ;  Surgeon: Unk Corinn Skiff, MD;  Location: [facility] ENDOSCOPY;  Service: Gastroenterology;  Laterality: N/A;   ESOPHAGOGASTRODUODENOSCOPY (EGD) WITH PROPOFOL  N/A 10/16/2022   Procedure: ESOPHAGOGASTRODUODENOSCOPY (EGD) WITH PROPOFOL ;  Surgeon: Unk Corinn Skiff, MD;  Location: [facility] ENDOSCOPY;  Service: Gastroenterology;  Laterality: N/A;   FINGER AMPUTATION Left    Traumatic   LEFT HEART CATH AND CORONARY ANGIOGRAPHY N/A 01/06/2017   Procedure: Left Heart Cath and Coronary Angiography;  Surgeon: Darron Deatrice LABOR, MD;  Location: [facility] INVASIVE CV LAB;  Service: Cardiovascular;  Laterality: N/A;   LESION EXCISION N/A 02/16/2024   Procedure: EXCISION, LESION, SCALP;  Surgeon: Rodolph Romano, MD;  Location: [facility] ORS;  Service: General;  Laterality: N/A;   RIGHT HEART CATH N/A 06/23/2024   Procedure: RIGHT HEART CATH;  Surgeon: Cherrie Toribio SAUNDERS, MD;  Location: [facility] INVASIVE CV LAB;  Service: Cardiovascular;  Laterality: N/A;   RIGHT HEART CATH N/A 07/13/2024   Procedure: RIGHT HEART CATH;  Surgeon: Mady Bruckner, MD;  Location: [facility] INVASIVE CV LAB;  Service: Cardiovascular;  Laterality: N/A;   RIGHT/LEFT HEART CATH AND CORONARY ANGIOGRAPHY N/A 06/13/2020   Procedure: RIGHT/LEFT HEART CATH AND CORONARY ANGIOGRAPHY;  Surgeon: Mady Bruckner, MD;  Location: [facility] INVASIVE CV LAB;  Service: Cardiovascular;  Laterality: N/A;   RIGHT/LEFT HEART CATH AND CORONARY ANGIOGRAPHY  Bilateral  08/19/2023   Procedure: RIGHT/LEFT HEART CATH AND CORONARY ANGIOGRAPHY;  Surgeon: Mady Bruckner, MD;  Location: [facility] INVASIVE CV LAB;  Service: Cardiovascular;  Laterality: Bilateral;   SHOULDER ARTHROSCOPY WITH SUBACROMIAL DECOMPRESSION, ROTATOR CUFF REPAIR AND BICEP TENDON REPAIR Left 09/16/2023   Procedure: SHOULDER ARTHROSCOPY WITH DEBRIDEMENT, DECOMPRESSION, ROTATOR CUFF REPAIR AND BICEPS TENODESIS.;  Surgeon: [name] Norleen PARAS, MD;  Location: [facility] ORS;  Service: Orthopedics;  Laterality: Left;   SUBQ ICD IMPLANT N/A 05/20/2022   Procedure: SUBQ ICD IMPLANT;  Surgeon: Cindie Ole DASEN, MD;  Location[facility] INVASIVE CV LAB;  Service: Cardiovascular;  Laterality: N/A;    Current Outpatient Medications  Medication Sig Dispense Refill   Accu-Chek Softclix Lancets lancets USE UP TO FOUR TIMES DAILY     albuterol  (VENTOLIN  HFA) 108 (90 Base) MCG/ACT inhaler Inhale 2 puffs into the lungs every 4 (four) hours as needed for wheezing or shortness of breath. 8 g 6   allopurinol  (ZYLOPRIM ) 300 MG tablet Take 300 mg by mouth every morning.     amiodarone  (PACERONE ) 200 MG tablet Take 1 tablet (200 mg total) by mouth daily. 90 tablet 3   BD PEN NEEDLE NANO 2ND GEN 32G X 4 MM MISC USE  AS DIRECTED AT BEDTIME 200 each 0   blood glucose meter kit and supplies KIT Dispense based on patient and insurance preference. Use up to four times daily as directed. 1 each 11   Bumetanide  (ENBUMYST ) 0.5 MG/0.1ML SOLN Place 1 mg into the nose daily as needed (for weight gain with shortness of breath and swelling). 24 each 1   carvedilol  (COREG ) 3.125 MG tablet Take 1 tablet (3.125 mg total) by mouth 2 (two) times daily with a meal. 60 tablet 3   Continuous Glucose Sensor (FREESTYLE LIBRE 2 PLUS SENSOR) MISC Change sensor every 15 days. 6 each 3   dapagliflozin  propanediol (FARXIGA ) 10 MG TABS tablet Take 1 tablet (10 mg total) by mouth daily. 90 tablet 3   Digoxin  62.5 MCG TABS Take 0.0625 mg by mouth daily. 30 tablet 5    ezetimibe  (ZETIA ) 10 MG tablet Take 1 tablet (10 mg total) by mouth daily. 90 tablet 3   hydrALAZINE  (APRESOLINE ) 25 MG tablet Take 1 tablet (25 mg total) by mouth 2 (two) times daily.     insulin  glargine (LANTUS  SOLOSTAR) 100 UNIT/ML Solostar Pen Inject 55 Units into the skin daily. Decrease by 2 units every 2 days if fasting blood glucose is less than 90. 15 mL 11   insulin  lispro (HUMALOG ) 100 UNIT/ML KwikPen Inject 20 Units into the skin 3 (three) times daily. + CORRECTION BEFORE BREAKFAST/LUNCH AND SUPPER (CORRECTION = 1 UNIT PER 50 MG/DL ABOVE 849); MAX DAILY DOSE 77 UNITS 15 mL 11   ipratropium (ATROVENT ) 0.06 % nasal spray Place 2 sprays into both nostrils every 8 (eight) hours.     ipratropium-albuterol  (DUONEB) 0.5-2.5 (3) MG/3ML SOLN Take 3 mLs by nebulization every 6 (six) hours as needed. 360 mL 11   meclizine  (ANTIVERT ) 25 MG tablet Take 0.5 tablets (12.5 mg total) by mouth 3 (three) times daily as needed for dizziness.     nitroGLYCERIN  (NITROSTAT ) 0.4 MG SL tablet Place 1 tablet (0.4 mg total) under the tongue every 5 (five) minutes x 3 doses as needed for chest pain. 30 tablet 3   omeprazole  (PRILOSEC) 40 MG capsule Take 1 capsule (40 mg total) by mouth daily. 90 capsule 2   potassium chloride  SA (KLOR-CON  M) 20 MEQ tablet Take  1 tablet (20 mEq total) by mouth daily. AM 90 tablet 3   rivaroxaban  (XARELTO ) 20 MG TABS tablet Take 1 tablet (20 mg total) by mouth daily with supper. 90 tablet 3   rosuvastatin  (CRESTOR ) 20 MG tablet Take 1 tablet (20 mg total) by mouth daily. 90 tablet 3   sacubitril -valsartan  (ENTRESTO ) 24-26 MG Take 1 tablet by mouth 2 (two) times daily.     tirzepatide  (MOUNJARO ) 15 MG/0.5ML Pen Inject 15 mg into the skin once a week. 6 mL 3   Torsemide  60 MG TABS Take 60 mg by mouth daily. 90 tablet 0   Vericiguat (VERQUVO) 2.5 MG TABS Take 1 tablet (2.5 mg total) by mouth daily. 30 tablet 5   Insulin  Disposable Pump (OMNIPOD 5 LIBRE2 G6 INTRO GEN5) KIT Change pod  every 2-3 days. (Patient not taking: Reported on 09/17/2024) 1 kit 0   Insulin  Disposable Pump (OMNIPOD 5 LIBRE2 PLUS G6 PODS) MISC Change pod every 2-3 days. (Patient not taking: Reported on 09/17/2024) 3 each 5   insulin  lispro (HUMALOG ) 100 UNIT/ML injection Inject up to 200 units into the Omnipod insulin  pump every 2 days (Patient not taking: Reported on 09/17/2024) 90 mL 2   Insulin  Pen Needle 32G X 4 MM MISC USE WITH INSULIN  IN THE MORNING, AT NOON, IN THE EVENING AND AT BEDTIME 400 each 11   No current facility-administered medications for this visit.    Allergies:   Metformin  and related, Ranitidine , and Zantac  [ranitidine  hcl]   Social History:  The patient  reports that he has been smoking Cigarettes.  He has been smoking about 2.00 packs per day for the past 33 years. He has never used smokeless tobacco. He reports that he drinks about 1.8 oz of alcohol per week. He reports that he does not use illicit drugs.   Family History:   family history includes Cancer in his maternal grandfather and paternal aunt; Diabetes in his maternal grandfather and mother; Diabetes Mellitus II in his mother; Gout in his father; Heart attack in his mother; Hypertension in his father and mother; Hypothyroidism in his mother; Kidney failure in his mother.   Review of Systems: Review of Systems  Constitutional: Negative.   HENT: Negative.    Respiratory: Negative.    Cardiovascular: Negative.   Gastrointestinal: Negative.   Musculoskeletal: Negative.   Psychiatric/Behavioral: Negative.    All other systems reviewed and are negative.  PHYSICAL EXAM: VS:  BP 100/62 (BP Location: Left Arm, Patient Position: Sitting, Cuff Size: Normal)   Pulse 88   Ht 5' 11 (1.803 m)   Wt 260 lb (117.9 kg)   SpO2 92%   BMI 36.26 kg/m  , BMI Body mass index is 36.26 kg/m. Constitutional:  oriented to person, place, and time. No distress.  HENT:  Head: Grossly normal Eyes:  no discharge. No scleral icterus.  Neck:  No JVD, no carotid bruits  Cardiovascular: Regular rate and rhythm, no murmurs appreciated Pulmonary/Chest: Clear to auscultation bilaterally, no wheezes or rails Abdominal: Soft.  no distension.  no tenderness.  Musculoskeletal: Normal range of motion Neurological:  normal muscle tone. Coordination normal. No atrophy Skin: Skin warm and dry Psychiatric: normal affect, pleasant  Recent Labs: 04/06/2024: TSH 1.600 06/28/2024: B Natriuretic Peptide 143.3 07/09/2024: ALT 15 07/14/2024: Magnesium  2.1 07/27/2024: BUN 25; Creatinine, Ser 1.27; Potassium 3.8; Pro Brain Natriuretic Peptide 1,328.0; Sodium 138 07/28/2024: Hemoglobin 16.3; Platelets 165   Lipid Panel Lab Results  Component Value Date   CHOL 180 01/01/2024  HDL 29 (L) 01/01/2024   LDLCALC 94 01/01/2024   TRIG 341 (H) 01/01/2024      Wt Readings from Last 3 Encounters:  09/17/24 260 lb (117.9 kg)  09/02/24 268 lb 6.4 oz (121.7 kg)  08/26/24 262 lb 3.2 oz (118.9 kg)     ASSESSMENT AND PLAN: Cardiomyopathy (HCC) - Mixed ischemic and nonischemic Catheterization 2024 showing occluded RCA with collaterals from left to right -Currently with no symptoms of angina. No further workup at this time. Continue current medication regimen.  Essential hypertension Blood pressure is well controlled on today's visit. No changes made to the medications.  Chronic systolic heart failure (HCC) No medication changes made Weight trending down on Mounjaro  Appreciate his convenience of loop diuretic nasal spray which seems to be effective for him when he adds it with his torsemide  -Recent BNP trending upward Recommend he stay on torsemide  60 daily, use nasal spray for weight 260 pounds or higher, goal weight less than 260 pounds  Morbid obesity Weight stable on Mounjaro     Orders Placed This Encounter  Procedures   EKG 12-Lead     Signed, Velinda Lunger, M.D., Ph.D. 09/17/2024  [facility] [place],  [place] [phone]

## 2024-09-21 ENCOUNTER — Encounter

## 2024-09-21 DIAGNOSIS — I5022 Chronic systolic (congestive) heart failure: Secondary | ICD-10-CM

## 2024-09-21 NOTE — Progress Notes (Signed)
 Daily Session Note  Patient Details  Name: Gabriel [name]. MRN: [id number] Date of Birth: 10-05-1970 Referring Provider:   Flowsheet Row Pulmonary Rehab from 07/28/2024 in [facility] Cardiac and Pulmonary Rehab  Referring Provider Cherrie Sieving, MD    Encounter Date: 09/21/2024  Check In:  Session Check In - 09/21/24 1052       Check-In   Supervising physician immediately available to respond to emergencies See telemetry face sheet for immediately available ER MD    Location [facility]-Cardiac & Pulmonary Rehab    Staff Present Burnard Davenport RN,BSN,MPA;Maxon Burnell BS, Exercise Physiologist;Meredith Tressa RN,BSN;Noah Tickle, BS, Exercise Physiologist    Virtual Visit No    Medication changes reported     No    Fall or balance concerns reported    No    Warm-up and Cool-down Performed on first and last piece of equipment    Resistance Training Performed Yes    VAD Patient? No    PAD/SET Patient? No      Pain Assessment   Currently in Pain? No/denies             Tobacco Use History[1]  Goals Met:  Proper associated with RPD/PD & O2 Sat Independence with exercise equipment Using PLB without cueing & demonstrates good technique Exercise tolerated well No report of concerns or symptoms today Strength training completed today  Goals Unmet:  Not Applicable  Comments: Pt able to follow exercise prescription today without complaint.  Will continue to monitor for progression.    Dr. Oneil Pinal is Medical Director for [facility] Cardiac Rehabilitation.  Dr. Fuad Aleskerov is Medical Director for [facility] Pulmonary Rehabilitation.    [1]  Social History Tobacco Use  Smoking Status Former   Current packs/day: 0.00   Average packs/day: 0.5 packs/day for 33.0 years (16.5 ttl pk-yrs)   Types: Cigarettes   Start date: 05/09/1987   Quit date: 05/08/2020   Years since quitting: 4.3  Smokeless Tobacco Former   Quit date: 05/08/2020  Tobacco Comments   Quit Sept 2021

## 2024-09-22 ENCOUNTER — Ambulatory Visit

## 2024-09-22 ENCOUNTER — Ambulatory Visit: Attending: Sleep Medicine

## 2024-09-22 DIAGNOSIS — L4050 Arthropathic psoriasis, unspecified: Secondary | ICD-10-CM

## 2024-09-22 DIAGNOSIS — L219 Seborrheic dermatitis, unspecified: Secondary | ICD-10-CM | POA: Diagnosis not present

## 2024-09-22 DIAGNOSIS — L409 Psoriasis, unspecified: Secondary | ICD-10-CM

## 2024-09-22 DIAGNOSIS — L408 Other psoriasis: Secondary | ICD-10-CM

## 2024-09-22 MED ORDER — ZORYVE 0.3 % EX FOAM: 1.0000 | Freq: Every day | CUTANEOUS | 5 refills | Status: DC

## 2024-09-22 MED ORDER — ZORYVE 0.3 % EX FOAM: 1.0000 | Freq: Every day | CUTANEOUS | 5 refills | Status: AC

## 2024-09-22 MED ORDER — TACROLIMUS 0.1 % EX OINT: TOPICAL_OINTMENT | CUTANEOUS | 5 refills | Status: AC

## 2024-09-22 MED ORDER — KETOCONAZOLE 2 % EX SHAM: MEDICATED_SHAMPOO | CUTANEOUS | 5 refills | Status: DC

## 2024-09-22 MED ORDER — KETOCONAZOLE 2 % EX CREA: TOPICAL_CREAM | CUTANEOUS | 5 refills | Status: AC

## 2024-09-22 MED ORDER — CLOBETASOL PROPIONATE 0.05 % EX SOLN: CUTANEOUS | 5 refills | Status: AC

## 2024-09-22 MED ORDER — KETOCONAZOLE 2 % EX SHAM: MEDICATED_SHAMPOO | CUTANEOUS | 5 refills | Status: AC

## 2024-09-22 MED ORDER — CLOBETASOL PROPIONATE 0.05 % EX OINT: TOPICAL_OINTMENT | CUTANEOUS | 5 refills | Status: AC

## 2024-09-22 NOTE — Patient Instructions (Addendum)
 For face and scalp:  - Start ketoconazole  shampoo 2% three times weekly Lather into hair and leave for 5-10 minutes before washing out - This medication is also available in a cream if that is preferred and was sent to your pharmacy  - Start tacrolimus  ointment for spots on face  - start clobetasol  solution for scalp - if approved can just use zoryve  foam instead of the tacrolimus  and clobetasol  solution   For spots on body - clobetasol  ointment bid    1. Alternate over the counter dandruff shampoos. Examples include T-gel, T-sal, selsun blue, head and shoulders - Line up 2-3 shampoos in the shower, use one, move it to the back of the line, then the next time you shower use the next shampoo - Lather shampoo onto the scalp and allow to soak for for 5-10 min prior to rinsing - Do this all of the time, even when your dandruff is under control  2. Use the topical steroid   as needed when your scalp is itching and/or flaky/rashy  - stop once your scalp is under control - restart as needed     Seborrheic Dermatitis: Care Instructions Your Care Instructions Seborrheic dermatitis (say seh-buh-REE-ick der-muh-TY-tus) is a skin problem that causes a reddish rash with greasy, flaky, yellow skin patches. The rash may appear on many parts of the body. It may be on the scalp, face (especially the eyebrow area and between the nose and mouth), ears, breasts, underarms, and genital area. The flaky skin on the scalp is called dandruff. This rash is often a long-term (chronic) condition. It may last for years. But the symptoms may come and go. Symptoms can be treated with special creams, shampoos, or other skin care. The cause of seborrheic dermatitis is not fully understood. It may occur when skin glands make too much oil. It may get worse in cold weather or with stress. A type of skin fungus, or yeast, may also be linked with this condition. Follow-up care is a key part of your treatment and safety.  Be sure to make and go to all appointments, and call your doctor if you are having problems. It's also a good idea to know your test results and keep a list of the medicines you take. How can you care for yourself at home? If your doctor prescribes a steroid cream, dandruff shampoo, or antifungal cream or medicine, use it as directed. If your doctor prescribes other medicine, take it as directed. Use a dandruff shampoo if seborrheic dermatitis affects your scalp. This includes Head & Shoulders, Sebulex, and Selsun Blue. You may need to try a few kinds of shampoo to find the one that works best for you. To help with itching: Use hydrocortisone cream. Follow the directions on the label. Use cold, wet cloths. Take an over-the-counter antihistamine, such as diphenhydramine (Benadryl) or loratadine  (Claritin ). Read and follow all instructions on the label.  Where can you learn more? Go to [facility] at https://myuncchart.org Select Patient Education under [employer]. Enter [phone] in the search box to learn more about Seborrheic Dermatitis: Care Instructions. Current as of: February 18, 2019                 2006-2020 [employer].  Care instructions adapted under license by [facility]. If you have questions about a medical condition or this instruction, always ask your healthcare professional. [employer], Incorporated disclaims any warranty or liability for your use of this information.   [facility] Your prescription  was sent to [facility] in [place]. A representative from [facility] will contact you within 3 business hours to verify your address and insurance information to schedule a free delivery. If for any reason you do not receive a phone call from them, please reach out to them. Their phone number is [phone] and their hours are Monday-Friday 9:00 am-5:00 pm.      Due to recent changes in healthcare laws, you may see results of your pathology and/or  laboratory studies on MyChart before the doctors have had a chance to review them. We understand that in some cases there may be results that are confusing or concerning to you. Please understand that not all results are received at the same time and often the doctors may need to interpret multiple results in order to provide you with the best plan of care or course of treatment. Therefore, we ask that you please give us  2 business days to thoroughly review all your results before contacting the office for clarification. Should we see a critical lab result, you will be contacted sooner.   If You Need Anything After Your Visit  If you have any questions or concerns for your doctor, please call our main line at [phone] and press option 4 to reach your doctor's medical assistant. If no one answers, please leave a voicemail as directed and we will return your call as soon as possible. Messages left after 4 pm will be answered the following business day.   You may also send us  a message via MyChart. We typically respond to MyChart messages within 1-2 business days.  For prescription refills, please ask your pharmacy to contact our office. Our fax number is [phone].  If you have an urgent issue when the clinic is closed that cannot wait until the next business day, you can page your doctor at the number below.    Please note that while we do our best to be available for urgent issues outside of office hours, we are not available 24/7.   If you have an urgent issue and are unable to reach us , you may choose to seek medical care at your doctor's office, retail clinic, urgent care center, or emergency room.  If you have a medical emergency, please immediately call 911 or go to the emergency department.  Pager Numbers  - Dr. Hester: [phone]  - Dr. Jackquline: [phone]  - Dr. Claudene: [phone]   - Dr. Raymund: [phone]  In the event of inclement weather, please call our main  line at [phone] for an update on the status of any delays or closures.  Dermatology Medication Tips: Please keep the boxes that topical medications come in in order to help keep track of the instructions about where and how to use these. Pharmacies typically print the medication instructions only on the boxes and not directly on the medication tubes.   If your medication is too expensive, please contact our office at [phone] option 4 or send us  a message through MyChart.   We are unable to tell what your co-pay for medications will be in advance as this is different depending on your insurance coverage. However, we may be able to find a substitute medication at lower cost or fill out paperwork to get insurance to cover a needed medication.   If a prior authorization is required to get your medication covered by your insurance company, please allow us  1-2 business days to complete this process.  Drug prices often vary depending  on where the prescription is filled and some pharmacies may offer cheaper prices.  The website www.goodrx.com contains coupons for medications through different pharmacies. The prices here do not account for what the cost may be with help from insurance (it may be cheaper with your insurance), but the website can give you the price if you did not use any insurance.  - You can print the associated coupon and take it with your prescription to the pharmacy.  - You may also stop by our office during regular business hours and pick up a GoodRx coupon card.  - If you need your prescription sent electronically to a different pharmacy, notify our office through [facility] or by phone at [phone] option 4.     Si Usted Necesita Algo Despus de Su Visita  Tambin puede enviarnos un mensaje a travs de Clinical Cytogeneticist. Por lo general respondemos a los mensajes de MyChart en el transcurso de 1 a 2 das hbiles.  Para renovar recetas, por favor pida a su farmacia que  se ponga en contacto con nuestra oficina. Randi lakes de fax es [place] [phone].  Si tiene un asunto urgente cuando la clnica est cerrada y que no puede esperar hasta el siguiente da hbil, puede llamar/localizar a su doctor(a) al nmero que aparece a continuacin.   Por favor, tenga en cuenta que aunque hacemos todo lo posible para estar disponibles para asuntos urgentes fuera del horario de [place], no estamos disponibles las 24 horas del da, los 7 [address]  [address] de la [place].   Si tiene un problema urgente y no puede comunicarse con nosotros, puede optar por buscar atencin mdica  en el consultorio de su doctor(a), en una clnica privada, en un centro de atencin urgente o en una sala de emergencias.  Si tiene engineer, drilling, por favor llame inmediatamente al 911 o vaya a la sala de emergencias.  Nmeros de bper  - Dr. Hester: [phone]  - Dra. Jackquline: [phone]  - Dr. Claudene: [phone]  - Dra. Kitts: [phone]  En caso de inclemencias del [place], por favor llame a nuestra lnea principal al [phone] para una actualizacin sobre el estado de cualquier retraso o cierre.  Consejos para la medicacin en dermatologa: Por favor, guarde las cajas en las que vienen los medicamentos de uso tpico para ayudarle a seguir las instrucciones sobre dnde y cmo usarlos. Las farmacias generalmente imprimen las instrucciones del medicamento slo en las cajas y no directamente en los tubos del [place].   Si su medicamento es muy caro, por favor, pngase en contacto con landry rieger llamando al [phone] y presione la opcin 4 o envenos un mensaje a travs de Clinical Cytogeneticist.   No podemos decirle cul ser su copago por los medicamentos por adelantado ya que esto es diferente dependiendo de la cobertura de su seguro. Sin embargo, es posible que podamos encontrar un medicamento sustituto a audiological scientist un formulario para que el seguro cubra el medicamento que se  considera necesario.   Si se requiere una autorizacin previa para que su compaa de seguros cubra su medicamento, por favor permtanos de 1 a 2 das hbiles para completar este proceso.  Los precios de los medicamentos varan con frecuencia dependiendo del environmental consultant de dnde se surte la receta y alguna farmacias pueden ofrecer precios ms baratos.  El sitio web www.goodrx.com tiene cupones para medicamentos de health and safety inspector. Los precios aqu no tienen en cuenta lo que podra costar con la ayuda del seguro (puede ser ms  barato con su seguro), pero el sitio web puede darle el precio si no visual merchandiser.  - Puede imprimir el cupn correspondiente y llevarlo con su receta a la farmacia.  - Tambin puede pasar por nuestra oficina durante el horario de atencin regular y education officer, museum una tarjeta de cupones de GoodRx.  - Si necesita que su receta se enve electrnicamente a una farmacia diferente, informe a nuestra oficina a travs de MyChart de  o por telfono llamando al [phone] y presione la opcin 4.

## 2024-09-22 NOTE — Progress Notes (Signed)
 "   Subjective   Gabriel [name]. is a 54 y.o. male who presents for the following: Lesion(s) of concern . Patient is established patient   Today patient reports: Patient states LOC on face and elbows. Has been present for years. Itchy and bleeds.  Has history of psoriasis and PsA. Previously followed with Rheumatology and was clear on taltz. Off Taltz since September and flaring. Has appt with Rheum next week   Review of Systems:    No other skin or systemic complaints except as noted in HPI or Assessment and Plan.  The following portions of the chart were reviewed this encounter and updated as appropriate: medications, allergies, medical history  Relevant Medical History:  Personal hx of psoriasis  Objective  (SKPE) Well appearing patient in no apparent distress; mood and affect are within normal limits. Examination was performed of the: Focused Exam of: face, bilateral arms, back   Examination notable for: Psoriasis: Well circumscribed erythematous papules and plaques with overlying silvery scale on flexural surfaces including elbows, knees, gluteal cleft , Seborrheic Dermatitis: Erythema and scaling of the scalp, ear canals, and central face > rest of face.  Examination limited by: Undergarments, Shoes or socks , and Clothing     Assessment & Plan  (SKAP)   Psoriasis/Sebopsoriasis of face/scalp  Psoriasis with PsA - BSA 5%, flaring  Chronic and persistent condition with duration or expected duration over one year. Condition is symptomatic and bothersome to patient. Patient is flaring and not currently at treatment goal.  - Educated, discussed chronic nature and waxing/waning. - Discussed association with psoriatic arthritis, monitor for increasing joint pain/stiffness, red/hot swollen fingers or joints; discussed if develops joint involvement will need referral to Rheumatology and possible escalation of therapy  - Discussed re initiation of biologic - states has appt with  Rheumatology next week and will discuss then   - Start Clobetasol  ointment 0.05% twice daily for 2 weeks to thick plaques on body. Can similarly retreat for flares. - Start Clobetasol  0.05% solution daily for scalp involvement  - Discussed risks including possibility of steroid atrophy with long-term usage - Advised patient to follow up with rheumatology for injectable medications. Start roflumilast  foam 0.3% twice daily to affected area, scalp and face. - Start ketoconazole  2% shampoo TIW, apply to scalp and affected areas of face/body and allow the shampoo to sit for 5-10 minutes before rinsing off  - - Also provided  ketoconazole  cream bid to affected areas      Was sun protection counseling provided?: No   Level of service outlined above   Patient instructions (SKPI)   Procedures, orders, diagnosis for this visit:  PSORIASIS   PSORIATIC ARTHRITIS (HCC)   SEBORRHEIC DERMATITIS    Psoriasis  Psoriatic arthritis (HCC)  Seborrheic dermatitis  Other orders -     Zoryve ; Apply 1 Pump topically daily. Apply 2 grams twice daily to affected areas of skin  Dispense: 60 g; Refill: 5 -     Tacrolimus ; Apply 2 grams twice daily to affected areas of skin  Dispense: 100 g; Refill: 5 -     Clobetasol  Propionate; Apply 1 gram topically to affected area of skin twice daily. Stop once resolved and restart as needed for flares. Avoid use on face, armpits, groin unless otherwise indicated.  Dispense: 60 g; Refill: 5 -     Ketoconazole ; Apply to affected area of skin twice daily  Dispense: 30 g; Refill: 5 -     Ketoconazole ; Apply to scalp  as a shampoo 2-3 times weekly. Let sit for 5 minutes before rinsing.  Dispense: 120 mL; Refill: 5 -     Clobetasol  Propionate; Apply 1 mL to affected areas of skin twice daily  Dispense: 50 mL; Refill: 5    Return to clinic: Return in about 6 weeks (around 11/03/2024) for psoraisis f/u , w/ Dr. Raymund.  I, Almetta Nora, RMA, am acting as scribe for  Lauraine JAYSON Raymund, MD .   Documentation: I have reviewed the above documentation for accuracy and completeness, and I agree with the above.  Lauraine JAYSON Raymund, MD  "

## 2024-09-23 ENCOUNTER — Encounter

## 2024-09-24 ENCOUNTER — Telehealth: Payer: Self-pay

## 2024-09-24 DIAGNOSIS — J449 Chronic obstructive pulmonary disease, unspecified: Secondary | ICD-10-CM

## 2024-09-24 MED ORDER — IPRATROPIUM-ALBUTEROL 0.5-2.5 (3) MG/3ML IN SOLN: 3.0000 mL | Freq: Four times a day (QID) | RESPIRATORY_TRACT | 2 refills | Status: AC | PRN

## 2024-09-24 NOTE — Telephone Encounter (Signed)
 Brief Telephone Documentation Reason for Call: Patient left message regarding question for pharmacist about CGM sensors  Summary of Call: Per pharmacy, it is too soon to fill Freestyle Libre 2+ sensors on insurance for another week. Will hopefully have samples on site to provide during his Omnipod pod pump training on 09/29/24.  Patient also requested a refill of DuoNeb. Will route request to PCP.   Follow Up: Patient given direct line for further questions/concerns.  Ranesha Val E. Marsh, PharmD, BCACP, CPP Clinical Pharmacist [facility] [phone]

## 2024-09-28 ENCOUNTER — Encounter

## 2024-09-29 ENCOUNTER — Ambulatory Visit

## 2024-09-29 ENCOUNTER — Ambulatory Visit: Admitting: Family

## 2024-09-30 ENCOUNTER — Encounter

## 2024-10-05 ENCOUNTER — Encounter

## 2024-10-07 ENCOUNTER — Encounter

## 2024-10-12 ENCOUNTER — Encounter

## 2024-10-14 ENCOUNTER — Encounter

## 2024-10-19 ENCOUNTER — Encounter

## 2024-10-20 ENCOUNTER — Ambulatory Visit

## 2024-10-21 ENCOUNTER — Encounter

## 2024-10-26 ENCOUNTER — Encounter

## 2024-10-27 ENCOUNTER — Ambulatory Visit

## 2024-10-28 ENCOUNTER — Encounter

## 2024-11-01 ENCOUNTER — Inpatient Hospital Stay

## 2024-11-01 ENCOUNTER — Inpatient Hospital Stay: Admitting: Oncology

## 2024-11-02 ENCOUNTER — Ambulatory Visit

## 2024-11-02 ENCOUNTER — Encounter

## 2024-11-03 ENCOUNTER — Inpatient Hospital Stay

## 2024-11-03 ENCOUNTER — Inpatient Hospital Stay: Admitting: Oncology

## 2024-11-04 ENCOUNTER — Ambulatory Visit

## 2024-11-04 ENCOUNTER — Encounter

## 2024-11-09 ENCOUNTER — Encounter

## 2024-11-11 ENCOUNTER — Encounter

## 2024-11-16 ENCOUNTER — Encounter

## 2024-11-17 ENCOUNTER — Encounter: Admitting: Cardiology

## 2024-11-18 ENCOUNTER — Encounter

## 2024-11-22 ENCOUNTER — Ambulatory Visit: Admitting: Cardiovascular Disease

## 2024-11-23 ENCOUNTER — Encounter

## 2024-11-25 ENCOUNTER — Encounter
